# Patient Record
Sex: Male | Born: 1945 | Hispanic: No | Smoking: Current some day smoker
Health system: Southern US, Community
[De-identification: ages and names within clinical notes are randomized; demographics above are authoritative.]

## PROBLEM LIST (undated history)

## (undated) DIAGNOSIS — F32A Depression, unspecified: Secondary | ICD-10-CM

## (undated) DIAGNOSIS — N183 Chronic kidney disease, stage 3 unspecified: Secondary | ICD-10-CM

## (undated) DIAGNOSIS — K56609 Unspecified intestinal obstruction, unspecified as to partial versus complete obstruction: Secondary | ICD-10-CM

## (undated) DIAGNOSIS — I8290 Acute embolism and thrombosis of unspecified vein: Secondary | ICD-10-CM

## (undated) DIAGNOSIS — E0590 Thyrotoxicosis, unspecified without thyrotoxic crisis or storm: Secondary | ICD-10-CM

## (undated) DIAGNOSIS — E46 Unspecified protein-calorie malnutrition: Secondary | ICD-10-CM

## (undated) DIAGNOSIS — Z91199 Patient's noncompliance with other medical treatment and regimen due to unspecified reason: Secondary | ICD-10-CM

## (undated) DIAGNOSIS — M87051 Idiopathic aseptic necrosis of right femur: Secondary | ICD-10-CM

## (undated) DIAGNOSIS — R7881 Bacteremia: Secondary | ICD-10-CM

## (undated) DIAGNOSIS — E785 Hyperlipidemia, unspecified: Secondary | ICD-10-CM

## (undated) DIAGNOSIS — I4821 Permanent atrial fibrillation: Secondary | ICD-10-CM

## (undated) DIAGNOSIS — I4891 Unspecified atrial fibrillation: Secondary | ICD-10-CM

## (undated) DIAGNOSIS — F319 Bipolar disorder, unspecified: Secondary | ICD-10-CM

## (undated) DIAGNOSIS — Z9119 Patient's noncompliance with other medical treatment and regimen: Secondary | ICD-10-CM

## (undated) DIAGNOSIS — I10 Essential (primary) hypertension: Secondary | ICD-10-CM

## (undated) DIAGNOSIS — N529 Male erectile dysfunction, unspecified: Secondary | ICD-10-CM

## (undated) DIAGNOSIS — F419 Anxiety disorder, unspecified: Secondary | ICD-10-CM

## (undated) DIAGNOSIS — R6251 Failure to thrive (child): Secondary | ICD-10-CM

## (undated) DIAGNOSIS — D696 Thrombocytopenia, unspecified: Secondary | ICD-10-CM

## (undated) DIAGNOSIS — J449 Chronic obstructive pulmonary disease, unspecified: Secondary | ICD-10-CM

## (undated) DIAGNOSIS — I428 Other cardiomyopathies: Secondary | ICD-10-CM

## (undated) DIAGNOSIS — R911 Solitary pulmonary nodule: Secondary | ICD-10-CM

## (undated) DIAGNOSIS — Z77090 Contact with and (suspected) exposure to asbestos: Secondary | ICD-10-CM

## (undated) DIAGNOSIS — N433 Hydrocele, unspecified: Secondary | ICD-10-CM

## (undated) DIAGNOSIS — J189 Pneumonia, unspecified organism: Secondary | ICD-10-CM

## (undated) DIAGNOSIS — Z9981 Dependence on supplemental oxygen: Secondary | ICD-10-CM

## (undated) DIAGNOSIS — K219 Gastro-esophageal reflux disease without esophagitis: Secondary | ICD-10-CM

## (undated) DIAGNOSIS — R634 Abnormal weight loss: Secondary | ICD-10-CM

## (undated) DIAGNOSIS — I5022 Chronic systolic (congestive) heart failure: Secondary | ICD-10-CM

## (undated) DIAGNOSIS — J929 Pleural plaque without asbestos: Secondary | ICD-10-CM

## (undated) DIAGNOSIS — B9620 Unspecified Escherichia coli [E. coli] as the cause of diseases classified elsewhere: Secondary | ICD-10-CM

## (undated) DIAGNOSIS — I82409 Acute embolism and thrombosis of unspecified deep veins of unspecified lower extremity: Secondary | ICD-10-CM

## (undated) DIAGNOSIS — M87052 Idiopathic aseptic necrosis of left femur: Secondary | ICD-10-CM

## (undated) DIAGNOSIS — F1010 Alcohol abuse, uncomplicated: Secondary | ICD-10-CM

## (undated) DIAGNOSIS — M479 Spondylosis, unspecified: Secondary | ICD-10-CM

## (undated) DIAGNOSIS — I495 Sick sinus syndrome: Secondary | ICD-10-CM

## (undated) DIAGNOSIS — I213 ST elevation (STEMI) myocardial infarction of unspecified site: Secondary | ICD-10-CM

## (undated) DIAGNOSIS — F329 Major depressive disorder, single episode, unspecified: Secondary | ICD-10-CM

## (undated) DIAGNOSIS — M1990 Unspecified osteoarthritis, unspecified site: Secondary | ICD-10-CM

## (undated) DIAGNOSIS — K2970 Gastritis, unspecified, without bleeding: Secondary | ICD-10-CM

## (undated) DIAGNOSIS — I81 Portal vein thrombosis: Secondary | ICD-10-CM

## (undated) HISTORY — DX: Bacteremia: R78.81

## (undated) HISTORY — DX: Chronic obstructive pulmonary disease, unspecified: J44.9

## (undated) HISTORY — DX: Idiopathic aseptic necrosis of left femur: M87.052

## (undated) HISTORY — DX: Chronic systolic (congestive) heart failure: I50.22

## (undated) HISTORY — DX: Male erectile dysfunction, unspecified: N52.9

## (undated) HISTORY — DX: Acute embolism and thrombosis of unspecified deep veins of unspecified lower extremity: I82.409

## (undated) HISTORY — DX: Unspecified intestinal obstruction, unspecified as to partial versus complete obstruction: K56.609

## (undated) HISTORY — DX: Hydrocele, unspecified: N43.3

## (undated) HISTORY — DX: Solitary pulmonary nodule: R91.1

## (undated) HISTORY — DX: Alcohol abuse, uncomplicated: F10.10

## (undated) HISTORY — DX: Portal vein thrombosis: I81

## (undated) HISTORY — DX: Thyrotoxicosis, unspecified without thyrotoxic crisis or storm: E05.90

## (undated) HISTORY — DX: Unspecified Escherichia coli (E. coli) as the cause of diseases classified elsewhere: B96.20

## (undated) HISTORY — DX: Abnormal weight loss: R63.4

## (undated) HISTORY — DX: Major depressive disorder, single episode, unspecified: F32.9

## (undated) HISTORY — DX: Unspecified osteoarthritis, unspecified site: M19.90

## (undated) HISTORY — DX: Depression, unspecified: F32.A

## (undated) HISTORY — DX: Gastro-esophageal reflux disease without esophagitis: K21.9

## (undated) HISTORY — PX: JOINT REPLACEMENT: SHX530

## (undated) HISTORY — DX: Essential (primary) hypertension: I10

## (undated) HISTORY — DX: Pleural plaque without asbestos: J92.9

## (undated) HISTORY — DX: Sick sinus syndrome: I49.5

## (undated) HISTORY — DX: Spondylosis, unspecified: M47.9

## (undated) HISTORY — DX: Bipolar disorder, unspecified: F31.9

## (undated) HISTORY — DX: Idiopathic aseptic necrosis of right femur: M87.051

## (undated) HISTORY — DX: Gastritis, unspecified, without bleeding: K29.70

---

## 1997-12-05 ENCOUNTER — Encounter: Admission: RE | Admit: 1997-12-05 | Discharge: 1997-12-05 | Payer: Self-pay | Admitting: Internal Medicine

## 1997-12-12 ENCOUNTER — Encounter: Admission: RE | Admit: 1997-12-12 | Discharge: 1997-12-12 | Payer: Self-pay | Admitting: Internal Medicine

## 1998-11-07 ENCOUNTER — Inpatient Hospital Stay (HOSPITAL_COMMUNITY): Admission: AD | Admit: 1998-11-07 | Discharge: 1998-11-14 | Payer: Self-pay | Admitting: Internal Medicine

## 1998-11-07 ENCOUNTER — Encounter: Admission: RE | Admit: 1998-11-07 | Discharge: 1998-11-07 | Payer: Self-pay | Admitting: Internal Medicine

## 1998-11-07 ENCOUNTER — Ambulatory Visit (HOSPITAL_COMMUNITY): Admission: RE | Admit: 1998-11-07 | Discharge: 1998-11-07 | Payer: Self-pay | Admitting: Internal Medicine

## 1998-11-07 ENCOUNTER — Encounter: Payer: Self-pay | Admitting: Internal Medicine

## 1998-11-08 ENCOUNTER — Encounter: Payer: Self-pay | Admitting: Internal Medicine

## 1998-11-10 ENCOUNTER — Encounter: Payer: Self-pay | Admitting: Internal Medicine

## 1998-12-04 ENCOUNTER — Encounter: Admission: RE | Admit: 1998-12-04 | Discharge: 1998-12-04 | Payer: Self-pay | Admitting: Internal Medicine

## 1998-12-07 ENCOUNTER — Encounter: Admission: RE | Admit: 1998-12-07 | Discharge: 1998-12-07 | Payer: Self-pay | Admitting: Hematology and Oncology

## 1998-12-14 ENCOUNTER — Encounter: Admission: RE | Admit: 1998-12-14 | Discharge: 1998-12-14 | Payer: Self-pay | Admitting: Internal Medicine

## 1998-12-27 ENCOUNTER — Encounter: Admission: RE | Admit: 1998-12-27 | Discharge: 1998-12-27 | Payer: Self-pay | Admitting: Internal Medicine

## 1999-01-04 ENCOUNTER — Encounter: Admission: RE | Admit: 1999-01-04 | Discharge: 1999-01-04 | Payer: Self-pay | Admitting: Hematology and Oncology

## 1999-01-30 ENCOUNTER — Encounter: Admission: RE | Admit: 1999-01-30 | Discharge: 1999-01-30 | Payer: Self-pay | Admitting: Internal Medicine

## 1999-01-31 ENCOUNTER — Encounter: Admission: RE | Admit: 1999-01-31 | Discharge: 1999-01-31 | Payer: Self-pay | Admitting: Internal Medicine

## 1999-03-02 ENCOUNTER — Encounter: Admission: RE | Admit: 1999-03-02 | Discharge: 1999-03-02 | Payer: Self-pay | Admitting: Internal Medicine

## 1999-03-22 ENCOUNTER — Encounter: Admission: RE | Admit: 1999-03-22 | Discharge: 1999-03-22 | Payer: Self-pay | Admitting: Hematology and Oncology

## 1999-04-02 ENCOUNTER — Encounter: Admission: RE | Admit: 1999-04-02 | Discharge: 1999-04-02 | Payer: Self-pay | Admitting: Internal Medicine

## 1999-05-07 ENCOUNTER — Encounter: Admission: RE | Admit: 1999-05-07 | Discharge: 1999-05-07 | Payer: Self-pay | Admitting: Internal Medicine

## 1999-08-13 ENCOUNTER — Ambulatory Visit (HOSPITAL_COMMUNITY): Admission: RE | Admit: 1999-08-13 | Discharge: 1999-08-13 | Payer: Self-pay | Admitting: *Deleted

## 1999-08-13 ENCOUNTER — Encounter: Admission: RE | Admit: 1999-08-13 | Discharge: 1999-08-13 | Payer: Self-pay | Admitting: Internal Medicine

## 1999-10-10 ENCOUNTER — Encounter: Admission: RE | Admit: 1999-10-10 | Discharge: 1999-10-10 | Payer: Self-pay | Admitting: Hematology and Oncology

## 1999-11-05 ENCOUNTER — Encounter: Admission: RE | Admit: 1999-11-05 | Discharge: 1999-11-05 | Payer: Self-pay | Admitting: Hematology and Oncology

## 1999-11-14 ENCOUNTER — Encounter: Admission: RE | Admit: 1999-11-14 | Discharge: 1999-11-14 | Payer: Self-pay | Admitting: Internal Medicine

## 1999-11-22 ENCOUNTER — Encounter: Admission: RE | Admit: 1999-11-22 | Discharge: 1999-11-22 | Payer: Self-pay | Admitting: Hematology and Oncology

## 2000-02-27 ENCOUNTER — Encounter: Admission: RE | Admit: 2000-02-27 | Discharge: 2000-02-27 | Payer: Self-pay

## 2000-03-18 ENCOUNTER — Encounter: Admission: RE | Admit: 2000-03-18 | Discharge: 2000-03-18 | Payer: Self-pay | Admitting: Internal Medicine

## 2000-03-18 ENCOUNTER — Ambulatory Visit (HOSPITAL_COMMUNITY): Admission: RE | Admit: 2000-03-18 | Discharge: 2000-03-18 | Payer: Self-pay | Admitting: Internal Medicine

## 2000-06-12 ENCOUNTER — Ambulatory Visit (HOSPITAL_COMMUNITY): Admission: RE | Admit: 2000-06-12 | Discharge: 2000-06-12 | Payer: Self-pay | Admitting: Internal Medicine

## 2000-06-12 ENCOUNTER — Encounter: Admission: RE | Admit: 2000-06-12 | Discharge: 2000-06-12 | Payer: Self-pay | Admitting: Internal Medicine

## 2000-08-20 ENCOUNTER — Encounter: Payer: Self-pay | Admitting: Internal Medicine

## 2000-08-20 ENCOUNTER — Inpatient Hospital Stay (HOSPITAL_COMMUNITY): Admission: EM | Admit: 2000-08-20 | Discharge: 2000-08-29 | Payer: Self-pay | Admitting: Emergency Medicine

## 2000-08-21 ENCOUNTER — Encounter: Payer: Self-pay | Admitting: Internal Medicine

## 2000-08-22 ENCOUNTER — Encounter: Payer: Self-pay | Admitting: Cardiovascular Disease

## 2000-09-04 ENCOUNTER — Encounter: Admission: RE | Admit: 2000-09-04 | Discharge: 2000-09-04 | Payer: Self-pay

## 2000-09-18 ENCOUNTER — Encounter: Admission: RE | Admit: 2000-09-18 | Discharge: 2000-09-18 | Payer: Self-pay | Admitting: Internal Medicine

## 2000-10-29 ENCOUNTER — Encounter: Admission: RE | Admit: 2000-10-29 | Discharge: 2000-10-29 | Payer: Self-pay | Admitting: Internal Medicine

## 2000-10-31 ENCOUNTER — Encounter: Admission: RE | Admit: 2000-10-31 | Discharge: 2000-10-31 | Payer: Self-pay | Admitting: Thoracic Surgery

## 2000-10-31 ENCOUNTER — Encounter: Payer: Self-pay | Admitting: Thoracic Surgery

## 2000-11-10 ENCOUNTER — Encounter: Admission: RE | Admit: 2000-11-10 | Discharge: 2000-11-10 | Payer: Self-pay | Admitting: Internal Medicine

## 2000-11-11 ENCOUNTER — Encounter: Admission: RE | Admit: 2000-11-11 | Discharge: 2000-11-11 | Payer: Self-pay | Admitting: Internal Medicine

## 2000-11-24 ENCOUNTER — Encounter: Admission: RE | Admit: 2000-11-24 | Discharge: 2000-11-24 | Payer: Self-pay | Admitting: Internal Medicine

## 2000-12-15 ENCOUNTER — Encounter: Admission: RE | Admit: 2000-12-15 | Discharge: 2000-12-15 | Payer: Self-pay | Admitting: *Deleted

## 2000-12-22 ENCOUNTER — Encounter: Admission: RE | Admit: 2000-12-22 | Discharge: 2000-12-22 | Payer: Self-pay | Admitting: Internal Medicine

## 2001-01-22 ENCOUNTER — Encounter: Admission: RE | Admit: 2001-01-22 | Discharge: 2001-01-22 | Payer: Self-pay | Admitting: Internal Medicine

## 2001-01-22 ENCOUNTER — Emergency Department (HOSPITAL_COMMUNITY): Admission: EM | Admit: 2001-01-22 | Discharge: 2001-01-22 | Payer: Self-pay | Admitting: *Deleted

## 2001-01-23 ENCOUNTER — Encounter: Admission: RE | Admit: 2001-01-23 | Discharge: 2001-01-23 | Payer: Self-pay | Admitting: Internal Medicine

## 2001-02-16 ENCOUNTER — Encounter: Admission: RE | Admit: 2001-02-16 | Discharge: 2001-02-16 | Payer: Self-pay | Admitting: Internal Medicine

## 2001-03-19 ENCOUNTER — Encounter: Admission: RE | Admit: 2001-03-19 | Discharge: 2001-03-19 | Payer: Self-pay

## 2001-04-13 ENCOUNTER — Encounter: Admission: RE | Admit: 2001-04-13 | Discharge: 2001-04-13 | Payer: Self-pay | Admitting: Internal Medicine

## 2001-04-20 ENCOUNTER — Encounter: Admission: RE | Admit: 2001-04-20 | Discharge: 2001-04-20 | Payer: Self-pay | Admitting: Internal Medicine

## 2001-05-20 HISTORY — PX: CARDIAC CATHETERIZATION: SHX172

## 2001-07-20 ENCOUNTER — Encounter: Admission: RE | Admit: 2001-07-20 | Discharge: 2001-07-20 | Payer: Self-pay | Admitting: Internal Medicine

## 2001-08-21 ENCOUNTER — Encounter: Admission: RE | Admit: 2001-08-21 | Discharge: 2001-08-21 | Payer: Self-pay | Admitting: Internal Medicine

## 2001-08-24 ENCOUNTER — Encounter: Admission: RE | Admit: 2001-08-24 | Discharge: 2001-08-24 | Payer: Self-pay | Admitting: Internal Medicine

## 2001-08-26 ENCOUNTER — Encounter: Admission: RE | Admit: 2001-08-26 | Discharge: 2001-08-26 | Payer: Self-pay | Admitting: Internal Medicine

## 2001-09-10 ENCOUNTER — Encounter: Admission: RE | Admit: 2001-09-10 | Discharge: 2001-09-10 | Payer: Self-pay | Admitting: Internal Medicine

## 2001-09-23 ENCOUNTER — Encounter: Admission: RE | Admit: 2001-09-23 | Discharge: 2001-09-23 | Payer: Self-pay

## 2001-10-01 ENCOUNTER — Encounter: Admission: RE | Admit: 2001-10-01 | Discharge: 2001-10-01 | Payer: Self-pay | Admitting: Internal Medicine

## 2001-10-08 ENCOUNTER — Encounter: Admission: RE | Admit: 2001-10-08 | Discharge: 2001-10-08 | Payer: Self-pay | Admitting: Internal Medicine

## 2001-11-02 ENCOUNTER — Encounter: Admission: RE | Admit: 2001-11-02 | Discharge: 2001-11-02 | Payer: Self-pay | Admitting: Internal Medicine

## 2001-11-21 ENCOUNTER — Emergency Department (HOSPITAL_COMMUNITY): Admission: EM | Admit: 2001-11-21 | Discharge: 2001-11-21 | Payer: Self-pay | Admitting: Emergency Medicine

## 2001-11-21 ENCOUNTER — Encounter: Payer: Self-pay | Admitting: Emergency Medicine

## 2001-11-23 ENCOUNTER — Encounter: Admission: RE | Admit: 2001-11-23 | Discharge: 2001-11-23 | Payer: Self-pay | Admitting: Internal Medicine

## 2001-12-07 ENCOUNTER — Encounter: Admission: RE | Admit: 2001-12-07 | Discharge: 2001-12-07 | Payer: Self-pay | Admitting: Internal Medicine

## 2001-12-10 ENCOUNTER — Inpatient Hospital Stay (HOSPITAL_COMMUNITY): Admission: EM | Admit: 2001-12-10 | Discharge: 2001-12-14 | Payer: Self-pay | Admitting: Emergency Medicine

## 2001-12-10 ENCOUNTER — Encounter: Payer: Self-pay | Admitting: *Deleted

## 2001-12-10 ENCOUNTER — Encounter: Payer: Self-pay | Admitting: Internal Medicine

## 2001-12-11 ENCOUNTER — Encounter (INDEPENDENT_AMBULATORY_CARE_PROVIDER_SITE_OTHER): Payer: Self-pay | Admitting: Cardiovascular Disease

## 2001-12-12 ENCOUNTER — Encounter (INDEPENDENT_AMBULATORY_CARE_PROVIDER_SITE_OTHER): Payer: Self-pay | Admitting: Cardiovascular Disease

## 2001-12-21 ENCOUNTER — Encounter: Admission: RE | Admit: 2001-12-21 | Discharge: 2001-12-21 | Payer: Self-pay | Admitting: Internal Medicine

## 2002-01-04 ENCOUNTER — Encounter: Admission: RE | Admit: 2002-01-04 | Discharge: 2002-01-04 | Payer: Self-pay | Admitting: Internal Medicine

## 2002-01-08 ENCOUNTER — Encounter: Admission: RE | Admit: 2002-01-08 | Discharge: 2002-01-08 | Payer: Self-pay | Admitting: Internal Medicine

## 2002-01-15 ENCOUNTER — Encounter: Admission: RE | Admit: 2002-01-15 | Discharge: 2002-01-15 | Payer: Self-pay | Admitting: Internal Medicine

## 2002-01-28 ENCOUNTER — Encounter: Admission: RE | Admit: 2002-01-28 | Discharge: 2002-01-28 | Payer: Self-pay | Admitting: Internal Medicine

## 2002-02-08 ENCOUNTER — Encounter: Admission: RE | Admit: 2002-02-08 | Discharge: 2002-02-08 | Payer: Self-pay | Admitting: Internal Medicine

## 2002-02-11 ENCOUNTER — Encounter: Admission: RE | Admit: 2002-02-11 | Discharge: 2002-02-11 | Payer: Self-pay | Admitting: Internal Medicine

## 2002-02-15 ENCOUNTER — Encounter: Admission: RE | Admit: 2002-02-15 | Discharge: 2002-02-15 | Payer: Self-pay | Admitting: Internal Medicine

## 2002-02-28 ENCOUNTER — Emergency Department (HOSPITAL_COMMUNITY): Admission: EM | Admit: 2002-02-28 | Discharge: 2002-02-28 | Payer: Self-pay | Admitting: *Deleted

## 2002-03-01 ENCOUNTER — Encounter: Admission: RE | Admit: 2002-03-01 | Discharge: 2002-03-01 | Payer: Self-pay | Admitting: Internal Medicine

## 2002-03-09 ENCOUNTER — Ambulatory Visit (HOSPITAL_COMMUNITY): Admission: RE | Admit: 2002-03-09 | Discharge: 2002-03-09 | Payer: Self-pay | Admitting: Internal Medicine

## 2002-03-09 ENCOUNTER — Inpatient Hospital Stay (HOSPITAL_COMMUNITY): Admission: AD | Admit: 2002-03-09 | Discharge: 2002-03-11 | Payer: Self-pay | Admitting: Internal Medicine

## 2002-03-09 ENCOUNTER — Encounter: Payer: Self-pay | Admitting: Internal Medicine

## 2002-03-09 ENCOUNTER — Encounter: Admission: RE | Admit: 2002-03-09 | Discharge: 2002-03-09 | Payer: Self-pay | Admitting: Internal Medicine

## 2002-03-18 ENCOUNTER — Encounter: Admission: RE | Admit: 2002-03-18 | Discharge: 2002-03-18 | Payer: Self-pay | Admitting: Internal Medicine

## 2002-04-26 ENCOUNTER — Encounter: Admission: RE | Admit: 2002-04-26 | Discharge: 2002-04-26 | Payer: Self-pay | Admitting: Internal Medicine

## 2002-04-29 ENCOUNTER — Encounter: Payer: Self-pay | Admitting: Internal Medicine

## 2002-04-29 ENCOUNTER — Ambulatory Visit (HOSPITAL_COMMUNITY): Admission: RE | Admit: 2002-04-29 | Discharge: 2002-04-29 | Payer: Self-pay | Admitting: Internal Medicine

## 2002-05-03 ENCOUNTER — Encounter: Admission: RE | Admit: 2002-05-03 | Discharge: 2002-05-03 | Payer: Self-pay | Admitting: Internal Medicine

## 2002-05-24 ENCOUNTER — Encounter: Admission: RE | Admit: 2002-05-24 | Discharge: 2002-08-22 | Payer: Self-pay | Admitting: Infectious Diseases

## 2002-07-12 ENCOUNTER — Encounter: Admission: RE | Admit: 2002-07-12 | Discharge: 2002-07-12 | Payer: Self-pay | Admitting: Internal Medicine

## 2002-07-26 ENCOUNTER — Encounter: Admission: RE | Admit: 2002-07-26 | Discharge: 2002-07-26 | Payer: Self-pay | Admitting: Internal Medicine

## 2002-08-19 ENCOUNTER — Encounter: Admission: RE | Admit: 2002-08-19 | Discharge: 2002-08-19 | Payer: Self-pay | Admitting: Internal Medicine

## 2002-08-25 ENCOUNTER — Encounter: Payer: Self-pay | Admitting: Specialist

## 2002-08-25 ENCOUNTER — Ambulatory Visit (HOSPITAL_COMMUNITY): Admission: RE | Admit: 2002-08-25 | Discharge: 2002-08-25 | Payer: Self-pay | Admitting: Specialist

## 2002-09-06 ENCOUNTER — Encounter: Payer: Self-pay | Admitting: Orthopedic Surgery

## 2002-09-08 ENCOUNTER — Encounter: Admission: RE | Admit: 2002-09-08 | Discharge: 2002-09-08 | Payer: Self-pay | Admitting: Internal Medicine

## 2002-09-09 ENCOUNTER — Inpatient Hospital Stay (HOSPITAL_COMMUNITY): Admission: RE | Admit: 2002-09-09 | Discharge: 2002-09-17 | Payer: Self-pay | Admitting: Specialist

## 2002-09-09 HISTORY — PX: HEMIARTHROPLASTY HIP: SUR652

## 2002-09-10 ENCOUNTER — Encounter: Payer: Self-pay | Admitting: Specialist

## 2002-09-12 ENCOUNTER — Encounter: Payer: Self-pay | Admitting: Specialist

## 2002-09-14 ENCOUNTER — Encounter: Payer: Self-pay | Admitting: Specialist

## 2002-09-17 ENCOUNTER — Inpatient Hospital Stay (HOSPITAL_COMMUNITY): Admission: EM | Admit: 2002-09-17 | Discharge: 2002-09-21 | Payer: Self-pay | Admitting: Psychiatry

## 2002-09-30 ENCOUNTER — Encounter: Admission: RE | Admit: 2002-09-30 | Discharge: 2002-09-30 | Payer: Self-pay | Admitting: Internal Medicine

## 2002-10-18 ENCOUNTER — Encounter: Admission: RE | Admit: 2002-10-18 | Discharge: 2002-10-18 | Payer: Self-pay | Admitting: Internal Medicine

## 2002-10-18 ENCOUNTER — Ambulatory Visit (HOSPITAL_COMMUNITY): Admission: RE | Admit: 2002-10-18 | Discharge: 2002-10-18 | Payer: Self-pay | Admitting: Internal Medicine

## 2002-10-18 ENCOUNTER — Encounter: Payer: Self-pay | Admitting: Internal Medicine

## 2002-10-26 ENCOUNTER — Encounter: Admission: RE | Admit: 2002-10-26 | Discharge: 2002-10-26 | Payer: Self-pay | Admitting: Internal Medicine

## 2002-11-29 ENCOUNTER — Encounter: Admission: RE | Admit: 2002-11-29 | Discharge: 2002-11-29 | Payer: Self-pay | Admitting: Internal Medicine

## 2002-12-13 ENCOUNTER — Encounter: Admission: RE | Admit: 2002-12-13 | Discharge: 2002-12-13 | Payer: Self-pay | Admitting: Internal Medicine

## 2003-01-13 ENCOUNTER — Encounter: Admission: RE | Admit: 2003-01-13 | Discharge: 2003-01-13 | Payer: Self-pay | Admitting: Internal Medicine

## 2003-01-31 ENCOUNTER — Encounter: Admission: RE | Admit: 2003-01-31 | Discharge: 2003-01-31 | Payer: Self-pay | Admitting: Internal Medicine

## 2003-02-17 ENCOUNTER — Encounter: Admission: RE | Admit: 2003-02-17 | Discharge: 2003-02-17 | Payer: Self-pay | Admitting: Internal Medicine

## 2003-02-18 HISTORY — PX: INSERT / REPLACE / REMOVE PACEMAKER: SUR710

## 2003-02-22 ENCOUNTER — Encounter: Admission: RE | Admit: 2003-02-22 | Discharge: 2003-02-22 | Payer: Self-pay | Admitting: Internal Medicine

## 2003-02-22 ENCOUNTER — Inpatient Hospital Stay (HOSPITAL_COMMUNITY): Admission: AD | Admit: 2003-02-22 | Discharge: 2003-02-26 | Payer: Self-pay | Admitting: Infectious Diseases

## 2003-02-22 ENCOUNTER — Ambulatory Visit (HOSPITAL_COMMUNITY): Admission: RE | Admit: 2003-02-22 | Discharge: 2003-02-22 | Payer: Self-pay | Admitting: Internal Medicine

## 2003-02-23 ENCOUNTER — Encounter: Payer: Self-pay | Admitting: Cardiology

## 2003-02-25 ENCOUNTER — Encounter: Payer: Self-pay | Admitting: Cardiovascular Disease

## 2003-02-26 ENCOUNTER — Encounter: Payer: Self-pay | Admitting: Infectious Diseases

## 2003-02-28 ENCOUNTER — Encounter: Admission: RE | Admit: 2003-02-28 | Discharge: 2003-02-28 | Payer: Self-pay | Admitting: Internal Medicine

## 2003-03-14 ENCOUNTER — Encounter: Admission: RE | Admit: 2003-03-14 | Discharge: 2003-03-14 | Payer: Self-pay | Admitting: Internal Medicine

## 2003-03-17 ENCOUNTER — Encounter: Admission: RE | Admit: 2003-03-17 | Discharge: 2003-03-17 | Payer: Self-pay | Admitting: Internal Medicine

## 2003-03-31 ENCOUNTER — Encounter: Admission: RE | Admit: 2003-03-31 | Discharge: 2003-03-31 | Payer: Self-pay | Admitting: Internal Medicine

## 2003-05-02 ENCOUNTER — Encounter: Admission: RE | Admit: 2003-05-02 | Discharge: 2003-05-02 | Payer: Self-pay | Admitting: Internal Medicine

## 2003-06-02 ENCOUNTER — Encounter: Admission: RE | Admit: 2003-06-02 | Discharge: 2003-06-02 | Payer: Self-pay | Admitting: Internal Medicine

## 2003-06-24 ENCOUNTER — Encounter: Admission: RE | Admit: 2003-06-24 | Discharge: 2003-06-24 | Payer: Self-pay | Admitting: Internal Medicine

## 2003-06-27 ENCOUNTER — Encounter: Admission: RE | Admit: 2003-06-27 | Discharge: 2003-06-27 | Payer: Self-pay | Admitting: Internal Medicine

## 2003-07-25 ENCOUNTER — Encounter: Admission: RE | Admit: 2003-07-25 | Discharge: 2003-07-25 | Payer: Self-pay | Admitting: Internal Medicine

## 2003-08-19 ENCOUNTER — Inpatient Hospital Stay (HOSPITAL_COMMUNITY): Admission: EM | Admit: 2003-08-19 | Discharge: 2003-08-20 | Payer: Self-pay | Admitting: Emergency Medicine

## 2003-08-22 ENCOUNTER — Encounter: Admission: RE | Admit: 2003-08-22 | Discharge: 2003-08-22 | Payer: Self-pay | Admitting: Internal Medicine

## 2003-08-24 ENCOUNTER — Ambulatory Visit (HOSPITAL_COMMUNITY): Admission: RE | Admit: 2003-08-24 | Discharge: 2003-08-24 | Payer: Self-pay | Admitting: Internal Medicine

## 2003-09-21 ENCOUNTER — Encounter: Admission: RE | Admit: 2003-09-21 | Discharge: 2003-09-21 | Payer: Self-pay | Admitting: Internal Medicine

## 2003-09-26 ENCOUNTER — Encounter: Admission: RE | Admit: 2003-09-26 | Discharge: 2003-09-26 | Payer: Self-pay | Admitting: Internal Medicine

## 2003-09-27 ENCOUNTER — Emergency Department (HOSPITAL_COMMUNITY): Admission: EM | Admit: 2003-09-27 | Discharge: 2003-09-27 | Payer: Self-pay | Admitting: Family Medicine

## 2003-09-29 ENCOUNTER — Ambulatory Visit (HOSPITAL_COMMUNITY): Admission: RE | Admit: 2003-09-29 | Discharge: 2003-09-29 | Payer: Self-pay | Admitting: Internal Medicine

## 2003-10-10 ENCOUNTER — Inpatient Hospital Stay (HOSPITAL_COMMUNITY): Admission: RE | Admit: 2003-10-10 | Discharge: 2003-10-13 | Payer: Self-pay | Admitting: Internal Medicine

## 2003-10-10 ENCOUNTER — Encounter: Admission: RE | Admit: 2003-10-10 | Discharge: 2003-10-10 | Payer: Self-pay | Admitting: Internal Medicine

## 2003-10-19 ENCOUNTER — Encounter: Admission: RE | Admit: 2003-10-19 | Discharge: 2003-10-19 | Payer: Self-pay | Admitting: Internal Medicine

## 2003-10-24 ENCOUNTER — Encounter: Admission: RE | Admit: 2003-10-24 | Discharge: 2003-10-24 | Payer: Self-pay | Admitting: Internal Medicine

## 2003-10-28 ENCOUNTER — Encounter: Admission: RE | Admit: 2003-10-28 | Discharge: 2003-10-28 | Payer: Self-pay | Admitting: Internal Medicine

## 2003-11-14 ENCOUNTER — Encounter: Admission: RE | Admit: 2003-11-14 | Discharge: 2003-11-14 | Payer: Self-pay | Admitting: Internal Medicine

## 2003-12-12 ENCOUNTER — Encounter: Admission: RE | Admit: 2003-12-12 | Discharge: 2003-12-12 | Payer: Self-pay | Admitting: Internal Medicine

## 2003-12-21 ENCOUNTER — Encounter: Admission: RE | Admit: 2003-12-21 | Discharge: 2003-12-21 | Payer: Self-pay | Admitting: Internal Medicine

## 2004-02-06 ENCOUNTER — Ambulatory Visit: Payer: Self-pay | Admitting: Internal Medicine

## 2004-02-17 ENCOUNTER — Ambulatory Visit: Payer: Self-pay | Admitting: Internal Medicine

## 2004-03-01 ENCOUNTER — Ambulatory Visit (HOSPITAL_COMMUNITY): Admission: RE | Admit: 2004-03-01 | Discharge: 2004-03-01 | Payer: Self-pay | Admitting: Internal Medicine

## 2004-03-02 ENCOUNTER — Ambulatory Visit: Payer: Self-pay | Admitting: Internal Medicine

## 2004-03-06 ENCOUNTER — Ambulatory Visit: Payer: Self-pay | Admitting: Internal Medicine

## 2004-03-07 ENCOUNTER — Ambulatory Visit (HOSPITAL_COMMUNITY): Admission: RE | Admit: 2004-03-07 | Discharge: 2004-03-07 | Payer: Self-pay | Admitting: Internal Medicine

## 2004-03-26 ENCOUNTER — Ambulatory Visit: Payer: Self-pay | Admitting: Internal Medicine

## 2004-03-29 ENCOUNTER — Ambulatory Visit: Payer: Self-pay | Admitting: Internal Medicine

## 2004-04-02 ENCOUNTER — Ambulatory Visit: Payer: Self-pay | Admitting: Internal Medicine

## 2004-04-09 ENCOUNTER — Ambulatory Visit: Payer: Self-pay | Admitting: Internal Medicine

## 2004-04-10 ENCOUNTER — Ambulatory Visit: Payer: Self-pay | Admitting: Internal Medicine

## 2004-04-25 ENCOUNTER — Ambulatory Visit: Payer: Self-pay | Admitting: Internal Medicine

## 2004-04-25 ENCOUNTER — Ambulatory Visit (HOSPITAL_COMMUNITY): Admission: RE | Admit: 2004-04-25 | Discharge: 2004-04-25 | Payer: Self-pay | Admitting: Internal Medicine

## 2004-06-01 ENCOUNTER — Ambulatory Visit: Payer: Self-pay | Admitting: Internal Medicine

## 2004-07-18 ENCOUNTER — Ambulatory Visit: Payer: Self-pay | Admitting: Internal Medicine

## 2004-09-21 ENCOUNTER — Ambulatory Visit: Payer: Self-pay | Admitting: Internal Medicine

## 2004-09-21 ENCOUNTER — Ambulatory Visit (HOSPITAL_COMMUNITY): Admission: RE | Admit: 2004-09-21 | Discharge: 2004-09-21 | Payer: Self-pay | Admitting: Internal Medicine

## 2004-09-28 ENCOUNTER — Ambulatory Visit: Payer: Self-pay | Admitting: Internal Medicine

## 2004-10-11 ENCOUNTER — Ambulatory Visit: Payer: Self-pay | Admitting: Internal Medicine

## 2004-11-09 IMAGING — CR DG ABDOMEN ACUTE W/ 1V CHEST
3 series · 3 of 3 positions shown · non-contrast
Comparison: 02/26/03.

CLINICAL DATA: 57-year-old male with shortness of breath and chills.  
 ACUTE ABDOMINAL SERIES WITH CHEST RADIOGRAPH

[view not recorded (1 of 3)]
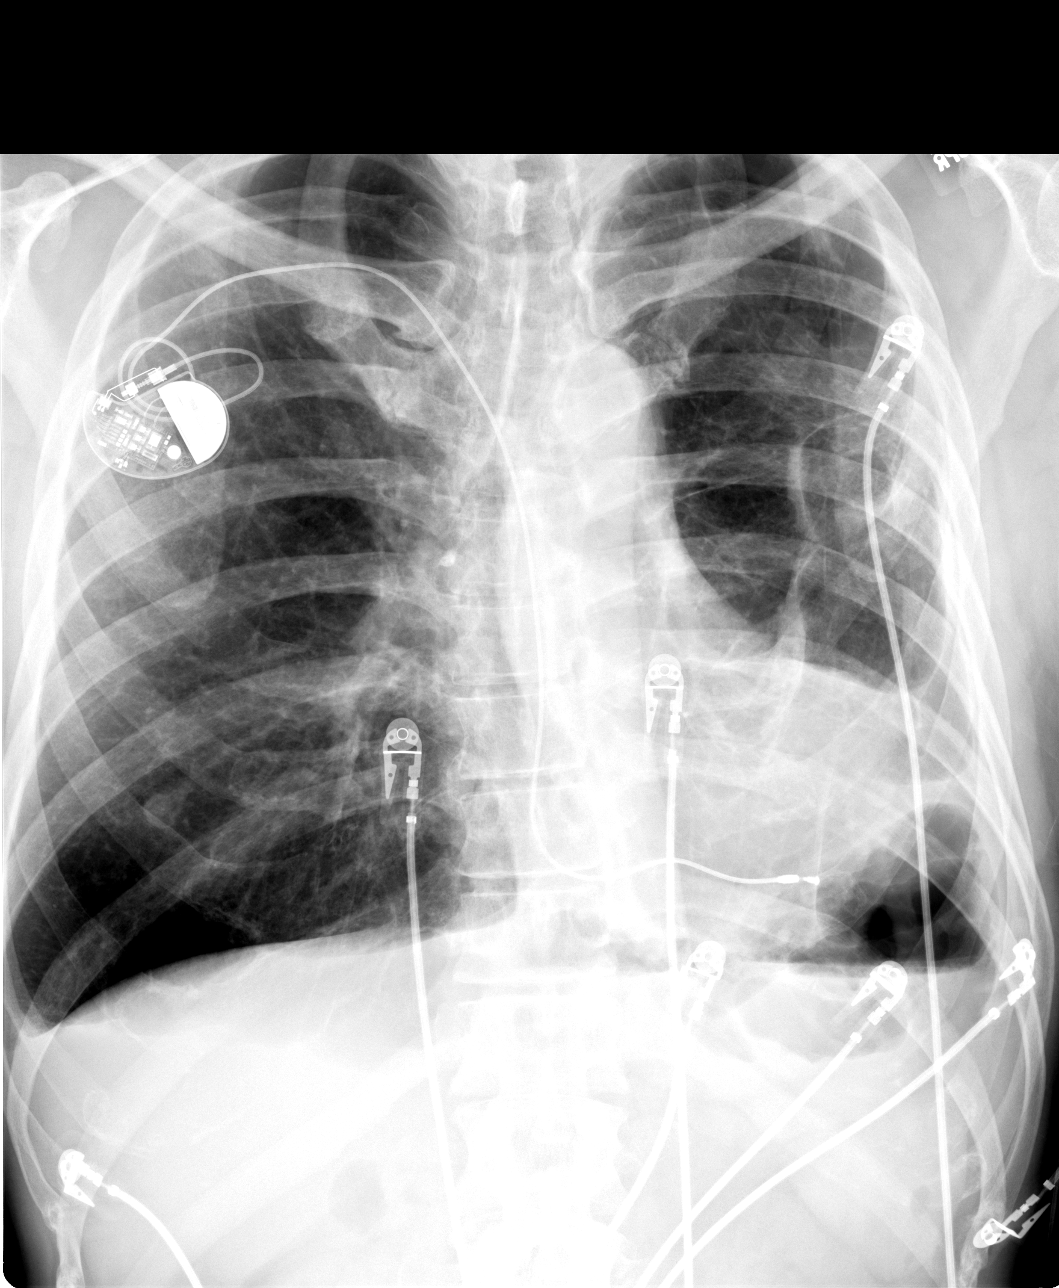

[view not recorded (2 of 3)]
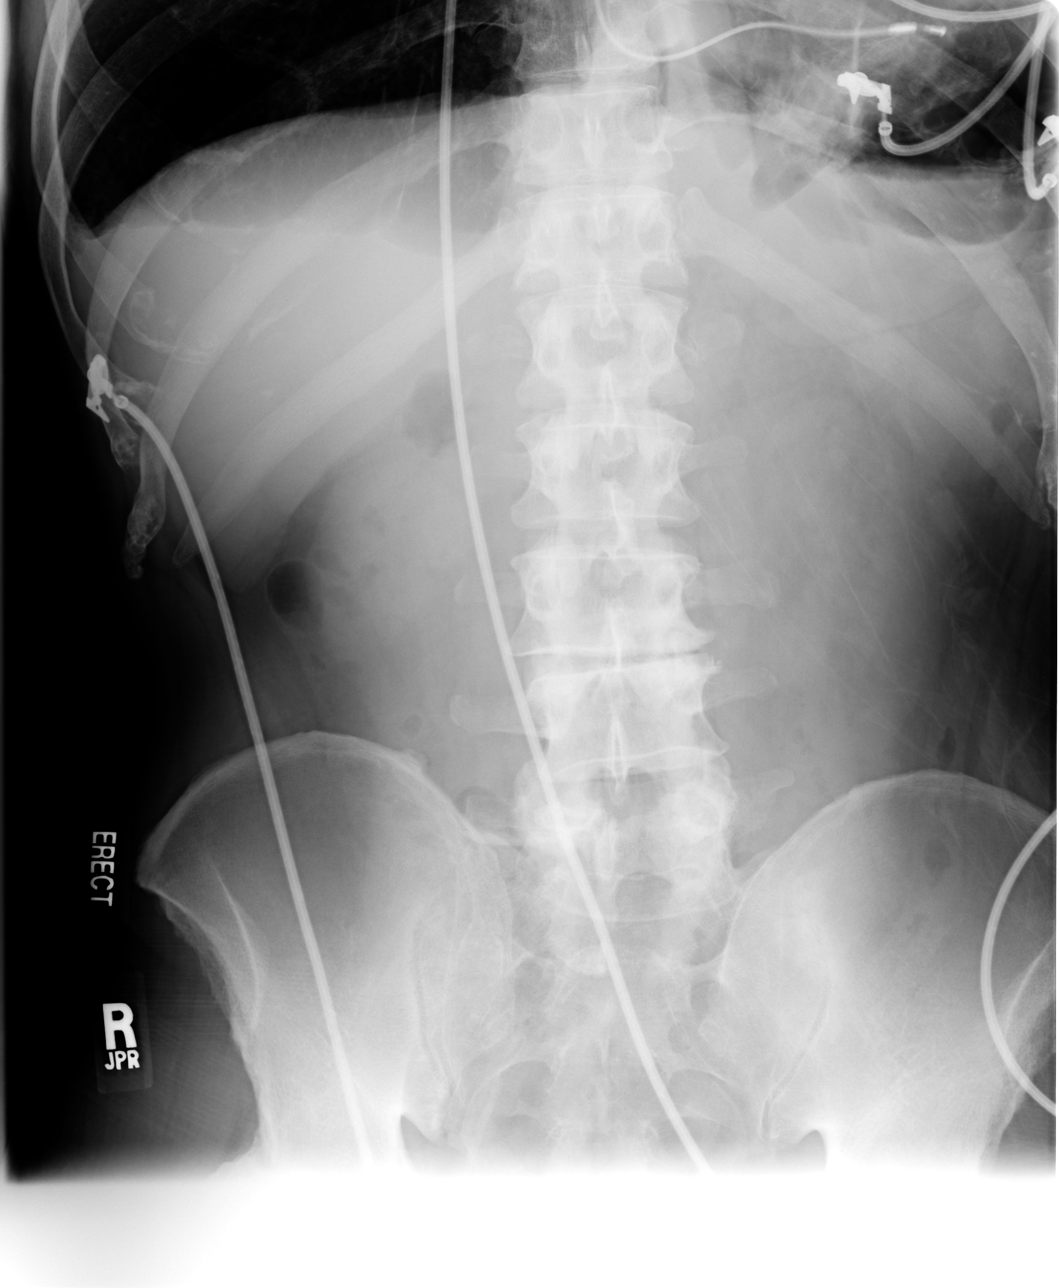

[view not recorded (3 of 3)]
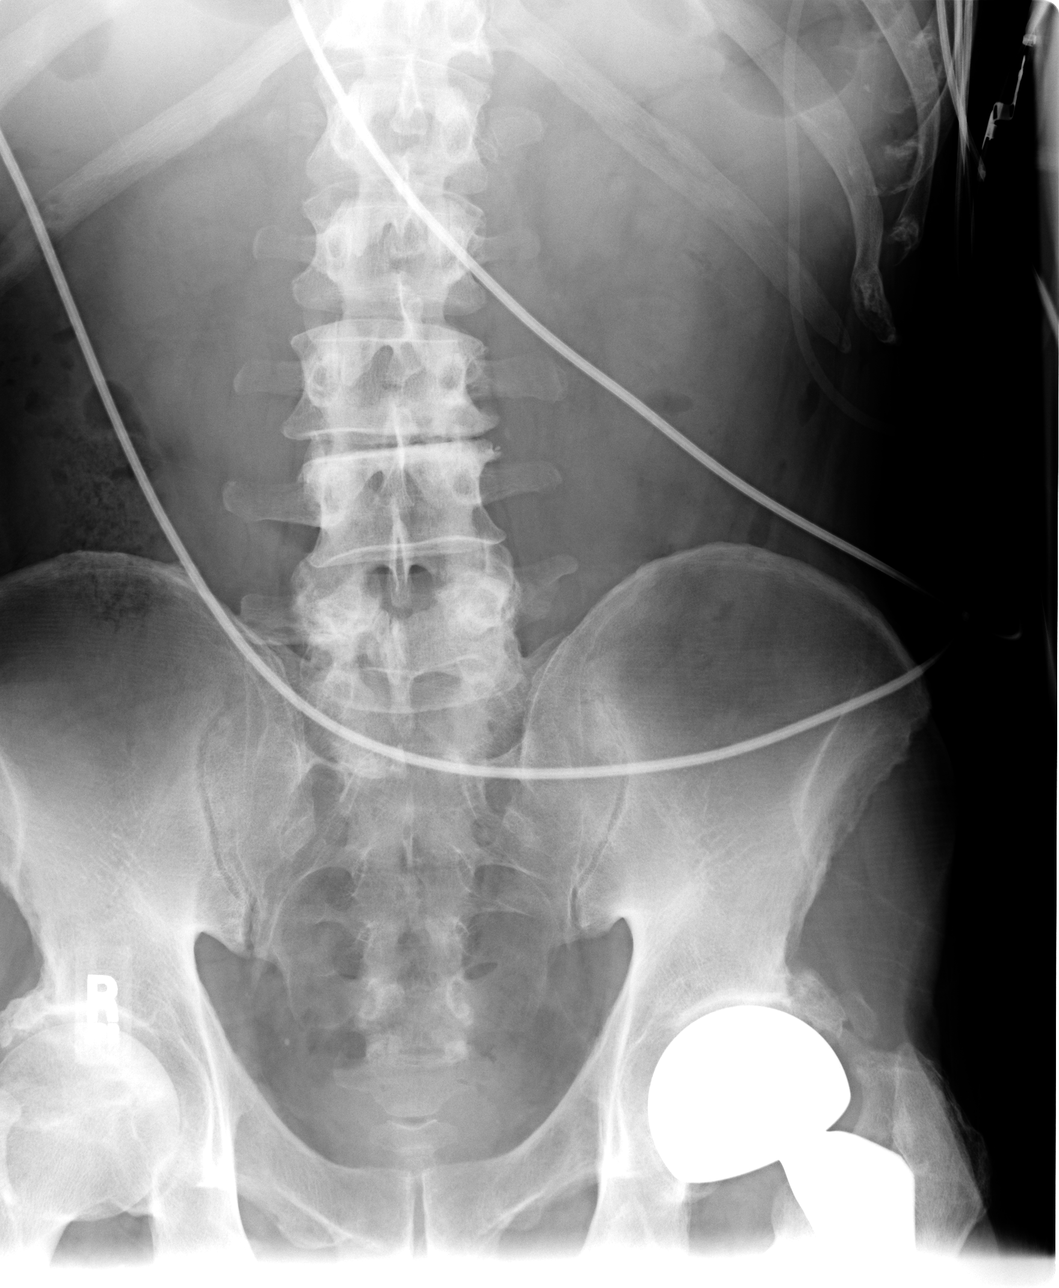

[3 of 3 positions shown; findings below may reference images not displayed]

FINDINGS: Chronic pleuroparenchymal changes are evident in the left mid and lower chest with pleural thickening and an air fluid level in the left lung base.  This could represent a chronic loculated hydropneumothorax.  There is background emphysema.  The right lung is hyperinflated.  There is a stable nodule in the right lower lobe.  No acute infiltrate or airspace disease.  Right subclavian single lead pacemaker.  
 IMPRESSION
 Stable chronic changes in the left lung and right lower lobe nodule.  No superimposed acute disease. 
 Background COPD. 
 Abdomen:  There is a relative paucity of bowel gas.  Stool and air are evident in the colon on the right.  No evidence of bowel obstruction.  Left hip prosthesis with surrounding heterotopic bone.  Right hip osteoarthritis.  Degenerative changes are present in the spine. 
 IMPRESSION
 No evidence of bowel obstruction. 
 Status post left total hip arthroplasty.  
 Degenerative changes of the spine and right hip.

## 2004-11-10 IMAGING — CT CT PELVIS W/O CM
1 series · 15 of 32 positions shown, 19 images · IV contrast (GASTRO)
Comparison: none

CLINICAL DATA: 57-year-old male with abdominal pain.  History of portal vein and superior mesenteric venous thrombosis.  
CT SCAN OF THE ABDOMEN AND PELVIS WITHOUT IV CONTRAST, ORAL CONTRAST WAS ADMINISTERED
TECHNIQUE: 5 mm collimation was utilized to scan the abdomen and pelvis with oral contrast only.  No IV contrast administered because of creatinine of 2.3.  
CT ABDOMEN
Chronic pleuroparenchymal scarring is evident in the lung bases, left greater than right.  There is some nodularity to the subpleural scarring on the left side with adjacent pleural calcification.  Pacemaker lead is evident in the right side of the heart anteriorly.  The heart is mildly enlarged.  No pericardial fluid.  No significant pleural fluid.  
Within the limits of a noncontrast study, the liver does demonstrate a focal 10 mm hypodensity, image 14, in the right hepatic dome.  This is too small to further characterize on a noncontrast study.  No intrahepatic biliary dilatation.  The gallbladder is grossly normal.  Tortuous vessels are noted along the portal vein region consistent with cavernous transformation from chronic portal vein thrombosis.  The pancreas contour is normal.  The spleen is slightly small.  The kidneys are normal in contour.  No renal calculi or urinary tract obstruction.  No hydronephrosis.  Adrenal glands are unremarkable.  No evidence of bowel obstruction, ascites, perforation, or lymphadenopathy.  No free air.  
IMPRESSION
No acute inflammation or abnormality in the abdomen within the limits of the noncontrast study.  
Chronic lower lobe parenchymal/pleural scarring. 
Right hepatic dome 10 mm nonspecific hypodensity. 
Cavernous transformation of the portal vein related to chronic thrombosis.  
CT PELVIS WITH ORAL CONTRAST ONLY
In the right lower quadrant, the appendix is actually opacified with contrast and has a normal lumen and caliber.  No appendicitis.  Terminal ileum is also normal.  No evidence of bowel obstruction.  No lymphadenopathy.  Left hip prothesis.  No ascites.  L5 bilateral pars interarticularis defects are noted.  
Normal appendix.  No acute inflammation. 
No evidence of abscess.  
L5 pars interarticularis defects.

[Series 2: routine abdomen · axial · 0.79mm/px · z∈[-522,-102]mm · 15 of 93 slices shown, 19 images]
[im 6/93  soft-tissue]
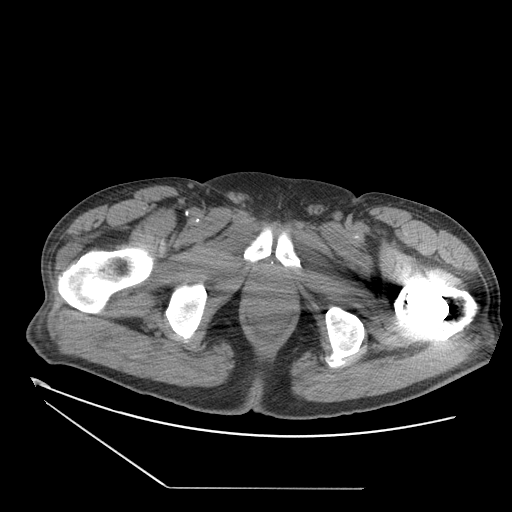
[im 6/93  bone]
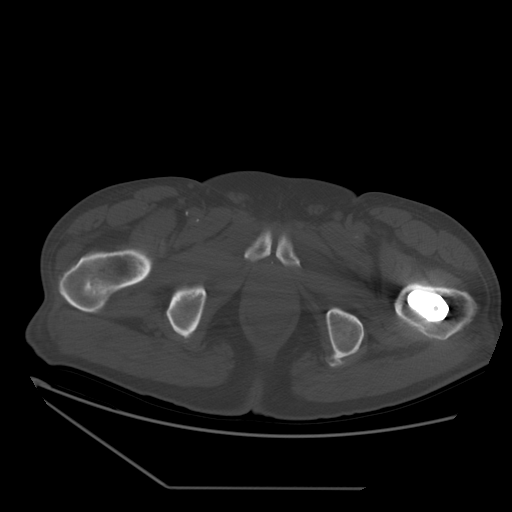
[im 12/93  soft-tissue]
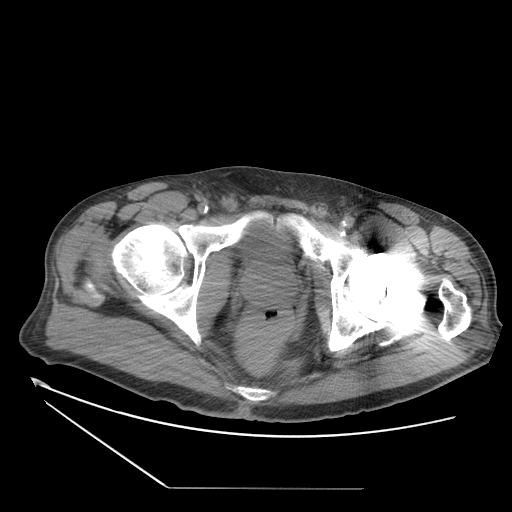
[im 18/93  soft-tissue]
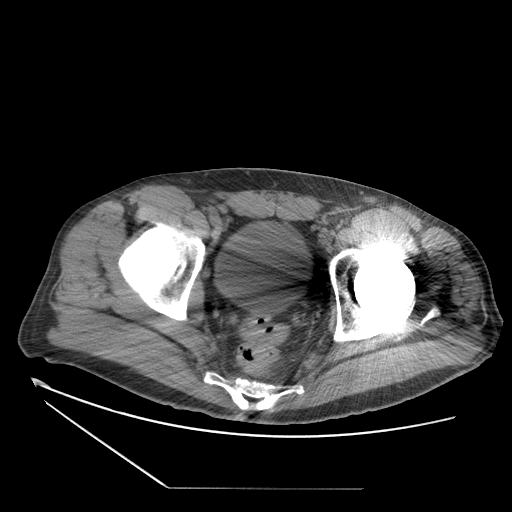
[im 27/93  soft-tissue]
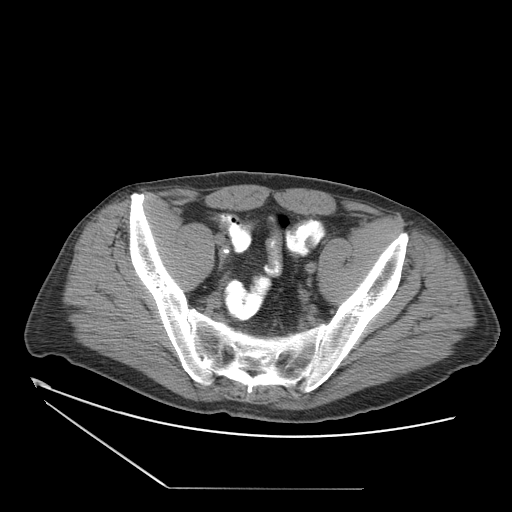
[im 33/93  soft-tissue]
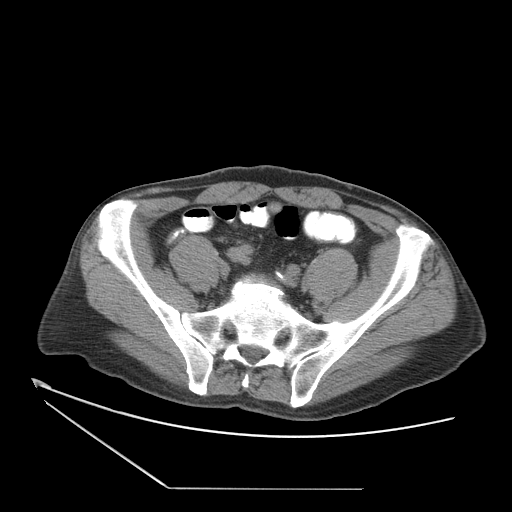
[im 39/93  soft-tissue]
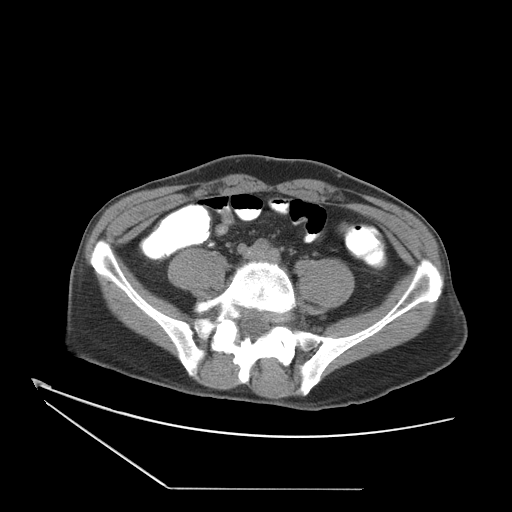
[im 48/93  soft-tissue]
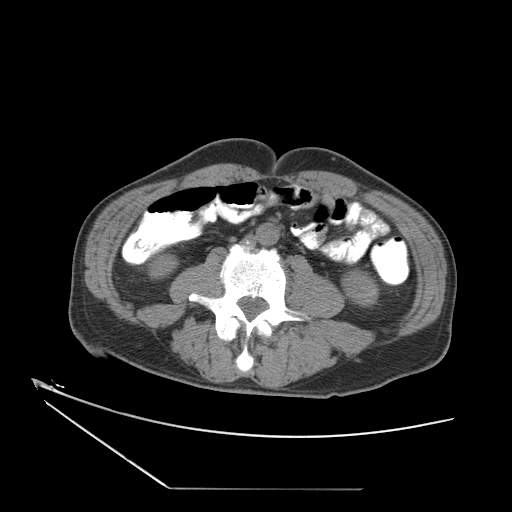
[im 54/93  soft-tissue]
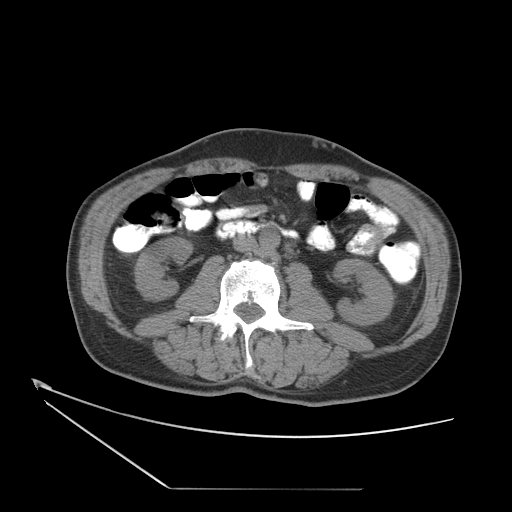
[im 60/93  soft-tissue]
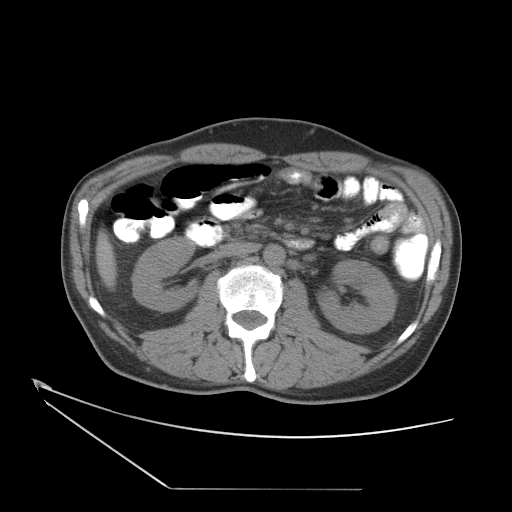
[im 60/93  bone]
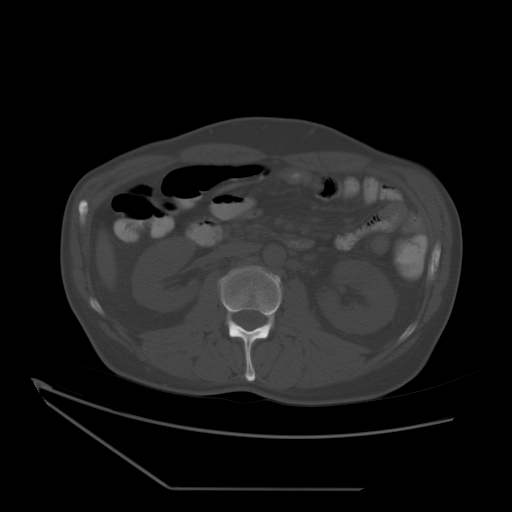
[im 66/93  soft-tissue]
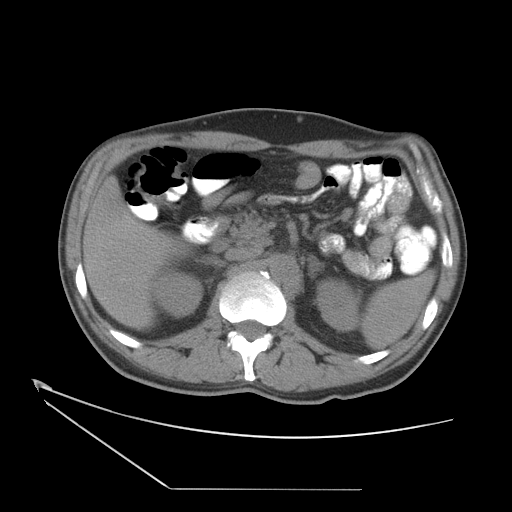
[im 75/93  soft-tissue]
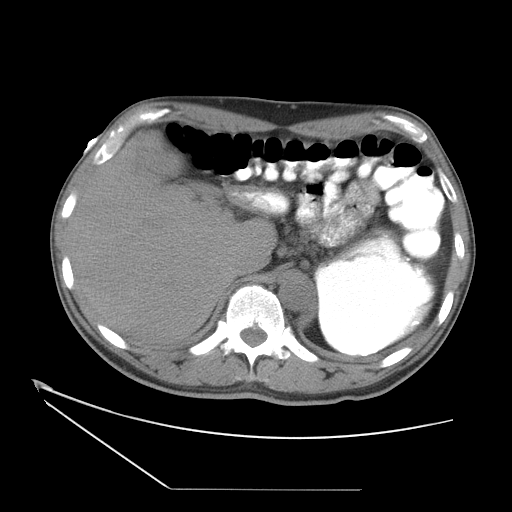
[im 81/93  soft-tissue]
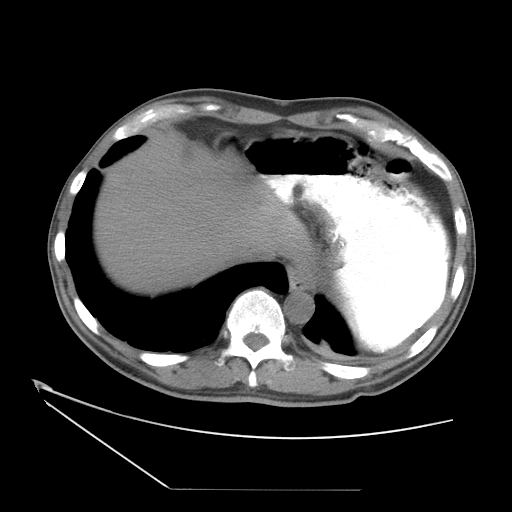
[im 81/93  lung]
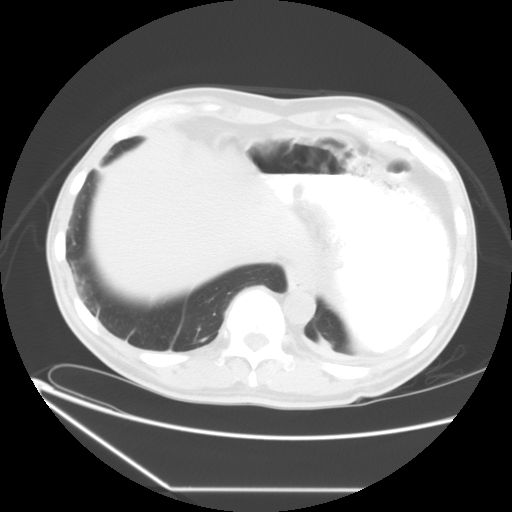
[im 84/93  lung]
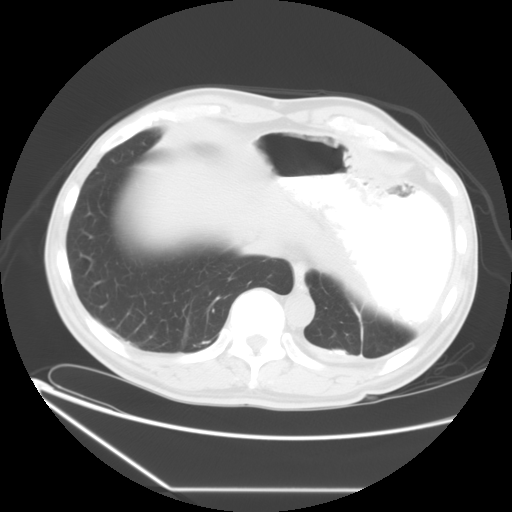
[im 87/93  soft-tissue]
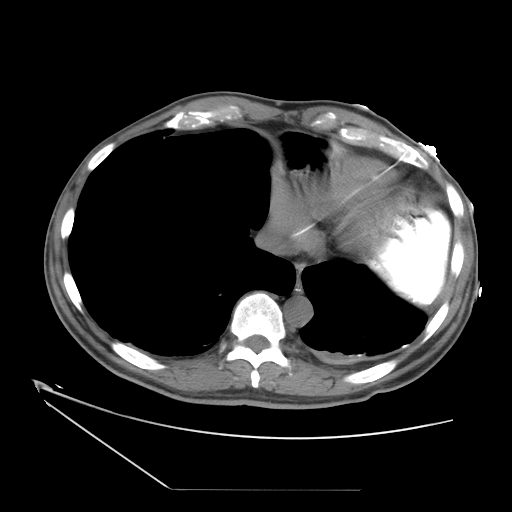
[im 87/93  lung]
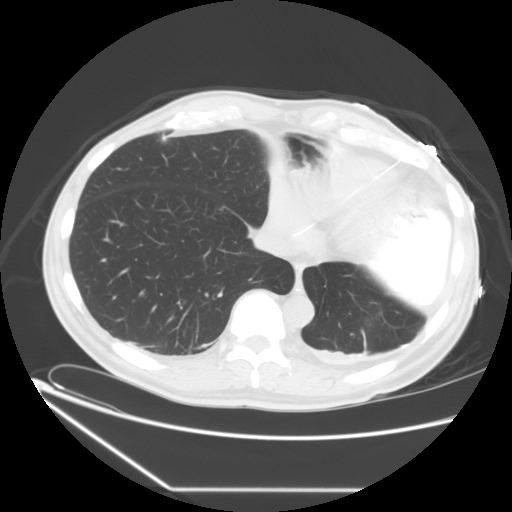
[im 90/93  lung]
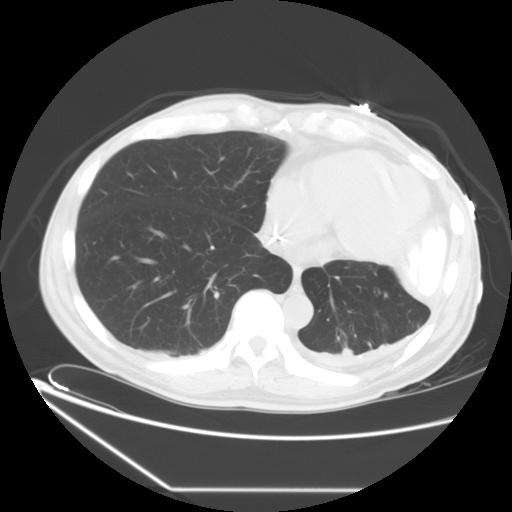

[15 of 32 positions shown; findings below may reference images not displayed]

## 2004-11-15 IMAGING — CT CT CHEST W/ CM
1 of 2 series · 14 of 30 positions shown, 18 images · IV contrast (100 ML OMNI)
Comparison: none

CLINICAL DATA: Weight loss.  Smoking history.  Decreased energy.  Chest pain.  Short of breath. 
 CT SCAN OF THE CHEST WITH CONTRAST 
 Spiral scanning was performed during intravenous administration of 100 cc Omnipaque 300.
 Comparison is made to previous studies of 01/22/01 and 08/20/00.
 On the right, there is mild pleural plaquing with some small calcifications.  There is mild pulmonary scarring at the right base.  These findings have not changed.  
 On the left, there is more extensive pleural disease with more widespread pleural plaques with areas of calcifications.  Scarring in the left upper lobe appears the same, as was seen previously.  There is some scarring in the left midlung and at the left base.  Some of this may be rounded atelectasis.  It does not appear changed in any significant fashion.  There is no sign of a developing mass.  There is no mediastinal or hilar adenopathy.  The axillary areas appear negative.  Scans in the upper abdomen show no worrisome lesion.  It is difficult to evaluate the liver because of the early phase of contrast enhancement.  I don?t see any evidence of pulmonary emboli, although this was not a pulmonary emboli protocol study.

[Series 2: routine chest · axial · 0.70mm/px · z∈[-316,-16]mm · 14 of 71 slices shown, 18 images]
[im 6/71  mediastinal]
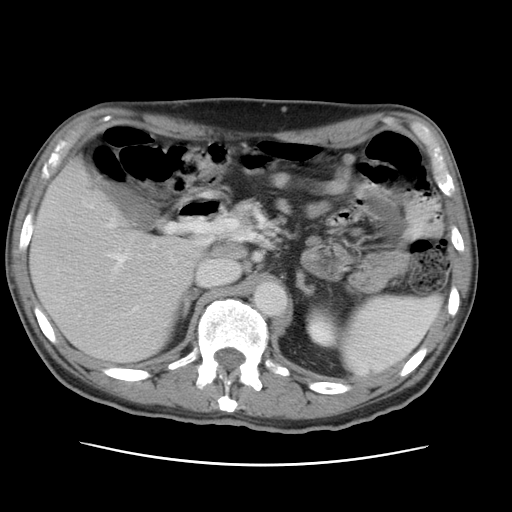
[im 6/71  lung]
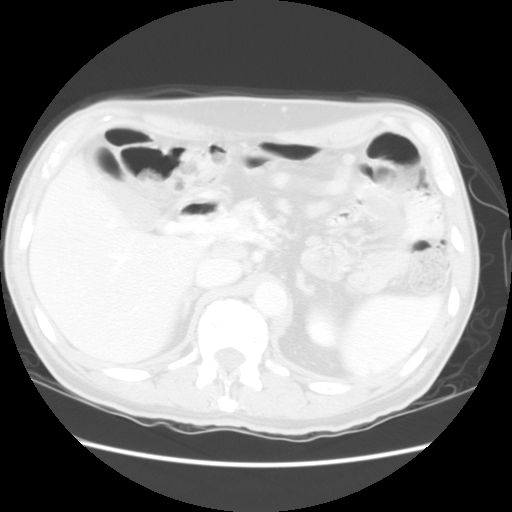
[im 11/71  lung]
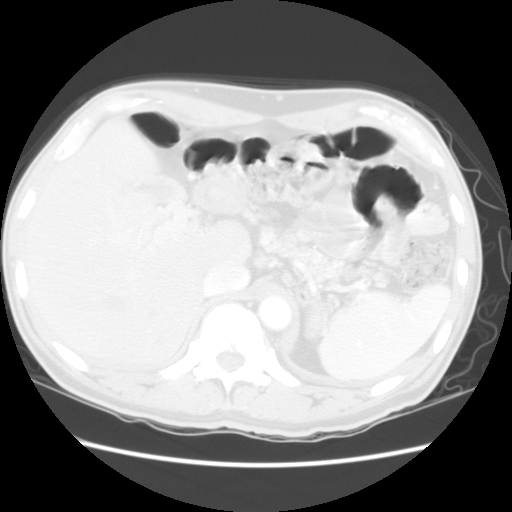
[im 16/71  lung]
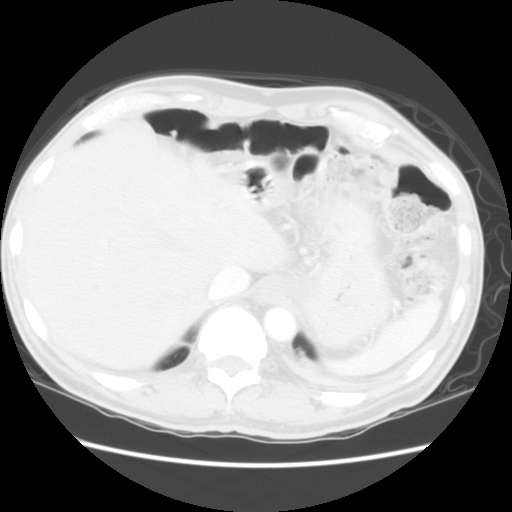
[im 21/71  lung]
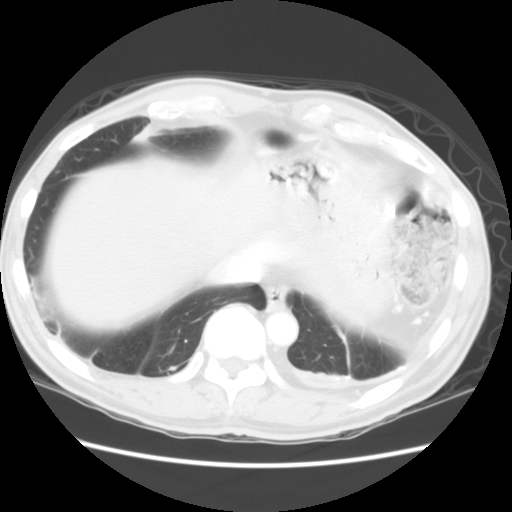
[im 26/71  mediastinal]
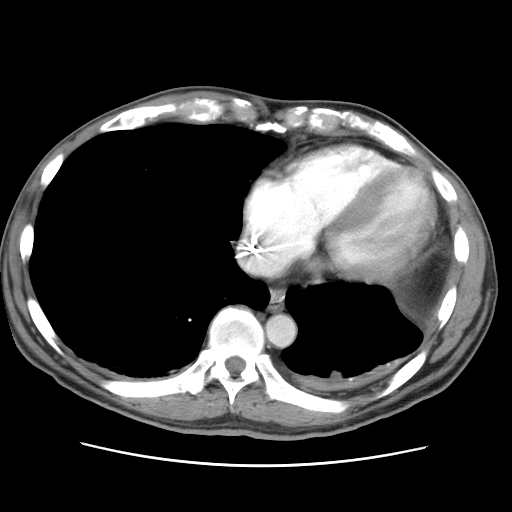
[im 26/71  lung]
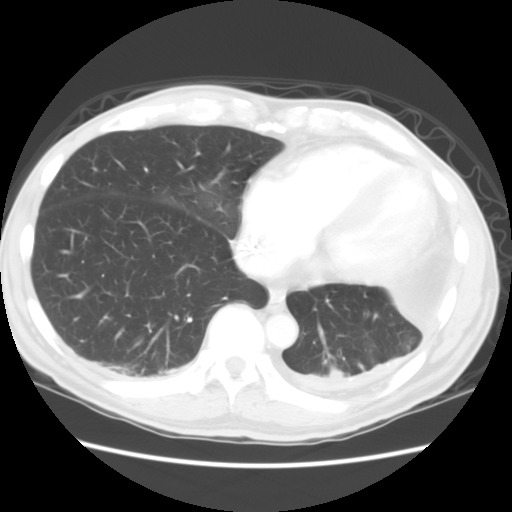
[im 31/71  lung]
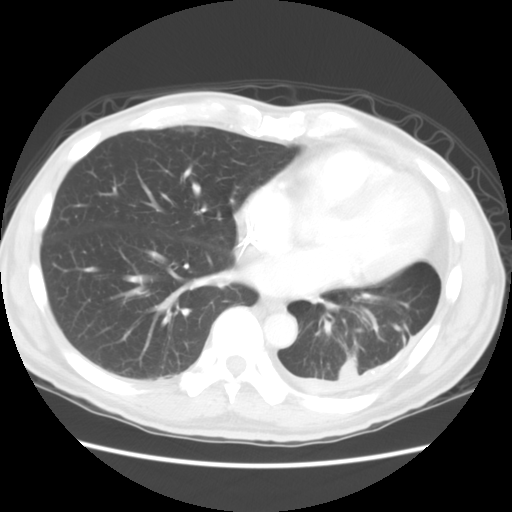
[im 35/71  lung]
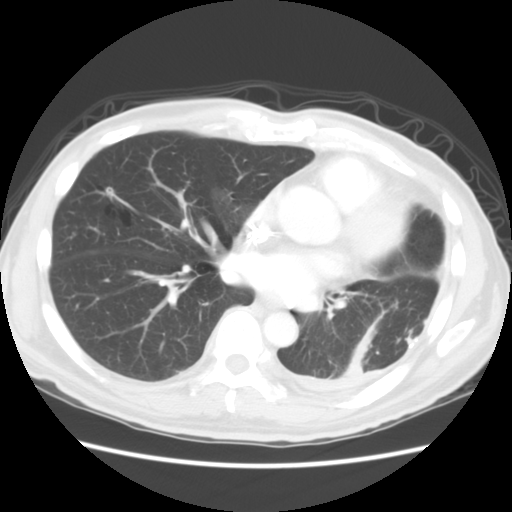
[im 36/71  lung]
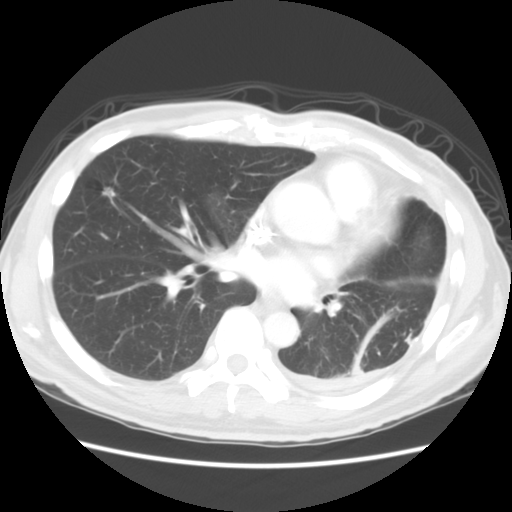
[im 41/71  mediastinal]
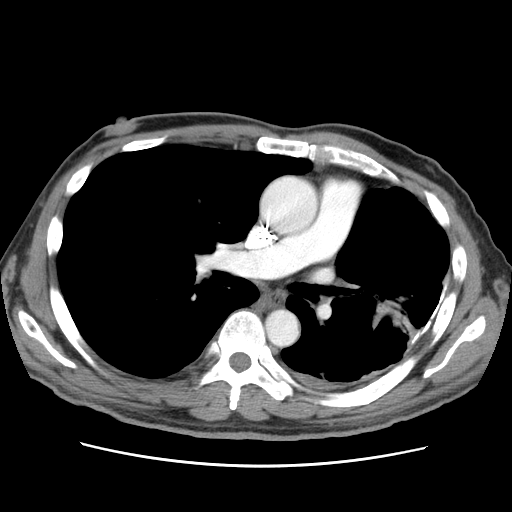
[im 41/71  lung]
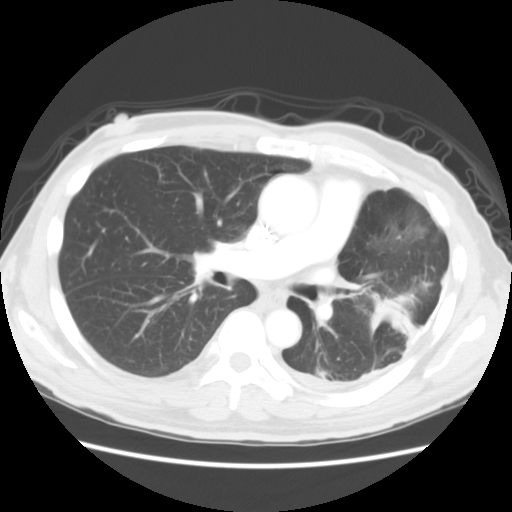
[im 46/71  lung]
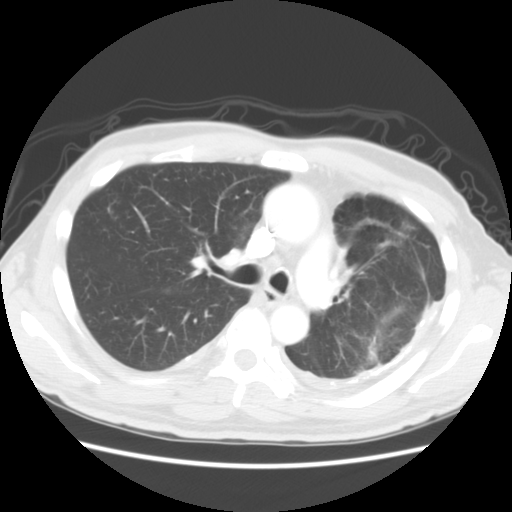
[im 51/71  lung]
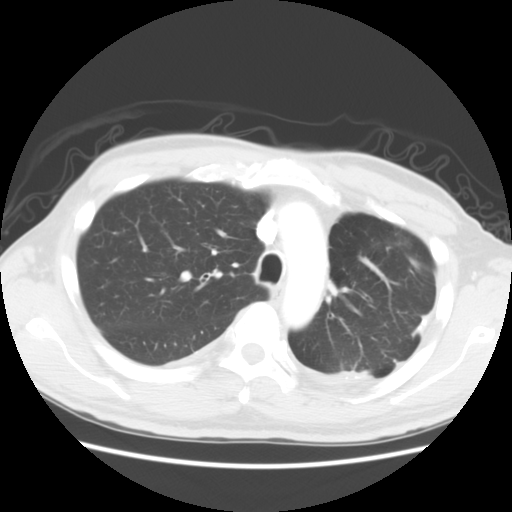
[im 56/71  lung]
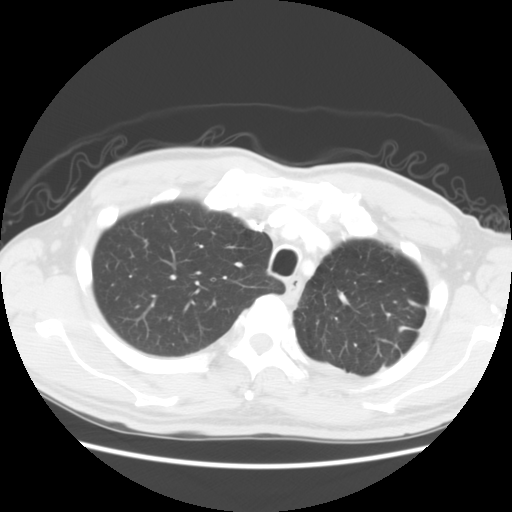
[im 61/71  mediastinal]
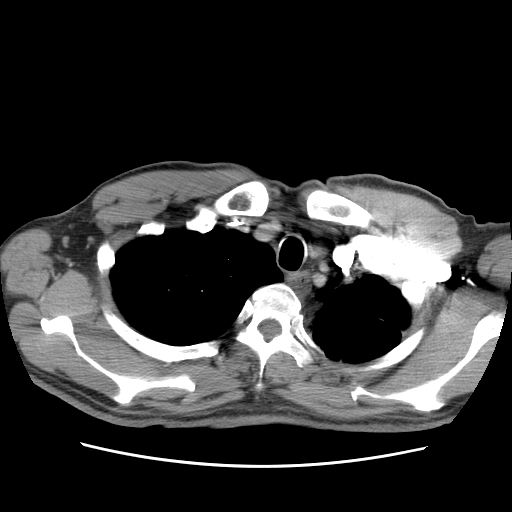
[im 61/71  lung]
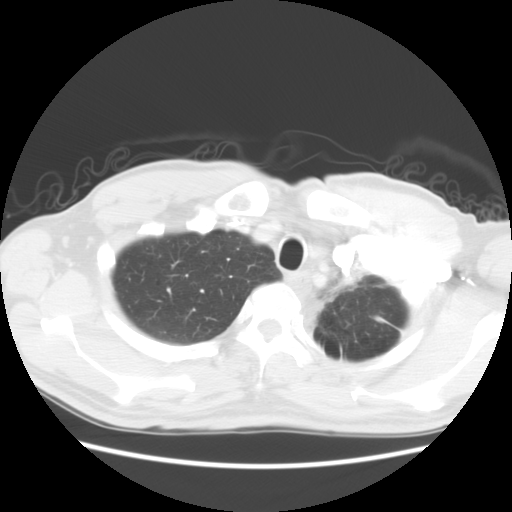
[im 66/71  lung]
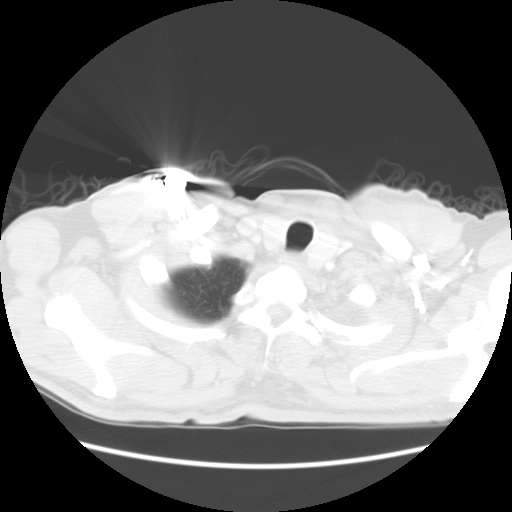

[14 of 30 positions shown; findings below may reference images not displayed]

IMPRESSION: Bilateral pleural plaques with calcification, much more extensive in the left hemithorax than the right.  Findings are likely to represent previous asbestos exposure.  The pattern of pleural and parenchymal scarring has not changed since 4884.  There is no evidence of any neoplastic disease or any finding that would lead me to believe that the patient is actively infected.  See above for a full discussion.

## 2004-12-19 ENCOUNTER — Ambulatory Visit: Payer: Self-pay | Admitting: Internal Medicine

## 2005-01-01 IMAGING — CT CT HEAD W/O CM
1 of 2 series · 13 of 30 positions shown, 17 images · non-contrast
Comparison: none

CLINICAL DATA: Right sided headache.  Weakness.  
 CT OF THE HEAD WITHOUT IV CONTRAST
 There are no midline shifts or mass effects and the ventricles are normal in size and contour.  There is no CT scan evidence for hemorrhage and there are no extraaxial fluid collections.  The bone window settings are normal.  
 IMPRESSION
 Normal study.

[Series 2: brain · axial · 0.47mm/px · z∈[+118,+238]mm · 13 of 28 slices shown, 17 images]
[im 2/28  brain]
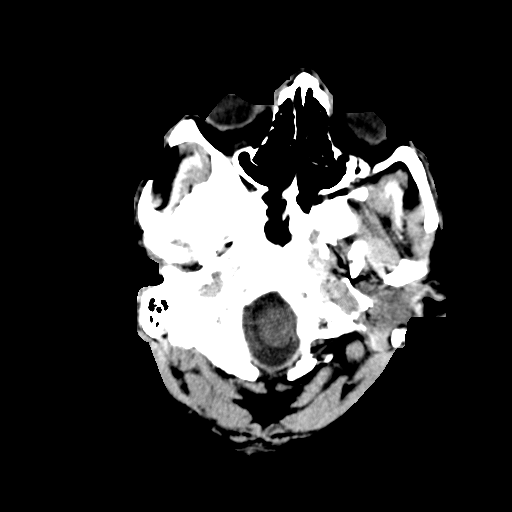
[im 2/28  bone]
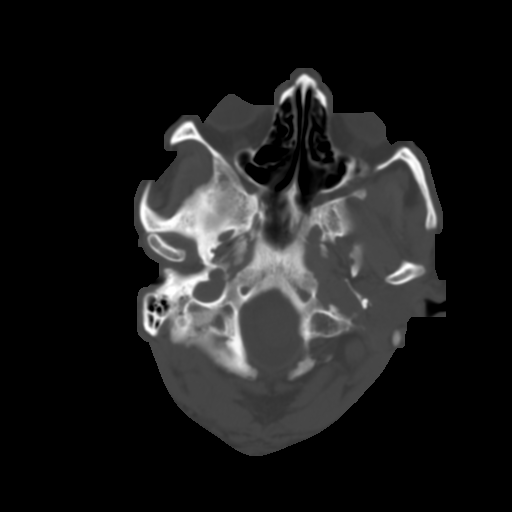
[im 4/28  brain]
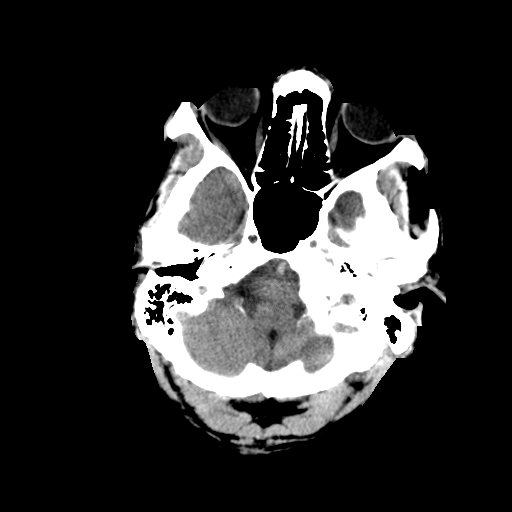
[im 6/28  brain]
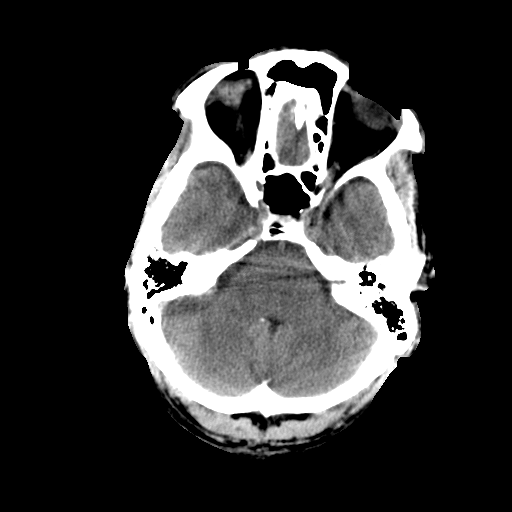
[im 8/28  brain]
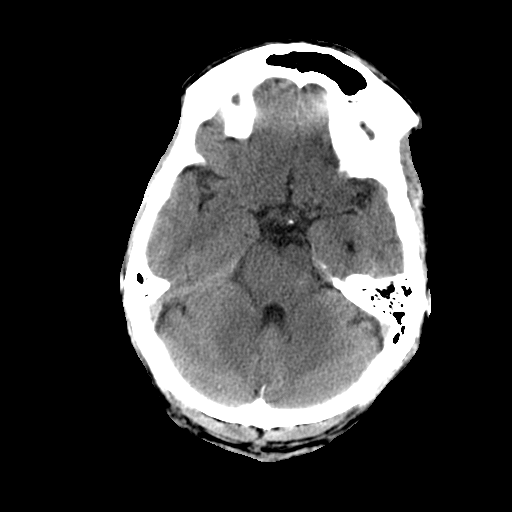
[im 10/28  brain]
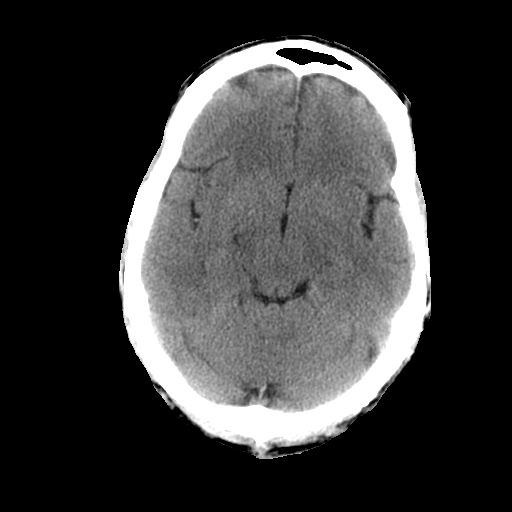
[im 10/28  bone]
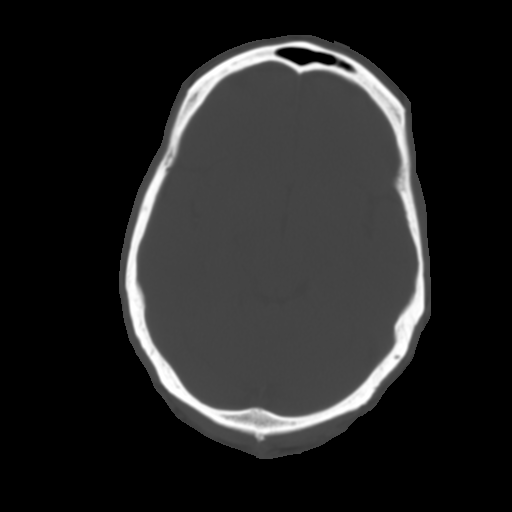
[im 12/28  brain]
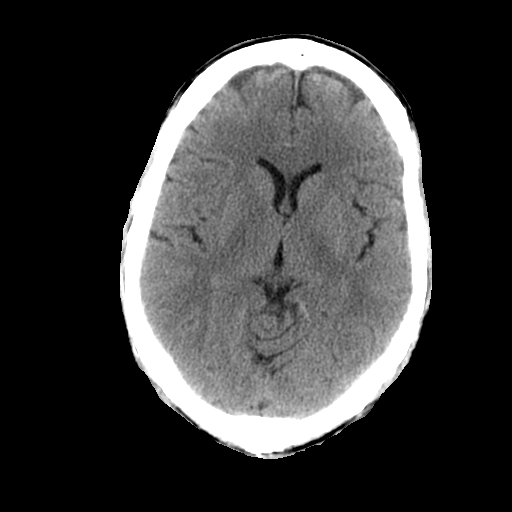
[im 14/28  brain]
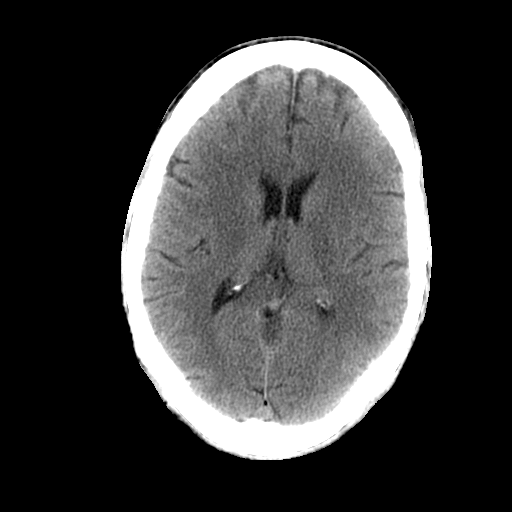
[im 16/28  brain]
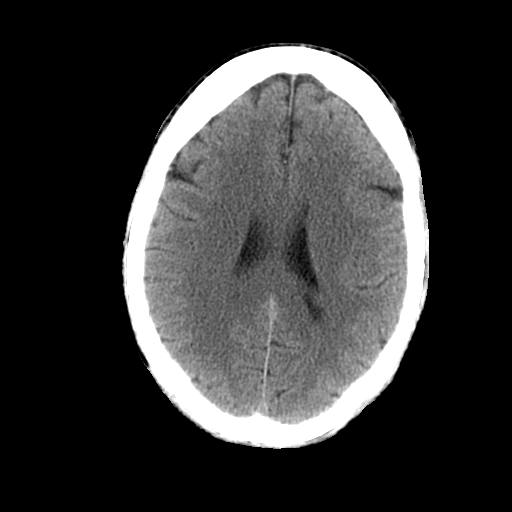
[im 18/28  brain]
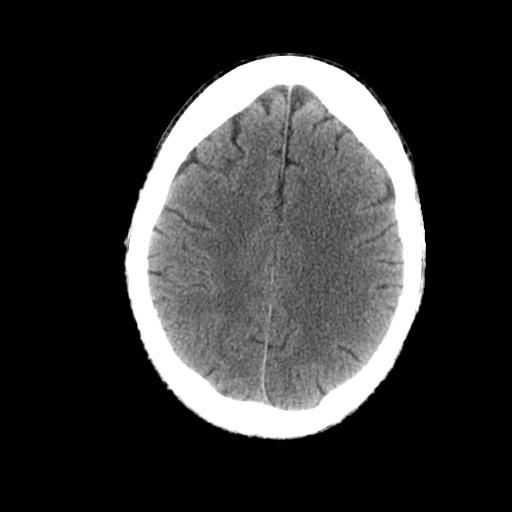
[im 18/28  bone]
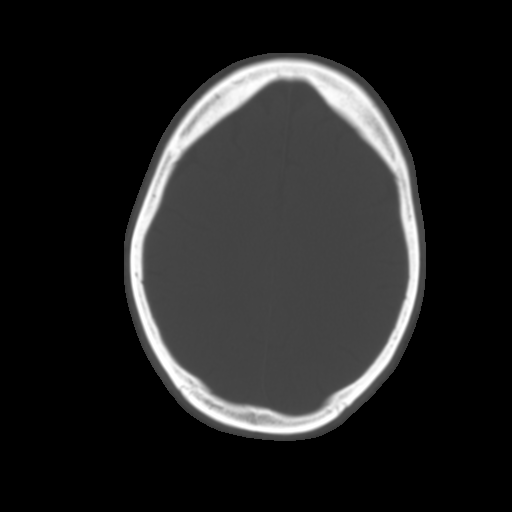
[im 20/28  brain]
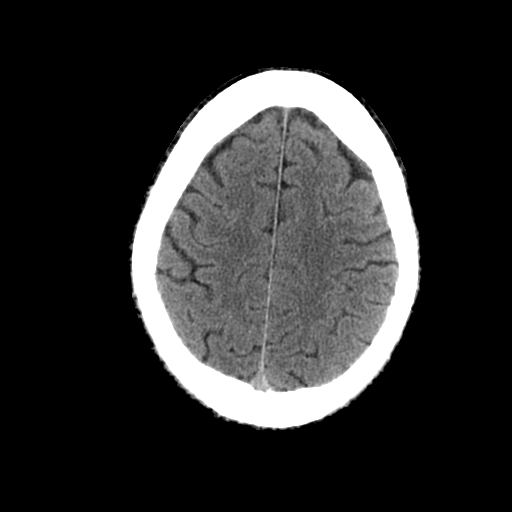
[im 22/28  brain]
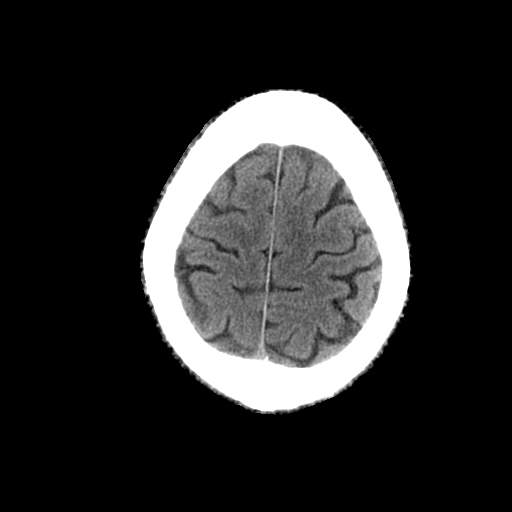
[im 24/28  brain]
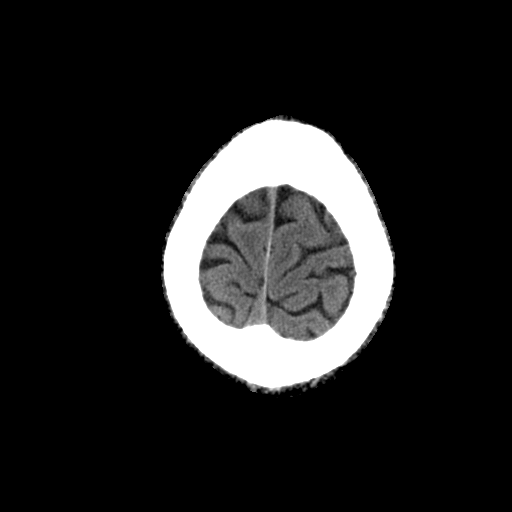
[im 26/28  brain]
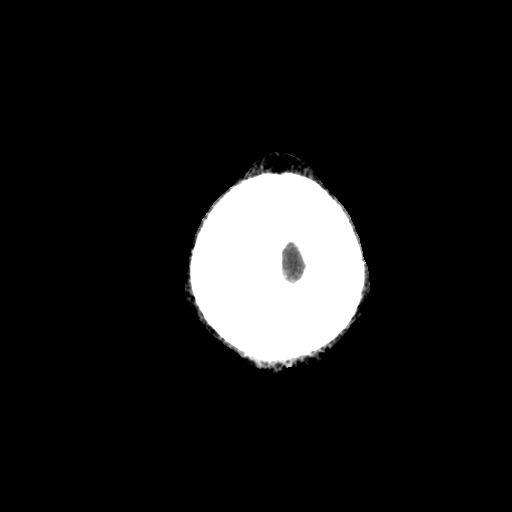
[im 26/28  bone]
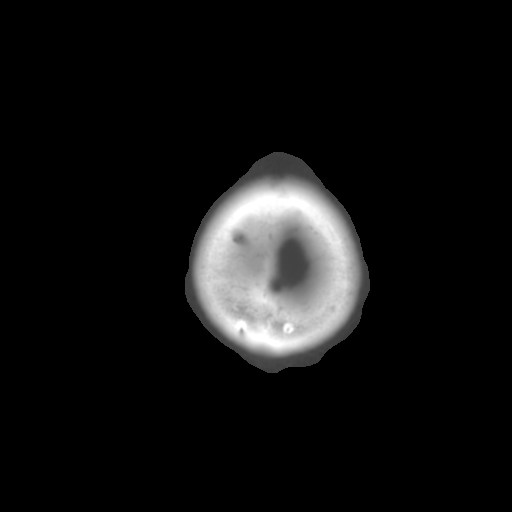

[13 of 30 positions shown; findings below may reference images not displayed]

## 2005-01-02 IMAGING — CT CT HEAD W/ CM
1 series · 16 of 26 positions shown, 20 images · IV contrast (omnipaque)
Comparison: none

CLINICAL DATA: persistent right-sided headache and left upper extremity weakness; followup to a scan of 10/10/03 at 9717 hours that was made without contrast.
 CT SCAN OF THE HEAD WITH CONTRAST
 After the intravenous injection of 100mL of Omnipaque 300 a series of scans of the entire head are made and show no evidence of intracranial mass, hemorrhage or midline shift.  No evidence of aneurysm or arterial venous malformation is seen.  
 Bone windows show the base of the skull, paranasal sinuses and the bony calvaria to be intact.
 IMPRESSION
 No acute disease.  No significant interval change.

[Series 2: brain · axial · 0.47mm/px · z∈[+166,+282]mm · 16 of 26 slices shown, 20 images]
[im 2/26  brain]
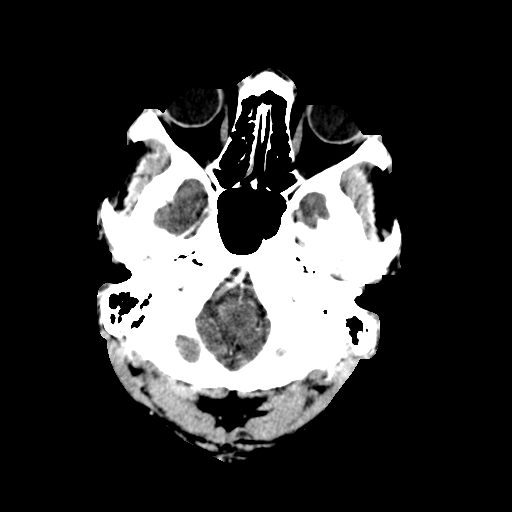
[im 2/26  bone]
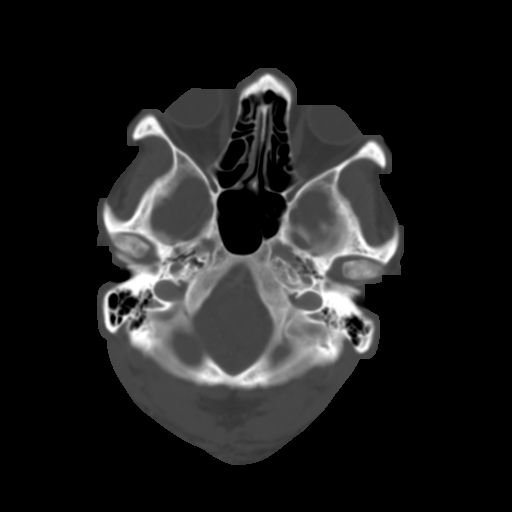
[im 4/26  brain]
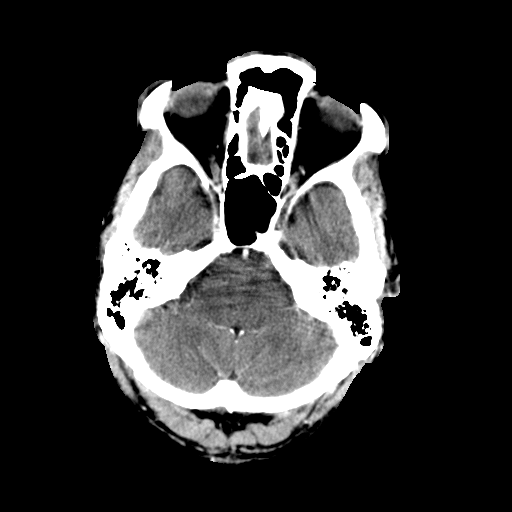
[im 5/26  brain]
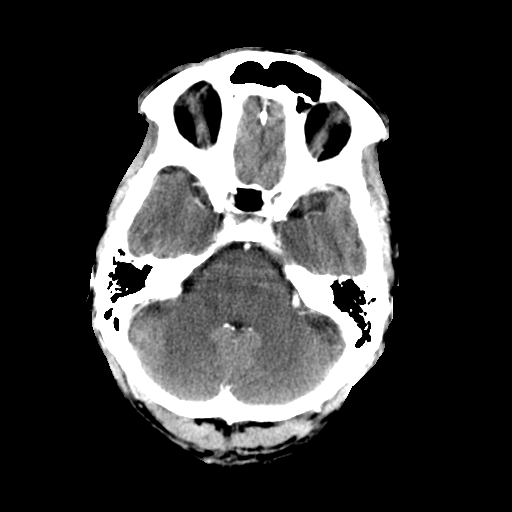
[im 7/26  brain]
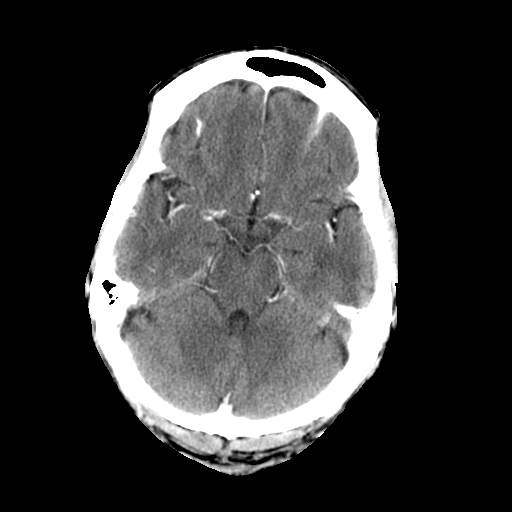
[im 8/26  brain]
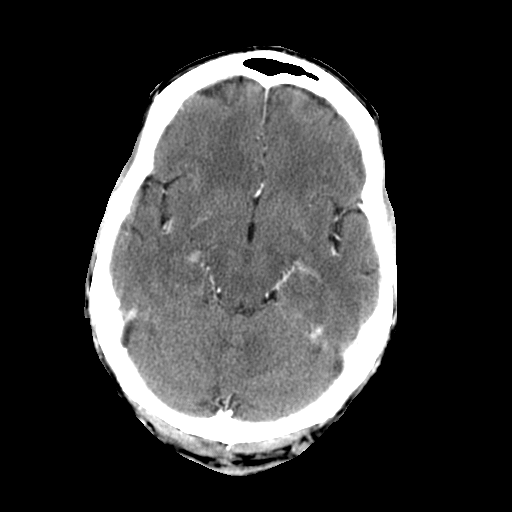
[im 8/26  bone]
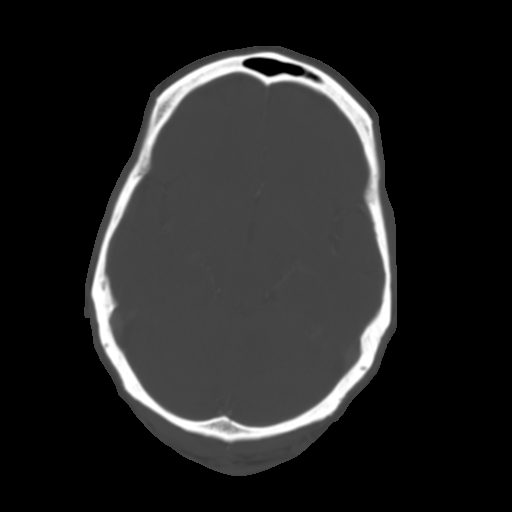
[im 10/26  brain]
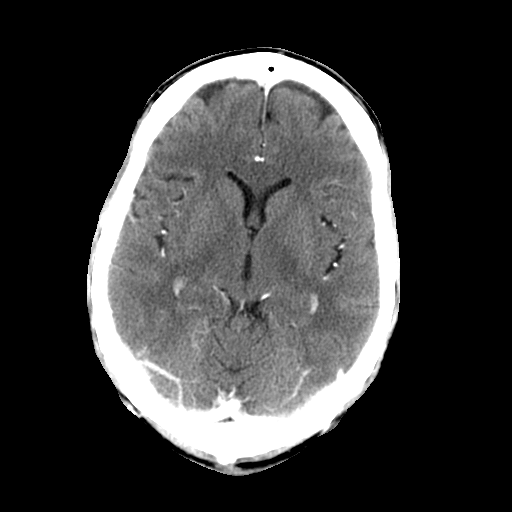
[im 11/26  brain]
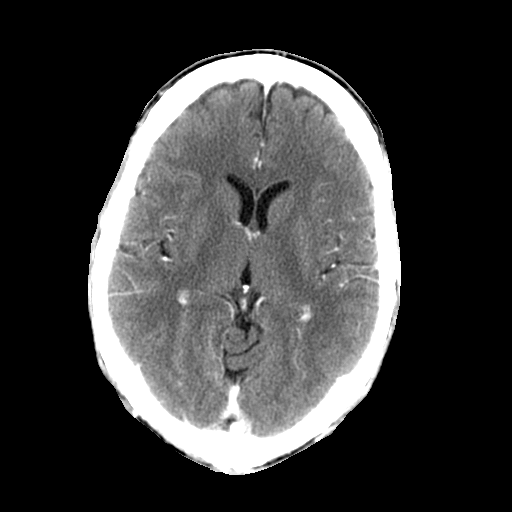
[im 13/26  brain]
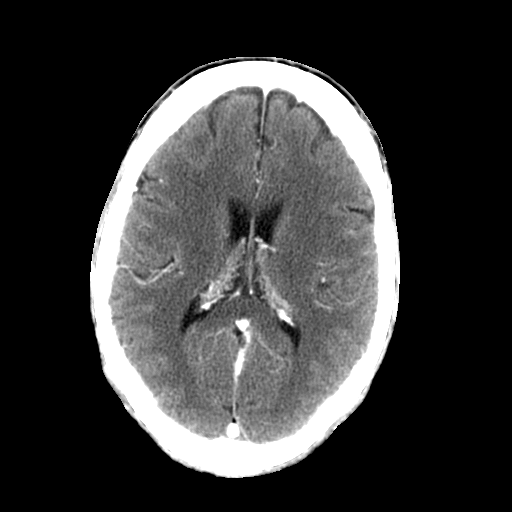
[im 14/26  brain]
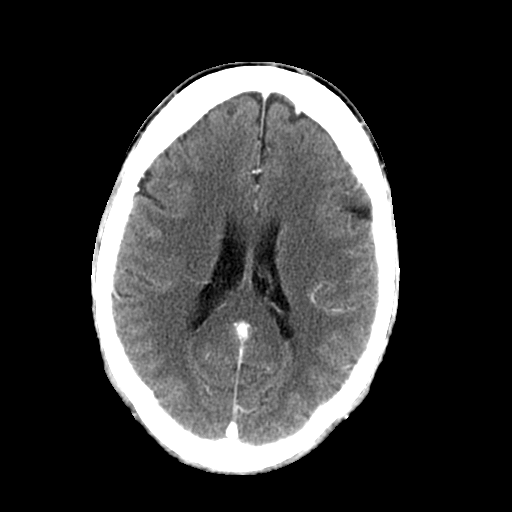
[im 14/26  bone]
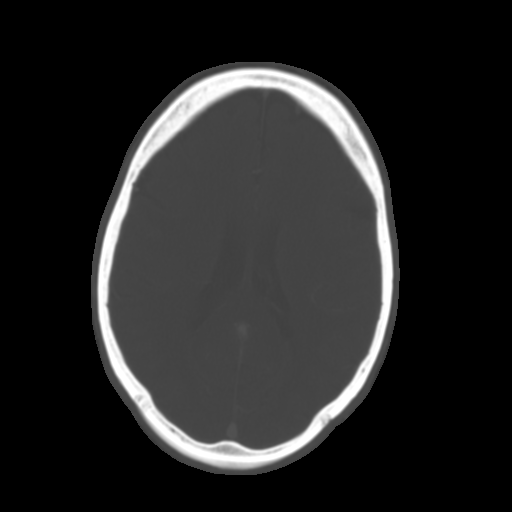
[im 16/26  brain]
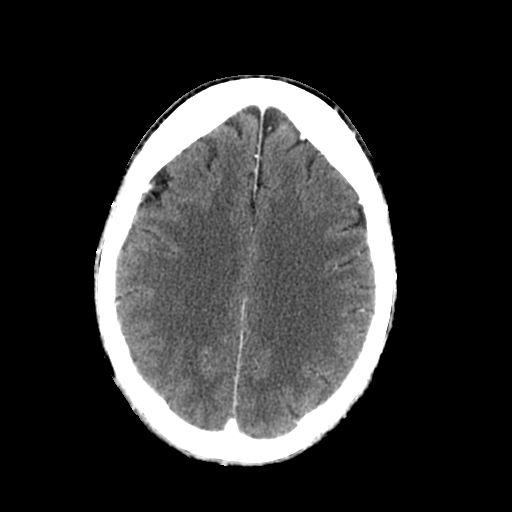
[im 17/26  brain]
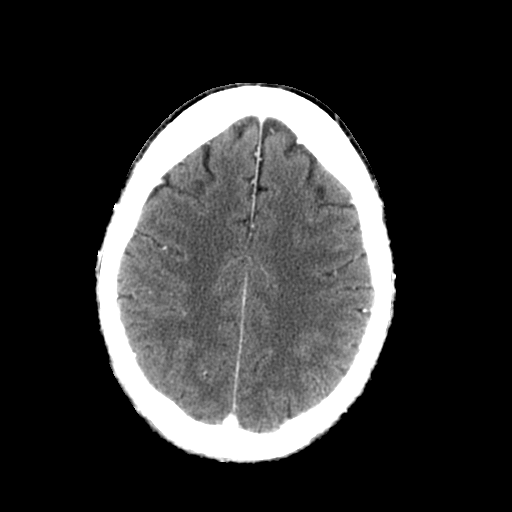
[im 19/26  brain]
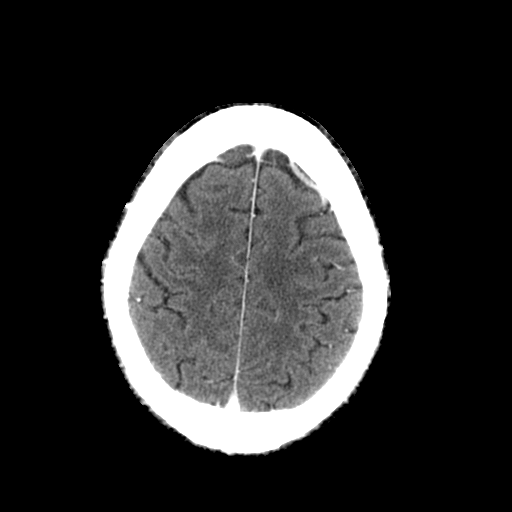
[im 20/26  brain]
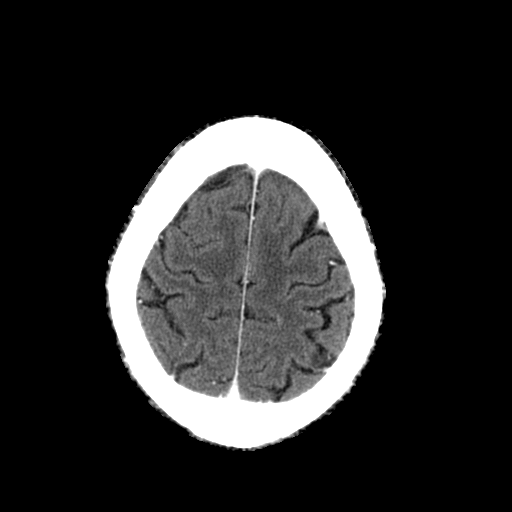
[im 20/26  bone]
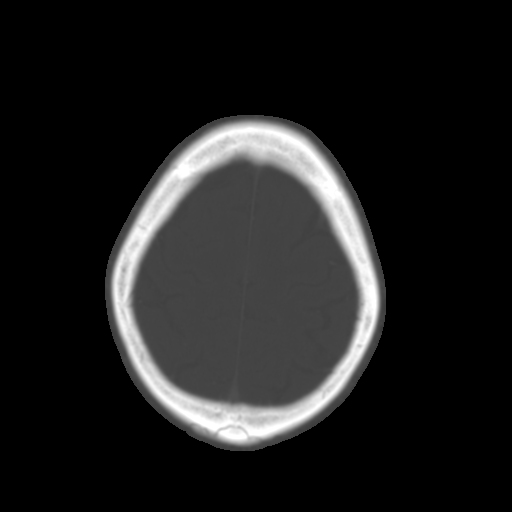
[im 22/26  brain]
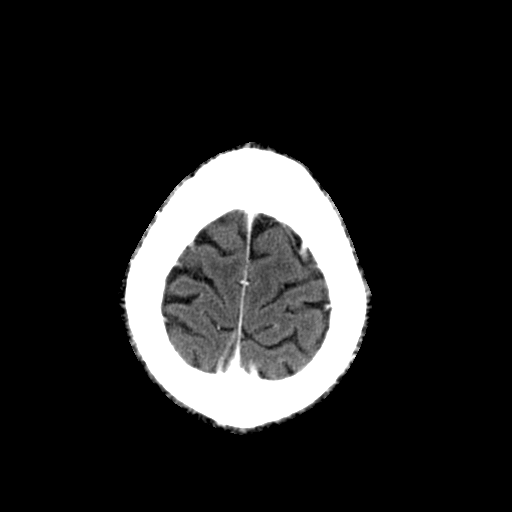
[im 23/26  brain]
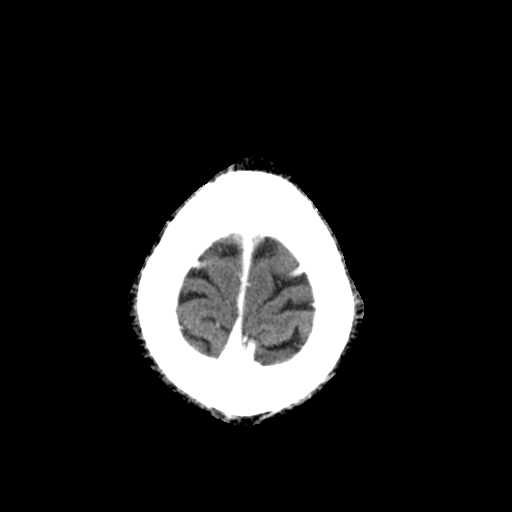
[im 25/26  brain]
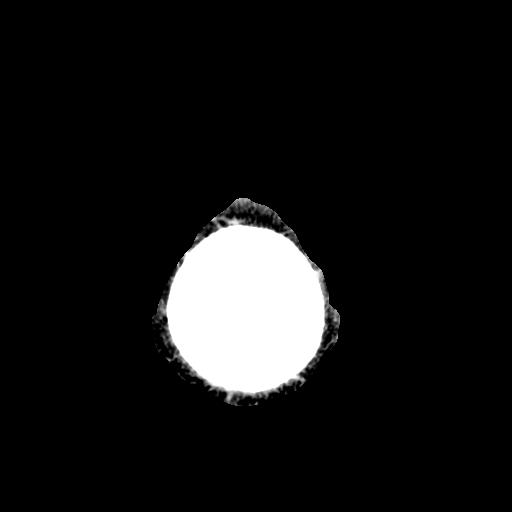

[16 of 26 positions shown; findings below may reference images not displayed]

## 2005-01-02 IMAGING — CR DG CHEST 2V
2 series · 2 of 2 positions shown · non-contrast
Comparison: 02/26/03.

CLINICAL DATA: Severe weakness/headaches.
 TWO VIEW CHEST

[view not recorded (1 of 2)]
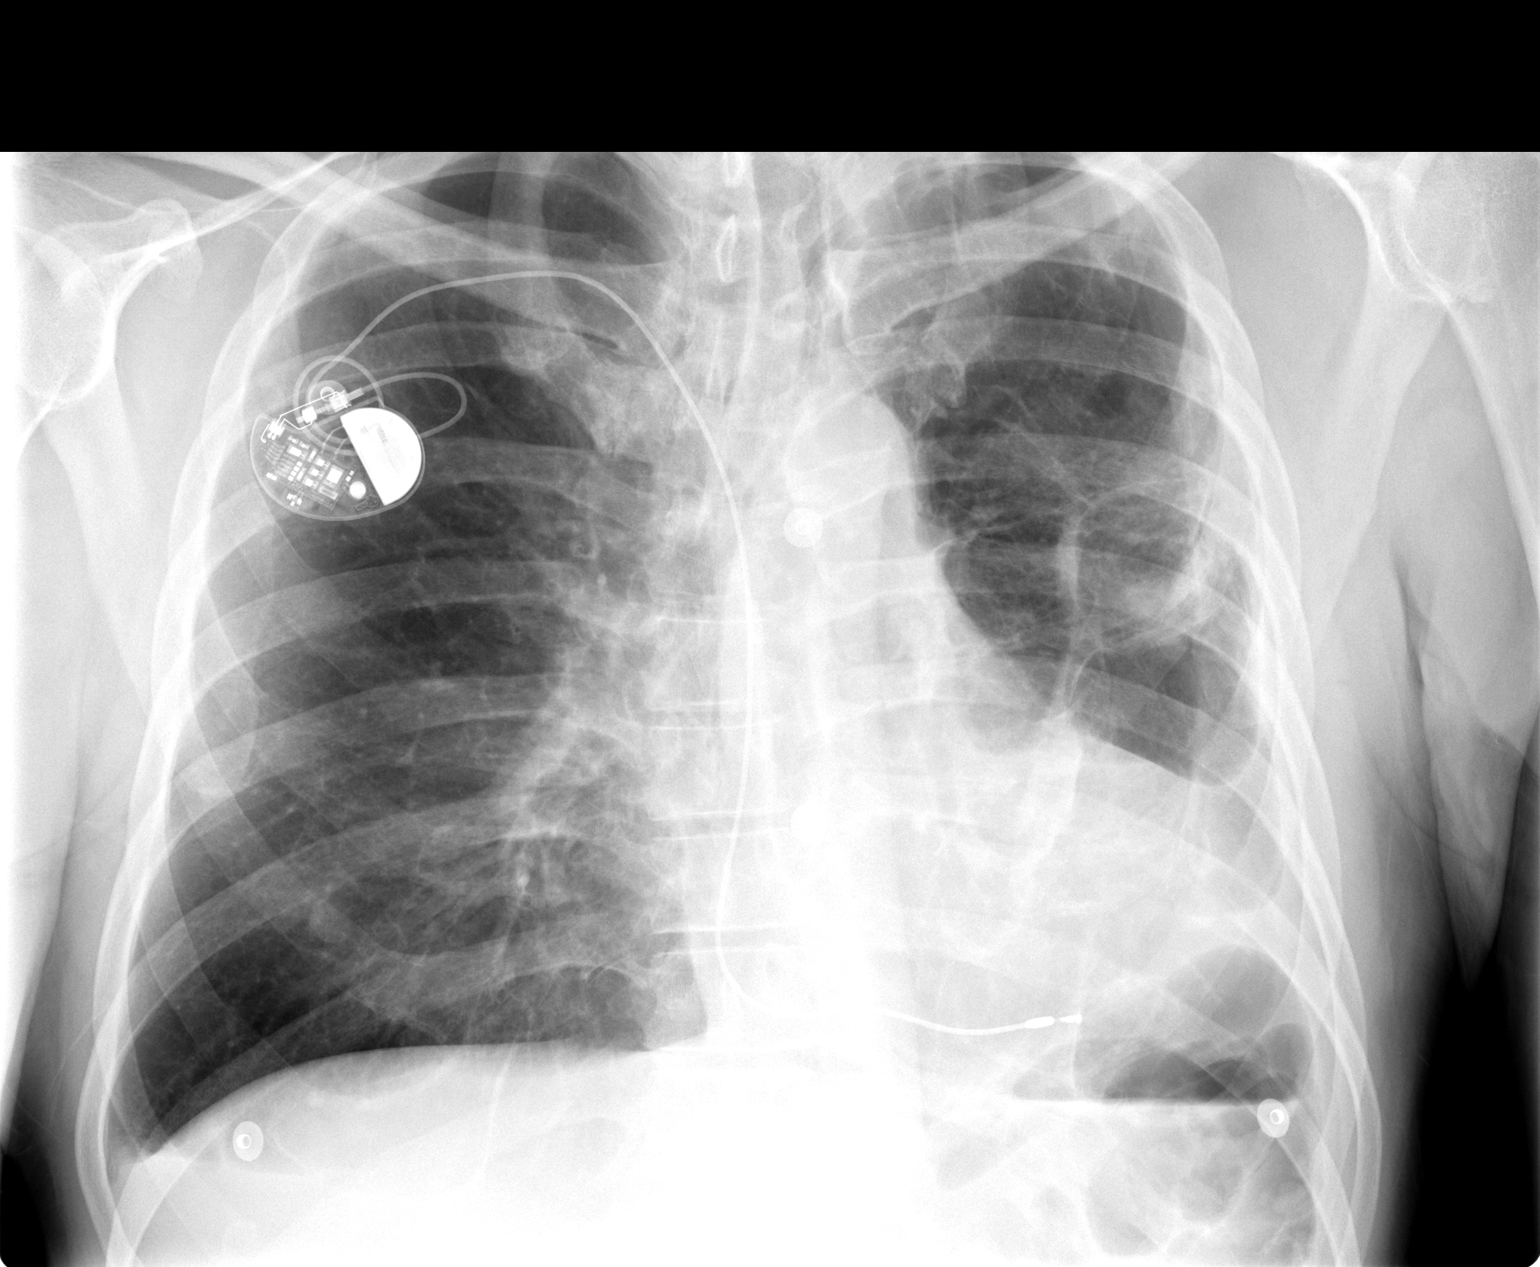

[view not recorded (2 of 2)]
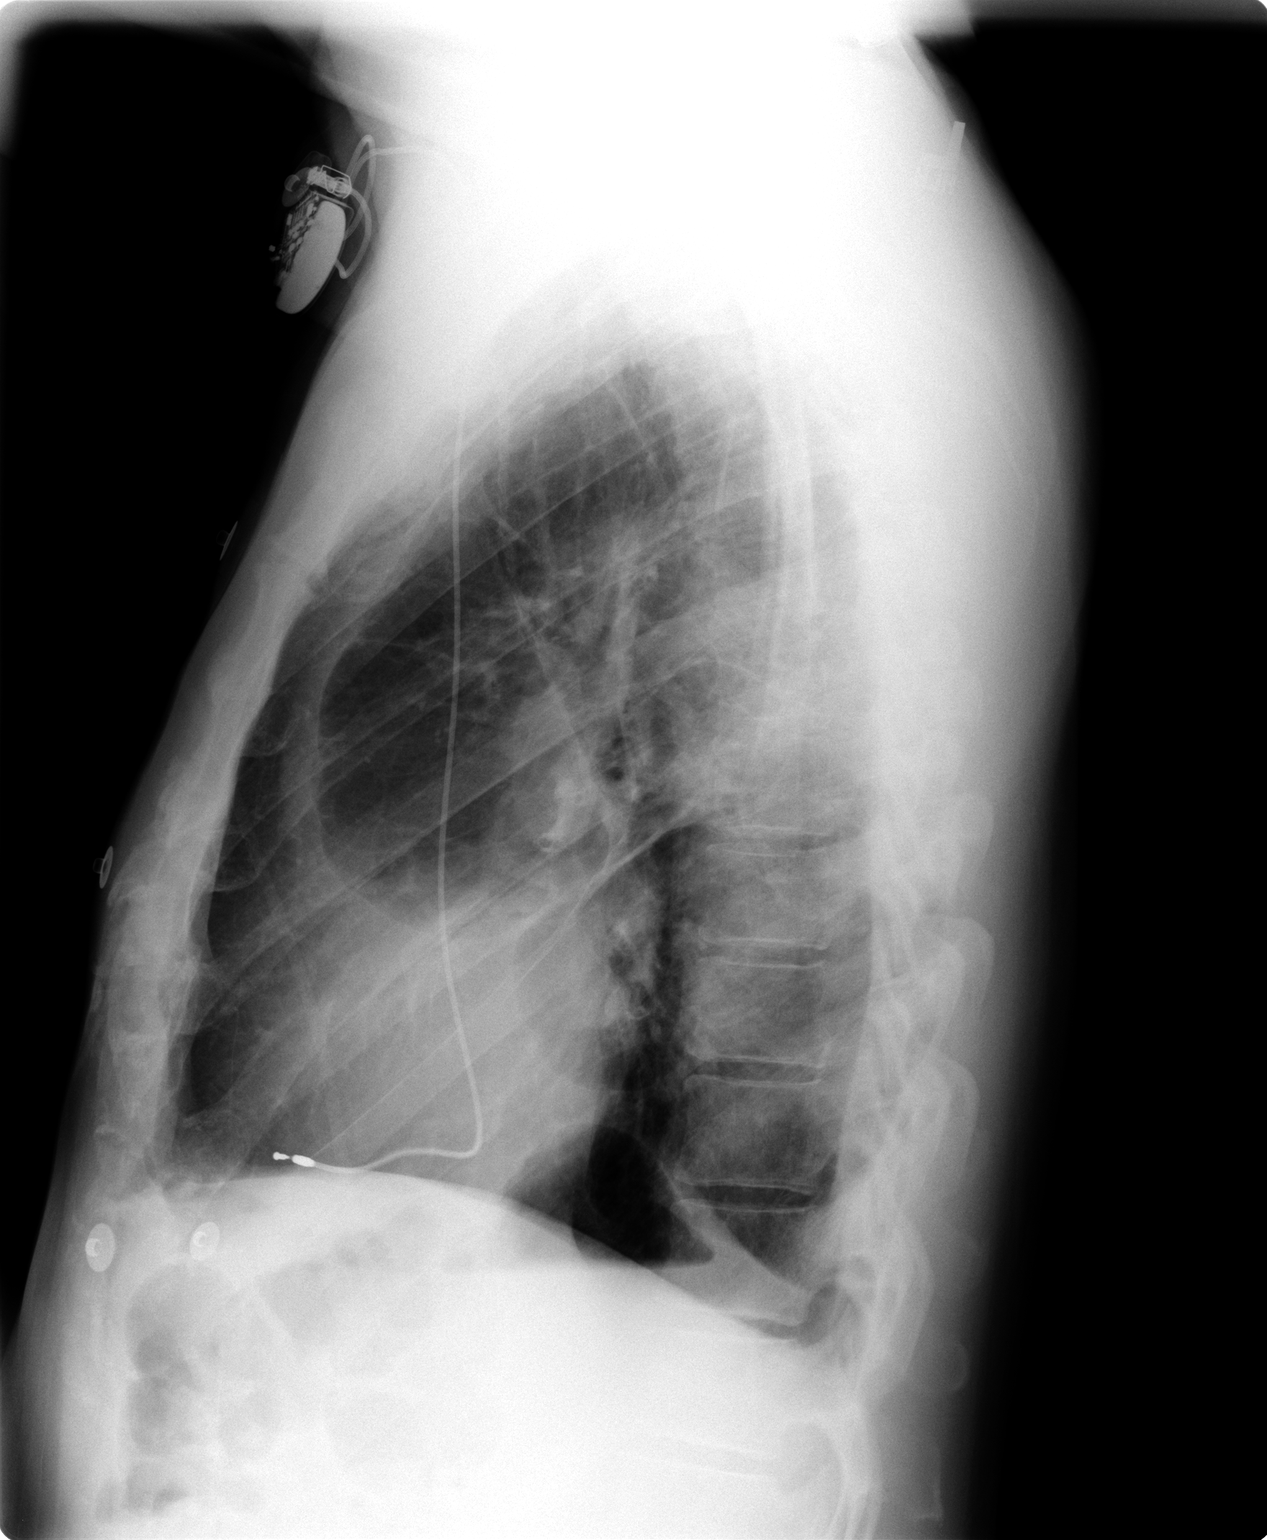

[2 of 2 positions shown; findings below may reference images not displayed]

Extensive left pleural/parenchymal scar-like changes which have not progressed.  Right lung clear although the nipple shadow simulates a nodule.  Single-lead RV pacer, as before.
 IMPRESSION 
 Extensive chronic left pleural and parenchymal scarring-no change.
 Right nipple shadow simulates nodule. 
 No active disease.

## 2005-01-03 IMAGING — CT CT ANGIO NECK
1 of 11 series · 2 of 14 positions shown · IV contrast ([ID] OMNI 350)
Comparison: none

CLINICAL DATA: Severe weakness.  Right-sided headache.  Evaluate for possible aneurysm.
TECHNIQUE: 0.625 mm thick slices were obtained through the neck and head during the bolus infusion of 140 cc Omnipaque 350.  
CT ANGIOGRAPHY OF THE NECK
Initial noncontrast images showed no significant abnormality.  Post infusion images viewed both in the axial plane as well as with coronal, sagittal and oblique reformats as well as 3 dimensional reformations.  There is no evidence for carotid stenosis.  The bifurcation is free of disease.  Cervical ICA shows no fibromuscular dysplasia.  No abnormal areas of enhancement are seen.  Both vertebrals are patent with the right being dominant.  Incidental note is made of mild cervical spondylosis.  There are no apparent critical areas of spinal stenosis however. 
IMPRESSION
No evidence for extracranial vascular disease involving the carotid or vertebral circulation.  
CT ANGIOGRAPHY OF THE HEAD
Initial noncontrast images were obtained and showed no acute intracranial abnormality.  Post infusion images were examined with sagittal, axial and coronal reformatted data along with 3 dimensional reconstruction.  No intracranial aneurysms are seen.  There is no evidence for vascular malformation.  No abnormal areas of enhancement are demonstrated.  No obvious cause is seen for the patient?s right-sided symptomatology. 
Negative CT angiography of the head.

[Series 104: circle of willis · axial · 0.43mm/px · z∈[+156,+269]mm · 2 of 1223 slices shown]
[im 408/1223  soft-tissue]
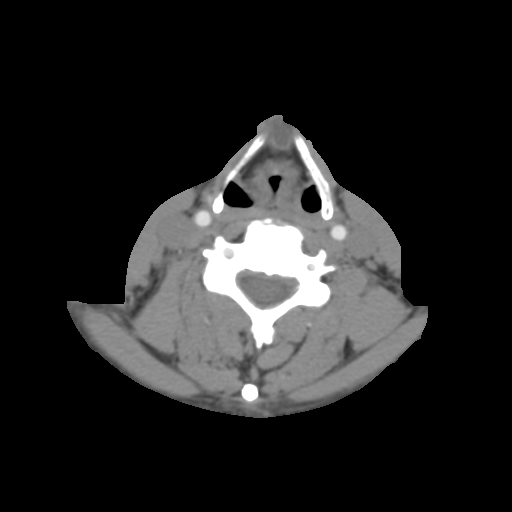
[im 815/1223  bone]
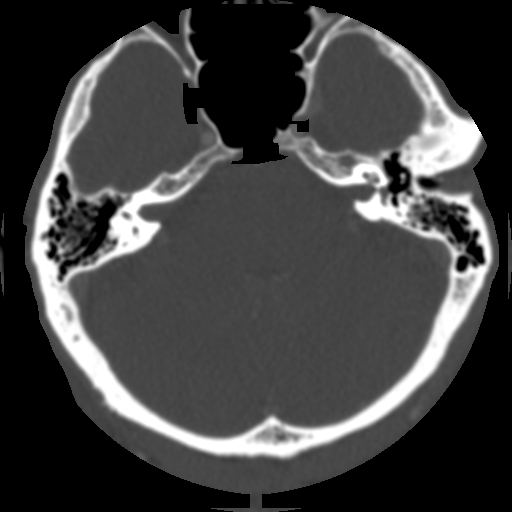

[2 of 14 positions shown; findings below may reference images not displayed]

## 2005-02-04 ENCOUNTER — Ambulatory Visit: Payer: Self-pay | Admitting: Internal Medicine

## 2005-02-20 ENCOUNTER — Ambulatory Visit: Payer: Self-pay | Admitting: Orthopedic Surgery

## 2005-03-21 ENCOUNTER — Ambulatory Visit: Payer: Self-pay | Admitting: Internal Medicine

## 2005-03-22 ENCOUNTER — Ambulatory Visit: Payer: Self-pay | Admitting: Internal Medicine

## 2005-04-29 ENCOUNTER — Ambulatory Visit (HOSPITAL_COMMUNITY): Admission: RE | Admit: 2005-04-29 | Discharge: 2005-04-29 | Payer: Self-pay | Admitting: Cardiovascular Disease

## 2005-05-23 ENCOUNTER — Ambulatory Visit: Payer: Self-pay | Admitting: Internal Medicine

## 2005-05-24 IMAGING — CR DG HIP COMPLETE 2+V*R*
3 series · 3 of 3 positions shown · non-contrast
Comparison: none

CLINICAL DATA: Right hip pain.  
 RIGHT HIP COMPLETE 03/01/04
 There are mild to moderate degenerative arthritic changes seen associated with the right hip.  There is no evidence for an occult fracture and there are no destructive changes.  There is a left total hip prosthesis present. 
 IMPRESSION
 Mild to moderate degenerative arthritic changes associated with the right hip.

[view not recorded (1 of 3)]
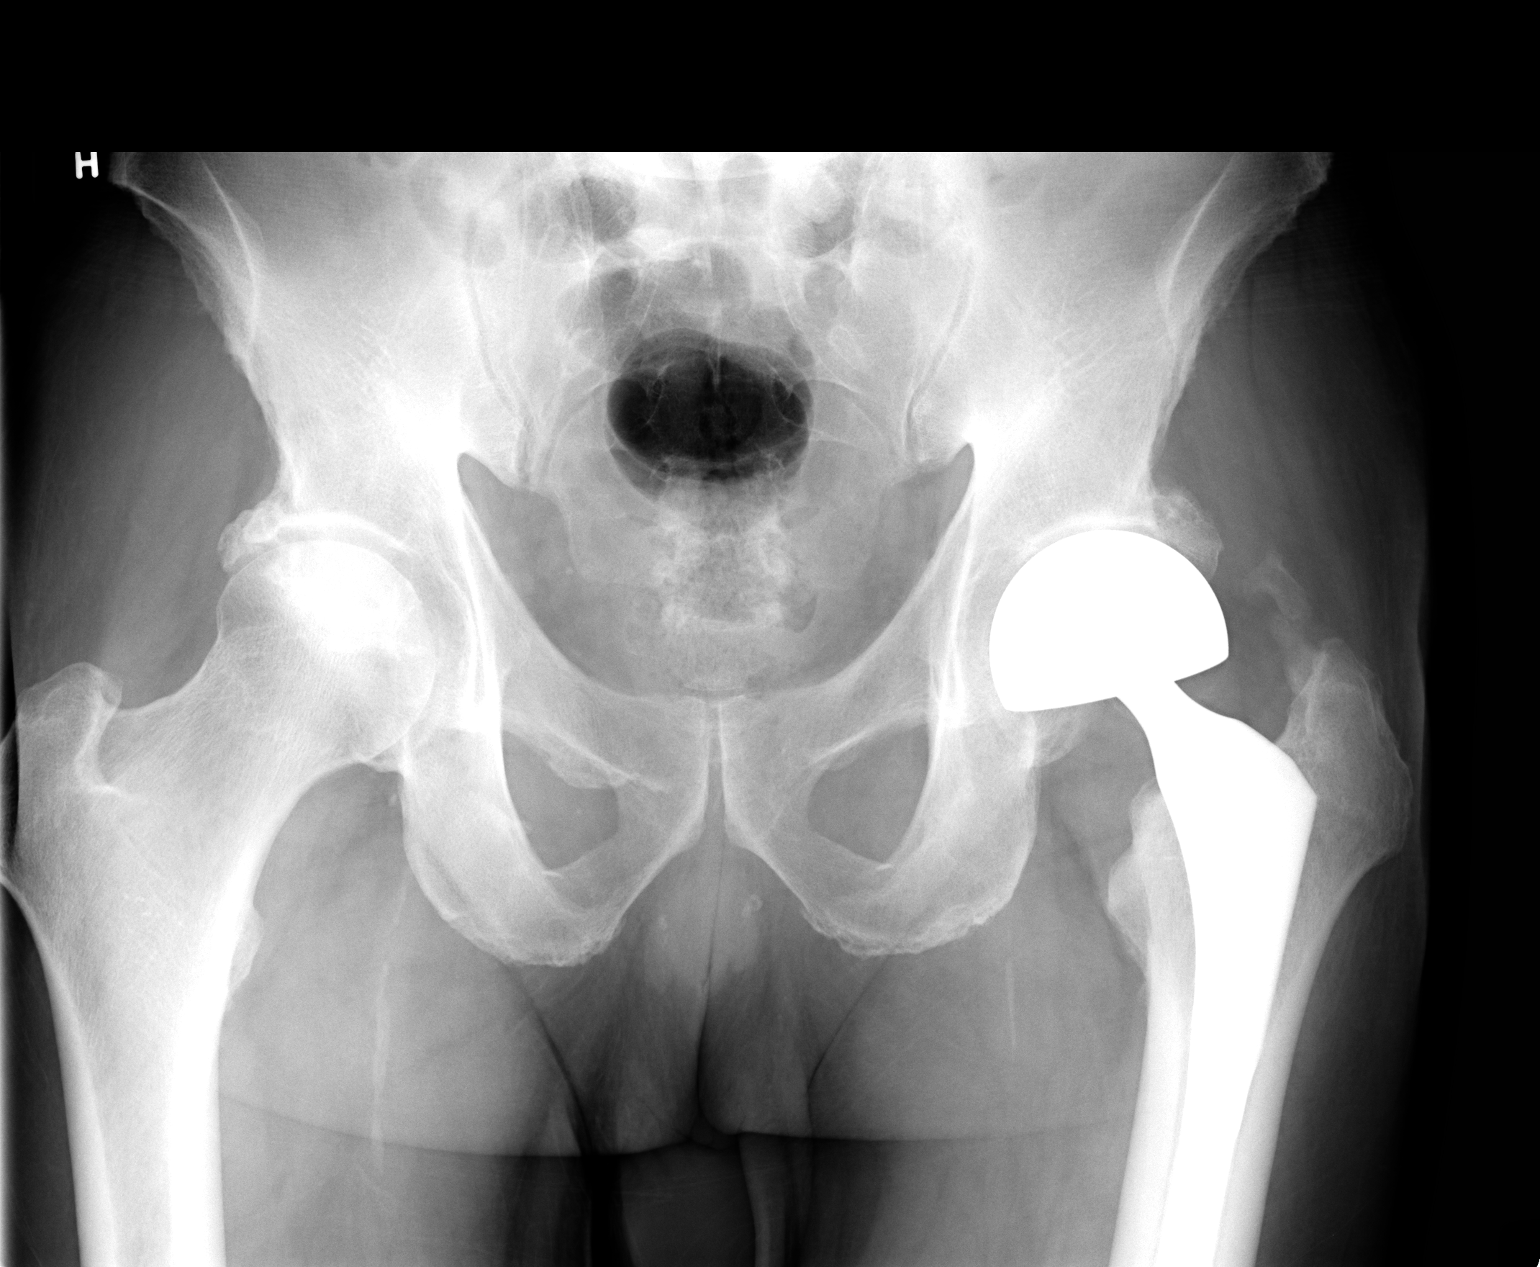

[view not recorded (2 of 3)]
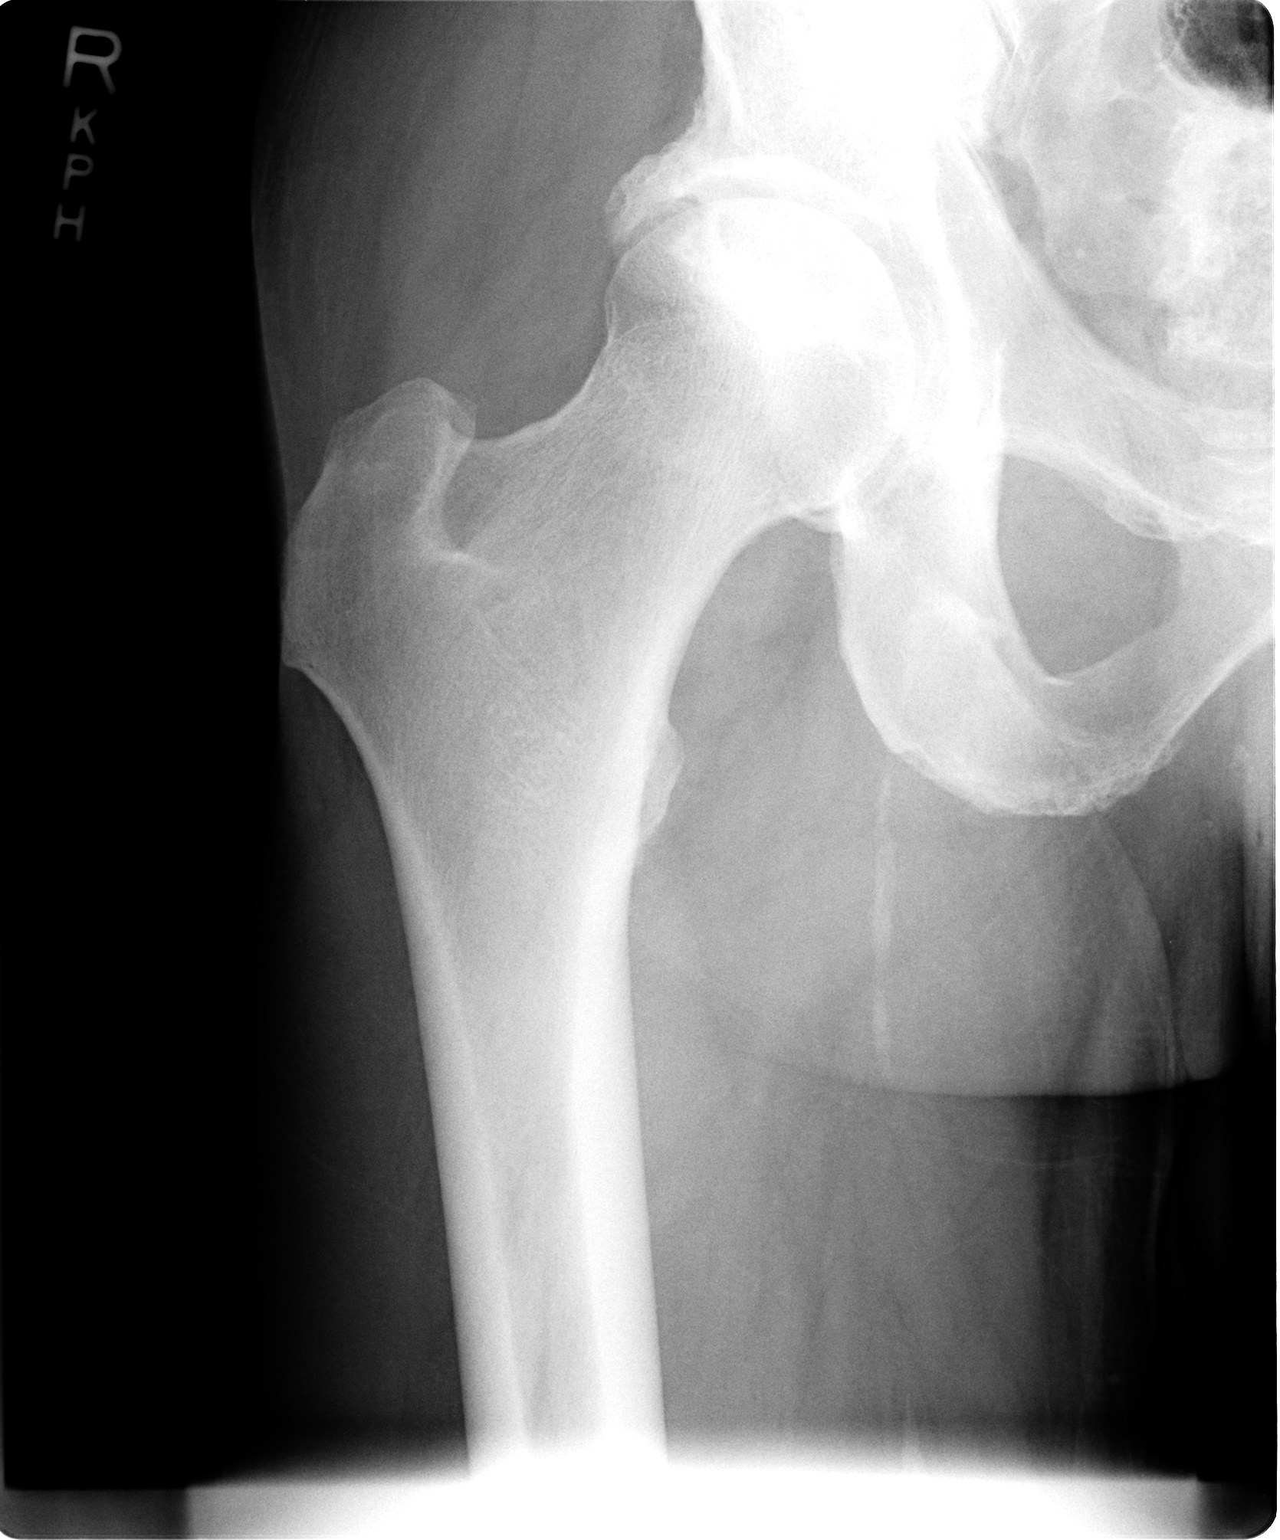

[view not recorded (3 of 3)]
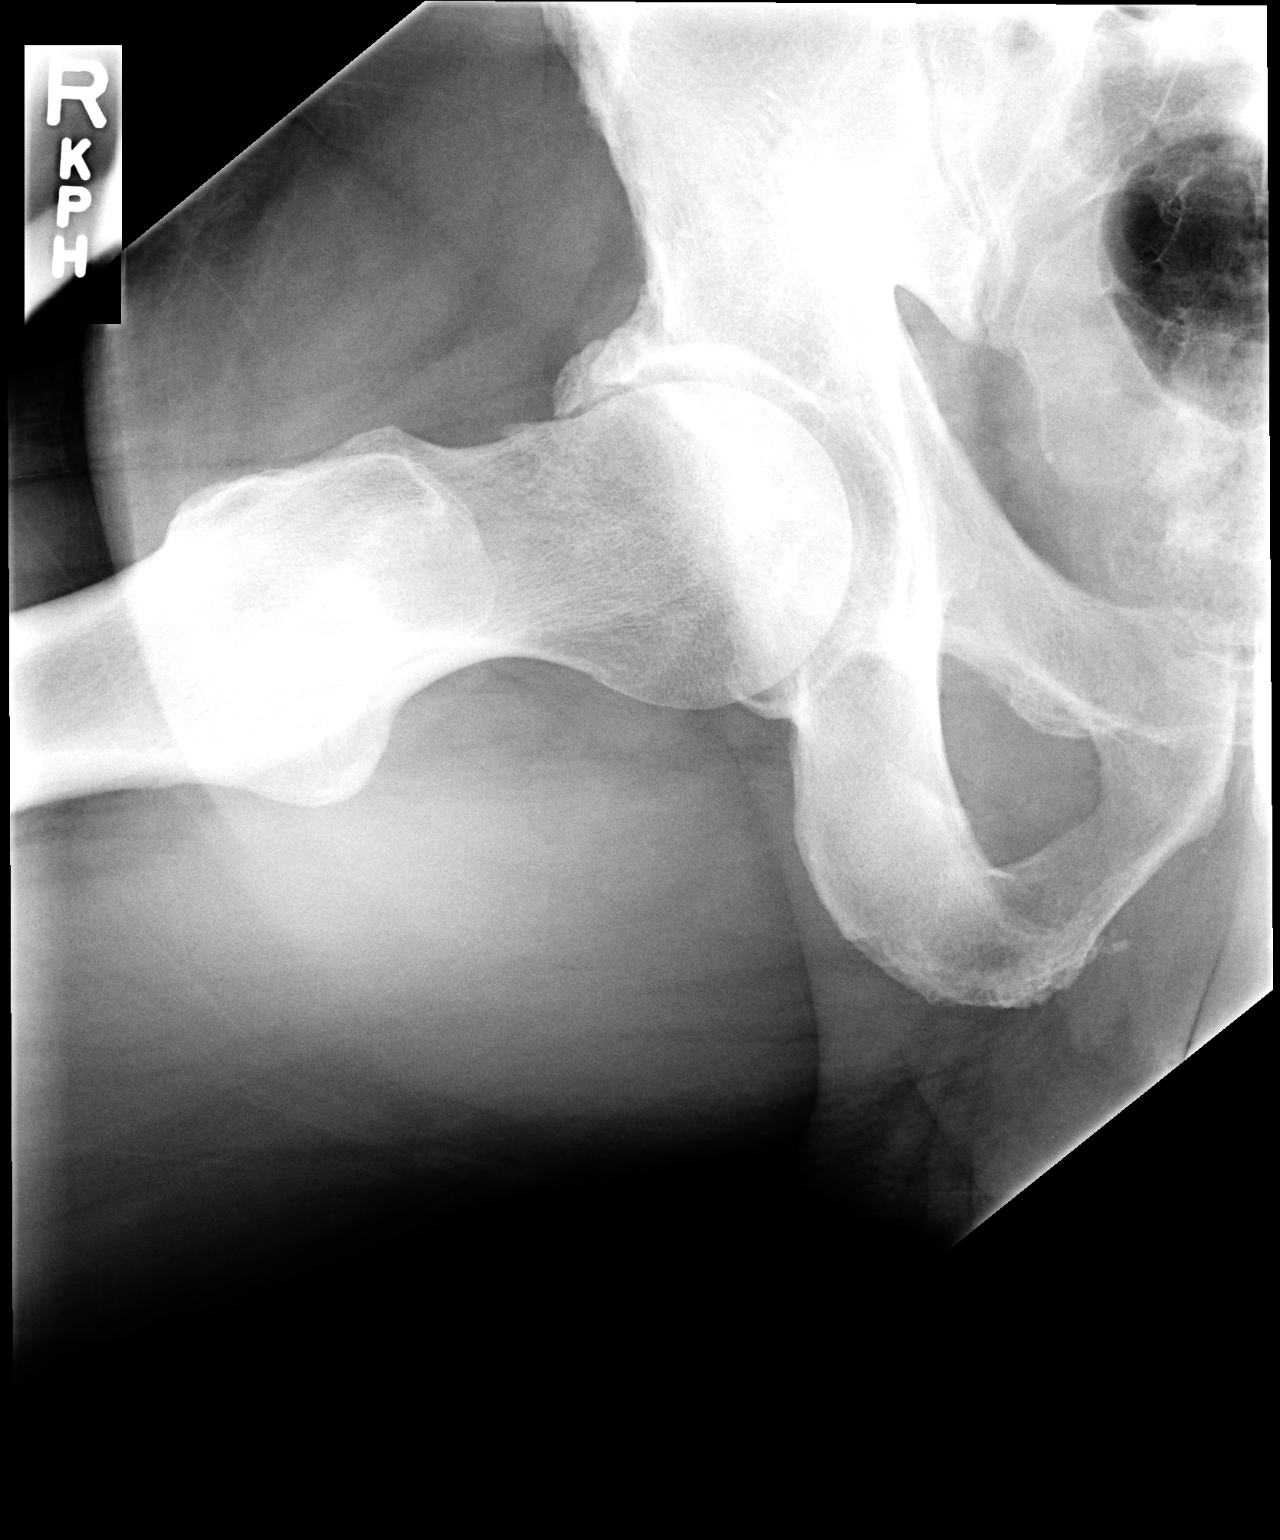

[3 of 3 positions shown; findings below may reference images not displayed]

## 2005-05-30 ENCOUNTER — Ambulatory Visit: Payer: Self-pay | Admitting: Internal Medicine

## 2005-05-30 IMAGING — CR DG LUMBAR SPINE 2-3V
2 series · 2 of 2 positions shown · non-contrast
Comparison: none

CLINICAL DATA: Right hip pain with a clinical concern for the possibility of avascular necrosis.  Previous left total hip replacement.
 3-PHASE NUCLEAR MEDICINE BONE SCAN ? 03/07/04 
 Following the intravenous administration of 25 millicuries of technetium BBm-PJG, flow images, initial static images, and delayed static images of the pelvis, hips, and lower lumbar spine were obtained.  These demonstrate a photopenic area in the lateral aspect of the right femoral head superiorly.  This corresponds to a area of patchy increased density on subsequent right hip radiographs.  Also noted is increased tracer uptake in the lower lumbar spine, corresponding to degenerative changes on subsequent lumbar spine radiographs.  A left hip replacement is noted.

[view not recorded (1 of 2)]
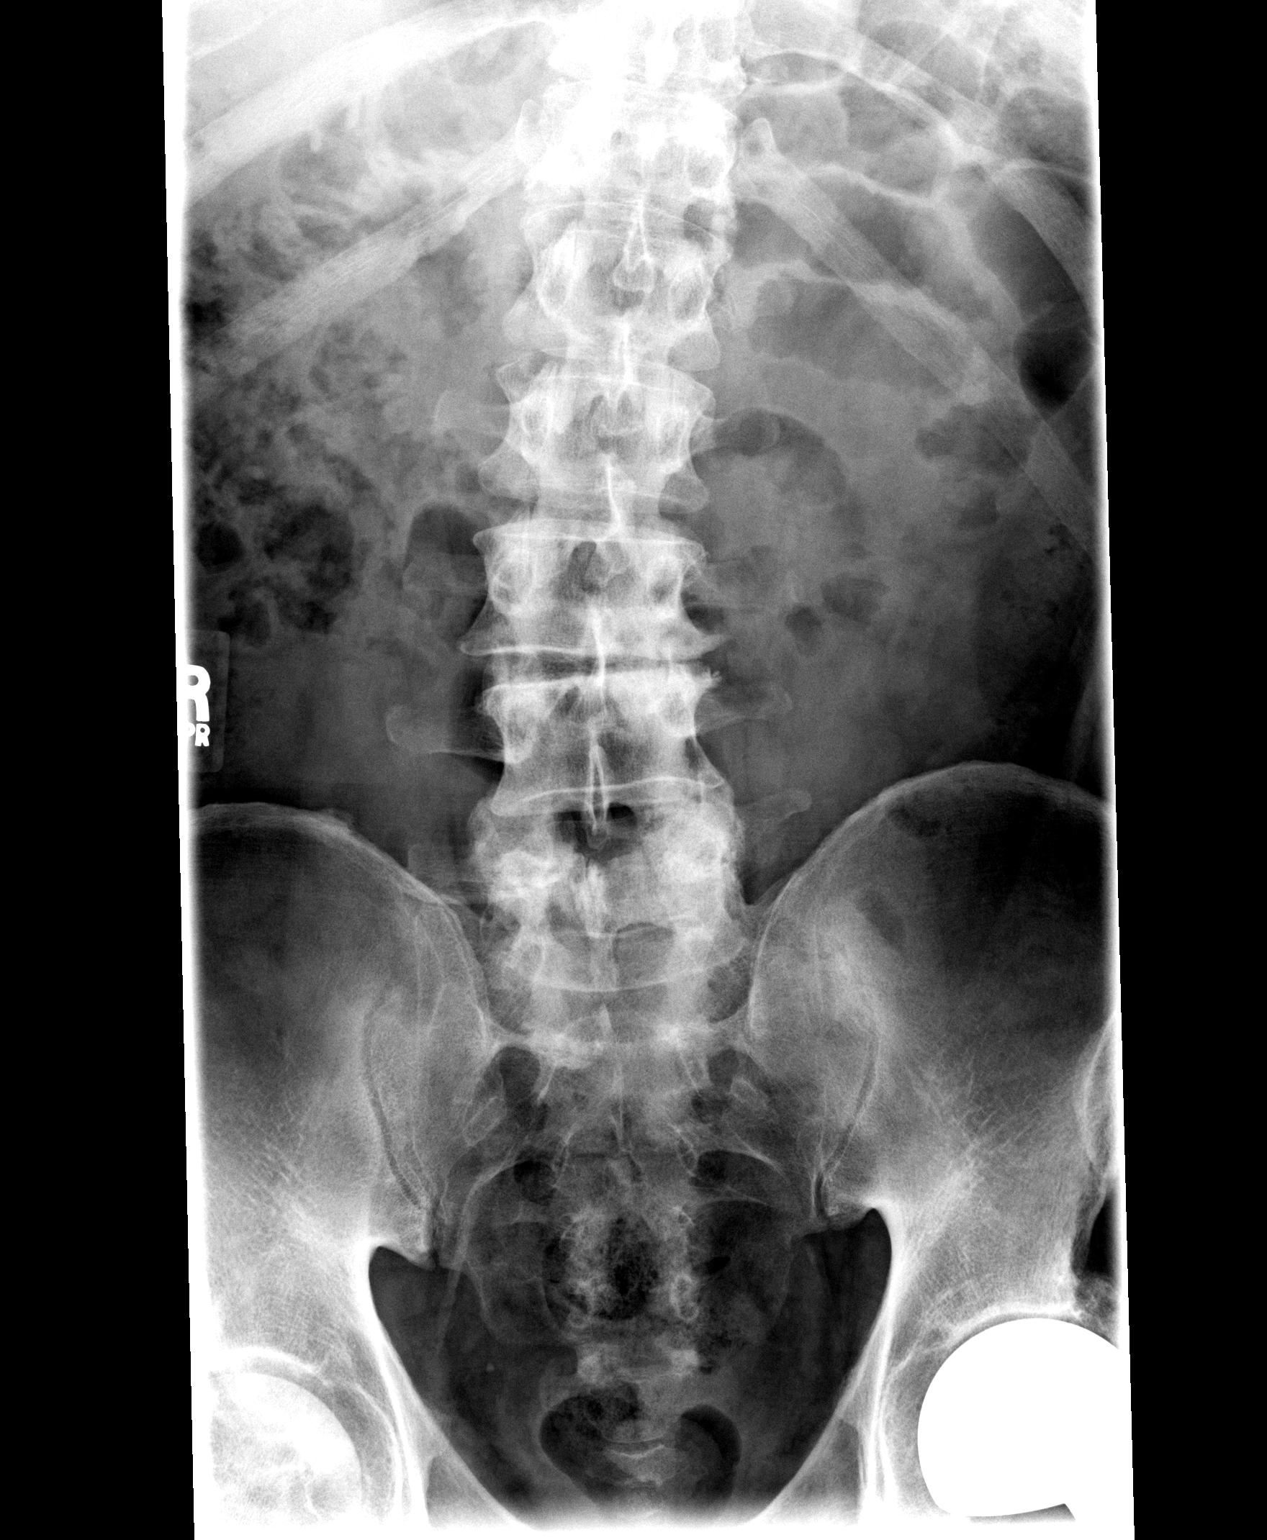

[view not recorded (2 of 2)]
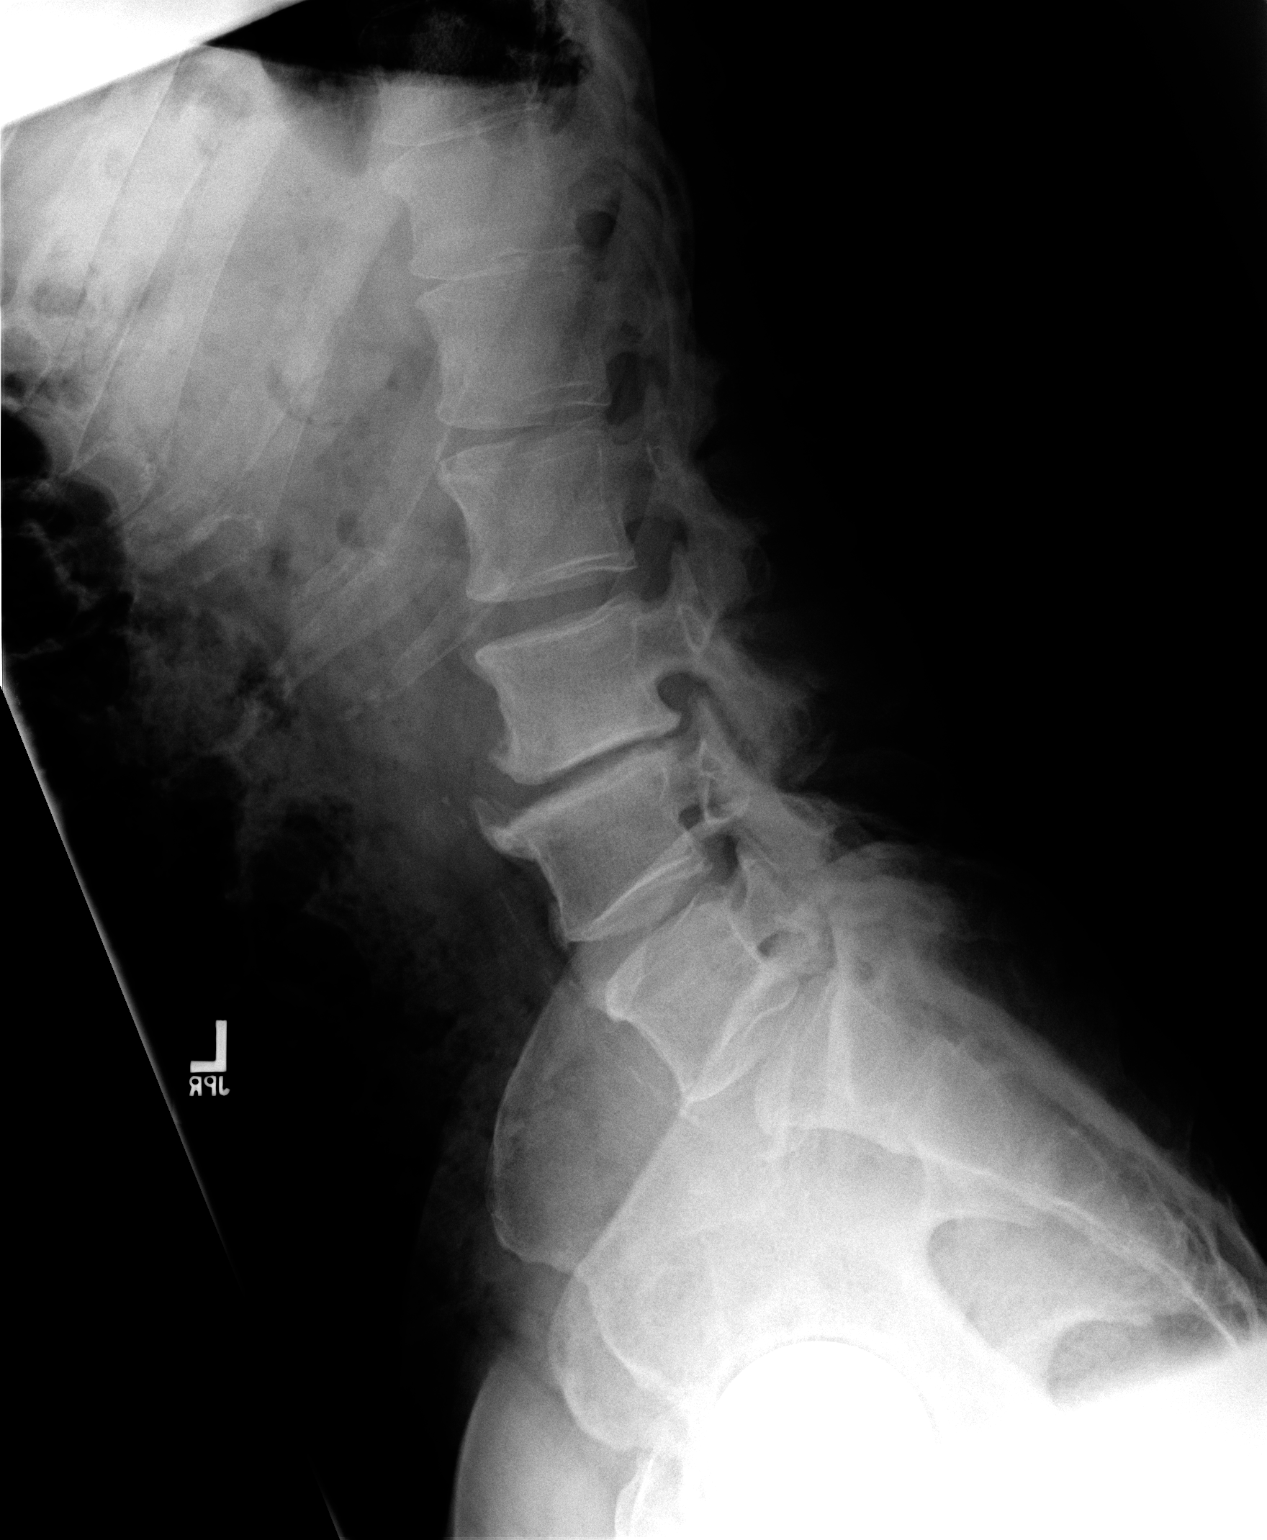

[2 of 2 positions shown; findings below may reference images not displayed]

IMPRESSION: 1.  Right hip avascular necrosis.
 2.  Left hip replacement.
 3.  Lower lumbar spine degenerative changes.
 COMPLETE RIGHT HIP ? 03/07/04
 An AP view of the pelvis and two additional views of the right hip demonstrate patchy sclerosis and lucency in the superior aspect of the femoral head, corresponding to a photopenic area on the radionuclide bone scan.  Also noted is a left total hip prosthesis.
IMPRESSION: Right femoral head avascular necrosis.
 TWO VIEW LUMBAR SPINE ? 03/07/04
 AP and lateral views of the lumbar spine demonstrate five non-rib-bearing lumbar vertebrae and mild to moderate dextroconvex thoracolumbar scoliosis.  Marked disk space narrowing and moderate spur formation on the left at the L3-4 level.  Moderate to marked anterior spur formation at that level.  There are also facet degenerative changes at that level.  These changes correspond to the increased tracer activity on the radionuclide bone scan.  Lower lumbar spine facet degenerative changes are also noted with associated mild anterolisthesis at the L5-S1 level and mild retrolisthesis at the L3-4 level.
IMPRESSION: Scoliosis and degenerative changes, as described above.

## 2005-05-30 IMAGING — NM NM BONE 3 PHASE
2 series · 12 of 12 positions shown · non-contrast
Comparison: none

CLINICAL DATA: Right hip pain with a clinical concern for the possibility of avascular necrosis.  Previous left total hip replacement.
 3-PHASE NUCLEAR MEDICINE BONE SCAN ? 03/07/04 
 Following the intravenous administration of 25 millicuries of technetium BBm-PJG, flow images, initial static images, and delayed static images of the pelvis, hips, and lower lumbar spine were obtained.  These demonstrate a photopenic area in the lateral aspect of the right femoral head superiorly.  This corresponds to a area of patchy increased density on subsequent right hip radiographs.  Also noted is increased tracer uptake in the lower lumbar spine, corresponding to degenerative changes on subsequent lumbar spine radiographs.  A left hip replacement is noted.

[Series 1: bf bone flow · 9.92mm/px · 6 of 36 frames shown (1 of 2)]
[frame 4/36]
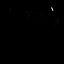
[frame 10/36]
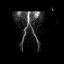
[frame 16/36]
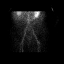
[frame 22/36]
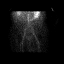
[frame 28/36]
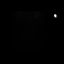
[frame 34/36]
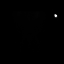

[Series 1: bf bone flow · 9.87mm/px · 6 of 36 frames shown (2 of 2)]
[frame 4/36]
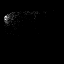
[frame 10/36]
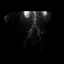
[frame 16/36]
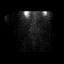
[frame 22/36]
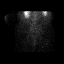
[frame 28/36]
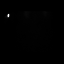
[frame 34/36]
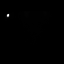

[12 of 12 positions shown; findings below may reference images not displayed]

IMPRESSION: 1.  Right hip avascular necrosis.
 2.  Left hip replacement.
 3.  Lower lumbar spine degenerative changes.
 COMPLETE RIGHT HIP ? 03/07/04
 An AP view of the pelvis and two additional views of the right hip demonstrate patchy sclerosis and lucency in the superior aspect of the femoral head, corresponding to a photopenic area on the radionuclide bone scan.  Also noted is a left total hip prosthesis.
IMPRESSION: Right femoral head avascular necrosis.
 TWO VIEW LUMBAR SPINE ? 03/07/04
 AP and lateral views of the lumbar spine demonstrate five non-rib-bearing lumbar vertebrae and mild to moderate dextroconvex thoracolumbar scoliosis.  Marked disk space narrowing and moderate spur formation on the left at the L3-4 level.  Moderate to marked anterior spur formation at that level.  There are also facet degenerative changes at that level.  These changes correspond to the increased tracer activity on the radionuclide bone scan.  Lower lumbar spine facet degenerative changes are also noted with associated mild anterolisthesis at the L5-S1 level and mild retrolisthesis at the L3-4 level.
IMPRESSION: Scoliosis and degenerative changes, as described above.

## 2005-06-04 ENCOUNTER — Ambulatory Visit: Payer: Self-pay | Admitting: Hospitalist

## 2005-06-05 ENCOUNTER — Inpatient Hospital Stay (HOSPITAL_COMMUNITY): Admission: AD | Admit: 2005-06-05 | Discharge: 2005-06-07 | Payer: Self-pay | Admitting: Cardiovascular Disease

## 2005-06-06 ENCOUNTER — Encounter (INDEPENDENT_AMBULATORY_CARE_PROVIDER_SITE_OTHER): Payer: Self-pay | Admitting: Cardiovascular Disease

## 2005-06-10 ENCOUNTER — Encounter (INDEPENDENT_AMBULATORY_CARE_PROVIDER_SITE_OTHER): Payer: Self-pay | Admitting: Internal Medicine

## 2005-07-18 IMAGING — CR DG HIP (WITH OR WITHOUT PELVIS) 2-3V*L*
4 series · 4 of 4 positions shown · non-contrast
Comparison: none

CLINICAL DATA: Recent fall.  Left hip pain.  
 LEFT HIP WITH PELVIS - 3 VIEW:
 A left hip prosthesis is seen in expected position.  There is no evidence of fracture or dislocation.  There is no evidence of pelvic fracture or diastasis.  No focal bone lesions are seen.  Mild to moderate right hip osteoarthritis is seen.

[view not recorded (1 of 4)]
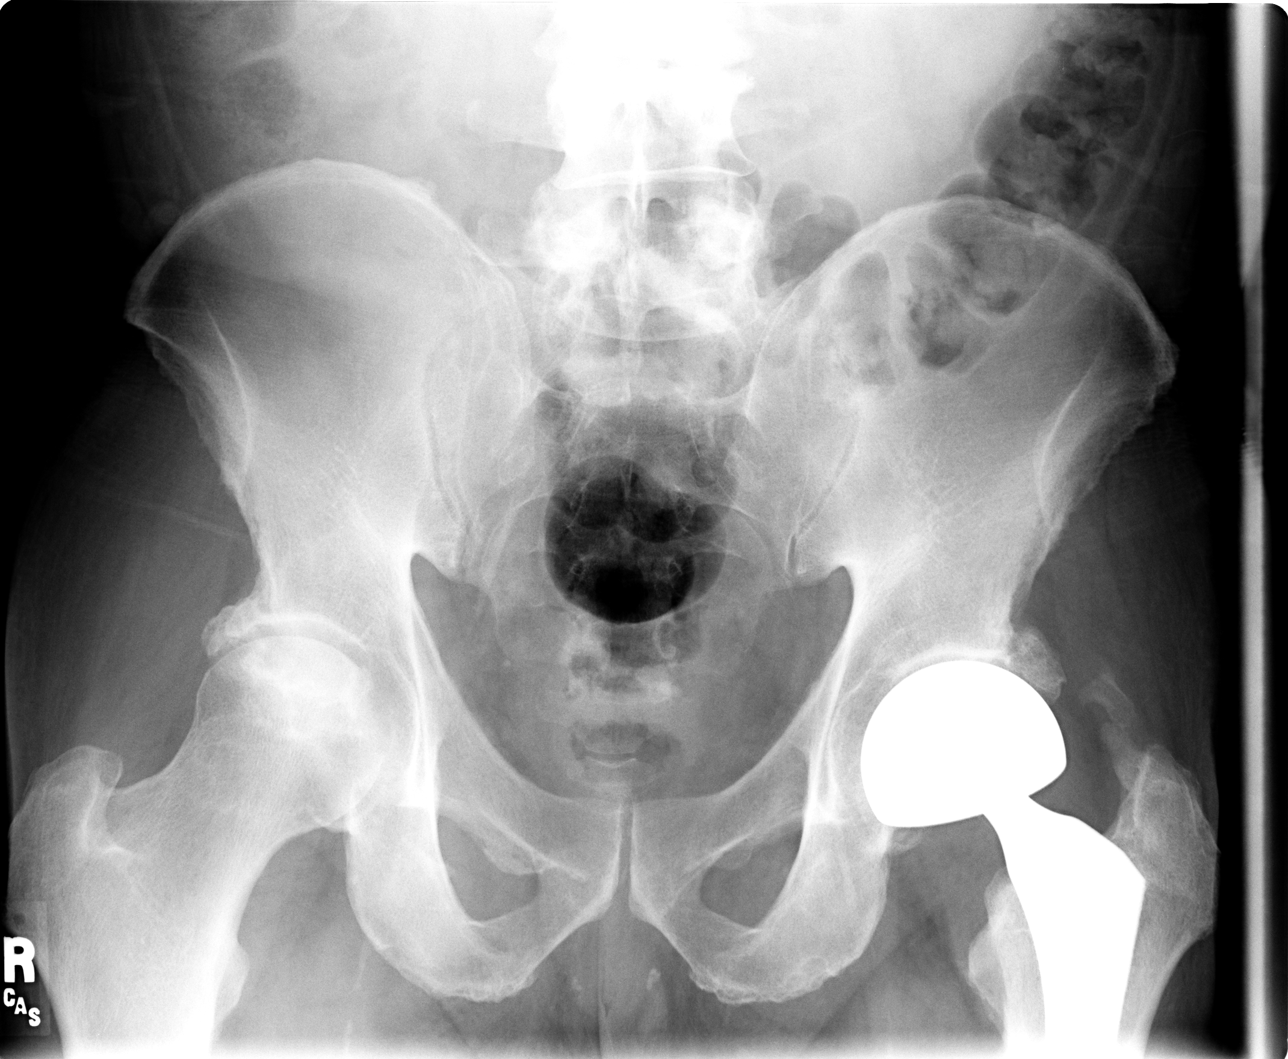

[view not recorded (2 of 4)]
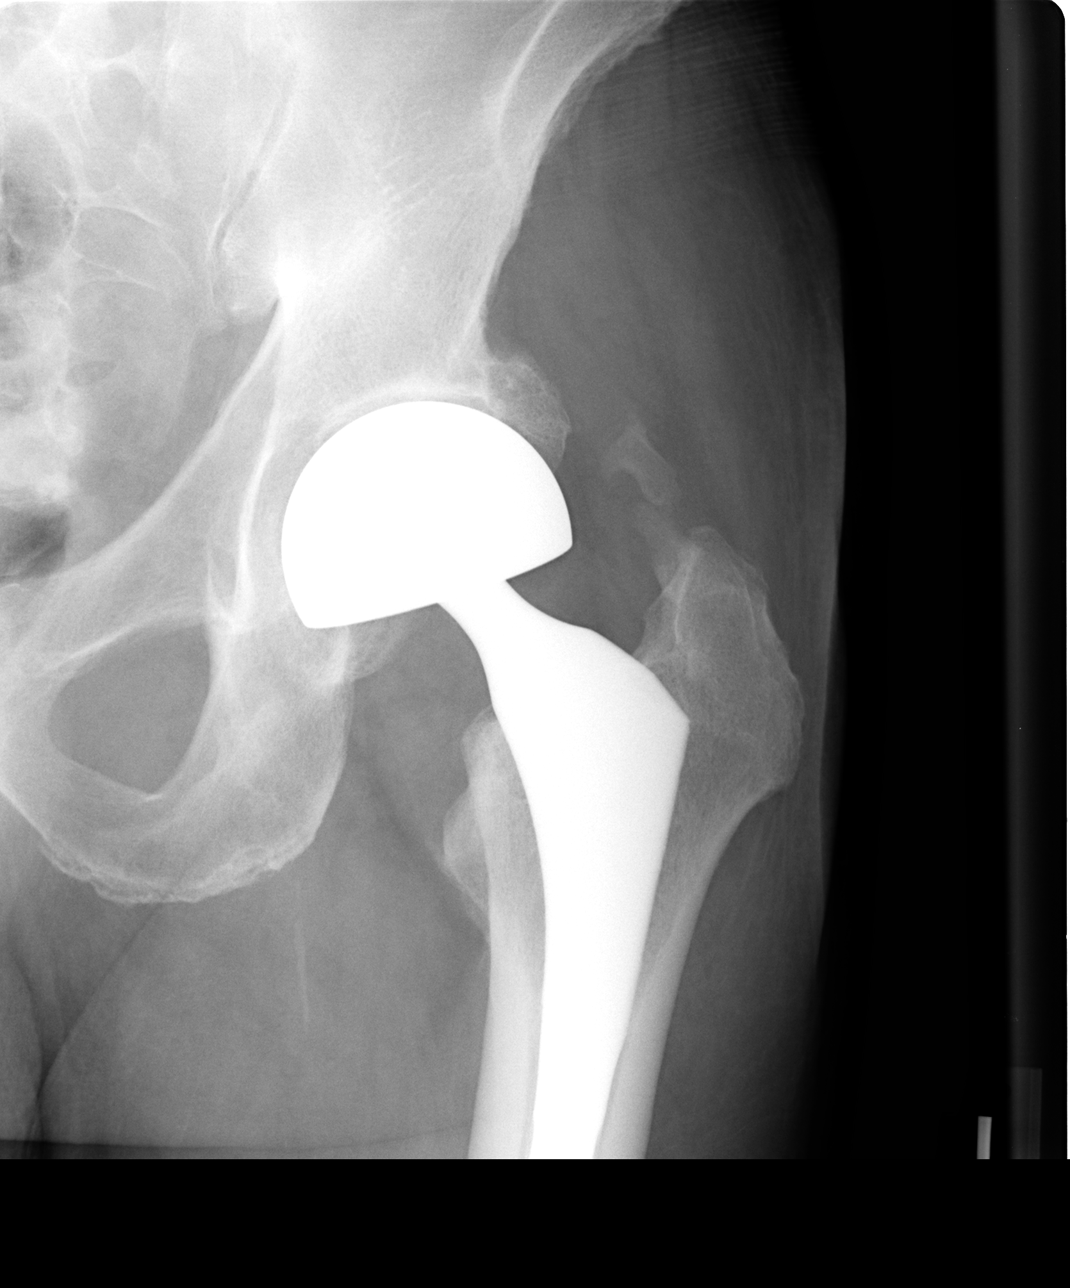

[view not recorded (3 of 4)]
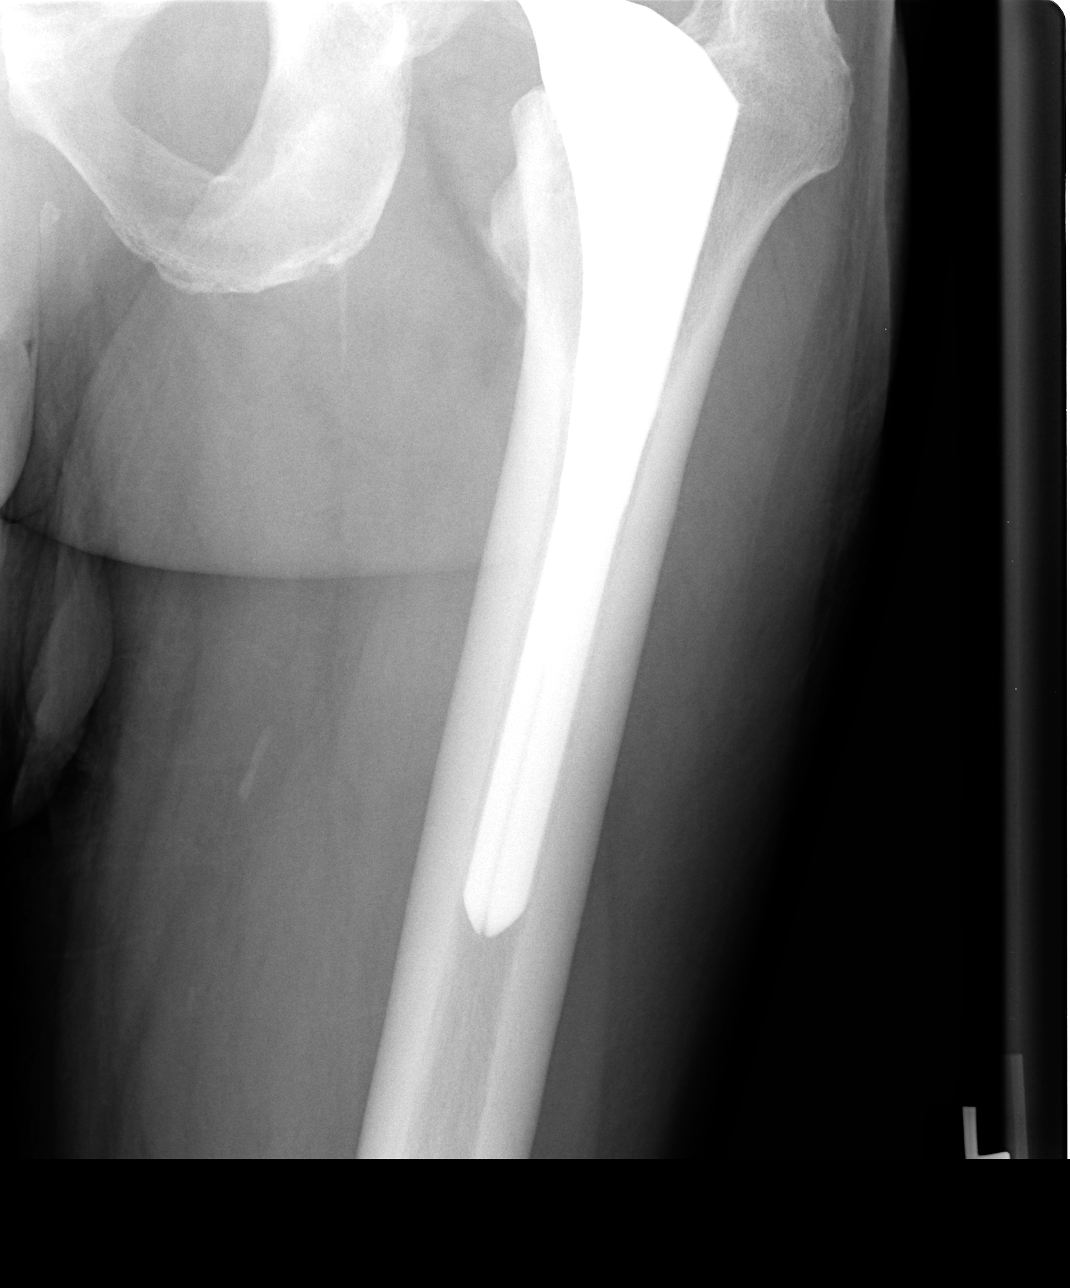

[view not recorded (4 of 4)]
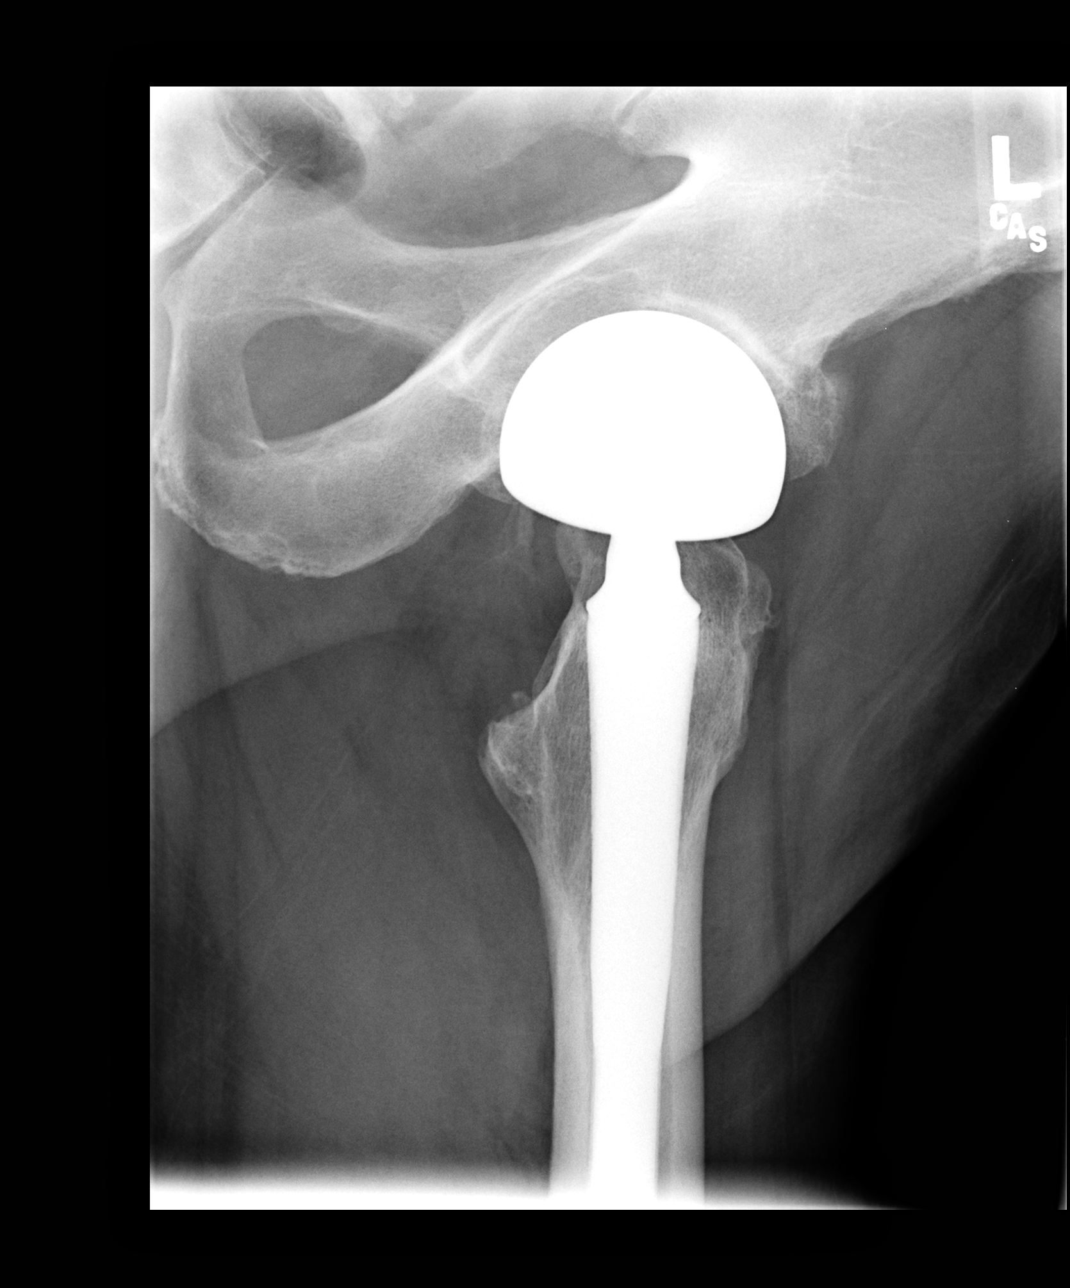

[4 of 4 positions shown; findings below may reference images not displayed]

IMPRESSION: 1.  Left hip prosthesis in appropriate position.  No evidence of fracture or dislocation.
 2.  Mild to moderate right hip osteoarthritis.

## 2005-08-06 ENCOUNTER — Ambulatory Visit: Payer: Self-pay | Admitting: Hospitalist

## 2005-08-14 ENCOUNTER — Ambulatory Visit: Payer: Self-pay | Admitting: Internal Medicine

## 2005-08-21 ENCOUNTER — Emergency Department (HOSPITAL_COMMUNITY): Admission: EM | Admit: 2005-08-21 | Discharge: 2005-08-21 | Payer: Self-pay | Admitting: *Deleted

## 2005-09-26 ENCOUNTER — Ambulatory Visit: Payer: Self-pay | Admitting: Internal Medicine

## 2005-10-04 ENCOUNTER — Ambulatory Visit: Payer: Self-pay | Admitting: Internal Medicine

## 2005-10-18 ENCOUNTER — Ambulatory Visit: Payer: Self-pay | Admitting: Internal Medicine

## 2005-10-21 ENCOUNTER — Ambulatory Visit: Payer: Self-pay | Admitting: Internal Medicine

## 2005-10-23 ENCOUNTER — Ambulatory Visit: Payer: Self-pay | Admitting: Internal Medicine

## 2005-10-23 ENCOUNTER — Inpatient Hospital Stay (HOSPITAL_COMMUNITY): Admission: AD | Admit: 2005-10-23 | Discharge: 2005-10-26 | Payer: Self-pay | Admitting: Internal Medicine

## 2005-10-28 ENCOUNTER — Ambulatory Visit: Payer: Self-pay | Admitting: Internal Medicine

## 2005-11-01 ENCOUNTER — Ambulatory Visit: Payer: Self-pay | Admitting: Internal Medicine

## 2005-11-06 ENCOUNTER — Ambulatory Visit: Payer: Self-pay | Admitting: Internal Medicine

## 2005-11-06 ENCOUNTER — Observation Stay (HOSPITAL_COMMUNITY): Admission: AD | Admit: 2005-11-06 | Discharge: 2005-11-07 | Payer: Self-pay | Admitting: Internal Medicine

## 2005-11-06 ENCOUNTER — Ambulatory Visit (HOSPITAL_COMMUNITY): Admission: RE | Admit: 2005-11-06 | Discharge: 2005-11-06 | Payer: Self-pay | Admitting: Internal Medicine

## 2005-11-12 ENCOUNTER — Ambulatory Visit: Payer: Self-pay | Admitting: Internal Medicine

## 2005-11-18 ENCOUNTER — Ambulatory Visit: Payer: Self-pay | Admitting: Internal Medicine

## 2005-12-14 IMAGING — CR DG CHEST 2V
2 series · 2 of 2 positions shown · non-contrast
Comparison: None.

CLINICAL DATA: Dyspnea and cough.
 CHEST - 2 VIEW ? 09/21/04:

[w chest pa]
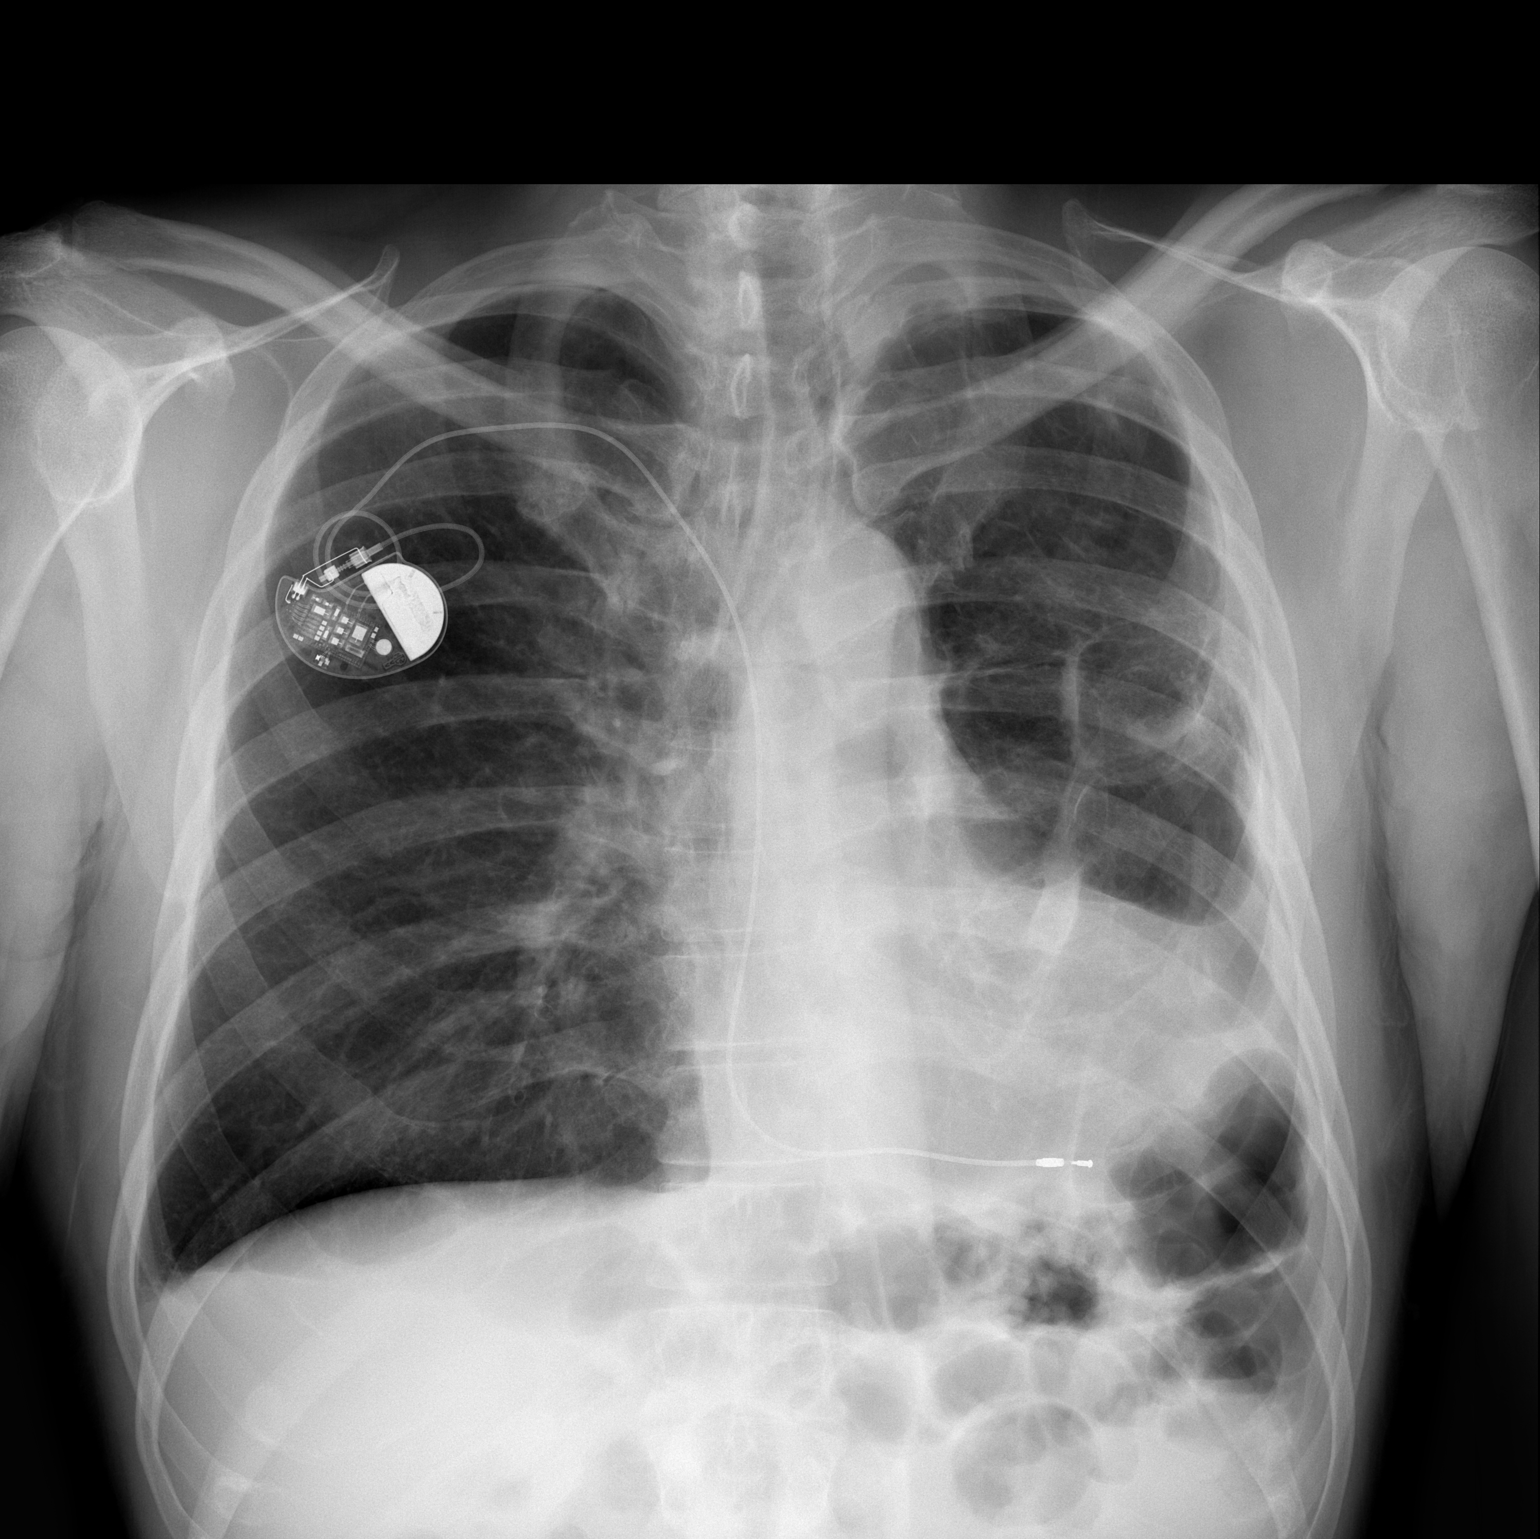

[w chest lat]
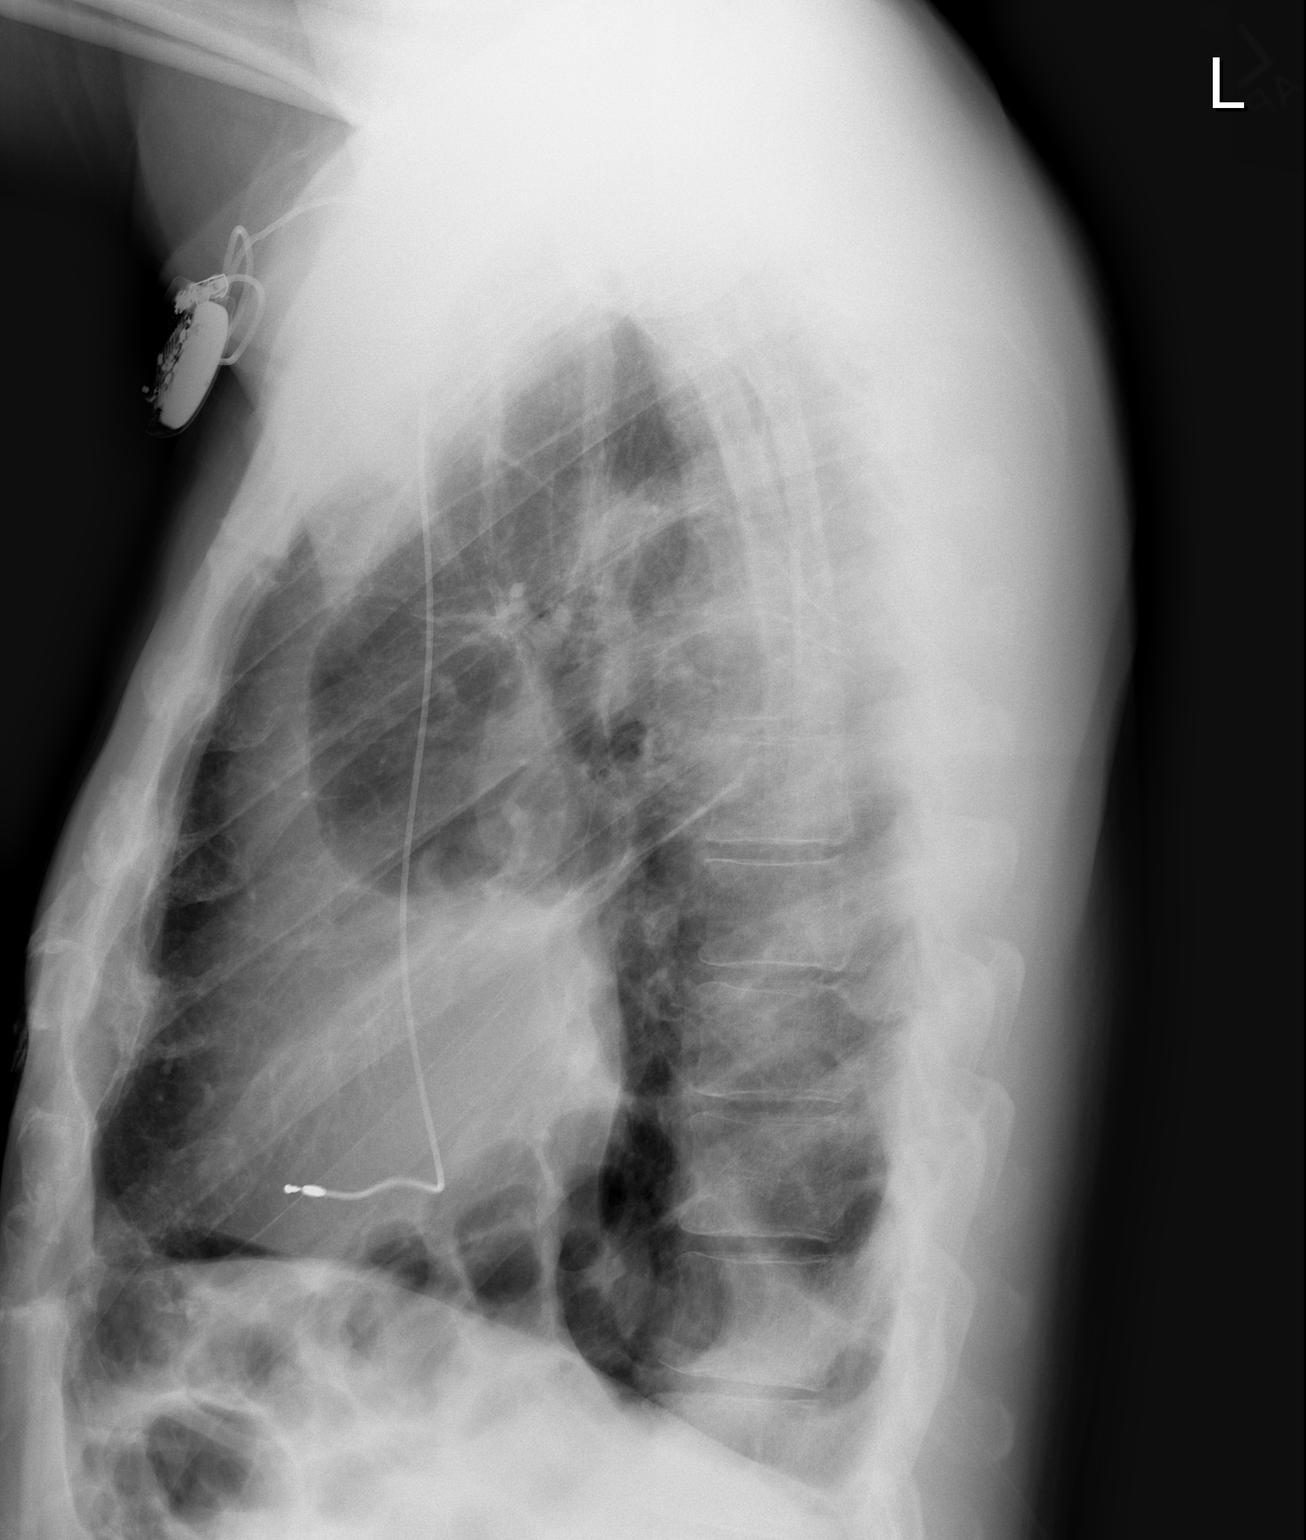

[2 of 2 positions shown; findings below may reference images not displayed]

10/11/03.
 Pleuroparenchymal scarring in the left base and left apex is stable.  The right lung is hyperinflated but clear.  A right permanent pacemaker remains in place.  The bony structures of the imaged thorax are intact.
IMPRESSION: Stable chronic changes in the left lung.  No acute or new process identified.

## 2006-01-24 ENCOUNTER — Ambulatory Visit: Payer: Self-pay | Admitting: Hospitalist

## 2006-02-24 ENCOUNTER — Ambulatory Visit: Payer: Self-pay | Admitting: Internal Medicine

## 2006-03-01 DIAGNOSIS — I81 Portal vein thrombosis: Secondary | ICD-10-CM

## 2006-03-01 DIAGNOSIS — F528 Other sexual dysfunction not due to a substance or known physiological condition: Secondary | ICD-10-CM

## 2006-03-01 DIAGNOSIS — M169 Osteoarthritis of hip, unspecified: Secondary | ICD-10-CM

## 2006-03-01 DIAGNOSIS — K2970 Gastritis, unspecified, without bleeding: Secondary | ICD-10-CM | POA: Insufficient documentation

## 2006-03-01 DIAGNOSIS — Z87891 Personal history of nicotine dependence: Secondary | ICD-10-CM

## 2006-03-01 DIAGNOSIS — M479 Spondylosis, unspecified: Secondary | ICD-10-CM | POA: Insufficient documentation

## 2006-03-01 DIAGNOSIS — Z950 Presence of cardiac pacemaker: Secondary | ICD-10-CM

## 2006-03-01 DIAGNOSIS — I10 Essential (primary) hypertension: Secondary | ICD-10-CM

## 2006-03-01 DIAGNOSIS — K2990 Gastroduodenitis, unspecified, without bleeding: Secondary | ICD-10-CM

## 2006-03-01 DIAGNOSIS — M879 Osteonecrosis, unspecified: Secondary | ICD-10-CM

## 2006-03-10 ENCOUNTER — Ambulatory Visit: Payer: Self-pay | Admitting: Internal Medicine

## 2006-03-11 ENCOUNTER — Ambulatory Visit: Payer: Self-pay | Admitting: Internal Medicine

## 2006-06-02 ENCOUNTER — Ambulatory Visit (HOSPITAL_COMMUNITY): Admission: RE | Admit: 2006-06-02 | Discharge: 2006-06-02 | Payer: Self-pay | Admitting: Internal Medicine

## 2006-06-02 ENCOUNTER — Ambulatory Visit: Payer: Self-pay | Admitting: Internal Medicine

## 2006-06-02 ENCOUNTER — Encounter: Payer: Self-pay | Admitting: Vascular Surgery

## 2006-06-02 ENCOUNTER — Encounter (INDEPENDENT_AMBULATORY_CARE_PROVIDER_SITE_OTHER): Payer: Self-pay | Admitting: Internal Medicine

## 2006-06-02 DIAGNOSIS — M549 Dorsalgia, unspecified: Secondary | ICD-10-CM | POA: Insufficient documentation

## 2006-06-02 DIAGNOSIS — F329 Major depressive disorder, single episode, unspecified: Secondary | ICD-10-CM

## 2006-06-03 LAB — CONVERTED CEMR LAB
Albumin: 3.8 g/dL (ref 3.5–5.2)
BUN: 15 mg/dL (ref 6–23)
CO2: 29 meq/L (ref 19–32)
Calcium: 9.5 mg/dL (ref 8.4–10.5)
Chloride: 108 meq/L (ref 96–112)
Digitoxin Lvl: 0.7 ng/mL — ABNORMAL LOW (ref 0.8–2.0)
Eosinophils Relative: 2 % (ref 0–5)
Glucose, Bld: 83 mg/dL (ref 70–99)
HCT: 48.5 % (ref 39.0–52.0)
Hemoglobin: 16.1 g/dL (ref 13.0–17.0)
Lymphocytes Relative: 32 % (ref 12–46)
Lymphs Abs: 2.4 10*3/uL (ref 0.7–3.3)
Monocytes Absolute: 0.7 10*3/uL (ref 0.2–0.7)
Monocytes Relative: 9 % (ref 3–11)
Potassium: 4.5 meq/L (ref 3.5–5.3)
Prothrombin Time: 18.6 s — ABNORMAL HIGH (ref 11.6–15.2)
RBC: 5.24 M/uL (ref 4.22–5.81)
RDW: 18.2 % — ABNORMAL HIGH (ref 11.5–14.0)
Total Protein: 6.7 g/dL (ref 6.0–8.3)
Valproic Acid Lvl: <10 ug/mL — ABNORMAL LOW (ref 50.0–100.0)

## 2006-06-12 ENCOUNTER — Telehealth (INDEPENDENT_AMBULATORY_CARE_PROVIDER_SITE_OTHER): Payer: Self-pay | Admitting: *Deleted

## 2006-06-17 ENCOUNTER — Telehealth (INDEPENDENT_AMBULATORY_CARE_PROVIDER_SITE_OTHER): Payer: Self-pay | Admitting: Pharmacy Technician

## 2006-06-30 ENCOUNTER — Telehealth: Payer: Self-pay | Admitting: *Deleted

## 2006-07-28 ENCOUNTER — Telehealth (INDEPENDENT_AMBULATORY_CARE_PROVIDER_SITE_OTHER): Payer: Self-pay | Admitting: Hospitalist

## 2006-07-30 ENCOUNTER — Ambulatory Visit: Payer: Self-pay | Admitting: Hospitalist

## 2006-07-31 LAB — CONVERTED CEMR LAB
Barbiturate Quant, Ur: NEGATIVE
Benzodiazepines.: NEGATIVE
Methadone: NEGATIVE
Propoxyphene: NEGATIVE

## 2006-08-28 ENCOUNTER — Ambulatory Visit (HOSPITAL_COMMUNITY): Admission: RE | Admit: 2006-08-28 | Discharge: 2006-08-28 | Payer: Self-pay | Admitting: Internal Medicine

## 2006-08-28 ENCOUNTER — Ambulatory Visit: Payer: Self-pay | Admitting: Internal Medicine

## 2006-08-28 ENCOUNTER — Encounter: Admission: RE | Admit: 2006-08-28 | Discharge: 2006-08-28 | Payer: Self-pay | Admitting: Internal Medicine

## 2006-08-28 ENCOUNTER — Encounter (INDEPENDENT_AMBULATORY_CARE_PROVIDER_SITE_OTHER): Payer: Self-pay | Admitting: Internal Medicine

## 2006-08-28 DIAGNOSIS — IMO0001 Reserved for inherently not codable concepts without codable children: Secondary | ICD-10-CM

## 2006-08-28 DIAGNOSIS — N508 Other specified disorders of male genital organs: Secondary | ICD-10-CM

## 2006-08-28 DIAGNOSIS — J441 Chronic obstructive pulmonary disease with (acute) exacerbation: Secondary | ICD-10-CM

## 2006-08-29 ENCOUNTER — Telehealth: Payer: Self-pay | Admitting: *Deleted

## 2006-09-03 ENCOUNTER — Ambulatory Visit (HOSPITAL_COMMUNITY): Admission: RE | Admit: 2006-09-03 | Discharge: 2006-09-03 | Payer: Self-pay | Admitting: Internal Medicine

## 2006-09-03 ENCOUNTER — Encounter (INDEPENDENT_AMBULATORY_CARE_PROVIDER_SITE_OTHER): Payer: Self-pay | Admitting: Internal Medicine

## 2006-09-03 LAB — CONVERTED CEMR LAB
ALT: 8 units/L (ref 0–53)
Albumin: 4.6 g/dL (ref 3.5–5.2)
Alkaline Phosphatase: 70 units/L (ref 39–117)
Barbiturate Quant, Ur: NEGATIVE
Basophils Relative: 0 % (ref 0–1)
CO2: 24 meq/L (ref 19–32)
Cocaine Metabolites: NEGATIVE
Creatinine,U: 262.1 mg/dL
Free T4: 1.24 ng/dL (ref 0.89–1.80)
Glucose, Bld: 73 mg/dL (ref 70–99)
Hemoglobin, Urine: NEGATIVE
LDH: 164 units/L (ref 94–250)
Lymphs Abs: 3.2 10*3/uL (ref 0.7–3.3)
Methadone: NEGATIVE
Monocytes Relative: 6 % (ref 3–11)
Neutro Abs: 3.6 10*3/uL (ref 1.7–7.7)
Neutrophils Relative %: 48 % (ref 43–77)
Nitrite: NEGATIVE
Opiates: POSITIVE — AB
Platelets: 148 10*3/uL — ABNORMAL LOW (ref 150–400)
Potassium: 4.4 meq/L (ref 3.5–5.3)
Propoxyphene: NEGATIVE
RBC: 5.19 M/uL (ref 4.22–5.81)
Sed Rate: 1 mm/hr (ref 0–16)
Sodium: 144 meq/L (ref 135–145)
Specific Gravity, Urine: 1.029 (ref 1.005–1.03)
Total Bilirubin: 1.1 mg/dL (ref 0.3–1.2)
Total Protein: 7.2 g/dL (ref 6.0–8.3)
Urine Glucose: NEGATIVE mg/dL
WBC: 7.4 10*3/uL (ref 4.0–10.5)
pH: 6.5 (ref 5.0–8.0)

## 2006-09-04 ENCOUNTER — Ambulatory Visit: Payer: Self-pay | Admitting: Internal Medicine

## 2006-09-04 ENCOUNTER — Ambulatory Visit (HOSPITAL_COMMUNITY): Admission: RE | Admit: 2006-09-04 | Discharge: 2006-09-04 | Payer: Self-pay | Admitting: Internal Medicine

## 2006-09-04 DIAGNOSIS — N433 Hydrocele, unspecified: Secondary | ICD-10-CM | POA: Insufficient documentation

## 2006-09-04 DIAGNOSIS — K219 Gastro-esophageal reflux disease without esophagitis: Secondary | ICD-10-CM

## 2006-09-05 DIAGNOSIS — M1990 Unspecified osteoarthritis, unspecified site: Secondary | ICD-10-CM | POA: Insufficient documentation

## 2006-09-17 ENCOUNTER — Telehealth: Payer: Self-pay | Admitting: *Deleted

## 2006-09-17 ENCOUNTER — Telehealth (INDEPENDENT_AMBULATORY_CARE_PROVIDER_SITE_OTHER): Payer: Self-pay | Admitting: Internal Medicine

## 2006-09-19 ENCOUNTER — Ambulatory Visit (HOSPITAL_COMMUNITY): Admission: RE | Admit: 2006-09-19 | Discharge: 2006-09-19 | Payer: Self-pay | Admitting: Internal Medicine

## 2006-09-19 ENCOUNTER — Encounter (INDEPENDENT_AMBULATORY_CARE_PROVIDER_SITE_OTHER): Payer: Self-pay | Admitting: Internal Medicine

## 2006-09-19 ENCOUNTER — Ambulatory Visit: Payer: Self-pay | Admitting: Internal Medicine

## 2006-10-15 ENCOUNTER — Telehealth: Payer: Self-pay | Admitting: *Deleted

## 2006-10-17 ENCOUNTER — Ambulatory Visit (HOSPITAL_COMMUNITY): Admission: RE | Admit: 2006-10-17 | Discharge: 2006-10-17 | Payer: Self-pay | Admitting: Internal Medicine

## 2006-10-20 ENCOUNTER — Encounter: Payer: Self-pay | Admitting: Internal Medicine

## 2006-10-20 ENCOUNTER — Telehealth (INDEPENDENT_AMBULATORY_CARE_PROVIDER_SITE_OTHER): Payer: Self-pay | Admitting: *Deleted

## 2006-10-24 ENCOUNTER — Ambulatory Visit: Payer: Self-pay | Admitting: Internal Medicine

## 2006-11-03 ENCOUNTER — Telehealth (INDEPENDENT_AMBULATORY_CARE_PROVIDER_SITE_OTHER): Payer: Self-pay | Admitting: *Deleted

## 2006-11-04 ENCOUNTER — Encounter (INDEPENDENT_AMBULATORY_CARE_PROVIDER_SITE_OTHER): Payer: Self-pay | Admitting: Internal Medicine

## 2006-11-04 ENCOUNTER — Telehealth: Payer: Self-pay | Admitting: *Deleted

## 2006-11-04 LAB — CONVERTED CEMR LAB
Barbiturate Quant, Ur: NEGATIVE
Creatinine,U: 260.5 mg/dL
Marijuana Metabolite: POSITIVE — AB
Opiates: POSITIVE — AB
Phencyclidine (PCP): NEGATIVE
Propoxyphene: NEGATIVE

## 2006-11-06 ENCOUNTER — Encounter: Payer: Self-pay | Admitting: Internal Medicine

## 2006-11-06 ENCOUNTER — Ambulatory Visit (HOSPITAL_COMMUNITY): Admission: RE | Admit: 2006-11-06 | Discharge: 2006-11-06 | Payer: Self-pay | Admitting: Internal Medicine

## 2006-11-06 ENCOUNTER — Ambulatory Visit: Payer: Self-pay | Admitting: Internal Medicine

## 2006-11-06 ENCOUNTER — Telehealth (INDEPENDENT_AMBULATORY_CARE_PROVIDER_SITE_OTHER): Payer: Self-pay | Admitting: *Deleted

## 2006-11-13 ENCOUNTER — Encounter (INDEPENDENT_AMBULATORY_CARE_PROVIDER_SITE_OTHER): Payer: Self-pay | Admitting: Internal Medicine

## 2006-11-13 ENCOUNTER — Encounter: Payer: Self-pay | Admitting: Internal Medicine

## 2006-11-13 ENCOUNTER — Ambulatory Visit (HOSPITAL_COMMUNITY): Admission: RE | Admit: 2006-11-13 | Discharge: 2006-11-13 | Payer: Self-pay | Admitting: Internal Medicine

## 2006-11-18 ENCOUNTER — Telehealth: Payer: Self-pay | Admitting: *Deleted

## 2006-12-05 ENCOUNTER — Encounter (INDEPENDENT_AMBULATORY_CARE_PROVIDER_SITE_OTHER): Payer: Self-pay | Admitting: Internal Medicine

## 2006-12-05 ENCOUNTER — Ambulatory Visit: Payer: Self-pay | Admitting: Hospitalist

## 2006-12-19 ENCOUNTER — Encounter (INDEPENDENT_AMBULATORY_CARE_PROVIDER_SITE_OTHER): Payer: Self-pay | Admitting: Hospitalist

## 2006-12-25 ENCOUNTER — Encounter (INDEPENDENT_AMBULATORY_CARE_PROVIDER_SITE_OTHER): Payer: Self-pay | Admitting: Internal Medicine

## 2006-12-25 ENCOUNTER — Ambulatory Visit: Payer: Self-pay | Admitting: Critical Care Medicine

## 2006-12-30 ENCOUNTER — Telehealth: Payer: Self-pay | Admitting: *Deleted

## 2007-01-07 ENCOUNTER — Ambulatory Visit: Payer: Self-pay | Admitting: Internal Medicine

## 2007-01-07 ENCOUNTER — Ambulatory Visit (HOSPITAL_COMMUNITY): Admission: RE | Admit: 2007-01-07 | Discharge: 2007-01-07 | Payer: Self-pay | Admitting: Internal Medicine

## 2007-01-07 ENCOUNTER — Encounter (INDEPENDENT_AMBULATORY_CARE_PROVIDER_SITE_OTHER): Payer: Self-pay | Admitting: Internal Medicine

## 2007-01-07 LAB — CONVERTED CEMR LAB
BUN: 18 mg/dL (ref 6–23)
Calcium: 9.4 mg/dL (ref 8.4–10.5)
Chloride: 110 meq/L (ref 96–112)
Creatinine, Ser: 1.05 mg/dL (ref 0.40–1.50)

## 2007-01-30 IMAGING — US US RENAL
1 series · 14 of 23 positions shown · non-contrast
Comparison: None.

CLINICAL DATA: Renal failure.  Hypertension.
 RENAL/URINARY TRACT ULTRASOUND:
TECHNIQUE: Complete ultrasound examination of the urinary tract was performed including evaluation of the kidneys, renal collecting systems, and urinary bladder.

[Series 1: unknown · 0.28mm/px · 14 of 23 slices shown]
[im 1/23]
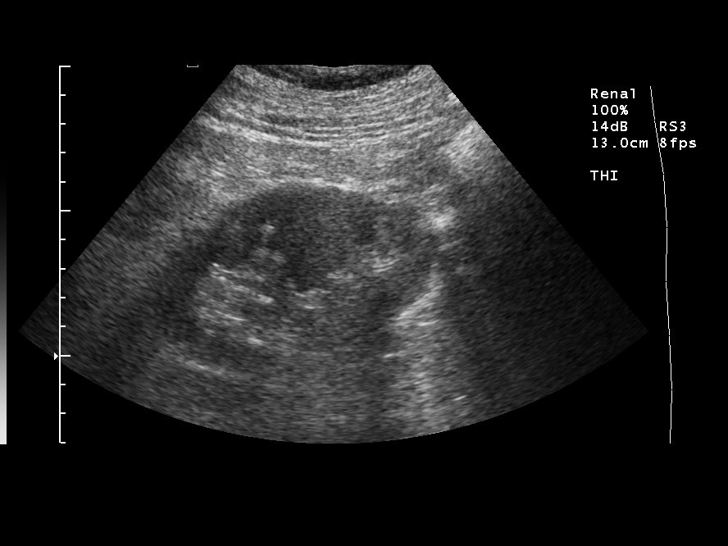
[im 3/23]
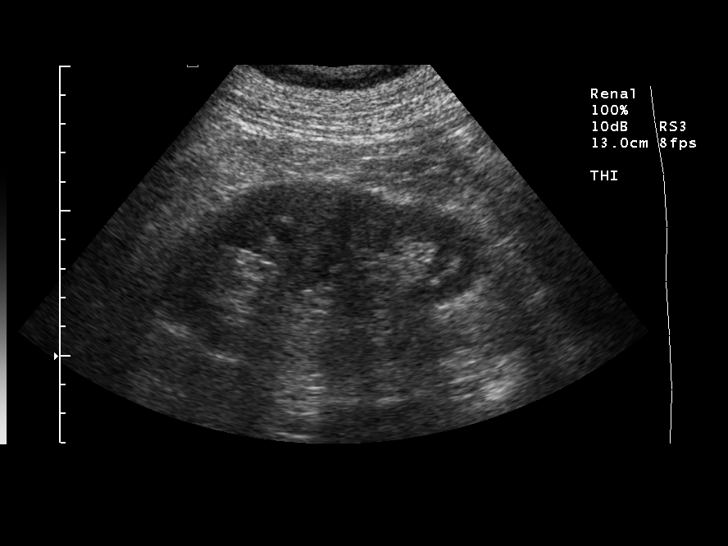
[im 5/23]
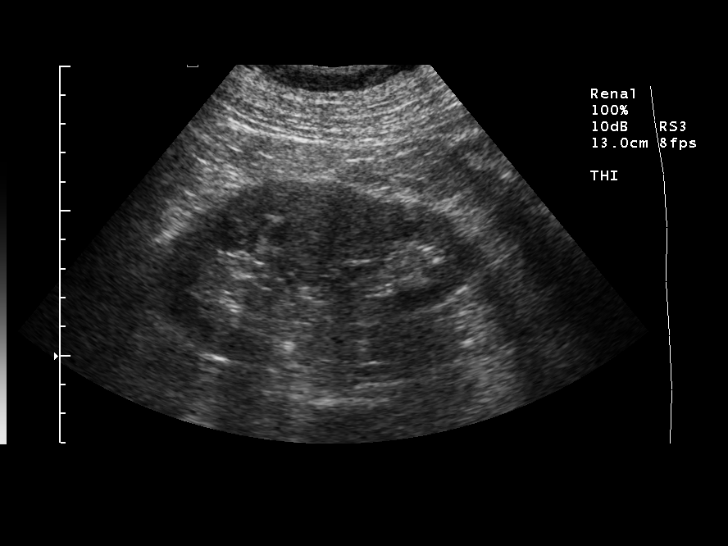
[im 6/23]
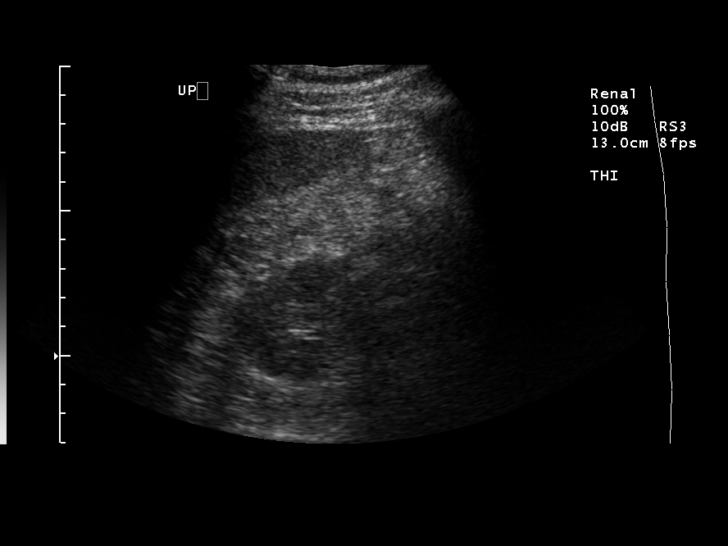
[im 8/23]
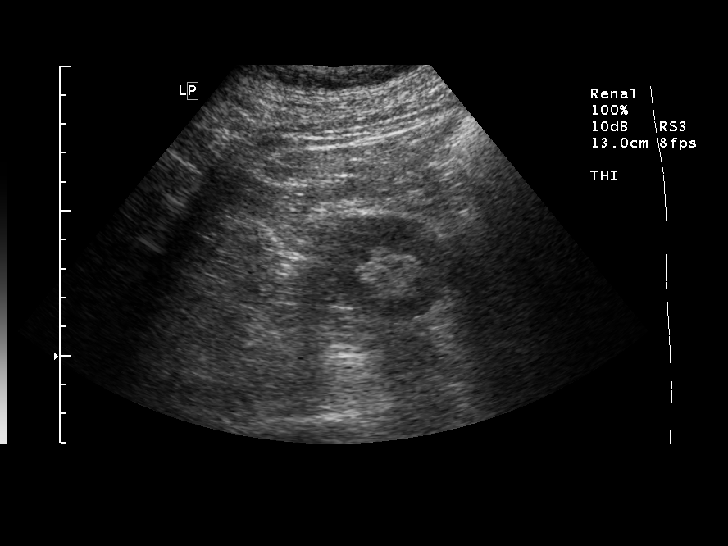
[im 10/23]
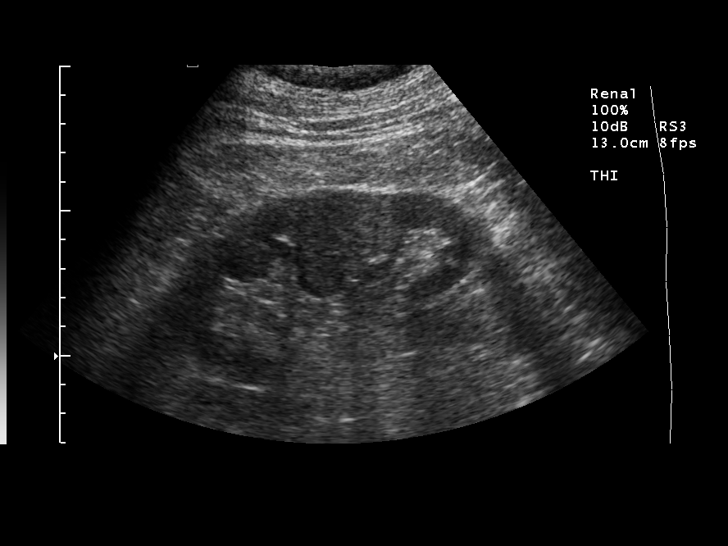
[im 11/23]
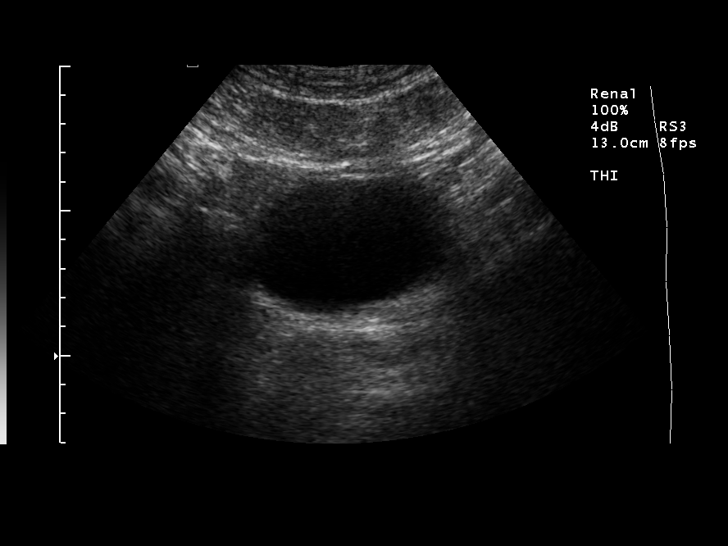
[im 13/23]
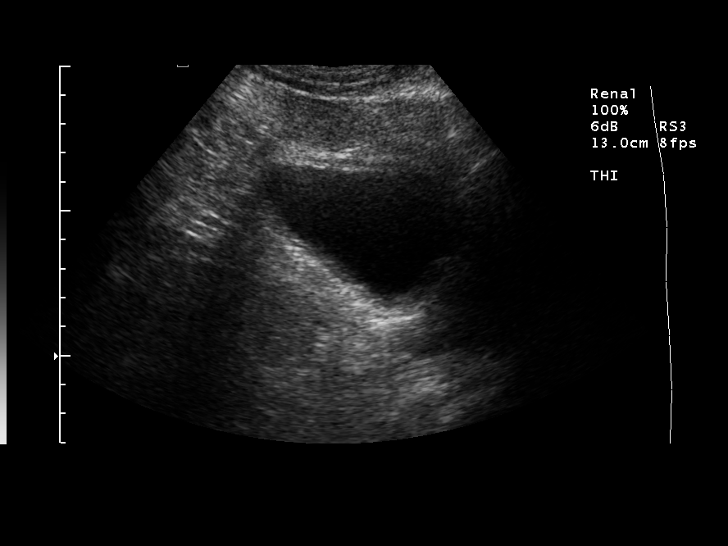
[im 14/23]
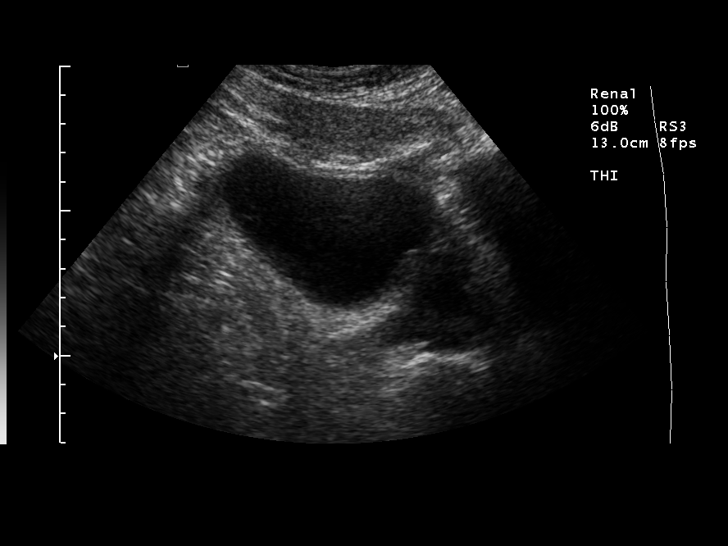
[im 16/23]
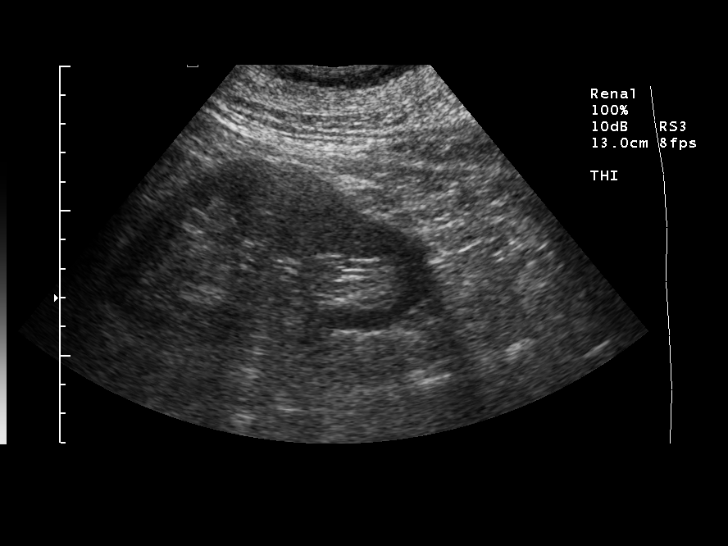
[im 18/23]
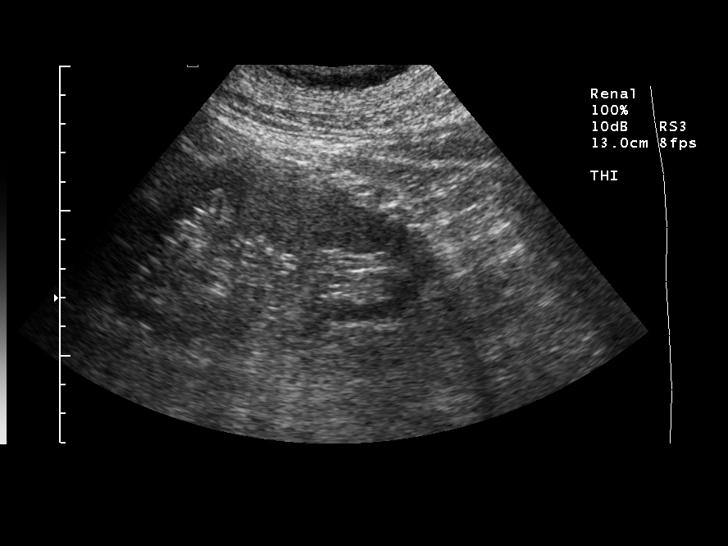
[im 19/23]
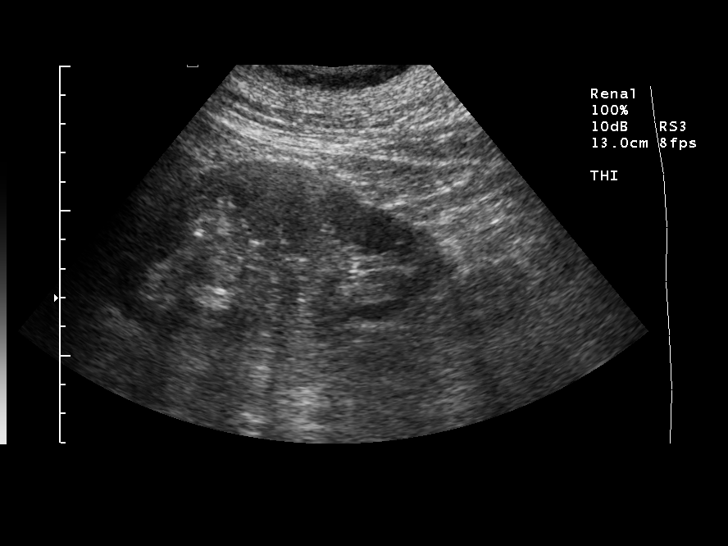
[im 21/23]
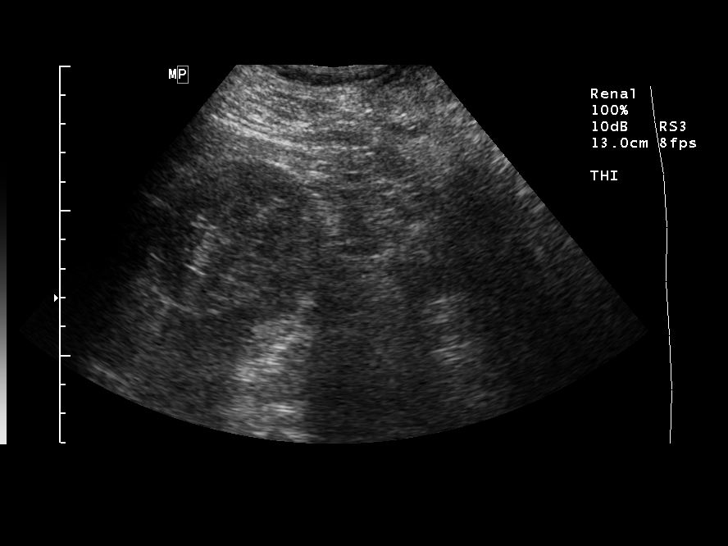
[im 23/23]
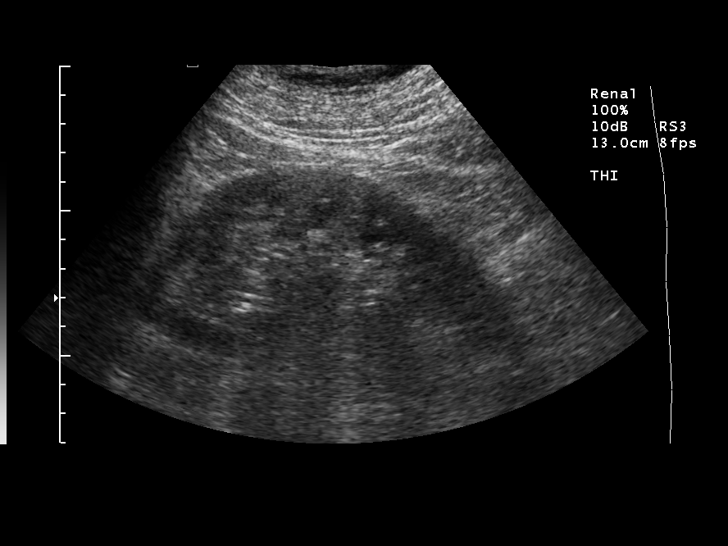

[14 of 23 positions shown; findings below may reference images not displayed]

FINDINGS: Right and left kidneys measure 11.3 cm and 11.7 cm in length, respectively.  No hydronephrosis.  I believe the renal parenchymal echogenicity is slightly increased.  Unremarkable appearance of urinary bladder.
IMPRESSION: No hydronephrosis.  Normal sized kidneys.  Suggestion of a slight increase in renal parenchymal echogenicity, which would be compatible nonspecific renal medical disease.

## 2007-02-05 ENCOUNTER — Encounter (INDEPENDENT_AMBULATORY_CARE_PROVIDER_SITE_OTHER): Payer: Self-pay | Admitting: Internal Medicine

## 2007-02-06 ENCOUNTER — Telehealth (INDEPENDENT_AMBULATORY_CARE_PROVIDER_SITE_OTHER): Payer: Self-pay | Admitting: *Deleted

## 2007-02-17 ENCOUNTER — Encounter (INDEPENDENT_AMBULATORY_CARE_PROVIDER_SITE_OTHER): Payer: Self-pay | Admitting: Internal Medicine

## 2007-02-26 ENCOUNTER — Telehealth (INDEPENDENT_AMBULATORY_CARE_PROVIDER_SITE_OTHER): Payer: Self-pay | Admitting: Internal Medicine

## 2007-03-02 ENCOUNTER — Telehealth (INDEPENDENT_AMBULATORY_CARE_PROVIDER_SITE_OTHER): Payer: Self-pay | Admitting: Internal Medicine

## 2007-03-05 ENCOUNTER — Ambulatory Visit: Payer: Self-pay | Admitting: Internal Medicine

## 2007-03-05 ENCOUNTER — Observation Stay (HOSPITAL_COMMUNITY): Admission: EM | Admit: 2007-03-05 | Discharge: 2007-03-06 | Payer: Self-pay | Admitting: Emergency Medicine

## 2007-03-23 ENCOUNTER — Ambulatory Visit: Payer: Self-pay | Admitting: Hospitalist

## 2007-03-23 ENCOUNTER — Encounter (INDEPENDENT_AMBULATORY_CARE_PROVIDER_SITE_OTHER): Payer: Self-pay | Admitting: *Deleted

## 2007-03-23 DIAGNOSIS — M25552 Pain in left hip: Secondary | ICD-10-CM

## 2007-03-23 DIAGNOSIS — R609 Edema, unspecified: Secondary | ICD-10-CM | POA: Insufficient documentation

## 2007-03-23 DIAGNOSIS — M25551 Pain in right hip: Secondary | ICD-10-CM

## 2007-03-25 ENCOUNTER — Encounter (INDEPENDENT_AMBULATORY_CARE_PROVIDER_SITE_OTHER): Payer: Self-pay | Admitting: Internal Medicine

## 2007-04-22 ENCOUNTER — Encounter (INDEPENDENT_AMBULATORY_CARE_PROVIDER_SITE_OTHER): Payer: Self-pay | Admitting: Internal Medicine

## 2007-04-22 ENCOUNTER — Ambulatory Visit: Payer: Self-pay | Admitting: Internal Medicine

## 2007-04-22 ENCOUNTER — Ambulatory Visit (HOSPITAL_COMMUNITY): Admission: RE | Admit: 2007-04-22 | Discharge: 2007-04-22 | Payer: Self-pay | Admitting: Internal Medicine

## 2007-04-22 DIAGNOSIS — I480 Paroxysmal atrial fibrillation: Secondary | ICD-10-CM

## 2007-04-23 ENCOUNTER — Ambulatory Visit: Payer: Self-pay | Admitting: *Deleted

## 2007-04-23 ENCOUNTER — Inpatient Hospital Stay (HOSPITAL_COMMUNITY): Admission: EM | Admit: 2007-04-23 | Discharge: 2007-04-29 | Payer: Self-pay | Admitting: Emergency Medicine

## 2007-04-23 ENCOUNTER — Encounter (INDEPENDENT_AMBULATORY_CARE_PROVIDER_SITE_OTHER): Payer: Self-pay | Admitting: Internal Medicine

## 2007-04-23 ENCOUNTER — Ambulatory Visit: Payer: Self-pay | Admitting: Internal Medicine

## 2007-04-23 LAB — CONVERTED CEMR LAB
ALT: 20 units/L (ref 0–53)
AST: 20 units/L (ref 0–37)
BUN: 37 mg/dL — ABNORMAL HIGH (ref 6–23)
Calcium: 9.3 mg/dL (ref 8.4–10.5)
Creatinine, Ser: 1.41 mg/dL (ref 0.40–1.50)
Digitoxin Lvl: 0.2 ng/mL — ABNORMAL LOW (ref 0.8–2.0)
Glucose, Bld: 91 mg/dL (ref 70–99)
HCT: 47.8 % (ref 39.0–52.0)
MCV: 94 fL (ref 78.0–100.0)
Platelets: 169 10*3/uL (ref 150–400)
RDW: 15.2 % (ref 11.5–15.5)
Sodium: 143 meq/L (ref 135–145)
Sodium: 143 meq/L (ref 135–145)
Total Bilirubin: 1 mg/dL (ref 0.3–1.2)
Total CK: 61 units/L (ref 7–232)

## 2007-05-08 ENCOUNTER — Encounter (INDEPENDENT_AMBULATORY_CARE_PROVIDER_SITE_OTHER): Payer: Self-pay | Admitting: Infectious Diseases

## 2007-05-08 ENCOUNTER — Ambulatory Visit: Payer: Self-pay | Admitting: Internal Medicine

## 2007-05-08 LAB — CONVERTED CEMR LAB
CO2: 25 meq/L (ref 19–32)
Chloride: 110 meq/L (ref 96–112)
Glucose, Bld: 59 mg/dL — ABNORMAL LOW (ref 70–99)
Potassium: 5.2 meq/L (ref 3.5–5.3)
Sodium: 145 meq/L (ref 135–145)

## 2007-05-25 ENCOUNTER — Encounter (INDEPENDENT_AMBULATORY_CARE_PROVIDER_SITE_OTHER): Payer: Self-pay | Admitting: Internal Medicine

## 2007-06-01 ENCOUNTER — Inpatient Hospital Stay (HOSPITAL_COMMUNITY): Admission: EM | Admit: 2007-06-01 | Discharge: 2007-06-03 | Payer: Self-pay | Admitting: Emergency Medicine

## 2007-06-03 ENCOUNTER — Encounter: Payer: Self-pay | Admitting: Licensed Clinical Social Worker

## 2007-06-03 ENCOUNTER — Telehealth (INDEPENDENT_AMBULATORY_CARE_PROVIDER_SITE_OTHER): Payer: Self-pay | Admitting: Internal Medicine

## 2007-06-04 ENCOUNTER — Encounter (INDEPENDENT_AMBULATORY_CARE_PROVIDER_SITE_OTHER): Payer: Self-pay | Admitting: Internal Medicine

## 2007-06-19 ENCOUNTER — Inpatient Hospital Stay (HOSPITAL_COMMUNITY): Admission: EM | Admit: 2007-06-19 | Discharge: 2007-06-19 | Payer: Self-pay | Admitting: Emergency Medicine

## 2007-06-22 ENCOUNTER — Ambulatory Visit: Payer: Self-pay | Admitting: Internal Medicine

## 2007-07-06 ENCOUNTER — Encounter (INDEPENDENT_AMBULATORY_CARE_PROVIDER_SITE_OTHER): Payer: Self-pay | Admitting: Internal Medicine

## 2007-07-06 ENCOUNTER — Telehealth (INDEPENDENT_AMBULATORY_CARE_PROVIDER_SITE_OTHER): Payer: Self-pay | Admitting: Internal Medicine

## 2007-07-14 ENCOUNTER — Encounter (INDEPENDENT_AMBULATORY_CARE_PROVIDER_SITE_OTHER): Payer: Self-pay | Admitting: Internal Medicine

## 2007-07-24 ENCOUNTER — Ambulatory Visit: Payer: Self-pay | Admitting: Infectious Diseases

## 2007-07-24 ENCOUNTER — Encounter (INDEPENDENT_AMBULATORY_CARE_PROVIDER_SITE_OTHER): Payer: Self-pay | Admitting: Internal Medicine

## 2007-07-25 LAB — CONVERTED CEMR LAB
BUN: 28 mg/dL — ABNORMAL HIGH (ref 6–23)
Chloride: 109 meq/L (ref 96–112)
Creatinine, Ser: 1.48 mg/dL (ref 0.40–1.50)
Eosinophils Absolute: 0.3 10*3/uL (ref 0.0–0.7)
Eosinophils Relative: 2 % (ref 0–5)
HCT: 44.4 % (ref 39.0–52.0)
Lymphs Abs: 4.3 10*3/uL — ABNORMAL HIGH (ref 0.7–4.0)
MCHC: 34.5 g/dL (ref 30.0–36.0)
MCV: 97.8 fL (ref 78.0–100.0)
Platelets: 210 10*3/uL (ref 150–400)
Prothrombin Time: 35.1 s — ABNORMAL HIGH (ref 11.6–15.2)
RDW: 15.5 % (ref 11.5–15.5)
WBC: 12.6 10*3/uL — ABNORMAL HIGH (ref 4.0–10.5)

## 2007-08-25 ENCOUNTER — Telehealth: Payer: Self-pay | Admitting: Infectious Disease

## 2007-08-25 IMAGING — CR DG LUMBAR SPINE COMPLETE 4+V
4 series · 4 of 4 positions shown · non-contrast
Comparison: none

CLINICAL DATA: Chronic low back pain.  
LUMBAR SPINE - 4 VIEW:

[view not recorded (1 of 4)]
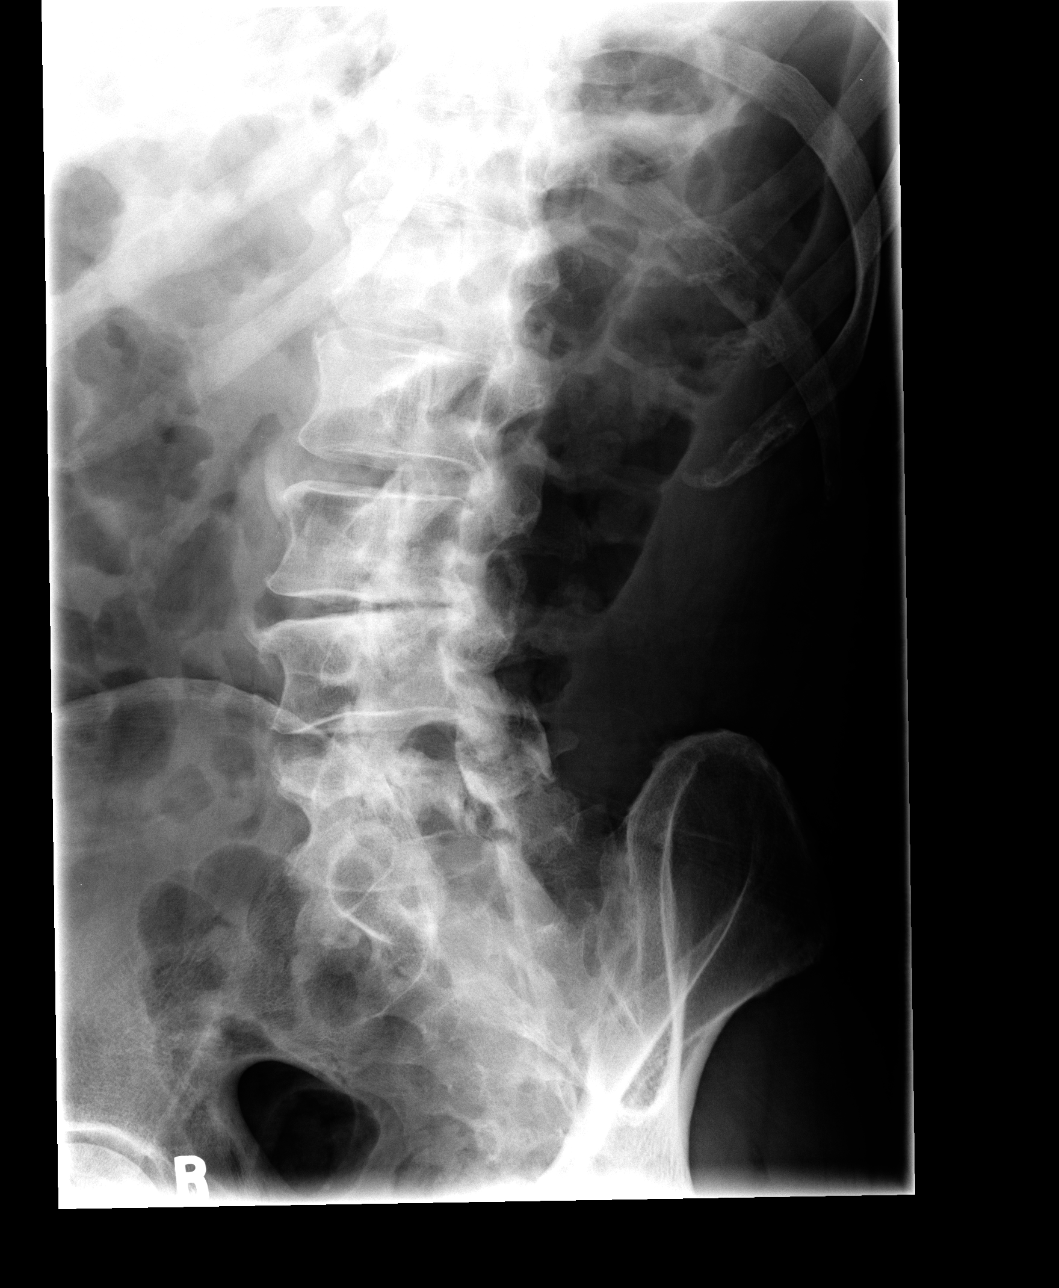

[view not recorded (2 of 4)]
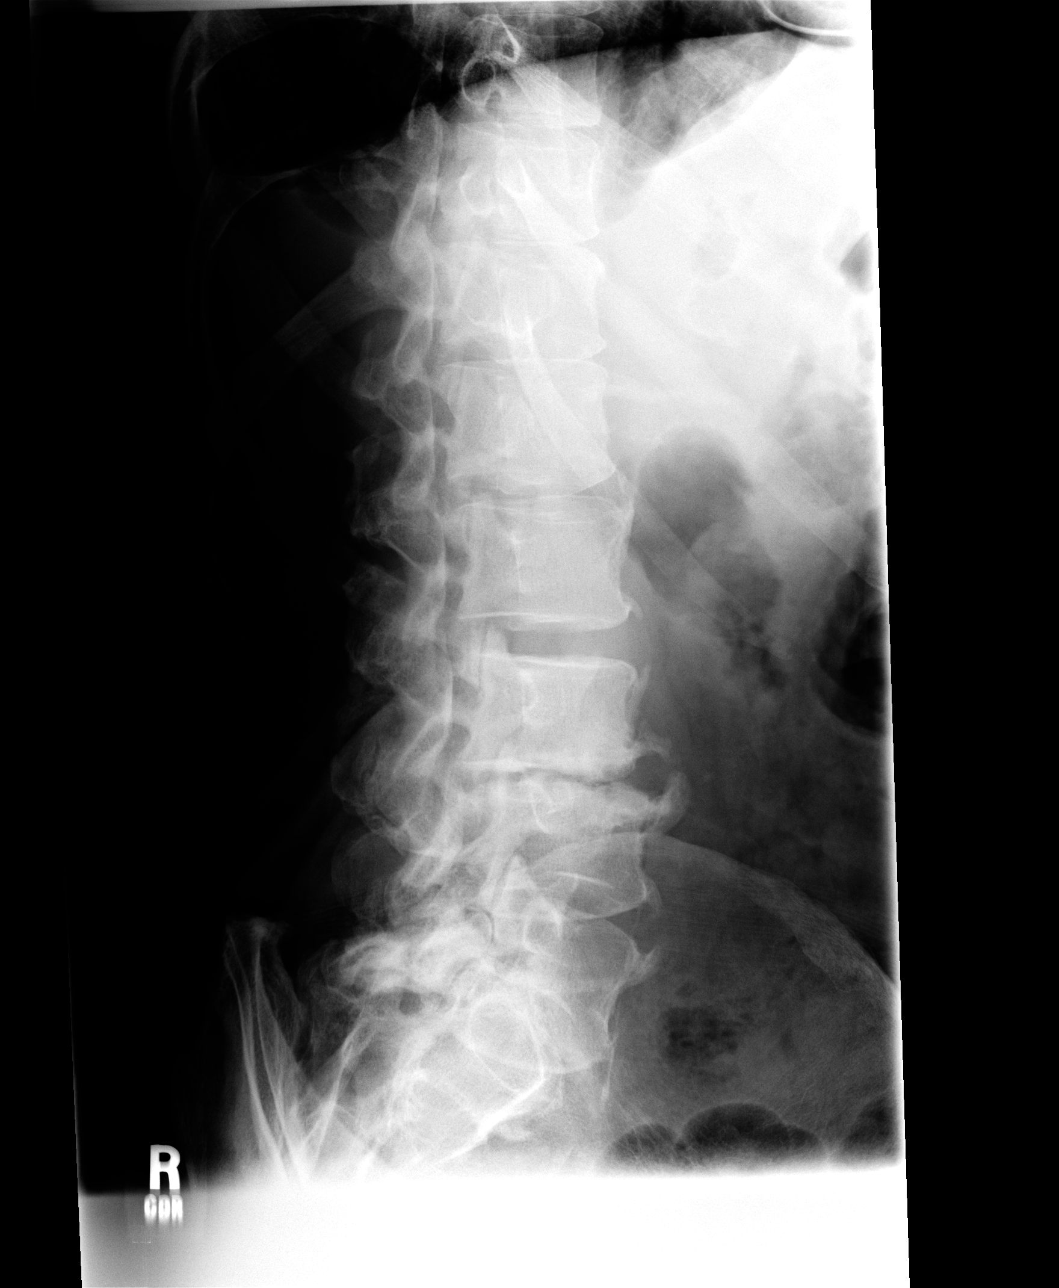

[view not recorded (3 of 4)]
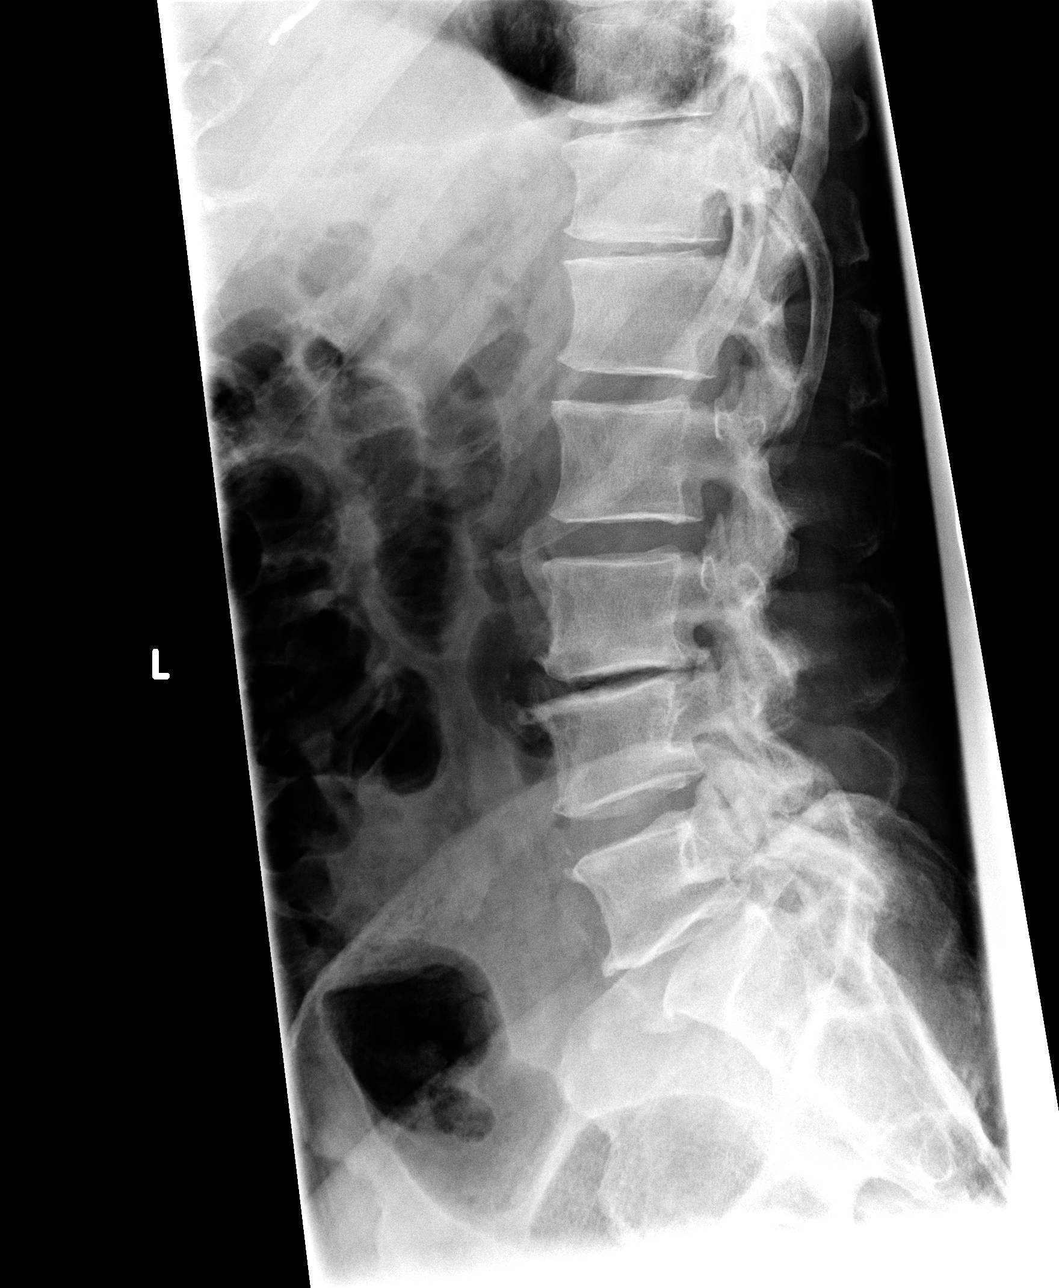

[view not recorded (4 of 4)]
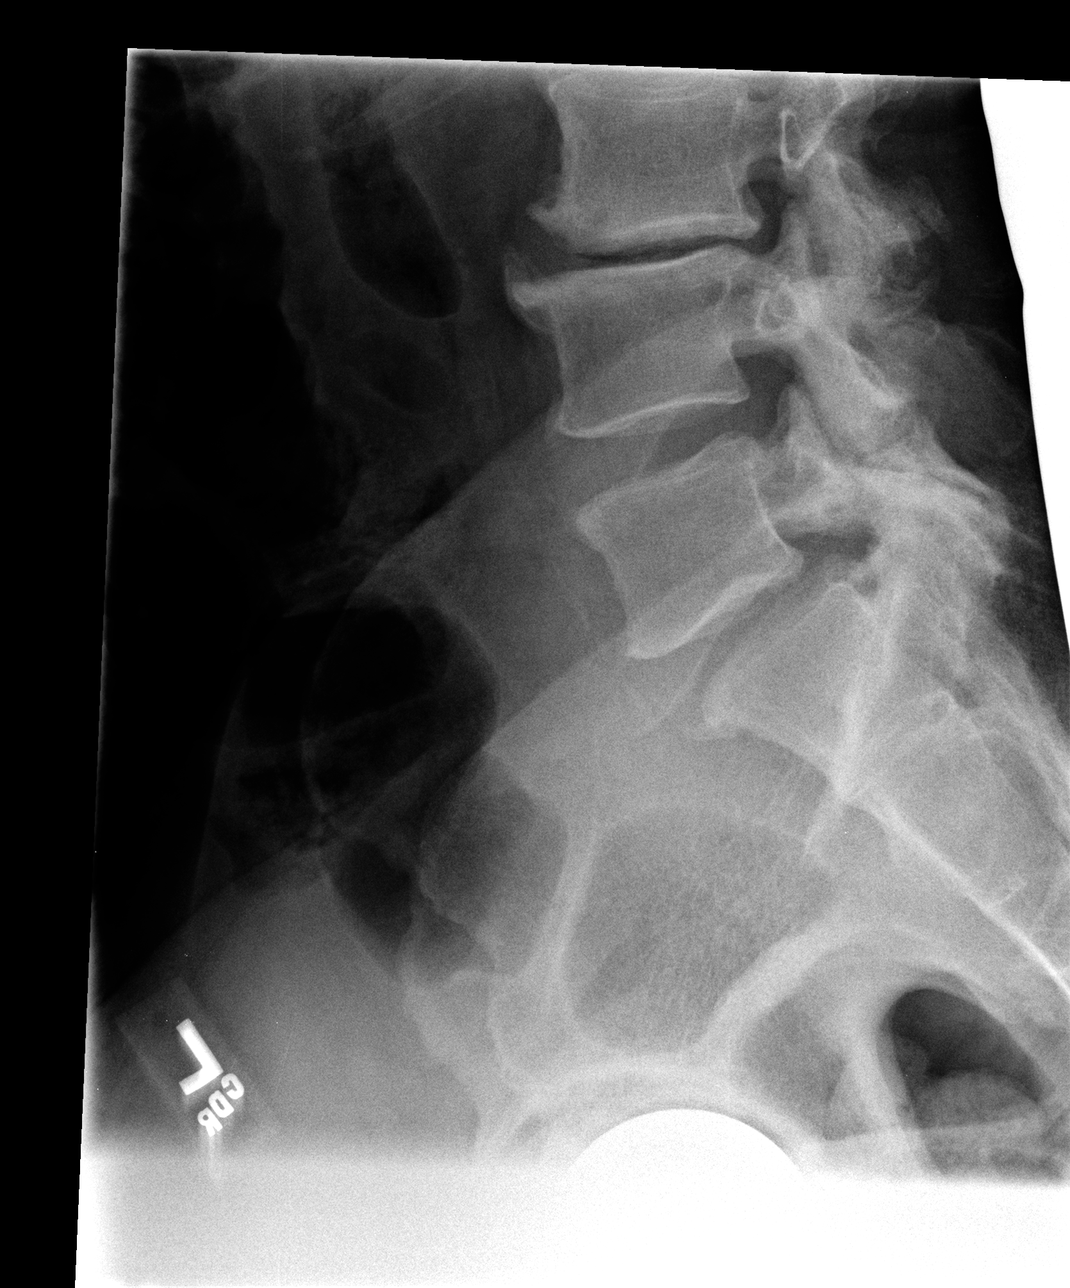

[4 of 4 positions shown; findings below may reference images not displayed]

FINDINGS: There is very mild rightward curvature.  Question slight retrolisthesis of L3 on L4 despite patient rotation.  There is definite loss of disc space height, vacuum disc phenomenon and endplate sclerosis with osteophytosis at this level.  Endplate degenerative changes are seen throughout the lumbar spine.  Disc space height is minimally decreased at L5-S1.
IMPRESSION: Spondylosis, worst at L3-4, as above.

## 2007-09-07 ENCOUNTER — Encounter (INDEPENDENT_AMBULATORY_CARE_PROVIDER_SITE_OTHER): Payer: Self-pay | Admitting: Internal Medicine

## 2007-09-14 ENCOUNTER — Encounter (INDEPENDENT_AMBULATORY_CARE_PROVIDER_SITE_OTHER): Payer: Self-pay | Admitting: Internal Medicine

## 2007-09-29 ENCOUNTER — Emergency Department (HOSPITAL_COMMUNITY): Admission: EM | Admit: 2007-09-29 | Discharge: 2007-09-30 | Payer: Self-pay | Admitting: Emergency Medicine

## 2007-09-29 ENCOUNTER — Telehealth (INDEPENDENT_AMBULATORY_CARE_PROVIDER_SITE_OTHER): Payer: Self-pay | Admitting: Internal Medicine

## 2007-09-30 ENCOUNTER — Encounter (INDEPENDENT_AMBULATORY_CARE_PROVIDER_SITE_OTHER): Payer: Self-pay | Admitting: Internal Medicine

## 2007-10-01 ENCOUNTER — Telehealth (INDEPENDENT_AMBULATORY_CARE_PROVIDER_SITE_OTHER): Payer: Self-pay | Admitting: *Deleted

## 2007-10-02 ENCOUNTER — Ambulatory Visit: Payer: Self-pay | Admitting: Infectious Disease

## 2007-10-07 ENCOUNTER — Encounter (INDEPENDENT_AMBULATORY_CARE_PROVIDER_SITE_OTHER): Payer: Self-pay | Admitting: Internal Medicine

## 2007-10-29 ENCOUNTER — Telehealth (INDEPENDENT_AMBULATORY_CARE_PROVIDER_SITE_OTHER): Payer: Self-pay | Admitting: Internal Medicine

## 2007-11-06 ENCOUNTER — Encounter (INDEPENDENT_AMBULATORY_CARE_PROVIDER_SITE_OTHER): Payer: Self-pay | Admitting: Internal Medicine

## 2007-11-06 ENCOUNTER — Ambulatory Visit: Payer: Self-pay | Admitting: Internal Medicine

## 2007-11-09 LAB — CONVERTED CEMR LAB
ALT: 15 units/L (ref 0–53)
AST: 21 units/L (ref 0–37)
Alkaline Phosphatase: 43 units/L (ref 39–117)
Basophils Absolute: 0 10*3/uL (ref 0.0–0.1)
Basophils Relative: 0 % (ref 0–1)
INR: 2.4 — ABNORMAL HIGH (ref 0.0–1.5)
LDL Cholesterol: 61 mg/dL (ref 0–99)
MCHC: 33.9 g/dL (ref 30.0–36.0)
Monocytes Relative: 8 % (ref 3–12)
Neutro Abs: 6.7 10*3/uL (ref 1.7–7.7)
Neutrophils Relative %: 62 % (ref 43–77)
Prothrombin Time: 26.5 s — ABNORMAL HIGH (ref 11.6–15.2)
RBC: 4.42 M/uL (ref 4.22–5.81)
RDW: 13.6 % (ref 11.5–15.5)
Sodium: 142 meq/L (ref 135–145)
Total Bilirubin: 0.9 mg/dL (ref 0.3–1.2)
Total Protein: 6.4 g/dL (ref 6.0–8.3)
VLDL: 18 mg/dL (ref 0–40)

## 2007-11-20 IMAGING — CR DG CHEST 2V
2 series · 2 of 2 positions shown · non-contrast
Comparison: 10/23/2005

CLINICAL DATA: Weight loss. Cough. Pleurisy

CHEST - 2 VIEW

[w chest pa]
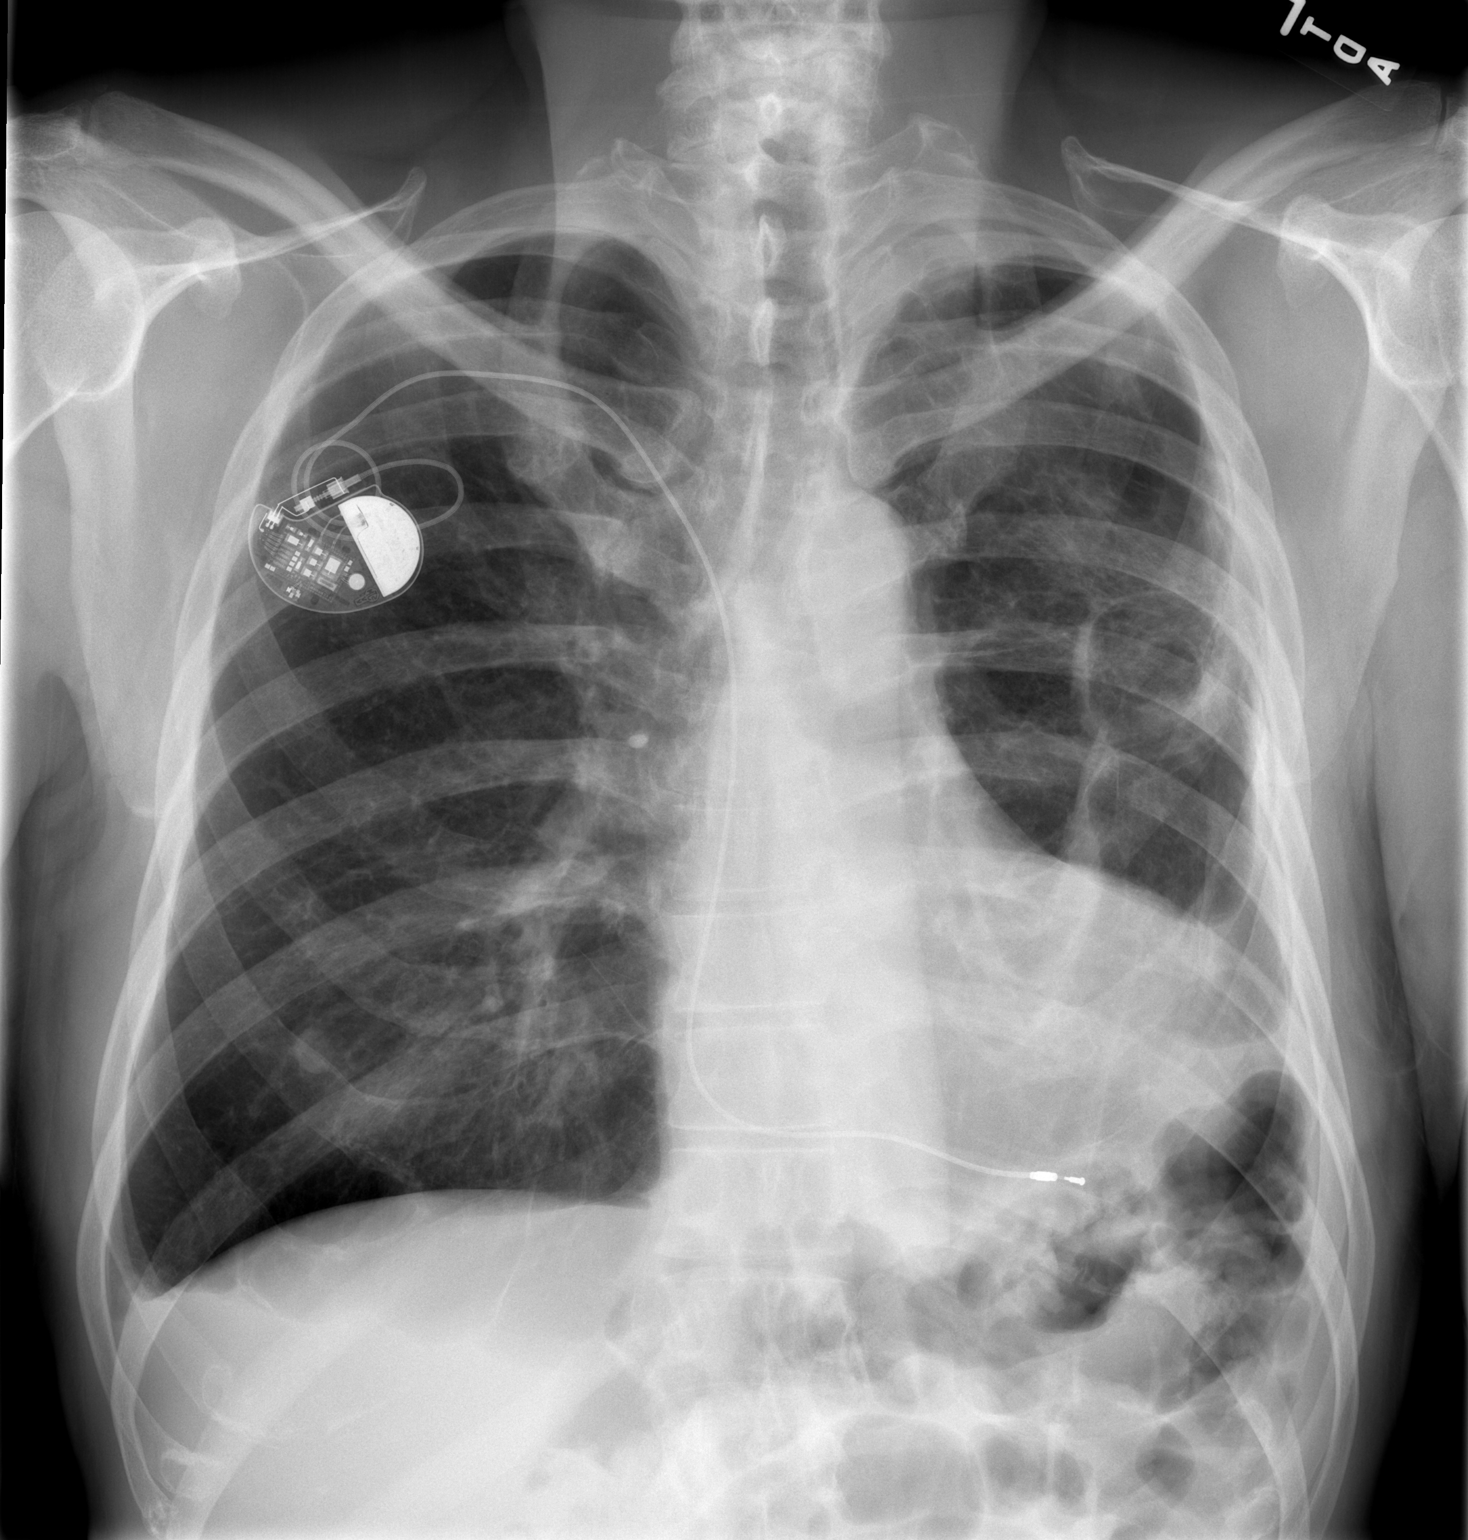

[w chest lat]
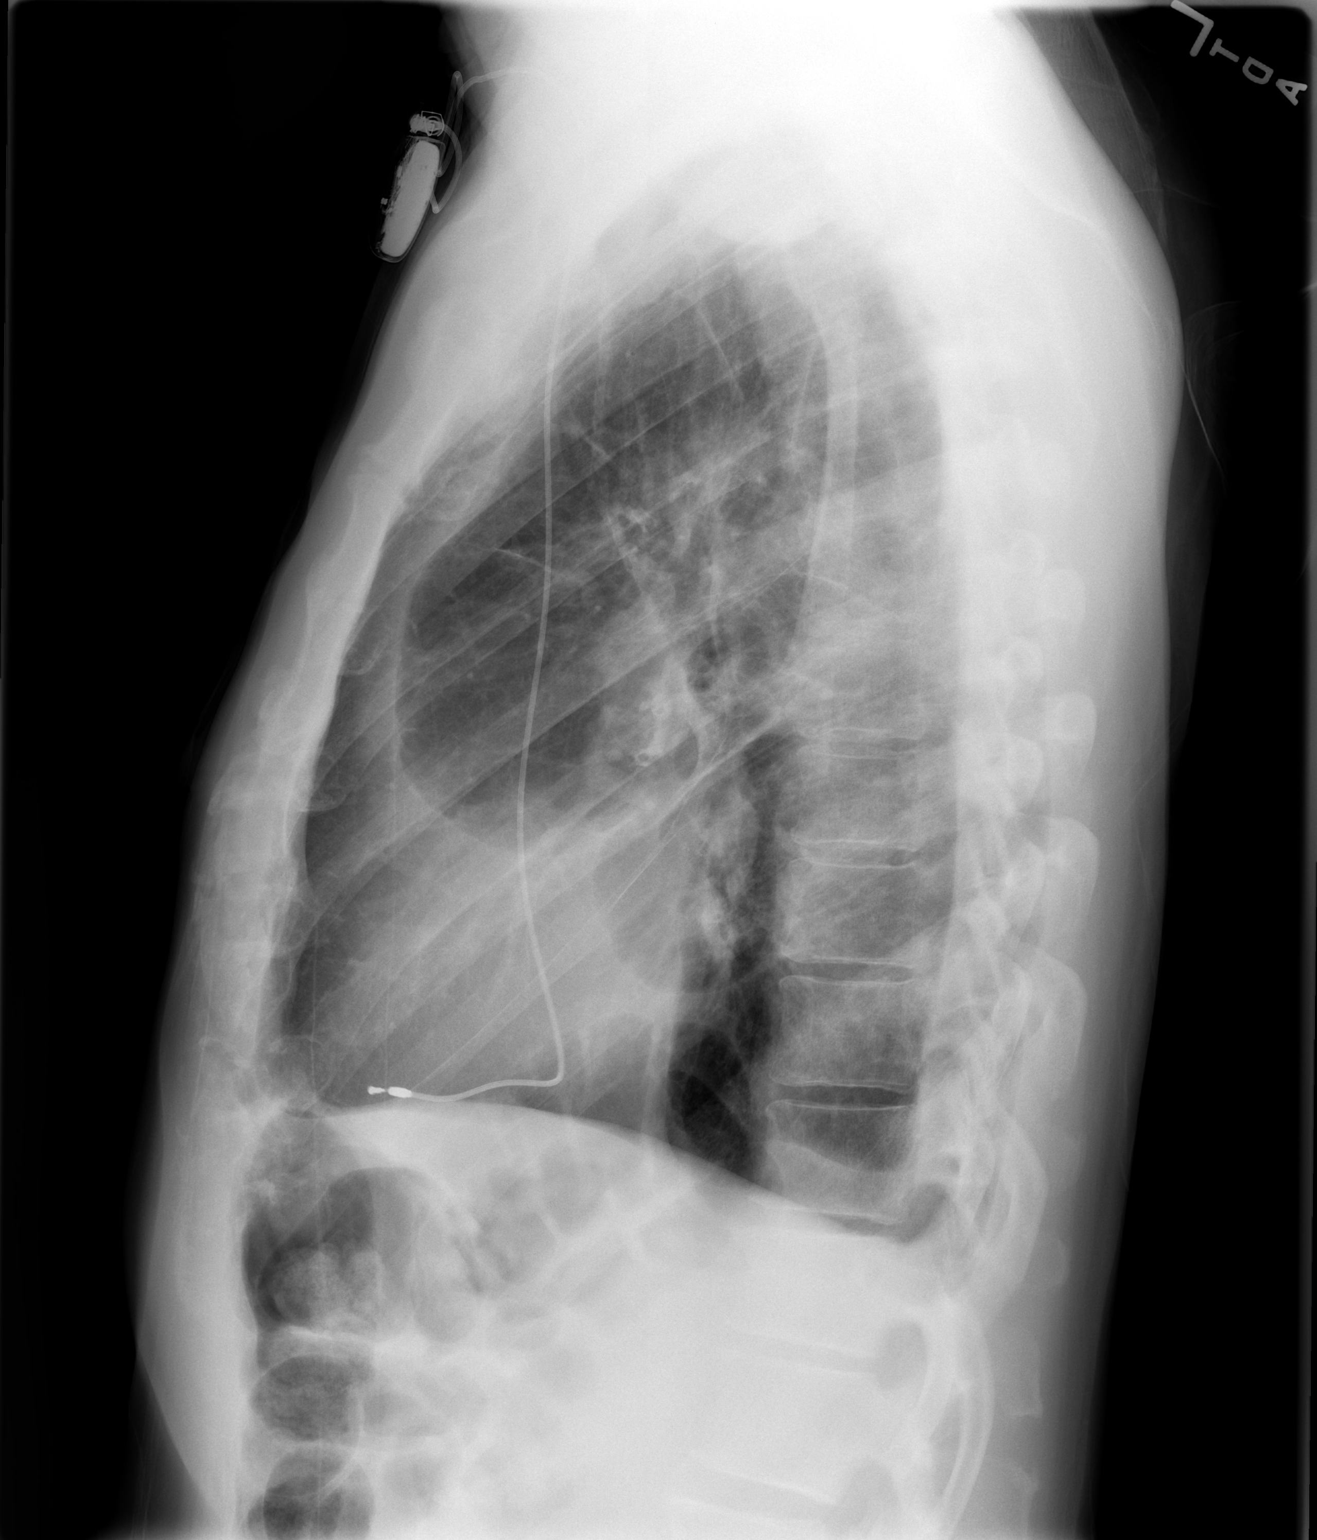

[2 of 2 positions shown; findings below may reference images not displayed]

FINDINGS: Right chest wall pacer is noted with lead in the right ventricle.

Cardiac enlargement.

Scarring and volume loss at the left base is similar to prior exam.

Severe COPD/emphysema with extensive scarring involving the left lung is similar
to prior exam.

Nodule in the right lower lobe likely reflects nipple artifact

IMPRESSION

1. Stable compared to prior exam.

2. Severe COPD/emphysema.

3. Scarring and volume loss involve left lung

## 2007-11-25 ENCOUNTER — Telehealth (INDEPENDENT_AMBULATORY_CARE_PROVIDER_SITE_OTHER): Payer: Self-pay | Admitting: Internal Medicine

## 2007-11-26 IMAGING — US US SCROTUM
1 series · 14 of 25 positions shown · non-contrast
Comparison: 01/23/2001

CLINICAL DATA: Testicular pain.

SCROTAL ULTRASOUND WITH DOPPLER ANALYSIS
TECHNIQUE: Complete ultrasound examination of the testicles, epididymis, and
other scrotal structures was performed. Doppler waveform analysis with color and
duplex imaging was also performed.

[Series 1: unknown · 0.13mm/px · 14 of 40 slices shown]
[im 1/40]
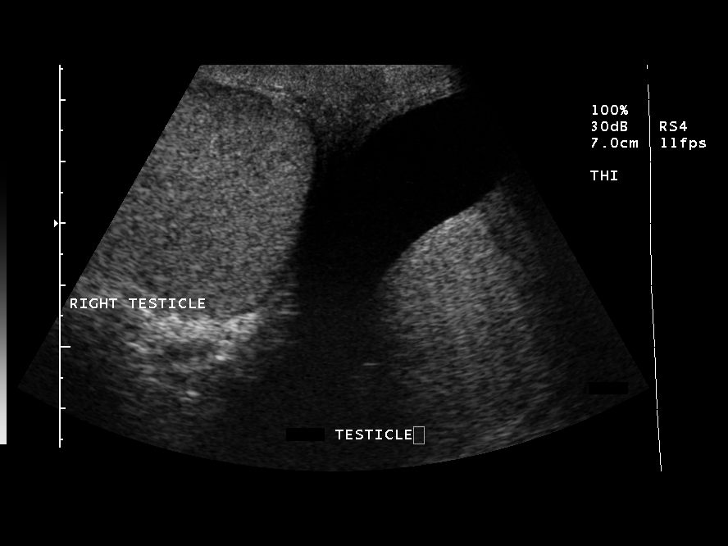
[im 4/40]
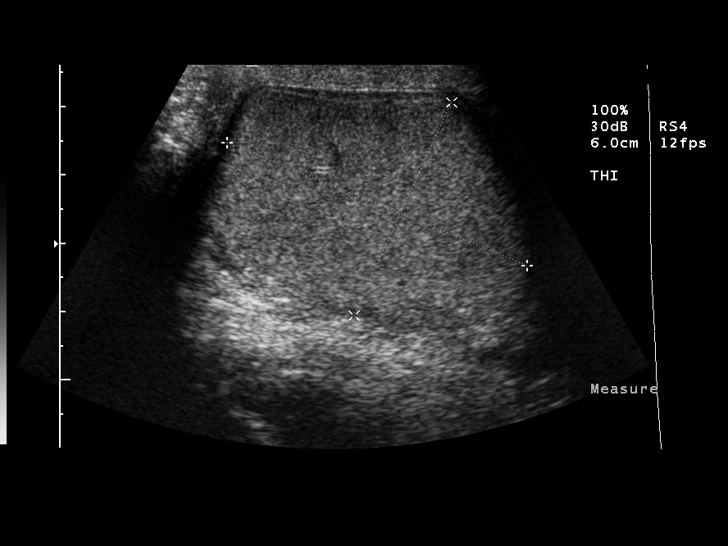
[im 7/40]
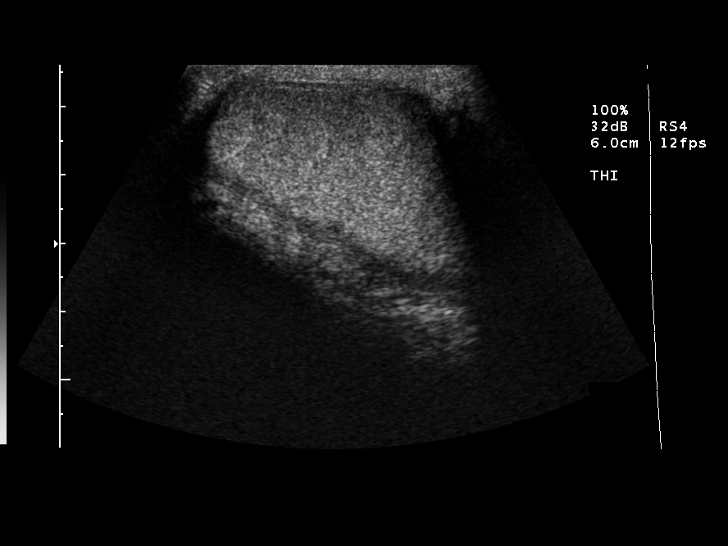
[im 10/40]
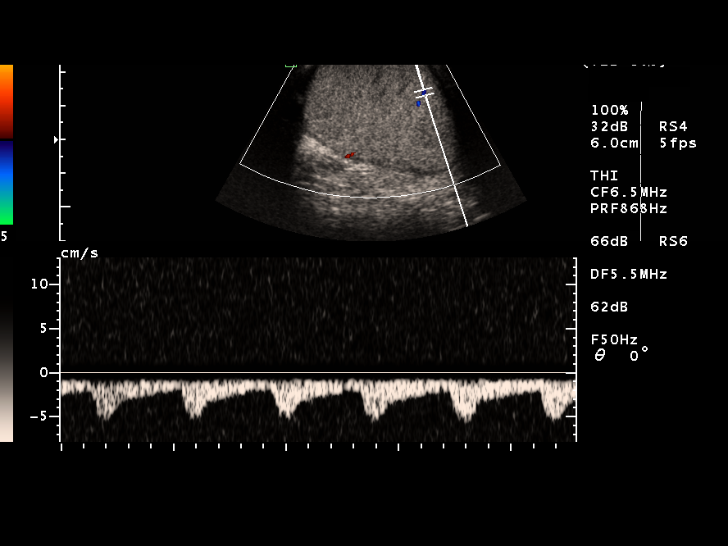
[im 14/40]
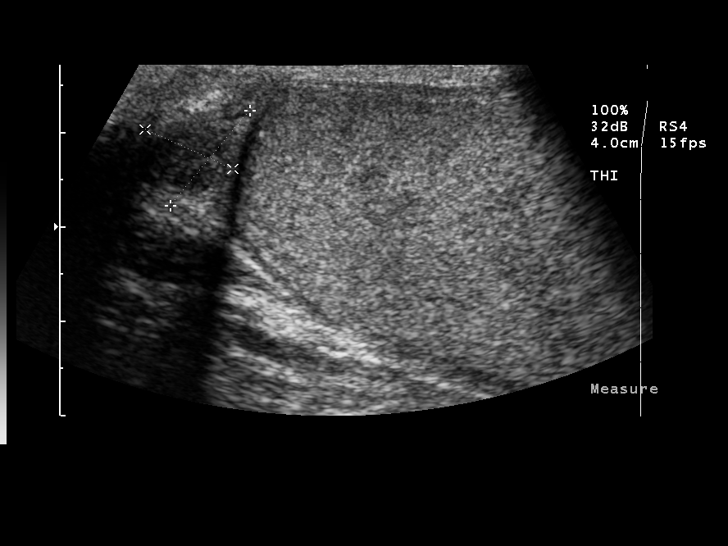
[im 15/40]
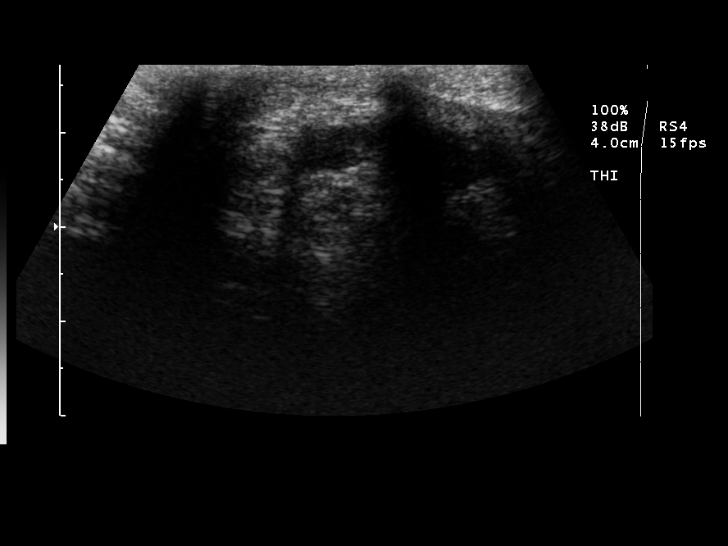
[im 18/40]
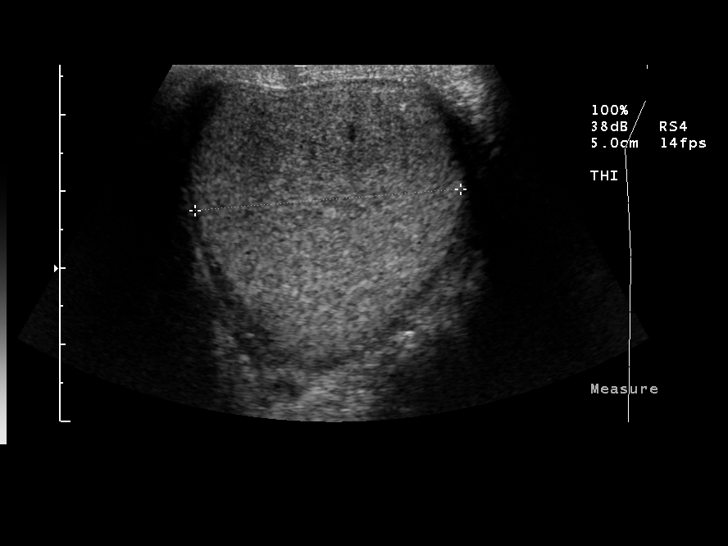
[im 22/40]
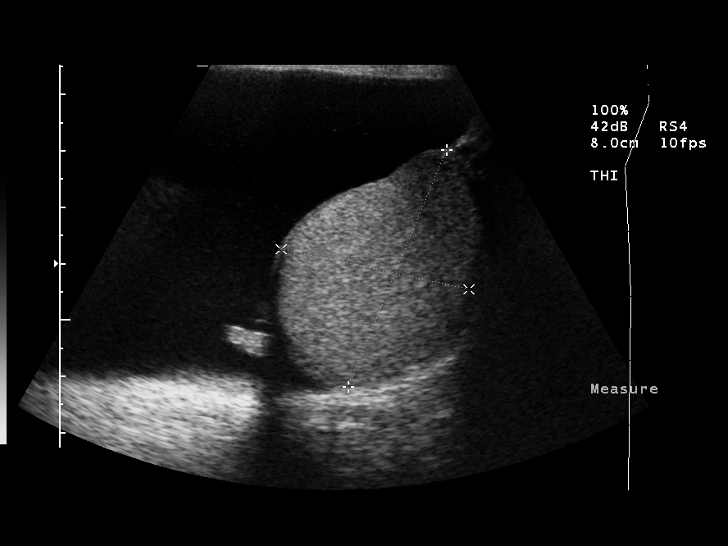
[im 25/40]
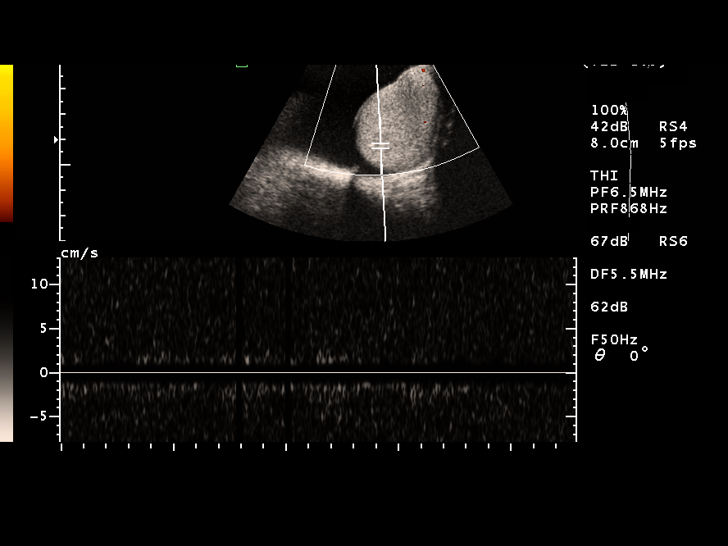
[im 27/40]
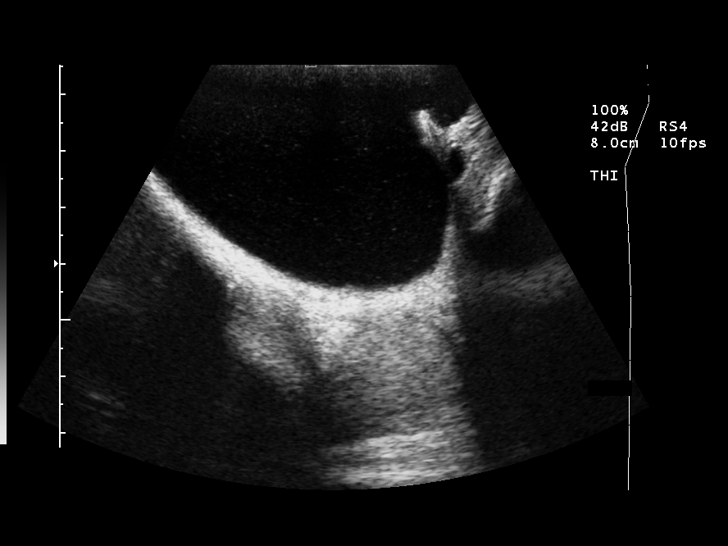
[im 30/40]
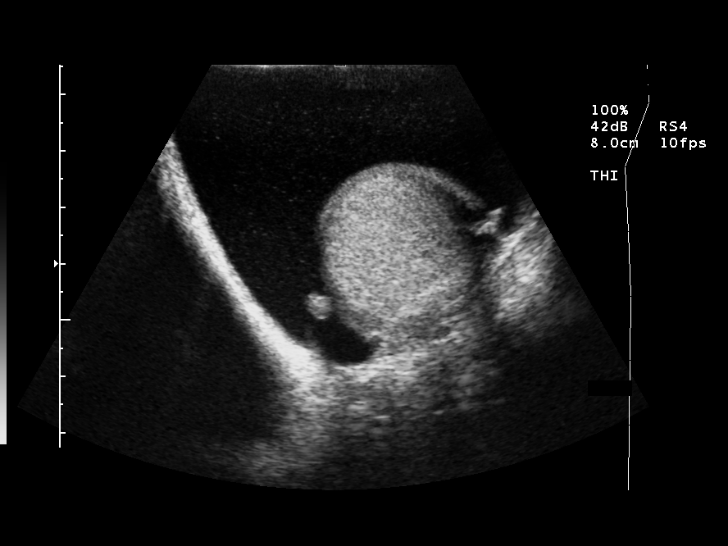
[im 33/40]
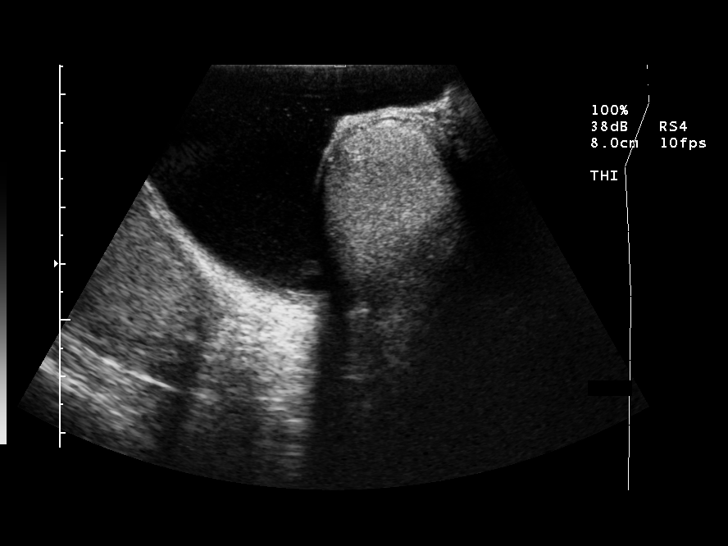
[im 36/40]
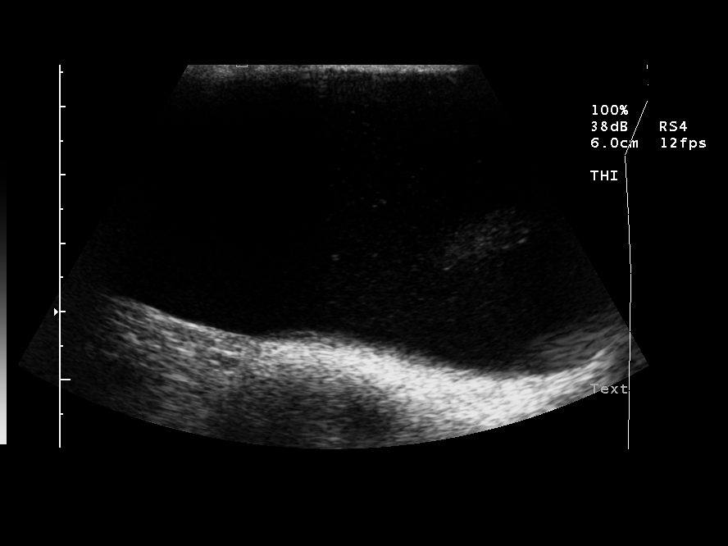
[im 40/40]
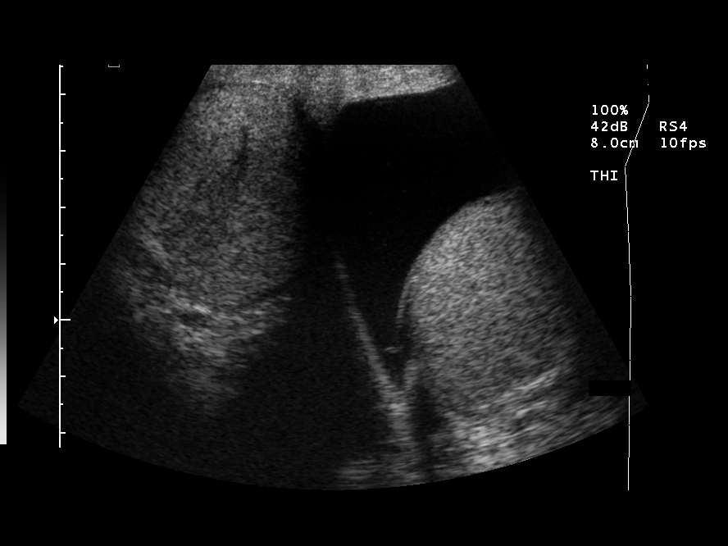

[14 of 25 positions shown; findings below may reference images not displayed]

FINDINGS: The right testicle measures 4.7 x 3.4 x 3.5 cm in the left testicle
measures 4.6 x 3.4 x 3.3 cm. No findings of testicular torsion or testicular
mass. No testicular fracture noted. We demonstrate normal color Doppler flow
which appear symmetric. Arterial and venous waveforms appear unremarkable in
both testicles.

Right and left epididymis appear normal. There is a prominent left hydrocele,
increased in size compared to the prior exam from 2772, and with faint internal
echoes. The no varicocele identified.

IMPRESSION

1. Prominent left hydrocele, increased in size from the prior exam from 2772. No
findings of orchitis, epididymitis, testicular acute injury, or torsion.

## 2007-11-27 IMAGING — RF DG FLUORO GUIDE NDL PLC/BX
1 series · 1 of 1 positions shown · non-contrast
Comparison: none

CLINICAL DATA: Pain with walking. Right hip pain.
RIGHT HIP ? 2 VIEW:

[Series 1: run · 1 of 1 slices shown]
[im 1/1]
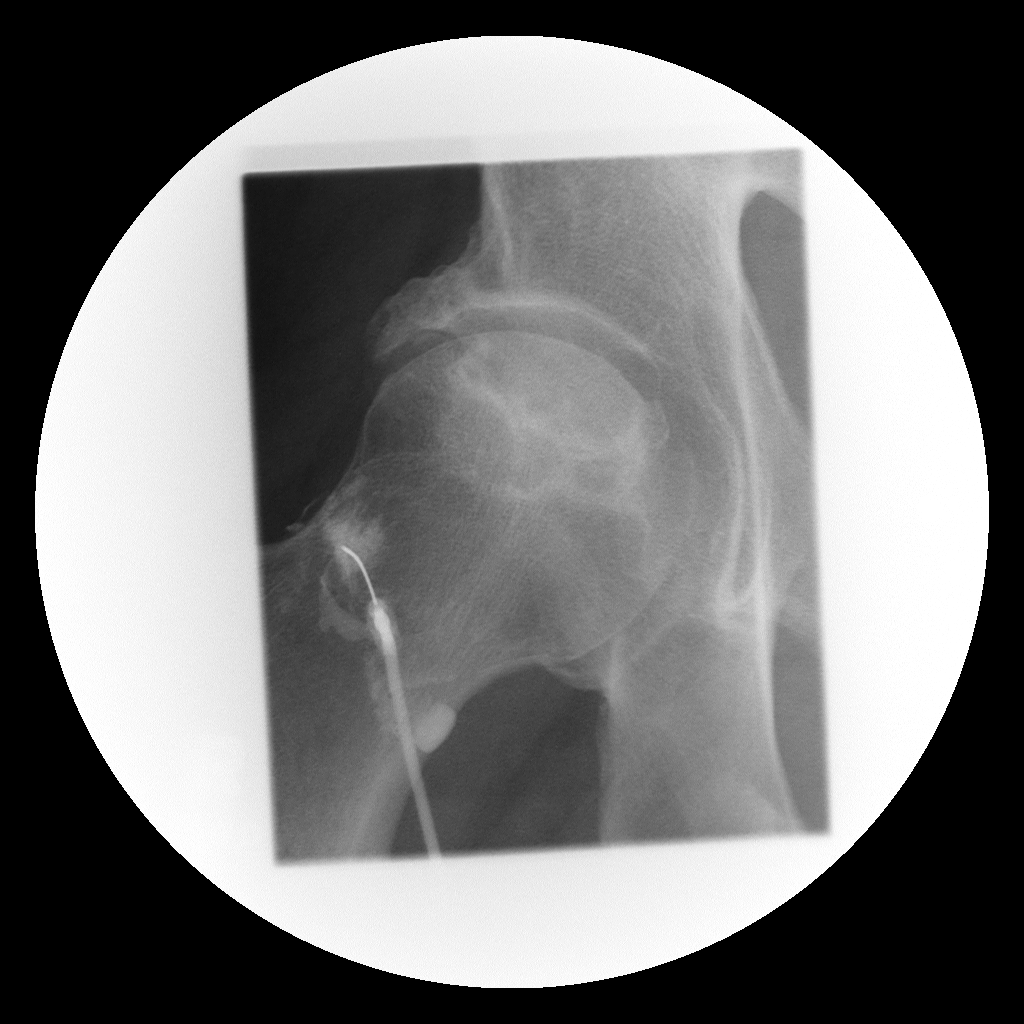

[1 of 1 positions shown; findings below may reference images not displayed]

FINDINGS: Previous left total hip replacement in good position. Changes of AVN in the right femoral head. Superimposed osteoarthritic change with marked spurring noted. Bony pelvis intact. Calcification noted in the upper scrotum.
IMPRESSION: Findings consistent with right hip DJD and AVN.
FLUOROSCOPIC-GUIDED NEEDLE PLACEMENT FOR STERILE INJECTION OF THE RIGHT HIP:
Following informed consent, sterile preparation of the groin and adequate local anesthesia, a 22-gauge spinal needle was placed over the right femoral neck. Contrast injection confirmed intraarticular spread. I injected 80 mg of Depo-Medrol along with 5 cc of a combination of 1% Lidocaine and 0.5% Sensorcaine.
Post procedure, the patient was comfortable.
IMPRESSION: Successful steroid injection right hip.

## 2007-11-27 IMAGING — CR DG HIP COMPLETE 2+V*R*
3 series · 3 of 3 positions shown · non-contrast
Comparison: none

CLINICAL DATA: Pain with walking. Right hip pain.
RIGHT HIP ? 2 VIEW:

[t pelvis a.p.]
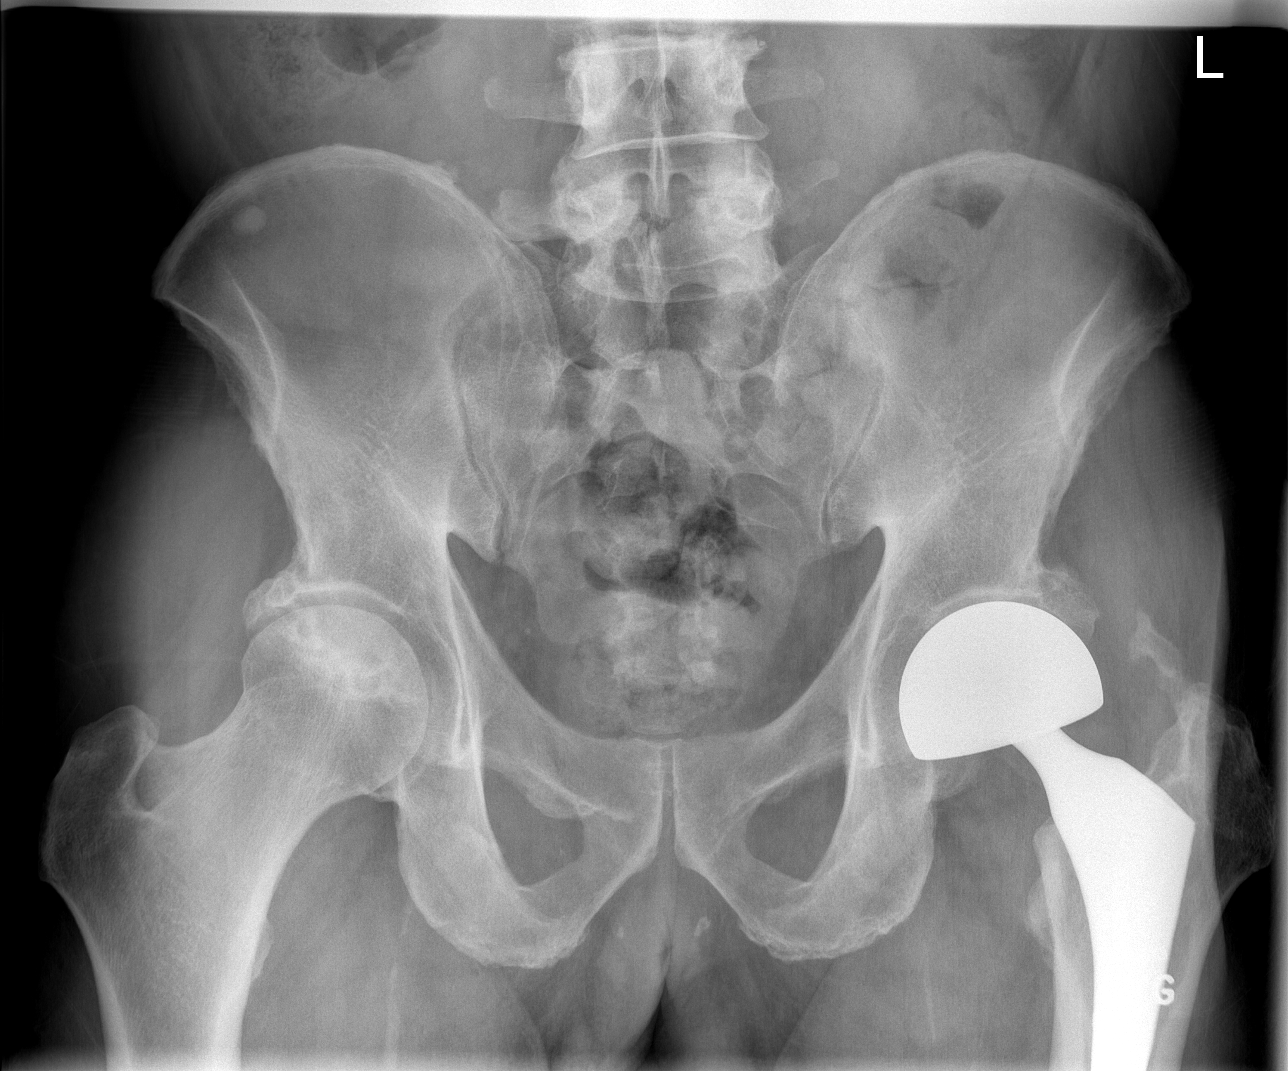

[t hip ap right]
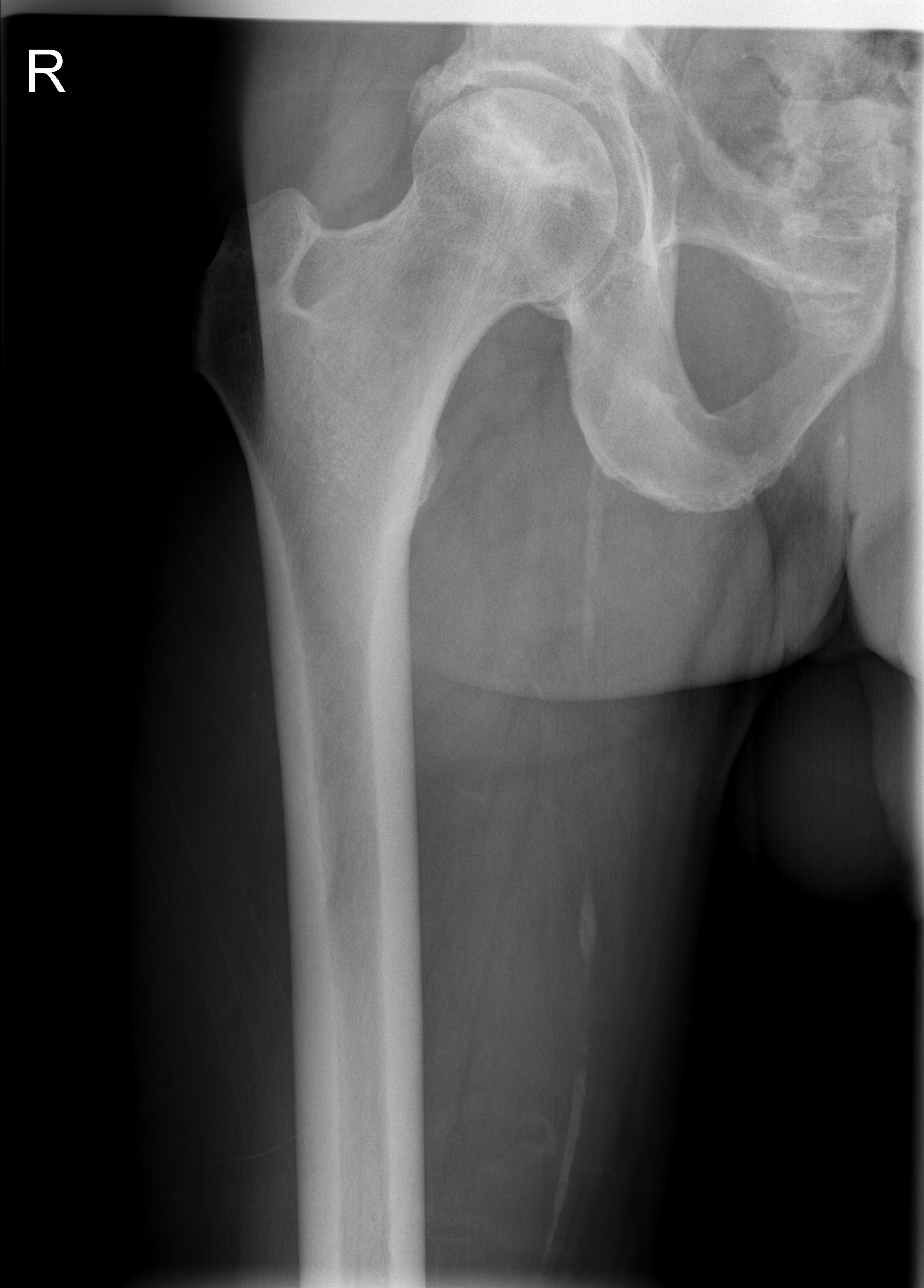

[t hip frog leg right]
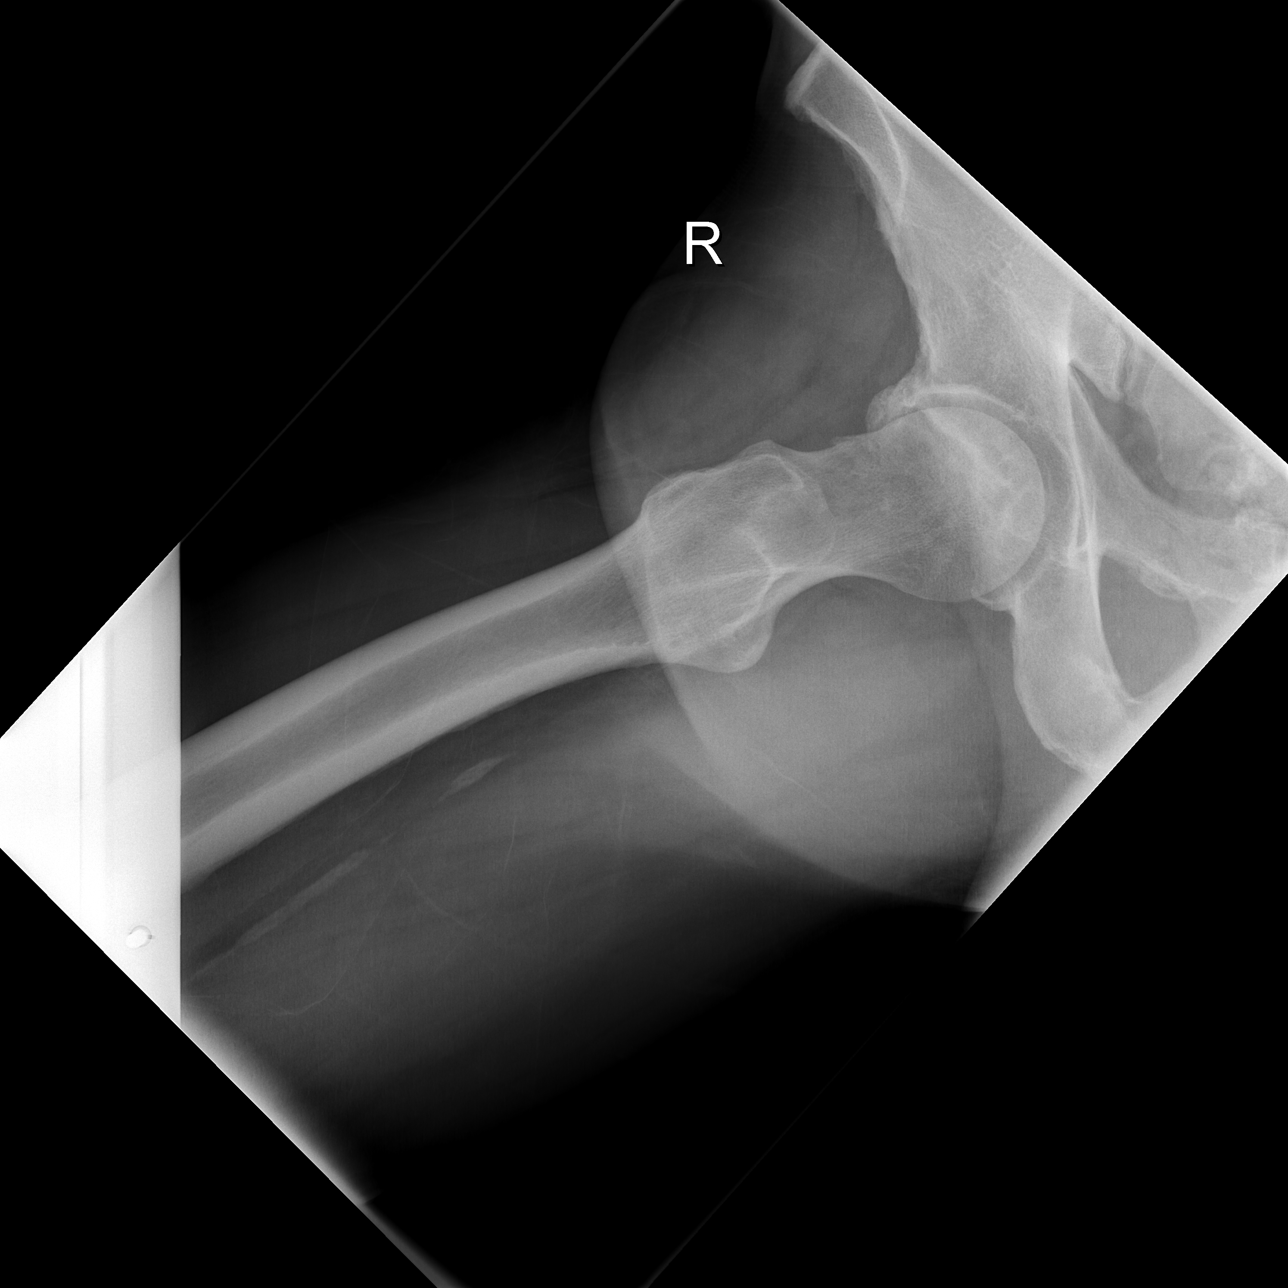

[3 of 3 positions shown; findings below may reference images not displayed]

FINDINGS: Previous left total hip replacement in good position. Changes of AVN in the right femoral head. Superimposed osteoarthritic change with marked spurring noted. Bony pelvis intact. Calcification noted in the upper scrotum.
IMPRESSION: Findings consistent with right hip DJD and AVN.
FLUOROSCOPIC-GUIDED NEEDLE PLACEMENT FOR STERILE INJECTION OF THE RIGHT HIP:
Following informed consent, sterile preparation of the groin and adequate local anesthesia, a 22-gauge spinal needle was placed over the right femoral neck. Contrast injection confirmed intraarticular spread. I injected 80 mg of Depo-Medrol along with 5 cc of a combination of 1% Lidocaine and 0.5% Sensorcaine.
Post procedure, the patient was comfortable.
IMPRESSION: Successful steroid injection right hip.

## 2007-11-30 ENCOUNTER — Encounter (INDEPENDENT_AMBULATORY_CARE_PROVIDER_SITE_OTHER): Payer: Self-pay | Admitting: *Deleted

## 2007-12-03 ENCOUNTER — Telehealth (INDEPENDENT_AMBULATORY_CARE_PROVIDER_SITE_OTHER): Payer: Self-pay | Admitting: Internal Medicine

## 2007-12-25 ENCOUNTER — Telehealth (INDEPENDENT_AMBULATORY_CARE_PROVIDER_SITE_OTHER): Payer: Self-pay | Admitting: Internal Medicine

## 2007-12-31 ENCOUNTER — Encounter (INDEPENDENT_AMBULATORY_CARE_PROVIDER_SITE_OTHER): Payer: Self-pay | Admitting: *Deleted

## 2008-01-09 IMAGING — CT CT CHEST W/ CM
1 of 3 series · 13 of 32 positions shown, 18 images · IV contrast (APPLIED)
Comparison: Chest CT dated 08/24/2003

CLINICAL DATA: Unexplained weight loss with anorexia. Bilateral inguinal adenopathy. Thrombocytopenia. 
CHEST CT WITH CONTRAST:
TECHNIQUE: Multidetector CT imaging of the chest was performed following the standard protocol during bolus administration of intravenous contrast.
Contrast:  100 cc Omnipaque 300
TECHNIQUE: Multidetector CT imaging of the abdomen was performed following the standard protocol during bolus administration of intravenous contrast.
TECHNIQUE: Multidetector CT imaging of the pelvis was performed following the standard protocol during bolus administration of intravenous contrast.

[Series 5: abd/pel 5.0 b31f · axial · 0.87mm/px · z∈[-586,-170]mm · 13 of 97 slices shown, 18 images]
[im 7/97  soft-tissue]
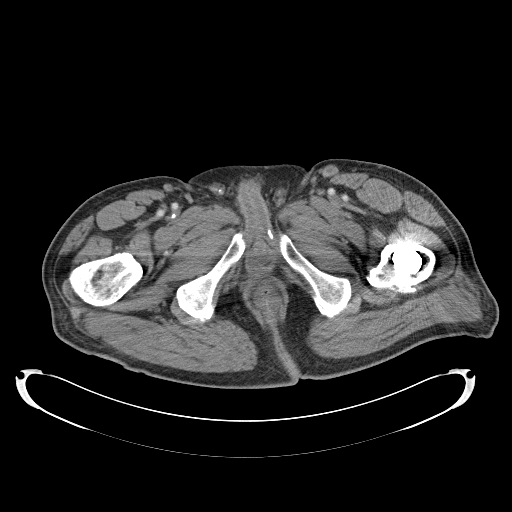
[im 7/97  bone]
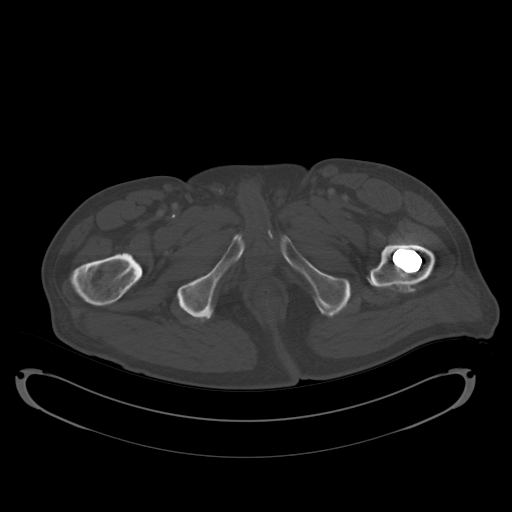
[im 13/97  soft-tissue]
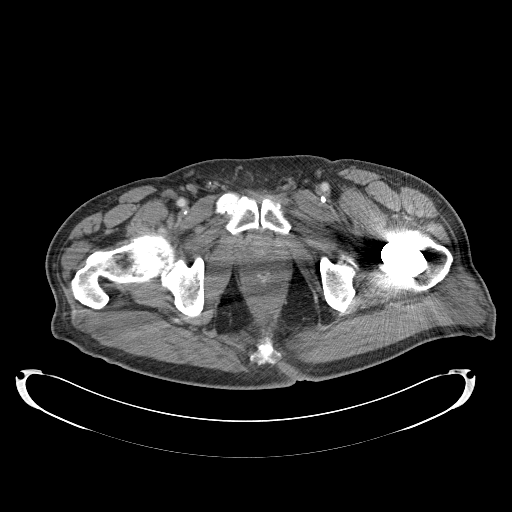
[im 20/97  soft-tissue]
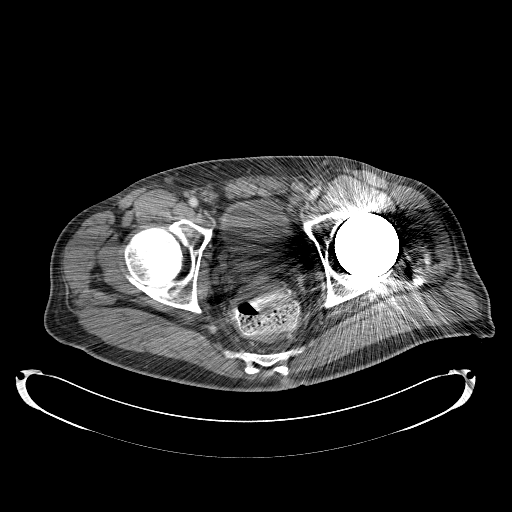
[im 33/97  soft-tissue]
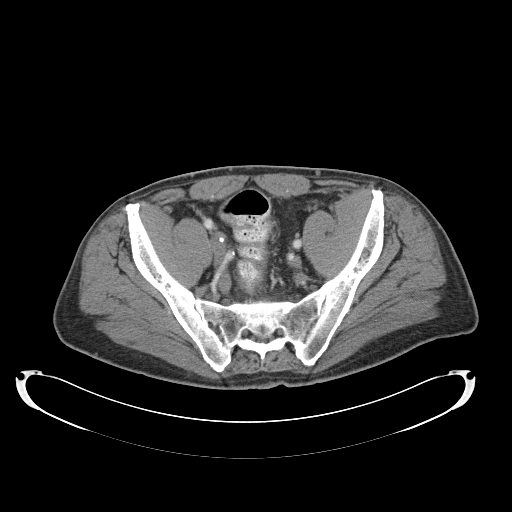
[im 39/97  soft-tissue]
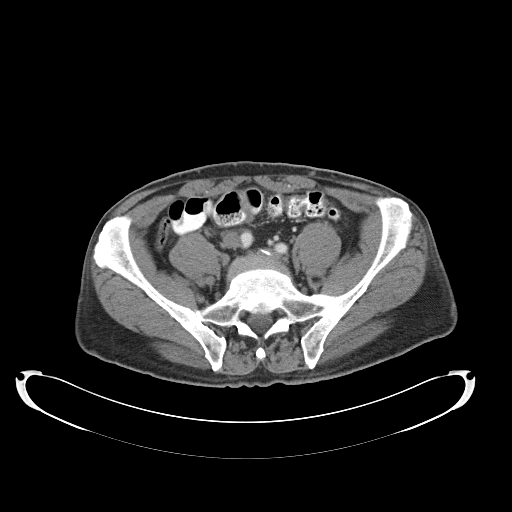
[im 45/97  soft-tissue]
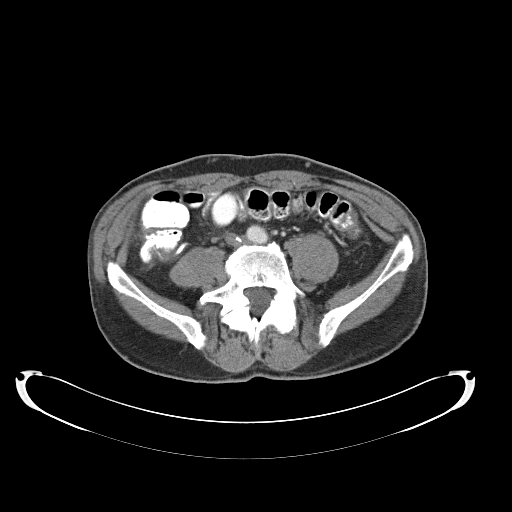
[im 52/97  soft-tissue]
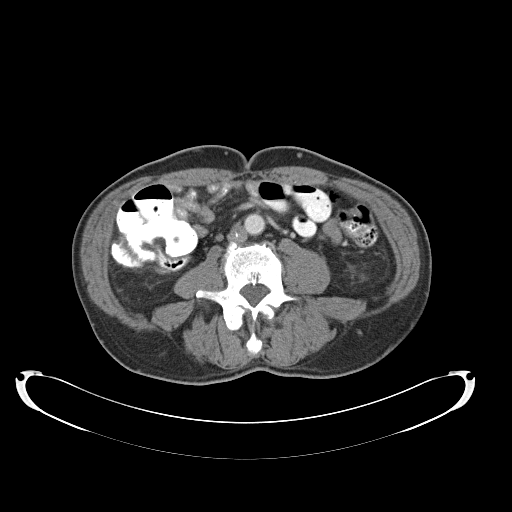
[im 58/97  soft-tissue]
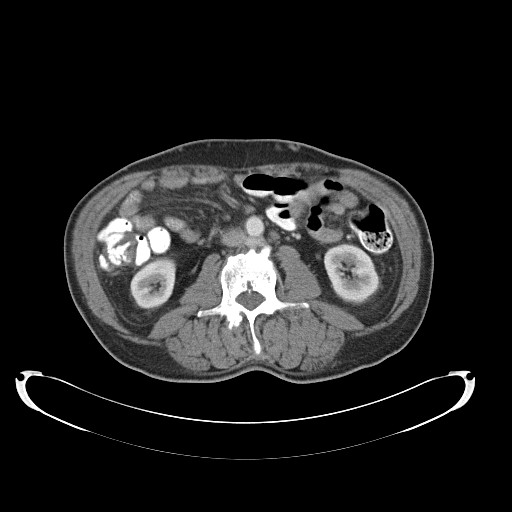
[im 65/97  soft-tissue]
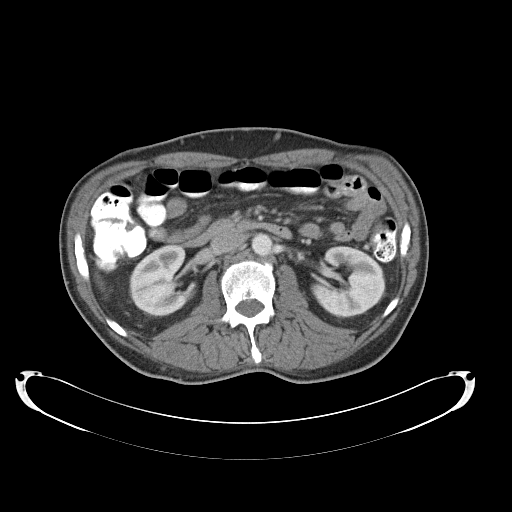
[im 65/97  bone]
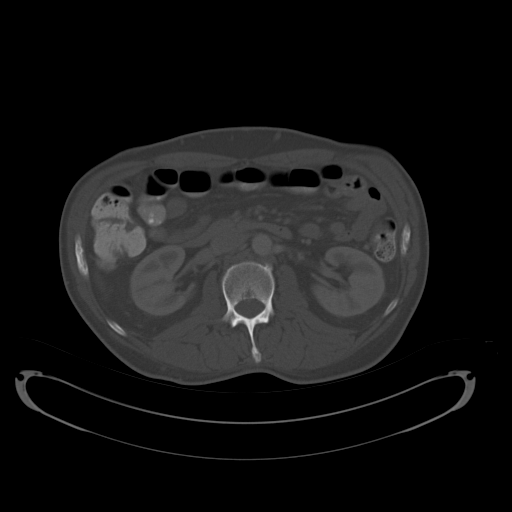
[im 71/97  lung]
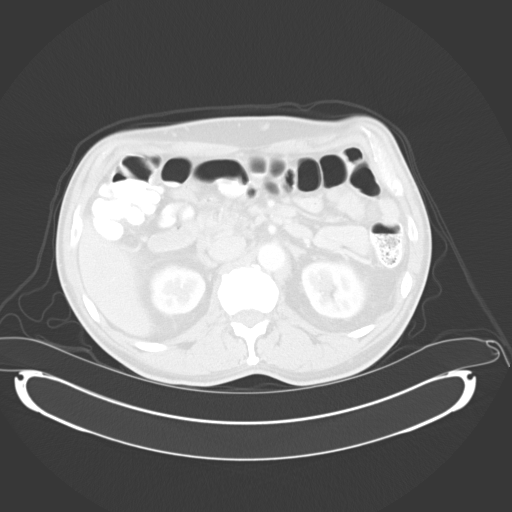
[im 77/97  soft-tissue]
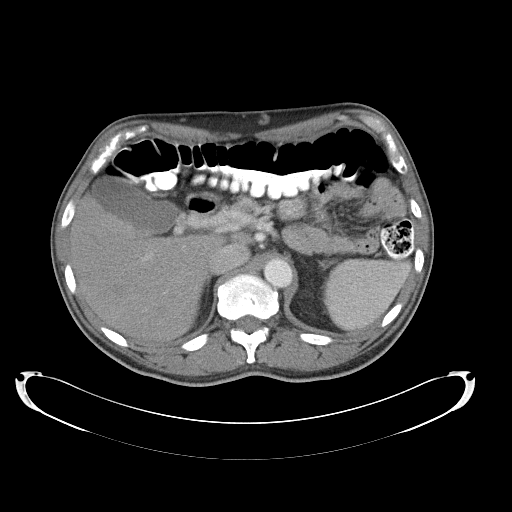
[im 77/97  lung]
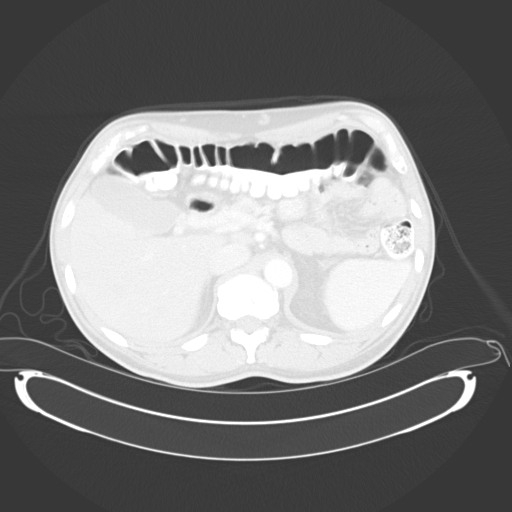
[im 84/97  soft-tissue]
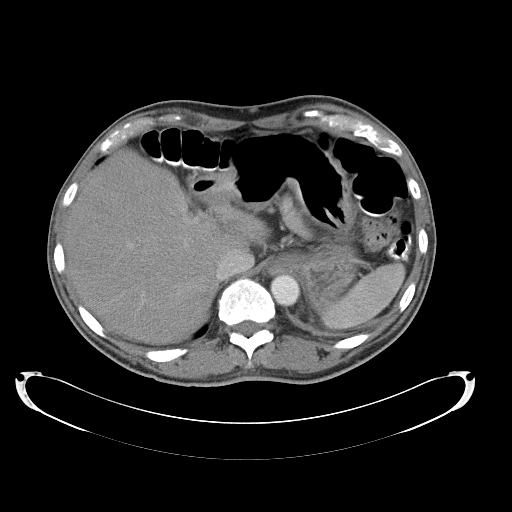
[im 84/97  lung]
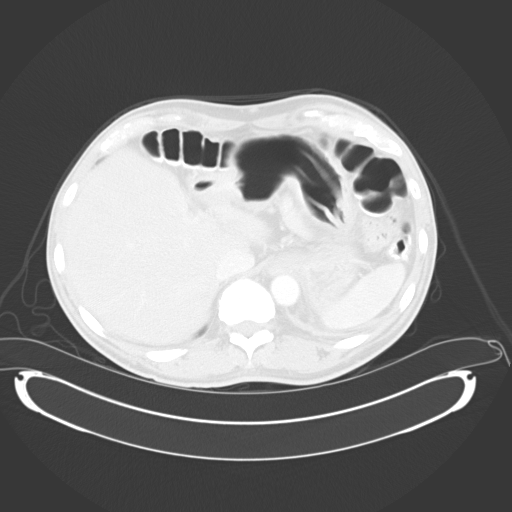
[im 90/97  soft-tissue]
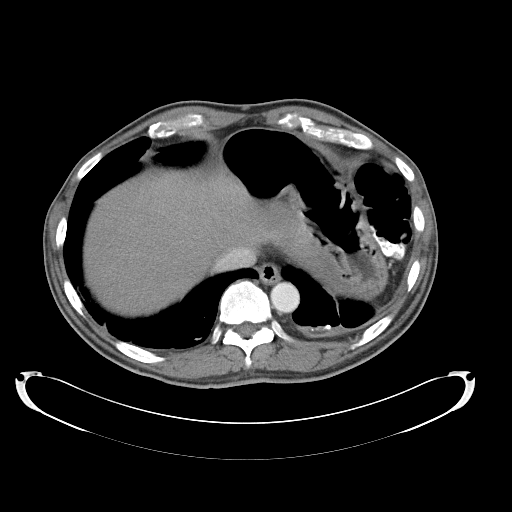
[im 90/97  lung]
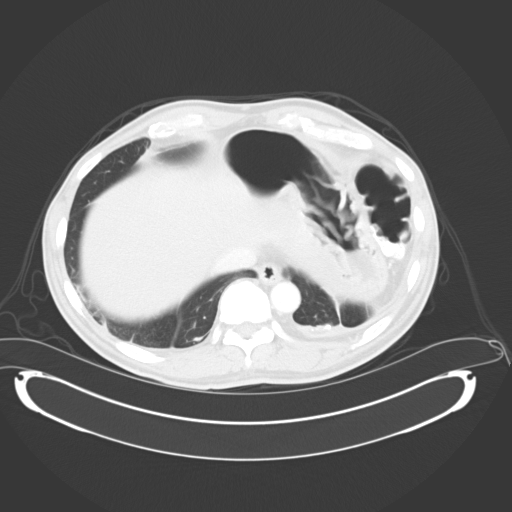

[13 of 32 positions shown; findings below may reference images not displayed]

FINDINGS: There is extensive scarring in the left lung with some pleural calcifications peripherally adjacent to the left lower lobe.  No hilar or mediastinal adenopathy. There is some scarring at the right lung base as well as in the right middle lobe, stable. Heart and mediastinal structures are shifted to the left due to volume loss in the left hemithorax. Transvenous pacer is in place. No significant bony abnormality. 
No significant adenopathy in the axillae.
IMPRESSION: No acute disease in the chest. No change since the prior study of 08/24/2003. Stable scarring in both lungs. 
ABDOMEN CT WITH CONTRAST:
FINDINGS: There is no significant abnormality of the liver, spleen, pancreas, adrenal glands, or kidneys. No dilated bowel or adenopathy. No free air or free fluid. No significant bony abnormality. There is fairly severe degenerative disk disease at L3-4 and bilateral pars defects at L5-S1 with a grade I spondylolisthesis at that level.
IMPRESSION: No acute abnormalities.
PELVIS CT WITH CONTRAST:
FINDINGS: There are some calcifications within the distal inferior vena cava and both iliac veins, probably calcified chronic thrombi. Does the patient have a history of deep venous thrombosis? The calcifications do go into the left internal iliac vein. Calcifications also extend into the veins of the proximal thighs. No other significant abnormalities.
IMPRESSION: No acute abnormality of the pelvis. Probable chronic thrombi in the iliac veins and distal inferior vena cava, now calcified.

## 2008-01-18 ENCOUNTER — Telehealth (INDEPENDENT_AMBULATORY_CARE_PROVIDER_SITE_OTHER): Payer: Self-pay | Admitting: Internal Medicine

## 2008-01-20 ENCOUNTER — Telehealth (INDEPENDENT_AMBULATORY_CARE_PROVIDER_SITE_OTHER): Payer: Self-pay | Admitting: Internal Medicine

## 2008-01-20 ENCOUNTER — Ambulatory Visit: Payer: Self-pay | Admitting: Internal Medicine

## 2008-01-29 IMAGING — CR DG ABDOMEN 2V
2 series · 2 of 2 positions shown · non-contrast
Comparison: none

CLINICAL DATA: Constipation

[w abdomen upright]
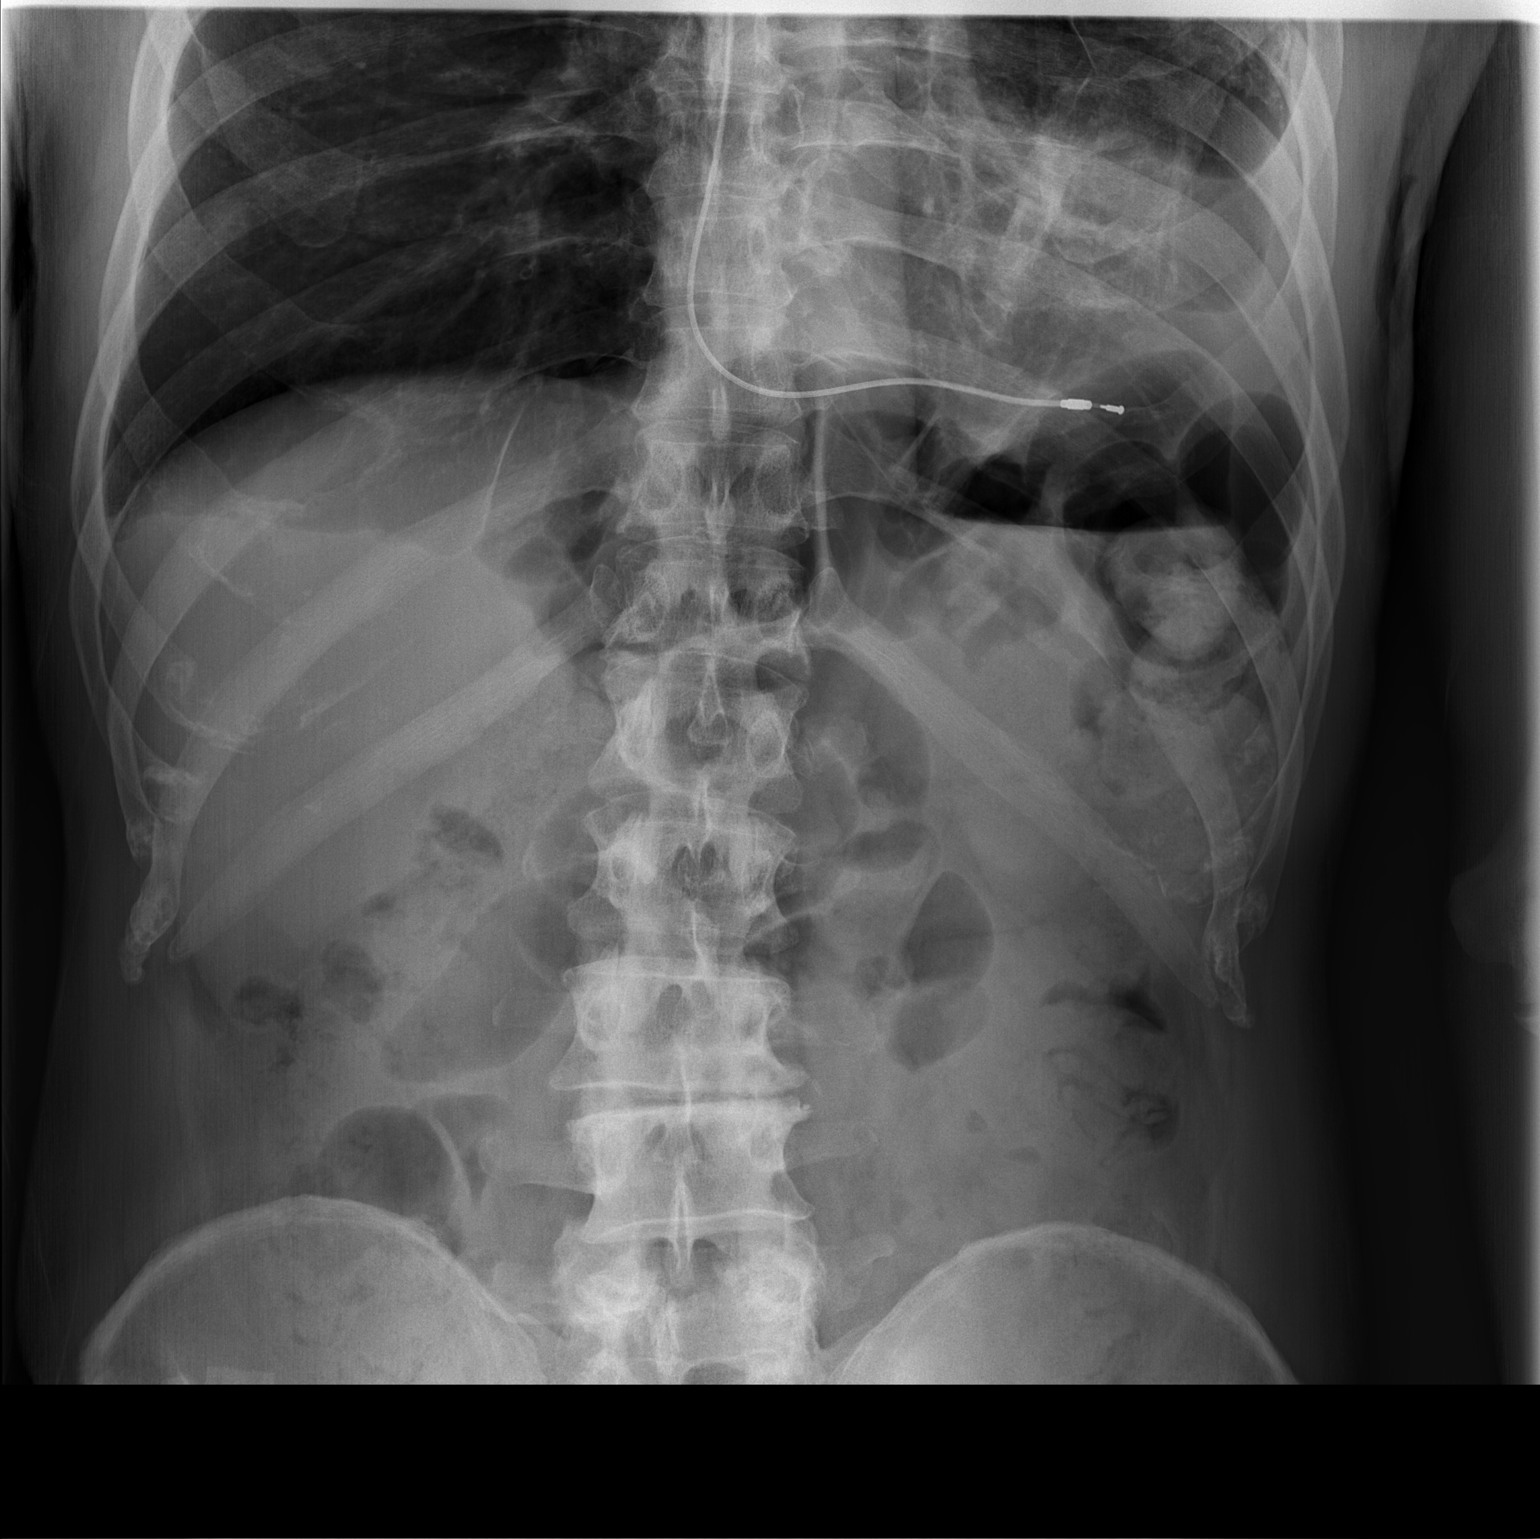

[t abdomen supine]
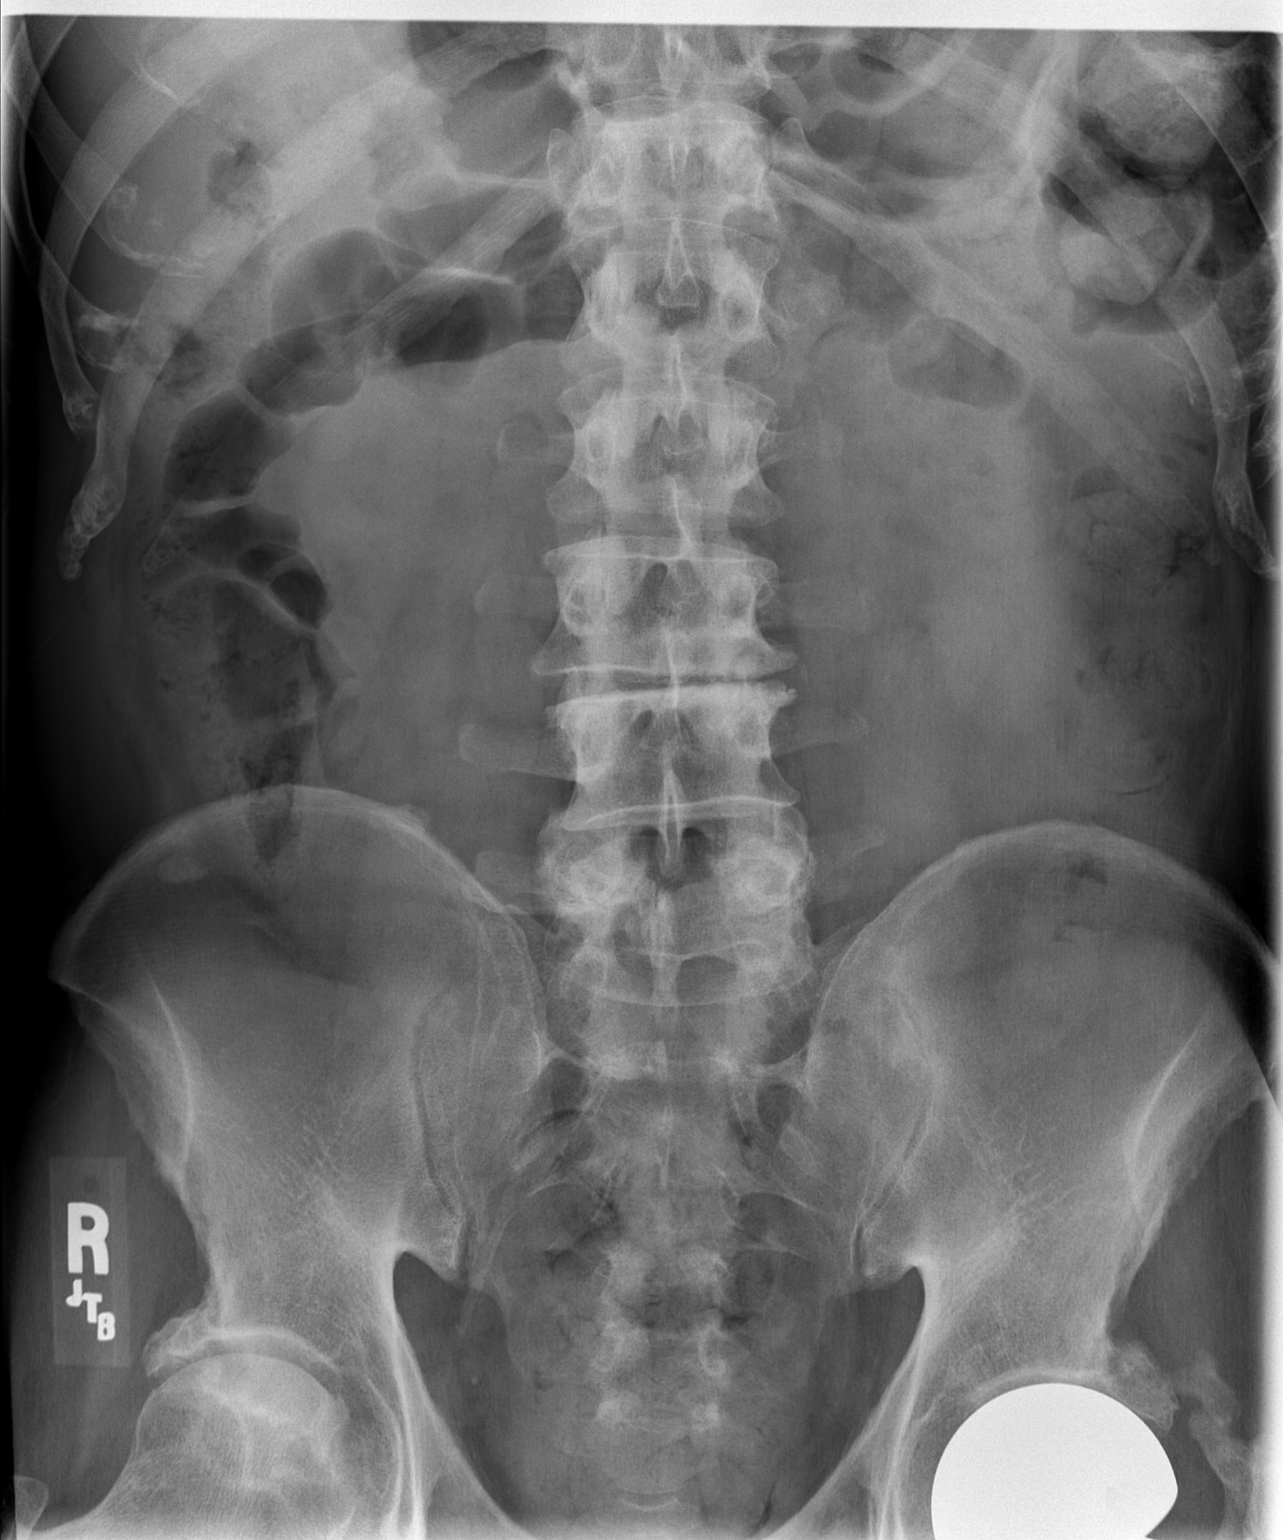

[2 of 2 positions shown; findings below may reference images not displayed]

Abdomen 2 view:

Comparison 10/17/2006. Pacing lead is noted to the right ventricular apex. No
free air. No abnormal abdominal calcifications. Small bowel is nondilated. Gas
and fecal material throughout the colon which is nondilated. Degenerative
changes in the lower lumbar spine and right hip. Left femoral head prosthesis
partially seen. There is a small left pleural effusion or pleural thickening.
IMPRESSION: 1. Nonobstructed bowel gas pattern. No free air.

## 2008-02-01 ENCOUNTER — Telehealth: Payer: Self-pay | Admitting: *Deleted

## 2008-02-01 ENCOUNTER — Encounter (INDEPENDENT_AMBULATORY_CARE_PROVIDER_SITE_OTHER): Payer: Self-pay | Admitting: Internal Medicine

## 2008-02-01 ENCOUNTER — Encounter: Payer: Self-pay | Admitting: Internal Medicine

## 2008-03-02 ENCOUNTER — Ambulatory Visit (HOSPITAL_COMMUNITY): Admission: RE | Admit: 2008-03-02 | Discharge: 2008-03-02 | Payer: Self-pay | Admitting: Infectious Disease

## 2008-03-02 ENCOUNTER — Ambulatory Visit: Payer: Self-pay | Admitting: Infectious Disease

## 2008-03-02 ENCOUNTER — Encounter (INDEPENDENT_AMBULATORY_CARE_PROVIDER_SITE_OTHER): Payer: Self-pay | Admitting: Internal Medicine

## 2008-03-02 LAB — CONVERTED CEMR LAB
Basophils Absolute: 0 10*3/uL (ref 0.0–0.1)
CO2: 23 meq/L (ref 19–32)
Chloride: 105 meq/L (ref 96–112)
Creatinine, Ser: 1.57 mg/dL — ABNORMAL HIGH (ref 0.40–1.50)
Hemoglobin: 17.6 g/dL — ABNORMAL HIGH (ref 13.0–17.0)
Lymphocytes Relative: 27 % (ref 12–46)
Monocytes Absolute: 0.8 10*3/uL (ref 0.1–1.0)
Monocytes Relative: 10 % (ref 3–12)
Neutro Abs: 4.8 10*3/uL (ref 1.7–7.7)
Potassium: 3.9 meq/L (ref 3.5–5.3)
RBC: 5.57 M/uL (ref 4.22–5.81)
RDW: 13.8 % (ref 11.5–15.5)

## 2008-03-08 ENCOUNTER — Inpatient Hospital Stay (HOSPITAL_COMMUNITY): Admission: RE | Admit: 2008-03-08 | Discharge: 2008-03-18 | Payer: Self-pay | Admitting: Orthopedic Surgery

## 2008-03-08 HISTORY — PX: TOTAL HIP ARTHROPLASTY: SHX124

## 2008-03-10 ENCOUNTER — Encounter (INDEPENDENT_AMBULATORY_CARE_PROVIDER_SITE_OTHER): Payer: Self-pay | Admitting: Cardiovascular Disease

## 2008-03-20 ENCOUNTER — Inpatient Hospital Stay (HOSPITAL_COMMUNITY): Admission: EM | Admit: 2008-03-20 | Discharge: 2008-03-30 | Payer: Self-pay | Admitting: Emergency Medicine

## 2008-03-25 ENCOUNTER — Telehealth (INDEPENDENT_AMBULATORY_CARE_PROVIDER_SITE_OTHER): Payer: Self-pay | Admitting: Internal Medicine

## 2008-04-01 ENCOUNTER — Encounter (INDEPENDENT_AMBULATORY_CARE_PROVIDER_SITE_OTHER): Payer: Self-pay | Admitting: Internal Medicine

## 2008-04-01 ENCOUNTER — Telehealth (INDEPENDENT_AMBULATORY_CARE_PROVIDER_SITE_OTHER): Payer: Self-pay | Admitting: Internal Medicine

## 2008-04-07 ENCOUNTER — Encounter (INDEPENDENT_AMBULATORY_CARE_PROVIDER_SITE_OTHER): Payer: Self-pay | Admitting: Internal Medicine

## 2008-04-26 ENCOUNTER — Telehealth (INDEPENDENT_AMBULATORY_CARE_PROVIDER_SITE_OTHER): Payer: Self-pay | Admitting: Internal Medicine

## 2008-04-26 ENCOUNTER — Encounter (INDEPENDENT_AMBULATORY_CARE_PROVIDER_SITE_OTHER): Payer: Self-pay | Admitting: Internal Medicine

## 2008-05-26 IMAGING — CT CT ABDOMEN W/ CM
2 of 6 series · 16 of 46 positions shown, 18 images · IV contrast (omnipaque)
Comparison: Abdominal and pelvic CT 10/17/06.

CLINICAL DATA: Low abdominal pain.  Unresponsive.  
ABDOMEN CT WITH CONTRAST:
TECHNIQUE: Multidetector CT imaging of the abdomen was performed following the standard protocol during bolus administration of intravenous contrast.
Contrast:  100 cc Omnipaque 300.  Oral contrast was given.
TECHNIQUE: Multidetector CT imaging of the pelvis was performed following the standard protocol during bolus administration of intravenous contrast.

[Series 2: abd/pelv with 5.0 b31f st · axial · 0.70mm/px · z∈[-576,-186]mm · 13 of 90 slices shown, 15 images]
[im 6/90  soft-tissue]
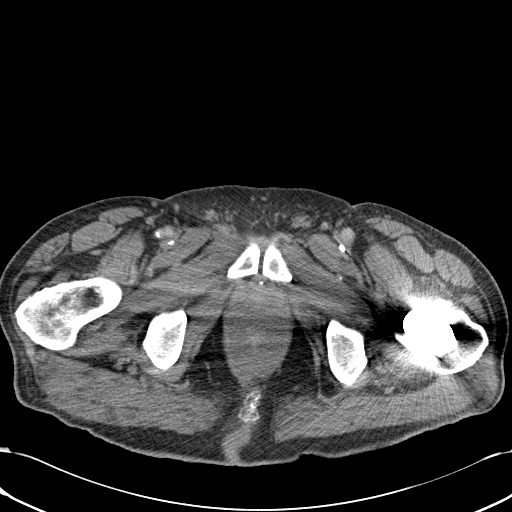
[im 6/90  bone]
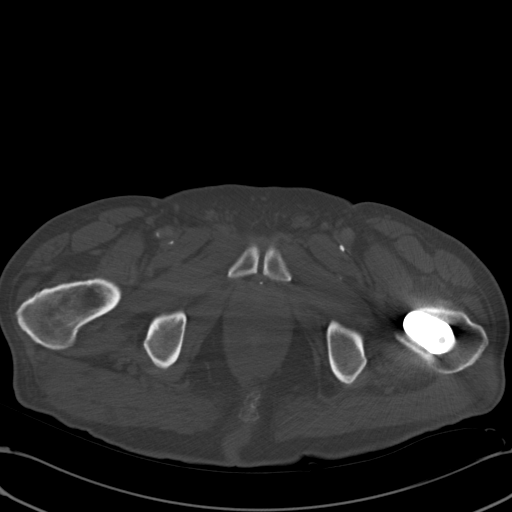
[im 11/90  soft-tissue]
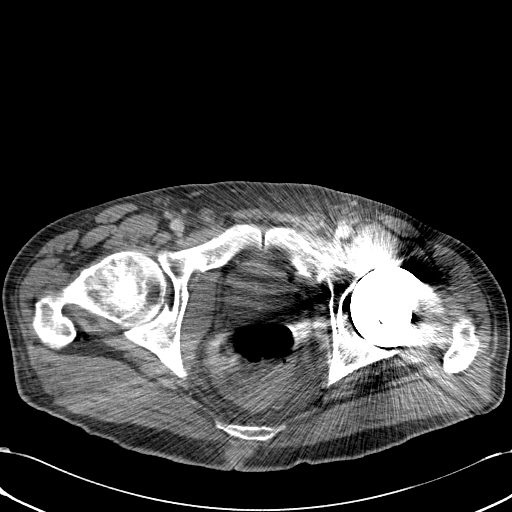
[im 21/90  soft-tissue]
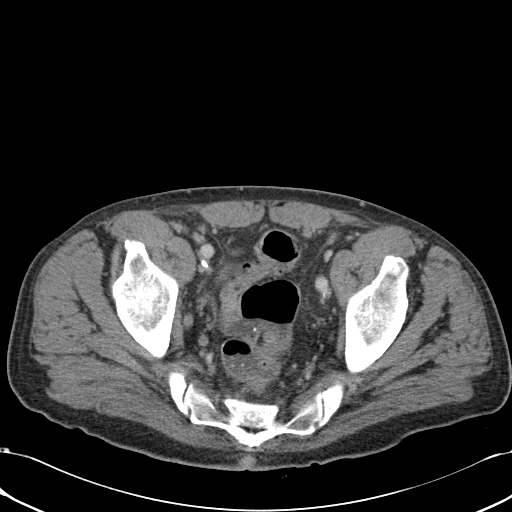
[im 27/90  soft-tissue]
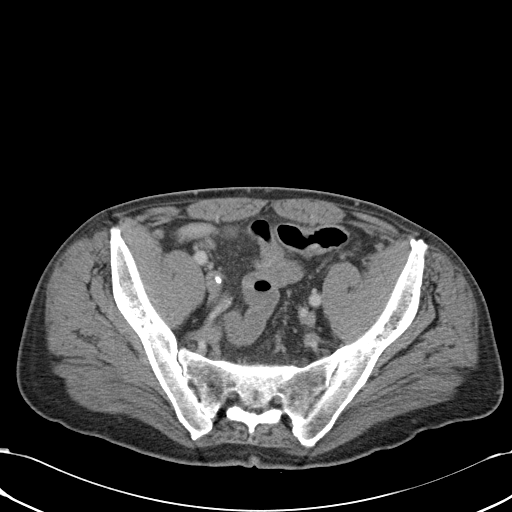
[im 32/90  soft-tissue]
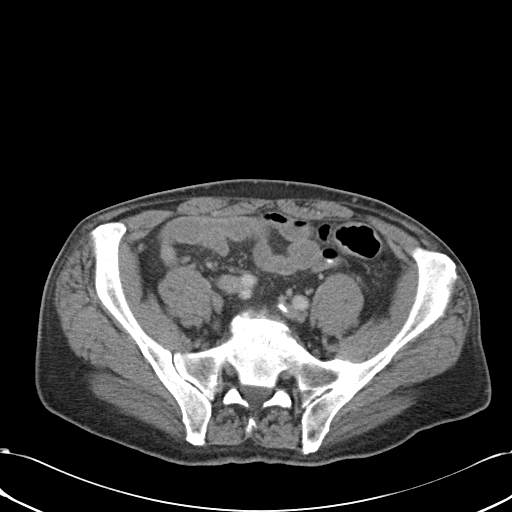
[im 37/90  soft-tissue]
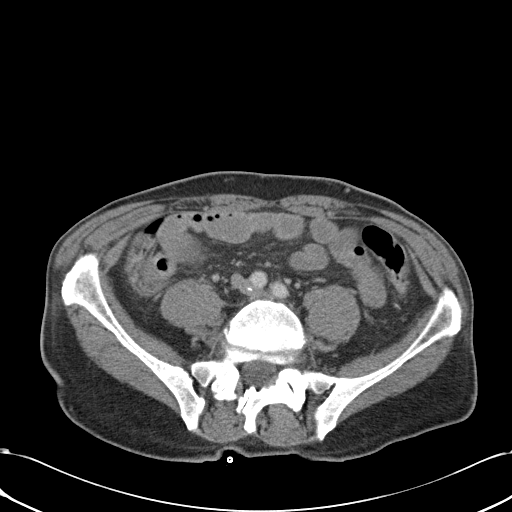
[im 48/90  soft-tissue]
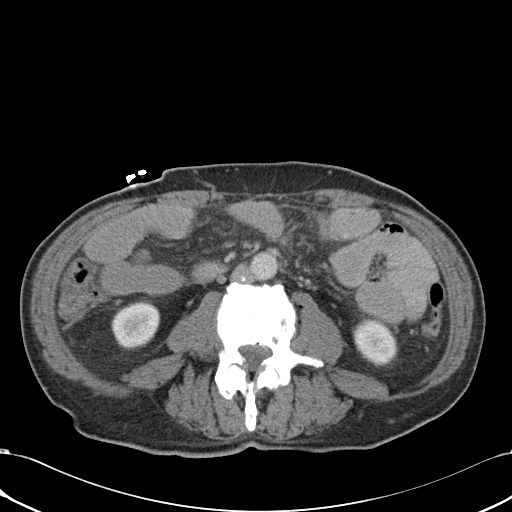
[im 53/90  soft-tissue]
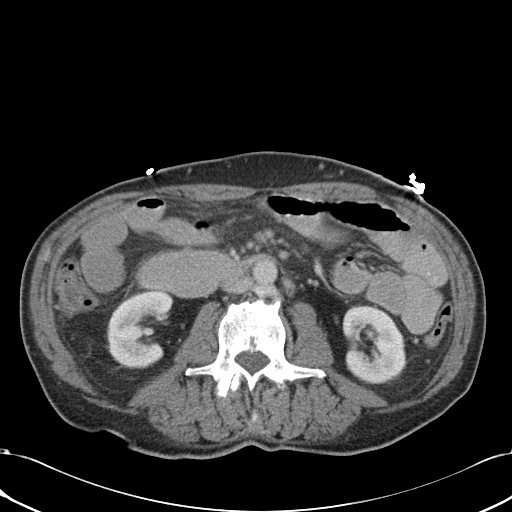
[im 58/90  soft-tissue]
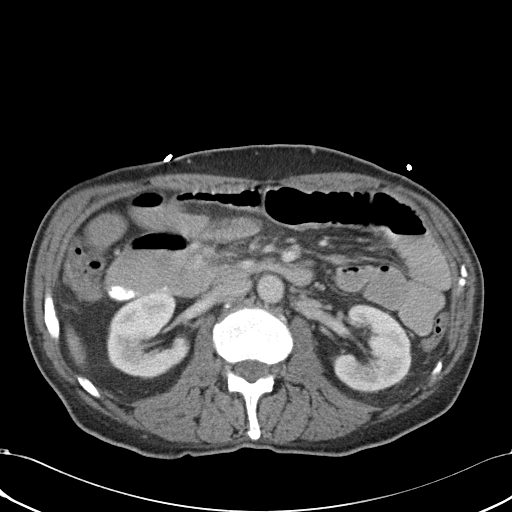
[im 58/90  bone]
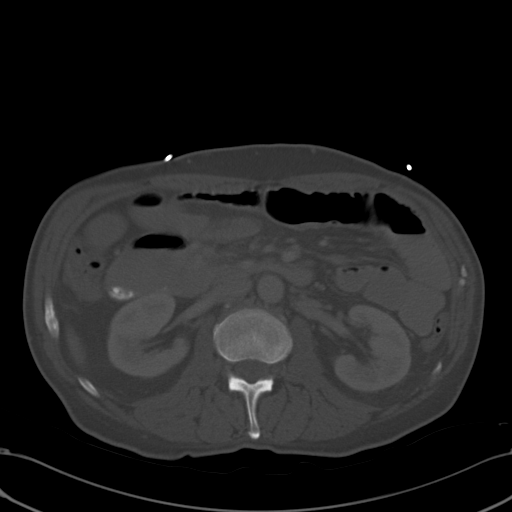
[im 63/90  soft-tissue]
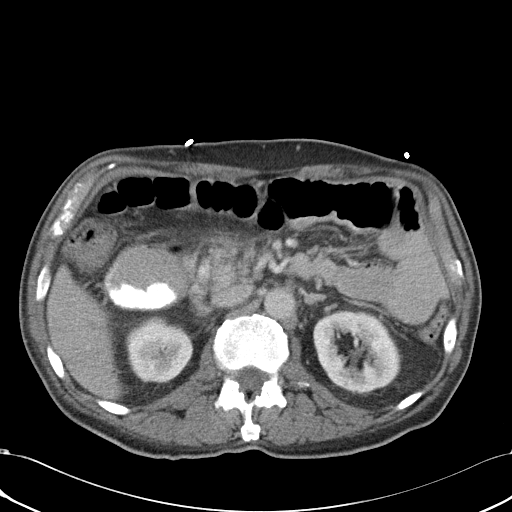
[im 69/90  soft-tissue]
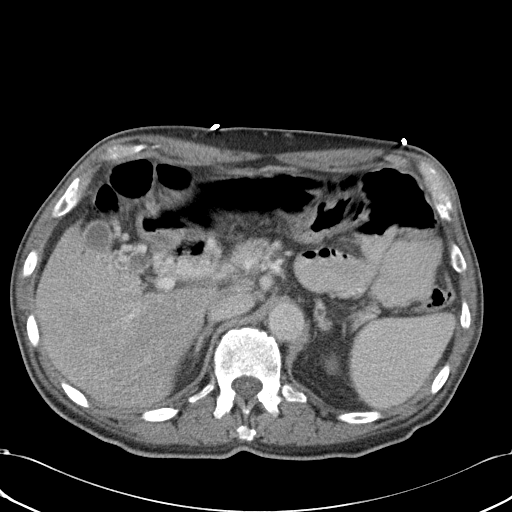
[im 79/90  soft-tissue]
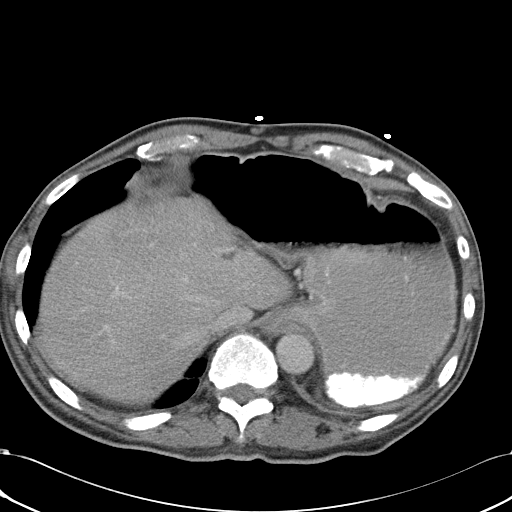
[im 84/90  soft-tissue]
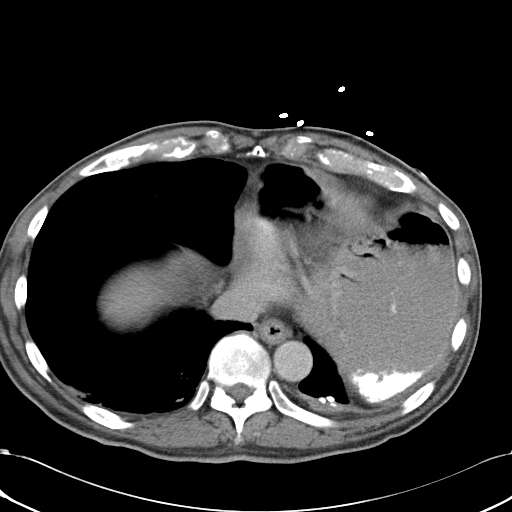

[Series 4: abd/pelv with 2.0 spo cor st · coronal · 0.87mm/px · 3 of 107 slices shown]
[im 36/107  soft-tissue]
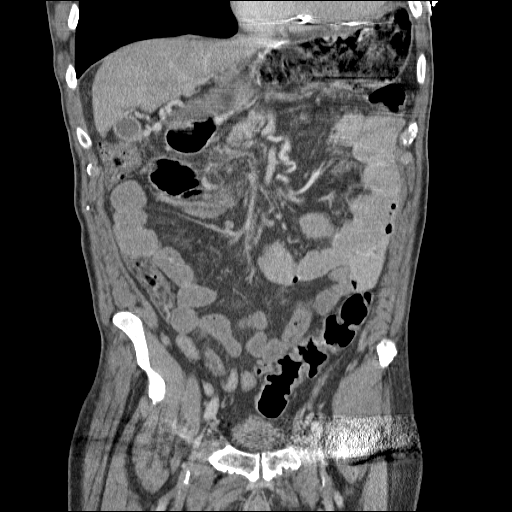
[im 48/107  soft-tissue]
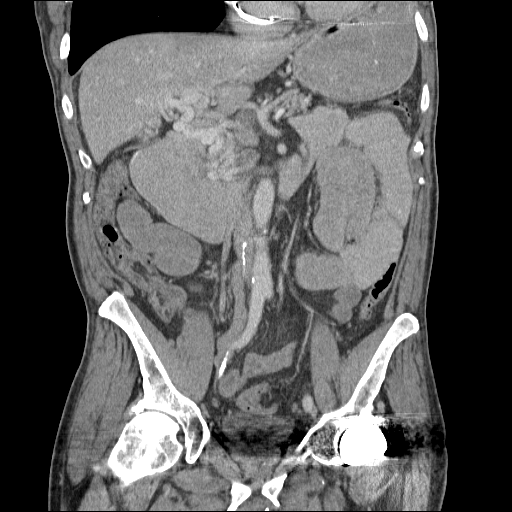
[im 59/107  soft-tissue]
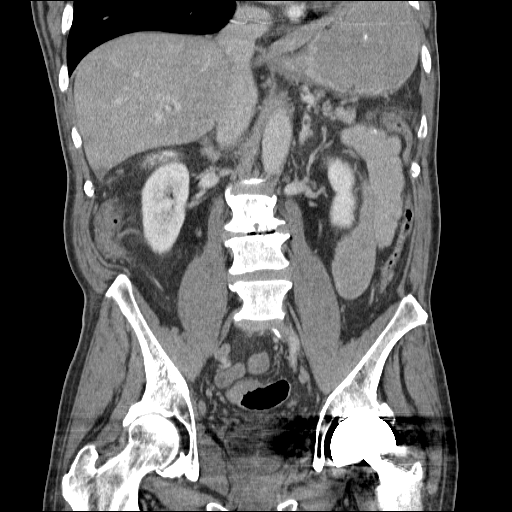

[16 of 46 positions shown; findings below may reference images not displayed]

FINDINGS: There is elevation of the left hemidiaphragm with pleural thickening at the left costophrenic angle.  The left lung base is incompletely imaged.  Pleural calcifications on the left appear stable.  
The patient has apparent chronic portal vein thrombosis with cavernous transformation.  Multiple collateral vessels are present within the porta hepatis.  The left lobe of the liver appears markedly atrophied.  Some of the central intrahepatic bile ducts appear mildly dilated, but unchanged.  A low density lesion in the right hepatic lobe measuring 5 mm in diameter on image 13 is unchanged.  There are no new liver lesions.  The gallbladder appears normal.  There is no significant splenomegaly.  The splenic vein is patent.  The pancreas appears normal. 
The kidneys and adrenal glands appear unremarkable.  There is no adenopathy or bowel dilatation.  Advanced degenerative disc disease at L3-4 asymmetric to the left appears stable.  Cystic end plate degenerative changes and vacuum phenomenon remain.   There is no progressive end plate destruction.
IMPRESSION: 1.  No acute abdominal findings are demonstrated.
2.  Chronic portal vein thrombosis with cavernous transformation and chronic atrophy at the left hepatic lobe.  As correlated with the noncontrast study done 08/19/03, this appears to reflect a chronic finding. 
3.  Stable chronic degenerative disc disease at L3-4. 
PELVIS CT WITH CONTRAST:
FINDINGS: No pelvic mass, fluid collection, or inflammatory process is demonstrated.  The patient is status-post left hip hemiarthroplasty.  There is femoral head osteonecrosis on the right without significant subchondral collapse.  There is a right-sided pars defect at L-5 with bilateral L5-S1 facet disease.  No acute osseous findings are seen.
IMPRESSION: No acute pelvic findings.  Right femoral head osteonecrosis.

## 2008-05-26 IMAGING — CR DG CHEST 1V PORT
2 series · 2 of 2 positions shown · non-contrast
Comparison: Chest film 08/28/06.

CLINICAL DATA: Abdominal pain.  History AMS.
 PORTABLE CHEST - 1 VIEW:

[view not recorded (1 of 2)]
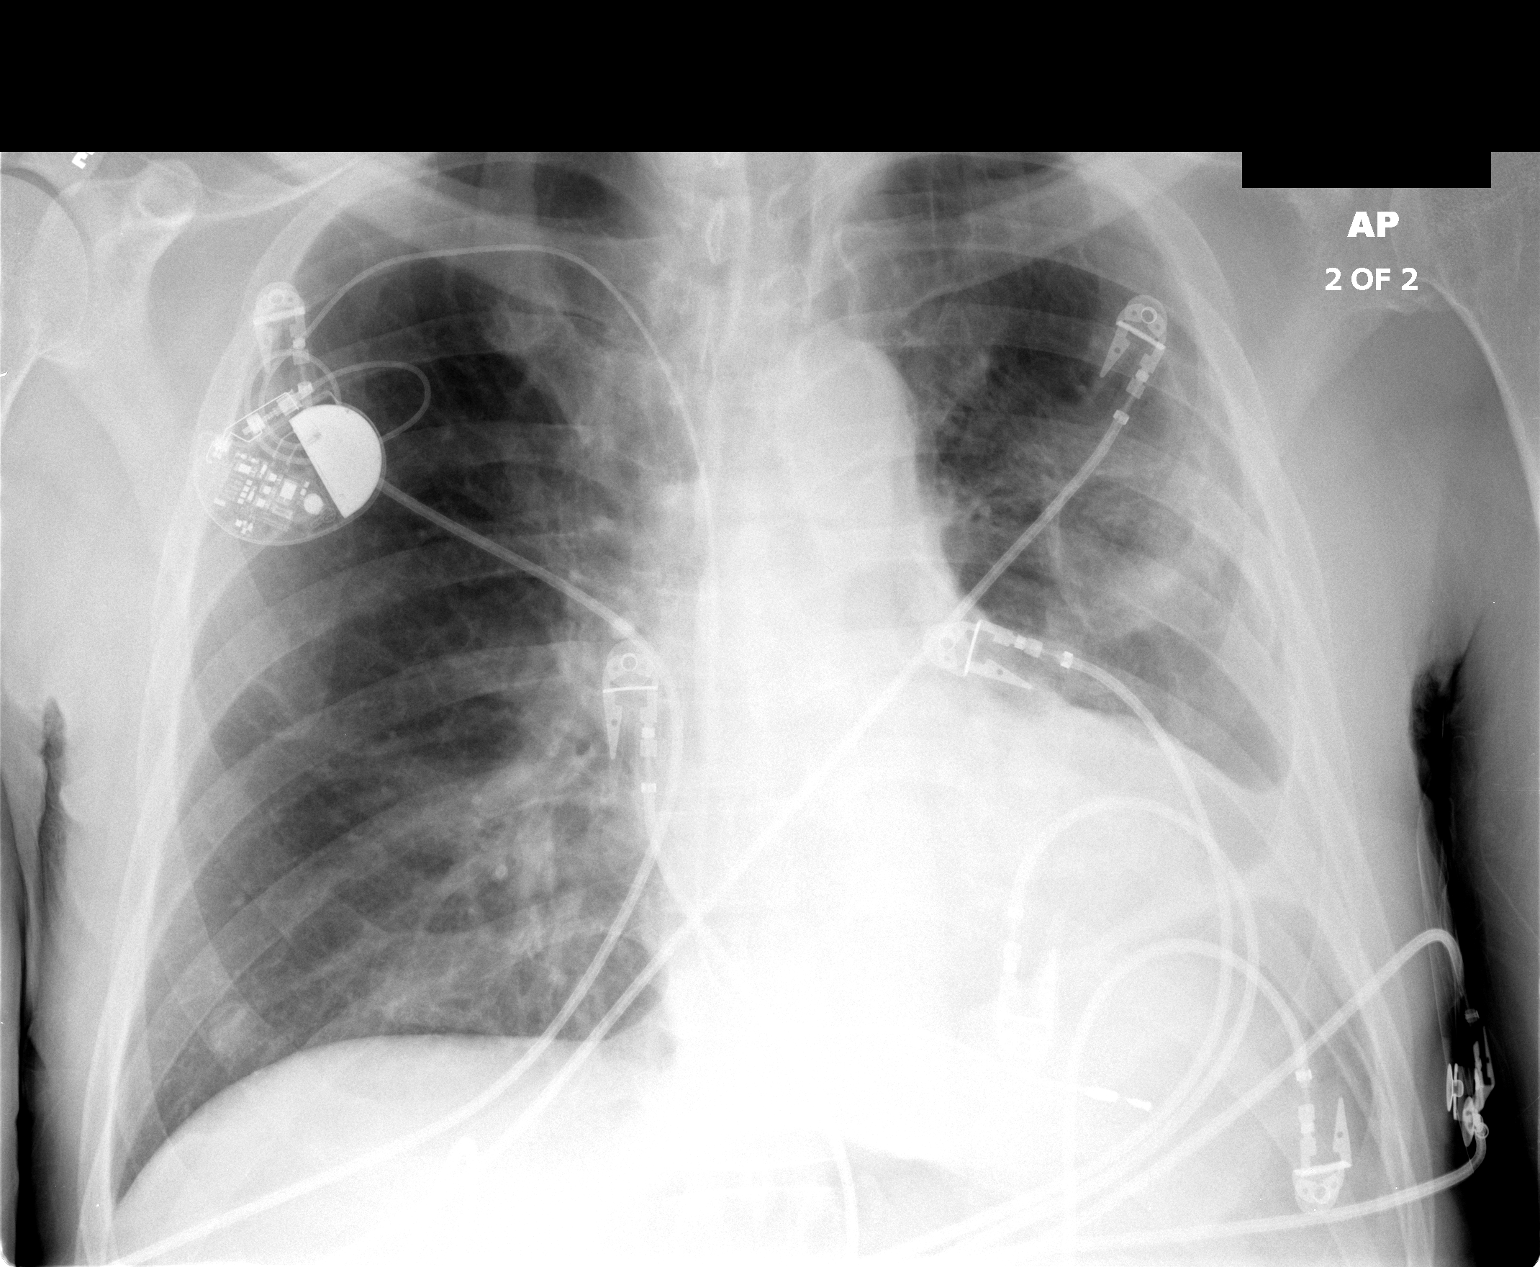

[view not recorded (2 of 2)]
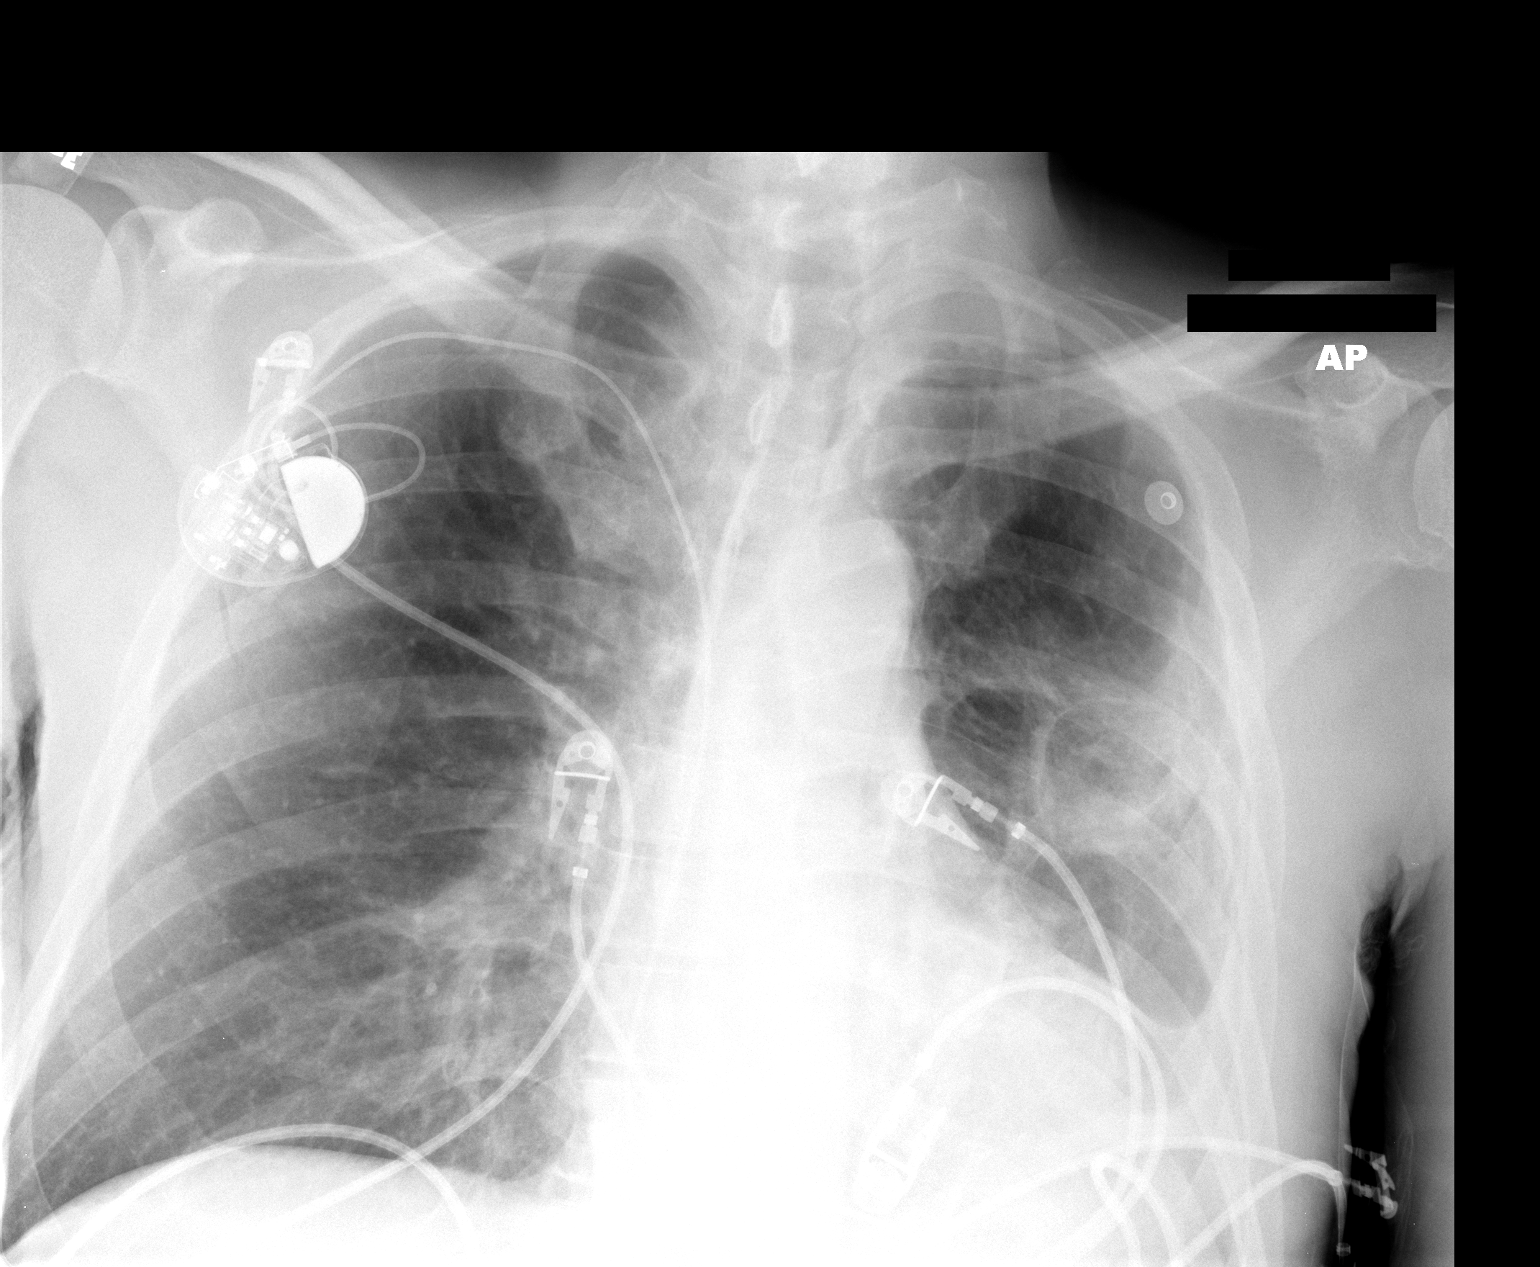

[2 of 2 positions shown; findings below may reference images not displayed]

FINDINGS: Pacer remains in satisfactory position.  Cavitary process within the left midlung with left lower lobe scarring and atelectasis re-demonstrated.  There is no significant change.  The right lung remains clear.  Mild cardiac enlargement.
IMPRESSION: Chronic changes left lung.  Negative for edema or acute infiltrate.

## 2008-05-28 IMAGING — US US ABDOMEN COMPLETE
1 series · 14 of 25 positions shown · non-contrast
Comparison: CT 03/04/2007

CLINICAL DATA: Abdominal pain, elevated bilirubin.

ABDOMEN ULTRASOUND
TECHNIQUE: Complete abdominal ultrasound examination was performed including
evaluation of the liver, gallbladder, bile ducts, pancreas, kidneys, spleen,
IVC, and abdominal aorta.

[Series 1: unknown · 0.30mm/px · 14 of 55 slices shown]
[im 1/55]
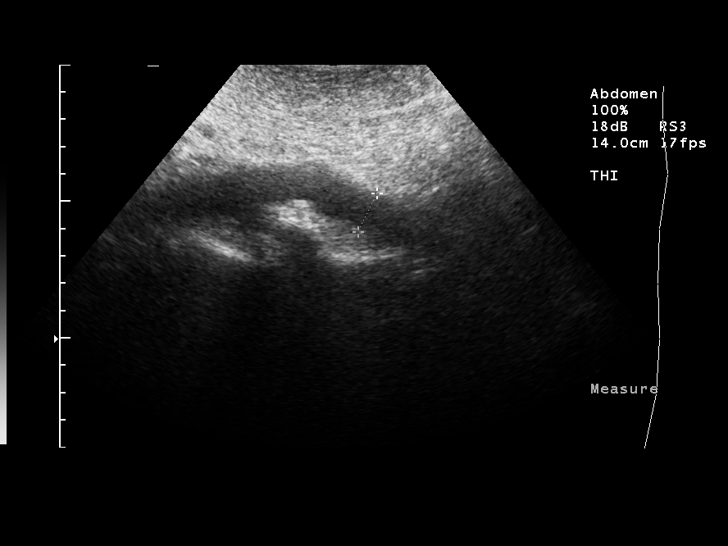
[im 5/55]
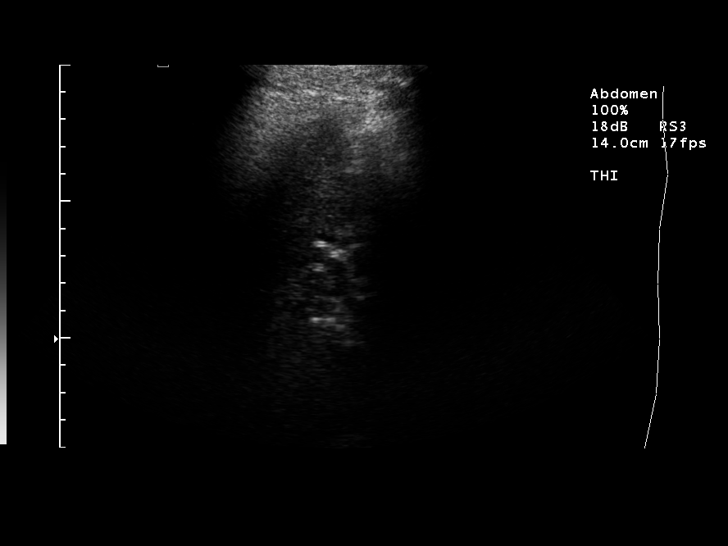
[im 10/55]
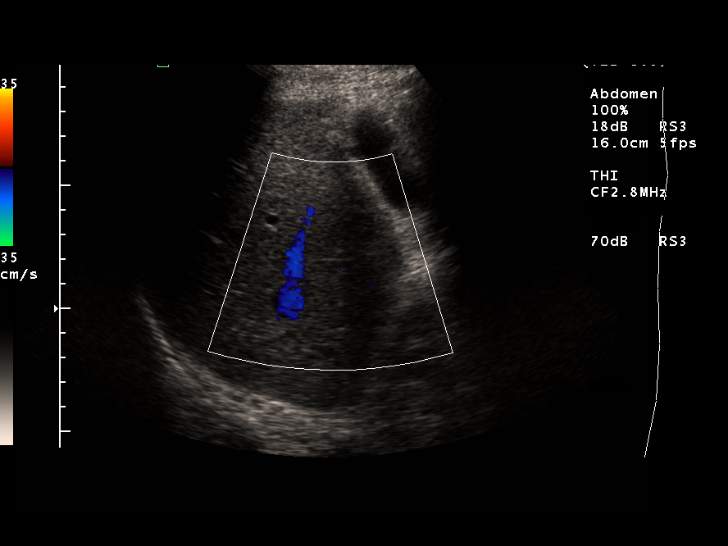
[im 14/55]
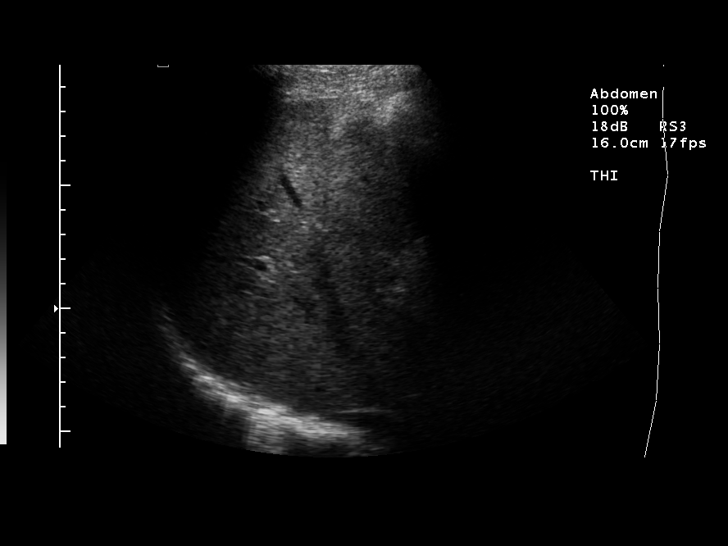
[im 19/55]
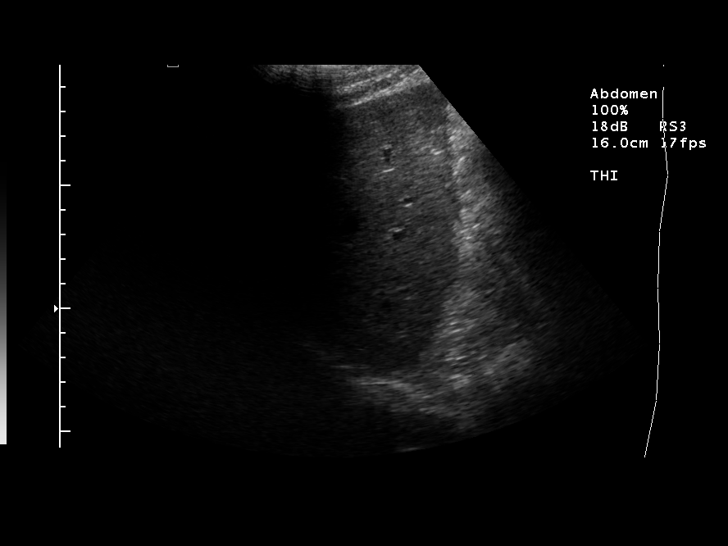
[im 21/55]
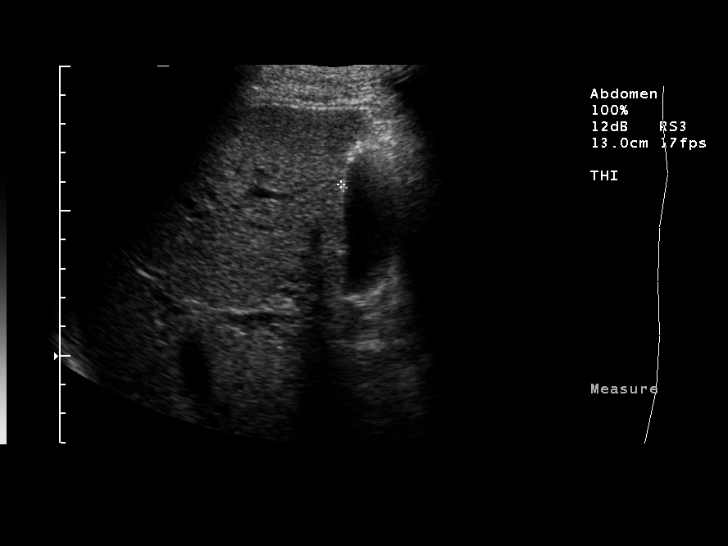
[im 25/55]
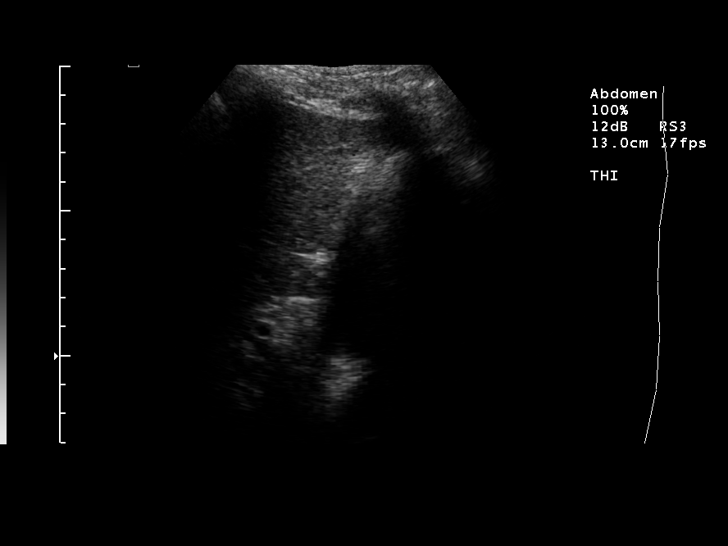
[im 30/55]
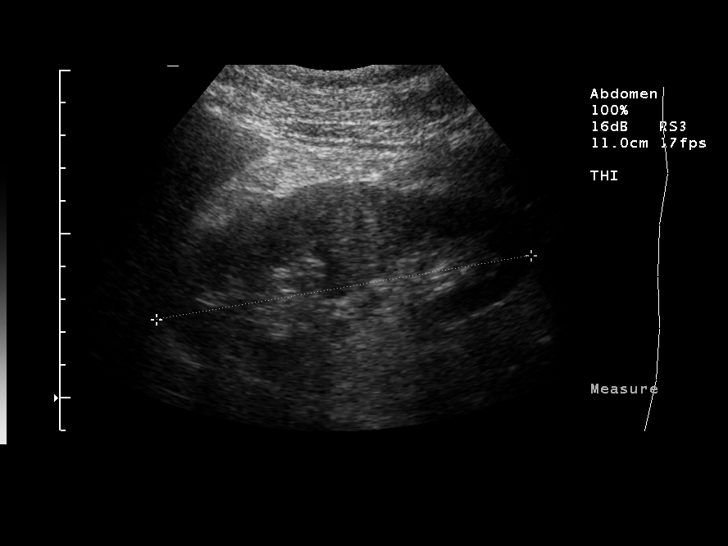
[im 34/55]
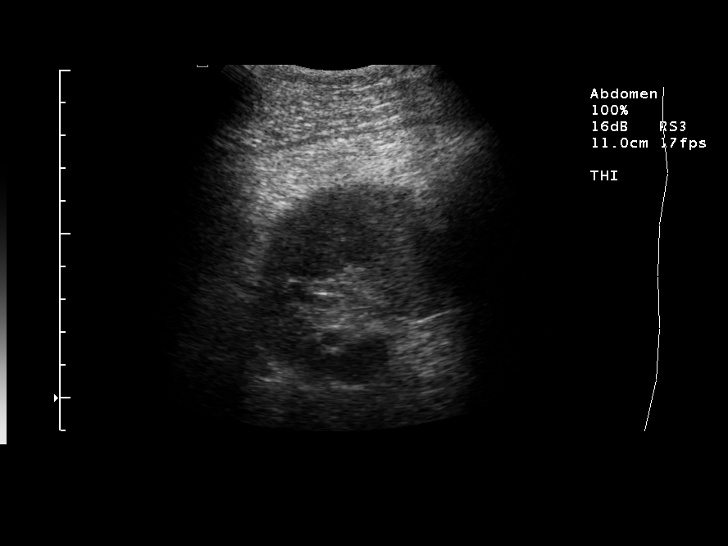
[im 37/55]
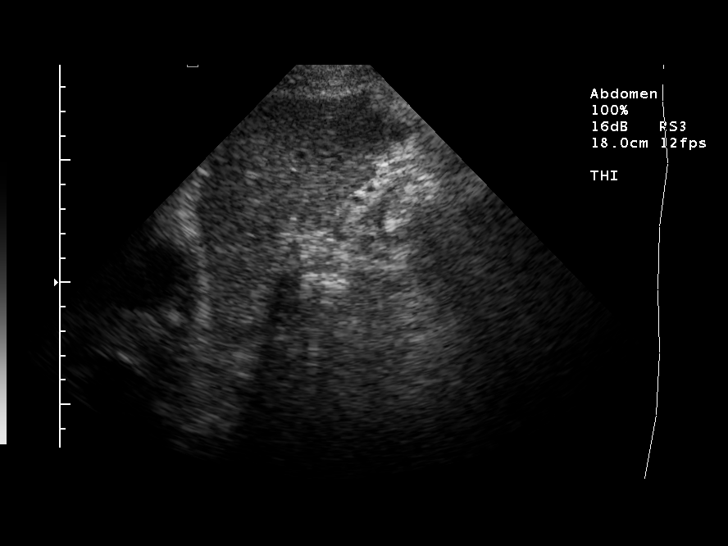
[im 41/55]
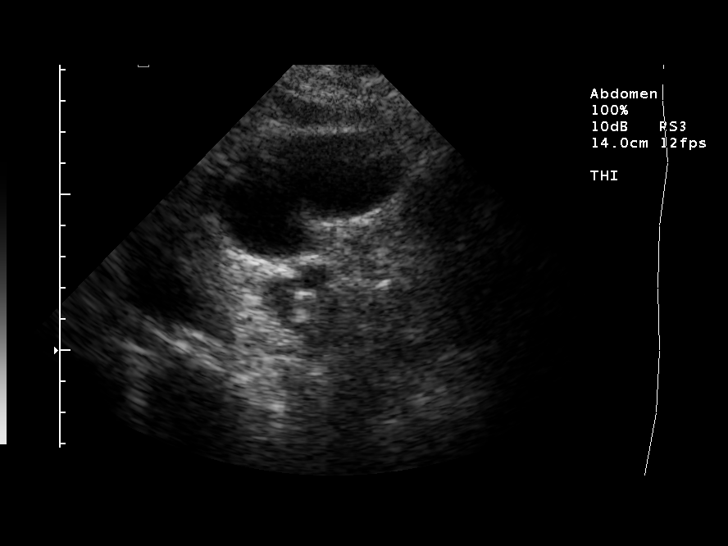
[im 46/55]
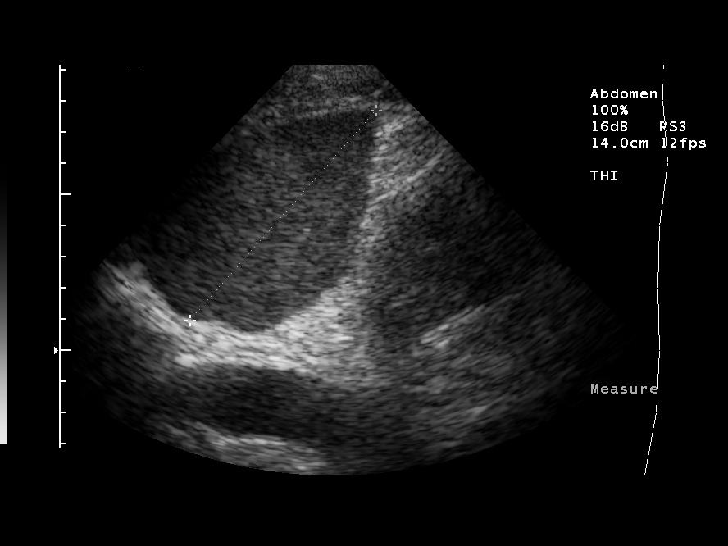
[im 50/55]
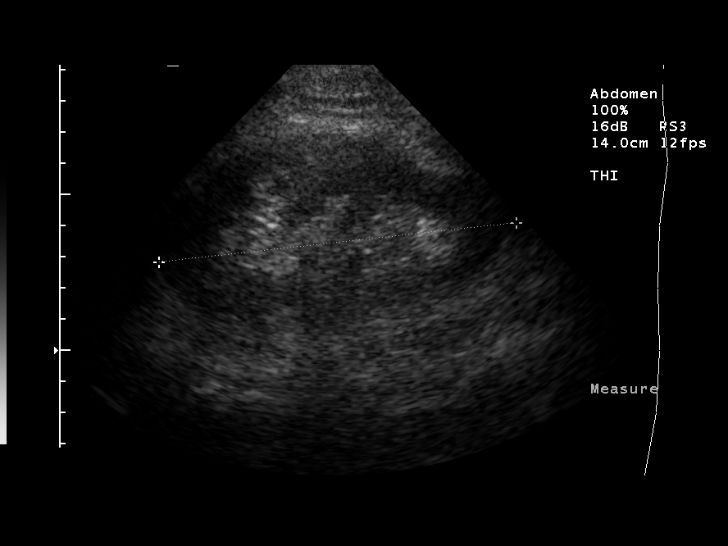
[im 55/55]
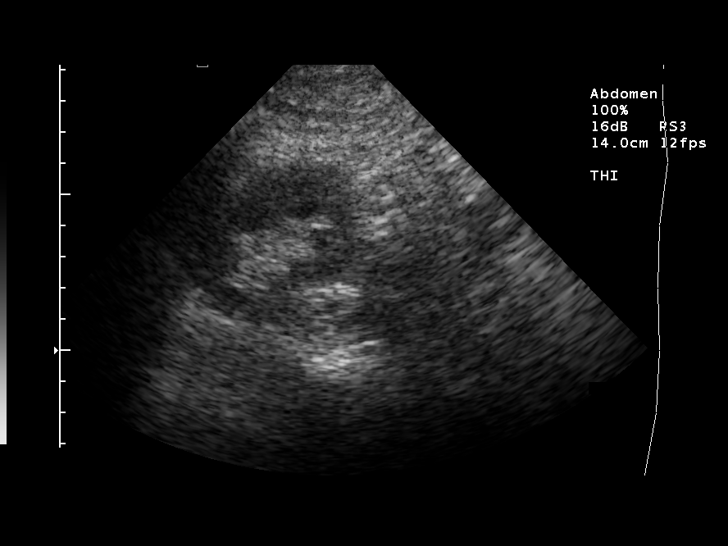

[14 of 25 positions shown; findings below may reference images not displayed]

FINDINGS: No focal lesions in the liver. No biliary ductal dilatation. Common
bile duct is normal at 5 mm. Gallbladder unremarkable. No stones.

IVC and pancreas are not visualized due to overlying bowel gas. Visualized
spleen, kidneys unremarkable. There is tortuosity of the abdominal aorta,
measuring maximally 2.3 cm.

IMPRESSION

No evidence of gallstones or acute cholecystitis. No biliary ductal dilatation.

## 2008-05-30 ENCOUNTER — Telehealth: Payer: Self-pay | Admitting: *Deleted

## 2008-05-30 ENCOUNTER — Encounter (INDEPENDENT_AMBULATORY_CARE_PROVIDER_SITE_OTHER): Payer: Self-pay | Admitting: Internal Medicine

## 2008-06-01 ENCOUNTER — Inpatient Hospital Stay (HOSPITAL_COMMUNITY): Admission: EM | Admit: 2008-06-01 | Discharge: 2008-06-03 | Payer: Self-pay | Admitting: Emergency Medicine

## 2008-06-01 ENCOUNTER — Ambulatory Visit: Payer: Self-pay | Admitting: Internal Medicine

## 2008-06-05 ENCOUNTER — Encounter (INDEPENDENT_AMBULATORY_CARE_PROVIDER_SITE_OTHER): Payer: Self-pay | Admitting: Internal Medicine

## 2008-06-06 ENCOUNTER — Encounter: Payer: Self-pay | Admitting: *Deleted

## 2008-06-15 ENCOUNTER — Inpatient Hospital Stay (HOSPITAL_COMMUNITY): Admission: AD | Admit: 2008-06-15 | Discharge: 2008-06-21 | Payer: Self-pay | Admitting: Cardiovascular Disease

## 2008-06-15 ENCOUNTER — Ambulatory Visit: Payer: Self-pay | Admitting: Internal Medicine

## 2008-06-17 ENCOUNTER — Encounter: Payer: Self-pay | Admitting: Internal Medicine

## 2008-06-17 HISTORY — PX: INSERT / REPLACE / REMOVE PACEMAKER: SUR710

## 2008-06-23 ENCOUNTER — Encounter (INDEPENDENT_AMBULATORY_CARE_PROVIDER_SITE_OTHER): Payer: Self-pay | Admitting: *Deleted

## 2008-06-28 ENCOUNTER — Ambulatory Visit: Payer: Self-pay | Admitting: Internal Medicine

## 2008-06-30 ENCOUNTER — Telehealth: Payer: Self-pay | Admitting: *Deleted

## 2008-07-01 ENCOUNTER — Ambulatory Visit: Payer: Self-pay | Admitting: Internal Medicine

## 2008-07-04 ENCOUNTER — Telehealth (INDEPENDENT_AMBULATORY_CARE_PROVIDER_SITE_OTHER): Payer: Self-pay | Admitting: Internal Medicine

## 2008-07-07 ENCOUNTER — Ambulatory Visit: Payer: Self-pay | Admitting: Internal Medicine

## 2008-07-07 ENCOUNTER — Ambulatory Visit: Payer: Self-pay

## 2008-07-11 ENCOUNTER — Ambulatory Visit: Payer: Self-pay

## 2008-07-11 LAB — CONVERTED CEMR LAB: INR: 1.8 — ABNORMAL HIGH (ref 0.8–1.0)

## 2008-07-15 ENCOUNTER — Ambulatory Visit: Payer: Self-pay | Admitting: Internal Medicine

## 2008-07-15 IMAGING — CR DG CHEST 1V PORT
1 series · 1 of 1 positions shown · non-contrast
Comparison: 03/04/07.

CLINICAL DATA: 61 year old with atrial fibrillation, shortness of breath, and cough.
 PORTABLE CHEST - 1 VIEW - 04/23/07:

[AP]
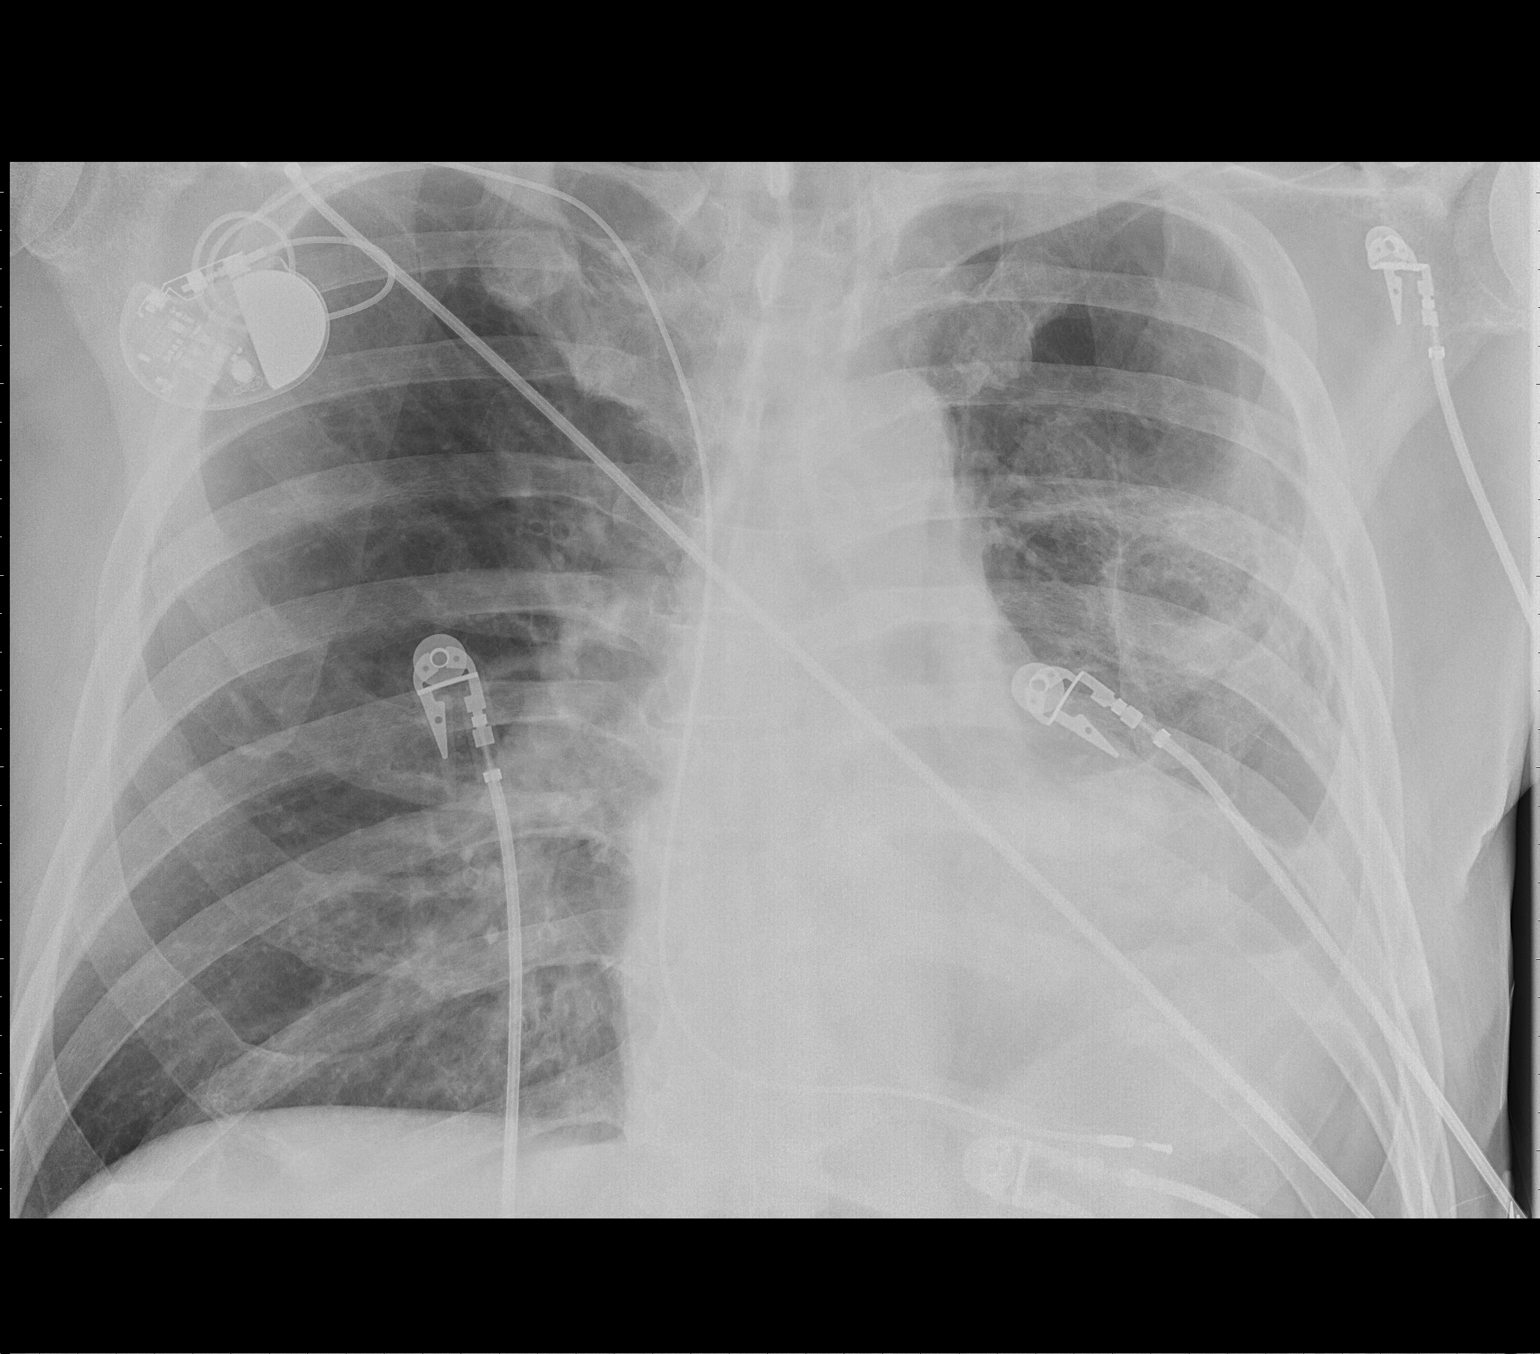

[1 of 1 positions shown; findings below may reference images not displayed]

FINDINGS: There are chronic left lung changes with scarring and chronic pleural thickening/pleural effusion.  The pacer wire is stable. The heart remains mildly enlarged. The right lung is grossly clear.
IMPRESSION: 1.  Chronic changes in the left lung with scarring and chronic pleural thickening.
 2.  No acute pulmonary findings.

## 2008-07-17 IMAGING — CR DG CHEST 2V
2 series · 2 of 2 positions shown · non-contrast
Comparison: 03/04/2007 and 04/23/2007

CLINICAL DATA: History of atrial fibrillation. 
 CHEST ? 2 VIEW:

[w chest pa]
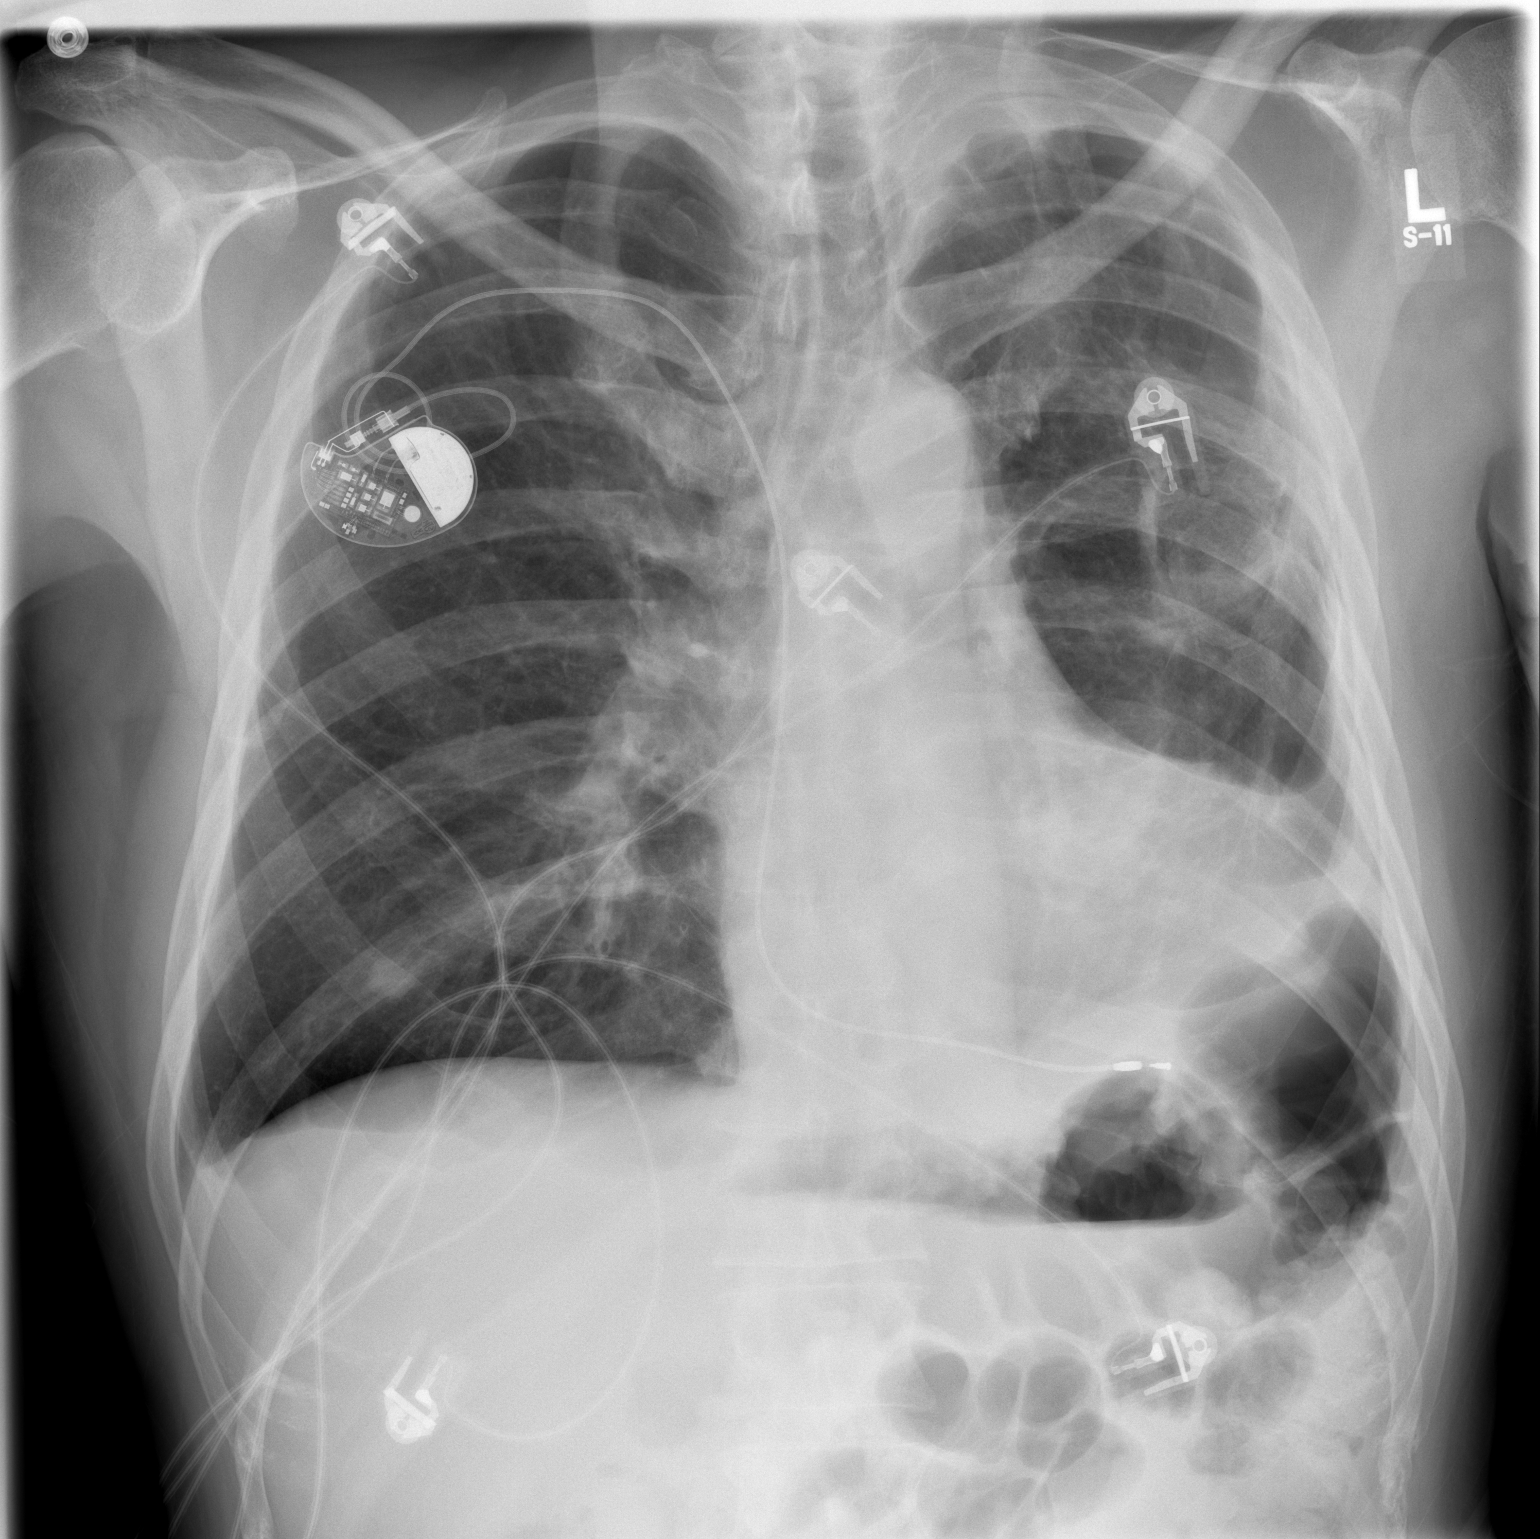

[w chest lat]
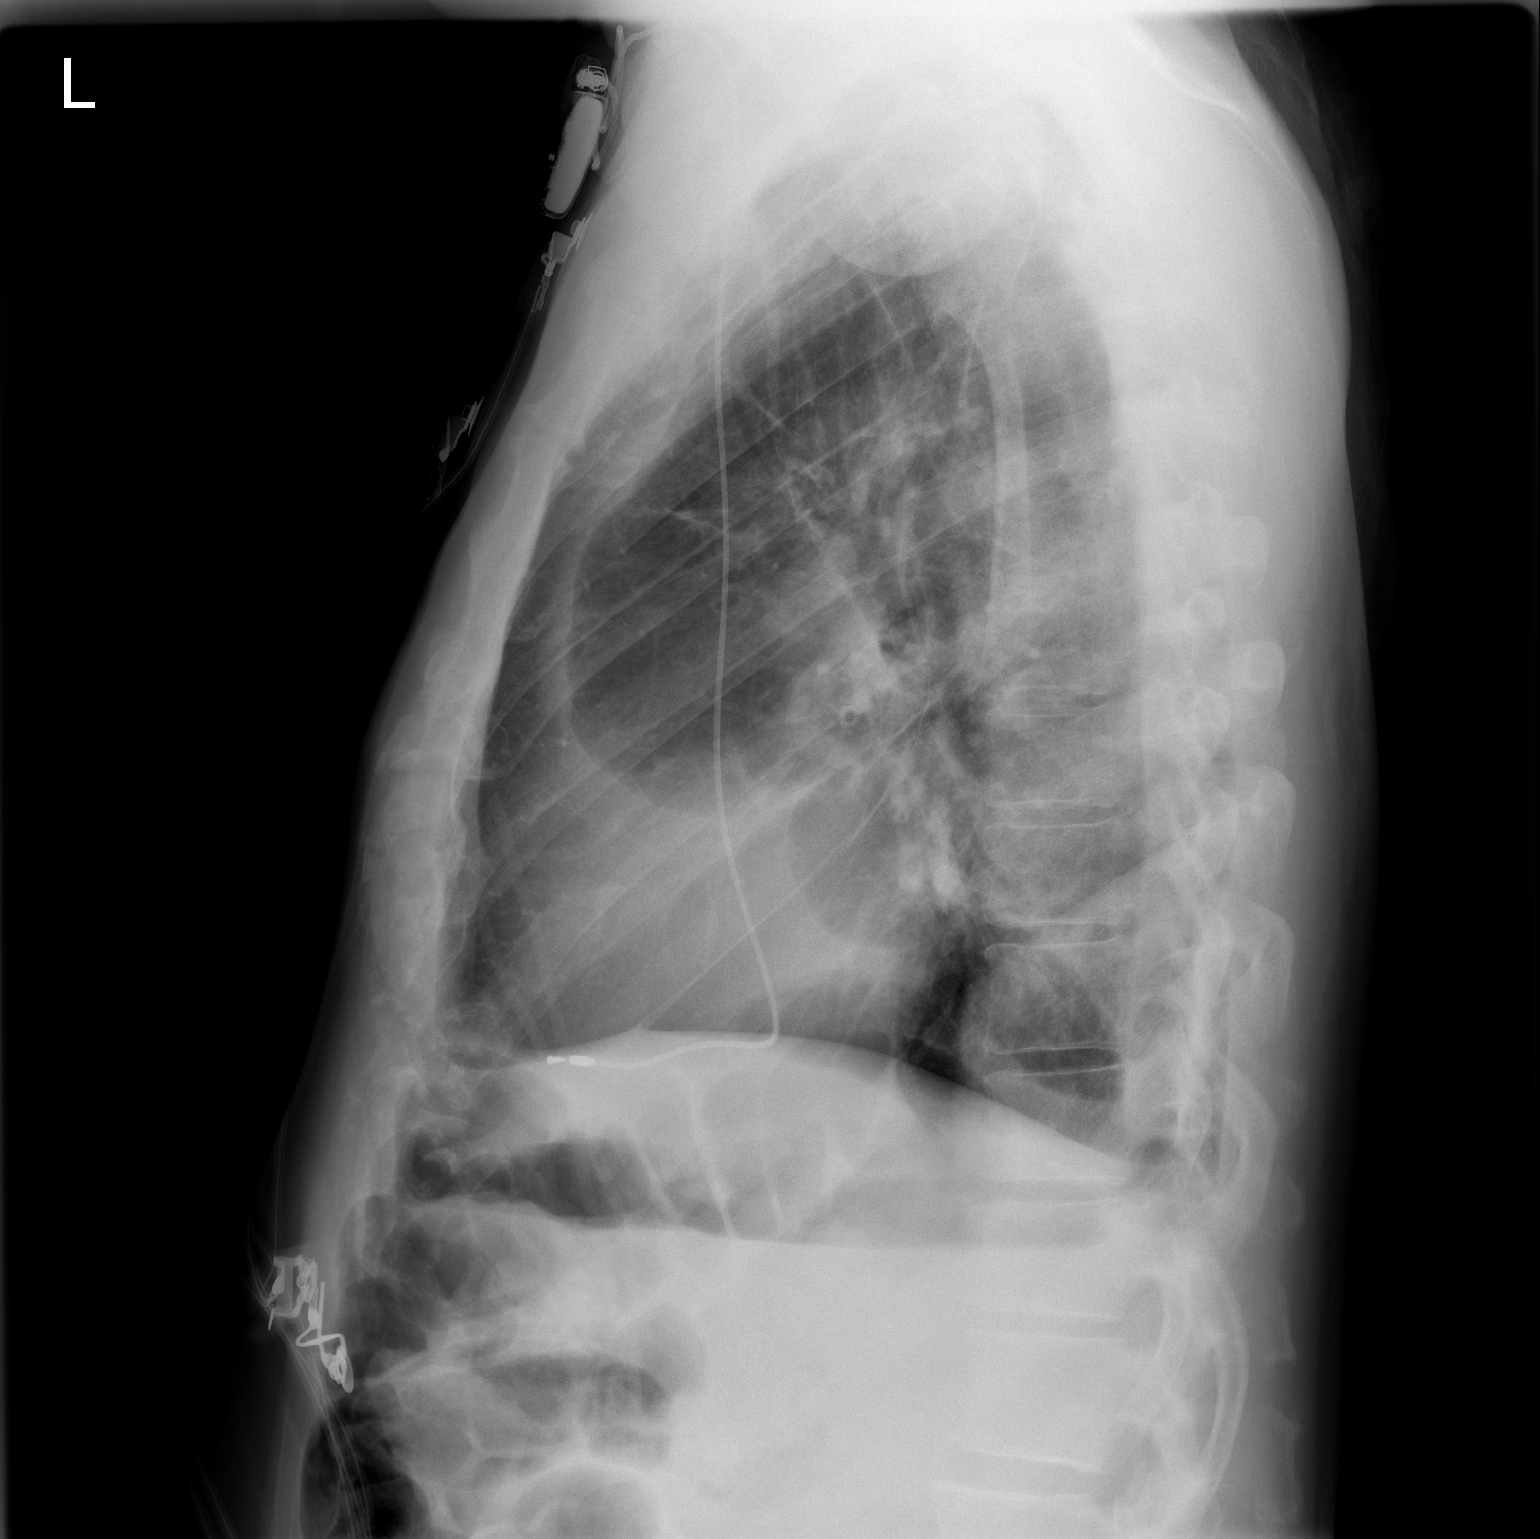

[2 of 2 positions shown; findings below may reference images not displayed]

FINDINGS: Pacer is in stable and satisfactory position.  Coarse interstitial densities bilaterally.  There is volume loss left lung base. Nodular density right lower lung stable.
IMPRESSION: No acute changes.

## 2008-07-21 ENCOUNTER — Encounter: Payer: Self-pay | Admitting: Internal Medicine

## 2008-07-26 ENCOUNTER — Encounter: Payer: Self-pay | Admitting: *Deleted

## 2008-07-26 ENCOUNTER — Inpatient Hospital Stay (HOSPITAL_COMMUNITY): Admission: EM | Admit: 2008-07-26 | Discharge: 2008-07-31 | Payer: Self-pay | Admitting: Emergency Medicine

## 2008-07-26 ENCOUNTER — Ambulatory Visit: Payer: Self-pay | Admitting: Internal Medicine

## 2008-07-27 ENCOUNTER — Encounter: Payer: Self-pay | Admitting: *Deleted

## 2008-07-27 ENCOUNTER — Ambulatory Visit: Payer: Self-pay | Admitting: Internal Medicine

## 2008-07-27 LAB — CONVERTED CEMR LAB: Cholesterol: 102 mg/dL

## 2008-07-29 ENCOUNTER — Encounter (INDEPENDENT_AMBULATORY_CARE_PROVIDER_SITE_OTHER): Payer: Self-pay | Admitting: Internal Medicine

## 2008-07-30 ENCOUNTER — Encounter: Payer: Self-pay | Admitting: Internal Medicine

## 2008-08-08 ENCOUNTER — Ambulatory Visit: Payer: Self-pay | Admitting: Internal Medicine

## 2008-08-08 ENCOUNTER — Encounter (INDEPENDENT_AMBULATORY_CARE_PROVIDER_SITE_OTHER): Payer: Self-pay | Admitting: Internal Medicine

## 2008-08-08 LAB — CONVERTED CEMR LAB: INR: 1.9

## 2008-08-09 LAB — CONVERTED CEMR LAB
ALT: <8 units/L (ref 0–53)
AST: 13 units/L (ref 0–37)
Albumin: 3.9 g/dL (ref 3.5–5.2)
CO2: 23 meq/L (ref 19–32)
Calcium: 9.8 mg/dL (ref 8.4–10.5)
Chloride: 111 meq/L (ref 96–112)
Eosinophils Absolute: 0.4 10*3/uL (ref 0.0–0.7)
Lymphocytes Relative: 32 % (ref 12–46)
Lymphs Abs: 2.5 10*3/uL (ref 0.7–4.0)
MCV: 90.2 fL (ref 78.0–100.0)
Magnesium: 2 mg/dL (ref 1.5–2.5)
Monocytes Relative: 7 % (ref 3–12)
Neutro Abs: 4.3 10*3/uL (ref 1.7–7.7)
Neutrophils Relative %: 56 % (ref 43–77)
Potassium: 4.6 meq/L (ref 3.5–5.3)
RBC: 4.6 M/uL (ref 4.22–5.81)
Sodium: 143 meq/L (ref 135–145)
Total Protein: 6.6 g/dL (ref 6.0–8.3)
WBC: 7.8 10*3/uL (ref 4.0–10.5)

## 2008-08-15 ENCOUNTER — Encounter (INDEPENDENT_AMBULATORY_CARE_PROVIDER_SITE_OTHER): Payer: Self-pay | Admitting: *Deleted

## 2008-08-15 ENCOUNTER — Ambulatory Visit: Payer: Self-pay | Admitting: Internal Medicine

## 2008-08-15 ENCOUNTER — Inpatient Hospital Stay (HOSPITAL_COMMUNITY): Admission: EM | Admit: 2008-08-15 | Discharge: 2008-08-25 | Payer: Self-pay | Admitting: Emergency Medicine

## 2008-08-17 ENCOUNTER — Encounter (INDEPENDENT_AMBULATORY_CARE_PROVIDER_SITE_OTHER): Payer: Self-pay | Admitting: Internal Medicine

## 2008-08-23 IMAGING — CR DG CHEST 1V PORT
1 series · 1 of 1 positions shown · non-contrast
Comparison: 04/25/07.

CLINICAL DATA: Weakness, nausea, chest pain, and shortness of breath.
 CHEST PORTABLE - 1 VIEW ? 06/01/07:

[view not recorded]
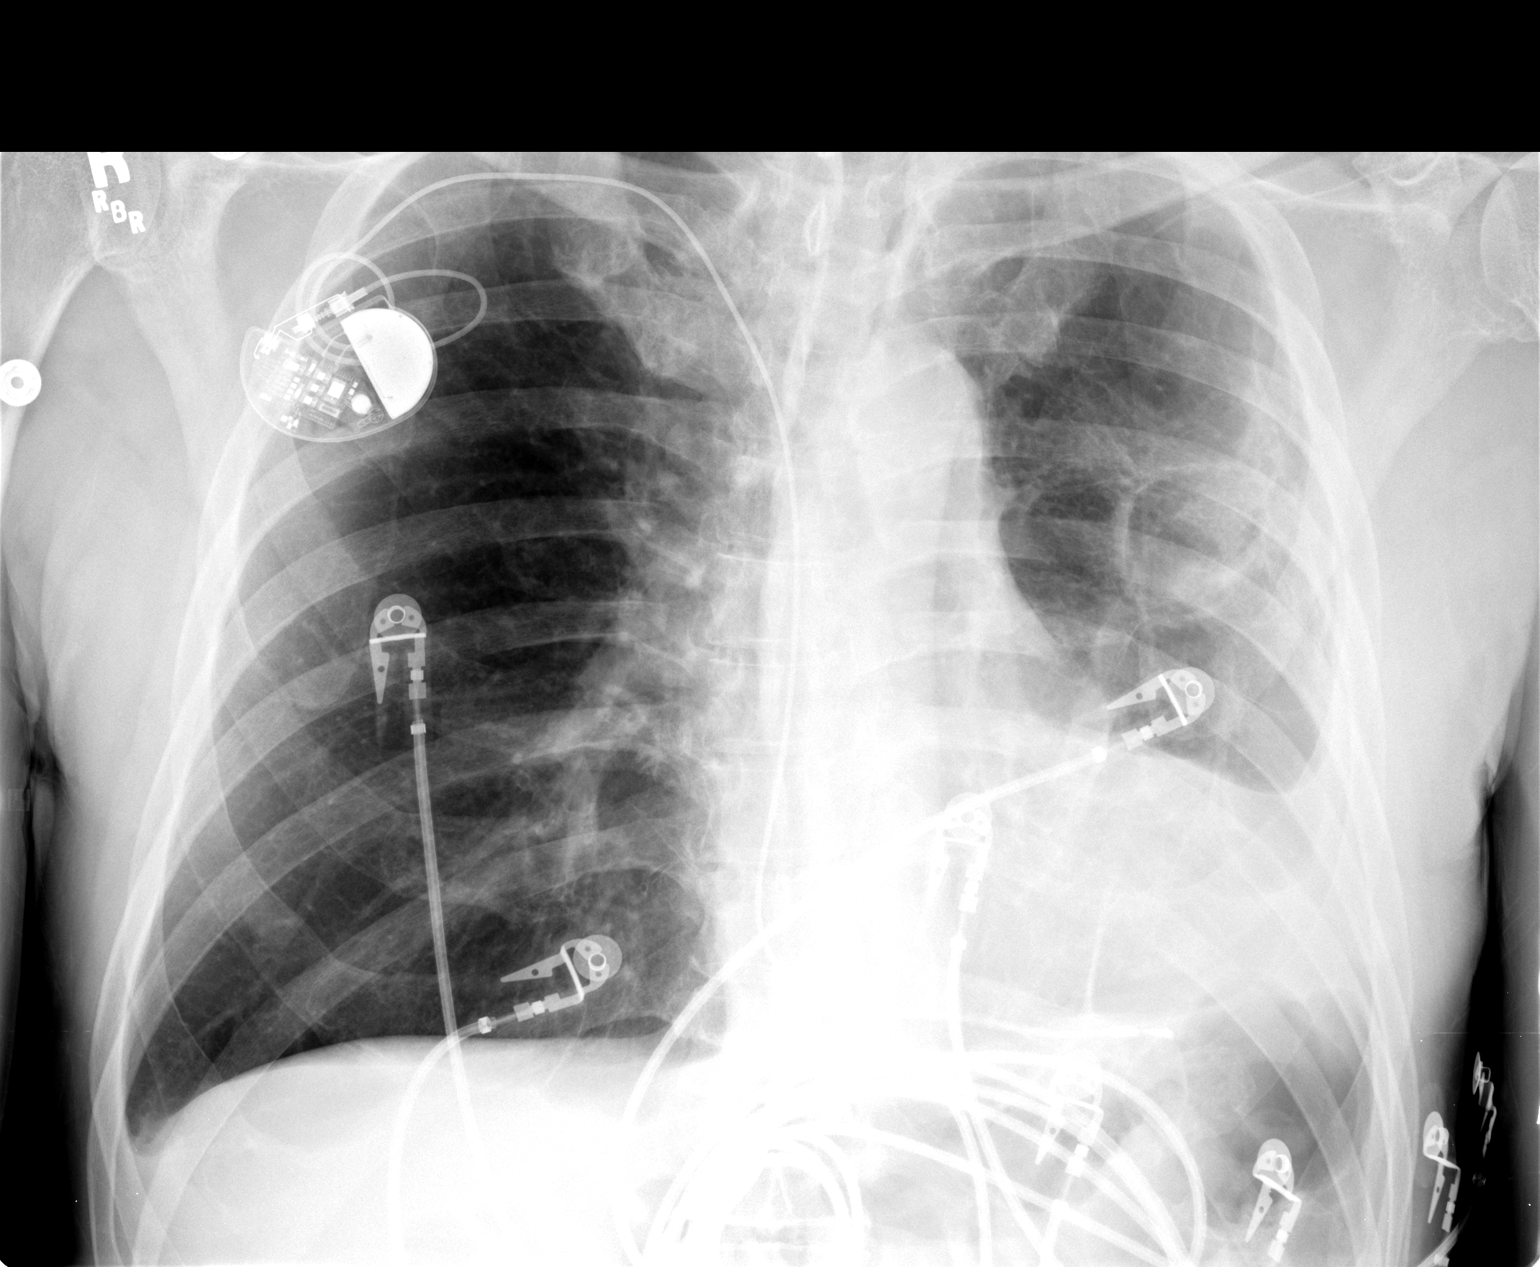

[1 of 1 positions shown; findings below may reference images not displayed]

FINDINGS: The heart size is normal.
 There are small bilateral pleural effusions which are new when compared with the previous exam.
 There is volume loss and extensive scarring involving the left lung similar to the previous study.
 Calcified left-sided pleural plaques are once again noted.
IMPRESSION: 1.  New small bilateral pleural effusions.
 2.  Stable left lung scarring, volume loss, and calcified pleural plaques consistent with prior asbestos exposure.

## 2008-08-24 ENCOUNTER — Telehealth (INDEPENDENT_AMBULATORY_CARE_PROVIDER_SITE_OTHER): Payer: Self-pay | Admitting: Pharmacy Technician

## 2008-08-24 ENCOUNTER — Encounter (INDEPENDENT_AMBULATORY_CARE_PROVIDER_SITE_OTHER): Payer: Self-pay | Admitting: Internal Medicine

## 2008-08-25 ENCOUNTER — Encounter: Payer: Self-pay | Admitting: Internal Medicine

## 2008-09-01 ENCOUNTER — Telehealth: Payer: Self-pay | Admitting: Internal Medicine

## 2008-09-01 ENCOUNTER — Ambulatory Visit: Payer: Self-pay | Admitting: Internal Medicine

## 2008-09-01 ENCOUNTER — Encounter: Payer: Self-pay | Admitting: Internal Medicine

## 2008-09-01 DIAGNOSIS — R5383 Other fatigue: Secondary | ICD-10-CM

## 2008-09-01 DIAGNOSIS — R5381 Other malaise: Secondary | ICD-10-CM | POA: Insufficient documentation

## 2008-09-01 LAB — CONVERTED CEMR LAB
BUN: 6 mg/dL (ref 6–23)
Chloride: 110 meq/L (ref 96–112)
Creatinine, Ser: 1.38 mg/dL (ref 0.40–1.50)
Eosinophils Absolute: 0.8 10*3/uL — ABNORMAL HIGH (ref 0.0–0.7)
Eosinophils Relative: 10 % — ABNORMAL HIGH (ref 0–5)
Glucose, Bld: 87 mg/dL (ref 70–99)
HCT: 33.6 % — ABNORMAL LOW (ref 39.0–52.0)
Hemoglobin: 10.4 g/dL — ABNORMAL LOW (ref 13.0–17.0)
Lymphocytes Relative: 31 % (ref 12–46)
Lymphs Abs: 2.3 10*3/uL (ref 0.7–4.0)
MCV: 89.4 fL (ref 78.0–100.0)
Monocytes Absolute: 0.6 10*3/uL (ref 0.1–1.0)
Nitrite: NEGATIVE
Platelets: 371 10*3/uL (ref 150–400)
Potassium: 4 meq/L (ref 3.5–5.3)
Protein, U semiquant: 100
RDW: 16.7 % — ABNORMAL HIGH (ref 11.5–15.5)
Specific Gravity, Urine: 1.025

## 2008-09-02 ENCOUNTER — Telehealth (INDEPENDENT_AMBULATORY_CARE_PROVIDER_SITE_OTHER): Payer: Self-pay | Admitting: Internal Medicine

## 2008-09-05 ENCOUNTER — Encounter (INDEPENDENT_AMBULATORY_CARE_PROVIDER_SITE_OTHER): Payer: Self-pay | Admitting: Internal Medicine

## 2008-09-06 ENCOUNTER — Encounter (INDEPENDENT_AMBULATORY_CARE_PROVIDER_SITE_OTHER): Payer: Self-pay | Admitting: Internal Medicine

## 2008-09-09 ENCOUNTER — Encounter: Payer: Self-pay | Admitting: Internal Medicine

## 2008-09-09 ENCOUNTER — Ambulatory Visit: Payer: Self-pay | Admitting: Internal Medicine

## 2008-09-13 ENCOUNTER — Encounter (INDEPENDENT_AMBULATORY_CARE_PROVIDER_SITE_OTHER): Payer: Self-pay | Admitting: Internal Medicine

## 2008-09-13 ENCOUNTER — Ambulatory Visit: Payer: Self-pay | Admitting: Internal Medicine

## 2008-09-13 DIAGNOSIS — Z87448 Personal history of other diseases of urinary system: Secondary | ICD-10-CM

## 2008-09-16 ENCOUNTER — Encounter: Payer: Self-pay | Admitting: Licensed Clinical Social Worker

## 2008-09-19 ENCOUNTER — Encounter (INDEPENDENT_AMBULATORY_CARE_PROVIDER_SITE_OTHER): Payer: Self-pay | Admitting: Internal Medicine

## 2008-09-20 ENCOUNTER — Telehealth: Payer: Self-pay | Admitting: *Deleted

## 2008-09-23 ENCOUNTER — Emergency Department (HOSPITAL_COMMUNITY): Admission: EM | Admit: 2008-09-23 | Discharge: 2008-09-24 | Payer: Self-pay | Admitting: Emergency Medicine

## 2008-09-23 ENCOUNTER — Telehealth (INDEPENDENT_AMBULATORY_CARE_PROVIDER_SITE_OTHER): Payer: Self-pay | Admitting: Pharmacy Technician

## 2008-09-23 ENCOUNTER — Telehealth: Payer: Self-pay | Admitting: Internal Medicine

## 2008-09-25 ENCOUNTER — Encounter: Payer: Self-pay | Admitting: Internal Medicine

## 2008-09-25 ENCOUNTER — Inpatient Hospital Stay (HOSPITAL_COMMUNITY): Admission: EM | Admit: 2008-09-25 | Discharge: 2008-09-27 | Payer: Self-pay | Admitting: Emergency Medicine

## 2008-09-25 ENCOUNTER — Ambulatory Visit: Payer: Self-pay | Admitting: Internal Medicine

## 2008-09-27 ENCOUNTER — Encounter: Payer: Self-pay | Admitting: Licensed Clinical Social Worker

## 2008-09-27 ENCOUNTER — Encounter: Payer: Self-pay | Admitting: Internal Medicine

## 2008-10-13 ENCOUNTER — Encounter (INDEPENDENT_AMBULATORY_CARE_PROVIDER_SITE_OTHER): Payer: Self-pay | Admitting: Internal Medicine

## 2008-10-13 ENCOUNTER — Ambulatory Visit: Payer: Self-pay | Admitting: Internal Medicine

## 2008-10-13 LAB — CONVERTED CEMR LAB
BUN: 7 mg/dL (ref 6–23)
Basophils Absolute: 0 10*3/uL (ref 0.0–0.1)
Basophils Relative: 0 % (ref 0–1)
CO2: 20 meq/L (ref 19–32)
Calcium: 8.8 mg/dL (ref 8.4–10.5)
Chloride: 112 meq/L (ref 96–112)
Creatinine, Ser: 1.47 mg/dL (ref 0.40–1.50)
Eosinophils Absolute: 0.2 10*3/uL (ref 0.0–0.7)
Eosinophils Relative: 4 % (ref 0–5)
Glucose, Bld: 86 mg/dL (ref 70–99)
HCT: 38.4 % — ABNORMAL LOW (ref 39.0–52.0)
Lymphs Abs: 1.8 10*3/uL (ref 0.7–4.0)
MCHC: 32.3 g/dL (ref 30.0–36.0)
MCV: 88.1 fL (ref 78.0–100.0)
Neutrophils Relative %: 55 % (ref 43–77)
Platelets: 249 10*3/uL (ref 150–400)
RDW: 17.1 % — ABNORMAL HIGH (ref 11.5–15.5)

## 2008-10-18 ENCOUNTER — Encounter: Payer: Self-pay | Admitting: *Deleted

## 2008-10-24 ENCOUNTER — Ambulatory Visit: Payer: Self-pay | Admitting: Internal Medicine

## 2008-11-09 ENCOUNTER — Encounter (INDEPENDENT_AMBULATORY_CARE_PROVIDER_SITE_OTHER): Payer: Self-pay | Admitting: Internal Medicine

## 2008-11-16 ENCOUNTER — Encounter: Payer: Self-pay | Admitting: Internal Medicine

## 2008-11-17 ENCOUNTER — Encounter (INDEPENDENT_AMBULATORY_CARE_PROVIDER_SITE_OTHER): Payer: Self-pay | Admitting: Internal Medicine

## 2008-11-17 ENCOUNTER — Inpatient Hospital Stay (HOSPITAL_COMMUNITY): Admission: EM | Admit: 2008-11-17 | Discharge: 2008-11-17 | Payer: Self-pay | Admitting: Emergency Medicine

## 2008-11-17 ENCOUNTER — Ambulatory Visit: Payer: Self-pay | Admitting: Internal Medicine

## 2008-11-22 ENCOUNTER — Encounter (INDEPENDENT_AMBULATORY_CARE_PROVIDER_SITE_OTHER): Payer: Self-pay | Admitting: Internal Medicine

## 2008-11-22 ENCOUNTER — Encounter: Payer: Self-pay | Admitting: Internal Medicine

## 2008-11-22 ENCOUNTER — Inpatient Hospital Stay (HOSPITAL_COMMUNITY): Admission: AD | Admit: 2008-11-22 | Discharge: 2008-11-28 | Payer: Self-pay | Admitting: Internal Medicine

## 2008-11-22 ENCOUNTER — Ambulatory Visit: Payer: Self-pay | Admitting: Internal Medicine

## 2008-11-22 LAB — CONVERTED CEMR LAB
BUN: 30 mg/dL — ABNORMAL HIGH (ref 6–23)
CO2: 23 meq/L (ref 19–32)
Calcium: 9.1 mg/dL (ref 8.4–10.5)
Glucose, Bld: 95 mg/dL (ref 70–99)
HCT: 44.3 % (ref 39.0–52.0)
Platelets: 169 10*3/uL (ref 150–400)
RDW: 18.3 % — ABNORMAL HIGH (ref 11.5–15.5)
Sodium: 143 meq/L (ref 135–145)

## 2008-11-23 ENCOUNTER — Encounter (INDEPENDENT_AMBULATORY_CARE_PROVIDER_SITE_OTHER): Payer: Self-pay | Admitting: Internal Medicine

## 2008-11-23 ENCOUNTER — Encounter: Payer: Self-pay | Admitting: *Deleted

## 2008-11-24 ENCOUNTER — Telehealth: Payer: Self-pay | Admitting: *Deleted

## 2008-11-28 ENCOUNTER — Encounter (INDEPENDENT_AMBULATORY_CARE_PROVIDER_SITE_OTHER): Payer: Self-pay | Admitting: Internal Medicine

## 2008-12-07 ENCOUNTER — Encounter (INDEPENDENT_AMBULATORY_CARE_PROVIDER_SITE_OTHER): Payer: Self-pay | Admitting: Internal Medicine

## 2008-12-07 ENCOUNTER — Encounter: Payer: Self-pay | Admitting: Pharmacist

## 2008-12-07 ENCOUNTER — Ambulatory Visit: Payer: Self-pay | Admitting: Infectious Diseases

## 2008-12-07 DIAGNOSIS — J984 Other disorders of lung: Secondary | ICD-10-CM

## 2008-12-07 LAB — CONVERTED CEMR LAB
CO2: 30 meq/L (ref 19–32)
Calcium: 9.1 mg/dL (ref 8.4–10.5)
Creatinine, Ser: 1.41 mg/dL (ref 0.40–1.50)
Glucose, Bld: 106 mg/dL — ABNORMAL HIGH (ref 70–99)
Hemoglobin: 13.2 g/dL (ref 13.0–17.0)
INR: 7.7
INR: 4.9 — ABNORMAL HIGH (ref 0.0–1.5)
INR: 7.7
Platelets: 206 10*3/uL (ref 150–400)
RDW: 19 % — ABNORMAL HIGH (ref 11.5–15.5)

## 2008-12-08 ENCOUNTER — Encounter: Payer: Self-pay | Admitting: *Deleted

## 2008-12-12 ENCOUNTER — Ambulatory Visit: Payer: Self-pay | Admitting: Internal Medicine

## 2008-12-12 ENCOUNTER — Inpatient Hospital Stay (HOSPITAL_COMMUNITY): Admission: EM | Admit: 2008-12-12 | Discharge: 2008-12-19 | Payer: Self-pay | Admitting: Emergency Medicine

## 2008-12-12 ENCOUNTER — Encounter (INDEPENDENT_AMBULATORY_CARE_PROVIDER_SITE_OTHER): Payer: Self-pay | Admitting: Internal Medicine

## 2008-12-13 ENCOUNTER — Encounter: Payer: Self-pay | Admitting: Internal Medicine

## 2008-12-19 ENCOUNTER — Encounter: Payer: Self-pay | Admitting: Internal Medicine

## 2008-12-20 ENCOUNTER — Telehealth: Payer: Self-pay | Admitting: Internal Medicine

## 2008-12-21 ENCOUNTER — Telehealth (INDEPENDENT_AMBULATORY_CARE_PROVIDER_SITE_OTHER): Payer: Self-pay | Admitting: Internal Medicine

## 2008-12-21 ENCOUNTER — Encounter: Payer: Self-pay | Admitting: Internal Medicine

## 2008-12-21 ENCOUNTER — Encounter: Payer: Self-pay | Admitting: Emergency Medicine

## 2008-12-21 ENCOUNTER — Telehealth: Payer: Self-pay | Admitting: *Deleted

## 2008-12-21 IMAGING — CR DG HIP COMPLETE 2+V*R*
3 series · 3 of 3 positions shown · non-contrast
Comparison: 09/04/2006

CLINICAL DATA: Fall, hip pain

RIGHT HIP - COMPLETE 2+ VIEW

[t pelvis a.p.]
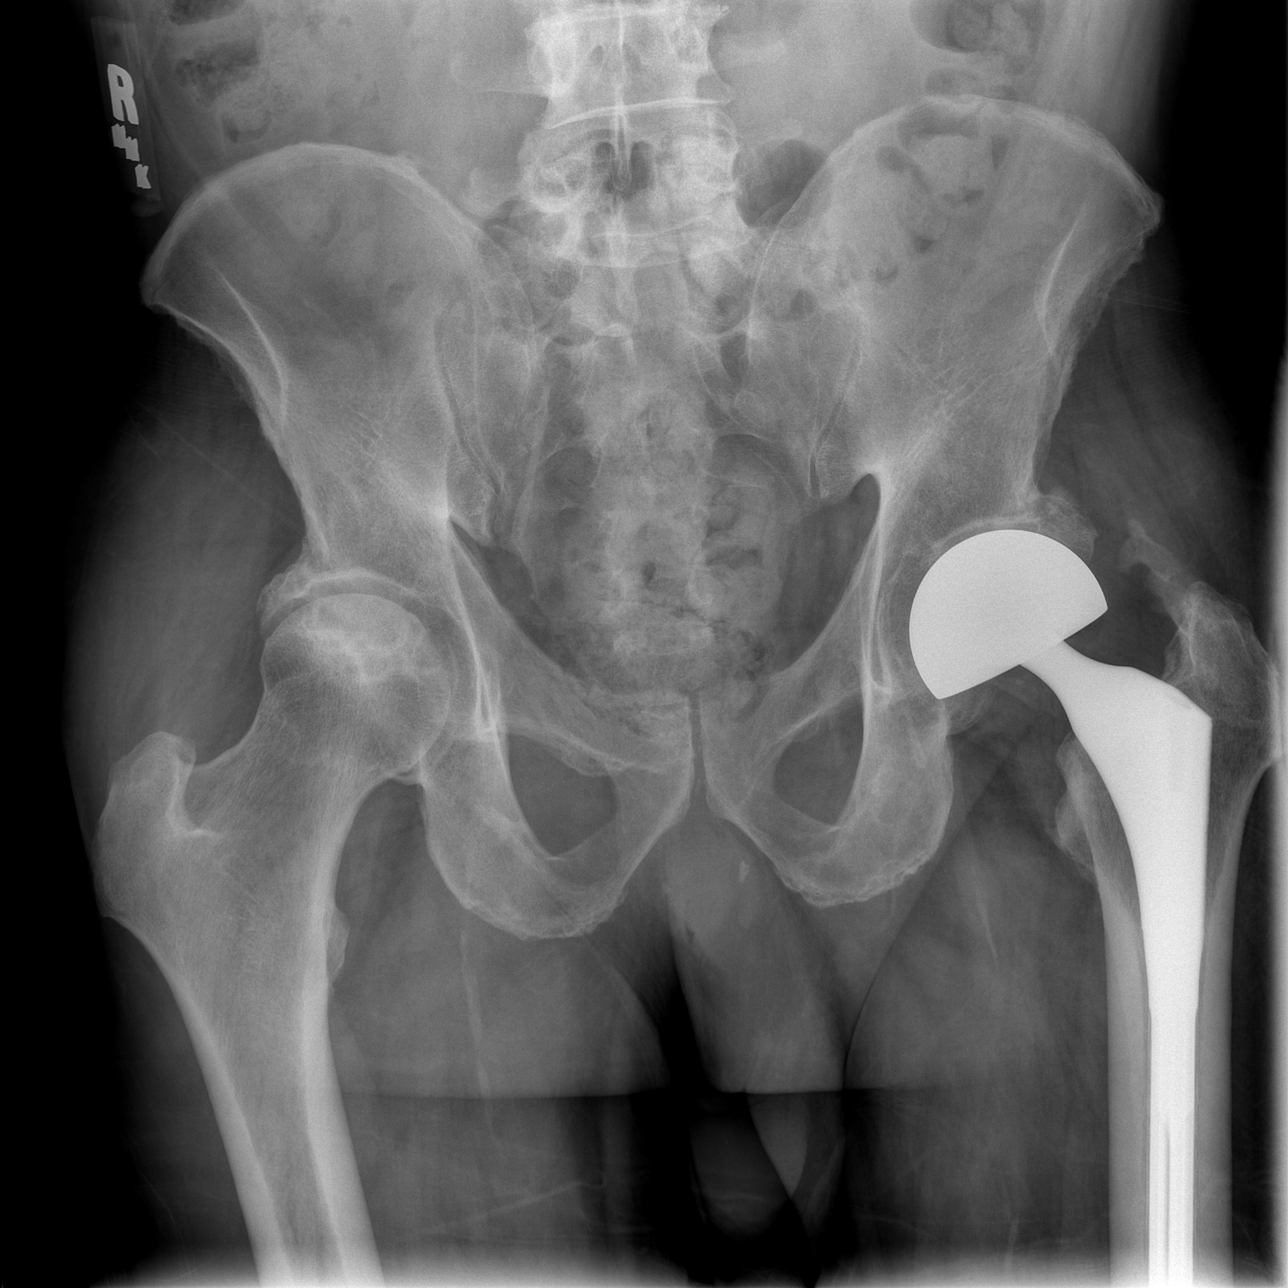

[t hip ap right]
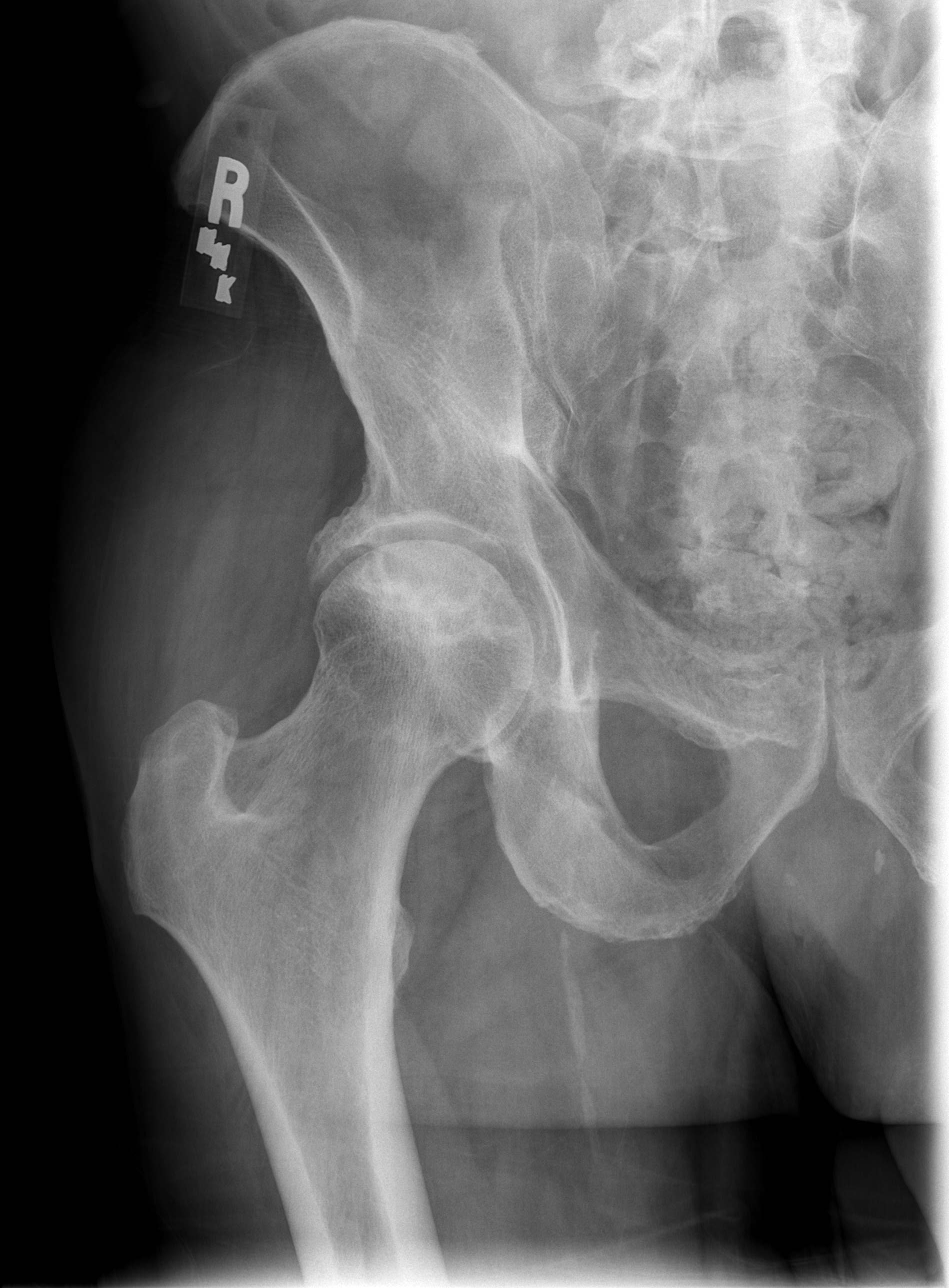

[t hip frog leg right]
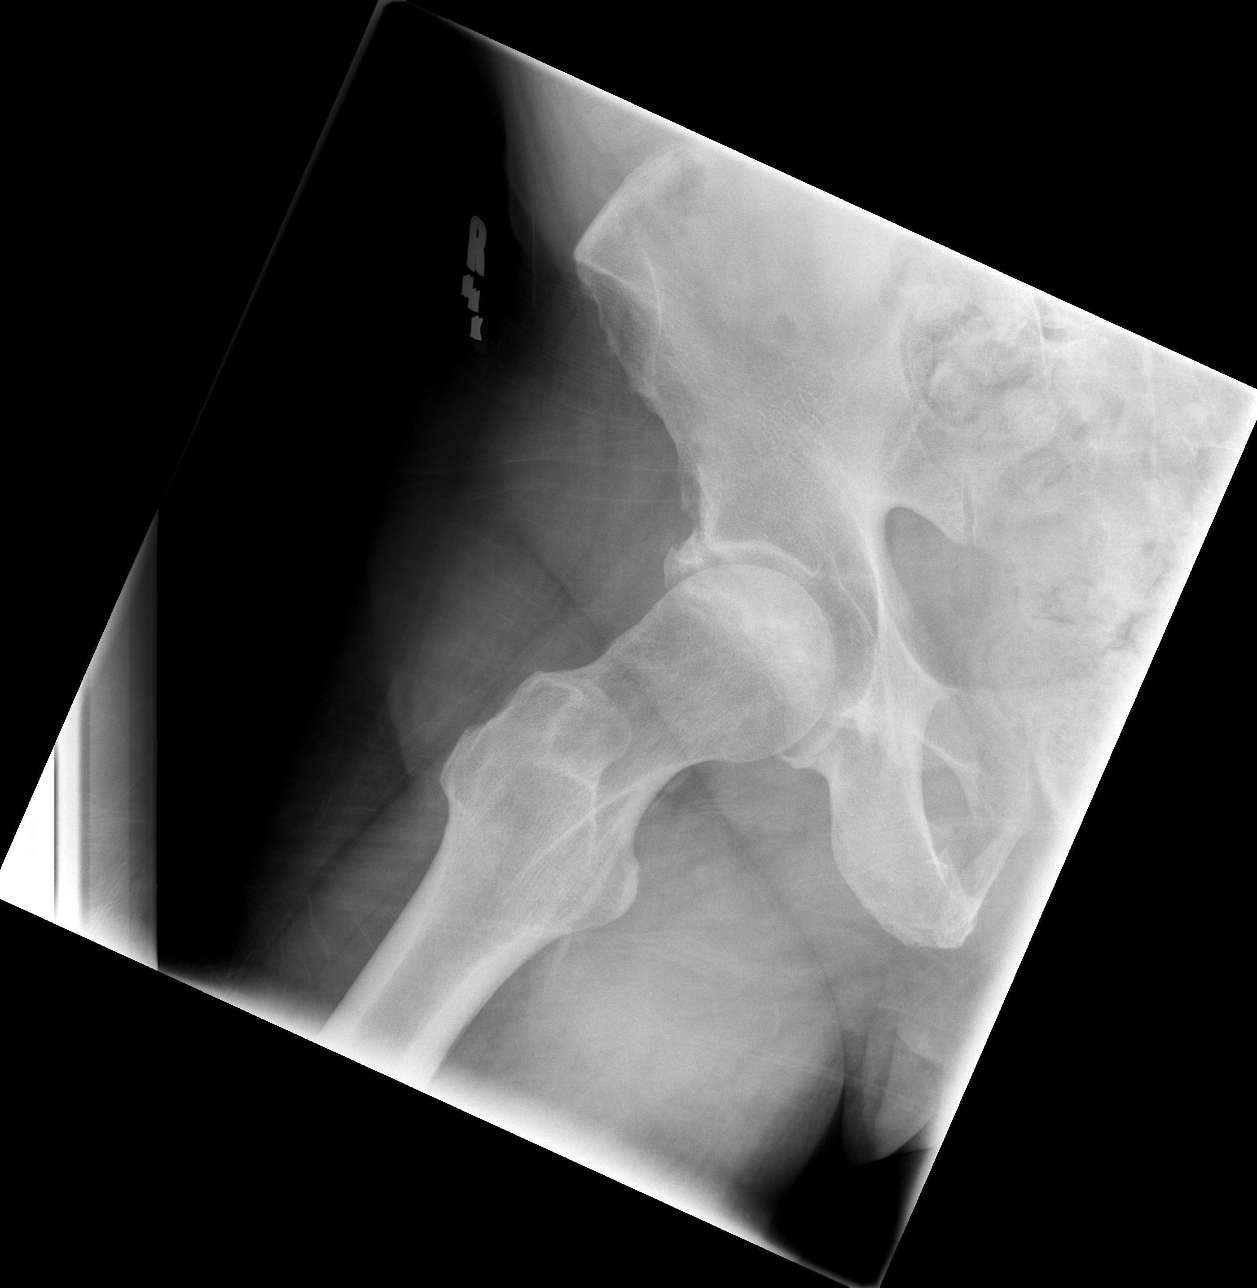

[3 of 3 positions shown; findings below may reference images not displayed]

FINDINGS: The patient is status post left hip replacement.
Advanced degenerative changes within the right hip.  This is
similar prior study.  No acute bony abnormality.  No fracture,
subluxation, or dislocation.
IMPRESSION: Advanced degenerative changes in the right hip.  No acute findings.

Prior left hip replacement.

## 2008-12-22 ENCOUNTER — Encounter: Payer: Self-pay | Admitting: Internal Medicine

## 2008-12-22 ENCOUNTER — Ambulatory Visit: Payer: Self-pay | Admitting: Infectious Disease

## 2008-12-22 ENCOUNTER — Inpatient Hospital Stay (HOSPITAL_COMMUNITY): Admission: EM | Admit: 2008-12-22 | Discharge: 2008-12-30 | Payer: Self-pay | Admitting: Infectious Disease

## 2008-12-22 ENCOUNTER — Ambulatory Visit: Payer: Self-pay | Admitting: Internal Medicine

## 2008-12-22 LAB — CONVERTED CEMR LAB: TSH: 3.621 microintl units/mL

## 2008-12-29 ENCOUNTER — Encounter (INDEPENDENT_AMBULATORY_CARE_PROVIDER_SITE_OTHER): Payer: Self-pay | Admitting: Internal Medicine

## 2008-12-30 ENCOUNTER — Encounter: Payer: Self-pay | Admitting: Internal Medicine

## 2009-01-03 ENCOUNTER — Ambulatory Visit: Payer: Self-pay | Admitting: Internal Medicine

## 2009-01-04 ENCOUNTER — Encounter (INDEPENDENT_AMBULATORY_CARE_PROVIDER_SITE_OTHER): Payer: Self-pay | Admitting: Internal Medicine

## 2009-01-09 ENCOUNTER — Ambulatory Visit: Payer: Self-pay | Admitting: Internal Medicine

## 2009-01-09 DIAGNOSIS — Z8719 Personal history of other diseases of the digestive system: Secondary | ICD-10-CM

## 2009-01-11 ENCOUNTER — Encounter (INDEPENDENT_AMBULATORY_CARE_PROVIDER_SITE_OTHER): Payer: Self-pay | Admitting: Internal Medicine

## 2009-01-11 ENCOUNTER — Ambulatory Visit: Payer: Self-pay | Admitting: Internal Medicine

## 2009-01-12 ENCOUNTER — Encounter (INDEPENDENT_AMBULATORY_CARE_PROVIDER_SITE_OTHER): Payer: Self-pay | Admitting: Internal Medicine

## 2009-01-24 ENCOUNTER — Telehealth (INDEPENDENT_AMBULATORY_CARE_PROVIDER_SITE_OTHER): Payer: Self-pay | Admitting: Internal Medicine

## 2009-01-25 ENCOUNTER — Encounter: Payer: Self-pay | Admitting: Infectious Diseases

## 2009-01-27 ENCOUNTER — Telehealth: Payer: Self-pay | Admitting: Internal Medicine

## 2009-02-06 ENCOUNTER — Encounter (INDEPENDENT_AMBULATORY_CARE_PROVIDER_SITE_OTHER): Payer: Self-pay | Admitting: Internal Medicine

## 2009-02-07 ENCOUNTER — Encounter (INDEPENDENT_AMBULATORY_CARE_PROVIDER_SITE_OTHER): Payer: Self-pay | Admitting: Internal Medicine

## 2009-02-13 ENCOUNTER — Encounter: Admission: RE | Admit: 2009-02-13 | Discharge: 2009-02-13 | Payer: Self-pay | Admitting: Orthopedic Surgery

## 2009-02-24 ENCOUNTER — Telehealth (INDEPENDENT_AMBULATORY_CARE_PROVIDER_SITE_OTHER): Payer: Self-pay | Admitting: Internal Medicine

## 2009-02-24 ENCOUNTER — Encounter (INDEPENDENT_AMBULATORY_CARE_PROVIDER_SITE_OTHER): Payer: Self-pay | Admitting: Internal Medicine

## 2009-03-06 ENCOUNTER — Encounter (INDEPENDENT_AMBULATORY_CARE_PROVIDER_SITE_OTHER): Payer: Self-pay | Admitting: Internal Medicine

## 2009-03-06 ENCOUNTER — Ambulatory Visit: Payer: Self-pay | Admitting: Internal Medicine

## 2009-03-07 ENCOUNTER — Encounter (INDEPENDENT_AMBULATORY_CARE_PROVIDER_SITE_OTHER): Payer: Self-pay | Admitting: Internal Medicine

## 2009-03-07 LAB — CONVERTED CEMR LAB
Basophils Relative: 0 % (ref 0–1)
CO2: 25 meq/L (ref 19–32)
Creatinine, Ser: 1.64 mg/dL — ABNORMAL HIGH (ref 0.40–1.50)
Eosinophils Absolute: 0.4 10*3/uL (ref 0.0–0.7)
Eosinophils Relative: 5 % (ref 0–5)
Glucose, Bld: 85 mg/dL (ref 70–99)
HCT: 41.9 % (ref 39.0–52.0)
Hemoglobin, Urine: NEGATIVE
Hemoglobin: 13.4 g/dL (ref 13.0–17.0)
Leukocytes, UA: NEGATIVE
MCHC: 32 g/dL (ref 30.0–36.0)
MCV: 93.7 fL (ref >=78.0)
Monocytes Absolute: 0.7 10*3/uL (ref 0.1–1.0)
Monocytes Relative: 10 % (ref 3–12)
Nitrite: NEGATIVE
RBC / HPF: NONE SEEN (ref <3)
RBC: 4.47 M/uL (ref 4.22–5.81)
Sodium: 147 meq/L — ABNORMAL HIGH (ref 135–145)
TSH: 0.097 microintl units/mL — ABNORMAL LOW (ref 0.350–4.5)
Total Bilirubin: 0.8 mg/dL (ref 0.3–1.2)
Total Protein: 6.5 g/dL (ref 6.0–8.3)
pH: 5.5 (ref 5.0–8.0)

## 2009-03-08 ENCOUNTER — Telehealth (INDEPENDENT_AMBULATORY_CARE_PROVIDER_SITE_OTHER): Payer: Self-pay | Admitting: Internal Medicine

## 2009-03-13 ENCOUNTER — Ambulatory Visit: Payer: Self-pay | Admitting: Internal Medicine

## 2009-03-13 ENCOUNTER — Encounter (INDEPENDENT_AMBULATORY_CARE_PROVIDER_SITE_OTHER): Payer: Self-pay | Admitting: Internal Medicine

## 2009-03-13 DIAGNOSIS — Z8639 Personal history of other endocrine, nutritional and metabolic disease: Secondary | ICD-10-CM

## 2009-03-13 LAB — CONVERTED CEMR LAB
CO2: 17 meq/L — ABNORMAL LOW (ref 19–32)
Calcium: 9.3 mg/dL (ref 8.4–10.5)
Chloride: 114 meq/L — ABNORMAL HIGH (ref 96–112)
Glucose, Bld: 84 mg/dL (ref 70–99)
Potassium: 4.2 meq/L (ref 3.5–5.3)
RBC / HPF: NONE SEEN (ref <3)
Sodium: 144 meq/L (ref 135–145)
Specific Gravity, Urine: 1.025 (ref 1.005–1.0)
Urine Glucose: NEGATIVE mg/dL
pH: 8.5 — ABNORMAL HIGH (ref 5.0–8.0)

## 2009-03-14 ENCOUNTER — Encounter (INDEPENDENT_AMBULATORY_CARE_PROVIDER_SITE_OTHER): Payer: Self-pay | Admitting: Internal Medicine

## 2009-03-15 ENCOUNTER — Encounter (INDEPENDENT_AMBULATORY_CARE_PROVIDER_SITE_OTHER): Payer: Self-pay | Admitting: Internal Medicine

## 2009-03-27 ENCOUNTER — Telehealth (INDEPENDENT_AMBULATORY_CARE_PROVIDER_SITE_OTHER): Payer: Self-pay | Admitting: Pharmacist

## 2009-03-28 ENCOUNTER — Telehealth: Payer: Self-pay | Admitting: Internal Medicine

## 2009-03-28 ENCOUNTER — Encounter: Payer: Self-pay | Admitting: Internal Medicine

## 2009-04-04 ENCOUNTER — Encounter (INDEPENDENT_AMBULATORY_CARE_PROVIDER_SITE_OTHER): Payer: Self-pay | Admitting: Internal Medicine

## 2009-04-10 ENCOUNTER — Ambulatory Visit: Payer: Self-pay | Admitting: Internal Medicine

## 2009-04-10 DIAGNOSIS — I498 Other specified cardiac arrhythmias: Secondary | ICD-10-CM

## 2009-04-24 ENCOUNTER — Telehealth (INDEPENDENT_AMBULATORY_CARE_PROVIDER_SITE_OTHER): Payer: Self-pay | Admitting: *Deleted

## 2009-04-26 ENCOUNTER — Encounter: Payer: Self-pay | Admitting: Internal Medicine

## 2009-05-01 ENCOUNTER — Telehealth: Payer: Self-pay | Admitting: Internal Medicine

## 2009-05-17 ENCOUNTER — Telehealth (INDEPENDENT_AMBULATORY_CARE_PROVIDER_SITE_OTHER): Payer: Self-pay | Admitting: *Deleted

## 2009-05-17 ENCOUNTER — Encounter (INDEPENDENT_AMBULATORY_CARE_PROVIDER_SITE_OTHER): Payer: Self-pay | Admitting: Internal Medicine

## 2009-05-24 ENCOUNTER — Telehealth (INDEPENDENT_AMBULATORY_CARE_PROVIDER_SITE_OTHER): Payer: Self-pay | Admitting: Internal Medicine

## 2009-05-26 ENCOUNTER — Encounter: Payer: Self-pay | Admitting: Internal Medicine

## 2009-05-27 IMAGING — CR DG CHEST 2V
2 series · 2 of 2 positions shown · non-contrast
Comparison: Chest x-ray of 06/01/2007, and CT chest of 10/17/2006

CLINICAL DATA: Preop for right hip replacement, anterior left chest
pain

CHEST - 2 VIEW

[w chest pa]
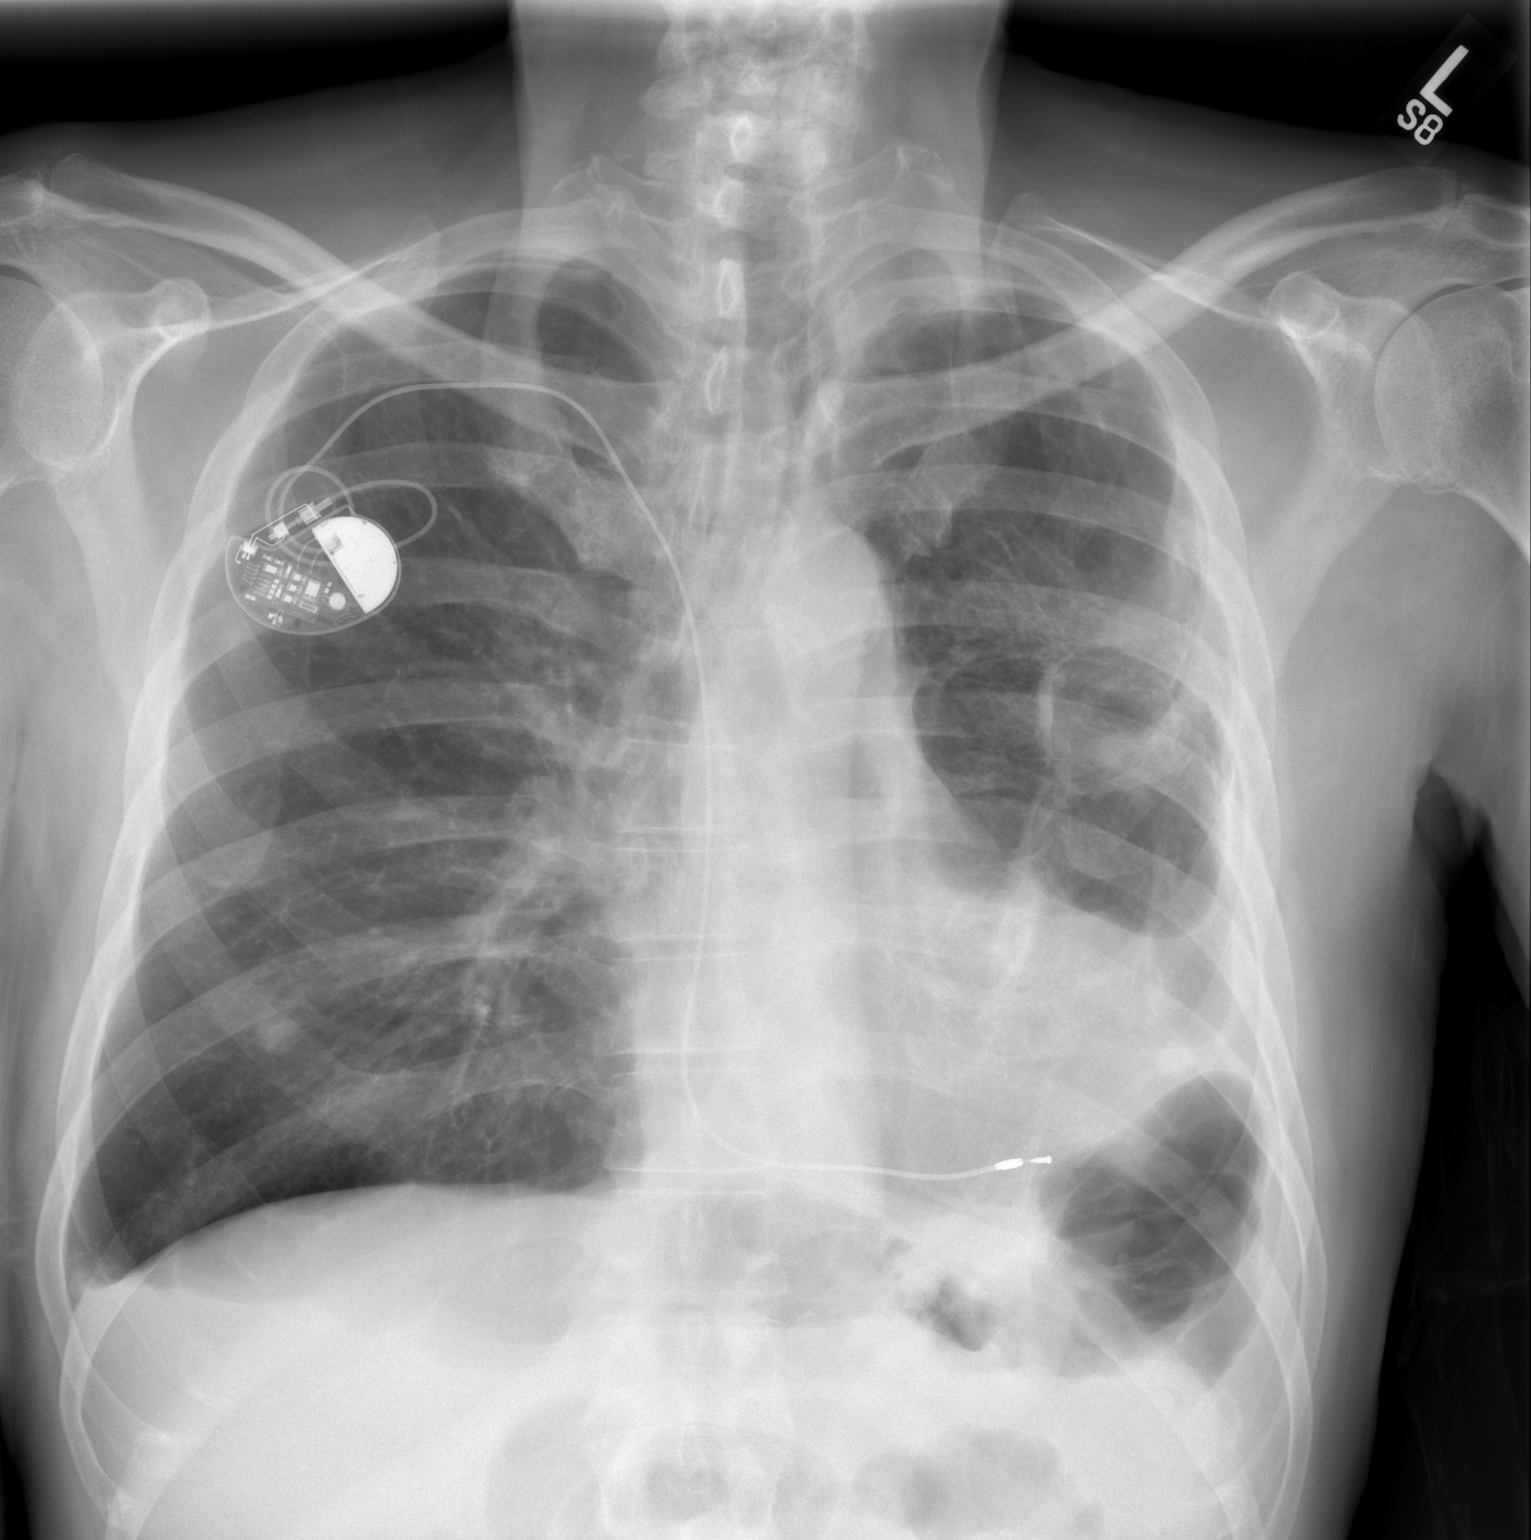

[w chest lat]
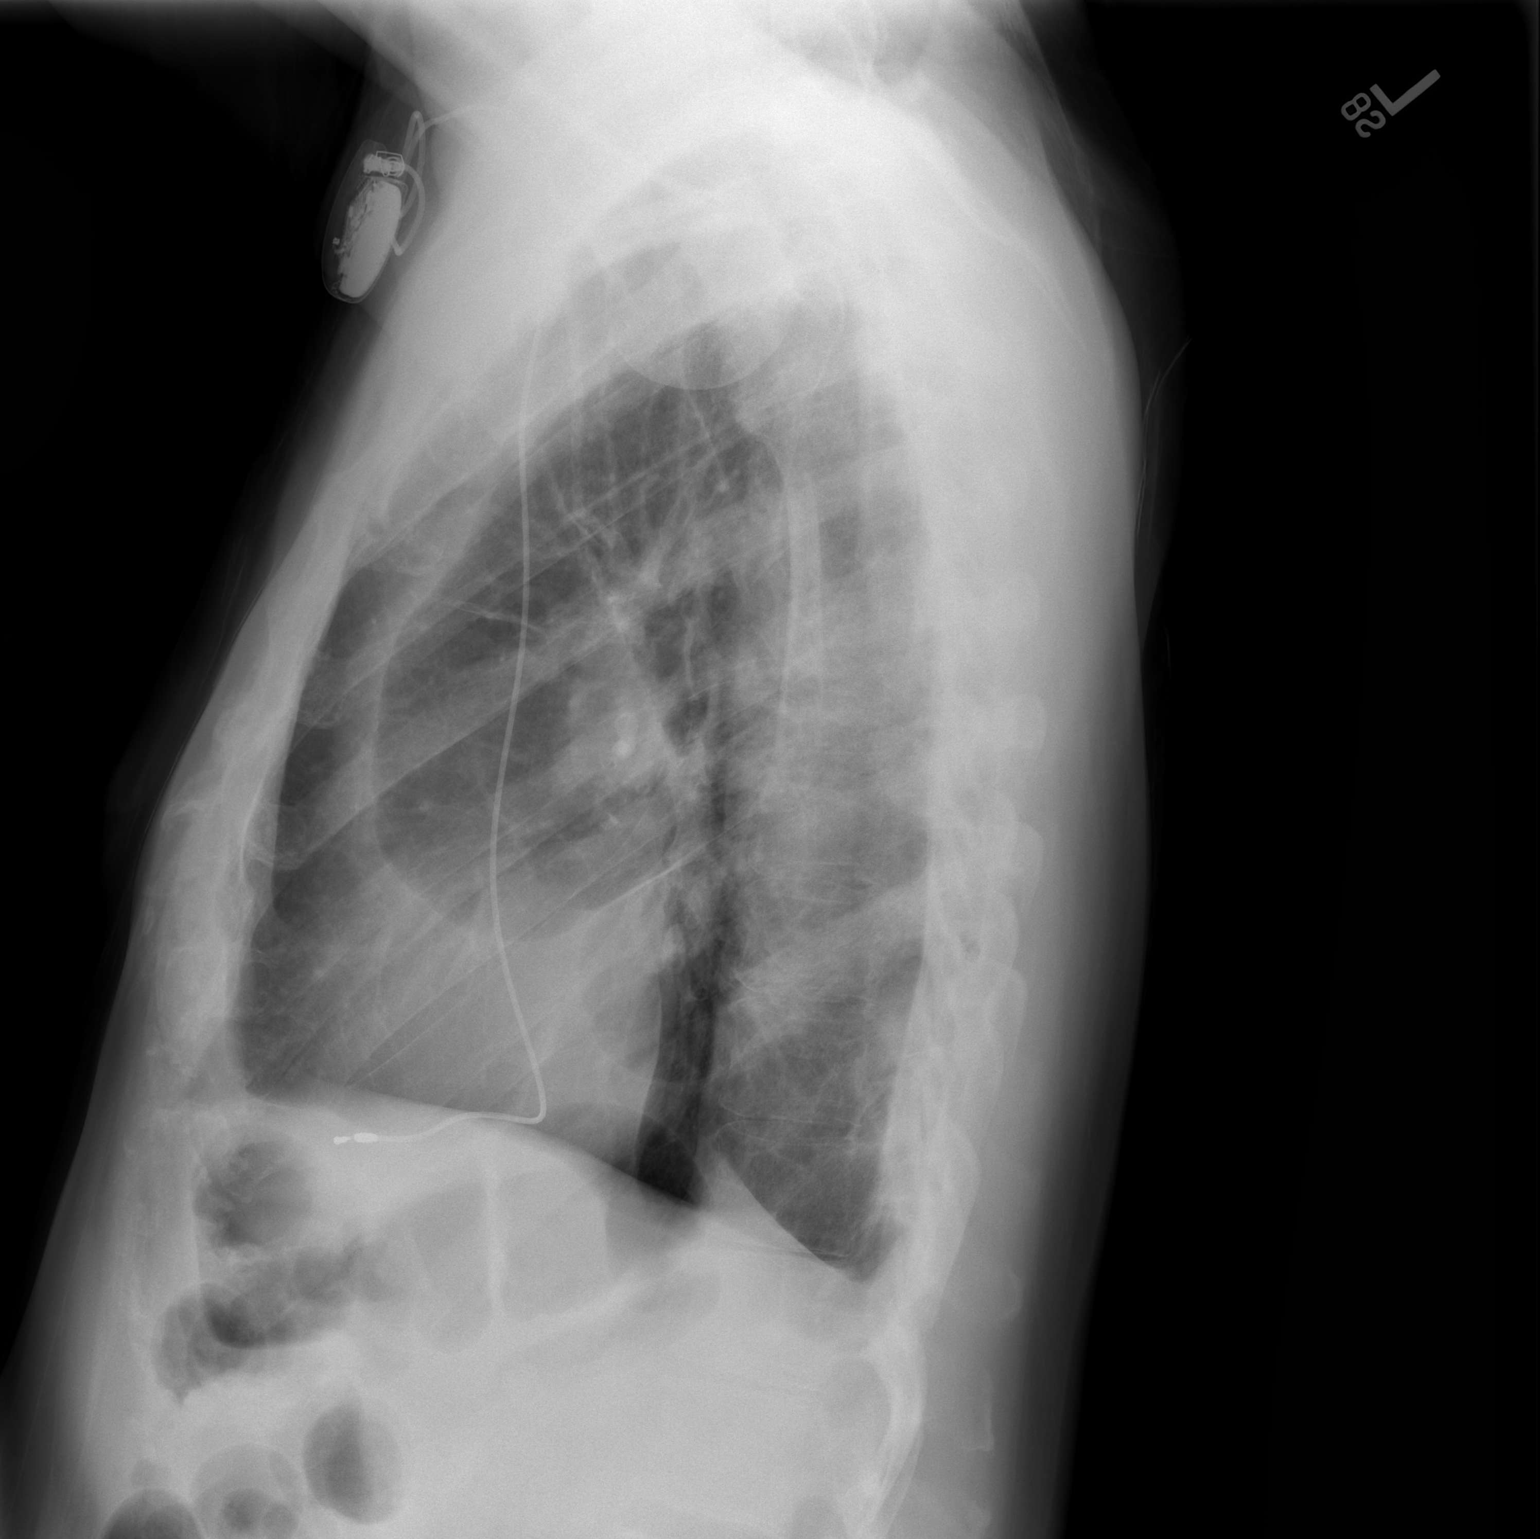

[2 of 2 positions shown; findings below may reference images not displayed]

FINDINGS: Pleural and parenchymal scarring on the left is stable.
The right lung is clear.  Small nodule is noted at the right lung
base which this consistent with nipple shadow when compared to
chest x-ray of  04/25/2007. Permanent pacemaker remains.  Heart
size is stable.
IMPRESSION: Stable chronic change.  Permanent pacemaker remains.  No active
process is seen.

## 2009-05-31 IMAGING — CR DG PORTABLE PELVIS
1 series · 1 of 1 positions shown · non-contrast
Comparison: 09/29/2007.

CLINICAL DATA: Status post right hip replacement.

PORTABLE PELVIS

[view not recorded]
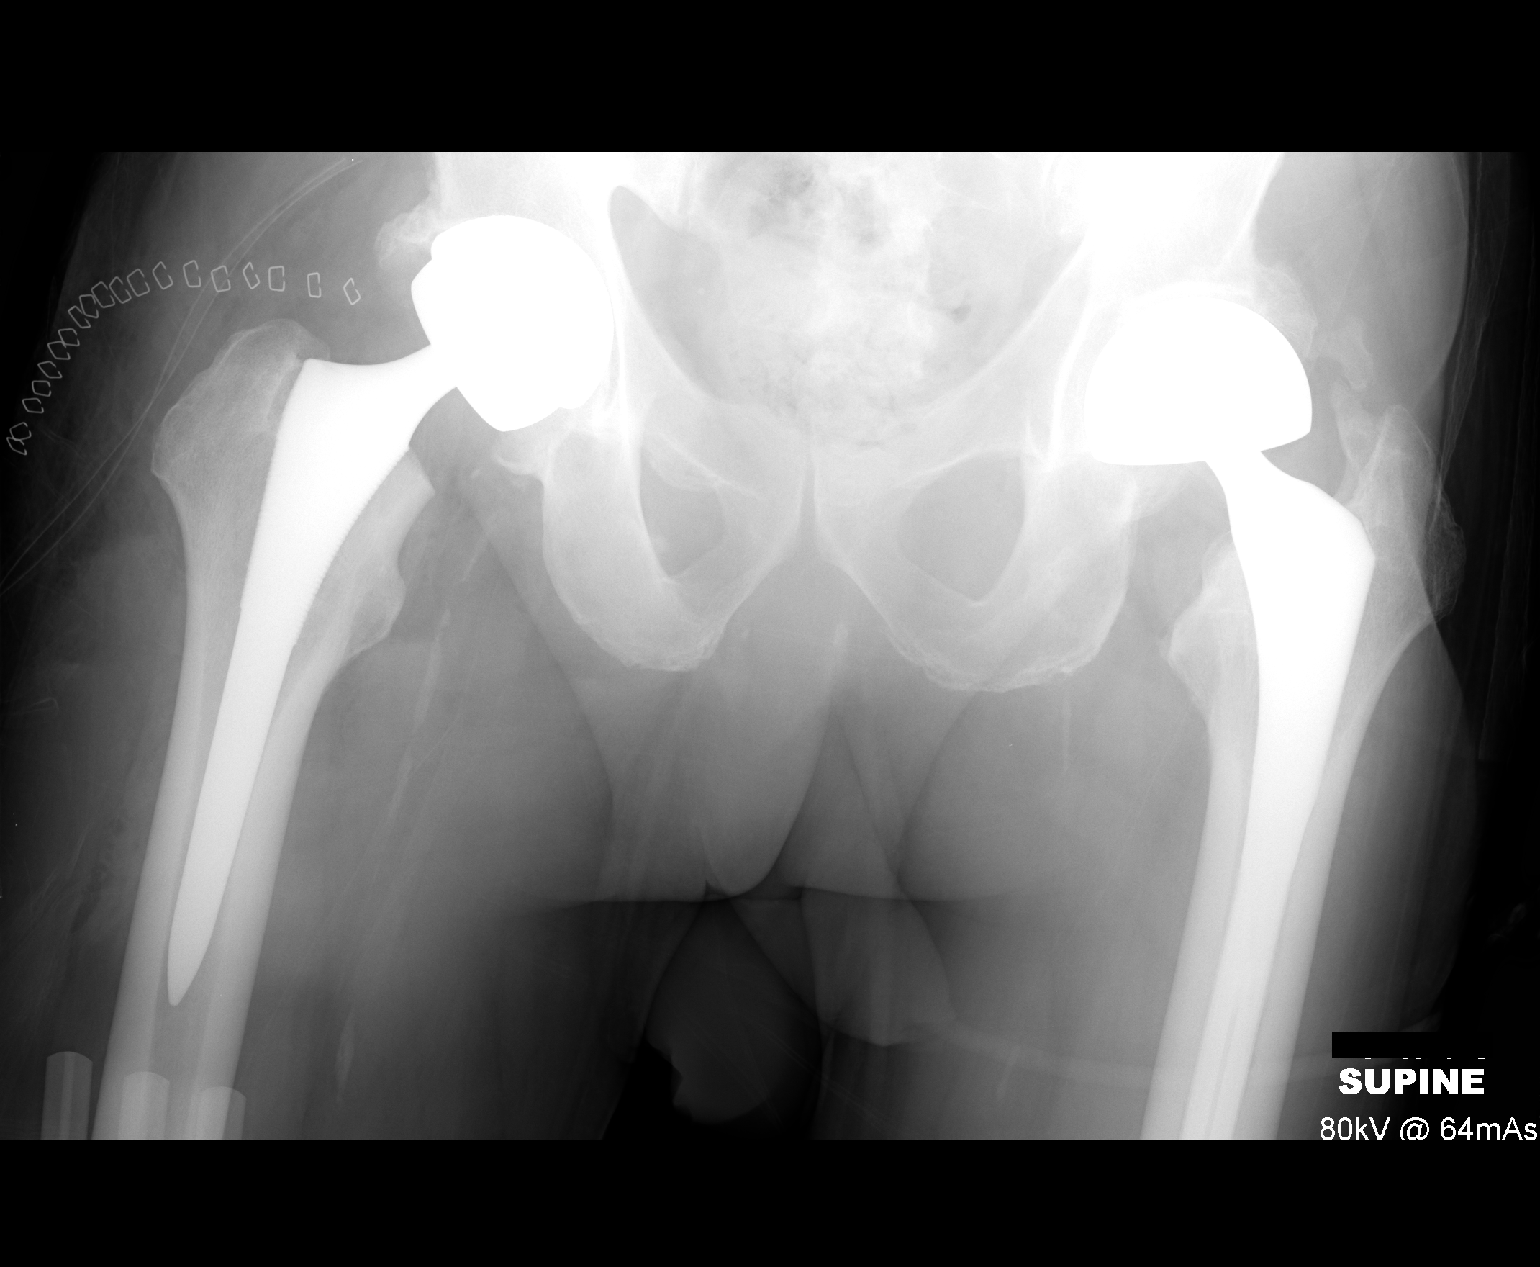

[1 of 1 positions shown; findings below may reference images not displayed]

FINDINGS: Status post recent total right hip replacement which
appears in satisfactory position on this single projection.  Remote
left total hip replacement with surrounding bony overgrowth.
Vascular calcifications.  Drain is in place.
IMPRESSION: Status post recent right total hip replacement appearing in
satisfactory position.

## 2009-05-31 IMAGING — CR DG HIP 1V PORT*R*
1 series · 1 of 1 positions shown · non-contrast
Comparison: 09/29/2007.

CLINICAL DATA: Right hip replacement.

PORTABLE RIGHT HIP - 1 VIEW

[view not recorded]
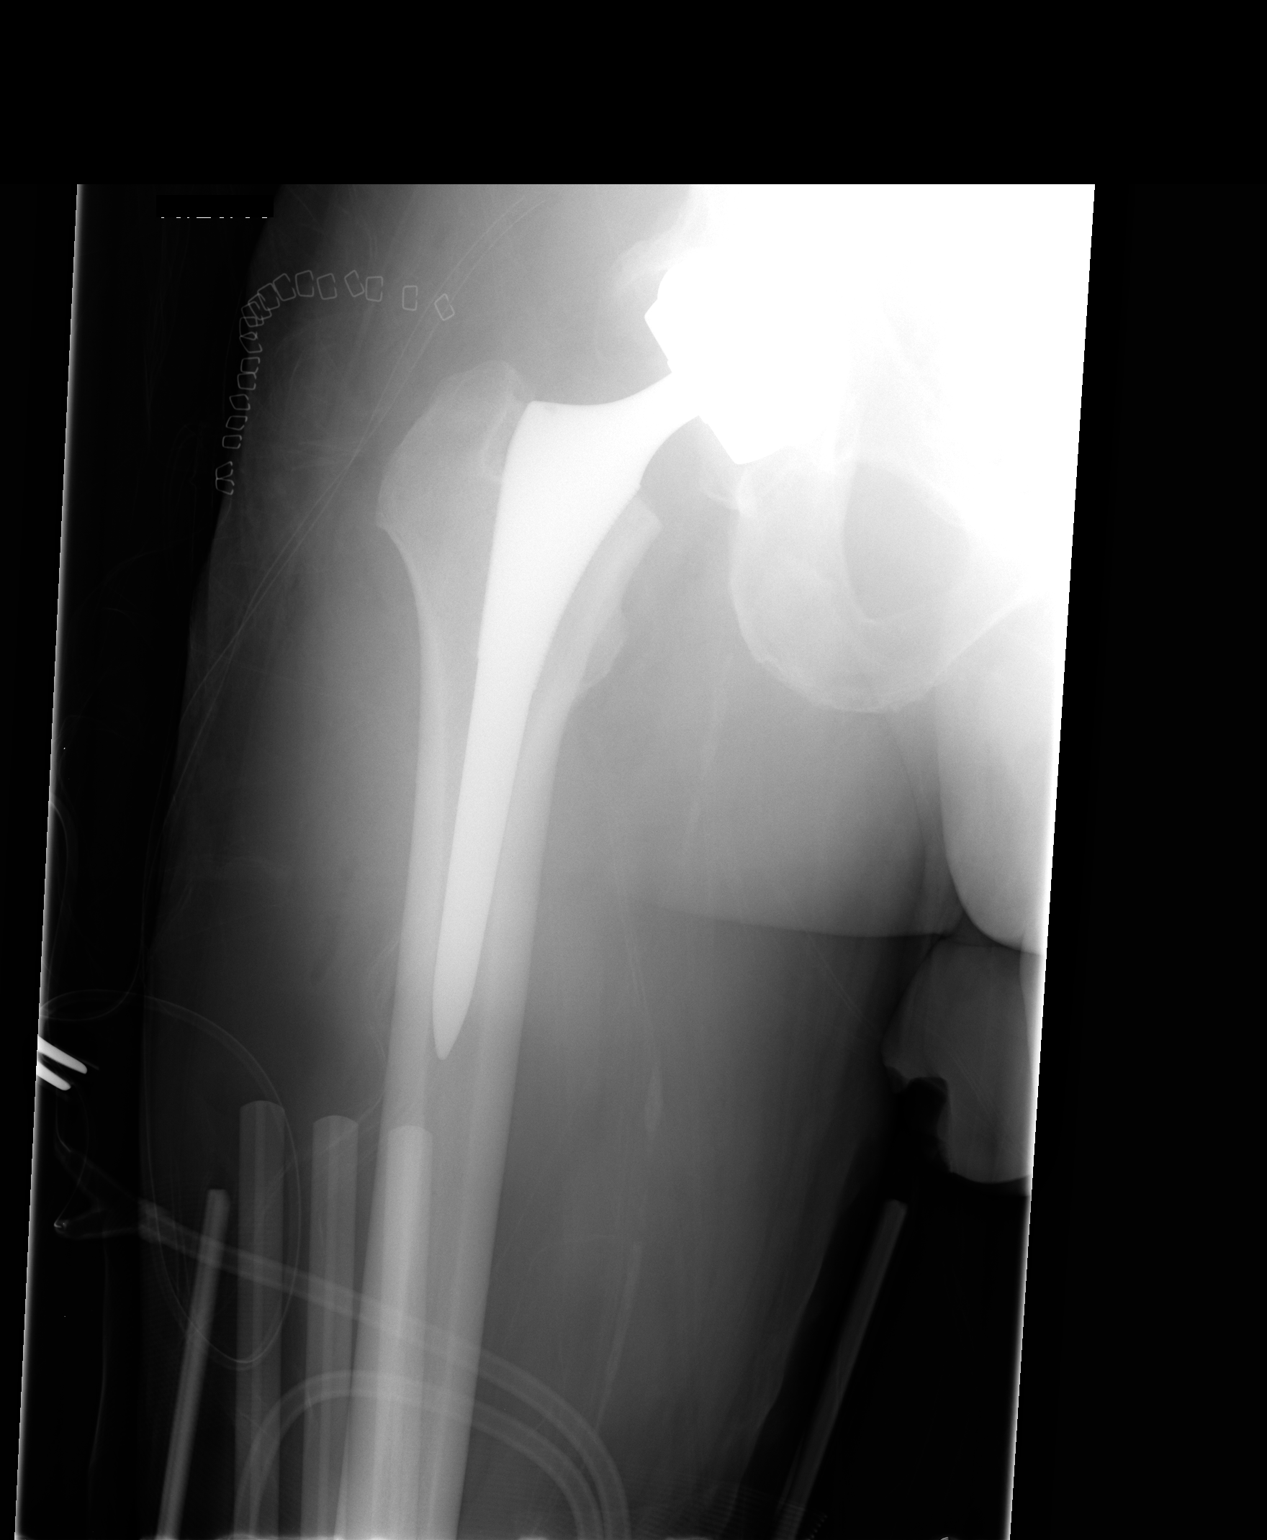

[1 of 1 positions shown; findings below may reference images not displayed]

FINDINGS: Status post total right hip replacement which appears in
satisfactory position on this single projection.  Surgical drain is
in place.  Vascular calcifications.
IMPRESSION: Satisfactory position of total right hip replacement.

## 2009-06-06 IMAGING — CR DG ABD PORTABLE 1V
1 series · 1 of 1 positions shown · non-contrast
Comparison: 11/06/2006

CLINICAL DATA: Head

ABDOMEN - 1 VIEW

[view not recorded]
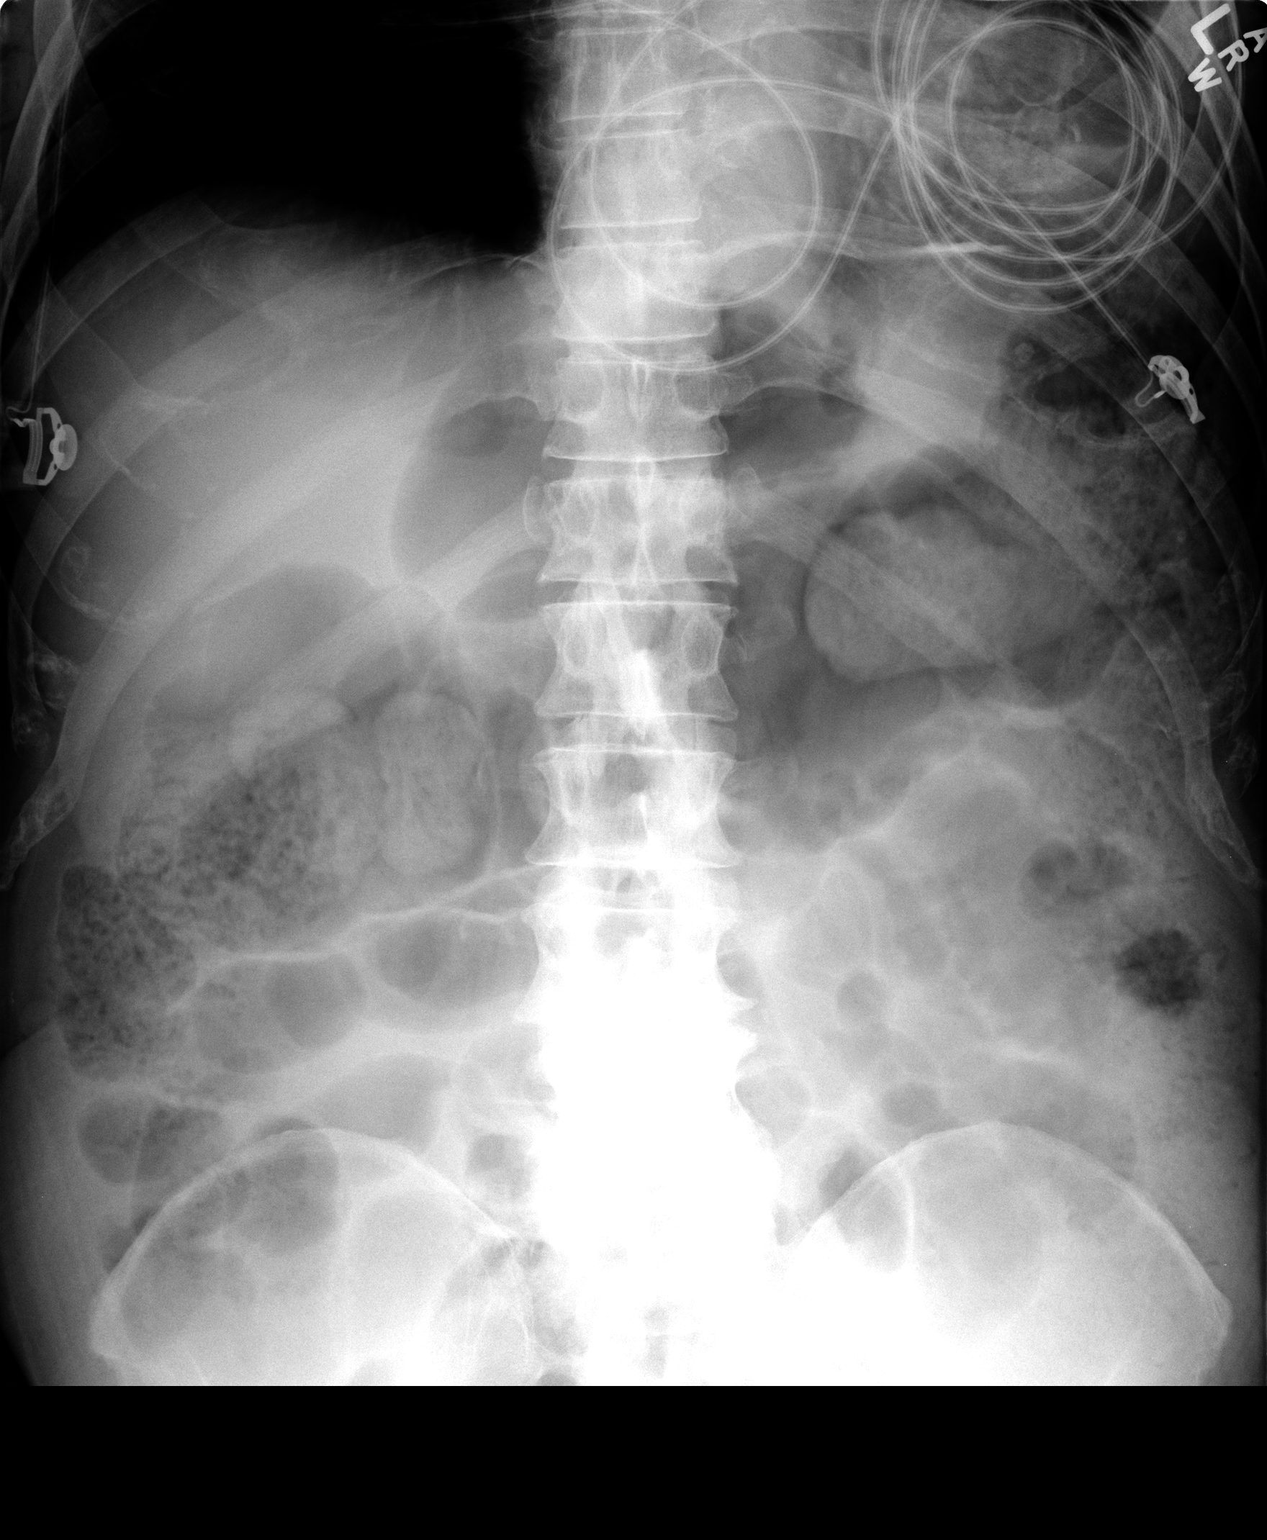

[1 of 1 positions shown; findings below may reference images not displayed]

FINDINGS: There is nonspecific dilatation of large and small bowel
loops.  No evidence for bowel obstruction.  There is a large amount
of fecal material in the transverse colon.  The bowel gas pattern
is most consistent with a nonobstructive /adynamic ileus.  There is
no pneumatosis, portal venous gas, or other acute/specific
abnormality.
IMPRESSION: 1.  Abdominal gas pattern most consistent with an adynamic ileus.
2.  Large amount of stool in the transverse colon.

## 2009-06-09 IMAGING — CR DG ABDOMEN 1V
1 series · 1 of 1 positions shown · non-contrast
Comparison: One-view abdomen 03/14/2008.

CLINICAL DATA: Small bowel obstruction.

ABDOMEN - 1 VIEW

[view not recorded]
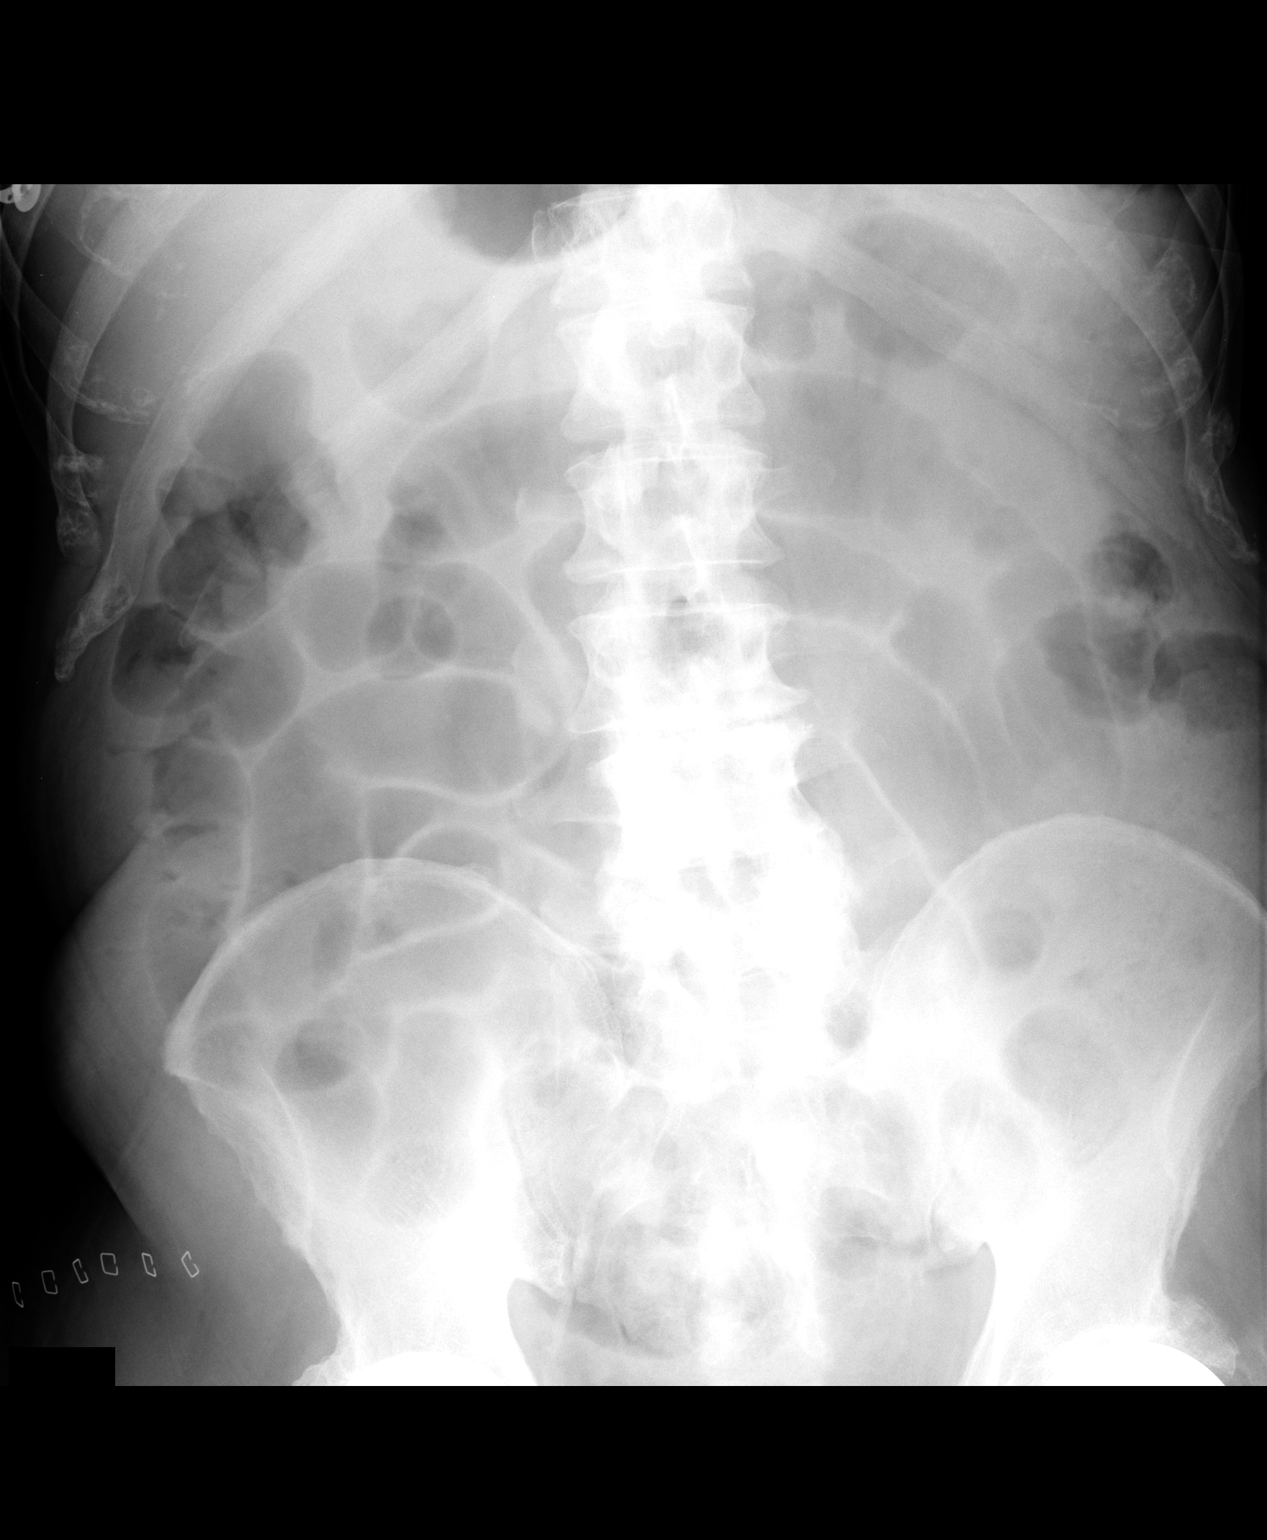

[1 of 1 positions shown; findings below may reference images not displayed]

FINDINGS: Air is noted throughout the small and large bowel.  There
is no significant bowel distension.  Fecal material noted
throughout the colon on the prior examination has decreased in
volume.  There is no supine evidence of free intraperitoneal air.
Skin staples are noted lateral to the right pelvis.  Bilateral hip
hemiarthroplasties are partially imaged.
IMPRESSION: Improved bowel distension.  No evidence of mechanical obstruction.

## 2009-06-10 IMAGING — CR DG ABD PORTABLE 1V
1 series · 1 of 1 positions shown · non-contrast
Comparison: 03/17/2008

CLINICAL DATA: Abdominal pain.  Degenerative joint disease  Right
hip.

ABDOMEN - 1 VIEW

[view not recorded]
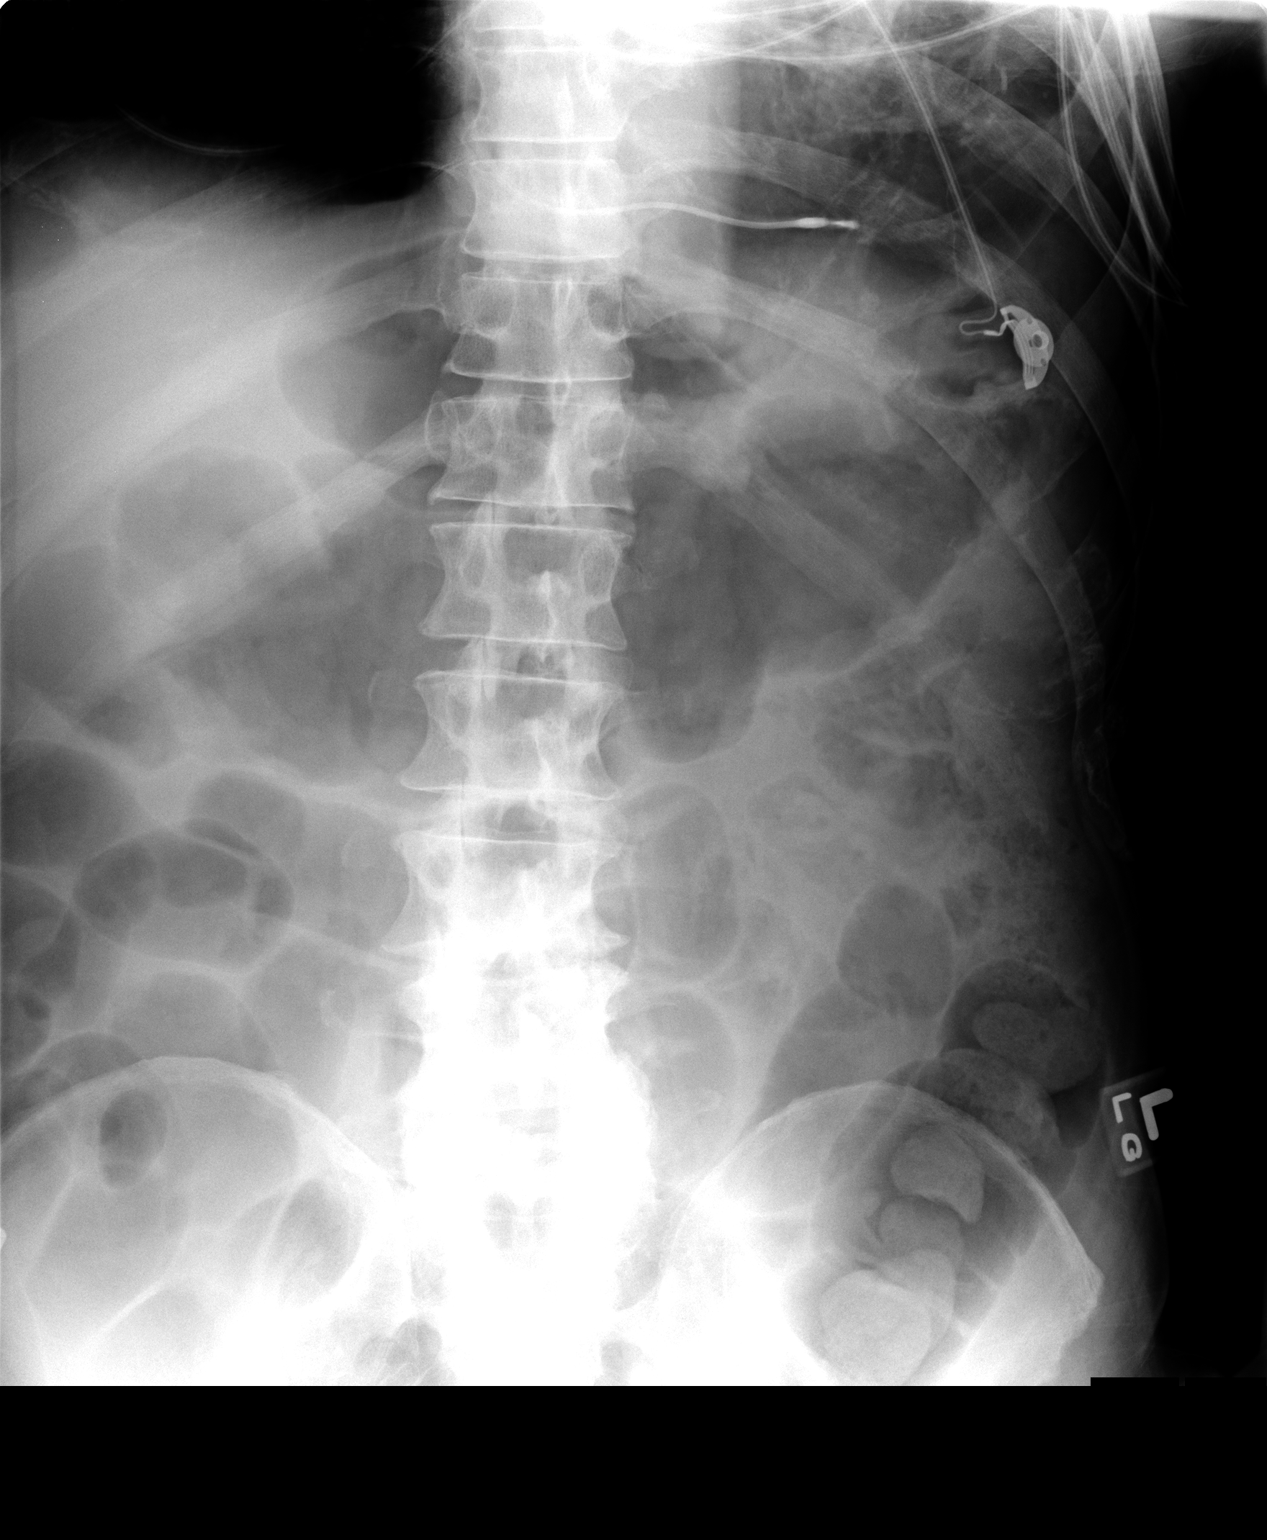

[1 of 1 positions shown; findings below may reference images not displayed]

FINDINGS: Slightly increased gaseous distention of both large and
small bowel is consistent with postoperative ileus.  Right
ventricular pacemaker lead partially visualized.  Lumbar
spondylosis. Distal progression of colonic gas.
IMPRESSION: Gaseous distention of bowel most consistent with postoperative
ileus.

## 2009-06-12 IMAGING — CR DG ABDOMEN 2V
1 series · 1 of 1 positions shown · non-contrast
Comparison: 03/18/2008

CLINICAL DATA: Diarrhea.  Abdominal pain.

ABDOMEN - 2 VIEW

[w abdomen decub]
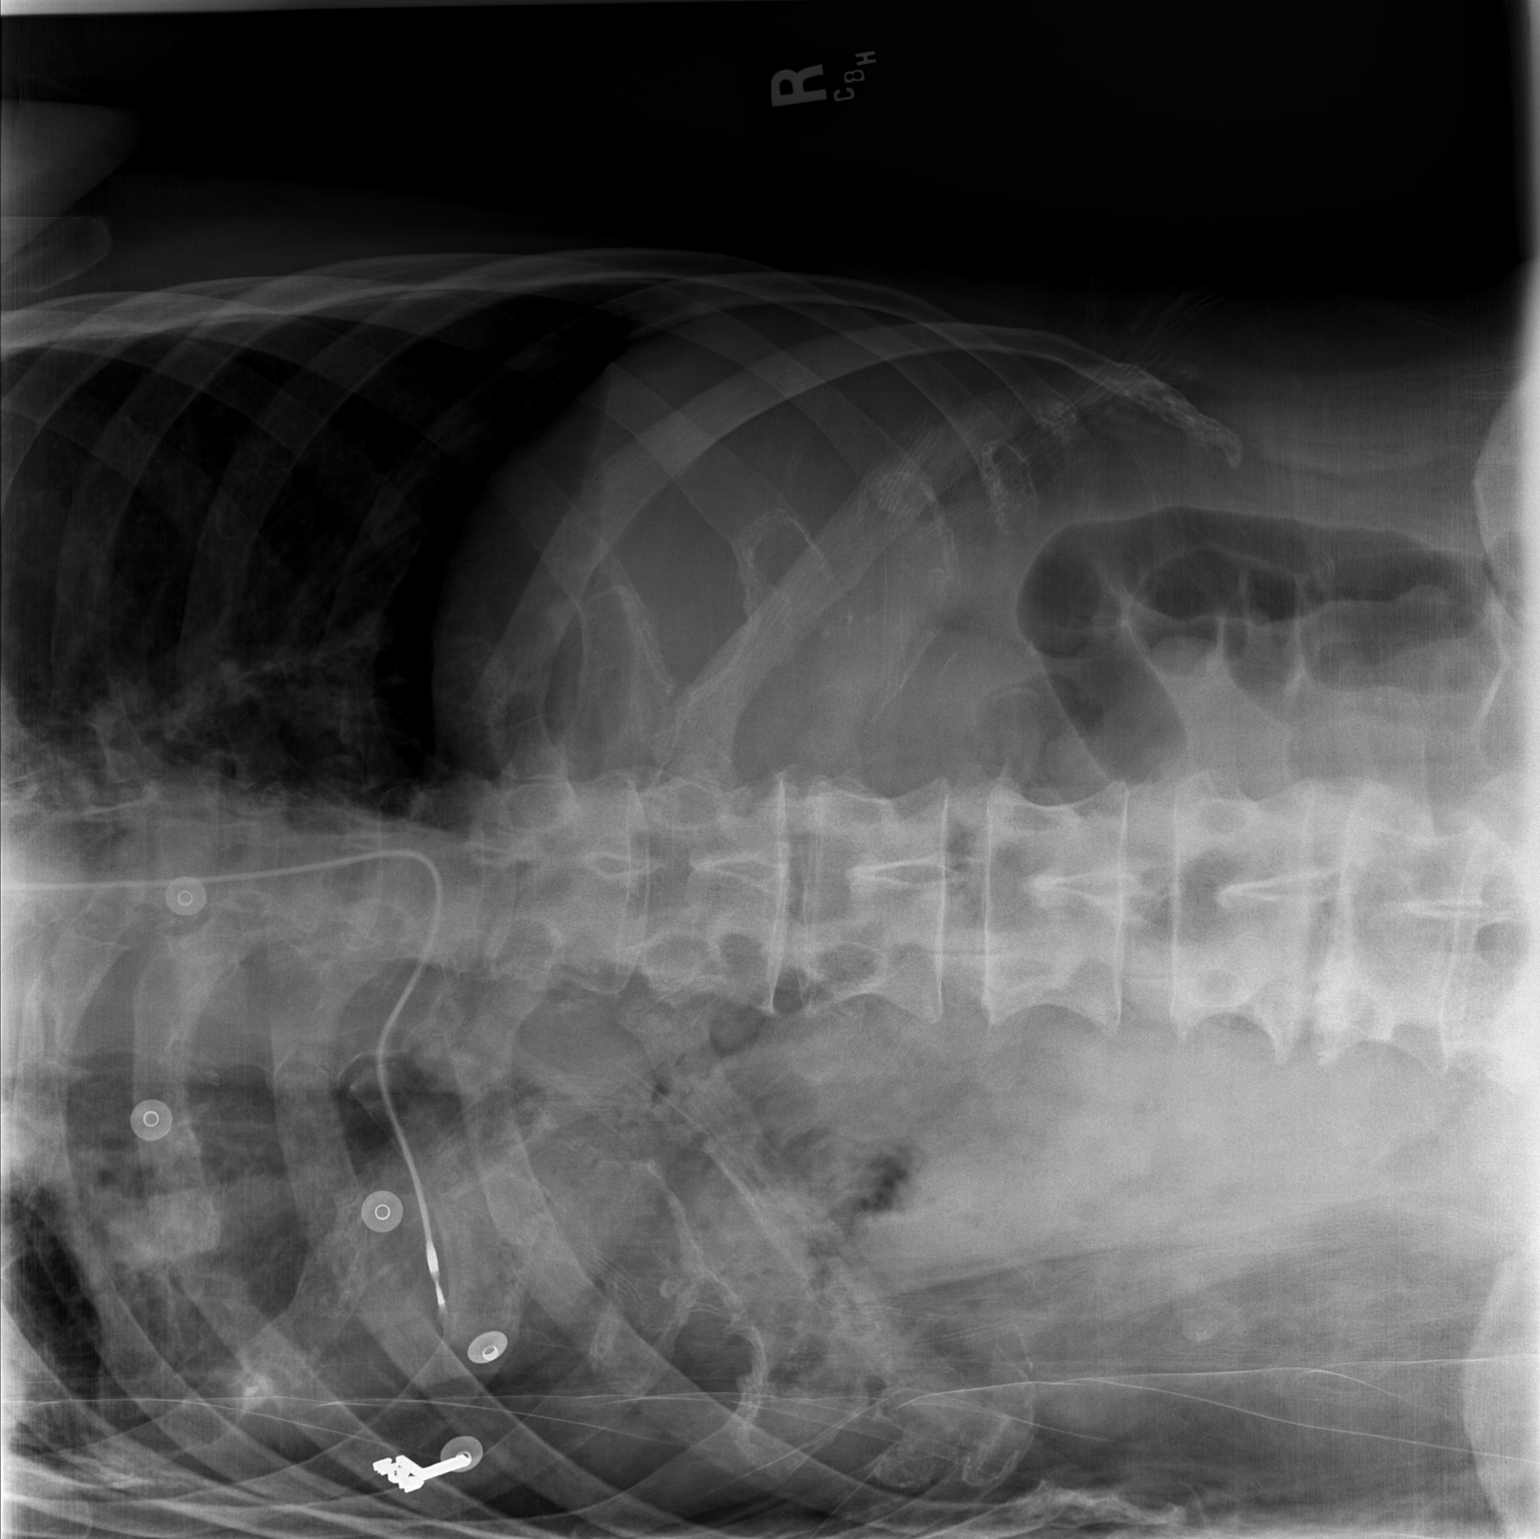

[1 of 1 positions shown; findings below may reference images not displayed]

FINDINGS: Supine and right side up decubitus views.  Supine view
demonstrates a decrease in gaseous distention of small bowel, now
mild.  No evidence of pneumatosis or free intraperitoneal air.
Right side up decubitus view demonstrates no free intraperitoneal
air.  No significant air fluid levels.

Relative paucity of colonic gas.  No abnormal abdominal
calcifications.
IMPRESSION: 1.  Improvement in small bowel distention likely related to
adynamic ileus.

## 2009-06-19 IMAGING — CR DG CHEST 1V PORT
1 series · 1 of 1 positions shown · non-contrast
Comparison: 03/20/2008

CLINICAL DATA: Atrial fibrillation

PORTABLE CHEST - 1 VIEW

[view not recorded]
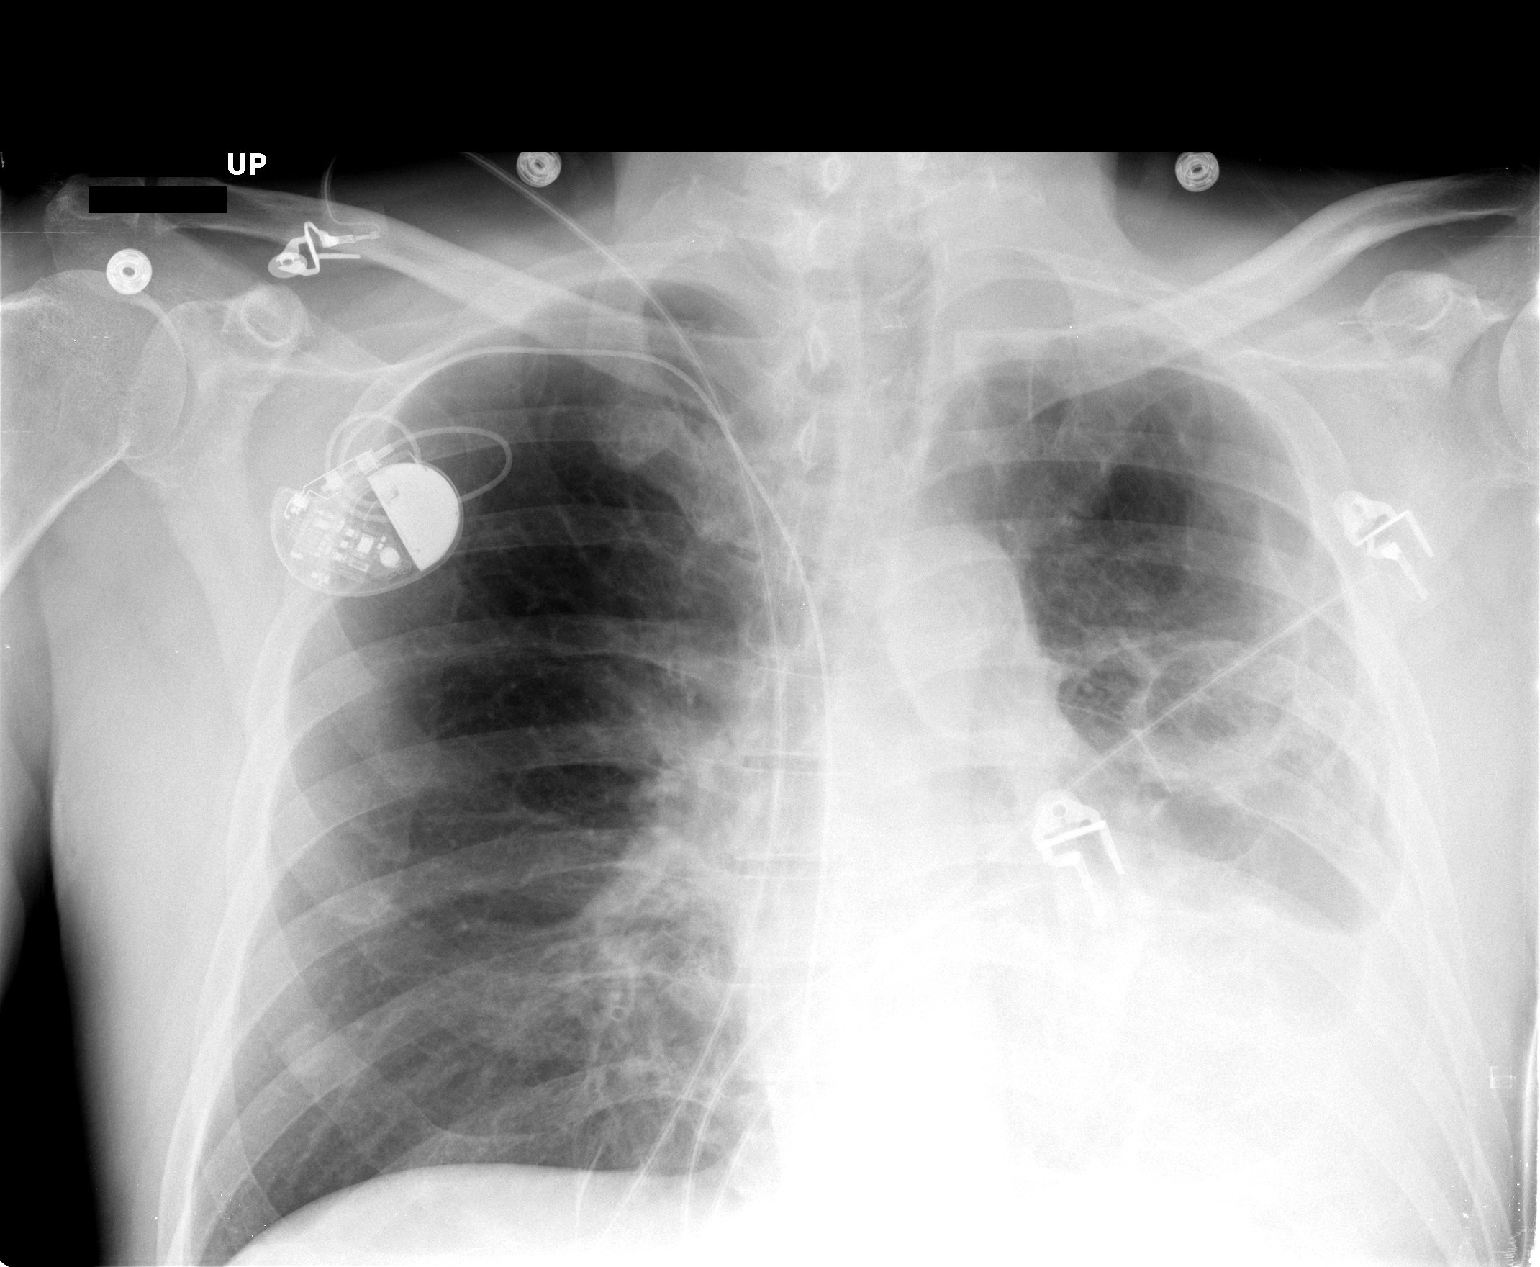

[1 of 1 positions shown; findings below may reference images not displayed]

FINDINGS: Stable appearance of pacemaker.  Chronic disease of the
left lower lung is stable.  There may be a component of left
pleural fluid.  Atelectasis of right lower lung stable.  No edema.
Stable heart size.
IMPRESSION: Chronic changes at left lung base, probable component of left
pleural fluid and stable atelectasis at right lung base.  No overt
edema identified.

## 2009-06-23 ENCOUNTER — Telehealth (INDEPENDENT_AMBULATORY_CARE_PROVIDER_SITE_OTHER): Payer: Self-pay | Admitting: Internal Medicine

## 2009-06-26 ENCOUNTER — Encounter (INDEPENDENT_AMBULATORY_CARE_PROVIDER_SITE_OTHER): Payer: Self-pay | Admitting: Internal Medicine

## 2009-07-05 ENCOUNTER — Telehealth (INDEPENDENT_AMBULATORY_CARE_PROVIDER_SITE_OTHER): Payer: Self-pay | Admitting: Internal Medicine

## 2009-07-06 ENCOUNTER — Encounter (INDEPENDENT_AMBULATORY_CARE_PROVIDER_SITE_OTHER): Payer: Self-pay | Admitting: Internal Medicine

## 2009-07-06 ENCOUNTER — Ambulatory Visit: Payer: Self-pay | Admitting: Internal Medicine

## 2009-07-11 ENCOUNTER — Telehealth: Payer: Self-pay | Admitting: *Deleted

## 2009-07-19 LAB — CONVERTED CEMR LAB
Amphetamine Screen, Ur: NEGATIVE
Benzodiazepines.: NEGATIVE
CO2: 26 meq/L (ref 19–32)
Calcium: 9.1 mg/dL (ref 8.4–10.5)
Chloride: 110 meq/L (ref 96–112)
Cholesterol: 122 mg/dL (ref 0–200)
Cocaine Metabolites: NEGATIVE
Creatinine, Ser: 1.27 mg/dL (ref 0.40–1.50)
Glucose, Bld: 91 mg/dL (ref 70–99)
Marijuana Metabolite: POSITIVE — AB
Methadone: NEGATIVE
Total Bilirubin: 0.9 mg/dL (ref 0.3–1.2)
Total CHOL/HDL Ratio: 2.1
Total Protein: 6.6 g/dL (ref 6.0–8.3)
Triglycerides: 36 mg/dL (ref <150)
VLDL: 7 mg/dL (ref 0–40)

## 2009-07-21 ENCOUNTER — Telehealth (INDEPENDENT_AMBULATORY_CARE_PROVIDER_SITE_OTHER): Payer: Self-pay | Admitting: Internal Medicine

## 2009-07-24 ENCOUNTER — Encounter: Payer: Self-pay | Admitting: Internal Medicine

## 2009-07-24 ENCOUNTER — Telehealth: Payer: Self-pay | Admitting: Internal Medicine

## 2009-07-24 ENCOUNTER — Encounter (INDEPENDENT_AMBULATORY_CARE_PROVIDER_SITE_OTHER): Payer: Self-pay | Admitting: Internal Medicine

## 2009-08-04 ENCOUNTER — Telehealth: Payer: Self-pay | Admitting: Internal Medicine

## 2009-08-04 ENCOUNTER — Telehealth (INDEPENDENT_AMBULATORY_CARE_PROVIDER_SITE_OTHER): Payer: Self-pay | Admitting: Internal Medicine

## 2009-08-16 ENCOUNTER — Ambulatory Visit (HOSPITAL_COMMUNITY): Admission: RE | Admit: 2009-08-16 | Discharge: 2009-08-16 | Payer: Self-pay | Admitting: Internal Medicine

## 2009-08-16 ENCOUNTER — Ambulatory Visit: Payer: Self-pay | Admitting: Internal Medicine

## 2009-08-16 ENCOUNTER — Telehealth (INDEPENDENT_AMBULATORY_CARE_PROVIDER_SITE_OTHER): Payer: Self-pay | Admitting: *Deleted

## 2009-08-16 ENCOUNTER — Encounter: Payer: Self-pay | Admitting: Pharmacist

## 2009-08-16 DIAGNOSIS — R634 Abnormal weight loss: Secondary | ICD-10-CM | POA: Insufficient documentation

## 2009-08-16 LAB — CONVERTED CEMR LAB
Basophils Absolute: 0 10*3/uL (ref 0.0–0.1)
Basophils Relative: 0 % (ref 0–1)
Free T4: 1.6 ng/dL (ref 0.80–1.80)
Hemoglobin: 15.3 g/dL (ref 13.0–17.0)
INR: 1
INR: 1
INR: 1.06 (ref <1.50)
Lymphocytes Relative: 37 % (ref 12–46)
MCHC: 32.7 g/dL (ref 30.0–36.0)
Monocytes Absolute: 0.5 10*3/uL (ref 0.1–1.0)
Neutro Abs: 4.4 10*3/uL (ref 1.7–7.7)
Neutrophils Relative %: 54 % (ref 43–77)
Platelets: 170 10*3/uL (ref 150–400)
Prothrombin Time: 13.7 s (ref 11.6–15.2)
RDW: 15.4 % (ref 11.5–15.5)
TSH: 1.38 microintl units/mL (ref 0.350–4.5)

## 2009-08-18 ENCOUNTER — Encounter: Payer: Self-pay | Admitting: Internal Medicine

## 2009-08-23 IMAGING — CR DG CHEST 2V
1 series · 1 of 1 positions shown · non-contrast
Comparison: 03/27/2008

CLINICAL DATA: Vomiting

CHEST - 2 VIEW

[w chest lat]
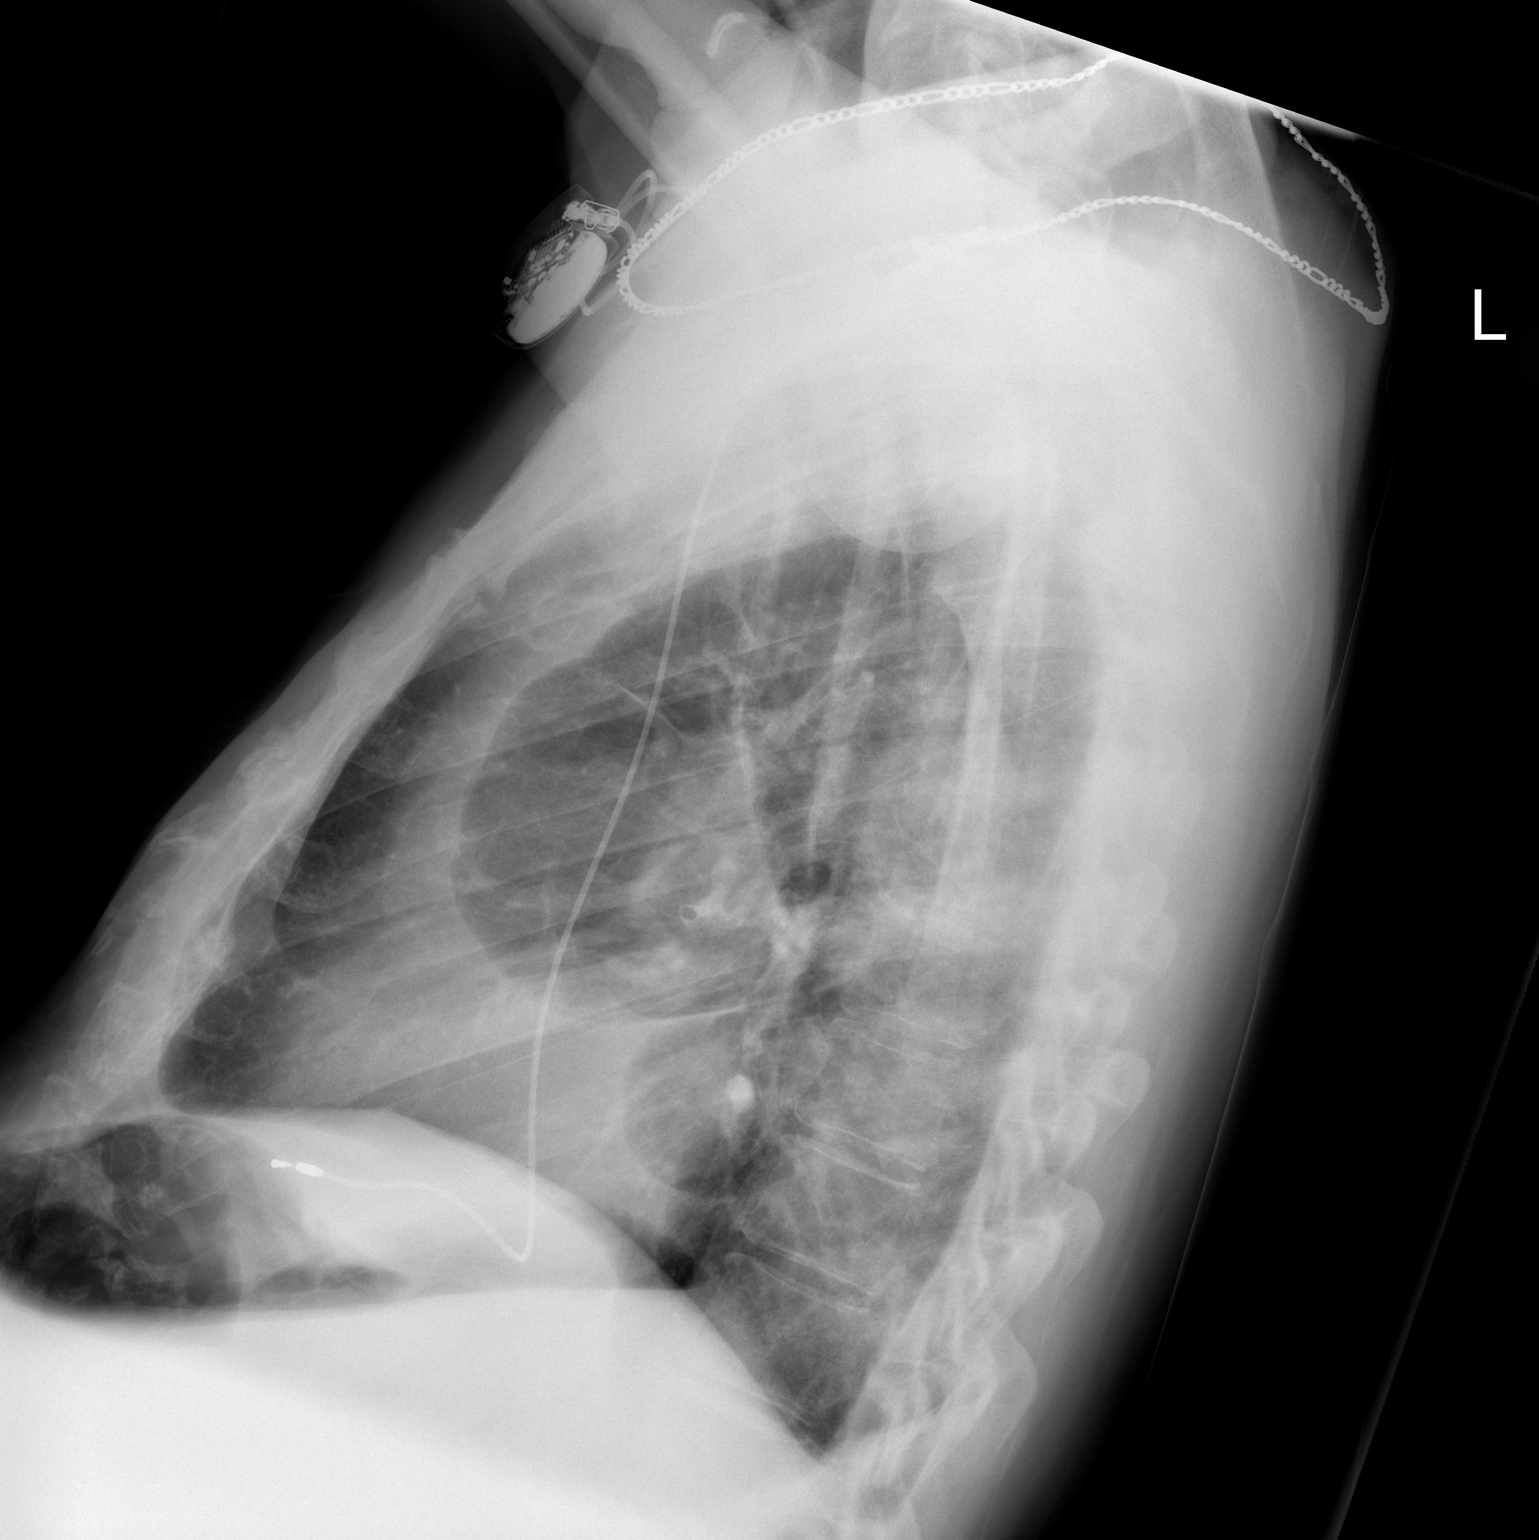

[1 of 1 positions shown; findings below may reference images not displayed]

This study is being redictated, as the initial dictation was lost
due to computer failure.
FINDINGS: Single lead pacer via a right subclavian approach appears
stable.  Cardiomediastinal silhouette is stable.  There is stable
chronic blunting of the left costophrenic angle.  The right lung is
clear.
IMPRESSION: Stable appearance of the chest.  Persistent opacity in the left
costophrenic angle may be due to scarring, atelectasis, and/or
pleural fluid.

## 2009-08-23 IMAGING — CT CT ABDOMEN W/O CM
2 of 4 series · 17 of 46 positions shown, 19 images · non-contrast
Comparison: 03/04/2007

CT ABDOMEN

CLINICAL DATA: Right-sided abdominal pelvic pain with nausea and
vomiting, fever, and chills.

CT ABDOMEN AND PELVIS WITHOUT CONTRAST
TECHNIQUE: Multidetector CT imaging of the abdomen and pelvis was
performed following the standard protocol without intravenous
contrast. Intravenous contrast was not utilized secondary patient's
elevated creatinine.

[Series 2: abd/pelv w/o 5.0 b31f st · axial · non-contrast · 0.74mm/px · z∈[-562,-92]mm · 14 of 104 slices shown, 16 images]
[im 5/104  soft-tissue]
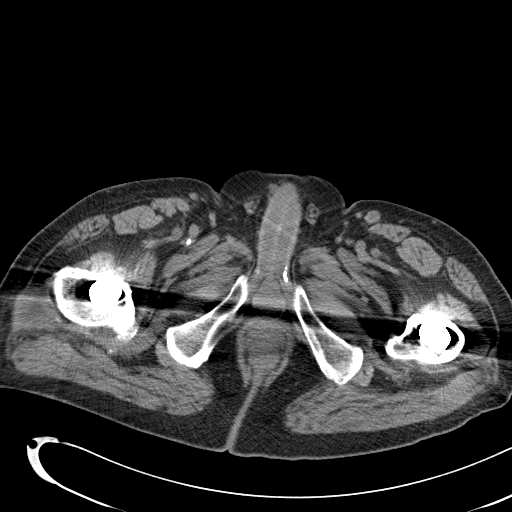
[im 5/104  bone]
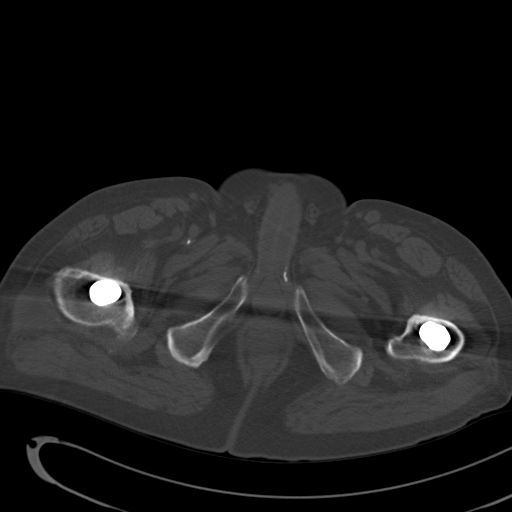
[im 13/104  soft-tissue]
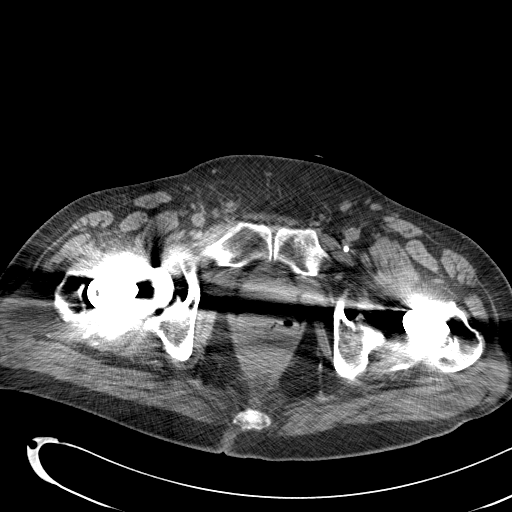
[im 22/104  soft-tissue]
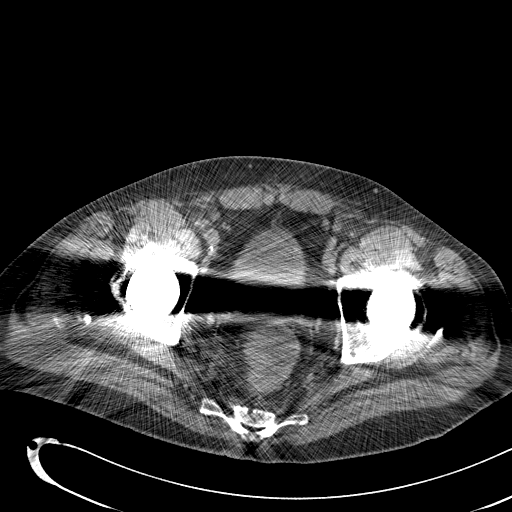
[im 26/104  soft-tissue]
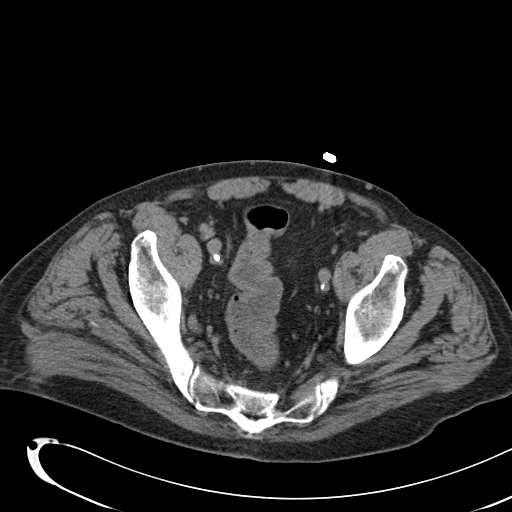
[im 35/104  soft-tissue]
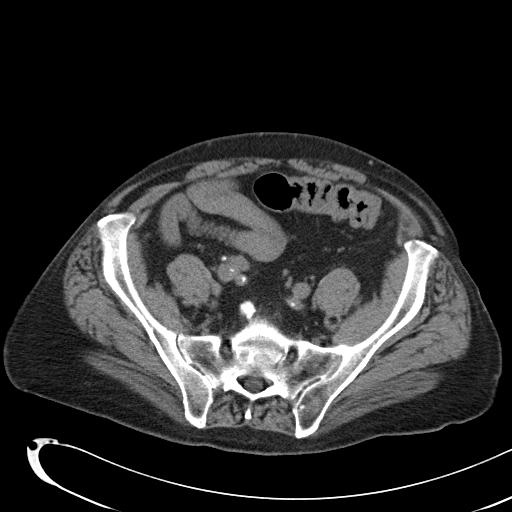
[im 43/104  soft-tissue]
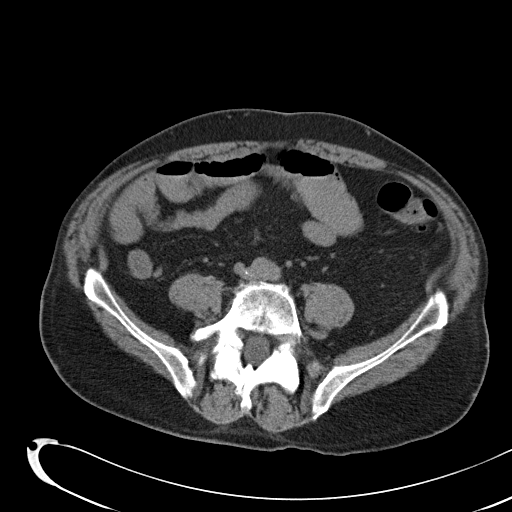
[im 48/104  soft-tissue]
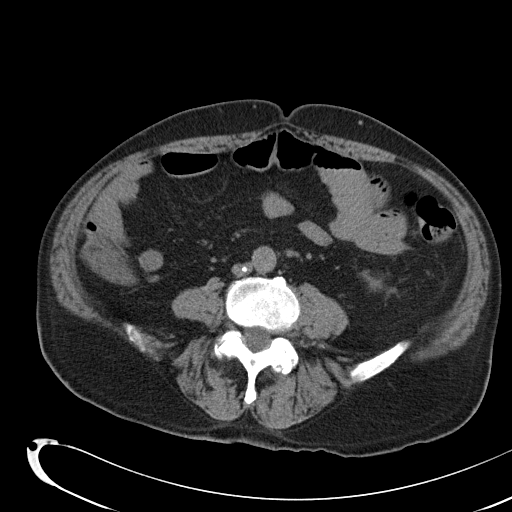
[im 56/104  soft-tissue]
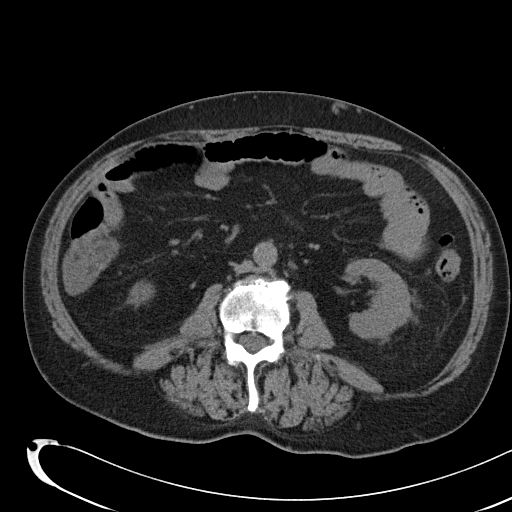
[im 61/104  soft-tissue]
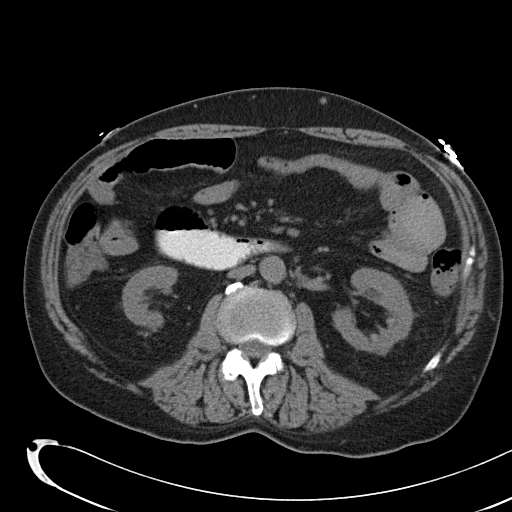
[im 61/104  bone]
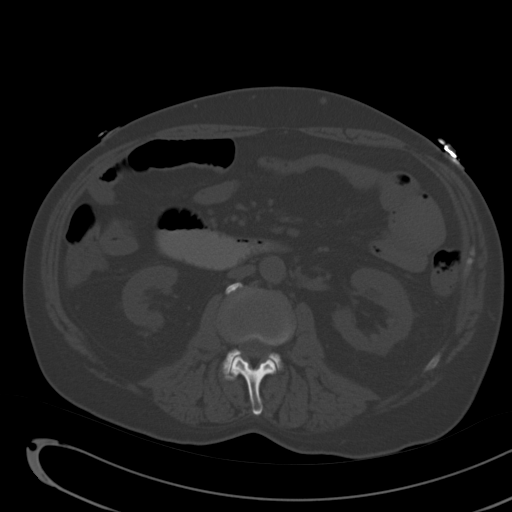
[im 69/104  soft-tissue]
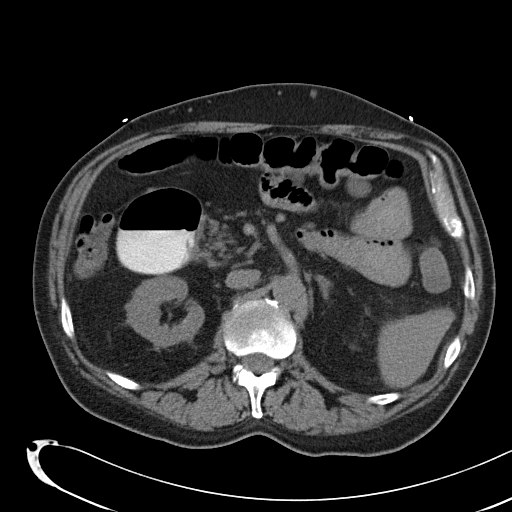
[im 78/104  soft-tissue]
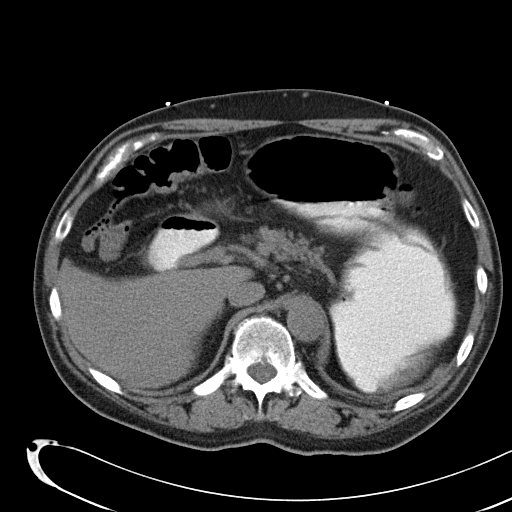
[im 82/104  soft-tissue]
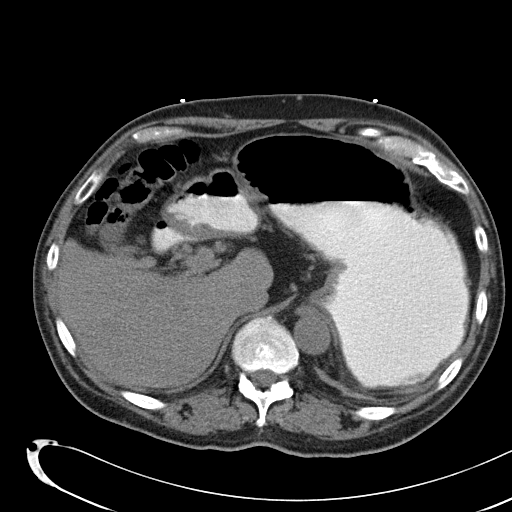
[im 91/104  soft-tissue]
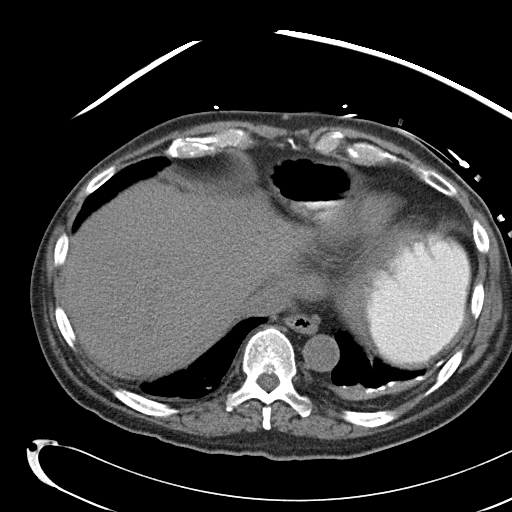
[im 99/104  soft-tissue]
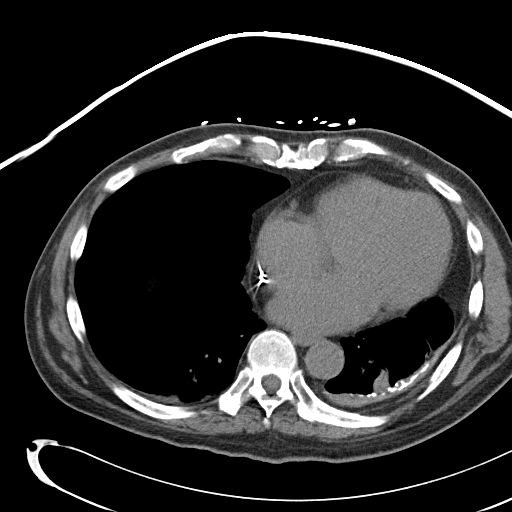

[Series 5: abd/pelv w/o 2.0 spo thins · coronal · non-contrast · 1.00mm/px · 3 of 125 slices shown]
[im 42/125  soft-tissue]
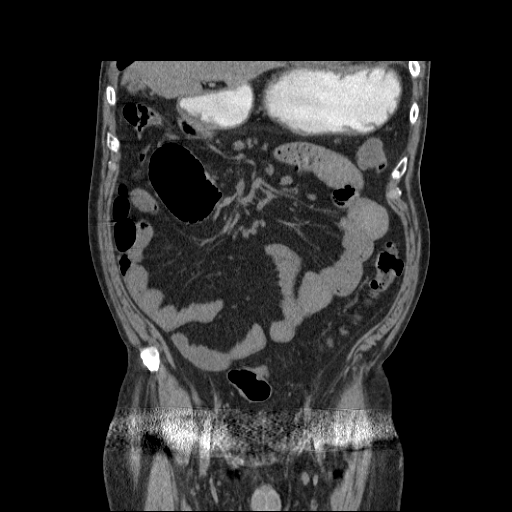
[im 56/125  soft-tissue]
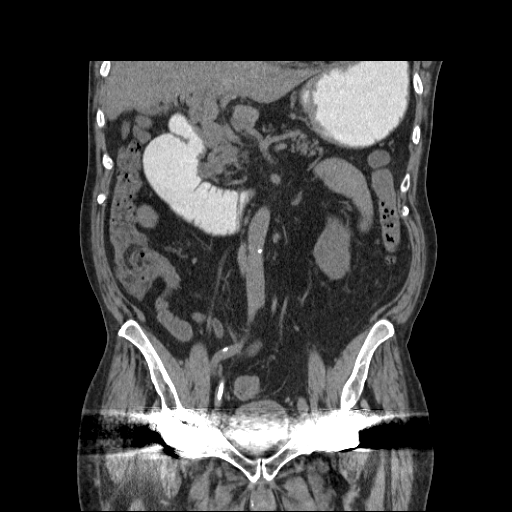
[im 69/125  soft-tissue]
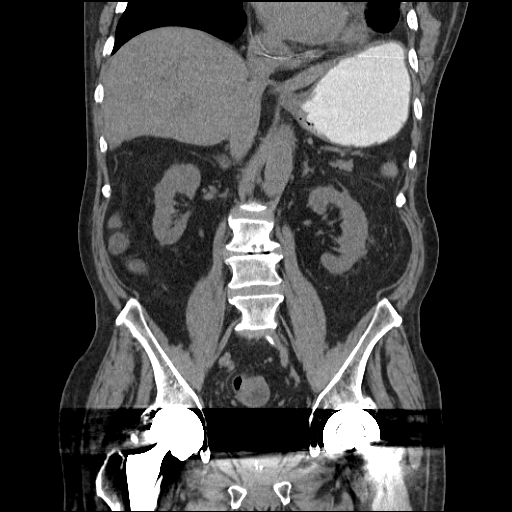

[17 of 46 positions shown; findings below may reference images not displayed]

FINDINGS: Scarring/atelectasis at the left lung base is again
identified with pleural thickening and calcifications. A pacemaker
wire is again noted.

The liver is unchanged with evidence of cavernous transformation of
the portal vein. Left hepatic atrophy is again noted.
The spleen, pancreas, adrenal glands and gallbladder are
unremarkable except for a tiny left adrenal myelolipoma. The
kidneys bilaterally are stable.
No free fluid, enlarged lymph nodes, biliary dilation or abdominal
aortic aneurysm identified.
The visualized bowel is unremarkable.
No acute or suspicious bony abnormalities are identified.
IMPRESSION: No evidence of acute abnormality within the abdomen.

Chronic changes of cavernous transformation the portal vein and
left hepatic atrophy.

CT PELVIS
FINDINGS: The visualized bowel and bladder are within normal
limits.
The appendix is unchanged and within normal limits.
There is no evidence of free fluid or enlarged lymph nodes.
Bilateral hip replacements obscures detail within the lower pelvis.
Moderate degenerative disc disease and spondylosis at L3-L4 and
bilateral L5 pars defects are unchanged.
IMPRESSION: No evidence of acute abnormality within the pelvis.

## 2009-08-24 ENCOUNTER — Telehealth: Payer: Self-pay | Admitting: *Deleted

## 2009-08-25 ENCOUNTER — Encounter: Payer: Self-pay | Admitting: Internal Medicine

## 2009-08-28 ENCOUNTER — Encounter (HOSPITAL_COMMUNITY): Admission: RE | Admit: 2009-08-28 | Discharge: 2009-10-19 | Payer: Self-pay | Admitting: Internal Medicine

## 2009-08-31 ENCOUNTER — Ambulatory Visit: Payer: Self-pay | Admitting: Internal Medicine

## 2009-09-04 ENCOUNTER — Encounter: Payer: Self-pay | Admitting: Internal Medicine

## 2009-09-04 ENCOUNTER — Ambulatory Visit: Payer: Self-pay | Admitting: Internal Medicine

## 2009-09-04 LAB — CONVERTED CEMR LAB: INR: 1.1

## 2009-09-07 ENCOUNTER — Telehealth: Payer: Self-pay | Admitting: Internal Medicine

## 2009-09-07 ENCOUNTER — Encounter: Payer: Self-pay | Admitting: Internal Medicine

## 2009-09-21 ENCOUNTER — Telehealth: Payer: Self-pay | Admitting: *Deleted

## 2009-09-22 ENCOUNTER — Encounter: Payer: Self-pay | Admitting: Internal Medicine

## 2009-09-25 ENCOUNTER — Ambulatory Visit: Payer: Self-pay | Admitting: Internal Medicine

## 2009-09-26 ENCOUNTER — Encounter: Payer: Self-pay | Admitting: Internal Medicine

## 2009-10-18 IMAGING — CR DG CHEST 2V
1 series · 1 of 1 positions shown · non-contrast
Comparison: 06/18/2008

CLINICAL DATA: Urinary tract infection.  Chest pain.

CHEST - 2 VIEW

[view not recorded]
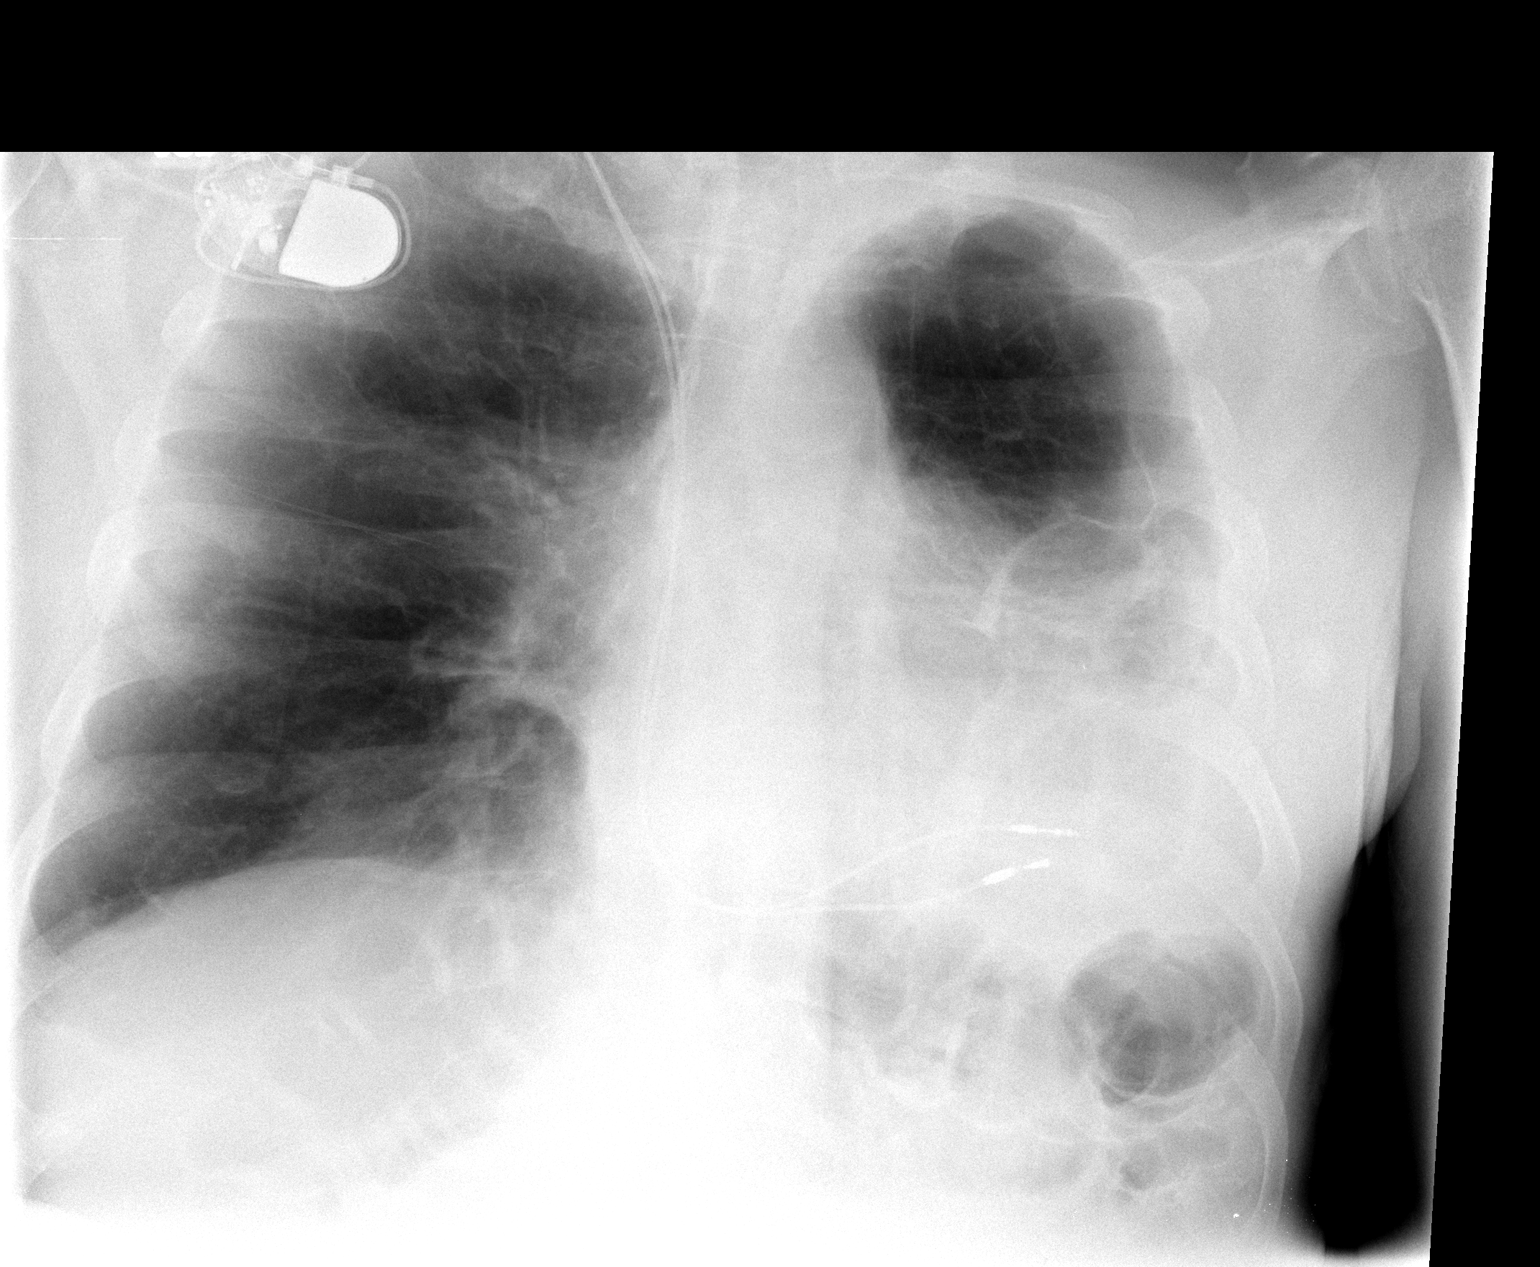

[1 of 1 positions shown; findings below may reference images not displayed]

FINDINGS: Pacer with leads at the right ventricle.  The frontal
view is suboptimal due to low lung volumes and likely AP technique.
Apical lordotic positioning.

Midline trachea. Moderate cardiomegaly. No pneumothorax.
Interstitial thickening is felt to be similar, given differences in
technique.  Opacity throughout the left hemithorax is likely due to
airspace disease and small left pleural effusion.
IMPRESSION: 1.  Decreased sensitivity and specificity exam due to technique
related factors, as described above.
2.  Cardiomegaly and low lung volumes without congestive failure.
3. Similar pleural parenchymal opacity in the left inferior
hemithorax.  Favor this being related to scarring and a small
amount of pleural fluid/thickening.  No convincing evidence of
acute superimposed process.

## 2009-10-18 IMAGING — CT CT ABDOMEN W/O CM
2 of 4 series · 17 of 46 positions shown, 19 images · IV contrast (water/omni  &)
Comparison: CT abdomen pelvis of 05/31/2008

CT ABDOMEN

CLINICAL DATA: Abdominal pain, nausea, vomiting, diarrhea

CT ABDOMEN AND PELVIS WITHOUT CONTRAST
TECHNIQUE: Multidetector CT imaging of the abdomen and pelvis was
performed following the standard protocol without intravenous
contrast.

[Series 2: routine abdomen · axial · 0.85mm/px · z∈[-507,-42]mm · 14 of 101 slices shown, 16 images]
[im 4/101  soft-tissue]
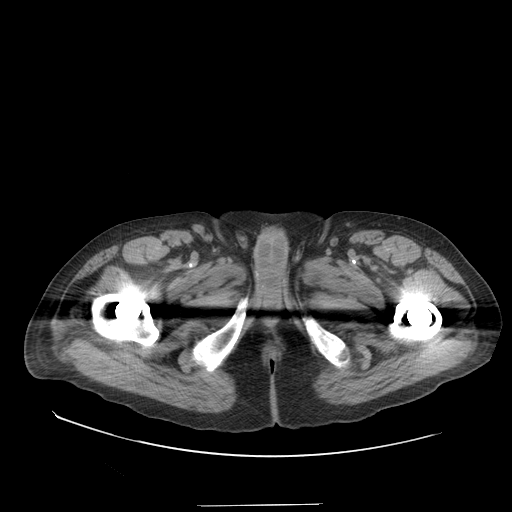
[im 4/101  bone]
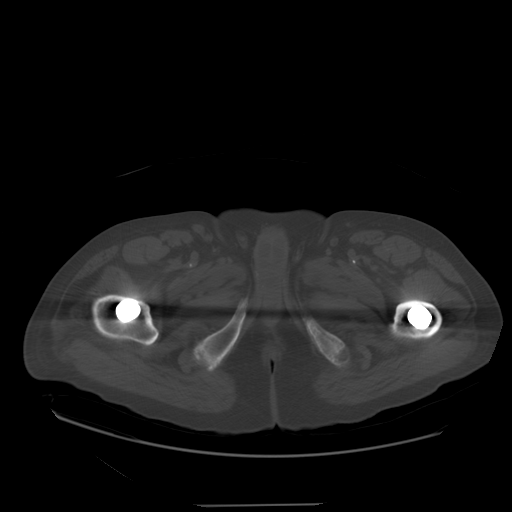
[im 12/101  soft-tissue]
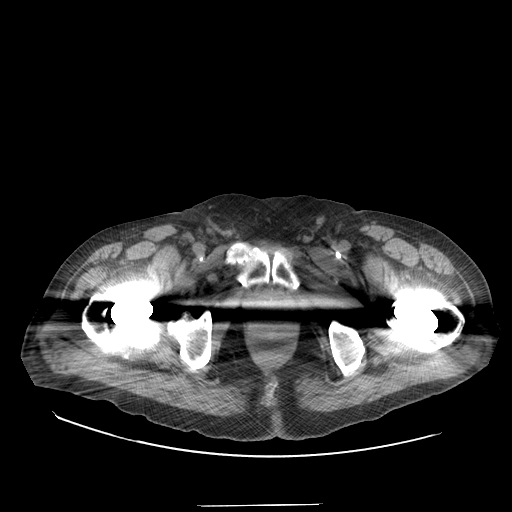
[im 20/101  soft-tissue]
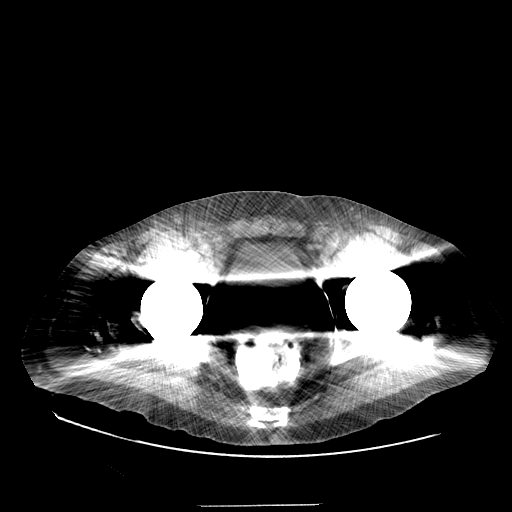
[im 27/101  soft-tissue]
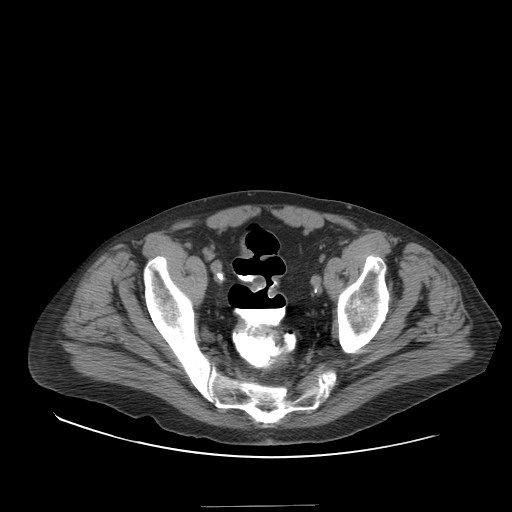
[im 35/101  soft-tissue]
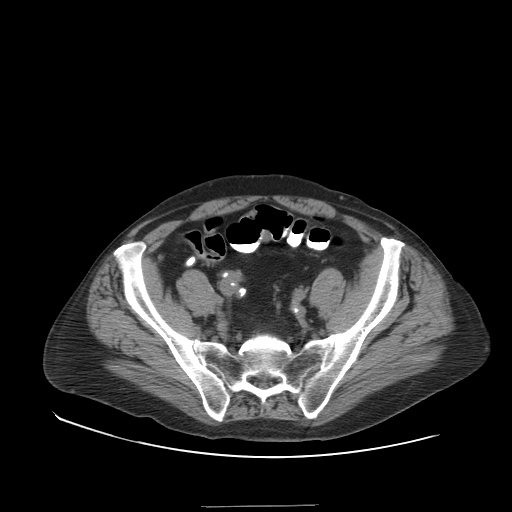
[im 39/101  soft-tissue]
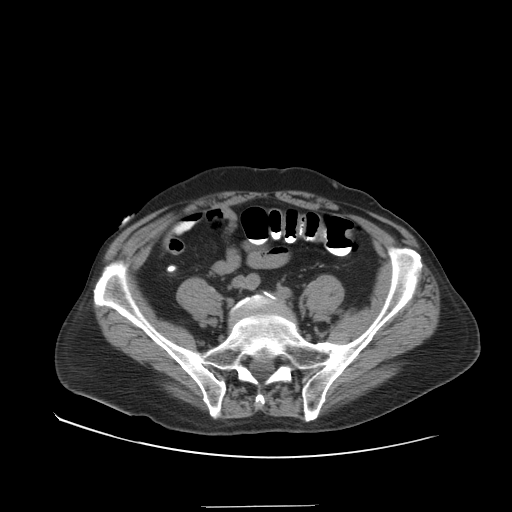
[im 47/101  soft-tissue]
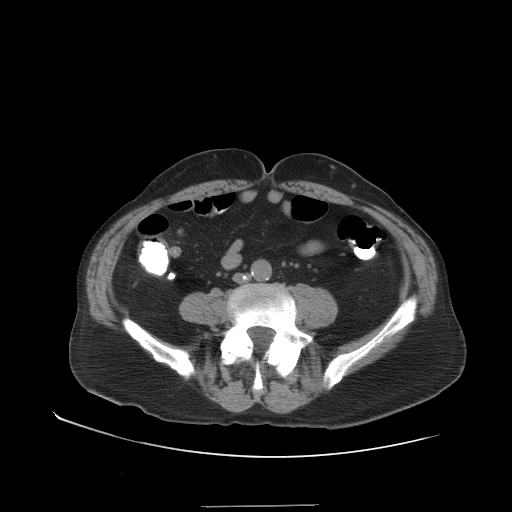
[im 54/101  soft-tissue]
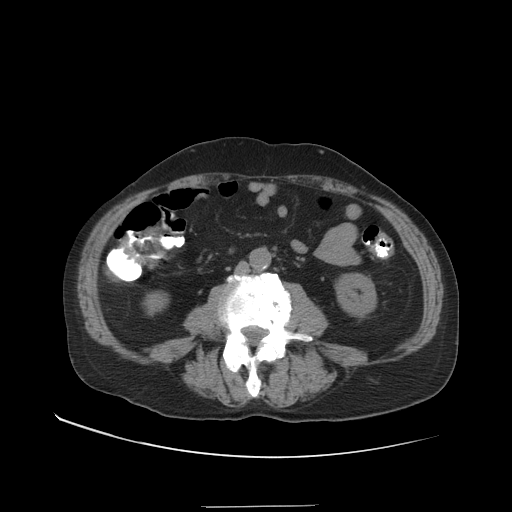
[im 62/101  soft-tissue]
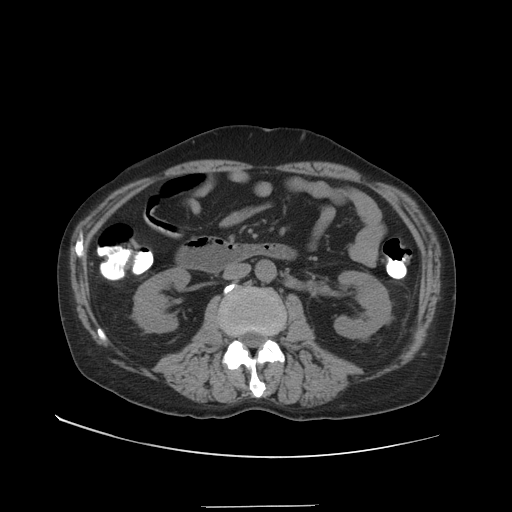
[im 62/101  bone]
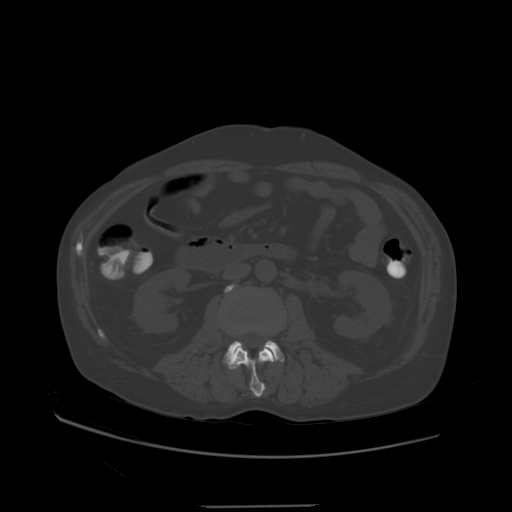
[im 66/101  soft-tissue]
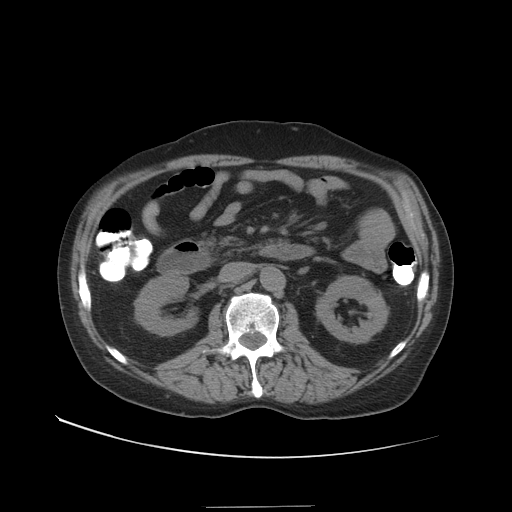
[im 74/101  soft-tissue]
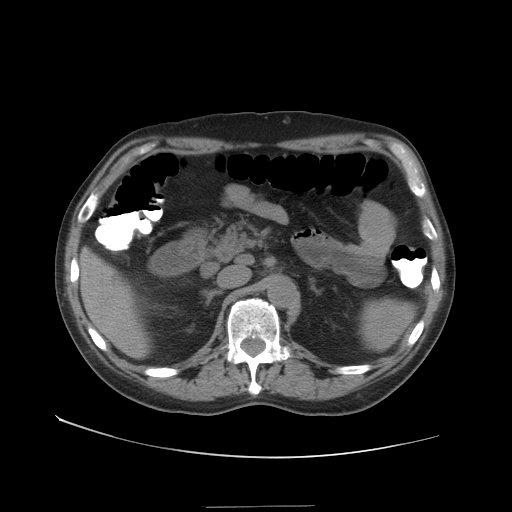
[im 81/101  soft-tissue]
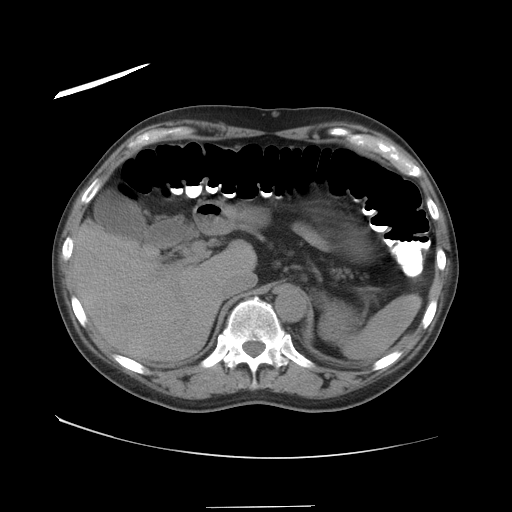
[im 89/101  soft-tissue]
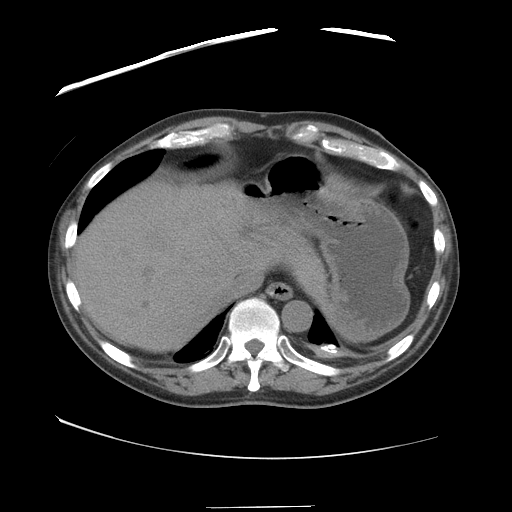
[im 97/101  soft-tissue]
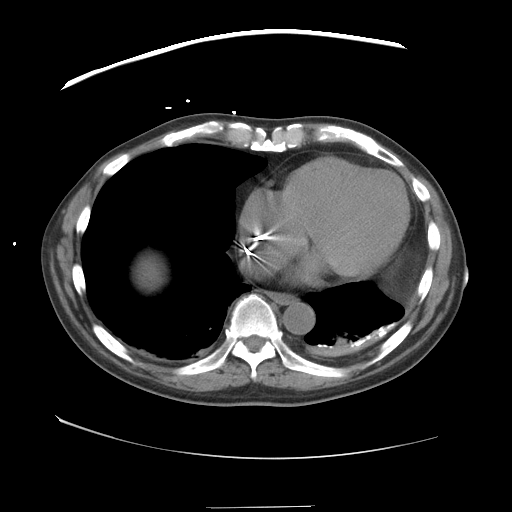

[Series 401: cor · coronal · 0.90mm/px · 3 of 83 slices shown]
[im 28/83  soft-tissue]
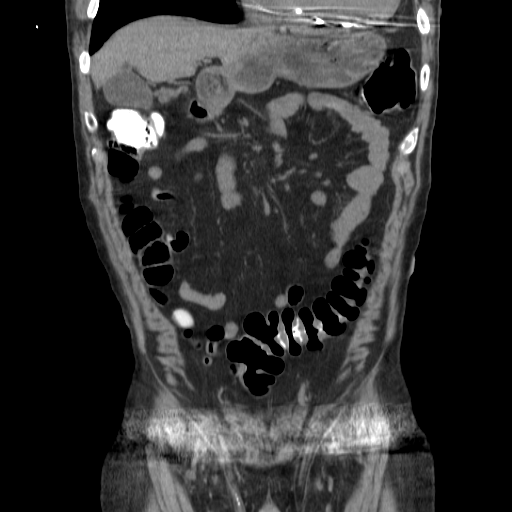
[im 37/83  soft-tissue]
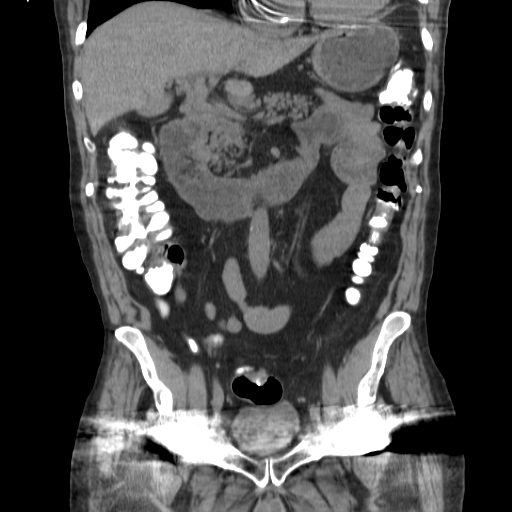
[im 46/83  soft-tissue]
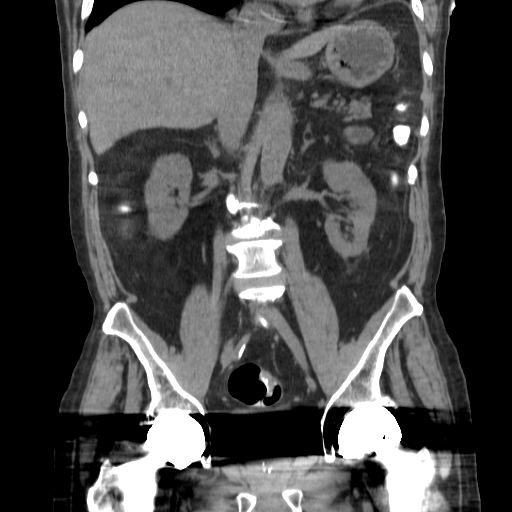

[17 of 46 positions shown; findings below may reference images not displayed]

FINDINGS: Scarring at the lung bases with a somewhat nodular
component in the  left lower lobe is stable.  Cardiomegaly is
stable.  The liver is unchanged in the unenhanced state with
several small low attenuation structures near the dome most likely
benign.  No calcified gallstones are seen although some higher
attenuation debris does layer within the gallbladder.  The pancreas
is normal in size and the pancreatic duct is not dilated.  The
adrenal glands and spleen are stable.  No renal calculi are seen
and there is no evidence of hydronephrosis.  The abdominal aorta is
normal in caliber.
IMPRESSION: No significant interval change on this unenhanced study.  No renal
calculi.  No bowel obstruction.

CT PELVIS
FINDINGS: The appendix fills with contrast in the right lower
quadrant and right pelvis and there is no CT evidence of
appendicitis.  No abnormality of the colon is seen.  Multiple
linear artifacts obscure detail within the pelvis as result of
bilateral hip replacements.  No pelvic mass, fluid, or adenopathy
is seen.  The terminal ileum appears normal.  Degenerative disc
disease is noted at L3-4.  There are pars defects at L5 without
malalignment.
IMPRESSION: 1.  The appendix and terminal ileum appear normal.
2.  Linear artifacts created by total hip replacements obscure
anatomic detail.  No definite mass, fluid, or adenopathy is seen.

## 2009-10-19 IMAGING — CR DG CHEST 1V PORT
1 series · 1 of 1 positions shown · non-contrast
Comparison: 07/26/2008.

CLINICAL DATA: Central line placement.

PORTABLE CHEST - 1 VIEW

[view not recorded]
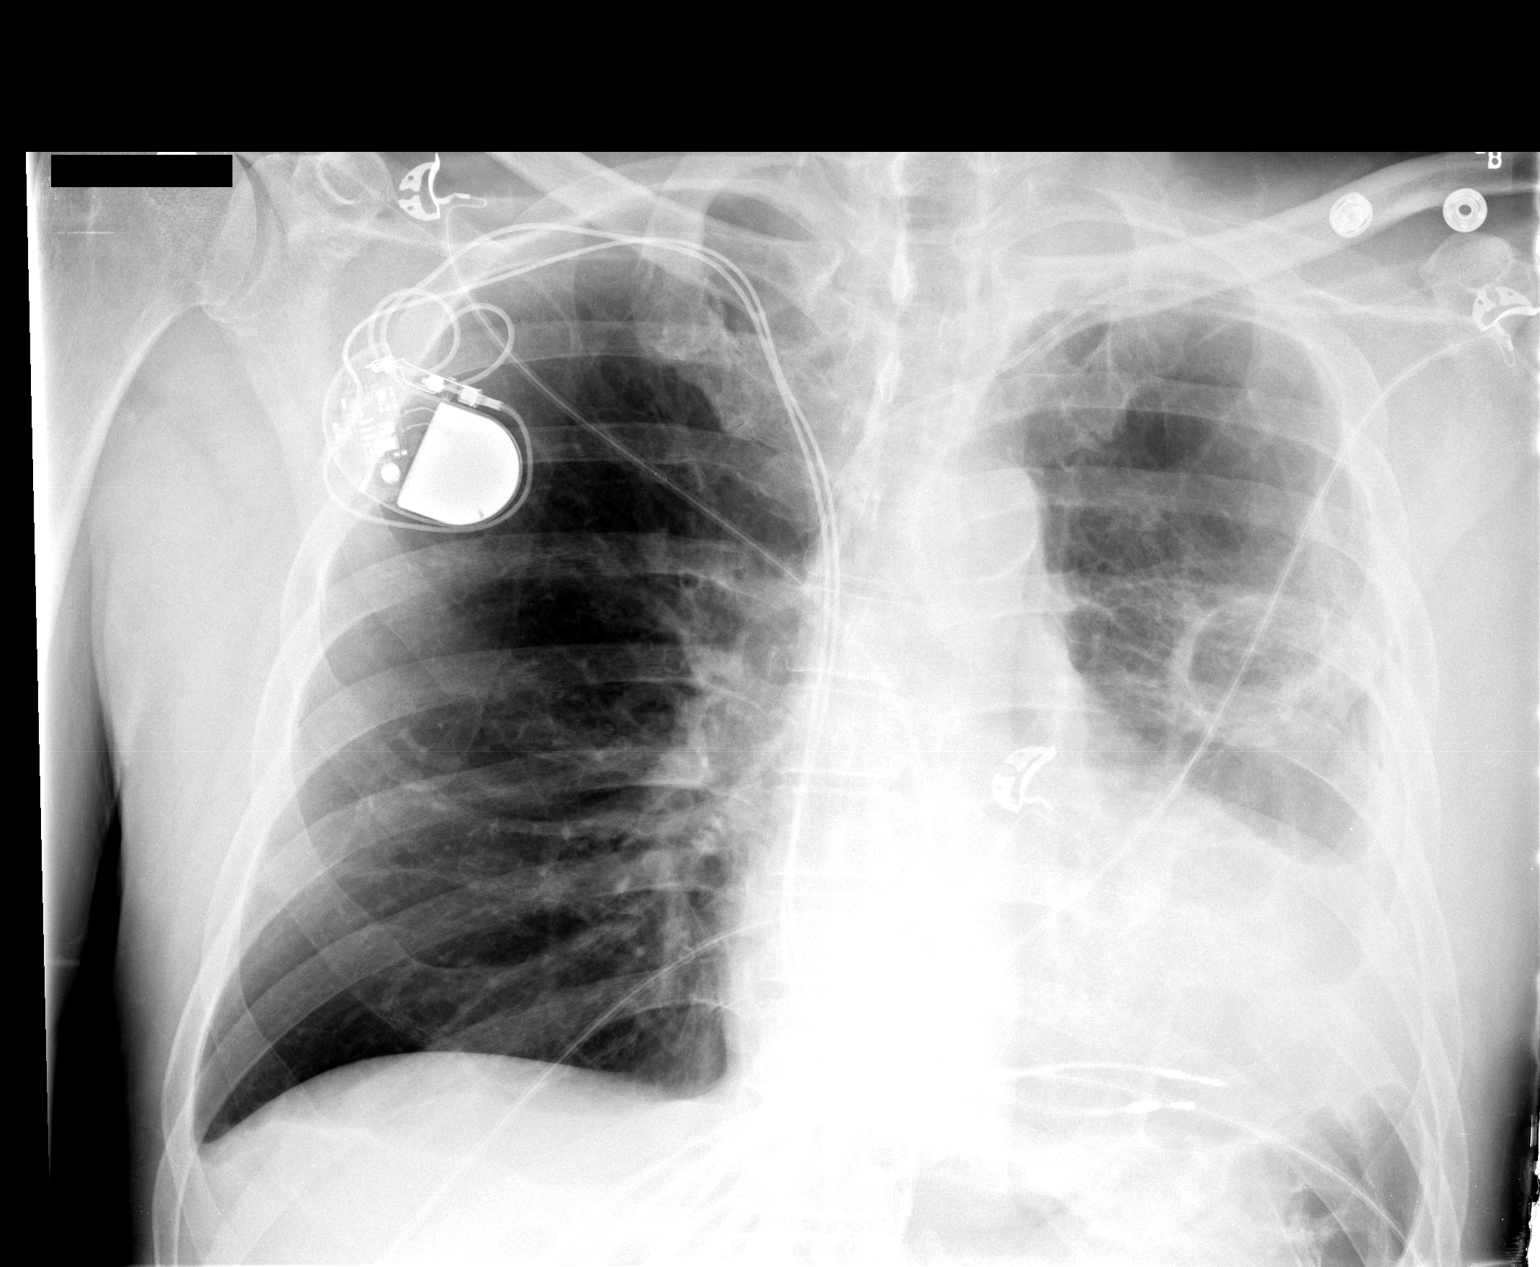

[1 of 1 positions shown; findings below may reference images not displayed]

FINDINGS: Left subclavian central venous catheter tip overlies the
pacemaker leads and appears to be within the SVC.  There is no
pneumothorax.

Chronic scarring in the left lung base is noted.  There is also
some left pleural effusion and left lower lobe airspace disease
which is unchanged from yesterday.  There is COPD with pulmonary
hyperinflation.
IMPRESSION: Satisfactory central line placement.

Left lower lobe airspace disease and left effusion are unchanged.

## 2009-10-23 ENCOUNTER — Encounter: Payer: Self-pay | Admitting: Pharmacist

## 2009-10-23 ENCOUNTER — Ambulatory Visit: Payer: Self-pay | Admitting: Internal Medicine

## 2009-10-23 ENCOUNTER — Telehealth (INDEPENDENT_AMBULATORY_CARE_PROVIDER_SITE_OTHER): Payer: Self-pay | Admitting: *Deleted

## 2009-10-23 LAB — CONVERTED CEMR LAB

## 2009-11-01 ENCOUNTER — Ambulatory Visit: Payer: Self-pay | Admitting: Internal Medicine

## 2009-11-03 ENCOUNTER — Telehealth: Payer: Self-pay | Admitting: Internal Medicine

## 2009-11-03 DIAGNOSIS — F1010 Alcohol abuse, uncomplicated: Secondary | ICD-10-CM | POA: Insufficient documentation

## 2009-11-03 DIAGNOSIS — J189 Pneumonia, unspecified organism: Secondary | ICD-10-CM

## 2009-11-06 ENCOUNTER — Encounter: Payer: Self-pay | Admitting: Licensed Clinical Social Worker

## 2009-11-08 IMAGING — US US RENAL PORT
1 series · 14 of 23 positions shown · non-contrast
Comparison: 07/26/2008 CT

CLINICAL DATA: Elevated BUN and creatinine.  Patient with
hypertension.

RENAL/URINARY TRACT ULTRASOUND COMPLETE

[Series 1: us renal port · 0.30mm/px · 14 of 23 slices shown]
[im 1/23]
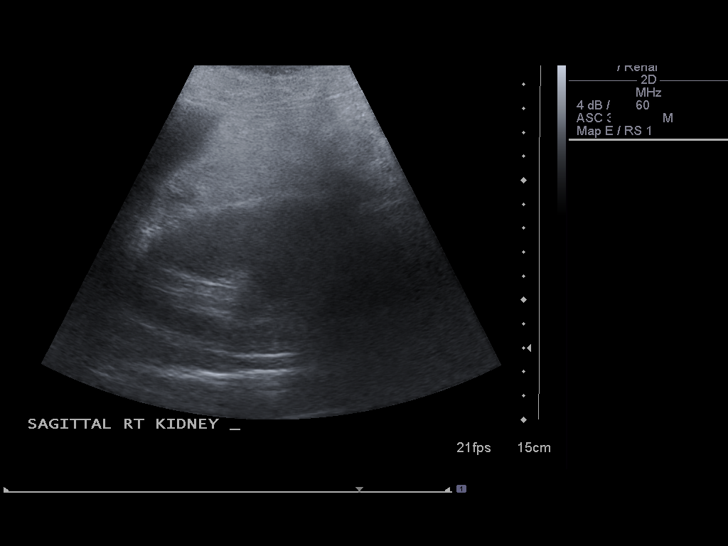
[im 3/23]
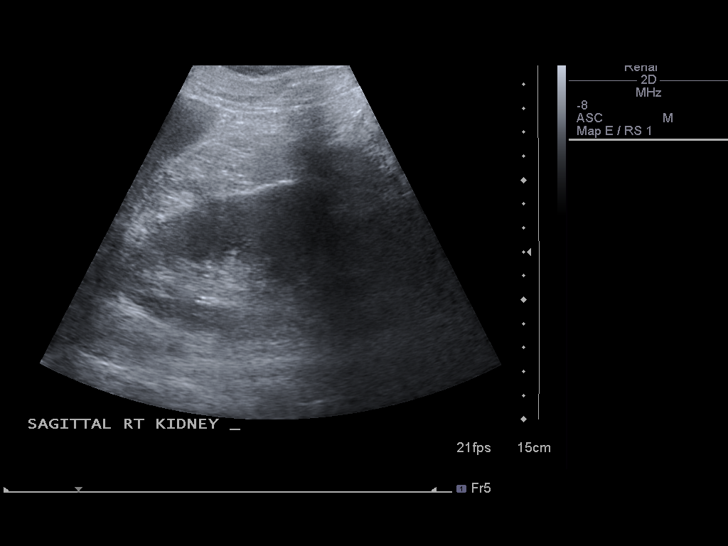
[im 5/23]
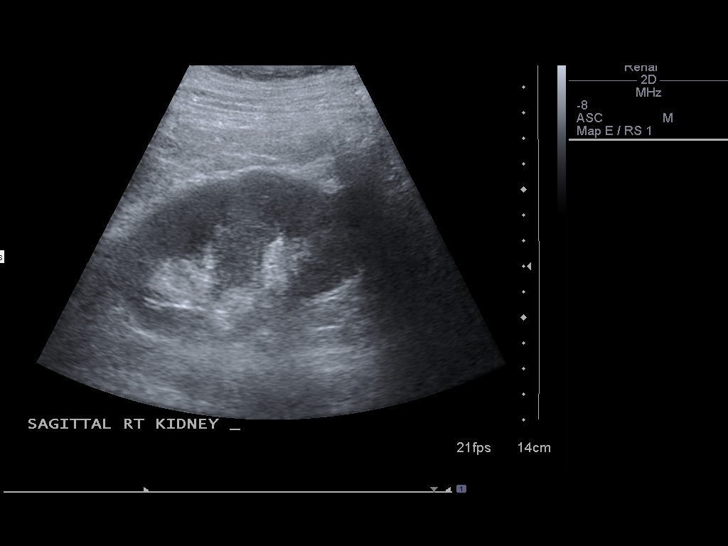
[im 6/23]
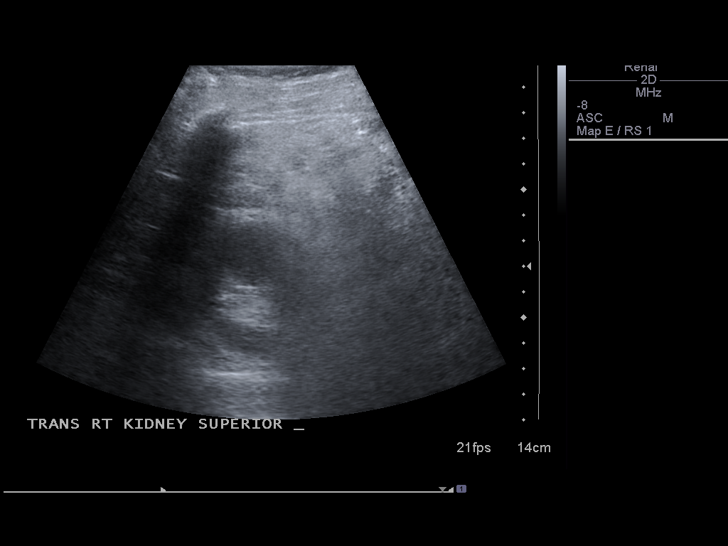
[im 8/23]
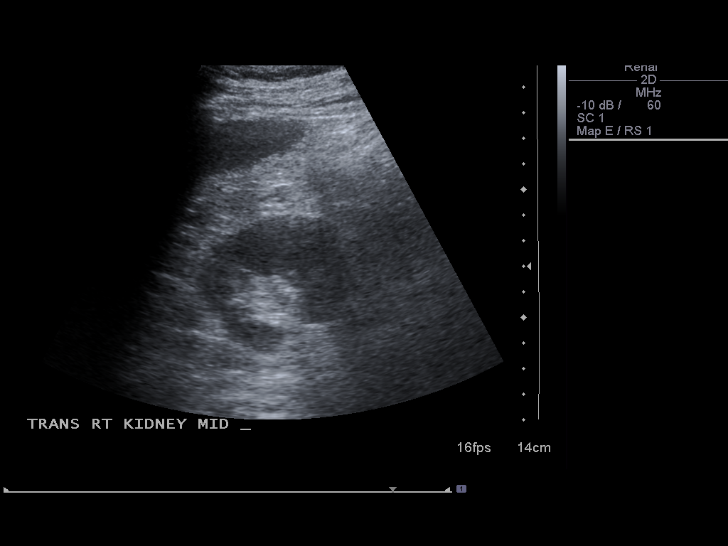
[im 10/23]
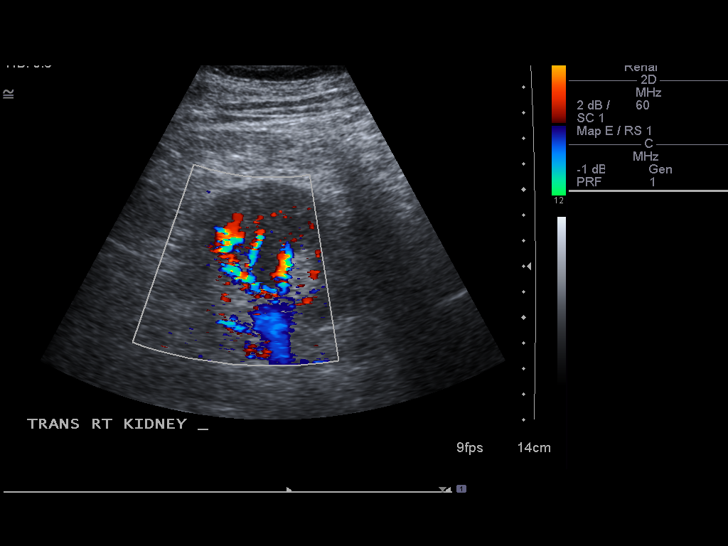
[im 11/23]
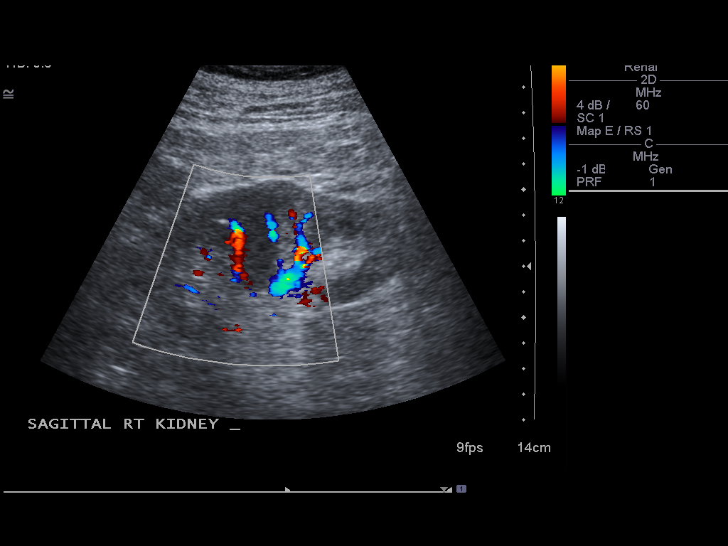
[im 13/23]
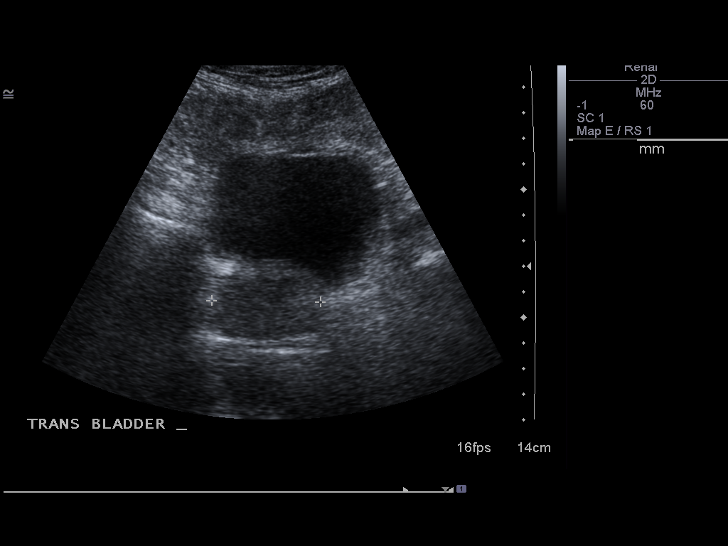
[im 14/23]
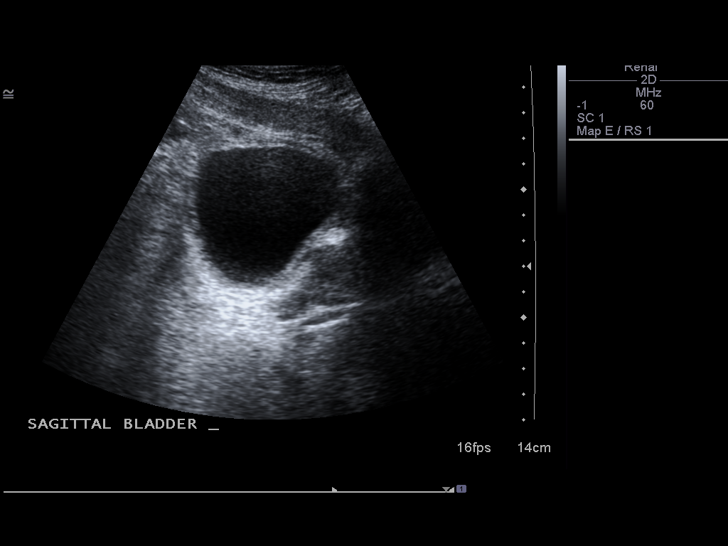
[im 16/23]
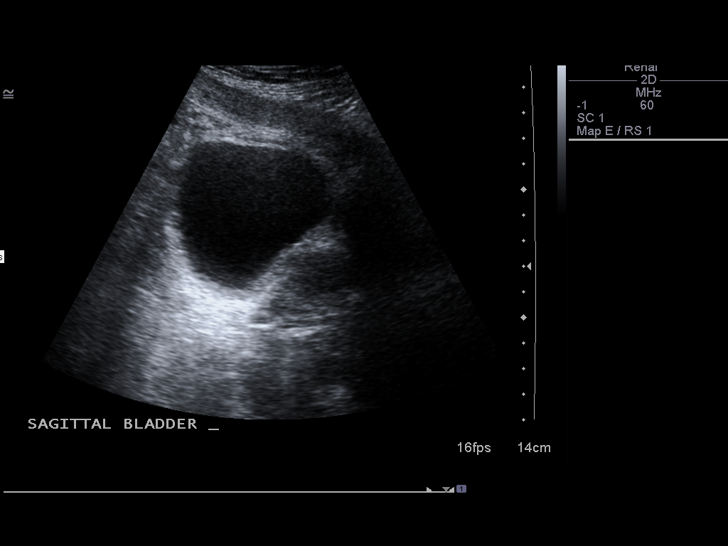
[im 18/23]
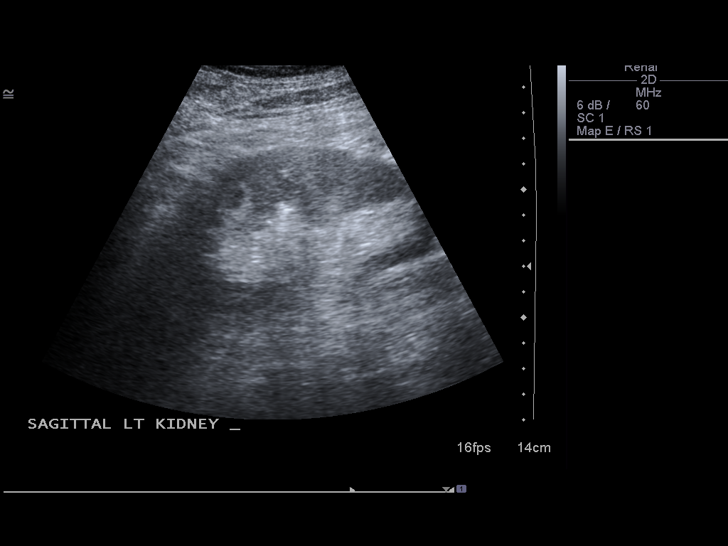
[im 19/23]
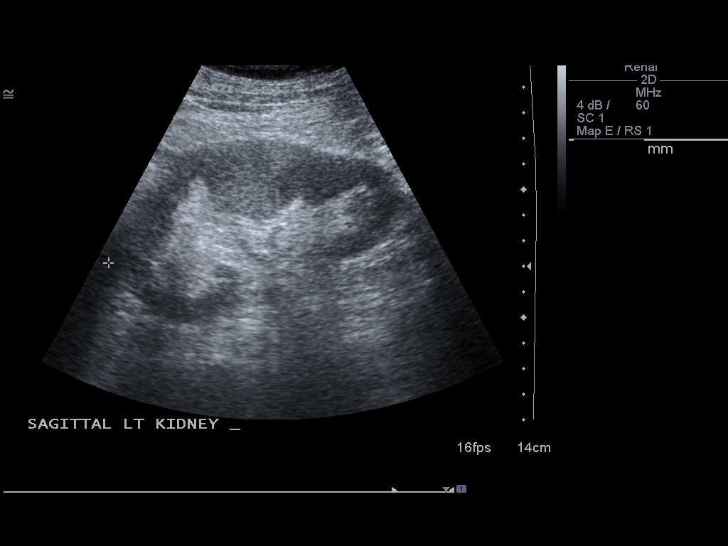
[im 21/23]
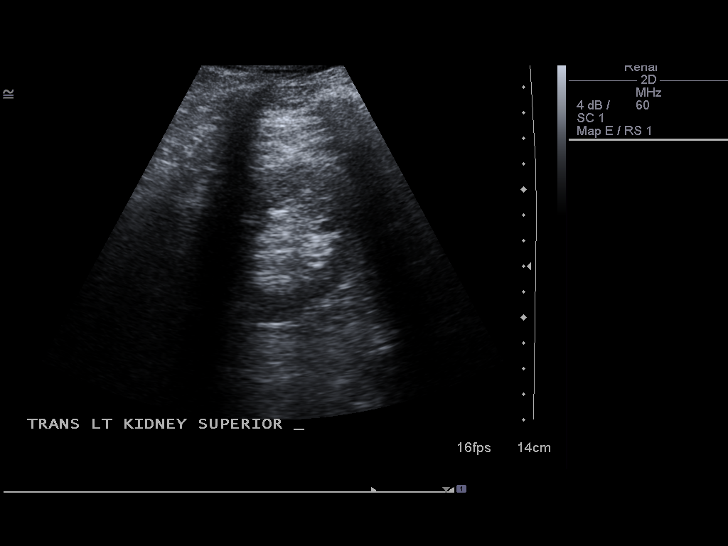
[im 23/23]
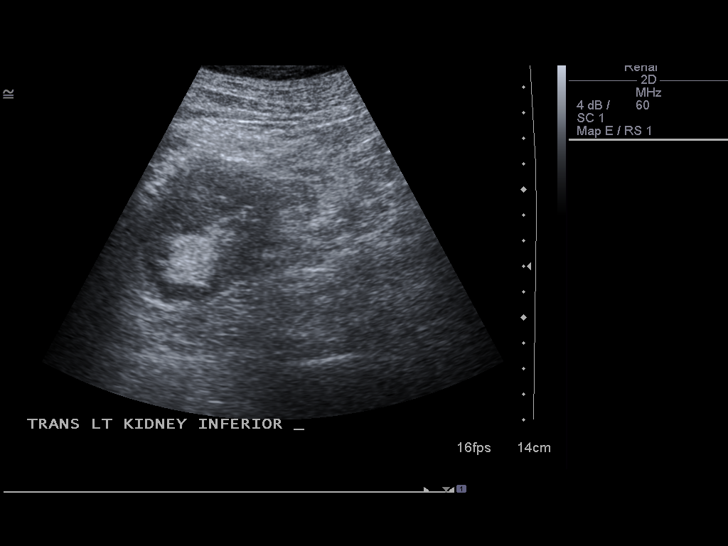

[14 of 23 positions shown; findings below may reference images not displayed]

FINDINGS: The kidneys bilaterally are normal in echogenicity and size.
The right kidney measures 11.1 cm and the left kidney measures 12
cm in greatest longitudinal dimension.
There is no evidence of hydronephrosis, solid renal masses, or
definite renal calculi.

The bladder is normal for degree of filling.
The prostate is upper limits of normal in size.
IMPRESSION: Unremarkable kidneys.  No evidence of hydronephrosis.

Upper limits normal sized prostate.

## 2009-11-09 IMAGING — CR DG CHEST 2V
1 series · 1 of 1 positions shown · non-contrast
Comparison: 08/15/2008, CT chest 10/17/2006 and chest x-ray
08/28/2006

CLINICAL DATA: Cough, shortness of breath and atrial fibrillation.

CHEST - 2 VIEW

[w chest lat]
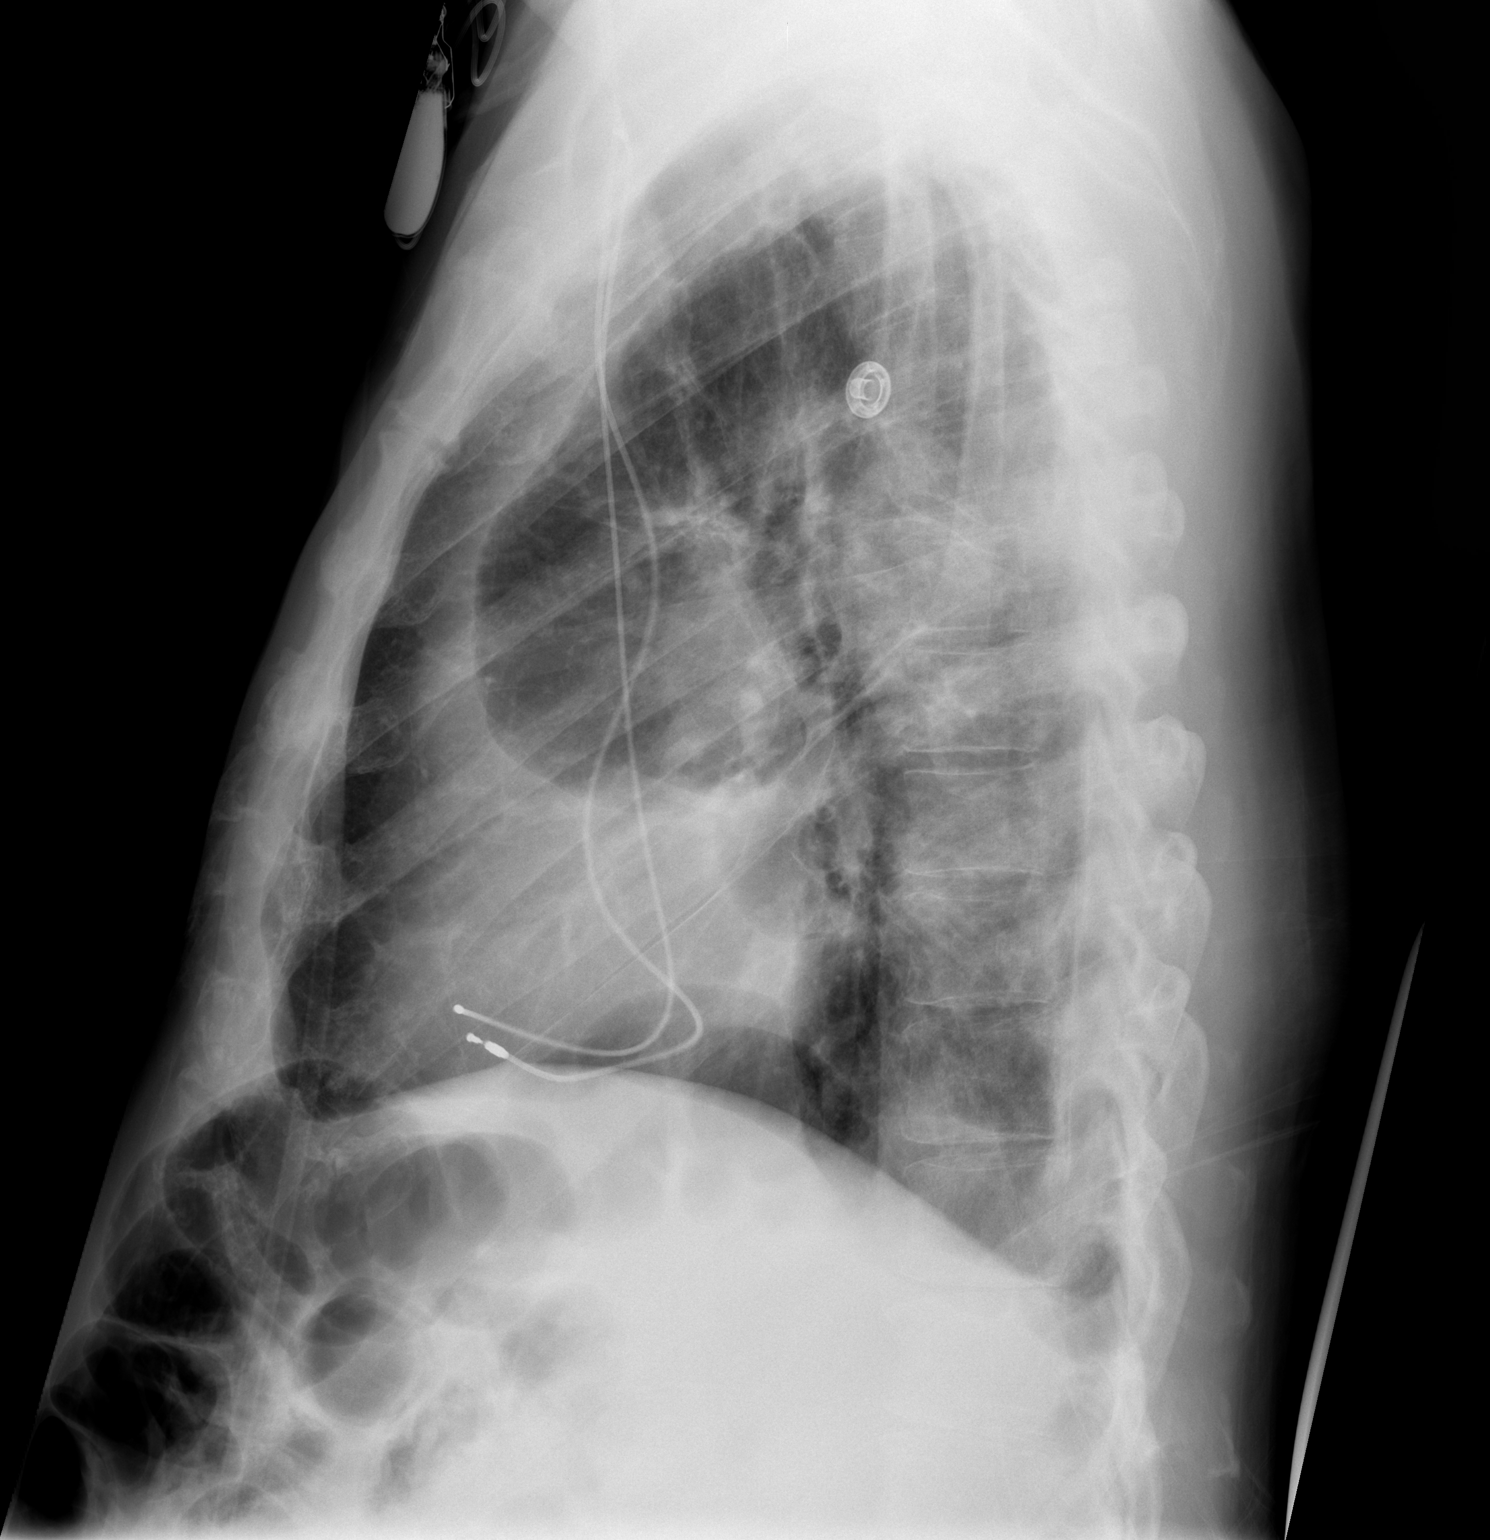

[1 of 1 positions shown; findings below may reference images not displayed]

FINDINGS: Support apparatus stable.  Trachea is midline.  Heart
size stable.  Thoracic aorta is calcified. When compared with CT
chest 10/17/2006 and chest x-ray 08/28/2006, there may be increased
air space opacification in the left mid lung zone, superimposed on
prominent pleural parenchymal scarring and volume loss. Left apical
pleural thickening.  Right lung is clear.
IMPRESSION: Question left mid lung zone airspace disease superimposed on
pleural parenchymal scarring.

## 2009-11-09 IMAGING — CR DG CHEST 1V PORT
1 series · 1 of 1 positions shown · non-contrast
Comparison: 08/17/2008

CLINICAL DATA: PICC placement.

PORTABLE CHEST - 1 VIEW

[view not recorded]
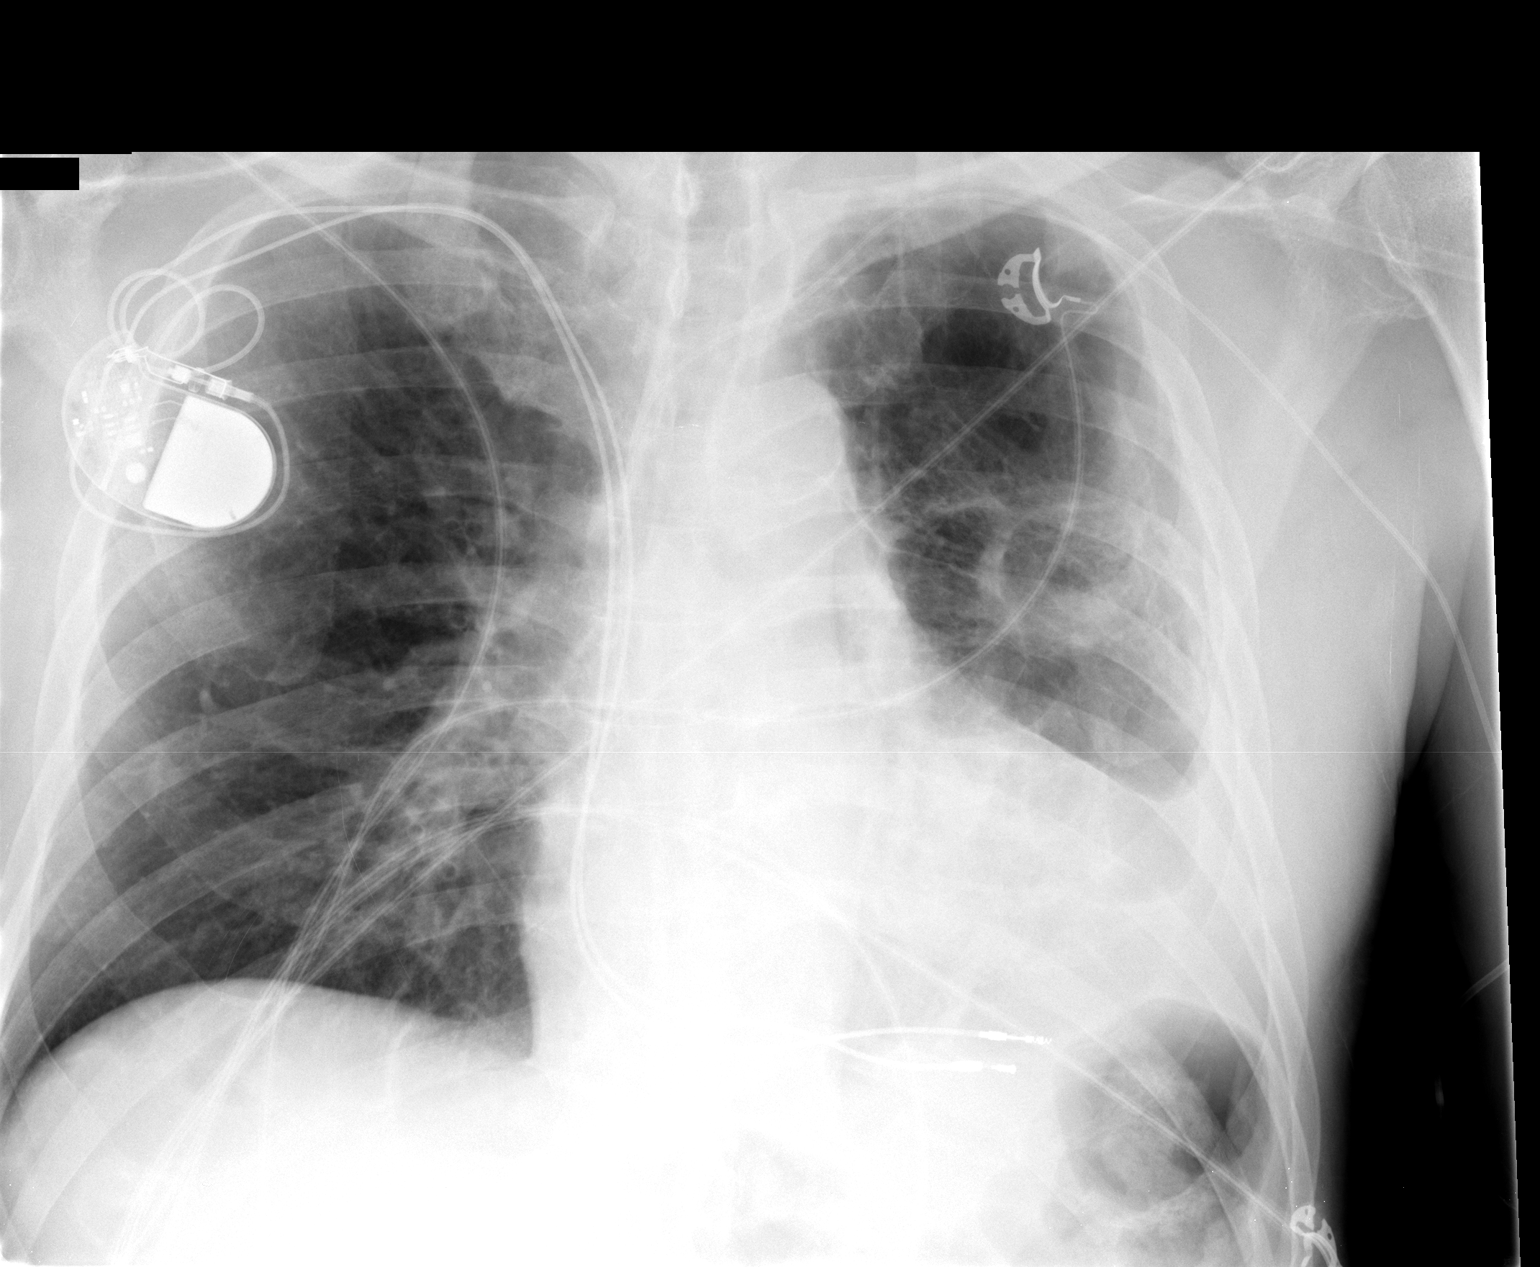

[1 of 1 positions shown; findings below may reference images not displayed]

FINDINGS: Left PICC tip projects over the SVC.  Heart is enlarged,
stable.  Pleural parenchymal scarring is again seen in the left
hemithorax.  There may be superimposed airspace disease in the left
midlung zone.  Right lung clear.
IMPRESSION: Chronic pleural parenchymal scarring in the left hemithorax.
Cannot exclude superimposed airspace disease in the left midlung
zone.

## 2009-11-14 ENCOUNTER — Telehealth: Payer: Self-pay | Admitting: *Deleted

## 2009-11-14 IMAGING — CR DG CHEST 2V
1 series · 1 of 1 positions shown · non-contrast
Comparison: 08/20/2008

CLINICAL DATA: Atrial fibrillation

CHEST - 2 VIEW

[view not recorded]
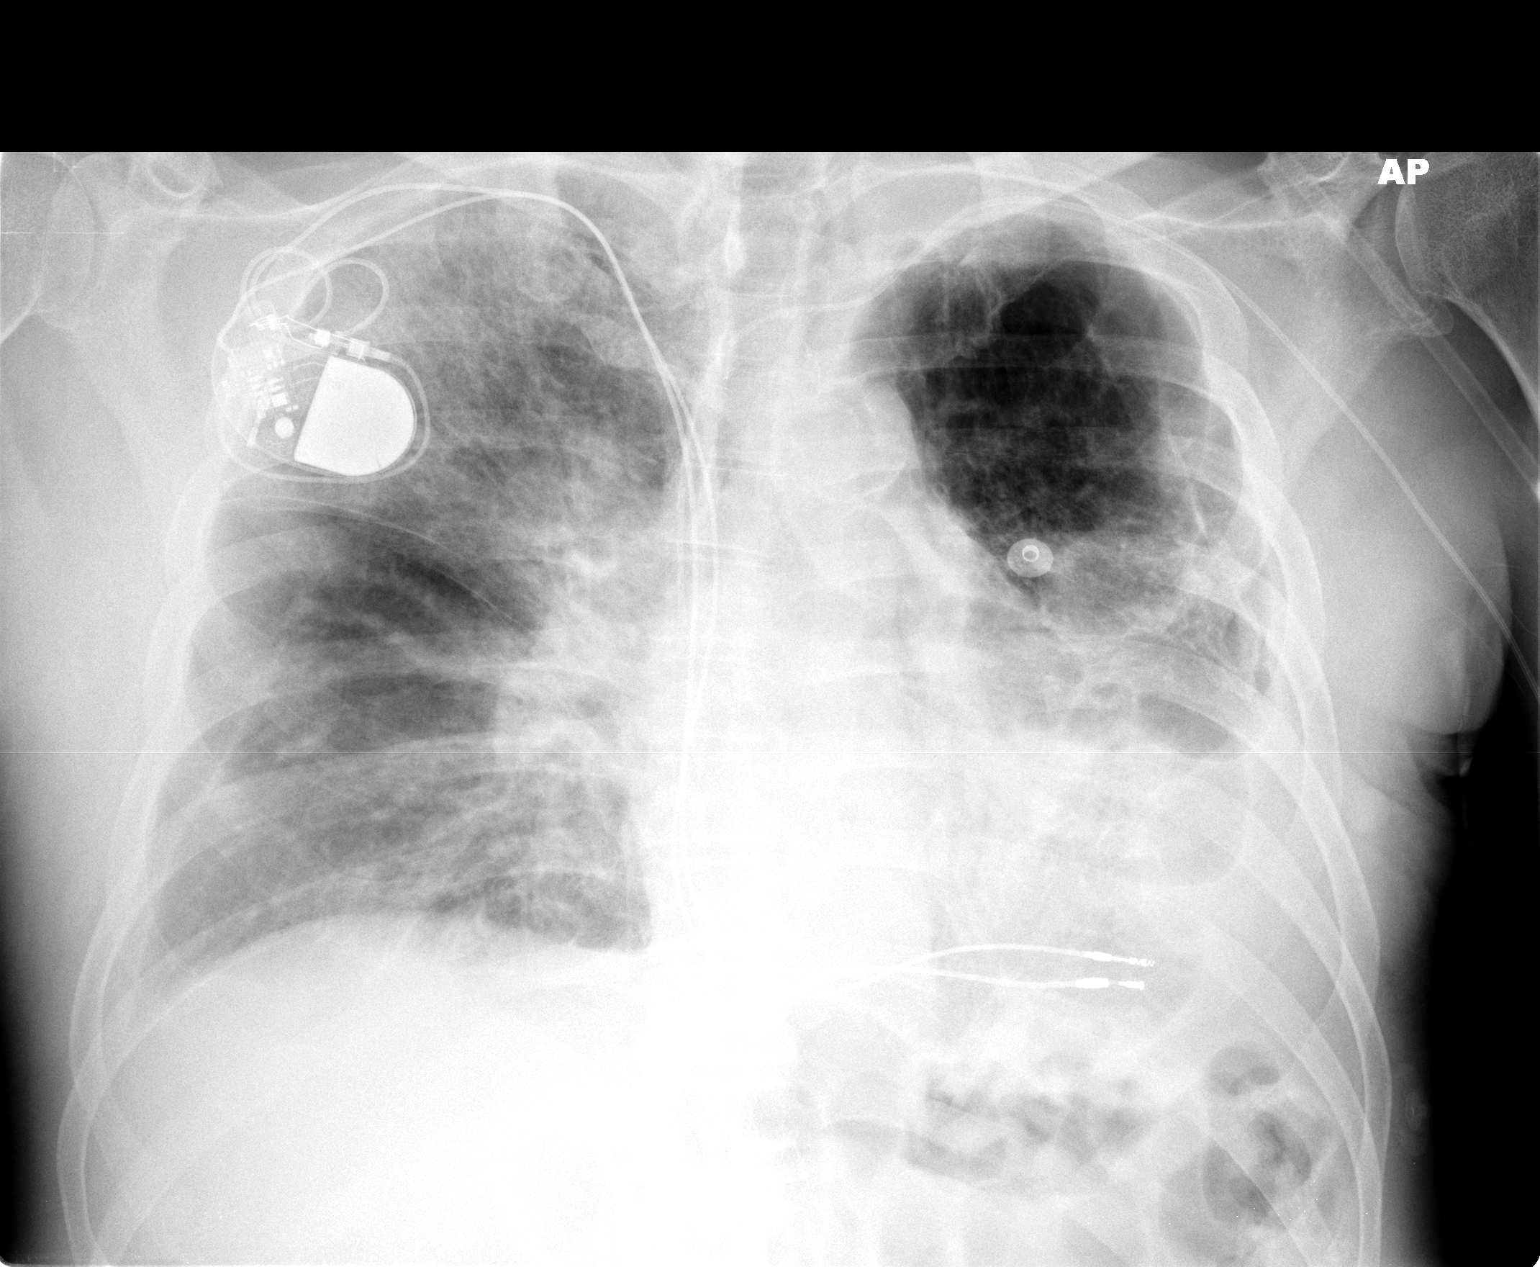

[1 of 1 positions shown; findings below may reference images not displayed]

FINDINGS: There is a right chest wall pacer device with leads in
the right ventricle.

The left arm PICC line tip is in the projection of the cavoatrial
junction.

Cardiac enlargement is stable.

Pulmonary edema and pleural effusions are again noted.  When
compared with the prior exam there has been increase in
interstitial edema.

Airspace disease within the right upper lobe and left base are
stable.
IMPRESSION: 1.  Suspect worsening CHF.

## 2009-11-15 ENCOUNTER — Telehealth: Payer: Self-pay | Admitting: Internal Medicine

## 2009-11-18 DIAGNOSIS — N189 Chronic kidney disease, unspecified: Secondary | ICD-10-CM

## 2009-11-18 DIAGNOSIS — N179 Acute kidney failure, unspecified: Secondary | ICD-10-CM | POA: Insufficient documentation

## 2009-11-18 LAB — CONVERTED CEMR LAB
CO2: 23 meq/L (ref 19–32)
Glucose, Bld: 97 mg/dL (ref 70–99)
Potassium: 5 meq/L (ref 3.5–5.3)
Sodium: 141 meq/L (ref 135–145)

## 2009-11-21 ENCOUNTER — Telehealth: Payer: Self-pay | Admitting: *Deleted

## 2009-11-21 ENCOUNTER — Encounter: Payer: Self-pay | Admitting: Internal Medicine

## 2009-11-27 ENCOUNTER — Telehealth: Payer: Self-pay | Admitting: Internal Medicine

## 2009-11-27 ENCOUNTER — Telehealth: Payer: Self-pay | Admitting: *Deleted

## 2009-11-27 ENCOUNTER — Ambulatory Visit: Payer: Self-pay | Admitting: Internal Medicine

## 2009-11-27 LAB — CONVERTED CEMR LAB: INR: 1.5

## 2009-11-28 ENCOUNTER — Encounter: Payer: Self-pay | Admitting: Internal Medicine

## 2009-11-28 LAB — CONVERTED CEMR LAB
CO2: 23 meq/L (ref 19–32)
Chloride: 106 meq/L (ref 96–112)
Potassium: 4.3 meq/L (ref 3.5–5.3)
Sodium: 141 meq/L (ref 135–145)

## 2009-12-04 ENCOUNTER — Ambulatory Visit: Payer: Self-pay | Admitting: Internal Medicine

## 2009-12-04 ENCOUNTER — Ambulatory Visit (HOSPITAL_COMMUNITY): Admission: RE | Admit: 2009-12-04 | Discharge: 2009-12-04 | Payer: Self-pay | Admitting: Internal Medicine

## 2009-12-04 LAB — CONVERTED CEMR LAB
Albumin, U: DETECTED %
Alpha 1, Urine: DETECTED % — AB
Anti Nuclear Antibody(ANA): NEGATIVE
Beta Globulin: 5.7 % (ref 4.7–7.2)
Complement C4, Body Fluid: 25 mg/dL (ref 16–47)
Gamma Globulin, Urine: DETECTED % — AB
Protein, ur: NEGATIVE mg/dL
Sodium, Ur: 123 meq/L
Total Protein, Serum Electrophoresis: 6.9 g/dL (ref 6.0–8.3)
Urine Glucose: NEGATIVE mg/dL
pH: 5.5 (ref 5.0–8.0)

## 2009-12-12 ENCOUNTER — Ambulatory Visit: Payer: Self-pay | Admitting: Internal Medicine

## 2009-12-12 LAB — CONVERTED CEMR LAB
CO2: 22 meq/L (ref 19–32)
Calcium: 9.5 mg/dL (ref 8.4–10.5)
Chloride: 110 meq/L (ref 96–112)
Glucose, Bld: 87 mg/dL (ref 70–99)
Potassium: 4.7 meq/L (ref 3.5–5.3)
Sodium: 143 meq/L (ref 135–145)

## 2009-12-16 IMAGING — CT CT PELVIS W/ CM
2 of 5 series · 13 of 32 positions shown, 18 images · IV contrast (agent unspecified)
Comparison: 07/26/2008

CT ABDOMEN

CLINICAL DATA: Abdominal pain and nausea.

CT ABDOMEN AND PELVIS WITH CONTRAST
TECHNIQUE: Multidetector CT imaging of the abdomen and pelvis was
performed using the standard protocol following bolus
administration of intravenous contrast.
Contrast: 100 ml of Imnipaque-SDD

[Series 2: routine abdomen · axial · 0.81mm/px · z∈[-487,-122]mm · 7 of 99 slices shown, 12 images]
[im 13/99  soft-tissue]
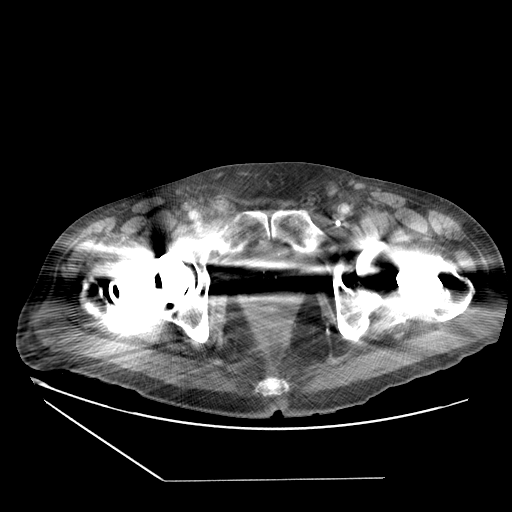
[im 13/99  bone]
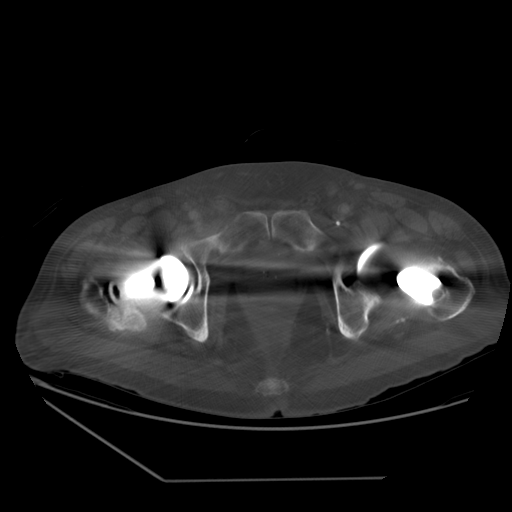
[im 25/99  soft-tissue]
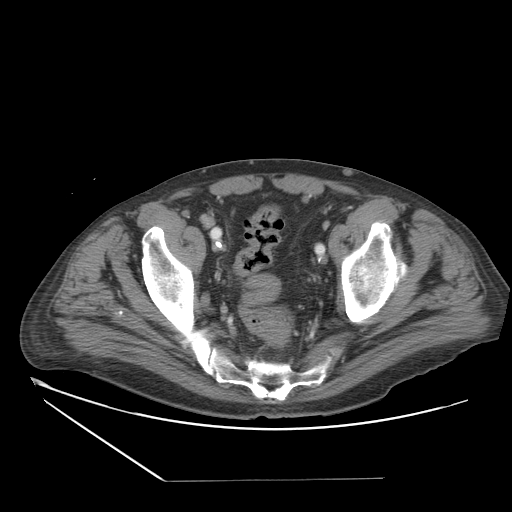
[im 37/99  soft-tissue]
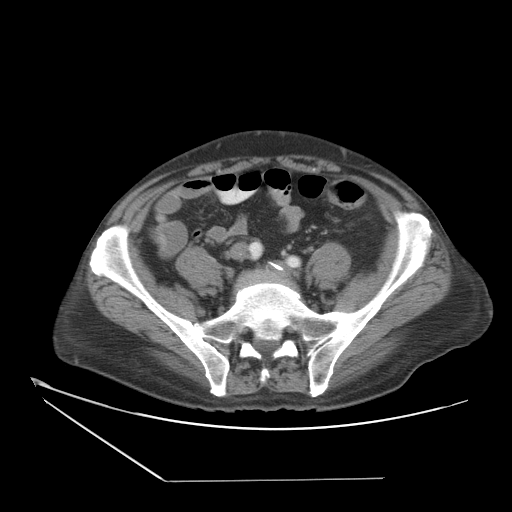
[im 50/99  soft-tissue]
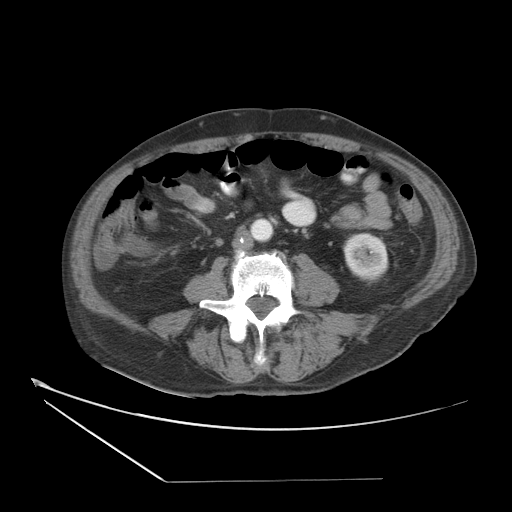
[im 50/99  lung]
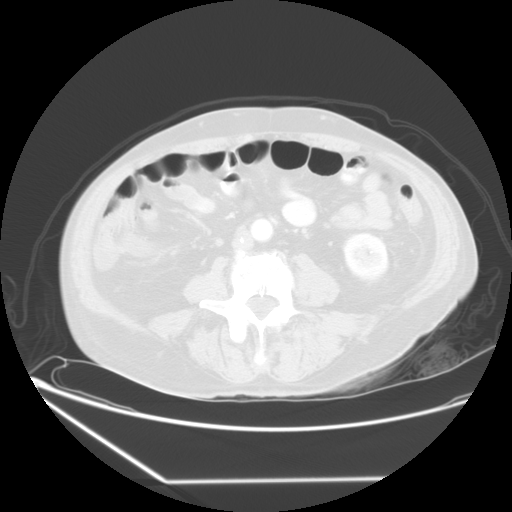
[im 62/99  soft-tissue]
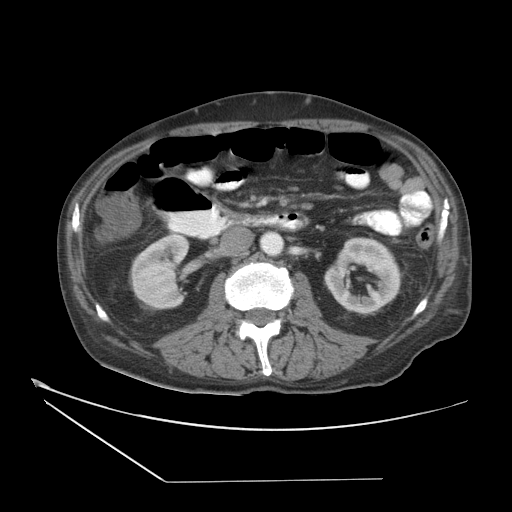
[im 62/99  lung]
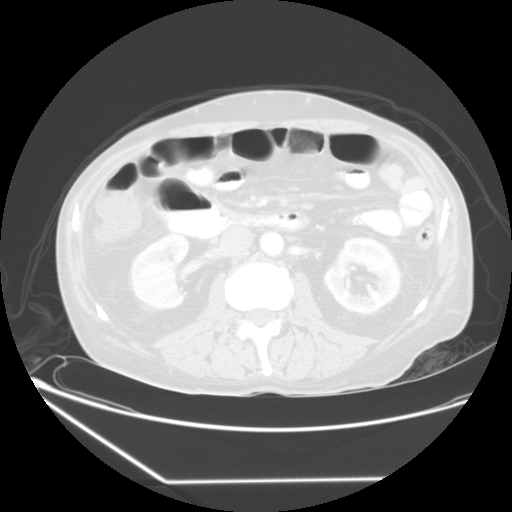
[im 74/99  soft-tissue]
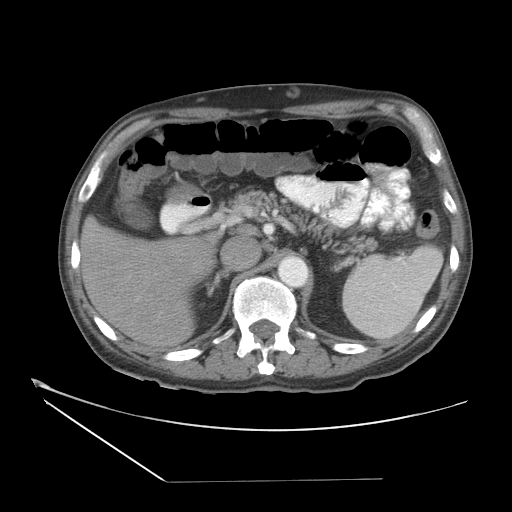
[im 74/99  lung]
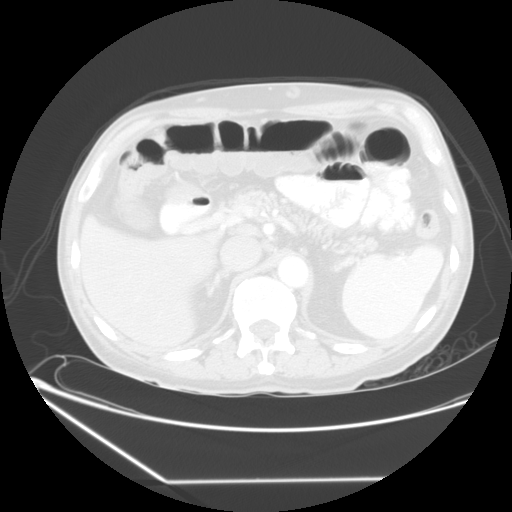
[im 86/99  soft-tissue]
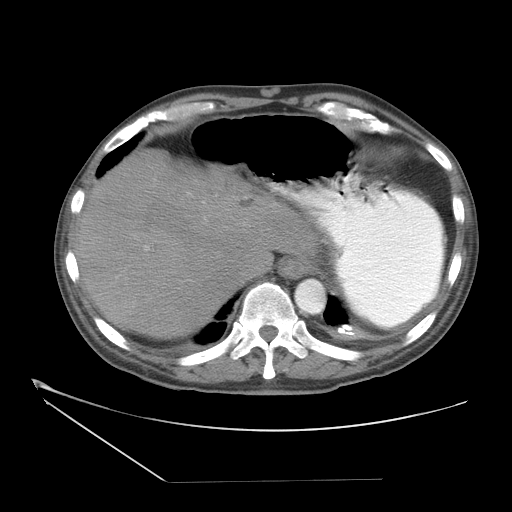
[im 86/99  lung]
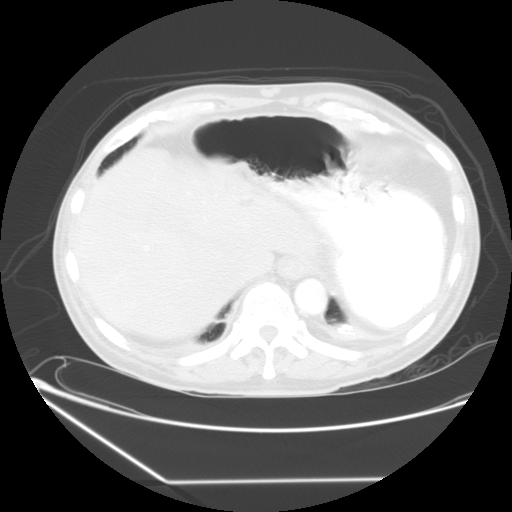

[Series 401: sag · sagittal · 0.90mm/px · 6 of 116 slices shown]
[im 13/116  soft-tissue]
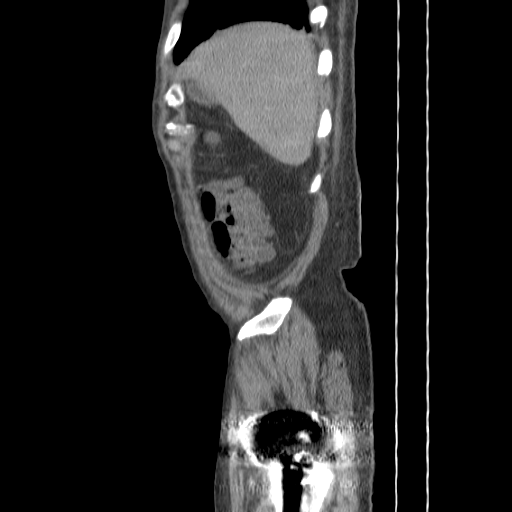
[im 26/116  soft-tissue]
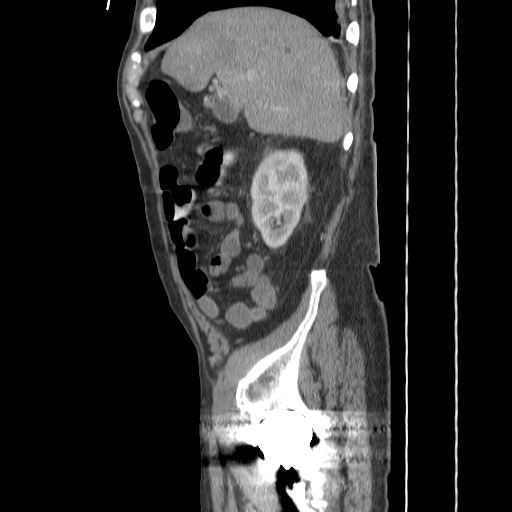
[im 39/116  soft-tissue]
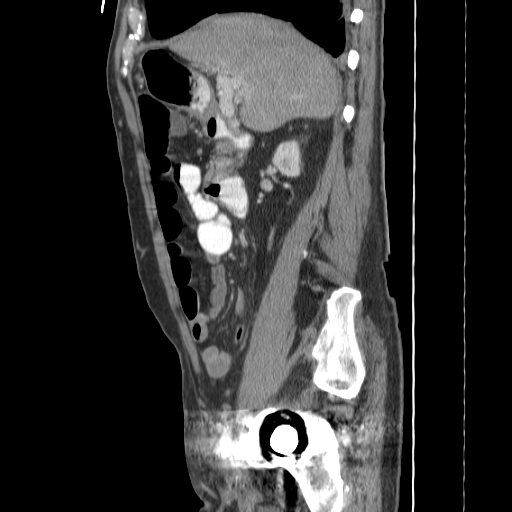
[im 52/116  soft-tissue]
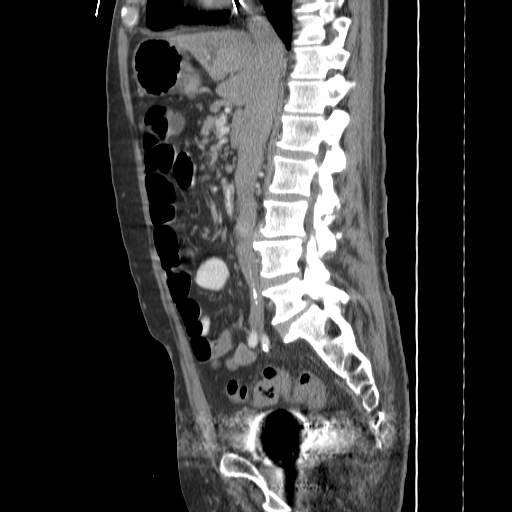
[im 64/116  soft-tissue]
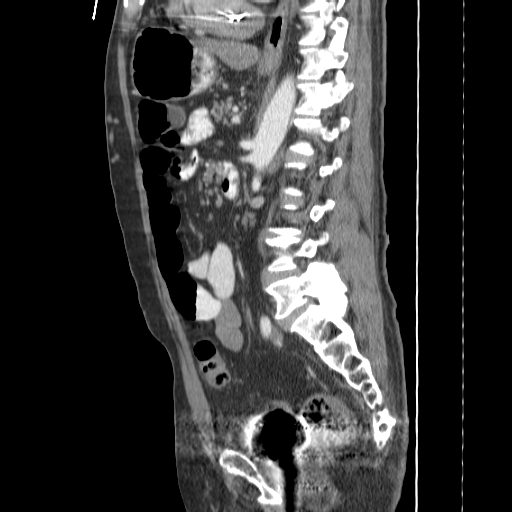
[im 77/116  soft-tissue]
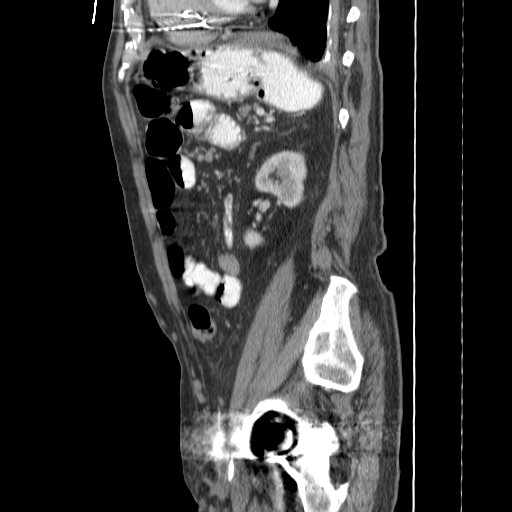

[13 of 32 positions shown; findings below may reference images not displayed]

FINDINGS: The lung bases demonstrate persistent pleural
calcifications and thickening.  There is a persistent area of
rounded atelectasis at the left lung base.  The heart is mildly
enlarged but stable.

Stable low attenuation liver lesions.  Probable geographic fatty
infiltration of the liver.  There is cavernous transformation of
the portal vein.  The splenic vein is patent.  The spleen is normal
in size.  The pancreas is unremarkable.  Mild fatty changes are
stable.  Stable periportal and celiac axis lymph nodes.  The
gallbladder is unremarkable.  The adrenal glands and kidneys
demonstrate no significant abnormalities.

The stomach, duodenum, small bowel and colon demonstrate no
significant findings.  The aorta is normal in caliber.  The bony
structures are stable.
IMPRESSION: 1.  Calcified areas of pleural thickening at the left lung base and
persistent area of rounded atelectasis.  There are also bibasilar
scarring changes.
2.  Geographic fatty infiltration of the liver.
3.  Stable small low attenuation liver lesions which are likely
cysts.
4.  Chronic cavernous transformation of the portal vein.
5.  Stable periportal and celiac axis lymph nodes.
6.  No acute abdominal findings or mass lesions.

CT PELVIS
FINDINGS: The rectum, sigmoid colon visualized small bowel loops
are grossly normal.  The appendix is visualized and is normal.
Stable calcifications are noted in the lower IVC and in the iliac
veins.  This may be a calcified thrombus.

There is significant artifact in the lower pelvis because of
bilateral hip prosthesis.  Scattered sigmoid diverticulosis.  No
pelvic mass or adenopathy.  No free pelvic fluid collections.  The
bony pelvis is grossly intact.
IMPRESSION: 1.  Chronic linear calcifications in the IVC and iliac veins may be
calcified thrombus.
2.  Mild diverticulosis of the sigmoid colon.
3.  Normal CT appearance of appendix.
4.  Significant artifact from the lower pelvis due to a hip
prosthesis.

## 2009-12-18 IMAGING — CR DG ABDOMEN ACUTE W/ 1V CHEST
3 series · 3 of 3 positions shown · non-contrast
Comparison: CT abdomen pelvis of 09/23/2008 and chest x-ray of
08/22/2008

CLINICAL DATA: Abdominal pain, chills, diarrhea

ACUTE ABDOMEN SERIES (ABDOMEN 2 VIEW & CHEST 1 VIEW)

[w chest pa]
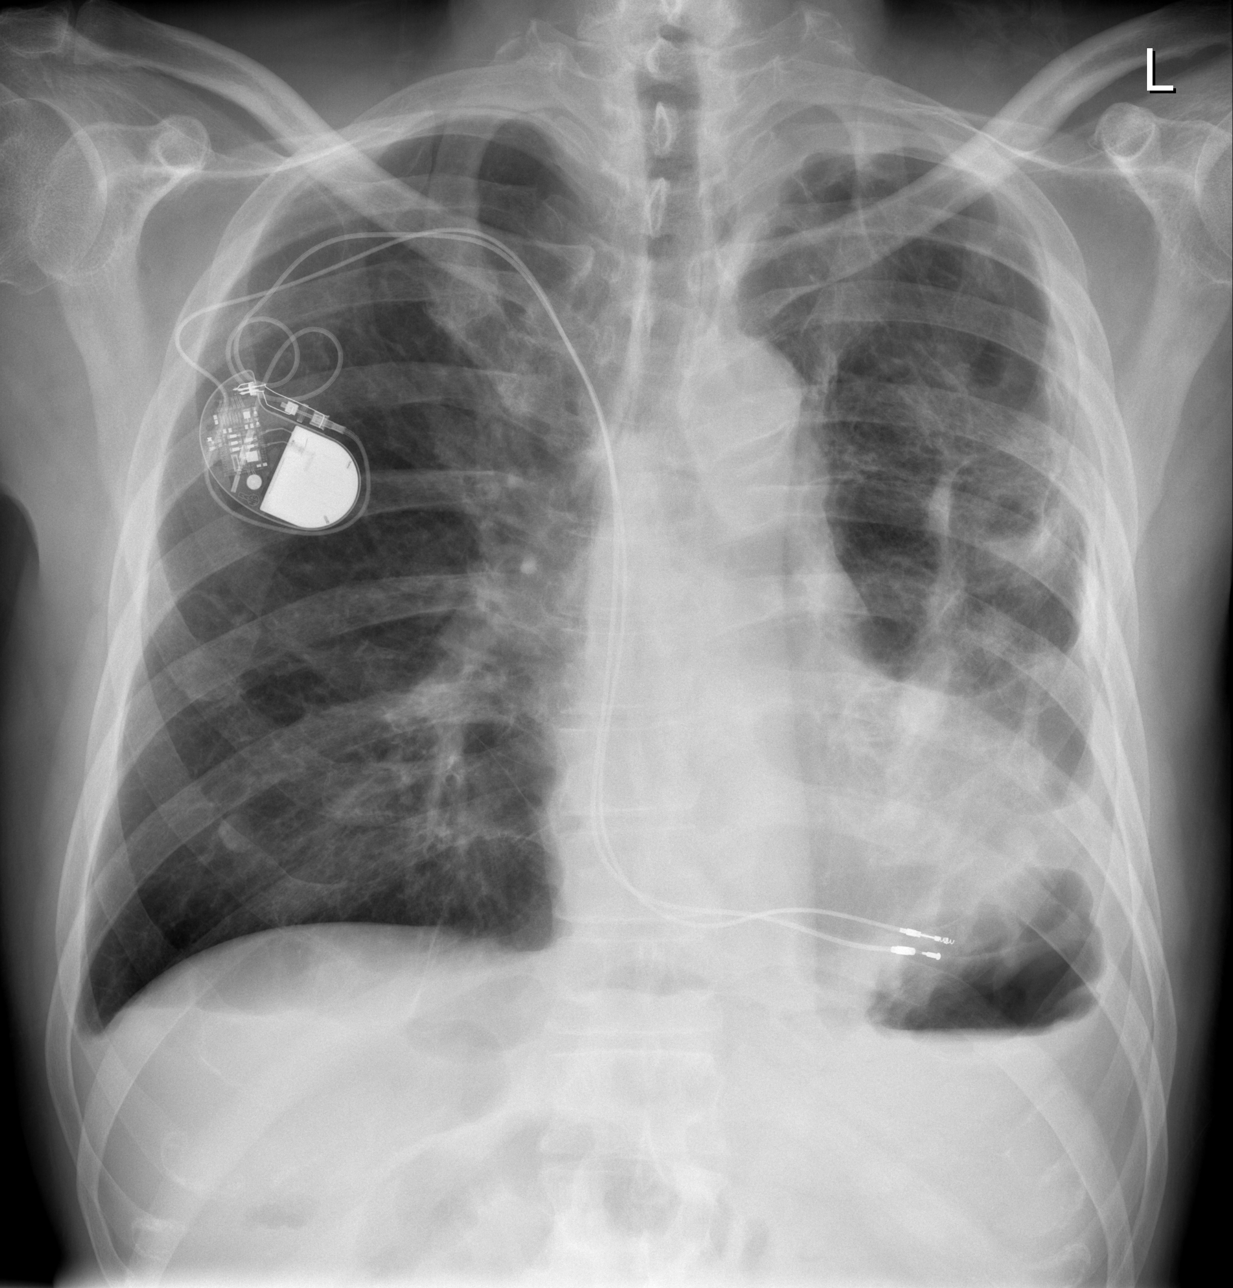

[w abdomen upright]
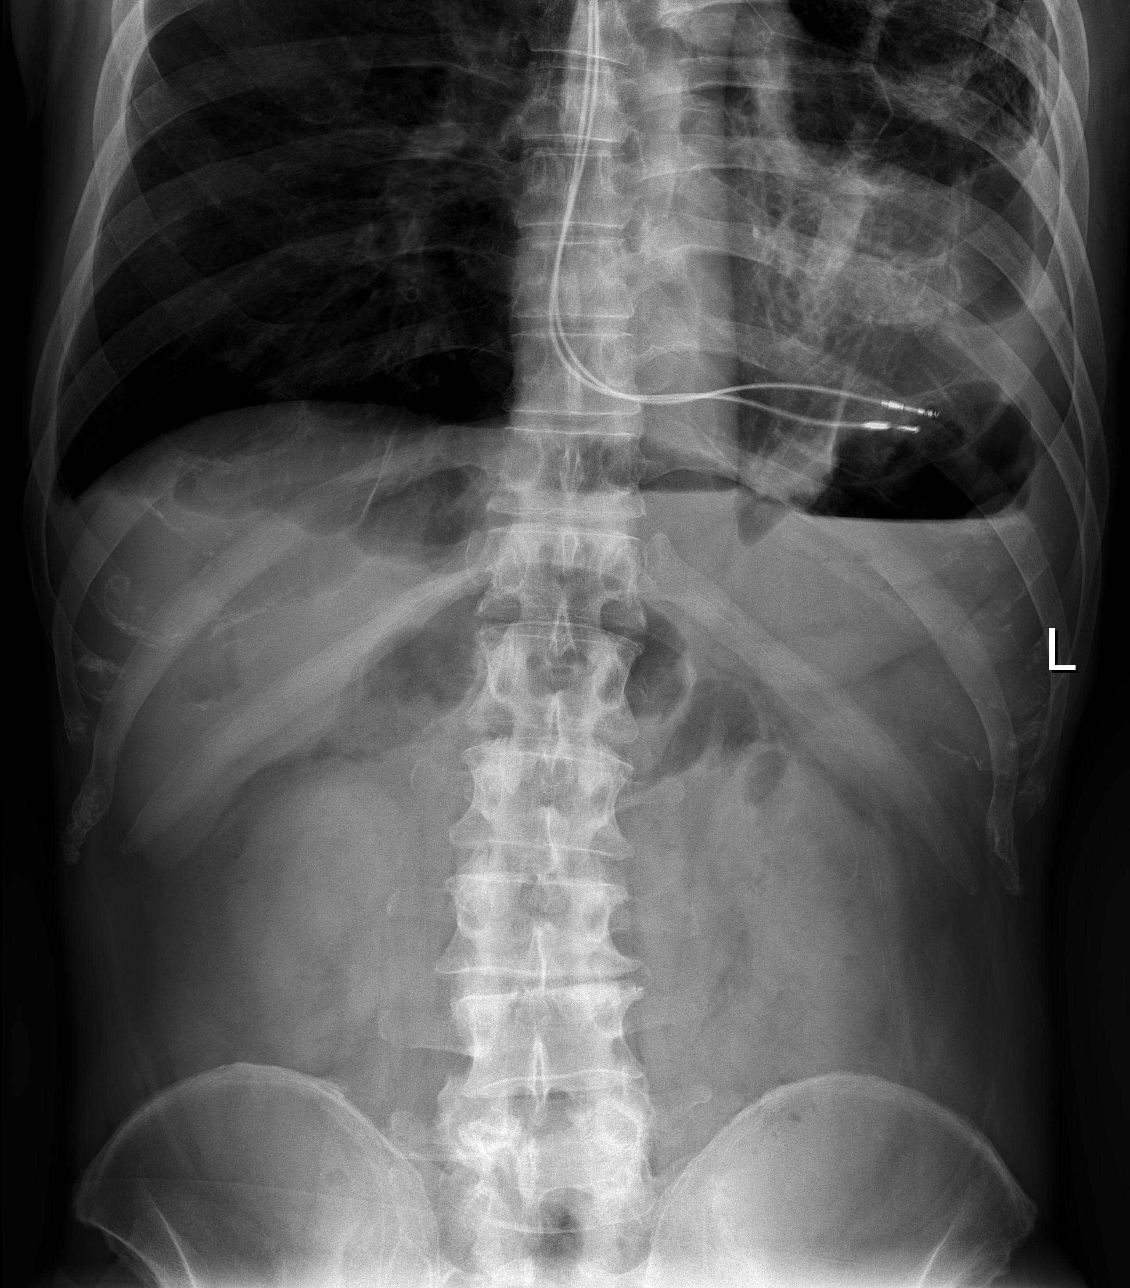

[t abdomen supine]
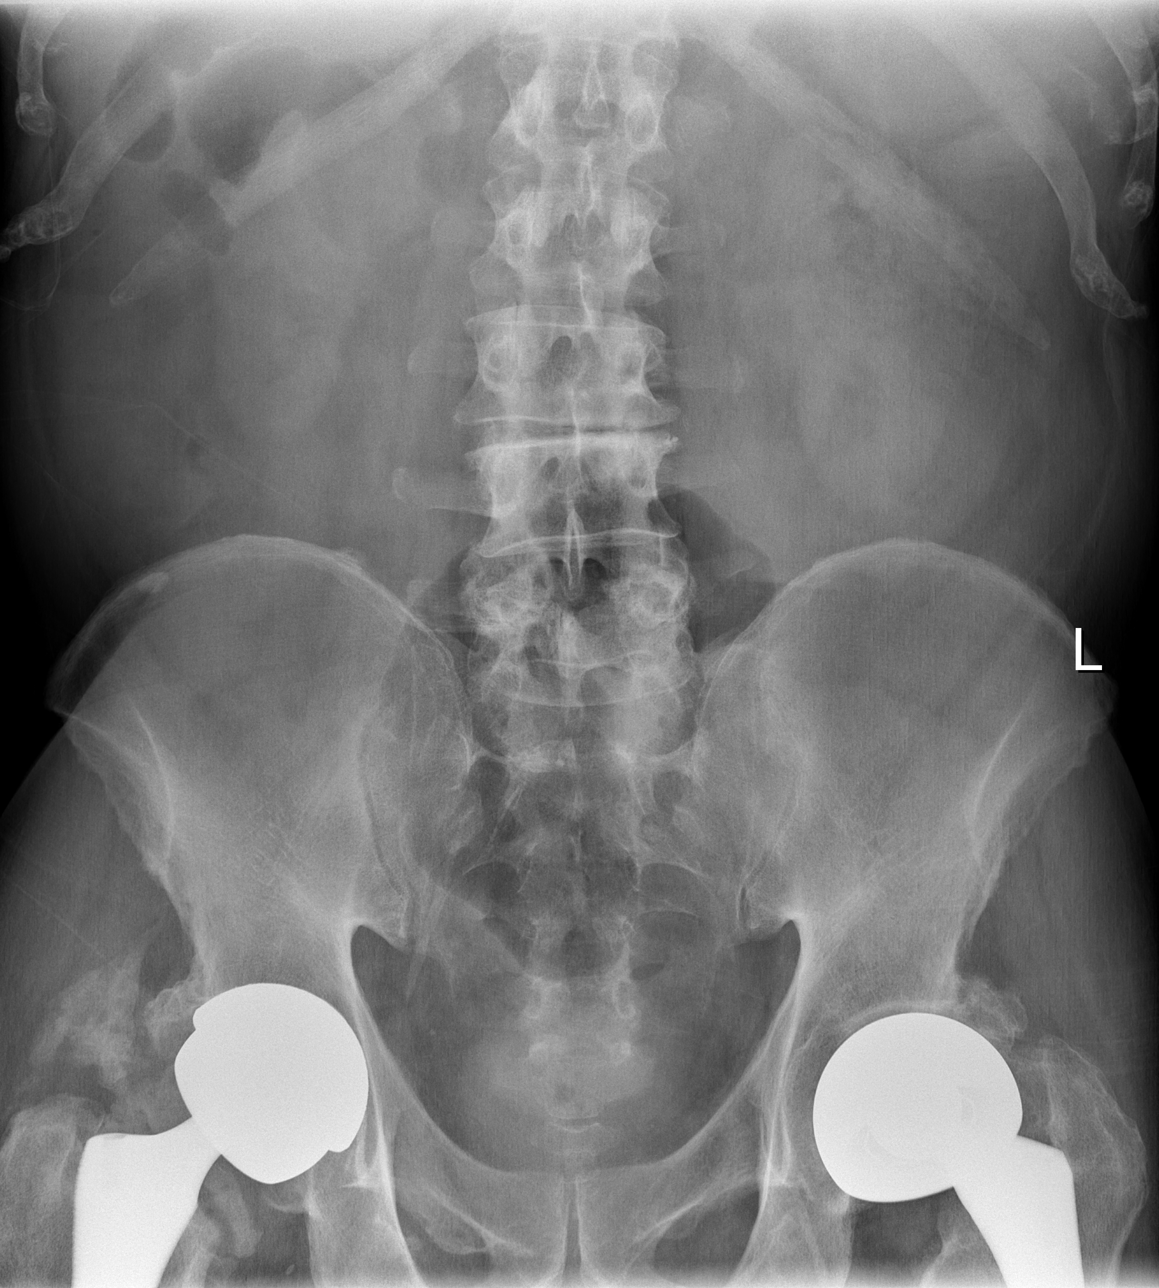

[3 of 3 positions shown; findings below may reference images not displayed]

FINDINGS: The lungs appear better aerated.  Pleural and parenchymal
scarring on the left is stable.  The right lung is clear.  Heart
size is stable.  A permanent pacemaker remains.

Supine and erect views of the abdomen show no bowel obstruction and
no free air.  There are degenerative changes in the lower lumbar
spine.  Bilateral hip replacements are noted.
IMPRESSION: 1.  Improved aeration.  Stable pleural and parenchymal scarring on
the left.
2.  No bowel obstruction or free air.

## 2009-12-19 ENCOUNTER — Telehealth: Payer: Self-pay | Admitting: Internal Medicine

## 2009-12-21 ENCOUNTER — Telehealth: Payer: Self-pay | Admitting: *Deleted

## 2009-12-21 ENCOUNTER — Encounter: Payer: Self-pay | Admitting: Internal Medicine

## 2010-01-03 ENCOUNTER — Ambulatory Visit: Payer: Self-pay | Admitting: Internal Medicine

## 2010-01-04 ENCOUNTER — Telehealth: Payer: Self-pay

## 2010-01-04 ENCOUNTER — Encounter: Payer: Self-pay | Admitting: Internal Medicine

## 2010-01-04 LAB — CONVERTED CEMR LAB
AST: 16 units/L (ref 0–37)
Albumin: 4.6 g/dL (ref 3.5–5.2)
BUN: 28 mg/dL — ABNORMAL HIGH (ref 6–23)
Basophils Absolute: 0 10*3/uL (ref 0.0–0.1)
CO2: 19 meq/L (ref 19–32)
Calcium: 9.7 mg/dL (ref 8.4–10.5)
Chloride: 111 meq/L (ref 96–112)
Eosinophils Relative: 4 % (ref 0–5)
Free T4: 1.71 ng/dL (ref 0.80–1.80)
Glucose, Bld: 80 mg/dL (ref 70–99)
HCT: 49.9 % (ref 39.0–52.0)
Hemoglobin: 16.4 g/dL (ref 13.0–17.0)
LDH: 213 units/L (ref 94–250)
Lymphocytes Relative: 42 % (ref 12–46)
Lymphs Abs: 3.3 10*3/uL (ref 0.7–4.0)
Monocytes Absolute: 0.7 10*3/uL (ref 0.1–1.0)
Monocytes Relative: 9 % (ref 3–12)
Neutro Abs: 3.5 10*3/uL (ref 1.7–7.7)
Potassium: 5.4 meq/L — ABNORMAL HIGH (ref 3.5–5.3)
RBC: 5.21 M/uL (ref 4.22–5.81)
WBC: 7.9 10*3/uL (ref 4.0–10.5)

## 2010-01-18 ENCOUNTER — Telehealth: Payer: Self-pay | Admitting: Internal Medicine

## 2010-01-19 ENCOUNTER — Ambulatory Visit: Payer: Self-pay | Admitting: Internal Medicine

## 2010-01-19 ENCOUNTER — Telehealth: Payer: Self-pay | Admitting: Internal Medicine

## 2010-01-19 DIAGNOSIS — F339 Major depressive disorder, recurrent, unspecified: Secondary | ICD-10-CM

## 2010-01-19 DIAGNOSIS — R51 Headache: Secondary | ICD-10-CM

## 2010-01-19 DIAGNOSIS — R519 Headache, unspecified: Secondary | ICD-10-CM | POA: Insufficient documentation

## 2010-01-19 LAB — CONVERTED CEMR LAB: TSH: 1.474 microintl units/mL (ref 0.350–4.5)

## 2010-02-02 ENCOUNTER — Encounter: Payer: Self-pay | Admitting: Internal Medicine

## 2010-02-05 ENCOUNTER — Ambulatory Visit: Payer: Self-pay | Admitting: Cardiology

## 2010-02-05 ENCOUNTER — Encounter: Payer: Self-pay | Admitting: Internal Medicine

## 2010-02-05 ENCOUNTER — Ambulatory Visit: Payer: Self-pay | Admitting: Internal Medicine

## 2010-02-05 ENCOUNTER — Inpatient Hospital Stay (HOSPITAL_COMMUNITY): Admission: EM | Admit: 2010-02-05 | Discharge: 2010-02-06 | Payer: Self-pay | Admitting: Emergency Medicine

## 2010-02-06 ENCOUNTER — Encounter: Payer: Self-pay | Admitting: Internal Medicine

## 2010-02-06 LAB — CONVERTED CEMR LAB
BUN: 22 mg/dL
Cholesterol: 98 mg/dL
Potassium: 4.1 meq/L
Sodium: 138 meq/L
Triglyceride fasting, serum: 49 mg/dL

## 2010-02-09 IMAGING — CR DG CHEST 1V PORT
1 series · 1 of 1 positions shown · non-contrast
Comparison: 08/22/2008

CLINICAL DATA: Wheezing

PORTABLE CHEST - 1 VIEW

[AP]
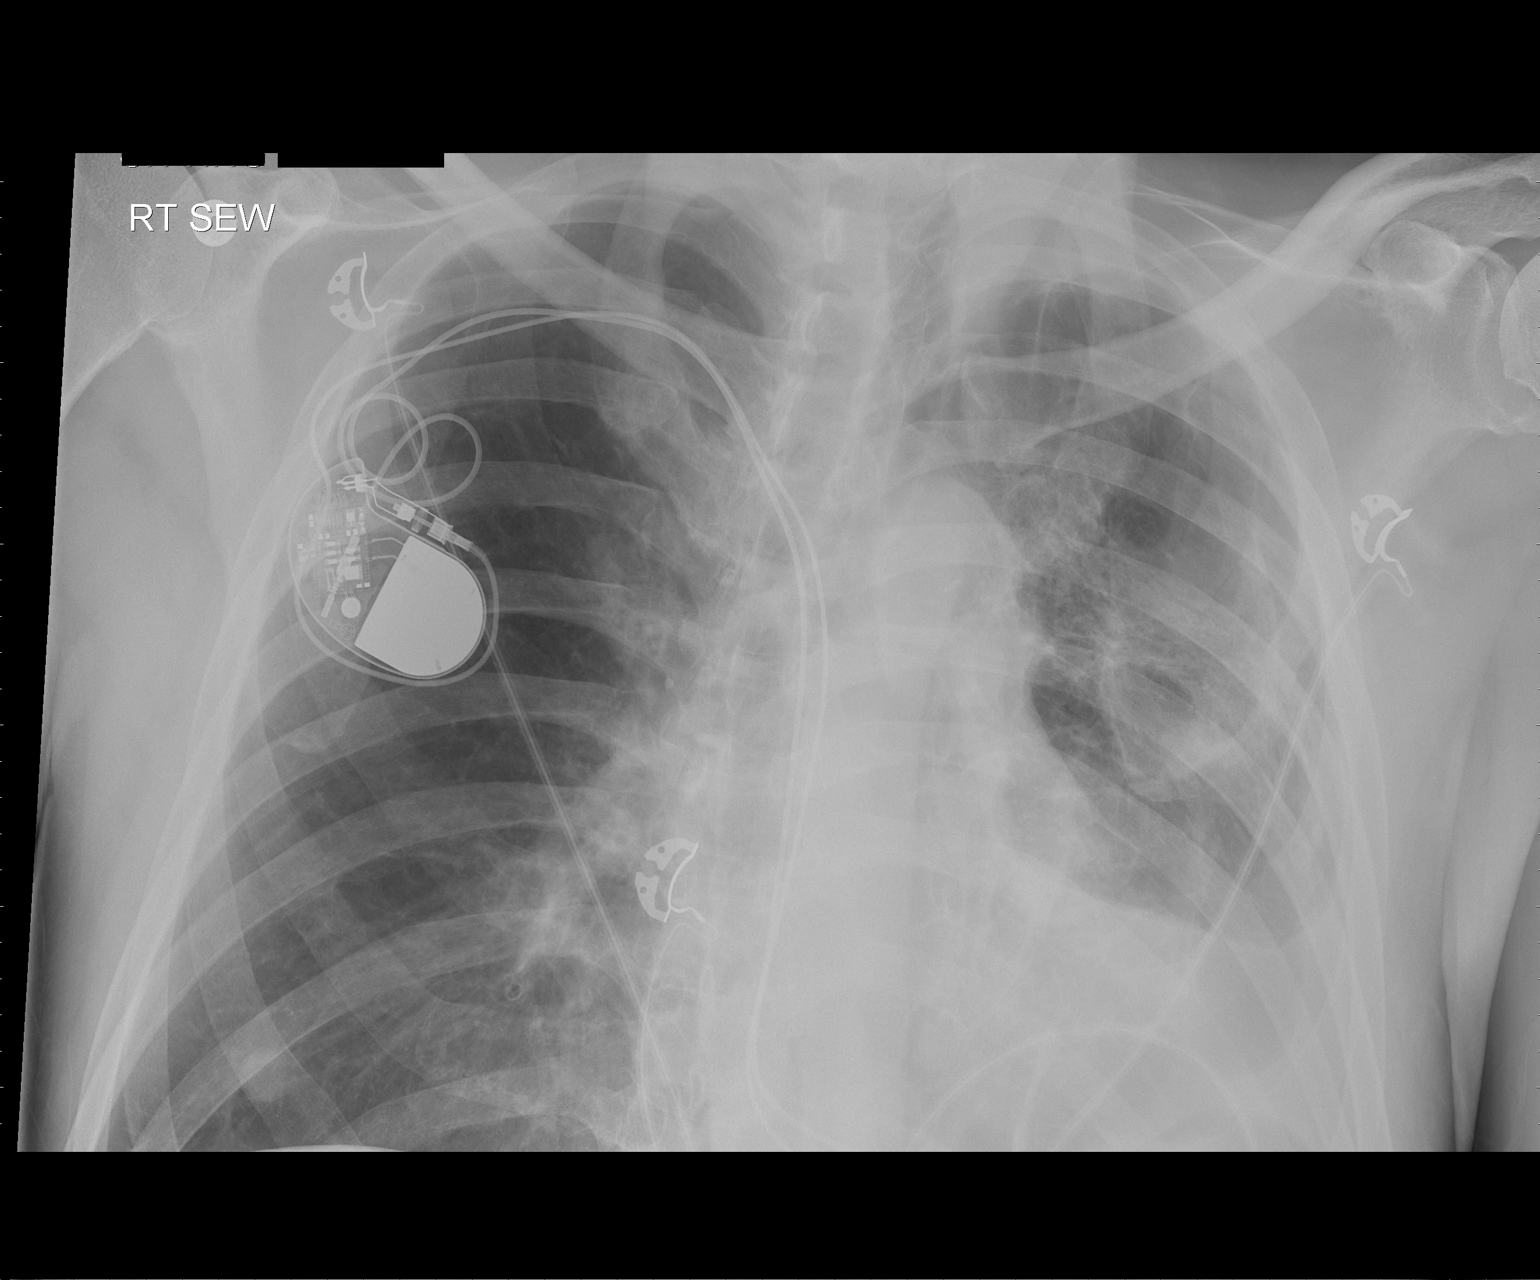

[1 of 1 positions shown; findings below may reference images not displayed]

FINDINGS: Persistent left pleural effusion.  Associated atelectasis
or infiltrate in the left mid and lower lung largely stable.  There
is been improvement in the right lung interstitial edema or
infiltrate.  Heart size difficult to assess due to adjacent
opacities.  Right subclavian pacemaker stable.  14 mm nodule
projects in the right lung base.
IMPRESSION: 1.  Improvement in right lung interstitial edema or infiltrates.
2.  Persistent left effusion and left mid and lower lung
atelectasis or infiltrate.
3.  14 mm nodule the right lung base.  This is more conspicuous
than on prior studies.  Consider CT to exclude neoplasm.

## 2010-02-14 IMAGING — CR DG CHEST 2V
1 series · 1 of 1 positions shown · non-contrast
Comparison: 11/17/2008

CLINICAL DATA: Pneumonia.  Atrial fibrillation.

CHEST - 2 VIEW

[view not recorded]
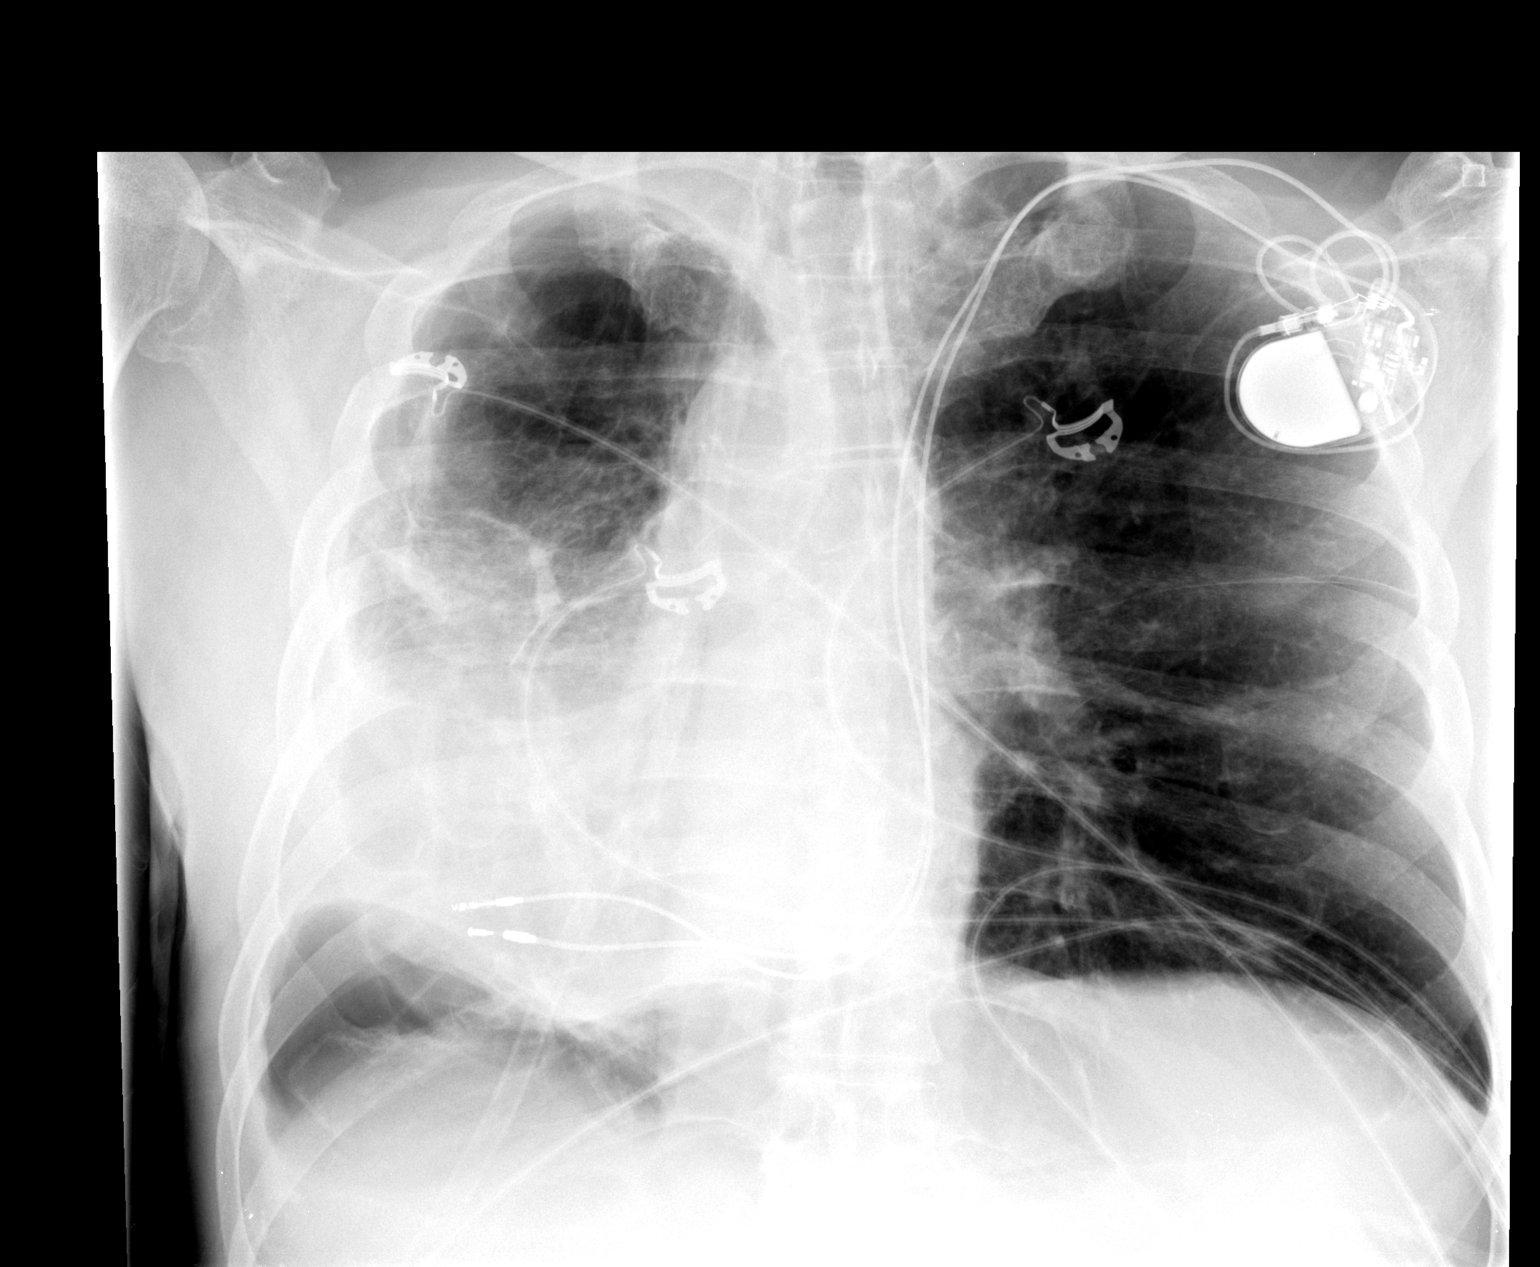

[1 of 1 positions shown; findings below may reference images not displayed]

FINDINGS: Left pleural effusion is again noted.  Left lower lung
zone infiltrate.  Pacer leads unchanged.
IMPRESSION: Persistent left pleural effusion and left mid to lower lung zone
pneumonia.

## 2010-02-15 ENCOUNTER — Ambulatory Visit (HOSPITAL_COMMUNITY): Admission: RE | Admit: 2010-02-15 | Discharge: 2010-02-15 | Payer: Self-pay | Admitting: Internal Medicine

## 2010-02-15 ENCOUNTER — Encounter: Payer: Self-pay | Admitting: Internal Medicine

## 2010-02-15 ENCOUNTER — Ambulatory Visit: Payer: Self-pay | Admitting: Internal Medicine

## 2010-02-15 ENCOUNTER — Telehealth: Payer: Self-pay | Admitting: Internal Medicine

## 2010-03-06 IMAGING — CR DG CHEST 1V PORT
1 series · 1 of 1 positions shown · non-contrast
Comparison: 11/22/2008 and earlier.

CLINICAL DATA: 62-year-old male with shortness of breath and
weakness.

PORTABLE CHEST - 1 VIEW

[view not recorded]
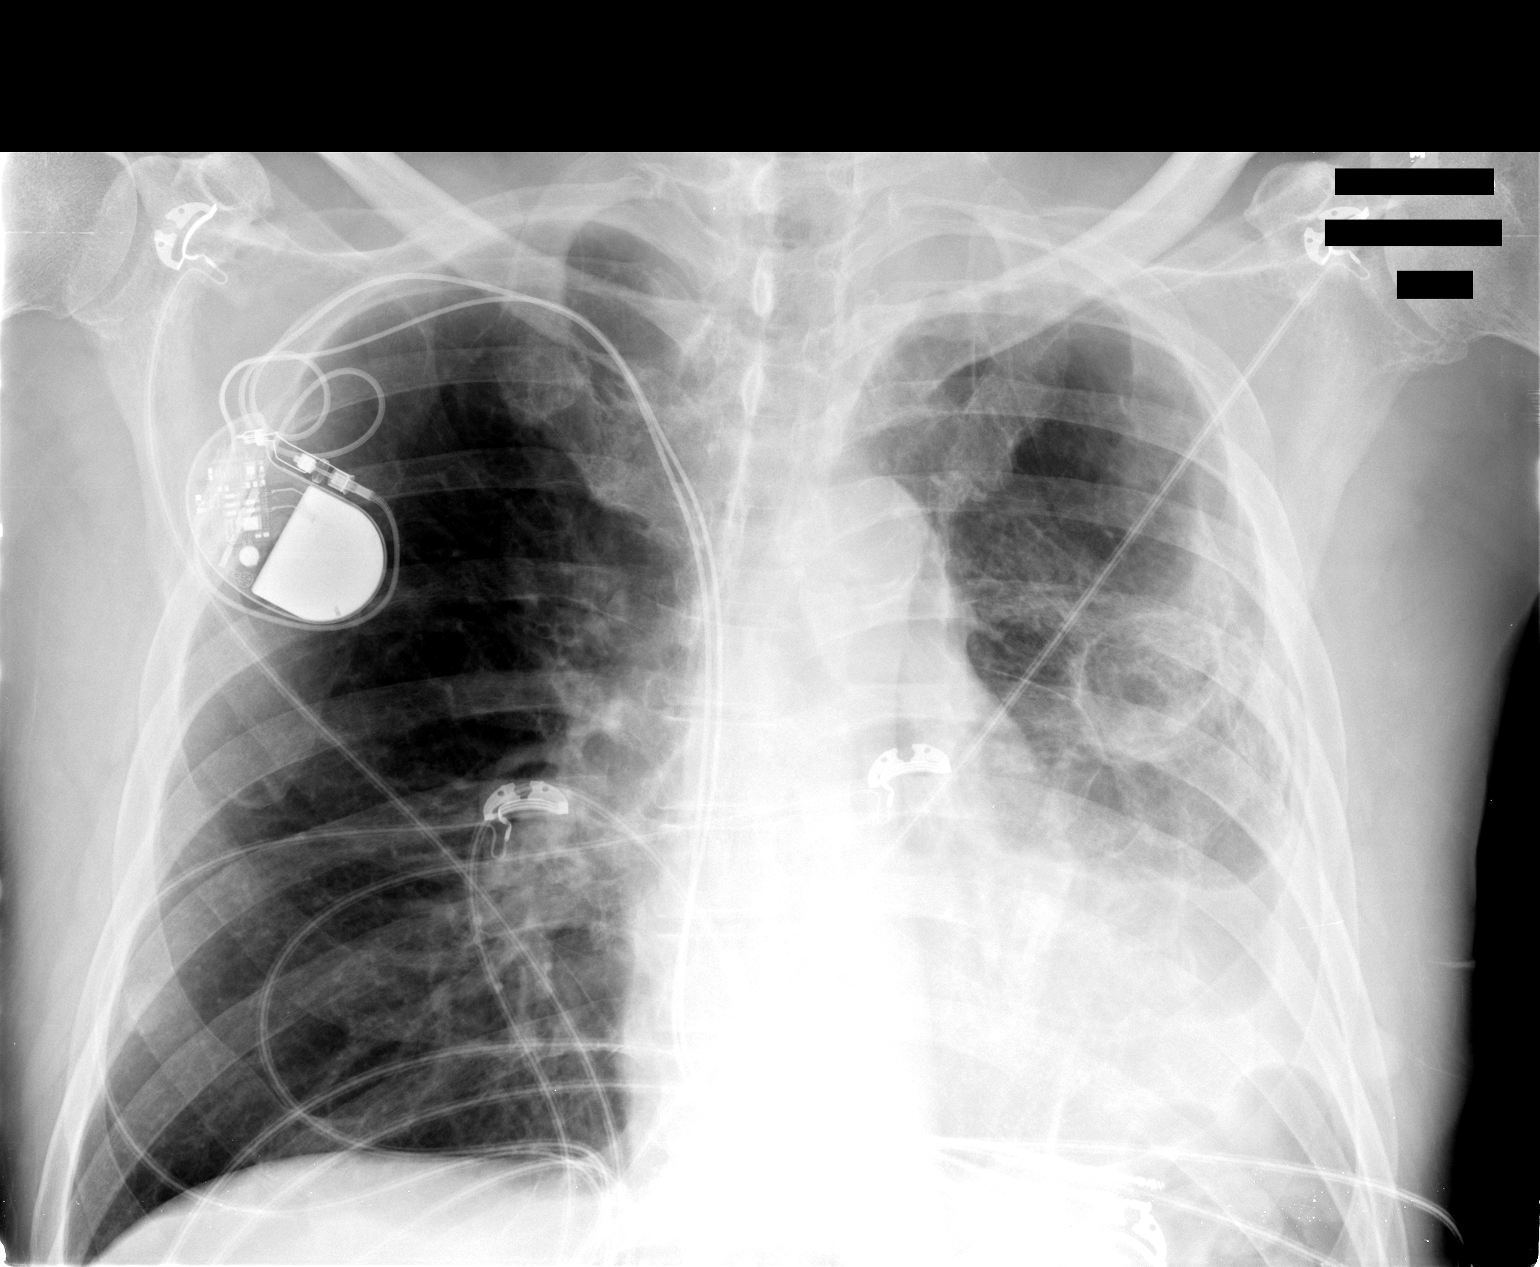

[1 of 1 positions shown; findings below may reference images not displayed]

FINDINGS: Portable upright AP view at 9590 hours.  Chronic
fibrothorax evident at the left base on the abdomen CT dated
09/23/2008, and chest CT from 10/17/2006. Chronic thick-walled
lesion in the left midlung simulating a cavitating process, also
seen on that CT.  Stable right chest cardiac pacemaker. Stable
cardiomegaly and mediastinal contours.  No acute airspace disease.
IMPRESSION: No acute findings in the chest.  Chronic fibrothorax and
parenchymal scarring on the left.  Cardiomegaly.

## 2010-03-08 IMAGING — CT CT ANGIO CHEST
2 of 6 series · 18 of 36 positions shown · IV contrast (APPLIED)
Comparison: Chest x-ray of 12/12/2008

CLINICAL DATA: Mid chest pain, short of breath

CT ANGIOGRAPHY CHEST WITH CONTRAST
TECHNIQUE: Multidetector CT imaging of the chest was performed
using the standard protocol during bolus administration of
intravenous contrast. Multiplanar CT image reconstructions
including MIPs were obtained to evaluate the vascular anatomy.
Contrast: 100 ml [id number]

[Series 8: pulm embolism 1.0 b25f thins · axial · 0.74mm/px · z∈[-302,-50]mm · 17 of 282 slices shown]
[im 15/282  lung]
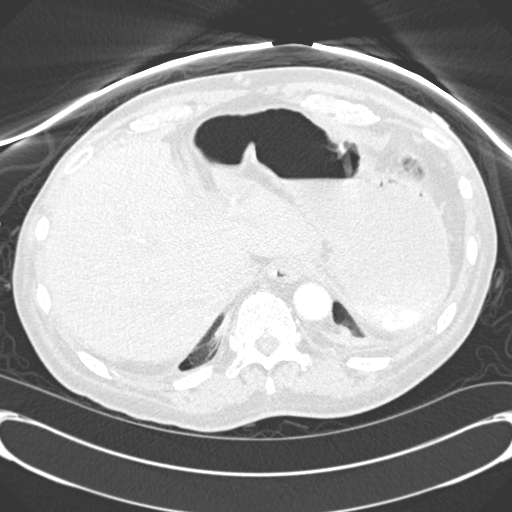
[im 29/282  mediastinal]
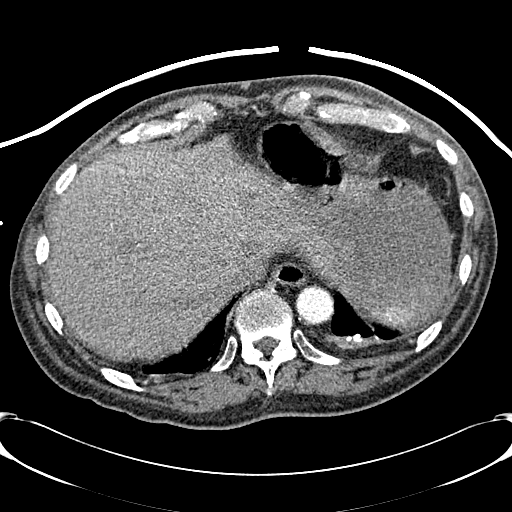
[im 43/282  lung]
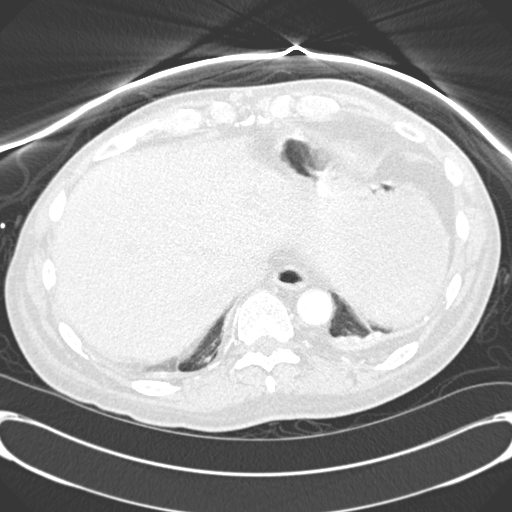
[im 57/282  mediastinal]
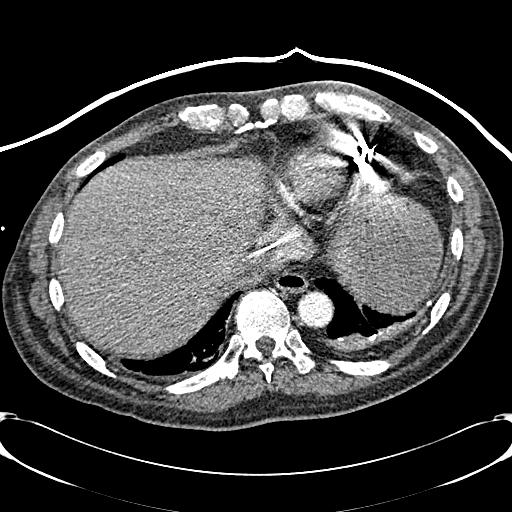
[im 85/282  lung]
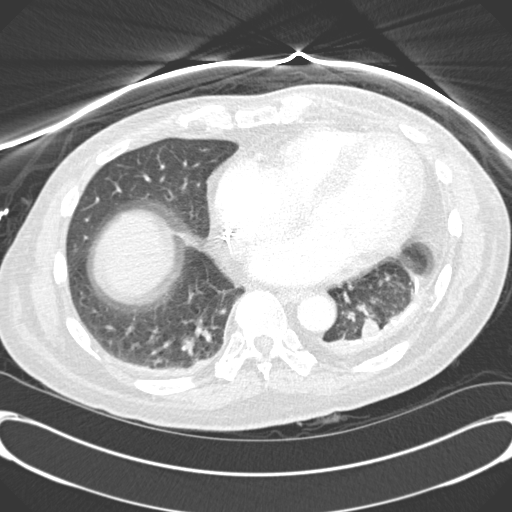
[im 99/282  mediastinal]
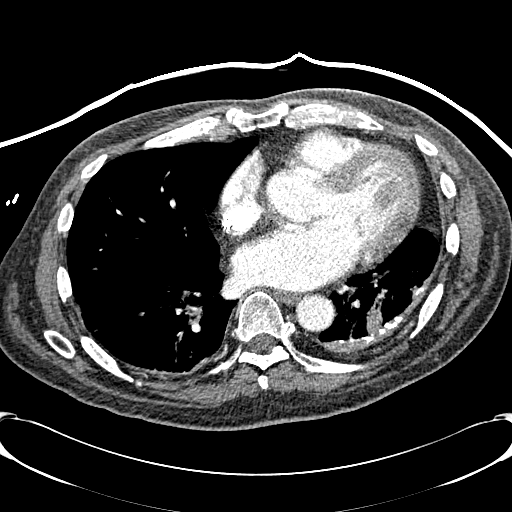
[im 113/282  lung]
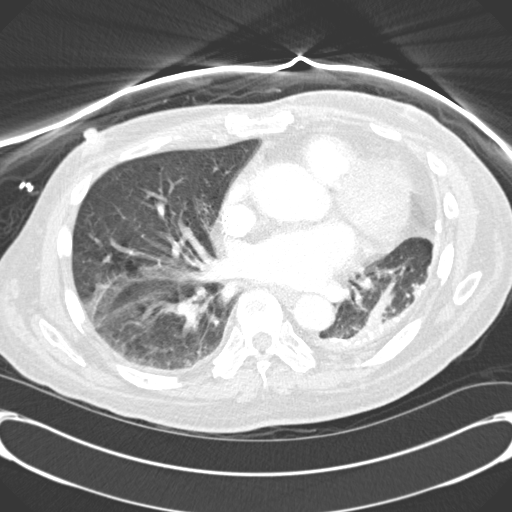
[im 127/282  mediastinal]
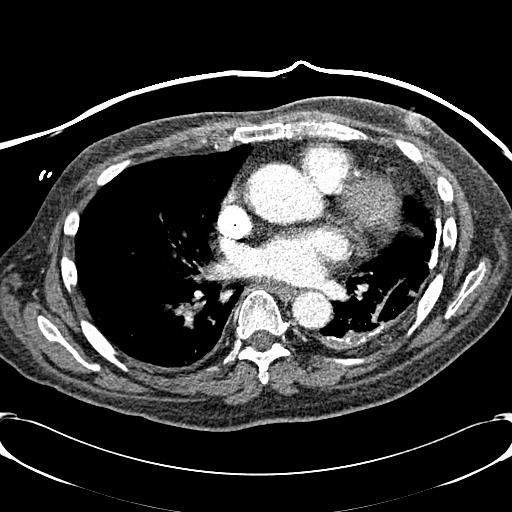
[im 141/282  lung]
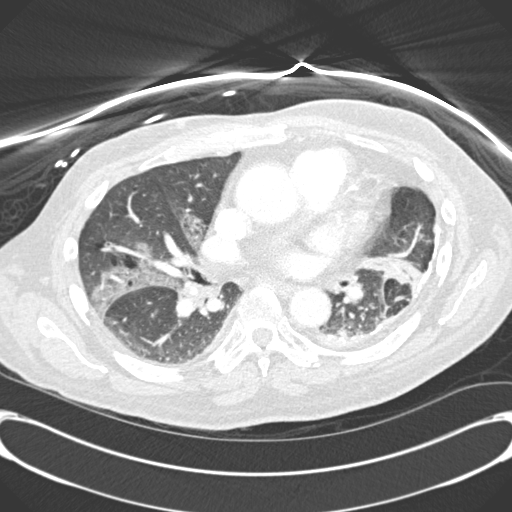
[im 155/282  mediastinal]
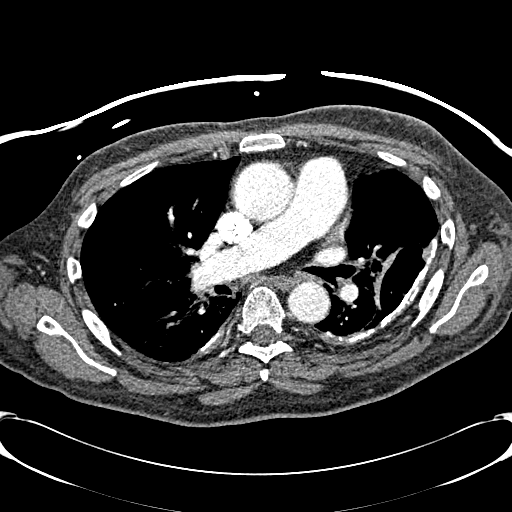
[im 169/282  lung]
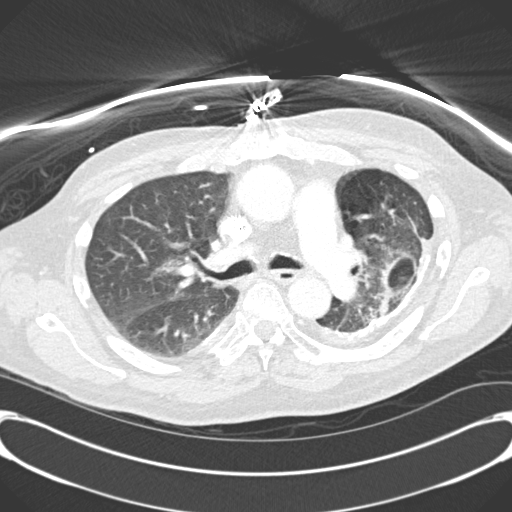
[im 183/282  mediastinal]
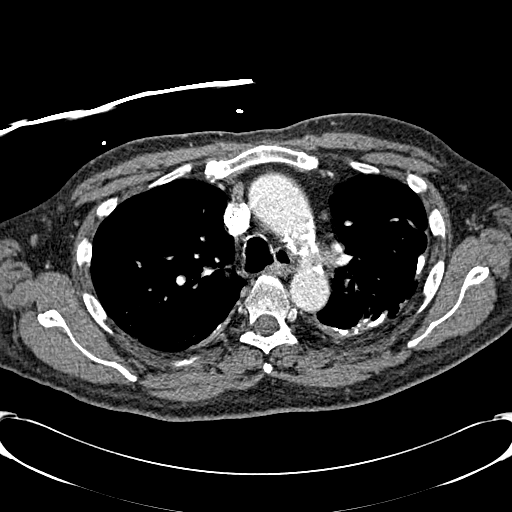
[im 197/282  lung]
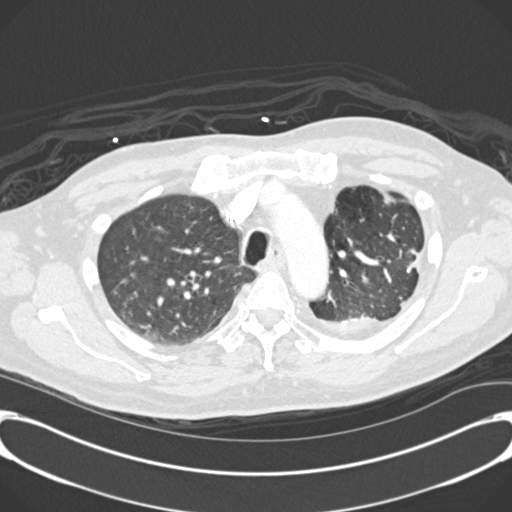
[im 225/282  mediastinal]
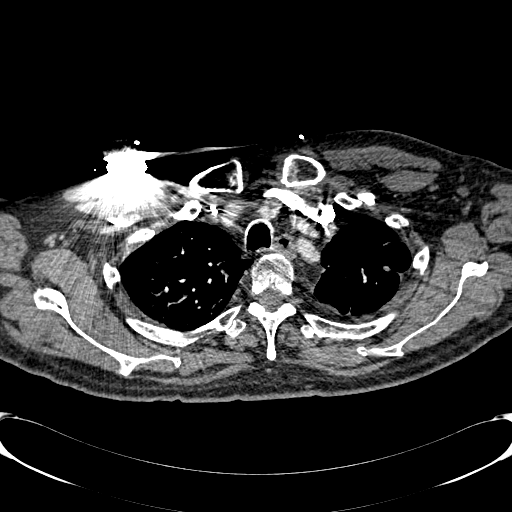
[im 239/282  lung]
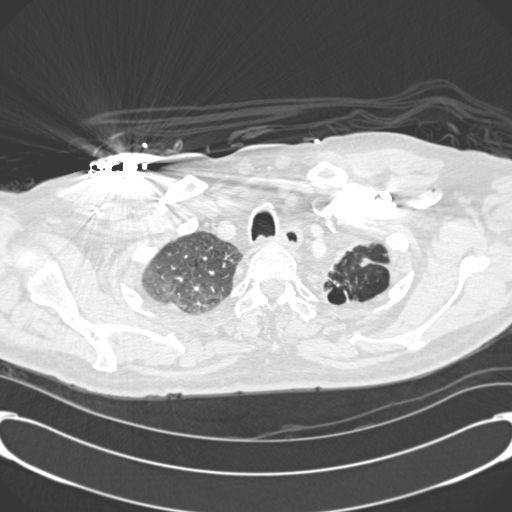
[im 253/282  mediastinal]
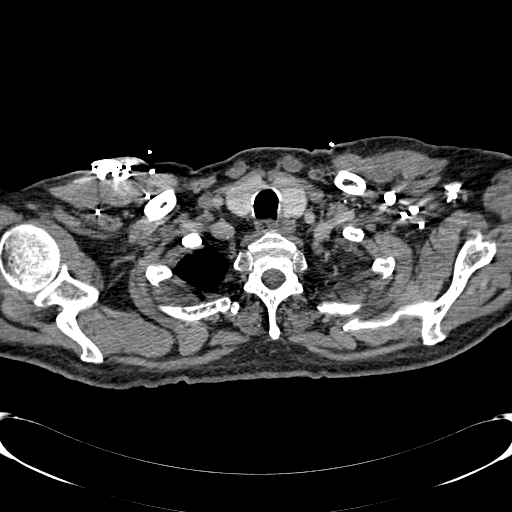
[im 267/282  lung]
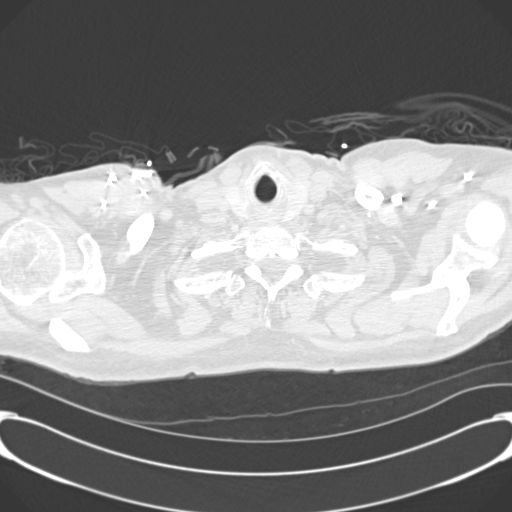

[Series 602: coronal pe · coronal · 0.74mm/px · 1 of 98 slices shown]
[im 49/98  mediastinal]
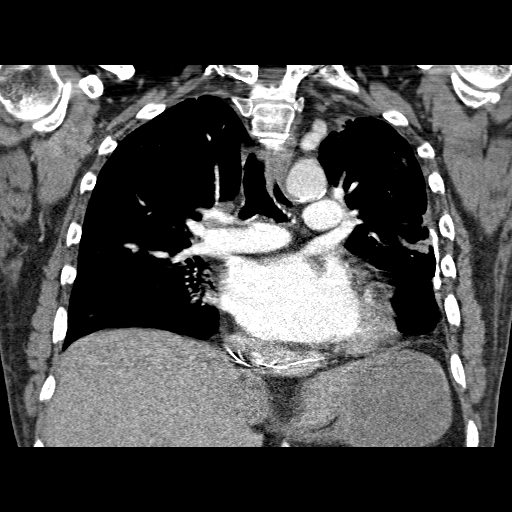

[18 of 36 positions shown; findings below may reference images not displayed]

FINDINGS: The pulmonary arteries opacify with no evidence of
pulmonary embolism.  The thoracic aorta opacifies with no acute
abnormality.  The ascending thoracic aorta is slightly fusiformly
prominent measuring up to 43 mm in diameter.  Some coronary artery
calcification is noted and there is cardiomegaly present.  Pleural
thickening is noted left greater than right with some pleural
calcification. Linear opacities in both lower lobes left greater
than right are most consistent with scarring, with some volume loss
at the left lung base.  There is however airspace disease within
the posterior inferior right upper lobe, and pneumonia is a
consideration.  No definite effusion is seen and there is no
evidence of mediastinal or hilar adenopathy.  A pleural based soft
tissue mass posteriorly at the left lung base probably represents
parenchymal scarring, but follow-up is recommended to exclude
neoplasm.  The thyroid gland is slightly prominent diffusely.

Review of the MIP images confirms the above findings.
IMPRESSION: 1.  No evidence of pulmonary embolism.
2.  Airspace disease in the posterior right upper lobe may
represent pneumonia.
3.  No acute abnormality of the thoracic aorta.
4.  Bilateral pleural calcifications and probable pleuroparenchymal
scarring left greater than right.  However, with a nodular opacity
at the left lung base which most likely represent scarring, follow-
up CT chest scan in 4-6 months is recommended to assess stability.
5.  Slightly prominent thyroid gland.
6.  Cardiomegaly.

## 2010-03-09 IMAGING — CR DG CHEST 1V PORT
1 series · 1 of 1 positions shown · non-contrast
Comparison: C t a chest 12/14/2008.  Chest x-ray 12/12/2008.

CLINICAL DATA: COPD.  PICC line placement.

PORTABLE CHEST - 1 VIEW

[view not recorded]
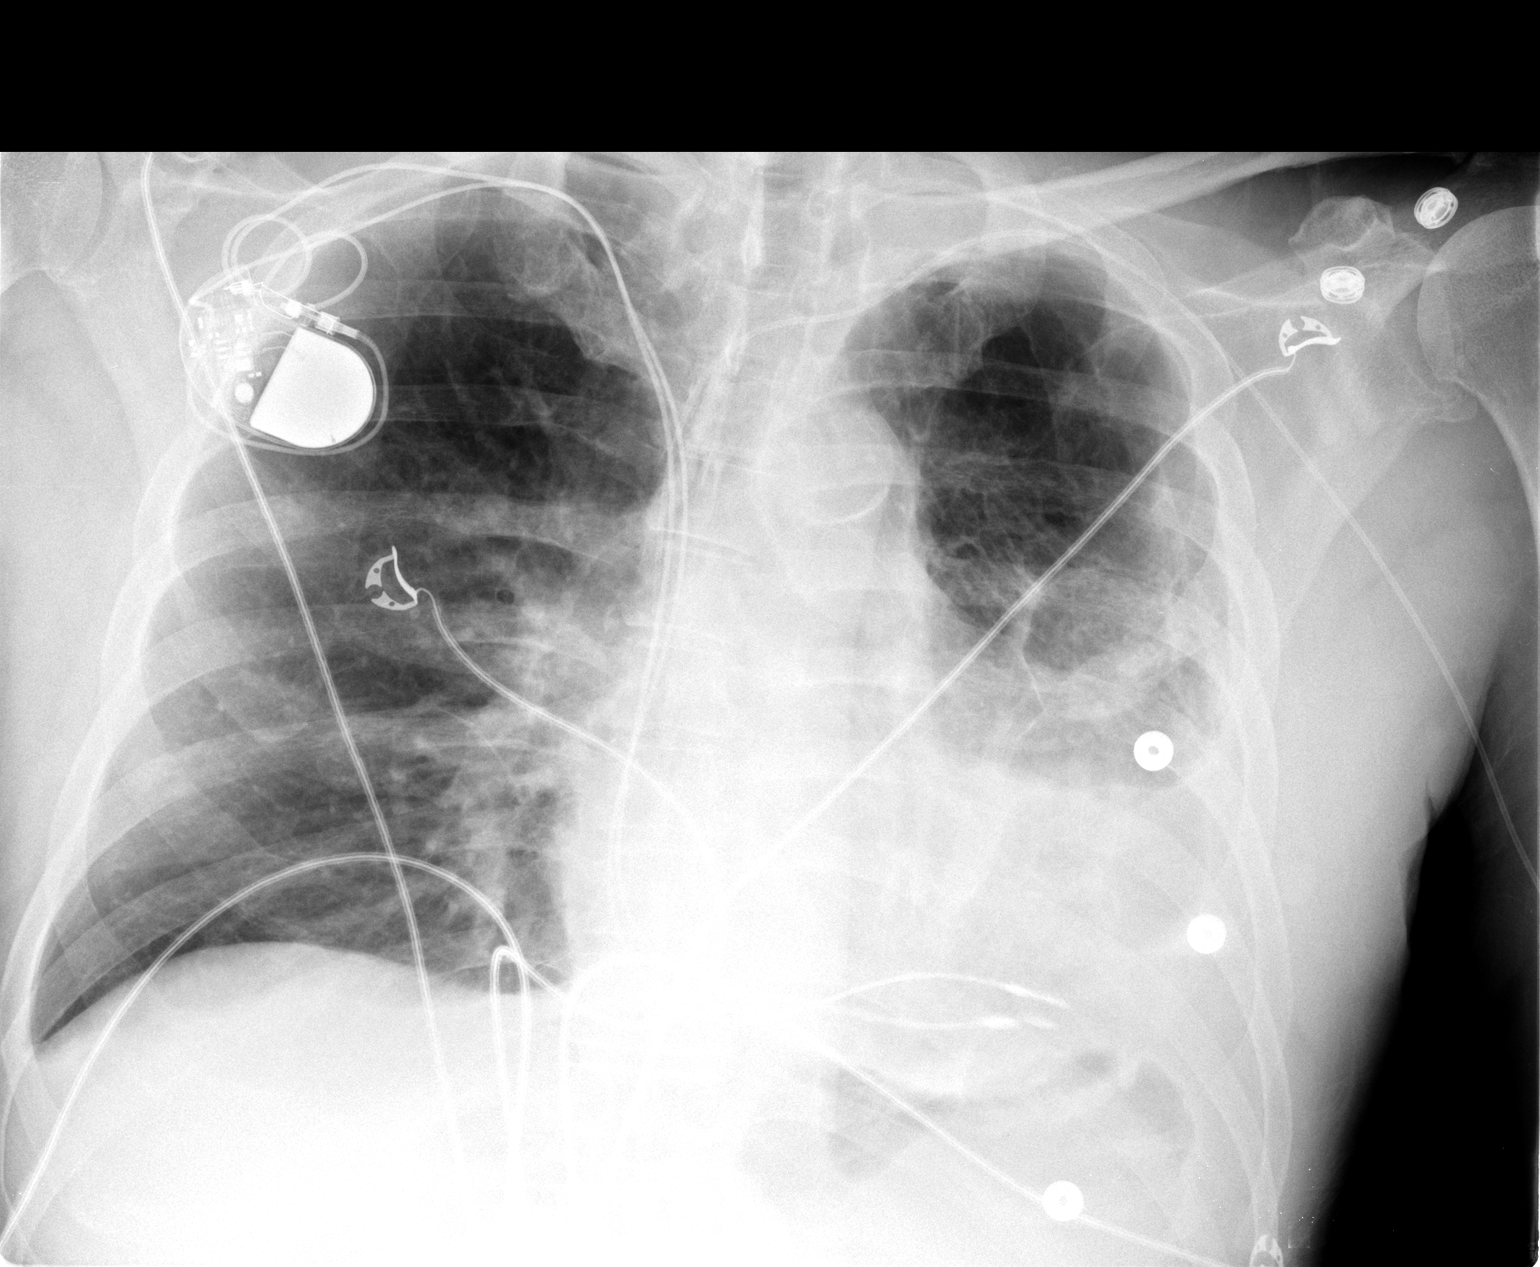

[1 of 1 positions shown; findings below may reference images not displayed]

FINDINGS: A left PICC line has been placed.  The tip is in the
distal SVC, above the cavoatrial junction.  Heart is enlarged.
Pleural calcifications within the left hemithorax are again noted.
Left pleural thickening is unchanged.  Left lower lobe airspace
disease is stable.  The right lung remains clear.
IMPRESSION: 1.  Interval placement of left-sided PICC line with the tip in the
distal SVC, above the cavoatrial junction.
2.  Stable pleural calcifications of the left hemithorax.
3.  Stable cardiomegaly.
4.  Persistent left lower lobe airspace disease.

## 2010-03-15 IMAGING — CR DG CHEST 2V
2 series · 2 of 2 positions shown · non-contrast
Comparison: CT chest 12/14/2008 and plain film chest 12/15/2008.

CLINICAL DATA: Chest pain, weakness and shortness of breath.

CHEST - 2 VIEW

[w chest lat]
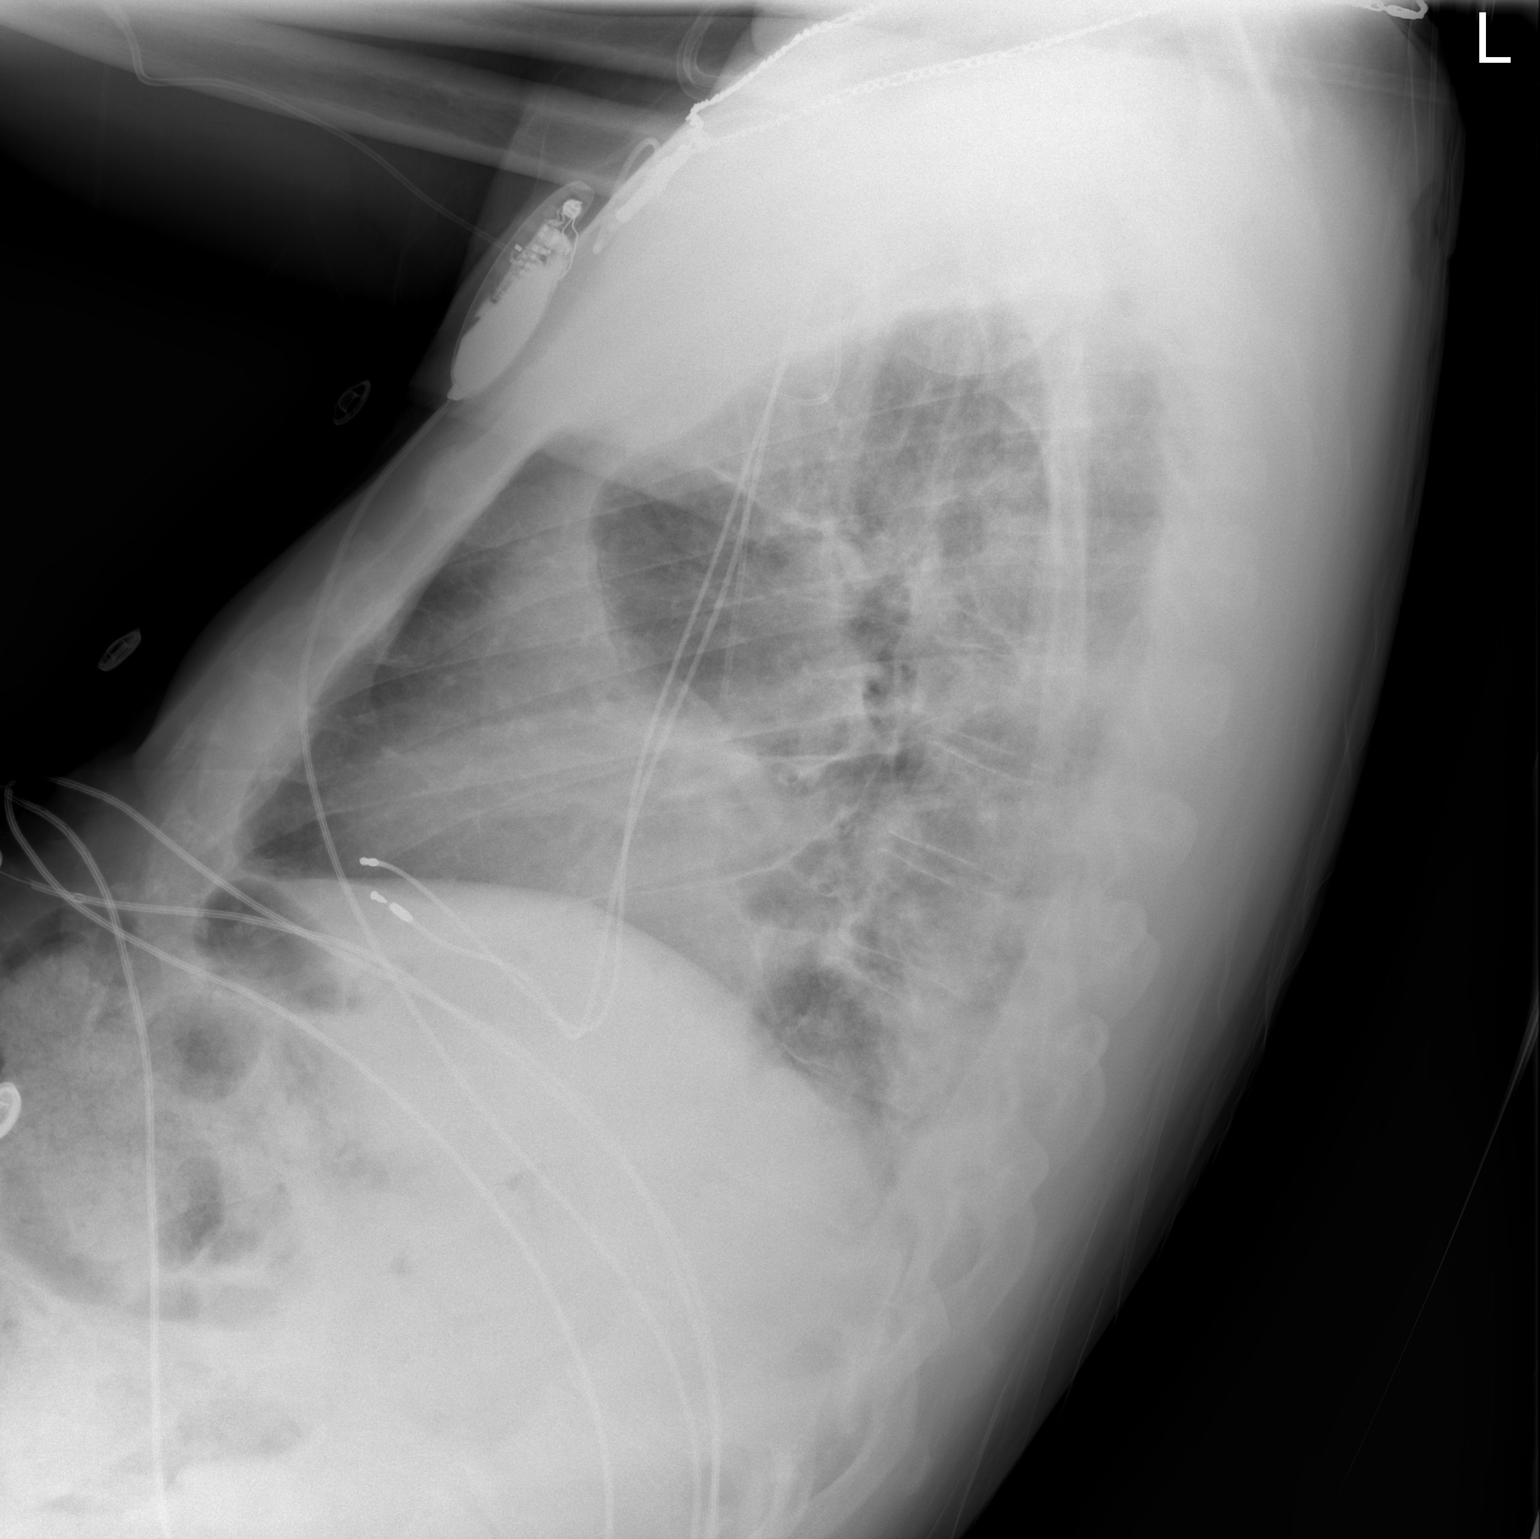

[view not recorded]
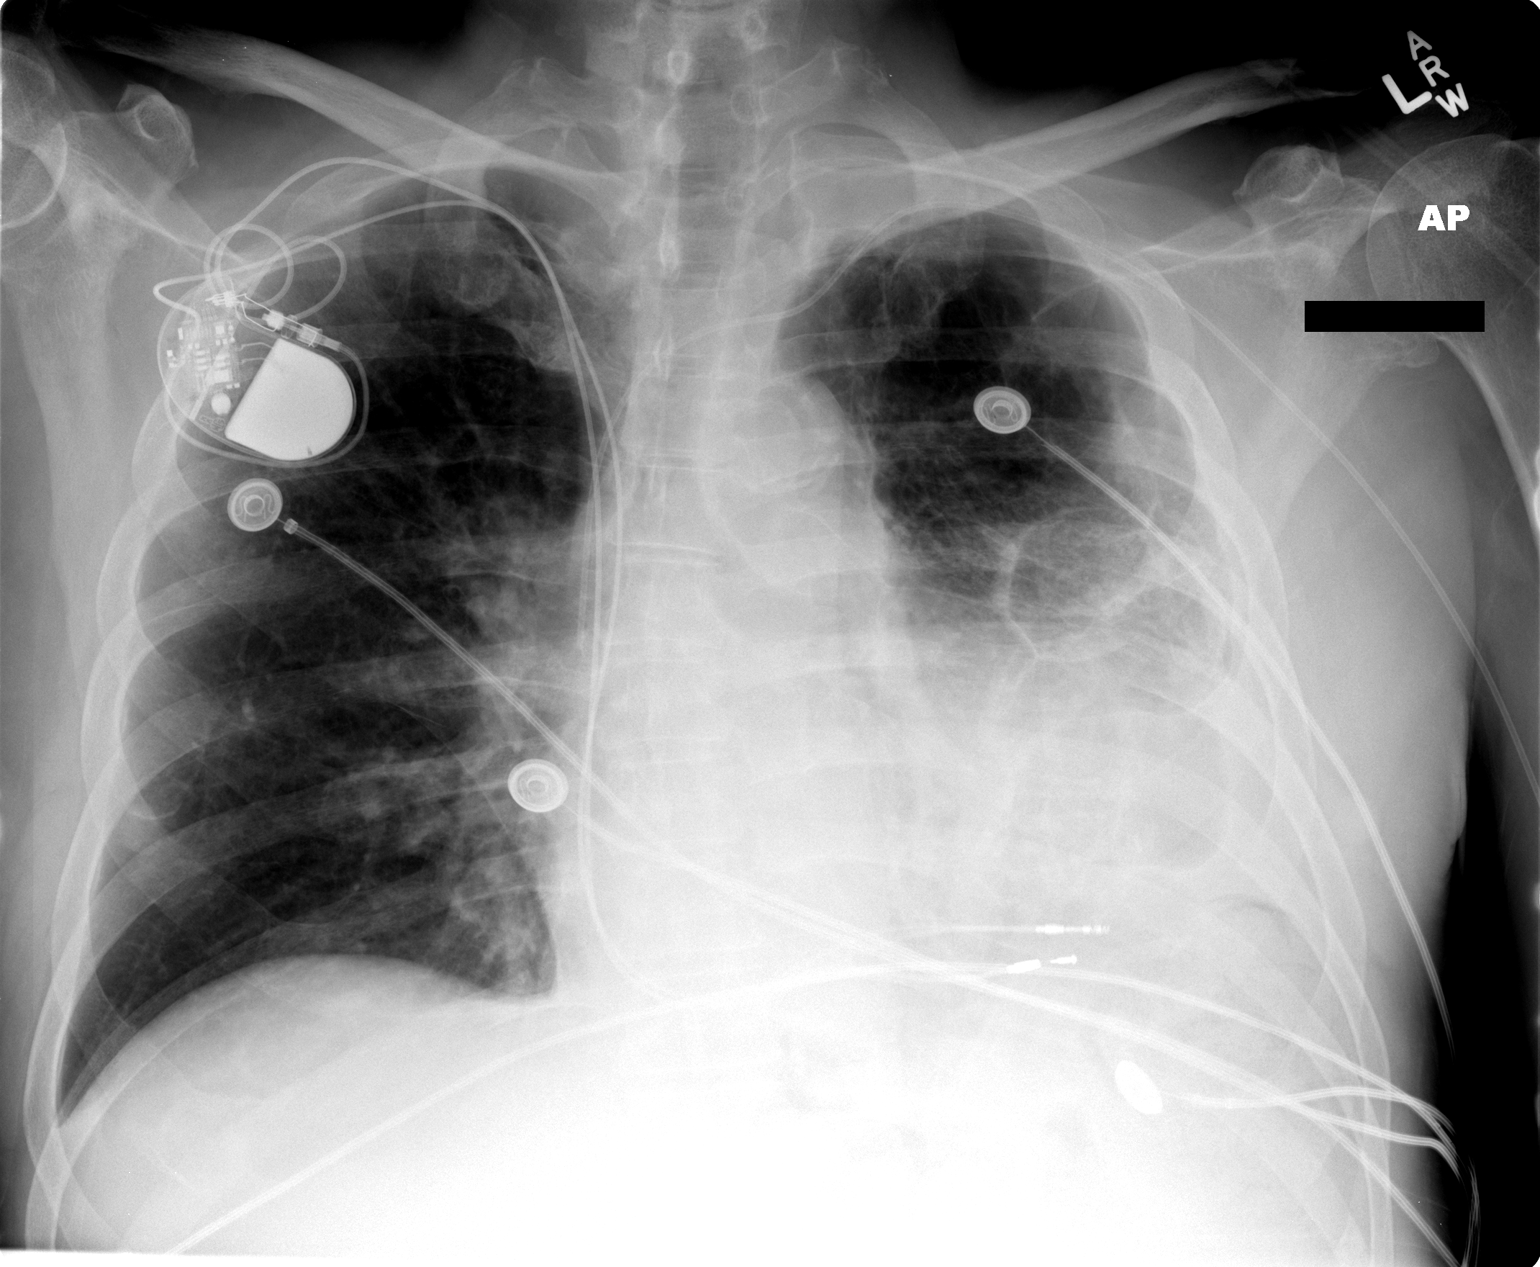

[2 of 2 positions shown; findings below may reference images not displayed]

FINDINGS: As seen on prior examinations, extensive pleural
calcifications are present in the lower left hemithorax.  The
appearance is unchanged.  Right lung is clear.  Heart size is
mildly enlarged.
IMPRESSION: No acute finding in patient with extensive pleural calcifications
on the left.

## 2010-03-16 IMAGING — CT CT PELVIS W/ CM
2 of 5 series · 10 of 32 positions shown, 15 images · IV contrast (100 ML OMNI 300)
Comparison: Abdominal radiograph performed earlier today at [DATE]
p.m.

CT ABDOMEN

CLINICAL DATA: No bowel movement for 13 days; suspicion for bowel
obstruction on radiograph.

CT ABDOMEN AND PELVIS WITH CONTRAST
TECHNIQUE: Multidetector CT imaging of the abdomen and pelvis was
performed using the standard protocol following bolus
administration of intravenous contrast.
Contrast: 100 mL of Omnipaque 300 IV contrast

[Series 2: routine abdomen · axial · 0.78mm/px · z∈[-525,-125]mm · 7 of 108 slices shown, 12 images]
[im 14/108  soft-tissue]
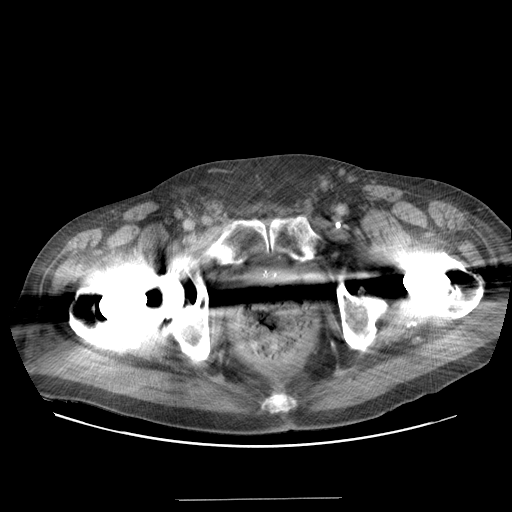
[im 14/108  bone]
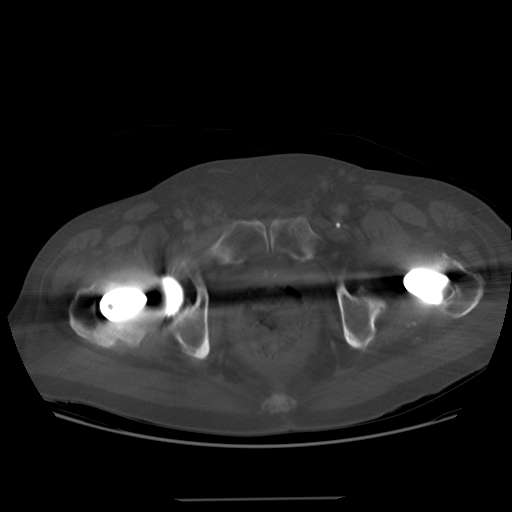
[im 27/108  soft-tissue]
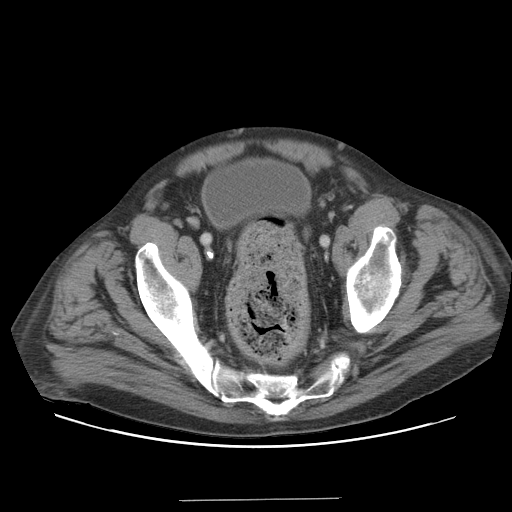
[im 41/108  soft-tissue]
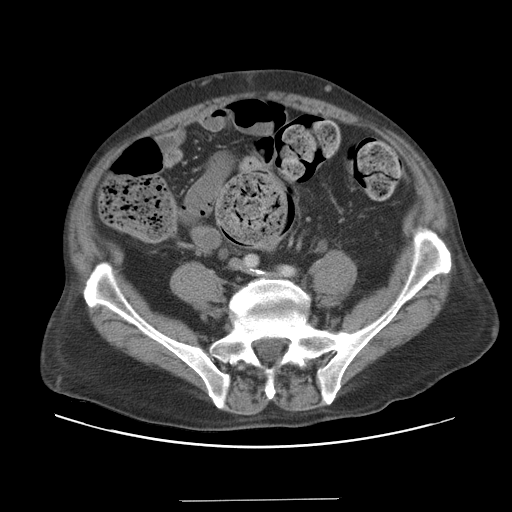
[im 54/108  soft-tissue]
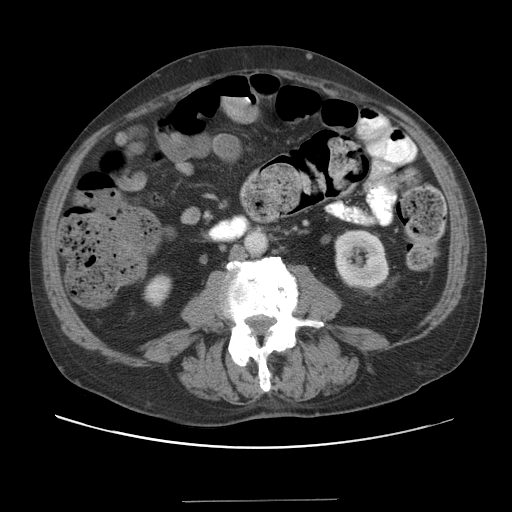
[im 54/108  lung]
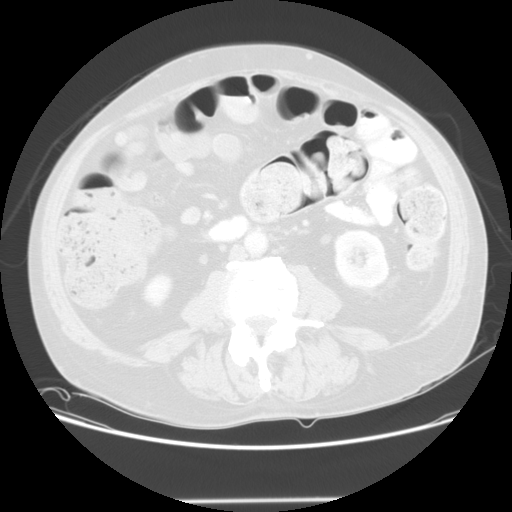
[im 67/108  soft-tissue]
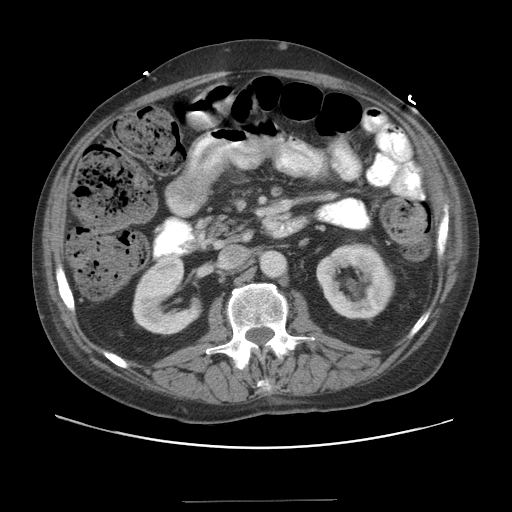
[im 67/108  lung]
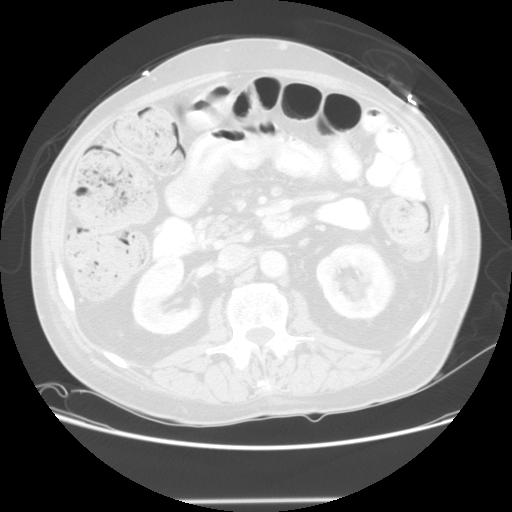
[im 81/108  soft-tissue]
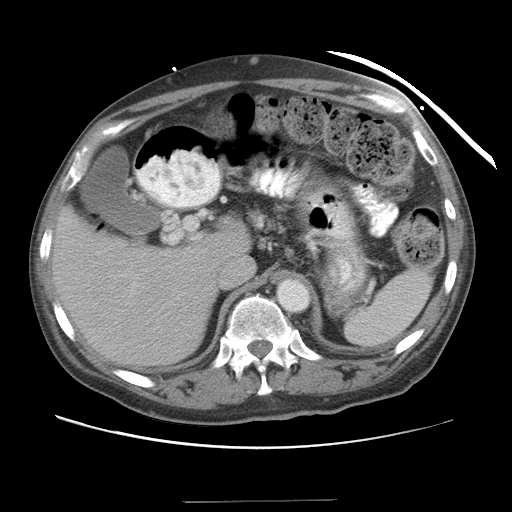
[im 81/108  lung]
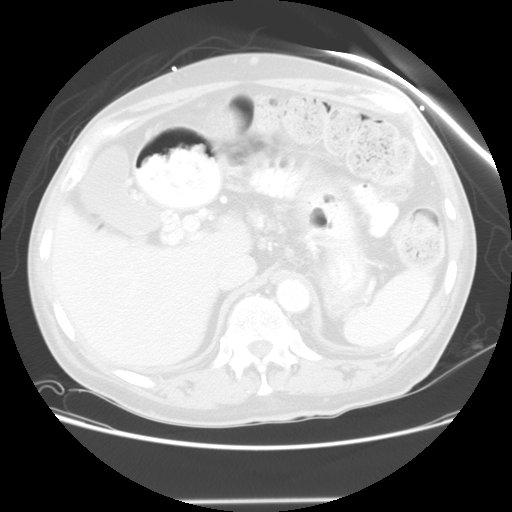
[im 94/108  soft-tissue]
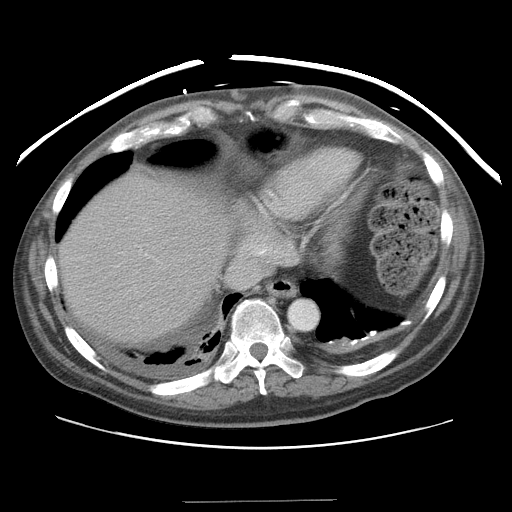
[im 94/108  lung]
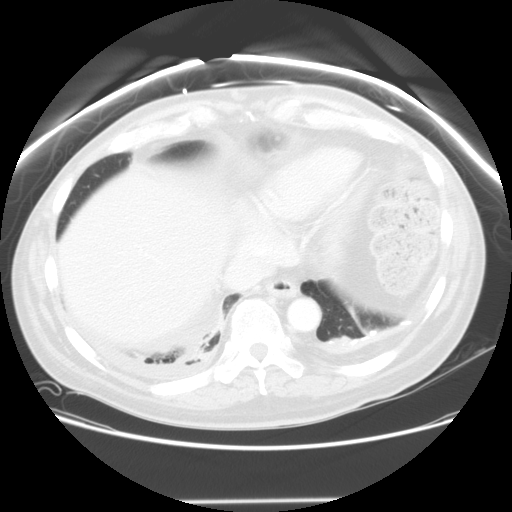

[Series 400: reformatted · sagittal · 1.07mm/px · 3 of 115 slices shown]
[im 13/115  soft-tissue]
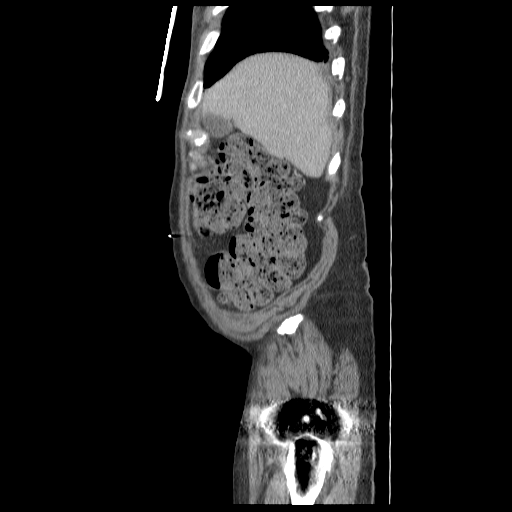
[im 26/115  soft-tissue]
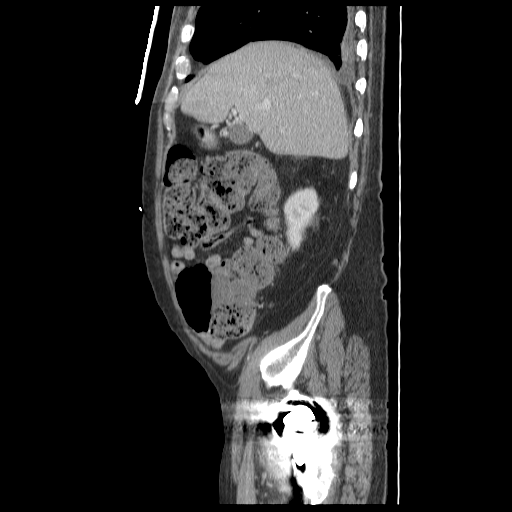
[im 39/115  soft-tissue]
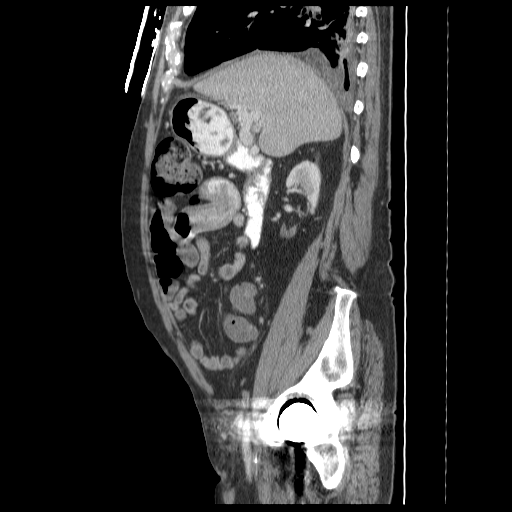

[10 of 32 positions shown; findings below may reference images not displayed]

FINDINGS: A small somewhat loculated right-sided pleural effusion
is noted, with associated right lower lobe atelectasis.  Minimal
emphysematous change is noted within the right lung.  Rounded
atelectasis is noted at the left lung base, with partial left lower
lobe collapse, and diffuse pleural calcification along the
visualized left lung base.  This raises concern for prior asbestos
exposure; clinical correlation is suggested.

Two pacemaker leads are noted ending along the right ventricle. The
visualized portions the mediastinum are grossly unremarkable.

There is prior thrombosis of the portal vein, with cavernous
transformation; extensive diffuse collaterals are seen within the
hepatic hilum.  A small 6 mm hypodensity in segment 7 of the liver
likely reflects a small hepatic cyst. No additional masses are
identified.  The spleen is unremarkable in appearance.  The
gallbladder is within normal limits.

The abdomen is filled with diffusely dilated small and large bowel
loops.  The stomach is filled with contrast and solid material, and
is moderately distended.  Oral contrast extends through the small
bowel, to the level of an apparently thickened segment of proximal
to mid ileum, measuring approximately 16 cm in length. The proximal
loops are distended up to 4.0 cm in diameter.  This diffusely
thickened loop of small bowel may represent the single loop of
thickened small bowel dilated to 6.2 cm on the prior radiograph.
It now measures approximately 3.1 cm in diameter; interval relative
decompression may reflect the flow of contrast through the bowel.

This thickening may also be artifactual in nature, given a small
amount of air noted along the normal-thickness wall anteriorly, in
the midportion of this segment; mixing artifact occasionally has
this appearance.  Alternatively, this could reflect an infectious
or inflammatory process, or malignancy such as lymphoma.

Distal to the thickened loop of proximal ileum, there are
additional mildly dilated loops of small bowel, measuring up to
cm in diameter.  These are filled with fluid and air.  The
transition point is difficult to characterize; there is subsequent
decompression of the mid to distal ileum, although a prominent
segment of distal and terminal ileum is fecalized and mildly
distended.

The colon is completely filled with stool; the cecum is dilated up
to 7.1 cm in diameter, with stool, while the transverse colon
measures up to 6.3 cm, filled with stool, and the sigmoid colon
measures up to 6.9 cm, also filled with stool.  The rectum is
dilated to 8.4 cm in diameter.

There is marked thickening of the anorectal wall, relatively
diffuse in nature and symmetric in appearance.  Given the
concentric appearance of this abnormality, this most likely
reflects a chronic proctitis, secondary to extended impaction of
stool.  Alternatively, this could also represent a mass involving
the anorectal wall.

No free fluid is noted within the abdomen.  The pancreas is grossly
unremarkable in appearance.  The adrenal glands and both kidneys
are within normal limits.  On the right side, note is made of three
ureters combining relatively proximally, on the left side, two
ureters combine proximally.
IMPRESSION: 1.  Marked dilatation of small and large bowel loops; the colon is
completely filled with stool, and there is partial fecalization of
the distal ileum. Question of a thickened small bowel loop in the
proximal to mid ileum, although this could reflect artifact; if
real, this could reflect infection, an inflammatory process or
malignancy such as lymphoma.
2.  Marked thickening of the anorectal wall, relatively diffuse and
symmetric in nature; this most likely reflects a chronic proctitis,
secondary to extended impaction of stool.  Alternatively, this
could also represent a mass involving the anorectal wall.
3. Prior thrombosis of the portal vein, with cavernous
transformation; likely small hepatic cyst.
4.  Small somewhat loculated right-sided pleural effusion, with
right lower lobe atelectasis.  Minimal emphysematous change within
the right lung.
5.  Diffuse pleural calcification along the left lung base, with
associated rounded atelectasis and partial left lower lobe
collapse.  Suggest clinical correlation for prior asbestos
exposure.

CT PELVIS
FINDINGS: No free fluid is noted within the pelvis.  Evaluation of
the pelvis is suboptimal due to metal artifact from bilateral hip
hemiarthroplasties.  As described above, there is dilatation of the
rectum to 8.4 cm in maximal diameter.  Along the distal sigmoid
colon, there is mild surrounding soft tissue stranding, suggestive
of mild inflammation.  No significant diverticulosis is seen.  The
bladder is mildly distended and unremarkable in appearance,
although displaced anteriorly by the distended sigmoid colon.

Note is made of occlusion of the IVC inferior to the level of the
renal veins, with associated calcification in the lumen of the IVC,
and marked diminutive appearance to the common iliac, internal
iliac and external iliac veins.  This reflects diffuse thrombosis
of the venous system inferior to the level of the renal veins.

Scattered mildly prominent mesenteric and periaortic nodes are
seen.  Scattered small inguinal nodes are noted bilaterally,
without evidence of significant inguinal lymphadenopathy.

No acute osseous abnormalities are seen.  As described above, the
patient is status post bilateral hip hemiarthroplasty.  No acute
fractures are seen.  Degenerative change is noted along the lumbar
spine.  There is incomplete fusion of the transverse processes of
L1.  Facet joint hypertrophy is seen.  Disc disease is most severe
at L3-L4, with vacuum phenomenon and near complete loss of the disc
space.
IMPRESSION: 1.  Mild soft tissue stranding surrounding the distal sigmoid
colon, suggestion of mild inflammation in combination with the
patient's fecal impaction.
2.  Complete occlusion of the IVC inferior to the level of the
renal veins, reflecting diffuse chronic thrombosis of the inferior
venous system.
3.  Degenerative disc disease along the lumbar spine, particularly
at L3-L4.

## 2010-03-16 IMAGING — CR DG ABDOMEN 2V
1 series · 1 of 1 positions shown · non-contrast
Comparison: Chest and abdominal radiographs performed 09/25/2008.

CLINICAL DATA: Chronic abdominal pain; assess for bowel
obstruction.

ABDOMEN - 2 VIEW
[id number]

[view not recorded]
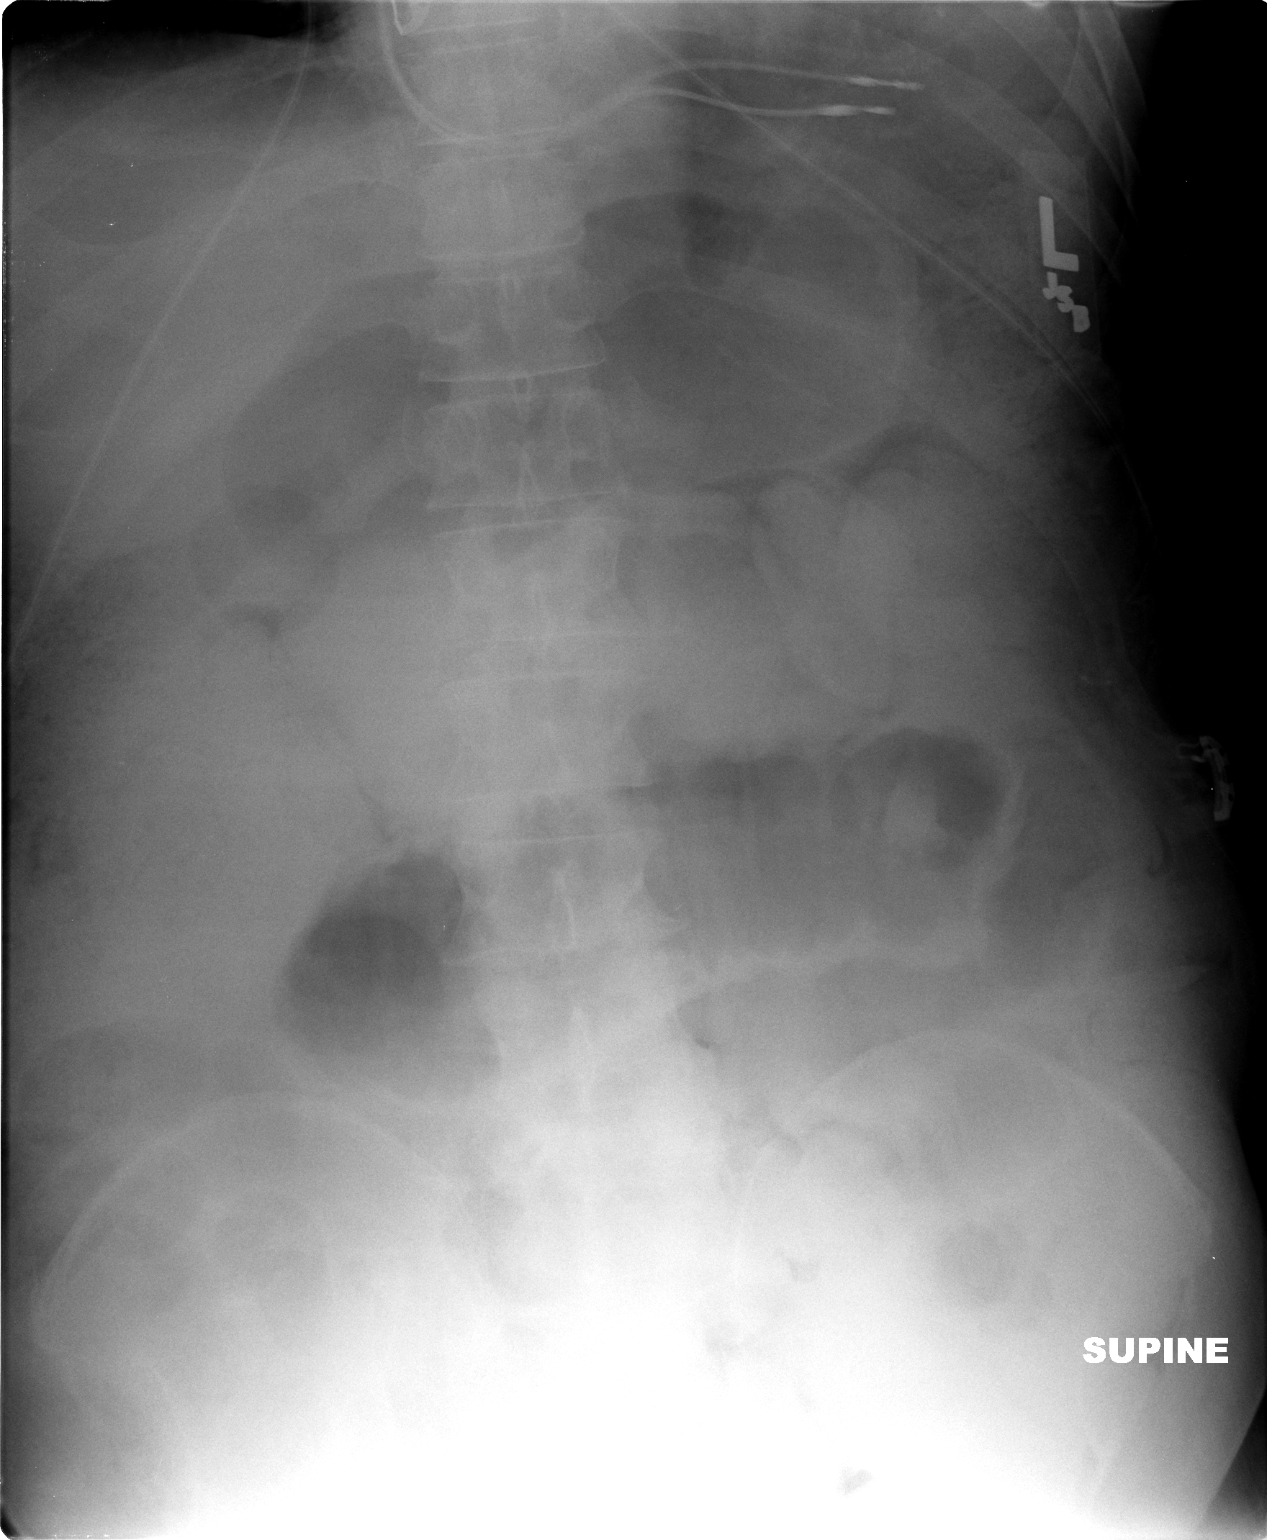

[1 of 1 positions shown; findings below may reference images not displayed]

FINDINGS: There is marked distension of a small bowel loop in the
midabdomen to 6.3 cm in maximal diameter, with significant wall
thickening; additional air and fluid filled loops of small bowel
are appreciated, with large air-fluid levels noted within the bowel
on the decubitus view.  No free intra-abdominal air is seen.  A
large amount of stool is also seen within the colon; the colon is
difficult to assess given a relative lack of air within the colon.

The lung bases are only imaged on the decubitus view; the right
lung base appears essentially clear, but the left lung is difficult
to assess given decubitus positioning.  Pacemaker leads are seen
ending overlying the right ventricle.  No acute osseous
abnormalities are identified.  Degenerative change is characterized
at L5-S1.
IMPRESSION: 1.  Marked distension of small bowel loops, with associated wall
thickening; prominent air-fluid levels identified within the bowel.
Findings are compatible with high-grade small bowel obstruction. No
intra-abdominal free air seen.
2.  Large amount of stool noted within the colon; the colon is
difficult to fully assess on this study.

Findings were discussed with Dr. Elekberli at [DATE] on 12/22/2008.

## 2010-03-17 IMAGING — CR DG CHEST 1V PORT
1 series · 1 of 1 positions shown · non-contrast
Comparison: Chest x-ray 12/21/2008 and chest CT 12/14/2008

CLINICAL DATA: Evaluate PICC place

PORTABLE CHEST - 1 VIEW

[view not recorded]
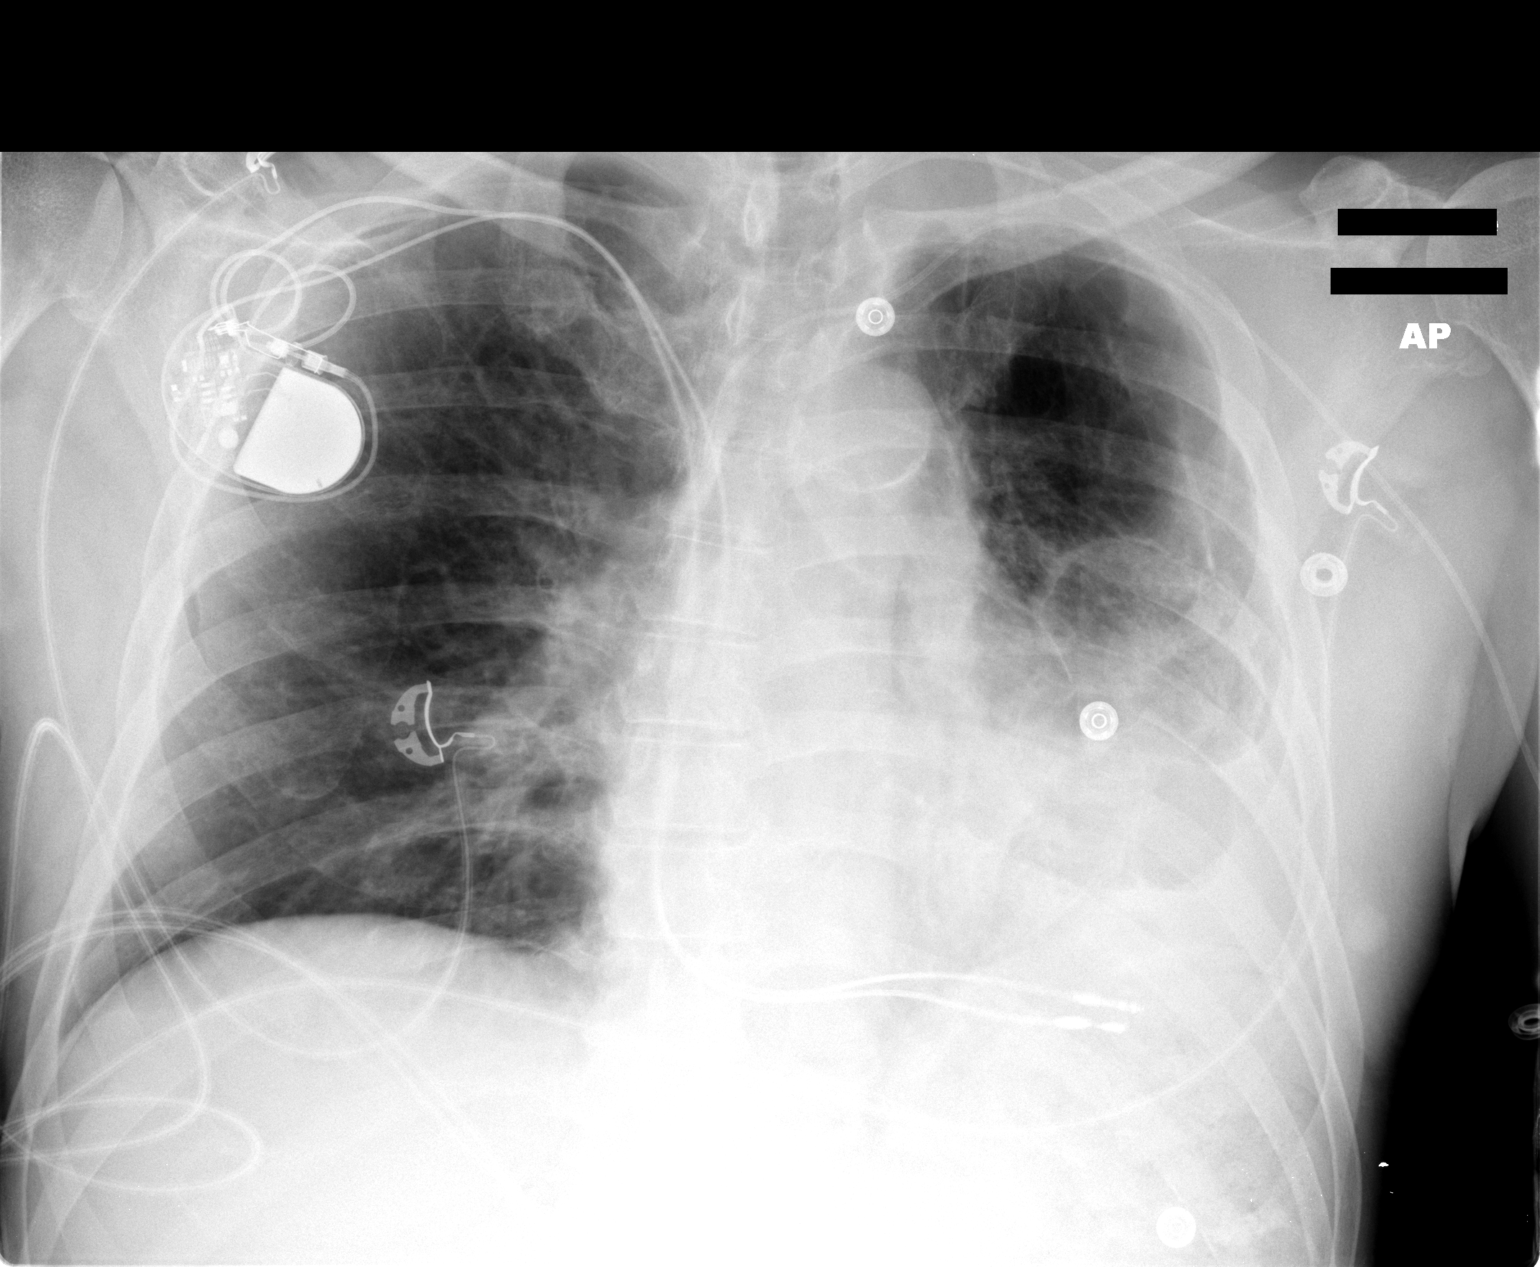

[1 of 1 positions shown; findings below may reference images not displayed]

FINDINGS: A left upper extremity PICC is present.  The patient is
rotated to the left and the catheter projects over the thoracic
spine, slightly limiting evaluation of the distal tip.  The distal
tip is thought to be in the proximal superior vena cava. Catheter
position is slightly more proximal on today's examination, as
compared to 12/21/2008.

Right-sided dual lead pacer is stable. Left basilar opacities,
likely reflecting airspace disease are unchanged.  There is
blunting of the left costophrenic angle may reflect a small left
pleural effusion.  There is chronic pleural thickening or fluid
over the left lung apex. Left-sided pleural calcifications
identified. The right lung remains clear.
IMPRESSION: 1.  Left upper extremity PICC terminates in the proximal superior
vena cava.
2.  No significant change in aeration of the left lung compared to
12/21/2008. Left basilar airspace disease and probable small left
pleural effusion persist.

## 2010-03-18 IMAGING — CR DG ABDOMEN 1V
1 series · 1 of 1 positions shown · non-contrast
Comparison: 12/23/2008.

CLINICAL DATA: Constipation.

ABDOMEN - 1 VIEW

[view not recorded]
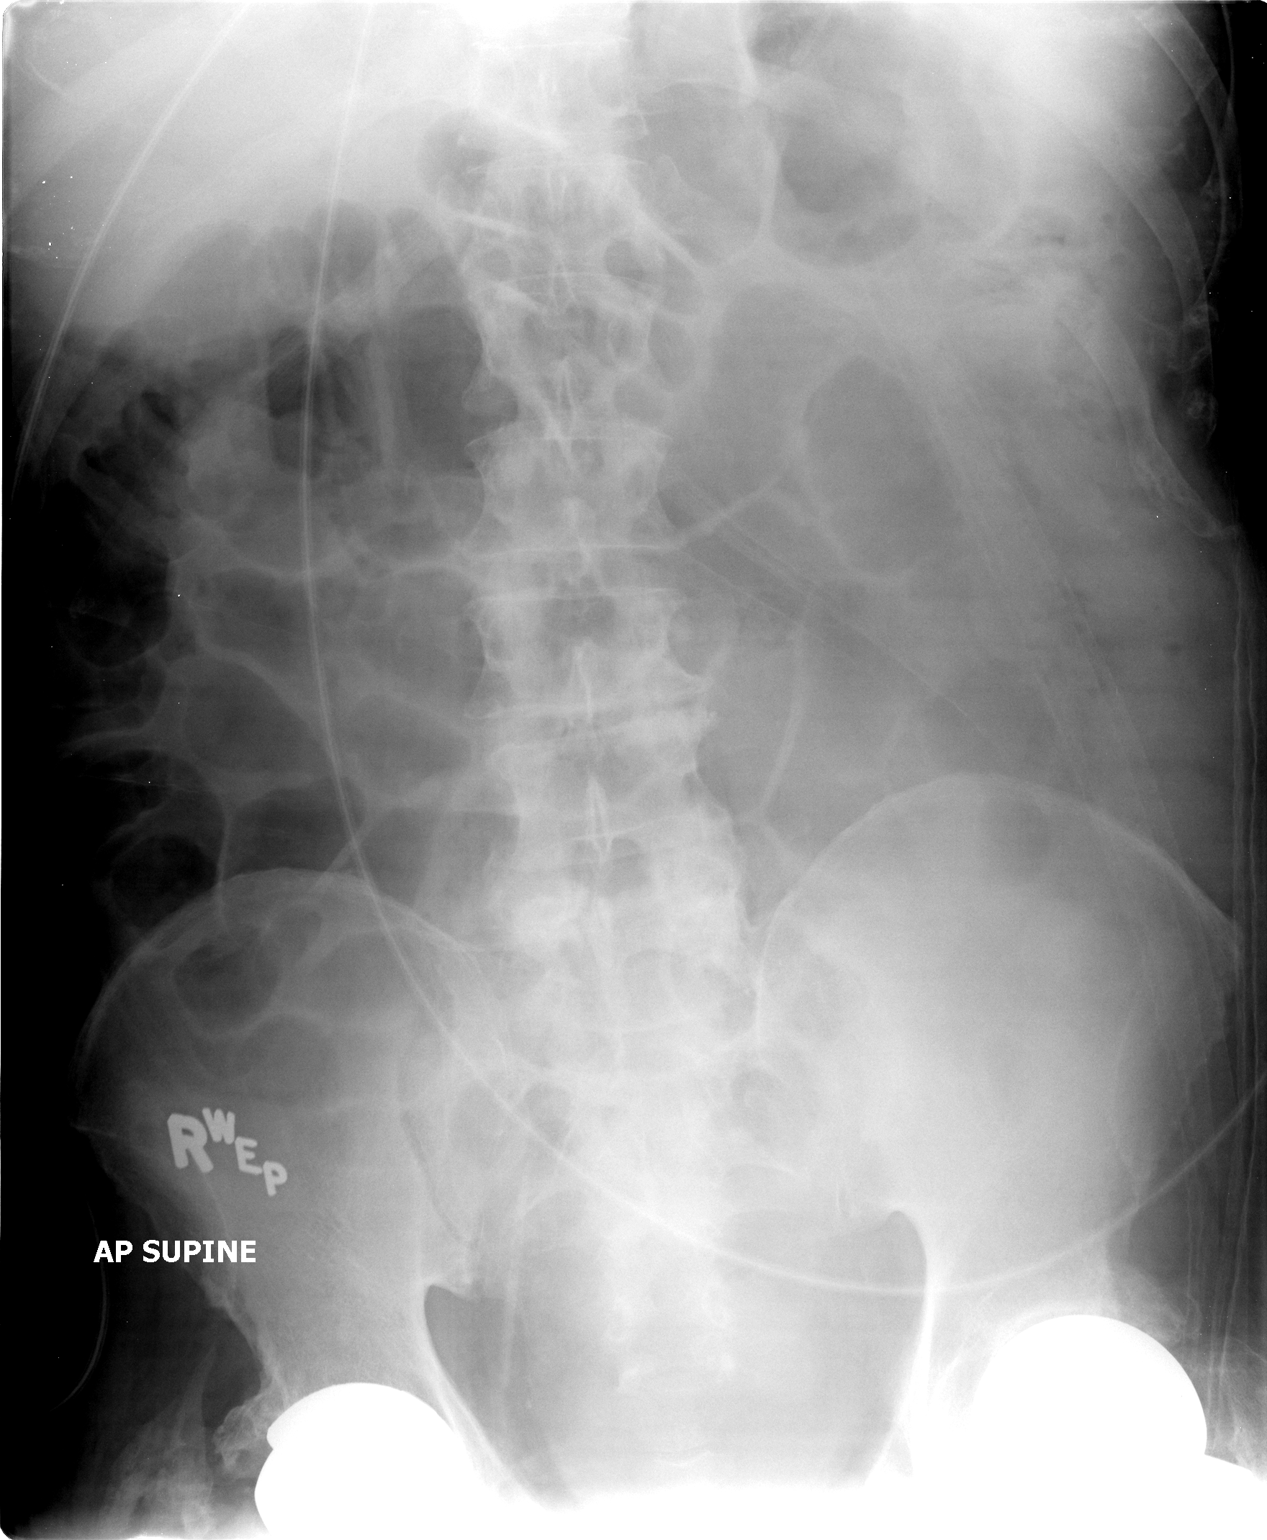

[1 of 1 positions shown; findings below may reference images not displayed]

FINDINGS: Supine abdomen shows diffuse gaseous small bowel
distention, slightly progressed in the interval.  Less stool is
visible in the right colon on today's study.  Degenerative changes
are seen in the lower spine .  The patient has had previous
bilateral hip replacement.
IMPRESSION: Slight increase and gas filled small bowel loops suggests evolving
ileus.  The colonic stool volume seen on yesterday's film appears
decreased in the interval.

## 2010-03-20 ENCOUNTER — Telehealth: Payer: Self-pay | Admitting: *Deleted

## 2010-03-21 ENCOUNTER — Encounter: Payer: Self-pay | Admitting: Internal Medicine

## 2010-03-30 ENCOUNTER — Telehealth: Payer: Self-pay | Admitting: Internal Medicine

## 2010-04-09 ENCOUNTER — Ambulatory Visit: Payer: Self-pay | Admitting: Internal Medicine

## 2010-04-17 ENCOUNTER — Telehealth: Payer: Self-pay | Admitting: *Deleted

## 2010-04-20 ENCOUNTER — Encounter: Payer: Self-pay | Admitting: Internal Medicine

## 2010-05-02 ENCOUNTER — Ambulatory Visit: Payer: Self-pay | Admitting: Internal Medicine

## 2010-05-07 ENCOUNTER — Ambulatory Visit: Payer: Self-pay | Admitting: Internal Medicine

## 2010-05-08 IMAGING — CT CT L SPINE W/O CM
2 of 11 series · 4 of 20 positions shown, 5 images · non-contrast
Comparison: CT 12/22/2008

CLINICAL DATA: Low back pain, bilateral leg and foot numbness

CT LUMBAR SPINE WITHOUT CONTRAST
TECHNIQUE: Multidetector CT imaging of the lumbar spine was
performed without intravenous contrast administration. Multiplanar
CT image reconstructions were also generated.

[Series 2: l spine · axial · 0.27mm/px · z∈[-204,-121]mm · 2 of 101 slices shown, 3 images]
[im 34/101  soft-tissue]
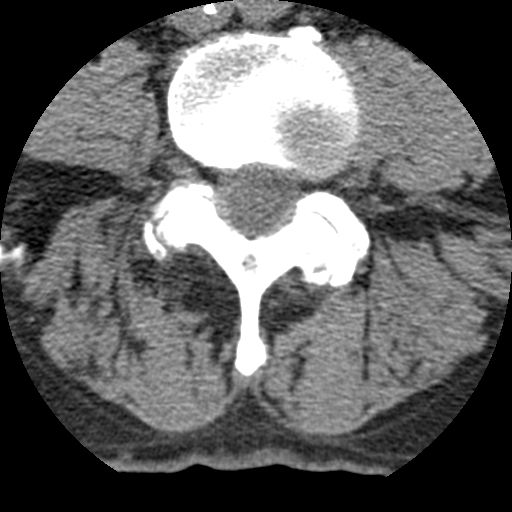
[im 34/101  bone]
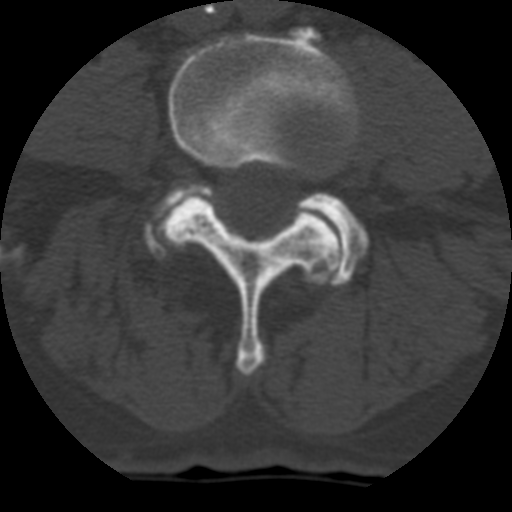
[im 67/101  bone]
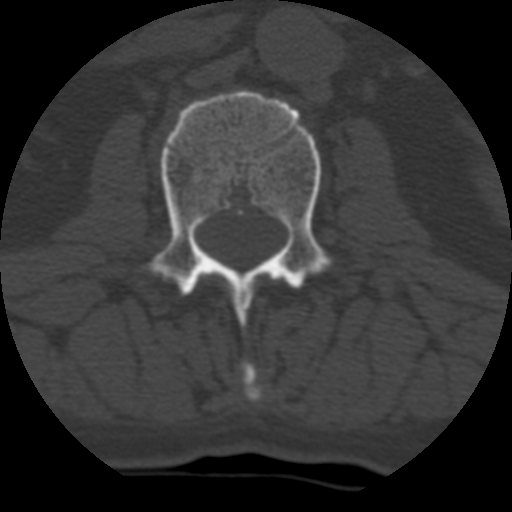

[Series 3: bone windows · axial · 0.27mm/px · z∈[-204,-121]mm · 2 of 101 slices shown]
[im 34/101  bone]
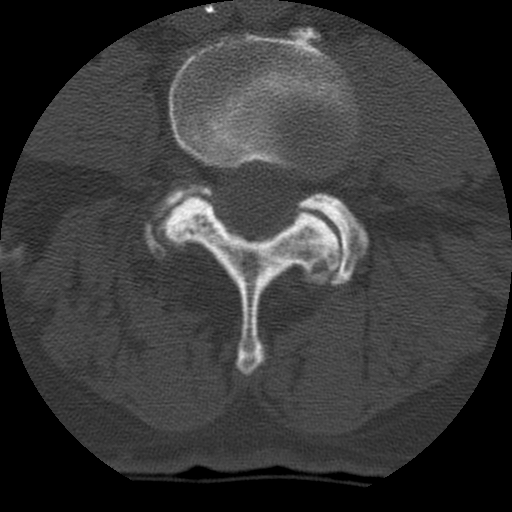
[im 67/101  bone]
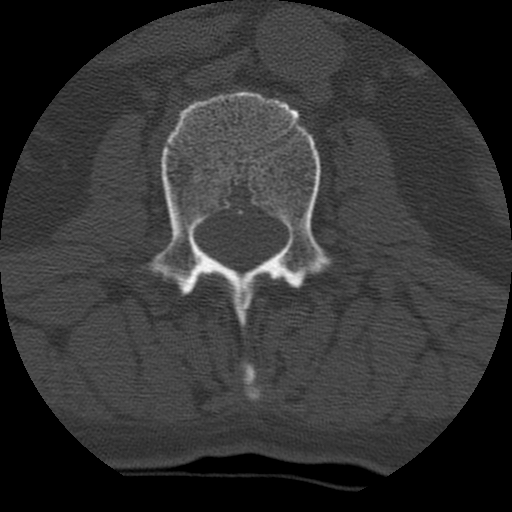

[4 of 20 positions shown; findings below may reference images not displayed]

FINDINGS: There are five non-rib bearing lumbar type vertebral
bodies, labeled L1-L5.  Careful correlation with numbering schemes
of   any outside imaging is recommended before   any intervention
is contemplated.

T12-L1:  Mild narrowing of the interspace with a small Schmorl's
node in the inferior endplate of T12.  No significant bulge or
protrusion.  Central canal and neural foramina widely patent.

L1-2:  Early asymmetric facet degenerative changes, right greater
than left.  Minimal circumferential disc bulge without foraminal or
central canal encroachment.

L2-3:  Mild asymmetric facet degenerative hypertrophy, right
greater than left.  Minimal circumferential disc bulge without
foraminal or central canal encroachment.

L3-4:  Moderate   asymmetric loss of disc height, left worse than
right, with vacuum phenomenon.  Small Schmorl's nodes are noted in
the adjacent endplates to the left of midline.  There is mild left
and moderate right facet degenerative hypertrophy.  There is 4-5 mm
retrolisthesis of L3 on L4.  There is a moderate circumferential
disc bulge contributing to mild central canal stenosis and moderate
subarticular and lateral recess stenosis bilaterally.  There is
asymmetric foraminal stenosis, left worse than right.

L4-5:  Moderate bilateral facet degenerative hypertrophy.  Mild
circumferential disc bulge resulting in minimal encroachment on the
neural foramina.  No spinal stenosis.

L5-S1:  There are bilateral pars defects of L5.  There is moderate
asymmetric facet degenerative hypertrophy, left worse than right
allowing 3-4 mm anterolisthesis of L5 on S1.  This contributes to
bilateral foraminal stenosis.  No significant central canal
stenosis.
IMPRESSION: 1.  Asymmetric facet disease L1-2, L2-3, right worse than left.
2.  Mild multifactorial spinal stenosis L3-4 with bilateral
foraminal, subarticular, and lateral recess stenosis as well as 5
mm retrolisthesis of L3-4.
3.  Bilateral facet disease L4-5 with only minimal foraminal
encroachment.
4.  Bilateral pars defects of L5 with superimposed facet
degenerative changes L5-S1 allowing grade 1 anterolisthesis
resulting in bilateral foraminal stenosis.

## 2010-05-10 ENCOUNTER — Telehealth: Payer: Self-pay | Admitting: Internal Medicine

## 2010-05-21 ENCOUNTER — Telehealth: Payer: Self-pay | Admitting: *Deleted

## 2010-05-22 ENCOUNTER — Telehealth: Payer: Self-pay | Admitting: Internal Medicine

## 2010-05-22 ENCOUNTER — Encounter: Payer: Self-pay | Admitting: Internal Medicine

## 2010-05-28 ENCOUNTER — Ambulatory Visit: Admit: 2010-05-28 | Payer: Self-pay

## 2010-05-30 LAB — CONVERTED CEMR LAB: INR: 4.1

## 2010-06-01 ENCOUNTER — Encounter: Payer: Self-pay | Admitting: Internal Medicine

## 2010-06-01 ENCOUNTER — Inpatient Hospital Stay (HOSPITAL_COMMUNITY)
Admission: AD | Admit: 2010-06-01 | Discharge: 2010-06-03 | Payer: Self-pay | Source: Home / Self Care | Attending: Internal Medicine | Admitting: Internal Medicine

## 2010-06-01 DIAGNOSIS — I428 Other cardiomyopathies: Secondary | ICD-10-CM | POA: Insufficient documentation

## 2010-06-01 LAB — CONVERTED CEMR LAB
ALT: <8 units/L (ref 0–53)
Alkaline Phosphatase: 61 units/L (ref 39–117)
Basophils Absolute: 0 10*3/uL (ref 0.0–0.1)
Basophils Relative: 0 % (ref 0–1)
CO2: 22 meq/L (ref 19–32)
Eosinophils Absolute: 0.4 10*3/uL (ref 0.0–0.7)
MCHC: 32.1 g/dL (ref 30.0–36.0)
MCV: 96.7 fL (ref 78.0–100.0)
Neutrophils Relative %: 55 % (ref 43–77)
Platelets: 201 10*3/uL (ref 150–400)
RDW: 15.2 % (ref 11.5–15.5)
Sed Rate: 4 mm/hr (ref 0–16)
Sodium: 146 meq/L — ABNORMAL HIGH (ref 135–145)
TSH: 2.131 microintl units/mL (ref 0.350–4.50)
Total Bilirubin: 0.4 mg/dL (ref 0.3–1.2)
Total Protein: 6.6 g/dL (ref 6.0–8.3)
WBC: 8 10*3/uL (ref 4.0–10.5)

## 2010-06-03 ENCOUNTER — Encounter: Payer: Self-pay | Admitting: Internal Medicine

## 2010-06-04 LAB — DIFFERENTIAL
Basophils Absolute: 0 10*3/uL (ref 0.0–0.1)
Basophils Relative: 0 % (ref 0–1)
Eosinophils Absolute: 0.4 10*3/uL (ref 0.0–0.7)
Eosinophils Relative: 5 % (ref 0–5)
Lymphocytes Relative: 34 % (ref 12–46)
Lymphs Abs: 2.5 10*3/uL (ref 0.7–4.0)
Monocytes Absolute: 0.5 10*3/uL (ref 0.1–1.0)
Monocytes Relative: 7 % (ref 3–12)
Neutro Abs: 3.9 10*3/uL (ref 1.7–7.7)
Neutrophils Relative %: 53 % (ref 43–77)

## 2010-06-04 LAB — CBC
HCT: 41.2 % (ref 39.0–52.0)
HCT: 37.6 % — ABNORMAL LOW (ref 39.0–52.0)
Hemoglobin: 13.6 g/dL (ref 13.0–17.0)
Hemoglobin: 12.6 g/dL — ABNORMAL LOW (ref 13.0–17.0)
MCH: 32 pg (ref 26.0–34.0)
MCH: 32.1 pg (ref 26.0–34.0)
MCHC: 33 g/dL (ref 30.0–36.0)
MCHC: 33.5 g/dL (ref 30.0–36.0)
MCV: 96.9 fL (ref 78.0–100.0)
MCV: 95.9 fL (ref 78.0–100.0)
Platelets: 152 10*3/uL (ref 150–400)
Platelets: 146 10*3/uL — ABNORMAL LOW (ref 150–400)
RBC: 4.25 MIL/uL (ref 4.22–5.81)
RBC: 3.92 MIL/uL — ABNORMAL LOW (ref 4.22–5.81)
RDW: 14.8 % (ref 11.5–15.5)
RDW: 14.9 % (ref 11.5–15.5)
WBC: 7.3 10*3/uL (ref 4.0–10.5)
WBC: 6.1 10*3/uL (ref 4.0–10.5)

## 2010-06-04 LAB — COMPREHENSIVE METABOLIC PANEL
ALT: <8 U/L (ref 0–53)
AST: 17 U/L (ref 0–37)
Albumin: 3.7 g/dL (ref 3.5–5.2)
Alkaline Phosphatase: 57 U/L (ref 39–117)
BUN: 23 mg/dL (ref 6–23)
CO2: 23 mEq/L (ref 19–32)
Calcium: 9.1 mg/dL (ref 8.4–10.5)
Chloride: 108 mEq/L (ref 96–112)
Creatinine, Ser: 2.04 mg/dL — ABNORMAL HIGH (ref 0.4–1.5)
GFR calc Af Amer: 40 mL/min — ABNORMAL LOW (ref >60)
GFR calc non Af Amer: 33 mL/min — ABNORMAL LOW (ref >60)
Glucose, Bld: 105 mg/dL — ABNORMAL HIGH (ref 70–99)
Potassium: 4.6 mEq/L (ref 3.5–5.1)
Sodium: 137 mEq/L (ref 135–145)
Total Bilirubin: 0.9 mg/dL (ref 0.3–1.2)
Total Protein: 6.1 g/dL (ref 6.0–8.3)

## 2010-06-04 LAB — BASIC METABOLIC PANEL
BUN: 24 mg/dL — ABNORMAL HIGH (ref 6–23)
BUN: 17 mg/dL (ref 6–23)
CO2: 23 mEq/L (ref 19–32)
CO2: 26 mEq/L (ref 19–32)
Calcium: 8.8 mg/dL (ref 8.4–10.5)
Calcium: 8.9 mg/dL (ref 8.4–10.5)
Chloride: 114 mEq/L — ABNORMAL HIGH (ref 96–112)
Chloride: 112 mEq/L (ref 96–112)
Creatinine, Ser: 2 mg/dL — ABNORMAL HIGH (ref 0.4–1.5)
Creatinine, Ser: 1.49 mg/dL (ref 0.4–1.5)
GFR calc Af Amer: 41 mL/min — ABNORMAL LOW (ref >60)
GFR calc Af Amer: 57 mL/min — ABNORMAL LOW (ref >60)
GFR calc non Af Amer: 34 mL/min — ABNORMAL LOW (ref >60)
GFR calc non Af Amer: 47 mL/min — ABNORMAL LOW (ref >60)
Glucose, Bld: 99 mg/dL (ref 70–99)
Glucose, Bld: 83 mg/dL (ref 70–99)
Potassium: 4.9 mEq/L (ref 3.5–5.1)
Potassium: 4.8 mEq/L (ref 3.5–5.1)
Sodium: 142 mEq/L (ref 135–145)
Sodium: 142 mEq/L (ref 135–145)

## 2010-06-04 LAB — PROTIME-INR
INR: 3.25 — ABNORMAL HIGH (ref 0.00–1.49)
INR: 3.63 — ABNORMAL HIGH (ref 0.00–1.49)
INR: 1.77 — ABNORMAL HIGH (ref 0.00–1.49)
Prothrombin Time: 33.2 seconds — ABNORMAL HIGH (ref 11.6–15.2)
Prothrombin Time: 36.1 seconds — ABNORMAL HIGH (ref 11.6–15.2)
Prothrombin Time: 20.8 seconds — ABNORMAL HIGH (ref 11.6–15.2)

## 2010-06-04 LAB — URINALYSIS, ROUTINE W REFLEX MICROSCOPIC
Bilirubin Urine: NEGATIVE
Hgb urine dipstick: NEGATIVE
Ketones, ur: NEGATIVE mg/dL
Nitrite: NEGATIVE
Protein, ur: NEGATIVE mg/dL
Specific Gravity, Urine: 1.023 (ref 1.005–1.030)
Urine Glucose, Fasting: NEGATIVE mg/dL
Urobilinogen, UA: 0.2 mg/dL (ref 0.0–1.0)
pH: 5.5 (ref 5.0–8.0)

## 2010-06-04 LAB — TECHNOLOGIST SMEAR REVIEW

## 2010-06-04 LAB — HEMOGLOBIN A1C
Hgb A1c MFr Bld: 5.6 % (ref <5.7)
Mean Plasma Glucose: 114 mg/dL (ref <117)

## 2010-06-05 DIAGNOSIS — Z7901 Long term (current) use of anticoagulants: Secondary | ICD-10-CM

## 2010-06-05 DIAGNOSIS — I4891 Unspecified atrial fibrillation: Secondary | ICD-10-CM

## 2010-06-05 DIAGNOSIS — I743 Embolism and thrombosis of arteries of the lower extremities: Secondary | ICD-10-CM | POA: Insufficient documentation

## 2010-06-05 DIAGNOSIS — I81 Portal vein thrombosis: Secondary | ICD-10-CM

## 2010-06-07 ENCOUNTER — Telehealth: Payer: Self-pay | Admitting: Internal Medicine

## 2010-06-10 ENCOUNTER — Encounter: Payer: Self-pay | Admitting: Internal Medicine

## 2010-06-13 ENCOUNTER — Ambulatory Visit: Admission: RE | Admit: 2010-06-13 | Discharge: 2010-06-13 | Payer: Self-pay | Source: Home / Self Care

## 2010-06-13 LAB — CONVERTED CEMR LAB
CO2: 26 meq/L (ref 19–32)
Calcium: 9.3 mg/dL (ref 8.4–10.5)
Chloride: 108 meq/L (ref 96–112)
Sodium: 142 meq/L (ref 135–145)

## 2010-06-15 NOTE — Consult Note (Signed)
NAME:  [name], [name] NO.:  [id number]  MEDICAL RECORD NO.:  [id number]           PATIENT TYPE:  LOCATION:                                 FACILITY:  PHYSICIAN:  Jordan Hawks. Elnoria Howard, MD    DATE OF BIRTH:  [birth year]  DATE OF CONSULTATION:  06/02/2010 DATE OF DISCHARGE:                                CONSULTATION   REFERRING PROVIDER:  GI consultation for [facility].  REASON FOR CONSULTATION:  Abnormal CT scan and some left-sided abdominal pain.  This is an unassigned triad hospitalist patient.  HISTORY OF PRESENT ILLNESS:  This is a 65 year old gentleman with a past medical history of atrial fibrillation and atrial flutter with rapid ventricular response, COPD, hypertension, diabetes, gastroesophageal reflux disease, avascular necrosis bilaterally with a hip replacement, history of recurrent DVT, peripheral vascular disease, polysubstance abuse, bipolar disease, depression, and C. diff colitis, was admitted to the hospital with renal insufficiency.  The patient was evaluated in his primary care provider's office and routine blood work was performed. The patient was noted to have an elevation in his creatinine up in the 2 range.  As a result of these findings, she was requested to be admitted to the hospital.  Upon admission, the patient did report having some left-sided discomfort and a CT scan was performed and revealed that there was a symmetric thickening in his rectum and subsequent GI consultation was requested.  The patient denies having issues with diarrhea.  No hematochezia, but he does complain having problems with constipation.  He does use Maalox on a routine basis to help him have effective bowel movements.  There is also some reported history of weight loss, but overall the patient does feel well.  Again, he is on chronic Coumadin therapy and as a result he has not noticed any kind of hematochezia issues.  Past medical history and past surgical  history is as stated above.  FAMILY HISTORY:  Noncontributory.  SOCIAL HISTORY:  Negative for alcohol, tobacco, illicit drug use.  Review of systems is as stated above in history present illness. Otherwise negative.  MEDICATIONS: 1. Aspirin. 2. Diltiazem. 3. Prozac. 4. Megace. 5. Nicotine patch. 6. Protonix. 7. MiraLax. 8. Coumadin protocol. 9. Albuterol. 10.Oxycodone.  Allergy to PENICILLIN.  PHYSICAL EXAMINATION:  VITAL SIGNS:  Blood pressure is 116/72, heart rate is 51, respirations 18, temperature is 98.4. GENERAL:  The patient is in no acute distress, alert and oriented. HEENT:  Normocephalic, atraumatic.  Extraocular muscles intact. NECK:  Supple.  No lymphadenopathy. LUNGS:  Clear to auscultation bilaterally. CARDIOVASCULAR:  Irregularly irregular. ABDOMEN:  Flat, soft.  Minimal left-sided tenderness.  No rebound or rigidity.  Positive bowel sounds. EXTREMITIES:  No clubbing, cyanosis, or edema. RECTAL:  Negative for any palpable masses.  Is heme-negative and there is soft, formed brown stool in the rectal vault.  LABORATORY VALUES:  White blood cell count is 6.1, hemoglobin 12.6, MCV is 95.9, platelets 146.  PT is 36.1, INR 3.63.  Sodium is 142, potassium 4.9, chloride 114, CO2 of 23, glucose 99, BUN 24, creatinine 2.0.  IMPRESSION: 1. Abnormal CT scan. 2.  Weight loss. 3. Vague left-sided abdominal pain.  Currently, the patient appears to be stable.  Rectal exam was unrevealing.  I believe that this symmetric thickening may be as a result of his constipation issues with hypertrophy of the muscle in that area.  On comparison of prior CT scan, there does not appear to be any significant change in the rectal area.  Additionally, the prior notes of some dilation and in the terminal ileum has now currently resolved, but given the current findings, it is not unreasonable for the patient to undergo an endoscopic examination.  It does not appear that he had  a colonoscopy in the past.  Plan at this time is to set up for colonoscopy once the patient's INR is corrected and further recommendations will be made pending the findings.     Jordan Hawks Elnoria Howard, MD     PDH/MEDQ  D:  06/03/2010  T:  06/04/2010  Job:  [id number]  Electronically Signed by Jeani Hawking MD on 06/13/2010 07:17:51 AM

## 2010-06-18 ENCOUNTER — Ambulatory Visit: Admit: 2010-06-18 | Payer: Self-pay

## 2010-06-19 NOTE — Assessment & Plan Note (Signed)
Summary: COU/CH  Anticoagulant Therapy Managed by: Barbera Setters. Janie Morning  PharmD CACP PCP: Margarito Liner MD [facility] Attending: Lowella Bandy MD Indication 1: Atrial fibrillation Indication 2: Encounter for therapeutic drug monitoring  V58.83 Usual Lab: [facility] Start date: 08/04/2008  Patient Assessment Reviewed by: Chancy Milroy PharmD  November 27, 2009 Medication review: verified warfarin dosage & schedule,verified previous prescription medications, verified doses & any changes, verified new medications, reviewed OTC medications, reviewed OTC health products-vitamins supplements etc Complications: none Dietary changes: none   Health status changes: none   Lifestyle changes: none   Recent/future hospitalizations: none   Recent/future procedures: none   Recent/future dental: none Patient Assessment Part 2:  Have you MISSED ANY DOSES or CHANGED TABLETS?  YES. States missed a couple of days worth of warfarin.  Have you had any BRUISING or BLEEDING ( nose or gum bleeds,blood in urine or stool)?  No reported bruising or bleeding in nose, gums, urine, stool.  Have you STARTED or STOPPED any MEDICATIONS, including OTC meds,herbals or supplements?  No other medications or herbal supplements were started or stopped.  Have you CHANGED your DIET, especially green vegetables,or ALCOHOL intake?  No changes in diet or alcohol intake.  Have you had any ILLNESSES or HOSPITALIZATIONS?  No reported illnesses or hospitalizations  Have you had any signs of CLOTTING?(chest discomfort,dizziness,shortness of breath,arms tingling,slurred speech,swelling or redness in leg)    No chest discomfort, dizziness, shortness of breath, tingling in arm, slurred speech, swelling, or redness in leg.     Treatment  Target INR: 2.0-3.0 INR: 1.5  Date: 11/27/2009 Regimen In:  60.0mg /week INR reflects regimen in: 1.5  New  Tablet strength: : 5mg  Regimen Out:     Sunday: 2 Tablet     Monday: 1 & 1/2 Tablet     Tuesday:  2 Tablet     Wednesday: 2 Tablet     Thursday: 1 & 1/2 Tablet      Friday: 2 Tablet     Saturday: 2 Tablet Total Weekly: 65.0mg /week mg  Next INR Due: 12/18/2009 Adjusted by: Barbera Setters. [place] Lodge III PharmD CACP   Return to anticoagulation clinic:  12/18/2009 Time of next visit: 1415    Allergies: 1)  ! Pcn

## 2010-06-19 NOTE — Assessment & Plan Note (Signed)
Summary: Social Work/11/03/09   Social Work Evaluation Date  11/03/2009 Patient name [name]  Primary MD   : Margarito Liner MD Social Worker's name : Dorothe Pea MSW- LCSW  Home [phone]  Work phone: [phone]  Cell phone: .  [name]     Alternate phone: . [name]       Individual making referral: Physician  Primary Reason for Referral:      Smoking Cessation Counseling Comments Patient wanting to quit smoking/contemplation stage.  He started up about a year ago after the stress of living alone again.  He quit for two years prior to that.  He used the patch when he was in the hospital. He is smoking more than 10 cigarettes per day.  He has smoked since he was a teenager.   He smokes indoors.  He still drives a cab part time.  He admits to being lonely since his girlfriend is no longer in picture.  Action taken by Social Work: Counseled patient. Encouraged him to prepare his home for quitting by cleaning and making smokefree, smoking outside.  The patient is motivated to quit in September at his next birthday. Encouraged connection to Quitline and to use patches once again as this will significantly increase his success. Resources given for [employer] at [place] and also [employer] due to social isolation.   We will meet again at that time to prepare him for quit date.  Quit Smoking handout given to him to read as homework.  SW followup in September.

## 2010-06-19 NOTE — Assessment & Plan Note (Signed)
Summary: [facility]  Anticoagulant Therapy Managed by: Barbera Setters. Janie Morning  PharmD CACP PCP: Jason Coop MD [facility] AttendingCoralee Pesa MD, Levada Schilling Indication 1: Atrial fibrillation Indication 2: Encounter for therapeutic drug monitoring  V58.83 Usual Lab: [facility] Start date: 08/04/2008  Patient Assessment Reviewed by: Chancy Milroy PharmD  October 23, 2009 Medication review: verified warfarin dosage & schedule,verified previous prescription medications, verified doses & any changes, verified new medications, reviewed OTC medications, reviewed OTC health products-vitamins supplements etc Complications: none Dietary changes: none   Health status changes: none   Lifestyle changes: none   Recent/future hospitalizations: none   Recent/future procedures: none   Recent/future dental: none Patient Assessment Part 2:  Have you MISSED ANY DOSES or CHANGED TABLETS?  No missed Warfarin doses or changed tablets.  Have you had any BRUISING or BLEEDING ( nose or gum bleeds,blood in urine or stool)?  No reported bruising or bleeding in nose, gums, urine, stool.  Have you STARTED or STOPPED any MEDICATIONS, including OTC meds,herbals or supplements?  No other medications or herbal supplements were started or stopped.  Have you CHANGED your DIET, especially green vegetables,or ALCOHOL intake?  No changes in diet or alcohol intake.  Have you had any ILLNESSES or HOSPITALIZATIONS?  No reported illnesses or hospitalizations  Have you had any signs of CLOTTING?(chest discomfort,dizziness,shortness of breath,arms tingling,slurred speech,swelling or redness in leg)    No chest discomfort, dizziness, shortness of breath, tingling in arm, slurred speech, swelling, or redness in leg.     Treatment  Target INR: 2.0-3.0 INR: 3.4  Date: 10/23/2009 Regimen In:  62.5mg /week INR reflects regimen in: 3.4  New  Tablet strength: : 5mg  Regimen Out:     Sunday: 1 & 1/2 Tablet     Monday: 2 Tablet  Tuesday: 1 & 1/2 Tablet     Wednesday: 2 Tablet     Thursday: 1 & 1/2 Tablet      Friday: 2 Tablet     Saturday: 1 & 1/2 Tablet Total Weekly: 60.0mg /week mg  Next INR Due: 11/27/2009 Adjusted by: Barbera Setters. Alexandria Lodge III PharmD CACP   Return to anticoagulation clinic:  11/27/2009 Time of next visit: 1415    Allergies: 1)  ! Pcn

## 2010-06-19 NOTE — Letter (Signed)
Summary: Generic Letter  [facility]  [address]   [place], [place] [address]   Phone: [phone]  Fax: [phone]    11/28/2009  RANON COVEN [address] [address] [place], [place]  [address]  Dear Mr. Sanger,  I am writing in response to your question regarding the indication for Megace (megestrol)prescribed in our clinic from April, 2008 to March, 2010.  Review of your record indicates that the Megace was prescribed to treat weight loss and loss of appetite, not as treatment for an established diagnosis of cancer.  Sincerely,   Margarito Liner MD

## 2010-06-19 NOTE — Consult Note (Signed)
Summary: [place]   [place]   Imported By: Roderic Ovens 03/14/2010 12:31:24  _____________________________________________________________________  External Attachment:    Type:   Image     Comment:   External Document

## 2010-06-19 NOTE — Progress Notes (Signed)
Summary: refill/gg  Phone Note Refill Request  on December 19, 2009 3:15 PM  Refills Requested: Medication #1:  MULTAQ 400 MG TABS Take 1 tablet by mouth bid  a day   Last Refilled: 11/04/2009  Method Requested: Electronic Initial call taken by: Merrie Roof RN,  December 19, 2009 3:16 PM  Follow-up for Phone Call        Refilled electronically.  Follow-up by: Margarito Liner MD,  December 21, 2009 10:11 AM    New/Updated Medications: MULTAQ 400 MG TABS (DRONEDARONE HCL) Take 1 tablet by mouth two times a day  Prescriptions: MULTAQ 400 MG TABS (DRONEDARONE HCL) Take 1 tablet by mouth two times a day  #60 x 5   Entered and Authorized by:   Margarito Liner MD   Signed by:   Margarito Liner MD on 12/21/2009   Method used:   [facility]        [facility] [address] [phone]* (retail)       [address]       [place], [place]  [address]       Ph: [phone] or [phone]       Fax: [phone]   RxID:   [id number]

## 2010-06-19 NOTE — Assessment & Plan Note (Signed)
Summary: per dr joines/pcp-joines/hla  Anticoagulant Therapy Managed by: Barbera Setters. Janie Morning  PharmD CACP PCP: Margarito Liner MD [facility] Attending: Rogelia Boga MD, Lanora Manis Indication 1: Atrial fibrillation Indication 2: Encounter for therapeutic drug monitoring  V58.83 Usual Lab: [facility] Start date: 08/04/2008  Patient Assessment Reviewed by: Chancy Milroy PharmD  April 09, 2010 Medication review: verified warfarin dosage & schedule,verified previous prescription medications, verified doses & any changes, verified new medications, reviewed OTC medications, reviewed OTC health products-vitamins supplements etc Complications: none Dietary changes: none   Health status changes: none   Lifestyle changes: none   Recent/future hospitalizations: none   Recent/future procedures: none   Recent/future dental: none Patient Assessment Part 2:  Have you MISSED ANY DOSES or CHANGED TABLETS?  No missed Warfarin doses or changed tablets.  Have you had any BRUISING or BLEEDING ( nose or gum bleeds,blood in urine or stool)?  No reported bruising or bleeding in nose, gums, urine, stool.  Have you STARTED or STOPPED any MEDICATIONS, including OTC meds,herbals or supplements?  No other medications or herbal supplements were started or stopped.  Have you CHANGED your DIET, especially green vegetables,or ALCOHOL intake?  No changes in diet or alcohol intake.  Have you had any ILLNESSES or HOSPITALIZATIONS?  No reported illnesses or hospitalizations  Have you had any signs of CLOTTING?(chest discomfort,dizziness,shortness of breath,arms tingling,slurred speech,swelling or redness in leg)    No chest discomfort, dizziness, shortness of breath, tingling in arm, slurred speech, swelling, or redness in leg.     Treatment  Target INR: 2.0-3.0 INR: 2.0  Date: 04/09/2010 Regimen In:  65.0mg /week INR reflects regimen in: 2.0  New  Tablet strength: : 5mg  Regimen Out:     Sunday: 2 Tablet     Monday: 2  Tablet     Tuesday: 2 Tablet     Wednesday: 2 Tablet     Thursday: 2 Tablet      Friday: 2 Tablet     Saturday: 2 Tablet Total Weekly: 70.0mg /week mg  Next INR Due: 05/07/2010 Adjusted by: Barbera Setters. Alexandria Lodge III PharmD CACP   Return to anticoagulation clinic:  05/07/2010 Time of next visit: 1415    Allergies: 1)  ! Pcn

## 2010-06-19 NOTE — Progress Notes (Signed)
Summary: med refill/gp  Phone Note Refill Request Message from:  Fax from Pharmacy on August 04, 2009 9:28 AM  Refills Requested: Medication #1:  PREVACID 30 MG CPDR take one daily   Last Refilled: 04/28/2008  Method Requested: Electronic Initial call taken by: Chinita Pester RN,  August 04, 2009 9:28 AM  Follow-up for Phone Call       Follow-up by: Jason Coop MD,  August 04, 2009 10:47 AM    Prescriptions: PREVACID 30 MG CPDR (LANSOPRAZOLE) take one daily  #31 Capsule x 5   Entered and Authorized by:   Jason Coop MD   Signed by:   Jason Coop MD on 08/04/2009   Method used:   [facility]ronically to        [facility] [address] [phone]* (retail)       [address]       [place], [place]  [address]       Ph: [phone] or [phone]       Fax: [phone]   RxID:   [id number]

## 2010-06-19 NOTE — Assessment & Plan Note (Signed)
Summary: [facility]  Anticoagulant Therapy Managed by: Barbera Setters. [name]  PharmD CACP PCP: Jason Coop MD [facility] Attending: Lowella Bandy MD Indication 1: Atrial fibrillation Indication 2: Encounter for therapeutic drug monitoring  V58.83 Usual Lab: [facility] Start date: 08/04/2008  Patient Assessment Reviewed by: Chancy Milroy PharmD  September 04, 2009 Medication review: verified warfarin dosage & schedule,verified previous prescription medications, verified doses & any changes, verified new medications, reviewed OTC medications, reviewed OTC health products-vitamins supplements etc Complications: none Dietary changes: none   Health status changes: none   Lifestyle changes: none   Recent/future hospitalizations: none   Recent/future procedures: none   Recent/future dental: none Patient Assessment Part 2:  Have you MISSED ANY DOSES or CHANGED TABLETS?  No missed Warfarin doses or changed tablets.  Have you had any BRUISING or BLEEDING ( nose or gum bleeds,blood in urine or stool)?  No reported bruising or bleeding in nose, gums, urine, stool.  Have you STARTED or STOPPED any MEDICATIONS, including OTC meds,herbals or supplements?  No other medications or herbal supplements were started or stopped.  Have you CHANGED your DIET, especially green vegetables,or ALCOHOL intake?  No changes in diet or alcohol intake.  Have you had any ILLNESSES or HOSPITALIZATIONS?  No reported illnesses or hospitalizations  Have you had any signs of CLOTTING?(chest discomfort,dizziness,shortness of breath,arms tingling,slurred speech,swelling or redness in leg)    No chest discomfort, dizziness, shortness of breath, tingling in arm, slurred speech, swelling, or redness in leg.     Treatment  Target INR: 2.0-3.0 INR: 1.1  Date: 09/04/2009 Regimen In:  47.5mg /week INR reflects regimen in: 1.1  New  Tablet strength: : 5mg  Regimen Out:     Sunday: 1 & 1/2 Tablet     Monday: 2 Tablet     Tuesday: 1 &  1/2 Tablet     Wednesday: 1 & 1/2 Tablet     Thursday: 2 Tablet      Friday: 1 & 1/2 Tablet     Saturday: 1 & 1/2 Tablet Total Weekly: 57.5mg /week mg  Next INR Due: 09/18/2009 Adjusted by: Barbera Setters. Alexandria Lodge III PharmD CACP   Return to anticoagulation clinic:  09/18/2009 Time of next visit: 1545    Allergies: 1)  ! Pcn

## 2010-06-19 NOTE — Medication Information (Signed)
Summary: PERCOCET  PERCOCET   Imported By: Margie Billet 04/24/2010 09:47:27  _____________________________________________________________________  External Attachment:    Type:   Image     Comment:   External Document

## 2010-06-19 NOTE — Miscellaneous (Signed)
[facility]: [facility]   Imported By: Margie Billet 09/15/2009 14:36:10  _____________________________________________________________________  External Attachment:    Type:   Image     Comment:   External Document

## 2010-06-19 NOTE — Assessment & Plan Note (Signed)
Summary: not feeling well per mamie/pcp-joines/hla   Vital Signs:  Patient profile:   65 year old male Height:      72.5 inches (184.15 cm) O2 Sat:      100 % on 2 L/min Temp:     96.8 degrees F (36 degrees C) oral Pulse rate:   72 / minute  Vitals Entered By: Angelina Ok RN (February 05, 2010 1:50 PM)  O2 Flow:  2 L/min  Primary Care Provider:  Margarito Liner MD   History of Present Illness: Patient was not seen by me. As soon as he got to the clinic and was evaluated by the nurse he was  transferred to the ED and was admitted to the [facility]   Allergies: 1)  ! Pcn   Complete Medication List: 1)  Baby Aspirin 81 Mg Chew (Aspirin) .... Once daily 2)  Percocet 10-650 Mg Tabs (Oxycodone-acetaminophen) .... Take one tab by mouth once every  6  hours as needed for pain 3)  Warfarin Sodium 5 Mg Tabs (Warfarin sodium) .... Take as directed. 4)  Multaq 400 Mg Tabs (Dronedarone hcl) .... Take 1 tablet by mouth two times a day 5)  Cardizem Cd 240 Mg Xr24h-cap (Diltiazem hcl coated beads) .... Take 1 capsule by mouth two times a day 6)  Miralax Powd (Polyethylene glycol 3350) .... Once a day 7)  Atenolol 25 Mg Tabs (Atenolol) .... Take 1 pill by mouth daily. 8)  Omeprazole 40 Mg Cpdr (Omeprazole) .... Take 1 tablet by mouth once a day 9)  Sumatriptan Succinate 50 Mg Tabs (Sumatriptan succinate) .... Take one tablet by mouth once you feel your headache is approaching; may take the second tablet 2 hours later. 10)  Citalopram Hydrobromide 40 Mg Tabs (Citalopram hydrobromide) .... Take 1 tablet by mouth once a day

## 2010-06-19 NOTE — Progress Notes (Signed)
Summary: Refill/gh  Phone Note Refill Request Message from:  Patient on April 17, 2010 4:05 PM  Refills Requested: Medication #1:  PERCOCET 10-650 MG  TABS take one tab by mouth once every  6  hours as needed for pain   Last Refilled: 03/22/2010  Method Requested: Pick up at Office Initial call taken by: Valentina Gu,  April 17, 2010 4:05 PM  Follow-up for Phone Call        Rx printed and signed - nurse to complete. Follow-up by: Margarito Liner MD,  April 20, 2010 3:31 PM  Additional Follow-up for Phone Call Additional follow up Details #1::        Rx given to patient on 12/2 Additional Follow-up by: Merrie Roof RN,  April 24, 2010 2:47 PM    Prescriptions: PERCOCET 10-650 MG  TABS (OXYCODONE-ACETAMINOPHEN) take one tab by mouth once every  6  hours as needed for pain  #120 x 0   Entered and Authorized by:   Margarito Liner MD   Signed by:   Margarito Liner MD on 04/20/2010   Method used:   Print then Give to Patient   RxID:   [id number]

## 2010-06-19 NOTE — Progress Notes (Signed)
Summary: omeprazole rx//kg  Phone Note Refill Request Message from:  Patient on November 14, 2009 4:40 PM  Refills Requested: Medication #1:  OMEPRAZOLE 40 MG CPDR Take 1 tablet by mouth once a day.  Method Requested: Electronic Initial call taken by: Angelina Ok RN,  November 14, 2009 4:40 PM  Follow-up for Phone Call        [facility] [address].  Pt already has a rx at pharmacy.  States medicine was never picked up, so they put it back.  Attempted to contact pt and make him aware that if he would like med to be filled, he would need to contact the pharmacy and let them know to fill it.  Pt had originally called to get PA on nexium, but chart reflects pt should be on the omeprazole (prilosec) not the nexium.  Pt has 2 different pharmacys listed on chart, and he was instructed to let the office know if he no longer uses the [facility] [address].  Message left for pt on recorder.Cynda Familia [facility])  November 15, 2009 11:12 AM

## 2010-06-19 NOTE — Progress Notes (Signed)
Summary: REfill/gh  Phone Note Refill Request Message from:  Patient on January 19, 2010 12:11 PM  Refills Requested: Medication #1:  PERCOCET 10-650 MG  TABS take one tab by mouth once every  6  hours as needed for pain  Method Requested: Pick up at Office Initial call taken by: Angelina Ok RN,  January 19, 2010 12:11 PM  Follow-up for Phone Call        Was given at MD's appt today Follow-up by: Blanch Media MD,  January 19, 2010 1:01 PM

## 2010-06-19 NOTE — Progress Notes (Signed)
Summary: Overnight Home Oximetry Order  Phone Note Outgoing Call   Call placed by: [facility] NT II,  January 04, 2010 12:12 PM Call placed to: [facility] Request: Send information Details for Reason: NEED ORDER FOR  Summary of Call: DR. Pryor Montes SPOKE WITH [facility], I NEED YOUR OFFICIE NOTES COMPLETE AND A ORDER FOR OVERNIGHT PULSE OXSEMITRY AND THEN I WILL FAX THE ORDER AND OFFICIE NOTES TO [id number]. Initial call taken by: Filomena Jungling NT II,  January 04, 2010 12:16 PM  Follow-up for Phone Call        I ordered overnight pulse oximetry under the order type "Pulmonary Other"; please send a copy of the order and recent note to [facility] in order to arrange overnight home pulse oximetry on room air. Follow-up by: Margarito Liner MD,  January 05, 2010 6:08 PM

## 2010-06-19 NOTE — Progress Notes (Signed)
Summary: refill/ hla  Phone Note Refill Request Message from:  Patient on November 27, 2009 10:53 AM  Refills Requested: Medication #1:  OMEPRAZOLE 40 MG CPDR Take 1 tablet by mouth once a day.   Dosage confirmed as above?Dosage Confirmed last visit 6/15  Initial call taken by: Marin Roberts RN,  November 27, 2009 10:53 AM  Follow-up for Phone Call        Refilled electronically.  Follow-up by: Margarito Liner MD,  November 27, 2009 12:41 PM    Prescriptions: OMEPRAZOLE 40 MG CPDR (OMEPRAZOLE) Take 1 tablet by mouth once a day  #30 x 11   Entered and Authorized by:   Margarito Liner MD   Signed by:   Margarito Liner MD on 11/27/2009   Method used:   [facility]        [facility] [address] [phone]* (retail)       [address]       [place], [place]  [address]       Ph: [phone] or [phone]       Fax: [phone]   RxID:   [id number]

## 2010-06-19 NOTE — Progress Notes (Signed)
Summary: refill/ hla  Phone Note Refill Request Message from:  Patient on October 23, 2009 9:20 AM  Refills Requested: Medication #1:  PERCOCET 10-650 MG  TABS take one tab by mouth once every  6  hours as needed for pain   Dosage confirmed as above?Dosage Confirmed   Last Refilled: 5/6 11/01/2009 next appt  Initial call taken by: Marin Roberts RN,  October 23, 2009 9:20 AM  Follow-up for Phone Call        Rx picked up by pt. Follow-up by: Chinita Pester RN,  October 23, 2009 2:06 PM    Prescriptions: PERCOCET 10-650 MG  TABS (OXYCODONE-ACETAMINOPHEN) take one tab by mouth once every  6  hours as needed for pain  #120 x 0   Entered and Authorized by:   Zoila Shutter MD   Signed by:   Zoila Shutter MD on 10/23/2009   Method used:   Print then Give to Patient   RxID:   [id number]

## 2010-06-19 NOTE — Progress Notes (Signed)
Summary: F/U Hyperthyroidism  Phone Note Outgoing Call   Call placed by: Margarito Liner MD,  September 07, 2009 3:37 PM Call placed to: Patient Summary of Call: I called patient to discuss the results of his thyroid studies.  See A/P below. Initial call taken by: Margarito Liner MD,  September 07, 2009 3:38 PM      Impression & Recommendations:  Problem # 1:  HYPERTHYROIDISM (ICD-242.90) Recent labs 3/30 show TSH and free T4 now within normal range.  Thyroid scan 08/28/2009 was normal with no focal areas of abnormal increased or decreased activity seen; the uptake of I 131 sodium iodide at 24 hours was 5.7%, with normal range of 15 - 35%.  These findings suggest that patient's previously noted low TSH and elevated free T4 were due to thyroiditis.  The situation is complicated by the fact that patient was previously on amiodarone for atrial fibrillation, but the amiodarone was stopped in August of 2010 because it was not effective and he was switched then to dronedarone (Multaq); his TSH on 12/22/2008 was 3.621around the time that amiodarone was stopped, so he did not appear to be frankly hyperthyroid at that time.  I discussed the results with patient; he has an appointment with endocrinologist Dr. Talmage Coin on May 5, and I advised him to keep that appointment.   His updated medication list for this problem includes:    Atenolol 25 Mg Tabs (Atenolol) .[name]... Take 1 pill by mouth daily.  Labs Reviewed: TSH: 1.380 (08/16/2009)    Free T4: 1.60 ng/dL  (16/02/9603)

## 2010-06-19 NOTE — Progress Notes (Signed)
Summary: refill/gg  Phone Note Refill Request  on November 03, 2009 3:19 PM  Refills Requested: Medication #1:  ATENOLOL 25 MG TABS take 1 pill by mouth daily.   Last Refilled: 10/04/2009  Method Requested: Electronic Initial call taken by: Merrie Roof RN,  November 03, 2009 3:19 PM  Follow-up for Phone Call        Refilled electronically.  Follow-up by: Margarito Liner MD,  November 03, 2009 4:05 PM    Prescriptions: ATENOLOL 25 MG TABS (ATENOLOL) take 1 pill by mouth daily.  #30 x 6   Entered and Authorized by:   Margarito Liner MD   Signed by:   Margarito Liner MD on 11/03/2009   Method used:   Electronically to        [facility] [address] [phone]* (retail)       [address]       [place], [place]  [address]       Ph: [phone] or [phone]       Fax: [phone]   RxID:   [id number]

## 2010-06-19 NOTE — Medication Information (Signed)
Summary: [employer]   Imported By: Louretta Parma 12/28/2009 15:50:23  _____________________________________________________________________  External Attachment:    Type:   Image     Comment:   External Document

## 2010-06-19 NOTE — Progress Notes (Signed)
Summary: refill/ hla  Phone Note Refill Request Message from:  Patient on January 18, 2010 9:58 AM  Refills Requested: Medication #1:  PERCOCET 10-650 MG  TABS take one tab by mouth once every  6  hours as needed for pain   Dosage confirmed as above?Dosage Confirmed   Last Refilled: 8/4 Initial call taken by: Marin Roberts RN,  January 18, 2010 9:58 AM  Follow-up for Phone Call        Refill written by Dr. Denton Meek on 9/02. Follow-up by: Margarito Liner MD,  January 29, 2010 4:16 PM

## 2010-06-19 NOTE — Assessment & Plan Note (Signed)
Summary: pc2/lg   Primary Provider:  Jason Coop MD  CC:  pacer check.  History of Present Illness: The patient presents today for routine electrophysiology followup. He reports doing very well since last being seen in our clinic.  He reports having brief (<5 minutes) SSCP last week while washing his car.  He denies any other episodes of chest discomfort.  The patient denies symptoms of palpitations, shortness of breath, orthopnea, PND, lower extremity edema, dizziness, presyncope, syncope, or neurologic sequela. The patient is tolerating medications without difficulties and is otherwise without complaint today.   Current Medications (verified): 1)  Baby Aspirin 81 Mg Chew (Aspirin) .... Once Daily 2)  Percocet 10-650 Mg  Tabs (Oxycodone-Acetaminophen) .... Take One Tab By Mouth Once Every  6  Hours As Needed For Pain 3)  Warfarin Sodium 5 Mg  Tabs (Warfarin Sodium) .... Take As Directed. 4)  Multaq 400 Mg Tabs (Dronedarone Hcl) .... Take 1 Tablet By Mouth Bid  A Day 5)  Cardizem Cd 240 Mg Xr24h-Cap (Diltiazem Hcl Coated Beads) .... Take 1 Capsule By Mouth Two Times A Day 6)  Miralax  Powd (Polyethylene Glycol 3350) .... Once A Day 7)  Atenolol 25 Mg Tabs (Atenolol) .... Take 1 Pill By Mouth Daily. 8)  Omeprazole 40 Mg Cpdr (Omeprazole) .... Take 1 Tablet By Mouth Once A Day  Allergies (verified): 1)  ! Pcn  Past History:  Past Medical History: Reviewed history from 09/25/2008 and no changes required. ATRIAL FIBRILLATION     - s/p hospitalization for RVR: 6/07; 12/08; 2/09; 12/09, 3/10(cardioversion)    - s/p digitalis toxicity 1/09    - ablation aborted in 05/2008 b/c of bilateral femoral occlusion    - TEE followed by DC cardioversion Mar 2010:   ---  Left ventricular function at least moderately depressed.   ---  Aortic valve thickness was mildly increased. There was moderate  aortic valvular regurgitation.   ---  There was mild fixed atheroma of the descending aorta.  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   E coli bacteremia 07/2008- urinary infection and PNA Rx Cipro BILATERAL PLEURAL PLAQUES CLUSTER HEADACHES COPD    - s/p evaluation by Dr. Delford Field ([facility]) 12/2006: PFT's recommended but pt never f/u    - normal spirometry in 2005 MAJOR DEPRESSION    - s/p voluntary admission at [facility] 09/2002 ERECTILE DYSFUNCTION POLYSUBSTANCE ABUSE    - EtOH - no hx of DTs, no hx of inpt tx    - THC    - tobacco GASTRITIS, RECURRENT    - 2/2 EtOH    - hospitalized 08/2003, 02/2007 SICK SINUS SYNDROME    - s/p perm pacemaker VVI 02/2003 (Dr. 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 Johney Frame) and confirmed by ultrasound DEGENERATIVE DISC DISEASE    - C3-C7    - L3-L4, bilateral pars defect    - L5-S1 spondylolisthesis BILATERAL HIP AVASCULAR NECROSIS    -  etiology unknown - noted as far back as 2002    - s/p bilateral hip replacement (see PSH) HYPERTENSION OSTEOARTHRITIS   Left Hydrocele  Past Surgical History: Reviewed history from 09/09/2008 and no changes required. Single Chamber Permanent pacemaker-Oct 04 Left hip bipolar hemiarthroplasty-22Apr04 for AVN of Left femoral head- Dr.Phillips Montez Morita  had interosseous ganglion cyst in hip R total hip arthroplasty 02/2008 (Dr. Renae Fickle) for AVN of R femoral head Percutaneous transluminal coronary angiography-Oct 03-Nml Coronaries, Dr. Algie Coffer,     neg Persantine Myoview in Dec 06 s/p R  wrist cyst removal  Social History: Reviewed history from 08/15/2008 and no changes required. SUBSTANCE:     Current Smoker 1/2 ppd x approx 30 years    Alcohol use-yes...last drink 06/2008    Denies cocaine, crack    Marijuana use..helps with appetite and pain, 3-4x per week "as often as I can get it", last before admission 07/26/2008     SOCIAL:    Retired. 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 Very limited Asbestos exposure     10 children that he had with the same woman - she died from cancer in 2002-06-30    Recently lost his lease in [place]. Now renting a room in a house. (07/2008)     Lives alone. Is engaged.      Poor social support - no one he can ask for help with his meds, etc. (07/26/2008)  Review of Systems       All systems are reviewed and negative except as listed in the HPI.   Vital Signs:  Patient profile:   65 year old male Height:      72.5 inches Weight:      182 pounds BMI:     24.43 Pulse rate:   51 / minute Pulse rhythm:   regular BP sitting:   118 / 70  (left arm) Cuff size:   regular  Vitals Entered By: Judithe Modest CMA (August 31, 2009 4:04 PM)  Physical Exam  General:  alert, no distress Head:  normocephalic and atraumatic Eyes:  PERRLA/EOM intact; conjunctiva and lids normal. Mouth:  pharynx pink and moist.   Neck:  supple, no masses, and no thyromegaly.   Chest Wall:  pacemaker site is well healed Lungs:  right basilar rhonchi; otherwise clear Heart:  normal rate, regular rhythm, no murmur, and no gallop.   Abdomen:  soft, non-tender, normal bowel sounds, no hepatomegaly, and no splenomegaly.   Msk:  There is minimal pain on b/l pelvic compression. Similarly there is minimal tenderness on the low back.  Pulses:  pulses normal in all 4 extremities Extremities:  no edema Neurologic:  alert & oriented X3.     PPM  Specifications Following MD:  Hillis Range, MD     PPM Vendor:  [employer]     PPM Model Number:  [id number]     PPM Serial Number:  [id number] PPM DOI:  06/17/2008     PPM Implanting MD:  Hillis Range, MD  Lead 1    Location: RV     DOI: 06/17/2008     Model #: [id number]     Serial #: [id number]     Status: active  Magnet Response Rate:  BOL 98.6 ERI  86.3  Indications:  A-flutter/A-fib   PPM Follow Up Remote Check?  No Battery Voltage:  2.80 V  Battery Est. Longevity:  10 years     Pacer Dependent:  No     Right Ventricle  Amplitude: 5.5 mV, Impedance: 429 ohms, Threshold: 1.0 V at 0.5 msec  Episodes Coumadin:  Yes Ventricular Pacing:  10%  Parameters Mode:  VVI     Lower Rate Limit:  50     Next Cardiology Appt Due:  02/17/2010 Tech Comments:  No parameter changes.  Device function normal.  ROV 6 months clinic. Altha Harm, LPN  August 31, 2009 4:28 PM  MD Comments:  heart rate histograms reveal good rate control.  no significant afib or flutter.  Impression & Recommendations:  Problem # 1:  ATRIAL FIBRILLATION (ICD-427.31) doing very well on Multaq.  We will check LFTs upon return. No changes at this time continue coumadin  Problem # 2:  BRADYCARDIA (ICD-427.89) normal pacemaker function no changes  Problem # 3:  ENCOUNTER FOR LONG-TERM USE OF ANTICOAGULANTS (ICD-V58.61) will need lifelong anticoagulation with coumadin given hypercoagulability/ multiple venous occlusions, and afib  Problem # 4:  TOBACCO ABUSE (ICD-305.1) cessation advised  Patient Instructions: 1)  Your physician recommends that you schedule a follow-up appointment in: 6 months with Dr Johney Frame

## 2010-06-19 NOTE — Progress Notes (Signed)
Summary: med refill/gp  Phone Note Refill Request Message from:  Fax from Pharmacy on July 05, 2009 11:31 AM  Refills Requested: Medication #1:  ATENOLOL 25 MG TABS take 1 pill by mouth daily..   Last Refilled: 05/17/2009  Method Requested: Electronic Initial call taken by: Chinita Pester RN,  July 05, 2009 11:31 AM  Follow-up for Phone Call       Follow-up by: Jason Coop MD,  July 06, 2009 8:40 AM    Prescriptions: ATENOLOL 25 MG TABS (ATENOLOL) take 1 pill by mouth daily.  #30 x 4   Entered and Authorized by:   Jason Coop MD   Signed by:   Jason Coop MD on 07/06/2009   Method used:   Electronically to        [facility] [address] (retail)       [address] [place], [place]  [address]       Ph: [phone]       Fax: [phone]   RxID:   [id number]

## 2010-06-19 NOTE — Progress Notes (Signed)
Summary: Refill/gh  Phone Note Refill Request Message from:  Patient on March 20, 2010 2:28 PM  Refills Requested: Medication #1:  PERCOCET 10-650 MG  TABS take one tab by mouth once every  6  hours as needed for pain   Last Refilled: 02/15/2010  Method Requested: Pick up at Office Initial call taken by: Angelina Ok RN,  March 20, 2010 2:28 PM  Follow-up for Phone Call        I talked with pt and he was not aware he had a scheduled appointment today.  He will reschedule and keep that one. Follow-up by: Merrie Roof RN,  March 21, 2010 5:05 PM  Additional Follow-up for Phone Call Additional follow up Details #1::        Rx printed and signed - nurse to complete. Additional Follow-up by: Margarito Liner MD,  March 22, 2010 4:05 PM    Prescriptions: PERCOCET 10-650 MG  TABS (OXYCODONE-ACETAMINOPHEN) take one tab by mouth once every  6  hours as needed for pain  #120 x 0   Entered and Authorized by:   Margarito Liner MD   Signed by:   Margarito Liner MD on 03/22/2010   Method used:   Print then Give to Patient   RxID:   [id number]
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
## 2010-06-19 NOTE — Progress Notes (Signed)
Summary: Behavior  Phone Note Call from Patient Call back at Walk in to my office   Summary of Call: Administrative note Re: Behavior It appears pretty obvious to me that [name] did not understand the sensitivity of the discussion myself and Dr. Meredith Pel had with him about his behavior.  He came into the clinic today complaining (significantly) about the parking, then came to my office to complain (significantly) about being 30 minutes late (which is not the case based on his registration (which I observed).  He was rude and obnoxious.  He has now, in my view, overstepped his warning and the guidelines set for his continued use of this clinic. Initial call taken by: Raynaldo Opitz,  August 16, 2009 12:45 PM

## 2010-06-19 NOTE — Assessment & Plan Note (Signed)
Summary: COU/CH  Anticoagulant Therapy Managed by: Barbera Setters. Janie Morning  PharmD CACP PCP: Jason Coop MD [facility] AttendingCoralee Pesa MD, Levada Schilling Indication 1: Atrial fibrillation Indication 2: Encounter for therapeutic drug monitoring  V58.83 Usual Lab: [facility] Start date: 08/04/2008  Patient Assessment Reviewed by: Chancy Milroy PharmD  Sep 25, 2009 Medication review: verified warfarin dosage & schedule,verified previous prescription medications, verified doses & any changes, verified new medications, reviewed OTC medications, reviewed OTC health products-vitamins supplements etc Complications: none Dietary changes: none   Health status changes: none   Lifestyle changes: none   Recent/future hospitalizations: none   Recent/future procedures: none   Recent/future dental: none Patient Assessment Part 2:  Have you MISSED ANY DOSES or CHANGED TABLETS?  No missed Warfarin doses or changed tablets.  Have you had any BRUISING or BLEEDING ( nose or gum bleeds,blood in urine or stool)?  No reported bruising or bleeding in nose, gums, urine, stool.  Have you STARTED or STOPPED any MEDICATIONS, including OTC meds,herbals or supplements?  No other medications or herbal supplements were started or stopped.  Have you CHANGED your DIET, especially green vegetables,or ALCOHOL intake?  No changes in diet or alcohol intake.  Have you had any ILLNESSES or HOSPITALIZATIONS?  No reported illnesses or hospitalizations  Have you had any signs of CLOTTING?(chest discomfort,dizziness,shortness of breath,arms tingling,slurred speech,swelling or redness in leg)    No chest discomfort, dizziness, shortness of breath, tingling in arm, slurred speech, swelling, or redness in leg.     Treatment  Target INR: 2.0-3.0 INR: 2.0  Date: 09/25/2009 Regimen In:  57.5mg /week INR reflects regimen in: 2.0  New  Tablet strength: : 5mg  Regimen Out:     Sunday: 2 Tablet     Monday: 1 & 1/2 Tablet     Tuesday: 2  Tablet     Wednesday: 1 & 1/2 Tablet     Thursday: 2 Tablet      Friday: 1 & 1/2 Tablet     Saturday: 2 Tablet Total Weekly: 62.5mg /week mg  Next INR Due: 10/23/2009 Adjusted by: Barbera Setters. Alexandria Lodge III PharmD CACP   Return to anticoagulation clinic:  10/23/2009 Time of next visit: 1600    Allergies: 1)  ! Pcn

## 2010-06-19 NOTE — Progress Notes (Signed)
Summary: refill/ hla  Phone Note Refill Request Message from:  Patient on May 24, 2009 3:52 PM  Refills Requested: Medication #1:  PERCOCET 10-650 MG  TABS take one tab by mouth once every  6  hours as needed for pain   Last Refilled: 12/8 Initial call taken by: Marin Roberts RN,  May 24, 2009 3:52 PM  Follow-up for Phone Call       Follow-up by: Jason Coop MD,  May 24, 2009 7:09 PM    Prescriptions: PERCOCET 10-650 MG  TABS (OXYCODONE-ACETAMINOPHEN) take one tab by mouth once every  6  hours as needed for pain  #120 x 0   Entered and Authorized by:   Jason Coop MD   Signed by:   Jason Coop MD on 05/26/2009   Method used:   Handwritten   RxID:   [id number]   Appended Document: refill/ hla Dr Glennon Hamilton was dated 2010.  Rx was destroyed in front of [facility].  New Rx printed in my name and given to pt.   Prescriptions: PERCOCET 10-650 MG  TABS (OXYCODONE-ACETAMINOPHEN) take one tab by mouth once every  6  hours as needed for pain  #120 x 0   Entered and Authorized by:   Blanch Media MD   Signed by:   Blanch Media MD on 05/26/2009   Method used:   Print then Give to Patient   RxID:   [id number]

## 2010-06-19 NOTE — Initial Assessments (Signed)
INTERNAL MEDICINE ADMISSION HISTORY AND PHYSICAL  Attending: Dr. Doneen Poisson  First contact: AI Ms. Aileen Fass [phone] Second contact: Dr. Eben Burow [phone] [facility], after-hours: [phone], [phone])  PCP: Dr. Meredith Pel  NF:[id number], palpitation and chest pain  HPI: 65 year old man with past medical history of AFib with RVR with failed cardioversion, sick sinus syndrome status post pacemaker placement, COPD with coagulopathy with history of DVT in February 2010 presents with a chief complaint of  general weakness for two weeks.  The history was provided by the patient.  Pt had a similar attack one year ago. The onset of weakness is gradual and is associated with decreased appetite, palpitation and occasional dizziness which is worse on walking.  Pt denies any sick contacts or any upper respiratory infection recently.  He  claims that he also experiences  chest pain , 2 to 3 times a day for the last three days.  The pain which lasts 30 minutes is located below his left nipple. It is dull and pressure like and can be  relieved by Percocet.  The chest pain is associated wth shortness of breath, sweating and nausea. Pt stated that  he has been compliant with his own meds most of the time recently. Pt denies abdominal pain, vomit, chills and fever, cough, headache, numbness or vertigo recently. No recent trauma history. At ED, Pt  got  1250 cc NS bolus, was given Morphine 2g IV and on Cardizem at 10mg  IV loading dose, then 5mg  /hr drip.  [name]  ALLERGIES: ! PCN for rash  PAST MEDICAL HISTORY: ATRIAL FIBRILLATION     - s/p hospitalization for RVR: 6/07; 12/08; 2/09; 12/09, 3/10(cardioversion)    - s/p digitalis toxicity 1/09    - ablation aborted in 05/2008 b/c of bilateral femoral occlusion    - TEE followed by DC cardioversion Mar 2010:   ---  Left ventricular function at least moderately depressed.   ---  Aortic valve thickness was mildly increased. There was moderate  aortic valvular regurgitation.  ---  There was mild fixed atheroma of the descending aorta.  - Cath 2003: normal coronaries, EF 35%  - 2D Echo 02/2008: LVEF 55%, mild aortic regurg; TEE (05/2008) shows Left ventricular function at least moderately depressed.  CLOSTRIDIUM DIFFICILE COLITIS relapse Jun 05, 2008 treated with by mouth Vancomycin for 10 days C diff Apr 27, 2008 treated with Metronidazole for 10-14 days.   E coli bacteremia 07/2008- urinary infection and PNA Rx Cipro BILATERAL PLEURAL PLAQUES CLUSTER HEADACHES COPD    - s/p evaluation by Dr. Delford Field ([facility]) 12/2006: PFT's recommended but pt never f/u    - normal spirometry in 2005 MAJOR DEPRESSION    - s/p voluntary admission at [facility] 09/2002 ERECTILE DYSFUNCTION POLYSUBSTANCE ABUSE    - EtOH - no hx of DTs, no hx of inpt tx    - THC    - tobacco GASTRITIS, RECURRENT    - 2/2 EtOH    - hospitalized 08/2003, 02/2007 SICK SINUS SYNDROME    - s/p perm pacemaker VVI 02/2003 (Dr. 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 Johney Frame) and confirmed by ultrasound DEGENERATIVE DISC DISEASE    -  C3-C7    - L3-L4, bilateral pars defect    - L5-S1 spondylolisthesis BILATERAL HIP AVASCULAR NECROSIS    - etiology unknown - noted as far back as 2002    - s/p bilateral hip replacement (see PSH) HYPERTENSION OSTEOARTHRITIS   Left Hydrocele CKD: baseline Cr 1.8-2.   MEDICATIONS: BABY ASPIRIN 81 MG CHEW (ASPIRIN) once daily (stopped himhelf for 8-9 months for brusing) PERCOCET 10-650 MG  TABS (OXYCODONE-ACETAMINOPHEN) take one tab by mouth once every  6  hours as needed for pain WARFARIN SODIUM 5 MG  TABS (WARFARIN SODIUM) Take as directed. MULTAQ  400 MG TABS (DRONEDARONE HCL) Take 1 tablet by mouth two times a day CARDIZEM CD 240 MG XR24H-CAP (DILTIAZEM HCL COATED BEADS) Take 1 capsule by mouth two times a day MIRALAX  POWD (POLYETHYLENE GLYCOL 3350) once a day ATENOLOL 25 MG TABS (ATENOLOL) take 1 pill by mouth daily. OMEPRAZOLE 40 MG CPDR (OMEPRAZOLE) Take 1 tablet by mouth once a day SUMATRIPTAN SUCCINATE 50 MG TABS (SUMATRIPTAN SUCCINATE) Take one tablet by mouth once you feel your headache is approaching; may take the second tablet 2 hours later. CITALOPRAM HYDROBROMIDE 40 MG TABS (CITALOPRAM HYDROBROMIDE) Take 1 tablet by mouth once a day   SOCIAL HISTORY: SUBSTANCE:     Current Smoker 3/4 ppd x 30+ years, current less than one pack a day    Alcohol use-former heavy use - none since last year  Denies cocaine, crack  Admits marijuana use and Last abuse 1 month ago   Retired. 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 Very limited Asbestos exposure     10 children that he had with the same woman - she died from cancer in 06-16-02    Recently lost his lease in [place]. Now renting a room in a house. (07/2008)     Lives with one of his sons.     Poor social support - no one he can ask for help with his meds, etc. (07/26/2008)   FAMILY HISTORY Family History of Alcoholism/Addiction-Father now deceased Family History of Asthma-Mother deceased at age 54 from heart attack Mom had CAD Father passed of alcohol related problems in his 36s No colon cancer No lung cancer No prostate cancer   ROS: See HPI  VITALS: T: 97.5  P: 61>148 BP:105/75  R: 22 O2SAT: 99% ON:2L  PHYSICAL EXAM: GEN: NAD HEENT: PERRL, EOMI, no icterus, no pallor NECK: supple, no JVD, no cervical LN LUNGS: clear to auscultation bilaterally, no wheezing/crackles.  CVS: irregular irregular, normal S1 and S2,  no murmur/rubs/gallops ABD: Soft,  non-tender/distension, normal bowel sounds. EXTREMITIES: No edema or cyanosis NEURO: Alert &Oriented x3, no focal motor or sensory deficits.DTR symmetric    LABS: CBC+Diff WBC                                      7.5               4.0-10.5         K/uL  RBC                                      5.75  4.22-5.81        MIL/uL  Hemoglobin (HGB)                         18.2       h      13.0-17.0        g/dL  Hematocrit (HCT)                         52.6       h      39.0-52.0        %  MCV                                      91.5              78.0-100.0       fL  MCH -                                    31.7              26.0-34.0        pg  MCHC                                     34.6              30.0-36.0        g/dL  RDW                                      13.9              11.5-15.5        %  Platelet Count (PLT)                     181               150-400          K/uL  Neutrophils, %                           48                43-77            %  Lymphocytes, %                           36                12-46            %  Monocytes, %                             13         h      3-12             %  Eosinophils, %  3                 0-5              %  Basophils, %                             0                 0-1              %  Neutrophils, Absolute                    3.6               1.7-7.7          K/uL  Lymphocytes, Absolute                    2.7               0.7-4.0          K/uL  Monocytes, Absolute                      1.0               0.1-1.0          K/uL  Eosinophils, Absolute                    0.2               0.0-0.7          K/uL  Basophils, Absolute                      0.0               0.0-0.1          K/uL  WBC Morphology                           SEE NOTE.    ATYPICAL LYMPHOCYTES  RBC Morphology                           BURR CELLS  Smear Review                             SEE NOTE.    PLATELET CLUMPS NOTED ON  SMEAR, COUNT APPEARS ADEQUATE  Chem 8 I-Stat  TCO2                                     21                0-100            mmol/L  Ionized Calcium                          1.08       l      1.12-1.32        mmol/L  Hemoglobin (HGB)                         19.0  h      13.0-17.0        g/dL  Hematocrit (HCT)                         56.0       h      39.0-52.0        %  Sodium (NA)                              138               135-145          mEq/L  Potassium (K)                            4.6               3.5-5.1          mEq/L  Chloride                                 110               96-112           mEq/L  Glucose                                  125        h      70-99            mg/dL  BUN                                      32         h      6-23             mg/dL  Creatinine                               1.7        h      0.4-1.5          mg/dL   CKMB, POC                                <1.0       l      1.0-8.0          ng/mL  Troponin I, POC                          <0.05             0.00-0.09        ng/mL  Myoglobin, POC                           99.4              12-200           ng/mL   CMET  Sodium (NA)  138               135-145          mEq/L  Potassium (K)                            3.9               3.5-5.1          mEq/L  Chloride                                 106               96-112           mEq/L  CO2                                      20                19-32            mEq/L  Glucose                                  122        h      70-99            mg/dL  BUN                                      23                6-23             mg/dL  Creatinine                               1.85       h      0.4-1.5          mg/dL  GFR, Est Non African American            37         l      >60              mL/min  GFR, Est African American                44         l      >60              mL/min    Oversized comment, see footnote  1   Bilirubin, Total                         0.9               0.3-1.2          mg/dL  Alkaline Phosphatase                     63                39-117  U/L  SGOT (AST)                               22                0-37             U/L  SGPT (ALT)                               8                 0-53             U/L  Total  Protein                           8.3               6.0-8.3          g/dL  Albumin-Blood                            3.8               3.5-5.2          g/dL  Calcium                                  9.6               8.4-10.5         mg/dL  Lipase                                   27                11-59            U/L   PORTABLE CHEST - 1 VIEW    Comparison: 08/16/2009    Findings: Heart size within normal limits for AP projection.   Permanent cardiac pacer unchanged.  Right lung clear with probable   nipple shadow.  Chronic left pleural and parenchymal scarring   without definite active disease.    IMPRESSION:   Chronic changes as above.  No active disease in one-view.    Read By:  Bernerd Limbo,  M.D.   Released By:  Bernerd Limbo,  M.D.    ASSESSMENT AND PLAN:  (1) Weakness: Most likely related to dehydration and uncontrolled Aflutter with RVR which is supported by palpitation and dizziness. His heart rate was very irregular and 148 bpm at admission.  We will admit to SDU, continue to keep him on Warfarin, will check PT/PTT/INR. We also  put him on Caridazem IV gttt @ 10mg  /hr and titrate to keep HR 80 to 120 and SPB >90. Will continue Multaq 400mg  by mouth two times a day and Atenolol 25mg  by mouth daily. Pt hasn't been eating or drinking very well and will gentle rehydration. Now on NS 75 cc/hr, keep stricty I and Os and check orthostatic vitals.  (2 )Chest pain: Concerning for ACS. But GI cause such as GERd or gastritis may also contribute to this. We ordered EKG and cardiac enzyme to rule out  MI. Will order 2D echo, TSH, Free T4, FLP. Morphine and  Nitroglycerine for pain relief as well as BB.Continue omeprazole.  (3) Aflutter with RVR: See problem #1. INR 2.3 in 01/19/2010.  (4) Chronic renal insufficiency: Now Cr at his baseline and will monitor Cr and will gently rehydrate him.  Will follow up his renal function.  (5)DEPRESSION/ANXIETY: Contine his home meds Celexa  (6) HTN: Now patient has soft BP. Will closely monitor BP with rehydration. Will keep SBP >90, if still soft, may decrease cardiazem/atenolol.    (7) PSA: Will check UDS. Provided smoking cessation and nicootine patch.   (8)VTE PROPH: on Coumadin

## 2010-06-19 NOTE — Assessment & Plan Note (Signed)
Summary: ACUTE-HA'S OKAY TO ADD PER DORIS/MAMIE/CFB(JOINES)   Vital Signs:  Patient profile:   65 year old male Height:      72.5 inches (184.15 cm) Weight:      171.5 pounds (77.95 kg) BMI:     23.02 Temp:     97.5 degrees F (36.39 degrees C) oral Pulse rate:   50 / minute BP sitting:   104 / 70  (left arm) Cuff size:   regular  Vitals Entered By: Theotis Barrio NT II (January 19, 2010 11:41 AM) CC: HEADACHE # 10 -LASTING FOR A COUPLE DAYS  /  REFILL ON PAIN MED (DUE 5TH ) , Depression Is Patient Diabetic? No Pain Assessment Patient in pain? yes     Location: HEADACHE Intensity:  10 Onset of pain  FOR A COUPLE DAYS  Have you ever been in a relationship where you felt threatened, hurt or afraid?No   Does patient need assistance? Functional Status Self care Ambulation Normal   Primary Care Andoni Busch:  Margarito Liner MD  CC:  HEADACHE # 10 -LASTING FOR A COUPLE DAYS  /  REFILL ON PAIN MED (DUE 5TH )  and Depression.  History of Present Illness: HA for 3 days;unilateral, occipital, throbbing Hx of similar headaches in the past. Never saw  a neurologist in the past. pain is 6-7/10 in intensity; worse with bright light and noise; better with rest. Lasts for 3-4 hours at a time. REleived with percocet. Used Imitrex in the past --wich was successfull in aborting his headaches. He denies any dizziness, visual or speech deficits, no weakness.. No fever, chills or any other Sx.  Depression History:      The patient presents with symptoms of depression which have been present for less than two weeks.  The patient is having a depressed mood most of the day.  Positive alarm features for depression include significant weight loss, insomnia, fatigue (loss of energy), and feelings of worthlessness (guilt).  The patient denies symptoms of a manic disorder including persistently & abnormally elevated mood, abnormally & persistently irritable mood, less need for sleep, talkative or feels need to  keep talking, distractibility, flight of ideas, increase in goal-directed activity, psychomotor agitation, inflated self-esteem or grandiosity, excessive buying sprees, excessive sexual indiscretions, and excessive foolish business investments.        Psychosocial stress factors include a recent marital separation.  The patient denies that he feels like life is not worth living, denies that he wishes that he were dead, and denies that he has thought about ending his life.         Preventive Screening-Counseling & Management  Alcohol-Tobacco     Alcohol drinks/day: 4+     Smoking Status: current     Smoking Cessation Counseling: yes     Packs/Day: 0.75     Year Started: 1959     Passive Smoke Exposure: yes  Caffeine-Diet-Exercise     Does Patient Exercise: yes     Type of exercise: WALKING     Times/week: AT TIMES  Current Problems (verified): 1)  Need Prophylactic Vaccination&inoculation Flu  (ICD-V04.81) 2)  Renal Insufficiency  (ICD-588.9) 3)  Weight Loss  (ICD-783.21) 4)  Bradycardia  (ICD-427.89) 5)  Hyperthyroidism  (ICD-242.90) 6)  Intestinal Obstruction, Hx of  (ICD-V12.79) 7)  Lung Nodule  (ICD-518.89) 8)  Hx of Pneumonia  (ICD-486) 9)  Hypokalemia  (ICD-276.8) 10)  Atrial Fibrillation  (ICD-427.31) 11)  Diarrhea  (ICD-787.91) 12)  Personal History Other Disorder Urinary  System  (ICD-V13.09) 13)  Other Primary Cardiomyopathies  (ICD-425.4) 14)  Weakness  (ICD-780.79) 15)  Encounter For Long-term Use of Anticoagulants  (ICD-V58.61) 16)  Leukocytosis Unspecified  (ICD-288.60) 17)  Health Screening  (ICD-V70.0) 18)  Atrial Fibrillation With Rapid Ventricular Response  (ICD-427.31) 19)  Edema  (ICD-782.3) 20)  Other Chronic Pain  (ICD-338.29) 21)  Intermittent Claudication  (ICD-443.9) 22)  G E R D  (ICD-530.81) 23)  Osteoarthritis  (ICD-715.90) 24)  Hydrocele Nos  (ICD-603.9) 25)  Thrombocytopenia  (ICD-287.5) 26)  Emphysema  (ICD-492.8) 27)  Myalgia   (ICD-729.1) 28)  Scrotal Mass  (ICD-608.89) 29)  Disorder, Bipolar Nos  (ICD-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 Once Daily 2)  Percocet 10-650 Mg  Tabs (Oxycodone-Acetaminophen) .... Take One Tab By Mouth Once Every  6  Hours As Needed For Pain 3)  Warfarin Sodium 5 Mg  Tabs (Warfarin Sodium) .... Take As Directed. 4)  Multaq 400 Mg Tabs (Dronedarone Hcl) .... Take 1 Tablet By Mouth Two Times A Day 5)  Cardizem Cd 240 Mg Xr24h-Cap (Diltiazem Hcl Coated Beads) .... Take 1 Capsule By Mouth Two Times A Day 6)  Miralax  Powd (Polyethylene Glycol 3350) .... Once A Day 7)  Atenolol 25 Mg Tabs (Atenolol) .... Take 1 Pill By Mouth Daily. 8)  Omeprazole 40 Mg Cpdr (Omeprazole) .... Take 1 Tablet By Mouth Once A Day  Allergies (verified): 1)  ! Pcn  Past History:  Past Medical History: Last updated: 09/25/2008 ATRIAL FIBRILLATION     - s/p hospitalization for RVR: 6/07; 12/08; 2/09; 12/09, 3/10(cardioversion)    - s/p digitalis toxicity 1/09    - ablation aborted in 05/2008 b/c of bilateral femoral occlusion    - TEE followed by DC cardioversion Mar 2010:   ---  Left ventricular function at least moderately depressed.   ---  Aortic valve thickness was mildly increased. There was moderate  aortic valvular regurgitation.   ---  There was mild fixed atheroma of the descending aorta.  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   E coli bacteremia 07/2008- urinary infection and PNA Rx Cipro BILATERAL PLEURAL PLAQUES CLUSTER HEADACHES COPD    - s/p evaluation by Dr. Delford Field ([facility]) 12/2006: PFT's recommended but pt never f/u    - normal spirometry in 2005 MAJOR DEPRESSION    - s/p voluntary admission at [facility] 09/2002 ERECTILE DYSFUNCTION POLYSUBSTANCE ABUSE    - EtOH - no hx of DTs, no hx of inpt tx    - THC    - tobacco GASTRITIS, RECURRENT    - 2/2 EtOH    - hospitalized 08/2003, 02/2007 SICK SINUS SYNDROME    - s/p perm pacemaker VVI 02/2003 (Dr. 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 Johney Frame) and confirmed by ultrasound DEGENERATIVE DISC DISEASE    - C3-C7    - L3-L4, bilateral pars defect    - L5-S1 spondylolisthesis BILATERAL HIP AVASCULAR NECROSIS    - etiology unknown - noted as far back as 2002    - s/p bilateral hip replacement (see PSH) HYPERTENSION OSTEOARTHRITIS   Left Hydrocele  Past Surgical History: Last updated: 09/09/2008 Single Chamber Permanent pacemaker-Oct 04 Left hip bipolar hemiarthroplasty-22Apr04 for AVN of Left femoral head- Dr.Phillips Montez Morita  had interosseous ganglion cyst in hip R total hip arthroplasty 02/2008 (Dr. Renae Fickle) for AVN of R femoral head Percutaneous transluminal coronary angiography-Oct 03-Nml Coronaries, Dr. Algie Coffer,     neg Persantine Myoview in Dec 06 s/p R wrist cyst removal  Family History: Last updated: 11/01/2009 Family History of Alcoholism/Addiction-Father now deceased Family History of Asthma-Mother deceased at age 4 Mom had  CAD Father passed of alcohol related problems in his 65s No colon cancer No lung cancer No prostate cancer  Social History: Last updated: 11/01/2009 SUBSTANCE:     Current Smoker 3/4 ppd x 30+ years    Alcohol use-former heavy use - none since last year  Denies cocaine, crack  Denies current marijuana use.  SOCIAL:    Retired. 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 Very limited Asbestos exposure     10 children that he had with the same woman - she died from cancer in Jun 14, 2002    Recently lost his lease in [place]. Now renting a room in a house. (07/2008)     Lives with one of his sons.     Poor social support - no one he can ask for help with his meds, etc. (07/26/2008)  Risk Factors: Alcohol Use: 4+ (01/19/2010) Exercise: yes (01/19/2010)  Risk Factors: Smoking Status: current (01/19/2010) Packs/Day: 0.75 (01/19/2010) Passive Smoke Exposure: yes (01/19/2010)  Review of Systems  The patient denies fever, weight gain, vision loss, decreased hearing, hoarseness, chest pain, syncope, dyspnea on exertion, peripheral edema, and abdominal pain.   General:  Denies chills, fatigue, fever, loss of appetite, malaise, sleep disorder, sweats, weakness, and weight loss. Eyes:  Denies blurring, discharge, double vision, eye irritation, eye pain, halos, itching, light sensitivity, red eye, vision loss-1 eye, and vision loss-both eyes.  Physical Exam  General:  alert, no acute distress Head:  Normocephalic and atraumatic without obvious abnormalities. No apparent alopecia or balding. Eyes:  No corneal or conjunctival inflammation noted. EOMI. Perrla. Funduscopic exam benign, without hemorrhages, exudates or papilledema. Vision grossly normal. Nose:  External nasal examination shows no deformity or inflammation. Nasal mucosa are pink and moist without lesions or  exudates. Mouth:  pharynx pink and moist.   Neck:  supple, no masses, and no thyromegaly.   Lungs:  decreased breath sounds left base; few bibasilar ralesnormal respiratory effort and normal breath sounds.   Heart:  normal rate, regular rhythm, no murmur, no gallop, and no rub.   Abdomen:  soft; mild epigastric tenderness; no hepatosplenomegaly Msk:  There is minimal pain on b/l pelvic compression. Similarly there is minimal tenderness on the low back. Walks with  a cane sp bilatearl hip replacement. Pulses:  R radial normal.   Extremities:  no edema Neurologic:  No cranial nerve deficits noted. Station and gait are normal. Plantar reflexes are down-going bilaterally. DTRs are symmetrical throughout. Sensory, motor and coordinative functions appear intact. Skin:  Intact without suspicious lesions or rashes Cervical Nodes:  No lymphadenopathy noted Psych:  Oriented X3, memory intact for recent and remote, normally interactive, not suicidal, not homicidal, and depressed affect.     Impression & Recommendations:  Problem # 1:  HEADACHE (ICD-784.0) Assessment New  Consistent with a Dx of common migraine. Will restart Sumatriptan. Instructed to avoid Percocent use due to high reisk of sedation, addiction and constipation. Instructed to call 911 or go to ED if develops intractable HA, dizziness, or there is a weakness. Patient verbalized understanding. His updated medication list for this problem includes:    Baby Aspirin 81 Mg Chew (Aspirin) ..... Once daily    Percocet 10-650 Mg Tabs (Oxycodone-acetaminophen) .[name]... Take one tab by mouth once every  6  hours as needed for pain    Atenolol 25 Mg Tabs (Atenolol) .[name]... Take 1 pill by mouth daily.    Sumatriptan Succinate 50 Mg Tabs (Sumatriptan succinate) .[name]... Take one tablet by mouth once you feel your headache is approaching; may take the second tablet 2 hours later.  Headache diary reviewed.  Problem # 2:  DEPRESSION, SITUATIONAL, ACUTE  (ICD-300.4) Assessment: Deteriorated Will restart Celexa. Instructed to call 911 or got to ED if develops SI/HI or mania. Will follow up in 2 weeks. will check TSH to evaluate for an organic etiology.  Problem # 3:  ATRIAL FIBRILLATION (ICD-427.31) Assessment: Comment Only Patient denies any CP, dizziness, SOB; bleeding gums or excessive ecchymosis. His updated medication list for this problem includes:    Baby Aspirin 81 Mg Chew (Aspirin) ..... Once daily    Warfarin Sodium 5 Mg Tabs (Warfarin sodium) .[name]... Take as directed.    Multaq 400 Mg Tabs (Dronedarone hcl) .[name]... Take 1 tablet by mouth two times a day    Cardizem Cd 240 Mg Xr24h-cap (Diltiazem hcl coated beads) .[name]... Take 1 capsule by mouth two times a day    Atenolol 25 Mg Tabs (Atenolol) .[name]... Take 1 pill by mouth daily.  Orders: T-Protime (in-house) [phone]) T-TSH [phone])  Reviewed the following: PT: 13.7 (08/16/2009)   INR: 2.3 (01/19/2010) Coumadin Dose (weekly): 65.0mg /week (11/27/2009) Prior Coumadin Dose (weekly): 65.0mg /week (11/27/2009) Next Protime: 12/18/2009 (dated on 11/27/2009)  Complete Medication List: 1)  Baby Aspirin 81 Mg Chew (Aspirin) .... Once daily 2)  Percocet 10-650 Mg Tabs (Oxycodone-acetaminophen) .... Take one tab by mouth once every  6  hours as needed for pain 3)  Warfarin Sodium 5 Mg Tabs (Warfarin sodium) .... Take as directed. 4)  Multaq 400 Mg Tabs (Dronedarone hcl) .... Take 1 tablet by mouth two times a day 5)  Cardizem Cd 240 Mg Xr24h-cap (Diltiazem hcl coated beads) .... Take 1 capsule by mouth two times a day 6)  Miralax Powd (Polyethylene glycol 3350) .... Once a day 7)  Atenolol 25 Mg Tabs (Atenolol) .... Take 1 pill by mouth daily. 8)  Omeprazole 40 Mg Cpdr (Omeprazole) .... Take 1 tablet by mouth once a day 9)  Sumatriptan Succinate 50 Mg Tabs (Sumatriptan succinate) .... Take one tablet by mouth once you feel your headache is approaching; may take the second tablet 2 hours  later. 10)  Citalopram Hydrobromide 40 Mg Tabs (Citalopram hydrobromide) .... Take 1 tablet  by mouth once a day  Other Orders: Influenza Vaccine MCR [id number]) Admin 1st Vaccine (78295) Flu Vaccine 49yrs + (62130)  Patient Instructions: 1)  Please make an appointment with Dr, Alexandria Lodge [facility] ASAP. 2)  Please, pick up your prescription for IMITREX (for  headache) and Celexa (for depression)  at [facility]. 3)  Please, do not drive and/or operate machinery when taking percocet, feeling dizzy or sleepy. 4)  Go to ER or call 911 if your Headaches worsen. Prescriptions: PERCOCET 10-650 MG  TABS (OXYCODONE-ACETAMINOPHEN) take one tab by mouth once every  6  hours as needed for pain  #120 x 0   Entered and Authorized by:   Deatra Robinson MD   Signed by:   Deatra Robinson MD on 01/19/2010   Method used:   Print then Give to Patient   RxID:   [id number] CITALOPRAM HYDROBROMIDE 40 MG TABS (CITALOPRAM HYDROBROMIDE) Take 1 tablet by mouth once a day  #30 x 11   Entered and Authorized by:   Deatra Robinson MD   Signed by:   Deatra Robinson MD on 01/19/2010   Method used:   Electronically to        [facility] [phone]* (retail)       [address]       [place], [place]  [address]       Ph: [phone] or [phone]       Fax: [phone]   RxID:   [id number]   Handout requested. SUMATRIPTAN SUCCINATE 50 MG TABS (SUMATRIPTAN SUCCINATE) Take one tablet by mouth once you feel your headache is approaching; may take the second tablet 2 hours later.  #7 x 11   Entered and Authorized by:   Deatra Robinson MD   Signed by:   Deatra Robinson MD on 01/19/2010   Method used:   [facility]        [facility] [address] [phone]* (retail)       [address]       [place], [place]  [address]       Ph: [phone] or [phone]       Fax: [phone]   RxID:   [id number]   Handout requested.  Process Orders Check  Orders Results:     [facility]: Check successful Tests Sent for requisitioning (January 23, 2010 6:20 PM):     01/19/2010: [facility] -- T-TSH [phone] (signed)     Prevention & Chronic Care Immunizations   Influenza vaccine: Fluvax 3+  (01/19/2010)    Tetanus booster: Not documented    Pneumococcal vaccine: Not documented    H. zoster vaccine: Not documented  Colorectal Screening   Hemoccult: Not documented   Hemoccult action/deferral: Ordered  (01/03/2010)    Colonoscopy: Not documented   Colonoscopy action/deferral: GI referral  (01/03/2010)  Other Screening   PSA: 0.36  (08/16/2009)   PSA action/deferral: Discussed-PSA requested  (08/16/2009)   Smoking status: current  (01/19/2010)   Smoking cessation counseling: yes  (01/19/2010)  Lipids   Total Cholesterol: 122  (07/06/2009)   LDL: 57  (07/06/2009)   LDL Direct: Not documented   HDL: 58  (07/06/2009)   Triglycerides: 36  (07/06/2009)  Hypertension   Last Blood Pressure: 104 / 70  (01/19/2010)   Serum creatinine: 1.96  (01/04/2010)   Serum potassium 5.4  (01/04/2010)  Self-Management Support :  Personal Goals (by the next clinic visit) :      Personal blood pressure goal: 140/90  (01/19/2010)   Patient will work on the following items until the next clinic visit to reach self-care goals:     Medications and monitoring: take my medicines every day  (01/19/2010)     Eating: drink diet soda or water instead of juice or soda, eat more vegetables, use fresh or frozen vegetables, eat foods that are low in salt, eat baked foods instead of fried foods, eat fruit for snacks and desserts, limit or avoid alcohol  (01/19/2010)     Other: see a smoking cessation counselor  (11/01/2009)    Hypertension self-management support: Resources for patients handout  (01/19/2010)      Resource handout printed.    Immunizations Administered:  Influenza Vaccine # 1:    Vaccine Type:  Fluvax MCR    Site: right deltoid    Mfr: GlaxoSmithKline    Dose: 0.5 ml    Route: IM    Given by: Angelina Ok RN    Exp. Date: 11/17/2010    Lot #: [id number]    VIS given: 12/12/09 version given January 19, 2010.  Flu Vaccine Consent Questions:    Do you have a history of severe allergic reactions to this vaccine? no    Any prior history of allergic reactions to egg and/or gelatin? no    Do you have a sensitivity to the preservative Thimersol? no    Do you have a past history of Guillan-Barre Syndrome? no    Do you currently have an acute febrile illness? no    Have you ever had a severe reaction to latex? no    Vaccine information given and explained to patient? yes Flu Vaccine Consent Questions     Do you have a history of severe allergic reactions to this vaccine? no    Any prior history of allergic reactions to egg and/or gelatin? no    Do you have a sensitivity to the preservative Thimersol? no    Do you have a past history of Guillan-Barre Syndrome? no    Do you currently have an acute febrile illness? no    Have you ever had a severe reaction to latex? no    Vaccine information given and explained to patient? yes    Are you currently pregnant? no    Lot Number:[id number]   Exp Date:11/17/2010   Manufacturer: [employer]    Site Given  Left Deltoid IM    Do you have a sensitivity to the preservative Thimersol? no    Do you have a past history of Guillan-Barre Syndrome? no    Do you currently have an acute febrile illness? no    Have you ever had a severe reaction to latex? no    Vaccine information given and explained to patient? yes .opcflu  Laboratory Results   Blood Tests   Date/Time Received: January 19, 2010 12:40 PM  Date/Time Reported: Alric Quan  January 19, 2010 12:40 PM    INR: 2.3   (Normal Range: 0.88-1.12   Therap INR: 2.0-3.5)

## 2010-06-19 NOTE — Cardiovascular Report (Signed)
Summary: Office Visit   Office Visit   Imported By: Roderic Ovens 09/05/2009 15:45:43  _____________________________________________________________________  External Attachment:    Type:   Image     Comment:   External Document

## 2010-06-19 NOTE — Progress Notes (Signed)
Summary: refill/gg  Phone Note Refill Request  on Sep 21, 2009 4:58 PM  Refills Requested: Medication #1:  PERCOCET 10-650 MG  TABS take one tab by mouth once every  6  hours as needed for pain   Last Refilled: 08/24/2009 call when ready @ [phone]   Method Requested: Pick up at Office Initial call taken by: Merrie Roof RN,  Sep 21, 2009 4:58 PM  Follow-up for Phone Call        Refill printed and signed - nurse to complete. Follow-up by: Margarito Liner MD,  Sep 22, 2009 3:10 PM  Additional Follow-up for Phone Call Additional follow up Details #1::        Rx ready for pick up- pt. called and message left. Additional Follow-up by: Chinita Pester RN,  Sep 22, 2009 3:29 PM    Prescriptions: PERCOCET 10-650 MG  TABS (OXYCODONE-ACETAMINOPHEN) take one tab by mouth once every  6  hours as needed for pain  #120 x 0   Entered and Authorized by:   Margarito Liner MD   Signed by:   Margarito Liner MD on 09/22/2009   Method used:   Print then Give to Patient   RxID:   [id number]

## 2010-06-19 NOTE — Assessment & Plan Note (Signed)
Summary: EST-CK/FU/MEDS/CFB   Vital Signs:  Patient profile:   65 year old male Height:      72.5 inches Weight:      181.8 pounds BMI:     24.41 Temp:     97.3 degrees F oral Pulse rate:   49 / minute BP sitting:   100 / 64  (right arm)  Vitals Entered By: Filomena Jungling NT II (November 01, 2009 9:12 AM) CC: check-up Is Patient Diabetic? No Pain Assessment Patient in pain? no      Nutritional Status BMI of 19 -24 = normal  Have you ever been in a relationship where you felt threatened, hurt or afraid?No   Does patient need assistance? Functional Status Self care Ambulation Normal, Impaired:Risk for fall Comments walks with cane   Primary Care Provider:  Margarito Liner MD  CC:  check-up.  History of Present Illness: Patient returns for followup of his osteoarthritis with chronic pain, atrial fibrillation, hyperthyroidism, and other chronic medical problems. Today he has no acute complaints. He reports chronic hip and back pain, and says it is hard to straighten up in the a.m. when he first gets up. The Percocet is controlling his pain; he has some constipation but takes Colace. He reports that he is compliant with his medications. He follows up in the anti-coagulation clinic for management of his warfarin. He has had no recent palpitations.  Preventive Screening-Counseling & Management  Alcohol-Tobacco     Smoking Status: current     Smoking Cessation Counseling: yes     Packs/Day: 0.75     Year Started: 1959     Passive Smoke Exposure: yes  Caffeine-Diet-Exercise     Does Patient Exercise: yes     Type of exercise: WALKING     Times/week: AT TIMES  Current Medications (verified): 1)  Baby Aspirin 81 Mg Chew (Aspirin) .... Once Daily 2)  Percocet 10-650 Mg  Tabs (Oxycodone-Acetaminophen) .... Take One Tab By Mouth Once Every  6  Hours As Needed For Pain 3)  Warfarin Sodium 5 Mg  Tabs (Warfarin Sodium) .... Take As Directed. 4)  Multaq 400 Mg Tabs (Dronedarone Hcl) ....  Take 1 Tablet By Mouth Bid  A Day 5)  Cardizem Cd 240 Mg Xr24h-Cap (Diltiazem Hcl Coated Beads) .... Take 1 Capsule By Mouth Two Times A Day 6)  Miralax  Powd (Polyethylene Glycol 3350) .... Once A Day 7)  Atenolol 25 Mg Tabs (Atenolol) .... Take 1 Pill By Mouth Daily. 8)  Omeprazole 40 Mg Cpdr (Omeprazole) .... Take 1 Tablet By Mouth Once A Day  Allergies (verified): 1)  ! Pcn  Family History: Family History of Alcoholism/Addiction-Father now deceased Family History of Asthma-Mother deceased at age 43 Mom had CAD Father passed of alcohol related problems in his 61s No colon cancer No lung cancer No prostate cancer  Social History: SUBSTANCE:     Current Smoker 3/4 ppd x 30+ years    Alcohol use-former heavy use - none since last year  Denies cocaine, crack  Denies current marijuana use.  SOCIAL:    Retired. 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 Very limited Asbestos exposure     10 children that he had with the same woman - she died from cancer in 06-01-2002    Recently lost his lease in [place]. Now renting a room in a house. (07/2008)  Lives with one of his sons.     Poor social support - no one he can ask for help with his meds, etc. (07/26/2008)Packs/Day:  0.75  Review of Systems General:  Denies chills, fever, loss of appetite, sweats, and weight loss. CV:  Denies chest pain or discomfort, difficulty breathing at night, difficulty breathing while lying down, fainting, and palpitations; reports occasional brief episodes of exertional dyspnea, perhaps once a month, relieved by rest. Resp:  Denies chest discomfort, cough, and shortness of breath. GI:  Denies abdominal pain, bloody stools, dark tarry stools, nausea, and vomiting. GU:  Denies dysuria, urinary frequency, and urinary hesitancy. MS:  occasional leg cramps.  Physical Exam  General:  alert, no  distress Lungs:  normal respiratory effort, normal breath sounds, no crackles, and no wheezes.   Heart:  normal rate, regular rhythm, no murmur, no gallop, and no rub.   Abdomen:  soft, non-tender, normal bowel sounds, no hepatomegaly, and no splenomegaly.   Extremities:  no edema   Impression & Recommendations:  Problem # 1:  ATRIAL FIBRILLATION (ICD-427.31) Patient is doing well on current regimen, and is followed in cardiology by Dr. Johney Frame. He is status post pacemaker placement. He will continue current medications and followup with his cardiologist as scheduled.  He will also followup in the anticoagulation clinic here.  His updated medication list for this problem includes:    Baby Aspirin 81 Mg Chew (Aspirin) ..... Once daily    Warfarin Sodium 5 Mg Tabs (Warfarin sodium) .[name]... Take as directed.    Multaq 400 Mg Tabs (Dronedarone hcl) .[name]... Take 1 tablet by mouth bid  a day    Cardizem Cd 240 Mg Xr24h-cap (Diltiazem hcl coated beads) .[name]... Take 1 capsule by mouth two times a day    Atenolol 25 Mg Tabs (Atenolol) .[name]... Take 1 pill by mouth daily.  Problem # 2:  OSTEOARTHRITIS (ICD-715.90) Patient has chronic back pain and hip pain which is controlled on his current dose of Percocet. Plan is to continue as before.  His updated medication list for this problem includes:    Baby Aspirin 81 Mg Chew (Aspirin) ..... Once daily    Percocet 10-650 Mg Tabs (Oxycodone-acetaminophen) .[name]... Take one tab by mouth once every  6  hours as needed for pain  Problem # 3:  G E R D (ICD-530.81) Patient's symptoms are well controlled on current regimen.  His updated medication list for this problem includes:    Omeprazole 40 Mg Cpdr (Omeprazole) .[name]... Take 1 tablet by mouth once a day  Labs Reviewed: Hgb: 15.3 (08/16/2009)   Hct: 46.8 (08/16/2009)  Problem # 4:  HYPERTHYROIDISM (ICD-242.90) Since his last visit here, patient saw Dr. Sharl Ma for endocrinology evaluation. Dr. Sharl Ma concurred that  patient likely had an episode of transient thyroiditis that has now resolved. Amiodarone may have been a contributing factor, but he has been off of that medication since last summer. Will recheck a TSH and free T4 today.  His updated medication list for this problem includes:    Atenolol 25 Mg Tabs (Atenolol) .[name]... Take 1 pill by mouth daily.  Labs Reviewed: TSH: 1.380 (08/16/2009)     Orders: T-TSH ([id number]) T-T4, Free ([id number])  Problem # 5:  TOBACCO ABUSE (ICD-305.1) I discussed smoking cessation with patient, and he is willing to see our smoking cessation counselor. I advised him to schedule an appointment with her.  Problem # 6:  WEIGHT LOSS (ICD-783.21) Patient's weight is stable over the last 4  months. He reports that his appetite is good. His chest x-ray showed no evidence of neoplasm. He reports that he has had a colonoscopy in the past, and the plan is to try to obtain a copy of that report. Will recheck weight upon return.  Problem # 7:  HYPERTENSION (ICD-401.9) Patient's blood pressure is well controlled on current regimen.  Plan is to continue current antihypertensive medications.   His updated medication list for this problem includes:    Cardizem Cd 240 Mg Xr24h-cap (Diltiazem hcl coated beads) .[name]... Take 1 capsule by mouth two times a day    Atenolol 25 Mg Tabs (Atenolol) .[name]... Take 1 pill by mouth daily.  Orders: T-Basic Metabolic Panel [phone])  BP today: 100/64 Prior BP: 118/70 (08/31/2009)  Prior 10 Yr Risk Heart Disease: 18 % (09/13/2008)  Labs Reviewed: K+: 3.7 (07/06/2009) Creat: : 1.27 (07/06/2009)   Chol: 122 (07/06/2009)   HDL: 58 (07/06/2009)   LDL: 57 (07/06/2009)   TG: 36 (07/06/2009)  Complete Medication List: 1)  Baby Aspirin 81 Mg Chew (Aspirin) .... Once daily 2)  Percocet 10-650 Mg Tabs (Oxycodone-acetaminophen) .... Take one tab by mouth once every  6  hours as needed for pain 3)  Warfarin Sodium 5 Mg Tabs (Warfarin sodium) ....  Take as directed. 4)  Multaq 400 Mg Tabs (Dronedarone hcl) .... Take 1 tablet by mouth bid  a day 5)  Cardizem Cd 240 Mg Xr24h-cap (Diltiazem hcl coated beads) .... Take 1 capsule by mouth two times a day 6)  Miralax Powd (Polyethylene glycol 3350) .... Once a day 7)  Atenolol 25 Mg Tabs (Atenolol) .... Take 1 pill by mouth daily. 8)  Omeprazole 40 Mg Cpdr (Omeprazole) .... Take 1 tablet by mouth once a day  Other Orders: T-Hemoccult Card-Multiple (take home) ([id number])  Patient Instructions: 1)  Please schedule a follow-up appointment in 3 months. 2)  Please schedule an appointment with our smoking cessation counselor. 3)  Please complete and return the hemoccult cards as instructed.  Prevention & Chronic Care Immunizations   Influenza vaccine: Fluvax 3+  (07/06/2009)    Tetanus booster: Not documented    Pneumococcal vaccine: Not documented    H. zoster vaccine: Not documented    Immunization comments: Thinks he had a tetanus booster and Pneumovax  Colorectal Screening   Hemoccult: Not documented   Hemoccult action/deferral: Ordered  (11/01/2009)    Colonoscopy: Not documented  Other Screening   PSA: 0.36  (08/16/2009)   PSA action/deferral: Discussed-PSA requested  (08/16/2009)  Reports requested:   Last colonoscopy report requested.  Smoking status: current  (11/01/2009)   Smoking cessation counseling: yes  (11/01/2009)    Screening comments: Reports that he has had a colonoscopy  Lipids   Total Cholesterol: 122  (07/06/2009)   LDL: 57  (07/06/2009)   LDL Direct: Not documented   HDL: 58  (07/06/2009)   Triglycerides: 36  (07/06/2009)  Hypertension   Last Blood Pressure: 100 / 64  (11/01/2009)   Serum creatinine: 1.27  (07/06/2009)   Serum potassium 3.7  (07/06/2009)    Hypertension flowsheet reviewed?: Yes   Progress toward BP goal: At goal  Self-Management Support :    Patient will work on the following items until the next clinic visit to reach  self-care goals:     Medications and monitoring: take my medicines every day  (11/01/2009)     Eating: drink diet soda or water instead of juice or soda, eat more vegetables, use fresh or  frozen vegetables, eat foods that are low in salt, eat baked foods instead of fried foods, eat fruit for snacks and desserts  (11/01/2009)     Other: see a smoking cessation counselor  (11/01/2009)    Hypertension self-management support: Education handout, Resources for patients handout, Written self-care plan  (11/01/2009)   Hypertension self-care plan printed.   Hypertension education handout printed      Resource handout printed.   Nursing Instructions:  Provide Hemoccult cards with instructions (see order) Request report of last colonoscopy    Process Orders Check Orders Results:     [facility]: Check successful Tests Sent for requisitioning (November 03, 2009 9:04 PM):     11/01/2009: [facility] -- T-Basic Metabolic Panel [phone] (signed)     11/01/2009: [facility] -- T-TSH [phone] (signed)     11/01/2009: [facility] -- [place], [place] [[id number]] (signed)

## 2010-06-19 NOTE — Miscellaneous (Signed)
Summary: Orders Update  Clinical Lists Changes  Orders: Added new Test order of T-Basic Metabolic Panel [phone]) - Signed     Process Orders Check Orders Results:     [facility]: Check successful Tests Sent for requisitioning (February 02, 2010 6:09 PM):     02/05/2010: [facility] -- T-Basic Metabolic Panel [phone] (signed)

## 2010-06-19 NOTE — Medication Information (Signed)
Summary: PERCOCET  PERCOCET   Imported By: Margie Billet 10/23/2009 14:47:48  _____________________________________________________________________  External Attachment:    Type:   Image     Comment:   External Document

## 2010-06-19 NOTE — Progress Notes (Signed)
Summary: phone/gg  Phone Note Call from Patient   Summary of Call: I talked with pt about his oxygen use and he reports that he uses O2 every day and when needed at night.  He uses 2 liters most of the time.  I have the paperwork filled out for recertification and can run it up to your office for signing. Initial call taken by: Merrie Roof RN,  December 21, 2009 8:57 AM  Follow-up for Phone Call        Pt coming in 8/17 for O2 sat testing. If pt is tested at exercise he needs all three test done. If he does not qualify we need a discharge order. Asher Muir at [phone]  ex [phone] Follow-up by: Merrie Roof RN,  December 27, 2009 2:25 PM

## 2010-06-19 NOTE — Consult Note (Signed)
Summary: [facility] - Dr. Durene Fruits   Imported By: Margie Billet 09/29/2009 11:15:33  _____________________________________________________________________  External Attachment:    Type:   Image     Comment:   External Document
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
## 2010-06-19 NOTE — Medication Information (Signed)
Summary: Tax adviser   Imported By: Florinda Marker 06/28/2009 15:15:25  _____________________________________________________________________  External Attachment:    Type:   Image     Comment:   External Document

## 2010-06-19 NOTE — Letter (Signed)
Summary: CERTIFICATE OF MEDICAL NECESSITY  CERTIFICATE OF MEDICAL NECESSITY   Imported By: Margie Billet 10/17/2009 14:21:55  _____________________________________________________________________  External Attachment:    Type:   Image     Comment:   External Document

## 2010-06-19 NOTE — Progress Notes (Signed)
Summary: med change/gp  Phone Note From Pharmacy   Summary of Call: [facility]; pt.'s insurance will  no longer pay for Prevacid(lansoprazole).   Nexium and Omeprazole are on the preferred list. Please change.  Thanks Initial call taken by: Chinita Pester RN,  August 04, 2009 4:10 PM  Follow-up for Phone Call        I received another call from patient requesting to change the lansoprazole to something similar. Will change it to omeprazole and [facility].  Follow-up by: Blondell Reveal MD,  August 04, 2009 10:05 PM    New/Updated Medications: OMEPRAZOLE 40 MG CPDR (OMEPRAZOLE) Take 1 tablet by mouth once a day Prescriptions: OMEPRAZOLE 40 MG CPDR (OMEPRAZOLE) Take 1 tablet by mouth once a day  #30 x 3   Entered by:   Blondell Reveal MD   Authorized by:   Jason Coop MD   Signed by:   Blondell Reveal MD on 08/04/2009   Method used:   [facility]ronically to        [facility] [address] [phone][facility] (retail)       [address]       [place], [place]  [address]       Ph: [phone] or [phone]       Fax: [phone]   RxID:   [id number]
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
## 2010-06-19 NOTE — Assessment & Plan Note (Signed)
Summary: per DR JOINESf/u, pain meds, specific pain issues/pcp- Layann Bluett...   Vital Signs:  Patient profile:   65 year old male Height:      72.5 inches Weight:      177.0 pounds BMI:     23.76 Temp:     97.9 degrees F oral Pulse rate:   57 / minute BP sitting:   121 / 71  (right arm)  Vitals Entered By: Filomena Jungling NT II (August 16, 2009 12:33 PM) CC: DISCUSSION Is Patient Diabetic? No Pain Assessment Patient in pain? yes     Location: backs,hips and legs Intensity: 8 Type: aching Onset of pain  Chronic Nutritional Status BMI of 19 -24 = normal  Does patient need assistance? Functional Status Self care Ambulation Normal Comments cane   Primary Care Provider:  Jason Coop MD  CC:  DISCUSSION.  History of Present Illness: The patient presents for his first visit to my clinic; his care was transferred to me as PCP from his previous resident physician Dr. Aleene Davidson.  Today patient reports chronic pain as his main complaint; this symptom appears stable and reasonably well controlled on his current opioid regimen.  He is on chronic warfarin, but says that he has not followed up recently in the anti-coagulation clinic.  He reports that his current regimen is warfarin 5 mg on Sunday, Tuesday, Wednesday, Friday, and Saturday, and 7.5 mg on Monday and Thursday.  He reports that he is compliant with his medications and has not missed recent doses.  He also reports weight loss over the past several months and some loss of appetite.  He is an active smoker.  Preventive Screening-Counseling & Management  Alcohol-Tobacco     Smoking Status: current     Smoking Cessation Counseling: yes  Medications Prior to Update: 1)  Baby Aspirin 81 Mg Chew (Aspirin) .... About 3 X A Week 2)  Percocet 10-650 Mg  Tabs (Oxycodone-Acetaminophen) .... Take One Tab By Mouth Once Every  6  Hours As Needed For Pain 3)  Warfarin Sodium 5 Mg  Tabs (Warfarin Sodium) .... Take1 Tablet By Mouth Daily On  Hold 4)  Multaq 400 Mg Tabs (Dronedarone Hcl) .... Take 1 Tablet By Mouth Bid  A Day 5)  Cardizem Cd 240 Mg Xr24h-Cap (Diltiazem Hcl Coated Beads) .... Take 1 Capsule By Mouth Two Times A Day 6)  Miralax  Powd (Polyethylene Glycol 3350) .... Once A Day 7)  Atenolol 25 Mg Tabs (Atenolol) .... Take 1 Pill By Mouth Daily. 8)  Omeprazole 40 Mg Cpdr (Omeprazole) .... Take 1 Tablet By Mouth Once A Day  Allergies (verified): 1)  ! Pcn  Review of Systems General:  Denies chills and fever. GI:  Denies abdominal pain, bloody stools, dark tarry stools, nausea, and vomiting. GU:  Complains of erectile dysfunction.  Physical Exam  General:  alert, no distress Neck:  supple, no masses, and no thyromegaly.   Lungs:  right basilar rhonchi; otherwise clear Heart:  normal rate, regular rhythm, no murmur, and no gallop.   Abdomen:  soft, non-tender, normal bowel sounds, no hepatomegaly, and no splenomegaly.   Extremities:  no edema   Impression & Recommendations:  Problem # 1:  HYPERTHYROIDISM (ICD-242.90) Patient was referred to endocrinologist (Dr. Sharl Ma) in October of 2010, and at that time a thyroid scan was also scheduled.  Patient reports that he did not have the thyroid scan done and that he has not seen Dr. Sharl Ma.  The plan is to recheck a  TSH and free T4 today, and refer again for endocrinology assessment.  The options for treatment include anti-thyroid medication versus radioactive iodine ablation, and a thyroid scan may be helpful since he does not have (to my exam) a diffusely enlarged thyroid.  Will continue beta-blocker at current dose.  His updated medication list for this problem includes:    Atenolol 25 Mg Tabs (Atenolol) .[name]... Take 1 pill by mouth daily.  Labs Reviewed: TSH: 0.060 (07/06/2009)     Orders: T-TSH ([id number]) T-T4, Free ([id number]) T-CBC w/Diff ([id number])  Problem # 2:  WEIGHT LOSS (FIE-332.95) The etiology is unclear, possibly related to number one above.   Since he is a smoker, will check a chest x-ray today.  Orders: CXR- 2view (CXR)  Problem # 3:  ATRIAL FIBRILLATION (ICD-427.31) Patient is doing well on current regimen; he is followed by cardiologist Dr. Johney Frame.  His updated medication list for this problem includes:    Baby Aspirin 81 Mg Chew (Aspirin) .[name]... About 3 x a week    Warfarin Sodium 5 Mg Tabs (Warfarin sodium) .[name]... Take as directed.    Multaq 400 Mg Tabs (Dronedarone hcl) .[name]... Take 1 tablet by mouth bid  a day    Cardizem Cd 240 Mg Xr24h-cap (Diltiazem hcl coated beads) .[name]... Take 1 capsule by mouth two times a day    Atenolol 25 Mg Tabs (Atenolol) .[name]... Take 1 pill by mouth daily.  Reviewed the following: PT: 16.9 (07/06/2009)   INR: 1.0 (08/16/2009) Coumadin Dose (weekly): 40.0mg /week (03/13/2009) Prior Coumadin Dose (weekly): 40.0mg /week (03/13/2009) Next Protime: 04/10/2009 (dated on 03/13/2009)  Problem # 4:  OTHER CHRONIC PAIN (ICD-338.29) Patient's pain is reasonably well controlled on current regimen; will continue oxycodone/acetaminophen at current dose.  Problem # 5:  TOBACCO ABUSE (ICD-305.1) I advised smoking cessation, and offered referral to our smoking cessation counselor.  Patient will consider this.  Problem # 6:  ERECTILE DYSFUNCTION (ICD-302.72) Patient has been on Viagra in the past, but says he has difficulty affording it.  I'm willing to re-prescribe this, and I advised that he may be able to obtain assistance through the [place] at the [facility] pharmacy.    Problem # 7:  ENCOUNTER FOR LONG-TERM USE OF ANTICOAGULANTS (ICD-V58.61) An INR was checked today, and patient's warfarin dose was adjusted by Dr. Alexandria Lodge.  He has follow-up scheduled with Dr. Alexandria Lodge.  I emphasized the importance of regular follow-up for management of his warfarin.  Orders: T-Protime (in-house) [id number]) T-Protime, Auto [phone])  Complete Medication List: 1)  Baby Aspirin 81 Mg Chew (Aspirin) .... About 3 x a week 2)   Percocet 10-650 Mg Tabs (Oxycodone-acetaminophen) .... Take one tab by mouth once every  6  hours as needed for pain 3)  Warfarin Sodium 5 Mg Tabs (Warfarin sodium) .... Take as directed. 4)  Multaq 400 Mg Tabs (Dronedarone hcl) .... Take 1 tablet by mouth bid  a day 5)  Cardizem Cd 240 Mg Xr24h-cap (Diltiazem hcl coated beads) .... Take 1 capsule by mouth two times a day 6)  Miralax Powd (Polyethylene glycol 3350) .... Once a day 7)  Atenolol 25 Mg Tabs (Atenolol) .... Take 1 pill by mouth daily. 8)  Omeprazole 40 Mg Cpdr (Omeprazole) .... Take 1 tablet by mouth once a day  Other Orders: T-PSA Total ([id number]) T-Hemoccult Card-Multiple (take home) ([id number])  Patient Instructions: 1)  Please schedule a follow-up appointment in 1 month. 2)  Please make appointment with Dr. Alexandria Lodge in the anticoagulation clinic, first available. 3)  Please complete and return the hemoccult cards as instructed. 4)  We have requested an endocrinology appointment for management of hyperthyroidism.  Prevention & Chronic Care Immunizations   Influenza vaccine: Fluvax 3+  (07/06/2009)    Tetanus booster: Not documented    Pneumococcal vaccine: Not documented    H. zoster vaccine: Not documented  Colorectal Screening   Hemoccult: Not documented   Hemoccult action/deferral: Ordered  (08/16/2009)    Colonoscopy: Not documented  Other Screening   PSA: 0.34  (08/28/2006)   PSA ordered.   PSA action/deferral: Discussed-PSA requested  (08/16/2009)  Reports requested:   Last colonoscopy report requested.  Smoking status: current  (08/16/2009)   Smoking cessation counseling: yes  (08/16/2009)    Screening comments: Reports past colonoscopy  Lipids   Total Cholesterol: 122  (07/06/2009)   LDL: 57  (07/06/2009)   LDL Direct: Not documented   HDL: 58  (07/06/2009)   Triglycerides: 36  (07/06/2009)  Hypertension   Last Blood Pressure: 121 / 71  (08/16/2009)   Serum creatinine: 1.27  (07/06/2009)    Serum potassium 3.7  (07/06/2009)    Hypertension flowsheet reviewed?: Yes   Progress toward BP goal: At goal  Self-Management Support :    Patient will work on the following items until the next clinic visit to reach self-care goals:     Medications and monitoring: take my medicines every day  (08/16/2009)     Eating: drink diet soda or water instead of juice or soda, eat more vegetables, use fresh or frozen vegetables, eat foods that are low in salt, eat baked foods instead of fried foods, eat fruit for snacks and desserts  (08/16/2009)    Hypertension self-management support: Education handout, Resources for patients handout  (08/16/2009)   Hypertension education handout printed      Resource handout printed.   Nursing Instructions: Provide Hemoccult cards with instructions (see order) Request report of last colonoscopy Provide Hemoccult cards with instructions (see order)    Process Orders Check Orders Results:     [facility]: Order checked:     [id number] -- T-PSA Total -- ABN required due to diagnosis (CPT: [id number]) Tests Sent for requisitioning (August 16, 2009 7:02 PM):     08/16/2009: [facility] -- T-TSH [phone] (signed)     08/16/2009: [facility] -- [place], [place] [[id number]] (signed)     08/16/2009: [facility] -- T-CBC w/Diff [[id number]] (signed)     08/16/2009: [facility] -- T-PSA Total [[id number]] (signed)     08/16/2009: [facility] -- T-Protime, Scarlette Calico [[id number]] (signed)    Laboratory Results   Blood Tests   Date/Time Received: August 16, 2009 2:31 PM  Date/Time Reported: Burke Keels  August 16, 2009 2:32 PM    INR: 1.0   (Normal Range: 0.88-1.12   Therap INR: 2.0-3.5) Comments: Protime/INR sent to Main Lab for verification Burke Keels  August 16, 2009 2:33 PM

## 2010-06-19 NOTE — Medication Information (Signed)
Summary: [employer]   [employer]   Imported By: Florinda Marker 07/26/2009 16:20:50  _____________________________________________________________________  External Attachment:    Type:   Image     Comment:   External Document
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
## 2010-06-19 NOTE — Progress Notes (Signed)
Summary: refill/ hla  Phone Note Refill Request Message from:  Fax from Pharmacy on March 30, 2010 11:02 AM  Refills Requested: Medication #1:  WARFARIN SODIUM 5 MG  TABS Take as directed.   Dosage confirmed as above?Dosage Confirmed   Last Refilled: 9/1 last visit 9/29,last labs 8/18  Initial call taken by: Marin Roberts RN,  March 30, 2010 11:02 AM  Follow-up for Phone Call        Refilled electronically. Please schedule an appointment with Dr. Alexandria Lodge within 2 weeks and notify patient. Follow-up by: Margarito Liner MD,  March 30, 2010 4:35 PM    Prescriptions: WARFARIN SODIUM 5 MG  TABS (WARFARIN SODIUM) Take as directed.  #60 x 0   Entered and Authorized by:   Margarito Liner MD   Signed by:   Margarito Liner MD on 03/30/2010   Method used:   [facility]        [facility]  [address] [phone]* (retail)       [address]       [place], [place]  [address]       Ph: [phone] or [phone]       Fax: [phone]   RxID:   [id number]

## 2010-06-19 NOTE — Progress Notes (Signed)
Summary: refill/ hla  Phone Note Refill Request Message from:  Patient on November 21, 2009 11:45 AM  Refills Requested: Medication #1:  PERCOCET 10-650 MG  TABS take one tab by mouth once every  6  hours as needed for pain   Dosage confirmed as above?Dosage Confirmed   Last Refilled: 6/6 last visit and labs 6/15  Initial call taken by: Marin Roberts RN,  November 21, 2009 11:45 AM  Follow-up for Phone Call        Rx printed and signed - nurse to complete. Follow-up by: Margarito Liner MD,  November 21, 2009 3:02 PM  Additional Follow-up for Phone Call Additional follow up Details #1::        Pt informed Rx is ready Additional Follow-up by: Merrie Roof RN,  November 21, 2009 3:15 PM    Prescriptions: PERCOCET 10-650 MG  TABS (OXYCODONE-ACETAMINOPHEN) take one tab by mouth once every  6  hours as needed for pain  #120 x 0   Entered and Authorized by:   Margarito Liner MD   Signed by:   Margarito Liner MD on 11/21/2009   Method used:   Print then Give to Patient   RxID:   [id number]

## 2010-06-19 NOTE — Assessment & Plan Note (Signed)
Summary: COU/CH  Anticoagulant Therapy Managed by: Barbera Setters. Janie Morning  PharmD CACP PCP: Jason Coop MD [facility] AttendingCoralee Pesa MD, Levada Schilling Indication 1: Atrial fibrillation Indication 2: Encounter for therapeutic drug monitoring  V58.83 Usual Lab: [facility] Start date: 08/04/2008  Patient Assessment Reviewed by: Chancy Milroy PharmD  October 23, 2009 Medication review: verified warfarin dosage & schedule,verified previous prescription medications, verified doses & any changes, verified new medications, reviewed OTC medications, reviewed OTC health products-vitamins supplements etc Complications: none Dietary changes: none   Health status changes: none   Lifestyle changes: none   Recent/future hospitalizations: none   Recent/future procedures: none   Recent/future dental: none Patient Assessment Part 2:  Have you MISSED ANY DOSES or CHANGED TABLETS?  No missed Warfarin doses or changed tablets.  Have you had any BRUISING or BLEEDING ( nose or gum bleeds,blood in urine or stool)?  No reported bruising or bleeding in nose, gums, urine, stool.  Have you STARTED or STOPPED any MEDICATIONS, including OTC meds,herbals or supplements?  No other medications or herbal supplements were started or stopped.  Have you CHANGED your DIET, especially green vegetables,or ALCOHOL intake?  No changes in diet or alcohol intake.  Have you had any ILLNESSES or HOSPITALIZATIONS?  No reported illnesses or hospitalizations  Have you had any signs of CLOTTING?(chest discomfort,dizziness,shortness of breath,arms tingling,slurred speech,swelling or redness in leg)    No chest discomfort, dizziness, shortness of breath, tingling in arm, slurred speech, swelling, or redness in leg.     Treatment  Target INR: 2.0-3.0 INR: 3.4  Date: 10/23/2009 Regimen In:  62.5mg /week INR reflects regimen in: 3.4  New  Tablet strength: : 5mg  Next INR Due: 11/27/2009 Adjusted by: Barbera Setters. Alexandria Lodge III PharmD CACP     Return to anticoagulation clinic:  11/27/2009 Time of next visit: 1400    Allergies: 1)  ! Pcn  Appended Document: COU/CH Dosage Out: Su-7.5mg M-7.5mg T-10mg W-10mg Th-7.5mg F-10mg Sa-7.5mg   = 60mg /wk. INR "IN" reflected a weekly dose of 62.5mg /wk.

## 2010-06-19 NOTE — Progress Notes (Signed)
Summary: Refill/gh  Phone Note Refill Request Message from:  Fax from Pharmacy on February 15, 2010 3:52 PM  Refills Requested: Medication #1:  WARFARIN SODIUM 5 MG  TABS Take as directed.  Method Requested: Electronic Initial call taken by: Angelina Ok RN,  February 15, 2010 3:52 PM  Follow-up for Phone Call        Refilled electronically.  Follow-up by: Margarito Liner MD,  February 16, 2010 3:30 PM    Prescriptions: WARFARIN SODIUM 5 MG  TABS (WARFARIN SODIUM) Take as directed.  #60 x 1   Entered and Authorized by:   Margarito Liner MD   Signed by:   Margarito Liner MD on 02/16/2010   Method used:   [facility]        [facility]  [address] [phone]* (retail)       [address]       [place], [place]  [address]       Ph: [phone] or [phone]       Fax: [phone]   RxID:   [id number]

## 2010-06-19 NOTE — Medication Information (Signed)
Summary: PERCOCET  PERCOCET   Imported By: Margie Billet 08/25/2009 11:11:51  _____________________________________________________________________  External Attachment:    Type:   Image     Comment:   External Document

## 2010-06-19 NOTE — Progress Notes (Signed)
Summary: refill/gg  Phone Note Refill Request  on July 21, 2009 3:37 PM  Refills Requested: Medication #1:  PERCOCET 10-650 MG  TABS take one tab by mouth once every  6  hours as needed for pain   Last Refilled: 06/26/2009 Call when ready at [phone]   Method Requested: Pick up at Office Initial call taken by: Merrie Roof RN,  July 21, 2009 3:37 PM  Follow-up for Phone Call        Rx denied. Please look at the addendum to his urine drug screen done recently. He needs to be seen in the office to discuss if he needs and can get any narcotics in the future.  Follow-up by: Jason Coop MD,  July 24, 2009 7:43 AM

## 2010-06-19 NOTE — Progress Notes (Signed)
Summary: prior authorization-Omeprazole/gp  Phone Note Outgoing Call   Summary of Call: [facility] has exceeded the allowed quantity for Omeprazole 40mg , which is  #90/365 days. [employer] called [phone]) and Omeprazole 40mg  daily has been approved x 1 yr - from 11/27/2009 to 11/28/2010. [facility] Initial call taken by: Chinita Pester RN,  November 27, 2009 10:29 AM

## 2010-06-19 NOTE — Progress Notes (Signed)
Summary: letter request//kg  Phone Note Call from Patient Call back at Home Phone [phone]   Caller: Patient Summary of Call: Call from patient stating he is applying for some sort of insurance.  He states he was denied coverage because when they looked back in his records it showed that he had taken Megace in the past.  The insurance agent stated that this medicine was usually given to "cancer patients" and needed documentation as to why he had taken this medicine and documentation that he does not have/or had cancer.  Pt wishes to get a return call as soon as letter is completed.  Will forward request to PCP. Initial call taken by: Cynda Familia Duncan Dull),  November 15, 2009 12:03 PM  Follow-up for Phone Call        I have reviewed chart and written a letter to [name] stating the indication for Megace previously prescribed in our clinic.  Any decision regarding his suitability for insurance must be made by the insurance company based upon their review of his medical history. Follow-up by: Margarito Liner MD,  November 28, 2009 12:17 PM

## 2010-06-19 NOTE — Medication Information (Signed)
Summary: [employer]   Imported By: Florinda Marker 07/26/2009 16:20:14  _____________________________________________________________________  External Attachment:    Type:   Image     Comment:   External Document

## 2010-06-19 NOTE — Miscellaneous (Signed)
Summary: [facility]   Imported By: Margie Billet 09/04/2009 15:53:58  _____________________________________________________________________  External Attachment:    Type:   Image     Comment:   External Document

## 2010-06-19 NOTE — Assessment & Plan Note (Signed)
Summary: Coumadin Clinic  Anticoagulant Therapy Managed by: Barbera Setters. Janie Morning  PharmD CACP PCP: Jason Coop MD [facility] Attending: Margarito Liner MD Indication 1: Atrial fibrillation Indication 2: Encounter for therapeutic drug monitoring  V58.83 Usual Lab: [facility] Start date: 08/04/2008  Patient Assessment Reviewed by: Chancy Milroy PharmD  August 16, 2009 Medication review: verified warfarin dosage & schedule,verified previous prescription medications, verified doses & any changes, verified new medications, reviewed OTC medications, reviewed OTC health products-vitamins supplements etc Complications: none Dietary changes: none   Health status changes: none   Lifestyle changes: none   Recent/future hospitalizations: none   Recent/future procedures: none   Recent/future dental: none Patient Assessment Part 2:  Have you MISSED ANY DOSES or CHANGED TABLETS?  No missed Warfarin doses or changed tablets.  Have you had any BRUISING or BLEEDING ( nose or gum bleeds,blood in urine or stool)?  No reported bruising or bleeding in nose, gums, urine, stool.  Have you STARTED or STOPPED any MEDICATIONS, including OTC meds,herbals or supplements?  No other medications or herbal supplements were started or stopped.  Have you CHANGED your DIET, especially green vegetables,or ALCOHOL intake?  No changes in diet or alcohol intake.  Have you had any ILLNESSES or HOSPITALIZATIONS?  No reported illnesses or hospitalizations  Have you had any signs of CLOTTING?(chest discomfort,dizziness,shortness of breath,arms tingling,slurred speech,swelling or redness in leg)    No chest discomfort, dizziness, shortness of breath, tingling in arm, slurred speech, swelling, or redness in leg.     Treatment  Target INR: 2.0-3.0 INR: 1.0  Date: 08/16/2009 Regimen In:  40.0mg /week INR reflects regimen in: 1.0  New  Tablet strength: : 5mg  Regimen Out:     Sunday: 1 & 1/2 Tablet     Monday: 1 Tablet  Tuesday: 1 & 1/2 Tablet     Wednesday: 1 & 1/2 Tablet     Thursday: 1 Tablet      Friday: 1 & 1/2 Tablet     Saturday: 1 & 1/2 Tablet Total Weekly: 47.5mg /week mg  Next INR Due: 09/04/2009 Adjusted by: Barbera Setters. Alexandria Lodge III PharmD CACP   Return to anticoagulation clinic:  09/04/2009 Time of next visit: 0945    Allergies: 1)  ! Pcn

## 2010-06-19 NOTE — Assessment & Plan Note (Signed)
Summary: HFU-PER DR GARG(JOINES)/CFB   Vital Signs:  Patient profile:   65 year old male Height:      72.5 inches Weight:      177.9 pounds BMI:     23.88 O2 Sat:      100 % on Room air Temp:     97.6 degrees F oral BP sitting:   119 / 78  (right arm)  Vitals Entered By: Filomena Jungling NT II (February 15, 2010 2:58 PM)  O2 Flow:  Room air CC: HFU Is Patient Diabetic? No Pain Assessment Patient in pain? no      Nutritional Status BMI of 19 -24 = normal  Have you ever been in a relationship where you felt threatened, hurt or afraid?No   Does patient need assistance? Functional Status Self care Ambulation Normal   Primary Care Provider:  Margarito Liner MD  CC:  HFU.  History of Present Illness: 65 yr old gentleman with PMH as mentioned in the EMR comes to the office for a follow up appointment.   1. Patient was admitted to the hospital for A.fib with RVR. Patient is here to follow up.. Patient denies any sob, cp, palpitations since discharge from the hospital and currently.  2. There was some question regarding weight loss during the previous hospital admission. Patient reports that he did have lack of appetite for the last few months and now its better since started on Megace. Patient states that his weight and appetite has been fluctuating in the last 3-4 yrs. He states that he had his colonoscopy was about 6 yrs ago and was normal.   He currently denies any other complaints.   Preventive Screening-Counseling & Management  Alcohol-Tobacco     Alcohol drinks/day: 4+     Smoking Status: current     Smoking Cessation Counseling: yes     Packs/Day: 0.75     Year Started: 1959     Passive Smoke Exposure: yes  Caffeine-Diet-Exercise     Does Patient Exercise: yes     Type of exercise: WALKING     Times/week: AT TIMES  Problems Prior to Update: 1)  Headache  (ICD-784.0) 2)  Depression, Situational, Acute  (ICD-300.4) 3)  Depression, Situational, Acute   (ICD-300.4) 4)  Need Prophylactic Vaccination&inoculation Flu  (ICD-V04.81) 5)  Renal Insufficiency  (ICD-588.9) 6)  Weight Loss  (ICD-783.21) 7)  Bradycardia  (ICD-427.89) 8)  Hyperthyroidism  (ICD-242.90) 9)  Intestinal Obstruction, Hx of  (ICD-V12.79) 10)  Lung Nodule  (ICD-518.89) 11)  Hx of Pneumonia  (ICD-486) 12)  Hypokalemia  (ICD-276.8) 13)  Atrial Fibrillation  (ICD-427.31) 14)  Diarrhea  (ICD-787.91) 15)  Personal History Other Disorder Urinary System  (ICD-V13.09) 16)  Other Primary Cardiomyopathies  (ICD-425.4) 17)  Weakness  (ICD-780.79) 18)  Encounter For Long-term Use of Anticoagulants  (ICD-V58.61) 19)  Leukocytosis Unspecified  (ICD-288.60) 20)  Health Screening  (ICD-V70.0) 21)  Atrial Fibrillation With Rapid Ventricular Response  (ICD-427.31) 22)  Edema  (ICD-782.3) 23)  Other Chronic Pain  (ICD-338.29) 24)  Intermittent Claudication  (ICD-443.9) 25)  G E R D  (ICD-530.81) 26)  Osteoarthritis  (ICD-715.90) 27)  Hydrocele Nos  (ICD-603.9) 28)  Thrombocytopenia  (ICD-287.5) 29)  Emphysema  (ICD-492.8) 30)  Myalgia  (ICD-729.1) 31)  Scrotal Mass  (ICD-608.89) 32)  Disorder, 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 Once Daily 2)  Percocet 10-650 Mg  Tabs (Oxycodone-Acetaminophen) .... Take One Tab By Mouth Once Every  6  Hours As Needed For Pain 3)  Warfarin Sodium 5 Mg  Tabs (Warfarin Sodium) .... Take As Directed. 4)  Multaq 400 Mg Tabs (Dronedarone Hcl) .... Take 1 Tablet By Mouth Two Times A  Day 5)  Cardizem Cd 240 Mg Xr24h-Cap (Diltiazem Hcl Coated Beads) .... Take 1 Capsule By Mouth Two Times A Day 6)  Miralax  Powd (Polyethylene Glycol 3350) .... Once A Day 7)  Atenolol 25 Mg Tabs (Atenolol) .... Take 1 Pill By Mouth Daily. 8)  Omeprazole 40 Mg Cpdr (Omeprazole) .... Take 1 Tablet By Mouth Once A Day 9)  Sumatriptan Succinate 50 Mg Tabs (Sumatriptan Succinate) .... Take One Tablet By Mouth Once You Feel Your Headache Is Approaching; May Take The Second Tablet 2 Hours Later. 10)  Fluoxetine Hcl 20 Mg Caps (Fluoxetine Hcl) .... Take 1 Tablet By Mouth Once A Day 11)  Megestrol Acetate 400 Mg/88ml Susp (Megestrol Acetate) .... Take 1 Teaspoon By By Mouth Once Daily  Current Medications (verified): 1)  Baby Aspirin 81 Mg Chew (Aspirin) .... Once Daily 2)  Percocet 10-650 Mg  Tabs (Oxycodone-Acetaminophen) .... Take One Tab By Mouth Once Every  6  Hours As Needed For Pain 3)  Warfarin Sodium 5 Mg  Tabs (Warfarin Sodium) .... Take As Directed. 4)  Multaq 400 Mg Tabs (Dronedarone Hcl) .... Take 1 Tablet By Mouth Two Times A Day 5)  Cardizem Cd 240 Mg Xr24h-Cap (Diltiazem Hcl Coated Beads) .... Take 1 Capsule By Mouth Two Times A Day 6)  Miralax  Powd (Polyethylene Glycol 3350) .... Once A Day 7)  Atenolol 25 Mg Tabs (Atenolol) .... Take 1 Pill By Mouth Daily. 8)  Omeprazole 40 Mg Cpdr (Omeprazole) .... Take 1 Tablet By Mouth Once A Day 9)  Sumatriptan Succinate 50 Mg Tabs (Sumatriptan Succinate) .... Take One Tablet By Mouth Once You Feel Your Headache Is Approaching; May Take The Second Tablet 2 Hours Later. 10)  Fluoxetine Hcl 20 Mg Caps (Fluoxetine Hcl) .... Take 1 Tablet By Mouth Once A Day 11)  Megestrol Acetate 400 Mg/3ml Susp (Megestrol Acetate) .... Take 1 Teaspoon By By Mouth Once Daily  Allergies (verified): 1)  ! Pcn  Review of Systems      See HPI  Physical Exam  General:  alert, well-developed, and well-nourished.   Neck:  supple and full ROM.   Lungs:  normal  respiratory effort, no intercostal retractions, no accessory muscle use, and normal breath sounds.   Heart:  normal rate, regular rhythm, no murmur, and no gallop.   Abdomen:  soft and non-tender.   Pulses:  R radial normal.   Extremities:  no edema Neurologic:  alert & oriented X3, cranial nerves II-XII intact, strength normal in all extremities, and sensation intact to light touch.     Impression & Recommendations:  Problem # 1:  ATRIAL FIBRILLATION (ICD-427.31) Currently V-paced and rates in 50's to 60's range. Continue current management.  His updated medication list for this problem includes:    Baby Aspirin 81 Mg Chew (Aspirin) ..... Once daily  Warfarin Sodium 5 Mg Tabs (Warfarin sodium) .[name]... Take as directed.    Multaq 400 Mg Tabs (Dronedarone hcl) .[name]... Take 1 tablet by mouth two times a day    Cardizem Cd 240 Mg Xr24h-cap (Diltiazem hcl coated beads) .[name]... Take 1 capsule by mouth two times a day    Atenolol 25 Mg Tabs (Atenolol) .[name]... Take 1 pill by mouth daily.  Problem # 2:  HYPERTENSION (ICD-401.9) Currently well controlled. Continue current management.  His updated medication list for this problem includes:    Cardizem Cd 240 Mg Xr24h-cap (Diltiazem hcl coated beads) .[name]... Take 1 capsule by mouth two times a day    Atenolol 25 Mg Tabs (Atenolol) .[name]... Take 1 pill by mouth daily.  BP today: 119/78 Prior BP: 104/70 (01/19/2010)  Prior 10 Yr Risk Heart Disease: 18 % (09/13/2008)  Labs Reviewed: K+: 5.4 (01/04/2010) Creat: : 1.96 (01/04/2010)   Chol: 122 (07/06/2009)   HDL: 58 (07/06/2009)   LDL: 57 (07/06/2009)   TG: 36 (07/06/2009)  Complete Medication List: 1)  Baby Aspirin 81 Mg Chew (Aspirin) .... Once daily 2)  Percocet 10-650 Mg Tabs (Oxycodone-acetaminophen) .... Take one tab by mouth once every  6  hours as needed for pain 3)  Warfarin Sodium 5 Mg Tabs (Warfarin sodium) .... Take as directed. 4)  Multaq 400 Mg Tabs (Dronedarone hcl) .... Take 1 tablet by  mouth two times a day 5)  Cardizem Cd 240 Mg Xr24h-cap (Diltiazem hcl coated beads) .... Take 1 capsule by mouth two times a day 6)  Miralax Powd (Polyethylene glycol 3350) .... Once a day 7)  Atenolol 25 Mg Tabs (Atenolol) .... Take 1 pill by mouth daily. 8)  Omeprazole 40 Mg Cpdr (Omeprazole) .... Take 1 tablet by mouth once a day 9)  Sumatriptan Succinate 50 Mg Tabs (Sumatriptan succinate) .... Take one tablet by mouth once you feel your headache is approaching; may take the second tablet 2 hours later. 10)  Fluoxetine Hcl 20 Mg Caps (Fluoxetine hcl) .... Take 1 tablet by mouth once a day 11)  Megestrol Acetate 400 Mg/32ml Susp (Megestrol acetate) .... Take 1 teaspoon by by mouth once daily  Patient Instructions: 1)  Please schedule a follow-up appointment in 2 months with Dr.Joines. Prescriptions: PERCOCET 10-650 MG  TABS (OXYCODONE-ACETAMINOPHEN) take one tab by mouth once every  6  hours as needed for pain  #120 x 0   Entered and Authorized by:   Blondell Reveal MD   Signed by:   Blondell Reveal MD on 02/15/2010   Method used:   Print then Give to Patient   RxID:   [id number]    Prevention & Chronic Care Immunizations   Influenza vaccine: Fluvax 3+  (01/19/2010)    Tetanus booster: Not documented    Pneumococcal vaccine: Not documented    H. zoster vaccine: Not documented  Colorectal Screening   Hemoccult: Not documented   Hemoccult action/deferral: Ordered  (01/03/2010)    Colonoscopy: Not documented   Colonoscopy action/deferral: GI referral  (01/03/2010)  Other Screening   PSA: 0.36  (08/16/2009)   PSA action/deferral: Discussed-PSA requested  (08/16/2009)   Smoking status: current  (02/15/2010)   Smoking cessation counseling: yes  (02/15/2010)  Lipids   Total Cholesterol: 122  (07/06/2009)   LDL: 57  (07/06/2009)   LDL Direct: Not documented   HDL: 58  (07/06/2009)   Triglycerides: 36  (07/06/2009)  Hypertension   Last Blood Pressure: 119 / 78   (02/15/2010)   Serum  creatinine: 1.96  (01/04/2010)   Serum potassium 5.4  (01/04/2010)  Self-Management Support :   Personal Goals (by the next clinic visit) :      Personal blood pressure goal: 140/90  (01/19/2010)   Patient will work on the following items until the next clinic visit to reach self-care goals:     Medications and monitoring: take my medicines every day  (02/15/2010)     Eating: drink diet soda or water instead of juice or soda, eat more vegetables, use fresh or frozen vegetables, eat foods that are low in salt, eat baked foods instead of fried foods, eat fruit for snacks and desserts, limit or avoid alcohol  (02/15/2010)     Other: see a smoking cessation counselor  (11/01/2009)    Hypertension self-management support: Education handout  (02/15/2010)   Hypertension education handout printed

## 2010-06-19 NOTE — Progress Notes (Signed)
Summary: refill/gg  Phone Note Refill Request  on June 23, 2009 4:32 PM  Refills Requested: Medication #1:  PERCOCET 10-650 MG  TABS take one tab by mouth once every  6  hours as needed for pain   Last Refilled: 05/26/2009 call when ready [phone]   Method Requested: Pick up at Office Initial call taken by: Merrie Roof RN,  June 23, 2009 4:33 PM  Follow-up for Phone Call       Follow-up by: Jason Coop MD,  June 26, 2009 3:16 PM    Prescriptions: PERCOCET 10-650 MG  TABS (OXYCODONE-ACETAMINOPHEN) take one tab by mouth once every  6  hours as needed for pain  #120 x 0   Entered and Authorized by:   Jason Coop MD   Signed by:   Jason Coop MD on 06/26/2009   Method used:   Print then Give to Patient   RxID:   [id number]   Appended Document: refill/gg Pt informed Rx is ready

## 2010-06-19 NOTE — Assessment & Plan Note (Signed)
Summary: CMN for O2/gg   Vital Signs:  Patient profile:   65 year old male Height:      72.5 inches Weight:      176.1 pounds BMI:     23.64 O2 Sat:      100 % on Room air Temp:     98.3 degrees F oral Pulse rate:   50 / minute BP sitting:   94 / 64  (right arm)  Vitals Entered By: Filomena Jungling NT II (January 03, 2010 4:36 PM)  O2 Flow:  Room air  Serial Vital Signs/Assessments:                                PEF    PreRx  PostRx Time      O2 Sat  O2 Type     L/min  L/min  L/min   By 5:00 PM   97  %   Room air                          Margarito Liner MD  Comments: 5:00 PM after wallking  By: Margarito Liner MD   CC: FEELS REALLY TIRED Is Patient Diabetic? No Pain Assessment Patient in pain? no      Nutritional Status BMI of 19 -24 = normal  Have you ever been in a relationship where you felt threatened, hurt or afraid?No   Does patient need assistance? Functional Status Self care Ambulation Normal   Primary Care Provider:  Margarito Liner MD  CC:  FEELS REALLY TIRED.  History of Present Illness: Patient returns for evaluation of his need for home oxygen that was previously prescribed following an episode of pneumonia with hypoxia.  He reports that he is still using the oxygen at home. Today he has no respiratory complaints. His main complaint is ongoing weight loss and loss of appetite.  Preventive Screening-Counseling & Management  Alcohol-Tobacco     Alcohol drinks/day: 4+     Smoking Status: current     Smoking Cessation Counseling: yes     Packs/Day: 0.75     Year Started: 1959     Passive Smoke Exposure: yes  Caffeine-Diet-Exercise     Does Patient Exercise: yes     Type of exercise: WALKING     Times/week: AT TIMES  Allergies: 1)  ! Pcn  Review of Systems General:  Denies chills, fever, and sweats. CV:  occasional shortness of breath; a little swelling of legs, not much per patient. Resp:  Complains of cough; white phlegm. GI:  Denies abdominal  pain, bloody stools, dark tarry stools, nausea, and vomiting. GU:  Denies dysuria. MS:  Complains of muscle aches.  Physical Exam  General:  alert, no acute distress Lungs:  decreased breath sounds left base; few bibasilar rales Heart:  normal rate, regular rhythm, no murmur, no gallop, and no rub.   Abdomen:  soft; mild epigastric tenderness; no hepatosplenomegaly Extremities:  no edema   Impression & Recommendations:  Problem # 1:  Hx of PNEUMONIA (ICD-486) Patient had good oxygen saturations on room air today in clinic both at rest and after walking around the clinic hallway. The plan is to arrange overnight oxygen saturation monitoring at home on room air to see if he desaturates. If not, then continued oxygen therapy is not currently indicated.  Problem # 2:  WEIGHT LOSS (ICD-783.21) Patient has ongoing weight loss and  the cause is unclear. The plan is to check labs as below and to obtain a report of his prior colonoscopy. This may need to be repeated if not done within a recent time frame. I will obtain a chest x-ray given his smoking history.  Orders: CXR- 2view (CXR) T-Comprehensive Metabolic Panel [phone]) T-CBC w/Diff [phone]) T-TSH [phone]) T-T4, Free [phone]) T-Sed Rate (Automated) [phone]) T-Lactate Dehydrogenase (LDH) [phone]) T-CRP (C-Reactive Protein) ([id number]) T-HIV Antibody  (Reflex) ([id number])  Complete Medication List: 1)  Baby Aspirin 81 Mg Chew (Aspirin) .... Once daily 2)  Percocet 10-650 Mg Tabs (Oxycodone-acetaminophen) .... Take one tab by mouth once every  6  hours as needed for pain 3)  Warfarin Sodium 5 Mg Tabs (Warfarin sodium) .... Take as directed. 4)  Multaq 400 Mg Tabs (Dronedarone hcl) .... Take 1 tablet by mouth two times a day 5)  Cardizem Cd 240 Mg Xr24h-cap (Diltiazem hcl coated beads) .... Take 1 capsule by mouth two times a day 6)  Miralax Powd (Polyethylene glycol 3350) .... Once a day 7)  Atenolol 25 Mg  Tabs (Atenolol) .... Take 1 pill by mouth daily. 8)  Omeprazole 40 Mg Cpdr (Omeprazole) .... Take 1 tablet by mouth once a day  Other Orders: T-Hemoccult Card-Multiple (take home) ([id number])  Patient Instructions: 1)  Please schedule a follow-up appointment in 1 month. Process Orders Check Orders Results:     [facility]: Check successful Tests Sent for requisitioning (January 05, 2010 6:04 PM):     01/03/2010: [facility] -- T-Comprehensive Metabolic Panel [[id number]] (signed)     01/03/2010: [facility] -- T-CBC w/Diff [[id number]] (signed)     01/03/2010: [facility] -- T-TSH [phone] (signed)     01/03/2010: [facility] -- [place], [place] [[id number]] (signed)     01/03/2010: [facility] -- T-Sed Rate (Automated) [phone] (signed)     01/03/2010: [facility] -- T-Lactate Dehydrogenase (LDH) [phone] (signed)     01/03/2010: [facility] -- T-CRP (C-Reactive Protein) [[id number]] (signed)     01/03/2010: [facility] -- T-HIV Antibody  (Reflex) [[id number]] (signed)     Prevention & Chronic Care Immunizations   Influenza vaccine: Fluvax 3+  (07/06/2009)    Tetanus booster: Not documented    Pneumococcal vaccine: Not documented    H. zoster vaccine: Not documented  Colorectal Screening   Hemoccult: Not documented   Hemoccult action/deferral: Ordered  (01/03/2010)    Colonoscopy: Not documented   Colonoscopy action/deferral: GI referral  (01/03/2010)  Other Screening   PSA: 0.36  (08/16/2009)   PSA action/deferral: Discussed-PSA requested  (08/16/2009)   Smoking status: current  (01/03/2010)   Smoking cessation counseling: yes  (01/03/2010)  Lipids   Total Cholesterol: 122  (07/06/2009)   LDL: 57  (07/06/2009)   LDL Direct: Not documented   HDL: 58  (07/06/2009)   Triglycerides: 36  (07/06/2009)  Hypertension    Last Blood Pressure: 94 / 64  (01/03/2010)   Serum creatinine: 1.75  (12/12/2009)   Serum potassium 4.7  (12/12/2009) CMP ordered     Hypertension flowsheet reviewed?: Yes   Progress toward BP goal: At goal  Self-Management Support :    Hypertension self-management support: Education handout, Resources for patients handout, Written self-care plan  (11/01/2009)   Nursing Instructions: GI referral for screening colonoscopy (see order) Provide Hemoccult cards with instructions (see order)

## 2010-06-19 NOTE — Miscellaneous (Signed)
Summary: [facility]   Imported By: Margie Billet 10/02/2009 15:31:38  _____________________________________________________________________  External Attachment:    Type:   Image     Comment:   External Document

## 2010-06-19 NOTE — Medication Information (Signed)
Summary: PERCOCET  PERCOCET   Imported By: Margie Billet 11/23/2009 11:09:42  _____________________________________________________________________  External Attachment:    Type:   Image     Comment:   External Document

## 2010-06-19 NOTE — Medication Information (Signed)
Summary: [employer]   Imported By: Florinda Marker 05/29/2009 14:42:13  _____________________________________________________________________  External Attachment:    Type:   Image     Comment:   External Document

## 2010-06-19 NOTE — Progress Notes (Signed)
Summary: med refill/gp  Phone Note Refill Request Message from:  Patient on December 21, 2009 10:23 AM  Refills Requested: Medication #1:  PERCOCET 10-650 MG  TABS take one tab by mouth once every  6  hours as needed for pain   Last Refilled: 11/21/2009 Last appt. June 15 w/labs.   Method Requested: Pick up at Office Initial call taken by: Chinita Pester RN,  December 21, 2009 10:23 AM  Follow-up for Phone Call        Refill signed-nurse to complete. Follow-up by: Margarito Liner MD,  December 21, 2009 7:20 PM  Additional Follow-up for Phone Call Additional follow up Details #1::        Pt informed Rx is ready for pick-up Additional Follow-up by: Merrie Roof RN,  December 22, 2009 10:52 AM    Prescriptions: PERCOCET 10-650 MG  TABS (OXYCODONE-ACETAMINOPHEN) take one tab by mouth once every  6  hours as needed for pain  #120 x 0   Entered and Authorized by:   Margarito Liner MD   Signed by:   Margarito Liner MD on 12/21/2009   Method used:   Print then Give to Patient   RxID:   [id number]

## 2010-06-19 NOTE — Miscellaneous (Signed)
Summary: Medication Contract  Medication Contract   Imported By: Florinda Marker 07/07/2009 14:39:09  _____________________________________________________________________  External Attachment:    Type:   Image     Comment:   External Document

## 2010-06-19 NOTE — Medication Information (Signed)
Summary: PERCOCET  PERCOCET   Imported By: Margie Billet 04/02/2010 11:21:29  _____________________________________________________________________  External Attachment:    Type:   Image     Comment:   External Document

## 2010-06-19 NOTE — Progress Notes (Signed)
Summary: refill/gg  Phone Note Refill Request  on August 24, 2009 2:08 PM  Refills Requested: Medication #1:  PERCOCET 10-650 MG  TABS take one tab by mouth once every  6  hours as needed for pain   Last Refilled: 07/24/2009  Method Requested: Pick up at Office Initial call taken by: Merrie Roof RN,  August 24, 2009 2:08 PM  Follow-up for Phone Call        Prescription printed and signed - nurse to complete. Follow-up by: Margarito Liner MD,  August 24, 2009 4:16 PM  Additional Follow-up for Phone Call Additional follow up Details #1::        Pt informed Rx ready Additional Follow-up by: Merrie Roof RN,  August 24, 2009 4:21 PM    Prescriptions: PERCOCET 10-650 MG  TABS (OXYCODONE-ACETAMINOPHEN) take one tab by mouth once every  6  hours as needed for pain  #120 x 0   Entered and Authorized by:   Margarito Liner MD   Signed by:   Margarito Liner MD on 08/24/2009   Method used:   Print then Give to Patient   RxID:   [id number]

## 2010-06-20 ENCOUNTER — Other Ambulatory Visit: Payer: Self-pay | Admitting: Internal Medicine

## 2010-06-20 ENCOUNTER — Encounter (INDEPENDENT_AMBULATORY_CARE_PROVIDER_SITE_OTHER): Payer: Medicare Other | Admitting: Internal Medicine

## 2010-06-20 ENCOUNTER — Encounter: Payer: Self-pay | Admitting: Internal Medicine

## 2010-06-20 ENCOUNTER — Ambulatory Visit: Admit: 2010-06-20 | Payer: Self-pay | Admitting: Internal Medicine

## 2010-06-20 DIAGNOSIS — I495 Sick sinus syndrome: Secondary | ICD-10-CM

## 2010-06-20 DIAGNOSIS — I4892 Unspecified atrial flutter: Secondary | ICD-10-CM

## 2010-06-20 DIAGNOSIS — I5042 Chronic combined systolic (congestive) and diastolic (congestive) heart failure: Secondary | ICD-10-CM | POA: Insufficient documentation

## 2010-06-20 DIAGNOSIS — I4891 Unspecified atrial fibrillation: Secondary | ICD-10-CM

## 2010-06-20 DIAGNOSIS — I5022 Chronic systolic (congestive) heart failure: Secondary | ICD-10-CM

## 2010-06-20 LAB — HEPATIC FUNCTION PANEL
Albumin: 3.8 g/dL (ref 3.5–5.2)
Total Protein: 6.4 g/dL (ref 6.0–8.3)

## 2010-06-20 LAB — BASIC METABOLIC PANEL
BUN: 17 mg/dL (ref 6–23)
Calcium: 9.3 mg/dL (ref 8.4–10.5)
GFR: 61.56 mL/min (ref >60.00)
Glucose, Bld: 78 mg/dL (ref 70–99)

## 2010-06-21 ENCOUNTER — Other Ambulatory Visit: Payer: Self-pay | Admitting: *Deleted

## 2010-06-21 NOTE — Assessment & Plan Note (Signed)
Summary: CHECKUP/SB.   Vital Signs:  Patient profile:   65 year old male Height:      72.5 inches Weight:      166.1 pounds BMI:     22.30 Temp:     97.2 degrees F oral Pulse rate:   52 / minute BP sitting:   99 / 63  (right arm)  Vitals Entered By: Filomena Jungling NT II (May 30, 2010 11:23 AM) CC: NEED  A RX FOR CANE, Depression Pain Assessment Patient in pain? yes     Location: hips Intensity: 7 Type: aching Onset of pain  Chronic Nutritional Status BMI of 19 -24 = normal  Have you ever been in a relationship where you felt threatened, hurt or afraid?No   Does patient need assistance? Functional Status Self care Ambulation Normal   Primary Care Provider:  Margarito Liner MD  CC:  NEED  A RX FOR CANE and Depression.  History of Present Illness: Patient returns for follow-up of his osteoarthritis with chronic pain, weight loss, renal insufficiency, and other chronic medical problems.  He reports some increase in his chronic bilateral hip pain, and he requested and adjustable 4-point cane.  He did not bring his medications to clinic, but confirmed his med list; he reports that he is compliant with his medications.  His pain is controlled on his current Percocet regimen.    Depression History:      The patient denies a depressed mood most of the day and a diminished interest in his usual daily activities.         -  Date:  02/06/2010    BUN: 22    Creatinine: 1.32    Sodium: 138    Potassium: 4.1    Chloride: 105    CO2 Total: 25    Calcium: 8.8    Cholesterol: 98    LDL: 55    HDL: 33    Triglycerides: 49  Current Medications (verified): 1)  Percocet 10-650 Mg  Tabs (Oxycodone-Acetaminophen) .... Take One Tab By Mouth Once Every  6  Hours As Needed For Pain 2)  Warfarin Sodium 5 Mg  Tabs (Warfarin Sodium) .... Take As Directed. 3)  Multaq 400 Mg Tabs (Dronedarone Hcl) .... Take 1 Tablet By Mouth Two Times A Day 4)  Cardizem Cd 240 Mg Xr24h-Cap (Diltiazem Hcl  Coated Beads) .... Take 1 Capsule By Mouth Two Times A Day 5)  Miralax  Powd (Polyethylene Glycol 3350) .... Once A Day 6)  Atenolol 25 Mg Tabs (Atenolol) .... Take 1 Pill By Mouth Daily. 7)  Omeprazole 40 Mg Cpdr (Omeprazole) .... Take 1 Tablet By Mouth Once A Day 8)  Sumatriptan Succinate 50 Mg Tabs (Sumatriptan Succinate) .... Take One Tablet By Mouth Once You Feel Your Headache Is Approaching; May Take The Second Tablet 2 Hours Later. 9)  Fluoxetine Hcl 20 Mg Caps (Fluoxetine Hcl) .... Take 1 Tablet By Mouth Once A Day 10)  Megestrol Acetate 400 Mg/40ml Susp (Megestrol Acetate) .... Take 1 Teaspoon By By Mouth Once Daily  Allergies (verified): 1)  ! Pcn  Family History: Reviewed history from 11/01/2009 and no changes required. Family History of Alcoholism/Addiction-Father now deceased Family History of Asthma-Mother deceased at age 29 Mom had CAD Father passed of alcohol related problems in his 50s No colon cancer No lung cancer No prostate cancer  Social History: Reviewed history from 11/01/2009 and no changes required. SUBSTANCE:     Current Smoker 3/4 ppd x 30+ years  Alcohol use-former heavy use - none since last year  Denies cocaine, crack  Denies current marijuana use.  SOCIAL:    Retired. 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 Very limited Asbestos exposure     10 children that he had with the same woman - she died from cancer in 30-Jun-2002    Recently lost his lease in [place]. Now renting a room in a house. (07/2008)     Lives with one of his sons.     Poor social support - no one he can ask for help with his meds, etc. (07/26/2008)  Review of Systems General:  Complains of weight loss; denies chills, fever, and sweats; appetite not good chronically. ENT:  Denies difficulty swallowing and sore throat. CV:  Complains of difficulty breathing at night  and difficulty breathing while lying down; denies chest pain or discomfort, fainting, and palpitations; chronic bilaterall leg edema. Resp:  Complains of cough; denies chest discomfort, coughing up blood, and wheezing; cough is productive of clear sputum; reports stable mild DOE. GI:  Denies abdominal pain, bloody stools, dark tarry stools, diarrhea, nausea, and vomiting. GU:  Denies dysuria and urinary hesitancy. MS:  Complains of muscle aches. Psych:  Denies suicidal thoughts/plans; reports mild depression.  Physical Exam  General:  alert, no distress Lungs:  normal respiratory effort, normal breath sounds, no crackles, and no wheezes.   Heart:  normal rate and regular rhythm.   Abdomen:  soft, non-tender, normal bowel sounds, no hepatomegaly, and no splenomegaly.   Extremities:  no edema   Impression & Recommendations:  Problem # 1:  OSTEOARTHRITIS (ICD-715.90) Patient has chronic low back and bilateral hip pain which is controlled on current dose of Percocet.  Will continue at current dose.  I gave a handwritten prescription for an adjustable 4-point cane.  The following medications were removed from the medication list:    Baby Aspirin 81 Mg Chew (Aspirin) ..... Once daily His updated medication list for this problem includes:    Percocet 10-650 Mg Tabs (Oxycodone-acetaminophen) .[name]... Take one tab by mouth once every  6  hours as needed for pain  Problem # 2:  WEIGHT LOSS (ICD-783.21) The etiology of this is not clear.  Patient reports having had a prior colonoscopy; will try to obtain report.  He may need repeat abdominal imaging and/or colonoscopy.  I discussed concerns with patient.  WIll check labs as below including HIV test (patient consented).  Problem # 3:  ATRIAL FIBRILLATION (ICD-427.31) Appears to be maintaining sinus rhythm on Multaq.  INR is 4.1; I advised that he hold warfarin for 1 dose, then reduce Friday dose as below, then follow-up with Dr. Alexandria Lodge on  Monday.  The following medications were removed from the medication list:    Baby Aspirin 81 Mg Chew (Aspirin) ..... Once daily His updated medication list for this problem includes:    Warfarin Sodium 5 Mg Tabs (Warfarin sodium) .[name]... Take as directed.    Multaq 400 Mg Tabs (Dronedarone hcl) .[name]... Take 1 tablet by mouth two times a day    Cardizem Cd 240 Mg Xr24h-cap (Diltiazem hcl coated beads) .[name]... Take 1 capsule by mouth two times a day    Atenolol 25 Mg Tabs (Atenolol) .[name]... Take 1 pill by mouth daily.  Reviewed the following: PT: 13.7 (08/16/2009)   INR: 4.1 (05/30/2010) Coumadin Dose (weekly): 67.5mg /week (  05/07/2010) Prior Coumadin Dose (weekly): 67.5mg /week (05/07/2010) Next Protime: 05/28/2010 (dated on 05/07/2010)  Problem # 4:  EMPHYSEMA (ICD-492.8) Patient was previously on Advair and ipratropium; it is unclear why these were stopped.  He may benefit from resumption of inhaled bronchodilators and inhaled steroid.  Plan is to review chart with regard to prior treatment and also see if PFTs have been done.  I also previously ordered overnight oximetry, but do not have a report of that study; will request copy of report.  Problem # 5:  TOBACCO ABUSE (ICD-305.1) I discussed and advised stopping smoking.  Problem # 6:  RENAL INSUFFICIENCY (ICD-588.9) Patient's creatinine improved during his last hospitalization.  Will check a metabolic panel.  Problem # 7:  Hx of HYPERTHYROIDISM (ICD-242.90) WIll check TSH and free T4.  His updated medication list for this problem includes:    Atenolol 25 Mg Tabs (Atenolol) .[name]... Take 1 pill by mouth daily.  Labs Reviewed: TSH: 1.474 (01/19/2010)     Complete Medication List: 1)  Percocet 10-650 Mg Tabs (Oxycodone-acetaminophen) .... Take one tab by mouth once every  6  hours as needed for pain 2)  Warfarin Sodium 5 Mg Tabs (Warfarin sodium) .... Take as directed. 3)  Multaq 400 Mg Tabs (Dronedarone hcl) .... Take 1 tablet by mouth two  times a day 4)  Cardizem Cd 240 Mg Xr24h-cap (Diltiazem hcl coated beads) .... Take 1 capsule by mouth two times a day 5)  Miralax Powd (Polyethylene glycol 3350) .... Once a day 6)  Atenolol 25 Mg Tabs (Atenolol) .... Take 1 pill by mouth daily. 7)  Omeprazole 40 Mg Cpdr (Omeprazole) .... Take 1 tablet by mouth once a day 8)  Sumatriptan Succinate 50 Mg Tabs (Sumatriptan succinate) .... Take one tablet by mouth once you feel your headache is approaching; may take the second tablet 2 hours later. 9)  Fluoxetine Hcl 20 Mg Caps (Fluoxetine hcl) .... Take 1 tablet by mouth once a day 10)  Megestrol Acetate 400 Mg/15ml Susp (Megestrol acetate) .... Take 1 teaspoon by by mouth once daily  Other Orders: T-Protime (in-house) (16109UE) T-CMP with Estimated GFR ([address]) T-CBC w/Diff (14782-95621) T-HIV Antibody  (Reflex) [phone]) T-TSH [phone]) T-Sed Rate (Automated) [phone]) T-T4, Free ([id number]) T-Hemoccult Card-Multiple (take home) (59563)  Patient Instructions: 1)  Please schedule a follow-up appointment in 6 weeks. 2)  Please schedule a follow-up appointment with Dr.Groce for next Monday. 3)  Hold Coumadin (warfarin) today, then resume as before but decrease Friday dose to 1 and 1/2 tablets.   Orders Added: 1)  T-Protime (in-house) [87564PP] 2)  T-CMP with Estimated GFR [[address]] 3)  T-CBC w/Diff [29518-84166] 4)  T-HIV Antibody  (Reflex) [[id number]] 5)  T-TSH [32355-73220] 6)  T-Sed Rate (Automated) [25427-06237] 7)  T-T4, Free [62831-51761] 8)  T-Hemoccult Card-Multiple (take home) [82270] 9)  Est. Patient Level IV [60737]   Process Orders Check Orders Results:     [facility]: Check successful Tests Sent for requisitioning (June 01, 2010 12:17 PM):     05/30/2010: [facility] -- T-CMP with Estimated GFR [[id number]] (signed)     05/30/2010: [facility] -- T-CBC w/Diff [[id number]] (signed)      05/30/2010: [facility] -- T-HIV Antibody  (Reflex) [[id number]] (signed)     05/30/2010: [facility] -- T-TSH [[id number]] (signed)     05/30/2010: [facility] -- T-Sed Rate (Automated) [[id number]] (signed)     05/30/2010: [facility] -- [place], [place] [[id number]] (signed)  Prevention & Chronic Care Immunizations   Influenza vaccine: Fluvax 3+  (01/19/2010)    Tetanus booster: Not documented    Pneumococcal vaccine: Not documented    H. zoster vaccine: Not documented    Immunization comments: Thinks he has had a pneumovax.  Colorectal Screening   Hemoccult: Not documented   Hemoccult action/deferral: Ordered  (05/30/2010)    Colonoscopy: Not documented   Colonoscopy action/deferral: GI referral  (01/03/2010)  Other Screening   PSA: 0.36  (08/16/2009)   PSA action/deferral: Discussed-PSA requested  (08/16/2009)  Reports requested:   Last colonoscopy report requested.  Smoking status: current  (02/15/2010)   Smoking cessation counseling: yes  (02/15/2010)  Lipids   Total Cholesterol: 98  (02/06/2010)   LDL: 55  (02/06/2010)   LDL Direct: Not documented   HDL: 33  (02/06/2010)   Triglycerides: 36  (07/06/2009)  Hypertension   Last Blood Pressure: 99 / 63  (05/30/2010)   Serum creatinine: 1.32  (02/06/2010)   Serum potassium 4.1  (02/06/2010)    Hypertension flowsheet reviewed?: Yes   Progress toward BP goal: At goal  Self-Management Support :   Personal Goals (by the next clinic visit) :      Personal blood pressure goal: 140/90  (01/19/2010)   Hypertension self-management support: Education handout  (02/15/2010)   Nursing Instructions: Provide Hemoccult cards with instructions (see order) Request report of last colonoscopy   Laboratory Results   Blood Tests   Date/Time Received: May 30, 2010 12:32 PM Date/Time Reported: Alric Quan  May 30, 2010 12:32 PM    INR: 4.1    (Normal Range: 0.88-1.12   Therap INR: 2.0-3.5)

## 2010-06-21 NOTE — Assessment & Plan Note (Signed)
Summary: COU/CH  Anticoagulant Therapy Managed by: Barbera Setters. Janie Morning  PharmD CACP PCP: Margarito Liner MD [facility] Attending: Margarito Liner MD Indication 1: Atrial fibrillation Indication 2: Encounter for therapeutic drug monitoring  V58.83 Usual Lab: [facility] Start date: 08/04/2008  Patient Assessment Reviewed by: Chancy Milroy PharmD  May 07, 2010 Medication review: verified warfarin dosage & schedule,verified previous prescription medications, verified doses & any changes, verified new medications, reviewed OTC medications, reviewed OTC health products-vitamins supplements etc Complications: none Dietary changes: none   Health status changes: none   Lifestyle changes: none   Recent/future hospitalizations: none   Recent/future procedures: none   Recent/future dental: none Patient Assessment Part 2:  Have you MISSED ANY DOSES or CHANGED TABLETS?  No missed Warfarin doses or changed tablets.  Have you had any BRUISING or BLEEDING ( nose or gum bleeds,blood in urine or stool)?  No reported bruising or bleeding in nose, gums, urine, stool.  Have you STARTED or STOPPED any MEDICATIONS, including OTC meds,herbals or supplements?  No other medications or herbal supplements were started or stopped.  Have you CHANGED your DIET, especially green vegetables,or ALCOHOL intake?  No changes in diet or alcohol intake.  Have you had any ILLNESSES or HOSPITALIZATIONS?  No reported illnesses or hospitalizations  Have you had any signs of CLOTTING?(chest discomfort,dizziness,shortness of breath,arms tingling,slurred speech,swelling or redness in leg)    No chest discomfort, dizziness, shortness of breath, tingling in arm, slurred speech, swelling, or redness in leg.     Treatment  Target INR: 2.0-3.0 INR: 3.7  Date: 05/07/2010 Regimen In:  70.0mg /week INR reflects regimen in: 3.7  New  Tablet strength: : 5mg  Regimen Out:     Sunday: 2 Tablet     Monday: 2 Tablet     Tuesday: 2 Tablet  Wednesday: 1 & 1/2 Tablet     Thursday: 2 Tablet      Friday: 2 Tablet     Saturday: 2 Tablet Total Weekly: 67.5mg /week mg  Next INR Due: 05/28/2010 Adjusted by: Barbera Setters. Alexandria Lodge III PharmD CACP   Return to anticoagulation clinic:  05/28/2010 Time of next visit: 1515  Hold:  1 Days     Allergies: 1)  ! Pcn

## 2010-06-21 NOTE — Progress Notes (Signed)
Summary: Refill/gh  Phone Note Refill Request Message from:  Patient on May 21, 2010 1:59 PM  Refills Requested: Medication #1:  PERCOCET 10-650 MG  TABS take one tab by mouth once every  6  hours as needed for pain Call pt when ready   Method Requested: Pick up at Office Initial call taken by: Angelina Ok RN,  May 21, 2010 1:59 PM  Follow-up for Phone Call        spoke w/ pt relayed message that dr Meredith Pel will write 1/3, pt is agreeable Follow-up by: Marin Roberts RN,  May 21, 2010 4:15 PM  Additional Follow-up for Phone Call Additional follow up Details #1::        Rx printed and signed - nurse to complete.  Please remind patient of his appointment with me next week. Additional Follow-up by: Margarito Liner MD,  May 22, 2010 2:12 PM    Additional Follow-up for Phone Call Additional follow up Details #2::    pt informed and reminded Follow-up by: Marin Roberts RN,  May 22, 2010 2:32 PM  Prescriptions: PERCOCET 10-650 MG  TABS (OXYCODONE-ACETAMINOPHEN) take one tab by mouth once every  6  hours as needed for pain  #120 x 0   Entered and Authorized by:   Margarito Liner MD   Signed by:   Margarito Liner MD on 05/22/2010   Method used:   Print then Give to Patient   RxID:   [id number]

## 2010-06-21 NOTE — Discharge Summary (Signed)
Summary: Hospital Discharge Update    Hospital Discharge Update:  Date of Admission: 06/01/2010 Date of Discharge: 06/03/2010  Brief Summary:  Patient was asdmitted by Dr Meredith Pel for acute renal failure and workup of weight loss. His CT pelvis was s/o rectal wall thickening concerning for inflammation vs malignancy. Patient was seen by GI inpatient but since his INR was therapeutic, the colonoscopy was pending when patient got angry and wanted to leave the hospital with request to do colonoscopy as outpatient. His renal function improved to baseline and no cause could be identified, multaq was discontinued at discharge. Patient was bradycardic and hypotensive on his home dose BP meds and felt dizzy, the concern for aggressive BP control led [place] to d/c his atenolol at discharge.  Lab or other results pending at discharge:  none  Labs needed at follow-up: Basic metabolic panel  Other follow-up issues:  1.Patient needs out patient colonoscopy for evaluation of rectal thickening. 2.referral to Dr Johney Frame for afib management. 3.Bmet for following up on his renal function. 4.assess percocet need as patient asked for percocet only 2nd day of hospitalization and asked the nurse to just leave it on the table, there is a  concern for diversion. Patient refused UDS test while inpatient. 5. patient needs a PFT test done as the last one was done 7 years ago.  Medication list changes:  Removed medication of MULTAQ 400 MG TABS (DRONEDARONE HCL) Take 1 tablet by mouth two times a day Removed medication of ATENOLOL 25 MG TABS (ATENOLOL) take 1 pill by mouth daily. Removed medication of MEGESTROL ACETATE 400 MG/10ML SUSP (MEGESTROL ACETATE) take 1 teaspoon by by mouth once daily  Discharge medications:  PERCOCET 10-650 MG  TABS (OXYCODONE-ACETAMINOPHEN) take one tab by mouth once every  6  hours as needed for pain WARFARIN SODIUM 5 MG  TABS (WARFARIN SODIUM) Take as directed. CARDIZEM CD 240 MG XR24H-CAP  (DILTIAZEM HCL COATED BEADS) Take 1 capsule by mouth two times a day MIRALAX  POWD (POLYETHYLENE GLYCOL 3350) once a day OMEPRAZOLE 40 MG CPDR (OMEPRAZOLE) Take 1 tablet by mouth once a day SUMATRIPTAN SUCCINATE 50 MG TABS (SUMATRIPTAN SUCCINATE) Take one tablet by mouth once you feel your headache is approaching; may take the second tablet 2 hours later. FLUOXETINE HCL 20 MG CAPS (FLUOXETINE HCL) Take 1 tablet by mouth once a day  Other patient instructions:  Please call clinic [phone] next week to confirm your appointment for hospital follow up.  Call [facility] GI office at  [phone] for a hospital follow up and schedule colonoscopy. Address: [address] [place], [place], [place] [address]  Your multaq, atenolol and megestrol was discontinued.  Note: Hospital Discharge Medications & Other Instructions handout was printed, one copy for patient and a second copy to be placed in hospital chart.

## 2010-06-21 NOTE — Miscellaneous (Signed)
Summary: hospital admission  INTERNAL MEDICINE ADMISSION HISTORY AND PHYSICAL  Patient Name: [name] MRN: [id number] PCP: Dr Blanch Media R1 Dr Tonny Branch [phone] R2 Dr Eben Burow    [phone]  After 5 pm and on weekends: [phone]  PCP: Dr Margarito Liner  CC: Abnormal lab  HPI: Patient is a 65 year old man with a PMH of atrial fibrillation, COPD, Sick sinus syndrome, HTN, depression, and polysubstance abuse who was seen by Dr. Meredith Pel on 05/30/10 for a follow up of his chronic medical conditions and had a Bmet done.  The Bmet showed a creatinine of 2.01, K of 5.6, Na of 146, Cl of 113, BUN of 26.  He has also had a 30 lb weight loss in the last two years.  He has noticed that he has been more tired lately with decreased activity tolerance.  He denies progressive decrease in his urination, leg swelling, itching, chest pain, nausea, or vomiting.  He has also not had any blood in his stool, change in his bowel habits, abdominal pain, or back pain.    ALLERGIES: ! PCN   PAST MEDICAL HISTORY: ATRIAL FIBRILLATION     - s/p hospitalization for RVR: 6/07; 12/08; 2/09; 12/09, 3/10(cardioversion)    - s/p digitalis toxicity 1/09    - ablation aborted in 05/2008 b/c of bilateral femoral occlusion    - TEE followed by DC cardioversion Mar 2010:   ---  Left ventricular function at least moderately depressed.   ---  Aortic valve thickness was mildly increased. There was moderate  aortic valvular regurgitation.   ---  There was mild fixed atheroma of the descending aorta.  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   E coli bacteremia 07/2008- urinary infection and PNA Rx Cipro BILATERAL PLEURAL PLAQUES CLUSTER HEADACHES COPD    - s/p evaluation by Dr. Delford Field ([facility]) 12/2006:  PFT's recommended but pt never f/u    - normal spirometry in 2005 MAJOR DEPRESSION    - s/p voluntary admission at [facility] 09/2002 ERECTILE DYSFUNCTION POLYSUBSTANCE ABUSE    - EtOH - no hx of DTs, no hx of inpt tx    - THC    - tobacco GASTRITIS, RECURRENT    - 2/2 EtOH    - hospitalized 08/2003, 02/2007 SICK SINUS SYNDROME    - s/p perm pacemaker VVI 02/2003 (Dr. 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 Johney Frame) and confirmed by ultrasound DEGENERATIVE DISC DISEASE    - C3-C7    - L3-L4, bilateral pars defect    - L5-S1 spondylolisthesis BILATERAL HIP AVASCULAR NECROSIS    - etiology unknown - noted as far back as 2002    - s/p bilateral hip replacement (see PSH) HYPERTENSION OSTEOARTHRITIS   Left Hydrocele   MEDICATIONS: PERCOCET 10-650 MG  TABS (OXYCODONE-ACETAMINOPHEN) take one tab by mouth once every  6  hours as needed for pain WARFARIN SODIUM 5 MG  TABS (WARFARIN SODIUM)  Take as directed. MULTAQ 400 MG TABS (DRONEDARONE HCL) Take 1 tablet by mouth two times a day CARDIZEM CD 240 MG XR24H-CAP (DILTIAZEM HCL COATED BEADS) Take 1 capsule by mouth two times a day MIRALAX  POWD (POLYETHYLENE GLYCOL 3350) once a day ATENOLOL 25 MG TABS (ATENOLOL) take 1 pill by mouth daily. OMEPRAZOLE 40 MG CPDR (OMEPRAZOLE) Take 1 tablet by mouth once a day SUMATRIPTAN SUCCINATE 50 MG TABS (SUMATRIPTAN SUCCINATE) Take one tablet by mouth once you feel your headache is approaching; may take the second tablet 2 hours later. FLUOXETINE HCL 20 MG CAPS (FLUOXETINE HCL) Take 1 tablet by mouth once a day MEGESTROL ACETATE 400 MG/10ML SUSP (MEGESTROL ACETATE) take 1 teaspoon by by mouth once  daily   SOCIAL HISTORY: SUBSTANCE:     Current Smoker 3/4 ppd x 30+ years    Alcohol use-former heavy use - none since last year  Denies cocaine, crack  Denies current marijuana use.  SOCIAL:    Retired. 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 Very limited Asbestos exposure     10 children that he had with the same woman - she died from cancer in 2002-06-22    Recently lost his lease in [place]. Now renting a room in a house. (07/2008)     Lives with one of his sons.     Poor social support - no one he can ask for help with his meds, etc. (07/26/2008)   FAMILY HISTORY Family History of Alcoholism/Addiction-Father now deceased Family History of Asthma-Mother deceased at age 46 Mom had CAD Father passed of alcohol related problems in his 44s No colon cancer No lung cancer No prostate cancer   ROS: Negative except for as noted in the HPI  VITALS: NONE at the time of admission.   PHYSICAL EXAM: Gen: AOx3, in no acute distress, lying comfortably in the bed. Eyes: PERRL, EOMI ENT:MMM, No erythema noted in posterior pharynx Neck: Supple, No adenopathy, JVD, or stiffness Chest: CTAB with  good respiratory effort CVS: Bradycardic, irregular rhythm NO M/R/G, S1 S2 normal Abdo: soft,ND, BS+x4, Non tender and No hepatosplenomegaly, no bruits EXT: No edema noted, no CVA tenderness Neuro: Non focal, gait is normal, A&O x4 Skin: no rashes noted.   LABS:  Sodium                 [H]  146 mEq/L                   135-145     Result repeated and verified.   Potassium            [H]  5.6 mEq/L                   3.5-5.3     No visible hemolysis.   Chloride             [H]  113 mEq/L                   96-112   CO2                       22 mEq/L                    19-32   Glucose              [  H]  102 mg/dL                   16-10   BUN                  [H]  26 mg/dL                     9-60   Creatinine           [H]  2.01 mg/dL                  0.40-1.50     Result repeated and verified.   Bilirubin, Total          0.4 mg/dL                   4.5-4.0   Alkaline Phosphatase      61 U/L                      39-117   AST/SGOT                  11 U/L                      0-37   ALT/SGPT                  <8 U/L                      0-53   Total Protein             6.6 g/dL                    9.8-1.1   Albumin                   4.5 g/dL                    9.1-4.7   Calcium                   9.8 mg/dL                   8.2-95.6 ! Est GFR, African American                        [L]  40 mL/min                   >60 ! Est GFR, NonAfrican American                        [L]  33 mL/min                   >60    WBC                       8.0 K/uL                    4.0-10.5   RBC                       4.58 MIL/uL                 4.22-5.81   Hemoglobin  14.2 g/dL                   98.1-19.1   Hematocrit                44.3 %                      39.0-52.0   MCV                       96.7 fL                     78.0-100.0 ! MCH                       31.0 pg                     26.0-34.0   MCHC                      32.1 g/dL                   47.8-29.5   RDW                       15.2 %                      11.5-15.5   Platelet Count            201 K/uL                    150-400   Granulocyte %             55 %                        43-77   Absolute Gran             4.4 K/uL                    1.7-7.7   Lymph %                   32 %                        12-46   Absolute Lymph            2.6 K/uL                    0.7-4.0   Mono %                    8 %                         3-12   Absolute Mono             0.7 K/uL                    0.1-1.0   Eos %                     5 %                         0-5   Absolute Eos  0.4 K/uL                    0.0-0.7   Baso %                    0 %                         0-1   Absolute Baso              0.0 K/uL                    0.0-0.1    Sed Rate (ESR)            4 mm/hr                     0-16    TSH                       2.131 uIU/mL                0.350-4.50   Free T4                   1.18 ng/dL                  0.80-1.80    HIV Antibody              NON REACTIVE                NON REACTI  ASSESSMENT AND PLAN: 1. Acute renal failure: Patient is going to be admitted as per Dr Meredith Pel for work up of acute renal failure. We will obtain urine Cr and Na and do a renal ultrasound to figure out prerenal Vs post renal cause. BUN creatinine ratio is 10.  Patient is on Multaq and since one of the more common reactions with it is elevated Cr, we will discontinue it while he is in patient.  We will give him some IV hydration with NS at 100 ml/hr.  2. Afib: Cont on coumadin per pharmacy and cardizem for rate control.  Will need follow up with Dr. Johney Frame his primary cardiologist at discharge.  3. Weight loss:  The weight loss has been 30 lbs over the last 2 years.  He has a history of a thickening of his terminal ileum seen on CT scan of his abdomen in August of 2010 but no follow up of that I can see.  We will repeat the CT scan of his abdomen and pelvis with oral contrast.  Because of his ARF we will avoid IV contrast at this time.  If the study is inadequate we could consider a repeat of the CT scan when his ARF has resolved.   3. COPD: PFT's recommended but never followed up with Dr Delford Field. Cont albuterol as needed.  4. HTN: Continue home medications, cardizem. Hold atenolol for now he was bradycardic the other day in clinic.  5. DEPRESSION/ANXIETY:Continue home medication fluoxetine.  6. VTE PROPH: lovenox   ATTENDING: I performed and/or observed a history and physical examination of the patient.  I discussed the case with the residents as noted and reviewed the residents' notes.  I agree with the findings and plan--please refer to the attending physician note for more  details.  Signature________________________________  Printed Name_____________________________

## 2010-06-21 NOTE — Assessment & Plan Note (Signed)
Summary: HFU-/CFB(JOINES)   Vital Signs:  Patient profile:   65 year old male Height:      72.5 inches (184.15 cm) Weight:      165.7 pounds (75.32 kg) BMI:     22.24 O2 Sat:      97 % on Room air Temp:     97.1 degrees F (36.17 degrees C) oral Pulse rate:   74 / minute BP sitting:   124 / 87  (left arm)  Vitals Entered By: Stanton Kidney Ditzler RN (June 13, 2010 10:45 AM)  O2 Flow:  Room air Is Patient Diabetic? No Pain Assessment Patient in pain? yes     Location: hips and legs Intensity: 6 Type: aching Onset of pain  long time Nutritional Status BMI of < 19 = underweight Nutritional Status Detail appetite down  Have you ever been in a relationship where you felt threatened, hurt or afraid?denies   Does patient need assistance? Functional Status Self care Ambulation Impaired:Risk for fall Comments Using a cane. HFU   Primary Care Provider:  Margarito Liner MD   History of Present Illness: 65 year old man with a PMH of atrial fibrillation, COPD, Sick sinus syndrome, HTN, depression, and polysubstance abuse for hospital follow up  Was discharge on 06/03/10. Was there for ARF and evaluation of wt loss. No  cause for ARF found and reoslved prior to discharge but rectal wall thickining was seen on CT scan which will require colonoscopy  no new complaints today. ask if we can give him something for anxiety and sleeping difficulty. he thinks a lot about his relationships and family issues thorugh out the day and more at night which prevents him from sleeping. Has been on prozac before but he stopped taking it for last few monht without deifinite reasons. no SI/HI     Depression History:      The patient denies a depressed mood most of the day and a diminished interest in his usual daily activities.         Preventive Screening-Counseling & Management  Alcohol-Tobacco     Alcohol drinks/day: 4+     Smoking Status: current     Smoking Cessation Counseling: yes  Packs/Day: 0.5     Year Started: 1959     Passive Smoke Exposure: yes  Caffeine-Diet-Exercise     Does Patient Exercise: yes     Type of exercise: WALKING     Times/week: AT TIMES  Current Medications (verified): 1)  Percocet 10-650 Mg  Tabs (Oxycodone-Acetaminophen) .... Take One Tab By Mouth Once Every  6  Hours As Needed For Pain 2)  Warfarin Sodium 5 Mg  Tabs (Warfarin Sodium) .... Take As Directed. 3)  Cardizem Cd 240 Mg Xr24h-Cap (Diltiazem Hcl Coated Beads) .... Take 1 Capsule By Mouth Two Times A Day 4)  Miralax  Powd (Polyethylene Glycol 3350) .... Once A Day 5)  Omeprazole 40 Mg Cpdr (Omeprazole) .... Take 1 Tablet By Mouth Once A Day 6)  Sumatriptan Succinate 50 Mg Tabs (Sumatriptan Succinate) .... Take One Tablet By Mouth Once You Feel Your Headache Is Approaching; May Take The Second Tablet 2 Hours Later. 7)  Fluoxetine Hcl 20 Mg Caps (Fluoxetine Hcl) .... Take 1 Tablet By Mouth Once A Day  Allergies: 1)  ! Pcn  Social History: Packs/Day:  0.5  Review of Systems  The patient denies anorexia, fever, weight loss, weight gain, vision loss, decreased hearing, hoarseness, chest pain, syncope, dyspnea on exertion, peripheral edema, prolonged cough, headaches,  hemoptysis, abdominal pain, melena, hematochezia, severe indigestion/heartburn, hematuria, incontinence, genital sores, muscle weakness, suspicious skin lesions, transient blindness, difficulty walking, depression, unusual weight change, abnormal bleeding, enlarged lymph nodes, angioedema, breast masses, and testicular masses.    Physical Exam  General:  Gen: VS reveiwed, Alert, well developed, nodistress ENT: mucous membranes pink & moist. No abnormal finds in ear and nose. CVC:S1 S2 , no murmurs, no abnormal heart sounds. Lungs: Clear to auscultation B/L. No wheezes, crackles or other abnormal sounds Abdomen: soft, non distended, no tender. Normal Bowel sounds EXT: no pitting edema, no engorged veins, Pulsations  normal  Neuro:alert, oriented *3, cranial nerved 2-12 intact, strenght normal in all  extremities, senstations normal to light touch.      Impression & Recommendations:  Problem # 1:  Hx of DEPRESSION, SITUATIONAL, ACUTE (ICD-300.4) patient is still feeling sad, anxious, poor appetitie, sleeping difficulty. he has been off prozac for few monht he wishes to restart it for now  plan -restart porzac at previous dose - follow up in 4 weeks, may increase the dose - continue close monitoring  Problem # 2:  RENAL INSUFFICIENCY (ICD-588.9)  resolved prior to discharge will recheck BMET today  Orders: T-Basic Metabolic Panel [phone])  Problem # 3:  ATRIAL FIBRILLATION WITH RAPID VENTRICULAR RESPONSE (ICD-427.31) has appointment with Dr. Johney Frame on 06/20/10  His updated medication list for this problem includes:    Warfarin Sodium 5 Mg Tabs (Warfarin sodium) .[name]... Take as directed.    Cardizem Cd 240 Mg Xr24h-cap (Diltiazem hcl coated beads) .[name]... Take 1 capsule by mouth two times a day  Problem # 4:  WEIGHT LOSS (ICD-783.21)  rectal thickening seen on the CT scan done in the hospital will refer to GI for colonoscpy for further evaluation and biopsy  Orders: Gastroenterology Referral (GI)  Complete Medication List: 1)  Percocet 10-650 Mg Tabs (Oxycodone-acetaminophen) .... Take one tab by mouth once every  6  hours as needed for pain 2)  Warfarin Sodium 5 Mg Tabs (Warfarin sodium) .... Take as directed. 3)  Cardizem Cd 240 Mg Xr24h-cap (Diltiazem hcl coated beads) .... Take 1 capsule by mouth two times a day 4)  Miralax Powd (Polyethylene glycol 3350) .... Once a day 5)  Omeprazole 40 Mg Cpdr (Omeprazole) .... Take 1 tablet by mouth once a day 6)  Sumatriptan Succinate 50 Mg Tabs (Sumatriptan succinate) .... Take one tablet by mouth once you feel your headache is approaching; may take the second tablet 2 hours later. 7)  Fluoxetine Hcl 20 Mg Caps (Fluoxetine hcl) .... Take 1  tablet by mouth once a day  Patient Instructions: 1)  Please schedule a follow-up appointment in 1 month. Prescriptions: FLUOXETINE HCL 20 MG CAPS (FLUOXETINE HCL) Take 1 tablet by mouth once a day  #30 x 1   Entered and Authorized by:   Bethel Born MD   Signed by:   Bethel Born MD on 06/13/2010   Method used:   [facility] to        [facility] [address] [phone]* (retail)       [address]       [place], [place]  [address]       Ph: [phone] or [phone]       Fax: [phone]   RxID:   [id number]    Orders Added: 1)  Gastroenterology Referral [GI] 2)  T-Basic Metabolic Panel [id number] 3)  Est. Patient Level IV [[address]]  Process Orders Check Orders Results:     [facility]: Check successful Tests Sent for requisitioning (June 13, 2010 2:02 PM):     06/13/2010: [facility] -- T-Basic Metabolic Panel [phone] (signed)     Prevention & Chronic Care Immunizations   Influenza vaccine: Fluvax 3+  (01/19/2010)    Tetanus booster: Not documented    Pneumococcal vaccine: Not documented    H. zoster vaccine: Not documented  Colorectal Screening   Hemoccult: Not documented   Hemoccult action/deferral: Ordered  (05/30/2010)    Colonoscopy: Not documented   Colonoscopy action/deferral: GI referral  (01/03/2010)  Other Screening   PSA: 0.36  (08/16/2009)   PSA action/deferral: Discussed-PSA requested  (08/16/2009)   Smoking status: current  (06/13/2010)   Smoking cessation counseling: yes  (06/13/2010)  Lipids   Total Cholesterol: 98  (02/06/2010)   LDL: 55  (02/06/2010)   LDL Direct: Not documented   HDL: 33  (02/06/2010)   Triglycerides: 36  (07/06/2009)  Hypertension   Last Blood Pressure: 124 / 87  (06/13/2010)   Serum creatinine: 2.01  (05/30/2010)   Serum potassium 5.6  (05/30/2010)  Self-Management Support :   Personal Goals (by the next clinic visit) :       Personal blood pressure goal: 140/90  (01/19/2010)   Patient will work on the following items until the next clinic visit to reach self-care goals:     Medications and monitoring: take my medicines every day, bring all of my medications to every visit  (06/13/2010)     Eating: eat more vegetables, use fresh or frozen vegetables, eat fruit for snacks and desserts  (06/13/2010)     Activity: take a 30 minute walk every day  (06/13/2010)     Other: see a smoking cessation counselor  (11/01/2009)    Hypertension self-management support: Written self-care plan, Education handout, Resources for patients handout  (06/13/2010)   Hypertension self-care plan printed.   Hypertension education handout printed      Resource handout printed.

## 2010-06-21 NOTE — Progress Notes (Signed)
Summary: phone/gg  Phone Note Call from Patient   Caller: Patient Summary of Call: Pt stopped by to get his Rx and request a Rx for a "cane".  The one he has is not the correct fit.   Will you write for this?   Initial call taken by: Merrie Roof RN,  May 22, 2010 4:03 PM  Follow-up for Phone Call        Pt will be seen tomorrow in clinic. can get Rx at that time. Follow-up by: Merrie Roof RN,  May 29, 2010 9:31 AM

## 2010-06-21 NOTE — Medication Information (Signed)
Summary: PERCOCET  PERCOCET   Imported By: Margie Billet 05/25/2010 14:38:41  _____________________________________________________________________  External Attachment:    Type:   Image     Comment:   External Document

## 2010-06-21 NOTE — Progress Notes (Signed)
Summary: med refill/gp  Phone Note Refill Request Message from:  Fax from Pharmacy on May 10, 2010 10:10 AM  Refills Requested: Medication #1:  WARFARIN SODIUM 5 MG  TABS Take as directed.   Last Refilled: 03/30/2010 Last Coumadin appt. 12/19.   Method Requested: Electronic Initial call taken by: Chinita Pester RN,  May 10, 2010 10:10 AM  Follow-up for Phone Call        Refilled electronically.  Follow-up by: Margarito Liner MD,  May 10, 2010 10:54 AM    Prescriptions: WARFARIN SODIUM 5 MG  TABS (WARFARIN SODIUM) Take as directed.  #60 x 0   Entered and Authorized by:   Margarito Liner MD   Signed by:   Margarito Liner MD on 05/10/2010   Method used:   [facility]        [facility] [address] [phone]* (retail)       [address]       [place], [place]  [address]       Ph: [phone] or [phone]       Fax: [phone]   RxID:   [id number]

## 2010-06-21 NOTE — Progress Notes (Signed)
Summary: pt calling re being off med/what to do at this point?  Phone Note Call from Patient   Caller: Patient  [phone] Summary of Call: pt was taken off multaq while in hospital due to kidneyy problems, been off since friday, was told to call out office as to what to do at this point Initial call taken by: Glynda Jaeger,  June 07, 2010 10:28 AM  Follow-up for Phone Call        spoke with pt will see DrAllred on 06/20/10 in follow up for a post hospital apt  Pt aware [name], RN, BSN  June 07, 2010 11:54 AM

## 2010-06-22 ENCOUNTER — Telehealth: Payer: Self-pay | Admitting: Internal Medicine

## 2010-06-22 MED ORDER — OXYCODONE-ACETAMINOPHEN 10-650 MG PO TABS: 1.0000 | ORAL_TABLET | Freq: Four times a day (QID) | ORAL | Status: DC | PRN

## 2010-06-22 NOTE — Telephone Encounter (Signed)
Pt was notified and will pick up today

## 2010-06-22 NOTE — Telephone Encounter (Signed)
Refill approved - nurse to complete. 

## 2010-06-22 NOTE — Medication Information (Signed)
Summary: PERCOCET  PERCOCET   Imported By: Margie Billet 09/25/2009 10:33:04  _____________________________________________________________________  External Attachment:    Type:   Image     Comment:   External Document

## 2010-06-27 NOTE — Assessment & Plan Note (Signed)
Summary: EPH/PT TAKEN OFF MALTAQ/LG/NR   Vital Signs:  Patient profile:   65 year old male Height:      72.5 inches Weight:      165 pounds BMI:     22.15 Pulse rate:   71 / minute Resp:     16 per minute BP sitting:   120 / 71  (right arm)  Vitals Entered By: Marrion Coy, CNA (June 20, 2010 10:25 AM)  Primary Provider:  Margarito Liner MD   History of Present Illness: The patient presents today for routine electrophysiology followup.  He has been recently felt to have rectal cancer.  His workup for this is ongoing.  He was also recently hospitalized for renal failure.  His Multaq was stopped during that hospitalization.  Fortunately, he has not returned to afib or atrial flutter.  The patient denies symptoms of palpitations, chest pain, shortness of breath, orthopnea, PND, lower extremity edema, dizziness, presyncope, syncope, or neurologic sequela. The patient is tolerating medications without difficulties and is otherwise without complaint today.   Current Medications (verified): 1)  Percocet 10-650 Mg  Tabs (Oxycodone-Acetaminophen) .... Take One Tab By Mouth Once Every  6  Hours As Needed For Pain 2)  Warfarin Sodium 5 Mg  Tabs (Warfarin Sodium) .... Take As Directed. 3)  Cardizem Cd 240 Mg Xr24h-Cap (Diltiazem Hcl Coated Beads) .... Take 1 Capsule By Mouth Two Times A Day 4)  Miralax  Powd (Polyethylene Glycol 3350) .... Once A Day 5)  Omeprazole 40 Mg Cpdr (Omeprazole) .... Take 1 Tablet By Mouth Once A Day 6)  Sumatriptan Succinate 50 Mg Tabs (Sumatriptan Succinate) .... Take One Tablet By Mouth Once You Feel Your Headache Is Approaching; May Take The Second Tablet 2 Hours Later. 7)  Fluoxetine Hcl 20 Mg Caps (Fluoxetine Hcl) .... Take 1 Tablet By Mouth Once A Day  Allergies (verified): 1)  ! Pcn  Past History:  Past Medical History: Reviewed history from 09/25/2008 and no changes required. ATRIAL FIBRILLATION     - s/p hospitalization for RVR: 6/07; 12/08; 2/09;  12/09, 3/10(cardioversion)    - s/p digitalis toxicity 1/09    - ablation aborted in 05/2008 b/c of bilateral femoral occlusion    - TEE followed by DC cardioversion Mar 2010:   ---  Left ventricular function at least moderately depressed.   ---  Aortic valve thickness was mildly increased. There was moderate  aortic valvular regurgitation.   ---  There was mild fixed atheroma of the descending aorta.  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   E coli bacteremia 07/2008- urinary infection and PNA Rx Cipro BILATERAL PLEURAL PLAQUES CLUSTER HEADACHES COPD    - s/p evaluation by Dr. Delford Field ([facility]) 12/2006: PFT's recommended but pt never f/u    - normal spirometry in 2005 MAJOR DEPRESSION    - s/p voluntary admission at [facility] 09/2002 ERECTILE DYSFUNCTION POLYSUBSTANCE ABUSE    - EtOH - no hx of DTs, no hx of inpt tx    - THC    - tobacco GASTRITIS, RECURRENT    - 2/2 EtOH    - hospitalized 08/2003, 02/2007 SICK SINUS SYNDROME    - s/p perm pacemaker VVI 02/2003 (Dr. 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 Johney Frame) and confirmed by ultrasound DEGENERATIVE DISC DISEASE    - C3-C7    - L3-L4, bilateral pars defect    - L5-S1 spondylolisthesis BILATERAL HIP AVASCULAR NECROSIS    - etiology unknown - noted as far back as 2002    - s/p bilateral hip replacement (see PSH) HYPERTENSION OSTEOARTHRITIS   Left Hydrocele  Past Surgical History: Reviewed history from  09/09/2008 and no changes required. Single Chamber Permanent pacemaker-Oct 04 Left hip bipolar hemiarthroplasty-22Apr04 for AVN of Left femoral head- Dr.Phillips Montez Morita  had interosseous ganglion cyst in hip R total hip arthroplasty 02/2008 (Dr. Renae Fickle) for AVN of R femoral head Percutaneous transluminal coronary angiography-Oct 03-Nml Coronaries, Dr. Algie Coffer,     neg Persantine Myoview in Dec 06 s/p R wrist cyst removal  Social History: Reviewed history from 11/01/2009 and no changes required. SUBSTANCE:     Current Smoker 3/4 ppd x 30+ years    Alcohol use-former heavy use - none since last year  Denies cocaine, crack  Denies current marijuana use.  SOCIAL:    Retired. 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 Very limited Asbestos exposure     10 children that he had with the same woman - she died from cancer in 2002-06-16    Recently lost his lease in [place] [place]. Now renting a room in a house. (07/2008)     Lives with one of his sons.     Poor social support - no one he can ask for help with his meds, etc. (07/26/2008)  Review of Systems       All systems are reviewed and negative except as listed in the HPI.   Physical Exam  General:  NAD Head:  Normocephalic and atraumatic without obvious abnormalities.  Eyes:  PERRLA/EOM intact; conjunctiva and lids normal. Mouth:  Teeth, gums and palate normal. Oral mucosa normal. Neck:  supple Chest Wall:  R sided pacemaker pocket is well healed Lungs:  Clear bilaterally to auscultation and percussion. Heart:  RRR, no m/r/g Abdomen:  Bowel sounds positive; abdomen soft and non-tender without masses, organomegaly, or hernias noted. No hepatosplenomegaly. Msk:  walks with a cande Extremities:  no edema Neurologic:  alert & oriented X3, cranial nerves II-XII intact, strength normal in all extremities, and sensation intact to light  touch.     Impression & Recommendations:  Problem # 1:  ATRIAL FIBRILLATION (ICD-427.31) The patient has both afib and atrial flutter.  These have been very difficult to control in the past.  Amiodarone did not control his arrhythmia, however he has maintained sinus for some time with multaq.  Unfortunately, his multaq was recently discontinued due to renal failure.  What we know is that multaq will artificially elevated creatinine without affecting renal function.  I would therefore like to get him back on multaq if able given his robust improvements in the past with significant reduction in hospitalizations on this medicine. We will check LFTs and creatinine today.  IF stable, we will restart multaq 400mg  two times a day and have him repeat  a BMET in 4 weeks. HE will need lifelong coumadin.  Problem # 2:  BRADYCARDIA (ICD-427.89) normal pacemaker function as above  EKG today shows sinus rhythm at 72 bpm PR 146, QRS 86, Qtc 448  Problem # 3:  TOBACCO ABUSE (ICD-305.1) cessation advised  Problem # 4:  HYPERTENSION (ICD-401.9) stable  Problem # 5:  CHRONIC SYSTOLIC HEART FAILURE (ICD-428.22) EF only mildly depressed by echo 9/11 (45%) and improved with rate control NYHA Class II chronically  Other Orders: TLB-BMP (Basic Metabolic Panel-BMET) (80048-METABOL) TLB-Hepatic/Liver Function Pnl (80076-HEPATIC)  Patient Instructions: 1)  Your physician recommends that you schedule a follow-up appointment in: 6 months with Pacer clinic 2)  Your physician recommends that you continue on your current medications as directed. Please refer to the Current Medication list given to you today.   Orders Added: 1)  TLB-BMP (Basic Metabolic Panel-BMET) [80048-METABOL] 2)  TLB-Hepatic/Liver Function Pnl [80076-HEPATIC]      PPM Specifications Following MD:  Hillis Range, MD     PPM Vendor:  [employer]     PPM Model Number:  [id number]     PPM Serial Number:  [id number] PPM DOI:  06/17/2008     PPM  Implanting MD:  Hillis Range, MD  Lead 1    Location: RV     DOI: 06/17/2008     Model #: [id number]     Serial #: [id number]     Status: active  Magnet Response Rate:  BOL 98.6 ERI  86.3  Indications:  A-flutter/A-fib   PPM Follow Up Remote Check?  No Battery Voltage:  2.80 V     Battery Est. Longevity:  10 years     Pacer Dependent:  No     Right Ventricle  Amplitude: 5.5 mV, Impedance: 400 ohms, Threshold: 1.0 V at 0.5 msec  Episodes Coumadin:  Yes Ventricular Pacing:  18%  Parameters Mode:  VVI     Lower Rate Limit:  50     Next Cardiology Appt Due:  12/19/2010 Tech Comments:  No parameter changes.  A-fib with controlled ventricular response, +coumadin.  ROV 6 months clinic. Altha Harm, LPN  June 20, 2010 11:09 AM

## 2010-06-27 NOTE — Progress Notes (Signed)
Summary: hold coumadin 5 days prior -surgery   Phone Note From Other Clinic   CallerEfraim Kaufmann office [phone] fax [phone] Request: Talk with Nurse Summary of Call: pt need to hold his coumdain  5 day prior - due to surgery on 2/15.  Initial call taken by: Lorne Skeens,  June 22, 2010 8:53 AM  Follow-up for Phone Call        [name] with [name] pt is having a colonoscopy and endoscopy with anesth at [facility] on 07/04/10.  Abnormal CT-- per DrAllred hold for 3 days and restart the same day  Melissa aware and will discuss with Dr Bosie Clos and let me know if there is a problem [name], RN, BSN  June 22, 2010 10:25 AM

## 2010-07-04 ENCOUNTER — Ambulatory Visit (HOSPITAL_COMMUNITY)
Admission: RE | Admit: 2010-07-04 | Discharge: 2010-07-04 | Disposition: A | Discharge status: Home / Self Care | Payer: Medicare Other | Source: Ambulatory Visit | Attending: Gastroenterology | Admitting: Gastroenterology

## 2010-07-04 ENCOUNTER — Emergency Department (HOSPITAL_COMMUNITY)
Admission: EM | Admit: 2010-07-04 | Discharge: 2010-07-04 | Disposition: A | Discharge status: Home / Self Care | Payer: Medicare Other | Source: Home / Self Care | Attending: Emergency Medicine | Admitting: Emergency Medicine

## 2010-07-04 DIAGNOSIS — Z538 Procedure and treatment not carried out for other reasons: Secondary | ICD-10-CM | POA: Insufficient documentation

## 2010-07-04 DIAGNOSIS — Z7901 Long term (current) use of anticoagulants: Secondary | ICD-10-CM | POA: Insufficient documentation

## 2010-07-04 DIAGNOSIS — Z01812 Encounter for preprocedural laboratory examination: Secondary | ICD-10-CM | POA: Insufficient documentation

## 2010-07-04 DIAGNOSIS — R634 Abnormal weight loss: Secondary | ICD-10-CM | POA: Insufficient documentation

## 2010-07-04 DIAGNOSIS — I10 Essential (primary) hypertension: Secondary | ICD-10-CM | POA: Insufficient documentation

## 2010-07-04 DIAGNOSIS — K6289 Other specified diseases of anus and rectum: Secondary | ICD-10-CM | POA: Insufficient documentation

## 2010-07-04 DIAGNOSIS — Z9981 Dependence on supplemental oxygen: Secondary | ICD-10-CM | POA: Insufficient documentation

## 2010-07-04 DIAGNOSIS — J4489 Other specified chronic obstructive pulmonary disease: Secondary | ICD-10-CM | POA: Insufficient documentation

## 2010-07-04 DIAGNOSIS — I509 Heart failure, unspecified: Secondary | ICD-10-CM | POA: Insufficient documentation

## 2010-07-04 DIAGNOSIS — Z950 Presence of cardiac pacemaker: Secondary | ICD-10-CM | POA: Insufficient documentation

## 2010-07-04 DIAGNOSIS — R002 Palpitations: Secondary | ICD-10-CM | POA: Insufficient documentation

## 2010-07-04 DIAGNOSIS — I4891 Unspecified atrial fibrillation: Secondary | ICD-10-CM | POA: Insufficient documentation

## 2010-07-04 DIAGNOSIS — J449 Chronic obstructive pulmonary disease, unspecified: Secondary | ICD-10-CM | POA: Insufficient documentation

## 2010-07-04 LAB — DIFFERENTIAL
Basophils Relative: 0 % (ref 0–1)
Lymphs Abs: 1.5 10*3/uL (ref 0.7–4.0)
Monocytes Relative: 9 % (ref 3–12)
Neutro Abs: 2.4 10*3/uL (ref 1.7–7.7)
Neutrophils Relative %: 54 % (ref 43–77)

## 2010-07-04 LAB — CBC
Hemoglobin: 14.9 g/dL (ref 13.0–17.0)
MCH: 32.3 pg (ref 26.0–34.0)
RBC: 4.61 MIL/uL (ref 4.22–5.81)
WBC: 4.5 10*3/uL (ref 4.0–10.5)

## 2010-07-04 LAB — COMPREHENSIVE METABOLIC PANEL
ALT: <8 U/L (ref 0–53)
AST: 16 U/L (ref 0–37)
Albumin: 3.5 g/dL (ref 3.5–5.2)
Alkaline Phosphatase: 57 U/L (ref 39–117)
Calcium: 9.1 mg/dL (ref 8.4–10.5)
GFR calc Af Amer: 47 mL/min — ABNORMAL LOW (ref >60)
Glucose, Bld: 96 mg/dL (ref 70–99)
Potassium: 4.4 mEq/L (ref 3.5–5.1)
Sodium: 144 mEq/L (ref 135–145)
Total Protein: 5.9 g/dL — ABNORMAL LOW (ref 6.0–8.3)

## 2010-07-04 LAB — PROTIME-INR
INR: 1.5 — ABNORMAL HIGH (ref 0.00–1.49)
INR: 1.65 — ABNORMAL HIGH (ref 0.00–1.49)
Prothrombin Time: 19.7 seconds — ABNORMAL HIGH (ref 11.6–15.2)

## 2010-07-04 LAB — TROPONIN I: Troponin I: 0.02 ng/mL (ref 0.00–0.06)

## 2010-07-04 NOTE — Discharge Summary (Signed)
NAMEKESHAWN, FIORITO NO.:  [id number]  MEDICAL RECORD NO.:  [id number]          PATIENT TYPE:  INP  LOCATION:  6705                         FACILITY:  [facility]  PHYSICIAN:  Tilford Pillar, MD     DATE OF BIRTH:  09-19-1945  DATE OF ADMISSION:  06/01/2010 DATE OF DISCHARGE:  06/03/2010                              DISCHARGE SUMMARY   DISCHARGE DIAGNOSES: 1. Acute renal failure. 2. Rectal thickening. 3. Weight loss. 4. Chronic obstructive pulmonary disease. 5. Atrial fibrillation. 6. Hypertension. 7. Depression. 8. Lumbar osteoarthritis.  DISCHARGE MEDICATIONS: 1. Percocet 10/650 to take 1 tablet by mouth every 6 hours as needed     for pain. 2. Coumadin 5 mg tablets to take as directed. 3. Cardizem 240 mg XR 24-hour caplet to take 1 capsule by mouth twice     daily. 4. MiraLax to take once daily. 5. Omeprazole 40 mg to take 1 tablet by mouth daily. 6. Sumatriptan 50 mg to take 1 tablet by mouth when you feel your     headache approaching.  May take a second tablet 2 hours later. 7. Fluoxetine 20 mg caplets to take 1 caplet by mouth daily.  DISPOSITION/FOLLOWUP:  Mr. Veras was discharged from [facility] in stable condition.  He was discharged home and will be followed up by Dr. Scot Dock on June 13, 2010 at 10:30 in the morning and with Dr. Johney Frame, his cardiologist on June 20, 2010 at 11:45 a.m. At his followup with Dr. Scot Dock, he should have a BMET done to follow his renal function.  He should also be referred for colonoscopy and PFTs.  He should stop taking his Multaq.  During his stay, he had been off his Percocet which he is chronically on for 24 hours before he finally asked for it.  When he was given it by Nursing, Nursing stated he asked to have it placed on the table next day from where he could take it when he wanted.  Nursing also reported that he had a bag full of pills that he would not let them go through.  There was  some concern for diversion of medications, so his need for Percocet 10/650 should also be evaluated.  He refused a UDS during his stay to ensure he was actually taking his medicines as prescribed.  Consultations were GI.  Procedures performed were a chest x-ray which showed: 1. COPD, emphysema, concomitant scarring involving the left lower lobe     and lingula with a mild pleural parenchymal scarring at the left     apex and the deep right lower lobe.  No acute cardiopulmonary     disease.     Stable cardiomegaly without evidence of pulmonary edema.     Stable extensive calcified pleural plaques involving the left     hemithorax consistent with asbestos-related pleural disease.  1. CT abdomen/pelvis impression showed probable rectal fecal impaction, marked circumferential thickening of the wall of the distal rectum just above the missed above the anal verge; question proctitis versus rectal malignancy.  Please correlate with physical exam.  Also possible chronic distal appendicitis,  dilatation of the distal appendix up to 9 mL without periumbilical inflammation which is new since August 2010 and no acute or significant abnormalities otherwise, specifically no evidence of thickening of the small bowel as was given in the clinical history. He also had a renal ultrasound which showed age-related renal cortical atrophy but no acute abnormalities.  ADMISSION HISTORY OF PRESENT ILLNESS:  The patient is a 65 year old man with a past medical history of atrial fibrillation, COPD, sick sinus syndrome, hypertension, depression, polysubstance abuse who was seen by Dr. Meredith Pel on May 30, 2010 for followup of his chronic medical conditions and a BMET done.  BMET showed a creatinine of 2.01, K of 5.6, sodium of 146, chloride of 113, and a BUN of 26.  He had also had a 30- pound weight loss in the last 2 years.  He has noticed that he has been more tired lately with decreased activity tolerance.   He denies any progressive decrease in his urination, leg swelling, itching, chest pain, nausea, or vomiting.  He has also not had any blood in his stool, changes in his bowel habits, abdominal pain, or back pain.  ADMISSION PHYSICAL EXAMINATION:  GENERAL:  Alert and oriented x3, in no acute distress.  Lying comfortably in the bed. EYES:  Pupils were equal, round, reactive to light.  Extraocular movements intact. ENT:  His mucous membranes were moist.  No erythema was noted in the posterior pharynx. NECK:  Supple with no adenopathy, JVD, or stiffness. CHEST:  Clear to auscultation bilaterally with good respiratory effort. CARDIOVASCULAR:  Bradycardic, irregular rhythm.  No murmurs, rubs, or gallops.  S1, S2 were normal. ABDOMEN:  Soft, nontender.  Bowel sounds positive.  No hepatosplenomegaly or bruits. EXTREMITIES:  Showed no edema or CVA tenderness or cyanosis. NEUROLOGICAL:  Nonfocal.  Gait was normal.  Alert and oriented x3. SKIN:  Had no rashes.  ADMISSION LABORATORIES:  Sodium was 146, potassium was 5.6, chloride was 113, bicarb was 22, glucose was 102, BUN was 26, creatinine was 2.01, bilirubin was 0.4, alk phos 61, AST 11, ALT less than 8, total protein 6.6, albumin was 4.5, calcium was 9.8, white count was 8.0, ANC was 4.4, hemoglobin was 14.2, hematocrit was 44.3, MCV was 96.7, platelets were 211, sed rate was 4 mm per hour, TSH was 2.131, free T4 was 1.18, and HIV antibody was nonreactive.  HOSPITAL COURSE BY PROBLEM: 1. Acute renal failure.  He was asked by Dr. Meredith Pel to come to the     hospital for admission to evaluate his kidney function.  He had     been seen in the clinic and a BMET was done which showed his     creatinine of 2.01 which is above his baseline of 1.6-1.8.  During     his hospitalization, he underwent a CT of the abdomen and pelvis as     well as a renal ultrasound which showed that the kidneys were     normal.  He is on Multaq which can cause an  increase in creatinine,     so this was held during his stay.  His creatinine fell to 1.49 on     discharge.  He was told to follow up with Dr. Johney Frame since we     stopped his Multaq which he was on for AFib. 2. Rectal thickening.  He had a finding of thickened ileum on a CT     scan from August 2010, which had not been followed, but upon  his     self-report of a 30-pound weight loss over the last year and a half     and a decreased appetite, a CT of the abdomen and pelvis done     showed a resolution of the ileal thickening, but the thickened     rectum that was commented on to be suspicious for rectal malignancy     versus proctitis.  He has not had a screening colonoscopy.  GI was     consulted during this stay and was going to scope him during his     stay, but then he was on Coumadin, so they wanted to scope him     again.  He became agitated, did not want to wait to get his     colonoscopy done before discharge.  He was discharged and told to     follow up with the GI to schedule his colonoscopy as an outpatient. 3. COPD.  He has a past medical history of COPD with no PFTs     documented before 2008.  He was told that he should have followup     PFTs and has not done that yet.  He was supposed to have them while     hospitalized, but demanded to leave before they could be done, so     he will need to schedule them as an outpatient. 4. AFib.  His Multaq was stopped because of his acute renal failure.     He was maintained on Coumadin and Cardizem throughout his stay.     His atenolol was held on discharge because of his symptomatic     bradycardia and dizziness that he had while in the hospital.  He     will follow up with Dr. Johney Frame, his cardiologist, for this and     should consider restarting his atenolol if he is tachycardic or he     is able to tolerate it.  He should probably not be restarted on     Multaq because of the bump in his creatinine and a decrease in his     renal  function. 5. Hypertension.  His home medications were started while in the     hospital.  During his hospitalization, he became hypotensive and     symptomatic so the atenolol was stopped and his BP normalized.  He     was discharged on his Cardizem only. 6. Lumbar osteoarthritis.  He was maintained on his home dose of     Percocet during his admission.  He did not ask for it, however, 24     hours after his admission, even though he takes a q.6 at home for     his pain.  When he did ask for it, Nursing reported he wanted to     leave it at the bedside table for him to take whenever he feels     like he needs it.  Nursing also reported he had a bag of     medications and he would not let them look through at his bedside     throughout admission.  His behavior is suspicious for diversion, so     we asked him if he would be okay during the stay in the hospital     and he refused he should have the need for his long-term high-dose     Percocet reevaluated at his followup visits.  DISCHARGE VITAL SIGNS:  Temperature is 98.9, pulse was 50, blood  pressure was 104/63, respirations were 18, saturating 97% on room air.  There were no discharge laboratories done.    ______________________________ Leodis Sias, MD   ______________________________ Tilford Pillar, MD    CP/MEDQ  D:  06/10/2010  T:  06/11/2010  Job:  [id number]  cc:   Ileana Roup, M.D.  Electronically Signed by Leodis Sias MD on 06/11/2010 07:37:01 AM Electronically Signed by Blanch Media M.D. on 07/04/2010 09:32:37 AM

## 2010-07-05 ENCOUNTER — Telehealth (INDEPENDENT_AMBULATORY_CARE_PROVIDER_SITE_OTHER): Payer: Self-pay | Admitting: *Deleted

## 2010-07-05 LAB — GLUCOSE, CAPILLARY: Glucose-Capillary: 81 mg/dL (ref 70–99)

## 2010-07-05 NOTE — Cardiovascular Report (Signed)
Summary: Office Visit   Office Visit   Imported By: Roderic Ovens 06/29/2010 11:52:05  _____________________________________________________________________  External Attachment:    Type:   Image     Comment:   External Document

## 2010-07-11 NOTE — Progress Notes (Signed)
  Phone Note Call from Patient Call back at [facility] Phone [phone]   Caller: Patient Reason for Call: Talk to Nurse Initial call taken by: Roe Coombs,  July 05, 2010 2:10 PM  Follow-up for Phone Call        pt states he is taking the [facility] and will let us know when they resch his procedure  [name], RN, BSN  July 05, 2010 3:12 PM

## 2010-07-16 ENCOUNTER — Telehealth: Payer: Self-pay | Admitting: Internal Medicine

## 2010-07-18 ENCOUNTER — Encounter: Payer: Self-pay | Admitting: Internal Medicine

## 2010-07-18 ENCOUNTER — Ambulatory Visit (INDEPENDENT_AMBULATORY_CARE_PROVIDER_SITE_OTHER): Payer: Medicare Other | Admitting: Internal Medicine

## 2010-07-18 DIAGNOSIS — F17200 Nicotine dependence, unspecified, uncomplicated: Secondary | ICD-10-CM

## 2010-07-18 DIAGNOSIS — R634 Abnormal weight loss: Secondary | ICD-10-CM

## 2010-07-18 DIAGNOSIS — K219 Gastro-esophageal reflux disease without esophagitis: Secondary | ICD-10-CM

## 2010-07-18 DIAGNOSIS — I4891 Unspecified atrial fibrillation: Secondary | ICD-10-CM

## 2010-07-18 DIAGNOSIS — G8929 Other chronic pain: Secondary | ICD-10-CM

## 2010-07-18 DIAGNOSIS — I10 Essential (primary) hypertension: Secondary | ICD-10-CM

## 2010-07-18 DIAGNOSIS — N259 Disorder resulting from impaired renal tubular function, unspecified: Secondary | ICD-10-CM

## 2010-07-18 DIAGNOSIS — Z7901 Long term (current) use of anticoagulants: Secondary | ICD-10-CM

## 2010-07-18 DIAGNOSIS — F329 Major depressive disorder, single episode, unspecified: Secondary | ICD-10-CM

## 2010-07-18 LAB — COMPREHENSIVE METABOLIC PANEL
AST: 17 U/L (ref 0–37)
Alkaline Phosphatase: 74 U/L (ref 39–117)
BUN: 21 mg/dL (ref 6–23)
Glucose, Bld: 80 mg/dL (ref 70–99)
Sodium: 143 mEq/L (ref 135–145)
Total Bilirubin: 0.6 mg/dL (ref 0.3–1.2)
Total Protein: 7.3 g/dL (ref 6.0–8.3)

## 2010-07-18 MED ORDER — OXYCODONE-ACETAMINOPHEN 10-650 MG PO TABS: 1.0000 | ORAL_TABLET | Freq: Four times a day (QID) | ORAL | Status: DC | PRN

## 2010-07-18 NOTE — Assessment & Plan Note (Signed)
I discussed the importance of smoking cessation with patient, and offered referral to our smoking cessation counselor but he declined.

## 2010-07-18 NOTE — Assessment & Plan Note (Signed)
Patient's INR today was subtherapeutic, but he was off of his warfarin previously in anticipation of his colonoscopy and says that he started back a few days ago. I  advised him to follow up with Dr. Alexandria Lodge in the anticoagulation clinic within one week.

## 2010-07-18 NOTE — Assessment & Plan Note (Signed)
Patient has chronic bilateral hip pain and low back pain. He reports that his pain is adequately controlled on his current dose of Percocet. I gave a prescription for refill today.

## 2010-07-18 NOTE — Patient Instructions (Signed)
Please schedule an appointment with Dr. Alexandria Lodge in the anticoagulation clinic within 2 weeks. Please follow up with Dr. Bosie Clos as planned for colonoscopy. Please follow up with Dr. Johney Frame as planned.

## 2010-07-18 NOTE — Assessment & Plan Note (Signed)
Patient is now back on Multaq prescribed by Dr. Johney Frame.   By exam, he appears to be in normal sinus rhythm today (his rhythm is regular).  His tachycardia when he presented for colonoscopy earlier this month was apparently provoked by not taking his medications that morning. He will follow up with Dr. Johney Frame as scheduled.

## 2010-07-18 NOTE — Progress Notes (Signed)
  Subjective:    Patient ID: [name], male    DOB: 09-Jun-1945, 65 y.o.   MRN: [id number]  HPI Patient returns for follow up of his chronic atrial fibrillation, weight loss, renal insufficiency, chronic pain, and other medical problems.  His chief complaint today is bilateral hip pain and low back pain which are chronic, and loss of appetite.  He reports that his pain is reasonably well-controlled on Percocet.  He was scheduled for a colonoscopy to be done by Dr. Bosie Clos on February 15, but says that he did not take his medications that morning and that when he presented for the procedure that his heart rate was extremely fast and the procedure was canceled. He reports that Dr. Bosie Clos plans to reschedule the colonoscopy.  He did not bring his medications to clinic today, but reviewed his medication list from memory.   Review of Systems  Constitutional: Positive for appetite change (Loss of appetite.). Negative for fever, chills and diaphoresis.  Respiratory: Positive for shortness of breath (Chronic with exertion, occasionally at rest.).   Cardiovascular: Negative for chest pain, palpitations and leg swelling.  Gastrointestinal: Negative for nausea, vomiting, abdominal pain, blood in stool, anal bleeding and rectal pain.  Musculoskeletal: Positive for back pain and arthralgias (Bilateral hip pain.).  Neurological: Negative for headaches.  Psychiatric/Behavioral: Negative for suicidal ideas.       Objective:   Physical Exam  Constitutional: No distress.  Cardiovascular: Normal rate, regular rhythm and normal heart sounds.   Pulmonary/Chest: Effort normal and breath sounds normal. No respiratory distress. He has no wheezes. He has no rales.  Abdominal: Soft. Bowel sounds are normal. He exhibits no distension and no mass. There is no tenderness. There is no rebound and no guarding.          Assessment & Plan:

## 2010-07-18 NOTE — Assessment & Plan Note (Signed)
Patient's weight has been stable since his last visit. He is undergoing a lower GI workup by Dr. Bosie Clos because of thickening of the rectum seen on a CT scan that was concerning for malignancy. I advised him to follow up with Dr. Bosie Clos as scheduled.  For now I advised that we hold off on further Megace pending the results of that workup.

## 2010-07-18 NOTE — Assessment & Plan Note (Signed)
Patient reports that he is doing well on his current dose of Prozac. Will continue at current dose.

## 2010-07-18 NOTE — Assessment & Plan Note (Signed)
Assessment: Hypertension control:  controlled  Progress toward goals:  at goal Barriers to meeting goals:  no barriers identified  Plan: Hypertension treatment:  continue current medications   BP Readings from Last 3 Encounters:  07/18/10 131/78  06/20/10 120/71  06/13/10 124/87   Lab Results  Component Value Date   NA 144 07/04/2010   K 4.4 07/04/2010   CL 110 07/04/2010   CO2 27 07/04/2010   BUN 17 07/04/2010   CREATININE 1.76* 07/04/2010

## 2010-07-26 NOTE — Progress Notes (Signed)
Summary: hold coumadin 3 day prior  Phone Note From Other Clinic   Caller: [facility] office [phone]- fax [phone]. Request: Talk with Nurse Summary of Call: can pt hold couamdin 3 day prior to procedure.  Initial call taken by: Lorne Skeens,  July 16, 2010 2:49 PM  Follow-up for Phone Call        Spoke with Melisa at [facility]. She would like to know if pt. hold coumodin for 3 days prior to procedure. Pt. needs to have a Flexi Sigmoidoscopy and Biopsy. Procedure will be scheduled once they know the coumodin can be held.   According to Melisa,prior procedures colonocopy and endoscopy was canceled due to pt. had not taken his regular medications the morning of the procedure, and his pulse was too high. Follow-up by: Ollen Gross, RN, BSN,  July 16, 2010 3:23 PM  Additional Follow-up for Phone Call Additional follow up Details #1::        We gave the okay to hold for 3 days prior the last time.  Still same recomendations? [name], RN, BSN  July 16, 2010 6:10 PM per DrAllred okay to hold 3 days prior and restart the day of procedure.  pt aware [name], RN, BSN  July 18, 2010 12:13 PM

## 2010-07-27 ENCOUNTER — Telehealth: Payer: Self-pay | Admitting: *Deleted

## 2010-07-27 NOTE — Telephone Encounter (Signed)
Call for Prior Authorization on Omeprazole 40 mg.  Denied pt will need to have Omeprazole 20 mg take 2 Capsules daily.

## 2010-08-02 ENCOUNTER — Telehealth: Payer: Self-pay | Admitting: *Deleted

## 2010-08-02 LAB — LIPID PANEL
Cholesterol: 98 mg/dL (ref 0–200)
LDL Cholesterol: 55 mg/dL (ref 0–99)
VLDL: 10 mg/dL (ref 0–40)

## 2010-08-02 LAB — RAPID URINE DRUG SCREEN, HOSP PERFORMED
Amphetamines: NOT DETECTED
Opiates: POSITIVE — AB
Tetrahydrocannabinol: POSITIVE — AB

## 2010-08-02 LAB — CBC
HCT: 52.6 % — ABNORMAL HIGH (ref 39.0–52.0)
MCH: 31.7 pg (ref 26.0–34.0)
MCHC: 35.2 g/dL (ref 30.0–36.0)
MCV: 91.5 fL (ref 78.0–100.0)
Platelets: 138 10*3/uL — ABNORMAL LOW (ref 150–400)
RBC: 5.75 MIL/uL (ref 4.22–5.81)
RDW: 14 % (ref 11.5–15.5)
WBC: 5.5 10*3/uL (ref 4.0–10.5)
WBC: 7.5 10*3/uL (ref 4.0–10.5)

## 2010-08-02 LAB — URINE CULTURE: Culture  Setup Time: 201109201000

## 2010-08-02 LAB — DIFFERENTIAL
Eosinophils Relative: 3 % (ref 0–5)
Lymphs Abs: 2.7 10*3/uL (ref 0.7–4.0)
Monocytes Relative: 13 % — ABNORMAL HIGH (ref 3–12)
Neutro Abs: 3.6 10*3/uL (ref 1.7–7.7)

## 2010-08-02 LAB — CK TOTAL AND CKMB (NOT AT ARMC)
Relative Index: INVALID (ref 0.0–2.5)
Total CK: 70 U/L (ref 7–232)

## 2010-08-02 LAB — URINALYSIS, ROUTINE W REFLEX MICROSCOPIC
Hgb urine dipstick: NEGATIVE
Specific Gravity, Urine: 1.024 (ref 1.005–1.030)
Urobilinogen, UA: 1 mg/dL (ref 0.0–1.0)

## 2010-08-02 LAB — POCT CARDIAC MARKERS
CKMB, poc: <1 ng/mL — ABNORMAL LOW (ref 1.0–8.0)
Myoglobin, poc: 99.4 ng/mL (ref 12–200)
Troponin i, poc: <0.05 ng/mL (ref 0.00–0.09)

## 2010-08-02 LAB — POCT I-STAT, CHEM 8
BUN: 32 mg/dL — ABNORMAL HIGH (ref 6–23)
Creatinine, Ser: 1.7 mg/dL — ABNORMAL HIGH (ref 0.4–1.5)
Potassium: 4.6 mEq/L (ref 3.5–5.1)
Sodium: 138 mEq/L (ref 135–145)
TCO2: 21 mmol/L (ref 0–100)

## 2010-08-02 LAB — CARDIAC PANEL(CRET KIN+CKTOT+MB+TROPI)
CK, MB: 1.1 ng/mL (ref 0.3–4.0)
CK, MB: 1.7 ng/mL (ref 0.3–4.0)
Relative Index: INVALID (ref 0.0–2.5)
Relative Index: INVALID (ref 0.0–2.5)
Total CK: 50 U/L (ref 7–232)
Troponin I: 0.01 ng/mL (ref 0.00–0.06)
Troponin I: 0.03 ng/mL (ref 0.00–0.06)

## 2010-08-02 LAB — URINE MICROSCOPIC-ADD ON

## 2010-08-02 LAB — CREATININE, URINE, RANDOM: Creatinine, Urine: 258.1 mg/dL

## 2010-08-02 LAB — PROTIME-INR: INR: 1.47 (ref 0.00–1.49)

## 2010-08-02 LAB — COMPREHENSIVE METABOLIC PANEL
Albumin: 3.8 g/dL (ref 3.5–5.2)
Alkaline Phosphatase: 63 U/L (ref 39–117)
BUN: 23 mg/dL (ref 6–23)
Potassium: 3.9 mEq/L (ref 3.5–5.1)
Total Protein: 8.3 g/dL (ref 6.0–8.3)

## 2010-08-02 LAB — BASIC METABOLIC PANEL
Chloride: 105 mEq/L (ref 96–112)
GFR calc Af Amer: >60 mL/min (ref >60)
Potassium: 4.1 mEq/L (ref 3.5–5.1)
Sodium: 138 mEq/L (ref 135–145)

## 2010-08-02 NOTE — Telephone Encounter (Signed)
Received a call from [facility] with [facility]. Pt referred to GI in Feb.  Pt was scheduled for procedure, but directions were followed incorrectly  by stopping all meds ( not just coumadin) so pt ended up in AF and anesthesia would not sedate pt for procedure. They rescheduled procedure for March and pt cancelled procedure without a reason.  He has not called back to reschedule. Pt is being dismissed from there practice for not complying per Dr Bosie Clos. THis is to inform Dr Meredith Pel  Letter sent to Pt.

## 2010-08-08 ENCOUNTER — Encounter: Payer: Self-pay | Admitting: Internal Medicine

## 2010-08-08 ENCOUNTER — Encounter: Payer: Medicare Other | Admitting: Ophthalmology

## 2010-08-08 ENCOUNTER — Telehealth: Payer: Self-pay | Admitting: Internal Medicine

## 2010-08-08 ENCOUNTER — Ambulatory Visit (INDEPENDENT_AMBULATORY_CARE_PROVIDER_SITE_OTHER): Payer: Medicare Other | Admitting: Internal Medicine

## 2010-08-08 ENCOUNTER — Ambulatory Visit: Payer: Medicare Other | Admitting: Internal Medicine

## 2010-08-08 DIAGNOSIS — R634 Abnormal weight loss: Secondary | ICD-10-CM

## 2010-08-08 DIAGNOSIS — F32A Depression, unspecified: Secondary | ICD-10-CM

## 2010-08-08 DIAGNOSIS — J449 Chronic obstructive pulmonary disease, unspecified: Secondary | ICD-10-CM

## 2010-08-08 DIAGNOSIS — J4489 Other specified chronic obstructive pulmonary disease: Secondary | ICD-10-CM

## 2010-08-08 DIAGNOSIS — N259 Disorder resulting from impaired renal tubular function, unspecified: Secondary | ICD-10-CM

## 2010-08-08 DIAGNOSIS — F3289 Other specified depressive episodes: Secondary | ICD-10-CM

## 2010-08-08 DIAGNOSIS — I4891 Unspecified atrial fibrillation: Secondary | ICD-10-CM

## 2010-08-08 DIAGNOSIS — E0590 Thyrotoxicosis, unspecified without thyrotoxic crisis or storm: Secondary | ICD-10-CM

## 2010-08-08 DIAGNOSIS — I10 Essential (primary) hypertension: Secondary | ICD-10-CM

## 2010-08-08 DIAGNOSIS — F329 Major depressive disorder, single episode, unspecified: Secondary | ICD-10-CM

## 2010-08-08 LAB — BASIC METABOLIC PANEL
BUN: 33 mg/dL — ABNORMAL HIGH (ref 6–23)
Calcium: 10 mg/dL (ref 8.4–10.5)
Creat: 1.71 mg/dL — ABNORMAL HIGH (ref 0.40–1.50)

## 2010-08-08 MED ORDER — MIRTAZAPINE 15 MG PO TABS: 15.0000 mg | ORAL_TABLET | Freq: Every day | ORAL | Status: DC

## 2010-08-08 NOTE — Telephone Encounter (Signed)
Dr. [name], please advise. Patient has records in [facility] also.

## 2010-08-08 NOTE — Progress Notes (Signed)
  Subjective:    Patient ID: [name], male    DOB: [birth year], 65 y.o.   MRN: [id number]  HPI  65 yr old man with  Past Medical History  Diagnosis Date  . Atrial fibrillation with rapid ventricular response   . Hypertension   . Renal insufficiency     Renal U/S 12/04/2009 showed no pathological findings. Labs 12/04/2009 include normal ESR, C3, C4; neg ANA; SPEP showed nonspecific increase in the alpha-2 region with no M-spike; UPEP showed no monoclonal free light chains; urine IFE showed polyclonal increase in feree Kappa and/or free Lambda light chains.  . Chronic systolic heart failure   . Osteoarthritis   . Spondylosis   . Chronic pain   . Depression   . GERD (gastroesophageal reflux disease)   . Portal vein thrombosis   . Avascular necrosis     right  . 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 Gastritis   . Alcohol abuse   . Tobacco abuse   . Erectile dysfunction   . Bipolar disorder   . Hydrocele, unspecified   . Intermittent claudication   . Edema   . Lung nodule   . 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  Thyroid scan 08/28/2009 was normal with no focal areas of abnormal increased or decreased activity seen; the uptake of I 131 sodium iodide at 24 hours was 5.7%.  TSH and free T4 normalized by 08/16/2009.  [name] Pneumonia   . Intestinal obstruction   . Chronic systolic heart failure   . Other primary cardiomyopathies     2D echo on 02/06/2010 showed normal LV size with mild global hypokinesis, estimated EF 45%; mild biatrial enlargement; mild pulmonary hypertension (PA systolic pressure 42 mmHg assuming RA pressure 10 mmHg); normal RV size with mild systolic dysfunction.  [name] COPD (chronic obstructive pulmonary disease)    comes to the clinic complainining of depression for the last 2-3 months. Weight loss of 30 lbs in the last year. Associated with anorexia for the last year. Patient has a bowel movement every  3 days. Denies abdominal pain, distention, fever, hematochezia, melena, dysuria.   Patient reports that after he had hip replacement he has had body aches from waist down. Percocet helps body aches.   Review of Systems  [all other systems reviewed and are negative       Objective:   Physical Exam  [vitalsreviewed. Constitutional: He is oriented to person, place, and time. No distress.       thin  HENT:  Mouth/Throat: Oropharynx is clear and moist.  Eyes: Conjunctivae and EOM are normal. Pupils are equal, round, and reactive to light.  Neck: Normal range of motion. Neck supple.  Cardiovascular: Normal rate, regular rhythm and normal heart sounds.   Pulmonary/Chest: Effort normal and breath sounds normal. No respiratory distress. He has no wheezes.  Abdominal: Soft. Bowel sounds are normal. He exhibits no distension and no mass. There is no rebound and no guarding.       Mild tenderness to palpation diffusely  Musculoskeletal: He exhibits no edema.  Neurological: He is alert and oriented to person, place, and time. No cranial nerve deficit.  Skin: No rash noted. He is not diaphoretic. No erythema.  Psychiatric: He has a normal mood and affect.          Assessment & Plan:

## 2010-08-08 NOTE — Patient Instructions (Signed)
Make a follow up appointment in 2 weeks. Remember to stop Prozac and start Remeron as directed.

## 2010-08-09 NOTE — Assessment & Plan Note (Addendum)
Stable, on coumadin. Per Dr. Johney Frame continue multaq. Check renal function today.

## 2010-08-09 NOTE — Assessment & Plan Note (Signed)
Concerning for occult malignancy. Patient needs colonoscopy. Referral done to GI. Patient has seen Dr. Bosie Clos but he was kicked out of practice for missing appointment. If no local Gastroenterology practice willing to see patient may have to seen to [facility]. Will follow up.

## 2010-08-09 NOTE — Assessment & Plan Note (Signed)
Stable. Will order PFTs for definitive diagnosis.

## 2010-08-09 NOTE — Assessment & Plan Note (Signed)
At goal. Continue current regimen. 

## 2010-08-09 NOTE — Assessment & Plan Note (Signed)
Changed prozac to remeron today in hopes that medication will also help increase appetite. Will follow up in two weeks and consider increasing dose.

## 2010-08-09 NOTE — Assessment & Plan Note (Signed)
TSH wnl 2 months ago.

## 2010-08-09 NOTE — Assessment & Plan Note (Signed)
Baseline creatinine 1.6-1.8. Check bmet today.

## 2010-08-10 NOTE — Telephone Encounter (Signed)
[name]

## 2010-08-10 NOTE — Telephone Encounter (Signed)
That sounds strange. Was he cleared by a cardiologist?  Let's get his colonoscopy report. I will be happy to do it. If I understand the circumstances. May be he will need Propafol, so we really ought to see his record to avoid another arrhythmia. Or some other complications.

## 2010-08-10 NOTE — Telephone Encounter (Signed)
Left a message for [name] at outpatient to call me back.

## 2010-08-13 ENCOUNTER — Other Ambulatory Visit (INDEPENDENT_AMBULATORY_CARE_PROVIDER_SITE_OTHER): Payer: Medicare Other | Admitting: *Deleted

## 2010-08-13 DIAGNOSIS — K219 Gastro-esophageal reflux disease without esophagitis: Secondary | ICD-10-CM

## 2010-08-13 MED ORDER — OMEPRAZOLE 20 MG PO CPDR: 40.0000 mg | DELAYED_RELEASE_CAPSULE | Freq: Every day | ORAL | Status: DC

## 2010-08-13 NOTE — Telephone Encounter (Signed)
I changed med list as requested and sent new prescription.

## 2010-08-13 NOTE — Telephone Encounter (Signed)
Call for Prior Authorization on Omeprazole 40 mg tablets.  No Prior Authorization will be needed if pt is changed to Omeprazole 20 mg 2 daily.

## 2010-08-13 NOTE — Telephone Encounter (Signed)
Spoke with [facility] @ Dr Regino Schultze office regarding med clearance for colonoscopy.  Message left for Asher Muir [phone] [facility] office) inquiring about medical clearance and office notes.  She's currently out of the office and will contact me tomorrow.  Will contact Dr Regino Schultze office once I hear from cardiology and speak to pcp here in the office.

## 2010-08-15 ENCOUNTER — Encounter: Payer: Self-pay | Admitting: Internal Medicine

## 2010-08-15 ENCOUNTER — Ambulatory Visit (INDEPENDENT_AMBULATORY_CARE_PROVIDER_SITE_OTHER): Payer: Medicare Other | Admitting: Internal Medicine

## 2010-08-15 VITALS — BP 131/73 | HR 50 | Temp 97.8°F | Ht 72.5 in | Wt 154.3 lb

## 2010-08-15 DIAGNOSIS — Z125 Encounter for screening for malignant neoplasm of prostate: Secondary | ICD-10-CM

## 2010-08-15 DIAGNOSIS — R634 Abnormal weight loss: Secondary | ICD-10-CM

## 2010-08-15 DIAGNOSIS — Z7901 Long term (current) use of anticoagulants: Secondary | ICD-10-CM

## 2010-08-15 DIAGNOSIS — I4891 Unspecified atrial fibrillation: Secondary | ICD-10-CM

## 2010-08-15 DIAGNOSIS — J449 Chronic obstructive pulmonary disease, unspecified: Secondary | ICD-10-CM

## 2010-08-15 DIAGNOSIS — N259 Disorder resulting from impaired renal tubular function, unspecified: Secondary | ICD-10-CM

## 2010-08-15 DIAGNOSIS — M549 Dorsalgia, unspecified: Secondary | ICD-10-CM

## 2010-08-15 DIAGNOSIS — E0590 Thyrotoxicosis, unspecified without thyrotoxic crisis or storm: Secondary | ICD-10-CM

## 2010-08-15 LAB — SEDIMENTATION RATE: Sed Rate: 1 mm/hr (ref 0–16)

## 2010-08-15 MED ORDER — ALBUTEROL SULFATE HFA 108 (90 BASE) MCG/ACT IN AERS: 2.0000 | INHALATION_SPRAY | Freq: Four times a day (QID) | RESPIRATORY_TRACT | Status: DC | PRN

## 2010-08-15 NOTE — Progress Notes (Signed)
  Subjective:    Patient ID: [name], male    DOB: 03/31/1946, 65 y.o.   MRN: [id number]  HPI Patient returns for follow-up of his weight loss and other medical problems.  Today he complains of fatigability and loss of appetite.     Review of Systems  Constitutional: Positive for appetite change (Loss of appetite) and unexpected weight change.  Respiratory: Positive for shortness of breath. Negative for wheezing.   Cardiovascular: Negative for chest pain and leg swelling.  Gastrointestinal: Negative for nausea, vomiting and abdominal pain.  Musculoskeletal: Positive for back pain and arthralgias (Bilateral hip pain).       Objective:   Physical Exam  Constitutional: No distress.  Cardiovascular: Normal rate and regular rhythm.  Exam reveals no gallop and no friction rub.   No murmur heard. Pulmonary/Chest: Effort normal and breath sounds normal. He has no wheezes. He has no rales.  Abdominal: Soft. Bowel sounds are normal. He exhibits no distension and no mass. There is no rebound and no guarding.  Musculoskeletal: He exhibits no edema.        Assessment & Plan:

## 2010-08-15 NOTE — Assessment & Plan Note (Signed)
Patient's heart rate is currently controlled on Multaq.

## 2010-08-15 NOTE — Assessment & Plan Note (Signed)
Given weight loss, plan is to check a TSH and free T4.

## 2010-08-15 NOTE — Patient Instructions (Signed)
Start albuterol inhaler 2 puffs every six hours as needed for wheezing or shortness of breath.

## 2010-08-15 NOTE — Telephone Encounter (Signed)
Follow-up call made to Dr Eliot Ford office.  Message left on recorder for [facility] regarding pt's medical clearance for colonoscopy.

## 2010-08-15 NOTE — Assessment & Plan Note (Signed)
Patient's anorexia and ongoing weight loss is concerning for malignancy.  A colonoscopy was scheduled to evaluate rectal thickening concerning for malignancy seen on abdominal/pelvic CT scan, but had to be canceled because of rapid atrial fibrillation when the patient missed his medication that day.  We have had trouble rescheduling him with GI.  Apparently Dr. [name] of [facility] has agreed to see the patient for colonoscopy if he is cleared by cardiology, and K. Criss Alvine has contacted patient's cardiologuist Dr. Johney Frame of [facility] to arrange cardiology clearance.  I will also check a sedimentation rate and CRP.  If the GI workup is unrevealing, then I will consider a total body bone scan.  I advised patient to use nutritional supplements such as Ensure In hopes of stabilizing his weight.

## 2010-08-15 NOTE — Assessment & Plan Note (Signed)
Patient's last INR was sub-therapeutic; he reports today that he has missed some recent doses.  I advised him to schedule follow-up in the anticoagulation clinic with Dr. Alexandria Lodge.  His warfarin will need to be held for colonoscopy.

## 2010-08-15 NOTE — Assessment & Plan Note (Addendum)
Patient reports some dyspnea; the plan is to start albuterol inhaler, and schedule full pulmonary function studies.

## 2010-08-15 NOTE — Assessment & Plan Note (Signed)
Plan is to continue current dose of Percocet.

## 2010-08-16 NOTE — Progress Notes (Signed)
Addended by: Kingsley Spittle on: 08/16/2010 10:31 AM   Modules accepted: Orders

## 2010-08-21 ENCOUNTER — Other Ambulatory Visit: Payer: Self-pay | Admitting: Internal Medicine

## 2010-08-21 ENCOUNTER — Other Ambulatory Visit: Payer: Self-pay | Admitting: *Deleted

## 2010-08-21 ENCOUNTER — Ambulatory Visit (HOSPITAL_COMMUNITY)
Admission: RE | Admit: 2010-08-21 | Discharge: 2010-08-21 | Disposition: A | Discharge status: Home / Self Care | Payer: Medicare Other | Source: Ambulatory Visit | Attending: Internal Medicine | Admitting: Internal Medicine

## 2010-08-21 ENCOUNTER — Encounter (HOSPITAL_COMMUNITY): Payer: Medicare Other

## 2010-08-21 DIAGNOSIS — J4489 Other specified chronic obstructive pulmonary disease: Secondary | ICD-10-CM | POA: Insufficient documentation

## 2010-08-21 DIAGNOSIS — J449 Chronic obstructive pulmonary disease, unspecified: Secondary | ICD-10-CM | POA: Insufficient documentation

## 2010-08-21 MED ORDER — OXYCODONE-ACETAMINOPHEN 10-650 MG PO TABS: 1.0000 | ORAL_TABLET | Freq: Four times a day (QID) | ORAL | Status: DC | PRN

## 2010-08-21 NOTE — Telephone Encounter (Signed)
Refilled by Dr. Rogelia Boga.

## 2010-08-21 NOTE — Telephone Encounter (Signed)
Last refill 2/29 Pt would like to pick up today 3/3 at 1300

## 2010-08-22 ENCOUNTER — Telehealth: Payer: Self-pay | Admitting: Internal Medicine

## 2010-08-22 NOTE — Telephone Encounter (Signed)
[name] is scheduling with [facility].  See notes.

## 2010-08-23 ENCOUNTER — Telehealth: Payer: Self-pay | Admitting: *Deleted

## 2010-08-23 NOTE — Telephone Encounter (Signed)
Review of chart shows that atenolol was stopped in January due to symptomatic bradycardia; agree with plan.

## 2010-08-23 NOTE — Telephone Encounter (Signed)
rec'd refill request from [facility] for at[facility], reviewed chart could not find in med list, spoke w/ pharm, written 10/2009. Spoke w/ dr Meredith Pel, removed from pt med list 06/01/2010 during hosp visit. Advised by dr Meredith Pel to inform pt to discontinue use unless advised by cardiologist to continue. Spoke w/ pt, he has not been advised to continue atenolol specifically but has continued to take it, he was advised to stop taking and verbally acknowledges that he will stop.

## 2010-08-25 LAB — DIFFERENTIAL
Lymphocytes Relative: 21 % (ref 12–46)
Lymphs Abs: 2.5 10*3/uL (ref 0.7–4.0)
Monocytes Relative: 11 % (ref 3–12)
Neutrophils Relative %: 66 % (ref 43–77)

## 2010-08-25 LAB — URINE CULTURE: Colony Count: NO GROWTH

## 2010-08-25 LAB — PROTIME-INR
INR: 2.6 — ABNORMAL HIGH (ref 0.00–1.49)
Prothrombin Time: 27.8 seconds — ABNORMAL HIGH (ref 11.6–15.2)

## 2010-08-25 LAB — URINALYSIS, ROUTINE W REFLEX MICROSCOPIC
Bilirubin Urine: NEGATIVE
Glucose, UA: NEGATIVE mg/dL
Hgb urine dipstick: NEGATIVE
Ketones, ur: NEGATIVE mg/dL
Nitrite: NEGATIVE
Specific Gravity, Urine: 1.013 (ref 1.005–1.030)
pH: 7.5 (ref 5.0–8.0)

## 2010-08-25 LAB — POCT CARDIAC MARKERS
CKMB, poc: <1 ng/mL — ABNORMAL LOW (ref 1.0–8.0)
Myoglobin, poc: 85.6 ng/mL (ref 12–200)
Troponin i, poc: <0.05 ng/mL (ref 0.00–0.09)

## 2010-08-25 LAB — BASIC METABOLIC PANEL
BUN: 17 mg/dL (ref 6–23)
CO2: 25 mEq/L (ref 19–32)
Chloride: 107 mEq/L (ref 96–112)
GFR calc non Af Amer: 58 mL/min — ABNORMAL LOW (ref >60)
Glucose, Bld: 81 mg/dL (ref 70–99)
Potassium: 4.7 mEq/L (ref 3.5–5.1)
Sodium: 138 mEq/L (ref 135–145)

## 2010-08-25 LAB — CBC
Platelets: 189 10*3/uL (ref 150–400)
RBC: 4.34 MIL/uL (ref 4.22–5.81)
WBC: 11.6 10*3/uL — ABNORMAL HIGH (ref 4.0–10.5)

## 2010-08-26 LAB — DIFFERENTIAL
Basophils Absolute: 0 10*3/uL (ref 0.0–0.1)
Basophils Absolute: 0 10*3/uL (ref 0.0–0.1)
Basophils Absolute: 0 10*3/uL (ref 0.0–0.1)
Basophils Absolute: 0 10*3/uL (ref 0.0–0.1)
Basophils Absolute: 0 10*3/uL (ref 0.0–0.1)
Basophils Relative: 0 % (ref 0–1)
Basophils Relative: 1 % (ref 0–1)
Basophils Relative: 0 % (ref 0–1)
Eosinophils Absolute: 0.2 10*3/uL (ref 0.0–0.7)
Eosinophils Absolute: 0.2 10*3/uL (ref 0.0–0.7)
Eosinophils Relative: 1 % (ref 0–5)
Eosinophils Relative: 2 % (ref 0–5)
Eosinophils Relative: 3 % (ref 0–5)
Lymphocytes Relative: 14 % (ref 12–46)
Lymphocytes Relative: 31 % (ref 12–46)
Lymphs Abs: 2.9 10*3/uL (ref 0.7–4.0)
Neutro Abs: 5 10*3/uL (ref 1.7–7.7)
Neutrophils Relative %: 49 % (ref 43–77)
Neutrophils Relative %: 57 % (ref 43–77)
Neutrophils Relative %: 77 % (ref 43–77)
Neutrophils Relative %: 78 % — ABNORMAL HIGH (ref 43–77)
Neutrophils Relative %: 61 % (ref 43–77)

## 2010-08-26 LAB — CBC
HCT: 35.8 % — ABNORMAL LOW (ref 39.0–52.0)
HCT: 36.3 % — ABNORMAL LOW (ref 39.0–52.0)
HCT: 39.8 % (ref 39.0–52.0)
HCT: 35.8 % — ABNORMAL LOW (ref 39.0–52.0)
HCT: 48.3 % (ref 39.0–52.0)
Hemoglobin: 11.2 g/dL — ABNORMAL LOW (ref 13.0–17.0)
Hemoglobin: 11.2 g/dL — ABNORMAL LOW (ref 13.0–17.0)
Hemoglobin: 12 g/dL — ABNORMAL LOW (ref 13.0–17.0)
Hemoglobin: 12.1 g/dL — ABNORMAL LOW (ref 13.0–17.0)
Hemoglobin: 12.5 g/dL — ABNORMAL LOW (ref 13.0–17.0)
Hemoglobin: 13 g/dL (ref 13.0–17.0)
Hemoglobin: 16.2 g/dL (ref 13.0–17.0)
MCHC: 33.5 g/dL (ref 30.0–36.0)
MCHC: 33.6 g/dL (ref 30.0–36.0)
MCHC: 34.1 g/dL (ref 30.0–36.0)
MCHC: 34.1 g/dL (ref 30.0–36.0)
MCHC: 34.1 g/dL (ref 30.0–36.0)
MCHC: 34.3 g/dL (ref 30.0–36.0)
MCHC: 33.5 g/dL (ref 30.0–36.0)
MCHC: 35.4 g/dL (ref 30.0–36.0)
MCV: 87.3 fL (ref 78.0–100.0)
MCV: 87.8 fL (ref 78.0–100.0)
MCV: 88.3 fL (ref 78.0–100.0)
Platelets: 173 10*3/uL (ref 150–400)
Platelets: 175 10*3/uL (ref 150–400)
Platelets: 188 10*3/uL (ref 150–400)
Platelets: 196 10*3/uL (ref 150–400)
Platelets: 197 10*3/uL (ref 150–400)
Platelets: 204 10*3/uL (ref 150–400)
Platelets: 217 10*3/uL (ref 150–400)
Platelets: 217 10*3/uL (ref 150–400)
Platelets: 223 10*3/uL (ref 150–400)
Platelets: 167 10*3/uL (ref 150–400)
Platelets: 202 10*3/uL (ref 150–400)
RBC: 4.1 MIL/uL — ABNORMAL LOW (ref 4.22–5.81)
RBC: 4.12 MIL/uL — ABNORMAL LOW (ref 4.22–5.81)
RBC: 4.15 MIL/uL — ABNORMAL LOW (ref 4.22–5.81)
RBC: 4.2 MIL/uL — ABNORMAL LOW (ref 4.22–5.81)
RDW: 16.9 % — ABNORMAL HIGH (ref 11.5–15.5)
RDW: 17.3 % — ABNORMAL HIGH (ref 11.5–15.5)
RDW: 17.3 % — ABNORMAL HIGH (ref 11.5–15.5)
RDW: 17.3 % — ABNORMAL HIGH (ref 11.5–15.5)
RDW: 17.5 % — ABNORMAL HIGH (ref 11.5–15.5)
RDW: 17.5 % — ABNORMAL HIGH (ref 11.5–15.5)
RDW: 19.1 % — ABNORMAL HIGH (ref 11.5–15.5)
RDW: 19.2 % — ABNORMAL HIGH (ref 11.5–15.5)
RDW: 18.9 % — ABNORMAL HIGH (ref 11.5–15.5)
RDW: 18.9 % — ABNORMAL HIGH (ref 11.5–15.5)
WBC: 11.8 10*3/uL — ABNORMAL HIGH (ref 4.0–10.5)
WBC: 7.6 10*3/uL (ref 4.0–10.5)
WBC: 12.4 10*3/uL — ABNORMAL HIGH (ref 4.0–10.5)
WBC: 13.6 10*3/uL — ABNORMAL HIGH (ref 4.0–10.5)
WBC: 14.8 10*3/uL — ABNORMAL HIGH (ref 4.0–10.5)

## 2010-08-26 LAB — CROSSMATCH
ABO/RH(D): AB POS
Antibody Screen: NEGATIVE

## 2010-08-26 LAB — BASIC METABOLIC PANEL
BUN: 14 mg/dL (ref 6–23)
BUN: 17 mg/dL (ref 6–23)
BUN: 18 mg/dL (ref 6–23)
BUN: 19 mg/dL (ref 6–23)
BUN: 2 mg/dL — ABNORMAL LOW (ref 6–23)
BUN: 2 mg/dL — ABNORMAL LOW (ref 6–23)
BUN: 23 mg/dL (ref 6–23)
BUN: 3 mg/dL — ABNORMAL LOW (ref 6–23)
BUN: 4 mg/dL — ABNORMAL LOW (ref 6–23)
BUN: 8 mg/dL (ref 6–23)
BUN: 36 mg/dL — ABNORMAL HIGH (ref 6–23)
CO2: 21 mEq/L (ref 19–32)
CO2: 23 mEq/L (ref 19–32)
CO2: 24 mEq/L (ref 19–32)
CO2: 24 mEq/L (ref 19–32)
CO2: 25 mEq/L (ref 19–32)
CO2: 25 mEq/L (ref 19–32)
CO2: 21 mEq/L (ref 19–32)
CO2: 23 mEq/L (ref 19–32)
Calcium: 8.1 mg/dL — ABNORMAL LOW (ref 8.4–10.5)
Calcium: 8.3 mg/dL — ABNORMAL LOW (ref 8.4–10.5)
Calcium: 8.3 mg/dL — ABNORMAL LOW (ref 8.4–10.5)
Calcium: 8.3 mg/dL — ABNORMAL LOW (ref 8.4–10.5)
Calcium: 8.3 mg/dL — ABNORMAL LOW (ref 8.4–10.5)
Calcium: 8.4 mg/dL (ref 8.4–10.5)
Calcium: 8.5 mg/dL (ref 8.4–10.5)
Calcium: 8.5 mg/dL (ref 8.4–10.5)
Calcium: 9.3 mg/dL (ref 8.4–10.5)
Calcium: 8.5 mg/dL (ref 8.4–10.5)
Calcium: 8.6 mg/dL (ref 8.4–10.5)
Calcium: 8.7 mg/dL (ref 8.4–10.5)
Calcium: 8.7 mg/dL (ref 8.4–10.5)
Calcium: 8.7 mg/dL (ref 8.4–10.5)
Chloride: 107 mEq/L (ref 96–112)
Chloride: 107 mEq/L (ref 96–112)
Chloride: 111 mEq/L (ref 96–112)
Creatinine, Ser: 0.94 mg/dL (ref 0.4–1.5)
Creatinine, Ser: 0.99 mg/dL (ref 0.4–1.5)
Creatinine, Ser: 0.99 mg/dL (ref 0.4–1.5)
Creatinine, Ser: 1.05 mg/dL (ref 0.4–1.5)
Creatinine, Ser: 1.09 mg/dL (ref 0.4–1.5)
Creatinine, Ser: 1.1 mg/dL (ref 0.4–1.5)
Creatinine, Ser: 1.11 mg/dL (ref 0.4–1.5)
Creatinine, Ser: 1.14 mg/dL (ref 0.4–1.5)
Creatinine, Ser: 1.14 mg/dL (ref 0.4–1.5)
Creatinine, Ser: 1.24 mg/dL (ref 0.4–1.5)
Creatinine, Ser: 1.57 mg/dL — ABNORMAL HIGH (ref 0.4–1.5)
Creatinine, Ser: 2.19 mg/dL — ABNORMAL HIGH (ref 0.4–1.5)
GFR calc Af Amer: >60 mL/min (ref >60)
GFR calc Af Amer: >60 mL/min (ref >60)
GFR calc Af Amer: 54 mL/min — ABNORMAL LOW (ref >60)
GFR calc Af Amer: >60 mL/min (ref >60)
GFR calc Af Amer: >60 mL/min (ref >60)
GFR calc non Af Amer: 59 mL/min — ABNORMAL LOW (ref >60)
GFR calc non Af Amer: >60 mL/min (ref >60)
GFR calc non Af Amer: >60 mL/min (ref >60)
GFR calc non Af Amer: >60 mL/min (ref >60)
GFR calc non Af Amer: >60 mL/min (ref >60)
GFR calc non Af Amer: >60 mL/min (ref >60)
GFR calc non Af Amer: 34 mL/min — ABNORMAL LOW (ref >60)
GFR calc non Af Amer: 45 mL/min — ABNORMAL LOW (ref >60)
GFR calc non Af Amer: 57 mL/min — ABNORMAL LOW (ref >60)
GFR calc non Af Amer: >60 mL/min (ref >60)
Glucose, Bld: 101 mg/dL — ABNORMAL HIGH (ref 70–99)
Glucose, Bld: 105 mg/dL — ABNORMAL HIGH (ref 70–99)
Glucose, Bld: 108 mg/dL — ABNORMAL HIGH (ref 70–99)
Glucose, Bld: 72 mg/dL (ref 70–99)
Glucose, Bld: 87 mg/dL (ref 70–99)
Glucose, Bld: 88 mg/dL (ref 70–99)
Glucose, Bld: 89 mg/dL (ref 70–99)
Glucose, Bld: 93 mg/dL (ref 70–99)
Glucose, Bld: 97 mg/dL (ref 70–99)
Glucose, Bld: 126 mg/dL — ABNORMAL HIGH (ref 70–99)
Glucose, Bld: 223 mg/dL — ABNORMAL HIGH (ref 70–99)
Potassium: 4.2 mEq/L (ref 3.5–5.1)
Potassium: 4.6 mEq/L (ref 3.5–5.1)
Potassium: 4.7 mEq/L (ref 3.5–5.1)
Sodium: 144 mEq/L (ref 135–145)
Sodium: 139 mEq/L (ref 135–145)
Sodium: 141 mEq/L (ref 135–145)
Sodium: 142 mEq/L (ref 135–145)
Sodium: 143 mEq/L (ref 135–145)

## 2010-08-26 LAB — RETICULOCYTES
RBC.: 4.61 MIL/uL (ref 4.22–5.81)
Retic Count, Absolute: 50.7 10*3/uL (ref 19.0–186.0)

## 2010-08-26 LAB — MAGNESIUM
Magnesium: 1.7 mg/dL (ref 1.5–2.5)
Magnesium: 1.9 mg/dL (ref 1.5–2.5)
Magnesium: 2.3 mg/dL (ref 1.5–2.5)

## 2010-08-26 LAB — COMPREHENSIVE METABOLIC PANEL
Alkaline Phosphatase: 51 U/L (ref 39–117)
BUN: 30 mg/dL — ABNORMAL HIGH (ref 6–23)
Chloride: 111 mEq/L (ref 96–112)
GFR calc non Af Amer: 31 mL/min — ABNORMAL LOW (ref >60)
Glucose, Bld: 84 mg/dL (ref 70–99)
Potassium: 4.9 mEq/L (ref 3.5–5.1)
Total Bilirubin: 1.3 mg/dL — ABNORMAL HIGH (ref 0.3–1.2)

## 2010-08-26 LAB — HIV ANTIBODY (ROUTINE TESTING W REFLEX): HIV: NONREACTIVE

## 2010-08-26 LAB — HEPARIN LEVEL (UNFRACTIONATED)
Heparin Unfractionated: 0.32 IU/mL (ref 0.30–0.70)
Heparin Unfractionated: 0.36 IU/mL (ref 0.30–0.70)
Heparin Unfractionated: 0.37 IU/mL (ref 0.30–0.70)
Heparin Unfractionated: 0.37 IU/mL (ref 0.30–0.70)
Heparin Unfractionated: 0.41 IU/mL (ref 0.30–0.70)
Heparin Unfractionated: 0.52 IU/mL (ref 0.30–0.70)
Heparin Unfractionated: 0.57 IU/mL (ref 0.30–0.70)

## 2010-08-26 LAB — DRUGS OF ABUSE SCREEN W/O ALC, ROUTINE URINE
Barbiturate Quant, Ur: NEGATIVE
Benzodiazepines.: NEGATIVE
Cocaine Metabolites: NEGATIVE
Methadone: NEGATIVE
Opiate Screen, Urine: NEGATIVE
Phencyclidine (PCP): NEGATIVE

## 2010-08-26 LAB — CARDIAC PANEL(CRET KIN+CKTOT+MB+TROPI)
Relative Index: INVALID (ref 0.0–2.5)
Relative Index: INVALID (ref 0.0–2.5)
Relative Index: INVALID (ref 0.0–2.5)
Total CK: 31 U/L (ref 7–232)
Total CK: 13 U/L (ref 7–232)
Total CK: 27 U/L (ref 7–232)

## 2010-08-26 LAB — PROTIME-INR
INR: 1.7 — ABNORMAL HIGH (ref 0.00–1.49)
INR: 2.4 — ABNORMAL HIGH (ref 0.00–1.49)
INR: 2.8 — ABNORMAL HIGH (ref 0.00–1.49)
INR: 1.1 (ref 0.00–1.49)
INR: 1.3 (ref 0.00–1.49)
INR: 2.1 — ABNORMAL HIGH (ref 0.00–1.49)
INR: 2.5 — ABNORMAL HIGH (ref 0.00–1.49)
Prothrombin Time: 19.9 seconds — ABNORMAL HIGH (ref 11.6–15.2)
Prothrombin Time: 20.5 seconds — ABNORMAL HIGH (ref 11.6–15.2)
Prothrombin Time: 23.4 seconds — ABNORMAL HIGH (ref 11.6–15.2)
Prothrombin Time: 25.2 seconds — ABNORMAL HIGH (ref 11.6–15.2)
Prothrombin Time: 26.4 seconds — ABNORMAL HIGH (ref 11.6–15.2)
Prothrombin Time: 29.6 seconds — ABNORMAL HIGH (ref 11.6–15.2)
Prothrombin Time: 33 seconds — ABNORMAL HIGH (ref 11.6–15.2)
Prothrombin Time: 14.1 seconds (ref 11.6–15.2)
Prothrombin Time: 14.5 seconds (ref 11.6–15.2)
Prothrombin Time: 16.4 seconds — ABNORMAL HIGH (ref 11.6–15.2)
Prothrombin Time: 28.9 seconds — ABNORMAL HIGH (ref 11.6–15.2)

## 2010-08-26 LAB — IRON AND TIBC
Iron: 34 ug/dL — ABNORMAL LOW (ref 42–135)
UIBC: 203 ug/dL

## 2010-08-26 LAB — PHOSPHORUS
Phosphorus: 2.9 mg/dL (ref 2.3–4.6)
Phosphorus: 3.8 mg/dL (ref 2.3–4.6)

## 2010-08-26 LAB — URINALYSIS, ROUTINE W REFLEX MICROSCOPIC
Ketones, ur: NEGATIVE mg/dL
Nitrite: NEGATIVE
Protein, ur: 30 mg/dL — AB
pH: 6.5 (ref 5.0–8.0)

## 2010-08-26 LAB — HEPATIC FUNCTION PANEL
AST: 25 U/L (ref 0–37)
Albumin: 2.7 g/dL — ABNORMAL LOW (ref 3.5–5.2)
Alkaline Phosphatase: 52 U/L (ref 39–117)
Total Bilirubin: 0.8 mg/dL (ref 0.3–1.2)
Total Protein: 5.9 g/dL — ABNORMAL LOW (ref 6.0–8.3)

## 2010-08-26 LAB — VANCOMYCIN, TROUGH: Vancomycin Tr: 16 ug/mL (ref 10.0–20.0)

## 2010-08-26 LAB — RAPID URINE DRUG SCREEN, HOSP PERFORMED
Benzodiazepines: NOT DETECTED
Cocaine: NOT DETECTED
Opiates: POSITIVE — AB
Tetrahydrocannabinol: POSITIVE — AB

## 2010-08-26 LAB — CULTURE, BLOOD (ROUTINE X 2): Culture: NO GROWTH

## 2010-08-26 LAB — TSH: TSH: 3.621 u[IU]/mL (ref 0.350–4.500)

## 2010-08-26 LAB — CK TOTAL AND CKMB (NOT AT ARMC): Total CK: 36 U/L (ref 7–232)

## 2010-08-26 LAB — TROPONIN I: Troponin I: 0.04 ng/mL (ref 0.00–0.06)

## 2010-08-26 LAB — URINE MICROSCOPIC-ADD ON

## 2010-08-26 LAB — VITAMIN B12: Vitamin B-12: 324 pg/mL (ref 211–911)

## 2010-08-27 LAB — CBC
HCT: 40.5 % (ref 39.0–52.0)
HCT: 42.5 % (ref 39.0–52.0)
HCT: 56.3 % — ABNORMAL HIGH (ref 39.0–52.0)
Hemoglobin: 12.7 g/dL — ABNORMAL LOW (ref 13.0–17.0)
Hemoglobin: 13.7 g/dL (ref 13.0–17.0)
Hemoglobin: 14.1 g/dL (ref 13.0–17.0)
Hemoglobin: 18.5 g/dL — ABNORMAL HIGH (ref 13.0–17.0)
MCHC: 32.9 g/dL (ref 30.0–36.0)
MCHC: 33.6 g/dL (ref 30.0–36.0)
MCHC: 33.7 g/dL (ref 30.0–36.0)
MCV: 89.7 fL (ref 78.0–100.0)
MCV: 89.8 fL (ref 78.0–100.0)
MCV: 89.9 fL (ref 78.0–100.0)
MCV: 91.4 fL (ref 78.0–100.0)
Platelets: 117 10*3/uL — ABNORMAL LOW (ref 150–400)
RBC: 4.21 MIL/uL — ABNORMAL LOW (ref 4.22–5.81)
RBC: 4.51 MIL/uL (ref 4.22–5.81)
RBC: 4.74 MIL/uL (ref 4.22–5.81)
RBC: 4.95 MIL/uL (ref 4.22–5.81)
RDW: 17.6 % — ABNORMAL HIGH (ref 11.5–15.5)
RDW: 18.6 % — ABNORMAL HIGH (ref 11.5–15.5)
WBC: 16.2 10*3/uL — ABNORMAL HIGH (ref 4.0–10.5)
WBC: 18.8 10*3/uL — ABNORMAL HIGH (ref 4.0–10.5)
WBC: 9.2 10*3/uL (ref 4.0–10.5)

## 2010-08-27 LAB — BASIC METABOLIC PANEL
BUN: 45 mg/dL — ABNORMAL HIGH (ref 6–23)
CO2: 18 mEq/L — ABNORMAL LOW (ref 19–32)
CO2: 18 mEq/L — ABNORMAL LOW (ref 19–32)
CO2: 19 mEq/L (ref 19–32)
CO2: 19 mEq/L (ref 19–32)
CO2: 24 mEq/L (ref 19–32)
Calcium: 8.5 mg/dL (ref 8.4–10.5)
Calcium: 9.8 mg/dL (ref 8.4–10.5)
Chloride: 109 mEq/L (ref 96–112)
Chloride: 109 mEq/L (ref 96–112)
Chloride: 110 mEq/L (ref 96–112)
Chloride: 113 mEq/L — ABNORMAL HIGH (ref 96–112)
Chloride: 115 mEq/L — ABNORMAL HIGH (ref 96–112)
Creatinine, Ser: 1.48 mg/dL (ref 0.4–1.5)
GFR calc Af Amer: 58 mL/min — ABNORMAL LOW (ref >60)
GFR calc Af Amer: 60 mL/min — ABNORMAL LOW (ref >60)
GFR calc Af Amer: >60 mL/min (ref >60)
GFR calc Af Amer: >60 mL/min (ref >60)
GFR calc non Af Amer: 47 mL/min — ABNORMAL LOW (ref >60)
Glucose, Bld: 121 mg/dL — ABNORMAL HIGH (ref 70–99)
Glucose, Bld: 127 mg/dL — ABNORMAL HIGH (ref 70–99)
Glucose, Bld: 94 mg/dL (ref 70–99)
Glucose, Bld: 96 mg/dL (ref 70–99)
Potassium: 4.4 mEq/L (ref 3.5–5.1)
Potassium: 4.6 mEq/L (ref 3.5–5.1)
Potassium: 4.7 mEq/L (ref 3.5–5.1)
Potassium: 4.8 mEq/L (ref 3.5–5.1)
Potassium: 5.1 mEq/L (ref 3.5–5.1)
Sodium: 137 mEq/L (ref 135–145)
Sodium: 138 mEq/L (ref 135–145)
Sodium: 138 mEq/L (ref 135–145)
Sodium: 139 mEq/L (ref 135–145)
Sodium: 139 mEq/L (ref 135–145)
Sodium: 140 mEq/L (ref 135–145)
Sodium: 140 mEq/L (ref 135–145)

## 2010-08-27 LAB — POCT I-STAT, CHEM 8
BUN: 76 mg/dL — ABNORMAL HIGH (ref 6–23)
Creatinine, Ser: 3 mg/dL — ABNORMAL HIGH (ref 0.4–1.5)
Glucose, Bld: 97 mg/dL (ref 70–99)
Hemoglobin: 21.1 g/dL — ABNORMAL HIGH (ref 13.0–17.0)
Potassium: 4.7 mEq/L (ref 3.5–5.1)

## 2010-08-27 LAB — CARDIAC PANEL(CRET KIN+CKTOT+MB+TROPI)
CK, MB: 1.2 ng/mL (ref 0.3–4.0)
CK, MB: 1.5 ng/mL (ref 0.3–4.0)
Relative Index: 1.3 (ref 0.0–2.5)
Relative Index: INVALID (ref 0.0–2.5)
Relative Index: INVALID (ref 0.0–2.5)
Total CK: 39 U/L (ref 7–232)
Total CK: 44 U/L (ref 7–232)
Troponin I: 0.02 ng/mL (ref 0.00–0.06)
Troponin I: 0.03 ng/mL (ref 0.00–0.06)

## 2010-08-27 LAB — URINALYSIS, ROUTINE W REFLEX MICROSCOPIC
Leukocytes, UA: NEGATIVE
Nitrite: NEGATIVE
Protein, ur: NEGATIVE mg/dL
Specific Gravity, Urine: 1.021 (ref 1.005–1.030)
Urobilinogen, UA: 0.2 mg/dL (ref 0.0–1.0)

## 2010-08-27 LAB — POCT CARDIAC MARKERS
CKMB, poc: <1 ng/mL — ABNORMAL LOW (ref 1.0–8.0)
Myoglobin, poc: 220 ng/mL (ref 12–200)
Troponin i, poc: <0.05 ng/mL (ref 0.00–0.09)

## 2010-08-27 LAB — RENAL FUNCTION PANEL
Albumin: 3.2 g/dL — ABNORMAL LOW (ref 3.5–5.2)
BUN: 28 mg/dL — ABNORMAL HIGH (ref 6–23)
BUN: 55 mg/dL — ABNORMAL HIGH (ref 6–23)
CO2: 19 mEq/L (ref 19–32)
Chloride: 114 mEq/L — ABNORMAL HIGH (ref 96–112)
Creatinine, Ser: 2.07 mg/dL — ABNORMAL HIGH (ref 0.4–1.5)
GFR calc Af Amer: 39 mL/min — ABNORMAL LOW (ref >60)
GFR calc non Af Amer: 32 mL/min — ABNORMAL LOW (ref >60)
Glucose, Bld: 107 mg/dL — ABNORMAL HIGH (ref 70–99)
Phosphorus: 4.1 mg/dL (ref 2.3–4.6)
Potassium: 4 mEq/L (ref 3.5–5.1)
Potassium: 4.3 mEq/L (ref 3.5–5.1)
Sodium: 139 mEq/L (ref 135–145)

## 2010-08-27 LAB — DIFFERENTIAL
Basophils Absolute: 0.1 10*3/uL (ref 0.0–0.1)
Basophils Relative: 1 % (ref 0–1)
Eosinophils Relative: 1 % (ref 0–5)
Lymphocytes Relative: 32 % (ref 12–46)
Monocytes Absolute: 0.8 10*3/uL (ref 0.1–1.0)

## 2010-08-27 LAB — PROTIME-INR
INR: 2.3 — ABNORMAL HIGH (ref 0.00–1.49)
INR: 3.1 — ABNORMAL HIGH (ref 0.00–1.49)
Prothrombin Time: 32.5 seconds — ABNORMAL HIGH (ref 11.6–15.2)

## 2010-08-27 LAB — COMPREHENSIVE METABOLIC PANEL
Albumin: 3.3 g/dL — ABNORMAL LOW (ref 3.5–5.2)
Alkaline Phosphatase: 43 U/L (ref 39–117)
BUN: 65 mg/dL — ABNORMAL HIGH (ref 6–23)
CO2: 21 mEq/L (ref 19–32)
Chloride: 113 mEq/L — ABNORMAL HIGH (ref 96–112)
GFR calc non Af Amer: 25 mL/min — ABNORMAL LOW (ref >60)
Glucose, Bld: 111 mg/dL — ABNORMAL HIGH (ref 70–99)
Potassium: 4 mEq/L (ref 3.5–5.1)
Total Bilirubin: 1.3 mg/dL — ABNORMAL HIGH (ref 0.3–1.2)

## 2010-08-27 LAB — LEGIONELLA ANTIGEN, URINE

## 2010-08-27 LAB — APTT: aPTT: 30 seconds (ref 24–37)

## 2010-08-27 LAB — MAGNESIUM: Magnesium: 2.3 mg/dL (ref 1.5–2.5)

## 2010-08-27 LAB — URINE MICROSCOPIC-ADD ON

## 2010-08-27 LAB — CREATININE, URINE, RANDOM: Creatinine, Urine: 134 mg/dL

## 2010-08-28 LAB — COMPREHENSIVE METABOLIC PANEL
ALT: 8 U/L (ref 0–53)
AST: 17 U/L (ref 0–37)
Albumin: 3.2 g/dL — ABNORMAL LOW (ref 3.5–5.2)
Albumin: 3.3 g/dL — ABNORMAL LOW (ref 3.5–5.2)
Alkaline Phosphatase: 86 U/L (ref 39–117)
BUN: 4 mg/dL — ABNORMAL LOW (ref 6–23)
Calcium: 9.6 mg/dL (ref 8.4–10.5)
Chloride: 111 mEq/L (ref 96–112)
Creatinine, Ser: 1.3 mg/dL (ref 0.4–1.5)
GFR calc Af Amer: >60 mL/min (ref >60)
GFR calc Af Amer: >60 mL/min (ref >60)
Potassium: 3.1 mEq/L — ABNORMAL LOW (ref 3.5–5.1)
Sodium: 143 mEq/L (ref 135–145)
Sodium: 145 mEq/L (ref 135–145)
Total Protein: 7.1 g/dL (ref 6.0–8.3)

## 2010-08-28 LAB — BASIC METABOLIC PANEL
BUN: 5 mg/dL — ABNORMAL LOW (ref 6–23)
BUN: 5 mg/dL — ABNORMAL LOW (ref 6–23)
CO2: 21 mEq/L (ref 19–32)
Calcium: 8.3 mg/dL — ABNORMAL LOW (ref 8.4–10.5)
Chloride: 116 mEq/L — ABNORMAL HIGH (ref 96–112)
Creatinine, Ser: 1.22 mg/dL (ref 0.4–1.5)
GFR calc Af Amer: >60 mL/min (ref >60)
GFR calc non Af Amer: 58 mL/min — ABNORMAL LOW (ref >60)
Potassium: 3.5 mEq/L (ref 3.5–5.1)
Potassium: 3.7 mEq/L (ref 3.5–5.1)
Sodium: 142 mEq/L (ref 135–145)

## 2010-08-28 LAB — CULTURE, BLOOD (ROUTINE X 2)
Culture: NO GROWTH
Culture: NO GROWTH

## 2010-08-28 LAB — LACTIC ACID, PLASMA: Lactic Acid, Venous: 1.7 mmol/L (ref 0.5–2.2)

## 2010-08-28 LAB — DIFFERENTIAL
Basophils Relative: 0 % (ref 0–1)
Eosinophils Relative: 3 % (ref 0–5)
Lymphocytes Relative: 26 % (ref 12–46)
Lymphs Abs: 1.6 10*3/uL (ref 0.7–4.0)
Monocytes Absolute: 0.6 10*3/uL (ref 0.1–1.0)
Monocytes Absolute: 0.7 10*3/uL (ref 0.1–1.0)
Monocytes Relative: 10 % (ref 3–12)
Neutro Abs: 4.8 10*3/uL (ref 1.7–7.7)

## 2010-08-28 LAB — URINALYSIS, MICROSCOPIC ONLY
Glucose, UA: NEGATIVE mg/dL
Hgb urine dipstick: NEGATIVE
Specific Gravity, Urine: 1.026 (ref 1.005–1.030)
pH: 6 (ref 5.0–8.0)

## 2010-08-28 LAB — CLOSTRIDIUM DIFFICILE EIA

## 2010-08-28 LAB — MAGNESIUM: Magnesium: 2.2 mg/dL (ref 1.5–2.5)

## 2010-08-28 LAB — CBC
HCT: 30.2 % — ABNORMAL LOW (ref 39.0–52.0)
MCHC: 33.6 g/dL (ref 30.0–36.0)
MCV: 88.9 fL (ref 78.0–100.0)
MCV: 89.3 fL (ref 78.0–100.0)
Platelets: 175 10*3/uL (ref 150–400)
Platelets: ADEQUATE 10*3/uL (ref 150–400)
RBC: 3.4 MIL/uL — ABNORMAL LOW (ref 4.22–5.81)
RDW: 17.9 % — ABNORMAL HIGH (ref 11.5–15.5)
WBC: 6.5 10*3/uL (ref 4.0–10.5)
WBC: 6.7 10*3/uL (ref 4.0–10.5)

## 2010-08-28 LAB — PROTIME-INR
INR: 1.1 (ref 0.00–1.49)
INR: 1.1 (ref 0.00–1.49)
Prothrombin Time: 15.3 seconds — ABNORMAL HIGH (ref 11.6–15.2)

## 2010-08-28 LAB — GIARDIA/CRYPTOSPORIDIUM SCREEN(EIA)

## 2010-08-28 LAB — RAPID URINE DRUG SCREEN, HOSP PERFORMED
Amphetamines: NOT DETECTED
Tetrahydrocannabinol: POSITIVE — AB

## 2010-08-28 LAB — STOOL CULTURE

## 2010-08-29 LAB — BASIC METABOLIC PANEL
BUN: 10 mg/dL (ref 6–23)
BUN: 5 mg/dL — ABNORMAL LOW (ref 6–23)
BUN: 6 mg/dL (ref 6–23)
BUN: 8 mg/dL (ref 6–23)
CO2: 22 mEq/L (ref 19–32)
CO2: 23 mEq/L (ref 19–32)
CO2: 23 mEq/L (ref 19–32)
CO2: 23 mEq/L (ref 19–32)
CO2: 24 mEq/L (ref 19–32)
CO2: 26 mEq/L (ref 19–32)
Calcium: 7.5 mg/dL — ABNORMAL LOW (ref 8.4–10.5)
Calcium: 7.7 mg/dL — ABNORMAL LOW (ref 8.4–10.5)
Calcium: 8.2 mg/dL — ABNORMAL LOW (ref 8.4–10.5)
Chloride: 106 mEq/L (ref 96–112)
Chloride: 106 mEq/L (ref 96–112)
Chloride: 114 mEq/L — ABNORMAL HIGH (ref 96–112)
Chloride: 115 mEq/L — ABNORMAL HIGH (ref 96–112)
Creatinine, Ser: 0.87 mg/dL (ref 0.4–1.5)
Creatinine, Ser: 1 mg/dL (ref 0.4–1.5)
Creatinine, Ser: 1.03 mg/dL (ref 0.4–1.5)
Creatinine, Ser: 1.07 mg/dL (ref 0.4–1.5)
GFR calc Af Amer: >60 mL/min (ref >60)
GFR calc Af Amer: >60 mL/min (ref >60)
GFR calc Af Amer: >60 mL/min (ref >60)
GFR calc non Af Amer: >60 mL/min (ref >60)
Glucose, Bld: 105 mg/dL — ABNORMAL HIGH (ref 70–99)
Glucose, Bld: 109 mg/dL — ABNORMAL HIGH (ref 70–99)
Glucose, Bld: 113 mg/dL — ABNORMAL HIGH (ref 70–99)
Glucose, Bld: 90 mg/dL (ref 70–99)
Glucose, Bld: 92 mg/dL (ref 70–99)
Potassium: 3.3 mEq/L — ABNORMAL LOW (ref 3.5–5.1)
Potassium: 3.4 mEq/L — ABNORMAL LOW (ref 3.5–5.1)
Potassium: 3.6 mEq/L (ref 3.5–5.1)
Potassium: 4 mEq/L (ref 3.5–5.1)
Sodium: 136 mEq/L (ref 135–145)
Sodium: 140 mEq/L (ref 135–145)

## 2010-08-29 LAB — DIFFERENTIAL
Basophils Absolute: 0 10*3/uL (ref 0.0–0.1)
Basophils Relative: 0 % (ref 0–1)
Eosinophils Relative: 4 % (ref 0–5)
Lymphocytes Relative: 12 % (ref 12–46)
Lymphs Abs: 1.6 10*3/uL (ref 0.7–4.0)
Monocytes Absolute: 1.4 10*3/uL — ABNORMAL HIGH (ref 0.1–1.0)
Neutrophils Relative %: 74 % (ref 43–77)

## 2010-08-29 LAB — URINE CULTURE
Colony Count: NO GROWTH
Special Requests: NEGATIVE

## 2010-08-29 LAB — URINALYSIS, ROUTINE W REFLEX MICROSCOPIC
Glucose, UA: NEGATIVE mg/dL
Ketones, ur: NEGATIVE mg/dL
Protein, ur: NEGATIVE mg/dL
Urobilinogen, UA: 0.2 mg/dL (ref 0.0–1.0)

## 2010-08-29 LAB — CBC
HCT: 28.3 % — ABNORMAL LOW (ref 39.0–52.0)
HCT: 29.2 % — ABNORMAL LOW (ref 39.0–52.0)
HCT: 30.3 % — ABNORMAL LOW (ref 39.0–52.0)
HCT: 30.4 % — ABNORMAL LOW (ref 39.0–52.0)
HCT: 30.5 % — ABNORMAL LOW (ref 39.0–52.0)
Hemoglobin: 10.3 g/dL — ABNORMAL LOW (ref 13.0–17.0)
Hemoglobin: 10.5 g/dL — ABNORMAL LOW (ref 13.0–17.0)
Hemoglobin: 9.8 g/dL — ABNORMAL LOW (ref 13.0–17.0)
MCHC: 33.6 g/dL (ref 30.0–36.0)
MCHC: 33.7 g/dL (ref 30.0–36.0)
MCHC: 34.5 g/dL (ref 30.0–36.0)
MCHC: 34.5 g/dL (ref 30.0–36.0)
MCHC: 34.6 g/dL (ref 30.0–36.0)
MCHC: 34.7 g/dL (ref 30.0–36.0)
MCHC: 34.7 g/dL (ref 30.0–36.0)
MCV: 89.2 fL (ref 78.0–100.0)
MCV: 89.7 fL (ref 78.0–100.0)
MCV: 89.9 fL (ref 78.0–100.0)
MCV: 90.2 fL (ref 78.0–100.0)
Platelets: 227 10*3/uL (ref 150–400)
Platelets: 259 10*3/uL (ref 150–400)
Platelets: 299 10*3/uL (ref 150–400)
Platelets: 313 10*3/uL (ref 150–400)
Platelets: 317 10*3/uL (ref 150–400)
RBC: 3.18 MIL/uL — ABNORMAL LOW (ref 4.22–5.81)
RBC: 3.3 MIL/uL — ABNORMAL LOW (ref 4.22–5.81)
RBC: 3.37 MIL/uL — ABNORMAL LOW (ref 4.22–5.81)
RDW: 16.1 % — ABNORMAL HIGH (ref 11.5–15.5)
RDW: 16.3 % — ABNORMAL HIGH (ref 11.5–15.5)
RDW: 16.5 % — ABNORMAL HIGH (ref 11.5–15.5)
RDW: 16.6 % — ABNORMAL HIGH (ref 11.5–15.5)
RDW: 16.7 % — ABNORMAL HIGH (ref 11.5–15.5)
RDW: 16.8 % — ABNORMAL HIGH (ref 11.5–15.5)
WBC: 11 10*3/uL — ABNORMAL HIGH (ref 4.0–10.5)
WBC: 12.8 10*3/uL — ABNORMAL HIGH (ref 4.0–10.5)
WBC: 13.8 10*3/uL — ABNORMAL HIGH (ref 4.0–10.5)

## 2010-08-29 LAB — MAGNESIUM
Magnesium: 1.6 mg/dL (ref 1.5–2.5)
Magnesium: 1.7 mg/dL (ref 1.5–2.5)
Magnesium: 1.9 mg/dL (ref 1.5–2.5)

## 2010-08-29 LAB — CLOSTRIDIUM DIFFICILE EIA: C difficile Toxins A+B, EIA: NEGATIVE

## 2010-08-29 LAB — CULTURE, BLOOD (ROUTINE X 2)
Culture: NO GROWTH
Culture: NO GROWTH

## 2010-08-29 LAB — PROTIME-INR
INR: 2.6 — ABNORMAL HIGH (ref 0.00–1.49)
INR: 2.7 — ABNORMAL HIGH (ref 0.00–1.49)
INR: 3.1 — ABNORMAL HIGH (ref 0.00–1.49)
Prothrombin Time: 30.9 seconds — ABNORMAL HIGH (ref 11.6–15.2)
Prothrombin Time: 31.8 seconds — ABNORMAL HIGH (ref 11.6–15.2)
Prothrombin Time: 34.1 seconds — ABNORMAL HIGH (ref 11.6–15.2)

## 2010-08-29 LAB — FECAL LACTOFERRIN, QUANT

## 2010-08-29 LAB — URINE MICROSCOPIC-ADD ON

## 2010-08-30 LAB — TSH
TSH: 3.222 u[IU]/mL (ref 0.350–4.500)
TSH: 2.392 u[IU]/mL (ref 0.350–4.500)

## 2010-08-30 LAB — LIPID PANEL
HDL: 17 mg/dL — ABNORMAL LOW (ref >39)
LDL Cholesterol: 72 mg/dL (ref 0–99)
Total CHOL/HDL Ratio: 6 RATIO
VLDL: 13 mg/dL (ref 0–40)

## 2010-08-30 LAB — URINE CULTURE: Colony Count: >=100000

## 2010-08-30 LAB — COMPREHENSIVE METABOLIC PANEL
ALT: 14 U/L (ref 0–53)
ALT: 15 U/L (ref 0–53)
AST: 21 U/L (ref 0–37)
CO2: 23 mEq/L (ref 19–32)
CO2: 19 mEq/L (ref 19–32)
Calcium: 8.9 mg/dL (ref 8.4–10.5)
Calcium: 9.4 mg/dL (ref 8.4–10.5)
Chloride: 105 mEq/L (ref 96–112)
Creatinine, Ser: 3.61 mg/dL — ABNORMAL HIGH (ref 0.4–1.5)
GFR calc Af Amer: 30 mL/min — ABNORMAL LOW (ref >60)
GFR calc non Af Amer: 17 mL/min — ABNORMAL LOW (ref >60)
GFR calc non Af Amer: 25 mL/min — ABNORMAL LOW (ref >60)
Glucose, Bld: 110 mg/dL — ABNORMAL HIGH (ref 70–99)
Glucose, Bld: 113 mg/dL — ABNORMAL HIGH (ref 70–99)
Sodium: 137 mEq/L (ref 135–145)
Sodium: 138 mEq/L (ref 135–145)
Total Bilirubin: 1.1 mg/dL (ref 0.3–1.2)
Total Bilirubin: 1.3 mg/dL — ABNORMAL HIGH (ref 0.3–1.2)

## 2010-08-30 LAB — CBC
HCT: 35.1 % — ABNORMAL LOW (ref 39.0–52.0)
HCT: 32.7 % — ABNORMAL LOW (ref 39.0–52.0)
HCT: 33.3 % — ABNORMAL LOW (ref 39.0–52.0)
Hemoglobin: 11.1 g/dL — ABNORMAL LOW (ref 13.0–17.0)
Hemoglobin: 11.3 g/dL — ABNORMAL LOW (ref 13.0–17.0)
Hemoglobin: 12.3 g/dL — ABNORMAL LOW (ref 13.0–17.0)
Hemoglobin: 16.3 g/dL (ref 13.0–17.0)
MCHC: 34.5 g/dL (ref 30.0–36.0)
MCHC: 33.9 g/dL (ref 30.0–36.0)
MCHC: 34.7 g/dL (ref 30.0–36.0)
MCV: 91.7 fL (ref 78.0–100.0)
MCV: 91.9 fL (ref 78.0–100.0)
Platelets: 147 10*3/uL — ABNORMAL LOW (ref 150–400)
Platelets: 133 10*3/uL — ABNORMAL LOW (ref 150–400)
RBC: 3.83 MIL/uL — ABNORMAL LOW (ref 4.22–5.81)
RBC: 3.58 MIL/uL — ABNORMAL LOW (ref 4.22–5.81)
RBC: 3.8 MIL/uL — ABNORMAL LOW (ref 4.22–5.81)
RBC: 5.12 MIL/uL (ref 4.22–5.81)
RDW: 16.4 % — ABNORMAL HIGH (ref 11.5–15.5)
RDW: 15.1 % (ref 11.5–15.5)
RDW: 15.9 % — ABNORMAL HIGH (ref 11.5–15.5)
WBC: 12.4 10*3/uL — ABNORMAL HIGH (ref 4.0–10.5)
WBC: 9.8 10*3/uL (ref 4.0–10.5)
WBC: 11.3 10*3/uL — ABNORMAL HIGH (ref 4.0–10.5)
WBC: 7 10*3/uL (ref 4.0–10.5)

## 2010-08-30 LAB — CULTURE, BLOOD (ROUTINE X 2)

## 2010-08-30 LAB — BASIC METABOLIC PANEL
BUN: 37 mg/dL — ABNORMAL HIGH (ref 6–23)
BUN: 55 mg/dL — ABNORMAL HIGH (ref 6–23)
BUN: 11 mg/dL (ref 6–23)
BUN: 26 mg/dL — ABNORMAL HIGH (ref 6–23)
CO2: 23 mEq/L (ref 19–32)
CO2: 20 mEq/L (ref 19–32)
Calcium: 8.1 mg/dL — ABNORMAL LOW (ref 8.4–10.5)
Calcium: 8.4 mg/dL (ref 8.4–10.5)
Calcium: 8.5 mg/dL (ref 8.4–10.5)
Calcium: 8.6 mg/dL (ref 8.4–10.5)
Chloride: 104 mEq/L (ref 96–112)
Creatinine, Ser: 1.82 mg/dL — ABNORMAL HIGH (ref 0.4–1.5)
Creatinine, Ser: 2.82 mg/dL — ABNORMAL HIGH (ref 0.4–1.5)
GFR calc Af Amer: >60 mL/min (ref >60)
GFR calc non Af Amer: 38 mL/min — ABNORMAL LOW (ref >60)
GFR calc non Af Amer: 37 mL/min — ABNORMAL LOW (ref >60)
GFR calc non Af Amer: 51 mL/min — ABNORMAL LOW (ref >60)
GFR calc non Af Amer: 58 mL/min — ABNORMAL LOW (ref >60)
Glucose, Bld: 85 mg/dL (ref 70–99)
Glucose, Bld: 98 mg/dL (ref 70–99)
Potassium: 4 mEq/L (ref 3.5–5.1)
Potassium: 3.6 mEq/L (ref 3.5–5.1)
Potassium: 4.2 mEq/L (ref 3.5–5.1)
Sodium: 140 mEq/L (ref 135–145)

## 2010-08-30 LAB — APTT
aPTT: 32 seconds (ref 24–37)
aPTT: 33 seconds (ref 24–37)
aPTT: 36 seconds (ref 24–37)
aPTT: 41 seconds — ABNORMAL HIGH (ref 24–37)
aPTT: 44 seconds — ABNORMAL HIGH (ref 24–37)

## 2010-08-30 LAB — BRAIN NATRIURETIC PEPTIDE: Pro B Natriuretic peptide (BNP): 148 pg/mL — ABNORMAL HIGH (ref 0.0–100.0)

## 2010-08-30 LAB — PROTIME-INR
INR: 5 — ABNORMAL HIGH (ref 0.00–1.49)
INR: 1.4 (ref 0.00–1.49)
INR: 1.7 — ABNORMAL HIGH (ref 0.00–1.49)
INR: 2.1 — ABNORMAL HIGH (ref 0.00–1.49)
INR: 2.1 — ABNORMAL HIGH (ref 0.00–1.49)
INR: 2.7 — ABNORMAL HIGH (ref 0.00–1.49)
Prothrombin Time: 51.4 seconds — ABNORMAL HIGH (ref 11.6–15.2)
Prothrombin Time: 18.1 seconds — ABNORMAL HIGH (ref 11.6–15.2)
Prothrombin Time: 20.4 seconds — ABNORMAL HIGH (ref 11.6–15.2)
Prothrombin Time: 24.6 seconds — ABNORMAL HIGH (ref 11.6–15.2)

## 2010-08-30 LAB — DIGOXIN LEVEL: Digoxin Level: 1.2 ng/mL (ref 0.8–2.0)

## 2010-08-30 LAB — DIFFERENTIAL
Basophils Absolute: 0 10*3/uL (ref 0.0–0.1)
Basophils Absolute: 0 10*3/uL (ref 0.0–0.1)
Basophils Relative: 0 % (ref 0–1)
Eosinophils Absolute: 0 10*3/uL (ref 0.0–0.7)
Eosinophils Absolute: 0.1 10*3/uL (ref 0.0–0.7)
Eosinophils Relative: 1 % (ref 0–5)
Lymphocytes Relative: 4 % — ABNORMAL LOW (ref 12–46)
Lymphs Abs: 2.1 10*3/uL (ref 0.7–4.0)
Neutrophils Relative %: 87 % — ABNORMAL HIGH (ref 43–77)
Neutrophils Relative %: 70 % (ref 43–77)

## 2010-08-30 LAB — RAPID URINE DRUG SCREEN, HOSP PERFORMED
Amphetamines: NOT DETECTED
Barbiturates: NOT DETECTED

## 2010-08-30 LAB — CARDIAC PANEL(CRET KIN+CKTOT+MB+TROPI)
CK, MB: 0.8 ng/mL (ref 0.3–4.0)
CK, MB: 0.8 ng/mL (ref 0.3–4.0)
CK, MB: 0.9 ng/mL (ref 0.3–4.0)
Relative Index: INVALID (ref 0.0–2.5)
Relative Index: INVALID (ref 0.0–2.5)
Relative Index: INVALID (ref 0.0–2.5)
Total CK: 73 U/L (ref 7–232)
Total CK: 81 U/L (ref 7–232)
Troponin I: <0.01 ng/mL (ref 0.00–0.06)
Troponin I: <0.01 ng/mL (ref 0.00–0.06)

## 2010-08-30 LAB — URINE MICROSCOPIC-ADD ON

## 2010-08-30 LAB — CHLAMYDIA PROBE AMPLIFICATION, URINE: Chlamydia, Swab/Urine, PCR: NEGATIVE

## 2010-08-30 LAB — POCT CARDIAC MARKERS
CKMB, poc: <1 ng/mL — ABNORMAL LOW (ref 1.0–8.0)
CKMB, poc: <1 ng/mL — ABNORMAL LOW (ref 1.0–8.0)
Myoglobin, poc: >500 ng/mL (ref 12–200)
Myoglobin, poc: 168 ng/mL (ref 12–200)
Troponin i, poc: <0.05 ng/mL (ref 0.00–0.09)

## 2010-08-30 LAB — URINALYSIS, ROUTINE W REFLEX MICROSCOPIC
Glucose, UA: NEGATIVE mg/dL
Ketones, ur: NEGATIVE mg/dL
Ketones, ur: 15 mg/dL — AB
Protein, ur: 30 mg/dL — AB
Urobilinogen, UA: 1 mg/dL (ref 0.0–1.0)
pH: 5.5 (ref 5.0–8.0)

## 2010-08-30 LAB — LEGIONELLA ANTIGEN, URINE

## 2010-08-30 LAB — LIPASE, BLOOD
Lipase: 16 U/L (ref 11–59)
Lipase: 47 U/L (ref 11–59)

## 2010-08-30 LAB — EXPECTORATED SPUTUM ASSESSMENT W GRAM STAIN, RFLX TO RESP C

## 2010-08-30 LAB — CULTURE, RESPIRATORY W GRAM STAIN

## 2010-08-30 LAB — CK TOTAL AND CKMB (NOT AT ARMC): Total CK: 68 U/L (ref 7–232)

## 2010-08-30 LAB — TROPONIN I: Troponin I: 0.02 ng/mL (ref 0.00–0.06)

## 2010-08-30 LAB — ETHANOL: Alcohol, Ethyl (B): <5 mg/dL (ref 0–10)

## 2010-09-02 ENCOUNTER — Emergency Department (HOSPITAL_COMMUNITY)
Admission: EM | Admit: 2010-09-02 | Discharge: 2010-09-02 | Disposition: A | Discharge status: Home / Self Care | Payer: Medicare Other | Attending: Emergency Medicine | Admitting: Emergency Medicine

## 2010-09-02 ENCOUNTER — Emergency Department (HOSPITAL_COMMUNITY): Payer: Medicare Other

## 2010-09-02 DIAGNOSIS — J438 Other emphysema: Secondary | ICD-10-CM | POA: Insufficient documentation

## 2010-09-02 DIAGNOSIS — I10 Essential (primary) hypertension: Secondary | ICD-10-CM | POA: Insufficient documentation

## 2010-09-02 DIAGNOSIS — K219 Gastro-esophageal reflux disease without esophagitis: Secondary | ICD-10-CM | POA: Insufficient documentation

## 2010-09-02 DIAGNOSIS — Z7901 Long term (current) use of anticoagulants: Secondary | ICD-10-CM | POA: Insufficient documentation

## 2010-09-02 DIAGNOSIS — R51 Headache: Secondary | ICD-10-CM | POA: Insufficient documentation

## 2010-09-02 DIAGNOSIS — Z950 Presence of cardiac pacemaker: Secondary | ICD-10-CM | POA: Insufficient documentation

## 2010-09-02 DIAGNOSIS — S060X0A Concussion without loss of consciousness, initial encounter: Secondary | ICD-10-CM | POA: Insufficient documentation

## 2010-09-02 DIAGNOSIS — Y92009 Unspecified place in unspecified non-institutional (private) residence as the place of occurrence of the external cause: Secondary | ICD-10-CM | POA: Insufficient documentation

## 2010-09-02 DIAGNOSIS — I4891 Unspecified atrial fibrillation: Secondary | ICD-10-CM | POA: Insufficient documentation

## 2010-09-02 DIAGNOSIS — S61409A Unspecified open wound of unspecified hand, initial encounter: Secondary | ICD-10-CM | POA: Insufficient documentation

## 2010-09-02 DIAGNOSIS — I509 Heart failure, unspecified: Secondary | ICD-10-CM | POA: Insufficient documentation

## 2010-09-02 DIAGNOSIS — Z79899 Other long term (current) drug therapy: Secondary | ICD-10-CM | POA: Insufficient documentation

## 2010-09-02 DIAGNOSIS — J3489 Other specified disorders of nose and nasal sinuses: Secondary | ICD-10-CM | POA: Insufficient documentation

## 2010-09-02 DIAGNOSIS — IMO0002 Reserved for concepts with insufficient information to code with codable children: Secondary | ICD-10-CM | POA: Insufficient documentation

## 2010-09-03 LAB — HEPARIN LEVEL (UNFRACTIONATED)
Heparin Unfractionated: 0.26 IU/mL — ABNORMAL LOW (ref 0.30–0.70)
Heparin Unfractionated: 0.32 IU/mL (ref 0.30–0.70)
Heparin Unfractionated: <0.1 IU/mL — ABNORMAL LOW (ref 0.30–0.70)

## 2010-09-03 LAB — CBC
HCT: 40.7 % (ref 39.0–52.0)
HCT: 40.8 % (ref 39.0–52.0)
HCT: 43 % (ref 39.0–52.0)
HCT: 56 % — ABNORMAL HIGH (ref 39.0–52.0)
Hemoglobin: 13.7 g/dL (ref 13.0–17.0)
Hemoglobin: 15.6 g/dL (ref 13.0–17.0)
Hemoglobin: 15.6 g/dL (ref 13.0–17.0)
Hemoglobin: 18.1 g/dL — ABNORMAL HIGH (ref 13.0–17.0)
MCHC: 32.4 g/dL (ref 30.0–36.0)
MCHC: 32.8 g/dL (ref 30.0–36.0)
MCHC: 33.3 g/dL (ref 30.0–36.0)
MCHC: 33.7 g/dL (ref 30.0–36.0)
MCV: 92.5 fL (ref 78.0–100.0)
MCV: 94 fL (ref 78.0–100.0)
Platelets: 156 10*3/uL (ref 150–400)
Platelets: 175 10*3/uL (ref 150–400)
Platelets: 188 10*3/uL (ref 150–400)
Platelets: 192 10*3/uL (ref 150–400)
Platelets: 193 10*3/uL (ref 150–400)
RBC: 4.01 MIL/uL — ABNORMAL LOW (ref 4.22–5.81)
RBC: 5.13 MIL/uL (ref 4.22–5.81)
RBC: 5.95 MIL/uL — ABNORMAL HIGH (ref 4.22–5.81)
RDW: 14.4 % (ref 11.5–15.5)
RDW: 14.8 % (ref 11.5–15.5)
RDW: 14.9 % (ref 11.5–15.5)
RDW: 15.2 % (ref 11.5–15.5)
WBC: 11.7 10*3/uL — ABNORMAL HIGH (ref 4.0–10.5)
WBC: 8.3 10*3/uL (ref 4.0–10.5)
WBC: 9.9 10*3/uL (ref 4.0–10.5)

## 2010-09-03 LAB — STOOL CULTURE

## 2010-09-03 LAB — CULTURE, BLOOD (ROUTINE X 2): Culture: NO GROWTH

## 2010-09-03 LAB — PROTIME-INR
INR: 1.4 (ref 0.00–1.49)
INR: 1.6 — ABNORMAL HIGH (ref 0.00–1.49)
INR: 2.8 — ABNORMAL HIGH (ref 0.00–1.49)
Prothrombin Time: 15.5 seconds — ABNORMAL HIGH (ref 11.6–15.2)
Prothrombin Time: 17.3 seconds — ABNORMAL HIGH (ref 11.6–15.2)
Prothrombin Time: 19.6 seconds — ABNORMAL HIGH (ref 11.6–15.2)
Prothrombin Time: 21.4 seconds — ABNORMAL HIGH (ref 11.6–15.2)
Prothrombin Time: 32 seconds — ABNORMAL HIGH (ref 11.6–15.2)

## 2010-09-03 LAB — PREPARE FRESH FROZEN PLASMA

## 2010-09-03 LAB — BASIC METABOLIC PANEL
BUN: 13 mg/dL (ref 6–23)
BUN: 13 mg/dL (ref 6–23)
CO2: 24 mEq/L (ref 19–32)
Calcium: 9.1 mg/dL (ref 8.4–10.5)
Calcium: 9.3 mg/dL (ref 8.4–10.5)
Chloride: 108 mEq/L (ref 96–112)
Creatinine, Ser: 1.04 mg/dL (ref 0.4–1.5)
GFR calc Af Amer: >60 mL/min (ref >60)
GFR calc non Af Amer: 48 mL/min — ABNORMAL LOW (ref >60)
Glucose, Bld: 95 mg/dL (ref 70–99)
Potassium: 4.6 mEq/L (ref 3.5–5.1)
Potassium: 4.8 mEq/L (ref 3.5–5.1)
Sodium: 136 mEq/L (ref 135–145)

## 2010-09-03 LAB — RAPID URINE DRUG SCREEN, HOSP PERFORMED
Benzodiazepines: NOT DETECTED
Cocaine: NOT DETECTED
Opiates: POSITIVE — AB
Tetrahydrocannabinol: POSITIVE — AB

## 2010-09-03 LAB — COMPREHENSIVE METABOLIC PANEL
ALT: 16 U/L (ref 0–53)
ALT: 20 U/L (ref 0–53)
Alkaline Phosphatase: 119 U/L — ABNORMAL HIGH (ref 39–117)
BUN: 15 mg/dL (ref 6–23)
CO2: 23 mEq/L (ref 19–32)
Calcium: 9 mg/dL (ref 8.4–10.5)
Creatinine, Ser: 1.28 mg/dL (ref 0.4–1.5)
GFR calc Af Amer: >60 mL/min (ref >60)
GFR calc non Af Amer: 42 mL/min — ABNORMAL LOW (ref >60)
Glucose, Bld: 103 mg/dL — ABNORMAL HIGH (ref 70–99)
Glucose, Bld: 95 mg/dL (ref 70–99)
Potassium: 4 mEq/L (ref 3.5–5.1)
Sodium: 142 mEq/L (ref 135–145)
Sodium: 144 mEq/L (ref 135–145)
Total Bilirubin: 1.3 mg/dL — ABNORMAL HIGH (ref 0.3–1.2)
Total Protein: 6.4 g/dL (ref 6.0–8.3)

## 2010-09-03 LAB — URINE MICROSCOPIC-ADD ON

## 2010-09-03 LAB — PHOSPHORUS: Phosphorus: 4 mg/dL (ref 2.3–4.6)

## 2010-09-03 LAB — URINALYSIS, ROUTINE W REFLEX MICROSCOPIC
Glucose, UA: NEGATIVE mg/dL
Ketones, ur: 40 mg/dL — AB
Leukocytes, UA: NEGATIVE
Nitrite: NEGATIVE
Protein, ur: 100 mg/dL — AB
Urobilinogen, UA: 0.2 mg/dL (ref 0.0–1.0)

## 2010-09-03 LAB — CK TOTAL AND CKMB (NOT AT ARMC)
CK, MB: 2 ng/mL (ref 0.3–4.0)
Relative Index: INVALID (ref 0.0–2.5)
Total CK: 74 U/L (ref 7–232)

## 2010-09-03 LAB — DIFFERENTIAL
Basophils Absolute: 0 10*3/uL (ref 0.0–0.1)
Basophils Relative: 0 % (ref 0–1)
Eosinophils Absolute: 0 10*3/uL (ref 0.0–0.7)
Neutro Abs: 8.7 10*3/uL — ABNORMAL HIGH (ref 1.7–7.7)
Neutrophils Relative %: 74 % (ref 43–77)

## 2010-09-03 LAB — CLOSTRIDIUM DIFFICILE EIA: C difficile Toxins A+B, EIA: NEGATIVE

## 2010-09-03 LAB — CARDIAC PANEL(CRET KIN+CKTOT+MB+TROPI)
CK, MB: 1.9 ng/mL (ref 0.3–4.0)
CK, MB: 1.9 ng/mL (ref 0.3–4.0)
Relative Index: INVALID (ref 0.0–2.5)
Relative Index: INVALID (ref 0.0–2.5)
Total CK: 74 U/L (ref 7–232)
Troponin I: 0.02 ng/mL (ref 0.00–0.06)
Troponin I: 0.05 ng/mL (ref 0.00–0.06)
Troponin I: <0.01 ng/mL (ref 0.00–0.06)
Troponin I: <0.01 ng/mL (ref 0.00–0.06)

## 2010-09-03 LAB — ETHANOL: Alcohol, Ethyl (B): 7 mg/dL (ref 0–10)

## 2010-09-03 LAB — TROPONIN I: Troponin I: 0.02 ng/mL (ref 0.00–0.06)

## 2010-09-03 LAB — LIPASE, BLOOD: Lipase: 42 U/L (ref 11–59)

## 2010-09-04 ENCOUNTER — Ambulatory Visit (INDEPENDENT_AMBULATORY_CARE_PROVIDER_SITE_OTHER): Payer: Medicare Other | Admitting: Internal Medicine

## 2010-09-04 ENCOUNTER — Encounter: Payer: Self-pay | Admitting: Internal Medicine

## 2010-09-04 VITALS — BP 116/80 | HR 93 | Temp 98.7°F | Ht 74.0 in | Wt 161.1 lb

## 2010-09-04 DIAGNOSIS — H9209 Otalgia, unspecified ear: Secondary | ICD-10-CM

## 2010-09-04 DIAGNOSIS — Z7901 Long term (current) use of anticoagulants: Secondary | ICD-10-CM

## 2010-09-04 DIAGNOSIS — I10 Essential (primary) hypertension: Secondary | ICD-10-CM

## 2010-09-04 DIAGNOSIS — I4891 Unspecified atrial fibrillation: Secondary | ICD-10-CM

## 2010-09-04 LAB — HEPARIN LEVEL (UNFRACTIONATED): Heparin Unfractionated: <0.1 IU/mL — ABNORMAL LOW (ref 0.30–0.70)

## 2010-09-04 LAB — CBC
Hemoglobin: 12.3 g/dL — ABNORMAL LOW (ref 13.0–17.0)
MCHC: 33.4 g/dL (ref 30.0–36.0)
RBC: 3.92 MIL/uL — ABNORMAL LOW (ref 4.22–5.81)
RDW: 14.6 % (ref 11.5–15.5)
WBC: 10.8 10*3/uL — ABNORMAL HIGH (ref 4.0–10.5)
WBC: 9.5 10*3/uL (ref 4.0–10.5)

## 2010-09-04 LAB — BASIC METABOLIC PANEL
Calcium: 9.1 mg/dL (ref 8.4–10.5)
GFR calc Af Amer: >60 mL/min (ref >60)
GFR calc non Af Amer: >60 mL/min (ref >60)
Glucose, Bld: 131 mg/dL — ABNORMAL HIGH (ref 70–99)
Sodium: 140 mEq/L (ref 135–145)

## 2010-09-04 LAB — PROTIME-INR
INR: 1.6 — ABNORMAL HIGH (ref 0.00–1.49)
INR: 1.6 — ABNORMAL HIGH (ref 0.00–1.49)
Prothrombin Time: 19.9 seconds — ABNORMAL HIGH (ref 11.6–15.2)

## 2010-09-04 LAB — POCT INR: INR: 3.1

## 2010-09-04 MED ORDER — HYDROCODONE-ACETAMINOPHEN 5-500 MG PO TABS: 2.0000 | ORAL_TABLET | Freq: Four times a day (QID) | ORAL | Status: DC | PRN

## 2010-09-04 NOTE — Assessment & Plan Note (Signed)
Stable, continue to monitor  ?
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
## 2010-09-04 NOTE — Assessment & Plan Note (Signed)
Rate controlled, on Coumadin, check his INR in 3 weeks, I checked it today, is 3.1, that is acceptable I will not make any adjustments and refer the patient back to Dr. Acquanetta Belling.

## 2010-09-10 ENCOUNTER — Other Ambulatory Visit (INDEPENDENT_AMBULATORY_CARE_PROVIDER_SITE_OTHER): Payer: Medicare Other | Admitting: *Deleted

## 2010-09-10 DIAGNOSIS — I81 Portal vein thrombosis: Secondary | ICD-10-CM

## 2010-09-10 DIAGNOSIS — Z7901 Long term (current) use of anticoagulants: Secondary | ICD-10-CM

## 2010-09-10 DIAGNOSIS — I4891 Unspecified atrial fibrillation: Secondary | ICD-10-CM

## 2010-09-10 MED ORDER — WARFARIN SODIUM 5 MG PO TABS: ORAL_TABLET | ORAL | Status: DC

## 2010-09-10 NOTE — Telephone Encounter (Signed)
I refilled the warfarin.  I will forward the request for the Multaq refill to patient's cardiologist Dr. Johney Frame.

## 2010-09-11 ENCOUNTER — Telehealth: Payer: Self-pay | Admitting: Internal Medicine

## 2010-09-11 NOTE — Telephone Encounter (Signed)
Pt needs refill on maltaq 400mg  bid not sure what pharm because pt didn't call this over

## 2010-09-12 MED ORDER — DRONEDARONE HCL 400 MG PO TABS: 400.0000 mg | ORAL_TABLET | Freq: Two times a day (BID) | ORAL | Status: DC

## 2010-09-18 ENCOUNTER — Other Ambulatory Visit: Payer: Self-pay | Admitting: *Deleted

## 2010-09-18 DIAGNOSIS — H9209 Otalgia, unspecified ear: Secondary | ICD-10-CM

## 2010-09-19 ENCOUNTER — Encounter: Payer: Self-pay | Admitting: Internal Medicine

## 2010-09-20 MED ORDER — OXYCODONE-ACETAMINOPHEN 10-650 MG PO TABS: 1.0000 | ORAL_TABLET | Freq: Four times a day (QID) | ORAL | Status: DC | PRN

## 2010-09-21 ENCOUNTER — Encounter: Payer: Self-pay | Admitting: *Deleted

## 2010-09-21 ENCOUNTER — Ambulatory Visit (INDEPENDENT_AMBULATORY_CARE_PROVIDER_SITE_OTHER): Payer: Medicare Other | Admitting: Internal Medicine

## 2010-09-21 ENCOUNTER — Other Ambulatory Visit: Payer: Self-pay | Admitting: Internal Medicine

## 2010-09-21 ENCOUNTER — Encounter: Payer: Self-pay | Admitting: Internal Medicine

## 2010-09-21 DIAGNOSIS — I495 Sick sinus syndrome: Secondary | ICD-10-CM

## 2010-09-21 DIAGNOSIS — F17200 Nicotine dependence, unspecified, uncomplicated: Secondary | ICD-10-CM

## 2010-09-21 DIAGNOSIS — I4891 Unspecified atrial fibrillation: Secondary | ICD-10-CM

## 2010-09-21 DIAGNOSIS — I10 Essential (primary) hypertension: Secondary | ICD-10-CM

## 2010-09-21 DIAGNOSIS — I81 Portal vein thrombosis: Secondary | ICD-10-CM

## 2010-09-21 LAB — HEPATIC FUNCTION PANEL
AST: 15 U/L (ref 0–37)
Total Bilirubin: 1 mg/dL (ref 0.3–1.2)

## 2010-09-21 LAB — BASIC METABOLIC PANEL
CO2: 27 mEq/L (ref 19–32)
Calcium: 8.8 mg/dL (ref 8.4–10.5)
Glucose, Bld: 99 mg/dL (ref 70–99)
Potassium: 4 mEq/L (ref 3.5–5.1)
Sodium: 145 mEq/L (ref 135–145)

## 2010-09-21 NOTE — Telephone Encounter (Signed)
Pharmacy was called and informed that pt is no longer on the Prozac.

## 2010-09-21 NOTE — Assessment & Plan Note (Signed)
Continue longterm anticoagulation with coumadin Continue multaq We will check LFTs and BMET today

## 2010-09-21 NOTE — Assessment & Plan Note (Signed)
Cessation advised He is not ready to quit 

## 2010-09-21 NOTE — Patient Instructions (Signed)
Your physician wants you to follow-up in: 6 months. You will receive a reminder letter in the mail two months in advance. If you don't receive a letter, please call our office to schedule the follow-up appointment.  Your physician recommends that you return for lab work today. B-met and Liver profile.

## 2010-09-21 NOTE — Progress Notes (Signed)
The patient presents today for routine electrophysiology followup.  Since last being seen in our clinic, the patient reports doing very well.  He denies any recent symptoms of afib.  He remains very active.  He is awaiting colonoscopy for further workup of possible rectal Ca.  Today, he denies symptoms of palpitations, chest pain, shortness of breath, orthopnea, PND, lower extremity edema, dizziness, presyncope, syncope, or neurologic sequela.  The patient feels that he is tolerating medications without difficulties and is otherwise without complaint today.   Past Medical History  Diagnosis Date  . Atrial fibrillation with rapid ventricular response   . Hypertension   . Renal insufficiency     Renal U/S 12/04/2009 showed no pathological findings. Labs 12/04/2009 include normal ESR, C3, C4; neg ANA; SPEP showed nonspecific increase in the alpha-2 region with no M-spike; UPEP showed no monoclonal free light chains; urine IFE showed polyclonal increase in feree Kappa and/or free Lambda light chains.  . Chronic systolic heart failure   . Osteoarthritis   . Spondylosis   . Chronic pain   . Depression   . GERD (gastroesophageal reflux disease)   . Portal vein thrombosis   . Avascular necrosis     right hip s/p replacement  . 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 Gastritis   . Alcohol abuse   . Tobacco abuse   . Erectile dysfunction   . Bipolar disorder   . Hydrocele, unspecified   . Intermittent claudication   . Edema   . Lung nodule   . 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  Thyroid scan 08/28/2009 was normal with no focal areas of abnormal increased or decreased activity seen; the uptake of I 131 sodium iodide at 24 hours was 5.7%.  TSH and free T4 normalized by 08/16/2009.  [name] Pneumonia   . Intestinal obstruction   . Chronic systolic heart failure   . Other primary cardiomyopathies     2D echo on 02/06/2010 showed normal  LV size with mild global hypokinesis, estimated EF 45%; mild biatrial enlargement; mild pulmonary hypertension (PA systolic pressure 42 mmHg assuming RA pressure 10 mmHg); normal RV size with mild systolic dysfunction.  [name] COPD (chronic obstructive pulmonary disease)   . Atrial fibrillation     paroxysmal, failed medical therapy with amiodarone but has done very well with multaq  . Clostridium difficile colitis   . E coli bacteremia   . Cluster headaches   . DVT (deep venous thrombosis)     he has hypercoagulability with multiple occluded venous vessels   Past Surgical History  Procedure Date  . Left hip bipolar hemiarthroplasty 09/09/2002    By Dr. Kristeen Miss  . Right total hip arthroplasty 03/08/2008    By Dr. Charlesetta Shanks  . Pacemaker insertion October 2004    By Dr. Charolette Child, gen change by [facility]    Current Outpatient Prescriptions  Medication Sig Dispense Refill  . albuterol (PROVENTIL HFA) 108 (90 BASE) MCG/ACT inhaler Inhale 2 puffs into the lungs every 6 (six) hours as needed for wheezing or shortness of breath.  1 Inhaler  6  . diltiazem (CARDIZEM CD) 240 MG 24 hr capsule Take 240 mg by mouth 2 (two) times daily.        [name] dronedarone (MULTAQ) 400 MG tablet Take 1 tablet (400 mg total) by mouth 2 (two) times daily with a meal.  60 tablet  6  . FLUoxetine (PROZAC)  20 MG capsule Take 20 mg by mouth daily.        [name] HYDROcodone-acetaminophen (VICODIN) 5-500 MG per tablet Take 2 tablets by mouth every 6 (six) hours as needed for pain.  20 tablet  0  . mirtazapine (REMERON) 15 MG tablet Take 1 tablet (15 mg total) by mouth at bedtime.  30 tablet  2  . omeprazole (PRILOSEC) 20 MG capsule Take 2 capsules (40 mg total) by mouth daily.  60 capsule  6  . oxyCODONE-acetaminophen (PERCOCET) 10-650 MG per tablet Take 1 tablet by mouth every 6 (six) hours as needed for pain.  120 tablet  0  . warfarin (COUMADIN) 5 MG tablet Take as directed.  60 tablet  1  . DISCONTD: polyethylene glycol  (GLYCOLAX) powder Take 17 g by mouth daily.        [name] DISCONTD: SUMAtriptan (IMITREX) 50 MG tablet Take 50 mg by mouth. Once you feel your headache is approaching; may take the second tablet 2 hours later         Allergies  Allergen Reactions  . Penicillins     REACTION: breakout    History   Social History  . Marital Status: Widowed    Spouse Name: N/A    Number of Children: 10  . Years of Education: N/A   Occupational History  . RETIRED    Social History Main Topics  . Smoking status: Current Everyday Smoker -- 0.5 packs/day for 40 years    Types: Cigarettes  . Smokeless tobacco: Not on file  . Alcohol Use: No     former heavy use-none since last year  . Drug Use: No  . Sexually Active: Not on file   Other Topics Concern  . Not on file   Social History Narrative   Works as a Media planner part time    Family History  Problem Relation Age of Onset  . Hypertension Mother   . Cancer Brother     Unsure of type.  . Asthma Mother   . Coronary artery disease Mother   . Colon cancer Neg Hx   . Lung cancer Neg Hx   . Prostate cancer Neg Hx     Physical Exam: Filed Vitals:   09/21/10 1229  BP: 134/87  Pulse: 78  Height: 6\' 3"  (1.905 m)  Weight: 163 lb (73.936 kg)    GEN- The patient is well appearing, alert and oriented x 3 today.   Head- normocephalic, atraumatic Eyes-  Sclera clear, conjunctiva pink Ears- hearing intact Oropharynx- clear Neck- supple, no JVP Lymph- no cervical lymphadenopathy Lungs- Clear to ausculation bilaterally, normal work of breathing Chest- R sided pacemaker pocket is well healed Heart- Regular rate and rhythm, no murmurs, rubs or gallops, PMI not laterally displaced GI- soft, NT, ND, + BS Extremities- no clubbing, cyanosis, or edema MS- no significant deformity or atrophy Skin- no rash or lesion Psych- euthymic mood, full affect Neuro- strength and sensation are intact  Pacemaker interrogation- reviewed in detail today,  See  PACEART report  Assessment and Plan:

## 2010-09-21 NOTE — Assessment & Plan Note (Signed)
He has multiple venous occlusions likely due to hypercoagulability. Continue coumadin longterm.

## 2010-09-21 NOTE — Telephone Encounter (Signed)
Review of chart show that patient was changed from Prozac to Remeron in March.  Please find out whether he Is he back on Prozac, and if he is still taking the Remeron?

## 2010-09-21 NOTE — Assessment & Plan Note (Signed)
Normal pacemaker function See Pace Art report No changes today  

## 2010-09-21 NOTE — Telephone Encounter (Signed)
Call to pt said that he is on Remeron.   He said that he stopped the Prozac.   Said that he forgot to mention this to Dr. Johney Frame today.  Said that he told him he was still on the Prozac but is not.

## 2010-09-21 NOTE — Assessment & Plan Note (Signed)
Stable No change required today  

## 2010-09-26 ENCOUNTER — Telehealth: Payer: Self-pay | Admitting: *Deleted

## 2010-09-26 NOTE — Telephone Encounter (Signed)
I have reviewed extensive medical records in [facility] and in [facility] Reason for colon: thickened rectum On CT scan 06/2010, also thickened ileum on CT in 2010. We saw him During the hospital admission 06/2010, then he was scheduled with [facility], Dr Bosie Clos for colon.and now he is back to be scheduled with [place] again. He has a hx of use of narcotics so he will need Propofol.Since he was already evaluated once for colo within last 8 weeks,, he may be a direct schedule with Propofol at [facility]. Hold Coumadin as per DR Allred. ----- Message ----- From: Daphine Deutscher, RN Sent: 09/26/2010 9:46 AM To: Hart Carwin, Judie Petit

## 2010-09-26 NOTE — Telephone Encounter (Signed)
Received a call from [facility] with outpatient clinic. Patient finally got in to see his cardiologist. Patient called her and told her that he has been cleared for colonoscopy. Patient is bringing her the note to the clinic today and she will fax a copy of it to [place]. Does patient need office visit prior to colonoscopy?

## 2010-09-26 NOTE — Telephone Encounter (Signed)
Received a call from

## 2010-09-27 ENCOUNTER — Telehealth: Payer: Self-pay | Admitting: *Deleted

## 2010-09-27 ENCOUNTER — Encounter: Payer: Self-pay | Admitting: *Deleted

## 2010-09-27 NOTE — Telephone Encounter (Signed)
Addended by: Jesse Fall on: 09/27/2010 01:10 PM   Modules accepted: Orders

## 2010-09-27 NOTE — Telephone Encounter (Signed)
Reviewed and agree. Stop Coumadin prior to colon as per cardiology recomm. ----- Message ----- From: Daphine Deutscher, RN Sent: 09/27/2010 1:10 PM To: Hart Carwin, MD

## 2010-09-27 NOTE — Telephone Encounter (Signed)
Spoke with patient and gave him the dates and times for pre visit and procedure. Letter mailed to patient for pre visit.

## 2010-09-27 NOTE — Telephone Encounter (Signed)
Unable to reach patient as his phone number. Called and left a message for Joyce Gross at outpatient clinic to call me .

## 2010-09-27 NOTE — Telephone Encounter (Signed)
Scheduled patient for direct propofol colonoscopy on 11/06/10 at 9:00 AM with 8:00 AM arrival. Pre visit scheduled on 10/29/10 at 9:00 AM. Letter sent to Dr. Johney Frame re: Coumadin management.

## 2010-09-27 NOTE — Telephone Encounter (Signed)
Spoke with patient and gave him appointment date and times for pre visit and colonoscopy. Letter mailed to patient about pre visit.

## 2010-10-02 NOTE — Consult Note (Signed)
[name], [name] NO.:  [id number]   MEDICAL RECORD NO.:  [id number]          PATIENT TYPE:  INP   LOCATION:  2607                         FACILITY:  [facility]   PHYSICIAN:  [name]. Bensimhon, MDDATE OF BIRTH:  1946-01-25   DATE OF CONSULTATION:  12/26/2008  DATE OF DISCHARGE:                                 CONSULTATION   PRIMARY CARDIOLOGIST:  [name], [name].   REASON FOR CONSULTATION:  Atrial fibrillation with rapid ventricular  response.   HISTORY OF PRESENT ILLNESS:  [name] is a very complicated 65-year-  old male with multiple medical problems including COPD with ongoing  tobacco use, diabetes, atrial fibrillation and flutter complicated by  tachybrady syndrome.  He is status post a pacemaker placement.  He has  been followed by [name].  He has had a previous attempt at atrial  flutter ablation, but this was aborted due to a lack of venous access.   I saw him back in March when he was admitted with pneumonia complicated  by atrial fibrillation with rapid ventricular response, and he was  treated with amiodarone with good response.  Since that time, he has had  several admissions for various illnesses.  Most recently, he was  admitted from December 12, 2008, to December 19, 2008, with a probable  hospital-acquired pneumonia.  This was once again complicated by atrial  fibrillation by rapid ventricular response.  Although it was difficult,  they finally got control of his heart rate by increasing his Diltiazem  to 480 mg a day and continuing his amiodarone.  He was seen in the  [facility] on December 21, 2008.  At that time, he  once again had a mild rapid ventricular response with a heart rate of  108 in the setting of not increasing his Cardizem to 480 mg a day as  instructed.   He was readmitted on August 5th with abdominal pain and felt to have an  ileus.  He was NPO for several days.  During this time, his ventricular  response once again increased.  He was treated briefly with IV Cardizem  as well as IV Diltiazem with heart rates in the 110 to 120 [name].  His  IV drips were stopped and he was started on p.o.'s.  Today, he was back  up with a heart rate to 140 to 150.  He did not respond to several  boluses of Cardizem.  He did get 1 bolus of IV amiodarone this evening  and now is heart rate is in the 110 [name].   He denies any palpitations, no dyspnea, and no chest pain.  He is having  bowel movements and now able to eat with his resolving ileus.   He denies any orthopnea, no PND, no lower extremity edema.  He did have  abdominal pain and nausea and vomiting, which has now resolved.  The  remainder of the review of systems is negative except for HPI and  Problem List.   PROBLEM LIST:  1. 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  a.     Status post failed atrial flutter ablation due to lack of       venous access.      b.     Maintained on amiodarone and Cardizem at home.  2. COPD with ongoing tobacco use.  3. 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  4. Diabetes.  5. Hypertension.  6. Recent hospital-acquired pneumonia.  7. Gastroesophageal reflux disease.  8. Avascular necrosis of both hips  9. Normal ejection fraction by echo.  10.History of a bipolar disorder.  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  12.History of polysubstance abuse.  13.Peripheral vascular disease.   CURRENT MEDICATIONS:  Include Diltiazem 480 mg a day, which was just  reinitiated, Advair, heparin, vancomycin, meropenem, Protonix,  potassium, Coumadin, albuterol, Atrovent.   ALLERGIES:  PENICILLIN.   SOCIAL HISTORY:  He lives alone, he is separated, he has been separated  for a long time.  He continues to smoke.  He denies alcohol or drugs.   FAMILY HISTORY:  Noncontributory.   PHYSICAL EXAMINATION:  GENERAL:  He is lying flat  in bed in no acute  distress.  VITAL SIGNS:  Blood pressure is 130/90, heart rate is currently 115, he  is afebrile.  HEENT:  Normal except for poor dentition.  NECK:  Supple.  JVP is about 8 cm of water.  Carotids are 2+ bilaterally  without bruits.  There is no lymphadenopathy or thyromegaly.  CARDIAC:  PMI is not palpable.  He is irregularly irregular with distant  heart sounds, no obvious murmurs.  LUNGS:  Clear with decreased air  movement throughout, no wheezing.  ABDOMEN:  Obese but soft, nontender, and nondistended.  He has good  bowel sounds.  EXTREMITIES:  Warm with no cyanosis, clubbing, or edema, no rash.  NEURO:  Alert and oriented x3.  Cranial nerves II-XII are grossly  intact.  He moves all 4 extremities without difficulty.  Affect is  pleasant.   BMET:  Sodium is 141, potassium 3.2, glucose of 93, BUN of 3, creatinine  of 0.94.  CBC:  White count 9.0, hemoglobin 12.4, platelets of 197, INR  is 1.8.  Telemetry monitoring shows atrial fibrillation with a rapid  ventricular response, the rate initially about 140 now in the 110 to 120  [name] after IV amiodarone.   ASSESSMENT:  1. Chronic atrial fibrillation with rapid ventricular response not      responding well to intravenous Diltiazem.  2. Ileus, resolved.  3. Hospital-acquired pneumonia.  4. Chronic obstructive pulmonary disease with ongoing tobacco use.  5. Hypokalemia.   PLANS/DISCUSSION:  I think the best option here is to place [name]  back on IV amiodarone for improved rate control as he is not responding  to Cardizem.  Hopefully, we will be able to wean this back as he  reestablishes good oral intake.  We may have to increase his oral  amiodarone for awhile if necessary.  We will continue to follow with  you.  We appreciate the consult.  Agree with continuing heparin and  bridging to Coumadin.      [name]. Bensimhon, [name]  Electronically Signed     DRB/MEDQ  D:  12/26/2008  T:  12/26/2008  Job:   [id number]

## 2010-10-02 NOTE — Discharge Summary (Signed)
[name], WOLD NO.:  [id number]   MEDICAL RECORD NO.:  [id number]          PATIENT TYPE:  INP   LOCATION:  2607                         FACILITY:  [facility]   PHYSICIAN:  [name], M.D.    DATE OF BIRTH:  [birth year]   DATE OF ADMISSION:  12/22/2008  DATE OF DISCHARGE:                               DISCHARGE SUMMARY   Date of Admission: 12/12/08  Date of Discharge: 12/19/08   DISCHARGE DIAGNOSES:  1. Chronic obstructive pulmonary disease exacerbation, stable under      control at time of discharge.  2. Hospital-acquired pneumonia.  The patient receiving IV antibiotics      at time of discharge.  3. Atrial fibrillation/atrial flutter with rapid ventricular response      back under control on medical therapy at time of discharge.  4. Acute renal failure, improved at time of discharge.  5. Tobacco abuse.   DISCHARGE MEDICATIONS:  1. Prevacid 30 mg by mouth daily.  2. Aspirin 81 mg by mouth daily.  3. Percocet 10-650 mg by mouth once every 6-8 hours as needed for      pain.  4. Advair Diskus 250/50 inhale 1 puff 2 times a day.  5. Warfarin sodium 5 mg by mouth daily.  6. MS Contin 15 mg 2 tablets by mouth twice daily.  7. Amiodarone 200 mg by mouth daily.  8. Promethazine 12.5 mg by mouth 3 times a day as needed for nausea      and vomiting.  9. Cardizem LA 480 mg by mouth daily.  10.Ipratropium bromide 0.02% solution, inhale 500 mcg nebulized every      6 hours.  11.NicoDerm CQ 14 mg per 24 hours, apply one patch to skin before bed,      change patch daily.  12.Albuterol sulfate 0.083% nebulized inhaled 2.5 mg nebulizer every 6      hours as needed for shortness of breath.  13.Vancomycin 1000 mg IV every 12 hours until December 23, 2008.  14.Merrem 1 g IV every 12 hours until December 23, 2008.   DISPOSITION AND FOLLOWUP:  The patient was discharged to home in stable  condition with appropriate follow up scheduled with Dr. Theotis Barrio at [facility] on December 27, 2008, at 9:30 a.m.  At that  appointment, Dr. Theotis Barrio can check a BMET to evaluate his renal function,  inquire about his breathing symptoms and medication adherence, and  inquire about recurrence of palpitations and evaluate his heart rate.  No other specific labs necessarily need to be checked.   PROCEDURES PERFORMED:  1. Chest x-ray from December 12, 2008, showed no acute findings in the      chest, chronic fibrothorax and parenchymal scarring on the left as      well as cardiomegaly.  2. CT angio of the chest on December 14, 2008, showed no evidence of      pulmonary embolism; showed air space disease in the posterior right      upper lobe that may represent pneumonia; showed bilateral pleural      calcifications as  well as a nodular opacity at the left lung base,      which most likely representing scarring; showed slightly prominent      thyroid gland and showed cardiomegaly.  3. Chest x-ray from December 15, 2008, showed placement of his left-sided      PICC line with the tip in the distal superior vena cava, but no      other changes.   CONSULTATIONS:  None.   BRIEF ADMITTING HISTORY AND PHYSICAL:  The patient is a 65 year old male  with past medical history including atrial fibrillation with RVR, sick  sinus syndrome, coagulopathy, and bilateral hip avascular necrosis who  was brought to the ED by EMS after he started to have shortness of  breath.  For the last 1 or 2 days, he has been having shortness of  breath.  He also had one episode of brief chest pain at rest.  No  nausea, vomiting, or diarrhea.  The patient denies any fevers, chills,  or diaphoresis.  The patient has some cough with occasional clear  sputum.  He has taken all his medications including his inhalers.  The  patient states, he has also taken his cardiac medications including  Cardizem and amiodarone.  No recent sick contacts or travel history.  The patient did not take his Coumadin yesterday.  He  called the clinic  and he was told to dial 911, which he did.   PAST MEDICAL HISTORY:  1. Atrial fibrillation with RVR.  2. Clostridium difficile colitis.  3. Escherichia coli bacteremia.  4. Bilateral pleural plaques.  5. Cluster headaches.  6. Chronic obstructive pulmonary disease.  7. Major depression.  8. Erectile dysfunction.  9. Polysubstance abuse.  10.Gastritis.  11.Sick sinus syndrome.  12.Coagulopathy.  13.Degenerative disk disease.  14.Bilateral hip avascular necrosis.  15.Hypertension.  16.Osteoarthritis.   PHYSICAL EXAMINATION:  On day of admission:  VITAL SIGNS:  Temperature 98.2, pulse 109, blood pressure 118/91, and O2  sats 99% on room air.  GENERAL:  The patient is in no acute distress.  Talking in full  sentences without oxygen to maintain the oxygen saturation.  CHEST:  Bilateral scattered rhonchi, occasional rest with expiratory  wheeze.  CARDIOVASCULAR:  Regular rate and rhythm.  No murmurs, rubs, or gallops.  ABDOMEN:  Soft and nontender.  Bowel sounds normal.  No organomegaly.   LABORATORIES:  On day of admission:  White count 8.2, hemoglobin 16.2, hematocrit 48.3, and platelets 202.  Protime 14.1, INR 1.1.  Sodium 143, potassium 4.9, chloride 111, CO2 of  22, glucose 84, BUN 30, creatinine 2.17, total bili 1.3, alk phos 51,  AST 20, ALT 22, total protein 7.2, albumin 3.4, and calcium 9.4.   HOSPITAL COURSE:  1. COPD exacerbation.  The patient had bilateral rhonchi in his      initial exam and was having some difficulty breathing.  He was      started back on all of his COPD medications including and nebulized      Atrovent, nebulized albuterol p.r.n., and his Advair.  Also, his      metoprolol was stopped.  His initial shortness of breath also      included a workup for a possible pulmonary embolus in the context      of the patient being off of his Coumadin and having a baseline      coagulopathy.  CT angio of the chest showed no evidence of       pulmonary embolus, however, it  did show a possible hospital-      acquired pneumonia.  Regarding his COPD, his lungs cleared by      hospital day 3 on his COPD medications and a prednisone taper.  2. Hospital-acquired pneumonia.  The patient was found to have      posterior right upper lobe air space disease on CT angio done to      rule out pulmonary embolus.  Due to the fact that he had been      hospitalized several times in recent weeks, he was treated for      hospital-acquired pneumonia.  He was started on vancomycin IV and      imipenem IV.  By the time of discharge, the patient had a PICC line      placed, so that he could receive continued IV antibiotics as an      outpatient.  At this time, his imipenem was changed to meropenem      due to decreased frequency of dosing.  He remained afebrile and he      had a mild white count in the context of being on steroids.  3. AFib/Aflutter with RVR.  By hospital day #2, the patient became      very tachycardiac into the 140s.  This was attributed to stopping      his metoprolol, which we did as a supplemental treatment for his      COPD.  In order to achieve better control of his heart rate, we      sequentially increased his dose of long-acting diltiazem and      continued his dose of amiodarone.  By the day of discharge, the      patient's diltiazem was at a daily rate of 480 mg, which is maximum      dose.  His heart rate was controlled.  The patient was treated with      Coumadin on a daily basis in order to get his INR back into a      therapeutic range.  4. Acute renal failure.  The patient came in with an elevated      creatinine of 2.17.  He was rehydrated with IV fluids over the      course of his stay and his creatinine responded appropriately      dropping back into the normal range by his time of discharge.  5. Tobacco abuse.  The patient is a continued smoker.  He was treated      with nicotine patches while inpatient and  discharged with nicotine      patches.   DISCHARGE DAY VITAL SIGNS:  Temperature 98.6, systolic blood pressure  142, diastolic 82, pulse rate 84, respirations 20, and sating 98% on  room air.   DISCHARGE LABORATORIES:  White blood cells 13.9, hemoglobin 12.4,  hematocrit 36.3, and platelets 175.  Sodium 137, potassium 4.6, chloride  107, bicarb 29, BUN 23, creatinine 1.05, and glucose 108.  T. bili 1.3,  alk phos 51, AST 20, ALT 22, albumin 3.4, and calcium 8.3.      Brooks Sailors, MD  Electronically Signed      [name], M.D.  Electronically Signed    KS/MEDQ  D:  12/23/2008  T:  12/24/2008  Job:  [id number]   cc:   Brooks Sailors, MD

## 2010-10-02 NOTE — Discharge Summary (Signed)
NAME:  [name], [name] NO.:  [id number]   MEDICAL RECORD NO.:  [id number]          PATIENT TYPE:  INP   LOCATION:  1428                         FACILITY:  [facility]   PHYSICIAN:  Deidre Ala, M.D.    DATE OF BIRTH:  09-09-1945   DATE OF ADMISSION:  03/08/2008  DATE OF DISCHARGE:  03/18/2008                               DISCHARGE SUMMARY   ATTENDING PHYSICIAN:  1. Charlesetta Shanks, M.D.   FINAL DIAGNOSES:  1. Avascular necrosis, right hip with chronic pain.  2. Resolving ileus.  3. Postoperative blood loss anemia.  4. Chronic obstructive pulmonary disease.  5. Hypertension.  6. History of atrial fibrillation.  7. Ongoing tobacco use.   PROCEDURES:  March 10, 2008:  Total hip arthroplasty, right hip with  DePuy ASR metal-on-metal.   HISTORY:  This is a 65 year old African American male followed by Dr.  Renae Fickle for chronic hip pain.  Workup was done and he was noted to have  significant AVN, grade I-II of the right hip.  The patient had failed  medical management and was now ready for a total hip arthroplasty.   HOSPITAL COURSE:  The patient was admitted to [facility] on March 10, 2008 and underwent a total hip arthroplasty.  The patient tolerated  the procedure well.  No intraoperative complication occurred.  Postoperatively, the patient did well initially after few postoperative  days, but after that he developed an ileus.  This was most likely  secondary to pain medicines.  He also had some diarrhea which was  paradoxical in nature from the ileus, although the patient was on Cipro  for 3 days for a urinary tract infection that he had prior to admission.  This was discontinued at day 3.  He continued to work with Physical  Therapy and was a little slow secondary to the ileus to move.  The hip  continued to bother him, but he was given pain management for this.  He  did cut back on pain medicines and then his ileus began to resolve.  Dr.  Algie Coffer came in to  see the patient because he did develop an atrial  fibrillation which did resolve while he was in the hospital.  He  continued to be followed by Dr. Algie Coffer throughout his stay.  Then, Dr.  Elnoria Howard was consulted from GI because of the ileus.  He saw the patient the  day we did KUB x-rays on the patient.  The patient's symptoms did  resolve by the time of discharge.  On March 18, 2008, the patient was  ready to be discharged to [facility].  He basically lives  alone and has no help at home, so we will need a place to manage him  until he can walk without difficulty.  He will continue with rehab at  that point.   DISCHARGE MEDICATIONS:  At the time of discharge, his medications were:  1. Megace 200 mg b.i.d. for poor appetite.  2. Protonix 20 mg daily.  3. He was on Advair 1 puff t.i.d.  4. He was on  Cardizem 108 mg b.i.d. for hypertension.  5. He was put on a nicotine patch at 20 mg daily for his smoking      history.  6. He was on Carvedilol 12.5 mg b.i.d. with meals.  7. He was on Coumadin adjusted for INR to be in the realm of 2.5.  He      will continue on Coumadin as an outpatient.  8. He was on digoxin 0.25 mg a day.  9. He is still on Percocet 5 mg 1-2 p.o. q. 4-6 hours for pain.  He      was given 50 of these at the time of discharge.  10.He was given Skelaxin 750 mg 1 p.o. q.i.d. p.r.n. for muscle      spasms.  If he needs a rolling walker, he was given a prescription      for a rolling walker.   The patient subsequently was transferred to [facility] for  continued rehabilitation.  He will follow up with Dr. Renae Fickle in 10 days.  He is discharged at this time in satisfactory, stable condition.      Phineas Semen, Wynonia Hazard, M.D.  Electronically Signed    CL/MEDQ  D:  03/18/2008  T:  03/18/2008  Job:  [id number]

## 2010-10-02 NOTE — H&P (Signed)
NAME:  [name], [name]                ACCOUNT NO.:  [id number]   MEDICAL RECORD NO.:  [id number]          PATIENT TYPE:  INP   LOCATION:  2014                         FACILITY:  [facility]   PHYSICIAN:  [name], M.D.  DATE OF BIRTH:  09-05-1945   DATE OF ADMISSION:  06/15/2008  DATE OF DISCHARGE:                              HISTORY & PHYSICAL   CHIEF COMPLAINT:  Palpitation.   HISTORY OF PRESENT ILLNESS:  This is a 65 year old black male who has a  1-day history of palpitation.  He also came for a check on his [employer]  pacemaker [phone], which showed need to replace the battery.  The  patient's monitor showed a heart rate of 140 with atrial fibrillation.   PAST MEDICAL HISTORY:  1. Negative for diabetes.  2. Positive for smoking.  3. History of chronic obstructive lung disease.  4. Positive for atrial flutter/fibrillation.  5. Gastroesophageal reflux disease.  6. Depression.   PAST SURGICAL HISTORY:  Pacemaker replacement in 2006 by Dr. Charolette Child, superior mesenteric vein thrombosis treatment, right pulmonary  node surgery, and left hip surgery in 2009.   CURRENT MEDICATIONS:  1. Metoprolol 100 mg 3 times daily.  2. Lasix 40 mg Monday, Wednesday, Friday.  3. Potassium 10 mEq daily.  4. Diltiazem 180 mg twice daily.  5. Lanoxin 0.125 mg daily.  6. Folic acid 1 mg daily.  7. Albuterol metered-dose inhaler 2 puffs 4 times daily as needed.  8. Ambien 5 mg at bedtime.  9. Coumadin 7.5 mg daily.  10.MS Contin 60 mg twice daily.  11.Advair 250/50 one puff twice daily.  12.Oxycodone/APAP 10/650 mg twice daily.   ALLERGIES:  PENICILLIN causing rash.   FAMILY HISTORY:  Mother died of asthma and myocardial infarction in her  42s.  Father died in his 35s.   SOCIAL HISTORY:  The patient is separated for 35 years and lives alone.   REVIEW OF SYSTEMS:  The patient denies recent weight gain, weight loss.  He wears reading glasses.  He has dentures.  He has history of chronic  obstructive lung disease.  He has history of chest pain, recurrent  diarrhea.  History of arthritis.  No history of stroke, seizures, or  psychiatric admissions.   PHYSICAL EXAMINATION:  VITAL SIGNS:  Pulse 140, respirations 18, and  blood pressure 90/50.  GENERAL:  The patient is a well-built, averagely-nourished black male in  no significant distress.  HEENT:  The patient is normocephalic and atraumatic with brown eyes,  wears glasses.  Ears, nose, and throat grossly unremarkable.  NECK:  No JVD.  No carotid bruit.  LUNGS:  Clear bilaterally.  HEART:  Normal S1 and S2 with grade 2/6 systolic murmur.  ABDOMEN:  Soft and nontender.  EXTREMITIES:  No edema, cyanosis, or clubbing.  SKIN:  Warm and dry.  CNS:  Cranial nerves grossly intact.  The patient moves all 4  extremities.   LABORATORY DATA:  Pending.   ELECTROCARDIOGRAM:  Atrial fibrillation with rapid ventricular response.   IMPRESSION:  1. Atrial flutter/fibrillation with rapid ventricular response.  2. Chronic  obstructive lung disease.  3. Hypertension.  4. Chronic tobacco use disorder.  5. Status post single-chamber pacemaker with indication for      replacement of battery due to low voltage.   PLAN:  To admit the patient to the hospital.  Start IV Cardizem and  consult Dr. Lynnea Ferrier for pacemaker battery replacement.      [name], M.D.  Electronically Signed     ASK/MEDQ  D:  06/15/2008  T:  06/16/2008  Job:  [id number]

## 2010-10-02 NOTE — Op Note (Signed)
NAME:  [name], [name] NO.:  [id number]   MEDICAL RECORD NO.:  [id number]          PATIENT TYPE:  INP   LOCATION:  0004                         FACILITY:  [facility]   PHYSICIAN:  Deidre Ala, M.D.    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   PROCEDURE:  Right total hip arthroplasty using metal-on-metal DePuy  components, ASR cup with DePuy Summit stem.   SURGEON:  1. Charlesetta Shanks, M.D.   ASSISTANT:  Phineas Semen, P.A.   ANESTHESIA:  General with endotracheal.   CULTURES:  None.   DRAINS:  Two medium Hemovac to self suction.   ESTIMATED BLOOD LOSS:  300 mL.   REPLACED:  Without.   PATHOLOGIC FINDINGS AND HISTORY:  [name] was presented to [place] with  hip pain.  He was sent by Dr. Orpah Cobb.  He has some cardiac issues  also, [name].  He sees Dr. Valetta Close.  He has had a left bipolar hip in the past.  He has some pain there with  trochanteric bursitis and some groin pain, but this was apparently for  AVN on the right side.  He had a x-ray showing a wedge of avascular  necrosis and he had not collapsed, but he was having significant pain,  early OA, and at surgery the gross pathologic specimen which was not  sent for pathology did show evidence of AVN.  In any case, we replaced  him with DePuy components, and we used the ASR hip and used the 56  Porocoat, HA-coated ASR cup, _DePuySummit femoral stem, 12-14 taper  DuoFix and a size 6 standard 150-mm stem.  We used the DePuy ASR Uni  Femoral Implant size 49, a taper sleeve adapter 12-14 +5.  We  had no  evidence of any dislocation on external rotation in extension, no push-  pull instability and hip flexion with internal rotation going up to  about 85 degrees before dislocation.  Leg lengths appeared grossly  equal.   DESCRIPTION OF PROCEDURE:  With adequate anesthesia obtained using  endotracheal technique, 2 gm of Ancef given IV prophylaxis, the patient  was placed in the left lateral decubitus position with the right side  up.  After standard prepping and draping, an [name] type posterior  hip incision was made.  The incision was deepened sharply with a knife  and hemostasis obtained using the Bovie electrocoagulator.  Dissection  was carried down through the gluteus fascia and iliotibial band and a  retractor, Charnley type placed.  We then placed retractors around the  acetabulum, releasing the short external rotators.  I then cut and  tagged the piriformis and then released the capsule with tagging  stitches with a flap based medially.  The head was then dislocated and  using the template the neck was cut one fingerbreadth up from the lesser  trochanter.  We then exposed the acetabulum and did a lobectomy.  Successive reaming was carried up to a 55.  We trialed a 56, it did not  bottom out.  We then placed the 56 in, impacted it with the appropriate  geometry with excellent tight fit.  We then exposed the proximal femur  and successively reamed up to a 5, broached to a 5, but we could take a  6 because we were countersunk, broached to a 6 with the appropriate  retroversion and then trialed the components with a +2, then a +5,  feeling a +5 was better.  Trial femoral components were then removed.  We then implanted the femoral implant.  The 6 with the +5 head on size  49, impacted them both, irrigated and then articulated the hip through a  full range of motion.  Irrigation was carried out.  Hemovac drains were  placed deep after closing the piriformis and posterior  capsule with the  Ethibond tagging sutures back to the posterior trochanter.  The Hemovac  drain was placed and brought out distally.  The wound was then closed  with #1 Vicryl interrupted figure-of-eights on the iliotibial band and  gluteus fascia, 0 and 2-0 Vicryl on the subcu and skin staples.  Hemovac  was hooked up to self suction.  A bulky sterile compressive dressing was  applied with knee immobilizer.  Gentamicin per protocol was injected in  the wound through the Hemovac and the Hemovac was clamped and will be  charged at 2 hours to let the gentamicin circulate in the wound.  The  patient then having tolerated the procedure well was awakened and taken  to the recovery room in satisfactory condition to be admitted for  routine postoperative care and analgesia.      Deidre Ala, M.D.  Electronically Signed     VEP/MEDQ  D:  03/08/2008  T:  03/08/2008  Job:  [id number]   cc:   [name], M.D.  Fax: [phone]   Valetta Close, M.D.  Fax: [phone]

## 2010-10-02 NOTE — Discharge Summary (Signed)
NAME:  MAUI, BRITTEN NO.:  [id number]   MEDICAL RECORD NO.:  [id number]          PATIENT TYPE:  INP   LOCATION:  3709                         FACILITY:  [facility]   PHYSICIAN:  Waldemar Dickens, MD     DATE OF BIRTH:  [birth year]   DATE OF ADMISSION:  08/15/2008  DATE OF DISCHARGE:                               DISCHARGE SUMMARY   DISCHARGE DIAGNOSIS:  1. Escherichia coli bacteremia.  2. Atrial fibrillation with rapid ventricular rate.  3. Abdominal pain.   DISCHARGE MEDICATIONS:  1. Ciprofloxacin 500 mg b.i.d. for 3 days.  2. Coumadin 5 mg on Monday, Wednesday, and Friday and 2.5 mg every      other day.  3. Metoprolol 100 mg 1 pill b.i.d.  4. MS Contin 30 mg XR 1 pill b.i.d.  5. Percocet 10/650 one p.o. q.8 hours p.r.n. pain.  6. Cialis 10 mg 1 tablet 1 hour, and no more than 12 hours, before      planned sexual activity.  7. Digoxin 0.125 mg 1 pill once daily.  8. Prevacid 30 mg 1 pill once daily.  9. ProAir 2 puffs as needed.  10.Aspirin 81 mg 1 pill a day.  11.Lasix 40 mg 1 tablet Monday, Wednesday, and Friday.  12.Carbamazepine 25 mg 1 tablet q.6 p.r.n. nausea.   DISPOSITION AND FOLLOWUP:  CBC lab drawn on August 29, 2008 at 2 o'clock  p.m.  He will then go to have INR and Coumadin check also on August 29, 2008 at 2:45, then a hospital follow up with Dr. Comer Locket on September 01, 2008 at 3 o'clock p.m.  Here a CBC should be available to ensure whether  continued resolution of leukocytosis.  Additionally, the patient will be  assessed for completion of outpatient antibiotic treatment and to make  sure there is no evidence of recurrent bacteremia such as fever or other  systemic signs particularly pneumonia or dysuria.  Although there was no  evidence of diarrhea at discharge, the patient had personal biochemistry  of C. diff colitis, which should be ruled out with review of systems.  The patient's INR shots to be evaluated, given Cipro therapy, it may be  elevated despite the adjustment as per pharmacy recommendation at  discharge, and finally continue to monitor the patient's heart rate and  obtain EKG as necessary to ensure he has maintained regular rate and  rhythm and not returned to AFib.  Finally, Cardiology follow up with Dr.  Johney Frame in 1-2 days.   PROCEDURES PERFORMED:  No procedures were performed.   CONSULTATIONS:  Cardiology service was consulted.   BRIEF ADMITTING HISTORY AND PHYSICAL:  Mr. Elkhatib is a 65 year old  gentleman with history significant for CHF, chronic AFib on Coumadin and  recent hospital admission of March 9 through the 13th for AFib with  rapid ventricular rate and underwent a TEE followed by DC cardioversion.  He said he was doing well at discharge at this time for about a week,  then about 1 week later the patient began developing fever subjectively  chills, sweats, productive cough and whitish  phlegm, this was associated  with some anorexia and has noted sick contacts.  At this time, he also  had increased dyspnea on exertion and only able to walk 10-20 feet or he  normally could walk about 4 miles without feeling any signs of dyspnea.  He also noted a couple episodes, exactly 2, of chest pain on the day of  admission lasting 5-10 minutes not associated with dyspnea or  diaphoresis, resolved spontaneously and of note, he has a smoking  history of 1-2 packs per day.  Also at this time, he had pain with  urination and had a UTI diagnosis several months ago.   HOSPITAL COURSE:  UTI and pneumonia secondary to E. Coli bacteremia.  The patient was found to have E. Coli bacteremia although was afebrile  during this hospital course and was not complicated by signs of sepsis.  His initial antibiotic therapy was based on lab determined sensitivity  and where a ceftazidime was continued for 8 days and was replaced with  ciprofloxacin in anticipation of discharge for the past 3 days.  A 14-  day course is indicated,  remaining 3 days of antibiotic therapy would be  continued as an outpatient.   AFib with RVR:  The patient was given amiodarone 200 mg, which was  increased to 400 mg as well as diltiazem CD 20 mg was added to  cardiovert the patient to regular rate.  This treatment was modified as  per discharge instructions and Cardiology recommendation.  His last  telemetry reported AFib on August 23, 2008 with a rate of 86, otherwise  his pulse has been stable around 60 beats per minute.   Leukocytosis:  The patient's white blood cell count continued to  increase despite antibiotic treatment initially and also despite  resolution of symptoms.  At discharge, the patient's white blood cell  count was stable at 13.8, previously was 13.4 but down from a high of  15.7.   Abdominal Pain:  The patient has a history of chronic gastritis thought  to be related to alcohol use, but also has a history of C. diff colitis  and with nonradiating midepigastric pain and that was associated with  some tenderness and did improve over the course of his stay, that was  not associated with any nausea, vomiting, or diarrhea while here or  change in appetite.  C. diff toxins A and B, and stool lactoferrin.  Labs were taken and were negative and given the improvement over time,  no further evaluation was indicated, but should be followed up on  outpatient follow up.   DISCHARGE LABORATORY DATA AND VITALS:  VITALS:  Temperature was 97.9,  pulse was 59, respirations were 16, blood pressure was 135/68, O2  saturation was 95%.  In's and out's were 1240 in and 1200 out.   LABORATORY DATA:  PT/INR was 27.4/2.4, white blood cell was 13.8,  hemoglobin was 9.8, hematocrit 28.3, platelets were 360.  Sodium was  136, potassium 3.4, chloride 106, bicarb 25, BUN was 4, creatinine was  0.87 and glucose was 96.      Mariea Stable, MD  Electronically Signed      Waldemar Dickens, MD  Electronically Signed    MA/MEDQ  D:   08/24/2008  T:  08/25/2008  Job:  [id number]   cc:   Blondell Reveal, MD  Hillis Range, MD

## 2010-10-02 NOTE — Discharge Summary (Signed)
NAME:  [name], [name]                ACCOUNT NO.:  [id number]   MEDICAL RECORD NO.:  [id number]          PATIENT TYPE:  INP   LOCATION:  3730                         FACILITY:  [facility]   PHYSICIAN:  C. Ulyess Mort, M.D.DATE OF BIRTH:  1946-01-09   DATE OF ADMISSION:  12/22/2008  DATE OF DISCHARGE:  12/30/2008                               DISCHARGE SUMMARY   DISCHARGE DIAGNOSES:  1. Small bowel obstruction versus ileus, likely secondary to opioid      pain medication overuse.  2. Atrial fibrillation and flutter, complicated by tachy-brady      syndrome.  3. Chronic obstructive pulmonary disease with an ongoing tobacco use.  4. Diabetes.  5. Hypertension.  6. Recent hospital-acquired pneumonia.  7. Gastroesophageal reflux disease.  8. Avascular necrosis of both hips.  9. History of bipolar disorder.  10.History of recurrent deep vein thrombosis.  11.History of polysubstance abuse.  12.Peripheral vascular disease.   DISCHARGE MEDICATIONS:  1. Multaq 400 mg 1 tablet by mouth b.i.d.  2. Percocet 10/650 mg 1 tablet p.o. q.8 h. as needed for pain,      dispensed #120.  The patient has been instructed on a very cautious      use of any opioid medication.  3. Colace 100 mg 1 tablet by mouth twice daily.  4. Senokot 8.6 mg 1 tablet twice daily.  5. Cardizem XL 480 mg p.o. daily.  6. Coumadin 5 mg p.o. daily, dose per pharmacy.  7. Prevacid 30 mg 1 p.o. daily.  8. Aspirin 81 mg 1 p.o. daily.  9. Advair Diskus 250-50 one puff 2 times daily.  10.NicoDerm CQ 14 mg 1 patch to skin transdermally q.24 h.  The      patient has been instructed not to smoke concomitantly.  11.Atrovent 2 puffs b.i.d.  12.Ventolin 2 puffs q.i.d. p.r.n.   DISPOSITION:  Improved/stable.   The patient is to follow up with:  1. Dr. Michaell Cowing , PharmD [facility] on January 02, 2009, at      2:15 p.m.  2. The patient is to follow up with Dr. Aleene Davidson at the [facility]patient      [facility] on January 09, 2009, at 2  p.m.  3. The patient is to follow up with Dr. Johney Frame, cardiologist.  His      office is to contact the patient.   FOLLOWUP INSTRUCTIONS:  Please check his heart rate and blood pressure.  The patient tends to run between 100 to 130 beats per minute.  Please  reinforce the cautious use of opioids given recent episode of small  bowel obstruction/ileus, and please note that the patient exhibits drug-  seeking behavior.   PROCEDURE PERFORMED DURING THE HOSPITAL COURSE:  1. Chest x-ray 2-view on December 21, 2008.  Impression:  No acute      finding in the patient with extensive pleural calcifications on the      left.  2. X-ray of abdomen 2 views.  Impression:  Marked distention of small      bowel loops with associated wall thickening, prominent air-fluid  levels identified within the bowel.  Findings are compatible with      high-grade small bowel obstruction.  No intraabdominal free air      seen.  Large amount of stool noted within the colon.  The colon is      difficult to fully assess on this study.  3. CT scan of abdomen on December 22, 2008.  Impression:  Marked      dilatation of small and large bowel loops.  The colon is completely      filled with stool.  There is a partial fecalization of the distal      ileum.  Question of a thickened small bowel loop in the proximal-to-      mid ileum.  Marked thickening of anorectal wall, relatively diffuse      and symmetric in nature, most likely reflect chronic proctitis,      secondary to extended impaction of stool.  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  Small somewhat loculated right-sided pleural effusion with      right lower lobe atelectasis.  Minimal emphysematous changes within      the right lung.  Diffuse pleural calcifications along the left lung      base, with associated rounded atelectasis and partial left lower      lobe collapse.  Just clinical correlation for  prior asbestosis      exposure.  4. Repeated abdomen x-ray 1-view on December 24, 2008.  Impression:      Slight increase in gas-filled small bowel loops, suggest evolving      ileus.  The colonic stool volume seen on yester's film appears      decreased in the interval.   CONSULTATIONS:  1. Hillis Range, MD, Cardiology.  2. Juanetta Gosling, MD, with Surgery.   BRIEF ADMITTING HISTORY AND PHYSICAL:  Mr. Brodersen is a 65 year old male  with an extensive past medical history who presents as a transfer from  [facility] ED with the complaints of hurting allover.  He was  recently discharged home from [facility] on December 19, 2008, following admission for COPD exacerbation and potential hospital-  acquired pneumonia.  This is his fourth admission for this month.  The  patient has been discharged home with a PICC line for IV vancomycin and  meropenem, for empiric treatment for hospital-acquired pneumonia.  After  receiving his first home dose of vancomycin, the patient states that he  began to feel bad with body aches allover.  He also noted that he had  increased generalized weakness and he also reports lack of bowel  movements for 13 days.  He denied any fever, chills, chest pain,  shortness of breath, dysuria, hematuria, hematochezia, nausea, vomiting,  or poor appetite.  The patient did note that he had an increase in his  chronic low back pain with subsequent increase in use of Percocet and MS  Contin.   ALLERGIES:  PENICILLIN.   PAST MEDICAL HISTORY:  1. Atrial flutter with AFib with RVR, with pacemaker placement in 2004      and revision in January 2010.  2. 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  3. 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  4. History of C. diff colitis in  January 2010, for which he has been      treated with vancomycin  p.o. for 10 days.  5. History of E. coli bacteremia in March 2010, treated with Cipro.  6. Bilateral pleural plaques, etiology unknown.  7. Cluster headaches.  8. COPD.  The patient status post evaluation by Dr. Delford Field at [facility] in August 2008.  At that time, PFTs were recommended, but      the patient never followed up.  9. Major depression, status post voluntary admission at [facility] in May 2004.  10.Erectile dysfunction.  11.History of polysubstance abuse.  12.Gastritis.  13.Degenerative disk disease at C3, C7, L3, L4, and L5-S1      spondylolisthesis.  14.Bilateral hip avascular necrosis.  15.Hypertension.  16.Left hydrocele.   MEDICATIONS:  1. Prevacid 30 mg 1 p.o. daily.  2. Aspirin 81 mg p.o. daily.  3. Percocet 10/650 mg 1 p.o. q.6 h.  4. MS Contin 15 mg 1 p.o. b.i.d.  5. Warfarin 5 mg 1 p.o. daily.  6. Amiodarone 200 mg 1 tablet p.o. daily.  7. Cardizem LA 450 mg 1 p.o. daily.  8. Promethazine 12.5 mg 1 p.o. t.i.d. p.r.n.  9. Atrovent 2 puffs b.i.d.  10.Ventolin 2 puffs q.i.d. p.r.n.  11.NicoDerm CQ 14 mg for 24 hours transdermal system.  12.Vancomycin 1 g IV q.12 h.  13.Meropenem 1 g IV q.12 h.   At the time of admission, the patient's vital signs were temperature of  99.2, pulse of 108, blood pressure 146/87, respiratory rate of 20,  saturation of 99% on room air.   PHYSICAL EXAMINATION:  GENERAL:  The patient is alert, well developed,  irritable.  HEAD:  Without any visible abnormality.  EYES:  Pupils, EOMs are intact bilaterally.  PERRLA.  No injection or  icterus appreciated bilaterally.  MOUTH:  Oropharynx is pink and moist.  No erythema.  No exudate.  NECK:  Supple.  Full range of motion.  No thyromegaly.  No JVDs.  No  carotid bruits.  LUNGS:  Clear to auscultation bilaterally.  HEART:  The patient was slightly tachycardic with a regular rhythm.  No  murmurs, gallops, or rubs.  ABDOMEN:  Significantly distended with a  shiny skin, diffusely tender to  palpation, soft, with bowel sounds positive.  MUSCULOSKELETAL:  No joint swelling.  No joint warmth.  No redness over  joints.  EXTREMITIES:  No clubbing or cyanosis.  There was a trace edema in lower  extremities bilaterally.  Pedal pulses of 2+/4 bilaterally.  NEUROLOGIC:  The patient is alert and oriented x3, and grossly intact.  Strength was normal in all extremities where sensation intact, but  diminished in the right leg relative to the left leg, with DTRs 2+/4  bilaterally, with negative Babinski bilaterally.  PSYCHIATRIC:  Memory intact for recent and remote.  The patient was in  irritable mood.  RECTAL:  No palpable masses.  No lesions.  It was positive for stool  impaction.  Prostate was normal.   LABORATORY DATA:  Sodium of 138; potassium of 4.7; chloride 107; bicarb  25; BUN of 17; creatinine of 1.26, at the time of last admission on  December 18, 2008, creatinine was 1.05; glucose of 81; calcium of 8.6.  WBCs of 11.6, at the time of previous discharge on December 18, 2008, WBCs  of 13.9;  hemoglobin of 12.8; hematocrit 38.4; MCV 88.3; platelet count  of 189.  INR of  2.6.  CK-MB less than 1.  Troponin less than 0.05.  myoglobin of 85.6.  Urinalysis, negative.   HOSPITAL COURSE BY PROBLEM:  1. Small bowel obstruction versus ileus -- Given the fact that the      patient is on the chronic pain medications including significant      amounts of MS Contin and Percocet, for his low back pain.  It is      most likely that his symptoms are opioid induced.  The patient      complained for the lack of bowel movement for 13 days prior to      admission.  He did have some small flatus, and he did not complain      of any nausea or vomiting or abdominal pain.  He has been      tolerating a regular diet without any nausea or vomitting.  He      states that he has had a colonoscopy a few years ago, which was      normal.  CT scan of the abdomen has been ordered  that demonstrated      a small bowel obstruction with the fecal impaction.  A Surgery      consult was requested at that time, and it was decided to manage      him medically.  The patient refused the NG tube placement at that      time; however, he did not exhibit any nausea or vomiting.  The      patient also has been placed on the soap suds enemas and the      patient has been having large bowel movements on a daily basis.      His abdominal distention subsided and the patient tolerated p.o.      diet without any difficulty.  2. AFib/atrial flutter.  Due to the patient's initial n.p.o. status,      his Cardizem and amiodarone p.o. has been discontinued, and the      patient has been changed to amiodarone and Cardizem IV drips.      However, the patient failed both therapies.  At that time, Dr.      Johney Frame with Cardiology has been consulted and a trial of digoxin      therapy has been attempted.  However, the patient failed digoxin      therapy as well.  The patient's heart rate remained in the range      between 110 and 123 at that time of discharge.  Per Dr. Johney Frame, the      patient was safe to be discharged and followed on an outpatient      basis.  3. Chronic low back pain.  We discussed with the patient extensively      pain management and physical therapy for his symptoms.  The patient      continued to refuse physical therapy or an evaluation by an      Orthopedic specialist.  We have decreased his Percocet intake      frequency.  We discontinued him off MS Contin.  The patient's pain      has been controlled with this regimen.The patient is cautioned on      having his opioid medications for pain control.  4. COPD.  The patient has been stable throughout his hospital stay.  5. 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  The patient has  been      monitored by the Pharmaceutical Department, and has been managed       first with the heparin and later with Coumadin therapy.  6. Hypertension.  The patient has been restarted on his Cardizem LA      480 mg p.o. daily and the patient remained normotensive.   DISCHARGE LABORATORIES:  Sodium of 138, potassium of 4.0, chloride 106,  bicarb 24, glucose 105, BUN 8, creatinine 1.1.  CBC:  WBCs of 6.5,  hemoglobin of 11.2, hematocrit 33.0, MCV of 87.3, platelet counts of  217.  INR of 2.8.  Drug urine screen was negative.   Vital signs at the time of discharge was temperature of 99.2, pulse rate  of 123, respiratory rate of 18, systolic blood pressure of 127,  diastolic blood pressure of 100, saturating 98% on room air.      Deatra Robinson, MD  Electronically Signed      C. Ulyess Mort, M.D.  Electronically Signed    NK/MEDQ  D:  12/31/2008  T:  01/01/2009  Job:  [id number]   cc:   Juanetta Gosling, MD  Bevelyn Buckles. Bensimhon, MD  Hillis Range, MD

## 2010-10-02 NOTE — Consult Note (Signed)
NAME:  [name], [name] NO.:  [id number]   MEDICAL RECORD NO.:  [id number]         PATIENT TYPE:  LINP   LOCATION:                               FACILITY:  [facility]   PHYSICIAN:  Jordan Hawks. Elnoria Howard, MD    DATE OF BIRTH:  17-May-1946   DATE OF CONSULTATION:  03/15/2008  DATE OF DISCHARGE:                                 CONSULTATION   REASON FOR CONSULTATION:  Ileus.   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  The patient presented  on March 08, 2008, and underwent a replacement of his right hip  secondary to his avascular necrosis.  The patient underwent the  procedure without any difficulty and during the postoperative phase  during the first week, the patient denies having any bowel movements.  Subsequently, he was operated on October 20 and he reports having his  first bowel movement on October 24, and at that time, he was started on  laxatives.  Because of the laxative use, he reported having diarrhea  which was very difficult for him as he is not able to move properly.  Subsequently, the patient was then decreased on some of his laxatives,  and he feels that the diarrhea has improved.  Interestingly, the patient  does report having history of diarrhea even before his surgery, but he  is unable to provide any clear history regards to this issue.  Despite  decreasing his laxatives, the patient reports having some diarrhea and a  KUB was performed for complaints of abdominal pain which was new.  The  KUB revealed that he had an ileus, but no evidence of any obstruction.  There is a significant amount of stool in the transverse colon.  Subsequently, GI consultation was  requested for further evaluation and  treatment.   PAST MEDICAL HISTORY AND PAST SURGICAL HISTORY:  As stated above.   FAMILY HISTORY:  Noncontributory.   SOCIAL HISTORY:  Negative for alcohol, tobacco, illicit drug use.   ALLERGIES:  PENICILLIN.   REVIEW OF SYSTEMS:  As stated above in history present illness,  otherwise, negative.   MEDICATIONS:  1. Bisacodyl 10 mg PR daily.  2. Coreg 12.5 mg p.o. b.i.d.  3. Lanoxin 0.25 mg p.o. daily.  4. Cardizem 180 mg p.o. b.i.d.  5. Colace 100 mg p.o. b.i.d.  6. Lovenox.  7. Iron sulfate 325 mg p.o. t.i.d.  8. Advair one puff inhaler b.i.d.  9. MS Contin 60 mg p.o. q.12 h.  10.Protonix 40 mg p.o. daily.  11.Coumadin 1 mg p.o. daily.  12.Methocarbamol 500 mg p.o. q.6 h.  13.Zofran 4 mg IV q.6 h.   PHYSICAL EXAMINATION:  VITAL SIGNS:  Blood pressure is 119/99, heart  rate is 88, respirations 20, temperature is 98.2.  GENERAL:  The patient is in no acute distress, alert and oriented.  HEENT:  Normocephalic, atraumatic.  Extraocular muscles intact.  NECK:  Supple.  No lymphadenopathy.  LUNGS:  Clear to auscultation bilaterally.  CARDIOVASCULAR:  Regular rate and rhythm.  ABDOMEN:  Flat, soft, nontender, nondistended.  Soft.  Midabdomen:  No  rebound, rigidity.  Positive bowel sounds.  EXTREMITIES:  No clubbing, cyanosis or edema.   LABORATORY VALUES:  White blood cell count on March 11, 2008 is 12.8,  hemoglobin 11.0, PT is 26.3, INR 2.3.  Sodium is 140, potassium 3.9,  chloride is 109, CO2 25, glucose 123, BUN 13, creatinine 1.1.   IMPRESSION:  1. Ileus secondary to immobility and pain medications.  2. History of diarrhea.   It is apparent that the patient's ileus is secondary to the pain  medications and his immobility, though he is trying to wean down on pain  medications at this time.  The KUB does reveal an ileus, and there is a  significant amount of stool in the transverse colon.  The patient has a  type of  diarrheal-type of history, but I am unable to get a clear  understanding of how this had previously affected the patient.  Irregardless, he may be having some overflow diarrheal issues at this  time, given the amount of stool that is in his transverse colon.  The  KUB does reveal a significant amount in the transverse colon.  He has  been decreased on his laxatives, and this may benefit.  Ultimately, the  treatment that will benefit the patient is the reduction of his pain  medications and mobility.   PLAN:  1. Continue to wean on narcotic medications as much as possible.  2. Ambulate when possible.  3. Repeat his electrolytes to ensure that there is no abnormalities      that need to be corrected.  4. Daily KUB.  5. No NG tube at this time.      Jordan Hawks Elnoria Howard, MD  Electronically Signed     PDH/MEDQ  D:  03/15/2008  T:  03/15/2008  Job:  [id number]   cc:   Deidre Ala, M.D.  Fax: [phone]

## 2010-10-02 NOTE — Consult Note (Signed)
[name], NOU NO.:  [id number]   MEDICAL RECORD NO.:  [id number]          PATIENT TYPE:  INP   LOCATION:  2928                         FACILITY:  [facility]   PHYSICIAN:  Hillis Range, MD       DATE OF BIRTH:  [birth year]   DATE OF CONSULTATION:  DATE OF DISCHARGE:  06/21/2008                                 CONSULTATION   REQUESTING PHYSICIAN:  Danne Harbor, MD   REASON FOR CONSULTATION:  Rapid ventricular rate.   HISTORY OF PRESENT ILLNESS:  [name] is a 65 year old gentleman with  a history of persistent atrial fibrillation, atrial flutter with rapid  ventricular rates, multiple venothromboembolism, COPD, and medical  noncompliance.  He was admitted on July 26, 2008 with abdominal pain and  diarrhea.  The patient was recently hospitalized in January of 2010 with  atrial flutter with rapid ventricular rates.  At that time, his  pacemaker was found to be at elective replacement interval and therefore  replaced with a St. Jude Medical Zephrex L SR single-chamber pacemaker  on June 17, 2008.  An attempt was made to ablate his atrial flutter.  However, this was unsuccessful as the patient was found to have  occlusions of the right and left common femoral veins.  This was  subsequently confirmed by Doppler in my office on July 07, 2008.  At  that same time, he presented with right upper extremity pain and  swelling and was also documented to have a new right upper extremity  venous occlusion.  He was restarted on Coumadin and Lovenox at that  time.  He has had difficulties for many months with atrial fibrillation  and atrial flutter with rapid ventricular rates.  His heart rates have  been difficult to control both in the hospital and at home.  Part of his  difficulties with rate control at home had likely been due to  noncompliance with medical therapy.  He left the hospital during his  recent hospitalization against medical advice.  Prior  to discharge, I  took the liberty of starting the patient on amiodarone with a plan to  bring him back in 4 weeks for cardioversion.  Unfortunately, he has been  intermittently compliant with both Coumadin and his amiodarone.  He now  presents with symptoms of cramping abdominal pain, nausea, vomiting, and  diarrhea.  He has had significant decrease in oral intake.  He has also  had mild worsening of shortness of breath.  He denies orthopnea, PND,  lower extremity edema, presyncope, syncope, chest pain, fevers, chills,  or other concerns.   PAST MEDICAL HISTORY:  1. Persistent atrial fibrillation.  2. Typical-appearing atrial flutter with rapid ventricular rates.  3. Status post pacemaker implantation for tachycardia-bradycardia      syndrome.  4. 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  A prior CT scan has also      documented mesenteric venous occlusion.  5. Diabetes mellitus.  6. Chronic obstructive pulmonary disease.  7. Gastroesophageal reflux disease.  8. Preserved ejection  fraction.  9. Avascular necrosis of the hip, status post surgery in 2009.  10.Prior Clostridium difficile colitis.  11.Marijuana use.  12.Medical noncompliance.   ALLERGIES:  PENICILLIN.   CURRENT MEDICATIONS:  1. Amiodarone drip.  2. Zofran p.r.n.  3. Dilaudid p.r.n.  4. Metoprolol 100 mg t.i.d. recently discontinued.  5. Protonix 40 mg daily.  6. Albuterol p.r.n.  7. Digoxin 0.125 mg daily.  8. Advair inhaled b.i.d.  9. Morphine sulfate 60 mg every 12 hours.  10.Coumadin 7.5 mg at bedtime  11.IV ciprofloxacin 400 mg daily.  12.Lovenox 90 mg subcu b.i.d.  13.Diltiazem drip.   SOCIAL HISTORY:  The patient lives in [place].  He smokes one pack  per day and has done so for a long time.  He has a history of recent  marijuana use and denies alcohol consumption.   FAMILY HISTORY:  Hypertension and diabetes.   REVIEW OF SYSTEMS:   All systems are reviewed today and negative except  as outlined in the HPI above.   PHYSICAL EXAMINATION:  TELEMETRY:  Atrial flutter with two-to-one  conduction and an average ventricular rate of 130 beats per minute.  VITAL SIGNS:  Blood pressure 95/67, heart rate 130, respirations 18,  sats 98% on room air, afebrile.  GENERAL:  The patient is a thin gentleman in no acute distress.  He is  alert and oriented x3.  HEENT:  Normocephalic, atraumatic.  Sclerae clear.  Conjunctivae pink.  Oropharynx clear.  NECK:  Supple.  No JVD, thyromegaly, or bruits.  LUNGS:  Clear to auscultation bilaterally.  HEART:  Irregularly irregular, tachycardic rhythm, 2/6 systolic ejection  murmur along the left sternal border.  GI:  Soft, nontender, nondistended.  Positive bowel sounds.  No rebound  or guarding.  EXTREMITIES:  No clubbing, cyanosis, or edema.  The patient has mild  swelling of the right upper extremity, which is significantly improved  from my exam on July 07, 2008.  He does have erythema and warmth  over the site of his IV.  SKIN:  As above.  The patient's pacemaker site appears be healing  nicely.  There is no evidence of infection.  MUSCULOSKELETAL:  No deformity or atrophy.  PSYCH:  Euthymic mood.  Full affect.   EKG from today reveals atrial flutter with two-to-one conduction with an  average ventricular rate of 140 beats per minute with nonspecific ST/T-  wave changes and left axis deviation.   Transesophageal echocardiogram, June 17, 2008, reveals a moderately  depressed ejection fraction with moderate aortic insufficiency and  trivial mitral regurgitation.  Cardiac markers, negative.  INR 1.7.  Thyroid profile normal.  Digoxin less than 0.2.   IMPRESSION:  [name] is a 65 year old gentleman with longstanding  atrial arrhythmias including both atrial fibrillation and atrial  flutter.  Due to his chronic common femoral venous occlusions, he is not  a candidate for  catheter ablation of his atrial fibrillation or his  atrial flutter and is therefore left to medical therapies.  These have  been very difficult in the past primarily due to patient's  noncompliance, though in fact, his heart rates are difficult to control  despite IV medications during hospitalization.  He is currently being  treated with an IV amiodarone drip.  I think that the most reasonable  strategy at this time would be to continue the patient on his amiodarone  drip for a 36-hour load.  As the patient has had months of rapid  ventricular rates, I do not think  that we need to be overly aggressive  with rate control presently, though I do think it would be reasonable to  restart the patient on metoprolol 100 mg twice daily as his blood  pressure allows.  After amiodarone load has been completed, I think that  a transesophageal echocardiogram and cardioversion may be beneficial.  I  have discussed the risks and benefits of both transesophageal  echocardiogram and cardioversion with the patient who is willing to  proceed.  As he has recently had a right upper extremity documented, I  think that we should remove the IV from his right and I would favor  administration of amiodarone over PICC line.   Thank you for the opportunity of participating in the care of Mr.  Pemble.  Please feel free to contact me if I can be of further  assistance.      Hillis Range, MD  Electronically Signed     JA/MEDQ  D:  07/27/2008  T:  07/28/2008  Job:  [id number]
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
## 2010-10-02 NOTE — Consult Note (Signed)
NAME:  [name], [name]                ACCOUNT NO.:  [id number]   MEDICAL RECORD NO.:  [id number]          PATIENT TYPE:  INP   LOCATION:                               FACILITY:  [facility]   PHYSICIAN:  Noralyn Pick. Eden Emms, MD, FACCDATE OF BIRTH:  [birth year]   DATE OF CONSULTATION:  DATE OF DISCHARGE:  07/31/2008                                 CONSULTATION   A 65 year old patient with atrial fibrillation, previous pacemaker.  The  patient was admitted with C. diff colitis and rapid atrial fibrillation  and TEE cardioversion was done to restore sinus rhythm.   The patient was sedated with a 100 mcg of fentanyl and 7 mg of Versed.  He received 40 mg of propofol for cardioversion.   Using digital technique, an Omniplane probe was advanced into the distal  esophagus without incident.  The patient had normal LV function with  mild LVH.  EF was 55%.  There was mild MR with mild biatrial  enlargement.  Pacemaker leads and a PICC line were seen in the right  atrium.  Aortic valve was trileaflet with mild aortic insufficiency.  Aortic root was mildly dilated.  Imaging of the left atrial appendage  showed it to be mildly dilated with no spontaneous contrast and no  thrombus.  Imaging of the aorta showed no debris.  There was no ASD or  VSD.   The patient was shocked with a single 200-joule biphasic shock.  He  converted to normal sinus rhythm.   IMPRESSION:  Successful transesophageal echocardiography-guided  cardioversion.  INR at the time of cardioversion was 2.1, and the  patient also received an extra 5 mg of Coumadin 2 hours prior to the  procedure and a shot of Lovenox 1 mg/kg 2 hours before the procedure.   He will continue to follow up with Dr. Johney Frame in regards to his sick  sinus syndrome, pacemaker therapy, and paroxysmal atrial fibrillation.   He tolerated the procedure well.      Noralyn Pick. Eden Emms, MD, [facility]  Electronically Signed     PCN/MEDQ  D:  07/29/2008  T:  07/29/2008  Job:   [id number]

## 2010-10-02 NOTE — Op Note (Signed)
NAME:  [name], [name] NO.:  [id number]   MEDICAL RECORD NO.:  [id number]          PATIENT TYPE:  INP   LOCATION:  2014                         FACILITY:  [facility]   PHYSICIAN:  Hillis Range, MD       DATE OF BIRTH:  October 26, 1945   DATE OF PROCEDURE:  06/17/2008  DATE OF DISCHARGE:                               OPERATIVE REPORT   SURGEON:  Hillis Range, MD   PREPROCEDURE DIAGNOSES:  1. Pacemaker at elective replacement indicator.  2. Right ventricular lead with elevated pacing threshold.  3. Persistent atrial fibrillation and atrial flutter with rapid      ventricular rates.   POSTPROCEDURE DIAGNOSES:  1. Pacemaker at elective replacement indicator.  2. Right ventricular lead with elevated pacing threshold.  3. Persistent atrial fibrillation and atrial flutter with rapid      ventricular rates.   PROCEDURES:  1. Right upper extremity venogram.  2. Pacemaker removal.  3. Successful implantation of a single-chamber pacemaker.  4. Cardioversion.   INTRODUCTION:  [name] is a 65 year old gentleman with a history of  persistent atrial fibrillation and atypical atrial flutter status post  prior single-chamber pacemaker implantation by Dr. Aleen Campi in 2004 for  tachycardia-bradycardia syndrome.  He has done very well with his single-  chamber pacemaker since that time.  He is now admitted for pulse  generator replacement.  He was noted to have an elevated threshold from  his right ventricular pacing lead.  He also presents, therefore, for a  new right ventricular lead placement.  He has had atrial flutter and  atrial fibrillation with rapid ventricular rates.  Transthoracic  echocardiogram today reveals no left atrial thrombus.  We also,  therefore, planned to cardiovert the patient.   DESCRIPTION OF THE PROCEDURE:  Informed written consent was obtained and  the patient was brought to the Electrophysiology Lab in a fasting state.  He was adequately sedated with  intravenous Versed and fentanyl as  outlined in the nursing report.  The patient's right and left chest was  prepped and draped in the usual sterile fashion by the EP lab staff.  A  venogram of the right upper extremity revealed a patent left axillary  and left subclavian veins.  There was a mild stricture at the junction  of the right subclavian and superior vena cava.  The skin overlying the  right existing pacemaker pocket was infiltrated with lidocaine for local  analgesia.  A 5-cm incision was then made over the pacemaker pocket.  Electrocautery was used to assure hemostasis.  The existing pacemaker  was freed and removed from the pocket.  The pocket was then revised  carefully and hemostasis was assured.  The existing right ventricular  lead was confirmed to be a [employer]er[employer] model 1336T lead.  The R-  wave measured 7.6 millivolts with an impedance of 536 ohms and a  threshold of 205 volts at 0.5 milliseconds.  The lead was capped and  returned to the pocket.  Using a percutaneous Seldinger technique with  fluoroscopic visualization, the right axillary vein was accessed.  Through this vein, a St. Jude Medical Tendril ST model [id number] (serial  [id number]).  A right ventricular lead was advanced into the right  ventricular apex position and actively secured to the myocardium.  The  initial lead measurements revealed an R-wave of 7.1 mV with an impedance  of 1025 ohms and a threshold of 0.6 volts at 0.5 milliseconds.  A large  injury current was observed.  The lead was then actively secured to the  pectoralis fascia using 2 silk suture over the suture sleeves.  The lead  was then connected to a St. Jude Medical Zephyr XL SR model [id number] (serial  [id number]) single chamber pacemaker.  The pacemaker was returned to the  existing pacemaker pocket after it had been revised.  Copious gentamicin  solution was then used to irrigate the pocket.  Care was again taken to  assure hemostasis.  The  pocket was then closed in 2 layers with 2.0  Vicryl suture for the subcutaneous and subcuticular layers.  Steri-  Strips and a sterile bandage were then applied.  The patient was noted  to be in atrial flutter with rapid ventricular rates.  He was,  therefore, successfully cardioverted to sinus rhythm with a single  synchronized 200-joule biphasic shock with cardioversion electrodes in  the anterior-posterior thoracic configuration.  He remained in sinus  rhythm thereafter.  There were no early apparent complications.   CONCLUSIONS:  1. Successful implantation of a St. Jude Medical Zephyr XL SR single-      chamber pacemaker as described above.  2. The existing St. Jude model 1336T-58 pacing lead was noted to have      an elevated pacing threshold and was, therefore, replaced with a      St. Jude Medical Tendril ST model [id number] lead.  3. Successful cardioversion to sinus rhythm.  4. No early apparent complications.      Hillis Range, MD  Electronically Signed     JA/MEDQ  D:  06/17/2008  T:  06/18/2008  Job:  [id number]   cc:   [name], M.D.

## 2010-10-02 NOTE — Discharge Summary (Signed)
[name], Travis NO.:  [id number]   MEDICAL RECORD NO.:  [id number]          PATIENT TYPE:  INP   LOCATION:  2607                         FACILITY:  [facility]   PHYSICIAN:  [name], M.D.    DATE OF BIRTH:  1945-07-03   DATE OF ADMISSION:  05/31/2008  DATE OF DISCHARGE:  06/03/2008                               DISCHARGE SUMMARY   DISCHARGE DIAGNOSES:  1. Clostridium difficile colitis relapse.  2. Chronic atrial fibrillation on Coumadin.  3. 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  4. Chronic obstructive pulmonary disease.  5. 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  6. Congestive heart failure.  7. Marijuana abuse.   DISCHARGE MEDICATIONS:  1. Vancomycin 125 mg 1 tablet 4 times a day for 7 more days.  2. Cardizem 180 mg twice daily.  3. Lopressor 100 mg 3 times daily.  4. Digoxin 0.125 mg 1 tablet daily.  5. Lasix 40 mg 1 tablet on Monday, Wednesday, and Friday.  6. Phenergan 25 mg 1 tablet every 6 hours as needed for nausea.  7. Percocet 10/650 mg 1 tablet every 6-8 hours as needed for pain.  8. MS Contin 60 mg 1 tablet 2 times a day as needed for pain.  9. ProAir HFA 90 mcg or spray 1-2 puffs every 4-6 hours as needed for      shortness of breath.  10.Advair Diskus 250/50 one puff 2 times a day.  11.Aspirin 81 mg daily.  12.Prevacid 30 mg 1 tablet daily.  13.Warfarin 7.5 mg for 2 days to be reviewed by Dr. Algie Coffer on Monday,      January 18.  14.Viagra 50 mg 1 tablet 30 minutes before sex.  15.Lovenox injections once daily as advised.  16.Home health RN and social worker have been set up for the patient      to help with his medications.   DISPOSITION AND FOLLOWUP:  1. The patient will follow with Outpatient Clinic for his primary care      needs.  An appointment has been set up with Dr. Aleene Davidson on  June 22, 2008, at 2:00 p.m.  At the followup visit, the patient      will be reviewed for resolution of his symptoms of diarrhea.      Repeat BMET is advised at that time.  The primary care physician is      also requested to make sure the patient is adequately being      followed up by his cardiologist in terms of his heart problems,      mainly atrial fibrillation and Coumadin management.  2. The patient will see his cardiologist, Dr. Algie Coffer, phone [phone].  An appointment has been set up for Monday, June 06, 2008,      at 12:00 p.m.  At the followup visit, the patient will be reviewed      for his cardiac problems, and his Coumadin dose  will be adjusted in      terms of his INR.   STUDIES/PROCEDURES:  CT of abdomen and pelvis without contrast, May 31, 2008, impression; no evidence of acute abnormality within the  abdomen.  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  No evidence of acute abnormality within the  pelvis.   CONSULTS:  Telephone consultation was held with the patient's primary  cardiologist in terms of adjusting his medications for his chronic  atrial fibrillation and from Dr. Roseanne Kaufman advice, his Lopressor was  increased to 100 mg 3 times a day.   HISTORY OF PRESENT ILLNESS:  Mr. Eble is a 65 year old man with  multiple medical issues as mentioned above, who presented with chief  complaints of nausea, vomiting, and diarrhea on June 01, 2008.  The  patient mentioned of having had nausea, vomiting, and watery diarrhea  for several days.  The patient stated subjective fever with chills and  rigors.  The patient also complained of diffuse abdominal pain for  several days as well as increasing distention.  His vomiting was  characterized as clear emesis, mostly watery without any blood or coffee-  ground materials.  The patient did have a history of C. diff colitis on  most recent hospital admission in  November 2009.   PHYSICAL EXAMINATION:  VITAL SIGNS:  Blood pressure 144/98, pulse rate  111 and irregular, respiratory rate 18, temperature 98.9, and oxygen  saturation 98% on room air.  GENERAL:  NAD.  HEENT:  Normocephalic and atraumatic.  EOMI, PERRL, no icterus, no  pallor.  Mucous membranes are moist.  NECK:  Supple without lymphadenopathy.  CHEST:  Clear to auscultation bilaterally.  CARDIOVASCULAR:  Irregular rate, tachycardia.  No murmur, rubs, or  gallops.  ABDOMEN:  Soft but diffuse tenderness, distended without mass  organomegaly.  Normal bowel sounds.  No guarding or rebound tenderness  noted.  EXTREMITIES:  No cyanosis, clubbing, or edema.  NEURO:  Alert and oriented without any focal deficits.  SKIN:  Normal color.  No rash.  No petechia.  PSYCHIATRIC:  Alert and oriented.  Normally interactive.   ADMISSION LABORATORY DATA:  WBC 11.7, hemoglobin 18.1, and platelet 235.  Sodium 144, potassium 4.0, chloride 105, carbon dioxide 23, glucose 103,  BUN 15, creatinine 1.66, calcium 10.9, protein 8.6, albumin 4.6, AST 24,  ALT 20, alkaline phosphatase 119, and bilirubin 1.3.   HOSPITAL COURSE:  1. C. diff colitis.  After detailed workup on the cause of his      abdominal pain, nausea, vomiting and diarrhea, we think the      patient's symptoms are most likely secondary to recurrence of his      C. diff colitis.  The patient was basically treated symptomatically      in the beginning with IV fluids and antiemetics and pain medicines,      and was also started on p.o. vancomycin which he is to continue      taking to complete a 10-day course.  The patient's symptoms      gradually subsided upon initiation of the above treatment.  2. Chronic atrial fibrillation.  The patient's regular medicines were      continued as well as some adjustments were made, mainly his      Lopressor was increased to 100, 3 times a day.  He was placed on      Cardizem and digoxin.  We will also  touch base with  his      cardiologist, Dr. Algie Coffer as mentioned above.  The patient's      Coumadin was continued and he is sent home with Lovenox bridge.  3. COPD.  The patient's regular inhaler medications were continued.  4. History of right hip avascular necrosis/chronic pain syndrome.  The      patient was placed on p.r.n. pain medicines and upon discharge, his      regular medicines were continued.   DISCHARGE DAY LABORATORIES:  WBC 8.3, hemoglobin 13.7, and platelet 175.  INR 1.6.  Sodium 142, potassium 3.6, chloride 115, bicarbonate 18,  glucose 95, BUN 13, creatinine 1.14, and calcium 9.1.   DISCHARGE DAY VITALS:  Temperature 98.6, heart rate ranging between 99  and 105, respirations 20, blood pressure 110/80, and oxygen saturation  97% on room air.       Travis Council, MD  Electronically Signed      [name], M.D.  Electronically Signed    AS/MEDQ  D:  06/05/2008  T:  06/06/2008  Job:  [id number]

## 2010-10-02 NOTE — Consult Note (Signed)
NAMEISLAM, [name] NO.:  [id number]   MEDICAL RECORD NO.:  [id number]          PATIENT TYPE:  INP   LOCATION:  2607                         FACILITY:  [facility]   PHYSICIAN:  [name], MDDATE OF BIRTH:  [birth year]   DATE OF CONSULTATION:  08/16/2008  DATE OF DISCHARGE:                                 CONSULTATION   PRIMARY CARDIOLOGIST:  Dr. Johney [name].   REASON FOR CONSULT:  Recurrent atrial fibrillation/flutter with RVR.   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  He is status post pacemaker  placement.  He is admitted in January of this year with atrial flutter  and rapid ventricular response.  His pacemaker was found to be near end  of life and was replaced.  He also underwent attempted atrial flutter  ablation.  However, this was unsuccessful as he had occluded right and  left, femoral veins and adequate access could not be obtained.  These  occlusions were confirmed by ultrasound.   He was readmitted on July 27, 2008 with acute gastroenteritis and acute  renal insufficiency and at that time, he had atrial flutter a heart rate  of 140.  He was started on IV amiodarone and on July 29, 2008 underwent  successful electrical cardioversion of atrial flutter by Dr. Eden [name] with  successful restoration of sinus rhythm.   Over the past 3 days he has had a productive cough with chills and low  grade fevers and dyspnea.  He was admitted with possible pneumonia.  Chest x-ray showed chronic scarring of the left lower lobe and lingula  with no clear infiltrate.  Unfortunately, on admission he is once again  in apparent atrial flutter versus fibrillation with RVR with heart rates  from 100-110.  He is started on IV Cardizem and his heart rates are now  down the 70s or 80s.  His INR is 5.2.   REVIEW OF SYSTEMS:  He has had  some anorexia and poor p.o. intake.  He  denies any chest pain.  No palpitations.  No heart failure symptoms.  No  focal neurologic symptoms.  No bleeding.  The remainder review of  systems is negative except for HPI and problem list.   PROBLEM LIST:  1. 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      Status post electrical cardioversion on July 29, 2008 in the      setting of recent amiodarone load.  2. Chronic obstructive pulmonary disease with ongoing tobacco use.  3. 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  4. Diabetes.  5. Hypertension.  6. Gastroesophageal reflux disease.  7. Avascular necrosis of both hips.  8. Normal ejection fraction on echo.   CURRENT MEDICATIONS:  1. Diltiazem drip at 7.5 mg per hour.  2. Amiodarone 200 a day.  3. Aspirin 81 a day.  4. Coumadin.  5. Zithromax.  6. [name].  7.  Digoxin 0.125 a day.  8. Lopressor 100 b.i.d. which is decreased from a previous of 100      t.i.d.  9. Vancomycin.  10.Albuterol p.r.n.  11.Zofran p.r.n.   ALLERGIES:  PENICILLIN which causes a rash.   SOCIAL HISTORY:  He lives alone.  He is separated for a long time.  He  continues to smoke.  Denies alcohol or drugs.   FAMILY HISTORY:  Noncontributory.   PHYSICAL EXAM:  CONSTITUTIONAL:  He is in no acute distress.  He is  sitting up eating.  VITAL SIGNS:  Blood pressure is 91/77, heart rate 77, satting 98% on  room air.  HEENT:  Normal except for poor dentition.  NECK:  Supple.  No obvious JVD.  Carotids are 2+ bilaterally without  bruits.  There is no lymphadenopathy or thyromegaly.  CARDIAC:  PMI is nonpalpable.  He is irregularly irregular with distant  heart sounds.  No obvious murmurs.  LUNGS:  Clear with decreased air movement throughout.  No wheezing.  ABDOMEN:  Soft, nontender, nondistended.  No hepatosplenomegaly, no  bruits.  No masses.  Good bowel sounds.   EXTREMITIES:  Warm with no cyanosis, clubbing or edema and no rash.  NEURO:  He is alert and oriented x3.  Cranial nerves II-XII are intact.  Moves all four extremities without difficulty.   EKG has a poor baseline looks like to atrial flutter with rapid  ventricular response about 101 beats per minute.  White count is 12.4,  hemoglobin is 11.7, platelets 144,000.  Sodium 136, potassium 4.0, BUN  is 55 with a creatinine of 2.82.  This is down from 3.61, baseline looks  like about 1.2-1.3.  INR is 5.2.  Thyroid panel is normal.  Digoxin is  1.2, troponin is less than 0.01.  UA urinalysis shows moderate  leukocytosis.   ASSESSMENT:  1. Recurrent atrial fibrillation and flutter with rapid ventricular      response now rate controlled on IV Diltiazem.  Previous recent      direct current cardioversion on July 29, 2008 after amiodarone      loading.  2. Possible pneumonia versus bronchitis.  3. Urinary tract infection.  4. Chronic obstructive pulmonary disease.  5. Acute renal failure which is improving.   PLAN/DISCUSSION:  This is a difficult case.  At this point I would  discontinue the Cardizem drip and start him on IV amiodarone both for  rate control and possible cardioversion.  Would continue his other  medications, blood pressure permitting.  Dr. Johney [name] will see him in the  morning for further disposition.  We appreciate the consultation.  Please do not hesitate to call with questions.      Travis Buckles. Bensimhon, MD  Electronically Signed     DRB/MEDQ  D:  08/16/2008  T:  08/16/2008  Job:  [id number]

## 2010-10-02 NOTE — H&P (Signed)
NAME:  [name], [name] NO.:  [id number]   MEDICAL RECORD NO.:  [id number]          PATIENT TYPE:  OBV   LOCATION:  2007                         FACILITY:  [facility]   PHYSICIAN:  [name], M.D.  DATE OF BIRTH:  01-Dec-1945   DATE OF ADMISSION:  06/18/2007  DATE OF DISCHARGE:                              HISTORY & PHYSICAL   CHIEF COMPLAINT:  Palpitation.   HISTORY OF PRESENT ILLNESS:  This 65 year old black male had a 1-day  history of palpitations.  The patient apparently ran out of his beta  blocker medication.  He denies any chest pain or shortness of breath.  He has a history of cardiomyopathy, no history of fever.  He has a  chronic cough and has chronic nausea and vomiting.  The patient admits  to taking mild cardiac stimulant like caffeine intake.   PAST MEDICAL HISTORY:  1. Negative for diabetes type 2.  2. Positive for smoking.  3. Positive for chronic obstructive lung disease.  4. Positive for atrial flutter-fibrillation.  5. Positive for gastroesophageal reflux disease.  6. Positive for depression.   PAST SURGICAL HISTORY:  1. Pacemaker placement.  2. Superior mesenteric vein thrombosis treatment.  3. Right pulmonary nodule surgery.   MEDICATIONS:  1. Metoprolol 100 mg one twice daily.  2. Lasix 40 mg one on Monday/Wednesday/Friday.  3. Kay Ciel 10 mEq one on Monday/Wednesday/Friday.  4. Diltiazem 90 mg one twice daily.  5. Megace 200 mg twice daily.  6. Thiamine 100 mg one daily.  7. Folic acid 1 mg daily.  8. Lanoxin 0.125 mg one daily.  9. Albuterol metered-dose inhaler 2 puffs four times daily as needed.  10.Ambien 5 mg one at bedtime.  11.Coumadin 5 mg one daily.  12.Advair 250/50 one puff twice daily.  13.MS Contin 60 mg twice daily.  14.Dulcolax 15 mg as needed for constipation.   ALLERGIES:  Include PENICILLIN causing rash.   FAMILY HISTORY:  Mom died of asthma and myocardial infarction in her  34s.  Father died in his 55s;  he had a history of alcohol use.   SOCIAL HISTORY:  The patient is separated for 35 years and lives alone.   REVIEW OF SYSTEMS:  The patient denies recent weight gain or weight  loss.  He wears reading glasses.  He has a positive history of chronic  obstructive lung disease.  He has history of chest pain.  No history of  GI bleed.  Positive history of arthritis.  No history of stroke,  seizures or psychiatric admissions.   PHYSICAL EXAMINATION:  VITAL SIGNS:  Temperature 97, pulse 140,  respiration 18, blood pressure 110/80.  GENERAL:  The patient is a well-built, averagely nourished black male in  no significant distress.  HEENT: The patient is normocephalic, atraumatic.  He has brown eyes and  wears glasses.  Ears, nose and throat grossly unremarkable.  NECK:  No JVD, no carotid bruit.  LUNGS:  Clear bilaterally.  HEART:  Normal S1-S2 with grade 2/6 systolic murmur.  ABDOMEN:  Soft and nontender.  EXTREMITIES:  No edema, cyanosis or  clubbing.  Skin:  Warm and dry.  NEUROLOGICAL:  Cranial nerves grossly intact.  The patient moves all 4  extremities   LABORATORY DATA:  Pending.   EKG:  Atrial flutter with rapid ventricular response.   IMPRESSION:  1. Atrial flutter with rapid ventricular response.  2. Chronic obstructive lung disease.  3. Dilated cardiomyopathy.  4. Protein-calorie malnutrition.   RECOMMENDATIONS:  This patient will be admitted to a telemetry bed.  I  have started IV Cardizem drip.  He will get IV Lopressor as needed.  His  home medications were continued and he will have routine labs.      [name], M.D.  Electronically Signed     ASK/MEDQ  D:  06/18/2007  T:  06/19/2007  Job:  [id number]

## 2010-10-02 NOTE — Consult Note (Signed)
NAME:  [name], [name] NO.:  [id number]   MEDICAL RECORD NO.:  [id number]          PATIENT TYPE:  INP   LOCATION:  2011                         FACILITY:  [facility]   PHYSICIAN:  Jordan Hawks. Elnoria Howard, MD    DATE OF BIRTH:  [birth year]   DATE OF CONSULTATION:  03/22/2008  DATE OF DISCHARGE:                                 CONSULTATION   REASON FOR CONSULTATION:  Abdominal pain and diarrhea.   HISTORY OF PRESENT ILLNESS:  This is a 65 year old gentleman with past  medical history of COPD, atrial flutter/atrial fibrillation,  gastroesophageal reflux disease, depression, status post pacemaker  placement, superior mesenteric vein thrombosis, right pulmonary nodule  and status post right hip replacement who is readmitted for atrial  fibrillation with RVR.  The patient was complaining of feeling weak and  having some palpitations.  His heart rate is noted to be elevated in the  160s, and subsequently he was transferred back to the hospital for  further evaluation and treatment.  Since his readmission, the patient  did complains of having diarrhea and having abdominal pain.  Previously,  he was being evaluated for adynamic ileus which appeared to have  resolved.  He did have some moderate issues with diarrhea; however, it  is just felt to be secondary to the medications that he was provided for  constipation.  Unfortunately at this time, he did have diarrhea and  there was also complaints of abdominal pain.  He did have some mild  abdominal pain previously with ileus, but again has felt to be secondary  to her postoperative ileus.  At this time, he does have bilateral lower  abdominal pain with no rebound or rigidity.  He has noted several watery  bowel movements, but that appears to have abated for the time being and  his pain is also subsided.  A fecal occult blood was obtained and he was  noted to be heme positive.   PAST MEDICAL HISTORY AND PAST SURGICAL HISTORY:  As stated  above.   FAMILY HISTORY:  Noncontributory.   SOCIAL HISTORY:  Negative for alcohol, tobacco, or illicit drug use.   ALLERGIES:  PENICILLIN.   REVIEW OF SYSTEMS:  As stated above in the history of present illness,  otherwise negative.   MEDICATIONS:  1. Tylenol 650 mg one p.o. q.12 h. p.r.n.  2. Aspirin 325 mg p.o. daily.  3. Diltiazem 300 mg p.o. daily.  4. Metoprolol 50 mg p.o. b.i.d.  5. Percocet one tab p.o. b.i.d.  6. Coumadin 5 mg one p.o. daily.  7. Maalox 15-30 mL p.o. q.2 h. p.r.n.  8. Zofran 4 mg IV q.6 h. p.r.n.  9. Ambien 5 mg one p.o. nightly p.r.n.   PHYSICAL EXAMINATION:  VITAL SIGNS:  Blood pressure 107/75, heart rate  is 73, respirations 20, and temperature is 99.6.  GENERAL:  The patient is no acute distress, alert and oriented.  HEENT:  Normocephalic and atraumatic.  Extraocular muscles are intact.  NECK:  Supple. No lymphadenopathy.  LUNGS:  Clear to auscultation bilaterally.  CARDIOVASCULAR:  Regular rate and rhythm.  ABDOMEN:  Flat, soft, nontender.  No bilateral lower quadrants. No  rebound or rigidity.  Positive bowel sounds.  EXTREMITIES:  No clubbing, cyanosis, or edema.   LABORATORY VALUES:  White blood cell count is 12.5, hemoglobin is 11.9,  platelets are 557.  PT is 28.5, INR 1.7.  Sodium 141, potassium 4.7,  chloride 107, CO2 27, glucose 87, BUN 14, creatinine 1.1.  Fecal occult  blood is positive.   IMPRESSION:  1. Abdominal pain.  2. Atrial fibrillation and rapid ventricular response, which is under      control at this time and he is in sinus rhythm.  3. Status post right hip replacement.  Given the patient's history of      the diarrhea and his recent hospitalization as well as abdominal      pain, there is a concern for C. diff.  He was noted to have mild      elevation in his white blood cell count, initially it was 13.8 but      it did increase to 16.2.  At this time, checking for C. diff will      be imperative as the patient is  negative for C. diff and if his      symptoms continue to persist, I will perform flexible      sigmoidoscopy.      Jordan Hawks Elnoria Howard, MD  Electronically Signed     PDH/MEDQ  D:  03/22/2008  T:  03/23/2008  Job:  [id number]   cc:   Deidre Ala, M.D.
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
## 2010-10-02 NOTE — Discharge Summary (Signed)
[name], [name]                ACCOUNT NO.:  [id number]   MEDICAL RECORD NO.:  [id number]          PATIENT TYPE:  INP   LOCATION:  4704                         FACILITY:  [facility]   PHYSICIAN:  [name], M.D.    DATE OF BIRTH:  Jan 09, 1946   DATE OF ADMISSION:  07/26/2008  DATE OF DISCHARGE:  07/30/2008                               DISCHARGE SUMMARY   DISCHARGE DIAGNOSES:  1. Atrial fibrillation with rapid ventricular response.  2. Viral gastroenteritis.  3. Acute renal failure.  4. Urinary tract infection.  5. Acute recent deep vein thrombosis.  6. Chronic obstructive pulmonary disease/asthma.   CHRONIC MEDICAL PROBLEMS:  Cluster headaches; major depression; erectile  dysfunction; polysubstance abuse; gastritis, recurrent; sick sinus  syndrome; coagulopathy; degenerative disk disease, C3-C7, L3-L4, L5-S1;  bilateral hip avascular necrosis; hypertension; and osteoarthritis.   DISCHARGE MEDICATIONS:  1. Amiodarone 200 mg by mouth twice daily for 2 weeks.  2. Amiodarone 200 mg by mouth once daily after completing 2 week      course taken b.i.d.  3. Lopressor 100 mg by mouth twice daily.  4. Xopenex HFA 2 puffs inhaled every 4-6 hours as needed for breathing      difficulty.  5. Advair 250/50 one puff twice daily.  6. Aspirin 81 mg by mouth daily.  7. MS Contin 60 mg by mouth every 12 hours.  8. Coumadin 5 mg until appointment with INR Clinic.  9. Percocet 5/325 by mouth every 6 hours as needed for pain.  10.Prevacid 30 mg by mouth daily.  11.Promethazine 25 mg by mouth every 6 hours as needed for      constipation.  12.Lasix 40 mg by mouth Monday, Wednesday, and Friday.  13.Viagra 50 mg by mouth 30 minutes prior to intercourse.   PROCEDURES PERFORMED:  1. Transesophageal echocardiograph.  2. DC cardioversion.   CONSULTATIONS:  Dr. Hillis Range from [facility].   ADMITTING HISTORY OF PRESENT ILLNESS:  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  Chronic atrial  venous thrombosis prevented catheterization of his atrial flutter.  The  patient states that 1 week prior to admission, he ran out of all his  medications up to Viagra 3 weeks prior to admission.  The patient stated  that he was not feeling well and developed nausea, vomiting, diarrhea,  rhinorrhea, anorexia, fever.  He also had chest pain and dyspnea at that  time, though he did not have any chest pain at presentation.  He noted  occasional palpitations, he did not feel any at presentation.  He stated  he felt dehydrated and notes decreased urination and trouble initiating  his urinary stream, but denies dysuria, hematuria, and denies any  epistaxis, hematemesis, hematochezia, or melena.  He denies edema and  denies cough.   ADMITTING  LABS:  Sodium 138, potassium 3.6, chloride 105, CO2 of 19,  glucose 113, BUN 33, creatinine 2.64.  Predicted GFR for African  American 30.  Bilirubin total 1.3, alkaline phosphatase 71, AST 21, ALT  15, total protein 7.5, albumin in blood 3.8, calcium 9.4.  Red blood  cells 5.12, hemoglobin 16.3,  hematocrit 7, MCV 91.9, RDW 14.8, MCHC  34.7, platelet count is 214.  Neutrophils 100%, lymphocyte 19%,  monocytes 10%, eosinophil 1%, basophil 0.  Absolute neutrophil count  7.9, lipase 47.  Urine suggest highly concentrated urine with evidence  of hyaline cast, bacteria, and white blood cells.  Stat cardiac panels  were negative.  CT of abdomen and pelvis, no significant interval change  from previous study.  No renal calculi.  No bowel obstruction.  Appendix  from terminal ileum appeared to be normal.  Linear artifacts  __________.  No definitive mass or adenopathy seen.  EKG:  Atrial flutter, rate 140,  no ST-T changes visible in the setting of a flutter.  INR 1.7.   HOSPITAL COURSE:  1. Atrial fibrillation with rapid ventricular response.  The patient      developed RVR, most likely secondary to medication noncompliance      and the condition was possibly exacerbated from dehydration.  He      was admitted and a diltiazem drip was started.  Restarted his home      medications of metoprolol and digoxin, and decided to hold      amiodarone.  Subsequently, he developed hypotension and we took him      off metoprolol and changed the diltiazem drip to p.o. diltiazem.      Per recommendations of Dr. Johney Frame, we started him on amiodarone.      Dr. Johney Frame also recommended transesophageal echocardiography to      determine the presence of thrombi in heart chambers.  TEE was      negative and Dr. Johney Frame proceeded with DC cardioversion to revert      the patient back to sinus rhythm.  The patient had DC cardioversion      performed on Friday, March 12.  The patient has been in sinus      rhythm since then with some intermittent paroxysmal atrial      fibrillation with a rate less than 100.  2. Nausea, vomiting, diarrhea, most likely etiology was viral      gastroenteritis.  We also considered Clostridium difficile in the      differential diagnoses.  Based on his previous history of C.      difficile, we decided to avoid use of broad-spectrum antibiotics.      Nausea, vomiting, and diarrhea subsequently subsided.  3. Acute renal failure, most likely prerenal secondary to dehydration      from nausea, vomiting, and diarrhea.  Fractional excretion of      sodium was checked, determined to be 0.5 indicating etiology most      likely to be prerenal.  He was rehydrated and renal function      returned back to baseline.  4. UTI.  The patient did not have typical symptoms of UTI as he was      having streaking  urination and this might have been due to oliguria      from acute renal failure.  He also had difficulty initiating his      urinary stream.  He received 1 dose of Cipro in the ED, which we  decided to discontinue because it might cause recurrence of      Clostridium difficile.  We began treatment with Ceftin.  Decided      not to treat with Bactrim because it interacts with Coumadin.  He      received a 3-day course of Ceftin.  Urine culture indicated, most      likely etiology was E. coli.  His symptoms of UTI resolved on      Thursday, March 11.  5. Recent acute DVT.  Most likely cause of DVT is noncompliance with      Coumadin medication.  We started him on Coumadin with an INR goal      between 2-3.  We also started bridging therapy with Lovenox per      Pharmacy recommendation.  6. COPD, asthma.  The patient indicated that he had not taken      medication for few days.  We noted diffuse wheezes and limited air      movement on exam.  The patient was treated with Advair and      albuterol q.4 h. and symptoms improved significantly throughout his      hospital stay.   DISCHARGE VITALS:  Temperature 97.9, blood pressure 104/84, pulse 68,  respiration 16, O2 sat 98% on room air.   LABS:  Red blood cell count 3.53, white blood cell count 7, hemoglobin  11.1, hematocrit 32.7, platelets count 133.  PT 30.5 seconds, INR 2.7,  PTT 36.  Sodium 141, potassium 4.4, chloride 116, bicarb 20, BUN 11,  creatinine 1.25, glucose 85, calcium 8.5.      Travis Harbor, MD  Electronically Signed      [name], M.D.  Electronically Signed    RV/MEDQ  D:  07/30/2008  T:  07/31/2008  Job:  [id number]   cc:   Hillis Range, MD  Valetta Close, M.D.

## 2010-10-02 NOTE — Discharge Summary (Signed)
NAME:  KLYE, BESECKER NO.:  [id number]   MEDICAL RECORD NO.:  [id number]          PATIENT TYPE:  INP   LOCATION:  2013                         FACILITY:  [facility]   PHYSICIAN:  Manning Charity, MD     DATE OF BIRTH:  Jun 27, 1945   DATE OF ADMISSION:  04/23/2007  DATE OF DISCHARGE:  04/29/2007                               DISCHARGE SUMMARY   DISCHARGE DIAGNOSES:  1. Atrial fibrillation with rapid ventricular response.  2. Hypertension.  3. Recurrent deep venous thrombosis.  4. Renal insufficiency.  5. Chronic obstructive pulmonary disease.  6. Gastroesophageal reflux disease.  7. Leukocytosis.  8. Tobacco abuse.  9. Anxiety.   DISCHARGE MEDICATIONS:  1. Digoxin 0.25 mg, one tab daily.  2. Diltiazem 360 mg, one tab daily.  3. Advair one puff twice daily (250/50 mg).  4. Metoprolol 100 mg, one tab twice daily.  5. MS Contin 60 mg, one tab twice daily.  6. Megace one teaspoonful four times daily.  7. Thiamine 100 grams, one tab daily.  8. Folic acid 1 mg, one tab daily.  9. Coumadin 5 mg, one tab daily.  10.Dulcolax 15 mg, one tab twice daily p.r.n..   NOTATION:  The patient was advised to stop taking the following  medication:  Coreg.   CONDITION ON DISCHARGE:  The patient is discharged in stable condition.  The patient will be following up in the [facility].  He has been instructed to make his own appointment within one  week of discharge.  He will be following up with Dr. [name].  He has been instructed to make an appointment within one week of  discharge.   PROCEDURES PERFORMED:  1. A 2-D echocardiogram performed on April 23, 2007, which showed      overall left ventricular systolic function mildly decreased with      ejection fraction 50%.  Mild diffuse left ventricular hypokinesis.      Left ventricular wall thickness markedly increased.  There is mild      aortic valvular regurgitation.  There is mild aortic  root      dilatation, mildly reduced mitral valve leaflet excursion, left      atrium mildly dilated, right ventricle mildly dilated.  Right      ventricular systolic function moderately reduced.  Right atrium      moderately dilated.  2. Chest x-ray on April 25, 2007, showed pacer in stable and      satisfactory position.  Coarse interstitial densities bilaterally.      Volume loss in the left lung base.  Nodular density in the right      lower lung is stable from previous exam.  No acute changes.  3. Myoview on April 28, 2007, study limited by patient participation      in the exam.  The patient was unable to remain still during the      pharmacological stress portion of the exam.  Reversible ischemia      could not be assessed, due to patient motion.  There seemed to be  no focal defect noted, indicating no evidence of infarction.  Left      ventricular dilatation seen on stress and at rest, prominence of      the right ventricle suggests probable pulmonary hypertension.      There is a global hypokinesis.  Left ventricular ejection fraction      estimated at 29%.   CONSULTATIONS:  Dr. Algie Coffer, Mr. [facility] cardiologist, saw him in the  hospital.   HISTORY OF PRESENT ILLNESS:  Mr. Banas is a 65 year old  African/American man.  He is status post pacemaker for complete heart  block in October 2004.  He has a history of atrial flutter with rapid  ventricular response, hypertension, non compliance with prescribed  medications and substance abuse.  He presented to the [facility] on the day prior to admission with  the complaint of orthopnea and paroxysmal nocturnal dyspnea for the past  week.  He was found to be in atrial fibrillation with rapid ventricular  response, with a heart rate of 160 in the clinic, and he was advised  admission and further workup, but he refused for personal reasons.   The patient presented again to the [facility] on the day of admission with the same complaints;  however, now endorses chest pain, which is central, dull, aching and  intermittent.  The duration is unknown.  It is associated with dyspnea  and palpitations.  The patient is very drowsy and reluctant to answer  questions during exam.   PHYSICAL EXAMINATION:  VITAL SIGNS:  Temperature 97.9 degrees, blood  pressure 132/89, pulse 185, respirations 19, saturation 98% on 2 liters  nasal cannula.  GENERAL:  The patient does not seem to be in acute distress but is  drowsy.  HEENT:  Pupils equal, round, reactive.  Extraocular motion intact.  Oropharynx clear.  NECK:  Supple.  LUNGS:  Air entry is decreased bilaterally with expiratory wheezes.  CARDIOVASCULAR:  Irregularly irregular, tachycardic, with a soft  systolic ejection murmur.  ABDOMEN:  Soft, nontender, non-distended, with positive bowel sounds.  EXTREMITIES:  There is no extremity edema.  NEUROLOGIC:  The patient is non-cooperative with the neurologic exam.   LABORATORY DATA:  Sodium 142, potassium 4.4, chloride 108, bicarb 26,  BUN 26, creatinine 1.3 from a baseline of 1.23 in October, glucose 128.  White blood cell count 12.4, hemoglobin 14.7, hematocrit 43, platelets  159, MCV 93.9, calcium 9.2.  PT 12.6, PTT 23, INR 0.9.  Digoxin level  0.2 which is low.  Point of care markers initially negative.   HOSPITAL COURSE:  #1 - ATRIAL FIBRILLATION WITH RVR:  The patient was  admitted to the intensive care unit.  Cardiac enzymes were cycled and  were negative x3.  TSH was checked and was 1.06 which is normal for this  laboratory.  Urine drug screen positive for opiates and THC, negative  for cocaine.  The patient was initially loaded with diltiazem and  digoxin per cardiology's recommendations.  He was given p.r.n. Lopressor  for heart rate greater than 100.  A 2-D echocardiogram was performed  which showed a normal ejection fraction on April 23, 2007.  The  patient was placed on heparin.  A Myoview performed in the hospital was  a poor study, due to the patient's non-cooperation with the study  itself.  The patient was transferred to a telemetry floor after his  blood pressure stabilized and his heart rate  decreased to the 100's to  120's, with only occasional elevations.  A beta blocker was added to his  regimen, as well as amiodarone.  At the time of discharge the patient  was asymptomatic and demanding to go home; however, was still having  sporadic episodes of tachycardia on telemetry.  He had no shortness of  breath, somnolence or chest pain.  He was reassessed by Dr. Algie Coffer.  He  remained in-hospital until his INR on Coumadin was therapeutic and will  be following for his atrial fibrillation and coumadin therapy with Dr.  Algie Coffer.  He was instructed to make an appointment within one week of  his discharge.   #2 - HYPOTENSION:  Blood pressures on the day of admission dropped below  those initially reported in this discharge summary.  Hypotension  secondary to atrial fibrillation with RVR; however, with fluid  resusitation and stabilization of the heart rate, the blood pressure  returned to normal with 24 hrs of admission.  His lisinopril was held in  hosptial and not resumed on discharge.   #3 - RECURRENT DEEP VENOUS THROMBOSIS AND ATRIAL FIBRILLATION:  The  patient has not been on Coumadin at home secondary to non-compliance  with medications, as well as non-compliance with followup; however,  after discussing  this with Dr. Algie Coffer, the patient agreed to start  Coumadin therapy and remained in the hospital until his INR was  therapeutic.  He was discharged on 5 mg of Coumadin a day, and will be  following with Dr. Algie Coffer for anticoagulation.   #4 - RENAL INSUFFICIENCY:  Creatinine slightly elevated at admission,  back to baseline by time of discharge, most likely secondary to  hypotension with the atrial  fibrillation with RVR.   #5 - CHRONIC OBSTRUCTIVE PULMONARY DISEASE:  This is a stable issue for  this patient.  He reported shortness of air only initially with his  extreme tachycardia.  He was provided with nebulizer treatments in the  hospital and discharged on Advair.   #6 - GASTROESOPHAGEAL REFLUX DISEASE:  The patient was on Protonix in  the hospital and had no complaints of reflux.   #7 - LEUKOCYTOSIS:  Resolved by the time of discharge, without  antibiotic therapy.  No source was identified for the leukocytosis, most  likely secondary to stress of this event.   #8 - TOBACCO ABUSE:  Cessation was recommended.  The patient was  maintained on a nicotine patch in the hospital.  He did not express a  desire to quit after hospitalization.   #9 - ANXIETY:  The patient expressed inability to sleep in the hospital.  Dr. Algie Coffer agreed to write for Xanax for this patient in an outpatient  setting.   DISCHARGE VITAL SIGNS:  VITAL SIGNS:  On the day of discharge  temperature 97.2 degrees, blood pressure 132/83, pulse 58 with some  episodes of tachycardia to the 150's, which were brief, respirations 20.  The patient saturating 97% on 2 liters nasal cannula.   DISCHARGE LABORATORY DATA:  Sodium 141, potassium 4.4, chloride 111,  bicarb 21, BUN 18, creatinine 1.05, glucose 107.  White blood cell count  8.7, hemoglobin 14.1, hematocrit 41.2, platelets 184.  PT 23.9, INR 2.1.      Elby Showers, MD  Electronically Signed      Manning Charity, MD  Electronically Signed    CW/MEDQ  D:  05/27/2007  T:  05/27/2007  Job:  [id number]   cc:   [name], M.D.

## 2010-10-02 NOTE — H&P (Signed)
NAME:  [name], [name] NO.:  [id number]   MEDICAL RECORD NO.:  [id number]          PATIENT TYPE:  INP   LOCATION:  2011                         FACILITY:  [facility]   PHYSICIAN:  [name], M.D.  DATE OF BIRTH:  March 23, 1946   DATE OF ADMISSION:  03/20/2008  DATE OF DISCHARGE:                              HISTORY & PHYSICAL   HOSPITAL LOCATION:  Emergency room #19.   CHIEF COMPLAINT:  Palpitations.   HISTORY OF PRESENT ILLNESS:  This 65 year old black male has a 1-day  history of palpitations.  The patient claims he was sent to the nursing  home after his last admission and no medications were given for 3 days.   PAST MEDICAL HISTORY:  Negative for diabetes mellitus type 2.  Positive  for smoking, chronic obstructive lung disease, atrial flutter-  fibrillation, gastroesophageal reflux disease, and depression.   PAST SURGICAL HISTORY:  Pacemaker placement, superior mesenteric vein  thrombosis treatment, right pulmonary nodule surgery, and recently left  hip surgery.   MEDICATIONS:  1. Metoprolol 100 mg twice daily.  2. Lasix 40 mg Monday, Wednesday, Friday.  3. Potassium 10 mEq Monday, Wednesday, Friday.  4. Diltiazem 240 mg daily.  5. Folic acid 1 mg daily.  6. Lanoxin 0.125 mg daily.  7. Albuterol metered-dose inhaler 2 puffs 4 times daily as needed.  8. Ambien 5 mg at bedtime.  9. Coumadin 5 mg daily.  10.MS Contin 60 mg twice daily.  11.Advair 250/50 one puff twice daily.   ALLERGIES:  PENICILLIN causing rash.   FAMILY HISTORY:  Mother died of asthma and myocardial infarction in her  9s.  Father died in his 65s.  He had history of alcohol use.   SOCIAL HISTORY:  The patient is separated for 35+ years and lives alone.   REVIEW OF SYSTEMS:  The patient denies recent weight gain or weight  loss.  He wears reading glasses.  He has positive history of chronic  obstructive lung disease, also has history of chest pain, questionable  history of GI  bleed, history of arthritis.  No history of stroke,  seizures, or psychiatric admissions.   PHYSICAL EXAMINATION:  VITAL SIGNS:  Pulse 150, after medications down  to 80; respiration 18, blood pressure 90/50 and post improvement in  heart rate and fluid bolus, blood pressure up 130/80.  GENERAL:  The patient is a well-built, adequately nourished black male  in mild distress.  HEENT:  The patient is normocephalic, atraumatic.  Has brown eyes.  Wears glasses.  Ears, nose, throat grossly unremarkable.  NECK:  No JVD, no carotid bruits.  LUNGS:  Clear bilaterally.  HEART:  Normal S1-S2 with grade 2/6 systolic murmur.  ABDOMEN:  Soft and mild generalized tenderness all over the abdomen.  EXTREMITIES:  No edema, cyanosis, or clubbing.  SKIN:  Warm and dry.  NEUROLOGICAL:  The patient moves all 4 extremities.  Cranial nerves  grossly intact.   LABORATORY DATA:  Hemoccult positive.  Lanoxin level was falsely high at  5.7 because it was drawn within 20 minutes of IV injection.  B-  natriuretic peptide was elevated at 318.  BMET was normal.  CBC was  essentially unremarkable except for WBC count of 13,800, hemoglobin  12.5, hematocrit 37.5, platelet count 559,000.  INR was 2.3 on March 18, 2008.   DIAGNOSES:  1. Atrial flutter with rapid ventricular response.  2. Abdominal pain.  3. Chronic obstructive lung disease.  4. Avascular necrosis and right hip chronic pain.  5. Resolving ileus.  6. Postoperative blood loss anemia.  7. Hypertension.  8. Chronic tobacco use disorder.   Plan is to admit the patient to hospital, continue IV Cardizem drip, and  give clear liquids as tolerated.  Advance diet gradually.  Recheck  Lanoxin level in the morning.      [name], M.D.  Electronically Signed     ASK/MEDQ  D:  03/20/2008  T:  03/21/2008  Job:  [id number]

## 2010-10-02 NOTE — Op Note (Signed)
NAME:  [name], [name] NO.:  [id number]   MEDICAL RECORD NO.:  [id number]          PATIENT TYPE:  INP   LOCATION:  2014                         FACILITY:  [facility]   PHYSICIAN:  Hillis Range, MD       DATE OF BIRTH:  May 16, 1946   DATE OF PROCEDURE:  06/17/2008  DATE OF DISCHARGE:                               OPERATIVE REPORT   EP PROCEDURE NOTE.   SURGEON:  Hillis Range, MD   PREPROCEDURE DIAGNOSIS:  Typical-appearing atrial flutter by EKG.   POSTPROCEDURE DIAGNOSES:  1. Typical-appearing atrial flutter by EKG.  2. 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   PROCEDURE:  Aborted EP study.   INTRODUCTION:  Mr. Barrick is a 65 year old gentleman with a history of  persistent atrial fibrillation and typical-appearing atrial flutter by  EKG with rapidly conducting atrial flutter, who presents for EP study  and radiofrequency ablation.  He has a longstanding history of  persistent atrial fibrillation as well as typical-appearing flutter by  EKG.  He previously had an abdominal CT obtained, which revealed diffuse  calcification along the iliac and femoral veins bilaterally.  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  He presents today for EP study and radiofrequency ablation  for atrial flutter.   DESCRIPTION OF THE PROCEDURE:  Informed written consent was obtained and  the patient was brought to the electrophysiology lab in a fasting state.  He was adequately sedated with intravenous Versed and fentanyl as  outlined in the nursing report.  The patient's right and left groins  were prepped and draped in the usual sterile fashion by the EP lab  staff.  Multiple attempts were made to achieve access to the right  common femoral and left common femoral veins.  Though the veins could be  cannulated with clear venous flow, a wire could not be passed  superiorly  through either vein.  I could not obtain enough venous access to perform  venogram.  After extensive attempts to establish access to the lower  venous system, the procedure was aborted.  I presume that he likely has  significant stenosis or occlusion with chronic thromboses of the right  and left common femoral venous anatomy.  There were no early apparent  complications.   CONCLUSIONS:  1. Presumed occlusion/stenosis of the right and left common femoral      venous anatomy with inability to attain      access for EP study.  2. Aborted EP study.  3. DC cardioversion is planned and will be reported separately.      Hillis Range, MD  Electronically Signed     JA/MEDQ  D:  06/17/2008  T:  06/18/2008  Job:  [id number]   cc:   [name], M.D.

## 2010-10-02 NOTE — Discharge Summary (Signed)
NAME:  [name], [name]                ACCOUNT NO.:  [id number]   MEDICAL RECORD NO.:  [id number]          PATIENT TYPE:  INP   LOCATION:  6526                         FACILITY:  [facility]   PHYSICIAN:  [name], M.D.  DATE OF BIRTH:  Jun 18, 1945   DATE OF ADMISSION:  06/01/2007  DATE OF DISCHARGE:  06/03/2007                               DISCHARGE SUMMARY   FINAL DIAGNOSES:  1. Digitalis toxicity.  2. Dehydration.  3. Chronic congestive heart failure.  4. Dilated cardiomyopathy.  5. Chronic obstructive lung disease.  6. Malnutrition.  7. Anxiety.  8. Chronic Coumadin use.   DISCHARGE MEDICATIONS:  1. Lasix 40 mg one on Monday, Wednesday and Friday as needed for leg      swelling.  2. Potassium 10 mEq one on Monday, Wednesday, Friday with fluid pill.  3. Metoprolol 50 mg one twice daily as half of a previous dose.  4. Diltiazem 90 mg one twice daily and to break it half of previous      dose.  5. Megace 200 mg twice daily and to break it half of previous dose.  6. Thiamine 100 mg one daily.  7. Folic acid 1 mg daily.  8. Lanoxin 0.125 mg one daily and to break it half of previous dose.  9. Albuterol metered-dose inhaler 2 puffs four times daily as needed.  10.Ambien 5 mg one at bedtime as needed for sleeping.  11.Coumadin 5 mg one in the evening.  12.Advair 250/50 one puff twice daily.  13.MS Contin 60 mg one twice daily.  14.Dulcolax 15 mg as needed for constipation.   DISCHARGE DIET:  Low-sodium heart-healthy diet.   DISCHARGE ACTIVITY:  The patient to increase activity slowly.   SPECIAL INSTRUCTIONS:  1. The patient to stop any activity that causes chest pain, shortness      of breath, dizziness, sweating or excessive weakness.  2. The patient to get a basic metabolic panel and PT/INR in 1 week.  3. Follow up by Dr. Orpah Cobb in 1 week.  The patient to call [phone] for appointment.   HISTORY:  This 65 year old black male presented with chest pain,  shortness of breath, weakness and mild vision trouble without any speech  problem or loss of consciousness.  He had some chronic cough with some  nausea, no fever, no diarrhea.   PAST MEDICAL HISTORY:  1. Positive for smoking.  2. COPD.  3. Atrial flutter.  4. Depression.  5. He is status post pacemaker placement.   PHYSICAL EXAMINATION:  VITAL SIGNS:  Temperature 97.2, pulse 80,  respirations 16, blood pressure of 102/82.  He is approximately 6 feet  tall and weighs approximately 80 kg.  GENERAL:  The patient is normocephalic, atraumatic, in no significant  distress.  He is well built and well nourished.  HEENT:  He has brown eyes, wears glasses.  Mucous membranes are somewhat  dry.  NECK:  No JVD, no carotid bruit.  LUNGS:  Rare basilar crackles.  HEART:  Normal S1 and S2 with grade 2/6 systolic murmur.  ABDOMEN:  Soft and nontender.  EXTREMITIES:  No edema, cyanosis or clubbing.  SKIN:  Warm and dry.  NEUROLOGICALLY:  Cranial nerves grossly intact.  The patient moves all  four extremities.   LABORATORY DATA:  Normal hemoglobin and hematocrit, WBC count, platelet  count.  Normal electrolytes with BUN slightly high at 34, creatinine  slightly high at 1.8.   EKG was paced rhythm with a left bundle-branch block and intermittent  sinus rhythm with some evidence of junctional rhythm.   HOSPITAL COURSE:  The patient was admitted to the telemetry unit.  Myocardial infarction was ruled out.  His BNP was low.  His condition  improved with gentle hydration and holding Lanoxin.  The Lanoxin level  was 2.2.  Subsequent Lanoxin level will be checked on an outpatient  basis.  His dose will be reduced by 50%.  He ambulated well after 48  hours, and he was discharged home in satisfactory condition with follow  up by me in 1 week.      [name], M.D.  Electronically Signed     ASK/MEDQ  D:  06/03/2007  T:  06/03/2007  Job:  [id number]

## 2010-10-02 NOTE — Assessment & Plan Note (Signed)
[facility]                             PULMONARY OFFICE NOTE   [name], [name]                       MRN:          [id number]  DATE:12/25/2006                            DOB:          Apr 16, 1946    CHIEF COMPLAINT:  Evaluate dyspnea.   HISTORY OF PRESENT ILLNESS:  A 65 year old African American male,  history of chronic shortness of breath for 1 year, getting worse over  time and he is short of breath with both rest and exertion.  He is not  able to go up a flight of steps without having to stop and rest.  He is  coughing up white to brown mucus, still smokes a pack every other day  but has smoked more than this for over 40 years.  He had pneumonia 20  years ago, denies any chest pain.  His weight is down and he has lost  his appetite.  Recent CT of the chest and abdomen in May of this year  was unrevealing.  He has no dysphagia or heart burn.  He is referred for  further evaluation.  He has no nasal congestion, sneezing, itching, ear  ache, anxiety.   PAST MEDICAL HISTORY:  MEDICAL:  1. History of hypertension.  2. History of complete heart block with pacemaker placement in 2004.  3. History of deep venous thrombosis in the right leg in 1990, was on      Coumadin for a period of time, now off.  4. History of chronic headaches.  5. No history of renal disease or sleep disorders.   OPERATIVE:  1. Left hip replacement.  2. Pacemaker placement.  3. Need for right hip replacement but not yet performed.   MEDICATION ALLERGIES:  NONE.   CURRENT MEDICATIONS:  1. Prevacid 30 mg daily.  2. Coreg daily.  3. Digoxin 125 mcg daily.  4. Aspirin 81 mg daily.  5. Morphine 60 mg b.i.d.  6. Stool softener b.i.d.  7. OxyContin p.r.n.   SOCIAL HISTORY:  Smoking history as noted above, he is disabled.  Lives  at home with himself, is widowed.   FAMILY HISTORY:  Heart disease in the mother.   REVIEW OF SYSTEMS:  Otherwise noncontributory.   PHYSICAL EXAMINATION:  This is an elderly male in no distress.  Temperature 98, blood pressure 134/80, pulse 75, saturation 100% room  air.  CHEST:  Showed distant breath sounds with prolonged expiratory phase.  No wheeze or rhonchi were noted.  CARDIAC:  Showed a regular rate and rhythm without S3, normal S1-S2.  ABDOMEN:  Soft, scaphoid, nontender.  EXTREMITIES:  Showed no edema, clubbing, or venous disease.  SKIN:  Clear.  NEUROLOGIC:  Intact.  HEENT:  Showed no jugular venous distention, no lymphadenopathy,  oropharynx clear.  NECK:  Supple.   Spirometry had been obtained from 2005, showed normal spirometry.  Chest  CT scan from May 2008 showed scarring in the left lung, emphysematous  change.   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   PLAN:  1. Begin Advair 250/50 one spray b.i.d.  2. Pursue smoking cessation.  3. Recheck pulmonary functions.  4. Follow up in one month.     Charlcie Cradle Delford Field, MD, [facility]  Electronically Signed    PEW/MedQ  DD: 12/25/2006  DT: 12/25/2006  Job #: [id number]   cc:   Valetta Close, M.D.
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
## 2010-10-02 NOTE — Discharge Summary (Signed)
NAME:  [name], [name] NO.:  [id number]   MEDICAL RECORD NO.:  [id number]          PATIENT TYPE:  OBV   LOCATION:  1827                         FACILITY:  [facility]   PHYSICIAN:  Madaline Guthrie, M.D.    DATE OF BIRTH:  09-Jun-1945   DATE OF ADMISSION:  03/04/2007  DATE OF DISCHARGE:  03/06/2007                               DISCHARGE SUMMARY   DISCHARGE DIAGNOSES:  1. Abdominal pain, secondary to gastritis.  2. Chronic alcohol abuse.  3. Hypertension.  4. History of portal vein thrombosis, now on Lovenox.  5. Sick sinus syndrome, status post pacemaker placement in October      2004.  6. Chronic obstructive pulmonary disease.  7. Bipolar disorder.  8. Status post total hip replacement.   DISCHARGE MEDICATIONS:  1. Protonix 80 mg p.o. daily for 2 weeks.  2. Nicotine patch 40 mg daily for 2 weeks and then 7 mg daily for next      2 weeks.  3. Megace 1 teaspoon orally 4 times daily.  4. MS Contin 60 mg twice daily.  5. Percocet 10/650 mg as needed for pain every 6 hours.  6. Senokot.  7. Advair 250/50 twice daily.  8. Viagra as needed.  9. K-Dur 40 mEq p.o. daily.  10.Thiamine 100 mg p.o. daily.  11.Folic acid 1 mg p.o. daily.  12.Coreg 6.25 mg p.o. twice daily.  13.Phenergan 12.5 mg orally as needed every 6 hours.  14.Magnesium oxide 40 mg p.o. 3 times daily for 2 days.  15.Ciprofloxacin 500 mg p.o. twice daily for 6 days.   DISPOSITION AND FOLLOWUP:  The patient will call the internal medicine  clinic at [facility] for an appointment with Dr. Aleene Davidson within next 2  weeks.  He will need to be followed up in regards to his abdominal pain  related to gastritis and his chronic alcohol abuse.   ADMITTING HISTORY AND PHYSICAL:  This is a 65 year old man with multiple  medical problems notably, alcohol abuse with history of withdraw, sick  sinus syndrome status post pacemaker placement 2004, history of atrial  flutter in addition to a history of right lower  extremity DVT and portal  vein thrombosis, hypertension, and COPD.  On his way to pick up his  grandson with his fiancee this morning, the patient experienced a sudden  onset of abdominal pain without radiation.  The pain was diffuse,  constant, and gradually improved during the day.  He also developed  nausea and vomiting x10 with yellow content with no blood or coffee-  ground material.  He tried Pepto-Bismol for his nausea and vomiting, but  it failed to potentially resolve.  He also had yellow diarrhea x3 during  the day.  He endorses recent palpitations and diaphoresis, but no chest  pain or syncope.  He has had no shortness of breath.  The patient did  not note any sick contacts or recent restaurant visits.  He does admit  to drinking a mini bottle of vodka the day of admission.   ADMITTING PHYSICAL EXAMINATION:  VITAL SIGNS:  Temperature 97.9, blood  pressure  189/116, pulse 78, respirations 24, and saturating 98% on 2 L.  GENERAL:  This is a middle-aged [place] American man who is resting  comfortably.  EYES:  Pupils equally round and reactive.  Anicteric with pink  conjunctivae.  Extraocular eye movements, intact.  ENT:  Oropharynx is erythematous, but no edema or exudate.  PULMONARY:  Respirations, good air movement without rhonchi or crackles.  No wheezing.  CARDIOVASCULAR:  Regular rate and rhythm.  No murmurs, rubs, or gallops.  GI:  Abdomen is soft with positive bowel sounds.  There is no  distension, but there is significant tenderness that is diffuse with  guarding in the epigastrium.  No rebound.  No masses palpated.  EXTREMITIES: +2 peripheral pulses, the radials, posterior tibials.  No  edema, clubbing, or palmar erythema.  NEUROLOGIC:  Alert and oriented x3.  Cranial nerves II through XII are  grossly intact.  Strength is +5 in all extremities.   LABORATORY STUDIES:  Sodium 143, potassium 3.4, chloride 104, bicarb 32,  BUN 12, and creatinine 1.1.  Hemoglobin 9.3,  white count 9.6.  Bilirubin  1.6 with that being indirect, alkaline phosphatase 75, AST and ALT 27  and 14 respectively, protein of 8.4, and albumin 12.5.  Calcium 9.5,  magnesium 1.7, and lipase 25.  UA indicating leukocyte esterase is  positive.  Positive nitrite.  Small hemoglobin 3 to 6 per high-power  field.  Negative red cells.   HOSPITAL COURSE BY PROBLEM:  1. Abdominal pain.  The differential for the patient's abdominal pain      originally included gastritis, peptic ulcer disease, hepatitis, and      cholecystitis.  Negative lipase, rule out essentially the      possibility of pancreatitis.  The possibility of diverticulitis and      appendicitis was also low on the differential given the patient's      lack of fever, leukocytosis, and fact that his pain has improved      significantly since it started.  A right upper quadrant abdominal      ultrasound showed no evidence for gallstones or acute      cholecystitis.  There is no biliary ductal dilation.  Abdominal CT      indicated no acute abdominal findings, but indication for chronic      portal vein thrombosis with cavernous transformation.  Over the      course of 24 hours, the patient's abdominal pain completely      subsided.  He was feeling well by the day of discharge.  Given his      history of alcohol abuse, we would like to schedule him for an EGD.      He has had a colonoscopy within the last 2 years.  If this is      consistent with either an episode of gastritis or peptic ulcer      disease, again he needs an upper endoscopy in the future if his      abdominal pain returns, for exploration of those 2 possibilities.  2. Diarrhea.  The patient's episode of diarrhea resolved by day of      discharge.  His diarrhea actually is self-limiting and likely      related to recent infection.  I think he will need to be followed      up on his symptoms at his next outpatient visit with Dr. Aleene Davidson.  3. Chronic alcohol abuse.   The patient was maintained on thiamine and  folate and CIWA protocol during his hospitalization.  We counseled      the patient on the need to quit drinking and the health      ramifications of continued alcohol abuse in his life.  It appeared      the patient was not ready to completely give up the habit; however,      we will continue to work with the patient in regards to cessation      of alcohol abuse in the future.  4. Hypertension.  The patient's systolic blood pressures were      maintained 100 to 140 by the day of discharge.  He was discharged      on his home antihypertensives, which include Coreg 6.25 mg twice      daily.  5. Chronic portal vein thrombosis.  The patient has been in the past      treated with Coumadin for his thrombosis.  We maintained the      patient on Lovenox during his hospitalization.  6. COPD.  The patient has been maintained on Advair at home.  He was      saturating very well during his hospitalization as well as the      patient's COPD is stable at this point.  Again, he will need      outpatient followup for this chronic problem.   DISCHARGE LABS:  Sodium 140, potassium 3.1, chloride 105, bicarb 29, BUN  10, creatinine 1.23, and glucose 80.  Hemoglobin 15.4, white count 9.2,  and platelets 156.      Lollie Sails, MD  Electronically Signed      Madaline Guthrie, M.D.  Electronically Signed    CB/MEDQ  D:  04/19/2007  T:  04/20/2007  Job:  [id number]   cc:   Valetta Close, M.D.

## 2010-10-02 NOTE — Discharge Summary (Signed)
NAME:  [name], SCHMADER NO.:  [id number]   MEDICAL RECORD NO.:  [id number]          PATIENT TYPE:  INP   LOCATION:  6736                         FACILITY:  [facility]   PHYSICIAN:  Alvester Morin, M.D.  DATE OF BIRTH:  May 13, 1946   DATE OF ADMISSION:  09/25/2008  DATE OF DISCHARGE:  09/27/2008                               DISCHARGE SUMMARY   DISCHARGE DIAGNOSES:  1. Diarrhea of unclear etiology, possibly Clostridium difficile      infection.  2. Atrial fibrillation.  3. Hypokalemia.  4. Anemia.  5. Tobacco abuse.  6. Alcohol abuse.  7. Gastroesophageal reflux disease.  8. Hypertension.  9. Chronic pain syndrome.  10.Chronic obstructive pulmonary disease.   DISCHARGE MEDICATIONS WITH ACCURATE DOSES:  1. Prevacid 30 mg once a day.  2. Aspirin 81 mg once a day.  3. Percocet 10/650 mg 1 tablet every 6-8 hours as needed for pain.  4. Advair 250/50 one puff twice a day.  5. Warfarin 5 mg once a day until further instructed.  6. ProAir HFA 1-2 puffs every 4-6 hours as needed for shortness of      breath.  7. Cialis 10 mg 1 tablet by mouth at least 1 hour prior to planned      sexual activity and not more than 12 hours before planned sexual      activity.  8. Lopressor 50 mg 1 tablet twice a day.  9. MS Contin 15 mg 2 tablets twice a day.  10.Amiodarone 200 mg 1 tablet once a day.  11.Flagyl 500 mg 1 tablet 3 times a day for the next 10 days.  12.Lovenox injection 90 mg subcutaneously once a day for next 3 days.   DISPOSITION AND FOLLOWUP:  The patient will follow with Dr. Chancy Milroy at  the [facility] on Monday, Oct 03, 2008 at 2:30 p.m.  The patient to  follow up with Dr. Polly Cobia on Oct 05, 2008 at 1:30 p.m.  The patient is  to repeat hemoglobin and INR checked on next outpatient clinic visit.   PROCEDURES PERFORMED:  Acute abdominal series on Sep 25, 2008 that showed  improved aeration, stable pleura, and parenchymal scarring on the left.  No bowel  obstruction or free air.   CONSULTATIONS:  None.   BRIEF ADMITTING HISTORY AND PHYSICAL:  The patient is a 65 year old  African American male with complicated past medical history.  The  patient had treatment of C. diff colitis in early December 2009 with  Flagyl for 10 days.  The patient was again emperically treated for C.  diff infection in January with oral vancomycin therapy for 14 days.  The  patient also had recent hospitalization with the E. coli infection in  urinary tract as well as pneumonia for which he was treated with  ciprofloxacin in February 2010.  The patient on this admission  complaints of diarrhea for the last one week.  He reports 10-15 loose,  foul smelling, nonbloody bowel movements per day.  The patient also  reports nausea and vomiting with abdominal pain, concomitant with the  reported  diarrhea.  The patient came to emergency department on Sep 23, 2008 and was sent home with instructions to take Imodium and Mylanta.  The patient reports no relief with this therapy.  The patient reports  not taking his oral medications due to nausea and vomiting for last 1  week.  The patient denies fevers but endorses chills.  The patient does  not report any sick contact, recent travel, or well water use.  The  patient's last report antibiotic use was in February 2010 with  ciprofloxacin.   PHYSICAL EXAMINATION:  VITAL SIGNS:  On admission, temperature 97.2,  blood pressure 177/82, pulse of 83, and O2 saturation of 99% on room  air.  GENERAL:  The patient is ill-appearing but in no acute distress.  EYES:  EOMI, PERRLA, and pale conjunctivae.  ENT:  Tympanic membrane clear.  Nares clear.  Oral exam shows dry oral  mucosa.  No exudates.  NECK:  Supple.  No thyromegaly.  No JVD.  No lymphopathy.  Full range of  motion.  No meningeal signs.  RESPIRATORY:  Clear to auscultation with good bilateral air entry.  No  wheezing, no rales, and no rhonchi.  CARDIOVASCULAR:  Regular  rate but irregular rhythm.  S1 and S2 normal.  No murmur, no rub, no gallop, no bruit.  GI:  Soft abdomen with generalized mild tenderness in all quadrants.  No  hepatosplenomegaly.  No masses.  Active bowel sounds in all quadrants.  LYMPHATIC:  No axillary or inguinal lymphadenopathy appreciated.  MUSCULOSKELETAL:  The patient has normal gait.  Muscle tone is normal.  No joint swelling.  No joint erythema or effusion noted.  SKIN:  The patient's skin is clear without signs of any rashes, lesions,  or ulcerations.  NEUROLOGIC:  Alert and oriented x3.  Cranial nerves II-XII intact.  No  motor or sensory focal deficits.  Cerebellar signs negative.  Gait  normal.  PSYCH:  The patient is appropriate.   LABORATORY DATA:  On admission, white count 7.3, hemoglobin of 15.1,  hematocrit of 39.2, and MCV of 89.2.  Sodium of 143, potassium of 3.1,  chloride of 110, bicarb of 26, glucose of 93, BUN of 4, and creatinine  1.4.  Bilirubin of 1.1, alkaline phosphatase 86, AST 15, ALT 8, total  protein 7.1, albumin of 3.2, lipase of 24, PT of 15.0, and INR of 1.1.  Abdominal x-ray, results as discussed in procedures.  The patient also  had a recent CT abdomen and pelvis on Sep 23, 2008 at the time of  emergency room visit which showed one calcified area of pleural  thickening at the left lung base and persistent area of rounded  atelectasis.  Geographic fatty infiltration of the liver.  Stable small  attenuation of liver lesions which are likely cysts.  Chronic cavernous  transformation of portal vein.  Stable peritoneal and celiac axis lymph  nodes.  No acute abdominal findings or mass lesions.   HOSPITAL COURSE:  1. Diarrhea.  The patient was admitted for diarrhea with suspicion of      C. diff re-infection.  The patient was clinically dehydrated and      provided normal saline infusions.  The patient was admitted to the      tele bed and stool was sent for occult blood, parasites and ova      exam,  culture, Gram-stain, and C. diff toxin immunoassay.  The      patient was placed on contact isolation and  empirically treated      with vancomycin.  The patient's urine and blood were sent for      culture.  The patient remained stable overnight and responded to      oral vancomycin therapy with quick resolution of diarrhea.  The      patient had only two bowel movements in subsequent 2 days.  The      patient's medical records and lab data from previous admissions for      C. diff infections were reviewed.  The patient had only one      documented C. diff positive immunoassay in past 3 admissions.  The      patient was therefore treated with metronidazole 500 mg 3 times a      day for next 14 days at the time of discharge.  The patient would      follow up at the outpatient clinic for resolution of his diarrhea.      The patient at the time of discharge had no signs of dehydration      was tolerating his food without any nausea or vomiting.  2. Atrial fibrillation.  The patient was rate controlled at the time      of admission.  The patient had EKG that showed sinus bradycardia      but no signs of acute myocardial infection.  The patient was      continued on Lopressor and amiodarone therapy.  The patient was      subtherapeutic for his INR.  The patient was started on warfarin      therapy and bridged with Lovenox per pharmacy.  Dr. Johney Frame, who is      his cardiologist also visited the patient during this      hospitalization and recommended decreasing the Lopressor dose from      100 mg to 50 mg twice a day.  The patient at the time of discharge      was instructed to take only 50 mg twice a day.  The patient was      instructed to continue taking Lovenox shot for next 3 days.  The      patient will also continue to take warfarin 5 mg once a day.  The      patient was instructed that concomitant use of metronidazole may      increase his INR.  Therefore, he should be watchful for signs  of      bleeding and report to the emergency department or outpatient      clinic if he experiences any bleeding in vomit or in the stool.      The patient also was provided funding to help obtain Flagyl as well      as Lopressor at the time of discharge from the outpatient clinic.      The patient reports being out of amiodarone and does not have fund      to obtain it until first of the next month.  Dr. Johney Frame is aware of      the patient's inability to purchase amiodarone at this time and      will follow up on outpatient basis.  3. Hypokalemia.  The patient had hypokalemia on admission likely      secondary to excessive vomiting and diarrhea.  The patient was      provided potassium and magnesium supplementation.  The patient's      hypokalemia had resolved at the time of discharge.  4. Tobacco abuse.  The patient was given smoking cessation consult and      nicotine patch during this admission.  The patient would continue      to follow in the outpatient clinic.  5. Alcohol abuse.  The patient denies recent alcohol use but has      history of alcohol abuse.  The patient reports his last alcohol      intake was 3 months ago.  The patient was continued to monitor for      withdrawal signs which he did not demonstrate during this      hospitalization.  6. GERD.  The patient was continued on Protonix therapy on the      admission.  The patient did not have any signs of GERD.  The      patient was instructed to continue taking Prevacid as prescribed by      outpatient clinic at the time of discharge.  7. Hypertension.  The patient was continued on Lopressor therapy      during this hospitalization.  The patient's blood pressure was      adequately controlled during this hospitalization with systolic      blood pressure between 135-120 on most occasions.  The patient was      instructed to take Lopressor 50 mg twice a day at the time of      discharge as per Dr. Jenel Lucks  recommendation.  8. Chronic pain.  The patient had generalized myalgia for which the      patient was given morphine intravenously during this      hospitalization.  At the time of discharge, the patient was      instructed to continue taking his MS Contin and Percocet as      prescribed from outpatient clinic.   DISCHARGE VITAL SIGNS:  Temperature 97.9, blood pressure 122/74,  respiratory rate 20, oxygen saturation 98% on room air with a pulse of  62.      Clerance Lav, MD PhD  Electronically Signed      Alvester Morin, M.D.  Electronically Signed    RS/MEDQ  D:  09/27/2008  T:  09/28/2008  Job:  [id number]   cc:   Hillis Range, MD  Valetta Close, M.D.

## 2010-10-02 NOTE — Consult Note (Signed)
NAME:  [name], [name] NO.:  [id number]   MEDICAL RECORD NO.:  [id number]          PATIENT TYPE:  INP   LOCATION:                               FACILITY:  [facility]   PHYSICIAN:  Hillis Range, MD       DATE OF BIRTH:  April 15, 1946   DATE OF CONSULTATION:  DATE OF DISCHARGE:                                 CONSULTATION   REASON FOR CONSULTATION:  Atrial flutter and pacemaker at elective  replacement interval.   HISTORY OF PRESENT ILLNESS:  [name] is a 65 year old gentleman with  a history of persistent atrial fibrillation and rapidly-conducting  atrial flutter, status post implantation of a [employer] pacemaker  by Dr. Charolette Child on February 25, 2003, for tachycardia-bradycardia  syndrome.  He was admitted for rate control of atrial flutter and  possible pulse generator replacement.  The patient has a long-standing  history of noncompliance with medicine and clinical followup.  He  recently was admitted with Clostridium difficile colitis from June 01, 2008, through June 05, 2008.  During his hospital stay, he was  noted to be in atrial fibrillation with rapid ventricular rates.  Subsequent interrogation of his pacemaker revealed ERI battery status  with a voltage of 2.33 volts.  The right ventricular lead was a [employer]  model [id number] passive fixation lead (serial [id number]).  The unipolar  pacing threshold was 2.75 volts at 0.5 milliseconds with impedance of  379 ohms.  The bipolar threshold was 3.5 volts at 0.5 milliseconds with  an R wave of 7 millivolts and an impedance of 545 ohms.  The patient was  therefore readmitted for pulse generator replacement.  Upon arrival, he  was noted to have rapidly-conducting typical-appearing atrial flutter  with ventricular rates in the 150s.  He reports symptoms of fatigue and  shortness of breath.  He denies chest pain, palpitations, presyncope, or  syncope.  He has long-standing persistent atrial fibrillation  and  appears to have tolerated this quite well.  He is chronically  anticoagulated with Coumadin because recent INRs have been  subtherapeutic.  He denies any recent cerebrovascular symptoms.   PAST MEDICAL HISTORY:  1. Permanent atrial fibrillation and typical-appearing atrial flutter      with rapid ventricular rates.  2. Pacemaker at ERI (as above).  3. Gastroesophageal reflux disease.  4. Diabetes.  5. Chronic obstructive pulmonary disease, long-standing history of      tobacco use which is ongoing.  6. History of superior mesenteric venous thrombosis.  7. Chronic left lower lobe effusion.  8. Preserved ejection fraction.  9. Avascular necrosis of the hip, status post surgery in 2009.  10.Recent hospitalization for Clostridium difficile colitis.  11.Marijuana use.   ALLERGIES:  PENICILLIN.   CURRENT MEDICATIONS:  1. Metoprolol XL 100 mg daily.  2. Digoxin 0.125 mg daily.  3. Diltiazem 180 mg b.i.d.  4. Lasix 40 mg daily.  5. Aspirin 81 mg daily.  6. Protonix 40 mg daily.  7. Tylox p.r.n.  8. Ambien 5 mg at bedtime p.r.n.  9. Diltiazem drip.  SOCIAL HISTORY:  The patient lives in [place].  He continues to smoke  1 pack per day and has done so for a long time.  He has a history of  marijuana use and denies recent alcohol consumption.   FAMILY HISTORY:  Notable for hypertension and diabetes.   REVIEW OF SYSTEMS:  The patient is noncooperative with a review of  systems exam today.   Telemetry reveals rapidly-conducting atrial flutter with 2:1 conduction  at 150 beats per minute.   PHYSICAL EXAMINATION:  VITAL SIGNS:  Blood pressure 106/50, heart rate  150, respirations 16, sats 99% on 2 L, and afebrile.  GENERAL:  The patient is hostile and difficult to assess the patient due  to resistance with a physical exam.  HEENT:  Normocephalic and atraumatic.  Sclerae clear.  Conjunctivae  pink.  Oropharynx clear.  NECK:  Supple.  No thyromegaly, lymphadenopathy, JVD, or  bruits.  LUNGS:  Decreased breath sounds at the left base, otherwise clear.  A  prolonged expiratory phase is noted.  HEART:  Tachycardic.  Irregular rhythm.  No murmurs, rubs, or gallops.  GI:  Soft, nontender, and nondistended.  Positive bowel sounds.  EXTREMITIES:  No clubbing, cyanosis, or edema.  NEUROLOGIC:  Cranial nerves II-XII are intact.  Strength and sensation  are intact.  SKIN:  No ecchymosis or lacerations.  MUSCULOSKELETAL:  No deformity or atrophy.  PSYCH:  Depressed mood and hostile affect.   LABORATORY STUDIES:  Creatinine 1.48 and INR 1.4.  White blood cell  count 9, hematocrit 40, and platelets 193.  TSH 0.927.   EKG reveals typical-appearing atrial flutter with a 2:1 ventricular  conduction and a ventricular rate of 150 beats per minute.  LVH is noted  otherwise unremarkable.   Chest x-ray reveals a right-sided single-chamber pacemaker with a right  ventricular lead over the right ventricle.  There is a chronic left  pleural effusion.  There is no acute airspace disease.   Transthoracic echocardiogram dated March 10, 2008, reveals a left  ventricular end-diastolic dimension 43, left atrial size 48, IVS 14,  posterior wall 11, ejection fraction 55% with no regional wall motion  abnormalities.  There is no significant valvular disease.   IMPRESSION:  [name] is a 65 year old gentleman with permanent atrial  fibrillation and rapidly-conducting atrial flutter, who presents today  with a pacemaker at elective replacement indicator battery status.  He  previously had this implanted by Dr. Aleen Campi in 2004 for tachycardia-  bradycardia syndrome.  He has done quite well with a single-chamber  pacemaker.  I think that as he has had significant difficulties with  rate control of his atrial flutter that we should proceed with atrial  flutter ablation.  The patient is aware that he will continue to have  atrial fibrillation and will likely require rate controlling  medications  as well as lifelong anticoagulation with Coumadin for his atrial  fibrillation.  The patient's pacemaker is at elective replacement  indicator and therefore should be replaced.  He appears to have a high  threshold from the right ventricular lead.  I think that if his right  subclavian vein is patent then we should consider replacing this lead.  However, if the vein is not patent, then we may simply just replace the  generator   PLAN:  Therapeutic strategies for ablation of atrial flutter as well as  pulse generator replacement with lead revision were discussed in detail  with the patient today.  The risks include but  are not limited to  bleeding, infection, pneumothorax, pericardial effusion with tamponade,  perforation, renal failure, vascular damage, lead dislodgement, damage  to the normal conduction system, stroke, and death.  The patient  understands these risks and wishes to proceed.  We will therefore plan  for a transesophageal echocardiogram to rule out thrombus.  If he has a  thrombus, we would then proceed to atrial flutter ablation later today  as well as pulse generator replacement with lead revision if possible.       Hillis Range, MD  Electronically Signed     JA/MEDQ  D:  06/17/2008  T:  06/18/2008  Job:  [id number]   cc:   [name], M.D.

## 2010-10-02 NOTE — Consult Note (Signed)
NAME:  [name], [name] NO.:  [id number]   MEDICAL RECORD NO.:  [id number]          PATIENT TYPE:  INP   LOCATION:  2607                         FACILITY:  [facility]   PHYSICIAN:  Juanetta Gosling, MDDATE OF BIRTH:  [birth year]   DATE OF CONSULTATION:  DATE OF DISCHARGE:                                 CONSULTATION   CONSULTING PHYSICIAN:  Acey Lav, MD   CHIEF COMPLAINT:  Inability to have a bowel movement or flatus for the  last 13 days.   HISTORY OF PRESENT ILLNESS:  This is a 65 year old male with a very  extensive past medical history who has several recent admissions to the  hospital, last being last couple weeks where he was diagnosed with COPD  exacerbation and a possible HAP.  He was discharged not too long ago  from that.  He is on chronic pain medications including significant  amounts of MS Contin and Percocets and is not the very most mobile of  the patient's either.  He complains that the for last 13 days, he has  had inability to pass a bowel movement, he has had a very small amount  of flatus during these 13 days, however, he complains of no nausea or no  vomiting.  He has been tolerating a diet and eating normal meal  throughout this period according to him.  He has never had anything like  this before.  He states he has had a colonoscopy in the last several  years.  They cannot remember exactly where that was done or who did it.  He also denies any fevers associated with this.  His other main  complaints are just what he describes as pains all over.   PAST MEDICAL HISTORY:  1. AFib with multiple hospitalizations for rapid ventricular response.  2. History of C. diff colitis in 2009 and 2010.  3. History of E. Coli, urosepsis.  4. Cluster headaches.  5. COPD.  6. Major depression.  7. Erectile dysfunction.  8. Sick sinus syndrome.  9. 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  10.Degenerative disk disease.  11.Hypertension.   PAST SURGICAL HISTORY:  1. Pacemaker.  2. Bilateral total hip arthroplasties for AVN.  He has had no prior      abdominal surgery.   MEDICATIONS:  1. Prevacid 30 daily.  2. Aspirin 81 daily.  3. Percocet.  4. Advair.  5. Coumadin.  6. MS Contin.  7. Amiodarone.  8. Compazine.  9. Cardizem.  10.Atrovent.  11.Albuterol.  12.Vancomycin.   ALLERGIES:  PENICILLIN.   SOCIAL HISTORY:  Positive for smoking, positive for marijuana usage and  no alcohol.   REVIEW OF SYSTEMS:  Also significant for shortness of breath with  exertion as well as urinary hesitancy.   PHYSICAL EXAMINATION:  VITAL SIGNS:  94, 86, 129/81, 13 and 99.  He has  had 375 mL urine output since has been on 2600.  GENERAL:  He is alert  and is angry male, currently.  NECK:  Without  lymphadenopathy.  HEART:  Appears to be regular right now.  LUNGS:  Decreased at the bases, has occasional respiratory wheeze.  ABDOMEN:  Mildly distended, tympanitic.  His bowel sounds are present.  He is mildly tender in lower quadrants without guarding or peritoneal  signs.  GU:  Shows no inguinal hernias.  EXTREMITIES:  No edema.  RECTAL:  He refused my rectal examination as he said this was performed  earlier.   LABORATORY EVALUATION:  White blood cell count 11.6, hematocrit 38.4,  and platelets 189.  Urinalysis is negative.  His BMET yesterday is  sodium 138, potassium 4.7, BUN is 17, creatinine 1.26, glucose of 81.  His INR is 2.6, magnesium 2.3.  Fecal occult blood test negative.  Liver  function test showing albumin of 2.7, total bilirubin is 0.8, alkaline  phosphatase 52, and his transaminases are 25 and 26   RADIOLOGY:  Acute abdominal series showed dilated small bowel with air-  fluid levels and stool in the colon.  They had diffusely dilated small  and large bowel with no free fluid present.   ASSESSMENT:  Ileus versus obstruction.   PLAN:   Clinically and by CT scan with no prior abdominal surgery, no  hernias at all and what appears to be a normal colonoscopy the last  couple of years, he may just have a very significant ileus secondary to  mobility and narcotics.  The plan would be to treat him for an ileus  currently and I will attempt to give him a couple of soapsuds enemas and  see if along with the contrast this helps to move anything through.  We  will repeat his KUB in the morning.  He has refused an NG tube and I  think it would be reasonable given the dilation of his small bowel and  his distention on examination and that certainly may be helpful, but he  has refused that currently.  We will continue to follow along and to be  sure that this is not an actual obstruction that will need operative  treatment.  I would also let his INR decrease and begin him on IV  heparin in case if there is any need for any procedure.     Juanetta Gosling, MD  Electronically Signed    MCW/MEDQ  D:  12/22/2008  T:  12/23/2008  Job:  [id number]   cc:   Acey Lav, MD

## 2010-10-02 NOTE — Discharge Summary (Signed)
NAME:  [name], [name] NO.:  [id number]   MEDICAL RECORD NO.:  [id number]          PATIENT TYPE:  INP   LOCATION:  2006                         FACILITY:  [facility]   PHYSICIAN:  Madaline Guthrie, M.D.    DATE OF BIRTH:  Aug 10, 1945   DATE OF ADMISSION:  11/22/2008  DATE OF DISCHARGE:  11/28/2008                               DISCHARGE SUMMARY   DISCHARGE DIAGNOSES:  1. Atrial flutter with rapid ventricular rate.  2. chronic obstructive pulmonary disease, acute exacerbation.  3. Acute renal failure.  4. Hypertension.  5. Chest pain.  6. Tobacco abuse.  7. Pulmonary nodule.   DISCHARGE MEDICATIONS:  1. Prevacid 30 mg p.o. daily.  2. Baby aspirin 81 mg p.o. daily.  3. Percocet 10/650 one tablet p.o. q.6-8 h. p.r.n. for pain.  4. Advair Diskus 250/50 mcg per dose 1 puff b.i.d.  5. Coumadin 5 mg on Monday, Wednesday, Friday and 7.5 mg Tuesday,      Thursday and Saturday as well as Sunday p.o.  6. ProAir 90 mcg 1-2 puffs q.4 h. p.r.n. for shortness of breath.  7. Cialis 10 mg 1 hour and no more than 12 hours before planned sexual      activity.  8. Metoprolol 25 mg p.o. b.i.d.  9. MS Contin 15 mg 2 tablets b.i.d.  10.Amiodarone 200 mg 1 tablet daily.  11.Promethazine 12.5 mg t.i.d. p.r.n. for nausea and vomiting.  12.Prednisone 10 mg 3 tablets daily for first 2 days, 2 tablets daily      for next 2 days, 1 tablet daily for next 2 days, and then stop.  13.Cardizem 360 mg XR, 24-hour tablet, 1 tablet by mouth daily.  14.Spiriva HandiHaler 18 mcg 1 puff daily.  15.NicoDerm 14 mg patch 1 patch daily.   Medications changed from prior to admission at the time of discharge  include the following:  1. Metoprolol dose is decreased from 50 b.i.d. to 25 b.i.d.  2. Prednisone has been started.  3. There was some concern for hypotension from Cardizem in the past.      At this time, the patient tolerated Cardizem well and he is started      on new medication Cardizem 360 mg  long acting once daily.  4. Spiriva HandiHaler.  5. NicoDerm.   DISPOSITION AND FOLLOWUP:  [name] will follow up with Dr. Aleene Davidson  in [facility] on July 21 at 1:30 p.m.   FOLLOWUP ISSUES:  1. Atrial flutter with rapid ventricular rate.  Please check on the      heart rate and also note that the patient was recently started on      long-acting Cardizem 360 mg once daily.  If heart rate is still not      controlled and we need to increase the dose of his medications,      options include we can increase the dose of Cardizem for 480 mg      once daily tablet or increase the dose of metoprolol or increase      the dose of amiodarone to 300 mg  daily.  But, please monitor his      blood pressure while we increase the dose of his other medication.      Please check CBC and BMET to see if the patient has any      leukocytosis as well as to check his renal function and potassium.  2. Please assess this patient's breathing as he was admitted for COPD      acute exacerbation and followup on the steroid use and persistent      symptoms.  3. Please follow up for any persistent chest pain.  Please also look      at the discharge summary from July 1 done by Dr. Aris Lot,      which has some other followup issues pending.   CONSULTATION DONE DURING THIS HOSPITALIZATION:  None.   PROCEDURE DONE DURING THIS HOSPITALIZATION:  Chest x-ray done on November 22, 2008, is positive for persistent left pleural effusion and left mid to  lower lung zone pneumonia, which on further review by the primary team  was stable persistent finding for [name] for long time.   BRIEF HISTORY OF PRESENT ILLNESS:  [name] is a 65 year old man with  past medical history of AFib with RVR with failed cardioversion, sick  sinus syndrome, status post pacemaker placement, COPD with coagulopathy  with history of DVT in February 2010, was discharged from the hospital 5  days prior to admission for  diarrhea, nausea, vomiting, and acute renal  failure, who is admitted from clinic today after he is getting shortness  of breath with chest pain for 3 days prior to admission, but he elected  to wait for his appointment.  Walking into the clinic, he became very  short of breath and had some general weakness and chest pain.  He  describes his pain as like heartburn in the center of the chest, which  radiated to the epigastrium and then back into the central sternum.  The  pain is sharp and then dull, pain lasted about 30 minutes associated  with sweating.  Denies any nausea or vomiting, had a chronic dry cough.  The patient is an active smoker right now.  Denies any fever, but has  some sweats.  No sick contacts, and the patient did miss his medication  on the day of admission.   1. Atrial flutter with rapid ventricular rate.  [name] is well      known to the admitting team and he was noted to have hypertension      from Cardizem, so at admission his heart rate was at 140, and he      was started on amiodarone loading dose and later transitioned to      the maintenance dose and later switched to p.o. amiodarone.  But      note that his heart rate was not controlled.  The patient was      admitted few days back with similar symptom, but treating with      amiodarone likewise controlled his heart rate.  Later, we wanted to      try how his blood pressure does with Cardizem and we started him      initially on long-acting Cardizem.  But later, the primary team      decided to change the plan and started on short-acting Cardizem and      gradually increased the dose.  We initially started 30 mg p.o. q.6  h., and we finally need to titrate the dose to 90 mg q.6 h., which      is equivalent to 360 mg per 24 hours, which finally controlled his      heart rate.  The patient did tolerate his blood pressure well and      had no episode of hypertension.  2. Chronic obstructive pulmonary  disease, acute exacerbation, this was      treated with IV steroids, which was later transitioned to      prednisone nebulization plus moxifloxacin.  The patient did      complete his antibiotics course in the hospital and he is      discharged home with some tapering dose of steroids.  3. Chest pain.  We cycled cardiac enzymes and repeated EKG, and there      was no any acute changes and there was no increase of      cardiomarkers, and the patient's chest pain resolved after his      breathing symptoms improved.  4. Acute renal failure.  This resolved with some IV hydration.  5. Hypertension.  This was controlled with his home medications and      his blood pressure remained pretty well.  6. Tobacco abuse.  Cessation consult was a done, and the patient was      started on nicotine patch and discharged on the same.   DISCHARGE VITAL SIGNS:  Temperature 98.9, pulse 71, respiration 20,  blood pressure 132/81, and oxygen saturation 98 on 3 L.   CONDITION ON DISCHARGE:  The patient denies any chest pain.  Denies any  palpitation.  Denies any shortness of breath and is ambulating without  any symptoms.  His oxygen saturation prior to ambulation was 99% and on  ambulation, he sated 95-97% on room air.      Jason Coop, MD  Electronically Signed      Madaline Guthrie, M.D.  Electronically Signed    YP/MEDQ  D:  12/02/2008  T:  12/02/2008  Job:  [id number]

## 2010-10-02 NOTE — Discharge Summary (Signed)
NAME:  [name], [name] NO.:  [id number]   MEDICAL RECORD NO.:  [id number]          PATIENT TYPE:  INP   LOCATION:  6533                         FACILITY:  [facility]   PHYSICIAN:  Aris Lot, MD   DATE OF BIRTH:  1946/04/05   DATE OF ADMISSION:  11/16/2008  DATE OF DISCHARGE:  11/17/2008                               DISCHARGE SUMMARY   DISCHARGE DIAGNOSES:  1. Nausea, vomiting, and diarrhea.  2. Acute renal failure.  3. Atrial fibrillation.  4. Polycythemia.  5. Pulmonary nodule.  6. COPD   DISCHARGE MEDICATIONS:  1. Warfarin 5 mg tablet.  The patient will take 1 tablet by mouth      every Monday, Wednesday, and Friday.  The patient will take one and      one-half tablets by mouth every Tuesday, Thursday, Saturday, and      Sunday.  2. Promethazine 12.5 mg 1 pill by mouth 3 times a day as needed for      nausea and vomiting.  3. Prednisone 10 mg tablet 4 tablets by mouth daily for total of 4      days.  4. Prevacid 30 mg take 1 tablet by mouth daily.  5. Aspirin 81 mg take 1 tablet daily by mouth.  6. Advair 250/50 mcg dose inhaler 1 puff 2 times a day.  7. Albuterol inhaler take 1-2 puffs once every 4 hours as needed for      shortness of breath.  8. Cialis 10 mg tablets take 1 tablet by mouth at least 1 hour before      sexual intercourse.  9. Metoprolol 50 mg tablet take 1 tablet by mouth 2 times a day.  10.MS Contin 15 mg extended release 12 hour tablets take 2 tablets by      mouth twice daily.  11.Amiodarone 200 mg tablets take 1 tablet by mouth once a day.   DISPOSITION AND FOLLOWUP:  The patient was discharged home in stable  condition. The patient is to follow up on Tuesday, November 22, 2008, for  renal function panel to assess his creatinine and a CBC, to assess his  hemoglobin concentration.  The creatinine will be checked due to a  diagnosis of acute renal failure.  The hemoglobin will be checked due to  a diagnosis of polycythemia.  The  patient will also follow up in  Coumadin Clinic with Dr. Alexandria Lodge on November 28, 2008, at 11:30 a.m. to assess  his warfarin regimen and INR.  The patient will also followup with Dr.  Aleene Davidson on December 07, 2008, at 1:30 p.m. to assess the patient after his  hospital discharge as well as set up an outpatient colonoscopy for his  recurrent GI symptoms as well as to set up an outpatient contrast CT for  finding of a right lung nodule.   PROCEDURE PERFORMED:  None.   CONSULTATIONS:  None.   BRIEF ADMITTING HISTORY AND PHYSICAL:  Mr. Reede is a 65 year old male  with a past medical history of atrial fibrillation with rapid  ventricular rate, status post multiple failed cardioversions, as well  as  a history of C. 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  The patient also  stated that he developed nausea and vomiting with poor appetite, poor  oral intake of food and liquids.  The patient also stated that he had  some increased dyspnea on exertion compared to baseline over the 1-2  days prior to admission.  The patient's presenting vitals were a  temperature 97.1 degrees Fahrenheit, pulse rate ranging from 100 to 140,  blood pressure 102/80, respirations 18, and oxygen saturation 97% on  room air.  The patient's presenting labs were white blood count 9.3, red  blood cell 6.28, hemoglobin 18.5, hematocrit 56.3, MCV 89.7, MCHC 32.9,  RDW 17.6, platelet  count 136, neutrophil percent 58, lymphocyte percent  32, monocyte percent 9, eosinophil percent 1, basophil percent 1,  absolute neutrophil count 5.4, absolute lymphocyte count 3.0, absolute  monocyte count 0.8, absolute eosinophil count 0.1, absolute basophil  count 0.1, and PT/INR was 2.4.  The patient's ionized calcium was 1.11.  The patient's sodium was 138, potassium 4.7, chloride 113, glucose 97,  BUN 76, creatinine 3.0, CK-MB was less than 1.0, troponin I was less  than 0.05, and myoglobin is 220.   HOSPITAL COURSE:  1. Nausea, vomiting, and diarrhea.  Upon admission to the hospital,      the patient no longer had nausea, vomiting, or diarrhea.  However,      he was found to be very dehydrated and was rehydrated with IV      normal saline.  The patient has had a recurrent course of nausea,      vomiting, and diarrhea over the last several months possibly      thought to be due to C. diff colitis versus viral gastroenteritis      versus inflammatory bowel disease.  The patient will be following      up the [facility] for a possible outpatient      colonoscopy to further evaluate this patient's GI symptoms.  2. Acute renal failure.  Upon admission to the hospital, the patient's      creatinine was found to be 3.0.  This was presumed to be prerenal      acute renal failure due to dehydration from vomiting and diarrhea      as well as decreased oral intake of fluids over the several days      leading up to the patient's admission to the hospital.  The patient      was rehydrated with IV normal saline and his creatinine trended      down from 3.0 to 2.58 to 2.07 to a final level of 1.81 prior to      discharge, as the patient's creatinine is responding appropriately      to IV hydration and the patient was no longer experiencing nausea,      vomiting, or diarrhea and could maintain adequate oral hydration.  It was felt appropriate to discharge the patient with  instructions      to follow up in clinic to have his creatinine rechecked 5 days      after discharge.  3. Atrial fibrillation.  Upon admission to the hospital, the patient      was found to have atrial fibrillation with rapid ventricular rate.      He was rehydrated with normal saline.  The patient was also placed      on metoprolol as well as IV amiodarone.  The patient remained rate      and rhythm controlled during his hospitalization and was      transitioned back to his home dose of amiodarone 200 mg PO daily      prior to discharge.  4. Polycythemia.  Upon admission to the hospital, the patient was      found to have hemoglobin of 18.5 and hematocrit of 56.3.  This was      presumed to be due to dehydration.  At discharge, the patient's      hematocrit had decreased to 44.4 and hemoglobin had reduced to      15.1, this will be rechecked at the [facility] on      November 22, 2008.  5. Pulmonary nodule.  During this hospitalization, the patient      received a chest x-ray upon which a 14-mm nodule was noted in the      right lung base.  This nodule was thought to be more prominent than      on previous studies.  The patient will have this problem addressed      at his [facility] appointment on December 07, 2008. A      chest CT was not performed during this hospitalization because a      contrast CT study could worsen his improving kidney function.  6. COPD: The patient was found to have wheezing during the last day of      hospitalization. He was given one day of prednisone therapy. He was      discharged with a prescription for 40MG  prednisone daily for 4      days.   DISCHARGE LABS AND VITALS:  The patient's vital signs at discharge were  temperature 98.1, pulse 102, respirations 18, systolic blood pressure  123, diastolic blood pressure 71, and oxygen saturation 95% on room air.  The patient's labs at discharge were creatinine kinase total 39,  CK-MB  1.1, and troponin I 0.02.  Sodium 137,  potassium 4.4, chloride 113, CO2 19, glucose 96, BUN 45, creatinine  1.81, calcium 8.4, and magnesium 2.3.  White blood cell count 9.2, RBC  4.95, hemoglobin 15.1, hematocrit 44.4, MCV 89.7, MCHC 34.1, RDW 17.1,  and platelet count 117.  The patient's PT/INR at the time of discharge  was 2.6.      Aris Lot, MD  Electronically Signed     WW/MEDQ  D:  11/17/2008  T:  11/18/2008  Job:  [id number]   cc:   Jason Coop, MD

## 2010-10-05 NOTE — Consult Note (Signed)
NAME:  [name], [name]                ACCOUNT NO.:  [id number]   MEDICAL RECORD NO.:  [id number]          PATIENT TYPE:  INP   LOCATION:  2919                         FACILITY:  [facility]   PHYSICIAN:  [name], M.D.  DATE OF BIRTH:  06-Jan-1946   DATE OF CONSULTATION:  10/23/2005  DATE OF DISCHARGE:                                   CONSULTATION   IMPRESSION:  1.  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  2.  History of hypertension.  3.  History of seizure disorder.  4.  History of deep venous thrombosis.  5.  Tachy-brady syndrome with pacemaker placement.  6.  Depression.   RECOMMENDATIONS:  1.  This patient needs beta blocker, Lanoxin or Cardizem for heart rate      control.  2.  If above medications do not reduce the heart rate, will consider      amiodarone and discontinue Cardizem.  3.  He may undergo EP evaluation for ablation.  4.  Check lithium level to adjust the dose.  5.  Recent 2D echocardiogram in January 2007 showed ejection fraction of 60      to 70% with a moderate mitral regurgitation and aortic insufficiency and      mild dilatation of left atrium.   HISTORY:  This 65 year old male complained of palpitations and shortness of  breath and chest pain for one to two days.  The patient had similar  complaints in the past.   PAST MEDICAL HISTORY:  Negative for diabetes, positive for hypertension,  positive for smoking, negative for current alcohol use.  He has old history  of alcohol abuse.  No history of elevated cholesterol level, no history of  myocardial infarction, and no family history of premature coronary artery  disease.   PAST SURGICAL HISTORY:  Right wrist cyst removal.  Right lung chest mass  removal.  Right hip surgery for avascular necrosis.  Permanent pacemaker  placement in October of 2004.   CURRENT MEDICATIONS:  1.  Aspirin 81 mg daily.  2.  Atenolol 100 mg 1 daily.  3.  Lisinopril 40 mg 1 daily.  4.  Lanoxin 0.125 mg 1 daily.  5.  Relafen  500 mg 1 or 2 daily.  6.  Diltiazem 180 mg daily.  7.  Lithium 1000 mg at bedtime.  8.  Prevacid 20 mg daily.  9.  Coumadin 5 mg daily.   ALLERGIES:  PENICILLIN giving rash.   PERSONAL HISTORY:  The patient is separated for 30 years.   FAMILY HISTORY:  Mother died of asthma in her 37s.  Mother also had  myocardial infarction.  Father died of old age at 21 and also had a history  of alcohol use.  The patient had five brothers, four died, one living, had  six sisters, two died, four living.   REVIEW OF SYSTEMS:  The patient denies recent weight gain, weight loss, no  history of eye surgery, hearing loss, hemoptysis, asthma, pneumonia, nausea,  vomiting, diarrhea, bleeding from the bowel, hepatitis, blood transfusions,  kidney stones, strokes, seizures or psychiatric admission.  He admits  to  vision changes wearing glasses, full upper and lower dentures, chronic  obstructive lung disease, heart palpitations, chest pain, edema,  claudication and constipation.   PHYSICAL EXAMINATION:  VITAL SIGNS:  Pulse 150, respiration 21, blood  pressure 117/89 after fluid bolus.  Height 6 feet 3 inches.  Weight  approximately 200 pounds.  GENERAL:  The patient alert and oriented x3, well-developed, averagely  nourished black male.  HEENT:  The patient is normocephalic and atraumatic with brown eyes.  Wears  glasses.  Edentulous currently.  Mucous membrane pink and moist.  NECK:  No jugular venous distention, no carotid bruits.  Has full range of  motion of the neck.  LUNGS:  Clear bilaterally.  HEART:  Normal S1, S2 but tachycardic.  ABDOMEN:  Soft, nontender.  EXTREMITIES:  Trace to 1+ edema.  No cyanosis, positive clubbing.  CNS:  Cranial nerves grossly intact.  The patient has bilateral equal grips.   LABORATORY DATA:  Pending.   EKG:  Atrial flutter with 2:1 AV block.   Thank you for consultation.      [name], M.D.  Electronically Signed     ASK/MEDQ  D:  10/23/2005  T:   10/24/2005  Job:  [id number]

## 2010-10-05 NOTE — Discharge Summary (Signed)
[name], [name] NO.:  [id number]   MEDICAL RECORD NO.:  [id number]          PATIENT TYPE:  INP   LOCATION:  2919                         FACILITY:  [facility]   PHYSICIAN:  Duncan Dull, M.D.     DATE OF BIRTH:  07-Aug-1945   DATE OF ADMISSION:  10/23/2005  DATE OF DISCHARGE:  10/26/2005                                 DISCHARGE SUMMARY   DISCHARGE DIAGNOSES:  1.  Atrial flutter with rapid ventricular response.  2.  Chronic headaches.  3.  History of hypertension.  4.  Depression.  5.  History of deep vein thrombosis.  6.  Tachy-brady syndrome, status post pacemaker placement.  7.  Tobacco abuse.  8.  Status post total hip replacement, left hip, for degenerative joint      disease.   DISCHARGE MEDICATIONS:  1.  Digoxin 0.25 mg p.o. daily.  2.  Cardizem ER 240 mg p.o. daily.  3.  Depakote 250 mg p.o. daily.  4.  Metoprolol 100 mg p.o. b.i.d.  5.  Relafen 500 mg p.o. b.i.d. p.r.n.  6.  Coumadin 6 mg p.o. q.h.s.   CONDITION ON DISCHARGE:  IMPROVED.   DISPOSITION:  At the time of discharge, it was not clear if he would benefit from  electrophysiology studies due to his recurrent previous hospitalizations for  the admitting diagnosis.  Due to the patient's insistence on leaving, he was  discharged with that decision deferred to his primary care physician, Dr.  Coralie Carpen.  He was to follow up with Dr. Gwyneth Revels on November 01, 2005.   BRIEF HISTORY AND PHYSICAL/HISTORY OF PRESENT ILLNESS:  [name] is a 65-  year-old African-American male with a history of tachy-brady syndrome status  post pacer with a history of atrial fibrillation/flutter on Coumadin,  followed by Dr. Algie Coffer, who presented to the clinic for a regularly  scheduled appointment with his primary care physician, Dr. Sherlon Handing.  He  was found to have a heart rate in the 150s, and an electrocardiogram was  subsequently obtained, which showed atrial flutter, and was directly  admitted from  the outpatient clinic.  The patient reported Lside substernal  chest pain, stabbing, lasting 10-15 seconds, nonradiating but accompanied by  shortness of breath.  He denied diaphoresis or palpitations.  He admitted to  having substernal chest pain 2-3 times month that lasted 10-15 minutes.  It  occurred approximately every 2 days for 7-8 hours.  He denies any history of  MI, known coronary artery disease, type 2 diabetes, hyperlipidemia, or  family history of coronary artery disease.  He reports recurrent dizziness,  palpitations, orthopnea, PND, edema, shortness of breath and dyspnea on  exertion.   PHYSICAL EXAMINATION:  VITAL SIGNS:  Blood pressure 112/82, heart rate 153,  respirations 16, oxygen saturation 99% on room air.  Weight 205.  GENERAL:  Alert and oriented, in no acute distress.  HEENT:  Extraocular movements intact.  Anicteric.  Oropharynx clear,  edentulous.  NECK:  Supple.  No lymphadenopathy.  PULMONARY:  Small crackles in the left base; otherwise clear.  CARDIOVASCULAR:  Tachycardic in  the 150s.  No murmurs appreciated.  ABDOMEN:  Obese, nontender.  Active bowel sounds.  EXTREMITIES:  Trace bilateral lower extremity edema.  SKIN:  Papular rash on the anterior chest with discrete bumps that are  blanchable.  MUSCULOSKELETAL:  Moves all extremities well.  NEUROLOGIC:  Grossly intact.  PSYCHIATRIC:  Appropriate.   ADMITTING LABORATORY DATA:  White blood cell count of 16.8, hemoglobin 16.3,  hematocrit 48.6, platelets 160, 77% neutrophils, 60% lymphs.  PT 23.7, INR  2.1, PTT 33.  Sodium 136, potassium 4.9, chloride 102, CO2 of 30, glucose  79, BUN 34, creatinine 1.4.  Total bilirubin 1.0, alkaline phosphatase 36,  AST 18, ALT 31, total protein 5.8, albumin 3.0, calcium 8.8.  Free T4 of  1.38, TSH 1.7, CK 73, MB 6.3, troponin 0.13.   HOSPITAL COURSE:  1.  Atrial flutter with rapid ventricular response.  [name] was itreated      with cardizem drip and transitioned  to oral medications with good      control.  Due to recent previous hospitalizations for the same      complaint, the question was raised whether he would benefit from      electrophysiology studies and ablation.  The patient was insistent on      leaving, however, and this decision was therefore deferred to outpatient      evaluation.  Anticoagulation was continued with Coumadin.  The cause of      his arrthymia was unclear but considered to be secondary to use of      lithium for refractory depression.   1.  Hypertension.  The patient's hypertension was managed during this      hospital stay with Diltiazem, lisinopril and metoprolol.  Lisinopril was      discontinued during the hospital stay to allow for increase in      metoprolol for management of his rapid ventricular response, and was      discontinued from his home regimen.  2.  Headaches.  [name] had previously been managed with lithium and      prednisone for chronic cluster headaches.  Neurology recommended      discontinuing both and using depakoate.  Depakote was, therefore,      started on discharge, and its continuation will be determined by the      patient's primary care physician.  3.  Depression.  [name] had been managed for his depression with      lithium.  Due to its possible connection to his arrhythmia, it was      discontinued.  The patient had elected to leave prior to use obtaining a      psychiatry consult.  He is, therefore, to follow up with mental health      in order to determine an optimal mental health medical treatment.   DISCHARGE LABORATORIES:  White blood cells 11.4, hemoglobin 16.5, hematocrit  48.7, platelets 157, PT 23.5, INR 2.1.  He is to continue his Coumadin as  managed by Dr. Algie Coffer with cardiology.      Sharin Mons, M.D.      Duncan Dull, M.D.  Electronically Signed    WC/MEDQ  D:  11/01/2005  T:  11/01/2005  Job:  [id number]   cc:   Coralie Carpen, M.D. Fax:  [phone]   [name], M.D.  Fax: [phone]   Genene Churn. Love, M.D.  Fax: [phone]

## 2010-10-05 NOTE — Consult Note (Signed)
NAME:  [name], [name]                          ACCOUNT NO.:  [id number]   MEDICAL RECORD NO.:  [id number]                   PATIENT TYPE:  INP   LOCATION:  3734                                 FACILITY:  [facility]   PHYSICIAN:  Gustavus Messing. Orlin Hilding, M.D.          DATE OF BIRTH:  08/26/1945   DATE OF CONSULTATION:  10/12/2003  DATE OF DISCHARGE:  10/13/2003                                   CONSULTATION   NEUROLOGICAL CONSULTATION   REASON FOR CONSULTATION:  Headache.   HISTORY OF PRESENT ILLNESS:  [name] is a 65 year old, right-handed black  male with a complicated medical history who is on chronic Coumadin for  portal and superior mesenteric vein thromboses who was admitted with  persisting right-sided headache.  The patient reports about a two to three  week history of right hemicranial pressure-like headache initially lasting  only about 30 minutes several times a day but becoming continuous over the  last week.  It has, on occasion, been associated with nausea and vomiting,  blurred vision and some droopiness of the right face and perceived right  sided weakness.  He has no prior history of migraine or cluster headaches.  CT scan on admission without contrast and yesterday with contrast were  unrevealing. He is generally not responsive to narcotics except the headache  is now a 4 on a 0 to 10 scale whereas it was an 8 on admission.  Carotid  Doppler was done and was negative.  His INR is therapeutic at 2.3.   REVIEW OF SYMPTOMS:  As described above.  He also has a chronic sense of  imbalance.   PAST MEDICAL HISTORY:  Significant for portal and superior vein thromboses  on chronic Coumadin, history of alcohol abuse although this is not current,  history of some arrhythmias including a tachybrady syndrome and syncope, now  with permanent pacemaker.  He has avascular necrosis of bilateral hips  status post left total hip replacement, history of gastritis, hypertension,  polyneuropathy, osteoarthritis.   CURRENT MEDICATIONS:  Lanoxin, Effexor, Protonix, Coumadin, Nicotine patch,  Ambien, morphine and prednisone.   ALLERGIES:  PENICILLIN.   SOCIAL HISTORY:  He is widowed.  Previously was a Media planner.  He has nine  children.  He is on disability.  He does smoke marijuana and tobacco.   FAMILY HISTORY:  Positive for migraine in the brother.   PHYSICAL EXAMINATION:  VITAL SIGNS:  Temperature 99.2, pulse 75,  respirations 18, blood pressure 154/92, 100% saturation on room air.  HEENT:  Head normocephalic, atraumatic.  NECK:  Supple without bruits.  HEART:  Regular rate and rhythm.  NEUROLOGICAL:  Examination of mental status patient is awake, alert, fully  oriented with normal imaging/cognition.  Cranial nerves:  Pupils are equal  and reactive, visual fields are full to confrontation.  His disc margins are  sharp.  Extraocular movements intact without nystagmus or ophthalmoparesis.  He has an extremely  subtle asymmetry of his palpebral fissures with very  subtle ptosis on the right.  It does not cover the iris or pupil completely.  Facial sensation is normal.  Facial motor activity is intact.  Hearing is  intact. Palate is symmetrical, tongue is midline.  On motor examination he  has normal station and gait, normal bulk, tone and strength throughout with  5/5 strength in all four extremities.  No drift or satelliting.  Normal fine  movements, no fasciculations, atrophy or tremors.  I cannot really  appreciate any weakness on examination although he was reported to have had  some when he came in.  Reflexes are 2+ and symmetrical in the upper and  lower extremities with the exception of absent ankle jerks bilaterally,  downgoing toes to plantar stimulation.  No clear focal asymmetry.  Coordination- finger to nose, rapid alternating movements, heel to shin, and  tandem gait are all normal.  Sensory examination he reports subjectively  slight decrease  without anesthesia to multiple modalities on the right.   CLINICAL DATA:  CT scan of brain with and without contrast negative.   IMPRESSION:  Right sided headache with subtle neurological findings.  Differential diagnoses include cluster headache and sympathetic migraine  although many features are atypical for that on his examination.  I still  need to rule out aneurysm.   RECOMMENDATIONS:  1. Would get a CT scan angiogram of the head and neck to rule out aneurysm     since we cannot get an MRI.  If suspicious may need a catheter angiogram.  2. Trial of Depakote with intravenous load of 1 gram and then 250 mg p.o.     t.i.d.  Would consider Triptan DHE but he does have some elevated blood     pressures.  3. Consider oxygen at adequate dose 7 liters for 10 minutes.  4. Consider beta blocker __________, Indocin, intravenous Toradol.                                               Catherine A. Orlin Hilding, M.D.    CAW/MEDQ  D:  10/12/2003  T:  10/13/2003  Job:  [id number]

## 2010-10-05 NOTE — Discharge Summary (Signed)
[name], [name]                ACCOUNT NO.:  [id number]   MEDICAL RECORD NO.:  [id number]          PATIENT TYPE:  INP   LOCATION:  2007                         FACILITY:  [facility]   PHYSICIAN:  [name], M.D.  DATE OF BIRTH:  1946/03/30   DATE OF ADMISSION:  06/18/2007  DATE OF DISCHARGE:  06/19/2007                               DISCHARGE SUMMARY   FINAL DIAGNOSES:  1. Atrial fibrillation with rapid ventricular response.  2. Chronic obstructive pulmonary disease.  3. Dilated cardiomyopathy.   DISCHARGE MEDICATIONS:  1. Metoprolol 100 mg one twice daily.  2. Lasix 40 mg on Monday, Wednesday and Friday.  3. KCL 10 mEq one on Monday, Wednesday and Friday.  4. Diltiazem 90 mg two twice daily.  5. Megace 200 mg twice daily.  6. Thiamine 100 mg one daily.  7. Folic acid 1 mg daily.  8. Lanoxin 0.125 mg one daily.  9. Albuterol metered dose inhaler two puffs four times daily as      needed.  10.Ambien 5 mg at bed time.  11.Coumadin 5 mg daily.  12.Advair 250/50 one puff twice daily.  13.MS-Contin 60 mg one twice daily.  14.Dulcolax 15 mg as needed for constipation.   CONDITION ON DISCHARGE:  Stable.   FOLLOWUP:  By Dr. Orpah Cobb in four days.   DISCHARGE ACTIVITY:  The patient will increase activity slowly.   DISCHARGE DIET:  Low sodium heart healthy diet.   SPECIAL INSTRUCTIONS:  The patient is to stop any activity that causes  chest pain, shortness of breath, dizziness, sweating or excessive  weakness.   LABORATORY DATA:  Near normal Hemoglobin and hematocrit. WBC count  borderline at 11,200.  Platelet count normal. INR 2.9 to 2.5.  Electrolytes near normal. Glucose 125. Beta natruretic peptide was  slightly high at 147. EKG atrial fibrillation with rapid ventricular  response. Subsequent EKG showed a normal sinus rhythm with anterolateral  ischemia.   HOSPITAL COURSE:  The patient was admitted to the telemetry unit. He was  given IV Cardizem and his oral  Cardizem dose was increased. His heart  rate was controlled and it was found out the patient had monetary  problem and was not taking his  medications regularly. The patient was  advised to take his medications regularly and he was discharged home in  satisfactory condition with follow up by me in four days.      [name], M.D.  Electronically Signed     ASK/MEDQ  D:  07/08/2007  T:  07/09/2007  Job:  [id number]

## 2010-10-05 NOTE — Op Note (Signed)
NAME:  [name], [name] NO.:  [id number]   MEDICAL RECORD NO.:  [id number]                   PATIENT TYPE:  OUT   LOCATION:  MRI                                  FACILITY:  [facility]   PHYSICIAN:  Ronnell Guadalajara, M.D.                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   OPERATION PERFORMED:  Right bipolar hip replacement.   SURGEON:  Ronnell Guadalajara, M.D.   ASSISTANT:  Georges Lynch. Darrelyn Hillock, M.D.   ANESTHESIA:  General.   DESCRIPTION OF PROCEDURE:  After general anesthesia, he was positioned in  the left lateral decubitus and the right hip was prepped and draped  routinely.  An Austin-Moore approach was utilized.  The external rotators  were taken from the femur, separated from the capsule, tagged.  Checked the  sciatic nerve. The capsule was split in line with its incision and the head  was dislocated.  The neck was amputated, the head was removed and it  measures between a 54 and a 56.  The acetabulum was inspected.  There was a  lot of granulation tissue, right angled granulation in the fossa ovalis.  This was bovied and removed.  The rest of the ligament was removed.  The  remainder of the acetabulum looked good and it was felt that a bipolar would  be satisfactory based on his history of alcoholism in the past and already  some noncompliance and in his preop evaluation and preop planning it was  felt that this would be the safest way to handle it.  The femur was then  prepared with rasps and reaming to accept a size 10 prosthesis.  The distal  tip reaming was carried to 14.5 mm.  The  prosthesis was inserted, a trial  reduction was carried out.  0, +5 and +10 neck lengths and trying different  balls, the 56 felt be just an excellent fit, good stability. We went with a  +5 neck length and the bipolar was inserted.  The wound was closed  routinely.  There was minimal blood loss, no drains.  He went to recovery in  good condition.                                               Ronnell Guadalajara, M.D.    PC/MEDQ  D:  09/09/2002  T:  09/10/2002  Job:  [id number]

## 2010-10-05 NOTE — Discharge Summary (Signed)
NAME:  [name], [name]                          ACCOUNT NO.:  [id number]   MEDICAL RECORD NO.:  [id number]                   PATIENT TYPE:  INP   LOCATION:  5008                                 FACILITY:  [facility]   PHYSICIAN:  Ronnell Guadalajara, M.D.                DATE OF BIRTH:  08/26/1945   DATE OF ADMISSION:  DATE OF DISCHARGE:                                 DISCHARGE SUMMARY   ADDITIONAL DIAGNOSES:  1. 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  2. Coronary artery disease.  3. Hypertension.  4. Gastroesophageal reflux disease.   DISCHARGE DIAGNOSES:  1. Aseptic necrosis, both hips, worse on the left than the right, status     post left hip hemiarthroplasty.  2. Coronary artery disease.  3. Hypertension.  4. Gastroesophageal reflux disease.  5. Postoperative hemorrhagic anemia.  6. Depression.  7. Suicidal ideation.   PROCEDURES:  The patient was taken to the operating room on September 09, 2002,  and underwent a left hip bipolar hemiarthroplasty. The surgeon was Dr.  Debria Garret, assistant Dr. Ranee Gosselin. The surgery was done under  general anesthesia.   CONSULTS:  1. Pharmacy with Shaune Leeks for Coumadin management. History of portal vein     thrombosis.  2. Medicine with Dr. Reche Dixon.  3. Psych with Dr. Jeanie Sewer.  4. Physical therapy, occupational therapy, social work and case management.  5. Physical medicine and rehabilitation consult.   HISTORY OF PRESENT ILLNESS:  The patient is a 65 year old black male who has  been having  progressive problems concerning his left hip. He has been  having  increasing pain to his hip now for several years which had been  markedly severe to the point where he now must use a cane for ambulation. He  has a rather problematic medical history in the past. He is seen for various  medical problems in the. He tells me that he is currently being seen by Dr.  Lavera Guise at  [facility]. Also he has been  seen by Dr. Algie Coffer and  will get medical clearance from either Dr. Lavera Guise or Dr. Algie Coffer. Hopefully he  will get medical clearance OK and we can get the patient off his Coumadin  prior to  surgery and go ahead with the surgery. The risks and benefits of  the surgery were discussed with the  patient. The patient wishes to proceed  with bipolar hip hemiarthroplasty.   LABORATORY DATA:  CBC on admission showed a hemoglobin of 16.3, hematocrit  47.7. White blood cell count 11.1, red blood cell count 5.05. Serial H&Hs  were followed throughout the hospital stay. Hemoglobin and hematocrit did  decline to 11.0 and 33.0 respectively on September 17, 2002, however, were  stable at the time of discharge. White blood cell count at the time of  discharge was 8.4. Differential on admission showed neutrophils  slightly  high at 78. Followup differential on September 11, 2002, showed neutrophils  still slightly high at 78,  lymphocytes low at 98 and monocytes high at 13.  Coagulation studies on admission showed a PT of 34.2, an INR of 4.5 and a  PTT of 80. Prior to surgery after being given multiple doses of vitamin K by  pharmacist Shaune Leeks, PT and INR and PTT were 15.8, 1.3 and 35 respectively.  Prior to  discharge the patient's PT was 18.6 and INR was 1.7 on September 17, 2002. Routine chemistry on admission showed glucose high at 127. Serial  chemistries were followed throughout his hospital stay. The potassium  declined to 3.3 on September 13, 2002, however, it was back up to 3.4 on September 16, 2002. CO2 became high on September 15, 2002, at 43, however, it declined  back to normal range on September 16, 2002. The patient had a high glucose  reading on September 16, 2002, of 127. A urinalysis on admission showed color  orange, appearance cloudy, trace hemoglobin, moderate amount of bilirubin,  15 mg/dL of ketones, 30 mg/dL of protein, a small amount of leukocyte  esterase and few bacteria. Followup urinalysis on September 11, 2002, showed  color amber, appearance cloudy, small amount of hemoglobin, small amount of  bilirubin, 4 mg/dL of urobilinogen, leukocyte esterase was negative and  bacteria returned to regular. The patient's blood type was AB positive with  antibody screen negative.   A preoperative EKG on September 06, 2002, revealed normal sinus rhythm with  occasional premature ventricular complexes, voltage criteria and left  ventricular hypertrophy. Nonspecific T-wave abnormality. Radiographs on  August 22, 2002, of the chest revealed chronic changes of scarring and blebs  on the left, right middle lobe nodules not visible onto the chest. 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 Portable chest x-ray  on September 10, 2002, revealed stable pleural and parenchymal disease on the  left lung with no active disease. Another repeat chest on September 12, 2002,  revealed stable chest x-ray.   HOSPITAL COURSE:  The patient was admitted to [facility] and taken  to the operating room. He underwent the above surgical procedure without  complications. The patient tolerated the procedure well and was allowed to  return to the recovery room and the orthopedic floor to continue  postoperative care.   The patient was seen  by orthopedics on postoperative day #1, September 10, 2002, with a T-max of 101. The patient was to work with physical therapy and  occupational therapy and dressing changes on the following day.   On September 11, 2002, postoperative day #2, the patient was doing well. His  hip pain was improving. His T-max was 100.9. He continued with physical  therapy and occupational therapy. His incision was clean, dry and intact.  The bedside incentive spirometer was encouraged.   On September 12, 2002, the patient was seen  by orthopedics. The patient was  up in a chair and doing well. His Foley catheter was discontinued at this  time.  On September 13, 2002, postoperative day #4 the  patient was  resting  comfortably and encouraged to work with physical therapy as he refused the  previous day. His T-max was 101.4. His incision was clean, dry and intact.   On September 14, 2002, postoperative day #5, he was seen  by orthopedics. His T-  max was 102.3. The patient was  doing well with physical therapy and was  walking 180 feet with moderate supervision.  A rehabilitation consult was  obtained.   On September 15, 2002, the patient was  resting comfortably. Still spiking  fevers. His T-max at this time was 100.6. Thus he was held for his  temperature to decrease for 24 hours.   On September 16, 2002, postoperative day #7, the patient was complaining of  calluses on bilateral feet. It was explained to the patient that it would be  taken care of in the office by Dr. Montez Morita. The patient had spoken with Dr.  Montez Morita about this. The patient worked well with physical therapy. His T-max  was 103 to 100.8.   On September 17, 2002, postoperative day #8, the patient was  resting  comfortably. He was still complaining of calluses on the bottom of his feet.  His T-max was 107. He was afebrile at the time of being seen. The patient is  being discharged to the [facility] per Dr. Jeanie Sewer.   Medicine followed the patient throughout his hospital stay monitoring his  fevers and placing him on Cipro for empiric treatment of possible UTI on  September 15, 2002. It was later revealed that the patient was  depressed and  was having  some suicidal ideations and Dr. Jeanie Sewer was consulted on September 16, 2002, when he determined that the patient would benefit from a stay at  the [facility].   Medicine throughout the hospital stay monitored the patient's hypertension  to a stable point along with his COPD, CHF. At the time of discharge  medicine had determined that the patient was  stable for transfer.   DISPOSITION:  The patient is discharged to [facility].   DISCHARGE MEDICATIONS:  1. Lisinopril 20 mg 1 p.o. every day.  2. Lanoxin 0.125 mg 1 p.o. every day.  3. Metoprolol 12.5 mg 1 p.o. b.i.d.  4. Ventolin inhaler 2.5 mg inhaled q.i.d.  5. Nicotine patch 21 mg x 24 hours, transdermal every day.  6. Atrovent 0.5 mg inhaled q.i.d.  7. Colace 100 mg 1 p.o. b.i.d.  8. Ambien 5 mg 1 p.o. q.h.s. p.r.n.  9. Cipro 250 mg 1 p.o. b.i.d.  10.      Prevacid 30 mg 1 p.o. every day.  11.      Anusol 1 application p.r. t.i.d.  12.      Coumadin per  pharmacy protocol.  13.      Percocet 1 to  2 tablets q.4-6h. p.r.n. pain.  14.      Restoril 30 mg p.o. q.h.s. p.r.n.  15.      Tylenol 325 to 650 mg p.o. q.4h. p.r.n. temperature greater than     101.5.  16.      Enema of choice 1 unit p.r. p.r.n.  17.      Laxative  of choice 1 unit p.o. p.r.n.   DISCHARGE INSTRUCTIONS:  Diet as tolerated. Activity, total hip precautions. The patient is weight bearing as tolerated. Genevieve Norlander will be for home care  when discharged from [facility]. Wound care, the patient is to  have daily dressing changes to his wound. If he is still in the hospital 2  weeks from the day of surgery, staples are to be removed at 2 weeks.   FOLLOW UP:  The patient is to follow up with Dr. Montez Morita 2 weeks from the day  of surgery or as soon as discharged from [facility].   CONDITION ON DISCHARGE:  Stable.      Clarene Reamer, P.A.-C.                   Ronnell Guadalajara, M.D.    SW/MEDQ  D:  09/17/2002  T:  09/17/2002  Job:  [id number]

## 2010-10-05 NOTE — Discharge Summary (Signed)
NAME:  [name], [name]                          ACCOUNT NO.:  [id number]   MEDICAL RECORD NO.:  [id number]                   PATIENT TYPE:  INP   LOCATION:  3734                                 FACILITY:  [facility]   PHYSICIAN:  Fransisco Hertz, M.D.               DATE OF BIRTH:  08/26/1945   DATE OF ADMISSION:  10/10/2003  DATE OF DISCHARGE:  10/13/2003                                 DISCHARGE SUMMARY   DISCHARGE DIAGNOSES:  1. 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  2. 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  3. History of alcohol abuse, currently abstinent.  4. History of gastritis.  5. Hypertension.  6. History of bilateral avascular necrosis of hips, total hip replacement on     left in 4/04.  7. History of supraventricular tachycardia.  8. 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  9. Tobacco abuse.  10.      Osteoarthritis.  11.      Erectile dysfunction.  12.      Pleural plaques on chest CT.  13.      Polyneuropathy.  14.      Depression.   DISCHARGE MEDICATIONS:  1. Digoxin 0.25 mg once daily.  2. Effexor 75 mg p.o. once daily.  3. Coumadin Monday, Tuesday, Thursday, Friday, and Saturday 10 mg p.o.,     Wednesday 7.5 mg p.o.  4. Prednisone 20 mg x2 days, then 10 mg x2 days, then stop.  5. Depakote 250 mg p.o. t.i.d.  6. Imitrex nasal spray 5 mg spray to each nostril for headache, may repeat     in 2 hours if headache recurs. Do not use more than eight sprays in 24     hours.  7. Omeprazole 20 mg p.o. once daily.  8. Lisinopril 20 mg p.o. once daily.  9. Oxycodone 5 mg p.o. q.6h. p.r.n.   FOLLOW UP:  The patient will be seen in the [facility] by  Dr. __________ on June 1 at 9:30 a.m. for further evaluation of headache.   CONSULTATIONS:  Dr. Dennie Fetters of neurology on Oct 12, 2003.   PROCEDURES:  1. Oct 10, 2003, CT of the head without contrast. Impression:  Normal  study.  2. Oct 11, 2003, chest x-ray. Impression:  Extensive chronic left pleural     and parenchymal scarring, no change, right nipple shadow simulates     nodule, no active disease.  3. Oct 11, 2003, CT head with contrast:  No acute disease, no significant     interval change.  4. Oct 12, 2003, CT angiogram of head and neck. Impression:  No evidence for     extracranial vascular disease involving the carotid or vertebral     circulation. Negative angiography of head.  5. Oct 11, 2003, bilateral carotid ultrasound. Impression:  Bilateral known  ICA stenosis, vertebral artery flow antegrade.   HISTORY OF PRESENT ILLNESS:  The patient is a 65 year old African-American  male with past medical history as noted above including portal vein  thrombosis, hypertension, COPD who is on chronic Coumadin therapy who  presented to the acute care clinic with complaints of severe right parietal  headache with pressure quality over the past 2 weeks. The patient reports  that over the weekend, it has increased in severity. He was given a  prescription for Tylenol No. 3 and Xanax and seen in the emergency  department on 09/27/03 with similar complaints. The patient reports the  Tylenol did not help, the Xanax puts him to sleep but when he awakens, the  headache is usually present. The patient reports he has become irritable  because of the pain, also noted some laziness of his right eye and right  upper extremity grip. Also, states that his wife noted that his right leg  was dragging a little bit. Last INR was therapeutic; however, patient was  off Coumadin from Sep 30, 2003 to Oct 05, 2003 in preparation for a dental  procedure. However, the patient reports symptoms started prior to holding  his Coumadin.   PHYSICAL EXAMINATION:  VITAL SIGNS:  Temperature 97.5, blood pressure  121/88, pulse 75, respirations 22, 97% on room air.  GENERAL:  In no apparent distress, pleasant, and conversant.  HEENT:   Eyes:  Pupils are equal, round, and reactive to light, extraocular  movements intact. Left lid droop. No lesions on head. No tenderness to  palpation of right side of head. ENT:  Oropharynx clear.  NECK:  Supple, no lymphadenopathy, no carotid bruits.  LUNGS:  Clear to auscultation bilaterally.  HEART:  Regular rate and rhythm with gallop.  ABDOMEN:  Soft, nontender, nondistended, positive bowel sounds.  EXTREMITIES:  No cyanosis, clubbing, or edema, palpable pulses, symmetric  calf.  SKIN:  Warm and dry.  NEUROLOGIC:  Cranial nerves III-XII intact. DTRs 2+ throughout. Strength 4/5  for right grip and 4/5 for finger extension, appears to have poor effort,  all other strength 5/5 throughout. Cerebellar intact. Negative Romberg test.  No pronator drift. Alert and oriented x3.  PSYCH:  Appropriate.   ADMISSION LABORATORY DATA:  WBC 7.7, hemoglobin 13.6, platelets 161. Sodium  142, potassium 3.4, chloride 107, bicarb 28, glucose 115, BUN 13, creatinine  1.0. Total bilirubin 0.8, alkaline phosphatase 50, AST 23, ALT 17. Total  protein 6.8, albumin 3.8, calcium 9.4. INR 2.3.   HOSPITAL COURSE:  Problem 1. Right-sided headache with subtle neurologic  findings. With patient presentation, he needed to be ruled out for acute  stroke. The patient was therapeutic on his Coumadin at admission. The  patient underwent CT of the head with and without contrast which showed no  masses and no significant changes or acute disease. The patient's  neurological findings were somewhat puzzling as he had right hand grip  decreased strength; however, no pronator drift. We were not able to obtain  an MRI/MRA as patient had pacemaker in place. ESR was checked which was  found to be within normal limits. The patient was started on prednisone for  the treatment of possible cluster headaches. A neuro consult was obtained.  CT angiogram of the head and neck were obtained to rule out aneurysm. These were negative.  The patient was placed on Depakote with IV load then 250 mg  p.o. t.i.d. The patient was placed on O2 at 7 L for 10 minutes with  onset of  headache. The patient's pain was managed with IV morphine. The patient also  underwent bilateral carotid ultrasounds which showed no stenosis. As life  threatening illness has been ruled out in this patient, continuation of his  care will be done as an outpatient. The patient will be discharged on  prednisone taper, Depakote p.o. with Imitrex nasal spray and oral pain  control. The patient will follow up in the [facility]  within the week to further assess his headache. Likely diagnosis is cluster  headaches with some atypical findings versus migraines.   Problem 2. History of portal and superior mesenteric vein thrombosis. The  patient was continued on his Coumadin regimen and remained with a  therapeutic INR between 2 and 3.   Problem 3. History of gastritis. The patient was placed on Protonix and his  hemoglobin was stable throughout his hospitalization.   Problem 4. Depression. The patient was continued on his home dose of  Effexor.   Problem 5. Hypertension. The patient's blood pressure medications were held  on admission as it was possible that he had a stroke. The patient will be  continued on his lisinopril as an outpatient.   Problem 6. Tobacco use. The patient was counseled on the ill effects of  smoking. He has liberal plans at this time to quit. The patient was placed  on a nicotine patch while in house.   DISCHARGE LABORATORY DATA:  WBC 10.6, hemoglobin 12.5, platelets 141. Sodium  144, potassium 3.9, chloride 109, bicarb 29, glucose 86, BUN 9, creatinine  1.0. Calcium 9.0. ESR 5. INR 2.6.      Delbert Harness, MD                    Fransisco Hertz, M.D.    Cleotis Lema  D:  10/13/2003  T:  10/15/2003  Job:  [id number]

## 2010-10-05 NOTE — Discharge Summary (Signed)
NAME:  [name], [name]                          ACCOUNT NO.:  [id number]   MEDICAL RECORD NO.:  [id number]                   PATIENT TYPE:  INP   LOCATION:  3727                                 FACILITY:  [facility]   PHYSICIAN:  Zetta Bills, MD                       DATE OF BIRTH:  08/26/1945   DATE OF ADMISSION:  08/18/2003  DATE OF DISCHARGE:                                 DISCHARGE SUMMARY   DISCHARGE DIAGNOSES:  1. Nausea and vomiting secondary to gastritis.  2. Acute renal insufficiency secondary to volume depletion (corrected upon     discharge).  3. 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  4. Hypertension.  5. 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  6. History of avascular necrosis.  7. Depression.  8. Gastritis.  9. Mild chronic obstructive pulmonary disease.  10.      History of alcohol abuse/tobacco abuse/drug abuse.   DISCHARGE MEDICATIONS:  1. Digoxin 0.125 mg p.o. daily.  2. Effexor 75 mg p.o. daily.  3. Prilosec one tablet p.o. daily.  4. Coumadin 10 mg q. Monday, Wednesday, Friday, Saturday and Sunday and 7.5     mg q. Tuesday and Thursday.  5. The patient is advised to restart his hydrochlorothiazide in accordance     to his primary care physician.   DISPOSITION:  1. The patient was discharged to his own home.  2. He is to follow-up with Dr. Lonia Blood at the [facility]     outpatient clinic on August 22, 2003.  The pertinent reason for this follow-     up visit is a regular outpatient clinic visit and this will be combined     with ascertaining that he has had resolution of his gastritis symptoms.     Dr. Lavera Guise will also advise this patient on whether or not to restart his     hydrochlorothiazide based on his blood pressures.  3. The patient also on follow-up appointment needs re-establishment of     clinic visits with the Coumadin clinic at [facility].   CONSULTATIONS:  No major consultations were  sought during this admission.   PROCEDURES:  1. August 19, 2003:  Chest x-ray was done that showed no acute changes.  2. August 19, 2003:  CT scan of the abdomen was also done that showed     cavernous transformation of the portal vein with chronic right sided     hepatic density.  No acute changes in the abdomen were noted.  He had a     normal appendix.  His left hip replacement was noted on CT scan of the     abdomen.   REASON FOR ADMISSION:  Weakness for the past one week and general malaise.   HISTORY OF PRESENT ILLNESS:  This is a 65 year old African-American male  with a past medical history  significant for tachybrady syndrome (status post  pacemaker), portal and superior mesenteric vein thrombosis (on chronic  Coumadin therapy), hypertension, congestive cardiac failure (ejection  fraction of 35%) and gastritis.  He came to the emergency room with a one  week history of general malaise and weakness and a one day history of having  had vomited four episodes of bilious vomit.  Together with this he notes  anorexia.  He had some chest pain on and off, but this was predominantly  pleuritic in nature.   ALLERGIES:  1. PENICILLIN and gets a skin rash in reaction to this.   PAST MEDICAL HISTORY:  In addition to the problems listed above this is  significant for:  1. History of avascular necrosis of the hip that was seen in April 2004.  2. History of mild chronic obstructive pulmonary disease.  3. History of supraventricular tachycardia in April 2002.  4. History of atrial flutter in July 2003.  5. History of right sided pulmonary nodule that was seen on prior     radiological evaluation.  6. History of cardiac catheterization in October 2003 that showed general     hypokinesia and mild mitral regurgitation.   SOCIAL HISTORY:  He is current smoker and smokes one to one and a half packs  per day of cigarettes for the past 18 to 20 years.  He used to drink alcohol  and quit four years  ago.  He is a current marijuana user. He is married and  does not have any health insurance.  For home and social support, he lives  with his wife on and off and has eight children.   FAMILY HISTORY:  Significant for heart disease in the mother and alcoholism  in the father.  He has one daughter who has HIV infection.   REVIEW OF SYMPTOMS:  CARDIOVASCULAR:  He notes chest pain that is on and  off.  PULMONARY:  He has some cough.  GI:  He has nausea and vomiting.  ENDOCRINE:  He has cold intolerance.  HEENT:  He has blurry vision.  PSYCHIATRIC:  He has depression.   PHYSICAL EXAMINATION ON ADMISSION:  VITAL SIGNS:  Pulse is 76, blood  pressure 135/92, temperature 98.0, respirations 18, oxygen saturation 99% on  room air.  GENERAL ASSESSMENT:  He is awake, alert and oriented.  HEENT:  Examination of his eyes:  Pupils equal, round and reactive to light  and accommodation.  LUNGS:  Examination of his respiratory system reveals that both lung fields  are clear to auscultation.  CARDIOVASCULAR:  His pulse is regular rate and rhythm and he has a grade 3/6  systolic ejection murmur.  ABDOMEN:  Soft with some epigastric tenderness and right upper quadrant  pain, but mostly diffuse tenderness.  Bowel sounds are hyperactive.  EXTREMITIES:  There is no cyanosis, clubbing or edema.  NEUROLOGIC:  Noncontributory.  PSYCHIATRIC:  Noncontributory.   LABORATORY DATA:  Sodium 136, potassium 4.4, chloride 98, bicarbonate 23,  BUN 44, creatinine 2.3 and glucose 140.  Hemoglobin 19.4, white cell count  12.1 with absolute neutrophils of 10.4, platelet count 180,000.  Bilirubin  1.3, alkaline phosphatase 86, SGOT 28, SGPT 19, protein 8.5, albumin 4.7,  calcium 10.7.  Prothrombin time was 22.1 seconds with an INR of 2.6.  Digoxin level was 0.7. Lipase level was 51.   HOSPITAL COURSE:  1. NAUSEA/VOMITING.  This was most likely due to gastritis from a viral    etiology. An abdominal CT scan was  done and  did not reveal any organic     etiology and the patient responded extremely well on simple IV fluid     rehydration and antiemetic treatment with Phenergan.  The patient also     did not reveal any feature of pancreatitis based on the laboratory tests     and the CT also did not show any gallbladder etiology.  Cardiac enzymes     were screened as the epigastric gain was also a differential diagnosis     for an acute myocardial infarction and these were negative on three     incidences.  For his gastritis, augmentation of treatment was also done     with Protonix and the patient was discharged on Prilosec which he was     taking as an outpatient.  2. ELEVATED CREATININE LEVELS.  His baseline creatinine for the past year     was noted to be 1.7 and it was 2.3 upon admission.  This was most likely     secondary to volume depletion as it was noted on follow-up creatinine     levels after rehydration treatment that were noted to have normalized     with the level being 1.2 upon discharge.  This will be followed further     as an outpatient to verify that the patient has had full resolution of     his renal function and has not had any exacerbation of his renal     insufficiency that he had during this admission.  3. CHRONIC ANTICOAGULATION TREATMENT. Upon admission it was noted that this     patient was therapeutic in his INR.  His Coumadin regime was continued as     it was as an outpatient and prior to discharge his INR was noted to be at     3.1.  This will be followed up at the [facility] Coumadin clinic and also     will be followed up by his primary care physician.  4. DEPRESSION.  The patient will be continued on Effexor as he was already     taking this prior to admission.  No dosage changes will be made at this     time.  5. HYPERTENSION.  During admission it was noted that this patient had a     blood pressure of 135/92 with repeat measures showing systolic blood     pressures  consistently in the range of 90.  For now, we will hold his     hydrochlorothiazide treatment and this will be restarted by his primary     care physician upon outpatient clinic visit with the aim of restarting     his hydrochlorothiazide.                                                Zetta Bills, MD    JP/MEDQ  D:  08/20/2003  T:  08/20/2003  Job:  [id number]   cc:   Lonia Blood, M.D.  [address] [place], [place] [address]  Fax: [phone]

## 2010-10-05 NOTE — Consult Note (Signed)
NAME:  [name], [name] NO.:  [id number]   MEDICAL RECORD NO.:  [id number]          PATIENT TYPE:  INP   LOCATION:  2919                         FACILITY:  [facility]   PHYSICIAN:  Genene Churn. Love, M.D.    DATE OF BIRTH:  30-May-1945   DATE OF CONSULTATION:  10/25/2005  DATE OF DISCHARGE:  10/26/2005                                   CONSULTATION   This 65 year old right-handed, black, widowed male is seen in the hospital  in the setting of atrial fibrillation/flutter with 2:1 block for evaluation  of headaches.   HISTORY OF PRESENT ILLNESS:  [name] has a long and complicated past  history with a documented portal vein thrombosis and superior mesenteric  vein thrombosis.  He had DVT in his right leg in 1999 and has been on  chronic Coumadin therapy which was restarted October 21, 2005.  He has had a  known history of bipolar disease, treated with lithium therapy, and  avascular necrosis of the left hip status post left hip replacement.  He has  osteoarthritis of the right hip, chronic obstructive pulmonary disease,  history of pulmonary nodule with surgery, tachycardia-bradycardia syndrome  requiring pacemaker placement, and a know history of alcohol use in the  remote past.  He was admitted on October 23, 2005, for evaluation of  tachyarrhythmias with atrial flutter.  I am asked to see him for evaluation  of headaches.  His history on the headaches is very poor.  He states he  began having headaches in about 1994.  He states his headaches have always  been on the left side, but documents in 2005 indicate they were also on just  the right side.  Headaches on the left side are associated with left eye  ptosis, tearing, and nasal stuffiness and last anywhere from 45 minutes to 2  hours.  They have been treated as cluster headaches with Depacon,  prednisone, nasal oxygen, but as best as I can determine, he has never been  on Indocin.  He has been on lithium because of a known  history of bipolar  disease.  He states he has responded to prednisone in the past, though he is  currently not on it.  He does have a history of avascular necrosis of the  left hip requiring surgery.  He indicates that his headaches occur 3 to 4  times per day.  His last headache was the night before last.  They are  relieved with the use of nasal oxygen.  They tend to occur more at night  than during the day and can awaken him.  It is not clear whether or not  alcohol has any effect on the headaches.  In May 2005, he was evaluated with  a CT scan with and without contrast enhancement which was normal, CT  angiogram of the head and neck which was negative, and estimated sed rate  which was normal, and he was treated with Depakote and O2 at 7 liters for 10  minutes.  Doppler studies of the carotids were normal at that time as  well.   PHYSICAL EXAMINATION:  VITAL SIGNS: Examination today reveals a well-  developed male with blood pressure in the right and left arm of 100/60 and  110/60, heart rate 159.  NEUROLOGIC: Alert and oriented x3, followed 3-step commands.  Cranial nerve  examination revealed visual fields to be full, right ptosis, disks flat.  Extraocular movements full, corneals present.  No seventh nerve palsy.  Tongue midline, uvula midline.  Gags present.  Sternocleidomastoid and  trapezius testing normal.  On motor examination, good strength in upper and  lower extremities, decreased vibration sense in lower extremities.  Two-  point discrimination, graphesthesia equal, outstretched hand and arm tremor.  Deep tendon reflexes 1 to 2+, absent ankle jerks, and downgoing plantar  responses.   MEDICATIONS AT THE TIME OF THIS HOSPITALIZATION:  1.  Lithium 600 mg nightly.  2.  __________  500 mg 2 per day.  3.  Lisinopril 40 mg per day.  4.  Cardizem 180 mg per day.  5.  Atenolol 50 mg per day.  6.  Toprol XL 100 mg per day.  7.  Digoxin 125 mcg daily.  8.  Aspirin 81 mg per  day.  9.  Coumadin.   IMPRESSION:  1.  Cluster headaches (Code 346.20).  2.  History of deep vein thrombosis involving the superior mesenteric and      abdominal veins.  3.  History of tachycardia-bradycardia syndrome.  4.  Pacemaker.  5.  Right lung mass with surgery.  6.  Status post left hip surgery.  7.  Status post surgery for avascular necrosis of the femoral head status      post surgery.   PLAN:  Recommend nasal oxygen for his headaches and to use Depakote 250 mg  ER.  Doubt he is a candidate for Indocin in view of the need for Coumadin.           ______________________________  Genene Churn. Sandria Manly, M.D.     JML/MEDQ  D:  10/25/2005  T:  10/26/2005  Job:  [id number]   cc:   Coralie Carpen, M.D.  Fax: [phone]

## 2010-10-05 NOTE — H&P (Signed)
NAME:  [name], [name]                          ACCOUNT NO.:  [id number]   MEDICAL RECORD NO.:  [id number]                   PATIENT TYPE:  IPS   LOCATION:  0500                                 FACILITY:  [facility]   PHYSICIAN:  Jeanice Lim, M.D.              DATE OF BIRTH:  08/26/1945   DATE OF ADMISSION:  09/17/2002  DATE OF DISCHARGE:                         PSYCHIATRIC ADMISSION ASSESSMENT   IDENTIFYING INFORMATION:  This is a voluntary admission.  He was transferred  from the 5000 unit at [facility] central.   REASON FOR ADMISSION AND SYMPTOMS:  The patient was thought to be suffering  from depression.  He has multiple stressors, medical and social, resulting  in depressed mood, appetite, energy, concentration, anhedonia, and  previously suicidal ideation.  Dr. Rico Junker saw the patient in consult  on April 29.  At that time the patient disclosed that his former wife, the  mother of his 56 children, had died in 17-May-2002 of cancer.  Apparently he had lived with this woman for 20 years, finally married her in  1990, lived with her for several years before moving out.  He established a  new significant relationship in 1999; however this woman left him in June of  2003.  At that time he became aware of how seriously ill his former wife was  and went back to helping her.  He states that he has had homicidal ideation  towards the former girlfriend and her new boyfriend.  Currently that is not  active.  He also acknowledges having lost weight, from 235 pounds to 155  pounds.  The patient was admitted on September 09, 2002, because of pain in his  left hip.  He underwent surgery later that day.  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   PAST PSYCHIATRIC HISTORY:  Apparently the patient has had no formal  treatment, either through therapy or through psychotropics.  However, he  does acknowledge  having had suicidal ideation in February of this year.   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   ALCOHOL AND DRUG ABUSE:  The patient has a positive history of alcoholic  gastritis; however he quit drinking in 2000.  Apparently, now he  acknowledges 3 drinks a day.  Tobacco use is 60/packs/year history.  Illegal  drugs:  He does use marijuana.   PAST MEDICAL HISTORY:  He has hypertension, coronary artery disease,  gastroesophageal reflux disease, chronic obstructive pulmonary disease.  He  is followed on an outpatient basis by Dr. Doylene Bode and Dr. Ardelle Anton.   MEDICATIONS:  Vioxx 5 mg daily, Prevacid 30 mg 1 daily, Digoxin 0.125  mg  p.o. daily, Lisinopril 25 mg 1 daily, Metoprolol 25 1/2 b.i.d.   ALLERGIES:  He is allergic to PENICILLIN.   FAMILY PSYCHIATRIC HISTORY:  His father died of alcoholic complications, and  medical things we already know about.   MENTAL STATUS EXAM:  His appearance is consistent with his postop status.  He uses a walker and a cane to get around.  He does appear to have lost  weight.  Otherwise he is neatly groomed and attentive to his appearance.  His speech is normal rate, rhythm and tone.  He had no psychotic content.  His mood was within normal limits.  He is currently denying suicidal or  homicidal ideation.  He requests to go home to be with his grandchildren.  His thought processes were clear, specifically memory and concentration were  intact.  Judgment and insight were intact, and he appears to be of at least  average intelligence.   PHYSICAL EXAMINATION:  Complete physical is forwarded from 5000.  He does  have sutures intact in his left hip.  They are to be removed if he is still  an inpatient on May 7.   ADMISSION DIAGNOSES:   AXIS I:  Major depressive disorder, recurrent,  severe.   AXIS II:  Deferred.   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   AXIS IV:  Severe.  Problems with primary support group, problems related to  social environment, occupational problems, and medical problems.   AXIS V:  Global assessment of function is currently 40.   PLAN:  To admit to provide a safe environment.  The patient is still at risk  to harm himself.  He will be begun on an antidepressant, probably Zoloft.  He will be exposed to unit and individual psychotherapy, and postop  discharge planning will be arranged with his daughter, Lamar Laundry.   ESTIMATED LENGTH OF STAY:  No more than 4 or 5 days.       Vic Ripper, P.A.-C.               Jeanice Lim, M.D.    MD/MEDQ  D:  09/18/2002  T:  09/19/2002  Job:  [id number]

## 2010-10-05 NOTE — Discharge Summary (Signed)
NAME:  [name], [name]                          ACCOUNT NO.:  [id number]   MEDICAL RECORD NO.:  [id number]                   PATIENT TYPE:  INP   LOCATION:  2925                                 FACILITY:  [facility]   PHYSICIAN:  Lonia Blood, MD                      DATE OF BIRTH:  08/26/1945   DATE OF ADMISSION:  12/10/2001  DATE OF DISCHARGE:  12/14/2001                                 DISCHARGE SUMMARY   DISCHARGE DIAGNOSES:  1. Atrial flutter with 2:1 transmission spontaneously converted to sinus     rhythm.  2. 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  3. History of supraventricular tachycardia converted to sinus rhythm in     April of 2002.  4. Single pulmonary nodule on the left lower lobe.  5. 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  6. Mild chronic obstructive pulmonary disease.  7. 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  8. Acute renal failure.   DISCHARGE MEDICATIONS:  1. Digoxin 0.25 mg p.o. q.d.  2. Metoprolol 100 mg p.o. q.d.  3. Protonix 40 mg p.o. q.d.  4. Coumadin 7.5 mg p.o. q.d. to follow up dosage and the proper adjustment     of doses in the Coumadin clinic with Dr. Michaell Cowing.   CONDITION ON DISCHARGE:  The patient was discharged in excellent condition.  He was instructed to follow up in the outpatient clinic on December 21, 2001 at  1445 p.m. for hospital follow up and then with his continuative physician in  the outpatient clinic, then he was instructed to call Dr. Roseanne Kaufman office  at [phone] to get an appointment for a follow up visit within a month, and  he was instructed to follow up in the Coumadin clinic as he already had an  appointment scheduled for the next week.   CONSULTANTS:  Dr. Algie Coffer, Cardiology.   PROCEDURES DURING ADMISSION:  The patient underwent transesophageal  echocardiography during this admission.  On the December 12, 2001 he showed  normal right  ventricle, normal left ventricle, normal right atrium, normal  left atrium appendage, normal left pulmonary veins, normal mitral valve,  normal tricuspid valve, normal pulmonic valve, normal aortic valve, no  vegetation, no pericardial effusion, normal descending aorta.  No flow over  the _______ septum, spontaneously converting to sinus rhythm during the  transesophageal echocardiography.  The patient underwent a 2-D  echocardiogram on December 11, 2001, transthoracic, which showed overall left  ventricular ________ function mildly decreased between 40 to 50%, mild  hypokinesia of the entire septal wall, left ventricular wall thickness  mildly increased, aortic valve regurg, left atrial size upper limits of  normal.  Left ventricular _____ function mildly reduced.  EKG on admission  showed atrial flutter with ventricular rate of 121.   ADMITTING HISTORY AND PHYSICAL:  This is  a 65 year old African American with  past medical history significant for supraventricular tachycardia, DVT and  ________thrombosis on chronic Coumadin with abnormal ejection fraction and  asymmetric septal hypertrophy who presented to the emergency department  because he was short of breath for the past 4 or 5 days.  He states that  whenever ___________ and he gets really dyspneic and diaphoretic.  He also  feels significant chest tightness mainly over the left sternal border  without radiation.  This has been progressively worse.  He reports  compliance with Atenolol and he mentions that his INR was sub-therapeutic on  one of his visits to the clinic.  He denies any lower extremity edema, no  erythema, no injury.   HOME MEDICATIONS:  Includes Atenolol, Coumadin and Vioxx.   ALLERGIES:  PENICILLIN.   SOCIAL HISTORY:  He lives alone.  He quit smoking 3 years ago but he started  smoking 2 months ago, 1 pack a day.  He also smokes pot from time to time.  He quit alcohol 2 years ago.  He is a patient of the outpatient  clinic and  he used to work as a Science writer for [employer].   FAMILY HISTORY:  His mother had diabetes, asthma and died from a heart  attack.  Father died of alcohol abuse related problems.  He has 8 children.  One of his daughters has DVT in her past medical history.   REVIEW OF SYSTEMS:  Positive for paroxysmal nocturnal dyspnea.  He needs to  sleep on 2 pillows.  No thyroid problems, no abdominal pain.  He has not  eaten for 5 to 6 days.  No nausea, no vomiting, no _________, no diarrhea,  no constipation.   PHYSICAL EXAMINATION:  VITAL SIGNS:  Height 145, BP 103/76.  99% on room air. Respiratory rate 18,  T 97.4.   GENERAL:  The patient is lying in bed without any complaints, alert and  oriented.   HEENT:  Pupils equal, round, reactive to light and accommodation.  Extraocular movements are intact. Dry mouth.  Poor dentition.   RESPIRATORY:  Coarse crackles bilaterally.   HEART:  Tachycardic.  Could not appreciate any murmur.   ABDOMEN:  Soft, non-tender, non-distended.  Bowel sounds are present.  No  hepatosplenomegaly.   EXTREMITIES:  Without any edema.   NEUROLOGIC:  Alert and oriented. No focal neurologic deficits.   LABS ON ADMISSION:  Hemoglobin 16, white blood cells 10.2, lymphocytes 149,  troponin 0.02, CK-MB 1.6, CK 11.  Sodium 139, potassium 3.2, chloride 103,  CO2 28, BUN 39, creatinine 2.1, glucose 106.  PT 25.5, INR 2.7, PTT 33.   EKG shows clear cut atrial flutter, transmitting 2:1.   HOSPITAL COURSE:  1. Atrial flutter.  This was a new onset of atrial flutter in a patient     known to have sinus rhythm before.  He was given in the emergency     department 20 mg IV of Cardizem and he became severely hypotensive with     blood pressure dropping in the 40's.  He was bolused with 500 cc of     normal saline and he was given also calcium chloride.  His blood pressure     came back to 100/60 but he remained in atrial flutter.  We consulted Dr.    Algie Coffer  who started him on digoxin IV load and then digoxin by mouth.  He     gave him metoprolol IV and then metoprolol by  mouth and also Cardizem 10     mg IV and then Cardizem by mouth.  He responded well with the fluids.     His blood pressure held.  The decision was made to try to convert him to     sinus rhythm electrically.  A transesophageal echocardiogram was done to     rule out the presence of thrombi in the left atrium appendage.  During     the procedure he spontaneously converted to sinus rhythm and maintained     the sinus rhythm for 2 days even when moving around.  He was discharged     on just metoprolol and digoxin.  His heart rate was below 50.  Dr.     Algie Coffer recommended also starting the Cardizem if the heart rate goes     above 60.   1. History of deep vein thrombosis.  We will continued his anticoagulation     with Coumadin and he is supposed to follow up in the Coumadin clinic for     check of the INR.  ___________  response to be excellent to IV normal     saline.   1. Hypokalemia corrected with p.o. potassium chloride.   1. Tobacco abuse.  I advised him again to quit smoking as he used to do that     in the past, and then he stopped and started again.  He thinks he might     consider stopping this time again.   1. Left thorax mass.  Nodule per chest x-ray stable.  Follow up by Dr.     Edwyna Shell, thoracic surgery.   LABORATORIES ON DISCHARGE:  Sodium 144, potassium 2.8, chloride 110, CO2 29,  BUN 11, creatinine 1.1, glucose 104, potassium 8.8, PT 21, INR 2, white  blood count 6.3, hemoglobin 12.7, hematocrit 37.4 and __________ 99.                                                 Lonia Blood, MD    SL/MEDQ  D:  12/15/2001  T:  12/19/2001  Job:  [id number]   cc:   OUTPATIENT CLINIC   [name], M.D.

## 2010-10-05 NOTE — Discharge Summary (Signed)
NAME:  [name], [name]                ACCOUNT NO.:  [id number]   MEDICAL RECORD NO.:  [id number]          PATIENT TYPE:  INP   LOCATION:  6736                         FACILITY:  [facility]   PHYSICIAN:  [name], M.D.  DATE OF BIRTH:  [birth year]   DATE OF ADMISSION:  11/06/2005  DATE OF DISCHARGE:  06/07/2005                                 DISCHARGE SUMMARY   PRINCIPAL DIAGNOSES:  1.  Atrial fibrillation.  2.  Chronic atrial obstruction.  3.  Hypertension.  4.  Tobacco use disorder.  5.  Cardiac pacemaker in situ.  6.  Depressive disorder.   DISCHARGE MEDICATIONS:  1.  Remeron 30 mg at bedtime.  2.  Wellbutrin XL 150 mg one daily.  3.  Aspirin 81 mg daily.  4.  Coumadin 10 mg one daily.  5.  Lovenox 80 mg subcutaneously twice daily for three days.  6.  Atenolol 100 mg one daily.  7.  Lisinopril 20 mg one daily.  8.  Prevacid 20 mg one daily.  9.  Hydrocodone 5/500 mg three daily as needed.  10. Diltiazem 180 mg daily.  11. Lanoxin 0.125 mg daily.  12. Nicotine patch as directed as 21 mg, 14 mg, and 7 mg weekly basis.   FOLLOWUP:  By Dr. Orpah Cobb in two weeks and Dr. Hortencia Pilar in 2-4 weeks at  mental health services.   SPECIAL INSTRUCTIONS:  1.  The patient is to have prothrombin time on Mondays times two weeks, and      then once a month.  2.  Discharge diet:  Low-fat, low-salt diet.  3.  Discharge activity:  The patient is to increase activity slowly.   HISTORY:  This 65 year old black male presented with palpitations,  dizziness, along with EKG showing atrial flutter with rapid ventricular  response.  The patient has a past medical history positive for hypertension,  smoking, atrial flutter/fibrillation, deep venous thrombosis, tachy-brady  syndrome, depression, gastritis, and osteoarthritis.   PHYSICAL EXAMINATION:  Pulse 150, respirations 15, blood pressure 130/98,  height 6 feet 3 inches, weight 198 pounds.  GENERAL:  The patient was alert, oriented x3.  HEENT:  The patient is normocephalic, atraumatic with brown eyes.  Wears  glass.  Edentulous currently.  Mucous membranes pink and moist.  NECK:  No JVD, no carotid bruits on range of motion of the neck.  LUNGS:  Clear bilaterally.  HEART:  S1, S2 with tachycardia.  ABDOMEN:  Soft, nontender.  EXTREMITIES:  1+ edema, no cyanosis.  Positive clubbing.  CENTRAL NERVOUS SYSTEM:  Cranial nerves grossly intact.  The patient has  bilateral equal grip.   LABORATORY DATA:  Normal hemoglobin, hematocrit, WBC count, platelet count.  Normal electrolytes, BUN, creatinine.  Normal liver enzymes.  Albumin  borderline 3.3, bilirubin borderline at 1.3.  Cardiac enzymes normal x2.  Lipid profile unremarkable except for HDL cholesterol of 37.  Thyroid  stimulating hormone was 1.195.  EKG showed electronic ventricular pacemaker  after admission EKG of atrial flutter with 2:1 block.  Echocardiogram showed  overall normal left ventricular systolic function with a mild aortic  valve  regurgitation, mild mitral valve regurgitation, and mild left atrial  dilatation.   HOSPITAL COURSE:  The patient was admitted to telemetry unit.  He was  started on IV Cardizem 15 mg bolus and 10 mg drip.  His home medications  were also started.  He received three doses of Lanoxin.  On the second day  of hospitalization he was switched to oral medications.  His Coumadin 10 mg  dose was restarted.  Coverage was provided with the Lovenox 80 mg  subcutaneously twice daily.  His echocardiogram was within normal left  ventricular systolic function and his laboratory data was stable, hence he  was discharged home in satisfactory condition with followup by me in two  weeks.      [name], M.D.  Electronically Signed     ASK/MEDQ  D:  11/06/2005  T:  11/06/2005  Job:  [id number]

## 2010-10-05 NOTE — Discharge Summary (Signed)
[place]  Patient:    [name], [name]                       MRN: [id number] Adm. Date:  [id number] Disc. Date: [id number] Attending:  Madaline Guthrie Dictator:   Duncan Dull, M.D. CC:         Karl Pock, M.D.  [name], M.D.  Norton Blizzard, M.D.   Discharge Summary  DATE OF BIRTH:  August 26, 1945  DISCHARGE DIAGNOSES: 1. Supraventricular tachycardia with conversion to normal sinus rhythm. 2. 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 3. Avascular necrosis of hips bilaterally, stable. 4. Single pulmonary nodule, right middle lobe. 5. History of alcoholic gastritis. 6. History of tobacco abuse. 7. History of alcohol abuse. 8. Chronic obstructive pulmonary disease, mild. 9. Thrombocytopenia, presumed secondary to heparin therapy.  DISCHARGE MEDICATIONS: 1. Atenolol 12.5 mg p.o. b.i.d. 2. Coumadin 10 mg Monday, Wednesday, Friday and Sunday, 15 mg on the other    days until he sees Dr. Lucas Mallow in the Coumadin clinic. 3. Vioxx 12.5 mg one to two p.o. q.d. p.r.n. pain. 4. Albuterol metered dose inhaler 2 puff q.6h. p.r.n. wheezing. 5. Atrovent metered dose inhaler 2 puffs q.i.d. 6. Pepcid 20 mg p.o. b.i.d.  DISCHARGE INSTRUCTIONS:  The patient is to follow up with his primary care physician, Dr. Karl Pock on Thursday afternoon, April 19, at 1:45. Additionally, he has an appointment in the Coumadin clinic for that day as well to check his PT and INR and adjust his Coumadin anticoagulation as necessary to maintain INR between 2 and 3 for his coagulopathy.  Thirdly, he will have a repeat CT of the chest in 2-3 months per Dr. Edwyna Shell of [facility] to follow up on the right middle lobe nodule.  PROCEDURES: 1. April 8.  A 2-D echo of the heart showing the following information:    a. Overall left ventricular systolic function was normal with ejection       fraction between 55 and 65%, no regional  wall motion abnormalities and       moderate asymmetric septal hypertrophy.    b. Mildly increased ______ thickness, trivial aortic valvular       regurgitation, moderate mitral annular calcification, mildly dilated       left atrium. 2. Lower extremity venous Dopplers on August 21, 2000 with no evidence of DVT,    superficial thrombosis or Bakers cyst bilaterally. 3. August 22, 2000.  Adenosine cardiolite showing no evidence of    pharmacologically induced myocardial ischemia.  Mild global hypokinesis and    resting left ventricular ejection fraction of 46%. 4. Pulmonary function tests dated August 22, 2000.  The interpretation by Dr.    Maple Hudson showing mild restriction on spirometry due to air trapping and    obstructive airways disease, confirmed by increased residual volume.  Mild    obstructive airway disease was not responsive to bronchodilator therapy.    There was normal diffusion capacity. 5. August 20, 2000.  ______  CT of the chest showing no evidence for pulmonary    emboli.  Positive pleural parenchymal scarring changes in the left    hemithorax which were stable.  Additionally, there was an 8-9 mm spiculated    nodule within the right middle lobe, worrisome for small primary carcinoma    of the lungs versus nodular scarring. 6. CT of the head without contrast done on  April 3 was normal.  CONSULTANTS: 1. Dr. Edwyna Shell, [facility], August 22, 2000.  Re: Evaluation of right middle lobe    nodule found by CT scan of chest. 2. Dr. Algie Coffer, cardiology.  BRIEF ADMISSION HISTORY AND PHYSICAL:  The patient is a 65 year old African-American male who was evaluated in the emergency room with a history of shortness of breath and lightheadedness, weakness, dizziness, fatigue and constant chest pain since Saturday prior to admission.  The pain began while the patient was in bed, but he went to work anyway on Monday and has had pleuritic chest pain radiating from right to left anteriorly since then.   He reports a positive paroxysmal nocturnal dyspnea, but this has been chronic. He has also had a mild nonproductive cough without hemoptysis.  He does report wheezing.  It is notable that he stopped his Coumadin therapy three weeks ago secondary to financial problems.  He developed a fever and chills, but has also had left lower extremity weakness for the past 3-4 months.  His medications prior to admission include Coumadin 10 mg q.d., which he has been off of for three weeks and Vioxx 12.5 mg p.o. q.d. for hip pain.  ALLERGIES:  PENICILLIN.  SOCIAL HISTORY:  Positive tobacco and alcohol history, but quit tobacco two years ago after a 60-pack-year history and quit alcohol two years ago as well.  FAMILY HISTORY:  Positive for diabetes, asthma and alcoholism.  REVIEW OF SYSTEMS:  Positive for fatigue, headaches, palpitations, paroxysmal nocturnal dyspnea, wheezing, dyspnea on exertion, shortness of breath, cough. Positive for left hip pain, left lower extremity weakness, no numbness, no bright red blood per rectum, no melena, no anxiety.  Positive mild depressive symptoms without loss of sleep or suicidal ideation.  Vital signs on admission to the ED:  Temperature is 98.5, blood pressure was 132/101, pulse was 154, respiratory rate 20.  Patient was saturating 100% on 2 liters.  PHYSICAL EXAMINATION:  Notable for poor dentition with several fractured teeth, right middle and lower lobe wheezing with mildly decreased breath sounds in the bases.  CARDIOVASCULAR:  Cardiovascular exam was tachycardic with regular rhythm, S1, S2, no murmurs, rubs or gallops.  EXTREMITIES:  Positive for trace edema with pulses 2+ bilaterally.  NEUROLOGIC:  Grossly nonfocal.  ADMITTING LABORATORY DATA:  White blood count of 8.4, hemoglobin of 16.8,  platelets of 209, sodium 141, potassium 4.2, chloride 106, bicarb 29, BUN 14, creatinine 1.1, glucose 101, calcium 9.7, total protein 7.3.  ______ 4.0,  AST 15, ALT 13, alk phos 60, total bili 1.2.  First set of CK enzymes was 94, 0.8 for MB fraction and 0.03 for troponin I.  Protime was 13.2, INR 1.0, PTT was 27.  Chest x-ray showed left lower lobe atelectasis versus pneumonia.  EKG showed supraventricular tachycardia, which subsequently converted to aflutter and in normal sinus rhythm.  HOSPITAL COURSE: #1 - Supraventricular tachycardia.  The patient was started on IV Cardizem drip upon evaluation in the ER.  Additionally, he was given sublingual nitrogen and IV heparin was started.  His rhythm converted to aflutter and internal sinus rhythm and the Cardizem drip was eventually stopped.  He was ruled out for acute coronary syndrome with negative cardiac enzymes and repeat EKGs.  Once he was stabilized, he was sent for a chest CT, which ruled out pulmonary embolism as a source of his supraventricular tachycardia and chest pain.  His chest pain resolved with normalization of his rate and rhythm and the use of nitroglycerin sublingual.  Adenosine cardiolite ruled out pharmacologic induced ischemia as well as resting ischemia.  He was admitted to telemetry bed and was put initially on Toprol XL 12.5 mg p.o. b.i.d. However, patient remained very bradycardic on this dose and his dose was reduced to 12.5 mg p.o. q.d. with normalization of his rate.  At the time of discharge, due to his initial situation, he was switched to Atenolol 12.5 mg p.o. b.i.d. as Toprol XL was not carried by the pharmacy where he would be getting his medications.  He is instructed by Dr. Algie Coffer to follow up with him in his office in 2-3 weeks for evaluation of his cardiac status.  #2 - Right middle lobe nodule.  The patients chest pain was evaluated with a spiral CT to rule out any events of pulmonary embolus.  The right middle lobe nodule was an incidental finding and given his tobacco history, this is worrisome for a primary lung cancer.  Dr. Edwyna Shell of [facility] was  consulted and evaluated the patient with the decision to reevaluate the nodule with a followup CT scan in 2-3 months as it was too small for a ______ scan and would be difficult to assess with a needle biopsy.  #3 - Chronic obstructive pulmonary disease.  Given the patients history of wheezing as well as his incidental finding of a right middle lobe nodule, he underwent pulmonary function tests which confirmed mild obstructive airway disease with no significant response to bronchodilators.  He received albuterol and Atrovent nebulizer treatments during admission, which greatly alleviated his symptoms of wheezing.  At the time of discharge, he was given prescriptions for albuterol and Atrovent metered dose inhalers and instructed on how to use them.  This was not a problem as he had used them in the past.  #4 - Coagulopathy with history of deep vein thrombosis and portal vein thrombosis.  The patients Coumadin therapy had been discontinued three weeks prior to admission.  Upon admission, he was started on IV heparin and switched to subcutaneous Lovenox as well as oral Coumadin.  CT of the chest and Dopplers of the lower extremities revealed no new thrombus or embolisms.  It took several days for his Coumadin dose to be therapeutic and he was continued on subcutaneous Lovenox until his INR reached 2.2 on the day of discharge.  He was instructed to take 10 mg alternating with 15 mg every other day until he would be followed up in the Coumadin clinic and the internal medicine clinic on April 19.  Again, given his difficulty with paying for his medications, he was given prescriptions and made eligible for medications through the [facility] pharmacy by social work prior to discharge.  #5 - Left lower extremity weakness with history of avascular necrosis.  The patient gave a history of 3-4 months of increased pain and weakness in the left lower extremity.  The left hip was reviewed with  radiographic films and there was no evidence of progression of his aseptic necrosis compared to his films on the previous year.  His Vioxx was continued at 12.5 to 25 mg p.o. q.d.  At the time of discharge, he was unable to fill the medication through the hospital due to the cost of it, so he was given a prescription for 800 mg of Ibuprofen t.i.d. x one week until he would see Dr. Valentina Lucks in the internal medicine clinic for hospital followup.  At that point, he should be switched to Vioxx again as he has a history of  alcoholic gastritis and peptic ulcer disease and should not be on Ibuprofen for a prolonged period of time.  The patients pertinent labs at the time of discharge on April 12 were a PT of 20.3 with an INR of 2.2.  Hemoglobin and hematocrit on April 11 were 15 and 44 with platelets of 158.  #6 - Thrombocytopenia.  The patient was started on IV heparin upon admission. His initial platelet count was 209, which after initiation of heparin therapy, his platelets dropped to 146 and reached a nadir of 139.  IV heparin and therapy was discontinued and subcutaneous Lovenox was used with normalization of platelets by day #4 of hospitalization.  Platelets were 158 on April 11. PERTINENT DISCHARGE LABORATORY DATA:  Hemoglobin and hematocrit 15 and 44 on April 11, platelets of 158, PT was 20.3 and INR of 2.2 on April 12.  The patients heart rate, after adjustment of his beta blocker was in the 70s and 80s on the day of discharge. DD:  08/31/00 TD:  08/31/00 Job: [id number]

## 2010-10-05 NOTE — H&P (Signed)
NAME:  [name], [name]                          ACCOUNT NO.:  [id number]   MEDICAL RECORD NO.:  [id number]                   PATIENT TYPE:  INP   LOCATION:  NA                                   FACILITY:  [facility]   PHYSICIAN:  Ronnell Guadalajara, M.D.                DATE OF BIRTH:  08/26/1945   DATE OF ADMISSION:  09/09/2002  DATE OF DISCHARGE:                                HISTORY & PHYSICAL   CHIEF COMPLAINT:  Pain in left hip.   HISTORY OF PRESENT ILLNESS:  This 65 year old black male who has been having  progressive problems concerning his left hip.  He has had increasing pain  into this hip now for several years but most recently has been markedly  severe to the point where he now must use a cane for ambulation.  He has had  a rather problematic medical history in the past.  He is being seen  for  various medical problems in the past.  He tells me that he is currently  being seen by Dr. Lavera Guise of [facility].  Also, according to Dr.  Celene Skeen notes he has been seen by Dr. Orpah Cobb and will get a medical  clearance from either Dr. Lonia Blood or Dr. Orpah Cobb.  Hopefully if the  medical clearance is okay and we can get the patient off of his Coumadin  prior to surgery then we can go ahead with his surgery.   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  This was  confirmed by x-rays as well as MRI of the left hip.  He actually has  bilateral femoral head osteonecrosis  but the collapse is noted most  significantly in the left hip.  He has a large cystic appearing lesion in  the left femoral neck which is compatible with an interosseous ganglion,  also, collapse of the left femoral head.   Once we have medical clearance we will proceed with the above surgery.  The  patient will probably have inpatient rehabilitation after this surgical  procedure.   PAST MEDICAL HISTORY:  The patient denies any major surgeries.  He is being  seen in the clinic for  hypertension, coronary artery vessel disease,  intermittent angina, heart disease, reflux disease.   CURRENT MEDICATIONS:  1. Coumadin 10 to 15 mg every other day.  2. Vioxx 25 mg two daily.  3. Prevacid 30 mg one daily.  4. Digoxin 0.125 mg one daily.  5. Lisinopril 25 mg one daily.  6. Metoprolol 25 mg one-half tablet b.i.d.  7. Nitrostat 0.4 mg PRN.  He last used this about three weeks ago.   ALLERGIES:  PENICILLIN.   SOCIAL HISTORY:  The patient is widowed as of January.  He is disabled taxi  cab driver/dispatcher.  He smokes about one-half pack of cigarettes per day.  He has at least three drinks per  day to calm him down.  He has ten  children and a friend will take care of him after his surgery.   REVIEW OF SYMPTOMS:  CNS:  No seizure disorder, paralysis, numbness or  double vision.  RESPIRATORY:  Patient has some shortness of breath with  ambulation say from the house to the mailbox, has continuous  hyperventilation episodes as well.  No hemoptysis.  CARDIOVASCULAR:  The  patient had an episode of angina relieved with his Nitrostat about three  weeks ago.  No recent chest pain.  No orthopnea.  GASTROINTESTINAL:  No  nausea and vomiting, melena or bloody stool.  The patient does have reflux  and does well with his medications.  GENITOURINARY:  No discharges or  hematuria.  MUSCULOSKELETAL:  Primarily in history of present illness with  his hips.   PHYSICAL EXAMINATION:  GENERAL:  He is an alert and cooperative, fully  oriented 65 year old black male whose walks with a cane for ambulation.  VITAL SIGNS:  Blood pressure 148/78, pulse 60 and regular.  Respirations 12.  HEENT:  Normocephalic.  PERRLA.  EOM intact.  Oropharynx is clear.  CHEST:  Clear to auscultation.  No rhonchi or rales.  HEART:  Regular rate and rhythm.  No murmurs are heard.  ABDOMEN:  Soft, nontender.  Liver and spleen not felt.  GENITOURINARY/RECTAL:  Not done, not pertinent to present illness.   EXTREMITIES:  Has pain with range of motion of both hips, most significant  on the left.  NEUROVASCULAR:  Intact to the left lower extremity.   ADMISSION DIAGNOSES:  1. Aseptic necrosis both hips, worse on the left than on the right.  2. Coronary artery disease.  3. Hypertension.  4. Gastroesophageal reflux disease.   PLAN:  The patient will be admitted for bipolar hemiarthroplasty to the left  hip versus total hip replacement arthroplasty.  Inspection of the acetabulum  will be done at the time of surgery and this will determine whether we will  go for full total hip or a bipolar hemiarthroplasty.  We will certainly ask  Dr. Algie Coffer and Dr. Lonia Blood to follow along with [place] during this patient's  hospitalization.  We will rely on these physicians to give medical clearance  prior to surgery.   Again, as mentioned above, the patient will in all probability require [facility] inpatient rehabilitation depending on his level of activity after his  surgery.     Dooley L. Cherlynn June.                 Ronnell Guadalajara, M.D.    DLU/MEDQ  D:  08/31/2002  T:  08/31/2002  Job:  [id number]   cc:   [name], M.D.  [address]  [place] [address]   Lonia Blood, M.D.  [address] [place], [place] [address]  Fax: [phone]

## 2010-10-05 NOTE — H&P (Signed)
NAME:  [name], [name]                ACCOUNT NO.:  [id number]   MEDICAL RECORD NO.:  [id number]          PATIENT TYPE:  INP   LOCATION:  2018                         FACILITY:  [facility]   PHYSICIAN:  [name], M.D.  DATE OF BIRTH:  1946-05-04   DATE OF ADMISSION:  06/05/2005  DATE OF DISCHARGE:                                HISTORY & PHYSICAL   CHIEF COMPLAINT:  Heart palpitations.   HISTORY OF PRESENT ILLNESS:  This 65 year old black male has a 2-day history  of palpitations, dizziness, leg swelling and tightness without significant  chest pain.   PAST MEDICAL HISTORY:  1.  Negative for diabetes.  2.  Positive for hypertension.  3.  Positive for smoking.  Negative for alcohol use.  Negative for drug use.  4.  No history of elevated cholesterol level, myocardial infarction, obesity      or premature coronary artery disease in family.  5.  Positive history of atrial flutter/fibrillation.  6.  Positive history of deep vein thrombosis.  7.  History of alcohol abuse.  8.  Gastritis.  9.  Tachycardia-bradycardiac syndrome with a pacemaker placement.  10. Osteoarthritis.  11. Erectile dysfunction.  12. Depression.   PAST SURGICAL HISTORY:  1.  Right wrist cyst removal.  2.  Right lung chest mass surgery.  3.  Right hip surgery for avascular necrosis.  4.  Permanent pacemaker placement in October of 2004.   MEDICATIONS:  1.  Remeron 30 mg at bedtime.  2.  Wellbutrin XL 150 mg one daily.  3.  Aspirin 81 mg one daily.  4.  Atenolol 100 mg one daily.  5.  Lisinopril 20 mg one daily.  6.  Famotidine 20 mg one daily.  7.  Diclofenac 75 mg one twice daily.   ALLERGIES:  PENICILLIN gives a rash.   PERSONAL HISTORY:  The patient is separated for 30 years.   FAMILY HISTORY:  Mother died of asthma in her 69s.  Also had a myocardial  infarction.  Father died of old age at 17 years and had a history of alcohol  use.  The patient had 5 brothers - 4 died, 1 living.  Had 6 sisters -  2  died, 4 living.   REVIEW OF SYSTEMS:  The patient denies recent weight gain, weight loss, any  eye surgery, hearing loss, hemoptysis, asthma, pneumonia, nausea, vomiting,  diarrhea, bleeding from the bowel, hepatitis, blood transfusion, kidney  stones, stroke, seizures, or psychiatric admissions.  Admits to vision  changes, wearing glasses, full upper and lower dentures, chronic obstructive  lung disease, heart palpitations, chest pain, edema, claudication and  constipation.   PHYSICAL EXAMINATION:  VITAL SIGNS:  Pulse 150, respirations 15, blood  pressure 130/98, height 6 feet 3 inches, weight 198 pounds.  GENERAL:  The patient was alert and oriented x3.  HEENT:  The patient is normocephalic and atraumatic with brown eyes, wearing  glasses.  Edentulous currently.  Mucous membranes are pink and moist.  NECK:  No JVD, no carotid bruits.  Full range of motion of the neck.  LUNGS:  Clear bilaterally.  HEART:  Normal S1 and S2.  Tachycardia.  ABDOMEN:  Soft, nontender.  EXTREMITIES:  There was 1+ edema.  No cyanosis.  Positive clubbing.  CNS:  Cranial nerves grossly intact.  The patient has bilaterally equal  grips.   LABORATORY DATA:  Pending.   EKG:  Atrial flutter with a rapid ventricular response.   ASSESSMENT:  1.  Atrial fibrillation/flutter.  2.  Chest pain.  3.  Bilateral leg edema.  4.  Weakness and palpitations and dizziness.   PLAN:  Continue home medications.  Start IV Cardizem bolus with 10 mg  followed by 10 mg per hour drip.  Additional cardiac evaluation as needed.      [name], M.D.  Electronically Signed     ASK/MEDQ  D:  06/05/2005  T:  06/05/2005  Job:  [id number]

## 2010-10-05 NOTE — Discharge Summary (Signed)
NAMEALVARO, [name]                ACCOUNT NO.:  [id number]   MEDICAL RECORD NO.:  [id number]          PATIENT TYPE:  INP   LOCATION:  2011                         FACILITY:  [facility]   PHYSICIAN:  [name], M.D.  DATE OF BIRTH:  02-06-1946   DATE OF ADMISSION:  03/20/2008  DATE OF DISCHARGE:  03/30/2008                               DISCHARGE SUMMARY   FINAL DIAGNOSES:  1. Atrial fibrillation.  2. Pneumonia, nonspecific organism.  3. Paralytic ileus.  4. Hypertension.  5. Leukocytosis.  6. Clostridium difficile colitis.  7. Depressive disorder.  8. Tobacco use disorder.  9. Long-term use of anticoagulants.   DISCHARGE MEDICATIONS:  1. Coumadin 5 mg daily in the evening.  2. Prevacid 30 mg daily.  3. Megace 40 mg per mL 5 mL twice daily.  4. Lanoxin 0.125 mg one daily.  5. Percocet 5/325 mg one twice daily.  6. Diltiazem extended release 300 mg one daily.  7. Metronidazole 500 mg three times daily x3 days.  8. Coreg 12.5 mg one twice daily.  9. Lasix 40 mg one daily on Monday, Wednesday, and Friday.  10.ProAir 108 mcg per actuation 2 puffs four times daily inhalations.  11.Promethazine 25 mg one four times daily as needed for nausea and      vomiting.   DISCHARGE DIET:  Low-sodium heart-healthy diet.   DISCHARGE ACTIVITY:  The patient to increase activity slowly.   SPECIAL INSTRUCTION:  The patient to stop any activity that causes chest  pain, shortness of breath, dizziness, sweating, or excessive weakness.   Follow up by Dr. Orpah Cobb in 2 weeks and home health to check PT and  OT plus nurse visit.   HISTORY:  This 65 year old black male with a recent history of fracture  of the hip presented with palpitations.  The patient claims after  admission to the local nursing home.  No medications were given for 3  days.   PHYSICAL EXAMINATION:  VITAL SIGNS:  Pulse 150, after medications came  down to 80 beats per minute; respiration 18; blood pressure 90/50, and  subsequent blood pressure 130/80.  GENERAL:  The patient is well-built, adequately nourished black male in  mild distress.  HEENT:  The patient is normocephalic and atraumatic.  Brown eyes, wears  glasses.  Ear, nose, and throat grossly unremarkable.  NECK:  No JVD.  No carotid bruit.  LUNGS:  Clear bilaterally.  HEART:  Normal S1-S2 with a great 2/6 systolic murmur.  ABDOMEN:  Soft with mild generalized tenderness all over the abdomen.  EXTREMITIES:  No edema, cyanosis, or clubbing.  SKIN:  Warm and dry.  NEUROLOGICALLY:  The patient moves all four extremities.  Cranial nerves  grossly intact.   LABORATORY DATA:  The patient's CBC was unremarkable except for WBC  count of 13,800.  INR was 2.3 on March 18, 2008.  Lanoxin level was  falsely high at 5.7.  Subsequent Lanoxin level was normal.  Hemoccult  positive.  B natriuretic peptide elevated at 380.  Electrolytes normal.  C. difficile toxin test positive.   HOSPITAL COURSE:  The  patient was admitted to telemetry unit.  He was  started on IV Cardizem drip and IV metoprolol 5 mg x1 was given along  with IV Protonix.  His condition improved within 24 hours as IV Cardizem  was discontinued.  His Coumadin was resumed.  He was given Percocet for  pain control.  His overall condition appeared stable; however, he  developed profuse diarrhea.  GI consult was obtained.  C.  Difficile  test was checked x 3 and Flagyl 500 mg three times daily was started.  He required IV Cardizem and increasing dose of metoprolol for his atrial  fibrillation rate control.  His condition gradually improved with Flagyl  and hydration.  The patient was scheduled to get transferred to area  nursing home.  However, he declined going back to the nursing home.  Hence, he was sent home on March 30, 2008, with home PT, OT, and  nurse visit.  He will be folowed by me in 2 weeks.      [name], M.D.  Electronically Signed     ASK/MEDQ  D:  04/27/2008   T:  04/28/2008  Job:  [id number]

## 2010-10-05 NOTE — Cardiovascular Report (Signed)
NAME:  BERTHEL, BAGNALL                          ACCOUNT NO.:  [id number]   MEDICAL RECORD NO.:  [id number]                   PATIENT TYPE:  INP   LOCATION:  3715                                 FACILITY:  [facility]   PHYSICIAN:  Aram Candela. Aleen Campi, M.D.              DATE OF BIRTH:  08/26/1945   DATE OF PROCEDURE:  02/25/2003  DATE OF DISCHARGE:                              CARDIAC CATHETERIZATION   REFERRING PHYSICIAN:  Fransisco Hertz, M.D. and [name], M.D.   PROCEDURE:  Insertion of single chamber permanent transvenous pacemaker VVI  mode.   INDICATIONS FOR PROCEDURES:  Tachybrady syndrome.   PROCEDURE:  After signing an informed consent, the patient was brought to  the Cardiac Catheterization Laboratory where his right anterior chest and  base of neck were prepped and draped in a sterile fashion and a right  transverse subclavicular plane was anesthetized locally with 1% lidocaine.  An incision was made in this anesthetized plane with the incision being  deepened into the fascial layer overlying the pectoralis muscle.  A pocket  was then formed in this fascial layer for later insertion of the pulse  generator.  An 8-French Cook introducer sheath was then inserted  percutaneous into the right subclavian vein with the Seldinger wire being  easily passed into the superior vena cava.  We then selected a pacesetter  pacing electrode bipolar model #1336T serial [id number].  After proper  preparation the ventricular lead was inserted through the 8-French sheath  and advanced to the superior vena cava.  The sheath was then removed in the  usual fashion.  The ventricular lead was then advanced into the right atrium  and right ventricle where the tip was positioned in the apex of the right  ventricle.  Very good pacing parameters were obtained with a minimum voltage  threshold of 0.6 volts utilizing 1.0 milliamps of current.  The R-wave  sensitivity measured 6.9 millivolts and the  resistance was measured at 662  ohms.  After obtaining these pacing parameters the lead was secured properly  at its insertion site.  We then selected a [employer] pulse generator  identity SR model [id number] serial [id number].  After properly analyzing the pulse  generator it was attached to the pacing electrode in the usual fashion.  Pulse generator was then placed within the previously formed pocket and the  wound was closed in layers using 2-0 Dexon.  Final skin closure was closed  with a cutaneous layer of Steri-Strips.  The patient tolerated the procedure  well and no complications were noted.  At the end of the procedure a sterile  bulky dressing was applied to the wound and he was returned to his room in  satisfactory condition.    MEDICATIONS GIVEN:  None.   Pacemaker is noted to be functioning normally in the VVI mode.  John R. Aleen Campi, M.D.    JRT/MEDQ  D:  02/25/2003  T:  02/25/2003  Job:  [id number]   cc:   [name], M.D.  [address]  [place] [address]   Cath Lab

## 2010-10-05 NOTE — Discharge Summary (Signed)
NAME:  [name], [name]                ACCOUNT NO.:  [id number]   MEDICAL RECORD NO.:  [id number]          PATIENT TYPE:  OUT   LOCATION:  EKG                          FACILITY:  [facility]   PHYSICIAN:  Ronda Fairly, M.D.    DATE OF BIRTH:  05-06-1946   DATE OF ADMISSION:  11/06/2005  DATE OF DISCHARGE:                                 DISCHARGE SUMMARY   DISCHARGE DIAGNOSES:  1.  Hyperkalemia, secondary to continued use of NSAIDs(Non steroidal      Antiinflammatory Drugs)  2.  Renal insufficiency.  3.  History of atrial flutter with rapid ventricular response.  4.  History of deep venous thrombosis , portal vein thrombosis.  5.  Chronic cluster headaches.   DISCHARGE MEDICATIONS:  1.  Prevacid 30 mg p.o. once daily.  2.  Aspirin 81 mg p.o. once daily.  3.  Depakote 250 mg p.o. once daily.  4.  Digoxin 0.25 mg p.o. daily.  5.  Cardizem PR 240 mg once daily.  6.  Metoprolol 100 mg p.o. daily.  7.  Coumadin 5 mg on Monday, Wednesday, and Friday and 7.5 mg on Tuesday,      Thursday, Saturday, and Sunday.  8.  Relafen 500 mg 1-2 tablets b.i.d. p.r.n.   CONDITION ON DISCHARGE:  The patient was stable at the time of discharge.  He will be seen by Dr. Silvestre Mesi at the outpatient clinic on November 12, 2005 at 2  p.m.  He also has an appointment at the Coumadin clinic on June 23,2007.  At  that visit his BMET needs to be checked to see if he still has elevated  potassium.  His BUN and creatinine also need to be checked as it was  slightly high during this admission.  He also need to be evaluated to see if  he is no longer taking the ACE inhibitors which he was supposed to be off.   PROCEDURES:  None.   CONSULTATIONS:  None.   ADMITTING HISTORY AND PHYSICAL EXAMINATION:  Mr. Biela is a 65 year old  gentleman who's primary physician Dr. Coralie Carpen.  He was recently  admitted with atrial flutter with RVR on October 21, 2005 - June 9,2007.  He was  treated medically.  Since discharge his  creatinine and potassium have been  increasing although his ACE inhibitors were discontinued in the last clinic  visit.  He had come for a hospital followup yesterday and was found to have  a potassium of 6.1 and a creatinine of 1.6.  His last creatinine was 1.2 on  October 25, 2005.  It was unclear if the patient was taking ACE inhibitors at  home.  The patient was asymptomatic and EKG did not show peaked T-waves.   ALLERGIES:  Penicillin.   PAST MEDICAL HISTORY:  Significant for:  1.  Atrial flutter with RVR.  2.  Tachy-brady syndrome, status post pacemaker.  3.  History of cluster headache, on Depakote.  4.  Depression/bipolar disorder, previously on Lithium.  5.  History of DVT of the right leg in 1999.  6.  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  7.  History of AVN of left hip, status post replacement.  8.  History of COPD.  9.  History of benign pulmonary nodule.  10. History of hypertension.   MEDICATIONS:  1.  Prevacid 30 mg daily.  2.  Aspirin 81 mg daily.  3.  Depakote 250 mg daily.  4.  Digoxin 250 mcg daily.  5.  Cardizem PR 240 mg daily.  6.  Relafen 500 mg 1-2 tablets t.i.d. p.r.n.  7.  Metoprolol 100 mg b.i.d..  8.  Coumadin 5 mg  on Monday, Wednesday, and Friday and 7.5 mg on Tuesday,      Thursday, Saturday, and Sunday.   SOCIAL HISTORY:  Patient is a former smoker.  Widowed with 9 kids.   FAMILY HISTORY:  Mother died of asthma.  Father died of old age.   PHYSICAL EXAMINATION:  VITAL SIGNS:  Temperature is 98, blood pressure is  11/70, pulse is 79, respiration rate is 29, pulse ox is 100% on room air,  weight is 201.7.  GENERAL:  Normal.  EYES:  Pupils equal and reactive.  Extraocular movements intact.  ENT:  Normal.  NECK:  Supple.  No JVD.  RESPIRATORY:  Clear to auscultation bilaterally.  CVS:  Regular rate and rhythm.  S1, S2 present.  GI:  Bowel sounds present.  Nontender, nondistended.  EXTREMITIES:  No  edema.  NEURO:  Cranial nerves II-XII are intact.  Motor and strength intact.  PSYCHIATRIC:  Appropriate.   ADMISSION LABS:  Sodium of 139, potassium of 6.1, chloride of 106, bicarb of  26, BUN of 24, creatinine of 1.6, and a glucose of 98.  EKG:  Base rhythm 75  per minute, no peaked T-waves.  INR was 1.8.   IMPRESSION:  1.  Hyperkalemia.  His initial potassium was 6.1.  He did not have any      evidence of metabolic acidosis, hypoglycemia, or hypo-osmolality,      although he is on a beta blocker, it is selected beta blocker and should      not elevate the potassium.  His Digoxin was 1.2.  This was most likely      do to the use of NSAIDSs or ACE inhibitors which he was not supposed to      be on.  On a repeat examination of his basic metabolic panel his sodium      was 140, potassium was 4.8, chloride was 11, CO2 was 24, glucose was      14 0, BUN was 20, creatinine was 1.3, and calcium was 8.8, indicating      that the potassium and creatinine had gone back to normal.  The patient      is being discharged today.  He will come in the afternoon to the clinic      to see me, where his medication, his pill boxes will be checked to make      sure that he is not on the ACE inhibitors.  He did not have any tall T-      waves on his EKG.  2.  Renal insufficiency.  His creatinine on admission was 1.6, but on repeat      evaluation it was found to be 1.3.  it is not known if the patient was      dehydrated or if there was a lab error, but he was given IV fluids 150  mL per hour and the follow up creatinine in the morning was 1.3 with a      BUN of 20.  His orthostatics were checked at the time of admission which      were within normal limits.  His Lithium level during this admission was      found to be less than 0.25, indicating that he was not taking the      Lithium and was less likely to be responsible for the renal      insufficiency. 3.  History of atrial flutter with RVR.  He has  currently a normal sinus      rhythm with rates in the 70s.  We will continue his prior medications of      Cardizem and Metoprolol.  4.  History of DVT and portal vein thrombosis.  His INR was 1.8.  he was      recently changed to Coumadin 5 mg  on Monday, Wednesday, and Friday and      7.5 mg on Tuesday, Thursday, Saturday, and Sunday.  According to the      pharmacist these dosages will need to be continued  until he will be      followed up at the Coumadin clinic on next Monday.  5.  Chronic cluster headaches.  He will continue his Depakote and he has      been headache free during this admission.  6.  Questionable urinary tract infection.  The patient did complain of      burning urination, dysuria but no fever.  A urine culture has been sent      and will need to be checked during his next visit to make sure that he      does not have a urinary tract infection.  If he does he needs to be      treated with antibiotics, but because he is on Coumadin,if he is put on      quinolones or any other antibiotics that interact  with Coumadin, his      INR needs to be checked more frequently.   DISCHARGE LABS:  At the time of discharge his potassium was 4.8 and his  creatinine was 1.3.      Ronda Fairly, M.D.  Electronically Signed     YB/MEDQ  D:  11/07/2005  T:  11/07/2005  Job:  [id number]   cc:   Coralie Carpen, M.D.  Fax: [phone]

## 2010-10-05 NOTE — Discharge Summary (Signed)
NAME:  [name], [name]                          ACCOUNT NO.:  [id number]   MEDICAL RECORD NO.:  [id number]                   PATIENT TYPE:  IPS   LOCATION:  0500                                 FACILITY:  [facility]   PHYSICIAN:  Geoffery Lyons, M.D.                   DATE OF BIRTH:  08/26/1945   DATE OF ADMISSION:  09/17/2002  DATE OF DISCHARGE:  09/21/2002                                 DISCHARGE SUMMARY   CHIEF COMPLAINT AND PRESENT ILLNESS:  This was the first admission to [facility] for this patient who was thought to be suffering from  depression.  Multiple stressors, medical and social disorder and depressed  mood, appetite, energy, concentration and anhedonia and previous suicidal  ideation.  Apparently, he disclosed that his former wife had died in  05/08/02 of cancer.  He lived with this woman for 20 years.  He  established a new significant relationship in 1999 but this woman left him  in June of 2003.  Had homicidal ideation toward the former girlfriend and  her new boyfriend but denies any active ideas upon this evaluation.  He was  admitted to the medical unit due to pain in his left hip, underwent surgery  due to aseptic necrosis requiring hemiarthroplasty.   PAST PSYCHIATRIC HISTORY:  No previous formal treatment.   ALCOHOL/DRUG HISTORY:  Positive history of alcoholic gastritis.  He quit  drinking in 2000.  Acknowledged occasional use of marijuana.   PAST MEDICAL HISTORY:  Hypertension, coronary artery disease,  gastroesophageal reflux, COPD.   MEDICATIONS:  1. Vioxx _____ daily.  2. Prevacid 30 mg daily.  3. Digoxin 0.125 mg daily.  4. Lisinopril 25 mg daily.  5. Metoprolol 25 mg one-half twice a day.   PHYSICAL EXAMINATION:  Performed and failed to show any acute findings.   MENTAL STATUS EXAMINATION:  Alert, cooperative male.  Presents consistent  with his postoperative status.  Using a walker and a cane.  Has lost some  weight.  Neatly  groomed.  Attentive.  Speech is normal in rate, rhythm and  tone.  No psychotic content.  Mood is within normal limits.  Denied any  suicidal or homicidal ideation.  Requested to go home to see his  grandchildren.  Denies any active pathology.   ADMISSION DIAGNOSES:   AXIS I:  Major depression.   AXIS II:  No diagnosis.   AXIS III:  1. Hypertension.  2. Coronary artery disease.  3. Gastroesophageal reflux.  4. Chronic obstructive pulmonary disease.  5. 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  6. Posthemorrhagic anemia.   AXIS IV:  Moderate.   AXIS V:  Global Assessment of Functioning upon admission 30-35; Global  Assessment of Functioning in the last year 60-65.   LABORATORY DATA:  Upon transfer from the medical unit were within normal  limits.   HOSPITAL COURSE:  He was admitted and started intensive individual and group  psychotherapy.  He was maintained on the same medication that he was  discharged on from the medical unit:  Cipro 250 mg every 12 hours, Prevacid  30 mg daily, Zestril 20 mg daily, Lenoxin 0.125 mg daily, Lopressor 12.5 mg  twice a day, Ventolin inhaler, Atrovent, Colace and Ambien. He was started  on Zoloft 25 mg per day but he had a difficult reaction to it, so we tried  Effexor 37.5 mg per day.  On Sep 20, 2002, he was in full contact with  reality.  He was positive about the situation at home.  The family was  supportive.  He was looking forward to living and being able to resume his  life.  He felt that he had dealt with the losses.  His mood had improved.  His affect was brighter.  He did tolerate the Effexor.  There was no  homicidal ideation, no suicidal ideation.  He was wanting to go on with his  life, ready to go.  So on Sep 21, 2002, we discharged to outpatient follow-  up.   DISCHARGE DIAGNOSES:   AXIS I:  Major depression, single episode.   AXIS II:  No diagnosis.   AXIS III:  1. Hypertension.  2. Coronary artery disease.   3. Gastroesophageal reflux.  4. Chronic obstructive pulmonary disease.  5. Aseptic necrosis.   AXIS IV:  Moderate.   AXIS V:  Global Assessment of Functioning upon discharge 50.   DISCHARGE MEDICATIONS:  Effexor XR 37.5 mg daily.   FOLLOW UP:  [facility].                                               Geoffery Lyons, M.D.    IL/MEDQ  D:  10/20/2002  T:  10/21/2002  Job:  [id number]

## 2010-10-05 NOTE — H&P (Signed)
NAME:  [name], [name] NO.:  [id number]   MEDICAL RECORD NO.:  [id number]          PATIENT TYPE:  INP   LOCATION:  1428                         FACILITY:  [facility]   PHYSICIAN:  Deidre Ala, M.D.    DATE OF BIRTH:  07-Feb-1946   DATE OF ADMISSION:  03/08/2008  DATE OF DISCHARGE:  03/18/2008                              HISTORY & PHYSICAL   CHIEF COMPLAINT:  Right hip pain, chronic.   HISTORY:  This is a 64 year old African American male who is followed by  Dr. Renae Fickle who comes in.  We continue to follow him for right hip pain.  Workup was completed.  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  He continued to have chronic pain and was wanting  to undergo surgery for his replacement or alleviation of his pain.  This  is subsequently being scheduled.   PAST MEDICAL HISTORY/REVIEW OF SYSTEMS:  Patient denies having history  of smoking.  He has GERD.  He had pacemaker implant in 2004 for  bradycardia.  He has history of CHF and dilated cardiomyopathy.   PRESENT MEDICATIONS:  He takes:  1. Prevacid 30 mg a day.  2. Aspirin 81 mg a day.  3. Megace 40 mg p.o. b.i.d.  4. Digoxin 0.125 mg a day.  5. MS Contin 60 mg XR 2 times a day.  6. Percocet 10 mg every 6 hours p.r.n.  7. Advair Diskus 250/50 mcg twice a day.  8. He takes diltiazem 90 mg 2 tablets twice a day.  9. Coreg 6.25 mg twice a day.  10.Coumadin 5 mg as directed.  11.Lasix 40 mg on Monday, Wednesday, and Friday.  12.ProAir 1 to 2 puffs every 6 hours p.r.n.  13.Promethazine 25 mg every 6 hours p.r.n.  14.Viagra 50 mg on occasion.   HE HAS NO KNOWN DRUG ALLERGIES.   OBJECTIVE:  On exam, this is a well-developed, well-nourished, 62-year-  old [place] American male, alert, oriented, and cooperative.  Patient is  currently afebrile.  VITAL SIGNS:  Stable.  HEENT:  [place], AT, EOMI, PERRL.  Oropharynx clear.  NECK:  Supple without JVD, lymphadenopathy, or thyromegaly.  No carotid  bruits are noted.  Trachea is midline.  CHEST:  Symmetrical inspiration.  Clear to auscultation.  No wheezing,  rhonchi, or rales noted.  CVS:  Regular rate and rhythm.  Currently, no murmur, rub, or gallop  noted.  ABDOMEN:  Soft.  Bowel sounds present.  No palpable  masses.  NO HSM.  No hernias.  GU:  Deferred.  RECTAL:  Deferred.  EXTREMITIES:  Without clubbing, cyanosis, or edema.  Tender to palpation  to the right hip.  There are no skin color changes.  Peripheral pulses  intact.  NEUR:  CN II-XII grossly intact without focal deficits.   IMPRESSION:  1. 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  2. History of cardiomyopathy.  3. Hypertension.  4. History of tobacco use.  5. History of atrial fibrillation.   PLAN:  Patient is going to be  admitted to [facility] and  undergo total right hip arthroplasty.      Phineas Semen, Wynonia Hazard, M.D.  Electronically Signed    CL/MEDQ  D:  04/05/2008  T:  04/05/2008  Job:  [id number]

## 2010-10-05 NOTE — Discharge Summary (Signed)
NAME:  [name], [name] NO.:  [id number]   MEDICAL RECORD NO.:  [id number]                   PATIENT TYPE:  INP   LOCATION:  3715                                 FACILITY:  [facility]   PHYSICIAN:  Fransisco Hertz, M.D.               DATE OF BIRTH:  08/26/1945   DATE OF ADMISSION:  02/22/2003  DATE OF DISCHARGE:  02/26/2003                                 DISCHARGE SUMMARY   PRIMARY CARE PHYSICIAN:  Lonia Blood, M.D.   CONSULTING PHYSICIAN:  [name], M.D., Aram Candela. Tysinger, M.D.   DISCHARGE DIAGNOSES:  1. Tachy-brady syndrome with secondary syncopal episode.  2. Chronic anticoagulation due to history of portal and superior mesenteric     vein thrombosis.  3. Hypertension.  4. History of avascular necrosis of the hip.  5. Depression.  6. History of alcohol abuse.  7. Gastritis.  8. Mild chronic obstructive pulmonary disease.  9. History of SVT in April of 2002.  10.      Atrial flutter, July 2003.  11.      Necrosis of the hip.   DISCHARGE MEDICATIONS:  1. Lanoxin 0.125 mg p.o. daily.  2. Lisinopril 20 mg p.o. daily.  3. Coumadin 10 mg p.o. daily four days a week.  4. Coumadin 7.5 mg p.o. daily three days a week.  5. Effexor 75 mg p.o. daily.  6. Vioxx 12.5 mg p.o. daily.  7. Famotidine 40 mg p.o. daily.  8. HCTZ 12.5 mg p.o. daily.  9. Lorazepam 0.5 mg p.o. b.i.d. p.r.n. anxiety.  10.      Percocet 5/325 one tab p.o. q.6 p.r.n. pain.  11.      Antivert 12.5 mg p.o. b.i.d. p.r.n. vertigo.   CONDITION ON DISCHARGE:  Improved.   FOLLOW UP:  Mr. Skoog is supposed to follow up with:  1. Call Dr. Aleen Campi at [phone] to make an appointment in one week.  2. He has an appointment with Dr. Lonia Blood on October 25, Monday, at 1:45     p.m.  3. The patient has already scheduled a follow-up with Dr. Erline Levine in the     Coumadin clinic on October 11 at 2 p.m.   WOUND CARE:  Mr. Virani was instructed by Dr. Aleen Campi and his staff on  wound  care for the pacemaker placement.  He is supposed to keep his wound  dry for one week.  The patient also has a prior appointment.   PROCEDURES:  Mr. Sibley had a 2-D heart echo on October 7 and had on October  9 placement of a ventricular pacemaker by Dr. Aleen Campi with cardiology.   CONSULTATIONS:  1. As mentioned, Dr. Orpah Cobb with cardiology.  2. Dr. Charolette Child with cardiology.   CHIEF COMPLAINT:  Syncope.   HISTORY OF PRESENT ILLNESS:  This is a 65 year old with a history of portal  and mesenteric vein  thrombosis, avascular necrosis of the hip, history of  alcohol abuse, and recent history of SVT in April 2002, atrial flutter in  July 2003, with hospitalizations, who presents with a syncopal episode two  days prior to date of admission.  Mr. Hosking reports getting up from a  seated position when he became weak and light-headed.  He then says  everything went black.  His wife who was watching this episode, reports that  he started shaking and that the episode lasted for 2-3 seconds.  They both  denied that he fell down at this time.  He reports that this dizziness has  been going on for 3-4 months on a daily basis.  He describes it as a loss of  balance and that his world is spinning.  The reason why he had reported to  the hospital was because he thought he almost blacked out.  Mr. Kabat also  reports feeling that his heart has beat faster for 5-6 months now.  It  happens for 4-5 minutes at a time about every other day.   ALLERGIES:  Mr. Fulco reports a PENICILLIN allergy.   PAST MEDICAL HISTORY:  1. Portal superior mesenteric vein thrombosis.  2. History of alcohol abuse.  3. Gastritis.  4. Hypertension.  5. Bilateral AVN.  6. Mild COPD.  7. History of right pulmonary nodule.  8. History of left heart catheterization with ejection fraction of 35%.  9. Recent hospitalization in May 2004 for major depressive disorder.  10.      History of SVT.  11.      History of  atrial flutter.   PHYSICAL EXAMINATION:  VITAL SIGNS:  Pulse 89, blood pressure 123/78,  temperature 97.1, respiratory rate 18.  Orthostatics include lying down, a  blood pressure of 120/77 with pulse of 55; sitting up, blood pressure  123/74, pulse 55; standing up, blood pressure 116/78, pulse 62.  GENERAL:  This is an African-American male in no apparent distress.  HEENT:  Extraocular movements intact.  Pupils equal, round, and reactive to  light and accommodation.  ENT exam:  Mucous membranes moist.  Dentition  poor.  NECK:  Supple.  No lymphadenopathy.  RESPIRATORY:  Bilateral clear to auscultation.  CARDIOVASCULAR:  Bradycardic.  Regular rhythm.  No murmurs present.  GASTROINTESTINAL:  Abdomen soft, nontender.  Bowel sounds present.  EXTREMITIES:  Rate on posterior tibial pulses 2+.  No edema present.  NEUROLOGIC:  Cranial nerves II-XII intact.  Strength 5/5, symmetric.   LABORATORY DATA:  Labs upon admission are as follows:  BMET:  Sodium 140,  potassium 4.5, chloride 104, bicarb 30, BUN 25, creatinine 1.1, glucose 119.  CBC:  White blood cell count 6.6, hemoglobin 14.2, hematocrit 42.5,  platelets 143.  Cardiac enzymes:  Set #1:  CK 248, MB 1.7, troponins less  than 0.01.  PT 18.2, INR 1.8, PTT 33.   EKG showed a rate of 49, sinus bradycardia, axis normal, PR interval 0.146,  QRS 0.09.  No Q's present.  Questionable ST changes in V2, V3.   HOSPITAL COURSE:  1. SYNCOPE:  Mr. Salomon was admitted to the cardiology unit and placed on     telemetry.  A cardiac panel was ordered and plans were made to consult     his cardiologist, Dr. Algie Coffer.  After further evaluation of his syncopal     episode, it was decided that it was not exactly syncope because the     patient did not pass out and did  not fall on the ground.  The episode     lasted 3-4 seconds and was more compatible with a gray-out.  With his    strong history of SVT and atrial flutter with hospitalizations for these      conditions, it seemed most likely that this episode was the result of a     tachy-brady syndrome.  Dr. Algie Coffer was consulted and he agreed with this.     An MI was ruled out with the EKG and negative cardiac enzymes.  Mr.     Lovingood's digoxin level was also checked which was low at less than 0.2.     Upon questioning Mr. Zuercher he reports to running out of his digoxin and     not taking it for the past couple days.  Dr. Algie Coffer scheduled pacemaker     placement with Dr. Charolette Child which was done on October 9.  Mr. Kriesel     did well after the procedure and additionally was doing well on day of     discharge.  Upon discharge he was told to resume his Lanoxin, lisinopril,     and his HCTZ.  He was also given a few days' supply of Percocet for the     pain associated with pacemaker placement.   1. CHRONIC ANTICOAGULATION:  The patient's PT and PTT were already mentioned     before.  He was on a home dose of Coumadin 10 and 7.5 alternating.  The     patient was kept on these doses during his hospital stay except when it     was removed for his procedure.  He was discharged on those doses and     would follow up with Dr. Erline Levine at his prior scheduled appointment on     October 11 at 2 p.m.   1. DEPRESSION:  During his hospital stay Mr. Allsup was kept on his Effexor.   1. HYPERTENSION:  Mr. Neis was placed on his lisinopril during this     hospitalization.  We held his HCTZ during workup for syncope.   1. GERD:  The patient was kept on Protonix during hospitalization and     discharged on his home dose of famotidine.    LABORATORY DATA:  Discharge labs are as follows:  CBC:  White blood cell  count 6.5, hemoglobin 14.3, hematocrit 41.9, platelets 128.  BMET as  follows:  Sodium 142, potassium 3.8, chloride 109, bicarb 27, BUN 11,  creatinine 1, glucose 98, calcium 8.9.          Riley Kill, M.D.    WS/MEDQ  D:  02/27/2003  T:   02/27/2003  Job:  [id number]   cc:   Outpatient Clinic   [name], M.D.  [address]  [place] [address]   Lonia Blood, M.D.  [address] [place], [place] [address]  Fax: [phone]

## 2010-10-05 NOTE — Discharge Summary (Signed)
NAME:  [name], [name]                          ACCOUNT NO.:  [id number]   MEDICAL RECORD NO.:  [id number]                   PATIENT TYPE:  INP   LOCATION:  3735                                 FACILITY:  [facility]   PHYSICIAN:  Lorne Skeens, M.D.                   DATE OF BIRTH:  08/26/1945   DATE OF ADMISSION:  03/09/2002  DATE OF DISCHARGE:  03/11/2002                                 DISCHARGE SUMMARY   DISCHARGE DIAGNOSES:  1. Noncardiac chest pain.  2. Left ventricular hypokinesis.  3. Chronic obstructive pulmonary disease.  4. Gastritis.  5. History of portal vein thrombosis, deep vein thrombosis, and superior     mesenteric vein thrombosis on chronic anticoagulation.  6. History of alcohol abuse.  7. History of tobacco and  marijuana use.   DISCHARGE MEDICATIONS:  1. Coumadin 10 mg four days a week and 7.5 mg three days a week.  2. Vioxx 25 mg one p.o. q.d.  3. Prevacid 30 mg one p.o. q.d.  4. Digoxin 0.125 mg one p.o. q.d.  5. Lisinopril 20 mg one p.o. q.d.  6. Metoprolol 50 mg and 1/2 p.o. b.i.d.  7. Nitrostat 0.4 mg one sublingually every five minutes p.r.n., maximum     three.  8. Lovenox 130 mg subcutaneous injection q.d. for seven days.   DISCHARGE INSTRUCTIONS:  He is to use his Vioxx for arthritis pain.  Explained what cardiac pain normally feels like and if he does have chest  pain with shortness of breath he is to take nitroglycerin.  For any  additional pains, he can take Tylenol every four to six hours as needed.  His diet should be a low fat, low cholesterol diet.  Wound care to the right  groin where cath site was.  He is to keep it clean and dry.   FOLLOW UP:  In the outpatient clinic on Thursday 10/30 at 3:45 with Dr.  Lavera Guise.  At this time, he needs to get a PT, INR drawn.   HOSPITAL COURSE:  The patient is a 65 year old African-American male who  presented to the outpatient clinic with a history of hypertension, atrial  flutter and COPD that was having  substernal chest pain occurring at  increased frequency over the last month.  He had been seen in the emergency  department on 02/28/02 for chest pain and left AMA.  He was then seen in the  outpatient clinic the following day  where he also refused to be admitted  secondary to financial issues.  He does see Dr. Algie Coffer as an outpatient who  recommended cardiac catheterization  as an outpatient in the past, but  patient had refused this.  At this time, he presents with chest pain which  is substernal in nature that began day prior to admission and lasted 10 to  15 minutes.  Was brought on by exercise and relieved  spontaneously with  rest.  He has had about two to three episodes per week.  He denied any  radiation of the pain.  He was also having some shortness of breath  on  exertion over the past month and reports that he limits his amount of  exertion due to this chest pain and shortness of breath.  He denies any  cough, any fevers or chills and denies any current wheezing.   His vital signs on admission showed a temperature of 98.2, pulse 67,  respirations 20, blood pressure  127/70.  O2 saturation 98% on room air.  Pertinent findings on physical exam include trace edema in the lower  extremities.  Heart was regular rate and rhythm without murmurs.  Lungs were  clear to auscultation bilaterally without wheezing, rales or rhonchi.  The  patient  had no reproducible chest pain to palpation and no increased chest  pain with deep breathing.   #1  -  CHEST PAIN.  Rule out MI.  Initially, it was thought at presentation  that this was unstable angina  as the patient had increased frequency of his  chest pain.  He was admitted to a telemetry bed and his tilt telemetry  strips and initial EKG both showed T-wave inversion in the inferior leads.  He initially was sinus bradycardic in the 40s.  He was then placed on  oxygen, a heparin drip, beta blocker and nitroglycerin p.r.n.  A repeat EKG   showed also T-wave inversion in the inferior leads. His cardiologist, Dr.  Algie Coffer was consulted on 10/22. This after three sets of cardiac enzymes  came back negative.  Dr. Algie Coffer planned to do a cath on 10/23. Due to  patient's INR being 1.8, he was given one dose of vitamin K 10 mg p.o. x1 in  order to perform the cath on the following day.  His initial chest x-ray  showed left lower lobe scarring, left upper lobe blebs and hyperexpansion on  the right consistent with emphysema. A TSH was drawn to rule out any thyroid  abnormalities resulting in chest pain.  His TSH was normal at 0.792.  A  fasting lipid panel was done to rule out an additional cardiac risk factor.  His total cholesterol was 91.  HDL 40, LDL 43, triglycerides 40.   His cath report from 10/23 showed normal coronary arteries with general  hypokinesia of the left ventricular wall with mild mitral valve  regurgitation and an ejection fraction of 35%.  It was concluded that his  chest pain was noncardiac.  The patient is discharged home on an ACE  inhibitor, beta blocker and Nitrostat p.r.n.   #2 -  HISTORY OF PORTAL VEIN THROMBOSIS, SUPERIOR MESENTERIC VEIN THROMBOSIS  AND DEEP VENOUS THROMBOSIS ON CHRONIC COUMADIN ANTICOAGULATION.  At  presentation, PT was 19.5, INR 1.8.  As said prior, he was given one dose of  vitamin K for his cardiac catheterization.  INR dropped to 1.1 after cath.  On day of discharge, the patient  was placed back on his Coumadin and was  started on Lovenox.  He received a Lovenox teaching kit and will use Lovenox  at home for seven days until therapeutic on his Coumadin.  At his followup  appointment on 10/30, he is to get a PT, INR drawn to make sure he is  therapeutic.   #3 - CHRONIC OBSTRUCTIVE PULMONARY DISEASE.  On chest x-ray he had  noticeable blebs and hyperexpansion.  He required no breathing  treatments during his hospital stay.  His lungs were clear to auscultation.  He had  good  inspiratory effort and no increased work of breathing.   #4 -  HISTORY OF SUPRAVENTRICULAR TACHYCARDIA/ATRIAL FLUTTER DURING HIS LAST  HOSPITALIZATION IN 11/2001.  The patient  was chemically cardioverted back  into normal sinus rhythm. He remained in normal sinus rhythm during his  hospital stay.  His previous echo from 8/03 showed an ejection fraction of  40 to 50% with mild hypokinesis of the  septal wall.  The current cardiac  catheterization  that was done was pretty similar showing an ejection  fraction slightly less at 35% and he does have general hypokinesis of the  left ventricular wall.   #5 -  SINUS BRADYCARDIA.  During admission heart rate went down into the 40s  and remained in the 40s during the first two hospital days.  It was noted  that patient came in on atenolol 25 mg q.d.  He was placed on metoprolol and  reduced in dose to 25 mg b.i.d.  The patient tolerated this dose well and  heart rate improved to the 60s.   #6 -  GASTRITIS.  The patient was placed on gastrointestinal prophylaxis  with Protonix and at discharge he is back on his Prevacid and encouraged to  use.  I am unsure at this point is his previous episodes of chest pain have  been related to gastritis or gastroesophageal reflux disease.  The patient  is encouraged to use his Prevacid at home.   #7 - LEFT VENTRICULAR WALL HYPOKINESIS AS SEEN ON CARDIAC CATHETERIZATION  AND PREVIOUS ECHOCARDIOGRAM.  The patient came in on digoxin 0.125 mg q.d.  He will remain on this dose at discharge.  A dig level should be checked at  followup appointment. There are no pending results at this time.   DISPOSITION:  Suggested followup items include compliance of medications as  financial issues are a main part of his noncompliance with medications.                                               Lorne Skeens, M.D.    Erick Alley  D:  03/12/2002  T:  03/14/2002  Job:  [id number]   cc:   Lonia Blood, M.D.  [address] [place], [place] [address]  Fax: [phone]   [name], M.D.  [address]  [place] [address]

## 2010-10-05 NOTE — Cardiovascular Report (Signed)
   NAME:  [name], [name]                          ACCOUNT NO.:  [id number]   MEDICAL RECORD NO.:  [id number]                   PATIENT TYPE:  INP   LOCATION:  3735                                 FACILITY:  [facility]   PHYSICIAN:  [name], M.D.               DATE OF BIRTH:  08/26/1945   DATE OF PROCEDURE:  03/11/2002  DATE OF DISCHARGE:                              CARDIAC CATHETERIZATION   PROCEDURE PERFORMED:  Left heart catheterization, selective coronary  angiography, left ventricular function study.   INDICATION:  This 65 year old black male had recurrent chest pain along with  cardiac risk factors of smoking for 35 years and history of drug abuse.   APPROACH:  Right femoral artery using 6 French diagnostic catheters.   COMPLICATIONS:  None.   A Perclose suture was applied at the end of the procedure successfully.   HEMODYNAMIC DATA:  The left ventricular pressure was 170/4/16 and aortic  pressure was 172/75/112.   CORONARY ANATOMY:  Left main coronary artery:  The left main coronary artery  was unremarkable.   Left anterior descending coronary artery:  The left anterior descending  coronary artery was apparently unremarkable with a gradual distal narrowing.   Diagonal vessels were also unremarkable.   Left circumflex coronary artery:  The left circumflex coronary artery was  essentially unremarkable.  It obtuse marginal branch was also unremarkable.   Right coronary artery: The right coronary artery was dominant and with  normal posterior descending coronary artery.   LEFT VENTRICULOGRAM:  The left ventriculogram showed a generalized  hypokinesia with ejection fraction of 35%.  There was also mild mitral  regurgitation.   IMPRESSION:  1. Normal coronaries.  2. Moderate left ventricular systolic dysfunction.  3.     Mild mitral regurgitation.  4. Noncardiac chest pain.   RECOMMENDATIONS:  This patient will have noncardiac chest pain evaluation  and medical  therapy.                                                     [name], M.D.    ASK/MEDQ  D:  03/11/2002  T:  03/11/2002  Job:  [id number]

## 2010-10-09 ENCOUNTER — Encounter: Payer: Self-pay | Admitting: Internal Medicine

## 2010-10-16 ENCOUNTER — Other Ambulatory Visit: Payer: Self-pay | Admitting: *Deleted

## 2010-10-17 MED ORDER — OXYCODONE-ACETAMINOPHEN 10-650 MG PO TABS: 1.0000 | ORAL_TABLET | Freq: Four times a day (QID) | ORAL | Status: DC | PRN

## 2010-10-17 NOTE — Telephone Encounter (Signed)
Prescription printed and signed-nurse to complete.

## 2010-10-17 NOTE — Telephone Encounter (Signed)
Pt.notified

## 2010-10-28 ENCOUNTER — Other Ambulatory Visit (INDEPENDENT_AMBULATORY_CARE_PROVIDER_SITE_OTHER): Payer: Medicare Other | Admitting: Internal Medicine

## 2010-10-28 DIAGNOSIS — I4891 Unspecified atrial fibrillation: Secondary | ICD-10-CM

## 2010-10-29 ENCOUNTER — Ambulatory Visit (AMBULATORY_SURGERY_CENTER): Payer: Medicare Other | Admitting: *Deleted

## 2010-10-29 VITALS — Ht 75.0 in | Wt 161.4 lb

## 2010-10-29 DIAGNOSIS — R933 Abnormal findings on diagnostic imaging of other parts of digestive tract: Secondary | ICD-10-CM

## 2010-10-29 MED ORDER — PEG-KCL-NACL-NASULF-NA ASC-C 100 G PO SOLR: ORAL | Status: DC

## 2010-10-30 ENCOUNTER — Telehealth: Payer: Self-pay | Admitting: Internal Medicine

## 2010-10-30 NOTE — Telephone Encounter (Signed)
Called pt and no answer.Left message we will try calling later. [name]

## 2010-10-30 NOTE — Telephone Encounter (Signed)
I spoke with the patient and informed him to come to our office and we would have a sample of the Moviprep to use. Pt understood and will be here in the morning. He will call back if he has further questions.[name]

## 2010-11-06 ENCOUNTER — Ambulatory Visit (AMBULATORY_SURGERY_CENTER): Payer: Medicare Other | Admitting: Internal Medicine

## 2010-11-06 ENCOUNTER — Encounter: Payer: Self-pay | Admitting: Internal Medicine

## 2010-11-06 VITALS — BP 114/72 | HR 63 | Temp 97.8°F | Resp 11 | Ht 75.0 in | Wt 162.0 lb

## 2010-11-06 DIAGNOSIS — R933 Abnormal findings on diagnostic imaging of other parts of digestive tract: Secondary | ICD-10-CM

## 2010-11-06 DIAGNOSIS — D689 Coagulation defect, unspecified: Secondary | ICD-10-CM

## 2010-11-06 MED ORDER — SODIUM CHLORIDE 0.9 % IV SOLN: 500.0000 mL | INTRAVENOUS | Status: DC

## 2010-11-06 NOTE — Progress Notes (Signed)
Per pt his last dose of coumadin was 10/31/10.  Dr. [name] was made aware of this. MAW  Brennan Bailey, CRNA administered propofol to the pt.  MAW

## 2010-11-06 NOTE — Patient Instructions (Signed)
Please review discharge instructions  RESUME COUMADIN TODAY!!

## 2010-11-07 ENCOUNTER — Telehealth: Payer: Self-pay

## 2010-11-07 NOTE — Telephone Encounter (Signed)

## 2010-11-08 IMAGING — CR DG CHEST 2V
2 series · 2 of 2 positions shown · non-contrast
Comparison: 12/23/2008

CLINICAL DATA: Loss of appetite.  Weight loss.  Short of breath.
Cough.

CHEST - 2 VIEW

[w chest pa]
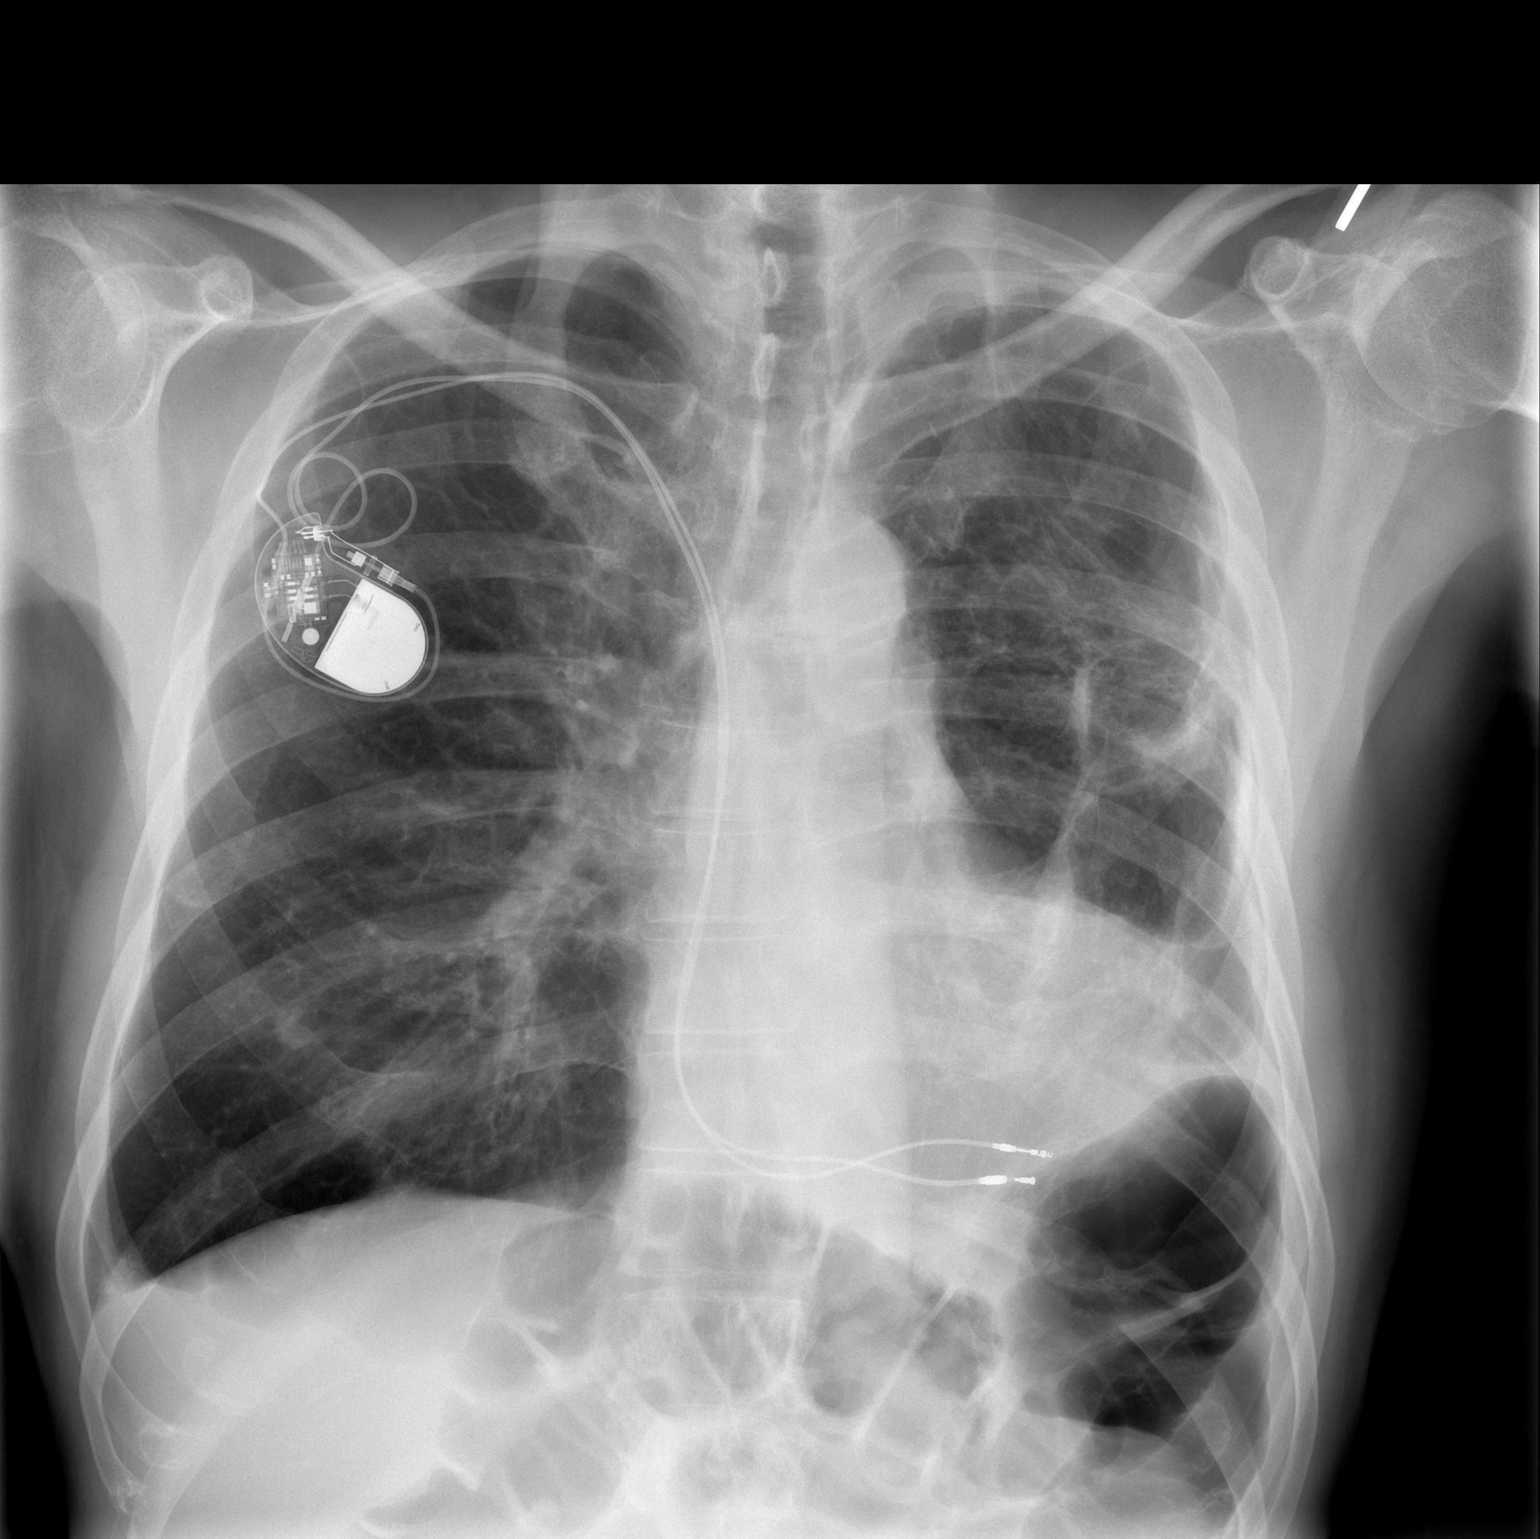

[w chest lat]
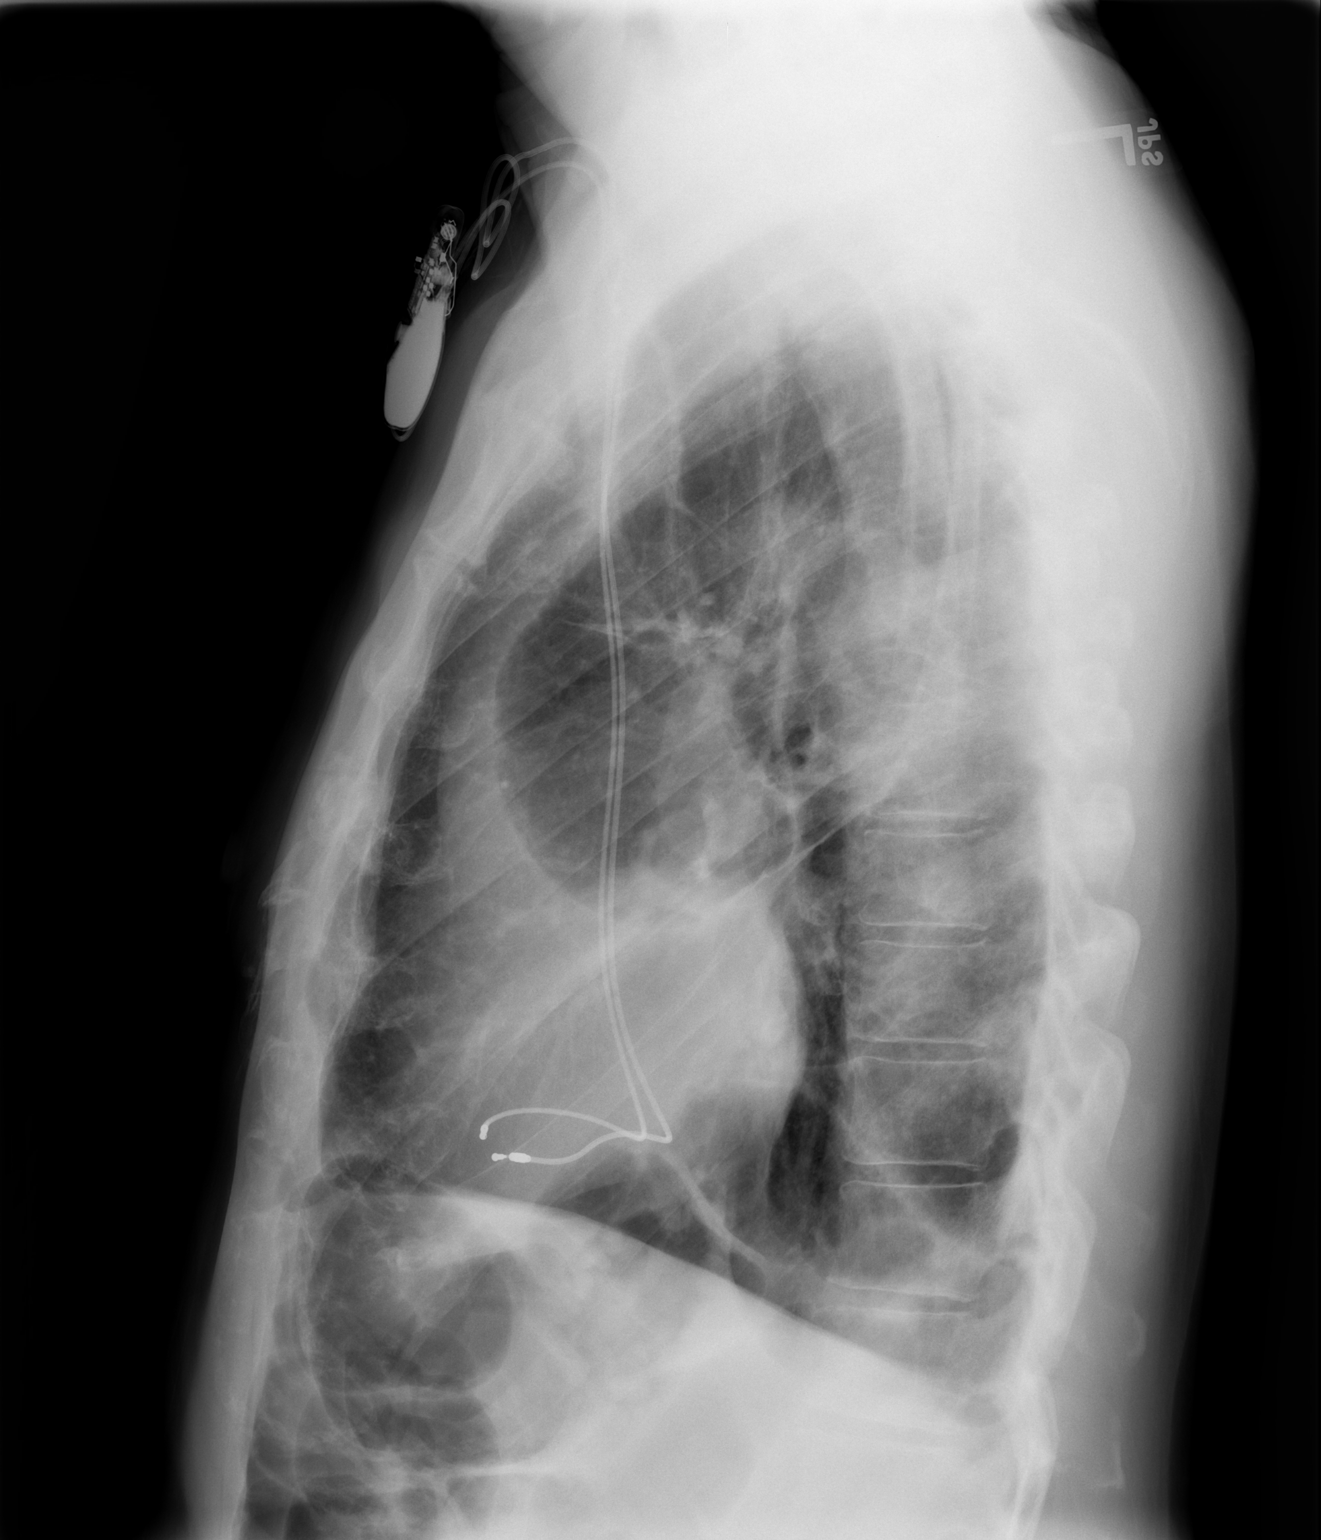

[2 of 2 positions shown; findings below may reference images not displayed]

FINDINGS: Dual lead pacemaker remains in place with both leads in
the expected region of the right ventricle.  The heart is not
enlarged.  Mediastinal shadows are unremarkable.  There is chronic
lung disease with pulmonary hyperinflation and scarring.  Pleural
and parenchymal scarring is worse on the left than the right.
Infiltrate in the left lower lungs seen last year has resolved.
Bilateral nipple shadows are evident.  No active infiltrate or
effusion is identifiable.  No significant bony finding.
IMPRESSION: Pacemaker appears unchanged.

Severe chronic lung disease with hyperinflation and extensive
pleural parenchymal scarring, more on the left than the right.  No
active process is evident.

## 2010-11-12 ENCOUNTER — Other Ambulatory Visit: Payer: Self-pay | Admitting: *Deleted

## 2010-11-12 DIAGNOSIS — M549 Dorsalgia, unspecified: Secondary | ICD-10-CM

## 2010-11-12 DIAGNOSIS — M1990 Unspecified osteoarthritis, unspecified site: Secondary | ICD-10-CM

## 2010-11-12 DIAGNOSIS — G8929 Other chronic pain: Secondary | ICD-10-CM

## 2010-11-13 MED ORDER — OXYCODONE-ACETAMINOPHEN 10-650 MG PO TABS: 1.0000 | ORAL_TABLET | Freq: Four times a day (QID) | ORAL | Status: DC | PRN

## 2010-11-13 NOTE — Telephone Encounter (Signed)
Refill printed and signed - nurse to complete. 

## 2010-11-16 ENCOUNTER — Telehealth: Payer: Self-pay | Admitting: Internal Medicine

## 2010-11-16 MED ORDER — TRAMADOL HCL 50 MG PO TABS: 50.0000 mg | ORAL_TABLET | Freq: Four times a day (QID) | ORAL | Status: DC | PRN

## 2010-11-16 NOTE — Telephone Encounter (Signed)
Patient called on the emergency pager complaining about hip pain. Patient said that he usually takes oxycodone and was supposed to pick up his prescription this morning. He was complaining that he was never told that clinic is currently off today. I advised patient to take tramadol which I can prescribe over the phone which would get him through the next 3 days until he can pick up his prescription on Monday morning the patient agreed and I will call in tramadol to [facility] at [facility].

## 2010-11-19 ENCOUNTER — Encounter: Payer: Self-pay | Admitting: Internal Medicine

## 2010-11-19 ENCOUNTER — Ambulatory Visit (INDEPENDENT_AMBULATORY_CARE_PROVIDER_SITE_OTHER): Payer: Medicare Other | Admitting: Internal Medicine

## 2010-11-19 DIAGNOSIS — F329 Major depressive disorder, single episode, unspecified: Secondary | ICD-10-CM

## 2010-11-19 DIAGNOSIS — R5381 Other malaise: Secondary | ICD-10-CM

## 2010-11-19 DIAGNOSIS — I10 Essential (primary) hypertension: Secondary | ICD-10-CM

## 2010-11-19 DIAGNOSIS — I428 Other cardiomyopathies: Secondary | ICD-10-CM

## 2010-11-19 DIAGNOSIS — G4700 Insomnia, unspecified: Secondary | ICD-10-CM

## 2010-11-19 DIAGNOSIS — E0590 Thyrotoxicosis, unspecified without thyrotoxic crisis or storm: Secondary | ICD-10-CM

## 2010-11-19 DIAGNOSIS — F32A Depression, unspecified: Secondary | ICD-10-CM

## 2010-11-19 DIAGNOSIS — I4891 Unspecified atrial fibrillation: Secondary | ICD-10-CM

## 2010-11-19 LAB — CBC WITH DIFFERENTIAL/PLATELET
Basophils Absolute: 0 10*3/uL (ref 0.0–0.1)
Basophils Relative: 0 % (ref 0–1)
HCT: 40.9 % (ref 39.0–52.0)
Lymphocytes Relative: 37 % (ref 12–46)
Neutro Abs: 3.7 10*3/uL (ref 1.7–7.7)
Neutrophils Relative %: 51 % (ref 43–77)
Platelets: 147 10*3/uL — ABNORMAL LOW (ref 150–400)
RDW: 14.3 % (ref 11.5–15.5)
WBC: 7.2 10*3/uL (ref 4.0–10.5)

## 2010-11-19 LAB — COMPLETE METABOLIC PANEL WITH GFR
AST: 15 U/L (ref 0–37)
Albumin: 3.8 g/dL (ref 3.5–5.2)
Alkaline Phosphatase: 78 U/L (ref 39–117)
BUN: 24 mg/dL — ABNORMAL HIGH (ref 6–23)
Potassium: 4.7 mEq/L (ref 3.5–5.3)
Total Bilirubin: 0.7 mg/dL (ref 0.3–1.2)

## 2010-11-19 LAB — PROTIME-INR
INR: 1.06 (ref <1.50)
Prothrombin Time: 14 seconds (ref 11.6–15.2)

## 2010-11-19 LAB — TSH: TSH: 1.579 u[IU]/mL (ref 0.350–4.500)

## 2010-11-19 LAB — T4, FREE: Free T4: 1.09 ng/dL (ref 0.80–1.80)

## 2010-11-19 MED ORDER — RAMELTEON 8 MG PO TABS: 8.0000 mg | ORAL_TABLET | Freq: Every day | ORAL | Status: DC

## 2010-11-19 NOTE — Assessment & Plan Note (Signed)
Well controlled  Will continue current meds 

## 2010-11-19 NOTE — Patient Instructions (Signed)
Please take Ramelteon 8mg  one tablet by mouth 30 minutes before bedtime. Will get labs today and I will call you with abnormal lab results Follow up with Dr. Anselm Jungling in 2 weeks

## 2010-11-19 NOTE — Assessment & Plan Note (Addendum)
After a long discussion with patient and Dr. Meredith Pel, we have decided to start patient on Ramelteon 8mg  qhs as patient has a problem with staying asleep.  There are many contraindications to Multaq(which he is on for his heart disease) and SSRIs and insomnia medications; therefore, the options for sleep aid is very limited.    -Will check CBC with diff, CMP, TSH, free T4, 12 lead EKG, PT/INR. -Will follow up with me in 2 weeks.  Addendum: EKG did not show any acute abnormalities, no prolonged QT interval

## 2010-11-19 NOTE — Assessment & Plan Note (Addendum)
Rate controlled.  Will continue Warfarin, Cardizem and Multaq. Will check PT/INR today

## 2010-11-19 NOTE — Progress Notes (Signed)
HPI:  65 yo man with a very complex medical problems presents for insomnia and anxiety x 2 weeks duration.  He states that he goes to sleep at 9:30PM and wakes up ten minutes later, and continues to wake up intermittently throughout the night.  The maximum he can sleep is about 3-4 hours per night.  He reports feeling fatigue, no energy, decrease in concentration, anxious, and decreased in appetite.  He denies feeling worthlessness, hopeless, sad, guilt, or loss of interest in activities that he normally enjoy.  Also denies any SI/HI/hallucinations.  He has tremors and repeatedly states that it is his "nerve" that is bothering him and would like some Valium.   Patient recently had a colonoscopy by Dr. [name]: negative for malignancy.  ROS: as per HPI  PE: General: alert, thin appearing, and cooperative to examination.   Lungs: normal respiratory effort, no accessory muscle use, normal breath sounds, no crackles, and no wheezes. Heart: irregularly irregular, no murmur, no gallop, and no rub.  Abdomen: soft, non-tender, normal bowel sounds, no distention, no guarding, no rebound tenderness.  Msk: no joint swelling, no joint warmth, and no redness over joints.  Pulses: 2+ DP/PT pulses bilaterally Extremities: No cyanosis, clubbing, edema Neurologic: alert & oriented X3, cranial nerves II-XII intact, strength normal in all extremities, sensation intact to light touch, and gait normal.  Psych: Oriented X3, memory intact for recent and remote, normally interactive, good eye contact, slightly anxious appearing, and not depressed appearing.

## 2010-11-19 NOTE — Assessment & Plan Note (Signed)
Patient states that Remeron works better than Prozac; therefore, will continue Remeron 15mg  qd.

## 2010-11-19 NOTE — Assessment & Plan Note (Signed)
Last TSH and free T4 levels were normal.   Will recheck TSH and free T4 today

## 2010-11-20 ENCOUNTER — Other Ambulatory Visit: Payer: Self-pay | Admitting: *Deleted

## 2010-11-20 NOTE — Telephone Encounter (Signed)
Received form from Medicare; Dr. Anselm Jungling will complete form which needs to be faxed. Pt is also awared.

## 2010-11-20 NOTE — Telephone Encounter (Signed)
Pt called and stated his insurance will not cover Ramelteon; can u order something else?  Thanks

## 2010-11-20 NOTE — Telephone Encounter (Signed)
Please ask insurance to fax authorization form for this medication because we have limited option in prescribing SSRI/sleep aid because of drug interactions with Multaq.  I discussed with Dr. Meredith Pel in detailed on 11/19/10.

## 2010-11-26 ENCOUNTER — Ambulatory Visit (INDEPENDENT_AMBULATORY_CARE_PROVIDER_SITE_OTHER): Payer: Medicare Other | Admitting: Pharmacist

## 2010-11-26 ENCOUNTER — Ambulatory Visit: Payer: Medicare Other

## 2010-11-26 DIAGNOSIS — I81 Portal vein thrombosis: Secondary | ICD-10-CM

## 2010-11-26 DIAGNOSIS — I4891 Unspecified atrial fibrillation: Secondary | ICD-10-CM

## 2010-11-26 DIAGNOSIS — Z7901 Long term (current) use of anticoagulants: Secondary | ICD-10-CM

## 2010-11-26 NOTE — Patient Instructions (Signed)
Patient instructed to take medications as defined in the Anti-coagulation Track section of this encounter.  Patient instructed to take today's dose.  Patient verbalized understanding of these instructions.    

## 2010-11-26 NOTE — Progress Notes (Signed)
Anti-Coagulation Progress Note  [name]. is a 65 y.o. male who is currently on an anti-coagulation regimen.    RECENT RESULTS: Recent results are below, the most recent result is correlated with a dose of ZERO mg. per week: Lab Results  Component Value Date   INR 0.9 11/26/2010   INR 1.06 11/19/2010   INR 3.1 09/04/2010    ANTI-COAG DOSE:   Latest dosing instructions   Total Sun Mon Tue Wed Thu Fri Sat   67.5 10 mg 10 mg 7.5 mg 10 mg 10 mg 10 mg 10 mg    (5 mg2) (5 mg2) (5 mg1.5) (5 mg2) (5 mg2) (5 mg2) (5 mg2)         ANTICOAG SUMMARY: Anticoagulation Episode Summary              Current INR goal  Next INR check 12/10/2010   INR from last check 0.9 (11/26/2010)     Weekly max dose (mg)  Target end date    Indications Long-term (current) use of anticoagulants, Atrila fibrillation (Resolved), Thrombosis, portal vein   INR check location  Preferred lab    Send INR reminders to ANTICOAG IMP   Comments        Provider Role Specialty Phone number   [name], MD  Internal Medicine [phone]        ANTICOAG TODAY: Anticoagulation Summary as of 11/26/2010              INR goal      Selected INR 0.9 (11/26/2010) Next INR check 12/10/2010   Weekly max dose (mg)  Target end date    Indications Long-term (current) use of anticoagulants, Atrila fibrillation (Resolved), Thrombosis, portal vein    Anticoagulation Episode Summary              INR check location  Preferred lab    Send INR reminders to ANTICOAG IMP   Comments        Provider Role Specialty Phone number   [name], MD  Internal Medicine [phone]        PATIENT INSTRUCTIONS: Patient Instructions  Patient instructed to take medications as defined in the Anti-coagulation Track section of this encounter.  Patient instructed to take today's dose.  Patient verbalized understanding of these instructions.        FOLLOW-UP Return in 2 weeks (on 12/10/2010) for Follow up  INR.  [name], III Pharm.D., CACP

## 2010-12-10 ENCOUNTER — Ambulatory Visit (INDEPENDENT_AMBULATORY_CARE_PROVIDER_SITE_OTHER): Payer: Medicare Other | Admitting: Pharmacist

## 2010-12-10 DIAGNOSIS — Z7901 Long term (current) use of anticoagulants: Secondary | ICD-10-CM

## 2010-12-10 DIAGNOSIS — I81 Portal vein thrombosis: Secondary | ICD-10-CM

## 2010-12-10 DIAGNOSIS — I4891 Unspecified atrial fibrillation: Secondary | ICD-10-CM

## 2010-12-10 LAB — POCT INR: INR: 1.1

## 2010-12-10 NOTE — Patient Instructions (Signed)
Patient instructed to take medications as defined in the Anti-coagulation Track section of this encounter.  Patient instructed to take today's dose.  Patient verbalized understanding of these instructions.    

## 2010-12-10 NOTE — Progress Notes (Signed)
Anti-Coagulation Progress Note  Ledon Weihe. is a 65 y.o. male who is currently on an anti-coagulation regimen.    RECENT RESULTS: Recent results are below, the most recent result is correlated with a dose of 67.5 mg. per week: Lab Results  Component Value Date   INR 1.1 12/10/2010   INR 0.9 11/26/2010   INR 1.06 11/19/2010    ANTI-COAG DOSE:   Latest dosing instructions   Total Sun Mon Tue Wed Thu Fri Sat   70 10 mg 10 mg 10 mg 10 mg 10 mg 10 mg 10 mg    (5 mg2) (5 mg2) (5 mg2) (5 mg2) (5 mg2) (5 mg2) (5 mg2)         ANTICOAG SUMMARY: Anticoagulation Episode Summary              Current INR goal  Next INR check 12/31/2010   INR from last check 1.1 (12/10/2010)     Weekly max dose (mg)  Target end date    Indications Long-term (current) use of anticoagulants, Atrila fibrillation (Resolved), Thrombosis, portal vein   INR check location  Preferred lab    Send INR reminders to ANTICOAG IMP   Comments        Provider Role Specialty Phone number   [name], MD  Internal Medicine [phone]        ANTICOAG TODAY: Anticoagulation Summary as of 12/10/2010              INR goal      Selected INR 1.1 (12/10/2010) Next INR check 12/31/2010   Weekly max dose (mg)  Target end date    Indications Long-term (current) use of anticoagulants, Atrila fibrillation (Resolved), Thrombosis, portal vein    Anticoagulation Episode Summary              INR check location  Preferred lab    Send INR reminders to ANTICOAG IMP   Comments        Provider Role Specialty Phone number   [name], MD  Internal Medicine [phone]        PATIENT INSTRUCTIONS: Patient Instructions  Patient instructed to take medications as defined in the Anti-coagulation Track section of this encounter.  Patient instructed to take today's dose.  Patient verbalized understanding of these instructions.        FOLLOW-UP Return in 3 weeks (on 12/31/2010) for Follow up INR.  [name], III Pharm.D., CACP

## 2010-12-17 ENCOUNTER — Other Ambulatory Visit: Payer: Self-pay | Admitting: *Deleted

## 2010-12-17 DIAGNOSIS — M549 Dorsalgia, unspecified: Secondary | ICD-10-CM

## 2010-12-17 DIAGNOSIS — M1990 Unspecified osteoarthritis, unspecified site: Secondary | ICD-10-CM

## 2010-12-17 DIAGNOSIS — G8929 Other chronic pain: Secondary | ICD-10-CM

## 2010-12-17 MED ORDER — OXYCODONE-ACETAMINOPHEN 10-650 MG PO TABS: 1.0000 | ORAL_TABLET | Freq: Four times a day (QID) | ORAL | Status: DC | PRN

## 2010-12-17 NOTE — Telephone Encounter (Signed)
Pt informed Rx is ready 

## 2010-12-17 NOTE — Telephone Encounter (Signed)
last refill 6/25  ( Pt states refill due Friday 8/3 )  Call pt when ready.

## 2010-12-17 NOTE — Telephone Encounter (Signed)
Refill printed and signed - nurse to complete. 

## 2010-12-20 ENCOUNTER — Other Ambulatory Visit: Payer: Self-pay | Admitting: Internal Medicine

## 2010-12-31 ENCOUNTER — Ambulatory Visit (INDEPENDENT_AMBULATORY_CARE_PROVIDER_SITE_OTHER): Payer: Medicare Other | Admitting: Pharmacist

## 2010-12-31 DIAGNOSIS — I4891 Unspecified atrial fibrillation: Secondary | ICD-10-CM

## 2010-12-31 DIAGNOSIS — I81 Portal vein thrombosis: Secondary | ICD-10-CM

## 2010-12-31 DIAGNOSIS — Z7901 Long term (current) use of anticoagulants: Secondary | ICD-10-CM

## 2010-12-31 MED ORDER — WARFARIN SODIUM 5 MG PO TABS: ORAL_TABLET | ORAL | Status: DC

## 2010-12-31 NOTE — Progress Notes (Signed)
Anti-Coagulation Progress Note  Travis Kotch. is a 65 y.o. male who is currently on an anti-coagulation regimen.    RECENT RESULTS: Recent results are below, the most recent result is correlated with a dose of 70 mg. per week: Lab Results  Component Value Date   INR 1.70 12/31/2010   INR 1.1 12/10/2010   INR 0.9 11/26/2010    ANTI-COAG DOSE:   Latest dosing instructions   Total Sun Mon Tue Wed Thu Fri Sat   80 12.5 mg 12.5 mg 10 mg 12.5 mg 10 mg 12.5 mg 10 mg    (5 mg2.5) (5 mg2.5) (5 mg2) (5 mg2.5) (5 mg2) (5 mg2.5) (5 mg2)         ANTICOAG SUMMARY: Anticoagulation Episode Summary              Current INR goal  Next INR check 01/14/2011   INR from last check 1.70 (12/31/2010)     Weekly max dose (mg)  Target end date    Indications Long-term (current) use of anticoagulants, Atrila fibrillation (Resolved), Thrombosis, portal vein   INR check location  Preferred lab    Send INR reminders to ANTICOAG IMP   Comments        Provider Role Specialty Phone number   [name], MD  Internal Medicine [phone]        ANTICOAG TODAY: Anticoagulation Summary as of 12/31/2010              INR goal      Selected INR 1.70 (12/31/2010) Next INR check 01/14/2011   Weekly max dose (mg)  Target end date    Indications Long-term (current) use of anticoagulants, Atrila fibrillation (Resolved), Thrombosis, portal vein    Anticoagulation Episode Summary              INR check location  Preferred lab    Send INR reminders to ANTICOAG IMP   Comments        Provider Role Specialty Phone number   [name], MD  Internal Medicine [phone]        PATIENT INSTRUCTIONS: Patient Instructions  Patient instructed to take medications as defined in the Anti-coagulation Track section of this encounter.  Patient instructed to take today's dose.  Patient verbalized understanding of these instructions.        FOLLOW-UP Return in 2 weeks (on 01/14/2011) for Follow  up INR.  [name], III Pharm.D., CACP

## 2010-12-31 NOTE — Progress Notes (Signed)
Agree with the plan.

## 2010-12-31 NOTE — Patient Instructions (Signed)
Patient instructed to take medications as defined in the Anti-coagulation Track section of this encounter.  Patient instructed to take today's dose.  Patient verbalized understanding of these instructions.    

## 2011-01-02 ENCOUNTER — Ambulatory Visit: Payer: Medicare Other | Admitting: Internal Medicine

## 2011-01-14 ENCOUNTER — Ambulatory Visit (INDEPENDENT_AMBULATORY_CARE_PROVIDER_SITE_OTHER): Payer: Medicare Other | Admitting: Pharmacist

## 2011-01-14 ENCOUNTER — Other Ambulatory Visit: Payer: Self-pay | Admitting: *Deleted

## 2011-01-14 DIAGNOSIS — M549 Dorsalgia, unspecified: Secondary | ICD-10-CM

## 2011-01-14 DIAGNOSIS — G8929 Other chronic pain: Secondary | ICD-10-CM

## 2011-01-14 DIAGNOSIS — I4891 Unspecified atrial fibrillation: Secondary | ICD-10-CM

## 2011-01-14 DIAGNOSIS — M1990 Unspecified osteoarthritis, unspecified site: Secondary | ICD-10-CM

## 2011-01-14 DIAGNOSIS — Z7901 Long term (current) use of anticoagulants: Secondary | ICD-10-CM

## 2011-01-14 DIAGNOSIS — I81 Portal vein thrombosis: Secondary | ICD-10-CM

## 2011-01-14 LAB — POCT INR: INR: 1.6

## 2011-01-14 NOTE — Patient Instructions (Signed)
Patient instructed to take medications as defined in the Anti-coagulation Track section of this encounter.  Patient instructed to take today's dose.  Patient verbalized understanding of these instructions.    

## 2011-01-14 NOTE — Progress Notes (Signed)
Anti-Coagulation Progress Note  [name]. is a 65 y.o. male who is currently on an anti-coagulation regimen.    RECENT RESULTS: Recent results are below, the most recent result is correlated with a dose of 80 mg. per week: Lab Results  Component Value Date   INR 1.60 01/14/2011   INR 1.70 12/31/2010   INR 1.1 12/10/2010    ANTI-COAG DOSE:   Latest dosing instructions   Total Sun Mon Tue Wed Thu Fri Sat   87.5 12.5 mg 12.5 mg 12.5 mg 12.5 mg 12.5 mg 12.5 mg 12.5 mg    (5 mg2.5) (5 mg2.5) (5 mg2.5) (5 mg2.5) (5 mg2.5) (5 mg2.5) (5 mg2.5)         ANTICOAG SUMMARY: Anticoagulation Episode Summary              Current INR goal  Next INR check 01/28/2011   INR from last check 1.60 (01/14/2011)     Weekly max dose (mg)  Target end date    Indications Long-term (current) use of anticoagulants, Atrila fibrillation (Resolved), Thrombosis, portal vein   INR check location  Preferred lab    Send INR reminders to ANTICOAG IMP   Comments        Provider Role Specialty Phone number   [name], MD  Internal Medicine [phone]        ANTICOAG TODAY: Anticoagulation Summary as of 01/14/2011              INR goal      Selected INR 1.60 (01/14/2011) Next INR check 01/28/2011   Weekly max dose (mg)  Target end date    Indications Long-term (current) use of anticoagulants, Atrila fibrillation (Resolved), Thrombosis, portal vein    Anticoagulation Episode Summary              INR check location  Preferred lab    Send INR reminders to ANTICOAG IMP   Comments        Provider Role Specialty Phone number   [name], MD  Internal Medicine [phone]        PATIENT INSTRUCTIONS: Patient Instructions  Patient instructed to take medications as defined in the Anti-coagulation Track section of this encounter.  Patient instructed to take today's dose.  Patient verbalized understanding of these instructions.        FOLLOW-UP Return in 2 weeks (on  01/28/2011) for Follow up INR.  [name], III Pharm.D., CACP

## 2011-01-16 NOTE — Telephone Encounter (Signed)
Pt called this afternoon 01/16/2011 said that he is out of the medication.  Would like to pick up the prescription on Thursday afternoon.  Call at [phone] when ready.

## 2011-01-17 MED ORDER — OXYCODONE-ACETAMINOPHEN 10-650 MG PO TABS: 1.0000 | ORAL_TABLET | Freq: Four times a day (QID) | ORAL | Status: DC | PRN

## 2011-01-17 NOTE — Telephone Encounter (Signed)
Refill printed and signed - nurse to complete. 

## 2011-01-28 ENCOUNTER — Ambulatory Visit: Payer: Medicare Other

## 2011-02-07 LAB — DIFFERENTIAL
Eosinophils Relative: 2
Lymphocytes Relative: 27
Lymphs Abs: 2.4
Monocytes Absolute: 0.9

## 2011-02-07 LAB — BASIC METABOLIC PANEL
Calcium: 8.9
Creatinine, Ser: 1.37
GFR calc Af Amer: >60
GFR calc non Af Amer: 52 — ABNORMAL LOW
Glucose, Bld: 109 — ABNORMAL HIGH
Sodium: 135

## 2011-02-07 LAB — HEPATIC FUNCTION PANEL
ALT: 18
Alkaline Phosphatase: 61
Indirect Bilirubin: 1.1 — ABNORMAL HIGH
Total Protein: 7.2

## 2011-02-07 LAB — COMPREHENSIVE METABOLIC PANEL
AST: 20
Albumin: 3.8
Calcium: 9.7
Creatinine, Ser: 1.75 — ABNORMAL HIGH
GFR calc Af Amer: 48 — ABNORMAL LOW

## 2011-02-07 LAB — CBC
HCT: 48.8
MCV: 92.3
RBC: 5.29
WBC: 9.2

## 2011-02-07 LAB — I-STAT 8, (EC8 V) (CONVERTED LAB)
Acid-base deficit: 4 — ABNORMAL HIGH
Glucose, Bld: 107 — ABNORMAL HIGH
HCT: 52
Hemoglobin: 17.7 — ABNORMAL HIGH
Potassium: 4.6
Sodium: 138
TCO2: 21

## 2011-02-07 LAB — CARDIAC PANEL(CRET KIN+CKTOT+MB+TROPI)
CK, MB: 1.1
Total CK: 46
Troponin I: 0.04

## 2011-02-07 LAB — PROTIME-INR
INR: 2 — ABNORMAL HIGH
INR: 2.3 — ABNORMAL HIGH
Prothrombin Time: 22.9 — ABNORMAL HIGH
Prothrombin Time: 27.3 — ABNORMAL HIGH

## 2011-02-07 LAB — POCT CARDIAC MARKERS
CKMB, poc: 1.7
Troponin i, poc: <0.05

## 2011-02-07 LAB — TROPONIN I: Troponin I: 0.03

## 2011-02-07 LAB — APTT: aPTT: 28

## 2011-02-08 LAB — I-STAT 8, (EC8 V) (CONVERTED LAB)
Acid-base deficit: 3 — ABNORMAL HIGH
BUN: 18
Chloride: 110
HCT: 41
Hemoglobin: 13.9
Operator id: 198171
Potassium: 3.8
Sodium: 142

## 2011-02-08 LAB — POCT I-STAT CREATININE
Creatinine, Ser: 1.2
Operator id: 198171

## 2011-02-08 LAB — APTT: aPTT: 30

## 2011-02-08 LAB — CBC
Hemoglobin: 12.7 — ABNORMAL LOW
MCHC: 33.5
RBC: 4.01 — ABNORMAL LOW
WBC: 11.2 — ABNORMAL HIGH

## 2011-02-08 LAB — DIFFERENTIAL
Basophils Relative: 0
Lymphs Abs: 2.6
Monocytes Absolute: 1
Monocytes Relative: 9
Neutro Abs: 7.4

## 2011-02-08 LAB — PROTIME-INR
INR: 2.5 — ABNORMAL HIGH
INR: 2.9 — ABNORMAL HIGH
Prothrombin Time: 28.2 — ABNORMAL HIGH

## 2011-02-08 LAB — B-NATRIURETIC PEPTIDE (CONVERTED LAB): Pro B Natriuretic peptide (BNP): 147 — ABNORMAL HIGH

## 2011-02-12 ENCOUNTER — Other Ambulatory Visit: Payer: Self-pay | Admitting: *Deleted

## 2011-02-12 DIAGNOSIS — G8929 Other chronic pain: Secondary | ICD-10-CM

## 2011-02-12 DIAGNOSIS — M1990 Unspecified osteoarthritis, unspecified site: Secondary | ICD-10-CM

## 2011-02-12 DIAGNOSIS — M549 Dorsalgia, unspecified: Secondary | ICD-10-CM

## 2011-02-14 MED ORDER — OXYCODONE-ACETAMINOPHEN 10-650 MG PO TABS: 1.0000 | ORAL_TABLET | Freq: Four times a day (QID) | ORAL | Status: DC | PRN

## 2011-02-14 NOTE — Telephone Encounter (Signed)
Pt informed script ready 

## 2011-02-14 NOTE — Telephone Encounter (Signed)
Refill printed and signed - nurse to complete. 

## 2011-02-19 LAB — URINALYSIS, ROUTINE W REFLEX MICROSCOPIC
Bilirubin Urine: NEGATIVE
Ketones, ur: NEGATIVE
Nitrite: NEGATIVE
Nitrite: NEGATIVE
Protein, ur: 30 — AB
Urobilinogen, UA: 1
pH: 5.5
pH: 6

## 2011-02-19 LAB — CBC
HCT: 33.1 — ABNORMAL LOW
HCT: 34.3 — ABNORMAL LOW
HCT: 48.5
Hemoglobin: 11 — ABNORMAL LOW
Hemoglobin: 11.7 — ABNORMAL LOW
MCHC: 33.3
MCHC: 34.2
MCV: 94.2
MCV: 96.3
MCV: 96.5
Platelets: 127 — ABNORMAL LOW
Platelets: 152
Platelets: 304
RBC: 3.27 — ABNORMAL LOW
RBC: 3.44 — ABNORMAL LOW
RBC: 3.56 — ABNORMAL LOW
RDW: 14.1
RDW: 14.7
RDW: 14.8
WBC: 13.2 — ABNORMAL HIGH
WBC: 14.7 — ABNORMAL HIGH

## 2011-02-19 LAB — COMPREHENSIVE METABOLIC PANEL
Albumin: 4.4
BUN: 22
Creatinine, Ser: 1.55 — ABNORMAL HIGH
Potassium: 4.1
Total Protein: 7.6

## 2011-02-19 LAB — DIGOXIN LEVEL: Digoxin Level: 0.9

## 2011-02-19 LAB — BASIC METABOLIC PANEL
BUN: 11
BUN: 12
BUN: 13
CO2: 25
CO2: 27
Chloride: 107
Chloride: 107
Creatinine, Ser: 0.8
Creatinine, Ser: 1.17
GFR calc Af Amer: >60
GFR calc Af Amer: >60
GFR calc non Af Amer: 58 — ABNORMAL LOW
GFR calc non Af Amer: >60
Glucose, Bld: 101 — ABNORMAL HIGH
Glucose, Bld: 123 — ABNORMAL HIGH
Potassium: 3.9
Potassium: 4.9
Sodium: 140
Sodium: 141

## 2011-02-19 LAB — URINALYSIS, DIPSTICK ONLY
Glucose, UA: NEGATIVE
Specific Gravity, Urine: 1.015
Urobilinogen, UA: 0.2

## 2011-02-19 LAB — URINE MICROSCOPIC-ADD ON

## 2011-02-19 LAB — URINE CULTURE: Special Requests: NEGATIVE

## 2011-02-19 LAB — APTT: aPTT: 22 — ABNORMAL LOW

## 2011-02-19 LAB — DIFFERENTIAL
Lymphocytes Relative: 30
Lymphs Abs: 2.1
Monocytes Absolute: 0.5
Monocytes Relative: 8
Neutro Abs: 4

## 2011-02-19 LAB — PROTIME-INR
INR: 0.9
INR: 1.3
INR: 1.6 — ABNORMAL HIGH
INR: 2 — ABNORMAL HIGH
INR: 2.4 — ABNORMAL HIGH
INR: 2.7 — ABNORMAL HIGH
Prothrombin Time: 23.4 — ABNORMAL HIGH
Prothrombin Time: 30.3 — ABNORMAL HIGH

## 2011-02-19 LAB — MAGNESIUM: Magnesium: 2

## 2011-02-19 LAB — ABO/RH: ABO/RH(D): AB POS

## 2011-02-19 LAB — TYPE AND SCREEN

## 2011-02-20 LAB — GLUCOSE, CAPILLARY
Glucose-Capillary: 109 — ABNORMAL HIGH
Glucose-Capillary: 110 — ABNORMAL HIGH
Glucose-Capillary: 121 — ABNORMAL HIGH

## 2011-02-20 LAB — BASIC METABOLIC PANEL
BUN: 10
BUN: 10
BUN: 14
BUN: 16
CO2: 24
CO2: 24
CO2: 26
CO2: 27
Calcium: 8.1 — ABNORMAL LOW
Calcium: 8.3 — ABNORMAL LOW
Calcium: 8.6
Chloride: 106
Chloride: 107
Creatinine, Ser: 1.02
Creatinine, Ser: 1.12
Creatinine, Ser: 1.15
Creatinine, Ser: 1.15
GFR calc Af Amer: >60
GFR calc Af Amer: >60
GFR calc Af Amer: >60
GFR calc Af Amer: >60
GFR calc non Af Amer: >60
GFR calc non Af Amer: >60
GFR calc non Af Amer: >60
GFR calc non Af Amer: >60
Glucose, Bld: 101 — ABNORMAL HIGH
Glucose, Bld: 103 — ABNORMAL HIGH
Potassium: 3.5
Potassium: 4.1
Sodium: 137
Sodium: 139

## 2011-02-20 LAB — PROTIME-INR
INR: 1.9 — ABNORMAL HIGH
INR: 2 — ABNORMAL HIGH
INR: 2.2 — ABNORMAL HIGH
INR: 2.3 — ABNORMAL HIGH
INR: 2.6 — ABNORMAL HIGH
Prothrombin Time: 21 — ABNORMAL HIGH
Prothrombin Time: 23.6 — ABNORMAL HIGH
Prothrombin Time: 25.8 — ABNORMAL HIGH
Prothrombin Time: 27 — ABNORMAL HIGH
Prothrombin Time: 29.1 — ABNORMAL HIGH
Prothrombin Time: 29.7 — ABNORMAL HIGH

## 2011-02-20 LAB — CBC
HCT: 30.3 — ABNORMAL LOW
HCT: 35.2 — ABNORMAL LOW
Hemoglobin: 11.9 — ABNORMAL LOW
Hemoglobin: 12.2 — ABNORMAL LOW
Hemoglobin: 12.5 — ABNORMAL LOW
MCHC: 33.4
MCHC: 33.7
MCV: 93.7
MCV: 94.4
Platelets: 410 — ABNORMAL HIGH
Platelets: 557 — ABNORMAL HIGH
Platelets: 560 — ABNORMAL HIGH
RBC: 3.9 — ABNORMAL LOW
RBC: 3.99 — ABNORMAL LOW
RDW: 14.5
RDW: 14.9
WBC: 9.6

## 2011-02-20 LAB — LIPID PANEL
LDL Cholesterol: 64
Triglycerides: 59

## 2011-02-20 LAB — APTT: aPTT: 36

## 2011-02-20 LAB — FECAL LACTOFERRIN, QUANT: Fecal Lactoferrin: POSITIVE

## 2011-02-20 LAB — CLOSTRIDIUM DIFFICILE EIA

## 2011-02-20 LAB — DIGOXIN LEVEL: Digoxin Level: 1.7

## 2011-02-20 LAB — OCCULT BLOOD X 1 CARD TO LAB, STOOL: Fecal Occult Bld: POSITIVE

## 2011-02-20 LAB — B-NATRIURETIC PEPTIDE (CONVERTED LAB): Pro B Natriuretic peptide (BNP): 318 — ABNORMAL HIGH

## 2011-02-25 ENCOUNTER — Ambulatory Visit (INDEPENDENT_AMBULATORY_CARE_PROVIDER_SITE_OTHER): Payer: Medicare Other | Admitting: Pharmacist

## 2011-02-25 DIAGNOSIS — Z7901 Long term (current) use of anticoagulants: Secondary | ICD-10-CM

## 2011-02-25 DIAGNOSIS — I81 Portal vein thrombosis: Secondary | ICD-10-CM

## 2011-02-25 DIAGNOSIS — I4891 Unspecified atrial fibrillation: Secondary | ICD-10-CM

## 2011-02-25 LAB — URINALYSIS, ROUTINE W REFLEX MICROSCOPIC
Bilirubin Urine: NEGATIVE
Glucose, UA: NEGATIVE
Hgb urine dipstick: NEGATIVE
Ketones, ur: NEGATIVE
Nitrite: NEGATIVE
Protein, ur: NEGATIVE
Specific Gravity, Urine: 1.025
Urobilinogen, UA: 1
pH: 6

## 2011-02-25 LAB — CBC
HCT: 41.2
HCT: 41.3
HCT: 41.5
HCT: 41.7
HCT: 43
HCT: 43.1
Hemoglobin: 14
Hemoglobin: 14.7
MCHC: 33.5
MCHC: 33.8
MCHC: 33.8
MCHC: 34.1
MCHC: 34.1
MCHC: 34.3
MCV: 93.9
MCV: 94.5
MCV: 94.8
MCV: 95.7
MCV: 95.9
Platelets: 142 — ABNORMAL LOW
Platelets: 153
Platelets: 154
Platelets: 159
Platelets: 166
Platelets: 167
Platelets: 184
RBC: 4.25
RBC: 4.35
RBC: 4.57
RBC: 4.58
RDW: 14.3
RDW: 14.7
RDW: 14.8
RDW: 14.9
WBC: 10.4
WBC: 11.4 — ABNORMAL HIGH
WBC: 11.9 — ABNORMAL HIGH
WBC: 12.4 — ABNORMAL HIGH

## 2011-02-25 LAB — DIFFERENTIAL
Basophils Absolute: 0
Basophils Absolute: 0
Basophils Relative: 0
Basophils Relative: 0
Basophils Relative: 1
Eosinophils Absolute: 0.1 — ABNORMAL LOW
Eosinophils Absolute: 0.2
Eosinophils Relative: 1
Eosinophils Relative: 2
Lymphocytes Relative: 21
Lymphs Abs: 2.6
Lymphs Abs: 2.8
Monocytes Absolute: 0.9
Monocytes Absolute: 1
Monocytes Relative: 8
Monocytes Relative: 9
Neutro Abs: 5.4
Neutro Abs: 8.7 — ABNORMAL HIGH
Neutrophils Relative %: 57
Neutrophils Relative %: 64
Neutrophils Relative %: 70

## 2011-02-25 LAB — HEPARIN LEVEL (UNFRACTIONATED)
Heparin Unfractionated: 0.43
Heparin Unfractionated: 0.44
Heparin Unfractionated: 0.65
Heparin Unfractionated: 0.75 — ABNORMAL HIGH
Heparin Unfractionated: 0.93 — ABNORMAL HIGH

## 2011-02-25 LAB — BASIC METABOLIC PANEL
BUN: 15
BUN: 16
BUN: 16
BUN: 18
CO2: 21
CO2: 24
CO2: 25
CO2: 26
CO2: 27
CO2: 27
CO2: 28
CO2: 28
Calcium: 8.7
Calcium: 9.1
Calcium: 9.1
Calcium: 9.2
Chloride: 107
Chloride: 107
Chloride: 109
Chloride: 111
Creatinine, Ser: 1.07
Creatinine, Ser: 1.09
Creatinine, Ser: 1.11
Creatinine, Ser: 1.3
GFR calc Af Amer: >60
GFR calc Af Amer: >60
GFR calc Af Amer: >60
GFR calc Af Amer: >60
GFR calc non Af Amer: 56 — ABNORMAL LOW
GFR calc non Af Amer: >60
Glucose, Bld: 107 — ABNORMAL HIGH
Glucose, Bld: 90
Glucose, Bld: 91
Glucose, Bld: 96
Potassium: 4.3
Potassium: 4.4
Potassium: 4.7
Potassium: 4.7
Sodium: 138
Sodium: 140
Sodium: 141

## 2011-02-25 LAB — APTT: aPTT: 23 — ABNORMAL LOW

## 2011-02-25 LAB — PROTIME-INR
INR: 0.9
Prothrombin Time: 12.6
Prothrombin Time: 12.9
Prothrombin Time: 16.1 — ABNORMAL HIGH
Prothrombin Time: 17.3 — ABNORMAL HIGH

## 2011-02-25 LAB — RAPID URINE DRUG SCREEN, HOSP PERFORMED
Amphetamines: NOT DETECTED
Benzodiazepines: NOT DETECTED
Cocaine: NOT DETECTED

## 2011-02-25 LAB — CK TOTAL AND CKMB (NOT AT ARMC)
CK, MB: 1.3
CK, MB: 2.1
Relative Index: INVALID
Relative Index: INVALID
Total CK: 37
Total CK: 58

## 2011-02-25 LAB — CULTURE, BLOOD (ROUTINE X 2)

## 2011-02-25 LAB — CARDIAC PANEL(CRET KIN+CKTOT+MB+TROPI)
CK, MB: 1.5
Relative Index: INVALID
Relative Index: INVALID
Total CK: 42
Troponin I: 0.04
Troponin I: 0.05

## 2011-02-25 LAB — HEPATIC FUNCTION PANEL
Bilirubin, Direct: 0.2
Indirect Bilirubin: 0.9
Total Protein: 6

## 2011-02-25 LAB — URINE MICROSCOPIC-ADD ON

## 2011-02-25 LAB — BASIC METABOLIC PANEL WITH GFR
BUN: 26 — ABNORMAL HIGH
Chloride: 108
Glucose, Bld: 126 — ABNORMAL HIGH
Potassium: 4.4
Sodium: 142

## 2011-02-25 LAB — SODIUM, URINE, RANDOM: Sodium, Ur: 141

## 2011-02-25 LAB — TSH: TSH: 1.063

## 2011-02-25 LAB — DIGOXIN LEVEL: Digoxin Level: 0.5 — ABNORMAL LOW

## 2011-02-25 LAB — TROPONIN I: Troponin I: 0.05

## 2011-02-25 NOTE — Progress Notes (Signed)
Anti-Coagulation Progress Note  Travis Shaul. is a 65 y.o. male who is currently on an anti-coagulation regimen.    RECENT RESULTS: Recent results are below, the most recent result is correlated with a dose of 97.5 mg. per week: Lab Results  Component Value Date   INR 2.00 02/25/2011   INR 1.60 01/14/2011   INR 1.70 12/31/2010    ANTI-COAG DOSE:   Latest dosing instructions   Total Sun Mon Tue Wed Thu Fri Sat   92.5 12.5 mg 15 mg 12.5 mg 12.5 mg 15 mg 12.5 mg 12.5 mg    (5 mg2.5) (5 mg3) (5 mg2.5) (5 mg2.5) (5 mg3) (5 mg2.5) (5 mg2.5)         ANTICOAG SUMMARY: Anticoagulation Episode Summary              Current INR goal  Next INR check 03/18/2011   INR from last check 2.00 (02/25/2011)     Weekly max dose (mg)  Target end date    Indications Long-term (current) use of anticoagulants, Atrila fibrillation (Resolved), Thrombosis, portal vein   INR check location  Preferred lab    Send INR reminders to ANTICOAG IMP   Comments        Provider Role Specialty Phone number   [name], MD  Internal Medicine [phone]        ANTICOAG TODAY: Anticoagulation Summary as of 02/25/2011              INR goal      Selected INR 2.00 (02/25/2011) Next INR check 03/18/2011   Weekly max dose (mg)  Target end date    Indications Long-term (current) use of anticoagulants, Atrila fibrillation (Resolved), Thrombosis, portal vein    Anticoagulation Episode Summary              INR check location  Preferred lab    Send INR reminders to ANTICOAG IMP   Comments        Provider Role Specialty Phone number   [name], MD  Internal Medicine [phone]        PATIENT INSTRUCTIONS: Patient Instructions  Patient instructed to take medications as defined in the Anti-coagulation Track section of this encounter.  Patient instructed to take today's dose.  Patient verbalized understanding of these instructions.        FOLLOW-UP Return in 3 weeks (on  03/18/2011) for Follow up INR.  [name], III Pharm.D., CACP

## 2011-02-25 NOTE — Patient Instructions (Signed)
Patient instructed to take medications as defined in the Anti-coagulation Track section of this encounter.  Patient instructed to take today's dose.  Patient verbalized understanding of these instructions.    

## 2011-02-26 IMAGING — US US RENAL
1 series · 14 of 25 positions shown · non-contrast
Comparison: None.

CLINICAL DATA: Renal insufficiency

RENAL/URINARY TRACT ULTRASOUND COMPLETE

[Series 1: us renal · 0.24mm/px · 14 of 44 slices shown]
[im 1/44]
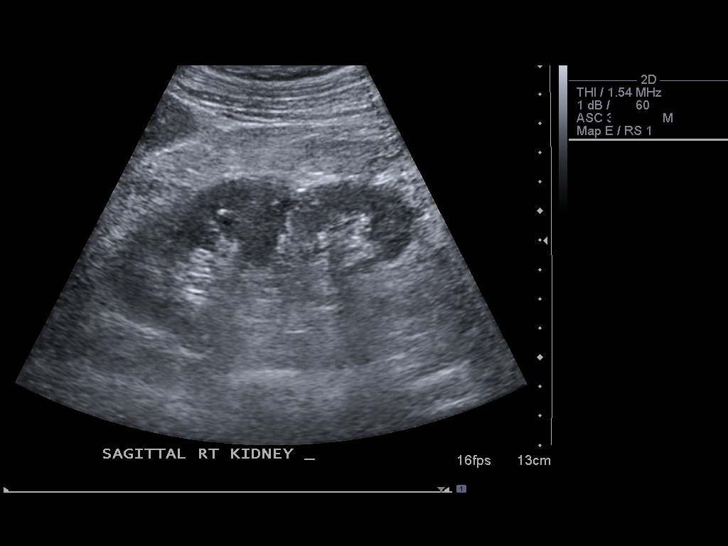
[im 4/44]
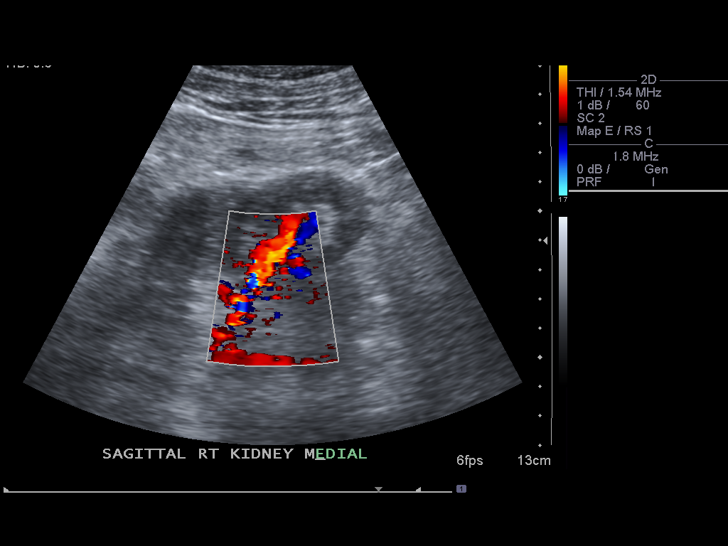
[im 8/44]
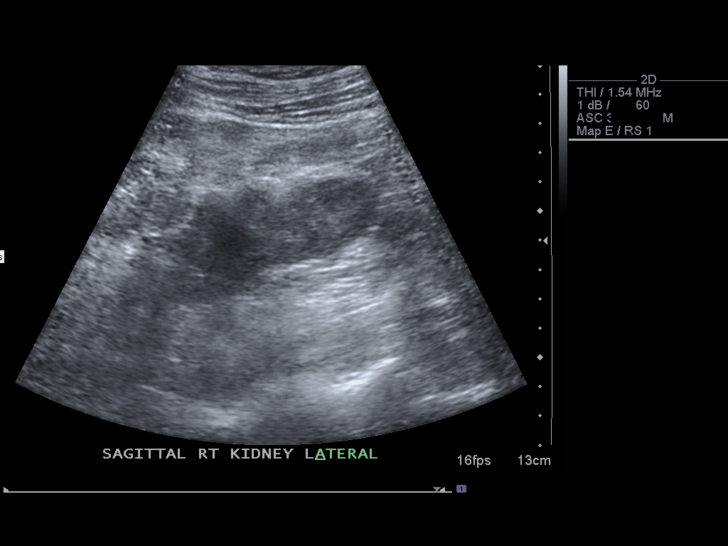
[im 11/44]
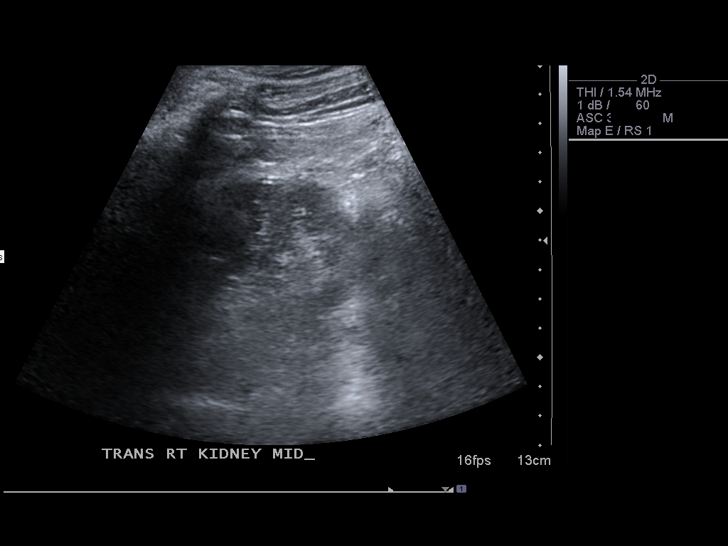
[im 15/44]
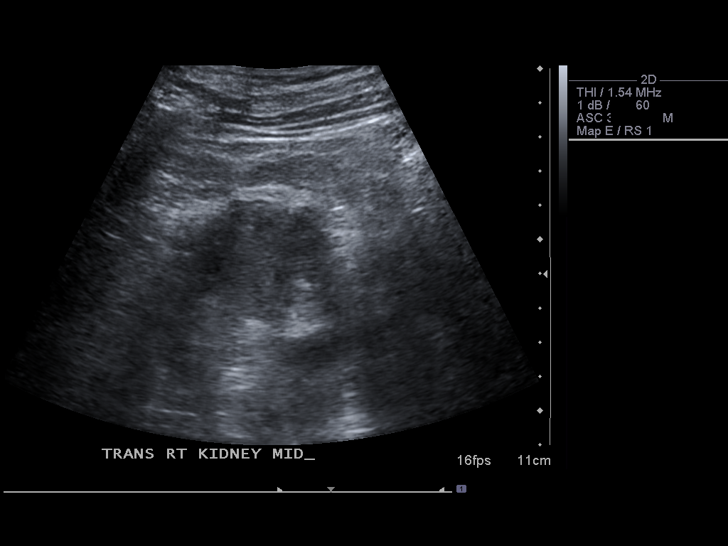
[im 17/44]
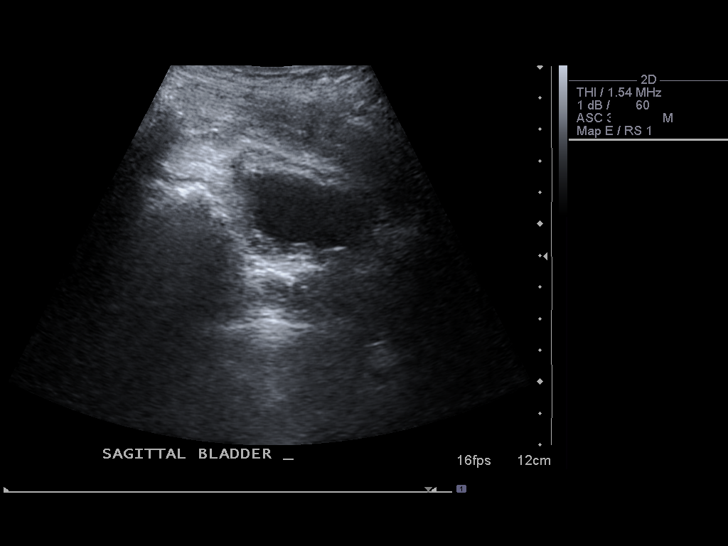
[im 20/44]
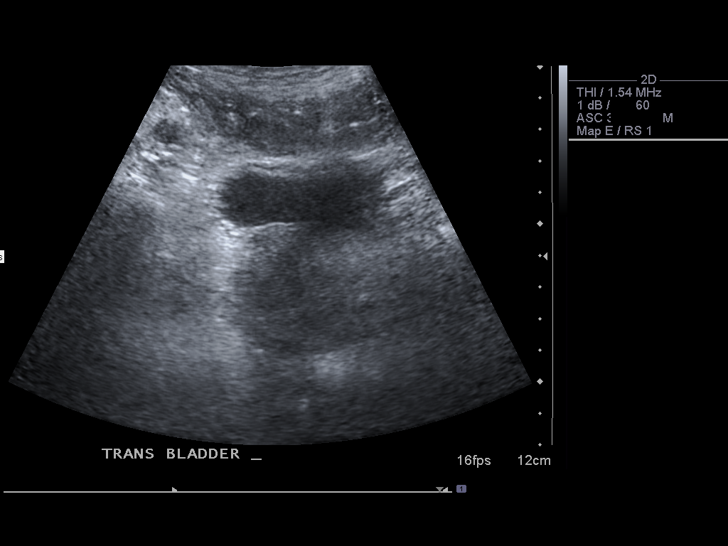
[im 24/44]
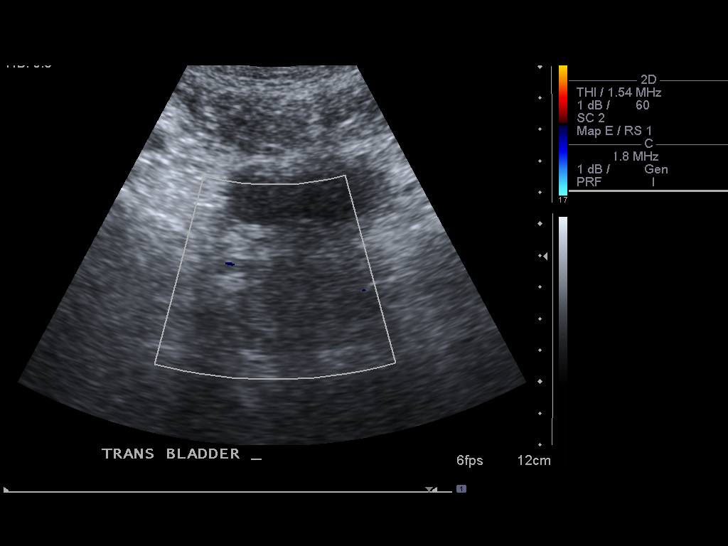
[im 27/44]
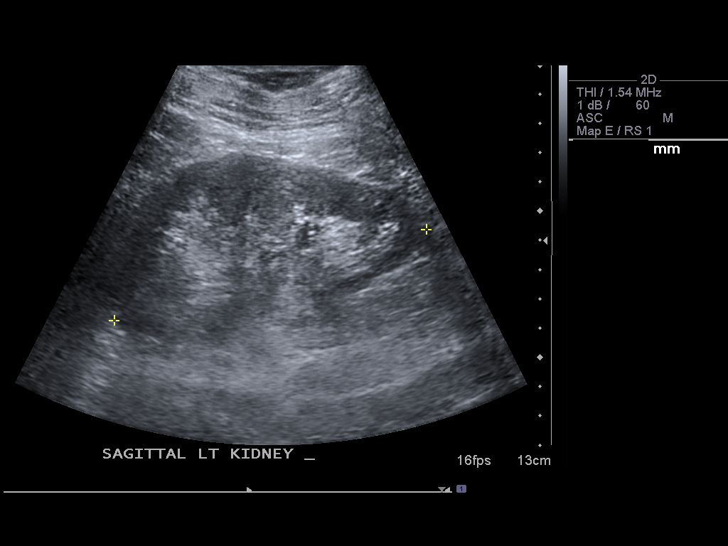
[im 29/44]
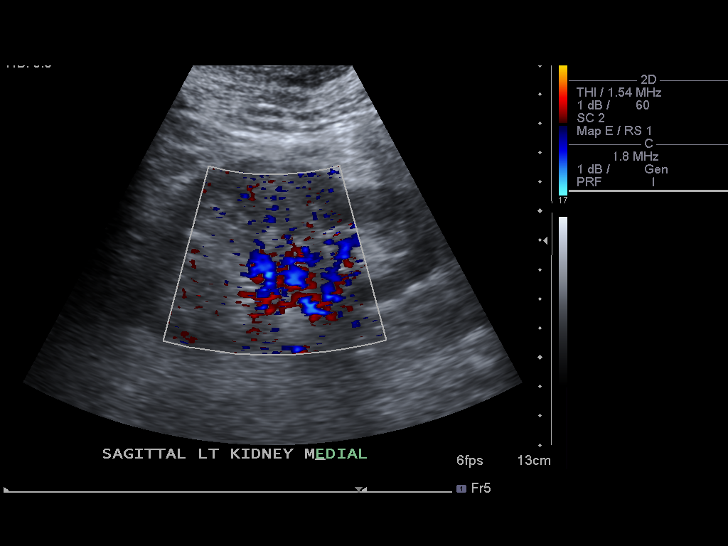
[im 33/44]
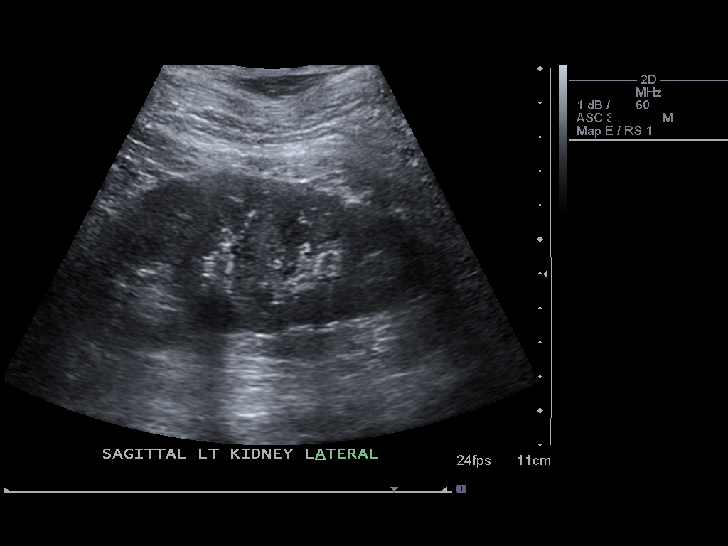
[im 36/44]
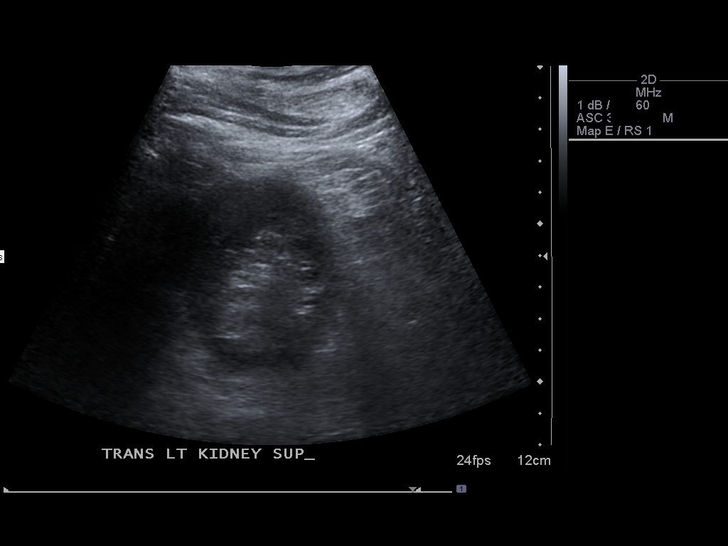
[im 40/44]
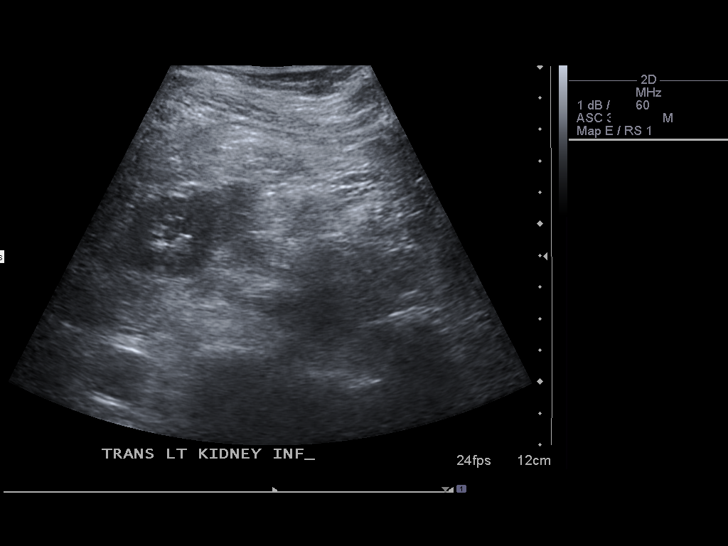
[im 44/44]
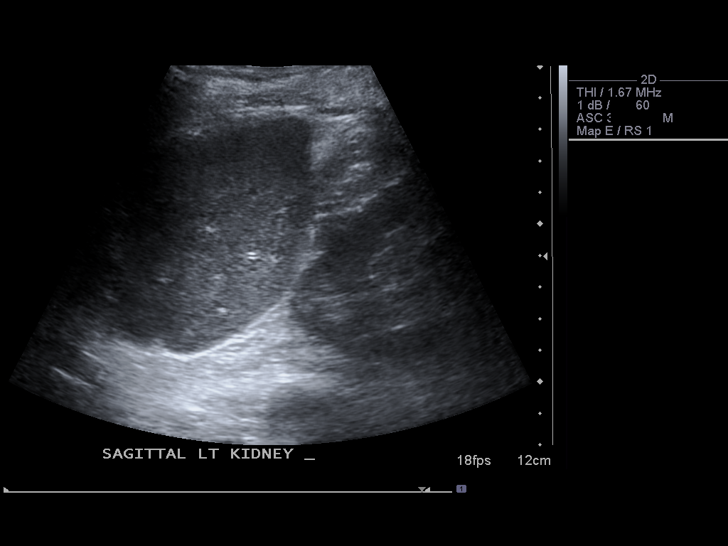

[14 of 25 positions shown; findings below may reference images not displayed]

FINDINGS: Right Kidney:  11.0 cm in length.  No hydronephrosis or focal
parenchymal lesions.  No obvious stones.

Left Kidney:  11.1 cm in length.  No hydronephrosis or other
pathology.

Bladder:  Unremarkable.  No significant prostatic enlargement.
IMPRESSION: No pathological findings.

## 2011-02-27 LAB — MAGNESIUM
Magnesium: 1.7
Magnesium: 1.7

## 2011-02-27 LAB — URINE MICROSCOPIC-ADD ON

## 2011-02-27 LAB — HEPATIC FUNCTION PANEL
AST: 16
Bilirubin, Direct: 0.2
Indirect Bilirubin: 1.6 — ABNORMAL HIGH
Total Bilirubin: 1
Total Protein: 8.4 — ABNORMAL HIGH

## 2011-02-27 LAB — DIFFERENTIAL
Basophils Absolute: 0
Basophils Absolute: 0
Basophils Relative: 0
Eosinophils Absolute: 0
Eosinophils Absolute: 0
Eosinophils Relative: 0
Lymphocytes Relative: 10 — ABNORMAL LOW
Monocytes Absolute: 0.8 — ABNORMAL HIGH
Monocytes Relative: 9
Neutro Abs: 5.1
Neutrophils Relative %: 55

## 2011-02-27 LAB — BASIC METABOLIC PANEL
CO2: 29
Calcium: 8.3 — ABNORMAL LOW
Calcium: 9.5
Chloride: 102
Chloride: 105
Creatinine, Ser: 1.1
Creatinine, Ser: 1.23
GFR calc Af Amer: >60
GFR calc non Af Amer: >60
Glucose, Bld: 80

## 2011-02-27 LAB — URINALYSIS, ROUTINE W REFLEX MICROSCOPIC
Glucose, UA: NEGATIVE
Specific Gravity, Urine: 1.031 — ABNORMAL HIGH
Urobilinogen, UA: 1

## 2011-02-27 LAB — CBC
HCT: 51.3
HCT: 57.8 — ABNORMAL HIGH
Hemoglobin: 19.3 — ABNORMAL HIGH
MCHC: 33.1
MCV: 96.4
MCV: 96.7
Platelets: 192
Platelets: 192
RDW: 15.3 — ABNORMAL HIGH
WBC: 11.4 — ABNORMAL HIGH
WBC: 9.6

## 2011-02-27 LAB — RAPID URINE DRUG SCREEN, HOSP PERFORMED
Benzodiazepines: NOT DETECTED
Tetrahydrocannabinol: NOT DETECTED

## 2011-02-27 LAB — COMPREHENSIVE METABOLIC PANEL
ALT: 15
AST: 24
Albumin: 3.7
Alkaline Phosphatase: 63
Chloride: 100
GFR calc Af Amer: >60
Potassium: 3.3 — ABNORMAL LOW
Sodium: 142
Total Bilirubin: 2 — ABNORMAL HIGH
Total Protein: 7

## 2011-02-27 LAB — I-STAT 8, (EC8 V) (CONVERTED LAB)
Acid-Base Excess: 8 — ABNORMAL HIGH
Bicarbonate: 31.7 — ABNORMAL HIGH
HCT: 62 — ABNORMAL HIGH
Operator id: 270111
pCO2, Ven: 39.5 — ABNORMAL LOW

## 2011-02-27 LAB — LIPASE, BLOOD: Lipase: 25

## 2011-02-27 LAB — ETHANOL: Alcohol, Ethyl (B): <5

## 2011-02-27 LAB — PROTIME-INR: Prothrombin Time: 12.9

## 2011-02-27 LAB — POCT I-STAT CREATININE: Creatinine, Ser: 1.1

## 2011-03-18 ENCOUNTER — Ambulatory Visit: Payer: Medicare Other

## 2011-03-18 ENCOUNTER — Other Ambulatory Visit: Payer: Self-pay | Admitting: *Deleted

## 2011-03-18 DIAGNOSIS — G8929 Other chronic pain: Secondary | ICD-10-CM

## 2011-03-18 DIAGNOSIS — M549 Dorsalgia, unspecified: Secondary | ICD-10-CM

## 2011-03-18 DIAGNOSIS — M1990 Unspecified osteoarthritis, unspecified site: Secondary | ICD-10-CM

## 2011-03-18 NOTE — Telephone Encounter (Signed)
Last refill 9/25 Call pt when ready

## 2011-03-20 ENCOUNTER — Ambulatory Visit (INDEPENDENT_AMBULATORY_CARE_PROVIDER_SITE_OTHER): Payer: Medicare Other | Admitting: *Deleted

## 2011-03-20 ENCOUNTER — Other Ambulatory Visit: Payer: Self-pay | Admitting: Internal Medicine

## 2011-03-20 ENCOUNTER — Encounter: Payer: Self-pay | Admitting: Internal Medicine

## 2011-03-20 DIAGNOSIS — I495 Sick sinus syndrome: Secondary | ICD-10-CM

## 2011-03-20 LAB — PACEMAKER DEVICE OBSERVATION
BATTERY VOLTAGE: 2.8 V
BRDY-0002RV: 50 {beats}/min
BRDY-0004RV: 120 {beats}/min
DEVICE MODEL PM: 2045834

## 2011-03-20 MED ORDER — OXYCODONE-ACETAMINOPHEN 10-650 MG PO TABS: 1.0000 | ORAL_TABLET | Freq: Four times a day (QID) | ORAL | Status: DC | PRN

## 2011-03-20 NOTE — Telephone Encounter (Signed)
Refill printed and signed - nurse to complete.  Please remind patient of his appointment with me on November 26.

## 2011-03-20 NOTE — Telephone Encounter (Signed)
Pt informed Rx is ready,  Given new appointment date.

## 2011-03-20 NOTE — Progress Notes (Signed)
PPM check 

## 2011-04-10 ENCOUNTER — Other Ambulatory Visit: Payer: Self-pay | Admitting: *Deleted

## 2011-04-10 MED ORDER — MIRTAZAPINE 15 MG PO TABS: 15.0000 mg | ORAL_TABLET | Freq: Every day | ORAL | Status: DC

## 2011-04-15 ENCOUNTER — Ambulatory Visit: Payer: Medicare Other | Admitting: Internal Medicine

## 2011-04-17 ENCOUNTER — Other Ambulatory Visit: Payer: Self-pay | Admitting: *Deleted

## 2011-04-17 ENCOUNTER — Ambulatory Visit: Payer: Medicare Other | Admitting: Internal Medicine

## 2011-04-17 DIAGNOSIS — G8929 Other chronic pain: Secondary | ICD-10-CM

## 2011-04-17 DIAGNOSIS — M549 Dorsalgia, unspecified: Secondary | ICD-10-CM

## 2011-04-17 DIAGNOSIS — M1990 Unspecified osteoarthritis, unspecified site: Secondary | ICD-10-CM

## 2011-04-19 MED ORDER — OXYCODONE-ACETAMINOPHEN 10-650 MG PO TABS: 1.0000 | ORAL_TABLET | Freq: Four times a day (QID) | ORAL | Status: DC | PRN

## 2011-04-30 IMAGING — CR DG CHEST 1V PORT
1 series · 1 of 1 positions shown · non-contrast
Comparison: 08/16/2009

CLINICAL DATA: Chest pain

PORTABLE CHEST - 1 VIEW

[view not recorded]
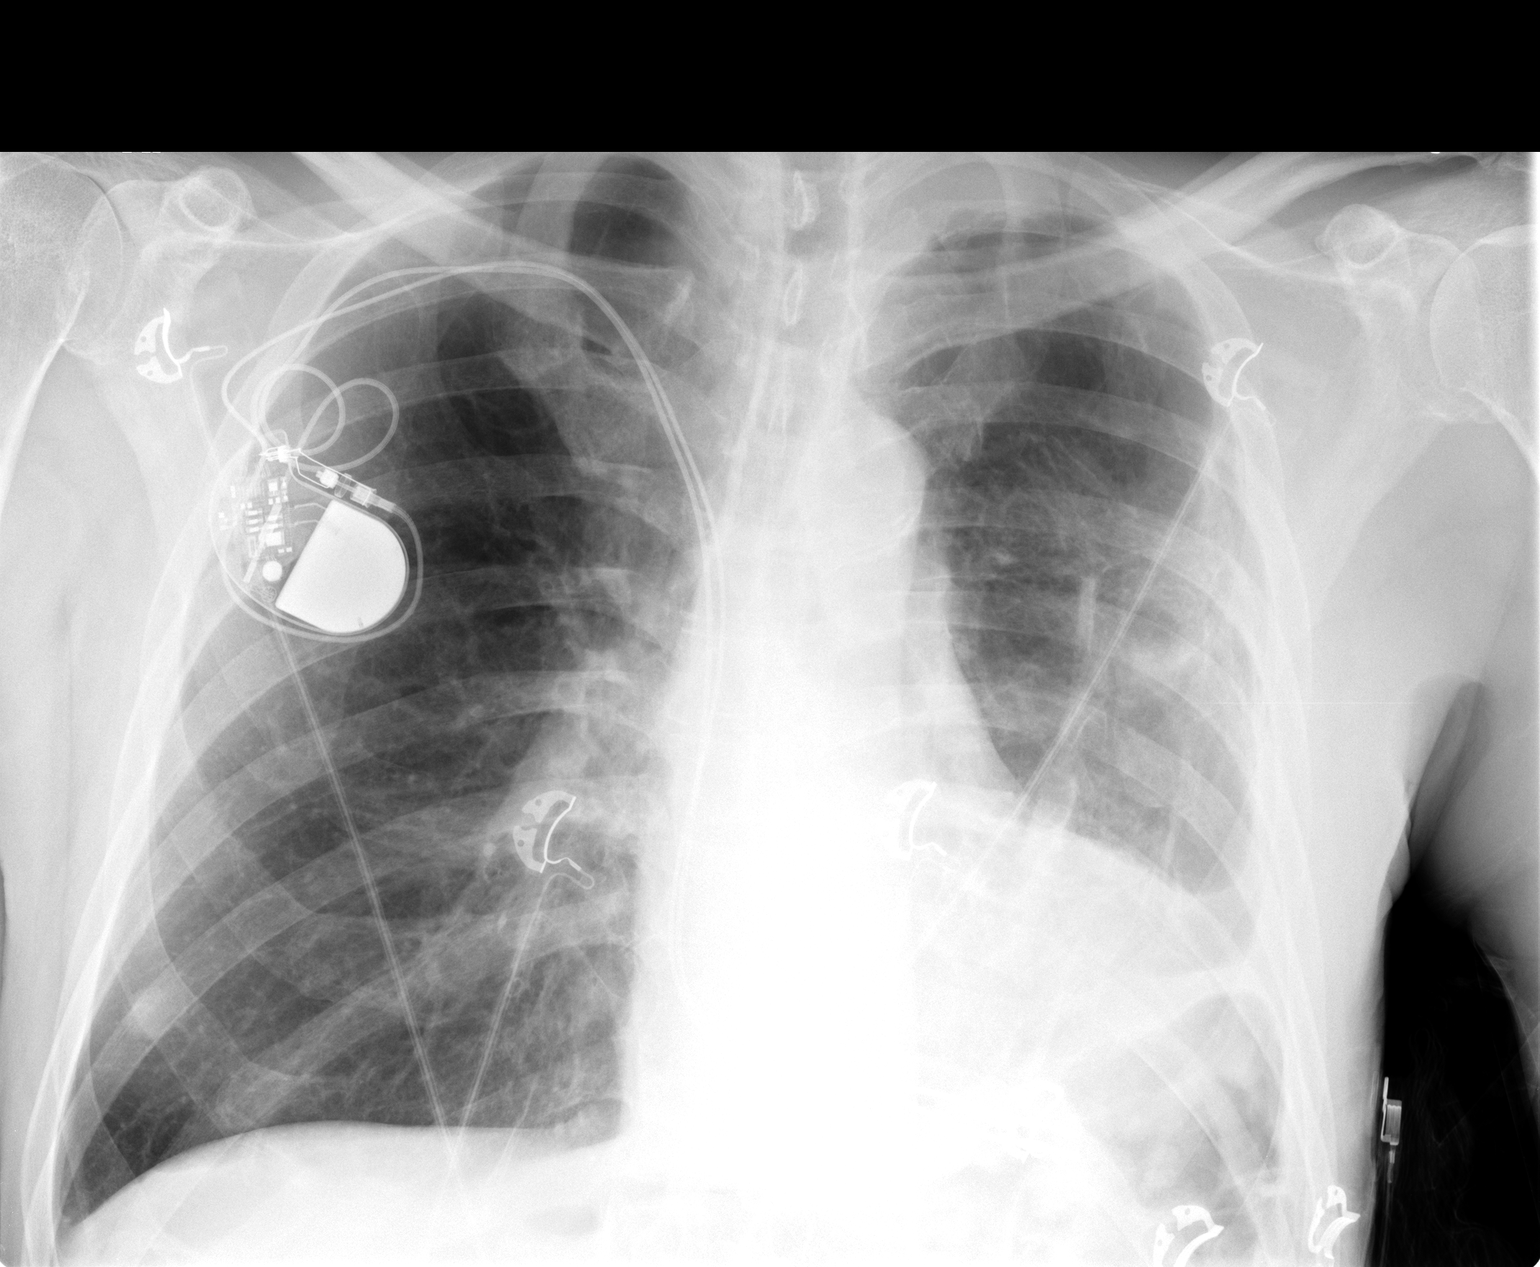

[1 of 1 positions shown; findings below may reference images not displayed]

FINDINGS: Heart size within normal limits for AP projection.
Permanent cardiac pacer unchanged.  Right lung clear with probable
nipple shadow.  Chronic left pleural and parenchymal scarring
without definite active disease.
IMPRESSION: Chronic changes as above.  No active disease in one-view.

## 2011-05-05 ENCOUNTER — Other Ambulatory Visit: Payer: Self-pay | Admitting: Internal Medicine

## 2011-05-22 ENCOUNTER — Other Ambulatory Visit: Payer: Self-pay | Admitting: *Deleted

## 2011-05-22 DIAGNOSIS — M1990 Unspecified osteoarthritis, unspecified site: Secondary | ICD-10-CM

## 2011-05-22 DIAGNOSIS — M549 Dorsalgia, unspecified: Secondary | ICD-10-CM

## 2011-05-22 DIAGNOSIS — G8929 Other chronic pain: Secondary | ICD-10-CM

## 2011-05-22 MED ORDER — OXYCODONE-ACETAMINOPHEN 10-650 MG PO TABS: 1.0000 | ORAL_TABLET | Freq: Four times a day (QID) | ORAL | Status: DC | PRN

## 2011-05-22 NOTE — Telephone Encounter (Signed)
Refill approved - nurse to complete. 

## 2011-05-22 NOTE — Telephone Encounter (Signed)
Pt will send fiancee to pic up, i have spoken to him concerning his appt and stressed the importance of keeping the appt, he is agreeable

## 2011-06-05 ENCOUNTER — Telehealth: Payer: Self-pay | Admitting: *Deleted

## 2011-06-05 ENCOUNTER — Ambulatory Visit: Payer: Medicare Other | Admitting: Internal Medicine

## 2011-06-05 NOTE — Telephone Encounter (Signed)
As per conversation with dr Meredith Pel last week, i have spoken to [name] re: appt, it was to be scheduled on an attending day of dr Meredith Pel, appt is scheduled fri 1/18 at 1015 per chilonb., it is with dr Loistine Chance and [name] is agreeable

## 2011-06-07 ENCOUNTER — Ambulatory Visit: Payer: Medicare Other | Admitting: Internal Medicine

## 2011-06-11 ENCOUNTER — Encounter: Payer: Self-pay | Admitting: Internal Medicine

## 2011-06-11 ENCOUNTER — Ambulatory Visit (INDEPENDENT_AMBULATORY_CARE_PROVIDER_SITE_OTHER): Payer: Medicare Other | Admitting: Internal Medicine

## 2011-06-11 VITALS — BP 134/89 | HR 87 | Temp 96.9°F | Ht 74.0 in | Wt 160.1 lb

## 2011-06-11 DIAGNOSIS — M549 Dorsalgia, unspecified: Secondary | ICD-10-CM

## 2011-06-11 DIAGNOSIS — I4891 Unspecified atrial fibrillation: Secondary | ICD-10-CM

## 2011-06-11 DIAGNOSIS — I10 Essential (primary) hypertension: Secondary | ICD-10-CM

## 2011-06-11 DIAGNOSIS — I5022 Chronic systolic (congestive) heart failure: Secondary | ICD-10-CM

## 2011-06-11 DIAGNOSIS — R05 Cough: Secondary | ICD-10-CM

## 2011-06-11 DIAGNOSIS — G8929 Other chronic pain: Secondary | ICD-10-CM

## 2011-06-11 DIAGNOSIS — J449 Chronic obstructive pulmonary disease, unspecified: Secondary | ICD-10-CM

## 2011-06-11 DIAGNOSIS — Z7901 Long term (current) use of anticoagulants: Secondary | ICD-10-CM

## 2011-06-11 DIAGNOSIS — F17200 Nicotine dependence, unspecified, uncomplicated: Secondary | ICD-10-CM

## 2011-06-11 DIAGNOSIS — R634 Abnormal weight loss: Secondary | ICD-10-CM

## 2011-06-11 DIAGNOSIS — I82409 Acute embolism and thrombosis of unspecified deep veins of unspecified lower extremity: Secondary | ICD-10-CM

## 2011-06-11 DIAGNOSIS — Z23 Encounter for immunization: Secondary | ICD-10-CM

## 2011-06-11 DIAGNOSIS — F319 Bipolar disorder, unspecified: Secondary | ICD-10-CM

## 2011-06-11 LAB — POCT INR: INR: 1

## 2011-06-11 MED ORDER — TIOTROPIUM BROMIDE MONOHYDRATE 18 MCG IN CAPS: 18.0000 ug | ORAL_CAPSULE | Freq: Every day | RESPIRATORY_TRACT | Status: DC

## 2011-06-11 MED ORDER — WARFARIN SODIUM 5 MG PO TABS: ORAL_TABLET | ORAL | Status: DC

## 2011-06-11 NOTE — Patient Instructions (Addendum)
Return to clinic in 4-6 weeks.  We will call you with an appointment to see Dr. Alexandria Lodge and with instructions for how to restart warfarin.   Total  Sun  12.5 mg (2 1/2 tablets) Mon  15 mg  (3 tablets) Tue  12.5 mg  (2 1/2 tablets) Wed 12.5 mg  (2 1/2 tablets) Thu   15 mg     (3 tablets) Fri     12.5 mg  (2 1/2 tablets) Sat    12.5 mg  (2 1/2 tablets)

## 2011-06-13 ENCOUNTER — Encounter: Payer: Self-pay | Admitting: Internal Medicine

## 2011-06-13 DIAGNOSIS — R059 Cough, unspecified: Secondary | ICD-10-CM | POA: Insufficient documentation

## 2011-06-13 DIAGNOSIS — R05 Cough: Secondary | ICD-10-CM | POA: Insufficient documentation

## 2011-06-13 NOTE — Assessment & Plan Note (Addendum)
Decreased appetite for over a year, down 4 lbs since July, but in general stable for the past 9 months.  Will continue to monitor.

## 2011-06-13 NOTE — Assessment & Plan Note (Signed)
Followed by Dr. Johney Frame cardiology

## 2011-06-13 NOTE — Assessment & Plan Note (Signed)
HR is either irregular or has frequent missed beats today.  Followed by cardiology, per records has been doing well on Multaq

## 2011-06-13 NOTE — Assessment & Plan Note (Signed)
Patient says his mood is doing well on Remeron.  Denies self-harm thoughts.  Last went to [facility] 3-4 yrs ago.  Stable for now.  Should any changes arise he should be referred back to [facility] or elsewhere to see psychiatry.

## 2011-06-13 NOTE — Assessment & Plan Note (Signed)
Smokes 1/2 ppd and doesn't want to quit.

## 2011-06-13 NOTE — Assessment & Plan Note (Signed)
5 months of cough productive of dark/brown phlegm.  Also has had night sweats and appetite loss over this time.  Weight relatively stable.  Will get CXRay.

## 2011-06-13 NOTE — Assessment & Plan Note (Signed)
BP ok today, no change to regimen.

## 2011-06-13 NOTE — Assessment & Plan Note (Signed)
Stopped taking Warfarin 1 week ago.  Has not seem Dr. Alexandria Lodge in a while.  INR 1.0 today.  Restarted most recent regimen and [facility] messaged Dr. Alexandria Lodge to have him advise on scheduling follow-up in anticoag clinic

## 2011-06-13 NOTE — Assessment & Plan Note (Signed)
Pain over buttocks down thigh bilaterally. Has had bilateral THA.  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  Has numbness/tingling/cold in feet, none of these in his hands.  On exam hamstrings/buttocks seem very tight on leg raise bilaterally.  Physical therapy may benefit him, so referral ordered. Pending physical therapy evaluation and efficacy, consider pain clinic referral for possible injection at follow-up visit with [place] in 4-6 weeks.

## 2011-06-13 NOTE — Assessment & Plan Note (Addendum)
Uses albuterol 3-4x per week.  Will add Spiriva to regimen to see if this helps him use PRN albuterol less.  Pt wishes to defer pneumovax until next visit

## 2011-06-13 NOTE — Progress Notes (Addendum)
Subjective:   Patient ID: Travis ISHAQ Sr. male   DOB: [birth year] 66 y.o.   MRN: [id number]  HPI: Travis F Jahn Franchini. is a 66 y.o. with afib, HTN, chronic pain, COPD, CHF, OA presents for follow-up.  He reports continued chronic pain over his buttocks and thighs bilaterally.  He does report having injections in back a few yrs ago that helped.  He reports bilateral THA.  Numbness and tingling present in his feet bilat but not his hands.  Pain 8-9/10.  He can only walk about 100 feet before having to stop.    He also had had decreased appetite for a year and a cough productive of brown sputum for 5 months.  He has also had night sweats often over this time.      Past Medical History  Diagnosis Date  . Atrial fibrillation with rapid ventricular response   . Hypertension   . Renal insufficiency     Renal U/S 12/04/2009 showed no pathological findings. Labs 12/04/2009 include normal ESR, C3, C4; neg ANA; SPEP showed nonspecific increase in the alpha-2 region with no M-spike; UPEP showed no monoclonal free light chains; urine IFE showed polyclonal increase in feree Kappa and/or free Lambda light chains.  . Chronic systolic heart failure   . Osteoarthritis   . Spondylosis   . Chronic pain   . Depression   . GERD (gastroesophageal reflux disease)   . Portal vein thrombosis   . Avascular necrosis     right hip s/p replacement  . 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 Gastritis   . Alcohol abuse   . Tobacco abuse   . Erectile dysfunction   . Bipolar disorder   . Hydrocele, unspecified   . Intermittent claudication   . Edema   . Lung nodule   . 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  Thyroid scan 08/28/2009 was normal with no focal areas of abnormal increased or decreased activity seen; the uptake of I 131 sodium iodide at 24 hours was 5.7%.  TSH and free T4 normalized by 08/16/2009.  [name] Pneumonia   . Intestinal  obstruction   . Chronic systolic heart failure   . Other primary cardiomyopathies     2D echo on 02/06/2010 showed normal LV size with mild global hypokinesis, estimated EF 45%; mild biatrial enlargement; mild pulmonary hypertension (PA systolic pressure 42 mmHg assuming RA pressure 10 mmHg); normal RV size with mild systolic dysfunction.  [name] COPD (chronic obstructive pulmonary disease)   . Atrial fibrillation     paroxysmal, failed medical therapy with amiodarone but has done very well with multaq  . Clostridium difficile colitis   . E coli bacteremia   . Cluster headaches   . DVT (deep venous thrombosis)     he has hypercoagulability with multiple occluded venous vessels   Current Outpatient Prescriptions  Medication Sig Dispense Refill  . albuterol (PROVENTIL HFA) 108 (90 BASE) MCG/ACT inhaler Inhale 2 puffs into the lungs every 6 (six) hours as needed for wheezing or shortness of breath.  1 Inhaler  6  . diltiazem (CARDIZEM CD) 240 MG 24 hr capsule Take 1 capsule (240 mg total) by mouth 2 (two) times daily.  60 capsule  6  . dronedarone (MULTAQ) 400 MG tablet Take 1 tablet (400 mg total) by mouth 2 (two) times daily with a meal.  60 tablet  6  . mirtazapine (REMERON) 15 MG tablet  Take 1 tablet (15 mg total) by mouth at bedtime.  30 tablet  3  . omeprazole (PRILOSEC) 20 MG capsule Take 2 capsules (40 mg total) by mouth daily.  60 capsule  6  . oxyCODONE-acetaminophen (PERCOCET) 10-650 MG per tablet Take 1 tablet by mouth every 6 (six) hours as needed for Pain.  120 tablet  0  . oxyCODONE-acetaminophen (PERCOCET) 10-650 MG per tablet Take 1 tablet by mouth every 6 (six) hours as needed for pain.  120 tablet  0  . ramelteon (ROZEREM) 8 MG tablet Take 1 tablet (8 mg total) by mouth at bedtime.  30 tablet  3  . tiotropium (SPIRIVA HANDIHALER) 18 MCG inhalation capsule Place 1 capsule (18 mcg total) into inhaler and inhale daily.  30 capsule  2  . warfarin (COUMADIN) 5 MG tablet Take as directed  by your physician  80 tablet  1   Current Facility-Administered Medications  Medication Dose Route Frequency Provider Last Rate Last Dose  . 0.9 %  sodium chloride infusion  500 mL Intravenous Continuous Hart Carwin, MD       Family History  Problem Relation Age of Onset  . Hypertension Mother   . Cancer Brother     Unsure of type.  . Asthma Mother   . Coronary artery disease Mother   . Colon cancer Neg Hx   . Lung cancer Neg Hx   . Prostate cancer Neg Hx    History   Social History  . Marital Status: Widowed    Spouse Name: N/A    Number of Children: 10  . Years of Education: N/A   Occupational History  . RETIRED    Social History Main Topics  . Smoking status: Current Everyday Smoker -- 0.5 packs/day for 40 years    Types: Cigarettes  . Smokeless tobacco: Never Used  . Alcohol Use: No     former heavy use-none since last year  . Drug Use: 1 per week    Special: Marijuana  . Sexually Active: None   Other Topics Concern  . None   Social History Narrative   Works as a Media planner part time   Review of Systems: Constitutional: Denies fever, chills, diaphoresis  Respiratory: Denies wheezing.   Cardiovascular: Denies leg swelling.  Gastrointestinal: Denies nausea, vomiting, abdominal pain, diarrhea, constipation, blood in stool and abdominal distention.  Genitourinary: Denies dysuria, urgency, frequency, hematuria, flank pain and difficulty urinating.  Skin: Denies pallor, rash and wound.  Neurological: Denies dizziness, seizures, syncope, light-headedness   Objective:  Physical Exam: Filed Vitals:   06/11/11 1506  BP: 134/89  Pulse: 87  Temp: 96.9 F (36.1 C)  TempSrc: Oral  Height: 6\' 2"  (1.88 m)  Weight: 160 lb 1.6 oz (72.621 kg)   Constitutional: Vital signs reviewed.  Patient is thin man who appears older than his stated age in no acute distress and cooperative with exam. Alert and oriented x3.  Head: Normocephalic and atraumatic Mouth: no  erythema or exudates, MMM Eyes: PERRL, EOMI, conjunctivae normal, No scleral icterus.  Neck: No JVD   Cardiovascular: normal rate, regular rhythm with skipped beats vs. Irregular rhythm., S1 normal, S2 normal, no MRG, pulses symmetric and intact bilaterally  Pulmonary/Chest: Diffusely rhonchorous on exam today.  Musculoskeletal: Tender to palpation diffusely over buttocks/lower back and down back of thighs.  No focal tenderness over spine.  Straight leg raise encounters a lot of resistance at only ~30 degrees of flexion and causes him a lot of  thigh pain bilaterally.  Neurological: A&O x3, Strength is normal and symmetric bilaterally, cranial nerve II-XII are grossly intact, no focal motor deficit, sensory intact to light touch bilaterally.  Skin: Warm, dry and intact. No rash, cyanosis, or clubbing.     Assessment & Plan:

## 2011-06-17 NOTE — Progress Notes (Signed)
agree

## 2011-06-19 ENCOUNTER — Other Ambulatory Visit: Payer: Self-pay | Admitting: *Deleted

## 2011-06-19 DIAGNOSIS — G8929 Other chronic pain: Secondary | ICD-10-CM

## 2011-06-19 DIAGNOSIS — M549 Dorsalgia, unspecified: Secondary | ICD-10-CM

## 2011-06-19 DIAGNOSIS — M1990 Unspecified osteoarthritis, unspecified site: Secondary | ICD-10-CM

## 2011-06-21 MED ORDER — OXYCODONE-ACETAMINOPHEN 10-650 MG PO TABS: 1.0000 | ORAL_TABLET | Freq: Four times a day (QID) | ORAL | Status: DC | PRN

## 2011-06-21 NOTE — Telephone Encounter (Signed)
Refill approved - nurse to complete. 

## 2011-06-21 NOTE — Telephone Encounter (Signed)
Pt informed

## 2011-06-26 ENCOUNTER — Ambulatory Visit: Payer: Medicare Other | Attending: Internal Medicine | Admitting: Physical Therapy

## 2011-06-26 DIAGNOSIS — R262 Difficulty in walking, not elsewhere classified: Secondary | ICD-10-CM | POA: Insufficient documentation

## 2011-06-26 DIAGNOSIS — M545 Low back pain, unspecified: Secondary | ICD-10-CM | POA: Insufficient documentation

## 2011-06-26 DIAGNOSIS — IMO0001 Reserved for inherently not codable concepts without codable children: Secondary | ICD-10-CM | POA: Insufficient documentation

## 2011-07-02 ENCOUNTER — Encounter: Payer: Medicare Other | Admitting: Physical Therapy

## 2011-07-04 ENCOUNTER — Ambulatory Visit: Payer: Medicare Other | Admitting: Physical Therapy

## 2011-07-09 ENCOUNTER — Ambulatory Visit (INDEPENDENT_AMBULATORY_CARE_PROVIDER_SITE_OTHER): Payer: Medicare Other | Admitting: Internal Medicine

## 2011-07-09 ENCOUNTER — Ambulatory Visit: Payer: Medicare Other | Admitting: Physical Therapy

## 2011-07-09 ENCOUNTER — Encounter: Payer: Self-pay | Admitting: Internal Medicine

## 2011-07-09 DIAGNOSIS — F329 Major depressive disorder, single episode, unspecified: Secondary | ICD-10-CM

## 2011-07-09 DIAGNOSIS — Z96649 Presence of unspecified artificial hip joint: Secondary | ICD-10-CM

## 2011-07-09 DIAGNOSIS — G8929 Other chronic pain: Secondary | ICD-10-CM

## 2011-07-09 NOTE — Patient Instructions (Signed)
Continue physical therapy We will try to schedule an appointment with your orthopedic doctor for your hip pain You will have to meet with Dr. Meredith Pel to discuss your pain medication refill and dosages.

## 2011-07-09 NOTE — Assessment & Plan Note (Signed)
Patient looks clincally depressed. He is denying SI or HI. He was diagnosed with major depression in 2004 and had inpatient psych admission at that time. He was discharged on zoloft at that time. He was being followed by psych 3-4 years ago but has been lost to follow up. He is currently not on any antidepressant except remeron for sleep. He does not remember what happened with other antidepressants that were previously started but he said that he has been on multiple antidepressants at different times. He is not willing to go to psychiatrist at this time because he wants his pain to be taken care of first.  He has multiple complaints like chronic pain, poor appetite, fatigue, difficulty sleeping and anhedonia and I think he needs to be on chronic antidepressant regimen and something more than Remeron. He wants to discuss this with his PCP at next visit.

## 2011-07-09 NOTE — Progress Notes (Signed)
Patient ID: Keymon Mcelroy., male   DOB: Mar 24, 1946, 66 y.o.   MRN: [id number]  Mr.SELIM DURDEN Sr. is a 66 y.o. with afib, HTN, chronic pain, COPD, CHF, OA presents for follow-up. He reports continued chronic pain over his buttocks and thighs bilaterally. He does report having injections in back a few yrs ago that helped. He reports bilateral THA. He takes about 5 percocets (10/650) a day. He is going to PT (2 sessions so far). He has not followed up with his orthopedic surgeon. He also complaints of wt loss and loss of appetite for past 6-7 years (he has lost 10 lbs in last 1 year documented)     General Appearance:     Filed Vitals:   07/09/11 1520  BP: 141/87  Pulse: 92  Temp: 97.7 F (36.5 C)  TempSrc: Oral  Height: 6\' 2"  (1.88 m)  Weight: 154 lb 14.4 oz (70.262 kg)     Alert, cooperative, no distress, appears stated age  Head:    Normocephalic, without obvious abnormality, atraumatic  Eyes:    PERRL, conjunctiva/corneas clear, EOM's intact, fundi    benign, both eyes       Neck:   Supple, symmetrical, trachea midline, no adenopathy;       thyroid:  No enlargement/tenderness/nodules; no carotid   bruit or JVD  Lungs:     Clear to auscultation bilaterally, respirations unlabored  Chest wall:    No tenderness or deformity  Heart:    Regular rate and rhythm, S1 and S2 normal, no murmur, rub   or gallop  Abdomen:     Soft, non-tender, bowel sounds active all four quadrants,    no masses, no organomegaly  Extremities:   Extremities normal, atraumatic, no cyanosis or edema  Pulses:   2+ and symmetric all extremities  Skin:   Skin color, texture, turgor normal, no rashes or lesions  Neurologic:  nonfocal grossly   ROS  Constitutional: Denies fever, chills, diaphoresis, has reduced appetite  and fatigue.  Respiratory: Denies SOB, DOE,chest tightness,  and wheezing.   Cardiovascular: Denies chest pain, palpitations and leg swelling.  Gastrointestinal: Denies nausea, vomiting,  abdominal pain, diarrhea, constipation, blood in stool and abdominal distention.  Skin: Denies pallor, rash and wound.  Neurological: Denies dizziness, light-headedness, numbness and headaches.

## 2011-07-09 NOTE — Assessment & Plan Note (Signed)
Patient request increasing his Percocet dose but I do not think he needs any more pain medicine to what he is already on. I also do not know what the pathology is that is causing him having so much pain after his hip replacement. I would refer him back to his orthopedic surgeon. He did not have any imaging studies after his hip replacement but I would defer choice of the test to his orthopedic surgeon. He has recently started physical therapy which I agree will benefit him in the long run. He was also on long-acting OxyContin at one time he didn't see in his med list. He does not remember  when and why he was taken off it. If he continues to be on chronic narcotic pain medication would be a good idea to restart it. He will need a pain contract as well

## 2011-07-11 ENCOUNTER — Ambulatory Visit: Payer: Medicare Other | Admitting: Physical Therapy

## 2011-07-15 ENCOUNTER — Ambulatory Visit: Payer: Medicare Other | Admitting: Physical Therapy

## 2011-07-16 ENCOUNTER — Other Ambulatory Visit: Payer: Self-pay | Admitting: *Deleted

## 2011-07-16 DIAGNOSIS — M549 Dorsalgia, unspecified: Secondary | ICD-10-CM

## 2011-07-16 DIAGNOSIS — G8929 Other chronic pain: Secondary | ICD-10-CM

## 2011-07-16 DIAGNOSIS — M1990 Unspecified osteoarthritis, unspecified site: Secondary | ICD-10-CM

## 2011-07-17 ENCOUNTER — Ambulatory Visit: Payer: Medicare Other | Admitting: Physical Therapy

## 2011-07-17 MED ORDER — OXYCODONE-ACETAMINOPHEN 10-650 MG PO TABS: 1.0000 | ORAL_TABLET | Freq: Four times a day (QID) | ORAL | Status: DC | PRN

## 2011-07-17 NOTE — Telephone Encounter (Signed)
Refill printed and signed - nurse to complete. 

## 2011-07-18 NOTE — Telephone Encounter (Signed)
Pt informed to pick up script fri3/1

## 2011-07-22 ENCOUNTER — Ambulatory Visit: Payer: Medicare Other | Admitting: Physical Therapy

## 2011-07-22 ENCOUNTER — Encounter: Payer: Medicare Other | Admitting: Physical Therapy

## 2011-07-24 ENCOUNTER — Encounter: Payer: Medicare Other | Admitting: Physical Therapy

## 2011-07-29 ENCOUNTER — Other Ambulatory Visit: Payer: Self-pay | Admitting: *Deleted

## 2011-07-29 MED ORDER — DRONEDARONE HCL 400 MG PO TABS: 400.0000 mg | ORAL_TABLET | Freq: Two times a day (BID) | ORAL | Status: DC

## 2011-08-14 ENCOUNTER — Ambulatory Visit (HOSPITAL_COMMUNITY)
Admission: RE | Admit: 2011-08-14 | Discharge: 2011-08-14 | Disposition: A | Discharge status: Home / Self Care | Payer: Medicare Other | Source: Ambulatory Visit | Attending: Internal Medicine | Admitting: Internal Medicine

## 2011-08-14 ENCOUNTER — Ambulatory Visit (INDEPENDENT_AMBULATORY_CARE_PROVIDER_SITE_OTHER): Payer: Medicare Other | Admitting: Internal Medicine

## 2011-08-14 ENCOUNTER — Encounter: Payer: Self-pay | Admitting: Internal Medicine

## 2011-08-14 VITALS — BP 122/75 | HR 70 | Temp 97.1°F | Ht 74.0 in | Wt 165.0 lb

## 2011-08-14 DIAGNOSIS — M25551 Pain in right hip: Secondary | ICD-10-CM

## 2011-08-14 DIAGNOSIS — Z79899 Other long term (current) drug therapy: Secondary | ICD-10-CM

## 2011-08-14 DIAGNOSIS — Z136 Encounter for screening for cardiovascular disorders: Secondary | ICD-10-CM

## 2011-08-14 DIAGNOSIS — Z23 Encounter for immunization: Secondary | ICD-10-CM

## 2011-08-14 DIAGNOSIS — N259 Disorder resulting from impaired renal tubular function, unspecified: Secondary | ICD-10-CM

## 2011-08-14 DIAGNOSIS — M1990 Unspecified osteoarthritis, unspecified site: Secondary | ICD-10-CM

## 2011-08-14 DIAGNOSIS — Z96649 Presence of unspecified artificial hip joint: Secondary | ICD-10-CM | POA: Insufficient documentation

## 2011-08-14 DIAGNOSIS — R634 Abnormal weight loss: Secondary | ICD-10-CM | POA: Insufficient documentation

## 2011-08-14 DIAGNOSIS — M25559 Pain in unspecified hip: Secondary | ICD-10-CM | POA: Insufficient documentation

## 2011-08-14 DIAGNOSIS — J449 Chronic obstructive pulmonary disease, unspecified: Secondary | ICD-10-CM

## 2011-08-14 DIAGNOSIS — G8929 Other chronic pain: Secondary | ICD-10-CM

## 2011-08-14 DIAGNOSIS — J4489 Other specified chronic obstructive pulmonary disease: Secondary | ICD-10-CM | POA: Insufficient documentation

## 2011-08-14 DIAGNOSIS — R05 Cough: Secondary | ICD-10-CM | POA: Insufficient documentation

## 2011-08-14 DIAGNOSIS — Z7901 Long term (current) use of anticoagulants: Secondary | ICD-10-CM

## 2011-08-14 DIAGNOSIS — R059 Cough, unspecified: Secondary | ICD-10-CM | POA: Insufficient documentation

## 2011-08-14 DIAGNOSIS — I10 Essential (primary) hypertension: Secondary | ICD-10-CM

## 2011-08-14 DIAGNOSIS — Z950 Presence of cardiac pacemaker: Secondary | ICD-10-CM | POA: Insufficient documentation

## 2011-08-14 DIAGNOSIS — R091 Pleurisy: Secondary | ICD-10-CM | POA: Insufficient documentation

## 2011-08-14 DIAGNOSIS — M549 Dorsalgia, unspecified: Secondary | ICD-10-CM

## 2011-08-14 LAB — CBC WITH DIFFERENTIAL/PLATELET
Eosinophils Relative: 3 % (ref 0–5)
HCT: 42.8 % (ref 39.0–52.0)
Hemoglobin: 13.7 g/dL (ref 13.0–17.0)
Lymphocytes Relative: 37 % (ref 12–46)
Lymphs Abs: 2.7 10*3/uL (ref 0.7–4.0)
MCV: 95.1 fL (ref 78.0–100.0)
Monocytes Absolute: 0.6 10*3/uL (ref 0.1–1.0)
Neutro Abs: 3.8 10*3/uL (ref 1.7–7.7)
RBC: 4.5 MIL/uL (ref 4.22–5.81)
WBC: 7.3 10*3/uL (ref 4.0–10.5)

## 2011-08-14 LAB — COMPLETE METABOLIC PANEL WITH GFR
ALT: 8 U/L (ref 0–53)
AST: 17 U/L (ref 0–37)
Albumin: 4 g/dL (ref 3.5–5.2)
Alkaline Phosphatase: 75 U/L (ref 39–117)
BUN: 24 mg/dL — ABNORMAL HIGH (ref 6–23)
CO2: 24 mEq/L (ref 19–32)
Calcium: 9.1 mg/dL (ref 8.4–10.5)
Chloride: 114 mEq/L — ABNORMAL HIGH (ref 96–112)
Creat: 1.38 mg/dL — ABNORMAL HIGH (ref 0.50–1.35)
GFR, Est African American: 61 mL/min
GFR, Est Non African American: 53 mL/min — ABNORMAL LOW
Glucose, Bld: 59 mg/dL — ABNORMAL LOW (ref 70–99)
Potassium: 4.1 mEq/L (ref 3.5–5.3)
Sodium: 145 mEq/L (ref 135–145)
Total Bilirubin: 0.5 mg/dL (ref 0.3–1.2)
Total Protein: 6.4 g/dL (ref 6.0–8.3)

## 2011-08-14 LAB — TSH: TSH: 1.07 u[IU]/mL (ref 0.350–4.500)

## 2011-08-14 LAB — POCT INR: INR: 0.9

## 2011-08-14 MED ORDER — OXYCODONE-ACETAMINOPHEN 10-650 MG PO TABS: 1.0000 | ORAL_TABLET | Freq: Four times a day (QID) | ORAL | Status: DC | PRN

## 2011-08-14 MED ORDER — TIOTROPIUM BROMIDE MONOHYDRATE 18 MCG IN CAPS: 18.0000 ug | ORAL_CAPSULE | Freq: Every day | RESPIRATORY_TRACT | Status: DC

## 2011-08-14 NOTE — Assessment & Plan Note (Signed)
Assessment: Patient is S/P right total hip arthroplasty by Dr. Renae Fickle on 03/08/2008 for avascular necrosis with pain and early osteoarthritis.  He reports gradual worsening of his chronic right hip pain over the past year.  The pain is reasonably well-controlled on current dose of oxycodone/acetaminophen.  Plan: Will obtain x-rays of the right hip today. I also advised patient to follow up with his orthopedic surgeon Dr. Renae Fickle; we will try to assist with arranging an appointment there.

## 2011-08-14 NOTE — Assessment & Plan Note (Signed)
Lab Results  Component Value Date   NA 145 11/19/2010   K 4.7 11/19/2010   CL 110 11/19/2010   CO2 27 11/19/2010   BUN 24* 11/19/2010   CREATININE 1.74* 11/19/2010    BP Readings from Last 3 Encounters:  08/14/11 122/75  07/09/11 141/87  06/11/11 134/89    Assessment: Hypertension control:  controlled  Progress toward goals:  at goal Barriers to meeting goals:  no barriers identified  Plan: Hypertension treatment:  continue current medications

## 2011-08-14 NOTE — Patient Instructions (Signed)
Please make an appointment with Dr. Alexandria Lodge in the anticoagulation clinic and follow up regularly with him. Please see your orthopedic surgeon regarding your right hip pain. Please have your Spiriva refilled and resume using that medication.

## 2011-08-14 NOTE — Assessment & Plan Note (Signed)
Wt Readings from Last 10 Encounters:  08/14/11 165 lb (74.844 kg)  07/09/11 154 lb 14.4 oz (70.262 kg)  06/11/11 160 lb 1.6 oz (72.621 kg)  11/19/10 164 lb 4.8 oz (74.526 kg)  11/06/10 162 lb (73.483 kg)  10/29/10 161 lb 6.4 oz (73.211 kg)  09/21/10 163 lb (73.936 kg)  09/04/10 161 lb 1.6 oz (73.074 kg)  08/15/10 154 lb 4.8 oz (69.99 kg)  08/08/10 151 lb 11.2 oz (68.811 kg)     Assessment: Prior workup for underlying cause of weight loss was unrevealing.  His appetite has improved to some extent, and his weight has improved from last visit.  Plan: Follow weight; I encouraged him to eat regular meals.  Given history of hyperthyroidism that was felt to be related to medication (and subsequently resolved), will check a TSH and free T4 today.

## 2011-08-14 NOTE — Assessment & Plan Note (Addendum)
Assessment: Patient reports chronic exertional dyspnea that is reasonably stable.  His lung exam is clear today.  He is using only an albuterol inhaler as needed; he has not recently been using Spiriva.   Plan: I advised patient to resume Spiriva, which I refilled today.  If he has persisting symptoms, then will consider adding Advair.  I strongly advised the patient to stop smoking; he reports that he has cut down, but he is not interested in assistance with smoking cessation using medications, since he wants to try quitting on his own.

## 2011-08-14 NOTE — Progress Notes (Signed)
  Subjective:    Patient ID: [name]., male    DOB: 1946-02-16, 66 y.o.   MRN: [id number]  HPI Patient returns for followup of his chronic right hip pain, hypertension, COPD, and other chronic medical problems.  His main complaint today is right hip pain which although chronic has gradually worsened over the past year.  He did not bring his medications to clinic today, but he did confirm his medication list.  He reports that he has missed several doses of his warfarin recently; he is currently not using Spiriva although this was on his medication list.  His appetite is somewhat better than before, although he feels that it remains decreased from baseline.    Review of Systems  Constitutional: Negative for fever, chills and diaphoresis.  Respiratory: Positive for shortness of breath (Exertional dyspnea) and wheezing (Occasional).   Cardiovascular: Negative for chest pain and leg swelling.  Gastrointestinal: Negative for nausea, vomiting, abdominal pain and blood in stool.  Genitourinary: Negative for dysuria, frequency and difficulty urinating.       Objective:   Physical Exam  Constitutional: No distress.  Cardiovascular: Normal rate, regular rhythm and normal heart sounds.  Exam reveals no gallop and no friction rub.   No murmur heard. Pulmonary/Chest: Effort normal and breath sounds normal. He has no wheezes. He has no rales.  Abdominal: Soft. Bowel sounds are normal. He exhibits no distension. There is no hepatosplenomegaly. There is no tenderness. There is no rebound and no guarding.  Musculoskeletal: He exhibits no edema.       Assessment & Plan:

## 2011-08-14 NOTE — Assessment & Plan Note (Signed)
  Lab Results  Component Value Date   CREATININE 1.74* 11/19/2010   CREATININE 1.3 09/21/2010   CREATININE 1.71* 08/08/2010   CREATININE 1.58* 07/18/2010   CREATININE 1.76* 07/04/2010   CREATININE 1.5 06/20/2010     Assessment: Patient has stable chronic renal insufficiency.  Plan: Will check labs today including a comprehensive metabolic panel and CBC with differential.

## 2011-08-25 IMAGING — US US RENAL
1 series · 14 of 25 positions shown · non-contrast
Comparison: 12/04/2009

CLINICAL DATA: Renal insufficiency

RENAL/URINARY TRACT ULTRASOUND COMPLETE

[Series 1: us renal · 0.30mm/px · 14 of 28 slices shown]
[im 1/28]
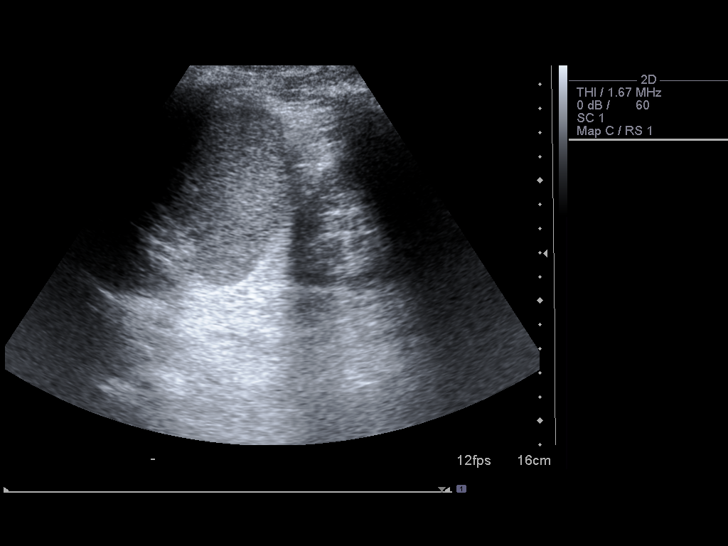
[im 3/28]
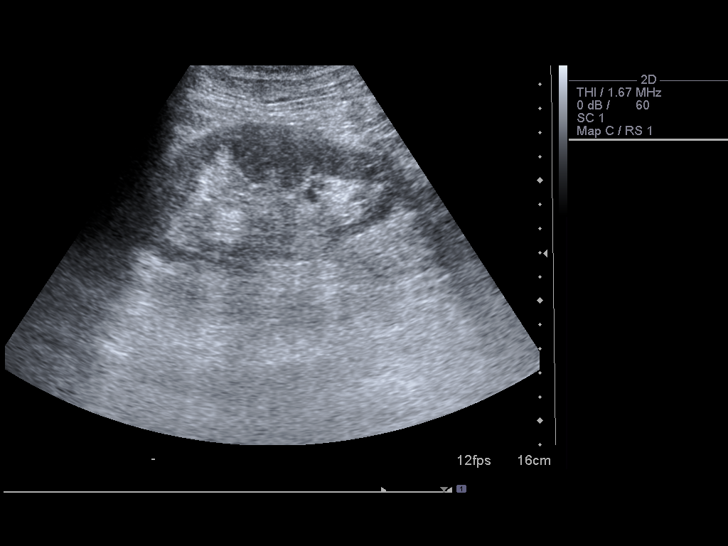
[im 5/28]
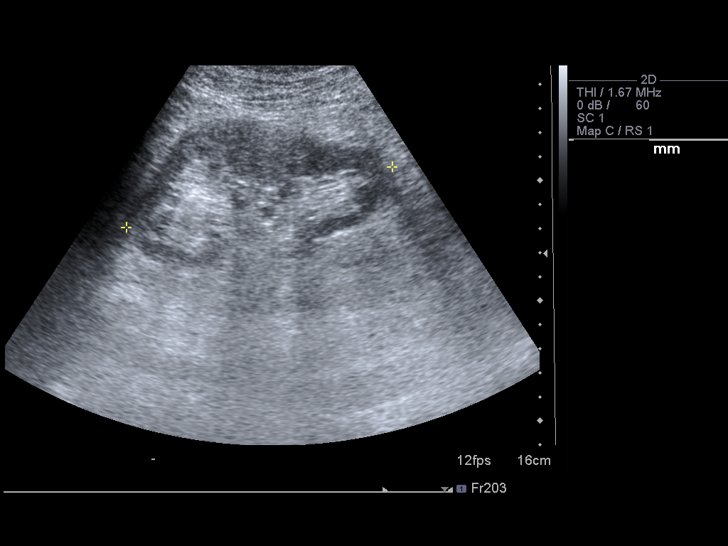
[im 7/28]
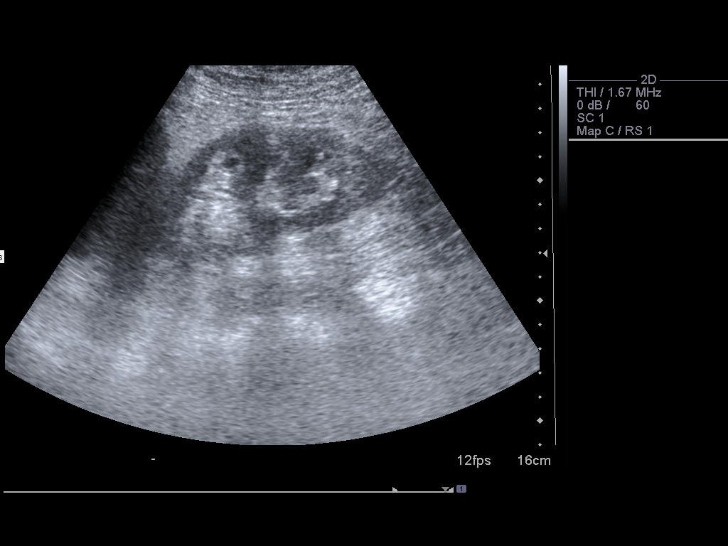
[im 10/28]
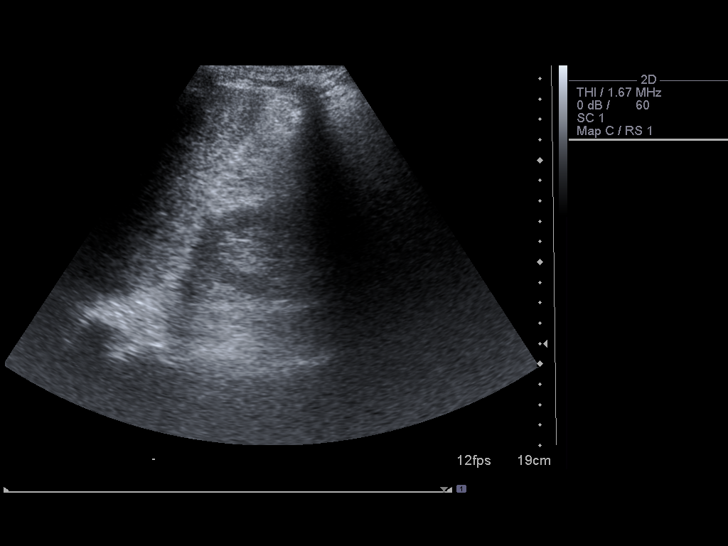
[im 11/28]
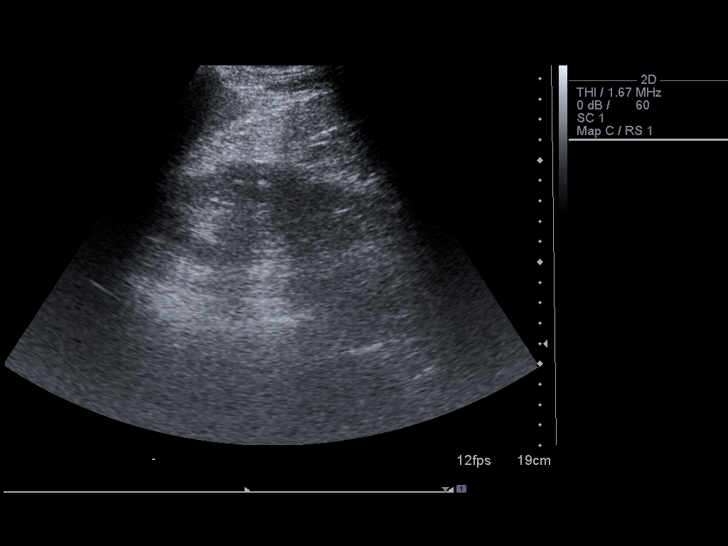
[im 13/28]
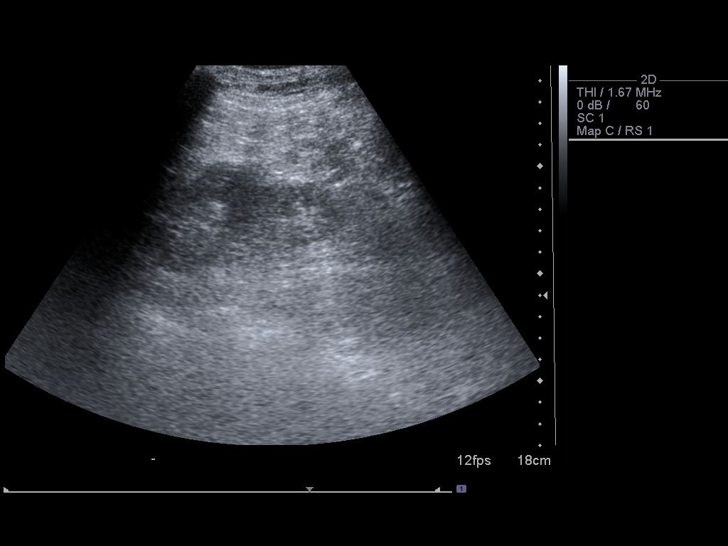
[im 15/28]
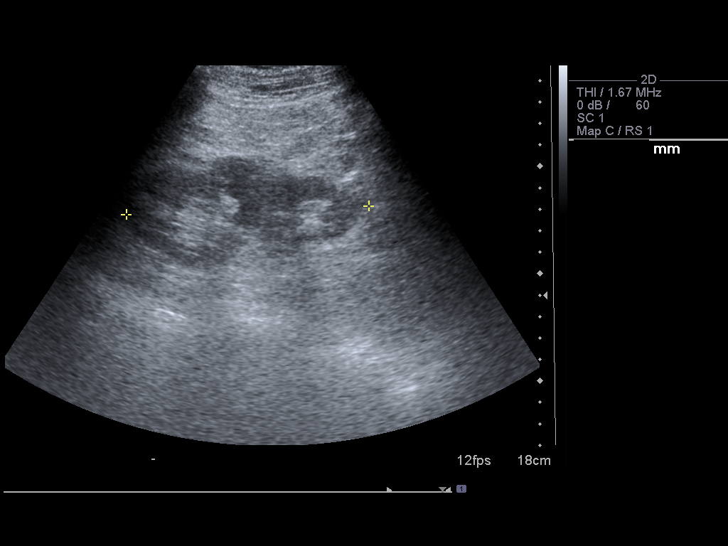
[im 17/28]
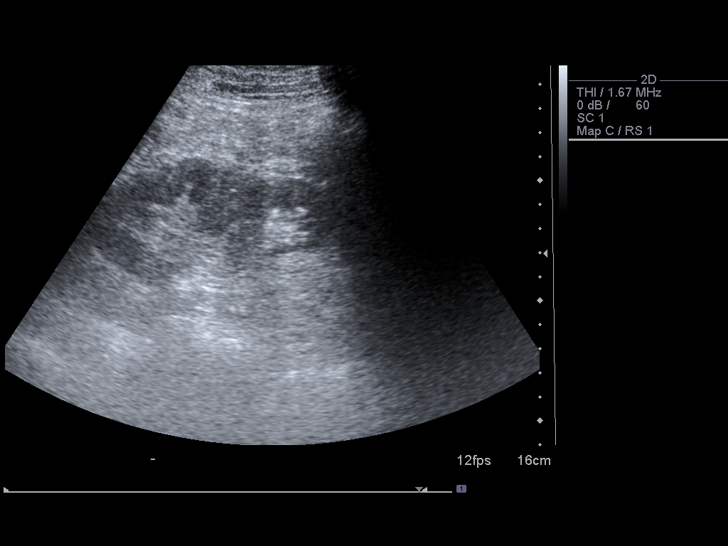
[im 19/28]
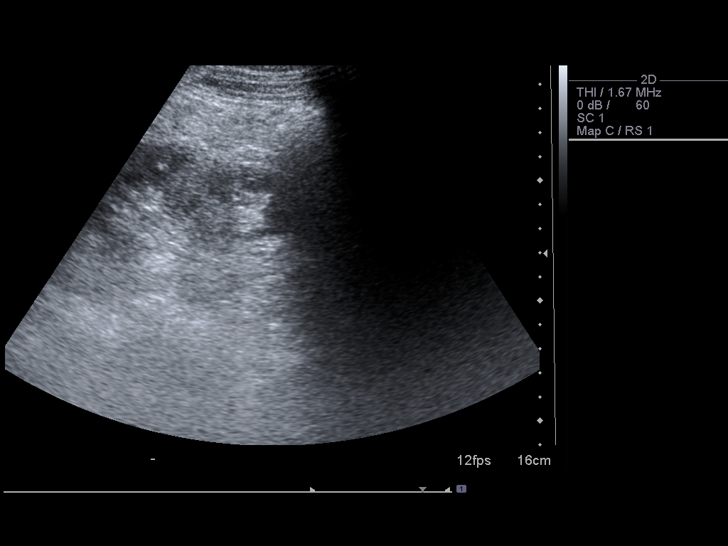
[im 21/28]
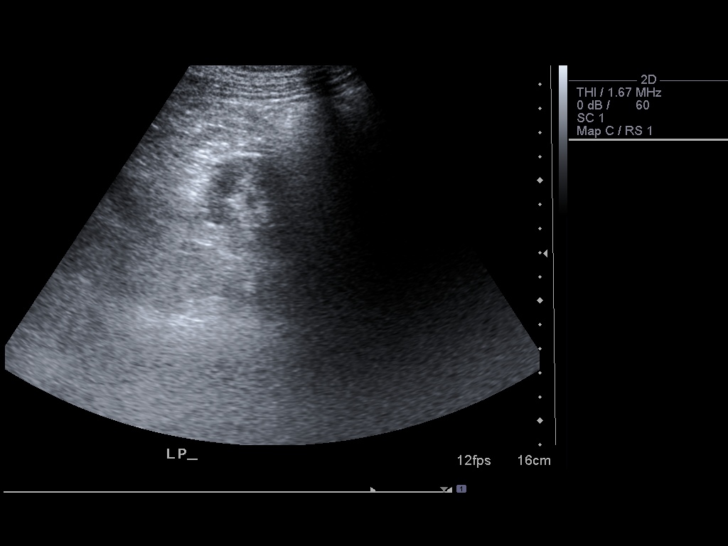
[im 23/28]
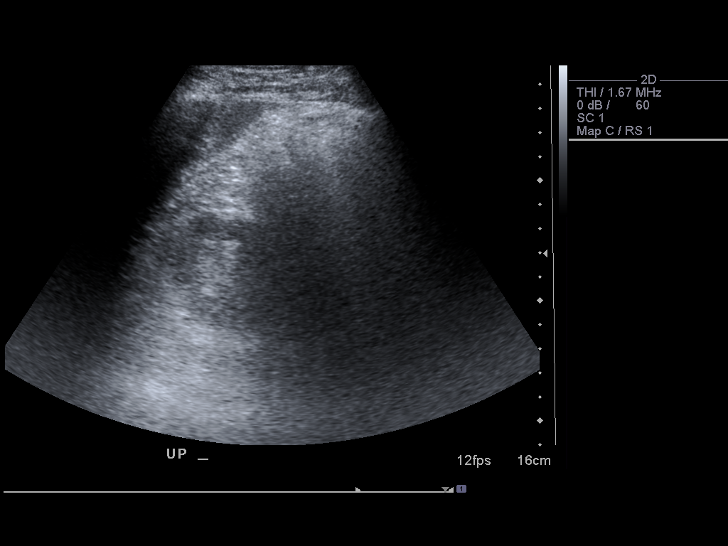
[im 25/28]
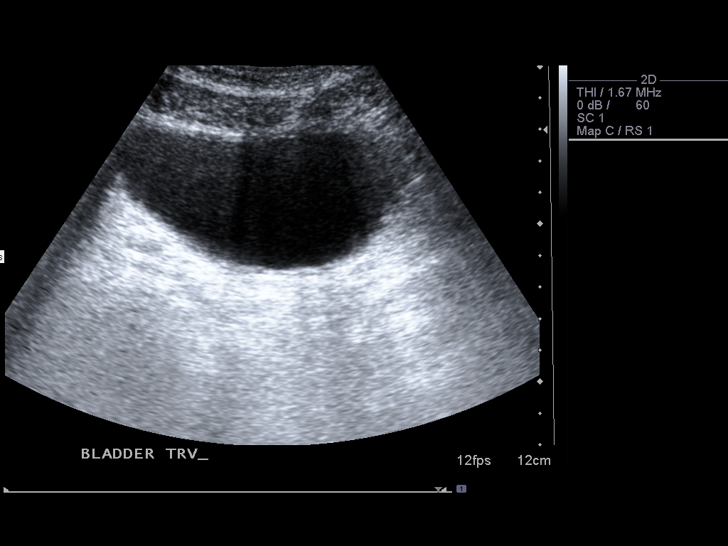
[im 28/28]
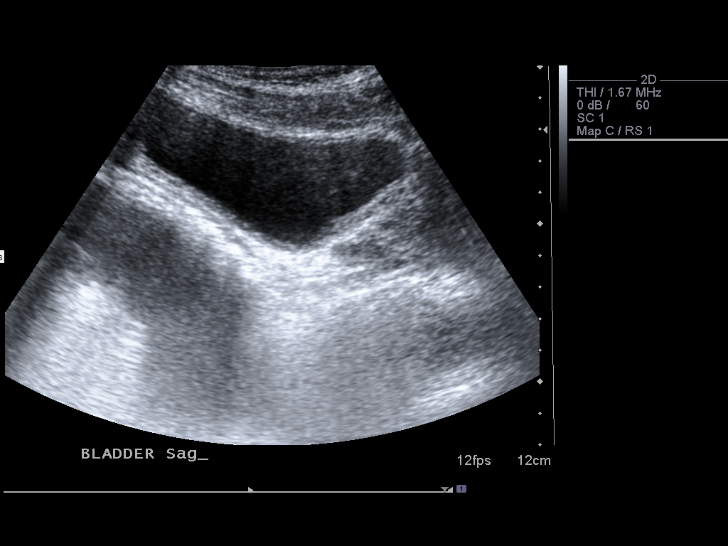

[14 of 25 positions shown; findings below may reference images not displayed]

FINDINGS: Right Kidney:  11.3 cm length.  Kidney measured 11.0 cm length on
previous exam.  Age-related cortical thinning.  No mass,
hydronephrosis shadowing calcification.  No perinephric fluid
collection.

Left Kidney:  11.4 cm length.  Kidney measured 11.1 cm length on
previous exam.  Age-related cortical thinning.  No mass,
hydronephrosis shadowing calcification.  No perinephric fluid
collection.

Bladder:  Unremarkable
IMPRESSION: Age-related renal cortical atrophy.
No acute abnormalities.

## 2011-08-25 IMAGING — CT CT ABD-PELV W/O CM
2 of 4 series · 15 of 46 positions shown, 17 images · non-contrast
Comparison: CT angio chest 12/14/2000.  CT abdomen and pelvis
12/22/2008.

CT CHEST

CLINICAL DATA: Unexplained weight loss.  Abnormal thickened ileal
small bowel loop previously.  Insufficiency precluded IV contrast
administration.

CT CHEST, ABDOMEN AND PELVIS WITHOUT CONTRAST 06/02/2010:
TECHNIQUE: Multidetector CT imaging of the chest, abdomen and
pelvis was performed following the standard protocol without IV
contrast.

[Series 2: c/a/p 5.0 b31f · axial · 0.71mm/px · z∈[-744,-89]mm · 12 of 145 slices shown, 14 images]
[im 7/145  soft-tissue]
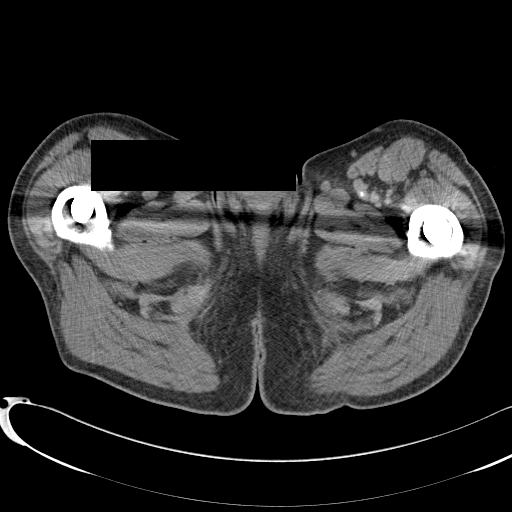
[im 7/145  bone]
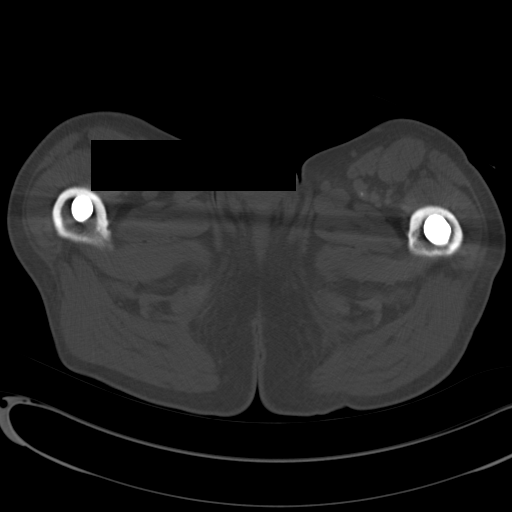
[im 20/145  soft-tissue]
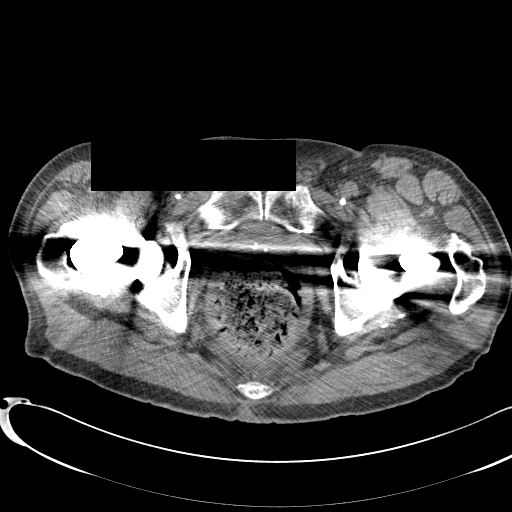
[im 40/145  soft-tissue]
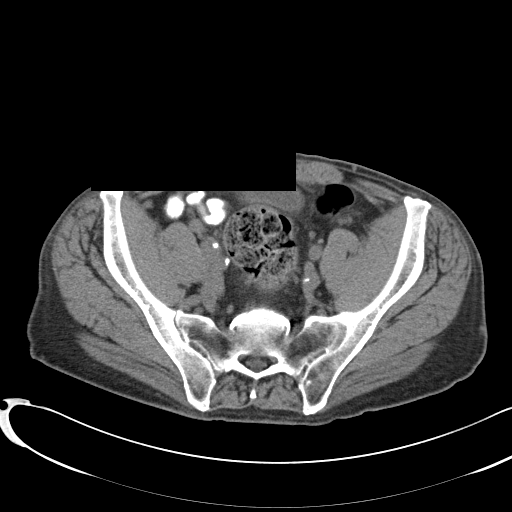
[im 46/145  soft-tissue]
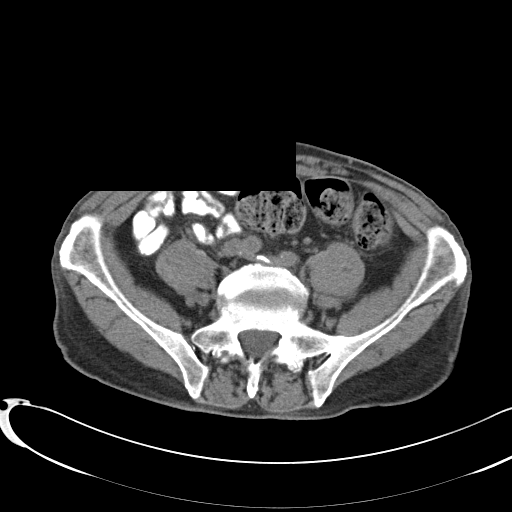
[im 59/145  soft-tissue]
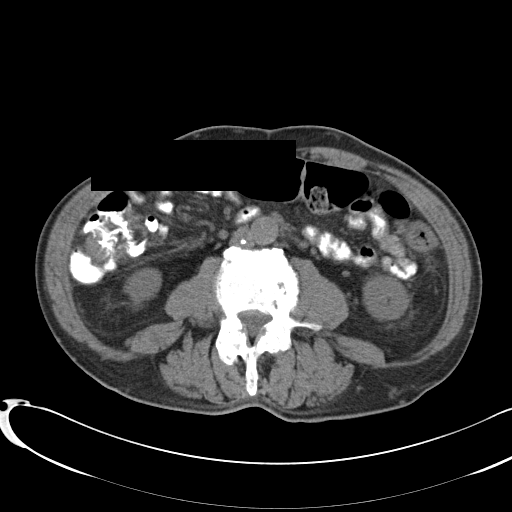
[im 73/145  soft-tissue]
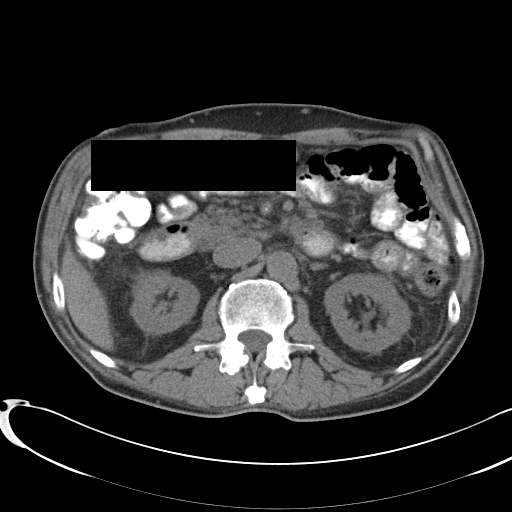
[im 79/145  soft-tissue]
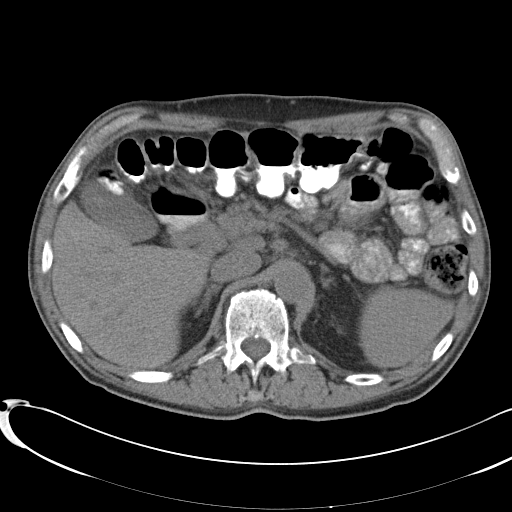
[im 92/145  soft-tissue]
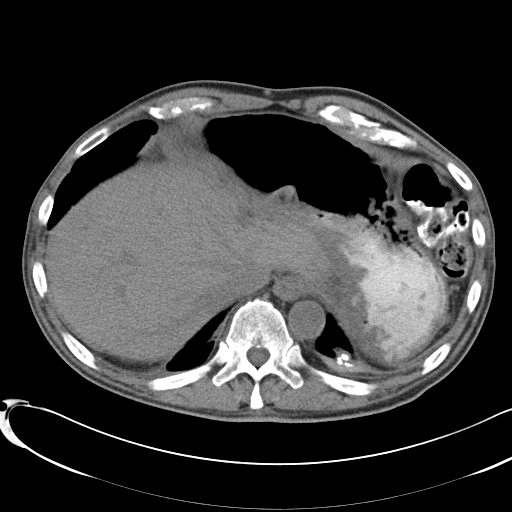
[im 105/145  soft-tissue]
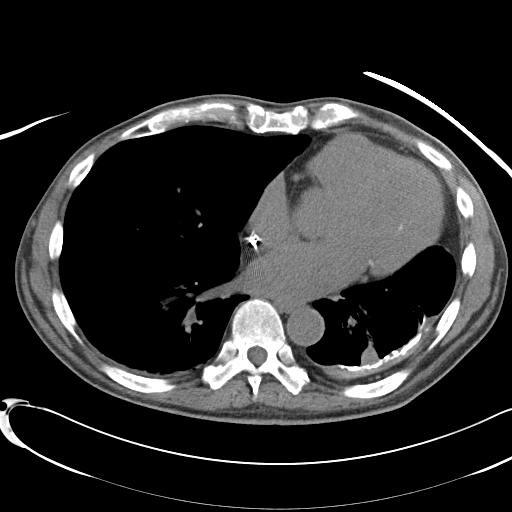
[im 105/145  bone]
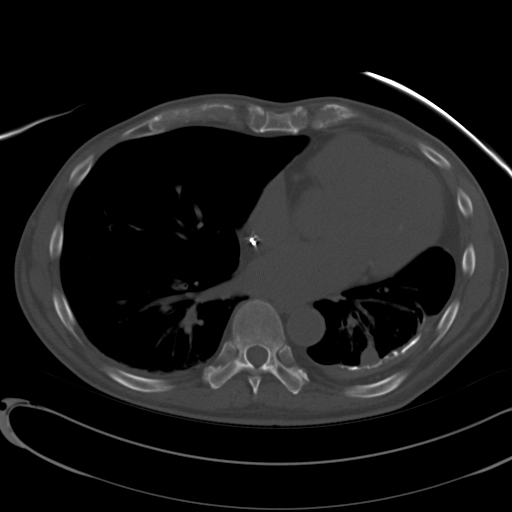
[im 112/145  soft-tissue]
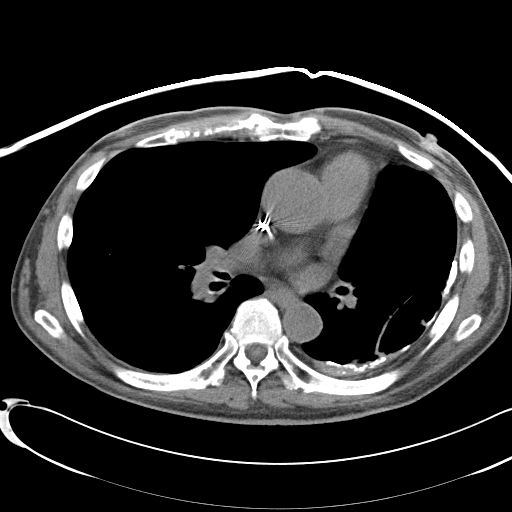
[im 125/145  soft-tissue]
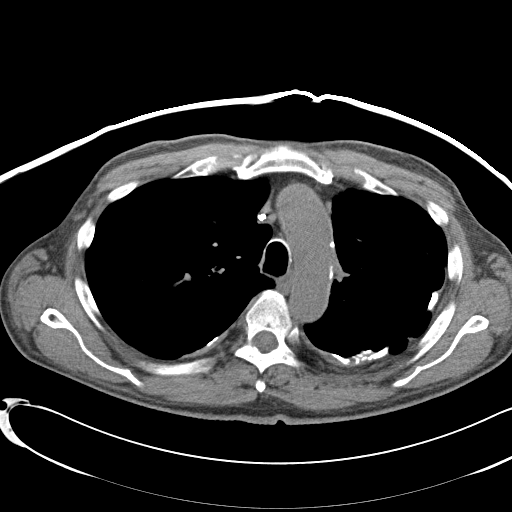
[im 138/145  soft-tissue]
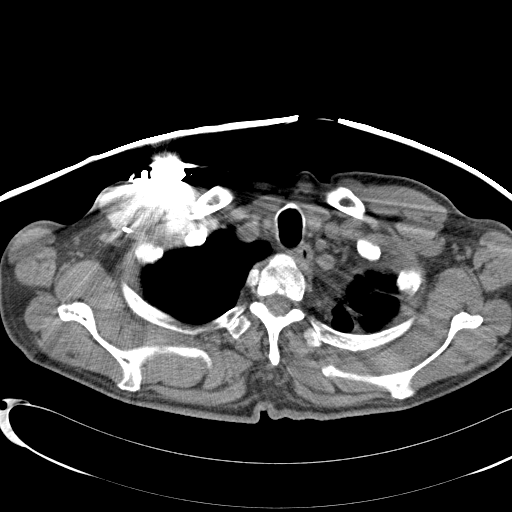

[Series 602: coronal cap · coronal · 1.41mm/px · 3 of 76 slices shown]
[im 26/76  soft-tissue]
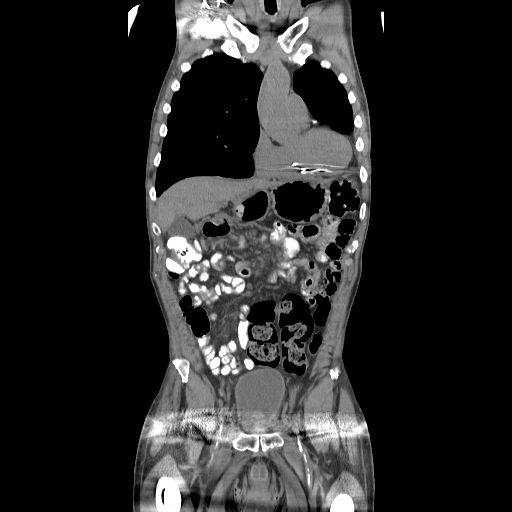
[im 34/76  soft-tissue]
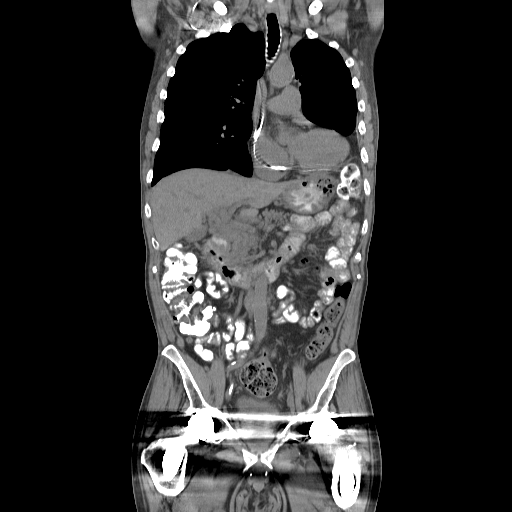
[im 42/76  soft-tissue]
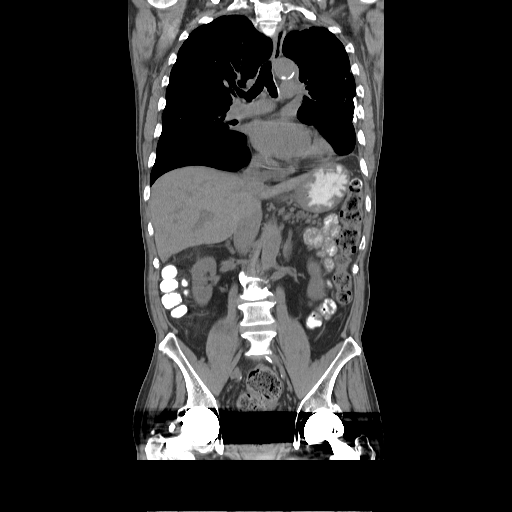

[15 of 46 positions shown; findings below may reference images not displayed]

FINDINGS: Emphysematous changes throughout both lungs.  Extensive
pleural thickening involving the left hemithorax with associated
pleural calcifications, unchanged.  Parenchymal scarring in the
left apex, left lower lobe, and lingula; some of this scarring at
the left base has a nodular appearance, unchanged.  Focal
bronchiectasis involving the right middle lobe, unchanged.  Mild
scarring deep in the right lower lobe, unchanged.  No new pulmonary
parenchymal nodules or masses.  No confluent airspace
consolidation.  No pleural effusions.

Right subclavian pacemaker with the lead tips at the RV apex,
unchanged.  Stable mild ectasia of the ascending thoracic aorta,
maximum diameter 4.3 cm at the level of the left main pulmonary
artery (image 32).  Stable cardiomegaly with left ventricular
predominance.  Moderate  LAD and left circumflex coronary artery
calcification.  No pericardial effusion.  Moderate atherosclerosis
involving the thoracic aorta.

Scattered normal-sized mediastinal, hilar, and axillary lymph
nodes; no significant lymphadenopathy.  Visualized thyroid gland
normal.  Bone window images demonstrate minimal thoracic
spondylosis.
IMPRESSION: 1.  COPD/emphysema.  Conglomerate scarring involving the left lower
lobe and lingula, with mild pleuroparenchymal scarring at the left
apex and in the deep right lower lobe.  No acute cardiopulmonary
disease.
2.  Stable cardiomegaly without evidence of pulmonary edema.
3.  Stable extensive calcified pleural plaques involving the left
hemithorax, consistent with asbestos related pleural disease.

CT ABDOMEN AND PELVIS
FINDINGS: Normal unenhanced appearance of the liver, spleen,
pancreas, adrenal glands, and kidneys.  Gallbladder unremarkable by
CT.  No biliary ductal dilation.  Stomach mildly distended with
oral contrast and gas, but otherwise unremarkable by CT.  Normal
appearing small bowel throughout the abdomen and pelvis;
specifically, no abnormally thickened small bowel loop as suggested
by history.  Large amount of stool within the rectum and sigmoid
colon, with marked circumferential thickening of the distal rectum;
remainder of the colon unremarkable.  Proximal appendix normal in
appearance, filling normally with oral contrast, though there is
dilation of the distal appendix up to approximately 9 mm without
periappendiceal inflammation; this is a new finding since the prior
CT.  Moderate to severe aorto-iliofemoral and visceral artery
atherosclerosis without aneurysm.  No significant lymphadenopathy.
No ascites.

Metallic beam hardening streak artifact from the bilateral hip
prostheses makes evaluation of the lower pelvis suboptimal.
Visualized urinary bladder unremarkable.  I cannot comment on the
prostate gland or seminal vesicles.  Bone window images demonstrate
degenerative disc disease and spondylosis at L3-4 with diffuse
facet degenerative changes throughout the lumbar spine.
IMPRESSION: 1.  Probable rectal fecal impaction.  Marked circumferential
thickening of the wall of the distal rectum just above the anal
verge, query proctitis versus rectal malignancy.  Please correlate
with physical examination.
2.  Possible chronic distal appendicitis.  Dilation of the distal
appendix up to 9 mm without periappendiceal inflammation, new since
December 2008.  No acute or significant abnormalities otherwise.  Specifically,
no evidence of thickening of the small bowel as was given in the
clinical history.

## 2011-08-26 ENCOUNTER — Ambulatory Visit (INDEPENDENT_AMBULATORY_CARE_PROVIDER_SITE_OTHER): Payer: Medicare Other | Admitting: Pharmacist

## 2011-08-26 DIAGNOSIS — I81 Portal vein thrombosis: Secondary | ICD-10-CM

## 2011-08-26 DIAGNOSIS — Z7901 Long term (current) use of anticoagulants: Secondary | ICD-10-CM

## 2011-08-26 LAB — POCT INR: INR: 3

## 2011-08-26 NOTE — Progress Notes (Signed)
Chart and INR reviewed.  Agree with Dr. Groce's assessment and plan. 

## 2011-08-26 NOTE — Patient Instructions (Signed)
Patient instructed to take medications as defined in the Anti-coagulation Track section of this encounter.  Patient instructed to OMIT today's dose.  Patient verbalized understanding of these instructions.    

## 2011-08-26 NOTE — Progress Notes (Signed)
Anti-Coagulation Progress Note  [name]. is a 66 y.o. male who is currently on an anti-coagulation regimen.    RECENT RESULTS: Recent results are below, the most recent result is correlated with a dose of 92.5 mg. per week: Lab Results  Component Value Date   INR 3.0 08/26/2011   INR 0.9 08/14/2011   INR 1.0 06/11/2011    ANTI-COAG DOSE:   Latest dosing instructions   Total Sun Mon Tue Wed Thu Fri Sat   90 12.5 mg 12.5 mg 12.5 mg 15 mg 12.5 mg 12.5 mg 12.5 mg    (5 mg2.5) (5 mg2.5) (5 mg2.5) (5 mg3) (5 mg2.5) (5 mg2.5) (5 mg2.5)         ANTICOAG SUMMARY: Anticoagulation Episode Summary              Current INR goal  Next INR check 09/16/2011   INR from last check 3.0 (08/26/2011)     Weekly max dose (mg)  Target end date    Indications Long-term (current) use of anticoagulants, Atrila fibrillation (Resolved), Thrombosis, portal vein   INR check location  Preferred lab    Send INR reminders to ANTICOAG IMP   Comments        Provider Role Specialty Phone number   [name], MD  Internal Medicine [phone]        ANTICOAG TODAY: Anticoagulation Summary as of 08/26/2011              INR goal      Selected INR 3.0 (08/26/2011) Next INR check 09/16/2011   Weekly max dose (mg)  Target end date    Indications Long-term (current) use of anticoagulants, Atrila fibrillation (Resolved), Thrombosis, portal vein    Anticoagulation Episode Summary              INR check location  Preferred lab    Send INR reminders to ANTICOAG IMP   Comments        Provider Role Specialty Phone number   [name], MD  Internal Medicine [phone]        PATIENT INSTRUCTIONS: Patient Instructions  Patient instructed to take medications as defined in the Anti-coagulation Track section of this encounter.  Patient instructed to OMIT today's dose.  Patient verbalized understanding of these instructions.        FOLLOW-UP Return in 3 weeks (on 09/16/2011) for  Follow up INR.  [name], III Pharm.D., CACP

## 2011-09-16 ENCOUNTER — Ambulatory Visit (INDEPENDENT_AMBULATORY_CARE_PROVIDER_SITE_OTHER): Payer: Medicare Other | Admitting: Pharmacist

## 2011-09-16 ENCOUNTER — Encounter: Payer: Self-pay | Admitting: Internal Medicine

## 2011-09-16 ENCOUNTER — Ambulatory Visit (INDEPENDENT_AMBULATORY_CARE_PROVIDER_SITE_OTHER): Payer: Medicare Other | Admitting: Internal Medicine

## 2011-09-16 ENCOUNTER — Other Ambulatory Visit: Payer: Self-pay | Admitting: *Deleted

## 2011-09-16 ENCOUNTER — Other Ambulatory Visit: Payer: Self-pay | Admitting: Internal Medicine

## 2011-09-16 VITALS — BP 115/68 | HR 75 | Ht 75.6 in | Wt 159.0 lb

## 2011-09-16 DIAGNOSIS — I81 Portal vein thrombosis: Secondary | ICD-10-CM

## 2011-09-16 DIAGNOSIS — I10 Essential (primary) hypertension: Secondary | ICD-10-CM

## 2011-09-16 DIAGNOSIS — I4891 Unspecified atrial fibrillation: Secondary | ICD-10-CM

## 2011-09-16 DIAGNOSIS — Z7901 Long term (current) use of anticoagulants: Secondary | ICD-10-CM

## 2011-09-16 DIAGNOSIS — G8929 Other chronic pain: Secondary | ICD-10-CM

## 2011-09-16 DIAGNOSIS — M549 Dorsalgia, unspecified: Secondary | ICD-10-CM

## 2011-09-16 DIAGNOSIS — F17200 Nicotine dependence, unspecified, uncomplicated: Secondary | ICD-10-CM

## 2011-09-16 DIAGNOSIS — I495 Sick sinus syndrome: Secondary | ICD-10-CM

## 2011-09-16 DIAGNOSIS — M1990 Unspecified osteoarthritis, unspecified site: Secondary | ICD-10-CM

## 2011-09-16 LAB — PACEMAKER DEVICE OBSERVATION
BATTERY VOLTAGE: 2.8 V
RV LEAD IMPEDENCE PM: 400 Ohm
RV LEAD THRESHOLD: 0.75 V

## 2011-09-16 MED ORDER — OXYCODONE-ACETAMINOPHEN 10-650 MG PO TABS: 1.0000 | ORAL_TABLET | Freq: Four times a day (QID) | ORAL | Status: DC | PRN

## 2011-09-16 NOTE — Assessment & Plan Note (Signed)
Doing very well with multaq No changes today Continue coumadin long term  LFTS 3/13 were reviewed and normal

## 2011-09-16 NOTE — Progress Notes (Signed)
Anti-Coagulation Progress Note  [name]. is a 66 y.o. male who is currently on an anti-coagulation regimen.    RECENT RESULTS: Recent results are below, the most recent result is correlated with a dose of 90 mg. per week: Lab Results  Component Value Date   INR 5.30 09/16/2011   INR 3.0 08/26/2011   INR 0.9 08/14/2011    ANTI-COAG DOSE:   Latest dosing instructions   Total Sun Mon Tue Wed Thu Fri Sat   85 12.5 mg 10 mg 12.5 mg 15 mg 10 mg 12.5 mg 12.5 mg    (5 mg2.5) (5 mg2) (5 mg2.5) (5 mg3) (5 mg2) (5 mg2.5) (5 mg2.5)         ANTICOAG SUMMARY: Anticoagulation Episode Summary              Current INR goal  Next INR check 09/30/2011   INR from last check 5.30 (09/16/2011)     Weekly max dose (mg)  Target end date    Indications Long-term (current) use of anticoagulants, Atrila fibrillation (Resolved), Thrombosis, portal vein   INR check location  Preferred lab    Send INR reminders to ANTICOAG IMP   Comments        Provider Role Specialty Phone number   [name], MD  Internal Medicine [phone]        ANTICOAG TODAY: Anticoagulation Summary as of 09/16/2011              INR goal      Selected INR 5.30 (09/16/2011) Next INR check 09/30/2011   Weekly max dose (mg)  Target end date    Indications Long-term (current) use of anticoagulants, Atrila fibrillation (Resolved), Thrombosis, portal vein    Anticoagulation Episode Summary              INR check location  Preferred lab    Send INR reminders to ANTICOAG IMP   Comments        Provider Role Specialty Phone number   [name], MD  Internal Medicine [phone]        PATIENT INSTRUCTIONS: Patient Instructions  Patient instructed to take medications as defined in the Anti-coagulation Track section of this encounter.  Patient instructed to OMIT/HOLD today's dose.  Patient verbalized understanding of these instructions.        FOLLOW-UP Return in 2 weeks (on 09/30/2011) for  Follow up INR.  [name], III Pharm.D., CACP

## 2011-09-16 NOTE — Assessment & Plan Note (Signed)
Normal pacemaker function See Pace Art report No changes today  

## 2011-09-16 NOTE — Patient Instructions (Signed)
Patient instructed to take medications as defined in the Anti-coagulation Track section of this encounter.  Patient instructed to OMIT/HOLD today's dose.  Patient verbalized understanding of these instructions.    

## 2011-09-16 NOTE — Assessment & Plan Note (Signed)
Stable No change required today  

## 2011-09-16 NOTE — Patient Instructions (Signed)
Your physician wants you to follow-up in: 1 year with Dr Allred.  You will receive a reminder letter in the mail two months in advance. If you don't receive a letter, please call our office to schedule the follow-up appointment.  

## 2011-09-16 NOTE — Assessment & Plan Note (Signed)
Cessation advised He is not ready to quit 

## 2011-09-16 NOTE — Telephone Encounter (Signed)
Pt is here at the clinic now and wants to know if he can pick up rx.

## 2011-09-16 NOTE — Progress Notes (Signed)
PCP: [name], MD, MD  [name]. is a 66 y.o. male who presents today for routine electrophysiology followup.  Since last being seen in our clinic, the patient reports doing very well.  He feels that he has been maintaining sinus rhythm.  Today, he denies symptoms of palpitations, exertional chest pain, shortness of breath (above baseline),  lower extremity edema, dizziness, presyncope, or syncope.  The patient is otherwise without complaint today.   Past Medical History  Diagnosis Date  . Atrial fibrillation with rapid ventricular response   . Hypertension   . Renal insufficiency     Renal U/S 12/04/2009 showed no pathological findings. Labs 12/04/2009 include normal ESR, C3, C4; neg ANA; SPEP showed nonspecific increase in the alpha-2 region with no M-spike; UPEP showed no monoclonal free light chains; urine IFE showed polyclonal increase in feree Kappa and/or free Lambda light chains.  . Chronic systolic heart failure   . Osteoarthritis   . Spondylosis   . Chronic pain   . Depression   . GERD (gastroesophageal reflux disease)   . Portal vein thrombosis   . Avascular necrosis     right hip s/p replacement  . 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 Gastritis   . Alcohol abuse   . Tobacco abuse   . Erectile dysfunction   . Bipolar disorder   . Hydrocele, unspecified   . Intermittent claudication   . Edema   . Lung nodule     Chest CT scan on 12/14/2008 showed a nodular opacity at the left lung base felt to most likely represent scarring.  Follow-up chest CT scan on 06/02/2010 showed parenchymal scarring in the left apex, left  lower lobe, and lingula; some of this scarring at the left base had a nodular appearance, unchanged.  . 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  Thyroid scan 08/28/2009 was normal with no focal areas of abnormal increased or decreased activity seen; the uptake of I 131 sodium iodide  at 24 hours was 5.7%.  TSH and free T4 normalized by 08/16/2009.  [name] Pneumonia   . Intestinal obstruction   . Chronic systolic heart failure   . Other primary cardiomyopathies     2D echo on 02/06/2010 showed normal LV size with mild global hypokinesis, estimated EF 45%; mild biatrial enlargement; mild pulmonary hypertension (PA systolic pressure 42 mmHg assuming RA pressure 10 mmHg); normal RV size with mild systolic dysfunction.  [name] COPD (chronic obstructive pulmonary disease)   . Atrial fibrillation     Paroxysmal, failed medical therapy with amiodarone but has done very well with multaq  . Clostridium difficile colitis   . E coli bacteremia   . Cluster headaches   . DVT (deep venous thrombosis)     He has hypercoagulability with multiple occluded venous vessels  . Pleural plaque     Chest CT on 06/02/2010 showed stable extensive calcified pleural plaques involving the left hemithorax, consistent with asbestos related pleural disease.  . Weight loss     Normal colonoscopy by Dr. [name] on 11/06/2010.   Past Surgical History  Procedure Date  . Left hip bipolar hemiarthroplasty 09/09/2002    By Dr. Kristeen Miss  . Right total hip arthroplasty 03/08/2008    By Dr. Charlesetta Shanks  . Pacemaker insertion October 2004    By Dr. Charolette Child, gen change by [facility]    Current Outpatient Prescriptions  Medication Sig Dispense Refill  .  albuterol (PROVENTIL HFA) 108 (90 BASE) MCG/ACT inhaler Inhale 2 puffs into the lungs every 6 (six) hours as needed for wheezing or shortness of breath.  1 Inhaler  6  . diltiazem (CARDIZEM CD) 240 MG 24 hr capsule Take 1 capsule (240 mg total) by mouth 2 (two) times daily.  60 capsule  6  . dronedarone (MULTAQ) 400 MG tablet Take 1 tablet (400 mg total) by mouth 2 (two) times daily with a meal.  60 tablet  6  . mirtazapine (REMERON) 15 MG tablet Take 1 tablet (15 mg total) by mouth at bedtime.  30 tablet  3  . omeprazole (PRILOSEC) 20 MG capsule Take 2 capsules (40  mg total) by mouth daily.  60 capsule  6  . oxyCODONE-acetaminophen (PERCOCET) 10-650 MG per tablet Take 1 tablet by mouth every 6 (six) hours as needed for pain.  120 tablet  0  . tiotropium (SPIRIVA HANDIHALER) 18 MCG inhalation capsule Place 1 capsule (18 mcg total) into inhaler and inhale daily.  30 capsule  6  . warfarin (COUMADIN) 5 MG tablet Take as directed by your physician  80 tablet  1  . DISCONTD: oxyCODONE-acetaminophen (PERCOCET) 10-650 MG per tablet Take 1 tablet by mouth every 6 (six) hours as needed for pain.  120 tablet  0    Physical Exam: Filed Vitals:   09/16/11 1150  BP: 115/68  Pulse: 75  Height: 6' 3.6" (1.92 m)  Weight: 159 lb (72.122 kg)    GEN- The patient is well appearing, alert and oriented x 3 today.   Head- normocephalic, atraumatic Eyes-  Sclera clear, conjunctiva pink Ears- hearing intact Oropharynx- clear Lungs- Clear to ausculation bilaterally, normal work of breathing Chest- pacemaker pocket is well healed Heart- Regular rate and rhythm, no murmurs, rubs or gallops, PMI not laterally displaced GI- soft, NT, ND, + BS Extremities- no clubbing, cyanosis, or edema  Pacemaker interrogation- reviewed in detail today,  See PACEART report  Assessment and Plan:

## 2011-09-30 ENCOUNTER — Ambulatory Visit (INDEPENDENT_AMBULATORY_CARE_PROVIDER_SITE_OTHER): Payer: Medicare Other | Admitting: Pharmacist

## 2011-09-30 ENCOUNTER — Encounter: Payer: Self-pay | Admitting: *Deleted

## 2011-09-30 ENCOUNTER — Encounter: Payer: Medicare Other | Admitting: Internal Medicine

## 2011-09-30 DIAGNOSIS — Z7901 Long term (current) use of anticoagulants: Secondary | ICD-10-CM

## 2011-09-30 DIAGNOSIS — I81 Portal vein thrombosis: Secondary | ICD-10-CM

## 2011-09-30 NOTE — Progress Notes (Unsigned)
Pt walked into clinic with c/o decreased appetite, nausea and no energy. Onset over 2 weeks.  After sitting left leg has no feeling, gets better with ambulation. Onset 2 weeks ago. Appointment given for this PM  Pt here with appointment with Dr Alexandria Lodge.

## 2011-09-30 NOTE — Progress Notes (Signed)
Agree with appt Thanks 

## 2011-09-30 NOTE — Progress Notes (Signed)
Anti-Coagulation Progress Note  Arthor Gorter. is a 66 y.o. male who is currently on an anti-coagulation regimen.    RECENT RESULTS: Recent results are below, the most recent result is correlated with a dose of 85 mg. per week: Lab Results  Component Value Date   INR 2.90 09/30/2011   INR 5.30 09/16/2011   INR 3.0 08/26/2011    ANTI-COAG DOSE:   Latest dosing instructions   Total Sun Mon Tue Wed Thu Fri Sat   80 12.5 mg 10 mg 12.5 mg 10 mg 12.5 mg 10 mg 12.5 mg    (5 mg2.5) (5 mg2) (5 mg2.5) (5 mg2) (5 mg2.5) (5 mg2) (5 mg2.5)         ANTICOAG SUMMARY: Anticoagulation Episode Summary              Current INR goal  Next INR check 10/21/2011   INR from last check 2.90 (09/30/2011)     Weekly max dose (mg)  Target end date    Indications Long-term (current) use of anticoagulants, Atrila fibrillation (Resolved), Thrombosis, portal vein   INR check location  Preferred lab    Send INR reminders to ANTICOAG IMP   Comments        Provider Role Specialty Phone number   [name], MD  Internal Medicine [phone]        ANTICOAG TODAY: Anticoagulation Summary as of 09/30/2011              INR goal      Selected INR 2.90 (09/30/2011) Next INR check 10/21/2011   Weekly max dose (mg)  Target end date    Indications Long-term (current) use of anticoagulants, Atrila fibrillation (Resolved), Thrombosis, portal vein    Anticoagulation Episode Summary              INR check location  Preferred lab    Send INR reminders to ANTICOAG IMP   Comments        Provider Role Specialty Phone number   [name], MD  Internal Medicine [phone]        PATIENT INSTRUCTIONS: Patient Instructions  Patient instructed to take medications as defined in the Anti-coagulation Track section of this encounter.  Patient instructed to take today's dose.  Patient verbalized understanding of these instructions.        FOLLOW-UP Return in 3 weeks (on 10/21/2011) for Follow  up INR.  [name], III Pharm.D., CACP

## 2011-09-30 NOTE — Patient Instructions (Signed)
Patient instructed to take medications as defined in the Anti-coagulation Track section of this encounter.  Patient instructed to take today's dose.  Patient verbalized understanding of these instructions.    

## 2011-10-01 ENCOUNTER — Ambulatory Visit (INDEPENDENT_AMBULATORY_CARE_PROVIDER_SITE_OTHER): Payer: Medicare Other | Admitting: Internal Medicine

## 2011-10-01 ENCOUNTER — Encounter: Payer: Self-pay | Admitting: Internal Medicine

## 2011-10-01 VITALS — BP 123/78 | HR 90 | Temp 97.0°F | Ht 74.0 in | Wt 155.5 lb

## 2011-10-01 DIAGNOSIS — F329 Major depressive disorder, single episode, unspecified: Secondary | ICD-10-CM

## 2011-10-01 DIAGNOSIS — F3289 Other specified depressive episodes: Secondary | ICD-10-CM

## 2011-10-01 MED ORDER — CITALOPRAM HYDROBROMIDE 20 MG PO TABS: 20.0000 mg | ORAL_TABLET | Freq: Every day | ORAL | Status: DC

## 2011-10-01 MED ORDER — MULTIVITAMINS PO TABS: 1.0000 | ORAL_TABLET | Freq: Every day | ORAL | Status: AC

## 2011-10-01 NOTE — Progress Notes (Signed)
Patient ID: [name]., male   DOB: [birth year], 66 y.o.   MRN: [id number] HPI:    1.  Patient is here "because of his poor appetite" and lack of energy. Patient reports being "a bit depressed" but denies SI/HI or mania. Reports good sleep and remains active throughout the day. Denies any day-time somnolence, snoring. Reports history of depression in the past with a successful treatment with Celexa and Prozac.  Review of Systems: Negative except per history of present illness  Physical Exam:  Nursing notes and vitals reviewed General:  alert, well-developed, and cooperative to examination.   Lungs:  normal respiratory effort, no accessory muscle use, normal breath sounds, no crackles, and no wheezes. Heart:  normal rate, regular rhythm, no murmurs, no gallop, and no rub.   Abdomen:  soft, non-tender, normal bowel sounds, no distention, no guarding, no rebound tenderness, no hepatomegaly, and no splenomegaly.   Extremities:  No cyanosis, clubbing, edema Neurologic:  alert & oriented X3, nonfocal exam Psych: flat affect.  Meds: Current Outpatient Prescriptions on File Prior to Visit  Medication Sig Dispense Refill  . albuterol (PROVENTIL HFA) 108 (90 BASE) MCG/ACT inhaler Inhale 2 puffs into the lungs every 6 (six) hours as needed for wheezing or shortness of breath.  1 Inhaler  6  . citalopram (CELEXA) 20 MG tablet Take 1 tablet (20 mg total) by mouth daily.  30 tablet  2  . diltiazem (CARDIZEM CD) 240 MG 24 hr capsule TAKE ONE CAPSULE BY MOUTH TWICE A DAY  60 capsule  6  . dronedarone (MULTAQ) 400 MG tablet Take 1 tablet (400 mg total) by mouth 2 (two) times daily with a meal.  60 tablet  6  . mirtazapine (REMERON) 15 MG tablet Take 1 tablet (15 mg total) by mouth at bedtime.  30 tablet  3  . omeprazole (PRILOSEC) 20 MG capsule Take 2 capsules (40 mg total) by mouth daily.  60 capsule  6  . oxyCODONE-acetaminophen (PERCOCET) 10-650 MG per tablet Take 1 tablet by mouth every 6 (six) hours  as needed for pain.  120 tablet  0  . tiotropium (SPIRIVA HANDIHALER) 18 MCG inhalation capsule Place 1 capsule (18 mcg total) into inhaler and inhale daily.  30 capsule  6  . warfarin (COUMADIN) 5 MG tablet Take as directed by your physician  80 tablet  1    Allergies: Penicillins Past Medical History  Diagnosis Date  . Atrial fibrillation with rapid ventricular response   . Hypertension   . Renal insufficiency     Renal U/S 12/04/2009 showed no pathological findings. Labs 12/04/2009 include normal ESR, C3, C4; neg ANA; SPEP showed nonspecific increase in the alpha-2 region with no M-spike; UPEP showed no monoclonal free light chains; urine IFE showed polyclonal increase in feree Kappa and/or free Lambda light chains.  . Chronic systolic heart failure   . Osteoarthritis   . Spondylosis   . Chronic pain   . Depression   . GERD (gastroesophageal reflux disease)   . Portal vein thrombosis   . Avascular necrosis     right hip s/p replacement  . 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 Gastritis   . Alcohol abuse   . Tobacco abuse   . Erectile dysfunction   . Bipolar disorder   . Hydrocele, unspecified   . Intermittent claudication   . Edema   . Lung nodule     Chest CT scan on 12/14/2008 showed a  nodular opacity at the left lung base felt to most likely represent scarring.  Follow-up chest CT scan on 06/02/2010 showed parenchymal scarring in the left apex, left  lower lobe, and lingula; some of this scarring at the left base had a nodular appearance, unchanged.  . 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  Thyroid scan 08/28/2009 was normal with no focal areas of abnormal increased or decreased activity seen; the uptake of I 131 sodium iodide at 24 hours was 5.7%.  TSH and free T4 normalized by 08/16/2009.  [name] Pneumonia   . Intestinal obstruction   . Chronic systolic heart failure   . Other primary cardiomyopathies     2D  echo on 02/06/2010 showed normal LV size with mild global hypokinesis, estimated EF 45%; mild biatrial enlargement; mild pulmonary hypertension (PA systolic pressure 42 mmHg assuming RA pressure 10 mmHg); normal RV size with mild systolic dysfunction.  [name] COPD (chronic obstructive pulmonary disease)   . Atrial fibrillation     Paroxysmal, failed medical therapy with amiodarone but has done very well with multaq  . Clostridium difficile colitis   . E coli bacteremia   . Cluster headaches   . DVT (deep venous thrombosis)     He has hypercoagulability with multiple occluded venous vessels  . Pleural plaque     Chest CT on 06/02/2010 showed stable extensive calcified pleural plaques involving the left hemithorax, consistent with asbestos related pleural disease.  . Weight loss     Normal colonoscopy by Dr. [name] on 11/06/2010.   Past Surgical History  Procedure Date  . Left hip bipolar hemiarthroplasty 09/09/2002    By Dr. Kristeen Miss  . Right total hip arthroplasty 03/08/2008    By Dr. Charlesetta Shanks  . Pacemaker insertion October 2004    By Dr. Charolette Child, gen change by [facility]   Family History  Problem Relation Age of Onset  . Hypertension Mother   . Cancer Brother     Unsure of type.  . Asthma Mother   . Coronary artery disease Mother   . Colon cancer Neg Hx   . Lung cancer Neg Hx   . Prostate cancer Neg Hx    History   Social History  . Marital Status: Widowed    Spouse Name: N/A    Number of Children: 10  . Years of Education: N/A   Occupational History  . RETIRED    Social History Main Topics  . Smoking status: Current Everyday Smoker -- 1.0 packs/day for 40 years    Types: Cigarettes  . Smokeless tobacco: Never Used   Comment: encouraged to quit today, not ready to quit  . Alcohol Use: No     former heavy use-none since last year  . Drug Use: 1 per week    Special: Marijuana  . Sexually Active: Not on file   Other Topics Concern  . Not on file   Social  History Narrative   Works as a Media planner part time  A/P: 1. Anorexia with generalized fatigue; likley of a multifactorial etiology such as depression in a setting of COPD and ongoing smoking. -smoking cessation addressed! -start Celexa -start MVI -continue with ensure supplementation -f/u in 4 weeks with Dr. Meredith Pel.

## 2011-10-01 NOTE — Patient Instructions (Signed)
Please, fill in a prescription for Celexa and multivitamins. Try to stop smoking! Please, follow up in 4 weeks or sooner.

## 2011-10-02 ENCOUNTER — Telehealth: Payer: Self-pay | Admitting: *Deleted

## 2011-10-02 NOTE — Telephone Encounter (Signed)
Pt called and pharmacy informed pt not to take Celexa and Multaq together. Talked with Dr Rogelia Boga - not to take Celexa and to inform Dr Denton Meek. [facility] uses [facility]. Stanton Kidney Zyrell Carmean RN 10/02/11 3PM

## 2011-10-07 ENCOUNTER — Telehealth: Payer: Self-pay | Admitting: *Deleted

## 2011-10-07 MED ORDER — BUPROPION HCL 75 MG PO TABS: 75.0000 mg | ORAL_TABLET | Freq: Two times a day (BID) | ORAL | Status: DC

## 2011-10-07 NOTE — Telephone Encounter (Signed)
Because other SSRIs have risk of QTc prolongation in combination with Multaq, plan is to try bupropion 75 mg BID for depression.  I discussed this by phone with patient, who is in agreement.  I advised him to start with a once daily 75 mg dose for 3 days, and then to increase to 75 mg BID if he has no problems.  I advised him to let us know right away if he does have problems on the bupropion.  He has F/U scheduled on 6/3.

## 2011-10-07 NOTE — Telephone Encounter (Signed)
Pt called stating he was informed not to take Celexa and Multaq together.  He was waiting for a new Rx to be sent in for his depression. Pt last seen 5/14 be Dr Denton Meek and started on Celexa Please advise.

## 2011-10-15 ENCOUNTER — Other Ambulatory Visit: Payer: Self-pay | Admitting: *Deleted

## 2011-10-15 DIAGNOSIS — M549 Dorsalgia, unspecified: Secondary | ICD-10-CM

## 2011-10-15 DIAGNOSIS — M1990 Unspecified osteoarthritis, unspecified site: Secondary | ICD-10-CM

## 2011-10-15 DIAGNOSIS — G8929 Other chronic pain: Secondary | ICD-10-CM

## 2011-10-15 NOTE — Telephone Encounter (Signed)
Last refill 4/29 Call when ready. # [id number]

## 2011-10-17 MED ORDER — OXYCODONE-ACETAMINOPHEN 10-650 MG PO TABS: 1.0000 | ORAL_TABLET | Freq: Four times a day (QID) | ORAL | Status: DC | PRN

## 2011-10-17 NOTE — Telephone Encounter (Signed)
Refill printed and signed - nurse to complete. 

## 2011-10-17 NOTE — Telephone Encounter (Signed)
Pt aware written Rx is ready. 

## 2011-10-18 ENCOUNTER — Telehealth: Payer: Self-pay | Admitting: Pharmacist

## 2011-10-18 NOTE — Telephone Encounter (Signed)
Left message to come to appointment at 1000h on Monday October 21, 2011.

## 2011-10-21 ENCOUNTER — Ambulatory Visit (INDEPENDENT_AMBULATORY_CARE_PROVIDER_SITE_OTHER): Payer: Medicare Other | Admitting: Pharmacist

## 2011-10-21 DIAGNOSIS — I82409 Acute embolism and thrombosis of unspecified deep veins of unspecified lower extremity: Secondary | ICD-10-CM

## 2011-10-21 DIAGNOSIS — I81 Portal vein thrombosis: Secondary | ICD-10-CM

## 2011-10-21 DIAGNOSIS — Z7901 Long term (current) use of anticoagulants: Secondary | ICD-10-CM

## 2011-10-21 MED ORDER — WARFARIN SODIUM 5 MG PO TABS: ORAL_TABLET | ORAL | Status: DC

## 2011-10-21 NOTE — Patient Instructions (Signed)
Patient instructed to take medications as defined in the Anti-coagulation Track section of this encounter.  Patient instructed to take today's dose.  Patient verbalized understanding of these instructions.    

## 2011-10-21 NOTE — Progress Notes (Signed)
Anti-Coagulation Progress Note  Travis Longest. is a 66 y.o. male who is currently on an anti-coagulation regimen.    RECENT RESULTS: Recent results are below, the most recent result is correlated with a dose of 80 mg. per week: Lab Results  Component Value Date   INR 1.30 10/21/2011   INR 2.90 09/30/2011   INR 5.30 09/16/2011    ANTI-COAG DOSE:   Latest dosing instructions   Total Sun Mon Tue Wed Thu Fri Sat   87.5 12.5 mg 12.5 mg 12.5 mg 12.5 mg 12.5 mg 12.5 mg 12.5 mg    (5 mg2.5) (5 mg2.5) (5 mg2.5) (5 mg2.5) (5 mg2.5) (5 mg2.5) (5 mg2.5)         ANTICOAG SUMMARY: Anticoagulation Episode Summary              Current INR goal  Next INR check 11/04/2011   INR from last check 1.30 (10/21/2011)     Weekly max dose (mg)  Target end date    Indications Long-term (current) use of anticoagulants, Atrila fibrillation (Resolved), Thrombosis, portal vein   INR check location  Preferred lab    Send INR reminders to ANTICOAG IMP   Comments        Provider Role Specialty Phone number   [name], MD  Internal Medicine [phone]        ANTICOAG TODAY: Anticoagulation Summary as of 10/21/2011              INR goal      Selected INR 1.30 (10/21/2011) Next INR check 11/04/2011   Weekly max dose (mg)  Target end date    Indications Long-term (current) use of anticoagulants, Atrila fibrillation (Resolved), Thrombosis, portal vein    Anticoagulation Episode Summary              INR check location  Preferred lab    Send INR reminders to ANTICOAG IMP   Comments        Provider Role Specialty Phone number   [name], MD  Internal Medicine [phone]        PATIENT INSTRUCTIONS: Patient Instructions  Patient instructed to take medications as defined in the Anti-coagulation Track section of this encounter.  Patient instructed to take today's dose.  Patient verbalized understanding of these instructions.        FOLLOW-UP Return in 2 weeks (on  11/04/2011) for Follow up INR at 1030h.  [name], III Pharm.D., CACP

## 2011-11-04 ENCOUNTER — Ambulatory Visit: Payer: Medicare Other

## 2011-11-05 ENCOUNTER — Other Ambulatory Visit: Payer: Self-pay | Admitting: *Deleted

## 2011-11-05 DIAGNOSIS — I82409 Acute embolism and thrombosis of unspecified deep veins of unspecified lower extremity: Secondary | ICD-10-CM

## 2011-11-05 DIAGNOSIS — J449 Chronic obstructive pulmonary disease, unspecified: Secondary | ICD-10-CM

## 2011-11-05 MED ORDER — DILTIAZEM HCL ER COATED BEADS 240 MG PO CP24: 240.0000 mg | ORAL_CAPSULE | Freq: Two times a day (BID) | ORAL | Status: DC

## 2011-11-05 MED ORDER — OMEPRAZOLE 20 MG PO CPDR: 40.0000 mg | DELAYED_RELEASE_CAPSULE | Freq: Every day | ORAL | Status: DC

## 2011-11-05 MED ORDER — TIOTROPIUM BROMIDE MONOHYDRATE 18 MCG IN CAPS: 18.0000 ug | ORAL_CAPSULE | Freq: Every day | RESPIRATORY_TRACT | Status: DC

## 2011-11-05 MED ORDER — DRONEDARONE HCL 400 MG PO TABS: 400.0000 mg | ORAL_TABLET | Freq: Two times a day (BID) | ORAL | Status: DC

## 2011-11-05 NOTE — Telephone Encounter (Signed)
It is OK to D/C the order.

## 2011-11-05 NOTE — Telephone Encounter (Signed)
The warfarin should not be refilled with a 90 day supply because of the need for monitoring; a prescription was sent on June 3 for the warfarin, with one additional refill, so this should be up-to-date.  I provided refills for a 90 day supply of the other 3 requested medications.

## 2011-11-05 NOTE — Telephone Encounter (Signed)
Pt called and states he is not using the home oxygen and ask that you d/c the order.   He gets it from [facility].   Last used 6 months ago.  He is charged every month for this.  Pt # [id number]

## 2011-11-05 NOTE — Telephone Encounter (Signed)
[facility] days supply.

## 2011-11-06 NOTE — Telephone Encounter (Signed)
Called [facility] and talked with Natally.  VO given to d/c home O2. They will pick up equipment

## 2011-11-06 NOTE — Telephone Encounter (Signed)
Talked with pharmacy and informed them why Dr Meredith Pel reason for not renewing Coumadin for 90 days - due to monitoring Coumadin levels.

## 2011-11-13 ENCOUNTER — Other Ambulatory Visit: Payer: Self-pay | Admitting: *Deleted

## 2011-11-13 DIAGNOSIS — G8929 Other chronic pain: Secondary | ICD-10-CM

## 2011-11-13 DIAGNOSIS — M549 Dorsalgia, unspecified: Secondary | ICD-10-CM

## 2011-11-13 DIAGNOSIS — M1990 Unspecified osteoarthritis, unspecified site: Secondary | ICD-10-CM

## 2011-11-13 NOTE — Telephone Encounter (Signed)
To be up written Rx.

## 2011-11-15 ENCOUNTER — Other Ambulatory Visit: Payer: Self-pay | Admitting: Internal Medicine

## 2011-11-15 DIAGNOSIS — G8929 Other chronic pain: Secondary | ICD-10-CM

## 2011-11-15 DIAGNOSIS — M1990 Unspecified osteoarthritis, unspecified site: Secondary | ICD-10-CM

## 2011-11-15 DIAGNOSIS — M549 Dorsalgia, unspecified: Secondary | ICD-10-CM

## 2011-11-15 MED ORDER — OXYCODONE-ACETAMINOPHEN 10-650 MG PO TABS: 1.0000 | ORAL_TABLET | Freq: Four times a day (QID) | ORAL | Status: DC | PRN

## 2011-11-15 NOTE — Progress Notes (Signed)
No red flags in chart. Appears to be chronic med. So printed off scripts for next three months.

## 2011-11-18 ENCOUNTER — Other Ambulatory Visit: Payer: Self-pay | Admitting: Internal Medicine

## 2011-11-20 ENCOUNTER — Encounter: Payer: Self-pay | Admitting: Internal Medicine

## 2011-11-20 ENCOUNTER — Ambulatory Visit (INDEPENDENT_AMBULATORY_CARE_PROVIDER_SITE_OTHER): Payer: Medicare Other | Admitting: Internal Medicine

## 2011-11-20 VITALS — BP 121/69 | HR 66 | Temp 98.3°F | Ht 74.0 in | Wt 161.1 lb

## 2011-11-20 DIAGNOSIS — J449 Chronic obstructive pulmonary disease, unspecified: Secondary | ICD-10-CM

## 2011-11-20 DIAGNOSIS — M25551 Pain in right hip: Secondary | ICD-10-CM

## 2011-11-20 DIAGNOSIS — M25559 Pain in unspecified hip: Secondary | ICD-10-CM

## 2011-11-20 DIAGNOSIS — I10 Essential (primary) hypertension: Secondary | ICD-10-CM

## 2011-11-20 NOTE — Progress Notes (Signed)
  Subjective:    Patient ID: [name]., male    DOB: 13-Jun-1945, 66 y.o.   MRN: [id number]  HPI Patient returns for followup of his right hip pain, COPD, hypertension, and other medical problems.  His main complaint today is worsening right hip pain aggravated by ambulation and weightbearing; this is significantly limiting his ability to get around.  The pain does not radiate into his thigh or leg.  He apparently canceled a prior orthopedic surgery appointment in April; he reports that there was a problem with a prior bill which he could not pay.  He has stable exertional dyspnea, and reports that he is using his inhalers.  He missed a few doses of warfarin over the past week.  He did not bring his medications to clinic; he confirmed the list from memory.  His appetite is still not good.   Review of Systems  Constitutional: Negative for fever and chills.  Respiratory: Positive for shortness of breath (Chronic stable exertional dyspnea).   Cardiovascular: Negative for leg swelling.  Musculoskeletal: Positive for arthralgias (Right hip pain). Negative for back pain.       Objective:   Physical Exam  Constitutional: No distress.  Cardiovascular: Normal rate, regular rhythm and normal heart sounds.  Exam reveals no gallop and no friction rub.   No murmur heard. Pulmonary/Chest: Effort normal and breath sounds normal. No respiratory distress. He has no wheezes. He has no rales.  Abdominal: Soft. Bowel sounds are normal. He exhibits no distension. There is no tenderness. There is no rebound and no guarding.  Musculoskeletal: He exhibits no edema.          Assessment & Plan:

## 2011-11-20 NOTE — Assessment & Plan Note (Signed)
Lab Results  Component Value Date   NA 145 08/14/2011   K 4.1 08/14/2011   CL 114* 08/14/2011   CO2 24 08/14/2011   BUN 24* 08/14/2011   CREATININE 1.38* 08/14/2011    BP Readings from Last 3 Encounters:  11/20/11 121/69  10/01/11 123/78  09/16/11 115/68    Assessment: Hypertension control:  controlled  Progress toward goals:  at goal Barriers to meeting goals:  no barriers identified  Plan: Hypertension treatment:  continue current medications

## 2011-11-20 NOTE — Patient Instructions (Addendum)
Please follow up with orthopedic surgery for right hip pain. Please make an appointment to see Dr. Alexandria Lodge in the anticoagulation clinic next week.

## 2011-11-20 NOTE — Assessment & Plan Note (Signed)
Assessment: Patient has chronic exertional dyspnea, and he is doing better since he resumed his Spiriva following his last visit.  His lung exam is clear today.    Plan: Continue current medications.

## 2011-11-20 NOTE — Assessment & Plan Note (Signed)
Assessment: Patient has worsening right hip pain.  Right hip x-rays in March showed right total hip arthroplasty, without evidence of hardware complication; associated dystrophic calcifications; no fracture or dislocation.  Plan: As before, I advised that patient see an orthopedic surgeon.  We will make referral and hopefully patient can work out a satisfactory payment arrangement.  If we are unable to arrange orthopedic surgery evaluation locally, then our only option will be [facility].  Will continue current dose of oxycodone/acetaminophen for pain.
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
## 2011-12-12 ENCOUNTER — Other Ambulatory Visit: Payer: Self-pay | Admitting: *Deleted

## 2011-12-12 DIAGNOSIS — M1990 Unspecified osteoarthritis, unspecified site: Secondary | ICD-10-CM

## 2011-12-12 DIAGNOSIS — G8929 Other chronic pain: Secondary | ICD-10-CM

## 2011-12-12 DIAGNOSIS — M549 Dorsalgia, unspecified: Secondary | ICD-10-CM

## 2011-12-12 NOTE — Telephone Encounter (Signed)
Per Dr. Donnelly Stager note, she completed prescriptions to cover 3 refills in June.

## 2011-12-13 NOTE — Telephone Encounter (Signed)
Talked with Dr Meredith Pel - there are 2 Rx for Precocet - pt is to come by clinic 12/13/11 for Rx and there will be 1 Percocet Rx left in med folder. Dr Meredith Pel aware.Stanton Kidney Ashari Llewellyn RN 12/13/11 3:20PM

## 2011-12-25 ENCOUNTER — Other Ambulatory Visit: Payer: Self-pay | Admitting: Internal Medicine

## 2011-12-25 NOTE — Telephone Encounter (Signed)
Please schedule a follow-up appointment in the anticoagulation clinic within the next 2 weeks and notify patient.

## 2011-12-25 NOTE — Telephone Encounter (Signed)
Message sent to front desk to schedule pt an appt w/Jay Groce.

## 2012-01-06 ENCOUNTER — Ambulatory Visit: Payer: Medicare Other

## 2012-01-10 ENCOUNTER — Other Ambulatory Visit: Payer: Self-pay | Admitting: *Deleted

## 2012-01-10 DIAGNOSIS — M549 Dorsalgia, unspecified: Secondary | ICD-10-CM

## 2012-01-10 DIAGNOSIS — M1990 Unspecified osteoarthritis, unspecified site: Secondary | ICD-10-CM

## 2012-01-10 DIAGNOSIS — G8929 Other chronic pain: Secondary | ICD-10-CM

## 2012-01-13 NOTE — Telephone Encounter (Addendum)
This refill was already written by Dr. Rogelia Boga in June and should be on file in the clinic.

## 2012-01-14 NOTE — Telephone Encounter (Signed)
Done

## 2012-02-07 ENCOUNTER — Other Ambulatory Visit: Payer: Self-pay | Admitting: *Deleted

## 2012-02-07 DIAGNOSIS — M1990 Unspecified osteoarthritis, unspecified site: Secondary | ICD-10-CM

## 2012-02-07 DIAGNOSIS — G8929 Other chronic pain: Secondary | ICD-10-CM

## 2012-02-07 DIAGNOSIS — M549 Dorsalgia, unspecified: Secondary | ICD-10-CM

## 2012-02-10 MED ORDER — OXYCODONE-ACETAMINOPHEN 10-650 MG PO TABS: 1.0000 | ORAL_TABLET | Freq: Four times a day (QID) | ORAL | Status: DC | PRN

## 2012-03-02 ENCOUNTER — Ambulatory Visit: Payer: Medicare Other | Admitting: Internal Medicine

## 2012-03-06 ENCOUNTER — Other Ambulatory Visit: Payer: Self-pay | Admitting: *Deleted

## 2012-03-06 DIAGNOSIS — G8929 Other chronic pain: Secondary | ICD-10-CM

## 2012-03-06 DIAGNOSIS — M1990 Unspecified osteoarthritis, unspecified site: Secondary | ICD-10-CM

## 2012-03-06 DIAGNOSIS — M549 Dorsalgia, unspecified: Secondary | ICD-10-CM

## 2012-03-10 ENCOUNTER — Other Ambulatory Visit: Payer: Self-pay | Admitting: Internal Medicine

## 2012-03-10 MED ORDER — OXYCODONE-ACETAMINOPHEN 10-650 MG PO TABS: 1.0000 | ORAL_TABLET | Freq: Four times a day (QID) | ORAL | Status: DC | PRN

## 2012-03-10 MED ORDER — OXYCODONE-ACETAMINOPHEN 10-325 MG PO TABS: 1.0000 | ORAL_TABLET | Freq: Four times a day (QID) | ORAL | Status: DC | PRN

## 2012-03-10 NOTE — Progress Notes (Signed)
I changed Rx to lower dose APAP as FDA limiting dose Jan 2014

## 2012-03-17 ENCOUNTER — Encounter (HOSPITAL_COMMUNITY): Payer: Self-pay | Admitting: *Deleted

## 2012-03-17 ENCOUNTER — Inpatient Hospital Stay (HOSPITAL_COMMUNITY)
Admission: EM | Admit: 2012-03-17 | Discharge: 2012-03-19 | DRG: 190 | Disposition: A | Discharge status: Home / Self Care | Payer: Medicare Other | Attending: Infectious Diseases | Admitting: Infectious Diseases

## 2012-03-17 ENCOUNTER — Emergency Department (HOSPITAL_COMMUNITY): Payer: Medicare Other

## 2012-03-17 DIAGNOSIS — E860 Dehydration: Secondary | ICD-10-CM

## 2012-03-17 DIAGNOSIS — Z23 Encounter for immunization: Secondary | ICD-10-CM

## 2012-03-17 DIAGNOSIS — Z96649 Presence of unspecified artificial hip joint: Secondary | ICD-10-CM

## 2012-03-17 DIAGNOSIS — R079 Chest pain, unspecified: Secondary | ICD-10-CM | POA: Diagnosis present

## 2012-03-17 DIAGNOSIS — J441 Chronic obstructive pulmonary disease with (acute) exacerbation: Principal | ICD-10-CM | POA: Diagnosis present

## 2012-03-17 DIAGNOSIS — R634 Abnormal weight loss: Secondary | ICD-10-CM | POA: Diagnosis present

## 2012-03-17 DIAGNOSIS — I739 Peripheral vascular disease, unspecified: Secondary | ICD-10-CM | POA: Diagnosis present

## 2012-03-17 DIAGNOSIS — I10 Essential (primary) hypertension: Secondary | ICD-10-CM | POA: Diagnosis present

## 2012-03-17 DIAGNOSIS — I129 Hypertensive chronic kidney disease with stage 1 through stage 4 chronic kidney disease, or unspecified chronic kidney disease: Secondary | ICD-10-CM | POA: Diagnosis present

## 2012-03-17 DIAGNOSIS — I5022 Chronic systolic (congestive) heart failure: Secondary | ICD-10-CM | POA: Diagnosis present

## 2012-03-17 DIAGNOSIS — R636 Underweight: Secondary | ICD-10-CM

## 2012-03-17 DIAGNOSIS — E43 Unspecified severe protein-calorie malnutrition: Secondary | ICD-10-CM | POA: Diagnosis present

## 2012-03-17 DIAGNOSIS — N179 Acute kidney failure, unspecified: Secondary | ICD-10-CM | POA: Diagnosis present

## 2012-03-17 DIAGNOSIS — K219 Gastro-esophageal reflux disease without esophagitis: Secondary | ICD-10-CM | POA: Diagnosis present

## 2012-03-17 DIAGNOSIS — Z950 Presence of cardiac pacemaker: Secondary | ICD-10-CM

## 2012-03-17 DIAGNOSIS — I5042 Chronic combined systolic (congestive) and diastolic (congestive) heart failure: Secondary | ICD-10-CM | POA: Diagnosis present

## 2012-03-17 DIAGNOSIS — F319 Bipolar disorder, unspecified: Secondary | ICD-10-CM | POA: Diagnosis present

## 2012-03-17 DIAGNOSIS — F1010 Alcohol abuse, uncomplicated: Secondary | ICD-10-CM | POA: Diagnosis present

## 2012-03-17 DIAGNOSIS — N189 Chronic kidney disease, unspecified: Secondary | ICD-10-CM | POA: Diagnosis present

## 2012-03-17 DIAGNOSIS — I509 Heart failure, unspecified: Secondary | ICD-10-CM | POA: Diagnosis present

## 2012-03-17 DIAGNOSIS — F17200 Nicotine dependence, unspecified, uncomplicated: Secondary | ICD-10-CM | POA: Diagnosis present

## 2012-03-17 DIAGNOSIS — E875 Hyperkalemia: Secondary | ICD-10-CM

## 2012-03-17 LAB — COMPREHENSIVE METABOLIC PANEL
ALT: 11 U/L (ref 0–53)
AST: 17 U/L (ref 0–37)
Albumin: 4.2 g/dL (ref 3.5–5.2)
Alkaline Phosphatase: 97 U/L (ref 39–117)
CO2: 23 mEq/L (ref 19–32)
Chloride: 101 mEq/L (ref 96–112)
GFR calc non Af Amer: 30 mL/min — ABNORMAL LOW (ref >90)
Potassium: 5.5 mEq/L — ABNORMAL HIGH (ref 3.5–5.1)
Sodium: 139 mEq/L (ref 135–145)
Total Bilirubin: 0.5 mg/dL (ref 0.3–1.2)

## 2012-03-17 LAB — CBC WITH DIFFERENTIAL/PLATELET
Basophils Absolute: 0 10*3/uL (ref 0.0–0.1)
Basophils Relative: 0 % (ref 0–1)
HCT: 52.4 % — ABNORMAL HIGH (ref 39.0–52.0)
Lymphocytes Relative: 20 % (ref 12–46)
MCHC: 34.9 g/dL (ref 30.0–36.0)
Monocytes Absolute: 0.8 10*3/uL (ref 0.1–1.0)
Neutro Abs: 8 10*3/uL — ABNORMAL HIGH (ref 1.7–7.7)
Neutrophils Relative %: 72 % (ref 43–77)
RDW: 13.8 % (ref 11.5–15.5)
WBC: 11.1 10*3/uL — ABNORMAL HIGH (ref 4.0–10.5)

## 2012-03-17 LAB — POCT I-STAT TROPONIN I: Troponin i, poc: 0.02 ng/mL (ref 0.00–0.08)

## 2012-03-17 MED ORDER — ONDANSETRON HCL 4 MG/2ML IJ SOLN: 4.0000 mg | Freq: Once | INTRAMUSCULAR | Status: DC

## 2012-03-17 MED ORDER — ONDANSETRON HCL 4 MG/2ML IJ SOLN
INTRAMUSCULAR | Status: AC
  Administered 2012-03-17: 4 mg via INTRAVENOUS
  Filled 2012-03-17: qty 2

## 2012-03-17 MED ORDER — DILTIAZEM HCL ER COATED BEADS 240 MG PO CP24
240.0000 mg | ORAL_CAPSULE | Freq: Every morning | ORAL | Status: DC
  Administered 2012-03-18 – 2012-03-19 (×2): 240 mg via ORAL
  Filled 2012-03-17 (×2): qty 1

## 2012-03-17 MED ORDER — OXYCODONE HCL 5 MG PO TABS
5.0000 mg | ORAL_TABLET | ORAL | Status: DC | PRN
  Administered 2012-03-18 – 2012-03-19 (×3): 5 mg via ORAL
  Filled 2012-03-17 (×3): qty 1

## 2012-03-17 MED ORDER — BUPROPION HCL 75 MG PO TABS
75.0000 mg | ORAL_TABLET | Freq: Every morning | ORAL | Status: DC
  Administered 2012-03-18 – 2012-03-19 (×2): 75 mg via ORAL
  Filled 2012-03-17 (×2): qty 1

## 2012-03-17 MED ORDER — IPRATROPIUM BROMIDE 0.02 % IN SOLN: 0.5000 mg | RESPIRATORY_TRACT | Status: DC | PRN

## 2012-03-17 MED ORDER — DRONEDARONE HCL 400 MG PO TABS
400.0000 mg | ORAL_TABLET | Freq: Every morning | ORAL | Status: DC
  Administered 2012-03-18 – 2012-03-19 (×2): 400 mg via ORAL
  Filled 2012-03-17 (×2): qty 1

## 2012-03-17 MED ORDER — ONDANSETRON HCL 4 MG PO TABS: 4.0000 mg | ORAL_TABLET | Freq: Four times a day (QID) | ORAL | Status: DC | PRN

## 2012-03-17 MED ORDER — INFLUENZA VIRUS VACC SPLIT PF IM SUSP
0.5000 mL | INTRAMUSCULAR | Status: AC
  Administered 2012-03-18: 0.5 mL via INTRAMUSCULAR
  Filled 2012-03-17: qty 0.5

## 2012-03-17 MED ORDER — NITROGLYCERIN 0.4 MG SL SUBL: 0.4000 mg | SUBLINGUAL_TABLET | SUBLINGUAL | Status: DC | PRN

## 2012-03-17 MED ORDER — POLYETHYLENE GLYCOL 3350 17 G PO PACK
17.0000 g | PACK | Freq: Every day | ORAL | Status: DC | PRN
  Administered 2012-03-18: 17 g via ORAL
  Filled 2012-03-17: qty 1

## 2012-03-17 MED ORDER — SODIUM CHLORIDE 0.9 % IJ SOLN
3.0000 mL | Freq: Two times a day (BID) | INTRAMUSCULAR | Status: DC
  Administered 2012-03-18 – 2012-03-19 (×3): 3 mL via INTRAVENOUS

## 2012-03-17 MED ORDER — ONDANSETRON HCL 4 MG/2ML IJ SOLN
4.0000 mg | Freq: Four times a day (QID) | INTRAMUSCULAR | Status: DC | PRN
  Administered 2012-03-18: 4 mg via INTRAVENOUS
  Filled 2012-03-17: qty 2

## 2012-03-17 MED ORDER — PREDNISONE 50 MG PO TABS
60.0000 mg | ORAL_TABLET | Freq: Every day | ORAL | Status: DC
  Administered 2012-03-18 – 2012-03-19 (×2): 60 mg via ORAL
  Filled 2012-03-17 (×3): qty 1

## 2012-03-17 MED ORDER — IPRATROPIUM BROMIDE 0.02 % IN SOLN
0.5000 mg | Freq: Four times a day (QID) | RESPIRATORY_TRACT | Status: DC
  Administered 2012-03-18 – 2012-03-19 (×6): 0.5 mg via RESPIRATORY_TRACT
  Filled 2012-03-17 (×6): qty 2.5

## 2012-03-17 MED ORDER — MOXIFLOXACIN HCL IN NACL 400 MG/250ML IV SOLN
400.0000 mg | INTRAVENOUS | Status: DC
  Administered 2012-03-18: 400 mg via INTRAVENOUS
  Filled 2012-03-17 (×2): qty 250

## 2012-03-17 MED ORDER — ALBUTEROL SULFATE (5 MG/ML) 0.5% IN NEBU: 2.5000 mg | INHALATION_SOLUTION | RESPIRATORY_TRACT | Status: DC | PRN

## 2012-03-17 MED ORDER — ALBUTEROL SULFATE (5 MG/ML) 0.5% IN NEBU
2.5000 mg | INHALATION_SOLUTION | Freq: Four times a day (QID) | RESPIRATORY_TRACT | Status: DC
  Administered 2012-03-18 – 2012-03-19 (×6): 2.5 mg via RESPIRATORY_TRACT
  Filled 2012-03-17 (×6): qty 0.5

## 2012-03-17 MED ORDER — HEPARIN SODIUM (PORCINE) 5000 UNIT/ML IJ SOLN
5000.0000 [IU] | Freq: Three times a day (TID) | INTRAMUSCULAR | Status: DC
  Administered 2012-03-18: 5000 [IU] via SUBCUTANEOUS
  Filled 2012-03-17 (×4): qty 1

## 2012-03-17 MED ORDER — SODIUM POLYSTYRENE SULFONATE 15 GM/60ML PO SUSP
30.0000 g | Freq: Once | ORAL | Status: AC
  Administered 2012-03-17: 30 g via ORAL
  Filled 2012-03-17: qty 120

## 2012-03-17 MED ORDER — ONDANSETRON HCL 4 MG/2ML IJ SOLN
4.0000 mg | Freq: Once | INTRAMUSCULAR | Status: AC
  Administered 2012-03-17: 4 mg via INTRAVENOUS

## 2012-03-17 MED ORDER — PANTOPRAZOLE SODIUM 40 MG PO TBEC
40.0000 mg | DELAYED_RELEASE_TABLET | Freq: Every day | ORAL | Status: DC
  Administered 2012-03-18 – 2012-03-19 (×2): 40 mg via ORAL
  Filled 2012-03-17 (×2): qty 1

## 2012-03-17 MED ORDER — SODIUM CHLORIDE 0.9 % IV SOLN
INTRAVENOUS | Status: AC
  Administered 2012-03-18: 150 mL/h via INTRAVENOUS
  Administered 2012-03-18: 01:00:00 via INTRAVENOUS

## 2012-03-17 NOTE — ED Provider Notes (Signed)
Medical screening examination/treatment/procedure(s) were performed by non-physician practitioner and as supervising physician I was immediately available for consultation/collaboration.    Charmon Thorson L Pearlena Ow, MD 03/17/12 2312 
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
## 2012-03-17 NOTE — ED Notes (Signed)
Advised admitting MD of the patient's latest BP.

## 2012-03-17 NOTE — ED Notes (Signed)
Got off work Sunday morning and got weak, no appetite.  Pt feels like body shut down.  Pt had some chest pain and does have pacer.  Pt is diaphoretic.  No nausea.  Pt reports sob.

## 2012-03-17 NOTE — ED Notes (Signed)
MD at bedside. 

## 2012-03-17 NOTE — ED Notes (Signed)
Pt reports lack of appetite x  1 wk stating "I've lost at least 7 lbs in the last month." Reports weakness, no energy, dizziness, and "head spinning." Mild blurred vision.

## 2012-03-17 NOTE — ED Provider Notes (Signed)
History     CSN: [id number]  Arrival date & time 03/17/12  1636   First MD Initiated Contact with Patient 03/17/12 1719      Chief Complaint  Patient presents with  . Shortness of Breath  . Chest Pain    (Consider location/radiation/quality/duration/timing/severity/associated sxs/prior treatment) HPI  66 year old male with multiple comorbidities including age to a fibrillation, with pacemaker, renal insufficiency, and CHF presents complaint of chest pain and shortness of breath.  Pt reports for the past 1 week he has gradual onset of shortness of breath worsening with exertion.  Has occasional L sided chest pressure that is non radiating and achy in sensation.  Today the chest pressure worsening, lasting for a few minutes but causing him to be diaphroretic.  Currently denies chest pressure.  He also endorse cough productive with white sputum, having decreased in appetite, and having progressive weakness.  He does not use O2 at home.  Has hx of afib and was supposed to be on coumadin but refused to take it and has not taking it for a long time.  Denies fever, chills, hemoptysis, leg swelling.  Is a smoker.  Does have a cardiologist.    Past Medical History  Diagnosis Date  . Atrial fibrillation with rapid ventricular response   . Hypertension   . Renal insufficiency     Renal U/S 12/04/2009 showed no pathological findings. Labs 12/04/2009 include normal ESR, C3, C4; neg ANA; SPEP showed nonspecific increase in the alpha-2 region with no M-spike; UPEP showed no monoclonal free light chains; urine IFE showed polyclonal increase in feree Kappa and/or free Lambda light chains.  . Chronic systolic heart failure   . Osteoarthritis   . Spondylosis   . Chronic pain   . Depression   . GERD (gastroesophageal reflux disease)   . Portal vein thrombosis   . Avascular necrosis     right hip s/p replacement  . 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 Gastritis   . Alcohol  abuse   . Tobacco abuse   . Erectile dysfunction   . Bipolar disorder   . Hydrocele, unspecified   . Intermittent claudication   . Edema   . Lung nodule     Chest CT scan on 12/14/2008 showed a nodular opacity at the left lung base felt to most likely represent scarring.  Follow-up chest CT scan on 06/02/2010 showed parenchymal scarring in the left apex, left  lower lobe, and lingula; some of this scarring at the left base had a nodular appearance, unchanged.  . 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  Thyroid scan 08/28/2009 was normal with no focal areas of abnormal increased or decreased activity seen; the uptake of I 131 sodium iodide at 24 hours was 5.7%.  TSH and free T4 normalized by 08/16/2009.  [name] Pneumonia   . Intestinal obstruction   . Chronic systolic heart failure   . Other primary cardiomyopathies     2D echo on 02/06/2010 showed normal LV size with mild global hypokinesis, estimated EF 45%; mild biatrial enlargement; mild pulmonary hypertension (PA systolic pressure 42 mmHg assuming RA pressure 10 mmHg); normal RV size with mild systolic dysfunction.  [name] COPD (chronic obstructive pulmonary disease)   . Atrial fibrillation     Paroxysmal, failed medical therapy with amiodarone but has done very well with multaq  . Clostridium difficile colitis   . E coli bacteremia   . Cluster headaches   .  DVT (deep venous thrombosis)     He has hypercoagulability with multiple occluded venous vessels  . Pleural plaque     Chest CT on 06/02/2010 showed stable extensive calcified pleural plaques involving the left hemithorax, consistent with asbestos related pleural disease.  . Weight loss     Normal colonoscopy by Dr. [name] on 11/06/2010.    Past Surgical History  Procedure Date  . Left hip bipolar hemiarthroplasty 09/09/2002    By Dr. Kristeen Miss  . Right total hip arthroplasty 03/08/2008    By Dr. Charlesetta Shanks  . Pacemaker insertion October  2004    By Dr. Charolette Child, gen change by Fawn Kirk    Family History  Problem Relation Age of Onset  . Hypertension Mother   . Cancer Brother     Unsure of type.  . Asthma Mother   . Coronary artery disease Mother   . Colon cancer Neg Hx   . Lung cancer Neg Hx   . Prostate cancer Neg Hx     History  Substance Use Topics  . Smoking status: Current Every Day Smoker -- 1.5 packs/day for 40 years    Types: Cigarettes  . Smokeless tobacco: Never Used   Comment: encouraged to quit today, not ready to quit  . Alcohol Use: No      Review of Systems  All other systems reviewed and are negative.    Allergies  Penicillins  Home Medications   Current Outpatient Rx  Name Route Sig Dispense Refill  . ALBUTEROL SULFATE HFA 108 (90 BASE) MCG/ACT IN AERS Inhalation Inhale 2 puffs into the lungs every 6 (six) hours as needed for wheezing or shortness of breath. 1 Inhaler 6  . BUPROPION HCL 75 MG PO TABS  TAKE 1 TABLET (75 MG TOTAL) BY MOUTH 2 (TWO) TIMES DAILY. 60 tablet 3  . DILTIAZEM HCL ER COATED BEADS 240 MG PO CP24 Oral Take 1 capsule (240 mg total) by mouth 2 (two) times daily. 180 capsule 1  . DRONEDARONE HCL 400 MG PO TABS Oral Take 1 tablet (400 mg total) by mouth 2 (two) times daily with a meal. 180 tablet 3  . MIRTAZAPINE 15 MG PO TABS Oral Take 1 tablet (15 mg total) by mouth at bedtime. 30 tablet 3  . MULTIVITAMINS PO TABS Oral Take 1 tablet by mouth daily. 120 tablet 2  . OMEPRAZOLE 20 MG PO CPDR Oral Take 2 capsules (40 mg total) by mouth daily. 180 capsule 1  . OXYCODONE-ACETAMINOPHEN 10-325 MG PO TABS Oral Take 1 tablet by mouth every 6 (six) hours as needed for pain. 120 tablet 0  . TIOTROPIUM BROMIDE MONOHYDRATE 18 MCG IN CAPS Inhalation Place 1 capsule (18 mcg total) into inhaler and inhale daily. 90 capsule 1  . WARFARIN SODIUM 5 MG PO TABS  TAKE AS DIRECTED BY YOUR PHYSICIAN 80 tablet 0    BP 133/98  Pulse 96  Temp 97.6 F (36.4 C) (Oral)  Resp 18  SpO2  99%  Physical Exam  Nursing note and vitals reviewed. Constitutional: He is oriented to person, place, and time. He appears well-developed and well-nourished. No distress.       Awake, alert, nontoxic appearance  HENT:  Head: Atraumatic.       Mouth is dry  Eyes: Conjunctivae normal are normal. Right eye exhibits no discharge. Left eye exhibits no discharge.  Neck: Normal range of motion. Neck supple.  Cardiovascular: Normal rate and regular rhythm.   Pulmonary/Chest: Effort normal. No  respiratory distress. He has no wheezes. He has no rales. He exhibits no tenderness.  Abdominal: Soft. There is no tenderness. There is no rebound.  Musculoskeletal: He exhibits no edema and no tenderness.       ROM appears intact, no obvious focal weakness  Neurological: He is alert and oriented to person, place, and time.  Skin: Skin is warm and dry. No rash noted.  Psychiatric: He has a normal mood and affect.    ED Course  Procedures (including critical care time)   Date: 03/17/2012  Rate: 105  Rhythm: sinus tachycardia with premature supraventricular complexes  QRS Axis: normal  Intervals: normal  ST/T Wave abnormalities: nonspecific T wave changes  Conduction Disutrbances:none  Narrative Interpretation:   Old EKG Reviewed: unchanged    Labs Reviewed  CBC WITH DIFFERENTIAL - Abnormal; Notable for the following:    WBC 11.1 (*)     Hemoglobin 18.3 (*)     HCT 52.4 (*)     Neutro Abs 8.0 (*)     All other components within normal limits  POCT I-STAT TROPONIN I  COMPREHENSIVE METABOLIC PANEL  PRO B NATRIURETIC PEPTIDE   Dg Chest 2 View  03/17/2012  *RADIOLOGY REPORT*  Clinical Data: Shortness of breath  CHEST - 2 VIEW  Comparison: 08/14/2011  Findings: Right subclavian dual lead pacemaker stable in position. Chronic elevation of right left diaphragmatic leaflet and blunting of the   left costophrenic angle.  Coarse linear parenchymal opacities in the mid and lower left lung are  stable.  Right lung clear, hyperinflated. Heart size within normal limits for technique.  Regional bones unremarkable.  IMPRESSION:  1.  Stable left pleural and parenchymal changes.  No acute or superimposed abnormality.   Original Report Authenticated By: Osa Craver, M.D.    Results for orders placed during the hospital encounter of 03/17/12  CBC WITH DIFFERENTIAL      Component Value Range   WBC 11.1 (*) 4.0 - 10.5 K/uL   RBC 5.73  4.22 - 5.81 MIL/uL   Hemoglobin 18.3 (*) 13.0 - 17.0 g/dL   HCT 16.1 (*) 09.6 - 04.5 %   MCV 91.4  78.0 - 100.0 fL   MCH 31.9  26.0 - 34.0 pg   MCHC 34.9  30.0 - 36.0 g/dL   RDW 40.9  81.1 - 91.4 %   Platelets 209  150 - 400 K/uL   Neutrophils Relative 72  43 - 77 %   Neutro Abs 8.0 (*) 1.7 - 7.7 K/uL   Lymphocytes Relative 20  12 - 46 %   Lymphs Abs 2.2  0.7 - 4.0 K/uL   Monocytes Relative 7  3 - 12 %   Monocytes Absolute 0.8  0.1 - 1.0 K/uL   Eosinophils Relative 1  0 - 5 %   Eosinophils Absolute 0.1  0.0 - 0.7 K/uL   Basophils Relative 0  0 - 1 %   Basophils Absolute 0.0  0.0 - 0.1 K/uL  COMPREHENSIVE METABOLIC PANEL      Component Value Range   Sodium 139  135 - 145 mEq/L   Potassium 5.5 (*) 3.5 - 5.1 mEq/L   Chloride 101  96 - 112 mEq/L   CO2 23  19 - 32 mEq/L   Glucose, Bld 126 (*) 70 - 99 mg/dL   BUN 41 (*) 6 - 23 mg/dL   Creatinine, Ser 7.82 (*) 0.50 - 1.35 mg/dL   Calcium 95.6  8.4 - 21.3 mg/dL  Total Protein 8.4 (*) 6.0 - 8.3 g/dL   Albumin 4.2  3.5 - 5.2 g/dL   AST 17  0 - 37 U/L   ALT 11  0 - 53 U/L   Alkaline Phosphatase 97  39 - 117 U/L   Total Bilirubin 0.5  0.3 - 1.2 mg/dL   GFR calc non Af Amer 30 (*) >90 mL/min   GFR calc Af Amer 35 (*) >90 mL/min  PRO B NATRIURETIC PEPTIDE      Component Value Range   Pro B Natriuretic peptide (BNP) 2427.0 (*) 0 - 125 pg/mL  POCT I-STAT TROPONIN I      Component Value Range   Troponin i, poc 0.02  0.00 - 0.08 ng/mL   Comment 3            Dg Chest 2 View  03/17/2012   *RADIOLOGY REPORT*  Clinical Data: Shortness of breath  CHEST - 2 VIEW  Comparison: 08/14/2011  Findings: Right subclavian dual lead pacemaker stable in position. Chronic elevation of right left diaphragmatic leaflet and blunting of the   left costophrenic angle.  Coarse linear parenchymal opacities in the mid and lower left lung are stable.  Right lung clear, hyperinflated. Heart size within normal limits for technique.  Regional bones unremarkable.  IMPRESSION:  1.  Stable left pleural and parenchymal changes.  No acute or superimposed abnormality.   Original Report Authenticated By: Thora Lance III, M.D.     1. CHF 2. Hyperkalemia 3. Renal insufficiency 4. dehydration   MDM  Pt with cp, sob progressive x 1 week.  Currently CP free.  Pt has hx of cardiac disease and also hx of CHF.  No evidence of pedal edema or rales on exam.  Pt does appears dehydrated.  Will give small amount of IVF.  Work up [employer].  Discussed with my attending.    7:54 PM Patient has a potassium level of 5.5 but no significant EKG changes. His renal function is elevated with a BUN of 41 a creatinine of 2.12 respectively. He also has a GFR of 35 which supports evidence of dehydration. Patient has a pro BNP of 2427 which is markedly elevated from his baseline. He has a mild leukocytosis of 11.1.  his chest x-ray shows no significant finding.due to increased renal enzyme, I will witheld from giving patient any diuretic.  Kayexalate 30g given  I will consult outpatient clinic for admission.   8:46 PM I have consulted with Internal Medicine Dr. Tonny Branch, who agrees to see pt in ED and will admit.  Will place pt on tele bed.  Pt agrees with plan.    BP 133/93  Pulse 79  Temp 98.5 F (36.9 C) (Oral)  Resp 16  SpO2 97%  I have reviewed nursing notes and vital signs. I personally reviewed the imaging tests through PACS system  I reviewed available ER/hospitalization records thought the EMR    Fayrene Helper,  [place] 03/17/12 2047

## 2012-03-17 NOTE — ED Notes (Signed)
Patient transported to X-ray 

## 2012-03-17 NOTE — ED Notes (Signed)
PT states coughing up white phelgm

## 2012-03-18 ENCOUNTER — Inpatient Hospital Stay (HOSPITAL_COMMUNITY): Payer: Medicare Other

## 2012-03-18 DIAGNOSIS — I509 Heart failure, unspecified: Secondary | ICD-10-CM

## 2012-03-18 LAB — D-DIMER, QUANTITATIVE: D-Dimer, Quant: 2.47 ug/mL-FEU — ABNORMAL HIGH (ref 0.00–0.48)

## 2012-03-18 LAB — URINALYSIS, ROUTINE W REFLEX MICROSCOPIC
Glucose, UA: NEGATIVE mg/dL
pH: 5.5 (ref 5.0–8.0)

## 2012-03-18 LAB — URINE MICROSCOPIC-ADD ON

## 2012-03-18 LAB — CREATININE, URINE, RANDOM: Creatinine, Urine: 242.02 mg/dL

## 2012-03-18 LAB — BASIC METABOLIC PANEL
CO2: 19 mEq/L (ref 19–32)
Chloride: 102 mEq/L (ref 96–112)
Sodium: 135 mEq/L (ref 135–145)

## 2012-03-18 LAB — T4, FREE: Free T4: 1.7 ng/dL (ref 0.80–1.80)

## 2012-03-18 LAB — MAGNESIUM: Magnesium: 2.4 mg/dL (ref 1.5–2.5)

## 2012-03-18 LAB — SODIUM, URINE, RANDOM: Sodium, Ur: 98 mEq/L

## 2012-03-18 MED ORDER — WARFARIN SODIUM 7.5 MG PO TABS
7.5000 mg | ORAL_TABLET | Freq: Once | ORAL | Status: AC
  Administered 2012-03-18: 7.5 mg via ORAL
  Filled 2012-03-18: qty 1

## 2012-03-18 MED ORDER — TECHNETIUM TO 99M ALBUMIN AGGREGATED
6.0000 | Freq: Once | INTRAVENOUS | Status: AC | PRN
  Administered 2012-03-18: 6 via INTRAVENOUS

## 2012-03-18 MED ORDER — WARFARIN - PHARMACIST DOSING INPATIENT: Freq: Every day | Status: DC

## 2012-03-18 MED ORDER — LEVOFLOXACIN 750 MG PO TABS: 750.0000 mg | ORAL_TABLET | ORAL | Status: DC

## 2012-03-18 MED ORDER — ENOXAPARIN SODIUM 80 MG/0.8ML ~~LOC~~ SOLN
65.0000 mg | Freq: Two times a day (BID) | SUBCUTANEOUS | Status: DC
  Administered 2012-03-18 – 2012-03-19 (×2): 65 mg via SUBCUTANEOUS
  Filled 2012-03-18 (×4): qty 0.8

## 2012-03-18 MED ORDER — LEVOFLOXACIN 500 MG PO TABS
500.0000 mg | ORAL_TABLET | Freq: Every day | ORAL | Status: DC
  Filled 2012-03-18: qty 1

## 2012-03-18 MED ORDER — TECHNETIUM TC 99M DIETHYLENETRIAME-PENTAACETIC ACID: 40.0000 | Freq: Once | INTRAVENOUS | Status: DC | PRN

## 2012-03-18 MED ORDER — ENSURE COMPLETE PO LIQD
237.0000 mL | Freq: Two times a day (BID) | ORAL | Status: DC
  Administered 2012-03-18 – 2012-03-19 (×3): 237 mL via ORAL

## 2012-03-18 MED ORDER — LEVOFLOXACIN 750 MG PO TABS
750.0000 mg | ORAL_TABLET | Freq: Once | ORAL | Status: AC
  Administered 2012-03-18: 750 mg via ORAL
  Filled 2012-03-18 (×2): qty 1

## 2012-03-18 NOTE — Progress Notes (Signed)
Subjective:    Interval Events:  Patient is without complain this morning and does not report recurrence of chest pain.  He has no abdominal pain or dyspnea.  He has been having a cough.   Incidentally, he reports significant weight loss over the past year.  He is a current smoker and has been for 40 years.    Objective:    Vital Signs:   Temp:  [97.6 F (36.4 C)-98.8 F (37.1 C)] 97.6 F (36.4 C) (10/30 1324) Pulse Rate:  [75-116] 87  (10/30 1324) Resp:  [16-20] 20  (10/30 1324) BP: (118-160)/(82-102) 118/82 mmHg (10/30 1324) SpO2:  [91 %-99 %] 91 % (10/30 1424) Weight:  [139 lb 3.2 oz (63.141 kg)-145 lb 3.2 oz (65.862 kg)] 145 lb 3.2 oz (65.862 kg) (10/30 0613) Last BM Date: 03/17/12   Weights: Weight 1 year ago:  72.6kg  Filed Weights   03/17/12 2251 03/18/12 0613  Weight: 139 lb 3.2 oz (63.141 kg) 145 lb 3.2 oz (65.862 kg)   1 yr weight loss:  6.7 kg (9.2%)   Intake/Output:   Intake/Output Summary (Last 24 hours) at 03/18/12 1444 Last data filed at 03/18/12 1300  Gross per 24 hour  Intake   2083 ml  Output    400 ml  Net   1683 ml      Physical Exam: GENERAL:  alert and oriented; resting comfortably in bed and in no distress EYES:  pupils equal, round, and reactive to light LUNGS:  clear to auscultation bilaterally, normal work of breathing HEART:  normal rate; regular rhythm; normal S1 and S2, no S3 or S4 appreciated; no murmurs, rubs, or clicks ABDOMEN:  soft, non-tender, normal bowel sounds, no masses palpated SKIN:  normal turgor    Labs: Basic Metabolic Panel: Lab 03/18/12 0632 03/18/12 03/17/12 1700  NA 135 -- 139  K 5.2* -- 5.5*  CL 102 -- 101  CO2 19 -- 23  GLUCOSE 123* -- 126*  BUN 42* -- 41*  CREATININE 2.17* -- 2.12*  CALCIUM 8.8 -- 10.4  MG -- 2.4 --  PHOS -- -- --   Liver Function Tests: Lab 03/17/12 1700  AST 17  ALT 11  ALKPHOS 97  BILITOT 0.5  PROT 8.4*  ALBUMIN 4.2   CBC: Lab 03/17/12 1700  WBC 11.1*  NEUTROABS 8.0*   HGB 18.3*  HCT 52.4*  MCV 91.4  PLT 209   Cardiac Enzymes: Lab 03/18/12 1100 03/18/12 0632 03/18/12  CKTOTAL -- -- --  CKMB -- -- --  CKMBINDEX -- -- --  TROPONINI <0.30 <0.30 <0.30   BNP (last 3 results): Basename 03/17/12 1645  PROBNP 2427.0*   Coagulation: D-Dimer:  2.47*  Urinalysis Component Value Date/Time   COLORURINE YELLOW 03/18/2012 0900   APPEARANCEUR HAZY* 03/18/2012 0900   LABSPEC 1.027 03/18/2012 0900   PHURINE 5.5 03/18/2012 0900   GLUCOSEU NEGATIVE 03/18/2012 0900   HGBUR NEGATIVE 03/18/2012 0900   HGBUR trace-intact 09/01/2008 1533   BILIRUBINUR SMALL* 03/18/2012 0900   KETONESUR NEGATIVE 03/18/2012 0900   PROTEINUR NEGATIVE 03/18/2012 0900   UROBILINOGEN 1.0 03/18/2012 0900   NITRITE NEGATIVE 03/18/2012 0900   LEUKOCYTESUR SMALL* 03/18/2012 0900   Urine Chemistry: Creatinine:  242.02 Sodium:  98 Calculated FENa:  0.65%   Other results: EKG Results:  03/18/2012 Rate:  86 PR:  146 QRS:  78 QTc:  452 EKG: normal EKG, normal sinus rhythm, unchanged from previous tracings.  Imaging: Dg Chest 2 View 03/17/2012  FINDINGS: Right subclavian  dual lead pacemaker stable in position. Chronic elevation of right left diaphragmatic leaflet and blunting of the   left costophrenic angle.  Coarse linear parenchymal opacities in the mid and lower left lung are stable.  Right lung clear, hyperinflated. Heart size within normal limits for technique.  Regional bones unremarkable.  IMPRESSION:  1.  Stable left pleural and parenchymal changes.  No acute or superimposed abnormality.  Nm Pulmonary Perf And Vent 03/18/2012  FINDINGS: The ventilation scan demonstrates heterogeneous opacification of the lungs with central clumping of the radiopharmaceutical.  Findings most likely due to emphysematous changes.  No segmental defects.  The perfusion lung scan demonstrates heterogeneous activity with diffuse abnormality.  No discrete segmental or subsegmental perfusion  defects.  A small volume left hemithorax is again demonstrated. IMPRESSION: Low probability ventilation perfusion lung scan for pulmonary embolism. Diffuse abnormality likely due to emphysema.     Medications:    Infusions:    . sodium chloride 150 mL/hr (03/18/12 1100)     Scheduled Medications:    . albuterol  2.5 mg Nebulization Q6H   And  . ipratropium  0.5 mg Nebulization Q6H  . buPROPion  75 mg Oral q morning - 10a  . diltiazem  240 mg Oral q morning - 10a  . dronedarone  400 mg Oral q morning - 10a  . enoxaparin (LOVENOX) injection  65 mg Subcutaneous Q12H  . feeding supplement  237 mL Oral BID BM  . influenza  inactive virus vaccine  0.5 mL Intramuscular Tomorrow-1000  . moxifloxacin  400 mg Intravenous Q24H  . ondansetron (ZOFRAN) IV  4 mg Intravenous Once  . pantoprazole  40 mg Oral Daily  . predniSONE  60 mg Oral Q breakfast  . sodium chloride  3 mL Intravenous Q12H  . sodium polystyrene  30 g Oral Once  . warfarin  7.5 mg Oral ONCE-1800  . Warfarin - Pharmacist Dosing Inpatient   Does not apply q1800  . DISCONTD: heparin  5,000 Units Subcutaneous Q8H  . DISCONTD: ondansetron (ZOFRAN) IV  4 mg Intravenous Once     PRN Medications: albuterol, ipratropium, nitroGLYCERIN, ondansetron (ZOFRAN) IV, ondansetron, oxyCODONE, polyethylene glycol, technetium albumin aggregated, technetium TC 34M diethylenetriame-pentaacetic acid    Assessment/ Plan:    1.   Chest pain:  ACS is unlikely given the normal EKG and negative troponins.  He has had a cough for the past couple of weeks that may represent a COPD exacerbation, but may have resulted in musculoskeletal injury of the thoracic wall; of note, palpation of the chest wall does not reproduce the pain, but coughing does exacerbate the pain.  PE is certainly a possibility.  According to the revised Geneva score, his pretest probability is ~28%.  The low probability V/Q (LR=0.25) gives a post-test probability of 9%, which  does not sufficiently rule him out.  He needs long-term anti-coagulation for other reasons so we will not work VTE further.  He does not report a pleuritic pain, but he does have a history of asbestos exposure and left-sided pleural thickening and parenchymal scarring.  This chest pain may be pleurisy, or it could be lung cancer; his history of asbestos exposure and tobacco abuse for 40 years makes him high risk.  In fact, he qualifies for annual low-dose CT screening for lung cancer at this point.  Pneumonia is unlikely with a negative CXR and GERD is possible but less likely given the history.  We will get a chest CT to rule out lung  cancer. - non contrast chest CT  2.   Acute COPD exacerbation:  The history of increased dyspnea and productive cough meets 2 of the 3 cardinal signs of COPD exacerbations with complicating factor of age > 82.  I do not see PFTs on him, so unless his FEV1 is < 50%, he is not at risk for Pseudomonas.  We are treating with scheduled albuterol and ipratropium, moxifloxacin->levofloxacin (day 2 today), and a prednisone taper (day 1 of 9).  At home, he takes tiotropium infrequently. - levofloxacin 750 mg PO every 48 hours for renal dosing (2/5) - prednisone 60mg  (1/3) - scheduled albuterol and ipratropium and as needed  3.   Acute on chronic renal failure:  Baseline creatinine is ~1.3.  It was up to 2.12 at admission.  FENa, in the absence of diuretics, was 0.65%, suggesting pre-renal insult.  This is most likely hypovolemia from dehydration.  Acute heart failure can also present with a prerenal AKI, but he reports decreased PO intake over the past several days.  We hydrated with normal saline at 126mL/hr for 12 hours and he is now eating and drinking adequately.  We will continue to monitor his creatinine.  4.   Chronic systolic heart failure:  Echocardiography from 2011 showed an EF of 45% with global hypokinesis.  Although his proBNP was elevated to 2427 at admission, he  appears euvolemic on exam.  This elevation in BNP is probably from renal failure rather than heart failure. - strict intake and output - daily weights  5.   Paroxysmal atrial fibrillation:  Long history of this but currently in sinus rhythm.  He is followed by cardiology for this and was previously anti-coagulated and rhythm controlled with diltiazem and dronedarone.  He has continued his antiarrhythmics but has failed to continue taking warfarin.  We will continue his antiarrhythmics here and restart anti-coagluation therapy with enoxaparin bridging to warfarin with pharmacy's assistance.  - diltiazem 240mg  daily - dronedarone 400mg  daily - enoxaparin 65mg  BID - warfarin 7.5mg  daily  6.   Hyperkalemia:  Potassium was 5.5 on admission.  Kayexalate was administered and his potassium decreased to 5.2 the morning following admission.  This is likely secondary to acute renal insufficiency.  We will hold off on further treatment and monitor creatinine and potassium with a BMET tomorrow morning.  EKG has no associated changes. - BMET tomorrow  7.   Weakness and weight loss:  He has lost 6.7kg (9.2%) in the past 12 months.  He reports a poor appetite during this period.  For cancer screening, he is up to date with a normal colonoscopy in 2012.  He received diagnostic chest CTs in 2011 and 2012, both showing a stable pulmonary nodule.  He does meet requirments for annual low dose chest CTs for lung cancer screening.  We will repeat a diagnostic chest CT today.  Consider SPEP and UPEP.  8.   Severe malnutrition and underweight:  Body mass index is 18.15 kg/(m^2).  Feeding supplements provided.  9.   Depression:  Reportedly takes bupropion 75mg  daily.  Last mention of depression in a clinic note was July 2012 and mirtazipine was prescribed.  We will try to reconcile this tomorrow. - bupropion 75mg  daily  10. Prophylaxis: - enoxaparin for VTE prevention - pantoprazole 40mg  daily while anti-coagulated and  taking prednisone  11. Disposition:  May be stable for d/c tomorrow; will need very close follow up with Dr. Alexandria Lodge and IM clinic.   Length of Stay: 1  days   Signed by:  Dorthula Rue. Earlene Plater, MD PGY-I, Internal Medicine Pager [phone]  03/18/2012, 5:34 PM

## 2012-03-18 NOTE — Progress Notes (Addendum)
INITIAL ADULT NUTRITION ASSESSMENT Date: 03/18/2012   Time: 9:32 AM Reason for Assessment: Consult, MST (Malnutrition Screening Tool)   INTERVENTION: 1. Ensure Complete po BID, each supplement provides 350 kcal and 13 grams of protein. 2. Multivitamin 3. Pt states he has no appetite, may benefit from an appetite stimulating medication 4. RD will continue to follow     DOCUMENTATION CODES Per approved criteria  -Severe malnutrition in the context of chronic illness -Underweight    ASSESSMENT: Male 66 y.o.  Dx: Chest pain  Hx:  Past Medical History  Diagnosis Date  . Hypertension   . Renal insufficiency     Renal U/S 12/04/2009 showed no pathological findings. Labs 12/04/2009 include normal ESR, C3, C4; neg ANA; SPEP showed nonspecific increase in the alpha-2 region with no M-spike; UPEP showed no monoclonal free light chains; urine IFE showed polyclonal increase in feree Kappa and/or free Lambda light chains.  . Chronic systolic heart failure   . Depression   . GERD (gastroesophageal reflux disease)   . Portal vein thrombosis   . Avascular necrosis     right hip s/p replacement  . Gastritis   . Alcohol abuse   . Erectile dysfunction   . Bipolar disorder   . Hydrocele, unspecified   . Intermittent claudication   . Edema   . Lung nodule     Chest CT scan on 12/14/2008 showed a nodular opacity at the left lung base felt to most likely represent scarring.  Follow-up chest CT scan on 06/02/2010 showed parenchymal scarring in the left apex, left  lower lobe, and lingula; some of this scarring at the left base had a nodular appearance, unchanged.  . 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  Thyroid scan 08/28/2009 was normal with no focal areas of abnormal increased or decreased activity seen; the uptake of I 131 sodium iodide at 24 hours was 5.7%.  TSH and free T4 normalized by 08/16/2009.  [name] Pneumonia   . Intestinal obstruction   .  Chronic systolic heart failure   . Other primary cardiomyopathies     2D echo on 02/06/2010 showed normal LV size with mild global hypokinesis, estimated EF 45%; mild biatrial enlargement; mild pulmonary hypertension (PA systolic pressure 42 mmHg assuming RA pressure 10 mmHg); normal RV size with mild systolic dysfunction.  [name] COPD (chronic obstructive pulmonary disease)   . Clostridium difficile colitis   . E coli bacteremia   . Cluster headaches   . DVT (deep venous thrombosis)     He has hypercoagulability with multiple occluded venous vessels  . Pleural plaque     Chest CT on 06/02/2010 showed stable extensive calcified pleural plaques involving the left hemithorax, consistent with asbestos related pleural disease.  . Weight loss     Normal colonoscopy by Dr. [name] on 11/06/2010.  . Atrial fibrillation with rapid ventricular response   . 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 Atrial fibrillation     Paroxysmal, failed medical therapy with amiodarone but has done very well with multaq  . Anginal pain     "every once in awhile now" (03/17/2012)  . Pacemaker   . Shortness of breath     "all the time" (03/17/2012)  . Osteoarthritis   . Spondylosis   . Chronic lower back pain     "cause of my hip" (03/17/2012)    Past Surgical History  Procedure Date  . Left hip bipolar hemiarthroplasty  09/09/2002    By Dr. Kristeen Miss  . Insert / replace / remove pacemaker 02/2003    By Dr. Charolette Child, gen change by Fawn Kirk  . Total hip arthroplasty 03/08/2008    right; By Dr. Charlesetta Shanks    Related Meds:     . albuterol  2.5 mg Nebulization Q6H   And  . ipratropium  0.5 mg Nebulization Q6H  . buPROPion  75 mg Oral q morning - 10a  . diltiazem  240 mg Oral q morning - 10a  . dronedarone  400 mg Oral q morning - 10a  . heparin  5,000 Units Subcutaneous Q8H  . influenza  inactive virus vaccine  0.5 mL Intramuscular Tomorrow-1000  . moxifloxacin  400 mg Intravenous  Q24H  . ondansetron (ZOFRAN) IV  4 mg Intravenous Once  . pantoprazole  40 mg Oral Daily  . predniSONE  60 mg Oral Q breakfast  . sodium chloride  3 mL Intravenous Q12H  . sodium polystyrene  30 g Oral Once  . DISCONTD: ondansetron (ZOFRAN) IV  4 mg Intravenous Once     Ht: 6\' 3"  (190.5 cm)  Wt: 145 lb 3.2 oz (65.862 kg)  Ideal Wt: 89 kg  % Ideal Wt: 73%  Usual Wt:  Wt Readings from Last 10 Encounters:  03/18/12 145 lb 3.2 oz (65.862 kg)  11/20/11 161 lb 1.6 oz (73.074 kg)  10/01/11 155 lb 8 oz (70.534 kg)  09/16/11 159 lb (72.122 kg)  08/14/11 165 lb (74.844 kg)  07/09/11 154 lb 14.4 oz (70.262 kg)  06/11/11 160 lb 1.6 oz (72.621 kg)  11/19/10 164 lb 4.8 oz (74.526 kg)  11/06/10 162 lb (73.483 kg)  10/29/10 161 lb 6.4 oz (73.211 kg)    % Usual Wt: 90%  Body mass index is 18.15 kg/(m^2). Pt is underweight per current BMI   Food/Nutrition Related Hx: Pt reports unintentional weight loss and poor appetite PTA per MST (Malnutrition Screening Tool)   Labs:  CMP     Component Value Date/Time   NA 135 03/18/2012 0632   K 5.2* 03/18/2012 0632   CL 102 03/18/2012 0632   CO2 19 03/18/2012 0632   GLUCOSE 123* 03/18/2012 0632   BUN 42* 03/18/2012 0632   CREATININE 2.17* 03/18/2012 0632   CREATININE 1.38* 08/14/2011 1233   CALCIUM 8.8 03/18/2012 0632   PROT 8.4* 03/17/2012 1700   ALBUMIN 4.2 03/17/2012 1700   AST 17 03/17/2012 1700   ALT 11 03/17/2012 1700   ALKPHOS 97 03/17/2012 1700   BILITOT 0.5 03/17/2012 1700   GFRNONAA 30* 03/18/2012 0632   GFRAA 34* 03/18/2012 0632      Intake/Output Summary (Last 24 hours) at 03/18/12 0936 Last data filed at 03/18/12 0900  Gross per 24 hour  Intake   1843 ml  Output    200 ml  Net   1643 ml     Diet Order: Cardiac  Supplements/Tube Feeding: none   IVF:    sodium chloride Last Rate: 150 mL/hr at 03/18/12 0056    Estimated Nutritional Needs:   Kcal: 1750-2000 Protein: 65-75 gm  Fluid: 1.8-2 L   Weight hx  indicates 16 lb (10% body weight) weight loss in the past 4 months, severe weight loss.  Pt states that his UBW was 201 lbs in February 2012, 56 lbs weight loss in over 1 year. States that since that time he has not had an appetite and typically does not eat anything all day except Ensure once  daily and some tea or coffee. Meeting <75% estimated nutrition needs for >1 month. Thinks he was depressed in the past, but denies depression a this time.   Nutrition Focused Physical Exam: Subcutaneous Fat:  Orbital Region: mild Upper Arm Region: mild Thoracic and Lumbar Region: severe  Muscle:  Temple Region: none Clavicle Bone Region: severe Clavicle and Acromion Bone Region: severe Scapular Bone Region: severe Dorsal Hand: N/A Patellar Region: mild Anterior Thigh Region: mild Posterior Calf Region: mild  Edema: none  Pt meets criteria for severe malnutrition in the context of chronic illness 2/2 weight loss, <75% estimated needs met for >1 month, and muscle/fat wasting as described above.   NUTRITION DIAGNOSIS: -Malnutrition (NI-5.2).  Status: Ongoing  RELATED TO: lack of appetite  AS EVIDENCE BY: weight loss, meeting <75% needs for >1 month, and fat/muscle wasting   MONITORING/EVALUATION(Goals): Goal: PO intake to meet >90% estimated nutrition needs  Monitor: PO intake, weight, labs, I/O's  EDUCATION NEEDS: -No education needs identified at this time    Clarene Duke RD, LDN Pager [phone] After Hours pager [phone]  03/18/2012, 9:32 AM

## 2012-03-18 NOTE — H&P (Signed)
Internal Medicine Teaching Service Attending Note Date: 03/18/2012  Patient name: Travis FINKEL Sr.  Medical record number: [id number]  Date of birth: 1946/04/10   I have seen and evaluated Travis Capuchin Sr. and discussed their care with the Residency Team.   66 yo M with afib with RVR, CHF, COPD (previous home O2) presents with 1 week of CP. Pain started centrally and radiated to the left. He complained of worsening SOB/DOE (even to go get his mail), cough with white sputum production as well.  This PM mostly he is concerned about his decreased appetite and wt loss.     [name] albuterol  2.5 mg Nebulization Q6H   And  . ipratropium  0.5 mg Nebulization Q6H  . buPROPion  75 mg Oral q morning - 10a  . diltiazem  240 mg Oral q morning - 10a  . dronedarone  400 mg Oral q morning - 10a  . feeding supplement  237 mL Oral BID BM  . heparin  5,000 Units Subcutaneous Q8H  . influenza  inactive virus vaccine  0.5 mL Intramuscular Tomorrow-1000  . moxifloxacin  400 mg Intravenous Q24H  . ondansetron (ZOFRAN) IV  4 mg Intravenous Once  . pantoprazole  40 mg Oral Daily  . predniSONE  60 mg Oral Q breakfast  . sodium chloride  3 mL Intravenous Q12H  . sodium polystyrene  30 g Oral Once  . DISCONTD: ondansetron (ZOFRAN) IV  4 mg Intravenous Once       . albuterol  2.5 mg Nebulization Q6H   And  . ipratropium  0.5 mg Nebulization Q6H  . buPROPion  75 mg Oral q morning - 10a  . diltiazem  240 mg Oral q morning - 10a  . dronedarone  400 mg Oral q morning - 10a  . heparin  5,000 Units Subcutaneous Q8H  . influenza  inactive virus vaccine  0.5 mL Intramuscular Tomorrow-1000  . moxifloxacin  400 mg Intravenous Q24H  . ondansetron (ZOFRAN) IV  4 mg Intravenous Once  . pantoprazole  40 mg Oral Daily  . predniSONE  60 mg Oral Q breakfast  . sodium chloride  3 mL Intravenous Q12H  . sodium polystyrene  30 g Oral Once  . DISCONTD: ondansetron (ZOFRAN) IV  4 mg Intravenous Once    Physical  Exam: Blood pressure 142/92, pulse 86, temperature 98 F (36.7 C), temperature source Oral, resp. rate 18, height 6\' 3"  (1.905 m), weight 65.862 kg (145 lb 3.2 oz), SpO2 94.00%. General appearance: alert, cooperative and no distress Eyes: negative findings: pupils equal, round, reactive to light and accomodation Neck: no adenopathy and supple, symmetrical, trachea midline Lungs: rhonchi bilaterally and mild Heart: regular rate and rhythm Abdomen: normal findings: bowel sounds normal and soft, non-tender Extremities: edema none  Lab results: Results for orders placed during the hospital encounter of 03/17/12 (from the past 24 hour(s))  PRO B NATRIURETIC PEPTIDE     Status: Abnormal   Collection Time   03/17/12  4:45 PM      Component Value Range   Pro B Natriuretic peptide (BNP) 2427.0 (*) 0 - 125 pg/mL  CBC WITH DIFFERENTIAL     Status: Abnormal   Collection Time   03/17/12  5:00 PM      Component Value Range   WBC 11.1 (*) 4.0 - 10.5 K/uL   RBC 5.73  4.22 - 5.81 MIL/uL   Hemoglobin 18.3 (*) 13.0 - 17.0 g/dL   HCT 40.9 (*) 81.1 -  52.0 %   MCV 91.4  78.0 - 100.0 fL   MCH 31.9  26.0 - 34.0 pg   MCHC 34.9  30.0 - 36.0 g/dL   RDW 16.1  09.6 - 04.5 %   Platelets 209  150 - 400 K/uL   Neutrophils Relative 72  43 - 77 %   Neutro Abs 8.0 (*) 1.7 - 7.7 K/uL   Lymphocytes Relative 20  12 - 46 %   Lymphs Abs 2.2  0.7 - 4.0 K/uL   Monocytes Relative 7  3 - 12 %   Monocytes Absolute 0.8  0.1 - 1.0 K/uL   Eosinophils Relative 1  0 - 5 %   Eosinophils Absolute 0.1  0.0 - 0.7 K/uL   Basophils Relative 0  0 - 1 %   Basophils Absolute 0.0  0.0 - 0.1 K/uL  COMPREHENSIVE METABOLIC PANEL     Status: Abnormal   Collection Time   03/17/12  5:00 PM      Component Value Range   Sodium 139  135 - 145 mEq/L   Potassium 5.5 (*) 3.5 - 5.1 mEq/L   Chloride 101  96 - 112 mEq/L   CO2 23  19 - 32 mEq/L   Glucose, Bld 126 (*) 70 - 99 mg/dL   BUN 41 (*) 6 - 23 mg/dL   Creatinine, Ser 4.09 (*) 0.50 -  1.35 mg/dL   Calcium 81.1  8.4 - 91.4 mg/dL   Total Protein 8.4 (*) 6.0 - 8.3 g/dL   Albumin 4.2  3.5 - 5.2 g/dL   AST 17  0 - 37 U/L   ALT 11  0 - 53 U/L   Alkaline Phosphatase 97  39 - 117 U/L   Total Bilirubin 0.5  0.3 - 1.2 mg/dL   GFR calc non Af Amer 30 (*) >90 mL/min   GFR calc Af Amer 35 (*) >90 mL/min  POCT I-STAT TROPONIN I     Status: Normal   Collection Time   03/17/12  5:02 PM      Component Value Range   Troponin i, poc 0.02  0.00 - 0.08 ng/mL   Comment 3           MAGNESIUM     Status: Normal   Collection Time   03/18/12 12:00 AM      Component Value Range   Magnesium 2.4  1.5 - 2.5 mg/dL  TROPONIN I     Status: Normal   Collection Time   03/18/12 12:00 AM      Component Value Range   Troponin I <0.30  <0.30 ng/mL  D-DIMER, QUANTITATIVE     Status: Abnormal   Collection Time   03/18/12 12:00 AM      Component Value Range   D-Dimer, Quant 2.47 (*) 0.00 - 0.48 ug/mL-FEU  TROPONIN I     Status: Normal   Collection Time   03/18/12  6:32 AM      Component Value Range   Troponin I <0.30  <0.30 ng/mL  BASIC METABOLIC PANEL     Status: Abnormal   Collection Time   03/18/12  6:32 AM      Component Value Range   Sodium 135  135 - 145 mEq/L   Potassium 5.2 (*) 3.5 - 5.1 mEq/L   Chloride 102  96 - 112 mEq/L   CO2 19  19 - 32 mEq/L   Glucose, Bld 123 (*) 70 - 99 mg/dL   BUN 42 (*) 6 -  23 mg/dL   Creatinine, Ser 3.08 (*) 0.50 - 1.35 mg/dL   Calcium 8.8  8.4 - 65.7 mg/dL   GFR calc non Af Amer 30 (*) >90 mL/min   GFR calc Af Amer 34 (*) >90 mL/min  URINALYSIS, ROUTINE W REFLEX MICROSCOPIC     Status: Abnormal   Collection Time   03/18/12  9:00 AM      Component Value Range   Color, Urine YELLOW  YELLOW   APPearance HAZY (*) CLEAR   Specific Gravity, Urine 1.027  1.005 - 1.030   pH 5.5  5.0 - 8.0   Glucose, UA NEGATIVE  NEGATIVE mg/dL   Hgb urine dipstick NEGATIVE  NEGATIVE   Bilirubin Urine SMALL (*) NEGATIVE   Ketones, ur NEGATIVE  NEGATIVE mg/dL    Protein, ur NEGATIVE  NEGATIVE mg/dL   Urobilinogen, UA 1.0  0.0 - 1.0 mg/dL   Nitrite NEGATIVE  NEGATIVE   Leukocytes, UA SMALL (*) NEGATIVE  URINE MICROSCOPIC-ADD ON     Status: Normal   Collection Time   03/18/12  9:00 AM      Component Value Range   Squamous Epithelial / LPF RARE  RARE   WBC, UA 7-10  <3 WBC/hpf   RBC / HPF 0-2  <3 RBC/hpf   Bacteria, UA RARE  RARE    Imaging results:  Dg Chest 2 View  03/17/2012  *RADIOLOGY REPORT*  Clinical Data: Shortness of breath  CHEST - 2 VIEW  Comparison: 08/14/2011  Findings: Right subclavian dual lead pacemaker stable in position. Chronic elevation of right left diaphragmatic leaflet and blunting of the   left costophrenic angle.  Coarse linear parenchymal opacities in the mid and lower left lung are stable.  Right lung clear, hyperinflated. Heart size within normal limits for technique.  Regional bones unremarkable.  IMPRESSION:  1.  Stable left pleural and parenchymal changes.  No acute or superimposed abnormality.   Original Report Authenticated By: Osa Craver, M.D.     Assessment and Plan: I agree with the formulated Assessment and Plan with the following changes:  Chest Pain COPD CRI HyperKalemia  Will continue anbx (change to levaquin or ceftriaxone/azithro) Steroid taper v/q (-) but D-dimer+ Hydrate as tolerated.  Cardiac enzymes are negative so far... TTE  Comment- need to restart his anti-coagulation due to his hx of portal vein thrombosis. Consider change his avelox to levaquin (for formulary concerns, not efficacy concerns)  Ginnie Smart, MD 10/30/201310:10 AM

## 2012-03-18 NOTE — H&P (Signed)
Pt seen, and discussed with Dr Collier Bullock. Agree with her a/p

## 2012-03-18 NOTE — Evaluation (Signed)
Physical Therapy Evaluation Patient Details Name: Travis JOSS Sr. 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  Will benefit from PT to address vestibular and balance issues.  Recommend Outpt. PT for vestibular rehab.  Also recommend patient to get a tub transfer bench.    PT Assessment  Patient needs continued PT services    Follow Up Recommendations  Outpatient PT;Supervision/Assistance - 24 hour    Does the patient have the potential to tolerate intense rehabilitation      Barriers to Discharge        Equipment Recommendations  Tub/shower bench    Recommendations for Other Services     Frequency Min 3X/week    Precautions / Restrictions Precautions Precautions: Fall Restrictions Weight Bearing Restrictions: No   Pertinent Vitals/Pain VSS, No pain      Mobility  Bed Mobility Bed Mobility: Supine to Sit Supine to Sit: 7: Independent Transfers Transfers: Sit to Stand;Stand to Sit Sit to Stand: 4: Min guard;With upper extremity assist;From bed Stand to Sit: 4: Min guard;With upper extremity assist;With armrests;To chair/3-in-1 Details for Transfer Assistance: Assessed for vestibular hypofunction secondary to patient dizzy with head turns. Head thrust to right appeared positive suggesting a right vestibular hypofunction.  Initiated x1 exercises with patient reporting some dizziness with exercise.  Educated patient to perform 2x, 3-5 x/day.   Ambulation/Gait Ambulation/Gait Assistance: 4: Min guard Ambulation Distance (Feet): 150 Feet Assistive device: None Ambulation/Gait Assistance Details: Pt. ambulates carefully and is safe overall with gait.  Practiced head turns which made patient dizzy.   Gait Pattern: Step-through pattern;Decreased stride length;Trunk flexed Gait  velocity: decreased Stairs: No Wheelchair Mobility Wheelchair Mobility: No              PT Diagnosis: Generalized weakness  PT Problem List: Decreased activity tolerance;Decreased balance;Decreased mobility;Decreased safety awareness;Decreased knowledge of use of DME PT Treatment Interventions: DME instruction;Gait training;Stair training;Functional mobility training;Therapeutic activities;Therapeutic exercise;Balance training;Patient/family education   PT Goals Acute Rehab PT Goals PT Goal Formulation: With patient Time For Goal Achievement: 03/25/12 Potential to Achieve Goals: Good Pt will go Sit to Stand: Independently PT Goal: Sit to Stand - Progress: Goal set today Pt will Ambulate: 51 - 150 feet;with modified independence;with least restrictive assistive device PT Goal: Ambulate - Progress: Goal set today Pt will Go Up / Down Stairs: 1-2 stairs;with supervision;with least restrictive assistive device PT Goal: Up/Down Stairs - Progress: Goal set today Additional Goals Additional Goal #1: Pt to perform x1 exercises in sitting with dizziness 0/10.   PT Goal: Additional Goal #1 - Progress: Goal set today  Visit Information  Last PT Received On: 03/18/12 Assistance Needed: +1    Subjective Data  Subjective: "I am doing as well as at home.  I get dizzy when I am walking and turning my head." Patient Stated Goal: To go home with friend   Prior Functioning  Home Living Lives With: Friend(s) Available Help at Discharge: Friend(s);Available 24 hours/day Type of Home: House Home Access: Stairs to enter [employer] of Steps: 2 Entrance Stairs-Rails: Right Home Layout: One level Bathroom Shower/Tub: Engineer, manufacturing systems: Standard Home Adaptive Equipment: Quad cane Prior Function Level of Independence: Independent with assistive device(s) (used quad cane outdoors and "walls" in house) Able to Take Stairs?: Yes Driving: Yes Vocation:  Retired Musician: No difficulties  Cognition  Overall Cognitive Status: Appears within functional limits for tasks assessed/performed Arousal/Alertness: Awake/alert Orientation Level: Appears intact for tasks assessed Behavior During Session: [facility] for tasks performed    Extremity/Trunk Assessment Right Lower Extremity Assessment RLE ROM/Strength/Tone: [facility] for tasks assessed Left Lower Extremity Assessment LLE ROM/Strength/Tone: [facility] for tasks assessed Trunk Assessment Trunk Assessment: Normal   Balance Static Standing Balance Static Standing - Balance Support: No upper extremity supported;During functional activity Static Standing - Level of Assistance: 5: Stand by assistance Static Standing - Comment/# of Minutes: 2 minutes   End of Session PT - End of Session Equipment Utilized During Treatment: Gait belt Activity Tolerance: Patient tolerated treatment well Patient left: in chair;with family/visitor present;with call bell/phone within reach Nurse Communication: Mobility status       [name],Travis Schubring 03/18/2012, 1:18 PM  [employer] [employer] Acute Rehabilitation [phone] (pager)

## 2012-03-18 NOTE — Progress Notes (Addendum)
ANTICOAGULATION CONSULT NOTE - Initial Consult  Pharmacy Consult:  Lovenox / Coumadin Indication:  History of atrial fibrillation and portal vein thrombosis  Allergies  Allergen Reactions  . Penicillins Rash    Patient Measurements: Height: 6\' 3"  (190.5 cm) Weight: 145 lb 3.2 oz (65.862 kg) IBW/kg (Calculated) : 84.5    Vital Signs: Temp: 98 F (36.7 C) (10/30 0613) Temp src: Oral (10/30 0613) BP: 148/88 mmHg (10/30 1052) Pulse Rate: 86  (10/30 0613)  Labs:  Basename 03/18/12 1100 03/18/12 1610 03/18/12 03/17/12 1700  HGB -- -- -- 18.3*  HCT -- -- -- 52.4*  PLT -- -- -- 209  APTT -- -- -- --  LABPROT -- -- -- --  INR -- -- -- --  HEPARINUNFRC -- -- -- --  CREATININE -- 2.17* -- 2.12*  CKTOTAL -- -- -- --  CKMB -- -- -- --  TROPONINI <0.30 <0.30 <0.30 --    Estimated Creatinine Clearance: 30.4 ml/min (by C-G formula based on Cr of 2.17).   Medical History: Past Medical History  Diagnosis Date  . Hypertension   . Renal insufficiency     Renal U/S 12/04/2009 showed no pathological findings. Labs 12/04/2009 include normal ESR, C3, C4; neg ANA; SPEP showed nonspecific increase in the alpha-2 region with no M-spike; UPEP showed no monoclonal free light chains; urine IFE showed polyclonal increase in feree Kappa and/or free Lambda light chains.  . Chronic systolic heart failure   . Depression   . GERD (gastroesophageal reflux disease)   . Portal vein thrombosis   . Avascular necrosis     right hip s/p replacement  . Gastritis   . Alcohol abuse   . Erectile dysfunction   . Bipolar disorder   . Hydrocele, unspecified   . Intermittent claudication   . Edema   . Lung nodule     Chest CT scan on 12/14/2008 showed a nodular opacity at the left lung base felt to most likely represent scarring.  Follow-up chest CT scan on 06/02/2010 showed parenchymal scarring in the left apex, left  lower lobe, and lingula; some of this scarring at the left base had a nodular appearance,  unchanged.  . 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  Thyroid scan 08/28/2009 was normal with no focal areas of abnormal increased or decreased activity seen; the uptake of I 131 sodium iodide at 24 hours was 5.7%.  TSH and free T4 normalized by 08/16/2009.  [name] Pneumonia   . Intestinal obstruction   . Chronic systolic heart failure   . Other primary cardiomyopathies     2D echo on 02/06/2010 showed normal LV size with mild global hypokinesis, estimated EF 45%; mild biatrial enlargement; mild pulmonary hypertension (PA systolic pressure 42 mmHg assuming RA pressure 10 mmHg); normal RV size with mild systolic dysfunction.  [name] COPD (chronic obstructive pulmonary disease)   . Clostridium difficile colitis   . E coli bacteremia   . Cluster headaches   . DVT (deep venous thrombosis)     He has hypercoagulability with multiple occluded venous vessels  . Pleural plaque     Chest CT on 06/02/2010 showed stable extensive calcified pleural plaques involving the left hemithorax, consistent with asbestos related pleural disease.  . Weight loss     Normal colonoscopy by Dr. [name] on 11/06/2010.  . Atrial fibrillation with rapid ventricular response   . 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 Atrial fibrillation  Paroxysmal, failed medical therapy with amiodarone but has done very well with multaq  . Anginal pain     "every once in awhile now" (03/17/2012)  . Pacemaker   . Shortness of breath     "all the time" (03/17/2012)  . Osteoarthritis   . Spondylosis   . Chronic lower back pain     "cause of my hip" (03/17/2012)       Assessment: 66 YOM with chest pain and SOB to start full-dose Lovenox and Coumadin for history of atrial fibrillation and portal vein thrombosis.  Low probability for PE per VQ scan.  Patient was previously on Coumadin but has stopped since August.  He has CKD with borderline CrCL for Lovenox dosing.  INR  not available but expect to be at baseline as patient has not been taking Coumadin.  Noted patient received heparin 5000 units SQ x 1 this AM.    Goal of Therapy:  Anti-Xa level 0.6-1.2 units/ml 4hrs after LMWH dose given INR 2 - 3 Monitor platelets by anticoagulation protocol: Yes    Plan:  - D/C SQ heparin - Coumadin 7.5mg  PO today - Lovenox 65mg  SQ Q12H - CBC Q72H while on Lovenox - Daily PT / INR - Monitor renal function and adjust Lovenox frequency as appropriate - Coumadin education completed, particularly stressed the importance of medication compliance     Nela Bascom D. Laney Potash, PharmD, BCPS Pager:  [phone] 03/18/2012, 1:48 PM

## 2012-03-18 NOTE — Progress Notes (Signed)
Utilization Review Completed.  

## 2012-03-19 ENCOUNTER — Telehealth: Payer: Self-pay | Admitting: *Deleted

## 2012-03-19 LAB — BASIC METABOLIC PANEL
BUN: 35 mg/dL — ABNORMAL HIGH (ref 6–23)
Calcium: 9.3 mg/dL (ref 8.4–10.5)
Creatinine, Ser: 1.75 mg/dL — ABNORMAL HIGH (ref 0.50–1.35)
GFR calc Af Amer: 44 mL/min — ABNORMAL LOW (ref >90)

## 2012-03-19 LAB — PROTIME-INR
INR: 1.06 (ref 0.00–1.49)
Prothrombin Time: 13.7 seconds (ref 11.6–15.2)

## 2012-03-19 MED ORDER — ENOXAPARIN SODIUM 100 MG/ML ~~LOC~~ SOLN: 100.0000 mg | Freq: Every morning | SUBCUTANEOUS | Status: DC

## 2012-03-19 MED ORDER — WARFARIN SODIUM 7.5 MG PO TABS
7.5000 mg | ORAL_TABLET | Freq: Once | ORAL | Status: AC
  Administered 2012-03-19: 7.5 mg via ORAL
  Filled 2012-03-19 (×2): qty 1

## 2012-03-19 MED ORDER — LEVOFLOXACIN 750 MG PO TABS: 750.0000 mg | ORAL_TABLET | ORAL | Status: AC

## 2012-03-19 MED ORDER — ENOXAPARIN SODIUM 100 MG/ML ~~LOC~~ SOLN
1.5000 mg/kg | SUBCUTANEOUS | Status: DC
  Filled 2012-03-19: qty 1

## 2012-03-19 MED ORDER — ENOXAPARIN (LOVENOX) PATIENT EDUCATION KIT
PACK | Freq: Once | Status: AC
  Administered 2012-03-19: 08:00:00
  Filled 2012-03-19 (×2): qty 1

## 2012-03-19 MED ORDER — WARFARIN SODIUM 5 MG PO TABS: 7.5000 mg | ORAL_TABLET | Freq: Every morning | ORAL | Status: DC

## 2012-03-19 MED ORDER — WARFARIN - PHYSICIAN DOSING INPATIENT: Freq: Every day | Status: DC

## 2012-03-19 MED ORDER — TIOTROPIUM BROMIDE MONOHYDRATE 18 MCG IN CAPS: 18.0000 ug | ORAL_CAPSULE | Freq: Every day | RESPIRATORY_TRACT | Status: DC

## 2012-03-19 MED ORDER — PREDNISONE 20 MG PO TABS: ORAL_TABLET | ORAL | Status: AC

## 2012-03-19 MED ORDER — ENOXAPARIN SODIUM 30 MG/0.3ML ~~LOC~~ SOLN
30.0000 mg | Freq: Once | SUBCUTANEOUS | Status: AC
  Administered 2012-03-19: 30 mg via SUBCUTANEOUS
  Filled 2012-03-19: qty 0.3

## 2012-03-19 MED ORDER — TIOTROPIUM BROMIDE MONOHYDRATE 18 MCG IN CAPS
18.0000 ug | ORAL_CAPSULE | Freq: Every day | RESPIRATORY_TRACT | Status: DC
  Administered 2012-03-19: 18 ug via RESPIRATORY_TRACT
  Filled 2012-03-19: qty 5

## 2012-03-19 MED ORDER — WARFARIN SODIUM 5 MG PO TABS
7.5000 mg | ORAL_TABLET | Freq: Every day | ORAL | Status: DC
  Filled 2012-03-19: qty 1.5

## 2012-03-19 NOTE — Progress Notes (Addendum)
Physical Therapy Treatment Patient Details Name: Travis BLECH Sr. 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  Pt demonstrates improved conditions with less dizziness after head movements and with vestibular exercises.  He remains with balance problems and shortness of breath with walking.  Recommend Outpt. PT for balance, cardiovascular endurance, and vestibular rehab.     Follow Up Recommendations   Outpt PT for vestibular rehab (pt needs prescription for this)     Does the patient have the potential to tolerate intense rehabilitation     Barriers to Discharge None      Equipment Recommendations    Tub/shower bench   Recommendations for Other Services    Frequency   3x/week  Plan Discharge plan remains appropriate    Precautions / Restrictions Precautions Precautions: Fall Precaution Comments: Fall risk due to poor balance and slowed gait speed Restrictions Weight Bearing Restrictions: No   Pertinent Vitals/Pain No apparent distress, no pain    Mobility  Bed Mobility Bed Mobility: Supine to Sit Supine to Sit: 7: Independent Transfers Transfers: Sit to Stand;Stand to Sit Sit to Stand: 7: Independent Stand to Sit: 7: Independent Ambulation/Gait Ambulation/Gait Assistance: 4: Min guard Ambulation Distance (Feet): 170 Feet Assistive device: None Ambulation/Gait Assistance Details: Pt states he got short of breath, noting this is typical when he walks. He denies dizziness with walking, turning, and looking around.  Gait Pattern: Step-through pattern;Decreased stride length;Trunk flexed Gait velocity: decreased Stairs: No Wheelchair Mobility Wheelchair Mobility: No    Exercises Other Exercises Other Exercises: Vestibular, head shake, exercises given to correct  Right inner ear hypofunction; pt instructed to hold large letter A at arms length in front of him, maintain eye contact, and shake head right and left quickly for x 2, 3 x/day; in standing in front of a chair or couch.  Other Exercies:  Gave patient a handout outlining instructions for a walking program, starting at 3 minutes, 3xday and progressing 1 minute each day with further progression outlined on handout.  Progressed this program slowly given patient's SOB with activity and explained how to use RPE scale to monitor progression.     PT Diagnosis: Generalized weakness;Abnormality of gait  PT Problem List: Decreased activity tolerance;Decreased balance;Decreased mobility PT Goals Acute Rehab PT Goals PT Goal: Sit to Stand - Progress: Met PT Goal: Ambulate - Progress: Progressing toward goal  Visit Information  Last PT Received On: 03/19/12 Assistance Needed:  (No assistance needed, guarding was used but not required)    Subjective Data  Subjective: "I feel better today and did the head shake exercises 2x last night and 1x this morning.  I am not feeling dizzy today and I'm not sure why I was like that yesterday." Patient Stated Goal: To go home   Cognition  Overall Cognitive Status: Appears within functional limits for tasks assessed/performed Arousal/Alertness: Awake/alert Orientation Level: Appears intact for tasks assessed Behavior During Session: [facility] for tasks performed    Balance  Standardized Balance Assessment Standardized Balance Assessment: Dynamic Gait Index Dynamic Gait Index Level Surface: Mild Impairment Change in Gait Speed: Moderate Impairment Gait with Horizontal Head Turns: Moderate Impairment Gait with Vertical Head Turns: Mild Impairment Gait and Pivot Turn: Normal Step Over Obstacle: Mild Impairment Step Around Obstacles: Mild Impairment Steps: Mild Impairment Total Score: 15 ; Scored 15/24 on  DGI suggesting patient at risk for falls with dynamic  balance activities.  Patient reports use of quad cane on unlevel surfaces.  Recommended pt continue to use quad cane as needed.    End of Session PT - End of Session Equipment Utilized During Treatment: Gait belt Activity Tolerance: Patient tolerated treatment well Patient left: in bed;with call bell/phone within reach Nurse Communication: Mobility status        Sharion Balloon 03/19/2012, 11:24 AM Sharion Balloon, SPT Acute Rehab Services [phone]   [facility] Acute Rehabilitation [phone] (pager)

## 2012-03-19 NOTE — Progress Notes (Signed)
PT NOTE Pt changed his mind about using his quad cane while walking at home; he would feel more comfortable with a rolling walker.  Mr. Eifler walked well during his PT session.  However, given his DGI score of 15/24, PT agrees with pt's request for rolling walker which will help to decrease his risk of falls and increase his activity tolerance.  Sharion Balloon, SPT Acute Rehab Services [phone]

## 2012-03-19 NOTE — Progress Notes (Signed)
Internal Medicine Teaching Service Attending Note Date: 03/19/2012  Patient name: Travis DAKIN Sr.  Medical record number: [id number]  Date of birth: 1946-04-10   I have seen and evaluated Travis Capuchin Sr. and discussed their care with the Residency Team.   Without complaints    . buPROPion  75 mg Oral q morning - 10a  . diltiazem  240 mg Oral q morning - 10a  . dronedarone  400 mg Oral q morning - 10a  . enoxaparin (LOVENOX) injection  1.5 mg/kg Subcutaneous Q24H  . enoxaparin (LOVENOX) injection  30 mg Subcutaneous Once  . enoxaparin   Does not apply Once  . feeding supplement  237 mL Oral BID BM  . influenza  inactive virus vaccine  0.5 mL Intramuscular Tomorrow-1000  . levofloxacin  750 mg Oral Once  . levofloxacin  750 mg Oral Q48H  . pantoprazole  40 mg Oral Daily  . predniSONE  60 mg Oral Q breakfast  . sodium chloride  3 mL Intravenous Q12H  . tiotropium  18 mcg Inhalation Daily  . warfarin  7.5 mg Oral ONCE-1800  . warfarin  7.5 mg Oral Once  . Warfarin - Physician Dosing Inpatient   Does not apply q1800  . DISCONTD: albuterol  2.5 mg Nebulization Q6H  . DISCONTD: enoxaparin (LOVENOX) injection  65 mg Subcutaneous Q12H  . DISCONTD: heparin  5,000 Units Subcutaneous Q8H  . DISCONTD: ipratropium  0.5 mg Nebulization Q6H  . DISCONTD: levofloxacin  500 mg Oral q1800  . DISCONTD: moxifloxacin  400 mg Intravenous Q24H  . DISCONTD: warfarin  7.5 mg Oral q1800  . DISCONTD: Warfarin - Pharmacist Dosing Inpatient   Does not apply q1800     Physical Exam: Blood pressure 104/67, pulse 81, temperature 98.6 F (37 C), temperature source Oral, resp. rate 16, height 6\' 3"  (1.905 m), weight 65.998 kg (145 lb 8 oz), SpO2 91.00%. General appearance: alert, cooperative and no distress Resp: rhonchi bilaterally Cardio: regular rate and rhythm GI: normal findings: bowel sounds normal and soft, non-tender  Lab results: Results for orders placed during the hospital encounter of  03/17/12 (from the past 24 hour(s))  PROTIME-INR     Status: Normal   Collection Time   03/19/12  5:40 AM      Component Value Range   Prothrombin Time 13.7  11.6 - 15.2 seconds   INR 1.06  0.00 - 1.49  BASIC METABOLIC PANEL     Status: Abnormal   Collection Time   03/19/12  5:40 AM      Component Value Range   Sodium 140  135 - 145 mEq/L   Potassium 4.7  3.5 - 5.1 mEq/L   Chloride 105  96 - 112 mEq/L   CO2 25  19 - 32 mEq/L   Glucose, Bld 107 (*) 70 - 99 mg/dL   BUN 35 (*) 6 - 23 mg/dL   Creatinine, Ser 4.09 (*) 0.50 - 1.35 mg/dL   Calcium 9.3  8.4 - 81.1 mg/dL   GFR calc non Af Amer 38 (*) >90 mL/min   GFR calc Af Amer 44 (*) >90 mL/min    Imaging results:  Dg Chest 2 View  03/17/2012  *RADIOLOGY REPORT*  Clinical Data: Shortness of breath  CHEST - 2 VIEW  Comparison: 08/14/2011  Findings: Right subclavian dual lead pacemaker stable in position. Chronic elevation of right left diaphragmatic leaflet and blunting of the   left costophrenic angle.  Coarse linear parenchymal opacities in the  mid and lower left lung are stable.  Right lung clear, hyperinflated. Heart size within normal limits for technique.  Regional bones unremarkable.  IMPRESSION:  1.  Stable left pleural and parenchymal changes.  No acute or superimposed abnormality.   Original Report Authenticated By: Osa Craver, M.D.    Ct Chest Wo Contrast  03/18/2012  *RADIOLOGY REPORT*  Clinical Data: Cough and shortness of breath.  History of asbestos exposure.  CT CHEST WITHOUT CONTRAST  Technique:  Multidetector CT imaging of the chest was performed following the standard protocol without IV contrast.  Comparison: CT chest 12/14/2008 and 06/02/2010.  Findings: Volume loss is again seen in the left chest with extensive pleural calcification. Mild degree of calcified pleural plaque formation is identified on the right.  The appearance is unchanged.  There is no pleural or pericardial effusion.  Heart size is normal.  No  axillary, hilar mediastinal lymphadenopathy is identified.  Calcific aortic and coronary atherosclerotic vascular disease is seen.  Pacing device is noted.  Lungs demonstrate unchanged scarring on the left.  A nodular opacity in the right middle lobe measuring 0.5 cm is identified on image 36 and stable in appearance.  Emphysematous change is noted. There is some bronchiectatic change in the bases.  The lungs are otherwise unremarkable.  Incidentally imaged upper abdomen shows distention of the stomach.  No focal bony abnormality is identified.  IMPRESSION:  1.  No acute finding in the chest. 2.  Volume loss and pulmonary scar in the left chest. 3.  Bilateral calcified pleural plaques compatible with asbestos exposure. 4.  Gaseous distention of the stomach. 5.  Calcific coronary and aortic atherosclerotic vascular disease.   Original Report Authenticated By: Bernadene Bell. D'ALESSIO, M.D.    Nm Pulmonary Perf And Vent  03/18/2012  *RADIOLOGY REPORT*  Clinical Data:  Shortness of breath and elevated D-dimer.  NUCLEAR MEDICINE VENTILATION - PERFUSION LUNG SCAN  Technique:  Wash-in, equilibrium, and wash-out phase ventilation images were obtained using aerosolized technetium DTPA.  Perfusion images were obtained in multiple projections after intravenous injection of Tc-3m MAA.  Radiopharmaceuticals:  40 mCi DTPA and 6.0 mCi Tc-45m MAA.  Comparison:  Chest x-ray 03/17/2012.  Findings: The ventilation scan demonstrates heterogeneous opacification of the lungs with central clumping of the radiopharmaceutical.  Findings most likely due to emphysematous changes.  No segmental defects.  The perfusion lung scan demonstrates heterogeneous activity with diffuse abnormality.  No discrete segmental or subsegmental perfusion defects.  A small volume left hemithorax is again demonstrated.  IMPRESSION: Low probability ventilation perfusion lung scan for pulmonary embolism. Diffuse abnormality likely due to emphysema.   Original  Report Authenticated By: P. Loralie Champagne, M.D.     Assessment and Plan: I agree with the formulated Assessment and Plan with the following changes:   Chest pain  COPD exacerbation ARF on CRF Expect his CP was due musculoskeletal from his COPD exacerbation.  CT chest does not show evidence of malignancy (asbestos is noted however).  Plan is for pt to go home and complete anbx and streoid therapy there.

## 2012-03-19 NOTE — Progress Notes (Signed)
[name],PT Acute Rehabilitation [phone] (pager)  

## 2012-03-19 NOTE — Care Management Note (Signed)
    Page 1 of 1   03/19/2012     1:17:28 PM   CARE MANAGEMENT NOTE 03/19/2012  Patient:  [name], [name]   Account Number:  [id number]  Date Initiated:  03/19/2012  Documentation initiated by:  Tera Mater  Subjective/Objective Assessment:   66yo male admitted with  Dehydration.  Pt. lives at home with significant other.     Action/Plan:   Discharge planning   Anticipated DC Date:  03/19/2012   Anticipated DC Plan:  HOME/SELF CARE      DC Planning Services  CM consult  Medication Assistance      Choice offered to / List presented to:             Status of service:  Completed, signed off Medicare Important Message given?   (If response is "NO", the following Medicare IM given date fields will be blank) Date Medicare IM given:   Date Additional Medicare IM given:    Discharge Disposition:  HOME/SELF CARE  Per UR Regulation:  Reviewed for med. necessity/level of care/duration of stay  If discussed at Long Length of Stay Meetings, dates discussed:    Comments:  03/19/12 1315 Spoke with physician regarding medication assistance for pt.  Unable to assist with medications, due to pt. has insurance coverage.  Tera Mater, RN, BSN NCM [phone]

## 2012-03-19 NOTE — Clinical Documentation Improvement (Signed)
CHEST PAIN DOCUMENTATION CLARIFICATION QUERY  THIS DOCUMENT IS NOT A PERMANENT PART OF THE MEDICAL RECORD          03/19/12  Dr. Earlene Plater and/or Associates,  In an effort to better capture your patient's severity of illness, reflect appropriate length of stay and utilization of resources, a review of the patient medical record has revealed the following information:    - Patient presented with complaints of Chest Pain and Shortness of Breath.   - List of differential diagnosis include -  ACS, PE, pneumonia, COPD exacerbation, CHF exacerbation,          pneumothorax, MSK pain, chostocondritis, GERD   - Troponins negative, No acute EKG changes, VQ scan low probability for pulmonary embolus   - Treated for COPD Exacerbation with Duo Nebs, Prednisone, and Moxifloxacin    Based on your clinical judgment, please document the Known, Suspected, Possible or Probable cause of the patient's Chest Pain in the progress notes and discharge summary:   - Chest Pain 2/2 Known, Suspected, Possible, Probable .........................   - Chest Pain, unable to determine cause.   In responding to this query please exercise your independent judgment.    The fact that a query is asked, does not imply that any particular answer is desired or expected.   Reviewed: additional documentation in the medical record  Thank You,  Jerral Ralph  RN BSN CCDS Certified Clinical Documentation Specialist: Cell   [phone]  Health Information Management [employer]  TO RESPOND TO THE THIS QUERY, FOLLOW THE INSTRUCTIONS BELOW:  1. If needed, update documentation for the patient's encounter via the notes activity.  2. Access this query again and click edit on the In [employer].  3. After updating, or not, click F2 to complete all highlighted (required) fields concerning your review. Select "additional documentation in the medical record" OR "no additional documentation provided".  4. Click  Sign note button.  5. The deficiency will fall out of your In Basket *Please let us know if you are not able to complete this workflow by phone or e-mail (listed below).

## 2012-03-19 NOTE — Progress Notes (Signed)
ANTICOAGULATION CONSULT NOTE - follow up Pharmacy Consult:  Lovenox / Coumadin Indication:  History of atrial fibrillation and portal vein thrombosis  Allergies  Allergen Reactions  . Penicillins Rash    Patient Measurements: Height: 6\' 3"  (190.5 cm) Weight: 145 lb 8 oz (65.998 kg) (scale c) IBW/kg (Calculated) : 84.5    Vital Signs: Temp: 98.6 F (37 C) (10/31 0456) Temp src: Oral (10/31 0456) BP: 119/80 mmHg (10/31 0456) Pulse Rate: 81  (10/31 0456)  Labs:  Basename 03/19/12 0540 03/18/12 1100 03/18/12 0632 03/18/12 03/17/12 1700  HGB -- -- -- -- 18.3*  HCT -- -- -- -- 52.4*  PLT -- -- -- -- 209  APTT -- -- -- -- --  LABPROT 13.7 -- -- -- --  INR 1.06 -- -- -- --  HEPARINUNFRC -- -- -- -- --  CREATININE 1.75* -- 2.17* -- 2.12*  CKTOTAL -- -- -- -- --  CKMB -- -- -- -- --  TROPONINI -- <0.30 <0.30 <0.30 --    Estimated Creatinine Clearance: 37.7 ml/min (by C-G formula based on Cr of 1.75).   Assessment: 46 YOM with chest pain and SOB started on  full-dose Lovenox 10/30  and Coumadin  for history of atrial fibrillation and portal vein thrombosis.  Low probability for PE per VQ scan.  Patient was previously on Coumadin but has stopped since August.  He has CKD with borderline CrCL for Lovenox dosing.  Creatinine 1.75 with creat cl > ~ 38 ml/min. MD requested to change to once daily LMWH dosing on 10/31.  INR = 1.06 after first dose of coumadin 7.5 mg as expected.  He is on amiodarone which could increase his INR.   No bleeding reported.  He rec'd LMWH 65 mg this am already. He has used LMWH at home before and is comfortable with giving himself the shots. Plan is to DC home with 4 days of LMWH.  Goal of Therapy:  Anti-Xa level 0.6-1.2 units/ml 4hrs after LMWH dose given INR 2 - 3 Monitor platelets by anticoagulation protocol: Yes    Plan:  1. LMWH 30 mg sq now, then LMWH 100 mg sq q24 starting tomorrow morning 03/20/12 2. Coumadin 7.5 mg po daily - using 5 mg tablets  for versatilaty 3. Daily INR 4. Coumadin education completed 10/30. Herby Abraham, Pharm.D. [phone] 03/19/2012 9:17 AM

## 2012-03-19 NOTE — Progress Notes (Addendum)
Subjective:    Interval Events:  Breathing much improved.  Productive cough has turned into a wet, non-productive cough.  Denies recurrence of chest pain.  Appetite still lacking.  Patient thinks that if his medicines are all dosed in the mornings when he takes his dronederone and diltiazem, he will be able to remember to take them.  He has used enoxaparin before and is comfortable giving these to himself.    Objective:    Vital Signs:   Temp:  [97.6 F (36.4 C)-98.6 F (37 C)] 98.6 F (37 C) (10/31 0456) Pulse Rate:  [78-116] 81  (10/31 0456) Resp:  [16-20] 16  (10/31 0456) BP: (118-148)/(71-88) 119/80 mmHg (10/31 0456) SpO2:  [91 %-99 %] 91 % (10/31 0735) Weight:  [145 lb 8 oz (65.998 kg)] 145 lb 8 oz (65.998 kg) (10/31 0456) Last BM Date: 03/18/12   Weights: 24-hour Weight change: 6 lb 4.8 oz (2.858 kg)  Filed Weights   03/17/12 2251 03/18/12 0613 03/19/12 0456  Weight: 139 lb 3.2 oz (63.141 kg) 145 lb 3.2 oz (65.862 kg) 145 lb 8 oz (65.998 kg)   Net since admission:  +2.8kg   Intake/Output:   Intake/Output Summary (Last 24 hours) at 03/19/12 0759 Last data filed at 03/19/12 0700  Gross per 24 hour  Intake   1737 ml  Output   1485 ml  Net    252 ml     Net since admission:  +1.7L   Physical Exam: GENERAL:  alert and oriented; resting comfortably in bed and in no distress LUNGS:  No wheezing or rales, rhonchorus breath sounds or some pleural rubs auscultated; normal work of breathing HEART:  normal rate; regular rhythm; normal S1 and S2, no S3 or S4 appreciated; no murmurs, rubs, or clicks ABDOMEN:  soft, non-tender, normal bowel sounds, no masses palpated EXTREMITIES:  no edema SKIN:  normal turgor    Labs: Basic Metabolic Panel:  Lab 03/19/12 2952 03/18/12 0632 03/18/12 03/17/12 1700  NA 140 135 -- 139  K 4.7 5.2* -- 5.5*  CL 105 102 -- 101  CO2 25 19 -- 23  GLUCOSE 107* 123* -- 126*  BUN 35* 42* -- 41*  CREATININE 1.75* 2.17* -- 2.12*  CALCIUM  9.3 8.8 -- 10.4  MG -- -- 2.4 --  PHOS -- -- -- --   Cardiac Enzymes:  Lab 03/18/12 1100 03/18/12 0632 03/18/12  CKTOTAL -- -- --  CKMB -- -- --  CKMBINDEX -- -- --  TROPONINI <0.30 <0.30 <0.30   Coagulation Studies:  Basename 03/19/12 0540  LABPROT 13.7  INR 1.06    Other results: Imaging: Ct Chest Wo Contrast 03/18/2012  FINDINGS: Volume loss is again seen in the left chest with extensive pleural calcification. Mild degree of calcified pleural plaque formation is identified on the right.  The appearance is unchanged.  There is no pleural or pericardial effusion.  Heart size is normal.  No axillary, hilar mediastinal lymphadenopathy is identified.  Calcific aortic and coronary atherosclerotic vascular disease is seen.  Pacing device is noted.  Lungs demonstrate unchanged scarring on the left.  A nodular opacity in the right middle lobe measuring 0.5 cm is identified on image 36 and stable in appearance.  Emphysematous change is noted. There is some bronchiectatic change in the bases.  The lungs are otherwise unremarkable.  Incidentally imaged upper abdomen shows distention of the stomach.  No focal bony abnormality is identified.   IMPRESSION:  1.  No acute finding in  the chest. 2.  Volume loss and pulmonary scar in the left chest. 3.  Bilateral calcified pleural plaques compatible with asbestos exposure. 4.  Gaseous distention of the stomach. 5.  Calcific coronary and aortic atherosclerotic vascular disease.    Medications:    Infusions:    . sodium chloride 150 mL/hr (03/18/12 1100)     Scheduled Medications:    . buPROPion  75 mg Oral q morning - 10a  . diltiazem  240 mg Oral q morning - 10a  . dronedarone  400 mg Oral q morning - 10a  . enoxaparin (LOVENOX) injection  65 mg Subcutaneous Q12H  . enoxaparin   Does not apply Once  . feeding supplement  237 mL Oral BID BM  . influenza  inactive virus vaccine  0.5 mL Intramuscular Tomorrow-1000  . levofloxacin  750 mg  Oral Once  . levofloxacin  750 mg Oral Q48H  . pantoprazole  40 mg Oral Daily  . predniSONE  60 mg Oral Q breakfast  . sodium chloride  3 mL Intravenous Q12H  . tiotropium  18 mcg Inhalation Daily  . warfarin  7.5 mg Oral ONCE-1800  . Warfarin - Pharmacist Dosing Inpatient   Does not apply q1800  . DISCONTD: albuterol  2.5 mg Nebulization Q6H  . DISCONTD: heparin  5,000 Units Subcutaneous Q8H  . DISCONTD: ipratropium  0.5 mg Nebulization Q6H  . DISCONTD: levofloxacin  500 mg Oral q1800  . DISCONTD: moxifloxacin  400 mg Intravenous Q24H     PRN Medications: albuterol, nitroGLYCERIN, ondansetron (ZOFRAN) IV, ondansetron, oxyCODONE, polyethylene glycol, technetium albumin aggregated, DISCONTD: ipratropium, DISCONTD: technetium TC 68M diethylenetriame-pentaacetic acid    Assessment/ Plan:     1.   Chest pain:  ACS is unlikely given the normal EKG and negative troponins.  He has had a cough for the past couple of weeks that may represent a COPD exacerbation, but may have resulted in musculoskeletal injury of the thoracic wall; of note, palpation of the chest wall does not reproduce the pain, but coughing does exacerbate the pain.  PE is certainly a possibility.  According to the revised Geneva score, his pretest probability is ~28%.  The low probability V/Q (LR=0.25) gives a post-test probability of 9%, which does not sufficiently rule him out.  He needs long-term anti-coagulation for other reasons so we will not work VTE further.  He does not report a pleuritic pain, but he does have a history of asbestos exposure and left-sided pleural thickening and parenchymal scarring.  This chest pain may be pleurisy, or it could be lung cancer; his history of asbestos exposure and tobacco abuse for 40 years makes him high risk.  In fact, he qualifies for annual low-dose CT screening for lung cancer at this point.  Diagnostic chest CT here is stable from previous examinations.  Pneumonia is unlikely with a  negative CXR and GERD is possible but less likely given the history.  2.   Acute COPD exacerbation:  The history of increased dyspnea and productive cough meets 2 of the 3 cardinal signs of COPD exacerbations with complicating factor of age > 47.  I do not see PFTs on him, so unless his FEV1 is < 50%, he is not at risk for Pseudomonas.  We treated with scheduled albuterol and ipratropium, moxifloxacin->levofloxacin (day 3 today), and a prednisone taper (day 2 of 9).  At home, he takes tiotropium infrequently.  We have now stopped scheduled breathing treatments, transitioned ipratropium to tiotropium, and have him  on oral antibiotics and steroids; he is stable for discharge. - levofloxacin 750 mg PO every 48 hours for renal dosing (3/5); next dose Friday - prednisone 60mg  (2/3) - tiotropium - as needed albuterol  3.   Acute on chronic renal failure:  Baseline creatinine is ~1.3.  It was up to 2.12 at admission.  FENa, in the absence of diuretics, was 0.65%, suggesting pre-renal insult.  This is most likely hypovolemia from dehydration.  Acute heart failure can also present with a prerenal AKI, but he reports decreased PO intake over the past several days.  We hydrated with normal saline at 168mL/hr for 12 hours and he is now eating and drinking adequately.  Creatinine has responded and decreased to 1.75.  4.   Chronic systolic heart failure:  Echocardiography from 2011 showed an EF of 45% with global hypokinesis.  Although his proBNP was elevated to 2427 at admission, he appears euvolemic on exam.  This elevation in BNP is probably from renal failure rather than heart failure. - strict intake and output - daily weights  5.   Paroxysmal atrial fibrillation:  Long history of this but currently in sinus rhythm.  He is followed by cardiology for this and was previously anti-coagulated and rhythm controlled with diltiazem and dronedarone.  He has continued his antiarrhythmics but has failed to continue  taking warfarin.  We will continue his antiarrhythmics here and restart anti-coagluation therapy with enoxaparin bridging to warfarin with pharmacy's assistance.  INR is 1.06 today.  - diltiazem 240mg  daily - dronedarone 400mg  daily - enoxaparin 65mg  BID - warfarin 7.5mg  daily  6.   Hyperkalemia:  Potassium was 5.5 on admission.  Kayexalate was administered and his potassium decreased to 5.2 the morning following admission.  This is likely secondary to acute renal insufficiency. Potassium has decreased to 4.7 today.  7.   Weakness and weight loss:  He has lost 6.7kg (9.2%) in the past 12 months.  He reports a poor appetite during this period.  For cancer screening, he is up to date with a normal colonoscopy in 2012.  He received diagnostic chest CTs in 2011 and 2012, both showing a stable pulmonary nodule.  He does meet requirments for annual low dose chest CTs for lung cancer screening.  Repeat diagnostic chest CT here demonstrates stable findings.  SPEP and UPEP are pending; protein gap is 4.2.  8.   Severe malnutrition and underweight:  Body mass index is 18.15 kg/(m^2).  Feeding supplements provided.  An appetite stimulant might be useful, but we will defer to PCP as he has been addressing this problem for some time.  He says that ensure is expensive and he is inconsistently able to afford this.  RD states that all of the store brands are equivalent to brand name ensure.  9.   Depression:  Reportedly takes bupropion 75mg  daily.  Last mention of depression in a clinic note was July 2012 and mirtazipine was prescribed.  He confirms that he does take Wellbutrin daily for depression.  He thinks he remembers that mirtazapine had some unwanted side effects. - bupropion 75mg  daily  10. Prophylaxis: - enoxaparin for VTE prevention - pantoprazole 40mg  daily while anti-coagulated and taking prednisone  11. Disposition:  D/C today    Length of Stay: 2 days   Signed by:  Dorthula Rue. Earlene Plater,  MD PGY-I, Internal Medicine Pager [phone]  03/19/2012, 7:59 AM     Addendum:    Chest pain probably from musculoskeletal injury sustained from prolonged  cough.   Signed by:  Dorthula Rue. Earlene Plater, MD PGY-I, Internal Medicine Pager [phone]  03/19/2012, 1:02 PM

## 2012-03-19 NOTE — Telephone Encounter (Signed)
Pt is admission on 4th floor - needs help with getting Ensure. Suggest to talk with Lynnae January - social worker. Pt will call and leave message. Stanton Kidney Dauntae Derusha RN 03/19/12 3:30PM

## 2012-03-19 NOTE — Discharge Summary (Signed)
Patient Name:  Travis ASATO Sr. MRN: [id number]  PCP: [name], MD DOB:  1945/06/12       Date of Admission:  03/17/2012  Date of Discharge:  03/19/2012      Attending Physician: Ginnie Smart, MD         DISCHARGE DIAGNOSES: 1.   Chest pain:  likely MSK from cough 2.   Acute COPD exacerbation:   home with levofloxacin, prednisone, tiotropium 3.   Acute on chronic renal failure:  resolving, prerenal 4.   Chronic systolic heart failure:  stable 5.   Paroxysmal atrial fibrillation:  stable, restarted on warfarin, bridged with enoxaparin 6.   Hyperkalemia:  resolved 7.   Underweight & malnutrition:  chest CT unchanged, SPEP/UPEP ordered 8.   Depression:  stable   DISPOSITION AND FOLLOW-UP: Travis DORNER Sr. is to follow-up with the listed providers as detailed below, at which time, the following should be addressed:  1. Follow-up visits: 1. UNDERWEIGHT:  Consider appetite stimulant.  Follow up SPEP and UPEP. 2. ANTI-COAGULATION:  Was previously placed on long-term anticoagulation by cardiology because of paroxysmal atrial fibrillation.  Also has a history of portal vein thrombosis.  He was non-compliant previously because he could not remember to take warfarin.  He is able to remember to take antiarrhythmics, which he takes in the morning.  We have instructed him to take all medicines at the same time in the morning to improve compliance. 1.  enoxaparin 100mg  daily, last dose on Monday 11/4 2. PO warfarin 7.5mg  daily per pharmacy, INR 1.06 on morning of discharge  2. Labs and images needed:  NONE  3. Pending labs and tests needing follow-up: 1. SPEP 2. UPEP   Follow-up Information    Follow up with Gar Ponto, PHARMD. On 03/23/2012. (COUMADIN - Your appointment is at 3:00 on Monday, November 4.)    Contact information:   [address] [place] [address] [phone]      Follow up with Lollie Sails, MD. On 03/23/2012.  (INTERNAL MEDICINE - Your appointment is at 3:30 on Monday, November 4.)    Contact information:   [address] [place] [address] [phone]         Discharge Orders    Future Appointments: Provider: Department: Dept Phone: Center:   03/23/2012 3:00 PM [facility] [phone] [facility]   03/23/2012 3:30 PM Lollie Sails, MD [facility] [phone] [facility]   04/22/2012 11:15 AM [name], MD [facility] [phone] [facility]     Future Orders Please Complete By Expires   Diet general      Increase activity slowly      Call MD for:  temperature >100.4      Call MD for:  difficulty breathing, headache or visual disturbances      Discharge instructions      Comments:   You can use any store brand feeding supplement, not just Ensure, they are all equivalent.       DISCHARGE MEDICATIONS:   Medication List     As of 03/19/2012  2:43 PM    TAKE these medications         buPROPion 75 MG tablet   Commonly known as: WELLBUTRIN   Take 75 mg by mouth every morning.      diltiazem 240 MG 24 hr capsule   Commonly known as: CARDIZEM  CD   Take 240 mg by mouth every morning.      dronedarone 400 MG tablet   Commonly known as: MULTAQ   Take 400 mg by mouth every morning.      enoxaparin 100 MG/ML injection   Commonly known as: LOVENOX   Inject 1 mL (100 mg total) into the skin every morning.   Start taking on: 03/20/2012      levofloxacin 750 MG tablet   Commonly known as: LEVAQUIN   Take 1 tablet (750 mg total) by mouth every other day. Take on 11/1, 11/3, and 11/5.   Start taking on: 03/20/2012      multivitamin per tablet   Take 1 tablet by mouth daily.      omeprazole 20 MG capsule   Commonly known as: PRILOSEC   Take 2 capsules (40 mg total) by mouth daily.      oxyCODONE-acetaminophen 10-325 MG per tablet   Commonly known as: PERCOCET   Take 1 tablet by mouth every 6 (six) hours as  needed. For pain      predniSONE 20 MG tablet   Commonly known as: DELTASONE   Take by mouth with breakfast. Take 3 pills (60mg ) on 11/1, then 2 pills (40mg ) for 3 days, then 1 pill (20mg ) for 3 days.  Finish on 11/7.   Start taking on: 03/20/2012      tiotropium 18 MCG inhalation capsule   Commonly known as: SPIRIVA   Place 1 capsule (18 mcg total) into inhaler and inhale daily.   Start taking on: 03/20/2012      warfarin 5 MG tablet   Commonly known as: COUMADIN   Take 1.5 tablets (7.5 mg total) by mouth every morning.   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 Chronic elevation of right left diaphragmatic leaflet and blunting of the   left costophrenic angle.  Coarse linear parenchymal opacities in the mid and lower left lung are stable.  Right lung clear, hyperinflated. Heart size within normal limits for technique.  Regional bones unremarkable.  IMPRESSION:  1.  Stable left pleural and parenchymal changes.  No acute or superimposed abnormality.   Original Report Authenticated By: Thora Lance III, M.D.    Nm Pulmonary Perf And Vent 03/18/2012  FINDINGS: The ventilation scan demonstrates heterogeneous opacification of the lungs with central clumping of the radiopharmaceutical.  Findings most likely due to emphysematous changes.  No segmental defects.  The perfusion lung scan demonstrates heterogeneous activity with diffuse abnormality.  No discrete segmental or subsegmental perfusion defects.  A small volume left hemithorax is again demonstrated.   IMPRESSION: Low probability ventilation perfusion lung scan for pulmonary embolism. Diffuse abnormality likely due to emphysema.  Ct Chest Wo Contrast 03/18/2012  FINDINGS: Volume loss is again seen in the  left chest with extensive pleural calcification. Mild degree of calcified pleural plaque formation is identified on the right.  The appearance is unchanged.  There is no pleural or pericardial effusion.  Heart size is normal.  No axillary, hilar mediastinal lymphadenopathy is identified.  Calcific aortic and coronary atherosclerotic vascular disease is seen.  Pacing device is noted.  Lungs demonstrate unchanged scarring on the left.  A nodular opacity in the right middle  lobe measuring 0.5 cm is identified on image 36 and stable in appearance.  Emphysematous change is noted. There is some bronchiectatic change in the bases.  The lungs are otherwise unremarkable.  Incidentally imaged upper abdomen shows distention of the stomach.  No focal bony abnormality is identified.   IMPRESSION:  1.  No acute finding in the chest. 2.  Volume loss and pulmonary scar in the left chest. 3.  Bilateral calcified pleural plaques compatible with asbestos exposure. 4.  Gaseous distention of the stomach. 5.  Calcific coronary and aortic atherosclerotic vascular disease.   ADMISSION DATA: H&P: Mr. Jernigan is a 66 yo gentleman with a history of Afib with RVR s/p pacer, CHF (last EF was 45%, mild biatrial enlargement, pulm htn), CKD, COPD, who presents with shortness of breath, chest pain, and weakness of 1 week duration. He states that the chest pain is "achy," lasts for 4-5 minutes at a time, comes on at random times, mostly at rest. Location is the center of his chest radiating to the left side. Associated symptoms include sweating, dizziness, shortness of breath. Pain is 8/10, worse with movement, unchanged with deep inspiration. He endorses having fever, chills, increased cough with white sputum production. He has nausea and vomited twice in the ED. Has not eating a full meal in 3-4 days. States he has lost about 25-30 pounds over the past 6 months, chronic decreased appetite. Occasional LE edema, not worse than usual. Sleep with  2 pillows at night, no change recently. He does have baseline shortness of breath with exertion, but feels this is worse than usual. He cannot walk to get the mail without being short of breath and has to stop.  Over the last week, he has also had aches "all over" with global weakness. He states that he feels unsteady on his feet, worse in the last week.  He smokes 1 pack per 2-3 days, used to be on home O2. Lives with roommate, retired, occasionally drives a taxi part time.  Physical Exam: Vitals: Blood pressure 154/98, pulse 76, temperature 98.6 F (37 C), temperature source Oral, resp. rate 20, height 6\' 3"  (1.905 m), weight 139 lb 3.2 oz (63.141 kg), SpO2 97.00%.  General: Thin, sitting in bed, alert and oriented x 3  HEENT: PERRL, EOMI, no lymphadenopathy, dry mucous membranes  Cardiovascular: Regular rate and rhythm, no murmurs  Respiratory: Decrease BS throughout, some course breath sounds, worse with expiration L>R, no rales, no wheezes  Abdomen: Slightly firm, nondistended, nontender, +bowel sounds  Extremities: Warm and well-perfused, 2+ pedal pulses, no edema. No calf tenderness.  Skin: Warm, dry, no rashes  Neuro: flat affect, strength 5/5 upper extremity, 4/5 lower.  Labs: Basic Metabolic Panel:  Basename  03/17/12 1700   NA  139   K  5.5*   CL  101   CO2  23   GLUCOSE  126*   BUN  41*   CREATININE  2.12*   CALCIUM  10.4   MG  --   PHOS  --    Liver Function Tests:  Basename  03/17/12 1700   AST  17   ALT  11   ALKPHOS  97   BILITOT  0.5   PROT  8.4*   ALBUMIN  4.2    CBC:  Basename  03/17/12 1700   WBC  11.1*   NEUTROABS  8.0*   HGB  18.3*   HCT  52.4*   MCV  91.4   PLT  209  BNP:  Basename  03/17/12 1645   PROBNP  2427.0*    Urine Drug Screen:    Component  Value  Date/Time    LABOPIA  POSITIVE*  02/06/2010 0248    LABOPIA  NEGATIVE  12/27/2008 1943    COCAINSCRNUR  NONE DETECTED  02/06/2010 0248    COCAINSCRNUR  NEG  07/06/2009 2225    LABBENZ   NONE DETECTED  02/06/2010 0248    LABBENZ  NEG  07/06/2009 2225    AMPHETMU  NONE DETECTED  02/06/2010 0248    AMPHETMU  NEG  07/06/2009 2225    THCU  POSITIVE*  02/06/2010 0248    LABBARB  Value: NONE DETECTED DRUG SCREEN FOR MEDICAL PURPOSES ONLY. IF CONFIRMATION IS NEEDED FOR ANY PURPOSE, NOTIFY LAB WITHIN 5 DAYS. LOWEST DETECTABLE LIMITS FOR URINE DRUG SCREEN Drug Class Cutoff (ng/mL) Amphetamine 1000 Barbiturate 200 Benzodiazepine 200 Tricyclics 300 Opiates 300 Cocaine 300 THC 50  02/06/2010     HOSPITAL COURSE: 1.   Chest pain, probably MSK:  ACS is unlikely given the normal EKG and negative troponins. He has had a cough for the past couple of weeks that likely represents a COPD exacerbation, but may have resulted in musculoskeletal injury of the thoracic wall; of note, palpation of the chest wall does not reproduce the pain, but coughing does exacerbate the pain. PE is certainly a possibility. According to the revised Geneva score, his pretest probability is ~28%. The low probability V/Q (LR=0.25) gives a post-test probability of 9%, which does not sufficiently rule him out. He needs long-term anti-coagulation for other reasons so we will not work VTE up further. He does not report a pleuritic pain, but he does have a history of asbestos exposure and left-sided pleural thickening and parenchymal scarring. This chest pain may be pleurisy, or it could be lung cancer; his history of asbestos exposure and tobacco abuse for 40 years makes him high risk. In fact, he qualifies for annual low-dose CT screening for lung cancer at this point. Diagnostic chest CT here is stable from previous examinations. Pneumonia is unlikely with a negative CXR and GERD is possible but less likely given the history.   2.   Acute COPD exacerbation:  The history of increased dyspnea and productive cough meets 2 of the 3 cardinal signs of COPD exacerbations with complicating factor of age > 42. I did not see PFTs on him, so unless  his FEV1 is < 50%, he is not at risk for Pseudomonas. We treated with scheduled albuterol and ipratropium, moxifloxacin->levofloxacin (day 3 today), and a prednisone taper (day 2 of 9). At home, he takes tiotropium infrequently. We have now stopped scheduled breathing treatments, transitioned ipratropium to tiotropium, and have him on oral antibiotics and steroids.  3.   Acute on chronic renal failure:  Baseline creatinine is ~1.3. It was up to 2.12 at admission. FENa, in the absence of diuretics, was 0.65%, suggesting pre-renal insult. This is most likely hypovolemia from dehydration. Acute heart failure can also present with a prerenal AKI, but he reports decreased PO intake over the past several days. We hydrated with normal saline at 191mL/hr for 12 hours and he is now eating and drinking adequately. Creatinine has responded and decreased to 1.75.  4.   Chronic systolic heart failure:  Echocardiography from 2011 showed an EF of 45% with global hypokinesis. Although his proBNP was elevated to 2427 at admission, he appears euvolemic on exam. This elevation in BNP is probably from renal  failure rather than heart failure.   5.    Paroxysmal atrial fibrillation:  Long history of this but currently in sinus rhythm. He is followed by cardiology for this and was previously anti-coagulated and rhythm controlled with diltiazem and dronedarone. He has continued his antiarrhythmics but has failed to continue taking warfarin. We will continue his antiarrhythmics here and restart anti-coagluation therapy with enoxaparin bridging to warfarin with pharmacy's assistance. INR is 1.06 at discharge.   6.   Hyperkalemia:  Potassium was 5.5 on admission. Kayexalate was administered and his potassium decreased to 5.2 the morning following admission. This is likely secondary to acute renal insufficiency. Potassium has decreased further to 4.7 at discharge.   7.   Weakness and weight loss:  He has lost 6.7kg (9.2%) in the past  12 months. He reports a poor appetite during this period. For cancer screening, he is up to date with a normal colonoscopy in 2012. He received diagnostic chest CTs in 2011 and 2012, both showing a stable pulmonary nodule. He does meet requirments for annual low dose chest CTs for lung cancer screening. Repeat diagnostic chest CT here demonstrates stable findings. SPEP and UPEP are pending; protein gap is 4.2.   8.   Severe malnutrition and underweight:  Body mass index is 18.15 kg/(m^2). Feeding supplements provided. An appetite stimulant might be useful, but we will defer to PCP as he has been addressing this problem for some time. He says that ensure is expensive and he is inconsistently able to afford this. RD states that all of the store brands are equivalent to brand name ensure.   9.   Depression:  Reportedly takes bupropion 75mg  daily. Last mention of depression in a clinic note was July 2012 and mirtazipine was prescribed. He confirms that he does take Wellbutrin daily for depression. He thinks he remembers that mirtazapine had some unwanted side effects.     DISCHARGE DATA: Vital Signs: BP 102/88  Pulse 86  Temp 98.4 F (36.9 C) (Oral)  Resp 20  Ht 6\' 3"  (1.905 m)  Wt 145 lb 8 oz (65.998 kg)  BMI 18.19 kg/m2  SpO2 100%  Labs: Results for orders placed during the hospital encounter of 03/17/12 (from the past 24 hour(s))  PROTIME-INR     Status: Normal   Collection Time   03/19/12  5:40 AM      Component Value Range   Prothrombin Time 13.7  11.6 - 15.2 seconds   INR 1.06  0.00 - 1.49  BASIC METABOLIC PANEL     Status: Abnormal   Collection Time   03/19/12  5:40 AM      Component Value Range   Sodium 140  135 - 145 mEq/L   Potassium 4.7  3.5 - 5.1 mEq/L   Chloride 105  96 - 112 mEq/L   CO2 25  19 - 32 mEq/L   Glucose, Bld 107 (*) 70 - 99 mg/dL   BUN 35 (*) 6 - 23 mg/dL   Creatinine, Ser 8.11 (*) 0.50 - 1.35 mg/dL   Calcium 9.3  8.4 - 91.4 mg/dL   GFR calc non Af Amer 38  (*) >90 mL/min   GFR calc Af Amer 44 (*) >90 mL/min     Time spent on discharge: 58 minutes  Services Ordered on Discharge: 1. PT - outpatient 2. OT - no 3. RN - no 4. Other - no   Signed by:  Dorthula Rue. Earlene Plater, MD PGY-I, Internal Medicine  03/19/2012, 2:43 PM

## 2012-03-23 ENCOUNTER — Ambulatory Visit (INDEPENDENT_AMBULATORY_CARE_PROVIDER_SITE_OTHER): Payer: Medicare Other | Admitting: Internal Medicine

## 2012-03-23 ENCOUNTER — Ambulatory Visit (INDEPENDENT_AMBULATORY_CARE_PROVIDER_SITE_OTHER): Payer: Medicare Other | Admitting: Pharmacist

## 2012-03-23 ENCOUNTER — Encounter: Payer: Self-pay | Admitting: Internal Medicine

## 2012-03-23 ENCOUNTER — Telehealth: Payer: Self-pay | Admitting: Licensed Clinical Social Worker

## 2012-03-23 VITALS — BP 108/61 | HR 69 | Temp 98.5°F | Ht 75.0 in | Wt 160.1 lb

## 2012-03-23 DIAGNOSIS — Z7901 Long term (current) use of anticoagulants: Secondary | ICD-10-CM

## 2012-03-23 DIAGNOSIS — F32A Depression, unspecified: Secondary | ICD-10-CM

## 2012-03-23 DIAGNOSIS — F329 Major depressive disorder, single episode, unspecified: Secondary | ICD-10-CM

## 2012-03-23 DIAGNOSIS — R636 Underweight: Secondary | ICD-10-CM

## 2012-03-23 DIAGNOSIS — J441 Chronic obstructive pulmonary disease with (acute) exacerbation: Secondary | ICD-10-CM

## 2012-03-23 DIAGNOSIS — I81 Portal vein thrombosis: Secondary | ICD-10-CM

## 2012-03-23 DIAGNOSIS — I4891 Unspecified atrial fibrillation: Secondary | ICD-10-CM

## 2012-03-23 LAB — UIFE/LIGHT CHAINS/TP QN, 24-HR UR
Albumin, U: DETECTED
Alpha 1, Urine: DETECTED — AB
Beta, Urine: DETECTED — AB
Free Kappa Lt Chains,Ur: 4.69 mg/dL — ABNORMAL HIGH (ref 0.14–2.42)
Gamma Globulin, Urine: DETECTED — AB

## 2012-03-23 LAB — PROTEIN ELECTROPHORESIS, SERUM
Beta Globulin: 6 % (ref 4.7–7.2)
Gamma Globulin: 13 % (ref 11.1–18.8)
M-Spike, %: NOT DETECTED g/dL
Total Protein ELP: 6.1 g/dL (ref 6.0–8.3)

## 2012-03-23 MED ORDER — FLUOXETINE HCL 20 MG PO CAPS: 20.0000 mg | ORAL_CAPSULE | Freq: Every day | ORAL | Status: DC

## 2012-03-23 NOTE — Patient Instructions (Signed)
Patient instructed to take medications as defined in the Anti-coagulation Track section of this encounter.  Patient instructed to take today's dose.  Patient verbalized understanding of these instructions.    

## 2012-03-23 NOTE — Assessment & Plan Note (Addendum)
He is followed by Hillis Range for this who has the patient on dronederone and diltiazem.  In his last note in April, Dr. Johney Frame specifically mentions long-term anticoagulation.  The patient remained in sinus rhythm throughout his recent hospital stay, but seems to be in atrial fibrillation at his visit today, underlining the importance of long-term anticoagulation.  The patient met with Dr. Alexandria Lodge today and was sub-therapeutic (INR 1.0), so he has been instructed to take 12.5mg  for the next week and follow up with Dr. Alexandria Lodge next Monday.  In the meantime, the patient is exploring factor Xa inhibitors and their costs. - continue dronedarone 400mg  daily - continue diltiazem 240mg  daily - take warfarin 12.5mg  daily and follow up with Dr. Alexandria Lodge in 1 week

## 2012-03-23 NOTE — Patient Instructions (Addendum)
Stop taking Wellbutrin.  Begin taking fluoxetine (Prozac) 20mg  each morning.  Make an appointment with [facility].  Follow up with Dr. Augusto Garbe on December 4th.

## 2012-03-23 NOTE — Assessment & Plan Note (Signed)
Body mass index is 18.19 at the last hospital admission.  Today's BMI of 20 is falsely elevated by heavy clothing.  A number of malignancies were effectively ruled out during this recent hospitalization, including multiple myeloma and lung cancer.  He has previously had a normal colonoscopy in 2012 and a PSA in 2011 (about when the weight loss began) was normal.  Depression and medication side effects are potential etiologies.  His depression is inadequately controlled currently and bupropion has a know side effect of weight loss.  For this reason, we will switch him from bupropion back to fluoxetine, titrate fluoxetine up to treat depressive symptoms, and refer him to [facility] for counseling and support.  In the meantime, he has been using Ensure supplementation, but he has typically being taking the Ensure shake before meals, which tends to suppress appetite at the meal itself.  During the hospitalization, I instructed him to instead eat his usual meals as he normally would, and once the meal is over, then drink his Ensure.  He complained that Ensure was expensive, but I told him to look for nearby store brands supplements that were equivalent to Ensure.  He says they are much more affordable. - continue using feeding supplement shakes AFTER meals - treat depression with fluoxetine rather than bupropion

## 2012-03-23 NOTE — Progress Notes (Signed)
  Subjective:    Patient ID: Travis Benes., male    DOB: 25-Aug-1945, 66 y.o.   MRN: [id number]  HPI:  This is a 66 year old man with COPD, paroxysmal atrial fibrillation, depression and unintentional weight loss; presenting for hospital follow up.  He was admitted on 10/29 for chest pain and was  discharged on 10/31 diagnosed with an acute COPD exacerbation.    COPD Since discharge, he has been feeling better each day:  his cough and breathing, though not yet back to baseline, are improving and he produces a clear phlegm with cough now.  He is on schedule with the levofloxacin and prednisone prescribed at discharge:  tomorrow is the last day of levofloxacin and he has 3 more days of prednisone 20mg  before finishing that taper.  He has restarted using tiotropium daily whereas previously he was using this sporadically.  Weight Loss & Depression Since the end of 2010, he has gradually lost weight totaling about 55 pounds or 25%.  His last PSA was 0.36 in November 2011, he had a normal colonoscopy in 2012, a chest CT during this hospitalization demonstrated stable changes, and a SPEP and UPEP from this hospitalization did not suggest multiple myeloma.  He does have depression and has battled this for some time, switching between fluoxetine, citalopram, mirtazapine and bupropion.  He reports a depressed appetite and a mood that is intermittently depressed; some days he feels in good spirits, other days he feels down and has no energy.   Review of Systems  Respiratory: Positive for cough (improving) and shortness of breath (improving).   Cardiovascular: Negative for chest pain.  Psychiatric/Behavioral: Positive for dysphoric mood. Negative for suicidal ideas.  All other systems reviewed and are negative.       Objective:   Physical Exam GENERAL: cachetic; no acute distress LUNGS: bronchial breath sounds throughout, normal work of breathing HEART: normal rate but an irregularly irregular rhythm;  normal S1 and S2 without S3 or S4; no murmurs, rubs, or clicks ABDOMEN: soft, non-tender, normal bowel sounds  Filed Vitals:   03/23/12 1530  BP: 108/61  Pulse: 69  Temp: 98.5 F (36.9 C)    SPEP:  Ref. Range 03/19/2012 05:40  Total Protein ELP Latest Range: 6.0-8.3 g/dL 6.1  Albumin ELP Latest Range: 55.8-66.1 % 54.6 (L)  Alpha-1-Globulin Latest Range: 2.9-4.9 % 4.9  Alpha-2-Globulin Latest Range: 7.1-11.8 % 16.7 (H)  Beta Globulin Latest Range: 4.7-7.2 % 6.0  Beta 2 Latest Range: 3.2-6.5 % 4.8  Gamma Globulin Latest Range: 11.1-18.8 % 13.0  M-SPIKE, % No range found NOT DETECTED   UPEP:  Ref. Range 03/18/2012 23:42  Total Protein, Urine-UPE24 No range found 5.6  ALBUMIN, U Latest Range: DETECTED  DETECTED  Alpha 1, Urine Latest Range: NONE DETECTED  DETECTED (A)  Alpha 2, Urine Latest Range: NONE DETECTED  DETECTED (A)  Beta, Urine Latest Range: NONE DETECTED  DETECTED (A)  Gamma Globulin, Urine Latest Range: NONE DETECTED  DETECTED (A)  Free Kappa Lt Chains,Ur Latest Range: 0.14-2.42 mg/dL 4.09 (H)  Free Lambda Lt Chains,Ur Latest Range: 0.02-0.67 mg/dL 8.11  Free Kappa/Lambda Ratio Latest Range: 2.04-10.37 ratio 10.91 (H)       Assessment and Plan:

## 2012-03-23 NOTE — Assessment & Plan Note (Signed)
It seems depression is only partially controlled with bupropion 75mg  daily.  This depression may be the cause of his weight loss and poor appetite.  Since weight loss is a documented side effect of bupropion, I think it best to use a different medication to try to control the depression.  He has tolerated fluoxetine in the past; this was switched to mirtazapine in hopes of stimulating his appetite, but at the time, he was only on 20mg  of fluoxetine and perhaps his derepression was simply not completely treated.  We will switch him back to fluoxetine, beginning at 20mg  daily with plans to titrate up as needed.  We have also referred him to [facility] for counseling and support to augment this pharmacotherapy. - stop bupropion - start fluoxetine 20mg  daily - follow up with [facility] - titrate fluoxetine up at next visit if depressive symptoms persist

## 2012-03-23 NOTE — Progress Notes (Signed)
Anti-Coagulation Progress Note  [name]. is a 66 y.o. male who is currently on an anti-coagulation regimen.    RECENT RESULTS: Recent results are below, the most recent result is correlated with a dose of 87.5 mg. per week: Lab Results  Component Value Date   INR 1.0 03/23/2012   INR 1.06 03/19/2012   INR 1.30 10/21/2011    ANTI-COAG DOSE:   Latest dosing instructions   Total Sun Mon Tue Wed Thu Fri Sat   87.5 12.5 mg 12.5 mg 12.5 mg 12.5 mg 12.5 mg 12.5 mg 12.5 mg    (5 mg2.5) (5 mg2.5) (5 mg2.5) (5 mg2.5) (5 mg2.5) (5 mg2.5) (5 mg2.5)         ANTICOAG SUMMARY: Anticoagulation Episode Summary              Current INR goal  Next INR check 03/30/2012   INR from last check 1.0 (03/23/2012)     Weekly max dose (mg)  Target end date    Indications Long-term (current) use of anticoagulants [V58.61], Atrila fibrillation (Resolved) [427.31], Thrombosis, 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]   INR check location  Preferred lab    Send INR reminders to ANTICOAG [facility]   Comments        Provider Role Specialty Phone number   [name], MD  Internal Medicine [phone]        ANTICOAG TODAY: Anticoagulation Summary as of 03/23/2012              INR goal      Selected INR 1.0 (03/23/2012) Next INR check 03/30/2012   Weekly max dose (mg)  Target end date    Indications Long-term (current) use of anticoagulants [V58.61], Atrila fibrillation (Resolved) [427.31], Thrombosis, 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  Patient instructed to take today's dose.  Patient verbalized understanding of these instructions.         FOLLOW-UP Return in 7 days (on 03/30/2012) for Follow up INR at 4:15PM.  [name], III Pharm.D., CACP

## 2012-03-23 NOTE — Telephone Encounter (Signed)
CSW placed called to pt.  CSW left message requesting return call. CSW provided contact hours and phone number. 

## 2012-03-23 NOTE — Assessment & Plan Note (Signed)
Improving.  He will finish the levofloxacin tomorrow and the prednisone taper on the 7th.  I have instructed him to take tiotropium daily.  I do not see PFTs for him, but these would be helpful to better understand what stage COPD he has.  Daily long-acting bronchodilator is standard treatment for stage II COPD, but it might be good to provide him with a short acting bronchodilator such as albuterol for as needed use. - finish levofloxacin and prednisone courses - continue daily tiotropium - consider PFTs and albuterol prescription

## 2012-03-24 NOTE — Progress Notes (Signed)
I discussed the plan with Dr. Earlene Plater and I agree with it. Thanks [employer]

## 2012-03-30 ENCOUNTER — Ambulatory Visit: Payer: Medicare Other

## 2012-04-06 ENCOUNTER — Ambulatory Visit (INDEPENDENT_AMBULATORY_CARE_PROVIDER_SITE_OTHER): Payer: Medicare Other | Admitting: Pharmacist

## 2012-04-06 ENCOUNTER — Other Ambulatory Visit: Payer: Self-pay | Admitting: *Deleted

## 2012-04-06 DIAGNOSIS — I81 Portal vein thrombosis: Secondary | ICD-10-CM

## 2012-04-06 DIAGNOSIS — Z7901 Long term (current) use of anticoagulants: Secondary | ICD-10-CM

## 2012-04-06 NOTE — Telephone Encounter (Signed)
Review.

## 2012-04-06 NOTE — Progress Notes (Signed)
Anti-Coagulation Progress Note  [name]. is a 66 y.o. male who is currently on an anti-coagulation regimen.    RECENT RESULTS: Recent results are below, the most recent result is correlated with a dose of 87.5 mg. per week: Lab Results  Component Value Date   INR 7.0 04/06/2012   INR 1.0 03/23/2012   INR 1.06 03/19/2012    ANTI-COAG DOSE:   Latest dosing instructions   Total Sun Mon Tue Wed Thu Fri Sat   70 10 mg 10 mg 10 mg 10 mg 10 mg 10 mg 10 mg    (5 mg2) (5 mg2) (5 mg2) (5 mg2) (5 mg2) (5 mg2) (5 mg2)         ANTICOAG SUMMARY: Anticoagulation Episode Summary              Current INR goal  Next INR check 04/13/2012   INR from last check 7.0 (04/06/2012)     Weekly max dose (mg)  Target end date    Indications Long-term (current) use of anticoagulants [V58.61], Atrila fibrillation (Resolved) [427.31], Thrombosis, 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]   INR check location  Preferred lab    Send INR reminders to ANTICOAG [facility]   Comments        Provider Role Specialty Phone number   [name], MD  Internal Medicine [phone]        ANTICOAG TODAY: Anticoagulation Summary as of 04/06/2012              INR goal      Selected INR 7.0 (04/06/2012) Next INR check 04/13/2012   Weekly max dose (mg)  Target end date    Indications Long-term (current) use of anticoagulants [V58.61], Atrila fibrillation (Resolved) [427.31], Thrombosis, 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  Patient instructed to OMIT/HOLD today's dose.  Patient verbalized understanding of these instructions.         FOLLOW-UP Return in 7 days (on 04/13/2012) for Follow up INR at 4PM.  [name], III Pharm.D., CACP

## 2012-04-06 NOTE — Patient Instructions (Signed)
Patient instructed to take medications as defined in the Anti-coagulation Track section of this encounter.  Patient instructed to OMIT/HOLD today's dose.  Patient verbalized understanding of these instructions.    

## 2012-04-13 ENCOUNTER — Ambulatory Visit: Payer: Medicare Other

## 2012-04-20 ENCOUNTER — Ambulatory Visit (INDEPENDENT_AMBULATORY_CARE_PROVIDER_SITE_OTHER): Payer: Medicare Other | Admitting: Pharmacist

## 2012-04-20 DIAGNOSIS — I81 Portal vein thrombosis: Secondary | ICD-10-CM

## 2012-04-20 DIAGNOSIS — Z7901 Long term (current) use of anticoagulants: Secondary | ICD-10-CM

## 2012-04-20 LAB — POCT INR: INR: 3.3

## 2012-04-20 NOTE — Progress Notes (Signed)
Anti-Coagulation Progress Note  [name]. is a 66 y.o. male who is currently on an anti-coagulation regimen.    RECENT RESULTS: Recent results are below, the most recent result is correlated with a dose of 70 mg. per week: Lab Results  Component Value Date   INR 3.30 04/20/2012   INR 7.0 04/06/2012   INR 1.0 03/23/2012    ANTI-COAG DOSE:   Latest dosing instructions   Total Sun Mon Tue Wed Thu Fri Sat   67.5 10 mg 7.5 mg 10 mg 10 mg 10 mg 10 mg 10 mg    (5 mg2) (5 mg1.5) (5 mg2) (5 mg2) (5 mg2) (5 mg2) (5 mg2)         ANTICOAG SUMMARY: Anticoagulation Episode Summary              Current INR goal  Next INR check 05/04/2012   INR from last check 3.30 (04/20/2012)     Weekly max dose (mg)  Target end date    Indications Long-term (current) use of anticoagulants [V58.61], Atrila fibrillation (Resolved) [427.31], Thrombosis, 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]   INR check location  Preferred lab    Send INR reminders to ANT[facility]AG [facility]   Comments        Provider Role Specialty Phone number   [name], MD  Internal Medicine [phone]        ANTICOAG TODAY: Anticoagulation Summary as of 04/20/2012              INR goal      Selected INR 3.30 (04/20/2012) Next INR check 05/04/2012   Weekly max dose (mg)  Target end date    Indications Long-term (current) use of anticoagulants [V58.61], Atrila fibrillation (Resolved) [427.31], Thrombosis, 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  Patient instructed to take today's dose.  Patient verbalized understanding of these instructions.         FOLLOW-UP Return in 2 weeks (on 05/04/2012) for Follow up INR at 4:45PM.  [name], III Pharm.D., CACP

## 2012-04-20 NOTE — Patient Instructions (Signed)
Patient instructed to take medications as defined in the Anti-coagulation Track section of this encounter.  Patient instructed to take today's dose.  Patient verbalized understanding of these instructions.    

## 2012-04-21 NOTE — Progress Notes (Signed)
Agree with Dr. Groce's plan. 

## 2012-04-22 ENCOUNTER — Ambulatory Visit (INDEPENDENT_AMBULATORY_CARE_PROVIDER_SITE_OTHER): Payer: Medicare Other | Admitting: Internal Medicine

## 2012-04-22 ENCOUNTER — Encounter: Payer: Self-pay | Admitting: Internal Medicine

## 2012-04-22 VITALS — BP 120/80 | HR 81 | Temp 97.6°F | Ht 75.0 in | Wt 150.3 lb

## 2012-04-22 DIAGNOSIS — F17200 Nicotine dependence, unspecified, uncomplicated: Secondary | ICD-10-CM

## 2012-04-22 DIAGNOSIS — I4891 Unspecified atrial fibrillation: Secondary | ICD-10-CM

## 2012-04-22 DIAGNOSIS — F329 Major depressive disorder, single episode, unspecified: Secondary | ICD-10-CM

## 2012-04-22 DIAGNOSIS — I10 Essential (primary) hypertension: Secondary | ICD-10-CM

## 2012-04-22 DIAGNOSIS — J441 Chronic obstructive pulmonary disease with (acute) exacerbation: Secondary | ICD-10-CM

## 2012-04-22 DIAGNOSIS — R634 Abnormal weight loss: Secondary | ICD-10-CM

## 2012-04-22 MED ORDER — ALBUTEROL SULFATE HFA 108 (90 BASE) MCG/ACT IN AERS: 2.0000 | INHALATION_SPRAY | Freq: Four times a day (QID) | RESPIRATORY_TRACT | Status: DC | PRN

## 2012-04-22 NOTE — Progress Notes (Signed)
  Subjective:    Patient ID: Travis Benes., male    DOB: 13-Jan-1946, 66 y.o.   MRN: [id number]  HPI Patient returns for followup of his weight loss, hypertension, COPD, atrial fibrillation, and other chronic medical problems.  He reports no improvement in his appetite since his last visit here.  He reports ongoing problems with hip pain; he has not followed up with his orthopedic surgeon recently; the pain is reasonably well-controlled on his current regimen of oxycodone/acetaminophen.  He reports some increase in shortness of breath at night; he is currently out of his albuterol inhaler, but reports that he has been using Spiriva as prescribed.  He reports some improvement in his depression on fluoxetine; he has not yet made an appointment at [facility].  He did not bring his medications to clinic today, but confirmed his list from memory.   Review of Systems  Constitutional: Negative for fever, chills and diaphoresis.  Respiratory: Negative for cough and shortness of breath.   Cardiovascular: Negative for chest pain and leg swelling.  Gastrointestinal: Negative for nausea, vomiting, abdominal pain and blood in stool.       Objective:   Physical Exam  Constitutional: No distress.  Cardiovascular: Normal rate and regular rhythm.  Exam reveals no gallop and no friction rub.   No murmur heard. Pulmonary/Chest: Effort normal and breath sounds normal. He has no wheezes. He has no rales.  Abdominal: Soft. Bowel sounds are normal. There is no tenderness.  Musculoskeletal: He exhibits no edema.    I reviewed the past medical, family, and social history.      Assessment & Plan:

## 2012-04-22 NOTE — Assessment & Plan Note (Signed)
Pulse Readings from Last 3 Encounters:  04/22/12 81  03/23/12 69  03/19/12 86    Assessment: Patient clinically appears to be in normal sinus rhythm with a normal rate on his current dose of dronedarone (Multaq) 400 mg daily and diltiazem CD (Cardizem CD) 240 mg daily.  Plan: Continue current medications; patient will followup with his cardiologist routinely as scheduled.  He is currently on warfarin for stroke prophylaxis, and will followup as recommended in the anticoagulation clinic

## 2012-04-22 NOTE — Assessment & Plan Note (Addendum)
  Assessment:  Progress toward smoking cessation:   smoking less  Barriers to progress toward smoking cessation: lack of motivation to quit  Comments: patient acknowledges that he needs to stop smoking, and reports that he has cut back.  Plan:  Instruction/counseling given:  I counseled patient on the dangers of tobacco use, advised patient to stop smoking and reviewed strategies to maximize success.  Educational resources provided:  smoking cessation handout (tips, strategies, fact sheets)  Other: patient reports that he has cut back, and will try stopping completely; he will consider medication to assist with smoking cessation and we will discuss at the next visit.

## 2012-04-22 NOTE — Assessment & Plan Note (Addendum)
Wt Readings from Last 3 Encounters:  04/22/12 150 lb 4.8 oz (68.176 kg)  03/23/12 160 lb 1.6 oz (72.621 kg)  03/19/12 145 lb 8 oz (65.998 kg)    Assessment: Patient's weight is down from his last visit, but near his overall chronic baseline.  Workup has shown no evident underlying problem to explain his weight loss.  He has had some trouble affording nutritional supplements, and asked if help is available affording them.  Plan: Continue nutritional supplements; refer to social work to see if there any resources available to assist with the cost of these; follow weights; check labs to include a comprehensive metabolic panel, CBC with differential, TSH, and free T4.

## 2012-04-22 NOTE — Assessment & Plan Note (Signed)
Assessment: Patient has had some increase in dyspnea, especially at night since he has been out of his albuterol inhaler.  He reports that he has been using Spiriva as prescribed.  Plan: Refill albuterol; I advised patient to let me know if his symptoms do not improve.

## 2012-04-22 NOTE — Patient Instructions (Addendum)
General Instructions: Start albuterol inhaler 2 puffs every 6 hours as needed for wheezing or shortness of breath. Please talk with the clinic social worker to see if help is available for nutritional supplements.   Treatment Goals:  Goals (1 Years of Data) as of 04/22/2012          As of Today 03/23/12 03/19/12 03/19/12 03/19/12     Blood Pressure    . Blood Pressure < 140/90  120/80 108/61 102/88 104/67 119/80      Progress Toward Treatment Goals:  Treatment Goal 04/22/2012  Blood pressure at goal  Stop smoking smoking less    Self Care Goals & Plans:  Self Care Goal 04/22/2012  Manage my medications take my medicines as prescribed; bring my medications to every visit  Monitor my health keep track of my weight       Care Management & Community Referrals:  Referral 04/22/2012  Referrals made for care management support none needed; social worker

## 2012-04-22 NOTE — Assessment & Plan Note (Addendum)
BP Readings from Last 3 Encounters:  04/22/12 120/80  03/23/12 108/61  03/19/12 102/88    Lab Results  Component Value Date   NA 140 03/19/2012   K 4.7 03/19/2012   CREATININE 1.75* 03/19/2012    Assessment:  Blood pressure control: controlled  Progress toward BP goal:  at goal  Comments: currently on Cardizem CD 240 mg daily  Plan:  Medications:  continue current medications  Educational resources provided: brochure  Self management tools provided: home blood pressure logbook  Other plans: check metabolic panel as above.

## 2012-04-23 ENCOUNTER — Telehealth: Payer: Self-pay | Admitting: Licensed Clinical Social Worker

## 2012-04-23 LAB — CBC WITH DIFFERENTIAL/PLATELET
Basophils Relative: 0 % (ref 0–1)
Eosinophils Absolute: 0.2 10*3/uL (ref 0.0–0.7)
MCH: 31.3 pg (ref 26.0–34.0)
MCHC: 34.1 g/dL (ref 30.0–36.0)
Neutrophils Relative %: 61 % (ref 43–77)
Platelets: 184 10*3/uL (ref 150–400)
RDW: 15 % (ref 11.5–15.5)

## 2012-04-23 LAB — COMPLETE METABOLIC PANEL WITH GFR
ALT: 16 U/L (ref 0–53)
Albumin: 4.2 g/dL (ref 3.5–5.2)
CO2: 25 mEq/L (ref 19–32)
Calcium: 9.4 mg/dL (ref 8.4–10.5)
Chloride: 107 mEq/L (ref 96–112)
Creat: 1.47 mg/dL — ABNORMAL HIGH (ref 0.50–1.35)
GFR, Est African American: 56 mL/min — ABNORMAL LOW
Total Protein: 6.4 g/dL (ref 6.0–8.3)

## 2012-04-23 LAB — T4, FREE: Free T4: 1.48 ng/dL (ref 0.80–1.80)

## 2012-04-23 NOTE — Telephone Encounter (Signed)
[name] placed call to CSW regarding prescription assistance for new medication he has been prescribed.  CSW returned call and left message informing [name] this is the last week to enroll in a Medicare Part D program.  CSW left phone number to [employer].  CSW encouraged [name] to call CSW during CSW business hours to discuss additional program, ie Extra Help program.

## 2012-04-24 ENCOUNTER — Encounter: Payer: Self-pay | Admitting: Licensed Clinical Social Worker

## 2012-04-24 NOTE — Telephone Encounter (Signed)
Mr. Chittum returned call to CSW.  Pt has prescription insurance but voiced concern that Ensure is an over the counter medication and has not utilized prescription benefits to obtain.  Pt has not receives prescription to obtain Ensure and has yet receive a denial from his insurance company. CSW informed Mr. Chester, [employer] has program to apply for assistance with obtaining Ensure.  However, will need PCP signature.  Discussed this application with Mr. Weatherall, pt in agreement for CSW to mail application to pt for completion and signature.  CSW will discuss with PCP, as pt will need prescription.  Pt will need denial letter from insurance company that they will not cover Ensure.  CSW encouraged Mr. Murin to explore generic option of Ensure in the meantime.  CSW will wait for return of pt signed and completed application.  CSW will discuss with PCP.

## 2012-04-27 ENCOUNTER — Other Ambulatory Visit: Payer: Self-pay | Admitting: Internal Medicine

## 2012-04-27 MED ORDER — ENSURE PLUS PO LIQD: 237.0000 mL | Freq: Two times a day (BID) | ORAL | Status: DC

## 2012-05-01 ENCOUNTER — Telehealth: Payer: Self-pay | Admitting: Licensed Clinical Social Worker

## 2012-05-01 NOTE — Telephone Encounter (Signed)
Call placed to [name].  Pt received CSW information and Prescription Assistance Forms.  CSW informed [name] forms can be left for CSW on Monday, during pt's coumadin clinic.  CSW has Rx for Ensure, pt notified CSW will leave in envelope at front desk.  CSW in need of denial letter from insurance co/pharmacy to complete Prescription Assistance form.

## 2012-05-04 ENCOUNTER — Ambulatory Visit (INDEPENDENT_AMBULATORY_CARE_PROVIDER_SITE_OTHER): Payer: Medicare Other | Admitting: Pharmacist

## 2012-05-04 ENCOUNTER — Other Ambulatory Visit: Payer: Self-pay | Admitting: *Deleted

## 2012-05-04 DIAGNOSIS — Z7901 Long term (current) use of anticoagulants: Secondary | ICD-10-CM

## 2012-05-04 DIAGNOSIS — I81 Portal vein thrombosis: Secondary | ICD-10-CM

## 2012-05-04 NOTE — Telephone Encounter (Signed)
There is a signed prescription on file awaiting pickup done by Dr. Rogelia Boga in October.

## 2012-05-04 NOTE — Patient Instructions (Signed)
Patient instructed to take medications as defined in the Anti-coagulation Track section of this encounter.  Patient instructed to take today's dose.  Patient verbalized understanding of these instructions.    

## 2012-05-04 NOTE — Progress Notes (Signed)
Anti-Coagulation Progress Note  [name]. is a 66 y.o. male who is currently on an anti-coagulation regimen.    RECENT RESULTS: Recent results are below, the most recent result is correlated with a dose of 67.5 mg. per week: Lab Results  Component Value Date   INR 3.90 05/04/2012   INR 3.30 04/20/2012   INR 7.0 04/06/2012    ANTI-COAG DOSE:   Latest dosing instructions   Total Sun Mon Tue Wed Thu Fri Sat   62.5 10 mg 7.5 mg 10 mg 7.5 mg 10 mg 7.5 mg 10 mg    (5 mg2) (5 mg1.5) (5 mg2) (5 mg1.5) (5 mg2) (5 mg1.5) (5 mg2)         ANTICOAG SUMMARY: Anticoagulation Episode Summary              Current INR goal  Next INR check 05/25/2012   INR from last check 3.90 (05/04/2012)     Weekly max dose (mg)  Target end date    Indications Long-term (current) use of anticoagulants [V58.61], Atrila fibrillation (Resolved) [427.31], Thrombosis, 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]   INR check location  Preferred lab    Send INR reminders to ANTICOAG IMP   Comments        Provider Role Specialty Phone number   [name], MD  Internal Medicine [phone]        ANTICOAG TODAY: Anticoagulation Summary as of 05/04/2012              INR goal      Selected INR 3.90 (05/04/2012) Next INR check 05/25/2012   Weekly max dose (mg)  Target end date    Indications Long-term (current) use of anticoagulants [V58.61], Atrila fibrillation (Resolved) [427.31], Thrombosis, 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  Patient instructed to take today's dose.  Patient verbalized understanding of these instructions.         FOLLOW-UP Return in 3 weeks (on 05/25/2012) for Follow up INR at 3:45PM.  [name], III Pharm.D., CACP

## 2012-05-07 ENCOUNTER — Encounter: Payer: Self-pay | Admitting: Licensed Clinical Social Worker

## 2012-05-07 NOTE — Progress Notes (Signed)
Patient ID: Travis Convey., male   DOB: Dec 18, 1945, 66 y.o.   MRN: [id number] CSW has been working with [name] to obtain assistance for Ensure cost.  Referral called in to [facility] Patient Assistance Program [phone] for benefit verification.

## 2012-05-22 NOTE — Progress Notes (Signed)
Patient ID: [name]., male   DOB: 05/10/1946, 67 y.o.   MRN: [id number] Received call today from [employer] program.  [employer] no longer accepting applications for Patient Assistance Program and Path[employer] reimbursement program only.  Will look into SNAP program as [employer] states the may offer assistance.

## 2012-05-25 ENCOUNTER — Ambulatory Visit: Payer: Medicare Other

## 2012-05-28 ENCOUNTER — Other Ambulatory Visit: Payer: Self-pay | Admitting: Internal Medicine

## 2012-06-02 ENCOUNTER — Other Ambulatory Visit: Payer: Self-pay | Admitting: *Deleted

## 2012-06-04 ENCOUNTER — Telehealth: Payer: Self-pay | Admitting: Internal Medicine

## 2012-06-04 ENCOUNTER — Other Ambulatory Visit: Payer: Self-pay | Admitting: Internal Medicine

## 2012-06-04 MED ORDER — OXYCODONE-ACETAMINOPHEN 10-325 MG PO TABS: 1.0000 | ORAL_TABLET | Freq: Four times a day (QID) | ORAL | Status: DC | PRN

## 2012-06-04 NOTE — Telephone Encounter (Signed)
[name] presented to clinic requesting a refill on his chronic pain medication, Oxycodone/tylenol 10/325, #120 per month.  He is a patient of Dr. Meredith Pel.  I discussed the case with Dr. Meredith Pel who is attending on the inpatient service at this time and he agreed that refill is appropriate.  I will refill this Rx at this time.

## 2012-06-10 NOTE — Progress Notes (Signed)
Note reviewed.  Agree with plan by Dr. Groce for anti-coagulation.  I did not personally see the patient.   Signed  Niyah Mamaril  

## 2012-06-13 ENCOUNTER — Other Ambulatory Visit: Payer: Self-pay | Admitting: Internal Medicine

## 2012-06-29 ENCOUNTER — Other Ambulatory Visit: Payer: Self-pay | Admitting: Internal Medicine

## 2012-06-29 NOTE — Telephone Encounter (Signed)
Can we get patient an appointment this week for INR check and to discuss changing his antidepressant?

## 2012-07-01 MED ORDER — BUPROPION HCL 75 MG PO TABS: 75.0000 mg | ORAL_TABLET | Freq: Every morning | ORAL | Status: DC

## 2012-07-01 NOTE — Telephone Encounter (Signed)
I received a refill request for fluoxetine (Prozac), but do to the potential drug interaction with his Multaq I am concerned about continuing fluoxetine.  I called patient and discussed this with him today.  He reports that his depression was doing as well on Wellbutrin as it has done since the change to fluoxetine, and he is comfortable stopping fluoxetine and resuming Wellbutrin at previous dose of 75 mg daily.  He stated that he does not need a refill on the Wellbutrin because he still has a bottle remaining that is in date from his previous prescription.    He has an appointment Friday with Dr. Loistine Chance, and should have an INR check at that time.

## 2012-07-03 ENCOUNTER — Ambulatory Visit: Payer: Medicare Other | Admitting: Internal Medicine

## 2012-07-06 ENCOUNTER — Ambulatory Visit (INDEPENDENT_AMBULATORY_CARE_PROVIDER_SITE_OTHER): Payer: Medicare Other | Admitting: Internal Medicine

## 2012-07-06 ENCOUNTER — Ambulatory Visit (HOSPITAL_COMMUNITY)
Admission: RE | Admit: 2012-07-06 | Discharge: 2012-07-06 | Disposition: A | Discharge status: Home / Self Care | Payer: Medicare Other | Source: Ambulatory Visit | Attending: Internal Medicine | Admitting: Internal Medicine

## 2012-07-06 ENCOUNTER — Ambulatory Visit: Payer: Medicare Other | Admitting: Internal Medicine

## 2012-07-06 ENCOUNTER — Other Ambulatory Visit: Payer: Self-pay | Admitting: *Deleted

## 2012-07-06 ENCOUNTER — Ambulatory Visit: Payer: Medicare Other | Admitting: Pharmacist

## 2012-07-06 ENCOUNTER — Encounter: Payer: Self-pay | Admitting: Internal Medicine

## 2012-07-06 VITALS — BP 119/70 | HR 68 | Temp 97.7°F | Ht 75.0 in | Wt 158.2 lb

## 2012-07-06 DIAGNOSIS — F329 Major depressive disorder, single episode, unspecified: Secondary | ICD-10-CM

## 2012-07-06 DIAGNOSIS — R079 Chest pain, unspecified: Secondary | ICD-10-CM | POA: Insufficient documentation

## 2012-07-06 DIAGNOSIS — I4891 Unspecified atrial fibrillation: Secondary | ICD-10-CM

## 2012-07-06 DIAGNOSIS — Z7901 Long term (current) use of anticoagulants: Secondary | ICD-10-CM

## 2012-07-06 DIAGNOSIS — M25551 Pain in right hip: Secondary | ICD-10-CM

## 2012-07-06 DIAGNOSIS — I81 Portal vein thrombosis: Secondary | ICD-10-CM

## 2012-07-06 DIAGNOSIS — M25559 Pain in unspecified hip: Secondary | ICD-10-CM

## 2012-07-06 MED ORDER — OXYCODONE-ACETAMINOPHEN 10-325 MG PO TABS: 1.0000 | ORAL_TABLET | Freq: Four times a day (QID) | ORAL | Status: DC | PRN

## 2012-07-06 NOTE — Patient Instructions (Signed)
Patient instructed to take medications as defined in the Anti-coagulation Track section of this encounter.  Patient instructed to take today's dose.  Patient verbalized understanding of these instructions.    

## 2012-07-06 NOTE — Telephone Encounter (Signed)
Refill printed and signed - nurse to complete. 

## 2012-07-06 NOTE — Patient Instructions (Addendum)
General Instructions: Please schedule a follow up appointment in 1-2 months . Please bring your medication bottles with your next appointment. Please take your medicines as prescribed. I will call you with your lab results if anything will be abnormal. Please stop taking prozac!    Treatment Goals:  Goals (1 Years of Data) as of 07/06/12         As of Today 04/22/12 03/23/12 03/19/12 03/19/12     Blood Pressure    . Blood Pressure < 140/90  119/70 120/80 108/61 102/88 104/67      Progress Toward Treatment Goals:  Treatment Goal 07/06/2012  Blood pressure at goal  Stop smoking smoking the same amount    Self Care Goals & Plans:  Self Care Goal 07/06/2012  Manage my medications take my medicines as prescribed; bring my medications to every visit; refill my medications on time; follow the sick day instructions if I am sick  Monitor my health keep track of my weight  Eat healthy foods eat more vegetables; eat fruit for snacks and desserts; eat smaller portions  Be physically active take a walk every day; find an activity I enjoy       Care Management & Community Referrals:  Referral 07/06/2012  Referrals made for care management support none needed  Referrals made to community resources -

## 2012-07-06 NOTE — Progress Notes (Signed)
Subjective:   Patient ID: Travis LIPS Sr. male   DOB: Jan 16, 1946 67 y.o.   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  Chest pain : He reports that  he had an episode of chest pain about 10 days .The pain was located on the left side of the chest,  described it as dull achy constant discomfort , lasting for 24 hours with no radiation , associated with shortness of breath . Denied any nausea ,vomiting palpitations or diaphoresis associated with it.  He again had recurrent episode about 5 days ago, that lasted for a few hours and was in the same location. He does report having chronic cough where he brings whitish phlegm .Of note he is a smoker and continues to smoke every day . He did notice some increased urinary frequency ,co- incident over the same period of time. Denied any abdominal pain, dysuria or urgency and any urinary symptoms with today's visit.  Chronic pain syndrome/ right hip pain : He is requesting refills on his percocets.  Atrial fibrillation on chronic coumadin: He also wants to get his INR checked and needs refill on Coumadin.  Depression: Stable. He is on Wellbutrin and Prozac for his depression.  Per Dr. Meredith Pel, his Prozac needs to be discontinued given the possible QTc prolongation with its drug interactions.     Past Medical History  Diagnosis Date  . Hypertension   . Renal insufficiency     Renal U/S 12/04/2009 showed no pathological findings. Labs 12/04/2009 include normal ESR, C3, C4; neg ANA; SPEP showed nonspecific increase in the alpha-2 region with no M-spike; UPEP showed no monoclonal free light chains; urine IFE showed polyclonal increase in feree Kappa and/or free Lambda light chains.  . Chronic systolic heart failure   . Depression    . GERD (gastroesophageal reflux disease)   . Portal vein thrombosis   . Avascular necrosis     right hip s/p replacement  . Gastritis   . Alcohol abuse   . Erectile dysfunction   . Bipolar disorder   . Hydrocele, unspecified   . Intermittent claudication   . Edema   . Lung nodule     Chest CT scan on 12/14/2008 showed a nodular opacity at the left lung base felt to most likely represent scarring.  Follow-up chest CT scan on 06/02/2010 showed parenchymal scarring in the left apex, left  lower lobe, and lingula; some of this scarring at the left base had a nodular appearance, unchanged.  . 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  Thyroid scan 08/28/2009 was normal with no focal areas of abnormal increased or decreased activity seen; the uptake of I 131 sodium iodide at 24 hours was 5.7%.  TSH and free T4 normalized by 08/16/2009.  [name] Pneumonia   . Intestinal obstruction   . Chronic systolic heart failure   . Other primary cardiomyopathies     2D echo on 02/06/2010 showed normal LV size with mild global hypokinesis, estimated EF 45%; mild biatrial enlargement; mild pulmonary hypertension (PA systolic pressure 42 mmHg assuming RA pressure 10 mmHg); normal RV size with mild systolic dysfunction.  [name] COPD (chronic obstructive pulmonary disease)   . Clostridium difficile colitis   . E coli bacteremia   . Cluster  headaches   . DVT (deep venous thrombosis)     He has hypercoagulability with multiple occluded venous vessels  . Pleural plaque     Chest CT on 06/02/2010 showed stable extensive calcified pleural plaques involving the left hemithorax, consistent with asbestos related pleural disease.  . Weight loss     Normal colonoscopy by Dr. [name] on 11/06/2010.  . Atrial fibrillation with rapid ventricular response   . 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 Atrial fibrillation     Paroxysmal, failed medical therapy with  amiodarone but has done very well with multaq  . Anginal pain     "every once in awhile now" (03/17/2012)  . Pacemaker   . Shortness of breath     "all the time" (03/17/2012)  . Osteoarthritis   . Spondylosis   . Chronic lower back pain     "cause of my hip" (03/17/2012)   Family History  Problem Relation Age of Onset  . Hypertension Mother   . Cancer Brother     Unsure of type.  . Asthma Mother   . Coronary artery disease Mother   . Colon cancer Neg Hx   . Lung cancer Neg Hx   . Prostate cancer Neg Hx    History   Social History  . Marital Status: Widowed    Spouse Name: N/A    Number of Children: 10  . Years of Education: N/A   Occupational History  . RETIRED    Social History Main Topics  . Smoking status: Current Every Day Smoker -- 0.50 packs/day for 40 years    Types: Cigarettes  . Smokeless tobacco: Never Used     Comment: encouraged to quit today, not ready to quit  . Alcohol Use: No     Comment: 03/17/2012 "don't drink alcohol now; used to; never had problems w/it"  . Drug Use: 1.00 per week    Special: Marijuana     Comment: 03/17/2012 "last marijuana was last week"  . Sexually Active: Yes   Other Topics Concern  . Not on file   Social History Narrative   Works as a Media planner part time   Review of Systems: General: Denies fever, chills, diaphoresis, appetite change and fatigue. HEENT: Denies photophobia, eye pain, redness, hearing loss, ear pain, congestion, sore throat, rhinorrhea, sneezing, mouth sores, trouble swallowing, neck pain, neck stiffness and tinnitus. Respiratory: Denies + SOB, DOE, + cough, chest tightness, and wheezing. Cardiovascular: Denies to + chest pain, palpitations and leg swelling. Gastrointestinal: Denies nausea, vomiting, abdominal pain, diarrhea, constipation, blood in stool and abdominal distention. Genitourinary: Denies dysuria, urgency, frequency, hematuria, flank pain and difficulty urinating. Musculoskeletal: Denies  myalgias, back pain, joint swelling, arthralgias and gait problem.  Skin: Denies pallor, rash and wound. Neurological: Denies dizziness, seizures, syncope, weakness, light-headedness, numbness and headaches. Hematological: Denies adenopathy, easy bruising, personal or family bleeding history. Psychiatric/Behavioral: Denies suicidal ideation, mood changes, confusion, nervousness, sleep disturbance and agitation.    Current Outpatient Medications: Current Outpatient Prescriptions  Medication Sig Dispense Refill  . albuterol (PROAIR HFA) 108 (90 BASE) MCG/ACT inhaler Inhale 2 puffs into the lungs every 6 (six) hours as needed for wheezing or shortness of breath.  1 Inhaler  6  . buPROPion (WELLBUTRIN) 75 MG tablet Take 1 tablet (75 mg total) by mouth every morning.      . diltiazem (CARDIZEM CD) 240 MG 24 hr capsule Take 240 mg by mouth every morning.      [name]  dronedarone (MULTAQ) 400 MG tablet Take 400 mg by mouth every morning.      . Ensure Plus (ENSURE PLUS) LIQD Take 237 mLs by mouth 2 (two) times daily between meals.  60 Can  5  . FLUoxetine (PROZAC) 20 MG capsule Take 1 capsule (20 mg total) by mouth daily.  30 capsule  2  . multivitamin (THERAGRAN) per tablet Take 1 tablet by mouth daily.  120 tablet  2  . omeprazole (PRILOSEC) 20 MG capsule TAKE 2 CAPSULES (40 MG TOTAL) BY MOUTH DAILY.  60 capsule  6  . oxyCODONE-acetaminophen (PERCOCET) 10-325 MG per tablet Take 1 tablet by mouth every 6 (six) hours as needed. For pain  120 tablet  0  . tiotropium (SPIRIVA) 18 MCG inhalation capsule Place 1 capsule (18 mcg total) into inhaler and inhale daily.  30 capsule  0  . warfarin (COUMADIN) 5 MG tablet Take as directed.  45 tablet  0   No current facility-administered medications for this visit.    Allergies: Allergies  Allergen Reactions  . Penicillins Rash      Objective:   Physical Exam: Filed Vitals:   07/06/12 1556  BP: 119/70  Pulse: 68  Temp: 97.7 F (36.5 C)    General:  Vital signs reviewed and noted. Well-developed, well-nourished, in no acute distress; alert, appropriate and cooperative throughout examination. Head: Normocephalic, atraumatic Lungs: Normal respiratory effort. Clear to auscultation BL without crackles or wheezes. Heart: RRR. S1 and S2 normal without gallop, murmur, or rubs. Abdomen:BS normoactive. Soft, Nondistended, non-tender.  No masses or organomegaly. Extremities: No pretibial edema.     Assessment & Plan:

## 2012-07-06 NOTE — Progress Notes (Signed)
Anti-Coagulation Progress Note  [name]. is a 67 y.o. male who is currently on an anti-coagulation regimen.    RECENT RESULTS: Recent results are below, the most recent result is correlated with a dose of 62.5 mg. per week: Lab Results  Component Value Date   INR 1.1 07/06/2012   INR 3.90 05/04/2012   INR 3.30 04/20/2012    ANTI-COAG DOSE: Anticoagulation Dose Instructions as of 07/06/2012     [name] Tue Wed Thu Fri Sat   New Dose 10 mg 7.5 mg 10 mg 7.5 mg 10 mg 7.5 mg 10 mg       ANTICOAG SUMMARY: Anticoagulation Episode Summary   Current INR goal   Next INR check 07/27/2012  INR from last check 1.1 (07/06/2012)  Weekly max dose   Target end date   INR check location   Preferred lab   Send INR reminders to ANTICOAG IMP   Indications  Long-term (current) use of anticoagulants [V58.61] Atrila fibrillation (Resolved) [427.31] Thrombosis 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]        Comments       Anticoagulation Care Providers   Provider Role Specialty Phone number   [name], MD  Internal Medicine [phone]      ANTICOAG TODAY: Anticoagulation Summary as of 07/06/2012   INR goal   Selected INR 1.1 (07/06/2012)  Next INR check 07/27/2012  Target end date    Indications  Long-term (current) use of anticoagulants [V58.61] Atrila fibrillation (Resolved) [427.31] Thrombosis 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]      Anticoagulation Episode Summary   INR check location    Preferred lab    Send INR reminders to ANTICOAG IMP   Comments     Anticoagulation Care Providers   Provider Role Specialty Phone number   [name], MD  Internal Medicine [phone]      PATIENT INSTRUCTIONS: Patient Instructions  Patient instructed to take medications as defined in the Anti-coagulation Track section of this encounter.  Patient instructed to take today's dose.  Patient verbalized understanding of these instructions.       FOLLOW-UP Return in 3 weeks (on 07/27/2012) for Follow  up INR at 4PM.  [name], III Pharm.D., CACP

## 2012-07-07 DIAGNOSIS — R079 Chest pain, unspecified: Secondary | ICD-10-CM | POA: Insufficient documentation

## 2012-07-07 NOTE — Assessment & Plan Note (Addendum)
Reports having intermittent episodes of chest pain at rest for last 10 days associated with shortness of breath. Differentials include angina versus respiratory issues related to his COPD vs GERD. EKG was obtained in the clinic that showed some new T wave inversions in lead V5 as compared to his old EKG ( 11/13). Rest of the EKG was comparable to old EKG with T wave inversions /flattening in leads 1 ,aVL, V4 and V6. Given the new change stat troponin was checked which was negative. I think his chest pain could be related to his continued tobacco use and COPD. His Echo from 2011 was also reviewed.  -Patient was scheduled an appointment with Adolph Pollack /Dr. Allred to for followup on his chest pain and possible pacemaker eveluation.  -He was counseled on tobacco cessation.

## 2012-07-07 NOTE — Assessment & Plan Note (Signed)
Stable. Prescription for Percocet was refilled.

## 2012-07-07 NOTE — Assessment & Plan Note (Signed)
Stable. Denies any suicidal ideations /thoughts. Discontinue her Prozac given QTC prolongation with Wellbutrin and Multaq.   Patient had QTc of 469 msec on his EKG from today.

## 2012-07-07 NOTE — Addendum Note (Signed)
Addended by: Elyse Jarvis on: 07/07/2012 04:11 PM   Modules accepted: Orders, Medications

## 2012-07-07 NOTE — Progress Notes (Signed)
Pt aware of appt with Dr Johney Frame PA 07/08/12 9:45AM - [facility]. Stanton Kidney Traven Davids RN 07/06/12 5PM

## 2012-07-07 NOTE — Assessment & Plan Note (Signed)
His INR was 1.1, Coumadin dose adjustments were made by Dr. Alexandria Lodge.

## 2012-07-08 ENCOUNTER — Other Ambulatory Visit: Payer: Self-pay | Admitting: Internal Medicine

## 2012-07-08 ENCOUNTER — Encounter: Payer: Self-pay | Admitting: Physician Assistant

## 2012-07-08 ENCOUNTER — Ambulatory Visit (INDEPENDENT_AMBULATORY_CARE_PROVIDER_SITE_OTHER): Payer: Medicare Other | Admitting: Physician Assistant

## 2012-07-08 ENCOUNTER — Ambulatory Visit (INDEPENDENT_AMBULATORY_CARE_PROVIDER_SITE_OTHER): Payer: Medicare Other | Admitting: *Deleted

## 2012-07-08 VITALS — BP 80/40 | Ht 75.0 in | Wt 161.6 lb

## 2012-07-08 DIAGNOSIS — I495 Sick sinus syndrome: Secondary | ICD-10-CM

## 2012-07-08 DIAGNOSIS — F17200 Nicotine dependence, unspecified, uncomplicated: Secondary | ICD-10-CM

## 2012-07-08 DIAGNOSIS — I4891 Unspecified atrial fibrillation: Secondary | ICD-10-CM

## 2012-07-08 DIAGNOSIS — I10 Essential (primary) hypertension: Secondary | ICD-10-CM

## 2012-07-08 DIAGNOSIS — R079 Chest pain, unspecified: Secondary | ICD-10-CM

## 2012-07-08 DIAGNOSIS — I5022 Chronic systolic (congestive) heart failure: Secondary | ICD-10-CM

## 2012-07-08 LAB — PACEMAKER DEVICE OBSERVATION
BRDY-0004RV: 120 {beats}/min
DEVICE MODEL PM: 2045834
RV LEAD AMPLITUDE: 6.2 mv
RV LEAD IMPEDENCE PM: 379 Ohm
VENTRICULAR PACING PM: 3.1

## 2012-07-08 NOTE — Assessment & Plan Note (Signed)
Patient's blood pressure is on the low side today. He says it's usually normal. On 07/06/12 it was 119/70. He says these only dizzy occasionally if he gets up too quickly. I asked him to call his this worsens and we can decrease his Cardizem. He has had a 40 pound weight loss over the past year which is probably contributing to the low blood pressure today.

## 2012-07-08 NOTE — Progress Notes (Signed)
HPI: This is a 67 year old male patient of Dr. All read who has a history of tachybradycardia syndrome status post pacemaker, atrial fibrillation on chronic Coumadin, nonischemic cardiomyopathy ejection fraction 45% on echo in 2011. Cardiac catheterization in 2003 showed normal coronary arteries with LV dysfunction.  Patient saw his primary care provider and complained of an episode of chest pain approximately 2 weeks ago. EKG on 07/06/12 showed more prominent T-wave inversion in leads V3 through V5. Stat troponin was negative. It was felt his chest pain could be related to his continued tobacco use and COPD versus GERD. An appointment was made for him to be seen here today. He is overdue for his pacemaker check.  Patient describes the chest pain as a sharp shooting pain in his left chest that caused him to hyperventilate and become very anxious. He also had some associated tightness with it. It would come and go all day on that Saturday and then eased off completely. He had a lot of coughing and phlegm associated with it. It would ease up he laid down but return with any movement. The following Tuesday he had some more pain that was similar but not as severe. He has had no further chest pain since. He is not active. He continues to smoke a half a pack of cigarettes daily. He denies associated palpitations.  Allergies:  -- Penicillins -- Rash  Current Outpatient Prescriptions on File Prior to Visit: albuterol (PROAIR HFA) 108 (90 BASE) MCG/ACT inhaler, Inhale 2 puffs into the lungs every 6 (six) hours as needed for wheezing or shortness of breath., Disp: 1 Inhaler, Rfl: 6 buPROPion (WELLBUTRIN) 75 MG tablet, Take 1 tablet (75 mg total) by mouth every morning., Disp: , Rfl:  diltiazem (CARDIZEM CD) 240 MG 24 hr capsule, Take 240 mg by mouth every morning., Disp: , Rfl:  dronedarone (MULTAQ) 400 MG tablet, Take 400 mg by mouth every morning., Disp: , Rfl:  Ensure Plus (ENSURE PLUS) LIQD, Take 237 mLs by  mouth 2 (two) times daily between meals., Disp: 60 Can, Rfl: 5 multivitamin (THERAGRAN) per tablet, Take 1 tablet by mouth daily., Disp: 120 tablet, Rfl: 2 omeprazole (PRILOSEC) 20 MG capsule, TAKE 2 CAPSULES (40 MG TOTAL) BY MOUTH DAILY., Disp: 60 capsule, Rfl: 6 oxyCODONE-acetaminophen (PERCOCET) 10-325 MG per tablet, Take 1 tablet by mouth every 6 (six) hours as needed for pain., Disp: 120 tablet, Rfl: 0 tiotropium (SPIRIVA) 18 MCG inhalation capsule, Place 1 capsule (18 mcg total) into inhaler and inhale daily., Disp: 30 capsule, Rfl: 0 warfarin (COUMADIN) 5 MG tablet, Take as directed., Disp: 45 tablet, Rfl: 0  No current facility-administered medications on file prior to visit.   Past Medical History:   Hypertension                                                 Renal insufficiency                                            Comment:Renal U/S 12/04/2009 showed no pathological               findings. Labs 12/04/2009 include normal ESR,               C3, C4; neg  ANA; SPEP showed nonspecific               increase in the alpha-2 region with no M-spike;              UPEP showed no monoclonal free light chains;               urine IFE showed polyclonal increase in feree               Kappa and/or free Lambda light chains.   Chronic systolic heart failure                               Depression                                                   GERD (gastroesophageal reflux disease)                       Portal vein thrombosis                                       Avascular necrosis                                             Comment:right hip s/p replacement   Gastritis                                                    Alcohol abuse                                                Erectile dysfunction                                         Bipolar disorder                                             Hydrocele, unspecified                                       Intermittent claudication                                     Edema  Lung nodule                                                    Comment:Chest CT scan on 12/14/2008 showed a nodular               opacity at the left lung base felt to most               likely represent scarring.  Follow-up chest CT               scan on 06/02/2010 showed parenchymal scarring               in the left apex, left  lower lobe, and               lingula; some of this scarring at the left base              had a nodular appearance, unchanged.   Hyperthyroidism                                                Comment:Likely due to thyroiditis with possible               amiodarone association.  Thyroid scan 08/28/2009              was normal with no focal areas of abnormal               increased or decreased activity seen; the               uptake of I 131 sodium iodide at 24 hours was               5.7%.  TSH and free T4 normalized by 08/16/2009.   Pneumonia                                                    Intestinal obstruction                                       Chronic systolic heart failure                               Other primary cardiomyopathies                                 Comment:2D echo on 02/06/2010 showed normal LV size with              mild global hypokinesis, estimated EF 45%; mild              biatrial enlargement; mild pulmonary               hypertension (PA systolic pressure 42 mmHg               assuming RA pressure 10  mmHg); normal RV size               with mild systolic dysfunction.   COPD (chronic obstructive pulmonary disease)                 Clostridium difficile colitis                                E coli bacteremia                                            Cluster headaches                                            DVT (deep venous thrombosis)                                   Comment:He has hypercoagulability with multiple                occluded venous vessels   Pleural plaque                                                 Comment:Chest CT on 06/02/2010 showed stable extensive               calcified pleural plaques involving the left               hemithorax, consistent with asbestos related               pleural disease.   Weight loss                                                    Comment:Normal colonoscopy by Dr. [name] on 11/06/2010.   Atrial fibrillation with rapid ventricular res*              Tachycardia-bradycardia syndrome                               Comment:s/p pacemaker Oct. 04   Atrial fibrillation                                            Comment:Paroxysmal, failed medical therapy with               amiodarone but has done very well with multaq   Anginal pain                                                   Comment:"every once in awhile now" (03/17/2012)  Pacemaker                                                    Shortness of breath                                            Comment:"all the time" (03/17/2012)   Osteoarthritis                                               Spondylosis                                                  Chronic lower back pain                                        Comment:"cause of my hip" (03/17/2012)  Past Surgical History:   Left hip bipolar hemiarthroplasty                09/09/2002     Comment:By Dr. Kristeen Miss   INSERT / REPLACE / REMOVE PACEMAKER              02/2003        Comment:By Dr. Charolette Child, gen change by Fawn Kirk   TOTAL HIP ARTHROPLASTY                           03/08/2008     Comment:right; By Dr. Charlesetta Shanks  Review of patient's family history indicates:   Hypertension                   Mother                   Cancer                         Brother                    Comment: Unsure of type.   Asthma                         Mother                   Coronary artery disease        Mother                   Colon cancer                   Neg  Hx                   Lung cancer                    Neg Hx  Prostate cancer                Neg Hx                   Social History   Marital Status: Widowed             Spouse Name:                      Years of Education:                 Number of children: 10          Occupational History Occupation          Psychiatric nurse              RETIRED                                   Social History Main Topics   Smoking Status: Current Every Day Smoker        Packs/Day: 0.50  Years: 40        Types: Cigarettes   Smokeless Status: Never Used                       Comment: encouraged to quit today, not ready to quit   Alcohol Use: No                Comment: 03/17/2012 "don't drink alcohol now; used               to; never had problems w/it"   Drug Use: Yes           Frequency: 1.00 per week      Special: Marijuana      Comment: 03/17/2012 "last marijuana was last week"   Sexual Activity: Yes                Other Topics            Concern   None on file  Social History Narrative   Works as a Media planner part time    ZOX:[id number] a 40 pound weight loss over the past year due to who left of appetite. He is being followed closely by primary care for this and is taking Ensure. Otherwise see history of present illness   PHYSICAL EXAM: Thin, in no acute distress. Neck: No JVD, HJR, Bruit, or thyroid enlargement  Lungs: Decreased breath sounds throughout,No tachypnea, clear without wheezing, rales, or rhonchi  Cardiovascular: RRR, PMI not displaced,2/6 systolic murmur at the apex, no gallops, bruit, thrill, or heave.  Abdomen: BS normal. Soft without organomegaly, masses, lesions or tenderness.  Extremities: without cyanosis, clubbing or edema. Good distal pulses bilateral  SKin: Warm, no lesions or rashes   Musculoskeletal: No deformities  Neuro: no focal signs  BP 80/40  Ht 6\' 3"  (1.905 m)  Wt 161 lb 9.6 oz (73.301 kg)  BMI 20.2  kg/m2    JYN:WGNFAO sinus rhythm with LVH nonspecific T wave inversion EKG today looks similar to prior tracings he did have T wave eversion that was more prominent on EKG performed on 07/06/12.  2Decho 2011: Study Conclusions   - Left ventricle: The cavity size was normal. Wall thickness was    increased in a pattern of mild LVH. Systolic  function was mildly    reduced. The estimated ejection fraction was 45%. Mild global    hypokinesis. Indeterminant diastolic function.  - Aortic valve: There was no stenosis. Mild regurgitation.  - Aorta: Borderline dilated aortic root, mildly dilated ascending    aorta.  - Mitral valve: Trivial regurgitation.  - Left atrium: The atrium was mildly dilated.  - Right ventricle: The cavity size was normal. Pacer wire or    catheter noted in right ventricle. Systolic function was mildly    reduced.  - Right atrium: The atrium was mildly dilated.  - Tricuspid valve: Peak RV-RA gradient:92mm Hg (S).  - Pulmonary arteries: PA peak pressure: 42mm Hg (S).  - Systemic veins: The IVC was not visualized.  Impressions:   - Normal LV size with mild global hypokinesis, estimated EF 45%.    Mild biatrial enlargement. Mild pulmonary hypertension (PA    systolic pressure 42 mmHg assuming RA pressure 10 mmHg). Normal RV    size with mild systolic dysfunction.  Cardiac cath 2003:  LEFT VENTRICULOGRAM:  The left ventriculogram showed a generalized  hypokinesia with ejection fraction of 35%.  There was also mild mitral  regurgitation.    IMPRESSION:  1. Normal coronaries.   2. Moderate left ventricular systolic dysfunction.  3.     Mild mitral regurgitation.  4. Noncardiac chest pain.

## 2012-07-08 NOTE — Assessment & Plan Note (Signed)
Patient had an episode of chest pain approximately 2 weeks ago that is not typical for angina. He has a history of normal coronary arteries on catheter in 2003. He does have multiple cardiac risk factors including ongoing tobacco abuse. He had an abnormal EKG on 07/06/12 showing more prominent T-wave inversion in V3 through V5. EKG today is similar to prior EKGs. Troponin on the 2/17 was negative. We will order a Lexiscan to rule out ischemia. Follow up with Dr. Johney Frame.

## 2012-07-08 NOTE — Assessment & Plan Note (Signed)
No evidence of heart failure on exam today. 

## 2012-07-08 NOTE — Patient Instructions (Addendum)
Your physician recommends that you schedule a follow-up appointment in: April  WITH DR [facility]  Your physician recommends that you continue on your current medications as directed. Please refer to the Current Medication list given to you today.  Your physician has requested that you have a lexiscan myoview. For further information please visit [email or web address] Please follow instruction sheet, as given.

## 2012-07-08 NOTE — Assessment & Plan Note (Signed)
Patient has a pacemaker. He has missed his last 2 appointments for pacer check. Pacer was checked today and is functioning normally. No adjustments were made. Follow-up with Dr. Johney Frame in April.

## 2012-07-08 NOTE — Assessment & Plan Note (Signed)
In normal sinus rhythm 

## 2012-07-08 NOTE — Progress Notes (Signed)
Pacer check in clinic  

## 2012-07-08 NOTE — Assessment & Plan Note (Signed)
Smoking cessation recommended 

## 2012-07-13 ENCOUNTER — Ambulatory Visit (HOSPITAL_COMMUNITY): Payer: Medicare Other | Attending: Internal Medicine | Admitting: Radiology

## 2012-07-13 VITALS — BP 124/74 | Ht 75.0 in | Wt 154.0 lb

## 2012-07-13 DIAGNOSIS — R0789 Other chest pain: Secondary | ICD-10-CM | POA: Insufficient documentation

## 2012-07-13 DIAGNOSIS — R9431 Abnormal electrocardiogram [ECG] [EKG]: Secondary | ICD-10-CM

## 2012-07-13 DIAGNOSIS — I10 Essential (primary) hypertension: Secondary | ICD-10-CM | POA: Insufficient documentation

## 2012-07-13 DIAGNOSIS — Z950 Presence of cardiac pacemaker: Secondary | ICD-10-CM | POA: Insufficient documentation

## 2012-07-13 DIAGNOSIS — I4891 Unspecified atrial fibrillation: Secondary | ICD-10-CM | POA: Insufficient documentation

## 2012-07-13 DIAGNOSIS — I428 Other cardiomyopathies: Secondary | ICD-10-CM | POA: Insufficient documentation

## 2012-07-13 DIAGNOSIS — J4489 Other specified chronic obstructive pulmonary disease: Secondary | ICD-10-CM | POA: Insufficient documentation

## 2012-07-13 DIAGNOSIS — R42 Dizziness and giddiness: Secondary | ICD-10-CM | POA: Insufficient documentation

## 2012-07-13 DIAGNOSIS — R0602 Shortness of breath: Secondary | ICD-10-CM | POA: Insufficient documentation

## 2012-07-13 DIAGNOSIS — J449 Chronic obstructive pulmonary disease, unspecified: Secondary | ICD-10-CM | POA: Insufficient documentation

## 2012-07-13 DIAGNOSIS — R079 Chest pain, unspecified: Secondary | ICD-10-CM

## 2012-07-13 DIAGNOSIS — F17200 Nicotine dependence, unspecified, uncomplicated: Secondary | ICD-10-CM | POA: Insufficient documentation

## 2012-07-13 DIAGNOSIS — I509 Heart failure, unspecified: Secondary | ICD-10-CM | POA: Insufficient documentation

## 2012-07-13 DIAGNOSIS — R51 Headache: Secondary | ICD-10-CM | POA: Insufficient documentation

## 2012-07-13 MED ORDER — REGADENOSON 0.4 MG/5ML IV SOLN
0.4000 mg | Freq: Once | INTRAVENOUS | Status: AC
  Administered 2012-07-13: 0.4 mg via INTRAVENOUS

## 2012-07-13 MED ORDER — TECHNETIUM TC 99M SESTAMIBI GENERIC - CARDIOLITE
30.0000 | Freq: Once | INTRAVENOUS | Status: AC | PRN
  Administered 2012-07-13: 30 via INTRAVENOUS

## 2012-07-13 MED ORDER — TECHNETIUM TC 99M SESTAMIBI GENERIC - CARDIOLITE
10.0000 | Freq: Once | INTRAVENOUS | Status: AC | PRN
  Administered 2012-07-13: 10 via INTRAVENOUS

## 2012-07-13 NOTE — Progress Notes (Signed)
[facility] SITE 3 NUCLEAR MED [address] [place], [place] [address] [phone]    Cardiology Nuclear Med Study  [name]. is a 67 y.o. male     MRN : [id number]     DOB: 10-08-1945  Procedure Date: 07/13/2012  Nuclear Med Background Indication for Stress Test:  Evaluation for Ischemia, Abnormal EKG, and Office Visit on 07-06-12 with negative Troponin  History:  COPD and AFIB, CHF,Cardiomyopathy, 2003 Heart Cath: NL with LVD EF:35%, 10/04 Pacemaker: SSS 04/29/07 MPS: EF:29% technically limited the patient couldn't lie still ([facility]) attempted 4xs Cardiac Risk Factors: Claudication, Family History - CAD, History of Smoking, Hypertension and Smoker  Symptoms:  Chest Pain, Chest Tightness, Dizziness and SOB   Nuclear Pre-Procedure Caffeine/Decaff Intake:  None x 12 hrs NPO After: 1:00am   Lungs:  clear O2 Sat: 98% on room air. IV 0.9% NS with Angio Cath:  22g  IV Site: R Antecubital x 1, tolerated well IV Started by:  Irean Hong, RN  Chest Size (in):  40 Cup Size: n/a  Height: 6\' 3"  (1.905 m)  Weight:  154 lb (69.854 kg)  BMI:  Body mass index is 19.25 kg/(m^2). Tech Comments:  n/a    Nuclear Med Study 1 or 2 day study: 1 day  Stress Test Type:  Lexiscan  Reading MD: Charlton Haws, MD  Order Authorizing Provider:  Hillis Range, MD, and Jacolyn Reedy, [facility]  Resting Radionuclide: Technetium 52m Sestamibi  Resting Radionuclide Dose: 11.0 mCi   Stress Radionuclide:  Technetium 1m Sestamibi  Stress Radionuclide Dose: 33.0 mCi           Stress Protocol Rest HR: 50 Stress HR: 76  Rest BP: 124/74 Stress BP: 153/88  Exercise Time (min): n/a METS: n/a   Predicted Max HR: 152 bpm % Max HR: 50 bpm Rate Pressure Product: 21308   Dose of Adenosine (mg):  n/a Dose of Lexiscan: 0.4 mg  Dose of Atropine (mg): n/a Dose of Dobutamine: n/a mcg/kg/min (at max HR)  Stress Test Technologist: Milana Na, EMT-P  Nuclear Technologist:  Domenic Polite, CNMT      Rest Procedure:  Myocardial perfusion imaging was performed at rest 45 minutes following the intravenous administration of Technetium 16m Sestamibi. Rest ECG: V pacing  Stress Procedure:  The patient received IV Lexiscan 0.4 mg over 15-seconds.  Technetium 46m Sestamibi injected at 30-seconds. This patient had sob and a headache with Lexiscan. Quantitative spect images were obtained after a 45 minute delay. Stress ECG: No significant change from baseline ECG  QPS Raw Data Images:  Patient motion noted. Stress Images:  Normal homogeneous uptake in all areas of the myocardium. Rest Images:  Normal homogeneous uptake in all areas of the myocardium. Subtraction (SDS):  No evidence of ischemia. Transient Ischemic Dilatation (Normal <1.22):  1.12 Lung/Heart Ratio (Normal <0.45):  0.34  Quantitative Gated Spect Images QGS EDV:  NA QGS ESV:  NA  Impression Exercise Capacity:  Lexiscan with no exercise. BP Response:  Normal blood pressure response. Clinical Symptoms:  Headache ECG Impression:  No significant ST segment change suggestive of ischemia. Comparison with Prior Nuclear Study: No images to compare  Overall Impression:  Normal stress nuclear study.  LV Ejection Fraction: NA  LV Wall Motion:  NA  Charlton Haws

## 2012-07-15 ENCOUNTER — Telehealth: Payer: Self-pay | Admitting: *Deleted

## 2012-07-15 NOTE — Telephone Encounter (Signed)
Opened in error

## 2012-07-15 NOTE — Telephone Encounter (Signed)
PC to patient. Informed him of results - normal stress myoview.  Patient verified understanding and had no questions regarding results.

## 2012-07-27 ENCOUNTER — Ambulatory Visit: Payer: Medicare Other

## 2012-07-28 ENCOUNTER — Other Ambulatory Visit: Payer: Self-pay | Admitting: *Deleted

## 2012-07-29 ENCOUNTER — Encounter: Payer: Self-pay | Admitting: Internal Medicine

## 2012-07-30 MED ORDER — OXYCODONE-ACETAMINOPHEN 10-325 MG PO TABS: 1.0000 | ORAL_TABLET | Freq: Four times a day (QID) | ORAL | Status: DC | PRN

## 2012-07-30 NOTE — Telephone Encounter (Signed)
Refill printed and signed - nurse to complete.    Note:I had to reprint the prescription because the first time it printed on plain paper; that copy was destroyed.

## 2012-08-03 ENCOUNTER — Ambulatory Visit: Payer: Medicare Other

## 2012-08-03 ENCOUNTER — Ambulatory Visit (INDEPENDENT_AMBULATORY_CARE_PROVIDER_SITE_OTHER): Payer: Medicare Other | Admitting: Pharmacist

## 2012-08-03 DIAGNOSIS — I81 Portal vein thrombosis: Secondary | ICD-10-CM

## 2012-08-03 DIAGNOSIS — Z7901 Long term (current) use of anticoagulants: Secondary | ICD-10-CM

## 2012-08-03 LAB — POCT INR: INR: 1.7

## 2012-08-03 MED ORDER — WARFARIN SODIUM 5 MG PO TABS: ORAL_TABLET | ORAL | Status: DC

## 2012-08-03 NOTE — Progress Notes (Signed)
Anti-Coagulation Progress Note  [name]. is a 67 y.o. male who is currently on an anti-coagulation regimen.    RECENT RESULTS: Recent results are below, the most recent result is correlated with a dose of 62.5 mg. per week: Lab Results  Component Value Date   INR 1.70 08/03/2012   INR 1.1 07/06/2012   INR 3.90 05/04/2012    ANTI-COAG DOSE: Anticoagulation Dose Instructions as of 08/03/2012     [name] Tue Wed Thu Fri Sat   New Dose 10 mg 10 mg 10 mg 7.5 mg 10 mg 7.5 mg 10 mg       ANTICOAG SUMMARY: Anticoagulation Episode Summary   Current INR goal   Next INR check 08/17/2012  INR from last check 1.70 (08/03/2012)  Weekly max dose   Target end date   INR check location   Preferred lab   Send INR reminders to ANTICOAG IMP   Indications  Long-term (current) use of anticoagulants [V58.61] Atrila fibrillation (Resolved) [427.31] Thrombosis 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]        Comments       Anticoagulation Care Providers   Provider Role Specialty Phone number   [name], MD  Internal Medicine [phone]      ANTICOAG TODAY: Anticoagulation Summary as of 08/03/2012   INR goal   Selected INR 1.70 (08/03/2012)  Next INR check 08/17/2012  Target end date    Indications  Long-term (current) use of anticoagulants [V58.61] Atrila fibrillation (Resolved) [427.31] Thrombosis 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]      Anticoagulation Episode Summary   INR check location    Preferred lab    Send INR reminders to ANTICOAG IMP   Comments     Anticoagulation Care Providers   Provider Role Specialty Phone number   [name], MD  Internal Medicine [phone]      PATIENT INSTRUCTIONS: Patient Instructions  Patient instructed to take medications as defined in the Anti-coagulation Track section of this encounter.  Patient instructed to take today's dose.  Patient verbalized understanding of these instructions.       FOLLOW-UP Return in 2 weeks (on 08/17/2012) for  Follow up INR at 2:30PM.  [name], III Pharm.D., CACP

## 2012-08-03 NOTE — Patient Instructions (Signed)
Patient instructed to take medications as defined in the Anti-coagulation Track section of this encounter.  Patient instructed to take today's dose.  Patient verbalized understanding of these instructions.    

## 2012-08-17 ENCOUNTER — Ambulatory Visit: Payer: Medicare Other

## 2012-08-28 ENCOUNTER — Other Ambulatory Visit: Payer: Self-pay | Admitting: *Deleted

## 2012-08-28 MED ORDER — OXYCODONE-ACETAMINOPHEN 10-325 MG PO TABS: 1.0000 | ORAL_TABLET | Freq: Four times a day (QID) | ORAL | Status: DC | PRN

## 2012-08-28 NOTE — Telephone Encounter (Signed)
Pls remind pt not to wait until Friday PM to ask for refill. Printed off.

## 2012-08-28 NOTE — Telephone Encounter (Signed)
Pt called

## 2012-08-28 NOTE — Telephone Encounter (Signed)
Pt will like to pick up rx today. Thanks

## 2012-09-14 ENCOUNTER — Encounter: Payer: Medicare Other | Admitting: Internal Medicine

## 2012-09-14 ENCOUNTER — Other Ambulatory Visit: Payer: Self-pay | Admitting: Internal Medicine

## 2012-09-15 ENCOUNTER — Ambulatory Visit (INDEPENDENT_AMBULATORY_CARE_PROVIDER_SITE_OTHER): Payer: Self-pay | Admitting: Cardiology

## 2012-09-15 ENCOUNTER — Encounter: Payer: Self-pay | Admitting: Cardiology

## 2012-09-15 VITALS — BP 148/84 | HR 49 | Ht 75.0 in | Wt 151.0 lb

## 2012-09-15 DIAGNOSIS — I5022 Chronic systolic (congestive) heart failure: Secondary | ICD-10-CM

## 2012-09-15 DIAGNOSIS — I10 Essential (primary) hypertension: Secondary | ICD-10-CM

## 2012-09-15 DIAGNOSIS — I428 Other cardiomyopathies: Secondary | ICD-10-CM

## 2012-09-15 DIAGNOSIS — Z950 Presence of cardiac pacemaker: Secondary | ICD-10-CM

## 2012-09-15 DIAGNOSIS — I4891 Unspecified atrial fibrillation: Secondary | ICD-10-CM

## 2012-09-15 LAB — PACEMAKER DEVICE OBSERVATION
BRDY-0002RV: 50 {beats}/min
BRDY-0004RV: 120 {beats}/min
RV LEAD THRESHOLD: 1 V
VENTRICULAR PACING PM: 1.3

## 2012-09-15 NOTE — Progress Notes (Signed)
ELECTROPHYSIOLOGY OFFICE NOTE  Patient ID: Travis Capuchin Sr. MRN: [id number], DOB/AGE: 69/26/1947   Date of Visit: 09/15/2012  Primary Physician: [name], MD Primary Cardiologist: Johney Frame, MD Reason for Visit: EP/device follow-up  History of Present Illness  Travis HAQUE Sr. is a pleasant 67 year old man with a NICM, chronic systolic HF, PAF, tachy-brady syndrome s/p PPM implant and HTN who presents today for routine electrophysiology followup. He was last seen by [name], PA-C in Feb 2014 as a work-in appointment for CP. His CP was felt to be atypical but given his multiple risk factors he underwent a Lexiscan Myoview stress test which was negative for ischemia. Since last being seen in our clinic, he reports he is doing well. Today, he denies chest pain or shortness of breath. He denies palpitations, dizziness, near syncope or syncope. He denies LE swelling, orthopnea, PND or recent weight gain. [name] states he has not taken his medications this morning. He normally does not make AM appts because he sleeps late so when he "ran [place] the house" this AM he did not take his meds. Otherwise he reports he is compliant and tolerating medications without difficulty.  Past Medical History Past Medical History  Diagnosis Date  . Hypertension   . Renal insufficiency     Renal U/S 12/04/2009 showed no pathological findings. Labs 12/04/2009 include normal ESR, C3, C4; neg ANA; SPEP showed nonspecific increase in the alpha-2 region with no M-spike; UPEP showed no monoclonal free light chains; urine IFE showed polyclonal increase in feree Kappa and/or free Lambda light chains.  . Chronic systolic heart failure   . Depression   . GERD (gastroesophageal reflux disease)   . Portal vein thrombosis   . Avascular necrosis     right hip s/p replacement  . Gastritis   . Alcohol abuse   . Erectile dysfunction   . Bipolar disorder   . Hydrocele, unspecified   . Intermittent claudication    . Edema   . Lung nodule     Chest CT scan on 12/14/2008 showed a nodular opacity at the left lung base felt to most likely represent scarring.  Follow-up chest CT scan on 06/02/2010 showed parenchymal scarring in the left apex, left  lower lobe, and lingula; some of this scarring at the left base had a nodular appearance, unchanged.  . 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  Thyroid scan 08/28/2009 was normal with no focal areas of abnormal increased or decreased activity seen; the uptake of I 131 sodium iodide at 24 hours was 5.7%.  TSH and free T4 normalized by 08/16/2009.  [name] Pneumonia   . Intestinal obstruction   . Chronic systolic heart failure   . Other primary cardiomyopathies     2D echo on 02/06/2010 showed normal LV size with mild global hypokinesis, estimated EF 45%; mild biatrial enlargement; mild pulmonary hypertension (PA systolic pressure 42 mmHg assuming RA pressure 10 mmHg); normal RV size with mild systolic dysfunction.  [name] COPD (chronic obstructive pulmonary disease)   . Clostridium difficile colitis   . E coli bacteremia   . Cluster headaches   . DVT (deep venous thrombosis)     He has hypercoagulability with multiple occluded venous vessels  . Pleural plaque     Chest CT on 06/02/2010 showed stable extensive calcified pleural plaques involving the left hemithorax, consistent with asbestos related pleural disease.  . Weight loss     Normal colonoscopy  by Dr. [name] on 11/06/2010.  . Atrial fibrillation with rapid ventricular response   . 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 Atrial fibrillation     Paroxysmal, failed medical therapy with amiodarone but has done very well with multaq  . Anginal pain     "every once in awhile now" (03/17/2012)  . Pacemaker   . Shortness of breath     "all the time" (03/17/2012)  . Osteoarthritis   . Spondylosis   . Chronic lower back pain     "cause of my hip"  (03/17/2012)    Past Surgical History Past Surgical History  Procedure Laterality Date  . Left hip bipolar hemiarthroplasty  09/09/2002    By Dr. Kristeen Miss  . Insert / replace / remove pacemaker  02/2003    By Dr. Charolette Child, gen change by Fawn Kirk  . Total hip arthroplasty  03/08/2008    right; By Dr. Charlesetta Shanks    Allergies/Intolerances Allergies  Allergen Reactions  . Penicillins Rash   Current Home Medications Current Outpatient Prescriptions  Medication Sig Dispense Refill  . albuterol (PROAIR HFA) 108 (90 BASE) MCG/ACT inhaler Inhale 2 puffs into the lungs every 6 (six) hours as needed for wheezing or shortness of breath.  1 Inhaler  6  . buPROPion (WELLBUTRIN) 75 MG tablet Take 1 tablet (75 mg total) by mouth every morning.      . diltiazem (CARDIZEM CD) 240 MG 24 hr capsule Take 240 mg by mouth every morning.      . dronedarone (MULTAQ) 400 MG tablet Take 400 mg by mouth every morning.      . Ensure Plus (ENSURE PLUS) LIQD Take 237 mLs by mouth 2 (two) times daily between meals.  60 Can  5  . multivitamin (THERAGRAN) per tablet Take 1 tablet by mouth daily.  120 tablet  2  . omeprazole (PRILOSEC) 20 MG capsule TAKE 2 CAPSULES (40 MG TOTAL) BY MOUTH DAILY.  60 capsule  6  . oxyCODONE-acetaminophen (PERCOCET) 10-325 MG per tablet Take 1 tablet by mouth every 6 (six) hours as needed for pain.  120 tablet  0  . tiotropium (SPIRIVA) 18 MCG inhalation capsule Place 1 capsule (18 mcg total) into inhaler and inhale daily.  30 capsule  0  . warfarin (COUMADIN) 5 MG tablet Take as directed.  60 tablet  1   No current facility-administered medications for this visit.   Social History Social History  . Marital Status: Widowed   Social History Main Topics  . Smoking status: Current Every Day Smoker -- 0.50 packs/day for 40 years    Types: Cigarettes  . Smokeless tobacco: Never Used     Comment: encouraged to quit today, not ready to quit  . Alcohol Use: No     Comment:  03/17/2012 "don't drink alcohol now; used to; never had problems w/it"  . Drug Use: 1.00 per week    Special: Marijuana     Comment: 03/17/2012 "last marijuana was last week"   Social History Narrative   Works as a Media planner part time    Review of Systems General: No chills, fever, night sweats or weight changes Cardiovascular: No chest pain, dyspnea on exertion, edema, orthopnea, palpitations, paroxysmal nocturnal dyspnea Dermatological: No rash, lesions or masses Respiratory: No cough, dyspnea Urologic: No hematuria, dysuria Abdominal: No nausea, vomiting, diarrhea, bright red blood per rectum, melena, or hematemesis Neurologic: No visual changes, weakness, changes in mental status All other systems  reviewed and are otherwise negative except as noted above.  Physical Exam Blood pressure 148/84, pulse 49, height 6\' 3"  (1.905 m), weight 151 lb (68.493 kg), SpO2 99.00%.  General: Well developed, well appearing 67 year old male in no acute distress. HEENT: Normocephalic, atraumatic. EOMs intact. Sclera nonicteric. Oropharynx clear.  Neck: Supple without bruits. No JVD. Lungs: Respirations regular and unlabored, CTA bilaterally. No wheezes, rales or rhonchi. Heart: RRR. S1, S2 present. No murmurs, rub, S3 or S4. Abdomen: Soft, non-distended.  Extremities: No clubbing, cyanosis or edema. PT/Radials 2+ and equal bilaterally. Psych: Normal affect. Neuro: Alert and oriented X 3. Moves all extremities spontaneously.   Diagnostics Device interrogation today - normal VVI PPM function with good battery status and stable lead measurements/parameters; histograms appropriate; no programming changes made; see PaceArt report  Assessment and Plan 1. Tachy-brady syndrome s/p PPM implant Normal device function No programming changes made Return for device clinic visit in 6 months Return for follow-up with Dr. Johney Frame in one year 2. Atrial fibrillation Stable; no changes made today Continue  Multaq as previously directed by Dr. Johney Frame Continue diltiazem for rate control and warfarin for embolic prophylaxis 3. NICM with chronic systolic HF Stable; euvolemic by exam Continue medical therapy 4. HTN Mr. Schoeneck reports he has not taken any meds this AM  Continue current regimen  Signed, Mansour Balboa, PA-C 09/15/2012, 11:02 AM

## 2012-09-15 NOTE — Patient Instructions (Addendum)
Your physician recommends that you schedule a follow-up appointment in: 6 months with Device Clinic  Your physician wants you to follow-up in: 1 year with Dr Johney Frame Bonita Quin will receive a reminder letter in the mail two months in advance. If you don't receive a letter, please call our office to schedule the follow-up appointment.

## 2012-09-24 ENCOUNTER — Other Ambulatory Visit: Payer: Self-pay | Admitting: *Deleted

## 2012-09-24 MED ORDER — OXYCODONE-ACETAMINOPHEN 10-325 MG PO TABS: 1.0000 | ORAL_TABLET | Freq: Four times a day (QID) | ORAL | Status: DC | PRN

## 2012-09-28 ENCOUNTER — Telehealth: Payer: Self-pay | Admitting: Internal Medicine

## 2012-09-28 NOTE — Telephone Encounter (Signed)
Pt having fatigue while on multaq, has questions re continuing or not, pls call [phone]

## 2012-09-28 NOTE — Telephone Encounter (Signed)
Spoke with patient who c/o fatigue and he believes this is r/t Multaq.  Patient states he is taking Multaq 400 mg BID.  I reviewed patient's chart and instructed him that he is supposed to take Multaq only one time per day.  Patient verbalized understanding and was told to call us back if fatigue continues once he takes prescribed dose for a few days.

## 2012-10-12 ENCOUNTER — Observation Stay (HOSPITAL_COMMUNITY)
Admission: EM | Admit: 2012-10-12 | Discharge: 2012-10-14 | Disposition: A | Discharge status: Home / Self Care | Payer: Medicare Other | Attending: Internal Medicine | Admitting: Internal Medicine

## 2012-10-12 ENCOUNTER — Emergency Department (HOSPITAL_COMMUNITY): Payer: Medicare Other

## 2012-10-12 ENCOUNTER — Encounter (HOSPITAL_COMMUNITY): Payer: Self-pay | Admitting: *Deleted

## 2012-10-12 DIAGNOSIS — R634 Abnormal weight loss: Secondary | ICD-10-CM | POA: Insufficient documentation

## 2012-10-12 DIAGNOSIS — F339 Major depressive disorder, recurrent, unspecified: Secondary | ICD-10-CM | POA: Diagnosis present

## 2012-10-12 DIAGNOSIS — N179 Acute kidney failure, unspecified: Secondary | ICD-10-CM | POA: Insufficient documentation

## 2012-10-12 DIAGNOSIS — I10 Essential (primary) hypertension: Secondary | ICD-10-CM | POA: Diagnosis present

## 2012-10-12 DIAGNOSIS — I480 Paroxysmal atrial fibrillation: Secondary | ICD-10-CM | POA: Diagnosis present

## 2012-10-12 DIAGNOSIS — M25552 Pain in left hip: Secondary | ICD-10-CM | POA: Diagnosis present

## 2012-10-12 DIAGNOSIS — Z87891 Personal history of nicotine dependence: Secondary | ICD-10-CM | POA: Diagnosis present

## 2012-10-12 DIAGNOSIS — J4489 Other specified chronic obstructive pulmonary disease: Secondary | ICD-10-CM | POA: Insufficient documentation

## 2012-10-12 DIAGNOSIS — F17200 Nicotine dependence, unspecified, uncomplicated: Secondary | ICD-10-CM | POA: Insufficient documentation

## 2012-10-12 DIAGNOSIS — Z79899 Other long term (current) drug therapy: Secondary | ICD-10-CM | POA: Insufficient documentation

## 2012-10-12 DIAGNOSIS — Z950 Presence of cardiac pacemaker: Secondary | ICD-10-CM | POA: Insufficient documentation

## 2012-10-12 DIAGNOSIS — R55 Syncope and collapse: Principal | ICD-10-CM | POA: Insufficient documentation

## 2012-10-12 DIAGNOSIS — N189 Chronic kidney disease, unspecified: Secondary | ICD-10-CM | POA: Insufficient documentation

## 2012-10-12 DIAGNOSIS — J441 Chronic obstructive pulmonary disease with (acute) exacerbation: Secondary | ICD-10-CM | POA: Diagnosis present

## 2012-10-12 DIAGNOSIS — Z8639 Personal history of other endocrine, nutritional and metabolic disease: Secondary | ICD-10-CM | POA: Diagnosis present

## 2012-10-12 DIAGNOSIS — D696 Thrombocytopenia, unspecified: Secondary | ICD-10-CM | POA: Insufficient documentation

## 2012-10-12 DIAGNOSIS — S20229A Contusion of unspecified back wall of thorax, initial encounter: Secondary | ICD-10-CM | POA: Insufficient documentation

## 2012-10-12 DIAGNOSIS — K219 Gastro-esophageal reflux disease without esophagitis: Secondary | ICD-10-CM | POA: Diagnosis present

## 2012-10-12 DIAGNOSIS — R269 Unspecified abnormalities of gait and mobility: Secondary | ICD-10-CM | POA: Insufficient documentation

## 2012-10-12 DIAGNOSIS — E875 Hyperkalemia: Secondary | ICD-10-CM | POA: Insufficient documentation

## 2012-10-12 DIAGNOSIS — Y93E1 Activity, personal bathing and showering: Secondary | ICD-10-CM | POA: Insufficient documentation

## 2012-10-12 DIAGNOSIS — W19XXXA Unspecified fall, initial encounter: Secondary | ICD-10-CM | POA: Insufficient documentation

## 2012-10-12 DIAGNOSIS — I5042 Chronic combined systolic (congestive) and diastolic (congestive) heart failure: Secondary | ICD-10-CM | POA: Diagnosis present

## 2012-10-12 DIAGNOSIS — J449 Chronic obstructive pulmonary disease, unspecified: Secondary | ICD-10-CM | POA: Insufficient documentation

## 2012-10-12 DIAGNOSIS — Z7901 Long term (current) use of anticoagulants: Secondary | ICD-10-CM | POA: Insufficient documentation

## 2012-10-12 DIAGNOSIS — I4891 Unspecified atrial fibrillation: Secondary | ICD-10-CM | POA: Insufficient documentation

## 2012-10-12 DIAGNOSIS — R42 Dizziness and giddiness: Secondary | ICD-10-CM | POA: Insufficient documentation

## 2012-10-12 DIAGNOSIS — Z681 Body mass index (BMI) 19 or less, adult: Secondary | ICD-10-CM | POA: Insufficient documentation

## 2012-10-12 DIAGNOSIS — Y92009 Unspecified place in unspecified non-institutional (private) residence as the place of occurrence of the external cause: Secondary | ICD-10-CM | POA: Insufficient documentation

## 2012-10-12 DIAGNOSIS — I129 Hypertensive chronic kidney disease with stage 1 through stage 4 chronic kidney disease, or unspecified chronic kidney disease: Secondary | ICD-10-CM | POA: Insufficient documentation

## 2012-10-12 LAB — BASIC METABOLIC PANEL
CO2: 24 mEq/L (ref 19–32)
Calcium: 9.5 mg/dL (ref 8.4–10.5)
Creatinine, Ser: 2.5 mg/dL — ABNORMAL HIGH (ref 0.50–1.35)
Glucose, Bld: 89 mg/dL (ref 70–99)

## 2012-10-12 LAB — CBC WITH DIFFERENTIAL/PLATELET
Basophils Absolute: 0 10*3/uL (ref 0.0–0.1)
Eosinophils Relative: 2 % (ref 0–5)
HCT: 45.2 % (ref 39.0–52.0)
Lymphocytes Relative: 26 % (ref 12–46)
Lymphs Abs: 2.2 10*3/uL (ref 0.7–4.0)
MCH: 32.3 pg (ref 26.0–34.0)
MCV: 90.8 fL (ref 78.0–100.0)
Monocytes Absolute: 0.6 10*3/uL (ref 0.1–1.0)
RDW: 14.3 % (ref 11.5–15.5)
WBC: 8.5 10*3/uL (ref 4.0–10.5)

## 2012-10-12 LAB — HEPATIC FUNCTION PANEL
ALT: 9 U/L (ref 0–53)
Albumin: 3.7 g/dL (ref 3.5–5.2)
Alkaline Phosphatase: 66 U/L (ref 39–117)
Total Bilirubin: 0.4 mg/dL (ref 0.3–1.2)
Total Protein: 7.1 g/dL (ref 6.0–8.3)

## 2012-10-12 LAB — RAPID URINE DRUG SCREEN, HOSP PERFORMED
Barbiturates: NOT DETECTED
Benzodiazepines: NOT DETECTED
Cocaine: NOT DETECTED
Tetrahydrocannabinol: POSITIVE — AB

## 2012-10-12 LAB — TROPONIN I: Troponin I: <0.3 ng/mL (ref <0.30)

## 2012-10-12 LAB — URINALYSIS, ROUTINE W REFLEX MICROSCOPIC
Glucose, UA: NEGATIVE mg/dL
Hgb urine dipstick: NEGATIVE
Protein, ur: NEGATIVE mg/dL

## 2012-10-12 LAB — URINE MICROSCOPIC-ADD ON

## 2012-10-12 LAB — SODIUM, URINE, RANDOM: Sodium, Ur: 89 mEq/L

## 2012-10-12 MED ORDER — ACETAMINOPHEN 325 MG PO TABS: 650.0000 mg | ORAL_TABLET | Freq: Four times a day (QID) | ORAL | Status: DC | PRN

## 2012-10-12 MED ORDER — SODIUM CHLORIDE 0.9 % IJ SOLN
3.0000 mL | Freq: Two times a day (BID) | INTRAMUSCULAR | Status: DC
  Administered 2012-10-13 – 2012-10-14 (×3): 3 mL via INTRAVENOUS

## 2012-10-12 MED ORDER — ALBUTEROL SULFATE HFA 108 (90 BASE) MCG/ACT IN AERS: 2.0000 | INHALATION_SPRAY | Freq: Four times a day (QID) | RESPIRATORY_TRACT | Status: DC | PRN

## 2012-10-12 MED ORDER — INSULIN ASPART 100 UNIT/ML ~~LOC~~ SOLN: 10.0000 [IU] | Freq: Once | SUBCUTANEOUS | Status: DC

## 2012-10-12 MED ORDER — BUPROPION HCL 75 MG PO TABS
75.0000 mg | ORAL_TABLET | Freq: Every morning | ORAL | Status: DC
  Administered 2012-10-13 – 2012-10-14 (×2): 75 mg via ORAL
  Filled 2012-10-12 (×2): qty 1

## 2012-10-12 MED ORDER — DILTIAZEM HCL ER COATED BEADS 240 MG PO CP24
240.0000 mg | ORAL_CAPSULE | Freq: Every morning | ORAL | Status: DC
  Administered 2012-10-13 – 2012-10-14 (×2): 240 mg via ORAL
  Filled 2012-10-12 (×2): qty 1

## 2012-10-12 MED ORDER — OXYCODONE-ACETAMINOPHEN 10-325 MG PO TABS: 1.0000 | ORAL_TABLET | Freq: Four times a day (QID) | ORAL | Status: DC | PRN

## 2012-10-12 MED ORDER — MORPHINE SULFATE 4 MG/ML IJ SOLN
4.0000 mg | Freq: Once | INTRAMUSCULAR | Status: AC
  Administered 2012-10-12: 4 mg via INTRAVENOUS
  Filled 2012-10-12: qty 1

## 2012-10-12 MED ORDER — TIOTROPIUM BROMIDE MONOHYDRATE 18 MCG IN CAPS
18.0000 ug | ORAL_CAPSULE | Freq: Every day | RESPIRATORY_TRACT | Status: DC
  Administered 2012-10-13 – 2012-10-14 (×2): 18 ug via RESPIRATORY_TRACT
  Filled 2012-10-12 (×2): qty 5

## 2012-10-12 MED ORDER — HEPARIN SODIUM (PORCINE) 5000 UNIT/ML IJ SOLN: 5000.0000 [IU] | Freq: Three times a day (TID) | INTRAMUSCULAR | Status: DC

## 2012-10-12 MED ORDER — SODIUM CHLORIDE 0.9 % IV SOLN: 250.0000 mL | INTRAVENOUS | Status: DC | PRN

## 2012-10-12 MED ORDER — DRONEDARONE HCL 400 MG PO TABS
400.0000 mg | ORAL_TABLET | Freq: Every morning | ORAL | Status: DC
  Administered 2012-10-13 – 2012-10-14 (×2): 400 mg via ORAL
  Filled 2012-10-12 (×2): qty 1

## 2012-10-12 MED ORDER — PANTOPRAZOLE SODIUM 40 MG PO TBEC
40.0000 mg | DELAYED_RELEASE_TABLET | Freq: Every day | ORAL | Status: DC
  Administered 2012-10-12 – 2012-10-14 (×3): 40 mg via ORAL
  Filled 2012-10-12 (×3): qty 1

## 2012-10-12 MED ORDER — ONDANSETRON HCL 4 MG/2ML IJ SOLN
4.0000 mg | Freq: Three times a day (TID) | INTRAMUSCULAR | Status: DC | PRN
  Administered 2012-10-12: 4 mg via INTRAVENOUS
  Filled 2012-10-12: qty 2

## 2012-10-12 MED ORDER — ENSURE PLUS PO LIQD
237.0000 mL | Freq: Two times a day (BID) | ORAL | Status: DC
  Administered 2012-10-12 – 2012-10-13 (×2): 237 mL via ORAL
  Filled 2012-10-12 (×7): qty 237

## 2012-10-12 MED ORDER — OXYCODONE-ACETAMINOPHEN 5-325 MG PO TABS
1.0000 | ORAL_TABLET | Freq: Four times a day (QID) | ORAL | Status: DC | PRN
  Administered 2012-10-13 – 2012-10-14 (×3): 1 via ORAL
  Filled 2012-10-12 (×3): qty 1

## 2012-10-12 MED ORDER — ONDANSETRON HCL 4 MG/2ML IJ SOLN
4.0000 mg | Freq: Once | INTRAMUSCULAR | Status: AC
  Administered 2012-10-12: 4 mg via INTRAVENOUS
  Filled 2012-10-12: qty 2

## 2012-10-12 MED ORDER — OXYCODONE HCL 5 MG PO TABS: 5.0000 mg | ORAL_TABLET | Freq: Four times a day (QID) | ORAL | Status: DC | PRN

## 2012-10-12 MED ORDER — DEXTROSE 50 % IV SOLN: 1.0000 | INTRAVENOUS | Status: DC

## 2012-10-12 MED ORDER — ACETAMINOPHEN 650 MG RE SUPP: 650.0000 mg | Freq: Four times a day (QID) | RECTAL | Status: DC | PRN

## 2012-10-12 MED ORDER — INSULIN REGULAR BOLUS VIA INFUSION: 10.0000 [IU] | INTRAVENOUS | Status: DC

## 2012-10-12 MED ORDER — SODIUM POLYSTYRENE SULFONATE 15 GM/60ML PO SUSP
30.0000 g | Freq: Once | ORAL | Status: AC
  Administered 2012-10-12: 30 g via ORAL
  Filled 2012-10-12 (×2): qty 120

## 2012-10-12 MED ORDER — SODIUM CHLORIDE 0.9 % IJ SOLN: 3.0000 mL | Freq: Two times a day (BID) | INTRAMUSCULAR | Status: DC

## 2012-10-12 MED ORDER — SODIUM CHLORIDE 0.9 % IJ SOLN: 3.0000 mL | INTRAMUSCULAR | Status: DC | PRN

## 2012-10-12 NOTE — H&P (Signed)
Hospital Admission Note Date: 10/12/2012  Patient name: Travis MOTTERN Sr. Medical record number: [id number] Date of birth: [birth year] Age: 67 y.o. Gender: male PCP: [name], MD Cardiologist: Dr. Tillie Rung   Medical Service: Internal Medicine Attending physician: Dr. Rogelia Boga   1st Contact: Dr. Shirlee Latch Pager: [phone] 2nd Contact: Dr. Dierdre Searles Pager:[phone] After 5 pm or weekends: 1st Contact: Pager: [phone] 2nd Contact: Pager: [phone]  Chief Complaint: Lightheadedness, Fall   History of Present Illness: 67 y.o male s/p pacemaker, atrial fibrillation on Coumadin, history of tachycardia-bradycardia syndrome presents for 1 week of positional lightheadedness associated with fall the day prior to admission.  At baseline he walks with cane.  He was in the shower Sunday night 9:30-10 PM when he fell in the bathroom hit the back of his right head, right side of back, and left hip.  Hip pain was 7.5-8/10 after morphine 4 mg x 2 given in the ED. Hip pain was worse with movement.  He denies loss of consciousness, vertigo, hearing loss, tinnitis.  He has been having palpitations, piercing pain in left chest radiating to left back.  Of note chest pain is chronic and intermittent for years; most recently experienced chest pain Friday rates 9/10, Saturday rate 8/10, Monday rates 7/10.  Todays episode of chest pain was around 3:15 PM; he takes Oxycodone which helps normally, chest pain is worse with movement but not with breathing.  He denies shortness of breath.   Meds: Current Facility-Administered Medications  Medication Dose Route Frequency Provider Last Rate Last Dose  . 0.9 %  sodium chloride infusion  250 mL Intravenous PRN Annett Gula, MD      . albuterol (PROVENTIL HFA;VENTOLIN HFA) 108 (90 BASE) MCG/ACT inhaler 2 puff  2 puff Inhalation Q6H PRN Annett Gula, MD      . Melene Muller ON 10/13/2012] buPROPion [facility]) tablet 75 mg  75 mg Oral q morning - 10a Annett Gula, MD      . Melene Muller ON  10/13/2012] diltiazem (CARDIZEM CD) 24 hr capsule 240 mg  240 mg Oral q morning - 10a Annett Gula, MD      . Melene Muller ON 10/13/2012] dronedarone (MULTAQ) tablet 400 mg  400 mg Oral q morning - 10a Annett Gula, MD      . Ensure Plus (ENSURE PLUS) liquid 237 mL  237 mL Oral BID BM Annett Gula, MD   237 mL at 10/12/12 1613  . oxyCODONE-acetaminophen (PERCOCET/ROXICET) 5-325 MG per tablet 1 tablet  1 tablet Oral Q6H PRN Dione Booze, MD      . pantoprazole (PROTONIX) EC tablet 40 mg  40 mg Oral Daily Annett Gula, MD   40 mg at 10/12/12 1613  . sodium chloride 0.9 % injection 3 mL  3 mL Intravenous Q12H Annett Gula, MD      . sodium chloride 0.9 % injection 3 mL  3 mL Intravenous Q12H Annett Gula, MD      . sodium chloride 0.9 % injection 3 mL  3 mL Intravenous PRN Annett Gula, MD      . Melene Muller ON 10/13/2012] tiotropium (SPIRIVA) inhalation capsule 18 mcg  18 mcg Inhalation Daily Annett Gula, MD       Multivitamin  Allergies: Allergies as of 10/12/2012 - Review Complete 10/12/2012  Allergen Reaction Noted  . Penicillins Rash    Past Medical History  Diagnosis Date  . Hypertension   . Renal insufficiency     Renal U/S  12/04/2009 showed no pathological findings. Labs 12/04/2009 include normal ESR, C3, C4; neg ANA; SPEP showed nonspecific increase in the alpha-2 region with no M-spike; UPEP showed no monoclonal free light chains; urine IFE showed polyclonal increase in feree Kappa and/or free Lambda light chains.  . Chronic systolic heart failure   . Depression   . GERD (gastroesophageal reflux disease)   . Portal vein thrombosis   . Avascular necrosis     right hip s/p replacement  . Gastritis   . Alcohol abuse   . Erectile dysfunction   . Bipolar disorder   . Hydrocele, unspecified   . Intermittent claudication   . Edema   . Lung nodule     Chest CT scan on 12/14/2008 showed a nodular opacity at the left lung base felt to most likely represent scarring.  Follow-up chest  CT scan on 06/02/2010 showed parenchymal scarring in the left apex, left  lower lobe, and lingula; some of this scarring at the left base had a nodular appearance, unchanged.  . 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  Thyroid scan 08/28/2009 was normal with no focal areas of abnormal increased or decreased activity seen; the uptake of I 131 sodium iodide at 24 hours was 5.7%.  TSH and free T4 normalized by 08/16/2009.  [name] Pneumonia   . Intestinal obstruction   . Chronic systolic heart failure   . Other primary cardiomyopathies     2D echo on 02/06/2010 showed normal LV size with mild global hypokinesis, estimated EF 45%; mild biatrial enlargement; mild pulmonary hypertension (PA systolic pressure 42 mmHg assuming RA pressure 10 mmHg); normal RV size with mild systolic dysfunction.  [name] COPD (chronic obstructive pulmonary disease)   . Clostridium difficile colitis   . E coli bacteremia   . Cluster headaches   . DVT (deep venous thrombosis)     He has hypercoagulability with multiple occluded venous vessels  . Pleural plaque     Chest CT on 06/02/2010 showed stable extensive calcified pleural plaques involving the left hemithorax, consistent with asbestos related pleural disease.  . Weight loss     Normal colonoscopy by Dr. [name] on 11/06/2010.  . Atrial fibrillation with rapid ventricular response   . 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 Atrial fibrillation     Paroxysmal, failed medical therapy with amiodarone but has done very well with multaq  . Anginal pain     "every once in awhile now" (03/17/2012)  . Pacemaker   . Shortness of breath     "all the time" (03/17/2012)  . Osteoarthritis   . Spondylosis   . Chronic lower back pain     "cause of my hip" (03/17/2012)   Past Surgical History  Procedure Laterality Date  . Left hip bipolar hemiarthroplasty  09/09/2002    By Dr. Kristeen Miss  . Insert / replace / remove  pacemaker  02/2003    By Dr. Charolette Child, gen change by Fawn Kirk  . Total hip arthroplasty  03/08/2008    right; By Dr. Charlesetta Shanks   Family History  Problem Relation Age of Onset  . Hypertension Mother   . Cancer Brother     Unsure of type.  . Asthma Mother   . Coronary artery disease Mother   . Colon cancer Neg Hx   . Lung cancer Neg Hx   . Prostate cancer Neg Hx    History   Social History  .  Marital Status: Widowed    Spouse Name: N/A    Number of Children: 10  . Years of Education: N/A   Occupational History  . RETIRED    Social History Main Topics  . Smoking status: Current Every Day Smoker -- 0.50 packs/day for 50 years    Types: Cigarettes  . Smokeless tobacco: Never Used     Comment: encouraged to quit today, not ready to quit  . Alcohol Use: No     Comment: 03/17/2012 "don't drink alcohol now; used to; never had problems w/it"  . Drug Use: 1.00 per week    Special: Marijuana     Comment: 03/17/2012 "last marijuana was last week"  . Sexually Active: Yes   Other Topics Concern  . Not on file   Social History Narrative   Works as a Media planner part time   Smoker 1 pack last 1.5 days tobacco, smokes marijuana    Denies EtOH x 4 years (on 10/12/12)   12 kids    From [place]   [place] Veteran    12 grade education              Review of Systems: General: +subjective fever and chills Friday and Saturday prior to admission, +decreased energy, +decreased appetite x >2 years, denies sick contacts, +weight loss (per patient 210-->150 lbs in EPIC patient was at most 205 lbs in 10/2007 now wt 150s-160s since 05/2010)  HEENT: denies sore throat, dysphagia, hearing loss, tinnitis Cardiac: +palpitations, +piercing pain left chest to left back (chest pain chronic and intermittent for years; most recently experienced Friday 9/10, Saturday 8/10, Monday 7/10.  Episode of chest pain today was around 3:15 PM; he takes Oxycodone which helps normally, pain is worse with  movement but not with breathing Pulm: denies sob, +chronic cough with white/browin phlegm x 6 months at least  Abd/GU: +nauseated Saturday prior to admission and spiting phlegm; denies vomiting, denies diarrhea; denies blood in urine/stool Ext: +chronic leg swelling  Neuro: +lightheadedness x 1 week, +positional lightheadedness, +baseline walks with cane, +fall Sunday night 9:30-10 PM in the bathroom hit his occipital right head, right side of back, left hip.  Hip pain was 7.5-8/10 after morphine; hip pain worse with movement, Denies loss of consciousness, denies vertigo Psych: +depression (triggers life stressors)   Physical Exam: Blood pressure 128/76, pulse 82, temperature 98.3 F (36.8 C), temperature source Oral, resp. rate 20, weight 151 lb 0.2 oz (68.5 kg), SpO2 98.00%. Vitals reviewed on exam: HR 59/18/128/83 (90)  General: resting in bed, NAD, alert and oriented x 3  HEENT: [place]/at, wet oral mucosa, no teeth Cardiac: RRR, no rubs, murmurs or gallops Pulm: clear to auscultation bilaterally, no wheezes, rales, or rhonchi Abd: soft, mild epigastric ttp, nondistended, hypoactive BS present Ext: warm and well perfused, no pedal edema Neuro: alert and oriented X3, grossly neurologically intact, moving all 4 extremities   Lab results: Basic Metabolic Panel:  Recent Labs  16/10/96 1215 10/12/12 1552  NA 139  --   K 5.7*  --   CL 105  --   CO2 24  --   GLUCOSE 89  --   BUN 44*  --   CREATININE 2.50*  --   CALCIUM 9.5  --   MG  --  2.1   Liver Function Tests:  Recent Labs  10/12/12 1552  AST 19  ALT 9  ALKPHOS 66  BILITOT 0.4  PROT 7.1  ALBUMIN 3.7   CBC:  Recent Labs  10/12/12 1215  WBC 8.5  NEUTROABS 5.6  HGB 16.1  HCT 45.2  MCV 90.8  PLT 128*   Cardiac Enzymes:  Recent Labs  10/12/12 1215 10/12/12 1817  TROPONINI <0.30 <0.30   Coagulation:  Recent Labs  10/12/12 1215  LABPROT 20.3*  INR 1.81*   Urine Drug Screen: Drugs of Abuse      Component Value Date/Time   LABOPIA POSITIVE* 02/06/2010 0248   LABOPIA NEGATIVE 12/27/2008 1943   COCAINSCRNUR NONE DETECTED 02/06/2010 0248   COCAINSCRNUR NEG 07/06/2009 2225   LABBENZ NONE DETECTED 02/06/2010 0248   LABBENZ NEG 07/06/2009 2225   AMPHETMU NONE DETECTED 02/06/2010 0248   AMPHETMU NEG 07/06/2009 2225   THCU POSITIVE* 02/06/2010 0248   LABBARB  Value: NONE DETECTED        DRUG SCREEN FOR MEDICAL PURPOSES ONLY.  IF CONFIRMATION IS NEEDED FOR ANY PURPOSE, NOTIFY LAB WITHIN 5 DAYS.        LOWEST DETECTABLE LIMITS FOR URINE DRUG SCREEN Drug Class       Cutoff (ng/mL) Amphetamine      1000 Barbiturate      200 Benzodiazepine   200 Tricyclics       300 Opiates          300 Cocaine          300 THC              50 02/06/2010 0248    Alcohol Level:  Recent Labs  10/12/12 1552  ETH <11   Urinalysis: No results found for this basename: COLORURINE, APPERANCEUR, LABSPEC, PHURINE, GLUCOSEU, HGBUR, BILIRUBINUR, KETONESUR, PROTEINUR, UROBILINOGEN, NITRITE, LEUKOCYTESUR,  in the last 72 hours Misc. Labs: Trop x 2 UDS UA  Urine lytes   Imaging results:  Ct Abdomen Pelvis Wo Contrast  10/12/2012   *RADIOLOGY REPORT*  Clinical Data: The patient became dizzy and fell in bathroom.  No head or neck pain.  Hit right side of chest and abdomen.  Recent weight loss.  Elevated creatinine.  CT ABDOMEN AND PELVIS WITHOUT CONTRAST  Technique:  Multidetector CT imaging of the abdomen and pelvis was performed following the standard protocol without intravenous contrast.  Comparison: CT of the chest, abdomen, and pelvis 01/14/2012and earlier  Findings: Pleural based calcifications are noted at the left lung base, associated pleural thickening and rounded atelectasis.  A small cavitary lesion is identified at the right lung base, 8 mm in diameter and stable in appearance since previous exams to 2010.  The patient has a transvenous pacemaker.  No focal abnormality identified within the liver, spleen, pancreas, or  adrenal glands. No focal renal lesion or hydronephrosis.  The gallbladder is present.  The stomach and small bowel loops are normal in appearance. Colonic loops show moderate stool burden, especially within the rectosigmoid colon where there is fecal impaction.  The appendix is present and measures upper limits normal in thickness.  No periappendiceal stranding or fluid identified.  No evidence for aortic aneurysm. No retroperitoneal or mesenteric adenopathy.  There is grade 1 retrolisthesis of L3 on L4, likely degenerative. There is associated significant disc height loss at the same level. There is grade 1 anterolisthesis of L5 on S1, also likely degenerative and related to bilateral pars defects at L5.  No evidence for acute fracture.  The patient has had previous bilateral hip arthroplasty.  IMPRESSION:  1.  Pleural calcifications and rounded atelectasis at the left lung base. 2.  Small cavitary lesion at the right lower lobe, stable since  2010 and consistent with benign process. 3.  Pacemaker. 4.  No evidence for acute injury. 5.  Significant stool burden with rectosigmoid fecal impaction. 6.  Spondylolisthesis as described with bilateral pars defects at L5, chronic.   Original Report Authenticated By: Norva Pavlov, M.D.   Dg Pelvis 1-2 Views  10/12/2012   *RADIOLOGY REPORT*  Clinical Data: Fall.  Pain along the lateral aspects of the left hip.  PELVIS - 1-2 VIEW  Comparison: CT of the abdomen pelvis 10/12/2012, 06/02/2010  Findings: The patient has had bilateral hip arthroplasty.  There is significant heterotopic bone bilaterally.  No acute fracture or subluxation identified.  There is significant stool within the rectosigmoid colon.  IMPRESSION:  1.  Postoperative changes. 2. No evidence for acute  abnormality.   Original Report Authenticated By: Norva Pavlov, M.D.   Dg Femur Left  10/12/2012   *RADIOLOGY REPORT*  Clinical Data: Pain along the lateral aspect of the left hip. Fall.  LEFT FEMUR - 2  VIEW  Comparison: 04/25/2004  Findings: Patient has had left hip arthroplasty.  There is heterotopic bone lateral to the hip.  No evidence for acute fracture or subluxation.  IMPRESSION: No evidence for acute  abnormality.   Original Report Authenticated By: Norva Pavlov, M.D.   Ct Head Wo Contrast  10/12/2012   *RADIOLOGY REPORT*  Clinical Data:  Dizziness, fall  CT HEAD WITHOUT CONTRAST CT CERVICAL SPINE WITHOUT CONTRAST  Technique:  Multidetector CT imaging of the head and cervical spine was performed following the standard protocol without intravenous contrast.  Multiplanar CT image reconstructions of the cervical spine were also generated.  Comparison:  09/02/2010  CT HEAD  Findings: No evidence of parenchymal hemorrhage or extra-axial fluid collection. No mass lesion, mass effect, or midline shift.  No CT evidence of acute infarction.  Mild cortical atrophy.  No ventriculomegaly.  The visualized paranasal sinuses are essentially clear. The mastoid air cells are unopacified.  No evidence of calvarial fracture.  IMPRESSION: No evidence of acute intracranial abnormality.  CT CERVICAL SPINE  Findings: Normal cervical lordosis.  No evidence of fracture or dislocation.  Vertebral body heights are maintained.  The dens appears intact.  No prevertebral soft tissue swelling.  Moderate multilevel degenerative changes.  Visualized thyroid is mildly heterogeneous.  Visualized lungs are notable for stable left apical pleural parenchymal scarring.  IMPRESSION: No evidence of traumatic injury to the cervical spine.  Moderate multilevel degenerative changes.   Original Report Authenticated By: Charline Bills, M.D.   Ct Chest Wo Contrast  10/12/2012   *RADIOLOGY REPORT*  Clinical Data:  Trauma.  CT CHEST, ABDOMEN AND PELVIS WITHOUT CONTRAST  Technique:  Multidetector CT imaging of the chest, abdomen and pelvis was performed following the standard protocol without IV contrast.  Comparison:  03/18/2012  CT CHEST   Findings:  The chest wall is unremarkable.  No supraclavicular or axillary mass or adenopathy.  The bony thorax is intact.  No definite rib, vertebral body or sternal fracture.  The heart is normal in size.  No pericardial effusion.  No mediastinal hematoma.  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  The esophagus is grossly normal.  Pacer wires are in place.  Small scattered mediastinal and hilar lymph nodes.  There are surgical changes involving the left lung with loss of volume and dense apical scarring.  Extensive pleural calcifications asymmetrically on the left side.  These are stable.  The right lung is clear of  acute process.  Small pulmonary nodules are stable. No pleural effusion.  Stable left basilar scarring.  IMPRESSION:  1.  No acute bony findings. 2.  Chronic lung changes.  No acute pulmonary findings. 3.  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  CT ABDOMEN AND PELVIS  Findings:  The solid abdominal organs are grossly normal without IV contrast.  No obvious acute injury.  The gallbladder is normal.  The stomach, duodenum, small bowel and colon grossly normal without oral contrast.  The appendix is normal.  No mesenteric or retroperitoneal mass, adenopathy or hematoma.  The aorta is normal in caliber.  Moderate atherosclerotic calcifications.  Significant artifact through the lower pelvis due to bilateral hip prosthesis.  Fairly marked fecal impaction is noted in the rectum. The bladder appears normal.  No pelvic mass or large hematoma.  No inguinal mass or adenopathy.  The pubic symphysis and SI joints are intact.  No definite pelvic fractures.  The lumbar vertebral bodies are intact.  There are bilateral pars defects at L5 with a grade 1 spondylolisthesis and advanced degenerative disc disease at L3-4.  IMPRESSION:  1.  No acute abdominal/pelvic findings without IV or oral contrast. 2.  Intact bony structures. 3.  Fecal impaction  in the rectum.   Original Report Authenticated By: Rudie Meyer, M.D.   Ct Cervical Spine Wo Contrast  10/12/2012   *RADIOLOGY REPORT*  Clinical Data:  Dizziness, fall  CT HEAD WITHOUT CONTRAST CT CERVICAL SPINE WITHOUT CONTRAST  Technique:  Multidetector CT imaging of the head and cervical spine was performed following the standard protocol without intravenous contrast.  Multiplanar CT image reconstructions of the cervical spine were also generated.  Comparison:  09/02/2010  CT HEAD  Findings: No evidence of parenchymal hemorrhage or extra-axial fluid collection. No mass lesion, mass effect, or midline shift.  No CT evidence of acute infarction.  Mild cortical atrophy.  No ventriculomegaly.  The visualized paranasal sinuses are essentially clear. The mastoid air cells are unopacified.  No evidence of calvarial fracture.  IMPRESSION: No evidence of acute intracranial abnormality.  CT CERVICAL SPINE  Findings: Normal cervical lordosis.  No evidence of fracture or dislocation.  Vertebral body heights are maintained.  The dens appears intact.  No prevertebral soft tissue swelling.  Moderate multilevel degenerative changes.  Visualized thyroid is mildly heterogeneous.  Visualized lungs are notable for stable left apical pleural parenchymal scarring.  IMPRESSION: No evidence of traumatic injury to the cervical spine.  Moderate multilevel degenerative changes.   Original Report Authenticated By: Charline Bills, M.D.    Other results: EKG: rate 54, sinus bradycardia, axis normal, normal intervals, no LVH, no acute ST changes, T wave inversion in V4 (from prior EKG 06/2012)   Assessment & Plan by Problem: 67 y.o male s/p pacemaker, atrial fibrillation on Coumadin, history of tachycardia-bradycardia syndrome presents for 1 week of positional lightheadedness associated with fall the day prior to admission.   1. Presyncope  -Etiology could be arrhythmia as patient has a history of tachycardia-bradycardia syndrome  s/p pacemaker, neurocardiogenic (i.e vasovagal) as he has had decreased oral intake due to decreased appetite, orthostatic hypotension due to medications such as Diltiazem), MI, neurologic (i.e seizure, TIA/CVA) though CT head negative, medications (he is on Wellbutrin 75 mg qpm-->dizziness, arrhythmias, Dilatiazem 240 mg qd-->bradycardia, severe hypotension, bradycardia, orthostatic hypotension, dizziness; Multaq-->bradycardia.  Patient denies alcohol and he was not hypoglycemic on admission -Will check UDS, orthostatics (lying 114/73 HR 62, sitting 114/75 HR 63, Standing 128/88 HR 83)  -  will admit to telemetry and monitor for arrhythmias   -Consider tilt table test in the future -CE negative x 3 (Already negative x 1)  -Consider f/u evaluation with cardiologist Dr. Tillie Rung.    2. Acute renal failure  -Baseline creatinine 1.3-1.7. On admission 2.50.  Etiology could be secondary to decreased oral intake/dehydration, patient also uses NSAIDS sporadically. -will trend BMET and check urine lytes   3. Status post fall  -CT head negative, Xray left femur negative, Xray pelvis negative, CT abdomen/pelvis stable changes, CT chest chronic changes -Given 4 mg morphine x 2. Resumed home Percocet  -will get PT/OT for recommendations   4. History of Atrial fibrillation On Coumadin  -INR 1.81  -Coumadin per pharmacy  -monitor via tele   5. Chronic Thrombocytopenia  -Platelets 128 on admission.  Etiology could be secondary to previous alcohol abuse though patient denies recent use    6. Weight loss  -per patient 210-->150 lbs in EPIC patient was at most 205 lbs in 10/2007 now wt 150s-160s since 05/2010). Negative HIV in 05/2010.  Patient has had decreased appetite x 2 years.  -Normal colonoscopy 10/2010   7. F/E/N -Hyperkalemia 5.7 on admission Given Kayexalate 30 g x 1. Will trend BMET -Reg Diet   8. DVT px  -SCDS -Coumadin per pharmacy    Dispo: Disposition is deferred at this time, awaiting  improvement of current medical problems. Anticipated discharge in approximately 1-2 day(s).   The patient does have a current PCP [name], MD), therefore will be requiring [facility] follow-up after discharge.   The patient does not have transportation limitations that hinder transportation to clinic appointments.  SignedAnnett Gula [phone] 10/12/2012, 6:58 PM

## 2012-10-12 NOTE — ED Provider Notes (Signed)
History     CSN: [id number]  Arrival date & time 10/12/12  1002   First MD Initiated Contact with Patient 10/12/12 1020      Chief Complaint  Patient presents with  . Fall    (Consider location/radiation/quality/duration/timing/severity/associated sxs/prior treatment) Patient is a 67 y.o. male presenting with fall. The history is provided by the patient.  Fall  He was taking a shower last night and he felt lightheaded and passed out. When he fell, he hit the back of his head. He also hit his left hip. He is complaining of pain in the back of his head, left hip, left thigh and also across the lower back. Pain is moderate to severe and he rates it at 8/10. He has a history of bilateral hip replacement surgery and he is on warfarin for a history of DVT. He denies chest pain, heaviness, tightness, pressure. He denies dyspnea. He is complaining of mild nausea but no vomiting. He denies diaphoresis. He estimates loss of consciousness at about 2 minutes.  Past Medical History  Diagnosis Date  . Hypertension   . Renal insufficiency     Renal U/S 12/04/2009 showed no pathological findings. Labs 12/04/2009 include normal ESR, C3, C4; neg ANA; SPEP showed nonspecific increase in the alpha-2 region with no M-spike; UPEP showed no monoclonal free light chains; urine IFE showed polyclonal increase in feree Kappa and/or free Lambda light chains.  . Chronic systolic heart failure   . Depression   . GERD (gastroesophageal reflux disease)   . Portal vein thrombosis   . Avascular necrosis     right hip s/p replacement  . Gastritis   . Alcohol abuse   . Erectile dysfunction   . Bipolar disorder   . Hydrocele, unspecified   . Intermittent claudication   . Edema   . Lung nodule     Chest CT scan on 12/14/2008 showed a nodular opacity at the left lung base felt to most likely represent scarring.  Follow-up chest CT scan on 06/02/2010 showed parenchymal scarring in the left apex, left  lower lobe, and  lingula; some of this scarring at the left base had a nodular appearance, unchanged.  . 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  Thyroid scan 08/28/2009 was normal with no focal areas of abnormal increased or decreased activity seen; the uptake of I 131 sodium iodide at 24 hours was 5.7%.  TSH and free T4 normalized by 08/16/2009.  [name] Pneumonia   . Intestinal obstruction   . Chronic systolic heart failure   . Other primary cardiomyopathies     2D echo on 02/06/2010 showed normal LV size with mild global hypokinesis, estimated EF 45%; mild biatrial enlargement; mild pulmonary hypertension (PA systolic pressure 42 mmHg assuming RA pressure 10 mmHg); normal RV size with mild systolic dysfunction.  [name] COPD (chronic obstructive pulmonary disease)   . Clostridium difficile colitis   . E coli bacteremia   . Cluster headaches   . DVT (deep venous thrombosis)     He has hypercoagulability with multiple occluded venous vessels  . Pleural plaque     Chest CT on 06/02/2010 showed stable extensive calcified pleural plaques involving the left hemithorax, consistent with asbestos related pleural disease.  . Weight loss     Normal colonoscopy by Dr. [name] on 11/06/2010.  . Atrial fibrillation with rapid ventricular response   . 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 Atrial fibrillation  Paroxysmal, failed medical therapy with amiodarone but has done very well with multaq  . Anginal pain     "every once in awhile now" (03/17/2012)  . Pacemaker   . Shortness of breath     "all the time" (03/17/2012)  . Osteoarthritis   . Spondylosis   . Chronic lower back pain     "cause of my hip" (03/17/2012)    Past Surgical History  Procedure Laterality Date  . Left hip bipolar hemiarthroplasty  09/09/2002    By Dr. Kristeen Miss  . Insert / replace / remove pacemaker  02/2003    By Dr. Charolette Child, gen change by Fawn Kirk  . Total hip  arthroplasty  03/08/2008    right; By Dr. Charlesetta Shanks    Family History  Problem Relation Age of Onset  . Hypertension Mother   . Cancer Brother     Unsure of type.  . Asthma Mother   . Coronary artery disease Mother   . Colon cancer Neg Hx   . Lung cancer Neg Hx   . Prostate cancer Neg Hx     History  Substance Use Topics  . Smoking status: Current Every Day Smoker -- 0.50 packs/day for 40 years    Types: Cigarettes  . Smokeless tobacco: Never Used     Comment: encouraged to quit today, not ready to quit  . Alcohol Use: No     Comment: 03/17/2012 "don't drink alcohol now; used to; never had problems w/it"      Review of Systems  All other systems reviewed and are negative.    Allergies  Penicillins  Home Medications   Current Outpatient Rx  Name  Route  Sig  Dispense  Refill  . albuterol (PROVENTIL HFA;VENTOLIN HFA) 108 (90 BASE) MCG/ACT inhaler   Inhalation   Inhale 2 puffs into the lungs every 6 (six) hours as needed for wheezing or shortness of breath.         [name] buPROPion (WELLBUTRIN) 75 MG tablet   Oral   Take 1 tablet (75 mg total) by mouth every morning.         . diltiazem (CARDIZEM CD) 240 MG 24 hr capsule   Oral   Take 240 mg by mouth every morning.         . dronedarone (MULTAQ) 400 MG tablet   Oral   Take 400 mg by mouth every morning.         . Ensure Plus (ENSURE PLUS) LIQD   Oral   Take 237 mLs by mouth 2 (two) times daily between meals.   60 Can   5   . omeprazole (PRILOSEC) 20 MG capsule   Oral   Take 20 mg by mouth 2 (two) times daily.         [name] oxyCODONE-acetaminophen (PERCOCET) 10-325 MG per tablet   Oral   Take 1 tablet by mouth every 6 (six) hours as needed for pain.   120 tablet   0   . tiotropium (SPIRIVA) 18 MCG inhalation capsule   Inhalation   Place 1 capsule (18 mcg total) into inhaler and inhale daily.   30 capsule   0   . warfarin (COUMADIN) 10 MG tablet   Oral   Take 10 mg by mouth See admin  instructions. Sun, Mon, Weds, Fri, and Sat only         . warfarin (COUMADIN) 5 MG tablet   Oral   Take 7.5 mg by mouth 2 (two)  times a week. Tues and Thurs only           BP 118/86  Pulse 62  Temp(Src) 98.6 F (37 C) (Oral)  Resp 14  SpO2 95%  Physical Exam  Nursing note and vitals reviewed.  67 year old male, resting comfortably and in no acute distress. Vital signs are normal. Oxygen saturation is 95%, which is normal. Head is normocephalic. There is tenderness palpation over the occiput without any definite cephalhematoma. Pupils are 2 mm and nonreactive, EOMI. Oropharynx is clear. Fundi are poorly seen due to constricted pupils. Neck is mildly tender posteriorly without adenopathy or JVD. Back is moderately tender throughout the lumbar and thoracic regions. There is no CVA tenderness. Lungs are clear without rales, wheezes, or rhonchi. Chest is nontender. Heart has regular rate and rhythm without murmur. Abdomen is soft, flat, with mild tenderness across the pelvic brim and over lower abdomen. There is no rebound or guarding. There are no masses or hepatosplenomegaly and peristalsis is normoactive. Extremities have no cyanosis or edema, full range of motion is present. There is tenderness to palpation over both hips-left more than right. There is also tenderness palpation rather diffusely throughout the left thigh without any deformity seen and no significant swelling. Skin is warm and dry without rash. Neurologic: Mental status is normal, cranial nerves are intact, there are no motor or sensory deficits.  ED Course  Procedures (including critical care time)  Results for orders placed during the hospital encounter of 10/12/12  CBC WITH DIFFERENTIAL      Result Value Range   WBC 8.5  4.0 - 10.5 K/uL   RBC 4.98  4.22 - 5.81 MIL/uL   Hemoglobin 16.1  13.0 - 17.0 g/dL   HCT 16.1  09.6 - 04.5 %   MCV 90.8  78.0 - 100.0 fL   MCH 32.3  26.0 - 34.0 pg   MCHC 35.6  30.0 -  36.0 g/dL   RDW 40.9  81.1 - 91.4 %   Platelets 128 (*) 150 - 400 K/uL   Neutrophils Relative % 65  43 - 77 %   Neutro Abs 5.6  1.7 - 7.7 K/uL   Lymphocytes Relative 26  12 - 46 %   Lymphs Abs 2.2  0.7 - 4.0 K/uL   Monocytes Relative 7  3 - 12 %   Monocytes Absolute 0.6  0.1 - 1.0 K/uL   Eosinophils Relative 2  0 - 5 %   Eosinophils Absolute 0.2  0.0 - 0.7 K/uL   Basophils Relative 0  0 - 1 %   Basophils Absolute 0.0  0.0 - 0.1 K/uL  BASIC METABOLIC PANEL      Result Value Range   Sodium 139  135 - 145 mEq/L   Potassium 5.7 (*) 3.5 - 5.1 mEq/L   Chloride 105  96 - 112 mEq/L   CO2 24  19 - 32 mEq/L   Glucose, Bld 89  70 - 99 mg/dL   BUN 44 (*) 6 - 23 mg/dL   Creatinine, Ser 7.82 (*) 0.50 - 1.35 mg/dL   Calcium 9.5  8.4 - 95.6 mg/dL   GFR calc non Af Amer 25 (*) >90 mL/min   GFR calc Af Amer 29 (*) >90 mL/min  TROPONIN I      Result Value Range   Troponin I <0.30  <0.30 ng/mL  PROTIME-INR      Result Value Range   Prothrombin Time 20.3 (*) 11.6 - 15.2 seconds  INR 1.81 (*) 0.00 - 1.49   Ct Abdomen Pelvis Wo Contrast  10/12/2012   *RADIOLOGY REPORT*  Clinical Data: The patient became dizzy and fell in bathroom.  No head or neck pain.  Hit right side of chest and abdomen.  Recent weight loss.  Elevated creatinine.  CT ABDOMEN AND PELVIS WITHOUT CONTRAST  Technique:  Multidetector CT imaging of the abdomen and pelvis was performed following the standard protocol without intravenous contrast.  Comparison: CT of the chest, abdomen, and pelvis 01/14/2012and earlier  Findings: Pleural based calcifications are noted at the left lung base, associated pleural thickening and rounded atelectasis.  A small cavitary lesion is identified at the right lung base, 8 mm in diameter and stable in appearance since previous exams to 2010.  The patient has a transvenous pacemaker.  No focal abnormality identified within the liver, spleen, pancreas, or adrenal glands. No focal renal lesion or  hydronephrosis.  The gallbladder is present.  The stomach and small bowel loops are normal in appearance. Colonic loops show moderate stool burden, especially within the rectosigmoid colon where there is fecal impaction.  The appendix is present and measures upper limits normal in thickness.  No periappendiceal stranding or fluid identified.  No evidence for aortic aneurysm. No retroperitoneal or mesenteric adenopathy.  There is grade 1 retrolisthesis of L3 on L4, likely degenerative. There is associated significant disc height loss at the same level. There is grade 1 anterolisthesis of L5 on S1, also likely degenerative and related to bilateral pars defects at L5.  No evidence for acute fracture.  The patient has had previous bilateral hip arthroplasty.  IMPRESSION:  1.  Pleural calcifications and rounded atelectasis at the left lung base. 2.  Small cavitary lesion at the right lower lobe, stable since 2010 and consistent with benign process. 3.  Pacemaker. 4.  No evidence for acute injury. 5.  Significant stool burden with rectosigmoid fecal impaction. 6.  Spondylolisthesis as described with bilateral pars defects at L5, chronic.   Original Report Authenticated By: Norva Pavlov, M.D.   Dg Pelvis 1-2 Views  10/12/2012   *RADIOLOGY REPORT*  Clinical Data: Fall.  Pain along the lateral aspects of the left hip.  PELVIS - 1-2 VIEW  Comparison: CT of the abdomen pelvis 10/12/2012, 06/02/2010  Findings: The patient has had bilateral hip arthroplasty.  There is significant heterotopic bone bilaterally.  No acute fracture or subluxation identified.  There is significant stool within the rectosigmoid colon.  IMPRESSION:  1.  Postoperative changes. 2. No evidence for acute  abnormality.   Original Report Authenticated By: Norva Pavlov, M.D.   Dg Femur Left  10/12/2012   *RADIOLOGY REPORT*  Clinical Data: Pain along the lateral aspect of the left hip. Fall.  LEFT FEMUR - 2 VIEW  Comparison: 04/25/2004  Findings:  Patient has had left hip arthroplasty.  There is heterotopic bone lateral to the hip.  No evidence for acute fracture or subluxation.  IMPRESSION: No evidence for acute  abnormality.   Original Report Authenticated By: Norva Pavlov, M.D.   Ct Head Wo Contrast  10/12/2012   *RADIOLOGY REPORT*  Clinical Data:  Dizziness, fall  CT HEAD WITHOUT CONTRAST CT CERVICAL SPINE WITHOUT CONTRAST  Technique:  Multidetector CT imaging of the head and cervical spine was performed following the standard protocol without intravenous contrast.  Multiplanar CT image reconstructions of the cervical spine were also generated.  Comparison:  09/02/2010  CT HEAD  Findings: No evidence of parenchymal hemorrhage or extra-axial  fluid collection. No mass lesion, mass effect, or midline shift.  No CT evidence of acute infarction.  Mild cortical atrophy.  No ventriculomegaly.  The visualized paranasal sinuses are essentially clear. The mastoid air cells are unopacified.  No evidence of calvarial fracture.  IMPRESSION: No evidence of acute intracranial abnormality.  CT CERVICAL SPINE  Findings: Normal cervical lordosis.  No evidence of fracture or dislocation.  Vertebral body heights are maintained.  The dens appears intact.  No prevertebral soft tissue swelling.  Moderate multilevel degenerative changes.  Visualized thyroid is mildly heterogeneous.  Visualized lungs are notable for stable left apical pleural parenchymal scarring.  IMPRESSION: No evidence of traumatic injury to the cervical spine.  Moderate multilevel degenerative changes.   Original Report Authenticated By: Charline Bills, M.D.   Ct Chest Wo Contrast  10/12/2012   *RADIOLOGY REPORT*  Clinical Data:  Trauma.  CT CHEST, ABDOMEN AND PELVIS WITHOUT CONTRAST  Technique:  Multidetector CT imaging of the chest, abdomen and pelvis was performed following the standard protocol without IV contrast.  Comparison:  03/18/2012  CT CHEST  Findings:  The chest wall is unremarkable.   No supraclavicular or axillary mass or adenopathy.  The bony thorax is intact.  No definite rib, vertebral body or sternal fracture.  The heart is normal in size.  No pericardial effusion.  No mediastinal hematoma.  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  The esophagus is grossly normal.  Pacer wires are in place.  Small scattered mediastinal and hilar lymph nodes.  There are surgical changes involving the left lung with loss of volume and dense apical scarring.  Extensive pleural calcifications asymmetrically on the left side.  These are stable.  The right lung is clear of acute process.  Small pulmonary nodules are stable. No pleural effusion.  Stable left basilar scarring.  IMPRESSION:  1.  No acute bony findings. 2.  Chronic lung changes.  No acute pulmonary findings. 3.  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  CT ABDOMEN AND PELVIS  Findings:  The solid abdominal organs are grossly normal without IV contrast.  No obvious acute injury.  The gallbladder is normal.  The stomach, duodenum, small bowel and colon grossly normal without oral contrast.  The appendix is normal.  No mesenteric or retroperitoneal mass, adenopathy or hematoma.  The aorta is normal in caliber.  Moderate atherosclerotic calcifications.  Significant artifact through the lower pelvis due to bilateral hip prosthesis.  Fairly marked fecal impaction is noted in the rectum. The bladder appears normal.  No pelvic mass or large hematoma.  No inguinal mass or adenopathy.  The pubic symphysis and SI joints are intact.  No definite pelvic fractures.  The lumbar vertebral bodies are intact.  There are bilateral pars defects at L5 with a grade 1 spondylolisthesis and advanced degenerative disc disease at L3-4.  IMPRESSION:  1.  No acute abdominal/pelvic findings without IV or oral contrast. 2.  Intact bony structures. 3.  Fecal impaction in the rectum.   Original Report  Authenticated By: Rudie Meyer, M.D.   Ct Cervical Spine Wo Contrast  10/12/2012   *RADIOLOGY REPORT*  Clinical Data:  Dizziness, fall  CT HEAD WITHOUT CONTRAST CT CERVICAL SPINE WITHOUT CONTRAST  Technique:  Multidetector CT imaging of the head and cervical spine was performed following the standard protocol without intravenous contrast.  Multiplanar CT image reconstructions of the cervical spine were also generated.  Comparison:  09/02/2010  CT  HEAD  Findings: No evidence of parenchymal hemorrhage or extra-axial fluid collection. No mass lesion, mass effect, or midline shift.  No CT evidence of acute infarction.  Mild cortical atrophy.  No ventriculomegaly.  The visualized paranasal sinuses are essentially clear. The mastoid air cells are unopacified.  No evidence of calvarial fracture.  IMPRESSION: No evidence of acute intracranial abnormality.  CT CERVICAL SPINE  Findings: Normal cervical lordosis.  No evidence of fracture or dislocation.  Vertebral body heights are maintained.  The dens appears intact.  No prevertebral soft tissue swelling.  Moderate multilevel degenerative changes.  Visualized thyroid is mildly heterogeneous.  Visualized lungs are notable for stable left apical pleural parenchymal scarring.  IMPRESSION: No evidence of traumatic injury to the cervical spine.  Moderate multilevel degenerative changes.   Original Report Authenticated By: Charline Bills, M.D.      Date: 10/12/2012  Rate: 54  Rhythm: sinus bradycardia  QRS Axis: normal  Intervals: normal  ST/T Wave abnormalities: nonspecific T wave changes  Conduction Disutrbances:none  Narrative Interpretation: Sinus bradycardia, nonspecific T wave changes. When compared with ECG of 07/08/2012, no significant changes are seen.  Old EKG Reviewed: unchanged    1. Syncope   2. Acute on chronic renal failure   3. Hyperkalemia   4. Contusion, back, unspecified laterality, initial encounter       MDM  Syncopal episode with  injury to head, neck, back, pelvis, left hip. Patient is anticoagulated on warfarin, so need to be concerned about possible intracranial bleed and internal injury. CT scans have been ordered of chest, neck, abdomen, pelvis, head. Old records are reviewed and he does have history of renal insufficiency which may preclude CT with contrast.  Creatinine is come back 2.5 which is higher than it had been previously. CT is done without contrast and show no evidence of bony injury and no gross internal injury. Potassium is come back elevated at 5.7. He does not have ECG evidence of hyperkalemia so he is given a dose of oral Kayexalate L. No indication for glucose and insulin, calcium, or sodium bicarbonate. INR has come back subtherapeutic. I have contacted Dr. Dierdre Searles of internal medicine teaching service who has agreed to admit the patient.    Dione Booze, MD 10/12/12 [phone]

## 2012-10-12 NOTE — ED Notes (Signed)
Patient able to move all extremities, +PMS in extremities

## 2012-10-12 NOTE — ED Notes (Addendum)
Patient refuses kayexalate until he can be placed in a room upstairs. He has refused to take from this nurse and the admitting dr. He verbalizes understanding that his potassium being high can put him at risk for heart dysrhythmias

## 2012-10-12 NOTE — ED Notes (Signed)
Pt states slipped in bathtub and fell hitting right posterior head, no loc.  Right rib cage hit and has pain in left hip, previous replacements.  Pt is on coumadin

## 2012-10-12 NOTE — ED Notes (Signed)
Pt transported to radiology.

## 2012-10-12 NOTE — ED Notes (Signed)
Pt unable to stand due to pain.

## 2012-10-12 NOTE — ED Notes (Signed)
Pt request to take kayexalate when he arrives in room upstairs. States "I don't want no diarrhea down here in the emergency room." Pt request respected.

## 2012-10-13 DIAGNOSIS — R42 Dizziness and giddiness: Secondary | ICD-10-CM

## 2012-10-13 DIAGNOSIS — R55 Syncope and collapse: Principal | ICD-10-CM

## 2012-10-13 LAB — CBC WITH DIFFERENTIAL/PLATELET
Basophils Absolute: 0 10*3/uL (ref 0.0–0.1)
Lymphocytes Relative: 36 % (ref 12–46)
Lymphs Abs: 2.7 10*3/uL (ref 0.7–4.0)
Neutro Abs: 3.8 10*3/uL (ref 1.7–7.7)
Neutrophils Relative %: 51 % (ref 43–77)
Platelets: 121 10*3/uL — ABNORMAL LOW (ref 150–400)
RBC: 4.86 MIL/uL (ref 4.22–5.81)
RDW: 14.4 % (ref 11.5–15.5)
WBC: 7.5 10*3/uL (ref 4.0–10.5)

## 2012-10-13 LAB — BASIC METABOLIC PANEL
CO2: 22 mEq/L (ref 19–32)
Calcium: 9.1 mg/dL (ref 8.4–10.5)
Chloride: 103 mEq/L (ref 96–112)
Potassium: 4.5 mEq/L (ref 3.5–5.1)
Sodium: 138 mEq/L (ref 135–145)

## 2012-10-13 LAB — PROTIME-INR
INR: 1.69 — ABNORMAL HIGH (ref 0.00–1.49)
Prothrombin Time: 19.3 seconds — ABNORMAL HIGH (ref 11.6–15.2)

## 2012-10-13 MED ORDER — NICOTINE 21 MG/24HR TD PT24
21.0000 mg | MEDICATED_PATCH | Freq: Every day | TRANSDERMAL | Status: DC
  Administered 2012-10-13 – 2012-10-14 (×2): 21 mg via TRANSDERMAL
  Filled 2012-10-13 (×2): qty 1

## 2012-10-13 MED ORDER — WARFARIN SODIUM 10 MG PO TABS
10.0000 mg | ORAL_TABLET | Freq: Once | ORAL | Status: AC
  Administered 2012-10-13: 10 mg via ORAL
  Filled 2012-10-13: qty 1

## 2012-10-13 MED ORDER — ADULT MULTIVITAMIN W/MINERALS CH
1.0000 | ORAL_TABLET | Freq: Every day | ORAL | Status: DC
  Administered 2012-10-13 – 2012-10-14 (×2): 1 via ORAL
  Filled 2012-10-13 (×2): qty 1

## 2012-10-13 MED ORDER — WARFARIN - PHARMACIST DOSING INPATIENT: Freq: Every day | Status: DC

## 2012-10-13 NOTE — Progress Notes (Signed)
Utilization review completed. Ashaki Frosch, RN, BSN. 

## 2012-10-13 NOTE — Progress Notes (Signed)
ANTICOAGULATION CONSULT NOTE - Initial Consult  Pharmacy Consult for Coumadin Indication: atrial fibrillation  Patient Measurements: Height: 6\' 3"  (190.5 cm) Weight: 150 lb 2.1 oz (68.1 kg) IBW/kg (Calculated) : 84.5   Vital Signs: Temp: 98.5 F (36.9 C) (05/27 1408) BP: 110/69 mmHg (05/27 1408) Pulse Rate: 61 (05/27 1408)    Recent Labs  10/12/12 1215 10/12/12 1817 10/12/12 2305 10/13/12 0532 10/13/12 1418  HGB 16.1  --   --  15.4  --   HCT 45.2  --   --  43.9  --   PLT 128*  --   --  121*  --   LABPROT 20.3*  --   --   --  19.3*  INR 1.81*  --   --   --  1.69*  CREATININE 2.50*  --   --  2.12*  --   TROPONINI <0.30 <0.30 <0.30  --   --     Estimated Creatinine Clearance: 32.1 ml/min (by C-G formula based on Cr of 2.12).   Medical History: Past Medical History  Diagnosis Date  . Hypertension   . Renal insufficiency     Renal U/S 12/04/2009 showed no pathological findings. Labs 12/04/2009 include normal ESR, C3, C4; neg ANA; SPEP showed nonspecific increase in the alpha-2 region with no M-spike; UPEP showed no monoclonal free light chains; urine IFE showed polyclonal increase in feree Kappa and/or free Lambda light chains.  . Chronic systolic heart failure   . Depression   . GERD (gastroesophageal reflux disease)   . Portal vein thrombosis   . Avascular necrosis     right hip s/p replacement  . Gastritis   . Alcohol abuse   . Erectile dysfunction   . Bipolar disorder   . Hydrocele, unspecified   . Intermittent claudication   . Edema   . Lung nodule     Chest CT scan on 12/14/2008 showed a nodular opacity at the left lung base felt to most likely represent scarring.  Follow-up chest CT scan on 06/02/2010 showed parenchymal scarring in the left apex, left  lower lobe, and lingula; some of this scarring at the left base had a nodular appearance, unchanged.  . 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  Thyroid scan  08/28/2009 was normal with no focal areas of abnormal increased or decreased activity seen; the uptake of I 131 sodium iodide at 24 hours was 5.7%.  TSH and free T4 normalized by 08/16/2009.  [name] Pneumonia   . Intestinal obstruction   . Chronic systolic heart failure   . Other primary cardiomyopathies     2D echo on 02/06/2010 showed normal LV size with mild global hypokinesis, estimated EF 45%; mild biatrial enlargement; mild pulmonary hypertension (PA systolic pressure 42 mmHg assuming RA pressure 10 mmHg); normal RV size with mild systolic dysfunction.  [name] COPD (chronic obstructive pulmonary disease)   . Clostridium difficile colitis   . E coli bacteremia   . Cluster headaches   . DVT (deep venous thrombosis)     He has hypercoagulability with multiple occluded venous vessels  . Pleural plaque     Chest CT on 06/02/2010 showed stable extensive calcified pleural plaques involving the left hemithorax, consistent with asbestos related pleural disease.  . Weight loss     Normal colonoscopy by Dr. [name] on 11/06/2010.  . Atrial fibrillation with rapid ventricular response   . 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 Atrial  fibrillation     Paroxysmal, failed medical therapy with amiodarone but has done very well with multaq  . Anginal pain     "every once in awhile now" (03/17/2012)  . Pacemaker   . Shortness of breath     "all the time" (03/17/2012)  . Osteoarthritis   . Spondylosis   . Chronic lower back pain     "cause of my hip" (03/17/2012)    Assessment: 66yo M being continued on chronic coumadin for atrial fibrillation. Patient reports home dose as coumadin 10mg  daily except 7.5mg  Tues/Thursday - however he admits missing doses frequently (states he probably misses at least 3 doses in a given 2 week period) and has not followed-up outpatient for some time.  INR sub-therapeutic today at 1.69 (last dose Sunday 5/25).    Goal of Therapy:  INR 2-3 Monitor  platelets by anticoagulation protocol: Yes   Plan:  1) Coumadin 10 mg x 1 today 2) F/u INR in AM 3) Continue to monitor signs/symptoms of bleeding   Benjaman Pott, PharmD, BCPS 10/13/2012   3:48 PM

## 2012-10-13 NOTE — Evaluation (Signed)
Occupational Therapy Evaluation Patient Details Name: Travis WOOLSEY Sr. MRN: [id number] DOB: 1945/10/05 Today's Date: 10/13/2012 Time: 4098-1191 OT Time Calculation (min): 15 min  OT Assessment / Plan / Recommendation Clinical Impression   This 67 y.o. Male admitted after syncopal episode resulting in fall in bathtub.  Pt presents to OT with pain in hips from fall that limits his independence with functional mobility and BADLs.  He will benefit from continued OT to maximize safety with this in order for him to return home modified independently    OT Assessment  Patient needs continued OT Services    Follow Up Recommendations  No OT follow up;Supervision - Intermittent    Barriers to Discharge None    Equipment Recommendations  3 in 1 bedside comode;Tub/shower seat  (DME needs to be confirmed next visit)   Recommendations for Other Services    Frequency  Min 2X/week    Precautions / Restrictions Precautions Precautions: Fall Restrictions Weight Bearing Restrictions: No       ADL  Eating/Feeding: Independent Where Assessed - Eating/Feeding: Edge of bed Grooming: Wash/dry hands;Wash/dry face;Teeth care;Denture care;Supervision/safety Where Assessed - Grooming: Supported standing Upper Body Bathing: Set up Where Assessed - Upper Body Bathing: Unsupported sitting Lower Body Bathing: Supervision/safety Where Assessed - Lower Body Bathing: Supported sit to stand Upper Body Dressing: Set up Where Assessed - Upper Body Dressing: Unsupported sitting Lower Body Dressing: Supervision/safety Where Assessed - Lower Body Dressing: Supported sit to Pharmacist, hospital: Supervision/safety Statistician Method: Sit to Barista: Comfort height toilet Toileting - Architect and Hygiene: Supervision/safety Where Assessed - Engineer, mining and Hygiene: Standing Equipment Used: Rolling walker Transfers/Ambulation Related to ADLs:  supervision ADL Comments: Pt limited by hip pain due to fall in tub at home.  Pt. reports numbness both feet that he reports has been present for over a year. Denies any other falls    OT Diagnosis: Generalized weakness;Acute pain  OT Problem List: Decreased strength;Decreased activity tolerance;Pain OT Treatment Interventions: Self-care/ADL training;DME and/or AE instruction;Patient/family education;Balance training   OT Goals Acute Rehab OT Goals OT Goal Formulation: With patient Time For Goal Achievement: 10/20/12 Potential to Achieve Goals: Good ADL Goals Pt Will Transfer to Toilet: with modified independence;Ambulation;with DME ADL Goal: Toilet Transfer - Progress: Goal set today Pt Will Perform Tub/Shower Transfer: Tub transfer;with supervision;Ambulation;with DME ADL Goal: Tub/Shower Transfer - Progress: Goal set today  Visit Information  Last OT Received On: 10/13/12 Assistance Needed: +1    Subjective Data  Subjective: "My hips hurt since I fell" Patient Stated Goal: to go home   Prior Functioning     Home Living Lives With: Family Available Help at Discharge: Family;Available PRN/intermittently Type of Home: House Home Access: Stairs to enter [employer] of Steps: 3 Entrance Stairs-Rails: Right Home Layout: One level Bathroom Shower/Tub: Forensic scientist: Standard Home Adaptive Equipment: Quad cane Prior Function Level of Independence: Independent Able to Take Stairs?: Yes Driving: Yes 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 Dominant Hand: Right         Vision/Perception Vision - History Baseline Vision: No visual deficits Patient Visual Report: No change from baseline   Cognition  Cognition Arousal/Alertness: Awake/alert Behavior During Therapy: WFL for tasks assessed/performed Overall Cognitive Status: Within Functional Limits for tasks assessed    Extremity/Trunk Assessment Right Upper  Extremity Assessment RUE ROM/Strength/Tone: WFL for tasks assessed RUE Sensation: History of peripheral neuropathy RUE Coordination: WFL - gross/fine motor Left Upper Extremity Assessment LUE ROM/Strength/Tone:  WFL for tasks assessed LUE Sensation: History of peripheral neuropathy LUE Coordination: WFL - gross/fine motor Right Lower Extremity Assessment RLE ROM/Strength/Tone: WFL for tasks assessed RLE Sensation: History of peripheral neuropathy Left Lower Extremity Assessment LLE ROM/Strength/Tone: WFL for tasks assessed LLE Sensation: History of peripheral neuropathy Trunk Assessment Trunk Assessment: Normal     Mobility Bed Mobility Bed Mobility: Supine to Sit;Sitting - Scoot to Edge of Bed Supine to Sit: 5: Supervision Sitting - Scoot to Edge of Bed: 5: Supervision Details for Bed Mobility Assistance: no assist required Transfers Transfers: Sit to Stand;Stand to Sit Sit to Stand: 5: Supervision;From bed Stand to Sit: 5: Supervision;To bed Details for Transfer Assistance: no assist required, increased pain with weightbearing in the hip     Exercise     Balance Balance Balance Assessed: Yes Dynamic Sitting Balance Dynamic Sitting - Balance Support: Feet supported;During functional activity Dynamic Sitting - Level of Assistance: 7: Independent Dynamic Sitting - Comments: able to don shoes and socks without any noted loss of balance in sitting Static Standing Balance Static Standing - Balance Support: No upper extremity supported Static Standing - Level of Assistance: 5: Stand by assistance Static Standing - Comment/# of Minutes: 1 minute   End of Session OT - End of Session Equipment Utilized During Treatment: Gait belt Activity Tolerance: Patient tolerated treatment well Patient left: in bed;with call bell/phone within reach;with bed alarm set Nurse Communication: Mobility status  GO Functional Limitation: Self care Self Care Current Status (E4540): At least 1  percent but Travis than 20 percent impaired, limited or restricted Self Care Goal Status (J8119): 0 percent impaired, limited or restricted   [name] M 10/13/2012, 12:45 PM

## 2012-10-13 NOTE — Progress Notes (Signed)
INITIAL NUTRITION ASSESSMENT  DOCUMENTATION CODES Per approved criteria  -Severe malnutrition in the context of chronic illness   INTERVENTION:  Continue Ensure Complete twice daily (350 kcals, 13 gm protein per 8 fl oz bottle)  Multivitamin with minerals daily  RD to follow for nutrition care plan  NUTRITION DIAGNOSIS: Increased nutrient needs related to malnutrition as evidenced by estimated nutrition needs  Goal: Oral intake with meals & supplements to meet >/= 90% of estimated nutrition needs  Monitor:  PO & supplemental intake, weight, labs, I/O's  Reason for Assessment: Malnutrition Screening Tool Report  67 y.o. male  Admitting Dx: Syncope  ASSESSMENT: Patient presented with 1 week of lightheadedness associated with fall (hiting the back of his right head, right side of back, and left hip); has been having palpitations, piercing pain in left chest radiating to left back.   Patient reports he is not eating; reports usual intake is 1 meal per day, however, there are days he doesn't consume anything; states he's lost 60-70 lbs in the past year; drinks Ensure supplements at home ---> orders in place; patient with visible muscle & fat loss to upper body (clavicles, shoulders, biceps/triceps).  Patient meets criteria for severe malnutrition in the context of chronic illness given < 75% intake of estimated energy requirement for > 1 month, 40% weight loss x 1 year and severe muscle & subcutaneous fat loss.  Height: Ht Readings from Last 1 Encounters:  10/13/12 6\' 3"  (1.905 m)    Weight: Wt Readings from Last 1 Encounters:  10/13/12 150 lb 2.1 oz (68.1 kg)    Ideal Body Weight: 196 lb  % Ideal Body Weight: 76%  Wt Readings from Last 10 Encounters:  10/13/12 150 lb 2.1 oz (68.1 kg)  09/15/12 151 lb (68.493 kg)  07/13/12 154 lb (69.854 kg)  07/08/12 161 lb 9.6 oz (73.301 kg)  07/06/12 158 lb 3.2 oz (71.759 kg)  04/22/12 150 lb 4.8 oz (68.176 kg)  03/23/12 160 lb  1.6 oz (72.621 kg)  03/19/12 145 lb 8 oz (65.998 kg)  11/20/11 161 lb 1.6 oz (73.074 kg)  10/01/11 155 lb 8 oz (70.534 kg)    Usual Body Weight: 210 lb  % Usual Body Weight: 71%  BMI:  Body mass index is 18.77 kg/(m^2).  Estimated Nutritional Needs: Kcal: 2000-2200 Protein: 100-110 gm Fluid: 2.0-2.2 L  Skin: Intact  Diet Order: General  EDUCATION NEEDS: -No education needs identified at this time   Intake/Output Summary (Last 24 hours) at 10/13/12 1127 Last data filed at 10/13/12 1610  Gross per 24 hour  Intake      0 ml  Output    350 ml  Net   -350 ml    Last BM: 5/26  Labs:   Recent Labs Lab 10/12/12 1215 10/12/12 1552 10/13/12 0532  NA 139  --  138  K 5.7*  --  4.5  CL 105  --  103  CO2 24  --  22  BUN 44*  --  43*  CREATININE 2.50*  --  2.12*  CALCIUM 9.5  --  9.1  MG  --  2.1  --   GLUCOSE 89  --  88    Scheduled Meds: . buPROPion  75 mg Oral q morning - 10a  . diltiazem  240 mg Oral q morning - 10a  . dronedarone  400 mg Oral q morning - 10a  . Ensure Plus  237 mL Oral BID BM  . nicotine  21  mg Transdermal Daily  . pantoprazole  40 mg Oral Daily  . sodium chloride  3 mL Intravenous Q12H  . sodium chloride  3 mL Intravenous Q12H  . tiotropium  18 mcg Inhalation Daily    Continuous Infusions:   Past Medical History  Diagnosis Date  . Hypertension   . Renal insufficiency     Renal U/S 12/04/2009 showed no pathological findings. Labs 12/04/2009 include normal ESR, C3, C4; neg ANA; SPEP showed nonspecific increase in the alpha-2 region with no M-spike; UPEP showed no monoclonal free light chains; urine IFE showed polyclonal increase in feree Kappa and/or free Lambda light chains.  . Chronic systolic heart failure   . Depression   . GERD (gastroesophageal reflux disease)   . Portal vein thrombosis   . Avascular necrosis     right hip s/p replacement  . Gastritis   . Alcohol abuse   . Erectile dysfunction   . Bipolar disorder   .  Hydrocele, unspecified   . Intermittent claudication   . Edema   . Lung nodule     Chest CT scan on 12/14/2008 showed a nodular opacity at the left lung base felt to most likely represent scarring.  Follow-up chest CT scan on 06/02/2010 showed parenchymal scarring in the left apex, left  lower lobe, and lingula; some of this scarring at the left base had a nodular appearance, unchanged.  . 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  Thyroid scan 08/28/2009 was normal with no focal areas of abnormal increased or decreased activity seen; the uptake of I 131 sodium iodide at 24 hours was 5.7%.  TSH and free T4 normalized by 08/16/2009.  [name] Pneumonia   . Intestinal obstruction   . Chronic systolic heart failure   . Other primary cardiomyopathies     2D echo on 02/06/2010 showed normal LV size with mild global hypokinesis, estimated EF 45%; mild biatrial enlargement; mild pulmonary hypertension (PA systolic pressure 42 mmHg assuming RA pressure 10 mmHg); normal RV size with mild systolic dysfunction.  [name] COPD (chronic obstructive pulmonary disease)   . Clostridium difficile colitis   . E coli bacteremia   . Cluster headaches   . DVT (deep venous thrombosis)     He has hypercoagulability with multiple occluded venous vessels  . Pleural plaque     Chest CT on 06/02/2010 showed stable extensive calcified pleural plaques involving the left hemithorax, consistent with asbestos related pleural disease.  . Weight loss     Normal colonoscopy by Dr. [name] on 11/06/2010.  . Atrial fibrillation with rapid ventricular response   . 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 Atrial fibrillation     Paroxysmal, failed medical therapy with amiodarone but has done very well with multaq  . Anginal pain     "every once in awhile now" (03/17/2012)  . Pacemaker   . Shortness of breath     "all the time" (03/17/2012)  . Osteoarthritis   . Spondylosis   .  Chronic lower back pain     "cause of my hip" (03/17/2012)    Past Surgical History  Procedure Laterality Date  . Left hip bipolar hemiarthroplasty  09/09/2002    By Dr. Kristeen Miss  . Insert / replace / remove pacemaker  02/2003    By Dr. Charolette Child, gen change by Fawn Kirk  . Total hip arthroplasty  03/08/2008    right; By Dr. Charlesetta Shanks  Arthur Holms, RD, LDN Pager #: [phone] After-Hours Pager #: [phone]

## 2012-10-13 NOTE — H&P (Signed)
Internal Medicine Teaching Service Attending Note Date: 10/13/2012  Patient name: Travis KLINGBERG Sr.  Medical record number: [id number]  Date of birth: 03-30-1946   I have seen and evaluated Travis Capuchin Sr. and discussed their care with the Residency Team. [name] was admitted for a fall. This is a new problem. He has had dizziness in the past and has learned to stand slowly but has never had sxs this bad that it resulted in a fall. He had finished a shower Sunday 25th and was turning around when he fell, hitting L hip and R post head and back. He had no CP, SOB, palp, weakness. BC of pain, he started using a cane after the fall since walking increased the pain. His pain continues and is present on exam today.   Physical Exam: Blood pressure 119/72, pulse 69, temperature 98.5 F (36.9 C), temperature source Oral, resp. rate 18, height 6\' 3"  (1.905 m), weight 150 lb 2.1 oz (68.1 kg), SpO2 98.00%. General appearance: alert, cooperative, appears stated age and no distress Head: Normocephalic, without obvious abnormality, atraumatic Eyes: negative findings: lids and lashes normal, conjunctivae and sclerae normal and EOMI Ears: nl external pinna Lungs: clear to auscultation bilaterally and nl resp effort Heart: regular rate and rhythm, S1, S2 normal, no murmur, click, rub or gallop, no click and no rub Abdomen: soft, non-tender; bowel sounds normal; no masses,  no organomegaly Extremities: extremities normal, atraumatic, no cyanosis or edema Pulses:  L brachial 2+ R brachial 2+                      L dorsalis pedis 2+ R dorsalis pedis 2+   Skin: Skin colour and texture nl. 3 callous L foot - painful on L heel and then one on lateral MCP, third on sole. On R callous on lateral MCP Neurologic: Motor: grade 5 R hip flexion / extension grade 4 L hip extension / flexion grade 5 L ankle doris / plantar flexion grade 4 R dorsi / plantar flexion Decreased sensation B to mid shins to touch  Lab  results: Results for orders placed during the hospital encounter of 10/12/12 (from the past 24 hour(s))  ETHANOL     Status: None   Collection Time    10/12/12  3:52 PM      Result Value Range   Alcohol, Ethyl (B) <11  0 - 11 mg/dL  HEPATIC FUNCTION PANEL     Status: None   Collection Time    10/12/12  3:52 PM      Result Value Range   Total Protein 7.1  6.0 - 8.3 g/dL   Albumin 3.7  3.5 - 5.2 g/dL   AST 19  0 - 37 U/L   ALT 9  0 - 53 U/L   Alkaline Phosphatase 66  39 - 117 U/L   Total Bilirubin 0.4  0.3 - 1.2 mg/dL   Bilirubin, Direct <4.0  0.0 - 0.3 mg/dL   Indirect Bilirubin NOT CALCULATED  0.3 - 0.9 mg/dL  MAGNESIUM     Status: None   Collection Time    10/12/12  3:52 PM      Result Value Range   Magnesium 2.1  1.5 - 2.5 mg/dL  URINE RAPID DRUG SCREEN (HOSP PERFORMED)     Status: Abnormal   Collection Time    10/12/12  5:24 PM      Result Value Range   Opiates POSITIVE (*) NONE DETECTED  Cocaine NONE DETECTED  NONE DETECTED   Benzodiazepines NONE DETECTED  NONE DETECTED   Amphetamines NONE DETECTED  NONE DETECTED   Tetrahydrocannabinol POSITIVE (*) NONE DETECTED   Barbiturates NONE DETECTED  NONE DETECTED  URINALYSIS, ROUTINE W REFLEX MICROSCOPIC     Status: Abnormal   Collection Time    10/12/12  5:24 PM      Result Value Range   Color, Urine YELLOW  YELLOW   APPearance CLEAR  CLEAR   Specific Gravity, Urine 1.026  1.005 - 1.030   pH 5.5  5.0 - 8.0   Glucose, UA NEGATIVE  NEGATIVE mg/dL   Hgb urine dipstick NEGATIVE  NEGATIVE   Bilirubin Urine SMALL (*) NEGATIVE   Ketones, ur NEGATIVE  NEGATIVE mg/dL   Protein, ur NEGATIVE  NEGATIVE mg/dL   Urobilinogen, UA 1.0  0.0 - 1.0 mg/dL   Nitrite NEGATIVE  NEGATIVE   Leukocytes, UA SMALL (*) NEGATIVE  SODIUM, URINE, RANDOM     Status: None   Collection Time    10/12/12  5:24 PM      Result Value Range   Sodium, Ur 89    URINE MICROSCOPIC-ADD ON     Status: Abnormal   Collection Time    10/12/12  5:24 PM       Result Value Range   Squamous Epithelial / LPF MANY (*) RARE   WBC, UA 3-6  <3 WBC/hpf  TROPONIN I     Status: None   Collection Time    10/12/12  6:17 PM      Result Value Range   Troponin I <0.30  <0.30 ng/mL  TROPONIN I     Status: None   Collection Time    10/12/12 11:05 PM      Result Value Range   Troponin I <0.30  <0.30 ng/mL  CBC WITH DIFFERENTIAL     Status: Abnormal   Collection Time    10/13/12  5:32 AM      Result Value Range   WBC 7.5  4.0 - 10.5 K/uL   RBC 4.86  4.22 - 5.81 MIL/uL   Hemoglobin 15.4  13.0 - 17.0 g/dL   HCT 16.1  09.6 - 04.5 %   MCV 90.3  78.0 - 100.0 fL   MCH 31.7  26.0 - 34.0 pg   MCHC 35.1  30.0 - 36.0 g/dL   RDW 40.9  81.1 - 91.4 %   Platelets 121 (*) 150 - 400 K/uL   Neutrophils Relative % 51  43 - 77 %   Neutro Abs 3.8  1.7 - 7.7 K/uL   Lymphocytes Relative 36  12 - 46 %   Lymphs Abs 2.7  0.7 - 4.0 K/uL   Monocytes Relative 9  3 - 12 %   Monocytes Absolute 0.7  0.1 - 1.0 K/uL   Eosinophils Relative 4  0 - 5 %   Eosinophils Absolute 0.3  0.0 - 0.7 K/uL   Basophils Relative 0  0 - 1 %   Basophils Absolute 0.0  0.0 - 0.1 K/uL  BASIC METABOLIC PANEL     Status: Abnormal   Collection Time    10/13/12  5:32 AM      Result Value Range   Sodium 138  135 - 145 mEq/L   Potassium 4.5  3.5 - 5.1 mEq/L   Chloride 103  96 - 112 mEq/L   CO2 22  19 - 32 mEq/L   Glucose, Bld 88  70 - 99  mg/dL   BUN 43 (*) 6 - 23 mg/dL   Creatinine, Ser 1.61 (*) 0.50 - 1.35 mg/dL   Calcium 9.1  8.4 - 09.6 mg/dL   GFR calc non Af Amer 30 (*) >90 mL/min   GFR calc Af Amer 35 (*) >90 mL/min    Imaging results:  Ct Abdomen Pelvis Wo Contrast  10/12/2012   *RADIOLOGY REPORT*  Clinical Data: The patient became dizzy and fell in bathroom.  No head or neck pain.  Hit right side of chest and abdomen.  Recent weight loss.  Elevated creatinine.  CT ABDOMEN AND PELVIS WITHOUT CONTRAST  Technique:  Multidetector CT imaging of the abdomen and pelvis was performed following the  standard protocol without intravenous contrast.  Comparison: CT of the chest, abdomen, and pelvis 01/14/2012and earlier  Findings: Pleural based calcifications are noted at the left lung base, associated pleural thickening and rounded atelectasis.  A small cavitary lesion is identified at the right lung base, 8 mm in diameter and stable in appearance since previous exams to 2010.  The patient has a transvenous pacemaker.  No focal abnormality identified within the liver, spleen, pancreas, or adrenal glands. No focal renal lesion or hydronephrosis.  The gallbladder is present.  The stomach and small bowel loops are normal in appearance. Colonic loops show moderate stool burden, especially within the rectosigmoid colon where there is fecal impaction.  The appendix is present and measures upper limits normal in thickness.  No periappendiceal stranding or fluid identified.  No evidence for aortic aneurysm. No retroperitoneal or mesenteric adenopathy.  There is grade 1 retrolisthesis of L3 on L4, likely degenerative. There is associated significant disc height loss at the same level. There is grade 1 anterolisthesis of L5 on S1, also likely degenerative and related to bilateral pars defects at L5.  No evidence for acute fracture.  The patient has had previous bilateral hip arthroplasty.  IMPRESSION:  1.  Pleural calcifications and rounded atelectasis at the left lung base. 2.  Small cavitary lesion at the right lower lobe, stable since 2010 and consistent with benign process. 3.  Pacemaker. 4.  No evidence for acute injury. 5.  Significant stool burden with rectosigmoid fecal impaction. 6.  Spondylolisthesis as described with bilateral pars defects at L5, chronic.   Original Report Authenticated By: Norva Pavlov, M.D.   Dg Pelvis 1-2 Views  10/12/2012   *RADIOLOGY REPORT*  Clinical Data: Fall.  Pain along the lateral aspects of the left hip.  PELVIS - 1-2 VIEW  Comparison: CT of the abdomen pelvis 10/12/2012,  06/02/2010  Findings: The patient has had bilateral hip arthroplasty.  There is significant heterotopic bone bilaterally.  No acute fracture or subluxation identified.  There is significant stool within the rectosigmoid colon.  IMPRESSION:  1.  Postoperative changes. 2. No evidence for acute  abnormality.   Original Report Authenticated By: Norva Pavlov, M.D.   Dg Femur Left  10/12/2012   *RADIOLOGY REPORT*  Clinical Data: Pain along the lateral aspect of the left hip. Fall.  LEFT FEMUR - 2 VIEW  Comparison: 04/25/2004  Findings: Patient has had left hip arthroplasty.  There is heterotopic bone lateral to the hip.  No evidence for acute fracture or subluxation.  IMPRESSION: No evidence for acute  abnormality.   Original Report Authenticated By: Norva Pavlov, M.D.   Ct Head Wo Contrast  10/12/2012   *RADIOLOGY REPORT*  Clinical Data:  Dizziness, fall  CT HEAD WITHOUT CONTRAST CT CERVICAL SPINE WITHOUT CONTRAST  Technique:  Multidetector CT imaging of the head and cervical spine was performed following the standard protocol without intravenous contrast.  Multiplanar CT image reconstructions of the cervical spine were also generated.  Comparison:  09/02/2010  CT HEAD  Findings: No evidence of parenchymal hemorrhage or extra-axial fluid collection. No mass lesion, mass effect, or midline shift.  No CT evidence of acute infarction.  Mild cortical atrophy.  No ventriculomegaly.  The visualized paranasal sinuses are essentially clear. The mastoid air cells are unopacified.  No evidence of calvarial fracture.  IMPRESSION: No evidence of acute intracranial abnormality.  CT CERVICAL SPINE  Findings: Normal cervical lordosis.  No evidence of fracture or dislocation.  Vertebral body heights are maintained.  The dens appears intact.  No prevertebral soft tissue swelling.  Moderate multilevel degenerative changes.  Visualized thyroid is mildly heterogeneous.  Visualized lungs are notable for stable left apical pleural  parenchymal scarring.  IMPRESSION: No evidence of traumatic injury to the cervical spine.  Moderate multilevel degenerative changes.   Original Report Authenticated By: Charline Bills, M.D.   Ct Chest Wo Contrast  10/12/2012   *RADIOLOGY REPORT*  Clinical Data:  Trauma.  CT CHEST, ABDOMEN AND PELVIS WITHOUT CONTRAST  Technique:  Multidetector CT imaging of the chest, abdomen and pelvis was performed following the standard protocol without IV contrast.  Comparison:  03/18/2012  CT CHEST  Findings:  The chest wall is unremarkable.  No supraclavicular or axillary mass or adenopathy.  The bony thorax is intact.  No definite rib, vertebral body or sternal fracture.  The heart is normal in size.  No pericardial effusion.  No mediastinal hematoma.  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  The esophagus is grossly normal.  Pacer wires are in place.  Small scattered mediastinal and hilar lymph nodes.  There are surgical changes involving the left lung with loss of volume and dense apical scarring.  Extensive pleural calcifications asymmetrically on the left side.  These are stable.  The right lung is clear of acute process.  Small pulmonary nodules are stable. No pleural effusion.  Stable left basilar scarring.  IMPRESSION:  1.  No acute bony findings. 2.  Chronic lung changes.  No acute pulmonary findings. 3.  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  CT ABDOMEN AND PELVIS  Findings:  The solid abdominal organs are grossly normal without IV contrast.  No obvious acute injury.  The gallbladder is normal.  The stomach, duodenum, small bowel and colon grossly normal without oral contrast.  The appendix is normal.  No mesenteric or retroperitoneal mass, adenopathy or hematoma.  The aorta is normal in caliber.  Moderate atherosclerotic calcifications.  Significant artifact through the lower pelvis due to bilateral hip prosthesis.  Fairly marked fecal  impaction is noted in the rectum. The bladder appears normal.  No pelvic mass or large hematoma.  No inguinal mass or adenopathy.  The pubic symphysis and SI joints are intact.  No definite pelvic fractures.  The lumbar vertebral bodies are intact.  There are bilateral pars defects at L5 with a grade 1 spondylolisthesis and advanced degenerative disc disease at L3-4.  IMPRESSION:  1.  No acute abdominal/pelvic findings without IV or oral contrast. 2.  Intact bony structures. 3.  Fecal impaction in the rectum.   Original Report Authenticated By: Rudie Meyer, M.D.   Ct Cervical Spine Wo Contrast  10/12/2012   *RADIOLOGY REPORT*  Clinical Data:  Dizziness, fall  CT HEAD WITHOUT CONTRAST CT CERVICAL SPINE WITHOUT CONTRAST  Technique:  Multidetector CT imaging of the head and cervical spine was performed following the standard protocol without intravenous contrast.  Multiplanar CT image reconstructions of the cervical spine were also generated.  Comparison:  09/02/2010  CT HEAD  Findings: No evidence of parenchymal hemorrhage or extra-axial fluid collection. No mass lesion, mass effect, or midline shift.  No CT evidence of acute infarction.  Mild cortical atrophy.  No ventriculomegaly.  The visualized paranasal sinuses are essentially clear. The mastoid air cells are unopacified.  No evidence of calvarial fracture.  IMPRESSION: No evidence of acute intracranial abnormality.  CT CERVICAL SPINE  Findings: Normal cervical lordosis.  No evidence of fracture or dislocation.  Vertebral body heights are maintained.  The dens appears intact.  No prevertebral soft tissue swelling.  Moderate multilevel degenerative changes.  Visualized thyroid is mildly heterogeneous.  Visualized lungs are notable for stable left apical pleural parenchymal scarring.  IMPRESSION: No evidence of traumatic injury to the cervical spine.  Moderate multilevel degenerative changes.   Original Report Authenticated By: Charline Bills, M.D.     Assessment and Plan: I agree with the formulated Assessment and Plan with the following changes:   1. New, acute fall - Pt has no h/o falls. No fracture on plain films. Etiology not clear at this time. AMI has been R/O. Pt monitored on tele to capture arrythmia if present. Ct head negative for CNS etiology. It may have been mechanical. Therefore, PT / OT consult.   2. Acute renal failure - this is a new problem. Pt states has had decreased appetite for 2 yrs. Has been in bed one day but states was eating. Pt states 1 IBU if runs out of percocet but hasn't run out of it recently. No contrast, no hypotension, was not orthostatic. No rhabdo. So etiology is not clear but resolving without specific intervention. Creatinine has been trending down with just PO intake. Cont to follow.    3. R leg weakness - this was pronounced on exam. Pt states has always been like this. PT consult.  4. B leg numbness - TSH nl in dec 2013. No DM. B 12 low normal in 2010 so will repeat.   5. Callous - the heel is troubling to him. Has seen podiatry as outpt. Will rec some padding to prevent further irritation.   Likely D/C in AM  Burns Spain, MD 5/27/20141:55 PM

## 2012-10-13 NOTE — Progress Notes (Addendum)
Subjective: Patient is not feeling lightheaded today.  He wants to decrease the amount of blood work being obtained.  He is having left hip pain after fall relieved with his home pain medications.  He c/o of decreased appetite still. Patient stated he did well walking in the hallway with PT and may need a walker  Objective: Vital signs in last 24 hours: Filed Vitals:   10/13/12 0500 10/13/12 0925 10/13/12 1125 10/13/12 1408  BP: 125/83 119/72  110/69  Pulse: 69   61  Temp: 98.5 F (36.9 C)   98.5 F (36.9 C)  TempSrc:      Resp: 18   20  Height: 6\' 3"  (1.905 m)     Weight: 150 lb 2.1 oz (68.1 kg)     SpO2: 98%  98% 99%   Weight change:   Intake/Output Summary (Last 24 hours) at 10/13/12 1423 Last data filed at 10/13/12 1610  Gross per 24 hour  Intake      0 ml  Output    350 ml  Net   -350 ml   Vitals reviewed. General: resting in bed, NAD, alert and oriented x 3 HEENT: /at, no scleral icterus Cardiac: RRR, no rubs, murmurs or gallops Pulm: clear to auscultation bilaterally, no wheezes, rales, or rhonchi Abd: soft, nontender, nondistended, BS present Ext: warm and well perfused, no pedal edema Neuro: alert and oriented X3, grossly neurologically intact moving al 4 extremities   Lab Results: Basic Metabolic Panel:  Recent Labs Lab 10/12/12 1215 10/12/12 1552 10/13/12 0532  NA 139  --  138  K 5.7*  --  4.5  CL 105  --  103  CO2 24  --  22  GLUCOSE 89  --  88  BUN 44*  --  43*  CREATININE 2.50*  --  2.12*  CALCIUM 9.5  --  9.1  MG  --  2.1  --    Liver Function Tests:  Recent Labs Lab 10/12/12 1552  AST 19  ALT 9  ALKPHOS 66  BILITOT 0.4  PROT 7.1  ALBUMIN 3.7   CBC:  Recent Labs Lab 10/12/12 1215 10/13/12 0532  WBC 8.5 7.5  NEUTROABS 5.6 3.8  HGB 16.1 15.4  HCT 45.2 43.9  MCV 90.8 90.3  PLT 128* 121*   Cardiac Enzymes:  Recent Labs Lab 10/12/12 1215 10/12/12 1817 10/12/12 2305  TROPONINI <0.30 <0.30 <0.30   Coagulation:  Recent  Labs Lab 10/12/12 1215  LABPROT 20.3*  INR 1.81*   Urine Drug Screen: Drugs of Abuse     Component Value Date/Time   LABOPIA POSITIVE* 10/12/2012 1724   LABOPIA NEGATIVE 12/27/2008 1943   COCAINSCRNUR NONE DETECTED 10/12/2012 1724   COCAINSCRNUR NEG 07/06/2009 2225   LABBENZ NONE DETECTED 10/12/2012 1724   LABBENZ NEG 07/06/2009 2225   AMPHETMU NONE DETECTED 10/12/2012 1724   AMPHETMU NEG 07/06/2009 2225   THCU POSITIVE* 10/12/2012 1724   LABBARB NONE DETECTED 10/12/2012 1724    Alcohol Level:  Recent Labs Lab 10/12/12 1552  ETH <11   Urinalysis:  Recent Labs Lab 10/12/12 1724  COLORURINE YELLOW  LABSPEC 1.026  PHURINE 5.5  GLUCOSEU NEGATIVE  HGBUR NEGATIVE  BILIRUBINUR SMALL*  KETONESUR NEGATIVE  PROTEINUR NEGATIVE  UROBILINOGEN 1.0  NITRITE NEGATIVE  LEUKOCYTESUR SMALL*   Misc. Labs: Folate  B12   Micro Results: No results found for this or any previous visit (from the past 240 hour(s)). Studies/Results: Ct Abdomen Pelvis Wo Contrast  10/12/2012   *RADIOLOGY  REPORT*  Clinical Data: The patient became dizzy and fell in bathroom.  No head or neck pain.  Hit right side of chest and abdomen.  Recent weight loss.  Elevated creatinine.  CT ABDOMEN AND PELVIS WITHOUT CONTRAST  Technique:  Multidetector CT imaging of the abdomen and pelvis was performed following the standard protocol without intravenous contrast.  Comparison: CT of the chest, abdomen, and pelvis 01/14/2012and earlier  Findings: Pleural based calcifications are noted at the left lung base, associated pleural thickening and rounded atelectasis.  A small cavitary lesion is identified at the right lung base, 8 mm in diameter and stable in appearance since previous exams to 2010.  The patient has a transvenous pacemaker.  No focal abnormality identified within the liver, spleen, pancreas, or adrenal glands. No focal renal lesion or hydronephrosis.  The gallbladder is present.  The stomach and small bowel loops are  normal in appearance. Colonic loops show moderate stool burden, especially within the rectosigmoid colon where there is fecal impaction.  The appendix is present and measures upper limits normal in thickness.  No periappendiceal stranding or fluid identified.  No evidence for aortic aneurysm. No retroperitoneal or mesenteric adenopathy.  There is grade 1 retrolisthesis of L3 on L4, likely degenerative. There is associated significant disc height loss at the same level. There is grade 1 anterolisthesis of L5 on S1, also likely degenerative and related to bilateral pars defects at L5.  No evidence for acute fracture.  The patient has had previous bilateral hip arthroplasty.  IMPRESSION:  1.  Pleural calcifications and rounded atelectasis at the left lung base. 2.  Small cavitary lesion at the right lower lobe, stable since 2010 and consistent with benign process. 3.  Pacemaker. 4.  No evidence for acute injury. 5.  Significant stool burden with rectosigmoid fecal impaction. 6.  Spondylolisthesis as described with bilateral pars defects at L5, chronic.   Original Report Authenticated By: Norva Pavlov, M.D.   Dg Pelvis 1-2 Views  10/12/2012   *RADIOLOGY REPORT*  Clinical Data: Fall.  Pain along the lateral aspects of the left hip.  PELVIS - 1-2 VIEW  Comparison: CT of the abdomen pelvis 10/12/2012, 06/02/2010  Findings: The patient has had bilateral hip arthroplasty.  There is significant heterotopic bone bilaterally.  No acute fracture or subluxation identified.  There is significant stool within the rectosigmoid colon.  IMPRESSION:  1.  Postoperative changes. 2. No evidence for acute  abnormality.   Original Report Authenticated By: Norva Pavlov, M.D.   Dg Femur Left  10/12/2012   *RADIOLOGY REPORT*  Clinical Data: Pain along the lateral aspect of the left hip. Fall.  LEFT FEMUR - 2 VIEW  Comparison: 04/25/2004  Findings: Patient has had left hip arthroplasty.  There is heterotopic bone lateral to the hip.   No evidence for acute fracture or subluxation.  IMPRESSION: No evidence for acute  abnormality.   Original Report Authenticated By: Norva Pavlov, M.D.   Ct Head Wo Contrast  10/12/2012   *RADIOLOGY REPORT*  Clinical Data:  Dizziness, fall  CT HEAD WITHOUT CONTRAST CT CERVICAL SPINE WITHOUT CONTRAST  Technique:  Multidetector CT imaging of the head and cervical spine was performed following the standard protocol without intravenous contrast.  Multiplanar CT image reconstructions of the cervical spine were also generated.  Comparison:  09/02/2010  CT HEAD  Findings: No evidence of parenchymal hemorrhage or extra-axial fluid collection. No mass lesion, mass effect, or midline shift.  No CT evidence of acute infarction.  Mild cortical atrophy.  No ventriculomegaly.  The visualized paranasal sinuses are essentially clear. The mastoid air cells are unopacified.  No evidence of calvarial fracture.  IMPRESSION: No evidence of acute intracranial abnormality.  CT CERVICAL SPINE  Findings: Normal cervical lordosis.  No evidence of fracture or dislocation.  Vertebral body heights are maintained.  The dens appears intact.  No prevertebral soft tissue swelling.  Moderate multilevel degenerative changes.  Visualized thyroid is mildly heterogeneous.  Visualized lungs are notable for stable left apical pleural parenchymal scarring.  IMPRESSION: No evidence of traumatic injury to the cervical spine.  Moderate multilevel degenerative changes.   Original Report Authenticated By: Charline Bills, M.D.   Ct Chest Wo Contrast  10/12/2012   *RADIOLOGY REPORT*  Clinical Data:  Trauma.  CT CHEST, ABDOMEN AND PELVIS WITHOUT CONTRAST  Technique:  Multidetector CT imaging of the chest, abdomen and pelvis was performed following the standard protocol without IV contrast.  Comparison:  03/18/2012  CT CHEST  Findings:  The chest wall is unremarkable.  No supraclavicular or axillary mass or adenopathy.  The bony thorax is intact.  No  definite rib, vertebral body or sternal fracture.  The heart is normal in size.  No pericardial effusion.  No mediastinal hematoma.  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  The esophagus is grossly normal.  Pacer wires are in place.  Small scattered mediastinal and hilar lymph nodes.  There are surgical changes involving the left lung with loss of volume and dense apical scarring.  Extensive pleural calcifications asymmetrically on the left side.  These are stable.  The right lung is clear of acute process.  Small pulmonary nodules are stable. No pleural effusion.  Stable left basilar scarring.  IMPRESSION:  1.  No acute bony findings. 2.  Chronic lung changes.  No acute pulmonary findings. 3.  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  CT ABDOMEN AND PELVIS  Findings:  The solid abdominal organs are grossly normal without IV contrast.  No obvious acute injury.  The gallbladder is normal.  The stomach, duodenum, small bowel and colon grossly normal without oral contrast.  The appendix is normal.  No mesenteric or retroperitoneal mass, adenopathy or hematoma.  The aorta is normal in caliber.  Moderate atherosclerotic calcifications.  Significant artifact through the lower pelvis due to bilateral hip prosthesis.  Fairly marked fecal impaction is noted in the rectum. The bladder appears normal.  No pelvic mass or large hematoma.  No inguinal mass or adenopathy.  The pubic symphysis and SI joints are intact.  No definite pelvic fractures.  The lumbar vertebral bodies are intact.  There are bilateral pars defects at L5 with a grade 1 spondylolisthesis and advanced degenerative disc disease at L3-4.  IMPRESSION:  1.  No acute abdominal/pelvic findings without IV or oral contrast. 2.  Intact bony structures. 3.  Fecal impaction in the rectum.   Original Report Authenticated By: Rudie Meyer, M.D.   Ct Cervical Spine Wo Contrast  10/12/2012    *RADIOLOGY REPORT*  Clinical Data:  Dizziness, fall  CT HEAD WITHOUT CONTRAST CT CERVICAL SPINE WITHOUT CONTRAST  Technique:  Multidetector CT imaging of the head and cervical spine was performed following the standard protocol without intravenous contrast.  Multiplanar CT image reconstructions of the cervical spine were also generated.  Comparison:  09/02/2010  CT HEAD  Findings: No evidence of parenchymal hemorrhage or extra-axial fluid collection. No mass lesion, mass effect, or  midline shift.  No CT evidence of acute infarction.  Mild cortical atrophy.  No ventriculomegaly.  The visualized paranasal sinuses are essentially clear. The mastoid air cells are unopacified.  No evidence of calvarial fracture.  IMPRESSION: No evidence of acute intracranial abnormality.  CT CERVICAL SPINE  Findings: Normal cervical lordosis.  No evidence of fracture or dislocation.  Vertebral body heights are maintained.  The dens appears intact.  No prevertebral soft tissue swelling.  Moderate multilevel degenerative changes.  Visualized thyroid is mildly heterogeneous.  Visualized lungs are notable for stable left apical pleural parenchymal scarring.  IMPRESSION: No evidence of traumatic injury to the cervical spine.  Moderate multilevel degenerative changes.   Original Report Authenticated By: Charline Bills, M.D.   Medications:  Scheduled Meds: . buPROPion  75 mg Oral q morning - 10a  . diltiazem  240 mg Oral q morning - 10a  . dronedarone  400 mg Oral q morning - 10a  . Ensure Plus  237 mL Oral BID BM  . multivitamin with minerals  1 tablet Oral Daily  . nicotine  21 mg Transdermal Daily  . pantoprazole  40 mg Oral Daily  . sodium chloride  3 mL Intravenous Q12H  . sodium chloride  3 mL Intravenous Q12H  . tiotropium  18 mcg Inhalation Daily   Continuous Infusions:  PRN Meds:.sodium chloride, albuterol, ondansetron (ZOFRAN) IV, oxyCODONE-acetaminophen, sodium chloride Assessment/Plan: 67 y.o male s/p pacemaker,  atrial fibrillation on Coumadin, history of tachycardia-bradycardia syndrome presents for 1 week of positional lightheadedness associated with fall the day prior to admission.   1. Presyncope  -Etiology could be arrhythmia as patient has a history of tachycardia-bradycardia syndrome s/p pacemaker.  His pacemaker was just interogatted in 08/2012 and was normal.  Etiology may be neurocardiogenic (i.e vasovagal) as he has had decreased oral intake due to decreased appetite prior to admission.  Another etiology could be orthostatic hypotension due to medications such as Diltiazem), will r/o MI.  He is on medications (Wellbutrin 75 mg qpm-->dizziness, arrhythmias, Dilatiazem 240 mg qd-->bradycardia, severe hypotension, bradycardia, orthostatic hypotension, dizziness; Multaq-->bradycardia.  -negative orthostatics on admission will repeat today  -monitor via telemetry for arrhythmias  -Consider tilt table test in the future  -CE negative x 3   -Consider f/u evaluation with cardiologist Dr. Tillie Rung outpatine t  2. Acute renal failure  -Baseline creatinine 1.3-1.7. On admission 2.50-->2.12.  Etiology could be secondary to decreased oral intake/dehydration prior to admission, patient also uses NSAIDS sporadically.  -will trend BMET   3. Status post fall  -CT head negative, Xray left femur negative, Xray pelvis negative, CT abdomen/pelvis stable changes, CT chest chronic changes  -prn Percocet  -PT recommends walker/OT recommends 3:1 commode, tub seat   4. History of Atrial fibrillation On Coumadin  -INR 1.81 on admission, will f/u INR -Coumadin per pharmacy  -monitor via tele   5. Chronic Thrombocytopenia  -Platelets 128 on admission-->121. Etiology could be secondary to previous alcohol abuse though patient denies recent use   6. Remote Weight loss  -per patient weight fluctuated 210-->150 lbs.  In EPIC patient was at most 205 lbs in 10/2007 now wt 150s-160s since 05/2010). Negative HIV in 05/2010.  Patient has had decreased appetite x 2 years which may be contributing to weight loss.  Normal colonoscopy 10/2010   7. F/E/N  -Hyperkalemia likely secondary to acute renal failure. Resolved.  -Reg Diet   8. DVT px  -SCDS  -Coumadin per pharmacy      Dispo: Disposition  is deferred at this time, awaiting improvement of current medical problems.  Anticipated discharge in approximately 1 day(s).   The patient does have a current PCP [name], MD), therefore will be requiring OPC follow-up after discharge.   The patient does not have transportation limitations that hinder transportation to clinic appointments.  .Services Needed at time of discharge: Y = Yes, Blank = No PT: none  OT: None   RN:   Equipment: Shower bench, walker, 3:1 commode   Other:     LOS: 1 day   Annett Gula [phone] 10/13/2012, 2:23 PM

## 2012-10-13 NOTE — Evaluation (Signed)
Physical Therapy Evaluation Patient Details Name: Travis VANDEVELDE Sr. MRN: [id number] DOB: Sep 27, 1945 Today's Date: 10/13/2012 Time: 4098-1191 PT Time Calculation (min): 17 min  PT Assessment / Plan / Recommendation Clinical Impression  Pt is a 67 y.o. male s/p fall and syncopal episode. Patient currently presents with deficits in functional mobility secondary to pain and decreased activity tolerance. Anticipate patient will progress well as pain becomes controlled. Will continue to see to address stair negotiation in preparation for discharge home. Rec d/c home with intermittant supervision and assist as needed.    PT Assessment  Patient needs continued PT services    Follow Up Recommendations  No PT follow up;Supervision - Intermittent    Does the patient have the potential to tolerate intense rehabilitation      Barriers to Discharge None      Equipment Recommendations  Rolling walker with 5" wheels    Recommendations for Other Services     Frequency Min 3X/week    Precautions / Restrictions Precautions Precautions: Fall Restrictions Weight Bearing Restrictions: No   Pertinent Vitals/Pain 7/10 back and hip pain      Mobility  Bed Mobility Bed Mobility: Supine to Sit;Sitting - Scoot to Edge of Bed Supine to Sit: 5: Supervision Sitting - Scoot to Edge of Bed: 5: Supervision Details for Bed Mobility Assistance: no assist required Transfers Transfers: Sit to Stand;Stand to Sit Sit to Stand: 5: Supervision;From bed Stand to Sit: 5: Supervision;To bed Details for Transfer Assistance: no assist required, increased pain with weightbearing in the hip Ambulation/Gait Ambulation/Gait Assistance: 5: Supervision Ambulation Distance (Feet): 160 Feet Assistive device: Rolling walker (8 ft with quad cane, rw pref for pain) Ambulation/Gait Assistance Details: patient steady with gait, no assist needed using rw, pt unsteady with use of quad cane secondary to increased pain Gait  Pattern: Step-through pattern;Antalgic;Trunk flexed Gait velocity: modestly decreased General Gait Details: steady with ambulation using rw Stairs: No    Exercises     PT Diagnosis: Difficulty walking;Acute pain  PT Problem List: Decreased activity tolerance;Decreased balance;Pain PT Treatment Interventions: DME instruction;Gait training;Stair training;Functional mobility training;Therapeutic activities;Therapeutic exercise;Balance training;Patient/family education   PT Goals Acute Rehab PT Goals PT Goal Formulation: With patient Time For Goal Achievement: 10/27/12 Potential to Achieve Goals: Good Pt will Ambulate: >150 feet;Independently PT Goal: Ambulate - Progress: Goal set today Pt will Go Up / Down Stairs: 3-5 stairs;with supervision PT Goal: Up/Down Stairs - Progress: Goal set today  Visit Information  Last PT Received On: 10/13/12 Assistance Needed: +1    Subjective Data  Subjective: I can't let my bare feet touch this floor Patient Stated Goal: to go home   Prior Functioning  Home Living Lives With: Family Available Help at Discharge: Family;Available PRN/intermittently Type of Home: House Home Access: Stairs to enter [employer] of Steps: 3 Entrance Stairs-Rails: Right Home Layout: One level Bathroom Shower/Tub: Forensic scientist: Standard Home Adaptive Equipment: Quad cane Prior Function Level of Independence: Independent Able to Take Stairs?: Yes Driving: Yes 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 Dominant Hand: Right    Cognition  Cognition Arousal/Alertness: Awake/alert Behavior During Therapy: WFL for tasks assessed/performed    Extremity/Trunk Assessment Right Upper Extremity Assessment RUE ROM/Strength/Tone: WFL for tasks assessed RUE Sensation: History of peripheral neuropathy Left Upper Extremity Assessment LUE ROM/Strength/Tone: WFL for tasks assessed LUE Sensation: History of peripheral  neuropathy Right Lower Extremity Assessment RLE ROM/Strength/Tone: WFL for tasks assessed RLE Sensation: History of peripheral neuropathy Left Lower Extremity Assessment LLE ROM/Strength/Tone:  WFL for tasks assessed LLE Sensation: History of peripheral neuropathy Trunk Assessment Trunk Assessment: Normal   Balance Balance Balance Assessed: Yes Dynamic Sitting Balance Dynamic Sitting - Balance Support: Feet supported;During functional activity Dynamic Sitting - Level of Assistance: 7: Independent Dynamic Sitting - Comments: able to don shoes and socks without any noted loss of balance in sitting Static Standing Balance Static Standing - Balance Support: No upper extremity supported Static Standing - Level of Assistance: 5: Stand by assistance Static Standing - Comment/# of Minutes: 1 minute  End of Session PT - End of Session Equipment Utilized During Treatment: Gait belt Activity Tolerance: Patient limited by pain Patient left: in bed;with call bell/phone within reach;with bed alarm set (seated EOB) Nurse Communication: Mobility status  GP Functional Assessment Tool Used: clinical judgement Functional Limitation: Mobility: Walking and moving around Mobility: Walking and Moving Around Current Status (Z6109): At least 20 percent but less than 40 percent impaired, limited or restricted Mobility: Walking and Moving Around Goal Status [phone]): At least 1 percent but less than 20 percent impaired, limited or restricted   Fabio Asa 10/13/2012, 12:20 PM Charlotte Crumb, PT DPT  [phone]

## 2012-10-14 LAB — BASIC METABOLIC PANEL
BUN: 37 mg/dL — ABNORMAL HIGH (ref 6–23)
CO2: 27 mEq/L (ref 19–32)
Chloride: 106 mEq/L (ref 96–112)
Creatinine, Ser: 1.99 mg/dL — ABNORMAL HIGH (ref 0.50–1.35)
Potassium: 5 mEq/L (ref 3.5–5.1)
Sodium: 141 mEq/L (ref 135–145)

## 2012-10-14 LAB — CBC
HCT: 41.9 % (ref 39.0–52.0)
MCHC: 33.9 g/dL (ref 30.0–36.0)
MCV: 90.7 fL (ref 78.0–100.0)
RBC: 4.62 MIL/uL (ref 4.22–5.81)
RDW: 14.2 % (ref 11.5–15.5)

## 2012-10-14 LAB — VITAMIN B12: Vitamin B-12: 781 pg/mL (ref 211–911)

## 2012-10-14 LAB — PROTIME-INR: Prothrombin Time: 19.7 seconds — ABNORMAL HIGH (ref 11.6–15.2)

## 2012-10-14 MED ORDER — WARFARIN SODIUM 6 MG PO TABS
12.0000 mg | ORAL_TABLET | Freq: Once | ORAL | Status: DC
  Filled 2012-10-14: qty 2

## 2012-10-14 NOTE — Progress Notes (Signed)
D/c orders received; pts IV removed, pt meds and instructions reviewed and given to pt; pt d/c to home, [facility] equipment delivered to pt prior to d/c

## 2012-10-14 NOTE — Progress Notes (Signed)
ANTICOAGULATION CONSULT NOTE - Follow Up Consult  Pharmacy Consult for Coumadin Indication: atrial fibrillation  Patient Measurements: Height: 6\' 3"  (190.5 cm) Weight: 149 lb 4 oz (67.7 kg) IBW/kg (Calculated) : 84.5   Vital Signs: Temp: 98.1 F (36.7 C) (05/28 1245) Temp src: Oral (05/28 1245) BP: 125/70 mmHg (05/28 1245) Pulse Rate: 71 (05/28 1245)   Recent Labs  10/12/12 1215 10/12/12 1817 10/12/12 2305 10/13/12 0532 10/13/12 1418 10/14/12 0517  HGB 16.1  --   --  15.4  --  14.2  HCT 45.2  --   --  43.9  --  41.9  PLT 128*  --   --  121*  --  126*  LABPROT 20.3*  --   --   --  19.3* 19.7*  INR 1.81*  --   --   --  1.69* 1.73*  CREATININE 2.50*  --   --  2.12*  --  1.99*  TROPONINI <0.30 <0.30 <0.30  --   --   --     Estimated Creatinine Clearance: 34 ml/min (by C-G formula based on Cr of 1.99).   Assessment: 67yo M being continued on chronic coumadin for atrial fibrillation. Patient reports home dose as coumadin 10mg  daily except 7.5mg  Tues/Thursday - however he admits missing doses frequently (states he probably misses at least 3 doses in a given 2 week period) and has not followed-up outpatient for some time. Given history of non-adherence, expect patient may possibly require lower maintenence doses during hospital stay. INR remains sub-therapeutic today at 1.73, but patient did miss dose on Monday. Hgb 14.2, Plts 126. No bleeding noted in chart or by RN.   Goal of Therapy:  INR 2-3 Monitor platelets by anticoagulation protocol: Yes   Plan:  1) Coumadin 12 mg x1 tonight 2) Daily INR 3) Continue to monitor signs/symptoms of bleeding   Benjaman Pott, PharmD, BCPS 10/14/2012   1:42 PM

## 2012-10-14 NOTE — Progress Notes (Signed)
Occupational Therapy Treatment Patient Details Name: Travis KIRN Sr. MRN: [id number] DOB: 11/08/1945 Today's Date: 10/14/2012 Time: 1214-1229 OT Time Calculation (min): 15 min  OT Assessment / Plan / Recommendation Comments on Treatment Session Pt has met/partly met goals. Anticipates d/c home today.    Follow Up Recommendations  No OT follow up;Supervision - Intermittent    Barriers to Discharge       Equipment Recommendations  3 in 1 bedside comode;Tub/shower bench    Recommendations for Other Services    Frequency Min 2X/week   Plan Discharge plan remains appropriate;All goals met and education completed, patient discharged from OT services    Precautions / Restrictions Precautions Precautions: Fall Restrictions Weight Bearing Restrictions: No   Pertinent Vitals/Pain See vitals    ADL  Lower Body Dressing: Performed;Modified independent Where Assessed - Lower Body Dressing: Unsupported sit to stand Toilet Transfer: Simulated;Supervision/safety Toilet Transfer Method:  (ambulating) Acupuncturist:  (bed) Equipment Used: Rolling walker Transfers/Ambulation Related to ADLs: ambulating with RW with supervision ADL Comments: Educated pt on use of 3n1 over toilet at home in order to elevate toilet and increased independence/safety with toilet transfers. Also educated pt on use of tub bench. Pt states that he does not think a shower chair would fit in his tub but is agreeable to use of tub bench.      OT Diagnosis:    OT Problem List:   OT Treatment Interventions:     OT Goals ADL Goals Pt Will Transfer to Toilet: with modified independence;Ambulation;with DME ADL Goal: Toilet Transfer - Progress: Partly met Pt Will Perform Tub/Shower Transfer: Tub transfer;with supervision;Ambulation;with DME ADL Goal: Tub/Shower Transfer - Progress: Partly met  Visit Information  Last OT Received On: 10/14/12 Assistance Needed: +1    Subjective Data      Prior  Functioning       Cognition  Cognition Arousal/Alertness: Awake/alert Behavior During Therapy: WFL for tasks assessed/performed Overall Cognitive Status: Within Functional Limits for tasks assessed    Mobility  Bed Mobility Bed Mobility: Supine to Sit;Sitting - Scoot to Edge of Bed;Sit to Supine Supine to Sit: 7: Independent Sitting - Scoot to Edge of Bed: 7: Independent Sit to Supine: 7: Independent Details for Bed Mobility Assistance: no assist required Transfers Transfers: Sit to Stand;Stand to Sit Sit to Stand: 7: Independent;From bed Stand to Sit: 7: Independent;To bed    Exercises      Balance Balance Balance Assessed: Yes Dynamic Sitting Balance Dynamic Sitting - Balance Support: Feet supported;During functional activity Dynamic Sitting - Level of Assistance: 7: Independent Static Standing Balance Static Standing - Balance Support: No upper extremity supported Static Standing - Level of Assistance: 5: Stand by assistance   End of Session OT - End of Session Equipment Utilized During Treatment: Gait belt Activity Tolerance: Patient tolerated treatment well Patient left: in bed;with call bell/phone within reach Nurse Communication: Mobility status  GO Functional Assessment Tool Used: clinical judgement Functional Limitation: Self care Self Care Current Status (Z3086): At least 1 percent but less than 20 percent impaired, limited or restricted Self Care Goal Status (V7846): 0 percent impaired, limited or restricted Self Care Discharge Status [phone]): At least 1 percent but less than 20 percent impaired, limited or restricted  10/14/2012 Cipriano Mile OTR/L Pager [phone] Office [phone]  Cipriano Mile 10/14/2012, 2:02 PM

## 2012-10-14 NOTE — Progress Notes (Signed)
Physical Therapy Treatment Patient Details Name: Travis AZBILL Sr. MRN: [id number] DOB: 03/09/1946 Today's Date: 10/14/2012 Time: 1214-1229 PT Time Calculation (min): 15 min  PT Assessment / Plan / Recommendation Comments on Treatment Session  Pt making continuous progress towards PT goals, patient able to perform stair negotiation safely. Will continue to see as indicated.     Follow Up Recommendations  No PT follow up;Supervision - Intermittent           Equipment Recommendations  Rolling walker with 5" wheels       Frequency Min 3X/week   Plan Discharge plan remains appropriate    Precautions / Restrictions Precautions Precautions: Fall Restrictions Weight Bearing Restrictions: No   Pertinent Vitals/Pain 3/10 pain today    Mobility  Bed Mobility Bed Mobility: Supine to Sit;Sitting - Scoot to Edge of Bed Supine to Sit: 7: Independent Sitting - Scoot to Edge of Bed: 7: Independent Details for Bed Mobility Assistance: no assist required Transfers Transfers: Sit to Stand;Stand to Sit Sit to Stand: 7: Independent Stand to Sit: 7: Independent Ambulation/Gait Ambulation/Gait Assistance: 5: Supervision Ambulation Distance (Feet): 200 Feet Assistive device: Rolling walker Gait Pattern: Step-through pattern;Antalgic;Trunk flexed Gait velocity: modestly decreased General Gait Details: steady with ambulation using rw Stairs: Yes Stairs Assistance: 4: Min guard Stair Management Technique: Two rails;Step to pattern;Forwards Number of Stairs: 6      PT Goals Acute Rehab PT Goals PT Goal Formulation: With patient Time For Goal Achievement: 10/27/12 Potential to Achieve Goals: Good Pt will Ambulate: >150 feet;Independently PT Goal: Ambulate - Progress: Progressing toward goal Pt will Go Up / Down Stairs: 3-5 stairs;with supervision PT Goal: Up/Down Stairs - Progress: Progressing toward goal  Visit Information  Last PT Received On: 10/14/12 Assistance Needed: +1     Subjective Data  Subjective: Im going home Patient Stated Goal: to go home   Cognition  Cognition Arousal/Alertness: Awake/alert Behavior During Therapy: WFL for tasks assessed/performed    Balance  Balance Balance Assessed: Yes Dynamic Sitting Balance Dynamic Sitting - Balance Support: Feet supported;During functional activity Dynamic Sitting - Level of Assistance: 7: Independent Static Standing Balance Static Standing - Balance Support: No upper extremity supported Static Standing - Level of Assistance: 5: Stand by assistance  End of Session PT - End of Session Equipment Utilized During Treatment: Gait belt Activity Tolerance: Patient limited by pain Patient left: in bed;with call bell/phone within reach Nurse Communication: Mobility status   GP     Fabio Asa 10/14/2012, 1:07 PM Charlotte Crumb, PT DPT  [phone]

## 2012-10-14 NOTE — Progress Notes (Signed)
Internal Medicine Teaching Service Attending Note Date: 10/14/2012  Patient name: Travis KEHL Sr.  Medical record number: [id number]  Date of birth: 26-Sep-1945    This patient has been seen and discussed with the house staff. Please see their note for complete details. I concur with their findings with the following additions/corrections: Mr Frasier was feeling well this AM. Slept well. Got lightheaded with standing yesterday but not today. Repeat orthostatics with 3 min btw readings. Mr Papillion knows to stand slowly and wait before moving. F/U B12 level. Home today.   BUTCHER,ELIZABETH 10/14/2012, 10:51 AM

## 2012-10-14 NOTE — Discharge Summary (Signed)
Internal Medicine Teaching [facility] Discharge Note  Name: Travis GRAHAM Sr. MRN: [id number] DOB: 1945-08-12 67 y.o.  Date of Admission: 10/12/2012 10:10 AM Date of Discharge: 10/14/2012 Attending Physician: Burns Spain, MD  Discharge Diagnosis: 1. Presyncope and abnormal gait 2. Acute on chronic renal failure  3. Status post fall  4. History of Atrial fibrillation On Coumadin  5. Chronic Thrombocytopenia  6. Remote Weight loss  7. Hyperkalemia 8. Tobacco abuse   Discharge Medications:   Medication List    TAKE these medications       albuterol 108 (90 BASE) MCG/ACT inhaler  Commonly known as:  PROVENTIL HFA;VENTOLIN HFA  Inhale 2 puffs into the lungs every 6 (six) hours as needed for wheezing or shortness of breath.     buPROPion 75 MG tablet  Commonly known as:  WELLBUTRIN  Take 1 tablet (75 mg total) by mouth every morning.     diltiazem 240 MG 24 hr capsule  Commonly known as:  CARDIZEM CD  Take 240 mg by mouth every morning.     dronedarone 400 MG tablet  Commonly known as:  MULTAQ  Take 400 mg by mouth every morning.     Ensure Plus Liqd  Take 237 mLs by mouth 2 (two) times daily between meals.     omeprazole 20 MG capsule  Commonly known as:  PRILOSEC  Take 20 mg by mouth 2 (two) times daily.     oxyCODONE-acetaminophen 10-325 MG per tablet  Commonly known as:  PERCOCET  Take 1 tablet by mouth every 6 (six) hours as needed for pain.     tiotropium 18 MCG inhalation capsule  Commonly known as:  SPIRIVA  Place 1 capsule (18 mcg total) into inhaler and inhale daily.     warfarin 5 MG tablet  Commonly known as:  COUMADIN  Take 7.5 mg by mouth 2 (two) times a week. Tues and Thurs only     warfarin 10 MG tablet  Commonly known as:  COUMADIN  Take 10 mg by mouth See admin instructions. Sun, Mon, Weds, Fri, and Sat only        Disposition and follow-up:   Travis F Godbee Sr. was discharged from [facility] in stable  condition.  At the hospital follow up visit please address  1) BMET, CBC 2) Medication compliance  3) Assess for falls. Makes referrals if needed for abnormal gait rehab 4) Consider tilt table test in the future  Follow-up Appointments:  Discharge Orders   Future Appointments Provider Department Dept Phone   10/19/2012 3:30 PM [facility] [place] [phone]   10/21/2012 3:30 PM Dede Query, MD [place] [facility] [phone]   10/30/2012 11:45 AM Hillis Range, MD [facility] Main Office [place]) [phone]   03/18/2013 2:00 PM [facility] Main Office [place]) [phone]   Future Orders Complete By Expires     Discharge instructions  As directed     Comments:      1. Please follow up with Dr. Tillie Rung [phone] Cardiology 10/30/12 11:45 am  2. Please follow up with Internal Medicine Clinic [phone] 10/21/12 3:30 PM with Dr. Dierdre Searles 3. Take all medications as prescribed  4. Avoid marijuana 5. 10/19/12 with Dr. Chancy Milroy 3:45 PM [phone]    Increase activity slowly  As directed        Consultations: PT, OT  Procedures Performed:  Ct Abdomen Pelvis Wo Contrast  10/12/2012   *  RADIOLOGY REPORT*  Clinical Data: The patient became dizzy and fell in bathroom.  No head or neck pain.  Hit right side of chest and abdomen.  Recent weight loss.  Elevated creatinine.  CT ABDOMEN AND PELVIS WITHOUT CONTRAST  Technique:  Multidetector CT imaging of the abdomen and pelvis was performed following the standard protocol without intravenous contrast.  Comparison: CT of the chest, abdomen, and pelvis 01/14/2012and earlier  Findings: Pleural based calcifications are noted at the left lung base, associated pleural thickening and rounded atelectasis.  A small cavitary lesion is identified at the right lung base, 8 mm in diameter and stable in appearance since previous exams to 2010.  The patient has a transvenous pacemaker.  No focal  abnormality identified within the liver, spleen, pancreas, or adrenal glands. No focal renal lesion or hydronephrosis.  The gallbladder is present.  The stomach and small bowel loops are normal in appearance. Colonic loops show moderate stool burden, especially within the rectosigmoid colon where there is fecal impaction.  The appendix is present and measures upper limits normal in thickness.  No periappendiceal stranding or fluid identified.  No evidence for aortic aneurysm. No retroperitoneal or mesenteric adenopathy.  There is grade 1 retrolisthesis of L3 on L4, likely degenerative. There is associated significant disc height loss at the same level. There is grade 1 anterolisthesis of L5 on S1, also likely degenerative and related to bilateral pars defects at L5.  No evidence for acute fracture.  The patient has had previous bilateral hip arthroplasty.  IMPRESSION:  1.  Pleural calcifications and rounded atelectasis at the left lung base. 2.  Small cavitary lesion at the right lower lobe, stable since 2010 and consistent with benign process. 3.  Pacemaker. 4.  No evidence for acute injury. 5.  Significant stool burden with rectosigmoid fecal impaction. 6.  Spondylolisthesis as described with bilateral pars defects at L5, chronic.   Original Report Authenticated By: Norva Pavlov, M.D.   Dg Pelvis 1-2 Views  10/12/2012   *RADIOLOGY REPORT*  Clinical Data: Fall.  Pain along the lateral aspects of the left hip.  PELVIS - 1-2 VIEW  Comparison: CT of the abdomen pelvis 10/12/2012, 06/02/2010  Findings: The patient has had bilateral hip arthroplasty.  There is significant heterotopic bone bilaterally.  No acute fracture or subluxation identified.  There is significant stool within the rectosigmoid colon.  IMPRESSION:  1.  Postoperative changes. 2. No evidence for acute  abnormality.   Original Report Authenticated By: Norva Pavlov, M.D.   Dg Femur Left  10/12/2012   *RADIOLOGY REPORT*  Clinical Data: Pain  along the lateral aspect of the left hip. Fall.  LEFT FEMUR - 2 VIEW  Comparison: 04/25/2004  Findings: Patient has had left hip arthroplasty.  There is heterotopic bone lateral to the hip.  No evidence for acute fracture or subluxation.  IMPRESSION: No evidence for acute  abnormality.   Original Report Authenticated By: Norva Pavlov, M.D.   Ct Head Wo Contrast  10/12/2012   *RADIOLOGY REPORT*  Clinical Data:  Dizziness, fall  CT HEAD WITHOUT CONTRAST CT CERVICAL SPINE WITHOUT CONTRAST  Technique:  Multidetector CT imaging of the head and cervical spine was performed following the standard protocol without intravenous contrast.  Multiplanar CT image reconstructions of the cervical spine were also generated.  Comparison:  09/02/2010  CT HEAD  Findings: No evidence of parenchymal hemorrhage or extra-axial fluid collection. No mass lesion, mass effect, or midline shift.  No CT evidence of acute infarction.  Mild cortical atrophy.  No ventriculomegaly.  The visualized paranasal sinuses are essentially clear. The mastoid air cells are unopacified.  No evidence of calvarial fracture.  IMPRESSION: No evidence of acute intracranial abnormality.  CT CERVICAL SPINE  Findings: Normal cervical lordosis.  No evidence of fracture or dislocation.  Vertebral body heights are maintained.  The dens appears intact.  No prevertebral soft tissue swelling.  Moderate multilevel degenerative changes.  Visualized thyroid is mildly heterogeneous.  Visualized lungs are notable for stable left apical pleural parenchymal scarring.  IMPRESSION: No evidence of traumatic injury to the cervical spine.  Moderate multilevel degenerative changes.   Original Report Authenticated By: Charline Bills, M.D.   Ct Chest Wo Contrast  10/12/2012   *RADIOLOGY REPORT*  Clinical Data:  Trauma.  CT CHEST, ABDOMEN AND PELVIS WITHOUT CONTRAST  Technique:  Multidetector CT imaging of the chest, abdomen and pelvis was performed following the standard  protocol without IV contrast.  Comparison:  03/18/2012  CT CHEST  Findings:  The chest wall is unremarkable.  No supraclavicular or axillary mass or adenopathy.  The bony thorax is intact.  No definite rib, vertebral body or sternal fracture.  The heart is normal in size.  No pericardial effusion.  No mediastinal hematoma.  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  The esophagus is grossly normal.  Pacer wires are in place.  Small scattered mediastinal and hilar lymph nodes.  There are surgical changes involving the left lung with loss of volume and dense apical scarring.  Extensive pleural calcifications asymmetrically on the left side.  These are stable.  The right lung is clear of acute process.  Small pulmonary nodules are stable. No pleural effusion.  Stable left basilar scarring.  IMPRESSION:  1.  No acute bony findings. 2.  Chronic lung changes.  No acute pulmonary findings. 3.  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  CT ABDOMEN AND PELVIS  Findings:  The solid abdominal organs are grossly normal without IV contrast.  No obvious acute injury.  The gallbladder is normal.  The stomach, duodenum, small bowel and colon grossly normal without oral contrast.  The appendix is normal.  No mesenteric or retroperitoneal mass, adenopathy or hematoma.  The aorta is normal in caliber.  Moderate atherosclerotic calcifications.  Significant artifact through the lower pelvis due to bilateral hip prosthesis.  Fairly marked fecal impaction is noted in the rectum. The bladder appears normal.  No pelvic mass or large hematoma.  No inguinal mass or adenopathy.  The pubic symphysis and SI joints are intact.  No definite pelvic fractures.  The lumbar vertebral bodies are intact.  There are bilateral pars defects at L5 with a grade 1 spondylolisthesis and advanced degenerative disc disease at L3-4.  IMPRESSION:  1.  No acute abdominal/pelvic findings without IV  or oral contrast. 2.  Intact bony structures. 3.  Fecal impaction in the rectum.   Original Report Authenticated By: Rudie Meyer, M.D.   Ct Cervical Spine Wo Contrast  10/12/2012   *RADIOLOGY REPORT*  Clinical Data:  Dizziness, fall  CT HEAD WITHOUT CONTRAST CT CERVICAL SPINE WITHOUT CONTRAST  Technique:  Multidetector CT imaging of the head and cervical spine was performed following the standard protocol without intravenous contrast.  Multiplanar CT image reconstructions of the cervical spine were also generated.  Comparison:  09/02/2010  CT HEAD  Findings: No evidence of parenchymal hemorrhage or extra-axial fluid collection. No mass lesion, mass effect, or  midline shift.  No CT evidence of acute infarction.  Mild cortical atrophy.  No ventriculomegaly.  The visualized paranasal sinuses are essentially clear. The mastoid air cells are unopacified.  No evidence of calvarial fracture.  IMPRESSION: No evidence of acute intracranial abnormality.  CT CERVICAL SPINE  Findings: Normal cervical lordosis.  No evidence of fracture or dislocation.  Vertebral body heights are maintained.  The dens appears intact.  No prevertebral soft tissue swelling.  Moderate multilevel degenerative changes.  Visualized thyroid is mildly heterogeneous.  Visualized lungs are notable for stable left apical pleural parenchymal scarring.  IMPRESSION: No evidence of traumatic injury to the cervical spine.  Moderate multilevel degenerative changes.   Original Report Authenticated By: Charline Bills, M.D.    2D Echo: none  Cardiac Cath: none   Admission HPI:  Chief Complaint: Lightheadedness, Fall  History of Present Illness:  67 y.o male s/p pacemaker, atrial fibrillation on Coumadin, history of tachycardia-bradycardia syndrome presents for 1 week of positional lightheadedness associated with fall the day prior to admission. At baseline he walks with cane. He was in the shower Sunday night 9:30-10 PM when he fell in the bathroom  hit the back of his right head, right side of back, and left hip. Hip pain was 7.5-8/10 after morphine 4 mg x 2 given in the ED. Hip pain was worse with movement. He denies loss of consciousness, vertigo, hearing loss, tinnitis. He has been having palpitations, piercing pain in left chest radiating to left back. Of note chest pain is chronic and intermittent for years; most recently experienced chest pain Friday rates 9/10, Saturday rate 8/10, Monday rates 7/10. Todays episode of chest pain was around 3:15 PM; he takes Oxycodone which helps normally, chest pain is worse with movement but not with breathing. He denies shortness of breath.   Review of Systems:  General: +subjective fever and chills Friday and Saturday prior to admission, +decreased energy, +decreased appetite x >2 years, denies sick contacts, +weight loss (per patient 210-->150 lbs in EPIC patient was at most 205 lbs in 10/2007 now wt 150s-160s since 05/2010)  HEENT: denies sore throat, dysphagia, hearing loss, tinnitis  Cardiac: +palpitations, +piercing pain left chest to left back (chest pain chronic and intermittent for years; most recently experienced Friday 9/10, Saturday 8/10, Monday 7/10. Episode of chest pain today was around 3:15 PM; he takes Oxycodone which helps normally, pain is worse with movement but not with breathing  Pulm: denies sob, +chronic cough with white/browin phlegm x 6 months at least  Abd/GU: +nauseated Saturday prior to admission and spiting phlegm; denies vomiting, denies diarrhea; denies blood in urine/stool  Ext: +chronic leg swelling  Neuro: +lightheadedness x 1 week, +positional lightheadedness, +baseline walks with cane, +fall Sunday night 9:30-10 PM in the bathroom hit his occipital right head, right side of back, left hip. Hip pain was 7.5-8/10 after morphine; hip pain worse with movement, Denies loss of consciousness, denies vertigo  Psych: +depression (triggers life stressors)   Physical Exam:  Blood  pressure 128/76, pulse 82, temperature 98.3 F (36.8 C), temperature source Oral, resp. rate 20, weight 151 lb 0.2 oz (68.5 kg), SpO2 98.00%.  Vitals reviewed on exam: HR 59/18/128/83 (90)  General: resting in bed, NAD, alert and oriented x 3  HEENT: [place]/at, wet oral mucosa, no teeth  Cardiac: RRR, no rubs, murmurs or gallops  Pulm: clear to auscultation bilaterally, no wheezes, rales, or rhonchi  Abd: soft, mild epigastric ttp, nondistended, hypoactive BS present  Ext: warm and  well perfused, no pedal edema  Neuro: alert and oriented X3, grossly neurologically intact, moving all 4 extremities   Hospital Course by problem list: 1. Presyncope and abnormal gait 2. Acute on chronic renal failure  3. Status post fall  4. History of Atrial fibrillation On Coumadin  5. Chronic Thrombocytopenia  6. Remote Weight loss  7. Hyperkalemia 8. Tobacco abuse      67 y.o male status post pacemaker, atrial fibrillation on Coumadin (intermittently compliant), history of tachycardia-bradycardia syndrome presents for 1 week of positional lightheadedness associated with fall the day prior to admission.   1. Presyncope and abnormal gait Etiology may be multifactorial.  Initially etiology thought to be due to arrhythmia as patient has a history of tachycardia-bradycardia syndrome status post pacemaker. Cardiac enzymes negative x 3.  His pacemaker was just interogatted in 08/2012 and was normal. He was initially bradycardic on admission.  Telemetry has been negative this admission.  Etiology may be neurocardiogenic (i.e vasovagal) as he has had decreased oral intake due to decreased appetite prior to admission.  He was found to be orthostatic by heart rate criteria this admission (second set of orthostatics with elevation in HR 59 lying to 78 standing.  Medications such as Diltiazem may cause orthostatic hypotension. He is also on medications (Wellbutrin 75 mg which may cause dizziness, arrhythmias and Dilatiazem  240 mg daily which can lead to bradycardia, severe hypotension,  dizziness.  He was also taking Multaq which can lead to bradycardia.  He was also positive for marijuana on admission which can cause his symptoms of positional lightheadedness.  Combination of marijuana and Multaq can increase the levels of Multaq leading to dizziness, vasodilation and symptoms that this patient experienced.  He also is older which may make him more prone to orthostatic hypotension.  He is aware of safety in standing up and what he should do and we sent him home with home health equipment.  Consider tilt table test in the future.  He will follow up with Internal Medicine 10/21/12 at 3:30 PM and his cardiologist Dr. Tillie Rung outpatient.   2. Acute on chronic renal failure  Baseline creatinine 1.5-1.7. On admission 2.50-->2.12>>1.99. Etiology could be secondary to decreased oral intake/dehydration prior to admission, patient also uses NSAIDS sporadically. Follow up BMET outpatient.    3. Status post fall  CT head negative, Xray left femur negative, Xray pelvis negative, CT abdomen/pelvis stable changes, CT chest chronic changes.  We resumed his Percocet as needed and he was evaluated by PT recommends walker/OT recommends 3:1 commode, tub seat.  He had those supplies prior to discharge.    4. History of Chronic Atrial fibrillation On Coumadin  Patient in intermittently compliant with Coumadin outpatient.  His INR on admission was 1.81>>1.73 by discharge.  We ordered Coumadin per pharmacy and he will follow up with Dr. Alexandria Lodge 10/19/12 3:45 pm.   5. Chronic Thrombocytopenia  Platelets 128 on admission>>121>>126. Etiology could be secondary to previous alcohol abuse though patient denies recent use.    6. Remote Weight loss  Per patient weight fluctuated 210-->150 lbs over 2 years though in EPIC patient was at most 205 lbs in 10/2007 now weight 150s-160s since 05/2010). Negative HIV in 05/2010. Patient has had decreased appetite x 2  years which may be contributing to weight loss. Normal colonoscopy 10/2010.  Further workup should be pursued outpatient if needed.   7. Hyperkalemia Likely due to acute on chronic renal failure. On admission 5.7 decreased to 5.0 by day of  discharge.  He was given Kayexylate 30 grams this admission.   8. Tobacco abuse  He was counseled and given a Nicotine 21 patch this admission.   He was on sequential compression devices and Coumadin per pharmacy for DVT prophylaxis this admission.     Discharge Vitals:  BP 125/70  Pulse 71  Temp(Src) 98.1 F (36.7 C) (Oral)  Resp 18  Ht 6\' 3"  (1.905 m)  Wt 149 lb 4 oz (67.7 kg)  BMI 18.66 kg/m2  SpO2 100%  Discharge physical exam:  General: resting in bed, NAD, alert and oriented x 3  HEENT: [place]/at, no scleral icterus  Cardiac: RRR, no rubs, murmurs or gallops  Pulm: clear to auscultation bilaterally, no wheezes, rales, or rhonchi  Abd: soft, nontender, nondistended, BS present  Ext: warm and well perfused, no pedal edema  Neuro: alert and oriented X3, grossly neurologically intact moving al 4 extremities  Skin: clavi to b/l feet L>R  Discharge Labs:  Results for ZINEDINE, ELLNER (MRN [id number]) as of 10/15/2012 13:55  Ref. Range 10/12/2012 15:52 10/12/2012 18:17 10/12/2012 23:05 10/13/2012 05:32 10/14/2012 05:17  Sodium Latest Range: 135-145 mEq/L    138 141  Potassium Latest Range: 3.5-5.1 mEq/L    4.5 5.0  Chloride Latest Range: 96-112 mEq/L    103 106  CO2 Latest Range: 19-32 mEq/L    22 27  BUN Latest Range: 6-23 mg/dL    43 (H) 37 (H)  Creatinine Latest Range: 0.50-1.35 mg/dL    4.09 (H) 8.11 (H)  Calcium Latest Range: 8.4-10.5 mg/dL    9.1 9.2  GFR calc non Af Amer Latest Range: >90 mL/min    30 (L) 33 (L)  GFR calc Af Amer Latest Range: >90 mL/min    35 (L) 38 (L)  Glucose Latest Range: 70-99 mg/dL    88 82  Magnesium Latest Range: 1.5-2.5 mg/dL 2.1      Alkaline Phosphatase Latest Range: 39-117 U/L 66      Albumin Latest Range:  3.5-5.2 g/dL 3.7      AST Latest Range: 0-37 U/L 19      ALT Latest Range: 0-53 U/L 9      Total Protein Latest Range: 6.0-8.3 g/dL 7.1      Bilirubin, Direct Latest Range: 0.0-0.3 mg/dL <9.1      Indirect Bilirubin Latest Range: 0.3-0.9 mg/dL NOT CALCULATED      Total Bilirubin Latest Range: 0.3-1.2 mg/dL 0.4      Troponin I Latest Range: <0.30 ng/mL  <0.30 <0.30    Folate No range found     11.1  Vitamin B-12 Latest Range: 211-911 pg/mL     781   Results for KIAN, OTTAVIANO SR. (MRN [id number]) as of 10/15/2012 13:55  Ref. Range 10/12/2012 12:15  Sodium Latest Range: 135-145 mEq/L 139  Potassium Latest Range: 3.5-5.1 mEq/L 5.7 (H)  Chloride Latest Range: 96-112 mEq/L 105  CO2 Latest Range: 19-32 mEq/L 24  BUN Latest Range: 6-23 mg/dL 44 (H)  Creatinine Latest Range: 0.50-1.35 mg/dL 3.08 (H)  Calcium Latest Range: 8.4-10.5 mg/dL 9.5  GFR calc non Af Amer Latest Range: >90 mL/min 25 (L)  GFR calc Af Amer Latest Range: >90 mL/min 29 (L)  Glucose Latest Range: 70-99 mg/dL 89  Troponin I Latest Range: <0.30 ng/mL <0.30  Results for AMER, ALCINDOR (MRN [id number]) as of 10/15/2012 13:55  Ref. Range 10/12/2012 15:52  Magnesium Latest Range: 1.5-2.5 mg/dL 2.1  Alkaline Phosphatase Latest Range: 39-117 U/L 66  Albumin Latest Range: 3.5-5.2 g/dL 3.7  AST Latest Range: 0-37 U/L 19  ALT Latest Range: 0-53 U/L 9  Total Protein Latest Range: 6.0-8.3 g/dL 7.1  Bilirubin, Direct Latest Range: 0.0-0.3 mg/dL <1.6  Indirect Bilirubin Latest Range: 0.3-0.9 mg/dL NOT CALCULATED  Total Bilirubin Latest Range: 0.3-1.2 mg/dL 0.4   Results for NICKOLAS, CHALFIN (MRN [id number]) as of 10/15/2012 13:55  Ref. Range 10/12/2012 12:15 10/12/2012 18:17 10/12/2012 23:05  Troponin I Latest Range: <0.30 ng/mL <0.30 <0.30 <0.30    Results for ASSAD, HARBESON (MRN [id number]) as of 10/15/2012 13:55  Ref. Range 10/14/2012 05:17  Folate No range found 11.1    Results for HUNTER, BACHAR (MRN [id number]) as  of 10/15/2012 13:55  Ref. Range 10/14/2012 05:17  Vitamin B-12 Latest Range: 211-911 pg/mL 781   Results for YARDLEY, LEKAS SR. (MRN [id number]) as of 10/15/2012 13:55  Ref. Range 10/12/2012 12:15 10/13/2012 05:32 10/14/2012 05:17  WBC Latest Range: 4.0-10.5 K/uL 8.5 7.5 8.1  RBC Latest Range: 4.22-5.81 MIL/uL 4.98 4.86 4.62  Hemoglobin Latest Range: 13.0-17.0 g/dL 95.2 84.1 32.4  HCT Latest Range: 39.0-52.0 % 45.2 43.9 41.9  MCV Latest Range: 78.0-100.0 fL 90.8 90.3 90.7  MCH Latest Range: 26.0-34.0 pg 32.3 31.7 30.7  MCHC Latest Range: 30.0-36.0 g/dL 40.1 02.7 25.3  RDW Latest Range: 11.5-15.5 % 14.3 14.4 14.2  Platelets Latest Range: 150-400 K/uL 128 (L) 121 (L) 126 (L)  Neutrophils Relative % Latest Range: 43-77 % 65 51   Lymphocytes Relative Latest Range: 12-46 % 26 36   Monocytes Relative Latest Range: 3-12 % 7 9   Eosinophils Relative Latest Range: 0-5 % 2 4   Basophils Relative Latest Range: 0-1 % 0 0   NEUT# Latest Range: 1.7-7.7 K/uL 5.6 3.8   Lymphocytes Absolute Latest Range: 0.7-4.0 K/uL 2.2 2.7   Monocytes Absolute Latest Range: 0.1-1.0 K/uL 0.6 0.7   Eosinophils Absolute Latest Range: 0.0-0.7 K/uL 0.2 0.3   Basophils Absolute Latest Range: 0.0-0.1 K/uL 0.0 0.0    Results for SYLAS, TWOMBLY (MRN [id number]) as of 10/15/2012 13:55  Ref. Range 10/12/2012 12:15 10/13/2012 14:18 10/14/2012 05:17  Prothrombin Time Latest Range: 11.6-15.2 seconds 20.3 (H) 19.3 (H) 19.7 (H)  INR Latest Range: 0.00-1.49  1.81 (H) 1.69 (H) 1.73 (H)   Results for DELOSS, AMICO (MRN [id number]) as of 10/15/2012 13:55  Ref. Range 10/12/2012 17:24  Color, Urine Latest Range: YELLOW  YELLOW  APPearance Latest Range: CLEAR  CLEAR  Specific Gravity, Urine Latest Range: 1.005-1.030  1.026  pH Latest Range: 5.0-8.0  5.5  Glucose Latest Range: NEGATIVE mg/dL NEGATIVE  Bilirubin Urine Latest Range: NEGATIVE  SMALL (A)  Ketones, ur Latest Range: NEGATIVE mg/dL NEGATIVE  Protein Latest Range: NEGATIVE  mg/dL NEGATIVE  Urobilinogen, UA Latest Range: 0.0-1.0 mg/dL 1.0  Nitrite Latest Range: NEGATIVE  NEGATIVE  Leukocytes, UA Latest Range: NEGATIVE  SMALL (A)  Hgb urine dipstick Latest Range: NEGATIVE  NEGATIVE  WBC, UA Latest Range: <3 WBC/hpf 3-6  Squamous Epithelial / LPF Latest Range: RARE  MANY (A)  Sodium, Ur No range found 89   Results for BLAYNE, FRANKIE SR. (MRN [id number]) as of 10/15/2012 13:55  Ref. Range 10/12/2012 15:52 10/12/2012 17:24  Alcohol, Ethyl (B) Latest Range: 0-11 mg/dL <84   Amphetamines Latest Range: NONE DETECTED   NONE DETECTED  Barbiturates Latest Range: NONE DETECTED   NONE DETECTED  Benzodiazepines Latest Range: NONE DETECTED   NONE DETECTED  Opiates  Latest Range: NONE DETECTED   POSITIVE (A)  COCAINE Latest Range: NONE DETECTED   NONE DETECTED  Tetrahydrocannabinol Latest Range: NONE DETECTED   POSITIVE (A)    No results found for this or any previous visit (from the past 24 hour(s)).  Signed: Annett Gula 10/16/2012, 12:43 PM   Time Spent on Discharge: >30 minutes  Services Ordered on Discharge: none Equipment Ordered on Discharge: rolling walker, shower bench, 3:1 commode

## 2012-10-14 NOTE — Progress Notes (Addendum)
Subjective: Patient denies complaints.  States he was lightheaded with orthostatics yesterday.  Otherwise enjoyed breakfast and ready to go home.   Objective: Vital signs in last 24 hours: Filed Vitals:   10/13/12 2100 10/14/12 0500 10/14/12 0503 10/14/12 0506  BP: 103/67 103/70 119/77 121/83  Pulse: 68 64 74 86  Temp: 98.2 F (36.8 C) 98.4 F (36.9 C)    TempSrc:      Resp: 16 16    Height:      Weight:    149 lb 4 oz (67.7 kg)  SpO2: 98% 97%     Weight change: -1 lb 12.2 oz (-0.8 kg)  Intake/Output Summary (Last 24 hours) at 10/14/12 0839 Last data filed at 10/14/12 0500  Gross per 24 hour  Intake    603 ml  Output    750 ml  Net   -147 ml   Vitals reviewed. General: resting in bed, NAD, alert and oriented x 3 HEENT: Sharon/at, no scleral icterus Cardiac: RRR, no rubs, murmurs or gallops Pulm: clear to auscultation bilaterally, no wheezes, rales, or rhonchi Abd: soft, nontender, nondistended, BS present Ext: warm and well perfused, no pedal edema Neuro: alert and oriented X3, grossly neurologically intact moving al 4 extremities  Skin: clavi to b/l feet L>R  Lab Results: Basic Metabolic Panel:  Recent Labs Lab 10/12/12 1552 10/13/12 0532 10/14/12 0517  NA  --  138 141  K  --  4.5 5.0  CL  --  103 106  CO2  --  22 27  GLUCOSE  --  88 82  BUN  --  43* 37*  CREATININE  --  2.12* 1.99*  CALCIUM  --  9.1 9.2  MG 2.1  --   --    Liver Function Tests:  Recent Labs Lab 10/12/12 1552  AST 19  ALT 9  ALKPHOS 66  BILITOT 0.4  PROT 7.1  ALBUMIN 3.7   CBC:  Recent Labs Lab 10/12/12 1215 10/13/12 0532 10/14/12 0517  WBC 8.5 7.5 8.1  NEUTROABS 5.6 3.8  --   HGB 16.1 15.4 14.2  HCT 45.2 43.9 41.9  MCV 90.8 90.3 90.7  PLT 128* 121* 126*   Cardiac Enzymes:  Recent Labs Lab 10/12/12 1215 10/12/12 1817 10/12/12 2305  TROPONINI <0.30 <0.30 <0.30   Coagulation:  Recent Labs Lab 10/12/12 1215 10/13/12 1418 10/14/12 0517  LABPROT 20.3* 19.3*  19.7*  INR 1.81* 1.69* 1.73*   Urine Drug Screen: Drugs of Abuse     Component Value Date/Time   LABOPIA POSITIVE* 10/12/2012 1724   LABOPIA NEGATIVE 12/27/2008 1943   COCAINSCRNUR NONE DETECTED 10/12/2012 1724   COCAINSCRNUR NEG 07/06/2009 2225   LABBENZ NONE DETECTED 10/12/2012 1724   LABBENZ NEG 07/06/2009 2225   AMPHETMU NONE DETECTED 10/12/2012 1724   AMPHETMU NEG 07/06/2009 2225   THCU POSITIVE* 10/12/2012 1724   LABBARB NONE DETECTED 10/12/2012 1724    Alcohol Level:  Recent Labs Lab 10/12/12 1552  ETH <11   Urinalysis:  Recent Labs Lab 10/12/12 1724  COLORURINE YELLOW  LABSPEC 1.026  PHURINE 5.5  GLUCOSEU NEGATIVE  HGBUR NEGATIVE  BILIRUBINUR SMALL*  KETONESUR NEGATIVE  PROTEINUR NEGATIVE  UROBILINOGEN 1.0  NITRITE NEGATIVE  LEUKOCYTESUR SMALL*   Misc. Labs: Folate  B12   Micro Results: No results found for this or any previous visit (from the past 240 hour(s)). Studies/Results: Ct Abdomen Pelvis Wo Contrast  10/12/2012   *RADIOLOGY REPORT*  Clinical Data: The patient became dizzy and fell in  bathroom.  No head or neck pain.  Hit right side of chest and abdomen.  Recent weight loss.  Elevated creatinine.  CT ABDOMEN AND PELVIS WITHOUT CONTRAST  Technique:  Multidetector CT imaging of the abdomen and pelvis was performed following the standard protocol without intravenous contrast.  Comparison: CT of the chest, abdomen, and pelvis 01/14/2012and earlier  Findings: Pleural based calcifications are noted at the left lung base, associated pleural thickening and rounded atelectasis.  A small cavitary lesion is identified at the right lung base, 8 mm in diameter and stable in appearance since previous exams to 2010.  The patient has a transvenous pacemaker.  No focal abnormality identified within the liver, spleen, pancreas, or adrenal glands. No focal renal lesion or hydronephrosis.  The gallbladder is present.  The stomach and small bowel loops are normal in appearance.  Colonic loops show moderate stool burden, especially within the rectosigmoid colon where there is fecal impaction.  The appendix is present and measures upper limits normal in thickness.  No periappendiceal stranding or fluid identified.  No evidence for aortic aneurysm. No retroperitoneal or mesenteric adenopathy.  There is grade 1 retrolisthesis of L3 on L4, likely degenerative. There is associated significant disc height loss at the same level. There is grade 1 anterolisthesis of L5 on S1, also likely degenerative and related to bilateral pars defects at L5.  No evidence for acute fracture.  The patient has had previous bilateral hip arthroplasty.  IMPRESSION:  1.  Pleural calcifications and rounded atelectasis at the left lung base. 2.  Small cavitary lesion at the right lower lobe, stable since 2010 and consistent with benign process. 3.  Pacemaker. 4.  No evidence for acute injury. 5.  Significant stool burden with rectosigmoid fecal impaction. 6.  Spondylolisthesis as described with bilateral pars defects at L5, chronic.   Original Report Authenticated By: Norva Pavlov, M.D.   Dg Pelvis 1-2 Views  10/12/2012   *RADIOLOGY REPORT*  Clinical Data: Fall.  Pain along the lateral aspects of the left hip.  PELVIS - 1-2 VIEW  Comparison: CT of the abdomen pelvis 10/12/2012, 06/02/2010  Findings: The patient has had bilateral hip arthroplasty.  There is significant heterotopic bone bilaterally.  No acute fracture or subluxation identified.  There is significant stool within the rectosigmoid colon.  IMPRESSION:  1.  Postoperative changes. 2. No evidence for acute  abnormality.   Original Report Authenticated By: Norva Pavlov, M.D.   Dg Femur Left  10/12/2012   *RADIOLOGY REPORT*  Clinical Data: Pain along the lateral aspect of the left hip. Fall.  LEFT FEMUR - 2 VIEW  Comparison: 04/25/2004  Findings: Patient has had left hip arthroplasty.  There is heterotopic bone lateral to the hip.  No evidence for  acute fracture or subluxation.  IMPRESSION: No evidence for acute  abnormality.   Original Report Authenticated By: Norva Pavlov, M.D.   Ct Head Wo Contrast  10/12/2012   *RADIOLOGY REPORT*  Clinical Data:  Dizziness, fall  CT HEAD WITHOUT CONTRAST CT CERVICAL SPINE WITHOUT CONTRAST  Technique:  Multidetector CT imaging of the head and cervical spine was performed following the standard protocol without intravenous contrast.  Multiplanar CT image reconstructions of the cervical spine were also generated.  Comparison:  09/02/2010  CT HEAD  Findings: No evidence of parenchymal hemorrhage or extra-axial fluid collection. No mass lesion, mass effect, or midline shift.  No CT evidence of acute infarction.  Mild cortical atrophy.  No ventriculomegaly.  The visualized paranasal sinuses  are essentially clear. The mastoid air cells are unopacified.  No evidence of calvarial fracture.  IMPRESSION: No evidence of acute intracranial abnormality.  CT CERVICAL SPINE  Findings: Normal cervical lordosis.  No evidence of fracture or dislocation.  Vertebral body heights are maintained.  The dens appears intact.  No prevertebral soft tissue swelling.  Moderate multilevel degenerative changes.  Visualized thyroid is mildly heterogeneous.  Visualized lungs are notable for stable left apical pleural parenchymal scarring.  IMPRESSION: No evidence of traumatic injury to the cervical spine.  Moderate multilevel degenerative changes.   Original Report Authenticated By: Charline Bills, M.D.   Ct Chest Wo Contrast  10/12/2012   *RADIOLOGY REPORT*  Clinical Data:  Trauma.  CT CHEST, ABDOMEN AND PELVIS WITHOUT CONTRAST  Technique:  Multidetector CT imaging of the chest, abdomen and pelvis was performed following the standard protocol without IV contrast.  Comparison:  03/18/2012  CT CHEST  Findings:  The chest wall is unremarkable.  No supraclavicular or axillary mass or adenopathy.  The bony thorax is intact.  No definite rib,  vertebral body or sternal fracture.  The heart is normal in size.  No pericardial effusion.  No mediastinal hematoma.  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  The esophagus is grossly normal.  Pacer wires are in place.  Small scattered mediastinal and hilar lymph nodes.  There are surgical changes involving the left lung with loss of volume and dense apical scarring.  Extensive pleural calcifications asymmetrically on the left side.  These are stable.  The right lung is clear of acute process.  Small pulmonary nodules are stable. No pleural effusion.  Stable left basilar scarring.  IMPRESSION:  1.  No acute bony findings. 2.  Chronic lung changes.  No acute pulmonary findings. 3.  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  CT ABDOMEN AND PELVIS  Findings:  The solid abdominal organs are grossly normal without IV contrast.  No obvious acute injury.  The gallbladder is normal.  The stomach, duodenum, small bowel and colon grossly normal without oral contrast.  The appendix is normal.  No mesenteric or retroperitoneal mass, adenopathy or hematoma.  The aorta is normal in caliber.  Moderate atherosclerotic calcifications.  Significant artifact through the lower pelvis due to bilateral hip prosthesis.  Fairly marked fecal impaction is noted in the rectum. The bladder appears normal.  No pelvic mass or large hematoma.  No inguinal mass or adenopathy.  The pubic symphysis and SI joints are intact.  No definite pelvic fractures.  The lumbar vertebral bodies are intact.  There are bilateral pars defects at L5 with a grade 1 spondylolisthesis and advanced degenerative disc disease at L3-4.  IMPRESSION:  1.  No acute abdominal/pelvic findings without IV or oral contrast. 2.  Intact bony structures. 3.  Fecal impaction in the rectum.   Original Report Authenticated By: Rudie Meyer, M.D.   Ct Cervical Spine Wo Contrast  10/12/2012   *RADIOLOGY REPORT*   Clinical Data:  Dizziness, fall  CT HEAD WITHOUT CONTRAST CT CERVICAL SPINE WITHOUT CONTRAST  Technique:  Multidetector CT imaging of the head and cervical spine was performed following the standard protocol without intravenous contrast.  Multiplanar CT image reconstructions of the cervical spine were also generated.  Comparison:  09/02/2010  CT HEAD  Findings: No evidence of parenchymal hemorrhage or extra-axial fluid collection. No mass lesion, mass effect, or midline shift.  No CT evidence of acute infarction.  Mild  cortical atrophy.  No ventriculomegaly.  The visualized paranasal sinuses are essentially clear. The mastoid air cells are unopacified.  No evidence of calvarial fracture.  IMPRESSION: No evidence of acute intracranial abnormality.  CT CERVICAL SPINE  Findings: Normal cervical lordosis.  No evidence of fracture or dislocation.  Vertebral body heights are maintained.  The dens appears intact.  No prevertebral soft tissue swelling.  Moderate multilevel degenerative changes.  Visualized thyroid is mildly heterogeneous.  Visualized lungs are notable for stable left apical pleural parenchymal scarring.  IMPRESSION: No evidence of traumatic injury to the cervical spine.  Moderate multilevel degenerative changes.   Original Report Authenticated By: Charline Bills, M.D.   Medications:  Scheduled Meds: . buPROPion  75 mg Oral q morning - 10a  . diltiazem  240 mg Oral q morning - 10a  . dronedarone  400 mg Oral q morning - 10a  . Ensure Plus  237 mL Oral BID BM  . multivitamin with minerals  1 tablet Oral Daily  . nicotine  21 mg Transdermal Daily  . pantoprazole  40 mg Oral Daily  . sodium chloride  3 mL Intravenous Q12H  . sodium chloride  3 mL Intravenous Q12H  . tiotropium  18 mcg Inhalation Daily  . Warfarin - Pharmacist Dosing Inpatient   Does not apply q1800   Continuous Infusions:  PRN Meds:.sodium chloride, albuterol, ondansetron (ZOFRAN) IV, oxyCODONE-acetaminophen, sodium  chloride Assessment/Plan: 67 y.o male s/p pacemaker, atrial fibrillation on Coumadin, history of tachycardia-bradycardia syndrome presents for 1 week of positional lightheadedness associated with fall the day prior to admission.   1. Presyncope  -Etiology could be arrhythmia as patient has a history of tachycardia-bradycardia syndrome s/p pacemaker.  His pacemaker was just interogatted in 08/2012 and was normal.  Telemetry has been negative.  Etiology may be neurocardiogenic (i.e vasovagal) as he has had decreased oral intake due to decreased appetite prior to admission.  Another etiology could be orthostatic hypotension due to medications such as Diltiazem). He is on medications (Wellbutrin 75 mg qpm-->dizziness, arrhythmias, Dilatiazem 240 mg qd-->bradycardia, severe hypotension, bradycardia, orthostatic hypotension, dizziness; Multaq-->bradycardia.  -negative orthostatics on admission and repeat showed elevation in HR 59 lying to 78 standing -Consider tilt table test in the future  -CE negative x 3   -Consider f/u evaluation with cardiologist Dr. Tillie Rung outpatinet  2. Acute on chronic renal failure  -Baseline creatinine 1.5-1.7. On admission 2.50-->2.12>>1.99.  Etiology could be secondary to decreased oral intake/dehydration prior to admission, patient also uses NSAIDS sporadically.  -will trend BMET   3. Status post fall  -CT head negative, Xray left femur negative, Xray pelvis negative, CT abdomen/pelvis stable changes, CT chest chronic changes  -prn Percocet  -PT recommends walker/OT recommends 3:1 commode, tub seat   4. History of Atrial fibrillation On Coumadin  -INR 1.81 on admission now 1.73.   -Coumadin per pharmacy  -monitor via tele   5. Chronic Thrombocytopenia  -Platelets 128 on admission-->121>>126. Etiology could be secondary to previous alcohol abuse though patient denies recent use   6. Remote Weight loss  -per patient weight fluctuated 210-->150 lbs.  In EPIC patient  was at most 205 lbs in 10/2007 now wt 150s-160s since 05/2010). Negative HIV in 05/2010. Patient has had decreased appetite x 2 years which may be contributing to weight loss.  Normal colonoscopy 10/2010   7. F/E/N   -Reg Diet   8. DVT px  -SCDS  -Coumadin per pharmacy      Dispo: likely d/c today  with outpatient f/u.    The patient does have a current PCP [name], MD), therefore will be requiring OPC follow-up after discharge.   The patient does not have transportation limitations that hinder transportation to clinic appointments.  .Services Needed at time of discharge: Y = Yes, Blank = No PT: none  OT: None   RN:   Equipment: Shower bench, walker, 3:1 commode   Other:     LOS: 2 days   Annett Gula [phone] 10/14/2012, 8:39 AM

## 2012-10-19 ENCOUNTER — Ambulatory Visit: Payer: Medicare Other

## 2012-10-21 ENCOUNTER — Encounter: Payer: Self-pay | Admitting: Internal Medicine

## 2012-10-21 ENCOUNTER — Ambulatory Visit: Payer: Medicare Other

## 2012-10-21 ENCOUNTER — Ambulatory Visit (INDEPENDENT_AMBULATORY_CARE_PROVIDER_SITE_OTHER): Payer: Medicare Other | Admitting: Internal Medicine

## 2012-10-21 VITALS — BP 136/88 | HR 78 | Temp 97.5°F | Wt 161.2 lb

## 2012-10-21 DIAGNOSIS — Z7901 Long term (current) use of anticoagulants: Secondary | ICD-10-CM

## 2012-10-21 DIAGNOSIS — L84 Corns and callosities: Secondary | ICD-10-CM

## 2012-10-21 DIAGNOSIS — R42 Dizziness and giddiness: Secondary | ICD-10-CM

## 2012-10-21 DIAGNOSIS — N179 Acute kidney failure, unspecified: Secondary | ICD-10-CM

## 2012-10-21 LAB — BASIC METABOLIC PANEL WITH GFR
BUN: 25 mg/dL — ABNORMAL HIGH (ref 6–23)
CO2: 23 mEq/L (ref 19–32)
Chloride: 110 mEq/L (ref 96–112)
Creat: 2 mg/dL — ABNORMAL HIGH (ref 0.50–1.35)

## 2012-10-21 NOTE — Progress Notes (Signed)
POCT INR results called to the voicemail of Dr Juleen Starr  [phone] at 1621p on 10-21-2012.  Patient's phone number given to Dr Alexandria Lodge. INR of 2.6  Alric Quan, PBT

## 2012-10-21 NOTE — Patient Instructions (Addendum)
1. Will check your BMP and INR today 2. Will send referral to your podiatry for your severe feet callous-        -your appt is at 230 pm at [facility] on 10/27/12. 3. Will hold on your PT/OT referral until your Callous are removed 4. Follow up with Dr. Meredith Pel in 1-2  months

## 2012-10-21 NOTE — Progress Notes (Signed)
Subjective:   Patient ID: Travis HAYCRAFT Sr. male   DOB: 10/26/1945 67 y.o.   MRN: [id number]  HPI: [name] is a 67 y.o. man with PMH of status post pacemaker, atrial fibrillation/DVT on Coumadin, history for tachycardia-bradycardia syndrome, CAD, chronic systolic heart failure, hypertension, depression, who presents to the clinic for hospital follow up.   Patient was recently admitted for evaluation of lightheadedness and fall on 10/12/12.  His presyncope was thought to be related to orthostasis in the setting of marijuana abuse and possible drug interaction between marijuana and his home medications.  He was noted to have multiple moderate to severe calluses on both feet, which could contribute to his unsteady gait. Patient was discharged home on 10/14/12. He is also scheduled to see Dr. Tillie Rung on 10/30/12.  Patient also reports that his narcotic bottle was missing.  Of note he lives at home with his family who states that they did not see it.  He understands the pain contact of not refill the narcotics prior to the refill date if it is" missing".    Past Medical History  Diagnosis Date  . Hypertension   . Renal insufficiency     Renal U/S 12/04/2009 showed no pathological findings. Labs 12/04/2009 include normal ESR, C3, C4; neg ANA; SPEP showed nonspecific increase in the alpha-2 region with no M-spike; UPEP showed no monoclonal free light chains; urine IFE showed polyclonal increase in feree Kappa and/or free Lambda light chains.  . Chronic systolic heart failure   . Depression   . GERD (gastroesophageal reflux disease)   . Portal vein thrombosis   . Avascular necrosis     right hip s/p replacement  . Gastritis   . Alcohol abuse   . Erectile dysfunction   . Bipolar disorder   . Hydrocele, unspecified   . Intermittent claudication   . Edema   . Lung nodule     Chest CT scan on 12/14/2008 showed a nodular opacity at the left lung base felt to most likely represent scarring.   Follow-up chest CT scan on 06/02/2010 showed parenchymal scarring in the left apex, left  lower lobe, and lingula; some of this scarring at the left base had a nodular appearance, unchanged.  . 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  Thyroid scan 08/28/2009 was normal with no focal areas of abnormal increased or decreased activity seen; the uptake of I 131 sodium iodide at 24 hours was 5.7%.  TSH and free T4 normalized by 08/16/2009.  [name] Pneumonia   . Intestinal obstruction   . Chronic systolic heart failure   . Other primary cardiomyopathies     2D echo on 02/06/2010 showed normal LV size with mild global hypokinesis, estimated EF 45%; mild biatrial enlargement; mild pulmonary hypertension (PA systolic pressure 42 mmHg assuming RA pressure 10 mmHg); normal RV size with mild systolic dysfunction.  [name] COPD (chronic obstructive pulmonary disease)   . Clostridium difficile colitis   . E coli bacteremia   . Cluster headaches   . DVT (deep venous thrombosis)     He has hypercoagulability with multiple occluded venous vessels  . Pleural plaque     Chest CT on 06/02/2010 showed stable extensive calcified pleural plaques involving the left hemithorax, consistent with asbestos related pleural disease.  . Weight loss     Normal colonoscopy by Dr. [name] on 11/06/2010.  . Atrial fibrillation with rapid ventricular response   . Tachycardia-bradycardia syndrome  s/p pacemaker Oct. 04  . Atrial fibrillation     Paroxysmal, failed medical therapy with amiodarone but has done very well with multaq  . Anginal pain     "every once in awhile now" (03/17/2012)  . Pacemaker   . Shortness of breath     "all the time" (03/17/2012)  . Osteoarthritis   . Spondylosis   . Chronic lower back pain     "cause of my hip" (03/17/2012)   Current Outpatient Prescriptions  Medication Sig Dispense Refill  . albuterol (PROVENTIL HFA;VENTOLIN HFA) 108 (90 BASE) MCG/ACT  inhaler Inhale 2 puffs into the lungs every 6 (six) hours as needed for wheezing or shortness of breath.      [name] buPROPion (WELLBUTRIN) 75 MG tablet Take 1 tablet (75 mg total) by mouth every morning.      . diltiazem (CARDIZEM CD) 240 MG 24 hr capsule Take 240 mg by mouth every morning.      . dronedarone (MULTAQ) 400 MG tablet Take 400 mg by mouth every morning.      . Ensure Plus (ENSURE PLUS) LIQD Take 237 mLs by mouth 2 (two) times daily between meals.  60 Can  5  . omeprazole (PRILOSEC) 20 MG capsule Take 20 mg by mouth 2 (two) times daily.      [name] oxyCODONE-acetaminophen (PERCOCET) 10-325 MG per tablet Take 1 tablet by mouth every 6 (six) hours as needed for pain.  120 tablet  0  . tiotropium (SPIRIVA) 18 MCG inhalation capsule Place 1 capsule (18 mcg total) into inhaler and inhale daily.  30 capsule  0  . warfarin (COUMADIN) 10 MG tablet Take 10 mg by mouth See admin instructions. Sun, Mon, Weds, Fri, and Sat only      . warfarin (COUMADIN) 5 MG tablet Take 7.5 mg by mouth 2 (two) times a week. Tues and Thurs only       No current facility-administered medications for this visit.   Family History  Problem Relation Age of Onset  . Hypertension Mother   . Cancer Brother     Unsure of type.  . Asthma Mother   . Coronary artery disease Mother   . Colon cancer Neg Hx   . Lung cancer Neg Hx   . Prostate cancer Neg Hx    History   Social History  . Marital Status: Widowed    Spouse Name: N/A    Number of Children: 10  . Years of Education: N/A   Occupational History  . RETIRED    Social History Main Topics  . Smoking status: Current Every Day Smoker -- 0.50 packs/day for 50 years    Types: Cigarettes  . Smokeless tobacco: Never Used     Comment: encouraged to quit today, not ready to quit  . Alcohol Use: No     Comment: 03/17/2012 "don't drink alcohol now; used to; never had problems w/it"  . Drug Use: 1.00 per week    Special: Marijuana     Comment: 03/17/2012 "last  marijuana was last week"  . Sexually Active: Yes   Other Topics Concern  . None   Social History Narrative   Works as a Media planner part time   Smoker 1 pack last 1.5 days tobacco, smokes marijuana    Denies EtOH x 4 years (on 10/12/12)   12 kids    From [place]   [place] Veteran    12 grade education  Review of Systems: Review of Systems:  Constitutional:  Denies fever, chills, diaphoresis, appetite change and fatigue.   HEENT:  Denies congestion, sore throat, rhinorrhea, sneezing, mouth sores, trouble swallowing, neck pain   Respiratory:  Denies SOB, DOE, cough, and wheezing.   Cardiovascular:  Denies palpitations and leg swelling.   Gastrointestinal:  Denies nausea, vomiting, abdominal pain, diarrhea, constipation, blood in stool and abdominal distention.   Genitourinary:  Denies dysuria, urgency, frequency, hematuria, flank pain and difficulty urinating.   Musculoskeletal:  Denies myalgias, back pain, joint swelling, arthralgias and gait problem.   Skin:  Denies pallor, rash and wound.   Neurological:  Denies dizziness, seizures, syncope, weakness, light-headedness, numbness and headaches.    .    Objective:  Physical Exam: Filed Vitals:   10/21/12 1528  BP: 136/88  Pulse: 78  Temp: 97.5 F (36.4 C)  TempSrc: Oral  Weight: 161 lb 3.2 oz (73.12 kg)   General: alert, well-developed, and cooperative to examination.  Head: normocephalic and atraumatic.  Eyes: vision grossly intact, pupils equal, pupils round, pupils reactive to light, no injection and anicteric.  Mouth: pharynx pink and moist, no erythema, and no exudates.  Neck: supple, full ROM, no thyromegaly, no JVD, and no carotid bruits.  Lungs: normal respiratory effort, no accessory muscle use, normal breath sounds, no crackles, and no wheezes. Heart: normal rate, regular rhythm, no murmur, no gallop, and no rub.  Abdomen: soft, non-tender, normal bowel sounds, no distention, no guarding, no  rebound tenderness, no hepatomegaly, and no splenomegaly.  Msk: no joint swelling, no joint warmth, and no redness over joints.  Pulses: 2+ DP/PT pulses bilaterally Extremities: No cyanosis, clubbing, edema.  Multiple moderate to severe calluses on B/L feet Neurologic: alert & oriented X3, cranial nerves II-XII intact, strength normal in all extremities, sensation intact to light touch, and gait normal.  Skin: turgor normal and no rashes.  Psych: Oriented X3, memory intact for recent and remote, normally interactive, good eye contact, not anxious appearing, and not depressed appearing.     Assessment & Plan:

## 2012-10-21 NOTE — Assessment & Plan Note (Addendum)
Patient was recently admitted for evaluation of lightheadedness and fall on 10/12/12.  His presyncope was thought to be related to orthostasis in the setting of marijuana abuse and possible drug interaction between marijuana and his home medications.  He was noted to have multiple moderate to severe calluses on both feet, which could contribute to his unsteady gait. Patient was discharged home on 10/14/12. He is also scheduled to see Dr. Tillie Rung on 10/30/12.  A tilt tablet test was recommended by the discharge resident. At the present, I do not think that patient needs this test done.  He has an appt with Dr. Tillie Rung on 10/30/12. I will defer it to Dr. Tillie Rung. ( email sent).

## 2012-10-21 NOTE — Assessment & Plan Note (Signed)
Multiple moderate to severe calluses noted on bilateral feet, which could contribute to his unsteady gait.  - Will send referral to podiatrist -Patient will need PT and OT once his calluses are removed

## 2012-10-21 NOTE — Assessment & Plan Note (Signed)
History of noncompliance with Dr. Alexandria Lodge appointments.   - INR today--2.6

## 2012-10-21 NOTE — Assessment & Plan Note (Addendum)
Baseline creatinine 1.5-1.7. On admission in May 2014, Cr 2.50-->2.12>>1.99. Etiology could be secondary to decreased oral intake/dehydration prior to admission, patient also uses NSAIDS sporadically.   - BMP today  Addendum - BMP reviewed and at baseline CKD.

## 2012-10-22 ENCOUNTER — Other Ambulatory Visit: Payer: Self-pay | Admitting: Internal Medicine

## 2012-10-22 NOTE — Telephone Encounter (Signed)
Pt would like you to call him @ [phone], it's about his office visit yesterday.

## 2012-10-23 NOTE — Telephone Encounter (Signed)
I returned call to this number but got no answer.

## 2012-10-28 ENCOUNTER — Other Ambulatory Visit: Payer: Self-pay | Admitting: *Deleted

## 2012-10-28 NOTE — Progress Notes (Signed)
TEACHING ATTENDING ADDENDUM: I discussed this case with Dr. Li at the time of the patient visit. I agree with the HPI, exam findings and have read the documentation provided by the resident,  and I concur with the plan of care. Please see the resident note for details of management.      

## 2012-10-29 MED ORDER — OXYCODONE-ACETAMINOPHEN 10-325 MG PO TABS: 1.0000 | ORAL_TABLET | Freq: Four times a day (QID) | ORAL | Status: DC | PRN

## 2012-10-29 NOTE — Telephone Encounter (Signed)
Refill printed and signed - nurse to complete. 

## 2012-10-29 NOTE — Telephone Encounter (Signed)
Pt will be out tonight please fill for dr Meredith Pel

## 2012-10-30 ENCOUNTER — Ambulatory Visit (INDEPENDENT_AMBULATORY_CARE_PROVIDER_SITE_OTHER): Payer: Medicare Other | Admitting: Internal Medicine

## 2012-10-30 ENCOUNTER — Encounter: Payer: Self-pay | Admitting: Internal Medicine

## 2012-10-30 VITALS — BP 114/62 | HR 115 | Ht 75.0 in | Wt 160.8 lb

## 2012-10-30 DIAGNOSIS — I4891 Unspecified atrial fibrillation: Secondary | ICD-10-CM

## 2012-10-30 DIAGNOSIS — I5022 Chronic systolic (congestive) heart failure: Secondary | ICD-10-CM

## 2012-10-30 DIAGNOSIS — I495 Sick sinus syndrome: Secondary | ICD-10-CM

## 2012-10-30 LAB — PACEMAKER DEVICE OBSERVATION
BATTERY VOLTAGE: 2.8 V
RV LEAD AMPLITUDE: 6.6 mv
RV LEAD THRESHOLD: 1 V
VENTRICULAR PACING PM: 1.3

## 2012-10-30 MED ORDER — DRONEDARONE HCL 400 MG PO TABS: 400.0000 mg | ORAL_TABLET | Freq: Two times a day (BID) | ORAL | Status: DC

## 2012-10-30 NOTE — Progress Notes (Signed)
PCP:  [name], MD  The patient presents today for routine electrophysiology followup.  I have not seen Nevaeh in quite some time.  His records have been reviewed at length today.  He reports that several weeks ago, he decreased his multaq to once daily.  Since that time, he has developed tachypalpitations with worsening SOB and fatigue.  He presents today in rapid atrial flutter.  Today, he denies symptoms of chest pain,  orthopnea, PND, lower extremity edema, dizziness, presyncope, syncope, or neurologic sequela.  The patient feels that he is tolerating medications without difficulties and is otherwise without complaint today.   Past Medical History  Diagnosis Date  . Hypertension   . Renal insufficiency     Renal U/S 12/04/2009 showed no pathological findings. Labs 12/04/2009 include normal ESR, C3, C4; neg ANA; SPEP showed nonspecific increase in the alpha-2 region with no M-spike; UPEP showed no monoclonal free light chains; urine IFE showed polyclonal increase in feree Kappa and/or free Lambda light chains.  . Chronic systolic heart failure   . Depression   . GERD (gastroesophageal reflux disease)   . Portal vein thrombosis   . Avascular necrosis     right hip s/p replacement  . Gastritis   . Alcohol abuse   . Erectile dysfunction   . Bipolar disorder   . Hydrocele, unspecified   . Intermittent claudication   . Edema   . Lung nodule     Chest CT scan on 12/14/2008 showed a nodular opacity at the left lung base felt to most likely represent scarring.  Follow-up chest CT scan on 06/02/2010 showed parenchymal scarring in the left apex, left  lower lobe, and lingula; some of this scarring at the left base had a nodular appearance, unchanged.  . 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  Thyroid scan 08/28/2009 was normal with no focal areas of abnormal increased or decreased activity seen; the uptake of I 131 sodium iodide at 24 hours  was 5.7%.  TSH and free T4 normalized by 08/16/2009.  [name] Pneumonia   . Intestinal obstruction   . Chronic systolic heart failure   . Other primary cardiomyopathies     2D echo on 02/06/2010 showed normal LV size with mild global hypokinesis, estimated EF 45%; mild biatrial enlargement; mild pulmonary hypertension (PA systolic pressure 42 mmHg assuming RA pressure 10 mmHg); normal RV size with mild systolic dysfunction.  [name] COPD (chronic obstructive pulmonary disease)   . Clostridium difficile colitis   . E coli bacteremia   . Cluster headaches   . DVT (deep venous thrombosis)     He has hypercoagulability with multiple occluded venous vessels  . Pleural plaque     Chest CT on 06/02/2010 showed stable extensive calcified pleural plaques involving the left hemithorax, consistent with asbestos related pleural disease.  . Weight loss     Normal colonoscopy by Dr. [name] on 11/06/2010.  . Atrial fibrillation with rapid ventricular response   . 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 Atrial fibrillation     Paroxysmal, failed medical therapy with amiodarone but has done very well with multaq  . Anginal pain     "every once in awhile now" (03/17/2012)  . Pacemaker   . Shortness of breath     "all the time" (03/17/2012)  . Osteoarthritis   . Spondylosis   . Chronic lower back pain     "cause of my  hip" (03/17/2012)   Past Surgical History  Procedure Laterality Date  . Left hip bipolar hemiarthroplasty  09/09/2002    By Dr. Kristeen Miss  . Insert / replace / remove pacemaker  02/2003    By Dr. Charolette Child, gen change by Fawn Kirk  . Total hip arthroplasty  03/08/2008    right; By Dr. Charlesetta Shanks    Current Outpatient Prescriptions  Medication Sig Dispense Refill  . albuterol (PROVENTIL HFA;VENTOLIN HFA) 108 (90 BASE) MCG/ACT inhaler Inhale 2 puffs into the lungs every 6 (six) hours as needed for wheezing or shortness of breath.      [name] buPROPion (WELLBUTRIN) 75 MG  tablet Take 1 tablet (75 mg total) by mouth every morning.      . diltiazem (CARDIZEM CD) 240 MG 24 hr capsule Take 1 capsule (240 mg total) by mouth daily.  60 capsule  3  . dronedarone (MULTAQ) 400 MG tablet Take 1 tablet (400 mg total) by mouth 2 (two) times daily with a meal.  60 tablet    . Ensure Plus (ENSURE PLUS) LIQD Take 237 mLs by mouth 2 (two) times daily between meals.  60 Can  5  . omeprazole (PRILOSEC) 20 MG capsule Take 20 mg by mouth 2 (two) times daily.      [name] oxyCODONE-acetaminophen (PERCOCET) 10-325 MG per tablet Take 1 tablet by mouth every 6 (six) hours as needed for pain.  120 tablet  0  . tiotropium (SPIRIVA) 18 MCG inhalation capsule Place 1 capsule (18 mcg total) into inhaler and inhale daily.  30 capsule  0  . warfarin (COUMADIN) 10 MG tablet Take 10 mg by mouth See admin instructions. Sun, Mon, Weds, Fri, and Sat only      . warfarin (COUMADIN) 5 MG tablet Take 7.5 mg by mouth 2 (two) times a week. Tues and Thurs only       No current facility-administered medications for this visit.    Allergies  Allergen Reactions  . Penicillins Rash    History   Social History  . Marital Status: Widowed    Spouse Name: N/A    Number of Children: 10  . Years of Education: N/A   Occupational History  . RETIRED    Social History Main Topics  . Smoking status: Current Every Day Smoker -- 0.50 packs/day for 50 years    Types: Cigarettes  . Smokeless tobacco: Never Used     Comment: encouraged to quit today, not ready to quit  . Alcohol Use: No     Comment: 03/17/2012 "don't drink alcohol now; used to; never had problems w/it"  . Drug Use: 1.00 per week    Special: Marijuana     Comment: 03/17/2012 "last marijuana was last week"  . Sexually Active: Yes   Other Topics Concern  . Not on file   Social History Narrative   Works as a Media planner part time   Smoker 1 pack last 1.5 days tobacco, smokes marijuana    Denies EtOH x 4 years (on 10/12/12)   12 kids    From  [place]   [place] Veteran    12 grade education              Family History  Problem Relation Age of Onset  . Hypertension Mother   . Cancer Brother     Unsure of type.  . Asthma Mother   . Coronary artery disease Mother   . Colon cancer Neg Hx   .  Lung cancer Neg Hx   . Prostate cancer Neg Hx     ROS-  All systems are reviewed and are negative except as outlined in the HPI above  Physical Exam: Filed Vitals:   10/30/12 1200  BP: 114/62  Pulse: 115  Height: 6\' 3"  (1.905 m)  Weight: 160 lb 12.8 oz (72.938 kg)    GEN- The patient is well appearing, alert and oriented x 3 today.   Head- normocephalic, atraumatic Eyes-  Sclera clear, conjunctiva pink Ears- hearing intact Oropharynx- clear Neck- supple  Lungs- Clear to ausculation bilaterally, normal work of breathing Heart- tachy irregular rhythm GI- soft, NT, ND, + BS Extremities- no clubbing, cyanosis, or edema MS- no significant deformity or atrophy Skin- no rash or lesion Psych- euthymic mood, full affect Neuro- strength and sensation are intact  Pacemaker interrogation today reveals rapidly conducting atrial flutter (HR 140s) Otherwise normal pacemaker function  Assessment and Plan: 1. Atrial flutter Recently increase in atrial flutter due being discharged on this medicine daily rather than twice daily when he recently left the hospital. Today, I have increased multaq back to 400mg  BID.  We have tried many different medicine regimens over time which have been ineffective (including amiodarone).  For whatever reason, twice daily multaq has been very effective for Suren for several years. He has not taken his cardizem yet today.  Compliance is advised. Coumadin compliance is also recommended. He will return on Monday for an ekg.  If back in sinus then I will see him in 5-6 weeks.  If he is still in atrial flutter then he will require cardioversion early next week (either Monday or Tuesday).  Given recent  subtherapeutic INR, a TEE would be reasonable prior to cardioversion (though he was in sinus when his INR was subtherapeutic).  2. Bradycardia Normal pacemaker function See Pace Art report No changes today  3. Chronic systolic dysfunction Worsening symptoms likely due to rapidly conducting atrial flutter Compliance with diltiazem is encouraged Repeat echo once rhythm is stable  Return to see me in 5-6 weeks

## 2012-10-30 NOTE — Patient Instructions (Addendum)
Your physician recommends that you schedule a follow-up appointment on Monday, June 16 with the nurse for an EKG.  If pt is still in flutter, will need to be set up for a TEE/Cardioversion.  Your physician recommends that you schedule a follow-up appointment in: 5 weeks with Dr. Johney Frame.  Increase Multaq to twice daily.

## 2012-11-02 ENCOUNTER — Ambulatory Visit (INDEPENDENT_AMBULATORY_CARE_PROVIDER_SITE_OTHER): Payer: Medicare Other | Admitting: *Deleted

## 2012-11-02 VITALS — BP 118/68 | HR 62 | Ht 75.0 in | Wt 157.2 lb

## 2012-11-02 DIAGNOSIS — I4891 Unspecified atrial fibrillation: Secondary | ICD-10-CM

## 2012-11-02 DIAGNOSIS — I4892 Unspecified atrial flutter: Secondary | ICD-10-CM

## 2012-11-02 NOTE — Progress Notes (Signed)
Patient here for a follow up EKG. On Friday 6/13 the patient was reported to be in a-flutter with RVR. His multaq was increased to 400 mg BID. He has been compliant with this, but did not take his dose this morning. EKG reviewed by Dr. Clifton James today and noted to be in normal sinus rhythm. No changes made today. The patient has been instructed to call back should he become SOB/ notice rapid heart rates. He verbalizes understanding.

## 2012-11-03 ENCOUNTER — Encounter: Payer: Self-pay | Admitting: Internal Medicine

## 2012-11-05 IMAGING — CR DG HIP COMPLETE 2+V*R*
3 series · 3 of 3 positions shown · non-contrast
Comparison: 03/08/2008

CLINICAL DATA: Right hip pain, status post right total hip
arthroplasty in 1227

RIGHT HIP - COMPLETE 2+ VIEW

[t pelvis a.p.]
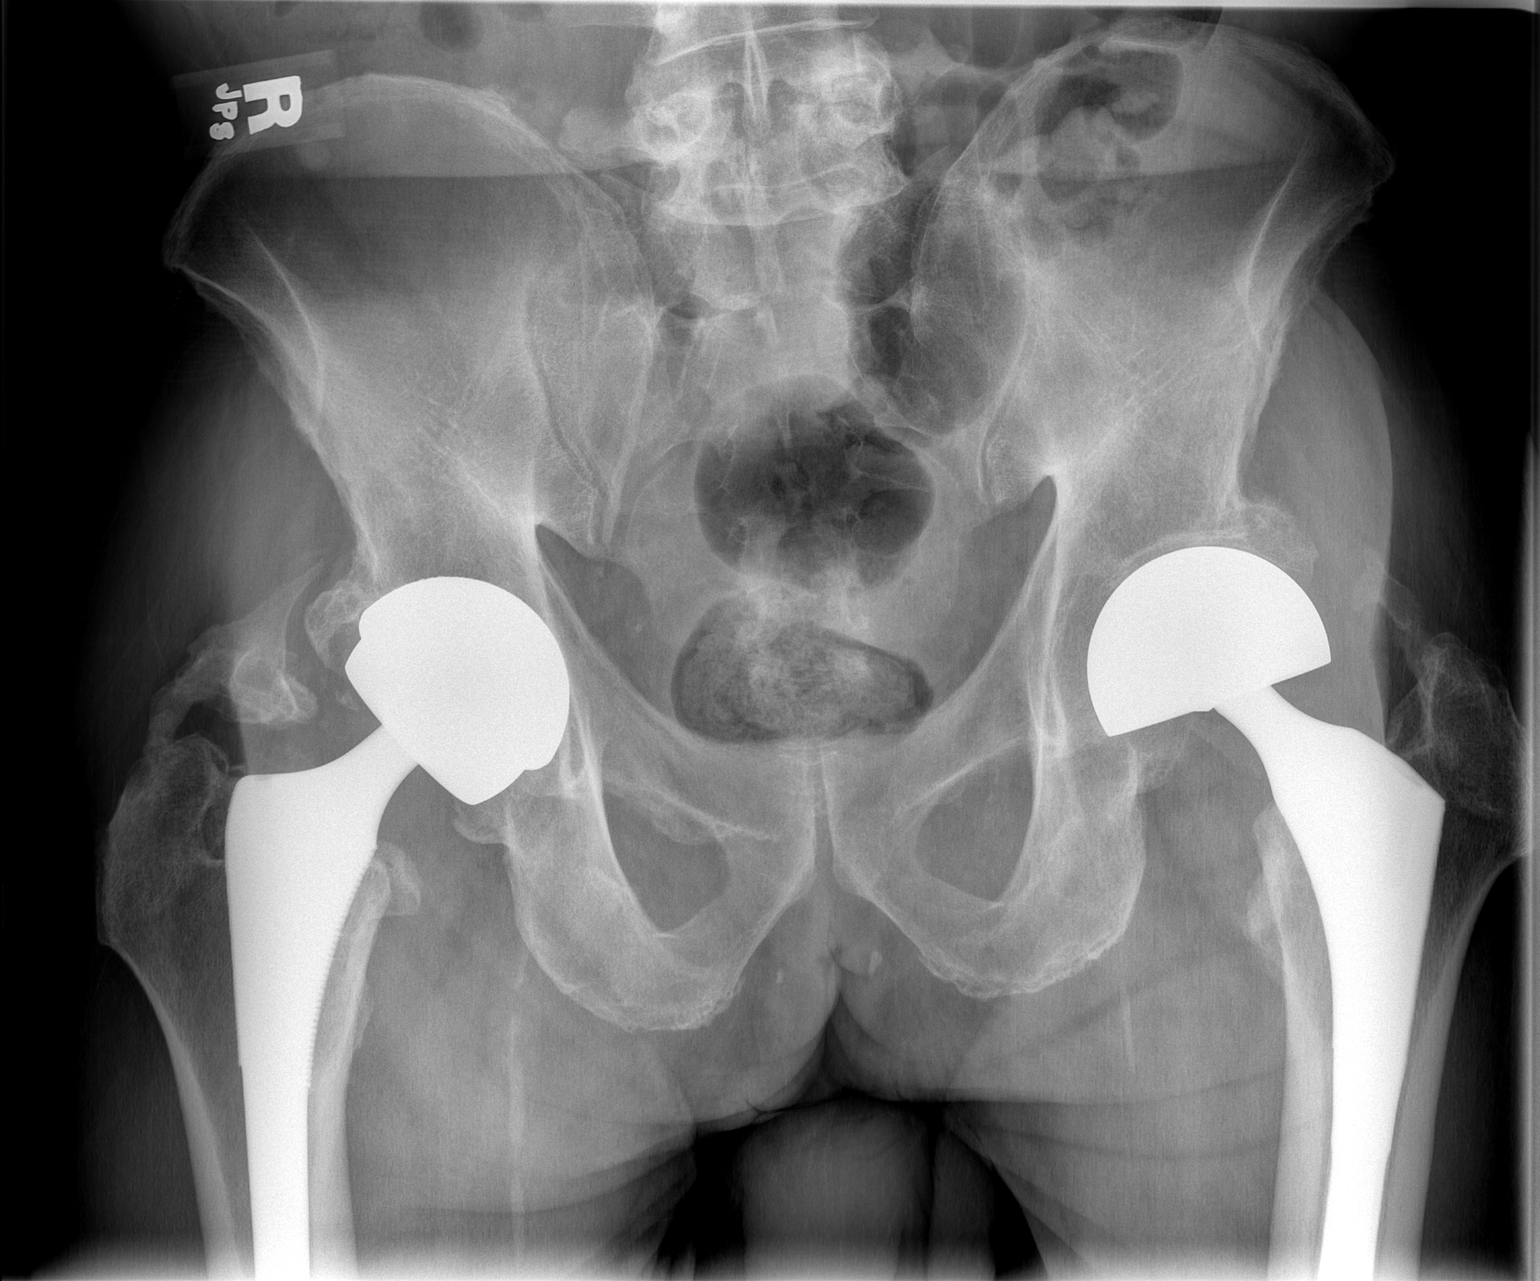

[t hip ap right]
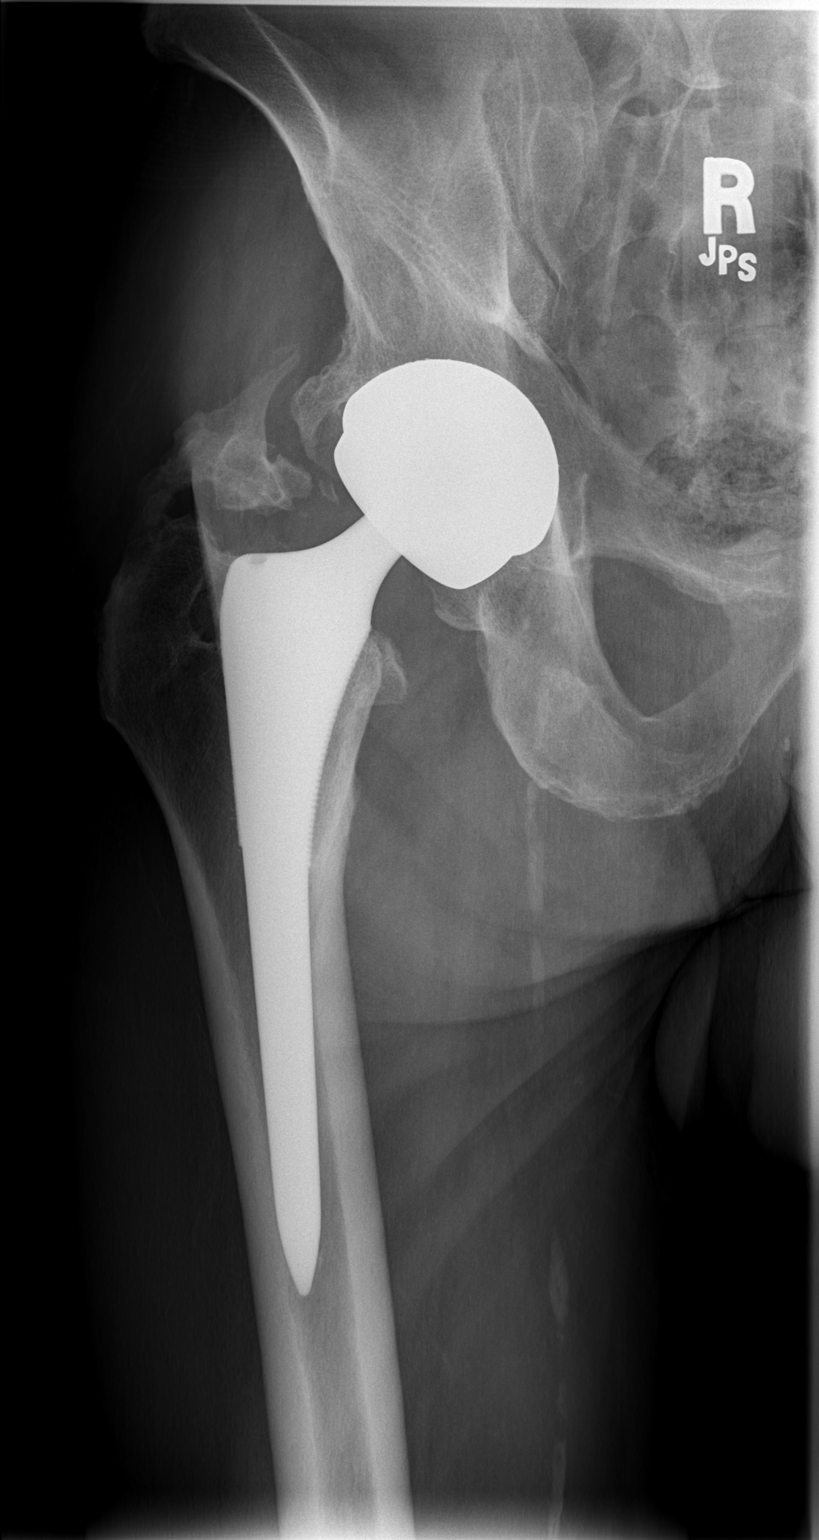

[t hip frog leg right]
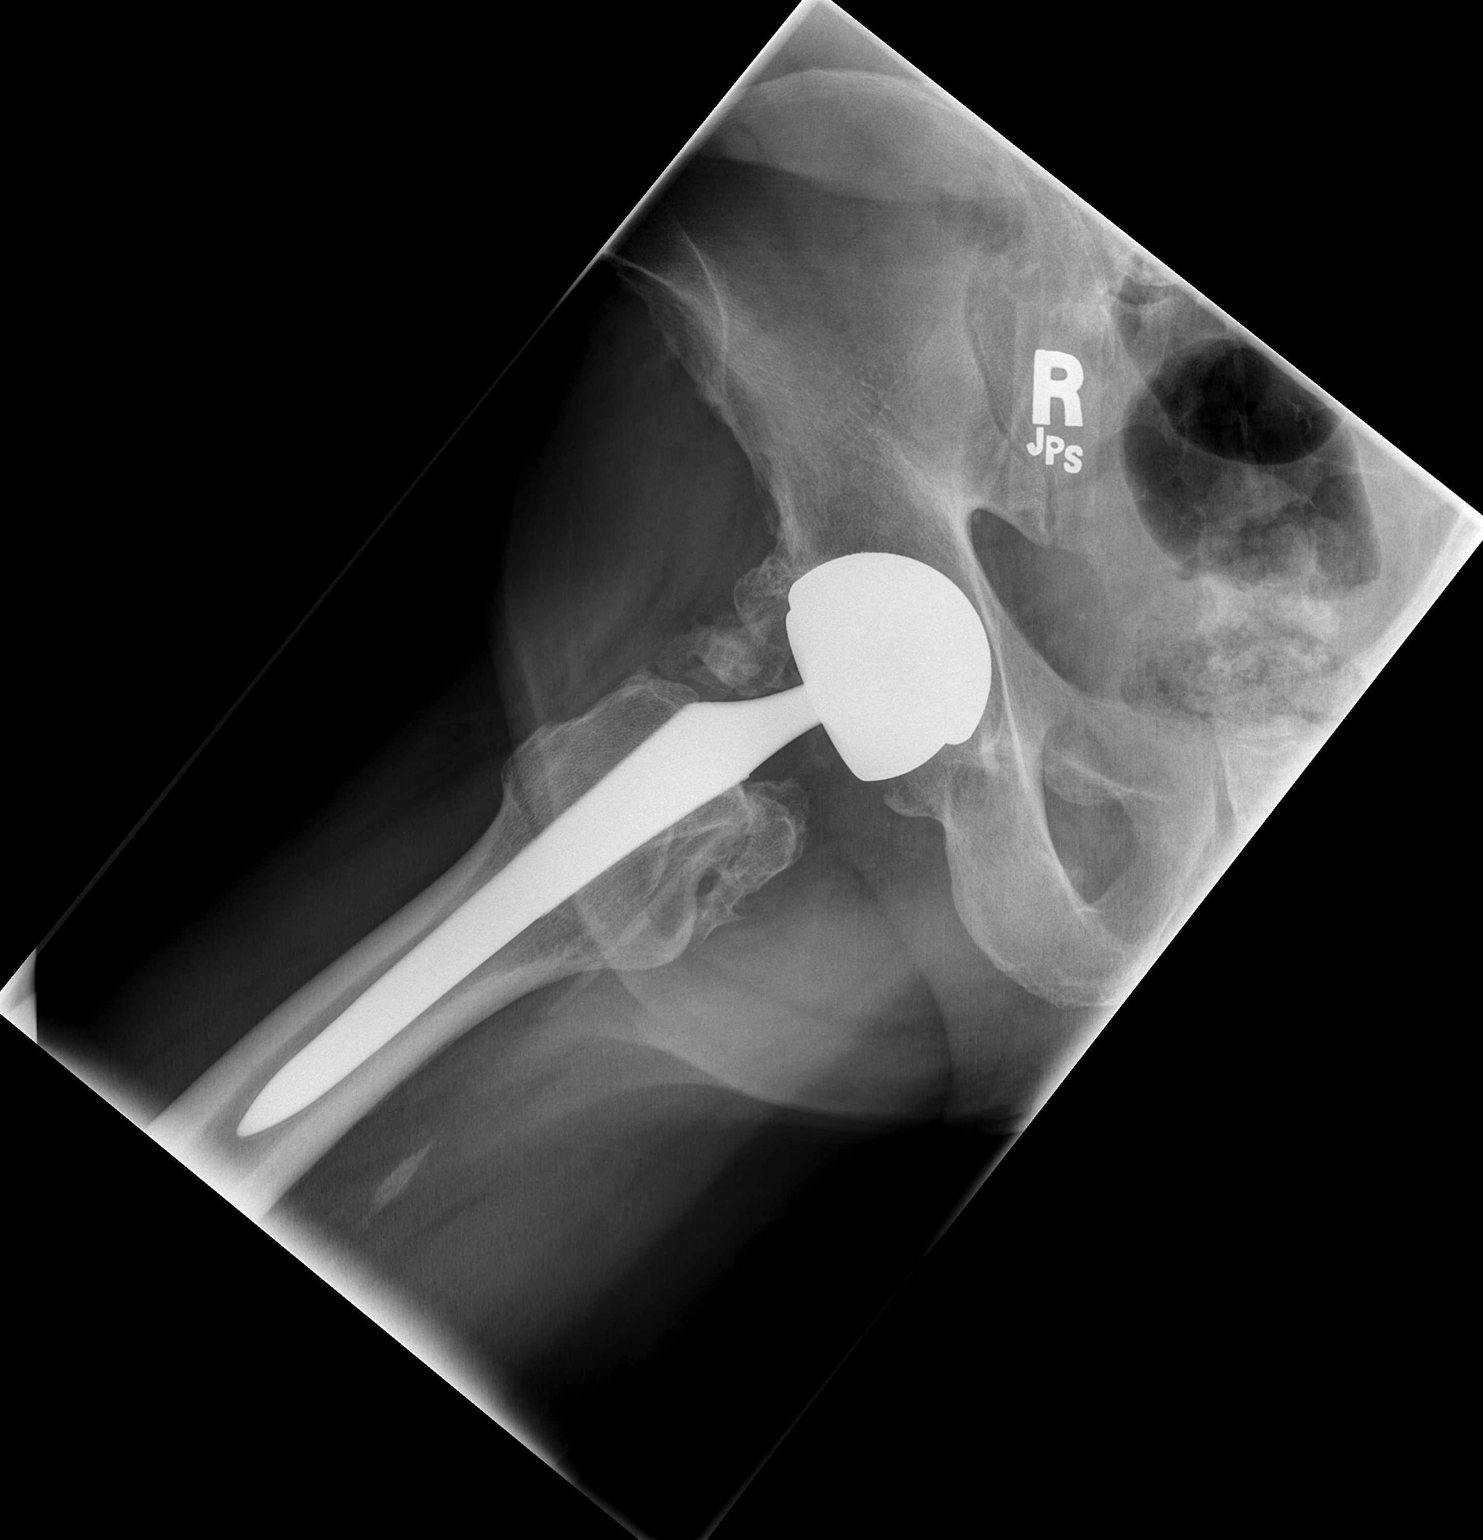

[3 of 3 positions shown; findings below may reference images not displayed]

FINDINGS: No fracture or dislocation is seen.

Right total hip arthroplasty without evidence of hardware
complication.

Interval development of associated dystrophic calcification.

Left total hip arthroplasty.

Degenerative changes of the lower lumbar spine.

Vascular calcifications.
IMPRESSION: No fracture or dislocation is seen.

Right total hip arthroplasty, without evidence of hardware
complication.  Associated dystrophic calcifications.

Left total hip arthroplasty.

## 2012-11-22 ENCOUNTER — Other Ambulatory Visit: Payer: Self-pay | Admitting: Internal Medicine

## 2012-11-23 ENCOUNTER — Ambulatory Visit (INDEPENDENT_AMBULATORY_CARE_PROVIDER_SITE_OTHER): Payer: Medicare Other | Admitting: Pharmacist

## 2012-11-23 DIAGNOSIS — I81 Portal vein thrombosis: Secondary | ICD-10-CM

## 2012-11-23 DIAGNOSIS — Z7901 Long term (current) use of anticoagulants: Secondary | ICD-10-CM

## 2012-11-23 LAB — POCT INR: INR: 1

## 2012-11-23 MED ORDER — WARFARIN SODIUM 5 MG PO TABS: ORAL_TABLET | ORAL | Status: DC

## 2012-11-23 NOTE — Patient Instructions (Signed)
Patient instructed to take medications as defined in the Anti-coagulation Track section of this encounter.  Patient instructed to take today's dose.  Patient verbalized understanding of these instructions.    

## 2012-11-23 NOTE — Progress Notes (Signed)
Anti-Coagulation Progress Note  [name]. is a 67 y.o. male who is currently on an anti-coagulation regimen.    RECENT RESULTS: Recent results are below, the most recent result is correlated with a dose of 65 mg. per week WHEN TAKING WARFARIN (with last documented INR when ON warfarin = 2.6): For past 1 week, he has been OFF warfarin (ran out). Encouraged him to keep his appointments and to notify clinic when his supply of warfarin is running low so as to avoid interruption in his warfarin therapy. Lab Results  Component Value Date   INR 1.0 11/23/2012   INR 2.6 10/21/2012   INR 1.73* 10/14/2012    ANTI-COAG DOSE: Anticoagulation Dose Instructions as of 11/23/2012     [name] Tue Wed Thu Fri Sat   New Dose 10 mg 10 mg 10 mg 7.5 mg 10 mg 7.5 mg 10 mg       ANTICOAG SUMMARY: Anticoagulation Episode Summary   Current INR goal   Next INR check 12/14/2012  INR from last check 1.0 (11/23/2012)  Weekly max dose   Target end date   INR check location   Preferred lab   Send INR reminders to ANTICOAG IMP   Indications  Long-term (current) use of anticoagulants [V58.61] Atrila fibrillation (Resolved) [427.31] Thrombosis 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]        Comments       Anticoagulation Care Providers   Provider Role Specialty Phone number   [name], MD  Internal Medicine [phone]      ANTICOAG TODAY: Anticoagulation Summary as of 11/23/2012   INR goal   Selected INR 1.0 (11/23/2012)  Next INR check 12/14/2012  Target end date    Indications  Long-term (current) use of anticoagulants [V58.61] Atrila fibrillation (Resolved) [427.31] Thrombosis 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]      Anticoagulation Episode Summary   INR check location    Preferred lab    Send INR reminders to ANTICOAG IMP   Comments     Anticoagulation Care Providers   Provider Role Specialty Phone number   [name], MD  Internal Medicine [phone]      PATIENT INSTRUCTIONS: Patient Instructions   Patient instructed to take medications as defined in the Anti-coagulation Track section of this encounter.  Patient instructed to take today's dose.  Patient verbalized understanding of these instructions.       FOLLOW-UP Return in 3 weeks (on 12/14/2012) for Follow up INR at 3:15PM.  [name], III Pharm.D., CACP

## 2012-11-25 ENCOUNTER — Ambulatory Visit (INDEPENDENT_AMBULATORY_CARE_PROVIDER_SITE_OTHER): Payer: Medicare Other | Admitting: Internal Medicine

## 2012-11-25 ENCOUNTER — Encounter: Payer: Self-pay | Admitting: Internal Medicine

## 2012-11-25 VITALS — BP 121/75 | HR 60 | Temp 97.8°F | Ht 75.0 in | Wt 165.4 lb

## 2012-11-25 DIAGNOSIS — J449 Chronic obstructive pulmonary disease, unspecified: Secondary | ICD-10-CM

## 2012-11-25 DIAGNOSIS — I4891 Unspecified atrial fibrillation: Secondary | ICD-10-CM

## 2012-11-25 DIAGNOSIS — I10 Essential (primary) hypertension: Secondary | ICD-10-CM

## 2012-11-25 DIAGNOSIS — M25552 Pain in left hip: Secondary | ICD-10-CM

## 2012-11-25 DIAGNOSIS — M25559 Pain in unspecified hip: Secondary | ICD-10-CM

## 2012-11-25 DIAGNOSIS — M25551 Pain in right hip: Secondary | ICD-10-CM

## 2012-11-25 DIAGNOSIS — F329 Major depressive disorder, single episode, unspecified: Secondary | ICD-10-CM

## 2012-11-25 MED ORDER — BUPROPION HCL 75 MG PO TABS: 75.0000 mg | ORAL_TABLET | Freq: Two times a day (BID) | ORAL | Status: DC

## 2012-11-25 MED ORDER — OXYCODONE-ACETAMINOPHEN 10-325 MG PO TABS: 1.0000 | ORAL_TABLET | Freq: Four times a day (QID) | ORAL | Status: DC | PRN

## 2012-11-25 NOTE — Assessment & Plan Note (Signed)
Assessment: When the patient saw his cardiologist Dr. Johney Frame in June, he was having palpitations and was in rapid atrial flutter.  At that time he was on Multaq 400 mg daily, and Dr. Johney Frame increased this to 400 mg twice a day.  A follow-up EKG done by Dr. Jenel Lucks office in June showed normal sinus rhythm.  Patient reports improvement in his symptoms on the higher dose of Multaq.  Plan: Continue current medications; follow up with Dr. Johney Frame as scheduled.

## 2012-11-25 NOTE — Progress Notes (Signed)
  Subjective:    Patient ID: Travis Benes., male    DOB: 10/20/1945, 67 y.o.   MRN: [id number]  HPI Patient returns for follow-up of his hypertension, chronic bilateral hip pain, and other chronic medical problems.  He reports worsening of his hip pain over the past month; the oxycodone/acetaminophen has been providing reasonable relief of the pain.  He has occasional episodes of palpitations but these improved after his cardiologist increased his dose of Multaq last month.   Review of Systems  Constitutional: Negative for fever, chills and diaphoresis.  Respiratory: Negative for shortness of breath.   Cardiovascular: Negative for chest pain and leg swelling.  Gastrointestinal: Negative for nausea, vomiting and abdominal pain.  Genitourinary: Negative for dysuria.  Musculoskeletal: Negative for myalgias.       Objective:   Physical Exam  Constitutional: No distress.  Cardiovascular: Normal rate and regular rhythm.  Exam reveals no gallop and no friction rub.   No murmur heard. Pulmonary/Chest: Effort normal and breath sounds normal. No respiratory distress. He has no wheezes. He has no rales.  Abdominal: Soft. Bowel sounds are normal. He exhibits no distension. There is no tenderness. There is no rebound and no guarding.  Musculoskeletal: He exhibits no edema.       Assessment & Plan:

## 2012-11-25 NOTE — Assessment & Plan Note (Signed)
Assessment: Patient reports that he is doing well on bupropion 75 mg twice a day.  Plan: Continue bupropion 75 mg twice a day

## 2012-11-25 NOTE — Assessment & Plan Note (Signed)
Lab Results  Component Value Date   CREATININE 2.00* 10/21/2012   CREATININE 1.99* 10/14/2012   CREATININE 2.12* 10/13/2012   CREATININE 2.50* 10/12/2012   CREATININE 1.47* 04/22/2012     Assessment: Patient has chronic kidney disease with reasonably stable renal function.  Plan:  Check a metabolic panel today; I discussed elective referral to nephrology, and patient agreed to this.

## 2012-11-25 NOTE — Assessment & Plan Note (Signed)
BP Readings from Last 3 Encounters:  11/25/12 121/75  11/02/12 118/68  10/30/12 114/62    Lab Results  Component Value Date   NA 142 10/21/2012   K 4.9 10/21/2012   CREATININE 2.00* 10/21/2012    Assessment: Blood pressure control: controlled Progress toward BP goal:  at goal Comments: Blood pressure is well controlled on diltiazem CD 240 mg daily  Plan: Medications:  continue current medications

## 2012-11-25 NOTE — Assessment & Plan Note (Signed)
Assessment: Patient is status post bilateral hip arthroplasty by Dr. Renae Fickle and has chronic bilateral hip pain.  He reports that this has gradually worsened.  Plan: Continue current symptomatic treatment with oxycodone/acetaminophen; obtain bilateral hip x-rays; I advised patient to schedule a follow-up appointment with Dr. Renae Fickle.

## 2012-11-25 NOTE — Assessment & Plan Note (Signed)
Assessment: Patient Is doing well on Spiriva and albuterol when needed  Plan: Continue current medications.

## 2012-11-25 NOTE — Patient Instructions (Signed)
Continue current medications. 

## 2012-11-26 LAB — COMPLETE METABOLIC PANEL WITH GFR
ALT: 9 U/L (ref 0–53)
Alkaline Phosphatase: 60 U/L (ref 39–117)
Calcium: 9.4 mg/dL (ref 8.4–10.5)
GFR, Est African American: 46 mL/min — ABNORMAL LOW
Glucose, Bld: 50 mg/dL — ABNORMAL LOW (ref 70–99)
Potassium: 5 mEq/L (ref 3.5–5.3)
Sodium: 143 mEq/L (ref 135–145)

## 2012-11-30 ENCOUNTER — Ambulatory Visit (INDEPENDENT_AMBULATORY_CARE_PROVIDER_SITE_OTHER): Payer: Medicare Other | Admitting: Internal Medicine

## 2012-11-30 ENCOUNTER — Encounter: Payer: Self-pay | Admitting: Internal Medicine

## 2012-11-30 VITALS — BP 100/64 | HR 73 | Ht 75.0 in | Wt 158.8 lb

## 2012-11-30 DIAGNOSIS — R5381 Other malaise: Secondary | ICD-10-CM

## 2012-11-30 DIAGNOSIS — R0609 Other forms of dyspnea: Secondary | ICD-10-CM

## 2012-11-30 DIAGNOSIS — R0600 Dyspnea, unspecified: Secondary | ICD-10-CM

## 2012-11-30 DIAGNOSIS — R0602 Shortness of breath: Secondary | ICD-10-CM

## 2012-11-30 DIAGNOSIS — I495 Sick sinus syndrome: Secondary | ICD-10-CM

## 2012-11-30 DIAGNOSIS — I4891 Unspecified atrial fibrillation: Secondary | ICD-10-CM

## 2012-11-30 DIAGNOSIS — R5383 Other fatigue: Secondary | ICD-10-CM

## 2012-11-30 DIAGNOSIS — I5022 Chronic systolic (congestive) heart failure: Secondary | ICD-10-CM

## 2012-11-30 LAB — CBC WITH DIFFERENTIAL/PLATELET
Basophils Absolute: 0 K/uL (ref 0.0–0.1)
Basophils Relative: 0.3 % (ref 0.0–3.0)
Eosinophils Absolute: 0.4 K/uL (ref 0.0–0.7)
Eosinophils Relative: 3.2 % (ref 0.0–5.0)
HCT: 48.2 % (ref 39.0–52.0)
Hemoglobin: 16.1 g/dL (ref 13.0–17.0)
Lymphocytes Relative: 35.3 % (ref 12.0–46.0)
Lymphs Abs: 4 K/uL (ref 0.7–4.0)
MCHC: 33.5 g/dL (ref 30.0–36.0)
MCV: 95.1 fl (ref 78.0–100.0)
Monocytes Absolute: 0.9 K/uL (ref 0.1–1.0)
Monocytes Relative: 7.6 % (ref 3.0–12.0)
Neutro Abs: 6.1 K/uL (ref 1.4–7.7)
Neutrophils Relative %: 53.6 % (ref 43.0–77.0)
Platelets: 177 K/uL (ref 150.0–400.0)
RBC: 5.06 Mil/uL (ref 4.22–5.81)
RDW: 14.9 % — ABNORMAL HIGH (ref 11.5–14.6)
WBC: 11.4 K/uL — ABNORMAL HIGH (ref 4.5–10.5)

## 2012-11-30 LAB — PACEMAKER DEVICE OBSERVATION: DEVICE MODEL PM: 2045834

## 2012-11-30 LAB — T4, FREE: Free T4: 1.25 ng/dL (ref 0.60–1.60)

## 2012-11-30 LAB — TSH: TSH: 4.21 u[IU]/mL (ref 0.35–5.50)

## 2012-11-30 NOTE — Patient Instructions (Addendum)
Your physician wants you to follow-up in: 3 MONTHS WITH DR Jacquiline Doe will receive a reminder letter in the mail two months in advance. If you don't receive a letter, please call our office to schedule the follow-up appointment.   Your physician has requested that you have an echocardiogram. Echocardiography is a painless test that uses sound waves to create images of your heart. It provides your doctor with information about the size and shape of your heart and how well your heart's chambers and valves are working. This procedure takes approximately one hour. There are no restrictions for this procedure.   Your physician recommends that you HAVE LAB WORK TODAY

## 2012-11-30 NOTE — Progress Notes (Signed)
PCP: [name], MD  Travis Capuchin Sr. is a 67 y.o. male who presents today for routine electrophysiology followup.  Since last being seen in our clinic, the patient reports that he has had increased fatigue and shortness of breath.  He ran out of his Coumadin last week and his last INR was subtherapeutic at 1.  He has been taking his Multaq and Cardizem as directed.    Today, he denies symptoms of palpitations, chest pain,  lower extremity edema, dizziness, presyncope, or syncope.  The patient is otherwise without complaint today.   Past Medical History  Diagnosis Date  . Hypertension   . Renal insufficiency     Renal U/S 12/04/2009 showed no pathological findings. Labs 12/04/2009 include normal ESR, C3, C4; neg ANA; SPEP showed nonspecific increase in the alpha-2 region with no M-spike; UPEP showed no monoclonal free light chains; urine IFE showed polyclonal increase in feree Kappa and/or free Lambda light chains.  . Chronic systolic heart failure   . Depression   . GERD (gastroesophageal reflux disease)   . Portal vein thrombosis   . Avascular necrosis     right hip s/p replacement  . Gastritis   . Alcohol abuse   . Erectile dysfunction   . Bipolar disorder   . Hydrocele, unspecified   . Intermittent claudication   . Edema   . Lung nodule     Chest CT scan on 12/14/2008 showed a nodular opacity at the left lung base felt to most likely represent scarring.  Follow-up chest CT scan on 06/02/2010 showed parenchymal scarring in the left apex, left  lower lobe, and lingula; some of this scarring at the left base had a nodular appearance, unchanged.  . 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  Thyroid scan 08/28/2009 was normal with no focal areas of abnormal increased or decreased activity seen; the uptake of I 131 sodium iodide at 24 hours was 5.7%.  TSH and free T4 normalized by 08/16/2009.  [name] Pneumonia   . Intestinal obstruction   .  Chronic systolic heart failure   . Other primary cardiomyopathies     2D echo on 02/06/2010 showed normal LV size with mild global hypokinesis, estimated EF 45%; mild biatrial enlargement; mild pulmonary hypertension (PA systolic pressure 42 mmHg assuming RA pressure 10 mmHg); normal RV size with mild systolic dysfunction.  [name] COPD (chronic obstructive pulmonary disease)   . Clostridium difficile colitis   . E coli bacteremia   . Cluster headaches   . DVT (deep venous thrombosis)     He has hypercoagulability with multiple occluded venous vessels  . Pleural plaque     Chest CT on 06/02/2010 showed stable extensive calcified pleural plaques involving the left hemithorax, consistent with asbestos related pleural disease.  . Weight loss     Normal colonoscopy by Dr. [name] on 11/06/2010.  . Atrial fibrillation with rapid ventricular response   . 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 Atrial fibrillation     Paroxysmal, failed medical therapy with amiodarone but has done very well with multaq  . Anginal pain     "every once in awhile now" (03/17/2012)  . Pacemaker   . Shortness of breath     "all the time" (03/17/2012)  . Osteoarthritis   . Spondylosis   . Chronic lower back pain     "cause of my hip" (03/17/2012)   Past Surgical History  Procedure Laterality  Date  . Left hip bipolar hemiarthroplasty  09/09/2002    By Dr. Kristeen Miss  . Insert / replace / remove pacemaker  02/2003    By Dr. Charolette Child, gen change by Fawn Kirk  . Total hip arthroplasty  03/08/2008    right; By Dr. Charlesetta Shanks    Current Outpatient Prescriptions  Medication Sig Dispense Refill  . albuterol (PROVENTIL HFA;VENTOLIN HFA) 108 (90 BASE) MCG/ACT inhaler Inhale 2 puffs into the lungs every 6 (six) hours as needed for wheezing or shortness of breath.      [name] buPROPion (WELLBUTRIN) 75 MG tablet Take 1 tablet (75 mg total) by mouth 2 (two) times daily.  60 tablet  5  . diltiazem (CARDIZEM  CD) 240 MG 24 hr capsule Take 1 capsule (240 mg total) by mouth daily.  60 capsule  3  . dronedarone (MULTAQ) 400 MG tablet Take 1 tablet (400 mg total) by mouth 2 (two) times daily with a meal.  60 tablet    . Ensure Plus (ENSURE PLUS) LIQD Take 237 mLs by mouth 2 (two) times daily between meals.  60 Can  5  . omeprazole (PRILOSEC) 20 MG capsule Take 20 mg by mouth 2 (two) times daily.      [name] oxyCODONE-acetaminophen (PERCOCET) 10-325 MG per tablet Take 1 tablet by mouth every 6 (six) hours as needed for pain.  120 tablet  0  . tiotropium (SPIRIVA) 18 MCG inhalation capsule Place 1 capsule (18 mcg total) into inhaler and inhale daily.  30 capsule  0  . warfarin (COUMADIN) 5 MG tablet Take as directed by anticoagulation clinic provider.       No current facility-administered medications for this visit.    Physical Exam: Filed Vitals:   11/30/12 1435  BP: 100/64  Pulse: 73  Height: 6\' 3"  (1.905 m)  Weight: 158 lb 12.8 oz (72.031 kg)    GEN- The patient is well appearing, alert and oriented x 3 today.   Head- normocephalic, atraumatic Eyes-  Sclera clear, conjunctiva pink Ears- hearing intact Oropharynx- clear Lungs- Clear to ausculation bilaterally, normal work of breathing Chest- pacemaker pocket is well healed Heart- Regular rate and rhythm, no murmurs, rubs or gallops, PMI not laterally displaced GI- soft, NT, ND, + BS Extremities- no clubbing, cyanosis, or edema  Pacemaker interrogation- reviewed in detail today,  See PACEART report ekg today reveals sinus with frequent PACs, no ischemic changes  Assessment and Plan:  1. afib Now back in sinus rhythm  Continue current strategy  2. Fatigue/SOB Check TFTs and CBC Echo  3. Tachy/brady Normal pacemaker function See Arita Miss Art report No changes today  Return in 3 months

## 2012-12-07 ENCOUNTER — Ambulatory Visit (HOSPITAL_COMMUNITY): Payer: Medicare Other | Attending: Internal Medicine | Admitting: Radiology

## 2012-12-07 ENCOUNTER — Encounter: Payer: Self-pay | Admitting: Internal Medicine

## 2012-12-07 DIAGNOSIS — I4891 Unspecified atrial fibrillation: Secondary | ICD-10-CM

## 2012-12-07 DIAGNOSIS — J4489 Other specified chronic obstructive pulmonary disease: Secondary | ICD-10-CM | POA: Insufficient documentation

## 2012-12-07 DIAGNOSIS — J449 Chronic obstructive pulmonary disease, unspecified: Secondary | ICD-10-CM | POA: Insufficient documentation

## 2012-12-07 DIAGNOSIS — R0989 Other specified symptoms and signs involving the circulatory and respiratory systems: Secondary | ICD-10-CM | POA: Insufficient documentation

## 2012-12-07 DIAGNOSIS — R0609 Other forms of dyspnea: Secondary | ICD-10-CM | POA: Insufficient documentation

## 2012-12-07 DIAGNOSIS — F17200 Nicotine dependence, unspecified, uncomplicated: Secondary | ICD-10-CM | POA: Insufficient documentation

## 2012-12-07 DIAGNOSIS — R5383 Other fatigue: Secondary | ICD-10-CM | POA: Insufficient documentation

## 2012-12-07 DIAGNOSIS — R5381 Other malaise: Secondary | ICD-10-CM | POA: Insufficient documentation

## 2012-12-07 DIAGNOSIS — I129 Hypertensive chronic kidney disease with stage 1 through stage 4 chronic kidney disease, or unspecified chronic kidney disease: Secondary | ICD-10-CM | POA: Insufficient documentation

## 2012-12-07 DIAGNOSIS — I5022 Chronic systolic (congestive) heart failure: Secondary | ICD-10-CM | POA: Insufficient documentation

## 2012-12-07 DIAGNOSIS — N189 Chronic kidney disease, unspecified: Secondary | ICD-10-CM | POA: Insufficient documentation

## 2012-12-07 DIAGNOSIS — R0600 Dyspnea, unspecified: Secondary | ICD-10-CM

## 2012-12-07 NOTE — Progress Notes (Signed)
Echocardiogram performed.  

## 2012-12-14 ENCOUNTER — Ambulatory Visit: Payer: Medicare Other

## 2012-12-16 ENCOUNTER — Other Ambulatory Visit: Payer: Self-pay | Admitting: Internal Medicine

## 2012-12-18 ENCOUNTER — Other Ambulatory Visit: Payer: Self-pay | Admitting: *Deleted

## 2012-12-18 NOTE — Telephone Encounter (Signed)
I refilled this medication on 11/25/12 with 5 additional refills.  Please confirm receipt by pharmacy.

## 2012-12-21 ENCOUNTER — Telehealth: Payer: Self-pay | Admitting: Internal Medicine

## 2012-12-21 ENCOUNTER — Other Ambulatory Visit: Payer: Self-pay | Admitting: *Deleted

## 2012-12-21 NOTE — Telephone Encounter (Signed)
Last filled 7/9 at visit, wants to pick up fri 8/9

## 2012-12-21 NOTE — Telephone Encounter (Signed)
Follow Up    Pt following up on a call he received earlier.

## 2012-12-21 NOTE — Telephone Encounter (Signed)
Pt had put in old script #, they did have the script from July on hold, they will call him

## 2012-12-24 MED ORDER — OXYCODONE-ACETAMINOPHEN 10-325 MG PO TABS: 1.0000 | ORAL_TABLET | Freq: Four times a day (QID) | ORAL | Status: DC | PRN

## 2012-12-24 NOTE — Telephone Encounter (Signed)
Refill printed and signed - nurse to complete. 

## 2012-12-28 ENCOUNTER — Ambulatory Visit (INDEPENDENT_AMBULATORY_CARE_PROVIDER_SITE_OTHER): Payer: Medicare Other | Admitting: Internal Medicine

## 2012-12-28 ENCOUNTER — Encounter: Payer: Self-pay | Admitting: Internal Medicine

## 2012-12-28 VITALS — BP 124/68 | HR 66 | Ht 75.0 in | Wt 163.0 lb

## 2012-12-28 DIAGNOSIS — F17200 Nicotine dependence, unspecified, uncomplicated: Secondary | ICD-10-CM

## 2012-12-28 DIAGNOSIS — I10 Essential (primary) hypertension: Secondary | ICD-10-CM

## 2012-12-28 DIAGNOSIS — I4891 Unspecified atrial fibrillation: Secondary | ICD-10-CM

## 2012-12-28 DIAGNOSIS — I5022 Chronic systolic (congestive) heart failure: Secondary | ICD-10-CM

## 2012-12-28 DIAGNOSIS — I495 Sick sinus syndrome: Secondary | ICD-10-CM

## 2012-12-28 LAB — BASIC METABOLIC PANEL
CO2: 22 mEq/L (ref 19–32)
Calcium: 8.6 mg/dL (ref 8.4–10.5)
Chloride: 112 mEq/L (ref 96–112)
Sodium: 144 mEq/L (ref 135–145)

## 2012-12-28 MED ORDER — LISINOPRIL 5 MG PO TABS: 5.0000 mg | ORAL_TABLET | Freq: Every day | ORAL | Status: DC

## 2012-12-28 MED ORDER — METOPROLOL SUCCINATE ER 50 MG PO TB24: 50.0000 mg | ORAL_TABLET | Freq: Every day | ORAL | Status: DC

## 2012-12-28 NOTE — Patient Instructions (Signed)
Your physician recommends that you schedule a follow-up appointment in: 6 weeks with Dr Johney Frame  Your physician recommends that you return for lab work today: [facility]  Your physician has recommended you make the following change in your medication:  1) Stop Diltiazem 2) Start lisinopril 5mg  daily 3) Start Toprol 50mg  daily

## 2012-12-28 NOTE — Progress Notes (Signed)
PCP:  [name], MD  The patient presents today for cardiology followup.  Since last being seen in our clinic, the patient reports doing very well.  Today, he denies symptoms of palpitations, chest pain, shortness of breath, orthopnea, PND, lower extremity edema, dizziness, presyncope, syncope, or neurologic sequela.  The patient feels that he is tolerating medications without difficulties and is otherwise without complaint today.   Past Medical History  Diagnosis Date  . Hypertension   . Renal insufficiency     Renal U/S 12/04/2009 showed no pathological findings. Labs 12/04/2009 include normal ESR, C3, C4; neg ANA; SPEP showed nonspecific increase in the alpha-2 region with no M-spike; UPEP showed no monoclonal free light chains; urine IFE showed polyclonal increase in feree Kappa and/or free Lambda light chains.  . Chronic systolic heart failure   . Depression   . GERD (gastroesophageal reflux disease)   . Portal vein thrombosis   . Avascular necrosis     right hip s/p replacement  . Gastritis   . Alcohol abuse   . Erectile dysfunction   . Bipolar disorder   . Hydrocele, unspecified   . Intermittent claudication   . Edema   . Lung nodule     Chest CT scan on 12/14/2008 showed a nodular opacity at the left lung base felt to most likely represent scarring.  Follow-up chest CT scan on 06/02/2010 showed parenchymal scarring in the left apex, left  lower lobe, and lingula; some of this scarring at the left base had a nodular appearance, unchanged.  . 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  Thyroid scan 08/28/2009 was normal with no focal areas of abnormal increased or decreased activity seen; the uptake of I 131 sodium iodide at 24 hours was 5.7%.  TSH and free T4 normalized by 08/16/2009.  [name] Pneumonia   . Intestinal obstruction   . Chronic systolic heart failure   . Other primary cardiomyopathies     EF 25% by echo 7/14  . COPD  (chronic obstructive pulmonary disease)   . Clostridium difficile colitis   . E coli bacteremia   . Cluster headaches   . DVT (deep venous thrombosis)     He has hypercoagulability with multiple occluded venous vessels  . Pleural plaque     Chest CT on 06/02/2010 showed stable extensive calcified pleural plaques involving the left hemithorax, consistent with asbestos related pleural disease.  . Weight loss     Normal colonoscopy by Dr. [name] on 11/06/2010.  . 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 Atrial fibrillation     Paroxysmal, failed medical therapy with amiodarone but has done very well with multaq  . Anginal pain     "every once in awhile now" (03/17/2012)  . Pacemaker   . Shortness of breath     "all the time" (03/17/2012)  . Osteoarthritis   . Spondylosis   . Chronic lower back pain     "cause of my hip" (03/17/2012)   Past Surgical History  Procedure Laterality Date  . Left hip bipolar hemiarthroplasty  09/09/2002    By Dr. Kristeen Miss  . Insert / replace / remove pacemaker  02/2003    By Dr. Charolette Child, gen change by Fawn Kirk  . Total hip arthroplasty  03/08/2008    right; By Dr. Charlesetta Shanks    Current Outpatient Prescriptions  Medication Sig Dispense Refill  . albuterol (PROVENTIL HFA;VENTOLIN HFA) 108 (  90 BASE) MCG/ACT inhaler Inhale 2 puffs into the lungs every 6 (six) hours as needed for wheezing or shortness of breath.      [name] buPROPion (WELLBUTRIN) 75 MG tablet Take 1 tablet (75 mg total) by mouth 2 (two) times daily.  60 tablet  5  . diltiazem (CARDIZEM CD) 240 MG 24 hr capsule Take 1 capsule (240 mg total) by mouth daily.  60 capsule  3  . Ensure Plus (ENSURE PLUS) LIQD Take 237 mLs by mouth 2 (two) times daily between meals.  60 Can  5  . MULTAQ 400 MG tablet TAKE 1 TABLET BY MOUTH TWICE A DAY WITH A MEAL  180 tablet  1  . omeprazole (PRILOSEC) 20 MG capsule Take 20 mg by mouth 2 (two) times daily.      [name] oxyCODONE-acetaminophen  (PERCOCET) 10-325 MG per tablet Take 1 tablet by mouth every 6 (six) hours as needed for pain.  120 tablet  0  . tiotropium (SPIRIVA) 18 MCG inhalation capsule Place 1 capsule (18 mcg total) into inhaler and inhale daily.  30 capsule  0  . warfarin (COUMADIN) 5 MG tablet Take as directed by anticoagulation clinic provider.       No current facility-administered medications for this visit.    Allergies  Allergen Reactions  . Penicillins Rash    History   Social History  . Marital Status: Widowed    Spouse Name: N/A    Number of Children: 10  . Years of Education: N/A   Occupational History  . RETIRED    Social History Main Topics  . Smoking status: Current Every Day Smoker -- 0.50 packs/day for 50 years    Types: Cigarettes  . Smokeless tobacco: Never Used     Comment: encouraged to quit today, he states "I have been trying to quit for 3 years"  . Alcohol Use: No     Comment: 03/17/2012 "don't drink alcohol now; used to; never had problems w/it"  . Drug Use: 1.00 per week    Special: Marijuana     Comment: 03/17/2012 "last marijuana was last week"  . Sexually Active: Yes   Other Topics Concern  . Not on file   Social History Narrative   Works as a Media planner part time   Smoker 1 pack last 1.5 days tobacco, smokes marijuana    Denies EtOH x 4 years (on 10/12/12)   12 kids    From [place]   [place] Veteran    12 grade education              Family History  Problem Relation Age of Onset  . Hypertension Mother   . Cancer Brother     Unsure of type.  . Asthma Mother   . Coronary artery disease Mother   . Colon cancer Neg Hx   . Lung cancer Neg Hx   . Prostate cancer Neg Hx     ROS-  All systems are reviewed and are negative except as outlined in the HPI above  Physical Exam: Filed Vitals:   12/28/12 1218  BP: 124/68  Pulse: 66  Height: 6\' 3"  (1.905 m)  Weight: 163 lb (73.936 kg)    GEN- The patient is well appearing, alert and oriented x 3 today.    Head- normocephalic, atraumatic Eyes-  Sclera clear, conjunctiva pink Ears- hearing intact Oropharynx- clear Neck- supple, no JVP Lymph- no cervical lymphadenopathy Lungs- Clear to ausculation bilaterally, normal work of breathing Heart- Regular  rate and rhythm, no murmurs, rubs or gallops, PMI not laterally displaced GI- soft, NT, ND, + BS Extremities- no clubbing, cyanosis, or edema Neuro- strength and sensation are intact  ekg 12/07/12- sinus rhythm nonspecific ST/T changes Echo 7/14 reviewed  Assessment and Plan:  1. Afib/ atrial flutter- presently in sinus with multaq.  I have tried multiple AADs with Mr Streight.  Presently, given difficulty with compliance and labile renal function, I would not recommend tikosyn or sotalol.  He has failed amiodarone previously.  He has done much better than expected with multaq and therefore I think that we really have to continue multaq and follow closely for now.  I did discuss this medicine with the patient and he understands increased risks of multaq with CHF.  Hopefully, with aggressive medical therapy his EF will improve.  2. Nonischemic CM- NYHA Class II presently Recent echo is reviewed.  I think that decline in EF is due to afib with RVR.  Compliance with Multaq is advised. Will add lisinopril and switch diltiazem to toprol and follow closely bmet today Titrate medicines as able and then repeat EF once optimized  3. Renal failure- recently admitted with acute on chronic renal failure due to dehydration.  Will repeat bmet today  4. Bradycardia Stable s/p PPM  5. HTN Stable Follow with above medicine changes  6. Tobacco Cessation is advised again today.  He is trying to quit

## 2012-12-30 ENCOUNTER — Encounter: Payer: Self-pay | Admitting: Internal Medicine

## 2012-12-30 ENCOUNTER — Ambulatory Visit: Payer: Medicare Other | Admitting: Internal Medicine

## 2013-01-05 ENCOUNTER — Encounter: Payer: Self-pay | Admitting: Internal Medicine

## 2013-01-05 ENCOUNTER — Ambulatory Visit (INDEPENDENT_AMBULATORY_CARE_PROVIDER_SITE_OTHER): Payer: Medicare Other | Admitting: Internal Medicine

## 2013-01-05 VITALS — BP 126/81 | HR 50 | Temp 97.8°F | Ht 75.5 in | Wt 161.9 lb

## 2013-01-05 DIAGNOSIS — I10 Essential (primary) hypertension: Secondary | ICD-10-CM

## 2013-01-05 DIAGNOSIS — I4891 Unspecified atrial fibrillation: Secondary | ICD-10-CM

## 2013-01-05 DIAGNOSIS — I495 Sick sinus syndrome: Secondary | ICD-10-CM

## 2013-01-05 DIAGNOSIS — Z23 Encounter for immunization: Secondary | ICD-10-CM

## 2013-01-05 DIAGNOSIS — Z299 Encounter for prophylactic measures, unspecified: Secondary | ICD-10-CM

## 2013-01-05 NOTE — Assessment & Plan Note (Addendum)
BP Readings from Last 3 Encounters:  01/05/13 126/81  12/28/12 124/68  11/30/12 100/64    Lab Results  Component Value Date   NA 144 12/28/2012   K 3.9 12/28/2012   CREATININE 1.8* 12/28/2012    Assessment: Blood pressure control: controlled Progress toward BP goal:  at goal Comments: none  Plan: Medications:  continue current medications Educational resources provided: brochure;handout Self management tools provided:   Other plans: recently added lisinopril 5 mg daily. Creatinine and potassium were stable last week

## 2013-01-05 NOTE — Assessment & Plan Note (Signed)
Patient is bradycardia, is currently asymptomatic. However, he reports some symptoms during morning hours when he just wakes up. No chest pain. No dizziness. Reviewed his chart with Dr. Josem Kaufmann and indeed, confirmed that his pacemaker is set at 50. It is unclear whether it has fired recently. It is possible that his bradycardia is related to the Toprol, which was recently started.  Plan  - I will send a message to Dr. Johney Frame requesting for his opinion - particularly regarding the dose of Metoprolol dose and whether his PM would need to be set at a higher rate. - Given patient mild symptoms, he can follow up as outpatient.

## 2013-01-05 NOTE — Patient Instructions (Signed)
General Instructions: Please continue with your medications  I will send a message to Dr Johney Frame to ask him whether he needs to see soon  Please follow up with Dr Alexandria Lodge for your coumadin  Please also follow up her in one month    Treatment Goals:  Goals (1 Years of Data) as of 01/05/13         As of Today 12/28/12 11/30/12 11/25/12 11/02/12     Blood Pressure    . Blood Pressure < 140/90  126/81 124/68 100/64 121/75 118/68      Progress Toward Treatment Goals:  Treatment Goal 01/05/2013  Blood pressure at goal  Stop smoking -  Prevent falls at goal    Self Care Goals & Plans:  Self Care Goal 01/05/2013  Manage my medications take my medicines as prescribed; bring my medications to every visit; refill my medications on time; follow the sick day instructions if I am sick  Monitor my health -  Eat healthy foods eat more vegetables; eat fruit for snacks and desserts; eat smaller portions  Be physically active take a walk every day; find an activity I enjoy       Care Management & Community Referrals:  Referral 11/25/2012  Referrals made for care management support -  Referrals made to community resources none

## 2013-01-05 NOTE — Progress Notes (Signed)
Patient ID: Travis Heyde., male   DOB: 1945/06/12, 67 y.o.   MRN: [id number]   Subjective:   HPI: [name] is a 67 y.o. gentleman with past medical history of hypertension, atrial fibrillation, on chronic Coumadin therapy, bradycardia-tachycardia syndrome status post pacemaker placement, set at 50 beats per minute, COPD, osteoarthritis, among other health problems, presents to the clinic for routine followup visit.  He reports, however, that recently he has not that he has episodes of "hyperventilation", which lasts for about 5 minutes during the morning hours. The symptoms have been present for one week. He also reports symptoms of palpitations during those episodes, but without chest pain, wheezing, orthopnea, or PND. Patient has chronic dizziness whenever he stands up, but he denies this having increased recently with his current symptoms.  On 12/28/2012 he was evaluated by Dr. Johney Frame of cardiology switched his Cardizem to Toprol 50 mg, and he also added lisinopril 5 mg daily. The patient reports compliance with his new medications. The patient is also taking Multaq 400 mg daily.   Past Medical History  Diagnosis Date  . Hypertension   . Renal insufficiency     Renal U/S 12/04/2009 showed no pathological findings. Labs 12/04/2009 include normal ESR, C3, C4; neg ANA; SPEP showed nonspecific increase in the alpha-2 region with no M-spike; UPEP showed no monoclonal free light chains; urine IFE showed polyclonal increase in feree Kappa and/or free Lambda light chains.  . Chronic systolic heart failure   . Depression   . GERD (gastroesophageal reflux disease)   . Portal vein thrombosis   . Avascular necrosis     right hip s/p replacement  . Gastritis   . Alcohol abuse   . Erectile dysfunction   . Bipolar disorder   . Hydrocele, unspecified   . Intermittent claudication   . Edema   . Lung nodule     Chest CT scan on 12/14/2008 showed a nodular opacity at the left lung base  felt to most likely represent scarring.  Follow-up chest CT scan on 06/02/2010 showed parenchymal scarring in the left apex, left  lower lobe, and lingula; some of this scarring at the left base had a nodular appearance, unchanged.  . 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  Thyroid scan 08/28/2009 was normal with no focal areas of abnormal increased or decreased activity seen; the uptake of I 131 sodium iodide at 24 hours was 5.7%.  TSH and free T4 normalized by 08/16/2009.  [name] Pneumonia   . Intestinal obstruction   . Chronic systolic heart failure   . Other primary cardiomyopathies     EF 25% by echo 7/14  . COPD (chronic obstructive pulmonary disease)   . Clostridium difficile colitis   . E coli bacteremia   . Cluster headaches   . DVT (deep venous thrombosis)     He has hypercoagulability with multiple occluded venous vessels  . Pleural plaque     Chest CT on 06/02/2010 showed stable extensive calcified pleural plaques involving the left hemithorax, consistent with asbestos related pleural disease.  . Weight loss     Normal colonoscopy by Dr. [name] on 11/06/2010.  . 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 Atrial fibrillation     Paroxysmal, failed medical therapy with amiodarone but has done very well with multaq  . Anginal pain     "every once in awhile now" (03/17/2012)  . Pacemaker   .  Shortness of breath     "all the time" (03/17/2012)  . Osteoarthritis   . Spondylosis   . Chronic lower back pain     "cause of my hip" (03/17/2012)   Current Outpatient Prescriptions  Medication Sig Dispense Refill  . albuterol (PROVENTIL HFA;VENTOLIN HFA) 108 (90 BASE) MCG/ACT inhaler Inhale 2 puffs into the lungs every 6 (six) hours as needed for wheezing or shortness of breath.      [name] buPROPion (WELLBUTRIN) 75 MG tablet Take 1 tablet (75 mg total) by mouth 2 (two) times daily.  60 tablet  5  . Ensure Plus (ENSURE PLUS) LIQD  Take 237 mLs by mouth 2 (two) times daily between meals.  60 Can  5  . lisinopril (PRINIVIL,ZESTRIL) 5 MG tablet Take 1 tablet (5 mg total) by mouth daily.  90 tablet  3  . metoprolol succinate (TOPROL-XL) 50 MG 24 hr tablet Take 1 tablet (50 mg total) by mouth daily. Take with or immediately following a meal.  90 tablet  3  . MULTAQ 400 MG tablet TAKE 1 TABLET BY MOUTH TWICE A DAY WITH A MEAL  180 tablet  1  . omeprazole (PRILOSEC) 20 MG capsule Take 20 mg by mouth 2 (two) times daily.      [name] oxyCODONE-acetaminophen (PERCOCET) 10-325 MG per tablet Take 1 tablet by mouth every 6 (six) hours as needed for pain.  120 tablet  0  . tiotropium (SPIRIVA) 18 MCG inhalation capsule Place 1 capsule (18 mcg total) into inhaler and inhale daily.  30 capsule  0  . warfarin (COUMADIN) 5 MG tablet Take as directed by anticoagulation clinic provider.       No current facility-administered medications for this visit.   Family History  Problem Relation Age of Onset  . Hypertension Mother   . Cancer Brother     Unsure of type.  . Asthma Mother   . Coronary artery disease Mother   . Colon cancer Neg Hx   . Lung cancer Neg Hx   . Prostate cancer Neg Hx    History   Social History  . Marital Status: Widowed    Spouse Name: N/A    Number of Children: 10  . Years of Education: N/A   Occupational History  . RETIRED    Social History Main Topics  . Smoking status: Current Every Day Smoker -- 0.50 packs/day for 50 years    Types: Cigarettes  . Smokeless tobacco: Never Used     Comment: encouraged to quit today, he states "I have been trying to quit for 3 years"  . Alcohol Use: No     Comment: 03/17/2012 "don't drink alcohol now; used to; never had problems w/it"  . Drug Use: 1.00 per week    Special: Marijuana     Comment: 03/17/2012 "last marijuana was last week"  . Sexual Activity: Yes   Other Topics Concern  . None   Social History Narrative   Works as a Media planner part time   Smoker 1  pack last 1.5 days tobacco, smokes marijuana    Denies EtOH x 4 years (on 10/12/12)   12 kids    From [place]   [place] Veteran    12 grade education             Review of Systems: Constitutional: Denies fever, chills, diaphoresis, appetite change and fatigue.    Gastrointestinal: No abdominal pain, nausea, vomiting, bloody stools Genitourinary: No dysuria, frequency, hematuria, or flank  pain.  Musculoskeletal: No myalgias, back pain, joint swelling, arthralgias    Objective:  Physical Exam: Filed Vitals:   01/05/13 1548  BP: 126/81  Pulse: 50  Temp: 97.8 F (36.6 C)  TempSrc: Oral  Height: 6' 3.5" (1.918 m)  Weight: 161 lb 14.4 oz (73.437 kg)  SpO2: 98%   pulse rate noted to be low at 50 General: Well oriented, Well nourished. No acute distress. He uses a cane for walking  Lungs: CTA bilaterally. Heart: RRR; no extra sounds or murmurs  Abdomen: Non-distended, normal BS, soft, nontender; no hepatosplenomegaly  Extremities: No pedal edema. No joint swelling or tenderness. Neurologic: Alert and oriented x3. No obvious neurologic deficits.  Assessment & Plan:  I have discussed my assessment and plan  with Dr. Josem Kaufmann  as detailed under problem based charting.

## 2013-01-07 NOTE — Progress Notes (Signed)
Case discussed with Dr. Zada Girt at time of visit.  We reviewed the resident's history and exam and pertinent patient test results.  I agree with the assessment, diagnosis, and plan of care documented in the resident's note.  Hesitant to lower the dose of metoprolol given the tachy-brady and the concern for tachycardia.  Agree with getting Dr. Jenel Lucks opinion on change in metoprolol dose, increase in basal pacemaker rate, or continued observation.

## 2013-01-11 ENCOUNTER — Ambulatory Visit: Payer: Medicare Other

## 2013-01-12 ENCOUNTER — Encounter: Payer: Self-pay | Admitting: Internal Medicine

## 2013-01-19 ENCOUNTER — Other Ambulatory Visit: Payer: Self-pay | Admitting: *Deleted

## 2013-01-19 NOTE — Telephone Encounter (Signed)
Sept, oct and nov can be done and kept in triage til the due dates

## 2013-01-20 MED ORDER — OXYCODONE-ACETAMINOPHEN 10-325 MG PO TABS: 1.0000 | ORAL_TABLET | Freq: Four times a day (QID) | ORAL | Status: DC | PRN

## 2013-01-20 NOTE — Telephone Encounter (Signed)
Refill printed and signed - nurse to complete. 

## 2013-01-29 ENCOUNTER — Other Ambulatory Visit: Payer: Self-pay | Admitting: Nephrology

## 2013-02-03 ENCOUNTER — Ambulatory Visit
Admission: RE | Admit: 2013-02-03 | Discharge: 2013-02-03 | Disposition: A | Discharge status: Home / Self Care | Payer: Medicare Other | Source: Ambulatory Visit | Attending: Nephrology | Admitting: Nephrology

## 2013-02-05 ENCOUNTER — Ambulatory Visit (INDEPENDENT_AMBULATORY_CARE_PROVIDER_SITE_OTHER): Payer: Medicare Other | Admitting: Internal Medicine

## 2013-02-05 ENCOUNTER — Encounter: Payer: Self-pay | Admitting: Internal Medicine

## 2013-02-05 VITALS — BP 122/78 | HR 57 | Temp 97.2°F | Ht 75.5 in | Wt 153.7 lb

## 2013-02-05 DIAGNOSIS — N189 Chronic kidney disease, unspecified: Secondary | ICD-10-CM

## 2013-02-05 DIAGNOSIS — J449 Chronic obstructive pulmonary disease, unspecified: Secondary | ICD-10-CM

## 2013-02-05 DIAGNOSIS — Z7901 Long term (current) use of anticoagulants: Secondary | ICD-10-CM

## 2013-02-05 DIAGNOSIS — I4891 Unspecified atrial fibrillation: Secondary | ICD-10-CM

## 2013-02-05 DIAGNOSIS — I5022 Chronic systolic (congestive) heart failure: Secondary | ICD-10-CM

## 2013-02-05 LAB — PROTIME-INR: Prothrombin Time: 13.1 seconds (ref 11.6–15.2)

## 2013-02-05 NOTE — Assessment & Plan Note (Signed)
Patient is doing well. His body weight has been stable. He does not have chest pain, shortness of breath, leg edema. Wil continue current regimen.

## 2013-02-05 NOTE — Patient Instructions (Addendum)
1. You have done great job in taking all your medications. I appreciate it very much. Please continue doing that. 2. Please continue to take your coumadin as Dr. Alexandria Lodge told you.  3. If you have worsening of your symptoms or new symptoms arise, please call the clinic [phone]), or go to the ER immediately if symptoms are severe.

## 2013-02-05 NOTE — Assessment & Plan Note (Signed)
Patient's chronic kidney disease is stable. His baseline creatinine is 1.7-2.5. His recent creatinine was 1.8 which is at baseline on 12/28/12. Continue to followup.

## 2013-02-05 NOTE — Assessment & Plan Note (Signed)
His heart rate is well controlled. Today heart rate is 57. In previous visit, he had bradycardia. Today his heart rate is okay. He has an appointment with his cardiologist, Dr. Johney Frame on 9/22. Patient has not been taking his Coumadin consistently. His INR today is 1.01. He understands the risk of not taking Coumadin and knows how he should take it.  I had lengthy talk with patient about the importance of taking Coumadin consistently. He agreed to do his best to take his Coumadin as instructed by Dr. Alexandria Lodge.

## 2013-02-05 NOTE — Progress Notes (Signed)
Patient ID: [name]., male   DOB: [birth year], 67 y.o.   MRN: [id number] Subjective:   Patient ID: [name]. male   DOB: 1946-04-07 67 y.o.   MRN: [id number]  CC:   Follow up visit. HPI:  [name] is a 67 y.o. man with past medical history as outlined below, who presents for a followup visit today  1. A fib: Patient is currently taking metoprolol 50 mg daily and a Coumadin. His heart rate is a 57 today. His previous INR was 1.0 on 11/23/12. He has not been taking his Coumadin consistently. He was supposed to take 10 mg Coumadin for 5 days each week and 7.5 mg Coumadin for 2 days each week per Dr. Saralyn Pilar instruction. He has been taking these medications intermittently. Today his INR is 1.01. He does not have chest pain, shortness of breath, palpitation, leg edema.  2. HTN: His blood pressure is well controlled. Today blood pressure is 122/78. He isn't taking lisinopril 5 mg daily and metoprolol 50 mg daily which is also for A fib rate control.  3. COPD: Patient is to use an albuterol inhaler and spiriva inhalers. He does not have shortness of breath, chest pain, wheezing.  4. CHF: Patient has a nonischemic cardiomyopathy. His 2-D echo on 12/07/12 showed EF 25-30% with a grade 1 diastolic dysfunction. He had a pacemaker placed. He has been followed up by Dr. Johney Frame. He has an appointment with Dr.Allred on 9/22. His body weight decreased by 8 pounds since 01/05/13.   ROS:  Denies fever, chills, fatigue, headaches,  cough, chest pain, SOB,  abdominal pain, diarrhea, constipation, dysuria, urgency, frequency, hematuria.     Past Medical History  Diagnosis Date  . Hypertension   . Renal insufficiency     Renal U/S 12/04/2009 showed no pathological findings. Labs 12/04/2009 include normal ESR, C3, C4; neg ANA; SPEP showed nonspecific increase in the alpha-2 region with no M-spike; UPEP showed no monoclonal free light chains; urine IFE showed polyclonal increase in feree Kappa  and/or free Lambda light chains.  . Chronic systolic heart failure   . Depression   . GERD (gastroesophageal reflux disease)   . Portal vein thrombosis   . Avascular necrosis     right hip s/p replacement  . Gastritis   . Alcohol abuse   . Erectile dysfunction   . Bipolar disorder   . Hydrocele, unspecified   . Intermittent claudication   . Edema   . Lung nodule     Chest CT scan on 12/14/2008 showed a nodular opacity at the left lung base felt to most likely represent scarring.  Follow-up chest CT scan on 06/02/2010 showed parenchymal scarring in the left apex, left  lower lobe, and lingula; some of this scarring at the left base had a nodular appearance, unchanged.  . 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  Thyroid scan 08/28/2009 was normal with no focal areas of abnormal increased or decreased activity seen; the uptake of I 131 sodium iodide at 24 hours was 5.7%.  TSH and free T4 normalized by 08/16/2009.  [name] Pneumonia   . Intestinal obstruction   . Chronic systolic heart failure   . Other primary cardiomyopathies     EF 25% by echo 7/14  . COPD (chronic obstructive pulmonary disease)   . Clostridium difficile colitis   . E coli bacteremia   . Cluster headaches   . DVT (deep venous  thrombosis)     He has hypercoagulability with multiple occluded venous vessels  . Pleural plaque     Chest CT on 06/02/2010 showed stable extensive calcified pleural plaques involving the left hemithorax, consistent with asbestos related pleural disease.  . Weight loss     Normal colonoscopy by Dr. [name] on 11/06/2010.  . 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 Atrial fibrillation     Paroxysmal, failed medical therapy with amiodarone but has done very well with multaq  . Anginal pain     "every once in awhile now" (03/17/2012)  . Pacemaker   . Shortness of breath     "all the time" (03/17/2012)  . Osteoarthritis   .  Spondylosis   . Chronic lower back pain     "cause of my hip" (03/17/2012)   Current Outpatient Prescriptions  Medication Sig Dispense Refill  . albuterol (PROVENTIL HFA;VENTOLIN HFA) 108 (90 BASE) MCG/ACT inhaler Inhale 2 puffs into the lungs every 6 (six) hours as needed for wheezing or shortness of breath.      [name] buPROPion (WELLBUTRIN) 75 MG tablet Take 1 tablet (75 mg total) by mouth 2 (two) times daily.  60 tablet  5  . lisinopril (PRINIVIL,ZESTRIL) 5 MG tablet Take 1 tablet (5 mg total) by mouth daily.  90 tablet  3  . metoprolol succinate (TOPROL-XL) 50 MG 24 hr tablet Take 1 tablet (50 mg total) by mouth daily. Take with or immediately following a meal.  90 tablet  3  . MULTAQ 400 MG tablet TAKE 1 TABLET BY MOUTH TWICE A DAY WITH A MEAL  180 tablet  1  . omeprazole (PRILOSEC) 20 MG capsule Take 20 mg by mouth 2 (two) times daily.      [name] oxyCODONE-acetaminophen (PERCOCET) 10-325 MG per tablet Take 1 tablet by mouth every 6 (six) hours as needed for pain.  120 tablet  0  . tiotropium (SPIRIVA) 18 MCG inhalation capsule Place 1 capsule (18 mcg total) into inhaler and inhale daily.  30 capsule  0  . warfarin (COUMADIN) 5 MG tablet Take as directed by anticoagulation clinic provider.      . Ensure Plus (ENSURE PLUS) LIQD Take 237 mLs by mouth 2 (two) times daily between meals.  60 Can  5   No current facility-administered medications for this visit.   Family History  Problem Relation Age of Onset  . Hypertension Mother   . Cancer Brother     Unsure of type.  . Asthma Mother   . Coronary artery disease Mother   . Colon cancer Neg Hx   . Lung cancer Neg Hx   . Prostate cancer Neg Hx    History   Social History  . Marital Status: Widowed    Spouse Name: N/A    Number of Children: 10  . Years of Education: N/A   Occupational History  . RETIRED    Social History Main Topics  . Smoking status: Current Every Day Smoker -- 0.50 packs/day for 50 years    Types: Cigarettes  .  Smokeless tobacco: Never Used     Comment: encouraged to quit today, he states "I have been trying to quit for 3 years"  . Alcohol Use: No     Comment: 03/17/2012 "don't drink alcohol now; used to; never had problems w/it"  . Drug Use: 1.00 per week    Special: Marijuana     Comment: 03/17/2012 "last marijuana was last week"  .  Sexual Activity: Yes   Other Topics Concern  . None   Social History Narrative   Works as a Media planner part time   Smoker 1 pack last 1.5 days tobacco, smokes marijuana    Denies EtOH x 4 years (on 10/12/12)   12 kids    From [place]   [place] Veteran    12 grade education              Review of Systems: Full 14-point review of systems otherwise negative. See HPI.   Objective:  Physical Exam: Filed Vitals:   02/05/13 1508  BP: 122/78  Pulse: 57  Temp: 97.2 F (36.2 C)  TempSrc: Oral  Height: 6' 3.5" (1.918 m)  Weight: 153 lb 11.2 oz (69.718 kg)  SpO2: 100%   Constitutional: Vital signs reviewed.  Patient is a well-developed and well-nourished, in no acute distress and cooperative with exam.   HEENT:  Head: Normocephalic and atraumatic Mouth: no erythema or exudates, MMM Eyes: PERRL, EOMI, conjunctivae normal, No scleral icterus.  Neck: Supple, Trachea midline normal ROM, No JVD  Cardiovascular: RRR, S1 normal, S2 normal, no MRG, pulses symmetric and intact bilaterally Pulmonary/Chest: CTAB, no wheezes, rales, or rhonchi Abdominal: Soft. Non-tender, non-distended, bowel sounds are normal, no masses, organomegaly, or guarding present.  Musculoskeletal: No joint deformities, erythema, or stiffness, ROM full and non-tender Extremities: No leg edema Hematology: no cervical, inginal, or axillary adenopathy.  Neurological: A&O x3, Strength is normal and symmetric bilaterally, cranial nerve II-XII are grossly intact, no focal motor deficit, sensory intact to light touch bilaterally.  Skin: Warm, dry and intact. No rash, cyanosis, or clubbing.   Psychiatric: Normal mood and affect. No suicidal or homicidal ideation.  Assessment & Plan:

## 2013-02-05 NOTE — Assessment & Plan Note (Signed)
It is stable. He does not have shortness of breath, cough, wheezing. His lung auscultation is clear bilaterally. Will continue current regimen.

## 2013-02-08 ENCOUNTER — Encounter: Payer: Medicare Other | Admitting: Internal Medicine

## 2013-02-11 ENCOUNTER — Encounter: Payer: Medicare Other | Admitting: Internal Medicine

## 2013-02-12 NOTE — Progress Notes (Signed)
Case discussed with Dr. Niu soon after the resident saw the patient.  We reviewed the resident's history and exam and pertinent patient test results.  I agree with the assessment, diagnosis, and plan of care documented in the resident's note. 

## 2013-02-15 ENCOUNTER — Ambulatory Visit: Payer: Medicare Other

## 2013-02-15 ENCOUNTER — Other Ambulatory Visit: Payer: Self-pay | Admitting: *Deleted

## 2013-02-15 MED ORDER — WARFARIN SODIUM 5 MG PO TABS: ORAL_TABLET | ORAL | Status: DC

## 2013-02-15 NOTE — Telephone Encounter (Signed)
Called and left pt a message to call clinic asap, he then called back and wants to pick up pain script Friday, will check and see if dr groce can see pt then

## 2013-02-15 NOTE — Telephone Encounter (Signed)
I refilled 2 week supply of warfarin; patient should resume previous dosing schedule as per Dr. Alexandria Lodge and he should see Dr. Alexandria Lodge in the anticoagulation clinic within 1 week - it is important for him to come in regularly for monitoring and dose adjustment.

## 2013-02-19 ENCOUNTER — Encounter: Payer: Self-pay | Admitting: Internal Medicine

## 2013-02-19 ENCOUNTER — Other Ambulatory Visit: Payer: Self-pay | Admitting: *Deleted

## 2013-02-19 MED ORDER — OXYCODONE-ACETAMINOPHEN 10-325 MG PO TABS: 1.0000 | ORAL_TABLET | Freq: Four times a day (QID) | ORAL | Status: DC | PRN

## 2013-02-19 NOTE — Telephone Encounter (Signed)
Given 120 tablets of Oxycodone-APAP, RN Myriam Jacobson discussed this with Dr. Meredith Pel PCP who says its appropriate.

## 2013-02-22 ENCOUNTER — Ambulatory Visit (INDEPENDENT_AMBULATORY_CARE_PROVIDER_SITE_OTHER): Payer: Medicare Other | Admitting: Pharmacist

## 2013-02-22 DIAGNOSIS — I81 Portal vein thrombosis: Secondary | ICD-10-CM

## 2013-02-22 DIAGNOSIS — Z7901 Long term (current) use of anticoagulants: Secondary | ICD-10-CM

## 2013-02-22 MED ORDER — WARFARIN SODIUM 5 MG PO TABS: ORAL_TABLET | ORAL | Status: DC

## 2013-02-22 NOTE — Progress Notes (Signed)
Anti-Coagulation Progress Note  [name]. is a 67 y.o. male who is currently on an anti-coagulation regimen.    RECENT RESULTS: Recent results are below, the most recent result is correlated with a dose of 65 mg. per week: Lab Results  Component Value Date   INR 1.50 02/22/2013   INR 1.01 02/05/2013   INR 1.0 11/23/2012    ANTI-COAG DOSE: Anticoagulation Dose Instructions as of 02/22/2013     [name] Tue Wed Thu Fri Sat   New Dose 10 mg 10 mg 10 mg 7.5 mg 10 mg 7.5 mg 10 mg       ANTICOAG SUMMARY: Anticoagulation Episode Summary   Current INR goal   Next INR check 03/08/2013  INR from last check 1.50 (02/22/2013)  Weekly max dose   Target end date   INR check location   Preferred lab   Send INR reminders to ANTICOAG IMP   Indications  Long-term (current) use of anticoagulants [V58.61] Atrila fibrillation (Resolved) [427.31] Thrombosis 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]        Comments       Anticoagulation Care Providers   Provider Role Specialty Phone number   [name], MD  Internal Medicine [phone]      ANTICOAG TODAY: Anticoagulation Summary as of 02/22/2013   INR goal   Selected INR 1.50 (02/22/2013)  Next INR check 03/08/2013  Target end date    Indications  Long-term (current) use of anticoagulants [V58.61] Atrila fibrillation (Resolved) [427.31] Thrombosis 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]      Anticoagulation Episode Summary   INR check location    Preferred lab    Send INR reminders to ANTICOAG [facility]   Comments     Anticoagulation Care Providers   Provider Role Specialty Phone number   [name], MD  Internal Medicine [phone]      PATIENT INSTRUCTIONS: Patient Instructions  Patient instructed to take medications as defined in the Anti-coagulation Track section of this encounter.  Patient instructed to take today's dose.  Patient verbalized understanding of these instructions.       FOLLOW-UP Return in 2 weeks (on 03/08/2013) for Follow  up INR at 2:30PM.  [name], III Pharm.D., CACP

## 2013-02-22 NOTE — Patient Instructions (Signed)
Patient instructed to take medications as defined in the Anti-coagulation Track section of this encounter.  Patient instructed to take today's dose.  Patient verbalized understanding of these instructions.    

## 2013-02-23 NOTE — Progress Notes (Signed)
Indication: Atrial fibrillation. Duration: Lifelong. INR: Below target. Agree with Dr. Groce's assessment and plan. 

## 2013-03-08 ENCOUNTER — Encounter: Payer: Self-pay | Admitting: Internal Medicine

## 2013-03-08 ENCOUNTER — Ambulatory Visit: Payer: Medicare Other | Admitting: Internal Medicine

## 2013-03-08 ENCOUNTER — Ambulatory Visit: Payer: Medicare Other

## 2013-03-08 ENCOUNTER — Ambulatory Visit (INDEPENDENT_AMBULATORY_CARE_PROVIDER_SITE_OTHER): Payer: Medicare Other | Admitting: Pharmacist

## 2013-03-08 DIAGNOSIS — Z7901 Long term (current) use of anticoagulants: Secondary | ICD-10-CM

## 2013-03-08 DIAGNOSIS — I81 Portal vein thrombosis: Secondary | ICD-10-CM

## 2013-03-08 NOTE — Progress Notes (Signed)
Patient instructed to take medications as defined in the Anti-coagulation Track section of this encounter.  Patient instructed to take today's dose.  Patient verbalized understanding of these instructions.    

## 2013-03-08 NOTE — Progress Notes (Signed)
Anti-Coagulation Progress Note  [name]. is a 67 y.o. male who is currently on an anti-coagulation regimen.    RECENT RESULTS: Recent results are below, the most recent result is correlated with a dose of 65 mg. per week: Lab Results  Component Value Date   INR 4.40 03/08/2013   INR 1.50 02/22/2013   INR 1.01 02/05/2013    ANTI-COAG DOSE: Anticoagulation Dose Instructions as of 03/08/2013     [name] Tue Wed Thu Fri Sat   New Dose 7.5 mg 10 mg 7.5 mg 7.5 mg 10 mg 7.5 mg 7.5 mg       ANTICOAG SUMMARY: Anticoagulation Episode Summary   Current INR goal   Next INR check 03/15/2013  INR from last check 4.40 (03/08/2013)  Weekly max dose   Target end date   INR check location   Preferred lab   Send INR reminders to ANTICOAG [facility]   Indications  Long-term (current) use of anticoagulants [V58.61] Atrila fibrillation (Resolved) [427.31] Thrombosis 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]        Comments       Anticoagulation Care Providers   Provider Role Specialty Phone number   [name], MD  Internal Medicine [phone]      ANTICOAG TODAY: Anticoagulation Summary as of 03/08/2013   INR goal   Selected INR 4.40 (03/08/2013)  Next INR check 03/15/2013  Target end date    Indications  Long-term (current) use of anticoagulants [V58.61] Atrila fibrillation (Resolved) [427.31] Thrombosis 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]      Anticoagulation Episode Summary   INR check location    Preferred lab    Send INR reminders to ANTICOAG IMP   Comments     Anticoagulation Care Providers   Provider Role Specialty Phone number   [name], MD  Internal Medicine [phone]      PATIENT INSTRUCTIONS: Patient Instructions  Patient instructed to take medications as defined in the Anti-coagulation Track section of this encounter.  Patient instructed to take today's dose.  Patient verbalized understanding of these instructions.       FOLLOW-UP Return in 7 days (on 03/15/2013)  for Follow up INR at 3:30PM.  [name], III Pharm.D., CACP  Anti-Coagulation Progress Note  [name] Sr. is a 67 y.o. male who is currently on an anti-coagulation regimen.    RECENT RESULTS: Recent results are below, the most recent result is correlated with a dose of 45 mg. per week: Lab Results  Component Value Date   INR 4.40 03/08/2013   INR 1.50 02/22/2013   INR 1.01 02/05/2013    ANTI-COAG DOSE: Anticoagulation Dose Instructions as of 03/08/2013     [name] Tue Wed Thu Fri Sat   New Dose 7.5 mg 10 mg 7.5 mg 7.5 mg 10 mg 7.5 mg 7.5 mg       ANTICOAG SUMMARY: Anticoagulation Episode Summary   Current INR goal   Next INR check 03/15/2013  INR from last check 4.40 (03/08/2013)  Weekly max dose   Target end date   INR check location   Preferred lab   Send INR reminders to ANTICOAG [facility]   Indications  Long-term (current) use of anticoagulants [V58.61] Atrila fibrillation (Resolved) [427.31] Thrombosis 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]        Comments       Anticoagulation Care Providers   Provider Role Specialty Phone number   [name], MD  Internal Medicine [phone]      ANTICOAG TODAY: Anticoagulation  Summary as of 03/08/2013   INR goal   Selected INR 4.40 (03/08/2013)  Next INR check 03/15/2013  Target end date    Indications  Long-term (current) use of anticoagulants [V58.61] Atrila fibrillation (Resolved) [427.31] Thrombosis 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]      Anticoagulation Episode Summary   INR check location    Preferred lab    Send INR reminders to ANTICOAG IMP   Comments     Anticoagulation Care Providers   Provider Role Specialty Phone number   [name], MD  Internal Medicine [phone]      PATIENT INSTRUCTIONS: Patient Instructions  Patient instructed to take medications as defined in the Anti-coagulation Track section of this encounter.  Patient instructed to take today's dose.  Patient verbalized understanding of these  instructions.       FOLLOW-UP Return in 7 days (on 03/15/2013) for Follow up INR at 3:30PM.  [name], III Pharm.D., CACP

## 2013-03-08 NOTE — Patient Instructions (Signed)
Patient instructed to take medications as defined in the Anti-coagulation Track section of this encounter.  Patient instructed to take today's dose.  Patient verbalized understanding of these instructions.    

## 2013-03-09 NOTE — Progress Notes (Signed)
Indication: Atrial fibrillation Duration: Lifelong INR: Above target.  Dr. Saralyn Pilar assessment and plan were reviewed and I agree with his documentation.

## 2013-03-15 ENCOUNTER — Ambulatory Visit (INDEPENDENT_AMBULATORY_CARE_PROVIDER_SITE_OTHER): Payer: Medicare Other | Admitting: Pharmacist

## 2013-03-15 DIAGNOSIS — I81 Portal vein thrombosis: Secondary | ICD-10-CM

## 2013-03-15 DIAGNOSIS — Z7901 Long term (current) use of anticoagulants: Secondary | ICD-10-CM

## 2013-03-15 MED ORDER — WARFARIN SODIUM 5 MG PO TABS: 7.5000 mg | ORAL_TABLET | Freq: Every day | ORAL | Status: DC

## 2013-03-15 NOTE — Patient Instructions (Signed)
Patient instructed to take medications as defined in the Anti-coagulation Track section of this encounter.  Patient instructed to take today's dose.  Patient verbalized understanding of these instructions.    

## 2013-03-15 NOTE — Progress Notes (Signed)
Indication: Atrial fibrillation.  Duration: Lifelong.  INR: Above target.  Agree with Dr. Groce's assessment and plan. 

## 2013-03-15 NOTE — Progress Notes (Signed)
Anti-Coagulation Progress Note  [name]. is a 67 y.o. male who is currently on an anti-coagulation regimen.    RECENT RESULTS: Recent results are below, the most recent result is correlated with a dose of 57.5 mg. per week: Lab Results  Component Value Date   INR 3.50 03/15/2013   INR 4.40 03/08/2013   INR 1.50 02/22/2013    ANTI-COAG DOSE: Anticoagulation Dose Instructions as of 03/15/2013     [name] Tue Wed Thu Fri Sat   New Dose 7.5 mg 7.5 mg 7.5 mg 7.5 mg 7.5 mg 7.5 mg 7.5 mg       ANTICOAG SUMMARY: Anticoagulation Episode Summary   Current INR goal   Next INR check 04/05/2013  INR from last check 3.50 (03/15/2013)  Weekly max dose   Target end date   INR check location   Preferred lab   Send INR reminders to ANTICOAG [facility]   Indications  Long-term (current) use of anticoagulants [V58.61] Atrila fibrillation (Resolved) [427.31] Thrombosis 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]        Comments       Anticoagulation Care Providers   Provider Role Specialty Phone number   [name], MD  Internal Medicine [phone]      ANTICOAG TODAY: Anticoagulation Summary as of 03/15/2013   INR goal   Selected INR 3.50 (03/15/2013)  Next INR check 04/05/2013  Target end date    Indications  Long-term (current) use of anticoagulants [V58.61] Atrila fibrillation (Resolved) [427.31] Thrombosis 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]      Anticoagulation Episode Summary   INR check location    Preferred lab    Send INR reminders to ANTICOAG IMP   Comments     Anticoagulation Care Providers   Provider Role Specialty Phone number   [name], MD  Internal Medicine [phone]      PATIENT INSTRUCTIONS: Patient Instructions  Patient instructed to take medications as defined in the Anti-coagulation Track section of this encounter.  Patient instructed to take today's dose.  Patient verbalized understanding of these instructions.       FOLLOW-UP Return in 3 weeks (on  04/05/2013) for Follow up INR at 3:30PM.  [name], III Pharm.D., CACP

## 2013-03-18 ENCOUNTER — Other Ambulatory Visit: Payer: Self-pay | Admitting: Internal Medicine

## 2013-03-18 MED ORDER — OXYCODONE-ACETAMINOPHEN 10-325 MG PO TABS: 1.0000 | ORAL_TABLET | Freq: Four times a day (QID) | ORAL | Status: DC | PRN

## 2013-03-18 NOTE — Telephone Encounter (Signed)
Last refilled 10/3 # 120

## 2013-03-18 NOTE — Telephone Encounter (Addendum)
I printed and signed 3 prescriptions for refill of oxycodone-acetaminophen 10-325 mg one every 6 hours as needed for pain, #120 as follows:  A prescription to be provided to patient and filled no earlier than 03/20/2013. A prescription to be filled no earlier than 04/19/2013, to be held on file in the clinic and provided to patient in one month if still appropriate. A prescription to be filled no earlier than 05/19/2013, to be held on file in the clinic and provided to patient in two months if still appropriate.  Prescriptions were given to nurse.

## 2013-03-18 NOTE — Telephone Encounter (Signed)
Pt informed Rx are ready 

## 2013-03-25 ENCOUNTER — Other Ambulatory Visit: Payer: Self-pay

## 2013-03-30 ENCOUNTER — Ambulatory Visit: Payer: Medicare Other | Admitting: Internal Medicine

## 2013-03-31 ENCOUNTER — Other Ambulatory Visit: Payer: Self-pay | Admitting: *Deleted

## 2013-03-31 ENCOUNTER — Ambulatory Visit: Payer: Medicare Other | Admitting: Internal Medicine

## 2013-04-01 ENCOUNTER — Other Ambulatory Visit: Payer: Self-pay | Admitting: Internal Medicine

## 2013-04-01 ENCOUNTER — Encounter: Payer: Self-pay | Admitting: Internal Medicine

## 2013-04-01 MED ORDER — TIOTROPIUM BROMIDE MONOHYDRATE 18 MCG IN CAPS: 18.0000 ug | ORAL_CAPSULE | Freq: Every day | RESPIRATORY_TRACT | Status: DC

## 2013-04-01 NOTE — Telephone Encounter (Signed)
Has Jan appt

## 2013-04-02 NOTE — Telephone Encounter (Signed)
A 7 month supply was ordered 03/18/13.

## 2013-04-05 ENCOUNTER — Ambulatory Visit: Payer: Medicare Other

## 2013-04-05 ENCOUNTER — Ambulatory Visit (INDEPENDENT_AMBULATORY_CARE_PROVIDER_SITE_OTHER): Payer: Medicare Other | Admitting: Pharmacist

## 2013-04-05 DIAGNOSIS — Z7901 Long term (current) use of anticoagulants: Secondary | ICD-10-CM

## 2013-04-05 DIAGNOSIS — I81 Portal vein thrombosis: Secondary | ICD-10-CM

## 2013-04-05 LAB — POCT INR: INR: 6.4

## 2013-04-05 NOTE — Patient Instructions (Signed)
Patient instructed to take medications as defined in the Anti-coagulation Track section of this encounter.  Patient instructed to OMIT today's dose AND TOMORROW's DOSE.  Patient verbalized understanding of these instructions.

## 2013-04-05 NOTE — Progress Notes (Signed)
Anti-Coagulation Progress Note  [name]. is a 67 y.o. male who is currently on an anti-coagulation regimen.    RECENT RESULTS: Recent results are below, the most recent result is correlated with a dose of 52.5 mg. per week: Lab Results  Component Value Date   INR 6.4 04/05/2013   INR 3.50 03/15/2013   INR 4.40 03/08/2013    ANTI-COAG DOSE: Anticoagulation Dose Instructions as of 04/05/2013     [name] Tue Wed Thu Fri Sat   New Dose 7.5 mg 7.5 mg 5 mg 7.5 mg 5 mg 7.5 mg 5 mg       ANTICOAG SUMMARY: Anticoagulation Episode Summary   Current INR goal   Next INR check 04/12/2013  INR from last check 6.4 (04/05/2013)  Weekly max dose   Target end date   INR check location   Preferred lab   Send INR reminders to ANTICOAG [facility]   Indications  Long-term (current) use of anticoagulants [V58.61] Atrila fibrillation (Resolved) [427.31] Thrombosis 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]        Comments       Anticoagulation Care Providers   Provider Role Specialty Phone number   [name], MD  Internal Medicine [phone]      ANTICOAG TODAY: Anticoagulation Summary as of 04/05/2013   INR goal   Selected INR 6.4 (04/05/2013)  Next INR check 04/12/2013  Target end date    Indications  Long-term (current) use of anticoagulants [V58.61] Atrila fibrillation (Resolved) [427.31] Thrombosis 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]      Anticoagulation Episode Summary   INR check location    Preferred lab    Send INR reminders to ANTICOAG IMP   Comments     Anticoagulation Care Providers   Provider Role Specialty Phone number   [name], MD  Internal Medicine [phone]      PATIENT INSTRUCTIONS: Patient Instructions  Patient instructed to take medications as defined in the Anti-coagulation Track section of this encounter.  Patient instructed to OMIT today's dose AND TOMORROW's DOSE.  Patient verbalized understanding of these instructions.       FOLLOW-UP Return in 7 days  (on 04/12/2013) for Follow up INR at 4:30PM.  [name], III Pharm.D., CACP

## 2013-04-12 ENCOUNTER — Ambulatory Visit (INDEPENDENT_AMBULATORY_CARE_PROVIDER_SITE_OTHER): Payer: Medicare Other | Admitting: Pharmacist

## 2013-04-12 DIAGNOSIS — Z7901 Long term (current) use of anticoagulants: Secondary | ICD-10-CM

## 2013-04-12 DIAGNOSIS — I81 Portal vein thrombosis: Secondary | ICD-10-CM

## 2013-04-12 LAB — POCT INR: INR: 2.1

## 2013-04-12 NOTE — Progress Notes (Signed)
Anti-Coagulation Progress Note  [name]. is a 67 y.o. male who is currently on an anti-coagulation regimen.    RECENT RESULTS: Recent results are below, the most recent result is correlated with a dose of 45 mg. per week: Lab Results  Component Value Date   INR 2.1 04/12/2013   INR 6.4 04/05/2013   INR 3.50 03/15/2013    ANTI-COAG DOSE: Anticoagulation Dose Instructions as of 04/12/2013     [name] Tue Wed Thu Fri Sat   New Dose 7.5 mg 7.5 mg 7.5 mg 7.5 mg 7.5 mg 7.5 mg 7.5 mg       ANTICOAG SUMMARY: Anticoagulation Episode Summary   Current INR goal   Next INR check 04/26/2013  INR from last check 2.1 (04/12/2013)  Weekly max dose   Target end date   INR check location   Preferred lab   Send INR reminders to ANTICOAG IMP   Indications  Long-term (current) use of anticoagulants [V58.61] Atrila fibrillation (Resolved) [427.31] Thrombosis 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]        Comments       Anticoagulation Care Providers   Provider Role Specialty Phone number   [name], MD  Internal Medicine [phone]      ANTICOAG TODAY: Anticoagulation Summary as of 04/12/2013   INR goal   Selected INR 2.1 (04/12/2013)  Next INR check 04/26/2013  Target end date    Indications  Long-term (current) use of anticoagulants [V58.61] Atrila fibrillation (Resolved) [427.31] Thrombosis 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]      Anticoagulation Episode Summary   INR check location    Preferred lab    Send INR reminders to ANTICOAG IMP   Comments     Anticoagulation Care Providers   Provider Role Specialty Phone number   [name], MD  Internal Medicine [phone]      PATIENT INSTRUCTIONS: Patient Instructions  Patient instructed to take medications as defined in the Anti-coagulation Track section of this encounter.  Patient instructed to take today's dose.  Patient verbalized understanding of these instructions.       FOLLOW-UP Return in 2 weeks (on 04/26/2013) for  Follow up INR at 4PM.  [name], III Pharm.D., CACP

## 2013-04-12 NOTE — Patient Instructions (Signed)
Patient instructed to take medications as defined in the Anti-coagulation Track section of this encounter.  Patient instructed to take today's dose.  Patient verbalized understanding of these instructions.    

## 2013-04-26 ENCOUNTER — Ambulatory Visit: Payer: Medicare Other

## 2013-04-28 ENCOUNTER — Encounter: Payer: Self-pay | Admitting: *Deleted

## 2013-05-10 ENCOUNTER — Telehealth: Payer: Self-pay | Admitting: *Deleted

## 2013-05-10 NOTE — Telephone Encounter (Signed)
Pt called since 05/08/13 - was out shopping - felt dizzy,nausea, fast heart rate and SOB. Suggest to go to ER - no appt left in clinic this week. Stanton Kidney Vani Gunner RN 05/10/13 12:12PM

## 2013-05-16 ENCOUNTER — Other Ambulatory Visit: Payer: Self-pay | Admitting: Internal Medicine

## 2013-05-17 ENCOUNTER — Encounter (HOSPITAL_COMMUNITY): Payer: Self-pay | Admitting: Emergency Medicine

## 2013-05-17 ENCOUNTER — Observation Stay (HOSPITAL_COMMUNITY)
Admission: EM | Admit: 2013-05-17 | Discharge: 2013-05-19 | Disposition: A | Discharge status: Home / Self Care | Payer: Medicare Other | Attending: Internal Medicine | Admitting: Internal Medicine

## 2013-05-17 ENCOUNTER — Ambulatory Visit (INDEPENDENT_AMBULATORY_CARE_PROVIDER_SITE_OTHER): Payer: Medicare Other | Admitting: Internal Medicine

## 2013-05-17 ENCOUNTER — Inpatient Hospital Stay (HOSPITAL_COMMUNITY): Payer: Medicare Other

## 2013-05-17 ENCOUNTER — Other Ambulatory Visit: Payer: Self-pay

## 2013-05-17 ENCOUNTER — Emergency Department (HOSPITAL_COMMUNITY): Payer: Medicare Other

## 2013-05-17 ENCOUNTER — Encounter: Payer: Self-pay | Admitting: Internal Medicine

## 2013-05-17 VITALS — BP 148/98 | HR 71 | Temp 96.3°F | Ht 75.0 in | Wt 144.3 lb

## 2013-05-17 DIAGNOSIS — Z87891 Personal history of nicotine dependence: Secondary | ICD-10-CM | POA: Diagnosis present

## 2013-05-17 DIAGNOSIS — I10 Essential (primary) hypertension: Secondary | ICD-10-CM

## 2013-05-17 DIAGNOSIS — R911 Solitary pulmonary nodule: Secondary | ICD-10-CM | POA: Insufficient documentation

## 2013-05-17 DIAGNOSIS — I4891 Unspecified atrial fibrillation: Secondary | ICD-10-CM

## 2013-05-17 DIAGNOSIS — R0602 Shortness of breath: Secondary | ICD-10-CM

## 2013-05-17 DIAGNOSIS — Z91199 Patient's noncompliance with other medical treatment and regimen due to unspecified reason: Secondary | ICD-10-CM | POA: Insufficient documentation

## 2013-05-17 DIAGNOSIS — I129 Hypertensive chronic kidney disease with stage 1 through stage 4 chronic kidney disease, or unspecified chronic kidney disease: Secondary | ICD-10-CM | POA: Insufficient documentation

## 2013-05-17 DIAGNOSIS — F17200 Nicotine dependence, unspecified, uncomplicated: Secondary | ICD-10-CM | POA: Insufficient documentation

## 2013-05-17 DIAGNOSIS — I5022 Chronic systolic (congestive) heart failure: Secondary | ICD-10-CM

## 2013-05-17 DIAGNOSIS — R9431 Abnormal electrocardiogram [ECG] [EKG]: Secondary | ICD-10-CM

## 2013-05-17 DIAGNOSIS — I428 Other cardiomyopathies: Secondary | ICD-10-CM | POA: Insufficient documentation

## 2013-05-17 DIAGNOSIS — R001 Bradycardia, unspecified: Secondary | ICD-10-CM

## 2013-05-17 DIAGNOSIS — Z7901 Long term (current) use of anticoagulants: Secondary | ICD-10-CM

## 2013-05-17 DIAGNOSIS — N189 Chronic kidney disease, unspecified: Secondary | ICD-10-CM

## 2013-05-17 DIAGNOSIS — I5042 Chronic combined systolic (congestive) and diastolic (congestive) heart failure: Secondary | ICD-10-CM | POA: Diagnosis present

## 2013-05-17 DIAGNOSIS — I509 Heart failure, unspecified: Secondary | ICD-10-CM | POA: Insufficient documentation

## 2013-05-17 DIAGNOSIS — Z950 Presence of cardiac pacemaker: Secondary | ICD-10-CM | POA: Insufficient documentation

## 2013-05-17 DIAGNOSIS — Z681 Body mass index (BMI) 19 or less, adult: Secondary | ICD-10-CM | POA: Insufficient documentation

## 2013-05-17 DIAGNOSIS — D649 Anemia, unspecified: Secondary | ICD-10-CM | POA: Insufficient documentation

## 2013-05-17 DIAGNOSIS — I480 Paroxysmal atrial fibrillation: Secondary | ICD-10-CM | POA: Diagnosis present

## 2013-05-17 DIAGNOSIS — N183 Chronic kidney disease, stage 3 unspecified: Secondary | ICD-10-CM | POA: Insufficient documentation

## 2013-05-17 DIAGNOSIS — F319 Bipolar disorder, unspecified: Secondary | ICD-10-CM | POA: Insufficient documentation

## 2013-05-17 DIAGNOSIS — G5790 Unspecified mononeuropathy of unspecified lower limb: Secondary | ICD-10-CM | POA: Insufficient documentation

## 2013-05-17 DIAGNOSIS — M1990 Unspecified osteoarthritis, unspecified site: Secondary | ICD-10-CM | POA: Insufficient documentation

## 2013-05-17 DIAGNOSIS — I200 Unstable angina: Secondary | ICD-10-CM | POA: Insufficient documentation

## 2013-05-17 DIAGNOSIS — R634 Abnormal weight loss: Secondary | ICD-10-CM | POA: Insufficient documentation

## 2013-05-17 DIAGNOSIS — R42 Dizziness and giddiness: Secondary | ICD-10-CM | POA: Diagnosis present

## 2013-05-17 DIAGNOSIS — Z96649 Presence of unspecified artificial hip joint: Secondary | ICD-10-CM | POA: Insufficient documentation

## 2013-05-17 DIAGNOSIS — Z86718 Personal history of other venous thrombosis and embolism: Secondary | ICD-10-CM | POA: Insufficient documentation

## 2013-05-17 DIAGNOSIS — F1210 Cannabis abuse, uncomplicated: Secondary | ICD-10-CM | POA: Insufficient documentation

## 2013-05-17 DIAGNOSIS — Z9119 Patient's noncompliance with other medical treatment and regimen: Secondary | ICD-10-CM | POA: Insufficient documentation

## 2013-05-17 DIAGNOSIS — E43 Unspecified severe protein-calorie malnutrition: Secondary | ICD-10-CM

## 2013-05-17 DIAGNOSIS — R079 Chest pain, unspecified: Secondary | ICD-10-CM

## 2013-05-17 DIAGNOSIS — I495 Sick sinus syndrome: Secondary | ICD-10-CM

## 2013-05-17 DIAGNOSIS — E41 Nutritional marasmus: Secondary | ICD-10-CM | POA: Insufficient documentation

## 2013-05-17 DIAGNOSIS — J441 Chronic obstructive pulmonary disease with (acute) exacerbation: Principal | ICD-10-CM

## 2013-05-17 HISTORY — DX: Acute embolism and thrombosis of unspecified vein: I82.90

## 2013-05-17 HISTORY — DX: Thrombocytopenia, unspecified: D69.6

## 2013-05-17 HISTORY — DX: Patient's noncompliance with other medical treatment and regimen due to unspecified reason: Z91.199

## 2013-05-17 HISTORY — DX: Patient's noncompliance with other medical treatment and regimen: Z91.19

## 2013-05-17 LAB — BASIC METABOLIC PANEL
BUN: 18 mg/dL (ref 6–23)
CO2: 25 mEq/L (ref 19–32)
Chloride: 107 mEq/L (ref 96–112)
Creatinine, Ser: 1.74 mg/dL — ABNORMAL HIGH (ref 0.50–1.35)
Glucose, Bld: 71 mg/dL (ref 70–99)

## 2013-05-17 LAB — CBC
HCT: 46.5 % (ref 39.0–52.0)
Hemoglobin: 15.3 g/dL (ref 13.0–17.0)
MCH: 29.7 pg (ref 26.0–34.0)
MCV: 90.1 fL (ref 78.0–100.0)
RBC: 5.16 MIL/uL (ref 4.22–5.81)
WBC: 7.5 10*3/uL (ref 4.0–10.5)

## 2013-05-17 LAB — PROTIME-INR: INR: 1.07 (ref 0.00–1.49)

## 2013-05-17 LAB — POCT I-STAT TROPONIN I: Troponin i, poc: 0.02 ng/mL (ref 0.00–0.08)

## 2013-05-17 LAB — PRO B NATRIURETIC PEPTIDE: Pro B Natriuretic peptide (BNP): 6363 pg/mL — ABNORMAL HIGH (ref 0–125)

## 2013-05-17 LAB — TROPONIN I: Troponin I: <0.3 ng/mL (ref <0.30)

## 2013-05-17 MED ORDER — IPRATROPIUM BROMIDE 0.02 % IN SOLN
0.5000 mg | RESPIRATORY_TRACT | Status: DC
  Administered 2013-05-17: 0.5 mg via RESPIRATORY_TRACT
  Filled 2013-05-17: qty 2.5

## 2013-05-17 MED ORDER — HEPARIN SODIUM (PORCINE) 5000 UNIT/ML IJ SOLN
5000.0000 [IU] | Freq: Three times a day (TID) | INTRAMUSCULAR | Status: DC
  Filled 2013-05-17 (×2): qty 1

## 2013-05-17 MED ORDER — ALBUTEROL SULFATE (2.5 MG/3ML) 0.083% IN NEBU
2.5000 mg | INHALATION_SOLUTION | Freq: Three times a day (TID) | RESPIRATORY_TRACT | Status: DC
  Administered 2013-05-18: 07:00:00 2.5 mg via RESPIRATORY_TRACT
  Filled 2013-05-17 (×4): qty 3

## 2013-05-17 MED ORDER — WARFARIN - PHARMACIST DOSING INPATIENT: Freq: Every day | Status: DC

## 2013-05-17 MED ORDER — OXYCODONE-ACETAMINOPHEN 5-325 MG PO TABS
1.0000 | ORAL_TABLET | Freq: Four times a day (QID) | ORAL | Status: DC | PRN
  Administered 2013-05-18: 1 via ORAL
  Filled 2013-05-17: qty 1

## 2013-05-17 MED ORDER — ACETAMINOPHEN 325 MG PO TABS: 650.0000 mg | ORAL_TABLET | Freq: Once | ORAL | Status: DC

## 2013-05-17 MED ORDER — PREDNISONE 50 MG PO TABS
60.0000 mg | ORAL_TABLET | Freq: Every day | ORAL | Status: DC
  Administered 2013-05-17 – 2013-05-19 (×3): 60 mg via ORAL
  Filled 2013-05-17 (×4): qty 1

## 2013-05-17 MED ORDER — FENTANYL CITRATE 0.05 MG/ML IJ SOLN
50.0000 ug | Freq: Once | INTRAMUSCULAR | Status: AC
  Administered 2013-05-17: 50 ug via INTRAVENOUS
  Filled 2013-05-17: qty 2

## 2013-05-17 MED ORDER — TIOTROPIUM BROMIDE MONOHYDRATE 18 MCG IN CAPS: 18.0000 ug | ORAL_CAPSULE | Freq: Every day | RESPIRATORY_TRACT | Status: DC

## 2013-05-17 MED ORDER — OXYCODONE HCL 5 MG PO TABS: 5.0000 mg | ORAL_TABLET | Freq: Four times a day (QID) | ORAL | Status: DC | PRN

## 2013-05-17 MED ORDER — ASPIRIN EC 325 MG PO TBEC
325.0000 mg | DELAYED_RELEASE_TABLET | Freq: Once | ORAL | Status: AC
  Administered 2013-05-17: 325 mg via ORAL
  Filled 2013-05-17: qty 1

## 2013-05-17 MED ORDER — TECHNETIUM TC 99M DIETHYLENETRIAME-PENTAACETIC ACID: 40.0000 | Freq: Once | INTRAVENOUS | Status: DC | PRN

## 2013-05-17 MED ORDER — HEPARIN BOLUS VIA INFUSION
3500.0000 [IU] | Freq: Once | INTRAVENOUS | Status: AC
  Administered 2013-05-17: 21:00:00 3500 [IU] via INTRAVENOUS
  Filled 2013-05-17: qty 3500

## 2013-05-17 MED ORDER — METOPROLOL SUCCINATE ER 50 MG PO TB24
50.0000 mg | ORAL_TABLET | Freq: Every day | ORAL | Status: DC
  Administered 2013-05-18: 50 mg via ORAL
  Filled 2013-05-17: qty 1

## 2013-05-17 MED ORDER — ALBUTEROL SULFATE (5 MG/ML) 0.5% IN NEBU
5.0000 mg | INHALATION_SOLUTION | Freq: Once | RESPIRATORY_TRACT | Status: DC
  Filled 2013-05-17: qty 6

## 2013-05-17 MED ORDER — AZITHROMYCIN 250 MG PO TABS
250.0000 mg | ORAL_TABLET | Freq: Every day | ORAL | Status: DC
Start: 2013-05-18 — End: 2013-05-19
  Administered 2013-05-18 – 2013-05-19 (×2): 250 mg via ORAL
  Filled 2013-05-17 (×2): qty 1

## 2013-05-17 MED ORDER — METHYLPREDNISOLONE SODIUM SUCC 125 MG IJ SOLR
80.0000 mg | Freq: Three times a day (TID) | INTRAMUSCULAR | Status: AC
  Administered 2013-05-17 – 2013-05-18 (×2): 80 mg via INTRAVENOUS
  Filled 2013-05-17 (×2): qty 1.28

## 2013-05-17 MED ORDER — SODIUM CHLORIDE 0.9 % IJ SOLN
3.0000 mL | Freq: Two times a day (BID) | INTRAMUSCULAR | Status: DC
  Administered 2013-05-17 – 2013-05-18 (×2): 3 mL via INTRAVENOUS

## 2013-05-17 MED ORDER — DRONEDARONE HCL 400 MG PO TABS
400.0000 mg | ORAL_TABLET | Freq: Two times a day (BID) | ORAL | Status: DC
  Administered 2013-05-17 – 2013-05-19 (×4): 400 mg via ORAL
  Filled 2013-05-17 (×6): qty 1

## 2013-05-17 MED ORDER — AZITHROMYCIN 500 MG PO TABS
500.0000 mg | ORAL_TABLET | Freq: Every day | ORAL | Status: AC
  Administered 2013-05-17: 500 mg via ORAL
  Filled 2013-05-17: qty 1

## 2013-05-17 MED ORDER — HEPARIN (PORCINE) IN NACL 100-0.45 UNIT/ML-% IJ SOLN
950.0000 [IU]/h | INTRAMUSCULAR | Status: DC
  Administered 2013-05-17: 950 [IU]/h via INTRAVENOUS
  Filled 2013-05-17: qty 250

## 2013-05-17 MED ORDER — ALBUTEROL SULFATE (2.5 MG/3ML) 0.083% IN NEBU
2.5000 mg | INHALATION_SOLUTION | RESPIRATORY_TRACT | Status: DC
  Administered 2013-05-17: 2.5 mg via RESPIRATORY_TRACT
  Filled 2013-05-17: qty 3

## 2013-05-17 MED ORDER — BUPROPION HCL 75 MG PO TABS
75.0000 mg | ORAL_TABLET | Freq: Two times a day (BID) | ORAL | Status: DC
  Administered 2013-05-17 – 2013-05-19 (×4): 75 mg via ORAL
  Filled 2013-05-17 (×5): qty 1

## 2013-05-17 MED ORDER — IPRATROPIUM BROMIDE 0.02 % IN SOLN
0.5000 mg | Freq: Three times a day (TID) | RESPIRATORY_TRACT | Status: DC
  Administered 2013-05-18: 07:00:00 0.5 mg via RESPIRATORY_TRACT
  Filled 2013-05-17: qty 2.5

## 2013-05-17 MED ORDER — TECHNETIUM TO 99M ALBUMIN AGGREGATED
6.0000 | Freq: Once | INTRAVENOUS | Status: AC | PRN
  Administered 2013-05-17: 6 via INTRAVENOUS

## 2013-05-17 MED ORDER — PANTOPRAZOLE SODIUM 40 MG PO TBEC
40.0000 mg | DELAYED_RELEASE_TABLET | Freq: Every day | ORAL | Status: DC
  Administered 2013-05-18 – 2013-05-19 (×2): 40 mg via ORAL
  Filled 2013-05-17 (×2): qty 1

## 2013-05-17 MED ORDER — SODIUM CHLORIDE 0.9 % IJ SOLN
3.0000 mL | Freq: Two times a day (BID) | INTRAMUSCULAR | Status: DC
  Administered 2013-05-19: 10:00:00 3 mL via INTRAVENOUS

## 2013-05-17 MED ORDER — LISINOPRIL 5 MG PO TABS
5.0000 mg | ORAL_TABLET | Freq: Every day | ORAL | Status: DC
  Administered 2013-05-18 – 2013-05-19 (×2): 5 mg via ORAL
  Filled 2013-05-17 (×2): qty 1

## 2013-05-17 MED ORDER — OXYCODONE-ACETAMINOPHEN 10-325 MG PO TABS: 1.0000 | ORAL_TABLET | Freq: Four times a day (QID) | ORAL | Status: DC | PRN

## 2013-05-17 MED ORDER — ACETAMINOPHEN 325 MG PO TABS: 650.0000 mg | ORAL_TABLET | Freq: Four times a day (QID) | ORAL | Status: DC | PRN

## 2013-05-17 MED ORDER — WARFARIN SODIUM 10 MG PO TABS: 10.0000 mg | ORAL_TABLET | Freq: Once | ORAL | Status: DC

## 2013-05-17 MED ORDER — ACETAMINOPHEN 650 MG RE SUPP: 650.0000 mg | Freq: Four times a day (QID) | RECTAL | Status: DC | PRN

## 2013-05-17 MED ORDER — GUAIFENESIN ER 600 MG PO TB12
600.0000 mg | ORAL_TABLET | Freq: Two times a day (BID) | ORAL | Status: DC
  Administered 2013-05-17 – 2013-05-19 (×4): 600 mg via ORAL
  Filled 2013-05-17 (×5): qty 1

## 2013-05-17 NOTE — Assessment & Plan Note (Signed)
subther INR ~1 today

## 2013-05-17 NOTE — ED Provider Notes (Signed)
CSN: [id number]     Arrival date & time 05/17/13  1155 History   First MD Initiated Contact with Patient 05/17/13 1359     Chief Complaint  Patient presents with  . Shortness of Breath  . Chest Pain  . Cough   (Consider location/radiation/quality/duration/timing/severity/associated sxs/prior Treatment) HPI Pt with history of numerous medical problems as below presents to the ED from [facility] office where he was seen for 9 days of cough, SOB and moderate aching chest pains. He has not had fever, but reports cough is productive of white sputum and associated with wheezing. He has remote history of DVT and is non-compliant with coumadin. Sent from [facility] office to rule out PE. Continues to smoke.   Past Medical History  Diagnosis Date  . Hypertension   . Renal insufficiency     Renal U/S 12/04/2009 showed no pathological findings. Labs 12/04/2009 include normal ESR, C3, C4; neg ANA; SPEP showed nonspecific increase in the alpha-2 region with no M-spike; UPEP showed no monoclonal free light chains; urine IFE showed polyclonal increase in feree Kappa and/or free Lambda light chains.  . Chronic systolic heart failure   . Depression   . GERD (gastroesophageal reflux disease)   . Portal vein thrombosis   . Avascular necrosis     right hip s/p replacement  . Gastritis   . Alcohol abuse   . Erectile dysfunction   . Bipolar disorder   . Hydrocele, unspecified   . Intermittent claudication   . Edema   . Lung nodule     Chest CT scan on 12/14/2008 showed a nodular opacity at the left lung base felt to most likely represent scarring.  Follow-up chest CT scan on 06/02/2010 showed parenchymal scarring in the left apex, left  lower lobe, and lingula; some of this scarring at the left base had a nodular appearance, unchanged.  . 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  Thyroid scan 08/28/2009 was normal with no focal areas of abnormal increased or decreased activity  seen; the uptake of I 131 sodium iodide at 24 hours was 5.7%.  TSH and free T4 normalized by 08/16/2009.  [name] Pneumonia   . Intestinal obstruction   . Chronic systolic heart failure   . Other primary cardiomyopathies     EF 25% by echo 7/14  . COPD (chronic obstructive pulmonary disease)   . Clostridium difficile colitis   . E coli bacteremia   . Cluster headaches   . DVT (deep venous thrombosis)     He has hypercoagulability with multiple occluded venous vessels  . Pleural plaque     Chest CT on 06/02/2010 showed stable extensive calcified pleural plaques involving the left hemithorax, consistent with asbestos related pleural disease.  . Weight loss     Normal colonoscopy by Dr. [name] on 11/06/2010.  . 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 Atrial fibrillation     Paroxysmal, failed medical therapy with amiodarone but has done very well with multaq  . Anginal pain     "every once in awhile now" (03/17/2012)  . Pacemaker   . Shortness of breath     "all the time" (03/17/2012)  . Osteoarthritis   . Spondylosis   . Chronic lower back pain     "cause of my hip" (03/17/2012)   Past Surgical History  Procedure Laterality Date  . Left hip bipolar hemiarthroplasty  09/09/2002    By Dr. Kristeen Miss  .  Insert / replace / remove pacemaker  02/2003    By Dr. Charolette Child, gen change by Fawn Kirk  . Total hip arthroplasty  03/08/2008    right; By Dr. Charlesetta Shanks   Family History  Problem Relation Age of Onset  . Hypertension Mother   . Cancer Brother     Unsure of type.  . Asthma Mother   . Coronary artery disease Mother   . Colon cancer Neg Hx   . Lung cancer Neg Hx   . Prostate cancer Neg Hx    History  Substance Use Topics  . Smoking status: Current Every Day Smoker -- 0.50 packs/day for 50 years    Types: Cigarettes  . Smokeless tobacco: Never Used     Comment: encouraged to quit today, he states "I have been trying to quit for 3 years"  . Alcohol Use:  No     Comment: 03/17/2012 "don't drink alcohol now; used to; never had problems w/it"    Review of Systems All other systems reviewed and are negative except as noted in HPI.   Allergies  Penicillins  Home Medications   Current Outpatient Rx  Name  Route  Sig  Dispense  Refill  . albuterol (PROVENTIL HFA;VENTOLIN HFA) 108 (90 BASE) MCG/ACT inhaler   Inhalation   Inhale 2 puffs into the lungs every 6 (six) hours as needed for wheezing or shortness of breath.         [name] buPROPion (WELLBUTRIN) 75 MG tablet   Oral   Take 1 tablet (75 mg total) by mouth 2 (two) times daily.   60 tablet   5   . lisinopril (PRINIVIL,ZESTRIL) 5 MG tablet   Oral   Take 1 tablet (5 mg total) by mouth daily.   90 tablet   3   . metoprolol succinate (TOPROL-XL) 50 MG 24 hr tablet   Oral   Take 1 tablet (50 mg total) by mouth daily. Take with or immediately following a meal.   90 tablet   3   . MULTAQ 400 MG tablet      TAKE 1 TABLET BY MOUTH TWICE A DAY WITH A MEAL   180 tablet   1   . omeprazole (PRILOSEC) 20 MG capsule      TAKE 2 CAPSULES (40 MG TOTAL) BY MOUTH DAILY.   60 capsule   6   . oxyCODONE-acetaminophen (PERCOCET) 10-325 MG per tablet   Oral   Take 1 tablet by mouth every 6 (six) hours as needed for pain.   120 tablet   0     To be filled no earlier than 05/19/2013.   . tiotropium (SPIRIVA) 18 MCG inhalation capsule   Inhalation   Place 1 capsule (18 mcg total) into inhaler and inhale daily.   30 capsule   11   . warfarin (COUMADIN) 5 MG tablet   Oral   Take 7.5-10 mg by mouth daily. Take two tablets by mouth daily EXCEPT on Wednesdays and Fridays--take only 1&1/2 tablets.          BP 157/85  Pulse 60  Temp(Src) 97.6 F (36.4 C) (Oral)  Resp 24  Ht 6\' 3"  (1.905 m)  Wt 144 lb (65.318 kg)  BMI 18.00 kg/m2  SpO2 100% Physical Exam  Nursing note and vitals reviewed. Constitutional: He is oriented to person, place, and time. He appears well-developed and  well-nourished.  HENT:  Head: Normocephalic and atraumatic.  Eyes: EOM are normal. Pupils are equal, round, and  reactive to light.  Neck: Normal range of motion. Neck supple.  Cardiovascular: Normal rate, normal heart sounds and intact distal pulses.   Pulmonary/Chest: Effort normal. No respiratory distress. He has wheezes (faint end expiratory).  Abdominal: Bowel sounds are normal. He exhibits no distension. There is no tenderness.  Musculoskeletal: Normal range of motion. He exhibits no edema and no tenderness.  Neurological: He is alert and oriented to person, place, and time. He has normal strength. No cranial nerve deficit or sensory deficit.  Skin: Skin is warm and dry. No rash noted.  Psychiatric: He has a normal mood and affect.    ED Course  Procedures (including critical care time) Labs Review Labs Reviewed  CBC - Abnormal; Notable for the following:    Platelets 133 (*)    All other components within normal limits  BASIC METABOLIC PANEL - Abnormal; Notable for the following:    Creatinine, Ser 1.74 (*)    GFR calc non Af Amer 38 (*)    GFR calc Af Amer 44 (*)    All other components within normal limits  PRO B NATRIURETIC PEPTIDE - Abnormal; Notable for the following:    Pro B Natriuretic peptide (BNP) 6363.0 (*)    All other components within normal limits  PROTIME-INR  POCT I-STAT TROPONIN I   Imaging Review Dg Chest 2 View  05/17/2013   CLINICAL DATA:  Dizziness and shortness of breath.  EXAM: CHEST  2 VIEW  COMPARISON:  03/17/2012  FINDINGS: Dual lead pacemaker remains in place. The right lung again shows chronic hyperinflation. Nipple shadow present on the right. The left lung shows chronic pleural and parenchymal scarring, not significantly changed. No evidence of active pneumonia, heart failure, effusion or collapse.  IMPRESSION: Chronic lung disease with hyperinflation on the right and extensive pleural parenchymal scarring on the left. No change compared  previous studies.   Electronically Signed   By: Paulina Fusi M.D.   On: 05/17/2013 13:03    EKG Interpretation    Date/Time:  Monday May 17 2013 11:58:59 EST Ventricular Rate:  67 PR Interval:  146 QRS Duration: 78 QT Interval:  410 QTC Calculation: 433 R Axis:   6 Text Interpretation:  Sinus rhythm with Premature atrial complexes ST \\T \ T wave abnormality, consider inferior ischemia ST \\T \ T wave abnormality, consider anterolateral ischemia Abnormal ECG Since last tracing deep T wave inversions new Confirmed by SHELDON  MD, CHARLES ([id number]) on 05/17/2013 2:48:46 PM            MDM   1. SOB (shortness of breath)   2. Abnormal EKG     Pt with renal insufficiency, cannot get CTA, will send for V/Q. Also has new T wave inversions, will admit for further evaluation.    Charles B. Bernette Mayers, MD 05/17/13 [phone]

## 2013-05-17 NOTE — Progress Notes (Signed)
Patient ambulated in hallway with RN. Patient remained 100% on RA throughout ambulation. RN will continue to monitor. Louretta Parma, RN

## 2013-05-17 NOTE — Consult Note (Signed)
Cardiology Consultation Note  Patient ID: Travis Mogel., MRN: [id number], DOB/AGE: [id number] 69 y.o. Admit date: 05/17/2013   Date of Consult: 05/17/2013 Primary Physician: [name], MD Primary Cardiologist: Dr. Johney Frame  Chief Complaint: SOB, cough Reason for Consult: SOB, abnormal EKG  HPI: [name] is a 67 y/o M with history of NICM EF 25-30%, PAF/flutter with tachybrady syndrome s/p pacemaker (no ICD - on Multaq under close care of Dr. 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 He also reports 1 month of productive cough of white sputum, progressive dyspnea and lightheadedness when standing up and with exertion. A week ago he had an episode of SOB, dizziness, "hyperventilating" and chest pressure that lasted about 30 minutes and resolved while sitting in his car. He reports a dull aching chest pain once every 2 weeks occurring with moving around lasting about 10 minutes - however, this does not happen every time he moves around. He endorses orthopnea in the mornings but not necessarily at night. Denies LEE, chest pain within the last week, fever, chills, sick contacts. He used to weigh 220 two yrs ago and is now 141. Denies any bowel bleeding. He says he's been out of Coumadin for 2 days but was otherwise taking home meds. INR was 1.07 and due to concern for PE he was admitted to [facility] for further evaluation. VQ scan low prob for PE. Labs significant for pBNP 6300, initial troponin neg, Cr stable at 1.74, CBC ok except chronic mild thrombocytopenia. CXR with chronic lung disease, unchanged. Denies current complaint while resting in bed. Orthostatics negative on admission (125/78 lying -> 147/106 standing, pulse 64->69), not hypoxic.  Past Medical History  Diagnosis Date  . Hypertension   . CKD  (chronic kidney disease)     Renal U/S 12/04/2009 showed no pathological findings. Labs 12/04/2009 include normal ESR, C3, C4; neg ANA; SPEP showed nonspecific increase in the alpha-2 region with no M-spike; UPEP showed no monoclonal free light chains; urine IFE showed polyclonal increase in feree Kappa and/or free Lambda light chains. Baseline Cr reported 1.7-2.5.  [name] Chronic systolic heart failure     a. NICM - EF 35% with normal cors 2003. b. Last echo 11/2012 - EF 25-30%.  . Depression   . GERD (gastroesophageal reflux disease)   . Portal vein thrombosis   . Avascular necrosis     right hip s/p replacement  . Gastritis   . Alcohol abuse   . Erectile dysfunction   . Bipolar disorder   . Hydrocele, unspecified   . Intermittent claudication   . Lung nodule     Chest CT scan on 12/14/2008 showed a nodular opacity at the left lung base felt to most likely represent scarring.  Follow-up chest CT scan on 06/02/2010 showed parenchymal scarring in the left apex, left  lower lobe, and lingula; some of this scarring at the left base had a nodular appearance, unchanged. Chest 09/2012 - stable.  . 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  Thyroid scan 08/28/2009 was normal with no focal areas of abnormal increased or decreased activity seen; the uptake of I 131 sodium iodide at 24 hours was 5.7%.  TSH and free T4 normalized by 08/16/2009.  [name] Pneumonia   . Intestinal obstruction   . COPD (chronic obstructive pulmonary disease)   .  Clostridium difficile colitis   . E coli bacteremia   . Cluster headaches   . DVT (deep venous thrombosis)     He has hypercoagulability with multiple prior DVTs  . Pleural plaque     H/o asbestos exposure. Chest CT on 06/02/2010 showed stable extensive calcified pleural plaques involving the left hemithorax, consistent with asbestos related pleural disease.  . Weight loss     Normal colonoscopy by Dr. [name] on 11/06/2010.  .  Tachycardia-bradycardia syndrome     a. 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 b. St. Jude gen change 2010.  [name] PAF (paroxysmal atrial fibrillation)     Paroxysmal, failed medical therapy with amiodarone but has done very well with multaq  . Osteoarthritis   . Spondylosis   . Chronic low back pain     "cause of my hip" (03/17/2012)  . 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  . Noncompliance   . Atrial flutter   . Thrombocytopenia       Most Recent Cardiac Studies: 2D Echo 12/07/12 - Left ventricle: The cavity size was normal. Wall thickness was increased in a pattern of moderate LVH. Systolic function was severely reduced. The estimated ejection fraction was in the range of 25% to 30%. Diffuse hypokinesis. Doppler parameters are consistent with abnormal left ventricular relaxation (grade 1 diastolic dysfunction). - Aortic valve: Mild regurgitation. - Left atrium: The atrium was mildly dilated.  Nuc 06/2012 Impression  Exercise Capacity: Lexiscan with no exercise.  BP Response: Normal blood pressure response.  Clinical Symptoms: Headache  ECG Impression: No significant ST segment change suggestive of ischemia.  Comparison with Prior Nuclear Study: No images to compare  Overall Impression: Normal stress nuclear study.  LV Ejection Fraction: NA LV Wall Motion: NA   Cardiac Cath 10/22003 PROCEDURE PERFORMED: Left heart catheterization, selective coronary  angiography, left ventricular function study.  INDICATION: This 67 year old black male had recurrent chest pain along with  cardiac risk factors of smoking for 35 years and history of drug abuse.  APPROACH: Right femoral artery using 6 French diagnostic catheters.  COMPLICATIONS: None.  A Perclose suture was applied at the end of the procedure successfully.  HEMODYNAMIC DATA: The left ventricular pressure was 170/4/16 and  aortic  pressure was 172/75/112.  CORONARY ANATOMY: Left main coronary artery: The left main coronary artery  was unremarkable.  Left anterior descending coronary artery: The left anterior descending  coronary artery was apparently unremarkable with a gradual distal narrowing.  Diagonal vessels were also unremarkable.  Left circumflex coronary artery: The left circumflex coronary artery was  essentially unremarkable. It obtuse marginal branch was also unremarkable.  Right coronary artery: The right coronary artery was dominant and with  normal posterior descending coronary artery.  LEFT VENTRICULOGRAM: The left ventriculogram showed a generalized  hypokinesia with ejection fraction of 35%. There was also mild mitral  regurgitation.  IMPRESSION:  1. Normal coronaries.  2. Moderate left ventricular systolic dysfunction.  3.  Mild mitral regurgitation.  4. Noncardiac chest pain.  RECOMMENDATIONS: This patient will have noncardiac chest pain evaluation  and medical therapy.    Surgical History:  Past Surgical History  Procedure Laterality Date  . Left hip bipolar hemiarthroplasty  09/09/2002    By Dr. Kristeen Miss  . Insert / replace / remove pacemaker  02/2003    By Dr. Charolette Child, gen change by Fawn Kirk  . Total hip arthroplasty  03/08/2008  right; By Dr. Charlesetta Shanks     Home Meds: Prior to Admission medications   Medication Sig Start Date End Date Taking? Authorizing Provider  albuterol (PROVENTIL HFA;VENTOLIN HFA) 108 (90 BASE) MCG/ACT inhaler Inhale 2 puffs into the lungs every 6 (six) hours as needed for wheezing or shortness of breath. 04/22/12  Yes [name], MD  buPROPion (WELLBUTRIN) 75 MG tablet Take 1 tablet (75 mg total) by mouth 2 (two) times daily. 11/25/12  Yes [name], MD  lisinopril (PRINIVIL,ZESTRIL) 5 MG tablet Take 1 tablet (5 mg total) by mouth daily. 12/28/12  Yes Hillis Range, MD  metoprolol succinate (TOPROL-XL) 50 MG 24 hr tablet Take 1  tablet (50 mg total) by mouth daily. Take with or immediately following a meal. 12/28/12  Yes Hillis Range, MD  MULTAQ 400 MG tablet TAKE 1 TABLET BY MOUTH TWICE A DAY WITH A MEAL 12/16/12  Yes Hillis Range, MD  omeprazole (PRILOSEC) 20 MG capsule TAKE 2 CAPSULES (40 MG TOTAL) BY MOUTH DAILY. 03/18/13  Yes [name], MD  oxyCODONE-acetaminophen (PERCOCET) 10-325 MG per tablet Take 1 tablet by mouth every 6 (six) hours as needed for pain. 03/18/13  Yes [name], MD  tiotropium (SPIRIVA) 18 MCG inhalation capsule Place 1 capsule (18 mcg total) into inhaler and inhale daily. 03/31/13  Yes Burns Spain, MD  warfarin (COUMADIN) 5 MG tablet Take 7.5-10 mg by mouth daily. Take two tablets by mouth daily EXCEPT on Wednesdays and Fridays--take only 1&1/2 tablets. 03/15/13 03/15/14 Yes Rocco Serene, MD    Inpatient Medications:  . albuterol  2.5 mg Nebulization Q4H  . albuterol  5 mg Nebulization Once  . aspirin EC  325 mg Oral Once  . azithromycin  500 mg Oral Daily   Followed by  . [START ON 05/18/2013] azithromycin  250 mg Oral Daily  . buPROPion  75 mg Oral BID  . dronedarone  400 mg Oral BID WC  . guaiFENesin  600 mg Oral BID  . heparin  5,000 Units Subcutaneous Q8H  . ipratropium  0.5 mg Nebulization Q4H  . [START ON 05/18/2013] lisinopril  5 mg Oral Daily  . methylPREDNISolone (SOLU-MEDROL) injection  80 mg Intravenous Q8H  . [START ON 05/18/2013] metoprolol succinate  50 mg Oral Daily  . [START ON 05/18/2013] pantoprazole  40 mg Oral Daily  . predniSONE  60 mg Oral Q breakfast  . sodium chloride  3 mL Intravenous Q12H  . sodium chloride  3 mL Intravenous Q12H  . warfarin  10 mg Oral Once  . [START ON 05/18/2013] Warfarin - Pharmacist Dosing Inpatient   Does not apply q1800      Allergies:  Allergies  Allergen Reactions  . Digoxin And Related     Dig toxicity 2009  . Penicillins Rash    History   Social History  . Marital Status: Widowed    Spouse  Name: N/A    Number of Children: 10  . Years of Education: N/A   Occupational History  . RETIRED    Social History Main Topics  . Smoking status: Current Every Day Smoker -- 0.50 packs/day for 50 years    Types: Cigarettes  . Smokeless tobacco: Never Used  . Alcohol Use: No     Comment: prior etoh abuse  . Drug Use: 1.00 per week    Special: Marijuana     Comment: prior marijuana  . Sexual Activity: Yes   Other Topics Concern  .  Not on file   Social History Narrative   Works as a Media planner part time   Smoker 1 pack last 1.5 days tobacco, smokes marijuana    Denies EtOH x 4 years (on 10/12/12)   12 kids    From [place]   [place] Veteran    12 grade education               Family History  Problem Relation Age of Onset  . Hypertension Mother   . Cancer Brother     Unsure of type.  . Asthma Mother   . Coronary artery disease Mother   . Colon cancer Neg Hx   . Lung cancer Neg Hx   . Prostate cancer Neg Hx      Review of Systems: All other systems reviewed and are otherwise negative except as noted above.  Labs: POC troponin neg  Lab Results  Component Value Date   WBC 7.5 05/17/2013   HGB 15.3 05/17/2013   HCT 46.5 05/17/2013   MCV 90.1 05/17/2013   PLT 133* 05/17/2013    Recent Labs Lab 05/17/13 1225  NA 143  K 4.0  CL 107  CO2 25  BUN 18  CREATININE 1.74*  CALCIUM 9.2  GLUCOSE 71   Lab Results  Component Value Date   CHOL 113 08/14/2011   HDL 45 08/14/2011   LDLCALC 59 08/14/2011   TRIG 43 08/14/2011    Radiology/Studies:  Dg Chest 2 View 05/17/2013   CLINICAL DATA:  Dizziness and shortness of breath.  EXAM: CHEST  2 VIEW  COMPARISON:  03/17/2012  FINDINGS: Dual lead pacemaker remains in place. The right lung again shows chronic hyperinflation. Nipple shadow present on the right. The left lung shows chronic pleural and parenchymal scarring, not significantly changed. No evidence of active pneumonia, heart failure, effusion or collapse.   IMPRESSION: Chronic lung disease with hyperinflation on the right and extensive pleural parenchymal scarring on the left. No change compared previous studies.   Electronically Signed   By: Paulina Fusi M.D.   On: 05/17/2013 13:03   Nm Pulmonary Perf And Vent 05/17/2013   CLINICAL DATA:  History of DVT and COPD, now with shortness of breath for 2 weeks, evaluate for pulmonary embolism  EXAM: NUCLEAR MEDICINE VENTILATION - PERFUSION LUNG SCAN  TECHNIQUE: Ventilation images were obtained in multiple projections using inhaled aerosol technetium 99 M DTPA. Perfusion images were obtained in multiple projections after intravenous injection of Tc-67m MAA.  COMPARISON:  Chest radiograph - 05/17/2013; 08/14/2011; chest CT -03/18/2012; VQ scan - 03/18/2012  RADIOPHARMACEUTICALS:  40 mCi Tc-61m DTPA aerosol and 6 mCi Tc-49m MAA  FINDINGS: Review of chest radiograph performed earlier same day demonstrates grossly unchanged borderline enlarged cardiac silhouette and mediastinal contours. A pacer power pack overlies the peripheral aspect of the right upper/mid lung. The lungs remain hyperexpanded. There is grossly unchanged architectural distortion and volume loss primarily involving the left mid lung. Grossly unchanged elevation of the left hemidiaphragm and left apical asymmetric pleural parenchymal thickening. Sequela of prior talc pleurodesis involving the left lung is not well demonstrated for was seen on remote chest CT. No definite pleural effusion or pneumothorax.  Ventilation: There is clumping of inhaled radiotracer about the bilateral pulmonary hila, right greater than left, similar to prior V/Q scan. There is grossly unchanged hypoventilation of the left mid and lower lungs with mottled undulation of the left upper lung. There is preferential ventilation of the right mid and lower lung. There  is an expected photopenic defect at the location of the right anterior chest wall pacemaker power pack. Ingested radiotracer  is seen within the hypopharynx distal esophagus and stomach.  Perfusion: There is matched hypoperfusion involving in the left mid and lower lung with matched mottled perfusion involving the remainder of the left lung. There is an expected photopenia defect at the location of the patient's known right anterior chest wall power pack. Otherwise, there is relative homogeneous perfusion of the right lung parenchyma.  IMPRESSION: 1. Pulmonary embolism absent (low probability for pulmonary embolism) - grossly unchanged from prior V/Q scan performed 02/2012. 2. Persistent volume loss involving the left mid and lower lung with associated matched decreased ventilation and perfusion.   Electronically Signed   By: Simonne Come M.D.   On: 05/17/2013 16:45   EKG: NSR 67bpm inferior TWI and anterolateral TWI V3-V6 Inferior TW change is new in II, avF, but old in III. Anterolateral changes are old.  Physical Exam: Blood pressure 147/106, pulse 69, temperature 98.3 F (36.8 C), temperature source Oral, resp. rate 18, height 6\' 3"  (1.905 m), weight 141 lb 12.1 oz (64.3 kg), SpO2 99.00%. General: Well developed, well nourished, in no acute distress. Head: Normocephalic, atraumatic, sclera non-icteric, no xanthomas, nares are without discharge.  Neck:  JVD not elevated. Negative carotid bruits. Lungs: Diffuse coarse rhonchorous breath sounds, reduced on R side. No wheezing or rales. Breathing is unlabored. Heart: RRR with S1 S2. No murmurs, rubs, or gallops appreciated. Abdomen: Soft, non-tender, non-distended with normoactive bowel sounds. No hepatomegaly. No rebound/guarding. No obvious abdominal masses. Msk:  Strength and tone appear normal for age. Extremities: No clubbing or cyanosis. No edema.  Distal pedal pulses are 2+ and equal bilaterally. Neuro: Alert and oriented X 3. No facial asymmetry. No focal deficit. Moves all extremities spontaneously. Psych:  Responds to questions appropriately with a normal affect.     Assessment and Plan:   1. SOB, suspect due to underlying pulmonary issue (h/o asbestos exposure, pleural calcifications) 2. Chronic systolic CHF/NICM EF 25-30% (normal cors 2003, normal nuc 06/2012) 3. Noncompliance with Coumadin 4. H/o recurrent DVT and arterial thrombosis 5. CKD stage III, stable 6. PAF/flutter, in NSR on Multaq 7. Ongoing weight loss  Clinical picture more concerning for pulmonary picture. We are concerned about his ongoing weight loss and DOE. pBNP is up but he does not appear significantly volume overloaded. Will hold off on diuresis for now pending further evaluation. Update echo. Would use heparin bridge to Coumadin while inpatient given history of significant thrombotic events in the past. Await troponins. May benefit from pulmonary eval this admission. Will defer further malignancy workup to IM who know patient well. Continue current cardiac meds. Check O2 sat with ambulation to determine if he is desaturating.  Signed, Ronie Spies PA-C 05/17/2013, 7:18 PM   Attending note:  Patient seen and examined. Reviewed extensive records and discussed the case with Ms. Jetta Lout. Patient is now admitted from the internal medicine clinic describing a history of progressive cough, intermittent chest discomfort, no fevers or chills, but some productive sputum. Symptoms have occurred over the last week to week and a half. He was admitted with concern to rule out pulmonary embolus given prior history of both arterial and venous thromboembolic disease, currently subtherapeutic on Coumadin. VQ scan was low probability, baseline chest x-ray is significantly abnormal with history of pleural plaques and prior asbestos exposure, also chronic lung disease. From a cardiac perspective he has a nonischemic cardiomyopathy with LVEF  25-30%, being managed conservatively on medical therapy by Dr. Johney Frame. He has a pacemaker in place with history of PAF and sick sinus syndrome, currently on Multaq  under the direction of Dr. Johney Frame.   Initial troponin I level is negative, proBNP in the 6300 range. He does not appear to be markedly volume overloaded on exam, in fact if anything his weight has been dropping. He reports a 60 pound weight loss over the last year, also worsening fatigue and poor appetite. ECG shows sinus rhythm with PACs and LVH, probable repolarization abnormalities with more prominent ST-T wave changes in the inferolateral leads compared to prior tracings. At this point favor repolarization changes rather than ACS.  Reportedly no clear history of GI malignancy by previous colonoscopy in 2012, although he did have an abnormal abdominal/pelvic CT scan at that time showing rectal wall thickening. Followup chest and abdominal CT in May of this year described no acute findings. May warrant further investigation by primary team.  At this point would recommend cycling cardiac markers, we will initiate heparin mainly given his history of recurring thromboembolic disease since he is subtherapeutic on Coumadin. Plan to interrogate pacer in a.m., also repeat echocardiogram.  [name], M.D., F.A.C.C.

## 2013-05-17 NOTE — Progress Notes (Signed)
Case discussed with Dr. Schooler at time of visit.  We reviewed the resident's history and exam and pertinent patient test results.  I agree with the assessment, diagnosis, and plan of care documented in the resident's note. 

## 2013-05-17 NOTE — Progress Notes (Signed)
Patient has arrived to the unit, patient is stable, alert and oriented x3.  Patient's vital signs are stable, will continue to monitor patient. Lorretta Harp RN

## 2013-05-17 NOTE — Progress Notes (Signed)
ANTICOAGULATION CONSULT NOTE - Initial Consult  Pharmacy Consult for Heparin and Coumadin Indication: chest pain/ACS and pulmonary embolus (possibly)  Allergies  Allergen Reactions  . Digoxin And Related     Dig toxicity 2009  . Penicillins Rash    Patient Measurements: Height: 6\' 3"  (190.5 cm) Weight: 141 lb 12.1 oz (64.3 kg) IBW/kg (Calculated) : 84.5 Heparin Dosing Weight:   Vital Signs: Temp: 98.3 F (36.8 C) (12/29 1736) Temp src: Oral (12/29 1736) BP: 147/106 mmHg (12/29 1755) Pulse Rate: 69 (12/29 1755)  Labs:  Recent Labs  05/17/13 1225  HGB 15.3  HCT 46.5  PLT 133*  LABPROT 13.7  INR 1.07  CREATININE 1.74*    Estimated Creatinine Clearance: 36.4 ml/min (by C-G formula based on Cr of 1.74).   Medical History: Past Medical History  Diagnosis Date  . Hypertension   . CKD (chronic kidney disease)     Renal U/S 12/04/2009 showed no pathological findings. Labs 12/04/2009 include normal ESR, C3, C4; neg ANA; SPEP showed nonspecific increase in the alpha-2 region with no M-spike; UPEP showed no monoclonal free light chains; urine IFE showed polyclonal increase in feree Kappa and/or free Lambda light chains. Baseline Cr reported 1.7-2.5.  [name] Chronic systolic heart failure     a. NICM - EF 35% with normal cors 2003. b. Last echo 11/2012 - EF 25-30%.  . Depression   . GERD (gastroesophageal reflux disease)   . Portal vein thrombosis   . Avascular necrosis     right hip s/p replacement  . Gastritis   . Alcohol abuse   . Erectile dysfunction   . Bipolar disorder   . Hydrocele, unspecified   . Intermittent claudication   . Lung nodule     Chest CT scan on 12/14/2008 showed a nodular opacity at the left lung base felt to most likely represent scarring.  Follow-up chest CT scan on 06/02/2010 showed parenchymal scarring in the left apex, left  lower lobe, and lingula; some of this scarring at the left base had a nodular appearance, unchanged. Chest 09/2012 - stable.   . 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  Thyroid scan 08/28/2009 was normal with no focal areas of abnormal increased or decreased activity seen; the uptake of I 131 sodium iodide at 24 hours was 5.7%.  TSH and free T4 normalized by 08/16/2009.  [name] Pneumonia   . Intestinal obstruction   . COPD (chronic obstructive pulmonary disease)   . Clostridium difficile colitis   . E coli bacteremia   . Cluster headaches   . DVT (deep venous thrombosis)     He has hypercoagulability with multiple prior DVTs  . Pleural plaque     H/o asbestos exposure. Chest CT on 06/02/2010 showed stable extensive calcified pleural plaques involving the left hemithorax, consistent with asbestos related pleural disease.  . Weight loss     Normal colonoscopy by Dr. [name] on 11/06/2010.  . Tachycardia-bradycardia syndrome     a. 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 b. St. Jude gen change 2010.  [name] PAF (paroxysmal atrial fibrillation)     Paroxysmal, failed medical therapy with amiodarone but has done very well with multaq  . Osteoarthritis   . Spondylosis   . Chronic low back pain     "cause of my hip" (03/17/2012)  . 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  . Noncompliance   .  Atrial flutter   . Thrombocytopenia     Medications:  Scheduled:  . albuterol  2.5 mg Nebulization Q4H  . albuterol  5 mg Nebulization Once  . [START ON 05/18/2013] azithromycin  250 mg Oral Daily  . buPROPion  75 mg Oral BID  . dronedarone  400 mg Oral BID WC  . guaiFENesin  600 mg Oral BID  . heparin  3,500 Units Intravenous Once  . ipratropium  0.5 mg Nebulization Q4H  . [START ON 05/18/2013] lisinopril  5 mg Oral Daily  . methylPREDNISolone (SOLU-MEDROL) injection  80 mg Intravenous Q8H  . [START ON 05/18/2013] metoprolol succinate  50 mg Oral Daily  . [START ON 05/18/2013]  pantoprazole  40 mg Oral Daily  . predniSONE  60 mg Oral Q breakfast  . sodium chloride  3 mL Intravenous Q12H  . sodium chloride  3 mL Intravenous Q12H  . warfarin  10 mg Oral Once  . [START ON 05/18/2013] Warfarin - Pharmacist Dosing Inpatient   Does not apply q1800    Assessment: 67 yr old male was seen at his PCP office today with SOB, cough, chest pressure. He was referred to the ED for suspicion of PE. INR was low at 1.07 due to pt running out of Coumadin and missing doses for the last couple of days. He is to resume his coumadin per pharmacy and start on heparin to cover him until his INR is therapeutic.  Home dose of coumadin is 10 mg daily except 7.5 mg on Wednesdays and Fridays. Goal of Therapy:  INR 2-3 Heparin level 0.3-0.7 units/ml Monitor platelets by anticoagulation protocol: Yes   Plan:  Heparin bolus 3500 units and heparin drip at 950 units/hr. Daily AM heparin level and CBC Coumadin 10 mg today. INR daily with AM labs.  Eugene Garnet 05/17/2013,8:34 PM

## 2013-05-17 NOTE — ED Notes (Signed)
Patient has not returned from NM V/Q scan. Will transfer patient from ED to floor when patient arrives to [facility].

## 2013-05-17 NOTE — Assessment & Plan Note (Signed)
Will need PE r/o given palpitations and subtherapeutic INR

## 2013-05-17 NOTE — H&P (Signed)
Date: 05/17/2013               Patient Name:  Travis RISON Sr. MRN: [id number]  DOB: 30-Jul-1945 Age / Sex: 67 y.o., male   PCP: Travis Ly, MD         Medical Service: Internal Medicine Teaching Service         Attending Physician: Dr. Burns Spain, MD    First Contact: Dr. Johna [name] Pager: [phone]  Second Contact: Dr. Virgina [name] Pager: [phone]       After Hours (After 5p/  First Contact Pager: [phone]  weekends / holidays): Second Contact Pager: [phone]   Chief Complaint: shortness of breath, cough, and lightheadedness   History of Present Illness:   [name] is a 67 y.o. man with past medical history of for atrial fibrillation on metoprolol, dronedarone and noncompliance with coumadin therapy with prior compliance issues (last INR 2.1 on 04/12/2013), hypertension, CKD, COPD, tachy-brady syndrome with pacemaker, diastolic CHF with nonischemic cardiomyopathy EF 25-30% who presents with shortness of breath and productive cough of thick phlegm for the past month with cough worse in the past week. Pt reports 1 month ago he began having shortness of breath that is worse with activity.   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 He reports no similar symptoms in the past. Since then he has continued to have lightheadedness from sitting to standing with no symptoms of vertigo. He has also had intermittent left sided CP that can occur with activity and rest. He reports having chronic fatigue, worse in the past week such that he has been unable to work and lying in bed. He reports unintentional 50 lb weight loss in last year.   He reports last COPD exacerbation was about 1 year ago and no recent antibiotics or corticosteroids. He reports having to use his albuterol rescue inhaler 2-3x daily in the past month, whereas previously he was not using it  hardly at all. He reports wheezing. He reports using marijuana last week and smoking 3-4 cigarettes a day.   He reports having DVT and arterial thrombosi in the past and is on chronic AC with coumadin (also for PAF) which he was not been taking since running out of 2 days ago. Today he was seen in the [name] and found to have subtherapeutic INR of 1.1with symptoms concerning for PE. He denies lower extremity swelling. Pt received VQ scan in ED that was negative for pulmonary embolus.     Meds: Current Facility-Administered Medications  Medication Dose Route Frequency Provider Last Rate Last Dose  . acetaminophen (TYLENOL) tablet 650 mg  650 mg Oral Q6H PRN Travis Palmer, MD       Or  . acetaminophen (TYLENOL) suppository 650 mg  650 mg Rectal Q6H PRN Travis Palmer, MD      . albuterol (PROVENTIL) (2.5 MG/3ML) 0.083% nebulizer solution 2.5 mg  2.5 mg Nebulization Q4H Travis Palmer, MD      . albuterol (PROVENTIL) (5 MG/ML) 0.5% nebulizer solution 5 mg  5 mg Nebulization Once Travis B. Bernette Mayers, MD      . Melene Muller ON 05/18/2013] azithromycin [name]) tablet 250 mg  250 mg Oral Daily Travis Palmer, MD      . buPROPion [name]) tablet 75 mg  75 mg Oral BID Travis Palmer, MD      . dronedarone (  MULTAQ) tablet 400 mg  400 mg Oral BID WC Travis Palmer, MD      . guaiFENesin (MUCINEX) 12 hr tablet 600 mg  600 mg Oral BID Travis Palmer, MD      . heparin injection 5,000 Units  5,000 Units Subcutaneous Q8H Travis Palmer, MD      . ipratropium (ATROVENT) nebulizer solution 0.5 mg  0.5 mg Nebulization Q4H Travis Palmer, MD      . Melene Muller ON 05/18/2013] lisinopril (PRINIVIL,ZESTRIL) tablet 5 mg  5 mg Oral Daily Travis Palmer, MD      . methylPREDNISolone sodium succinate (SOLU-MEDROL) 125 mg/2 mL injection 80 mg  80 mg Intravenous Q8H Travis Palmer, MD      . Melene Muller ON 05/18/2013] metoprolol succinate (TOPROL-XL) 24 hr tablet 50 mg  50 mg Oral Daily Travis Palmer, MD      .  oxyCODONE-acetaminophen (PERCOCET/ROXICET) 5-325 MG per tablet 1 tablet  1 tablet Oral Q6H PRN [name], [name]      . [START ON 05/18/2013] pantoprazole (PROTONIX) EC tablet 40 mg  40 mg Oral Daily Travis Palmer, MD      . predniSONE (DELTASONE) tablet 60 mg  60 mg Oral Q breakfast Travis Palmer, MD   60 mg at 05/17/13 1945  . sodium chloride 0.9 % injection 3 mL  3 mL Intravenous Q12H Travis Palmer, MD      . sodium chloride 0.9 % injection 3 mL  3 mL Intravenous Q12H Travis Palmer, MD      . warfarin (COUMADIN) tablet 10 mg  10 mg Oral Once Travis Spain, MD      . Melene Muller ON 05/18/2013] Warfarin - Pharmacist Dosing Inpatient   Does not apply [id number] Travis Spain, MD        Allergies: Allergies as of 05/17/2013 - Review Complete 05/17/2013  Allergen Reaction Noted  . Digoxin and related  05/17/2013  . Penicillins Rash    Past Medical History  Diagnosis Date  . Hypertension   . CKD (chronic kidney disease)     Renal U/S 12/04/2009 showed no pathological findings. Labs 12/04/2009 include normal ESR, C3, C4; neg ANA; SPEP showed nonspecific increase in the alpha-2 region with no M-spike; UPEP showed no monoclonal free light chains; urine IFE showed polyclonal increase in feree Kappa and/or free Lambda light chains. Baseline Cr reported 1.7-2.5.  [name] Chronic systolic heart failure     a. NICM - EF 35% with normal cors 2003. b. Last echo 11/2012 - EF 25-30%.  . Depression   . GERD (gastroesophageal reflux disease)   . Portal vein thrombosis   . Avascular necrosis     right hip s/p replacement  . Gastritis   . Alcohol abuse   . Erectile dysfunction   . Bipolar disorder   . Hydrocele, unspecified   . Intermittent claudication   . Lung nodule     Chest CT scan on 12/14/2008 showed a nodular opacity at the left lung base felt to most likely represent scarring.  Follow-up chest CT scan on 06/02/2010 showed parenchymal scarring in the left apex, left  lower lobe, and lingula;  some of this scarring at the left base had a nodular appearance, unchanged. Chest 09/2012 - stable.  . 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  Thyroid scan 08/28/2009 was normal with no focal areas of abnormal increased or decreased activity seen; the uptake of I 131 sodium iodide at 24 hours was 5.7%.  TSH and free T4 normalized by 08/16/2009.  [name] Pneumonia   . Intestinal obstruction   . COPD (chronic obstructive pulmonary disease)   . Clostridium difficile colitis   . E coli bacteremia   . Cluster headaches   . DVT (deep venous thrombosis)     He has hypercoagulability with multiple prior DVTs  . Pleural plaque     H/o asbestos exposure. Chest CT on 06/02/2010 showed stable extensive calcified pleural plaques involving the left hemithorax, consistent with asbestos related pleural disease.  . Weight loss     Normal colonoscopy by Dr. [name] on 11/06/2010.  . Tachycardia-bradycardia syndrome     a. 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 b. [employer] gen change 2010.  [name] PAF (paroxysmal atrial fibrillation)     Paroxysmal, failed medical therapy with amiodarone but has done very well with multaq  . Osteoarthritis   . Spondylosis   . Chronic low back pain     "cause of my hip" (03/17/2012)  . 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  . Noncompliance   . Atrial flutter   . Thrombocytopenia    Past Surgical History  Procedure Laterality Date  . Left hip bipolar hemiarthroplasty  09/09/2002    By Dr. Kristeen Miss  . Insert / replace / remove pacemaker  02/2003    By Dr. Charolette Child, gen change by Fawn Kirk  . Total hip arthroplasty  03/08/2008    right; By Dr. Charlesetta Shanks   Family History  Problem Relation Age of Onset  . Hypertension Mother   . Cancer Brother     Unsure of type.  . Asthma Mother   . Coronary artery disease Mother   . Colon  cancer Neg Hx   . Lung cancer Neg Hx   . Prostate cancer Neg Hx    History   Social History  . Marital Status: Widowed    Spouse Name: N/A    Number of Children: 10  . Years of Education: N/A   Occupational History  . RETIRED    Social History Main Topics  . Smoking status: Current Every Day Smoker -- 0.50 packs/day for 50 years    Types: Cigarettes  . Smokeless tobacco: Never Used  . Alcohol Use: No     Comment: prior etoh abuse  . Drug Use: 1.00 per week    Special: Marijuana     Comment: prior marijuana  . Sexual Activity: Yes   Other Topics Concern  . Not on file   Social History Narrative   Works as a Media planner part time   Smoker 1 pack last 1.5 days tobacco, smokes marijuana    Denies EtOH x 4 years (on 10/12/12)   12 kids    From [place]   [place] Veteran    12 grade education              Review of Systems: Review of Systems  Constitutional: Positive for weight loss and malaise/fatigue. Negative for fever, chills and diaphoresis.  Eyes: Negative for blurred vision.  Respiratory: Positive for cough, sputum production, shortness of breath and wheezing. Negative for hemoptysis.   Cardiovascular: Positive for chest pain and palpitations. Negative for leg swelling.  Gastrointestinal: Negative for nausea, vomiting, abdominal pain, diarrhea, constipation and blood in stool.  Genitourinary: Negative for dysuria, urgency, frequency and hematuria.  Musculoskeletal: Negative for falls and myalgias.  Skin: Negative for  rash.  Neurological: Positive for dizziness, sensory change (chronic in b/l feet), loss of consciousness, weakness and headaches.     Physical Exam: Blood pressure 147/106, pulse 69, temperature 98.3 F (36.8 C), temperature source Oral, resp. rate 18, height 6\' 3"  (1.905 m), weight 64.3 kg (141 lb 12.1 oz), SpO2 99.00%. Physical Exam  Constitutional: He is oriented to person, place, and time. No distress.  Thin appearing  HENT:  Head:  Normocephalic and atraumatic.  Right Ear: External ear normal.  Left Ear: External ear normal.  Nose: Nose normal.  Mouth/Throat: Oropharynx is clear and moist. No oropharyngeal exudate.  Eyes: Conjunctivae and EOM are normal. Pupils are equal, round, and reactive to light. Right eye exhibits no discharge. Left eye exhibits no discharge.  Neck: Normal range of motion. Neck supple.  Cardiovascular: Normal rate, regular rhythm and normal heart sounds.   Pulmonary/Chest: No respiratory distress. He has wheezes. He exhibits no tenderness.  Diffuse ronchi with expiratory wheezing worse left>right upper lung fields  Abdominal: Soft. Bowel sounds are normal. He exhibits no distension. There is no tenderness. There is no rebound and no guarding.  Musculoskeletal: Normal range of motion.  Neurological: He is alert and oriented to person, place, and time. No cranial nerve deficit.  Pt refused to ambulate Decreased sensation to light touch of b/l feet  Skin: Skin is warm and dry. No rash noted. He is not diaphoretic. No erythema. No pallor.  Darkened skin of b/l feet   Psychiatric: He has a normal mood and affect. His behavior is normal. Judgment and thought content normal.     Lab results: Basic Metabolic Panel:  Recent Labs  47/82/95 1225  NA 143  K 4.0  CL 107  CO2 25  GLUCOSE 71  BUN 18  CREATININE 1.74*  CALCIUM 9.2   CBC:  Recent Labs  05/17/13 1225  WBC 7.5  HGB 15.3  HCT 46.5  MCV 90.1  PLT 133*   Cardiac Enzymes:  Recent Labs  05/17/13 1945  TROPONINI <0.30   BNP:  Recent Labs  05/17/13 1225  PROBNP 6363.0*   Coagulation:  Recent Labs  05/17/13 1225  LABPROT 13.7  INR 1.07   Urine Drug Screen: Drugs of Abuse     Component Value Date/Time   LABOPIA POSITIVE* 10/12/2012 1724   LABOPIA NEGATIVE 12/27/2008 1943   COCAINSCRNUR NONE DETECTED 10/12/2012 1724   COCAINSCRNUR NEG 07/06/2009 2225   LABBENZ NONE DETECTED 10/12/2012 1724   LABBENZ NEG  07/06/2009 2225   AMPHETMU NONE DETECTED 10/12/2012 1724   AMPHETMU NEG 07/06/2009 2225   THCU POSITIVE* 10/12/2012 1724   LABBARB NONE DETECTED 10/12/2012 1724    Alcohol Level: No results found for this basename: ETH,  in the last 72 hours Urinalysis: No results found for this basename: COLORURINE, APPERANCEUR, LABSPEC, PHURINE, GLUCOSEU, HGBUR, BILIRUBINUR, KETONESUR, PROTEINUR, UROBILINOGEN, NITRITE, LEUKOCYTESUR,  in the last 72 hours  Imaging results:  Dg Chest 2 View  05/17/2013   CLINICAL DATA:  Dizziness and shortness of breath.  EXAM: CHEST  2 VIEW  COMPARISON:  03/17/2012  FINDINGS: Dual lead pacemaker remains in place. The right lung again shows chronic hyperinflation. Nipple shadow present on the right. The left lung shows chronic pleural and parenchymal scarring, not significantly changed. No evidence of active pneumonia, heart failure, effusion or collapse.  IMPRESSION: Chronic lung disease with hyperinflation on the right and extensive pleural parenchymal scarring on the left. No change compared previous studies.   Electronically Signed  By: Paulina Fusi M.D.   On: 05/17/2013 13:03   Nm Pulmonary Perf And Vent  05/17/2013   CLINICAL DATA:  History of DVT and COPD, now with shortness of breath for 2 weeks, evaluate for pulmonary embolism  EXAM: NUCLEAR MEDICINE VENTILATION - PERFUSION LUNG SCAN  TECHNIQUE: Ventilation images were obtained in multiple projections using inhaled aerosol technetium 99 M DTPA. Perfusion images were obtained in multiple projections after intravenous injection of Tc-29m MAA.  COMPARISON:  Chest radiograph - 05/17/2013; 08/14/2011; chest CT -03/18/2012; VQ scan - 03/18/2012  RADIOPHARMACEUTICALS:  40 mCi Tc-59m DTPA aerosol and 6 mCi Tc-36m MAA  FINDINGS: Review of chest radiograph performed earlier same day demonstrates grossly unchanged borderline enlarged cardiac silhouette and mediastinal contours. A pacer power pack overlies the peripheral aspect of the  right upper/mid lung. The lungs remain hyperexpanded. There is grossly unchanged architectural distortion and volume loss primarily involving the left mid lung. Grossly unchanged elevation of the left hemidiaphragm and left apical asymmetric pleural parenchymal thickening. Sequela of prior talc pleurodesis involving the left lung is not well demonstrated for was seen on remote chest CT. No definite pleural effusion or pneumothorax.  Ventilation: There is clumping of inhaled radiotracer about the bilateral pulmonary hila, right greater than left, similar to prior V/Q scan. There is grossly unchanged hypoventilation of the left mid and lower lungs with mottled undulation of the left upper lung. There is preferential ventilation of the right mid and lower lung. There is an expected photopenic defect at the location of the right anterior chest wall pacemaker power pack. Ingested radiotracer is seen within the hypopharynx distal esophagus and stomach.  Perfusion: There is matched hypoperfusion involving in the left mid and lower lung with matched mottled perfusion involving the remainder of the left lung. There is an expected photopenia defect at the location of the patient's known right anterior chest wall power pack. Otherwise, there is relative homogeneous perfusion of the right lung parenchyma.  IMPRESSION: 1. Pulmonary embolism absent (low probability for pulmonary embolism) - grossly unchanged from prior V/Q scan performed 02/2012. 2. Persistent volume loss involving the left mid and lower lung with associated matched decreased ventilation and perfusion.   Electronically Signed   By: Simonne Come M.D.   On: 05/17/2013 16:45    Other results:  EKG: Ventricular Rate: 67  PR Interval: 146  QRS Duration: 78  QT Interval: 410  QTC Calculation: 433  R Axis: 6  Text Interpretation: Sinus rhythm with Premature atrial complexes ST \\T \ T wave abnormality, consider inferior ischemia ST \\T \ T wave abnormality,  consider anterolateral ischemia Abnormal ECG Since last tracing deep T wave inversions new    Assessment & Plan by Problem: Principal Problem:   COPD exacerbation Active Problems:   TOBACCO ABUSE   HYPERTENSION   PAF (paroxysmal atrial fibrillation) on chronic coumadin   COPD (chronic obstructive pulmonary disease)   WEIGHT LOSS   Long-term (current) use of anticoagulants   Chronic combined systolic and diastolic congestive heart failure   Lightheadedness   SOB (shortness of breath)   Non-ischemic cardiomyopathy   Unstable angina  Assessment: 67 y.o. man with past medical history of for atrial fibrillation on metoprolol, dronedarone and noncompliance with coumadin therapy with prior compliance issues (last INR 2.1 on 04/12/2013), hypertension, CKD, COPD, tachy-brady syndrome with pacemaker, combined CHF with nonischemic cardiomyopathy EF 25-30% who presents with dyspnea and change in sputum production and consistency for the past month with cough worse in the past week.  Plan:  COPD exacerbation Pt with last  PFTs on 08/2010 with FEV1/FVC of 71% with FEV1 of 78% indicating Stage 2 moderate disease. Pt who presented with wheezing, dyspnea, productive cough with increased sputum production and change in consistency without fever, leukocytosis, or CXR findings concerning for infectious etiology. Pt with normal oxygen saturation on room air.   -IV Solumedrol with transition to oral 60 mg prednisone x 5 days -Breathing treamtent Q4hr  -PO azithromycin 500 mg x 1 day, then 250 mg daily x  days  -Mucinex 600 mg BID  -Pulse oximetry monitoring and -Oxygen therapy as needed to keep SpO2> 92% -Consider outpatient PFT  Unstable Angina - Pt with intermittent exertional CP  also occuring at rest with associated dyspnea and 12-lead EKG changes with new t-wave inversions and negative initial troponin. Pt with normal nuclear stress test on 07/08/12. TIMI score of 2 with 8% risk at 14 days of all-cause  mortality, new or recurrent MI, or severe recurrent ischemia requiring urgent revascularization. -Cycle troponins -Repeat 12-lead EKG in AM -Obtain 2D-Echo -Aspirin 325 mg    -Start IV heparin in setting of possible ACS, thromboembolism, and sub therapeutic INR -Appreciate cardiology consult --> favor repolarization changes on 12-lead EKG rather than ACS  Lightheadedness - Pt with history of tachy-brady syndrome with pacemaker. Pt with episodes of possible arrythmia vs bradycardia (HR 58 in ED) in setting of pre-syncope/syncope. -Orthostatic vitals negative  -Interrogate pacemaker  -Appreciate cardiology consult   -PT/OT consult   Weight loss - Pt reports unintentional 50 lb weight loss in last year with normal colonoscopy yin 10/2010, with repeat in 10 years. 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 Also with small stable pulmonary nodules on CT chest on 09/2012. Unclear etiology with ongoing work-up being performed as outpatient. -Consider repeat CT chest   Paroxysmal Atrial Fibrillation - Pt on rate control with metoprolol, rhythm control with dronedarone, and AC with history of noncompliance with coumadin with subtherapeutic  INR (1.1) on admission -Continue metoprolol succinate 50 mg daily -Continue dronedarone 400 mg BID -Continue coumadin per pharmacy   Combined Systolic and Diastolic CHF - Pt with EF of 25-30% on 2D-echo with moderate LVH, and grade 1 diastolic dysfunction on 11/2012 with pro-BNP of 6363 on admission without signs of volume overload on exam or CXR .  -Obtain 2D-echo -Appreciate cardiology recs  Hypertension - on admission pt with BP of 135/86  -Continue lisinopril 5 mg daily  -Continue metoprolol succinate 50 mg daily   CKD Stage 3 - Pt with baseline Cr 2.0 who presented with Cr of 1.74 on admission. -Avoid nephrotoxins -Continue to monitor renal function  Code: Full  Diet: Regular  DVT  PPx: IV Heparin   Dispo: Disposition is deferred at this time, awaiting improvement of current medical problems. Anticipated discharge in approximately 2 day(s).   The patient does have a current PCP Travis Ly, MD) and does need an [name] hospital follow-up appointment after discharge.  The patient does not have transportation limitations that hinder transportation to clinic appointments.  Signed: Otis Brace, MD 05/17/2013, 7:49 PM

## 2013-05-17 NOTE — Progress Notes (Signed)
Subjective:    Patient ID: [name]., male    DOB: [birth year], 67 y.o.   MRN: [id number]  HPI TravisElisah F Moosa [name]. is a 67 y.o. man with past medical history significant for atrial fibrillation on metoprolol, dronedarone (Multaq) antidysrhythmic and Coumadin therapy with prior compliance issues (last INR 2.1 on 04/12/2013).  He also also has hypertension, COPD, tachy-brady syndrome and diastolic CHF with nonischemic cardiomyopathy EF 25-30% s/p permanent pacemaker placement.  He states that while shopping 9 days ago he felt dizzy, fast heart rate, hyperventilation with shortness of breath.  This has not resolved. He has had complaints of lightheadedness and shortness of breath in the past and is followed by Dr. Johney Frame of Cardiology. Reports compliance with all meds including he Coumadin and Multaq. States that he hasn't had much of an appetite and has felt sick on his stomach for over a week. Reports watery stool for past 3 days. Reports smoking 2-3 cigarettes per day for the past month.  On further query after INR returned subtherapeutic pt admitted that he has not been compliant with Coumadin therapy.  Given complaints of palpitations, shortness of breath, h/o of thrombosis of major vessel (portal vein), and subtherapeutic INR will recommend ED evaluation to rule out Pulmonary Embolism.      Review of Systems  Constitutional: Positive for fatigue. Negative for fever and chills.  HENT: Negative.   Eyes: Negative.   Respiratory: Positive for shortness of breath. Negative for cough, chest tightness and wheezing.   Cardiovascular: Positive for palpitations. Negative for chest pain and leg swelling.  Gastrointestinal: Positive for nausea, vomiting and diarrhea. Negative for constipation and blood in stool.  Endocrine: Negative.   Genitourinary: Negative.   Skin: Negative.   Allergic/Immunologic: Negative.   Neurological: Positive for dizziness, weakness and light-headedness. Negative  for seizures and headaches.  Hematological: Negative.   Psychiatric/Behavioral: Negative.        Objective:   Physical Exam  Constitutional: He is oriented to person, place, and time. He appears well-developed and well-nourished.  Thin AA male  HENT:  Head: Normocephalic and atraumatic.  Eyes: Conjunctivae and EOM are normal. Pupils are equal, round, and reactive to light.  Neck: Neck supple. No thyromegaly present.  Cardiovascular: Normal rate.  An irregularly irregular rhythm present.  No murmur heard. Pulmonary/Chest: Effort normal and breath sounds normal.  Abdominal: Soft. Bowel sounds are normal.  scaffoid  Neurological: He is alert and oriented to person, place, and time.  Skin: Skin is warm and dry.  Psychiatric: He has a normal mood and affect.          Assessment & Plan:  See separate problem-list charting:  #1 Lightheadedness: no report of room spinning, was to previously be worked up with tilt table in the past but sx resolved. No h/o prior panic attacks. -check EKG especially given Multaq side effect of QTc prologation--> defer to ED -consider referral back to [facility] for tilt table if PE negative  #2 Atrial fibrillation: rate controlled at 70 bpm today on metoprolol 50 mg qd -check INR if therapeutic, low likelihood PE ---> INR 1.1, on further query pt reports that he hasnt had Coumadin in at least 2 days.  #3 hypertension: above goal today 154/100: on metoprolol, lisinopril  #4 diarrhea: this could be side effect of Multaq as well or possible gastroenteritis  #5 subtherapeutic INR: if PE would suggest Xarelto 15 mg bid x 20 days then on day 22 20 mg qd; if  no PE would strongly consider aspirin therapy which has 30% relative risk reduction as pt is highly non-compliant and rarely therapeutic on Coumadin

## 2013-05-17 NOTE — ED Notes (Signed)
Pt sent [facility] from [facility].  Pt with 8-9 day history of CP, SOB, and cough.  Pt states cough is productive with thick white sputum.

## 2013-05-18 ENCOUNTER — Other Ambulatory Visit: Payer: Self-pay

## 2013-05-18 DIAGNOSIS — E43 Unspecified severe protein-calorie malnutrition: Secondary | ICD-10-CM

## 2013-05-18 DIAGNOSIS — I369 Nonrheumatic tricuspid valve disorder, unspecified: Secondary | ICD-10-CM

## 2013-05-18 DIAGNOSIS — J441 Chronic obstructive pulmonary disease with (acute) exacerbation: Principal | ICD-10-CM

## 2013-05-18 DIAGNOSIS — I498 Other specified cardiac arrhythmias: Secondary | ICD-10-CM

## 2013-05-18 LAB — BASIC METABOLIC PANEL
BUN: 23 mg/dL (ref 6–23)
Calcium: 8.8 mg/dL (ref 8.4–10.5)
GFR calc Af Amer: 49 mL/min — ABNORMAL LOW (ref >90)
GFR calc non Af Amer: 43 mL/min — ABNORMAL LOW (ref >90)
Glucose, Bld: 231 mg/dL — ABNORMAL HIGH (ref 70–99)
Potassium: 4.2 mEq/L (ref 3.7–5.3)

## 2013-05-18 LAB — TROPONIN I: Troponin I: <0.3 ng/mL

## 2013-05-18 LAB — CBC
Hemoglobin: 14 g/dL (ref 13.0–17.0)
MCH: 30.8 pg (ref 26.0–34.0)
MCHC: 33.7 g/dL (ref 30.0–36.0)
Platelets: 128 10*3/uL — ABNORMAL LOW (ref 150–400)
RDW: 14.7 % (ref 11.5–15.5)

## 2013-05-18 LAB — GLUCOSE, CAPILLARY: Glucose-Capillary: 138 mg/dL — ABNORMAL HIGH (ref 70–99)

## 2013-05-18 LAB — PROTIME-INR
INR: 1.07 (ref 0.00–1.49)
Prothrombin Time: 13.7 s (ref 11.6–15.2)

## 2013-05-18 LAB — HEPARIN LEVEL (UNFRACTIONATED): Heparin Unfractionated: 0.35 IU/mL (ref 0.30–0.70)

## 2013-05-18 MED ORDER — ALBUTEROL SULFATE (2.5 MG/3ML) 0.083% IN NEBU: 2.5000 mg | INHALATION_SOLUTION | Freq: Three times a day (TID) | RESPIRATORY_TRACT | Status: DC

## 2013-05-18 MED ORDER — BOOST / RESOURCE BREEZE PO LIQD
1.0000 | Freq: Two times a day (BID) | ORAL | Status: DC
  Administered 2013-05-19: 10:00:00 1 via ORAL

## 2013-05-18 MED ORDER — IPRATROPIUM-ALBUTEROL 0.5-2.5 (3) MG/3ML IN SOLN: 3.0000 mL | Freq: Three times a day (TID) | RESPIRATORY_TRACT | Status: DC

## 2013-05-18 MED ORDER — HYDROCORTISONE 0.5 % EX CREA
TOPICAL_CREAM | Freq: Two times a day (BID) | CUTANEOUS | Status: DC | PRN
  Filled 2013-05-18: qty 28.35

## 2013-05-18 MED ORDER — COUMADIN BOOK
Freq: Once | Status: AC
  Administered 2013-05-18: 1
  Filled 2013-05-18: qty 1

## 2013-05-18 MED ORDER — DRONABINOL 2.5 MG PO CAPS
2.5000 mg | ORAL_CAPSULE | Freq: Two times a day (BID) | ORAL | Status: DC
  Administered 2013-05-18 – 2013-05-19 (×2): 2.5 mg via ORAL
  Filled 2013-05-18 (×2): qty 1

## 2013-05-18 MED ORDER — WARFARIN SODIUM 2.5 MG PO TABS
12.5000 mg | ORAL_TABLET | Freq: Once | ORAL | Status: AC
  Administered 2013-05-18: 17:00:00 12.5 mg via ORAL
  Filled 2013-05-18: qty 1

## 2013-05-18 MED ORDER — METOPROLOL SUCCINATE ER 25 MG PO TB24
25.0000 mg | ORAL_TABLET | Freq: Every day | ORAL | Status: DC
  Administered 2013-05-19: 10:00:00 25 mg via ORAL
  Filled 2013-05-18: qty 1

## 2013-05-18 MED ORDER — OXYCODONE-ACETAMINOPHEN 5-325 MG PO TABS
1.0000 | ORAL_TABLET | Freq: Four times a day (QID) | ORAL | Status: DC | PRN
  Administered 2013-05-18: 2 via ORAL
  Administered 2013-05-18: 1 via ORAL
  Administered 2013-05-19 (×2): 2 via ORAL
  Filled 2013-05-18 (×3): qty 2
  Filled 2013-05-18: qty 1

## 2013-05-18 MED ORDER — IPRATROPIUM-ALBUTEROL 0.5-2.5 (3) MG/3ML IN SOLN
3.0000 mL | Freq: Three times a day (TID) | RESPIRATORY_TRACT | Status: DC
  Administered 2013-05-18 – 2013-05-19 (×4): 3 mL via RESPIRATORY_TRACT
  Filled 2013-05-18 (×4): qty 3

## 2013-05-18 MED ORDER — ENSURE COMPLETE PO LIQD
237.0000 mL | Freq: Two times a day (BID) | ORAL | Status: DC
  Administered 2013-05-18: 14:00:00 237 mL via ORAL

## 2013-05-18 NOTE — Care Management Note (Addendum)
    Page 1 of 2   05/19/2013     12:58:40 PM   CARE MANAGEMENT NOTE 05/19/2013  Patient:  [name], [name]   Account Number:  [id number]  Date Initiated:  05/18/2013  Documentation initiated by:  HUTCHINSON,CRYSTAL  Subjective/Objective Assessment:   Admitted under Observation: shortness of breath.     Action/Plan:   CM will monitor for disposition needs   Anticipated DC Date:  05/21/2013   Anticipated DC Plan:        DC Planning Services  CM consult      PAC Choice  DURABLE MEDICAL EQUIPMENT   Choice offered to / List presented to:  C-1 Patient   DME arranged  OXYGEN  SHOWER STOOL      DME agency  [facility]     HH arranged  HH-1 RN  HH-2 PT      [facility] agency  [facility]   Status of service:  Completed, signed off Medicare Important Message given?   (If response is "NO", the following Medicare IM given date fields will be blank) Date Medicare IM given:   Date Additional Medicare IM given:    Discharge Disposition:    Per UR Regulation:  Reviewed for med. necessity/level of care/duration of stay  If discussed at Long Length of Stay Meetings, dates discussed:    Comments:  05/19/2013 PT Recs:  Home health PT;Supervision for mobility/OOB DME in the home: cane, walker, 3n1 Patient will d/c home on O2: 2 L/m via [place] SATURATION QUALIFICATIONS: (This note is used to comply with regulatory documentation for home oxygen) Patient Saturations on Room Air at Rest = 99% Patient Saturations on Room Air while Ambulating = 83% Patient Saturations on 2 Liters of oxygen while Ambulating = 100% DME:  O2, shower chair ordered:  [facility]:  contact [facility]:  PT, RN (COPD Disease MGMT, medication mgmt)  (MD notified of face to face needed) ADD:  05/19/2013 No other needs identified. Crystal Hutchinson RN, BSN, MSHL, CCM 05/19/2013    Please briefly explain why patient needs home oxygen: Patient's Saturations dropped to 83% while walking on [employer], BSN, [place], CCM 05/19/2013   PT Recs:  Home health PT; Supervision for mobility/OOB Shaletta Hinostroza:  pending Donato Schultz RN, BSN, MSHL, CCM 05/18/2013

## 2013-05-18 NOTE — Progress Notes (Signed)
Patient's Heparin Drip has been discontinued per PA, patient is stable and shows no signs of distress; will continue to monitor patient. Lorretta Harp RN

## 2013-05-18 NOTE — Evaluation (Signed)
Physical Therapy Evaluation Patient Details Name: Travis BARTEL Sr. MRN: [id number] DOB: 1945-06-12 Today's Date: 05/18/2013 Time: 7829-5621 PT Time Calculation (min): 15 min  PT Assessment / Plan / Recommendation History of Present Illness  Mr.Travis F Lebeau Sr. is a 67 y.o. man with past medical history of for atrial fibrillation on metoprolol, dronedarone and noncompliance with coumadin therapy with prior compliance issues (last INR 2.1 on 04/12/2013), hypertension, CKD, COPD, tachy-brady syndrome with pacemaker, diastolic CHF with nonischemic cardiomyopathy EF 25-30% who presents with shortness of breath and productive cough of thick phlegm for the past month with cough worse in the past week. Pt reports 1 month ago he began having shortness of breath that is worse with activity.   Clinical Impression  Presents today with overall supervision for mobility and gait with small based quad cane.  Note some instability with gait and also that his SaO2 on RA dropped to 86% during ambulation.  He was able to increase sats to 94% with pursed lip breathing with HR at 45-50bpm following activity.  Pt states he felt somewhat dizzy following gait, however improved quickly when sitting at EOB.  Feel pt will benefit from continued acute services in order to address decreased activity tolerance, balance and gait training.  Recommend HHPT for follow up at D/C for increased safety and mobility.     PT Assessment  Patient needs continued PT services    Follow Up Recommendations  Home health PT;Supervision for mobility/OOB    Does the patient have the potential to tolerate intense rehabilitation      Barriers to Discharge        Equipment Recommendations  None recommended by PT    Recommendations for Other Services     Frequency Min 3X/week    Precautions / Restrictions Precautions Precautions: Fall Restrictions Weight Bearing Restrictions: No   Pertinent Vitals/Pain 7/10 pain in hips, RN made  aware.       Mobility  Bed Mobility Bed Mobility: Supine to Sit Supine to Sit: 6: Modified independent (Device/Increase time);With rails Details for Bed Mobility Assistance: Pt requires increased time and use of rails to complete task.  Transfers Transfers: Sit to Stand;Stand to Sit Sit to Stand: 5: Supervision;From bed Stand to Sit: 5: Supervision;To bed Details for Transfer Assistance: Min cues for safety as pt was somewhat impulsive to stand before therapist ready.  Ambulation/Gait Ambulation/Gait Assistance: 5: Supervision Ambulation Distance (Feet): 200 Feet Assistive device: Small based quad cane Ambulation/Gait Assistance Details: Pt tolerated ambulation in hallway with use of person small based quad cane at supervision level.  Min cues for upright posture, safety when turning and also maintaining contact of quad cane with floor (all four points).  Gait Pattern: Step-through pattern;Decreased stride length;Trunk flexed Stairs: No Wheelchair Mobility Wheelchair Mobility: No    Exercises     PT Diagnosis: Difficulty walking;Abnormality of gait;Generalized weakness;Acute pain  PT Problem List: Decreased strength;Decreased activity tolerance;Decreased balance;Decreased mobility;Pain;Cardiopulmonary status limiting activity PT Treatment Interventions: DME instruction;Gait training;Stair training;Functional mobility training;Therapeutic activities;Therapeutic exercise;Balance training;Patient/family education     PT Goals(Current goals can be found in the care plan section) Acute Rehab PT Goals Patient Stated Goal: to feel better PT Goal Formulation: With patient Time For Goal Achievement: 05/25/13 Potential to Achieve Goals: Good  Visit Information  Last PT Received On: 05/18/13 Assistance Needed: +1 History of Present Illness: Mr.Travis F Matranga Sr. is a 67 y.o. man with past medical history of for atrial fibrillation on metoprolol, dronedarone and noncompliance with  coumadin therapy with prior compliance issues (last INR 2.1 on 04/12/2013), hypertension, CKD, COPD, tachy-brady syndrome with pacemaker, diastolic CHF with nonischemic cardiomyopathy EF 25-30% who presents with shortness of breath and productive cough of thick phlegm for the past month with cough worse in the past week. Pt reports 1 month ago he began having shortness of breath that is worse with activity.        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 - 2 wheels Prior Function Level of Independence: Independent with assistive device(s) Communication Communication: No difficulties    Cognition  Cognition Arousal/Alertness: Awake/alert Behavior During Therapy: WFL for tasks assessed/performed Overall Cognitive Status: Within Functional Limits for tasks assessed    Extremity/Trunk Assessment Lower Extremity Assessment Lower Extremity Assessment: Generalized weakness (has increased pain in B hips from previous THAs) Cervical / Trunk Assessment Cervical / Trunk Assessment: Normal   Balance Balance Balance Assessed: Yes Static Standing Balance Static Standing - Balance Support: No upper extremity supported Static Standing - Level of Assistance: 5: Stand by assistance  End of Session PT - End of Session Activity Tolerance: Patient limited by fatigue;Patient tolerated treatment well Patient left: in bed;with call bell/phone within reach Nurse Communication: Mobility status (SaO2 on RA during amb)  GP Functional Assessment Tool Used: Clinical judgement Functional Limitation: Mobility: Walking and moving around Mobility: Walking and Moving Around  Current Status (Z6109): At least 1 percent but less than 20 percent impaired, limited or restricted (supervision level) Mobility: Walking and Moving Around Goal Status [phone]): 0 percent impaired, limited or restricted   Vista Deck 05/18/2013, 10:59 AM

## 2013-05-18 NOTE — Progress Notes (Signed)
UR completed 

## 2013-05-18 NOTE — Progress Notes (Signed)
Echocardiogram 2D Echocardiogram has been performed.  Dorothey Baseman 05/18/2013, 3:47 PM

## 2013-05-18 NOTE — Progress Notes (Signed)
Patient is very verbally aggressive and noncompliant, patient states "I can have all the drink and salt I want, ya'll don't want to hear the wrath of me again", patient is upset because he does not want adhere to heart healthy diet or fluid restrictions; will continue to monitor patient. Lorretta Harp RN

## 2013-05-18 NOTE — Progress Notes (Addendum)
INITIAL NUTRITION ASSESSMENT  DOCUMENTATION CODES Per approved criteria  -Severe malnutrition in the context of chronic illness -Underweight   INTERVENTION: Add Resource Breeze po BID, each supplement provides 250 kcal and 9 grams of protein Encouraged high kcal/high protein intake. Pt refusing diet education at this time - states he will not follow a low sodium restriction. If pt shows interest in education, please re-consult. RD to continue to follow nutrition care plan.  **RD contacted by MD for recommendations for lower potassium-containing supplement, will add Resource Breeze instead of Ensure Complete.  NUTRITION DIAGNOSIS: Increased nutrient needs related to COPD as evidenced by estimated needs.   Goal: Intake to meet >90% of estimated nutrition needs.  Monitor:  weight trends, lab trends, I/O's, PO intake, supplement tolerance  Reason for Assessment: Malnutrition Screening Tool  67 y.o. male  Admitting Dx: COPD exacerbation  ASSESSMENT: PMHx significant for afib, HTN, CKD, COPD, pacemaker, CHF. Admitted with SOB and productive cough x 1 month, worsening within the past week. Work-up reveals COPD exacerbation.  Pt with 14% wt loss x 4 months - this is significant for time frame. Currently eating 100% of Regular meals. Was not eating this well at home, states that he eats bites of meals and has been doing this for 1-2 years. Has been on Megace but said it was discontinued 2/2 hx of blood clots. He states that Dr. Rogelia Boga just came by to see him and is planning to prescribe him something else for his appetite. Pt states that he used to drink Ensure regularly, but the price has increased significantly through his healthcare provider that was ordering it for him. Agreeable to receiving while here.   Pt is thin with severe muscle mass wasting in clavicles. Pt is eating, RD did not complete physical assessment.  Pt refuses to follow a low sodium diet order.   Per MD note, pt  with history of asbestos exposure and extensive left sided pleural calcifications without pleural effusion. Also with small stable pulmonary nodules on CT chest on 09/2012  Pt meets criteria for severe MALNUTRITION in the context of chronic illness as evidenced by 14% wt loss x 4 months, intake of <75% x at least 1 month and severe muscle mass loss.  Height: Ht Readings from Last 1 Encounters:  05/17/13 6\' 3"  (1.905 m)    Weight: Wt Readings from Last 1 Encounters:  05/18/13 138 lb 14.4 oz (63.005 kg)    Ideal Body Weight: 196 lb/89.1 kg  % Ideal Body Weight: 71%  Wt Readings from Last 10 Encounters:  05/18/13 138 lb 14.4 oz (63.005 kg)  05/17/13 144 lb 4.8 oz (65.454 kg)  02/05/13 153 lb 11.2 oz (69.718 kg)  01/05/13 161 lb 14.4 oz (73.437 kg)  12/28/12 163 lb (73.936 kg)  11/30/12 158 lb 12.8 oz (72.031 kg)  11/25/12 165 lb 6.4 oz (75.025 kg)  11/02/12 157 lb 4 oz (71.328 kg)  10/30/12 160 lb 12.8 oz (72.938 kg)  10/21/12 161 lb 3.2 oz (73.12 kg)    Usual Body Weight: 160 lb  % Usual Body Weight: 86%  BMI:  Body mass index is 17.36 kg/(m^2). Underweight  Estimated Nutritional Needs: Kcal: 1800 - 2000 Protein: 95 - 110 g Fluid: 1.8 - 2 liters  Skin: intact  Diet Order: General  EDUCATION NEEDS: -No education needs identified at this time   Intake/Output Summary (Last 24 hours) at 05/18/13 1133 Last data filed at 05/18/13 1042  Gross per 24 hour  Intake 1206.51 ml  Output    250 ml  Net 956.51 ml    Last BM: 12/29  Labs:   Recent Labs Lab 05/17/13 1225 05/18/13 0910  NA 143 141  K 4.0 4.2  CL 107 104  CO2 25 17*  BUN 18 23  CREATININE 1.74* 1.59*  CALCIUM 9.2 8.8  GLUCOSE 71 231*    CBG (last 3)   Recent Labs  05/18/13 0622  GLUCAP 138*    Scheduled Meds: . albuterol  2.5 mg Nebulization TID  . albuterol  5 mg Nebulization Once  . azithromycin  250 mg Oral Daily  . buPROPion  75 mg Oral BID  . coumadin book   Does not apply Once   . dronedarone  400 mg Oral BID WC  . guaiFENesin  600 mg Oral BID  . ipratropium  0.5 mg Nebulization TID  . lisinopril  5 mg Oral Daily  . [START ON 05/19/2013] metoprolol succinate  25 mg Oral Daily  . pantoprazole  40 mg Oral Daily  . predniSONE  60 mg Oral Q breakfast  . sodium chloride  3 mL Intravenous Q12H  . sodium chloride  3 mL Intravenous Q12H  . warfarin  12.5 mg Oral ONCE-1800  . Warfarin - Pharmacist Dosing Inpatient   Does not apply q1800    Continuous Infusions: . heparin 950 Units/hr (05/17/13 2123)    Past Medical History  Diagnosis Date  . Hypertension   . CKD (chronic kidney disease)     Renal U/S 12/04/2009 showed no pathological findings. Labs 12/04/2009 include normal ESR, C3, C4; neg ANA; SPEP showed nonspecific increase in the alpha-2 region with no M-spike; UPEP showed no monoclonal free light chains; urine IFE showed polyclonal increase in feree Kappa and/or free Lambda light chains. Baseline Cr reported 1.7-2.5.  [name] Chronic systolic heart failure     a. NICM - EF 35% with normal cors 2003. b. Last echo 11/2012 - EF 25-30%.  . Depression   . GERD (gastroesophageal reflux disease)   . Portal vein thrombosis   . Avascular necrosis     right hip s/p replacement  . Gastritis   . Alcohol abuse   . Erectile dysfunction   . Bipolar disorder   . Hydrocele, unspecified   . Intermittent claudication   . Lung nodule     Chest CT scan on 12/14/2008 showed a nodular opacity at the left lung base felt to most likely represent scarring.  Follow-up chest CT scan on 06/02/2010 showed parenchymal scarring in the left apex, left  lower lobe, and lingula; some of this scarring at the left base had a nodular appearance, unchanged. Chest 09/2012 - stable.  . 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  Thyroid scan 08/28/2009 was normal with no focal areas of abnormal increased or decreased activity seen; the uptake of I 131 sodium iodide  at 24 hours was 5.7%.  TSH and free T4 normalized by 08/16/2009.  [name] Pneumonia   . Intestinal obstruction   . COPD (chronic obstructive pulmonary disease)   . Clostridium difficile colitis   . E coli bacteremia   . Cluster headaches   . DVT (deep venous thrombosis)     He has hypercoagulability with multiple prior DVTs  . Pleural plaque     H/o asbestos exposure. Chest CT on 06/02/2010 showed stable extensive calcified pleural plaques involving the left hemithorax, consistent with asbestos related pleural disease.  . Weight loss  Normal colonoscopy by Dr. [name] on 11/06/2010.  . Tachycardia-bradycardia syndrome     a. 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 b. [employer] Jude gen change 2010.  [name] PAF (paroxysmal atrial fibrillation)     Paroxysmal, failed medical therapy with amiodarone but has done very well with multaq  . Osteoarthritis   . Spondylosis   . Chronic low back pain     "cause of my hip" (03/17/2012)  . 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  . Noncompliance   . Atrial flutter   . Thrombocytopenia     Past Surgical History  Procedure Laterality Date  . Left hip bipolar hemiarthroplasty  09/09/2002    By Dr. Kristeen Miss  . Insert / replace / remove pacemaker  02/2003    By Dr. Charolette Child, gen change by Fawn Kirk  . Total hip arthroplasty  03/08/2008    right; By Dr. Charlesetta Shanks    Jarold Motto MS, RD, LDN Pager: [phone] After-hours pager: [phone]

## 2013-05-18 NOTE — H&P (Signed)
  Date: 05/18/2013  Patient name: Travis STANNARD Sr.  Medical record number: [id number]  Date of birth: 10-22-1945   I have seen and evaluated Travis Capuchin Sr. and discussed their care with the Residency Team.   Assessment and Plan: I have seen and evaluated the patient as outlined above. I agree with the formulated Assessment and Plan as detailed in the residents' admission note, with the following changes:   1. Acute resp failure 2/2 COPD exac - [name] has responded well to tx with ABX, steroids, nebs, and O2. While his exam still reveals sig rhonchi and wheezing, he has decent air flow and feels symptomatically better. Will cont current tx and re-examine in AM.  2. Lightheadedness, fatigue, unsteady gait - this has been present about 2 weeks. Could be 2/2 COPD exac as no other etiology has been found. Cont symptomatic tx and PT/OT consult.   3. Moderate protein calorie malnutrition - Pt's BMI is now 18. His weight cont to decrease. He states he has no appetite but the prednisone always increases his appetite but he understands why I cannot Rx this long term. His loss of appetite is distressing to him. We have not found an etiology for the anorexia and weight loss. I abhor appetite stimulants but will make exception in this case since it is so distressing. Megace is out since h/o DVT. He states that THC used to stimulate his appetite but no longer does. I will try Marinol 2.5 BID but stressed if he does not gain weight, we will not cont it. Will need to stress that he should not take THC in any other form 2/2 risk OD. Will give for 4 weeks only and recheck weight at that time.   4. Rash - he has on R lower Abd quadrant and L post thoracic area for 2 months. Itchy. I had examined an area (mostly ecchymosis, now gone) in Nov on his forearm when he saw Dr Alexandria Lodge and we provided OTC HCT 0.5% cream. We can try this for these two areas. May need skin bx as the etiology is not clear. No scaling so  unlikely tinea.    Burns Spain, MD 12/30/201412:54 PM

## 2013-05-18 NOTE — Progress Notes (Signed)
Subjective:  Pt seen and examined in AM. No acute events overnight. Pt reports he feels much better today with improved cough. He denies fever, chills, dyspnea, wheezing, chest pain, and palpitations. His appetite significantly increased with prednisone. He was able to ambulate with PT without difficulty or lightheadedness.       Objective: Vital signs in last 24 hours: Filed Vitals:   05/18/13 0900 05/18/13 1049 05/18/13 1300 05/18/13 1542  BP: 142/66  122/70   Pulse: 64  66   Temp: 98.1 F (36.7 C)  98 F (36.7 C)   TempSrc: Oral  Oral   Resp: 18  18   Height:      Weight:      SpO2: 98% 86% 100% 100%   Weight change:   Intake/Output Summary (Last 24 hours) at 05/18/13 1612 Last data filed at 05/18/13 1452  Gross per 24 hour  Intake 1486.09 ml  Output    400 ml  Net 1086.09 ml    Physical Exam  Constitutional: He is oriented to person, place, and time. No distress.  Thin appearing  HENT:  Head: Normocephalic and atraumatic.  Right Ear: External ear normal.  Left Ear: External ear normal.  Nose: Nose normal.  Mouth/Throat: Oropharynx is clear and moist. No oropharyngeal exudate.  Eyes: Conjunctivae and EOM are normal. Pupils are equal, round, and reactive to light. Right eye exhibits no discharge. Left eye exhibits no discharge.  Neck: Normal range of motion. Neck supple.  Cardiovascular: Normal rate, regular rhythm and normal heart sounds.  Pulmonary/Chest: No respiratory distress. He exhibits no tenderness.  Diffuse ronchi with scattered expiratory wheezing Abdominal: Soft. Bowel sounds are normal. He exhibits no distension. There is no tenderness. There is no rebound and no guarding.  Musculoskeletal: Normal range of motion.  Neurological: He is alert and oriented to person, place, and time. No cranial nerve deficit.  Decreased sensation to light touch of b/l feet  Skin: Skin is warm and dry. No rash noted. He is not diaphoretic. No erythema. No pallor.    Darkened skin of b/l feet  Psychiatric: He has a normal mood and affect. His behavior is normal. Judgment and thought content normal.    Lab Results: Basic Metabolic Panel:  Recent Labs Lab 05/17/13 1225 05/18/13 0910  NA 143 141  K 4.0 4.2  CL 107 104  CO2 25 17*  GLUCOSE 71 231*  BUN 18 23  CREATININE 1.74* 1.59*  CALCIUM 9.2 8.8   CBC:  Recent Labs Lab 05/17/13 1225 05/18/13 0910  WBC 7.5 4.5  HGB 15.3 14.0  HCT 46.5 41.5  MCV 90.1 91.2  PLT 133* 128*   Cardiac Enzymes:  Recent Labs Lab 05/17/13 1945 05/18/13 0030  TROPONINI <0.30 <0.30   BNP:  Recent Labs Lab 05/17/13 1225  PROBNP 6363.0*   D-Dimer: No results found for this basename: DDIMER,  in the last 168 hours CBG:  Recent Labs Lab 05/18/13 0622  GLUCAP 138*   Coagulation:  Recent Labs Lab 05/17/13 1225 05/18/13 0910  LABPROT 13.7 13.7  INR 1.07 1.07   Studies/Results: Dg Chest 2 View  05/17/2013   CLINICAL DATA:  Dizziness and shortness of breath.  EXAM: CHEST  2 VIEW  COMPARISON:  03/17/2012  FINDINGS: Dual lead pacemaker remains in place. The right lung again shows chronic hyperinflation. Nipple shadow present on the right. The left lung shows chronic pleural and parenchymal scarring, not significantly changed. No evidence of active pneumonia, heart failure, effusion or  collapse.  IMPRESSION: Chronic lung disease with hyperinflation on the right and extensive pleural parenchymal scarring on the left. No change compared previous studies.   Electronically Signed   By: Paulina Fusi M.D.   On: 05/17/2013 13:03   Nm Pulmonary Perf And Vent  05/17/2013   CLINICAL DATA:  History of DVT and COPD, now with shortness of breath for 2 weeks, evaluate for pulmonary embolism  EXAM: NUCLEAR MEDICINE VENTILATION - PERFUSION LUNG SCAN  TECHNIQUE: Ventilation images were obtained in multiple projections using inhaled aerosol technetium 99 M DTPA. Perfusion images were obtained in multiple  projections after intravenous injection of Tc-8m MAA.  COMPARISON:  Chest radiograph - 05/17/2013; 08/14/2011; chest CT -03/18/2012; VQ scan - 03/18/2012  RADIOPHARMACEUTICALS:  40 mCi Tc-48m DTPA aerosol and 6 mCi Tc-29m MAA  FINDINGS: Review of chest radiograph performed earlier same day demonstrates grossly unchanged borderline enlarged cardiac silhouette and mediastinal contours. A pacer power pack overlies the peripheral aspect of the right upper/mid lung. The lungs remain hyperexpanded. There is grossly unchanged architectural distortion and volume loss primarily involving the left mid lung. Grossly unchanged elevation of the left hemidiaphragm and left apical asymmetric pleural parenchymal thickening. Sequela of prior talc pleurodesis involving the left lung is not well demonstrated for was seen on remote chest CT. No definite pleural effusion or pneumothorax.  Ventilation: There is clumping of inhaled radiotracer about the bilateral pulmonary hila, right greater than left, similar to prior V/Q scan. There is grossly unchanged hypoventilation of the left mid and lower lungs with mottled undulation of the left upper lung. There is preferential ventilation of the right mid and lower lung. There is an expected photopenic defect at the location of the right anterior chest wall pacemaker power pack. Ingested radiotracer is seen within the hypopharynx distal esophagus and stomach.  Perfusion: There is matched hypoperfusion involving in the left mid and lower lung with matched mottled perfusion involving the remainder of the left lung. There is an expected photopenia defect at the location of the patient's known right anterior chest wall power pack. Otherwise, there is relative homogeneous perfusion of the right lung parenchyma.  IMPRESSION: 1. Pulmonary embolism absent (low probability for pulmonary embolism) - grossly unchanged from prior V/Q scan performed 02/2012. 2. Persistent volume loss involving the left  mid and lower lung with associated matched decreased ventilation and perfusion.   Electronically Signed   By: Simonne Come M.D.   On: 05/17/2013 16:45   Medications: I have reviewed the patient's current medications. Scheduled Meds: . albuterol  5 mg Nebulization Once  . azithromycin  250 mg Oral Daily  . buPROPion  75 mg Oral BID  . dronabinol  2.5 mg Oral BID AC  . dronedarone  400 mg Oral BID WC  . feeding supplement (RESOURCE BREEZE)  1 Container Oral BID BM  . guaiFENesin  600 mg Oral BID  . ipratropium-albuterol  3 mL Nebulization TID  . lisinopril  5 mg Oral Daily  . [START ON 05/19/2013] metoprolol succinate  25 mg Oral Daily  . pantoprazole  40 mg Oral Daily  . predniSONE  60 mg Oral Q breakfast  . sodium chloride  3 mL Intravenous Q12H  . sodium chloride  3 mL Intravenous Q12H  . warfarin  12.5 mg Oral ONCE-1800  . Warfarin - Pharmacist Dosing Inpatient   Does not apply q1800   Continuous Infusions:  PRN Meds:.acetaminophen, acetaminophen, hydrocortisone cream, oxyCODONE-acetaminophen Assessment/Plan: Principal Problem:   COPD exacerbation Active Problems:  TOBACCO ABUSE   HYPERTENSION   PAF (paroxysmal atrial fibrillation) on chronic coumadin   COPD (chronic obstructive pulmonary disease)   WEIGHT LOSS   Long-term (current) use of anticoagulants   Chronic combined systolic and diastolic congestive heart failure   Lightheadedness   SOB (shortness of breath)   Non-ischemic cardiomyopathy   Unstable angina   Severe malnutrition  Assessment: 67 y.o. man with past medical history of for atrial fibrillation on metoprolol, dronedarone and noncompliance with coumadin therapy with prior compliance issues (last INR 2.1 on 04/12/2013), hypertension, CKD, COPD, tachy-brady syndrome with pacemaker, combined CHF with nonischemic cardiomyopathy EF 25-30% who presented on 12/29 with dyspnea, cough, change in sputum production and consistency for the past month and found to have  COPD exacerbation.   Plan:   COPD exacerbation - improved clinically, still with ronchi and mild wheezing with normal oxygen saturation on room air  -PO 60 mg prednisone Day 2/5  -Breathing treamtent Q4hr  -PO azithromycin 250 mg Day 2/5  -Mucinex 600 mg BID  -Pulse oximetry monitoring and oxygen therapy as needed to keep SpO2> 92%  -Encourage tobacco cessation -Consider outpatient PFT   Unstable Angina - chest pain free -Cycle troponins --> negative -Repeat 12-lead EKG --> T-wave changes improved  -2D-Echo pending   -D/C IV heparin  -Appreciate cardiology consult --> favor repolarization changes on 12-lead EKG rather than ACS   QT prolongation - QTc 487  -Avoid QT prolonging medications (including zofran)  Lightheadedness - improving. Pt with history of tachy-brady syndrome with pacemaker. Pt with episodes of possible arrythmia vs bradycardia (HR 58 in ED) in setting of pre-syncope/syncope.  -Orthostatic vitals negative  -Interrogate pacemaker --> Normal pacemaker function,programmed VVI 50 and turned hysteresis on -Appreciate cardiology consult --> decrease metoprolol from 50 to 25mg  daily -PT/OT consult ---> home health PT   Weight loss - Pt reports unintentional 50 lb weight loss in last year with normal colonoscopy yin 10/2010, with repeat in 10 years. 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 Also with small stable pulmonary nodules on CT chest on 09/2012. Unclear etiology with ongoing work-up being performed as outpatient.  -Nutrition consult  -Feeding supplements -Marinol 2.5 BID for 4 weeks, pt counseled to no take THC concomitantly   Paroxysmal Atrial Fibrillation - Pt on rate control with metoprolol, rhythm control with dronedarone, and AC with history of noncompliance with coumadin with subtherapeutic INR 1.07  -Metoprolol succinate 50 to 25mg  daily  -Continue dronedarone 400 mg BID  -Continue coumadin  per pharmacy  -Continue to monitor INR  Combined Systolic and Diastolic CHF - Pt with EF of 25-30% on 2D-echo with moderate LVH, and grade 1 diastolic dysfunction on 11/2012 with pro-BNP of 6363 on admission without signs of volume overload on exam or CXR .  -Obtain 2D-echo  -Appreciate cardiology recs   Hypertension - currently normotensive  -Continue lisinopril 5 mg daily  -Decrease metoprolol succinate 50 mg to 25 mg daily   CKD Stage 3 - Pt with baseline Cr 2.0 who presented with Cr of 1.74 on admission.  -Avoid nephrotoxins  -Continue to monitor renal function   Rash - pruritic on right lower Abd quadrant and L post thoracic area for 2 months of unknown etiology  -Apply hydrocortisone cream BID  PRN -Monitor outpatient for possible skin biopsy if does not resolve  Code: Full  Diet: Regular  DVT PPx:    Dispo: Disposition is deferred at this time, awaiting  improvement of current medical problems.  Anticipated discharge in approximately 1 day(s).   The patient does have a current PCP [name], MD) and does need an [facility] hospital follow-up appointment after discharge.  The patient does not have transportation limitations that hinder transportation to clinic appointments.  .Services Needed at time of discharge: Y = Yes, Blank = No PT:   OT:   RN:   Equipment:   Other:     LOS: 1 day   Otis Brace, MD 05/18/2013, 4:12 PM

## 2013-05-18 NOTE — Progress Notes (Addendum)
SUBJECTIVE: The patient is doing well today.  He feels like his breathing is improved this morning.   [name] albuterol  2.5 mg Nebulization TID  . albuterol  5 mg Nebulization Once  . azithromycin  250 mg Oral Daily  . buPROPion  75 mg Oral BID  . dronedarone  400 mg Oral BID WC  . guaiFENesin  600 mg Oral BID  . ipratropium  0.5 mg Nebulization TID  . lisinopril  5 mg Oral Daily  . metoprolol succinate  50 mg Oral Daily  . pantoprazole  40 mg Oral Daily  . predniSONE  60 mg Oral Q breakfast  . sodium chloride  3 mL Intravenous Q12H  . sodium chloride  3 mL Intravenous Q12H  . warfarin  10 mg Oral Once  . Warfarin - Pharmacist Dosing Inpatient   Does not apply q1800   . heparin 950 Units/hr (05/17/13 2123)    OBJECTIVE: Physical Exam: Filed Vitals:   05/17/13 1755 05/17/13 1956 05/17/13 2217 05/18/13 0526  BP: 147/106  126/77 133/74  Pulse: 69  60 58  Temp:   98.5 F (36.9 C) 97.7 F (36.5 C)  TempSrc:   Oral Oral  Resp:   18 18  Height:      Weight:    138 lb 14.4 oz (63.005 kg)  SpO2:  99% 98% 100%    Intake/Output Summary (Last 24 hours) at 05/18/13 0655 Last data filed at 05/18/13 0606  Gross per 24 hour  Intake 802.81 ml  Output    250 ml  Net 552.81 ml    Telemetry reveals sinus rhythm with intermittent ventricular fusion pacing  GEN- The patient is well appearing, alert and oriented x 3 today.   Head- normocephalic, atraumatic Eyes-  Sclera clear, conjunctiva pink Ears- hearing intact Oropharynx- clear Neck- supple, no JVP Lymph- no cervical lymphadenopathy Lungs- Clear to ausculation bilaterally, normal work of breathing Heart- Regular rate and rhythm, no murmurs, rubs or gallops, PMI not laterally displaced GI- soft, NT, ND, + BS Extremities- no clubbing, cyanosis, or edema Skin- no rash or lesion Psych- euthymic mood, full affect Neuro- strength and sensation are intact  LABS: Basic Metabolic Panel:  Recent Labs  16/10/96 1225  NA 143  K  4.0  CL 107  CO2 25  GLUCOSE 71  BUN 18  CREATININE 1.74*  CALCIUM 9.2   CBC:  Recent Labs  05/17/13 1225  WBC 7.5  HGB 15.3  HCT 46.5  MCV 90.1  PLT 133*   Cardiac Enzymes:  Recent Labs  05/17/13 1945 05/18/13 0030  TROPONINI <0.30 <0.30    RADIOLOGY: Dg Chest 2 View 05/17/2013   CLINICAL DATA:  Dizziness and shortness of breath.  EXAM: CHEST  2 VIEW  COMPARISON:  03/17/2012  FINDINGS: Dual lead pacemaker remains in place. The right lung again shows chronic hyperinflation. Nipple shadow present on the right. The left lung shows chronic pleural and parenchymal scarring, not significantly changed. No evidence of active pneumonia, heart failure, effusion or collapse.  IMPRESSION: Chronic lung disease with hyperinflation on the right and extensive pleural parenchymal scarring on the left. No change compared previous studies.   Electronically Signed   By: Paulina Fusi M.D.   On: 05/17/2013 13:03   Nm Pulmonary Perf And Vent 05/17/2013   CLINICAL DATA:  History of DVT and COPD, now with shortness of breath for 2 weeks, evaluate for pulmonary embolism  EXAM: NUCLEAR MEDICINE VENTILATION - PERFUSION LUNG SCAN  TECHNIQUE: Ventilation  images were obtained in multiple projections using inhaled aerosol technetium 99 M DTPA. Perfusion images were obtained in multiple projections after intravenous injection of Tc-4m MAA.  COMPARISON:  Chest radiograph - 05/17/2013; 08/14/2011; chest CT -03/18/2012; VQ scan - 03/18/2012  RADIOPHARMACEUTICALS:  40 mCi Tc-21m DTPA aerosol and 6 mCi Tc-59m MAA  FINDINGS: Review of chest radiograph performed earlier same day demonstrates grossly unchanged borderline enlarged cardiac silhouette and mediastinal contours. A pacer power pack overlies the peripheral aspect of the right upper/mid lung. The lungs remain hyperexpanded. There is grossly unchanged architectural distortion and volume loss primarily involving the left mid lung. Grossly unchanged elevation of the  left hemidiaphragm and left apical asymmetric pleural parenchymal thickening. Sequela of prior talc pleurodesis involving the left lung is not well demonstrated for was seen on remote chest CT. No definite pleural effusion or pneumothorax.  Ventilation: There is clumping of inhaled radiotracer about the bilateral pulmonary hila, right greater than left, similar to prior V/Q scan. There is grossly unchanged hypoventilation of the left mid and lower lungs with mottled undulation of the left upper lung. There is preferential ventilation of the right mid and lower lung. There is an expected photopenic defect at the location of the right anterior chest wall pacemaker power pack. Ingested radiotracer is seen within the hypopharynx distal esophagus and stomach.  Perfusion: There is matched hypoperfusion involving in the left mid and lower lung with matched mottled perfusion involving the remainder of the left lung. There is an expected photopenia defect at the location of the patient's known right anterior chest wall power pack. Otherwise, there is relative homogeneous perfusion of the right lung parenchyma.  IMPRESSION: 1. Pulmonary embolism absent (low probability for pulmonary embolism) - grossly unchanged from prior V/Q scan performed 02/2012. 2. Persistent volume loss involving the left mid and lower lung with associated matched decreased ventilation and perfusion.   Electronically Signed   By: Simonne Come M.D.   On: 05/17/2013 16:45    ASSESSMENT AND PLAN:  Principal Problem:   COPD exacerbation Active Problems:   TOBACCO ABUSE   HYPERTENSION   PAF (paroxysmal atrial fibrillation) on chronic coumadin   COPD (chronic obstructive pulmonary disease)   WEIGHT LOSS   Long-term (current) use of anticoagulants   Chronic combined systolic and diastolic congestive heart failure   Lightheadedness   SOB (shortness of breath)   Non-ischemic cardiomyopathy   Unstable angina  1. afib Maintaining sinus with  multaq.  He has tried multiple AADs in the past and this has been the only effective one.  We have elected to continue multaq despite reduced EF. Resume coumadin, continue IV heparin  2. Chronic systolic dysfunction Appears compensated.  I do not believe that he is fluid overloaded presently. Keep Is and Os about even  3. Sinus bradycardia Normal pacemaker function Programmed VVI 50.  I have turned hysteresis on today Decrease toprol to 25mg  daily.  4. Tobacco Cessation advised  No further inpatient CV eval planned Continue current management Will follow from afar Call with questions

## 2013-05-18 NOTE — Progress Notes (Addendum)
ANTICOAGULATION CONSULT NOTE  Pharmacy Consult for Heparin and Coumadin Indication: atrial fibrillation  Allergies  Allergen Reactions  . Digoxin And Related     Dig toxicity 2009  . Penicillins Rash    Patient Measurements: Height: 6\' 3"  (190.5 cm) Weight: 138 lb 14.4 oz (63.005 kg) (Scale C) IBW/kg (Calculated) : 84.5 Heparin Dosing Weight:   Vital Signs: Temp: 98.1 F (36.7 C) (12/30 0900) Temp src: Oral (12/30 0900) BP: 142/66 mmHg (12/30 0900) Pulse Rate: 64 (12/30 0900)  Labs:  Recent Labs  05/17/13 1225 05/17/13 1945 05/18/13 0030 05/18/13 0910  HGB 15.3  --   --  14.0  HCT 46.5  --   --  41.5  PLT 133*  --   --  128*  LABPROT 13.7  --   --  13.7  INR 1.07  --   --  1.07  HEPARINUNFRC  --   --   --  0.35  CREATININE 1.74*  --   --  1.59*  TROPONINI  --  <0.30 <0.30  --     Estimated Creatinine Clearance: 39.1 ml/min (by C-G formula based on Cr of 1.59).  Medications:  Scheduled:  . albuterol  2.5 mg Nebulization TID  . albuterol  5 mg Nebulization Once  . azithromycin  250 mg Oral Daily  . buPROPion  75 mg Oral BID  . dronedarone  400 mg Oral BID WC  . guaiFENesin  600 mg Oral BID  . ipratropium  0.5 mg Nebulization TID  . lisinopril  5 mg Oral Daily  . [START ON 05/19/2013] metoprolol succinate  25 mg Oral Daily  . pantoprazole  40 mg Oral Daily  . predniSONE  60 mg Oral Q breakfast  . sodium chloride  3 mL Intravenous Q12H  . sodium chloride  3 mL Intravenous Q12H  . warfarin  10 mg Oral Once  . Warfarin - Pharmacist Dosing Inpatient   Does not apply q1800    Assessment: 67 yr old male was seen at his PCP office with SOB, cough, chest pressure. He was referred to the ED for suspicion of PE. Admit INR was low due to pt running out of warfarin and missing doses. Home dose of coumadin is 10 mg daily except 7.5 mg on Wednesdays and Fridays. PVQ scan was negative for PE. To continue heparin until INR therapeutic. INR 1.07 this morning. Appears  warfarin was ordered, but never appeared on the [facility] for RN to give- warfarin NOT administered last evening. Heparin level 0.35unit/mL.  Goal of Therapy:  INR 2-3 Heparin level 0.3-0.7 units/ml Monitor platelets by anticoagulation protocol: Yes   Plan:  1. Warfarin 12.5mg  po x1 tonight 2. Continue heparin drip at 950 units/hr 3. Confirmatory heparin level at 1800  4. Daily AM heparin level, CBC and PT/INR 5. Follow for s/s bleeding  Nayel Purdy D. Joell Buerger, PharmD, BCPS Clinical Pharmacist Pager: [phone] 05/18/2013 11:12 AM  ADDENDUM Spoke with IM team- heparin gtt has been stopped as patient does not have active clot. Likely to be sent home tomorrow. He is a patient of Dr. Chancy Milroy at anticoagulation clinic. Patient had missed a few doses of warfarin PTA and INR is normalized at 1.07 now. Will need increased dose and quick follow up.  Plan: 1. Recommend warfarin 12.5mg  (2.5 tablets as patient has 5mg  tablets at home) daily with an INR check 1 week after discharge 2. Will continue to follow while patient is hospitalized  Caliann Leckrone D. Naomii Kreger, PharmD, BCPS Clinical Pharmacist Pager:  [phone] 05/18/2013 1:24 PM

## 2013-05-18 NOTE — Progress Notes (Signed)
Patient refused AM lab draw.  Phlebotomy to come redraw at 0800.  RN will continue to monitor. Louretta Parma, RN

## 2013-05-19 DIAGNOSIS — Z7901 Long term (current) use of anticoagulants: Secondary | ICD-10-CM

## 2013-05-19 LAB — BASIC METABOLIC PANEL
BUN: 28 mg/dL — ABNORMAL HIGH (ref 6–23)
Chloride: 110 mEq/L (ref 96–112)
Creatinine, Ser: 1.67 mg/dL — ABNORMAL HIGH (ref 0.50–1.35)
GFR calc Af Amer: 47 mL/min — ABNORMAL LOW (ref >90)
Potassium: 5.1 mEq/L (ref 3.7–5.3)
Sodium: 146 mEq/L (ref 137–147)

## 2013-05-19 LAB — CBC
MCV: 92.1 fL (ref 78.0–100.0)
Platelets: 126 10*3/uL — ABNORMAL LOW (ref 150–400)
RBC: 4.16 MIL/uL — ABNORMAL LOW (ref 4.22–5.81)
RDW: 15 % (ref 11.5–15.5)
WBC: 11 10*3/uL — ABNORMAL HIGH (ref 4.0–10.5)

## 2013-05-19 LAB — PROTIME-INR
INR: 0.88 (ref 0.00–1.49)
Prothrombin Time: 11.8 s (ref 11.6–15.2)

## 2013-05-19 LAB — GLUCOSE, CAPILLARY: Glucose-Capillary: 97 mg/dL (ref 70–99)

## 2013-05-19 MED ORDER — AZITHROMYCIN 250 MG PO TABS: ORAL_TABLET | ORAL | Status: DC

## 2013-05-19 MED ORDER — METOPROLOL SUCCINATE ER 25 MG PO TB24: 25.0000 mg | ORAL_TABLET | Freq: Every day | ORAL | Status: DC

## 2013-05-19 MED ORDER — WARFARIN SODIUM 5 MG PO TABS: 12.5000 mg | ORAL_TABLET | Freq: Every day | ORAL | Status: DC

## 2013-05-19 MED ORDER — TIOTROPIUM BROMIDE MONOHYDRATE 18 MCG IN CAPS: 18.0000 ug | ORAL_CAPSULE | Freq: Every day | RESPIRATORY_TRACT | Status: DC

## 2013-05-19 MED ORDER — ALUM & MAG HYDROXIDE-SIMETH 200-200-20 MG/5ML PO SUSP: 15.0000 mL | Freq: Four times a day (QID) | ORAL | Status: DC | PRN

## 2013-05-19 MED ORDER — WARFARIN SODIUM 7.5 MG PO TABS
15.0000 mg | ORAL_TABLET | Freq: Once | ORAL | Status: DC
  Filled 2013-05-19: qty 2

## 2013-05-19 MED ORDER — PREDNISONE 20 MG PO TABS: 60.0000 mg | ORAL_TABLET | Freq: Every day | ORAL | Status: DC

## 2013-05-19 MED ORDER — ALUM & MAG HYDROXIDE-SIMETH 200-200-20 MG/5ML PO SUSP
15.0000 mL | Freq: Once | ORAL | Status: AC
  Administered 2013-05-19: 07:00:00 15 mL via ORAL
  Filled 2013-05-19: qty 30

## 2013-05-19 MED ORDER — DRONABINOL 2.5 MG PO CAPS: 2.5000 mg | ORAL_CAPSULE | Freq: Two times a day (BID) | ORAL | Status: DC

## 2013-05-19 NOTE — Discharge Summary (Signed)
Name: Travis LAUDER Sr. MRN: [id number] DOB: 10/10/1945 67 y.o. PCP: [name], MD  Date of Admission: 05/17/2013  1:56 PM Date of Discharge: 05/19/2013 Attending Physician: Blanch Media, MD  Discharge Diagnosis: 1. Principal Problem:   COPD exacerbation Active Problems:   TOBACCO ABUSE   HYPERTENSION   PAF (paroxysmal atrial fibrillation) on chronic coumadin   COPD (chronic obstructive pulmonary disease)   WEIGHT LOSS   Long-term (current) use of anticoagulants   Chronic combined systolic and diastolic congestive heart failure   Lightheadedness   SOB (shortness of breath)   Non-ischemic cardiomyopathy   Unstable angina   Severe malnutrition  Discharge Medications:   Medication List         albuterol 108 (90 BASE) MCG/ACT inhaler  Commonly known as:  PROVENTIL HFA;VENTOLIN HFA  Inhale 2 puffs into the lungs every 6 (six) hours as needed for wheezing or shortness of breath.     azithromycin 250 MG tablet  Commonly known as:  ZITHROMAX  Take 1 tab  Start taking on:  05/20/2013     buPROPion 75 MG tablet  Commonly known as:  WELLBUTRIN  Take 1 tablet (75 mg total) by mouth 2 (two) times daily.     dronabinol 2.5 MG capsule  Commonly known as:  MARINOL  Take 1 capsule (2.5 mg total) by mouth 2 (two) times daily before lunch and supper.     lisinopril 5 MG tablet  Commonly known as:  PRINIVIL,ZESTRIL  Take 1 tablet (5 mg total) by mouth daily.     metoprolol succinate 25 MG 24 hr tablet  Commonly known as:  TOPROL XL  Take 1 tablet (25 mg total) by mouth daily.     MULTAQ 400 MG tablet  Generic drug:  dronedarone  TAKE 1 TABLET BY MOUTH TWICE A DAY WITH A MEAL     omeprazole 20 MG capsule  Commonly known as:  PRILOSEC  TAKE 2 CAPSULES (40 MG TOTAL) BY MOUTH DAILY.     oxyCODONE-acetaminophen 10-325 MG per tablet  Commonly known as:  PERCOCET  Take 1 tablet by mouth every 6 (six) hours as needed for pain.     predniSONE 20 MG tablet    Commonly known as:  DELTASONE  Take 3 tablets (60 mg total) by mouth daily with breakfast.  Start taking on:  05/20/2013     tiotropium 18 MCG inhalation capsule  Commonly known as:  SPIRIVA  Place 1 capsule (18 mcg total) into inhaler and inhale daily.     warfarin 5 MG tablet  Commonly known as:  COUMADIN  Take 2.5 tablets (12.5 mg total) by mouth daily.  Start taking on:  05/20/2013        Disposition and follow-up:   [name] was discharged from [facility] in Good condition.  At the hospital follow up visit please address:  1. Resolution of COPD exacerbation, consider repeat PFTs (last in 2012)     Oxygen requirement      Resolution of symptomatic bradycardia (lightheadedness with ambulation) & HR sp BB adjustment       Weight gain s/p marinol, make sure not taking recreational marijuana concomitantly       Etiology of chronic neuropathy in b/l feet and if medical therapy warranted        Etiology of rash and if dermatology referral for skin biopsy warranted   2.  Labs / imaging needed at time of follow-up: INR on 05/24/13,  CBC (resolution of normocytic anemia)  3.  Pending labs/ test needing follow-up: none  Follow-up Appointments:     Follow-up Information   Follow up with [name], MD On 05/26/2013. (@1015am )    Specialty:  Internal Medicine   Contact information:   [address] [place] [address] [phone]       Follow up with Gar Ponto, [facility] On 05/24/2013. (@9am )    Specialty:  Pharmacist   Contact information:   [address] [place] [address] [phone]       Discharge Instructions: Discharge Orders   Future Appointments Provider Department Dept Phone   05/24/2013 9:00 AM [facility] [phone]   05/26/2013 10:15 AM [name], MD [name] [phone]   Future Orders Complete By Expires   Increase activity  slowly  As directed       Consultations:  cardiology  Procedures Performed:  Dg Chest 2 View  05/17/2013   CLINICAL DATA:  Dizziness and shortness of breath.  EXAM: CHEST  2 VIEW  COMPARISON:  03/17/2012  FINDINGS: Dual lead pacemaker remains in place. The right lung again shows chronic hyperinflation. Nipple shadow present on the right. The left lung shows chronic pleural and parenchymal scarring, not significantly changed. No evidence of active pneumonia, heart failure, effusion or collapse.  IMPRESSION: Chronic lung disease with hyperinflation on the right and extensive pleural parenchymal scarring on the left. No change compared previous studies.   Electronically Signed   By: Paulina Fusi M.D.   On: 05/17/2013 13:03   Nm Pulmonary Perf And Vent  05/17/2013   CLINICAL DATA:  History of DVT and COPD, now with shortness of breath for 2 weeks, evaluate for pulmonary embolism  EXAM: NUCLEAR MEDICINE VENTILATION - PERFUSION LUNG SCAN  TECHNIQUE: Ventilation images were obtained in multiple projections using inhaled aerosol technetium 99 M DTPA. Perfusion images were obtained in multiple projections after intravenous injection of Tc-54m MAA.  COMPARISON:  Chest radiograph - 05/17/2013; 08/14/2011; chest CT -03/18/2012; VQ scan - 03/18/2012  RADIOPHARMACEUTICALS:  40 mCi Tc-28m DTPA aerosol and 6 mCi Tc-42m MAA  FINDINGS: Review of chest radiograph performed earlier same day demonstrates grossly unchanged borderline enlarged cardiac silhouette and mediastinal contours. A pacer power pack overlies the peripheral aspect of the right upper/mid lung. The lungs remain hyperexpanded. There is grossly unchanged architectural distortion and volume loss primarily involving the left mid lung. Grossly unchanged elevation of the left hemidiaphragm and left apical asymmetric pleural parenchymal thickening. Sequela of prior talc pleurodesis involving the left lung is not well demonstrated for was seen on remote chest CT.  No definite pleural effusion or pneumothorax.  Ventilation: There is clumping of inhaled radiotracer about the bilateral pulmonary hila, right greater than left, similar to prior V/Q scan. There is grossly unchanged hypoventilation of the left mid and lower lungs with mottled undulation of the left upper lung. There is preferential ventilation of the right mid and lower lung. There is an expected photopenic defect at the location of the right anterior chest wall pacemaker power pack. Ingested radiotracer is seen within the hypopharynx distal esophagus and stomach.  Perfusion: There is matched hypoperfusion involving in the left mid and lower lung with matched mottled perfusion involving the remainder of the left lung. There is an expected photopenia defect at the location of the patient's known right anterior chest wall power pack. Otherwise, there is relative homogeneous perfusion of the  right lung parenchyma.  IMPRESSION: 1. Pulmonary embolism absent (low probability for pulmonary embolism) - grossly unchanged from prior V/Q scan performed 02/2012. 2. Persistent volume loss involving the left mid and lower lung with associated matched decreased ventilation and perfusion.   Electronically Signed   By: Simonne Come M.D.   On: 05/17/2013 16:45    2D Echo: 05/18/13  Study Conclusions  - Procedure narrative: Transthoracic echocardiography. Excellent image acquisition! - Left ventricle: The cavity size was normal. There was mild concentric hypertrophy. Systolic function was normal. The estimated ejection fraction was in the range of 55% to 60%. Wall motion was normal; there were no regional wall motion abnormalities. LV diastolic function cannot be assessed due to a-fib. The E/e' ratio, however, is >10, suggesting elevated LV filling pressure. - Aortic valve: Trileaflet. End-diastolic prolapse of the valve leaflets. Mild central regurgitation. - Aorta: Moderately dilated ascending aorta, measuring up  to 4.6 cm in the visualized portion. - Mitral valve: Calcified annulus. Mildly thickened leaflets . Mild regurgitation. - Left atrium: Severly dilated (49 ml/m2). - Right atrium: The atrium was mildly dilated. Pacer wire or catheter noted in right atrium. - Atrial septum: No defect or patent foramen ovale was identified. - Tricuspid valve: Late diastolic leaflet prolapse is noted. Moderate regurgitation. Peak gradient: 42mm Hg (D). - Systemic veins: The IVC was not visualized. - Pericardium, extracardiac: There was no pericardial effusion.  Cardiac Cath: none  Admission HPI:   [name] is a 67 y.o. man with past medical history of for atrial fibrillation on metoprolol, dronedarone and noncompliance with coumadin therapy with prior compliance issues (last INR 2.1 on 04/12/2013), hypertension, CKD, COPD, tachy-brady syndrome with pacemaker, diastolic CHF with nonischemic cardiomyopathy EF 25-30% who presents with shortness of breath and productive cough of thick phlegm for the past month with cough worse in the past week. Pt reports 1 month ago he began having shortness of breath that is worse with activity.   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 He reports no similar symptoms in the past. Since then he has continued to have lightheadedness from sitting to standing with no symptoms of vertigo. He has also had intermittent left sided CP that can occur with activity and rest. He reports having chronic fatigue, worse in the past week such that he has been unable to work and lying in bed. He reports unintentional 50 lb weight loss in last year.   He reports last COPD exacerbation was about 1 year ago and no recent antibiotics or corticosteroids. He reports having to use his albuterol rescue inhaler 2-3x daily in the past month, whereas previously he was not using  it hardly at all. He reports wheezing. He reports using marijuana last week and smoking 3-4 cigarettes a day.   He reports having DVT and arterial thrombosis in the past and is on chronic AC with coumadin (also for PAF) which he was not been taking since running out of 2 days ago. Today he was seen in the [facility] and found to have subtherapeutic INR of 1.1 with symptoms concerning for PE. He denies lower extremity swelling. Pt received VQ scan in ED that was negative for pulmonary embolus.    Hospital Course by problem list: Principal Problem:   COPD exacerbation Active Problems:   TOBACCO ABUSE   HYPERTENSION   PAF (paroxysmal atrial fibrillation) on  chronic coumadin   COPD (chronic obstructive pulmonary disease)   WEIGHT LOSS   Long-term (current) use of anticoagulants   Chronic combined systolic and diastolic congestive heart failure   Lightheadedness   SOB (shortness of breath)   Non-ischemic cardiomyopathy   Unstable angina   Severe malnutrition   Moderate COPD exacerbation - Pt presented with wheezing, dyspnea, productive cough with increased sputum production and change in consistency without fever, leukocytosis, or CXR findings concerning for infectious etiology. CTA chest on 12/29 revealed low probability for PE.  Pt with normal oxygen saturation on room air on admission with hypoxia with ambulation (83%) on day of discharge requiring home oxygen. Pt mproved clinically during hospitalization with corticosteroids, breathing treatment, and antibiotics. He still continued to have ronchi and mild wheezing most likely due to chronic and uncontrolled COPD at time of discharge. Pt was instructed to finish PO 60 mg prednisone course (2 additional days) for total 5 day course and PO azithromycin course (250 mg x 2 days) for total 5 day course. Pt was also instructed to continue daily maintenance therapy with tiotropium which was refilled as he had ran out of it and albuterol rescue inhaler  therapy on a as needed basis. Pt was also encouraged to abstain from tobacco use. Pt to receive repeat PFT as outpatient to assess pulmonary function.      Lightheadedness in setting of tachy-brady syndrome  - Pt with significant lighteadedness with ambulation for 2 weeks and recent presyncope/syncope 1 week prior to admission. Pt with history of tachy-brady syndrome with pacemaker. Pt with mild bradycardia (HR 58) on admission with HR 57-71 during hospitalizaiton. Orthostatic vitals were negative. His pacemaker was interrogated by cardiology which revealed normal pacemaker function, however settings were adjusted (programmed VVI 50 and turned hysteresis on). Pt's beta blocker was also adjusted by cardiology, from metoprolol succinatel 50 mg to 25mg  daily which was to be continued on discharge. Pt's lightheadedness significantly improved during hospitalization and he was able to ambulate with PT without difficulty. Pt was provided with home oxygen therapy due to oxygen desaturation on room air with ambulation.     Unstable Angina - Pt remained chest pain free during hospitalization with negative CE's and improvement of T-wave changes on initial 12-lead EKG. 2D-Echo on 12/30 revealed normal systolic function. Pt initially received IV heparin for 1 day due to concern for ACS. Per cardiology consult, no further inpatient work-up was necessary and pt to follow with his cardiologist, Dr. Johney Frame.   Weight loss - Pt reported unintentional 50 lb weight loss in last year with normal colonoscopy in 10/2010, with repeat in 10 years. 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 Also with small stable pulmonary nodules on CT chest on 09/2012. Unclear etiology with ongoing work-up being performed as outpatient. Patient with BMI of 17.36 indicating underweight and meeting severe malnutrition criteria (14% weight loss in 4 months). Pt  received nutritional feeding supplements during hospitalization without weight gain. Pt reported increased appetite with prednisone. In order to increase appetite, pt instructed to take marinol 2.5 mg BID trial for 4 weeks and counseled against any other form of concomitant THC use. Pt's PCP agreed with 4-week appetite stimulant therapy. Pt also instructed to continue high calorie and protein diet at home.     Paroxysmal Atrial Fibrillation - Pt on rate control with metoprolol, rhythm control with dronedarone, and AC with history of noncompliance with coumadin and subtherapeutic INR  1.07 on admission and on discharge (0.88). Pt was reported to miss last few days of coumadin therapy. Pt remained in normal sinus rhythm during hospitalization. Pt received CTA chest on 12/29 and found to be negative for PE. Per cardiology, pt's metoprolol succinate was adjusted from 50 to 25 mg daily. Pt to continue dronedarone 400 mg BID. Per pharmacy pt to take 12.5mg  daily of coumadin until INR check on 05/24/13 at coumadin clinic.    Combined Systolic and Diastolic CHF - Pt with EF of 25-30% on 2D-echo with moderate LVH, and grade 1 diastolic dysfunction on 11/2012 with pro-BNP of 6363 on admission without signs of volume overload on exam or CXR. Repeat 2D-echo with improved systolic function EF 55-60%, no wall motion,  and inability to assess diastolic function. Pt remained euvolemic during hospitalization without weight gain or need for diuretic therapy.     Hypertension - Pt with well controlled blood pressure during hospitalization, range 114/73-151/83. Pt was continued on home lisinopril 5 mg daily and metoprolol succinate was adjusted per cardiology from 50 mg to 25 mg daily due to symptomatic bradycardia.    Normocytic Anemia - Pt with normocytic anemia on day of discharge with no active bleeding or hemodynamic instability. Pt to receive repeat CBC at hospital follow-up visit.   Leukocytosis - Pt with leukocytosis on  day of discharge most likely due to corticosteroid use. Infectious etiology less likely considering pt afebrile and on antibiotic therapy.   QT prolongation - Pt with QTc of 487 on 12-lead EKG. Etiology unknown. QT prolonging medications were avoided during hospitalization.   CKD Stage 3 - Pt with baseline Cr 2.0 who presented with Cr of 1.74 on admission, improved to 1.67 on discharge. Pt's renal function was monitored during hospitalization and nephrotoxins were held.    Rash - Pt with pruritic right lower abdominal quadrant and  thoracic area for 2 months of unknown etiology. Pt was instructed to apply hydrocortisone cream twice a day as needed to area as previously prescribed. Pt to have outpatient follow-up with possible dermatology referral for skin biopsy if warranted.   Chronic pain - Pt on pain contract with PCP. He received home narcotic therapy during hospitalization and provided with monthly pain contract prescription (signed by PCP) to be filled on 05/19/13.    Chronic Neuropathy- Pt with chronic neuropathy in b/l feet of unknown etiology possibly affecting gait stability. Pt to have outpatient work-up and possible initiation of therapy.    Discharge Vitals:   BP 151/83  Pulse 63  Temp(Src) 97.8 F (36.6 C) (Oral)  Resp 18  Ht 6\' 3"  (1.905 m)  Wt 63.005 kg (138 lb 14.4 oz)  BMI 17.36 kg/m2  SpO2 99%  Discharge Labs:  Results for orders placed during the hospital encounter of 05/17/13 (from the past 24 hour(s))  PROTIME-INR     Status: None   Collection Time    05/19/13  5:30 AM      Result Value Range   Prothrombin Time 11.8  11.6 - 15.2 seconds   INR 0.88  0.00 - 1.49  CBC     Status: Abnormal   Collection Time    05/19/13  5:30 AM      Result Value Range   WBC 11.0 (*) 4.0 - 10.5 K/uL   RBC 4.16 (*) 4.22 - 5.81 MIL/uL   Hemoglobin 12.9 (*) 13.0 - 17.0 g/dL   HCT 16.1 (*) 09.6 - 04.5 %   MCV 92.1  78.0 - 100.0  fL   MCH 31.0  26.0 - 34.0 pg   MCHC 33.7  30.0 -  36.0 g/dL   RDW 16.1  09.6 - 04.5 %   Platelets 126 (*) 150 - 400 K/uL  BASIC METABOLIC PANEL     Status: Abnormal   Collection Time    05/19/13  5:30 AM      Result Value Range   Sodium 146  137 - 147 mEq/L   Potassium 5.1  3.7 - 5.3 mEq/L   Chloride 110  96 - 112 mEq/L   CO2 25  19 - 32 mEq/L   Glucose, Bld 85  70 - 99 mg/dL   BUN 28 (*) 6 - 23 mg/dL   Creatinine, Ser 4.09 (*) 0.50 - 1.35 mg/dL   Calcium 8.8  8.4 - 81.1 mg/dL   GFR calc non Af Amer 40 (*) >90 mL/min   GFR calc Af Amer 47 (*) >90 mL/min  GLUCOSE, CAPILLARY     Status: None   Collection Time    05/19/13  6:36 AM      Result Value Range   Glucose-Capillary 97  70 - 99 mg/dL    Signed: Otis Brace, MD 05/19/2013, 4:36 PM   Time Spent on Discharge: 60 minutes Services Ordered on Discharge: Home Health PT & RN Equipment Ordered on Discharge:  Home oxygen

## 2013-05-19 NOTE — Progress Notes (Signed)
ANTICOAGULATION CONSULT NOTE  Pharmacy Consult for warfarin Indication: atrial fibrillation  Allergies  Allergen Reactions  . Digoxin And Related     Dig toxicity 2009  . Penicillins Rash    Patient Measurements: Height: 6\' 3"  (190.5 cm) Weight: 138 lb 14.4 oz (63.005 kg) (Scale C) IBW/kg (Calculated) : 84.5  Vital Signs: Temp: 98.7 F (37.1 C) (12/31 0609) Temp src: Oral (12/31 0609) BP: 142/82 mmHg (12/31 0609) Pulse Rate: 61 (12/31 0609)  Labs:  Recent Labs  05/17/13 1225 05/17/13 1945 05/18/13 0030 05/18/13 0910 05/19/13 0530  HGB 15.3  --   --  14.0 12.9*  HCT 46.5  --   --  41.5 38.3*  PLT 133*  --   --  128* 126*  LABPROT 13.7  --   --  13.7 11.8  INR 1.07  --   --  1.07 0.88  HEPARINUNFRC  --   --   --  0.35  --   CREATININE 1.74*  --   --  1.59* 1.67*  TROPONINI  --  <0.30 <0.30  --   --     Estimated Creatinine Clearance: 37.2 ml/min (by C-G formula based on Cr of 1.67).  Medications:  Scheduled:  . albuterol  5 mg Nebulization Once  . azithromycin  250 mg Oral Daily  . buPROPion  75 mg Oral BID  . dronabinol  2.5 mg Oral BID AC  . dronedarone  400 mg Oral BID WC  . feeding supplement (RESOURCE BREEZE)  1 Container Oral BID BM  . guaiFENesin  600 mg Oral BID  . ipratropium-albuterol  3 mL Nebulization TID  . lisinopril  5 mg Oral Daily  . metoprolol succinate  25 mg Oral Daily  . pantoprazole  40 mg Oral Daily  . predniSONE  60 mg Oral Q breakfast  . sodium chloride  3 mL Intravenous Q12H  . sodium chloride  3 mL Intravenous Q12H  . Warfarin - Pharmacist Dosing Inpatient   Does not apply q1800    Assessment: 67 yr old male was seen at his PCP office with SOB, cough, chest pressure. He was referred to the ED for suspicion of PE. Admit INR was low due to pt running out of warfarin and missing doses. Home dose of coumadin is 10 mg daily except 7.5 mg on Wednesdays and Fridays. PVQ scan was negative for PE. Heparin was stopped yesterday  afternoon by IM teaching service. INR 0.88 this morning. Can attribute to patient missing doses PTA, and then a dose being ordered but never given in the hospital 12/29 (safety zone report was completed).   Goal of Therapy:  INR 2-3 Monitor platelets by anticoagulation protocol: Yes   Plan:  1. Warfarin 15mg  po x1 tonight 2. Daily PT/INR 3. Follow for s/s bleeding 4. Discussed with Dr. Virgina Organ yesterday- if patient is discharged today, recommend warfarin 12.5mg  daily with an INR check in 1 week  Khadim Lundberg D. Gioia Ranes, PharmD, BCPS Clinical Pharmacist Pager: [phone] 05/19/2013 7:36 AM

## 2013-05-19 NOTE — Progress Notes (Addendum)
OT Cancellation Note (late entry)  Patient Details Name: Travis GILLESPIE Sr. MRN: [id number] DOB: 07-28-1945   Cancelled Treatment:    Reason Eval/Treat Not Completed: Patient declined, no reason specified. Pt reports wanting to take a nap. Pt educated on the reason for OT and need to complete evaluation for d/c pending. Pt reports agreeable to OT but not at this date / time.  Harolyn Rutherford Pager: [phone]  05/19/2013, 7:31 AM

## 2013-05-19 NOTE — Progress Notes (Signed)
SUBJECTIVE: The patient is doing well today.  He feels like his breathing is improved this morning. He does have some heart burn.  Echo yesterday demonstrated normalization of EF, no RWMA, moderate TR, LA 49  INR still subtherapeutic  CURRENT MEDICATIONS: . albuterol  5 mg Nebulization Once  . alum & mag hydroxide-simeth  15 mL Oral Once  . azithromycin  250 mg Oral Daily  . buPROPion  75 mg Oral BID  . dronabinol  2.5 mg Oral BID AC  . dronedarone  400 mg Oral BID WC  . feeding supplement (RESOURCE BREEZE)  1 Container Oral BID BM  . guaiFENesin  600 mg Oral BID  . ipratropium-albuterol  3 mL Nebulization TID  . lisinopril  5 mg Oral Daily  . metoprolol succinate  25 mg Oral Daily  . pantoprazole  40 mg Oral Daily  . predniSONE  60 mg Oral Q breakfast  . sodium chloride  3 mL Intravenous Q12H  . sodium chloride  3 mL Intravenous Q12H  . Warfarin - Pharmacist Dosing Inpatient   Does not apply q1800      OBJECTIVE: Physical Exam: Filed Vitals:   05/18/13 1542 05/18/13 1737 05/18/13 2017 05/18/13 2021  BP:  129/76  128/66  Pulse:  66  57  Temp:  97.8 F (36.6 C)  98.7 F (37.1 C)  TempSrc:  Oral  Oral  Resp:  20  18  Height:      Weight:      SpO2: 100% 99% 96% 100%    Intake/Output Summary (Last 24 hours) at 05/19/13 0643 Last data filed at 05/19/13 1610  Gross per 24 hour  Intake 1846.28 ml  Output    570 ml  Net 1276.28 ml    Telemetry reveals sinus rhythm with intermittent ventricular fusion pacing, short run of atrial tach   GEN- The patient is well appearing, alert and oriented x 3 today.   Head- normocephalic, atraumatic Eyes-  Sclera clear, conjunctiva pink Ears- hearing intact Oropharynx- clear Neck- supple, no JVP Lymph- no cervical lymphadenopathy Lungs- Coarse BS throughout, no rales Heart- Regular rate and rhythm, no murmurs, rubs or gallops, PMI not laterally displaced GI- soft, NT, ND, + BS Extremities- no clubbing, cyanosis, or  edema  LABS: Basic Metabolic Panel:  Recent Labs  96/04/54 0910 05/19/13 0530  NA 141 146  K 4.2 5.1  CL 104 110  CO2 17* 25  GLUCOSE 231* 85  BUN 23 28*  CREATININE 1.59* 1.67*  CALCIUM 8.8 8.8   CBC:  Recent Labs  05/18/13 0910 05/19/13 0530  WBC 4.5 11.0*  HGB 14.0 12.9*  HCT 41.5 38.3*  MCV 91.2 92.1  PLT 128* 126*   Cardiac Enzymes:  Recent Labs  05/17/13 1945 05/18/13 0030  TROPONINI <0.30 <0.30    RADIOLOGY: Dg Chest 2 View 05/17/2013   CLINICAL DATA:  Dizziness and shortness of breath.  EXAM: CHEST  2 VIEW  COMPARISON:  03/17/2012  FINDINGS: Dual lead pacemaker remains in place. The right lung again shows chronic hyperinflation. Nipple shadow present on the right. The left lung shows chronic pleural and parenchymal scarring, not significantly changed. No evidence of active pneumonia, heart failure, effusion or collapse.  IMPRESSION: Chronic lung disease with hyperinflation on the right and extensive pleural parenchymal scarring on the left. No change compared previous studies.   Electronically Signed   By: Paulina Fusi M.D.   On: 05/17/2013 13:03   Nm Pulmonary Perf And Vent 05/17/2013  CLINICAL DATA:  History of DVT and COPD, now with shortness of breath for 2 weeks, evaluate for pulmonary embolism  EXAM: NUCLEAR MEDICINE VENTILATION - PERFUSION LUNG SCAN  TECHNIQUE: Ventilation images were obtained in multiple projections using inhaled aerosol technetium 99 M DTPA. Perfusion images were obtained in multiple projections after intravenous injection of Tc-43m MAA.  COMPARISON:  Chest radiograph - 05/17/2013; 08/14/2011; chest CT -03/18/2012; VQ scan - 03/18/2012  RADIOPHARMACEUTICALS:  40 mCi Tc-76m DTPA aerosol and 6 mCi Tc-17m MAA  FINDINGS: Review of chest radiograph performed earlier same day demonstrates grossly unchanged borderline enlarged cardiac silhouette and mediastinal contours. A pacer power pack overlies the peripheral aspect of the right upper/mid  lung. The lungs remain hyperexpanded. There is grossly unchanged architectural distortion and volume loss primarily involving the left mid lung. Grossly unchanged elevation of the left hemidiaphragm and left apical asymmetric pleural parenchymal thickening. Sequela of prior talc pleurodesis involving the left lung is not well demonstrated for was seen on remote chest CT. No definite pleural effusion or pneumothorax.  Ventilation: There is clumping of inhaled radiotracer about the bilateral pulmonary hila, right greater than left, similar to prior V/Q scan. There is grossly unchanged hypoventilation of the left mid and lower lungs with mottled undulation of the left upper lung. There is preferential ventilation of the right mid and lower lung. There is an expected photopenic defect at the location of the right anterior chest wall pacemaker power pack. Ingested radiotracer is seen within the hypopharynx distal esophagus and stomach.  Perfusion: There is matched hypoperfusion involving in the left mid and lower lung with matched mottled perfusion involving the remainder of the left lung. There is an expected photopenia defect at the location of the patient's known right anterior chest wall power pack. Otherwise, there is relative homogeneous perfusion of the right lung parenchyma.  IMPRESSION: 1. Pulmonary embolism absent (low probability for pulmonary embolism) - grossly unchanged from prior V/Q scan performed 02/2012. 2. Persistent volume loss involving the left mid and lower lung with associated matched decreased ventilation and perfusion.   Electronically Signed   By: Simonne Come M.D.   On: 05/17/2013 16:45    ASSESSMENT AND PLAN:  Principal Problem:   COPD exacerbation Active Problems:   TOBACCO ABUSE   HYPERTENSION   PAF (paroxysmal atrial fibrillation) on chronic coumadin   COPD (chronic obstructive pulmonary disease)   WEIGHT LOSS   Long-term (current) use of anticoagulants   Chronic combined  systolic and diastolic congestive heart failure   Lightheadedness   SOB (shortness of breath)   Non-ischemic cardiomyopathy   Unstable angina   Severe malnutrition  1. afib Maintaining sinus with multaq.  He has tried multiple AADs in the past and this has been the only effective one.   Continue coumadin, continue IV heparin  2. Chronic systolic dysfunction Appears compensated.  I do not believe that he is fluid overloaded presently. Keep Is and Os about even  3. Sinus bradycardia Normal pacemaker function Continue toprol to 25mg  daily.  4. Tobacco Cessation advised  No further inpatient CV eval planned Continue current management Will see as needed  Call with questions

## 2013-05-19 NOTE — Progress Notes (Signed)
SATURATION QUALIFICATIONS: (This note is used to comply with regulatory documentation for home oxygen)  Patient Saturations on Room Air at Rest = 99%  Patient Saturations on Room Air while Ambulating = 83%  Patient Saturations on 2 Liters of oxygen while Ambulating = 100%  Please briefly explain why patient needs home oxygen: Patient's Saturations dropped to 83% while walking on RA

## 2013-05-19 NOTE — Evaluation (Addendum)
Occupational Therapy Evaluation Patient Details Name: Travis BOLYARD Sr. MRN: [id number] DOB: 15-Mar-1946 Today's Date: 05/19/2013 Time: 9563-8756 third attempt to see patient. Education provided OT Time Calculation (min): 10 min  OT Assessment / Plan / Recommendation History of present illness 67 y.o. man with past medical history of for atrial fibrillation on metoprolol, dronedarone and noncompliance with coumadin therapy with prior compliance issues (last INR 2.1 on 04/12/2013), hypertension, CKD, COPD, tachy-brady syndrome with pacemaker, diastolic CHF with nonischemic cardiomyopathy EF 25-30% who presents with shortness of breath and productive cough of thick phlegm for the past month with cough worse in the past week. Pt reports 1 month ago he began having shortness of breath that is worse with activity.    Clinical Impression   Patient evaluated by Occupational Therapy with no further acute OT needs identified. All education has been completed and the patient has no further questions. See below for any follow-up Occupational Therapy or equipment needs. OT to sign off. Thank you for referral.      OT Assessment  Patient does not need any further OT services    Follow Up Recommendations  No OT follow up (pt states "i can manage")    Barriers to Discharge      Equipment Recommendations  None recommended by OT    Recommendations for Other Services    Frequency       Precautions / Restrictions Precautions Precautions: Fall Precaution Comments: oxygen level monitoring Restrictions Weight Bearing Restrictions: No   Pertinent Vitals/Pain 83% on RA with ambulation 95% at rest    ADL  Eating/Feeding: Independent Where Assessed - Eating/Feeding: Bed level Grooming: Wash/dry hands;Modified independent Where Assessed - Grooming: Unsupported standing Lower Body Dressing: Modified independent Where Assessed - Lower Body Dressing: Unsupported sitting Toilet Transfer: Modified  independent Toilet Transfer Method: Sit to stand Toilet Transfer Equipment: Regular height toilet Equipment Used: Gait belt;Cane Transfers/Ambulation Related to ADLs: Pt ambulated ~100 ft with quad cane with oxygen decr to 83% on RA. pt with pursed lip breathing able to rebound in < 45 seconds. Pt DOE and educated on self awareness to SOB ADL Comments: Pt educated on energy conservation and risk for falls with decr oxygen saturation. Pt plans to have brother come (A) with household management. Pt educated on avoiding hot steam showers due to risk of falls with decr oxygen saturations.     OT Diagnosis:    OT Problem List:   OT Treatment Interventions:     OT Goals(Current goals can be found in the care plan section) Acute Rehab OT Goals Patient Stated Goal:  to go home and to church tonight for new years eve OT Goal Formulation: With patient  Visit Information  Last OT Received On: 05/19/13 Assistance Needed: +1 Reason Eval/Treat Not Completed: Patient declined, no reason specified History of Present Illness: 67 y.o. man with past medical history of for atrial fibrillation on metoprolol, dronedarone and noncompliance with coumadin therapy with prior compliance issues (last INR 2.1 on 04/12/2013), hypertension, CKD, COPD, tachy-brady syndrome with pacemaker, diastolic CHF with nonischemic cardiomyopathy EF 25-30% who presents with shortness of breath and productive cough of thick phlegm for the past month with cough worse in the past week. Pt reports 1 month ago he began having shortness of breath that is worse with activity.        Prior Functioning     Home Living Family/patient expects to be discharged to:: Private residence Living Arrangements: Other relatives Available Help at Discharge: Family;Available 24  hours/day Type of Home: House Home Access: Stairs to enter [employer] of Steps: 3 Entrance Stairs-Rails: None Home Layout: One level Home Equipment: Teacher, English as a foreign language - 2 wheels Prior Function Level of Independence: Independent with assistive device(s) Comments: taxi driver Communication Communication: No difficulties Dominant Hand: Right         Vision/Perception Vision - History Baseline Vision: Wears glasses all the time Patient Visual Report: No change from baseline Vision - Assessment Vision Assessment: Vision not tested   [employer] Arousal/Alertness: Awake/alert Behavior During Therapy: WFL for tasks assessed/performed Overall Cognitive Status: Within Functional Limits for tasks assessed    Extremity/Trunk Assessment Upper Extremity Assessment Upper Extremity Assessment: Overall WFL for tasks assessed Lower Extremity Assessment Lower Extremity Assessment: Defer to PT evaluation Cervical / Trunk Assessment Cervical / Trunk Assessment: Normal     Mobility Bed Mobility Bed Mobility: Supine to Sit;Sitting - Scoot to Edge of Bed Supine to Sit: 6: Modified independent (Device/Increase time);With rails Sitting - Scoot to Edge of Bed: 6: Modified independent (Device/Increase time);With rail Details for Bed Mobility Assistance: no physcial (A) required. Able to push up into sititng using elbow Transfers Transfers: Stand to Sit;Sit to Stand Sit to Stand: With upper extremity assist;From bed;6: Modified independent (Device/Increase time) Stand to Sit: With upper extremity assist;To bed;6: Modified independent (Device/Increase time)     Exercise     Balance     End of Session OT - End of Session Activity Tolerance: Patient tolerated treatment well Patient left: with call bell/phone within reach;in bed Nurse Communication: Mobility status;Precautions  GO Functional Assessment Tool Used: clinical judgement Functional Limitation: Self care Self Care Current Status (W0981): 0 percent impaired, limited or restricted Self Care Goal Status (X9147): 0 percent impaired, limited or restricted Self Care Discharge  Status (W2956): 0 percent impaired, limited or restricted   Boone Master B 05/19/2013, 10:37 AM Pager: [phone]

## 2013-05-19 NOTE — Progress Notes (Signed)
  Date: 05/19/2013  Patient name: Travis COOKS Sr.  Medical record number: [id number]  Date of birth: 1945/06/17   This patient has been seen and the plan of care was discussed with the house staff. Please see their note for complete details. I concur with their findings with the following additions/corrections: [name] remains feeling better but not yet at baseline. Improved air flow on R but with cont wheezes. Decreased to 83% sat on RA with ambulation. OK for D/C if O2 can be set up.   I spoke to Dr Meredith Pel regarding the [facility] and he was OK with 4 week trial but emphasized no THC in any other form. I had informed [name] about this requirement.    Burns Spain, MD 05/19/2013, 11:45 AM

## 2013-05-19 NOTE — Progress Notes (Addendum)
Subjective:  Pt seen and examined in AM. No acute events overnight. Pt reports he feels good. Still with cough. He denies fever, chills, dyspnea, wheezing, chest pain, and palpitations. He has good appetite with prednisone. He was able to ambulate with OT without lightheadedness, but reported to be hypoxic.      Objective: Vital signs in last 24 hours: Filed Vitals:   05/19/13 0609 05/19/13 0803 05/19/13 0956 05/19/13 1428  BP: 142/82  114/73 151/83  Pulse: 61  63 63  Temp: 98.7 F (37.1 C)  98.1 F (36.7 C) 97.8 F (36.6 C)  TempSrc: Oral  Oral Oral  Resp: 20  18 18   Height:      Weight:      SpO2: 100% 100% 98% 99%   Weight change:   Intake/Output Summary (Last 24 hours) at 05/19/13 1527 Last data filed at 05/19/13 1430  Gross per 24 hour  Intake   1326 ml  Output    645 ml  Net    681 ml    Physical Exam  Constitutional: He is oriented to person, place, and time. No distress.  Thin appearing  HENT:  Head: Normocephalic and atraumatic.  Right Ear: External ear normal.  Left Ear: External ear normal.  Nose: Nose normal.  Mouth/Throat: Oropharynx is clear and moist. No oropharyngeal exudate.  Eyes: Conjunctivae and EOM are normal. Pupils are equal, round, and reactive to light. Right eye exhibits no discharge. Left eye exhibits no discharge.  Neck: Normal range of motion. Neck supple.  Cardiovascular: Normal rate, regular rhythm and normal heart sounds.  Pulmonary/Chest: No respiratory distress. He exhibits no tenderness.  Diffuse ronchi with scattered expiratory wheezing, L>R Abdominal: Soft. Bowel sounds are normal. He exhibits no distension. There is no tenderness. There is no rebound and no guarding.  Musculoskeletal: Normal range of motion.  Neurological: He is alert and oriented to person, place, and time. No cranial nerve deficit.  Decreased sensation to light touch of b/l feet  Skin: Skin is warm and dry. No rash noted. He is not diaphoretic. No erythema.  No pallor.  Darkened skin of b/l feet  Psychiatric: He has a normal mood and affect. His behavior is normal. Judgment and thought content normal.    Lab Results: Basic Metabolic Panel:  Recent Labs Lab 05/18/13 0910 05/19/13 0530  NA 141 146  K 4.2 5.1  CL 104 110  CO2 17* 25  GLUCOSE 231* 85  BUN 23 28*  CREATININE 1.59* 1.67*  CALCIUM 8.8 8.8   CBC:  Recent Labs Lab 05/18/13 0910 05/19/13 0530  WBC 4.5 11.0*  HGB 14.0 12.9*  HCT 41.5 38.3*  MCV 91.2 92.1  PLT 128* 126*   Cardiac Enzymes:  Recent Labs Lab 05/17/13 1945 05/18/13 0030  TROPONINI <0.30 <0.30   BNP:  Recent Labs Lab 05/17/13 1225  PROBNP 6363.0*   D-Dimer: No results found for this basename: DDIMER,  in the last 168 hours CBG:  Recent Labs Lab 05/18/13 0622 05/19/13 0636  GLUCAP 138* 97   Coagulation:  Recent Labs Lab 05/17/13 1225 05/18/13 0910 05/19/13 0530  LABPROT 13.7 13.7 11.8  INR 1.07 1.07 0.88   Studies/Results: Nm Pulmonary Perf And Vent  05/17/2013   CLINICAL DATA:  History of DVT and COPD, now with shortness of breath for 2 weeks, evaluate for pulmonary embolism  EXAM: NUCLEAR MEDICINE VENTILATION - PERFUSION LUNG SCAN  TECHNIQUE: Ventilation images were obtained in multiple projections using inhaled aerosol technetium 99 M  DTPA. Perfusion images were obtained in multiple projections after intravenous injection of Tc-73m MAA.  COMPARISON:  Chest radiograph - 05/17/2013; 08/14/2011; chest CT -03/18/2012; VQ scan - 03/18/2012  RADIOPHARMACEUTICALS:  40 mCi Tc-26m DTPA aerosol and 6 mCi Tc-87m MAA  FINDINGS: Review of chest radiograph performed earlier same day demonstrates grossly unchanged borderline enlarged cardiac silhouette and mediastinal contours. A pacer power pack overlies the peripheral aspect of the right upper/mid lung. The lungs remain hyperexpanded. There is grossly unchanged architectural distortion and volume loss primarily involving the left mid lung.  Grossly unchanged elevation of the left hemidiaphragm and left apical asymmetric pleural parenchymal thickening. Sequela of prior talc pleurodesis involving the left lung is not well demonstrated for was seen on remote chest CT. No definite pleural effusion or pneumothorax.  Ventilation: There is clumping of inhaled radiotracer about the bilateral pulmonary hila, right greater than left, similar to prior V/Q scan. There is grossly unchanged hypoventilation of the left mid and lower lungs with mottled undulation of the left upper lung. There is preferential ventilation of the right mid and lower lung. There is an expected photopenic defect at the location of the right anterior chest wall pacemaker power pack. Ingested radiotracer is seen within the hypopharynx distal esophagus and stomach.  Perfusion: There is matched hypoperfusion involving in the left mid and lower lung with matched mottled perfusion involving the remainder of the left lung. There is an expected photopenia defect at the location of the patient's known right anterior chest wall power pack. Otherwise, there is relative homogeneous perfusion of the right lung parenchyma.  IMPRESSION: 1. Pulmonary embolism absent (low probability for pulmonary embolism) - grossly unchanged from prior V/Q scan performed 02/2012. 2. Persistent volume loss involving the left mid and lower lung with associated matched decreased ventilation and perfusion.   Electronically Signed   By: Simonne Come M.D.   On: 05/17/2013 16:45   Medications: I have reviewed the patient's current medications. Scheduled Meds: . albuterol  5 mg Nebulization Once  . azithromycin  250 mg Oral Daily  . buPROPion  75 mg Oral BID  . dronabinol  2.5 mg Oral BID AC  . dronedarone  400 mg Oral BID WC  . feeding supplement (RESOURCE BREEZE)  1 Container Oral BID BM  . guaiFENesin  600 mg Oral BID  . ipratropium-albuterol  3 mL Nebulization TID  . lisinopril  5 mg Oral Daily  . metoprolol  succinate  25 mg Oral Daily  . pantoprazole  40 mg Oral Daily  . predniSONE  60 mg Oral Q breakfast  . sodium chloride  3 mL Intravenous Q12H  . sodium chloride  3 mL Intravenous Q12H  . warfarin  15 mg Oral ONCE-1800  . Warfarin - Pharmacist Dosing Inpatient   Does not apply q1800   Continuous Infusions:  PRN Meds:.acetaminophen, acetaminophen, hydrocortisone cream, oxyCODONE-acetaminophen Assessment/Plan: Principal Problem:   COPD exacerbation Active Problems:   TOBACCO ABUSE   HYPERTENSION   PAF (paroxysmal atrial fibrillation) on chronic coumadin   COPD (chronic obstructive pulmonary disease)   WEIGHT LOSS   Long-term (current) use of anticoagulants   Chronic combined systolic and diastolic congestive heart failure   Lightheadedness   SOB (shortness of breath)   Non-ischemic cardiomyopathy   Unstable angina   Severe malnutrition  Assessment: 67 y.o. man with past medical history of for atrial fibrillation on metoprolol, dronedarone and noncompliance with coumadin therapy with prior compliance issues (last INR 2.1 on 04/12/2013), hypertension, CKD, COPD, tachy-brady  syndrome with pacemaker, combined CHF with nonischemic cardiomyopathy EF 25-30% who presented on 12/29 with dyspnea, cough, change in sputum production and consistency for the past month and found to have COPD exacerbation.   Plan:   COPD exacerbation - improved clinically, still with ronchi and mild wheezing. Today with oxygen desaturation with ambulation -PO 60 mg prednisone Day 3/5  -Breathing treamtent Q4hr  -PO azithromycin 250 mg Day 3/5  -Mucinex 600 mg BID  -Pulse oximetry monitoring and oxygen therapy as needed to keep SpO2> 92%  -Home oxygen therapy -Encourage tobacco cessation -Consider outpatient PFT   Unstable Angina - chest pain free with negative CE and improvement of T-wave changes on 12-lead EKG  -2D-Echo unremarkable    -Appreciate cardiology consult --> no further inpatient work-up, pt to  follow in clinic with Dr. Johney Frame   QT prolongation - QTc 487  -Avoid QT prolonging medications (including zofran)  Lightheadedness - improving. Pt with history of tachy-brady syndrome with pacemaker. Pt with episodes of possible arrythmia vs bradycardia (HR 58 in ED) in setting of pre-syncope/syncope.  -Orthostatic vitals negative  -Interrogate pacemaker --> Normal pacemaker function, programmed VVI 50 and turned hysteresis on -Appreciate cardiology consult --> decrease metoprolol from 50 to 25mg  daily -PT/OT consult ---> home health PT & RN -Home oxygen therapy   Weight loss - Pt reports unintentional 50 lb weight loss in last year with normal colonoscopy yin 10/2010, with repeat in 10 years. 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 Also with small stable pulmonary nodules on CT chest on 09/2012. Unclear etiology with ongoing work-up being performed as outpatient.  -Nutrition consult - feeding supplements -Marinol 2.5 BID for 4 weeks, pt counseled to not take THC concomitantly   Paroxysmal Atrial Fibrillation - Pt on rate control with metoprolol, rhythm control with dronedarone, and AC with history of noncompliance with coumadin with subtherapeutic INR 1.07  -Metoprolol succinate 50 to 25mg  daily  -Continue dronedarone 400 mg BID  -Continue coumadin per pharmacy  - to discharge on 12.5mg  daily until INR check on Monday -Continue to monitor INR  Combined Systolic and Diastolic CHF - Pt with EF of 25-30% on 2D-echo with moderate LVH, and grade 1 diastolic dysfunction on 11/2012 with pro-BNP of 6363 on admission without signs of volume overload on exam or CXR .  -Obtain 2D-echo --> normal EF 55-60%, no wall motion, diastolic function could not be assessed  -Appreciate cardiology recs   Hypertension - currently normotensive  -Continue lisinopril 5 mg daily  -Decrease metoprolol succinate 50 mg to 25 mg daily   CKD Stage 3 -  Pt with baseline Cr 2.0 who presented with Cr of 1.74 on admission.  -Avoid nephrotoxins  -Monitor weight (144 to 138) & I & O (net -2.2L  total) -Continue to monitor renal function   Rash - pruritic on right lower abd quadrant and right post thoracic area for 2 months of unknown etiology  -Apply hydrocortisone cream BID  PRN -Monitor outpatient for possible skin biopsy if does not resolve  Diet: Regular  Code: Full   Dispo: Disposition is deferred at this time, awaiting improvement of current medical problems.  Anticipated discharge in approximately 1 day(s).   The patient does have a current PCP [name], MD) and does need an [facility] hospital follow-up appointment after discharge.  The patient does not have transportation limitations that hinder transportation to clinic appointments.  .Services Needed at time of discharge: Y =  Yes, Blank = No PT:   OT:   RN:   Equipment:   Other:     LOS: 2 days   Otis Brace, MD 05/19/2013, 3:27 PM

## 2013-05-19 NOTE — Progress Notes (Signed)
Patient's IV and telemetry has been discontinued, patient verbalizes understanding of discharge instructions and is being discharged home. Lorretta Harp RN

## 2013-05-22 ENCOUNTER — Emergency Department (HOSPITAL_COMMUNITY): Payer: Medicare HMO

## 2013-05-22 ENCOUNTER — Inpatient Hospital Stay (HOSPITAL_COMMUNITY)
Admission: EM | Admit: 2013-05-22 | Discharge: 2013-05-30 | DRG: 308 | Disposition: A | Discharge status: Home Health | Payer: Medicare HMO | Attending: Internal Medicine | Admitting: Internal Medicine

## 2013-05-22 ENCOUNTER — Telehealth: Payer: Self-pay | Admitting: Physician Assistant

## 2013-05-22 ENCOUNTER — Encounter (HOSPITAL_COMMUNITY): Payer: Self-pay | Admitting: Emergency Medicine

## 2013-05-22 DIAGNOSIS — Z950 Presence of cardiac pacemaker: Secondary | ICD-10-CM | POA: Diagnosis present

## 2013-05-22 DIAGNOSIS — K219 Gastro-esophageal reflux disease without esophagitis: Secondary | ICD-10-CM | POA: Diagnosis present

## 2013-05-22 DIAGNOSIS — N189 Chronic kidney disease, unspecified: Secondary | ICD-10-CM | POA: Diagnosis present

## 2013-05-22 DIAGNOSIS — J209 Acute bronchitis, unspecified: Secondary | ICD-10-CM | POA: Diagnosis present

## 2013-05-22 DIAGNOSIS — R791 Abnormal coagulation profile: Secondary | ICD-10-CM | POA: Diagnosis present

## 2013-05-22 DIAGNOSIS — Z79899 Other long term (current) drug therapy: Secondary | ICD-10-CM

## 2013-05-22 DIAGNOSIS — I428 Other cardiomyopathies: Secondary | ICD-10-CM

## 2013-05-22 DIAGNOSIS — Z91199 Patient's noncompliance with other medical treatment and regimen due to unspecified reason: Secondary | ICD-10-CM

## 2013-05-22 DIAGNOSIS — I129 Hypertensive chronic kidney disease with stage 1 through stage 4 chronic kidney disease, or unspecified chronic kidney disease: Secondary | ICD-10-CM | POA: Diagnosis present

## 2013-05-22 DIAGNOSIS — IMO0002 Reserved for concepts with insufficient information to code with codable children: Secondary | ICD-10-CM

## 2013-05-22 DIAGNOSIS — Z7901 Long term (current) use of anticoagulants: Secondary | ICD-10-CM

## 2013-05-22 DIAGNOSIS — I482 Chronic atrial fibrillation, unspecified: Secondary | ICD-10-CM | POA: Diagnosis present

## 2013-05-22 DIAGNOSIS — E41 Nutritional marasmus: Secondary | ICD-10-CM | POA: Diagnosis present

## 2013-05-22 DIAGNOSIS — I5042 Chronic combined systolic (congestive) and diastolic (congestive) heart failure: Secondary | ICD-10-CM | POA: Diagnosis present

## 2013-05-22 DIAGNOSIS — I81 Portal vein thrombosis: Secondary | ICD-10-CM

## 2013-05-22 DIAGNOSIS — I10 Essential (primary) hypertension: Secondary | ICD-10-CM | POA: Diagnosis present

## 2013-05-22 DIAGNOSIS — N179 Acute kidney failure, unspecified: Secondary | ICD-10-CM | POA: Diagnosis present

## 2013-05-22 DIAGNOSIS — J441 Chronic obstructive pulmonary disease with (acute) exacerbation: Secondary | ICD-10-CM | POA: Diagnosis present

## 2013-05-22 DIAGNOSIS — N183 Chronic kidney disease, stage 3 unspecified: Secondary | ICD-10-CM | POA: Diagnosis present

## 2013-05-22 DIAGNOSIS — J449 Chronic obstructive pulmonary disease, unspecified: Secondary | ICD-10-CM

## 2013-05-22 DIAGNOSIS — M1990 Unspecified osteoarthritis, unspecified site: Secondary | ICD-10-CM | POA: Diagnosis present

## 2013-05-22 DIAGNOSIS — I4892 Unspecified atrial flutter: Principal | ICD-10-CM | POA: Diagnosis present

## 2013-05-22 DIAGNOSIS — G4700 Insomnia, unspecified: Secondary | ICD-10-CM | POA: Diagnosis present

## 2013-05-22 DIAGNOSIS — Z96649 Presence of unspecified artificial hip joint: Secondary | ICD-10-CM

## 2013-05-22 DIAGNOSIS — Z8639 Personal history of other endocrine, nutritional and metabolic disease: Secondary | ICD-10-CM | POA: Diagnosis present

## 2013-05-22 DIAGNOSIS — E0590 Thyrotoxicosis, unspecified without thyrotoxic crisis or storm: Secondary | ICD-10-CM | POA: Diagnosis present

## 2013-05-22 DIAGNOSIS — I5032 Chronic diastolic (congestive) heart failure: Secondary | ICD-10-CM

## 2013-05-22 DIAGNOSIS — Z9119 Patient's noncompliance with other medical treatment and regimen: Secondary | ICD-10-CM

## 2013-05-22 DIAGNOSIS — G589 Mononeuropathy, unspecified: Secondary | ICD-10-CM | POA: Diagnosis present

## 2013-05-22 DIAGNOSIS — F319 Bipolar disorder, unspecified: Secondary | ICD-10-CM | POA: Diagnosis present

## 2013-05-22 DIAGNOSIS — Z9981 Dependence on supplemental oxygen: Secondary | ICD-10-CM

## 2013-05-22 DIAGNOSIS — I480 Paroxysmal atrial fibrillation: Secondary | ICD-10-CM | POA: Diagnosis present

## 2013-05-22 DIAGNOSIS — E8809 Other disorders of plasma-protein metabolism, not elsewhere classified: Secondary | ICD-10-CM | POA: Diagnosis present

## 2013-05-22 DIAGNOSIS — R21 Rash and other nonspecific skin eruption: Secondary | ICD-10-CM | POA: Diagnosis present

## 2013-05-22 DIAGNOSIS — D72819 Decreased white blood cell count, unspecified: Secondary | ICD-10-CM | POA: Diagnosis present

## 2013-05-22 DIAGNOSIS — Z87891 Personal history of nicotine dependence: Secondary | ICD-10-CM | POA: Diagnosis present

## 2013-05-22 DIAGNOSIS — E875 Hyperkalemia: Secondary | ICD-10-CM | POA: Diagnosis not present

## 2013-05-22 DIAGNOSIS — F17200 Nicotine dependence, unspecified, uncomplicated: Secondary | ICD-10-CM | POA: Diagnosis present

## 2013-05-22 DIAGNOSIS — D649 Anemia, unspecified: Secondary | ICD-10-CM | POA: Diagnosis not present

## 2013-05-22 DIAGNOSIS — Z86718 Personal history of other venous thrombosis and embolism: Secondary | ICD-10-CM

## 2013-05-22 DIAGNOSIS — I4891 Unspecified atrial fibrillation: Secondary | ICD-10-CM

## 2013-05-22 DIAGNOSIS — I495 Sick sinus syndrome: Secondary | ICD-10-CM

## 2013-05-22 LAB — BASIC METABOLIC PANEL
BUN: 25 mg/dL — ABNORMAL HIGH (ref 6–23)
CALCIUM: 8.1 mg/dL — AB (ref 8.4–10.5)
CO2: 21 mEq/L (ref 19–32)
CREATININE: 1.55 mg/dL — AB (ref 0.50–1.35)
Chloride: 110 mEq/L (ref 96–112)
GFR calc Af Amer: 51 mL/min — ABNORMAL LOW (ref >90)
GFR calc non Af Amer: 44 mL/min — ABNORMAL LOW (ref >90)
Glucose, Bld: 107 mg/dL — ABNORMAL HIGH (ref 70–99)
Potassium: 4.1 mEq/L (ref 3.7–5.3)
SODIUM: 144 meq/L (ref 137–147)

## 2013-05-22 LAB — POCT I-STAT TROPONIN I: Troponin i, poc: 0.02 ng/mL (ref 0.00–0.08)

## 2013-05-22 LAB — PRO B NATRIURETIC PEPTIDE: PRO B NATRI PEPTIDE: 9662 pg/mL — AB (ref 0–125)

## 2013-05-22 LAB — CBC
HCT: 42.9 % (ref 39.0–52.0)
Hemoglobin: 14.2 g/dL (ref 13.0–17.0)
MCH: 31.1 pg (ref 26.0–34.0)
MCHC: 33.1 g/dL (ref 30.0–36.0)
MCV: 94.1 fL (ref 78.0–100.0)
Platelets: 175 10*3/uL (ref 150–400)
RBC: 4.56 MIL/uL (ref 4.22–5.81)
RDW: 15.9 % — AB (ref 11.5–15.5)
WBC: 11.7 10*3/uL — AB (ref 4.0–10.5)

## 2013-05-22 LAB — PROTIME-INR
INR: 1.33 (ref 0.00–1.49)
Prothrombin Time: 16.2 seconds — ABNORMAL HIGH (ref 11.6–15.2)

## 2013-05-22 MED ORDER — PANTOPRAZOLE SODIUM 40 MG PO TBEC
40.0000 mg | DELAYED_RELEASE_TABLET | Freq: Every day | ORAL | Status: DC
  Administered 2013-05-23 – 2013-05-30 (×8): 40 mg via ORAL
  Filled 2013-05-22 (×6): qty 1

## 2013-05-22 MED ORDER — LISINOPRIL 5 MG PO TABS
5.0000 mg | ORAL_TABLET | Freq: Every day | ORAL | Status: DC
  Administered 2013-05-23 – 2013-05-30 (×8): 5 mg via ORAL
  Filled 2013-05-22 (×8): qty 1

## 2013-05-22 MED ORDER — DRONABINOL 2.5 MG PO CAPS
2.5000 mg | ORAL_CAPSULE | Freq: Two times a day (BID) | ORAL | Status: DC
  Administered 2013-05-23 – 2013-05-30 (×14): 2.5 mg via ORAL
  Filled 2013-05-22 (×15): qty 1

## 2013-05-22 MED ORDER — DILTIAZEM HCL 100 MG IV SOLR
5.0000 mg/h | INTRAVENOUS | Status: DC
  Administered 2013-05-22: 5 mg/h via INTRAVENOUS
  Administered 2013-05-23: 10 mg/h via INTRAVENOUS
  Administered 2013-05-23 – 2013-05-24 (×2): 15 mg/h via INTRAVENOUS
  Filled 2013-05-22 (×4): qty 100

## 2013-05-22 MED ORDER — PREDNISONE 20 MG PO TABS
40.0000 mg | ORAL_TABLET | Freq: Every day | ORAL | Status: DC
  Administered 2013-05-23: 40 mg via ORAL
  Filled 2013-05-22 (×2): qty 2

## 2013-05-22 MED ORDER — OXYCODONE-ACETAMINOPHEN 5-325 MG PO TABS
1.0000 | ORAL_TABLET | Freq: Four times a day (QID) | ORAL | Status: DC | PRN
  Administered 2013-05-23 – 2013-05-30 (×19): 1 via ORAL
  Filled 2013-05-22 (×19): qty 1

## 2013-05-22 MED ORDER — NICOTINE 7 MG/24HR TD PT24
7.0000 mg | MEDICATED_PATCH | Freq: Every day | TRANSDERMAL | Status: DC
  Administered 2013-05-23 – 2013-05-30 (×8): 7 mg via TRANSDERMAL
  Filled 2013-05-22 (×8): qty 1

## 2013-05-22 MED ORDER — PIPERACILLIN-TAZOBACTAM 3.375 G IVPB
3.3750 g | Freq: Three times a day (TID) | INTRAVENOUS | Status: DC
  Administered 2013-05-22 – 2013-05-23 (×3): 3.375 g via INTRAVENOUS
  Filled 2013-05-22 (×5): qty 50

## 2013-05-22 MED ORDER — VANCOMYCIN HCL IN DEXTROSE 1-5 GM/200ML-% IV SOLN
1000.0000 mg | INTRAVENOUS | Status: DC
  Administered 2013-05-22: 1000 mg via INTRAVENOUS
  Filled 2013-05-22 (×2): qty 200

## 2013-05-22 MED ORDER — METOPROLOL SUCCINATE ER 25 MG PO TB24
25.0000 mg | ORAL_TABLET | Freq: Every day | ORAL | Status: DC
  Filled 2013-05-22: qty 1

## 2013-05-22 MED ORDER — OXYCODONE-ACETAMINOPHEN 10-325 MG PO TABS: 1.0000 | ORAL_TABLET | Freq: Four times a day (QID) | ORAL | Status: DC | PRN

## 2013-05-22 MED ORDER — WARFARIN SODIUM 7.5 MG PO TABS
15.0000 mg | ORAL_TABLET | Freq: Once | ORAL | Status: AC
  Administered 2013-05-23: 15 mg via ORAL
  Filled 2013-05-22 (×2): qty 2

## 2013-05-22 MED ORDER — IPRATROPIUM BROMIDE HFA 17 MCG/ACT IN AERS
2.0000 | INHALATION_SPRAY | Freq: Four times a day (QID) | RESPIRATORY_TRACT | Status: DC
  Administered 2013-05-23: 2 via RESPIRATORY_TRACT
  Filled 2013-05-22: qty 12.9

## 2013-05-22 MED ORDER — HEPARIN SODIUM (PORCINE) 5000 UNIT/ML IJ SOLN
5000.0000 [IU] | Freq: Three times a day (TID) | INTRAMUSCULAR | Status: DC
  Administered 2013-05-23: 5000 [IU] via SUBCUTANEOUS
  Filled 2013-05-22 (×4): qty 1

## 2013-05-22 MED ORDER — DILTIAZEM LOAD VIA INFUSION
10.0000 mg | Freq: Once | INTRAVENOUS | Status: AC
Start: 2013-05-22 — End: 2013-05-22
  Administered 2013-05-22: 10 mg via INTRAVENOUS
  Filled 2013-05-22: qty 10

## 2013-05-22 MED ORDER — DRONEDARONE HCL 400 MG PO TABS
400.0000 mg | ORAL_TABLET | Freq: Two times a day (BID) | ORAL | Status: DC
  Administered 2013-05-22 – 2013-05-27 (×11): 400 mg via ORAL
  Filled 2013-05-22 (×14): qty 1

## 2013-05-22 MED ORDER — OXYCODONE HCL 5 MG PO TABS
5.0000 mg | ORAL_TABLET | Freq: Four times a day (QID) | ORAL | Status: DC | PRN
  Administered 2013-05-23 – 2013-05-30 (×20): 5 mg via ORAL
  Filled 2013-05-22 (×21): qty 1

## 2013-05-22 MED ORDER — SODIUM CHLORIDE 0.9 % IJ SOLN
3.0000 mL | Freq: Two times a day (BID) | INTRAMUSCULAR | Status: DC
  Administered 2013-05-23 – 2013-05-29 (×11): 3 mL via INTRAVENOUS

## 2013-05-22 MED ORDER — WARFARIN - PHARMACIST DOSING INPATIENT
Freq: Every day | Status: DC
  Administered 2013-05-27: 18:00:00

## 2013-05-22 MED ORDER — LEVALBUTEROL TARTRATE 45 MCG/ACT IN AERO
2.0000 | INHALATION_SPRAY | Freq: Four times a day (QID) | RESPIRATORY_TRACT | Status: DC
  Administered 2013-05-23: 2 via RESPIRATORY_TRACT
  Filled 2013-05-22: qty 15

## 2013-05-22 MED ORDER — BUPROPION HCL 75 MG PO TABS
75.0000 mg | ORAL_TABLET | Freq: Two times a day (BID) | ORAL | Status: DC
  Administered 2013-05-22 – 2013-05-30 (×16): 75 mg via ORAL
  Filled 2013-05-22 (×17): qty 1

## 2013-05-22 MED ORDER — OFF THE BEAT BOOK
Freq: Once | Status: AC
  Administered 2013-05-23
  Filled 2013-05-22: qty 1

## 2013-05-22 NOTE — ED Provider Notes (Signed)
CSN: [id number]     Arrival date & time 05/22/13  1856 History   First MD Initiated Contact with Patient 05/22/13 1913     Chief Complaint  Patient presents with  . Chest Pain  . Shortness of Breath   (Consider location/radiation/quality/duration/timing/severity/associated sxs/prior Treatment) Patient is a 68 y.o. male presenting with chest pain.  Chest Pain Pain location:  Substernal area Pain quality: sharp   Pain radiates to:  Does not radiate Pain radiates to the back: no   Pain severity:  Mild Onset quality:  Gradual Duration:  1 week Timing:  Constant Progression:  Waxing and waning Chronicity:  New Relieved by:  Nothing Associated symptoms: no abdominal pain, no back pain, no cough, no fever, no headache, no nausea, no shortness of breath and not vomiting     Past Medical History  Diagnosis Date  . Hypertension   . CKD (chronic kidney disease)     Renal U/S 12/04/2009 showed no pathological findings. Labs 12/04/2009 include normal ESR, C3, C4; neg ANA; SPEP showed nonspecific increase in the alpha-2 region with no M-spike; UPEP showed no monoclonal free light chains; urine IFE showed polyclonal increase in feree Kappa and/or free Lambda light chains. Baseline Cr reported 1.7-2.5.  [name] Chronic systolic heart failure     a. NICM - EF 35% with normal cors 2003. b. Last echo 11/2012 - EF 25-30%.  . Depression   . GERD (gastroesophageal reflux disease)   . Portal vein thrombosis   . Avascular necrosis     right hip s/p replacement  . Gastritis   . Alcohol abuse   . Erectile dysfunction   . Bipolar disorder   . Hydrocele, unspecified   . Intermittent claudication   . Lung nodule     Chest CT scan on 12/14/2008 showed a nodular opacity at the left lung base felt to most likely represent scarring.  Follow-up chest CT scan on 06/02/2010 showed parenchymal scarring in the left apex, left  lower lobe, and lingula; some of this scarring at the left base had a nodular appearance,  unchanged. Chest 09/2012 - stable.  . 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  Thyroid scan 08/28/2009 was normal with no focal areas of abnormal increased or decreased activity seen; the uptake of I 131 sodium iodide at 24 hours was 5.7%.  TSH and free T4 normalized by 08/16/2009.  [name] Pneumonia   . Intestinal obstruction   . COPD (chronic obstructive pulmonary disease)   . Clostridium difficile colitis   . E coli bacteremia   . Cluster headaches   . DVT (deep venous thrombosis)     He has hypercoagulability with multiple prior DVTs  . Pleural plaque     H/o asbestos exposure. Chest CT on 06/02/2010 showed stable extensive calcified pleural plaques involving the left hemithorax, consistent with asbestos related pleural disease.  . Weight loss     Normal colonoscopy by Dr. Olevia Perches on 11/06/2010.  . Tachycardia-bradycardia syndrome     a. 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 b. [employer] gen change 2010.  [name] PAF (paroxysmal atrial fibrillation)     Paroxysmal, failed medical therapy with amiodarone but has done very well with multaq  . Osteoarthritis   . Spondylosis   . Chronic low back pain     "cause of my hip" (03/17/2012)  . 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  . Noncompliance   . Atrial flutter   . Thrombocytopenia    Past Surgical History  Procedure Laterality Date  . Left hip bipolar hemiarthroplasty  09/09/2002    By Dr. Lafayette Dragon  . Insert / replace / remove pacemaker  02/2003    By Dr. Roe Rutherford, gen change by Greggory Brandy  . Total hip arthroplasty  03/08/2008    right; By Dr. Hiram Comber   Family History  Problem Relation Age of Onset  . Hypertension Mother   . Cancer Brother     Unsure of type.  . Asthma Mother   . Coronary artery disease Mother   . Colon cancer Neg Hx   . Lung cancer Neg Hx   . Prostate cancer Neg Hx     History  Substance Use Topics  . Smoking status: Current Every Day Smoker -- 0.50 packs/day for 50 years    Types: Cigarettes  . Smokeless tobacco: Never Used  . Alcohol Use: No     Comment: prior etoh abuse    Review of Systems  Constitutional: Negative for fever and chills.  HENT: Negative for sore throat.   Eyes: Negative for pain.  Respiratory: Negative for cough and shortness of breath.   Cardiovascular: Positive for chest pain.  Gastrointestinal: Negative for nausea, vomiting and abdominal pain.  Genitourinary: Negative for dysuria and flank pain.  Musculoskeletal: Negative for back pain and neck pain.  Skin: Negative for rash.  Neurological: Negative for seizures and headaches.    Allergies  Digoxin and related and Penicillins  Home Medications   No current outpatient prescriptions on file. BP 152/96  Pulse 109  Temp(Src) 97.7 F (36.5 C) (Oral)  Resp 18  Ht '6\' 3"'  (1.905 m)  Wt 156 lb 4.9 oz (70.9 kg)  BMI 19.54 kg/m2  SpO2 98% Physical Exam  Constitutional: He is oriented to person, place, and time. He appears well-developed and well-nourished. No distress.  HENT:  Head: Normocephalic and atraumatic.  Eyes: Pupils are equal, round, and reactive to light.  Neck: Normal range of motion.  Cardiovascular: Regular rhythm.  Tachycardia present.   Pulmonary/Chest: Effort normal and breath sounds normal.  Abdominal: Soft. He exhibits no distension. There is no tenderness.  Musculoskeletal: Normal range of motion.  Neurological: He is alert and oriented to person, place, and time.  Skin: Skin is warm. He is not diaphoretic.    ED Course  Procedures (including critical care time) Labs Review Labs Reviewed  CBC - Abnormal; Notable for the following:    WBC 11.7 (*)    RDW 15.9 (*)    All other components within normal limits  BASIC METABOLIC PANEL - Abnormal; Notable for the following:    Glucose, Bld 107 (*)    BUN 25 (*)    Creatinine, Ser 1.55 (*)     Calcium 8.1 (*)    GFR calc non Af Amer 44 (*)    GFR calc Af Amer 51 (*)    All other components within normal limits  PRO B NATRIURETIC PEPTIDE - Abnormal; Notable for the following:    Pro B Natriuretic peptide (BNP) 9662.0 (*)    All other components within normal limits  PROTIME-INR - Abnormal; Notable for the following:    Prothrombin Time 16.2 (*)    All other components within normal limits  CULTURE, EXPECTORATED SPUTUM-ASSESSMENT  PROTIME-INR  STREP PNEUMONIAE URINARY ANTIGEN  LEGIONELLA ANTIGEN, URINE  TROPONIN I  TROPONIN I  POCT I-STAT TROPONIN I  Imaging Review Dg Chest 2 View  05/22/2013   CLINICAL DATA:  Chest pain with shortness of breath. History of pacemaker and congestive heart failure.  EXAM: CHEST  2 VIEW  COMPARISON:  05/17/2013 radiographs.  CT 10/12/2012.  FINDINGS: The right subclavian pacemaker leads appear unchanged within the right ventricle. The heart size and mediastinal contours are stable. There is chronic volume loss in the left hemithorax with parenchymal scarring, pleural thickening and pleural calcification. Compared with the prior studies, there is increased density inferiorly suspicious for superimposed airspace disease. The right lung is clear.  IMPRESSION: Increased density in the lower left hemithorax compared with prior studies, suspicious for pneumonia superimposed on chronic pleural parenchymal scarring. Radiographic follow up recommended.   Electronically Signed   By: Camie Patience M.D.   On: 05/22/2013 20:46    EKG Interpretation    Date/Time:  Saturday May 22 2013 19:00:44 EST Ventricular Rate:  134 PR Interval:    QRS Duration: 86 QT Interval:  326 QTC Calculation: 486 R Axis:   -11 Text Interpretation:  Atrial flutter with variable A-V block Nonspecific ST and T wave abnormality Abnormal ECG Atrial flutter versus afib ST-t wave abnormality new changs since last tracing Abnormal ekg Confirmed by Carmin Muskrat  MD ([id number]) on 05/22/2013  7:14:05 PM            MDM   1. Atrial fibrillation    68 yo M with hx of HTN, CKD, CHF who was recently admitted to hospital, presents with chest pains, afib with RVR.   Patient HDS upon arrival. Will perform basic labs, try to rule out acute cause of worsening afib. No acute findings on labs. Possible pneumonia. Will admit to teaching service for continued workup. Given diltiazem, without great improvement in HR. Patient admitted in stable condition. Patient seen and evaluated by myself and my attending, Dr. Vanita Panda.      Freddi Che, MD 05/23/13 [phone]

## 2013-05-22 NOTE — H&P (Signed)
Date: 05/22/2013               Patient Name:  Travis WOLLARD Sr. MRN: [id number]  DOB: 04/22/1946 Age / Sex: 68 y.o., male   PCP: Travis Filler, MD         Medical Service: Internal Medicine Teaching Service         Attending Physician: Dr. Oval Linsey, MD    First Contact: Dr. Juluis Mire Pager: [phone]  Second Contact: Dr. Jerene Pitch Pager: [phone]       After Hours (After 5p/  First Contact Pager: [phone]  weekends / holidays): Second Contact Pager: [phone]   Chief Complaint: cough and SOB  History of Present Illness: [name] is a 68 year old male with a PMH of atrial fibrillation (on coumadin with sub-therapeutic INR and history of medicine non-compliance), HTN, CKD, COPD (on 2L home oxygen prn), tachy-brady syndrome (with pacemaker), non-ischemic cardiomyopathy and diastolic HF (EF 99-37% Dec 2014) who was recently admitted (12/29-12/31/2014) and treated for COPD exacerbation.  Today he was seen by visiting nurse and found to have a HR in 140s.  He was instructed to take an additional dose of Toprol 74m but his HR remained elevated (136) so he was instructed to go to the ED.    [name] he was asymptomatic during the episodes of tachycardia.  His main complaint is continued cough and dyspnea.  The cough has been present for over 1 month and is productive of white sputum.  He also feels more SOB than normal and has had to increase his home oxygen use and albuterol use.  He normally uses 2L home oxygen as needed but he has started using it almost every night and sometimes increases to 2.5 or 3L.  He is using his albuterol inhaler 3-4 times daily.   He also reports left-sided non-radiating, non-pleuritic CP occurring intermittently yesterday.  The pain lasted 10-15 minutes at a time and he did not try anything to relieve it.  He denies CP at present.  In the ED:  T98.11F, RR 18, SpO2 98%, HR 130, BP 148/90; Cardizem drip started  Meds: Current  Facility-Administered Medications  Medication Dose Route Frequency Provider Last Rate Last Dose  . diltiazem (CARDIZEM) 100 mg in dextrose 5 % 100 mL infusion  5-15 mg/hr Intravenous Continuous SFreddi Che MD 5 mL/hr at 05/22/13 1957 5 mg/hr at 05/22/13 1957   Current Outpatient Prescriptions  Medication Sig Dispense Refill  . albuterol (PROVENTIL HFA;VENTOLIN HFA) 108 (90 BASE) MCG/ACT inhaler Inhale 2 puffs into the lungs every 6 (six) hours as needed for wheezing or shortness of breath.      .Marland KitchenbuPROPion (WELLBUTRIN) 75 MG tablet Take 1 tablet (75 mg total) by mouth 2 (two) times daily.  60 tablet  5  . dronabinol (MARINOL) 2.5 MG capsule Take 1 capsule (2.5 mg total) by mouth 2 (two) times daily before lunch and supper.  60 capsule  0  . dronedarone (MULTAQ) 400 MG tablet Take 400 mg by mouth 2 (two) times daily with a meal.      . lisinopril (PRINIVIL,ZESTRIL) 5 MG tablet Take 1 tablet (5 mg total) by mouth daily.  90 tablet  3  . metoprolol succinate (TOPROL XL) 25 MG 24 hr tablet Take 1 tablet (25 mg total) by mouth daily.  90 tablet  3  . omeprazole (PRILOSEC) 20 MG capsule Take 40 mg by mouth daily.      .Marland KitchenoxyCODONE-acetaminophen (PERCOCET)  10-325 MG per tablet Take 1 tablet by mouth every 6 (six) hours as needed for pain.  120 tablet  0  . tiotropium (SPIRIVA) 18 MCG inhalation capsule Place 1 capsule (18 mcg total) into inhaler and inhale daily.  30 capsule  11  . warfarin (COUMADIN) 5 MG tablet Take 2.5 tablets (12.5 mg total) by mouth daily.  30 tablet  1    Allergies: Allergies as of 05/22/2013 - Review Complete 05/22/2013  Allergen Reaction Noted  . Digoxin and related  05/17/2013  . Penicillins Rash    Past Medical History  Diagnosis Date  . Hypertension   . CKD (chronic kidney disease)     Renal U/S 12/04/2009 showed no pathological findings. Labs 12/04/2009 include normal ESR, C3, C4; neg ANA; SPEP showed nonspecific increase in the alpha-2 region with no M-spike; UPEP  showed no monoclonal free light chains; urine IFE showed polyclonal increase in feree Kappa and/or free Lambda light chains. Baseline Cr reported 1.7-2.5.  [name] Chronic systolic heart failure     a. NICM - EF 35% with normal cors 2003. b. Last echo 11/2012 - EF 25-30%.  . Depression   . GERD (gastroesophageal reflux disease)   . Portal vein thrombosis   . Avascular necrosis     right hip s/p replacement  . Gastritis   . Alcohol abuse   . Erectile dysfunction   . Bipolar disorder   . Hydrocele, unspecified   . Intermittent claudication   . Lung nodule     Chest CT scan on 12/14/2008 showed a nodular opacity at the left lung base felt to most likely represent scarring.  Follow-up chest CT scan on 06/02/2010 showed parenchymal scarring in the left apex, left  lower lobe, and lingula; some of this scarring at the left base had a nodular appearance, unchanged. Chest 09/2012 - stable.  . 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  Thyroid scan 08/28/2009 was normal with no focal areas of abnormal increased or decreased activity seen; the uptake of I 131 sodium iodide at 24 hours was 5.7%.  TSH and free T4 normalized by 08/16/2009.  [name] Pneumonia   . Intestinal obstruction   . COPD (chronic obstructive pulmonary disease)   . Clostridium difficile colitis   . E coli bacteremia   . Cluster headaches   . DVT (deep venous thrombosis)     He has hypercoagulability with multiple prior DVTs  . Pleural plaque     H/o asbestos exposure. Chest CT on 06/02/2010 showed stable extensive calcified pleural plaques involving the left hemithorax, consistent with asbestos related pleural disease.  . Weight loss     Normal colonoscopy by Dr. Olevia Perches on 11/06/2010.  . Tachycardia-bradycardia syndrome     a. 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 b. St. Jude gen change 2010.  [name] PAF (paroxysmal atrial fibrillation)     Paroxysmal, failed medical therapy with amiodarone but has done very well with  multaq  . Osteoarthritis   . Spondylosis   . Chronic low back pain     "cause of my hip" (03/17/2012)  . 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  . Noncompliance   . Atrial flutter   . Thrombocytopenia    Past Surgical History  Procedure Laterality Date  . Left hip bipolar hemiarthroplasty  09/09/2002    By Dr. Lafayette Dragon  . Insert / replace /  remove pacemaker  02/2003    By Dr. Roe Rutherford, gen change by Greggory Brandy  . Total hip arthroplasty  03/08/2008    right; By Dr. Hiram Comber   Family History  Problem Relation Age of Onset  . Hypertension Mother   . Cancer Brother     Unsure of type.  . Asthma Mother   . Coronary artery disease Mother   . Colon cancer Neg Hx   . Lung cancer Neg Hx   . Prostate cancer Neg Hx    History   Social History  . Marital Status: Widowed    Spouse Name: N/A    Number of Children: 10  . Years of Education: N/A   Occupational History  . RETIRED    Social History Main Topics  . Smoking status: Current Every Day Smoker -- 0.50 packs/day for 50 years    Types: Cigarettes  . Smokeless tobacco: Never Used  . Alcohol Use: No     Comment: prior etoh abuse  . Drug Use: 1.00 per week    Special: Marijuana     Comment: prior marijuana  . Sexual Activity: Yes   Other Topics Concern  . Not on file   Social History Narrative   Works as a Architect part time   Smoker 1 pack last 1.5 days tobacco, smokes marijuana    Denies EtOH x 4 years (on 10/12/12)   12 kids    From [place]   [place] Veteran    12 grade education              Review of Systems: Constitutional: denies fevers/chills; + weight loss, + lightheadedness with standing too quickly Eyes: denies vision change Ears, nose, mouth, throat, and face: denies rhinorrhea, sore throat Respiratory: + cough and dysnea Cardiovascular: denies palpitations; +  intermittent CP Gastrointestinal:  Denies abdominal pain or diarrhea; appetite improving Genitourinary:  Denies dysuria  Physical Exam: Blood pressure 125/89, pulse 86, temperature 98.4 F (36.9 C), temperature source Oral, resp. rate 17, SpO2 99.00%. General: resting in bed in NAD watching television HEENT: PERRL, EOMI, no scleral icterus Cardiac: RRR, no rubs, murmurs or gallops, no JVD, 2+ DPs and radials Pulm:  Coarse BS particularly upper lung fields; decreased BS on LLL; no wheezing or crackles Abd: soft, nontender, nondistended, BS present Ext: warm and well perfused, trace B/L pedal edema Neuro: alert and oriented X3, cranial nerves II-XII grossly intact, 5/5 MMS upper and lower extremities  Lab results: Basic Metabolic Panel:  Recent Labs  05/22/13 1920  NA 144  K 4.1  CL 110  CO2 21  GLUCOSE 107*  BUN 25*  CREATININE 1.55*  CALCIUM 8.1*   CBC:  Recent Labs  05/22/13 1920  WBC 11.7*  HGB 14.2  HCT 42.9  MCV 94.1  PLT 175   BNP:  Recent Labs  05/22/13 1920  PROBNP 9662.0*   Coagulation:  Recent Labs  05/22/13 1920  LABPROT 16.2*  INR 1.33   Urine Drug Screen: Drugs of Abuse     Component Value Date/Time   LABOPIA POSITIVE* 10/12/2012 1724   LABOPIA NEGATIVE 12/27/2008 1943   COCAINSCRNUR NONE DETECTED 10/12/2012 1724   COCAINSCRNUR NEG 07/06/2009 2225   LABBENZ NONE DETECTED 10/12/2012 1724   LABBENZ NEG 07/06/2009 2225   AMPHETMU NONE DETECTED 10/12/2012 1724   AMPHETMU NEG 07/06/2009 2225   THCU POSITIVE* 10/12/2012 1724   LABBARB NONE DETECTED 10/12/2012 1724   Imaging results:  Dg Chest 2 View  05/22/2013  CLINICAL DATA:  Chest pain with shortness of breath. History of pacemaker and congestive heart failure.  EXAM: CHEST  2 VIEW  COMPARISON:  05/17/2013 radiographs.  CT 10/12/2012.  FINDINGS: The right subclavian pacemaker leads appear unchanged within the right ventricle. The heart size and mediastinal contours are stable. There is chronic  volume loss in the left hemithorax with parenchymal scarring, pleural thickening and pleural calcification. Compared with the prior studies, there is increased density inferiorly suspicious for superimposed airspace disease. The right lung is clear.  IMPRESSION: Increased density in the lower left hemithorax compared with prior studies, suspicious for pneumonia superimposed on chronic pleural parenchymal scarring. Radiographic follow up recommended.   Electronically Signed   By: Camie Patience M.D.   On: 05/22/2013 20:46    Assessment & Plan by Problem:  68 year old male with a PMH of atrial fibrillation (on coumadin with sub-therapeutic INR and history of medicine non-compliance), HTN, CKD, COPD, tachy-brady syndrome (with pacemaker), non-ischemic cardiomyopathy and diastolic HF (EF 03-00% Dec 2014) who presents with tachycardia and complaint of cough and SOB.  HCAP:  Cough, dyspnea and CXR concerning for LLL pneumonia.  He was recently discharged from hospital with po azithromycin (5 day total Abx course).  Cough, dyspnea and tachycardia are concerning for PE but he has no pleuritic CP and his CXR provides evidence for pneumonia as the cause of his symptoms.  However, Wells Score is 3.0 (moderate risk - 16.2% chance of PE) and his INR is sub-therapeutic.  Modified Wells Score is < 4 making PE unlikely.  As he is hemodynamically stable and the clinical picture is more consistent with PNA will continue coumadin per pharmacy and monitor persistent signs and symptoms after PNA treatment. - admit to IMTS on telemetry - oxygen supplementation and continuous pulse ox - start vancomycin and zosyn - urine strep and legionella, sputum culture  Chest pain:  He has no CP at present but reports experiencing intermittent, sharp, non-radiating left-sided CP at rest yesterday lasting 10-15 minutes at a time.  Had a normal stress test (Feb 2014).  POC troponin was negative. - CE x 3 - AM EKG  COPD exacerbation:  He  reports cough, increased sputum production and increased use of his prn oxygen and albuterol inhaler.  Now with CXR and physical exam findings concerning for pneumonia which is likely causing his exacerbation. - oxygen supplementation to keep SpO2 88-92% - ipratropium and xopenex inhalers - continue home medication, Spiriva - prednisone 19m daily  Atrial fibrillation and Tachy-brady syndrome:  Tachycardic to 140s per visiting nurse note.  He had little improvement with 233mToprol at home.  NSR on cardizem drip with rate in the 90s-low 100s in the ED.  He reports compliance with medications including coumadin (12.64m12maily), however INR sub-therapeutic since November 2014. - monitor on telemetry - Cardizem drip - coumadin per pharmacy - continue home medications:  Coumadin, dronedarone 400m34mD, Toprol XL 264mg92ZRly  Diastolic HF:  His most recent EF is 55-60% (Dec 2014) which is an improvement from previous EF 25-30% (July 2014).  His pro-BNP is elevated at 9662 and he endorses dyspnea.  However, he does not appear volume overloaded on exam (no crackles or JVD), has no edema on CXR and he has not gained weight since discharge.   - continue Toprol, lisinopril - daily weights  CKD3:  Stable.  Creatinine 1.55.  Baseline is 1.7-1.8 - monitor BMP  HTN:  Normotensive - continue home medications  VTE ppx:  Heparin and coumadin  Dispo: Disposition is deferred at this time, awaiting improvement of current medical problems. Anticipated discharge in approximately 2-3 day(s).   The patient does have a current PCP Travis Filler, MD) and does need an [facility] hospital follow-up appointment after discharge.  The patient does not know have transportation limitations that hinder transportation to clinic appointments.  Signed: Duwaine Maxin, DO 05/22/2013, 9:27 PM

## 2013-05-22 NOTE — ED Notes (Signed)
Pt reports having mid chest pain, was recently admitted to hospital and had pacemaker adjusted. Home health nurse sent pt here today due to HR >130 this afternoon. Pt reports sob, no acute resp distress noted at triage, ekg done.

## 2013-05-22 NOTE — Telephone Encounter (Signed)
[name] with [facility] called. Patient went back into AF, rates 140's, SBP 120's. Relatively asymptomatic at present but has significant history of CHF, COPD. Toprol XL was reduced from 50mg  to 25mg  last admission. I am not certain why (he was not wheezing that day but does have h/o COPD, BP was ok, has pacemaker so HR is not an issue). She states his lungs are clear today. I advised she tell him to take an extra Toprol 25mg  now and if HR not improved in 2-3 hrs to proceed to ER. He has no other AV nodal blocking agents at his disposal at home. I would be worried that rapid AF would precipitate CHF given his tenuous nature. [name] verbalized understanding of plan and will relay to patient. Geoff Dacanay PA-C

## 2013-05-22 NOTE — Progress Notes (Signed)
Anticoagulation and Antibiotics Consult Note  Pharmacy Consult for Vancomycin and zosyn Indication: pneumonia  Pharmacy Consult for warfarin  Indication: Afib   Allergies  Allergen Reactions  . Digoxin And Related     Dig toxicity 2009  . Penicillins Rash    Patient Measurements: Height: 6' 3.2" (191 cm) Weight: 138 lb 14.2 oz (63 kg) IBW/kg (Calculated) : 84.95   Vital Signs: Temp: 98.4 F (36.9 C) (01/03 1904) Temp src: Oral (01/03 1904) BP: 121/80 mmHg (01/03 2130) Pulse Rate: 91 (01/03 2130)  Labs:  Recent Labs  05/22/13 1920  HGB 14.2  HCT 42.9  PLT 175  LABPROT 16.2*  INR 1.33  CREATININE 1.55*    Estimated Creatinine Clearance: 40.1 ml/min (by C-G formula based on Cr of 1.55).   Medical History: Past Medical History  Diagnosis Date  . Hypertension   . CKD (chronic kidney disease)     Renal U/S 12/04/2009 showed no pathological findings. Labs 12/04/2009 include normal ESR, C3, C4; neg ANA; SPEP showed nonspecific increase in the alpha-2 region with no M-spike; UPEP showed no monoclonal free light chains; urine IFE showed polyclonal increase in feree Kappa and/or free Lambda light chains. Baseline Cr reported 1.7-2.5.  . Chronic systolic heart failure     a. NICM - EF 35% with normal cors 2003. b. Last echo 11/2012 - EF 25-30%.  . Depression   . GERD (gastroesophageal reflux disease)   . Portal vein thrombosis   . Avascular necrosis     right hip s/p replacement  . Gastritis   . Alcohol abuse   . Erectile dysfunction   . Bipolar disorder   . Hydrocele, unspecified   . Intermittent claudication   . Lung nodule     Chest CT scan on 12/14/2008 showed a nodular opacity at the left lung base felt to most likely represent scarring.  Follow-up chest CT scan on 06/02/2010 showed parenchymal scarring in the left apex, left  lower lobe, and lingula; some of this scarring at the left base had a nodular appearance, unchanged. Chest 09/2012 - stable.  .  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  Thyroid scan 08/28/2009 was normal with no focal areas of abnormal increased or decreased activity seen; the uptake of I 131 sodium iodide at 24 hours was 5.7%.  TSH and free T4 normalized by 08/16/2009.  . Pneumonia   . Intestinal obstruction   . COPD (chronic obstructive pulmonary disease)   . Clostridium difficile colitis   . E coli bacteremia   . Cluster headaches   . DVT (deep venous thrombosis)     He has hypercoagulability with multiple prior DVTs  . Pleural plaque     H/o asbestos exposure. Chest CT on 06/02/2010 showed stable extensive calcified pleural plaques involving the left hemithorax, consistent with asbestos related pleural disease.  . Weight loss     Normal colonoscopy by Dr. Brodie on 11/06/2010.  . Tachycardia-bradycardia syndrome     a. 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 b. St. Jude gen change 2010.  . PAF (paroxysmal atrial fibrillation)     Paroxysmal, failed medical therapy with amiodarone but has done very well with multaq  . Osteoarthritis   . Spondylosis   . Chronic low back pain     "cause of my hip" (03/17/2012)  . 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  . Noncompliance   .   Atrial flutter   . Thrombocytopenia     Medications:  Warfarin 12.51m daily pta  Assessment: 68year old male who was seen by an outpatient RN and found to have elevated HR (afib) and chest pain/shortness of breath. Patient is on chronic coumadin with subtherapeutic INR(noncompliance in past). Antibiotics ordered for possible pna. No fevers, wbc 11.7, will adjust abx based on renal function.  Goal of Therapy:  INR 2-3 Monitor platelets by anticoagulation protocol: Yes Vancomycin goal 15-20   Plan:  Warfarin 158mtonight-daily INR Zosyn 3.375g IV q8 - infuse over 4h Vancomycin 100025m24  hours  FraErin HearingarmD., BCPS Clinical Pharmacist Pager [phone] 10:18 PM

## 2013-05-23 DIAGNOSIS — I10 Essential (primary) hypertension: Secondary | ICD-10-CM

## 2013-05-23 DIAGNOSIS — I495 Sick sinus syndrome: Secondary | ICD-10-CM

## 2013-05-23 DIAGNOSIS — J4489 Other specified chronic obstructive pulmonary disease: Secondary | ICD-10-CM

## 2013-05-23 DIAGNOSIS — J449 Chronic obstructive pulmonary disease, unspecified: Secondary | ICD-10-CM

## 2013-05-23 LAB — TROPONIN I: Troponin I: <0.3 ng/mL (ref <0.30)

## 2013-05-23 LAB — STREP PNEUMONIAE URINARY ANTIGEN: Strep Pneumo Urinary Antigen: POSITIVE — AB

## 2013-05-23 LAB — PROTIME-INR
INR: 2.17 — AB (ref 0.00–1.49)
Prothrombin Time: 23.5 seconds — ABNORMAL HIGH (ref 11.6–15.2)

## 2013-05-23 MED ORDER — IPRATROPIUM BROMIDE HFA 17 MCG/ACT IN AERS
2.0000 | INHALATION_SPRAY | Freq: Four times a day (QID) | RESPIRATORY_TRACT | Status: DC | PRN
  Filled 2013-05-23: qty 12.9

## 2013-05-23 MED ORDER — LEVALBUTEROL TARTRATE 45 MCG/ACT IN AERO
2.0000 | INHALATION_SPRAY | Freq: Four times a day (QID) | RESPIRATORY_TRACT | Status: DC | PRN
  Administered 2013-05-25: 2 via RESPIRATORY_TRACT
  Filled 2013-05-23: qty 15

## 2013-05-23 MED ORDER — DOXYCYCLINE HYCLATE 100 MG PO TABS
100.0000 mg | ORAL_TABLET | Freq: Two times a day (BID) | ORAL | Status: DC
  Administered 2013-05-23 – 2013-05-27 (×10): 100 mg via ORAL
  Filled 2013-05-23 (×12): qty 1

## 2013-05-23 MED ORDER — METOPROLOL SUCCINATE ER 50 MG PO TB24
50.0000 mg | ORAL_TABLET | Freq: Every day | ORAL | Status: DC
  Administered 2013-05-23 – 2013-05-27 (×5): 50 mg via ORAL
  Filled 2013-05-23 (×6): qty 1

## 2013-05-23 NOTE — Progress Notes (Signed)
Per pt request, pt refuses to be bothered between 12 am -6 am.  Pt states he is willing to allow labs and VS after 6 am but not a moment before as he needs his rest to heal.  Pt educated on importance of being checked on and labs being drawn in the morning, pt stated he understood but still does not want to be bothered.  Call light in reach, RN will continue to monitor.    Nolon Nations, RN

## 2013-05-23 NOTE — ED Provider Notes (Signed)
This patient was seen in conjunction with the resident physician, Dr. Silvio Clayman. The documentation is accurate and reflects our evaluation of the patient, and the clinical course.  I have seen the relevant labs, x-ray, EKG.  Patient's labs are notable for elevated BNP.  This in addition the patient's x-ray findings require some further evaluation as an inpatient  On my exam: This patient was tachycardic, with a U. regular rhythm concern for atrial fibrillation with rapid ventricular response.  I have seen EKG, agree with the interpretation. Patient does have history of atrial fibrillation, but typically has appropriate rate control.  Patient had a chest pain as well, though on my evaluation the pain seemed minimal. Given the patient's atrial fibrillation with rapid ventricular response required initiation of a diltiazem drip.  He tolerated this well, though there was marginal improvement in his heart rate.  However, his pain improved, and on admission the patient was pain-free.  CRITICAL CARE Performed by: Carmin Muskrat Total critical care time: 30 Critical care time was exclusive of separately billable procedures and treating other patients. Critical care was necessary to treat or prevent imminent or life-threatening deterioration. Critical care was time spent personally by me on the following activities: development of treatment plan with patient and/or surrogate as well as nursing, discussions with consultants, evaluation of patient's response to treatment, examination of patient, obtaining history from patient or surrogate, ordering and performing treatments and interventions, ordering and review of laboratory studies, ordering and review of radiographic studies, pulse oximetry and re-evaluation of patient's condition.   Carmin Muskrat, MD 05/23/13 [phone]

## 2013-05-23 NOTE — Progress Notes (Signed)
Patient refuses to have anything done to him till after 7:30 am.

## 2013-05-23 NOTE — H&P (Signed)
Internal Medicine Attending Admission Note Date:  05/23/2013  Patient name: Travis PALKA Sr. Medical record number: [id number] Date of birth: [birth year] Age: 68 y.o. Gender: male  I saw and evaluated the patient. I reviewed the resident's note and I agree with the resident's findings and plan as documented in the resident's note with the following modifications:  [name] is a 68 year old man with a history of atrial fibrillation complicated by tachy-brady syndrome requiring pacemaker placement, chronic diastolic heart failure, hypertension, stage III chronic kidney disease, and chronic obstructive pulmonary disease who was recently discharged from the hospital after treatment for a COPD exacerbation. Apparently during that visit, some of his symptoms were felt to be secondary to excessive beta blockade with inadequate pacemaker backup. His pacemaker apparently was readjusted at that time and his metoprolol dose was decreased to 25 mg by mouth twice a day. He states he was asked to come to the hospital because the visiting nurse found his heart rate in the 130s and 140s. He otherwise felt close to baseline and would not have come unless he was instructed to do so. Upon arrival to the emergency department he was started on a diltiazem drip for this tachycardia. He was admitted to the internal medicine teaching service for further evaluation and care.  On my history this morning, he states his main issue is the tachycardia which he feels. When his heart is palpating he notes concomitant shortness of breath. When the palpitations cease his breathing improves. Physical examination reveals an irregularly irregular rhythm with an intermittently tachycardic heart rate. I do not appreciate any wheezes. Review of his chest x-ray reveals chronic left sided pleural thickening with calcification and parenchymal scarring that to my eye does not appear significantly different from previous chest x-rays. He has a mild  leukocytosis at 11.7 and no differential was done. Of note, he just finished his prednisone therapy for his COPD exacerbation yesterday. His pro BNP was 9662 which is up from just over 6000 previously.  It is my impression that Mr. Cruey symptoms are more consistent with his atrial flutter with variable rate that is tachycardic intermitently. The likely cause of this is the decrease in his rate control medication during the previous admission. The reasons for this at the time makes sense although adjustments were reportedly done to the pacemaker as well. I believe that an increase back to his baseline metoprolol dose of 50 mg by mouth twice a day with the new floor set on the pacemaker in terms of rate should allow [place] to control the tachyarrhythmias without causing symptomatic bradycardia. Hopefully, this will allow [place] to rapidly wean off the diltiazem drip. Although very difficult to interpret, I do not believe the chronic left-sided changes seen on chest x-ray are significantly different. This, in light of the history and physical examination would suggest that a hospital-acquired pneumonia is not likely occurring at this time. He may not have completely recovered from his COPD exacerbation and does have a cough consistent with a bronchitis. Therefore he may benefit from treatment for his bronchitis with doxycycline 100 mg by mouth twice a day for 5 days paying careful attention to the INR. I do not believe he requires vancomycin and Zosyn at this time and we will stop these medications and follow him closely clinically. If his symptoms resolve with control of his heart rate then we can be assured this was the cause of his dyspnea. If his symptoms do not resolve with resolution of his tachy-arrhythmia  we can revisit the issue of a new pneumonia. I anticipate Mr. Cozzens will require inpatient services for several days as we control his heart rate and work out the underlying reasons for his symptoms.

## 2013-05-23 NOTE — Progress Notes (Signed)
Anticoagulation and Antibiotics Consult Note  Pharmacy Consult for Vancomycin and zosyn Indication: pneumonia  Pharmacy Consult for warfarin  Indication: Afib   Allergies  Allergen Reactions  . Digoxin And Related     Dig toxicity 2009  . Penicillins Rash    Patient Measurements: Height: '6\' 3"'  (190.5 cm) Weight: 156 lb 4.9 oz (70.9 kg) IBW/kg (Calculated) : 84.5   Vital Signs: Temp: 98.3 F (36.8 C) (01/04 0405) Temp src: Oral (01/04 0405) BP: 110/89 mmHg (01/04 0407) Pulse Rate: 97 (01/04 0407)  Labs:  Recent Labs  05/22/13 1920 05/23/13 0800  HGB 14.2  --   HCT 42.9  --   PLT 175  --   LABPROT 16.2* 23.5*  INR 1.33 2.17*  CREATININE 1.55*  --   TROPONINI  --  <0.30    Estimated Creatinine Clearance: 45.1 ml/min (by C-G formula based on Cr of 1.55).   Medical History: Past Medical History  Diagnosis Date  . Hypertension   . CKD (chronic kidney disease)     Renal U/S 12/04/2009 showed no pathological findings. Labs 12/04/2009 include normal ESR, C3, C4; neg ANA; SPEP showed nonspecific increase in the alpha-2 region with no M-spike; UPEP showed no monoclonal free light chains; urine IFE showed polyclonal increase in feree Kappa and/or free Lambda light chains. Baseline Cr reported 1.7-2.5.  [name] Chronic systolic heart failure     a. NICM - EF 35% with normal cors 2003. b. Last echo 11/2012 - EF 25-30%.  . Depression   . GERD (gastroesophageal reflux disease)   . Portal vein thrombosis   . Avascular necrosis     right hip s/p replacement  . Gastritis   . Alcohol abuse   . Erectile dysfunction   . Bipolar disorder   . Hydrocele, unspecified   . Intermittent claudication   . Lung nodule     Chest CT scan on 12/14/2008 showed a nodular opacity at the left lung base felt to most likely represent scarring.  Follow-up chest CT scan on 06/02/2010 showed parenchymal scarring in the left apex, left  lower lobe, and lingula; some of this scarring at the left base had  a nodular appearance, unchanged. Chest 09/2012 - stable.  . 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  Thyroid scan 08/28/2009 was normal with no focal areas of abnormal increased or decreased activity seen; the uptake of I 131 sodium iodide at 24 hours was 5.7%.  TSH and free T4 normalized by 08/16/2009.  [name] Pneumonia   . Intestinal obstruction   . COPD (chronic obstructive pulmonary disease)   . Clostridium difficile colitis   . E coli bacteremia   . Cluster headaches   . DVT (deep venous thrombosis)     He has hypercoagulability with multiple prior DVTs  . Pleural plaque     H/o asbestos exposure. Chest CT on 06/02/2010 showed stable extensive calcified pleural plaques involving the left hemithorax, consistent with asbestos related pleural disease.  . Weight loss     Normal colonoscopy by Dr. Olevia Perches on 11/06/2010.  . Tachycardia-bradycardia syndrome     a. 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 b. [employer] gen change 2010.  [name] PAF (paroxysmal atrial fibrillation)     Paroxysmal, failed medical therapy with amiodarone but has done very well with multaq  . Osteoarthritis   . Spondylosis   . Chronic low back pain     "cause of my hip" (03/17/2012)  . 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  . Noncompliance   . Atrial flutter   . Thrombocytopenia     Medications:  Warfarin 12.47m daily pta  Assessment: 68year old male who was seen by an outpatient RN and found to have elevated HR (afib) and chest pain/shortness of breath. Patient is on chronic coumadin with subtherapeutic INR(noncompliance in past). Antibiotics ordered for possible pna.  Afib - remains on diltiazem gtt at 10, INR significantly up overnight 1.3>>2.1. No bleeding noted, question compliance. Will hold warfarin tonight and d/c sq heparin. Question in liver function involvement, pt  also on multaq with can cause elevated lft's. Will order cmet for am to assess lft's and renal function while on possible nephrotoxic agents like vancomycin.  Pneumonia: no fevers noted, no cbc this am, no culture data, also on steroids. Refusing some care. Will continue antibiotics at current dose. Again check renal function in am if trend is stable will only need scr weekly.  Goal of Therapy:  INR 2-3 Monitor platelets by anticoagulation protocol: Yes Vancomycin goal 15-20   Plan:  Hold Warfarin tonight-daily INR Zosyn 3.375g IV q8 - infuse over 4h Vancomycin [id number] hours  FrErin HearingharmD., BCPS Clinical Pharmacist Pager [phone]/08/2013 10:51 AM

## 2013-05-23 NOTE — Progress Notes (Signed)
Patient refused to have 0130 Troponin drawn.

## 2013-05-23 NOTE — Progress Notes (Addendum)
Pt heart rate once again began sustaining a rate of 140-145 bpm.  Cardizem drip was titrated up to 10 mg/hr.  Pt HR still maintained 130-135 bpm.  Cardizem drip was again titrated to 15 mg/hr.  This brought the pt's HR down to 90-105 bpm.  Pt continues to show A-flutter on telemetry.  Will continue to monitor.  MD was notified.

## 2013-05-23 NOTE — Progress Notes (Signed)
Spoke with MD, who okay'd the drip to be titrated up to 20 mg/hr according to the order.  Will continue to monitor.

## 2013-05-23 NOTE — Progress Notes (Addendum)
Subjective: [name] was seen and examined at bedside. He reports improvement since admission in regards to his heart rate but shortly after his HR increased to 120's again and he felt SOB and was placed back on [place] O2. He denies any chest pain, continues to have chronic cough, and says he came to the ED because the nurse told him too. He does endorse feeling tired when his HR gets very fast or very slow. He has been complaint with the medications.   Objective: Vital signs in last 24 hours: Filed Vitals:   05/22/13 2310 05/23/13 0405 05/23/13 0407 05/23/13 1427  BP: 152/96 117/74 110/89 107/79  Pulse: 109 79 97 121  Temp: 97.7 F (36.5 C) 98.3 F (36.8 C)  98.1 F (36.7 C)  TempSrc: Oral Oral  Oral  Resp: 18 20 19 16   Height: 6\' 3"  (1.905 m)     Weight: 156 lb 4.9 oz (70.9 kg)     SpO2: 98% 100%  95%   Weight change:   Intake/Output Summary (Last 24 hours) at 05/23/13 1655 Last data filed at 05/23/13 1300  Gross per 24 hour  Intake    270 ml  Output    725 ml  Net   -455 ml   Vitals reviewed. General: resting in bed, NAD, on [place] 2L O2 HEENT: PERRL, EOMI Cardiac: tachycardia, irregularly irregular Pulm: b/l rhonchi Abd: soft, nontender, nondistended, BS present Ext: warm and well perfused, no pedal edema Neuro: alert and oriented X3, cranial nerves II-XII grossly intact, strength equal in bilateral upper and lower extremities  Lab Results: Basic Metabolic Panel:  Recent Labs Lab 05/19/13 0530 05/22/13 1920  NA 146 144  K 5.1 4.1  CL 110 110  CO2 25 21  GLUCOSE 85 107*  BUN 28* 25*  CREATININE 1.67* 1.55*  CALCIUM 8.8 8.1*   CBC:  Recent Labs Lab 05/19/13 0530 05/22/13 1920  WBC 11.0* 11.7*  HGB 12.9* 14.2  HCT 38.3* 42.9  MCV 92.1 94.1  PLT 126* 175   Cardiac Enzymes:  Recent Labs Lab 05/17/13 1945 05/18/13 0030 05/23/13 0800  TROPONINI <0.30 <0.30 <0.30   BNP:  Recent Labs Lab 05/17/13 1225 05/22/13 1920  PROBNP 6363.0* 9662.0*    CBG:  Recent Labs Lab 05/18/13 0622 05/19/13 0636  GLUCAP 138* 97   Coagulation:  Recent Labs Lab 05/18/13 0910 05/19/13 0530 05/22/13 1920 05/23/13 0800  LABPROT 13.7 11.8 16.2* 23.5*  INR 1.07 0.88 1.33 2.17*   Urine Drug Screen: Drugs of Abuse     Component Value Date/Time   LABOPIA POSITIVE* 10/12/2012 1724   LABOPIA NEGATIVE 12/27/2008 1943   COCAINSCRNUR NONE DETECTED 10/12/2012 1724   COCAINSCRNUR NEG 07/06/2009 2225   LABBENZ NONE DETECTED 10/12/2012 1724   LABBENZ NEG 07/06/2009 2225   AMPHETMU NONE DETECTED 10/12/2012 1724   AMPHETMU NEG 07/06/2009 2225   THCU POSITIVE* 10/12/2012 1724   LABBARB NONE DETECTED 10/12/2012 1724    Studies/Results: Dg Chest 2 View  05/22/2013   CLINICAL DATA:  Chest pain with shortness of breath. History of pacemaker and congestive heart failure.  EXAM: CHEST  2 VIEW  COMPARISON:  05/17/2013 radiographs.  CT 10/12/2012.  FINDINGS: The right subclavian pacemaker leads appear unchanged within the right ventricle. The heart size and mediastinal contours are stable. There is chronic volume loss in the left hemithorax with parenchymal scarring, pleural thickening and pleural calcification. Compared with the prior studies, there is increased density inferiorly suspicious for superimposed airspace disease. The  right lung is clear.  IMPRESSION: Increased density in the lower left hemithorax compared with prior studies, suspicious for pneumonia superimposed on chronic pleural parenchymal scarring. Radiographic follow up recommended.   Electronically Signed   By: Camie Patience M.D.   On: 05/22/2013 20:46   Medications: I have reviewed the patient's current medications. Scheduled Meds: . buPROPion  75 mg Oral BID  . doxycycline  100 mg Oral Q12H  . dronabinol  2.5 mg Oral BID AC  . dronedarone  400 mg Oral BID WC  . lisinopril  5 mg Oral Daily  . metoprolol succinate  50 mg Oral Daily  . nicotine  7 mg Transdermal Daily  . pantoprazole  40 mg Oral  Daily  . predniSONE  40 mg Oral Q breakfast  . sodium chloride  3 mL Intravenous Q12H  . Warfarin - Pharmacist Dosing Inpatient   Does not apply q1800   Continuous Infusions: . diltiazem (CARDIZEM) infusion 10 mg/hr (05/23/13 0618)   PRN Meds:.ipratropium, levalbuterol, oxyCODONE, oxyCODONE-acetaminophen Assessment/Plan: Principal Problem:   COPD exacerbation Active Problems:   TOBACCO ABUSE   HYPERTENSION   PAF (paroxysmal atrial fibrillation) on chronic coumadin   COPD (chronic obstructive pulmonary disease)   Non-ischemic cardiomyopathy   BRADYCARDIA-TACHYCARDIA SYNDROME   G E R D   OSTEOARTHRITIS   HYPERTHYROIDISM   Chronic kidney disease   Chronic combined systolic and diastolic congestive heart failure   Atrial fibrillation [name] is a 68 year old male with PMH of Atrial fibrillation (on coumadin with sub-therapeutic INR), HTN, CKD 3, COPD, tachy-brady syndrome (with pacemaker), non-ischemic cardiomyopathy, and diastolic HF (EF 61-95% Dec 2014) admitted for Atrial flutter/fib RVR and ?HCAP   COPD exacerbation with likely bronchitis:  - start vancomycin and zosyn initially admitted for ?HCAP with increased density in left lower hemithorax on CXR, but not very different from prior admission.  He has chronic cough and occasional SOB but o2 saturation stable 95% on room air.  He was recently discharged from the hospital on 05/19/13 with total 5 day course of Azithromycin and prednisone.  HCAP seems unlikely giving chronicity of CXR and relatively unchanged respiratory symptoms on history on and physical exam. Urine Strep pneumoniae was positive this admission.  Thus, he has been transitioned off IV antibiotics and transitioned to PO antibiotics with Doxycycline for treatment of acute bronchitis that could be leading to exacerbation.   - will continue to monitor on tele given intermittent tachycardia and atrial flutter.  - urine legionella Ag, sputum culture not collected yet -  continue doxycycline 100mg  po bid x5 days total -  O2 PRN, keep o2 sats >92% - xopenex and atrovent inhalers q6 hours prn - continue Spiriva - will discontinue prednisone at this time since he has completed his course prior 5 day course.   Atrial flutter with intermittent tachycardia--HR 120's today on cardizem gtt and maintained despite increasing his BB back to prior dose, Toprol XL 50mg  daily. Possibly secondary to decrease in BB last admission due to bradycardia (he has a history of trachy-brady syndrome) vs. 2/2 COPD exacerbation.  His pacemaker was interrogated by cardiology during that admission and adjustments were made.  Hopefully with the increase in his BB, his HR will slow down and stabilize. -will continue dilt gtt for now, given HR remains in 120's with hopes to slowly transition off to PO only. -continue coumadin per pharmacy, INR therapeutic today 2.17    -trend INR -will check TSH and fT4 given hx of hyperthyroidism; last  TSH 4.2 11/2012  Chest pain: Resolved and none during this admission but did have some day prior to admission. CE x2 negative. Patient is refusing further lab collection at this time.   Diastolic HF: EF is 0000000 (Dec 2014).  Stable and does not appear volume overloaded on exam.  - continue Toprol (increased from 25-50mg ) and Lisinopril (home dose, 5mg  qd) - daily weights   CKD3: Stable. Creatinine 1.55. Baseline is 1.7-1.8  - monitor BMP   HTN: BP stable, on BB, ACEi, and cardizem gtt at this time.  - continue home medications  - will try to titrate off cardizem gtt   Diet: Regular DVT Ppx: Coumadin Dispo: Disposition is deferred at this time, awaiting improvement of current medical problems.  Anticipated discharge in approximately 2-3 day(s).   The patient does have a current PCP Axel Filler, MD) and does need an [facility] hospital follow-up appointment after discharge.  The patient does not have transportation limitations that hinder  transportation to clinic appointments.  Services Needed at time of discharge: Y = Yes, Blank = No PT:   OT:   RN:   Equipment:   Other:     LOS: 1 day   Jerene Pitch, MD 05/23/2013, 4:55 PM

## 2013-05-24 ENCOUNTER — Ambulatory Visit: Payer: Medicare Other

## 2013-05-24 LAB — COMPREHENSIVE METABOLIC PANEL
ALT: 22 U/L (ref 0–53)
AST: 18 U/L (ref 0–37)
Albumin: 3 g/dL — ABNORMAL LOW (ref 3.5–5.2)
Alkaline Phosphatase: 58 U/L (ref 39–117)
BUN: 25 mg/dL — ABNORMAL HIGH (ref 6–23)
CALCIUM: 8.7 mg/dL (ref 8.4–10.5)
CO2: 23 mEq/L (ref 19–32)
CREATININE: 1.48 mg/dL — AB (ref 0.50–1.35)
Chloride: 104 mEq/L (ref 96–112)
GFR, EST AFRICAN AMERICAN: 54 mL/min — AB (ref >90)
GFR, EST NON AFRICAN AMERICAN: 47 mL/min — AB (ref >90)
GLUCOSE: 84 mg/dL (ref 70–99)
Potassium: 5.5 mEq/L — ABNORMAL HIGH (ref 3.7–5.3)
SODIUM: 140 meq/L (ref 137–147)
Total Bilirubin: 0.2 mg/dL — ABNORMAL LOW (ref 0.3–1.2)
Total Protein: 6.3 g/dL (ref 6.0–8.3)

## 2013-05-24 LAB — CBC
HEMATOCRIT: 38.3 % — AB (ref 39.0–52.0)
HEMOGLOBIN: 12.8 g/dL — AB (ref 13.0–17.0)
MCH: 30.6 pg (ref 26.0–34.0)
MCHC: 33.4 g/dL (ref 30.0–36.0)
MCV: 91.6 fL (ref 78.0–100.0)
PLATELETS: 175 10*3/uL (ref 150–400)
RBC: 4.18 MIL/uL — AB (ref 4.22–5.81)
RDW: 15.5 % (ref 11.5–15.5)
WBC: 11.6 10*3/uL — ABNORMAL HIGH (ref 4.0–10.5)

## 2013-05-24 LAB — LEGIONELLA ANTIGEN, URINE: Legionella Antigen, Urine: NEGATIVE

## 2013-05-24 LAB — PROTIME-INR
INR: 2.62 — AB (ref 0.00–1.49)
Prothrombin Time: 27.1 seconds — ABNORMAL HIGH (ref 11.6–15.2)

## 2013-05-24 LAB — T4, FREE: Free T4: 1.55 ng/dL (ref 0.80–1.80)

## 2013-05-24 LAB — TSH: TSH: 1.999 u[IU]/mL (ref 0.350–4.500)

## 2013-05-24 MED ORDER — HYDROCORTISONE 0.5 % EX CREA
TOPICAL_CREAM | Freq: Two times a day (BID) | CUTANEOUS | Status: DC
  Administered 2013-05-24 – 2013-05-30 (×7): via TOPICAL
  Filled 2013-05-24: qty 28.35

## 2013-05-24 MED ORDER — ZOLPIDEM TARTRATE 5 MG PO TABS
5.0000 mg | ORAL_TABLET | Freq: Every evening | ORAL | Status: DC | PRN
  Administered 2013-05-24: 5 mg via ORAL
  Filled 2013-05-24: qty 1

## 2013-05-24 MED ORDER — SODIUM POLYSTYRENE SULFONATE 15 GM/60ML PO SUSP
15.0000 g | Freq: Once | ORAL | Status: AC
  Administered 2013-05-24: 15 g via ORAL
  Filled 2013-05-24: qty 60

## 2013-05-24 NOTE — Progress Notes (Signed)
Pt had large incontinent bm and was in the bathroom stating, "I am getting a shower right now no matter what."  Pt was educated on why we would rather he do a bath so we could leave him on telemetry but the patient refused a bath and is noncompliant with leaving telemetry on.  He remained pleasant during this conversation.  He started removing telemetry and got in the shower.  Pt allowed me to wrap up his IV before he started the water.  Will replace telemetry once pt is done showering.

## 2013-05-24 NOTE — Progress Notes (Signed)
Utilization Review Completed.Kanisha Duba T1/09/2013  

## 2013-05-24 NOTE — Progress Notes (Signed)
  Date: 05/24/2013  Patient name: Travis TUTTEROW Sr.  Medical record number: [id number]  Date of birth: February 18, 1946   This patient has been seen and the plan of care was discussed with the house staff. Please see their note for complete details. I concur with their findings with the following additions/corrections:  [name] somewhat improved this morning.  On increased dose of metoprolol.   Sid Falcon, MD 05/24/2013, 4:44 PM

## 2013-05-24 NOTE — Progress Notes (Addendum)
Subjective:  Pt seen and examined in AM. No acute events overnight. Pt reports no chest pain but at times fluttering. He reports improving cough and dyspnea. He was able to ambulate without lightheadedness. Pt denies fever, chills, headache, nausea, vomiting, change in BM or urination. He reports having difficulty sleeping last night.     Objective: Vital signs in last 24 hours: Filed Vitals:   05/23/13 1819 05/23/13 1956 05/24/13 0545 05/24/13 1300  BP: 120/90 116/85 127/75 136/88  Pulse: 115 119 80 75  Temp:  98.7 F (37.1 C) 98 F (36.7 C) 98.7 F (37.1 C)  TempSrc:  Oral Oral Oral  Resp:  17 18 20   Height:      Weight:      SpO2:  100% 100% 99%   Weight change:   Intake/Output Summary (Last 24 hours) at 05/24/13 1430 Last data filed at 05/24/13 1300  Gross per 24 hour  Intake    360 ml  Output   1350 ml  Net   -990 ml   Vitals reviewed. General: resting in bed, NAD, on [place] 2L O2 HEENT: PERRL, EOMI Cardiac: normal rate, irregularly irregular Pulm: b/l rhonchi Abd: soft, nontender, nondistended, BS present Ext: warm and well perfused, no pedal edema Neuro: alert and oriented X3, cranial nerves II-XII grossly intact, strength equal in bilateral upper and lower extremities  Lab Results: Basic Metabolic Panel:  Recent Labs Lab 05/22/13 1920 05/24/13 0555  NA 144 140  K 4.1 5.5*  CL 110 104  CO2 21 23  GLUCOSE 107* 84  BUN 25* 25*  CREATININE 1.55* 1.48*  CALCIUM 8.1* 8.7   CBC:  Recent Labs Lab 05/22/13 1920 05/24/13 0555  WBC 11.7* 11.6*  HGB 14.2 12.8*  HCT 42.9 38.3*  MCV 94.1 91.6  PLT 175 175   Cardiac Enzymes:  Recent Labs Lab 05/17/13 1945 05/18/13 0030 05/23/13 0800  TROPONINI <0.30 <0.30 <0.30   BNP:  Recent Labs Lab 05/22/13 1920  PROBNP 9662.0*   CBG:  Recent Labs Lab 05/18/13 0622 05/19/13 0636  GLUCAP 138* 97   Coagulation:  Recent Labs Lab 05/19/13 0530 05/22/13 1920 05/23/13 0800 05/24/13 0555  LABPROT  11.8 16.2* 23.5* 27.1*  INR 0.88 1.33 2.17* 2.62*   Urine Drug Screen: Drugs of Abuse     Component Value Date/Time   LABOPIA POSITIVE* 10/12/2012 1724   LABOPIA NEGATIVE 12/27/2008 1943   COCAINSCRNUR NONE DETECTED 10/12/2012 1724   COCAINSCRNUR NEG 07/06/2009 2225   LABBENZ NONE DETECTED 10/12/2012 1724   LABBENZ NEG 07/06/2009 2225   AMPHETMU NONE DETECTED 10/12/2012 1724   AMPHETMU NEG 07/06/2009 2225   THCU POSITIVE* 10/12/2012 1724   LABBARB NONE DETECTED 10/12/2012 1724    Studies/Results: Dg Chest 2 View  05/22/2013   CLINICAL DATA:  Chest pain with shortness of breath. History of pacemaker and congestive heart failure.  EXAM: CHEST  2 VIEW  COMPARISON:  05/17/2013 radiographs.  CT 10/12/2012.  FINDINGS: The right subclavian pacemaker leads appear unchanged within the right ventricle. The heart size and mediastinal contours are stable. There is chronic volume loss in the left hemithorax with parenchymal scarring, pleural thickening and pleural calcification. Compared with the prior studies, there is increased density inferiorly suspicious for superimposed airspace disease. The right lung is clear.  IMPRESSION: Increased density in the lower left hemithorax compared with prior studies, suspicious for pneumonia superimposed on chronic pleural parenchymal scarring. Radiographic follow up recommended.   Electronically Signed   By: Camie Patience  M.D.   On: 05/22/2013 20:46   Medications: I have reviewed the patient's current medications. Scheduled Meds: . buPROPion  75 mg Oral BID  . doxycycline  100 mg Oral Q12H  . dronabinol  2.5 mg Oral BID AC  . dronedarone  400 mg Oral BID WC  . lisinopril  5 mg Oral Daily  . metoprolol succinate  50 mg Oral Daily  . nicotine  7 mg Transdermal Daily  . pantoprazole  40 mg Oral Daily  . sodium chloride  3 mL Intravenous Q12H  . Warfarin - Pharmacist Dosing Inpatient   Does not apply q1800   Continuous Infusions:   PRN Meds:.ipratropium, levalbuterol,  oxyCODONE, oxyCODONE-acetaminophen Assessment/Plan: Principal Problem:   COPD exacerbation Active Problems:   TOBACCO ABUSE   HYPERTENSION   PAF (paroxysmal atrial fibrillation) on chronic coumadin   BRADYCARDIA-TACHYCARDIA SYNDROME   COPD (chronic obstructive pulmonary disease)   G E R D   OSTEOARTHRITIS   HYPERTHYROIDISM   Chronic kidney disease   Chronic combined systolic and diastolic congestive heart failure   Non-ischemic cardiomyopathy   Atrial fibrillation  Assessment: Mr. Ghan is a 68 year old male with PMH of Atrial fibrillation (on coumadin with sub-therapeutic INR), HTN, CKD 3, COPD, tachy-brady syndrome (with pacemaker), non-ischemic cardiomyopathy, and diastolic HF (EF 0000000 Dec 2014) admitted for atrial flutter/fib RVR/   Plan:   Atrial flutter in setting of tachy-brady syndrome--currently in atrial flutter, HR 75. Most likely due to decrease in metoprolol from 50 to 25 mg daily per cardiology recommendation and re-setting of pacemaker on last admission.  -Continue metoprolol succinate 50 mg daily  -Continue dronedarone 400 mg BID  -Continue coumadin per pharmacy and monitoring INR, therapeutic today 2.62  -Obtain TSH/T4 --> WNL  -Continue to monitor on telemetry  Hyperkalemia - Pt with potassium of 5.5 today -Administer kayexalate  -continue to monitor   COPD Mild Exacerbation - improving. Pt with chronic cough and dyspnea with exertion s/p 5 day azithromycin and prednisone course.  HCAP unlikely giving chronicity of CXR and relatively unchanged respiratory symptoms from previous admission. Pt with +urine strep pneumoniae.    - Urine legionella Ag--> negative - Awaiting sputum culture  - Continue doxycycline 100 mg po bid Day 2/5 - Oxygen therapy to keep SpO2 >92% - Breathing treatment Q6 hr - Continue home Spiriva daily    Leukocytosis - Most likely due to recent corticosteroid course. Pt also with +Strep Pneum urine antigen -Continue to monitor  CBC  Combined Systolic and Diastolic CHF - Pt with 2D-echo on 12/30 with improved systolic function EF 0000000, no wall motion, and inability to assess diastolic function. Pt without signs of volume overload on exam or CXR.  -Continue metoprolol succinate 50 mg daily  -Monitor daily weight and I & O's (2L out yesterday)  Chronic Kidney Disease Stage 3 - Pt with with Cr 1.48 below baseline Cr 2.0 -Avoid nephrotoxins -Continue to monitor renal function  Hypertension - currently normotensive  -Continue home lisinopril 5 mg daily  -Continue 50 mg metoprolol succinate    Severe Malnutrition - Pt with BMI of 17.36 indicating underweight and meeting severe malnutrition criteria (14% weight loss in 4 months). Pt reported unintentional 50 lb weight loss in last year with work-up to date negative  -Continue dronabinol 2.5 mg BID 4-week trial  -Nutrition supplements  Insomnia - Pt reports difficulty falling and staying asleep  -Ambien 5 mg PRN trial   Rash - Pt with pruritic right lower abdominal quadrant and  thoracic area for 2 months of unknown etiology. -Apply hydrocortisone 0.5% cream BID    Diet: Regular DVT Ppx: Coumadin Code: Full   Dispo: Disposition is deferred at this time, awaiting improvement of current medical problems.  Anticipated discharge in approximately 2-3 day(s).   The patient does have a current PCP Axel Filler, MD) and does need an [facility] hospital follow-up appointment after discharge.  The patient does not have transportation limitations that hinder transportation to clinic appointments.  Services Needed at time of discharge: Y = Yes, Blank = No PT:   OT:   RN:   Equipment:   Other:     LOS: 2 days   Juluis Mire, MD 05/24/2013, 2:30 PM

## 2013-05-24 NOTE — Progress Notes (Signed)
Anticoagulation Consult Note  Pharmacy Consult for warfarin  Indication: Afib   Allergies  Allergen Reactions  . Digoxin And Related     Dig toxicity 2009  . Penicillins Rash    Patient Measurements: Height: 6\' 3"  (190.5 cm) Weight: 156 lb 4.9 oz (70.9 kg) IBW/kg (Calculated) : 84.5   Vital Signs: Temp: 98 F (36.7 C) (01/05 0545) Temp src: Oral (01/05 0545) BP: 127/75 mmHg (01/05 0545) Pulse Rate: 80 (01/05 0545)  Labs:  Recent Labs  05/22/13 1920 05/23/13 0800 05/24/13 0555  HGB 14.2  --  12.8*  HCT 42.9  --  38.3*  PLT 175  --  175  LABPROT 16.2* 23.5* 27.1*  INR 1.33 2.17* 2.62*  CREATININE 1.55*  --  1.48*  TROPONINI  --  <0.30  --     Estimated Creatinine Clearance: 47.2 ml/min (by C-G formula based on Cr of 1.48).    Medications:  Warfarin 12.5mg  daily pta  Assessment: 68 year old male on coumadin for Afib. INR today has trended up significantly to 2.62. It seems coumadin was supposed to have been held yesterday but was still ordered. Antibiotic therapy was narrowed down from zosyn and vancomycin to po doxycycline. H/H 12.8/38.3. PLt wnl.   Goal of Therapy:  INR 2-3 Monitor platelets by anticoagulation protocol: Yes    Plan:  Hold Warfarin tonight F/u daily INR Monitor for s/s of bleeding   Albertina Parr, PharmD.  Clinical Pharmacist Pager [phone]

## 2013-05-25 DIAGNOSIS — I4891 Unspecified atrial fibrillation: Secondary | ICD-10-CM

## 2013-05-25 DIAGNOSIS — Z7901 Long term (current) use of anticoagulants: Secondary | ICD-10-CM

## 2013-05-25 DIAGNOSIS — I428 Other cardiomyopathies: Secondary | ICD-10-CM

## 2013-05-25 LAB — BASIC METABOLIC PANEL
BUN: 23 mg/dL (ref 6–23)
CHLORIDE: 96 meq/L (ref 96–112)
CO2: 23 mEq/L (ref 19–32)
Calcium: 8.5 mg/dL (ref 8.4–10.5)
Creatinine, Ser: 1.48 mg/dL — ABNORMAL HIGH (ref 0.50–1.35)
GFR calc Af Amer: 54 mL/min — ABNORMAL LOW (ref >90)
GFR calc non Af Amer: 47 mL/min — ABNORMAL LOW (ref >90)
GLUCOSE: 166 mg/dL — AB (ref 70–99)
Potassium: 4.2 mEq/L (ref 3.7–5.3)
Sodium: 135 mEq/L — ABNORMAL LOW (ref 137–147)

## 2013-05-25 LAB — PROTIME-INR
INR: 1.72 — ABNORMAL HIGH (ref 0.00–1.49)
Prothrombin Time: 19.7 seconds — ABNORMAL HIGH (ref 11.6–15.2)

## 2013-05-25 LAB — TROPONIN I: Troponin I: <0.3 ng/mL (ref <0.30)

## 2013-05-25 LAB — GLUCOSE, CAPILLARY: Glucose-Capillary: 96 mg/dL (ref 70–99)

## 2013-05-25 LAB — MAGNESIUM: Magnesium: 1.6 mg/dL (ref 1.5–2.5)

## 2013-05-25 MED ORDER — DILTIAZEM HCL ER COATED BEADS 360 MG PO CP24
360.0000 mg | ORAL_CAPSULE | Freq: Every day | ORAL | Status: DC
  Administered 2013-05-25 – 2013-05-30 (×6): 360 mg via ORAL
  Filled 2013-05-25 (×7): qty 1

## 2013-05-25 MED ORDER — ENSURE COMPLETE PO LIQD
237.0000 mL | Freq: Two times a day (BID) | ORAL | Status: DC
  Administered 2013-05-25 – 2013-05-29 (×5): 237 mL via ORAL

## 2013-05-25 MED ORDER — DILTIAZEM HCL 100 MG IV SOLR
5.0000 mg/h | INTRAVENOUS | Status: DC
  Administered 2013-05-25: 20 mg/h via INTRAVENOUS
  Administered 2013-05-25: 10 mg/h via INTRAVENOUS
  Administered 2013-05-25: 20 mg/h via INTRAVENOUS
  Administered 2013-05-25: 5 mg/h via INTRAVENOUS
  Filled 2013-05-25 (×2): qty 100

## 2013-05-25 MED ORDER — WARFARIN SODIUM 7.5 MG PO TABS
15.0000 mg | ORAL_TABLET | Freq: Once | ORAL | Status: AC
  Administered 2013-05-25: 15 mg via ORAL
  Filled 2013-05-25: qty 2

## 2013-05-25 MED ORDER — TIOTROPIUM BROMIDE MONOHYDRATE 18 MCG IN CAPS
18.0000 ug | ORAL_CAPSULE | Freq: Every day | RESPIRATORY_TRACT | Status: DC
  Administered 2013-05-26 – 2013-05-30 (×4): 18 ug via RESPIRATORY_TRACT
  Filled 2013-05-25: qty 5

## 2013-05-25 MED ORDER — DILTIAZEM LOAD VIA INFUSION
10.0000 mg | Freq: Once | INTRAVENOUS | Status: AC
  Administered 2013-05-25: 10 mg via INTRAVENOUS
  Filled 2013-05-25: qty 10

## 2013-05-25 NOTE — Progress Notes (Signed)
INITIAL NUTRITION ASSESSMENT  DOCUMENTATION CODES Per approved criteria  -Severe malnutrition in the context of chronic illness   INTERVENTION: Ensure Complete po BID, each supplement provides 350 kcal and 13 grams of protein HS snack daily RD to follow for nutrition care plan  NUTRITION DIAGNOSIS: Increased nutrient needs related to malnutrition, repletion as evidenced by estimated nutrition needs  Goal: Pt to meet >/= 90% of their estimated nutrition needs   Monitor:  PO & supplemental intake, weight, labs, I/O's  Reason for Assessment: Consult  68 y.o. male  Admitting Dx: COPD exacerbation  ASSESSMENT: Patient with PMH of atrial fibrillation, COPD, HTN, and stage III chronic kidney disease who was recently discharged from the hospital after treatment for a COPD exacerbation; visiting RN found patient's heart rate in 130s and 140s per RN recommendation -- admitted for further evaluation.  Patient know to Clinical Nutrition with previous admission; patient with hx of poor PO intake at home; has been on Megace in the past, however, this was discontinued due to blood clots; patient has drank Ensure supplements but they are expensive for him; + Marinol; RD to order HS snack & Ensure Complete BID.  Patient meets criteria for severe malnutrition in the context of chronic illness as evidenced by < 75% intake of estimated energy requirement for > 1 month and severe muscle loss (clavicles, deltoids, pectoralis).  Height: Ht Readings from Last 1 Encounters:  05/22/13 $RemoveB'6\' 3"'agiyfUBM$  (1.905 m)    Weight: Wt Readings from Last 1 Encounters:  05/25/13 155 lb 11.2 oz (70.625 kg)    Ideal Body Weight: 196 lb  % Ideal Body Weight: 79%  Wt Readings from Last 10 Encounters:  05/25/13 155 lb 11.2 oz (70.625 kg)  05/18/13 138 lb 14.4 oz (63.005 kg)  05/17/13 144 lb 4.8 oz (65.454 kg)  02/05/13 153 lb 11.2 oz (69.718 kg)  01/05/13 161 lb 14.4 oz (73.437 kg)  12/28/12 163 lb (73.936 kg)   11/30/12 158 lb 12.8 oz (72.031 kg)  11/25/12 165 lb 6.4 oz (75.025 kg)  11/02/12 157 lb 4 oz (71.328 kg)  10/30/12 160 lb 12.8 oz (72.938 kg)    Usual Body Weight: 160 lb  % Usual Body Weight: 96%  BMI:  Body mass index is 19.46 kg/(m^2).  Estimated Nutritional Needs: Kcal: 1800-2000 Protein: 95-105 gm Fluid: 1.8-2.0 L  Skin: Intact  Diet Order: General  EDUCATION NEEDS: -No education needs identified at this time   Intake/Output Summary (Last 24 hours) at 05/25/13 1218 Last data filed at 05/25/13 1204  Gross per 24 hour  Intake    700 ml  Output   1700 ml  Net  -1000 ml    Labs:   Recent Labs Lab 05/22/13 1920 05/24/13 0555 05/25/13 0825  NA 144 140 135*  K 4.1 5.5* 4.2  CL 110 104 96  CO2 $Re'21 23 23  'rpx$ BUN 25* 25* 23  CREATININE 1.55* 1.48* 1.48*  CALCIUM 8.1* 8.7 8.5  MG  --   --  1.6  GLUCOSE 107* 84 166*    Scheduled Meds: . buPROPion  75 mg Oral BID  . diltiazem  360 mg Oral Daily  . doxycycline  100 mg Oral Q12H  . dronabinol  2.5 mg Oral BID AC  . dronedarone  400 mg Oral BID WC  . hydrocortisone cream   Topical BID  . lisinopril  5 mg Oral Daily  . metoprolol succinate  50 mg Oral Daily  . nicotine  7 mg Transdermal Daily  .  pantoprazole  40 mg Oral Daily  . sodium chloride  3 mL Intravenous Q12H  . warfarin  15 mg Oral ONCE-1800  . Warfarin - Pharmacist Dosing Inpatient   Does not apply q1800    Continuous Infusions: . diltiazem (CARDIZEM) infusion 20 mg/hr (05/25/13 0741)    Past Medical History  Diagnosis Date  . Hypertension   . CKD (chronic kidney disease)     Renal U/S 12/04/2009 showed no pathological findings. Labs 12/04/2009 include normal ESR, C3, C4; neg ANA; SPEP showed nonspecific increase in the alpha-2 region with no M-spike; UPEP showed no monoclonal free light chains; urine IFE showed polyclonal increase in feree Kappa and/or free Lambda light chains. Baseline Cr reported 1.7-2.5.  [name] Chronic systolic heart failure      a. NICM - EF 35% with normal cors 2003. b. Last echo 11/2012 - EF 25-30%.  . Depression   . GERD (gastroesophageal reflux disease)   . Portal vein thrombosis   . Avascular necrosis     right hip s/p replacement  . Gastritis   . Alcohol abuse   . Erectile dysfunction   . Bipolar disorder   . Hydrocele, unspecified   . Intermittent claudication   . Lung nodule     Chest CT scan on 12/14/2008 showed a nodular opacity at the left lung base felt to most likely represent scarring.  Follow-up chest CT scan on 06/02/2010 showed parenchymal scarring in the left apex, left  lower lobe, and lingula; some of this scarring at the left base had a nodular appearance, unchanged. Chest 09/2012 - stable.  . 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  Thyroid scan 08/28/2009 was normal with no focal areas of abnormal increased or decreased activity seen; the uptake of I 131 sodium iodide at 24 hours was 5.7%.  TSH and free T4 normalized by 08/16/2009.  [name] Pneumonia   . Intestinal obstruction   . COPD (chronic obstructive pulmonary disease)   . Clostridium difficile colitis   . E coli bacteremia   . Cluster headaches   . DVT (deep venous thrombosis)     He has hypercoagulability with multiple prior DVTs  . Pleural plaque     H/o asbestos exposure. Chest CT on 06/02/2010 showed stable extensive calcified pleural plaques involving the left hemithorax, consistent with asbestos related pleural disease.  . Weight loss     Normal colonoscopy by Dr. Olevia Perches on 11/06/2010.  . Tachycardia-bradycardia syndrome     a. 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 b. [employer] gen change 2010.  [name] PAF (paroxysmal atrial fibrillation)     Paroxysmal, failed medical therapy with amiodarone but has done very well with multaq  . Osteoarthritis   . Spondylosis   . Chronic low back pain     "cause of my hip" (03/17/2012)  . 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  . Noncompliance   . Atrial flutter   . Thrombocytopenia     Past Surgical History  Procedure Laterality Date  . Left hip bipolar hemiarthroplasty  09/09/2002    By Dr. Lafayette Dragon  . Insert / replace / remove pacemaker  02/2003    By Dr. Roe Rutherford, gen change by Greggory Brandy  . Total hip arthroplasty  03/08/2008    right; By Dr. Hiram Comber    Arthur Holms, RD, LDN Pager #: [phone] After-Hours Pager #:  [phone]

## 2013-05-25 NOTE — Progress Notes (Signed)
Anticoagulation Consult Note  Pharmacy Consult for warfarin  Indication: Afib   Allergies  Allergen Reactions  . Digoxin And Related     Dig toxicity 2009  . Penicillins Rash    Patient Measurements: Height: 6\' 3"  (190.5 cm) Weight: 155 lb 11.2 oz (70.625 kg) IBW/kg (Calculated) : 84.5   Vital Signs: Temp: 98.7 F (37.1 C) (01/06 0300) Temp src: Oral (01/06 0300) BP: 135/82 mmHg (01/06 0300) Pulse Rate: 127 (01/06 0300)  Labs:  Recent Labs  05/22/13 1920 05/23/13 0800 05/24/13 0555 05/25/13 0140 05/25/13 0322 05/25/13 0825  HGB 14.2  --  12.8*  --   --   --   HCT 42.9  --  38.3*  --   --   --   PLT 175  --  175  --   --   --   LABPROT 16.2* 23.5* 27.1*  --  19.7*  --   INR 1.33 2.17* 2.62*  --  1.72*  --   CREATININE 1.55*  --  1.48*  --   --  1.48*  TROPONINI  --  <0.30  --  <0.30  --   --     Estimated Creatinine Clearance: 47 ml/min (by C-G formula based on Cr of 1.48).    Medications:  Warfarin 12.5mg  daily pta  Assessment: 68 year old male on coumadin for Afib. INR today has trended down to 1.72 (Would avoid holding coumadin in the future unless INR trends up significantly). All other labs above. No bleeding noted.     Goal of Therapy:  INR 2-3 Monitor platelets by anticoagulation protocol: Yes    Plan:  Warfarin 15 mg x 1 dose tonight  F/u daily INR Monitor for s/s of bleeding   Albertina Parr, PharmD.  Clinical Pharmacist Pager [phone]

## 2013-05-25 NOTE — Progress Notes (Signed)
  Date: 05/25/2013  Patient name: Travis MUZQUIZ Sr.  Medical record number: [id number]  Date of birth: [birth year]   This patient has been seen and the plan of care was discussed with the house staff. Dr. Harley Hallmark note is pending at this time, please see once complete for details of patient progress.    Briefly, [name] presented for Afib/flutter with RVR thought to be due to a decrease in his medications (metoprolol) after last hospital stay and change in pacemaker settings.  This medication was increased back to 50mg  BID, however, he converted back into RVR overnight and required placement back on a cardizem drip.  Cardiology was consulted, recommended starting oral diltiazem along with multaq and metoprolol chronic medications, which has been done.  Cardizem drip should be weaned to off as tolerated.  He is on coumadin for his Afib/flutter, however, has been subtherapeutic, pharmacy following.  He has also been noted by nutrition to have severe malnutrition and appropriate supplementation will be started.  Other issues included mild hyperkalemia (5.5) improved to 4.2 today after kayexalate (could have also been a spurious result), COPD with mild exacerbation which does continue to improve.  Other issues per Dr. Harley Hallmark upcoming note.   Sid Falcon, MD 05/25/2013, 4:05 PM

## 2013-05-25 NOTE — Progress Notes (Signed)
MD on call made aware of pt's BP and HR. MD is coming on the floor to see pt before giving any orders.   [name] M

## 2013-05-25 NOTE — Progress Notes (Addendum)
Subjective:  Pt seen and examined in AM. Last night he had sustained tachycardia (HR 105-130's) for 2 hours and cardizem drip was restarted. Stat troponin was negative and 12-lead EKG did not reveal ischemic changes. Pt reports shortness of breath and fatigue, worse with activity. He denies chest pain and fluttering. He reports improving cough. He is able to ambulate without lightheadedness. Pt denies fever, chills, headache, nausea, vomiting, change in BM or urination. He reports ambien did help somewhat for sleep.      Objective: Vital signs in last 24 hours: Filed Vitals:   05/24/13 2306 05/25/13 0300 05/25/13 1332 05/25/13 1638  BP: 154/115 135/82 112/78 113/71  Pulse: 134 127 67 60  Temp:  98.7 F (37.1 C) 98 F (36.7 C)   TempSrc:  Oral Oral   Resp:  19 19   Height:      Weight:  70.625 kg (155 lb 11.2 oz)    SpO2:  97% 97%    Weight change:   Intake/Output Summary (Last 24 hours) at 05/25/13 1659 Last data filed at 05/25/13 1300  Gross per 24 hour  Intake    880 ml  Output   1900 ml  Net  -1020 ml   Vitals reviewed. General: resting in bed, NAD, on [place] 2L O2 HEENT: PERRL, EOMI Cardiac: normal rate, irregularly irregular Pulm: b/l rhonchi and mild expiratory wheezing Abd: soft, nontender, nondistended, BS present Ext: warm and well perfused, no pedal edema Neuro: alert and oriented X3  Lab Results: Basic Metabolic Panel:  Recent Labs Lab 05/24/13 0555 05/25/13 0825  NA 140 135*  K 5.5* 4.2  CL 104 96  CO2 23 23  GLUCOSE 84 166*  BUN 25* 23  CREATININE 1.48* 1.48*  CALCIUM 8.7 8.5  MG  --  1.6   CBC:  Recent Labs Lab 05/22/13 1920 05/24/13 0555  WBC 11.7* 11.6*  HGB 14.2 12.8*  HCT 42.9 38.3*  MCV 94.1 91.6  PLT 175 175   Cardiac Enzymes:  Recent Labs Lab 05/23/13 0800 05/25/13 0140  TROPONINI <0.30 <0.30   BNP:  Recent Labs Lab 05/22/13 1920  PROBNP 9662.0*   CBG:  Recent Labs Lab 05/19/13 0636 05/25/13 1624  GLUCAP 97  96   Coagulation:  Recent Labs Lab 05/22/13 1920 05/23/13 0800 05/24/13 0555 05/25/13 0322  LABPROT 16.2* 23.5* 27.1* 19.7*  INR 1.33 2.17* 2.62* 1.72*   Urine Drug Screen: Drugs of Abuse     Component Value Date/Time   LABOPIA POSITIVE* 10/12/2012 1724   LABOPIA NEGATIVE 12/27/2008 1943   COCAINSCRNUR NONE DETECTED 10/12/2012 1724   COCAINSCRNUR NEG 07/06/2009 2225   LABBENZ NONE DETECTED 10/12/2012 1724   LABBENZ NEG 07/06/2009 2225   AMPHETMU NONE DETECTED 10/12/2012 1724   AMPHETMU NEG 07/06/2009 2225   THCU POSITIVE* 10/12/2012 1724   LABBARB NONE DETECTED 10/12/2012 1724    Studies/Results: No results found. Medications: I have reviewed the patient's current medications. Scheduled Meds: . buPROPion  75 mg Oral BID  . diltiazem  360 mg Oral Daily  . doxycycline  100 mg Oral Q12H  . dronabinol  2.5 mg Oral BID AC  . dronedarone  400 mg Oral BID WC  . feeding supplement (ENSURE COMPLETE)  237 mL Oral BID BM  . hydrocortisone cream   Topical BID  . lisinopril  5 mg Oral Daily  . metoprolol succinate  50 mg Oral Daily  . nicotine  7 mg Transdermal Daily  . pantoprazole  40 mg  Oral Daily  . sodium chloride  3 mL Intravenous Q12H  . warfarin  15 mg Oral ONCE-1800  . Warfarin - Pharmacist Dosing Inpatient   Does not apply q1800   Continuous Infusions: . diltiazem (CARDIZEM) infusion 10 mg/hr (05/25/13 1320)   PRN Meds:.ipratropium, levalbuterol, oxyCODONE, oxyCODONE-acetaminophen, zolpidem Assessment/Plan: Principal Problem:   COPD exacerbation Active Problems:   TOBACCO ABUSE   HYPERTENSION   PAF (paroxysmal atrial fibrillation) on chronic coumadin   BRADYCARDIA-TACHYCARDIA SYNDROME   COPD (chronic obstructive pulmonary disease)   G E R D   OSTEOARTHRITIS   HYPERTHYROIDISM   Chronic kidney disease   Chronic combined systolic and diastolic congestive heart failure   Non-ischemic cardiomyopathy   Atrial fibrillation  Assessment: [name] is a 68 year old male  with PMH of Atrial fibrillation (on coumadin with sub-therapeutic INR), HTN, CKD 3, COPD, tachy-brady syndrome (with pacemaker), non-ischemic cardiomyopathy, and diastolic HF (EF 52-84% Dec 2014) admitted for atrial flutter/fib RVR.   Plan:   Atrial flutter in setting of tachy-brady syndrome--currently in atrial flutter, HR 127. Most likely due to decrease in metoprolol from 50 to 25 mg daily and re-setting of pacemaker on last admission. TSH wnl. -Continue metoprolol succinate 50 mg daily  -Continue dronedarone 400 mg BID  -Appreciate cardiology recommendations --> wean drip, start PO diltiazem 360 mg daily  -Continue coumadin per pharmacy and monitoring INR, subtherapeutic today 1.71 --> cannot cardiovert per cardiology  -Continue to monitor on telemetry  COPD Mild Exacerbation - improving. Pt with chronic cough and dyspnea with exertion s/p 5 day azithromycin and prednisone course.  HCAP unlikely giving chronicity of CXR and relatively unchanged respiratory symptoms from previous admission. Pt with +urine strep pneumoniae and -urine legionella Ag. - Awaiting sputum culture  - Continue doxycycline 100 mg po bid Day 3/5 - Oxygen therapy to keep SpO2 >92% - Breathing treatment Q6 hr - Continue home Spiriva daily    Combined Systolic and Diastolic CHF - Pt with 2D-echo on 12/30 with improved systolic function EF 13-24%, no wall motion, and inability to assess diastolic function. Pt without signs of volume overload on exam or CXR.  -Continue metoprolol succinate 50 mg daily   -Monitor daily weight (156 to 155) and I & O's 1.3L out yesterday)  Chronic Kidney Disease Stage 3 - Pt with with Cr 1.48 below baseline Cr 2.0 -Avoid nephrotoxins -Continue to monitor renal function  Hypertension - currently normotensive  -Continue home lisinopril 5 mg daily  -Continue 50 mg metoprolol succinate   -Start PO diltiazem 360 mg daily   Severe Malnutrition - Pt with BMI of 17.36 indicating underweight  and meeting severe malnutrition criteria (14% weight loss in 4 months). Pt reported unintentional 50 lb weight loss in last year with work-up to date negative  -Continue dronabinol 2.5 mg BID 4-week trial  -Nutrition supplements  Leukocytosis - Most likely due to recent corticosteroid course. Pt also with +Strep Pneum urine antigen -Continue to monitor CBC  Insomnia - Pt reports difficulty falling and staying asleep  -Ambien 5 mg PRN trial   Rash - Pt with pruritic right lower abdominal quadrant and thoracic area for 2 months of unknown etiology. -Apply hydrocortisone 0.5% cream BID    Diet: Regular DVT Ppx: Coumadin Code: Full   Dispo: Disposition is deferred at this time, awaiting improvement of current medical problems.  Anticipated discharge in approximately 2-3 day(s).   The patient does have a current PCP Axel Filler, MD) and does need an [facility]  hospital follow-up appointment after discharge.  The patient does not have transportation limitations that hinder transportation to clinic appointments.  Services Needed at time of discharge: Y = Yes, Blank = No PT:   OT:   RN:   Equipment:   Other:     LOS: 3 days   Juluis Mire, MD 05/25/2013, 4:59 PM

## 2013-05-25 NOTE — Progress Notes (Signed)
S: Paged by RN, pt in sustained tachycardia.  Upon entering the room, patient was sitting by the bed using urinal. Denied Chest pain, shortness of breath, abdominal pain, or diaphoresis.  Per nursing patient has had sustained tachycardia for almost 2 hours. Telemetry central was called and confirmed that the patient has had sustained tachycardia for over 2 hours with HR >105, to 130s.   O: BP 158/116, HR 130, RR 18, Temp 98.25F, O2 Sat: 96% on RA.  Gen: Pt sitting in bed, in NAD, using urinal Heart: tachycardia Lung: Normal work of breathing, normal air movement Neuro: Alert and oriented x 3  A&P:  -Ordered 12-lead EKG STAT -Restart Cardizem drip with loading infusion followed by continuous infusion with titration and holding parameters.  -Troponin I

## 2013-05-25 NOTE — Progress Notes (Signed)
Gave the cardizem PO to patient and titrated the cardizem to 10 ml/hr per Dr. Rayann Heman. Also instructed to turn off cardizem in 1 hour. Wil continue to monitor closely. Glade Nurse, RN

## 2013-05-25 NOTE — Consult Note (Addendum)
ELECTROPHYSIOLOGY CONSULT NOTE    Patient ID: Travis Reason Sr. MRN: [id number], DOB/AGE: July 30, 1945 68 y.o.  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  He was admitted late December 2014 for COPD exacerbation.  He was also noted to be bradycardic and his Metoprolol dose was decreased at that time.   He was discharged to home on 12-31.  He did well for a couple of days and then had worsening shortness of breath and tachycardia and presented back to the hospital 05-22-2013. He was found to be in atrial fibrillation with RVR and was placed on a Diltiazem drip.  His Metoprolol dose was increased back to 66m twice daily. Despite increased dose of Metoprolol and Diltiazem drip, he has remained tachycardic.  He denies chest pain or shortness of breath this morning and states that he feels fatigued.  He has been ambulating without difficulty.    Echo 05-18-13 demonstrated EF 55-60%, no RWMA, LA 49.   EP has been asked to evaluate for treatment options.  ROS is negative except as outlined above.   Past Medical History  Diagnosis Date  . Hypertension   . CKD (chronic kidney disease)     Renal U/S 12/04/2009 showed no pathological findings. Labs 12/04/2009 include normal ESR, C3, C4; neg ANA; SPEP showed nonspecific increase in the alpha-2 region with no M-spike; UPEP showed no monoclonal free light chains; urine IFE showed polyclonal increase in feree Kappa and/or free Lambda light chains. Baseline Cr reported 1.7-2.5.  .Marland KitchenChronic systolic heart failure     a. NICM - EF 35% with normal cors 2003. b. Last echo 11/2012 - EF 25-30%.  . Depression   . GERD (gastroesophageal  reflux disease)   . Portal vein thrombosis   . Avascular necrosis     right hip s/p replacement  . Gastritis   . Alcohol abuse   . Erectile dysfunction   . Bipolar disorder   . Hydrocele, unspecified   . Intermittent claudication   . Lung nodule     Chest CT scan on 12/14/2008 showed a nodular opacity at the left lung base felt to most likely represent scarring.  Follow-up chest CT scan on 06/02/2010 showed parenchymal scarring in the left apex, left  lower lobe, and lingula; some of this scarring at the left base had a nodular appearance, unchanged. Chest 09/2012 - stable.  . 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  Thyroid scan 08/28/2009 was normal with no focal areas of abnormal increased or decreased activity seen; the uptake of I 131 sodium iodide at 24 hours was 5.7%.  TSH and free T4 normalized by 08/16/2009.  .Marland KitchenPneumonia   . Intestinal obstruction   . COPD (chronic obstructive pulmonary disease)   . Clostridium difficile colitis   . E coli bacteremia   . Cluster headaches   . DVT (deep venous thrombosis)     He has hypercoagulability with multiple prior DVTs  . Pleural plaque     H/o asbestos exposure. Chest CT on 06/02/2010 showed stable extensive calcified pleural plaques involving the left hemithorax, consistent with asbestos related pleural disease.  . Weight loss  Normal colonoscopy by Dr. Olevia Perches on 11/06/2010.  . Tachycardia-bradycardia syndrome     a. 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 b. [employer] gen change 2010.  [name] PAF (paroxysmal atrial fibrillation)     Paroxysmal, failed medical therapy with amiodarone but has done very well with multaq  . Osteoarthritis   . Spondylosis   . Chronic low back pain     "cause of my hip" (03/17/2012)  . 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  . Noncompliance   . Atrial flutter   .  Thrombocytopenia      Surgical History:  Past Surgical History  Procedure Laterality Date  . Left hip bipolar hemiarthroplasty  09/09/2002    By Dr. Lafayette Dragon  . Insert / replace / remove pacemaker  02/2003    By Dr. Roe Rutherford, gen change by Greggory Brandy  . Total hip arthroplasty  03/08/2008    right; By Dr. Hiram Comber     Prescriptions prior to admission  Medication Sig Dispense Refill  . albuterol (PROVENTIL HFA;VENTOLIN HFA) 108 (90 BASE) MCG/ACT inhaler Inhale 2 puffs into the lungs every 6 (six) hours as needed for wheezing or shortness of breath.      [name] buPROPion (WELLBUTRIN) 75 MG tablet Take 1 tablet (75 mg total) by mouth 2 (two) times daily.  60 tablet  5  . dronabinol (MARINOL) 2.5 MG capsule Take 1 capsule (2.5 mg total) by mouth 2 (two) times daily before lunch and supper.  60 capsule  0  . dronedarone (MULTAQ) 400 MG tablet Take 400 mg by mouth 2 (two) times daily with a meal.      . lisinopril (PRINIVIL,ZESTRIL) 5 MG tablet Take 1 tablet (5 mg total) by mouth daily.  90 tablet  3  . metoprolol succinate (TOPROL XL) 25 MG 24 hr tablet Take 1 tablet (25 mg total) by mouth daily.  90 tablet  3  . omeprazole (PRILOSEC) 20 MG capsule Take 40 mg by mouth daily.      [name] oxyCODONE-acetaminophen (PERCOCET) 10-325 MG per tablet Take 1 tablet by mouth every 6 (six) hours as needed for pain.  120 tablet  0  . tiotropium (SPIRIVA) 18 MCG inhalation capsule Place 1 capsule (18 mcg total) into inhaler and inhale daily.  30 capsule  11  . warfarin (COUMADIN) 5 MG tablet Take 2.5 tablets (12.5 mg total) by mouth daily.  30 tablet  1    Inpatient Medications:  . buPROPion  75 mg Oral BID  . doxycycline  100 mg Oral Q12H  . dronabinol  2.5 mg Oral BID AC  . dronedarone  400 mg Oral BID WC  . hydrocortisone cream   Topical BID  . lisinopril  5 mg Oral Daily  . metoprolol succinate  50 mg Oral Daily  . nicotine  7 mg Transdermal Daily  . pantoprazole  40 mg Oral Daily  . sodium chloride   3 mL Intravenous Q12H  . Warfarin - Pharmacist Dosing Inpatient   Does not apply q1800   . diltiazem (CARDIZEM) infusion 20 mg/hr (05/25/13 0741)     Allergies:  Allergies  Allergen Reactions  . Digoxin And Related     Dig toxicity 2009  . Penicillins Rash   BP 135/82  Pulse 127  Temp(Src) 98.7 F (37.1 C) (Oral)  Resp 19  Ht _0  (1.905 m)  Wt 155 lb 11.2 oz (70.625  kg)  BMI 19.46 kg/m2  SpO2 97%   History   Social History  . Marital Status: Widowed    Spouse Name: N/A    Number of Children: 10  . Years of Education: N/A   Occupational History  . RETIRED    Social History Main Topics  . Smoking status: Current Every Day Smoker -- 0.50 packs/day for 50 years    Types: Cigarettes  . Smokeless tobacco: Never Used  . Alcohol Use: No     Comment: prior etoh abuse  . Drug Use: 1.00 per week    Special: Marijuana     Comment: prior marijuana  . Sexual Activity: Yes   Other Topics Concern  . Not on file   Social History Narrative   Works as a Architect part time   Smoker 1 pack last 1.5 days tobacco, smokes marijuana    Denies EtOH x 4 years (on 10/12/12)   12 kids    From [place]   [place] Veteran    12 grade education               Family History  Problem Relation Age of Onset  . Hypertension Mother   . Cancer Brother     Unsure of type.  . Asthma Mother   . Coronary artery disease Mother   . Colon cancer Neg Hx   . Lung cancer Neg Hx   . Prostate cancer Neg Hx     Labs:   Lab Results  Component Value Date   WBC 11.6* 05/24/2013   HGB 12.8* 05/24/2013   HCT 38.3* 05/24/2013   MCV 91.6 05/24/2013   PLT 175 05/24/2013    Recent Labs Lab 05/24/13 0555  NA 140  K 5.5*  CL 104  CO2 23  BUN 25*  CREATININE 1.48*  CALCIUM 8.7  PROT 6.3  BILITOT 0.2*  ALKPHOS 58  ALT 22  AST 18  GLUCOSE 84   INR 1.72 today   Radiology/Studies: Dg Chest 2 View 05/22/2013   CLINICAL DATA:  Chest pain with shortness of breath. History of pacemaker and  congestive heart failure.  EXAM: CHEST  2 VIEW  COMPARISON:  05/17/2013 radiographs.  CT 10/12/2012.  FINDINGS: The right subclavian pacemaker leads appear unchanged within the right ventricle. The heart size and mediastinal contours are stable. There is chronic volume loss in the left hemithorax with parenchymal scarring, pleural thickening and pleural calcification. Compared with the prior studies, there is increased density inferiorly suspicious for superimposed airspace disease. The right lung is clear.  IMPRESSION: Increased density in the lower left hemithorax compared with prior studies, suspicious for pneumonia superimposed on chronic pleural parenchymal scarring. Radiographic follow up recommended.   Electronically Signed   By: Camie Patience M.D.   On: 05/22/2013 20:46   Dg Chest 2 View 05/17/2013   CLINICAL DATA:  Dizziness and shortness of breath.  EXAM: CHEST  2 VIEW  COMPARISON:  03/17/2012  FINDINGS: Dual lead pacemaker remains in place. The right lung again shows chronic hyperinflation. Nipple shadow present on the right. The left lung shows chronic pleural and parenchymal scarring, not significantly changed. No evidence of active pneumonia, heart failure, effusion or collapse.  IMPRESSION: Chronic lung disease with hyperinflation on the right and extensive pleural parenchymal scarring on the left. No change compared previous studies.   Electronically Signed   By: Nelson Chimes M.D.   On: 05/17/2013 13:03   Physical Exam: Filed Vitals:   05/24/13 1300  05/24/13 2208 05/24/13 2306 05/25/13 0300  BP: 136/88 158/116 154/115 135/82  Pulse: 75 130 134 127  Temp: 98.7 F (37.1 C) 98.9 F (37.2 C)  98.7 F (37.1 C)  TempSrc: Oral Oral  Oral  Resp: _0 Height:      Weight:    155 lb 11.2 oz (70.625 kg)  SpO2: 99% 96%  97%    GEN- The patient is well appearing, alert and oriented x 3 today.   Head- normocephalic, atraumatic Eyes-  Sclera clear, conjunctiva pink Ears- hearing  intact Oropharynx- clear Neck- supple  Lungs- Clear to ausculation bilaterally, normal work of breathing Heart- irregular rate and rhythm  GI- soft, NT, ND, + BS Extremities- no clubbing, cyanosis, or edema   EKG:2:1 atrial flutter, ventricular rate 137, QRS 84  TELEMETRY: atrial fib with ventricular rates up to 140's  DEVICE HISTORY: single chamber STJ pacemaker  A/P 1. Afib/ atrial flutter He reports symptoms of fatigue but is mostly tolerant of his atrial arrhythmias. Will try to convert back to oral cardizem.  Unfortunately given subtherapeutic INR we cannot cardiovert presently. I will keep him on his home regimen of multaq and metoprolol.  2. Chronic systolic dysfunction EF is improved Continue rate control  3. Prior DVT/PE Continue long term anticoagulation

## 2013-05-26 ENCOUNTER — Ambulatory Visit: Payer: Medicare Other | Admitting: Internal Medicine

## 2013-05-26 DIAGNOSIS — I5032 Chronic diastolic (congestive) heart failure: Secondary | ICD-10-CM

## 2013-05-26 LAB — BASIC METABOLIC PANEL
BUN: 25 mg/dL — ABNORMAL HIGH (ref 6–23)
CO2: 27 mEq/L (ref 19–32)
Calcium: 8.6 mg/dL (ref 8.4–10.5)
Chloride: 107 mEq/L (ref 96–112)
Creatinine, Ser: 1.71 mg/dL — ABNORMAL HIGH (ref 0.50–1.35)
GFR, EST AFRICAN AMERICAN: 45 mL/min — AB (ref >90)
GFR, EST NON AFRICAN AMERICAN: 39 mL/min — AB (ref >90)
Glucose, Bld: 114 mg/dL — ABNORMAL HIGH (ref 70–99)
POTASSIUM: 4.6 meq/L (ref 3.7–5.3)
SODIUM: 145 meq/L (ref 137–147)

## 2013-05-26 LAB — MAGNESIUM: MAGNESIUM: 1.8 mg/dL (ref 1.5–2.5)

## 2013-05-26 LAB — PROTIME-INR
INR: 1.59 — AB (ref 0.00–1.49)
PROTHROMBIN TIME: 18.5 s — AB (ref 11.6–15.2)

## 2013-05-26 LAB — HEPARIN LEVEL (UNFRACTIONATED): Heparin Unfractionated: 0.18 IU/mL — ABNORMAL LOW (ref 0.30–0.70)

## 2013-05-26 MED ORDER — SODIUM CHLORIDE 0.9 % IV SOLN
INTRAVENOUS | Status: DC
  Administered 2013-05-27: 20 mL/h via INTRAVENOUS

## 2013-05-26 MED ORDER — DIPHENHYDRAMINE HCL 25 MG PO CAPS: 25.0000 mg | ORAL_CAPSULE | Freq: Every evening | ORAL | Status: DC | PRN

## 2013-05-26 MED ORDER — DIPHENHYDRAMINE HCL 25 MG PO CAPS
25.0000 mg | ORAL_CAPSULE | Freq: Every evening | ORAL | Status: DC | PRN
  Administered 2013-05-26 – 2013-05-30 (×4): 25 mg via ORAL
  Filled 2013-05-26 (×4): qty 1

## 2013-05-26 MED ORDER — HEPARIN (PORCINE) IN NACL 100-0.45 UNIT/ML-% IJ SOLN
1250.0000 [IU]/h | INTRAMUSCULAR | Status: DC
  Administered 2013-05-26: 1000 [IU]/h via INTRAVENOUS
  Filled 2013-05-26 (×3): qty 250

## 2013-05-26 MED ORDER — IPRATROPIUM BROMIDE HFA 17 MCG/ACT IN AERS: 2.0000 | INHALATION_SPRAY | RESPIRATORY_TRACT | Status: DC | PRN

## 2013-05-26 MED ORDER — IPRATROPIUM BROMIDE 0.02 % IN SOLN
0.5000 mg | RESPIRATORY_TRACT | Status: DC | PRN
  Administered 2013-05-27: 0.5 mg via RESPIRATORY_TRACT

## 2013-05-26 MED ORDER — WARFARIN SODIUM 7.5 MG PO TABS
15.0000 mg | ORAL_TABLET | Freq: Once | ORAL | Status: AC
  Administered 2013-05-26: 15 mg via ORAL
  Filled 2013-05-26: qty 2

## 2013-05-26 NOTE — Progress Notes (Signed)
ANTICOAGULATION CONSULT NOTE - Initial Consult  Pharmacy Consult for heparin Indication: atrial fibrillation  Allergies  Allergen Reactions  . Digoxin And Related     Dig toxicity 2009  . Penicillins Rash    Patient Measurements: Height: '6\' 3"'  (190.5 cm) Weight: 150 lb 8 oz (68.266 kg) IBW/kg (Calculated) : 84.5  Vital Signs: Temp: 98.2 F (36.8 C) (01/07 0440) Temp src: Oral (01/07 0440) BP: 112/90 mmHg (01/07 0440) Pulse Rate: 102 (01/07 0440)  Labs:  Recent Labs  05/23/13 0800 05/24/13 0555 05/25/13 0140 05/25/13 0322 05/25/13 0825 05/26/13 0404  HGB  --  12.8*  --   --   --   --   HCT  --  38.3*  --   --   --   --   PLT  --  175  --   --   --   --   LABPROT 23.5* 27.1*  --  19.7*  --  18.5*  INR 2.17* 2.62*  --  1.72*  --  1.59*  CREATININE  --  1.48*  --   --  1.48* 1.71*  TROPONINI <0.30  --  <0.30  --   --   --     Estimated Creatinine Clearance: 39.4 ml/min (by C-G formula based on Cr of 1.71).   Medical History: Past Medical History  Diagnosis Date  . Hypertension   . CKD (chronic kidney disease)     Renal U/S 12/04/2009 showed no pathological findings. Labs 12/04/2009 include normal ESR, C3, C4; neg ANA; SPEP showed nonspecific increase in the alpha-2 region with no M-spike; UPEP showed no monoclonal free light chains; urine IFE showed polyclonal increase in feree Kappa and/or free Lambda light chains. Baseline Cr reported 1.7-2.5.  [name] Chronic systolic heart failure     a. NICM - EF 35% with normal cors 2003. b. Last echo 11/2012 - EF 25-30%.  . Depression   . GERD (gastroesophageal reflux disease)   . Portal vein thrombosis   . Avascular necrosis     right hip s/p replacement  . Gastritis   . Alcohol abuse   . Erectile dysfunction   . Bipolar disorder   . Hydrocele, unspecified   . Intermittent claudication   . Lung nodule     Chest CT scan on 12/14/2008 showed a nodular opacity at the left lung base felt to most likely represent scarring.   Follow-up chest CT scan on 06/02/2010 showed parenchymal scarring in the left apex, left  lower lobe, and lingula; some of this scarring at the left base had a nodular appearance, unchanged. Chest 09/2012 - stable.  . 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  Thyroid scan 08/28/2009 was normal with no focal areas of abnormal increased or decreased activity seen; the uptake of I 131 sodium iodide at 24 hours was 5.7%.  TSH and free T4 normalized by 08/16/2009.  [name] Pneumonia   . Intestinal obstruction   . COPD (chronic obstructive pulmonary disease)   . Clostridium difficile colitis   . E coli bacteremia   . Cluster headaches   . DVT (deep venous thrombosis)     He has hypercoagulability with multiple prior DVTs  . Pleural plaque     H/o asbestos exposure. Chest CT on 06/02/2010 showed stable extensive calcified pleural plaques involving the left hemithorax, consistent with asbestos related pleural disease.  . Weight loss     Normal colonoscopy by Dr. Olevia Perches on 11/06/2010.  Jackelyn Poling  syndrome     a. 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 b. [employer] gen change 2010.  [name] PAF (paroxysmal atrial fibrillation)     Paroxysmal, failed medical therapy with amiodarone but has done very well with multaq  . Osteoarthritis   . Spondylosis   . Chronic low back pain     "cause of my hip" (03/17/2012)  . 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  . Noncompliance   . Atrial flutter   . Thrombocytopenia     Medications:  Prescriptions prior to admission  Medication Sig Dispense Refill  . albuterol (PROVENTIL HFA;VENTOLIN HFA) 108 (90 BASE) MCG/ACT inhaler Inhale 2 puffs into the lungs every 6 (six) hours as needed for wheezing or shortness of breath.      [name] buPROPion (WELLBUTRIN) 75 MG tablet Take 1 tablet (75 mg total) by mouth 2 (two) times daily.   60 tablet  5  . dronabinol (MARINOL) 2.5 MG capsule Take 1 capsule (2.5 mg total) by mouth 2 (two) times daily before lunch and supper.  60 capsule  0  . dronedarone (MULTAQ) 400 MG tablet Take 400 mg by mouth 2 (two) times daily with a meal.      . lisinopril (PRINIVIL,ZESTRIL) 5 MG tablet Take 1 tablet (5 mg total) by mouth daily.  90 tablet  3  . metoprolol succinate (TOPROL XL) 25 MG 24 hr tablet Take 1 tablet (25 mg total) by mouth daily.  90 tablet  3  . omeprazole (PRILOSEC) 20 MG capsule Take 40 mg by mouth daily.      [name] oxyCODONE-acetaminophen (PERCOCET) 10-325 MG per tablet Take 1 tablet by mouth every 6 (six) hours as needed for pain.  120 tablet  0  . tiotropium (SPIRIVA) 18 MCG inhalation capsule Place 1 capsule (18 mcg total) into inhaler and inhale daily.  30 capsule  11  . warfarin (COUMADIN) 5 MG tablet Take 2.5 tablets (12.5 mg total) by mouth daily.  30 tablet  1   Scheduled:  . buPROPion  75 mg Oral BID  . diltiazem  360 mg Oral Daily  . doxycycline  100 mg Oral Q12H  . dronabinol  2.5 mg Oral BID AC  . dronedarone  400 mg Oral BID WC  . feeding supplement (ENSURE COMPLETE)  237 mL Oral BID BM  . hydrocortisone cream   Topical BID  . lisinopril  5 mg Oral Daily  . metoprolol succinate  50 mg Oral Daily  . nicotine  7 mg Transdermal Daily  . pantoprazole  40 mg Oral Daily  . sodium chloride  3 mL Intravenous Q12H  . tiotropium  18 mcg Inhalation Daily  . Warfarin - Pharmacist Dosing Inpatient   Does not apply q1800   Infusions:  . diltiazem (CARDIZEM) infusion 10 mg/hr (05/25/13 1320)    Assessment: 68yo male admitted 1/3 for CP and HCAP now with subtherapeutic INR for Afib/flutter, to bridge with heparin.  Goal of Therapy:  Heparin level 0.3-0.7 units/ml Monitor platelets by anticoagulation protocol: Yes   Plan:  Will begin heparin gtt at 1000 units/hr and monitor heparin levels and CBC.  Wynona Neat, PharmD, BCPS  05/26/2013,7:36 AM

## 2013-05-26 NOTE — Progress Notes (Signed)
  Date: 05/26/2013  Patient name: Travis FERG Sr.  Medical record number: [id number]  Date of birth: Jan 10, 1946   This patient has been seen and the plan of care was discussed with the house staff. Dr. Harley Hallmark note is pending at this time.  Please see her note for complete details of today's plan.  [name] main issue is Afib/flutter with RVR in the setting of tachy-brady syndrome.  Since admission, he has been uptitrated on his metoprolol, started on cardizem and continued on multaq with continued Afib with breakthrough RVR.  Cardiology team is following (Dr. Rayann Heman).  They have requested starting heparin drip as we wait for therapeutic INR.  Also plan for cardioversion/TEE on Friday if INR therapeutic.  He further has had a mild COPD exacerbation which we are treating.   Sid Falcon, MD 05/26/2013, 5:45 PM

## 2013-05-26 NOTE — Progress Notes (Signed)
[facility]  Patient Status: Active (receiving services up to time of hospitalization)  [facility] is providing the following services: RN and PT - seen x2 by [facility] nurse before readmission  If patient discharges after hours, please call [phone].   Pearletha Forge 05/26/2013, 7:26 PM

## 2013-05-26 NOTE — Progress Notes (Signed)
SUBJECTIVE: The patient is doing well today.  At this time, he denies chest pain, shortness of breath, or any new concerns.  Ventricular rates better controlled - off IV Diltiazem today.  INR still sub therapeutic.   CURRENT MEDICATIONS: . buPROPion  75 mg Oral BID  . diltiazem  360 mg Oral Daily  . doxycycline  100 mg Oral Q12H  . dronabinol  2.5 mg Oral BID AC  . dronedarone  400 mg Oral BID WC  . feeding supplement (ENSURE COMPLETE)  237 mL Oral BID BM  . hydrocortisone cream   Topical BID  . lisinopril  5 mg Oral Daily  . metoprolol succinate  50 mg Oral Daily  . nicotine  7 mg Transdermal Daily  . pantoprazole  40 mg Oral Daily  . sodium chloride  3 mL Intravenous Q12H  . tiotropium  18 mcg Inhalation Daily  . Warfarin - Pharmacist Dosing Inpatient   Does not apply q1800   . diltiazem (CARDIZEM) infusion 10 mg/hr (05/25/13 1320)    OBJECTIVE: Physical Exam: Filed Vitals:   05/25/13 1938 05/25/13 2057 05/26/13 0437 05/26/13 0440  BP: 107/67 115/67  112/90  Pulse: 69 62  102  Temp: 99.2 F (37.3 C) 99.2 F (37.3 C)  98.2 F (36.8 C)  TempSrc: Oral Oral  Oral  Resp: 20 18  18   Height:      Weight:   150 lb 8 oz (68.266 kg)   SpO2: 100% 100%  98%    Intake/Output Summary (Last 24 hours) at 05/26/13 1610 Last data filed at 05/26/13 0437  Gross per 24 hour  Intake    760 ml  Output   1825 ml  Net  -1065 ml    Telemetry reveals atrial fibrillation, ventricular rates 80-110  GEN- The patient is well appearing, alert and oriented x 3 today.   Head- normocephalic, atraumatic Eyes-  Sclera clear, conjunctiva pink Ears- hearing intact Oropharynx- clear Neck- supple, no JVP Lymph- no cervical lymphadenopathy Lungs- Clear to ausculation bilaterally, normal work of breathing Heart- irregular rate and rhythm, no murmurs, rubs or gallops, PMI not laterally displaced GI- soft, NT, ND, + BS Extremities- no clubbing, cyanosis, or edema   LABS: Basic Metabolic  Panel:  Recent Labs  05/25/13 0825 05/26/13 0404  NA 135* 145  K 4.2 4.6  CL 96 107  CO2 23 27  GLUCOSE 166* 114*  BUN 23 25*  CREATININE 1.48* 1.71*  CALCIUM 8.5 8.6  MG 1.6 1.8   Liver Function Tests:  Recent Labs  05/24/13 0555  AST 18  ALT 22  ALKPHOS 58  BILITOT 0.2*  PROT 6.3  ALBUMIN 3.0*   CBC:  Recent Labs  05/24/13 0555  WBC 11.6*  HGB 12.8*  HCT 38.3*  MCV 91.6  PLT 175   Cardiac Enzymes:  Recent Labs  05/23/13 0800 05/25/13 0140  TROPONINI <0.30 <0.30   Thyroid Function Tests:  Recent Labs  05/24/13 0555  TSH 1.999    RADIOLOGY: Dg Chest 2 View  05/22/2013   CLINICAL DATA:  Chest pain with shortness of breath. History of pacemaker and congestive heart failure.  EXAM: CHEST  2 VIEW  COMPARISON:  05/17/2013 radiographs.  CT 10/12/2012.  FINDINGS: The right subclavian pacemaker leads appear unchanged within the right ventricle. The heart size and mediastinal contours are stable. There is chronic volume loss in the left hemithorax with parenchymal scarring, pleural thickening and pleural calcification. Compared with the prior studies, there is increased  density inferiorly suspicious for superimposed airspace disease. The right lung is clear.  IMPRESSION: Increased density in the lower left hemithorax compared with prior studies, suspicious for pneumonia superimposed on chronic pleural parenchymal scarring. Radiographic follow up recommended.   Electronically Signed   By: Camie Patience M.D.   On: 05/22/2013 20:46   ASSESSMENT AND PLAN:  Principal Problem:   COPD exacerbation Active Problems:   TOBACCO ABUSE   HYPERTENSION   PAF (paroxysmal atrial fibrillation) on chronic coumadin   BRADYCARDIA-TACHYCARDIA SYNDROME   COPD (chronic obstructive pulmonary disease)   G E R D   OSTEOARTHRITIS   HYPERTHYROIDISM   Chronic kidney disease   Chronic combined systolic and diastolic congestive heart failure   Non-ischemic cardiomyopathy   Atrial  fibrillation  1. Afib/ atrial flutter  He reports symptoms of fatigue but is mostly tolerant of his atrial arrhythmias.  Rate controlled with oral diltiazem Will add heparin drip until INR is therapeutic Plan for TEE guided cardioversion on Friday Hopefully INR will be therapeutic by then Continue multaq  2. Chronic systolic dysfunction  EF is improved  Continue rate control   3. Prior DVT/PE  Continue long term anticoagulation  As above

## 2013-05-26 NOTE — Care Management Note (Unsigned)
    Page 1 of 1   05/26/2013     4:32:36 PM   CARE MANAGEMENT NOTE 05/26/2013  Patient:  [name], [name]   Account Number:  [id number]  Date Initiated:  05/26/2013  Documentation initiated by:  Saralynn Langhorst  Subjective/Objective Assessment:   PT ADM ON 1/3 WITH AFIB WITH RVR. PTA, PT INDEPENDENT, LIVES WITH FAMILY.     Action/Plan:   WILL FOLLOW FOR DISCHARGE NEEDS AS PT PROGRESSES.  PLANNING TEE/CV FOR FRIDAY 1/9.   Anticipated DC Date:  05/29/2013   Anticipated DC Plan:  [place]  CM consult      Choice offered to / List presented to:             Status of service:  In process, will continue to follow Medicare Important Message given?   (If response is "NO", the following Medicare IM given date fields will be blank) Date Medicare IM given:   Date Additional Medicare IM given:    Discharge Disposition:    Per UR Regulation:  Reviewed for med. necessity/level of care/duration of stay  If discussed at [place] of Stay Meetings, dates discussed:    Comments:

## 2013-05-26 NOTE — Progress Notes (Signed)
ANTICOAGULATION CONSULT NOTE - Follow up [name] for heparin bridge Indication: atrial fibrillation  Allergies  Allergen Reactions  . Digoxin And Related     Dig toxicity 2009  . Penicillins Rash    Patient Measurements: Height: 6\' 3"  (190.5 cm) Weight: 150 lb 8 oz (68.266 kg) IBW/kg (Calculated) : 84.5  Vital Signs: Temp: 97.9 F (36.6 C) (01/07 1602) Temp src: Oral (01/07 1602) BP: 128/72 mmHg (01/07 1602) Pulse Rate: 99 (01/07 1602)  Labs:  Recent Labs  05/24/13 0555 05/25/13 0140 05/25/13 0322 05/25/13 0825 05/26/13 0404 05/26/13 1705  HGB 12.8*  --   --   --   --   --   HCT 38.3*  --   --   --   --   --   PLT 175  --   --   --   --   --   LABPROT 27.1*  --  19.7*  --  18.5*  --   INR 2.62*  --  1.72*  --  1.59*  --   HEPARINUNFRC  --   --   --   --   --  0.18*  CREATININE 1.48*  --   --  1.48* 1.71*  --   TROPONINI  --  <0.30  --   --   --   --     Estimated Creatinine Clearance: 39.4 ml/min (by C-G formula based on Cr of 1.71).    Assessment: 68yo male admitted 1/3 for CP and HCAP on coumadin now with subtherapeutic INR  of 1.59 for Afib/flutter, on heparin bridge. No bleeding noted, CBC stable. Plan is to do TEE guided cardioversion on Friday.   Goal of Therapy:  Heparin level 0.3-0.7 units/ml Monitor platelets by anticoagulation protocol: Yes   Plan:  Increase heparin rate to 1250 units/hr Check HL in 6 hours Daily HL Daily CBC  Hughes Better, PharmD, BCPS Clinical Pharmacist 05/26/2013 6:18 PM

## 2013-05-26 NOTE — Progress Notes (Signed)
ANTICOAGULATION CONSULT NOTE - Follow up [name] for heparin/coumadin  Indication: atrial fibrillation  Allergies  Allergen Reactions  . Digoxin And Related     Dig toxicity 2009  . Penicillins Rash    Patient Measurements: Height: 6\' 3"  (190.5 cm) Weight: 150 lb 8 oz (68.266 kg) IBW/kg (Calculated) : 84.5  Vital Signs: Temp: 98.2 F (36.8 C) (01/07 0440) Temp src: Oral (01/07 0440) BP: 112/90 mmHg (01/07 0440) Pulse Rate: 102 (01/07 0440)  Labs:  Recent Labs  05/24/13 0555 05/25/13 0140 05/25/13 0322 05/25/13 0825 05/26/13 0404  HGB 12.8*  --   --   --   --   HCT 38.3*  --   --   --   --   PLT 175  --   --   --   --   LABPROT 27.1*  --  19.7*  --  18.5*  INR 2.62*  --  1.72*  --  1.59*  CREATININE 1.48*  --   --  1.48* 1.71*  TROPONINI  --  <0.30  --   --   --     Estimated Creatinine Clearance: 39.4 ml/min (by C-G formula based on Cr of 1.71).    Assessment: 67yo male admitted 1/3 for CP and HCAP on coumadin now with subtherapeutic INR  Of 1.59 for Afib/flutter. Bridging on heparin at 1000 units/hr. INR trended down today but has slowed down. Anticipate trend up tomorrow. No bleeding noted. Plan is to do TEE guided cardioversion on Friday.   Goal of Therapy:  Heparin level 0.3-0.7 units/ml Monitor platelets by anticoagulation protocol: Yes   Plan:  -Continue heparin drip at 1000 units/hr  -Coumadin 15 mg x 1 dose tonight -F/u HL at 1630 tonight -F/u daily INR and s/s of bleeding.   Albertina Parr, PharmD.  Clinical Pharmacist Pager [phone]

## 2013-05-26 NOTE — Progress Notes (Addendum)
Subjective:  Pt seen and examined in AM.  No acute events overnight. Pt reports sometimes feeling of flutter in his chest and fatigue. He reports a pink tinge to his cough, but not sure if blood. He still reports difficulty sleeping.     Objective: Vital signs in last 24 hours: Filed Vitals:   05/25/13 2057 05/26/13 0437 05/26/13 0440 05/26/13 1300  BP: 115/67  112/90 133/77  Pulse: 62  102 94  Temp: 99.2 F (37.3 C)  98.2 F (36.8 C) 98.5 F (36.9 C)  TempSrc: Oral  Oral Oral  Resp: 18  18 18   Height:      Weight:  68.266 kg (150 lb 8 oz)    SpO2: 100%  98% 98%   Weight change: -2.359 kg (-5 lb 3.2 oz)  Intake/Output Summary (Last 24 hours) at 05/26/13 1414 Last data filed at 05/26/13 1300  Gross per 24 hour  Intake    480 ml  Output   1525 ml  Net  -1045 ml   Vitals reviewed. General: resting in bed, NAD, on Fate 2L O2 HEENT: PERRL, EOMI Cardiac: normal rate, irregularly irregular Pulm:  rhonchi and diffuse expiratory wheezing Abd: soft, nontender, nondistended, BS present Ext: warm and well perfused, no pedal edema Neuro: alert and oriented X3  Lab Results: Basic Metabolic Panel:  Recent Labs Lab 05/25/13 0825 05/26/13 0404  NA 135* 145  K 4.2 4.6  CL 96 107  CO2 23 27  GLUCOSE 166* 114*  BUN 23 25*  CREATININE 1.48* 1.71*  CALCIUM 8.5 8.6  MG 1.6 1.8   CBC:  Recent Labs Lab 05/22/13 1920 05/24/13 0555  WBC 11.7* 11.6*  HGB 14.2 12.8*  HCT 42.9 38.3*  MCV 94.1 91.6  PLT 175 175   Cardiac Enzymes:  Recent Labs Lab 05/23/13 0800 05/25/13 0140  TROPONINI <0.30 <0.30   BNP:  Recent Labs Lab 05/22/13 1920  PROBNP 9662.0*   CBG:  Recent Labs Lab 05/25/13 1624  GLUCAP 96   Coagulation:  Recent Labs Lab 05/23/13 0800 05/24/13 0555 05/25/13 0322 05/26/13 0404  LABPROT 23.5* 27.1* 19.7* 18.5*  INR 2.17* 2.62* 1.72* 1.59*   Urine Drug Screen: Drugs of Abuse     Component Value Date/Time   LABOPIA POSITIVE* 10/12/2012  1724   LABOPIA NEGATIVE 12/27/2008 1943   COCAINSCRNUR NONE DETECTED 10/12/2012 1724   COCAINSCRNUR NEG 07/06/2009 2225   LABBENZ NONE DETECTED 10/12/2012 1724   LABBENZ NEG 07/06/2009 2225   AMPHETMU NONE DETECTED 10/12/2012 1724   AMPHETMU NEG 07/06/2009 2225   THCU POSITIVE* 10/12/2012 1724   LABBARB NONE DETECTED 10/12/2012 1724    Studies/Results: No results found. Medications: I have reviewed the patient's current medications. Scheduled Meds: . buPROPion  75 mg Oral BID  . diltiazem  360 mg Oral Daily  . doxycycline  100 mg Oral Q12H  . dronabinol  2.5 mg Oral BID AC  . dronedarone  400 mg Oral BID WC  . feeding supplement (ENSURE COMPLETE)  237 mL Oral BID BM  . hydrocortisone cream   Topical BID  . lisinopril  5 mg Oral Daily  . metoprolol succinate  50 mg Oral Daily  . nicotine  7 mg Transdermal Daily  . pantoprazole  40 mg Oral Daily  . sodium chloride  3 mL Intravenous Q12H  . tiotropium  18 mcg Inhalation Daily  . warfarin  15 mg Oral ONCE-1800  . Warfarin - Pharmacist Dosing Inpatient   Does not apply [phone]  Continuous Infusions: . [START ON 05/27/2013] sodium chloride    . diltiazem (CARDIZEM) infusion 10 mg/hr (05/25/13 1320)  . heparin 1,000 Units/hr (05/26/13 0834)   PRN Meds:.ipratropium, levalbuterol, oxyCODONE, oxyCODONE-acetaminophen, zolpidem Assessment/Plan: Principal Problem:   COPD exacerbation Active Problems:   TOBACCO ABUSE   HYPERTENSION   PAF (paroxysmal atrial fibrillation) on chronic coumadin   BRADYCARDIA-TACHYCARDIA SYNDROME   COPD (chronic obstructive pulmonary disease)   G E R D   OSTEOARTHRITIS   HYPERTHYROIDISM   Chronic kidney disease   Chronic combined systolic and diastolic congestive heart failure   Non-ischemic cardiomyopathy   Atrial fibrillation  Assessment: Mr. Lia is a 68 year old male with PMH of Atrial fibrillation (on coumadin with sub-therapeutic INR), HTN, CKD 3, COPD, tachy-brady syndrome (with pacemaker),  non-ischemic cardiomyopathy, and diastolic HF (EF 89-38% Dec 2014) admitted for atrial flutter/fib RVR.   Plan:   Atrial flutter in setting of tachy-brady syndrome--currently in atrial fibrillation, HR 99. Most likely due to decrease in metoprolol from 50 to 25 mg daily and re-setting of pacemaker on last admission. TSH wnl. -Continue metoprolol succinate 50 mg daily  -Continue dronedarone 400 mg BID  -Continue PO diltiazem 360 mg daily  -Appreciate cardiology recommendations --> TEE cardioversion tomm or Friday once thearputei cINR -Continue coumadin per pharmacy and monitoring INR, subtherapeutic today 1.591--> start IV heparin per cardiology until therapeutic INR -Continue to monitor on telemetry  Hemoptysis - Pt reports pink to dark tinged sputum x 1 day -Obtain CBC  -Continue to monitor in setting of IV heparin on coumadi, currently with sub- therapeutic INR    COPD Mild Exacerbation - still with wheezing. Pt with chronic cough and dyspnea with exertion s/p 5 day azithromycin and prednisone course.  HCAP unlikely giving chronicity of CXR and relatively unchanged respiratory symptoms from previous admission. Pt with +urine strep pneumoniae and -urine legionella Ag. - Awaiting sputum culture  - Continue doxycycline 100 mg po bid Day 4/5 - Oxygen therapy to keep SpO2 >92% - Breathing treatment Q6 hr to Q4hr - Continue home Spiriva daily    Combined Systolic and Diastolic CHF - Pt with 2D-echo on 12/30 with improved systolic function EF 10-17%, no wall motion, and inability to assess diastolic function. Pt without signs of volume overload on exam or CXR.  -Continue metoprolol succinate 50 mg daily   -Monitor daily weight (155 to 150) and I & O's (2.2L out yesterday)  Chronic Kidney Disease Stage 3 -  Uptrending. Pt with Cr 1.48 to 1.71, still below baseline Cr 2.0 -Avoid nephrotoxins -Continue to monitor renal function  Hypertension - currently normotensive  -Continue home lisinopril  5 mg daily  -Continue 50 mg metoprolol succinate   -Continue PO diltiazem 360 mg daily   Severe Malnutrition - Pt with BMI of 17.36 indicating underweight and meeting severe malnutrition criteria (14% weight loss in 4 months). Pt reported unintentional 50 lb weight loss in last year with work-up to date negative  -Continue dronabinol 2.5 mg BID 4-week trial  -Nutrition supplements  Leukocytosis - Most likely due to recent corticosteroid course. Pt also with +Strep Pneum urine antigen -Continue to monitor CBC  Insomnia - Pt reports difficulty falling and staying asleep  -D/c ambien 5 mg PRN trial due to nightmares -Start benadryl capsule  PRN   Rash - Pt with pruritic right lower abdominal quadrant and thoracic area for 2 months of unknown etiology. -Apply hydrocortisone 0.5% cream BID    Diet: Regular DVT Ppx: Coumadin Code: Full  Dispo: Disposition is deferred at this time, awaiting improvement of current medical problems.  Anticipated discharge in approximately 2-3 day(s).   The patient does have a current PCP Axel Filler, MD) and does need an [facility] hospital follow-up appointment after discharge.  The patient does not have transportation limitations that hinder transportation to clinic appointments.  Services Needed at time of discharge: Y = Yes, Blank = No PT:   OT:   RN:   Equipment:   Other:     LOS: 4 days   Juluis Mire, MD 05/26/2013, 2:14 PM

## 2013-05-27 ENCOUNTER — Encounter (HOSPITAL_COMMUNITY): Payer: Self-pay | Admitting: *Deleted

## 2013-05-27 ENCOUNTER — Encounter (HOSPITAL_COMMUNITY)
Admission: EM | Disposition: A | Discharge status: Home Health | Payer: Self-pay | Source: Home / Self Care | Attending: Internal Medicine

## 2013-05-27 ENCOUNTER — Inpatient Hospital Stay (HOSPITAL_COMMUNITY): Payer: Medicare HMO | Admitting: Anesthesiology

## 2013-05-27 ENCOUNTER — Inpatient Hospital Stay (HOSPITAL_COMMUNITY): Payer: Medicare HMO

## 2013-05-27 ENCOUNTER — Encounter (HOSPITAL_COMMUNITY): Payer: Medicare HMO | Admitting: Anesthesiology

## 2013-05-27 HISTORY — PX: TEE WITHOUT CARDIOVERSION: SHX5443

## 2013-05-27 HISTORY — PX: CARDIOVERSION: SHX1299

## 2013-05-27 LAB — BASIC METABOLIC PANEL
BUN: 21 mg/dL (ref 6–23)
CHLORIDE: 104 meq/L (ref 96–112)
CO2: 26 mEq/L (ref 19–32)
CREATININE: 1.62 mg/dL — AB (ref 0.50–1.35)
Calcium: 8.6 mg/dL (ref 8.4–10.5)
GFR calc Af Amer: 48 mL/min — ABNORMAL LOW (ref >90)
GFR calc non Af Amer: 42 mL/min — ABNORMAL LOW (ref >90)
Glucose, Bld: 95 mg/dL (ref 70–99)
Potassium: 5.3 mEq/L (ref 3.7–5.3)
Sodium: 141 mEq/L (ref 137–147)

## 2013-05-27 LAB — HEPARIN LEVEL (UNFRACTIONATED): HEPARIN UNFRACTIONATED: 0.43 [IU]/mL (ref 0.30–0.70)

## 2013-05-27 LAB — CBC
HCT: 39.7 % (ref 39.0–52.0)
HEMATOCRIT: 43.4 % (ref 39.0–52.0)
HEMOGLOBIN: 13.4 g/dL (ref 13.0–17.0)
Hemoglobin: 14.4 g/dL (ref 13.0–17.0)
MCH: 30.7 pg (ref 26.0–34.0)
MCH: 30.9 pg (ref 26.0–34.0)
MCHC: 33.8 g/dL (ref 30.0–36.0)
MCHC: 33.2 g/dL (ref 30.0–36.0)
MCV: 91.1 fL (ref 78.0–100.0)
MCV: 93.1 fL (ref 78.0–100.0)
Platelets: 177 10*3/uL (ref 150–400)
Platelets: 187 10*3/uL (ref 150–400)
RBC: 4.36 MIL/uL (ref 4.22–5.81)
RBC: 4.66 MIL/uL (ref 4.22–5.81)
RDW: 15.3 % (ref 11.5–15.5)
RDW: 15.3 % (ref 11.5–15.5)
WBC: 5.9 10*3/uL (ref 4.0–10.5)
WBC: 8.2 10*3/uL (ref 4.0–10.5)

## 2013-05-27 LAB — GLUCOSE, CAPILLARY: Glucose-Capillary: 84 mg/dL (ref 70–99)

## 2013-05-27 LAB — PROTIME-INR
INR: 2.67 — ABNORMAL HIGH (ref 0.00–1.49)
Prothrombin Time: 27.5 seconds — ABNORMAL HIGH (ref 11.6–15.2)

## 2013-05-27 SURGERY — ECHOCARDIOGRAM, TRANSESOPHAGEAL
Anesthesia: Moderate Sedation

## 2013-05-27 MED ORDER — FENTANYL CITRATE 0.05 MG/ML IJ SOLN
INTRAMUSCULAR | Status: DC | PRN
  Administered 2013-05-27: 25 ug via INTRAVENOUS

## 2013-05-27 MED ORDER — HYDROMORPHONE HCL PF 1 MG/ML IJ SOLN
0.5000 mg | Freq: Once | INTRAMUSCULAR | Status: AC
  Administered 2013-05-27: 0.5 mg via INTRAVENOUS
  Filled 2013-05-27: qty 1

## 2013-05-27 MED ORDER — IPRATROPIUM BROMIDE 0.02 % IN SOLN: 0.5000 mg | Freq: Four times a day (QID) | RESPIRATORY_TRACT | Status: DC | PRN

## 2013-05-27 MED ORDER — LIDOCAINE HCL (CARDIAC) 20 MG/ML IV SOLN
INTRAVENOUS | Status: DC | PRN
  Administered 2013-05-27: 20 mg via INTRAVENOUS

## 2013-05-27 MED ORDER — BUTAMBEN-TETRACAINE-BENZOCAINE 2-2-14 % EX AERO
INHALATION_SPRAY | CUTANEOUS | Status: DC | PRN
  Administered 2013-05-27: 2 via TOPICAL

## 2013-05-27 MED ORDER — MIDAZOLAM HCL 5 MG/ML IJ SOLN
INTRAMUSCULAR | Status: AC
  Filled 2013-05-27: qty 1

## 2013-05-27 MED ORDER — FENTANYL CITRATE 0.05 MG/ML IJ SOLN
INTRAMUSCULAR | Status: AC
  Filled 2013-05-27: qty 2

## 2013-05-27 MED ORDER — WARFARIN SODIUM 5 MG PO TABS
5.0000 mg | ORAL_TABLET | Freq: Once | ORAL | Status: AC
  Administered 2013-05-27: 5 mg via ORAL
  Filled 2013-05-27 (×2): qty 1

## 2013-05-27 MED ORDER — PROPOFOL 10 MG/ML IV BOLUS
INTRAVENOUS | Status: DC | PRN
  Administered 2013-05-27: 50 mg via INTRAVENOUS

## 2013-05-27 MED ORDER — WARFARIN SODIUM 6 MG PO TABS
6.0000 mg | ORAL_TABLET | Freq: Once | ORAL | Status: DC
  Filled 2013-05-27: qty 1

## 2013-05-27 MED ORDER — MIDAZOLAM HCL 10 MG/2ML IJ SOLN
INTRAMUSCULAR | Status: DC | PRN
  Administered 2013-05-27: 1 mg via INTRAVENOUS

## 2013-05-27 MED ORDER — PREDNISONE 10 MG PO TABS
60.0000 mg | ORAL_TABLET | Freq: Every day | ORAL | Status: AC
  Administered 2013-05-27 – 2013-05-29 (×3): 60 mg via ORAL
  Filled 2013-05-27 (×3): qty 1

## 2013-05-27 NOTE — Anesthesia Preprocedure Evaluation (Addendum)
Anesthesia Evaluation  Patient identified by MRN, date of birth, ID band Patient awake    Reviewed: Allergy & Precautions, H&P , NPO status , Patient's Chart, lab work & pertinent test results  History of Anesthesia Complications Negative for: history of anesthetic complications  Airway Mallampati: II TM Distance: >3 FB Neck ROM: Full    Dental  (+) Edentulous Upper and Edentulous Lower   Pulmonary COPDCurrent Smoker,  breath sounds clear to auscultation  Pulmonary exam normal       Cardiovascular hypertension, Pt. on medications and Pt. on home beta blockers + Peripheral Vascular Disease and + Orthopnea + dysrhythmias (INR 2.67) Atrial Fibrillation + pacemaker Rhythm:Irregular Rate:Normal  TEE today: normal LVF   Neuro/Psych Depression Bipolar Disorder    GI/Hepatic GERD-  Medicated and Controlled,  Endo/Other  Hyperthyroidism   Renal/GU Renal InsufficiencyRenal disease (creat 1.62, K+ 5.3)     Musculoskeletal   Abdominal   Peds  Hematology   Anesthesia Other Findings   Reproductive/Obstetrics                          Anesthesia Physical Anesthesia Plan  ASA: III  Anesthesia Plan: General   Post-op Pain Management:    Induction: Intravenous  Airway Management Planned: Mask and Natural Airway  Additional Equipment:   Intra-op Plan:   Post-operative Plan:   Informed Consent: I have reviewed the patients History and Physical, chart, labs and discussed the procedure including the risks, benefits and alternatives for the proposed anesthesia with the patient or authorized representative who has indicated his/her understanding and acceptance.     Plan Discussed with: CRNA and Surgeon  Anesthesia Plan Comments: (Plan routine monitors, GA for cardioversion)        Anesthesia Quick Evaluation

## 2013-05-27 NOTE — Progress Notes (Signed)
ANTICOAGULATION CONSULT NOTE - Follow Up Consult  Pharmacy Consult for Heparin/Warfarin  Indication: atrial fibrillation  Allergies  Allergen Reactions  . Digoxin And Related     Dig toxicity 2009  . Penicillins Rash   Patient Measurements: Height: 6\' 3"  (190.5 cm) Weight: 153 lb 11.2 oz (69.718 kg) IBW/kg (Calculated) : 84.5  Vital Signs: Temp: 98.7 F (37.1 C) (01/08 0338) Temp src: Oral (01/08 0338) BP: 120/78 mmHg (01/08 0338) Pulse Rate: 82 (01/08 0338)  Labs:  Recent Labs  05/24/13 0555 05/25/13 0140 05/25/13 0322 05/25/13 0825 05/26/13 0404 05/26/13 1705 05/27/13 0415  HGB 12.8*  --   --   --   --   --   --   HCT 38.3*  --   --   --   --   --   --   PLT 175  --   --   --   --   --   --   LABPROT 27.1*  --  19.7*  --  18.5*  --  27.5*  INR 2.62*  --  1.72*  --  1.59*  --  2.67*  HEPARINUNFRC  --   --   --   --   --  0.18* 0.43  CREATININE 1.48*  --   --  1.48* 1.71*  --   --   TROPONINI  --  <0.30  --   --   --   --   --     Estimated Creatinine Clearance: 40.2 ml/min (by C-G formula based on Cr of 1.71).   Medications:  Heparin 1250 units/hr  Assessment: 68 y/o M on heparin/warfarin for afib. Plan for TEE guided cardioversion on Friday. HL is 0.43, INR is now therapeutic at 2.67 (large jump from 1.59), other labs as above.   Goal of Therapy:  Heparin level 0.3-0.7 units/ml Monitor platelets by anticoagulation protocol: Yes   Plan:  -DC heparin as order was to bridge until therapeutic INR achieved (INR 2.67 this AM) -Warfarin per previous note  Narda Bonds 05/27/2013,5:36 AM

## 2013-05-27 NOTE — Progress Notes (Signed)
SUBJECTIVE: The patient is doing well today.  At this time, he denies chest pain, shortness of breath, or any new concerns.  Ventricular rates controlled - for TEE/DCCV today.   INR 2.67 today - bridged with IV Heparin yesterday.   CURRENT MEDICATIONS: . buPROPion  75 mg Oral BID  . diltiazem  360 mg Oral Daily  . doxycycline  100 mg Oral Q12H  . dronabinol  2.5 mg Oral BID AC  . dronedarone  400 mg Oral BID WC  . feeding supplement (ENSURE COMPLETE)  237 mL Oral BID BM  . hydrocortisone cream   Topical BID  . lisinopril  5 mg Oral Daily  . metoprolol succinate  50 mg Oral Daily  . nicotine  7 mg Transdermal Daily  . pantoprazole  40 mg Oral Daily  . sodium chloride  3 mL Intravenous Q12H  . tiotropium  18 mcg Inhalation Daily  . Warfarin - Pharmacist Dosing Inpatient   Does not apply q1800   . sodium chloride    . diltiazem (CARDIZEM) infusion 10 mg/hr (05/25/13 1320)    OBJECTIVE: Physical Exam: Filed Vitals:   05/26/13 1300 05/26/13 1602 05/26/13 2123 05/27/13 0338  BP: 133/77 128/72 122/78 120/78  Pulse: 94 99 70 82  Temp: 98.5 F (36.9 C) 97.9 F (36.6 C) 98.2 F (36.8 C) 98.7 F (37.1 C)  TempSrc: Oral Oral Oral Oral  Resp: 18 18 18 18   Height:      Weight:    153 lb 11.2 oz (69.718 kg)  SpO2: 98% 99% 98% 95%    Intake/Output Summary (Last 24 hours) at 05/27/13 0998 Last data filed at 05/27/13 0300  Gross per 24 hour  Intake    720 ml  Output   1625 ml  Net   -905 ml    Telemetry reveals atrial fibrillation, ventricular rates 70-100  GEN- The patient is well appearing, alert and oriented x 3 today.   Head- normocephalic, atraumatic Eyes-  Sclera clear, conjunctiva pink Ears- hearing intact Oropharynx- clear Neck- supple, no JVP Lymph- no cervical lymphadenopathy Lungs- Clear to ausculation bilaterally, normal work of breathing Heart- irregular rate and rhythm, no murmurs, rubs or gallops, PMI not laterally displaced GI- soft, NT, ND, +  BS Extremities- no clubbing, cyanosis, or edema   LABS: Basic Metabolic Panel:  Recent Labs  05/25/13 0825 05/26/13 0404  NA 135* 145  K 4.2 4.6  CL 96 107  CO2 23 27  GLUCOSE 166* 114*  BUN 23 25*  CREATININE 1.48* 1.71*  CALCIUM 8.5 8.6  MG 1.6 1.8   Cardiac Enzymes:  Recent Labs  05/25/13 0140  TROPONINI <0.30    RADIOLOGY: Dg Chest 2 View 05/22/2013   CLINICAL DATA:  Chest pain with shortness of breath. History of pacemaker and congestive heart failure.  EXAM: CHEST  2 VIEW  COMPARISON:  05/17/2013 radiographs.  CT 10/12/2012.  FINDINGS: The right subclavian pacemaker leads appear unchanged within the right ventricle. The heart size and mediastinal contours are stable. There is chronic volume loss in the left hemithorax with parenchymal scarring, pleural thickening and pleural calcification. Compared with the prior studies, there is increased density inferiorly suspicious for superimposed airspace disease. The right lung is clear.  IMPRESSION: Increased density in the lower left hemithorax compared with prior studies, suspicious for pneumonia superimposed on chronic pleural parenchymal scarring. Radiographic follow up recommended.   Electronically Signed   By: Camie Patience M.D.   On: 05/22/2013 20:46   ASSESSMENT  AND PLAN:  Principal Problem:   COPD exacerbation Active Problems:   TOBACCO ABUSE   HYPERTENSION   PAF (paroxysmal atrial fibrillation) on chronic coumadin   BRADYCARDIA-TACHYCARDIA SYNDROME   COPD (chronic obstructive pulmonary disease)   G E R D   OSTEOARTHRITIS   HYPERTHYROIDISM   Chronic kidney disease   Chronic combined systolic and diastolic congestive heart failure   Non-ischemic cardiomyopathy   Atrial fibrillation  1. Afib/ atrial flutter  He reports symptoms of fatigue but is mostly tolerant of his atrial arrhythmias.   Rate controlled with oral diltiazem. INR is now therapeutic.  RIsks, benefits, and alternatives to TEE guided cardioversion  were discussed at length with the patient who wishes to proceed. Plan for TEE guided cardioversion today Continue multaq  2. Chronic systolic dysfunction  EF is improved  Continue rate control   3. Prior DVT/PE  Continue long term anticoagulation  As above  

## 2013-05-27 NOTE — Interval H&P Note (Signed)
History and Physical Interval Note:  05/27/2013 1:15 PM  Travis Reason Sr.  has presented today for surgery, with the diagnosis of a-fib  The various methods of treatment have been discussed with the patient and family. After consideration of risks, benefits and other options for treatment, the patient has consented to  Procedure(s): TRANSESOPHAGEAL ECHOCARDIOGRAM (TEE) (N/A) CARDIOVERSION (N/A) as a surgical intervention .  The patient's history has been reviewed, patient examined, no change in status, stable for surgery.  I have reviewed the patient's chart and labs.  Questions were answered to the patient's satisfaction.     Dorr Perrot

## 2013-05-27 NOTE — Transfer of Care (Signed)
Immediate Anesthesia Transfer of Care Note  Patient: Travis CAZAREZ Sr.  Procedure(s) Performed: Procedure(s): TRANSESOPHAGEAL ECHOCARDIOGRAM (TEE) (N/A) CARDIOVERSION (N/A)  Patient Location: Endoscopy Unit  Anesthesia Type:MAC  Level of Consciousness: awake  Airway & Oxygen Therapy: Patient Spontanous Breathing and Patient connected to nasal cannula oxygen  Post-op Assessment: Report given to PACU RN and Post -op Vital signs reviewed and stable  Post vital signs: Reviewed and stable  Complications: No apparent anesthesia complications

## 2013-05-27 NOTE — Anesthesia Postprocedure Evaluation (Signed)
  Anesthesia Post-op Note  Patient: Travis ALTIER Sr.  Procedure(s) Performed: Procedure(s): TRANSESOPHAGEAL ECHOCARDIOGRAM (TEE) (N/A) CARDIOVERSION (N/A)  Patient Location: PACU  Anesthesia Type:General  Level of Consciousness: awake, alert , oriented and patient cooperative  Airway and Oxygen Therapy: Patient Spontanous Breathing and Patient connected to nasal cannula oxygen  Post-op Pain: none  Post-op Assessment: Post-op Vital signs reviewed, Patient's Cardiovascular Status Stable, Respiratory Function Stable, Patent Airway, No signs of Nausea or vomiting and Pain level controlled  Post-op Vital Signs: Reviewed and stable  Complications: No apparent anesthesia complications

## 2013-05-27 NOTE — CV Procedure (Signed)
Dx. AFIB MEDS: Multaq and Toprol Pacer St Jude - Right shoulder - single lead.      Electrical Cardioversion Procedure Note [name] [id number] Apr 12, 1946  Procedure: Electrical Cardioversion Indications:  Atrial Fibrillation  Time Out: Verified patient identification, verified procedure,medications/allergies/relevent history reviewed, required imaging and test results available.  Performed  Procedure Details  The patient was NPO after midnight. Anesthesia was administered at the beside  by Dr.Jackson with 50mg  of propofol.  Cardioversion was performed with synchronized biphasic defibrillation via AP pads with 120, 150, 200 joules.  3 attempt(s) were performed.  The patient converted to briefly to normal sinus rhythm however after 5-10 seconds following each attempt he reverted back to AFIB. The patient tolerated the procedure well. St. Jude device was interrogate immediately post procedure and working normally.   IMPRESSION:  Failed cardioversion   [name] 05/27/2013, 1:56 PM

## 2013-05-27 NOTE — Progress Notes (Signed)
Pt refused to have heparin level drawn at this time. Pt told Lab he would not have it drawn now.

## 2013-05-27 NOTE — Progress Notes (Signed)
  Date: 05/27/2013  Patient name: Travis MCCUISTON Sr.  Medical record number: [id number]  Date of birth: Aug 05, 1945   This patient has been seen and the plan of care was discussed with the house staff. Please see their note for complete details. I concur with their findings.  TEE with cardioversion by Cardiology today.   Sid Falcon, MD 05/27/2013, 4:04 PM

## 2013-05-27 NOTE — H&P (View-Only) (Signed)
SUBJECTIVE: The patient is doing well today.  At this time, he denies chest pain, shortness of breath, or any new concerns.  Ventricular rates controlled - for TEE/DCCV today.   INR 2.67 today - bridged with IV Heparin yesterday.   CURRENT MEDICATIONS: . buPROPion  75 mg Oral BID  . diltiazem  360 mg Oral Daily  . doxycycline  100 mg Oral Q12H  . dronabinol  2.5 mg Oral BID AC  . dronedarone  400 mg Oral BID WC  . feeding supplement (ENSURE COMPLETE)  237 mL Oral BID BM  . hydrocortisone cream   Topical BID  . lisinopril  5 mg Oral Daily  . metoprolol succinate  50 mg Oral Daily  . nicotine  7 mg Transdermal Daily  . pantoprazole  40 mg Oral Daily  . sodium chloride  3 mL Intravenous Q12H  . tiotropium  18 mcg Inhalation Daily  . Warfarin - Pharmacist Dosing Inpatient   Does not apply q1800   . sodium chloride    . diltiazem (CARDIZEM) infusion 10 mg/hr (05/25/13 1320)    OBJECTIVE: Physical Exam: Filed Vitals:   05/26/13 1300 05/26/13 1602 05/26/13 2123 05/27/13 0338  BP: 133/77 128/72 122/78 120/78  Pulse: 94 99 70 82  Temp: 98.5 F (36.9 C) 97.9 F (36.6 C) 98.2 F (36.8 C) 98.7 F (37.1 C)  TempSrc: Oral Oral Oral Oral  Resp: 18 18 18 18   Height:      Weight:    153 lb 11.2 oz (69.718 kg)  SpO2: 98% 99% 98% 95%    Intake/Output Summary (Last 24 hours) at 05/27/13 0998 Last data filed at 05/27/13 0300  Gross per 24 hour  Intake    720 ml  Output   1625 ml  Net   -905 ml    Telemetry reveals atrial fibrillation, ventricular rates 70-100  GEN- The patient is well appearing, alert and oriented x 3 today.   Head- normocephalic, atraumatic Eyes-  Sclera clear, conjunctiva pink Ears- hearing intact Oropharynx- clear Neck- supple, no JVP Lymph- no cervical lymphadenopathy Lungs- Clear to ausculation bilaterally, normal work of breathing Heart- irregular rate and rhythm, no murmurs, rubs or gallops, PMI not laterally displaced GI- soft, NT, ND, +  BS Extremities- no clubbing, cyanosis, or edema   LABS: Basic Metabolic Panel:  Recent Labs  05/25/13 0825 05/26/13 0404  NA 135* 145  K 4.2 4.6  CL 96 107  CO2 23 27  GLUCOSE 166* 114*  BUN 23 25*  CREATININE 1.48* 1.71*  CALCIUM 8.5 8.6  MG 1.6 1.8   Cardiac Enzymes:  Recent Labs  05/25/13 0140  TROPONINI <0.30    RADIOLOGY: Dg Chest 2 View 05/22/2013   CLINICAL DATA:  Chest pain with shortness of breath. History of pacemaker and congestive heart failure.  EXAM: CHEST  2 VIEW  COMPARISON:  05/17/2013 radiographs.  CT 10/12/2012.  FINDINGS: The right subclavian pacemaker leads appear unchanged within the right ventricle. The heart size and mediastinal contours are stable. There is chronic volume loss in the left hemithorax with parenchymal scarring, pleural thickening and pleural calcification. Compared with the prior studies, there is increased density inferiorly suspicious for superimposed airspace disease. The right lung is clear.  IMPRESSION: Increased density in the lower left hemithorax compared with prior studies, suspicious for pneumonia superimposed on chronic pleural parenchymal scarring. Radiographic follow up recommended.   Electronically Signed   By: Camie Patience M.D.   On: 05/22/2013 20:46   ASSESSMENT  AND PLAN:  Principal Problem:   COPD exacerbation Active Problems:   TOBACCO ABUSE   HYPERTENSION   PAF (paroxysmal atrial fibrillation) on chronic coumadin   BRADYCARDIA-TACHYCARDIA SYNDROME   COPD (chronic obstructive pulmonary disease)   G E R D   OSTEOARTHRITIS   HYPERTHYROIDISM   Chronic kidney disease   Chronic combined systolic and diastolic congestive heart failure   Non-ischemic cardiomyopathy   Atrial fibrillation  1. Afib/ atrial flutter  He reports symptoms of fatigue but is mostly tolerant of his atrial arrhythmias.   Rate controlled with oral diltiazem. INR is now therapeutic.  RIsks, benefits, and alternatives to TEE guided cardioversion  were discussed at length with the patient who wishes to proceed. Plan for TEE guided cardioversion today Continue multaq  2. Chronic systolic dysfunction  EF is improved  Continue rate control   3. Prior DVT/PE  Continue long term anticoagulation  As above

## 2013-05-27 NOTE — Progress Notes (Signed)
Subjective:  Pt seen and examined in AM.  No acute events overnight. Pt reports still with pink tinged sputum cough at times. No chest pain, dyspnea, or lightheadedness. He reports he slept well last night with benadryl.   Objective: Vital signs in last 24 hours: Filed Vitals:   05/26/13 1300 05/26/13 1602 05/26/13 2123 05/27/13 0338  BP: 133/77 128/72 122/78 120/78  Pulse: 94 99 70 82  Temp: 98.5 F (36.9 C) 97.9 F (36.6 C) 98.2 F (36.8 C) 98.7 F (37.1 C)  TempSrc: Oral Oral Oral Oral  Resp: 18 18 18 18   Height:      Weight:    69.718 kg (153 lb 11.2 oz)  SpO2: 98% 99% 98% 95%   Weight change: 1.452 kg (3 lb 3.2 oz)  Intake/Output Summary (Last 24 hours) at 05/27/13 1058 Last data filed at 05/27/13 0958  Gross per 24 hour  Intake    480 ml  Output   2125 ml  Net  -1645 ml   Vitals reviewed. General: resting in bed, NAD, on [place] 2L O2 HEENT: PERRL, EOMI Cardiac: normal rate, irregularly irregular Pulm:  Scattered rhonchi and diffuse expiratory wheezing Abd: soft, nontender, nondistended, BS present Ext: warm and well perfused, no pedal edema Neuro: alert and oriented X3  Lab Results: Basic Metabolic Panel:  Recent Labs Lab 05/25/13 0825 05/26/13 0404 05/27/13 0415  NA 135* 145 141  K 4.2 4.6 5.3  CL 96 107 104  CO2 23 27 26   GLUCOSE 166* 114* 95  BUN 23 25* 21  CREATININE 1.48* 1.71* 1.62*  CALCIUM 8.5 8.6 8.6  MG 1.6 1.8  --    CBC:  Recent Labs Lab 05/24/13 0555 05/27/13 0810  WBC 11.6* 5.9  HGB 12.8* 13.4  HCT 38.3* 39.7  MCV 91.6 91.1  PLT 175 177   Cardiac Enzymes:  Recent Labs Lab 05/23/13 0800 05/25/13 0140  TROPONINI <0.30 <0.30   BNP:  Recent Labs Lab 05/22/13 1920  PROBNP 9662.0*   CBG:  Recent Labs Lab 05/25/13 1624  GLUCAP 96   Coagulation:  Recent Labs Lab 05/24/13 0555 05/25/13 0322 05/26/13 0404 05/27/13 0415  LABPROT 27.1* 19.7* 18.5* 27.5*  INR 2.62* 1.72* 1.59* 2.67*   Urine Drug  Screen: Drugs of Abuse     Component Value Date/Time   LABOPIA POSITIVE* 10/12/2012 1724   LABOPIA NEGATIVE 12/27/2008 1943   COCAINSCRNUR NONE DETECTED 10/12/2012 1724   COCAINSCRNUR NEG 07/06/2009 2225   LABBENZ NONE DETECTED 10/12/2012 1724   LABBENZ NEG 07/06/2009 2225   AMPHETMU NONE DETECTED 10/12/2012 1724   AMPHETMU NEG 07/06/2009 2225   THCU POSITIVE* 10/12/2012 1724   LABBARB NONE DETECTED 10/12/2012 1724    Studies/Results: No results found. Medications: I have reviewed the patient's current medications. Scheduled Meds: . buPROPion  75 mg Oral BID  . diltiazem  360 mg Oral Daily  . doxycycline  100 mg Oral Q12H  . dronabinol  2.5 mg Oral BID AC  . dronedarone  400 mg Oral BID WC  . feeding supplement (ENSURE COMPLETE)  237 mL Oral BID BM  . hydrocortisone cream   Topical BID  .  HYDROmorphone (DILAUDID) injection  0.5 mg Intravenous Once  . lisinopril  5 mg Oral Daily  . metoprolol succinate  50 mg Oral Daily  . nicotine  7 mg Transdermal Daily  . pantoprazole  40 mg Oral Daily  . sodium chloride  3 mL Intravenous Q12H  . tiotropium  18 mcg  Inhalation Daily  . Warfarin - Pharmacist Dosing Inpatient   Does not apply q1800   Continuous Infusions: . sodium chloride 20 mL/hr (05/27/13 0824)  . diltiazem (CARDIZEM) infusion 10 mg/hr (05/25/13 1320)   PRN Meds:.diphenhydrAMINE, ipratropium, levalbuterol, oxyCODONE, oxyCODONE-acetaminophen Assessment/Plan: Principal Problem:   COPD exacerbation Active Problems:   TOBACCO ABUSE   HYPERTENSION   PAF (paroxysmal atrial fibrillation) on chronic coumadin   BRADYCARDIA-TACHYCARDIA SYNDROME   COPD (chronic obstructive pulmonary disease)   G E R D   OSTEOARTHRITIS   HYPERTHYROIDISM   Chronic kidney disease   Chronic combined systolic and diastolic congestive heart failure   Non-ischemic cardiomyopathy   Atrial fibrillation  Assessment: [name] is a 68 year old male with PMH of Atrial fibrillation (on coumadin with  sub-therapeutic INR), HTN, CKD 3, COPD, tachy-brady syndrome (with pacemaker), non-ischemic cardiomyopathy, and diastolic HF (EF 36-14% Dec 2014) admitted for atrial flutter/fib RVR.   Plan:   Atrial flutter in setting of tachy-brady syndrome--currently in atrial fibrillation, HR 99. Most likely due to decrease in metoprolol from 50 to 25 mg daily and re-setting of pacemaker on last admission. TSH wnl. -Continue metoprolol succinate 50 mg daily  -Continue dronedarone 400 mg BID  -Continue PO diltiazem 360 mg daily  -Appreciate cardiology recommendations --> TEE cardioversion today---> did not convert, pacemaker interrogated and working normally -Continue coumadin per pharmacy and monitoring INR, therapeutic today 2.67 -Continue to monitor on telemetry  Hemoptysis - Pt reports pink to dark tinged sputum x 2 day, possibly due to bronchitis vs AC therapy  (however subtherapeutic levels at time of onset and before heparin was initiated)  -Obtain CBC --> stable  -Continue to monitor in setting of coumadin, currently with therapeutic INR    COPD Mild Exacerbation - still with wheezing. Pt with chronic cough and dyspnea with exertion s/p 5 day azithromycin and prednisone course.  HCAP unlikely giving chronicity of CXR and relatively unchanged respiratory symptoms from previous admission. Pt with +urine strep pneumoniae and -urine legionella Ag. - Awaiting sputum culture  - Continue doxycycline 100 mg po BID Day 5/5 - Oxygen therapy to keep SpO2 >92% - Breathing treatment Q6 hr  - Continue home Spiriva daily    Combined Systolic and Diastolic CHF - Pt with 2D-echo on 12/30 with improved systolic function EF 43-15%, no wall motion, and inability to assess diastolic function. Pt without signs of volume overload on exam or CXR.  -Continue metoprolol succinate 50 mg daily   -Monitor daily weight (155 to 150) and I & O's (2.2L out yesterday)  Chronic Kidney Disease Stage 3 -  Uptrending. Pt with Cr  1.48 to 1.71, still below baseline Cr 2.0 -Avoid nephrotoxins -Continue to monitor renal function  Hypertension - currently normotensive  -Continue home lisinopril 5 mg daily  -Continue 50 mg metoprolol succinate   -Continue PO diltiazem 360 mg daily   Severe Malnutrition - Pt with BMI of 17.36 indicating underweight and meeting severe malnutrition criteria (14% weight loss in 4 months). Pt reported unintentional 50 lb weight loss in last year with work-up to date negative  -Continue dronabinol 2.5 mg BID 4-week trial  -Nutrition supplements  Leukocytosis - Most likely due to recent corticosteroid course. Pt also with +Strep Pneum urine antigen -Continue to monitor CBC  Insomnia - improved.  -Continue  benadryl capsule  PRN   Rash - Pt with pruritic right lower abdominal quadrant and thoracic area for 2 months of unknown etiology. -Apply hydrocortisone 0.5% cream BID  Tobacco Abuse -Continue bupropion 75 mg BID  Diet: Regular DVT Ppx: Coumadin Code: Full   Dispo: Disposition is deferred at this time, awaiting improvement of current medical problems.  Anticipated discharge in approximately 2-3 day(s).   The patient does have a current PCP Axel Filler, MD) and does need an [facility] hospital follow-up appointment after discharge.  The patient does not have transportation limitations that hinder transportation to clinic appointments.  Services Needed at time of discharge: Y = Yes, Blank = No PT:   OT:   RN:   Equipment:   Other:     LOS: 5 days   Juluis Mire, MD 05/27/2013, 10:58 AM

## 2013-05-27 NOTE — Progress Notes (Addendum)
ANTICOAGULATION CONSULT NOTE - Follow Up Consult  Pharmacy Consult for Warfarin  Indication: atrial fibrillation  Allergies  Allergen Reactions  . Digoxin And Related     Dig toxicity 2009  . Penicillins Rash   Patient Measurements: Height: 6\' 3"  (190.5 cm) Weight: 153 lb 11.2 oz (69.718 kg) IBW/kg (Calculated) : 84.5  Vital Signs: Temp: 98 F (36.7 C) (01/08 1210) Temp src: Oral (01/08 1210) BP: 129/105 mmHg (01/08 1210) Pulse Rate: 112 (01/08 1210)  Labs:  Recent Labs  05/25/13 0140 05/25/13 0322 05/25/13 0825 05/26/13 0404 05/26/13 1705 05/27/13 0415 05/27/13 0810  HGB  --   --   --   --   --   --  13.4  HCT  --   --   --   --   --   --  39.7  PLT  --   --   --   --   --   --  177  LABPROT  --  19.7*  --  18.5*  --  27.5*  --   INR  --  1.72*  --  1.59*  --  2.67*  --   HEPARINUNFRC  --   --   --   --  0.18* 0.43  --   CREATININE  --   --  1.48* 1.71*  --  1.62*  --   TROPONINI <0.30  --   --   --   --   --   --     Estimated Creatinine Clearance: 42.4 ml/min (by C-G formula based on Cr of 1.62).   Assessment: 68 y/o M on warfarin for afib. INR therapeutic at 2.67 today but big trend up. Will give smaller dose today but will not hold coumadin since it dropped INR significantly last time. Plan for TEE guided cardioversion today  Goal of Therapy:  INR 2-3  Monitor platelets by anticoagulation protocol: Yes   Plan:  Warfarin 5 mg x 1 dose today  F/u daily INR  Monitor for s/s of bleeding  Albertina Parr, PharmD.  Clinical Pharmacist Pager [phone]

## 2013-05-28 ENCOUNTER — Encounter (HOSPITAL_COMMUNITY): Payer: Self-pay | Admitting: Cardiology

## 2013-05-28 LAB — BASIC METABOLIC PANEL
BUN: 24 mg/dL — ABNORMAL HIGH (ref 6–23)
CALCIUM: 8.8 mg/dL (ref 8.4–10.5)
CO2: 24 mEq/L (ref 19–32)
CREATININE: 1.49 mg/dL — AB (ref 0.50–1.35)
Chloride: 105 mEq/L (ref 96–112)
GFR, EST AFRICAN AMERICAN: 53 mL/min — AB (ref >90)
GFR, EST NON AFRICAN AMERICAN: 46 mL/min — AB (ref >90)
Glucose, Bld: 138 mg/dL — ABNORMAL HIGH (ref 70–99)
Potassium: 5.3 mEq/L (ref 3.7–5.3)
Sodium: 140 mEq/L (ref 137–147)

## 2013-05-28 LAB — CBC
HCT: 42.1 % (ref 39.0–52.0)
Hemoglobin: 14.2 g/dL (ref 13.0–17.0)
MCH: 30.7 pg (ref 26.0–34.0)
MCHC: 33.7 g/dL (ref 30.0–36.0)
MCV: 91.1 fL (ref 78.0–100.0)
PLATELETS: 202 10*3/uL (ref 150–400)
RBC: 4.62 MIL/uL (ref 4.22–5.81)
RDW: 15.2 % (ref 11.5–15.5)
WBC: 3.6 10*3/uL — ABNORMAL LOW (ref 4.0–10.5)

## 2013-05-28 LAB — PROTIME-INR
INR: 3 — ABNORMAL HIGH (ref 0.00–1.49)
PROTHROMBIN TIME: 30.1 s — AB (ref 11.6–15.2)

## 2013-05-28 LAB — MAGNESIUM: MAGNESIUM: 1.8 mg/dL (ref 1.5–2.5)

## 2013-05-28 LAB — GLUCOSE, CAPILLARY
GLUCOSE-CAPILLARY: 157 mg/dL — AB (ref 70–99)
Glucose-Capillary: 117 mg/dL — ABNORMAL HIGH (ref 70–99)
Glucose-Capillary: 124 mg/dL — ABNORMAL HIGH (ref 70–99)
Glucose-Capillary: 158 mg/dL — ABNORMAL HIGH (ref 70–99)

## 2013-05-28 MED ORDER — WARFARIN SODIUM 5 MG PO TABS
5.0000 mg | ORAL_TABLET | Freq: Once | ORAL | Status: AC
  Administered 2013-05-28: 5 mg via ORAL
  Filled 2013-05-28: qty 1

## 2013-05-28 MED ORDER — METOPROLOL SUCCINATE ER 100 MG PO TB24
100.0000 mg | ORAL_TABLET | Freq: Every day | ORAL | Status: DC
  Administered 2013-05-28 – 2013-05-30 (×3): 100 mg via ORAL
  Filled 2013-05-28 (×3): qty 1

## 2013-05-28 NOTE — Progress Notes (Signed)
ANTICOAGULATION CONSULT NOTE - Follow Up Consult  Pharmacy Consult for Warfarin  Indication: atrial fibrillation  Allergies  Allergen Reactions  . Digoxin And Related     Dig toxicity 2009  . Penicillins Rash   Patient Measurements: Height: 6\' 3"  (190.5 cm) Weight: 153 lb (69.4 kg) IBW/kg (Calculated) : 84.5  Vital Signs: Temp: 97.8 F (36.6 C) (01/09 0402) Temp src: Oral (01/09 0402) BP: 136/94 mmHg (01/09 0402) Pulse Rate: 88 (01/09 0402)  Labs:  Recent Labs  05/26/13 0404 05/26/13 1705 05/27/13 0415  05/27/13 0810 05/27/13 1605 05/28/13 0518 05/28/13 0556  HGB  --   --   --   < > 13.4 14.4  --  14.2  HCT  --   --   --   --  39.7 43.4  --  42.1  PLT  --   --   --   --  177 187  --  202  LABPROT 18.5*  --  27.5*  --   --   --   --  30.1*  INR 1.59*  --  2.67*  --   --   --   --  3.00*  HEPARINUNFRC  --  0.18* 0.43  --   --   --   --   --   CREATININE 1.71*  --  1.62*  --   --   --  1.49*  --   < > = values in this interval not displayed.  Estimated Creatinine Clearance: 45.9 ml/min (by C-G formula based on Cr of 1.49).   Assessment: 68 y/o M on warfarin for afib. INR therapeutic at 3. Home dose 12.5 mg daily but admission INR low with hx of noncompliance.  INR up after 3 doses of 15 mg and 1 dose of 5 mg yesterday. Multaq 400 mg po BID stopped today which may lead to increased coumadin requirements. S/p failed DCCV yesterday. Doxycycline stopped today.   Goal of Therapy:  INR 2-3   Plan:  Repeat Warfarin 5 mg x 1 dose today  F/u daily INR  Monitor for s/s of bleeding Eudelia Bunch, Pharm.D. [phone] 05/28/2013 10:54 AM

## 2013-05-28 NOTE — Progress Notes (Addendum)
Subjective:  Pt seen and examined in AM.  No acute events overnight. Tolerated procedure well yesterday with improved anxiety. No chest pain. Reports still coughing up small amounts of blood. No fever, chills, nausea, vomiting, abdominal pain, change in BM or urination. Appetite is good and ambulating without lightheadedness.   Objective: Vital signs in last 24 hours: Filed Vitals:   05/27/13 1435 05/27/13 1440 05/27/13 1946 05/28/13 0402  BP: 134/100  117/84 136/94  Pulse: 107 110 115 88  Temp:   98.2 F (36.8 C) 97.8 F (36.6 C)  TempSrc:   Oral Oral  Resp: 20 19 18 18   Height:      Weight:    69.4 kg (153 lb)  SpO2: 100% 100% 99% 100%   Weight change: -0.318 kg (-11.2 oz)  Intake/Output Summary (Last 24 hours) at 05/28/13 1009 Last data filed at 05/28/13 0353  Gross per 24 hour  Intake    700 ml  Output   1075 ml  Net   -375 ml   Vitals reviewed. General: resting in bed, NAD, on [place] 2L O2 HEENT: PERRL, EOMI Cardiac: normal rate, irregularly irregular Pulm:  Scattered rhonchi and diffuse expiratory wheezing Abd: soft, nontender, nondistended, BS present Ext: warm and well perfused, no pedal edema Neuro: alert and oriented X3  Lab Results: Basic Metabolic Panel:  Recent Labs Lab 05/26/13 0404 05/27/13 0415 05/28/13 0518  NA 145 141 140  K 4.6 5.3 5.3  CL 107 104 105  CO2 27 26 24   GLUCOSE 114* 95 138*  BUN 25* 21 24*  CREATININE 1.71* 1.62* 1.49*  CALCIUM 8.6 8.6 8.8  MG 1.8  --  1.8   CBC:  Recent Labs Lab 05/27/13 1605 05/28/13 0556  WBC 8.2 3.6*  HGB 14.4 14.2  HCT 43.4 42.1  MCV 93.1 91.1  PLT 187 202   Cardiac Enzymes:  Recent Labs Lab 05/23/13 0800 05/25/13 0140  TROPONINI <0.30 <0.30   BNP:  Recent Labs Lab 05/22/13 1920  PROBNP 9662.0*   CBG:  Recent Labs Lab 05/25/13 1624 05/27/13 2112 05/28/13 0554  GLUCAP 96 84 117*   Coagulation:  Recent Labs Lab 05/25/13 0322 05/26/13 0404 05/27/13 0415 05/28/13 0556    LABPROT 19.7* 18.5* 27.5* 30.1*  INR 1.72* 1.59* 2.67* 3.00*   Urine Drug Screen: Drugs of Abuse     Component Value Date/Time   LABOPIA POSITIVE* 10/12/2012 1724   LABOPIA NEGATIVE 12/27/2008 1943   COCAINSCRNUR NONE DETECTED 10/12/2012 1724   COCAINSCRNUR NEG 07/06/2009 2225   LABBENZ NONE DETECTED 10/12/2012 1724   LABBENZ NEG 07/06/2009 2225   AMPHETMU NONE DETECTED 10/12/2012 1724   AMPHETMU NEG 07/06/2009 2225   THCU POSITIVE* 10/12/2012 1724   LABBARB NONE DETECTED 10/12/2012 1724    Studies/Results: Dg Chest 2 View  05/27/2013   CLINICAL DATA:  Shortness of breath  EXAM: CHEST  2 VIEW  COMPARISON:  DG CHEST 2 VIEW dated 05/22/2013; DG CHEST 2 VIEW dated 05/17/2013; CT ABD/PELV WO CM dated 10/12/2012  FINDINGS: There is mild right basilar pleural thickening. There is elevation of the left diaphragm. There is increased opacity in the left retrocardiac region concerning for pneumonia. There is chronic scarring involving the left mid lung. Stable cardiomediastinal silhouette. There is a dual lead cardiac pacer. The osseous structures are unremarkable.  IMPRESSION: Increased airspace opacity in the left retrocardiac region concerning for a left lower lobe pneumonia. Recommend followup radiography in 4-6 weeks, to document complete resolution following adequate medical  therapy. If there is not complete resolution, then recommend further evaluation with CT of the chest to exclude underlying pathology.   Electronically Signed   By: Kathreen Devoid   On: 05/27/2013 19:44   Medications: I have reviewed the patient's current medications. Scheduled Meds: . buPROPion  75 mg Oral BID  . diltiazem  360 mg Oral Daily  . dronabinol  2.5 mg Oral BID AC  . feeding supplement (ENSURE COMPLETE)  237 mL Oral BID BM  . hydrocortisone cream   Topical BID  . lisinopril  5 mg Oral Daily  . metoprolol succinate  100 mg Oral Daily  . nicotine  7 mg Transdermal Daily  . pantoprazole  40 mg Oral Daily  . predniSONE  60  mg Oral Daily  . sodium chloride  3 mL Intravenous Q12H  . tiotropium  18 mcg Inhalation Daily  . Warfarin - Pharmacist Dosing Inpatient   Does not apply q1800   Continuous Infusions: . sodium chloride 20 mL/hr (05/27/13 0824)   PRN Meds:.diphenhydrAMINE, ipratropium, levalbuterol, oxyCODONE, oxyCODONE-acetaminophen Assessment/Plan: Principal Problem:   COPD exacerbation Active Problems:   TOBACCO ABUSE   HYPERTENSION   PAF (paroxysmal atrial fibrillation) on chronic coumadin   BRADYCARDIA-TACHYCARDIA SYNDROME   COPD (chronic obstructive pulmonary disease)   G E R D   OSTEOARTHRITIS   HYPERTHYROIDISM   Chronic kidney disease   Chronic combined systolic and diastolic congestive heart failure   Non-ischemic cardiomyopathy   Atrial fibrillation  Assessment: Mr. Schumpert is a 68 year old male with PMH of Atrial fibrillation (on coumadin with sub-therapeutic INR), HTN, CKD 3, COPD, tachy-brady syndrome (with pacemaker), non-ischemic cardiomyopathy, and diastolic HF (EF 32-99% Dec 2014) admitted for atrial flutter/fib RVR.   Plan:   Atrial flutter in setting of tachy-brady syndrome with failed cardio conversion on 1/8--currently in atrial fibrillation with normal HR . Most likely triggered by decrease in metoprolol from 50 to 25 mg daily and re-setting of pacemaker on last admission.  -TEE on 1/8 with failed cardioversion,  pacemaker interrogated and working normally -Per cardiology recommendations ---> increase metoprolol succinate from 50 mg to 100 mg daily  -Continue PO diltiazem 360 mg daily  -Per cardiology recommendations --> disontinue dronedarone 400 mg BID  -Continue coumadin per pharmacy and monitoring INR, therapeutic today 3.00 -Continue to monitor on telemetry -To follow-up with Dr. Rayann Heman in 1-2 weeks  COPD Moderate Exacerbation - not improving Pt is s/p 5-day azithromycin, 5-day doxycycline, and 5 day prednisone course. Pt initially received IV van and zosyn for HCAP.   Pt with pos urine strep pneumoniae however no fever or leukocytosis but CXR findings concerning for LL PNA.  -Obtain Chest Xray 05/27/12 --> Increased airspace opacity in the left retrocardiac region, concerning for a left lower lobe pneumonia. Recommend followup radiography in 4-6 weeks, if no resolution consider CT  -Due to CXR changes and positive strep pneum, consider IV ceftriaxone - Awaiting sputum culture - Oxygen therapy to keep SpO2 >92% - Breathing treatment Q6 hr  - Continue home Spiriva daily   -Day 2/3 of prednisone 60 mg   Hemoptysis - Pt reports pink to dark tinged sputum x 3 day, possibly due to PNA vs bronchitis vs AC therapy (currently therapeutic levels)  -Obtain HIV Ab -Obtain CBC --> stable  -Continue to monitor in setting of coumadin, currently with therapeutic INR    Combined Systolic and Diastolic CHF - Pt with 2D-echo on 12/30 with improved systolic function EF 24-26%, no wall motion, and  inability to assess diastolic function. Pt without signs of volume overload on exam or CXR.  -Per cardiology recommendations, increase metoprolol succinate 50 mg to 100 mg daily   -Monitor daily weight (153 to 153) and I & O's (1.6L out yesterday)  Chronic Kidney Disease Stage 3 -  Cr 1.48  below baseline Cr 2.0 -Avoid nephrotoxins -Continue to monitor renal function  Hypertension - currently hypertensive  -Continue home lisinopril 5 mg daily  -Continue 50 mg metoprolol succinate   -Continue PO diltiazem 360 mg daily   Severe Malnutrition - Pt with BMI of 17.36 indicating underweight and meeting severe malnutrition criteria (14% weight loss in 4 months). Pt reported unintentional 50 lb weight loss in last year with work-up to date negative  -Continue dronabinol 2.5 mg BID 4-week trial (pt states he could not get this prescription filled) -Nutrition supplements  Insomnia - improved  -Continue  benadryl capsule  PRN   Rash - Pt with pruritic right lower abdominal quadrant and  thoracic area for 2 months of unknown etiology. -Apply hydrocortisone 0.5% cream BID    Tobacco Abuse -Continue bupropion 75 mg BID  Diet: Regular DVT Ppx: Coumadin Code: Full   Dispo: Disposition is deferred at this time, awaiting improvement of current medical problems.  Anticipated discharge in approximately 1-2 day(s).   The patient does have a current PCP Axel Filler, MD) and does need an [facility] hospital follow-up appointment after discharge.  The patient does not have transportation limitations that hinder transportation to clinic appointments.  Services Needed at time of discharge: Y = Yes, Blank = No PT:   OT:   RN:   Equipment:   Other:     LOS: 6 days   Juluis Mire, MD 05/28/2013, 10:09 AM

## 2013-05-28 NOTE — Progress Notes (Addendum)
SUBJECTIVE: The patient is doing well today.  At this time, he denies chest pain, shortness of breath, or any new concerns.  He states that he is doing better this morning.   Ventricular rates controlled - failed DCCV yesterday.   INR 3.00 today     CURRENT MEDICATIONS: . buPROPion  75 mg Oral BID  . diltiazem  360 mg Oral Daily  . doxycycline  100 mg Oral Q12H  . dronabinol  2.5 mg Oral BID AC  . dronedarone  400 mg Oral BID WC  . feeding supplement (ENSURE COMPLETE)  237 mL Oral BID BM  . hydrocortisone cream   Topical BID  . lisinopril  5 mg Oral Daily  . metoprolol succinate  50 mg Oral Daily  . nicotine  7 mg Transdermal Daily  . pantoprazole  40 mg Oral Daily  . predniSONE  60 mg Oral Daily  . sodium chloride  3 mL Intravenous Q12H  . tiotropium  18 mcg Inhalation Daily  . Warfarin - Pharmacist Dosing Inpatient   Does not apply q1800   . sodium chloride 20 mL/hr (05/27/13 0824)  . diltiazem (CARDIZEM) infusion 10 mg/hr (05/25/13 1320)    OBJECTIVE: Physical Exam: Filed Vitals:   05/27/13 1435 05/27/13 1440 05/27/13 1946 05/28/13 0402  BP: 134/100  117/84 136/94  Pulse: 107 110 115 88  Temp:   98.2 F (36.8 C) 97.8 F (36.6 C)  TempSrc:   Oral Oral  Resp: 20 19 18 18   Height:      Weight:    153 lb (69.4 kg)  SpO2: 100% 100% 99% 100%    Intake/Output Summary (Last 24 hours) at 05/28/13 0748 Last data filed at 05/28/13 0353  Gross per 24 hour  Intake    700 ml  Output   1575 ml  Net   -875 ml    Telemetry reveals atrial fibrillation, ventricular rates 70-100  GEN- The patient is well appearing, alert and oriented x 3 today.   Head- normocephalic, atraumatic Eyes-  Sclera clear, conjunctiva pink Ears- hearing intact Oropharynx- clear Neck- supple, no JVP Lymph- no cervical lymphadenopathy Lungs- Clear to ausculation bilaterally, normal work of breathing Heart- irregular rate and rhythm, no murmurs, rubs or gallops, PMI not laterally displaced GI-  soft, NT, ND, + BS Extremities- no clubbing, cyanosis, or edema   LABS: Basic Metabolic Panel:  Recent Labs  05/26/13 0404 05/27/13 0415 05/28/13 0518  NA 145 141 140  K 4.6 5.3 5.3  CL 107 104 105  CO2 27 26 24   GLUCOSE 114* 95 138*  BUN 25* 21 24*  CREATININE 1.71* 1.62* 1.49*  CALCIUM 8.6 8.6 8.8  MG 1.8  --  1.8    RADIOLOGY: Dg Chest 2 View 05/22/2013   CLINICAL DATA:  Chest pain with shortness of breath. History of pacemaker and congestive heart failure.  EXAM: CHEST  2 VIEW  COMPARISON:  05/17/2013 radiographs.  CT 10/12/2012.  FINDINGS: The right subclavian pacemaker leads appear unchanged within the right ventricle. The heart size and mediastinal contours are stable. There is chronic volume loss in the left hemithorax with parenchymal scarring, pleural thickening and pleural calcification. Compared with the prior studies, there is increased density inferiorly suspicious for superimposed airspace disease. The right lung is clear.  IMPRESSION: Increased density in the lower left hemithorax compared with prior studies, suspicious for pneumonia superimposed on chronic pleural parenchymal scarring. Radiographic follow up recommended.   Electronically Signed   By: Camie Patience  M.D.   On: 05/22/2013 20:46   ASSESSMENT AND PLAN:  Principal Problem:   COPD exacerbation Active Problems:   TOBACCO ABUSE   HYPERTENSION   PAF (paroxysmal atrial fibrillation) on chronic coumadin   BRADYCARDIA-TACHYCARDIA SYNDROME   COPD (chronic obstructive pulmonary disease)   G E R D   OSTEOARTHRITIS   HYPERTHYROIDISM   Chronic kidney disease   Chronic combined systolic and diastolic congestive heart failure   Non-ischemic cardiomyopathy   Atrial fibrillation  1. Afib/ atrial flutter  He reports symptoms of fatigue but is mostly tolerant of his atrial arrhythmias.   Rate controlled with oral diltiazem.  Increase toprol to 100mg  daily. INR is now therapeutic.  Stop multaq today. Will plan on  a rate control strategy going forward as he really does not have good antiarrhythmic options  2. Chronic systolic dysfunction  EF is improved  Continue rate control   3. Prior DVT/PE  Continue long term anticoagulation  As above  OK to discharge today from an EP standpoint. I will have him seen by my PA in the office in 1-2 weeks. Please call with questions

## 2013-05-28 NOTE — Progress Notes (Signed)
  Date: 05/28/2013  Patient name: Travis SANE Sr.  Medical record number: [id number]  Date of birth: [birth year]   This patient has been seen and the plan of care was discussed with the house staff. Please see their note for complete details. I concur with their findings with the following additions/corrections:  [name] is doing better today, respiratory symptoms improved.  Please note correction, his metoprolol has been increased to 100mg  BID for rate control.  From a Cardiology standpoint Dr. Rayann Heman reports that [name] is ready to be discharged.  From a respiratory standpoint, he will complete one more day of steroids for bronchitis.    Sid Falcon, MD 05/28/2013, 4:33 PM

## 2013-05-29 LAB — GLUCOSE, CAPILLARY
GLUCOSE-CAPILLARY: 107 mg/dL — AB (ref 70–99)
GLUCOSE-CAPILLARY: 96 mg/dL (ref 70–99)
GLUCOSE-CAPILLARY: 132 mg/dL — AB (ref 70–99)
Glucose-Capillary: 136 mg/dL — ABNORMAL HIGH (ref 70–99)

## 2013-05-29 LAB — PROTIME-INR
INR: 3.67 — ABNORMAL HIGH (ref 0.00–1.49)
PROTHROMBIN TIME: 35.1 s — AB (ref 11.6–15.2)

## 2013-05-29 LAB — HIV ANTIBODY (ROUTINE TESTING W REFLEX): HIV: NONREACTIVE

## 2013-05-29 NOTE — Progress Notes (Addendum)
Subjective:  Pt seen and examined in AM. No acute events overnight. Pt reports he feels great. His breathing, wheezing, and cough have improved and he has had no further episodes of hemoptysis. He denies chest pain or palpitationsHe denies fever, chills, nausea, vomiting, abdominal pain, or change in BM or urination. His appetite has improved. He is able to ambulate without lightheadedness. Pt states he wants to stay 1 more day until tomorrow due to change in his medications.    Objective: Vital signs in last 24 hours: Filed Vitals:   05/28/13 1431 05/28/13 1938 05/29/13 0359 05/29/13 0500  BP: 132/94 126/78 128/86   Pulse:  99 82   Temp:  97.5 F (36.4 C) 97.9 F (36.6 C)   TempSrc:  Oral Oral   Resp:  18 18   Height:      Weight:    70.2 kg (154 lb 12.2 oz)  SpO2:  97% 100%    Weight change: 0.8 kg (1 lb 12.2 oz)  Intake/Output Summary (Last 24 hours) at 05/29/13 0732 Last data filed at 05/29/13 0500  Gross per 24 hour  Intake    600 ml  Output   1800 ml  Net  -1200 ml   Vitals reviewed. General: resting in bed, NAD, on [place] 2L O2 HEENT: PERRL, EOMI Cardiac: normal rate, irregularly irregular Pulm:  Much improved, mild expiratory wheezing Abd: soft, nontender, nondistended, BS present Ext: warm and well perfused, no pedal edema Neuro: alert and oriented X3  Lab Results: Basic Metabolic Panel:  Recent Labs Lab 05/26/13 0404 05/27/13 0415 05/28/13 0518  NA 145 141 140  K 4.6 5.3 5.3  CL 107 104 105  CO2 27 26 24   GLUCOSE 114* 95 138*  BUN 25* 21 24*  CREATININE 1.71* 1.62* 1.49*  CALCIUM 8.6 8.6 8.8  MG 1.8  --  1.8   CBC:  Recent Labs Lab 05/27/13 1605 05/28/13 0556  WBC 8.2 3.6*  HGB 14.4 14.2  HCT 43.4 42.1  MCV 93.1 91.1  PLT 187 202   Cardiac Enzymes:  Recent Labs Lab 05/23/13 0800 05/25/13 0140  TROPONINI <0.30 <0.30   BNP:  Recent Labs Lab 05/22/13 1920  PROBNP 9662.0*   CBG:  Recent Labs Lab 05/25/13 1624  05/27/13 2112 05/28/13 0554 05/28/13 1121 05/28/13 1701 05/28/13 2117  GLUCAP 96 84 117* 124* 157* 158*   Coagulation:  Recent Labs Lab 05/26/13 0404 05/27/13 0415 05/28/13 0556 05/29/13 0259  LABPROT 18.5* 27.5* 30.1* 35.1*  INR 1.59* 2.67* 3.00* 3.67*   Urine Drug Screen: Drugs of Abuse     Component Value Date/Time   LABOPIA POSITIVE* 10/12/2012 1724   LABOPIA NEGATIVE 12/27/2008 1943   COCAINSCRNUR NONE DETECTED 10/12/2012 1724   COCAINSCRNUR NEG 07/06/2009 2225   LABBENZ NONE DETECTED 10/12/2012 1724   LABBENZ NEG 07/06/2009 2225   AMPHETMU NONE DETECTED 10/12/2012 1724   AMPHETMU NEG 07/06/2009 2225   THCU POSITIVE* 10/12/2012 1724   LABBARB NONE DETECTED 10/12/2012 1724    Studies/Results: Dg Chest 2 View  05/27/2013   CLINICAL DATA:  Shortness of breath  EXAM: CHEST  2 VIEW  COMPARISON:  DG CHEST 2 VIEW dated 05/22/2013; DG CHEST 2 VIEW dated 05/17/2013; CT ABD/PELV WO CM dated 10/12/2012  FINDINGS: There is mild right basilar pleural thickening. There is elevation of the left diaphragm. There is increased opacity in the left retrocardiac region concerning for pneumonia. There is chronic scarring involving the left mid lung. Stable cardiomediastinal silhouette.  There is a dual lead cardiac pacer. The osseous structures are unremarkable.  IMPRESSION: Increased airspace opacity in the left retrocardiac region concerning for a left lower lobe pneumonia. Recommend followup radiography in 4-6 weeks, to document complete resolution following adequate medical therapy. If there is not complete resolution, then recommend further evaluation with CT of the chest to exclude underlying pathology.   Electronically Signed   By: Kathreen Devoid   On: 05/27/2013 19:44   Medications: I have reviewed the patient's current medications. Scheduled Meds: . buPROPion  75 mg Oral BID  . diltiazem  360 mg Oral Daily  . dronabinol  2.5 mg Oral BID AC  . feeding supplement (ENSURE COMPLETE)  237 mL Oral BID BM   . hydrocortisone cream   Topical BID  . lisinopril  5 mg Oral Daily  . metoprolol succinate  100 mg Oral Daily  . nicotine  7 mg Transdermal Daily  . pantoprazole  40 mg Oral Daily  . predniSONE  60 mg Oral Daily  . sodium chloride  3 mL Intravenous Q12H  . tiotropium  18 mcg Inhalation Daily  . Warfarin - Pharmacist Dosing Inpatient   Does not apply q1800   Continuous Infusions: . sodium chloride 20 mL/hr (05/27/13 0824)   PRN Meds:.diphenhydrAMINE, ipratropium, levalbuterol, oxyCODONE, oxyCODONE-acetaminophen Assessment/Plan: Principal Problem:   COPD exacerbation Active Problems:   TOBACCO ABUSE   HYPERTENSION   PAF (paroxysmal atrial fibrillation) on chronic coumadin   BRADYCARDIA-TACHYCARDIA SYNDROME   COPD (chronic obstructive pulmonary disease)   G E R D   OSTEOARTHRITIS   HYPERTHYROIDISM   Chronic kidney disease   Chronic combined systolic and diastolic congestive heart failure   Non-ischemic cardiomyopathy   Atrial fibrillation  Assessment: [name] is a 68 year old male with PMH of Atrial fibrillation (on coumadin with sub-therapeutic INR), HTN, CKD 3, COPD, tachy-brady syndrome (with pacemaker), non-ischemic cardiomyopathy, and diastolic HF (EF 53-66% Dec 2014) admitted for atrial flutter/fib RVR.   Plan:   Atrial flutter in setting of tachy-brady syndrome with failed cardio conversion on 1/8--currently in atrial fibrillation with normal HR .  -Continue metoprolol succinate 100 mg daily  -Continue PO diltiazem 360 mg daily  -Cer cardiology recommendations disontinued dronedarone 400 mg BID  -Continue coumadin per pharmacy and monitoring INR, supra-therapeutic 3.67  -Continue to monitor on telemetry -To follow-up with Dr. Rayann Heman in 1-2 weeks  COPD Moderate Exacerbation - improved wheezing, 100% on RA -Chest Xray 05/27/12 --> Recommend followup radiography in 4-6 weeks - Awaiting sputum culture -Oxygen therapy to keep SpO2 >92% -Breathing treatment Q6 hr   -Continue home Spiriva daily   -Continue prednisone 60 mg Day 3/3  Hemoptysis - no further episodes.  Pt reports pink to dark tinged sputum x 3 day, possibly due to PNA vs bronchitis vs AC therapy (currently therapeutic levels). CBC stable.  -Obtain HIV Ab -Continue to monitor in setting of coumadin, currently with therapeutic INR    Combined Systolic and Diastolic CHF - Pt with 2D-echo on 12/30 with improved systolic function EF 44-03%, no wall motion, and inability to assess diastolic function. Pt without signs of volume overload on exam or CXR.  -Per cardiology recommendations, continue metoprolol succinate 100 mg daily   -Monitor daily weight (153 to 154) and I & O's (1.8L out yesterday)  Chronic Kidney Disease Stage 3 -  Last Cr 1.49 on 1/9, below baseline Cr 2.0 -Avoid nephrotoxins -Continue to monitor renal function  Hypertension - currently normotensive  -Continue home lisinopril  5 mg daily  -Continue 100 mg metoprolol succinate   -Continue PO diltiazem 360 mg daily   Severe Malnutrition - wt 153 to 154.  Pt with BMI of 17.36 indicating underweight and meeting severe malnutrition criteria (14% weight loss in 4 months). Pt reported unintentional 50 lb weight loss in last year with work-up to date negative  -Continue dronabinol 2.5 mg BID 4-week trial (pt states he could not get this prescription filled) -Nutrition supplements  Insomnia - improved  -Continue  benadryl capsule  PRN   Rash - Pt with pruritic right lower abdominal quadrant and thoracic area for 2 months of unknown etiology. -Apply hydrocortisone 0.5% cream BID    Tobacco Abuse -Continue bupropion 75 mg BID -Continue 7 mg nicotine patch   Diet: Regular DVT Ppx: Coumadin Code: Full   Dispo: Disposition is deferred at this time, awaiting improvement of current medical problems.  Anticipated discharge in approximately 1-2 day(s).   The patient does have a current PCP Axel Filler, MD) and does need an  [facility] hospital follow-up appointment after discharge.  The patient does not have transportation limitations that hinder transportation to clinic appointments.  Services Needed at time of discharge: Y = Yes, Blank = No PT:   OT:   RN:   Equipment:   Other:     LOS: 7 days   Travis Mire, MD 05/29/2013, 7:32 AM

## 2013-05-29 NOTE — Progress Notes (Signed)
ANTICOAGULATION CONSULT NOTE - Follow Up Consult  Pharmacy Consult for Warfarin  Indication: atrial fibrillation  Allergies  Allergen Reactions  . Digoxin And Related     Dig toxicity 2009  . Penicillins Rash   Patient Measurements: Height: 6\' 3"  (190.5 cm) Weight: 154 lb 12.2 oz (70.2 kg) IBW/kg (Calculated) : 84.5  Vital Signs: Temp: 97.9 F (36.6 C) (01/10 0359) Temp src: Oral (01/10 0359) BP: 128/86 mmHg (01/10 0359) Pulse Rate: 82 (01/10 0359)  Labs:  Recent Labs  05/26/13 1705 05/27/13 0415  05/27/13 0810 05/27/13 1605 05/28/13 0518 05/28/13 0556 05/29/13 0259  HGB  --   --   < > 13.4 14.4  --  14.2  --   HCT  --   --   --  39.7 43.4  --  42.1  --   PLT  --   --   --  177 187  --  202  --   LABPROT  --  27.5*  --   --   --   --  30.1* 35.1*  INR  --  2.67*  --   --   --   --  3.00* 3.67*  HEPARINUNFRC 0.18* 0.43  --   --   --   --   --   --   CREATININE  --  1.62*  --   --   --  1.49*  --   --   < > = values in this interval not displayed.  Estimated Creatinine Clearance: 46.5 ml/min (by C-G formula based on Cr of 1.49).   Assessment: 68 y/o M on warfarin for afib. INR supra-therapeutic at 3.67. Home dose 12.5 mg daily but admission INR low with hx of noncompliance.  INR up after 3 doses of 15 mg and 2 doses of 5 mg. Multaq 400 mg po BID stopped today which may lead to increased coumadin requirements.   Goal of Therapy:  INR 2-3   Plan:  1) Hold warfarin dose today 2) F/u daily INR  3) Monitor for s/s of bleeding  Albertina Parr, PharmD.  Clinical Pharmacist Pager [phone]

## 2013-05-30 LAB — GLUCOSE, CAPILLARY
GLUCOSE-CAPILLARY: 95 mg/dL (ref 70–99)
Glucose-Capillary: 99 mg/dL (ref 70–99)

## 2013-05-30 LAB — PROTIME-INR
INR: 2.91 — ABNORMAL HIGH (ref 0.00–1.49)
Prothrombin Time: 29.4 seconds — ABNORMAL HIGH (ref 11.6–15.2)

## 2013-05-30 MED ORDER — METOPROLOL SUCCINATE ER 100 MG PO TB24: 100.0000 mg | ORAL_TABLET | Freq: Every day | ORAL | Status: DC

## 2013-05-30 MED ORDER — DIPHENHYDRAMINE HCL 25 MG PO CAPS: 25.0000 mg | ORAL_CAPSULE | Freq: Every evening | ORAL | Status: DC | PRN

## 2013-05-30 MED ORDER — DILTIAZEM HCL ER COATED BEADS 360 MG PO CP24: 360.0000 mg | ORAL_CAPSULE | Freq: Every day | ORAL | Status: DC

## 2013-05-30 NOTE — Discharge Instructions (Signed)
-  Stop taking multaq -Take toprox-XL 100 mg daily  -Take diltiazem 360 mg daily  -Continue taking coumadin, you will need INR check this week   -You will need to follow-up with Dr. Elie Confer for INR check, Dr Rayann Heman, and Dr Marinda Elk [facility] you were satisified with our care!

## 2013-05-30 NOTE — Progress Notes (Signed)
HR 110-143 Atrial Fib,  B/P120/90 RR 18 02 sat 100 % R/A . Patient denies chest pain or discomfort. MD was informed. We will continue to monitor.

## 2013-05-30 NOTE — Progress Notes (Signed)
Subjective:  Pt seen and examined in AM. Pt with asymptomatic HR of 143 last night. Pt doing well and ready to go home. No complaints.   Objective: Vital signs in last 24 hours: Filed Vitals:   05/29/13 1300 05/29/13 2034 05/30/13 0102 05/30/13 0513  BP: 115/89 148/104 120/90 132/93  Pulse: 94 129 143 122  Temp: 97.1 F (36.2 C) 98.1 F (36.7 C)  97.8 F (36.6 C)  TempSrc: Axillary Oral  Oral  Resp: 16 18 18    Height:      Weight:    71.4 kg (157 lb 6.5 oz)  SpO2: 95% 100% 100% 100%   Weight change: 1.2 kg (2 lb 10.3 oz)  Intake/Output Summary (Last 24 hours) at 05/30/13 1038 Last data filed at 05/30/13 0513  Gross per 24 hour  Intake    600 ml  Output   1650 ml  Net  -1050 ml   Vitals reviewed. General: resting in bed, NAD, on [place] 2L O2 HEENT: PERRL, EOMI Cardiac: normal rate, irregularly irregular Pulm:  Much improved, mild expiratory wheezing Abd: soft, nontender, nondistended, BS present Ext: warm and well perfused, no pedal edema Neuro: alert and oriented X3  Lab Results: Basic Metabolic Panel:  Recent Labs Lab 05/26/13 0404 05/27/13 0415 05/28/13 0518  NA 145 141 140  K 4.6 5.3 5.3  CL 107 104 105  CO2 27 26 24   GLUCOSE 114* 95 138*  BUN 25* 21 24*  CREATININE 1.71* 1.62* 1.49*  CALCIUM 8.6 8.6 8.8  MG 1.8  --  1.8   CBC:  Recent Labs Lab 05/27/13 1605 05/28/13 0556  WBC 8.2 3.6*  HGB 14.4 14.2  HCT 43.4 42.1  MCV 93.1 91.1  PLT 187 202   Cardiac Enzymes:  Recent Labs Lab 05/25/13 0140  TROPONINI <0.30   BNP: No results found for this basename: PROBNP,  in the last 168 hours CBG:  Recent Labs Lab 05/28/13 2117 05/29/13 0620 05/29/13 1110 05/29/13 1619 05/29/13 2059 05/30/13 0604  GLUCAP 158* 107* 96 132* 136* 99   Coagulation:  Recent Labs Lab 05/27/13 0415 05/28/13 0556 05/29/13 0259 05/30/13 0742  LABPROT 27.5* 30.1* 35.1* 29.4*  INR 2.67* 3.00* 3.67* 2.91*   Urine Drug Screen: Drugs of Abuse       Component Value Date/Time   LABOPIA POSITIVE* 10/12/2012 1724   LABOPIA NEGATIVE 12/27/2008 1943   COCAINSCRNUR NONE DETECTED 10/12/2012 1724   COCAINSCRNUR NEG 07/06/2009 2225   LABBENZ NONE DETECTED 10/12/2012 1724   LABBENZ NEG 07/06/2009 2225   AMPHETMU NONE DETECTED 10/12/2012 1724   AMPHETMU NEG 07/06/2009 2225   THCU POSITIVE* 10/12/2012 1724   LABBARB NONE DETECTED 10/12/2012 1724    Studies/Results: No results found. Medications: I have reviewed the patient's current medications. Scheduled Meds: . buPROPion  75 mg Oral BID  . diltiazem  360 mg Oral Daily  . dronabinol  2.5 mg Oral BID AC  . feeding supplement (ENSURE COMPLETE)  237 mL Oral BID BM  . hydrocortisone cream   Topical BID  . lisinopril  5 mg Oral Daily  . metoprolol succinate  100 mg Oral Daily  . nicotine  7 mg Transdermal Daily  . pantoprazole  40 mg Oral Daily  . sodium chloride  3 mL Intravenous Q12H  . tiotropium  18 mcg Inhalation Daily  . Warfarin - Pharmacist Dosing Inpatient   Does not apply q1800   Continuous Infusions: . sodium chloride 20 mL/hr (05/27/13 0824)  PRN Meds:.diphenhydrAMINE, ipratropium, levalbuterol, oxyCODONE, oxyCODONE-acetaminophen Assessment/Plan: Principal Problem:   COPD exacerbation Active Problems:   TOBACCO ABUSE   HYPERTENSION   PAF (paroxysmal atrial fibrillation) on chronic coumadin   BRADYCARDIA-TACHYCARDIA SYNDROME   COPD (chronic obstructive pulmonary disease)   G E R D   OSTEOARTHRITIS   HYPERTHYROIDISM   Chronic kidney disease   Chronic combined systolic and diastolic congestive heart failure   Non-ischemic cardiomyopathy   Atrial fibrillation  Assessment: Mr. Abalos is a 68 year old male with PMH of Atrial fibrillation (on coumadin with sub-therapeutic INR), HTN, CKD 3, COPD, tachy-brady syndrome (with pacemaker), non-ischemic cardiomyopathy, and diastolic HF (EF 62-83% Dec 2014) admitted for atrial flutter/fib RVR.   Plan:   Atrial flutter in setting of  tachy-brady syndrome with failed cardio conversion on 1/8--currently in atrial fibrillation with normal HR .  -Continue metoprolol succinate 100 mg daily  -Continue PO diltiazem 360 mg daily  -Cer cardiology recommendations disontinued dronedarone 400 mg BID  -Continue coumadin per pharmacy and monitoring INR, therapeutic 2.91  -Continue to monitor on telemetry -To follow-up with Dr. Rayann Heman in 1-2 weeks  COPD Moderate Exacerbation - improved wheezing, 100% on RA -Chest Xray 05/27/12 --> Recommend followup radiography in 4-6 weeks - Awaiting sputum culture -Oxygen therapy to keep SpO2 >92% -Breathing treatment Q6 hr  -Continue home Spiriva daily   -Completed prednisone 60 mg 3 day course  Hemoptysis - no further episodes.  Pt reports pink to dark tinged sputum x 3 day, possibly due to PNA vs bronchitis vs AC therapy (currently therapeutic levels). CBC stable.  -Obtain HIV Ab --> NR -Continue to monitor in setting of coumadin, currently with therapeutic INR    Combined Systolic and Diastolic CHF - Pt with 2D-echo on 12/30 with improved systolic function EF 15-17%, no wall motion, and inability to assess diastolic function. Pt without signs of volume overload on exam or CXR.  -Per cardiology recommendations, continue metoprolol succinate 100 mg daily   -Monitor daily weight (154 to 157) and I & O's (1.65L out yesterday)  Chronic Kidney Disease Stage 3 -  Last Cr 1.49 on 1/9, below baseline Cr 2.0 -Avoid nephrotoxins -Continue to monitor renal function  Hypertension - currently normotensive  -Continue home lisinopril 5 mg daily  -Continue 100 mg metoprolol succinate   -Continue PO diltiazem 360 mg daily   Severe Malnutrition - wt 156 to 157.  Pt with BMI of 17.36 indicating underweight and meeting severe malnutrition criteria (14% weight loss in 4 months). Pt reported unintentional 50 lb weight loss in last year with work-up to date negative  -Continue dronabinol 2.5 mg BID 4-week trial  (pt states he could not get this prescription filled) -Nutrition supplements  Insomnia - improved  -D/C benadryl capsule  PRN due to tachycardia   Rash - Pt with pruritic right lower abdominal quadrant and thoracic area for 2 months of unknown etiology. -Apply hydrocortisone 0.5% cream BID    Tobacco Abuse -Continue bupropion 75 mg BID -Continue 7 mg nicotine patch   Diet: Regular DVT Ppx: Coumadin Code: Full   Dispo: Disposition is deferred at this time, awaiting improvement of current medical problems.  Anticipated discharge in approximately 1-2 day(s).   The patient does have a current PCP Axel Filler, MD) and does need an [facility] hospital follow-up appointment after discharge.  The patient does not have transportation limitations that hinder transportation to clinic appointments.  Services Needed at time of discharge: Y = Yes, Blank = No PT:  OT:   RN:   Equipment:   Other:     LOS: 8 days   Juluis Mire, MD 05/30/2013, 10:38 AM

## 2013-05-30 NOTE — Progress Notes (Signed)
Discharged to home accompanied by family member. Discharge instructions given by this nurse and scripts by MD. No questions noted. Pt belongings and cane with patient.  Margarito Liner

## 2013-05-31 ENCOUNTER — Telehealth: Payer: Self-pay | Admitting: Internal Medicine

## 2013-05-31 ENCOUNTER — Ambulatory Visit (INDEPENDENT_AMBULATORY_CARE_PROVIDER_SITE_OTHER): Payer: Medicare HMO | Admitting: Internal Medicine

## 2013-05-31 ENCOUNTER — Other Ambulatory Visit: Payer: Self-pay | Admitting: Internal Medicine

## 2013-05-31 ENCOUNTER — Encounter: Payer: Self-pay | Admitting: Internal Medicine

## 2013-05-31 VITALS — BP 132/80 | HR 116 | Ht 75.0 in | Wt 159.0 lb

## 2013-05-31 DIAGNOSIS — I10 Essential (primary) hypertension: Secondary | ICD-10-CM

## 2013-05-31 DIAGNOSIS — I81 Portal vein thrombosis: Secondary | ICD-10-CM

## 2013-05-31 DIAGNOSIS — I4891 Unspecified atrial fibrillation: Secondary | ICD-10-CM

## 2013-05-31 DIAGNOSIS — F17200 Nicotine dependence, unspecified, uncomplicated: Secondary | ICD-10-CM

## 2013-05-31 DIAGNOSIS — I428 Other cardiomyopathies: Secondary | ICD-10-CM

## 2013-05-31 DIAGNOSIS — Z9981 Dependence on supplemental oxygen: Secondary | ICD-10-CM

## 2013-05-31 MED ORDER — DILTIAZEM HCL 30 MG PO TABS: ORAL_TABLET | ORAL | Status: DC

## 2013-05-31 MED ORDER — NICOTINE 21 MG/24HR TD PT24: 21.0000 mg | MEDICATED_PATCH | TRANSDERMAL | Status: DC

## 2013-05-31 MED ORDER — METOPROLOL SUCCINATE ER 100 MG PO TB24
100.0000 mg | ORAL_TABLET | Freq: Two times a day (BID) | ORAL | Status: DC
Start: 2013-05-31 — End: 2013-06-12

## 2013-05-31 NOTE — Patient Instructions (Signed)
Your physician recommends that you schedule a follow-up appointment as scheduled  Your physician has recommended you make the following change in your medication:  1) Increase Metoprolol to 100mg  twice daily 2) take Diltiazem 30mg  as needed every 6 hours for heart racing

## 2013-05-31 NOTE — Progress Notes (Signed)
1. Wants portable O2 since trouble with portability of tanks. I sent referral to [facility]  2. Wants to quite smoking. Has used patches before and requests. Smokes 3/4 pack per day. No cig since D/C but cravings. He weighs > 4 kg so will Rx 21 mg. Gave one month supply and one refill but will need titrating down. Knows not to smoke cigs.

## 2013-05-31 NOTE — Discharge Summary (Signed)
Name: Travis KRAEMER Sr. MRN: [id number] DOB: 05-27-1945 68 y.o. PCP: [name]  Date of Admission: 05/22/2013 Date of Discharge: 05/30/2013 Attending Physician:   Discharge Diagnosis:  Principal Problem: Atrial flutter/fibrillation in setting of tachy-brady syndrome   Active Problems:  COPD Moderate Exacerbation   Hemoptysis Combined Systolic and Diastolic CHF  Chronic Kidney Disease Stage 3  Hypertension Severe Malnutrition Hyperkalemia Leukocytosis/Leukopenia   Normocytic Anemia Hypoalbuminemia  Insomnia Rash  Tobacco Abuse     Discharge Medications:   Medication List    STOP taking these medications       dronedarone 400 MG tablet  Commonly known as:  MULTAQ      TAKE these medications       albuterol 108 (90 BASE) MCG/ACT inhaler  Commonly known as:  PROVENTIL HFA;VENTOLIN HFA  Inhale 2 puffs into the lungs every 6 (six) hours as needed for wheezing or shortness of breath.     buPROPion 75 MG tablet  Commonly known as:  WELLBUTRIN  Take 1 tablet (75 mg total) by mouth 2 (two) times daily.     diltiazem 360 MG 24 hr capsule  Commonly known as:  CARDIZEM CD  Take 1 capsule (360 mg total) by mouth daily.     dronabinol 2.5 MG capsule  Commonly known as:  MARINOL  Take 1 capsule (2.5 mg total) by mouth 2 (two) times daily before lunch and supper.     lisinopril 5 MG tablet  Commonly known as:  PRINIVIL,ZESTRIL  Take 1 tablet (5 mg total) by mouth daily.     metoprolol succinate 100 MG 24 hr tablet  Commonly known as:  TOPROL-XL  Take 1 tablet (100 mg total) by mouth daily. Take with or immediately following a meal.     omeprazole 20 MG capsule  Commonly known as:  PRILOSEC  Take 40 mg by mouth daily.     oxyCODONE-acetaminophen 10-325 MG per tablet  Commonly known as:  PERCOCET  Take 1 tablet by mouth every 6 (six) hours as needed for pain.     tiotropium 18 MCG inhalation capsule  Commonly known as:  SPIRIVA  Place 1 capsule  (18 mcg total) into inhaler and inhale daily.     warfarin 5 MG tablet  Commonly known as:  COUMADIN  Take 2.5 tablets (12.5 mg total) by mouth daily.        Disposition and follow-up:   Travis F Annunziato Sr. was discharged from [facility] in Good condition.  At the hospital follow up visit please address:  1.  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  Labs / imaging needed at time of follow-up: INR, CBC (wbc), Chest Xray in 4-6 weeks   3.  Pending labs/ test needing follow-up: none  Follow-up Appointments:     Follow-up Information   Follow up with [name]. Call on 05/31/2013.   Specialty:  Internal Medicine   Contact  information:   [place] [address] [phone]       Follow up with Travis Grayer, [name]. Call on 05/31/2013.   Specialty:  Cardiology   Contact information:   [place] [address] [phone]       Follow up with [name], [facility]. Call on 05/31/2013.   Specialty:  Pharmacist   Contact information:   [place] [address] [phone]       Discharge Instructions: Discharge Orders   Future Appointments Provider Department Dept Phone   06/08/2013 11:30 AM Andrez Grime, PA-C [place] Office [phone]   Future Orders Complete By Expires   Diet - low sodium heart healthy  As directed    Increase activity slowly  As directed       Consultations:   Cardiology (Dr. Rayann [name])  Procedures Performed:  Dg Chest 2 View  05/27/2013   CLINICAL DATA:   Shortness of breath  EXAM: CHEST  2 VIEW  COMPARISON:  DG CHEST 2 VIEW dated 05/22/2013; DG CHEST 2 VIEW dated 05/17/2013; CT ABD/PELV WO CM dated 10/12/2012  FINDINGS: There is mild right basilar pleural thickening. There is elevation of the left diaphragm. There is increased opacity in the left retrocardiac region concerning for pneumonia. There is chronic scarring involving the left mid lung. Stable cardiomediastinal silhouette. There is a dual lead cardiac pacer. The osseous structures are unremarkable.  IMPRESSION: Increased airspace opacity in the left retrocardiac region concerning for a left lower lobe pneumonia. Recommend followup radiography in 4-6 weeks, to document complete resolution following adequate medical therapy. If there is not complete resolution, then recommend further evaluation with CT of the chest to exclude underlying pathology.   Electronically Signed   By: Kathreen Devoid   On: 05/27/2013 19:44   Dg Chest 2 View  05/22/2013   CLINICAL DATA:  Chest pain with shortness of breath. History of pacemaker and congestive heart failure.  EXAM: CHEST  2 VIEW  COMPARISON:  05/17/2013 radiographs.  CT 10/12/2012.  FINDINGS: The right subclavian pacemaker leads appear unchanged within the right ventricle. The heart size and mediastinal contours are stable. There is chronic volume loss in the left hemithorax with parenchymal scarring, pleural thickening and pleural calcification. Compared with the prior studies, there is increased density inferiorly suspicious for superimposed airspace disease. The right lung is clear.  IMPRESSION: Increased density in the lower left hemithorax compared with prior studies, suspicious for pneumonia superimposed on chronic pleural parenchymal scarring. Radiographic follow up recommended.   Electronically Signed   By: Camie Patience M.D.   On: 05/22/2013 20:46   Dg Chest 2 View  05/17/2013   CLINICAL DATA:  Dizziness and shortness of breath.  EXAM: CHEST  2 VIEW   COMPARISON:  03/17/2012  FINDINGS: Dual lead pacemaker remains in place. The right lung again shows chronic hyperinflation. Nipple shadow present on the right. The left lung shows chronic pleural and parenchymal scarring, not significantly changed. No evidence of active pneumonia, heart failure, effusion or collapse.  IMPRESSION: Chronic lung disease with hyperinflation on the right and extensive pleural parenchymal scarring on the left. No change compared previous studies.   Electronically Signed   By: Nelson Chimes M.D.   On: 05/17/2013 13:03   Nm Pulmonary Perf And Vent  05/17/2013   CLINICAL DATA:  History of DVT and COPD, now with shortness of breath for 2 weeks, evaluate for pulmonary embolism  EXAM: NUCLEAR MEDICINE VENTILATION - PERFUSION LUNG SCAN  TECHNIQUE: Ventilation images were obtained in multiple projections using inhaled aerosol technetium 99 M DTPA. Perfusion images were obtained in multiple projections after intravenous injection of Tc-20m MAA.  COMPARISON:  Chest radiograph - 05/17/2013; 08/14/2011; chest CT -03/18/2012; VQ scan - 03/18/2012  RADIOPHARMACEUTICALS:  40 mCi Tc-70m DTPA aerosol and 6 mCi Tc-9m MAA  FINDINGS: Review of chest radiograph performed earlier same day demonstrates grossly unchanged borderline enlarged cardiac silhouette and mediastinal contours. A pacer power pack overlies the peripheral aspect of the right upper/mid lung. The lungs remain hyperexpanded. There is grossly unchanged architectural distortion and volume loss primarily involving the left mid lung. Grossly unchanged elevation of the left hemidiaphragm and left apical asymmetric pleural parenchymal thickening. Sequela of prior talc pleurodesis involving the left lung is not well demonstrated for was seen on remote chest CT. No definite pleural effusion or pneumothorax.  Ventilation: There is clumping of inhaled radiotracer about the bilateral pulmonary hila, right greater than left, similar to prior V/Q scan.  There is grossly unchanged hypoventilation of the left mid and lower lungs with mottled undulation of the left upper lung. There is preferential ventilation of the right mid and lower lung. There is an expected photopenic defect at the location of the right anterior chest wall pacemaker power pack. Ingested radiotracer is seen within the hypopharynx distal esophagus and stomach.  Perfusion: There is matched hypoperfusion involving in the left mid and lower lung with matched mottled perfusion involving the remainder of the left lung. There is an expected photopenia defect at the location of the patient's known right anterior chest wall power pack. Otherwise, there is relative homogeneous perfusion of the right lung parenchyma.  IMPRESSION: 1. Pulmonary embolism absent (low probability for pulmonary embolism) - grossly unchanged from prior V/Q scan performed 02/2012. 2. Persistent volume loss involving the left mid and lower lung with associated matched decreased ventilation and perfusion.   Electronically Signed   By: Sandi Mariscal M.D.   On: 05/17/2013 16:45    2D Echo: TEE on 05/27/13  Left ventricle: The cavity size was normal. Wall thickness was normal. Systolic function was mildly reduced. The estimated ejection fraction was in the range of 45% to 50%. - Aortic valve: No evidence of vegetation. Mild regurgitation. - Aorta: The aorta was mildly dilated (4.04cm ascending root)and mildly calcified. - Mitral valve: No evidence of vegetation. Mild regurgitation. - Left atrium: No evidence of thrombus in the atrial cavity or appendage. No evidence of thrombus in the appendage. There was spontaneous echo contrast ("smoke"). - Right atrium: No evidence of thrombus in the atrial cavity or appendage. - Atrial septum: No defect or patent foramen ovale was identified. Echo contrast study showed no right-to-left atrial level shunt, following an increase in RA pressure induced by provocative maneuvers. -  Tricuspid valve: No evidence of vegetation. No evidence of vegetation. - Pulmonic valve: No evidence of vegetation. - Line: A venous pacing wire was visualized in the right atrial cavity and right ventricular cavity, with its tip at the RV apex. - Superior vena cava: The study excluded a thrombus.    Cardiac Cath: none  Admission HPI: Original Author Duwaine Maxin, DO  Mr. Krauskopf is a 68 year old male with a PMH of atrial fibrillation (on coumadin with sub-therapeutic INR and history of medicine non-compliance), HTN, CKD, COPD (on 2L home oxygen prn), tachy-brady syndrome (with pacemaker), non-ischemic cardiomyopathy and diastolic HF (EF 0000000 Dec 2014) who was recently  admitted (12/29-12/31/2014) and treated for COPD exacerbation. Today he was seen by visiting nurse and found to have a HR in 140s. He was instructed to take an additional dose of Toprol 25mg  but his HR remained elevated (136) so he was instructed to go to the ED.   Mr. Jalali says he was asymptomatic during the episodes of tachycardia. His main complaint is continued cough and dyspnea. The cough has been present for over 1 month and is productive of white sputum. He also feels more SOB than normal and has had to increase his home oxygen use and albuterol use. He normally uses 2L home oxygen as needed but he has started using it almost every night and sometimes increases to 2.5 or 3L. He is using his albuterol inhaler 3-4 times daily.   He also reports left-sided non-radiating, non-pleuritic CP occurring intermittently yesterday. The pain lasted 10-15 minutes at a time and he did not try anything to relieve it. He denies CP at present.  In the ED: T98.66F, RR 18, SpO2 98%, HR 130, BP 148/90; Cardizem drip started   Hospital Course by problem list:  Atrial flutter/fibrillation in setting of tachy-brady syndrome - Pt recently hospitalized with decrease in metoprolol succinate (50 to 25 mg daily) and re-setting of pacemaker in  setting of symptomatic bradycardia who presented with asymptomatic tachycardia and found to be in atrial fibrillation with rapid ventricular response. Pt received diltiazem (IV then PO 360 mg daily) with failed cardio conversion on 1/8. Pt's pacemaker was interrogated and was functioning normally. Pt was monitored on telemetry during hospitalization and  remained in atrial fibrillation throughout hospitalization with intermittent tachycardia and normal cardiac enzymes. Heart rate ranged form 60-143. TEE revealed EF of 45-50% with no evidence of vegetations. Pt initially received metoprolol succinate 50 mg and then 100 mg daily (after failed cardioversion). Per cardiology recommendations pt to discontinue use of dronedarone at home and continue diltiazem 360 mg daily and metoprolol succinate 100 mg daily as there were not any good anti-arrhythmic options. Pt was stable for discharge from cardiology and to follow-up in 1-2 weeks.  Pt with sub-therapeutic INR level of 1.33 on admission. Pt was continued on coumadin per pharmacy during hospitalization and received bridging with IV heparin prior to cardioconverison to achieve therapeutic INR level (2.91 on discharge). Pt to continue home coumadin and receive INR check in coming week. Thyroid function was within normal limits.    COPD Moderate Exacerbation - Pt with chronic cough and dyspnea with exertion who recently completed 5 day course of azithromycin and prednisone course. Initial chest x-ray revealed chronic left sided pleural thickening with calcification and parenchymal scarring similar to previous chest x-rays. Pt initially received IV vancomycin and zosyn for HCAP and then received doxycycline 100 mg by mouth twice a day for 5 days and prednisone 60 mg daily for 3 days for COPD exacerbation. Chest x-ray on 1/8 was concerning for left lower lobe pneumonia and follow-up imaging recommended in 4-6 weeks for resolution.  Pt tested positive for urinary strep  pneumoniae and found to be negative for Legionella, however no consolidation on imaging or fever and leukocytosis to suggest infectious etiology. Pt received oxygen therapy and breathing treatment as needed and daily maintenance inhaler therapy during hospitalization. Pt wth improved respiratory status during hospitalization with normal oxygen saturation with ambulation. Pt to continue maintenance COPD inhaler therapy at home.      Hemoptysis - Pt reported pink to dark tinged sputum for three days during hospitalization with  resolution with corticosteroid therapy most likely due to acute on chronic bronchitis. During occurrence pt with therapeutic INR levels and stable hemoglobin. Pt with negative HIV test.   Combined Systolic and Diastolic CHF - Pt with elevated pro-BNP of 9662 with no pulmonary vascular congestion on chest xray on admission or weight gain to suggest exacerbation. Pt remained euvolemic during hospitalization. Pt with 2D-echo on 12/30 with improved systolic function EF 71-69%, no wall motion, and inability to assess diastolic function. TEE on on 05/27/13 revealed EF of 45-50%.   Per cardiology recommendations, pt to take metoprolol succinate 100 mg daily.   Chronic Kidney Disease Stage 3 - Pt with Cr of 1.55 on admission and 1.49 on discharge, below baseline Cr 2.0 with stable levels throughout hospitalization. Nephrotoxins were avoided during hospitalization.  Hypertension - Blood pressure range between 107/67 to 158/116 during hospitalization. Pt was continued on home lisinopril, metoprolol succinate, and diltiazem daily.    Severe Malnutrition - Per nutrition consult, pt with severe malnutrition in the context of chronic illness as evidenced by < 75% intake of estimated energy requirement for > 1 month and severe muscle loss (clavicles, deltoids, pectoralis). Pt reported unintentional 50 lb weight loss in last year with work-up to date negative. Pt was continued on dronabinol 2.5 mg BID,  prescribed at last hospitalization for  4-week trial (pt states he could not get this prescription filled). Pt with weight gain of 1 lb (156 to 157lb) during hospitalization. Pt received d nutrition supplements as tolerated. Pt to continue using dronabinol at home as previously prescribed and abstain from recreational marijuana.    Hyperkalemia - Pt was potassium level of 5.5 during hospitalization and received kayexylate with normalization of levels.  Leukocytosis/Leukopenia - Pt presented with WBC of 11.7 most likely due to recent corticosteroid course with normalization during hospitalization and mild leukopenia (3.6) on last CBC obtained. Pt also with +Strep Pneum urine antigen however no consolidation on imaging or fever to suggest infectious etiology. Pt to have repeat CBC at outpatient follow-up to ensure resolution of leukopenia.    Normocytic Anemia - Pt with normocytic anemia during hospitalization that resolved.  Pt with hemoptysis during hospitalization that resolved during hospitalization with stable hemoglobin during occurrence.     Hypoalbuminemia - Pt with albumin level of 3.0 in setting of severe malnutrition most likely due to chronic protein calorie malnutrition. Pt received adequate nutrition during hospitalization.   Insomnia - Pt with improved sleeping with benadryl capsule, however not recommended to continue at discharge due to history of tachycardia. OTC melatonin suggested to aid in sleep.      Rash - Pt with pruritic right lower abdominal quadrant and thoracic area for 2 months of unknown etiology. Pt was continued on hydrocortisone 0.5% cream BID with no worsening of rash.    Tobacco Abuse - Pt was continued on bupropion 75 mg BID and 7 mg nicotine patch during hospitalization with no reported nicotine cravings.     Discharge Vitals:   BP 132/93  Pulse 122  Temp(Src) 97.8 F (36.6 C) (Oral)  Resp 18  Ht 6\' 3"  (1.905 m)  Wt 71.4 kg (157 lb 6.5 oz)  BMI 19.67 kg/m2   SpO2 100%  Discharge Labs:  Results for orders placed during the hospital encounter of 05/22/13 (from the past 24 hour(s))  GLUCOSE, CAPILLARY     Status: None   Collection Time    05/30/13  6:04 AM      Result Value Range   Glucose-Capillary  99  70 - 99 mg/dL  PROTIME-INR     Status: Abnormal   Collection Time    05/30/13  7:42 AM      Result Value Range   Prothrombin Time 29.4 (*) 11.6 - 15.2 seconds   INR 2.91 (*) 0.00 - 1.49  GLUCOSE, CAPILLARY     Status: None   Collection Time    05/30/13 11:34 AM      Result Value Range   Glucose-Capillary 95  70 - 99 mg/dL   Comment 1 Notify RN      Signed: Juluis Mire, [name] 05/31/2013, 5:29 AM   Time Spent on Discharge: 35 minutes Services Ordered on Discharge: none Equipment Ordered on Discharge: none

## 2013-05-31 NOTE — Progress Notes (Signed)
PCP:  Axel Filler, MD  The patient presents today for urgent electrophysiology followup.  Since leaving the hospital, the patient reports ongoing afib with RVR.  He reports mild chest tightness and SOB.  He reports ongoing cough.  Unfortunately he has started smoking again.  Today, he denies symptoms of  orthopnea, PND, lower extremity edema, dizziness, presyncope, syncope, or neurologic sequela.  The patient feels that he is tolerating medications without difficulties and is otherwise without complaint today.   Past Medical History  Diagnosis Date  . Hypertension   . CKD (chronic kidney disease)     Renal U/S 12/04/2009 showed no pathological findings. Labs 12/04/2009 include normal ESR, C3, C4; neg ANA; SPEP showed nonspecific increase in the alpha-2 region with no M-spike; UPEP showed no monoclonal free light chains; urine IFE showed polyclonal increase in feree Kappa and/or free Lambda light chains. Baseline Cr reported 1.7-2.5.  [name] Chronic systolic heart failure     a. NICM - EF 35% with normal cors 2003. b. Last echo 11/2012 - EF 25-30%.  . Depression   . GERD (gastroesophageal reflux disease)   . Portal vein thrombosis   . Avascular necrosis     right hip s/p replacement  . Gastritis   . Alcohol abuse   . Erectile dysfunction   . Bipolar disorder   . Hydrocele, unspecified   . Intermittent claudication   . Lung nodule     Chest CT scan on 12/14/2008 showed a nodular opacity at the left lung base felt to most likely represent scarring.  Follow-up chest CT scan on 06/02/2010 showed parenchymal scarring in the left apex, left  lower lobe, and lingula; some of this scarring at the left base had a nodular appearance, unchanged. Chest 09/2012 - stable.  . 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  Thyroid scan 08/28/2009 was normal with no focal areas of abnormal increased or decreased activity seen; the uptake of I 131 sodium iodide at 24 hours  was 5.7%.  TSH and free T4 normalized by 08/16/2009.  [name] Pneumonia   . Intestinal obstruction   . COPD (chronic obstructive pulmonary disease)   . Clostridium difficile colitis   . E coli bacteremia   . Cluster headaches   . DVT (deep venous thrombosis)     He has hypercoagulability with multiple prior DVTs  . Pleural plaque     H/o asbestos exposure. Chest CT on 06/02/2010 showed stable extensive calcified pleural plaques involving the left hemithorax, consistent with asbestos related pleural disease.  . Weight loss     Normal colonoscopy by Dr. Olevia Perches on 11/06/2010.  . Tachycardia-bradycardia syndrome     a. 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 b. St. Jude gen change 2010.  [name] PAF (paroxysmal atrial fibrillation)     Paroxysmal, failed medical therapy with amiodarone but has done very well with multaq  . Osteoarthritis   . Spondylosis   . Chronic low back pain     "cause of my hip" (03/17/2012)  . 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  . Noncompliance   . Atrial flutter   . Thrombocytopenia    Past Surgical History  Procedure Laterality Date  . Left hip bipolar hemiarthroplasty  09/09/2002    By Dr. Lafayette Dragon  . Insert / replace / remove pacemaker  02/2003    By Dr. Roe Rutherford, gen change  by Greggory Brandy  . Total hip arthroplasty  03/08/2008    right; By Dr. Hiram Comber  . Tee without cardioversion N/A 05/27/2013    Procedure: TRANSESOPHAGEAL ECHOCARDIOGRAM (TEE);  Surgeon: Candee Furbish, MD;  Location: [facility] ENDOSCOPY;  Service: Cardiovascular;  Laterality: N/A;  . Cardioversion N/A 05/27/2013    Procedure: CARDIOVERSION;  Surgeon: Candee Furbish, MD;  Location: [facility] ENDOSCOPY;  Service: Cardiovascular;  Laterality: N/A;    Current Outpatient Prescriptions  Medication Sig Dispense Refill  . albuterol (PROVENTIL HFA;VENTOLIN HFA) 108 (90 BASE) MCG/ACT inhaler Inhale 2 puffs into the lungs  every 6 (six) hours as needed for wheezing or shortness of breath.      [name] buPROPion (WELLBUTRIN) 75 MG tablet Take 1 tablet (75 mg total) by mouth 2 (two) times daily.  60 tablet  5  . diltiazem (CARDIZEM CD) 360 MG 24 hr capsule Take 1 capsule (360 mg total) by mouth daily.  30 capsule  1  . dronabinol (MARINOL) 2.5 MG capsule Take 1 capsule (2.5 mg total) by mouth 2 (two) times daily before lunch and supper.  60 capsule  0  . lisinopril (PRINIVIL,ZESTRIL) 5 MG tablet Take 1 tablet (5 mg total) by mouth daily.  90 tablet  3  . metoprolol succinate (TOPROL-XL) 100 MG 24 hr tablet Take 1 tablet (100 mg total) by mouth 2 (two) times daily. Pt took 150 mg this morning around 8:45am (05/31/12)  180 tablet  3  . omeprazole (PRILOSEC) 20 MG capsule Take 40 mg by mouth daily.      [name] oxyCODONE-acetaminophen (PERCOCET) 10-325 MG per tablet Take 1 tablet by mouth every 6 (six) hours as needed for pain.  120 tablet  0  . tiotropium (SPIRIVA) 18 MCG inhalation capsule Place 1 capsule (18 mcg total) into inhaler and inhale daily.  30 capsule  11  . warfarin (COUMADIN) 5 MG tablet Take 2.5 tablets (12.5 mg total) by mouth daily.  30 tablet  1  . diltiazem (CARDIZEM) 30 MG tablet Take as needed  1 by mouth every 6 hours for heart racing  45 tablet  1   No current facility-administered medications for this visit.    Allergies  Allergen Reactions  . Digoxin And Related     Dig toxicity 2009  . Penicillins Rash    History   Social History  . Marital Status: Widowed    Spouse Name: N/A    Number of Children: 10  . Years of Education: N/A   Occupational History  . RETIRED    Social History Main Topics  . Smoking status: Current Every Day Smoker -- 0.50 packs/day for 50 years    Types: Cigarettes  . Smokeless tobacco: Never Used  . Alcohol Use: No     Comment: prior etoh abuse  . Drug Use: 1.00 per week    Special: Marijuana     Comment: prior marijuana  . Sexual Activity: Yes   Other Topics  Concern  . Not on file   Social History Narrative   Works as a Architect part time   Smoker 1 pack last 1.5 days tobacco, smokes marijuana    Denies EtOH x 4 years (on 10/12/12)   12 kids    From [place]    [place] Veteran    12 grade education              Family History  Problem Relation Age of Onset  . Hypertension Mother   . Cancer Brother  Unsure of type.  . Asthma Mother   . Coronary artery disease Mother   . Colon cancer Neg Hx   . Lung cancer Neg Hx   . Prostate cancer Neg Hx     ROS-  All systems are reviewed and are negative except as outlined in the HPI above  Physical Exam: Filed Vitals:   05/31/13 1004  BP: 132/80  Pulse: 116  Height: '6\' 3"'  (1.905 m)  Weight: 159 lb (72.122 kg)    GEN- The patient is well appearing, alert and oriented x 3 today.   Head- normocephalic, atraumatic Eyes-  Sclera clear, conjunctiva pink Ears- hearing intact Oropharynx- clear Neck- supple, no JVP Lymph- no cervical lymphadenopathy Lungs- coarse rhonchi at the bases, normal work of breathing Heart- irregular rate and rhythm, no murmurs, rubs or gallops, PMI not laterally displaced GI- soft, NT, ND, + BS Extremities- no clubbing, cyanosis, or edema  ekg today reveals afib, V rate 103, bpm, nonspecific ST/T changes  Assessment and Plan:  1. afib with RVR Increase metoprolol to 15m BID Continue coumadin He is given cardizem 351mwhich he can use as needed  2. Tobacco Cessation is strongly advised He is not yet ready to quit  3. Prior DVT/PE Continue coumadin long term  Keep follow-up as scheduled

## 2013-05-31 NOTE — Telephone Encounter (Signed)
Pt called in to clarify his medication changes.   Reviewed today's changes. Pt verbalizes understanding.

## 2013-05-31 NOTE — Telephone Encounter (Signed)
New message  C/O chest pain . Pain level  8 .

## 2013-05-31 NOTE — Telephone Encounter (Signed)
Pt was discharged from hospital yesterday. Per pt he had unsuccessful cardioversion Thursday and it was decided he would be treated medically for atrial fibrillation.   Starting last evening and lasting through this morning he has chest pain, lightheadedness, he is "off balance" SOB and weak.  Symptoms are same as he has been experiencing prior to hospitalization.  HR 122 and BP 132/93 upon discharge yesterday. He took his evening medications last night. He has no means to check his BP and HR today. Spoke with Dr. Rayann Heman who will see patient today. Pt aware and will come in this am.

## 2013-05-31 NOTE — Telephone Encounter (Signed)
Had message that pt had ? About recent hospital stay and wanted to speak to me. Called. No answer. Left message.

## 2013-06-02 ENCOUNTER — Encounter (HOSPITAL_COMMUNITY): Payer: Self-pay | Admitting: Emergency Medicine

## 2013-06-02 ENCOUNTER — Emergency Department (HOSPITAL_COMMUNITY): Payer: Medicare HMO

## 2013-06-02 ENCOUNTER — Inpatient Hospital Stay (HOSPITAL_COMMUNITY): Payer: Medicare HMO

## 2013-06-02 ENCOUNTER — Inpatient Hospital Stay (HOSPITAL_COMMUNITY)
Admission: EM | Admit: 2013-06-02 | Discharge: 2013-06-12 | DRG: 309 | Disposition: A | Discharge status: Home / Self Care | Payer: Medicare HMO | Attending: Internal Medicine | Admitting: Internal Medicine

## 2013-06-02 DIAGNOSIS — I482 Chronic atrial fibrillation, unspecified: Secondary | ICD-10-CM | POA: Diagnosis present

## 2013-06-02 DIAGNOSIS — N189 Chronic kidney disease, unspecified: Secondary | ICD-10-CM

## 2013-06-02 DIAGNOSIS — J449 Chronic obstructive pulmonary disease, unspecified: Secondary | ICD-10-CM

## 2013-06-02 DIAGNOSIS — I509 Heart failure, unspecified: Secondary | ICD-10-CM | POA: Diagnosis present

## 2013-06-02 DIAGNOSIS — E875 Hyperkalemia: Secondary | ICD-10-CM

## 2013-06-02 DIAGNOSIS — E0590 Thyrotoxicosis, unspecified without thyrotoxic crisis or storm: Secondary | ICD-10-CM | POA: Diagnosis present

## 2013-06-02 DIAGNOSIS — I5032 Chronic diastolic (congestive) heart failure: Secondary | ICD-10-CM

## 2013-06-02 DIAGNOSIS — I4891 Unspecified atrial fibrillation: Principal | ICD-10-CM

## 2013-06-02 DIAGNOSIS — Z681 Body mass index (BMI) 19 or less, adult: Secondary | ICD-10-CM

## 2013-06-02 DIAGNOSIS — I10 Essential (primary) hypertension: Secondary | ICD-10-CM

## 2013-06-02 DIAGNOSIS — Z91199 Patient's noncompliance with other medical treatment and regimen due to unspecified reason: Secondary | ICD-10-CM

## 2013-06-02 DIAGNOSIS — Z96649 Presence of unspecified artificial hip joint: Secondary | ICD-10-CM

## 2013-06-02 DIAGNOSIS — G8929 Other chronic pain: Secondary | ICD-10-CM | POA: Diagnosis present

## 2013-06-02 DIAGNOSIS — Z77090 Contact with and (suspected) exposure to asbestos: Secondary | ICD-10-CM

## 2013-06-02 DIAGNOSIS — M1990 Unspecified osteoarthritis, unspecified site: Secondary | ICD-10-CM | POA: Diagnosis present

## 2013-06-02 DIAGNOSIS — Z87891 Personal history of nicotine dependence: Secondary | ICD-10-CM | POA: Diagnosis present

## 2013-06-02 DIAGNOSIS — J4489 Other specified chronic obstructive pulmonary disease: Secondary | ICD-10-CM | POA: Diagnosis present

## 2013-06-02 DIAGNOSIS — F17200 Nicotine dependence, unspecified, uncomplicated: Secondary | ICD-10-CM | POA: Diagnosis present

## 2013-06-02 DIAGNOSIS — N183 Chronic kidney disease, stage 3 unspecified: Secondary | ICD-10-CM | POA: Diagnosis present

## 2013-06-02 DIAGNOSIS — F319 Bipolar disorder, unspecified: Secondary | ICD-10-CM | POA: Diagnosis present

## 2013-06-02 DIAGNOSIS — N179 Acute kidney failure, unspecified: Secondary | ICD-10-CM | POA: Diagnosis present

## 2013-06-02 DIAGNOSIS — I5023 Acute on chronic systolic (congestive) heart failure: Secondary | ICD-10-CM

## 2013-06-02 DIAGNOSIS — Z86718 Personal history of other venous thrombosis and embolism: Secondary | ICD-10-CM

## 2013-06-02 DIAGNOSIS — F1010 Alcohol abuse, uncomplicated: Secondary | ICD-10-CM | POA: Diagnosis present

## 2013-06-02 DIAGNOSIS — N2589 Other disorders resulting from impaired renal tubular function: Secondary | ICD-10-CM

## 2013-06-02 DIAGNOSIS — Z7901 Long term (current) use of anticoagulants: Secondary | ICD-10-CM

## 2013-06-02 DIAGNOSIS — E43 Unspecified severe protein-calorie malnutrition: Secondary | ICD-10-CM

## 2013-06-02 DIAGNOSIS — I129 Hypertensive chronic kidney disease with stage 1 through stage 4 chronic kidney disease, or unspecified chronic kidney disease: Secondary | ICD-10-CM | POA: Diagnosis present

## 2013-06-02 DIAGNOSIS — I495 Sick sinus syndrome: Secondary | ICD-10-CM

## 2013-06-02 DIAGNOSIS — I5042 Chronic combined systolic (congestive) and diastolic (congestive) heart failure: Secondary | ICD-10-CM | POA: Diagnosis present

## 2013-06-02 DIAGNOSIS — Z950 Presence of cardiac pacemaker: Secondary | ICD-10-CM

## 2013-06-02 DIAGNOSIS — I428 Other cardiomyopathies: Secondary | ICD-10-CM

## 2013-06-02 DIAGNOSIS — Z9119 Patient's noncompliance with other medical treatment and regimen: Secondary | ICD-10-CM

## 2013-06-02 DIAGNOSIS — K219 Gastro-esophageal reflux disease without esophagitis: Secondary | ICD-10-CM | POA: Diagnosis present

## 2013-06-02 DIAGNOSIS — F1210 Cannabis abuse, uncomplicated: Secondary | ICD-10-CM | POA: Diagnosis present

## 2013-06-02 LAB — BASIC METABOLIC PANEL
BUN: 33 mg/dL — AB (ref 6–23)
CHLORIDE: 104 meq/L (ref 96–112)
CO2: 25 meq/L (ref 19–32)
CREATININE: 1.63 mg/dL — AB (ref 0.50–1.35)
Calcium: 8.8 mg/dL (ref 8.4–10.5)
GFR calc Af Amer: 48 mL/min — ABNORMAL LOW (ref >90)
GFR calc non Af Amer: 41 mL/min — ABNORMAL LOW (ref >90)
Glucose, Bld: 132 mg/dL — ABNORMAL HIGH (ref 70–99)
Potassium: 5.3 mEq/L (ref 3.7–5.3)
Sodium: 140 mEq/L (ref 137–147)

## 2013-06-02 LAB — CBC
HCT: 43.6 % (ref 39.0–52.0)
HEMOGLOBIN: 14.7 g/dL (ref 13.0–17.0)
MCH: 31.1 pg (ref 26.0–34.0)
MCHC: 33.7 g/dL (ref 30.0–36.0)
MCV: 92.4 fL (ref 78.0–100.0)
PLATELETS: 231 10*3/uL (ref 150–400)
RBC: 4.72 MIL/uL (ref 4.22–5.81)
RDW: 15.8 % — ABNORMAL HIGH (ref 11.5–15.5)
WBC: 13.1 10*3/uL — ABNORMAL HIGH (ref 4.0–10.5)

## 2013-06-02 LAB — URINALYSIS, ROUTINE W REFLEX MICROSCOPIC
Bilirubin Urine: NEGATIVE
GLUCOSE, UA: NEGATIVE mg/dL
Hgb urine dipstick: NEGATIVE
Ketones, ur: NEGATIVE mg/dL
LEUKOCYTES UA: NEGATIVE
NITRITE: NEGATIVE
Protein, ur: NEGATIVE mg/dL
SPECIFIC GRAVITY, URINE: 1.022 (ref 1.005–1.030)
Urobilinogen, UA: 1 mg/dL (ref 0.0–1.0)
pH: 6 (ref 5.0–8.0)

## 2013-06-02 LAB — POCT I-STAT TROPONIN I: TROPONIN I, POC: 0 ng/mL (ref 0.00–0.08)

## 2013-06-02 LAB — COMPREHENSIVE METABOLIC PANEL
ALK PHOS: 68 U/L (ref 39–117)
ALT: 35 U/L (ref 0–53)
AST: 26 U/L (ref 0–37)
Albumin: 3.5 g/dL (ref 3.5–5.2)
BUN: 34 mg/dL — ABNORMAL HIGH (ref 6–23)
CALCIUM: 9.1 mg/dL (ref 8.4–10.5)
CO2: 22 mEq/L (ref 19–32)
Chloride: 102 mEq/L (ref 96–112)
Creatinine, Ser: 1.71 mg/dL — ABNORMAL HIGH (ref 0.50–1.35)
GFR calc non Af Amer: 39 mL/min — ABNORMAL LOW (ref >90)
GFR, EST AFRICAN AMERICAN: 45 mL/min — AB (ref >90)
GLUCOSE: 116 mg/dL — AB (ref 70–99)
POTASSIUM: 5.4 meq/L — AB (ref 3.7–5.3)
SODIUM: 139 meq/L (ref 137–147)
Total Bilirubin: 0.3 mg/dL (ref 0.3–1.2)
Total Protein: 7.1 g/dL (ref 6.0–8.3)

## 2013-06-02 LAB — PROTIME-INR
INR: 3.02 — AB (ref 0.00–1.49)
Prothrombin Time: 30.2 seconds — ABNORMAL HIGH (ref 11.6–15.2)

## 2013-06-02 LAB — APTT: aPTT: 35 seconds (ref 24–37)

## 2013-06-02 LAB — PRO B NATRIURETIC PEPTIDE: PRO B NATRI PEPTIDE: 3125 pg/mL — AB (ref 0–125)

## 2013-06-02 MED ORDER — OXYCODONE-ACETAMINOPHEN 5-325 MG PO TABS
1.0000 | ORAL_TABLET | Freq: Four times a day (QID) | ORAL | Status: DC | PRN
  Administered 2013-06-03 – 2013-06-12 (×26): 1 via ORAL
  Filled 2013-06-02 (×27): qty 1

## 2013-06-02 MED ORDER — PANTOPRAZOLE SODIUM 40 MG PO TBEC
40.0000 mg | DELAYED_RELEASE_TABLET | Freq: Every day | ORAL | Status: DC
  Administered 2013-06-02 – 2013-06-12 (×11): 40 mg via ORAL
  Filled 2013-06-02 (×10): qty 1

## 2013-06-02 MED ORDER — DOFETILIDE 250 MCG PO CAPS
250.0000 ug | ORAL_CAPSULE | Freq: Two times a day (BID) | ORAL | Status: DC
  Administered 2013-06-02 – 2013-06-06 (×8): 250 ug via ORAL
  Filled 2013-06-02 (×9): qty 1

## 2013-06-02 MED ORDER — MAGNESIUM OXIDE 400 (241.3 MG) MG PO TABS
200.0000 mg | ORAL_TABLET | Freq: Every day | ORAL | Status: DC
  Administered 2013-06-02 – 2013-06-12 (×11): 200 mg via ORAL
  Filled 2013-06-02 (×11): qty 0.5

## 2013-06-02 MED ORDER — DILTIAZEM HCL ER COATED BEADS 360 MG PO CP24
360.0000 mg | ORAL_CAPSULE | Freq: Every day | ORAL | Status: DC
  Administered 2013-06-03 – 2013-06-10 (×8): 360 mg via ORAL
  Filled 2013-06-02 (×10): qty 1

## 2013-06-02 MED ORDER — METOPROLOL SUCCINATE ER 100 MG PO TB24
100.0000 mg | ORAL_TABLET | Freq: Two times a day (BID) | ORAL | Status: DC
  Administered 2013-06-02 – 2013-06-12 (×20): 100 mg via ORAL
  Filled 2013-06-02 (×22): qty 1

## 2013-06-02 MED ORDER — NITROGLYCERIN 0.4 MG SL SUBL: 0.4000 mg | SUBLINGUAL_TABLET | SUBLINGUAL | Status: DC | PRN

## 2013-06-02 MED ORDER — ZOLPIDEM TARTRATE 5 MG PO TABS: 5.0000 mg | ORAL_TABLET | Freq: Every evening | ORAL | Status: DC | PRN

## 2013-06-02 MED ORDER — SODIUM CHLORIDE 0.9 % IJ SOLN: 3.0000 mL | INTRAMUSCULAR | Status: DC | PRN

## 2013-06-02 MED ORDER — SODIUM CHLORIDE 0.9 % IV SOLN: 250.0000 mL | INTRAVENOUS | Status: DC | PRN

## 2013-06-02 MED ORDER — SODIUM CHLORIDE 0.9 % IJ SOLN
3.0000 mL | Freq: Two times a day (BID) | INTRAMUSCULAR | Status: DC
Start: 2013-06-02 — End: 2013-06-08
  Administered 2013-06-02 – 2013-06-07 (×5): 3 mL via INTRAVENOUS

## 2013-06-02 MED ORDER — ALBUTEROL SULFATE HFA 108 (90 BASE) MCG/ACT IN AERS: 2.0000 | INHALATION_SPRAY | Freq: Four times a day (QID) | RESPIRATORY_TRACT | Status: DC | PRN

## 2013-06-02 MED ORDER — MORPHINE SULFATE 2 MG/ML IJ SOLN
2.0000 mg | Freq: Once | INTRAMUSCULAR | Status: AC
  Administered 2013-06-02: 2 mg via INTRAVENOUS
  Filled 2013-06-02: qty 1

## 2013-06-02 MED ORDER — OXYCODONE-ACETAMINOPHEN 10-325 MG PO TABS: 1.0000 | ORAL_TABLET | Freq: Four times a day (QID) | ORAL | Status: DC | PRN

## 2013-06-02 MED ORDER — LISINOPRIL 5 MG PO TABS
5.0000 mg | ORAL_TABLET | Freq: Every day | ORAL | Status: DC
  Administered 2013-06-02: 5 mg via ORAL
  Filled 2013-06-02 (×2): qty 1

## 2013-06-02 MED ORDER — ALPRAZOLAM 0.25 MG PO TABS
0.2500 mg | ORAL_TABLET | Freq: Two times a day (BID) | ORAL | Status: DC | PRN
  Administered 2013-06-05 – 2013-06-07 (×2): 0.25 mg via ORAL
  Filled 2013-06-02 (×3): qty 1

## 2013-06-02 MED ORDER — ALBUTEROL SULFATE (2.5 MG/3ML) 0.083% IN NEBU: 2.5000 mg | INHALATION_SOLUTION | Freq: Four times a day (QID) | RESPIRATORY_TRACT | Status: DC | PRN

## 2013-06-02 MED ORDER — WARFARIN - PHARMACIST DOSING INPATIENT
Freq: Every day | Status: DC
  Administered 2013-06-02 – 2013-06-04 (×2)

## 2013-06-02 MED ORDER — WARFARIN SODIUM 7.5 MG PO TABS
7.5000 mg | ORAL_TABLET | Freq: Once | ORAL | Status: AC
Start: 2013-06-02 — End: 2013-06-02
  Administered 2013-06-02: 7.5 mg via ORAL
  Filled 2013-06-02: qty 1

## 2013-06-02 MED ORDER — DRONABINOL 2.5 MG PO CAPS
2.5000 mg | ORAL_CAPSULE | Freq: Two times a day (BID) | ORAL | Status: DC
  Administered 2013-06-02 – 2013-06-12 (×20): 2.5 mg via ORAL
  Filled 2013-06-02 (×21): qty 1

## 2013-06-02 MED ORDER — DOFETILIDE 250 MCG PO CAPS: 250.0000 ug | ORAL_CAPSULE | Freq: Two times a day (BID) | ORAL | Status: DC

## 2013-06-02 MED ORDER — OXYCODONE HCL 5 MG PO TABS
5.0000 mg | ORAL_TABLET | Freq: Four times a day (QID) | ORAL | Status: DC | PRN
  Administered 2013-06-02 – 2013-06-12 (×25): 5 mg via ORAL
  Filled 2013-06-02 (×25): qty 1

## 2013-06-02 MED ORDER — ONDANSETRON HCL 4 MG/2ML IJ SOLN: 4.0000 mg | Freq: Four times a day (QID) | INTRAMUSCULAR | Status: DC | PRN

## 2013-06-02 MED ORDER — TIOTROPIUM BROMIDE MONOHYDRATE 18 MCG IN CAPS
18.0000 ug | ORAL_CAPSULE | Freq: Every day | RESPIRATORY_TRACT | Status: DC
  Administered 2013-06-04 – 2013-06-12 (×9): 18 ug via RESPIRATORY_TRACT
  Filled 2013-06-02 (×2): qty 5

## 2013-06-02 MED ORDER — ACETAMINOPHEN 325 MG PO TABS: 650.0000 mg | ORAL_TABLET | ORAL | Status: DC | PRN

## 2013-06-02 MED ORDER — BUPROPION HCL 75 MG PO TABS
75.0000 mg | ORAL_TABLET | Freq: Two times a day (BID) | ORAL | Status: DC
  Administered 2013-06-02 – 2013-06-12 (×20): 75 mg via ORAL
  Filled 2013-06-02 (×21): qty 1

## 2013-06-02 MED ORDER — SODIUM CHLORIDE 0.9 % IJ SOLN
3.0000 mL | Freq: Two times a day (BID) | INTRAMUSCULAR | Status: DC
  Administered 2013-06-02 – 2013-06-08 (×7): 3 mL via INTRAVENOUS

## 2013-06-02 NOTE — ED Provider Notes (Signed)
CSN: [id number]     Arrival date & time 06/02/13  1224 History   First MD Initiated Contact with Patient 06/02/13 1248     Chief Complaint  Patient presents with  . Atrial Fibrillation  . Chest Pain   (Consider location/radiation/quality/duration/timing/severity/associated sxs/prior Treatment) HPI  This a 68 year old male with history of hypertension, chronic kidney disease, systolic heart failure, atrial fibrillation who presents with chest pain and dizziness. He also reports shortness of breath and nausea. He states he feels that "my atrial fibrillation is acting up again."  Patient reports onset of symptoms last night. He reports sharp waxing and waning chest pain that is nonradiating.  Currently he is without pain. He states that he is dizzy. He endorses room spinning dizziness. He denies any worsening of this with postural changes. Patient denies any recent fevers or cough.  Of note, patient noted to have a heart rate in the 40s and 50s. I reviewed his chart and he was just recently increased to metoprolol 100 mg.  Past Medical History  Diagnosis Date  . Hypertension   . CKD (chronic kidney disease)     Renal U/S 12/04/2009 showed no pathological findings. Labs 12/04/2009 include normal ESR, C3, C4; neg ANA; SPEP showed nonspecific increase in the alpha-2 region with no M-spike; UPEP showed no monoclonal free light chains; urine IFE showed polyclonal increase in feree Kappa and/or free Lambda light chains. Baseline Cr reported 1.7-2.5.  [name] Chronic systolic heart failure     a. NICM - EF 35% with normal cors 2003. b. Last echo 11/2012 - EF 25-30%.  . Depression   . GERD (gastroesophageal reflux disease)   . Portal vein thrombosis   . Avascular necrosis     right hip s/p replacement  . Gastritis   . Alcohol abuse   . Erectile dysfunction   . Bipolar disorder   . Hydrocele, unspecified   . Intermittent claudication   . Lung nodule     Chest CT scan on 12/14/2008 showed a nodular  opacity at the left lung base felt to most likely represent scarring.  Follow-up chest CT scan on 06/02/2010 showed parenchymal scarring in the left apex, left  lower lobe, and lingula; some of this scarring at the left base had a nodular appearance, unchanged. Chest 09/2012 - stable.  . 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  Thyroid scan 08/28/2009 was normal with no focal areas of abnormal increased or decreased activity seen; the uptake of I 131 sodium iodide at 24 hours was 5.7%.  TSH and free T4 normalized by 08/16/2009.  [name] Pneumonia   . Intestinal obstruction   . COPD (chronic obstructive pulmonary disease)   . Clostridium difficile colitis   . E coli bacteremia   . Cluster headaches   . DVT (deep venous thrombosis)     He has hypercoagulability with multiple prior DVTs  . Pleural plaque     H/o asbestos exposure. Chest CT on 06/02/2010 showed stable extensive calcified pleural plaques involving the left hemithorax, consistent with asbestos related pleural disease.  . Weight loss     Normal colonoscopy by Dr. Olevia Perches on 11/06/2010.  . Tachycardia-bradycardia syndrome     a. 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 b. St. Jude gen change 2010.  [name] PAF (paroxysmal atrial fibrillation)     Paroxysmal, failed medical therapy with amiodarone but has done very well with multaq  . Osteoarthritis   . Spondylosis   . Chronic low back  pain     "cause of my hip" (03/17/2012)  . 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  . Noncompliance   . Atrial flutter   . Thrombocytopenia    Past Surgical History  Procedure Laterality Date  . Left hip bipolar hemiarthroplasty  09/09/2002    By Dr. Lafayette Dragon  . Pacemaker insertion  02/2003    Dr. Roe Rutherford, [place] pulse generator identity SR model [id number] serial [id number]; gen change by Greggory Brandy  . Total hip  arthroplasty  03/08/2008    right; By Dr. Hiram Comber  . Tee without cardioversion N/A 05/27/2013    Procedure: TRANSESOPHAGEAL ECHOCARDIOGRAM (TEE);  Surgeon: Candee Furbish, MD;  Location: [facility] ENDOSCOPY;  Service: Cardiovascular;  Laterality: N/A;  . Cardioversion N/A 05/27/2013    Procedure: CARDIOVERSION;  Surgeon: Candee Furbish, MD;  Location: [facility] ENDOSCOPY;  Service: Cardiovascular;  Laterality: N/A;  . Cardiac catheterization  2003    Nl cors, EF 35%  . Pacemaker placement  06/17/2008    Lead and generator change w/ St. Jude Medical Tendril ST model [id number] (serial  [id number]).        Family History  Problem Relation Age of Onset  . Hypertension Mother   . Cancer Brother     Unsure of type.  . Asthma Mother   . Coronary artery disease Mother   . Colon cancer Neg Hx   . Lung cancer Neg Hx   . Prostate cancer Neg Hx    History  Substance Use Topics  . Smoking status: Current Every Day Smoker -- 0.50 packs/day for 50 years    Types: Cigarettes  . Smokeless tobacco: Never Used  . Alcohol Use: No     Comment: prior etoh abuse    Review of Systems  Constitutional: Negative.  Negative for fever.  Respiratory: Positive for chest tightness and shortness of breath.   Cardiovascular: Positive for chest pain.  Gastrointestinal: Positive for nausea. Negative for vomiting and abdominal pain.  Genitourinary: Negative.  Negative for dysuria.  Musculoskeletal: Negative for back pain.  Skin: Negative for rash.  Neurological: Positive for dizziness. Negative for headaches.  All other systems reviewed and are negative.    Allergies  Digoxin and related and Penicillins  Home Medications   No current outpatient prescriptions on file. BP 145/85  Pulse 87  Temp(Src) 98.3 F (36.8 C) (Oral)  Resp 20  Ht 6' 3.2" (1.91 m)  Wt 158 lb 11.7 oz (72 kg)  BMI 19.74 kg/m2  SpO2 100% Physical Exam  Nursing note and vitals reviewed. Constitutional: He is oriented to person, place, and time.   Thin, chronically ill-appearing, no acute distress  HENT:  Head: Normocephalic and atraumatic.  Mucous membranes dry  Eyes: Pupils are equal, round, and reactive to light.  Neck: Neck supple.  Cardiovascular: Normal heart sounds.   No murmur heard. Bradycardia  Pulmonary/Chest: Effort normal and breath sounds normal. No respiratory distress. He has no wheezes.  Abdominal: Soft. Bowel sounds are normal. There is no tenderness. There is no rebound.  Musculoskeletal: He exhibits no edema.  Lymphadenopathy:    He has no cervical adenopathy.  Neurological: He is alert and oriented to person, place, and time.  Skin: Skin is warm and dry.  Psychiatric: He has a normal mood and affect.    ED Course  Procedures (including critical care time) Labs Review Labs Reviewed  PRO B  NATRIURETIC PEPTIDE - Abnormal; Notable for the following:    Pro B Natriuretic peptide (BNP) 3125.0 (*)    All other components within normal limits  CBC - Abnormal; Notable for the following:    WBC 13.1 (*)    RDW 15.8 (*)    All other components within normal limits  COMPREHENSIVE METABOLIC PANEL - Abnormal; Notable for the following:    Potassium 5.4 (*)    Glucose, Bld 116 (*)    BUN 34 (*)    Creatinine, Ser 1.71 (*)    GFR calc non Af Amer 39 (*)    GFR calc Af Amer 45 (*)    All other components within normal limits  PROTIME-INR - Abnormal; Notable for the following:    Prothrombin Time 30.2 (*)    INR 3.02 (*)    All other components within normal limits  APTT  URINALYSIS, ROUTINE W REFLEX MICROSCOPIC  PROTIME-INR  MAGNESIUM  BASIC METABOLIC PANEL  BASIC METABOLIC PANEL  POCT I-STAT TROPONIN I   Imaging Review Dg Chest 2 View  06/02/2013   CLINICAL DATA:  Chest pain and shortness of breath  EXAM: CHEST  2 VIEW  COMPARISON:  May 27, 2013  FINDINGS: There is a nodular opacity at the left base which may represent a nipple shadow. There is scarring in calcification on the left consistent with  prior empyema. There is no frank edema or consolidation. There is underlying emphysematous change. Right lung is clear. Heart is upper normal in size. The pulmonary vascularity is stable and reflects underlying emphysema. No adenopathy. Pacemaker leads are attached to the right ventricle. No bone lesions.  IMPRESSION: Scarring with evidence of prior empyema on the left. Underlying emphysema. No edema or consolidation.  Suspect nipple shadow left base; advise repeat study with nipple markers to confirm that this nodular opacity indeed represents a nipple shadow as opposed to a nodular lesion within the lung parenchyma.   Electronically Signed   By: Lowella Grip M.D.   On: 06/02/2013 15:06   Ct Chest Wo Contrast  06/02/2013   CLINICAL DATA:  Chest pain, shortness of breath  EXAM: CT CHEST WITHOUT CONTRAST  TECHNIQUE: Multidetector CT imaging of the chest was performed following the standard protocol without IV contrast.  COMPARISON:  Prior chest x-ray obtained earlier today; prior CT scan of the chest 10/12/2012 and 06/02/2010  FINDINGS: Mediastinum: Unremarkable CT appearance of the thyroid gland. No suspicious mediastinal or hilar adenopathy. No soft tissue mediastinal mass. The thoracic esophagus is unremarkable.  Heart/Vascular: Right subclavian approach cardiac rhythm maintenance device. Two leads terminate in the right ventricular apex. Cardiomegaly with left heart enlargement. No pericardial effusion. Atherosclerotic calcifications noted in the coronary arteries. Stable mild aneurysmal dilatation of the ascending thoracic aorta with a maximal transverse diameter of 4.5 cm. No suggestion of acute intramural hematoma. Conventional 3 vessel arch anatomy appeared  Lungs/Pleura: Extensive bilateral left worse than right calcified pleural plaques. No active pleural effusion. Background of moderately severe predominantly centrilobular emphysema. Stable 3 mm pulmonary nodule in the periphery of the right upper  lobe inferiorly. A small centrally cavitary nodular opacity in the right middle lobe is also remains stable at 2 years consistent with a benign and likely postinfectious process. Chronic pleural parenchymal scarring throughout the left lung demonstrates no significant interval change over multiple prior studies dating back at least 2 years. Less prominent pleural parenchymal scarring noted in the right lower lobe.  Bones/Soft Tissues: No acute fracture or aggressive appearing  lytic or blastic osseous lesion.  Upper Abdomen: Limited in the absence of intravenous contrast. No acute abnormality.  IMPRESSION: 1. No acute cardiopulmonary process. 2. Stable appearance of the lungs with left worse than right calcified pleural plaques and extensive pleural parenchymal scarring superimposed on a background of COPD and emphysema. 3. Atherosclerosis including coronary artery disease. 4. Cardiomegaly with left heart enlargement. 5. 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   Electronically Signed   By: [name] M.D.   On: 06/02/2013 17:18    EKG Interpretation    Date/Time:  Wednesday June 02 2013 12:27:24 EST Ventricular Rate:  64 PR Interval:    QRS Duration: 98 QT Interval:  444 QTC Calculation: 458 R Axis:   77 Text Interpretation:  Atrial flutter with variable A-V block with occasional ventricular-paced complexes Nonspecific ST and T wave abnormality Abnormal ECG Slower heartrate than on prior EKG Confirmed by Pablo Mathurin  MD, Ovide Dusek ([id number]) on 06/02/2013 12:49:22 PM            MDM   1. Atrial fibrillation    Patient with multiple complaints.  Patient in Afib with a rate in the 50's.  Recently seen by Dr. Rayann Heman with similar complaints but at that time was in a rate of 110's and metoprolol increased. Work-up initiated and reassuring.  COnsulted cardiology regarding.  Patient to be admitted.   Merryl Hacker, MD 06/02/13 2011

## 2013-06-02 NOTE — Progress Notes (Addendum)
Pharmacy Consult for Dofetilide (Tikosyn) Iniation  Admit Complaint: 68 y.o. male admitted 06/02/2013 with permanent atrial fibrillation to be initiated on dofetilide.   Assessment:  Patient Exclusion Criteria: If any screening criteria checked as "Yes", then  patient  should NOT receive dofetilide until criteria item is corrected. If "Yes" please indicate correction plan.  YES  NO Patient  Exclusion Criteria Correction Plan  []  [x]  Baseline QTc interval is greater than or equal to 440 msec. IF above YES box checked dofetilide contraindicated unless patient has ICD; then may proceed if QTc 500-550 msec or with known ventricular conduction abnormalities may proceed with QTc 550-600 msec. QTc = 458   []  [x]  Magnesium level is less than 1.8 mEq/l : Last magnesium:  Lab Results  Component Value Date   MG 1.8 05/28/2013         []  [x]  Potassium level is less than 4 mEq/l : Last potassium:  Lab Results  Component Value Date   K 5.4* 06/02/2013         []  [x]  Patient is known or suspected to have a digoxin level greater than 2 ng/ml: Lab Results  Component Value Date   DIGOXIN <0.2* 09/23/2008      []  [x]  Creatinine clearance less than 20 ml/min (calculated using Cockcroft-Gault, actual body weight and serum creatinine): The CrCl is unknown because both a height and weight (above a minimum accepted value) are required for this calculation.    []  [x]  Patient has received drugs known to prolong the QT intervals within the last 48 hours(phenothiazines, tricyclics or tetracyclic antidepressants, erythromycin, H-1 antihistamines, cisapride, fluoroquinolones, azithromycin). Drugs not listed above may have an, as yet, undetected potential to prolong the QT interval, updated information on QT prolonging agents is available at this website:QT prolonging agents   []  [x]  Patient received a dose of hydrochlorothiazide (Oretic) alone or in any combination including triamterene (Dyazide, Maxzide) in the  last 48 hours.   []  [x]  Patient received a medication known to increase dofetilide plasma concentrations prior to initial dofetilide dose:    Trimethoprim (Primsol, Proloprim) in the last 36 hours   Verapamil (Calan, Verelan) in the last 36 hours or a sustained release dose in the last 72 hours   Megestrol (Megace) in the last 5 days    Cimetidine (Tagamet) in the last 6 hours   Ketoconazole (Nizoral) in the last 24 hours   Itraconazole (Sporanox) in the last 48 hours    Prochlorperazine (Compazine) in the last 36 hours    []  [x]  Patient is known to have a history of torsades de pointes; congenital or acquired long QT syndromes.   []  [x]  Patient has received a Class 1 antiarrhythmic with less than 2 half-lives since last dose. (Disopyramide, Quinidine, Procainamide, Lidocaine, Mexiletine, Flecainide, Propafenone)   []  [x]  Patient has received amiodarone therapy in the past 3 months or amiodarone level is greater than 0.3 ng/ml.    Patient has been appropriately anticoagulated with warfarin and INR is 3.  Ordering provider was confirmed at [email or web address] if they are not listed on the [facility] Prescribers list.  Goal of Therapy:  Follow renal function, electrolytes, potential drug interactions, and dose adjustment. Provide education and 1 week supply at discharge.  Plan:  1.  Initiate dofetilide based on renal function: Select One Calculated CrCl  Dose q12h  []  > 60 ml/min 500 mcg  [x]  40-60 ml/min 250 mcg  []  20-40 ml/min 125 mcg   2. Follow up QTc after  the first 5 doses, renal function, electrolytes (K & Mg) daily x 3 days, dose adjustment, success of initiation and facilitate 1 week discharge supply as clinically indicated.  3. Initiate Tikosyn education video (Call [phone] and ask for video # [phone]).  4. Place Enrollment Form on the chart for discharge supply of dofetilide.   Georgina Peer 4:56 PM 06/02/2013

## 2013-06-02 NOTE — H&P (Signed)
CARDIOLOGY HISTORY AND PHYSICAL   Patient ID: KALETH KOY Sr. MRN: [id number] DOB/AGE: Oct 10, 1945 68 y.o.  Admit date: 06/02/2013  Primary Physician   Axel Filler, MD Primary Cardiologist   [place]  Reason for Consultation   Atrial fib  [facility] F Ruhan Borak. is a 68 y.o. male with a history of atrial fibrillation. He was hospitalized 1/3-1/12 for problems including atrial fibrillation. He had a TEE/cardioversion but it failed, so at discharge, Multaq was discontinued. He was discharged on metoprolol 100 mg daily and Cardizem CD 360 mg daily.  Per Dr. Jackalyn Lombard note on 1/9: Will plan on a rate control strategy going forward as he really does not have good antiarrhythmic options   He was seen in the office by Dr. Rayann Heman after discharge on 1/12 complaining of symptomatic atrial fibrillation including mild chest tightness and shortness of breath. His metoprolol was increased to 100 mg twice a day and he had diltiazem 30 mg when necessary added to his medication regimen.  For the last 2 days he has felt terrible. He complains of weakness, dizziness and dyspnea on exertion. Today he got tired of these feelings and also felt like he was in some danger of falling, so he came back to the emergency room. In the emergency room, his heart rate and blood pressure are within normal limits but he is still symptomatic.   Past Medical History  Diagnosis Date  . Hypertension   . CKD (chronic kidney disease)     Renal U/S 12/04/2009 showed no pathological findings. Labs 12/04/2009 include normal ESR, C3, C4; neg ANA; SPEP showed nonspecific increase in the alpha-2 region with no M-spike; UPEP showed no monoclonal free light chains; urine IFE showed polyclonal increase in feree Kappa and/or free Lambda light chains. Baseline Cr reported 1.7-2.5.  [name] Chronic systolic heart failure     a. NICM - EF 35% with normal cors 2003. b. Last echo 11/2012 - EF 25-30%.  . Depression   . GERD (gastroesophageal  reflux disease)   . Portal vein thrombosis   . Avascular necrosis     right hip s/p replacement  . Gastritis   . Alcohol abuse   . Erectile dysfunction   . Bipolar disorder   . Hydrocele, unspecified   . Intermittent claudication   . Lung nodule     Chest CT scan on 12/14/2008 showed a nodular opacity at the left lung base felt to most likely represent scarring.  Follow-up chest CT scan on 06/02/2010 showed parenchymal scarring in the left apex, left  lower lobe, and lingula; some of this scarring at the left base had a nodular appearance, unchanged. Chest 09/2012 - stable.  . 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  Thyroid scan 08/28/2009 was normal with no focal areas of abnormal increased or decreased activity seen; the uptake of I 131 sodium iodide at 24 hours was 5.7%.  TSH and free T4 normalized by 08/16/2009.  [name] Pneumonia   . Intestinal obstruction   . COPD (chronic obstructive pulmonary disease)   . Clostridium difficile colitis   . E coli bacteremia   . Cluster headaches   . DVT (deep venous thrombosis)     He has hypercoagulability with multiple prior DVTs  . Pleural plaque     H/o asbestos exposure. Chest CT on 06/02/2010 showed stable extensive calcified pleural plaques involving the left hemithorax, consistent with asbestos related pleural disease.  . Weight loss  Normal colonoscopy by Dr. Olevia Perches on 11/06/2010.  . Tachycardia-bradycardia syndrome     a. 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 b. St. Jude gen change 2010.  [name] PAF (paroxysmal atrial fibrillation)     Paroxysmal, failed medical therapy with amiodarone but has done very well with multaq  . Osteoarthritis   . Spondylosis   . Chronic low back pain     "cause of my hip" (03/17/2012)  . 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  . Noncompliance   . Atrial flutter   .  Thrombocytopenia     Past Surgical History  Procedure Laterality Date  . Left hip bipolar hemiarthroplasty  09/09/2002    By Dr. Lafayette Dragon  . Pacemaker insertion  02/2003    Dr. Roe Rutherford, [place] pulse generator identity SR model [id number] serial [id number]; gen change by Greggory Brandy  . Total hip arthroplasty  03/08/2008    right; By Dr. Hiram Comber  . Tee without cardioversion N/A 05/27/2013    Procedure: TRANSESOPHAGEAL ECHOCARDIOGRAM (TEE);  Surgeon: Candee Furbish, MD;  Location[facility] ENDOSCOPY;  Service: Cardiovascular;  Laterality: N/A;  . Cardioversion N/A 05/27/2013    Procedure: CARDIOVERSION;  Surgeon: Candee Furbish, MD;  Location: [facility] ENDOSCOPY;  Service: Cardiovascular;  Laterality: N/A;  . Cardiac catheterization  2003    Nl cors, EF 35%  . Pacemaker placement  06/17/2008    Lead and generator change w/ St. Jude Medical Tendril ST model [id number] (serial  [id number]).         Allergies  Allergen Reactions  . Digoxin And Related     Dig toxicity 2009  . Penicillins Rash    I have reviewed the patient's current medications Prior to Admission medications   Medication Sig Start Date End Date Taking? Authorizing Provider  albuterol (PROVENTIL HFA;VENTOLIN HFA) 108 (90 BASE) MCG/ACT inhaler Inhale 2 puffs into the lungs every 6 (six) hours as needed for wheezing or shortness of breath. 04/22/12  Yes Axel Filler, MD  buPROPion (WELLBUTRIN) 75 MG tablet Take 1 tablet (75 mg total) by mouth 2 (two) times daily. 11/25/12  Yes Axel Filler, MD  diltiazem (CARDIZEM CD) 360 MG 24 hr capsule Take 1 capsule (360 mg total) by mouth daily. 05/30/13  Yes Juluis Mire, MD  diltiazem (CARDIZEM) 30 MG tablet Take 30 mg by mouth 4 (four) times daily as needed (for heart racing).   Yes Historical Provider, MD  dronabinol (MARINOL) 2.5 MG capsule Take 1 capsule (2.5 mg total) by mouth 2 (two) times daily before lunch and supper. 05/19/13  Yes Marjan Rabbani, MD  lisinopril (PRINIVIL,ZESTRIL) 5  MG tablet Take 1 tablet (5 mg total) by mouth daily. 12/28/12  Yes Thompson Grayer, MD  metoprolol succinate (TOPROL-XL) 100 MG 24 hr tablet Take 1 tablet (100 mg total) by mouth 2 (two) times daily. Pt took 150 mg this morning around 8:45am (05/31/12) 05/31/13  Yes Thompson Grayer, MD  omeprazole (PRILOSEC) 20 MG capsule Take 40 mg by mouth daily.   Yes Historical Provider, MD  oxyCODONE-acetaminophen (PERCOCET) 10-325 MG per tablet Take 1 tablet by mouth every 6 (six) hours as needed for pain. 03/18/13  Yes Axel Filler, MD  tiotropium (SPIRIVA) 18 MCG inhalation capsule Place 1 capsule (18 mcg total) into inhaler and inhale daily. 05/19/13  Yes Juluis Mire, MD  warfarin (COUMADIN) 5 MG tablet Take 2.5 tablets (12.5 mg total) by  mouth daily. 05/20/13 05/20/14 Yes Juluis Mire, MD     History   Social History  . Marital Status: Widowed    Spouse Name: N/A    Number of Children: 10  . Years of Education: N/A   Occupational History  . RETIRED    Social History Main Topics  . Smoking status: Current Every Day Smoker -- 0.50 packs/day for 50 years    Types: Cigarettes  . Smokeless tobacco: Never Used  . Alcohol Use: No     Comment: prior etoh abuse  . Drug Use: 1.00 per week    Special: Marijuana     Comment: prior marijuana  . Sexual Activity: Yes   Other Topics Concern  . Not on file   Social History Narrative   Works as a Architect part time   Smoker 1 pack last 1.5 days tobacco, smokes marijuana    Denies EtOH x 4 years (on 10/12/12)   12 kids    From [place]   [place] Veteran    12 grade education              Family Status  Relation Status Death Age  . Mother Deceased   . Father Deceased   . Brother Alive    Family History  Problem Relation Age of Onset  . Hypertension Mother   . Cancer Brother     Unsure of type.  . Asthma Mother   . Coronary artery disease Mother   . Colon cancer Neg Hx   . Lung cancer Neg Hx   . Prostate cancer Neg Hx      ROS:  Chronic MS pain, admits to using salt. Does not weigh daily. Full 14 point review of systems complete and found to be negative unless listed above.  Physical Exam: Blood pressure 124/74, pulse 73, temperature 97.4 F (36.3 C), temperature source Oral, resp. rate 13, SpO2 100.00%.  General: Well developed, well nourished, male in no acute distress Head: Eyes PERRLA, No xanthomas.   Normocephalic and atraumatic, oropharynx without edema or exudate. Dentition: poor Lungs: dense rales left base, few on right. Heart: Heart irregular rate and rhythm with S1, S2, soft systolic murmur. pulses are 2+ all 4 extrem.   Neck: No carotid bruits. No lymphadenopathy.  JVD minimal elevation. Abdomen: Bowel sounds present, abdomen soft and non-tender without masses or hernias noted. Msk:  No spine or cva tenderness. No weakness, no joint deformities or effusions. Extremities: No clubbing or cyanosis. No edema.  Neuro: Alert and oriented X 3. No focal deficits noted. Psych:  Good affect, responds appropriately Skin: No rashes or lesions noted.  Labs:   Lab Results  Component Value Date   WBC 13.1* 06/02/2013   HGB 14.7 06/02/2013   HCT 43.6 06/02/2013   MCV 92.4 06/02/2013   PLT 231 06/02/2013    Recent Labs  06/02/13 1232  INR 3.02*     Recent Labs Lab 06/02/13 1232  NA 139  K 5.4*  CL 102  CO2 22  BUN 34*  CREATININE 1.71*  CALCIUM 9.1  PROT 7.1  BILITOT 0.3  ALKPHOS 68  ALT 35  AST 26  GLUCOSE 116*  ALBUMIN 3.5   Magnesium  Date Value Range Status  05/28/2013 1.8  1.5 - 2.5 mg/dL Final    Recent Labs  06/02/13 1300  TROPIPOC 0.00   Pro B Natriuretic peptide (BNP)  Date/Time Value Range Status  06/02/2013 12:32 PM 3125.0* 0 - 125 pg/mL Final  05/22/2013  7:20 PM 9662.0* 0 - 125 pg/mL Final   TSH  Date/Time Value Range Status  05/24/2013  5:55 AM 1.999  0.350 - 4.500 uIU/mL Final     Performed at [facility]     T3, Free  Date/Time Value Range Status  03/13/2009   7:56 PM 3.0  (2.3-4.2) pg/mL Final    Transesophageal Echo: 05/27/2013 Study Conclusions - Left ventricle: The cavity size was normal. Wall thickness was normal. Systolic function was mildly reduced. The estimated ejection fraction was in the range of 45% to 50%. - Aortic valve: No evidence of vegetation. Mild regurgitation. - Aorta: The aorta was mildly dilated (4.04cm ascending root)and mildly calcified. - Mitral valve: No evidence of vegetation. Mild regurgitation. - Left atrium: No evidence of thrombus in the atrial cavity or appendage. No evidence of thrombus in the appendage. There was spontaneous echo contrast ("smoke"). - Right atrium: No evidence of thrombus in the atrial cavity or appendage. - Atrial septum: No defect or patent foramen ovale was identified. Echo contrast study showed no right-to-left atrial level shunt, following an increase in RA pressure induced by provocative maneuvers. - Tricuspid valve: No evidence of vegetation. No evidence of vegetation. - Pulmonic valve: No evidence of vegetation. - Line: A venous pacing wire was visualized in the right atrial cavity and right ventricular cavity, with its tip at the RV apex.  ECG:  06/02/2013 Atrial fib, occasional V pacing Vent. rate 64 BPM PR interval * ms QRS duration 98 ms QT/QTc 444/458 ms P-R-T axes * 77 42  Radiology:  Dg Chest 2 View 06/02/2013   CLINICAL DATA:  Chest pain and shortness of breath  EXAM: CHEST  2 VIEW  COMPARISON:  May 27, 2013  FINDINGS: There is a nodular opacity at the left base which may represent a nipple shadow. There is scarring in calcification on the left consistent with prior empyema. There is no frank edema or consolidation. There is underlying emphysematous change. Right lung is clear. Heart is upper normal in size. The pulmonary vascularity is stable and reflects underlying emphysema. No adenopathy. Pacemaker leads are attached to the right ventricle. No bone lesions.   IMPRESSION: Scarring with evidence of prior empyema on the left. Underlying emphysema. No edema or consolidation.  Suspect nipple shadow left base; advise repeat study with nipple markers to confirm that this nodular opacity indeed represents a nipple shadow as opposed to a nodular lesion within the lung parenchyma.   Electronically Signed   By: Lowella Grip M.D.   On: 06/02/2013 15:06   Thyroid Scan/24 hr uptake 08/28/2009 IMPRESSION: The uptake of I 131 sodium iodide at 24 hours was 5.7%. Normal range is 15 - 35%. Thyroid scan is normal.   ASSESSMENT AND PLAN:   The patient was seen today by Dr. Caryl Comes, the patient evaluated and the data reviewed.  Principal Problem:   Atrial fibrillation - rate OK but he is very symptomatic despite this. Reviewed with MD, start Tikosyn. K+  5.4 01/14, Mg 1.8 01/09. Admit for this.   Active Problems:   TOBACCO ABUSE - says will not smoke again.   HYPERTENSION - OK on current Rx   Long-term (current) use of anticoagulants - coumadin is  therapeutic   Non-ischemic cardiomyopathy - EF 45-50% by TEE on 01/08.   Abnl CXR - check CT for further eval.   Hyperkalemia -   Otherwise, continue home Rx, nutritionist to see.   SignedRosaria Ferries, PA-C 06/02/2013 3:57 PM  Beeper [phone]  Co-Sign MD Discussed with D JA and will begin [id number] Have told pt taht long term atrial fibrillation may be his unfortunate lot

## 2013-06-02 NOTE — Progress Notes (Signed)
ANTICOAGULATION CONSULT NOTE - Initial Consult  Pharmacy Consult for warfarin Indication: atrial fibrillation  Allergies  Allergen Reactions  . Digoxin And Related     Dig toxicity 2009  . Penicillins Rash    Patient Measurements:     Vital Signs: Temp: 97.4 F (36.3 C) (01/14 1230) Temp src: Oral (01/14 1230) BP: 140/79 mmHg (01/14 1600) Pulse Rate: 50 (01/14 1600)  Labs:  Recent Labs  06/02/13 1232  HGB 14.7  HCT 43.6  PLT 231  APTT 35  LABPROT 30.2*  INR 3.02*  CREATININE 1.71*    The CrCl is unknown because both a height and weight (above a minimum accepted value) are required for this calculation.   Medical History: Past Medical History  Diagnosis Date  . Hypertension   . CKD (chronic kidney disease)     Renal U/S 12/04/2009 showed no pathological findings. Labs 12/04/2009 include normal ESR, C3, C4; neg ANA; SPEP showed nonspecific increase in the alpha-2 region with no M-spike; UPEP showed no monoclonal free light chains; urine IFE showed polyclonal increase in feree Kappa and/or free Lambda light chains. Baseline Cr reported 1.7-2.5.  [name] Chronic systolic heart failure     a. NICM - EF 35% with normal cors 2003. b. Last echo 11/2012 - EF 25-30%.  . Depression   . GERD (gastroesophageal reflux disease)   . Portal vein thrombosis   . Avascular necrosis     right hip s/p replacement  . Gastritis   . Alcohol abuse   . Erectile dysfunction   . Bipolar disorder   . Hydrocele, unspecified   . Intermittent claudication   . Lung nodule     Chest CT scan on 12/14/2008 showed a nodular opacity at the left lung base felt to most likely represent scarring.  Follow-up chest CT scan on 06/02/2010 showed parenchymal scarring in the left apex, left  lower lobe, and lingula; some of this scarring at the left base had a nodular appearance, unchanged. Chest 09/2012 - stable.  . 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  Thyroid  scan 08/28/2009 was normal with no focal areas of abnormal increased or decreased activity seen; the uptake of I 131 sodium iodide at 24 hours was 5.7%.  TSH and free T4 normalized by 08/16/2009.  [name] Pneumonia   . Intestinal obstruction   . COPD (chronic obstructive pulmonary disease)   . Clostridium difficile colitis   . E coli bacteremia   . Cluster headaches   . DVT (deep venous thrombosis)     He has hypercoagulability with multiple prior DVTs  . Pleural plaque     H/o asbestos exposure. Chest CT on 06/02/2010 showed stable extensive calcified pleural plaques involving the left hemithorax, consistent with asbestos related pleural disease.  . Weight loss     Normal colonoscopy by Dr. Olevia Perches on 11/06/2010.  . Tachycardia-bradycardia syndrome     a. 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 b. St. Jude gen change 2010.  [name] PAF (paroxysmal atrial fibrillation)     Paroxysmal, failed medical therapy with amiodarone but has done very well with multaq  . Osteoarthritis   . Spondylosis   . Chronic low back pain     "cause of my hip" (03/17/2012)  . 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  . Noncompliance   . Atrial flutter   . Thrombocytopenia  Medications:  Warfarin 12.66m daily   Assessment: 68year old male with permanent afib recently taken off of [facility]. He is on chronic coumadin for his anticoagulation and INR appears to be very labile past on previous records with concerns for noncompliance. His last reported home dose was 12.574mdaily. INR is currently therapeutic at 3.0. Will continue warfarin tonight but give lower dose and assess INR in am. Of note he does also have history of previous DVTs.  Goal of Therapy:  INR 2-3 Monitor platelets by anticoagulation protocol: Yes   Plan:  Warfarin 7.60m38monight Daily INR  FraErin HearingharmD., BCPS Clinical Pharmacist Pager [phone]/2015 4:55 PM

## 2013-06-02 NOTE — ED Notes (Signed)
Pt recently discharged from hospital with atrial fibrillation. States chest pain and heart racing began again this am. Has pacemaker in place. Reports shortness of breath and some nausea.

## 2013-06-03 DIAGNOSIS — I509 Heart failure, unspecified: Secondary | ICD-10-CM

## 2013-06-03 DIAGNOSIS — I5032 Chronic diastolic (congestive) heart failure: Secondary | ICD-10-CM

## 2013-06-03 DIAGNOSIS — Z7901 Long term (current) use of anticoagulants: Secondary | ICD-10-CM

## 2013-06-03 DIAGNOSIS — I5023 Acute on chronic systolic (congestive) heart failure: Secondary | ICD-10-CM

## 2013-06-03 LAB — PROTIME-INR
INR: 3.54 — ABNORMAL HIGH (ref 0.00–1.49)
Prothrombin Time: 34.1 seconds — ABNORMAL HIGH (ref 11.6–15.2)

## 2013-06-03 LAB — MAGNESIUM: Magnesium: 2.1 mg/dL (ref 1.5–2.5)

## 2013-06-03 LAB — BASIC METABOLIC PANEL
BUN: 35 mg/dL — ABNORMAL HIGH (ref 6–23)
CALCIUM: 9.1 mg/dL (ref 8.4–10.5)
CO2: 23 mEq/L (ref 19–32)
Chloride: 105 mEq/L (ref 96–112)
Creatinine, Ser: 1.84 mg/dL — ABNORMAL HIGH (ref 0.50–1.35)
GFR calc non Af Amer: 36 mL/min — ABNORMAL LOW (ref >90)
GFR, EST AFRICAN AMERICAN: 41 mL/min — AB (ref >90)
GLUCOSE: 86 mg/dL (ref 70–99)
POTASSIUM: 6.2 meq/L — AB (ref 3.7–5.3)
SODIUM: 141 meq/L (ref 137–147)

## 2013-06-03 MED ORDER — DIPHENHYDRAMINE HCL 25 MG PO CAPS: 25.0000 mg | ORAL_CAPSULE | Freq: Every evening | ORAL | Status: DC | PRN

## 2013-06-03 MED ORDER — ENSURE COMPLETE PO LIQD
237.0000 mL | Freq: Two times a day (BID) | ORAL | Status: DC
  Administered 2013-06-04 – 2013-06-06 (×6): 237 mL via ORAL

## 2013-06-03 MED ORDER — COUMADIN BOOK
Freq: Once | Status: AC
  Administered 2013-06-03: 14:00:00
  Filled 2013-06-03: qty 1

## 2013-06-03 MED ORDER — NICOTINE 14 MG/24HR TD PT24
14.0000 mg | MEDICATED_PATCH | Freq: Every day | TRANSDERMAL | Status: DC
  Administered 2013-06-03 – 2013-06-12 (×10): 14 mg via TRANSDERMAL
  Filled 2013-06-03 (×10): qty 1

## 2013-06-03 MED ORDER — DIPHENHYDRAMINE HCL 25 MG PO CAPS
25.0000 mg | ORAL_CAPSULE | Freq: Every evening | ORAL | Status: DC | PRN
  Administered 2013-06-03 – 2013-06-05 (×2): 25 mg via ORAL
  Filled 2013-06-03 (×2): qty 1

## 2013-06-03 MED ORDER — SODIUM CHLORIDE 0.9 % IV SOLN
INTRAVENOUS | Status: DC
  Administered 2013-06-03: 100 mL/h via INTRAVENOUS

## 2013-06-03 NOTE — Care Management Note (Unsigned)
    Page 1 of 2   06/04/2013     3:27:13 PM   CARE MANAGEMENT NOTE 06/04/2013  Patient:  [name], [name]   Account Number:  [id number]  Date Initiated:  06/03/2013  Documentation initiated by:  Damonta Cossey  Subjective/Objective Assessment:   PT ADM ON 1/14 FOR TIKOSYN LOADING.  PTA, PT INDEPENDENT, LIVES AT HOME WITH FAMILY.     Action/Plan:   PT ACTIVE WITH [facility] PRIOR TO ADMISSION FOR HHRN AND HHPT. WILL NEED RESUMPTION OF CARE ORDERS PRIOR TO DC.   Anticipated DC Date:  06/07/2013   Anticipated DC Plan:  [place]  CM consult      [facility] Choice  Resumption Of Svcs/PTA Provider   Choice offered to / List presented to:             Status of service:  In process, will continue to follow Medicare Important Message given?   (If response is "NO", the following Medicare IM given date fields will be blank) Date Medicare IM given:   Date Additional Medicare IM given:    Discharge Disposition:    Per UR Regulation:  Reviewed for med. necessity/level of care/duration of stay  If discussed at [place] of Stay Meetings, dates discussed:    Comments:  06/04/13 Keyandre Pileggi,RN,BSN [id number] [facility], PT'S PHARMACY IS NOT ON [place]. PT ASKED ME TO CHECK [facility] AT [place]:  THEY STATE THEY ONLY HAVE #20 TABLETS IN STOCK AT THIS TIME, AND STATE THAT IT MAY TAKE WEEKS TO GET MORE IN.  ASKED PHAMACY TECH IF SHE COULD SEE IF MED COULD BE ORDERED ANY SOONER, AND SHE STATED THAT SHE COULD NOT.  CALLED [facility] [place]--THIS PHARMACY IS ON TIKOSYN REGISTRY AND HAS CURRENT DOSE IN STOCK.  06/03/13 Tyrus Wilms,RN,BSN [id number] CHECKED COVERAGE FOR TIKOSYN; COPAY ESTIMATED TO BE $6 AT [place].  PT WILL NEED ONE WEEK'S SUPPLY OF TIKOSYN FILLED FROM MAIN PHARMACY PRIOR TO DC HOME.  WILL FOLLOW.

## 2013-06-03 NOTE — Progress Notes (Signed)
EKG performed and downloaded into EPIC.  Aflutter 91, QRS 88 ms, QT/QTc 406/499 ms. Pt resting with call bell within reach.  Will continue to monitor. Payton Emerald

## 2013-06-03 NOTE — Plan of Care (Addendum)
Problem: Limited Adherence to Nutrition-Related Recommendations (NB-1.6) Goal: Nutrition education Formal process to instruct or train a patient/client in a skill or to impart knowledge to help patients/clients voluntarily manage or modify food choices and eating behavior to maintain or improve health. Outcome: Completed/Met Date Met:  06/03/13 Nutrition Education Note  Nutritional Management consulted for nutrition education regarding low sodium diet.     Upon dietetic intern visit, patient was resistant to low sodium diet education. Patient reported that he is "68 years old and has been using salt all his life and does not want to stop using it now."  Dietetic intern advised patient on cutting back sodium by not adding salt to foods and trying alternative seasonings such as those listed on the "sodium free seasonings" handout from the Nutrition Care Manual. Patient was also given a handout on "low sodium nutrition therapy" from the Nutrition Care Manual. Patient reported that he would consider trying some of the alternative sodium free seasonings, but requested that salt packets with meals while in the hospital. Patient agreed to taste foods prior to adding additional salt.   Expect poor compliance.  Noted patient changed to a regular diet 1/14 1959.   Nutritional Management available should patient have further questions and or concerns.  Claudell Kyle, Dietetic Intern Pager: [phone]  I agree with the Student-Dietitian note and made appropriate revisions.  Arthur Holms, RD, LDN Pager #: [phone] After-Hours Pager #: [phone]

## 2013-06-03 NOTE — Progress Notes (Addendum)
INITIAL NUTRITION ASSESSMENT  DOCUMENTATION CODES Per approved criteria  -Severe malnutrition in the context of chronic illness -Underweight   INTERVENTION: 1.  Ensure Complete PO BID, each supplement provides 350 kcal and 13 grams of protein 2.  HS snack daily  NUTRITION DIAGNOSIS: Increased nutrient needs related to malnutrition and chronic illness as evidenced by estimated nutrition needs.   Goal: Pt to meet >/= 90% of their estimated nutrition needs   Monitor:  PO & supplemental intake, weight, labs, I/O's  Reason for Assessment: Consult, Malnutrition Screening Tool Report  68 y.o. male  Admitting Dx: Atrial fibrillation  ASSESSMENT: Patient with PMH of atrial fibrillation, HTN, CKD,and chronic systolic heart failure was hospitalized 9/7-9/48 for complications with atrial fibrillation. Patient went to ER on 1/14 with weakness, dizziness, and dyspnea on exertion.   Patient stated that he would like to get 2 trays of food at all 3 meals as his appetite has returned. Patient reported that appetite has been poor at home over the past month due to "not feeling well." He reports that he weighed 160-165 pounds in September and had lost down to 138 pounds in December. Patient agreed to consume Ensure Complete and an HS snack daily.  Patient continues to meet criteria for severe malnutrition in the context of chronic illness as evidenced by 9% weight loss in < 6 months and severe muscle mass loss.  Nutrition Focused Physical Exam:  Subcutaneous Fat:  Orbital Region: mild to moderate depletion Upper Arm Region: severe depletion Thoracic and Lumbar Region: N/A  Muscle:  Temple Region: mild to moderate depeltion Clavicle Bone Region: severe depletion Clavicle and Acromion Bone Region: severe depletion Scapular Bone Region: severe depletion Dorsal Hand: N/A Patellar Region: mild to moderate depletion Anterior Thigh Region: mild to moderate depeltion Posterior Calf Region: mild  to moderate depletion  Edema: absent   Height: Ht Readings from Last 1 Encounters:  06/02/13 6' 3.2" (1.91 m)    Weight: Wt Readings from Last 1 Encounters:  06/03/13 146 lb 14.4 oz (66.633 kg)    Ideal Body Weight: 196 pounds  % Ideal Body Weight: 74%  Wt Readings from Last 10 Encounters:  06/03/13 146 lb 14.4 oz (66.633 kg)  05/31/13 159 lb (72.122 kg)  05/30/13 157 lb 6.5 oz (71.4 kg)  05/30/13 157 lb 6.5 oz (71.4 kg)  05/18/13 138 lb 14.4 oz (63.005 kg)  05/17/13 144 lb 4.8 oz (65.454 kg)  02/05/13 153 lb 11.2 oz (69.718 kg)  01/05/13 161 lb 14.4 oz (73.437 kg)  12/28/12 163 lb (73.936 kg)  11/30/12 158 lb 12.8 oz (72.031 kg)    Usual Body Weight: 160  % Usual Body Weight: 91%  BMI:  Body mass index is 18.27 kg/(m^2).  Estimated Nutritional Needs: Kcal: 1800-2000  Protein: 95-105 gm  Fluid: 1.8-2.0 L  Skin: intact  Diet Order: General  EDUCATION NEEDS: -Education needs addressed   Intake/Output Summary (Last 24 hours) at 06/03/13 0947 Last data filed at 06/03/13 0800  Gross per 24 hour  Intake    240 ml  Output   1725 ml  Net  -1485 ml    Last BM: 1/14  Labs:   Recent Labs Lab 05/28/13 0518 06/02/13 1232 06/02/13 1941 06/03/13 0535  NA 140 139 140 141  K 5.3 5.4* 5.3 6.2*  CL 105 102 104 105  CO2 '24 22 25 23  ' BUN 24* 34* 33* 35*  CREATININE 1.49* 1.71* 1.63* 1.84*  CALCIUM 8.8 9.1 8.8 9.1  MG 1.8  --   --  2.1  GLUCOSE 138* 116* 132* 86    CBG (last 3)  No results found for this basename: GLUCAP,  in the last 72 hours  Scheduled Meds: . buPROPion  75 mg Oral BID  . diltiazem  360 mg Oral Daily  . dofetilide  250 mcg Oral Q12H  . dronabinol  2.5 mg Oral BID AC  . magnesium oxide  200 mg Oral Daily  . metoprolol succinate  100 mg Oral BID  . pantoprazole  40 mg Oral Daily  . sodium chloride  3 mL Intravenous Q12H  . sodium chloride  3 mL Intravenous Q12H  . tiotropium  18 mcg Inhalation Daily  . Warfarin - Pharmacist  Dosing Inpatient   Does not apply q1800    Continuous Infusions: . sodium chloride      Past Medical History  Diagnosis Date  . Hypertension   . CKD (chronic kidney disease)     Renal U/S 12/04/2009 showed no pathological findings. Labs 12/04/2009 include normal ESR, C3, C4; neg ANA; SPEP showed nonspecific increase in the alpha-2 region with no M-spike; UPEP showed no monoclonal free light chains; urine IFE showed polyclonal increase in feree Kappa and/or free Lambda light chains. Baseline Cr reported 1.7-2.5.  [name] Chronic systolic heart failure     a. NICM - EF 35% with normal cors 2003. b. Last echo 11/2012 - EF 25-30%.  . Depression   . GERD (gastroesophageal reflux disease)   . Portal vein thrombosis   . Avascular necrosis     right hip s/p replacement  . Gastritis   . Alcohol abuse   . Erectile dysfunction   . Bipolar disorder   . Hydrocele, unspecified   . Intermittent claudication   . Lung nodule     Chest CT scan on 12/14/2008 showed a nodular opacity at the left lung base felt to most likely represent scarring.  Follow-up chest CT scan on 06/02/2010 showed parenchymal scarring in the left apex, left  lower lobe, and lingula; some of this scarring at the left base had a nodular appearance, unchanged. Chest 09/2012 - stable.  . 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  Thyroid scan 08/28/2009 was normal with no focal areas of abnormal increased or decreased activity seen; the uptake of I 131 sodium iodide at 24 hours was 5.7%.  TSH and free T4 normalized by 08/16/2009.  [name] Pneumonia   . Intestinal obstruction   . COPD (chronic obstructive pulmonary disease)   . Clostridium difficile colitis   . E coli bacteremia   . Cluster headaches   . DVT (deep venous thrombosis)     He has hypercoagulability with multiple prior DVTs  . Pleural plaque     H/o asbestos exposure. Chest CT on 06/02/2010 showed stable extensive calcified pleural plaques  involving the left hemithorax, consistent with asbestos related pleural disease.  . Weight loss     Normal colonoscopy by Dr. Olevia Perches on 11/06/2010.  . Tachycardia-bradycardia syndrome     a. 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 b. St. Jude gen change 2010.  [name] PAF (paroxysmal atrial fibrillation)     Paroxysmal, failed medical therapy with amiodarone but has done very well with multaq  . Osteoarthritis   . Spondylosis   . Chronic low back pain     "cause of my hip" (03/17/2012)  . 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  . Noncompliance   . Atrial flutter   . Thrombocytopenia     Past Surgical History  Procedure Laterality Date  . Left hip bipolar hemiarthroplasty  09/09/2002    By Dr. Lafayette Dragon  . Pacemaker insertion  02/2003    Dr. Roe Rutherford, [place] pulse generator identity SR model [id number] serial [id number]; gen change by Greggory Brandy  . Total hip arthroplasty  03/08/2008    right; By Dr. Hiram Comber  . Tee without cardioversion N/A 05/27/2013    Procedure: TRANSESOPHAGEAL ECHOCARDIOGRAM (TEE);  Surgeon: Candee Furbish, MD;  Location: [facility] ENDOSCOPY;  Service: Cardiovascular;  Laterality: N/A;  . Cardioversion N/A 05/27/2013    Procedure: CARDIOVERSION;  Surgeon: Candee Furbish, MD;  Location[facility] ENDOSCOPY;  Service: Cardiovascular;  Laterality: N/A;  . Cardiac catheterization  2003    Nl cors, EF 35%  . Pacemaker placement  06/17/2008    Lead and generator change w/ St. Jude Medical Tendril ST model [id number] (serial  [id number]).         Claudell Kyle, Dietetic Intern Pager: [phone]  I agree with the Student-Dietitian note and made appropriate revisions.  Arthur Holms, RD, LDN Pager #: [phone] After-Hours Pager #: [phone]

## 2013-06-03 NOTE — Progress Notes (Signed)
[place] for warfarin Indication: atrial fibrillation  Allergies  Allergen Reactions  . Digoxin And Related     Dig toxicity 2009  . Penicillins Rash    Patient Measurements: Height: 6' 3.2" (191 cm) Weight: 146 lb 14.4 oz (66.633 kg) IBW/kg (Calculated) : 84.95   Vital Signs: Temp: 98.5 F (36.9 C) (01/15 0507) Temp src: Oral (01/15 0507) BP: 118/81 mmHg (01/15 0507) Pulse Rate: 64 (01/15 0507)  Labs:  Recent Labs  06/02/13 1232 06/02/13 1941 06/03/13 0535  HGB 14.7  --   --   HCT 43.6  --   --   PLT 231  --   --   APTT 35  --   --   LABPROT 30.2*  --  34.1*  INR 3.02*  --  3.54*  CREATININE 1.71* 1.63* 1.84*    Estimated Creatinine Clearance: 35.7 ml/min (by C-G formula based on Cr of 1.84).  Assessment: 68 year old male with permanent afib recently taken off of Multaq presents to [facility] with continued symptomatic afib. He is on chronic warfarin and INR appears to be very labile past on previous records with concerns for noncompliance. His last reported home dose was 12.5mg  daily. Of note he does also have history of previous DVTs. INR on admission was 3.0. He received warfarin 7.5mg  po x1 last evening and INR this morning elevated at 3.54. CBC is stable. No bleeding noted.  Goal of Therapy:  INR 2-3 Monitor platelets by anticoagulation protocol: Yes   Plan:  1. Hold warfarin tonight 2. Daily PT/INR 3. Follow for s/s bleeding  Ovie Cornelio D. Romario Tith, PharmD, BCPS Clinical Pharmacist Pager: [phone] 06/03/2013 1:24 PM

## 2013-06-03 NOTE — Progress Notes (Signed)
   SUBJECTIVE: The patient is doing well today.  At this time, he denies chest pain, shortness of breath, or any new concerns.  [name] buPROPion  75 mg Oral BID  . diltiazem  360 mg Oral Daily  . dofetilide  250 mcg Oral Q12H  . dronabinol  2.5 mg Oral BID AC  . lisinopril  5 mg Oral Daily  . magnesium oxide  200 mg Oral Daily  . metoprolol succinate  100 mg Oral BID  . pantoprazole  40 mg Oral Daily  . sodium chloride  3 mL Intravenous Q12H  . sodium chloride  3 mL Intravenous Q12H  . tiotropium  18 mcg Inhalation Daily  . Warfarin - Pharmacist Dosing Inpatient   Does not apply q1800      OBJECTIVE: Physical Exam: Filed Vitals:   06/02/13 1730 06/02/13 1743 06/02/13 2200 06/03/13 0507  BP: 145/85  122/69 118/81  Pulse: 61 87 89 64  Temp: 98.3 F (36.8 C)  98.1 F (36.7 C) 98.5 F (36.9 C)  TempSrc: Oral  Oral Oral  Resp: 20  18 18   Height:      Weight:    146 lb 14.4 oz (66.633 kg)  SpO2: 100%  97% 99%    Intake/Output Summary (Last 24 hours) at 06/03/13 2426 Last data filed at 06/03/13 0800  Gross per 24 hour  Intake    240 ml  Output   1725 ml  Net  -1485 ml    Telemetry reveals rate controlled afib  GEN- The patient is well appearing, alert and oriented x 3 today.   Head- normocephalic, atraumatic Eyes-  Sclera clear, conjunctiva pink Ears- hearing intact Oropharynx- clear Neck- supple, no JVP Lymph- no cervical lymphadenopathy Lungs- Clear to ausculation bilaterally, normal work of breathing Heart- irregular rate and rhythm, no murmurs, rubs or gallops, PMI not laterally displaced GI- soft, NT, ND, + BS Extremities- no clubbing, cyanosis, or edema Skin- no rash or lesion Psych- euthymic mood, full affect Neuro- strength and sensation are intact  LABS: Basic Metabolic Panel:  Recent Labs  06/02/13 1941 06/03/13 0535  NA 140 141  K 5.3 6.2*  CL 104 105  CO2 25 23  GLUCOSE 132* 86  BUN 33* 35*  CREATININE 1.63* 1.84*  CALCIUM 8.8 9.1  MG  --   2.1   Liver Function Tests:  Recent Labs  06/02/13 1232  AST 26  ALT 35  ALKPHOS 68  BILITOT 0.3  PROT 7.1  ALBUMIN 3.5   No results found for this basename: LIPASE, AMYLASE,  in the last 72 hours CBC:  Recent Labs  06/02/13 1232  WBC 13.1*  HGB 14.7  HCT 43.6  MCV 92.4  PLT 231      ASSESSMENT AND PLAN:  Principal Problem:   Atrial fibrillation Active Problems:   TOBACCO ABUSE   HYPERTENSION   Long-term (current) use of anticoagulants   Non-ischemic cardiomyopathy   Hyperkalemia  1. afib I am not optimistic that tikosyn will prove to be effective.  Will follow QT closely with initiation.  Importance of compliance was discussed.  Coumadin per pharmacy  2. Acute on chronic renal failure Gentle hydration Hold lisinopril today  3. Chronic diastolic dysfunction Stable No change required today  4. HTN Stable No change required today     Thompson Grayer, MD 06/03/2013 9:03 AM

## 2013-06-04 DIAGNOSIS — E875 Hyperkalemia: Secondary | ICD-10-CM

## 2013-06-04 LAB — BASIC METABOLIC PANEL
BUN: 31 mg/dL — AB (ref 6–23)
CHLORIDE: 108 meq/L (ref 96–112)
CO2: 21 meq/L (ref 19–32)
CREATININE: 1.64 mg/dL — AB (ref 0.50–1.35)
Calcium: 8.9 mg/dL (ref 8.4–10.5)
GFR calc Af Amer: 48 mL/min — ABNORMAL LOW (ref >90)
GFR calc non Af Amer: 41 mL/min — ABNORMAL LOW (ref >90)
GLUCOSE: 84 mg/dL (ref 70–99)
Potassium: 5.9 mEq/L — ABNORMAL HIGH (ref 3.7–5.3)
Sodium: 139 mEq/L (ref 137–147)

## 2013-06-04 LAB — PROTIME-INR
INR: 3.04 — ABNORMAL HIGH (ref 0.00–1.49)
Prothrombin Time: 30.4 seconds — ABNORMAL HIGH (ref 11.6–15.2)

## 2013-06-04 LAB — MAGNESIUM: Magnesium: 1.8 mg/dL (ref 1.5–2.5)

## 2013-06-04 MED ORDER — MAGNESIUM SULFATE 40 MG/ML IJ SOLN
2.0000 g | Freq: Once | INTRAMUSCULAR | Status: AC
  Administered 2013-06-04: 2 g via INTRAVENOUS
  Filled 2013-06-04: qty 50

## 2013-06-04 MED ORDER — WARFARIN SODIUM 5 MG PO TABS
5.0000 mg | ORAL_TABLET | Freq: Once | ORAL | Status: AC
  Administered 2013-06-04: 5 mg via ORAL
  Filled 2013-06-04: qty 1

## 2013-06-04 NOTE — Progress Notes (Signed)
SUBJECTIVE: The patient is doing well today.  At this time, he denies chest pain, shortness of breath, or any new concerns.  Tikosyn loading started yesterday, reversion to SR last night -SR EKG pending this morning.   CURRENT MEDICATIONS: . buPROPion  75 mg Oral BID  . diltiazem  360 mg Oral Daily  . dofetilide  250 mcg Oral Q12H  . dronabinol  2.5 mg Oral BID AC  . feeding supplement (ENSURE COMPLETE)  237 mL Oral BID BM  . magnesium oxide  200 mg Oral Daily  . metoprolol succinate  100 mg Oral BID  . nicotine  14 mg Transdermal Daily  . pantoprazole  40 mg Oral Daily  . sodium chloride  3 mL Intravenous Q12H  . sodium chloride  3 mL Intravenous Q12H  . tiotropium  18 mcg Inhalation Daily  . Warfarin - Pharmacist Dosing Inpatient   Does not apply q1800   . sodium chloride 100 mL/hr (06/03/13 1220)    OBJECTIVE: Physical Exam: Filed Vitals:   06/03/13 0507 06/03/13 1500 06/03/13 2143 06/04/13 0524  BP: 118/81 115/86 119/83 132/90  Pulse: 64 90 58 70  Temp: 98.5 F (36.9 C) 98.1 F (36.7 C) 98.1 F (36.7 C) 98 F (36.7 C)  TempSrc: Oral Oral Oral Oral  Resp: 18 20 18 18   Height:      Weight: 146 lb 14.4 oz (66.633 kg)   146 lb 14.4 oz (66.633 kg)  SpO2: 99% 99% 99% 100%    Intake/Output Summary (Last 24 hours) at 06/04/13 0655 Last data filed at 06/04/13 9390  Gross per 24 hour  Intake    380 ml  Output   2875 ml  Net  -2495 ml    Telemetry reveals sinus rhythm  GEN- The patient is well appearing, alert and oriented x 3 today.   Head- normocephalic, atraumatic Eyes-  Sclera clear, conjunctiva pink Ears- hearing intact Oropharynx- clear Neck- supple, no JVP Lymph- no cervical lymphadenopathy Lungs- Clear to ausculation bilaterally, normal work of breathing Heart- Regular rate and rhythm, no murmurs, rubs or gallops, PMI not laterally displaced GI- soft, NT, ND, + BS Extremities- no clubbing, cyanosis, or edema Neuro- strength and sensation are  intact  LABS: Basic Metabolic Panel:  Recent Labs  06/03/13 0535 06/04/13 0417  NA 141 139  K 6.2* 5.9*  CL 105 108  CO2 23 21  GLUCOSE 86 84  BUN 35* 31*  CREATININE 1.84* 1.64*  CALCIUM 9.1 8.9  MG 2.1 1.8   Liver Function Tests:  Recent Labs  06/02/13 1232  AST 26  ALT 35  ALKPHOS 68  BILITOT 0.3  PROT 7.1  ALBUMIN 3.5   CBC:  Recent Labs  06/02/13 1232  WBC 13.1*  HGB 14.7  HCT 43.6  MCV 92.4  PLT 231   INR 3.04  RADIOLOGY: Dg Chest 2 View 06/02/2013   CLINICAL DATA:  Chest pain and shortness of breath  EXAM: CHEST  2 VIEW  COMPARISON:  May 27, 2013  FINDINGS: There is a nodular opacity at the left base which may represent a nipple shadow. There is scarring in calcification on the left consistent with prior empyema. There is no frank edema or consolidation. There is underlying emphysematous change. Right lung is clear. Heart is upper normal in size. The pulmonary vascularity is stable and reflects underlying emphysema. No adenopathy. Pacemaker leads are attached to the right ventricle. No bone lesions.  IMPRESSION: Scarring with evidence of prior empyema on  the left. Underlying emphysema. No edema or consolidation.  Suspect nipple shadow left base; advise repeat study with nipple markers to confirm that this nodular opacity indeed represents a nipple shadow as opposed to a nodular lesion within the lung parenchyma.   Electronically Signed   By: Lowella Grip M.D.   On: 06/02/2013 15:06   Dg Chest 2 View 05/27/2013   CLINICAL DATA:  Shortness of breath  EXAM: CHEST  2 VIEW  COMPARISON:  DG CHEST 2 VIEW dated 05/22/2013; DG CHEST 2 VIEW dated 05/17/2013; CT ABD/PELV WO CM dated 10/12/2012  FINDINGS: There is mild right basilar pleural thickening. There is elevation of the left diaphragm. There is increased opacity in the left retrocardiac region concerning for pneumonia. There is chronic scarring involving the left mid lung. Stable cardiomediastinal silhouette. There  is a dual lead cardiac pacer. The osseous structures are unremarkable.  IMPRESSION: Increased airspace opacity in the left retrocardiac region concerning for a left lower lobe pneumonia. Recommend followup radiography in 4-6 weeks, to document complete resolution following adequate medical therapy. If there is not complete resolution, then recommend further evaluation with CT of the chest to exclude underlying pathology.   Electronically Signed   By: Kathreen Devoid   On: 05/27/2013 19:44   ASSESSMENT AND PLAN:  Principal Problem:   Atrial fibrillation Active Problems:   TOBACCO ABUSE   HYPERTENSION   Long-term (current) use of anticoagulants   Non-ischemic cardiomyopathy   Hyperkalemia   1. afib  Maintaining sinus with tikosyn QTc is stable Coumadin per pharmacy   2. Acute on chronic renal failure  Gentle hydration  Hold again lisinopril today  Would not start at discharge.  Follow-up bmet in 1 week and consider starting then ] 3. Chronic diastolic dysfunction  Stable  No change required today   4. HTN  Stable  No change required today  Anticipate discharge tomorrow after am dose of tikosyn Needs bmet, ekg, mg in 1 week Follow-up with me in 4 weeks

## 2013-06-04 NOTE — Clinical Documentation Improvement (Signed)
Possible Clinical Conditions?       Severe Malnutrition        Severe Protein Calorie Malnutrition      Cannot clinically determine  Supporting Information:  BMI 18.27 this admission.  Signs & Symptoms:  Per Nutrition Consult on 06/04/13, "Patient continues to meet criteria for severe malnutrition in the context of chronic illness as evidenced by 9% weight loss in <6 months and severe muscle mass loss."   Treatment:  Per Nutrition Consult, the Registered Dietician recommends "Ensure Complete PO BID, each supplement contains 350 kcal and 13 grams of protein.  HS snack daily."   Thank You,  [name], RN, BSN, [place] Documentation Improvement Specialist HIM department--[place] Office [phone]

## 2013-06-04 NOTE — Discharge Instructions (Addendum)
Please remember to have Tikosyn Rx filled at [facility] on [address] in [place], as your regular pharmacy will not be able to fill this Rx.   Phone #  [phone]  Information on my medicine - Coumadin   (Warfarin)  This medication education was reviewed with me or my healthcare representative as part of my discharge preparation.  The pharmacist that spoke with me during my hospital stay was:  Ksean Vale, [place]  Why was Coumadin prescribed for you? Coumadin was prescribed for you because you have a blood clot or a medical condition that can cause an increased risk of forming blood clots. Blood clots can cause serious health problems by blocking the flow of blood to the heart, lung, or brain. Coumadin can prevent harmful blood clots from forming. As a reminder your indication for Coumadin is:   Stroke Prevention Because Of Atrial Fibrillation  What test will check on my response to Coumadin? While on Coumadin (warfarin) you will need to have an INR test regularly to ensure that your dose is keeping you in the desired range. The INR (international normalized ratio) number is calculated from the result of the laboratory test called prothrombin time (PT).  If an INR APPOINTMENT HAS NOT ALREADY BEEN MADE FOR YOU please schedule an appointment to have this lab work done by your health care provider within 7 days. Your INR goal is usually a number between:  2 to 3 or your provider may give you a more narrow range like 2-2.5.  Ask your health care provider during an office visit what your goal INR is.  What  do you need to  know  About  COUMADIN? Take Coumadin (warfarin) exactly as prescribed by your healthcare provider about the same time each day.  DO NOT stop taking without talking to the doctor who prescribed the medication.  Stopping without other blood clot prevention medication to take the place of Coumadin may increase your risk of developing a new clot or stroke.  Get refills before you run  out.  What do you do if you miss a dose? If you miss a dose, take it as soon as you remember on the same day then continue your regularly scheduled regimen the next day.  Do not take two doses of Coumadin at the same time.  Important Safety Information A possible side effect of Coumadin (Warfarin) is an increased risk of bleeding. You should call your healthcare provider right away if you experience any of the following:   Bleeding from an injury or your nose that does not stop.   Unusual colored urine (red or dark brown) or unusual colored stools (red or black).   Unusual bruising for unknown reasons.   A serious fall or if you hit your head (even if there is no bleeding).  Some foods or medicines interact with Coumadin (warfarin) and might alter your response to warfarin. To help avoid this:   Eat a balanced diet, maintaining a consistent amount of Vitamin K.   Notify your provider about major diet changes you plan to make.   Avoid alcohol or limit your intake to 1 drink for women and 2 drinks for men per day. (1 drink is 5 oz. wine, 12 oz. beer, or 1.5 oz. liquor.)  Make sure that ANY health care provider who prescribes medication for you knows that you are taking Coumadin (warfarin).  Also make sure the healthcare provider who is monitoring your Coumadin knows when you have started a new  medication including herbals and non-prescription products.  Coumadin (Warfarin)  Major Drug Interactions  Increased Warfarin Effect Decreased Warfarin Effect  Alcohol (large quantities) Antibiotics (esp. Septra/Bactrim, Flagyl, Cipro) Amiodarone (Cordarone) Aspirin (ASA) Cimetidine (Tagamet) Megestrol (Megace) NSAIDs (ibuprofen, naproxen, etc.) Piroxicam (Feldene) Propafenone (Rythmol SR) Propranolol (Inderal) Isoniazid (INH) Posaconazole (Noxafil) Barbiturates (Phenobarbital) Carbamazepine (Tegretol) Chlordiazepoxide (Librium) Cholestyramine (Questran) Griseofulvin Oral  Contraceptives Rifampin Sucralfate (Carafate) Vitamin K   Coumadin (Warfarin) Major Herbal Interactions  Increased Warfarin Effect Decreased Warfarin Effect  Garlic Ginseng Ginkgo biloba Coenzyme Q10 Green tea St. Johns wort    Coumadin (Warfarin) FOOD Interactions  Eat a consistent number of servings per week of foods HIGH in Vitamin K (1 serving =  cup)  Collards (cooked, or boiled & drained) Kale (cooked, or boiled & drained) Mustard greens (cooked, or boiled & drained) Parsley *serving size only =  cup Spinach (cooked, or boiled & drained) Swiss chard (cooked, or boiled & drained) Turnip greens (cooked, or boiled & drained)  Eat a consistent number of servings per week of foods MEDIUM-HIGH in Vitamin K (1 serving = 1 cup)  Asparagus (cooked, or boiled & drained) Broccoli (cooked, boiled & drained, or raw & chopped) Brussel sprouts (cooked, or boiled & drained) *serving size only =  cup Lettuce, raw (green leaf, endive, romaine) Spinach, raw Turnip greens, raw & chopped   These websites have more information on Coumadin (warfarin):  FailFactory.se; VeganReport.com.au;

## 2013-06-04 NOTE — Progress Notes (Signed)
EKG shows patient back ib Sinus [place], HR 55  Triton Heidrich R

## 2013-06-04 NOTE — Progress Notes (Signed)
Patient is currently active with [facility] for chronic disease management services.  Patient has been engaged by a [employer].  Our community based plan of care has focused on community resource support.  We assisted the patient by providing ensure at a reduced cost. He has indicated today that he is still not able to afford the cost of the supplements.  We will explore supplement alternatives and nutrition options in an effort to reduce his cost.  He has had three recent readmissions for atrial fibrillation.  He is engaged with [facility] for [facility] health.  [facility] will provide a Geologist, engineering for collaboration with home health.  We will assist with long term monitoring of his symptom management and his ability to executive his acute intervention plan at home in an effort to reduce his readmissions.  Patient will receive a post discharge transition of care call and will be evaluated for monthly home visits for assessments and disease process education.  Patient has verbally consented to plan.  Made Ellan Lambert RN Inpatient Case Manager aware that [facility] following. Of note, [facility] Care Management services does not replace or interfere with any services that are arranged by inpatient case management or social work.  For additional questions or referrals please contact Corliss Blacker BSN RN [facility] Liaison at [phone].

## 2013-06-04 NOTE — Clinical Documentation Improvement (Signed)
Possible Clinical Conditions?   _______CKD Stage I - GFR > OR = 90 _______CKD Stage II - GFR 60-80 _______CKD Stage III - GFR 30-59 _______CKD Stage IV - GFR 15-29 _______CKD Stage V - GFR < 15 _______Cannot Clinically determine   Supporting Information:  In the H&P from 06/02/13, the admitting MD documented Chronic Kidney Disease in the Past Medical History.     Labs: Creatinine 06/02/13  1.71 mg/dL Creatinine 11/25/12  1.74 mg/dL Creatinine 04/22/12 1.47 mg/dL Creatinine 11/19/10  1.74 mg/dL  GFR  06/02/13 39 mL/min GFR  11/25/12  46 mL/min GFR  04/22/12 56 mL/min GFR  11/19/10  48 mL/min   Thank You,  [name], RN, BSN, [place] Documentation Improvement Specialist HIM department--[place] Office [phone]

## 2013-06-04 NOTE — Progress Notes (Signed)
[facility] for warfarin Indication: atrial fibrillation  Allergies  Allergen Reactions  . Digoxin And Related     Dig toxicity 2009  . Penicillins Rash    Patient Measurements: Height: 6' 3.2" (191 cm) Weight: 146 lb 14.4 oz (66.633 kg) IBW/kg (Calculated) : 84.95   Vital Signs: Temp: 98 F (36.7 C) (01/16 0524) Temp src: Oral (01/16 0524) BP: 132/90 mmHg (01/16 0524) Pulse Rate: 70 (01/16 0524)  Labs:  Recent Labs  06/02/13 1232 06/02/13 1941 06/03/13 0535 06/04/13 0417  HGB 14.7  --   --   --   HCT 43.6  --   --   --   PLT 231  --   --   --   APTT 35  --   --   --   LABPROT 30.2*  --  34.1* 30.4*  INR 3.02*  --  3.54* 3.04*  CREATININE 1.71* 1.63* 1.84* 1.64*    Estimated Creatinine Clearance: 40 ml/min (by C-G formula based on Cr of 1.64).  Assessment: 68 year old male with permanent afib recently taken off of Multaq presents to [facility] with continued symptomatic afib. He is on chronic warfarin and INR appears to be very labile past on previous records with concerns for noncompliance. His last reported home dose was 12.5mg  daily.  Last Coumadin Clinic note with Ulice Dash 11/14 Warfarin 7.5mg  daily.   INR  3.0 no bleeding. Last CBC stable.    Goal of Therapy:  INR 2-3 Monitor platelets by anticoagulation protocol: Yes   Plan:  1. warfarin 5mg  tonight 2. Daily PT/INR 3. Follow for s/s bleeding  [name] Pharm.D. CPP, BCPS Clinical Pharmacist [phone] 06/04/2013 11:11 AM

## 2013-06-05 LAB — MAGNESIUM: Magnesium: 2.4 mg/dL (ref 1.5–2.5)

## 2013-06-05 LAB — BASIC METABOLIC PANEL
BUN: 36 mg/dL — AB (ref 6–23)
BUN: 35 mg/dL — AB (ref 6–23)
BUN: 35 mg/dL — AB (ref 6–23)
CALCIUM: 9.7 mg/dL (ref 8.4–10.5)
CALCIUM: 9 mg/dL (ref 8.4–10.5)
CALCIUM: 8.9 mg/dL (ref 8.4–10.5)
CHLORIDE: 109 meq/L (ref 96–112)
CO2: 20 meq/L (ref 19–32)
CO2: 23 meq/L (ref 19–32)
CO2: 20 meq/L (ref 19–32)
CREATININE: 1.76 mg/dL — AB (ref 0.50–1.35)
CREATININE: 1.78 mg/dL — AB (ref 0.50–1.35)
Chloride: 105 mEq/L (ref 96–112)
Chloride: 108 mEq/L (ref 96–112)
Creatinine, Ser: 1.81 mg/dL — ABNORMAL HIGH (ref 0.50–1.35)
GFR calc Af Amer: 44 mL/min — ABNORMAL LOW (ref >90)
GFR calc Af Amer: 43 mL/min — ABNORMAL LOW (ref >90)
GFR calc Af Amer: 42 mL/min — ABNORMAL LOW (ref >90)
GFR calc non Af Amer: 37 mL/min — ABNORMAL LOW (ref >90)
GFR, EST NON AFRICAN AMERICAN: 38 mL/min — AB (ref >90)
GFR, EST NON AFRICAN AMERICAN: 36 mL/min — AB (ref >90)
GLUCOSE: 62 mg/dL — AB (ref 70–99)
GLUCOSE: 81 mg/dL (ref 70–99)
Glucose, Bld: 108 mg/dL — ABNORMAL HIGH (ref 70–99)
Potassium: 5.9 mEq/L — ABNORMAL HIGH (ref 3.7–5.3)
Potassium: 6.6 mEq/L (ref 3.7–5.3)
Potassium: 6.1 mEq/L — ABNORMAL HIGH (ref 3.7–5.3)
Sodium: 142 mEq/L (ref 137–147)
Sodium: 140 mEq/L (ref 137–147)
Sodium: 141 mEq/L (ref 137–147)

## 2013-06-05 LAB — PROTIME-INR
INR: 2.33 — ABNORMAL HIGH (ref 0.00–1.49)
Prothrombin Time: 24.8 seconds — ABNORMAL HIGH (ref 11.6–15.2)

## 2013-06-05 MED ORDER — DOCUSATE SODIUM 100 MG PO CAPS
100.0000 mg | ORAL_CAPSULE | Freq: Two times a day (BID) | ORAL | Status: DC | PRN
  Administered 2013-06-05 – 2013-06-06 (×2): 100 mg via ORAL
  Filled 2013-06-05 (×3): qty 1

## 2013-06-05 MED ORDER — LORAZEPAM 0.5 MG PO TABS
0.5000 mg | ORAL_TABLET | Freq: Once | ORAL | Status: AC
  Administered 2013-06-05: 0.5 mg via ORAL
  Filled 2013-06-05: qty 1

## 2013-06-05 MED ORDER — WARFARIN SODIUM 5 MG PO TABS
5.0000 mg | ORAL_TABLET | Freq: Once | ORAL | Status: AC
  Administered 2013-06-05: 5 mg via ORAL
  Filled 2013-06-05: qty 1

## 2013-06-05 NOTE — Progress Notes (Signed)
[facility] for warfarin Indication: atrial fibrillation  Allergies  Allergen Reactions  . Digoxin And Related     Dig toxicity 2009  . Penicillins Rash    Patient Measurements: Height: 6' 3.2" (191 cm) Weight: 148 lb 9.4 oz (67.4 kg) (a) IBW/kg (Calculated) : 84.95   Vital Signs: Temp: 98.1 F (36.7 C) (01/17 0445) Temp src: Oral (01/17 0445) BP: 118/76 mmHg (01/17 0445) Pulse Rate: 49 (01/17 0445)  Labs:  Recent Labs  06/02/13 1232  06/03/13 0535 06/04/13 0417 06/05/13 0455  HGB 14.7  --   --   --   --   HCT 43.6  --   --   --   --   PLT 231  --   --   --   --   APTT 35  --   --   --   --   LABPROT 30.2*  --  34.1* 30.4* 24.8*  INR 3.02*  --  3.54* 3.04* 2.33*  CREATININE 1.71*  < > 1.84* 1.64* 1.76*  < > = values in this interval not displayed.  Estimated Creatinine Clearance: 37.8 ml/min (by C-G formula based on Cr of 1.76).  Assessment: 68 year old male with permanent afib recently taken off of Multaq presents to [facility] with continued symptomatic afib. He is on chronic warfarin and INR appears to be very labile past on previous records with concerns for noncompliance. His last reported home dose was 12.5mg  daily, however last Coumadin Clinic note with Ulice Dash 11/14 stated he was on warfarin 7.5mg  daily.   INR this morning 2.33. No bleeding noted, last CBC stable.    Goal of Therapy:  INR 2-3 Monitor platelets by anticoagulation protocol: Yes   Plan:  1. warfarin 5mg  tonight 2. Daily PT/INR 3. Follow for s/s bleeding  Letha Mirabal D. Tylie Golonka, PharmD, BCPS Clinical Pharmacist Pager: [phone] 06/05/2013 11:27 AM

## 2013-06-05 NOTE — Progress Notes (Signed)
CRITICAL VALUE ALERT  Critical value received:  Potassium 6.6  Date of notification:  Stephaine Breshears, Arville Lime  Time of notification:  7628  Critical value read back: YES   Nurse who received alert:  Shellia Hartl, Arville Lime   MD notified (1st page):  Dr. Percival Spanish   Time of first page:  1246   MD notified (2nd page):  Time of second page:  Responding MD:  Dr. Percival Spanish  Time MD responded:  [phone]

## 2013-06-05 NOTE — Progress Notes (Signed)
   SUBJECTIVE:  He did not feel well last night.  Woke with palpitations.  However, no significant arrhythmias on tele.    PHYSICAL EXAM Filed Vitals:   06/04/13 0524 06/04/13 1023 06/04/13 2046 06/05/13 0445  BP: 132/90  118/76 118/76  Pulse: 70  53 49  Temp: 98 F (36.7 C)  99 F (37.2 C) 98.1 F (36.7 C)  TempSrc: Oral  Oral Oral  Resp: 18  18 18   Height:      Weight: 146 lb 14.4 oz (66.633 kg)   148 lb 9.4 oz (67.4 kg)  SpO2: 100% 100% 98% 100%   General:  No distress Lungs:  Few basilar crackles Heart:  RRR Abdomen:  Positive bowel sounds, no rebound no guarding Extremities:  No edema   LABS:  Results for orders placed during the hospital encounter of 06/02/13 (from the past 24 hour(s))  MAGNESIUM     Status: None   Collection Time    06/05/13  4:55 AM      Result Value Range   Magnesium 2.4  1.5 - 2.5 mg/dL  BASIC METABOLIC PANEL     Status: Abnormal   Collection Time    06/05/13  4:55 AM      Result Value Range   Sodium 142  137 - 147 mEq/L   Potassium 5.9 (*) 3.7 - 5.3 mEq/L   Chloride 105  96 - 112 mEq/L   CO2 20  19 - 32 mEq/L   Glucose, Bld 62 (*) 70 - 99 mg/dL   BUN 36 (*) 6 - 23 mg/dL   Creatinine, Ser 1.76 (*) 0.50 - 1.35 mg/dL   Calcium 9.7  8.4 - 10.5 mg/dL   GFR calc non Af Amer 38 (*) >90 mL/min   GFR calc Af Amer 44 (*) >90 mL/min  PROTIME-INR     Status: Abnormal   Collection Time    06/05/13  4:55 AM      Result Value Range   Prothrombin Time 24.8 (*) 11.6 - 15.2 seconds   INR 2.33 (*) 0.00 - 1.49    Intake/Output Summary (Last 24 hours) at 06/05/13 0945 Last data filed at 06/04/13 1115  Gross per 24 hour  Intake    240 ml  Output    400 ml  Net   -160 ml    ASSESSMENT AND PLAN:  Atrial fibrillation:  Maintaining NSR on Tikosyn.  The QTc on non paced beats is OK.  OK to go home on current dose of Tikosyn.  However, the potassium is mildly elevated.    I want to repeat this this afternoon before discharge and see if he is  ambulating.  I suspect that he will likely not go home until tomorrow.   HYPERTENSION:  BP OK.    Long-term (current) use of anticoagulants:  Warfarin per pharmacy  Non-ischemic cardiomyopathy:    CKD (Stage III):  Creat up slightly.  Follow BMET this afternoon.     SEVERE MALNUTRITION:  Continue dietary supplements.    Jeneen Rinks [facility] 06/05/2013 9:45 AM

## 2013-06-05 NOTE — Progress Notes (Signed)
Notified Dr. Percival Spanish of patient's K 6.6 and that he did not feel well enough to walk. Dr. Percival Spanish wants another K ordered in 2 hours. Will continue to monitor closely. Glade Nurse, RN

## 2013-06-06 LAB — PROTIME-INR
INR: 2.18 — AB (ref 0.00–1.49)
Prothrombin Time: 23.6 seconds — ABNORMAL HIGH (ref 11.6–15.2)

## 2013-06-06 LAB — BASIC METABOLIC PANEL
BUN: 32 mg/dL — ABNORMAL HIGH (ref 6–23)
BUN: 34 mg/dL — ABNORMAL HIGH (ref 6–23)
CHLORIDE: 108 meq/L (ref 96–112)
CO2: 19 mEq/L (ref 19–32)
CO2: 18 mEq/L — ABNORMAL LOW (ref 19–32)
Calcium: 9 mg/dL (ref 8.4–10.5)
Calcium: 9.1 mg/dL (ref 8.4–10.5)
Chloride: 106 mEq/L (ref 96–112)
Creatinine, Ser: 1.63 mg/dL — ABNORMAL HIGH (ref 0.50–1.35)
Creatinine, Ser: 1.69 mg/dL — ABNORMAL HIGH (ref 0.50–1.35)
GFR calc Af Amer: 48 mL/min — ABNORMAL LOW (ref >90)
GFR calc non Af Amer: 41 mL/min — ABNORMAL LOW (ref >90)
GFR calc non Af Amer: 40 mL/min — ABNORMAL LOW (ref >90)
GFR, EST AFRICAN AMERICAN: 46 mL/min — AB (ref >90)
GLUCOSE: 85 mg/dL (ref 70–99)
Glucose, Bld: 126 mg/dL — ABNORMAL HIGH (ref 70–99)
POTASSIUM: 6.9 meq/L — AB (ref 3.7–5.3)
POTASSIUM: 6.6 meq/L — AB (ref 3.7–5.3)
SODIUM: 139 meq/L (ref 137–147)
Sodium: 139 mEq/L (ref 137–147)

## 2013-06-06 LAB — BLOOD GAS, ARTERIAL
Acid-base deficit: 4.7 mmol/L — ABNORMAL HIGH (ref 0.0–2.0)
BICARBONATE: 19.8 meq/L — AB (ref 20.0–24.0)
Drawn by: 10552
O2 Content: 3 L/min
O2 Saturation: 97.6 %
PH ART: 7.358 (ref 7.350–7.450)
PO2 ART: 94.6 mmHg (ref 80.0–100.0)
Patient temperature: 98.6
TCO2: 20.9 mmol/L (ref 0–100)
pCO2 arterial: 36.2 mmHg (ref 35.0–45.0)

## 2013-06-06 LAB — CBC
HEMATOCRIT: 42.9 % (ref 39.0–52.0)
HEMOGLOBIN: 14 g/dL (ref 13.0–17.0)
MCH: 30.3 pg (ref 26.0–34.0)
MCHC: 32.6 g/dL (ref 30.0–36.0)
MCV: 92.9 fL (ref 78.0–100.0)
Platelets: 180 10*3/uL (ref 150–400)
RBC: 4.62 MIL/uL (ref 4.22–5.81)
RDW: 15.9 % — AB (ref 11.5–15.5)
WBC: 9.4 10*3/uL (ref 4.0–10.5)

## 2013-06-06 MED ORDER — SODIUM POLYSTYRENE SULFONATE 15 GM/60ML PO SUSP
30.0000 g | Freq: Once | ORAL | Status: AC
  Administered 2013-06-06: 30 g via ORAL
  Filled 2013-06-06: qty 120

## 2013-06-06 MED ORDER — SODIUM BICARBONATE 8.4 % IV SOLN
INTRAVENOUS | Status: AC
  Administered 2013-06-06: 22:00:00 via INTRAVENOUS
  Filled 2013-06-06: qty 150

## 2013-06-06 MED ORDER — WARFARIN SODIUM 7.5 MG PO TABS
7.5000 mg | ORAL_TABLET | Freq: Once | ORAL | Status: AC
  Administered 2013-06-06: 7.5 mg via ORAL
  Filled 2013-06-06: qty 1

## 2013-06-06 NOTE — Progress Notes (Signed)
[facility] for warfarin Indication: atrial fibrillation  Allergies  Allergen Reactions  . Digoxin And Related     Dig toxicity 2009  . Penicillins Rash    Patient Measurements: Height: 6' 3.2" (191 cm) Weight: 149 lb 3.2 oz (67.677 kg) IBW/kg (Calculated) : 84.95   Vital Signs: Temp: 97.5 F (36.4 C) (01/18 0433) Temp src: Oral (01/18 0433) BP: 140/69 mmHg (01/18 0433) Pulse Rate: 50 (01/18 0433)  Labs:  Recent Labs  06/04/13 0417 06/05/13 0455 06/05/13 1135 06/05/13 1610 06/06/13 0428 06/06/13 0909  HGB  --   --   --   --  14.0  --   HCT  --   --   --   --  42.9  --   PLT  --   --   --   --  180  --   LABPROT 30.4* 24.8*  --   --  23.6*  --   INR 3.04* 2.33*  --   --  2.18*  --   CREATININE 1.64* 1.76* 1.78* 1.81*  --  1.63*    Estimated Creatinine Clearance: 41 ml/min (by C-G formula based on Cr of 1.63).  Assessment: 68 year old male with permanent afib recently taken off of Multaq presents to [facility] with continued symptomatic afib. He is on chronic warfarin and INR appears to be very labile past on previous records with concerns for noncompliance. His last reported home dose was 12.5mg  daily, however last Coumadin Clinic note with Paulla Dolly 11/14 stated he was on warfarin 7.5mg  daily.   INR this morning decreased to 2.18. No bleeding noted, CBC stable.    Goal of Therapy:  INR 2-3 Monitor platelets by anticoagulation protocol: Yes   Plan:  1. warfarin 7.5mg  tonight 2. Daily PT/INR 3. Follow for s/s bleeding  Clydene Burack D. Hensley Aziz, PharmD, BCPS Clinical Pharmacist Pager: [phone] 06/06/2013 11:36 AM

## 2013-06-06 NOTE — Progress Notes (Signed)
Dr. Percival Spanish asked pharmacy to look at patient's medications for anything that could increase potassium.  None of the patient's medications have hyperkalemia as a side effect, even as a rare or only case-reported effect.  Unsure if patient's diet is drastically different from home. Some foods that contain high amounts of potassium are sweet potatoes, yogurt, broccoli, bananas, pears, and melons.  Please let us know if we can be of additional assistance.  Nikolai Wilczak D. Camie Hauss, PharmD, BCPS Clinical Pharmacist 06/06/2013 4:22 PM

## 2013-06-06 NOTE — Progress Notes (Signed)
Telemetry ringing out SB in the 40's. Patient has pacemaker HR 50 on monitor. Patient not up to walking today again. EKG done QTc 499. K 6.9 this morning after no tourniquet and no fist. Notified Dr. Percival Spanish of the following and he ordered an ABG and a repeat BMP tonight. Will continue to monitor closely. Glade Nurse, RN

## 2013-06-06 NOTE — Progress Notes (Signed)
Patient ambulated in hall with walker before 12 lead EKG done. QTC on EKG after ambulation 499 ms.

## 2013-06-06 NOTE — Progress Notes (Addendum)
Given persistent hyperkalemia and bradycardia, will stop tikosyn.  Kayexalate, bicarb gtt ordered for hyperkalemia.  Repeat EKG.  Diet changed to low potassium.  Continue evaluation for Type IV RTA.

## 2013-06-06 NOTE — Progress Notes (Signed)
SUBJECTIVE:  He has had no weakness.  Walked some yesterday and was weak but better overnight.  No pain   PHYSICAL EXAM Filed Vitals:   06/05/13 0445 06/05/13 1458 06/05/13 2002 06/06/13 0433  BP: 118/76 135/64 139/63 140/69  Pulse: 49 50 50 50  Temp: 98.1 F (36.7 C) 98.3 F (36.8 C) 98 F (36.7 C) 97.5 F (36.4 C)  TempSrc: Oral Oral Oral Oral  Resp: 18 18 20 20   Height:      Weight: 148 lb 9.4 oz (67.4 kg)   149 lb 3.2 oz (67.677 kg)  SpO2: 100% 97% 100% 100%   General:  No distress Lungs:  Clear Heart:  RRR Abdomen:  Positive bowel sounds, no rebound no guarding Extremities:  No edema   LABS:  Results for orders placed during the hospital encounter of 06/02/13 (from the past 24 hour(s))  BASIC METABOLIC PANEL     Status: Abnormal   Collection Time    06/05/13 11:35 AM      Result Value Range   Sodium 140  137 - 147 mEq/L   Potassium 6.6 (*) 3.7 - 5.3 mEq/L   Chloride 109  96 - 112 mEq/L   CO2 23  19 - 32 mEq/L   Glucose, Bld 81  70 - 99 mg/dL   BUN 35 (*) 6 - 23 mg/dL   Creatinine, Ser 1.78 (*) 0.50 - 1.35 mg/dL   Calcium 9.0  8.4 - 10.5 mg/dL   GFR calc non Af Amer 37 (*) >90 mL/min   GFR calc Af Amer 43 (*) >90 mL/min  BASIC METABOLIC PANEL     Status: Abnormal   Collection Time    06/05/13  4:10 PM      Result Value Range   Sodium 141  137 - 147 mEq/L   Potassium 6.1 (*) 3.7 - 5.3 mEq/L   Chloride 108  96 - 112 mEq/L   CO2 20  19 - 32 mEq/L   Glucose, Bld 108 (*) 70 - 99 mg/dL   BUN 35 (*) 6 - 23 mg/dL   Creatinine, Ser 1.81 (*) 0.50 - 1.35 mg/dL   Calcium 8.9  8.4 - 10.5 mg/dL   GFR calc non Af Amer 36 (*) >90 mL/min   GFR calc Af Amer 42 (*) >90 mL/min  PROTIME-INR     Status: Abnormal   Collection Time    06/06/13  4:28 AM      Result Value Range   Prothrombin Time 23.6 (*) 11.6 - 15.2 seconds   INR 2.18 (*) 0.00 - 1.49  CBC     Status: Abnormal   Collection Time    06/06/13  4:28 AM      Result Value Range   WBC 9.4  4.0 - 10.5 K/uL     RBC 4.62  4.22 - 5.81 MIL/uL   Hemoglobin 14.0  13.0 - 17.0 g/dL   HCT 42.9  39.0 - 52.0 %   MCV 92.9  78.0 - 100.0 fL   MCH 30.3  26.0 - 34.0 pg   MCHC 32.6  30.0 - 36.0 g/dL   RDW 15.9 (*) 11.5 - 15.5 %   Platelets 180  150 - 400 K/uL    Intake/Output Summary (Last 24 hours) at 06/06/13 0803 Last data filed at 06/06/13 0500  Gross per 24 hour  Intake    960 ml  Output   2650 ml  Net  -1690 ml    ASSESSMENT AND PLAN:  Atrial fibrillation:  Maintaining NSR on Tikosyn.  The QTc on non paced beats is OK.  OK to go home on current dose of Tikosyn.    HYPERTENSION:  BP OK.    Long-term (current) use of anticoagulants:  Warfarin per pharmacy  Non-ischemic cardiomyopathy:   Euvolemic  CKD (Stage III):  Creat up slightly.  Follow BMET this afternoon.     SEVERE MALNUTRITION:  Continue dietary supplements.   HYPERKALEMIA:  This is confounding an problematic as the treatment of this on Tikosyn would be difficult.  He has some renal insufficiency but the hyperkalemia is out of proportion to this.  I will check an aldosterone level for hypo.  I wonder if this is pseudo given the wide variation in draws done only a few hours apart.  I will ask the lab to draw this AM but we will discuss specifically how to do this without a trauma.     Jeneen Rinks [facility] 06/06/2013 8:03 AM

## 2013-06-07 DIAGNOSIS — E41 Nutritional marasmus: Secondary | ICD-10-CM

## 2013-06-07 DIAGNOSIS — N189 Chronic kidney disease, unspecified: Secondary | ICD-10-CM

## 2013-06-07 DIAGNOSIS — I495 Sick sinus syndrome: Secondary | ICD-10-CM

## 2013-06-07 LAB — BASIC METABOLIC PANEL
BUN: 30 mg/dL — AB (ref 6–23)
CO2: 22 mEq/L (ref 19–32)
Calcium: 8.7 mg/dL (ref 8.4–10.5)
Chloride: 105 mEq/L (ref 96–112)
Creatinine, Ser: 1.58 mg/dL — ABNORMAL HIGH (ref 0.50–1.35)
GFR, EST AFRICAN AMERICAN: 50 mL/min — AB (ref >90)
GFR, EST NON AFRICAN AMERICAN: 43 mL/min — AB (ref >90)
Glucose, Bld: 99 mg/dL (ref 70–99)
POTASSIUM: 5.8 meq/L — AB (ref 3.7–5.3)
SODIUM: 138 meq/L (ref 137–147)

## 2013-06-07 LAB — PROTIME-INR
INR: 1.98 — AB (ref 0.00–1.49)
PROTHROMBIN TIME: 21.9 s — AB (ref 11.6–15.2)

## 2013-06-07 MED ORDER — DOFETILIDE 250 MCG PO CAPS
250.0000 ug | ORAL_CAPSULE | Freq: Two times a day (BID) | ORAL | Status: DC
  Administered 2013-06-07 – 2013-06-08 (×3): 250 ug via ORAL
  Filled 2013-06-07 (×6): qty 1

## 2013-06-07 MED ORDER — SODIUM POLYSTYRENE SULFONATE 15 GM/60ML PO SUSP
30.0000 g | Freq: Once | ORAL | Status: AC
Start: 2013-06-07 — End: 2013-06-07
  Administered 2013-06-07: 30 g via ORAL
  Filled 2013-06-07: qty 120

## 2013-06-07 MED ORDER — WARFARIN SODIUM 10 MG PO TABS
10.0000 mg | ORAL_TABLET | Freq: Once | ORAL | Status: AC
  Administered 2013-06-07: 10 mg via ORAL
  Filled 2013-06-07: qty 1

## 2013-06-07 MED ORDER — DOFETILIDE 250 MCG PO CAPS
250.0000 ug | ORAL_CAPSULE | Freq: Two times a day (BID) | ORAL | Status: DC
  Filled 2013-06-07: qty 1

## 2013-06-07 NOTE — Progress Notes (Signed)
[facility] for warfarin Indication: atrial fibrillation  Allergies  Allergen Reactions  . Digoxin And Related     Dig toxicity 2009  . Penicillins Rash   Labs:  Recent Labs  06/05/13 0455  06/05/13 1610 06/06/13 0428 06/06/13 0909 06/06/13 1913 06/07/13 0307  HGB  --   --   --  14.0  --   --   --   HCT  --   --   --  42.9  --   --   --   PLT  --   --   --  180  --   --   --   LABPROT 24.8*  --   --  23.6*  --   --  21.9*  INR 2.33*  --   --  2.18*  --   --  1.98*  CREATININE 1.76*  < > 1.81*  --  1.63* 1.69*  --   < > = values in this interval not displayed.  Estimated Creatinine Clearance: 39.9 ml/min (by C-G formula based on Cr of 1.69).  Assessment: 68 year old male with permanent afib recently taken off of Multaq presents to [facility] with continued symptomatic afib. He is on chronic warfarin and INR appears to be very labile past on previous records with concerns for noncompliance. His last reported home dose was 12.5mg  daily, however last Coumadin Clinic note with Paulla Dolly 11/14 stated he was on warfarin 7.5mg  daily.   INR this morning decreased to 1.98. No bleeding noted, CBC stable.    Goal of Therapy:  INR 2-3 Monitor platelets by anticoagulation protocol: Yes   Plan:  1. warfarin 10 mg tonight 2. Daily PT/INR 3. Follow for s/s bleeding  Thank you. Anette Guarneri, PharmD [phone]  06/07/2013 8:52 AM

## 2013-06-07 NOTE — Progress Notes (Signed)
SUBJECTIVE:   Doing well today,  No CP or SOB. He is frustrated by low potassium diet  PHYSICAL EXAM Filed Vitals:   06/06/13 2037 06/07/13 0459 06/07/13 0500 06/07/13 1016  BP: 115/68 127/77  130/66  Pulse: 50 48    Temp: 98.1 F (36.7 C) 97.9 F (36.6 C)    TempSrc: Oral Oral    Resp: 18 18    Height:      Weight:   150 lb 8 oz (68.266 kg)   SpO2: 100% 100%     General:  No distress, alert HEENT:  OP clear Neck- supple Lungs:  Clear Heart:  RRR Abdomen:  Positive bowel sounds, no rebound no guarding Extremities:  No edema  Tele- stable QTc (450 msec)  LABS:  Results for orders placed during the hospital encounter of 06/02/13 (from the past 24 hour(s))  BLOOD GAS, ARTERIAL     Status: Abnormal   Collection Time    06/06/13  5:11 PM      Result Value Range   O2 Content 3.0     Delivery systems NASAL CANNULA     pH, Arterial 7.358  7.350 - 7.450   pCO2 arterial 36.2  35.0 - 45.0 mmHg   pO2, Arterial 94.6  80.0 - 100.0 mmHg   Bicarbonate 19.8 (*) 20.0 - 24.0 mEq/L   TCO2 20.9  0 - 100 mmol/L   Acid-base deficit 4.7 (*) 0.0 - 2.0 mmol/L   O2 Saturation 97.6     Patient temperature 98.6     Collection site LEFT RADIAL     Drawn by [id number]     Sample type ARTERIAL DRAW     Allens test (pass/fail) PASS  PASS  BASIC METABOLIC PANEL     Status: Abnormal   Collection Time    06/06/13  7:13 PM      Result Value Range   Sodium 139  137 - 147 mEq/L   Potassium 6.6 (*) 3.7 - 5.3 mEq/L   Chloride 106  96 - 112 mEq/L   CO2 18 (*) 19 - 32 mEq/L   Glucose, Bld 126 (*) 70 - 99 mg/dL   BUN 34 (*) 6 - 23 mg/dL   Creatinine, Ser 1.69 (*) 0.50 - 1.35 mg/dL   Calcium 9.1  8.4 - 10.5 mg/dL   GFR calc non Af Amer 40 (*) >90 mL/min   GFR calc Af Amer 46 (*) >90 mL/min  PROTIME-INR     Status: Abnormal   Collection Time    06/07/13  3:07 AM      Result Value Range   Prothrombin Time 21.9 (*) 11.6 - 15.2 seconds   INR 1.98 (*) 0.00 - 1.49    Intake/Output Summary (Last 24  hours) at 06/07/13 1025 Last data filed at 06/07/13 0730  Gross per 24 hour  Intake    840 ml  Output   2250 ml  Net  -1410 ml    ASSESSMENT AND PLAN:  Atrial fibrillation:  Maintaining NSR on Tikosyn. QT is stable  HYPERTENSION:  stable    Long-term (current) use of anticoagulants:  Warfarin per pharmacy  Non-ischemic cardiomyopathy:   Euvolemic Ace inhibitor on hold due to elevated creatinine  CKD (Stage III):  Creat up slightly.  Follow BMET this morning  SEVERE MALNUTRITION:  Continue dietary supplements.   HYPERKALEMIA:   Awaiting potassium (BMET not ordered for earlier today) Keep on low potassium diet for now Consider nephrology consult if not improved S/p  kayexalate yesterday  Disposition:  Discharge once potassium is improved  [name] 06/07/2013 10:25 AM

## 2013-06-07 NOTE — Progress Notes (Signed)
Pt back in Afib/Aflutter. Notified Dr. Rayann Heman. Pt also not had tikosyn today since dosage was stopped last night by MD. Notified Dr. Rayann Heman of this and re-ordered medication. Will obtain EKG and give dosage for today.

## 2013-06-08 ENCOUNTER — Other Ambulatory Visit: Payer: Medicare HMO

## 2013-06-08 ENCOUNTER — Encounter: Payer: Medicare Other | Admitting: Cardiology

## 2013-06-08 ENCOUNTER — Ambulatory Visit: Payer: Medicare HMO | Admitting: Internal Medicine

## 2013-06-08 LAB — BASIC METABOLIC PANEL
BUN: 32 mg/dL — ABNORMAL HIGH (ref 6–23)
CO2: 21 meq/L (ref 19–32)
Calcium: 8.6 mg/dL (ref 8.4–10.5)
Chloride: 107 mEq/L (ref 96–112)
Creatinine, Ser: 1.67 mg/dL — ABNORMAL HIGH (ref 0.50–1.35)
GFR calc Af Amer: 47 mL/min — ABNORMAL LOW (ref >90)
GFR calc non Af Amer: 40 mL/min — ABNORMAL LOW (ref >90)
Glucose, Bld: 97 mg/dL (ref 70–99)
Potassium: 5.1 mEq/L (ref 3.7–5.3)
Sodium: 140 mEq/L (ref 137–147)

## 2013-06-08 LAB — PROTIME-INR
INR: 1.98 — ABNORMAL HIGH (ref 0.00–1.49)
PROTHROMBIN TIME: 21.9 s — AB (ref 11.6–15.2)

## 2013-06-08 MED ORDER — SODIUM CHLORIDE 0.9 % IJ SOLN
3.0000 mL | INTRAMUSCULAR | Status: DC | PRN
Start: 2013-06-08 — End: 2013-06-12

## 2013-06-08 MED ORDER — SODIUM CHLORIDE 0.9 % IJ SOLN
3.0000 mL | Freq: Two times a day (BID) | INTRAMUSCULAR | Status: DC
  Administered 2013-06-08 – 2013-06-11 (×5): 3 mL via INTRAVENOUS

## 2013-06-08 MED ORDER — WARFARIN SODIUM 10 MG PO TABS
10.0000 mg | ORAL_TABLET | Freq: Once | ORAL | Status: AC
  Administered 2013-06-08: 10 mg via ORAL
  Filled 2013-06-08: qty 1

## 2013-06-08 MED ORDER — ENOXAPARIN SODIUM 80 MG/0.8ML ~~LOC~~ SOLN
1.0000 mg/kg | Freq: Two times a day (BID) | SUBCUTANEOUS | Status: AC
  Administered 2013-06-08 (×2): 70 mg via SUBCUTANEOUS
  Filled 2013-06-08 (×2): qty 0.8

## 2013-06-08 MED ORDER — SODIUM CHLORIDE 0.9 % IV SOLN: 250.0000 mL | INTRAVENOUS | Status: DC

## 2013-06-08 NOTE — Progress Notes (Signed)
SUBJECTIVE: The patient is doing well today.  At this time, he denies chest pain, shortness of breath, or any new concerns.  He has returned to afib yesterday am while his tikosyn was being held for elevated K.  K is better today.  CURRENT MEDICATIONS: . buPROPion  75 mg Oral BID  . diltiazem  360 mg Oral Daily  . dofetilide  250 mcg Oral BID  . dronabinol  2.5 mg Oral BID AC  . feeding supplement (ENSURE COMPLETE)  237 mL Oral BID BM  . magnesium oxide  200 mg Oral Daily  . metoprolol succinate  100 mg Oral BID  . nicotine  14 mg Transdermal Daily  . pantoprazole  40 mg Oral Daily  . sodium chloride  3 mL Intravenous Q12H  . sodium chloride  3 mL Intravenous Q12H  . tiotropium  18 mcg Inhalation Daily  . Warfarin - Pharmacist Dosing Inpatient   Does not apply q1800   . sodium chloride 100 mL/hr (06/03/13 1220)    OBJECTIVE: Physical Exam: Filed Vitals:   06/07/13 1245 06/07/13 2059 06/07/13 2100 06/08/13 0416  BP: 126/68 125/66  111/81  Pulse: 59 65 88 104  Temp: 98 F (36.7 C) 98.4 F (36.9 C)  98.3 F (36.8 C)  TempSrc: Oral Oral  Oral  Resp: 18 17  18   Height:      Weight:    150 lb 5.7 oz (68.2 kg)  SpO2: 100% 100%  97%    Intake/Output Summary (Last 24 hours) at 06/08/13 0708 Last data filed at 06/08/13 0612  Gross per 24 hour  Intake    840 ml  Output   2200 ml  Net  -1360 ml    Telemetry reveals afib, ventricular rates 80-100  GEN- The patient is well appearing, alert and oriented x 3 today.   Head- normocephalic, atraumatic Eyes-  Sclera clear, conjunctiva pink Ears- hearing intact Oropharynx- clear Neck- supple  Lungs- Clear to ausculation bilaterally, normal work of breathing Heart- irregular rate and rhythm  GI- soft, NT, ND, + BS Extremities- no clubbing, cyanosis, or edema Skin- no rash or lesion Psych- euthymic mood, full affect Neuro- strength and sensation are intact  LABS: Basic Metabolic Panel:  Recent Labs  06/07/13 1130  06/08/13 0500  NA 138 140  K 5.8* 5.1  CL 105 107  CO2 22 21  GLUCOSE 99 97  BUN 30* 32*  CREATININE 1.58* 1.67*  CALCIUM 8.7 8.6   CBC:  Recent Labs  06/06/13 0428  WBC 9.4  HGB 14.0  HCT 42.9  MCV 92.9  PLT 180    RADIOLOGY: Dg Chest 2 View 06/02/2013   CLINICAL DATA:  Chest pain and shortness of breath  EXAM: CHEST  2 VIEW  COMPARISON:  May 27, 2013  FINDINGS: There is a nodular opacity at the left base which may represent a nipple shadow. There is scarring in calcification on the left consistent with prior empyema. There is no frank edema or consolidation. There is underlying emphysematous change. Right lung is clear. Heart is upper normal in size. The pulmonary vascularity is stable and reflects underlying emphysema. No adenopathy. Pacemaker leads are attached to the right ventricle. No bone lesions.  IMPRESSION: Scarring with evidence of prior empyema on the left. Underlying emphysema. No edema or consolidation.  Suspect nipple shadow left base; advise repeat study with nipple markers to confirm that this nodular opacity indeed represents a nipple shadow as opposed to a nodular lesion within  the lung parenchyma.   Electronically Signed   By: Lowella Grip M.D.   On: 06/02/2013 15:06   ASSESSMENT AND PLAN:  Principal Problem:   Atrial fibrillation Active Problems:   TOBACCO ABUSE   HYPERTENSION   Long-term (current) use of anticoagulants   Non-ischemic cardiomyopathy   Hyperkalemia  1. afib- went back into afib yesterday while tikosyn was being held for elevated K Tikosyn has been restarted INR is 1.98.  I will therefore bridge with lovenox today.  If he remains in afib tomorrow then he may require cardioversion. Continue coumadin (goal INR 2-3)  2. Hyperkalemia Improved Aldosterone level ordered and pending  3. Chronic renal failure Stable Would consider outpatient nephrology consult  4. HTN Stable No change required today  Hope to discharge once back  in sinus

## 2013-06-08 NOTE — Progress Notes (Signed)
[name] for warfarin Indication: atrial fibrillation  Allergies  Allergen Reactions  . Digoxin And Related     Dig toxicity 2009  . Penicillins Rash    Labs:  Recent Labs  06/06/13 0428  06/06/13 1913 06/07/13 0307 06/07/13 1130 06/08/13 0500  HGB 14.0  --   --   --   --   --   HCT 42.9  --   --   --   --   --   PLT 180  --   --   --   --   --   LABPROT 23.6*  --   --  21.9*  --  21.9*  INR 2.18*  --   --  1.98*  --  1.98*  CREATININE  --   < > 1.69*  --  1.58* 1.67*  < > = values in this interval not displayed.  Estimated Creatinine Clearance: 40.3 ml/min (by C-G formula based on Cr of 1.67).  Assessment: 68 year old male with permanent afib recently taken off of Multaq presents to [name] with continued symptomatic afib. He is on chronic warfarin and INR appears to be very labile past on previous records with concerns for noncompliance. His last reported home dose was 12.5mg  daily, however last [facility] note with Paulla Dolly 11/14 stated he was on warfarin 7.5mg  daily.   INR this morning decreased to 1.98. No bleeding noted, CBC stable. Lovenox x 2 doses added today   Goal of Therapy:  INR 2-3 Monitor platelets by anticoagulation protocol: Yes   Plan:  1. Repeat Warfarin 10 mg tonight 2. Daily PT/INR 3. Follow for s/s bleeding  Thank you. Anette Guarneri, PharmD [phone]  06/08/2013 9:00 AM

## 2013-06-09 ENCOUNTER — Encounter (HOSPITAL_COMMUNITY)
Admission: EM | Disposition: A | Discharge status: Home / Self Care | Payer: Self-pay | Source: Home / Self Care | Attending: Internal Medicine

## 2013-06-09 LAB — BASIC METABOLIC PANEL
BUN: 27 mg/dL — ABNORMAL HIGH (ref 6–23)
CO2: 22 mEq/L (ref 19–32)
CREATININE: 1.73 mg/dL — AB (ref 0.50–1.35)
Calcium: 9.4 mg/dL (ref 8.4–10.5)
Chloride: 105 mEq/L (ref 96–112)
GFR calc non Af Amer: 39 mL/min — ABNORMAL LOW (ref >90)
GFR, EST AFRICAN AMERICAN: 45 mL/min — AB (ref >90)
Glucose, Bld: 99 mg/dL (ref 70–99)
POTASSIUM: 7 meq/L — AB (ref 3.7–5.3)
SODIUM: 139 meq/L (ref 137–147)

## 2013-06-09 LAB — NA AND K (SODIUM & POTASSIUM), RAND UR
Potassium Urine: 52 mEq/L
Sodium, Ur: 144 mEq/L

## 2013-06-09 LAB — PROTIME-INR
INR: 2.69 — ABNORMAL HIGH (ref 0.00–1.49)
PROTHROMBIN TIME: 27.7 s — AB (ref 11.6–15.2)

## 2013-06-09 LAB — ALDOSTERONE: Aldosterone, Serum: <1 ng/dL

## 2013-06-09 IMAGING — CR DG CHEST 2V
2 series · 2 of 2 positions shown · non-contrast
Comparison: 08/14/2011

CLINICAL DATA: Shortness of breath

CHEST - 2 VIEW

[w chest pa]
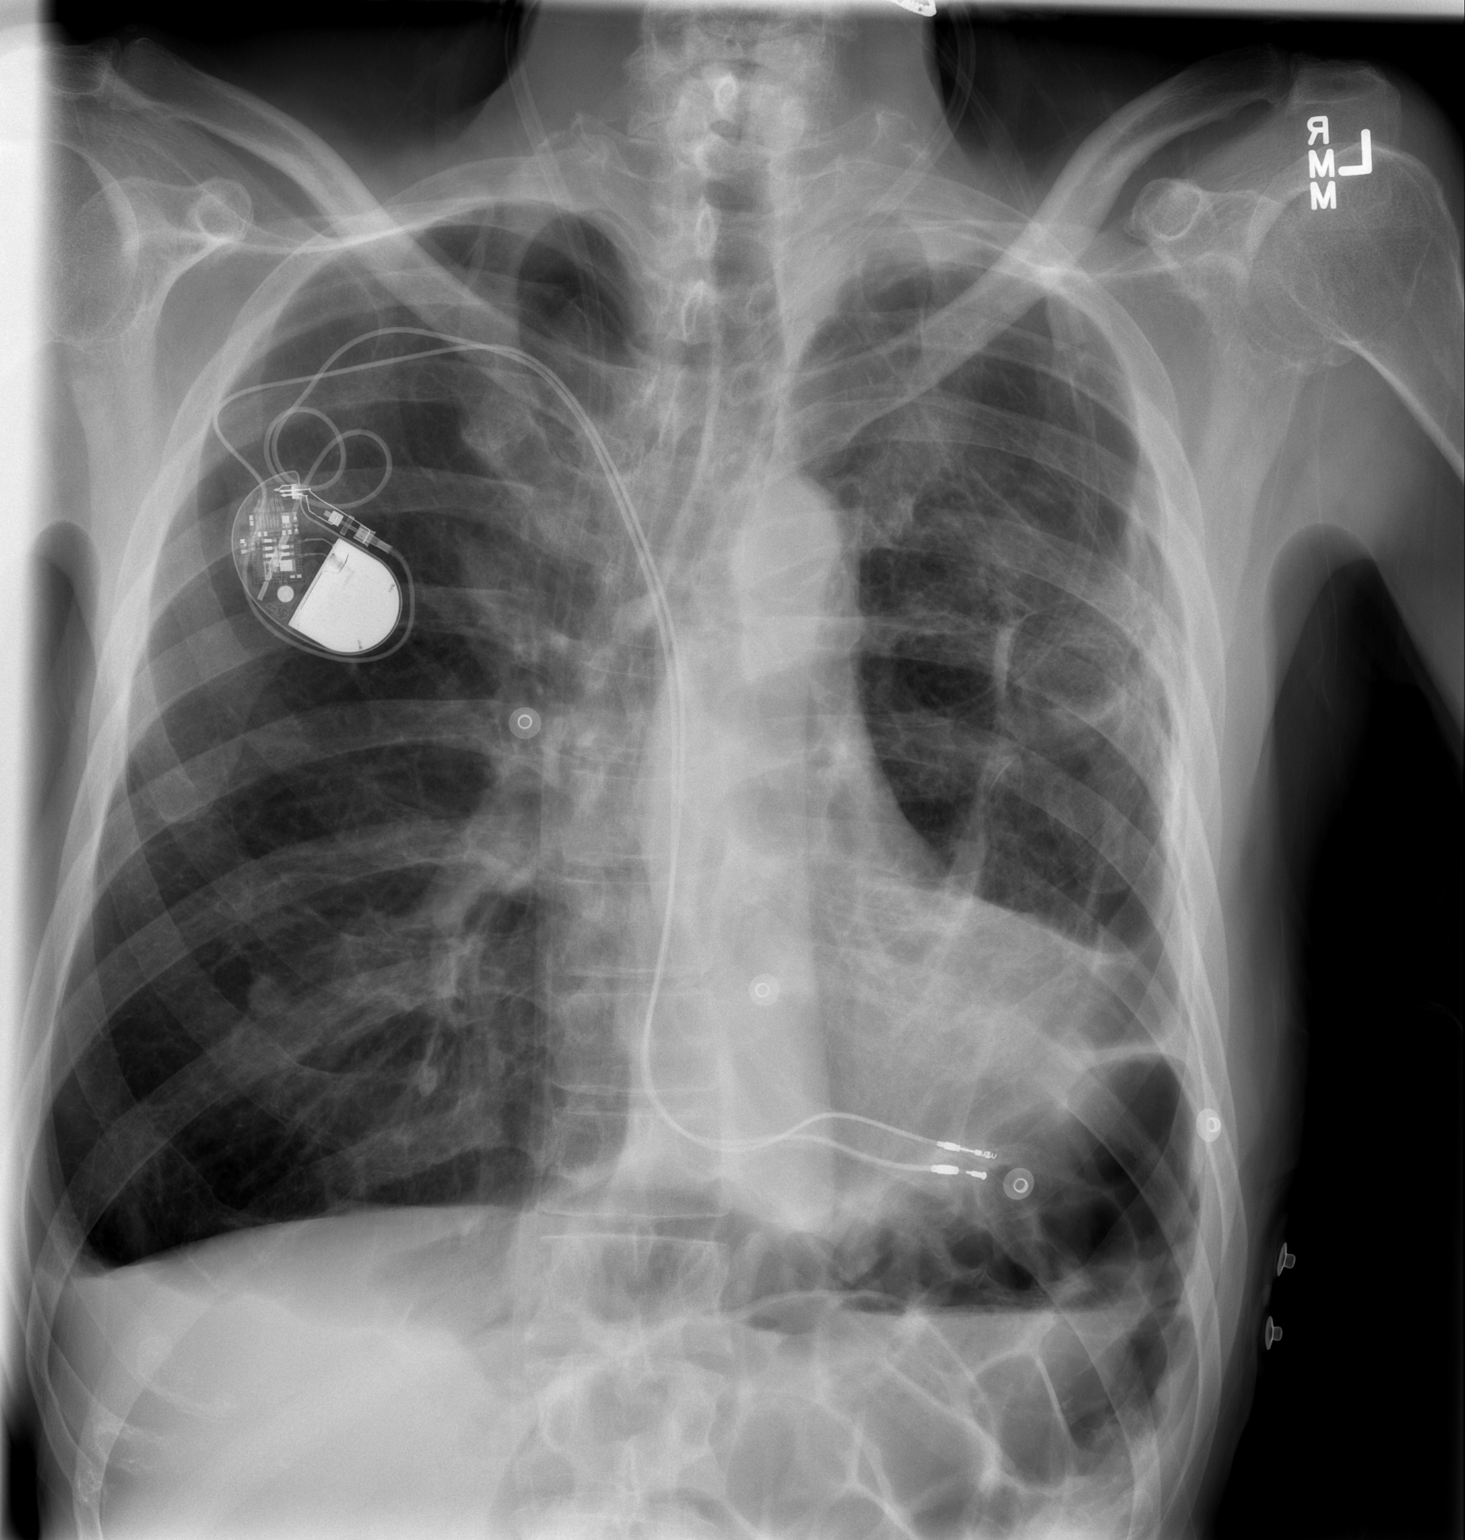

[w chest lat]
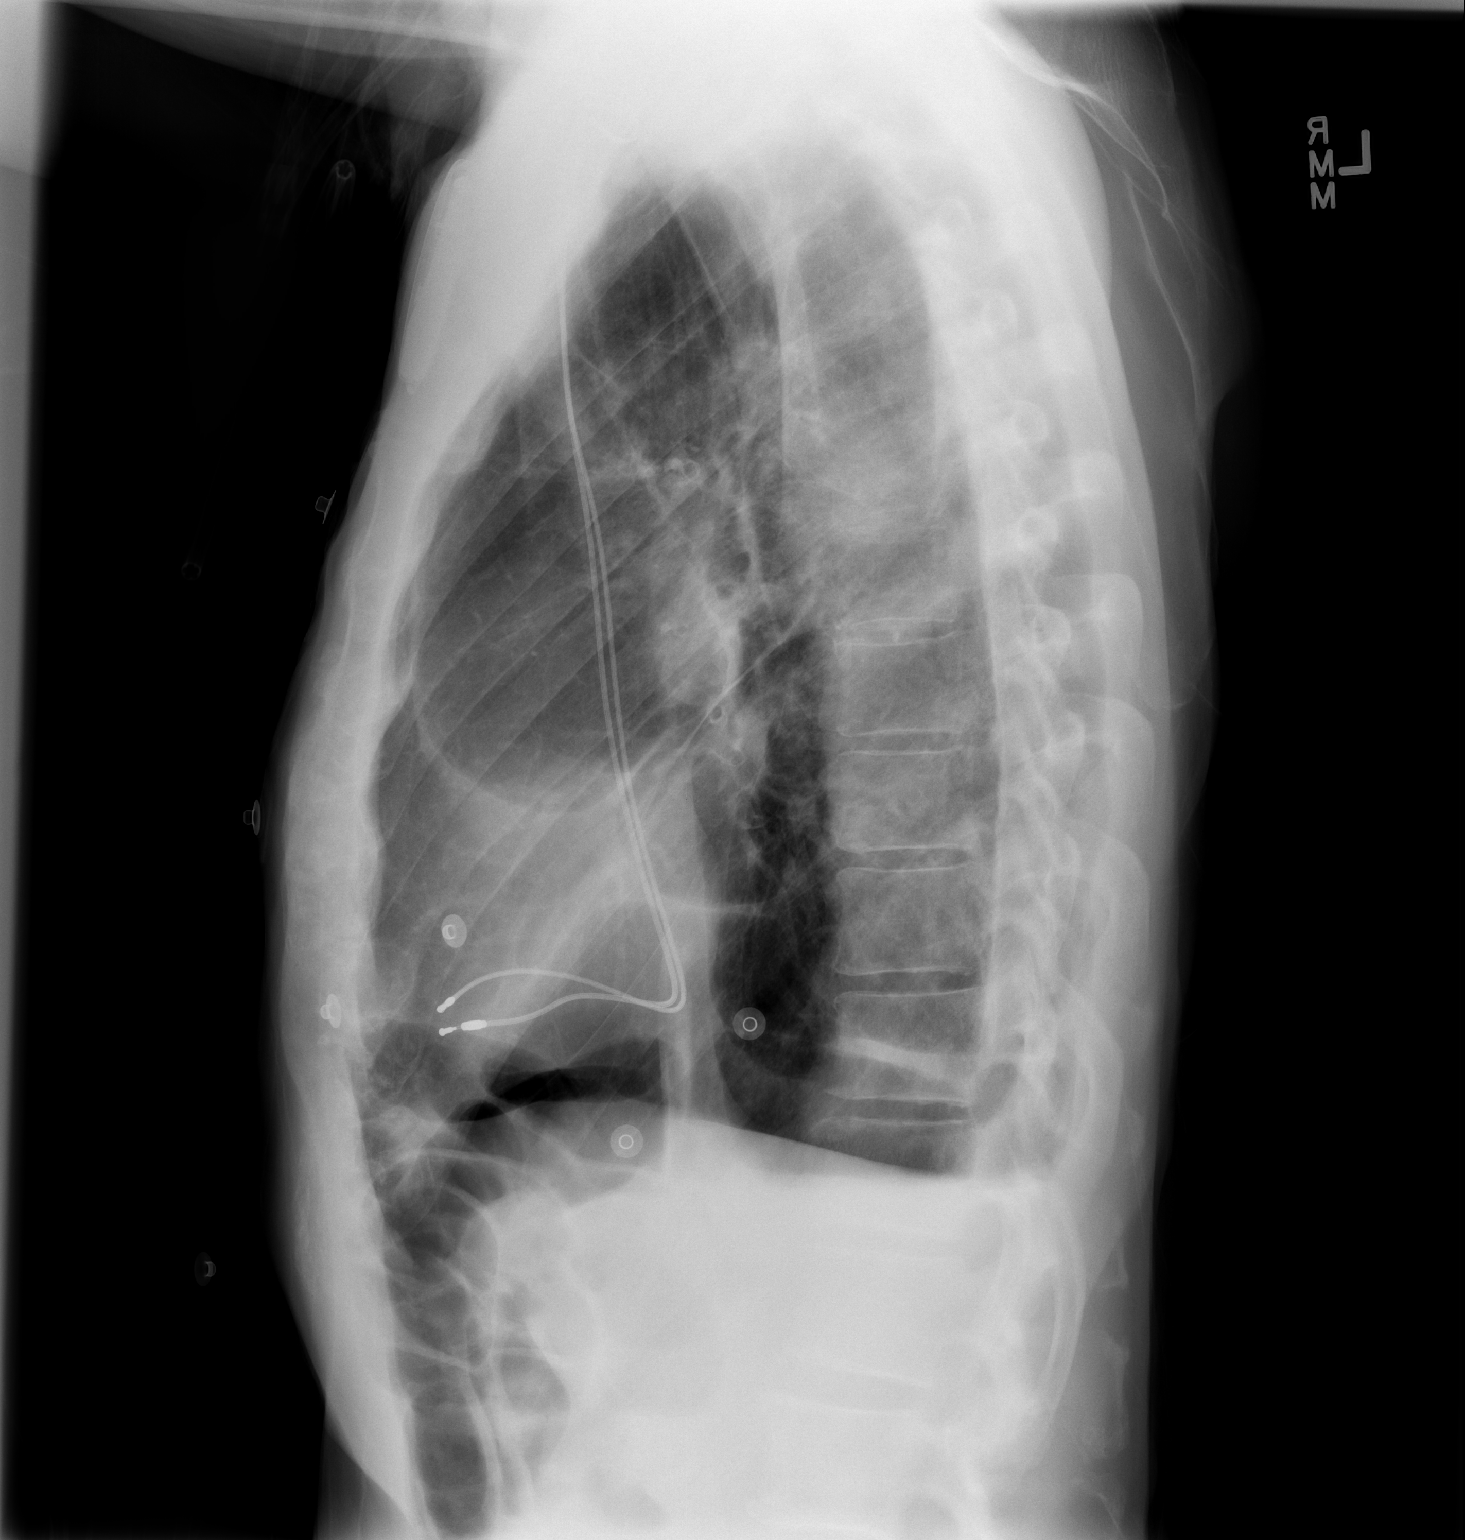

[2 of 2 positions shown; findings below may reference images not displayed]

FINDINGS: Right subclavian dual lead pacemaker stable in position.
Chronic elevation of right left diaphragmatic leaflet and blunting
of the   left costophrenic angle.  Coarse linear parenchymal
opacities in the mid and lower left lung are stable.  Right lung
clear, hyperinflated. Heart size within normal limits for
technique.  Regional bones unremarkable.
IMPRESSION: 1.  Stable left pleural and parenchymal changes.  No acute or
superimposed abnormality.

## 2013-06-09 SURGERY — CARDIOVERSION
Anesthesia: Choice

## 2013-06-09 MED ORDER — SODIUM POLYSTYRENE SULFONATE 15 GM/60ML PO SUSP
45.0000 g | Freq: Once | ORAL | Status: AC
  Administered 2013-06-09: 45 g via ORAL
  Filled 2013-06-09: qty 180

## 2013-06-09 MED ORDER — FUROSEMIDE 40 MG PO TABS: 40.0000 mg | ORAL_TABLET | Freq: Every day | ORAL | Status: DC

## 2013-06-09 MED ORDER — FUROSEMIDE 40 MG PO TABS
40.0000 mg | ORAL_TABLET | Freq: Every day | ORAL | Status: DC
  Administered 2013-06-09 – 2013-06-12 (×4): 40 mg via ORAL
  Filled 2013-06-09 (×5): qty 1

## 2013-06-09 MED ORDER — SODIUM BICARBONATE 650 MG PO TABS: 1300.0000 mg | ORAL_TABLET | Freq: Three times a day (TID) | ORAL | Status: DC

## 2013-06-09 MED ORDER — SODIUM BICARBONATE 650 MG PO TABS
1300.0000 mg | ORAL_TABLET | Freq: Two times a day (BID) | ORAL | Status: DC
  Administered 2013-06-09 – 2013-06-12 (×6): 1300 mg via ORAL
  Filled 2013-06-09 (×7): qty 2

## 2013-06-09 MED ORDER — WARFARIN SODIUM 2.5 MG PO TABS
2.5000 mg | ORAL_TABLET | Freq: Once | ORAL | Status: AC
  Administered 2013-06-09: 2.5 mg via ORAL
  Filled 2013-06-09: qty 1

## 2013-06-09 MED ORDER — BOOST / RESOURCE BREEZE PO LIQD
1.0000 | Freq: Two times a day (BID) | ORAL | Status: DC
  Administered 2013-06-11 – 2013-06-12 (×3): 1 via ORAL

## 2013-06-09 NOTE — Discharge Summary (Signed)
I assisted in the discharge planning of [name].

## 2013-06-09 NOTE — Progress Notes (Signed)
[place] for warfarin Indication: atrial fibrillation  Allergies  Allergen Reactions  . Digoxin And Related     Dig toxicity 2009  . Penicillins Rash    Labs:  Recent Labs  06/07/13 0307 06/07/13 1130 06/08/13 0500 06/09/13 0628  LABPROT 21.9*  --  21.9* 27.7*  INR 1.98*  --  1.98* 2.69*  CREATININE  --  1.58* 1.67* 1.73*    Estimated Creatinine Clearance: 38.2 ml/min (by C-G formula based on Cr of 1.73).  Assessment: 68 year old male with permanent afib recently taken off of Multaq presents to [facility] with continued symptomatic afib. He is on chronic warfarin and INR appears to be very labile past on previous records with concerns for noncompliance. His last reported home dose was 12.5mg  daily, however last Coumadin Clinic note with Paulla Dolly 11/14 stated he was on warfarin 7.5mg  daily.    INR this morning increased rapidly this AM to 2.69. No bleeding noted, CBC stable.   Goal of Therapy:  INR 2-3 Monitor platelets by anticoagulation protocol: Yes   Plan:  1. Coumadin 2.5 mg po x 1 dose tonight 2. Daily PT/INR 3. Follow for s/s bleeding  Thank you. Anette Guarneri, PharmD [phone]  06/09/2013 9:01 AM

## 2013-06-09 NOTE — Progress Notes (Signed)
SUBJECTIVE: The patient is doing well today.  At this time, he denies chest pain, shortness of breath, or any new concerns.  K is quite elevated again today.  He is otherwise without symptoms  CURRENT MEDICATIONS: . buPROPion  75 mg Oral BID  . diltiazem  360 mg Oral Daily  . dronabinol  2.5 mg Oral BID AC  . feeding supplement (ENSURE COMPLETE)  237 mL Oral BID BM  . magnesium oxide  200 mg Oral Daily  . metoprolol succinate  100 mg Oral BID  . nicotine  14 mg Transdermal Daily  . pantoprazole  40 mg Oral Daily  . sodium chloride  3 mL Intravenous Q12H  . sodium chloride  3 mL Intravenous Q12H  . tiotropium  18 mcg Inhalation Daily  . warfarin  2.5 mg Oral ONCE-1800  . Warfarin - Pharmacist Dosing Inpatient   Does not apply q1800   . sodium chloride 100 mL/hr (06/03/13 1220)  . sodium chloride      OBJECTIVE: Physical Exam: Filed Vitals:   06/08/13 1338 06/08/13 2152 06/09/13 0547 06/09/13 0754  BP: 127/84 122/80 119/88 132/83  Pulse: 99 72 91 84  Temp: 98.2 F (36.8 C) 97.8 F (36.6 C) 98.3 F (36.8 C)   TempSrc: Oral Oral Oral   Resp: 18 18 17 18   Height:      Weight:   147 lb 12.8 oz (67.042 kg)   SpO2:  100% 99%     Intake/Output Summary (Last 24 hours) at 06/09/13 0947 Last data filed at 06/09/13 0400  Gross per 24 hour  Intake    600 ml  Output   3475 ml  Net  -2875 ml    Telemetry reveals afib, ventricular rates 80-100  GEN- The patient is well appearing, alert and oriented x 3 today.   Head- normocephalic, atraumatic Eyes-  Sclera clear, conjunctiva pink Ears- hearing intact Oropharynx- clear Neck- supple  Lungs- Clear to ausculation bilaterally, normal work of breathing Heart- irregular rate and rhythm  GI- soft, NT, ND, + BS Extremities- no clubbing, cyanosis, or edema Skin- no rash or lesion Psych- euthymic mood, full affect Neuro- strength and sensation are intact  LABS: Basic Metabolic Panel:  Recent Labs  06/08/13 0500  06/09/13 0628  NA 140 139  K 5.1 7.0*  CL 107 105  CO2 21 22  GLUCOSE 97 99  BUN 32* 27*  CREATININE 1.67* 1.73*  CALCIUM 8.6 9.4   CBC: No results found for this basename: WBC, NEUTROABS, HGB, HCT, MCV, PLT,  in the last 72 hours  RADIOLOGY: Dg Chest 2 View 06/02/2013   CLINICAL DATA:  Chest pain and shortness of breath  EXAM: CHEST  2 VIEW  COMPARISON:  May 27, 2013  FINDINGS: There is a nodular opacity at the left base which may represent a nipple shadow. There is scarring in calcification on the left consistent with prior empyema. There is no frank edema or consolidation. There is underlying emphysematous change. Right lung is clear. Heart is upper normal in size. The pulmonary vascularity is stable and reflects underlying emphysema. No adenopathy. Pacemaker leads are attached to the right ventricle. No bone lesions.  IMPRESSION: Scarring with evidence of prior empyema on the left. Underlying emphysema. No edema or consolidation.  Suspect nipple shadow left base; advise repeat study with nipple markers to confirm that this nodular opacity indeed represents a nipple shadow as opposed to a nodular lesion within the lung parenchyma.   Electronically Signed  By: Lowella Grip M.D.   On: 06/02/2013 15:06   ASSESSMENT AND PLAN:  Principal Problem:   Atrial fibrillation Active Problems:   TOBACCO ABUSE   HYPERTENSION   Long-term (current) use of anticoagulants   Non-ischemic cardiomyopathy   Hyperkalemia  1. afib- went back into afib yesterday INR is now therapeutic--> stop lovenox Continue coumadin (goal INR 2-3) Hold tikosyn until K is better Will not cardiovert today  2. Hyperkalemia Unclear etiology I think that given renal failure and difficulty with unexplained K elevation that we should ask nephrology for input at this time.  3. Chronic renal failure Stable Nephrology consulted for persistently elevated K  4. HTN Stable No change required today

## 2013-06-09 NOTE — Progress Notes (Addendum)
Tikosyn on hold. DCCV cancelled. Patient may resume heart healthy diet. Requested Nephrology consult for hyperkalemia and renal dysfunction. Sent page via Amion to Dr. Jimmy Footman with confirmation received.    12-lead ECG today 8:00 AM - atrial fibrillation, no acute T wave changes; unchanged from previous ECGs Spoke with Dr. Jimmy Footman who agrees to see in consult today. In the meantime, will give kayexalate 45 grams now x 1 dose.

## 2013-06-09 NOTE — Progress Notes (Signed)
06/09/2013 0800 Nurisng note  RN noted on am labs that K was 7.0 this am. Murlean Hark [facility] paged and made aware. Verbal orders received to hold AM dose of Tikosyn. Orders enacted. Pt. Updated on plan of care. Critical Value was called earlier in shift by Landis Martins RN but no documentation of MD awareness of value. Will continue to monitor patient.  Ashia Dehner, Arville Lime

## 2013-06-09 NOTE — Consult Note (Signed)
[facility] CONSULT NOTE    Date: 06/09/2013                  Patient Name:  Travis VONBARGEN Sr.  MRN: [id number]  DOB: 1946/02/04  Age / Sex: 68 y.o., male         PCP: Axel Filler, MD                 Service Requesting Consult: Cardiology                 Reason for Consult: CKD & Hyperkalemia            History of Present Illness: Patient is a 68 y.o. male with a PMHx of CKD III, Paroxysmal Atrial Fibrillation, CHF, COPD, Tachy-Brady syndrome s/p pacemaker placement who was admitted to [facility] on 06/02/2013 for symptomatic Atrial fibrillation.    Patient has recently been admitted for symptomatic Afib earlier this month.  Cardioversion was unsuccessful during prior admission.  Patient was discharge on rate control regimen and anticoagulation with coumadin.  He was seen by Cardiologist Dr. Rayann Heman on 1/12 with complaints of chest tightness and SOB.  Metoprolol was increased to 100 mg BID at that time.  On 1/14, he presented with continued complaints of SOB as well as weakness and occasional chest tightness.  He was found to be in Atrial fibrillation (rate controlled).  Cardiology subsequently admitted and patient was started on Tikosyn.  Patient has intermittently been in Sinus rhythm while being on the Tikosyn.   Nephrology consulted today as patient has continued to have persistent hyperkalemia.   Medications: Outpatient medications: Prescriptions prior to admission  Medication Sig Dispense Refill  . albuterol (PROVENTIL HFA;VENTOLIN HFA) 108 (90 BASE) MCG/ACT inhaler Inhale 2 puffs into the lungs every 6 (six) hours as needed for wheezing or shortness of breath.      [name] buPROPion (WELLBUTRIN) 75 MG tablet Take 1 tablet (75 mg total) by mouth 2 (two) times daily.  60 tablet  5  . diltiazem (CARDIZEM CD) 360 MG 24 hr capsule Take 1 capsule (360 mg total) by mouth daily.  30 capsule  1  . diltiazem (CARDIZEM) 30 MG tablet Take 30 mg by mouth 4 (four) times daily as  needed (for heart racing).      [name] dronabinol (MARINOL) 2.5 MG capsule Take 1 capsule (2.5 mg total) by mouth 2 (two) times daily before lunch and supper.  60 capsule  0  . lisinopril (PRINIVIL,ZESTRIL) 5 MG tablet Take 1 tablet (5 mg total) by mouth daily.  90 tablet  3  . metoprolol succinate (TOPROL-XL) 100 MG 24 hr tablet Take 1 tablet (100 mg total) by mouth 2 (two) times daily. Pt took 150 mg this morning around 8:45am (05/31/12)  180 tablet  3  . omeprazole (PRILOSEC) 20 MG capsule Take 40 mg by mouth daily.      [name] oxyCODONE-acetaminophen (PERCOCET) 10-325 MG per tablet Take 1 tablet by mouth every 6 (six) hours as needed for pain.  120 tablet  0  . tiotropium (SPIRIVA) 18 MCG inhalation capsule Place 1 capsule (18 mcg total) into inhaler and inhale daily.  30 capsule  11  . warfarin (COUMADIN) 5 MG tablet Take 2.5 tablets (12.5 mg total) by mouth daily.  30 tablet  1    Current medications: Current Facility-Administered Medications  Medication Dose Route Frequency Provider Last Rate Last Dose  . 0.9 %  sodium chloride infusion  250 mL Intravenous PRN  [name], MD      . 0.9 %  sodium chloride infusion   Intravenous Continuous Thompson Grayer, MD 100 mL/hr at 06/03/13 1220 100 mL/hr at 06/03/13 1220  . 0.9 %  sodium chloride infusion  250 mL Intravenous Continuous Thompson Grayer, MD      . acetaminophen (TYLENOL) tablet 650 mg  650 mg Oral Q4H PRN Rhonda G Barrett, PA-C      . albuterol (PROVENTIL) (2.5 MG/3ML) 0.083% nebulizer solution 2.5 mg  2.5 mg Nebulization Q6H PRN [name], MD      . ALPRAZolam Duanne Moron) tablet 0.25 mg  0.25 mg Oral BID PRN Evelene Croon Barrett, PA-C   0.25 mg at 06/07/13 2244  . buPROPion [facility]) tablet 75 mg  75 mg Oral BID Evelene Croon Barrett, PA-C   75 mg at 06/09/13 1017  . diltiazem (CARDIZEM CD) 24 hr capsule 360 mg  360 mg Oral Daily Rhonda G Barrett, PA-C   360 mg at 06/09/13 1017  . diphenhydrAMINE (BENADRYL) capsule 25 mg  25 mg Oral QHS PRN,MR X 1  Kendra P Hiatt, RPH   25 mg at 06/05/13 0051  . docusate sodium (COLACE) capsule 100 mg  100 mg Oral BID PRN Ritta Slot, NP   100 mg at 06/06/13 1018  . dronabinol (MARINOL) capsule 2.5 mg  2.5 mg Oral BID AC Rhonda G Barrett, PA-C   2.5 mg at 06/09/13 1145  . feeding supplement (ENSURE COMPLETE) (ENSURE COMPLETE) liquid 237 mL  237 mL Oral BID BM Rogue Bussing, RD   237 mL at 06/06/13 1650  . magnesium oxide (MAG-OX) tablet 200 mg  200 mg Oral Daily Rhonda G Barrett, PA-C   200 mg at 06/09/13 1017  . metoprolol succinate (TOPROL-XL) 24 hr tablet 100 mg  100 mg Oral BID Rhonda G Barrett, PA-C   100 mg at 06/09/13 1017  . nicotine (NICODERM CQ - dosed in mg/24 hours) patch 14 mg  14 mg Transdermal Daily Rhonda G Barrett, PA-C   14 mg at 06/09/13 1000  . nitroGLYCERIN (NITROSTAT) SL tablet 0.4 mg  0.4 mg Sublingual Q5 Min x 3 PRN Rhonda G Barrett, PA-C      . ondansetron (ZOFRAN) injection 4 mg  4 mg Intravenous Q6H PRN Rhonda G Barrett, PA-C      . oxyCODONE-acetaminophen (PERCOCET/ROXICET) 5-325 MG per tablet 1 tablet  1 tablet Oral Q6H PRN Rocky Crafts Hammons, RPH   1 tablet at 06/09/13 0433   And  . oxyCODONE (Oxy IR/ROXICODONE) immediate release tablet 5 mg  5 mg Oral Q6H PRN Rocky Crafts Hammons, RPH   5 mg at 06/09/13 0434  . pantoprazole (PROTONIX) EC tablet 40 mg  40 mg Oral Daily Rhonda G Barrett, PA-C   40 mg at 06/09/13 1017  . sodium chloride 0.9 % injection 3 mL  3 mL Intravenous Q12H [name], MD   3 mL at 06/08/13 1103  . sodium chloride 0.9 % injection 3 mL  3 mL Intravenous PRN [name], MD      . sodium chloride 0.9 % injection 3 mL  3 mL Intravenous Q12H Thompson Grayer, MD   3 mL at 06/09/13 1023  . sodium chloride 0.9 % injection 3 mL  3 mL Intravenous PRN Thompson Grayer, MD      . tiotropium [facility]) inhalation capsule 18 mcg  18 mcg Inhalation Daily Rhonda G Barrett, PA-C   18 mcg at 06/09/13 1053  .  warfarin (COUMADIN) tablet 2.5 mg  2.5 mg Oral  ONCE-1800 [name], MD      . Warfarin - Pharmacist Dosing Inpatient   Does not apply [place], [facility]        Allergies: Allergies  Allergen Reactions  . Digoxin And Related     Dig toxicity 2009  . Penicillins Rash   Past Medical History: Past Medical History  Diagnosis Date  . Hypertension   . CKD (chronic kidney disease)     Renal U/S 12/04/2009 showed no pathological findings. Labs 12/04/2009 include normal ESR, C3, C4; neg ANA; SPEP showed nonspecific increase in the alpha-2 region with no M-spike; UPEP showed no monoclonal free light chains; urine IFE showed polyclonal increase in feree Kappa and/or free Lambda light chains. Baseline Cr reported 1.7-2.5.  [name] Chronic systolic heart failure     a. NICM - EF 35% with normal cors 2003. b. Last echo 11/2012 - EF 25-30%.  . Depression   . GERD (gastroesophageal reflux disease)   . Portal vein thrombosis   . Avascular necrosis     right hip s/p replacement  . Gastritis   . Alcohol abuse   . Erectile dysfunction   . Bipolar disorder   . Hydrocele, unspecified   . Intermittent claudication   . Lung nodule     Chest CT scan on 12/14/2008 showed a nodular opacity at the left lung base felt to most likely represent scarring.  Follow-up chest CT scan on 06/02/2010 showed parenchymal scarring in the left apex, left  lower lobe, and lingula; some of this scarring at the left base had a nodular appearance, unchanged. Chest 09/2012 - stable.  . 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  Thyroid scan 08/28/2009 was normal with no focal areas of abnormal increased or decreased activity seen; the uptake of I 131 sodium iodide at 24 hours was 5.7%.  TSH and free T4 normalized by 08/16/2009.  [name] Pneumonia   . Intestinal obstruction   . COPD (chronic obstructive pulmonary disease)   . Clostridium difficile colitis   . E coli bacteremia   . Cluster headaches   . DVT (deep venous thrombosis)      He has hypercoagulability with multiple prior DVTs  . Pleural plaque     H/o asbestos exposure. Chest CT on 06/02/2010 showed stable extensive calcified pleural plaques involving the left hemithorax, consistent with asbestos related pleural disease.  . Weight loss     Normal colonoscopy by Dr. Olevia Perches on 11/06/2010.  . Tachycardia-bradycardia syndrome     a. 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 b. St. Jude gen change 2010.  [name] PAF (paroxysmal atrial fibrillation)     Paroxysmal, failed medical therapy with amiodarone but has done very well with multaq  . Osteoarthritis   . Spondylosis   . Chronic low back pain     "cause of my hip" (03/17/2012)  . 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  . Noncompliance   . Atrial flutter   . Thrombocytopenia    Past Surgical History: Past Surgical History  Procedure Laterality Date  . Left hip bipolar hemiarthroplasty  09/09/2002    By Dr. Lafayette Dragon  . Pacemaker insertion  02/2003    Dr. Roe Rutherford, [place] pulse generator identity SR model [id number] serial [id number]; gen change by Greggory Brandy  . Total hip  arthroplasty  03/08/2008    right; By Dr. Hiram Comber  . Tee without cardioversion N/A 05/27/2013    Procedure: TRANSESOPHAGEAL ECHOCARDIOGRAM (TEE);  Surgeon: Candee Furbish, MD;  Location: [facility] ENDOSCOPY;  Service: Cardiovascular;  Laterality: N/A;  . Cardioversion N/A 05/27/2013    Procedure: CARDIOVERSION;  Surgeon: Candee Furbish, MD;  Location: [facility] END[facility]COPY;  Service: Cardiovascular;  Laterality: N/A;  . Cardiac catheterization  2003    Nl cors, EF 35%  . Pacemaker placement  06/17/2008    Lead and generator change w/ St. Jude Medical Tendril ST model [id number] (serial  [id number]).        Family History: Family History  Problem Relation Age of Onset  . Hypertension Mother   . Cancer Brother     Unsure of type.  . Asthma Mother   . Coronary  artery disease Mother   . Colon cancer Neg Hx   . Lung cancer Neg Hx   . Prostate cancer Neg Hx    Social History: History   Social History  . Marital Status: Widowed    Spouse Name: N/A    Number of Children: 10  . Years of Education: N/A   Occupational History  . RETIRED    Social History Main Topics  . Smoking status: Current Every Day Smoker -- 0.50 packs/day for 50 years    Types: Cigarettes  . Smokeless tobacco: Never Used  . Alcohol Use: No     Comment: prior etoh abuse  . Drug Use: 1.00 per week    Special: Marijuana     Comment: prior marijuana  . Sexual Activity: Yes   Other Topics Concern  . Not on file   Social History Narrative   Works as a Architect part time   Smoker 1 pack last 1.5 days tobacco, smokes marijuana    Denies EtOH x 4 years (on 10/12/12)   12 kids    From [place]   [place] Veteran    12 grade education             Review of Systems: Per HPI  Vital Signs: Blood pressure 132/83, pulse 84, temperature 98.3 F (36.8 C), temperature source Oral, resp. rate 18, height 6' 3.2" (1.91 m), weight 147 lb 12.8 oz (67.042 kg), SpO2 99.00%.  Weight trends: Filed Weights   06/07/13 0500 06/08/13 0416 06/09/13 0547  Weight: 150 lb 8 oz (68.266 kg) 150 lb 5.7 oz (68.2 kg) 147 lb 12.8 oz (67.042 kg)    Physical Exam: Exam: General: resting in bed, NAD. Patient disgruntled about prolonged hospital stay.  HEENT: NCAT. No scleral icterus.  Cardiovascular: Irregularly irregular. No murmurs appreciated. Gr3/6 m R [place] Pacer Respiratory: Scattered wheezing noted.  Abdomen: soft, nontender, nondistended, no organomegaly.Liver down 4 cm Extremities: No LE edema.skin discoloration , pulses 1+/4+ Skin: Warm, dry, intact. Neuro: No focal deficits.  Lab results: Basic Metabolic Panel:  Recent Labs Lab 06/03/13 0535 06/04/13 0417 06/05/13 0455  06/07/13 1130 06/08/13 0500 06/09/13 0628  NA 141 139 142  < > 138 140 139  K 6.2* 5.9* 5.9*  <  > 5.8* 5.1 7.0*  CL 105 108 105  < > 105 107 105  CO2 '23 21 20  ' < > '22 21 22  ' GLUCOSE 86 84 62*  < > 99 97 99  BUN 35* 31* 36*  < > 30* 32* 27*  CREATININE 1.84* 1.64* 1.76*  < > 1.58* 1.67* 1.73*  CALCIUM 9.1 8.9 9.7  < >  8.7 8.6 9.4  MG 2.1 1.8 2.4  --   --   --   --   < > = values in this interval not displayed.  CBC:  Recent Labs Lab 06/06/13 0428  WBC 9.4  HGB 14.0  HCT 42.9  MCV 92.9  PLT 180   Microbiology: Results for orders placed during the hospital encounter of 06/01/10  TECHNOLOGIST SMEAR REVIEW     Status: None   Collection Time    06/03/10  5:30 AM      Result Value Range Status   Path Review ATYPICAL LYMPHOCYTES ELLIPTOCYTES   Final    Coagulation Studies:  Recent Labs  06/07/13 0307 06/08/13 0500 06/09/13 0628  LABPROT 21.9* 21.9* 27.7*  INR 1.98* 1.98* 2.69*    Urinalysis: Urinalysis    Component Value Date/Time   COLORURINE YELLOW 06/02/2013 [place] 06/02/2013 1305   LABSPEC 1.022 06/02/2013 1305   PHURINE 6.0 06/02/2013 1305   GLUCOSEU NEGATIVE 06/02/2013 1305   HGBUR NEGATIVE 06/02/2013 1305   HGBUR trace-intact 09/01/2008 1533   BILIRUBINUR NEGATIVE 06/02/2013 1305   KETONESUR NEGATIVE 06/02/2013 1305   PROTEINUR NEGATIVE 06/02/2013 1305   UROBILINOGEN 1.0 06/02/2013 1305   NITRITE NEGATIVE 06/02/2013 1305   LEUKOCYTESUR NEGATIVE 06/02/2013 1305   Imaging: Dg Chest 2 View 06/02/2013   IMPRESSION: Scarring with evidence of prior empyema on the left. Underlying emphysema. No edema or consolidation.  Suspect nipple shadow left base; advise repeat study with nipple markers to confirm that this nodular opacity indeed represents a nipple shadow as opposed to a nodular lesion within the lung parenchyma.  Ct Chest Wo Contrast 06/02/2013   IMPRESSION: 1. No acute cardiopulmonary process. 2. Stable appearance of the lungs with left worse than right calcified pleural plaques and extensive pleural parenchymal scarring superimposed on a  background of COPD and emphysema. 3. Atherosclerosis including coronary artery disease. 4. Cardiomegaly with left heart enlargement. 5. 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   Assessment & Plan: Patient is a 68 y.o. male with a PMHx of CKD, Paroxysmal Atrial Fibrillation, CHF, COPD, Tachy-Brady syndrome s/p pacemaker placement who was admitted to [facility] on 06/02/2013 for symptomatic Atrial fibrillation.   Nephrology consulted due to persistent hyperkalemia.   1) Atrial Fibrillation 2) Tachy-Brady syndrome s/p pacemaker placement 3) COPD 4) CKD stage III - Baseline Creatinine 1.4 - 1.8 5) Combined Systolic and Diastolic CHF 6) Persistent Hyperkalemia Patient has CKD3 and evidence of Type 4 RTA.  ???etiology, toxin vs other vs just his CKD.  Needs base source and may need loop diuretic to facilitate K excretion . Avoid ACEI, ARB, aldos antag.  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  - Daily Renal function panel - NO ACEI/ARB or Potassium sparing Diuretics. - In regards to CKD, will obtain Iron studies and PTH.  Patient has had a recent renal US in 01/2013.   Atrial Fibrillation - Per primary team  [place] PGY-2

## 2013-06-09 NOTE — Progress Notes (Signed)
06/09/2013 8:43 AM Nursing note  Dr. Rayann Heman on floor and stated procedure for today would not be done to resume pt. Prior diet. Orders placed and patient updated on plan of care. Will continue to monitor patient.  Betzy Barbier, [place]ville Lime

## 2013-06-09 NOTE — Progress Notes (Signed)
NUTRITION FOLLOW UP  DOCUMENTATION CODES Per approved criteria  -Severe malnutrition in the context of chronic illness -Underweight   INTERVENTION: Resource Breeze po BID, each supplement provides 250 kcal and 9 grams of protein  Continue HS snack daily RD to follow for nutrition care plan  NUTRITION DIAGNOSIS: Increased nutrient needs related to malnutrition and chronic illness as evidenced by estimated nutrition needs, ongoing  Goal: Pt to meet >/= 90% of their estimated nutrition needs, met  Monitor:  PO & supplemental intake, weight, labs, I/O's  ASSESSMENT: Patient with PMH of atrial fibrillation, HTN, CKD,and chronic systolic heart failure was hospitalized 5/0-9/32 for complications with atrial fibrillation. Patient went to ER on 1/14 with weakness, dizziness, and dyspnea on exertion.   Patient frustrated with Low Potassium diet 1/19.  Placed back on Regular diet.  Appetite good.  PO intake 100% per flowsheet records.  A this time, he reports he doesn't really care for Ensure supplements.  RD offered [employer] which he was willing to try -- will order.  Height: Ht Readings from Last 1 Encounters:  06/02/13 6' 3.2" (1.91 m)    Weight: Wt Readings from Last 1 Encounters:  06/09/13 147 lb 12.8 oz (67.042 kg)    Estimated Nutritional Needs: Kcal: 1800-2000  Protein: 95-105 gm  Fluid: 1.8-2.0 L  Skin: Intact  Diet Order: General   Intake/Output Summary (Last 24 hours) at 06/09/13 1510 Last data filed at 06/09/13 1145  Gross per 24 hour  Intake    600 ml  Output   3025 ml  Net  -2425 ml    Labs:   Recent Labs Lab 06/03/13 0535 06/04/13 0417 06/05/13 0455  06/07/13 1130 06/08/13 0500 06/09/13 0628  NA 141 139 142  < > 138 140 139  K 6.2* 5.9* 5.9*  < > 5.8* 5.1 7.0*  CL 105 108 105  < > 105 107 105  CO2 $Re'23 21 20  'IBv$ < > $R'22 21 22  'kd$ BUN 35* 31* 36*  < > 30* 32* 27*  CREATININE 1.84* 1.64* 1.76*  < > 1.58* 1.67* 1.73*  CALCIUM 9.1 8.9 9.7  < > 8.7  8.6 9.4  MG 2.1 1.8 2.4  --   --   --   --   GLUCOSE 86 84 62*  < > 99 97 99  < > = values in this interval not displayed.   Scheduled Meds: . buPROPion  75 mg Oral BID  . diltiazem  360 mg Oral Daily  . dronabinol  2.5 mg Oral BID AC  . feeding supplement (ENSURE COMPLETE)  237 mL Oral BID BM  . magnesium oxide  200 mg Oral Daily  . metoprolol succinate  100 mg Oral BID  . nicotine  14 mg Transdermal Daily  . pantoprazole  40 mg Oral Daily  . sodium chloride  3 mL Intravenous Q12H  . sodium chloride  3 mL Intravenous Q12H  . tiotropium  18 mcg Inhalation Daily  . warfarin  2.5 mg Oral ONCE-1800  . Warfarin - Pharmacist Dosing Inpatient   Does not apply q1800    Continuous Infusions: . sodium chloride 100 mL/hr (06/03/13 1220)  . sodium chloride      Past Medical History  Diagnosis Date  . Hypertension   . CKD (chronic kidney disease)     Renal U/S 12/04/2009 showed no pathological findings. Labs 12/04/2009 include normal ESR, C3, C4; neg ANA; SPEP showed nonspecific increase in the alpha-2 region with no M-spike; UPEP showed  no monoclonal free light chains; urine IFE showed polyclonal increase in feree Kappa and/or free Lambda light chains. Baseline Cr reported 1.7-2.5.  [name] Chronic systolic heart failure     a. NICM - EF 35% with normal cors 2003. b. Last echo 11/2012 - EF 25-30%.  . Depression   . GERD (gastroesophageal reflux disease)   . Portal vein thrombosis   . Avascular necrosis     right hip s/p replacement  . Gastritis   . Alcohol abuse   . Erectile dysfunction   . Bipolar disorder   . Hydrocele, unspecified   . Intermittent claudication   . Lung nodule     Chest CT scan on 12/14/2008 showed a nodular opacity at the left lung base felt to most likely represent scarring.  Follow-up chest CT scan on 06/02/2010 showed parenchymal scarring in the left apex, left  lower lobe, and lingula; some of this scarring at the left base had a nodular appearance, unchanged. Chest  09/2012 - stable.  . 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  Thyroid scan 08/28/2009 was normal with no focal areas of abnormal increased or decreased activity seen; the uptake of I 131 sodium iodide at 24 hours was 5.7%.  TSH and free T4 normalized by 08/16/2009.  [name] Pneumonia   . Intestinal obstruction   . COPD (chronic obstructive pulmonary disease)   . Clostridium difficile colitis   . E coli bacteremia   . Cluster headaches   . DVT (deep venous thrombosis)     He has hypercoagulability with multiple prior DVTs  . Pleural plaque     H/o asbestos exposure. Chest CT on 06/02/2010 showed stable extensive calcified pleural plaques involving the left hemithorax, consistent with asbestos related pleural disease.  . Weight loss     Normal colonoscopy by Dr. Olevia Perches on 11/06/2010.  . Tachycardia-bradycardia syndrome     a. 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 b. St. Jude gen change 2010.  [name] PAF (paroxysmal atrial fibrillation)     Paroxysmal, failed medical therapy with amiodarone but has done very well with multaq  . Osteoarthritis   . Spondylosis   . Chronic low back pain     "cause of my hip" (03/17/2012)  . 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  . Noncompliance   . Atrial flutter   . Thrombocytopenia     Past Surgical History  Procedure Laterality Date  . Left hip bipolar hemiarthroplasty  09/09/2002    By Dr. Lafayette Dragon  . Pacemaker insertion  02/2003    Dr. Roe Rutherford, [place] pulse generator identity SR model [id number] serial [id number]; gen change by Greggory Brandy  . Total hip arthroplasty  03/08/2008    right; By Dr. Hiram Comber  . Tee without cardioversion N/A 05/27/2013    Procedure: TRANSESOPHAGEAL ECHOCARDIOGRAM (TEE);  Surgeon: Candee Furbish, MD;  Location: [facility] ENDOSCOPY;  Service: Cardiovascular;  Laterality: N/A;  . Cardioversion  N/A 05/27/2013    Procedure: CARDIOVERSION;  Surgeon: Candee Furbish, MD;  Location: [facility] ENDOSCOPY;  Service: Cardiovascular;  Laterality: N/A;  . Cardiac catheterization  2003    Nl cors, EF 35%  . Pacemaker placement  06/17/2008    Lead and generator change w/ St. Jude Medical Tendril ST model [id number] (serial  [id number]).         Arthur Holms, RD, LDN Pager #: [phone] After-Hours Pager #:  [phone]

## 2013-06-10 DIAGNOSIS — N189 Chronic kidney disease, unspecified: Secondary | ICD-10-CM

## 2013-06-10 DIAGNOSIS — I4891 Unspecified atrial fibrillation: Secondary | ICD-10-CM

## 2013-06-10 DIAGNOSIS — N2589 Other disorders resulting from impaired renal tubular function: Secondary | ICD-10-CM

## 2013-06-10 DIAGNOSIS — I10 Essential (primary) hypertension: Secondary | ICD-10-CM

## 2013-06-10 LAB — CBC
HEMATOCRIT: 47.6 % (ref 39.0–52.0)
Hemoglobin: 16.3 g/dL (ref 13.0–17.0)
MCH: 31 pg (ref 26.0–34.0)
MCHC: 34.2 g/dL (ref 30.0–36.0)
MCV: 90.5 fL (ref 78.0–100.0)
Platelets: 207 10*3/uL (ref 150–400)
RBC: 5.26 MIL/uL (ref 4.22–5.81)
RDW: 15.1 % (ref 11.5–15.5)
WBC: 9.2 10*3/uL (ref 4.0–10.5)

## 2013-06-10 LAB — BASIC METABOLIC PANEL
BUN: 29 mg/dL — AB (ref 6–23)
CHLORIDE: 104 meq/L (ref 96–112)
CO2: 21 mEq/L (ref 19–32)
Calcium: 8.9 mg/dL (ref 8.4–10.5)
Creatinine, Ser: 1.86 mg/dL — ABNORMAL HIGH (ref 0.50–1.35)
GFR calc non Af Amer: 35 mL/min — ABNORMAL LOW (ref >90)
GFR, EST AFRICAN AMERICAN: 41 mL/min — AB (ref >90)
GLUCOSE: 100 mg/dL — AB (ref 70–99)
POTASSIUM: 4.9 meq/L (ref 3.7–5.3)
SODIUM: 141 meq/L (ref 137–147)

## 2013-06-10 LAB — RENAL FUNCTION PANEL
Albumin: 4 g/dL (ref 3.5–5.2)
BUN: 26 mg/dL — ABNORMAL HIGH (ref 6–23)
CO2: 26 meq/L (ref 19–32)
Calcium: 9.5 mg/dL (ref 8.4–10.5)
Chloride: 104 mEq/L (ref 96–112)
Creatinine, Ser: 1.78 mg/dL — ABNORMAL HIGH (ref 0.50–1.35)
GFR calc non Af Amer: 37 mL/min — ABNORMAL LOW (ref >90)
GFR, EST AFRICAN AMERICAN: 43 mL/min — AB (ref >90)
GLUCOSE: 111 mg/dL — AB (ref 70–99)
Phosphorus: 4.5 mg/dL (ref 2.3–4.6)
Potassium: 6.4 mEq/L — ABNORMAL HIGH (ref 3.7–5.3)
SODIUM: 142 meq/L (ref 137–147)

## 2013-06-10 LAB — IRON AND TIBC
Iron: 133 ug/dL (ref 42–135)
Saturation Ratios: 42 % (ref 20–55)
TIBC: 319 ug/dL (ref 215–435)
UIBC: 186 ug/dL (ref 125–400)

## 2013-06-10 LAB — PROTIME-INR
INR: 2.32 — ABNORMAL HIGH (ref 0.00–1.49)
Prothrombin Time: 24.7 s — ABNORMAL HIGH (ref 11.6–15.2)

## 2013-06-10 LAB — CORTISOL-AM, BLOOD: Cortisol - AM: 10.2 ug/dL (ref 4.3–22.4)

## 2013-06-10 IMAGING — CT CT CHEST W/O CM
3 series · 17 of 29 positions shown, 19 images · non-contrast
Comparison: CT chest 12/14/2008 and 06/02/2010.

CLINICAL DATA: Cough and shortness of breath.  History of asbestos
exposure.

CT CHEST WITHOUT CONTRAST
TECHNIQUE: Multidetector CT imaging of the chest was performed
following the standard protocol without IV contrast.

[Series 2: routine chest · axial · 0.85mm/px · z∈[-332,-97]mm · 6 of 71 slices shown, 8 images]
[im 12/71  mediastinal]
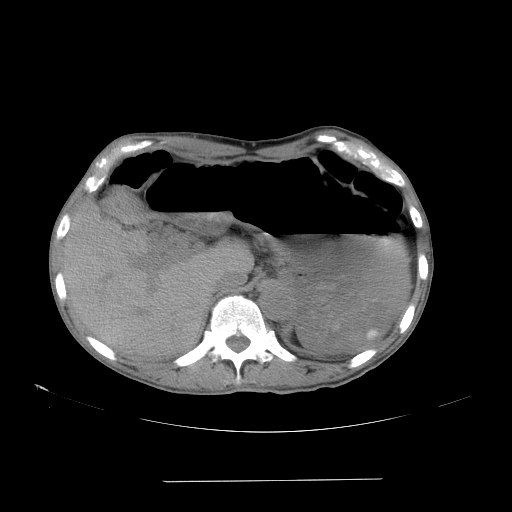
[im 12/71  lung]
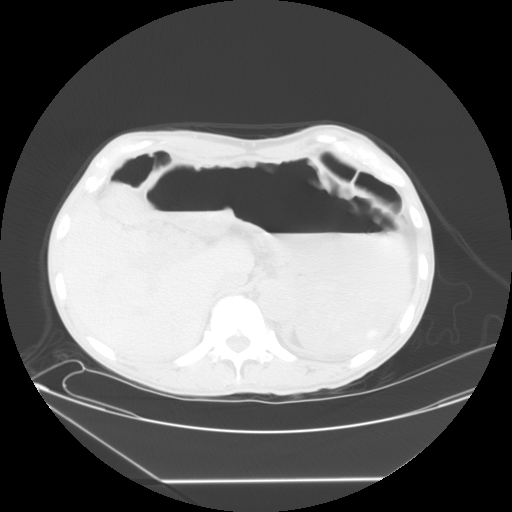
[im 24/71  lung]
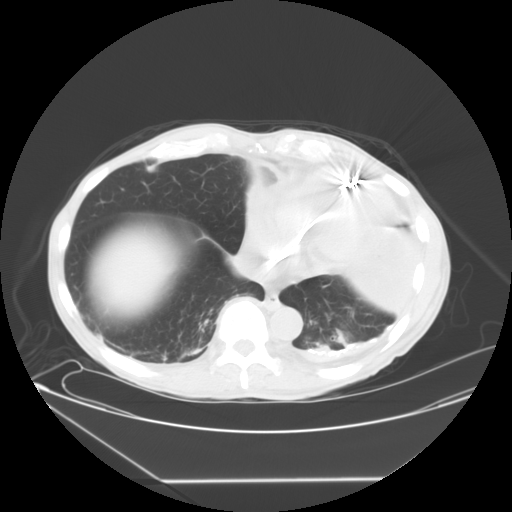
[im 36/71  lung]
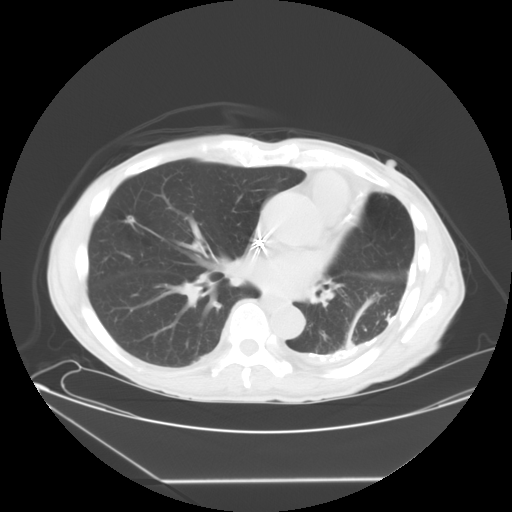
[im 39/71  lung]
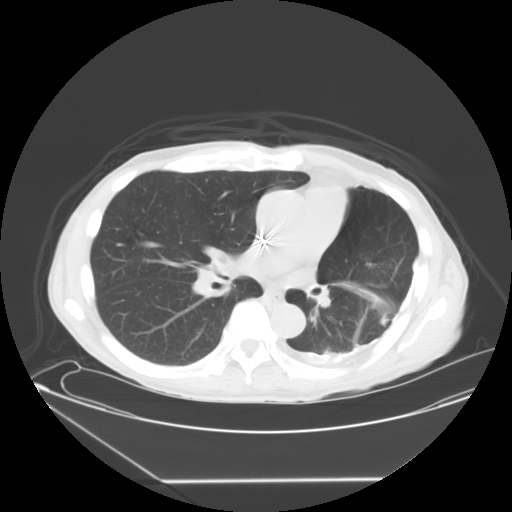
[im 47/71  mediastinal]
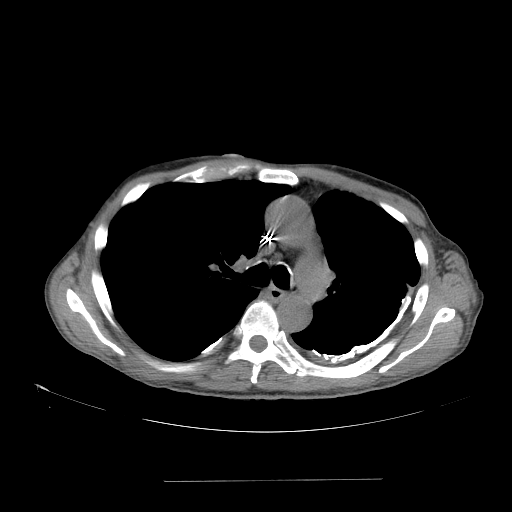
[im 47/71  lung]
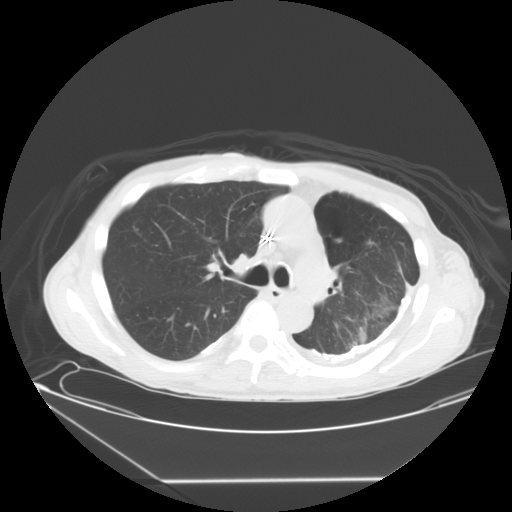
[im 59/71  lung]
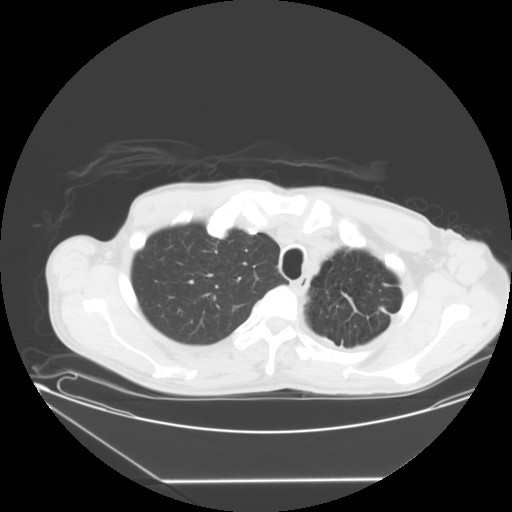

[Series 400: cor · coronal · 0.85mm/px · 3 of 72 slices shown]
[im 11/72  lung]
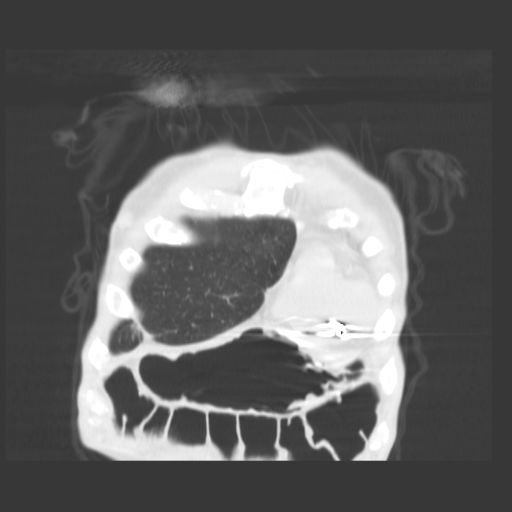
[im 21/72  lung]
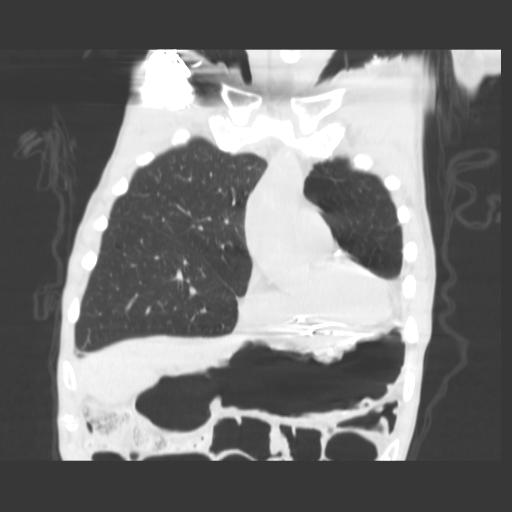
[im 31/72  lung]
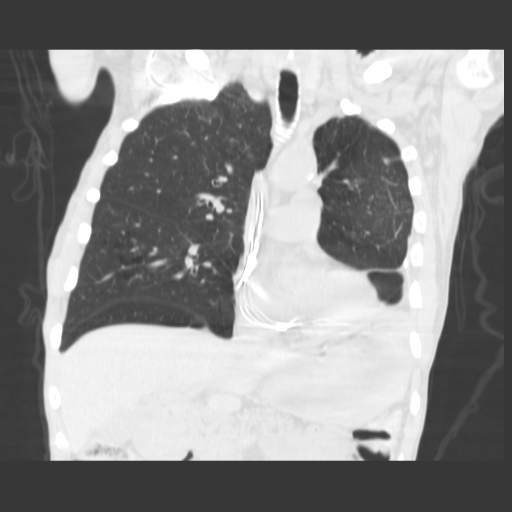

[Series 401: sag · sagittal · 0.85mm/px · 8 of 119 slices shown]
[im 10/119  lung]
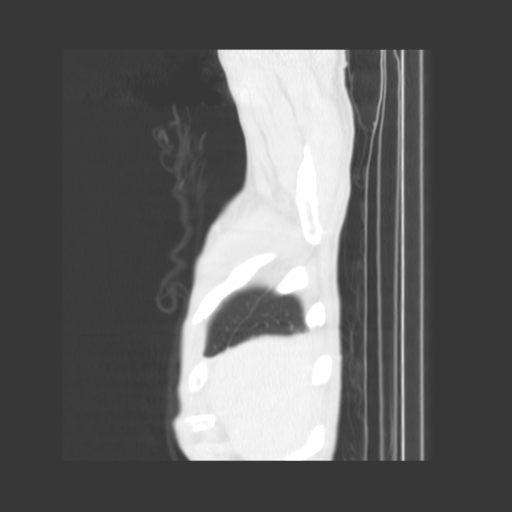
[im 30/119  lung]
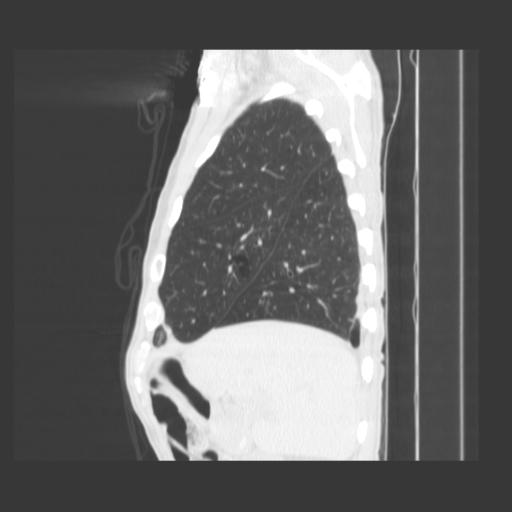
[im 40/119  lung]
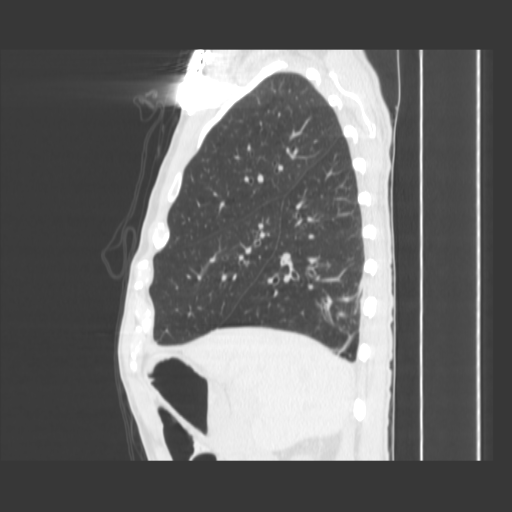
[im 50/119  lung]
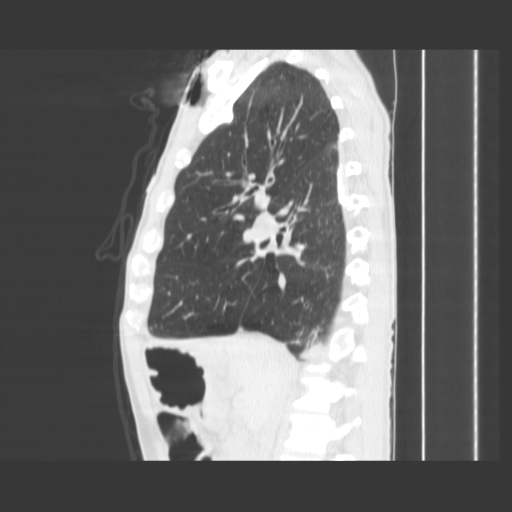
[im 69/119  lung]
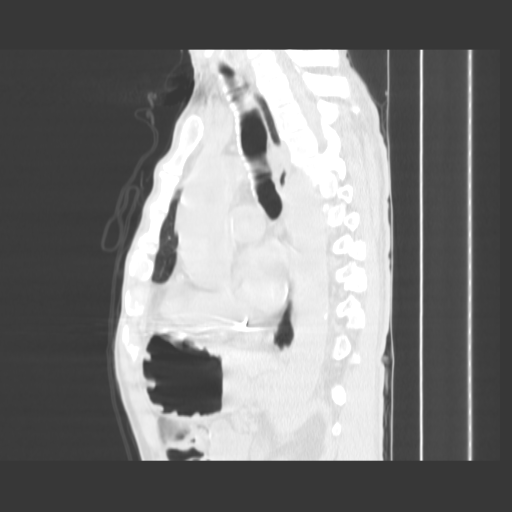
[im 79/119  lung]
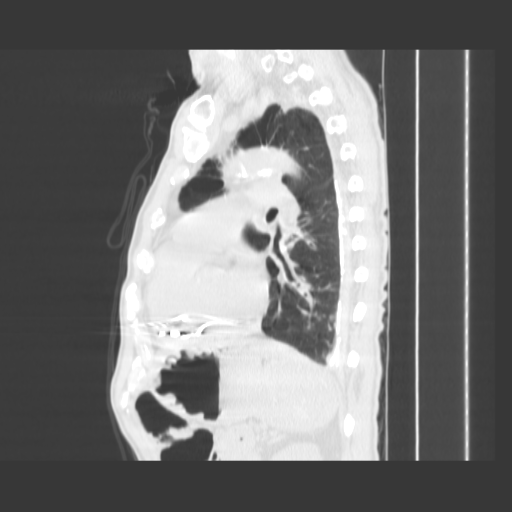
[im 89/119  lung]
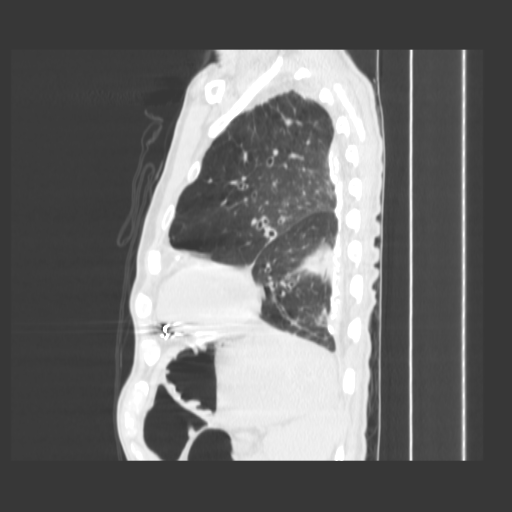
[im 109/119  lung]
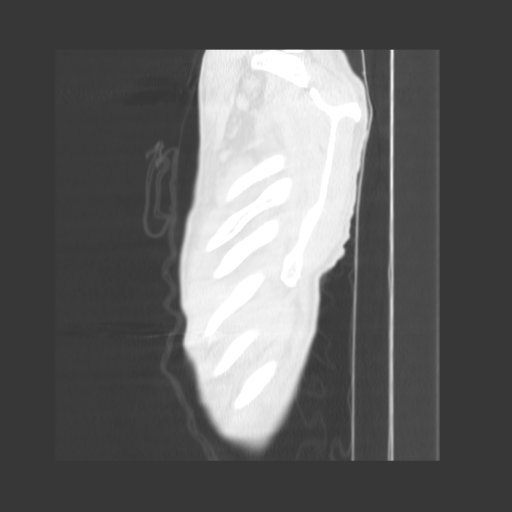

[17 of 29 positions shown; findings below may reference images not displayed]

FINDINGS: Volume loss is again seen in the left chest with
extensive pleural calcification. Mild degree of calcified pleural
plaque formation is identified on the right.  The appearance is
unchanged.  There is no pleural or pericardial effusion.  Heart
size is normal.  No axillary, hilar mediastinal lymphadenopathy is
identified.  Calcific aortic and coronary atherosclerotic vascular
disease is seen.  Pacing device is noted.

Lungs demonstrate unchanged scarring on the left.  A nodular
opacity in the right middle lobe measuring 0.5 cm is identified on
image 36 and stable in appearance.  Emphysematous change is noted.
There is some bronchiectatic change in the bases.  The lungs are
otherwise unremarkable.  Incidentally imaged upper abdomen shows
distention of the stomach.  No focal bony abnormality is
identified.
IMPRESSION: 1.  No acute finding in the chest.
2.  Volume loss and pulmonary scar in the left chest.
3.  Bilateral calcified pleural plaques compatible with asbestos
exposure.
4.  Gaseous distention of the stomach.
5.  Calcific coronary and aortic atherosclerotic vascular disease.

## 2013-06-10 MED ORDER — WARFARIN SODIUM 7.5 MG PO TABS
7.5000 mg | ORAL_TABLET | Freq: Once | ORAL | Status: AC
  Administered 2013-06-10: 7.5 mg via ORAL
  Filled 2013-06-10: qty 1

## 2013-06-10 MED ORDER — COSYNTROPIN 0.25 MG IJ SOLR
0.2500 mg | Freq: Once | INTRAMUSCULAR | Status: AC
  Administered 2013-06-10: 0.25 mg via INTRAVENOUS
  Filled 2013-06-10: qty 0.25

## 2013-06-10 MED ORDER — SODIUM POLYSTYRENE SULFONATE 15 GM/60ML PO SUSP
45.0000 g | Freq: Once | ORAL | Status: AC
  Administered 2013-06-10: 45 g via ORAL
  Filled 2013-06-10: qty 180

## 2013-06-10 MED ORDER — COSYNTROPIN 0.25 MG IJ SOLR
0.2500 mg | Freq: Once | INTRAMUSCULAR | Status: DC
  Filled 2013-06-10: qty 0.25

## 2013-06-10 MED ORDER — DILTIAZEM HCL ER COATED BEADS 240 MG PO CP24
240.0000 mg | ORAL_CAPSULE | Freq: Two times a day (BID) | ORAL | Status: DC
  Administered 2013-06-10 – 2013-06-12 (×4): 240 mg via ORAL
  Filled 2013-06-10 (×5): qty 1

## 2013-06-10 NOTE — Progress Notes (Signed)
Called by RN to see patient for dizziness. [name] states he has felt this way all week but it is worse today. He denies CP, SOB or palpitations. His family reports he has not been eating well and he states he is "just not hungry." On telemetry, he remains in AFib with V rates 120-140 bpm. He is on metoprolol succinate 100 mg twice daily and diltiazem which was increased this AM to 240 mg twice daily. Spoke with Dr. Rayann Heman. Okay to proceed with ACTH stimulation test. Will check CBC and BMET now.

## 2013-06-10 NOTE — Progress Notes (Addendum)
06/10/2013 [place] PAC on floor and made aware as well of pt. Continued elevated HR afib 120-140. Mostly in 130's. BP 120/85. O2 95% on RA.  Pt. Did express to RN that he has experiences dizziness throughout the week but was feeling worse today. This information shared with PA. PA to see patient and make recommendations. Will continue to monitor patient.  Markel Kurtenbach, Arville Lime

## 2013-06-10 NOTE — Progress Notes (Signed)
[facility] Progress Note   Subjective:   Pt upset he's still in the hospital, denies diarrhea    Objective:   BP 124/93  Pulse 91  Temp(Src) 97.8 F (36.6 C) (Oral)  Resp 18  Ht 6' 3.2" (1.91 m)  Wt 147 lb 7.8 oz (66.9 kg)  BMI 18.34 kg/m2  SpO2 100%  Intake/Output Summary (Last 24 hours) at 06/10/13 0805 Last data filed at 06/10/13 0537  Gross per 24 hour  Intake    600 ml  Output   2150 ml  Net  -1550 ml   Weight change: -5 oz (-0.142 kg)  Physical Exam: General: resting in bed, NAD.  HEENT: NCAT. No scleral icterus.  Cardiovascular: Irregularly irregular. 2/6 SEM, pacer palpable  Respiratory: Scattered wheezing noted.  Abdomen: soft, nontender, nondistended, no organomegaly.  Liver palpable  Extremities: No LE edema, 2/4 pulses B/L LE  Skin: Warm, dry, intact.  Neuro: No focal deficits.   Imaging: No results found.  Labs: BMET  Recent Labs Lab 06/05/13 1610 06/06/13 0909 06/06/13 1913 06/07/13 1130 06/08/13 0500 06/09/13 0628 06/10/13 0500  NA 141 139 139 138 140 139 142  K 6.1* 6.9* 6.6* 5.8* 5.1 7.0* 6.4*  CL 108 108 106 105 107 105 104  CO2 20 19 18* 22 21 22 26   GLUCOSE 108* 85 126* 99 97 99 111*  BUN 35* 32* 34* 30* 32* 27* 26*  CREATININE 1.81* 1.63* 1.69* 1.58* 1.67* 1.73* 1.78*  CALCIUM 8.9 9.0 9.1 8.7 8.6 9.4 9.5  PHOS  --   --   --   --   --   --  4.5   CBC  Recent Labs Lab 06/06/13 0428  WBC 9.4  HGB 14.0  HCT 42.9  MCV 92.9  PLT 180    Medications:    . buPROPion  75 mg Oral BID  . diltiazem  360 mg Oral Daily  . dronabinol  2.5 mg Oral BID AC  . feeding supplement (RESOURCE BREEZE)  1 Container Oral BID BM  . furosemide  40 mg Oral Daily  . magnesium oxide  200 mg Oral Daily  . metoprolol succinate  100 mg Oral BID  . nicotine  14 mg Transdermal Daily  . pantoprazole  40 mg Oral Daily  . sodium bicarbonate  1,300 mg Oral BID  . sodium chloride  3 mL Intravenous Q12H  . sodium chloride  3 mL  Intravenous Q12H  . tiotropium  18 mcg Inhalation Daily  . Warfarin - Pharmacist Dosing Inpatient   Does not apply q1800      Assessment/ Plan:   Patient is a 68 y.o. male with a PMHx of CKD, Paroxysmal Atrial Fibrillation, CHF, COPD, Tachy-Brady syndrome s/p pacemaker placement who was admitted to [facility] on 06/02/2013 for symptomatic Atrial fibrillation.  Nephrology consulted due to persistent hyperkalemia.   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  High K diet.  ? tikosyn  Plan:  1) Creatinine stable at baseline, tx with Lasix 40 mg and NaBicarb.  Will need Lasix daily long term  2) Avoid ACE/ARB/Aldosterone 3) UNa - 144 and UK+ - 52, Aldo < 1 4) Trend daily renal panel  5) F/U Iron studies and PTH and Cortisol   6) Possible  d/c later today, f/u in one week for labs and 3 weeks for re-evaluation.    Nolon Rod, DO PGY-2, [facility] 06/10/2013, 8:05 AM I have seen and examined this patient and agree with the plan of care seen, eval, examined, counseled and discussed. Will f/u outpatient .  Chana Lindstrom L 06/10/2013, 11:20 AM

## 2013-06-10 NOTE — Progress Notes (Addendum)
06/10/2013 1200 Nursing note Dr. Rayann Heman paged and made aware of pt. HR Afib continuing to be elevated while pt. Awake ranging 120-140. Pt. currently Asymptomatic. No new orders at this time. Will continue to monitor patient.  [name], Arville Lime

## 2013-06-10 NOTE — Progress Notes (Addendum)
06/10/2013 3:27 PM Nursing note Clarified with Pharmacist Helene Kelp to dilute Cortrosyn 0.25mg  in 1 CC of NS then further dilute to total of 3 cc NS and push IV over 2 minutes. Orders enacted. Phlebotomy contacted upon arrival of med on floor for administration per protocol.  [name], Arville Lime

## 2013-06-10 NOTE — Progress Notes (Signed)
SUBJECTIVE: The patient is doing well today.  At this time, he denies chest pain, shortness of breath, or any new concerns.   He is otherwise without symptoms  CURRENT MEDICATIONS: . buPROPion  75 mg Oral BID  . cosyntropin  0.25 mg Intravenous Once  . diltiazem  360 mg Oral Daily  . dronabinol  2.5 mg Oral BID AC  . feeding supplement (RESOURCE BREEZE)  1 Container Oral BID BM  . furosemide  40 mg Oral Daily  . magnesium oxide  200 mg Oral Daily  . metoprolol succinate  100 mg Oral BID  . nicotine  14 mg Transdermal Daily  . pantoprazole  40 mg Oral Daily  . sodium bicarbonate  1,300 mg Oral BID  . sodium chloride  3 mL Intravenous Q12H  . tiotropium  18 mcg Inhalation Daily  . warfarin  7.5 mg Oral ONCE-1800  . Warfarin - Pharmacist Dosing Inpatient   Does not apply q1800   . sodium chloride 100 mL/hr (06/03/13 1220)  . sodium chloride      OBJECTIVE: Physical Exam: Filed Vitals:   06/09/13 2100 06/10/13 0533 06/10/13 0940 06/10/13 1123  BP: 121/76 124/93 117/93   Pulse: 93 91 121   Temp: 98.4 F (36.9 C) 97.8 F (36.6 C)    TempSrc: Oral Oral    Resp: 19 18    Height:      Weight:  147 lb 7.8 oz (66.9 kg)    SpO2: 97% 100%  99%    Intake/Output Summary (Last 24 hours) at 06/10/13 1349 Last data filed at 06/10/13 1100  Gross per 24 hour  Intake    240 ml  Output   1750 ml  Net  -1510 ml    Telemetry reveals afib, ventricular rates 100 bpm  GEN- The patient is well appearing, alert and oriented x 3 today.   Head- normocephalic, atraumatic Eyes-  Sclera clear, conjunctiva pink Ears- hearing intact Oropharynx- clear Neck- supple  Lungs- Clear to ausculation bilaterally, normal work of breathing Heart- irregular rate and rhythm  GI- soft, NT, ND, + BS Extremities- no clubbing, cyanosis, or edema Skin- no rash or lesion Psych- euthymic mood, full affect Neuro- strength and sensation are intact  LABS: Basic Metabolic Panel:  Recent Labs   06/09/13 0628 06/10/13 0500  NA 139 142  K 7.0* 6.4*  CL 105 104  CO2 22 26  GLUCOSE 99 111*  BUN 27* 26*  CREATININE 1.73* 1.78*  CALCIUM 9.4 9.5  PHOS  --  4.5   CBC: No results found for this basename: WBC, NEUTROABS, HGB, HCT, MCV, PLT,  in the last 72 hours  RADIOLOGY: Dg Chest 2 View 06/02/2013   CLINICAL DATA:  Chest pain and shortness of breath  EXAM: CHEST  2 VIEW  COMPARISON:  May 27, 2013  FINDINGS: There is a nodular opacity at the left base which may represent a nipple shadow. There is scarring in calcification on the left consistent with prior empyema. There is no frank edema or consolidation. There is underlying emphysematous change. Right lung is clear. Heart is upper normal in size. The pulmonary vascularity is stable and reflects underlying emphysema. No adenopathy. Pacemaker leads are attached to the right ventricle. No bone lesions.  IMPRESSION: Scarring with evidence of prior empyema on the left. Underlying emphysema. No edema or consolidation.  Suspect nipple shadow left base; advise repeat study with nipple markers to confirm that this nodular opacity indeed represents a nipple shadow as  opposed to a nodular lesion within the lung parenchyma.   Electronically Signed   By: Lowella Grip M.D.   On: 06/02/2013 15:06   ASSESSMENT AND PLAN:  Principal Problem:   Atrial fibrillation Active Problems:   TOBACCO ABUSE   HYPERTENSION   Long-term (current) use of anticoagulants   Non-ischemic cardiomyopathy   Hyperkalemia  1. afib- will focus on rate control Stop tikosyn due to elevated K (this has been rarely reported to occur with tikosyn) Will plan rate cotnrol going forward I will increase diltiazem to 460mg  daily today  2. Hyperkalemia Appreciate nephrology input Cosyntropin stim today Per nephrology, lasix and bicarb Repeat bmet in 1 week by nephrology with close outpatient nephrology follow-up  3. Chronic renal failure Stable  4. HTN Stable No  change required today  Plan to discharge tomorrow Will need bmet on Monday and follow-up with nephrology in 3 weeks Follow-up with me 07/07/13 as scheduled

## 2013-06-10 NOTE — Progress Notes (Signed)
[place] for warfarin Indication: atrial fibrillation  Allergies  Allergen Reactions  . Digoxin And Related     Dig toxicity 2009  . Penicillins Rash    Labs:  Recent Labs  06/08/13 0500 06/09/13 0628 06/10/13 0500  LABPROT 21.9* 27.7* 24.7*  INR 1.98* 2.69* 2.32*  CREATININE 1.67* 1.73* 1.78*    Estimated Creatinine Clearance: 37.1 ml/min (by C-G formula based on Cr of 1.78).  Assessment: 68 year old male with permanent afib recently taken off of Multaq presents to [facility] with continued symptomatic afib. He is on chronic warfarin and INR appears to be very labile past on previous records with concerns for noncompliance. His last reported home dose was 12.5mg  daily, however last Coumadin Clinic note with Paulla Dolly 11/14 stated he was on warfarin 7.5mg  daily.    INR = 2.32 this AM, CBC ok  Goal of Therapy:  INR 2-3 Monitor platelets by anticoagulation protocol: Yes   Plan:  1. Coumadin 7.5 mg po x 1 dose tonight 2. Daily PT/INR 3. Follow for s/s bleeding  Thank you. Anette Guarneri, PharmD [phone]  06/10/2013 8:51 AM

## 2013-06-11 LAB — RENAL FUNCTION PANEL
Albumin: 3.1 g/dL — ABNORMAL LOW (ref 3.5–5.2)
BUN: 37 mg/dL — ABNORMAL HIGH (ref 6–23)
CO2: 24 meq/L (ref 19–32)
Calcium: 8.8 mg/dL (ref 8.4–10.5)
Chloride: 105 mEq/L (ref 96–112)
Creatinine, Ser: 1.91 mg/dL — ABNORMAL HIGH (ref 0.50–1.35)
GFR calc non Af Amer: 34 mL/min — ABNORMAL LOW (ref >90)
GFR, EST AFRICAN AMERICAN: 40 mL/min — AB (ref >90)
Glucose, Bld: 89 mg/dL (ref 70–99)
PHOSPHORUS: 5.4 mg/dL — AB (ref 2.3–4.6)
POTASSIUM: 4.7 meq/L (ref 3.7–5.3)
SODIUM: 143 meq/L (ref 137–147)

## 2013-06-11 LAB — ACTH STIMULATION, 3 TIME POINTS
CORTISOL 30 MIN: 24.9 ug/dL (ref >20.0)
Cortisol, 60 Min: 28.8 ug/dL (ref >20)
Cortisol, Base: 7.8 ug/dL

## 2013-06-11 LAB — PROTIME-INR
INR: 2.37 — ABNORMAL HIGH (ref 0.00–1.49)
Prothrombin Time: 25.1 seconds — ABNORMAL HIGH (ref 11.6–15.2)

## 2013-06-11 LAB — CBC
HEMATOCRIT: 44.2 % (ref 39.0–52.0)
Hemoglobin: 15.2 g/dL (ref 13.0–17.0)
MCH: 31.2 pg (ref 26.0–34.0)
MCHC: 34.4 g/dL (ref 30.0–36.0)
MCV: 90.8 fL (ref 78.0–100.0)
Platelets: 175 10*3/uL (ref 150–400)
RBC: 4.87 MIL/uL (ref 4.22–5.81)
RDW: 15.1 % (ref 11.5–15.5)
WBC: 10.3 10*3/uL (ref 4.0–10.5)

## 2013-06-11 LAB — PARATHYROID HORMONE, INTACT (NO CA): PTH: 245.8 pg/mL — ABNORMAL HIGH (ref 14.0–72.0)

## 2013-06-11 MED ORDER — WARFARIN SODIUM 7.5 MG PO TABS
7.5000 mg | ORAL_TABLET | Freq: Once | ORAL | Status: AC
  Administered 2013-06-11: 7.5 mg via ORAL
  Filled 2013-06-11: qty 1

## 2013-06-11 NOTE — Progress Notes (Signed)
[facility] Progress Note   Subjective:   Pt states he is unhappy to still be here, does not want to talk further    Objective:   BP 105/72  Pulse 93  Temp(Src) 97.7 F (36.5 C) (Oral)  Resp 20  Ht 6' 3.2" (1.91 m)  Wt 146 lb 9.7 oz (66.5 kg)  BMI 18.23 kg/m2  SpO2 100%  Intake/Output Summary (Last 24 hours) at 06/11/13 0807 Last data filed at 06/11/13 0432  Gross per 24 hour  Intake    540 ml  Output    350 ml  Net    190 ml   Weight change: -14.1 oz (-0.4 kg)  Physical Exam: General: resting in bed, NAD.  HEENT: NCAT. No scleral icterus.  Cardiovascular: Irregularly irregular. 2/6 SEM  Respiratory: Scattered wheezing noted.  Abdomen: soft, nontender, nondistended, no organomegaly.  Liver palpable  Extremities: No LE edema, 2/4 pulses B/[name] LE  Skin: Warm, dry, intact.  Neuro: No focal deficits.   Imaging: No results found.  Labs: BMET  Recent Labs Lab 06/06/13 1913 06/07/13 1130 06/08/13 0500 06/09/13 0628 06/10/13 0500 06/10/13 1614 06/11/13 0628  NA 139 138 140 139 142 141 143  K 6.6* 5.8* 5.1 7.0* 6.4* 4.9 4.7  CL 106 105 107 105 104 104 105  CO2 18* 22 21 22 26 21 24   GLUCOSE 126* 99 97 99 111* 100* 89  BUN 34* 30* 32* 27* 26* 29* 37*  CREATININE 1.69* 1.58* 1.67* 1.73* 1.78* 1.86* 1.91*  CALCIUM 9.1 8.7 8.6 9.4 9.5 8.9 8.8  PHOS  --   --   --   --  4.5  --  5.4*   CBC  Recent Labs Lab 06/06/13 0428 06/10/13 1614 06/11/13 0638  WBC 9.4 9.2 10.3  HGB 14.0 16.3 15.2  HCT 42.9 47.6 44.2  MCV 92.9 90.5 90.8  PLT 180 207 175    Medications:    . buPROPion  75 mg Oral BID  . diltiazem  240 mg Oral BID  . dronabinol  2.5 mg Oral BID AC  . feeding supplement (RESOURCE BREEZE)  1 Container Oral BID BM  . furosemide  40 mg Oral Daily  . magnesium oxide  200 mg Oral Daily  . metoprolol succinate  100 mg Oral BID  . nicotine  14 mg Transdermal Daily  . pantoprazole  40 mg Oral Daily  . sodium bicarbonate  1,300 mg Oral BID   . sodium chloride  3 mL Intravenous Q12H  . tiotropium  18 mcg Inhalation Daily  . Warfarin - Pharmacist Dosing Inpatient   Does not apply q1800      Assessment/ Plan:   Patient is a 68 y.o. male with a PMHx of CKD, Paroxysmal Atrial Fibrillation, CHF, COPD, Tachy-Brady syndrome s/p pacemaker placement who was admitted to [facility] on 06/02/2013 for symptomatic Atrial fibrillation.  Nephrology consulted due to persistent hyperkalemia.   1) Atrial Fibrillation - Per Primary  2) Tachy-Brady syndrome s/p pacemaker placement  3) COPD  4) CKD stage III - Baseline Creatinine 1.4 - 1.8, continue evaluation  CKD and RTA account for most of K issues.  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    Plan:  1) Creatinine stable  at baseline, tx with Lasix 40 mg and NaBicarb.  Will need Lasix daily long term.  2) Avoid ACE/ARB/Aldosterone 3) UNa - 144 and UK+ - 52, Aldo < 1.  ACTH stimulation proper response with normal AM serum cortisol, will need long term lasix/bicarb  4) Iron studies normal,  PTH pending  5) D/C later today, BMET on Monday, f/u 3 weeks for re-evaluation.  Renal will sign off, re consult as needed.    Travis Rod, DO PGY-2, [facility] 06/11/2013, 8:07 AM . I have seen and examined this patient and agree with the plan of care seen, eval, examined,counseled and discussed with resident. .  Render [name] 06/11/2013, 10:57 AM

## 2013-06-11 NOTE — Progress Notes (Signed)
Ambulated in hallway with patient using rolling walker and 1+ assist, gait steady , heart rate up to 140's by end of the walk, then assisted to the bathroom complaint of  no bm since Monday will continue to monitor [name]

## 2013-06-11 NOTE — Progress Notes (Signed)
S.kr       Patient Name: Travis ATKERSON Sr.      SUBJECTIVE:  Admitted with afib and attempts to use tikosyn were coincidental to marked elevation in K,  No specidc cause was identified and attributed to CKD and RTA although, new to me, tikosyn rarely associeated with this  Plan is rate control for afib  Feels better today and moving about  Past Medical History  Diagnosis Date  . Hypertension   . CKD (chronic kidney disease)     Renal U/S 12/04/2009 showed no pathological findings. Labs 12/04/2009 include normal ESR, C3, C4; neg ANA; SPEP showed nonspecific increase in the alpha-2 region with no M-spike; UPEP showed no monoclonal free light chains; urine IFE showed polyclonal increase in feree Kappa and/or free Lambda light chains. Baseline Cr reported 1.7-2.5.  [name] Chronic systolic heart failure     a. NICM - EF 35% with normal cors 2003. b. Last echo 11/2012 - EF 25-30%.  . Depression   . GERD (gastroesophageal reflux disease)   . Portal vein thrombosis   . Avascular necrosis     right hip s/p replacement  . Gastritis   . Alcohol abuse   . Erectile dysfunction   . Bipolar disorder   . Hydrocele, unspecified   . Intermittent claudication   . Lung nodule     Chest CT scan on 12/14/2008 showed a nodular opacity at the left lung base felt to most likely represent scarring.  Follow-up chest CT scan on 06/02/2010 showed parenchymal scarring in the left apex, left  lower lobe, and lingula; some of this scarring at the left base had a nodular appearance, unchanged. Chest 09/2012 - stable.  . 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  Thyroid scan 08/28/2009 was normal with no focal areas of abnormal increased or decreased activity seen; the uptake of I 131 sodium iodide at 24 hours was 5.7%.  TSH and free T4 normalized by 08/16/2009.  [name] Pneumonia   . Intestinal obstruction   . COPD (chronic obstructive pulmonary disease)   . Clostridium difficile  colitis   . E coli bacteremia   . Cluster headaches   . DVT (deep venous thrombosis)     He has hypercoagulability with multiple prior DVTs  . Pleural plaque     H/o asbestos exposure. Chest CT on 06/02/2010 showed stable extensive calcified pleural plaques involving the left hemithorax, consistent with asbestos related pleural disease.  . Weight loss     Normal colonoscopy by Dr. [name] on 11/06/2010.  . Tachycardia-bradycardia syndrome     a. 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 b. [employer] gen change 2010.  [name] PAF (paroxysmal atrial fibrillation)     Paroxysmal, failed medical therapy with amiodarone but has done very well with multaq  . Osteoarthritis   . Spondylosis   . Chronic low back pain     "cause of my hip" (03/17/2012)  . 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  . Noncompliance   . Atrial flutter   . Thrombocytopenia     Scheduled Meds:  Scheduled Meds: . buPROPion  75 mg Oral BID  . diltiazem  240 mg Oral BID  . dronabinol  2.5 mg Oral BID AC  . feeding supplement (RESOURCE BREEZE)  1 Container Oral BID BM  . furosemide  40 mg Oral Daily  . magnesium oxide  200 mg Oral Daily  . metoprolol succinate  100 mg Oral BID  . nicotine  14 mg Transdermal Daily  . pantoprazole  40 mg Oral Daily  . sodium bicarbonate  1,300 mg Oral BID  . sodium chloride  3 mL Intravenous Q12H  . tiotropium  18 mcg Inhalation Daily  . warfarin  7.5 mg Oral ONCE-1800  . Warfarin - Pharmacist Dosing Inpatient   Does not apply q1800   Continuous Infusions: . sodium chloride 100 mL/hr (06/03/13 1220)  . sodium chloride      PHYSICAL EXAM Filed Vitals:   06/11/13 0426 06/11/13 0500 06/11/13 1000 06/11/13 1401  BP: 105/72  110/70 104/70  Pulse:  93 98 88  Temp: 97.7 F (36.5 C)   97.8 F (36.6 C)  TempSrc: Oral   Oral  Resp: 20   19  Height:      Weight: 146 lb 9.7 oz (66.5 kg)      SpO2: 100%   95%   Well developed and nourished in no acute distress HENT normal Neck supple with JVP-flat Carotids brisk and full without bruits Clear Irregularly irregular rate and rhythm with rapid ventricular response, 2/6 early systolic murmur Abd-soft with active BS without hepatomegaly No Clubbing cyanosis edema Skin-warm and dry A & Oriented  Grossly normal sensory and motor function   TELEMETRY: Reviewed telemetry pt in SFib 90-120:    Intake/Output Summary (Last 24 hours) at 06/11/13 1438 Last data filed at 06/11/13 1245  Gross per 24 hour  Intake    600 ml  Output    400 ml  Net    200 ml    LABS: Basic Metabolic Panel:  Recent Labs Lab 06/05/13 0455  06/06/13 1913 06/07/13 1130 06/08/13 0500 06/09/13 0628 06/10/13 0500 06/10/13 1614 06/11/13 0628  NA 142  < > 139 138 140 139 142 141 143  K 5.9*  < > 6.6* 5.8* 5.1 7.0* 6.4* 4.9 4.7  CL 105  < > 106 105 107 105 104 104 105  CO2 20  < > 18* _0 GLUCOSE 62*  < > 126* 99 97 99 111* 100* 89  BUN 36*  < > 34* 30* 32* 27* 26* 29* 37*  CREATININE 1.76*  < > 1.69* 1.58* 1.67* 1.73* 1.78* 1.86* 1.91*  CALCIUM 9.7  < > 9.1 8.7 8.6 9.4 9.5 8.9 8.8  MG 2.4  --   --   --   --   --   --   --   --   PHOS  --   --   --   --   --   --  4.5  --  5.4*  < > = values in this interval not displayed. Cardiac Enzymes: No results found for this basename: CKTOTAL, CKMB, CKMBINDEX, TROPONINI,  in the last 72 hours CBC:  Recent Labs Lab 06/06/13 0428 06/10/13 1614 06/11/13 0638  WBC 9.4 9.2 10.3  HGB 14.0 16.3 15.2  HCT 42.9 47.6 44.2  MCV 92.9 90.5 90.8  PLT 180 207 175   PROTIME:  Recent Labs  06/09/13 0628 06/10/13 0500 06/11/13 0638  LABPROT 27.7* 24.7* 25.1*  INR 2.69* 2.32* 2.37*   Liver Function Tests:  Recent Labs  06/10/13 0500 06/11/13 0628  ALBUMIN 4.0 3.1*   No results found for this basename: LIPASE, AMYLASE,  in the last 72 hours BNP: BNP (last 3 results)  Recent  Labs  05/17/13 1225 05/22/13 1920 06/02/13  [place] 6363.0* 9662.0* 3125.0*     Recent Labs  06/10/13 0915  TIBC 319  IRON 133       ASSESSMENT AND PLAN:  Principal Problem:   Atrial fibrillation Active Problems:   TOBACCO ABUSE   HYPERTENSION   Long-term (current) use of anticoagulants   Non-ischemic cardiomyopathy   Hyperkalemia   Plan was discharge today with rate control  HR>100 ; dilt increased yesterday per Dr Greggory Brandy  Will watch overnight and discharge in am    Signed, Virl Axe MD  06/11/2013

## 2013-06-11 NOTE — Progress Notes (Signed)
ANTICOAGULATION CONSULT NOTE - Follow Up Consult  Pharmacy Consult for Coumadin Indication: atrial fibrillation & history of DVTs  Allergies  Allergen Reactions  . Digoxin And Related     Dig toxicity 2009  . Penicillins Rash    Patient Measurements: Height: 6' 3.2" (191 cm) Weight: 146 lb 9.7 oz (66.5 kg) IBW/kg (Calculated) : 84.95 Heparin Dosing Weight:   Vital Signs: Temp: 97.7 F (36.5 C) (01/23 0426) Temp src: Oral (01/23 0426) BP: 110/70 mmHg (01/23 1000) Pulse Rate: 98 (01/23 1000)  Labs:  Recent Labs  06/09/13 0628 06/10/13 0500 06/10/13 1614 06/11/13 0628 06/11/13 0638  HGB  --   --  16.3  --  15.2  HCT  --   --  47.6  --  44.2  PLT  --   --  207  --  175  LABPROT 27.7* 24.7*  --   --  25.1*  INR 2.69* 2.32*  --   --  2.37*  CREATININE 1.73* 1.78* 1.86* 1.91*  --     Estimated Creatinine Clearance: 34.3 ml/min (by C-G formula based on Cr of 1.91).   Medications:  Scheduled:  . buPROPion  75 mg Oral BID  . diltiazem  240 mg Oral BID  . dronabinol  2.5 mg Oral BID AC  . feeding supplement (RESOURCE BREEZE)  1 Container Oral BID BM  . furosemide  40 mg Oral Daily  . magnesium oxide  200 mg Oral Daily  . metoprolol succinate  100 mg Oral BID  . nicotine  14 mg Transdermal Daily  . pantoprazole  40 mg Oral Daily  . sodium bicarbonate  1,300 mg Oral BID  . sodium chloride  3 mL Intravenous Q12H  . tiotropium  18 mcg Inhalation Daily  . Warfarin - Pharmacist Dosing Inpatient   Does not apply q1800    Assessment: 68yo male with AFib and history VTE.  INR 2.37 this AM, Hg & pltc wnl.  No bleeding problems noted.  Goal of Therapy:  INR 2-3 Monitor platelets by anticoagulation protocol: Yes   Plan:  1-  Coumadin 7.5mg  today 2-  F/U in AM  Gracy Bruins, PharmD [facility]

## 2013-06-11 NOTE — Progress Notes (Signed)
[name] refused further lab sticks at this time as phlebotomist was unsuccessful.  Labs will be rescheduled for later in the morning.    [name]

## 2013-06-12 ENCOUNTER — Encounter (HOSPITAL_COMMUNITY): Payer: Self-pay | Admitting: Physician Assistant

## 2013-06-12 DIAGNOSIS — J449 Chronic obstructive pulmonary disease, unspecified: Secondary | ICD-10-CM

## 2013-06-12 LAB — RENAL FUNCTION PANEL
ALBUMIN: 3.4 g/dL — AB (ref 3.5–5.2)
BUN: 41 mg/dL — ABNORMAL HIGH (ref 6–23)
CALCIUM: 8.9 mg/dL (ref 8.4–10.5)
CO2: 27 mEq/L (ref 19–32)
CREATININE: 2.15 mg/dL — AB (ref 0.50–1.35)
Chloride: 104 mEq/L (ref 96–112)
GFR, EST AFRICAN AMERICAN: 34 mL/min — AB (ref >90)
GFR, EST NON AFRICAN AMERICAN: 30 mL/min — AB (ref >90)
Glucose, Bld: 97 mg/dL (ref 70–99)
PHOSPHORUS: 4.8 mg/dL — AB (ref 2.3–4.6)
Potassium: 4.8 mEq/L (ref 3.7–5.3)
Sodium: 145 mEq/L (ref 137–147)

## 2013-06-12 LAB — CBC
HCT: 44.5 % (ref 39.0–52.0)
Hemoglobin: 15.2 g/dL (ref 13.0–17.0)
MCH: 30.8 pg (ref 26.0–34.0)
MCHC: 34.2 g/dL (ref 30.0–36.0)
MCV: 90.3 fL (ref 78.0–100.0)
PLATELETS: 176 10*3/uL (ref 150–400)
RBC: 4.93 MIL/uL (ref 4.22–5.81)
RDW: 15.1 % (ref 11.5–15.5)
WBC: 8.9 10*3/uL (ref 4.0–10.5)

## 2013-06-12 LAB — PROTIME-INR
INR: 2.23 — AB (ref 0.00–1.49)
PROTHROMBIN TIME: 24 s — AB (ref 11.6–15.2)

## 2013-06-12 MED ORDER — FUROSEMIDE 40 MG PO TABS: 40.0000 mg | ORAL_TABLET | Freq: Every day | ORAL | Status: DC

## 2013-06-12 MED ORDER — WARFARIN SODIUM 7.5 MG PO TABS
7.5000 mg | ORAL_TABLET | Freq: Every day | ORAL | Status: DC
  Filled 2013-06-12: qty 1

## 2013-06-12 MED ORDER — METOPROLOL SUCCINATE ER 100 MG PO TB24: 100.0000 mg | ORAL_TABLET | Freq: Two times a day (BID) | ORAL | Status: DC

## 2013-06-12 MED ORDER — SODIUM BICARBONATE 650 MG PO TABS: 1300.0000 mg | ORAL_TABLET | Freq: Two times a day (BID) | ORAL | Status: DC

## 2013-06-12 MED ORDER — DILTIAZEM HCL ER COATED BEADS 240 MG PO CP24: 240.0000 mg | ORAL_CAPSULE | Freq: Two times a day (BID) | ORAL | Status: DC

## 2013-06-12 NOTE — Progress Notes (Signed)
[place] for warfarin Indication: atrial fibrillation  Allergies  Allergen Reactions  . Digoxin And Related     Dig toxicity 2009  . Penicillins Rash    Labs:  Recent Labs  06/10/13 0500  06/10/13 1614 06/11/13 0628 06/11/13 0638 06/12/13 0425  HGB  --   < > 16.3  --  15.2 15.2  HCT  --   --  47.6  --  44.2 44.5  PLT  --   --  207  --  175 176  LABPROT 24.7*  --   --   --  25.1* 24.0*  INR 2.32*  --   --   --  2.37* 2.23*  CREATININE 1.78*  --  1.86* 1.91*  --  2.15*  < > = values in this interval not displayed.  Estimated Creatinine Clearance: 30.7 ml/min (by C-G formula based on Cr of 2.15).  Assessment: 68 year old male with permanent afib recently taken off of Multaq presents to [facility] with continued symptomatic afib. He is on chronic warfarin and INR appears to be very labile past on previous records with concerns for noncompliance. His last reported home dose was 12.5mg  daily, however last Coumadin Clinic note with Paulla Dolly 11/14 stated he was on warfarin 7.5mg  daily.    INR = 2.23 this AM, CBC ok  Goal of Therapy:  INR 2-3 Monitor platelets by anticoagulation protocol: Yes   Plan:  1. Coumadin 7.5 mg po daily 2. Daily PT/INR 3. Follow for s/s bleeding  Thank you. Anette Guarneri, PharmD [phone]  06/12/2013 11:09 AM

## 2013-06-12 NOTE — Discharge Summary (Signed)
Personally seen and examined. Agree with above. 35 min spent on DC. Review of medical records, labs, meds, appts. Please see my prior note for details.

## 2013-06-12 NOTE — Discharge Summary (Signed)
Discharge Summary   Patient ID: [name]., MRN: [id number], DOB/AGE: [birth year] 68 y.o.  Admit date: 06/02/2013 Discharge date: 06/12/2013   Primary Care Physician:  Axel Filler   Primary Cardiologist:  Dr. Thompson Grayer  Primary Electrophysiologist:  Dr. Thompson Grayer   Reason for Admission:   Symptomatic Atrial Fibrillation   Primary Discharge Diagnoses:  Principal Problem:   Atrial fibrillation Active Problems:   TOBACCO ABUSE   HYPERTENSION   Long-term (current) use of anticoagulants   Chronic kidney disease   Chronic combined systolic and diastolic congestive heart failure   Non-ischemic cardiomyopathy   Hyperkalemia     Wt Readings from Last 3 Encounters:  06/12/13 147 lb 11.3 oz (67 kg)  06/12/13 147 lb 11.3 oz (67 kg)  05/31/13 159 lb (72.122 kg)    Secondary Discharge Diagnoses:   Past Medical History  Diagnosis Date  . Hypertension   . CKD (chronic kidney disease)     Renal U/S 12/04/2009 showed no pathological findings. Labs 12/04/2009 include normal ESR, C3, C4; neg ANA; SPEP showed nonspecific increase in the alpha-2 region with no M-spike; UPEP showed no monoclonal free light chains; urine IFE showed polyclonal increase in feree Kappa and/or free Lambda light chains. Baseline Cr reported 1.7-2.5.  [name] Chronic systolic heart failure     a. NICM - EF 35% with normal cors 2003. b. Last echo 11/2012 - EF 25-30%.  . Depression   . GERD (gastroesophageal reflux disease)   . Portal vein thrombosis   . Avascular necrosis     right hip s/p replacement  . Gastritis   . Alcohol abuse   . Erectile dysfunction   . Bipolar disorder   . Hydrocele, unspecified   . Intermittent claudication   . Lung nodule     Chest CT scan on 12/14/2008 showed a nodular opacity at the left lung base felt to most likely represent scarring.  Follow-up chest CT scan on 06/02/2010 showed parenchymal scarring in the left apex, left  lower lobe, and lingula; some of this  scarring at the left base had a nodular appearance, unchanged. Chest 09/2012 - stable.  . 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  Thyroid scan 08/28/2009 was normal with no focal areas of abnormal increased or decreased activity seen; the uptake of I 131 sodium iodide at 24 hours was 5.7%.  TSH and free T4 normalized by 08/16/2009.  [name] Pneumonia   . Intestinal obstruction   . COPD (chronic obstructive pulmonary disease)   . Clostridium difficile colitis   . E coli bacteremia   . Cluster headaches   . DVT (deep venous thrombosis)     He has hypercoagulability with multiple prior DVTs  . Pleural plaque     H/o asbestos exposure. Chest CT on 06/02/2010 showed stable extensive calcified pleural plaques involving the left hemithorax, consistent with asbestos related pleural disease.  . Weight loss     Normal colonoscopy by Dr. Olevia Perches on 11/06/2010.  . Tachycardia-bradycardia syndrome     a. 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 b. St. Jude gen change 2010.  [name] PAF (paroxysmal atrial fibrillation)     Paroxysmal, failed medical therapy with amiodarone but has done very well with multaq;  d/c 2/2 to recurrent AFib;  Tikosyn load c/b Hyperkalemia during admx 05/2013 => rate control strategy  . Osteoarthritis   . Spondylosis   . Chronic low back pain     "cause of my hip" (03/17/2012)  .  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  . Noncompliance   . Atrial flutter   . Thrombocytopenia   . Hyperkalemia     type 4 RTA      Allergies:    Allergies  Allergen Reactions  . Digoxin And Related     Dig toxicity 2009  . Penicillins Rash      Procedures Performed This Admission:   None   Hospital Course:  Travis AHART Sr. is a 68 y.o. male with a history of atrial fib. He was symptomatic despite good HR control. He had been on Multaq but it was  discontinued his previous admission after he failed TEE/DCCV.   Dr. Caryl Comes evaluated [name] and reviewed all data. He was admitted for Tikosyn loading.   Parameters were met for starting Tikosyn and he was monitored carefully. His QTc did not prolong. He spontaneously converted to SR on 01/16. He maintained SR during the rest of his stay, with some pacing.  Tikosyn was held for hyperkalemia that was treated with kayexalate.  He had return of AFib while Tikosyn was on hold.  After K+ was corrected, he was placed back on Tikosyn with plans for DCCV.  However, he again had hyperkalemia (K+ 7) and Tikosyn was stopped.  He was seen by nephrology.  Persistent hyperkalemia was felt to be due to Type 4 RTA.  He was treated with Lasix and NaHCO3.  ACEI, ARB and Aldosterone were to be avoided.  UNa - 144 and UK+ - 52, Aldo < 1. ACTH stimulation proper response with normal AM serum cortisol, will need long term lasix/bicarb.  He will have labs followed by nephrology and follow up as an OP in a few weeks.  Rate control strategy for AFib was planned.  He was seen by Dr. Candee Furbish this AM and felt to be ready for DC to home.     Discharge Vitals:   Blood pressure 117/63, pulse 73, temperature 98.4 F (36.9 C), temperature source Oral, resp. rate 18, height 6' 3.2" (1.91 m), weight 147 lb 11.3 oz (67 kg), SpO2 99.00%.   Labs:   Recent Labs  06/10/13 1614 06/11/13 0638 06/12/13 0425  WBC 9.2 10.3 8.9  HGB 16.3 15.2 15.2  HCT 47.6 44.2 44.5  MCV 90.5 90.8 90.3  PLT 207 175 176     Recent Labs  06/10/13 1614 06/11/13 0628 06/12/13 0425  NA 141 143 145  K 4.9 4.7 4.8  CL 104 105 104  CO2 _0 BUN 29* 37* 41*  CREATININE 1.86* 1.91* 2.15*  CALCIUM 8.9 8.8 8.9     Lab Results  Component Value Date   TSH 1.999 05/24/2013     Recent Labs  06/10/13 0500 06/11/13 0638 06/12/13 0425  INR 2.32* 2.37* 2.23*   Serum Aldosterone:  < 1   Diagnostic Procedures and Studies:  Dg  Chest 2 View  06/02/2013     IMPRESSION: Scarring with evidence of prior empyema on the left. Underlying emphysema. No edema or consolidation.  Suspect nipple shadow left base; advise repeat study with nipple markers to confirm that this nodular opacity indeed represents a nipple shadow as opposed to a nodular lesion within the lung parenchyma.   Electronically Signed   By: Lowella Grip M.D.   On: 06/02/2013 15:06    Ct Chest Wo Contrast  06/02/2013  IMPRESSION: 1. No acute cardiopulmonary process. 2. Stable appearance of the lungs with left worse than right calcified pleural plaques and extensive pleural parenchymal scarring superimposed on a background of COPD and emphysema. 3. Atherosclerosis including coronary artery disease. 4. Cardiomegaly with left heart enlargement. 5. 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   Electronically Signed   By: [name] M.D.   On: 06/02/2013 17:18     Disposition:   Pt is being discharged home today in good condition.  Follow-up Plans & Appointments      Follow-up Information   Follow up with Louis Meckel, MD. Adelfa Koh, february 16th @ 10:30 AM)    Specialty:  Nephrology   Contact information:   [address] [place] [address] [phone]       Follow up with [facility]. (lab draw on Tuesday January 27th  @ 9 AM)    Contact information:   [address] [place] [address] [phone]       Follow up with Thompson Grayer, MD On 07/07/2013.   Specialty:  Cardiology   Contact information:   [address] [place] [address] [phone]       Schedule an appointment as soon as possible for a visit with Ceasar Lund, Eustaquio Boyden, [facility] (to check coumadin in 1 week)    Specialty:  Pharmacist   Contact information:   [place] [address] [phone]       Discharge Medications    Medication List    STOP taking these medications       diltiazem 30 MG  tablet  Commonly known as:  CARDIZEM     lisinopril 5 MG tablet  Commonly known as:  PRINIVIL,ZESTRIL      TAKE these medications       albuterol 108 (90 BASE) MCG/ACT inhaler  Commonly known as:  PROVENTIL HFA;VENTOLIN HFA  Inhale 2 puffs into the lungs every 6 (six) hours as needed for wheezing or shortness of breath.     buPROPion 75 MG tablet  Commonly known as:  WELLBUTRIN  Take 1 tablet (75 mg total) by mouth 2 (two) times daily.     diltiazem 240 MG 24 hr capsule  Commonly known as:  CARDIZEM CD  Take 1 capsule (240 mg total) by mouth 2 (two) times daily.     dronabinol 2.5 MG capsule  Commonly known as:  MARINOL  Take 1 capsule (2.5 mg total) by mouth 2 (two) times daily before lunch and supper.     furosemide 40 MG tablet  Commonly known as:  LASIX  Take 1 tablet (40 mg total) by mouth daily.     metoprolol succinate 100 MG 24 hr tablet  Commonly known as:  TOPROL-XL  Take 1 tablet (100 mg total) by mouth 2 (two) times daily.     omeprazole 20 MG capsule  Commonly known as:  PRILOSEC  Take 40 mg by mouth daily.     oxyCODONE-acetaminophen 10-325 MG per tablet  Commonly known as:  PERCOCET  Take 1 tablet by mouth every 6 (six) hours as needed for pain.     sodium bicarbonate 650 MG tablet  Take 2 tablets (1,300 mg total) by mouth 2 (two) times daily.     tiotropium 18 MCG inhalation capsule  Commonly known as:  SPIRIVA  Place 1 capsule (18 mcg total) into inhaler and inhale daily.     warfarin 5 MG tablet  Commonly known as:  COUMADIN  Take 2.5 tablets (12.5 mg total) by mouth daily.         Outstanding Labs/Studies  1. BMET 06/15/2013 at [facility]  Duration of Discharge Encounter: Greater than 30 minutes including physician and PA time.  Signed, Richardson Dopp, PA-C   06/12/2013 11:13 AM

## 2013-06-12 NOTE — Progress Notes (Signed)
Patient Name: Travis DASHNER Sr.      SUBJECTIVE:  Admitted with afib and attempts to use tikosyn were coincidental to marked elevation in K,  No specific cause was identified and attributed to CKD and RTA Tikosyn rarely associeated with this  Plan is rate control for afib  Feels better today and moving about. Ready to go.   Past Medical History  Diagnosis Date  . Hypertension   . CKD (chronic kidney disease)     Renal U/S 12/04/2009 showed no pathological findings. Labs 12/04/2009 include normal ESR, C3, C4; neg ANA; SPEP showed nonspecific increase in the alpha-2 region with no M-spike; UPEP showed no monoclonal free light chains; urine IFE showed polyclonal increase in feree Kappa and/or free Lambda light chains. Baseline Cr reported 1.7-2.5.  [name] Chronic systolic heart failure     a. NICM - EF 35% with normal cors 2003. b. Last echo 11/2012 - EF 25-30%.  . Depression   . GERD (gastroesophageal reflux disease)   . Portal vein thrombosis   . Avascular necrosis     right hip s/p replacement  . Gastritis   . Alcohol abuse   . Erectile dysfunction   . Bipolar disorder   . Hydrocele, unspecified   . Intermittent claudication   . Lung nodule     Chest CT scan on 12/14/2008 showed a nodular opacity at the left lung base felt to most likely represent scarring.  Follow-up chest CT scan on 06/02/2010 showed parenchymal scarring in the left apex, left  lower lobe, and lingula; some of this scarring at the left base had a nodular appearance, unchanged. Chest 09/2012 - stable.  . 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  Thyroid scan 08/28/2009 was normal with no focal areas of abnormal increased or decreased activity seen; the uptake of I 131 sodium iodide at 24 hours was 5.7%.  TSH and free T4 normalized by 08/16/2009.  [name] Pneumonia   . Intestinal obstruction   . COPD (chronic obstructive pulmonary disease)   . Clostridium difficile colitis   . E coli  bacteremia   . Cluster headaches   . DVT (deep venous thrombosis)     He has hypercoagulability with multiple prior DVTs  . Pleural plaque     H/o asbestos exposure. Chest CT on 06/02/2010 showed stable extensive calcified pleural plaques involving the left hemithorax, consistent with asbestos related pleural disease.  . Weight loss     Normal colonoscopy by Dr. Olevia Perches on 11/06/2010.  . Tachycardia-bradycardia syndrome     a. 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 b. St. Jude gen change 2010.  [name] PAF (paroxysmal atrial fibrillation)     Paroxysmal, failed medical therapy with amiodarone but has done very well with multaq  . Osteoarthritis   . Spondylosis   . Chronic low back pain     "cause of my hip" (03/17/2012)  . 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  . Noncompliance   . Atrial flutter   . Thrombocytopenia     Scheduled Meds:  Scheduled Meds: . buPROPion  75 mg Oral BID  . diltiazem  240 mg Oral BID  . dronabinol  2.5 mg Oral BID AC  . feeding supplement (RESOURCE BREEZE)  1 Container Oral BID BM  . furosemide  40 mg Oral Daily  . magnesium oxide  200 mg Oral Daily  .  metoprolol succinate  100 mg Oral BID  . nicotine  14 mg Transdermal Daily  . pantoprazole  40 mg Oral Daily  . sodium bicarbonate  1,300 mg Oral BID  . sodium chloride  3 mL Intravenous Q12H  . tiotropium  18 mcg Inhalation Daily  . Warfarin - Pharmacist Dosing Inpatient   Does not apply q1800   Continuous Infusions: . sodium chloride 100 mL/hr (06/03/13 1220)  . sodium chloride      PHYSICAL EXAM Filed Vitals:   06/11/13 1401 06/11/13 2112 06/12/13 0643 06/12/13 0735  BP: 104/70 94/66 118/78   Pulse: 88 78 75   Temp: 97.8 F (36.6 C) 97.9 F (36.6 C) 98.4 F (36.9 C)   TempSrc: Oral Oral Oral   Resp: _0 Height:      Weight:   147 lb 11.3 oz (67 kg)   SpO2: 95% 97% 99% 99%   Well  developed and nourished in no acute distress HENT normal Neck supple with JVP-flat Carotids brisk and full without bruits Clear Irregularly irregular rate and rhythm with normal rate.  2/6 early systolic murmur Abd-soft with active BS without hepatomegaly No Clubbing cyanosis edema Skin-warm and dry A & Oriented  Grossly normal sensory and motor function   TELEMETRY: Reviewed telemetry pt in SFib 90-120: mostly in the 90's now. Better.     Intake/Output Summary (Last 24 hours) at 06/12/13 0828 Last data filed at 06/12/13 0646  Gross per 24 hour  Intake    300 ml  Output   1300 ml  Net  -1000 ml    LABS: Basic Metabolic Panel:  Recent Labs Lab 06/07/13 1130 06/08/13 0500 06/09/13 0628  06/10/13 0500 06/10/13 1614 06/11/13 0628 06/12/13 0425  NA 138 140 139  --  142 141 143 145  K 5.8* 5.1 7.0*  --  6.4* 4.9 4.7 4.8  CL 105 107 105  --  104 104 105 104  CO2 _1 --  _2 GLUCOSE 99 97 99  --  111* 100* 89 97  BUN 30* 32* 27*  --  26* 29* 37* 41*  CREATININE 1.58* 1.67* 1.73*  --  1.78* 1.86* 1.91* 2.15*  CALCIUM 8.7 8.6 9.4  --  9.5 8.9 8.8 8.9  PHOS  --   --   --   < > 4.5  --  5.4* 4.8*  < > = values in this interval not displayed.  CBC:  Recent Labs Lab 06/06/13 0428 06/10/13 1614 06/11/13 0638 06/12/13 0425  WBC 9.4 9.2 10.3 8.9  HGB 14.0 16.3 15.2 15.2  HCT 42.9 47.6 44.2 44.5  MCV 92.9 90.5 90.8 90.3  PLT 180 207 175 176   PROTIME:  Recent Labs  06/10/13 0500 06/11/13 0638 06/12/13 0425  LABPROT 24.7* 25.1* 24.0*  INR 2.32* 2.37* 2.23*   Liver Function Tests:  Recent Labs  06/11/13 0628 06/12/13 0425  ALBUMIN 3.1* 3.4*   BNP: BNP (last 3 results)  Recent Labs  05/17/13 1225 05/22/13 1920 06/02/13 1232  PROBNP 6363.0* 9662.0* 3125.0*     Recent Labs  06/10/13 0915  TIBC 319  IRON 133       ASSESSMENT AND PLAN:  Principal Problem:   Atrial fibrillation Active Problems:   TOBACCO ABUSE    HYPERTENSION   Long-term (current) use of anticoagulants   Non-ischemic cardiomyopathy   Hyperkalemia   - OK to DC  - To leave with current increased  dose of diltiazem. On Toprol as well. Rate controlling AFIB.   - Warfarin  - Decrease lasix to 39m PO QD (creat increased)  - Has follow up with Dr. ARayann Heman2/18/15. Will need bmet on Monday and follow-up with nephrology in 3 weeks     Signed, SMarlou Porch [place]  06/12/2013

## 2013-06-12 NOTE — Progress Notes (Signed)
Nursing Note  Discharge instructions given to patient, with medication list and prescriptions given to patient. All questions answered will discharge home as ordered. Eretria Manternach, Bettina Gavia RN

## 2013-06-15 ENCOUNTER — Other Ambulatory Visit: Payer: Self-pay | Admitting: Internal Medicine

## 2013-06-15 ENCOUNTER — Telehealth: Payer: Self-pay | Admitting: *Deleted

## 2013-06-15 NOTE — Telephone Encounter (Signed)
Last refill 12/31,  Pt wants to get on Friday  Call when ready.

## 2013-06-15 NOTE — Telephone Encounter (Signed)
Spoke to pt at request of dr Marinda Elk, [facility] had spoken w/ pt but i repeated instructions to him and he repeated back  2 coumadin = 10mg  starting this evening and continuing until after pt/ inr is done Friday morning. Also he said to tell dr Marinda Elk not to forget his pain medicine because the due date is Saturday, i assured him he would not forget and repeated coumadin instructions once again  Note: when i spoke w/ [facility] she had told me inr was 3.5, i repeated back to her and spoke one other time and it was 3.5 when myself and dr Marinda Elk spoke to her she stated 2.5. She assured [place] that it was truly 2.5

## 2013-06-15 NOTE — Telephone Encounter (Signed)
HHN, vanessa calls to say she saw pt for restart of care since disch from hospital, he is on 12.5 of coumadin daily at the moment and she checked pt and inr- pt= 29.6 inr= 3.5, she needs orders for when to check next and what dosage he is to continue at, i have paged dr groce and i am sending to dr Marinda Elk, dr groce and attending

## 2013-06-15 NOTE — Telephone Encounter (Signed)
According to UpToDate, INR for portal thrombosis should be 2 -- 3.  He is slightly over.  He is on 2.5mg  tabs and apparantly on 12.5 each day.  Advise HHN to cut to 10mg  (4 tabs) a day on alternate days (i.e., 4 tabs alternating with 5 tabs).  Call Dr. Elie Confer in the next few days for revision of this regime.  If he remains unavailable, recheck INR in one week and call him with that result.

## 2013-06-15 NOTE — Telephone Encounter (Signed)
I spoke with home health nurse along with triage nurse by phone.  Home health nurse clarified that the point-of-care INR today was 2.5; she also said patient did not take his warfarin yesterday evening.  Following review of his inpatient dosing prior to discharge home on 1/24, I instructed her to have him take 2 tablets daily (10 mg) daily and I also instructed her to check his point-of-care INR Friday morning and call it to clinic.  We willl try to contact Dr. Elie Confer tomorrow to see if he has further instructions.

## 2013-06-16 ENCOUNTER — Other Ambulatory Visit: Payer: Self-pay | Admitting: Nephrology

## 2013-06-16 DIAGNOSIS — R339 Retention of urine, unspecified: Secondary | ICD-10-CM

## 2013-06-17 ENCOUNTER — Ambulatory Visit
Admission: RE | Admit: 2013-06-17 | Discharge: 2013-06-17 | Disposition: A | Discharge status: Home / Self Care | Payer: Commercial Managed Care - HMO | Source: Ambulatory Visit | Attending: Nephrology | Admitting: Nephrology

## 2013-06-17 DIAGNOSIS — R339 Retention of urine, unspecified: Secondary | ICD-10-CM

## 2013-06-18 ENCOUNTER — Other Ambulatory Visit (INDEPENDENT_AMBULATORY_CARE_PROVIDER_SITE_OTHER): Payer: Medicare HMO

## 2013-06-18 ENCOUNTER — Encounter: Payer: Self-pay | Admitting: *Deleted

## 2013-06-18 ENCOUNTER — Telehealth: Payer: Self-pay | Admitting: *Deleted

## 2013-06-18 DIAGNOSIS — Z7901 Long term (current) use of anticoagulants: Secondary | ICD-10-CM

## 2013-06-18 LAB — POCT INR: INR: 5.4

## 2013-06-18 MED ORDER — OXYCODONE-ACETAMINOPHEN 10-325 MG PO TABS: 1.0000 | ORAL_TABLET | Freq: Four times a day (QID) | ORAL | Status: DC | PRN

## 2013-06-18 NOTE — Telephone Encounter (Signed)
Patient is apparently coming in to pick up a prescription this afternoon.  Please have the lab check an INR when he arrives, and have him wait while they call me with the result so I can give further instructions regarding his warfarin dosing.

## 2013-06-18 NOTE — Telephone Encounter (Signed)
Refill printed and signed - nurse to complete. Note:  I had to reorder since the initial prescription did not print, nor did 2 attempts at reprinting.

## 2013-06-18 NOTE — Telephone Encounter (Signed)
Call from [place] at [facility] - states pt's today's PT - 61.2 and INR - 5.1 She states when pt was he was d/c from the hospital, he took Coumadin 12.5mg  x 2 days. And has been on Coumadin 10mg  Tues, Wed. and Thurs. She has spoken to Dr Marinda Elk previously. Also pt refused to have levels re-checked via venipuncture per their protocol.

## 2013-06-18 NOTE — Telephone Encounter (Signed)
Pt informed Rx is ready 

## 2013-06-18 NOTE — Patient Instructions (Signed)
INR  Date Value Range Status  06/18/2013 5.4   Final     Instructions for warfarin (Coumadin) 5 mg tablet dosing:  1. Do not take warfarin (Coumadin) today. 2. Tomorrow (Saturday 1/31) take 1 and 1/2 tablets for a total dose of 7.5 mg. 3. On Sunday 2/1, take 2 tablets for a total dose of 10 mg. 4. See Dr. Elie Confer in the anticoagulation clinic on Monday 06/21/2013. 5. Come in immediately if you have any signs of bleeding.

## 2013-06-18 NOTE — Telephone Encounter (Signed)
Called Lorriane Shire RN, [facility], to let her know pt's Coumadin schedule and will see Dr Elie Confer on Monday per Dr Marinda Elk.

## 2013-06-18 NOTE — Progress Notes (Signed)
Patient ID: Travis WITTY Sr., male   DOB: Sep 17, 1945, 68 y.o.   MRN: [id number]  Home health nurse called clinic this morning with point-of-care INR done today with value of 5.1.  When patient came in to clinic to pick up prescription this afternoon, we repeated the INR and found it to be 5.4.  I discussed patient by phone with Dr. Elie Confer who manages patient's warfarin in the anticoagulation clinic.  Based upon that discussion, I gave patient written instructions for the following warfarin dosing until he sees Dr. Elie Confer on Monday 06/21/2013:  INR  Date Value Range Status  06/18/2013 5.4   Final     Instructions for warfarin (Coumadin) 5 mg tablet dosing:  1. Do not take warfarin (Coumadin) today. 2. Tomorrow (Saturday 1/31) take 1 and 1/2 tablets for a total dose of 7.5 mg. 3. On Sunday 2/1, take 2 tablets for a total dose of 10 mg. 4. See Dr. Elie Confer in the anticoagulation clinic on Monday 06/21/2013. 5. Come in immediately if you have any signs of bleeding.

## 2013-06-18 NOTE — Telephone Encounter (Signed)
Pt arrived and received Rx.   INR done and reported to Dr Marinda Elk. Dr Marinda Elk down to talk with pt.

## 2013-06-21 ENCOUNTER — Ambulatory Visit: Payer: Medicare HMO

## 2013-06-21 ENCOUNTER — Telehealth: Payer: Self-pay | Admitting: *Deleted

## 2013-06-21 NOTE — Telephone Encounter (Signed)
Pt called to talk with Dr Elie Confer because he can not keep his appointment today. Dr Elie Confer asked pt to come in tomorrow and go to lab for INR draw.  Lab will page Dr Elie Confer with results.  Pt asked about todays dose of coumadin and Dr Elie Confer said to take the same dose he took on Sunday.  Pt informed.

## 2013-06-22 ENCOUNTER — Telehealth: Payer: Self-pay | Admitting: Pharmacist

## 2013-06-22 ENCOUNTER — Other Ambulatory Visit (INDEPENDENT_AMBULATORY_CARE_PROVIDER_SITE_OTHER): Payer: Medicare HMO

## 2013-06-22 DIAGNOSIS — Z7901 Long term (current) use of anticoagulants: Secondary | ICD-10-CM

## 2013-06-22 DIAGNOSIS — I81 Portal vein thrombosis: Secondary | ICD-10-CM

## 2013-06-22 LAB — POCT INR: INR: 3.1

## 2013-06-22 NOTE — Telephone Encounter (Signed)
I was called on Friday 30-JAN-15 while at [facility] I was on campus for a planned/scheduled [place]. Called by Dr. Marinda Elk. Dr. Marinda Elk had received an OP POC INR = 5.1, the dosing that had lead up to this INR was shared with me by Dr. Marinda Elk. Based upon the clinical decisions that had been made in the dosing efforts by the inpatient Pharmacy Service (patient had recently been hospitalized and dosing instructions provided to the patient at time of discharge), a decision to HOLD/OMIT Friday's dose of warfarin was agreed upon by Dr. Marinda Elk and myself. Patient was provided written AVS to HOLD Friday's dose; take 1& 1/2 x 5mg  (7.5mg ) warfarin on Saturday; take 2x5mg  (10mg ) warfarin on Sunday and come to [facility] on Monday 2-FEB-15. Patient did NOT keep his appointment on Monday 2-FEB-15 but instead called the [facility] and spoke with the Triage Nurse Edd Fabian. He asked what dose of warfarin to take on Monday. Not being able to see patient and make a clinical decision based upon INR response, I had to empirically provide a dosing recommendation to patient which was conveyed to patient by Triage Nurse. He arrives today to [facility] whereupon INR by fingerstick reveals INR = 3.1 after 0mg /7.5mg /10mg /0mg  (17.5mg  over 4 days--weighted daily average = 4.35mg /day). He has been instructed to take his warfarin in the following manner:  Today, take 1x5mg ; Wednesday--take 1&1/2 x 5mg  = 7.5mg  warfarin; on Thursday--take 1x5mg  warfarin; on Friday--take 1x5mg  warfarin; on Saturday take 1&1/2x5mg  = 7.5mg  warfarin; on Sunday take 1&1/2x5mg  = 7.5mg  warfarin. On Monday 9-FEB-15 come to [facility] at 4:30PM for repeat INR by fingerstick. He acknowledges his understanding of these instructions--a printed copy of which he was provided. Usual instructions relating to bleeding:  Report to the ED if any bleeding were to occur.

## 2013-06-28 ENCOUNTER — Ambulatory Visit (INDEPENDENT_AMBULATORY_CARE_PROVIDER_SITE_OTHER): Payer: Commercial Managed Care - HMO | Admitting: Pharmacist

## 2013-06-28 DIAGNOSIS — I81 Portal vein thrombosis: Secondary | ICD-10-CM

## 2013-06-28 DIAGNOSIS — Z7901 Long term (current) use of anticoagulants: Secondary | ICD-10-CM

## 2013-06-28 DIAGNOSIS — I4891 Unspecified atrial fibrillation: Secondary | ICD-10-CM

## 2013-06-28 LAB — POCT INR: INR: 2.1

## 2013-06-28 MED ORDER — WARFARIN SODIUM 5 MG PO TABS: ORAL_TABLET | ORAL | Status: DC

## 2013-06-28 NOTE — Progress Notes (Signed)
Anti-Coagulation Progress Note  [name]. is a 68 y.o. male who is currently on an anti-coagulation regimen.    RECENT RESULTS: Recent results are below, the most recent result is correlated with a dose of 45 mg. per week: Lab Results  Component Value Date   INR 2.10 06/28/2013   INR 3.1 06/22/2013   INR 5.4 06/18/2013    ANTI-COAG DOSE: Anticoagulation Dose Instructions as of 06/28/2013     [name] Tue Wed Thu Fri Sat   New Dose 7.5 mg 7.5 mg 7.5 mg 5 mg 7.5 mg 5 mg 7.5 mg       ANTICOAG SUMMARY: Anticoagulation Episode Summary   Current INR goal   Next INR check 07/05/2013  INR from last check 2.10 (06/28/2013)  Weekly max dose   Target end date   INR check location   Preferred lab   Send INR reminders to ANTICOAG IMP   Indications  Long-term (current) use of anticoagulants [V58.61] Atrila fibrillation (Resolved) [427.31] Thrombosis 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]        Comments       Anticoagulation Care Providers   Provider Role Specialty Phone number   Axel Filler, MD  Internal Medicine [phone]      ANTICOAG TODAY: Anticoagulation Summary as of 06/28/2013   INR goal   Selected INR 2.10 (06/28/2013)  Next INR check 07/05/2013  Target end date    Indications  Long-term (current) use of anticoagulants [V58.61] Atrila fibrillation (Resolved) [427.31] Thrombosis 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]      Anticoagulation Episode Summary   INR check location    Preferred lab    Send INR reminders to ANTICOAG IMP   Comments     Anticoagulation Care Providers   Provider Role Specialty Phone number   Axel Filler, MD  Internal Medicine [phone]      PATIENT INSTRUCTIONS: Patient Instructions  Patient instructed to take medications as defined in the Anti-coagulation Track section of this encounter.  Patient instructed to take today's dose.  Patient verbalized understanding of these instructions.       FOLLOW-UP Return in 7 days (on 07/05/2013) for Follow up INR  at 4:30PM.  Jorene Guest, III Pharm.D., CACP

## 2013-06-28 NOTE — Patient Instructions (Signed)
Patient instructed to take medications as defined in the Anti-coagulation Track section of this encounter.  Patient instructed to take today's dose.  Patient verbalized understanding of these instructions.    

## 2013-07-02 ENCOUNTER — Encounter (HOSPITAL_COMMUNITY): Payer: Self-pay | Admitting: General Practice

## 2013-07-02 ENCOUNTER — Ambulatory Visit: Payer: Medicare HMO | Admitting: Internal Medicine

## 2013-07-02 ENCOUNTER — Ambulatory Visit (INDEPENDENT_AMBULATORY_CARE_PROVIDER_SITE_OTHER): Payer: Medicare HMO | Admitting: Internal Medicine

## 2013-07-02 ENCOUNTER — Observation Stay (HOSPITAL_COMMUNITY)
Admission: AD | Admit: 2013-07-02 | Discharge: 2013-07-04 | Disposition: A | Discharge status: Home / Self Care | Payer: Medicare HMO | Source: Ambulatory Visit | Attending: Internal Medicine | Admitting: Internal Medicine

## 2013-07-02 ENCOUNTER — Encounter: Payer: Self-pay | Admitting: Internal Medicine

## 2013-07-02 ENCOUNTER — Ambulatory Visit (HOSPITAL_COMMUNITY)
Admission: RE | Admit: 2013-07-02 | Discharge: 2013-07-02 | Disposition: A | Discharge status: Home / Self Care | Payer: Medicare HMO | Source: Ambulatory Visit | Attending: Internal Medicine | Admitting: Internal Medicine

## 2013-07-02 VITALS — BP 104/78 | HR 75 | Temp 97.3°F | Wt 158.1 lb

## 2013-07-02 DIAGNOSIS — R079 Chest pain, unspecified: Secondary | ICD-10-CM

## 2013-07-02 DIAGNOSIS — G8929 Other chronic pain: Secondary | ICD-10-CM | POA: Insufficient documentation

## 2013-07-02 DIAGNOSIS — M1990 Unspecified osteoarthritis, unspecified site: Secondary | ICD-10-CM | POA: Insufficient documentation

## 2013-07-02 DIAGNOSIS — Z79899 Other long term (current) drug therapy: Secondary | ICD-10-CM | POA: Insufficient documentation

## 2013-07-02 DIAGNOSIS — I129 Hypertensive chronic kidney disease with stage 1 through stage 4 chronic kidney disease, or unspecified chronic kidney disease: Secondary | ICD-10-CM | POA: Insufficient documentation

## 2013-07-02 DIAGNOSIS — N189 Chronic kidney disease, unspecified: Secondary | ICD-10-CM | POA: Diagnosis present

## 2013-07-02 DIAGNOSIS — I5042 Chronic combined systolic (congestive) and diastolic (congestive) heart failure: Secondary | ICD-10-CM | POA: Diagnosis present

## 2013-07-02 DIAGNOSIS — N179 Acute kidney failure, unspecified: Secondary | ICD-10-CM | POA: Diagnosis present

## 2013-07-02 DIAGNOSIS — Z862 Personal history of diseases of the blood and blood-forming organs and certain disorders involving the immune mechanism: Secondary | ICD-10-CM | POA: Insufficient documentation

## 2013-07-02 DIAGNOSIS — Z87448 Personal history of other diseases of urinary system: Secondary | ICD-10-CM | POA: Insufficient documentation

## 2013-07-02 DIAGNOSIS — M479 Spondylosis, unspecified: Secondary | ICD-10-CM | POA: Insufficient documentation

## 2013-07-02 DIAGNOSIS — Z8701 Personal history of pneumonia (recurrent): Secondary | ICD-10-CM | POA: Insufficient documentation

## 2013-07-02 DIAGNOSIS — Z9119 Patient's noncompliance with other medical treatment and regimen: Secondary | ICD-10-CM | POA: Insufficient documentation

## 2013-07-02 DIAGNOSIS — R0789 Other chest pain: Principal | ICD-10-CM | POA: Insufficient documentation

## 2013-07-02 DIAGNOSIS — F411 Generalized anxiety disorder: Secondary | ICD-10-CM | POA: Insufficient documentation

## 2013-07-02 DIAGNOSIS — Z8619 Personal history of other infectious and parasitic diseases: Secondary | ICD-10-CM | POA: Insufficient documentation

## 2013-07-02 DIAGNOSIS — E875 Hyperkalemia: Secondary | ICD-10-CM

## 2013-07-02 DIAGNOSIS — IMO0002 Reserved for concepts with insufficient information to code with codable children: Secondary | ICD-10-CM | POA: Insufficient documentation

## 2013-07-02 DIAGNOSIS — E0590 Thyrotoxicosis, unspecified without thyrotoxic crisis or storm: Secondary | ICD-10-CM | POA: Insufficient documentation

## 2013-07-02 DIAGNOSIS — Z950 Presence of cardiac pacemaker: Secondary | ICD-10-CM | POA: Insufficient documentation

## 2013-07-02 DIAGNOSIS — I5022 Chronic systolic (congestive) heart failure: Secondary | ICD-10-CM | POA: Insufficient documentation

## 2013-07-02 DIAGNOSIS — K219 Gastro-esophageal reflux disease without esophagitis: Secondary | ICD-10-CM | POA: Insufficient documentation

## 2013-07-02 DIAGNOSIS — Z9889 Other specified postprocedural states: Secondary | ICD-10-CM | POA: Insufficient documentation

## 2013-07-02 DIAGNOSIS — I482 Chronic atrial fibrillation, unspecified: Secondary | ICD-10-CM | POA: Diagnosis present

## 2013-07-02 DIAGNOSIS — Z86718 Personal history of other venous thrombosis and embolism: Secondary | ICD-10-CM | POA: Insufficient documentation

## 2013-07-02 DIAGNOSIS — J449 Chronic obstructive pulmonary disease, unspecified: Secondary | ICD-10-CM | POA: Insufficient documentation

## 2013-07-02 DIAGNOSIS — Z87891 Personal history of nicotine dependence: Secondary | ICD-10-CM | POA: Diagnosis present

## 2013-07-02 DIAGNOSIS — I749 Embolism and thrombosis of unspecified artery: Secondary | ICD-10-CM | POA: Insufficient documentation

## 2013-07-02 DIAGNOSIS — F319 Bipolar disorder, unspecified: Secondary | ICD-10-CM | POA: Insufficient documentation

## 2013-07-02 DIAGNOSIS — J441 Chronic obstructive pulmonary disease with (acute) exacerbation: Secondary | ICD-10-CM | POA: Diagnosis present

## 2013-07-02 DIAGNOSIS — I4891 Unspecified atrial fibrillation: Secondary | ICD-10-CM | POA: Insufficient documentation

## 2013-07-02 DIAGNOSIS — J4489 Other specified chronic obstructive pulmonary disease: Secondary | ICD-10-CM | POA: Insufficient documentation

## 2013-07-02 DIAGNOSIS — Z9981 Dependence on supplemental oxygen: Secondary | ICD-10-CM | POA: Insufficient documentation

## 2013-07-02 DIAGNOSIS — Z91199 Patient's noncompliance with other medical treatment and regimen due to unspecified reason: Secondary | ICD-10-CM | POA: Insufficient documentation

## 2013-07-02 DIAGNOSIS — Z7901 Long term (current) use of anticoagulants: Secondary | ICD-10-CM | POA: Insufficient documentation

## 2013-07-02 HISTORY — DX: Anxiety disorder, unspecified: F41.9

## 2013-07-02 HISTORY — DX: Dependence on supplemental oxygen: Z99.81

## 2013-07-02 LAB — TROPONIN I
Troponin I: <0.3 ng/mL (ref <0.30)
Troponin I: <0.3 ng/mL (ref <0.30)

## 2013-07-02 LAB — CBC
HEMATOCRIT: 40.6 % (ref 39.0–52.0)
Hemoglobin: 13.7 g/dL (ref 13.0–17.0)
MCH: 30.9 pg (ref 26.0–34.0)
MCHC: 33.7 g/dL (ref 30.0–36.0)
MCV: 91.6 fL (ref 78.0–100.0)
Platelets: 151 10*3/uL (ref 150–400)
RBC: 4.43 MIL/uL (ref 4.22–5.81)
RDW: 14.8 % (ref 11.5–15.5)
WBC: 8.7 10*3/uL (ref 4.0–10.5)

## 2013-07-02 LAB — BASIC METABOLIC PANEL WITH GFR
BUN: 22 mg/dL (ref 6–23)
CO2: 26 mEq/L (ref 19–32)
CREATININE: 2.13 mg/dL — AB (ref 0.50–1.35)
Calcium: 9.7 mg/dL (ref 8.4–10.5)
Chloride: 106 mEq/L (ref 96–112)
GFR, EST AFRICAN AMERICAN: 35 mL/min — AB
GFR, EST NON AFRICAN AMERICAN: 31 mL/min — AB
GLUCOSE: 105 mg/dL — AB (ref 70–99)
Potassium: 5.3 mEq/L (ref 3.5–5.3)
Sodium: 145 mEq/L (ref 135–145)

## 2013-07-02 MED ORDER — DILTIAZEM HCL ER COATED BEADS 240 MG PO CP24
240.0000 mg | ORAL_CAPSULE | Freq: Two times a day (BID) | ORAL | Status: DC
  Administered 2013-07-02 – 2013-07-04 (×4): 240 mg via ORAL
  Filled 2013-07-02 (×5): qty 1

## 2013-07-02 MED ORDER — SODIUM BICARBONATE 650 MG PO TABS
1300.0000 mg | ORAL_TABLET | Freq: Two times a day (BID) | ORAL | Status: DC
  Administered 2013-07-02 – 2013-07-04 (×4): 1300 mg via ORAL
  Filled 2013-07-02 (×5): qty 2

## 2013-07-02 MED ORDER — ASPIRIN 81 MG PO CHEW
81.0000 mg | CHEWABLE_TABLET | Freq: Every day | ORAL | Status: DC
  Administered 2013-07-04: 81 mg via ORAL
  Filled 2013-07-02 (×3): qty 1

## 2013-07-02 MED ORDER — METOPROLOL SUCCINATE ER 100 MG PO TB24
100.0000 mg | ORAL_TABLET | Freq: Two times a day (BID) | ORAL | Status: DC
  Administered 2013-07-02: 100 mg via ORAL
  Filled 2013-07-02 (×3): qty 1

## 2013-07-02 MED ORDER — OXYCODONE HCL 5 MG PO TABS
5.0000 mg | ORAL_TABLET | Freq: Four times a day (QID) | ORAL | Status: DC | PRN
  Administered 2013-07-03 – 2013-07-04 (×3): 5 mg via ORAL
  Filled 2013-07-02 (×3): qty 1

## 2013-07-02 MED ORDER — OXYCODONE-ACETAMINOPHEN 10-325 MG PO TABS: 1.0000 | ORAL_TABLET | Freq: Four times a day (QID) | ORAL | Status: DC | PRN

## 2013-07-02 MED ORDER — SODIUM CHLORIDE 0.9 % IJ SOLN
3.0000 mL | Freq: Two times a day (BID) | INTRAMUSCULAR | Status: DC
  Administered 2013-07-02 – 2013-07-04 (×4): 3 mL via INTRAVENOUS

## 2013-07-02 MED ORDER — TIOTROPIUM BROMIDE MONOHYDRATE 18 MCG IN CAPS
18.0000 ug | ORAL_CAPSULE | Freq: Every day | RESPIRATORY_TRACT | Status: DC
  Administered 2013-07-03 – 2013-07-04 (×2): 18 ug via RESPIRATORY_TRACT
  Filled 2013-07-02: qty 5

## 2013-07-02 MED ORDER — ALBUTEROL SULFATE HFA 108 (90 BASE) MCG/ACT IN AERS: 2.0000 | INHALATION_SPRAY | Freq: Four times a day (QID) | RESPIRATORY_TRACT | Status: DC | PRN

## 2013-07-02 MED ORDER — BUPROPION HCL 75 MG PO TABS
75.0000 mg | ORAL_TABLET | Freq: Two times a day (BID) | ORAL | Status: DC
  Administered 2013-07-02 – 2013-07-04 (×4): 75 mg via ORAL
  Filled 2013-07-02 (×5): qty 1

## 2013-07-02 MED ORDER — WARFARIN - PHARMACIST DOSING INPATIENT: Freq: Every day | Status: DC

## 2013-07-02 MED ORDER — ENOXAPARIN SODIUM 30 MG/0.3ML ~~LOC~~ SOLN
30.0000 mg | SUBCUTANEOUS | Status: DC
  Administered 2013-07-02: 30 mg via SUBCUTANEOUS
  Filled 2013-07-02: qty 0.3

## 2013-07-02 MED ORDER — OXYCODONE-ACETAMINOPHEN 5-325 MG PO TABS
1.0000 | ORAL_TABLET | Freq: Four times a day (QID) | ORAL | Status: DC | PRN
  Administered 2013-07-03 – 2013-07-04 (×4): 1 via ORAL
  Filled 2013-07-02 (×4): qty 1

## 2013-07-02 MED ORDER — PANTOPRAZOLE SODIUM 40 MG PO TBEC
40.0000 mg | DELAYED_RELEASE_TABLET | Freq: Every day | ORAL | Status: DC
  Administered 2013-07-03 – 2013-07-04 (×2): 40 mg via ORAL
  Filled 2013-07-02 (×2): qty 1

## 2013-07-02 MED ORDER — SENNA 8.6 MG PO TABS
1.0000 | ORAL_TABLET | Freq: Two times a day (BID) | ORAL | Status: DC
  Administered 2013-07-02 – 2013-07-04 (×4): 8.6 mg via ORAL
  Filled 2013-07-02 (×5): qty 1

## 2013-07-02 MED ORDER — WARFARIN SODIUM 5 MG PO TABS
5.0000 mg | ORAL_TABLET | Freq: Once | ORAL | Status: AC
  Filled 2013-07-02: qty 1

## 2013-07-02 NOTE — Assessment & Plan Note (Addendum)
Patient has atypical chest pain. EKG showed A fib with RVR. Costochondritis or musculoskeletal pain may have contributed at least partially to his chest pain, given the reproducible chest pain over the left upper chest wall. Pulmonary embolism is unlikely given recent therapeutic INR. The patient has significant co-morbidities, including A. fib with RVR, old age, hypertension, congestive heart failure and CKD, patient needs to be observed in hospital and rule out ACS per Dr. Stann Mainland. Currently patient is hemodynamically stable. His chest pain is mild.  -poc trop ordered, but the results have not come back yet -will call admission team for admission to tel bed.

## 2013-07-02 NOTE — Progress Notes (Signed)
ANTICOAGULATION CONSULT NOTE - Initial Consult  Pharmacy Consult for Coumadin Indication: history of atrial fibrillation   Allergies  Allergen Reactions  . Digoxin And Related     Dig toxicity 2009  . Penicillins Rash    Patient Measurements: Height: '6\' 3"'  (190.5 cm) (taken from 06/02/13 record) Weight: 158 lb 1.6 oz (71.714 kg) (from office visit 07/02/13, taken at 16:57) IBW/kg (Calculated) : 84.5   Vital Signs: Temp: 98.5 F (36.9 C) (02/13 2203) Temp src: Oral (02/13 2203) BP: 97/68 mmHg (02/13 2203) Pulse Rate: 56 (02/13 2203)  Labs:  Recent Labs  07/02/13 1613 07/02/13 1620 07/02/13 2140  HGB  --   --  13.7  HCT  --   --  40.6  PLT  --   --  151  CREATININE  --  2.13*  --   TROPONINI <0.30  --   --     Estimated Creatinine Clearance: 33.2 ml/min (by C-G formula based on Cr of 2.13).   Medical History: Past Medical History  Diagnosis Date  . Hypertension   . CKD (chronic kidney disease)     Renal U/S 12/04/2009 showed no pathological findings. Labs 12/04/2009 include normal ESR, C3, C4; neg ANA; SPEP showed nonspecific increase in the alpha-2 region with no M-spike; UPEP showed no monoclonal free light chains; urine IFE showed polyclonal increase in feree Kappa and/or free Lambda light chains. Baseline Cr reported 1.7-2.5.  [name] Chronic systolic heart failure     a. NICM - EF 35% with normal cors 2003. b. Last echo 11/2012 - EF 25-30%.  . Depression   . GERD (gastroesophageal reflux disease)   . Portal vein thrombosis   . Avascular necrosis     right hip s/p replacement  . Gastritis   . Alcohol abuse   . Erectile dysfunction   . Bipolar disorder   . Hydrocele, unspecified   . Intermittent claudication   . Lung nodule     Chest CT scan on 12/14/2008 showed a nodular opacity at the left lung base felt to most likely represent scarring.  Follow-up chest CT scan on 06/02/2010 showed parenchymal scarring in the left apex, left  lower lobe, and lingula; some of  this scarring at the left base had a nodular appearance, unchanged. Chest 09/2012 - stable.  . 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  Thyroid scan 08/28/2009 was normal with no focal areas of abnormal increased or decreased activity seen; the uptake of I 131 sodium iodide at 24 hours was 5.7%.  TSH and free T4 normalized by 08/16/2009.  . Intestinal obstruction   . COPD (chronic obstructive pulmonary disease)   . Clostridium difficile colitis   . E coli bacteremia   . DVT (deep venous thrombosis)     He has hypercoagulability with multiple prior DVTs  . Pleural plaque     H/o asbestos exposure. Chest CT on 06/02/2010 showed stable extensive calcified pleural plaques involving the left hemithorax, consistent with asbestos related pleural disease.  . Weight loss     Normal colonoscopy by Dr. Olevia Perches on 11/06/2010.  . Tachycardia-bradycardia syndrome     a. 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 b. St. Jude gen change 2010.  [name] PAF (paroxysmal atrial fibrillation)     Paroxysmal, failed medical therapy with amiodarone but has done very well with multaq;  d/c 2/2 to recurrent AFib;  Tikosyn load c/b Hyperkalemia during admx 05/2013 => rate control strategy  . 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  . Noncompliance   . Atrial flutter   . Thrombocytopenia   . Hyperkalemia     type 4 RTA  . Pacemaker   . Shortness of breath     "all the time now" (07/02/2013)  . On home oxygen therapy     "2L prn" (07/02/2013)  . Pneumonia   . Cluster headaches     "~ twice/wk" (07/02/2013)  . Osteoarthritis   . Spondylosis   . Arthritis     "hips and legs" (07/02/2013)  . Chronic back pain     "all over my back" (07/02/2013)  . Anxiety     Medications:  Prescriptions prior to admission  Medication Sig Dispense Refill  . albuterol (PROVENTIL HFA;VENTOLIN HFA)  108 (90 BASE) MCG/ACT inhaler Inhale 2 puffs into the lungs every 6 (six) hours as needed for wheezing or shortness of breath.      [name] buPROPion (WELLBUTRIN) 75 MG tablet Take 1 tablet (75 mg total) by mouth 2 (two) times daily.  60 tablet  5  . diltiazem (CARDIZEM CD) 240 MG 24 hr capsule Take 1 capsule (240 mg total) by mouth 2 (two) times daily.  60 capsule  5  . dronabinol (MARINOL) 2.5 MG capsule Take 1 capsule (2.5 mg total) by mouth 2 (two) times daily before lunch and supper.  60 capsule  0  . hydrocortisone ointment 0.5 % Apply 1 application topically 2 (two) times daily as needed for itching.      . metoprolol succinate (TOPROL-XL) 100 MG 24 hr tablet Take 1 tablet (100 mg total) by mouth 2 (two) times daily.  180 tablet  3  . omeprazole (PRILOSEC) 20 MG capsule Take 40 mg by mouth daily.      [name] oxyCODONE-acetaminophen (PERCOCET) 10-325 MG per tablet Take 1 tablet by mouth every 6 (six) hours as needed for pain.  120 tablet  0  . sodium bicarbonate 650 MG tablet Take 2 tablets (1,300 mg total) by mouth 2 (two) times daily.  120 tablet  5  . tiotropium (SPIRIVA) 18 MCG inhalation capsule Place 1 capsule (18 mcg total) into inhaler and inhale daily.  30 capsule  11  . warfarin (COUMADIN) 5 MG tablet Take 5-7.5 mg by mouth every evening. Takes 9m on 5 days, and 7.572mon two days.       Scheduled:  . aspirin  81 mg Oral Daily  . buPROPion  75 mg Oral BID  . diltiazem  240 mg Oral BID  . metoprolol succinate  100 mg Oral BID  . [START ON 07/03/2013] pantoprazole  40 mg Oral Daily  . senna  1 tablet Oral BID  . sodium bicarbonate  1,300 mg Oral BID  . sodium chloride  3 mL Intravenous Q12H  . [START ON 07/03/2013] tiotropium  18 mcg Inhalation Daily    Assessment: [age].o male admitted tonight for chest pain. EKG in clinic showed A. fib with RVR with heart rate of 142/min. The patient has a h/o atrial fibrillation and is on chronic coumadin.  His PTA coumadin dose per anticoagulation  clinic pharmacist's note on 06/28/13 is 7.5 mg daily except 75m19mWed and Friday. INR on 06/28/13 was 2.1, therapeutic.  Patient reports he last took his coumadin yesterday  07/01/13 per the medication reconciliation review.  Dose due today is 5 mg. MD notes patient denies hematuria and no other bleeding  reported.   Goal of Therapy:  INR 2-3 Monitor platelets by anticoagulation protocol: Yes   Plan:  Coumadin 5 mg po tonight x1 INR daily  Nicole Cella, RPh Clinical Pharmacist Pager: [phone]  07/02/2013,10:48 PM

## 2013-07-02 NOTE — H&P (Signed)
Date: 07/02/2013               Patient Name:  [name] MRN: [id number]  DOB: 12-15-1945 Age / Sex: 68 y.o., male   PCP: Axel Filler, MD         Medical Service: Internal Medicine Teaching Service         Attending Physician: Dr. Madilyn Fireman, MD    First Contact: Dr. Ronnald Ramp Pager: [phone]  Second Contact: Dr. Eulas Post Pager: [phone]       After Hours (After 5p/  First Contact Pager: [phone]  weekends / holidays): Second Contact Pager: [phone]   Chief Complaint: Chest pain  History of Present Illness: 70 y o Male with signif PMH- Afib with RVR, COPD, Hyperthyr, Depression, HTN, CHF, CKD, intermittent claudication, avascular necrosis and portal vein thrombosis. Presented with Chest pain of 2 days duration, left chest region, started while he was watching TV, Intermittent- each episode lasting about 63mns, described as pressure like pain, non radiating. Pain when present is worse with movement, but relieved by rest. Mother had heart problems in her 457s but doesn't know what kind. No assoc SOB, diaphoresis, no fever and no cough. Currently chest pain free.  Pt quit smoking 3 weeks ago, smoked 1 pack a day for the past 10-15 years. Doesn't take a daily aspirin. Pt has afib with RVR, is on Coumadin and claims compliance, Pt also has a hx of  Blood clots in his legs.  Meds: Current Facility-Administered Medications  Medication Dose Route Frequency Provider Last Rate Last Dose  . aspirin chewable tablet 81 mg  81 mg Oral Daily EOlga Millers MD      . buPROPion ([facility] tablet 75 mg  75 mg Oral BID EOlga Millers MD   75 mg at 07/02/13 2107  . diltiazem (CARDIZEM CD) 24 hr capsule 240 mg  240 mg Oral BID EOlga Millers MD   240 mg at 07/02/13 2107  . metoprolol succinate (TOPROL-XL) 24 hr tablet 100 mg  100 mg Oral BID EOlga Millers MD   100 mg at 07/02/13 2107  . oxyCODONE-acetaminophen (PERCOCET/ROXICET) 5-325 MG per tablet 1 tablet  1 tablet Oral Q6H  PRN SMadilyn Fireman MD       And  . oxyCODONE (Oxy IR/ROXICODONE) immediate release tablet 5 mg  5 mg Oral Q6H PRN SMadilyn Fireman MD      . [Derrill MemoON 07/03/2013] pantoprazole (PROTONIX) EC tablet 40 mg  40 mg Oral Daily EOlga Millers MD      . sJordan Hawks([facility] tablet 8.6 mg  1 tablet Oral BID EOlga Millers MD   8.6 mg at 07/02/13 2107  . sodium bicarbonate tablet 1,300 mg  1,300 mg Oral BID EOlga Millers MD   1,300 mg at 07/02/13 2107  . sodium chloride 0.9 % injection 3 mL  3 mL Intravenous Q12H EOlga Millers MD   3 mL at 07/02/13 2107  . [START ON 07/03/2013] tiotropium (SPIRIVA) inhalation capsule 18 mcg  18 mcg Inhalation Daily EOlga Millers MD      . [Derrill MemoON 07/03/2013] warfarin (COUMADIN) tablet 5 mg  5 mg Oral ONCE-1800 [facility]rm[facility]     . [START ON 07/03/2013] Warfarin - Pharmacist Dosing Inpatient   Does not [facility]       Allergies: Allergies as of 07/02/2013 - Review Complete 07/02/2013  Allergen Reaction Noted  . Digoxin and  related  05/17/2013  . Penicillins Rash    Past Medical History  Diagnosis Date  . Hypertension   . CKD (chronic kidney disease)     Renal U/S 12/04/2009 showed no pathological findings. Labs 12/04/2009 include normal ESR, C3, C4; neg ANA; SPEP showed nonspecific increase in the alpha-2 region with no M-spike; UPEP showed no monoclonal free light chains; urine IFE showed polyclonal increase in feree Kappa and/or free Lambda light chains. Baseline Cr reported 1.7-2.5.  [name] Chronic systolic heart failure     a. NICM - EF 35% with normal cors 2003. b. Last echo 11/2012 - EF 25-30%.  . Depression   . GERD (gastroesophageal reflux disease)   . Portal vein thrombosis   . Avascular necrosis     right hip s/p replacement  . Gastritis   . Alcohol abuse   . Erectile dysfunction   . Bipolar disorder   . Hydrocele, unspecified   . Intermittent claudication   . Lung nodule     Chest CT scan on  12/14/2008 showed a nodular opacity at the left lung base felt to most likely represent scarring.  Follow-up chest CT scan on 06/02/2010 showed parenchymal scarring in the left apex, left  lower lobe, and lingula; some of this scarring at the left base had a nodular appearance, unchanged. Chest 09/2012 - stable.  . 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  Thyroid scan 08/28/2009 was normal with no focal areas of abnormal increased or decreased activity seen; the uptake of I 131 sodium iodide at 24 hours was 5.7%.  TSH and free T4 normalized by 08/16/2009.  . Intestinal obstruction   . COPD (chronic obstructive pulmonary disease)   . Clostridium difficile colitis   . E coli bacteremia   . DVT (deep venous thrombosis)     He has hypercoagulability with multiple prior DVTs  . Pleural plaque     H/o asbestos exposure. Chest CT on 06/02/2010 showed stable extensive calcified pleural plaques involving the left hemithorax, consistent with asbestos related pleural disease.  . Weight loss     Normal colonoscopy by Dr. Olevia Perches on 11/06/2010.  . Tachycardia-bradycardia syndrome     a. 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 b. St. Jude gen change 2010.  [name] PAF (paroxysmal atrial fibrillation)     Paroxysmal, failed medical therapy with amiodarone but has done very well with multaq;  d/c 2/2 to recurrent AFib;  Tikosyn load c/b Hyperkalemia during admx 05/2013 => rate control strategy  . 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  . Noncompliance   . Atrial flutter   . Thrombocytopenia   . Hyperkalemia     type 4 RTA  . Pacemaker   . Shortness of breath     "all the time now" (07/02/2013)  . On home oxygen therapy     "2L prn" (07/02/2013)  . Pneumonia   . Cluster headaches     "~ twice/wk" (07/02/2013)  . Osteoarthritis   . Spondylosis   . Arthritis     "hips and  legs" (07/02/2013)  . Chronic back pain     "all over my back" (07/02/2013)  . Anxiety    Past Surgical History  Procedure Laterality Date  . Hemiarthroplasty hip Left 09/09/2002    bipolar; Dr. Lafayette Dragon  . Pacemaker insertion  02/2003    Dr. Roe Rutherford, [place]  pulse generator identity SR model [id number] serial [id number]; gen change by Greggory Brandy  . Total hip arthroplasty Right 03/08/2008     By Dr. Hiram Comber  . Tee without cardioversion N/A 05/27/2013    Procedure: TRANSESOPHAGEAL ECHOCARDIOGRAM (TEE);  Surgeon: Candee Furbish, MD;  Location: [facility] ENDOSCOPY;  Service: Cardiovascular;  Laterality: N/A;  . Cardioversion N/A 05/27/2013    Procedure: CARDIOVERSION;  Surgeon: Candee Furbish, MD;  Location: [facility] ENDOSCOPY;  Service: Cardiovascular;  Laterality: N/A;  . Cardiac catheterization  2003    Nl cors, EF 35%  . Pacemaker placement  06/17/2008    Lead and generator change w/ St. Jude Medical Tendril ST model [id number] (serial  [id number]).       . Insert / replace / remove pacemaker     Family History  Problem Relation Age of Onset  . Hypertension Mother   . Cancer Brother     Unsure of type.  . Asthma Mother   . Coronary artery disease Mother   . Colon cancer Neg Hx   . Lung cancer Neg Hx   . Prostate cancer Neg Hx    History   Social History  . Marital Status: Widowed    Spouse Name: N/A    Number of Children: 10  . Years of Education: N/A   Occupational History  . RETIRED    Social History Main Topics  . Smoking status: Former Smoker -- 1.50 packs/day for 54 years    Types: Cigarettes    Quit date: 06/08/2013  . Smokeless tobacco: Never Used  . Alcohol Use: Yes     Comment: 07/02/2013 "h/o etoh abuse; stopped drinking ~ 2012"  . Drug Use: 1.00 per week    Special: Marijuana     Comment: 07/02/2013 "stopped marijuana ~ 1 1/2 months ago"  . Sexual Activity: Not Currently   Other Topics Concern  . Not on file   Social History Narrative   Works as a Architect part  time   Smoker 1 pack last 1.5 days tobacco, smokes marijuana    Denies EtOH x 4 years (on 10/12/12)   12 kids    From [place]    [place] Veteran    12 grade education              Review of Systems: CONSTITUTIONAL- No Fever. SKIN- No Rash, colour changes, or itching. HEAD- No Headache, but has initial dizziness on standing up. Mouth/throat- No Sorethroat, dentures, bleeding gums. RESPIRATORY- No Cough, or SOB. CARDIAC- No Palpitations, DOE, PND. GI- No nausea, vomiting, diarrhoea, constipation,or abd pain.  Physical Exam: Blood pressure 97/68, pulse 56, temperature 98.5 F (36.9 C), temperature source Oral, resp. rate 18, height '6\' 3"'  (1.905 m), weight 158 lb 1.6 oz (71.714 kg), SpO2 100.00%. GENERAL- alert, co-operative, appears as stated age, not in any distress. HEENT- Atraumatic, normocephalic, PERRL, EOMI, oral mucosa appears moist,. CARDIAC- Tachycardic, with irreg irreg rthythm, no murmurs, rubs or gallops, chest pain not reproducible on palpation. RESP- Moving equal volumes of air, and clear to auscultation bilaterally. ABDOMEN- Soft, nontender, no palpable masses or organomegaly, bowel sounds present. NEURO- No obvious Cr N abnormality, strenght equal and present in all extremities. EXTREMITIES- pulses reduced- DP, symmetrically, no pedal edema. PSYCH- Normal mood and affect, appropriate thought content and speech.  Lab results: Basic Metabolic Panel:  Recent Labs  07/02/13 1620  NA 145  K 5.3  CL 106  CO2 26  GLUCOSE 105*  BUN 22  CREATININE 2.13*  CALCIUM 9.7  Liver Function Tests: No results found for this basename: AST, ALT, ALKPHOS, BILITOT, PROT, ALBUMIN,  in the last 72 hours No results found for this basename: LIPASE, AMYLASE,  in the last 72 hours No results found for this basename: AMMONIA,  in the last 72 hours CBC:  Recent Labs  07/02/13 2140  WBC 8.7  HGB 13.7  HCT 40.6  MCV 91.6  PLT 151   Cardiac Enzymes:  Recent Labs   07/02/13 1613 07/02/13 2140  TROPONINI <0.30 <0.30   BNP: No results found for this basename: PROBNP,  in the last 72 hours D-Dimer: No results found for this basename: DDIMER,  in the last 72 hours CBG: No results found for this basename: GLUCAP,  in the last 72 hours Hemoglobin A1C: No results found for this basename: HGBA1C,  in the last 72 hours Fasting Lipid Panel: No results found for this basename: CHOL, HDL, LDLCALC, TRIG, CHOLHDL, LDLDIRECT,  in the last 72 hours Thyroid Function Tests: No results found for this basename: TSH, T4TOTAL, FREET4, T3FREE, THYROIDAB,  in the last 72 hours Anemia Panel: No results found for this basename: VITAMINB12, FOLATE, FERRITIN, TIBC, IRON, RETICCTPCT,  in the last 72 hours Coagulation: No results found for this basename: LABPROT, INR,  in the last 72 hours Urine Drug Screen: Drugs of Abuse     Component Value Date/Time   LABOPIA POSITIVE* 10/12/2012 1724   LABOPIA NEGATIVE 12/27/2008 1943   COCAINSCRNUR NONE DETECTED 10/12/2012 1724   COCAINSCRNUR NEG 07/06/2009 2225   LABBENZ NONE DETECTED 10/12/2012 1724   LABBENZ NEG 07/06/2009 2225   AMPHETMU NONE DETECTED 10/12/2012 1724   AMPHETMU NEG 07/06/2009 2225   THCU POSITIVE* 10/12/2012 1724   LABBARB NONE DETECTED 10/12/2012 1724    Alcohol Level: No results found for this basename: ETH,  in the last 72 hours Urinalysis: No results found for this basename: COLORURINE, APPERANCEUR, LABSPEC, PHURINE, GLUCOSEU, HGBUR, BILIRUBINUR, KETONESUR, PROTEINUR, UROBILINOGEN, NITRITE, LEUKOCYTESUR,  in the last 72 hours Misc. Labs:   Imaging results:  No results found.  Other results: EKG: Rate- 142, irregular, no discenable P waves.  Assessment & Plan by Problem: Principal Problem:   Chest pain Active Problems:   TOBACCO ABUSE   COPD (chronic obstructive pulmonary disease)   Chronic kidney disease   Chronic combined systolic and diastolic congestive heart failure   Atrial  fibrillation  Chest pain- Patients chest pain though left sided is atypical of anginal pain-duration of > 32mns, as its not related to exertion, and is aggravated by movement- making pleuritic chest pain more likley.. ACS is of concern considering pts risk factors- Age, male sex, CKD, peripheral artery disease, HTN. Calculated TIMI score- 2, giving him an 8% risk at 14 days of mortality or MI. Low concern for PE as pt is on coumadin and is therapeutic at 2.1. Trops X1 done in the Clinic was normal, EKG not suggestive of ischemia. Last lipid profile- 08/14/2011- LDL- 59, HDL- 45, TG- 43.  - Admit to tele, Obs. - Aspirin 81 mg daily - Cycle trops - Cont beta blocker. - Repeat EKG in the am. - Cont Home PPI  Atrial Fibrilation with RVR- Home meds include- Metoprolol, Diltiazem and warfarin. Claims compliance with medication. Last INR done- 06/28/2013- 2.1.  - Continue home meds- diltiazem 2427mBID, as pt BP and pulse are on the low side will decrease home metop to Metoprolol 5035mID.  COPD- Currently stable on home albuterol and spiriva inhaler. Has significant smoking hx- ~  10 pack year.  - Continue home spiriva.  CKD 3- K- borderline high- 5.3, Bun and Cr appears stable. Avoid nephrotoxic agents, NSAIDs.. - Bmet in the Am.  CHF- systolic and diastolic. Last Echo- transesophageal- 05/27/2013, 45-50% . Currently asymptomatic and without feats of fluid overload.   DVT Ppx- Warfarin per pharmacy for [facility] fib.  Dispo: Disposition is deferred at this time, awaiting improvement of current medical problems. Anticipated discharge in approximately 1-2 day(s).   The patient does have a current PCP Axel Filler, MD) and does need an [facility] hospital follow-up appointment after discharge.  The patient does not know have transportation limitations that hinder transportation to clinic appointments.  Signed: Jenetta Downer, MD 07/02/2013, 11:30 PM

## 2013-07-02 NOTE — Progress Notes (Signed)
Patient ID: Travis SLEETH Sr., male   DOB: Dec 01, 1945, 68 y.o.   MRN: [id number] Subjective:   Patient ID: Travis HARTWIG Sr. male   DOB: Jul 27, 1945 68 y.o.   MRN: [id number]  CC:   Acute visit due to chest pain HPI:  [name] is a 68 y.o. man  with past medical history as outlined below, who present for an acute visit today.  The patient reports that his chest pain started suddenly 2 days ago. Chest is located at the left upper chest, intermittent. It happens every 3-4 hours. Each episode lasts for about 30-60 minutes. It is aggravated by both deep breath and exertion, such as walking. It is alleviated by rest. He does not have fever, chills, cough or SOB. The last episode was this morning. Currently patient has mild chest pain. He has general weakness.   Of note, patient has atrial fibrillation, currently on Coumadin. Last INR was 2.10 on 06/28/13. He does not have bleeding tendency. EKG in clinic showed A. fib with RVR with heart rate of 142/min. His blood pressure is 104/78. Oxygen saturation 99% on room air. Temperature 97.3. Patient does not have tenderness over his calf area. No recent travel or history.  ROS:  Denies fever, chills, headaches,  cough, chest pain, SOB,  abdominal pain,diarrhea, constipation, dysuria, urgency, frequency, hematuria, joint pain or leg swelling.  Past Medical History  Diagnosis Date  . Hypertension   . CKD (chronic kidney disease)     Renal U/S 12/04/2009 showed no pathological findings. Labs 12/04/2009 include normal ESR, C3, C4; neg ANA; SPEP showed nonspecific increase in the alpha-2 region with no M-spike; UPEP showed no monoclonal free light chains; urine IFE showed polyclonal increase in feree Kappa and/or free Lambda light chains. Baseline Cr reported 1.7-2.5.  [name] Chronic systolic heart failure     a. NICM - EF 35% with normal cors 2003. b. Last echo 11/2012 - EF 25-30%.  . Depression   . GERD (gastroesophageal reflux disease)   . Portal vein  thrombosis   . Avascular necrosis     right hip s/p replacement  . Gastritis   . Alcohol abuse   . Erectile dysfunction   . Bipolar disorder   . Hydrocele, unspecified   . Intermittent claudication   . Lung nodule     Chest CT scan on 12/14/2008 showed a nodular opacity at the left lung base felt to most likely represent scarring.  Follow-up chest CT scan on 06/02/2010 showed parenchymal scarring in the left apex, left  lower lobe, and lingula; some of this scarring at the left base had a nodular appearance, unchanged. Chest 09/2012 - stable.  . 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  Thyroid scan 08/28/2009 was normal with no focal areas of abnormal increased or decreased activity seen; the uptake of I 131 sodium iodide at 24 hours was 5.7%.  TSH and free T4 normalized by 08/16/2009.  [name] Pneumonia   . Intestinal obstruction   . COPD (chronic obstructive pulmonary disease)   . Clostridium difficile colitis   . E coli bacteremia   . Cluster headaches   . DVT (deep venous thrombosis)     He has hypercoagulability with multiple prior DVTs  . Pleural plaque     H/o asbestos exposure. Chest CT on 06/02/2010 showed stable extensive calcified pleural plaques involving the left hemithorax, consistent with asbestos related pleural disease.  . Weight loss  Normal colonoscopy by Dr. Olevia Perches on 11/06/2010.  . Tachycardia-bradycardia syndrome     a. 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 b. St. Jude gen change 2010.  [name] PAF (paroxysmal atrial fibrillation)     Paroxysmal, failed medical therapy with amiodarone but has done very well with multaq;  d/c 2/2 to recurrent AFib;  Tikosyn load c/b Hyperkalemia during admx 05/2013 => rate control strategy  . Osteoarthritis   . Spondylosis   . Chronic low back pain     "cause of my hip" (03/17/2012)  . Thrombosis     History of arterial and venous thrombosis including portal vein thrombosis, deep vein thrombosis, and superior  mesenteric artery thrombosis.  . Noncompliance   . Atrial flutter   . Thrombocytopenia   . Hyperkalemia     type 4 RTA   Current Outpatient Prescriptions  Medication Sig Dispense Refill  . albuterol (PROVENTIL HFA;VENTOLIN HFA) 108 (90 BASE) MCG/ACT inhaler Inhale 2 puffs into the lungs every 6 (six) hours as needed for wheezing or shortness of breath.      [name] buPROPion (WELLBUTRIN) 75 MG tablet Take 1 tablet (75 mg total) by mouth 2 (two) times daily.  60 tablet  5  . diltiazem (CARDIZEM CD) 240 MG 24 hr capsule Take 1 capsule (240 mg total) by mouth 2 (two) times daily.  60 capsule  5  . dronabinol (MARINOL) 2.5 MG capsule Take 1 capsule (2.5 mg total) by mouth 2 (two) times daily before lunch and supper.  60 capsule  0  . metoprolol succinate (TOPROL-XL) 100 MG 24 hr tablet Take 1 tablet (100 mg total) by mouth 2 (two) times daily.  180 tablet  3  . omeprazole (PRILOSEC) 20 MG capsule Take 40 mg by mouth daily.      [name] oxyCODONE-acetaminophen (PERCOCET) 10-325 MG per tablet Take 1 tablet by mouth every 6 (six) hours as needed for pain.  120 tablet  0  . sodium bicarbonate 650 MG tablet Take 2 tablets (1,300 mg total) by mouth 2 (two) times daily.  120 tablet  5  . tiotropium (SPIRIVA) 18 MCG inhalation capsule Place 1 capsule (18 mcg total) into inhaler and inhale daily.  30 capsule  11  . warfarin (COUMADIN) 5 MG tablet Take as directed.  60 tablet  0   No current facility-administered medications for this visit.   Family History  Problem Relation Age of Onset  . Hypertension Mother   . Cancer Brother     Unsure of type.  . Asthma Mother   . Coronary artery disease Mother   . Colon cancer Neg Hx   . Lung cancer Neg Hx   . Prostate cancer Neg Hx    History   Social History  . Marital Status: Widowed    Spouse Name: N/A    Number of Children: 10  . Years of Education: N/A   Occupational History  . RETIRED    Social History Main Topics  . Smoking status: Former Smoker --  0.50 packs/day for 50 years    Types: Cigarettes    Quit date: 06/08/2013  . Smokeless tobacco: Never Used  . Alcohol Use: No     Comment: prior etoh abuse  . Drug Use: 1.00 per week    Special: Marijuana     Comment: prior marijuana  . Sexual Activity: Yes   Other Topics Concern  . None   Social History Narrative   Works as a Architect part time   Smoker 1  pack last 1.5 days tobacco, smokes marijuana    Denies EtOH x 4 years (on 10/12/12)   12 kids    From [place]   [place] Veteran    12 grade education              Review of Systems: Full 14-point review of systems otherwise negative. See HPI.   Objective:  Physical Exam: Filed Vitals:   07/02/13 1657  BP: 104/78  Pulse: 75  Temp: 97.3 F (36.3 C)  TempSrc: Oral  Weight: 158 lb 1.6 oz (71.714 kg)  SpO2: 99%   Constitutional: Vital signs reviewed.  Patient is a well-developed and well-nourished, in no acute distress and cooperative with exam.   HEENT:  Head: Normocephalic and atraumatic Mouth: no erythema or exudates, MMM Eyes: PERRL, EOMI, conjunctivae normal, No scleral icterus.  Neck: Supple, Trachea midline normal ROM, No JVD  Cardiovascular: S1 normal, S2 normal, tachycardia, irregularly irregular rhythm. no MRG, pulses symmetric and intact bilaterally Pulmonary/Chest: CTAB, no wheezes, rales, or rhonchi. There is tenderness to pressure over the left upper chest wall. There is pacemaker under the skin over the right upper chest wall.  Abdominal: Soft. Non-tender, non-distended, bowel sounds are normal, no masses, organomegaly, or guarding present.  GU: no CVA tenderness Musculoskeletal: No joint deformities, erythema, or stiffness, ROM full and non-tender Extremities: No leg edema Hematology: no cervical, inginal, or axillary adenopathy.  Neurological: A&O x3, Strength is normal and symmetric bilaterally, cranial nerve II-XII are grossly intact, no focal motor deficit, sensory intact to light touch  bilaterally.  Skin: Warm, dry and intact. No rash, cyanosis, or clubbing.  Psychiatric: Normal mood and affect. No suicidal or homicidal ideation.  Assessment & Plan:

## 2013-07-03 ENCOUNTER — Other Ambulatory Visit: Payer: Self-pay

## 2013-07-03 DIAGNOSIS — K219 Gastro-esophageal reflux disease without esophagitis: Secondary | ICD-10-CM | POA: Diagnosis not present

## 2013-07-03 DIAGNOSIS — R079 Chest pain, unspecified: Secondary | ICD-10-CM

## 2013-07-03 DIAGNOSIS — N183 Chronic kidney disease, stage 3 unspecified: Secondary | ICD-10-CM

## 2013-07-03 DIAGNOSIS — I129 Hypertensive chronic kidney disease with stage 1 through stage 4 chronic kidney disease, or unspecified chronic kidney disease: Secondary | ICD-10-CM | POA: Diagnosis not present

## 2013-07-03 DIAGNOSIS — I5042 Chronic combined systolic (congestive) and diastolic (congestive) heart failure: Secondary | ICD-10-CM

## 2013-07-03 DIAGNOSIS — I4891 Unspecified atrial fibrillation: Secondary | ICD-10-CM

## 2013-07-03 DIAGNOSIS — I509 Heart failure, unspecified: Secondary | ICD-10-CM

## 2013-07-03 DIAGNOSIS — J449 Chronic obstructive pulmonary disease, unspecified: Secondary | ICD-10-CM

## 2013-07-03 DIAGNOSIS — R0789 Other chest pain: Secondary | ICD-10-CM | POA: Diagnosis not present

## 2013-07-03 DIAGNOSIS — N189 Chronic kidney disease, unspecified: Secondary | ICD-10-CM | POA: Diagnosis not present

## 2013-07-03 LAB — PROTIME-INR
INR: 1.22 (ref 0.00–1.49)
Prothrombin Time: 15.1 seconds (ref 11.6–15.2)

## 2013-07-03 LAB — BASIC METABOLIC PANEL
BUN: 22 mg/dL (ref 6–23)
CHLORIDE: 109 meq/L (ref 96–112)
CO2: 23 meq/L (ref 19–32)
CREATININE: 1.87 mg/dL — AB (ref 0.50–1.35)
Calcium: 8.8 mg/dL (ref 8.4–10.5)
GFR calc Af Amer: 41 mL/min — ABNORMAL LOW (ref >90)
GFR calc non Af Amer: 35 mL/min — ABNORMAL LOW (ref >90)
Glucose, Bld: 84 mg/dL (ref 70–99)
Potassium: 4.2 mEq/L (ref 3.7–5.3)
Sodium: 144 mEq/L (ref 137–147)

## 2013-07-03 LAB — TROPONIN I: Troponin I: <0.3 ng/mL (ref <0.30)

## 2013-07-03 MED ORDER — REGADENOSON 0.4 MG/5ML IV SOLN
0.4000 mg | Freq: Once | INTRAVENOUS | Status: AC
  Administered 2013-07-04: 0.4 mg via INTRAVENOUS
  Filled 2013-07-03: qty 5

## 2013-07-03 MED ORDER — METOPROLOL SUCCINATE ER 50 MG PO TB24
50.0000 mg | ORAL_TABLET | Freq: Two times a day (BID) | ORAL | Status: DC
  Administered 2013-07-03 – 2013-07-04 (×2): 50 mg via ORAL
  Filled 2013-07-03 (×3): qty 1

## 2013-07-03 MED ORDER — METOPROLOL SUCCINATE ER 50 MG PO TB24: 50.0000 mg | ORAL_TABLET | Freq: Two times a day (BID) | ORAL | Status: DC

## 2013-07-03 MED ORDER — ASPIRIN 81 MG PO CHEW: 81.0000 mg | CHEWABLE_TABLET | Freq: Every day | ORAL | Status: DC

## 2013-07-03 MED ORDER — METOPROLOL SUCCINATE ER 50 MG PO TB24
50.0000 mg | ORAL_TABLET | Freq: Two times a day (BID) | ORAL | Status: DC
  Administered 2013-07-03: 50 mg via ORAL
  Filled 2013-07-03 (×2): qty 1

## 2013-07-03 MED ORDER — WARFARIN SODIUM 10 MG PO TABS
10.0000 mg | ORAL_TABLET | Freq: Once | ORAL | Status: AC
  Administered 2013-07-03: 10 mg via ORAL
  Filled 2013-07-03: qty 1

## 2013-07-03 MED ORDER — METOPROLOL SUCCINATE ER 100 MG PO TB24
100.0000 mg | ORAL_TABLET | Freq: Two times a day (BID) | ORAL | Status: DC
  Filled 2013-07-03: qty 1

## 2013-07-03 MED ORDER — BOOST / RESOURCE BREEZE PO LIQD
1.0000 | Freq: Two times a day (BID) | ORAL | Status: DC
  Administered 2013-07-04 (×2): 1 via ORAL

## 2013-07-03 NOTE — Progress Notes (Signed)
UR Completed.  Vergie Living [id number] 07/03/2013

## 2013-07-03 NOTE — Progress Notes (Signed)
Patient c/o chest pain 8/10 at 14:10. One sublingual nitroglycerine given and was effective.  Will continue to monitor.

## 2013-07-03 NOTE — Progress Notes (Signed)
Subjective: Pt states that he has had one incident of left sided chest pain since admission that was very brief. He denies any chest pain, SOB, N/V this morning. He was in afib on admission and remains in afib but is rate controlled.   Objective: Vital signs in last 24 hours: Filed Vitals:   07/02/13 2203 07/03/13 0442 07/03/13 0912 07/03/13 0936  BP: 97/68 96/70  98/77  Pulse: 56 73  76  Temp: 98.5 F (36.9 C) 98.2 F (36.8 C)    TempSrc: Oral Oral    Resp: 18 18    Height: 6\' 3"  (1.905 m)     Weight: 158 lb 1.6 oz (71.714 kg) 158 lb 1.6 oz (71.714 kg)    SpO2: 100% 100% 98%    Weight change:   Intake/Output Summary (Last 24 hours) at 07/03/13 1020 Last data filed at 07/03/13 0443  Gross per 24 hour  Intake      3 ml  Output    550 ml  Net   -547 ml   Vitals reviewed. General: Resting in bed, NAD HEENT: PERRL, EOMI, no scleral icterus Cardiac: Irregularly irregular rate and rhythm, no rubs, murmurs or gallops Pulm: clear to auscultation bilaterally, no wheezes, rales, or rhonchi Abd: soft, nontender, nondistended, BS present Ext: warm and well perfused, no pedal edema Neuro: alert and oriented X3, cranial nerves II-XII grossly intact, strength and sensation to light touch equal in bilateral upper and lower extremities  Lab Results: Basic Metabolic Panel:  Recent Labs Lab 07/02/13 1620 07/03/13 0340  NA 145 144  K 5.3 4.2  CL 106 109  CO2 26 23  GLUCOSE 105* 84  BUN 22 22  CREATININE 2.13* 1.87*  CALCIUM 9.7 8.8   CBC:  Recent Labs Lab 07/02/13 2140  WBC 8.7  HGB 13.7  HCT 40.6  MCV 91.6  PLT 151   Cardiac Enzymes:  Recent Labs Lab 07/02/13 1613 07/02/13 2140 07/03/13 0340  TROPONINI <0.30 <0.30 <0.30   Coagulation:  Recent Labs Lab 06/28/13 1635 07/03/13 0340  LABPROT  --  15.1  INR 2.10 1.22   Urine Drug Screen: Drugs of Abuse     Component Value Date/Time   LABOPIA POSITIVE* 10/12/2012 1724   LABOPIA NEGATIVE 12/27/2008 1943     COCAINSCRNUR NONE DETECTED 10/12/2012 1724   COCAINSCRNUR NEG 07/06/2009 2225   LABBENZ NONE DETECTED 10/12/2012 1724   LABBENZ NEG 07/06/2009 2225   AMPHETMU NONE DETECTED 10/12/2012 1724   AMPHETMU NEG 07/06/2009 2225   THCU POSITIVE* 10/12/2012 1724   LABBARB NONE DETECTED 10/12/2012 1724    Misc. Labs:   Micro Results: No results found for this or any previous visit (from the past 240 hour(s)). Studies/Results: No results found.  Medications: I have reviewed the patient's current medications. Scheduled Meds: . aspirin  81 mg Oral Daily  . buPROPion  75 mg Oral BID  . diltiazem  240 mg Oral BID  . metoprolol succinate  50 mg Oral BID  . pantoprazole  40 mg Oral Daily  . senna  1 tablet Oral BID  . sodium bicarbonate  1,300 mg Oral BID  . sodium chloride  3 mL Intravenous Q12H  . tiotropium  18 mcg Inhalation Daily  . warfarin  5 mg Oral ONCE-1800  . Warfarin - Pharmacist Dosing Inpatient   Does not apply q1800   Continuous Infusions:  PRN Meds:.oxyCODONE, oxyCODONE-acetaminophen  Assessment/Plan: 68 yo M with PMH Afib with RVR, COPD, Hyperthyroidism, Depression, HTN, CHF, CKD,  intermittent claudication, avascular necrosis, and portal vein thrombosis presented with left sided chest pain present for 2 days.  Chest pain: Left sided pain that is >30 mins in duration and is aggravated by movement seems more making pleuritic vs MSK. TIMI score is 2, giving him an 8% risk at 14 days of mortality or MI. Trops X3 are negative, EKG w/ Afib. Last lipid profile from 08/14/2011 with LDL- 59, HDL- 45, TG- 43. Will continue to monitor today and will likely d/c home tomorrow as he is remaining in afib with intermittent heart rate in the lower 100s.  - Aspirin 81 mg daily   - Cont beta blocker.  - Cont Home PPI   Atrial Fibrilation with RVR: Pt with a h/o Afib w/ RVR. Home meds include Metoprolol, Diltiazem and warfarin. Endorses compliance with medications. Last INR was 2.1 on 06/28/2013. On  Diltiazem 240mg  BID, Toprol-XL 100mg  BID, and Coumadin at home. The BB was reduced on admission 2/2 mild bradycardia; restarting home dose tonight as he remains in afib with intermittently elevated HR. Will monitor today and likely d/c in the am.    - Continue home meds  COPD: Currently stable on home albuterol and spiriva inhaler. Has significant smoking hx- ~10 pack year.  - Continue home spiriva.   CKD 3: K- borderline high- 5.3 on admission, possibly lab error or hemolysis. Bun and Cr appears stable. K notrmal this am at 4.2  Recent Labs Lab 07/02/13 1620 07/03/13 0340  K 5.3 4.2    CHF: H/o systolic and diastolic failure. Last Echo- transesophageal- 05/27/2013, 45-50% . Currently asymptomatic and without evidence of fluid overload.   DVT PPx: Pt on Warfarin per pharmacy for Afib.      Dispo: Disposition is deferred at this time, awaiting improvement of current medical problems.  Anticipated discharge in approximately 1-2 day(s).   The patient does have a current PCP Axel Filler, MD) and does need an [facility] hospital follow-up appointment after discharge.  The patient does not have transportation limitations that hinder transportation to clinic appointments.  .Services Needed at time of discharge: Y = Yes, Blank = No PT:   OT:   RN:   Equipment:   Other:     LOS: 1 day   Otho Bellows, MD 07/03/2013, 10:20 AM

## 2013-07-03 NOTE — Progress Notes (Signed)
INITIAL NUTRITION ASSESSMENT  DOCUMENTATION CODES Per approved criteria  -Severe malnutrition in the context of chronic illness   INTERVENTION: Resource Breeze po BID, each supplement provides 250 kcal and 9 grams of protein. RD to continue to follow nutrition care plan.  NUTRITION DIAGNOSIS: Increased nutrient needs related to malnutrition and chronic illness as evidenced by estimated nutrition needs.   Goal: Pt to meet >/= 90% of their estimated nutrition needs   Monitor:  PO & supplemental intake, weight, labs, I/O's  Reason for Assessment: Malnutrition Screening Tool Report  68 y.o. male  Admitting Dx: Chest pain  ASSESSMENT: Patient with PMH of atrial fibrillation, HTN, CKD,and chronic systolic heart failure. Pt with several recent readmissions. Current admission related to chest pain x 2 days. Work-up ongoing.  Patient reported that appetite has been poor at home over the past month due to "not feeling well." He reports that he weighed 160-165 pounds in September and had lost down to 138 pounds in December. Pt is now back up to 158 lb. Patient agreeable to consuming Resource Breeze BID.  Patient continues to meet criteria for severe malnutrition in the context of chronic illness as evidenced by severe fat and severe muscle mass loss.  Nutrition Focused Physical Exam:  Subcutaneous Fat:  Orbital Region: mild to moderate depletion Upper Arm Region: severe depletion Thoracic and Lumbar Region: mild to moderate depletion  Muscle:  Temple Region: mild to moderate depeltion Clavicle Bone Region: severe depletion Clavicle and Acromion Bone Region: severe depletion Scapular Bone Region: severe depletion Dorsal Hand: N/A Patellar Region: mild to moderate depletion Anterior Thigh Region: mild to moderate depeltion Posterior Calf Region: mild to moderate depletion  Edema: absent   Height: Ht Readings from Last 1 Encounters:  07/02/13 $RemoveB'6\' 3"'mJPmsQsT$  (1.905 m)    Weight: Wt  Readings from Last 1 Encounters:  07/03/13 158 lb 1.6 oz (71.714 kg)    Ideal Body Weight: 196 pounds  % Ideal Body Weight: 81%  Wt Readings from Last 10 Encounters:  07/03/13 158 lb 1.6 oz (71.714 kg)  07/02/13 158 lb 1.6 oz (71.714 kg)  06/12/13 147 lb 11.3 oz (67 kg)  06/12/13 147 lb 11.3 oz (67 kg)  05/31/13 159 lb (72.122 kg)  05/30/13 157 lb 6.5 oz (71.4 kg)  05/30/13 157 lb 6.5 oz (71.4 kg)  05/18/13 138 lb 14.4 oz (63.005 kg)  05/17/13 144 lb 4.8 oz (65.454 kg)  02/05/13 153 lb 11.2 oz (69.718 kg)    Usual Body Weight: 160  % Usual Body Weight: 99%  BMI:  Body mass index is 19.76 kg/(m^2). Normal  Estimated Nutritional Needs: Kcal: 1800-2000  Protein: 95-105 gm  Fluid: 1.8-2.0 L  Skin: intact  Diet Order: General  EDUCATION NEEDS: -Education needs addressed   Intake/Output Summary (Last 24 hours) at 07/03/13 1700 Last data filed at 07/03/13 0443  Gross per 24 hour  Intake      3 ml  Output    550 ml  Net   -547 ml    Last BM: 2/13  Labs:   Recent Labs Lab 07/02/13 1620 07/03/13 0340  NA 145 144  K 5.3 4.2  CL 106 109  CO2 26 23  BUN 22 22  CREATININE 2.13* 1.87*  CALCIUM 9.7 8.8  GLUCOSE 105* 84    CBG (last 3)  No results found for this basename: GLUCAP,  in the last 72 hours  Scheduled Meds: . aspirin  81 mg Oral Daily  . buPROPion  75 mg  Oral BID  . diltiazem  240 mg Oral BID  . metoprolol succinate  50 mg Oral BID  . pantoprazole  40 mg Oral Daily  . [START ON 07/04/2013] regadenoson  0.4 mg Intravenous Once  . senna  1 tablet Oral BID  . sodium bicarbonate  1,300 mg Oral BID  . sodium chloride  3 mL Intravenous Q12H  . tiotropium  18 mcg Inhalation Daily  . warfarin  10 mg Oral ONCE-1800  . warfarin  5 mg Oral ONCE-1800  . Warfarin - Pharmacist Dosing Inpatient   Does not apply q1800    Continuous Infusions:    Past Medical History  Diagnosis Date  . Hypertension   . CKD (chronic kidney disease)     Renal U/S  12/04/2009 showed no pathological findings. Labs 12/04/2009 include normal ESR, C3, C4; neg ANA; SPEP showed nonspecific increase in the alpha-2 region with no M-spike; UPEP showed no monoclonal free light chains; urine IFE showed polyclonal increase in feree Kappa and/or free Lambda light chains. Baseline Cr reported 1.7-2.5.  [name] Chronic systolic heart failure     a. NICM - EF 35% with normal cors 2003. b. Last echo 11/2012 - EF 25-30%.  . Depression   . GERD (gastroesophageal reflux disease)   . Portal vein thrombosis   . Avascular necrosis     right hip s/p replacement  . Gastritis   . Alcohol abuse   . Erectile dysfunction   . Bipolar disorder   . Hydrocele, unspecified   . Intermittent claudication   . Lung nodule     Chest CT scan on 12/14/2008 showed a nodular opacity at the left lung base felt to most likely represent scarring.  Follow-up chest CT scan on 06/02/2010 showed parenchymal scarring in the left apex, left  lower lobe, and lingula; some of this scarring at the left base had a nodular appearance, unchanged. Chest 09/2012 - stable.  . 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  Thyroid scan 08/28/2009 was normal with no focal areas of abnormal increased or decreased activity seen; the uptake of I 131 sodium iodide at 24 hours was 5.7%.  TSH and free T4 normalized by 08/16/2009.  . Intestinal obstruction   . COPD (chronic obstructive pulmonary disease)   . Clostridium difficile colitis   . E coli bacteremia   . DVT (deep venous thrombosis)     He has hypercoagulability with multiple prior DVTs  . Pleural plaque     H/o asbestos exposure. Chest CT on 06/02/2010 showed stable extensive calcified pleural plaques involving the left hemithorax, consistent with asbestos related pleural disease.  . Weight loss     Normal colonoscopy by Dr. Olevia Perches on 11/06/2010.  . Tachycardia-bradycardia syndrome     a. 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 b. St. Jude gen  change 2010.  [name] PAF (paroxysmal atrial fibrillation)     Paroxysmal, failed medical therapy with amiodarone but has done very well with multaq;  d/c 2/2 to recurrent AFib;  Tikosyn load c/b Hyperkalemia during admx 05/2013 => rate control strategy  . 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  . Noncompliance   . Atrial flutter   . Thrombocytopenia   . Hyperkalemia     type 4 RTA  . Pacemaker   . Shortness of breath     "all the time now" (07/02/2013)  . On  home oxygen therapy     "2L prn" (07/02/2013)  . Pneumonia   . Cluster headaches     "~ twice/wk" (07/02/2013)  . Osteoarthritis   . Spondylosis   . Arthritis     "hips and legs" (07/02/2013)  . Chronic back pain     "all over my back" (07/02/2013)  . Anxiety     Past Surgical History  Procedure Laterality Date  . Hemiarthroplasty hip Left 09/09/2002    bipolar; Dr. [place] Dragon  . Pacemaker insertion  02/2003    Dr. Roe Rutherford, [place] pulse generator identity SR model [id number] serial [id number]; gen change by Greggory Brandy  . Total hip arthroplasty Right 03/08/2008     By Dr. Hiram Comber  . Tee without cardioversion N/A 05/27/2013    Procedure: TRANSESOPHAGEAL ECHOCARDIOGRAM (TEE);  Surgeon: Candee Furbish, MD;  Location: [facility] ENDOSCOPY;  Service: Cardiovascular;  Laterality: N/A;  . Cardioversion N/A 05/27/2013    Procedure: CARDIOVERSION;  Surgeon: Candee Furbish, MD;  Location: [facility] ENDOSCOPY;  Service: Cardiovascular;  Laterality: N/A;  . Cardiac catheterization  2003    Nl cors, EF 35%  . Pacemaker placement  06/17/2008    Lead and generator change w/ St. Jude Medical Tendril ST model [id number] (serial  [id number]).       . Insert / replace / remove pacemaker      Inda Coke MS, RD, LDN Inpatient Registered Dietitian Pager: [phone] After-hours pager: [phone]

## 2013-07-03 NOTE — H&P (Signed)
INTERNAL MEDICINE TEACHING ATTENDING NOTE  Day 1 of stay  Patient name: [name]  MRN: [id number] Date of birth: November 09, 1945   68 y.o. male admitted with with chest pain. Discharged from [place] on 06/12/13 - admission was for hyperkalemia in the setting of CKD & tikosyn use, and Afib with RVR. He also has a systolic heart dysfunction (LVEF in January 45-50%). This admission, he has had another bout of chest pain this morning - in the center of the chest, 8/10, for 15 minutes at 730am. He did not call anyone and it went away on its own. He was lying watching television at that time. Not associated with nausea, vomiting, or sweating. Not associated with exertion or food intake.   He  has a past medical history of Hypertension; CKD (chronic kidney disease); Chronic systolic heart failure; Depression; GERD (gastroesophageal reflux disease); Portal vein thrombosis; Avascular necrosis; Gastritis; Alcohol abuse; Erectile dysfunction; Bipolar disorder; Hydrocele, unspecified; Intermittent claudication; Lung nodule; Hyperthyroidism; Intestinal obstruction; COPD (chronic obstructive pulmonary disease); Clostridium difficile colitis; E coli bacteremia; DVT (deep venous thrombosis); Pleural plaque; Weight loss; Tachycardia-bradycardia syndrome; PAF (paroxysmal atrial fibrillation); Thrombosis; Noncompliance; Atrial flutter; Thrombocytopenia; Hyperkalemia; Pacemaker; Shortness of breath; On home oxygen therapy; Pneumonia; Cluster headaches; Osteoarthritis; Spondylosis; Arthritis; Chronic back pain; and Anxiety.  His home meds:  Cardizem 240 mg SR daily Dronabinol 2.5 BID Toprol XL 100mg  daily Omeprazole 20 daily Percocet 10-325 daily Sodibicarb 650 mg daily Albuterol Spiriva Warfarin 5 - 7.5 mg daily as directed.   His vitals:  Filed Vitals:   07/02/13 2203 07/03/13 0442 07/03/13 0912 07/03/13 0936  BP: 97/68 96/70  98/77  Pulse: 56 73  76  Temp: 98.5 F (36.9 C) 98.2 F (36.8 C)    TempSrc:  Oral Oral    Resp: 18 18    Height: 6\' 3"  (1.905 m)     Weight: 158 lb 1.6 oz (71.714 kg) 158 lb 1.6 oz (71.714 kg)    SpO2: 100% 100% 98%    On exam:  He is tachycardic, appears anxious but no distress.  Chest: Sore to touch Heart : RRR no murmurs at this time.  Lungs: clear No pedal edema, no JVD.  Labs  Recent Labs Lab 06/28/13 1635 07/03/13 0340  INR 2.10 1.22    Recent Labs Lab 07/02/13 1620 07/03/13 0340  NA 145 144  K 5.3 4.2  CL 106 109  CO2 26 23  GLUCOSE 105* 84  BUN 22 22  CREATININE 2.13* 1.87*  CALCIUM 9.7 8.8    Recent Labs Lab 07/02/13 2140  HGB 13.7  HCT 40.6  WBC 8.7  PLT 151     Recent Labs Lab 07/02/13 1613 07/02/13 2140 07/03/13 0340  TROPONINI <0.30 <0.30 <0.30   No results found for this basename: PROBNP,  in the last 168 hours  EKG: Serial EKGs reviewed which do not show any changes corressponding to acute MI.  Current meds . aspirin  81 mg Oral Daily  . buPROPion  75 mg Oral BID  . diltiazem  240 mg Oral BID  . metoprolol succinate  100 mg Oral BID  . pantoprazole  40 mg Oral Daily  . senna  1 tablet Oral BID  . sodium bicarbonate  1,300 mg Oral BID  . sodium chloride  3 mL Intravenous Q12H  . tiotropium  18 mcg Inhalation Daily  . warfarin  10 mg Oral ONCE-1800  . warfarin  5 mg Oral ONCE-1800  . Warfarin - Pharmacist Dosing  Inpatient   Does not apply q1800    Assessment and Plan:  Chest pain - The chest pain could be a component of pleuritis, GERD. AMI has been ruled out at this time. The patient clinically does not look volume overloaded at this time. He has been mostly rate controlled after admission, however, given his extensive previous history, we would opt for a cardiology consult before discharge. Ok to discharge after cardiology consult. Agree with holding lisinopril with SBP <100. We will reassess at the time of discharge about restarting.    I have seen and evaluated this patient and discussed it with my IM  resident team.  Please see the rest of the plan per resident note from today.   [place], [place] 07/03/2013, 11:16 AM.

## 2013-07-03 NOTE — Consult Note (Signed)
CONSULT NOTE  Date: 07/03/2013               Patient Name:  [name] MRN: [id number]  DOB: 1945-07-20 [name] / Sex: 68 y.o., male        PCP: Axel Filler Primary Cardiologist: Allred            Referring Physician: Ellwood Dense              Reason for Consult: Chest pain            History of Present Illness: Patient is a 68 y.o. male with a PMHx of HTN, CKD, chronic systlic CHF, COPD, PAF,  , who was admitted to [facility] on 07/02/2013 for evaluation of chest pain.  [name] is a 68 year old gentleman with a history of COPD, paroxysmal atrial fibrillation, chronic systolic congestive heart failure, depression, pacemaker. His admit he was admitted to the hospital with episodes of chest pain.  Had a stress Myoview study in February, 2014 which is read as a normal study. No evidence of ischemia. The study was not able to be gated.  She is ruled out for myocardial infarction.  EKG reveals atrial flutter with a controlled ventricular response. He has no ST or T wave changes.  He was not very engaged during my interview.  He barely opened his eyes and gave very short answers.  It was difficult to really tease out the specifics of the chest discomfort.  He did say the pain increased with deep breath.  Occurred while he was lying in bed.  He also complains of being hot and diaphoretic.   Medications: Outpatient medications: Prescriptions prior to admission  Medication Sig Dispense Refill  . albuterol (PROVENTIL HFA;VENTOLIN HFA) 108 (90 BASE) MCG/ACT inhaler Inhale 2 puffs into the lungs every 6 (six) hours as needed for wheezing or shortness of breath.      [name] buPROPion (WELLBUTRIN) 75 MG tablet Take 1 tablet (75 mg total) by mouth 2 (two) times daily.  60 tablet  5  . diltiazem (CARDIZEM CD) 240 MG 24 hr capsule Take 1 capsule (240 mg total) by mouth 2 (two) times daily.  60 capsule  5  . dronabinol (MARINOL) 2.5 MG capsule Take 1 capsule (2.5 mg total) by mouth 2 (two) times  daily before lunch and supper.  60 capsule  0  . hydrocortisone ointment 0.5 % Apply 1 application topically 2 (two) times daily as needed for itching.      [name] omeprazole (PRILOSEC) 20 MG capsule Take 40 mg by mouth daily.      [name] oxyCODONE-acetaminophen (PERCOCET) 10-325 MG per tablet Take 1 tablet by mouth every 6 (six) hours as needed for pain.  120 tablet  0  . sodium bicarbonate 650 MG tablet Take 2 tablets (1,300 mg total) by mouth 2 (two) times daily.  120 tablet  5  . tiotropium (SPIRIVA) 18 MCG inhalation capsule Place 1 capsule (18 mcg total) into inhaler and inhale daily.  30 capsule  11  . warfarin (COUMADIN) 5 MG tablet Take 5-7.5 mg by mouth every evening. Takes 58m on 5 days, and 7.569mon two days.      . [DISCONTINUED] metoprolol succinate (TOPROL-XL) 100 MG 24 hr tablet Take 1 tablet (100 mg total) by mouth 2 (two) times daily.  180 tablet  3    Current medications: Current Facility-Administered Medications  Medication Dose Route Frequency Provider Last Rate Last Dose  . aspirin chewable tablet  81 mg  81 mg Oral Daily Olga Millers, MD      . buPROPion [facility]) tablet 75 mg  75 mg Oral BID Olga Millers, MD   75 mg at 07/03/13 1040  . diltiazem (CARDIZEM CD) 24 hr capsule 240 mg  240 mg Oral BID Olga Millers, MD   240 mg at 07/03/13 1038  . metoprolol succinate (TOPROL-XL) 24 hr tablet 50 mg  50 mg Oral BID Otho Bellows, MD      . oxyCODONE-acetaminophen (PERCOCET/ROXICET) 5-325 MG per tablet 1 tablet  1 tablet Oral Q6H PRN Madilyn Fireman, MD   1 tablet at 07/03/13 1038   And  . oxyCODONE (Oxy IR/ROXICODONE) immediate release tablet 5 mg  5 mg Oral Q6H PRN Madilyn Fireman, MD      . pantoprazole (PROTONIX) EC tablet 40 mg  40 mg Oral Daily Olga Millers, MD   40 mg at 07/03/13 1038  . senna (SENOKOT) tablet 8.6 mg  1 tablet Oral BID Olga Millers, MD   8.6 mg at 07/03/13 1040  . sodium bicarbonate tablet 1,300 mg  1,300 mg Oral BID Olga Millers, MD   1,300 mg at 07/03/13 1039  . sodium chloride 0.9 % injection 3 mL  3 mL Intravenous Q12H Olga Millers, MD   3 mL at 07/03/13 1040  . tiotropium (SPIRIVA) inhalation capsule 18 mcg  18 mcg Inhalation Daily Olga Millers, MD   18 mcg at 07/03/13 0911  . warfarin (COUMADIN) tablet 10 mg  10 mg Oral ONCE-1800 Kimberly Ballard Hammons, RPH      . warfarin (COUMADIN) tablet 5 mg  5 mg Oral ONCE-1800 Arman Bogus, [facility]      . Warfarin - Pharmacist Dosing Inpatient   Does not apply q1800 Arman Bogus, [facility]         Allergies  Allergen Reactions  . Digoxin And Related     Dig toxicity 2009  . Penicillins Rash     Past Medical History  Diagnosis Date  . Hypertension   . CKD (chronic kidney disease)     Renal U/S 12/04/2009 showed no pathological findings. Labs 12/04/2009 include normal ESR, C3, C4; neg ANA; SPEP showed nonspecific increase in the alpha-2 region with no M-spike; UPEP showed no monoclonal free light chains; urine IFE showed polyclonal increase in feree Kappa and/or free Lambda light chains. Baseline Cr reported 1.7-2.5.  [name] Chronic systolic heart failure     a. NICM - EF 35% with normal cors 2003. b. Last echo 11/2012 - EF 25-30%.  . Depression   . GERD (gastroesophageal reflux disease)   . Portal vein thrombosis   . Avascular necrosis     right hip s/p replacement  . Gastritis   . Alcohol abuse   . Erectile dysfunction   . Bipolar disorder   . Hydrocele, unspecified   . Intermittent claudication   . Lung nodule     Chest CT scan on 12/14/2008 showed a nodular opacity at the left lung base felt to most likely represent scarring.  Follow-up chest CT scan on 06/02/2010 showed parenchymal scarring in the left apex, left  lower lobe, and lingula; some of this scarring at the left base had a nodular appearance, unchanged. Chest 09/2012 - stable.  . 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  Thyroid scan  08/28/2009 was normal with no focal areas of abnormal increased or decreased activity  seen; the uptake of I 131 sodium iodide at 24 hours was 5.7%.  TSH and free T4 normalized by 08/16/2009.  . Intestinal obstruction   . COPD (chronic obstructive pulmonary disease)   . Clostridium difficile colitis   . E coli bacteremia   . DVT (deep venous thrombosis)     He has hypercoagulability with multiple prior DVTs  . Pleural plaque     H/o asbestos exposure. Chest CT on 06/02/2010 showed stable extensive calcified pleural plaques involving the left hemithorax, consistent with asbestos related pleural disease.  . Weight loss     Normal colonoscopy by Dr. Olevia Perches on 11/06/2010.  . Tachycardia-bradycardia syndrome     a. 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 b. St. Jude gen change 2010.  [name] PAF (paroxysmal atrial fibrillation)     Paroxysmal, failed medical therapy with amiodarone but has done very well with multaq;  d/c 2/2 to recurrent AFib;  Tikosyn load c/b Hyperkalemia during admx 05/2013 => rate control strategy  . 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  . Noncompliance   . Atrial flutter   . Thrombocytopenia   . Hyperkalemia     type 4 RTA  . Pacemaker   . Shortness of breath     "all the time now" (07/02/2013)  . On home oxygen therapy     "2L prn" (07/02/2013)  . Pneumonia   . Cluster headaches     "~ twice/wk" (07/02/2013)  . Osteoarthritis   . Spondylosis   . Arthritis     "hips and legs" (07/02/2013)  . Chronic back pain     "all over my back" (07/02/2013)  . Anxiety     Past Surgical History  Procedure Laterality Date  . Hemiarthroplasty hip Left 09/09/2002    bipolar; Dr. Lafayette Dragon  . Pacemaker insertion  02/2003    Dr. Roe Rutherford, [place] pulse generator identity SR model [id number] serial [id number]; gen change by Greggory Brandy  . Total hip arthroplasty Right 03/08/2008     By  Dr. Hiram Comber  . Tee without cardioversion N/A 05/27/2013    Procedure: TRANSESOPHAGEAL ECHOCARDIOGRAM (TEE);  Surgeon: Candee Furbish, MD;  Location: [facility] ENDOSCOPY;  Service: Cardiovascular;  Laterality: N/A;  . Cardioversion N/A 05/27/2013    Procedure: CARDIOVERSION;  Surgeon: Candee Furbish, MD;  Location: [facility] ENDOSCOPY;  Service: Cardiovascular;  Laterality: N/A;  . Cardiac catheterization  2003    Nl cors, EF 35%  . Pacemaker placement  06/17/2008    Lead and generator change w/ St. Jude Medical Tendril ST model [id number] (serial  [id number]).       . Insert / replace / remove pacemaker      Family History  Problem Relation [name] of Onset  . Hypertension Mother   . Cancer Brother     Unsure of type.  . Asthma Mother   . Coronary artery disease Mother   . Colon cancer Neg Hx   . Lung cancer Neg Hx   . Prostate cancer Neg Hx     Social History:  reports that he quit smoking about 3 weeks ago. His smoking use included Cigarettes. He has a 81 pack-year smoking history. He has never used smokeless tobacco. He reports that he drinks alcohol. He reports that he uses illicit drugs (Marijuana) about once per week.   Review of Systems: Constitutional:  admits to fever,  diaphoresis  HEENT: denies photophobia, eye  pain, redness, hearing loss, ear pain, congestion, sore throat, rhinorrhea, sneezing, neck pain, neck stiffness and tinnitus.  Respiratory: denies SOB, DOE, cough, chest tightness, and wheezing.  Cardiovascular: admits to chest pain, , worse with deep breath  Gastrointestinal: denies nausea, vomiting, abdominal pain, diarrhea, constipation, blood in stool.  Genitourinary: denies dysuria, urgency, frequency, hematuria, flank pain and difficulty urinating.  Musculoskeletal: denies  myalgias, back pain, joint swelling, arthralgias and gait problem.   Skin: denies pallor, rash and wound.  Neurological: denies dizziness, seizures, syncope, weakness, light-headedness, numbness and headaches.     Hematological: denies adenopathy, easy bruising, personal or family bleeding history.  Psychiatric/ Behavioral: denies suicidal ideation, mood changes, confusion, nervousness, sleep disturbance and agitation.    Physical Exam: BP 92/70  Pulse 58  Temp(Src) 98.2 F (36.8 C) (Oral)  Resp 18  Ht '6\' 3"'  (1.905 m)  Wt 158 lb 1.6 oz (71.714 kg)  BMI 19.76 kg/m2  SpO2 100%  Wt Readings from Last 3 Encounters:  07/03/13 158 lb 1.6 oz (71.714 kg)  07/02/13 158 lb 1.6 oz (71.714 kg)  06/12/13 147 lb 11.3 oz (67 kg)    General: Vital signs reviewed and noted. Thin, chronically ill appearing  Head: Normocephalic, atraumatic, sclera anicteric,   Neck: Supple. Negative for carotid bruits. No JVD   Lungs:  Clear bilaterally, no  wheezes, rales, or rhonchi. Breathing is normal   Heart: RRR with S1 S2. No murmurs, rubs, or gallops   Abdomen:  Soft, non-tender, non-distended with normoactive bowel sounds. No hepatomegaly. No rebound/guarding. No obvious abdominal masses   MSK: Strength and the appear normal for [name].   Extremities: No clubbing or cyanosis. No edema.  Distal pedal pulses are 2+ and equal   Neurologic: Alert and oriented X 3. Moves all extremities spontaneously.  Psych: Was not engaged in our conversation.     Lab results: Basic Metabolic Panel:  Recent Labs Lab 07/02/13 1620 07/03/13 0340  NA 145 144  K 5.3 4.2  CL 106 109  CO2 26 23  GLUCOSE 105* 84  BUN 22 22  CREATININE 2.13* 1.87*  CALCIUM 9.7 8.8    Liver Function Tests: No results found for this basename: AST, ALT, ALKPHOS, BILITOT, PROT, ALBUMIN,  in the last 168 hours No results found for this basename: LIPASE, AMYLASE,  in the last 168 hours No results found for this basename: AMMONIA,  in the last 168 hours  CBC:  Recent Labs Lab 07/02/13 2140  WBC 8.7  HGB 13.7  HCT 40.6  MCV 91.6  PLT 151    Cardiac Enzymes:  Recent Labs Lab 07/02/13 1613 07/02/13 2140 07/03/13 0340  TROPONINI <0.30  <0.30 <0.30    BNP: No components found with this basename: POCBNP,   CBG: No results found for this basename: GLUCAP,  in the last 168 hours  Coagulation Studies:  Recent Labs  07/03/13 0340  LABPROT 15.1  INR 1.22     Other results:  EKG : Atrial flutter with a controlled ventricular response. He has no ST or T wave changes.   Imaging:  No results found.    Assessment & Plan: 1. Chest pain: The patient presents with some chest pain that started yesterday. He had another episode this morning. The pain is pleuritic. It is not associated with any EKG changes. His enzymes are negative.  He had a negative stress Myoview study last year.  My suspicion is that this is noncardiac although he does have numerous risk factors for  CAD.  He has multiple medical problems.Burnis Medin schedule him for a [employer].   2. Chronic systolic congestive heart failure: His ejection fraction is around 45%.  3. atrial flutter:  Stable. The patient was not able to start Tikosyn due to hyperkalemia   Thayer Headings, Brooke Bonito., MD, [facility] 07/03/2013, 3:03 PM Office - [phone] Pager [phone]

## 2013-07-03 NOTE — Progress Notes (Addendum)
ANTICOAGULATION CONSULT NOTE - Follow Up Consult  Pharmacy Consult for Coumadin Indication: history of multiple thrombus (DVT, portal vein, mesenteric artery) and PAF  Allergies  Allergen Reactions  . Digoxin And Related     Dig toxicity 2009  . Penicillins Rash    Patient Measurements: Height: 6\' 3"  (190.5 cm) (taken from 06/02/13 record) Weight: 158 lb 1.6 oz (71.714 kg) IBW/kg (Calculated) : 84.5  Vital Signs: Temp: 98.2 F (36.8 C) (02/14 0442) Temp src: Oral (02/14 0442) BP: 98/77 mmHg (02/14 0936) Pulse Rate: 76 (02/14 0936)  Labs:  Recent Labs  07/02/13 1613 07/02/13 1620 07/02/13 2140 07/03/13 0340  HGB  --   --  13.7  --   HCT  --   --  40.6  --   PLT  --   --  151  --   LABPROT  --   --   --  15.1  INR  --   --   --  1.22  CREATININE  --  2.13*  --  1.87*  TROPONINI <0.30  --  <0.30 <0.30    Estimated Creatinine Clearance: 37.8 ml/min (by C-G formula based on Cr of 1.87).   Medications:  Prescriptions prior to admission  Medication Sig Dispense Refill  . albuterol (PROVENTIL HFA;VENTOLIN HFA) 108 (90 BASE) MCG/ACT inhaler Inhale 2 puffs into the lungs every 6 (six) hours as needed for wheezing or shortness of breath.      [name] buPROPion (WELLBUTRIN) 75 MG tablet Take 1 tablet (75 mg total) by mouth 2 (two) times daily.  60 tablet  5  . diltiazem (CARDIZEM CD) 240 MG 24 hr capsule Take 1 capsule (240 mg total) by mouth 2 (two) times daily.  60 capsule  5  . dronabinol (MARINOL) 2.5 MG capsule Take 1 capsule (2.5 mg total) by mouth 2 (two) times daily before lunch and supper.  60 capsule  0  . hydrocortisone ointment 0.5 % Apply 1 application topically 2 (two) times daily as needed for itching.      . metoprolol succinate (TOPROL-XL) 100 MG 24 hr tablet Take 1 tablet (100 mg total) by mouth 2 (two) times daily.  180 tablet  3  . omeprazole (PRILOSEC) 20 MG capsule Take 40 mg by mouth daily.      [name] oxyCODONE-acetaminophen (PERCOCET) 10-325 MG per tablet Take 1  tablet by mouth every 6 (six) hours as needed for pain.  120 tablet  0  . sodium bicarbonate 650 MG tablet Take 2 tablets (1,300 mg total) by mouth 2 (two) times daily.  120 tablet  5  . tiotropium (SPIRIVA) 18 MCG inhalation capsule Place 1 capsule (18 mcg total) into inhaler and inhale daily.  30 capsule  11  . warfarin (COUMADIN) 5 MG tablet Take 5-7.5 mg by mouth every evening. Takes 5mg  on 5 days, and 7.5mg  on two days.        Assessment: 68 yo M on Coumadin PTA for a hx of multiple thrombus and PAF.  Pt presented to clinic with 2d history of CP and EKG in clinic showed A. fib with RVR with heart rate of 142/min.  Per clinic records, Coumadin dose is 7.5mg  daily except 5mg  on Wed/Fri. Per patient, dose is 5mg  daily except 7.5mg  two days per week. Perhaps there is some confusion about the Coumadin regimen and this is why his INR is lower than expected.  Also, patient refused Coumadin dose that was ordered for 2/13 PM.  Noted questionable hx of  noncompliance as well.  Goal of Therapy:  INR 2-3 Monitor platelets by anticoagulation protocol: Yes   Plan:  INR is sub-therapeutic.  Will increase Coumadin dose to 10mg  PO x 1 tonight. Continue daily INR.  MD, does patient need bridging with Heparin or Lovenox in setting of new chest pain?   [employer], Pharm.D., BCPS Clinical Pharmacist Pager [phone] 07/03/2013 10:22 AM

## 2013-07-04 ENCOUNTER — Observation Stay (HOSPITAL_COMMUNITY): Payer: Medicare HMO

## 2013-07-04 ENCOUNTER — Other Ambulatory Visit: Payer: Self-pay

## 2013-07-04 DIAGNOSIS — R079 Chest pain, unspecified: Secondary | ICD-10-CM

## 2013-07-04 LAB — TROPONIN I: Troponin I: <0.3 ng/mL (ref <0.30)

## 2013-07-04 LAB — PROTIME-INR
INR: 1.02 (ref 0.00–1.49)
PROTHROMBIN TIME: 13.2 s (ref 11.6–15.2)

## 2013-07-04 MED ORDER — TECHNETIUM TC 99M SESTAMIBI - CARDIOLITE
10.0000 | Freq: Once | INTRAVENOUS | Status: AC | PRN
  Administered 2013-07-04: 08:00:00 10 via INTRAVENOUS

## 2013-07-04 MED ORDER — WARFARIN SODIUM 10 MG PO TABS
10.0000 mg | ORAL_TABLET | Freq: Once | ORAL | Status: DC
  Filled 2013-07-04: qty 1

## 2013-07-04 MED ORDER — TECHNETIUM TC 99M SESTAMIBI - CARDIOLITE
30.0000 | Freq: Once | INTRAVENOUS | Status: AC | PRN
  Administered 2013-07-04: 10:00:00 30 via INTRAVENOUS

## 2013-07-04 MED ORDER — REGADENOSON 0.4 MG/5ML IV SOLN
INTRAVENOUS | Status: AC
  Administered 2013-07-04: 0.4 mg via INTRAVENOUS
  Filled 2013-07-04: qty 5

## 2013-07-04 NOTE — Discharge Instructions (Signed)
1. Please see follow up appointments below:  Thompson Grayer On 07/07/2013 11:30 AM [address] [place] [address] [phone]   Axel Filler  [facility] clinic will call you with appointment. [place] [address] [phone]    2. Please take all medications as prescribed.   3. If you have worsening of your symptoms or new symptoms arise, please call the clinic ([phone]), or go to the ER immediately if symptoms are severe.  You have done a great job in taking all your medications. I appreciate it very much. Please continue doing that.    Chest Pain (Nonspecific) It is often hard to give a specific diagnosis for the cause of chest pain. There is always a chance that your pain could be related to something serious, such as a heart attack or a blood clot in the lungs. You need to follow up with your caregiver for further evaluation. CAUSES   Heartburn.  Pneumonia or bronchitis.  Anxiety or stress.  Inflammation around your heart (pericarditis) or lung (pleuritis or pleurisy).  A blood clot in the lung.  A collapsed lung (pneumothorax). It can develop suddenly on its own (spontaneous pneumothorax) or from injury (trauma) to the chest.  Shingles infection (herpes zoster virus). The chest wall is composed of bones, muscles, and cartilage. Any of these can be the source of the pain.  The bones can be bruised by injury.  The muscles or cartilage can be strained by coughing or overwork.  The cartilage can be affected by inflammation and become sore (costochondritis). DIAGNOSIS  Lab tests or other studies, such as X-rays, electrocardiography, stress testing, or cardiac imaging, may be needed to find the cause of your pain.  TREATMENT   Treatment depends on what may be causing your chest pain. Treatment may include:  Acid blockers for heartburn.  Anti-inflammatory medicine.  Pain medicine for inflammatory conditions.  Antibiotics if an  infection is present.  You may be advised to change lifestyle habits. This includes stopping smoking and avoiding alcohol, caffeine, and chocolate.  You may be advised to keep your head raised (elevated) when sleeping. This reduces the chance of acid going backward from your stomach into your esophagus.  Most of the time, nonspecific chest pain will improve within 2 to 3 days with rest and mild pain medicine. HOME CARE INSTRUCTIONS   If antibiotics were prescribed, take your antibiotics as directed. Finish them even if you start to feel better.  For the next few days, avoid physical activities that bring on chest pain. Continue physical activities as directed.  Do not smoke.  Avoid drinking alcohol.  Only take over-the-counter or prescription medicine for pain, discomfort, or fever as directed by your caregiver.  Follow your caregiver's suggestions for further testing if your chest pain does not go away.  Keep any follow-up appointments you made. If you do not go to an appointment, you could develop lasting (chronic) problems with pain. If there is any problem keeping an appointment, you must call to reschedule. SEEK MEDICAL CARE IF:   You think you are having problems from the medicine you are taking. Read your medicine instructions carefully.  Your chest pain does not go away, even after treatment.  You develop a rash with blisters on your chest. SEEK IMMEDIATE MEDICAL CARE IF:   You have increased chest pain or pain that spreads to your arm, neck, jaw, back, or abdomen.  You develop shortness of breath, an increasing cough,  or you are coughing up blood.  You have severe back or abdominal pain, feel nauseous, or vomit.  You develop severe weakness, fainting, or chills.  You have a fever. THIS IS AN EMERGENCY. Do not wait to see if the pain will go away. Get medical help at once. Call your local emergency services (911 in [place] Do not drive yourself to the hospital. MAKE  SURE YOU:   Understand these instructions.  Will watch your condition.  Will get help right away if you are not doing well or get worse. Document Released: 02/13/2005 Document Revised: 07/29/2011 Document Reviewed: 12/10/2007 [facility] Patient Information 2014 [place].

## 2013-07-04 NOTE — Progress Notes (Signed)
Subjective: No complaints. Denies CP/SOB.  Objective: Vital signs in last 24 hours: Temp:  [98.3 F (36.8 C)-98.7 F (37.1 C)] 98.3 F (36.8 C) (02/15 0458) Pulse Rate:  [58-109] 109 (02/15 0951) Resp:  [16-18] 16 (02/15 0458) BP: (92-104)/(70-78) 96/74 mmHg (02/15 0951) SpO2:  [100 %] 100 % (02/15 0458) Last BM Date: 07/02/13  Intake/Output from previous day: 02/14 0701 - 02/15 0700 In: 3 [I.V.:3] Out: 500 [Urine:500] Intake/Output this shift:    Medications Current Facility-Administered Medications  Medication Dose Route Frequency Provider Last Rate Last Dose  . aspirin chewable tablet 81 mg  81 mg Oral Daily Olga Millers, MD      . buPROPion [facility]) tablet 75 mg  75 mg Oral BID Olga Millers, MD   75 mg at 07/03/13 2140  . diltiazem (CARDIZEM CD) 24 hr capsule 240 mg  240 mg Oral BID Olga Millers, MD   240 mg at 07/03/13 2140  . feeding supplement (RESOURCE BREEZE) (RESOURCE BREEZE) liquid 1 Container  1 Container Oral BID BM Erlene Quan, RD      . metoprolol succinate (TOPROL-XL) 24 hr tablet 50 mg  50 mg Oral BID Otho Bellows, MD   50 mg at 07/03/13 2140  . oxyCODONE-acetaminophen (PERCOCET/ROXICET) 5-325 MG per tablet 1 tablet  1 tablet Oral Q6H PRN Madilyn Fireman, MD   1 tablet at 07/04/13 0459   And  . oxyCODONE (Oxy IR/ROXICODONE) immediate release tablet 5 mg  5 mg Oral Q6H PRN Madilyn Fireman, MD   5 mg at 07/04/13 0459  . pantoprazole (PROTONIX) EC tablet 40 mg  40 mg Oral Daily Olga Millers, MD   40 mg at 07/03/13 1038  . senna (SENOKOT) tablet 8.6 mg  1 tablet Oral BID Olga Millers, MD   8.6 mg at 07/03/13 2140  . sodium bicarbonate tablet 1,300 mg  1,300 mg Oral BID Olga Millers, MD   1,300 mg at 07/03/13 2140  . sodium chloride 0.9 % injection 3 mL  3 mL Intravenous Q12H Olga Millers, MD   3 mL at 07/03/13 2140  . tiotropium (SPIRIVA) inhalation capsule 18 mcg  18 mcg Inhalation Daily Olga Millers, MD   18 mcg at 07/03/13 0911  . warfarin (COUMADIN) tablet 10 mg  10 mg Oral [address] [place], [place]      . Warfarin - Pharmacist Dosing Inpatient   Does not apply q1800 Arman Bogus, [facility]        PE: He seemed to be in better spirits today.  General appearance: alert, cooperative and no distress Lungs: clear to auscultation bilaterally Heart: regular rate and rhythm Extremities: no LEE Pulses: 2+ and symmetric Skin: warm and dry Neurologic: Grossly normal  Lab Results:   Recent Labs  07/02/13 2140  WBC 8.7  HGB 13.7  HCT 40.6  PLT 151   BMET  Recent Labs  07/02/13 1620 07/03/13 0340  NA 145 144  K 5.3 4.2  CL 106 109  CO2 26 23  GLUCOSE 105* 84  BUN 22 22  CREATININE 2.13* 1.87*  CALCIUM 9.7 8.8   PT/INR  Recent Labs  07/03/13 0340 07/04/13 0400  LABPROT 15.1 13.2  INR 1.22 1.02    Assessment/Plan    Principal Problem:   Chest pain Active Problems:   TOBACCO ABUSE   COPD (chronic obstructive pulmonary disease)   Chronic kidney disease   Chronic combined systolic and diastolic congestive  heart failure   Atrial fibrillation  Plan: No further CP. Lexiscan NST was completed today. Pt tolerated the procedure well. He had mild nausea and a slight HA during test. Symptoms resolved by completion. No significant ischemic changes noted. Atrial flutter rate controlled. Radiologist interpretation to follow.       LOS: 2 days    Brittainy M. Rosita Fire, PA-C 07/04/2013 10:00 AM  Attending Note:   The patient was seen and examined.  Agree with assessment and plan as noted above.  Changes made to the above note as needed.  1. CAD:   He has ruled out for MI.  Myoview results pending.   If this is negative, he can probably go home and follow up with Dr. Rayann Heman.  2. Chronic systolic CHf:  Stable  3. A-flutter   :  Follow up with Dr. Rayann Heman.   Thayer Headings, Brooke Bonito., MD, [facility] 07/04/2013, 11:33 AM

## 2013-07-04 NOTE — Progress Notes (Signed)
ANTICOAGULATION CONSULT NOTE - Follow Up Consult  Pharmacy Consult for Coumadin Indication: history of multiple thrombus (DVT, portal vein, mesenteric artery) and PAF  Allergies  Allergen Reactions  . Digoxin And Related     Dig toxicity 2009  . Penicillins Rash    Patient Measurements: Height: 6\' 3"  (190.5 cm) (taken from 06/02/13 record) Weight: 158 lb 1.6 oz (71.714 kg) IBW/kg (Calculated) : 84.5  Vital Signs: Temp: 98.3 F (36.8 C) (02/15 0458) Temp src: Oral (02/15 0458) BP: 103/72 mmHg (02/15 0458) Pulse Rate: 84 (02/15 0458)  Labs:  Recent Labs  07/02/13 1620 07/02/13 2140 07/03/13 0340 07/03/13 1545 07/03/13 1910 07/04/13 0400  HGB  --  13.7  --   --   --   --   HCT  --  40.6  --   --   --   --   PLT  --  151  --   --   --   --   LABPROT  --   --  15.1  --   --  13.2  INR  --   --  1.22  --   --  1.02  CREATININE 2.13*  --  1.87*  --   --   --   TROPONINI  --  <0.30 <0.30 <0.30 <0.30 <0.30    Estimated Creatinine Clearance: 37.8 ml/min (by C-G formula based on Cr of 1.87).   Medications:  Prescriptions prior to admission  Medication Sig Dispense Refill  . albuterol (PROVENTIL HFA;VENTOLIN HFA) 108 (90 BASE) MCG/ACT inhaler Inhale 2 puffs into the lungs every 6 (six) hours as needed for wheezing or shortness of breath.      [name] buPROPion (WELLBUTRIN) 75 MG tablet Take 1 tablet (75 mg total) by mouth 2 (two) times daily.  60 tablet  5  . diltiazem (CARDIZEM CD) 240 MG 24 hr capsule Take 1 capsule (240 mg total) by mouth 2 (two) times daily.  60 capsule  5  . dronabinol (MARINOL) 2.5 MG capsule Take 1 capsule (2.5 mg total) by mouth 2 (two) times daily before lunch and supper.  60 capsule  0  . hydrocortisone ointment 0.5 % Apply 1 application topically 2 (two) times daily as needed for itching.      [name] omeprazole (PRILOSEC) 20 MG capsule Take 40 mg by mouth daily.      [name] oxyCODONE-acetaminophen (PERCOCET) 10-325 MG per tablet Take 1 tablet by mouth every 6  (six) hours as needed for pain.  120 tablet  0  . sodium bicarbonate 650 MG tablet Take 2 tablets (1,300 mg total) by mouth 2 (two) times daily.  120 tablet  5  . tiotropium (SPIRIVA) 18 MCG inhalation capsule Place 1 capsule (18 mcg total) into inhaler and inhale daily.  30 capsule  11  . warfarin (COUMADIN) 5 MG tablet Take 5-7.5 mg by mouth every evening. Takes 5mg  on 5 days, and 7.5mg  on two days.      . [DISCONTINUED] metoprolol succinate (TOPROL-XL) 100 MG 24 hr tablet Take 1 tablet (100 mg total) by mouth 2 (two) times daily.  180 tablet  3    Assessment: 68 yo M on Coumadin PTA for a hx of multiple thrombus and PAF.  Pt presented to clinic with 2d history of CP and EKG in clinic showed A. fib with RVR with heart rate of 142/min.  Per clinic records, Coumadin dose is 7.5mg  daily except 5mg  on Wed/Fri. Per patient, dose is 5mg  daily except 7.5mg  two  days per week. Perhaps there is some confusion about the Coumadin regimen and this is why his INR is lower than expected.  Also, patient refused Coumadin dose that was ordered for 2/13 PM.  Noted questionable hx of noncompliance as well.  INR continues to fall, now essentially normalized.    Goal of Therapy:  INR 2-3 Monitor platelets by anticoagulation protocol: Yes   Plan:  Repeat Coumadin 10mg  PO x 1 tonight. Continue daily INR.  MD, does patient need bridging with Heparin or Lovenox given significant clot history?   [employer], Pharm.D., BCPS Clinical Pharmacist Pager [phone] 07/04/2013 8:38 AM

## 2013-07-04 NOTE — Progress Notes (Signed)
Subjective:  Patient seen at bedside this AM. Says he is feeling much better today, says his chest pain is very mild, dull, and left sided, still with mild SOB. Chest tender to palpation. Says he did not sleep well overnight.   Objective: Vital signs in last 24 hours: Filed Vitals:   07/03/13 1410 07/03/13 1500 07/03/13 2045 07/04/13 0458  BP: 95/74  98/78 103/72  Pulse:   99 84  Temp:  98.4 F (36.9 C) 98.7 F (37.1 C) 98.3 F (36.8 C)  TempSrc:   Oral Oral  Resp:   18 16  Height:      Weight:      SpO2:   100% 100%   Weight change:   Intake/Output Summary (Last 24 hours) at 07/04/13 0802 Last data filed at 07/03/13 2140  Gross per 24 hour  Intake      3 ml  Output    500 ml  Net   -497 ml   Vitals reviewed. General: Resting in bed, NAD HEENT: PERRL, EOMI, no scleral icterus Cardiac: Irregularly irregular rate and rhythm, no rubs, murmurs or gallops. Tenderness to palpation over left chest. Pulm: clear to auscultation bilaterally, no wheezes, rales, or rhonchi Abd: soft, nontender, nondistended, BS present Ext: warm and well perfused, no pedal edema Neuro: alert and oriented X3, cranial nerves II-XII grossly intact, strength and sensation to light touch equal in bilateral upper and lower extremities  Lab Results: Basic Metabolic Panel:  Recent Labs Lab 07/02/13 1620 07/03/13 0340  NA 145 144  K 5.3 4.2  CL 106 109  CO2 26 23  GLUCOSE 105* 84  BUN 22 22  CREATININE 2.13* 1.87*  CALCIUM 9.7 8.8   CBC:  Recent Labs Lab 07/02/13 2140  WBC 8.7  HGB 13.7  HCT 40.6  MCV 91.6  PLT 151   Cardiac Enzymes:  Recent Labs Lab 07/03/13 1545 07/03/13 1910 07/04/13 0400  TROPONINI <0.30 <0.30 <0.30   Coagulation:  Recent Labs Lab 06/28/13 1635 07/03/13 0340 07/04/13 0400  LABPROT  --  15.1 13.2  INR 2.10 1.22 1.02   Urine Drug Screen: Drugs of Abuse     Component Value Date/Time   LABOPIA POSITIVE* 10/12/2012 1724   LABOPIA NEGATIVE  12/27/2008 1943   COCAINSCRNUR NONE DETECTED 10/12/2012 1724   COCAINSCRNUR NEG 07/06/2009 2225   LABBENZ NONE DETECTED 10/12/2012 1724   LABBENZ NEG 07/06/2009 2225   AMPHETMU NONE DETECTED 10/12/2012 1724   AMPHETMU NEG 07/06/2009 2225   THCU POSITIVE* 10/12/2012 1724   LABBARB NONE DETECTED 10/12/2012 1724    Medications: I have reviewed the patient's current medications. Scheduled Meds: . aspirin  81 mg Oral Daily  . buPROPion  75 mg Oral BID  . diltiazem  240 mg Oral BID  . feeding supplement (RESOURCE BREEZE)  1 Container Oral BID BM  . metoprolol succinate  50 mg Oral BID  . pantoprazole  40 mg Oral Daily  . regadenoson  0.4 mg Intravenous Once  . senna  1 tablet Oral BID  . sodium bicarbonate  1,300 mg Oral BID  . sodium chloride  3 mL Intravenous Q12H  . tiotropium  18 mcg Inhalation Daily  . Warfarin - Pharmacist Dosing Inpatient   Does not apply q1800   Continuous Infusions:  PRN Meds:.oxyCODONE, oxyCODONE-acetaminophen  Assessment/Plan: 68 yo M with PMH Afib with RVR, COPD, Hyperthyroidism, Depression, HTN, CHF, CKD, intermittent claudication, avascular necrosis, and portal vein thrombosis presented with left sided chest pain present for  2 days.  Chest pain: Left-sided chest pain, seems pleuritic vs musculoskeletal in nature. TIMI score is 2, significant for an 8% risk at 14 days of mortality or MI. Trops X3 are negative, EKG w/ A-fib. Last lipid profile from 08/14/2011 wnl. Seen by cardiology, recommended nuclear stress test today, which showed no significant ischemic changes. Stable for discharge.  - ASA 81 - Continue beta blocker.  - Continue Protonix  Atrial Fibrilation with RVR: Pt with a h/o Afib w/ RVR. Home meds include Metoprolol, Diltiazem and Coumadin. Endorses compliance with medications. Last INR was 2.1 on 06/28/2013. On Diltiazem 240mg  BID, Toprol-XL 100mg  BID, and Coumadin at home. Decreased Toprol d/t soft BP + bradycardia on admission - Continue Toprol 50 mg bid  + Cardizem CD 240 mg qd - Coumadin per pharmacy  COPD: Currently stable on home albuterol and spiriva inhaler. Has significant smoking hx- ~10 pack year.  - Continue Spiriva.   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 Creatinine trend as follows:  Recent Labs Lab 07/02/13 1620 07/03/13 0340  CREATININE 2.13* 1.87*  - Continue to monitor BMP - Continue home HCO3 1300 mg bid  CHF: H/o systolic and diastolic failure. Last Echo- TEE- 05/27/2013, 45-50%.  - Currently asymptomatic and without evidence of fluid overload.   Depression: No issues at this time, mood stable - Continue Wellbutrin 75 mg bid.  DVT PPx: Pt on Coumadin per pharmacy for A-fib.   Dispo: Discharge today.  The patient does have a current PCP Axel Filler, MD) and does need an [facility] hospital follow-up appointment after discharge.  The patient does not have transportation limitations that hinder transportation to clinic appointments.  .Services Needed at time of discharge: Y = Yes, Blank = No PT:   OT:   RN:   Equipment:   Other:     LOS: 2 days   Corky Sox, MD 07/04/2013, 8:02 AM

## 2013-07-04 NOTE — Discharge Summary (Signed)
Name: [name] MRN: [id number] DOB: 1946-03-05 68 y.o. PCP: Axel Filler, MD  Date of Admission: 07/02/2013  6:25 PM Date of Discharge: 07/04/2013 Attending Physician: Madilyn Fireman, MD  Discharge Diagnosis: 1. Chest pain 2. A-fib w/ RVR 3. COPD 4. CKD stage III 5. Combined CHF  Discharge Medications:   Medication List         albuterol 108 (90 BASE) MCG/ACT inhaler  Commonly known as:  PROVENTIL HFA;VENTOLIN HFA  Inhale 2 puffs into the lungs every 6 (six) hours as needed for wheezing or shortness of breath.     aspirin 81 MG chewable tablet  Chew 1 tablet (81 mg total) by mouth daily.     buPROPion 75 MG tablet  Commonly known as:  WELLBUTRIN  Take 1 tablet (75 mg total) by mouth 2 (two) times daily.     diltiazem 240 MG 24 hr capsule  Commonly known as:  CARDIZEM CD  Take 1 capsule (240 mg total) by mouth 2 (two) times daily.     dronabinol 2.5 MG capsule  Commonly known as:  MARINOL  Take 1 capsule (2.5 mg total) by mouth 2 (two) times daily before lunch and supper.     hydrocortisone ointment 0.5 %  Apply 1 application topically 2 (two) times daily as needed for itching.     metoprolol succinate 50 MG 24 hr tablet  Commonly known as:  TOPROL-XL  Take 1 tablet (50 mg total) by mouth 2 (two) times daily. Take with or immediately following a meal.     omeprazole 20 MG capsule  Commonly known as:  PRILOSEC  Take 40 mg by mouth daily.     oxyCODONE-acetaminophen 10-325 MG per tablet  Commonly known as:  PERCOCET  Take 1 tablet by mouth every 6 (six) hours as needed for pain.     sodium bicarbonate 650 MG tablet  Take 2 tablets (1,300 mg total) by mouth 2 (two) times daily.     tiotropium 18 MCG inhalation capsule  Commonly known as:  SPIRIVA  Place 1 capsule (18 mcg total) into inhaler and inhale daily.     warfarin 5 MG tablet  Commonly known as:  COUMADIN  Take 5-7.5 mg by mouth every evening. Takes 5mg  on 5 days, and 7.5mg  on two days.        Disposition and follow-up:   [name] was discharged from [facility] in Good condition.  At the hospital follow up visit please address:  1.  Chest pain, SOB, palpitations?   2.  Labs / imaging needed at time of follow-up: BMP  3.  Pending labs/ test needing follow-up: none  Follow-up Appointments:     Follow-up Information   Follow up with Thompson Grayer, MD On 07/07/2013. (11:30 AM)    Specialty:  Cardiology   Contact information:   [place] [place] [address] [phone]       Follow up with Axel Filler, MD. [facility] clinic will call you with appointment.)    Specialty:  Internal Medicine   Contact information:   [place] [address] [phone]       Discharge Instructions: Discharge Orders   Future Appointments Provider Department Dept Phone   07/07/2013 11:30 AM Thompson Grayer, MD [facility] [phone]   Future Orders Complete By Expires   Call MD for:  difficulty breathing, headache or visual disturbances  As directed    Call  MD for:  difficulty breathing, headache or visual disturbances  As directed    Call MD for:  extreme fatigue  As directed    Call MD for:  persistant dizziness or light-headedness  As directed    Call MD for:  persistant dizziness or light-headedness  As directed    Call MD for:  severe uncontrolled pain  As directed    Call MD for:  severe uncontrolled pain  As directed    Call MD for:  temperature >100.4  As directed    Diet - low sodium heart healthy  As directed    Increase activity slowly  As directed       Consultations:  cardiology  Procedures Performed:  US Renal  06/17/2013   CLINICAL DATA:  Urinary retention. Chronic kidney disease. Hypertension.  EXAM: RENAL/URINARY TRACT ULTRASOUND COMPLETE  COMPARISON:  February 03, 2013.  FINDINGS: Right Kidney:  Length: 9.3 cm. Echogenicity within normal limits. Column of Bertin is noted.  No mass or hydronephrosis visualized.  Left Kidney:  Length: 10.8 cm. Echogenicity within normal limits. No mass or hydronephrosis visualized.  Bladder:  Appears normal for degree of bladder distention. The prevoid volume is 192 cc. The postvoid volume is 12.5 cc.  The prostate gland measures 4.1 x 4.3 x 4.5 cm.  IMPRESSION: No acute abnormality.  No significant change since prior exam.   Electronically Signed   By: Abelardo Diesel M.D.   On: 06/17/2013 15:45   Nm Myocar Multi W/spect W/wall Motion / Ef  07/04/2013   CLINICAL DATA:  Chest pain, history of smoking and COPD  EXAM: MYOCARDIAL IMAGING WITH SPECT (REST AND PHARMACOLOGIC-STRESS)  GATED LEFT VENTRICULAR WALL MOTION STUDY  LEFT VENTRICULAR EJECTION FRACTION  TECHNIQUE: Standard myocardial SPECT imaging was performed after resting intravenous injection of 10 mCi Tc-25m sestamibi. Subsequently, intravenous infusion of Lexiscan was performed under the supervision of the Cardiology staff. At peak effect of the drug, 30 mCi Tc-67m sestamibi was injected intravenously and standard myocardial SPECT imaging was performed. Quantitative gated imaging was also performed to evaluate left ventricular wall motion, and estimate left ventricular ejection fraction.  COMPARISON:  CT CHEST W/O CM dated 06/02/2013; DG CHEST 2 VIEW dated 06/02/2013  FINDINGS: Review of the rotational raw images demonstrates mild patient motion artifact, worse in the provided rest images in comparison to the stress. There is no significant GI or chest wall attenuation.  SPECT imaging demonstrates mild attenuation involving the inferior wall and apex of the left ventricle both of which improve on the provided stress imaging. There is no definitive scintigraphic evidence of prior infarction or pharmacologically induced ischemia.  Quantitative gated analysis demonstrates global mild hypokinesis via without geographic discrete wall motion abnormality.  The resting left ventricular ejection fraction  is [id number] with end-diastolic volume of 123XX123 ml and end-systolic volume of 65 ml.  IMPRESSION: 1. No definitive scintigraphic evidence of prior infarction or pharmacologically induced ischemia. 2. Global hypokinesia without discrete geographic wall motion abnormality. Ejection fraction - 44%.   Electronically Signed   By: Sandi Mariscal M.D.   On: 07/04/2013 12:54   Admission HPI:  76 y o Male with signif PMH- Afib with RVR, COPD, Hyperthyr, Depression, HTN, CHF, CKD, intermittent claudication, avascular necrosis and portal vein thrombosis. Presented with Chest pain of 2 days duration, left chest region, started while he was watching TV, Intermittent- each episode lasting about 6mins, described as pressure like pain, non radiating. Pain when present is worse with movement, but relieved by  rest. Mother had heart problems in her 2s, but doesn't know what kind. No assoc SOB, diaphoresis, no fever and no cough. Currently chest pain free. Pt quit smoking 3 weeks ago, smoked 1 pack a day for the past 10-15 years. Doesn't take a daily aspirin. Pt has afib with RVR, is on Coumadin and claims compliance, Pt also has a hx of Blood clots in his legs.  Hospital Course by problem list:   1. Chest pain- Patient's chest pain though left sided was atypical of anginal pain, duration of > 30 mins, as its not related to exertion, and is aggravated by movement. ACS is of concern considering pts risk factors- Age, male sex, CKD, peripheral artery disease, HTN. Calculated TIMI score- 2, giving him an 8% risk at 14 days of mortality or MI. Low concern for PE as pt is on coumadin and was therapeutic at 2.1. Trops negative x3, EKG not suggestive of ischemia. Last lipid profile- 08/14/2011- LDL- 59, HDL- 45, TG- 43. Continued ASA, beta blocker, and PPI. Seen by cardiology, felt that chest pain was atypical, also had a negative stress test last year. However, given his numerous risk factors for CAD, Myoview was performed on 07/04/13, showed no  definitive scintigraphic evidence of prior infarction or pharmacologically induced ischemia. Also exhibited global hypokinesia without discrete geographic wall motion abnormality and an EF of 44%. ACS successfully ruled out, patient to follow up w/ Dr. Rayann Heman.   2. H/o A-fib w/ RVR- Pt with a h/o Afib w/ RVR. On Diltiazem 240mg  BID, Toprol-XL 100mg  BID, and Coumadin at home. Endorses compliance with medications. The BB was reduced on admission 2/2 mild bradycardia. Last INR was 2.1 on 06/28/2013. Increased metoprolol back to home dose on 07/03/13 d/t intermittent tachycardia.  3. COPD- Stable on home albuterol and spiriva inhaler. Has significant smoking hx ~10 pack year. Continued home spiriva.   4. 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 Continued home HCO3 1300 mg bid.  5. 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 Asymptomatic without features of fluid overload.   Discharge Vitals:   BP 96/71  Pulse 91  Temp(Src) 97.8 F (36.6 C) (Oral)  Resp 20  Ht 6\' 3"  (1.905 m)  Wt 158 lb 1.6 oz (71.714 kg)  BMI 19.76 kg/m2  SpO2 99%  Discharge Labs:  Results for orders placed during the hospital encounter of 07/02/13 (from the past 24 hour(s))  TROPONIN I     Status: None   Collection Time    07/03/13  3:45 PM      Result Value Ref Range   Troponin I <0.30  <0.30 ng/mL  TROPONIN I     Status: None   Collection Time    07/03/13  7:10 PM      Result Value Ref Range   Troponin I <0.30  <0.30 ng/mL  TROPONIN I     Status: None   Collection Time    07/04/13  4:00 AM      Result Value Ref Range   Troponin I <0.30  <0.30 ng/mL  PROTIME-INR     Status: None   Collection Time    07/04/13  4:00 AM      Result Value Ref Range   Prothrombin Time 13.2  11.6 - 15.2 seconds   INR 1.02  0.00 - 1.49    Signed: Corky Sox, MD 07/04/2013, 3:22 PM  Time Spent on Discharge: 35 minutes Services Ordered  on Discharge: none Equipment Ordered on Discharge: none

## 2013-07-07 ENCOUNTER — Encounter: Payer: Medicare HMO | Admitting: Internal Medicine

## 2013-07-08 ENCOUNTER — Encounter: Payer: Self-pay | Admitting: Internal Medicine

## 2013-07-09 NOTE — Discharge Summary (Signed)
Agree with plan. Agree with the resident note and documentation. Discussed case with resident team prior to discharge.

## 2013-07-12 ENCOUNTER — Ambulatory Visit (INDEPENDENT_AMBULATORY_CARE_PROVIDER_SITE_OTHER): Payer: Medicare HMO | Admitting: Pharmacist

## 2013-07-12 DIAGNOSIS — Z7901 Long term (current) use of anticoagulants: Secondary | ICD-10-CM

## 2013-07-12 DIAGNOSIS — I81 Portal vein thrombosis: Secondary | ICD-10-CM

## 2013-07-12 LAB — POCT INR: INR: 1.6

## 2013-07-12 NOTE — Progress Notes (Signed)
Anti-Coagulation Progress Note  [name] is a 68 y.o. male who is currently on an anti-coagulation regimen.    RECENT RESULTS: Recent results are below, the most recent result is correlated with a dose of 47.5 mg. per week: Lab Results  Component Value Date   INR 1.60 07/12/2013   INR 1.02 07/04/2013   INR 1.22 07/03/2013    ANTI-COAG DOSE: Anticoagulation Dose Instructions as of 07/12/2013     [name] Tue Wed Thu Fri Sat   New Dose 7.5 mg 7.5 mg 7.5 mg 10 mg 7.5 mg 7.5 mg 7.5 mg       ANTICOAG SUMMARY: Anticoagulation Episode Summary   Current INR goal   Next INR check 07/26/2013  INR from last check 1.60 (07/12/2013)  Weekly max dose   Target end date   INR check location   Preferred lab   Send INR reminders to ANTICOAG IMP   Indications  Long-term (current) use of anticoagulants [V58.61] Atrila fibrillation (Resolved) [427.31] Thrombosis 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]        Comments       Anticoagulation Care Providers   Provider Role Specialty Phone number   Axel Filler, MD  Internal Medicine [phone]      ANTICOAG TODAY: Anticoagulation Summary as of 07/12/2013   INR goal   Selected INR 1.60 (07/12/2013)  Next INR check 07/26/2013  Target end date    Indications  Long-term (current) use of anticoagulants [V58.61] Atrila fibrillation (Resolved) [427.31] Thrombosis 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]      Anticoagulation Episode Summary   INR check location    Preferred lab    Send INR reminders to ANTICOAG IMP   Comments     Anticoagulation Care Providers   Provider Role Specialty Phone number   Axel Filler, MD  Internal Medicine [phone]      PATIENT INSTRUCTIONS: Patient Instructions  Patient instructed to take medications as defined in the Anti-coagulation Track section of this encounter.  Patient instructed to take today's dose.  Patient verbalized understanding of these instructions.       FOLLOW-UP Return in 2 weeks (on 07/26/2013) for Follow  up INR at 4:15PM.  Jorene Guest, III Pharm.D., CACP

## 2013-07-12 NOTE — Patient Instructions (Signed)
Patient instructed to take medications as defined in the Anti-coagulation Track section of this encounter.  Patient instructed to take today's dose.  Patient verbalized understanding of these instructions.    

## 2013-07-13 ENCOUNTER — Other Ambulatory Visit: Payer: Self-pay | Admitting: Internal Medicine

## 2013-07-14 ENCOUNTER — Other Ambulatory Visit: Payer: Self-pay | Admitting: *Deleted

## 2013-07-14 MED ORDER — DRONABINOL 2.5 MG PO CAPS: 2.5000 mg | ORAL_CAPSULE | Freq: Two times a day (BID) | ORAL | Status: DC

## 2013-07-14 MED ORDER — OXYCODONE-ACETAMINOPHEN 10-325 MG PO TABS: 1.0000 | ORAL_TABLET | Freq: Four times a day (QID) | ORAL | Status: DC | PRN

## 2013-07-14 NOTE — Telephone Encounter (Signed)
I am not clinic attending today so would you pls send the oxycodone refill to the attending to review since Dr Marinda Elk is not here? I wanted to address the Marinol refill though since I was the one who started it. He was 138 when it was started and is now 158. His BMI increased from 17 to 19. I had told him I would cont it IF he gained weight which he did. Therefore, I will refill one more month. He needs appt, preferably with Dr Marinda Elk, to assess and determine if we will refill it end of March. Also he needs to know that we expect him NOT to use THC in addition to the Marinol.

## 2013-07-15 ENCOUNTER — Other Ambulatory Visit: Payer: Self-pay | Admitting: Internal Medicine

## 2013-07-16 ENCOUNTER — Encounter: Payer: Self-pay | Admitting: Internal Medicine

## 2013-07-16 ENCOUNTER — Ambulatory Visit (INDEPENDENT_AMBULATORY_CARE_PROVIDER_SITE_OTHER): Payer: Medicare HMO | Admitting: Internal Medicine

## 2013-07-16 VITALS — BP 126/80 | HR 80 | Temp 98.2°F | Wt 166.2 lb

## 2013-07-16 DIAGNOSIS — R079 Chest pain, unspecified: Secondary | ICD-10-CM

## 2013-07-16 DIAGNOSIS — I509 Heart failure, unspecified: Secondary | ICD-10-CM

## 2013-07-16 DIAGNOSIS — J449 Chronic obstructive pulmonary disease, unspecified: Secondary | ICD-10-CM

## 2013-07-16 DIAGNOSIS — I5042 Chronic combined systolic (congestive) and diastolic (congestive) heart failure: Secondary | ICD-10-CM

## 2013-07-16 DIAGNOSIS — I10 Essential (primary) hypertension: Secondary | ICD-10-CM

## 2013-07-16 DIAGNOSIS — I4891 Unspecified atrial fibrillation: Secondary | ICD-10-CM

## 2013-07-16 DIAGNOSIS — I129 Hypertensive chronic kidney disease with stage 1 through stage 4 chronic kidney disease, or unspecified chronic kidney disease: Secondary | ICD-10-CM

## 2013-07-16 DIAGNOSIS — N189 Chronic kidney disease, unspecified: Secondary | ICD-10-CM

## 2013-07-16 LAB — BASIC METABOLIC PANEL WITH GFR
BUN: 17 mg/dL (ref 6–23)
CHLORIDE: 108 meq/L (ref 96–112)
CO2: 25 meq/L (ref 19–32)
CREATININE: 1.72 mg/dL — AB (ref 0.50–1.35)
Calcium: 9.3 mg/dL (ref 8.4–10.5)
GFR, EST NON AFRICAN AMERICAN: 40 mL/min — AB
GFR, Est African American: 46 mL/min — ABNORMAL LOW
GLUCOSE: 89 mg/dL (ref 70–99)
Potassium: 4.5 mEq/L (ref 3.5–5.3)
Sodium: 143 mEq/L (ref 135–145)

## 2013-07-16 NOTE — Patient Instructions (Signed)
1. You have done great job in taking all your medications. I appreciate it very much. Please continue doing that. 2. Please take all medications as prescribed.  3. If you have worsening of your symptoms or new symptoms arise, please call the clinic ([phone]), or go to the ER immediately if symptoms are severe.  Please bring in all your medication bottles with you in next visit.    

## 2013-07-16 NOTE — Assessment & Plan Note (Signed)
Stable. Currently is using albuterol and spiriva inhalers at home. On 2 L of oxygen at home. He does not have chest pain or cough. He has mild shortness of breath which is at his baseline. His lung auscultation is clear bilaterally.  -Will continue current regimen.

## 2013-07-16 NOTE — Assessment & Plan Note (Signed)
TEE on 05/27/2013, showed EF pf 45-50%. Currently he is taking Lasix 40 mg twice a week. His BW is up by 6 LBs (158 on 07/02/13-->166 LBs), which is most likely due to true weight gain rather than fluid retention, since he is taking Marinol, and no leg edema or JVD. His lung auscultation is clear bilaterally.  -will continue current regimen: lasix 40 mg twice a week.

## 2013-07-16 NOTE — Assessment & Plan Note (Signed)
Patient's chest pain has resolved. Today he does not have any chest pain. He has mild shortness of breath, which is at his baseline, most likely due to COPD. Myoview was performed on 07/04/13, showed no definitive scintigraphic evidence of prior infarction or pharmacologically induced ischemia. He had negative troponin x3 in recent admission.  -Patient is to follow up with cardiologist, Dr. Rayann Heman -Continue Cardizem 240 mg twice a day and the metoprolol 50 mg bid which are also for A fib.

## 2013-07-16 NOTE — Assessment & Plan Note (Signed)
Blood pressure is normal at 126/80 today. Will continue current regimen.

## 2013-07-16 NOTE — Assessment & Plan Note (Signed)
BL cre is 1.6 to 2.0. Cre was 1.87 and K was 4.2 on 07/03/13. He is on home HCO3 1300 mg bid.  -will continue current regimen -Will check BMP today

## 2013-07-16 NOTE — Assessment & Plan Note (Signed)
On Diltiazem 240mg  BID, Toprol-XL 50 mg BID, and Coumadin at home. INR 1.60 on 07/12/13. Dr. Elie Confer adjusted his Coumadin dosage on 07/12/13, and will see patient back in 2 weeks. HR is well controled at 80 today.  -will continue current regimen.

## 2013-07-16 NOTE — Progress Notes (Signed)
Patient ID: [name], male   DOB: 22-May-1945, 68 y.o.   MRN: [id number] Subjective:   Patient ID: [name] male   DOB: 07-07-1945 68 y.o.   MRN: [id number]  CC:    Hospital followup visit.            Acute visit due to HPI:  [name] is a 68 y.o. man with past medical history as outlined below, who presents for a hospital followup visit today.   1. Chest pain: The patient was recently hospitalized from 2/13 to 07/04/13 because of atypical chest pain. ACS was of concern considering pts risk factors- Age, male sex, CKD, peripheral artery disease, HTN. Seen by cardiology, felt that chest pain was atypical, also had a negative stress test last year. Myoview was performed on 07/04/13, showed no definitive scintigraphic evidence of prior infarction or pharmacologically induced ischemia. Also exhibited global hypokinesia without discrete geographic wall motion abnormality and an EF of 44%. Trops negative x3, EKG not suggestive of ischemia. Last lipid profile- 08/14/2011- LDL- 59. ACS successfully ruled out, patient to follow up w/ Dr. Rayann [name]. Continued beta blocker and PPI at discharge. Today, no any chest pain. Patient was supposed to take aspirin, but he is not taking it because he is on Coumadin and dose not want to mess up his coumadin dosing.   2. H/o A-fib w/ RVR- Pt with a h/o Afib w/ RVR. On Diltiazem 260m BID, Toprol-XL 50 mg BID, and Coumadin at home. Endorses compliance with medications. INR 1.60 on 07/12/13. Dr. GElie [name] his Coumadin dosage on 07/12/13, and will see patient back in 2 weeks. HR is 80 today.   3. COPD- Stable on home albuterol and spiriva inhaler. He's using 30 oxygen at home. He has mild shortness of breath, which is at his baseline. He does not have cough, shortness of breath, fever or chills.  4. 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 Continued home HCO3 1300 mg bid at discharge. BL  cre is 1.6 to 2.0. Cre was 1.87 and K was 4.2 on 07/03/13  5. 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 Asymptomatic without features of fluid overload on recent admission. Today, he feels fine.  His BW is up by 6 LBs (158 on 07/02/13-->166 LBs). He is taking Miranol since the beginning of 1/2-15 due to weight loss. No he does not have chest pain. His SOB is at his baseline. Currently she is taking Lasix 40 mg twice a week per patient.  ROS: Denies fever, chills, headaches, cough, chest pain, SOB, abdominal pain,diarrhea, constipation, dysuria, urgency, frequency, hematuria, joint pain or leg swelling.   Past Medical History  Diagnosis Date  . Hypertension   . CKD (chronic kidney disease)     Renal U/S 12/04/2009 showed no pathological findings. Labs 12/04/2009 include normal ESR, C3, C4; neg ANA; SPEP showed nonspecific increase in the alpha-2 region with no M-spike; UPEP showed no monoclonal free light chains; urine IFE showed polyclonal increase in feree Kappa and/or free Lambda light chains. Baseline Cr reported 1.7-2.5.  .Marland KitchenChronic systolic heart failure     a. NICM - EF 35% with normal cors 2003. b. Last echo 11/2012 - EF 25-30%.  . Depression   . GERD (gastroesophageal reflux disease)   . Portal vein thrombosis   . Avascular necrosis     right hip s/p replacement  . Gastritis   .  Alcohol abuse   . Erectile dysfunction   . Bipolar disorder   . Hydrocele, unspecified   . Intermittent claudication   . Lung nodule     Chest CT scan on 12/14/2008 showed a nodular opacity at the left lung base felt to most likely represent scarring.  Follow-up chest CT scan on 06/02/2010 showed parenchymal scarring in the left apex, left  lower lobe, and lingula; some of this scarring at the left base had a nodular appearance, unchanged. Chest 09/2012 - stable.  . 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  Thyroid scan 08/28/2009 was normal with no  focal areas of abnormal increased or decreased activity seen; the uptake of I 131 sodium iodide at 24 hours was 5.7%.  TSH and free T4 normalized by 08/16/2009.  . Intestinal obstruction   . COPD (chronic obstructive pulmonary disease)   . Clostridium difficile colitis   . E coli bacteremia   . DVT (deep venous thrombosis)     He has hypercoagulability with multiple prior DVTs  . Pleural plaque     H/o asbestos exposure. Chest CT on 06/02/2010 showed stable extensive calcified pleural plaques involving the left hemithorax, consistent with asbestos related pleural disease.  . Weight loss     Normal colonoscopy by Dr. Olevia Perches on 11/06/2010.  . Tachycardia-bradycardia syndrome     a. 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 b. St. Jude gen change 2010.  [name] PAF (paroxysmal atrial fibrillation)     Paroxysmal, failed medical therapy with amiodarone but has done very well with multaq;  d/c 2/2 to recurrent AFib;  Tikosyn load c/b Hyperkalemia during admx 05/2013 => rate control strategy  . 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  . Noncompliance   . Atrial flutter   . Thrombocytopenia   . Hyperkalemia     type 4 RTA  . Pacemaker   . Shortness of breath     "all the time now" (07/02/2013)  . On home oxygen therapy     "2L prn" (07/02/2013)  . Pneumonia   . Cluster headaches     "~ twice/wk" (07/02/2013)  . Osteoarthritis   . Spondylosis   . Arthritis     "hips and legs" (07/02/2013)  . Chronic back pain     "all over my back" (07/02/2013)  . Anxiety    Current Outpatient Prescriptions  Medication Sig Dispense Refill  . albuterol (PROVENTIL HFA;VENTOLIN HFA) 108 (90 BASE) MCG/ACT inhaler Inhale 2 puffs into the lungs every 6 (six) hours as needed for wheezing or shortness of breath.      [name] aspirin 81 MG chewable tablet Chew 1 tablet (81 mg total) by mouth daily.  30 tablet    . buPROPion  (WELLBUTRIN) 75 MG tablet Take 1 tablet (75 mg total) by mouth 2 (two) times daily.  60 tablet  5  . diltiazem (CARDIZEM CD) 240 MG 24 hr capsule Take 1 capsule (240 mg total) by mouth 2 (two) times daily.  60 capsule  5  . dronabinol (MARINOL) 2.5 MG capsule Take 1 capsule (2.5 mg total) by mouth 2 (two) times daily before lunch and supper.  60 capsule  0  . hydrocortisone ointment 0.5 % Apply 1 application topically 2 (two) times daily as needed for itching.      . metoprolol succinate (TOPROL-XL) 50 MG 24 hr tablet Take 1 tablet (50  mg total) by mouth 2 (two) times daily. Take with or immediately following a meal.  60 tablet  1  . omeprazole (PRILOSEC) 20 MG capsule Take 40 mg by mouth daily.      [name] omeprazole (PRILOSEC) 20 MG capsule TAKE 2 CAPSULES (40 MG TOTAL) BY MOUTH DAILY.  60 capsule  0  . oxyCODONE-acetaminophen (PERCOCET) 10-325 MG per tablet Take 1 tablet by mouth every 6 (six) hours as needed for pain.  120 tablet  0  . sodium bicarbonate 650 MG tablet Take 2 tablets (1,300 mg total) by mouth 2 (two) times daily.  120 tablet  5  . tiotropium (SPIRIVA) 18 MCG inhalation capsule Place 1 capsule (18 mcg total) into inhaler and inhale daily.  30 capsule  11  . warfarin (COUMADIN) 5 MG tablet TAKE AS DIRECTED.  60 tablet  0   No current facility-administered medications for this visit.   Family History  Problem Relation Age of Onset  . Hypertension Mother   . Cancer Brother     Unsure of type.  . Asthma Mother   . Coronary artery disease Mother   . Colon cancer Neg Hx   . Lung cancer Neg Hx   . Prostate cancer Neg Hx    History   Social History  . Marital Status: Widowed    Spouse Name: N/A    Number of Children: 10  . Years of Education: N/A   Occupational History  . RETIRED    Social History Main Topics  . Smoking status: Former Smoker -- 1.50 packs/day for 54 years    Types: Cigarettes    Quit date: 06/08/2013  . Smokeless tobacco: Never Used  . Alcohol Use: Yes      Comment: 07/02/2013 "h/o etoh abuse; stopped drinking ~ 2012"  . Drug Use: 1.00 per week    Special: Marijuana     Comment: 07/02/2013 "stopped marijuana ~ 1 1/2 months ago"  . Sexual Activity: Not Currently   Other Topics Concern  . None   Social History Narrative   Works as a Architect part time   Smoker 1 pack last 1.5 days tobacco, smokes marijuana    Denies EtOH x 4 years (on 10/12/12)   12 kids    From [place]   [place] Veteran    12 grade education              Review of Systems: Full 14-point review of systems otherwise negative. See HPI.   Objective:  Physical Exam: Filed Vitals:   07/16/13 1524  BP: 126/80  Pulse: 80  Temp: 98.2 F (36.8 C)  TempSrc: Oral  Weight: 166 lb 3.2 oz (75.388 kg)  SpO2: 98%    Constitutional: Vital signs reviewed.  Patient is a well-developed and well-nourished, in no acute distress and cooperative with exam.   HEENT:  Head: Normocephalic and atraumatic Mouth: no erythema or exudates, MMM Eyes: PERRL, EOMI, conjunctivae normal, No scleral icterus.  Neck: Supple, Trachea midline normal ROM, No JVD  Cardiovascular: S1 normal, S2 normal, irregularly irregular rhythm. no MRG, pulses symmetric and intact bilaterally Pulmonary/Chest: CTAB, no wheezes, rales, or rhonchi.  There is pacemaker under the skin over the right upper chest wall.  Abdominal: Soft. Non-tender, non-distended, bowel sounds are normal, no masses, organomegaly, or guarding present.   GU: no CVA tenderness Musculoskeletal: No joint deformities, erythema, or stiffness, ROM full and non-tender Extremities: No leg edema Hematology: no cervical, inginal, or axillary adenopathy.  Neurological: A&O  x3, Strength is normal and symmetric bilaterally, cranial nerve II-XII are grossly intact, no focal motor deficit, sensory intact to light touch bilaterally.  Skin: Warm, dry and intact. No rash, cyanosis, or clubbing.  Psychiatric: Normal mood and affect. No suicidal  or homicidal ideation.    Assessment & Plan:

## 2013-07-19 NOTE — Progress Notes (Signed)
Case discussed with Dr. Blaine Hamper at the time of the visit.  We reviewed the resident's history and exam and pertinent patient test results.  I agree with the assessment, diagnosis, and plan of care documented in the resident's note.

## 2013-07-20 NOTE — Progress Notes (Signed)
Case discussed with Dr. Niu soon after the resident saw the patient.  We reviewed the resident's history and exam and pertinent patient test results.  I agree with the assessment, diagnosis, and plan of care documented in the resident's note. 

## 2013-07-26 ENCOUNTER — Ambulatory Visit: Payer: Medicare HMO

## 2013-08-02 ENCOUNTER — Encounter: Payer: Self-pay | Admitting: Internal Medicine

## 2013-08-02 ENCOUNTER — Ambulatory Visit (INDEPENDENT_AMBULATORY_CARE_PROVIDER_SITE_OTHER): Payer: Medicare HMO | Admitting: Internal Medicine

## 2013-08-02 VITALS — BP 131/88 | HR 67 | Ht 75.0 in | Wt 168.0 lb

## 2013-08-02 DIAGNOSIS — I10 Essential (primary) hypertension: Secondary | ICD-10-CM

## 2013-08-02 DIAGNOSIS — I4891 Unspecified atrial fibrillation: Secondary | ICD-10-CM

## 2013-08-02 DIAGNOSIS — I428 Other cardiomyopathies: Secondary | ICD-10-CM

## 2013-08-02 DIAGNOSIS — I495 Sick sinus syndrome: Secondary | ICD-10-CM

## 2013-08-02 LAB — MDC_IDC_ENUM_SESS_TYPE_INCLINIC
Battery Impedance: 1000 Ohm — CL
Date Time Interrogation Session: 20150316123959
Lead Channel Impedance Value: 424 Ohm
Lead Channel Pacing Threshold Amplitude: 1 V
Lead Channel Pacing Threshold Pulse Width: 0.5 ms
Lead Channel Sensing Intrinsic Amplitude: 5.6 mV
Lead Channel Setting Pacing Amplitude: 2.5 V
MDC IDC MSMT BATTERY VOLTAGE: 2.79 V
MDC IDC PG SERIAL: 2045834
MDC IDC SET LEADCHNL RV PACING PULSEWIDTH: 0.5 ms
MDC IDC SET LEADCHNL RV SENSING SENSITIVITY: 2.5 mV
MDC IDC STAT BRADY RV PERCENT PACED: 19 %

## 2013-08-02 NOTE — Progress Notes (Signed)
PCP:  Axel Filler, MD  The patient presents today for cardiology followup.  Presently, the patient reports doing very well.  He has progressed to persistent if not permanent afib but is asymptomatic.  He has quite smoking. Today, he denies symptoms of palpitations, chest pain, shortness of breath, orthopnea, PND, lower extremity edema, dizziness, presyncope, syncope, or neurologic sequela.  The patient feels that he is tolerating medications without difficulties and is otherwise without complaint today.   Past Medical History  Diagnosis Date  . Hypertension   . CKD (chronic kidney disease)     Renal U/S 12/04/2009 showed no pathological findings. Labs 12/04/2009 include normal ESR, C3, C4; neg ANA; SPEP showed nonspecific increase in the alpha-2 region with no M-spike; UPEP showed no monoclonal free light chains; urine IFE showed polyclonal increase in feree Kappa and/or free Lambda light chains. Baseline Cr reported 1.7-2.5.  [name] Chronic systolic heart failure     a. NICM - EF 35% with normal cors 2003. b. Last echo 11/2012 - EF 25-30%.  . Depression   . GERD (gastroesophageal reflux disease)   . Portal vein thrombosis   . Avascular necrosis     right hip s/p replacement  . Gastritis   . Alcohol abuse   . Erectile dysfunction   . Bipolar disorder   . Hydrocele, unspecified   . Intermittent claudication   . Lung nodule     Chest CT scan on 12/14/2008 showed a nodular opacity at the left lung base felt to most likely represent scarring.  Follow-up chest CT scan on 06/02/2010 showed parenchymal scarring in the left apex, left  lower lobe, and lingula; some of this scarring at the left base had a nodular appearance, unchanged. Chest 09/2012 - stable.  . 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  Thyroid scan 08/28/2009 was normal with no focal areas of abnormal increased or decreased activity seen; the uptake of I 131 sodium iodide at 24 hours was  5.7%.  TSH and free T4 normalized by 08/16/2009.  . Intestinal obstruction   . COPD (chronic obstructive pulmonary disease)   . Clostridium difficile colitis   . E coli bacteremia   . DVT (deep venous thrombosis)     He has hypercoagulability with multiple prior DVTs  . Pleural plaque     H/o asbestos exposure. Chest CT on 06/02/2010 showed stable extensive calcified pleural plaques involving the left hemithorax, consistent with asbestos related pleural disease.  . Weight loss     Normal colonoscopy by Dr. Olevia Perches on 11/06/2010.  . Tachycardia-bradycardia syndrome     a. 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 b. St. Jude gen change 2010.  [name] PAF (paroxysmal atrial fibrillation)     Paroxysmal, failed medical therapy with amiodarone but has done very well with multaq;  d/c 2/2 to recurrent AFib;  Tikosyn load c/b Hyperkalemia during admx 05/2013 => rate control strategy  . 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  . Noncompliance   . Atrial flutter   . Thrombocytopenia   . Hyperkalemia     type 4 RTA  . Pacemaker   . Shortness of breath     "all the time now" (07/02/2013)  . On home oxygen therapy     "2L prn" (07/02/2013)  . Pneumonia   . Cluster headaches     "~ twice/wk" (07/02/2013)  . Osteoarthritis   .  Spondylosis   . Arthritis     "hips and legs" (07/02/2013)  . Chronic back pain     "all over my back" (07/02/2013)  . Anxiety    Past Surgical History  Procedure Laterality Date  . Hemiarthroplasty hip Left 09/09/2002    bipolar; Dr. [place] Dragon  . Pacemaker insertion  02/2003    Dr. Roe Rutherford, [place] pulse generator identity SR model [id number] serial [id number]; gen change by Greggory Brandy  . Total hip arthroplasty Right 03/08/2008     By Dr. Hiram Comber  . Tee without cardioversion N/A 05/27/2013    Procedure: TRANSESOPHAGEAL ECHOCARDIOGRAM (TEE);  Surgeon: Candee Furbish, MD;   Location: [facility] ENDOSCOPY;  Service: Cardiovascular;  Laterality: N/A;  . Cardioversion N/A 05/27/2013    Procedure: CARDIOVERSION;  Surgeon: Candee Furbish, MD;  Location: [facility] ENDOSCOPY;  Service: Cardiovascular;  Laterality: N/A;  . Cardiac catheterization  2003    Nl cors, EF 35%  . Pacemaker placement  06/17/2008    Lead and generator change w/ St. Jude Medical Tendril ST model [id number] (serial  [id number]).       . Insert / replace / remove pacemaker      Current Outpatient Prescriptions  Medication Sig Dispense Refill  . albuterol (PROVENTIL HFA;VENTOLIN HFA) 108 (90 BASE) MCG/ACT inhaler Inhale 2 puffs into the lungs every 6 (six) hours as needed for wheezing or shortness of breath.      [name] buPROPion (WELLBUTRIN) 75 MG tablet Take 1 tablet (75 mg total) by mouth 2 (two) times daily.  60 tablet  5  . diltiazem (CARDIZEM CD) 240 MG 24 hr capsule Take 1 capsule (240 mg total) by mouth 2 (two) times daily.  60 capsule  5  . diltiazem (CARDIZEM) 30 MG tablet Take 1 tablet by mouth as needed.      . dronabinol (MARINOL) 2.5 MG capsule Take 1 capsule (2.5 mg total) by mouth 2 (two) times daily before lunch and supper.  60 capsule  0  . furosemide (LASIX) 40 MG tablet Take 40 mg by mouth 2 (two) times a week. Per patient's report, he is taking lasix 40 mg weekly.      . hydrocortisone ointment 0.5 % Apply 1 application topically 2 (two) times daily as needed for itching.      . metoprolol succinate (TOPROL-XL) 50 MG 24 hr tablet Take 1 tablet (50 mg total) by mouth 2 (two) times daily. Take with or immediately following a meal.  60 tablet  1  . omeprazole (PRILOSEC) 20 MG capsule Take 40 mg by mouth daily.      [name] oxyCODONE-acetaminophen (PERCOCET) 10-325 MG per tablet Take 1 tablet by mouth every 6 (six) hours as needed for pain.  120 tablet  0  . sodium bicarbonate 650 MG tablet Take 2 tablets (1,300 mg total) by mouth 2 (two) times daily.  120 tablet  5  . tiotropium (SPIRIVA) 18 MCG inhalation capsule  Place 1 capsule (18 mcg total) into inhaler and inhale daily.  30 capsule  11  . warfarin (COUMADIN) 5 MG tablet TAKE AS DIRECTED.  60 tablet  0   No current facility-administered medications for this visit.    Allergies  Allergen Reactions  . Digoxin And Related     Dig toxicity 2009  . Penicillins Rash    History   Social History  . Marital Status: Widowed    Spouse Name: N/A    Number of Children: 10  . Years of Education:  N/A   Occupational History  . RETIRED    Social History Main Topics  . Smoking status: Former Smoker -- 1.50 packs/day for 54 years    Types: Cigarettes    Quit date: 06/08/2013  . Smokeless tobacco: Never Used  . Alcohol Use: Yes     Comment: 07/02/2013 "h/o etoh abuse; stopped drinking ~ 2012"  . Drug Use: 1.00 per week    Special: Marijuana     Comment: 07/02/2013 "stopped marijuana ~ 1 1/2 months ago"  . Sexual Activity: Not Currently   Other Topics Concern  . Not on file   Social History Narrative   Works as a Architect part time   Smoker 1 pack last 1.5 days tobacco, smokes marijuana    Denies EtOH x 4 years (on 10/12/12)   12 kids    From [place]   [place] Veteran    12 grade education              Family History  Problem Relation Age of Onset  . Hypertension Mother   . Cancer Brother     Unsure of type.  . Asthma Mother   . Coronary artery disease Mother   . Colon cancer Neg Hx   . Lung cancer Neg Hx   . Prostate cancer Neg Hx     ROS-  All systems are reviewed and are negative except as outlined in the HPI above  Physical Exam: Filed Vitals:   08/02/13 1109  BP: 131/88  Pulse: 67  Height: '6\' 3"'  (1.905 m)  Weight: 168 lb (76.204 kg)    GEN- The patient is well appearing, alert and oriented x 3 today.   Head- normocephalic, atraumatic Eyes-  Sclera clear, conjunctiva pink Ears- hearing intact Oropharynx- clear Neck- supple, no JVP Lymph- no cervical lymphadenopathy Lungs- Clear to ausculation bilaterally,  normal work of breathing Heart- irregular rate and rhythm, no murmurs, rubs or gallops, PMI not laterally displaced GI- soft, NT, ND, + BS Extremities- no clubbing, cyanosis, or edema Neuro- strength and sensation are intact  ekg today reveals afib Pacemaker interrogation is reviewed  Assessment and Plan:  1. Afib/ atrial flutter-   Rate controlled. He has failed medical therapy with multaq, amiodarone, and tikosyn.  We will rate control long term.  2. Nonischemic CM- NYHA Class II presently No changes today  3. Renal failure- Dr Donato Heinz note is reviewed  4. Bradycardia/tachycardia syndrome Stable s/p PPM  5. HTN Stable Follow with above medicine changes  6. Tobacco I am very pleased that he has quit x 1 month  Return to se me in 3 months

## 2013-08-02 NOTE — Patient Instructions (Addendum)
Your physician wants you to follow-up in: 3 months with Dr Allred You will receive a reminder letter in the mail two months in advance. If you don't receive a letter, please call our office to schedule the follow-up appointment.  

## 2013-08-08 ENCOUNTER — Other Ambulatory Visit: Payer: Self-pay | Admitting: Internal Medicine

## 2013-08-10 ENCOUNTER — Other Ambulatory Visit: Payer: Self-pay | Admitting: *Deleted

## 2013-08-10 NOTE — Telephone Encounter (Signed)
Last refilled 2/25 Pt will be in clinic on Friday 2/27

## 2013-08-12 MED ORDER — OXYCODONE-ACETAMINOPHEN 10-325 MG PO TABS: 1.0000 | ORAL_TABLET | Freq: Four times a day (QID) | ORAL | Status: DC | PRN

## 2013-08-12 NOTE — Telephone Encounter (Signed)
Refill printed and signed - nurse to complete. 

## 2013-08-12 NOTE — Telephone Encounter (Signed)
Pt informed Rx is ready 

## 2013-08-12 NOTE — Telephone Encounter (Signed)
His son will pic up

## 2013-08-13 ENCOUNTER — Other Ambulatory Visit: Payer: Self-pay | Admitting: Internal Medicine

## 2013-08-13 ENCOUNTER — Ambulatory Visit (INDEPENDENT_AMBULATORY_CARE_PROVIDER_SITE_OTHER): Payer: Medicare HMO | Admitting: Internal Medicine

## 2013-08-13 ENCOUNTER — Ambulatory Visit (HOSPITAL_COMMUNITY)
Admission: RE | Admit: 2013-08-13 | Discharge: 2013-08-13 | Disposition: A | Discharge status: Home / Self Care | Payer: Medicare HMO | Source: Ambulatory Visit | Attending: Internal Medicine | Admitting: Internal Medicine

## 2013-08-13 VITALS — BP 102/65 | HR 70 | Temp 96.9°F | Ht 75.0 in | Wt 174.5 lb

## 2013-08-13 DIAGNOSIS — M25551 Pain in right hip: Secondary | ICD-10-CM

## 2013-08-13 DIAGNOSIS — Z96649 Presence of unspecified artificial hip joint: Secondary | ICD-10-CM | POA: Insufficient documentation

## 2013-08-13 DIAGNOSIS — M25552 Pain in left hip: Principal | ICD-10-CM

## 2013-08-13 DIAGNOSIS — R21 Rash and other nonspecific skin eruption: Secondary | ICD-10-CM

## 2013-08-13 DIAGNOSIS — M25559 Pain in unspecified hip: Secondary | ICD-10-CM

## 2013-08-13 MED ORDER — HYDROXYZINE HCL 25 MG PO TABS: 25.0000 mg | ORAL_TABLET | Freq: Three times a day (TID) | ORAL | Status: DC | PRN

## 2013-08-13 MED ORDER — TRIAMCINOLONE ACETONIDE 0.5 % EX OINT: 1.0000 "application " | TOPICAL_OINTMENT | Freq: Two times a day (BID) | CUTANEOUS | Status: DC

## 2013-08-13 NOTE — Assessment & Plan Note (Signed)
Etiology unclear although pt relates onset to Nicotine patches. Will try triamcinolone and hydroxyzine. If unresolved will need further work-up including ESR, shave biopsy vs scraping and/or referral to Dermatology.

## 2013-08-13 NOTE — Patient Instructions (Signed)
General Instructions:  We have prescribed an ointment and pill for your rash and itching. Get your hip XRays today. Please bring your medicines with you each time you come.   Medicines may be  Eye drops  Herbal   Vitamins  Pills  Seeing these help [place] take care of you.   Treatment Goals:  Goals (1 Years of Data) as of 08/13/13         As of Today 08/02/13 07/16/13 07/04/13 07/04/13     Blood Pressure    . Blood Pressure < 140/90  102/65 131/88 126/80 96/71 118/87      Progress Toward Treatment Goals:  Treatment Goal 08/13/2013  Blood pressure at goal  Stop smoking -  Prevent falls at goal    Self Care Goals & Plans:  Self Care Goal 08/13/2013  Manage my medications take my medicines as prescribed; bring my medications to every visit; refill my medications on time  Monitor my health -  Eat healthy foods eat more vegetables; eat baked foods instead of fried foods; drink diet soda or water instead of juice or soda  Be physically active take a walk every day    No flowsheet data found.   Care Management & Community Referrals:  Referral 11/25/2012  Referrals made for care management support -  Referrals made to community resources none

## 2013-08-13 NOTE — Progress Notes (Signed)
   Subjective:    Patient ID: [name], male    DOB: 1945/08/25, 68 y.o.   MRN: [id number]  HPI  Presents to clinic with complaints of bilateral hip pain.  Hx is significant for s/p bilateral hip replacement on chronic oxycodone-APAP 10-325 q6h as needed. Also states that since using nicotine patch in January 2015 that he subsequently developed a rash on his arm.  Has been using hydrocortisone as previously prescribed but states that the area still feels itchy at times.  Review of Systems  Constitutional: Negative.   HENT: Negative.   Respiratory: Negative.   Cardiovascular: Negative.   Gastrointestinal: Negative.   Genitourinary: Negative.   Musculoskeletal: Positive for arthralgias.  Skin: Positive for rash.  Neurological: Negative.   Hematological: Does not bruise/bleed easily.       Objective:   Physical Exam  Constitutional: He is oriented to person, place, and time. He appears well-developed and well-nourished. No distress.  HENT:  Head: Normocephalic and atraumatic.  Eyes: Conjunctivae and EOM are normal. Pupils are equal, round, and reactive to light.  Cardiovascular: Normal rate, regular rhythm and normal heart sounds.   Pulmonary/Chest: Effort normal and breath sounds normal.  Abdominal: Soft. Bowel sounds are normal.  Musculoskeletal: Normal range of motion.  Neurological: He is alert and oriented to person, place, and time.  Skin: Skin is warm and dry. No erythema.     Psychiatric: He has a normal mood and affect.          Assessment & Plan:  See separate problem-list for detailed A/P:  1. Bilateral hip pain: s/p hip replacement, prior Hip Xrays ordered per PCP but pt did not have them done -PCP recommend return to Ortho Surgeon Dr. Eddie [name] but pt states that he owes "thousands of dollars" -get Xrays today and likely refer to [facility] on follow-up  2. Skin rash: pt states rash started after nicotine patches, appears more eczematous clinically -triamcinolone  ointment and hydroxyzine prn -cont Eucerin cream and Dove soap

## 2013-08-13 NOTE — Assessment & Plan Note (Signed)
Will get Hip XRays today which were order by his PCP July 2014.  Pt will likely need referral to [facility] for further f/u given that he is unable to remit payment to his prior Ortho Surgeon.

## 2013-08-16 ENCOUNTER — Other Ambulatory Visit: Payer: Self-pay | Admitting: Internal Medicine

## 2013-08-16 NOTE — Progress Notes (Signed)
Case discussed with Dr. Schooler soon after the resident saw the patient.  We reviewed the resident's history and exam and pertinent patient test results.  I agree with the assessment, diagnosis, and plan of care documented in the resident's note. 

## 2013-08-16 NOTE — Telephone Encounter (Signed)
Please make an appointment with Dr. Elie Confer in the anticoagulation clinic within 1 week.

## 2013-08-17 ENCOUNTER — Other Ambulatory Visit: Payer: Self-pay | Admitting: *Deleted

## 2013-08-18 MED ORDER — DRONABINOL 2.5 MG PO CAPS: 2.5000 mg | ORAL_CAPSULE | Freq: Two times a day (BID) | ORAL | Status: DC

## 2013-08-18 NOTE — Telephone Encounter (Signed)
Refill approved - nurse to call in. 

## 2013-08-19 NOTE — Telephone Encounter (Signed)
Called to pharm 

## 2013-08-25 ENCOUNTER — Encounter: Payer: Self-pay | Admitting: Internal Medicine

## 2013-09-02 ENCOUNTER — Telehealth: Payer: Self-pay | Admitting: Internal Medicine

## 2013-09-02 NOTE — Telephone Encounter (Signed)
Called and spoke with patient, he picked up his Metoprolol 50mg  bid on 3/21 per [facility] and the Metoprolol 25mg  qd on 08/17/13 per [facility](it was on auto refill)  The 25mg  should have been canceled by the pharmacy when the new Rx was sent in from the hospital but it never was.  The patient is insistent he only hs the 25mg  pills and is taking it once daily and as needed for afib.  He also tells he he stopped his Furosemide because "he pees every 5 min". I told him he can not stop his medications without talking to [place] for [place] first as they are all important for his condition.  I let him know I would discuss with Dr Rayann Heman tomorrow and call him back with recommendations if any

## 2013-09-02 NOTE — Telephone Encounter (Signed)
Follow up     [name] forgot to leave vitals earlier bp 118/78; HR 78 and irregular; respir 20/ o2 sat 100 percent

## 2013-09-02 NOTE — Telephone Encounter (Signed)
New message     Please call pt and go over medication dosage with him.  Juana at the [facility] thinks he is taking his medications wrong.  Please call Denton Brick back and let her know what pt is supposed to be taking.

## 2013-09-03 ENCOUNTER — Other Ambulatory Visit: Payer: Self-pay | Admitting: *Deleted

## 2013-09-03 ENCOUNTER — Telehealth: Payer: Self-pay | Admitting: *Deleted

## 2013-09-03 NOTE — Telephone Encounter (Signed)
Pt calls for ortho referral, cannot go to [facility] or [facility] due to past bills and inability to pay.

## 2013-09-03 NOTE — Telephone Encounter (Addendum)
Discussed with Dr Laverna Peace to continue medications as he is currently taking.  If his weight increase by 3 pounds in a day will need to take a fluid pill.  He is aware and agrees with plan

## 2013-09-03 NOTE — Telephone Encounter (Signed)
He would like to pick up 09/08/2013

## 2013-09-06 NOTE — Telephone Encounter (Signed)
I think the best approach is to schedule an appointment in outpatient clinic so the X-rays can be reviewed, his symptoms and need for referral can be evaluated, and options for referral can be considered.

## 2013-09-07 ENCOUNTER — Telehealth: Payer: Self-pay | Admitting: *Deleted

## 2013-09-07 ENCOUNTER — Ambulatory Visit (INDEPENDENT_AMBULATORY_CARE_PROVIDER_SITE_OTHER): Payer: Medicare HMO | Admitting: Internal Medicine

## 2013-09-07 ENCOUNTER — Encounter: Payer: Self-pay | Admitting: Internal Medicine

## 2013-09-07 VITALS — BP 120/90 | HR 95 | Temp 97.5°F | Ht 75.0 in | Wt 175.6 lb

## 2013-09-07 DIAGNOSIS — R21 Rash and other nonspecific skin eruption: Secondary | ICD-10-CM

## 2013-09-07 DIAGNOSIS — M25559 Pain in unspecified hip: Secondary | ICD-10-CM

## 2013-09-07 DIAGNOSIS — H9190 Unspecified hearing loss, unspecified ear: Secondary | ICD-10-CM

## 2013-09-07 DIAGNOSIS — H9193 Unspecified hearing loss, bilateral: Secondary | ICD-10-CM

## 2013-09-07 DIAGNOSIS — M25551 Pain in right hip: Secondary | ICD-10-CM

## 2013-09-07 DIAGNOSIS — M25552 Pain in left hip: Principal | ICD-10-CM

## 2013-09-07 MED ORDER — OXYCODONE-ACETAMINOPHEN 10-325 MG PO TABS: 1.0000 | ORAL_TABLET | Freq: Four times a day (QID) | ORAL | Status: DC | PRN

## 2013-09-07 NOTE — Telephone Encounter (Signed)
Last refill was 08/12/2013 at [facility].  Refill printed and signed - nurse to complete.  Please have patient indicate which pharmacy he will be using for these refills; it should be the same pharmacy consistently.

## 2013-09-07 NOTE — Telephone Encounter (Signed)
Dr Marinda Elk while pt was here for appt this pm dr Hayes Ludwig came to me and ask for pt's script, i called the pharm which is [facility] and the pharmacist states that pt is getting script filled every 27 days for past 3 months. 2 months before every 29 days. [facility] other [facility] will fill narcs 3 days early. Dr Hayes Ludwig is referring him to [facility], i am suggesting and she agreed possibly until his appt at [place] if you could increase his amt to between 135 to 150 as pt told dr Hayes Ludwig that the pain is so bad now that some days he takes 5. And add to the script pt must wait 30 days between refills that this will end any problems. i told pt that if you were to increase the amount that whatever amt you increased it to that he would not get it until 30 days every month until [facility] notified you of their evaluation.

## 2013-09-07 NOTE — Telephone Encounter (Signed)
[facility] uses [facility] [address]

## 2013-09-07 NOTE — Patient Instructions (Addendum)
-  Follow up with Orthopedic Surgery at [facility]. The social worker will contact you in regards to transportation options.  -Follow up with Dermatology for further evaluation and treatment of your rash.  -You have been referred to an audiologist to have your hearing tested. They should call you with the appointment.  -Follow up in one month for routine care.   Please bring your medicines with you each time you come.   Medicines may be  Eye drops  Herbal   Vitamins  Pills  Seeing these help [place] take care of you.

## 2013-09-08 ENCOUNTER — Telehealth: Payer: Self-pay | Admitting: Licensed Clinical Social Worker

## 2013-09-08 DIAGNOSIS — H9190 Unspecified hearing loss, unspecified ear: Secondary | ICD-10-CM | POA: Insufficient documentation

## 2013-09-08 NOTE — Assessment & Plan Note (Signed)
He has persistent bilateral hip pain that has been gradually worsening, he is now unable to walk more than one block without experiencing excruciating pain.  Xray of bilateral hip reveals no evidence of evidence of hardware failure or loosening. He has osseous fragments in the are or the bilateral trochanter but of unclear clinical significance.  He is unable to pay his balance with his Orthopedic Surgeon here in [place] so he will be referred to [facility].  -Referred to CSW for help with transportation to [facility].   Due to his increased pain, he now requires Percocet 10-325mg  q4 hours during the day (5 tablets per day) and has run out of his #120 per month tablets needing earlier refill. The patient stated that he had 4 tablets left on 4/21 that would last until 4/22.  Pt had prescription for Percocet 10mg -325mg  q6hr PRN pain #120 that was previously printed and kept in the medication contract folder and that was given to him on 4/21.   Appreciate Helen's assistance in sending a message to the patient's PCP, Dr. Marinda Elk in regards to this request for early refill and in regards to the pt's request to increase the monthly Rx of Percocet from #120. .   Addendum: The patient returned to clinic on 4/22 and requested authorization for his Pharmacy to have his prescription filled early as he was about to run out of his Percocet. No updated note from his PCP was available in Epic since Helen's message on 4/21. The pt was allowed to have this early refill as he clearly had severe pain during my exam. Pt will await answer from PCP to increase the monthly quantity of Percocet tablets.

## 2013-09-08 NOTE — Assessment & Plan Note (Signed)
Towards the end of this visit he also complained of intermittent decreased hearing for months now. He had no cerumen impaction on physical exam.  -Referred to audiometry test

## 2013-09-08 NOTE — Assessment & Plan Note (Signed)
His pruritic papular rash persists despite tx with Kenalog cream.  Referred to Dermatology for possible skin biopsy and further eval and tx

## 2013-09-08 NOTE — Progress Notes (Signed)
Subjective:    Patient ID: [name], male    DOB: 1946-01-22, 68 y.o.   MRN: [id number]  HPI [name] is a 68 yr old man with PMH significant for OA with bilateral hip replacement and HTN who presents for evaluation of worsening bilateral hip pain. He reports that for the past few months he is excruciating pain with walking even one block. He is able to ambulate with a cane for stabilization of his gait and denies recent falls but reports that he avoid walking secondary to the pain. The pain is localized in his bilateral posterior hips and does not radiate to his legs or knees. The pain improves with rest/sitting and worsens with standing and walking. He is on medication contract for Percocet 10-325mg  q6h PRN for pain #120 but states that he has been taken it every 4 hours during the day, requiring 5 tablets per day due to the increase in his pain intensity. Consequentially, he has been running out of Percocet a few days prior to his scheduled refill date.  He had his hip replacement surgeries years ago and was doing well until 2 years ago when his hips started hurting. He has been unable to return to his Orthopedic Surgeon as he owes "thousands" to that practice and cannot be seen there until his balance is paid in full.  He was recently seen at the [facility] and had Xrays of his bilateral hips ordered and we discussed the findings of these images during this visit. The impression for these images read: "post bilateral hip replacements without evidence of hardware failure or loosening." The report also mentions the the presence osseous fragments noted about the right and the the left great trochanters.  He also complains of a pruritic rash in his bilateral shoulders and flanks that has been present since his hospitalization in January. The Kenalog cream and the hydroxyzine prescribed to him during his last visit improved the itching but die not improve the rash itself.   Review of Systems    Constitutional: Negative for fever, chills, activity change, appetite change and unexpected weight change.  Respiratory: Negative for cough and shortness of breath.   Cardiovascular: Negative for chest pain, palpitations and leg swelling.  Gastrointestinal: Negative for abdominal pain.  Genitourinary: Negative for dysuria.  Musculoskeletal: Positive for arthralgias and gait problem.  Neurological: Negative for dizziness, weakness, light-headedness and headaches.  Psychiatric/Behavioral: Negative for agitation.       Objective:   Physical Exam  Nursing note and vitals reviewed. Constitutional: He is oriented to person, place, and time. He appears well-developed and well-nourished. No distress.  Sitting in wheelchair  Cardiovascular: Normal rate and regular rhythm.   Pulmonary/Chest: Effort normal and breath sounds normal. No respiratory distress. He has no wheezes. He has no rales.  Abdominal: Soft.  Musculoskeletal: He exhibits tenderness. He exhibits no edema.  TTP of posterior bilateral hips, localized. Pt unable to perform hip flexion 2/2 hip pain. Pt ambulated with assistance of cane, has slow pace, with stiff hips.   Neurological: He is alert and oriented to person, place, and time.  Skin: Skin is warm and dry. He is not diaphoretic.  Papular rash with darker skin discoloration over his shoulders, scapulae, and flanks with symmetric distribution, no vesicles, scattered excoriation marks, no bleeding or discharge, no surrounding edema or erythema. Not tender to palpation.   Psychiatric: He has a normal mood and affect. His behavior is normal.  Assessment & Plan:

## 2013-09-08 NOTE — Telephone Encounter (Signed)
[name] was referred to CSW for assistance with transportation to [facility] appointment.  [name] has [employer] which provides transportation through [employer].  CSW placed call to [name] left message with contact information to [place].  CSW placed called to pt.  CSW left message requesting return call. CSW provided contact hours and phone number.

## 2013-09-08 NOTE — Telephone Encounter (Signed)
[name] returned call to [name].  Pt states appt has not been scheduled yet.  CSW will send [name] information from [employer] [employer] regarding [place] transportation.  Pt aware CSW is available to assist as needed.

## 2013-09-09 MED ORDER — OXYCODONE-ACETAMINOPHEN 10-325 MG PO TABS: 1.0000 | ORAL_TABLET | ORAL | Status: DC | PRN

## 2013-09-09 NOTE — Progress Notes (Signed)
INTERNAL MEDICINE TEACHING ATTENDING ADDENDUM - Joanann Mies, MD: I reviewed and discussed at the time of visit with the resident Dr. Kennerly, the patient's medical history, physical examination, diagnosis and results of tests and treatment and I agree with the patient's care as documented. 

## 2013-09-09 NOTE — Telephone Encounter (Signed)
I reviewed Dr. Bernadene Bell note and spoke with Dr. Hayes Ludwig, and I agree with increasing the quantity to #140 per month and revising the instructions to one tablet every 4 hours PRN pain, not to exceed 5 doses in a 24 hour period ; since he had an early refill approved yesterday for #120, we need to find out from the pharmacy how to make the transition to the new dose and quantity.  We also need to update his medication contract and have him sign it.

## 2013-09-28 ENCOUNTER — Other Ambulatory Visit: Payer: Self-pay | Admitting: *Deleted

## 2013-09-28 NOTE — Telephone Encounter (Signed)
Patient's weight on 4/21 had returned to target level on Marinol; according to Dr. Lynnae January, she discussed stopping Marinol with patient at the time of his last visit, with a plan to see how his appetite and weight do with off of the medication.  Please advise patient to stop Marinol and to weigh himself weekly; he should let us know right away if his weight starts to decline.  Also, please schedule a follow-up appointment with me.

## 2013-09-29 ENCOUNTER — Other Ambulatory Visit: Payer: Self-pay | Admitting: *Deleted

## 2013-09-29 MED ORDER — OXYCODONE-ACETAMINOPHEN 10-325 MG PO TABS: 1.0000 | ORAL_TABLET | ORAL | Status: DC | PRN

## 2013-09-29 NOTE — Telephone Encounter (Signed)
It is OK to schedule in [facility] within 1 month with any provider, and to schedule in my clinic when available.

## 2013-09-29 NOTE — Telephone Encounter (Signed)
Pt's last script was for #120 and filled on 4/22, pt states he has appr 8 tablets left. Will need new script for 140. Made an appt with dr brown for 5/19 Explained marinol not being filled, need to weigh self weekly, same day of week, he is agreeable Also would like to know [facility]

## 2013-09-29 NOTE — Telephone Encounter (Signed)
Refill printed and signed - nurse to complete.  Please have RN follow up on the status of the referral.

## 2013-09-30 NOTE — Telephone Encounter (Signed)
Pt rtc from this am, informed script ready and next will be 10/31/2013

## 2013-10-05 ENCOUNTER — Ambulatory Visit (INDEPENDENT_AMBULATORY_CARE_PROVIDER_SITE_OTHER): Payer: Commercial Managed Care - HMO | Admitting: Internal Medicine

## 2013-10-05 ENCOUNTER — Encounter: Payer: Self-pay | Admitting: Internal Medicine

## 2013-10-05 VITALS — BP 125/82 | HR 86 | Temp 97.5°F | Ht 75.0 in | Wt 179.5 lb

## 2013-10-05 DIAGNOSIS — Z7901 Long term (current) use of anticoagulants: Secondary | ICD-10-CM

## 2013-10-05 DIAGNOSIS — M25551 Pain in right hip: Secondary | ICD-10-CM

## 2013-10-05 DIAGNOSIS — M25552 Pain in left hip: Principal | ICD-10-CM

## 2013-10-05 DIAGNOSIS — I10 Essential (primary) hypertension: Secondary | ICD-10-CM

## 2013-10-05 DIAGNOSIS — M25559 Pain in unspecified hip: Secondary | ICD-10-CM

## 2013-10-05 LAB — POCT INR: INR: 1.7

## 2013-10-05 MED ORDER — FUROSEMIDE 40 MG PO TABS: 40.0000 mg | ORAL_TABLET | ORAL | Status: DC

## 2013-10-05 NOTE — Assessment & Plan Note (Signed)
BP Readings from Last 3 Encounters:  10/05/13 125/82  09/07/13 120/90  08/13/13 102/65    Lab Results  Component Value Date   NA 143 07/16/2013   K 4.5 07/16/2013   CREATININE 1.72* 07/16/2013    Assessment: Blood pressure control: controlled Progress toward BP goal:  at goal Comments: BP well-controlled  Plan: Medications:  Continue diltiazem 240, lasix 40 mg (twice/week), metoprolol 50 BID Educational resources provided:   Self management tools provided:   Other plans: Reassess at next visit

## 2013-10-05 NOTE — Assessment & Plan Note (Addendum)
The patient's chronic hip pain has improved since increasing his quantity of percocet.  We signed a new medication contract today, and a copy was given to the patient. -Continue Percocet 10-325, #140 per month -Confirmed with nursing, we are still awaiting appointment from [place]

## 2013-10-05 NOTE — Patient Instructions (Addendum)
General Instructions: For your chronic pain, we have signed a new pain contract today, which will be on file in your record.  Your INR level was low today, at 1.7.  We are changing your Warfarin dosing: -on Mondays and Fridays, take 10 mg once per day -all other days, take 7.5 mg once per day  We are looking into the referral to [facility].  Please return for a follow-up visit in 2 months. -please schedule a visit with Dr. Elie Confer in 2 weeks to adjust your coumadin  Please bring your medicines with you each time you come to clinic.  Medicines may include prescription medications, over-the-counter medications, herbal remedies, eye drops, vitamins, or other pills.   Progress Toward Treatment Goals:  Treatment Goal 10/05/2013  Blood pressure at goal  Stop smoking -  Prevent falls unchanged    Self Care Goals & Plans:  Self Care Goal 09/07/2013  Manage my medications take my medicines as prescribed; bring my medications to every visit; refill my medications on time; follow the sick day instructions if I am sick  Monitor my health -  Eat healthy foods eat more vegetables; eat fruit for snacks and desserts; eat foods that are low in salt  Be physically active find an activity I enjoy    No flowsheet data found.   Care Management & Community Referrals:  Referral 10/05/2013  Referrals made for care management support none needed  Referrals made to community resources -

## 2013-10-05 NOTE — Progress Notes (Signed)
HPI The patient is a 68 y.o. male with a history of NICM, tobacco abuse, COPD, CKD, bilateral hip replacement, presenting for a follow-up visit for hip pain.  At the patient's last visit, he was noted to have worsening bilateral hip pain, with x-rays revealing no new pathology.  The patient's percocet prescription was increased to #140/month, and the patient was referred to [place] (pt notes he hasn't been contacted about an appointment).    The patient's BP is well-controlled.  The patient has a history of afib, on coumadin.  The patient notes he has not seen Dr. Elie Confer in several months, due to transportation issues.  The patient was noted to have a rash at his last visit around his shoulders and back.  The patient went to Dermatology [facility]), and was prescribed a cream (?hydrocortisone) for the area, which has improved the area.  He does not know what diagnosis he was given for this.  We will call for records from this visit.  ROS: General: no fevers, chills, changes in weight, changes in appetite Skin: see HPI HEENT: no blurry vision, hearing changes, sore throat Pulm: no dyspnea, coughing, wheezing CV: no chest pain, palpitations, shortness of breath Abd: no abdominal pain, nausea/vomiting, diarrhea/constipation GU: no dysuria, hematuria, polyuria Ext: see HPI Neuro: no weakness, numbness, or tingling  Filed Vitals:   10/05/13 1505  BP: 125/82  Pulse: 86  Temp: 97.5 F (36.4 C)    PEX General: alert, cooperative, and in no apparent distress HEENT: pupils equal round and reactive to light, vision grossly intact, oropharynx clear and non-erythematous  Neck: supple Lungs: clear to ascultation bilaterally, normal work of respiration, no wheezes, rales, ronchi Heart: irregular rate and rhythm, no murmurs, gallops, or rubs Abdomen: soft, non-tender, non-distended, normal bowel sounds Extremities: no cyanosis, clubbing, or edema Neurologic: alert & oriented  X3, cranial nerves II-XII intact, strength grossly intact, sensation intact to light touch  Current Outpatient Prescriptions on File Prior to Visit  Medication Sig Dispense Refill  . albuterol (PROVENTIL HFA;VENTOLIN HFA) 108 (90 BASE) MCG/ACT inhaler Inhale 2 puffs into the lungs every 6 (six) hours as needed for wheezing or shortness of breath.      [name] buPROPion (WELLBUTRIN) 75 MG tablet Take 1 tablet (75 mg total) by mouth 2 (two) times daily.  60 tablet  5  . diltiazem (CARDIZEM CD) 240 MG 24 hr capsule Take 1 capsule (240 mg total) by mouth 2 (two) times daily.  60 capsule  5  . diltiazem (CARDIZEM) 30 MG tablet Take 1 tablet by mouth as needed.      . dronabinol (MARINOL) 2.5 MG capsule Take 1 capsule (2.5 mg total) by mouth 2 (two) times daily before lunch and supper.  60 capsule  0  . furosemide (LASIX) 40 MG tablet Take 40 mg by mouth 2 (two) times a week. Per patient's report, he is taking lasix 40 mg weekly.      . hydrocortisone ointment 0.5 % Apply 1 application topically 2 (two) times daily as needed for itching.      . hydrOXYzine (ATARAX/VISTARIL) 25 MG tablet Take 1 tablet (25 mg total) by mouth 3 (three) times daily as needed.  30 tablet  0  . metoprolol succinate (TOPROL-XL) 50 MG 24 hr tablet Take 1 tablet (50 mg total) by mouth 2 (two) times daily. Take with or immediately following a meal.  60 tablet  1  . omeprazole (PRILOSEC) 20 MG capsule Take 2 capsules (40 mg total)  by mouth daily.  60 capsule  6  . oxyCODONE-acetaminophen (PERCOCET) 10-325 MG per tablet Take 1 tablet by mouth every 4 (four) hours as needed for pain. Do not take more than 5 doses in a 24 hour period.  140 tablet  0  . sodium bicarbonate 650 MG tablet Take 2 tablets (1,300 mg total) by mouth 2 (two) times daily.  120 tablet  5  . tiotropium (SPIRIVA) 18 MCG inhalation capsule Place 1 capsule (18 mcg total) into inhaler and inhale daily.  30 capsule  11  . triamcinolone ointment (KENALOG) 0.5 % Apply 1  application topically 2 (two) times daily.  30 g  0  . warfarin (COUMADIN) 5 MG tablet TAKE AS DIRECTED.  60 tablet  0   No current facility-administered medications on file prior to visit.    Assessment/Plan

## 2013-10-05 NOTE — Assessment & Plan Note (Addendum)
The patient has a history of A. Fib, on warfarin, though he has not seen Dr. Elie Confer in several months. INR today is 1.7 on warfarin 7.5 mg daily (total weekly dose 52.5 mg).  Based on the re-ly algorithm, we will increase total weekly dose by 10% today. -increase warfarin to 10 mg on Monday and Friday, and 7.5 mg every other day of the week -f/u with Dr. Elie Confer in 2 weeks

## 2013-10-07 NOTE — Progress Notes (Signed)
Case discussed with Dr. Brown soon after the resident saw the patient.  We reviewed the resident's history and exam and pertinent patient test results.  I agree with the assessment, diagnosis, and plan of care documented in the resident's note. 

## 2013-10-12 NOTE — Addendum Note (Signed)
Addended by: Marcelino Duster on: 10/12/2013 11:46 AM   Modules accepted: Orders

## 2013-10-18 ENCOUNTER — Ambulatory Visit: Payer: Commercial Managed Care - HMO

## 2013-10-22 ENCOUNTER — Telehealth: Payer: Self-pay | Admitting: Licensed Clinical Social Worker

## 2013-10-22 NOTE — Telephone Encounter (Signed)
Pt requesting letter to provide to [employer] to schedule his out of county transportation.  CSW placed call to [employer] to obtain information needed and fax number.  [employer] has no record of pt calling nor his physical address.  [employer] requesting [name] to contact his [employer] number to confirm benefit [phone] and need for Prior Auth.  CSW placed call to Mr. Daughtridge, pt states "his worker" called to schedule transportation.  Pt requesting to have "his worker" call CSW back.

## 2013-10-25 NOTE — Telephone Encounter (Signed)
CSW did not receive call back from pt or worker.  CSW placed call to [name] today and provided information from [place].  Pt to contact his Customer service and confirm letter is required.  Then schedule with [employer].  Phone numbers left on voicemail. [employer] : [phone] [employer]: [phone]

## 2013-10-26 NOTE — Telephone Encounter (Signed)
Mr. Delamater placed call to CSW to state letter is required if more than 25 miles.  Pt states he will have his Building surveyor, Lauren call CSW to provide fax number and information required.  CSW obtained pt's physical address.  Pt is approximately 35 miles to [facility].

## 2013-10-27 ENCOUNTER — Other Ambulatory Visit: Payer: Self-pay | Admitting: *Deleted

## 2013-10-28 ENCOUNTER — Encounter: Payer: Self-pay | Admitting: Licensed Clinical Social Worker

## 2013-10-28 MED ORDER — OXYCODONE-ACETAMINOPHEN 10-325 MG PO TABS: 1.0000 | ORAL_TABLET | ORAL | Status: DC | PRN

## 2013-10-28 NOTE — Telephone Encounter (Signed)
Refill printed and signed and provided to refill nurse. 

## 2013-10-28 NOTE — Telephone Encounter (Signed)
Rx ready - pt called, no answer, message left.

## 2013-10-31 ENCOUNTER — Other Ambulatory Visit: Payer: Self-pay | Admitting: Internal Medicine

## 2013-11-03 NOTE — Telephone Encounter (Signed)
CSW received call from [place] Manager/Social Worker to provide information on prior auth for [facility] covered medical transportation beyond the 25 mi limit.  CSW attempted to contact using numbers Lauren provided but was instructed pts case is managed by [employer] ph# [phone], fax# [phone].  Letter faxed to number above.

## 2013-11-10 ENCOUNTER — Ambulatory Visit: Payer: Medicare HMO | Attending: Internal Medicine | Admitting: Audiology

## 2013-11-12 ENCOUNTER — Encounter: Payer: Medicare HMO | Admitting: Internal Medicine

## 2013-11-15 ENCOUNTER — Encounter: Payer: Medicare HMO | Admitting: Internal Medicine

## 2013-11-16 ENCOUNTER — Encounter: Payer: Self-pay | Admitting: Internal Medicine

## 2013-11-23 ENCOUNTER — Telehealth: Payer: Self-pay | Admitting: *Deleted

## 2013-11-25 ENCOUNTER — Telehealth: Payer: Self-pay | Admitting: *Deleted

## 2013-11-25 ENCOUNTER — Telehealth: Payer: Self-pay | Admitting: Internal Medicine

## 2013-11-25 MED ORDER — OXYCODONE-ACETAMINOPHEN 10-325 MG PO TABS: 1.0000 | ORAL_TABLET | ORAL | Status: DC | PRN

## 2013-11-25 NOTE — Telephone Encounter (Signed)
I received a page on the on call pager from [name]'s phone number but no answer on call back. I left a voicemail to be called back if still needed.

## 2013-11-25 NOTE — Telephone Encounter (Signed)
Refill printed and signed and provided to refill nurse. 

## 2013-11-25 NOTE — Telephone Encounter (Signed)
Please find out when the last refill was dispensed, and also find out from patient when he will run out.

## 2013-11-25 NOTE — Telephone Encounter (Signed)
The pharmacist stated the pt wanted his last  Percocet rx refilled on the 11th so he was calling ahead this time; stated pt became upset also. Rx has do not filled until the 13th which is a 30 day supply per Dr Marinda Elk. Tried calling pt - no answer. Unable to find out if pt will be out of medication by the 11th.

## 2013-11-25 NOTE — Telephone Encounter (Signed)
Pt aware.

## 2013-11-25 NOTE — Telephone Encounter (Signed)
I called patient and spoke with him about his request for an early refill of his oxycodone/acetaminophen.  He reports that he has taken some additional doses in the past month and only has 2 tablets left, so he requested an early refill.  I emphasized to him that he must follow directions on the use of pain medication as outlined in the notes and in his revised pain contract.  I agreed to provide an early refill today, but I also informed him that this must last for 30 days; I advised him that if his pain is not adequately controlled, he should make an appointment and be seen in the clinic for reassessment of his pain regimen.  He missed his appointment at the orthopedic clinic at [facility], and says that he is trying to reschedule the appointment.  I called the pharmacy and gave verbal approval for the early refill.

## 2013-12-10 ENCOUNTER — Inpatient Hospital Stay (HOSPITAL_COMMUNITY)
Admission: EM | Admit: 2013-12-10 | Discharge: 2013-12-20 | DRG: 253 | Disposition: A | Discharge status: Skilled Nursing Facility | Payer: Medicare HMO | Attending: Vascular Surgery | Admitting: Vascular Surgery

## 2013-12-10 ENCOUNTER — Encounter (HOSPITAL_COMMUNITY): Payer: Self-pay | Admitting: Emergency Medicine

## 2013-12-10 ENCOUNTER — Emergency Department (HOSPITAL_COMMUNITY): Payer: Medicare HMO

## 2013-12-10 ENCOUNTER — Encounter (HOSPITAL_COMMUNITY)
Admission: EM | Disposition: A | Discharge status: Skilled Nursing Facility | Payer: Self-pay | Source: Home / Self Care | Attending: Vascular Surgery

## 2013-12-10 ENCOUNTER — Encounter (HOSPITAL_COMMUNITY): Payer: Medicare HMO | Admitting: Anesthesiology

## 2013-12-10 ENCOUNTER — Emergency Department (HOSPITAL_COMMUNITY): Payer: Medicare HMO | Admitting: Anesthesiology

## 2013-12-10 DIAGNOSIS — G589 Mononeuropathy, unspecified: Secondary | ICD-10-CM | POA: Diagnosis present

## 2013-12-10 DIAGNOSIS — I4821 Permanent atrial fibrillation: Secondary | ICD-10-CM

## 2013-12-10 DIAGNOSIS — F1210 Cannabis abuse, uncomplicated: Secondary | ICD-10-CM | POA: Diagnosis present

## 2013-12-10 DIAGNOSIS — N183 Chronic kidney disease, stage 3 unspecified: Secondary | ICD-10-CM

## 2013-12-10 DIAGNOSIS — Z91199 Patient's noncompliance with other medical treatment and regimen due to unspecified reason: Secondary | ICD-10-CM | POA: Diagnosis not present

## 2013-12-10 DIAGNOSIS — E0590 Thyrotoxicosis, unspecified without thyrotoxic crisis or storm: Secondary | ICD-10-CM | POA: Diagnosis present

## 2013-12-10 DIAGNOSIS — Z825 Family history of asthma and other chronic lower respiratory diseases: Secondary | ICD-10-CM

## 2013-12-10 DIAGNOSIS — Z7901 Long term (current) use of anticoagulants: Secondary | ICD-10-CM

## 2013-12-10 DIAGNOSIS — F1011 Alcohol abuse, in remission: Secondary | ICD-10-CM | POA: Diagnosis present

## 2013-12-10 DIAGNOSIS — Z888 Allergy status to other drugs, medicaments and biological substances status: Secondary | ICD-10-CM

## 2013-12-10 DIAGNOSIS — I428 Other cardiomyopathies: Secondary | ICD-10-CM | POA: Diagnosis present

## 2013-12-10 DIAGNOSIS — N182 Chronic kidney disease, stage 2 (mild): Secondary | ICD-10-CM | POA: Diagnosis present

## 2013-12-10 DIAGNOSIS — N179 Acute kidney failure, unspecified: Secondary | ICD-10-CM | POA: Diagnosis present

## 2013-12-10 DIAGNOSIS — I739 Peripheral vascular disease, unspecified: Secondary | ICD-10-CM | POA: Diagnosis present

## 2013-12-10 DIAGNOSIS — Z950 Presence of cardiac pacemaker: Secondary | ICD-10-CM

## 2013-12-10 DIAGNOSIS — I10 Essential (primary) hypertension: Secondary | ICD-10-CM

## 2013-12-10 DIAGNOSIS — I509 Heart failure, unspecified: Secondary | ICD-10-CM | POA: Diagnosis present

## 2013-12-10 DIAGNOSIS — Z86718 Personal history of other venous thrombosis and embolism: Secondary | ICD-10-CM | POA: Diagnosis not present

## 2013-12-10 DIAGNOSIS — Z96649 Presence of unspecified artificial hip joint: Secondary | ICD-10-CM | POA: Diagnosis not present

## 2013-12-10 DIAGNOSIS — R0602 Shortness of breath: Secondary | ICD-10-CM | POA: Diagnosis present

## 2013-12-10 DIAGNOSIS — Z87891 Personal history of nicotine dependence: Secondary | ICD-10-CM | POA: Diagnosis not present

## 2013-12-10 DIAGNOSIS — Z8249 Family history of ischemic heart disease and other diseases of the circulatory system: Secondary | ICD-10-CM

## 2013-12-10 DIAGNOSIS — I4891 Unspecified atrial fibrillation: Secondary | ICD-10-CM | POA: Diagnosis present

## 2013-12-10 DIAGNOSIS — M1990 Unspecified osteoarthritis, unspecified site: Secondary | ICD-10-CM | POA: Diagnosis present

## 2013-12-10 DIAGNOSIS — E875 Hyperkalemia: Secondary | ICD-10-CM | POA: Diagnosis present

## 2013-12-10 DIAGNOSIS — F319 Bipolar disorder, unspecified: Secondary | ICD-10-CM | POA: Diagnosis present

## 2013-12-10 DIAGNOSIS — I129 Hypertensive chronic kidney disease with stage 1 through stage 4 chronic kidney disease, or unspecified chronic kidney disease: Secondary | ICD-10-CM | POA: Diagnosis present

## 2013-12-10 DIAGNOSIS — Z9981 Dependence on supplemental oxygen: Secondary | ICD-10-CM | POA: Diagnosis not present

## 2013-12-10 DIAGNOSIS — I959 Hypotension, unspecified: Secondary | ICD-10-CM | POA: Diagnosis not present

## 2013-12-10 DIAGNOSIS — J4489 Other specified chronic obstructive pulmonary disease: Secondary | ICD-10-CM | POA: Diagnosis present

## 2013-12-10 DIAGNOSIS — Z880 Allergy status to penicillin: Secondary | ICD-10-CM | POA: Diagnosis not present

## 2013-12-10 DIAGNOSIS — L84 Corns and callosities: Secondary | ICD-10-CM | POA: Diagnosis present

## 2013-12-10 DIAGNOSIS — Z9119 Patient's noncompliance with other medical treatment and regimen: Secondary | ICD-10-CM

## 2013-12-10 DIAGNOSIS — K219 Gastro-esophageal reflux disease without esophagitis: Secondary | ICD-10-CM | POA: Diagnosis present

## 2013-12-10 DIAGNOSIS — I5042 Chronic combined systolic (congestive) and diastolic (congestive) heart failure: Secondary | ICD-10-CM | POA: Diagnosis present

## 2013-12-10 DIAGNOSIS — I999 Unspecified disorder of circulatory system: Secondary | ICD-10-CM

## 2013-12-10 DIAGNOSIS — M25551 Pain in right hip: Secondary | ICD-10-CM

## 2013-12-10 DIAGNOSIS — M25552 Pain in left hip: Secondary | ICD-10-CM

## 2013-12-10 DIAGNOSIS — D62 Acute posthemorrhagic anemia: Secondary | ICD-10-CM | POA: Diagnosis not present

## 2013-12-10 DIAGNOSIS — M79609 Pain in unspecified limb: Secondary | ICD-10-CM

## 2013-12-10 DIAGNOSIS — I743 Embolism and thrombosis of arteries of the lower extremities: Principal | ICD-10-CM | POA: Diagnosis present

## 2013-12-10 DIAGNOSIS — I482 Chronic atrial fibrillation, unspecified: Secondary | ICD-10-CM

## 2013-12-10 DIAGNOSIS — R209 Unspecified disturbances of skin sensation: Secondary | ICD-10-CM

## 2013-12-10 DIAGNOSIS — J449 Chronic obstructive pulmonary disease, unspecified: Secondary | ICD-10-CM | POA: Diagnosis present

## 2013-12-10 HISTORY — PX: EMBOLECTOMY: SHX44

## 2013-12-10 HISTORY — PX: INTRAOPERATIVE ARTERIOGRAM: SHX5157

## 2013-12-10 LAB — PRO B NATRIURETIC PEPTIDE: Pro B Natriuretic peptide (BNP): 6010 pg/mL — ABNORMAL HIGH (ref 0–125)

## 2013-12-10 LAB — COMPREHENSIVE METABOLIC PANEL
ALK PHOS: 89 U/L (ref 39–117)
ALT: 11 U/L (ref 0–53)
AST: 20 U/L (ref 0–37)
Albumin: 4.4 g/dL (ref 3.5–5.2)
Anion gap: 16 — ABNORMAL HIGH (ref 5–15)
BILIRUBIN TOTAL: 1.1 mg/dL (ref 0.3–1.2)
BUN: 23 mg/dL (ref 6–23)
CHLORIDE: 105 meq/L (ref 96–112)
CO2: 22 meq/L (ref 19–32)
Calcium: 9.8 mg/dL (ref 8.4–10.5)
Creatinine, Ser: 1.73 mg/dL — ABNORMAL HIGH (ref 0.50–1.35)
GFR calc Af Amer: 45 mL/min — ABNORMAL LOW (ref >90)
GFR, EST NON AFRICAN AMERICAN: 39 mL/min — AB (ref >90)
Glucose, Bld: 86 mg/dL (ref 70–99)
POTASSIUM: 7 meq/L — AB (ref 3.7–5.3)
SODIUM: 143 meq/L (ref 137–147)
Total Protein: 7.5 g/dL (ref 6.0–8.3)

## 2013-12-10 LAB — CBC
HEMATOCRIT: 48.8 % (ref 39.0–52.0)
Hemoglobin: 16 g/dL (ref 13.0–17.0)
MCH: 30.2 pg (ref 26.0–34.0)
MCHC: 32.8 g/dL (ref 30.0–36.0)
MCV: 92.2 fL (ref 78.0–100.0)
Platelets: 137 10*3/uL — ABNORMAL LOW (ref 150–400)
RBC: 5.29 MIL/uL (ref 4.22–5.81)
RDW: 14.9 % (ref 11.5–15.5)
WBC: 8.4 10*3/uL (ref 4.0–10.5)

## 2013-12-10 LAB — TROPONIN I: Troponin I: <0.3 ng/mL (ref <0.30)

## 2013-12-10 LAB — GLUCOSE, CAPILLARY: GLUCOSE-CAPILLARY: 74 mg/dL (ref 70–99)

## 2013-12-10 LAB — PROTIME-INR
INR: 0.98 (ref 0.00–1.49)
Prothrombin Time: 13 seconds (ref 11.6–15.2)

## 2013-12-10 LAB — POTASSIUM: Potassium: 4.6 mEq/L (ref 3.7–5.3)

## 2013-12-10 SURGERY — EMBOLECTOMY
Anesthesia: General | Site: Leg Upper | Laterality: Right

## 2013-12-10 MED ORDER — HYDROMORPHONE HCL PF 1 MG/ML IJ SOLN: 0.2500 mg | INTRAMUSCULAR | Status: DC | PRN

## 2013-12-10 MED ORDER — 0.9 % SODIUM CHLORIDE (POUR BTL) OPTIME
TOPICAL | Status: DC | PRN
  Administered 2013-12-10: 2000 mL

## 2013-12-10 MED ORDER — KCL IN DEXTROSE-NACL 20-5-0.45 MEQ/L-%-% IV SOLN
INTRAVENOUS | Status: DC
  Administered 2013-12-11: via INTRAVENOUS
  Administered 2013-12-15: 10 mL/h via INTRAVENOUS
  Filled 2013-12-10 (×20): qty 1000

## 2013-12-10 MED ORDER — GLYCOPYRROLATE 0.2 MG/ML IJ SOLN
INTRAMUSCULAR | Status: DC | PRN
  Administered 2013-12-10: 0.6 mg via INTRAVENOUS

## 2013-12-10 MED ORDER — NEOSTIGMINE METHYLSULFATE 10 MG/10ML IV SOLN
INTRAVENOUS | Status: DC | PRN
  Administered 2013-12-10: 5 mg via INTRAVENOUS

## 2013-12-10 MED ORDER — CALCIUM GLUCONATE 10 % IV SOLN
INTRAVENOUS | Status: DC | PRN
  Administered 2013-12-10: 1 g via INTRAVENOUS

## 2013-12-10 MED ORDER — LACTATED RINGERS IV SOLN
INTRAVENOUS | Status: DC | PRN
  Administered 2013-12-10: 21:00:00 via INTRAVENOUS

## 2013-12-10 MED ORDER — SODIUM CHLORIDE 0.9 % IV SOLN: 500.0000 mL | Freq: Once | INTRAVENOUS | Status: AC | PRN

## 2013-12-10 MED ORDER — PROPOFOL 10 MG/ML IV BOLUS
INTRAVENOUS | Status: AC
  Filled 2013-12-10: qty 20

## 2013-12-10 MED ORDER — SUCCINYLCHOLINE CHLORIDE 20 MG/ML IJ SOLN
INTRAMUSCULAR | Status: DC | PRN
  Administered 2013-12-10: 120 mg via INTRAVENOUS

## 2013-12-10 MED ORDER — OXYCODONE HCL 5 MG/5ML PO SOLN: 5.0000 mg | Freq: Once | ORAL | Status: AC | PRN

## 2013-12-10 MED ORDER — DEXAMETHASONE SODIUM PHOSPHATE 4 MG/ML IJ SOLN
INTRAMUSCULAR | Status: AC
  Filled 2013-12-10: qty 2

## 2013-12-10 MED ORDER — FENTANYL CITRATE 0.05 MG/ML IJ SOLN
INTRAMUSCULAR | Status: DC | PRN
  Administered 2013-12-10: 150 ug via INTRAVENOUS
  Administered 2013-12-10 (×2): 50 ug via INTRAVENOUS

## 2013-12-10 MED ORDER — DEXTROSE 50 % IV SOLN
25.0000 g | Freq: Once | INTRAVENOUS | Status: AC
  Administered 2013-12-10: 25 g via INTRAVENOUS
  Filled 2013-12-10: qty 50

## 2013-12-10 MED ORDER — MEPERIDINE HCL 25 MG/ML IJ SOLN: 6.2500 mg | INTRAMUSCULAR | Status: DC | PRN

## 2013-12-10 MED ORDER — VANCOMYCIN HCL 1000 MG IV SOLR
1000.0000 mg | INTRAVENOUS | Status: DC | PRN
  Administered 2013-12-10: 1000 mg via INTRAVENOUS

## 2013-12-10 MED ORDER — SODIUM CHLORIDE 0.9 % IV SOLN
1.0000 g | Freq: Once | INTRAVENOUS | Status: AC
  Administered 2013-12-10: 1 g via INTRAVENOUS
  Filled 2013-12-10 (×2): qty 10

## 2013-12-10 MED ORDER — DEXTROSE 50 % IV SOLN
INTRAVENOUS | Status: AC
  Filled 2013-12-10: qty 50

## 2013-12-10 MED ORDER — IOHEXOL 300 MG/ML  SOLN
INTRAMUSCULAR | Status: DC | PRN
  Administered 2013-12-10: 20 mL via INTRAVENOUS

## 2013-12-10 MED ORDER — DEXTROSE 50 % IV SOLN
INTRAVENOUS | Status: DC | PRN
  Administered 2013-12-10: 12.5 g via INTRAVENOUS

## 2013-12-10 MED ORDER — VANCOMYCIN HCL IN DEXTROSE 1-5 GM/200ML-% IV SOLN
INTRAVENOUS | Status: AC
  Filled 2013-12-10: qty 200

## 2013-12-10 MED ORDER — PROPOFOL 10 MG/ML IV BOLUS
INTRAVENOUS | Status: DC | PRN
  Administered 2013-12-10: 100 mg via INTRAVENOUS

## 2013-12-10 MED ORDER — DEXAMETHASONE SODIUM PHOSPHATE 4 MG/ML IJ SOLN
INTRAMUSCULAR | Status: DC | PRN
  Administered 2013-12-10: 6 mg via INTRAVENOUS

## 2013-12-10 MED ORDER — SODIUM CHLORIDE 0.9 % IV SOLN
10.0000 mg | INTRAVENOUS | Status: DC | PRN
  Administered 2013-12-10: 10 ug/min via INTRAVENOUS

## 2013-12-10 MED ORDER — IPRATROPIUM BROMIDE 0.02 % IN SOLN
0.5000 mg | Freq: Once | RESPIRATORY_TRACT | Status: AC
  Administered 2013-12-10: 0.5 mg via RESPIRATORY_TRACT
  Filled 2013-12-10: qty 2.5

## 2013-12-10 MED ORDER — SODIUM CHLORIDE 0.9 % IR SOLN
Status: DC | PRN
  Administered 2013-12-10: 22:00:00

## 2013-12-10 MED ORDER — VECURONIUM BROMIDE 10 MG IV SOLR
INTRAVENOUS | Status: DC | PRN
  Administered 2013-12-10: 6 mg via INTRAVENOUS

## 2013-12-10 MED ORDER — INSULIN ASPART 100 UNIT/ML ~~LOC~~ SOLN
6.0000 [IU] | Freq: Once | SUBCUTANEOUS | Status: AC
  Administered 2013-12-10: 6 [IU] via INTRAVENOUS
  Filled 2013-12-10: qty 1

## 2013-12-10 MED ORDER — VANCOMYCIN HCL 1000 MG IV SOLR: 1000.0000 mg | INTRAVENOUS | Status: DC | PRN

## 2013-12-10 MED ORDER — SODIUM CHLORIDE 0.9 % IV SOLN
INTRAVENOUS | Status: DC
  Administered 2013-12-10: 20:00:00 via INTRAVENOUS
  Administered 2013-12-10: 10 mL/h via INTRAVENOUS

## 2013-12-10 MED ORDER — HEPARIN SODIUM (PORCINE) 1000 UNIT/ML IJ SOLN
INTRAMUSCULAR | Status: DC | PRN
  Administered 2013-12-10: 7000 [IU] via INTRAVENOUS

## 2013-12-10 MED ORDER — ONDANSETRON HCL 4 MG/2ML IJ SOLN
INTRAMUSCULAR | Status: DC | PRN
  Administered 2013-12-10: 4 mg via INTRAVENOUS

## 2013-12-10 MED ORDER — OXYCODONE HCL 5 MG PO TABS: 5.0000 mg | ORAL_TABLET | Freq: Once | ORAL | Status: AC | PRN

## 2013-12-10 MED ORDER — DEXTROSE 5 % IV SOLN
INTRAVENOUS | Status: DC | PRN
Start: 2013-12-10 — End: 2013-12-10
  Administered 2013-12-10: 21:00:00 via INTRAVENOUS

## 2013-12-10 MED ORDER — MIDAZOLAM HCL 5 MG/5ML IJ SOLN
INTRAMUSCULAR | Status: DC | PRN
  Administered 2013-12-10: 2 mg via INTRAVENOUS

## 2013-12-10 MED ORDER — SODIUM BICARBONATE 8.4 % IV SOLN
50.0000 meq | Freq: Once | INTRAVENOUS | Status: AC
  Administered 2013-12-10: 50 meq via INTRAVENOUS
  Filled 2013-12-10: qty 50

## 2013-12-10 MED ORDER — MIDAZOLAM HCL 2 MG/2ML IJ SOLN
INTRAMUSCULAR | Status: AC
  Filled 2013-12-10: qty 2

## 2013-12-10 MED ORDER — LIDOCAINE HCL (CARDIAC) 20 MG/ML IV SOLN
INTRAVENOUS | Status: DC | PRN
  Administered 2013-12-10: 60 mg via INTRAVENOUS

## 2013-12-10 MED ORDER — FENTANYL CITRATE 0.05 MG/ML IJ SOLN
INTRAMUSCULAR | Status: AC
  Filled 2013-12-10: qty 5

## 2013-12-10 MED ORDER — ARTIFICIAL TEARS OP OINT
TOPICAL_OINTMENT | OPHTHALMIC | Status: DC | PRN
  Administered 2013-12-10: 1 via OPHTHALMIC

## 2013-12-10 MED ORDER — ALBUTEROL SULFATE (2.5 MG/3ML) 0.083% IN NEBU
5.0000 mg | INHALATION_SOLUTION | Freq: Once | RESPIRATORY_TRACT | Status: AC
Start: 2013-12-10 — End: 2013-12-10
  Administered 2013-12-10: 5 mg via RESPIRATORY_TRACT
  Filled 2013-12-10: qty 6

## 2013-12-10 MED ORDER — ONDANSETRON HCL 4 MG/2ML IJ SOLN: 4.0000 mg | Freq: Once | INTRAMUSCULAR | Status: AC | PRN

## 2013-12-10 SURGICAL SUPPLY — 67 items
BANDAGE ELASTIC 4 VELCRO ST LF (GAUZE/BANDAGES/DRESSINGS) ×10 IMPLANT
BANDAGE ESMARK 6X9 LF (GAUZE/BANDAGES/DRESSINGS) IMPLANT
BENZOIN TINCTURE PRP APPL 2/3 (GAUZE/BANDAGES/DRESSINGS) ×5 IMPLANT
BLADE 10 SAFETY STRL DISP (BLADE) ×5 IMPLANT
BNDG ESMARK 6X9 LF (GAUZE/BANDAGES/DRESSINGS)
BNDG GAUZE ELAST 4 BULKY (GAUZE/BANDAGES/DRESSINGS) ×10 IMPLANT
CANISTER SUCTION 2500CC (MISCELLANEOUS) ×5 IMPLANT
CANNULA VESSEL 3MM 2 BLNT TIP (CANNULA) ×5 IMPLANT
CATH FOLEY 2WAY 5CC 16FR (CATHETERS) ×2
CATH URTH STD 16FR FL 2W DRN (CATHETERS) ×3 IMPLANT
CLIP LIGATING EXTRA MED SLVR (CLIP) ×5 IMPLANT
CLIP LIGATING EXTRA SM BLUE (MISCELLANEOUS) ×5 IMPLANT
CLOSURE STERI-STRIP 1/2X4 (GAUZE/BANDAGES/DRESSINGS) ×1
CLOSURE WOUND 1/2 X4 (GAUZE/BANDAGES/DRESSINGS) ×1
CLSR STERI-STRIP ANTIMIC 1/2X4 (GAUZE/BANDAGES/DRESSINGS) ×4 IMPLANT
COVER SURGICAL LIGHT HANDLE (MISCELLANEOUS) ×5 IMPLANT
CUFF TOURNIQUET SINGLE 34IN LL (TOURNIQUET CUFF) IMPLANT
CUFF TOURNIQUET SINGLE 44IN (TOURNIQUET CUFF) IMPLANT
DRAIN SNY 10X20 3/4 PERF (WOUND CARE) IMPLANT
DRAPE WARM FLUID 44X44 (DRAPE) ×5 IMPLANT
DRAPE X-RAY CASS 24X20 (DRAPES) ×5 IMPLANT
DRSG COVADERM 4X10 (GAUZE/BANDAGES/DRESSINGS) IMPLANT
DRSG COVADERM 4X6 (GAUZE/BANDAGES/DRESSINGS) ×5 IMPLANT
DRSG COVADERM 4X8 (GAUZE/BANDAGES/DRESSINGS) IMPLANT
ELECT REM PT RETURN 9FT ADLT (ELECTROSURGICAL) ×5
ELECTRODE REM PT RTRN 9FT ADLT (ELECTROSURGICAL) ×3 IMPLANT
EVACUATOR SILICONE 100CC (DRAIN) IMPLANT
GLOVE BIO SURGEON STRL SZ 6.5 (GLOVE) ×4 IMPLANT
GLOVE BIO SURGEONS STRL SZ 6.5 (GLOVE) ×1
GLOVE BIOGEL PI IND STRL 6.5 (GLOVE) ×9 IMPLANT
GLOVE BIOGEL PI IND STRL 7.5 (GLOVE) ×6 IMPLANT
GLOVE BIOGEL PI INDICATOR 6.5 (GLOVE) ×6
GLOVE BIOGEL PI INDICATOR 7.5 (GLOVE) ×4
GLOVE SS BIOGEL STRL SZ 7.5 (GLOVE) ×3 IMPLANT
GLOVE SUPERSENSE BIOGEL SZ 7.5 (GLOVE) ×2
GLOVE SURG SS PI 7.5 STRL IVOR (GLOVE) ×10 IMPLANT
GOWN STRL REUS W/ TWL LRG LVL3 (GOWN DISPOSABLE) ×12 IMPLANT
GOWN STRL REUS W/TWL LRG LVL3 (GOWN DISPOSABLE) ×8
INSERT FOGARTY SM (MISCELLANEOUS) IMPLANT
KIT BASIN OR (CUSTOM PROCEDURE TRAY) ×5 IMPLANT
KIT ROOM TURNOVER OR (KITS) ×5 IMPLANT
NS IRRIG 1000ML POUR BTL (IV SOLUTION) ×10 IMPLANT
PACK PERIPHERAL VASCULAR (CUSTOM PROCEDURE TRAY) ×5 IMPLANT
PAD ARMBOARD 7.5X6 YLW CONV (MISCELLANEOUS) ×10 IMPLANT
PADDING CAST COTTON 6X4 STRL (CAST SUPPLIES) IMPLANT
PROBE PENCIL 8 MHZ STRL DISP (MISCELLANEOUS) ×5 IMPLANT
SET COLLECT BLD 21X3/4 12 (NEEDLE) ×5 IMPLANT
SPONGE GAUZE 4X4 12PLY (GAUZE/BANDAGES/DRESSINGS) ×5 IMPLANT
SPONGE GAUZE 4X4 12PLY STER LF (GAUZE/BANDAGES/DRESSINGS) ×10 IMPLANT
STAPLER VISISTAT 35W (STAPLE) IMPLANT
STOPCOCK 4 WAY LG BORE MALE ST (IV SETS) ×5 IMPLANT
STRIP CLOSURE SKIN 1/2X4 (GAUZE/BANDAGES/DRESSINGS) ×4 IMPLANT
SURGILUBE 2OZ TUBE FLIPTOP (MISCELLANEOUS) ×5 IMPLANT
SUT ETHILON 3 0 PS 1 (SUTURE) IMPLANT
SUT PROLENE 5 0 C 1 24 (SUTURE) ×5 IMPLANT
SUT PROLENE 6 0 CC (SUTURE) ×5 IMPLANT
SUT SILK 2 0 SH (SUTURE) ×5 IMPLANT
SUT VIC AB 2-0 CTX 36 (SUTURE) ×10 IMPLANT
SUT VIC AB 3-0 SH 27 (SUTURE) ×4
SUT VIC AB 3-0 SH 27X BRD (SUTURE) ×6 IMPLANT
TAPE CLOTH SURG 4X10 WHT LF (GAUZE/BANDAGES/DRESSINGS) ×10 IMPLANT
TOWEL OR 17X24 6PK STRL BLUE (TOWEL DISPOSABLE) ×10 IMPLANT
TOWEL OR 17X26 10 PK STRL BLUE (TOWEL DISPOSABLE) ×10 IMPLANT
TRAY FOLEY CATH 16FRSI W/METER (SET/KITS/TRAYS/PACK) ×5 IMPLANT
TUBING EXTENTION W/L.L. (IV SETS) ×5 IMPLANT
UNDERPAD 30X30 INCONTINENT (UNDERPADS AND DIAPERS) ×5 IMPLANT
WATER STERILE IRR 1000ML POUR (IV SOLUTION) ×5 IMPLANT

## 2013-12-10 NOTE — H&P (Signed)
Patient name: [name] MRN: [id number] DOB: [birth year] Sex: male   Referred by: Ashok Cordia  Travis for referral:  Chief Complaint  Patient presents with  . Shortness of Breath    HISTORY OF PRESENT ILLNESS: The patient presents emergency room this evening with major complaint of shortness of breath. During evaluation of this patient reported that he had pain and numbness in his right foot as well. On further questioning the patient to stop have ischemia and I was consult and for further evaluation. In discussing with the patient he reports a chronic mild numbness in both legs and reports no difficulty as far as walking dose. He reports that 3 or 4 days ago he noted that he had pain and numbness specifically in his right leg from his knee distally. He has no sensation from his knee distally and has not had any for several days. He has not been able to put any weight on his right foot for several days. He has no prior history of lower extremity arterial insufficiency. Does have a history of portal vein thrombosis. Also has a history of chronic atrial fibrillation and has been prescribed Coumadin. The patient admits he has not been taking his Coumadin anticoagulation. No prior history of embolic event. He is known to have a markedly diminished ejection fraction of 25-30% on echocardiogram July 2014. Does have a history of chronic mild renal insufficiency  Past Medical History  Diagnosis Date  . Hypertension   . Chronic systolic heart failure     a. NICM - EF 35% with normal cors 2003. b. Last echo 11/2012 - EF 25-30%.  . Depression   . GERD (gastroesophageal reflux disease)   . Portal vein thrombosis   . Avascular necrosis     right hip s/p replacement  . Gastritis   . Alcohol abuse   . Erectile dysfunction   . Bipolar disorder   . Hydrocele, unspecified   . Intermittent claudication   . Lung nodule     Chest CT scan on 12/14/2008 showed a nodular opacity at the left lung base  felt to most likely represent scarring.  Follow-up chest CT scan on 06/02/2010 showed parenchymal scarring in the left apex, left  lower lobe, and lingula; some of this scarring at the left base had a nodular appearance, unchanged. Chest 09/2012 - stable.  . 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  Thyroid scan 08/28/2009 was normal with no focal areas of abnormal increased or decreased activity seen; the uptake of I 131 sodium iodide at 24 hours was 5.7%.  TSH and free T4 normalized by 08/16/2009.  . Intestinal obstruction   . COPD (chronic obstructive pulmonary disease)   . Clostridium difficile colitis   . E coli bacteremia   . DVT (deep venous thrombosis)     He has hypercoagulability with multiple prior DVTs  . Pleural plaque     H/o asbestos exposure. Chest CT on 06/02/2010 showed stable extensive calcified pleural plaques involving the left hemithorax, consistent with asbestos related pleural disease.  . Weight loss     Normal colonoscopy by Dr. Olevia Perches on 11/06/2010.  . Tachycardia-bradycardia syndrome     a. 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 b. St. Jude gen change 2010.  [name] PAF (paroxysmal atrial fibrillation)     Paroxysmal, failed medical therapy with amiodarone but has done very well with multaq;  d/c 2/2 to recurrent AFib;  Tikosyn load c/b Hyperkalemia during  admx 05/2013 => rate control strategy  . 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  . Noncompliance   . Atrial flutter   . Thrombocytopenia   . Hyperkalemia     type 4 RTA  . Pacemaker   . Shortness of breath     "all the time now" (07/02/2013)  . On home oxygen therapy     "2L prn" (07/02/2013)  . Pneumonia   . Cluster headaches     "~ twice/wk" (07/02/2013)  . Osteoarthritis   . Spondylosis   . Arthritis     "hips and legs" (07/02/2013)  . Chronic back pain     "all over my  back" (07/02/2013)  . Anxiety   . CKD (chronic kidney disease)     Renal U/S 12/04/2009 showed no pathological findings. Labs 12/04/2009 include normal ESR, C3, C4; neg ANA; SPEP showed nonspecific increase in the alpha-2 region with no M-spike; UPEP showed no monoclonal free light chains; urine IFE showed polyclonal increase in feree Kappa and/or free Lambda light chains. Baseline Cr reported 1.7-2.5.    Past Surgical History  Procedure Laterality Date  . Hemiarthroplasty hip Left 09/09/2002    bipolar; Dr. [place] Dragon  . Pacemaker insertion  02/2003    Dr. Roe Rutherford, [place] pulse generator identity SR model [id number] serial [id number]; gen change by Greggory Brandy  . Total hip arthroplasty Right 03/08/2008     By Dr. Hiram Comber  . Tee without cardioversion N/A 05/27/2013    Procedure: TRANSESOPHAGEAL ECHOCARDIOGRAM (TEE);  Surgeon: Candee Furbish, MD;  Location: [facility] ENDOSCOPY;  Service: Cardiovascular;  Laterality: N/A;  . Cardioversion N/A 05/27/2013    Procedure: CARDIOVERSION;  Surgeon: Candee Furbish, MD;  Location: [facility] ENDOSCOPY;  Service: Cardiovascular;  Laterality: N/A;  . Cardiac catheterization  2003    Nl cors, EF 35%  . Pacemaker placement  06/17/2008    Lead and generator change w/ St. Jude Medical Tendril ST model [id number] (serial  [id number]).       . Insert / replace / remove pacemaker      History   Social History  . Marital Status: Widowed    Spouse Name: N/A    Number of Children: 10  . Years of Education: N/A   Occupational History  . RETIRED    Social History Main Topics  . Smoking status: Former Smoker -- 1.50 packs/day for 54 years    Types: Cigarettes    Quit date: 06/08/2013  . Smokeless tobacco: Never Used  . Alcohol Use: Yes     Comment: 07/02/2013 "h/o etoh abuse; stopped drinking ~ 2012"  . Drug Use: 1.00 per week    Special: Marijuana     Comment: 07/02/2013 "stopped marijuana ~ 1 1/2 months ago"  . Sexual Activity: Not Currently   Other Topics Concern  .  Not on file   Social History Narrative   Works as a Architect part time   Smoker 1 pack last 1.5 days tobacco, smokes marijuana    Denies EtOH x 4 years (on 10/12/12)   12 kids    From [place]   [place] Veteran    12 grade education              Family History  Problem Relation Age of Onset  . Hypertension Mother   . Cancer Brother     Unsure of type.  . Asthma  Mother   . Coronary artery disease Mother   . Colon cancer Neg Hx   . Lung cancer Neg Hx   . Prostate cancer Neg Hx     Allergies as of 12/10/2013 - Review Complete 12/10/2013  Allergen Reaction Noted  . Digoxin and related  05/17/2013  . Penicillins Rash     No current facility-administered medications on file prior to encounter.   Current Outpatient Prescriptions on File Prior to Encounter  Medication Sig Dispense Refill  . albuterol (PROVENTIL HFA;VENTOLIN HFA) 108 (90 BASE) MCG/ACT inhaler Inhale 2 puffs into the lungs every 6 (six) hours as needed for wheezing or shortness of breath.      [name] buPROPion (WELLBUTRIN) 75 MG tablet Take 1 tablet (75 mg total) by mouth 2 (two) times daily.  60 tablet  5  . metoprolol succinate (TOPROL-XL) 50 MG 24 hr tablet Take 1 tablet (50 mg total) by mouth 2 (two) times daily. Take with or immediately following a meal.  180 tablet  1  . omeprazole (PRILOSEC) 20 MG capsule Take 2 capsules (40 mg total) by mouth daily.  60 capsule  6  . oxyCODONE-acetaminophen (PERCOCET) 10-325 MG per tablet Take 1 tablet by mouth every 4 (four) hours as needed for pain. Do not take more than 5 doses in a 24 hour period.  140 tablet  0  . sodium bicarbonate 650 MG tablet Take 2 tablets (1,300 mg total) by mouth 2 (two) times daily.  120 tablet  5  . tiotropium (SPIRIVA) 18 MCG inhalation capsule Place 1 capsule (18 mcg total) into inhaler and inhale daily.  30 capsule  11     REVIEW OF SYSTEMS:   negative except for his extensive past history as outlined above PHYSICAL  EXAMINATION:  General: The patient is a well-nourished male, in no acute distress. Vital signs are BP 144/101  Pulse 35  Temp(Src) 97.9 F (36.6 C) (Oral)  Resp 24  Ht '6\' 3"'  (1.905 m)  Wt 172 lb (78.019 kg)  BMI 21.50 kg/m2  SpO2 98% Pulmonary: There is a good air exchange bilaterally  Abdomen: Soft and non-tender no masses noted Musculoskeletal: There are no major deformities.   Neurologic: No motor or sensory function in his right leg from his knee distally. Skin: There are no ulcer or rashes noted. Right foot is cool compared to the left with rubor present Psychiatric: The patient has normal affect. Cardiovascular: 2+ radial pulses bilaterally, palpable femoral pulses bilaterally. On left he has an easily palpable popliteal and dorsalis pedis pulse. On the right he has no popliteal or pedal pulses    Vascular Lab Studies:  Inaudible flow in his right foot  Impression and Plan:  Profound ischemia of right lower extremity. This has been present for 3-4 days. He has no motor sensory function and does have rubor changes over his foot. Not particularly tender in his calf currently. I explained the urgency of this to the patient and his fianc present. I need to proceed immediately to the operating room for revascularization. Explained he may have a treatment with femoral thrombectomy only. Also explained this may require popliteal and tibial thrombectomy and also may require bypass. Explained he may need I debated contrast and this would be limited as much as possible with his known renal insufficiency. Creatinine is currently 1.7. This is his baseline. His INR is normal. Also explained that he may likely require fasciotomies do to his prolonged ischemia. We will proceed immediately to the operating  room tonight    Anshu Wehner [facility] Office: [phone]

## 2013-12-10 NOTE — Progress Notes (Addendum)
*  PRELIMINARY RESULTS* Vascular Ultrasound Right lower extremity arterial duplex has been completed.  Preliminary findings: Biphasic flow noted in right common femoral artery. 50-99% stenosis of the right profunda femoral artery. No flow noted in right femoral artery and no collaterals visualized. Minimal flow noted in right proximal popliteal artery, but not distally in popliteal, posterior tibial, or anterior tibial arteries.  Based on the lack of collateral flow and the echotexture inside the vessels, this appears to be arterial thrombus.  -Incidental finding- chronic calcified thrombus in CFV and FV.   Dr. Ashok Cordia was notified.   Landry Mellow, RDMS, RVT  12/10/2013, 6:15 PM

## 2013-12-10 NOTE — ED Provider Notes (Addendum)
CSN: [id number]     Arrival date & time 12/10/13  1617 History   First MD Initiated Contact with Patient 12/10/13 1637     Chief Complaint  Patient presents with  . Shortness of Breath     (Consider location/radiation/quality/duration/timing/severity/associated sxs/prior Treatment) Patient is a 68 y.o. male presenting with shortness of breath. The history is provided by the patient.  Shortness of Breath Associated symptoms: no abdominal pain, no chest pain, no cough, no fever, no headaches, no neck pain, no rash, no sore throat and no vomiting   pt with hx afib, chf, copd, c/o sob for the past week. Increased non productive cough. No chest pain or discomfort. No pleuritic pain. No increased leg edema, no orthopnea or pnd. Uses home o2 2-3 liters [place]. Uses mdi treatments prn. Denies recent steroid use. Pt denies sore throat, runny nose, or other uri c/o. No fever or chills. States compliant w normal meds. Former smoker, no recent smoking. Pt also c/o right lower leg and foot pain and numbness. Constant x 2 days. ?hx dvt ?hx pvd/claudication.      Past Medical History  Diagnosis Date  . Hypertension   . Chronic systolic heart failure     a. NICM - EF 35% with normal cors 2003. b. Last echo 11/2012 - EF 25-30%.  . Depression   . GERD (gastroesophageal reflux disease)   . Portal vein thrombosis   . Avascular necrosis     right hip s/p replacement  . Gastritis   . Alcohol abuse   . Erectile dysfunction   . Bipolar disorder   . Hydrocele, unspecified   . Intermittent claudication   . Lung nodule     Chest CT scan on 12/14/2008 showed a nodular opacity at the left lung base felt to most likely represent scarring.  Follow-up chest CT scan on 06/02/2010 showed parenchymal scarring in the left apex, left  lower lobe, and lingula; some of this scarring at the left base had a nodular appearance, unchanged. Chest 09/2012 - stable.  . Hyperthyroidism     Likely due to thyroiditis with possible  amiodarone association.  Thyroid scan 08/28/2009 was normal with no focal areas of abnormal increased or decreased activity seen; the uptake of I 131 sodium iodide at 24 hours was 5.7%.  TSH and free T4 normalized by 08/16/2009.  . Intestinal obstruction   . COPD (chronic obstructive pulmonary disease)   . Clostridium difficile colitis   . E coli bacteremia   . DVT (deep venous thrombosis)     He has hypercoagulability with multiple prior DVTs  . Pleural plaque     H/o asbestos exposure. Chest CT on 06/02/2010 showed stable extensive calcified pleural plaques involving the left hemithorax, consistent with asbestos related pleural disease.  . Weight loss     Normal colonoscopy by Dr. Olevia Perches on 11/06/2010.  . Tachycardia-bradycardia syndrome     a. 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 b. St. Jude gen change 2010.  [name] PAF (paroxysmal atrial fibrillation)     Paroxysmal, failed medical therapy with amiodarone but has done very well with multaq;  d/c 2/2 to recurrent AFib;  Tikosyn load c/b Hyperkalemia during admx 05/2013 => rate control strategy  . 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  . Noncompliance   . Atrial flutter   . Thrombocytopenia   . Hyperkalemia     type 4 RTA  .  Pacemaker   . Shortness of breath     "all the time now" (07/02/2013)  . On home oxygen therapy     "2L prn" (07/02/2013)  . Pneumonia   . Cluster headaches     "~ twice/wk" (07/02/2013)  . Osteoarthritis   . Spondylosis   . Arthritis     "hips and legs" (07/02/2013)  . Chronic back pain     "all over my back" (07/02/2013)  . Anxiety   . CKD (chronic kidney disease)     Renal U/S 12/04/2009 showed no pathological findings. Labs 12/04/2009 include normal ESR, C3, C4; neg ANA; SPEP showed nonspecific increase in the alpha-2 region with no M-spike; UPEP showed no monoclonal free light chains; urine IFE showed polyclonal  increase in feree Kappa and/or free Lambda light chains. Baseline Cr reported 1.7-2.5.   Past Surgical History  Procedure Laterality Date  . Hemiarthroplasty hip Left 09/09/2002    bipolar; Dr. Lafayette Dragon  . Pacemaker insertion  02/2003    Dr. Roe Rutherford, [place] pulse generator identity SR model [id number] serial [id number]; gen change by Greggory Brandy  . Total hip arthroplasty Right 03/08/2008     By Dr. Hiram Comber  . Tee without cardioversion N/A 05/27/2013    Procedure: TRANSESOPHAGEAL ECHOCARDIOGRAM (TEE);  Surgeon: Candee Furbish, MD;  Location: [facility] ENDOSCOPY;  Service: Cardiovascular;  Laterality: N/A;  . Cardioversion N/A 05/27/2013    Procedure: CARDIOVERSION;  Surgeon: Candee Furbish, MD;  Location: [facility] ENDOSCOPY;  Service: Cardiovascular;  Laterality: N/A;  . Cardiac catheterization  2003    Nl cors, EF 35%  . Pacemaker placement  06/17/2008    Lead and generator change w/ St. Jude Medical Tendril ST model [id number] (serial  [id number]).       . Insert / replace / remove pacemaker     Family History  Problem Relation Age of Onset  . Hypertension Mother   . Cancer Brother     Unsure of type.  . Asthma Mother   . Coronary artery disease Mother   . Colon cancer Neg Hx   . Lung cancer Neg Hx   . Prostate cancer Neg Hx    History  Substance Use Topics  . Smoking status: Former Smoker -- 1.50 packs/day for 54 years    Types: Cigarettes    Quit date: 06/08/2013  . Smokeless tobacco: Never Used  . Alcohol Use: Yes     Comment: 07/02/2013 "h/o etoh abuse; stopped drinking ~ 2012"    Review of Systems  Constitutional: Negative for fever and chills.  HENT: Negative for sore throat.   Eyes: Negative for redness.  Respiratory: Positive for shortness of breath. Negative for cough.   Cardiovascular: Negative for chest pain.  Gastrointestinal: Negative for vomiting, abdominal pain and diarrhea.  Genitourinary: Negative for flank pain.  Musculoskeletal: Negative for back pain and neck pain.   Skin: Negative for rash.  Neurological: Positive for numbness. Negative for headaches.  Hematological: Does not bruise/bleed easily.  Psychiatric/Behavioral: Negative for confusion.      Allergies  Digoxin and related and Penicillins  Home Medications   Prior to Admission medications   Medication Sig Start Date End Date Taking? Authorizing Provider  albuterol (PROVENTIL HFA;VENTOLIN HFA) 108 (90 BASE) MCG/ACT inhaler Inhale 2 puffs into the lungs every 6 (six) hours as needed for wheezing or shortness of breath. 04/22/12  Yes Axel Filler, MD  buPROPion (WELLBUTRIN) 75 MG tablet Take 1 tablet (75 mg total) by mouth 2 (two)  times daily. 06/15/13  Yes Axel Filler, MD  diltiazem (DILACOR XR) 240 MG 24 hr capsule Take 240 mg by mouth daily.   Yes Historical Provider, MD  metoprolol succinate (TOPROL-XL) 50 MG 24 hr tablet Take 1 tablet (50 mg total) by mouth 2 (two) times daily. Take with or immediately following a meal.   Yes Axel Filler, MD  omeprazole (PRILOSEC) 20 MG capsule Take 2 capsules (40 mg total) by mouth daily.   Yes Axel Filler, MD  oxyCODONE-acetaminophen (PERCOCET) 10-325 MG per tablet Take 1 tablet by mouth every 4 (four) hours as needed for pain. Do not take more than 5 doses in a 24 hour period. 11/25/13  Yes Axel Filler, MD  sodium bicarbonate 650 MG tablet Take 2 tablets (1,300 mg total) by mouth 2 (two) times daily. 06/12/13  Yes Liliane Shi, PA-C  tiotropium (SPIRIVA) 18 MCG inhalation capsule Place 1 capsule (18 mcg total) into inhaler and inhale daily. 05/19/13  Yes Juluis Mire, MD  warfarin (COUMADIN) 5 MG tablet Take 7.5-10 mg by mouth daily. Take 64m on Monday and Friday, 7.577mall other days   Yes Historical Provider, MD   BP 144/101  Pulse 35  Temp(Src) 97.9 F (36.6 C) (Oral)  Resp 24  Ht '6\' 3"'  (1.905 m)  Wt 172 lb (78.019 kg)  BMI 21.50 kg/m2  SpO2 98% Physical Exam  Nursing note and vitals reviewed. Constitutional: He  is oriented to person, place, and time. He appears well-developed and well-nourished. No distress.  HENT:  Mouth/Throat: Oropharynx is clear and moist.  Eyes: Conjunctivae are normal.  Neck: Neck supple. No tracheal deviation present.  Cardiovascular: Normal rate, regular rhythm, normal heart sounds and intact distal pulses.   Pulmonary/Chest: Effort normal and breath sounds normal. No accessory muscle usage. No respiratory distress.  Abdominal: Soft. Bowel sounds are normal. He exhibits no distension. There is no tenderness.  Genitourinary:  No cva tenderness  Musculoskeletal: Normal range of motion. He exhibits no edema and no tenderness.  Right foot cool to touch as compared w left. Unable to palpate right pop/dp/pt pulses. Right fem pulse palp. On left palp fem and pop pulse, unable to palp dp/pt.    Neurological: He is alert and oriented to person, place, and time.  Markedly decreased sensation RLE below knee, marked weakness plantar and dorsiflex at right ankle and great toe   Skin: Skin is warm and dry. No rash noted. He is not diaphoretic.  Psychiatric: He has a normal mood and affect.    ED Course  Procedures (including critical care time)  Results for orders placed during the hospital encounter of 12/10/13  MRSA PCR SCREENING      Result Value Ref Range   MRSA by PCR NEGATIVE  NEGATIVE  CBC      Result Value Ref Range   WBC 8.4  4.0 - 10.5 K/uL   RBC 5.29  4.22 - 5.81 MIL/uL   Hemoglobin 16.0  13.0 - 17.0 g/dL   HCT 48.8  39.0 - 52.0 %   MCV 92.2  78.0 - 100.0 fL   MCH 30.2  26.0 - 34.0 pg   MCHC 32.8  30.0 - 36.0 g/dL   RDW 14.9  11.5 - 15.5 %   Platelets 137 (*) 150 - 400 K/uL  PROTIME-INR      Result Value Ref Range   Prothrombin Time 13.0  11.6 - 15.2 seconds   INR 0.98  0.00 - 1.49  COMPREHENSIVE METABOLIC PANEL      Result Value Ref Range   Sodium 143  137 - 147 mEq/L   Potassium 7.0 (*) 3.7 - 5.3 mEq/L   Chloride 105  96 - 112 mEq/L   CO2 22  19 - 32  mEq/L   Glucose, Bld 86  70 - 99 mg/dL   BUN 23  6 - 23 mg/dL   Creatinine, Ser 1.73 (*) 0.50 - 1.35 mg/dL   Calcium 9.8  8.4 - 10.5 mg/dL   Total Protein 7.5  6.0 - 8.3 g/dL   Albumin 4.4  3.5 - 5.2 g/dL   AST 20  0 - 37 U/L   ALT 11  0 - 53 U/L   Alkaline Phosphatase 89  39 - 117 U/L   Total Bilirubin 1.1  0.3 - 1.2 mg/dL   GFR calc non Af Amer 39 (*) >90 mL/min   GFR calc Af Amer 45 (*) >90 mL/min   Anion gap 16 (*) 5 - 15  TROPONIN I      Result Value Ref Range   Troponin I <0.30  <0.30 ng/mL  PRO B NATRIURETIC PEPTIDE      Result Value Ref Range   Pro B Natriuretic peptide (BNP) 6010.0 (*) 0 - 125 pg/mL  POTASSIUM      Result Value Ref Range   Potassium 4.6  3.7 - 5.3 mEq/L  GLUCOSE, CAPILLARY      Result Value Ref Range   Glucose-Capillary 74  70 - 99 mg/dL   Comment 1 Notify RN    CBC      Result Value Ref Range   WBC 9.9  4.0 - 10.5 K/uL   RBC 4.79  4.22 - 5.81 MIL/uL   Hemoglobin 14.0  13.0 - 17.0 g/dL   HCT 43.4  39.0 - 52.0 %   MCV 90.6  78.0 - 100.0 fL   MCH 29.2  26.0 - 34.0 pg   MCHC 32.3  30.0 - 36.0 g/dL   RDW 14.6  11.5 - 15.5 %   Platelets 140 (*) 150 - 400 K/uL  GLUCOSE, CAPILLARY      Result Value Ref Range   Glucose-Capillary 161 (*) 70 - 99 mg/dL  BASIC METABOLIC PANEL      Result Value Ref Range   Sodium 142  137 - 147 mEq/L   Potassium 4.7  3.7 - 5.3 mEq/L   Chloride 106  96 - 112 mEq/L   CO2 22  19 - 32 mEq/L   Glucose, Bld 151 (*) 70 - 99 mg/dL   BUN 22  6 - 23 mg/dL   Creatinine, Ser 1.61 (*) 0.50 - 1.35 mg/dL   Calcium 8.9  8.4 - 10.5 mg/dL   GFR calc non Af Amer 42 (*) >90 mL/min   GFR calc Af Amer 49 (*) >90 mL/min   Anion gap 14  5 - 15   Dg Chest 2 View  12/10/2013   CLINICAL DATA:  Chest pain.  Cough.  EXAM: CHEST  2 VIEW  COMPARISON:  Chest CT and chest x-ray 06/02/2013  FINDINGS: The heart is mildly enlarged but stable. The mediastinal and hilar contours are unchanged. Two right ventricular pacer wires are unchanged. Severe  chronic pleural and parenchymal scarring changes involving the left lung but no definite acute overlying pulmonary process.  IMPRESSION: Chronic lung changes without definite acute overlying pulmonary process.   Electronically Signed   By: Kalman Jewels M.D.   On: 12/10/2013  18:58       EKG Interpretation   Date/Time:  Friday December 10 2013 16:29:22 EDT Ventricular Rate:  88 PR Interval:    QRS Duration: 95 QT Interval:  381 QTC Calculation: 461 R Axis:   2 Text Interpretation:  Atrial fibrillation No significant change since last  tracing Confirmed by Ashok Cordia  MD, Lennette Bihari ([id number]) on 12/10/2013 4:42:34 PM      MDM   Iv ns. Labs.  Reviewed nursing notes and prior charts for additional history.   Unable to locate handheld bedside doppler.  No palp dp/pt pulses.  Will get rapid bedside study - vascular tech paged.   Also paged vascular surgeon.  Suspect progressive pvd, now w absent pulses and increased/rest pain RLE w paresthesias.   Pt also w non palp pulse LLE, although leg foot warm, no rest pain or paresthesias.  Vascular surgery paged re pulseless extremity.   Initial k returns high ?true vs hemolyzed sample.   hco3 iv. d50 iv, insulin iv, ca gluc iv. Repeat specimen sent to lab.  Vascular surgery taking pt emergently to OR re pulseless/cold leg.   CRITICAL CARE  RE vascular occlusion/pulseless extremity, hyperkalemia, renal insufficiency/AKI Performed by: Mirna Mires Total critical care time: 35 Critical care time was exclusive of separately billable procedures and treating other patients. Critical care was necessary to treat or prevent imminent or life-threatening deterioration. Critical care was time spent personally by me on the following activities: development of treatment plan with patient and/or surrogate as well as nursing, discussions with consultants, evaluation of patient's response to treatment, examination of patient, obtaining history from patient or  surrogate, ordering and performing treatments and interventions, ordering and review of laboratory studies, ordering and review of radiographic studies, pulse oximetry and re-evaluation of patient's condition.       Mirna Mires, MD 12/13/13 [phone]

## 2013-12-10 NOTE — Op Note (Signed)
OPERATIVE REPORT  DATE OF SURGERY: 12/10/2013  PATIENT: [name], 68 y.o. male MRN: [id number]  DOB: 12-24-1945  PRE-OPERATIVE DIAGNOSIS: Severe ischemia right lower extremity  POST-OPERATIVE DIAGNOSIS:  Same  PROCEDURE: #1 right femoral embolectomy, #2 intraoperative arteriogram, a #3 medial and lateral lower extremity fasciotomies in the calf  SURGEON:  Curt Jews, M.D.  PHYSICIAN ASSISTANT: Nurse  ANESTHESIA:  Gen.  EBL: 100 ml  Total I/O In: 1300 [I.V.:1300] Out: 450 [Urine:350; Blood:100]  BLOOD ADMINISTERED: None  DRAINS: None  SPECIMEN: Right femoral embolus  COUNTS CORRECT:  YES  PLAN OF CARE: PACU stable   PATIENT DISPOSITION:  PACU - hemodynamically stable  PROCEDURE DETAILS: Patient is a 68 year old gentleman who presented to the  emergency room initially complaining of shortness of breath. He then reported he was having pain and numbness in his right foot. He was found to have profound ischemia with no motor or sensory function below his knee. Patient reports is been present for about 3 or 4 days. He is unable to stand on his right foot. He did have palpable pulses on the left foot and nothing from below the femoral level on the right. No audible Doppler flow. He was taken immediately to the operating room for revascularization. Explained to the patient with his prolonged ischemia but fasciotomies would be likely and also very high risk of permanent neurologic deficit.  The patient was placed in supine position the operating room and the right groin and right leg were prepped and draped in usual sterile fashion. Scissors made over the common femoral pulse and the incision was continued through the subcutaneous tissue with electrocautery down to the femoral artery. The common superficial femoral and profundus femoris arteries were encircled with vessel loops and had minimal atherosclerotic change. Patient was given 7000 units of intravenous heparin. After  circulation time the common superficial femoral and profundus wrist arteries were occluded. The common femoral artery was opened transversely above the bifurcation of the femoral artery. Clot was present in the common femoral artery and this was removed. A 4 Fogarty catheter was placed placed proximally through the external iliac above the level of the bifurcation. It no further clot was removed from the proximal artery. This was reoccluded. Next the 4 from the catheter was passed down the deep femoral artery and clot was removed there was excellent backbleeding and additional negative passes were achieved in the deep femoral artery was reoccluded. Finally the 4 Fogarty catheter was passed through the common femoral artery and superficial femoral artery to the level of the foot. Great deal of clot was removed with multiple passes. After 2 negative passes there was very good backbleeding. Intraoperative arteriogram was obtained from the knee distally. This showed normal artery at the popliteal with two-vessel runoff. There was some spasm of the arteries. No filling defects deficits were noted. The common femoral artery was closed with a running 6-0 Prolene suture. The usual flushing maneuvers were undertaken prior to closing this. Next incisions were made in the medial and lateral calf. A lateral incision was made at the level of the anterior and posterior lateral compartments. The anterior and lateral compartments were opened with fasciotomies extending from the knee to the ankle through the incision. The vein was controlled with electrocautery. The muscle that looked dusky and was quite cold. Next medial approach was made again opening the fascia from the level of the the ankle. The groin incision was irrigated with saline and hemostasis was obtained with  electrocautery. The groin incision was closed with several layers of 2-0 Vicryl in the subcutaneous tissue. The skin was closed with 3-0 subcuticular Vicryl  stitch. Steri-Strips were applied. The medial and lateral fashion incisions were packed with saline soaked Kerlix and this was covered with ABDs and Kerlix. The patient transferred to the recovery room in stable condition   Curt Jews, M.D. 12/10/2013 10:49 PM

## 2013-12-10 NOTE — ED Notes (Addendum)
Pt with sob x 2 days and chest pain with inspiration.  12 lead afib (pt with hx of afib).  Also c/o L thigh pain and R lower leg numbness.  PT stated improvement of sob after 5 mg albuterol nebx.

## 2013-12-10 NOTE — Anesthesia Preprocedure Evaluation (Signed)
Anesthesia Evaluation  Patient identified by MRN, date of birth, ID band Patient awake    Reviewed: Allergy & Precautions, H&P , NPO status , Patient's Chart, lab work & pertinent test results  Airway Mallampati: I TM Distance: >3 FB Neck ROM: Full    Dental   Pulmonary COPDformer smoker,          Cardiovascular hypertension, Pt. on medications + Peripheral Vascular Disease + pacemaker     Neuro/Psych    GI/Hepatic GERD-  Controlled,  Endo/Other  Hyperthyroidism   Renal/GU CRFRenal disease     Musculoskeletal   Abdominal   Peds  Hematology   Anesthesia Other Findings   Reproductive/Obstetrics                           Anesthesia Physical Anesthesia Plan  ASA: III and emergent  Anesthesia Plan: General   Post-op Pain Management:    Induction: Intravenous  Airway Management Planned: Oral ETT  Additional Equipment:   Intra-op Plan:   Post-operative Plan: Extubation in OR  Informed Consent: I have reviewed the patients History and Physical, chart, labs and discussed the procedure including the risks, benefits and alternatives for the proposed anesthesia with the patient or authorized representative who has indicated his/her understanding and acceptance.     Plan Discussed with: CRNA and Surgeon  Anesthesia Plan Comments:         Anesthesia Quick Evaluation

## 2013-12-10 NOTE — Transfer of Care (Signed)
Immediate Anesthesia Transfer of Care Note  Patient: [name]  Procedure(s) Performed: Procedure(s): Right Femoral Embolectomy; Fasciotomy Right Lower Leg with Intraoperative arteriogram (Right) INTRA OPERATIVE ARTERIOGRAM (Right)  Patient Location: PACU  Anesthesia Type:General  Level of Consciousness: oriented, sedated, patient cooperative and responds to stimulation  Airway & Oxygen Therapy: Patient Spontanous Breathing and Patient connected to face mask oxygen  Post-op Assessment: Report given to PACU RN, Post -op Vital signs reviewed and stable, Patient moving all extremities and Patient moving all extremities X 4  Post vital signs: Reviewed and stable  Complications: No apparent anesthesia complications

## 2013-12-10 NOTE — ED Notes (Signed)
ermd in to assess fro rle pulses with ultrasound

## 2013-12-10 NOTE — OR Nursing (Signed)
Foley insertion attempted by Elenor Legato RN intraoperatively; resistance met; Dr. Donnetta Hutching at bedside and notified.  Dr. Donnetta Hutching inserted 16 Fr. Coude cathter without difficulty.  Clear yellow urine returned.

## 2013-12-10 NOTE — Anesthesia Procedure Notes (Signed)
Procedure Name: Intubation Date/Time: 12/10/2013 8:56 PM Performed by: Jacquiline Doe A Pre-anesthesia Checklist: Patient identified, Timeout performed, Emergency Drugs available, Suction available and Patient being monitored Patient Re-evaluated:Patient Re-evaluated prior to inductionOxygen Delivery Method: Circle system utilized Preoxygenation: Pre-oxygenation with 100% oxygen Intubation Type: IV induction, Rapid sequence and Cricoid Pressure applied Grade View: Grade I Tube type: Oral Tube size: 8.0 mm Number of attempts: 1 Airway Equipment and Method: Stylet Placement Confirmation: ETT inserted through vocal cords under direct vision,  breath sounds checked- equal and bilateral and positive ETCO2 Secured at: 23 cm Tube secured with: Tape Dental Injury: Teeth and Oropharynx as per pre-operative assessment

## 2013-12-11 DIAGNOSIS — I743 Embolism and thrombosis of arteries of the lower extremities: Principal | ICD-10-CM

## 2013-12-11 DIAGNOSIS — I5042 Chronic combined systolic (congestive) and diastolic (congestive) heart failure: Secondary | ICD-10-CM

## 2013-12-11 DIAGNOSIS — I4891 Unspecified atrial fibrillation: Secondary | ICD-10-CM

## 2013-12-11 DIAGNOSIS — I10 Essential (primary) hypertension: Secondary | ICD-10-CM

## 2013-12-11 DIAGNOSIS — I509 Heart failure, unspecified: Secondary | ICD-10-CM

## 2013-12-11 DIAGNOSIS — I428 Other cardiomyopathies: Secondary | ICD-10-CM

## 2013-12-11 LAB — CBC
HCT: 43.4 % (ref 39.0–52.0)
HEMOGLOBIN: 14 g/dL (ref 13.0–17.0)
MCH: 29.2 pg (ref 26.0–34.0)
MCHC: 32.3 g/dL (ref 30.0–36.0)
MCV: 90.6 fL (ref 78.0–100.0)
PLATELETS: 140 10*3/uL — AB (ref 150–400)
RBC: 4.79 MIL/uL (ref 4.22–5.81)
RDW: 14.6 % (ref 11.5–15.5)
WBC: 9.9 10*3/uL (ref 4.0–10.5)

## 2013-12-11 LAB — MRSA PCR SCREENING: MRSA by PCR: NEGATIVE

## 2013-12-11 LAB — BASIC METABOLIC PANEL
ANION GAP: 14 (ref 5–15)
BUN: 22 mg/dL (ref 6–23)
CHLORIDE: 106 meq/L (ref 96–112)
CO2: 22 meq/L (ref 19–32)
CREATININE: 1.61 mg/dL — AB (ref 0.50–1.35)
Calcium: 8.9 mg/dL (ref 8.4–10.5)
GFR calc Af Amer: 49 mL/min — ABNORMAL LOW (ref >90)
GFR calc non Af Amer: 42 mL/min — ABNORMAL LOW (ref >90)
Glucose, Bld: 151 mg/dL — ABNORMAL HIGH (ref 70–99)
POTASSIUM: 4.7 meq/L (ref 3.7–5.3)
Sodium: 142 mEq/L (ref 137–147)

## 2013-12-11 LAB — HEPARIN LEVEL (UNFRACTIONATED): Heparin Unfractionated: 0.44 IU/mL (ref 0.30–0.70)

## 2013-12-11 LAB — GLUCOSE, CAPILLARY: GLUCOSE-CAPILLARY: 161 mg/dL — AB (ref 70–99)

## 2013-12-11 MED ORDER — OXYCODONE-ACETAMINOPHEN 5-325 MG PO TABS
1.0000 | ORAL_TABLET | ORAL | Status: DC | PRN
  Administered 2013-12-11 – 2013-12-20 (×36): 1 via ORAL
  Filled 2013-12-11 (×36): qty 1

## 2013-12-11 MED ORDER — KCL IN DEXTROSE-NACL 20-5-0.45 MEQ/L-%-% IV SOLN
INTRAVENOUS | Status: AC
  Filled 2013-12-11: qty 1000

## 2013-12-11 MED ORDER — OXYCODONE HCL 5 MG PO TABS
5.0000 mg | ORAL_TABLET | ORAL | Status: DC | PRN
  Administered 2013-12-11 – 2013-12-20 (×36): 5 mg via ORAL
  Filled 2013-12-11 (×36): qty 1

## 2013-12-11 MED ORDER — GUAIFENESIN-DM 100-10 MG/5ML PO SYRP: 15.0000 mL | ORAL_SOLUTION | ORAL | Status: DC | PRN

## 2013-12-11 MED ORDER — DEXTROSE 50 % IV SOLN
1.0000 | Freq: Once | INTRAVENOUS | Status: AC
  Administered 2013-12-11: 50 mL via INTRAVENOUS

## 2013-12-11 MED ORDER — VANCOMYCIN HCL IN DEXTROSE 1-5 GM/200ML-% IV SOLN
1000.0000 mg | Freq: Once | INTRAVENOUS | Status: AC
  Administered 2013-12-11: 1000 mg via INTRAVENOUS
  Filled 2013-12-11: qty 200

## 2013-12-11 MED ORDER — HEPARIN (PORCINE) IN NACL 100-0.45 UNIT/ML-% IJ SOLN
900.0000 [IU]/h | INTRAMUSCULAR | Status: DC
  Administered 2013-12-11: 1000 [IU]/h via INTRAVENOUS
  Administered 2013-12-12 – 2013-12-13 (×2): 900 [IU]/h via INTRAVENOUS
  Filled 2013-12-11 (×4): qty 250

## 2013-12-11 MED ORDER — PANTOPRAZOLE SODIUM 40 MG PO TBEC
40.0000 mg | DELAYED_RELEASE_TABLET | Freq: Every day | ORAL | Status: DC
  Administered 2013-12-11 – 2013-12-20 (×9): 40 mg via ORAL
  Filled 2013-12-11 (×6): qty 1

## 2013-12-11 MED ORDER — ACETAMINOPHEN 325 MG PO TABS: 325.0000 mg | ORAL_TABLET | ORAL | Status: DC | PRN

## 2013-12-11 MED ORDER — METOPROLOL SUCCINATE ER 50 MG PO TB24
50.0000 mg | ORAL_TABLET | Freq: Two times a day (BID) | ORAL | Status: DC
  Administered 2013-12-11 – 2013-12-17 (×13): 50 mg via ORAL
  Filled 2013-12-11 (×16): qty 1

## 2013-12-11 MED ORDER — DOCUSATE SODIUM 100 MG PO CAPS
100.0000 mg | ORAL_CAPSULE | Freq: Every day | ORAL | Status: DC
  Administered 2013-12-11 – 2013-12-20 (×9): 100 mg via ORAL
  Filled 2013-12-11 (×10): qty 1

## 2013-12-11 MED ORDER — WARFARIN SODIUM 10 MG PO TABS
10.0000 mg | ORAL_TABLET | Freq: Once | ORAL | Status: DC
  Filled 2013-12-11: qty 1

## 2013-12-11 MED ORDER — LABETALOL HCL 5 MG/ML IV SOLN
10.0000 mg | INTRAVENOUS | Status: DC | PRN
  Filled 2013-12-11 (×2): qty 4

## 2013-12-11 MED ORDER — HYDRALAZINE HCL 20 MG/ML IJ SOLN
10.0000 mg | INTRAMUSCULAR | Status: DC | PRN
  Filled 2013-12-11: qty 1

## 2013-12-11 MED ORDER — ACETAMINOPHEN 650 MG RE SUPP: 325.0000 mg | RECTAL | Status: DC | PRN

## 2013-12-11 MED ORDER — DILTIAZEM HCL ER 240 MG PO CP24
240.0000 mg | ORAL_CAPSULE | Freq: Every day | ORAL | Status: DC
  Administered 2013-12-11 – 2013-12-20 (×10): 240 mg via ORAL
  Filled 2013-12-11 (×11): qty 1

## 2013-12-11 MED ORDER — WARFARIN SODIUM 7.5 MG PO TABS
7.5000 mg | ORAL_TABLET | ORAL | Status: DC
  Administered 2013-12-11 – 2013-12-18 (×6): 7.5 mg via ORAL
  Filled 2013-12-11 (×7): qty 1

## 2013-12-11 MED ORDER — TIOTROPIUM BROMIDE MONOHYDRATE 18 MCG IN CAPS
18.0000 ug | ORAL_CAPSULE | Freq: Every day | RESPIRATORY_TRACT | Status: DC
  Administered 2013-12-11 – 2013-12-20 (×9): 18 ug via RESPIRATORY_TRACT
  Filled 2013-12-11 (×2): qty 5

## 2013-12-11 MED ORDER — DEXTROSE 50 % IV SOLN
INTRAVENOUS | Status: AC
  Filled 2013-12-11: qty 50

## 2013-12-11 MED ORDER — METOPROLOL TARTRATE 1 MG/ML IV SOLN
2.0000 mg | INTRAVENOUS | Status: DC | PRN
  Administered 2013-12-11: 5 mg via INTRAVENOUS
  Filled 2013-12-11: qty 5

## 2013-12-11 MED ORDER — OXYCODONE-ACETAMINOPHEN 10-325 MG PO TABS: 1.0000 | ORAL_TABLET | ORAL | Status: DC | PRN

## 2013-12-11 MED ORDER — BUPROPION HCL 75 MG PO TABS
75.0000 mg | ORAL_TABLET | Freq: Two times a day (BID) | ORAL | Status: DC
  Administered 2013-12-11 – 2013-12-20 (×19): 75 mg via ORAL
  Filled 2013-12-11 (×22): qty 1

## 2013-12-11 MED ORDER — PHENOL 1.4 % MT LIQD: 1.0000 | OROMUCOSAL | Status: DC | PRN

## 2013-12-11 MED ORDER — HEPARIN (PORCINE) IN NACL 100-0.45 UNIT/ML-% IJ SOLN
1000.0000 [IU]/h | INTRAMUSCULAR | Status: DC
  Filled 2013-12-11: qty 250

## 2013-12-11 MED ORDER — SODIUM BICARBONATE 650 MG PO TABS
1300.0000 mg | ORAL_TABLET | Freq: Two times a day (BID) | ORAL | Status: DC
  Administered 2013-12-11 – 2013-12-20 (×18): 1300 mg via ORAL
  Filled 2013-12-11 (×21): qty 2

## 2013-12-11 MED ORDER — WARFARIN SODIUM 10 MG PO TABS
10.0000 mg | ORAL_TABLET | ORAL | Status: DC
  Administered 2013-12-13 – 2013-12-17 (×2): 10 mg via ORAL
  Filled 2013-12-11 (×3): qty 1

## 2013-12-11 MED ORDER — ALUM & MAG HYDROXIDE-SIMETH 200-200-20 MG/5ML PO SUSP: 15.0000 mL | ORAL | Status: DC | PRN

## 2013-12-11 MED ORDER — ONDANSETRON HCL 4 MG/2ML IJ SOLN
4.0000 mg | Freq: Four times a day (QID) | INTRAMUSCULAR | Status: DC | PRN
  Filled 2013-12-11 (×2): qty 2

## 2013-12-11 MED ORDER — WARFARIN - PHYSICIAN DOSING INPATIENT
Freq: Every day | Status: DC
  Administered 2013-12-13: 17:00:00

## 2013-12-11 MED ORDER — MORPHINE SULFATE 2 MG/ML IJ SOLN
2.0000 mg | INTRAMUSCULAR | Status: DC | PRN
  Administered 2013-12-13: 3 mg via INTRAVENOUS
  Filled 2013-12-11 (×2): qty 2

## 2013-12-11 MED ORDER — WARFARIN SODIUM 7.5 MG PO TABS: 7.5000 mg | ORAL_TABLET | Freq: Every day | ORAL | Status: DC

## 2013-12-11 MED ORDER — POTASSIUM CHLORIDE CRYS ER 20 MEQ PO TBCR: 20.0000 meq | EXTENDED_RELEASE_TABLET | Freq: Once | ORAL | Status: AC | PRN

## 2013-12-11 MED ORDER — ALBUTEROL SULFATE (2.5 MG/3ML) 0.083% IN NEBU: 2.5000 mg | INHALATION_SOLUTION | Freq: Four times a day (QID) | RESPIRATORY_TRACT | Status: DC | PRN

## 2013-12-11 NOTE — Progress Notes (Addendum)
Pt transferred from PACU with RN, on monitor. Pt awake, oriented and able to move all ext. Pt right leg draining bloody drainage that saturated gauze, gauze wrap dressing and bed pad. Pt SBP 88-100, HR 80-90 afib. RN unable to doppler DP. MD Early made aware of these changes. MD Early is comfortable with pt SBP 80-90. Will d/c heparin that was scheduled for 0400, collect am labs, reinforce dressing with gauze wrap and an ace bandage wrapped loosely. Will continue to monitor.

## 2013-12-11 NOTE — Progress Notes (Signed)
Pharmacy: re- Heparin  Patient is a 68 y.o M s/p right femoral embolectomy currently on heparin for afib while INR is sub-therapeutic. Heparin level now back at goal with 0.44 (goal 0.3-0.7).  No bleeding documented.    Plan: 1) continue heparin drip at 1000 units/hr 2) f/u with AM labs  Dia Sitter, PharmD, BCPS

## 2013-12-11 NOTE — Progress Notes (Signed)
ANTICOAGULATION CONSULT NOTE - Initial Consult  Pharmacy Consult for heparin Indication: s/p right femoral embolectomy and afib  Allergies  Allergen Reactions  . Digoxin And Related     Dig toxicity 2009  . Penicillins Rash    Patient Measurements: Height: 6' 3" (190.5 cm) Weight: 175 lb 0.7 oz (79.4 kg) IBW/kg (Calculated) : 84.5 Heparin Dosing Weight: 79.4 kg  Vital Signs: Temp: 98.2 F (36.8 C) (07/25 0838) Temp src: Oral (07/25 0838) BP: 125/104 mmHg (07/25 0600) Pulse Rate: 84 (07/25 0600)  Labs:  Recent Labs  12/10/13 1810 12/11/13 0243 12/11/13 0500  HGB 16.0  --  14.0  HCT 48.8  --  43.4  PLT 137*  --  140*  LABPROT 13.0  --   --   INR 0.98  --   --   CREATININE 1.73* 1.61*  --   TROPONINI <0.30  --   --     Estimated Creatinine Clearance: 48.6 ml/min (by C-G formula based on Cr of 1.61).   Medical History: Past Medical History  Diagnosis Date  . Hypertension   . Chronic systolic heart failure     a. NICM - EF 35% with normal cors 2003. b. Last echo 11/2012 - EF 25-30%.  . Depression   . GERD (gastroesophageal reflux disease)   . Portal vein thrombosis   . Avascular necrosis     right hip s/p replacement  . Gastritis   . Alcohol abuse   . Erectile dysfunction   . Bipolar disorder   . Hydrocele, unspecified   . Intermittent claudication   . Lung nodule     Chest CT scan on 12/14/2008 showed a nodular opacity at the left lung base felt to most likely represent scarring.  Follow-up chest CT scan on 06/02/2010 showed parenchymal scarring in the left apex, left  lower lobe, and lingula; some of this scarring at the left base had a nodular appearance, unchanged. Chest 09/2012 - stable.  . 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  Thyroid scan 08/28/2009 was normal with no focal areas of abnormal increased or decreased activity seen; the uptake of I 131 sodium iodide at 24 hours was 5.7%.  TSH and free T4  normalized by 08/16/2009.  . Intestinal obstruction   . COPD (chronic obstructive pulmonary disease)   . Clostridium difficile colitis   . E coli bacteremia   . DVT (deep venous thrombosis)     He has hypercoagulability with multiple prior DVTs  . Pleural plaque     H/o asbestos exposure. Chest CT on 06/02/2010 showed stable extensive calcified pleural plaques involving the left hemithorax, consistent with asbestos related pleural disease.  . Weight loss     Normal colonoscopy by Dr. Olevia Perches on 11/06/2010.  . Tachycardia-bradycardia syndrome     a. 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 b. St. Jude gen change 2010.  [name] PAF (paroxysmal atrial fibrillation)     Paroxysmal, failed medical therapy with amiodarone but has done very well with multaq;  d/c 2/2 to recurrent AFib;  Tikosyn load c/b Hyperkalemia during admx 05/2013 => rate control strategy  . 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  . Noncompliance   . Atrial flutter   . Thrombocytopenia   . Hyperkalemia     type 4 RTA  . Pacemaker   . Shortness of breath     "all the  time now" (07/02/2013)  . On home oxygen therapy     "2L prn" (07/02/2013)  . Pneumonia   . Cluster headaches     "~ twice/wk" (07/02/2013)  . Osteoarthritis   . Spondylosis   . Arthritis     "hips and legs" (07/02/2013)  . Chronic back pain     "all over my back" (07/02/2013)  . Anxiety   . CKD (chronic kidney disease)     Renal U/S 12/04/2009 showed no pathological findings. Labs 12/04/2009 include normal ESR, C3, C4; neg ANA; SPEP showed nonspecific increase in the alpha-2 region with no M-spike; UPEP showed no monoclonal free light chains; urine IFE showed polyclonal increase in feree Kappa and/or free Lambda light chains. Baseline Cr reported 1.7-2.5.    Medications:  Scheduled:  . buPROPion  75 mg Oral BID WC  . dextrose 5 % and 0.45 % NaCl with KCl 20 mEq/L       . diltiazem  240 mg Oral Daily  . docusate sodium  100 mg Oral Daily  . metoprolol succinate  50 mg Oral BID  . pantoprazole  40 mg Oral Daily  . sodium bicarbonate  1,300 mg Oral BID  . tiotropium  18 mcg Inhalation Daily  . vancomycin  1,000 mg Intravenous Once  . warfarin  7.5 mg Oral Once per day on Sun Tue Wed Thu Sat   And  . [START ON 12/13/2013] warfarin  10 mg Oral Once per day on Mon Fri  . Warfarin - Physician Dosing Inpatient   Does not apply q1800   Infusions:  . dextrose 5 % and 0.45 % NaCl with KCl 20 mEq/L 75 mL/hr at 12/11/13 0005  . heparin      Assessment: 68 yo male with afib and s/p right femoral embolectomy will be restarted on heparin.  It was stopped earlier due to some right leg bleeding.  MD now wants to restart.  hgb 14 and Plt 140 K. Patient is also on coumadin; dosed by MD. Goal of Therapy:  Heparin level 0.3-0.7 units/ml Monitor platelets by anticoagulation protocol: Yes   Plan:  1) Start heparin at 1000 units/hr 2) 8hr heparin level 3) Monitor for bleeding closely.  , Tsz-Yin 12/11/2013,9:08 AM

## 2013-12-11 NOTE — Progress Notes (Signed)
Subjective: Interval History: none.. comfortable this morning. Awake and alert.  Objective: Vital signs in last 24 hours: Temp:  [97.4 F (36.3 C)-98.2 F (36.8 C)] 98.2 F (36.8 C) (07/25 0838) Pulse Rate:  [35-94] 84 (07/25 0600) Resp:  [12-29] 29 (07/25 0400) BP: (86-152)/(53-104) 125/104 mmHg (07/25 0600) SpO2:  [98 %-100 %] 100 % (07/25 0400) Weight:  [172 lb (78.019 kg)-175 lb 0.7 oz (79.4 kg)] 175 lb 0.7 oz (79.4 kg) (07/25 0024)  Intake/Output from previous day: 07/24 0701 - 07/25 0700 In: 1750 [I.V.:1750] Out: 700 [Urine:600; Blood:100] Intake/Output this shift:    The groin wound intact with no hematoma. 2+ right popliteal pulse. Biphasic to triphasic anterior tibial and posterior tibial Doppler flow in the foot. Fasciotomy dressings intact with some oozing through the night but none since 3 AM. No motor or sensory function in right foot  Lab Results:  Recent Labs  12/10/13 1810 12/11/13 0500  WBC 8.4 9.9  HGB 16.0 14.0  HCT 48.8 43.4  PLT 137* 140*   BMET  Recent Labs  12/10/13 1810 12/10/13 2311 12/11/13 0243  NA 143  --  142  K 7.0* 4.6 4.7  CL 105  --  106  CO2 22  --  22  GLUCOSE 86  --  151*  BUN 23  --  22  CREATININE 1.73*  --  1.61*  CALCIUM 9.8  --  8.9    Studies/Results: Dg Chest 2 View  12/10/2013   CLINICAL DATA:  Chest pain.  Cough.  EXAM: CHEST  2 VIEW  COMPARISON:  Chest CT and chest x-ray 06/02/2013  FINDINGS: The heart is mildly enlarged but stable. The mediastinal and hilar contours are unchanged. Two right ventricular pacer wires are unchanged. Severe chronic pleural and parenchymal scarring changes involving the left lung but no definite acute overlying pulmonary process.  IMPRESSION: Chronic lung changes without definite acute overlying pulmonary process.   Electronically Signed   By: Kalman Jewels M.D.   On: 12/10/2013 18:58   Anti-infectives: Anti-infectives   Start     Dose/Rate Route Frequency Ordered Stop   12/11/13  0800  vancomycin (VANCOCIN) IVPB 1000 mg/200 mL premix     1,000 mg 200 mL/hr over 60 Minutes Intravenous  Once 12/11/13 0050        Assessment/Plan: s/p Procedure(s): Right Femoral Embolectomy; Fasciotomy Right Lower Leg with Intraoperative arteriogram (Right) INTRA OPERATIVE ARTERIOGRAM (Right) Stable this morning. Normal blood flow to her right foot. Unfortunately minimal motor or sensory function related to prolonged ischemia. Discuss this at length with the patient. Explained we would begin mobilization and anticoagulation. I have consult to Dr. Martinique from cardiology for evaluation for possible embolic source and for rate control. Fortunately creatinine is stable at 1.6 this morning. Otherwise stable overall   LOS: 1 day   [name] 12/11/2013, 8:54 AM

## 2013-12-11 NOTE — Anesthesia Postprocedure Evaluation (Signed)
Anesthesia Post Note  Patient: [name]  Procedure(s) Performed: Procedure(s) (LRB): Right Femoral Embolectomy; Fasciotomy Right Lower Leg with Intraoperative arteriogram (Right) INTRA OPERATIVE ARTERIOGRAM (Right)  Anesthesia type: general  Patient location: PACU  Post pain: Pain level controlled  Post assessment: Patient's Cardiovascular Status Stable  Last Vitals:  Filed Vitals:   12/11/13 0106  BP: 90/72  Pulse: 63  Temp:   Resp:     Post vital signs: Reviewed and stable  Level of consciousness: sedated  Complications: No apparent anesthesia complications Post op CBG was 78 will give 1 amp D50 to ofset 6 units of insulin given in ER to treat K of 7.

## 2013-12-11 NOTE — Progress Notes (Signed)
PT Cancellation Note  Patient Details Name: [name] MRN: [id number] DOB: Nov 27, 1945   Cancelled Treatment:    Reason Eval/Treat Not Completed: Patient declined, no reason specified Pt refusing OOB or PT eval today.  PT to re-attempt eval tomorrow.   Lorriane Shire 12/11/2013, 1:36 PM  Lorrin Goodell, PT  Office # [phone] Pager [phone]

## 2013-12-11 NOTE — Progress Notes (Addendum)
ANTICOAGULATION CONSULT NOTE - Initial Consult  Pharmacy Consult for Heparin/Warfarin Indication: s/p right femoral embolectomy  Allergies  Allergen Reactions  . Digoxin And Related     Dig toxicity 2009  . Penicillins Rash    Patient Measurements: Height: 6' 3" (190.5 cm) Weight: 172 lb (78.019 kg) IBW/kg (Calculated) : 84.5  Vital Signs: Temp: 97.4 F (36.3 C) (07/24 2245) Temp src: Oral (07/24 1625) BP: 124/76 mmHg (07/24 2345) Pulse Rate: 83 (07/24 2345)  Labs:  Recent Labs  12/10/13 1810  HGB 16.0  HCT 48.8  PLT 137*  LABPROT 13.0  INR 0.98  CREATININE 1.73*  TROPONINI <0.30    Estimated Creatinine Clearance: 44.5 ml/min (by C-G formula based on Cr of 1.73).   Medical History: Past Medical History  Diagnosis Date  . Hypertension   . Chronic systolic heart failure     a. NICM - EF 35% with normal cors 2003. b. Last echo 11/2012 - EF 25-30%.  . Depression   . GERD (gastroesophageal reflux disease)   . Portal vein thrombosis   . Avascular necrosis     right hip s/p replacement  . Gastritis   . Alcohol abuse   . Erectile dysfunction   . Bipolar disorder   . Hydrocele, unspecified   . Intermittent claudication   . Lung nodule     Chest CT scan on 12/14/2008 showed a nodular opacity at the left lung base felt to most likely represent scarring.  Follow-up chest CT scan on 06/02/2010 showed parenchymal scarring in the left apex, left  lower lobe, and lingula; some of this scarring at the left base had a nodular appearance, unchanged. Chest 09/2012 - stable.  . 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  Thyroid scan 08/28/2009 was normal with no focal areas of abnormal increased or decreased activity seen; the uptake of I 131 sodium iodide at 24 hours was 5.7%.  TSH and free T4 normalized by 08/16/2009.  . Intestinal obstruction   . COPD (chronic obstructive pulmonary disease)   . Clostridium difficile colitis   . E  coli bacteremia   . DVT (deep venous thrombosis)     He has hypercoagulability with multiple prior DVTs  . Pleural plaque     H/o asbestos exposure. Chest CT on 06/02/2010 showed stable extensive calcified pleural plaques involving the left hemithorax, consistent with asbestos related pleural disease.  . Weight loss     Normal colonoscopy by Dr. Olevia Perches on 11/06/2010.  . Tachycardia-bradycardia syndrome     a. 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 b. St. Jude gen change 2010.  [name] PAF (paroxysmal atrial fibrillation)     Paroxysmal, failed medical therapy with amiodarone but has done very well with multaq;  d/c 2/2 to recurrent AFib;  Tikosyn load c/b Hyperkalemia during admx 05/2013 => rate control strategy  . 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  . Noncompliance   . Atrial flutter   . Thrombocytopenia   . Hyperkalemia     type 4 RTA  . Pacemaker   . Shortness of breath     "all the time now" (07/02/2013)  . On home oxygen therapy     "2L prn" (07/02/2013)  . Pneumonia   . Cluster headaches     "~ twice/wk" (07/02/2013)  . Osteoarthritis   . Spondylosis   . Arthritis     "hips  and legs" (07/02/2013)  . Chronic back pain     "all over my back" (07/02/2013)  . Anxiety   . CKD (chronic kidney disease)     Renal U/S 12/04/2009 showed no pathological findings. Labs 12/04/2009 include normal ESR, C3, C4; neg ANA; SPEP showed nonspecific increase in the alpha-2 region with no M-spike; UPEP showed no monoclonal free light chains; urine IFE showed polyclonal increase in feree Kappa and/or free Lambda light chains. Baseline Cr reported 1.7-2.5.    Assessment: 68 y/o M s/p right femoral embolectomy to start heparin at 0400 per MD request. CBC good, was supposed to be on warfarin PTA but wasn't taking and INR is normal--MD restarting warfarin as well. Mild renal dysfunction. Other labs as above.    Goal of Therapy:  Heparin level 0.3-0.7 units/ml Monitor platelets by anticoagulation protocol: Yes   Plan:  -Start heparin drip at 1000 units/hr at 0400 -1200 HL -Daily CBC/HL -Monitor for bleeding from surgical sites  -Warfarin re-started per MD  -Daily PT/INR -Monitor for bleeding  Narda Bonds 12/10/2013,11:58 PM  ========================= Also asked to dose vancomycin for surgical prophylaxis post-op -Will given vancomycin 1000 mg IV x 1 at 0800 this AM JLedford, PharmD

## 2013-12-11 NOTE — Consult Note (Signed)
CARDIOLOGY CONSULT NOTE     Patient ID: [name] MRN: [id number] DOB/AGE: 70-Jun-1947 70 y.o.  Admit date: 12/10/2013 Referring Physician Curt Jews MD Primary Physician Axel Filler, MD Primary Cardiologist Thompson Grayer MD Reason for Consultation Afib, LE embolus  HPI: 68 yo BM seen at the request of Dr. Donnetta Hutching for evaluation of Afib and lower extremity embolus. Patient has a history of permanent Afib and nonischemic cardiomyopathy. He has a history of multiple DVTs and portal vein thrombosis. He has been completely noncompliant with anticoagulation therapy. He presents with a right femoral embolus with cold leg associated with neuropathy and loss of function. He is s/p embolectomy and fasciotomy. He denies any chest pain or SOB. No dizziness or syncope.  Past Medical History  Diagnosis Date  . Hypertension   . Chronic systolic heart failure     a. NICM - EF 35% with normal cors 2003. b. Last echo 11/2012 - EF 25-30%.  . Depression   . GERD (gastroesophageal reflux disease)   . Portal vein thrombosis   . Avascular necrosis     right hip s/p replacement  . Gastritis   . Alcohol abuse   . Erectile dysfunction   . Bipolar disorder   . Hydrocele, unspecified   . Intermittent claudication   . Lung nodule     Chest CT scan on 12/14/2008 showed a nodular opacity at the left lung base felt to most likely represent scarring.  Follow-up chest CT scan on 06/02/2010 showed parenchymal scarring in the left apex, left  lower lobe, and lingula; some of this scarring at the left base had a nodular appearance, unchanged. Chest 09/2012 - stable.  . 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  Thyroid scan 08/28/2009 was normal with no focal areas of abnormal increased or decreased activity seen; the uptake of I 131 sodium iodide at 24 hours was 5.7%.  TSH and free T4 normalized by 08/16/2009.  . Intestinal obstruction   . COPD (chronic obstructive  pulmonary disease)   . Clostridium difficile colitis   . E coli bacteremia   . DVT (deep venous thrombosis)     He has hypercoagulability with multiple prior DVTs  . Pleural plaque     H/o asbestos exposure. Chest CT on 06/02/2010 showed stable extensive calcified pleural plaques involving the left hemithorax, consistent with asbestos related pleural disease.  . Weight loss     Normal colonoscopy by Dr. Olevia Perches on 11/06/2010.  . Tachycardia-bradycardia syndrome     a. 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 b. St. Jude gen change 2010.  [name] PAF (paroxysmal atrial fibrillation)     Paroxysmal, failed medical therapy with amiodarone but has done very well with multaq;  d/c 2/2 to recurrent AFib;  Tikosyn load c/b Hyperkalemia during admx 05/2013 => rate control strategy  . 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  . Noncompliance   . Atrial flutter   . Thrombocytopenia   . Hyperkalemia     type 4 RTA  . Pacemaker   . Shortness of breath     "all the time now" (07/02/2013)  . On home oxygen therapy     "2L prn" (07/02/2013)  . Pneumonia   . Cluster headaches     "~ twice/wk" (07/02/2013)  . Osteoarthritis   . Spondylosis   . Arthritis     "hips and legs" (07/02/2013)  .  Chronic back pain     "all over my back" (07/02/2013)  . Anxiety   . CKD (chronic kidney disease)     Renal U/S 12/04/2009 showed no pathological findings. Labs 12/04/2009 include normal ESR, C3, C4; neg ANA; SPEP showed nonspecific increase in the alpha-2 region with no M-spike; UPEP showed no monoclonal free light chains; urine IFE showed polyclonal increase in feree Kappa and/or free Lambda light chains. Baseline Cr reported 1.7-2.5.    Family History  Problem Relation Age of Onset  . Hypertension Mother   . Cancer Brother     Unsure of type.  . Asthma Mother   . Coronary artery disease Mother   . Colon cancer Neg Hx     . Lung cancer Neg Hx   . Prostate cancer Neg Hx     History   Social History  . Marital Status: Widowed    Spouse Name: N/A    Number of Children: 10  . Years of Education: N/A   Occupational History  . RETIRED    Social History Main Topics  . Smoking status: Former Smoker -- 1.50 packs/day for 54 years    Types: Cigarettes    Quit date: 06/08/2013  . Smokeless tobacco: Never Used  . Alcohol Use: Yes     Comment: 07/02/2013 "h/o etoh abuse; stopped drinking ~ 2012"  . Drug Use: 1.00 per week    Special: Marijuana     Comment: 07/02/2013 "stopped marijuana ~ 1 1/2 months ago"  . Sexual Activity: Not Currently   Other Topics Concern  . Not on file   Social History Narrative   Works as a Architect part time   Smoker 1 pack last 1.5 days tobacco, smokes marijuana    Denies EtOH x 4 years (on 10/12/12)   12 kids    From [place]   [place] Veteran    12 grade education              Past Surgical History  Procedure Laterality Date  . Hemiarthroplasty hip Left 09/09/2002    bipolar; Dr. Lafayette Dragon  . Pacemaker insertion  02/2003    Dr. Roe Rutherford, [place] pulse generator identity SR model [id number] serial [id number]; gen change by Greggory Brandy  . Total hip arthroplasty Right 03/08/2008     By Dr. Hiram Comber  . Tee without cardioversion N/A 05/27/2013    Procedure: TRANSESOPHAGEAL ECHOCARDIOGRAM (TEE);  Surgeon: Candee Furbish, MD;  Location: [facility] ENDOSCOPY;  Service: Cardiovascular;  Laterality: N/A;  . Cardioversion N/A 05/27/2013    Procedure: CARDIOVERSION;  Surgeon: Candee Furbish, MD;  Location: [facility] ENDOSCOPY;  Service: Cardiovascular;  Laterality: N/A;  . Cardiac catheterization  2003    Nl cors, EF 35%  . Pacemaker placement  06/17/2008    Lead and generator change w/ St. Jude Medical Tendril ST model [id number] (serial  [id number]).       . Insert / replace / remove pacemaker       Prescriptions prior to admission  Medication Sig Dispense Refill  . albuterol (PROVENTIL  HFA;VENTOLIN HFA) 108 (90 BASE) MCG/ACT inhaler Inhale 2 puffs into the lungs every 6 (six) hours as needed for wheezing or shortness of breath.      [name] buPROPion (WELLBUTRIN) 75 MG tablet Take 1 tablet (75 mg total) by mouth 2 (two) times daily.  60 tablet  5  . diltiazem (DILACOR XR) 240 MG 24 hr capsule Take 240 mg by mouth daily.      [name]  metoprolol succinate (TOPROL-XL) 50 MG 24 hr tablet Take 1 tablet (50 mg total) by mouth 2 (two) times daily. Take with or immediately following a meal.  180 tablet  1  . omeprazole (PRILOSEC) 20 MG capsule Take 2 capsules (40 mg total) by mouth daily.  60 capsule  6  . oxyCODONE-acetaminophen (PERCOCET) 10-325 MG per tablet Take 1 tablet by mouth every 4 (four) hours as needed for pain. Do not take more than 5 doses in a 24 hour period.  140 tablet  0  . sodium bicarbonate 650 MG tablet Take 2 tablets (1,300 mg total) by mouth 2 (two) times daily.  120 tablet  5  . tiotropium (SPIRIVA) 18 MCG inhalation capsule Place 1 capsule (18 mcg total) into inhaler and inhale daily.  30 capsule  11  . warfarin (COUMADIN) 5 MG tablet Take 7.5-10 mg by mouth daily. Take 5m on Monday and Friday, 7.544mall other days          ROS: As noted in HPI. All other systems are reviewed and are negative unless otherwise mentioned.   Physical Exam: Blood pressure 125/104, pulse 84, temperature 97.6 F (36.4 C), temperature source Oral, resp. rate 29, height 6' 3" (1.905 m), weight 175 lb 0.7 oz (79.4 kg), SpO2 100.00%. Current Weight  12/11/13 175 lb 0.7 oz (79.4 kg)  12/11/13 175 lb 0.7 oz (79.4 kg)  10/05/13 179 lb 8 oz (81.421 kg)    Elderly BM in NAD HEENT. [place]/AT. PERRL, EOMI. Oropharynx with missing dentition.  Neck supple. Good carotid upstrokes. No JVD Lungs. Clear CV IRR without gallop. Gr 1/6 systolic Murmur at apex. Pacer noted in right upper chest. Abd. Soft NT. BS + Ext. Fasciotomy scar right leg absent pedal pulse but foot is warm. No sensation right  foot. Neuro alert and oriented x 3. CN II-XII intact.   Labs:   Lab Results  Component Value Date   WBC 9.9 12/11/2013   HGB 14.0 12/11/2013   HCT 43.4 12/11/2013   MCV 90.6 12/11/2013   PLT 140* 12/11/2013    Recent Labs Lab 12/10/13 1810  12/11/13 0243  NA 143  --  142  K 7.0*  < > 4.7  CL 105  --  106  CO2 22  --  22  BUN 23  --  22  CREATININE 1.73*  --  1.61*  CALCIUM 9.8  --  8.9  PROT 7.5  --   --   BILITOT 1.1  --   --   ALKPHOS 89  --   --   ALT 11  --   --   AST 20  --   --   GLUCOSE 86  --  151*  < > = values in this interval not displayed. Lab Results  Component Value Date   CKTOTAL 86 07/04/2010   CKTOTAL 50 02/06/2010   CKTOTAL 55 02/06/2010   CKMB 1.5 07/04/2010   CKMB 1.1 02/06/2010   CKMB 1.7 02/06/2010   TROPONINI <0.30 12/10/2013   TROPONINI <0.30 07/04/2013   TROPONINI <0.30 07/03/2013    Lab Results  Component Value Date   CHOL 113 08/14/2011   CHOL  Value: 98        ATP III CLASSIFICATION:  <200     mg/dL   Desirable  200-239  mg/dL   Borderline High  >=240    mg/dL   High        02/06/2010   CHOL 98 02/06/2010  Lab Results  Component Value Date   HDL 45 08/14/2011   HDL 33* 02/06/2010   HDL 33 02/06/2010   Lab Results  Component Value Date   LDLCALC 59 08/14/2011   LDLCALC  Value: 55        Total Cholesterol/HDL:CHD Risk Coronary Heart Disease Risk Table                     Men   Women  1/2 Average Risk   3.4   3.3  Average Risk       5.0   4.4  2 X Average Risk   9.6   7.1  3 X Average Risk  23.4   11.0        Use the calculated Patient Ratio above and the CHD Risk Table to determine the patient's CHD Risk.        ATP III CLASSIFICATION (LDL):  <100     mg/dL   Optimal  100-129  mg/dL   Near or Above                    Optimal  130-159  mg/dL   Borderline  160-189  mg/dL   High  >190     mg/dL   Very High 02/06/2010   LDLCALC 55 02/06/2010   Lab Results  Component Value Date   TRIG 43 08/14/2011   TRIG 49 02/06/2010   TRIG 36 07/06/2009   Lab Results   Component Value Date   CHOLHDL 2.5 08/14/2011   CHOLHDL 3.0 02/06/2010   CHOLHDL 2.1 Ratio 07/06/2009   No results found for this basename: LDLDIRECT    Lab Results  Component Value Date   PROBNP 6010.0* 12/10/2013   PROBNP 3125.0* 06/02/2013   PROBNP 9662.0* 05/22/2013   Lab Results  Component Value Date   TSH 1.999 05/24/2013   Lab Results  Component Value Date   HGBA1C  Value: 5.6 (NOTE)                                                                       According to the ADA Clinical Practice Recommendations for 2011, when HbA1c is used as a screening test:   >=6.5%   Diagnostic of Diabetes Mellitus           (if abnormal result  is confirmed)  5.7-6.4%   Increased risk of developing Diabetes Mellitus  References:Diagnosis and Classification of Diabetes Mellitus,Diabetes QPYP,9509,32(IZTIW 1):S62-S69 and Standards of Medical Care in         Diabetes - 2011,Diabetes PYKD,9833,82  (Suppl 1):S11-S61. 06/01/2010    Radiology: Dg Chest 2 View  12/10/2013   CLINICAL DATA:  Chest pain.  Cough.  EXAM: CHEST  2 VIEW  COMPARISON:  Chest CT and chest x-ray 06/02/2013  FINDINGS: The heart is mildly enlarged but stable. The mediastinal and hilar contours are unchanged. Two right ventricular pacer wires are unchanged. Severe chronic pleural and parenchymal scarring changes involving the left lung but no definite acute overlying pulmonary process.  IMPRESSION: Chronic lung changes without definite acute overlying pulmonary process.   Electronically Signed   By: Kalman Jewels M.D.   On: 12/10/2013 18:58    EKG: AFib with controlled rate.  Nonspecific TWA  ASSESSMENT AND PLAN:  1. Atrial fibrillation- rate controlled. Continue diltiazem and metoprolol. 2. Embolus to right leg. Probably due to AFib. On IV heparin. Transition to coumadin when OK with vascular surgery. Stressed importance of compliance but given his history I think this will be an ongoing problem. Will check Echo. 3. Nonischemic CM.  Reassess LV function. Appears to be compensated at this time. 4. History of recurrent DVT and portal vein thrombosis.   Signed: Jarvin Ogren [place], [place]  12/11/2013, 4:22 PM

## 2013-12-12 DIAGNOSIS — M25559 Pain in unspecified hip: Secondary | ICD-10-CM

## 2013-12-12 LAB — CBC
HCT: 27.3 % — ABNORMAL LOW (ref 39.0–52.0)
Hemoglobin: 8.9 g/dL — ABNORMAL LOW (ref 13.0–17.0)
MCH: 30.9 pg (ref 26.0–34.0)
MCHC: 32.6 g/dL (ref 30.0–36.0)
MCV: 94.8 fL (ref 78.0–100.0)
PLATELETS: 301 10*3/uL (ref 150–400)
RBC: 2.88 MIL/uL — ABNORMAL LOW (ref 4.22–5.81)
RDW: 13.4 % (ref 11.5–15.5)
WBC: 9.8 10*3/uL (ref 4.0–10.5)

## 2013-12-12 LAB — PROTIME-INR
INR: 1.36 (ref 0.00–1.49)
PROTHROMBIN TIME: 16.8 s — AB (ref 11.6–15.2)

## 2013-12-12 LAB — HEPARIN LEVEL (UNFRACTIONATED)
Heparin Unfractionated: 0.77 IU/mL — ABNORMAL HIGH (ref 0.30–0.70)
Heparin Unfractionated: 0.58 IU/mL (ref 0.30–0.70)

## 2013-12-12 LAB — POCT I-STAT 4, (NA,K, GLUC, HGB,HCT)
Glucose, Bld: 48 mg/dL — ABNORMAL LOW (ref 70–99)
HCT: 52 % (ref 39.0–52.0)
Hemoglobin: 17.7 g/dL — ABNORMAL HIGH (ref 13.0–17.0)
Potassium: 4.4 mEq/L (ref 3.7–5.3)
Sodium: 146 mEq/L (ref 137–147)

## 2013-12-12 NOTE — Progress Notes (Signed)
ANTICOAGULATION CONSULT NOTE - Follow Up Consult  Pharmacy Consult for heparin  Indication: s/p right femoral embolectomy and afib  Allergies  Allergen Reactions  . Digoxin And Related     Dig toxicity 2009  . Penicillins Rash    Patient Measurements: Height: 6\' 3"  (190.5 cm) Weight: 175 lb 0.7 oz (79.4 kg) IBW/kg (Calculated) : 84.5 Heparin Dosing Weight: 79.4 kg  Vital Signs: Temp: 97.5 F (36.4 C) (07/26 1251) Temp src: Oral (07/26 1251) BP: 120/80 mmHg (07/26 1251) Pulse Rate: 81 (07/26 1251)  Labs:  Recent Labs  12/10/13 1810 12/10/13 2100 12/11/13 0243 12/11/13 0500 12/11/13 1850 12/12/13 0230 12/12/13 0819 12/12/13 1650  HGB 16.0 17.7*  --  14.0  --  8.9*  --   --   HCT 48.8 52.0  --  43.4  --  27.3*  --   --   PLT 137*  --   --  140*  --  301  --   --   LABPROT 13.0  --   --   --   --   --  16.8*  --   INR 0.98  --   --   --   --   --  1.36  --   HEPARINUNFRC  --   --   --   --  0.44  --  0.77* 0.58  CREATININE 1.73*  --  1.61*  --   --   --   --   --   TROPONINI <0.30  --   --   --   --   --   --   --     Estimated Creatinine Clearance: 48.6 ml/min (by C-G formula based on Cr of 1.61).   Medications:  Scheduled:  . buPROPion  75 mg Oral BID WC  . diltiazem  240 mg Oral Daily  . docusate sodium  100 mg Oral Daily  . metoprolol succinate  50 mg Oral BID  . pantoprazole  40 mg Oral Daily  . sodium bicarbonate  1,300 mg Oral BID  . tiotropium  18 mcg Inhalation Daily  . warfarin  7.5 mg Oral Once per day on Sun Tue Wed Thu Sat   And  . [START ON 12/13/2013] warfarin  10 mg Oral Once per day on Mon Fri  . Warfarin - Physician Dosing Inpatient   Does not apply q1800   Infusions:  . dextrose 5 % and 0.45 % NaCl with KCl 20 mEq/L 75 mL/hr at 12/12/13 0400  . heparin 900 Units/hr (12/12/13 1024)    Assessment: 68 yo male s/p right femoral embolectomy and afib is currently on heparin.  Heparin level is therapeutic after rate adjustment.  Patient is  also on coumadin dosed by MD.  INR today is 1.36.  Hgb dropped to 8.9 and Plt stable at 301 K  Goal of Therapy:  Heparin level 0.3-0.7 units/ml Monitor platelets by anticoagulation protocol: Yes   Plan:  1) continue Heparin at 900 units/hr.  Check heparin level with AM labs.  2) Monitor CBC and signs of bleeding closely 3) Continue home coumadin dose by MD  Legrand Como, Pharm.D., BCPS, AAHIVP Clinical Pharmacist Phone: [phone] or [phone] 12/12/2013, 5:55 PM

## 2013-12-12 NOTE — Progress Notes (Signed)
Pt to [place] to 2W-01, VSS (afib rate controlled), called report.

## 2013-12-12 NOTE — Progress Notes (Signed)
Subjective: Interval History: none.. ports less discomfort.  Objective: Vital signs in last 24 hours: Temp:  [97.5 F (36.4 C)-98.9 F (37.2 C)] 97.6 F (36.4 C) (07/26 0831) Pulse Rate:  [89-110] 98 (07/26 0420) Resp:  [25-28] 25 (07/26 0420) BP: (94-112)/(65-85) 106/80 mmHg (07/26 0831) SpO2:  [98 %-100 %] 98 % (07/26 0420)  Intake/Output from previous day: 07/25 0701 - 07/26 0700 In: 664.7 [I.V.:664.7] Out: 875 [Urine:875] Intake/Output this shift: Total I/O In: -  Out: 250 [Urine:250]  Right groin dressing removed with a healing. 2+ right dorsalis pedis pulse. Fasciotomy dressings were removed. Muscles are viable. Tender with the removal of the dressing. Does have some increased motor function in his right foot today compared to yesterday.  Lab Results:  Recent Labs  12/11/13 0500 12/12/13 0230  WBC 9.9 9.8  HGB 14.0 8.9*  HCT 43.4 27.3*  PLT 140* 301   BMET  Recent Labs  12/10/13 1810 12/10/13 2311 12/11/13 0243  NA 143  --  142  K 7.0* 4.6 4.7  CL 105  --  106  CO2 22  --  22  GLUCOSE 86  --  151*  BUN 23  --  22  CREATININE 1.73*  --  1.61*  CALCIUM 9.8  --  8.9    Studies/Results: Dg Chest 2 View  12/10/2013   CLINICAL DATA:  Chest pain.  Cough.  EXAM: CHEST  2 VIEW  COMPARISON:  Chest CT and chest x-ray 06/02/2013  FINDINGS: The heart is mildly enlarged but stable. The mediastinal and hilar contours are unchanged. Two right ventricular pacer wires are unchanged. Severe chronic pleural and parenchymal scarring changes involving the left lung but no definite acute overlying pulmonary process.  IMPRESSION: Chronic lung changes without definite acute overlying pulmonary process.   Electronically Signed   By: Kalman Jewels M.D.   On: 12/10/2013 18:58   Anti-infectives: Anti-infectives   Start     Dose/Rate Route Frequency Ordered Stop   12/11/13 0800  vancomycin (VANCOCIN) IVPB 1000 mg/200 mL premix     1,000 mg 200 mL/hr over 60 Minutes Intravenous   Once 12/11/13 0050 12/11/13 1000      Assessment/Plan: s/p Procedure(s): Right Femoral Embolectomy; Fasciotomy Right Lower Leg with Intraoperative arteriogram (Right) INTRA OPERATIVE ARTERIOGRAM (Right) Stable overall. Appreciate cardiology evaluation. On full dose heparin currently.   LOS: 2 days   [name] 12/12/2013, 9:19 AM

## 2013-12-12 NOTE — Progress Notes (Signed)
Pharmacist Heart Failure Core Measure Documentation  Assessment: [name] has an EF documented as 45-50% on 05/27/13 by ECHO.  Rationale: Heart failure patients with left ventricular systolic dysfunction (LVSD) and an EF < 40% should be prescribed an angiotensin converting enzyme inhibitor (ACEI) or angiotensin receptor blocker (ARB) at discharge unless a contraindication is documented in the medical record.  This patient is not currently on an ACEI or ARB for HF.  This note is being placed in the record in order to provide documentation that a contraindication to the use of these agents is present for this encounter.  ACE Inhibitor or Angiotensin Receptor Blocker is contraindicated (specify all that apply)  []   ACEI allergy AND ARB allergy []   Angioedema []   Moderate or severe aortic stenosis []   Hyperkalemia [x]   Hypotension []   Renal artery stenosis []   Worsening renal function, preexisting renal disease or dysfunction   Edilia Ghuman, Tsz-Yin 12/12/2013 8:34 AM

## 2013-12-12 NOTE — Evaluation (Signed)
Physical Therapy Evaluation Patient Details Name: [name] MRN: [id number] DOB: August 18, 1945 Today's Date: 12/12/2013   History of Present Illness    68 yo presents with a right femoral embolus with cold leg associated with neuropathy and loss of function, now s/p embolectomy and fasciotomy of right leg.Patient has a history of permanent Afib and nonischemic cardiomyopathy. He has a history of multiple DVTs and portal vein thrombosis. He has been completely noncompliant with anticoagulation therapy.    Clinical Impression  Pt seen for brief mobility eval in process of transfer to floor.  Presents with moderate limitations to functional mobility related to pain and loss of function of right leg, and reports painful callous/corn on calcaneal surface of left heel preventing full weightbearing of nonoperative leg.  Will benefit from acute PT to address deficits and assist with d/c planning.  Pt lives with roommate and must be independent to d/c home.  Hopeful he will be able to go home with HHPT but may need to consider SNF depending on progress with recovery.      Follow Up Recommendations SNF    Equipment Recommendations  None recommended by PT    Recommendations for Other Services       Precautions / Restrictions Precautions Precautions: Fall Precaution Comments: leg pain in operative and nonoperative side Restrictions Weight Bearing Restrictions: No Other Position/Activity Restrictions: elevate leg when not in use      Mobility  Bed Mobility Overal bed mobility: Needs Assistance Bed Mobility: Supine to Sit     Supine to sit: Supervision     General bed mobility comments: moves well to EOB with some incr pain in leg once dependent  Transfers Overall transfer level: Needs assistance Equipment used: 1 person hand held assist Transfers: Sit to/from [employer] Sit to Stand: Mod assist   Squat pivot transfers: Mod assist     General transfer comment: Pt  lacks perception of foot on floor and reluctant to maximize PT for supprt for standing pivot to [facility] but with 90 degree position and instruction for hand placement able to squat pivot on operated leg to chair.  Pt anxious to try RW for ambulation but transfering out of SDU.  Ambulation/Gait             General Gait Details: Will need RW for gait due to RLE deficits pre/post surgery, but pt also has corn/callous on center of calcaneal surface and may benefit from off-loading with padding  Stairs            Wheelchair Mobility    Modified Rankin (Stroke Patients Only)       Balance Overall balance assessment: Needs assistance Sitting-balance support: Feet supported;No upper extremity supported Sitting balance-Leahy Scale: Normal     Standing balance support: Bilateral upper extremity supported;During functional activity Standing balance-Leahy Scale: Poor Standing balance comment: primarily due to pain with WB                             Pertinent Vitals/Pain Moderate pain operated leg and on non-op heel     Home Living Family/patient expects to be discharged to:: Private residence Living Arrangements: Non-relatives/Friends Available Help at Discharge: Other (Comment) (roommate but pt needs to be Independent) Type of Home: House Home Access: Stairs to enter Entrance Stairs-Rails: Can reach both Entrance Stairs-Number of Steps: 3 Home Layout: One level Home Equipment: Latina Craver - 2 wheels      Prior  Function Level of Independence: Independent with assistive device(s)               Hand Dominance   Dominant Hand: Right    Extremity/Trunk Assessment   Upper Extremity Assessment: Defer to OT evaluation;Overall WFL for tasks assessed           Lower Extremity Assessment: RLE deficits/detail RLE Deficits / Details: s/p embolectomy/fasciotomy       Communication   Communication: No difficulties  Cognition Arousal/Alertness:  Awake/alert Behavior During Therapy: WFL for tasks assessed/performed Overall Cognitive Status: Within Functional Limits for tasks assessed                      General Comments General comments (skin integrity, edema, etc.): As noted, painful callous/corn on center of calcaneal surface of LEFT foot    Exercises        Assessment/Plan    PT Assessment Patient needs continued PT services  PT Diagnosis Difficulty walking;Acute pain   PT Problem List Pain;Decreased knowledge of use of DME;Decreased mobility;Decreased balance;Decreased activity tolerance;Decreased range of motion;Decreased strength;Impaired sensation  PT Treatment Interventions DME instruction;Gait training;Stair training;Therapeutic activities;Functional mobility training;Balance training;Patient/family education   PT Goals (Current goals can be found in the Care Plan section) Acute Rehab PT Goals Patient Stated Goal: take care of myself, not need help PT Goal Formulation: With patient Time For Goal Achievement: 12/19/13 Potential to Achieve Goals: Good    Frequency Min 3X/week   Barriers to discharge Decreased caregiver support needs to be Independent to return home    Co-evaluation               End of Session   Activity Tolerance: Patient limited by pain Patient left: in chair;with nursing/sitter in room (transferring to floor) Nurse Communication: Mobility status;Weight bearing status         Time: 1205-1220 PT Time Calculation (min): 15 min   Charges:   PT Evaluation $Initial PT Evaluation Tier I: 1 Procedure PT Treatments $Therapeutic Activity: 8-22 mins   PT G Codes:          Herbie Drape 12/12/2013, 1:41 PM

## 2013-12-12 NOTE — Progress Notes (Signed)
Patient Name: [name] Date of Encounter: 12/12/2013     Active Problems:   PVD (peripheral vascular disease)    SUBJECTIVE  Patient denies any chest pain or dyspnea. Rhythm stable permanent atrial fib.  CURRENT MEDS . buPROPion  75 mg Oral BID WC  . diltiazem  240 mg Oral Daily  . docusate sodium  100 mg Oral Daily  . metoprolol succinate  50 mg Oral BID  . pantoprazole  40 mg Oral Daily  . sodium bicarbonate  1,300 mg Oral BID  . tiotropium  18 mcg Inhalation Daily  . warfarin  7.5 mg Oral Once per day on Sun Tue Wed Thu Sat   And  . [START ON 12/13/2013] warfarin  10 mg Oral Once per day on Mon Fri  . Warfarin - Physician Dosing Inpatient   Does not apply q1800    OBJECTIVE  Filed Vitals:   12/11/13 2006 12/11/13 2329 12/12/13 0420 12/12/13 0831  BP: 110/82 95/68 94/65  106/80  Pulse: 110 89 98   Temp: 97.9 F (36.6 C) 98.2 F (36.8 C) 97.5 F (36.4 C) 97.6 F (36.4 C)  TempSrc: Oral Oral Oral Oral  Resp: 28 26 25    Height:      Weight:      SpO2: 100% 99% 98%     Intake/Output Summary (Last 24 hours) at 12/12/13 1221 Last data filed at 12/12/13 1200  Gross per 24 hour  Intake 1845.5 ml  Output   1125 ml  Net  720.5 ml   Filed Weights   12/10/13 1625 12/11/13 0024  Weight: 172 lb (78.019 kg) 175 lb 0.7 oz (79.4 kg)    PHYSICAL EXAM  General: Pleasant, NAD. Neuro: Alert and oriented X 3. Moves all extremities spontaneously. Psych: Normal affect. HEENT:  Normal  Neck: Supple without bruits or JVD. Lungs:  Resp regular and unlabored, CTA. Heart: Irregularly irregular. no s3, s4, Grade 1/6 apical systolic murmur Abdomen: Soft, non-tender, non-distended, BS + x 4.  Extremities: No clubbing, cyanosis or edema. Feet warm.  Accessory Clinical Findings  CBC  Recent Labs  12/11/13 0500 12/12/13 0230  WBC 9.9 9.8  HGB 14.0 8.9*  HCT 43.4 27.3*  MCV 90.6 94.8  PLT 140* 539   Basic Metabolic Panel  Recent Labs  12/10/13 1810  12/10/13 2311 12/11/13 0243  NA 143  --  142  K 7.0* 4.6 4.7  CL 105  --  106  CO2 22  --  22  GLUCOSE 86  --  151*  BUN 23  --  22  CREATININE 1.73*  --  1.61*  CALCIUM 9.8  --  8.9   Liver Function Tests  Recent Labs  12/10/13 1810  AST 20  ALT 11  ALKPHOS 89  BILITOT 1.1  PROT 7.5  ALBUMIN 4.4   No results found for this basename: LIPASE, AMYLASE,  in the last 72 hours Cardiac Enzymes  Recent Labs  12/10/13 1810  TROPONINI <0.30   BNP No components found with this basename: POCBNP,  D-Dimer No results found for this basename: DDIMER,  in the last 72 hours Hemoglobin A1C No results found for this basename: HGBA1C,  in the last 72 hours Fasting Lipid Panel No results found for this basename: CHOL, HDL, LDLCALC, TRIG, CHOLHDL, LDLDIRECT,  in the last 72 hours Thyroid Function Tests No results found for this basename: TSH, T4TOTAL, FREET3, T3FREE, THYROIDAB,  in the last 72 hours  TELE  Atrial fib with controlled  VR  ECG    Radiology/Studies  Dg Chest 2 View  12/10/2013   CLINICAL DATA:  Chest pain.  Cough.  EXAM: CHEST  2 VIEW  COMPARISON:  Chest CT and chest x-ray 06/02/2013  FINDINGS: The heart is mildly enlarged but stable. The mediastinal and hilar contours are unchanged. Two right ventricular pacer wires are unchanged. Severe chronic pleural and parenchymal scarring changes involving the left lung but no definite acute overlying pulmonary process.  IMPRESSION: Chronic lung changes without definite acute overlying pulmonary process.   Electronically Signed   By: Kalman Jewels M.D.   On: 12/10/2013 18:58    ASSESSMENT AND PLAN  1. Atrial fibrillation- rate controlled. Continue diltiazem and metoprolol.  2. Embolus to right leg. Probably due to AFib. On IV heparin. Transitioning to coumadin . Stressed importance of compliance but given his history I think this will be an ongoing problem.He states he will start to take his warfarin in the mornings so he  can remember to take it.  3. Nonischemic CM. Reassess LV function. Appears to be compensated at this time.  Echo pending..  4. History of recurrent DVT and portal vein thrombosis.   Signed, Darlin Coco MD

## 2013-12-12 NOTE — Progress Notes (Signed)
ANTICOAGULATION CONSULT NOTE - Follow Up Consult  Pharmacy Consult for heparin  Indication: s/p right femoral embolectomy and afib  Allergies  Allergen Reactions  . Digoxin And Related     Dig toxicity 2009  . Penicillins Rash    Patient Measurements: Height: 6\' 3"  (190.5 cm) Weight: 175 lb 0.7 oz (79.4 kg) IBW/kg (Calculated) : 84.5 Heparin Dosing Weight: 79.4 kg  Vital Signs: Temp: 97.5 F (36.4 C) (07/26 0420) Temp src: Oral (07/26 0420) BP: 94/65 mmHg (07/26 0420) Pulse Rate: 98 (07/26 0420)  Labs:  Recent Labs  12/10/13 1810 12/11/13 0243 12/11/13 0500 12/11/13 1850 12/12/13 0230  HGB 16.0  --  14.0  --  8.9*  HCT 48.8  --  43.4  --  27.3*  PLT 137*  --  140*  --  301  LABPROT 13.0  --   --   --   --   INR 0.98  --   --   --   --   HEPARINUNFRC  --   --   --  0.44  --   CREATININE 1.73* 1.61*  --   --   --   TROPONINI <0.30  --   --   --   --     Estimated Creatinine Clearance: 48.6 ml/min (by C-G formula based on Cr of 1.61).   Medications:  Scheduled:  . buPROPion  75 mg Oral BID WC  . diltiazem  240 mg Oral Daily  . docusate sodium  100 mg Oral Daily  . metoprolol succinate  50 mg Oral BID  . pantoprazole  40 mg Oral Daily  . sodium bicarbonate  1,300 mg Oral BID  . tiotropium  18 mcg Inhalation Daily  . warfarin  7.5 mg Oral Once per day on Sun Tue Wed Thu Sat   And  . [START ON 12/13/2013] warfarin  10 mg Oral Once per day on Mon Fri  . Warfarin - Physician Dosing Inpatient   Does not apply q1800   Infusions:  . dextrose 5 % and 0.45 % NaCl with KCl 20 mEq/L 75 mL/hr at 12/12/13 0400  . heparin 1,000 Units/hr (12/11/13 0919)    Assessment: 68 yo male s/p right femoral embolectomy and afib is currently on supratherapeutic heparin.  Heparin level is 0.77.  Patient is also on coumadin dosed by MD.  INR today is 1.36.  Hgb dropped to 8.9 and Plt stable at 301 K  Goal of Therapy:  Heparin level 0.3-0.7 units/ml Monitor platelets by  anticoagulation protocol: Yes   Plan:  1) Reduce heparin to 900 units/hr. 8hr heparin level.  2) Monitor CBC and signs of bleeding closely 3) Continue home coumadin dose by MD  Chelcee Korpi, Tsz-Yin 12/12/2013,8:29 AM

## 2013-12-12 NOTE — Progress Notes (Signed)
Pt refusing to get oob even after multiple attempts to encourage said he would get up later

## 2013-12-13 DIAGNOSIS — N183 Chronic kidney disease, stage 3 unspecified: Secondary | ICD-10-CM

## 2013-12-13 DIAGNOSIS — I739 Peripheral vascular disease, unspecified: Secondary | ICD-10-CM

## 2013-12-13 DIAGNOSIS — I742 Embolism and thrombosis of arteries of the upper extremities: Secondary | ICD-10-CM

## 2013-12-13 DIAGNOSIS — Z7901 Long term (current) use of anticoagulants: Secondary | ICD-10-CM

## 2013-12-13 LAB — CBC
HEMATOCRIT: 31.8 % — AB (ref 39.0–52.0)
HEMOGLOBIN: 10.2 g/dL — AB (ref 13.0–17.0)
MCH: 29 pg (ref 26.0–34.0)
MCHC: 32.1 g/dL (ref 30.0–36.0)
MCV: 90.3 fL (ref 78.0–100.0)
Platelets: 150 10*3/uL (ref 150–400)
RBC: 3.52 MIL/uL — ABNORMAL LOW (ref 4.22–5.81)
RDW: 15 % (ref 11.5–15.5)
WBC: 11.8 10*3/uL — AB (ref 4.0–10.5)

## 2013-12-13 LAB — BASIC METABOLIC PANEL
Anion gap: 10 (ref 5–15)
BUN: 26 mg/dL — ABNORMAL HIGH (ref 6–23)
CHLORIDE: 108 meq/L (ref 96–112)
CO2: 25 meq/L (ref 19–32)
Calcium: 8.4 mg/dL (ref 8.4–10.5)
Creatinine, Ser: 1.83 mg/dL — ABNORMAL HIGH (ref 0.50–1.35)
GFR calc Af Amer: 42 mL/min — ABNORMAL LOW (ref >90)
GFR calc non Af Amer: 36 mL/min — ABNORMAL LOW (ref >90)
Glucose, Bld: 103 mg/dL — ABNORMAL HIGH (ref 70–99)
POTASSIUM: 4.9 meq/L (ref 3.7–5.3)
Sodium: 143 mEq/L (ref 137–147)

## 2013-12-13 LAB — PROTIME-INR
INR: 1.49 (ref 0.00–1.49)
Prothrombin Time: 18 seconds — ABNORMAL HIGH (ref 11.6–15.2)

## 2013-12-13 LAB — HEPARIN LEVEL (UNFRACTIONATED): Heparin Unfractionated: 0.42 IU/mL (ref 0.30–0.70)

## 2013-12-13 NOTE — Progress Notes (Addendum)
  [facility] Progress Note  12/13/2013 8:58 AM 3 Days Post-Op  Subjective:  Some numbness in right foot. Denies chest pain or shortness of breath.   Tmax 98.6 BP 102/58 02 97%   Filed Vitals:   12/13/13 0450  BP: 102/68  Pulse: 96  Temp: 98.6 F (37 C)  Resp: 20    Physical Exam: Incisions:  Right groin incision c/d/i. Active oozing at proximal end of lateral fasciotomy incision with clot. Minimal drainage at medial fasciotomy site. Muscle is viable in lower leg.  Extremities:  Palpable right DP pulse. Brisk right PT/DP doppler signals. Increased motor activity of right foot. Minimal sensation on right lower leg below knee. No sensation of right foot.   CBC    Component Value Date/Time   WBC 11.8* 12/13/2013 0547   RBC 3.52* 12/13/2013 0547   RBC 4.61 12/22/2008 0515   HGB 10.2* 12/13/2013 0547   HCT 31.8* 12/13/2013 0547   PLT 150 12/13/2013 0547   MCV 90.3 12/13/2013 0547   MCH 29.0 12/13/2013 0547   MCHC 32.1 12/13/2013 0547   RDW 15.0 12/13/2013 0547   LYMPHSABS 4.0 11/30/2012 1513   MONOABS 0.9 11/30/2012 1513   EOSABS 0.4 11/30/2012 1513   BASOSABS 0.0 11/30/2012 1513    BMET    Component Value Date/Time   NA 143 12/13/2013 0547   K 4.9 12/13/2013 0547   CL 108 12/13/2013 0547   CO2 25 12/13/2013 0547   GLUCOSE 103* 12/13/2013 0547   BUN 26* 12/13/2013 0547   CREATININE 1.83* 12/13/2013 0547   CREATININE 1.72* 07/16/2013 1558   CALCIUM 8.4 12/13/2013 0547   GFRNONAA 36* 12/13/2013 0547   GFRNONAA 40* 07/16/2013 1558   GFRAA 42* 12/13/2013 0547   GFRAA 46* 07/16/2013 1558    INR    Component Value Date/Time   INR 1.49 12/13/2013 0547   INR 1.7 10/05/2013 1541     Intake/Output Summary (Last 24 hours) at 12/13/13 0858 Last data filed at 12/13/13 0700  Gross per 24 hour  Intake 1395.83 ml  Output    450 ml  Net 945.83 ml     Assessment:  68 y.o. male is s/p:  Right Femoral Embolectomy; Fasciotomy Right Lower Leg with Intraoperative arteriogram  (Right)  INTRA OPERATIVE ARTERIOGRAM (Right)  3 Days Post-Op  Plan: -Continue bid dressings to lower fasciotomy incisions. Increase dressing changes if saturated. Will plan to bring to OR on Wednesday 12/15/13 for fasciotomy closures.  -Hgb trending upwards. Platelets 150 today. Down from 70 yesterday. Will monitor.  -Creatinine slightly elevated today at 1.83. His baseline is 1.7. Will monitor.  -Appreciate cardiology following.  -DVT prophylaxis: Heparin. Level is therapeutic. Continue home coumadin.    Virgina Jock, PA-C [facility] Office: [phone] Pager: [phone] 12/13/2013 8:58 AM    I have examined the patient, reviewed and agree with above. Continues to have 2+ dorsalis pedis pulse. Better motor function in his right foot today. Muscle appeared viable. Some continued venous oozing from the lateral fasciotomy site. Will plan on returning to the operating room on Wednesday for fasciotomy closure  EARLY, TODD, MD 12/13/2013 12:27 PM

## 2013-12-13 NOTE — Progress Notes (Signed)
ANTICOAGULATION CONSULT NOTE - Follow Up Consult  Pharmacy Consult for heparin  Indication: s/p right femoral embolectomy and afib  Allergies  Allergen Reactions  . Digoxin And Related     Dig toxicity 2009  . Penicillins Rash    Patient Measurements: Height: 6\' 3"  (190.5 cm) Weight: 175 lb 0.7 oz (79.4 kg) IBW/kg (Calculated) : 84.5 Heparin Dosing Weight: 79.4 kg  Vital Signs: Temp: 97.6 F (36.4 C) (07/27 1100) Temp src: Oral (07/27 1100) BP: 116/72 mmHg (07/27 1100) Pulse Rate: 75 (07/27 1100)  Labs:  Recent Labs  12/10/13 1810  12/11/13 0243 12/11/13 0500  12/12/13 0230 12/12/13 0819 12/12/13 1650 12/13/13 0547  HGB 16.0  < >  --  14.0  --  8.9*  --   --  10.2*  HCT 48.8  < >  --  43.4  --  27.3*  --   --  31.8*  PLT 137*  --   --  140*  --  301  --   --  150  LABPROT 13.0  --   --   --   --   --  16.8*  --  18.0*  INR 0.98  --   --   --   --   --  1.36  --  1.49  HEPARINUNFRC  --   --   --   --   < >  --  0.77* 0.58 0.42  CREATININE 1.73*  --  1.61*  --   --   --   --   --  1.83*  TROPONINI <0.30  --   --   --   --   --   --   --   --   < > = values in this interval not displayed.  Estimated Creatinine Clearance: 42.8 ml/min (by C-G formula based on Cr of 1.83).   Medications:  Scheduled:  . buPROPion  75 mg Oral BID WC  . diltiazem  240 mg Oral Daily  . docusate sodium  100 mg Oral Daily  . metoprolol succinate  50 mg Oral BID  . pantoprazole  40 mg Oral Daily  . sodium bicarbonate  1,300 mg Oral BID  . tiotropium  18 mcg Inhalation Daily  . warfarin  7.5 mg Oral Once per day on Sun Tue Wed Thu Sat   And  . warfarin  10 mg Oral Once per day on Mon Fri  . Warfarin - Physician Dosing Inpatient   Does not apply q1800   Infusions:  . dextrose 5 % and 0.45 % NaCl with KCl 20 mEq/L 75 mL/hr at 12/12/13 0400  . heparin 900 Units/hr (12/12/13 1024)    Assessment: 68 yo male POD#3 s/p right femoral embolectomy and afib. Heparin level 0.42 is  therapeutic on 900 units/hr IV heparin infusion. Patient is also on coumadin dosed by MD.  INR today is 1.49.  Hgb better today at 10.2 and Pltc 150K down from 301 yesterday but 137K on admit. MD monitoring.  MD reported patient denies chest pain or shortness of breath this AM.  No bleeding documented.  Goal of Therapy:  Heparin level 0.3-0.7 units/ml Monitor platelets by anticoagulation protocol: Yes   Plan:  1) Continue Heparin at 900 units/hr.  Check heparin level with AM labs.  2) Monitor CBC and signs of bleeding closely 3) MD dosing coumadin-continuing home coumadin dose.   Nicole Cella, RPh Clinical Pharmacist Pager: [phone] 12/13/2013, 12:29 PM

## 2013-12-13 NOTE — Progress Notes (Signed)
VASCULAR LAB PRELIMINARY  ARTERIAL  ABI completed:    RIGHT    LEFT    PRESSURE WAVEFORM  PRESSURE WAVEFORM  BRACHIAL 113 Triphasic BRACHIAL 115 Triphasic  DP 120 Triphasic DP 149 Triphasic  PT 144 Triphasic PT 145 Triphasic    RIGHT LEFT  ABI 1.25 1.30   ABIs and Doppler waveforms are within normal limits bilaterally at rest.  Belia Febo, RVS 12/13/2013, 10:07 AM

## 2013-12-13 NOTE — Care Management Note (Signed)
    Page 1 of 1   12/20/2013     3:31:08 PM CARE MANAGEMENT NOTE 12/20/2013  Patient:  [name], [name]   Account Number:  [id number]  Date Initiated:  12/13/2013  Documentation initiated by:  Chasitty Hehl  Subjective/Objective Assessment:   Pt adm s/p rt femoral embolectomy and rt lower extremity fasciotomy on 12/10/13.  PTA, pt resided at home with spouse.     Action/Plan:   PT recommending SNF at dc.  CSW consulted to facilitate dc to SNF when medically stable for dc.  Will follow progress.   Anticipated DC Date:  12/20/2013   Anticipated DC Plan:  SKILLED NURSING FACILITY  In-house referral  Clinical Social Worker      DC Planning Services  CM consult      Choice offered to / List presented to:             Status of service:  Completed, signed off Medicare Important Message given?  YES (If response is "NO", the following Medicare IM given date fields will be blank) Date Medicare IM given:  12/13/2013 Medicare IM given by:  Jayziah Bankhead Date Additional Medicare IM given:  12/20/2013 Additional Medicare IM given by:  Avis Mcmahill  Discharge Disposition:  [place]  Per UR Regulation:  Reviewed for med. necessity/level of care/duration of stay  If discussed at [place] of Stay Meetings, dates discussed:   12/16/2013    Comments:  12/20/13 Ellan Lambert, RN, BSN [phone] Pt discharged to SNF today, per CSW arrangements.  12/17/13 Ellan Lambert, RN, BSN [phone] CSW cont to follow to facilitate dc to SNF; dc likely Mon, 8/3.

## 2013-12-13 NOTE — Progress Notes (Signed)
Patient Name: [name] Date of Encounter: 12/13/2013     Active Problems:   PVD (peripheral vascular disease)    SUBJECTIVE  Patient denies any chest pain or dyspnea. Rhythm stable permanent atrial fib.  CURRENT MEDS . buPROPion  75 mg Oral BID WC  . diltiazem  240 mg Oral Daily  . docusate sodium  100 mg Oral Daily  . metoprolol succinate  50 mg Oral BID  . pantoprazole  40 mg Oral Daily  . sodium bicarbonate  1,300 mg Oral BID  . tiotropium  18 mcg Inhalation Daily  . warfarin  7.5 mg Oral Once per day on Sun Tue Wed Thu Sat   And  . warfarin  10 mg Oral Once per day on Mon Fri  . Warfarin - Physician Dosing Inpatient   Does not apply q1800    OBJECTIVE  Filed Vitals:   12/12/13 0831 12/12/13 1251 12/12/13 2034 12/13/13 0450  BP: 106/80 120/80 100/69 102/68  Pulse:  81 68 96  Temp: 97.6 F (36.4 C) 97.5 F (36.4 C) 97.8 F (36.6 C) 98.6 F (37 C)  TempSrc: Oral Oral Oral Oral  Resp:  20 21 20   Height:      Weight:      SpO2:  100% 97% 97%    Intake/Output Summary (Last 24 hours) at 12/13/13 0808 Last data filed at 12/13/13 0300  Gross per 24 hour  Intake 1755.83 ml  Output    500 ml  Net 1255.83 ml   Filed Weights   12/10/13 1625 12/11/13 0024  Weight: 172 lb (78.019 kg) 175 lb 0.7 oz (79.4 kg)    PHYSICAL EXAM  General: Pleasant, NAD. Neuro: Alert and oriented X 3. Moves all extremities spontaneously. Psych: Normal affect. HEENT:  Normal  Neck: Supple without bruits or JVD. Lungs:  Resp regular and unlabored, CTA. Heart: Irregularly irregular. no s3, s4, Grade 1/6 apical systolic murmur Abdomen: Soft, non-tender, non-distended, BS + x 4.  Extremities: No clubbing, cyanosis or edema. Feet warm.  Accessory Clinical Findings  CBC  Recent Labs  12/12/13 0230 12/13/13 0547  WBC 9.8 11.8*  HGB 8.9* 10.2*  HCT 27.3* 31.8*  MCV 94.8 90.3  PLT 301 237   Basic Metabolic Panel  Recent Labs  12/11/13 0243 12/13/13 0547  NA 142  143  K 4.7 4.9  CL 106 108  CO2 22 25  GLUCOSE 151* 103*  BUN 22 26*  CREATININE 1.61* 1.83*  CALCIUM 8.9 8.4   Liver Function Tests  Recent Labs  12/10/13 1810  AST 20  ALT 11  ALKPHOS 89  BILITOT 1.1  PROT 7.5  ALBUMIN 4.4   No results found for this basename: LIPASE, AMYLASE,  in the last 72 hours Cardiac Enzymes  Recent Labs  12/10/13 1810  TROPONINI <0.30   BNP No components found with this basename: POCBNP,  D-Dimer No results found for this basename: DDIMER,  in the last 72 hours Hemoglobin A1C No results found for this basename: HGBA1C,  in the last 72 hours Fasting Lipid Panel No results found for this basename: CHOL, HDL, LDLCALC, TRIG, CHOLHDL, LDLDIRECT,  in the last 72 hours Thyroid Function Tests No results found for this basename: TSH, T4TOTAL, FREET3, T3FREE, THYROIDAB,  in the last 72 hours  TELE  Atrial fib with controlled VR; higher rates with pain.  ECG    Radiology/Studies  Dg Chest 2 View  12/10/2013   CLINICAL DATA:  Chest pain.  Cough.  EXAM: CHEST  2 VIEW  COMPARISON:  Chest CT and chest x-ray 06/02/2013  FINDINGS: The heart is mildly enlarged but stable. The mediastinal and hilar contours are unchanged. Two right ventricular pacer wires are unchanged. Severe chronic pleural and parenchymal scarring changes involving the left lung but no definite acute overlying pulmonary process.  IMPRESSION: Chronic lung changes without definite acute overlying pulmonary process.   Electronically Signed   By: Kalman Jewels M.D.   On: 12/10/2013 18:58    ASSESSMENT AND PLAN  1. Atrial fibrillation- rate controlled. Continue diltiazem and metoprolol. Increased rate with dressing change. 2. Embolus to right leg. Probably due to AFib. On IV heparin. Transitioning to coumadin . Stressed importance of compliance.  Explained that he could have a stroke in the future if he is not on anticoagulation.  He understood the risks of not taking coumadin.  3.  Nonischemic CM. Reassess LV function. Appears to be compensated at this time.  Echo pending.  4. History of recurrent DVT and portal vein thrombosis. 5. CRI: Cr slightly increased.   Signed, Gicela Schwarting S. MD

## 2013-12-13 NOTE — Progress Notes (Signed)
OT Cancellation Note  Patient Details Name: [name] MRN: [id number] DOB: Feb 14, 1946   Cancelled Treatment:    Reason Eval/Treat Not Completed: Pain limiting ability to participate - pt adamantly refusing to attempt OOB activity with OT citing pain Rt. LE, and callous on Lt. LE.  Pt states he will not get OOB today and won't try to walk until his leg "is closed up".   Explained importance of mobility and activity on healing and consequences of immobility, however, pt continues to refuse.  Will reattempt tomorrow.  Darlina Rumpf [place], OTR/L [phone]  12/13/2013, 11:28 AM

## 2013-12-13 NOTE — Plan of Care (Signed)
Problem: Phase I Progression Outcomes Goal: Weaning O2 to maintain Sats > 90% Outcome: Not Met (add Reason) Patient on RA

## 2013-12-14 ENCOUNTER — Encounter (HOSPITAL_COMMUNITY): Payer: Self-pay | Admitting: Vascular Surgery

## 2013-12-14 LAB — CBC
HEMATOCRIT: 31.5 % — AB (ref 39.0–52.0)
Hemoglobin: 10.1 g/dL — ABNORMAL LOW (ref 13.0–17.0)
MCH: 30.1 pg (ref 26.0–34.0)
MCHC: 32.1 g/dL (ref 30.0–36.0)
MCV: 93.8 fL (ref 78.0–100.0)
Platelets: 168 10*3/uL (ref 150–400)
RBC: 3.36 MIL/uL — AB (ref 4.22–5.81)
RDW: 15.5 % (ref 11.5–15.5)
WBC: 11.9 10*3/uL — AB (ref 4.0–10.5)

## 2013-12-14 LAB — PROTIME-INR
INR: 1.84 — ABNORMAL HIGH (ref 0.00–1.49)
PROTHROMBIN TIME: 21.3 s — AB (ref 11.6–15.2)

## 2013-12-14 LAB — HEPARIN LEVEL (UNFRACTIONATED): HEPARIN UNFRACTIONATED: 0.31 [IU]/mL (ref 0.30–0.70)

## 2013-12-14 MED ORDER — VANCOMYCIN HCL IN DEXTROSE 750-5 MG/150ML-% IV SOLN
750.0000 mg | Freq: Once | INTRAVENOUS | Status: AC
  Administered 2013-12-15: 750 mg via INTRAVENOUS
  Filled 2013-12-14: qty 150

## 2013-12-14 MED ORDER — HEPARIN (PORCINE) IN NACL 100-0.45 UNIT/ML-% IJ SOLN
950.0000 [IU]/h | INTRAMUSCULAR | Status: DC
  Administered 2013-12-14: 950 [IU]/h via INTRAVENOUS
  Filled 2013-12-14 (×4): qty 250

## 2013-12-14 NOTE — Progress Notes (Signed)
Clinical Social Work Department BRIEF PSYCHOSOCIAL ASSESSMENT 12/14/2013  Patient:  [name], [name]     Account Number:  [id number]     Admit date:  12/10/2013  Clinical Social Worker:  Megan Salon  Date/Time:  12/14/2013 11:43 AM  Referred by:  Physician  Date Referred:  12/14/2013 Referred for  SNF Placement   Other Referral:   Interview type:  Patient Other interview type:    PSYCHOSOCIAL DATA Living Status:  FAMILY Admitted from facility:   Level of care:   Primary support name:  Sonya Long Primary support relationship to patient:  CHILD, ADULT Degree of support available:   Good    CURRENT CONCERNS Current Concerns  Post-Acute Placement   Other Concerns:    SOCIAL WORK ASSESSMENT / PLAN Clinical Social Worker received referral for SNF placement at d/c. Clinical Social Worker met with patient at bedside to offer support and discuss patient needs at discharge.  CSW introduced self and explained reason for visit. Patient had visitor by bedside. CSW explained SNF process to patient. Patient reported he is agreeable for SNF placement and has been to [facility] before and does not want to go there. CSW encouraged patient to think about additional SNF options pending availability of preferred facility. CSW will complete FL2 for MD's signature and will update patient and family when bed offers are received.    CSW remains available for support and to facilitate patient discharge needs once medically ready.   Assessment/plan status:  Psychosocial Support/Ongoing Assessment of Needs Other assessment/ plan:   Information/referral to community resources:   SNF information    PATIENT'S/FAMILY'S RESPONSE TO PLAN OF CARE: Patient states he is okay with going to SNF for about a week.        Jeanette Caprice, MSW, [place]

## 2013-12-14 NOTE — Plan of Care (Signed)
Problem: Phase II Progression Outcomes Goal: Progress activity as tolerated unless otherwise ordered Outcome: Not Progressing Patient has been refusing PT.  Patient encouraged to work with PT to at least get OOB to chair tomorrow.  Patient agreed with this plan.

## 2013-12-14 NOTE — Progress Notes (Signed)
Patient Name: [name] Date of Encounter: 12/14/2013  Principal Problem:   PVD (peripheral vascular disease) Active Problems:   Long-term (current) use of anticoagulants   Non-ischemic cardiomyopathy   Atrial fibrillation    Patient Profile: 68 yo male w/ perm afib, noncompliant w/ coum, CKD, was admitted 07/24 w/ SOB, ischemic RLE, embolectomy 07/24. Cards following for afib.  SUBJECTIVE: Pt right heel painful, wheezes at times at home, was using Spiriva (rare) or albuterol prn.  OBJECTIVE Filed Vitals:   12/13/13 1100 12/13/13 1345 12/13/13 2100 12/14/13 0410  BP: 116/72 133/75 105/66 116/85  Pulse: 75 59 70 108  Temp: 97.6 F (36.4 C) 97.7 F (36.5 C) 98.2 F (36.8 C) 98.2 F (36.8 C)  TempSrc: Oral Oral Oral Oral  Resp:  18  18  Height:      Weight:      SpO2: 98% 95% 98% 96%    Intake/Output Summary (Last 24 hours) at 12/14/13 0756 Last data filed at 12/14/13 0700  Gross per 24 hour  Intake    456 ml  Output   1000 ml  Net   -544 ml   Filed Weights   12/10/13 1625 12/11/13 0024  Weight: 172 lb (78.019 kg) 175 lb 0.7 oz (79.4 kg)    PHYSICAL EXAM General: Well developed, well nourished, male in no acute distress. Head: Normocephalic, atraumatic.  Neck: Supple without bruits, JVD 8 cm. Lungs:  Resp regular and unlabored, insp/exp wheeze, few rales Heart: irregular, S1, S2, no S3, S4, or murmur; no rub. Abdomen: Soft, non-tender, non-distended, BS + x 4.  Extremities: No clubbing, cyanosis, no edema. Wounds bandaged, not disturbed. Good cap refill R, delayed on left Neuro: Alert and oriented X 3. Moves all extremities spontaneously. Psych: Normal affect.  LABS: CBC:  Recent Labs  12/13/13 0547 12/14/13 0416  WBC 11.8* 11.9*  HGB 10.2* 10.1*  HCT 31.8* 31.5*  MCV 90.3 93.8  PLT 150 168   INR:  Recent Labs  12/14/13 0416  INR 9.70*   Basic Metabolic Panel:  Recent Labs  12/13/13 0547  NA 143  K 4.9  CL 108  CO2 25    GLUCOSE 103*  BUN 26*  CREATININE 1.83*  CALCIUM 8.4   BNP: Pro B Natriuretic peptide (BNP)  Date/Time Value Ref Range Status  12/10/2013  6:10 PM 6010.0* 0 - 125 pg/mL Final  06/02/2013 12:32 PM 3125.0* 0 - 125 pg/mL Final   TELE:        Current Medications:  . buPROPion  75 mg Oral BID WC  . diltiazem  240 mg Oral Daily  . docusate sodium  100 mg Oral Daily  . metoprolol succinate  50 mg Oral BID  . pantoprazole  40 mg Oral Daily  . sodium bicarbonate  1,300 mg Oral BID  . tiotropium  18 mcg Inhalation Daily  . [START ON 12/15/2013] vancomycin  750 mg Intravenous Once  . warfarin  7.5 mg Oral Once per day on Sun Tue Wed Thu Sat   And  . warfarin  10 mg Oral Once per day on Mon Fri  . Warfarin - Physician Dosing Inpatient   Does not apply q1800   . dextrose 5 % and 0.45 % NaCl with KCl 20 mEq/L 75 mL/hr at 12/12/13 0400  . heparin 900 Units/hr (12/13/13 1814)    ASSESSMENT AND PLAN: Principal Problem:   PVD (peripheral vascular disease) - per [facility]  Active Problems:   Long-term (current)  use of anticoagulants - INR coming up, pt gets it checked at [facility] OP clinic, will arrange once d/c date known.   Non-ischemic cardiomyopathy - need daily weights, will ck echo   Atrial fibrillation - rate up at times but not sure BP will not tolerate dose change, follow.    Heel pain - consider wound consult     CKD - BUN/Cr in normal range for him  Signed, Rosaria Ferries , PA-C 7:56 AM 12/14/2013  I have examined the patient and reviewed assessment and plan and discussed with patient.  Agree with above as stated.  Stressed importance of anticoagulation.  Meztli Llanas S.

## 2013-12-14 NOTE — Progress Notes (Signed)
PT Cancellation Note  Patient Details Name: [name] MRN: [id number] DOB: 01/07/1946   Cancelled Treatment:    Reason Eval/Treat Not Completed: Patient declined, no reason specified.  PT reports he has, "made up my mind.  I am not getting up today!"  Despite persistence and education on the poor side effects of being in the bed and not mobilizing, he continued to refuse.  PT will continue to check back for mobility as pt reports he will be more willing once his fasciotomy incision is closed.    Thanks,     Barbarann Ehlers. [place], [place], DPT [phone]   12/14/2013, 3:09 PM

## 2013-12-14 NOTE — Progress Notes (Signed)
OT Cancellation Note  Patient Details Name: [name] MRN: [id number] DOB: [birth year]   Cancelled Treatment:    Reason Eval/Treat Not Completed: Pain limiting ability to participate - Pt continues to state that he will not get up until he undergoes surgery for closer of fasciotomy.  Explained risks and need to be OOB and increase activity, but he continues to decline.  Will continue attempts.   Darlina Rumpf [place], OTR/L [phone]  12/14/2013, 2:24 PM

## 2013-12-14 NOTE — Progress Notes (Signed)
Clinical Social Work Department CLINICAL SOCIAL WORK PLACEMENT NOTE 12/14/2013  Patient:  [name], [name]  Account Number:  [id number] Admit date:  12/10/2013  Clinical Social Worker:  Megan Salon  Date/time:  12/14/2013 11:47 AM  Clinical Social Work is seeking post-discharge placement for this patient at the following level of care:   [place]   (*CSW will update this form in Epic as items are completed)   12/14/2013  Patient/family provided with [place] Department of Clinical Social Work's list of facilities offering this level of care within the geographic area requested by the patient (or if unable, by the patient's family).  12/14/2013  Patient/family informed of their freedom to choose among providers that offer the needed level of care, that participate in Medicare, Medicaid or managed care program needed by the patient, have an available bed and are willing to accept the patient.  12/14/2013  Patient/family informed of [facility]' ownership interest in [facility], as well as of the fact that they are under no obligation to receive care at this facility.  PASARR submitted to EDS on 12/14/2013 PASARR number received on   FL2 transmitted to all facilities in geographic area requested by pt/family on  12/14/2013 FL2 transmitted to all facilities within larger geographic area on   Patient informed that his/her managed care company has contracts with or will negotiate with  certain facilities, including the following:     Patient/family informed of bed offers received:   Patient chooses bed at  Physician recommends and patient chooses bed at    Patient to be transferred to  on   Patient to be transferred to facility by  Patient and family notified of transfer on  Name of family member notified:    The following physician request were entered in Epic:   Additional Comments:  Jeanette Caprice, MSW, [place]

## 2013-12-14 NOTE — Progress Notes (Signed)
ANTICOAGULATION CONSULT NOTE - Follow Up Consult  Pharmacy Consult for heparin  Indication: s/p right femoral embolectomy and afib  Allergies  Allergen Reactions  . Digoxin And Related     Dig toxicity 2009  . Penicillins Rash    Patient Measurements: Height: 6\' 3"  (190.5 cm) Weight: 175 lb 0.7 oz (79.4 kg) IBW/kg (Calculated) : 84.5 Heparin Dosing Weight: 79.4 kg  Vital Signs: Temp: 98.2 F (36.8 C) (07/28 0410) Temp src: Oral (07/28 0410) BP: 116/85 mmHg (07/28 0410) Pulse Rate: 108 (07/28 0410)  Labs:  Recent Labs  12/12/13 0230 12/12/13 0819 12/12/13 1650 12/13/13 0547 12/14/13 0416  HGB 8.9*  --   --  10.2* 10.1*  HCT 27.3*  --   --  31.8* 31.5*  PLT 301  --   --  150 168  LABPROT  --  16.8*  --  18.0* 21.3*  INR  --  1.36  --  1.49 1.84*  HEPARINUNFRC  --  0.77* 0.58 0.42 0.31  CREATININE  --   --   --  1.83*  --     Estimated Creatinine Clearance: 42.8 ml/min (by C-G formula based on Cr of 1.83).   Medications:  Heparin @ 900 units/hr   Assessment: 68 yo male POD#4 s/p right femoral embolectomy and afib. Heparin level this morning remains therapeutic at 0.31units/mL, although that is on the low end of goal. Patient is also on warfarin per MD- INR 1.84. Warfarin dosing is 7.5mg  daily except 10mg  on Mondays and Fridays. Hgb stable at 10.1, plts 168 which is around patient's baseline. CBC from 7/26 is drastically different from patient's other draws during this admission, suspect lab error. No bleeding noted.  Goal of Therapy:  Heparin level 0.3-0.7 units/ml Monitor platelets by anticoagulation protocol: Yes   Plan:  1. Increase heparin to 950 units/hr since HL this morning was close to being subtherapeutic. 2. Daily HL and CBC 3. Follow closely for s/s bleeding 4. Will peripherally follow INR/warfarin  Jendayi Berling D. Jacquiline Zurcher, PharmD, BCPS Clinical Pharmacist Pager: [phone] 12/14/2013 9:15 AM

## 2013-12-14 NOTE — Progress Notes (Addendum)
  [facility] Progress Note  12/14/2013 7:25 AM 4 Days Post-Op  Subjective:  Patient complaining about left heel callus. Has been refusing PT due to pain in left heel.   Tmax 98.2 BP sys 100s-130s 02 98%  Filed Vitals:   12/14/13 0410  BP: 116/85  Pulse: 108  Temp: 98.2 F (36.8 C)  Resp: 18    Physical Exam: Incisions:  Groin incision c/d/i. No hematoma.  Extremities: Brisk right PT and DP doppler signals. Right foot is warm. Right lower leg dressing without any drainage seen.   CBC    Component Value Date/Time   WBC 11.9* 12/14/2013 0416   RBC 3.36* 12/14/2013 0416   RBC 4.61 12/22/2008 0515   HGB 10.1* 12/14/2013 0416   HCT 31.5* 12/14/2013 0416   PLT 168 12/14/2013 0416   MCV 93.8 12/14/2013 0416   MCH 30.1 12/14/2013 0416   MCHC 32.1 12/14/2013 0416   RDW 15.5 12/14/2013 0416   LYMPHSABS 4.0 11/30/2012 1513   MONOABS 0.9 11/30/2012 1513   EOSABS 0.4 11/30/2012 1513   BASOSABS 0.0 11/30/2012 1513    BMET    Component Value Date/Time   NA 143 12/13/2013 0547   K 4.9 12/13/2013 0547   CL 108 12/13/2013 0547   CO2 25 12/13/2013 0547   GLUCOSE 103* 12/13/2013 0547   BUN 26* 12/13/2013 0547   CREATININE 1.83* 12/13/2013 0547   CREATININE 1.72* 07/16/2013 1558   CALCIUM 8.4 12/13/2013 0547   GFRNONAA 36* 12/13/2013 0547   GFRNONAA 40* 07/16/2013 1558   GFRAA 42* 12/13/2013 0547   GFRAA 46* 07/16/2013 1558    INR    Component Value Date/Time   INR 1.84* 12/14/2013 0416   INR 1.7 10/05/2013 1541     Intake/Output Summary (Last 24 hours) at 12/14/13 0725 Last data filed at 12/14/13 8588  Gross per 24 hour  Intake    240 ml  Output    700 ml  Net   -460 ml     Assessment:  68 y.o. male is s/p:  Right Femoral Embolectomy; Fasciotomy Right Lower Leg with Intraoperative arteriogram (Right)  INTRA OPERATIVE ARTERIOGRAM (Right)  4 Days Post-Op  Plan: -Continuing to have good doppler signals of right foot.   -Continue dressing changes to fasciotomy  sites.  -Hgb stable.  -Will take to OR tomorrow for fasciotomy closure. -Encourage PT/OT -DVT prophylaxis:  Heparin and coumadin. INR today 1.84. Platelets increased today to 168.    Virgina Jock, PA-C [facility] Office: [phone] Pager: [phone] 12/14/2013 7:25 AM     I have examined the patient, reviewed and agree with above. Palpable dorsalis pedis pulse. For fasciotomy closure tomorrow.  Saydi Kobel, MD 12/14/2013 8:37 AM

## 2013-12-15 ENCOUNTER — Encounter (HOSPITAL_COMMUNITY)
Admission: EM | Disposition: A | Discharge status: Skilled Nursing Facility | Payer: Self-pay | Source: Home / Self Care | Attending: Vascular Surgery

## 2013-12-15 ENCOUNTER — Inpatient Hospital Stay (HOSPITAL_COMMUNITY): Payer: Medicare HMO | Admitting: Anesthesiology

## 2013-12-15 ENCOUNTER — Encounter (HOSPITAL_COMMUNITY): Payer: Medicare HMO | Admitting: Anesthesiology

## 2013-12-15 DIAGNOSIS — I743 Embolism and thrombosis of arteries of the lower extremities: Secondary | ICD-10-CM

## 2013-12-15 HISTORY — PX: FASCIOTOMY CLOSURE: SHX5829

## 2013-12-15 LAB — BASIC METABOLIC PANEL
Anion gap: 14 (ref 5–15)
BUN: 20 mg/dL (ref 6–23)
CO2: 24 meq/L (ref 19–32)
Calcium: 8.8 mg/dL (ref 8.4–10.5)
Chloride: 106 mEq/L (ref 96–112)
Creatinine, Ser: 1.82 mg/dL — ABNORMAL HIGH (ref 0.50–1.35)
GFR calc Af Amer: 42 mL/min — ABNORMAL LOW (ref >90)
GFR, EST NON AFRICAN AMERICAN: 36 mL/min — AB (ref >90)
GLUCOSE: 85 mg/dL (ref 70–99)
POTASSIUM: 4.5 meq/L (ref 3.7–5.3)
Sodium: 144 mEq/L (ref 137–147)

## 2013-12-15 LAB — CBC
HCT: 28 % — ABNORMAL LOW (ref 39.0–52.0)
HEMOGLOBIN: 9.1 g/dL — AB (ref 13.0–17.0)
MCH: 29.5 pg (ref 26.0–34.0)
MCHC: 32.5 g/dL (ref 30.0–36.0)
MCV: 90.9 fL (ref 78.0–100.0)
Platelets: 184 10*3/uL (ref 150–400)
RBC: 3.08 MIL/uL — AB (ref 4.22–5.81)
RDW: 15.4 % (ref 11.5–15.5)
WBC: 10.5 10*3/uL (ref 4.0–10.5)

## 2013-12-15 LAB — PROTIME-INR
INR: 1.89 — ABNORMAL HIGH (ref 0.00–1.49)
Prothrombin Time: 21.7 seconds — ABNORMAL HIGH (ref 11.6–15.2)

## 2013-12-15 LAB — HEPARIN LEVEL (UNFRACTIONATED): HEPARIN UNFRACTIONATED: 0.41 [IU]/mL (ref 0.30–0.70)

## 2013-12-15 SURGERY — FASCIOTOMY CLOSURE
Anesthesia: General | Site: Leg Lower | Laterality: Right

## 2013-12-15 MED ORDER — PROPOFOL 10 MG/ML IV BOLUS
INTRAVENOUS | Status: DC | PRN
  Administered 2013-12-15: 100 mg via INTRAVENOUS

## 2013-12-15 MED ORDER — LACTATED RINGERS IV SOLN
INTRAVENOUS | Status: DC
  Administered 2013-12-15: 10:00:00 via INTRAVENOUS

## 2013-12-15 MED ORDER — PHENYLEPHRINE HCL 10 MG/ML IJ SOLN
10.0000 mg | INTRAVENOUS | Status: DC | PRN
  Administered 2013-12-15: 50 ug/min via INTRAVENOUS

## 2013-12-15 MED ORDER — OXYCODONE HCL 5 MG PO TABS
ORAL_TABLET | ORAL | Status: AC
  Administered 2013-12-15: 5 mg via ORAL
  Filled 2013-12-15: qty 1

## 2013-12-15 MED ORDER — LACTATED RINGERS IV SOLN
INTRAVENOUS | Status: DC | PRN
  Administered 2013-12-15: 10:00:00 via INTRAVENOUS

## 2013-12-15 MED ORDER — FENTANYL CITRATE 0.05 MG/ML IJ SOLN
INTRAMUSCULAR | Status: DC | PRN
  Administered 2013-12-15: 50 ug via INTRAVENOUS
  Administered 2013-12-15: 100 ug via INTRAVENOUS
  Administered 2013-12-15: 50 ug via INTRAVENOUS

## 2013-12-15 MED ORDER — OXYCODONE HCL 5 MG/5ML PO SOLN: 5.0000 mg | Freq: Once | ORAL | Status: DC | PRN

## 2013-12-15 MED ORDER — GLYCOPYRROLATE 0.2 MG/ML IJ SOLN
INTRAMUSCULAR | Status: DC | PRN
  Administered 2013-12-15: 0.4 mg via INTRAVENOUS

## 2013-12-15 MED ORDER — ONDANSETRON HCL 4 MG/2ML IJ SOLN
INTRAMUSCULAR | Status: DC | PRN
  Administered 2013-12-15: 4 mg via INTRAVENOUS

## 2013-12-15 MED ORDER — PHENYLEPHRINE HCL 10 MG/ML IJ SOLN
INTRAMUSCULAR | Status: DC | PRN
  Administered 2013-12-15: 120 ug via INTRAVENOUS
  Administered 2013-12-15 (×2): 80 ug via INTRAVENOUS

## 2013-12-15 MED ORDER — OXYCODONE-ACETAMINOPHEN 5-325 MG PO TABS
ORAL_TABLET | ORAL | Status: AC
  Administered 2013-12-15: 1 via ORAL
  Filled 2013-12-15: qty 1

## 2013-12-15 MED ORDER — LIDOCAINE HCL (CARDIAC) 20 MG/ML IV SOLN
INTRAVENOUS | Status: AC
  Filled 2013-12-15: qty 5

## 2013-12-15 MED ORDER — ROCURONIUM BROMIDE 100 MG/10ML IV SOLN
INTRAVENOUS | Status: DC | PRN
  Administered 2013-12-15: 30 mg via INTRAVENOUS

## 2013-12-15 MED ORDER — OXYCODONE HCL 5 MG PO TABS: 5.0000 mg | ORAL_TABLET | Freq: Once | ORAL | Status: DC | PRN

## 2013-12-15 MED ORDER — ROCURONIUM BROMIDE 50 MG/5ML IV SOLN
INTRAVENOUS | Status: AC
  Filled 2013-12-15: qty 1

## 2013-12-15 MED ORDER — GLYCOPYRROLATE 0.2 MG/ML IJ SOLN
INTRAMUSCULAR | Status: AC
  Filled 2013-12-15: qty 1

## 2013-12-15 MED ORDER — PROMETHAZINE HCL 25 MG/ML IJ SOLN: 6.2500 mg | INTRAMUSCULAR | Status: DC | PRN

## 2013-12-15 MED ORDER — PROPOFOL 10 MG/ML IV BOLUS
INTRAVENOUS | Status: AC
  Filled 2013-12-15: qty 20

## 2013-12-15 MED ORDER — LIDOCAINE HCL (CARDIAC) 20 MG/ML IV SOLN
INTRAVENOUS | Status: DC | PRN
  Administered 2013-12-15: 30 mg via INTRAVENOUS

## 2013-12-15 MED ORDER — MEPERIDINE HCL 25 MG/ML IJ SOLN: 6.2500 mg | INTRAMUSCULAR | Status: DC | PRN

## 2013-12-15 MED ORDER — HYDROMORPHONE HCL PF 1 MG/ML IJ SOLN
0.2500 mg | INTRAMUSCULAR | Status: DC | PRN
  Administered 2013-12-15 (×4): 0.5 mg via INTRAVENOUS

## 2013-12-15 MED ORDER — FENTANYL CITRATE 0.05 MG/ML IJ SOLN
INTRAMUSCULAR | Status: AC
  Filled 2013-12-15: qty 5

## 2013-12-15 MED ORDER — MIDAZOLAM HCL 2 MG/2ML IJ SOLN
INTRAMUSCULAR | Status: AC
  Filled 2013-12-15: qty 2

## 2013-12-15 MED ORDER — ONDANSETRON HCL 4 MG/2ML IJ SOLN
INTRAMUSCULAR | Status: AC
  Filled 2013-12-15: qty 2

## 2013-12-15 MED ORDER — 0.9 % SODIUM CHLORIDE (POUR BTL) OPTIME
TOPICAL | Status: DC | PRN
  Administered 2013-12-15: 1000 mL

## 2013-12-15 MED ORDER — PHENYLEPHRINE 40 MCG/ML (10ML) SYRINGE FOR IV PUSH (FOR BLOOD PRESSURE SUPPORT)
PREFILLED_SYRINGE | INTRAVENOUS | Status: AC
  Filled 2013-12-15: qty 10

## 2013-12-15 MED ORDER — WARFARIN - PHARMACIST DOSING INPATIENT
Freq: Every day | Status: DC
  Administered 2013-12-15: 18:00:00

## 2013-12-15 MED ORDER — SUCCINYLCHOLINE CHLORIDE 20 MG/ML IJ SOLN
INTRAMUSCULAR | Status: AC
  Filled 2013-12-15: qty 1

## 2013-12-15 MED ORDER — HYDROMORPHONE HCL PF 1 MG/ML IJ SOLN
INTRAMUSCULAR | Status: AC
  Administered 2013-12-15: 0.5 mg via INTRAVENOUS
  Filled 2013-12-15: qty 2

## 2013-12-15 MED ORDER — NEOSTIGMINE METHYLSULFATE 10 MG/10ML IV SOLN
INTRAVENOUS | Status: DC | PRN
  Administered 2013-12-15: 3 mg via INTRAVENOUS

## 2013-12-15 SURGICAL SUPPLY — 39 items
BANDAGE ELASTIC 4 VELCRO ST LF (GAUZE/BANDAGES/DRESSINGS) IMPLANT
BANDAGE ELASTIC 6 VELCRO ST LF (GAUZE/BANDAGES/DRESSINGS) ×3 IMPLANT
BANDAGE GAUZE ELAST BULKY 4 IN (GAUZE/BANDAGES/DRESSINGS) IMPLANT
BLADE 10 SAFETY STRL DISP (BLADE) ×3 IMPLANT
BNDG GAUZE ELAST 4 BULKY (GAUZE/BANDAGES/DRESSINGS) ×3 IMPLANT
CANISTER SUCTION 2500CC (MISCELLANEOUS) ×3 IMPLANT
CLIP LIGATING EXTRA MED SLVR (CLIP) ×3 IMPLANT
CLIP LIGATING EXTRA SM BLUE (MISCELLANEOUS) ×3 IMPLANT
COVER SURGICAL LIGHT HANDLE (MISCELLANEOUS) ×3 IMPLANT
DRAPE U-SHAPE 47X51 STRL (DRAPES) IMPLANT
DRAPE U-SHAPE 76X120 STRL (DRAPES) IMPLANT
ELECT REM PT RETURN 9FT ADLT (ELECTROSURGICAL) ×3
ELECTRODE REM PT RTRN 9FT ADLT (ELECTROSURGICAL) ×1 IMPLANT
GLOVE BIO SURGEON STRL SZ7.5 (GLOVE) ×3 IMPLANT
GLOVE BIOGEL PI IND STRL 6.5 (GLOVE) ×1 IMPLANT
GLOVE BIOGEL PI IND STRL 8 (GLOVE) ×2 IMPLANT
GLOVE BIOGEL PI INDICATOR 6.5 (GLOVE) ×2
GLOVE BIOGEL PI INDICATOR 8 (GLOVE) ×4
GLOVE SS BIOGEL STRL SZ 7.5 (GLOVE) ×1 IMPLANT
GLOVE SUPERSENSE BIOGEL SZ 7.5 (GLOVE) ×2
GOWN STRL REUS W/ TWL LRG LVL3 (GOWN DISPOSABLE) ×3 IMPLANT
GOWN STRL REUS W/TWL LRG LVL3 (GOWN DISPOSABLE) ×6
KIT BASIN OR (CUSTOM PROCEDURE TRAY) ×3 IMPLANT
KIT ROOM TURNOVER OR (KITS) ×3 IMPLANT
NS IRRIG 1000ML POUR BTL (IV SOLUTION) ×3 IMPLANT
PACK GENERAL/GYN (CUSTOM PROCEDURE TRAY) ×3 IMPLANT
PACK UNIVERSAL I (CUSTOM PROCEDURE TRAY) ×3 IMPLANT
PAD ARMBOARD 7.5X6 YLW CONV (MISCELLANEOUS) ×6 IMPLANT
SPONGE GAUZE 4X4 12PLY (GAUZE/BANDAGES/DRESSINGS) ×3 IMPLANT
SPONGE GAUZE 4X4 12PLY STER LF (GAUZE/BANDAGES/DRESSINGS) ×3 IMPLANT
STAPLER VISISTAT 35W (STAPLE) IMPLANT
SUT ETHILON 2 0 PSLX (SUTURE) ×9 IMPLANT
SUT ETHILON 3 0 PS 1 (SUTURE) IMPLANT
SUT VIC AB 2-0 CTX 36 (SUTURE) ×3 IMPLANT
SUT VIC AB 3-0 SH 27 (SUTURE) ×2
SUT VIC AB 3-0 SH 27X BRD (SUTURE) ×1 IMPLANT
TOWEL OR 17X24 6PK STRL BLUE (TOWEL DISPOSABLE) ×3 IMPLANT
TOWEL OR 17X26 10 PK STRL BLUE (TOWEL DISPOSABLE) ×3 IMPLANT
WATER STERILE IRR 1000ML POUR (IV SOLUTION) ×3 IMPLANT

## 2013-12-15 NOTE — H&P (View-Only) (Signed)
  [facility] Progress Note  12/14/2013 7:25 AM 4 Days Post-Op  Subjective:  Patient complaining about left heel callus. Has been refusing PT due to pain in left heel.   Tmax 98.2 BP sys 100s-130s 02 98%  Filed Vitals:   12/14/13 0410  BP: 116/85  Pulse: 108  Temp: 98.2 F (36.8 C)  Resp: 18    Physical Exam: Incisions:  Groin incision c/d/i. No hematoma.  Extremities: Brisk right PT and DP doppler signals. Right foot is warm. Right lower leg dressing without any drainage seen.   CBC    Component Value Date/Time   WBC 11.9* 12/14/2013 0416   RBC 3.36* 12/14/2013 0416   RBC 4.61 12/22/2008 0515   HGB 10.1* 12/14/2013 0416   HCT 31.5* 12/14/2013 0416   PLT 168 12/14/2013 0416   MCV 93.8 12/14/2013 0416   MCH 30.1 12/14/2013 0416   MCHC 32.1 12/14/2013 0416   RDW 15.5 12/14/2013 0416   LYMPHSABS 4.0 11/30/2012 1513   MONOABS 0.9 11/30/2012 1513   EOSABS 0.4 11/30/2012 1513   BASOSABS 0.0 11/30/2012 1513    BMET    Component Value Date/Time   NA 143 12/13/2013 0547   K 4.9 12/13/2013 0547   CL 108 12/13/2013 0547   CO2 25 12/13/2013 0547   GLUCOSE 103* 12/13/2013 0547   BUN 26* 12/13/2013 0547   CREATININE 1.83* 12/13/2013 0547   CREATININE 1.72* 07/16/2013 1558   CALCIUM 8.4 12/13/2013 0547   GFRNONAA 36* 12/13/2013 0547   GFRNONAA 40* 07/16/2013 1558   GFRAA 42* 12/13/2013 0547   GFRAA 46* 07/16/2013 1558    INR    Component Value Date/Time   INR 1.84* 12/14/2013 0416   INR 1.7 10/05/2013 1541     Intake/Output Summary (Last 24 hours) at 12/14/13 0725 Last data filed at 12/14/13 0093  Gross per 24 hour  Intake    240 ml  Output    700 ml  Net   -460 ml     Assessment:  68 y.o. male is s/p:  Right Femoral Embolectomy; Fasciotomy Right Lower Leg with Intraoperative arteriogram (Right)  INTRA OPERATIVE ARTERIOGRAM (Right)  4 Days Post-Op  Plan: -Continuing to have good doppler signals of right foot.   -Continue dressing changes to fasciotomy  sites.  -Hgb stable.  -Will take to OR tomorrow for fasciotomy closure. -Encourage PT/OT -DVT prophylaxis:  Heparin and coumadin. INR today 1.84. Platelets increased today to 168.    Virgina Jock, PA-C [facility] Office: [phone] Pager: [phone] 12/14/2013 7:25 AM     I have examined the patient, reviewed and agree with above. Palpable dorsalis pedis pulse. For fasciotomy closure tomorrow.  EARLY, TODD, MD 12/14/2013 8:37 AM

## 2013-12-15 NOTE — Interval H&P Note (Signed)
History and Physical Interval Note:  12/15/2013 10:03 AM  [name] Reason  has presented today for surgery, with the diagnosis of Other disorder of muscle, ligament, and fascia   The various methods of treatment have been discussed with the patient and family. After consideration of risks, benefits and other options for treatment, the patient has consented to  Procedure(s): LEG FASCIOTOMY CLOSURE (Right) as a surgical intervention .  The patient's history has been reviewed, patient examined, no change in status, stable for surgery.  I have reviewed the patient's chart and labs.  Questions were answered to the patient's satisfaction.     [name]

## 2013-12-15 NOTE — Progress Notes (Addendum)
ANTICOAGULATION CONSULT NOTE - Follow Up Consult  Pharmacy Consult for heparin  Indication: s/p right femoral embolectomy and afib  Allergies  Allergen Reactions  . Digoxin And Related     Dig toxicity 2009  . Penicillins Rash    Patient Measurements: Height: 6\' 3"  (190.5 cm) Weight: 175 lb 0.7 oz (79.4 kg) IBW/kg (Calculated) : 84.5 Heparin Dosing Weight: 79.4 kg  Vital Signs: Temp: 98.7 F (37.1 C) (07/29 0555) Temp src: Oral (07/29 0555) BP: 113/88 mmHg (07/29 0555) Pulse Rate: 91 (07/29 0555)  Labs:  Recent Labs  12/13/13 0547 12/14/13 0416 12/15/13 0405 12/15/13 0700  HGB 10.2* 10.1* 9.1*  --   HCT 31.8* 31.5* 28.0*  --   PLT 150 168 184  --   LABPROT 18.0* 21.3*  --  21.7*  INR 1.49 1.84*  --  1.89*  HEPARINUNFRC 0.42 0.31  --  0.41  CREATININE 1.83*  --  1.82*  --     Estimated Creatinine Clearance: 43 ml/min (by C-G formula based on Cr of 1.82).   Medications:  Heparin @ 950 units/hr   Assessment: 68 yo male s/p right femoral embolectomy and also with afib. Heparin level this morning remains therapeutic at 0.41units/mL after a slight increase yesterday to heparin drip rate. Patient is also on warfarin per MD- INR 1.89. Warfarin dosing is 7.5mg  daily except 10mg  on Mondays and Fridays. Hgb dropped a bit to 9.1, plts 184 which is around patient's baseline. No bleeding noted.  Goal of Therapy:  Heparin level 0.3-0.7 units/ml Monitor platelets by anticoagulation protocol: Yes   Plan:  1. Continue heparin at 950 units/hr  2. Daily HL and CBC 3. Follow closely for s/s bleeding 4. Will peripherally follow INR/warfarin   Lauren D. Bajbus, PharmD, BCPS Clinical Pharmacist Pager: [phone] 12/15/2013 8:59 AM   2:52 PM Daily INR in place, trending up on home dose. now post faciotomy closure.  7.5 mg dose tonight.  Will continue and check INR daily.  [place]

## 2013-12-15 NOTE — Op Note (Signed)
    OPERATIVE REPORT  DATE OF SURGERY: 12/15/2013  PATIENT: [name], 68 y.o. male MRN: [id number]  DOB: [birth year]  PRE-OPERATIVE DIAGNOSIS: Open fasciotomies right calf  POST-OPERATIVE DIAGNOSIS:  Same  PROCEDURE: Irrigation debridement and closure of medial and lateral fasciotomies  SURGEON:  Curt Jews, M.D.  PHYSICIAN ASSISTANT: Nurse  ANESTHESIA:  Gen.  EBL: Minimal ml  Total I/O In: 9.5 [I.V.:9.5] Out: 200 [Urine:200]  BLOOD ADMINISTERED: None  DRAINS: None  SPECIMEN: None  COUNTS CORRECT:  YES  PLAN OF CARE: PACU   PATIENT DISPOSITION:  PACU - hemodynamically stable  PROCEDURE DETAILS: Patient was taken to the operating placed supine position where the area of the right medial lateral calf were prepped and draped in usual sterile fashion. The medial and lateral calf fasciotomy sites were irrigated with saline. There was no evidence of nonviable muscle and no evidence of. After irrigating the wound the medial calf incision was closed with 2-0 nylon mattress sutures. Attention was then turned to the right lateral. There was some tension on the closure of this. Again 2-0 nylon mattress sutures were used to approximate the edges. Approximately 3-4 mm of open skin was left to not put too much tension on the closure. This was packed with saline soaked 4 x 4 in the Kerlix and Ace wrap were applied over this. The patient was transferred to the recovery room in stable condition   Curt Jews, M.D. 12/15/2013 11:46 AM

## 2013-12-15 NOTE — Progress Notes (Signed)
Spoke with dr Glennon Mac regarding pts hr ranging from 110-145 ordeers obtained and will carry out will give am helt [id number]  AND dilacor po

## 2013-12-15 NOTE — Anesthesia Preprocedure Evaluation (Addendum)
Anesthesia Evaluation  Patient identified by MRN, date of birth, ID band Patient awake    Reviewed: Allergy & Precautions, H&P , NPO status , Patient's Chart, lab work & pertinent test results, reviewed documented beta blocker date and time   History of Anesthesia Complications (+) history of anesthetic complications  Airway Mallampati: I TM Distance: >3 FB Neck ROM: Full    Dental  (+) Edentulous Upper, Edentulous Lower   Pulmonary COPD COPD inhaler, former smoker (quit 1/15),  breath sounds clear to auscultation  Pulmonary exam normal       Cardiovascular hypertension, Pt. on medications and Pt. 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, for tachy-brady) Rhythm:Regular Rate:Normal  7/14 ECHO: EF 25-30% 1/15 TEE: EF 45-50%, valves OK   Neuro/Psych negative neurological ROS     GI/Hepatic Neg liver ROS, GERD-  Medicated and Controlled,  Endo/Other  negative endocrine ROS  Renal/GU Renal InsufficiencyRenal disease     Musculoskeletal   Abdominal   Peds  Hematology  (+) Blood dyscrasia (coumadin, INR 1.84, Hb 9.1), anemia ,   Anesthesia Other Findings   Reproductive/Obstetrics                        Anesthesia Physical Anesthesia Plan  ASA: III  Anesthesia Plan: General   Post-op Pain Management:    Induction: Intravenous  Airway Management Planned: LMA  Additional Equipment:   Intra-op Plan:   Post-operative Plan:   Informed Consent: I have reviewed the patients History and Physical, chart, labs and discussed the procedure including the risks, benefits and alternatives for the proposed anesthesia with the patient or authorized representative who has indicated his/her understanding and acceptance.   Dental advisory given  Plan Discussed with: CRNA and Surgeon  Anesthesia Plan Comments: (Plan routine monitors, GA-  LMA OK)        Anesthesia Quick Evaluation

## 2013-12-15 NOTE — Progress Notes (Signed)
Metoprolol & diltiazem given as ordered p.o.

## 2013-12-15 NOTE — Anesthesia Postprocedure Evaluation (Signed)
  Anesthesia Post-op Note  Patient: [name]  Procedure(s) Performed: Procedure(s): LEG FASCIOTOMY CLOSURE (Right)  Patient Location: PACU  Anesthesia Type:General  Level of Consciousness: awake, alert , oriented and patient cooperative  Airway and Oxygen Therapy: Patient Spontanous Breathing and Patient connected to nasal cannula oxygen  Post-op Pain: none  Post-op Assessment: Post-op Vital signs reviewed, Patient's Cardiovascular Status Stable, Respiratory Function Stable, Patent Airway, No signs of Nausea or vomiting and Pain level controlled  Post-op Vital Signs: Reviewed and stable  Last Vitals:  Filed Vitals:   12/15/13 1330  BP: 126/86  Pulse: 118  Temp:   Resp: 24    Complications: No apparent anesthesia complications

## 2013-12-15 NOTE — Progress Notes (Signed)
       Patient Profile: 68 yo male w/ perm afib, noncompliant w/ coum, CKD, was admitted 07/24 w/ SOB, ischemic RLE, embolectomy 07/24. Cards following for afib  Subjective: Pt in OR  Objective: Vital signs in last 24 hours: Temp:  [97.3 F (36.3 C)-98.9 F (37.2 C)] 98.7 F (37.1 C) (07/29 0555) Pulse Rate:  [78-109] 91 (07/29 0555) Resp:  [18] 18 (07/29 0555) BP: (113-135)/(77-88) 113/88 mmHg (07/29 0555) SpO2:  [96 %-99 %] 98 % (07/29 0841) Weight change:  Last BM Date: 12/08/13 Intake/Output from previous day: -254  07/28 0701 - 07/29 0700 In: 1305.9 [P.O.:1080; I.V.:225.9] Out: 1250 [Urine:1250] Intake/Output this shift: Total I/O In: 9.5 [I.V.:9.5] Out: 200 [Urine:200]  PE: Pt in OR unable to examine  Lab Results:  Recent Labs  12/14/13 0416 12/15/13 0405  WBC 11.9* 10.5  HGB 10.1* 9.1*  HCT 31.5* 28.0*  PLT 168 184   BMET  Recent Labs  12/13/13 0547 12/15/13 0405  NA 143 144  K 4.9 4.5  CL 108 106  CO2 25 24  GLUCOSE 103* 85  BUN 26* 20  CREATININE 1.83* 1.82*  CALCIUM 8.4 8.8   No results found for this basename: TROPONINI, CK, MB,  in the last 72 hours       Studies/Results: No results found.  Medications: I have reviewed the patient's current medications. Scheduled Meds: . buPROPion  75 mg Oral BID WC  . diltiazem  240 mg Oral Daily  . docusate sodium  100 mg Oral Daily  . metoprolol succinate  50 mg Oral BID  . pantoprazole  40 mg Oral Daily  . sodium bicarbonate  1,300 mg Oral BID  . tiotropium  18 mcg Inhalation Daily  . warfarin  7.5 mg Oral Once per day on Sun Tue Wed Thu Sat   And  . warfarin  10 mg Oral Once per day on Mon Fri  . Warfarin - Physician Dosing Inpatient   Does not apply q1800   Continuous Infusions: . dextrose 5 % and 0.45 % NaCl with KCl 20 mEq/L 10 mL/hr (12/15/13 0416)  . heparin Stopped (12/15/13 0900)   PRN Meds:.acetaminophen, acetaminophen, albuterol, alum & mag hydroxide-simeth,  guaiFENesin-dextromethorphan, hydrALAZINE, labetalol, metoprolol, morphine injection, ondansetron, oxyCODONE, oxyCODONE-acetaminophen, phenol  Assessment/Plan: Principal Problem:   PVD (peripheral vascular disease)-POD #5 rt femoral embolectomy, medial and lateral lower extremity fasciotomies in calf. Active Problems:   Long-term (current) use of anticoagulants- INR now 1.89 slow drift up Heparin/coumadin (sub therapeutic on admit) crossover    Non-ischemic cardiomyopathy   Permanent  Atrial fibrillation   Anemia on admit 14/43 now 9.1/28 secondary to surgery   LOS: 5 days   Time spent with pt. :15 minutes. [facility]  Nurse Practitioner Certified Pager [phone] or after 5pm and on weekends call [phone] 12/15/2013, 9:18 AM   I have reviewed assessment and plan.  Agree with above as stated.  Rate control has been borderline.  May be worsened by anemia.  Will follow.  [name] S.

## 2013-12-15 NOTE — Transfer of Care (Signed)
Immediate Anesthesia Transfer of Care Note  Patient: [name]  Procedure(s) Performed: Procedure(s): LEG FASCIOTOMY CLOSURE (Right)  Patient Location: PACU  Anesthesia Type:General  Level of Consciousness: awake, alert  and oriented  Airway & Oxygen Therapy: Patient Spontanous Breathing and Patient connected to nasal cannula oxygen  Post-op Assessment: Report given to PACU RN, Post -op Vital signs reviewed and stable and Patient moving all extremities  Post vital signs: Reviewed and stable  Complications: No apparent anesthesia complications

## 2013-12-15 NOTE — Anesthesia Procedure Notes (Signed)
Procedure Name: Intubation Date/Time: 12/15/2013 10:34 AM Performed by: Trixie Deis A Pre-anesthesia Checklist: Patient identified, Emergency Drugs available, Suction available, Patient being monitored and Timeout performed Patient Re-evaluated:Patient Re-evaluated prior to inductionOxygen Delivery Method: Circle system utilized Preoxygenation: Pre-oxygenation with 100% oxygen Intubation Type: IV induction Ventilation: Mask ventilation without difficulty Laryngoscope Size: Mac and 4 Grade View: Grade I Tube type: Oral Tube size: 7.5 mm Number of attempts: 1 Airway Equipment and Method: Stylet Secured at: 23 cm Tube secured with: Tape Dental Injury: Teeth and Oropharynx as per pre-operative assessment

## 2013-12-16 ENCOUNTER — Encounter (HOSPITAL_COMMUNITY): Payer: Self-pay | Admitting: Vascular Surgery

## 2013-12-16 DIAGNOSIS — I369 Nonrheumatic tricuspid valve disorder, unspecified: Secondary | ICD-10-CM

## 2013-12-16 LAB — CBC
HEMATOCRIT: 27 % — AB (ref 39.0–52.0)
Hemoglobin: 8.8 g/dL — ABNORMAL LOW (ref 13.0–17.0)
MCH: 29.8 pg (ref 26.0–34.0)
MCHC: 32.6 g/dL (ref 30.0–36.0)
MCV: 91.5 fL (ref 78.0–100.0)
PLATELETS: 176 10*3/uL (ref 150–400)
RBC: 2.95 MIL/uL — AB (ref 4.22–5.81)
RDW: 15.4 % (ref 11.5–15.5)
WBC: 12.3 10*3/uL — AB (ref 4.0–10.5)

## 2013-12-16 LAB — PROTIME-INR
INR: 2.35 — AB (ref 0.00–1.49)
Prothrombin Time: 25.7 seconds — ABNORMAL HIGH (ref 11.6–15.2)

## 2013-12-16 LAB — HEPARIN LEVEL (UNFRACTIONATED)
HEPARIN UNFRACTIONATED: 0.17 [IU]/mL — AB (ref 0.30–0.70)
Heparin Unfractionated: 0.13 IU/mL — ABNORMAL LOW (ref 0.30–0.70)

## 2013-12-16 MED ORDER — HEPARIN (PORCINE) IN NACL 100-0.45 UNIT/ML-% IJ SOLN
1050.0000 [IU]/h | INTRAMUSCULAR | Status: DC
  Administered 2013-12-16: 950 [IU]/h via INTRAVENOUS
  Filled 2013-12-16 (×2): qty 250

## 2013-12-16 NOTE — Progress Notes (Addendum)
  [facility] Progress Note  12/16/2013 7:44 AM 1 Day Post-Op  Subjective:  Complaining of numbness in right leg. Denies chest pain or shortness of breath.   T max 100.6 BP sys 90s-140s Pulse 114 AFib 02 100% RA  Filed Vitals:   12/16/13 0412  BP: 94/55  Pulse: 114  Temp: 99.2 F (37.3 C)  Resp: 17    Physical Exam: Resting in bed in NAD Incisions:  Right groin incisions c/d/i; Lower fasciotomies clean with bloody drainage on dressing.  Extremities: Right DP doppler signal. 2+ pedal edema.  Cardiac: irregularly irregular   CBC    Component Value Date/Time   WBC 10.5 12/15/2013 0405   RBC 3.08* 12/15/2013 0405   RBC 4.61 12/22/2008 0515   HGB 9.1* 12/15/2013 0405   HCT 28.0* 12/15/2013 0405   PLT 184 12/15/2013 0405   MCV 90.9 12/15/2013 0405   MCH 29.5 12/15/2013 0405   MCHC 32.5 12/15/2013 0405   RDW 15.4 12/15/2013 0405   LYMPHSABS 4.0 11/30/2012 1513   MONOABS 0.9 11/30/2012 1513   EOSABS 0.4 11/30/2012 1513   BASOSABS 0.0 11/30/2012 1513    BMET    Component Value Date/Time   NA 144 12/15/2013 0405   K 4.5 12/15/2013 0405   CL 106 12/15/2013 0405   CO2 24 12/15/2013 0405   GLUCOSE 85 12/15/2013 0405   BUN 20 12/15/2013 0405   CREATININE 1.82* 12/15/2013 0405   CREATININE 1.72* 07/16/2013 1558   CALCIUM 8.8 12/15/2013 0405   GFRNONAA 36* 12/15/2013 0405   GFRNONAA 40* 07/16/2013 1558   GFRAA 42* 12/15/2013 0405   GFRAA 46* 07/16/2013 1558    INR    Component Value Date/Time   INR 1.89* 12/15/2013 0700   INR 1.7 10/05/2013 1541     Intake/Output Summary (Last 24 hours) at 12/16/13 0744 Last data filed at 12/15/13 2136  Gross per 24 hour  Intake    9.5 ml  Output    750 ml  Net -740.5 ml     Assessment:  68 y.o. male is s/p:  Right Femoral Embolectomy; Fasciotomy Right Lower Leg with Intraoperative arteriogram (Right)  INTRA OPERATIVE ARTERIOGRAM (Right) 6 Days Post-Op  Irrigation and debridement and closure of medial and lateral  fasciotomies 1 Day Post-Op  Plan: -Continuing to have good doppler signal to right foot. -Fasciotomy closures healing well -Encourage ambulation. -Atrial fibrillation: HR 110s, on diltiazem and metoprolol. Will put back on telemetry and get EKG.  -DVT prophylaxis:  Heparin, coumadin INR 1.89   Virgina Jock, PA-C [facility] Office: [phone] Pager: [phone] 12/16/2013 7:44 AM     I have examined the patient, reviewed and agree with above. 2+ her cells pedis pulse on the right. I will begin dressing changes to the right lateral fasciotomy closure area. There were several millimeters it were still open which will) quickly. Patient can be discharged to nursing facility once Coumadin is therapeutic. Discuss this with the patient. I will be out of town for several days and Dr. Oneida Alar will see him over the weekend.  Iria Jamerson, MD 12/16/2013 10:29 AM

## 2013-12-16 NOTE — Evaluation (Signed)
Occupational Therapy Evaluation Patient Details Name: [name] MRN: [id number] DOB: 1946/02/12 Today's Date: 12/16/2013    History of Present Illness This 68 y.o. male amditted with SOB as well as pain and numbness Rt. foot.  He was found to have severe ischemia Rt. LE.  He underwent Rt femoral embolectomy as well as medial and lateral LE fasciotomies in the calf 12/10/13 with I &D with closure on 12/15/13.  PMH includes COPD; bipolar disorder; h/o DVT; s/p bil. THA; s/p pacemaker placement   Clinical Impression   Pt admitted with above. He demonstrates the below listed deficits and will benefit from continued OT to maximize safety and independence with BADLs.  Pt presents to OT with pain, generalized weakness, and decreased balance.  Pt currently requires mod - max A for LB ADLs.  Recommend SNF for rehab      Follow Up Recommendations  SNF    Equipment Recommendations  None recommended by OT    Recommendations for Other Services       Precautions / Restrictions Precautions Precautions: Fall Precaution Comments: Pt with difficulty WBing on Rt. LE      Mobility Bed Mobility Overal bed mobility: Needs Assistance Bed Mobility: Supine to Sit     Supine to sit: Supervision        Transfers Overall transfer level: Needs assistance Equipment used: Rolling walker (2 wheeled) Transfers: Sit to/from [facility] Sit to Stand: Min assist;+2 physical assistance Stand pivot transfers: Min assist;+2 safety/equipment Squat pivot transfers: Min assist;+2 safety/equipment     General transfer comment: Pt requires assist to move into standing.  Pt unable to weigh bear over Rt. LE.  Pt tends to lean anteriorly placing him at risk for loss of balance.  needs cues and assist to correct posture    Balance Overall balance assessment: Needs assistance Sitting-balance support: Feet supported Sitting balance-Leahy Scale: Normal     Standing balance support: Bilateral  upper extremity supported Standing balance-Leahy Scale: Poor                              ADL Overall ADL's : Needs assistance/impaired Eating/Feeding: Independent;Sitting   Grooming: Wash/dry hands;Wash/dry face;Oral care;Brushing hair;Set up;Sitting   Upper Body Bathing: Set up;Sitting   Lower Body Bathing: Sit to/from stand;Moderate assistance   Upper Body Dressing : Set up;Sitting   Lower Body Dressing: Maximal assistance;Sit to/from stand   Toilet Transfer: Minimal assistance;+2 for safety/equipment;Ambulation;Cueing for safety;BSC   Toileting- Clothing Manipulation and Hygiene: Maximal assistance;Sit to/from stand       Functional mobility during ADLs: Minimal assistance;+2 for safety/equipment;Rolling walker General ADL Comments: Pt unable to access Rt. LE for LB ADLs due to edema and pain.  Requires bil. UE support while standing and unable to pull pants over hips or wash peri area     Vision                     Perception     Praxis      Pertinent Vitals/Pain See vitals flow sheet.      Hand Dominance Right   Extremity/Trunk Assessment Upper Extremity Assessment Upper Extremity Assessment: Overall WFL for tasks assessed   Lower Extremity Assessment Lower Extremity Assessment: Defer to PT evaluation   Cervical / Trunk Assessment Cervical / Trunk Assessment: Normal   Communication Communication Communication: No difficulties   Cognition Arousal/Alertness: Awake/alert Behavior During Therapy: WFL for tasks assessed/performed Overall Cognitive Status:  Within Functional Limits for tasks assessed (Pt with poor judgement, but that appears to be his baseline)                     General Comments       Exercises       Shoulder Instructions      Home Living Family/patient expects to be discharged to:: Skilled nursing facility                                        Prior Functioning/Environment Level of  Independence: Independent with assistive device(s)        Comments: taxi driver    OT Diagnosis: Generalized weakness;Acute pain   OT Problem List: Decreased strength;Decreased activity tolerance;Impaired balance (sitting and/or standing);Decreased knowledge of use of DME or AE;Decreased safety awareness;Pain   OT Treatment/Interventions: Self-care/ADL training;DME and/or AE instruction;Therapeutic activities;Patient/family education;Balance training    OT Goals(Current goals can be found in the care plan section) Acute Rehab OT Goals Patient Stated Goal: to get better OT Goal Formulation: With patient Time For Goal Achievement: 12/23/13 Potential to Achieve Goals: Good ADL Goals Pt Will Perform Grooming: standing;with supervision Pt Will Perform Lower Body Bathing: with supervision;sit to/from stand Pt Will Perform Lower Body Dressing: with supervision;sit to/from stand Pt Will Transfer to Toilet: with supervision;ambulating;regular height toilet;bedside commode;grab bars Pt Will Perform Toileting - Clothing Manipulation and hygiene: with supervision;sit to/from stand  OT Frequency: Min 2X/week   Barriers to D/C: Decreased caregiver support          Co-evaluation PT/OT/SLP Co-Evaluation/Treatment: Yes Reason for Co-Treatment: For patient/therapist safety   OT goals addressed during session: ADL's and self-care      End of Session Equipment Utilized During Treatment: Rolling walker Nurse Communication: Mobility status  Activity Tolerance: Patient limited by pain Patient left: in chair;with call bell/phone within reach   Time: 1829-9371 OT Time Calculation (min): 14 min Charges:  OT General Charges $OT Visit: 1 Procedure OT Evaluation $Initial OT Evaluation Tier I: 1 Procedure G-Codes:    Lucille Passy M 12/31/13, 3:36 PM

## 2013-12-16 NOTE — Clinical Social Work Note (Signed)
CSW met with patient to offer SNF bed offers and for his selection- he has asked me to call his daughte to have her help in SNF selection-  CSW called daughter- Davy Pique but unable to reach via cell (no voicemail set up). Will attempt again later today- Patient upset about his "heart healthy" diet- stating, "I've been eating a regular diet for 70 years and it hadn't killed me yet". Educated patient on the probable reason MD ordered the heart healthy diet but he is pretty adamant- RN advised who is already aware and notifying MD for input.  CSW will update on SNF selection as determined.  Eduard Clos, MSW, [place]

## 2013-12-16 NOTE — Progress Notes (Signed)
Echocardiogram 2D Echocardiogram has been performed.  [name] 12/16/2013, 9:00 AM

## 2013-12-16 NOTE — Progress Notes (Signed)
Patient upset about his "heart healthy" diet. RN educated pt on the benefit of the heart healthy diet prescribed by the MD. Pt refused education, stating that if he could not have a regular diet ' he would leave the hospital.' RN to notify MD. Will continue to monitor closely.

## 2013-12-16 NOTE — Progress Notes (Signed)
RN requested to pt's bedside per echo team, pt upset, 'stated that he was tired of having test performed that nobody told him about'. RN educated the pt on the purpose of the procedure. Pt allow the team to conduct bedside testing.

## 2013-12-16 NOTE — Plan of Care (Signed)
Problem: Phase II Progression Outcomes Goal: Progress activity as tolerated unless otherwise ordered Outcome: Progressing Patient sits on the side of the bed to use urinal.

## 2013-12-16 NOTE — Progress Notes (Signed)
Patient Name: [name] Date of Encounter: 12/16/2013  Principal Problem:   PVD (peripheral vascular disease) Active Problems:   Long-term (current) use of anticoagulants   Non-ischemic cardiomyopathy   Atrial fibrillation, permanent    Patient Profile: 68 yo male w/ perm afib, noncompliant w/ coum, CKD, was admitted 07/24 w/ SOB, ischemic RLE, embolectomy 07/24.  Fasciotomy 07/29. Cards following for afib  SUBJECTIVE: He is feeling better, has sat up but not tried to stand or walk yet.  OBJECTIVE Filed Vitals:   12/15/13 1415 12/15/13 1443 12/15/13 2134 12/16/13 0412  BP:  120/71 115/75 94/55  Pulse:  110 127 114  Temp: 98.5 F (36.9 C) 98.5 F (36.9 C) 100.6 F (38.1 C) 99.2 F (37.3 C)  TempSrc:  Oral Oral Oral  Resp: 15 16 18 17   Height:      Weight:      SpO2:  99% 95% 97%    Intake/Output Summary (Last 24 hours) at 12/16/13 1244 Last data filed at 12/16/13 1235  Gross per 24 hour  Intake    120 ml  Output   1900 ml  Net  -1780 ml   Filed Weights   12/10/13 1625 12/11/13 0024  Weight: 172 lb (78.019 kg) 175 lb 0.7 oz (79.4 kg)    PHYSICAL EXAM General: Well developed, well nourished, male in no acute distress. Head: Normocephalic, atraumatic.  Neck: Supple without bruits, JVD elevated. Lungs:  Resp regular and unlabored, some rales and slight wheeze. Heart: rapid and irregular, S1, S2, no S3, S4, or murmur; no rub. Abdomen: Soft, non-tender, non-distended, BS + x 4.  Extremities: No clubbing, cyanosis, edema on right. Dressing not disturbed  Neuro: Alert and oriented X 3. Moves all extremities spontaneously. Psych: Normal affect.  LABS: CBC:  Recent Labs  12/15/13 0405 12/16/13 0940  WBC 10.5 12.3*  HGB 9.1* 8.8*  HCT 28.0* 27.0*  MCV 90.9 91.5  PLT 184 176   INR:  Recent Labs  12/16/13 0940  INR 6.96*   Basic Metabolic Panel:  Recent Labs  12/15/13 0405  NA 144  K 4.5  CL 106  CO2 24  GLUCOSE 85  BUN 20    CREATININE 1.82*  CALCIUM 8.8   BNP: Pro B Natriuretic peptide (BNP)  Date/Time Value Ref Range Status  12/10/2013  6:10 PM 6010.0* 0 - 125 pg/mL Final  06/02/2013 12:32 PM 3125.0* 0 - 125 pg/mL Final   TELE: atrial fibrillation, rapid a lot of the time.  Current Medications:  . buPROPion  75 mg Oral BID WC  . diltiazem  240 mg Oral Daily  . docusate sodium  100 mg Oral Daily  . metoprolol succinate  50 mg Oral BID  . pantoprazole  40 mg Oral Daily  . sodium bicarbonate  1,300 mg Oral BID  . tiotropium  18 mcg Inhalation Daily  . warfarin  7.5 mg Oral Once per day on Sun Tue Wed Thu Sat   And  . warfarin  10 mg Oral Once per day on Mon Fri  . Warfarin - Pharmacist Dosing Inpatient   Does not apply q1800   . dextrose 5 % and 0.45 % NaCl with KCl 20 mEq/L 10 mL/hr (12/15/13 0416)  . heparin 950 Units/hr (12/16/13 0058)  . lactated ringers 20 mL/hr at 12/15/13 1001    ASSESSMENT AND PLAN: Principal Problem:  PVD (peripheral vascular disease) - per [facility], s/p fasciotomy 07/29.   Active Problems:  Long-term (current) use  of anticoagulants - INR coming up, pt gets it checked at [facility] OP clinic, will arrange once d/c date known. Coumadin per pharmacy.  Non-ischemic cardiomyopathy - need daily weights, MD advise on echo. Think he could use some Lasix, but will elevated Cr, will defer to MD.   Atrial fibrillation - rate up, but not sure BP will not tolerate dose change, follow.   Heel pain - consider wound consult   CKD - BUN OK, but Cr slightly above normal range for him, follow  Signed, RHONDA BARRETT , PA-C 12:44 PM 12/16/2013  I have examined the patient and reviewed assessment and plan and discussed with patient.  Agree with above as stated.  CRI-stable.  Lying flat without SHOB.  WIll not plan on increasing diuretic today. Borderline rate control.  BP is too low to increase rate control meds.   VARANASI,JAYADEEP S.

## 2013-12-16 NOTE — Progress Notes (Signed)
Physical Therapy Treatment Patient Details Name: [name] MRN: [id number] DOB: [birth year] Today's Date: 12/16/2013    History of Present Illness This 68 y.o. male amditted with SOB as well as pain and numbness Rt. foot.  He was found to have severe ischemia Rt. LE.  He underwent Rt femoral embolectomy as well as medial and lateral LE fasciotomies in the calf 12/10/13 with I &D with closure on 12/15/13.  PMH includes COPD; bipolar disorder; h/o DVT; s/p bil. THA; s/p pacemaker placement    PT Comments    Pt agreeable to work with PT today after 2:00pm.  Pt able to ambulate 5' with RW and little WB through R LE.  Pt is +2 assistance due to decreased safety/balance.    Follow Up Recommendations  SNF     Equipment Recommendations  None recommended by PT    Recommendations for Other Services       Precautions / Restrictions Precautions Precautions: Fall Precaution Comments: Pt with difficulty WBing on Rt. LE    Mobility  Bed Mobility Overal bed mobility: Needs Assistance Bed Mobility: Supine to Sit     Supine to sit: Supervision     General bed mobility comments: Uses rail with HOB elevated  Transfers Overall transfer level: Needs assistance Equipment used: Rolling walker (2 wheeled) Transfers: Sit to/from Stand Sit to Stand: +2 physical assistance;Min assist Stand pivot transfers: Min assist;+2 safety/equipment Squat pivot transfers: Min assist;+2 safety/equipment     General transfer comment: Pt with decreased safety with transfers.  Due to difficulty WB he leans forwards and also tends to tip RW increasing risk for fall.  Cues for posture and how to keep COG over BOS.  Ambulation/Gait Ambulation/Gait assistance: +2 safety/equipment;Min assist Ambulation Distance (Feet): 5 Feet Assistive device: Rolling walker (2 wheeled) Gait Pattern/deviations: Step-to pattern;Antalgic;Decreased stance time - right     General Gait Details: Little to no weight placed through  R LE and the weight he does put on is through toes.  Callous on L heel as well causing difficulty with WB on L.   Stairs            Wheelchair Mobility    Modified Rankin (Stroke Patients Only)       Balance Overall balance assessment: Needs assistance Sitting-balance support: Feet supported Sitting balance-Leahy Scale: Normal     Standing balance support: Bilateral upper extremity supported Standing balance-Leahy Scale: Poor Standing balance comment: Little weight bearing through R LE.                    Cognition Arousal/Alertness: Awake/alert Behavior During Therapy: WFL for tasks assessed/performed Overall Cognitive Status: Within Functional Limits for tasks assessed                      Exercises Total Joint Exercises Ankle Circles/Pumps: AROM;10 reps;Both;Seated    General Comments        Pertinent Vitals/Pain 6/10 pain R LE    Home Living Family/patient expects to be discharged to:: Skilled nursing facility                    Prior Function Level of Independence: Independent with assistive device(s)      Comments: taxi driver   PT Goals (current goals can now be found in the care plan section) Acute Rehab PT Goals Patient Stated Goal: to get better Time For Goal Achievement: 12/23/13 Potential to Achieve Goals: Good Progress towards PT goals: Progressing toward goals  Frequency  Min 3X/week    PT Plan Current plan remains appropriate    Co-evaluation PT/OT/SLP Co-Evaluation/Treatment: Yes Reason for Co-Treatment: For patient/therapist safety PT goals addressed during session: Balance;Mobility/safety with mobility;Proper use of DME OT goals addressed during session: ADL's and self-care     End of Session Equipment Utilized During Treatment: Gait belt Activity Tolerance: Patient limited by pain Patient left: in chair;with call bell/phone within reach     Time: 0224-0242 PT Time Calculation (min): 18  min  Charges:  $Gait Training: 8-22 mins                    G Codes:      [name] LUBECK 12/16/2013, 4:25 PM

## 2013-12-16 NOTE — Progress Notes (Addendum)
ANTICOAGULATION CONSULT NOTE - Follow Up Consult  Pharmacy Consult for heparin and warfarin Indication: s/p right femoral embolectomy and afib  Allergies  Allergen Reactions  . Digoxin And Related     Dig toxicity 2009  . Penicillins Rash    Patient Measurements: Height: 6\' 3"  (190.5 cm) Weight: 175 lb 0.7 oz (79.4 kg) IBW/kg (Calculated) : 84.5 Heparin Dosing Weight: 79.4 kg  Vital Signs: Temp: 99.2 F (37.3 C) (07/30 0412) Temp src: Oral (07/30 0412) BP: 94/55 mmHg (07/30 0412) Pulse Rate: 114 (07/30 0412)  Labs:  Recent Labs  12/14/13 0416 12/15/13 0405 12/15/13 0700 12/16/13 0940  HGB 10.1* 9.1*  --  8.8*  HCT 31.5* 28.0*  --  27.0*  PLT 168 184  --  176  LABPROT 21.3*  --  21.7* 25.7*  INR 1.84*  --  1.89* 2.35*  HEPARINUNFRC 0.31  --  0.41 0.13*  CREATININE  --  1.82*  --   --     Estimated Creatinine Clearance: 43 ml/min (by C-G formula based on Cr of 1.82).   Medications:  Heparin @ 950 units/hr   Assessment: 68 yo male s/p right femoral embolectomy and also with afib. Heparin was turned off in OR and was not restarted until ~midnight- restarted at same rate and level this morning was unexpectedly subtherapeutic.  Patient was on warfarin per MD, now per pharmacy. INR 2.35 this morning. Warfarin dosing is 7.5mg  daily except 10mg  on Mondays and Fridays.  Hgb dropped further to 8.8, plts 176. No bleeding noted.  Goal of Therapy:  Heparin level 0.3-0.7 units/ml Monitor platelets by anticoagulation protocol: Yes   Plan:  1. Continue heparin at 950 units/hr and recheck a stat heparin level to ensure low reading was accurate 2. Continue warfarin 7.5mg  daily except 10mg  on Mondays and Fridays 3. Daily HL, CBC and INR 4. Follow closely for s/s bleeding with dropping hgb   Calianne Larue D. Jane Broughton, PharmD, BCPS Clinical Pharmacist Pager: [phone] 12/16/2013 10:36 AM  ADDENDUM Heparin level redrawn and was still subtherapeutic at 0.17 units/mL.  Plan: 1.  Increase heparin to 1050 units/hr 2. Recheck heparin level at 2000 tonight if patient will allow. If he refuses, can wait until the morning as he is anticoagulated on warfarin 3. Warfarin and other labs as above  Jrue Yambao D. Jeovanni Heuring, PharmD, BCPS Clinical Pharmacist Pager: [phone] 12/16/2013 1:25 PM

## 2013-12-17 LAB — CBC
HCT: 25.1 % — ABNORMAL LOW (ref 39.0–52.0)
Hemoglobin: 8.1 g/dL — ABNORMAL LOW (ref 13.0–17.0)
MCH: 29.3 pg (ref 26.0–34.0)
MCHC: 32.3 g/dL (ref 30.0–36.0)
MCV: 90.9 fL (ref 78.0–100.0)
PLATELETS: 186 10*3/uL (ref 150–400)
RBC: 2.76 MIL/uL — AB (ref 4.22–5.81)
RDW: 15.7 % — ABNORMAL HIGH (ref 11.5–15.5)
WBC: 9 10*3/uL (ref 4.0–10.5)

## 2013-12-17 LAB — PROTIME-INR
INR: 2.63 — ABNORMAL HIGH (ref 0.00–1.49)
Prothrombin Time: 28.1 seconds — ABNORMAL HIGH (ref 11.6–15.2)

## 2013-12-17 MED ORDER — METOPROLOL TARTRATE 50 MG PO TABS
75.0000 mg | ORAL_TABLET | Freq: Two times a day (BID) | ORAL | Status: DC
  Administered 2013-12-17 – 2013-12-20 (×7): 75 mg via ORAL
  Filled 2013-12-17 (×8): qty 1

## 2013-12-17 NOTE — Progress Notes (Signed)
Subjective: No chest pain, no SOB Pt angry that he may be discharged today, he does not believe he is ready for discharge  Objective: Vital signs in last 24 hours: Temp:  [98.3 F (36.8 C)-99.2 F (37.3 C)] 99.2 F (37.3 C) (07/31 0439) Pulse Rate:  [86-116] 86 (07/31 0439) Resp:  [18-20] 20 (07/31 0439) BP: (95-109)/(66-75) 95/66 mmHg (07/31 0439) SpO2:  [96 %-100 %] 100 % (07/31 0439) Weight:  [178 lb (80.74 kg)] 178 lb (80.74 kg) (07/31 0439) Weight change:  Last BM Date: 12/10/13 Intake/Output from previous day: -1014 07/30 0701 - 07/31 0700 In: 936 [P.O.:840; I.V.:96] Out: 1950 [Urine:1950] Intake/Output this shift:    PE: General:Pleasant affect, NAD Skin:Warm and dry, brisk capillary refill HEENT:normocephalic, sclera clear, mucus membranes moist Heart:Irreg irreg without murmur, gallup, rub or click, tachycardic Lungs: without rales, + rhonchi, no wheezes OMV:[facility], non tender, + BS, do not palpate liver spleen or masses Ext:no lower ext edema, rt leg wrapped Neuro:alert and oriented, MAE, follows commands, + facial symmetry  tele:  afib with RVR up to 140 Lab Results:  Recent Labs  12/16/13 0940 12/17/13 0650  WBC 12.3* 9.0  HGB 8.8* 8.1*  HCT 27.0* 25.1*  PLT 176 186   BMET  Recent Labs  12/15/13 0405  NA 144  K 4.5  CL 106  CO2 24  GLUCOSE 85  BUN 20  CREATININE 1.82*  CALCIUM 8.8   No results found for this basename: TROPONINI, CK, MB,  in the last 72 hours    Lab Results  Component Value Date   TSH 1.999 05/24/2013     Studies/Results: 2D Echo: Left ventricle: The cavity size was mildly dilated. Wall thickness was normal. Systolic function was mildly reduced. The estimated ejection fraction was in the range of 45% to 50%. Diffuse hypokinesis. The study is not technically sufficient to allow evaluation of LV diastolic function. - Aortic valve: There was mild regurgitation. - Left atrium: The atrium was severely  dilated. - Right atrium: The atrium was severely dilated. - Tricuspid valve: There was moderate regurgitation   Medications: I have reviewed the patient'[name] current medications. Scheduled Meds: . buPROPion  75 mg Oral BID WC  . diltiazem  240 mg Oral Daily  . docusate sodium  100 mg Oral Daily  . metoprolol succinate  50 mg Oral BID  . pantoprazole  40 mg Oral Daily  . sodium bicarbonate  1,300 mg Oral BID  . tiotropium  18 mcg Inhalation Daily  . warfarin  7.5 mg Oral Once per day on Sun Tue Wed Thu Sat   And  . warfarin  10 mg Oral Once per day on Mon Fri  . Warfarin - Pharmacist Dosing Inpatient   Does not apply q1800   Continuous Infusions: . dextrose 5 % and 0.45 % NaCl with KCl 20 mEq/L 10 mL/hr (12/15/13 0416)  . lactated ringers 20 mL/hr at 12/15/13 1001   PRN Meds:.acetaminophen, acetaminophen, albuterol, alum & mag hydroxide-simeth, guaiFENesin-dextromethorphan, hydrALAZINE, labetalol, metoprolol, morphine injection, ondansetron, oxyCODONE, oxyCODONE-acetaminophen, phenol  Assessment/Plan: Principal Problem:  PVD (peripheral vascular disease)-POD #7/#2  rt femoral embolectomy, medial and lateral lower extremity fasciotomies of calf. Closure fasciotomies 12/15/13 Active Problems:  Long-term (current) use of anticoagulants- INR slow drift up Heparin/coumadin (sub therapeutic on admit) crossover --now 2.63 INR Non-ischemic cardiomyopathy EF 45-50%  Permanent Atrial fibrillation this AM rate with HR 120- 140, anemia could be adding to tachycardia, ? Add dig for  improved HR vs or in addition to unit of blood. Anemia on admit 14/43 now 8.1/25.1 secondary to surgery Hypotension, BP in 58X to 094M systolic   LOS: 7 days   Time spent with pt. :15 minutes. [facility]  Nurse Practitioner Certified Pager [phone] or after 5pm and on weekends call [phone] 12/17/2013, 7:40 AM   I have examined the patient and reviewed assessment and plan and discussed with patient.  Agree with  above as stated.  Reports that he feels bad.  Rate control has been limited by low BP.  Anemia is also contributing to suboptimal rate control.  HR currently in the 115-130 range while he is at rest.  Increase metoprolol (switch to tartrate)to 75 mg BID.    [name] [name].

## 2013-12-17 NOTE — Progress Notes (Addendum)
  [facility] Progress Note  12/17/2013 9:40 AM 2 Days Post-Op  Subjective:  Does not want to go home until after the weekend. Does not feel ready. Denies chest pain and shortness of breath.   Tmax 99.2 BP sys 90s-100s HR 80s-110s Atrial fibrillation 02 99% RA  Filed Vitals:   12/17/13 0439  BP: 95/66  Pulse: 86  Temp: 99.2 F (37.3 C)  Resp: 20    Physical Exam: Incisions:  Groin incision c/d/i. R lateral and medial fasciotomy closures with serosanguinous drainage. 2+ edema of RLE.  Extremities:  Brisk doppler signals right DP and PT Cardiac: irregularly irregular rhythm   CBC    Component Value Date/Time   WBC 9.0 12/17/2013 0650   RBC 2.76* 12/17/2013 0650   RBC 4.61 12/22/2008 0515   HGB 8.1* 12/17/2013 0650   HCT 25.1* 12/17/2013 0650   PLT 186 12/17/2013 0650   MCV 90.9 12/17/2013 0650   MCH 29.3 12/17/2013 0650   MCHC 32.3 12/17/2013 0650   RDW 15.7* 12/17/2013 0650   LYMPHSABS 4.0 11/30/2012 1513   MONOABS 0.9 11/30/2012 1513   EOSABS 0.4 11/30/2012 1513   BASOSABS 0.0 11/30/2012 1513    BMET    Component Value Date/Time   NA 144 12/15/2013 0405   K 4.5 12/15/2013 0405   CL 106 12/15/2013 0405   CO2 24 12/15/2013 0405   GLUCOSE 85 12/15/2013 0405   BUN 20 12/15/2013 0405   CREATININE 1.82* 12/15/2013 0405   CREATININE 1.72* 07/16/2013 1558   CALCIUM 8.8 12/15/2013 0405   GFRNONAA 36* 12/15/2013 0405   GFRNONAA 40* 07/16/2013 1558   GFRAA 42* 12/15/2013 0405   GFRAA 46* 07/16/2013 1558    INR    Component Value Date/Time   INR 2.63* 12/17/2013 0650   INR 1.7 10/05/2013 1541     Intake/Output Summary (Last 24 hours) at 12/17/13 0940 Last data filed at 12/17/13 0439  Gross per 24 hour  Intake    936 ml  Output   1950 ml  Net  -1014 ml     Assessment:  68 y.o. male is s/p:  Right Femoral Embolectomy; Fasciotomy Right Lower Leg with Intraoperative arteriogram (Right)  INTRA OPERATIVE ARTERIOGRAM (Right) 7 Days Post-Op  Irrigation and  debridement and closure of medial and lateral fasciotomies 2 Days Post-Op  Plan: -Wet to dry dressing on lateral fasciotomy wound.  -Atrial fibrillation: Rate currently 86. Earlier this morning HR 110s. Hypotensive 95/66.  Management per cardiology.  -Surgical blood loss anemia: Hgb 8.1 today. Will monitor. May consider transfusion Hgb <8.  -Heparin discontinued yesterday. Coumadin therapeutic. INR 2.63 today.  -Continue PT/OT.  -Dispo: Patient refusing to be discharged this weekend. Planned on discharging to SNF when coumadin was therapeutic. Will keep him here for now. Dr. Oneida Alar to see patient.    Virgina Jock, PA-C [facility] Office: [phone] Pager: [phone] 12/17/2013 9:40 AM  Agree with above Left foot 2+ DP Right foot warm with doppler, decreased sensation and motor right foot, only a flicker of movement Pt states his family has not reviewed potential SNF sites yet.  He is willing to be discharged but only after he and family have agreed on SNF.  Otherwise pt meets discharge criteria.   Ruta Hinds, MD [facility] [place] Office: [phone] Pager: [phone]

## 2013-12-17 NOTE — Progress Notes (Signed)
ANTICOAGULATION CONSULT NOTE - Follow Up Consult  Pharmacy Consult for warfarin Indication: s/p right femoral embolectomy and afib  Allergies  Allergen Reactions  . Digoxin And Related     Dig toxicity 2009  . Penicillins Rash    Patient Measurements: Height: 6\' 3"  (190.5 cm) Weight: 178 lb (80.74 kg) IBW/kg (Calculated) : 84.5  Vital Signs: Temp: 99.2 F (37.3 C) (07/31 0439) Temp src: Oral (07/31 0439) BP: 95/66 mmHg (07/31 0439) Pulse Rate: 86 (07/31 0439)  Labs:  Recent Labs  12/15/13 0405 12/15/13 0700 12/16/13 0940 12/16/13 1230 12/17/13 0650  HGB 9.1*  --  8.8*  --  8.1*  HCT 28.0*  --  27.0*  --  25.1*  PLT 184  --  176  --  186  LABPROT  --  21.7* 25.7*  --  28.1*  INR  --  1.89* 2.35*  --  2.63*  HEPARINUNFRC  --  0.41 0.13* 0.17*  --   CREATININE 1.82*  --   --   --   --     Estimated Creatinine Clearance: 43.7 ml/min (by C-G formula based on Cr of 1.82).   Assessment: 68 yo male s/p right femoral embolectomy and also with afib. Heparin stopped yesterday as INR therapeutic. INR 2.63 this morning. Warfarin dosing is 7.5mg  daily except 10mg  on Mondays and Fridays- have continued what patient was supposed to be taking at home. Hgb dropped further to 8.1, plts 186. ?ABLA after procedure- No bleeding noted.  Goal of Therapy:  Heparin level 0.3-0.7 units/ml Monitor platelets by anticoagulation protocol: Yes   Plan:  1. Continue warfarin 7.5mg  daily except 10mg  on Mondays and Fridays- should be able to be d/c'd on this dose. 2. Daily PT/INR- if stable after 1 more day, can transition to 3x weekly INR checks. If discharged today, recommend INR check on Tuesday 8/4) 3. Follow closely for s/s bleeding with dropping hgb   Ta Fair D. Marjorie Deprey, PharmD, BCPS Clinical Pharmacist Pager: [phone] 12/17/2013 10:38 AM

## 2013-12-18 LAB — CBC
HCT: 26.2 % — ABNORMAL LOW (ref 39.0–52.0)
Hemoglobin: 8.5 g/dL — ABNORMAL LOW (ref 13.0–17.0)
MCH: 29.8 pg (ref 26.0–34.0)
MCHC: 32.4 g/dL (ref 30.0–36.0)
MCV: 91.9 fL (ref 78.0–100.0)
Platelets: 202 10*3/uL (ref 150–400)
RBC: 2.85 MIL/uL — ABNORMAL LOW (ref 4.22–5.81)
RDW: 15.1 % (ref 11.5–15.5)
WBC: 7.5 10*3/uL (ref 4.0–10.5)

## 2013-12-18 LAB — PROTIME-INR
INR: 2.31 — ABNORMAL HIGH (ref 0.00–1.49)
PROTHROMBIN TIME: 25.4 s — AB (ref 11.6–15.2)

## 2013-12-18 MED ORDER — FUROSEMIDE 40 MG PO TABS
40.0000 mg | ORAL_TABLET | Freq: Once | ORAL | Status: AC
  Administered 2013-12-18: 40 mg via ORAL
  Filled 2013-12-18: qty 1

## 2013-12-18 NOTE — Progress Notes (Signed)
Patient Name: [name] Date of Encounter: 12/18/2013  Principal Problem:   PVD (peripheral vascular disease) Active Problems:   Long-term (current) use of anticoagulants   Non-ischemic cardiomyopathy   Atrial fibrillation, permanent   Length of Stay: 8  SUBJECTIVE Mild pain in the leg, PND the last night.   CURRENT MEDS . buPROPion  75 mg Oral BID WC  . diltiazem  240 mg Oral Daily  . docusate sodium  100 mg Oral Daily  . metoprolol tartrate  75 mg Oral BID  . pantoprazole  40 mg Oral Daily  . sodium bicarbonate  1,300 mg Oral BID  . tiotropium  18 mcg Inhalation Daily  . warfarin  7.5 mg Oral Once per day on Sun Tue Wed Thu Sat   And  . warfarin  10 mg Oral Once per day on Mon Fri  . Warfarin - Pharmacist Dosing Inpatient   Does not apply q1800    OBJECTIVE  Filed Vitals:   12/17/13 2132 12/17/13 2247 12/18/13 0336 12/18/13 0700  BP: 101/67 109/75 102/60   Pulse: 86 110 65   Temp: 98.6 F (37 C)     TempSrc: Oral     Resp: 18     Height:      Weight:    178 lb 5.6 oz (80.9 kg)  SpO2: 95%       Intake/Output Summary (Last 24 hours) at 12/18/13 0812 Last data filed at 12/17/13 2125  Gross per 24 hour  Intake    480 ml  Output   1750 ml  Net  -1270 ml   Filed Weights   12/11/13 0024 12/17/13 0439 12/18/13 0700  Weight: 175 lb 0.7 oz (79.4 kg) 178 lb (80.74 kg) 178 lb 5.6 oz (80.9 kg)    PHYSICAL EXAM  General: Pleasant, NAD. Neuro: Alert and oriented X 3. Moves all extremities spontaneously. Psych: Normal affect. HEENT:  Normal  Neck: Supple without bruits or JVD. Lungs:  Resp regular and unlabored, minimal crackles at base Heart: RRR no s3, s4, or murmurs. Abdomen: Soft, non-tender, non-distended, BS + x 4.  Extremities: No clubbing, edema of the right lower extremity. DP/PT/Radials 2+ and equal bilaterally.  Accessory Clinical Findings  CBC  Recent Labs  12/17/13 0650 12/18/13 0327  WBC 9.0 7.5  HGB 8.1* 8.5*  HCT 25.1* 26.2*    MCV 90.9 91.9  PLT 186 202   Radiology/Studies  Dg Chest 2 View  12/10/2013   CLINICAL DATA:  Chest pain.  Cough.  EXAM: CHEST  2 VIEW  COMPARISON:  Chest CT and chest x-ray 06/02/2013  FINDINGS: The heart is mildly enlarged but stable. The mediastinal and hilar contours are unchanged. Two right ventricular pacer wires are unchanged. Severe chronic pleural and parenchymal scarring changes involving the left lung but no definite acute overlying pulmonary process.  IMPRESSION: Chronic lung changes without definite acute overlying pulmonary process.   Electronically Signed   By: Kalman Jewels M.D.   On: 12/10/2013 18:58   Dg Ang/ext/uni/or Right  12/12/2013   CLINICAL DATA:  Ischemic Right Leg  EXAM: RIGHT ANG/EXT/UNI/ OR  COMPARISON:  None available  FINDINGS: Single portable image shows opacification of the popliteal artery with two-vessel runoff, 1 seen to the lower calf, the other to the lower margin of the film just above the ankle. Both have mild atheromatous irregularity.  IMPRESSION: 1. 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   Electronically Signed   By: Delories Heinz.D.  On: 12/12/2013 13:13    TELE: a-fib 65-115 BPM    ASSESSMENT AND PLAN  1. PVD (peripheral vascular disease)-POD #7/#2  rt femoral embolectomy, medial and lateral lower extremity fasciotomies of calf. Closure fasciotomies 12/15/13  2. Chronic persistent a-fib  - Long-term (current) use of anticoagulants- INR slow drift up Heparin/coumadin (sub therapeutic on admit) crossover --now 2.31 INR. HR controlled most of the time on Metoprolol 75 mg po BID, continue the same dose  3. Non-ischemic cardiomyopathy EF 45-50% - the patient is mildly fluid overloaded, we would give 1 dose of Lasix 40 mg po daily and follow BMP tomorrow  4. Anemia - Hb 8.5,  Follow  5. Hypotension - resolved   Signed, Dorothy Spark MD, [facility] 12/18/2013

## 2013-12-18 NOTE — Progress Notes (Signed)
ANTICOAGULATION CONSULT NOTE - Follow Up Consult  Pharmacy Consult for warfarin Indication: s/p right femoral embolectomy and afib  Allergies  Allergen Reactions  . Digoxin And Related     Dig toxicity 2009  . Penicillins Rash   Vital Signs: Temp: 98.2 F (36.8 C) (08/01 1247) Temp src: Oral (08/01 1247) BP: 120/79 mmHg (08/01 1247) Pulse Rate: 95 (08/01 1247)  Labs:  Recent Labs  12/16/13 0940 12/16/13 1230 12/17/13 0650 12/18/13 0327  HGB 8.8*  --  8.1* 8.5*  HCT 27.0*  --  25.1* 26.2*  PLT 176  --  186 202  LABPROT 25.7*  --  28.1* 25.4*  INR 2.35*  --  2.63* 2.31*  HEPARINUNFRC 0.13* 0.17*  --   --     Estimated Creatinine Clearance: 43.8 ml/min (by C-G formula based on Cr of 1.82).  Assessment: 68 yo male s/p right femoral embolectomy and also with afib. Heparin stopped on 7/30 as INR therapeutic.  INR 2.31 this morning. Warfarin dosing is 7.5mg  daily except 10mg  on Mondays and Fridays- have continued what patient was supposed to be taking at home.  Hgb up to 8.5, plts 202. ?ABLA after procedure- No bleeding noted.  Goal of Therapy:  INR 2-3 Monitor platelets by anticoagulation protocol: Yes   Plan:  -Warfarin 7.5 daily except 10mg  on Mon and Fri -daily INR and CBC -Monitor closely for bleeding -pt refusing d/c on 7/31- may stay through the wknd (medically ready)  Elenor Quinones J 12/18/2013,1:26 PM

## 2013-12-18 NOTE — Progress Notes (Addendum)
  [facility] Progress Note  12/18/2013 8:58 AM 3 Days Post-Op  Subjective:  Continuing to have numbness of right foot and pain in right leg. Is waiting to talk to daughter about SNF choices.   Filed Vitals:   12/18/13 0336  BP: 102/60  Pulse: 65  Temp:   Resp:     Physical Exam: Incisions:  Groin incision is healing fine. Lateral fasciotomy closure with serosanguinous drainage. Medial closure clean and intact. Extremities:  Right pedal edema. Right DP doppler signal.   CBC    Component Value Date/Time   WBC 7.5 12/18/2013 0327   RBC 2.85* 12/18/2013 0327   RBC 4.61 12/22/2008 0515   HGB 8.5* 12/18/2013 0327   HCT 26.2* 12/18/2013 0327   PLT 202 12/18/2013 0327   MCV 91.9 12/18/2013 0327   MCH 29.8 12/18/2013 0327   MCHC 32.4 12/18/2013 0327   RDW 15.1 12/18/2013 0327   LYMPHSABS 4.0 11/30/2012 1513   MONOABS 0.9 11/30/2012 1513   EOSABS 0.4 11/30/2012 1513   BASOSABS 0.0 11/30/2012 1513    BMET    Component Value Date/Time   NA 144 12/15/2013 0405   K 4.5 12/15/2013 0405   CL 106 12/15/2013 0405   CO2 24 12/15/2013 0405   GLUCOSE 85 12/15/2013 0405   BUN 20 12/15/2013 0405   CREATININE 1.82* 12/15/2013 0405   CREATININE 1.72* 07/16/2013 1558   CALCIUM 8.8 12/15/2013 0405   GFRNONAA 36* 12/15/2013 0405   GFRNONAA 40* 07/16/2013 1558   GFRAA 42* 12/15/2013 0405   GFRAA 46* 07/16/2013 1558    INR    Component Value Date/Time   INR 2.31* 12/18/2013 0327   INR 1.7 10/05/2013 1541     Intake/Output Summary (Last 24 hours) at 12/18/13 0858 Last data filed at 12/17/13 2125  Gross per 24 hour  Intake    480 ml  Output   1750 ml  Net  -1270 ml     Assessment:  68 y.o. male is s/p:  Right Femoral Embolectomy; Fasciotomy Right Lower Leg with Intraoperative arteriogram (Right)  INTRA OPERATIVE ARTERIOGRAM (Right) 3 Days Post-Op  Irrigation and debridement and closure of medial and lateral fasciotomies 2 Days Post-Op  Plan: -Continuing to have good doppler signal of  right DP.  -Continue wet-to-dry dressings on fasciotomy closures.  -Atrial fibrillation. Rate controlled.  -Anemia: Hgb trending up today at 8.5. Will monitor.  -Mildly fluid overloaded. Will give lasix 40mg  once today and check BMP tomorrow.  -Appreciate cardiology following.  -Coumadin INR 2.31 -Disposition: Medically ready for discharge. Patient wants to stay through weekend. Anticipate discharge to SNF Monday.     Virgina Jock, PA-C [facility] Office: [phone] Pager: [phone] 12/18/2013 8:58 AM   Left foot dp pulse Right foot warm some ankle flexion, right foot anesthetic Right groin incision healing Awaiting SNF INR therapeutic  Ruta Hinds, MD [facility] [place] Office: [phone] Pager: [phone]

## 2013-12-19 LAB — BASIC METABOLIC PANEL
Anion gap: 14 (ref 5–15)
BUN: 19 mg/dL (ref 6–23)
CALCIUM: 9.2 mg/dL (ref 8.4–10.5)
CO2: 24 mEq/L (ref 19–32)
CREATININE: 1.87 mg/dL — AB (ref 0.50–1.35)
Chloride: 100 mEq/L (ref 96–112)
GFR, EST AFRICAN AMERICAN: 41 mL/min — AB (ref >90)
GFR, EST NON AFRICAN AMERICAN: 35 mL/min — AB (ref >90)
Glucose, Bld: 116 mg/dL — ABNORMAL HIGH (ref 70–99)
Potassium: 4.3 mEq/L (ref 3.7–5.3)
Sodium: 138 mEq/L (ref 137–147)

## 2013-12-19 LAB — CBC
HCT: 30.4 % — ABNORMAL LOW (ref 39.0–52.0)
Hemoglobin: 9.9 g/dL — ABNORMAL LOW (ref 13.0–17.0)
MCH: 29.6 pg (ref 26.0–34.0)
MCHC: 32.6 g/dL (ref 30.0–36.0)
MCV: 90.7 fL (ref 78.0–100.0)
PLATELETS: 264 10*3/uL (ref 150–400)
RBC: 3.35 MIL/uL — ABNORMAL LOW (ref 4.22–5.81)
RDW: 14.7 % (ref 11.5–15.5)
WBC: 7.8 10*3/uL (ref 4.0–10.5)

## 2013-12-19 LAB — PROTIME-INR
INR: 2.06 — ABNORMAL HIGH (ref 0.00–1.49)
PROTHROMBIN TIME: 23.2 s — AB (ref 11.6–15.2)

## 2013-12-19 MED ORDER — WARFARIN SODIUM 10 MG PO TABS
10.0000 mg | ORAL_TABLET | Freq: Once | ORAL | Status: AC
  Administered 2013-12-19: 10 mg via ORAL
  Filled 2013-12-19: qty 1

## 2013-12-19 NOTE — Progress Notes (Signed)
ANTICOAGULATION CONSULT NOTE - Follow Up Consult  Pharmacy Consult for Coumadin Indication: atrial fibrillation and s/p femoral embolectomy  Allergies  Allergen Reactions  . Digoxin And Related     Dig toxicity 2009  . Penicillins Rash    Patient Measurements: Height: 6\' 3"  (190.5 cm) Weight: 177 lb 7.5 oz (80.5 kg) IBW/kg (Calculated) : 84.5  Vital Signs: Temp: 97.8 F (36.6 C) (08/02 0527) Temp src: Oral (08/02 0527) BP: 109/61 mmHg (08/02 0527) Pulse Rate: 61 (08/02 0527)  Labs:  Recent Labs  12/17/13 0650 12/18/13 0327 12/19/13 0417  HGB 8.1* 8.5* 9.9*  HCT 25.1* 26.2* 30.4*  PLT 186 202 264  LABPROT 28.1* 25.4* 23.2*  INR 2.63* 2.31* 2.06*  CREATININE  --   --  1.87*    Estimated Creatinine Clearance: 42.5 ml/min (by C-G formula based on Cr of 1.87).  Assessment:   POD # 9 right femoral embolectomy & fasciotomy, POD # 4 fasciotomy closure.   On Coumadin prior to admission for atrial fibrillation.  IV heparin 7/24-7/30. Coumadin resumed on 7/25 with home regimen of 7.5 mg daily except 10 mg on Mondays and Fridays. Heparin stopped on 7/30 when INR therapeutic on 7/30.  INR remains low therapeutic (2.06), but has trended down over the last few days.  2.63->2.31->2.06.  CBC trending up.  Goal of Therapy:  INR 2-3 Monitor platelets by anticoagulation protocol: Yes   Plan:   Increase today's Coumadin dose to 10 mg.  Usual Coumadin regimen of 7.5 mg daily, except 10 mg on Mondays and Fridays to resume on 8/3.  PT/INR and CBC in am.   Arty Baumgartner,  Pager: [phone] 12/19/2013,3:26 PM

## 2013-12-19 NOTE — Progress Notes (Addendum)
  [facility] Progress Note  12/19/2013 6:29 AM 4 Days Post-Op  Subjective:  Complaining of right foot numbness that is unchanged.    Filed Vitals:   12/19/13 0527  BP: 109/61  Pulse: 61  Temp: 97.8 F (36.6 C)  Resp: 18    Physical Exam: Incisions:  Lower leg dressing clean and dry. Groin incision without hematoma. Healing well.  Extremities: Strong right DP doppler signal. 2+ edema.  Cardiac: Irregularly irregular rhythm  CBC    Component Value Date/Time   WBC 7.8 12/19/2013 0417   RBC 3.35* 12/19/2013 0417   RBC 4.61 12/22/2008 0515   HGB 9.9* 12/19/2013 0417   HCT 30.4* 12/19/2013 0417   PLT 264 12/19/2013 0417   MCV 90.7 12/19/2013 0417   MCH 29.6 12/19/2013 0417   MCHC 32.6 12/19/2013 0417   RDW 14.7 12/19/2013 0417   LYMPHSABS 4.0 11/30/2012 1513   MONOABS 0.9 11/30/2012 1513   EOSABS 0.4 11/30/2012 1513   BASOSABS 0.0 11/30/2012 1513    BMET    Component Value Date/Time   NA 138 12/19/2013 0417   K 4.3 12/19/2013 0417   CL 100 12/19/2013 0417   CO2 24 12/19/2013 0417   GLUCOSE 116* 12/19/2013 0417   BUN 19 12/19/2013 0417   CREATININE 1.87* 12/19/2013 0417   CREATININE 1.72* 07/16/2013 1558   CALCIUM 9.2 12/19/2013 0417   GFRNONAA 35* 12/19/2013 0417   GFRNONAA 40* 07/16/2013 1558   GFRAA 41* 12/19/2013 0417   GFRAA 46* 07/16/2013 1558    INR    Component Value Date/Time   INR 2.06* 12/19/2013 0417   INR 1.7 10/05/2013 1541     Intake/Output Summary (Last 24 hours) at 12/19/13 0629 Last data filed at 12/19/13 4920  Gross per 24 hour  Intake    840 ml  Output   3300 ml  Net  -2460 ml     Assessment:  68 y.o. male is s/p:  Right Femoral Embolectomy; Fasciotomy Right Lower Leg with Intraoperative arteriogram (Right)  INTRA OPERATIVE ARTERIOGRAM (Right) 4 Days Post-Op  Irrigation and debridement and closure of medial and lateral fasciotomies  3 Days Post-Op   Plan: -Left leg incisions healing well. DP doppler signal.  -Anemia: Hgb increasing today to  9.9 -On Coumadin. INR 2.06 today -Dispo: Anticipate d/c to SNF tomorrow.   Virgina Jock, PA-C [facility] Office: [phone] Pager: [phone] 12/19/2013 6:29 AM   Exam details as above Viable but neuro dysfunction unchanged right foot 2+ DP left foot   Awaiting SNF  Ruta Hinds, MD [facility] [place] Office: [phone] Pager: [phone]

## 2013-12-19 NOTE — Progress Notes (Signed)
Patient chose [facility]. Clinical Education officer, museum ([place]) made [place] aware via carefinder. CSW will continue to follow and assist as needed.   Blima Rich, [place] Weekend CSW [phone]

## 2013-12-19 NOTE — Progress Notes (Signed)
Clinical Education officer, museum (CSW) left message with patient's daughter [name] to get SNF choice. Per patient it is between [facility] and [facility] and his daughter is going to [facility] today. CSW explained that we need SNF choice as soon as possible so we can start the [employer] authorization. Patient verbalized his understanding. CSW will continue to follow and assist as needed.   Blima Rich, [place] Weekend CSW [phone]

## 2013-12-20 ENCOUNTER — Telehealth: Payer: Self-pay | Admitting: Vascular Surgery

## 2013-12-20 LAB — CBC
HCT: 29.8 % — ABNORMAL LOW (ref 39.0–52.0)
Hemoglobin: 9.6 g/dL — ABNORMAL LOW (ref 13.0–17.0)
MCH: 29.6 pg (ref 26.0–34.0)
MCHC: 32.2 g/dL (ref 30.0–36.0)
MCV: 92 fL (ref 78.0–100.0)
Platelets: 262 10*3/uL (ref 150–400)
RBC: 3.24 MIL/uL — ABNORMAL LOW (ref 4.22–5.81)
RDW: 15.1 % (ref 11.5–15.5)
WBC: 7.2 10*3/uL (ref 4.0–10.5)

## 2013-12-20 LAB — PROTIME-INR
INR: 2.4 — ABNORMAL HIGH (ref 0.00–1.49)
PROTHROMBIN TIME: 26.2 s — AB (ref 11.6–15.2)

## 2013-12-20 MED ORDER — OXYCODONE-ACETAMINOPHEN 10-325 MG PO TABS: 1.0000 | ORAL_TABLET | ORAL | Status: DC | PRN

## 2013-12-20 NOTE — Clinical Social Work Psychosocial (Signed)
Clinical Social Work Department CLINICAL SOCIAL WORK PLACEMENT NOTE 12/20/2013  Patient:  [name], [name]  Account Number:  [id number] Admit date:  12/10/2013  Clinical Social Worker:  Megan Salon  Date/time:  12/14/2013 11:47 AM  Clinical Social Work is seeking post-discharge placement for this patient at the following level of care:   [place]   (*CSW will update this form in Epic as items are completed)   12/14/2013  Patient/family provided with [place] Department of Clinical Social Work's list of facilities offering this level of care within the geographic area requested by the patient (or if unable, by the patient's family).  12/14/2013  Patient/family informed of their freedom to choose among providers that offer the needed level of care, that participate in Medicare, Medicaid or managed care program needed by the patient, have an available bed and are willing to accept the patient.  12/14/2013  Patient/family informed of [facility]' ownership interest in [facility], as well as of the fact that they are under no obligation to receive care at this facility.  PASARR submitted to EDS on 12/14/2013 PASARR number received on   FL2 transmitted to all facilities in geographic area requested by pt/family on  12/14/2013 FL2 transmitted to all facilities within larger geographic area on   Patient informed that his/her managed care company has contracts with or will negotiate with  certain facilities, including the following:     Patient/family informed of bed offers received:  12/17/2013 Patient chooses bed at [place] Physician recommends and patient chooses bed at    Patient to be transferred to [facility] on  12/20/2013 Patient to be transferred to facility by Ambulance Patient and family notified of transfer on 12/20/2013 Name of family member notified:  Davy Pique  The following physician request were entered in Epic:   Additional  Comments: Per MD patient ready for DC to [facility]. RN, patient, patient's family, and facility notified of DC. RN given number for report. DC packet on chart. [facility] transport requested for patient. CSW signing off.   Liz [place] MSW, [place], [phone]

## 2013-12-20 NOTE — Progress Notes (Addendum)
Preparing patient for discharge to [facility]. Pt's blood pressure 82/63. Pt is asymptomatic, NAD/discomfort. Virgina Jock, PA paged.  Will continue to monitor.  - Soyla Dryer, RN   Spoke with Virgina Jock, PA. No new orders received. Pt is ok for discharge.

## 2013-12-20 NOTE — Progress Notes (Signed)
Late Entry: [facility] contacted for report. Report given to one of the floor nurses.  Will continue to monitor. Roselyn Reef Kamerin Grumbine,RN

## 2013-12-20 NOTE — Progress Notes (Addendum)
  [facility] Progress Note  12/20/2013 7:24 AM 5 Days Post-Op  Subjective:  Has been trying to mobilizing. Still complaining of numbness to right foot and leg, but sensation is improving. Complains of chest discomfort. No shortness of breath or distress.   Filed Vitals:   12/20/13 0715  BP:   Pulse: 85  Temp:   Resp: 20    Physical Exam: Incisions:  Fasciotomy closures with minor serosanguinous drainage. Right groin healing. No hematoma Extremities:  Right DP doppler signal. Sensation improved to right foot. Motor intact. Cardiac: irregular rhythm. Left chest wall tenderness to palpation.    CBC    Component Value Date/Time   WBC 7.2 12/20/2013 0410   RBC 3.24* 12/20/2013 0410   RBC 4.61 12/22/2008 0515   HGB 9.6* 12/20/2013 0410   HCT 29.8* 12/20/2013 0410   PLT 262 12/20/2013 0410   MCV 92.0 12/20/2013 0410   MCH 29.6 12/20/2013 0410   MCHC 32.2 12/20/2013 0410   RDW 15.1 12/20/2013 0410   LYMPHSABS 4.0 11/30/2012 1513   MONOABS 0.9 11/30/2012 1513   EOSABS 0.4 11/30/2012 1513   BASOSABS 0.0 11/30/2012 1513    BMET    Component Value Date/Time   NA 138 12/19/2013 0417   K 4.3 12/19/2013 0417   CL 100 12/19/2013 0417   CO2 24 12/19/2013 0417   GLUCOSE 116* 12/19/2013 0417   BUN 19 12/19/2013 0417   CREATININE 1.87* 12/19/2013 0417   CREATININE 1.72* 07/16/2013 1558   CALCIUM 9.2 12/19/2013 0417   GFRNONAA 35* 12/19/2013 0417   GFRNONAA 40* 07/16/2013 1558   GFRAA 41* 12/19/2013 0417   GFRAA 46* 07/16/2013 1558    INR    Component Value Date/Time   INR 2.40* 12/20/2013 0410   INR 1.7 10/05/2013 1541     Intake/Output Summary (Last 24 hours) at 12/20/13 0724 Last data filed at 12/20/13 0425  Gross per 24 hour  Intake      0 ml  Output    500 ml  Net   -500 ml     Assessment:  68 y.o. male is s/p:  Right Femoral Embolectomy; Fasciotomy Right Lower Leg with Intraoperative arteriogram (Right)  INTRA OPERATIVE ARTERIOGRAM (Right) 10 Days Post-Op  Irrigation and debridement  and closure of medial and lateral fasciotomies  5 Days Post-Op   Plan: -Continuing to have right DP doppler signal. Fasciotomy closures healing. Sensation returning to foot. -Chest discomfort likely musculoskeletal based on physical exam.  -Coumadin therapeutic 2.4 -Continue mobilization.   -Await insurance authorization for discharge to SNF today.  -Will follow-up in 2 weeks with Dr. Donnetta Hutching.    Virgina Jock, PA-C [facility] Office: [phone] Pager: [phone] 12/20/2013 7:24 AM

## 2013-12-20 NOTE — Telephone Encounter (Signed)
Message copied by Gena Fray on Mon Dec 20, 2013  3:16 PM ------      Message from: Peter Minium K      Created: Mon Dec 20, 2013 10:50 AM      Regarding: Schedule                   ----- Message -----         From: Alvia Grove, PA-C         Sent: 12/20/2013  10:39 AM           To: [facility] Charge Pool            S/p right femoral embolectomy and fasciotomies 7/24; fasciotomy closure 12/15/13            F/u with Dr. Donnetta Hutching in 2 weeks with suture removal.             Thanks      Maudie Mercury ------

## 2013-12-20 NOTE — Telephone Encounter (Signed)
Spoke with pt, he is currently with [facility], will wait to schedule with RN, dpm

## 2013-12-20 NOTE — Progress Notes (Signed)
PT Cancellation Note  Patient Details Name: [name] MRN: [id number] DOB: [birth year]   Cancelled Treatment:    Reason Eval/Treat Not Completed: Other (comment) (Refused due to d/c today to [facility].  Encouraged but pt declined)   INGOLD,Norval Slaven 12/20/2013, 11:02 AM [facility] [phone] (pager)

## 2013-12-20 NOTE — Discharge Summary (Signed)
[facility] Discharge Summary  [name] 02/18/1946 68 y.o. male  [id number]  Admission Date: 12/10/2013  Discharge Date: 12/20/2013  Physician: Rosetta Posner, MD  Admission Diagnosis: Bilateral hip pain [719.45]   HPI:   This is a 68 y.o. male who presented to the emergency room on 12/10/13 with the major complaint of shortness of breath. During evaluation of this patient reported that he had pain and numbness in his right foot as well. On further questioning the patient was suspected to have ischemia and Dr. Donnetta Hutching was consulted for further evaluation. In discussing with the patient he reports a chronic mild numbness in both legs and reports no difficulty as far as walking dose. He reports that 3 or 4 days ago he noted that he had pain and numbness specifically in his right leg from his knee distally. He has no sensation from his knee distally and has not had any for several days. He has not been able to put any weight on his right foot for several days. He has no prior history of lower extremity arterial insufficiency. Does have a history of portal vein thrombosis. Also has a history of chronic atrial fibrillation and has been prescribed Coumadin. The patient admits he has not been taking his Coumadin anticoagulation. No prior history of embolic event. He is known to have a markedly diminished ejection fraction of 25-30% on echocardiogram July 2014. Does have a history of chronic mild renal insufficiency  Hospital Course:  The patient was admitted to the hospital and taken to the operating room on 12/10/2013 and underwent right femoral embolectomy, intraoperative arteriogram and medial and lateral extremity fasciotomies. He was started on a heparin drip post-operatively in addition to his warfarin.   On POD 1, he had biphasic to triphasic anterior tibial and posterior tibial doppler flow. He had some oozing at his fasciotomy closures. He had no motor or sensory function  in his right foot. Cardiology was consulted for evaluation of afib and lower extremity embolus.   On POD 2-7, he was kept on heparin until his coumadin became therapeutic. His hemoglobin trended down to 8.1, but did not require transfusion. He had no signs and symptoms of bleeding. He continued to have good doppler signal. He had his fasciotomies closed on 12/15/13.   On POD 8, his coumadin was therapeutic and he was ready to be discharged. His hemoglobin started to trend upwards. However the patient did not feel ready to do so. He was kept in the hospital over the weekend until POD 10. He was discharged on POD 10 to SNF in good condition. He was advised to remain compliant with his coumadin use.     The remainder of the hospital course consisted of increasing mobilization and increasing intake of solids without difficulty.  He will follow up in 2  weeks with Dr. Donnetta Hutching.   CBC    Component Value Date/Time   WBC 7.2 12/20/2013 0410   RBC 3.24* 12/20/2013 0410   RBC 4.61 12/22/2008 0515   HGB 9.6* 12/20/2013 0410   HCT 29.8* 12/20/2013 0410   PLT 262 12/20/2013 0410   MCV 92.0 12/20/2013 0410   MCH 29.6 12/20/2013 0410   MCHC 32.2 12/20/2013 0410   RDW 15.1 12/20/2013 0410   LYMPHSABS 4.0 11/30/2012 1513   MONOABS 0.9 11/30/2012 1513   EOSABS 0.4 11/30/2012 1513   BASOSABS 0.0 11/30/2012 1513    BMET    Component Value Date/Time   NA 138 12/19/2013  0417   K 4.3 12/19/2013 0417   CL 100 12/19/2013 0417   CO2 24 12/19/2013 0417   GLUCOSE 116* 12/19/2013 0417   BUN 19 12/19/2013 0417   CREATININE 1.87* 12/19/2013 0417   CREATININE 1.72* 07/16/2013 1558   CALCIUM 9.2 12/19/2013 0417   GFRNONAA 35* 12/19/2013 0417   GFRNONAA 40* 07/16/2013 1558   GFRAA 41* 12/19/2013 0417   GFRAA 46* 07/16/2013 1558     Discharge Instructions:   The patient is discharged to SNF with extensive instructions on wound care and progressive ambulation.  They are instructed not to drive or perform any heavy lifting until returning to see the  physician in his office.    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 PVD (peripheral vascular disease) 12/10/2013  . Decreased hearing 09/08/2013  . Rash and nonspecific skin eruption 08/13/2013  . Atrial fibrillation, permanent 05/22/2013  . Severe malnutrition 05/18/2013  . Non-ischemic cardiomyopathy 05/17/2013  . Unstable angina 05/17/2013  . Fatigue 11/30/2012  . Pre-ulcerative corn or callous 10/21/2012  . Lightheadedness 10/12/2012  . Chest pain 07/07/2012  . Underweight 03/19/2012  . Insomnia 11/19/2010  . Chronic combined systolic and diastolic congestive heart failure 06/20/2010  . Long-term (current) use of anticoagulants 06/05/2010  . Thrombosis, portal vein 06/05/2010  . Depression 01/19/2010  . Chronic kidney disease 11/18/2009  . WEIGHT LOSS 08/16/2009  . HYPERTHYROIDISM 03/13/2009  . Bilateral hip pain 03/23/2007  . INTERMITTENT CLAUDICATION 10/24/2006  . OSTEOARTHRITIS 09/05/2006  . G E R D 09/04/2006  . HYDROCELE NOS 09/04/2006  . COPD (chronic obstructive pulmonary disease) 08/28/2006  . BACK PAIN 06/02/2006  . ERECTILE DYSFUNCTION 03/01/2006  . TOBACCO ABUSE 03/01/2006  . HYPERTENSION 03/01/2006  . BRADYCARDIA-TACHYCARDIA SYNDROME 03/01/2006  . SPONDYLOSIS 03/01/2006  . AVASCULAR NECROSIS 03/01/2006   Past Medical History  Diagnosis Date  . Hypertension   . Chronic systolic heart failure     a. NICM - EF 35% with normal cors 2003. b. Last echo 11/2012 - EF 25-30%.  . Depression   . GERD (gastroesophageal reflux disease)   . Portal vein thrombosis   . Avascular necrosis     right hip s/p replacement  . Gastritis   . Alcohol abuse   . Erectile dysfunction   . Bipolar disorder   . Hydrocele, unspecified   . Intermittent claudication   . Lung nodule     Chest CT scan on 12/14/2008 showed a nodular opacity at the left lung base  felt to most likely represent scarring.  Follow-up chest CT scan on 06/02/2010 showed parenchymal scarring in the left apex, left  lower lobe, and lingula; some of this scarring at the left base had a nodular appearance, unchanged. Chest 09/2012 - stable.  . 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  Thyroid scan 08/28/2009 was normal with no focal areas of abnormal increased or decreased activity seen; the uptake of I 131 sodium iodide at 24 hours was 5.7%.  TSH and free T4 normalized by 08/16/2009.  . Intestinal obstruction   . COPD (chronic obstructive pulmonary disease)   . Clostridium difficile colitis   . E coli bacteremia   . DVT (deep venous thrombosis)     He has hypercoagulability with multiple prior DVTs  . Pleural plaque     H/o asbestos exposure. Chest CT on 06/02/2010 showed stable extensive calcified  pleural plaques involving the left hemithorax, consistent with asbestos related pleural disease.  . Weight loss     Normal colonoscopy by Dr. Olevia Perches on 11/06/2010.  . Tachycardia-bradycardia syndrome     a. 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 b. [employer] gen change 2010.  [name] PAF (paroxysmal atrial fibrillation)     Paroxysmal, failed medical therapy with amiodarone but has done very well with multaq;  d/c 2/2 to recurrent AFib;  Tikosyn load c/b Hyperkalemia during admx 05/2013 => rate control strategy  . 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  . Noncompliance   . Atrial flutter   . Thrombocytopenia   . Hyperkalemia     type 4 RTA  . Pacemaker   . Shortness of breath     "all the time now" (07/02/2013)  . On home oxygen therapy     "2L prn" (07/02/2013)  . Pneumonia   . Cluster headaches     "~ twice/wk" (07/02/2013)  . Osteoarthritis   . Spondylosis   . Arthritis     "hips and legs" (07/02/2013)  . Chronic back pain     "all over my  back" (07/02/2013)  . Anxiety   . CKD (chronic kidney disease)     Renal U/S 12/04/2009 showed no pathological findings. Labs 12/04/2009 include normal ESR, C3, C4; neg ANA; SPEP showed nonspecific increase in the alpha-2 region with no M-spike; UPEP showed no monoclonal free light chains; urine IFE showed polyclonal increase in feree Kappa and/or free Lambda light chains. Baseline Cr reported 1.7-2.5.       Medication List    ASK your doctor about these medications       albuterol 108 (90 BASE) MCG/ACT inhaler  Commonly known as:  PROVENTIL HFA;VENTOLIN HFA  Inhale 2 puffs into the lungs every 6 (six) hours as needed for wheezing or shortness of breath.     buPROPion 75 MG tablet  Commonly known as:  WELLBUTRIN  Take 1 tablet (75 mg total) by mouth 2 (two) times daily.     diltiazem 240 MG 24 hr capsule  Commonly known as:  DILACOR XR  Take 240 mg by mouth daily.     metoprolol succinate 50 MG 24 hr tablet  Commonly known as:  TOPROL-XL  Take 1 tablet (50 mg total) by mouth 2 (two) times daily. Take with or immediately following a meal.     omeprazole 20 MG capsule  Commonly known as:  PRILOSEC  Take 2 capsules (40 mg total) by mouth daily.     oxyCODONE-acetaminophen 10-325 MG per tablet  Commonly known as:  PERCOCET  Take 1 tablet by mouth every 4 (four) hours as needed for pain. Do not take more than 5 doses in a 24 hour period.     sodium bicarbonate 650 MG tablet  Take 2 tablets (1,300 mg total) by mouth 2 (two) times daily.     tiotropium 18 MCG inhalation capsule  Commonly known as:  SPIRIVA  Place 1 capsule (18 mcg total) into inhaler and inhale daily.     warfarin 5 MG tablet  Commonly known as:  COUMADIN  Take 7.5-10 mg by mouth daily. Take 60m on Monday and Friday, 7.560mall other days        Percocet  #30 No Refill  Disposition: SNF  Patient's condition: is Good  Follow up: 1. Dr. EaDonnetta Hutchingn 2 weeks  Virgina Jock, PA-C [facility] [phone] 12/20/2013  10:09 AM  - For VQI Registry use --- Instructions: Press F2 to tab through selections.  Delete question if not applicable.   Post-op:  Wound infection: No  Graft infection: No  Transfusion: No   New Arrhythmia: No Ipsilateral amputation: No, _0  Minor, _1  BKA, _2  AKA Discharge patency: _3  Primary, _4  Primary assisted, _5  Secondary, _6  Occluded Patency judged by: _7  Dopper only, _8  Palpable graft pulse, _9  Palpable distal pulse, _10  ABI inc. > 0.15, _11  Duplex Discharge ABI: R 1.25, L 1.30 D/C Ambulatory Status: Ambulatory with Assistance  Complications: MI: No, <[id number]>_3<\/[id number]>_07  Troponin only, _13  EKG or Clinical CHF: No Resp failure:No, _14  Pneumonia, _15  Ventilator Chg in renal function: No, _16  Inc. Cr > 0.5, _17  Temp. Dialysis, _18  Permanent dialysis Stroke: No, _19  Minor, _20  Major Return to OR: No  Reason for return to OR: _21  Bleeding, _22  Infection, _23  Thrombosis, _24  Revision  Discharge medications: Statin use:  no ASA use:  yes Plavix use:  no Beta blocker use: yes Coumadin use: yes

## 2013-12-21 ENCOUNTER — Non-Acute Institutional Stay (SKILLED_NURSING_FACILITY): Payer: Commercial Managed Care - HMO | Admitting: Internal Medicine

## 2013-12-21 DIAGNOSIS — I739 Peripheral vascular disease, unspecified: Secondary | ICD-10-CM

## 2013-12-21 DIAGNOSIS — I4891 Unspecified atrial fibrillation: Secondary | ICD-10-CM

## 2013-12-21 DIAGNOSIS — I428 Other cardiomyopathies: Secondary | ICD-10-CM

## 2013-12-21 DIAGNOSIS — I4821 Permanent atrial fibrillation: Secondary | ICD-10-CM

## 2013-12-21 DIAGNOSIS — J449 Chronic obstructive pulmonary disease, unspecified: Secondary | ICD-10-CM

## 2013-12-22 NOTE — Progress Notes (Signed)
HISTORY & PHYSICAL  DATE: 12/21/2013   FACILITY: [facility]  LEVEL OF CARE: SNF (31)  ALLERGIES:  Allergies  Allergen Reactions  . Digoxin And Related     Dig toxicity 2009  . Penicillins Rash    CHIEF COMPLAINT:  Manage PVD, cardiomyopathy and atrial fibrillation  HISTORY OF PRESENT ILLNESS: Patient is a 68 year old African American male who is admitted to this facility for short-term rehabilitation after his recent hospitalization.  PVD: The patient's peripheral vascular disease remains table. The patient denies ongoing claudication. No complications reported from the medication(s) currently being used. Patient was diagnosed with an arterial embolism and thrombus of the right lower extremity. He underwent right femoral embolectomy, intraoperative arterioGram and.and medial and lateral extremity fasciotomies. He tolerated the procedures well. Patient denies any lower extremity pain. He is tolerating the medications without any side effects.   ATRIAL FIBRILLATION: the patients atrial fibrillation remains stable.  The patient denies DOE, tachycardia, orthopnea, transient neurological sx, pedal edema, palpitations, & PNDs.  No complications noted from the medications currently being used.  CARDIOMYOPATHY: The patient's cardiomyopathy remains stable. Patient denies increasing lower extremity swelling, shortness of breath, chest pain, palpitations, orthopnea or PNDs. No complications reported from the medications currently being used. The patient has non-ischemic cardiomyopathy. EF 25-30% in 7-14.  PAST MEDICAL HISTORY :  Past Medical History  Diagnosis Date  . Hypertension   . Chronic systolic heart failure     a. NICM - EF 35% with normal cors 2003. b. Last echo 11/2012 - EF 25-30%.  . Depression   . GERD (gastroesophageal reflux disease)   . Portal vein thrombosis   . Avascular necrosis     right hip s/p replacement  . Gastritis   . Alcohol abuse     . Erectile dysfunction   . Bipolar disorder   . Hydrocele, unspecified   . Intermittent claudication   . Lung nodule     Chest CT scan on 12/14/2008 showed a nodular opacity at the left lung base felt to most likely represent scarring.  Follow-up chest CT scan on 06/02/2010 showed parenchymal scarring in the left apex, left  lower lobe, and lingula; some of this scarring at the left base had a nodular appearance, unchanged. Chest 09/2012 - stable.  . 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  Thyroid scan 08/28/2009 was normal with no focal areas of abnormal increased or decreased activity seen; the uptake of I 131 sodium iodide at 24 hours was 5.7%.  TSH and free T4 normalized by 08/16/2009.  . Intestinal obstruction   . COPD (chronic obstructive pulmonary disease)   . Clostridium difficile colitis   . E coli bacteremia   . DVT (deep venous thrombosis)     He has hypercoagulability with multiple prior DVTs  . Pleural plaque     H/o asbestos exposure. Chest CT on 06/02/2010 showed stable extensive calcified pleural plaques involving the left hemithorax, consistent with asbestos related pleural disease.  . Weight loss     Normal colonoscopy by Dr. Olevia Perches on 11/06/2010.  . Tachycardia-bradycardia syndrome     a. 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 b. St. Jude gen change 2010.  [name] PAF (paroxysmal atrial fibrillation)     Paroxysmal, failed medical therapy with amiodarone but has done very well with multaq;  d/c 2/2 to recurrent AFib;  Tikosyn load c/b Hyperkalemia during admx 05/2013 => rate control strategy  .  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  . Noncompliance   . Atrial flutter   . Thrombocytopenia   . Hyperkalemia     type 4 RTA  . Pacemaker   . Shortness of breath     "all the time now" (07/02/2013)  . On home oxygen therapy     "2L prn"  (07/02/2013)  . Pneumonia   . Cluster headaches     "~ twice/wk" (07/02/2013)  . Osteoarthritis   . Spondylosis   . Arthritis     "hips and legs" (07/02/2013)  . Chronic back pain     "all over my back" (07/02/2013)  . Anxiety   . CKD (chronic kidney disease)     Renal U/S 12/04/2009 showed no pathological findings. Labs 12/04/2009 include normal ESR, C3, C4; neg ANA; SPEP showed nonspecific increase in the alpha-2 region with no M-spike; UPEP showed no monoclonal free light chains; urine IFE showed polyclonal increase in feree Kappa and/or free Lambda light chains. Baseline Cr reported 1.7-2.5.    PAST SURGICAL HISTORY: Past Surgical History  Procedure Laterality Date  . Hemiarthroplasty hip Left 09/09/2002    bipolar; Dr. Philip Carter  . Pacemaker insertion  02/2003    Dr. John Tysinger, [employer] pulse generator identity SR model #5172 serial #[id number]; gen change by JA  . Total hip arthroplasty Right 03/08/2008     By Dr. Edgar Paul  . Tee without cardioversion N/A 05/27/2013    Procedure: TRANSESOPHAGEAL ECHOCARDIOGRAM (TEE);  Surgeon: Mark Skains, MD;  Location: [facility];  Service: Cardiovascular;  Laterality: N/A;  . Cardioversion N/A 05/27/2013    Procedure: CARDIOVERSION;  Surgeon: Mark Skains, MD;  Location: [facility];  Service: Cardiovascular;  Laterality: N/A;  . Cardiac catheterization  2003    Nl cors, EF 35%  . Pacemaker placement  06/17/2008    Lead and generator change w/ [employer] Medical Tendril ST model 1788TC-58 (serial  #[id number]).       . Insert / replace / remove pacemaker    . Embolectomy Right 12/10/2013    Procedure: Right Femoral Embolectomy; Fasciotomy Right Lower Leg with Intraoperative arteriogram;  Surgeon: Todd F Early, MD;  Location: [facility] OR;  Service: Vascular;  Laterality: Right;  . Intraoperative arteriogram Right 12/10/2013    Procedure: INTRA OPERATIVE ARTERIOGRAM;  Surgeon: Todd F Early, MD;  Location: [facility] OR;  Service: Vascular;  Laterality:  Right;  . Fasciotomy closure Right 12/15/2013    Procedure: LEG FASCIOTOMY CLOSURE;  Surgeon: Todd F Early, MD;  Location: [facility] OR;  Service: Vascular;  Laterality: Right;    SOCIAL HISTORY:  reports that he quit smoking about 6 months ago. His smoking use included Cigarettes. He has a 81 pack-year smoking history. He has never used smokeless tobacco. He reports that he drinks alcohol. He reports that he uses illicit drugs (Marijuana) about once per week.  FAMILY HISTORY:  Family History  Problem Relation Age of Onset  . Hypertension Mother   . Cancer Brother     Unsure of type.  . Asthma Mother   . Coronary artery disease Mother   . Colon cancer Neg Hx   . Lung cancer Neg Hx   . Prostate cancer Neg Hx     CURRENT MEDICATIONS: Reviewed per MAR/see medication list  REVIEW OF SYSTEMS:  See HPI otherwise 14 point ROS is negative.  PHYSICAL EXAMINATION  VS:    See VS section  GENERAL: no acute distress, normal body habitus EYES: conjunctivae normal, sclerae normal, normal eye lids MOUTH/THROAT: lips without lesions,no lesions in the mouth,tongue is without lesions,uvula elevates in midline NECK: supple, trachea midline, no neck masses, no thyroid tenderness, no thyromegaly LYMPHATICS: no LAN in the neck, no supraclavicular LAN RESPIRATORY: breathing is even & unlabored, BS CTAB CARDIAC: Heart rate irregularly irregular, no murmur,no extra heart sounds, no edema GI:  ABDOMEN: abdomen soft, normal BS, no masses, no tenderness  LIVER/SPLEEN: no hepatomegaly, no splenomegaly MUSCULOSKELETAL: HEAD: normal to inspection  EXTREMITIES: LEFT UPPER EXTREMITY: full range of motion, normal strength & tone RIGHT UPPER EXTREMITY:  full range of motion, normal strength & tone LEFT LOWER EXTREMITY:  full range of motion, normal strength & tone RIGHT LOWER EXTREMITY:  full range of motion, normal strength & tone PSYCHIATRIC: the patient is alert & oriented to person, affect & behavior  appropriate  LABS/RADIOLOGY:  Labs reviewed: Basic Metabolic Panel:  Recent Labs  06/03/13 0535 06/04/13 0417 06/05/13 0455  06/10/13 0500  06/11/13 0628 06/12/13 0425  12/13/13 0547 12/15/13 0405 12/19/13 0417  NA 141 139 142  < > 142  < > 143 145  < > 143 144 138  K 6.2* 5.9* 5.9*  < > 6.4*  < > 4.7 4.8  < > 4.9 4.5 4.3  CL 105 108 105  < > 104  < > 105 104  < > 108 106 100  CO2 _0 < > 26  < > 24 27  < > _1 GLUCOSE 86 84 62*  < > 111*  < > 89 97  < > 103* 85 116*  BUN 35* 31* 36*  < > 26*  < > 37* 41*  < > 26* 20 19  CREATININE 1.84* 1.64* 1.76*  < > 1.78*  < > 1.91* 2.15*  < > 1.83* 1.82* 1.87*  CALCIUM 9.1 8.9 9.7  < > 9.5  < > 8.8 8.9  < > 8.4 8.8 9.2  MG 2.1 1.8 2.4  --   --   --   --   --   --   --   --   --   PHOS  --   --   --   --  4.5  --  5.4* 4.8*  --   --   --   --   < > = values in this interval not displayed. Liver Function Tests:  Recent Labs  05/24/13 0555 06/02/13 1232  06/11/13 0628 06/12/13 0425 12/10/13 1810  AST 18 26  --   --   --  20  ALT 22 35  --   --   --  11  ALKPHOS 58 68  --   --   --  89  BILITOT 0.2* 0.3  --   --   --  1.1  PROT 6.3 7.1  --   --   --  7.5  ALBUMIN 3.0* 3.5  < > 3.1* 3.4* 4.4  < > = values in this interval not displayed. CBC:  Recent Labs  12/18/13 0327 12/19/13 0417 12/20/13 0410  WBC 7.5 7.8 7.2  HGB 8.5* 9.9* 9.6*  HCT 26.2* 30.4* 29.8*  MCV 91.9 90.7 92.0  PLT 202 264 262   Cardiac Enzymes:  Recent Labs  07/03/13 1910 07/04/13 0400 12/10/13 1810  TROPONINI <0.30 <0.30 <0.30   CBG:  Recent Labs  05/30/13 1134 12/10/13 2304  12/11/13 0041  GLUCAP 95 74 161*    Limited Lower Extremity Arterial Duplex Study  Patient:    Rossman, Kentavious F MR #:       [id number] Study Date: 12/10/2013 Gender:     M Age:        67 Height: Weight: BSA: Pt. Status: Room:       2W01C   ADMITTING    Todd Early  ATTENDING    Todd Early  ORDERING     Todd Early  REFERRING    Steinl, Kevin E   SONOGRAPHER  Jill Eunice, RVT, RDMS  Reports also to:  ------------------------------------------------------------------- History and indications:  History  Diagnostic evaluation. RLE pain, numbness.  ------------------------------------------------------------------- Study information:  Portable.  Study status:  Urgent.  Procedure:  A vascular evaluation was performed with the patient in the supine position. The right common iliac, right external iliac, right common femoral, right profunda femoral, right femoral, right popliteal, right anterior tibial, right posterior tibial, and right dorsal pedal arteries were studied. Image quality was adequate.    Limited lower extremity arterial duplex study.     Right lateral evaluation with ankle-brachial index and pulse volume recording.  Birthdate: Patient birthdate: 10/22/1945.  Age:  Patient is 67 yr old.  Sex: Gender: male.  Study date:  Study date: 12/10/2013. Study time: 05:55 PM.  Location:  Bedside.  Patient status:  Emergency department. Arterial flow:  +-----------------+---------+-----------------+-------------------+ Location         V sys    Flow analysis    Comment             +-----------------+---------+-----------------+-------------------+ Right common     83 cm/s  Triphasic        homogenous material femoral                   waveform         noted within lumen                                             with narrowing area                                            of flow. Waveforms                                             appear to be                                                   normal.             +-----------------+---------+-----------------+-------------------+ Right profunda   -372 cm/sTriphasic        ------------------- femoral -                 waveform                             proximal                                                        +-----------------+---------+-----------------+-------------------+   Right femoral    ---------Flow not         -------------------                           visualized                           +-----------------+---------+-----------------+-------------------+ Right popliteal -29 cm/s  -----------------minimal flow noted  proximal                                   proximally.         +-----------------+---------+-----------------+-------------------+ Right anterior   ---------Flow not         ------------------- tibial                    visualized                           +-----------------+---------+-----------------+-------------------+ Right posterior  ---------Flow not         ------------------- tibial                    visualized                           +-----------------+---------+-----------------+-------------------+  ------------------------------------------------------------------- Summary:  - Triphasic flow noted in the right common femoral artery.   50-99% stenosis of the right profunda femoral artery. Homogenous   material is noted within vessel walls of both areas.   No flow noted in right femoral artery. Minimal flow noted in   right proximal popliteal artery, but no flow in distal popliteal   artery or in pedal arteries, anterior tibial and posterior   tibial.     Based on lack of collateral flow and echotexture within lumen,   this appears to be arterial thrombus. - Incidental finding= calcified thrombus noted in right common   femoral and femoral veins.  Lower Extremity Arterial Evaluation  Patient:    Greogory, Cornette MR #:       [id number] Study Date: 12/13/2013 Gender:     M Age:        78 Height: Weight: BSA: Pt. Status: Room:       6Y04H   Redmond Baseman  ATTENDING    Dyke Maes Early  SONOGRAPHER  Toma Copier, RVS  Reports also  to:  ------------------------------------------------------------------- History and indications:  Indications  444.22 Arterial embolism and thrombosis of lower extremity. Status post right femoral artery embolectomy , status post medial and lateral right lower extremity fasciotomy in the calf.  History  Diagnostic evaluation.  Right lower extremity discomfort.  ------------------------------------------------------------------- Study information:  Study status:  Routine.  Procedure:  A vascular evaluation was performed. Image quality was good.    ABI.     Segmental pressure measurements, ankle-brachial index, and photoplethysmography. Birthdate:  Patient birthdate: [birth year].  Age:  Patient is 68 yr old.  Sex:  Gender: male.  Study date:  Study date: 12/13/2013. Study time: 09:41 AM.  Location:  Vascular laboratory.  Patient status:  Inpatient.  Brachial pressures:  +--------+-----+----+---+         RightLeftMax +--------+-----+----+---+ [id number]  320 233 +--------+-----+----+---+  Arterial pressure indices:  +-----------------+---------+--------------+---------+ Location  Pressure Brachial indexWaveform  +-----------------+---------+--------------+---------+ Right dorsal ped 120 mm Hg1.04          Triphasic +-----------------+---------+--------------+---------+ Right post tibial144 mm Hg1.25          Triphasic +-----------------+---------+--------------+---------+ Left dorsal ped  149 mm Hg1.3           Triphasic +-----------------+---------+--------------+---------+ Left post tibial 145 mm Hg1.26          Triphasic +-----------------+---------+--------------+---------+  ------------------------------------------------------------------- Summary: ABIs and Doppler waveforms are within normal limits bilaterally at rest post operative.  Echocardiography  (Report amended )  Patient:    Tsutomu, Barfoot MR #:        [id number] Study Date: 12/16/2013 Gender:     M Age:        13 Height:     190.5 cm Weight:     79.4 kg BSA:        2.04 m^2 Pt. Status: Room:       2W01C   ADMITTING    Todd Early  ATTENDING    Sherren Mocha Early  ORDERING     Barrett, Evelene Croon  REFERRING    Barrett, Evelene Croon  SONOGRAPHER  Jimmy Reel, RDCS  PERFORMING   [facility], Inpatient  cc:  ------------------------------------------------------------------- LV EF: 45% -   50%  ------------------------------------------------------------------- Indications:      Cardiomyopathy - ischemic 414.8.  DVT 453.9.  ------------------------------------------------------------------- History:   PMH:   Atrial fibrillation.  ------------------------------------------------------------------- Study Conclusions  - Left ventricle: The cavity size was mildly dilated. Wall   thickness was normal. Systolic function was mildly reduced. The   estimated ejection fraction was in the range of 45% to 50%.   Diffuse hypokinesis. The study is not technically sufficient to   allow evaluation of LV diastolic function. - Aortic valve: There was mild regurgitation. - Left atrium: The atrium was severely dilated. - Right atrium: The atrium was severely dilated. - Tricuspid valve: There was moderate regurgitation.  ------------------------------------------------------------------- Labs, prior tests, procedures, and surgery: Permanent pacemaker system implantation.  Echocardiography.  M-mode, complete 2D, spectral Doppler, and color Doppler.  Birthdate:  Patient birthdate: 1946-04-01.  Age:  Patient is 68 yr old.  Sex:  Gender: male.  Height:  Height: 190.5 cm. Height: 75 in.  Weight:  Weight: 79.4 kg. Weight: 174.6 lb.  Body mass index:  BMI: 21.9 kg/m^2.  Body surface area:    BSA: 2.04 m^2.  Blood pressure:     94/55  Patient status:  Inpatient.  Study date:  Study date: 12/16/2013. Study time: 09:05 AM.   Location: Bedside.  -------------------------------------------------------------------  ------------------------------------------------------------------- Left ventricle:  The cavity size was mildly dilated. Wall thickness was normal. Systolic function was mildly reduced. The estimated ejection fraction was in the range of 45% to 50%. Diffuse hypokinesis. The study is not technically sufficient to allow evaluation of LV diastolic function. There was no evidence of elevated ventricular filling pressure by Doppler parameters.  ------------------------------------------------------------------- Aortic valve:   Trileaflet; mildly thickened leaflets. Sclerosis without stenosis.  Doppler:  There was mild regurgitation.  ------------------------------------------------------------------- Aorta:  The aorta was normal, not dilated, and non-diseased. Aortic root: The aortic root was normal in size. Ascending aorta: The ascending aorta was mildly dilated.  ------------------------------------------------------------------- Mitral valve:   Structurally normal valve.   Leaflet separation was normal.  Doppler:  Transvalvular velocity was within the normal range. There was no evidence for stenosis. There was trivial regurgitation.  ------------------------------------------------------------------- Left atrium:  The atrium was severely dilated.  ------------------------------------------------------------------- Right ventricle:  The cavity size was normal. Wall thickness was normal. Pacer wire or catheter noted in right ventricle. Systolic function was normal.  ------------------------------------------------------------------- Pulmonic valve:    Structurally normal valve.   Cusp separation was normal.  Doppler:  Transvalvular velocity was within the normal range. There was trivial regurgitation.  ------------------------------------------------------------------- Tricuspid valve:    Structurally normal valve.   Leaflet separation was normal.  Doppler:  Transvalvular velocity was within the normal range. There was moderate regurgitation.  ------------------------------------------------------------------- Right atrium:  The atrium was severely dilated.   ------------------------------------------------------------------- Post procedure conclusions Ascending Aorta:  - The aorta was normal, not dilated, and non-diseased.  ------------------------------------------------------------------- Amended  Mihai Croitoru, MD 2015-07-30T11:07:19  ------------------------------------------------------------------- Measurements   Left ventricle                             Value         Reference  LV ID, ED, PLAX chordal            (H)     53.6   mm     43 - 52  LV ID, ES, PLAX chordal            (H)     44.4   mm     23 - 38  LV fx shortening, PLAX chordal     (L)     17     %      >=29  LV PW thickness, ED                        11.3   mm     ---------  IVS/LV PW ratio, ED                (N)     0.99          <=1.3  LV end-diastolic volume, 1-p A2C           130    ml     ---------  LV end-systolic volume, 1-p A2C            51     ml     ---------  LV end-diastolic volume, 1-p A4C           115    ml     ---------  LV end-systolic volume, 1-p A4C            62     ml     ---------  LV ejection fraction, 1-p A4C              46     %      ---------  Stroke volume, 1-p A4C                     53     ml     ---------  LV end-diastolic volume/bsa, 1-p           56     ml/m^2 ---------  A4C  LV end-systolic volume/bsa, 1-p            30     ml/m^2 ---------  A4C  Stroke volume/bsa, 1-p A4C                 26     ml/m^2 ---------  LV end-diastolic volume, 2-p               123      ml     ---------  LV end-systolic volume, 2-p                60     ml     ---------  LV ejection fraction, 2-p                  51     %      ---------  Stroke volume, 2-p                          62     ml     ---------  LV end-diastolic volume/bsa, 2-p           60     ml/m^2 ---------  LV end-systolic volume/bsa, 2-p            30     ml/m^2 ---------  Stroke volume/bsa, 2-p                     30.6   ml/m^2 ---------    Ventricular septum                         Value         Reference  IVS thickness, ED                          11.2   mm     ---------    Aortic valve                               Value         Reference  Aortic regurg pressure half-time           565    ms     ---------    Aorta                                      Value         Reference  Aortic root ID, ED                         35     mm     ---------  Ascending aorta ID, A-P, S                 44     mm     ---------    Left atrium                                Value         Reference  LA ID, A-P, ES                             51     mm     ---------  LA ID/bsa, A-P                     (H)     2.5    cm/m^2 <=2.2  LA volume, ES, 1-p A4C                       72.4   ml     ---------  LA volume/bsa, ES, 1-p A4C                 35.4   ml/m^2 ---------  LA volume, ES, 1-p A2C                     54.7   ml     ---------  LA volume/bsa, ES, 1-p A2C                 26.8   ml/m^2 ---------  LA volume, ES, 2-p                         60.5   ml     ---------  LA volume/bsa, ES, 2-p                     29.6   ml/m^2 ---------    Tricuspid valve                            Value         Reference  Tricuspid regurg peak velocity             283.37 cm/s   ---------  Tricuspid peak RV-RA gradient              32     mm Hg  ---------  Tricuspid maximal regurg velocity,         283.37 cm/s   ---------  PISA    Pulmonic valve                             Value         Reference  Pulmonic regurg velocity, ED               149    cm/s   ---------  Pulmonic regurg gradient, ED               9      mm Hg  ---------  CHEST  2 VIEW   COMPARISON:  Chest CT and chest x-ray 06/02/2013   FINDINGS: The heart is mildly  enlarged but stable. The mediastinal and hilar contours are unchanged. Two right ventricular pacer wires are unchanged. Severe chronic pleural and parenchymal scarring changes involving the left lung but no definite acute overlying pulmonary process.   IMPRESSION: Chronic lung changes without definite acute overlying pulmonary process.   RIGHT ANG/EXT/UNI/ OR   COMPARISON:  None available   FINDINGS: Single portable image shows opacification of the popliteal artery with two-vessel runoff, 1 seen to the lower calf, the other to the lower margin of the film just above the ankle. Both have mild atheromatous irregularity.   IMPRESSION: 1. 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    ASSESSMENT/PLAN:  PVD-status post right femoral embolectomy and fasciotomies. Continue rehabilitation.   atrial fibrillation-rate controlled. Cardiomyopathy-compensated. COPD-compensated Renovascular Hypertension-well-controlled Acute blood loss anemia-check hemoglobin  Chronic kidney disease-check renal functions Check CBC and BMP  I have reviewed patient's medical records received at admission/from hospitalization.  CPT CODE: 24097  Gayani Y Dasanayaka, [place] [phone]

## 2013-12-30 ENCOUNTER — Non-Acute Institutional Stay (SKILLED_NURSING_FACILITY): Payer: Commercial Managed Care - HMO | Admitting: Adult Health

## 2013-12-30 ENCOUNTER — Encounter: Payer: Self-pay | Admitting: Adult Health

## 2013-12-30 DIAGNOSIS — K219 Gastro-esophageal reflux disease without esophagitis: Secondary | ICD-10-CM

## 2013-12-30 DIAGNOSIS — I739 Peripheral vascular disease, unspecified: Secondary | ICD-10-CM

## 2013-12-30 DIAGNOSIS — I4821 Permanent atrial fibrillation: Secondary | ICD-10-CM

## 2013-12-30 DIAGNOSIS — F329 Major depressive disorder, single episode, unspecified: Secondary | ICD-10-CM

## 2013-12-30 DIAGNOSIS — J449 Chronic obstructive pulmonary disease, unspecified: Secondary | ICD-10-CM

## 2013-12-30 DIAGNOSIS — I10 Essential (primary) hypertension: Secondary | ICD-10-CM

## 2013-12-30 DIAGNOSIS — N183 Chronic kidney disease, stage 3 unspecified: Secondary | ICD-10-CM

## 2013-12-30 DIAGNOSIS — F3289 Other specified depressive episodes: Secondary | ICD-10-CM

## 2013-12-30 DIAGNOSIS — I4891 Unspecified atrial fibrillation: Secondary | ICD-10-CM

## 2013-12-30 DIAGNOSIS — F32A Depression, unspecified: Secondary | ICD-10-CM

## 2013-12-30 NOTE — Progress Notes (Signed)
Patient ID: [name], male   DOB: [birth year], 68 y.o.   MRN: [id number]              PROGRESS NOTE  DATE: 12/30/2013   FACILITY: [facility]  LEVEL OF CARE: SNF (31)  Acute Visit8/03/15 from  CHIEF COMPLAINT:  Discharge Notes  HISTORY OF PRESENT ILLNESS:  This is a 68 year old male who is for discharge home with Home health PT and Nursing. He has been admitted to [facility] on 12/20/13 from [facility] with Arterial embolism and thrombus of RLE S/P right femoral embolectomy, intra-operative arteriogram and medial and lateral extremity fasciotomies. Patient was admitted to this facility for short-term rehabilitation after the patient's recent hospitalization.  Patient has completed SNF rehabilitation and therapy has cleared the patient for discharge.  Reassessment of ongoing problem(s):  HTN: Pt 's HTN remains stable.  Denies CP, sob, DOE, pedal edema, headaches, dizziness or visual disturbances.  No complications from the medications currently being used.  Last BP : 122/74  DEPRESSION: The depression remains stable. Patient denies ongoing feelings of sadness, insomnia, anedhonia or lack of appetite. No complications reported from the medications currently being used. Staff do not report behavioral problems.  ATRIAL FIBRILLATION: the patients atrial fibrillation remains stable.  The patient denies DOE, tachycardia, orthopnea, transient neurological sx, pedal edema, palpitations, & PNDs.  No complications noted from the medications currently being used.   PAST MEDICAL HISTORY : Reviewed.  No changes/see problem list  CURRENT MEDICATIONS: Reviewed per MAR/see medication list  REVIEW OF SYSTEMS:  GENERAL: no change in appetite, no fatigue, no weight changes, no fever, chills or weakness RESPIRATORY: no cough, SOB, DOE, wheezing, hemoptysis CARDIAC: no chest pain, edema or palpitations GI: no abdominal pain, diarrhea, constipation, heart burn, nausea or  vomiting  PHYSICAL EXAMINATION  GENERAL: no acute distress, normal body habitus EYES: conjunctivae normal, sclerae normal, normal eye lids NECK: supple, trachea midline, no neck masses, no thyroid tenderness, no thyromegaly LYMPHATICS: no LAN in the neck, no supraclavicular LAN RESPIRATORY: breathing is even & unlabored, BS CTAB CARDIAC: irregularly irregular, no murmur,no extra heart sounds, no edema GI: abdomen soft, normal BS, no masses, no tenderness, no hepatomegaly, no splenomegaly EXTREMITIES:  Able to move all 4 extremities PSYCHIATRIC: the patient is alert & oriented to person, affect & behavior appropriate  LABS/RADIOLOGY: 12/22/13  WBC 10.6 hemoglobin 9.4 hematocrit 31.5 MCV 94.9 sodium 142 potassium 4.9 glucose 111 BUN 21 creatinine 1.8 calcium 8.8 Labs reviewed: Basic Metabolic Panel:  Recent Labs  06/03/13 0535 06/04/13 0417 06/05/13 0455  06/10/13 0500  06/11/13 0628 06/12/13 0425  12/13/13 0547 12/15/13 0405 12/19/13 0417  NA 141 139 142  < > 142  < > 143 145  < > 143 144 138  K 6.2* 5.9* 5.9*  < > 6.4*  < > 4.7 4.8  < > 4.9 4.5 4.3  CL 105 108 105  < > 104  < > 105 104  < > 108 106 100  CO2 23 21 20   < > 26  < > 24 27  < > 25 24 24   GLUCOSE 86 84 62*  < > 111*  < > 89 97  < > 103* 85 116*  BUN 35* 31* 36*  < > 26*  < > 37* 41*  < > 26* 20 19  CREATININE 1.84* 1.64* 1.76*  < > 1.78*  < > 1.91* 2.15*  < > 1.83* 1.82* 1.87*  CALCIUM 9.1  8.9 9.7  < > 9.5  < > 8.8 8.9  < > 8.4 8.8 9.2  MG 2.1 1.8 2.4  --   --   --   --   --   --   --   --   --   PHOS  --   --   --   --  4.5  --  5.4* 4.8*  --   --   --   --   < > = values in this interval not displayed. Liver Function Tests:  Recent Labs  05/24/13 0555 06/02/13 1232  06/11/13 0628 06/12/13 0425 12/10/13 1810  AST 18 26  --   --   --  20  ALT 22 35  --   --   --  11  ALKPHOS 58 68  --   --   --  89  BILITOT 0.2* 0.3  --   --   --  1.1  PROT 6.3 7.1  --   --   --  7.5  ALBUMIN 3.0* 3.5  < > 3.1* 3.4*  4.4  < > = values in this interval not displayed.  CBC:  Recent Labs  12/18/13 0327 12/19/13 0417 12/20/13 0410  WBC 7.5 7.8 7.2  HGB 8.5* 9.9* 9.6*  HCT 26.2* 30.4* 29.8*  MCV 91.9 90.7 92.0  PLT 202 264 262   Cardiac Enzymes:  Recent Labs  07/03/13 1910 07/04/13 0400 12/10/13 1810  TROPONINI <0.30 <0.30 <0.30   CBG:  Recent Labs  05/30/13 1134 12/10/13 2304 12/11/13 0041  GLUCAP 95 74 161*    EXAM: CHEST  2 VIEW   COMPARISON:  Chest CT and chest x-ray 06/02/2013   FINDINGS: The heart is mildly enlarged but stable. The mediastinal and hilar contours are unchanged. Two right ventricular pacer wires are unchanged. Severe chronic pleural and parenchymal scarring changes involving the left lung but no definite acute overlying pulmonary process.   IMPRESSION: Chronic lung changes without definite acute overlying pulmonary process. EXAM: RIGHT ANG/EXT/UNI/ OR   COMPARISON:  None available   FINDINGS: Single portable image shows opacification of the popliteal artery with two-vessel runoff, 1 seen to the lower calf, the other to the lower margin of the film just above the ankle. Both have mild atheromatous irregularity.   IMPRESSION: 1. 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   ASSESSMENT/PLAN:  PVD S/P right femoral embolectomy and fasciotomies - for Home health PT and Nursing Atrial Fibrillation - rate controlled; continue Toprol-XL and Coumadin Hypertension - well controlled; continue Diltiazem and Toprol-XL Depression - continue Wellbutrin Chronic kidney disease, stage III - stable GERD - stable; continue Prilosec COPD - continue Spiriva daily and albuterol when necessary   I have filled out patient's discharge paperwork and written prescriptions.  Patient will receive home health PT and Nursing.  Total discharge time: Less than 30 minutes  Discharge time involved coordination of the discharge process with Education officer, museum,  nursing staff and therapy department. Medical justification for home health services verified.  CPT CODE: 97673  Seth Bake - NP [facility] [phone]

## 2014-01-03 ENCOUNTER — Encounter: Payer: Self-pay | Admitting: Vascular Surgery

## 2014-01-04 ENCOUNTER — Encounter: Payer: Self-pay | Admitting: Vascular Surgery

## 2014-01-04 ENCOUNTER — Ambulatory Visit (INDEPENDENT_AMBULATORY_CARE_PROVIDER_SITE_OTHER): Payer: Commercial Managed Care - HMO | Admitting: Vascular Surgery

## 2014-01-04 VITALS — BP 130/112 | HR 50 | Temp 98.1°F | Resp 16 | Ht 75.0 in | Wt 171.5 lb

## 2014-01-04 DIAGNOSIS — R209 Unspecified disturbances of skin sensation: Secondary | ICD-10-CM

## 2014-01-04 DIAGNOSIS — Z48812 Encounter for surgical aftercare following surgery on the circulatory system: Secondary | ICD-10-CM

## 2014-01-04 DIAGNOSIS — I739 Peripheral vascular disease, unspecified: Secondary | ICD-10-CM

## 2014-01-04 DIAGNOSIS — Z4802 Encounter for removal of sutures: Secondary | ICD-10-CM

## 2014-01-04 DIAGNOSIS — M79661 Pain in right lower leg: Secondary | ICD-10-CM | POA: Insufficient documentation

## 2014-01-04 DIAGNOSIS — R200 Anesthesia of skin: Secondary | ICD-10-CM

## 2014-01-04 DIAGNOSIS — M79609 Pain in unspecified limb: Secondary | ICD-10-CM

## 2014-01-04 IMAGING — CR DG PELVIS 1-2V
2 series · 2 of 2 positions shown · non-contrast
Comparison: CT of the abdomen pelvis 10/12/2012, 06/02/2010

CLINICAL DATA: Fall.  Pain along the lateral aspects of the left
hip.

PELVIS - 1-2 VIEW

[t pelvis ap (1 of 2)]
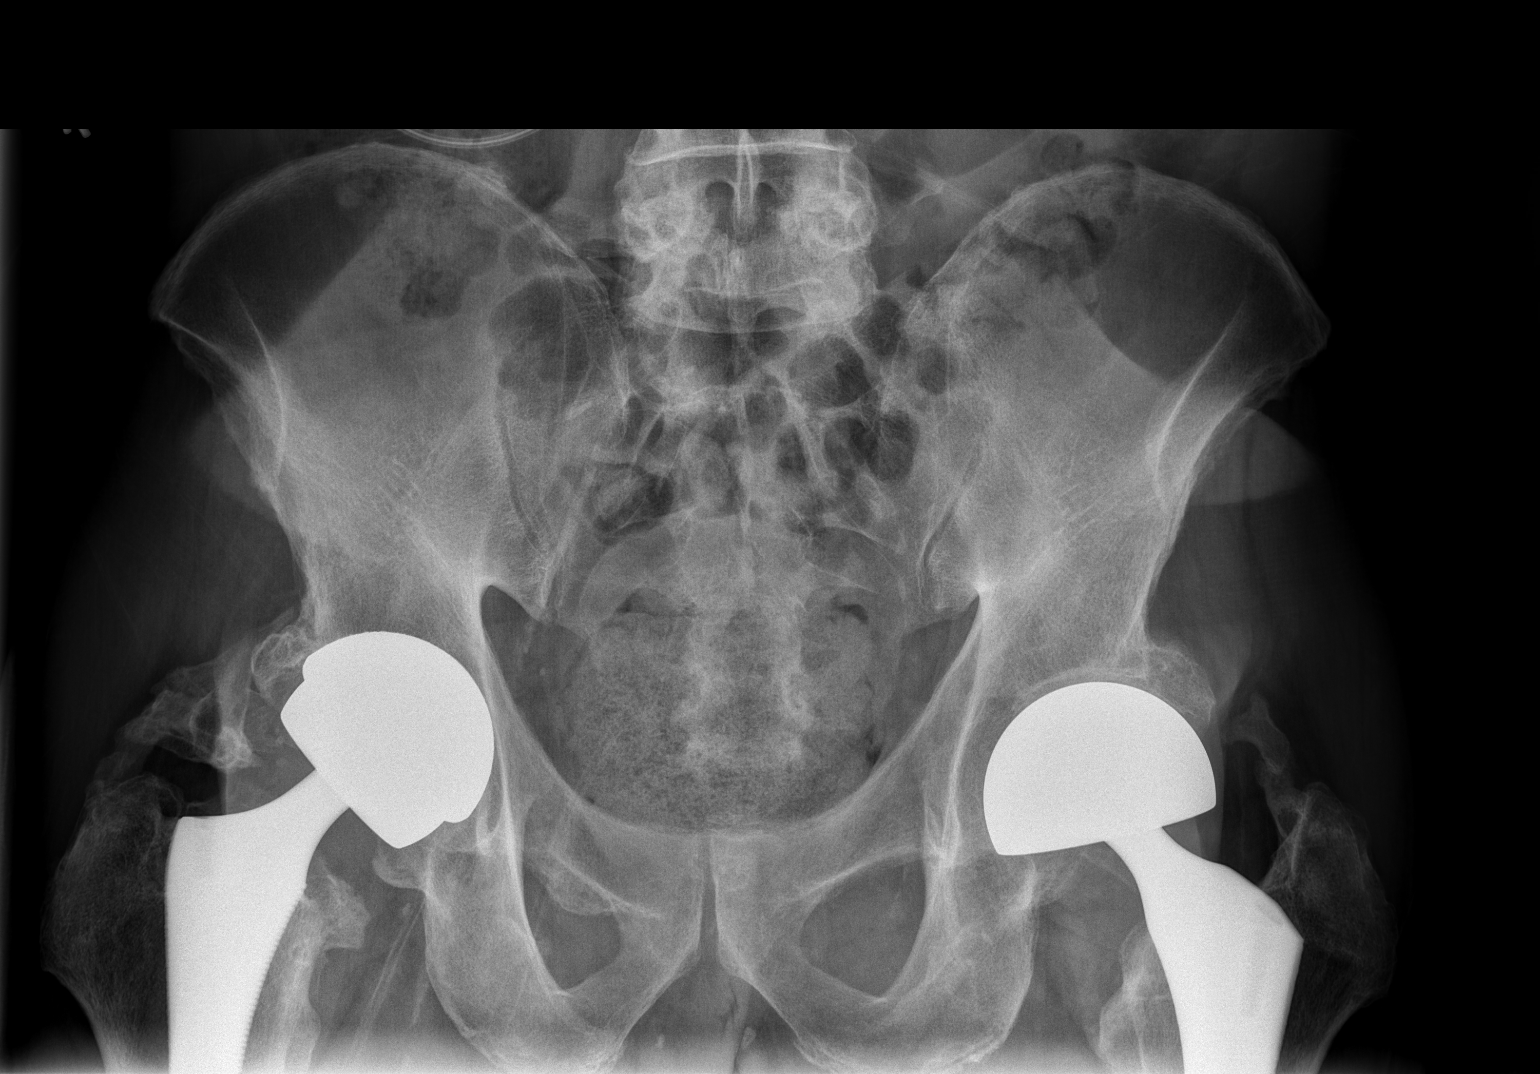

[t pelvis ap (2 of 2)]
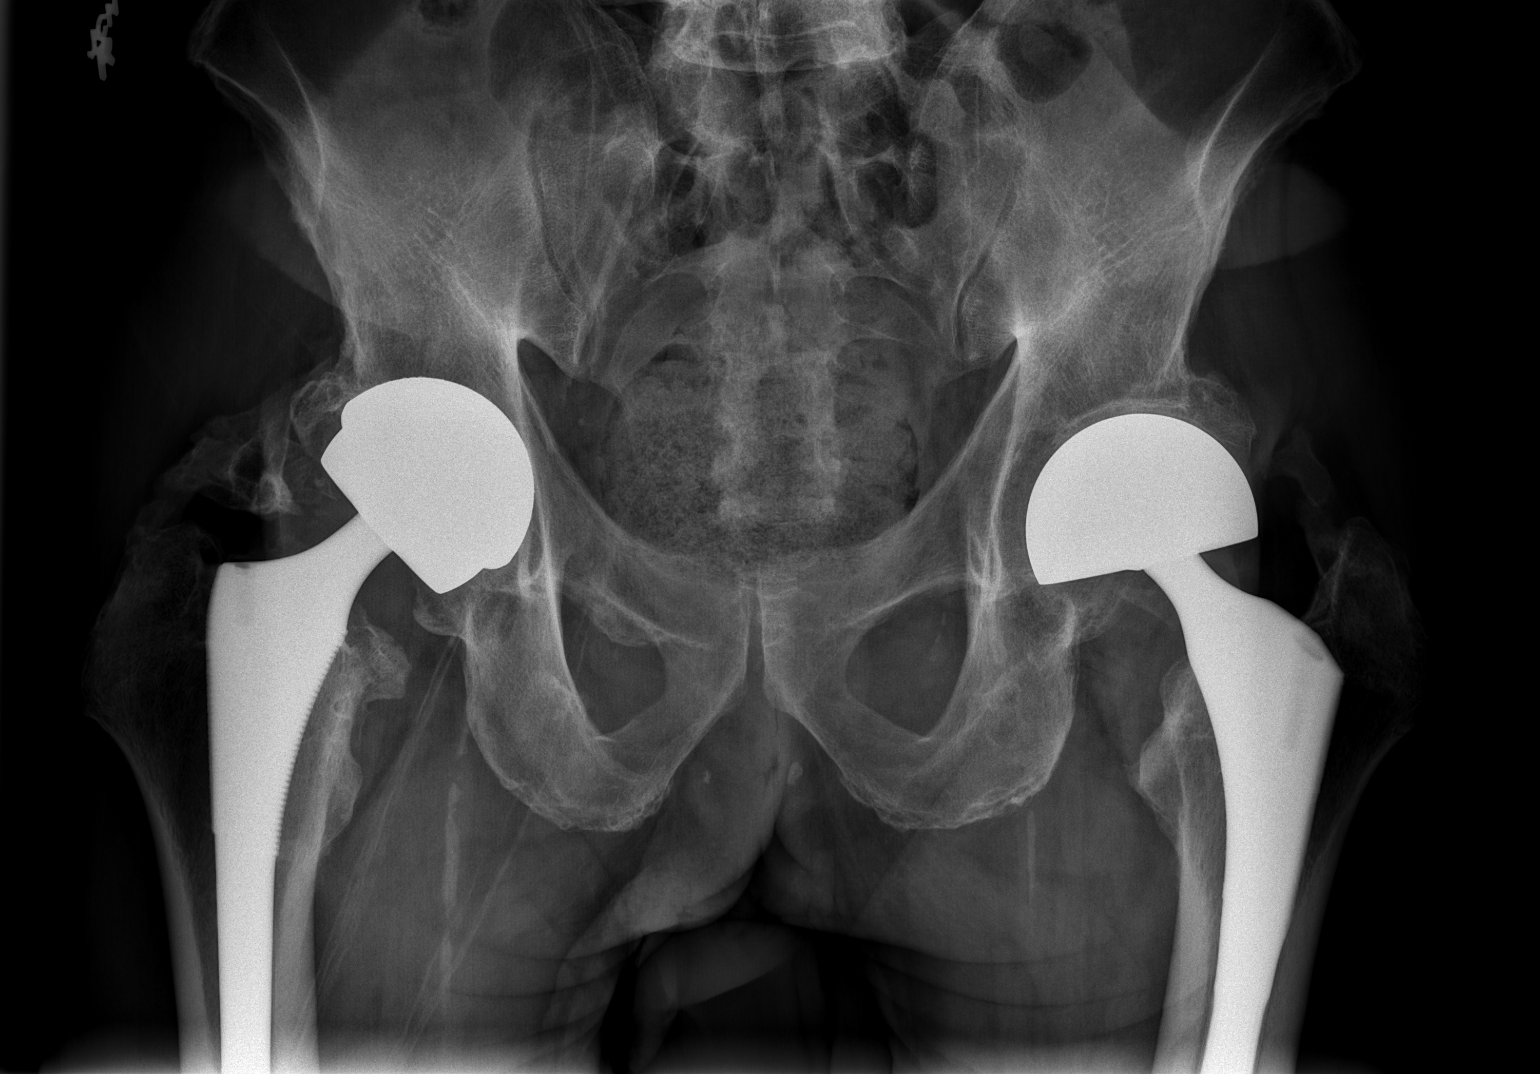

[2 of 2 positions shown; findings below may reference images not displayed]

FINDINGS: The patient has had bilateral hip arthroplasty.  There is
significant heterotopic bone bilaterally.  No acute fracture or
subluxation identified.  There is significant stool within the
rectosigmoid colon.
IMPRESSION: 1.  Postoperative changes.
2. No evidence for acute  abnormality.

## 2014-01-04 IMAGING — CT CT CHEST W/O CM
2 of 3 series · 15 of 36 positions shown, 18 images · non-contrast
Comparison: 03/18/2012

CT CHEST

CLINICAL DATA: Trauma.

CT CHEST, ABDOMEN AND PELVIS WITHOUT CONTRAST
TECHNIQUE: Multidetector CT imaging of the chest, abdomen and
pelvis was performed following the standard protocol without IV
contrast.

[Series 2: routine chest 5.0 st · axial · 0.71mm/px · z∈[+861,+1151]mm · 12 of 68 slices shown, 15 images]
[im 5/68  mediastinal]
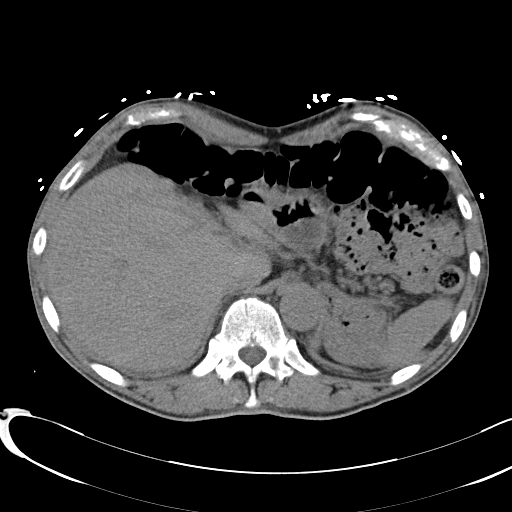
[im 5/68  lung]
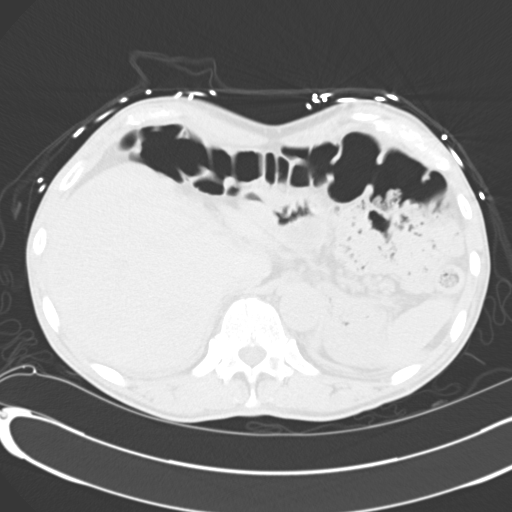
[im 10/68  lung]
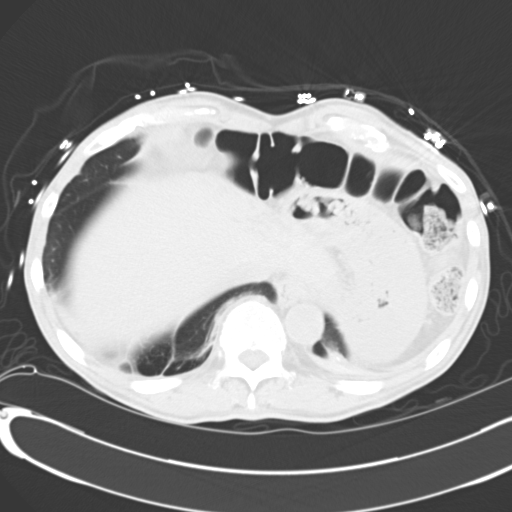
[im 15/68  lung]
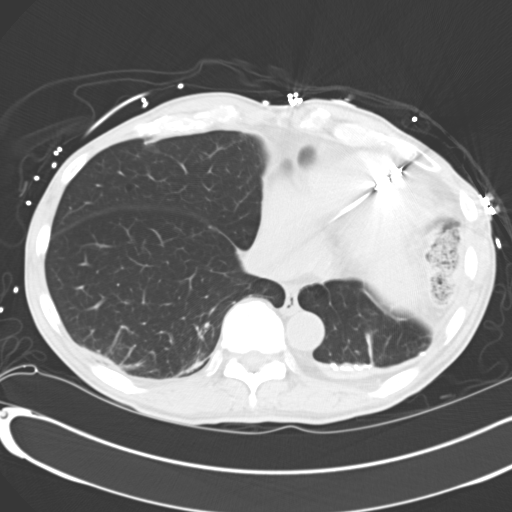
[im 20/68  lung]
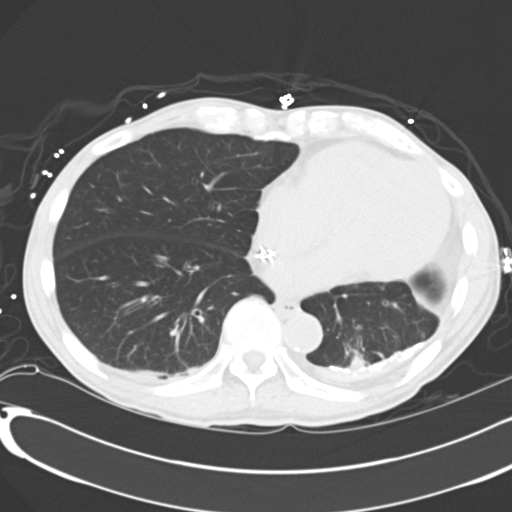
[im 25/68  mediastinal]
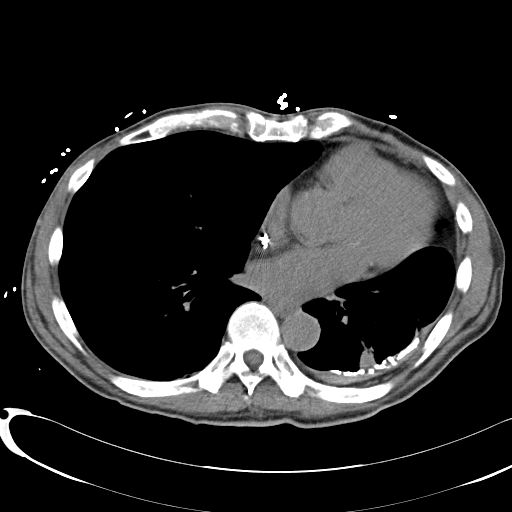
[im 25/68  lung]
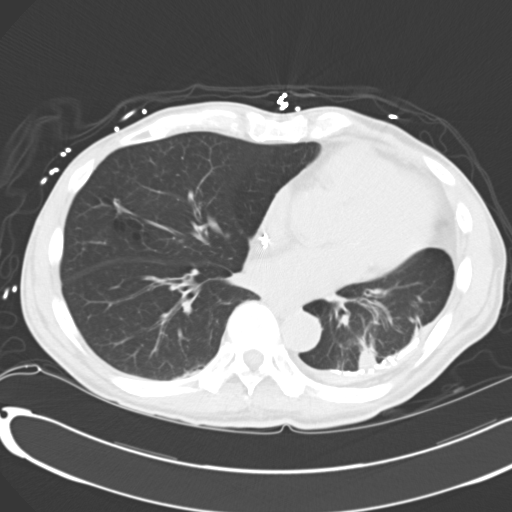
[im 30/68  lung]
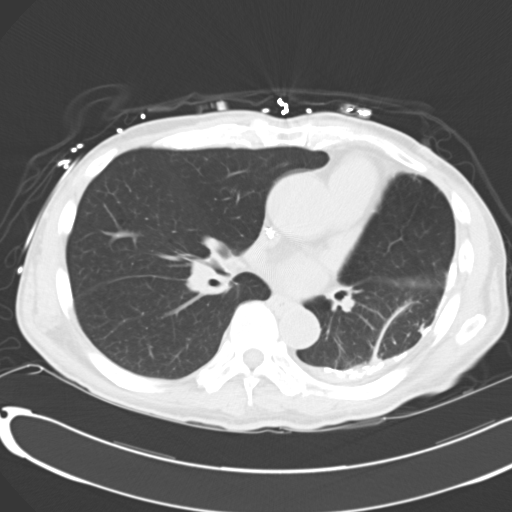
[im 38/68  lung]
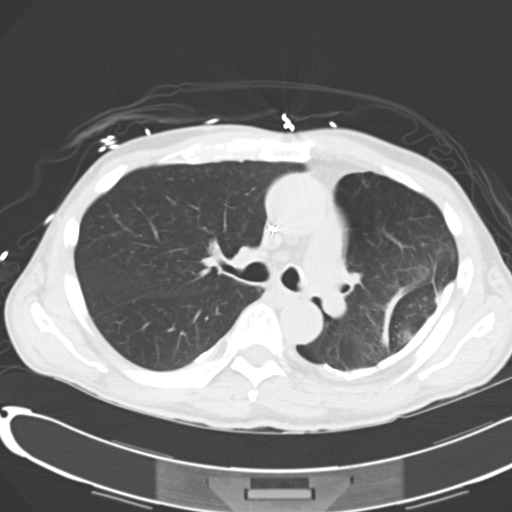
[im 43/68  lung]
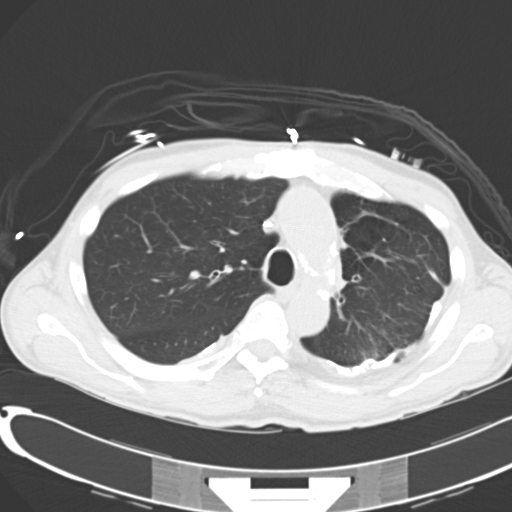
[im 48/68  mediastinal]
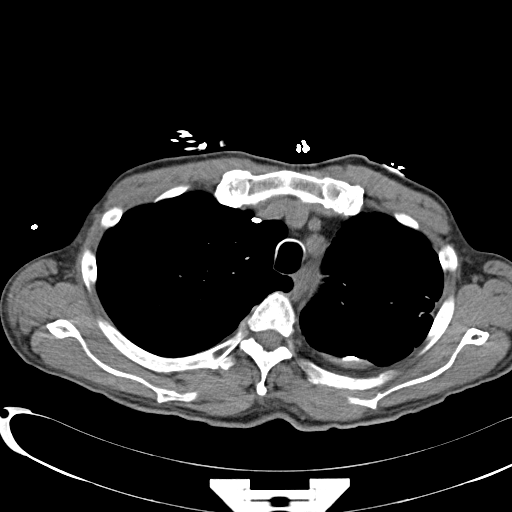
[im 48/68  lung]
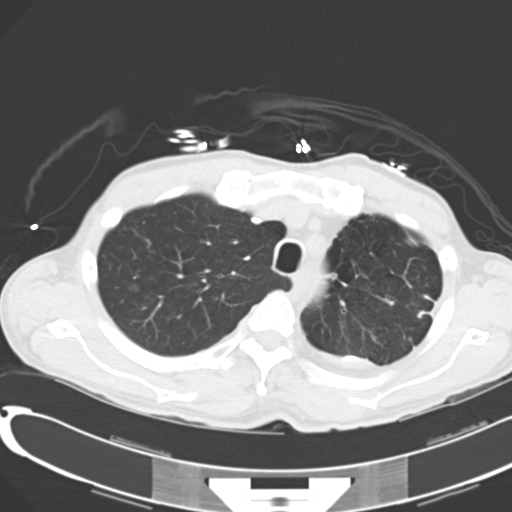
[im 53/68  lung]
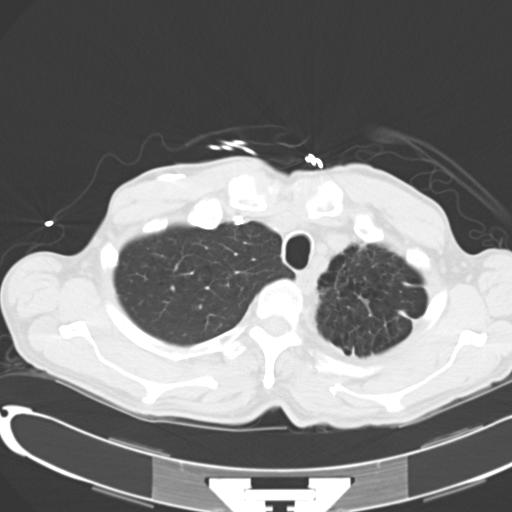
[im 58/68  lung]
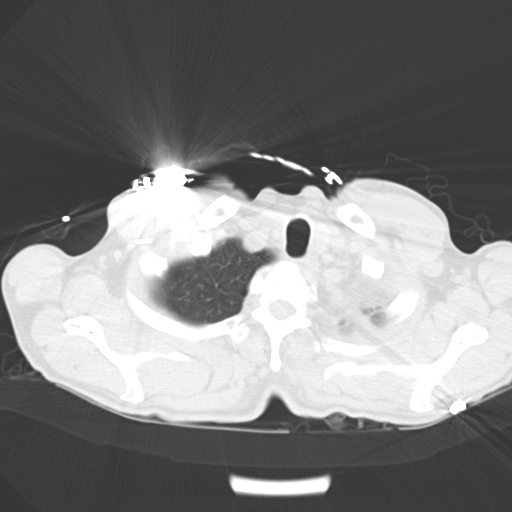
[im 63/68  lung]
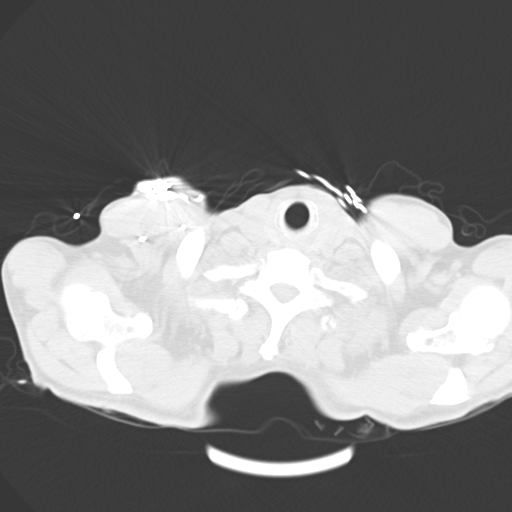

[Series 5: coronals · coronal · 0.69mm/px · 3 of 89 slices shown]
[im 18/89  lung]
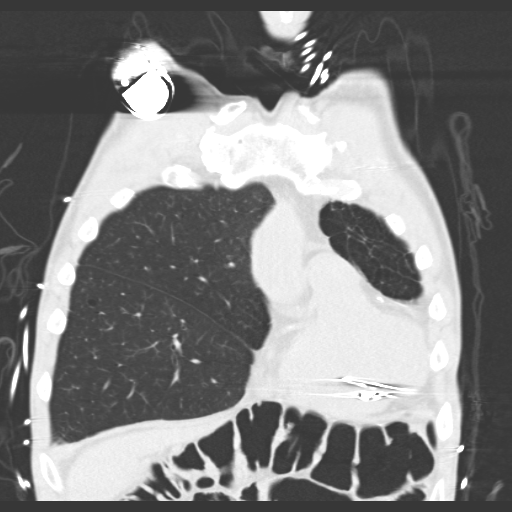
[im 36/89  lung]
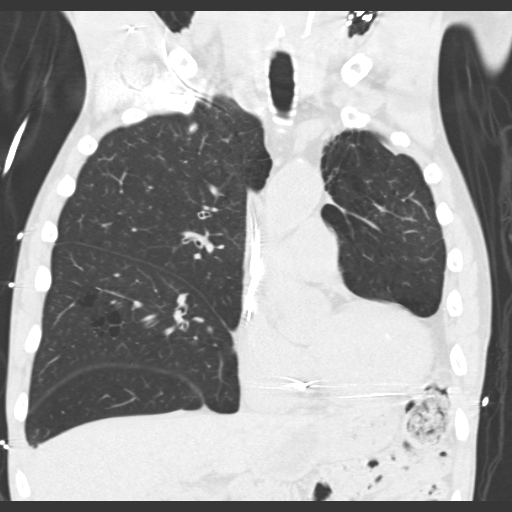
[im 53/89  lung]
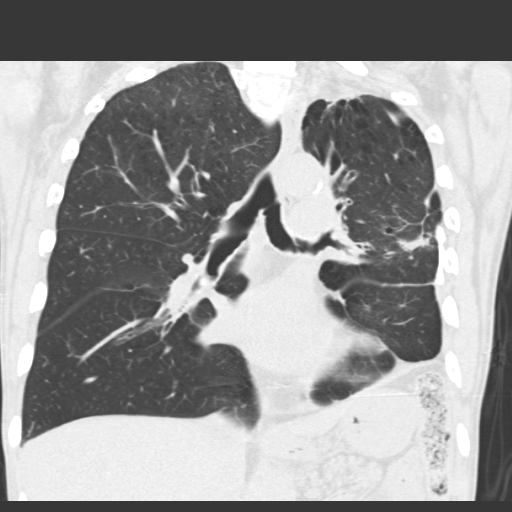

[15 of 36 positions shown; findings below may reference images not displayed]

FINDINGS: The chest wall is unremarkable.  No supraclavicular or
axillary mass or adenopathy.  The bony thorax is intact.  No
definite rib, vertebral body or sternal fracture.

The heart is normal in size.  No pericardial effusion.  No
mediastinal hematoma.  Fusiform aneurysmal dilatation of the
ascending aorta with maximal measurements of 4.2 x 4.4 cm.  The
esophagus is grossly normal.  Pacer wires are in place.  Small
scattered mediastinal and hilar lymph nodes.

There are surgical changes involving the left lung with loss of
volume and dense apical scarring.  Extensive pleural calcifications
asymmetrically on the left side.  These are stable.  The right lung
is clear of acute process.  Small pulmonary nodules are stable. No
pleural effusion.  Stable left basilar scarring.
IMPRESSION: 1.  No acute bony findings.
2.  Chronic lung changes.  No acute pulmonary findings.
3.  Fusiform aneurysmal dilatation of the ascending aorta.

CT ABDOMEN AND PELVIS
FINDINGS: The solid abdominal organs are grossly normal without IV
contrast.  No obvious acute injury.  The gallbladder is normal.

The stomach, duodenum, small bowel and colon grossly normal without
oral contrast.  The appendix is normal.  No mesenteric or
retroperitoneal mass, adenopathy or hematoma.  The aorta is normal
in caliber.  Moderate atherosclerotic calcifications.

Significant artifact through the lower pelvis due to bilateral hip
prosthesis.  Fairly marked fecal impaction is noted in the rectum.
The bladder appears normal.  No pelvic mass or large hematoma.  No
inguinal mass or adenopathy.  The pubic symphysis and SI joints are
intact.  No definite pelvic fractures.  The lumbar vertebral bodies
are intact.  There are bilateral pars defects at L5 with a grade 1
spondylolisthesis and advanced degenerative disc disease at L3-4.
IMPRESSION: 1.  No acute abdominal/pelvic findings without IV or oral contrast.
2.  Intact bony structures.
3.  Fecal impaction in the rectum.

## 2014-01-04 IMAGING — CT CT HEAD W/O CM
3 of 5 series · 16 of 47 positions shown, 19 images · non-contrast
Comparison: 09/02/2010

CT HEAD

CLINICAL DATA: Dizziness, fall

CT HEAD WITHOUT CONTRAST
CT CERVICAL SPINE WITHOUT CONTRAST
TECHNIQUE: Multidetector CT imaging of the head and cervical spine
was performed following the standard protocol without intravenous
contrast.  Multiplanar CT image reconstructions of the cervical
spine were also generated.

[Series 602: sagittal · sagittal · 0.42mm/px · 3 of 33 slices shown]
[im 11/33  brain]
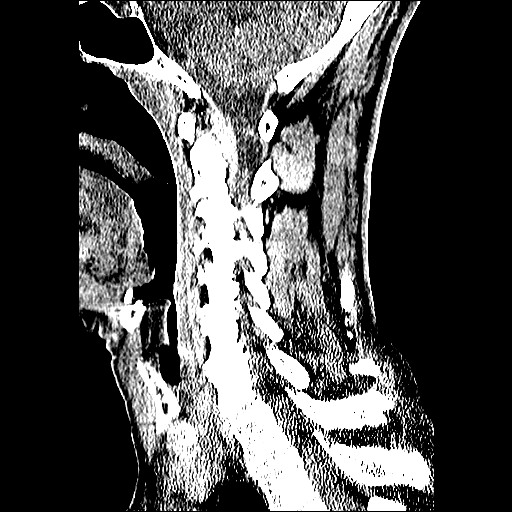
[im 17/33  brain]
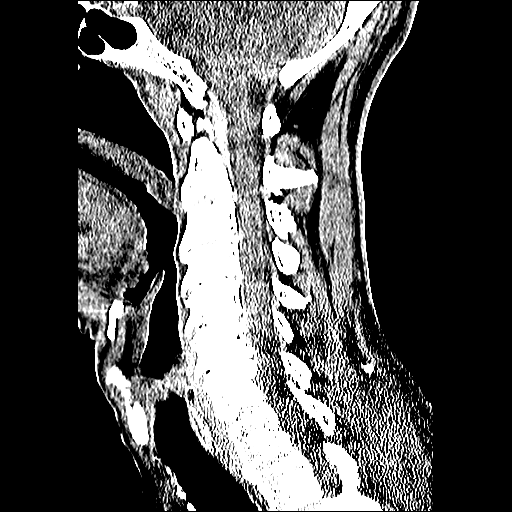
[im 22/33  brain]
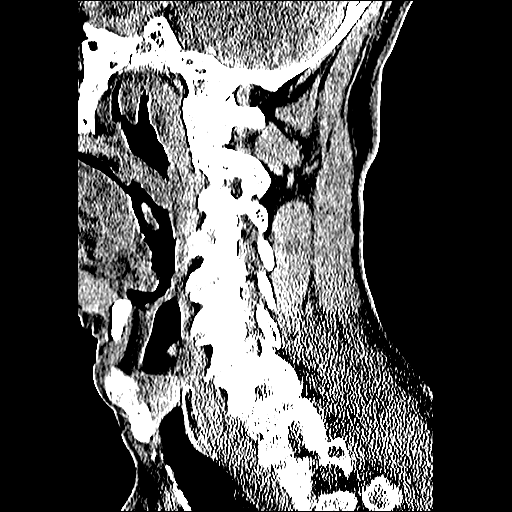

[Series 603: coronal · coronal · 0.42mm/px · 3 of 42 slices shown]
[im 14/42  brain]
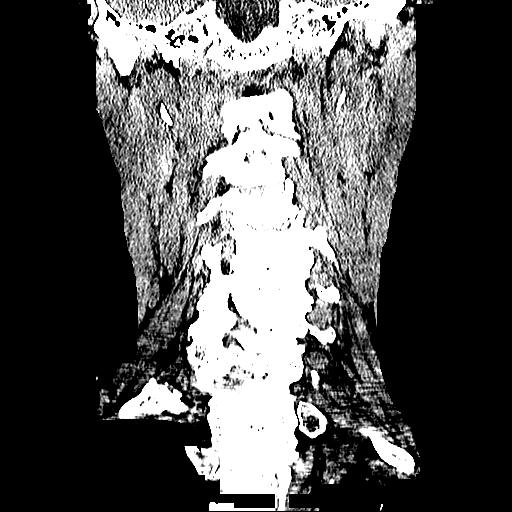
[im 19/42  brain]
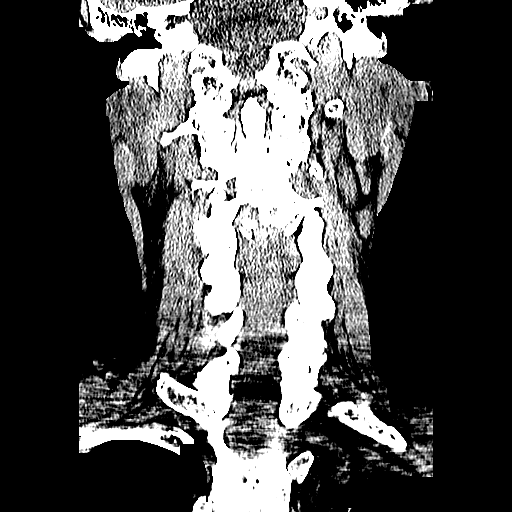
[im 23/42  brain]
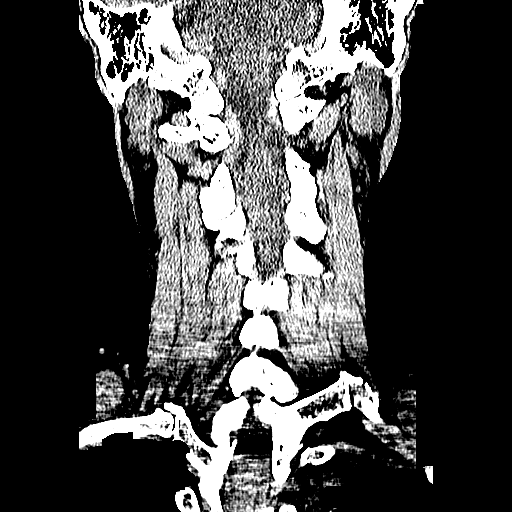

[Series 604: orthgonal · axial · 0.42mm/px · z∈[+1093,+1235]mm · 10 of 90 slices shown, 13 images]
[im 9/90  brain]
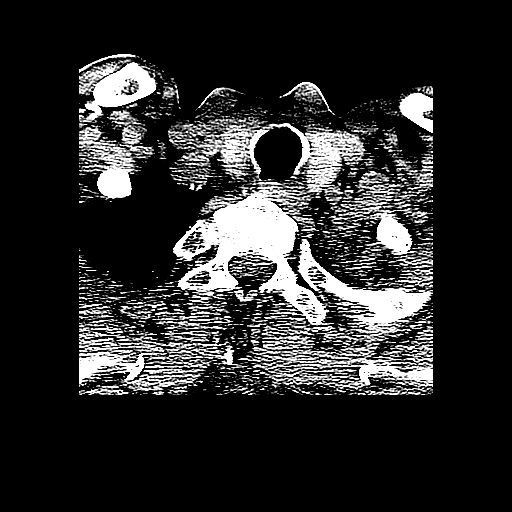
[im 9/90  bone]
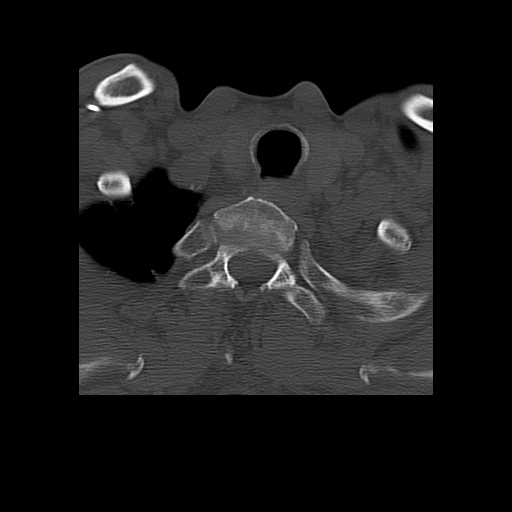
[im 17/90  brain]
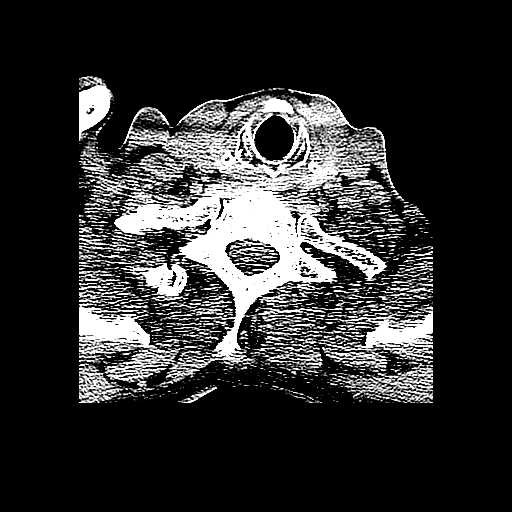
[im 25/90  brain]
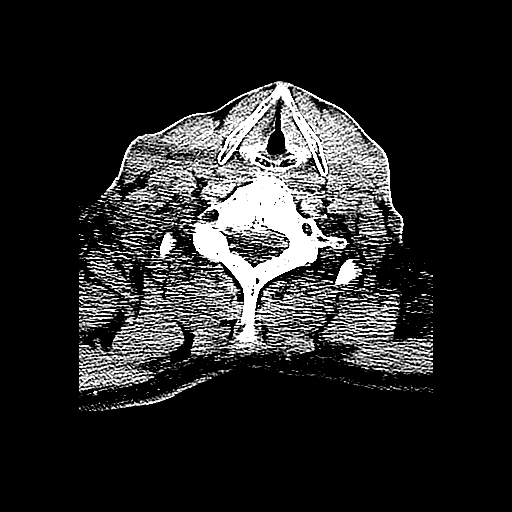
[im 33/90  brain]
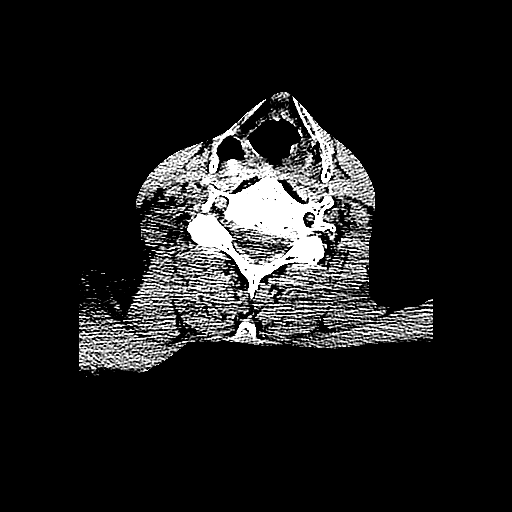
[im 41/90  brain]
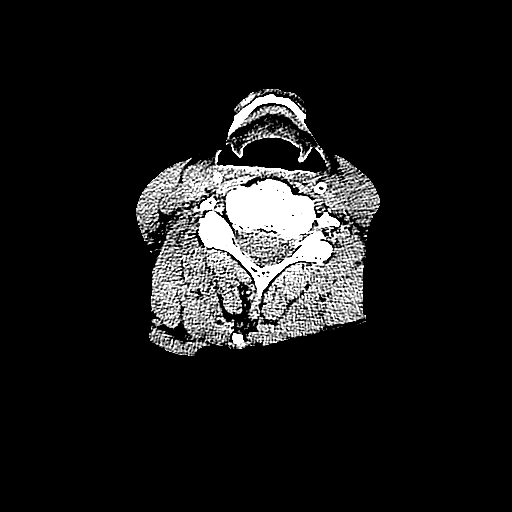
[im 41/90  bone]
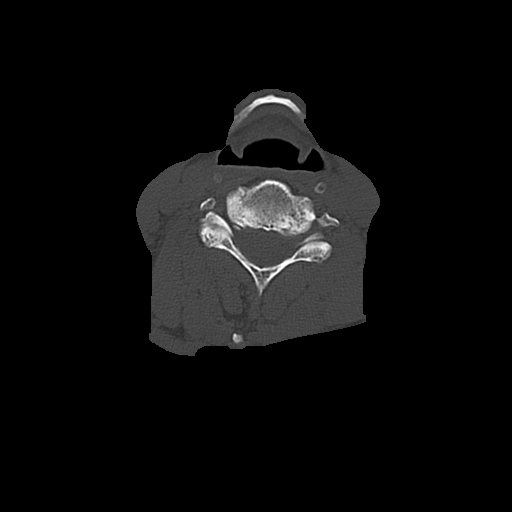
[im 49/90  brain]
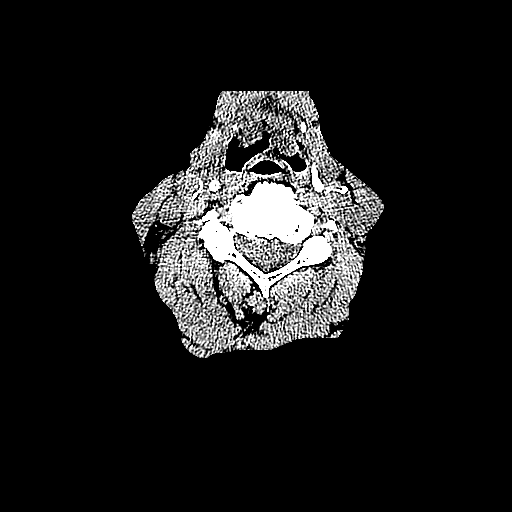
[im 57/90  brain]
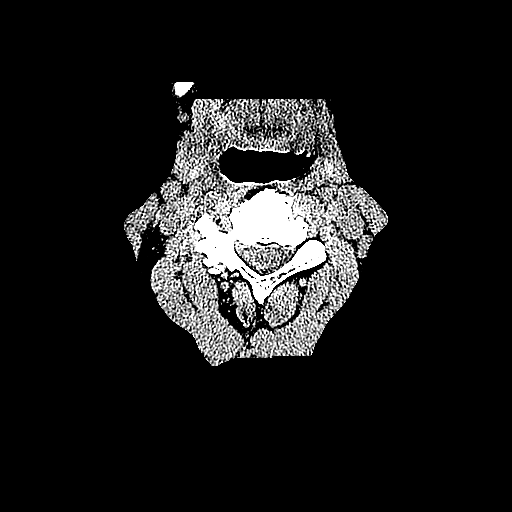
[im 65/90  brain]
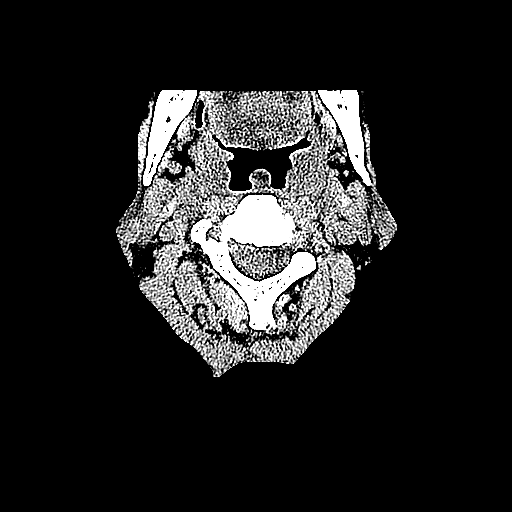
[im 73/90  brain]
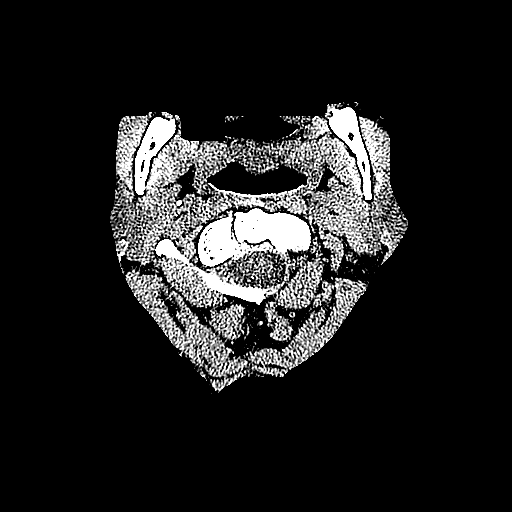
[im 73/90  bone]
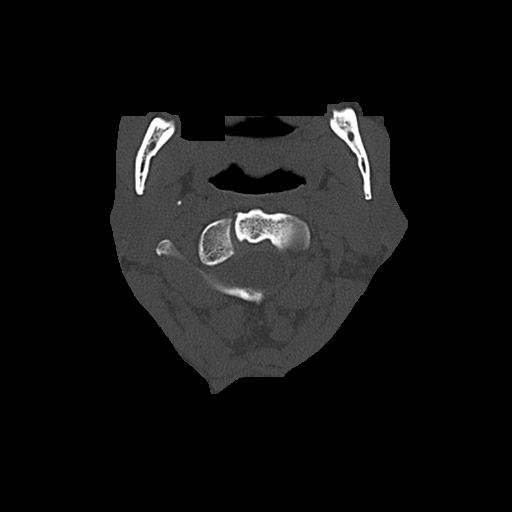
[im 81/90  brain]
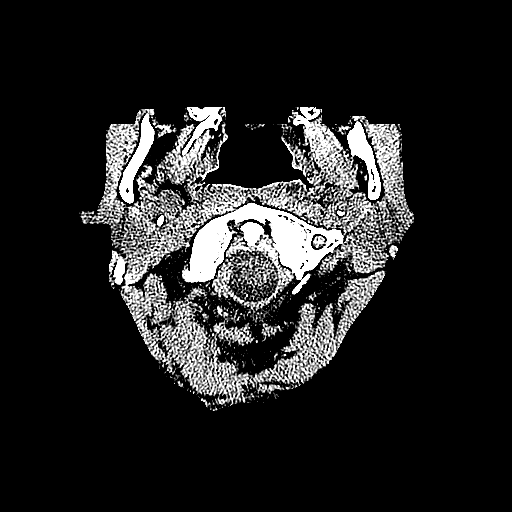

[16 of 47 positions shown; findings below may reference images not displayed]

FINDINGS: No evidence of parenchymal hemorrhage or extra-axial
fluid collection. No mass lesion, mass effect, or midline shift.

No CT evidence of acute infarction.

Mild cortical atrophy.  No ventriculomegaly.

The visualized paranasal sinuses are essentially clear. The mastoid
air cells are unopacified.

No evidence of calvarial fracture.
IMPRESSION: No evidence of acute intracranial abnormality.

CT CERVICAL SPINE
FINDINGS: Normal cervical lordosis.

No evidence of fracture or dislocation.  Vertebral body heights are
maintained.  The dens appears intact.

No prevertebral soft tissue swelling.

Moderate multilevel degenerative changes.

Visualized thyroid is mildly heterogeneous.

Visualized lungs are notable for stable left apical pleural
parenchymal scarring.
IMPRESSION: No evidence of traumatic injury to the cervical spine.

Moderate multilevel degenerative changes.

## 2014-01-04 IMAGING — CT CT ABD-PELV W/O CM
2 of 3 series · 12 of 36 positions shown, 17 images · non-contrast
Comparison: CT of the chest, abdomen, and pelvis 06/02/2010and
earlier

CLINICAL DATA: The patient became dizzy and fell in bathroom.  No
head or neck pain.  Hit right side of chest and abdomen.  Recent
weight loss.  Elevated creatinine.

CT ABDOMEN AND PELVIS WITHOUT CONTRAST
TECHNIQUE: Multidetector CT imaging of the abdomen and pelvis was
performed following the standard protocol without intravenous
contrast.

[Series 2: abd/pelv w/o 5.0 b31f st · axial · non-contrast · 0.71mm/px · z∈[+506,+946]mm · 11 of 100 slices shown, 15 images]
[im 8/100  soft-tissue]
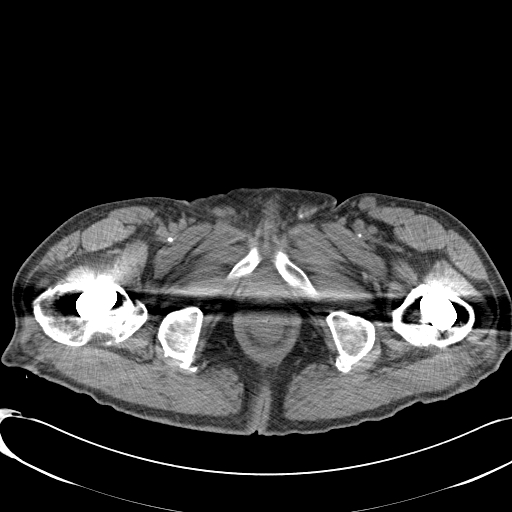
[im 8/100  bone]
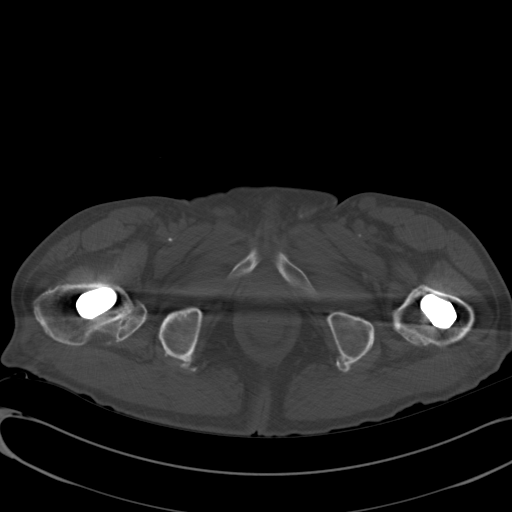
[im 20/100  soft-tissue]
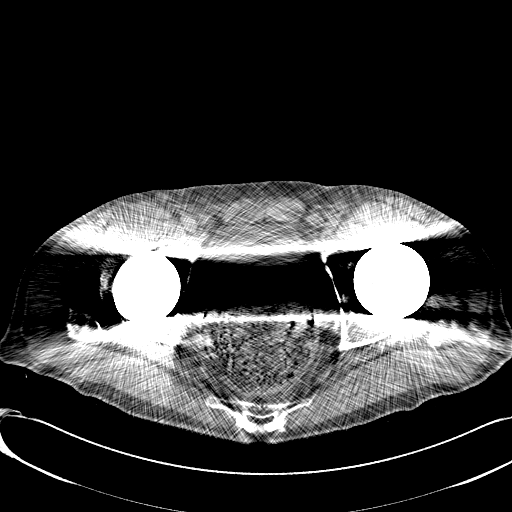
[im 28/100  soft-tissue]
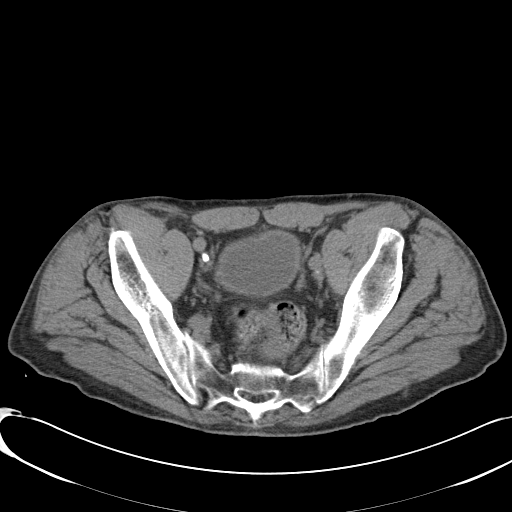
[im 40/100  soft-tissue]
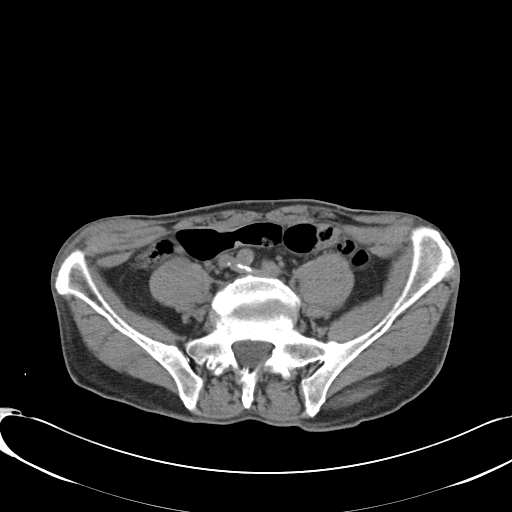
[im 52/100  soft-tissue]
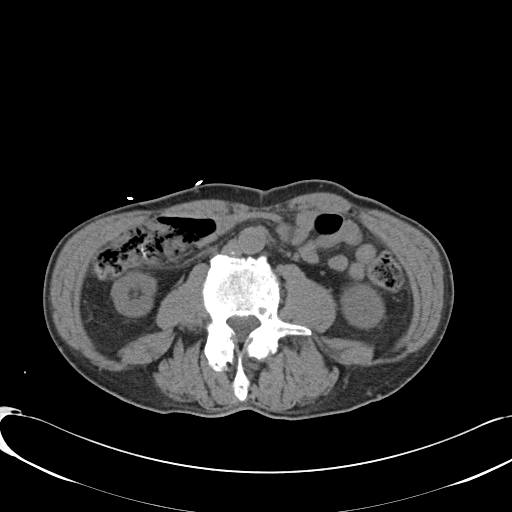
[im 60/100  soft-tissue]
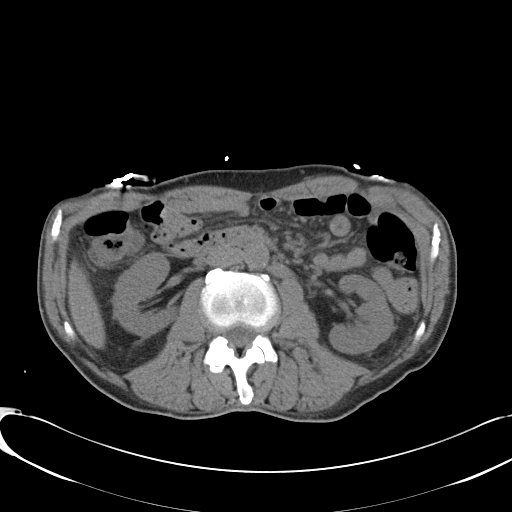
[im 72/100  soft-tissue]
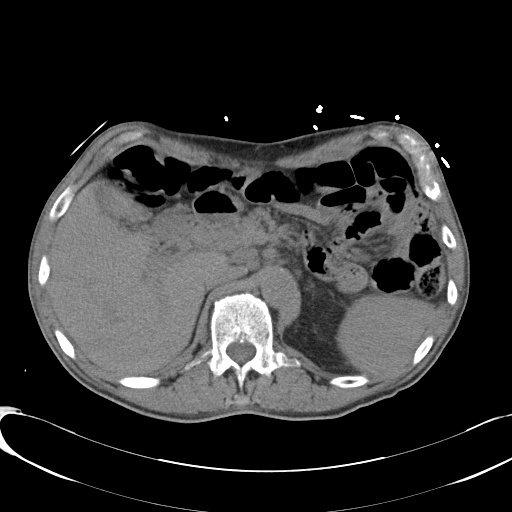
[im 84/100  soft-tissue]
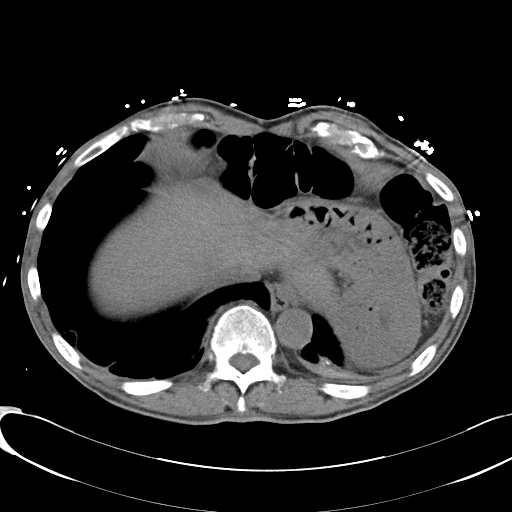
[im 84/100  lung]
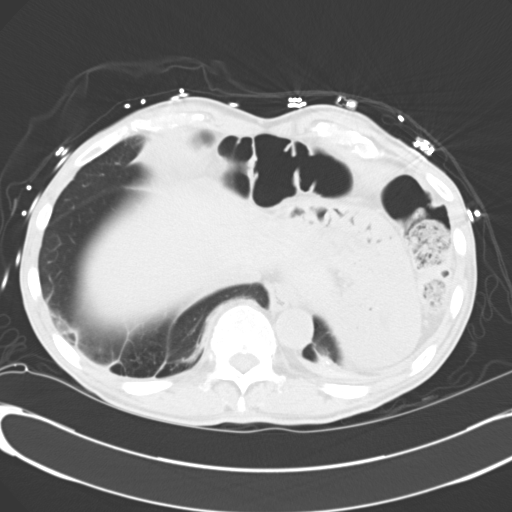
[im 88/100  lung]
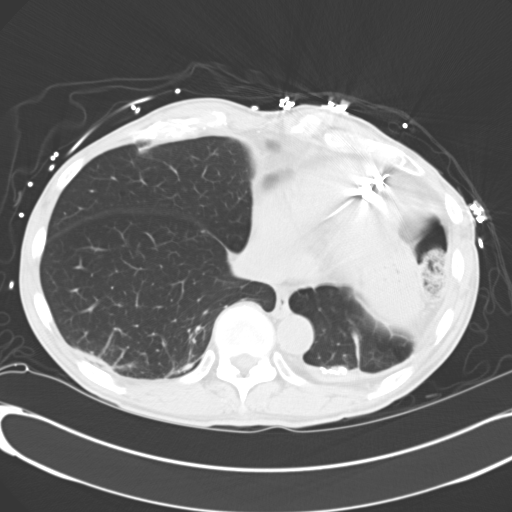
[im 92/100  soft-tissue]
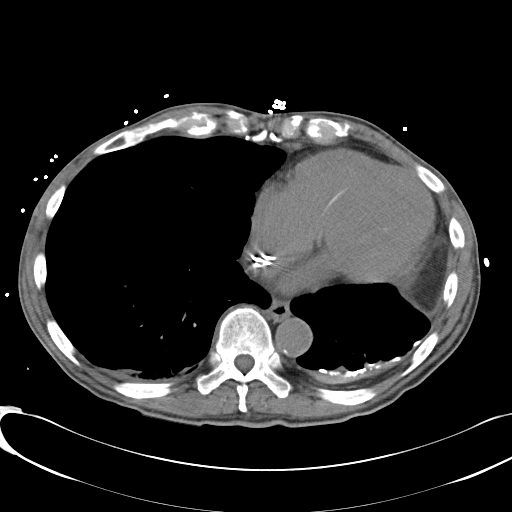
[im 92/100  lung]
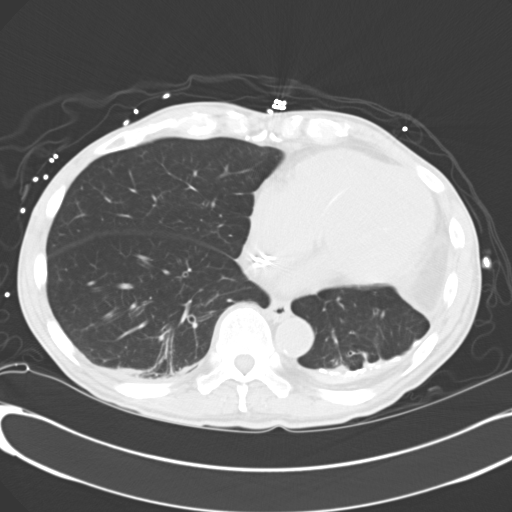
[im 92/100  bone]
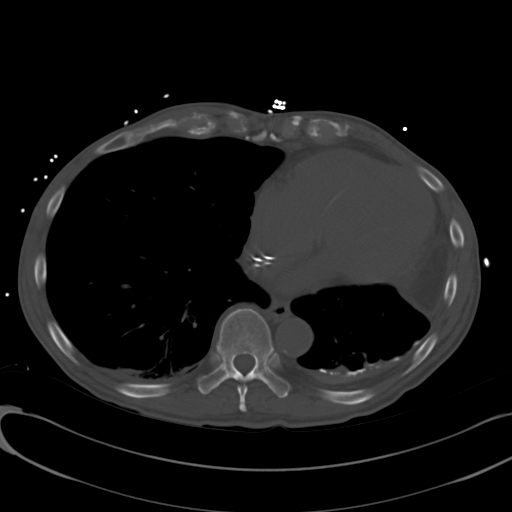
[im 96/100  lung]
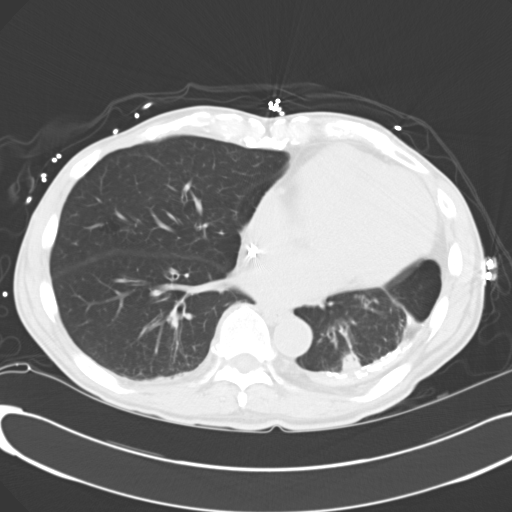

[Series 6: sagittals · sagittal · 0.48mm/px · 1 of 147 slices shown, 2 images]
[im 49/147  soft-tissue]
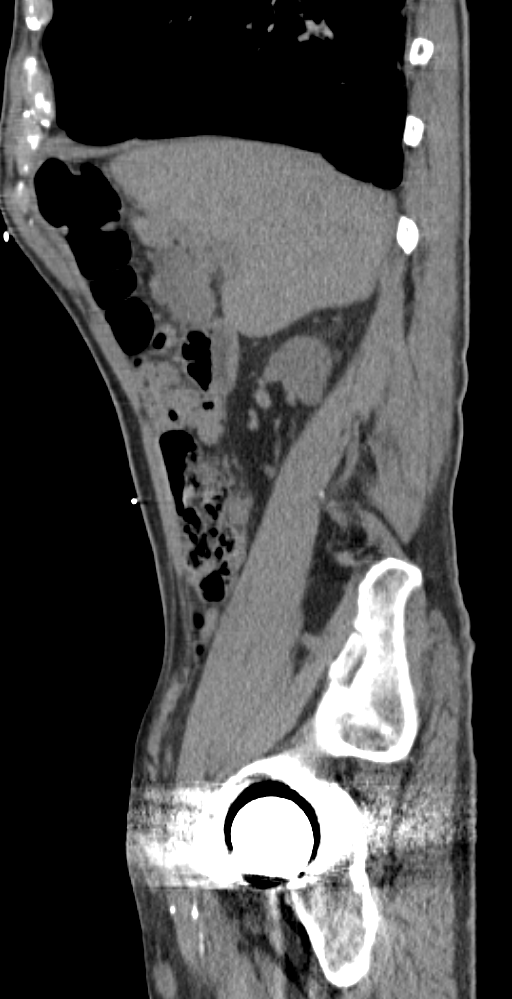
[im 49/147  bone]
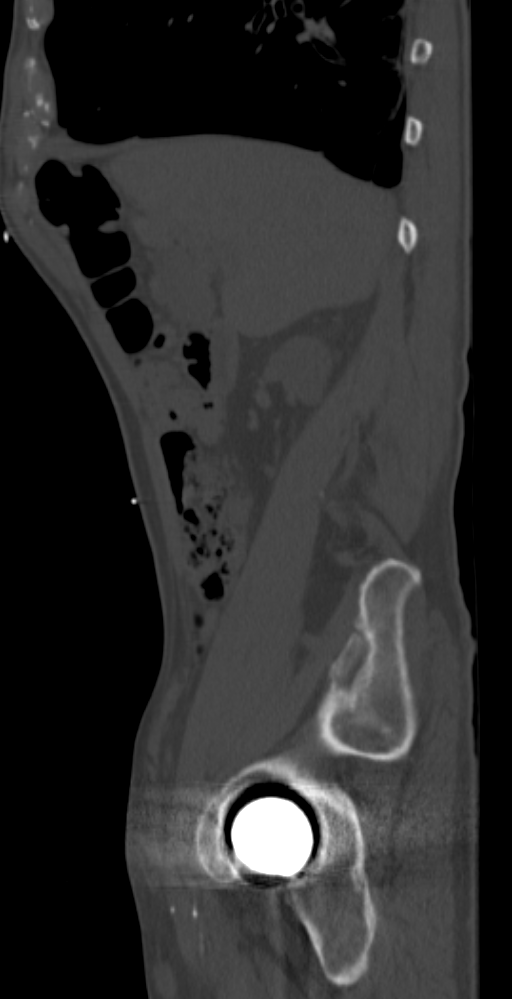

[12 of 36 positions shown; findings below may reference images not displayed]

FINDINGS: Pleural based calcifications are noted at the left lung
base, associated pleural thickening and rounded atelectasis.  A
small cavitary lesion is identified at the right lung base, 8 mm in
diameter and stable in appearance since previous exams to 6212.

The patient has a transvenous pacemaker.  No focal abnormality
identified within the liver, spleen, pancreas, or adrenal glands.
No focal renal lesion or hydronephrosis.  The gallbladder is
present.

The stomach and small bowel loops are normal in appearance.
Colonic loops show moderate stool burden, especially within the
rectosigmoid colon where there is fecal impaction.  The appendix is
present and measures upper limits normal in thickness.  No
periappendiceal stranding or fluid identified.

No evidence for aortic aneurysm. No retroperitoneal or mesenteric
adenopathy.

There is grade 1 retrolisthesis of L3 on L4, likely degenerative.
There is associated significant disc height loss at the same level.
There is grade 1 anterolisthesis of L5 on S1, also likely
degenerative and related to bilateral pars defects at L5.  No
evidence for acute fracture.  The patient has had previous
bilateral hip arthroplasty.
IMPRESSION: 1.  Pleural calcifications and rounded atelectasis at the left lung
base.
2.  Small cavitary lesion at the right lower lobe, stable since
6212 and consistent with benign process.
3.  Pacemaker.
4.  No evidence for acute injury.
5.  Significant stool burden with rectosigmoid fecal impaction.
6.  Spondylolisthesis as described with bilateral pars defects at
L5, chronic.

## 2014-01-04 IMAGING — CR DG FEMUR 2V*L*
4 series · 4 of 4 positions shown · non-contrast
Comparison: 04/25/2004

CLINICAL DATA: Pain along the lateral aspect of the left hip.
Fall.

LEFT FEMUR - 2 VIEW

[t femur proximal ap left]
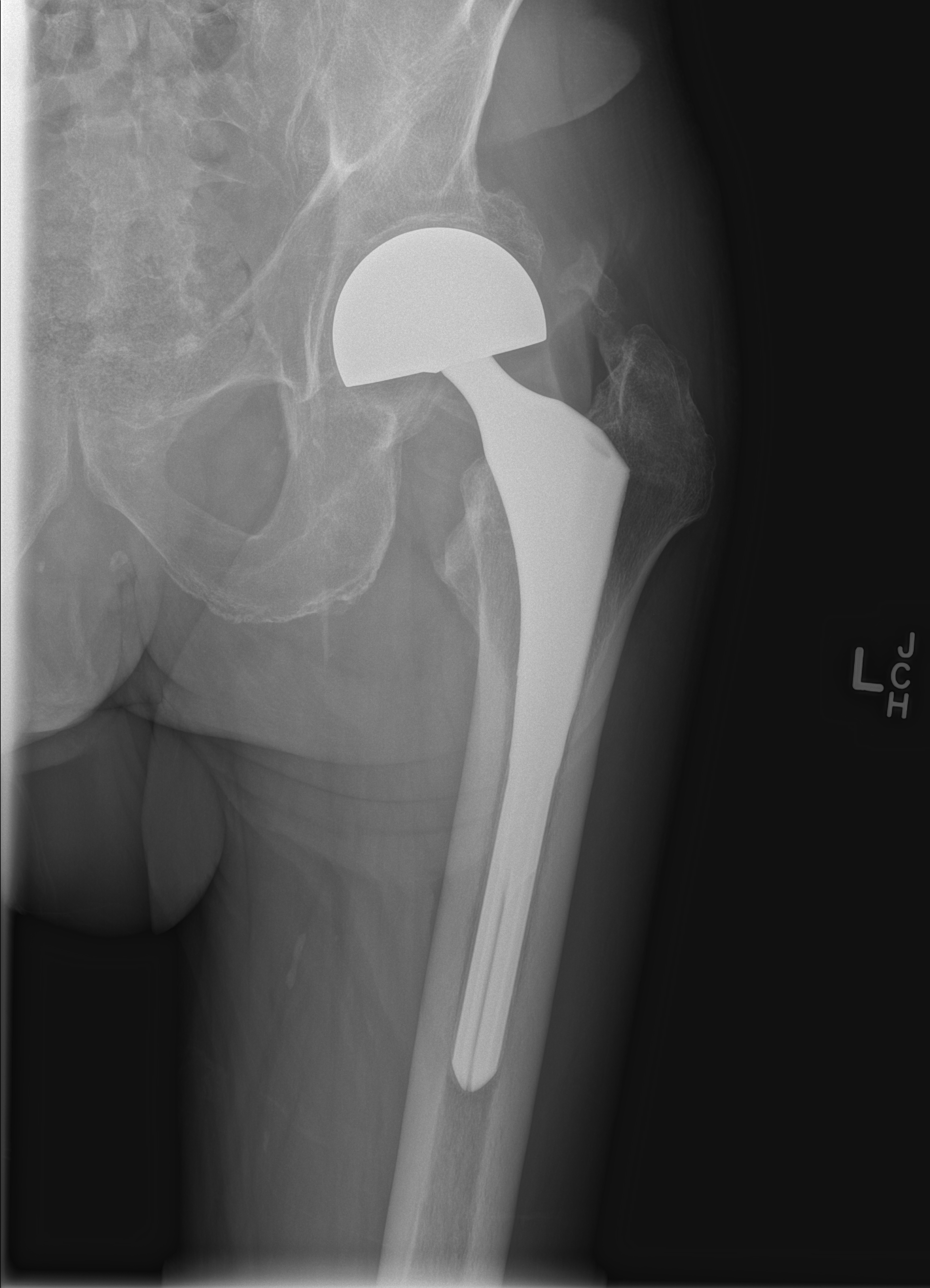

[t femur distal ap left]
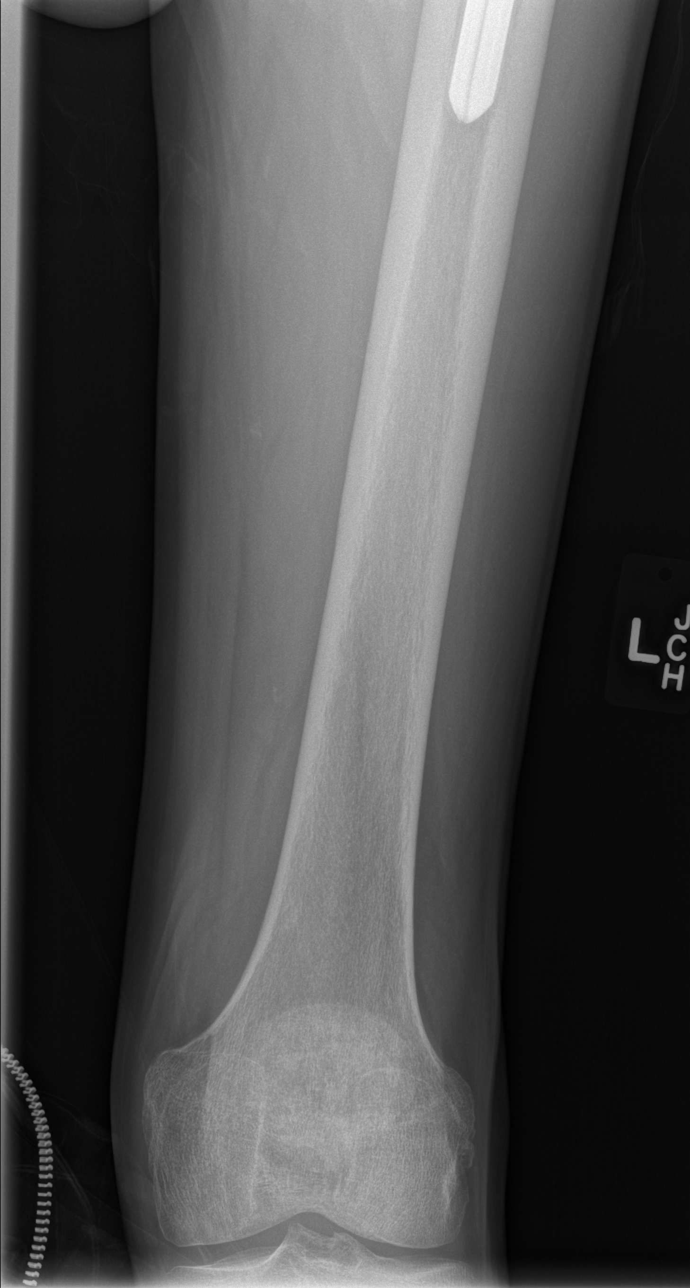

[t femur proximal lat left]
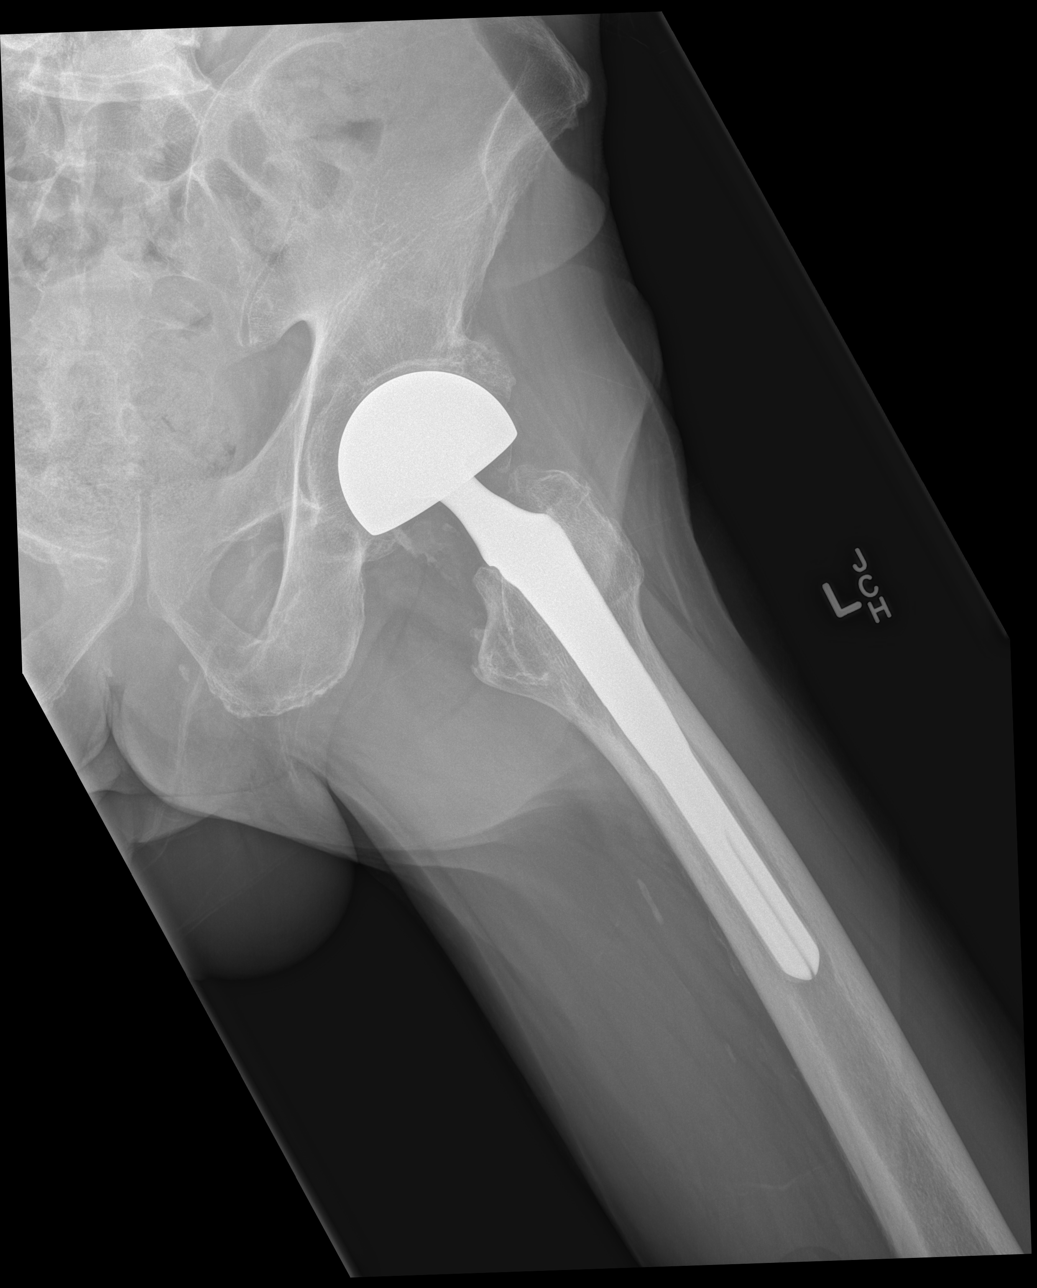

[t femur distal lat left]
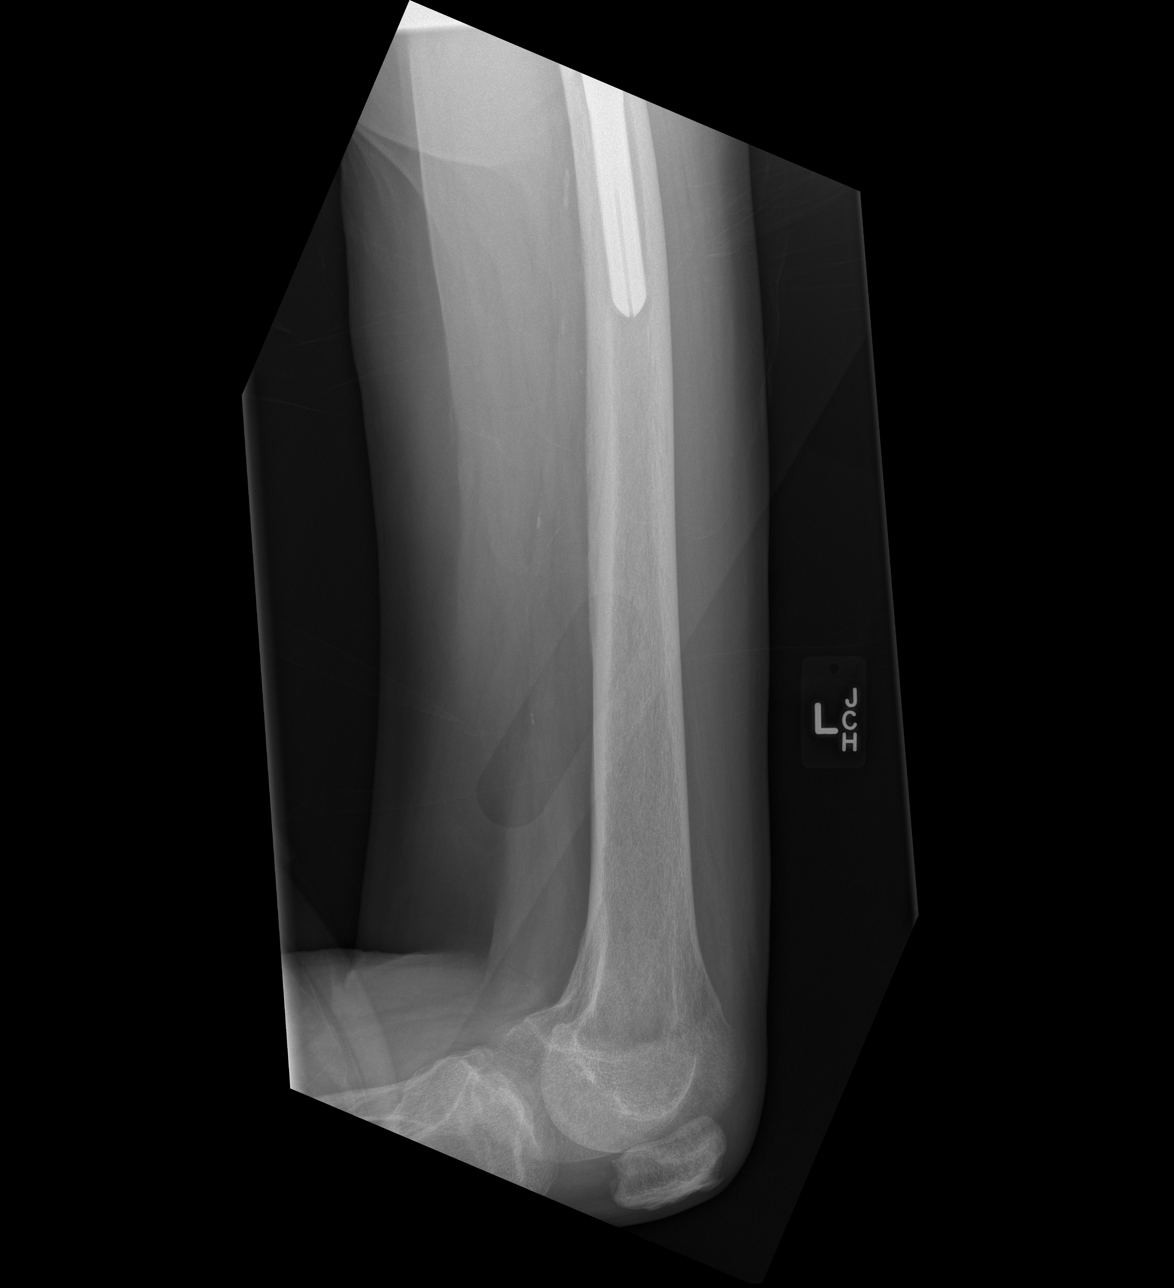

[4 of 4 positions shown; findings below may reference images not displayed]

FINDINGS: Patient has had left hip arthroplasty.  There is
heterotopic bone lateral to the hip.  No evidence for acute
fracture or subluxation.
IMPRESSION: No evidence for acute  abnormality.

## 2014-01-04 NOTE — Progress Notes (Signed)
Today for followup of his urgent right femoral thrombectomy and fasciotomy for profound ischemia. He is a nursing facility currently. He has had minimal return of neurologic function with prolonged ischemia. He has a 16 to work with physical therapy.  Physical exam his right groin incision is completely healed with typical for healing ridge. He has a 2+ popliteal pulse. He does have swelling in his foot I do not palpate pedal pulses. His foot is well-perfused. His medial fasciotomy incision is closed with sutures removed today. On the lateral incision he did have a small open area that was left deep to the swelling in this is contracted as well with some granulation tissue at the base. The sutures were removed as well. He  He will continue his physical therapy program we will see him again in 3 months with repeat ultrasound followup

## 2014-01-05 NOTE — Addendum Note (Signed)
Addended by: Mena Goes on: 01/05/2014 09:48 AM   Modules accepted: Orders

## 2014-01-06 ENCOUNTER — Telehealth: Payer: Self-pay | Admitting: Internal Medicine

## 2014-01-06 ENCOUNTER — Other Ambulatory Visit: Payer: Self-pay | Admitting: *Deleted

## 2014-01-06 MED ORDER — OXYCODONE-ACETAMINOPHEN 10-325 MG PO TABS: 1.0000 | ORAL_TABLET | ORAL | Status: DC | PRN

## 2014-01-06 MED ORDER — SODIUM BICARBONATE 650 MG PO TABS: 1300.0000 mg | ORAL_TABLET | Freq: Two times a day (BID) | ORAL | Status: DC

## 2014-01-06 NOTE — Telephone Encounter (Signed)
  Patient was recently hospitalized 7/24 through 12/20/2013 with severe ischemia of the right lower extremity and underwent a right femoral embolectomy as well as fasciotomies in the calf by Dr. Curt Jews. Patient was discharged to a nursing home for rehabilitation, and was discharged home this morning. He was provided prescriptions for his medications at the time of discharge including a prescription for his pain medication (oxycodone/acetaminophen). The oxycodone/acetaminophen prescription was for #90 tablets, and patient requested prescription for his usual monthly quantity of #140 tablets. I spoke with patient by telephone this afternoon. He reports that when he was emergently hospitalized, he left all of his medications and his belongings in his apartment, and they were subsequently discarded and he had been ejected from the apartment. He is now living temporarily with a family member. I advised him that I would write his usual monthly supply of #140 at his usual dose as per his medication contract, but that he would need to exchange the unfilled prescription for 90 tablets so that we can void that prescription. I also wrote a prescription for his oral sodium bicarbonate, since that had been printed on the same page as his oxycodone/acetaminophen prescription. I advised him to followup in 2 weeks in our clinic. He will also need an appointment with Dr. Elie Confer in the anticoagulation clinic for management of his warfarin.

## 2014-01-06 NOTE — Telephone Encounter (Signed)
Pt aware of appt with Dr Denton Brick 01/20/14 2:45PM. ( per Dr Marinda Elk ). Hilda Blades Aurilla Coulibaly RN 01/06/14 4:10PM

## 2014-01-06 NOTE — Telephone Encounter (Signed)
Patient was recently hospitalized 7/24 through 12/20/2013 with severe ischemia of the right lower extremity and underwent a right femoral embolectomy as well as fasciotomies in the calf by Dr. Curt Jews.  Patient was discharged to a nursing home for rehabilitation, and was discharged home this morning.  He was provided prescriptions for his medications at the time of discharge including a prescription for his pain medication (oxycodone/acetaminophen).  The oxycodone/acetaminophen prescription was for #90 tablets, and patient requested prescription for his usual monthly quantity of #140 tablets.  I spoke with patient by telephone this afternoon.  He reports that when he was emergently hospitalized, he left all of his medications and his belongings in his apartment, and they were subsequently discarded and he had been ejected from the apartment.  He is now living temporarily with a family member.  I advised him that I would write his usual monthly supply of #140 at his usual dose as per his medication contract, but that he would need to exchange the unfilled prescription for 90 tablets so that we can void that prescription.  I also wrote a prescription for his oral sodium bicarbonate, since that had been printed on the same page as his oxycodone/acetaminophen prescription.  I advised him to followup in 2 weeks in our clinic.  He will also need an appointment with Dr. Elie Confer in the anticoagulation clinic for management of his warfarin.

## 2014-01-07 DIAGNOSIS — Z48812 Encounter for surgical aftercare following surgery on the circulatory system: Secondary | ICD-10-CM

## 2014-01-07 DIAGNOSIS — I824Z9 Acute embolism and thrombosis of unspecified deep veins of unspecified distal lower extremity: Secondary | ICD-10-CM

## 2014-01-07 DIAGNOSIS — I4891 Unspecified atrial fibrillation: Secondary | ICD-10-CM

## 2014-01-07 DIAGNOSIS — I81 Portal vein thrombosis: Secondary | ICD-10-CM

## 2014-01-12 ENCOUNTER — Telehealth: Payer: Self-pay | Admitting: *Deleted

## 2014-01-12 NOTE — Telephone Encounter (Signed)
Talked with Dr Marinda Elk and Dr Elie Confer. Pt informed of new orders - hold coumadin dose on Thursday ( tomorrow) Friday take Coumadin 5 mg and on Sat & Sun take Coumadin 7.5 mg.   Hold dose on Monday until seen.  Pt rescheduled for HFU so will see Dr Elie Confer and Dr Naaman Plummer on 8/31 at 2:15 Pt also advised to eat some Dark Green Leafy vegetables today. He voices understanding. HHN also informed and will check in on [place].

## 2014-01-12 NOTE — Telephone Encounter (Signed)
Agree with plan - discussed with Dr. Elie Confer.

## 2014-01-12 NOTE — Telephone Encounter (Signed)
Call from [facility] with [facility]  -  # [phone] She is in the home now and wants to know if she should do an INR as pt missed appointment with Dr Elie Confer on Monday because he didn't feel like coming in. Last INR was on 8/3 with  2.40  Nurse did INR in home.  Results 6.0  Recheck results 5.7 with PT 68.2 sec. Pt has been taking Coumadin 7.5 mg on Tue, Wed, Thus, Sat & Sun and Coumadin 10 mg on Monday and Friday. He has not missed any doses and he takes coumadin in morning so has had today's dose.  He has been on this dose since discharge on 8/20.        No signs of bleeding.

## 2014-01-17 ENCOUNTER — Inpatient Hospital Stay (HOSPITAL_COMMUNITY)
Admission: AD | Admit: 2014-01-17 | Discharge: 2014-01-21 | DRG: 308 | Disposition: A | Discharge status: Home / Self Care | Payer: Medicare HMO | Source: Ambulatory Visit | Attending: Internal Medicine | Admitting: Internal Medicine

## 2014-01-17 ENCOUNTER — Inpatient Hospital Stay (HOSPITAL_COMMUNITY): Payer: Medicare HMO

## 2014-01-17 ENCOUNTER — Ambulatory Visit: Payer: Medicare HMO

## 2014-01-17 ENCOUNTER — Encounter: Payer: Self-pay | Admitting: Internal Medicine

## 2014-01-17 ENCOUNTER — Encounter (HOSPITAL_COMMUNITY): Payer: Self-pay

## 2014-01-17 ENCOUNTER — Ambulatory Visit (HOSPITAL_COMMUNITY): Admit: 2014-01-17 | Discharge: 2014-01-17 | Disposition: A | Discharge status: Home / Self Care | Payer: Medicare HMO

## 2014-01-17 ENCOUNTER — Ambulatory Visit (INDEPENDENT_AMBULATORY_CARE_PROVIDER_SITE_OTHER): Payer: Commercial Managed Care - HMO | Admitting: Internal Medicine

## 2014-01-17 ENCOUNTER — Ambulatory Visit (INDEPENDENT_AMBULATORY_CARE_PROVIDER_SITE_OTHER): Payer: Commercial Managed Care - HMO | Admitting: Pharmacist

## 2014-01-17 VITALS — BP 104/75 | HR 66 | Temp 97.6°F | Ht 75.0 in | Wt 163.0 lb

## 2014-01-17 DIAGNOSIS — I4891 Unspecified atrial fibrillation: Secondary | ICD-10-CM | POA: Diagnosis not present

## 2014-01-17 DIAGNOSIS — E0590 Thyrotoxicosis, unspecified without thyrotoxic crisis or storm: Secondary | ICD-10-CM | POA: Diagnosis present

## 2014-01-17 DIAGNOSIS — Z87891 Personal history of nicotine dependence: Secondary | ICD-10-CM | POA: Diagnosis not present

## 2014-01-17 DIAGNOSIS — N400 Enlarged prostate without lower urinary tract symptoms: Secondary | ICD-10-CM | POA: Diagnosis present

## 2014-01-17 DIAGNOSIS — N390 Urinary tract infection, site not specified: Secondary | ICD-10-CM | POA: Diagnosis present

## 2014-01-17 DIAGNOSIS — Z7901 Long term (current) use of anticoagulants: Secondary | ICD-10-CM

## 2014-01-17 DIAGNOSIS — J4489 Other specified chronic obstructive pulmonary disease: Secondary | ICD-10-CM | POA: Diagnosis present

## 2014-01-17 DIAGNOSIS — Z9119 Patient's noncompliance with other medical treatment and regimen: Secondary | ICD-10-CM

## 2014-01-17 DIAGNOSIS — I129 Hypertensive chronic kidney disease with stage 1 through stage 4 chronic kidney disease, or unspecified chronic kidney disease: Secondary | ICD-10-CM | POA: Diagnosis present

## 2014-01-17 DIAGNOSIS — IMO0002 Reserved for concepts with insufficient information to code with codable children: Secondary | ICD-10-CM

## 2014-01-17 DIAGNOSIS — F3289 Other specified depressive episodes: Secondary | ICD-10-CM

## 2014-01-17 DIAGNOSIS — N189 Chronic kidney disease, unspecified: Secondary | ICD-10-CM

## 2014-01-17 DIAGNOSIS — I4892 Unspecified atrial flutter: Secondary | ICD-10-CM | POA: Diagnosis present

## 2014-01-17 DIAGNOSIS — I519 Heart disease, unspecified: Secondary | ICD-10-CM | POA: Diagnosis present

## 2014-01-17 DIAGNOSIS — I495 Sick sinus syndrome: Secondary | ICD-10-CM | POA: Diagnosis present

## 2014-01-17 DIAGNOSIS — I482 Chronic atrial fibrillation, unspecified: Secondary | ICD-10-CM

## 2014-01-17 DIAGNOSIS — Z96649 Presence of unspecified artificial hip joint: Secondary | ICD-10-CM

## 2014-01-17 DIAGNOSIS — I739 Peripheral vascular disease, unspecified: Secondary | ICD-10-CM | POA: Diagnosis present

## 2014-01-17 DIAGNOSIS — K769 Liver disease, unspecified: Secondary | ICD-10-CM | POA: Diagnosis present

## 2014-01-17 DIAGNOSIS — E875 Hyperkalemia: Secondary | ICD-10-CM | POA: Diagnosis present

## 2014-01-17 DIAGNOSIS — Z86718 Personal history of other venous thrombosis and embolism: Secondary | ICD-10-CM

## 2014-01-17 DIAGNOSIS — N2589 Other disorders resulting from impaired renal tubular function: Secondary | ICD-10-CM | POA: Diagnosis present

## 2014-01-17 DIAGNOSIS — R748 Abnormal levels of other serum enzymes: Secondary | ICD-10-CM | POA: Diagnosis present

## 2014-01-17 DIAGNOSIS — E860 Dehydration: Secondary | ICD-10-CM | POA: Diagnosis present

## 2014-01-17 DIAGNOSIS — G8929 Other chronic pain: Secondary | ICD-10-CM | POA: Diagnosis present

## 2014-01-17 DIAGNOSIS — N289 Disorder of kidney and ureter, unspecified: Secondary | ICD-10-CM

## 2014-01-17 DIAGNOSIS — Z91199 Patient's noncompliance with other medical treatment and regimen due to unspecified reason: Secondary | ICD-10-CM | POA: Diagnosis not present

## 2014-01-17 DIAGNOSIS — I5022 Chronic systolic (congestive) heart failure: Secondary | ICD-10-CM | POA: Diagnosis present

## 2014-01-17 DIAGNOSIS — M1990 Unspecified osteoarthritis, unspecified site: Secondary | ICD-10-CM | POA: Diagnosis present

## 2014-01-17 DIAGNOSIS — N179 Acute kidney failure, unspecified: Secondary | ICD-10-CM | POA: Diagnosis present

## 2014-01-17 DIAGNOSIS — N183 Chronic kidney disease, stage 3 unspecified: Secondary | ICD-10-CM | POA: Diagnosis present

## 2014-01-17 DIAGNOSIS — I484 Atypical atrial flutter: Secondary | ICD-10-CM | POA: Insufficient documentation

## 2014-01-17 DIAGNOSIS — I5042 Chronic combined systolic (congestive) and diastolic (congestive) heart failure: Secondary | ICD-10-CM

## 2014-01-17 DIAGNOSIS — R791 Abnormal coagulation profile: Secondary | ICD-10-CM

## 2014-01-17 DIAGNOSIS — F411 Generalized anxiety disorder: Secondary | ICD-10-CM | POA: Diagnosis present

## 2014-01-17 DIAGNOSIS — Z950 Presence of cardiac pacemaker: Secondary | ICD-10-CM

## 2014-01-17 DIAGNOSIS — E41 Nutritional marasmus: Secondary | ICD-10-CM | POA: Diagnosis present

## 2014-01-17 DIAGNOSIS — I428 Other cardiomyopathies: Secondary | ICD-10-CM | POA: Diagnosis not present

## 2014-01-17 DIAGNOSIS — N3000 Acute cystitis without hematuria: Secondary | ICD-10-CM

## 2014-01-17 DIAGNOSIS — I483 Typical atrial flutter: Secondary | ICD-10-CM

## 2014-01-17 DIAGNOSIS — J449 Chronic obstructive pulmonary disease, unspecified: Secondary | ICD-10-CM | POA: Diagnosis present

## 2014-01-17 DIAGNOSIS — J441 Chronic obstructive pulmonary disease with (acute) exacerbation: Secondary | ICD-10-CM | POA: Diagnosis present

## 2014-01-17 DIAGNOSIS — A498 Other bacterial infections of unspecified site: Secondary | ICD-10-CM | POA: Diagnosis present

## 2014-01-17 DIAGNOSIS — K219 Gastro-esophageal reflux disease without esophagitis: Secondary | ICD-10-CM | POA: Diagnosis present

## 2014-01-17 DIAGNOSIS — K5900 Constipation, unspecified: Secondary | ICD-10-CM | POA: Diagnosis present

## 2014-01-17 DIAGNOSIS — I4819 Other persistent atrial fibrillation: Secondary | ICD-10-CM

## 2014-01-17 DIAGNOSIS — E43 Unspecified severe protein-calorie malnutrition: Secondary | ICD-10-CM | POA: Diagnosis present

## 2014-01-17 DIAGNOSIS — F329 Major depressive disorder, single episode, unspecified: Secondary | ICD-10-CM

## 2014-01-17 DIAGNOSIS — I81 Portal vein thrombosis: Secondary | ICD-10-CM

## 2014-01-17 HISTORY — DX: Unspecified atrial fibrillation: I48.91

## 2014-01-17 LAB — CBC WITH DIFFERENTIAL/PLATELET
BASOS PCT: 1 % (ref 0–1)
Basophils Absolute: 0.1 10*3/uL (ref 0.0–0.1)
Eosinophils Absolute: 0.1 10*3/uL (ref 0.0–0.7)
Eosinophils Relative: 1 % (ref 0–5)
HEMATOCRIT: 39.9 % (ref 39.0–52.0)
Hemoglobin: 12.9 g/dL — ABNORMAL LOW (ref 13.0–17.0)
LYMPHS PCT: 23 % (ref 12–46)
Lymphs Abs: 1.9 10*3/uL (ref 0.7–4.0)
MCH: 28.4 pg (ref 26.0–34.0)
MCHC: 32.3 g/dL (ref 30.0–36.0)
MCV: 87.7 fL (ref 78.0–100.0)
Monocytes Absolute: 0.8 10*3/uL (ref 0.1–1.0)
Monocytes Relative: 9 % (ref 3–12)
NEUTROS PCT: 66 % (ref 43–77)
Neutro Abs: 5.5 10*3/uL (ref 1.7–7.7)
Platelets: 415 10*3/uL — ABNORMAL HIGH (ref 150–400)
RBC: 4.55 MIL/uL (ref 4.22–5.81)
RDW: 16.1 % — ABNORMAL HIGH (ref 11.5–15.5)
WBC: 8.4 10*3/uL (ref 4.0–10.5)

## 2014-01-17 LAB — MRSA PCR SCREENING: MRSA BY PCR: NEGATIVE

## 2014-01-17 LAB — COMPREHENSIVE METABOLIC PANEL
ALK PHOS: 246 U/L — AB (ref 39–117)
ALT: 61 U/L — ABNORMAL HIGH (ref 0–53)
AST: 109 U/L — AB (ref 0–37)
Albumin: 3.1 g/dL — ABNORMAL LOW (ref 3.5–5.2)
Anion gap: 22 — ABNORMAL HIGH (ref 5–15)
BUN: 64 mg/dL — ABNORMAL HIGH (ref 6–23)
CO2: 17 mEq/L — ABNORMAL LOW (ref 19–32)
Calcium: 9.8 mg/dL (ref 8.4–10.5)
Chloride: 100 mEq/L (ref 96–112)
Creatinine, Ser: 2.66 mg/dL — ABNORMAL HIGH (ref 0.50–1.35)
GFR calc non Af Amer: 23 mL/min — ABNORMAL LOW (ref >90)
GFR, EST AFRICAN AMERICAN: 27 mL/min — AB (ref >90)
GLUCOSE: 105 mg/dL — AB (ref 70–99)
Potassium: 6.2 mEq/L — ABNORMAL HIGH (ref 3.7–5.3)
SODIUM: 139 meq/L (ref 137–147)
TOTAL PROTEIN: 8.5 g/dL — AB (ref 6.0–8.3)
Total Bilirubin: 0.4 mg/dL (ref 0.3–1.2)

## 2014-01-17 LAB — COMPLETE METABOLIC PANEL WITH GFR
ALK PHOS: 242 U/L — AB (ref 39–117)
ALT: 59 U/L — ABNORMAL HIGH (ref 0–53)
AST: 112 U/L — ABNORMAL HIGH (ref 0–37)
Albumin: 3.2 g/dL — ABNORMAL LOW (ref 3.5–5.2)
BUN: 66 mg/dL — ABNORMAL HIGH (ref 6–23)
CO2: 22 meq/L (ref 19–32)
CREATININE: 3 mg/dL — AB (ref 0.50–1.35)
Calcium: 9.9 mg/dL (ref 8.4–10.5)
Chloride: 101 mEq/L (ref 96–112)
GFR, EST AFRICAN AMERICAN: 23 mL/min — AB
GFR, Est Non African American: 20 mL/min — ABNORMAL LOW
GLUCOSE: 93 mg/dL (ref 70–99)
Potassium: 5.6 mEq/L — ABNORMAL HIGH (ref 3.5–5.3)
Sodium: 139 mEq/L (ref 135–145)
Total Bilirubin: 0.4 mg/dL (ref 0.2–1.2)
Total Protein: 8.2 g/dL (ref 6.0–8.3)

## 2014-01-17 LAB — URINALYSIS, ROUTINE W REFLEX MICROSCOPIC
Bilirubin Urine: NEGATIVE
Glucose, UA: NEGATIVE mg/dL
Ketones, ur: NEGATIVE mg/dL
NITRITE: NEGATIVE
PROTEIN: 30 mg/dL — AB
Specific Gravity, Urine: 1.021 (ref 1.005–1.030)
UROBILINOGEN UA: 1 mg/dL (ref 0.0–1.0)
pH: 5 (ref 5.0–8.0)

## 2014-01-17 LAB — RAPID URINE DRUG SCREEN, HOSP PERFORMED
Amphetamines: NOT DETECTED
Barbiturates: NOT DETECTED
Benzodiazepines: NOT DETECTED
Cocaine: NOT DETECTED
OPIATES: NOT DETECTED
Tetrahydrocannabinol: POSITIVE — AB

## 2014-01-17 LAB — PROTIME-INR
INR: 5.78 — AB (ref <1.50)
PROTHROMBIN TIME: 52 s — AB (ref 11.6–15.2)

## 2014-01-17 LAB — TROPONIN I

## 2014-01-17 LAB — URINE MICROSCOPIC-ADD ON

## 2014-01-17 MED ORDER — DILTIAZEM HCL 25 MG/5ML IV SOLN
10.0000 mg | Freq: Once | INTRAVENOUS | Status: DC
  Filled 2014-01-17: qty 5

## 2014-01-17 MED ORDER — METOPROLOL SUCCINATE ER 50 MG PO TB24
50.0000 mg | ORAL_TABLET | Freq: Two times a day (BID) | ORAL | Status: DC
  Filled 2014-01-17: qty 1

## 2014-01-17 MED ORDER — DILTIAZEM HCL 60 MG PO TABS
60.0000 mg | ORAL_TABLET | Freq: Once | ORAL | Status: AC
  Administered 2014-01-17: 60 mg via ORAL

## 2014-01-17 MED ORDER — SODIUM CHLORIDE 0.9 % IJ SOLN
3.0000 mL | Freq: Two times a day (BID) | INTRAMUSCULAR | Status: DC
  Administered 2014-01-17 – 2014-01-20 (×4): 3 mL via INTRAVENOUS

## 2014-01-17 MED ORDER — SODIUM POLYSTYRENE SULFONATE 15 GM/60ML PO SUSP
15.0000 g | Freq: Once | ORAL | Status: AC
  Administered 2014-01-18: 15 g via ORAL
  Filled 2014-01-17: qty 60

## 2014-01-17 MED ORDER — ALBUTEROL SULFATE (2.5 MG/3ML) 0.083% IN NEBU: 2.5000 mg | INHALATION_SOLUTION | Freq: Four times a day (QID) | RESPIRATORY_TRACT | Status: DC | PRN

## 2014-01-17 MED ORDER — METOPROLOL TARTRATE 25 MG PO TABS
25.0000 mg | ORAL_TABLET | Freq: Two times a day (BID) | ORAL | Status: DC
  Administered 2014-01-17 – 2014-01-18 (×2): 25 mg via ORAL
  Filled 2014-01-17 (×3): qty 1

## 2014-01-17 MED ORDER — BUPROPION HCL 75 MG PO TABS
75.0000 mg | ORAL_TABLET | Freq: Two times a day (BID) | ORAL | Status: DC
  Administered 2014-01-17 – 2014-01-21 (×8): 75 mg via ORAL
  Filled 2014-01-17 (×9): qty 1

## 2014-01-17 MED ORDER — DEXTROSE 5 % IV SOLN
5.0000 mg/h | INTRAVENOUS | Status: DC
  Filled 2014-01-17: qty 100

## 2014-01-17 MED ORDER — POLYETHYLENE GLYCOL 3350 17 G PO PACK
17.0000 g | PACK | Freq: Every day | ORAL | Status: DC
  Administered 2014-01-17 – 2014-01-21 (×5): 17 g via ORAL
  Filled 2014-01-17 (×5): qty 1

## 2014-01-17 MED ORDER — HEPARIN SODIUM (PORCINE) 5000 UNIT/ML IJ SOLN: 5000.0000 [IU] | Freq: Three times a day (TID) | INTRAMUSCULAR | Status: DC

## 2014-01-17 MED ORDER — SODIUM CHLORIDE 0.9 % IV SOLN
INTRAVENOUS | Status: AC
  Administered 2014-01-17: 100 mL/h via INTRAVENOUS
  Administered 2014-01-18 (×2): via INTRAVENOUS

## 2014-01-17 MED ORDER — PANTOPRAZOLE SODIUM 40 MG PO TBEC
40.0000 mg | DELAYED_RELEASE_TABLET | Freq: Every day | ORAL | Status: DC
  Administered 2014-01-17 – 2014-01-21 (×6): 40 mg via ORAL
  Filled 2014-01-17 (×6): qty 1

## 2014-01-17 MED ORDER — TIOTROPIUM BROMIDE MONOHYDRATE 18 MCG IN CAPS
18.0000 ug | ORAL_CAPSULE | Freq: Every day | RESPIRATORY_TRACT | Status: DC
  Administered 2014-01-18 – 2014-01-21 (×4): 18 ug via RESPIRATORY_TRACT
  Filled 2014-01-17: qty 5

## 2014-01-17 MED ORDER — SODIUM BICARBONATE 650 MG PO TABS
1300.0000 mg | ORAL_TABLET | Freq: Two times a day (BID) | ORAL | Status: DC
  Administered 2014-01-17 – 2014-01-21 (×8): 1300 mg via ORAL
  Filled 2014-01-17 (×9): qty 2

## 2014-01-17 MED ORDER — BOOST PLUS PO LIQD
237.0000 mL | Freq: Three times a day (TID) | ORAL | Status: DC
  Administered 2014-01-19 – 2014-01-21 (×4): 237 mL via ORAL
  Filled 2014-01-17 (×17): qty 237

## 2014-01-17 NOTE — Progress Notes (Signed)
ANTICOAGULATION CONSULT NOTE - Initial Consult  Pharmacy Consult for Warfarin Indication: atrial fibrillation and arterial embolism and thrombus of RLE  Allergies  Allergen Reactions  . Digoxin And Related     Dig toxicity 2009  . Penicillins Rash    Patient Measurements: Height: '6\' 3"'  (190.5 cm) Weight: 158 lb 15.2 oz (72.1 kg) IBW/kg (Calculated) : 84.5  Vital Signs: Temp: 97.5 F (36.4 C) (08/31 1820) Temp src: Oral (08/31 1820) BP: 104/75 mmHg (08/31 1524) Pulse Rate: 66 (08/31 1524)  Labs:  Recent Labs  01/17/14 1642  HGB 12.9*  HCT 39.9  PLT 415*  LABPROT 52.0*  INR 5.78*  CREATININE 3.00*    Estimated Creatinine Clearance: 23.7 ml/min (by C-G formula based on Cr of 3).   Medical History: Past Medical History  Diagnosis Date  . Hypertension   . Chronic systolic heart failure     a. NICM - EF 35% with normal cors 2003. b. Last echo 11/2012 - EF 25-30%.  . Depression   . GERD (gastroesophageal reflux disease)   . Portal vein thrombosis   . Avascular necrosis     right hip s/p replacement  . Gastritis   . Alcohol abuse   . Erectile dysfunction   . Bipolar disorder   . Hydrocele, unspecified   . Intermittent claudication   . Lung nodule     Chest CT scan on 12/14/2008 showed a nodular opacity at the left lung base felt to most likely represent scarring.  Follow-up chest CT scan on 06/02/2010 showed parenchymal scarring in the left apex, left  lower lobe, and lingula; some of this scarring at the left base had a nodular appearance, unchanged. Chest 09/2012 - stable.  . 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  Thyroid scan 08/28/2009 was normal with no focal areas of abnormal increased or decreased activity seen; the uptake of I 131 sodium iodide at 24 hours was 5.7%.  TSH and free T4 normalized by 08/16/2009.  . Intestinal obstruction   . COPD (chronic obstructive pulmonary disease)   . Clostridium difficile  colitis   . E coli bacteremia   . DVT (deep venous thrombosis)     He has hypercoagulability with multiple prior DVTs  . Pleural plaque     H/o asbestos exposure. Chest CT on 06/02/2010 showed stable extensive calcified pleural plaques involving the left hemithorax, consistent with asbestos related pleural disease.  . Weight loss     Normal colonoscopy by Dr. Olevia Perches on 11/06/2010.  . Tachycardia-bradycardia syndrome     a. 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 b. [employer] gen change 2010.  [name] PAF (paroxysmal atrial fibrillation)     Paroxysmal, failed medical therapy with amiodarone but has done very well with multaq;  d/c 2/2 to recurrent AFib;  Tikosyn load c/b Hyperkalemia during admx 05/2013 => rate control strategy  . 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  . Noncompliance   . Atrial flutter   . Thrombocytopenia   . Hyperkalemia     type 4 RTA  . Pacemaker   . Shortness of breath     "all the time now" (07/02/2013)  . On home oxygen therapy     "2L prn" (07/02/2013)  . Pneumonia   . Cluster headaches     "~ twice/wk" (07/02/2013)  . Osteoarthritis   . Spondylosis   . Arthritis     "  hips and legs" (07/02/2013)  . Chronic back pain     "all over my back" (07/02/2013)  . Anxiety   . CKD (chronic kidney disease)     Renal U/S 12/04/2009 showed no pathological findings. Labs 12/04/2009 include normal ESR, C3, C4; neg ANA; SPEP showed nonspecific increase in the alpha-2 region with no M-spike; UPEP showed no monoclonal free light chains; urine IFE showed polyclonal increase in feree Kappa and/or free Lambda light chains. Baseline Cr reported 1.7-2.5.    Medications:  Prescriptions prior to admission  Medication Sig Dispense Refill  . albuterol (PROVENTIL HFA;VENTOLIN HFA) 108 (90 BASE) MCG/ACT inhaler Inhale 2 puffs into the lungs every 6 (six) hours as needed for wheezing or  shortness of breath.      [name] buPROPion (WELLBUTRIN) 75 MG tablet Take 1 tablet (75 mg total) by mouth 2 (two) times daily.  60 tablet  5  . diltiazem (DILACOR XR) 240 MG 24 hr capsule Take 240 mg by mouth daily.      . metoprolol succinate (TOPROL-XL) 50 MG 24 hr tablet Take 1 tablet (50 mg total) by mouth 2 (two) times daily. Take with or immediately following a meal.  180 tablet  1  . omeprazole (PRILOSEC) 20 MG capsule Take 2 capsules (40 mg total) by mouth daily.  60 capsule  6  . oxyCODONE-acetaminophen (PERCOCET) 10-325 MG per tablet Take 1 tablet by mouth every 4 (four) hours as needed for pain. Do not take more than 5 doses in a 24 hour period.  140 tablet  0  . sodium bicarbonate 650 MG tablet Take 2 tablets (1,300 mg total) by mouth 2 (two) times daily.  120 tablet  0  . tiotropium (SPIRIVA) 18 MCG inhalation capsule Place 1 capsule (18 mcg total) into inhaler and inhale daily.  30 capsule  11  . warfarin (COUMADIN) 5 MG tablet Take 7.5-10 mg by mouth daily. Take 37m on Monday and Friday, 7.562mall other days        Assessment: [age]yoale on warfarin for Afib and h/o DVT presenting with supratherapeutic INR of 5.78. Hgb is slightly low, Hct and Plt wnl. Recently discharged following b/l hip replacement, R femoral embolectomy, intra-operative arteriogram and medial and lateral extremity fasciotomies. Will hold warfarin dose.  Goal of Therapy:  INR 2-3 Monitor platelets by anticoagulation protocol: Yes   Plan:  - Hold warfarin dose tonight x 1 - Daily INR  CoAndrey CotaBaDiona FoleyPharmD Clinical Pharmacist Pager [phone]/31/2015,7:59 PM

## 2014-01-17 NOTE — Progress Notes (Signed)
   Subjective:    Patient ID: [name], male    DOB: Dec 14, 1945, 68 y.o.   MRN: [id number]  HPI Mr. Adduci is a 68 year old man with PMH significant for NICM, tobacco abuse, COPD, CKD3, A. Fib on coumadin, s/p bilateral hip replacement and recent arterial embolism and thrombus of RLE S/P right femoral embolectomy, intra-operative arteriogram and medial and lateral extremity fasciotomies in July 2015  who was recently discharged from [facility] and remains on coumadin therapy.   He saw Dr. Donnetta Hutching, in Vascular Surgery, on August 18th who assessed his wounds and found them to be well healing.  Mr. Ohair continues to be followed at home with [facility] RN, PT, and wound care.   He tells me that he continues to have chornic bilateral hip pain, worse in the right in addition to bilateral leg pain. He does not need refill of his Percocet today.   His appetite has been decreased for the past week and worsened this past weekend. He feels dizzy and had a difficult time showering this morning.   He has dark urine with bad smell, he has had decreased UOP in the past 24 hours 1-2 times. He denies dysuria, or burning sensation but he has to strain to urinated.    He reports no BM in 1 week.   Denies ha, fever/chills, diarrhea, N/V, hematuria, hematochezia, or melena   Review of Systems     Objective:   Physical Exam  Nursing note and vitals reviewed. Constitutional: He is oriented to person, place, and time. No distress.  Very thin, tired appearing  Cardiovascular:  Tachycardia, with HR of 200, irregularly irregular rate  Pulmonary/Chest: Effort normal and breath sounds normal. No respiratory distress. He has no wheezes. He has no rales.  Abdominal: Soft. There is tenderness.  LLQ mildly TTP  Musculoskeletal: He exhibits no edema.  Right leg surgical incision covered with gauze that is c/d/i  Neurological: He is alert and oriented to person, place, and time.  Skin: Skin is warm and dry.            Assessment & Plan:   He has orthostatic hypotension, rapid HR (did not take his meds this morning due to being rushed), and supra therapeutic INR (>8 per pOCT),   Will give cardizem 60mg  PO once, admit to stepdown in case he needs cardizem drip. EKG with slower HR of 138, A. Fib to A. fluter Getting STAT INR, CMP, and CBC,  Discussed case with attending, Dr. Dareen Piano who advises that patient be admitted.

## 2014-01-17 NOTE — Patient Instructions (Signed)
Patient instructed to take medications as defined in the Anti-coagulation Track section of this encounter.  Patient instructed to OMIT/HOLD today's dose and subsequent dosing as patient is being admitted.  Patient verbalized understanding of these instructions.

## 2014-01-17 NOTE — Addendum Note (Signed)
Addended by: Adele Barthel D on: 01/17/2014 05:03 PM   Modules accepted: Orders

## 2014-01-17 NOTE — H&P (Signed)
Date: 01/18/2014               Patient Name:  [name] MRN: [id number]  DOB: 1946-05-20 Age / Sex: 68 y.o., male   PCP: Travis Filler, MD         Medical Service: Internal Medicine Teaching Service         Attending Physician: Dr. Madilyn Fireman, MD    First Contact: Dr. Karle Starch [name] Pager: [phone]  Second Contact: Dr. Karlyn [name] Pager: [phone]       After Hours (After 5p/  First Contact Pager: [phone]  weekends / holidays): Second Contact Pager: [phone]   Chief Complaint: decreased appetite and dizziness  History of Present Illness:   [name] is a 68 year old gentleman with a history of nonischemic cardiomyopathy, bradycardia-tachycardia syndrome (with a pacer), COPD, stage III chronic kidney disease, Chronic A. Fib (on Coumadin), tobacco abuse, and recent admission (7/24-08/07/2013) for a right lower extremity DVT status post right femoral embolectomy and right lower extremity fasciotomy who presents with a week of decreased appetite and dizziness.  Patient was admitted from clinic when he was found to be in atrial flutter with rates up to 200, orthostatic hypotension, and supratherapeutic INR. Patient states that he has not been able to eat or drink much since his discharge from the hospital. He explains that this is mostly due to decreased appetite, and denies any significant nausea, vomiting, or abdominal pain. On a typical day, he may not eat much at all, but is able to drink some water. Patient also reports being lightheaded and fatigued with exertion and standing. He also reports having dark and scant urine that is malodorous with associated suprapubic tenderness, dysuria, and hesitancy. He also reports not having a bowel movement in about a week. He denies any chest pain, fevers, chills, productive cough, or abdominal pain. He denies any new pain or swelling around his fasciotomy site. Patient states that he has been compliant with his medications with the exception  of the day of presentation. Patient reports that he has been having blood clots since the 90s and denies any history of liver disease.  Meds: Current Facility-Administered Medications  Medication Dose Route Frequency Provider Last Rate Last Dose  . 0.9 %  sodium chloride infusion   Intravenous Continuous Travis Genre, MD 100 mL/hr at 01/17/14 2010 100 mL/hr at 01/17/14 2010  . albuterol (PROVENTIL) (2.5 MG/3ML) 0.083% nebulizer solution 2.5 mg  2.5 mg Inhalation Q6H PRN Travis Genre, MD      . buPROPion [facility]) tablet 75 mg  75 mg Oral BID Travis Genre, MD   75 mg at 01/17/14 2154  . lactose free nutrition (BOOST PLUS) liquid 237 mL  237 mL Oral TID WC Travis Gallant, MD      . metoprolol tartrate (LOPRESSOR) tablet 25 mg  25 mg Oral BID Travis Moore, MD   25 mg at 01/17/14 2156  . pantoprazole (PROTONIX) EC tablet 40 mg  40 mg Oral Daily Travis Genre, MD   40 mg at 01/17/14 2153  . polyethylene glycol (MIRALAX / GLYCOLAX) packet 17 g  17 g Oral Daily Travis Gallant, MD   17 g at 01/17/14 2153  . sodium bicarbonate tablet 1,300 mg  1,300 mg Oral BID Travis Genre, MD   1,300 mg at 01/17/14 2154  . sodium chloride 0.9 % injection 3 mL  3 mL Intravenous Q12H Travis Genre, MD   3 mL at 01/17/14 2154  .  sodium polystyrene (KAYEXALATE) 15 GM/60ML suspension 15 g  15 g Oral Once Travis Genre, MD      . sulfamethoxazole-trimethoprim (BACTRIM DS) 800-160 MG per tablet 1 tablet  1 tablet Oral Q12H Travis Moore, MD      . tiotropium [facility]) inhalation capsule 18 mcg  18 mcg Inhalation Daily Travis Genre, MD        Allergies: Allergies as of 01/17/2014 - Review Complete 01/17/2014  Allergen Reaction Noted  . Digoxin and related  05/17/2013  . Penicillins Rash    Past Medical History  Diagnosis Date  . Hypertension   . Chronic systolic heart failure     a. NICM - EF 35% with normal cors 2003. b. Last echo 11/2012 - EF 25-30%.  . Depression   . GERD (gastroesophageal reflux disease)     . Portal vein thrombosis   . Avascular necrosis     right hip s/p replacement  . Gastritis   . Alcohol abuse   . Erectile dysfunction   . Bipolar disorder   . Hydrocele, unspecified   . Intermittent claudication   . Lung nodule     Chest CT scan on 12/14/2008 showed a nodular opacity at the left lung base felt to most likely represent scarring.  Follow-up chest CT scan on 06/02/2010 showed parenchymal scarring in the left apex, left  lower lobe, and lingula; some of this scarring at the left base had a nodular appearance, unchanged. Chest 09/2012 - stable.  . 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  Thyroid scan 08/28/2009 was normal with no focal areas of abnormal increased or decreased activity seen; the uptake of I 131 sodium iodide at 24 hours was 5.7%.  TSH and free T4 normalized by 08/16/2009.  . Intestinal obstruction   . COPD (chronic obstructive pulmonary disease)   . Clostridium difficile colitis   . E coli bacteremia   . DVT (deep venous thrombosis)     He has hypercoagulability with multiple prior DVTs  . Pleural plaque     H/o asbestos exposure. Chest CT on 06/02/2010 showed stable extensive calcified pleural plaques involving the left hemithorax, consistent with asbestos related pleural disease.  . Weight loss     Normal colonoscopy by Dr. Olevia Perches on 11/06/2010.  . Tachycardia-bradycardia syndrome     a. 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 b. St. Jude gen change 2010.  [name] PAF (paroxysmal atrial fibrillation)     Paroxysmal, failed medical therapy with amiodarone but has done very well with multaq;  d/c 2/2 to recurrent AFib;  Tikosyn load c/b Hyperkalemia during admx 05/2013 => rate control strategy  . 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  . Noncompliance   . Atrial flutter   . Thrombocytopenia   . Hyperkalemia     type 4 RTA   . Pacemaker   . Shortness of breath     "all the time now" (07/02/2013)  . On home oxygen therapy     "2L prn" (07/02/2013)  . Pneumonia   . Cluster headaches     "~ twice/wk" (07/02/2013)  . Osteoarthritis   . Spondylosis   . Arthritis     "hips and legs" (07/02/2013)  . Chronic back pain     "all over my back" (07/02/2013)  . Anxiety   . CKD (chronic kidney disease)     Renal U/S  12/04/2009 showed no pathological findings. Labs 12/04/2009 include normal ESR, C3, C4; neg ANA; SPEP showed nonspecific increase in the alpha-2 region with no M-spike; UPEP showed no monoclonal free light chains; urine IFE showed polyclonal increase in feree Kappa and/or free Lambda light chains. Baseline Cr reported 1.7-2.5.   Past Surgical History  Procedure Laterality Date  . Hemiarthroplasty hip Left 09/09/2002    bipolar; Dr. Lafayette Dragon  . Pacemaker insertion  02/2003    Dr. Roe Rutherford, [place] pulse generator identity SR model [id number] serial [id number]; gen change by Greggory Brandy  . Total hip arthroplasty Right 03/08/2008     By Dr. Hiram Comber  . Tee without cardioversion N/A 05/27/2013    Procedure: TRANSESOPHAGEAL ECHOCARDIOGRAM (TEE);  Surgeon: Candee Furbish, MD;  Location: [facility] ENDOSCOPY;  Service: Cardiovascular;  Laterality: N/A;  . Cardioversion N/A 05/27/2013    Procedure: CARDIOVERSION;  Surgeon: Candee Furbish, MD;  Location: [facility] ENDOSCOPY;  Service: Cardiovascular;  Laterality: N/A;  . Cardiac catheterization  2003    Nl cors, EF 35%  . Pacemaker placement  06/17/2008    Lead and generator change w/ St. Jude Medical Tendril ST model [id number] (serial  [id number]).       . Insert / replace / remove pacemaker    . Embolectomy Right 12/10/2013    Procedure: Right Femoral Embolectomy; Fasciotomy Right Lower Leg with Intraoperative arteriogram;  Surgeon: Rosetta Posner, MD;  Location: [place];  Service: Vascular;  Laterality: Right;  . Intraoperative arteriogram Right 12/10/2013    Procedure: INTRA OPERATIVE  ARTERIOGRAM;  Surgeon: Rosetta Posner, MD;  Location: [place];  Service: Vascular;  Laterality: Right;  . Fasciotomy closure Right 12/15/2013    Procedure: LEG FASCIOTOMY CLOSURE;  Surgeon: Rosetta Posner, MD;  Location: [facility] OR;  Service: Vascular;  Laterality: Right;   Family History  Problem Relation Age of Onset  . Hypertension Mother   . Cancer Brother     Unsure of type.  . Asthma Mother   . Coronary artery disease Mother   . Colon cancer Neg Hx   . Lung cancer Neg Hx   . Prostate cancer Neg Hx    History   Social History  . Marital Status: Widowed    Spouse Name: N/A    Number of Children: 10  . Years of Education: N/A   Occupational History  . RETIRED    Social History Main Topics  . Smoking status: Former Smoker -- 1.50 packs/day for 54 years    Types: Cigarettes    Quit date: 06/08/2013  . Smokeless tobacco: Never Used  . Alcohol Use: No     Comment: 07/02/2013 "h/o etoh abuse; stopped drinking ~ 2012"  . Drug Use: No     Comment: 07/02/2013 "stopped marijuana   . Sexual Activity: Not Currently   Other Topics Concern  . Not on file   Social History Narrative   Works as a Architect part time   Smoker 1 pack last 1.5 days tobacco, smokes marijuana    Denies EtOH x 4 years (on 10/12/12)   12 kids    From [place]   [place] Veteran    12 grade education              Review of Systems: Pertinent items are noted in HPI.  Physical Exam: General: resting in bed HEENT: PERRL, EOMI, no scleral icterus Cardiac: tachycardic in the 120s, irregularly irregular, no murmurs, rubs or gallops Pulm: clear to auscultation  bilaterally, moving normal volumes of air Abd: soft, nontender, nondistended, BS present Ext: warm and well perfused, no pedal edema, right lower extremity fasciotomy site clean dry and intact Neuro: alert and oriented X3, cranial nerves II-XII grossly intact  Lab results: Basic Metabolic Panel:  Recent Labs  01/17/14 1642 01/17/14 2100  NA 139  139  K 5.6* 6.2*  CL 101 100  CO2 22 17*  GLUCOSE 93 105*  BUN 66* 64*  CREATININE 3.00* 2.66*  CALCIUM 9.9 9.8   Liver Function Tests:  Recent Labs  01/17/14 1642 01/17/14 2100  AST 112* 109*  ALT 59* 61*  ALKPHOS 242* 246*  BILITOT 0.4 0.4  PROT 8.2 8.5*  ALBUMIN 3.2* 3.1*   No results found for this basename: LIPASE, AMYLASE,  in the last 72 hours No results found for this basename: AMMONIA,  in the last 72 hours CBC:  Recent Labs  01/17/14 1642  WBC 8.4  NEUTROABS 5.5  HGB 12.9*  HCT 39.9  MCV 87.7  PLT 415*   Cardiac Enzymes:  Recent Labs  01/17/14 2035  TROPONINI <0.30   BNP: No results found for this basename: PROBNP,  in the last 72 hours D-Dimer: No results found for this basename: DDIMER,  in the last 72 hours CBG: No results found for this basename: GLUCAP,  in the last 72 hours Hemoglobin A1C: No results found for this basename: HGBA1C,  in the last 72 hours Fasting Lipid Panel: No results found for this basename: CHOL, HDL, LDLCALC, TRIG, CHOLHDL, LDLDIRECT,  in the last 72 hours Thyroid Function Tests: No results found for this basename: TSH, T4TOTAL, FREET4, T3FREE, THYROIDAB,  in the last 72 hours Anemia Panel: No results found for this basename: VITAMINB12, FOLATE, FERRITIN, TIBC, IRON, RETICCTPCT,  in the last 72 hours Coagulation:  Recent Labs  01/17/14 1642  LABPROT 52.0*  INR 5.78*   Urine Drug Screen: Drugs of Abuse     Component Value Date/Time   LABOPIA NONE DETECTED 01/17/2014 2208   LABOPIA NEGATIVE 12/27/2008 1943   COCAINSCRNUR NONE DETECTED 01/17/2014 2208   COCAINSCRNUR NEG 07/06/2009 2225   LABBENZ NONE DETECTED 01/17/2014 2208   LABBENZ NEG 07/06/2009 2225   AMPHETMU NONE DETECTED 01/17/2014 2208   AMPHETMU NEG 07/06/2009 2225   THCU POSITIVE* 01/17/2014 2208   LABBARB NONE DETECTED 01/17/2014 2208    Alcohol Level: No results found for this basename: ETH,  in the last 72 hours Urinalysis:  Recent Labs   01/17/14 2208  COLORURINE AMBER*  LABSPEC 1.021  PHURINE 5.0  GLUCOSEU NEGATIVE  HGBUR MODERATE*  BILIRUBINUR NEGATIVE  KETONESUR NEGATIVE  PROTEINUR 30*  UROBILINOGEN 1.0  NITRITE NEGATIVE  LEUKOCYTESUR LARGE*    Imaging results:  Dg Chest Port 1 View  01/17/2014   CLINICAL DATA:  Short of breath.  Atrial fibrillation.  EXAM: PORTABLE CHEST - 1 VIEW  COMPARISON:  06/02/2013.  FINDINGS: There is chronic pleural parenchymal opacity in the left mid and lower lung zone consistent with chronic scarring. Chronic apical pleural thickening. There is associated volume loss. The right lung is hyperexpanded but clear. Cardiac silhouette is normal in size. No mediastinal or hilar masses.  Right anterior chest wall sequential pacemaker is stable with both leads projecting in the right ventricle.  Bony thorax is intact.  IMPRESSION: No acute cardiopulmonary disease. Stable chronic findings in the left lung.   Electronically Signed   By: Lajean Manes M.D.   On: 01/17/2014 21:08    Other results:  EKG: atrial flutter with variable A-V block, rate 138.  Assessment & Plan by Problem: Principal Problem:   A-fib Active Problems:   BRADYCARDIA-TACHYCARDIA SYNDROME   COPD (chronic obstructive pulmonary disease)   Long-term (current) use of anticoagulants   Chronic kidney disease   Non-ischemic cardiomyopathy   Severe malnutrition   PVD (peripheral vascular disease)  Mr. Franchini is a 68 year old gentleman with a history of nonischemic cardiomyopathy, bradycardia-tachycardia syndrome (with a pacer), COPD, stage III chronic kidney disease, Chronic A. Fib (on Coumadin), tobacco abuse, and recent admission (7/24-08/07/2013) for a right lower extremity DVT status post right femoral embolectomy and right lower extremity fasciotomy who presents with atrial flutter with variable A-V block with rates ranging from 120-200.  # Atrial flutter: Patient has chronic atrial fibrillation on Coumadin and has been  adequately rate controlled at home with metoprolol 50 mg. Persistent tachycardia could be multifactorial, including decreased by PO intake and medical non-adherence for today. Urinary tract infection as an etiology also possible. Patient currently stable with rates in the 120s and blood pressure 109/65.  - Will check a TSH in the morning. Last normal 05/24/2013.  - Will trend troponins    - convert patient to metoprolol tartrate from home succinate  - Echo  - Interrogate pacemaker.   - Chest x-ray  # Acute on chronic renal insufficiency (h/o type IV RTA): Creatinine up to 3.00 from 1.87 four weeks ago. Likely pre-renal given decreased PO intake and elevated BUN.  - IV fluids and continue to monitor.   - Continue home bicarbonate  # Uncomplicated UTI: dysuria, hesitancy, and foul-smelling urine concerning for urinary tract infection. UA positive for infection. No fevers, flank pain, CVA tenderness.  - Urine culture pending.   - Bactrim started.  # Hyperkalemia: Potassium at 5.5. No relevant EKG changes. Not on ace inhibitor or other potassium sparing diuretics. Patient previously worked up for persistent hyperkalemia in January 2015 by nephrology and it was favored to be secondary to RTA type 4 vs. High K+ diet vs tikosyn. Patient with poor PO intake and not on tikosyn at this point, so would currently favor RTA as the etiology.  - Kayexalate  - Will followup repeat labs  # Constipation: no bowel movements for a week, but could be secondary to decreased by mouth intake.  - Continue home miralax.  # Elevated liver enzymes: Mildly elevated transaminases along with elevated alkaline phosphatase in no compelling hepatocellular or cholestatic pattern. AST, ALT, and alkaline phosphatase all normal as of 12/10/13. No upper abdominal pain. No history of alcohol abuse or liver disease. Patient has history of portal vein thrombosis, though INR supratherapeutic. No evidence of fatty liver disease on  abdominal imaging from May 2014. No hepatitis panels on record, though elevations are not high enough to suggest acute hepatitis. Values persistently elevated on repeated labs. Etiology is unclear.   - followup repeat INR  # COPD: no acute issues  - Continue home tiotropium and albuterol as needed  # Depression:  - Continue home bupropion  # GERD: No acute issues.   - Continue home protonix.  # Diet  - Renal diet with thins  Dispo: Disposition is deferred at this time, awaiting improvement of current medical problems. Anticipated discharge in approximately 3 day(s).   The patient does have a current PCP Travis Filler, MD) and does need an [facility] hospital follow-up appointment after discharge.  The patient does not have transportation limitations that hinder transportation to clinic appointments.  Signed: Purcell Nails  Raelene Bott, MD 01/18/2014, 12:42 AM

## 2014-01-17 NOTE — Progress Notes (Signed)
Anti-Coagulation Progress Note  [name] is a 68 y.o. male who is currently on an anti-coagulation regimen.    RECENT RESULTS: Recent results are below, the most recent result is correlated with a dose of:  Thursday of last week (26-AUG-15) called by Dr. Marinda Elk with a reported HHA collected POC INR of 5.8. The patient had been on at that time---a regimen of 2x5 mg tablets (10mg ) on Monday/Friday of each week and a dose of 1&1/2 x 5mg  (7.5mg ) all other days. Given the recent arterial embolic event to his LE we elected to be conservative in his dose reduction. A decision was made to HOLD/OMIT that days dose (Thursday 25-AUG-15) and the following day (Friday) his dose would be 5mg  and Saturday and Sunday--his dose was to have been 7.5mg . He was advised to come to the anticoagulation management clinic on Monday 31-AUG-15. He came to his appointment but also requested visit with a physician. His fingerstick INR revealed a result of >8.0. Subsequent venous drawn and central laboratory testing results revealed an INR = 5.78. A decision was made--given his constitutional symptoms (anorexia, weight loss with dizziness) to admit him to hospital. He has been admitted to the inpatient service.  Lab Results  Component Value Date   INR 5.78* 01/17/2014   INR 2.40* 12/20/2013   INR 2.06* 12/19/2013    ANTI-COAG DOSE: Anticoagulation Dose Instructions as of 01/17/2014     Sun Mon Tue Wed Thu Fri Sat   New Dose Hold Hold Hold Hold Hold Hold Hold       ANTICOAG SUMMARY: Anticoagulation Episode Summary   Current INR goal   Next INR check 01/31/2014  INR from last check 5.78 (01/17/2014)  Weekly max dose   Target end date   INR check location   Preferred lab   Send INR reminders to ANTICOAG IMP   Indications  Long-term (current) use of anticoagulants [V58.61] Atrila fibrillation (Resolved) [427.31] Thrombosis 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]        Comments       Anticoagulation Care Providers   Provider Role  Specialty Phone number   Axel Filler, MD  Internal Medicine [phone]      ANTICOAG TODAY: Anticoagulation Summary as of 01/17/2014   INR goal   Selected INR 5.78 (01/17/2014)  Next INR check 01/31/2014  Target end date    Indications  Long-term (current) use of anticoagulants [V58.61] Atrila fibrillation (Resolved) [427.31] Thrombosis 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]      Anticoagulation Episode Summary   INR check location    Preferred lab    Send INR reminders to ANTICOAG IMP   Comments     Anticoagulation Care Providers   Provider Role Specialty Phone number   Axel Filler, MD  Internal Medicine [phone]      PATIENT INSTRUCTIONS: Patient Instructions  Patient instructed to take medications as defined in the Anti-coagulation Track section of this encounter.  Patient instructed to OMIT/HOLD today's dose and subsequent dosing as patient is being admitted.  Patient verbalized understanding of these instructions.       FOLLOW-UP Return in 2 weeks (on 01/31/2014) for Follow up INR.  Jorene Guest, III Pharm.D., CACP

## 2014-01-18 DIAGNOSIS — I495 Sick sinus syndrome: Secondary | ICD-10-CM

## 2014-01-18 DIAGNOSIS — I428 Other cardiomyopathies: Secondary | ICD-10-CM

## 2014-01-18 DIAGNOSIS — I4892 Unspecified atrial flutter: Secondary | ICD-10-CM

## 2014-01-18 DIAGNOSIS — Z7901 Long term (current) use of anticoagulants: Secondary | ICD-10-CM

## 2014-01-18 DIAGNOSIS — I4891 Unspecified atrial fibrillation: Principal | ICD-10-CM

## 2014-01-18 LAB — COMPREHENSIVE METABOLIC PANEL
ALT: 49 U/L (ref 0–53)
AST: 67 U/L — ABNORMAL HIGH (ref 0–37)
Albumin: 2.9 g/dL — ABNORMAL LOW (ref 3.5–5.2)
Alkaline Phosphatase: 213 U/L — ABNORMAL HIGH (ref 39–117)
Anion gap: 16 — ABNORMAL HIGH (ref 5–15)
BUN: 62 mg/dL — ABNORMAL HIGH (ref 6–23)
CALCIUM: 9 mg/dL (ref 8.4–10.5)
CO2: 24 meq/L (ref 19–32)
Chloride: 102 mEq/L (ref 96–112)
Creatinine, Ser: 2.67 mg/dL — ABNORMAL HIGH (ref 0.50–1.35)
GFR calc Af Amer: 26 mL/min — ABNORMAL LOW (ref >90)
GFR calc non Af Amer: 23 mL/min — ABNORMAL LOW (ref >90)
Glucose, Bld: 74 mg/dL (ref 70–99)
Potassium: 4.9 mEq/L (ref 3.7–5.3)
SODIUM: 142 meq/L (ref 137–147)
TOTAL PROTEIN: 7.3 g/dL (ref 6.0–8.3)
Total Bilirubin: 0.3 mg/dL (ref 0.3–1.2)

## 2014-01-18 LAB — CBC WITH DIFFERENTIAL/PLATELET
BASOS PCT: 0 % (ref 0–1)
Basophils Absolute: 0 10*3/uL (ref 0.0–0.1)
EOS PCT: 3 % (ref 0–5)
Eosinophils Absolute: 0.3 10*3/uL (ref 0.0–0.7)
HCT: 37.2 % — ABNORMAL LOW (ref 39.0–52.0)
HEMOGLOBIN: 12 g/dL — AB (ref 13.0–17.0)
LYMPHS ABS: 2.4 10*3/uL (ref 0.7–4.0)
Lymphocytes Relative: 27 % (ref 12–46)
MCH: 28.4 pg (ref 26.0–34.0)
MCHC: 32.3 g/dL (ref 30.0–36.0)
MCV: 87.9 fL (ref 78.0–100.0)
MONO ABS: 0.9 10*3/uL (ref 0.1–1.0)
Monocytes Relative: 10 % (ref 3–12)
Neutro Abs: 5.4 10*3/uL (ref 1.7–7.7)
Neutrophils Relative %: 60 % (ref 43–77)
Platelets: 397 10*3/uL (ref 150–400)
RBC: 4.23 MIL/uL (ref 4.22–5.81)
RDW: 16.3 % — ABNORMAL HIGH (ref 11.5–15.5)
WBC: 9 10*3/uL (ref 4.0–10.5)

## 2014-01-18 LAB — PROTIME-INR
INR: 6.17 (ref 0.00–1.49)
Prothrombin Time: 54.7 seconds — ABNORMAL HIGH (ref 11.6–15.2)

## 2014-01-18 LAB — TROPONIN I
Troponin I: <0.3 ng/mL (ref <0.30)
Troponin I: <0.3 ng/mL (ref <0.30)

## 2014-01-18 LAB — TSH: TSH: 2.29 u[IU]/mL (ref 0.350–4.500)

## 2014-01-18 LAB — PATHOLOGIST SMEAR REVIEW

## 2014-01-18 LAB — GAMMA GT: GGT: 243 U/L — ABNORMAL HIGH (ref 7–51)

## 2014-01-18 MED ORDER — LEVOFLOXACIN 750 MG PO TABS
750.0000 mg | ORAL_TABLET | Freq: Once | ORAL | Status: AC
  Administered 2014-01-18: 750 mg via ORAL
  Filled 2014-01-18: qty 1

## 2014-01-18 MED ORDER — SULFAMETHOXAZOLE-TMP DS 800-160 MG PO TABS
1.0000 | ORAL_TABLET | Freq: Two times a day (BID) | ORAL | Status: DC
  Administered 2014-01-18: 1 via ORAL
  Filled 2014-01-18 (×3): qty 1

## 2014-01-18 MED ORDER — DILTIAZEM HCL ER 240 MG PO CP24: 240.0000 mg | ORAL_CAPSULE | Freq: Every day | ORAL | Status: DC

## 2014-01-18 MED ORDER — METOPROLOL TARTRATE 50 MG PO TABS
50.0000 mg | ORAL_TABLET | Freq: Two times a day (BID) | ORAL | Status: DC
  Filled 2014-01-18: qty 1

## 2014-01-18 MED ORDER — METOPROLOL TARTRATE 50 MG PO TABS
50.0000 mg | ORAL_TABLET | Freq: Two times a day (BID) | ORAL | Status: DC
  Administered 2014-01-18: 50 mg via ORAL

## 2014-01-18 MED ORDER — DILTIAZEM HCL ER COATED BEADS 240 MG PO CP24
240.0000 mg | ORAL_CAPSULE | Freq: Every day | ORAL | Status: DC
  Administered 2014-01-18 – 2014-01-21 (×4): 240 mg via ORAL
  Filled 2014-01-18 (×5): qty 1

## 2014-01-18 MED ORDER — OXYCODONE-ACETAMINOPHEN 10-325 MG PO TABS
1.0000 | ORAL_TABLET | ORAL | Status: DC | PRN
Start: 2014-01-18 — End: 2014-01-18

## 2014-01-18 MED ORDER — BOOST / RESOURCE BREEZE PO LIQD
1.0000 | Freq: Three times a day (TID) | ORAL | Status: DC
Start: 2014-01-18 — End: 2014-01-21
  Administered 2014-01-18: 19:00:00 via ORAL
  Administered 2014-01-19 – 2014-01-21 (×7): 1 via ORAL

## 2014-01-18 MED ORDER — OXYCODONE-ACETAMINOPHEN 5-325 MG PO TABS
1.0000 | ORAL_TABLET | ORAL | Status: DC | PRN
  Administered 2014-01-18 – 2014-01-21 (×10): 1 via ORAL
  Filled 2014-01-18 (×12): qty 1

## 2014-01-18 MED ORDER — LEVOFLOXACIN 500 MG PO TABS
500.0000 mg | ORAL_TABLET | ORAL | Status: DC
  Administered 2014-01-20: 500 mg via ORAL
  Filled 2014-01-18: qty 1

## 2014-01-18 MED ORDER — METOPROLOL TARTRATE 50 MG PO TABS
50.0000 mg | ORAL_TABLET | Freq: Two times a day (BID) | ORAL | Status: DC
  Administered 2014-01-19 – 2014-01-21 (×5): 50 mg via ORAL
  Filled 2014-01-18 (×7): qty 1

## 2014-01-18 MED ORDER — OXYCODONE HCL 5 MG PO TABS
5.0000 mg | ORAL_TABLET | ORAL | Status: DC | PRN
  Administered 2014-01-18 – 2014-01-21 (×10): 5 mg via ORAL
  Filled 2014-01-18 (×12): qty 1

## 2014-01-18 MED ORDER — DILTIAZEM HCL 60 MG PO TABS
60.0000 mg | ORAL_TABLET | Freq: Four times a day (QID) | ORAL | Status: DC
  Administered 2014-01-18: 60 mg via ORAL
  Filled 2014-01-18 (×4): qty 1

## 2014-01-18 NOTE — Progress Notes (Signed)
On call MD updated regarding pts flutter rate 120s-130s. Will continue to monitor.  Perkins, [place]

## 2014-01-18 NOTE — Progress Notes (Signed)
68yo male admitted for Afib has abnormal UA, to begin PO Levaquin.  Will give Levaquin 750mg  PO x1 followed by 500mg  PO Q48H and monitor CBC and Cx.  Wynona Neat, PharmD, BCPS 01/18/2014 7:37 AM

## 2014-01-18 NOTE — Progress Notes (Signed)
ANTICOAGULATION CONSULT NOTE - Follow Up Consult  Pharmacy Consult for warfarin Indication: atrial fibrillation and arterial embolism/thrombus of RLE  Allergies  Allergen Reactions  . Digoxin And Related Other (See Comments)    Dig toxicity 2009  . Penicillins Rash    Patient Measurements: Height: 6\' 3"  (190.5 cm) Weight: 166 lb 0.1 oz (75.3 kg) IBW/kg (Calculated) : 84.5  Vital Signs: Temp: 98.6 F (37 C) (09/01 1221) Temp src: Oral (09/01 1221) BP: 106/84 mmHg (09/01 1234) Pulse Rate: 76 (09/01 1221)  Labs:  Recent Labs  01/17/14 1642 01/17/14 2035 01/17/14 2100 01/18/14 0128 01/18/14 0725  HGB 12.9*  --   --  12.0*  --   HCT 39.9  --   --  37.2*  --   PLT 415*  --   --  397  --   LABPROT 52.0*  --   --  54.7*  --   INR 5.78*  --   --  6.17*  --   CREATININE 3.00*  --  2.66* 2.67*  --   TROPONINI  --  <0.30  --  <0.30 <0.30    Estimated Creatinine Clearance: 27.8 ml/min (by C-G formula based on Cr of 2.67).   Medications:  Scheduled:  . buPROPion  75 mg Oral BID  . diltiazem  60 mg Oral 4 times per day  . feeding supplement (RESOURCE BREEZE)  1 Container Oral TID BM  . lactose free nutrition  237 mL Oral TID WC  . [START ON 01/20/2014] levofloxacin  500 mg Oral Q48H  . metoprolol tartrate  25 mg Oral BID  . pantoprazole  40 mg Oral Daily  . polyethylene glycol  17 g Oral Daily  . sodium bicarbonate  1,300 mg Oral BID  . sodium chloride  3 mL Intravenous Q12H  . tiotropium  18 mcg Inhalation Daily    Assessment: 68 yo m on warfarin for afib and h/o DVT presenting on 8/31 from the clinic with supratherapeutic INR of 5.78.  Recently discharged from hospital s/p hip replacement, R femoral embolectomy, intra-operative arteriogram and medical/lateral extremity fasciotomies.  Last night's dose of warfarin was held.  INR today continues to rise at 6.17. Hgb down (12.9-12) and platelets down from yesterday [phone]).  Will hold tonight's dose.  Goal of Therapy:   INR 2-3 Monitor platelets by anticoagulation protocol: Yes   Plan:  Hold warfarin dose tonight x 1 Daily INR  Negan Grudzien L. Nicole Kindred, PharmD Clinical Pharmacy Resident Pager: [phone] 01/18/2014 1:03 PM

## 2014-01-18 NOTE — Progress Notes (Signed)
Subjective:    Currently, the patient reports feeling well except for feeling light-headed.  He is also requesting a regular diet otherwise he will leave AMA.  He says he tolerated the renal diet this morning, but he thinks he needs more to eat.  He is continuing to have some suprapubic discomfort and trouble urinating.  He denies chest pain, palpitations, SOB, or leg swelling currently.  Interval Events: -Started on Bactrim, but switched to levofloxacin due to renal disease. -HR varying widely from 55 to 132. -BP 80s-130s/60s-90s. -Received metoprolol 25 mg this morning.   Objective:    Vital Signs:   Temp:  [97.5 F (36.4 C)-98.7 F (37.1 C)] 98.1 F (36.7 C) (09/01 0752) Pulse Rate:  [25-132] 132 (09/01 1004) Resp:  [10-34] 34 (09/01 0936) BP: (87-139)/(57-110) 109/73 mmHg (09/01 1004) SpO2:  [90 %-100 %] 100 % (09/01 0936) Weight:  [158 lb 15.2 oz (72.1 kg)-166 lb 0.1 oz (75.3 kg)] 166 lb 0.1 oz (75.3 kg) (09/01 0403) Last BM Date: 01/10/14  24-hour weight change: Weight change:   Intake/Output:   Intake/Output Summary (Last 24 hours) at 01/18/14 1117 Last data filed at 01/18/14 0820  Gross per 24 hour  Intake 1703.33 ml  Output    525 ml  Net 1178.33 ml      Physical Exam: General: Vital signs reviewed and noted. Well-developed, well-nourished, in no acute distress; alert, appropriate and cooperative throughout examination.  Lungs:  Normal respiratory effort. Clear to auscultation BL without crackles or wheezes.  Heart: Tachycardic, irregular, without gallop, murmur, or rubs.  Abdomen:  BS normoactive. Soft, Nondistended, non-tender.  No masses or organomegaly.  Extremities: RLE surgical site, clean, dry, intact. No pretibial edema, good distal pulses.     Labs:  Basic Metabolic Panel:  Recent Labs Lab 01/17/14 1642 01/17/14 2100 01/18/14 0128  NA 139 139 142  K 5.6* 6.2* 4.9  CL 101 100 102  CO2 22 17* 24  GLUCOSE 93 105* 74  BUN 66* 64* 62*    CREATININE 3.00* 2.66* 2.67*  CALCIUM 9.9 9.8 9.0    Liver Function Tests:  Recent Labs Lab 01/17/14 1642 01/17/14 2100 01/18/14 0128  AST 112* 109* 67*  ALT 59* 61* 49  ALKPHOS 242* 246* 213*  BILITOT 0.4 0.4 0.3  PROT 8.2 8.5* 7.3  ALBUMIN 3.2* 3.1* 2.9*   CBC:  Recent Labs Lab 01/17/14 1642 01/18/14 0128  WBC 8.4 9.0  NEUTROABS 5.5 5.4  HGB 12.9* 12.0*  HCT 39.9 37.2*  MCV 87.7 87.9  PLT 415* 397    Cardiac Enzymes:  Recent Labs Lab 01/17/14 2035 01/18/14 0128 01/18/14 0725  TROPONINI <0.30 <0.30 <0.30   Microbiology: Results for orders placed during the hospital encounter of 01/17/14  MRSA PCR SCREENING     Status: None   Collection Time    01/17/14  6:26 PM      Result Value Ref Range Status   MRSA by PCR NEGATIVE  NEGATIVE Final   Comment:            The GeneXpert MRSA Assay (FDA     approved for NASAL specimens     only), is one component of a     comprehensive MRSA colonization     surveillance program. It is not     intended to diagnose MRSA     infection nor to guide or     monitor treatment for     MRSA infections.    Coagulation Studies:  Recent Labs  01/17/14 1642 01/18/14 0128  LABPROT 52.0* 54.7*  INR 5.78* 6.17*     Other results: EKG: atrial fibrillation, rate 139.  Imaging: Dg Chest Port 1 View  01/17/2014   CLINICAL DATA:  Short of breath.  Atrial fibrillation.  EXAM: PORTABLE CHEST - 1 VIEW  COMPARISON:  06/02/2013.  FINDINGS: There is chronic pleural parenchymal opacity in the left mid and lower lung zone consistent with chronic scarring. Chronic apical pleural thickening. There is associated volume loss. The right lung is hyperexpanded but clear. Cardiac silhouette is normal in size. No mediastinal or hilar masses.  Right anterior chest wall sequential pacemaker is stable with both leads projecting in the right ventricle.  Bony thorax is intact.  IMPRESSION: No acute cardiopulmonary disease. Stable chronic findings in  the left lung.   Electronically Signed   By: Lajean Manes M.D.   On: 01/17/2014 21:08       Medications:    Infusions: . sodium chloride 100 mL/hr at 01/18/14 0900    Scheduled Medications: . buPROPion  75 mg Oral BID  . diltiazem  60 mg Oral 4 times per day  . lactose free nutrition  237 mL Oral TID WC  . [START ON 01/20/2014] levofloxacin  500 mg Oral Q48H  . metoprolol tartrate  25 mg Oral BID  . pantoprazole  40 mg Oral Daily  . polyethylene glycol  17 g Oral Daily  . sodium bicarbonate  1,300 mg Oral BID  . sodium chloride  3 mL Intravenous Q12H  . tiotropium  18 mcg Inhalation Daily    PRN Medications: albuterol, oxyCODONE, oxyCODONE-acetaminophen   Assessment/ Plan:    Principal Problem:   Atrial fibrillation with RVR Active Problems:   BRADYCARDIA-TACHYCARDIA SYNDROME   COPD (chronic obstructive pulmonary disease)   Long-term (current) use of anticoagulants   Chronic kidney disease   Non-ischemic cardiomyopathy   Severe malnutrition   PVD (peripheral vascular disease)  #Atrial fibrillation/flutter with RVR Rate likely aggravated by UTI and missing meds yesterday.  Rate still in 130s this morning after receiving metoprolol. He takes diltiazem ER 240 mg at home, and did not take this yesterday.  BP still slightly low in the 371I systolic, but this may improve as his rate is reduced.  Dry on exam, so would likely benefit from more fluid.  Will be careful given heart and renal disease.  He continues to be light-headed this morning, but his appetite has improved.  Wants a regular diet otherwise threatening to leave AMA.  TSH normal.  Troponins x3 and EKG neg for ischemia. Last echo on 12/16/13 with 45-50% EF. CXR no acute disease. -Consulted cardiology. -Restart home diltiazem, but as short-acting 60 mg q6h. -Continue metoprolol 25 mg BID. -Consider another echo per cardiology recommendations. -Regular diet per patient request.  #Uncomplicated UTI Patient with  continued urinary symptoms.  Started on Bactrim yesterday but renally cleared, so switched to levofloxacin this morning.  Afebrile, no flank pain, or CVA tenderness. -Continue levofloxacin for 7 days of total treatment (ending 01/24/14).  #Acute on chronic renal insufficiency (CKD stage III) Likely pre-renal due to dehydration and poor PO intake related to UTI. Creatinine improving this morning, down to 2.7 from 3.0.  Will continue to rehydrate. -Normal saline at 100 cc/h.  #Renal tubular acidosis Type IV Hyperkalemic on admission (6.2), but improved after receiving kayexalate to 4.9. Takes bicarb at home along with  -Continue to monitor BMP. -Continue home bicarbonate 1300 mg PO BID.  #Elevated  liver enzymes Transaminases trending down with Alk Phos remaining elevated.  GGT elevated as well.  Denies recent statin use.  -RUQ ultrasound to look for obstruction.  #Constipation No bowel movements for week with poor PO intake. -Continue home Miralax daily, assess for bowel movements.  #COPD No shortness of breath. -Continue albuterol and tiotropium  #Depression Mood stable. -Continue home bupropion.  #GERD No indigestion or burning. -Continue Protonix.   DVT PPX - Coumadin with INR goal 2-3  CODE STATUS - Full code.  CONSULTS PLACED - Cardiology.  DISPO - Disposition is deferred at this time, awaiting improvement of RVR.   Anticipated discharge in approximately 1-2 day(s).   The patient does have a current PCP (Axel Filler, MD) and does need an [facility] hospital follow-up appointment after discharge.    Is the [facility] hospital follow-up appointment a one-time only appointment? no.  Does the patient have transportation limitations that hinder transportation to clinic appointments? yes   SERVICE NEEDED AT [place]         Y = Yes, Blank = No PT:   OT:   RN:   Equipment:   Other:      Length of Stay: 1  day(s)   Signed: Arman Filter, MD  PGY-1, Internal Medicine Resident Pager: [phone] (7AM-5PM) 01/18/2014, 11:17 AM

## 2014-01-18 NOTE — Consult Note (Signed)
WOC wound consult note Reason for Consult: evaluation of RLE wound. Pt with embolectomy and fasciotomy 12/10/13.  The groin wound is well healed and he has a lateral fasciotomy site with some scabbing but healing well.  Wound type: surgical  Pressure Ulcer POA: No Measurement: healing incision, approximated with a few scabs and some superficial skin peeling  Wound bed: no open wound Drainage (amount, consistency, odor) none, some skin flaking noted on the dressing.  Periwound: intact Dressing procedure/placement/frequency: Nursing staff implemented a soft silicone dressing which is perfect to protect the area from any further injury.  I do not note any open areas and no drainage.  Continue silicone foam, change every 3 days and PRN soilage.   Discussed POC with patient and bedside nurse.  Re consult if needed, will not follow at this time. Thanks  Chiquetta Langner Kellogg, [place] [phone])

## 2014-01-18 NOTE — Progress Notes (Signed)
INITIAL NUTRITION ASSESSMENT  DOCUMENTATION CODES Per approved criteria  -Not Applicable   INTERVENTION: - Resource Breeze po TID, each supplement provides 250 kcal and 9 grams of protein - RD will continue to follow for nutrition care plan.  NUTRITION DIAGNOSIS: Inadequate oral intake related to decreased appetite and dizziness as evidenced by weight loss and reported intake less than estimated needs.   Goal: Pt to meet >/= 90% of their estimated nutrition needs   Monitor:  Weight trend, po intake, acceptance of supplements, labs  Reason for Assessment: Consult for poor po  68 y.o. male  Admitting Dx: Atrial fibrillation with RVR  ASSESSMENT: 68 year old gentleman with a history of nonischemic cardiomyopathy, bradycardia-tachycardia syndrome (with a pacer), COPD, stage III chronic kidney disease, Chronic A. Fib (on Coumadin), tobacco abuse, and recent admission (7/24-08/07/2013) for a right lower extremity DVT status post right femoral embolectomy and right lower extremity fasciotomy who presents with a week of decreased appetite and dizziness.  - Per chart history, pt has lost 5 lbs in the last month. Pt's usual body weight "fluctuates from 170-175." He reports that he did not have a good appetite prior to admission, but says that his appetite is very good now. He currently feels hungry and is excited about what is on the menu for lunch today. He says that he is concerned with his weight loss and feels that he would benefit from nutritional supplements while in the hospital. - Pt with minor muscle wasting of the temple.   Labs: Na and K WNL BUN elevated CBGs: 74-161  Height: Ht Readings from Last 1 Encounters:  01/17/14 6' 3" (1.905 m)    Weight: Wt Readings from Last 1 Encounters:  01/18/14 166 lb 0.1 oz (75.3 kg)    Ideal Body Weight: 84.5 kg  % Ideal Body Weight: 89%  Wt Readings from Last 10 Encounters:  01/18/14 166 lb 0.1 oz (75.3 kg)  01/17/14 163 lb  (73.936 kg)  01/04/14 171 lb 8 oz (77.792 kg)  12/30/13 175 lb (79.379 kg)  12/20/13 177 lb 4 oz (80.4 kg)  12/20/13 177 lb 4 oz (80.4 kg)  12/20/13 177 lb 4 oz (80.4 kg)  10/05/13 179 lb 8 oz (81.421 kg)  09/07/13 175 lb 9.6 oz (79.652 kg)  08/13/13 174 lb 8 oz (79.153 kg)    Usual Body Weight: 170-175 lbs  % Usual Body Weight: 98%  BMI:  Body mass index is 20.75 kg/(m^2).  Estimated Nutritional Needs: Kcal: 2000-2200 Protein: 90-110 g Fluid: 1200 mL fluid restriction  Skin: closed incision on right leg  Diet Order: General  EDUCATION NEEDS: -Education needs addressed   Intake/Output Summary (Last 24 hours) at 01/18/14 1141 Last data filed at 01/18/14 0820  Gross per 24 hour  Intake 1703.33 ml  Output    525 ml  Net 1178.33 ml    Last BM: prior to admission  Labs:   Recent Labs Lab 01/17/14 1642 01/17/14 2100 01/18/14 0128  NA 139 139 142  K 5.6* 6.2* 4.9  CL 101 100 102  CO2 22 17* 24  BUN 66* 64* 62*  CREATININE 3.00* 2.66* 2.67*  CALCIUM 9.9 9.8 9.0  GLUCOSE 93 105* 74    CBG (last 3)  No results found for this basename: GLUCAP,  in the last 72 hours  Scheduled Meds: . buPROPion  75 mg Oral BID  . diltiazem  60 mg Oral 4 times per day  . lactose free nutrition  237 mL Oral TID  WC  . [START ON 01/20/2014] levofloxacin  500 mg Oral Q48H  . metoprolol tartrate  25 mg Oral BID  . pantoprazole  40 mg Oral Daily  . polyethylene glycol  17 g Oral Daily  . sodium bicarbonate  1,300 mg Oral BID  . sodium chloride  3 mL Intravenous Q12H  . tiotropium  18 mcg Inhalation Daily    Continuous Infusions: . sodium chloride 100 mL/hr at 01/18/14 0900    Past Medical History  Diagnosis Date  . Hypertension   . Chronic systolic heart failure     a. NICM - EF 35% with normal cors 2003. b. Last echo 11/2012 - EF 25-30%.  . Depression   . GERD (gastroesophageal reflux disease)   . Portal vein thrombosis   . Avascular necrosis     right hip s/p  replacement  . Gastritis   . Alcohol abuse   . Erectile dysfunction   . Bipolar disorder   . Hydrocele, unspecified   . Intermittent claudication   . Lung nodule     Chest CT scan on 12/14/2008 showed a nodular opacity at the left lung base felt to most likely represent scarring.  Follow-up chest CT scan on 06/02/2010 showed parenchymal scarring in the left apex, left  lower lobe, and lingula; some of this scarring at the left base had a nodular appearance, unchanged. Chest 09/2012 - stable.  . 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  Thyroid scan 08/28/2009 was normal with no focal areas of abnormal increased or decreased activity seen; the uptake of I 131 sodium iodide at 24 hours was 5.7%.  TSH and free T4 normalized by 08/16/2009.  . Intestinal obstruction   . COPD (chronic obstructive pulmonary disease)   . Clostridium difficile colitis   . E coli bacteremia   . DVT (deep venous thrombosis)     He has hypercoagulability with multiple prior DVTs  . Pleural plaque     H/o asbestos exposure. Chest CT on 06/02/2010 showed stable extensive calcified pleural plaques involving the left hemithorax, consistent with asbestos related pleural disease.  . Weight loss     Normal colonoscopy by Dr. Olevia Perches on 11/06/2010.  . Tachycardia-bradycardia syndrome     a. 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 b. St. Jude gen change 2010.  [name] PAF (paroxysmal atrial fibrillation)     Paroxysmal, failed medical therapy with amiodarone but has done very well with multaq;  d/c 2/2 to recurrent AFib;  Tikosyn load c/b Hyperkalemia during admx 05/2013 => rate control strategy  . 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  . Noncompliance   . Atrial flutter   . Thrombocytopenia   . Hyperkalemia     type 4 RTA  . Pacemaker   . Shortness of breath     "all the time now"  (07/02/2013)  . On home oxygen therapy     "2L prn" (07/02/2013)  . Pneumonia   . Cluster headaches     "~ twice/wk" (07/02/2013)  . Osteoarthritis   . Spondylosis   . Arthritis     "hips and legs" (07/02/2013)  . Chronic back pain     "all over my back" (07/02/2013)  . Anxiety   . CKD (chronic kidney disease)     Renal U/S 12/04/2009 showed no pathological findings. Labs 12/04/2009 include normal ESR, C3, C4; neg ANA; SPEP showed  nonspecific increase in the alpha-2 region with no M-spike; UPEP showed no monoclonal free light chains; urine IFE showed polyclonal increase in feree Kappa and/or free Lambda light chains. Baseline Cr reported 1.7-2.5.    Past Surgical History  Procedure Laterality Date  . Hemiarthroplasty hip Left 09/09/2002    bipolar; Dr. Lafayette Dragon  . Pacemaker insertion  02/2003    Dr. Roe Rutherford, [place] pulse generator identity SR model [id number] serial [id number]; gen change by Greggory Brandy  . Total hip arthroplasty Right 03/08/2008     By Dr. Hiram Comber  . Tee without cardioversion N/A 05/27/2013    Procedure: TRANSESOPHAGEAL ECHOCARDIOGRAM (TEE);  Surgeon: Candee Furbish, MD;  Location[facility] ENDOSCOPY;  Service: Cardiovascular;  Laterality: N/A;  . Cardioversion N/A 05/27/2013    Procedure: CARDIOVERSION;  Surgeon: Candee Furbish, MD;  Location: [facility] ENDOSCOPY;  Service: Cardiovascular;  Laterality: N/A;  . Cardiac catheterization  2003    Nl cors, EF 35%  . Pacemaker placement  06/17/2008    Lead and generator change w/ St. Jude Medical Tendril ST model [id number] (serial  [id number]).       . Insert / replace / remove pacemaker    . Embolectomy Right 12/10/2013    Procedure: Right Femoral Embolectomy; Fasciotomy Right Lower Leg with Intraoperative arteriogram;  Surgeon: Rosetta Posner, MD;  Location: [place];  Service: Vascular;  Laterality: Right;  . Intraoperative arteriogram Right 12/10/2013    Procedure: INTRA OPERATIVE ARTERIOGRAM;  Surgeon: Rosetta Posner, MD;  Location: [place];   Service: Vascular;  Laterality: Right;  . Fasciotomy closure Right 12/15/2013    Procedure: LEG FASCIOTOMY CLOSURE;  Surgeon: Rosetta Posner, MD;  Location: [place];  Service: Vascular;  Laterality: Right;    Terrace Arabia RD, LDN

## 2014-01-18 NOTE — Progress Notes (Signed)
CRITICAL VALUE ALERT  Critical value received:  INR  6.1.   MD already aware of critical result.

## 2014-01-18 NOTE — H&P (Signed)
INTERNAL MEDICINE TEACHING ATTENDING NOTE  Day 1 of stay  Patient name: [name]  MRN: [id number] Date of birth: 1945-08-07   Key clinical points and exam                                                          68 y.o.with NICM, Tachy-Brady Syndrome s/p pacer, CKD3, Chronic Afib on coumadin, history of DVT s/p embolectomy, admitted from clinic with rapid Afib (200 in clinic), supratherapeutic INR and found to have UTI upon admission. Patient admits of urinary symptoms for the past couple days. Admits poor PO intake for the past month. Today on exam, he reports feeling better subjectively, his urinary symptoms are better. His exam is significant for irregular tachycardia, no murmur and soft, non-tender abdomen and no pedal edema. I have reviewed the chart, lab results, EKG, imaging and relevant notes of this patient.     Assessment and Plan                                                                      In addition to the HP by Dr Raelene Bott:   Rapid RVR with tachy brady syndrome - Agree with plan. Rapid rate likely dehydration, and infection related. Currently 130s - If the heart rate does not settle down we will consider going up on his blocker.   Supratherapeutic INR - Monitor INR daily, monitor for bleeding. Discussed this with Dr Johney Maine - he thinks that the patient is extremely non-compliant. Apparently, it has happened in the past that the patient will report compliance however his INR would be low, and so his coumadin dose would be increased which would lead to a higher INR when the patient actually takes his medicine. The patient has also has a high frequency of no-show visits.    UTI - Levofloxacin dosed according to coumadin interaction.   Acute on chronic kidney dysfunction - Likely secondary to poor PO intake and infection. Improving trend.   I have seen and evaluated this patient and discussed it with my IM resident team - Dr Trudee Kuster and Aundra Dubin.  Please see the rest of the plan per  resident note from today.   [place], [place] 01/18/2014, 12:06 PM.

## 2014-01-18 NOTE — Consult Note (Addendum)
ELECTROPHYSIOLOGY CONSULT NOTE    Patient ID: [name] MRN: [id number], DOB/AGE: 1945-10-06 68 y.o.  Admit date: 01/17/2014 Date of Consult: 01-18-14  Primary Physician: Axel Filler, MD Electrophysiologist: Kashayla Ungerer  Reason for Consultation: atrial fibrillation  HPI:  [name] is a 68 y.o. male with a past medical history significant for nonischemic cardiomyopathy, tachy-brady syndrome (s/p STJ VVI pacemaker), recurrent DVT and recent arterial thrombus (on Warfarin), COPD, and CKD.  He has failed medical therapy for atrial fibrillation with multaq, amiodarone, and tikoysn.  He was recently admitted (7/24-12/20/13) for RLE arterial thrombosis and underwent embolectomy and fasciotomy at that time.  He was discharged to home and did well for several days but then developed anorexia and orthostatic dizziness.  He presented to [facility] for further evaluation.  He was found to be in coarse atrial fibrillation (ventricular rates 100-140's).  Home medications for rate control include Metoprolol 49m twice daily and Diltiazem 2463mdaily at home. During admission, patient has been on Metoprolol but not Diltiazem.    Lab work is notable for negative cardiac enzymes, creatinine 2.67, AST 112, ALT 59 on admission (now improved), INR 6.17,  TSH normal.  UDS positive for THC, urinalysis notable for large proteins, large leukocytes, moderate Hgb.   EP has been asked to evaluate for treatment options.   He currently denies chest pain, shortness of breath, LE edema, frank syncope.  He is still complaining of being light headed especially with standing (orthostatics negative this admission).  ROS is otherwise negative except as outlined above.   Past Medical History  Diagnosis Date  . Hypertension   . Chronic systolic heart failure     a. NICM - EF 35% with normal cors 2003. b. Last echo 11/2012 - EF 25-30%.  . Depression   . GERD (gastroesophageal reflux disease)   . Portal vein thrombosis   .  Avascular necrosis     right hip s/p replacement  . Gastritis   . Alcohol abuse   . Erectile dysfunction   . Bipolar disorder   . Hydrocele, unspecified   . Intermittent claudication   . Lung nodule     Chest CT scan on 12/14/2008 showed a nodular opacity at the left lung base felt to most likely represent scarring.  Follow-up chest CT scan on 06/02/2010 showed parenchymal scarring in the left apex, left  lower lobe, and lingula; some of this scarring at the left base had a nodular appearance, unchanged. Chest 09/2012 - stable.  . 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  Thyroid scan 08/28/2009 was normal with no focal areas of abnormal increased or decreased activity seen; the uptake of I 131 sodium iodide at 24 hours was 5.7%.  TSH and free T4 normalized by 08/16/2009.  . Intestinal obstruction   . COPD (chronic obstructive pulmonary disease)   . Clostridium difficile colitis   . E coli bacteremia   . DVT (deep venous thrombosis)     He has hypercoagulability with multiple prior DVTs  . Pleural plaque     H/o asbestos exposure. Chest CT on 06/02/2010 showed stable extensive calcified pleural plaques involving the left hemithorax, consistent with asbestos related pleural disease.  . Weight loss     Normal colonoscopy by Dr. BrOlevia Perchesn 11/06/2010.  . Tachycardia-bradycardia syndrome     a. 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 b. St. Jude gen change 2010.  . Marland KitchenAF (paroxysmal atrial fibrillation)     Paroxysmal, failed medical  therapy with amiodarone but has done very well with multaq;  d/c 2/2 to recurrent AFib;  Tikosyn load c/b Hyperkalemia during admx 05/2013 => rate control strategy  . 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  . Noncompliance   . Atrial flutter   . Thrombocytopenia   . Hyperkalemia     type 4 RTA  . Pacemaker   . Shortness of  breath     "all the time now" (07/02/2013)  . On home oxygen therapy     "2L prn" (07/02/2013)  . Pneumonia   . Cluster headaches     "~ twice/wk" (07/02/2013)  . Osteoarthritis   . Spondylosis   . Arthritis     "hips and legs" (07/02/2013)  . Chronic back pain     "all over my back" (07/02/2013)  . Anxiety   . CKD (chronic kidney disease)     Renal U/S 12/04/2009 showed no pathological findings. Labs 12/04/2009 include normal ESR, C3, C4; neg ANA; SPEP showed nonspecific increase in the alpha-2 region with no M-spike; UPEP showed no monoclonal free light chains; urine IFE showed polyclonal increase in feree Kappa and/or free Lambda light chains. Baseline Cr reported 1.7-2.5.     Surgical History:  Past Surgical History  Procedure Laterality Date  . Hemiarthroplasty hip Left 09/09/2002    bipolar; Dr. [place] Dragon  . Pacemaker insertion  02/2003    Dr. Roe Rutherford, [place] pulse generator identity SR model [id number] serial [id number]; gen change by Greggory Brandy  . Total hip arthroplasty Right 03/08/2008     By Dr. Hiram Comber  . Tee without cardioversion N/A 05/27/2013    Procedure: TRANSESOPHAGEAL ECHOCARDIOGRAM (TEE);  Surgeon: Candee Furbish, MD;  Location: [facility] ENDOSCOPY;  Service: Cardiovascular;  Laterality: N/A;  . Cardioversion N/A 05/27/2013    Procedure: CARDIOVERSION;  Surgeon: Candee Furbish, MD;  Location: [facility] ENDOSCOPY;  Service: Cardiovascular;  Laterality: N/A;  . Cardiac catheterization  2003    Nl cors, EF 35%  . Pacemaker placement  06/17/2008    Lead and generator change w/ St. Jude Medical Tendril ST model [id number] (serial  [id number]).       . Insert / replace / remove pacemaker    . Embolectomy Right 12/10/2013    Procedure: Right Femoral Embolectomy; Fasciotomy Right Lower Leg with Intraoperative arteriogram;  Surgeon: Rosetta Posner, MD;  Location: [place];  Service: Vascular;  Laterality: Right;  . Intraoperative arteriogram Right 12/10/2013    Procedure: INTRA OPERATIVE ARTERIOGRAM;   Surgeon: Rosetta Posner, MD;  Location: [place];  Service: Vascular;  Laterality: Right;  . Fasciotomy closure Right 12/15/2013    Procedure: LEG FASCIOTOMY CLOSURE;  Surgeon: Rosetta Posner, MD;  Location: [place];  Service: Vascular;  Laterality: Right;     Prescriptions prior to admission  Medication Sig Dispense Refill  . albuterol (PROVENTIL HFA;VENTOLIN HFA) 108 (90 BASE) MCG/ACT inhaler Inhale 2 puffs into the lungs every 6 (six) hours as needed for wheezing or shortness of breath.      [name] buPROPion (WELLBUTRIN) 75 MG tablet Take 1 tablet (75 mg total) by mouth 2 (two) times daily.  60 tablet  5  . diltiazem (DILACOR XR) 240 MG 24 hr capsule Take 240 mg by mouth daily.      . metoprolol succinate (TOPROL-XL) 50 MG 24 hr tablet Take 1 tablet (50 mg total) by mouth 2 (two)  times daily. Take with or immediately following a meal.  180 tablet  1  . omeprazole (PRILOSEC) 20 MG capsule Take 2 capsules (40 mg total) by mouth daily.  60 capsule  6  . oxyCODONE-acetaminophen (PERCOCET) 10-325 MG per tablet Take 1 tablet by mouth every 4 (four) hours as needed for pain. Do not take more than 5 doses in a 24 hour period.  140 tablet  0  . sodium bicarbonate 650 MG tablet Take 2 tablets (1,300 mg total) by mouth 2 (two) times daily.  120 tablet  0  . tiotropium (SPIRIVA) 18 MCG inhalation capsule Place 1 capsule (18 mcg total) into inhaler and inhale daily.  30 capsule  11  . warfarin (COUMADIN) 5 MG tablet Take 7.5-10 mg by mouth daily. Take 42m on Monday and Friday, 7.553mall other days        Inpatient Medications:  . buPROPion  75 mg Oral BID  . diltiazem  60 mg Oral 4 times per day  . feeding supplement (RESOURCE BREEZE)  1 Container Oral TID BM  . lactose free nutrition  237 mL Oral TID WC  . [START ON 01/20/2014] levofloxacin  500 mg Oral Q48H  . metoprolol tartrate  25 mg Oral BID  . pantoprazole  40 mg Oral Daily  . polyethylene glycol  17 g Oral Daily  . sodium bicarbonate  1,300 mg Oral BID  .  sodium chloride  3 mL Intravenous Q12H  . tiotropium  18 mcg Inhalation Daily    Allergies:  Allergies  Allergen Reactions  . Digoxin And Related Other (See Comments)    Dig toxicity 2009  . Penicillins Rash    History   Social History  . Marital Status: Widowed    Spouse Name: N/A    Number of Children: 10  . Years of Education: N/A   Occupational History  . RETIRED    Social History Main Topics  . Smoking status: Former Smoker -- 1.50 packs/day for 54 years    Types: Cigarettes    Quit date: 06/08/2013  . Smokeless tobacco: Never Used  . Alcohol Use: No     Comment: 07/02/2013 "h/o etoh abuse; stopped drinking ~ 2012"  . Drug Use: No     Comment: 07/02/2013 "stopped marijuana   . Sexual Activity: Not Currently   Other Topics Concern  . 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 Hypertension Mother   . Cancer Brother     Unsure of type.  . Asthma Mother   . Coronary artery disease Mother   . Colon cancer Neg Hx   . Lung cancer Neg Hx   . Prostate cancer Neg Hx     BP 106/84  Pulse 76  Temp(Src) 98.6 F (37 C) (Oral)  Resp 18  Ht _0  (1.905 m)  Wt 166 lb 0.1 oz (75.3 kg)  BMI 20.75 kg/m2  SpO2 100%  Physical Exam: Physical Exam: Filed Vitals:   01/18/14 1004 01/18/14 1221 01/18/14 1234 01/18/14 1425  BP: 109/73 106/84 106/84   Pulse: 132  76    Temp:  98.6 F (37 C)    TempSrc:  Oral    Resp:  24  18  Height:      Weight:      SpO2:  100%      GEN- The patient is well appearing, alert and oriented x 3 today.   Head- normocephalic, atraumatic Eyes-  Sclera clear, conjunctiva pink Ears- hearing intact Oropharynx- clear Neck- supple, Lungs- Clear to ausculation bilaterally,  normal work of breathing Heart- irregular rate and rhythm  GI- soft, NT, ND, + BS Extremities- no clubbing, cyanosis, or edema  MS- no significant deformity or atrophy Skin- no rash or lesion Psych- euthymic mood, full affect Neuro- strength and sensation are intact    Labs:   Lab Results  Component Value Date   WBC 9.0 01/18/2014   HGB 12.0* 01/18/2014   HCT 37.2* 01/18/2014   MCV 87.9 01/18/2014   PLT 397 01/18/2014     Recent Labs Lab 01/18/14 0128  NA 142  K 4.9  CL 102  CO2 24  BUN 62*  CREATININE 2.67*  CALCIUM 9.0  PROT 7.3  BILITOT 0.3  ALKPHOS 213*  ALT 49  AST 67*  GLUCOSE 74    Radiology/Studies: Dg Chest Port 1 View 01/17/2014   CLINICAL DATA:  Short of breath.  Atrial fibrillation.  EXAM: PORTABLE CHEST - 1 VIEW  COMPARISON:  06/02/2013.  FINDINGS: There is chronic pleural parenchymal opacity in the left mid and lower lung zone consistent with chronic scarring. Chronic apical pleural thickening. There is associated volume loss. The right lung is hyperexpanded but clear. Cardiac silhouette is normal in size. No mediastinal or hilar masses.  Right anterior chest wall sequential pacemaker is stable with both leads projecting in the right ventricle.  Bony thorax is intact.  IMPRESSION: No acute cardiopulmonary disease. Stable chronic findings in the left lung.   Electronically Signed   By: Lajean Manes M.D.   On: 01/17/2014 21:08   AST:MHDQQI flutter, ventricular rate 139, QRS 96  TELEMETRY: coarse atrial fibrillation, ventricular rates 120's  DEVICE HISTORY: STJ Zephyr single chamber pacemaker implanted 2010  A/P  1. Tachy/brady [place] is well known to me.  Rate control has been very difficult previously and complicated by prior noncompliance.  For now, I would recommend increase metoprolol to 55m BID.  He can restart prior diltiazem CD 2434mdaily.  I will have our research coordinator screen him for Genetic AF. He is not a candidate for invasive EP  procedures  2. Long standing Persistent afib Continue rate control as our strategy at this time Continue life long anticoagulation  3. Chronic systolic dysfunction Appears slightly dry Not volume overloaded Continue current rate control  OK to transfer to telemetry at this time

## 2014-01-19 ENCOUNTER — Inpatient Hospital Stay (HOSPITAL_COMMUNITY): Payer: Medicare HMO

## 2014-01-19 DIAGNOSIS — G8929 Other chronic pain: Secondary | ICD-10-CM

## 2014-01-19 DIAGNOSIS — N2589 Other disorders resulting from impaired renal tubular function: Secondary | ICD-10-CM

## 2014-01-19 DIAGNOSIS — N183 Chronic kidney disease, stage 3 unspecified: Secondary | ICD-10-CM

## 2014-01-19 LAB — COMPREHENSIVE METABOLIC PANEL
ALK PHOS: 145 U/L — AB (ref 39–117)
ALT: 26 U/L (ref 0–53)
AST: 24 U/L (ref 0–37)
Albumin: 2.3 g/dL — ABNORMAL LOW (ref 3.5–5.2)
Anion gap: 13 (ref 5–15)
BILIRUBIN TOTAL: 0.2 mg/dL — AB (ref 0.3–1.2)
BUN: 40 mg/dL — ABNORMAL HIGH (ref 6–23)
CHLORIDE: 110 meq/L (ref 96–112)
CO2: 20 mEq/L (ref 19–32)
Calcium: 8.3 mg/dL — ABNORMAL LOW (ref 8.4–10.5)
Creatinine, Ser: 1.86 mg/dL — ABNORMAL HIGH (ref 0.50–1.35)
GFR calc non Af Amer: 35 mL/min — ABNORMAL LOW (ref >90)
GFR, EST AFRICAN AMERICAN: 41 mL/min — AB (ref >90)
GLUCOSE: 111 mg/dL — AB (ref 70–99)
POTASSIUM: 5.2 meq/L (ref 3.7–5.3)
SODIUM: 143 meq/L (ref 137–147)
TOTAL PROTEIN: 6.1 g/dL (ref 6.0–8.3)

## 2014-01-19 LAB — PROTIME-INR
INR: 4.76 — ABNORMAL HIGH (ref 0.00–1.49)
Prothrombin Time: 44.7 seconds — ABNORMAL HIGH (ref 11.6–15.2)

## 2014-01-19 LAB — APTT: aPTT: 74 seconds — ABNORMAL HIGH (ref 24–37)

## 2014-01-19 MED ORDER — SODIUM CHLORIDE 0.9 % IV SOLN: INTRAVENOUS | Status: AC

## 2014-01-19 MED ORDER — TAMSULOSIN HCL 0.4 MG PO CAPS
0.4000 mg | ORAL_CAPSULE | Freq: Every day | ORAL | Status: DC
  Administered 2014-01-19 – 2014-01-21 (×3): 0.4 mg via ORAL
  Filled 2014-01-19 (×4): qty 1

## 2014-01-19 NOTE — Progress Notes (Signed)
Subjective:    [name] reports improvement of his energy level and appetite today.  He continues to feel light-headed and is having difficulty urinating.  He has trouble starting urinating, frequency, and discomfort with urination.  He has chronic pain in his hips and lower legs.  He denies palpitations, chest pain, or shortness of breath.  Interval Events: -HR down to 110-120s this morning, afebrile. -Restarted on diltiazem yesterday, metoprolol increased to 50 mg BID. -RUQ U/S this morning, pending. -Alk phos down to 145 from 213. -Cr improved to 1.86 from 2.67 (Baseline 1.6)   Objective:    Vital Signs:   Temp:  [97.8 F (36.6 C)-98.6 F (37 C)] 98.1 F (36.7 C) (09/02 0709) Pulse Rate:  [27-152] 118 (09/02 0709) Resp:  [0-34] 27 (09/02 0700) BP: (85-152)/(58-137) 120/90 mmHg (09/02 0700) SpO2:  [93 %-100 %] 97 % (09/02 0700) Last BM Date: 01/10/14  24-hour weight change: Weight change:   Intake/Output:   Intake/Output Summary (Last 24 hours) at 01/19/14 0801 Last data filed at 01/19/14 0600  Gross per 24 hour  Intake   1900 ml  Output   1400 ml  Net    500 ml      Physical Exam: General: Vital signs reviewed and noted. Well-developed, well-nourished, in no acute distress; alert, appropriate and cooperative throughout examination.  Lungs:  Normal respiratory effort. Clear to auscultation BL without crackles or wheezes.  Heart: Tachycardic, irregular, without gallop, murmur, or rubs.  Abdomen:  Suprapubic tenderness. BS normoactive. Soft, Nondistended.  No masses or organomegaly.  Extremities: RLE surgical site, clean, dry, intact. No pretibial edema, good distal pulses.     Labs:  Basic Metabolic Panel:  Recent Labs Lab 01/17/14 1642 01/17/14 2100 01/18/14 0128 01/19/14 0241  NA 139 139 142 143  K 5.6* 6.2* 4.9 5.2  CL 101 100 102 110  CO2 22 17* 24 20  GLUCOSE 93 105* 74 111*  BUN 66* 64* 62* 40*  CREATININE 3.00* 2.66* 2.67* 1.86*  CALCIUM  9.9 9.8 9.0 8.3*    Liver Function Tests:  Recent Labs Lab 01/17/14 1642 01/17/14 2100 01/18/14 0128 01/19/14 0241  AST 112* 109* 67* 24  ALT 59* 61* 49 26  ALKPHOS 242* 246* 213* 145*  BILITOT 0.4 0.4 0.3 0.2*  PROT 8.2 8.5* 7.3 6.1  ALBUMIN 3.2* 3.1* 2.9* 2.3*   CBC:  Recent Labs Lab 01/17/14 1642 01/18/14 0128  WBC 8.4 9.0  NEUTROABS 5.5 5.4  HGB 12.9* 12.0*  HCT 39.9 37.2*  MCV 87.7 87.9  PLT 415* 397    Cardiac Enzymes:  Recent Labs Lab 01/17/14 2035 01/18/14 0128 01/18/14 0725  TROPONINI <0.30 <0.30 <0.30   Microbiology: Results for orders placed during the hospital encounter of 01/17/14  MRSA PCR SCREENING     Status: None   Collection Time    01/17/14  6:26 PM      Result Value Ref Range Status   MRSA by PCR NEGATIVE  NEGATIVE Final   Comment:            The GeneXpert MRSA Assay (FDA     approved for NASAL specimens     only), is one component of a     comprehensive MRSA colonization     surveillance program. It is not     intended to diagnose MRSA     infection nor to guide or     monitor treatment for     MRSA infections.    Coagulation  Studies:  Recent Labs  01/17/14 1642 01/18/14 0128 01/19/14 0241  LABPROT 52.0* 54.7* 44.7*  INR 5.78* 6.17* 4.76*    Imaging: Dg Chest Port 1 View  01/17/2014   CLINICAL DATA:  Short of breath.  Atrial fibrillation.  EXAM: PORTABLE CHEST - 1 VIEW  COMPARISON:  06/02/2013.  FINDINGS: There is chronic pleural parenchymal opacity in the left mid and lower lung zone consistent with chronic scarring. Chronic apical pleural thickening. There is associated volume loss. The right lung is hyperexpanded but clear. Cardiac silhouette is normal in size. No mediastinal or hilar masses.  Right anterior chest wall sequential pacemaker is stable with both leads projecting in the right ventricle.  Bony thorax is intact.  IMPRESSION: No acute cardiopulmonary disease. Stable chronic findings in the left lung.    Electronically Signed   By: Lajean Manes M.D.   On: 01/17/2014 21:08       Medications:    Infusions:    Scheduled Medications: . buPROPion  75 mg Oral BID  . diltiazem  240 mg Oral Daily  . feeding supplement (RESOURCE BREEZE)  1 Container Oral TID BM  . lactose free nutrition  237 mL Oral TID WC  . [START ON 01/20/2014] levofloxacin  500 mg Oral Q48H  . metoprolol tartrate  50 mg Oral BID  . pantoprazole  40 mg Oral Daily  . polyethylene glycol  17 g Oral Daily  . sodium bicarbonate  1,300 mg Oral BID  . sodium chloride  3 mL Intravenous Q12H  . tiotropium  18 mcg Inhalation Daily    PRN Medications: albuterol, oxyCODONE, oxyCODONE-acetaminophen   Assessment/ Plan:    Principal Problem:   Atrial fibrillation with RVR Active Problems:   BRADYCARDIA-TACHYCARDIA SYNDROME   COPD (chronic obstructive pulmonary disease)   Long-term (current) use of anticoagulants   Chronic kidney disease   Non-ischemic cardiomyopathy   Severe malnutrition   PVD (peripheral vascular disease)  #Atrial fibrillation/flutter with RVR Rate slightly improved on home meds with increase in metoprolol.  Still elevated, likely due to UTI.  He says this is good rate control for his because he has run in the 150s in the past.  Cardiology following.  Normal saline stopped this morning, but he continues to feel light-headed possibly due to rate. Orthostatics were negative yesterday, but he would likely benefit from more fluid.  INR 4.76 this morning. -Appreciate cardiology recs. -Switch to diltiazem ER 240 mg daily today. -Continue metoprolol 25 mg BID. -Regular diet per patient request. -Orthostatics. -NS at 100 ml/h for today. -Resource Breeze TID per nutrition recs. -Coumadin per pharmacy consult.  #Uncomplicated UTI Urinary symptoms continue, but patient is afebrile.  Awaiting culture results.  Unlikely to be prostatitis due to lack of systemic symptoms such as fever and chills.  Patient may have  underlying BPH that is contributing. -Continue levofloxacin for planned 7 days of total treatment (ending 01/24/14).  #Acute on chronic renal insufficiency (CKD stage III) Creatinine 1.86 today after rehydration, which is close to his baseline of 1.6.  Suggests this was prerenal. -Normal saline at 100 cc/h. -Monitor Cr.  #Renal tubular acidosis Type IV K of 5.2 today, on home bicarb.  Has taken Lasix in the past, but not currently. -Continue to monitor K. -Continue home bicarbonate 1300 mg PO BID.  #Elevated alk phos Alk phos trending down and liver enzymes normal.  Had RUQ ultrasound this morning. -Follow up RUQ ultrasound  #Constipation On Miralax at home. -Continue home Miralax daily, assess  for bowel movements.  #Chronic pain Back and leg pain, asking for home meds. -Continue home Percocet 10-325 q4h PRN.  #COPD No shortness of breath. -Continue albuterol and tiotropium  #Depression Mood stable. -Continue home bupropion.  #GERD No indigestion or burning. -Continue Protonix.   DVT PPX - Coumadin with INR goal 2-3  CODE STATUS - Full code.  CONSULTS PLACED - Cardiology.  DISPO - Disposition is deferred at this time, awaiting improvement of RVR.   Anticipated discharge in approximately 1-2 day(s).   The patient does have a current PCP (Axel Filler, MD) and does need an [facility] hospital follow-up appointment after discharge.    Is the [facility] hospital follow-up appointment a one-time only appointment? no.  Does the patient have transportation limitations that hinder transportation to clinic appointments? yes   SERVICE NEEDED AT [place]         Y = Yes, Blank = No PT:   OT:   RN:   Equipment:   Other:      Length of Stay: 2 day(s)   Signed: Arman Filter, MD  PGY-1, Internal Medicine Resident Pager: [phone] (7AM-5PM) 01/19/2014, 8:01 AM

## 2014-01-19 NOTE — Progress Notes (Addendum)
SUBJECTIVE: The patient is doing well today.  At this time, he denies chest pain, shortness of breath, or any new concerns.  [name] buPROPion  75 mg Oral BID  . diltiazem  240 mg Oral Daily  . feeding supplement (RESOURCE BREEZE)  1 Container Oral TID BM  . lactose free nutrition  237 mL Oral TID WC  . [START ON 01/20/2014] levofloxacin  500 mg Oral Q48H  . metoprolol tartrate  50 mg Oral BID  . pantoprazole  40 mg Oral Daily  . polyethylene glycol  17 g Oral Daily  . sodium bicarbonate  1,300 mg Oral BID  . sodium chloride  3 mL Intravenous Q12H  . tamsulosin  0.4 mg Oral QPC supper  . tiotropium  18 mcg Inhalation Daily   . sodium chloride 100 mL/hr at 01/19/14 0900    OBJECTIVE: Physical Exam: Filed Vitals:   01/19/14 1100 01/19/14 1212 01/19/14 1230 01/19/14 1254  BP: 103/78 95/72  102/83  Pulse:  115  126  Temp:  97.7 F (36.5 C)  98.3 F (36.8 C)  TempSrc:  Oral  Oral  Resp: 14 24 14 16   Height:      Weight:      SpO2:  98%  100%    Intake/Output Summary (Last 24 hours) at 01/19/14 1402 Last data filed at 01/19/14 1200  Gross per 24 hour  Intake   2200 ml  Output   1500 ml  Net    700 ml    Telemetry reveals atrial flutter with RVR  GEN- The patient is well appearing, alert and oriented x 3 today.   Head- normocephalic, atraumatic Eyes-  Sclera clear, conjunctiva pink Ears- hearing intact Oropharynx- clear Neck- supple  Lungs- Clear to ausculation bilaterally, normal work of breathing Heart- tachycardic irregular rhythm  GI- soft, NT, ND, + BS Extremities- no clubbing, cyanosis, or edema Skin- no rash or lesion Psych- euthymic mood, full affect Neuro- strength and sensation are intact  LABS: Basic Metabolic Panel:  Recent Labs  01/18/14 0128 01/19/14 0241  NA 142 143  K 4.9 5.2  CL 102 110  CO2 24 20  GLUCOSE 74 111*  BUN 62* 40*  CREATININE 2.67* 1.86*  CALCIUM 9.0 8.3*   Liver Function Tests:  Recent Labs  01/18/14 0128  01/19/14 0241  AST 67* 24  ALT 49 26  ALKPHOS 213* 145*  BILITOT 0.3 0.2*  PROT 7.3 6.1  ALBUMIN 2.9* 2.3*   No results found for this basename: LIPASE, AMYLASE,  in the last 72 hours CBC:  Recent Labs  01/17/14 1642 01/18/14 0128  WBC 8.4 9.0  NEUTROABS 5.5 5.4  HGB 12.9* 12.0*  HCT 39.9 37.2*  MCV 87.7 87.9  PLT 415* 397   Cardiac Enzymes:  Recent Labs  01/17/14 2035 01/18/14 0128 01/18/14 0725  TROPONINI <0.30 <0.30 <0.30   Thyroid Function Tests:  Recent Labs  01/18/14 0128  TSH 2.290   Anemia Panel: No results found for this basename: VITAMINB12, FOLATE, FERRITIN, TIBC, IRON, RETICCTPCT,  in the last 72 hours  RADIOLOGY: Dg Chest Port 1 View  01/17/2014   CLINICAL DATA:  Short of breath.  Atrial fibrillation.  EXAM: PORTABLE CHEST - 1 VIEW  COMPARISON:  06/02/2013.  FINDINGS: There is chronic pleural parenchymal opacity in the left mid and lower lung zone consistent with chronic scarring. Chronic apical pleural thickening. There is associated volume loss. The right lung is hyperexpanded but clear. Cardiac silhouette is normal in size.  No mediastinal or hilar masses.  Right anterior chest wall sequential pacemaker is stable with both leads projecting in the right ventricle.  Bony thorax is intact.  IMPRESSION: No acute cardiopulmonary disease. Stable chronic findings in the left lung.   Electronically Signed   By: Lajean Manes M.D.   On: 01/17/2014 21:08   US Abdomen Limited Ruq  01/19/2014   CLINICAL DATA:  Elevated liver function tests.  EXAM: US ABDOMEN LIMITED - RIGHT UPPER QUADRANT  COMPARISON:  Ultrasound of June 17, 2013.  FINDINGS: Gallbladder:  No gallstones or wall thickening visualized. No sonographic Murphy sign noted. Mild amount of sludge is present within the gallbladder lumen.  Common bile duct:  Diameter: 4.8 mm.  Liver:  Heterogeneous echotexture of hepatic parenchyma is noted suggesting diffuse hepatocellular disease or possibly fatty  infiltration. No focal abnormality is noted.  IMPRESSION: Gallbladder sludge is noted, but no gallstones or evidence of cholecystitis is noted.  Heterogeneous echotexture of hepatic parenchyma is noted suggesting diffuse hepatocellular disease or possibly fatty infiltration.   Electronically Signed   By: Sabino Dick M.D.   On: 01/19/2014 08:30    ASSESSMENT AND PLAN:  Principal Problem:   Atrial fibrillation with RVR Active Problems:   BRADYCARDIA-TACHYCARDIA SYNDROME   COPD (chronic obstructive pulmonary disease)   Long-term (current) use of anticoagulants   Chronic kidney disease   Non-ischemic cardiomyopathy   Severe malnutrition   PVD (peripheral vascular disease)   1. Tachy/brady  Continue current therapy. Could take off telemetry.   I will have our research coordinator screen him for Genetic AF though I doubt that he will be a candidate given h/o noncompliance.  He is not a candidate for invasive EP procedures   2. Long standing Persistent afib  Continue rate control as our strategy at this time  Continue life long anticoagulation   3. Chronic systolic dysfunction  Appears slightly dry  Not volume overloaded  Continue current rate control    I would anticipate discharge soon   Thompson Grayer, MD 01/19/2014 2:02 PM   Addendum: The patient was seen by the Genetic AF research team.  He will need to demonstrate compliance with medicines and follow-up before he can be offered enrollment in this particular trial. [place] research team will see him again when he returns to see me in the office in 4 weeks.  I can manage his afib as an outpatient.  This is a chronic issue for him.  If other medical issues are stable, he could be discharged.

## 2014-01-19 NOTE — Progress Notes (Signed)
[facility]  Patient Status: Active (receiving services up to time of hospitalization)  [facility] is providing the following services: RN, PT and OT  If patient discharges after hours, please call [phone].   [name] 01/19/2014, 10:09 AM

## 2014-01-19 NOTE — Care Management (Signed)
CARE MANAGEMENT NOTE 01/19/2014  Patient:  [name], [name]   Account Number:  [id number]  Date Initiated:  01/18/2014  Documentation initiated by:  Elissa Hefty  Subjective/Objective Assessment:   adm w at fib     Action/Plan:   lives w friend, pcp dr Bertha Stakes, act w [facility] p[facility]   Anticipated DC Date:     Anticipated DC Plan:  [place]  CM consult      [facility]  Resumption Of Svcs/PTA Provider   Choice offered to / List presented to:          [facility] arranged  HH-1 RN  [place] agency  [place].   Status of service:  Completed, signed off Medicare Important Message given?   (If response is "NO", the following Medicare IM given date fields will be blank) Date Medicare IM given:   Medicare IM given by:   Date Additional Medicare IM given:   Additional Medicare IM given by:    Discharge Disposition:  [place]  Per UR Regulation:  Reviewed for med. necessity/level of care/duration of stay  If discussed at [place] of Stay Meetings, dates discussed:    Comments:  01-19-14 [address] Jacqlyn Krauss, RN,BSN [phone] CM did make additional referral with [facility] for services. CM will continue to monitor.

## 2014-01-19 NOTE — Progress Notes (Signed)
Indication: Atrial fibrillation. Duration: Lifelong. INR: Above target. Agree with Dr. Gladstone Pih assessment and plan.  Of note, pt. was admitted to the inpatient service for further assessment and care.

## 2014-01-19 NOTE — Progress Notes (Addendum)
Report called to receiving RN, Otila Kluver.  All questions answered.  Patient to be transferred from [facility] room 22 to 3W room 19.  Patient's chart, medications, and personal belongings all sent with patient.  Patient transported on tele monitor via wheelchair to new room location.  Safety measures maintained.  Doran Clay, RN

## 2014-01-19 NOTE — Progress Notes (Signed)
  PROGRESS NOTE MEDICINE TEACHING ATTENDING   Day 2 of stay Patient name: [name]   Medical record number: [id number] Date of birth: 07/11/1945   Met and examined patient. Doing clinically better. Reports some improvement in lower abdominal pain that started a week ago, but reports that he has also been having frequency, hesitency, poor stream and incomplete evacuation feeling for about a 2 months or more now.   Assessment and Plan                                                                        Afib - rate improved. We continue current treatment. Increase metoprolol to 50 BID.   UTI - Continue levofloxacin PO.  INR trending down. Hold coumadin. Arrange appointment with Dr Johney [place].  Start Flomax 0.4 mg oral daily for possible BPH.  I have discussed the care of this patient with my IM team residents - Dr Trudee Kuster and Aundra Dubin. Please see the resident note for details.  [place], [place] 01/19/2014, 12:52 PM.

## 2014-01-19 NOTE — Progress Notes (Signed)
ANTICOAGULATION CONSULT NOTE - Follow Up Consult  Pharmacy Consult for warfarin Indication: atrial fibrillation and arterial embolism/thrombus of RLE  Allergies  Allergen Reactions  . Digoxin And Related Other (See Comments)    Dig toxicity 2009  . Penicillins Rash    Patient Measurements: Height: 6\' 3"  (190.5 cm) Weight: 166 lb 0.1 oz (75.3 kg) IBW/kg (Calculated) : 84.5  Vital Signs: Temp: 98.1 F (36.7 C) (09/02 0709) Temp src: Oral (09/02 0709) BP: 97/79 mmHg (09/02 1000) Pulse Rate: 118 (09/02 0709)  Labs:  Recent Labs  01/17/14 1642 01/17/14 2035 01/17/14 2100 01/18/14 0128 01/18/14 0725 01/19/14 0241  HGB 12.9*  --   --  12.0*  --   --   HCT 39.9  --   --  37.2*  --   --   PLT 415*  --   --  397  --   --   APTT  --   --   --   --   --  74*  LABPROT 52.0*  --   --  54.7*  --  44.7*  INR 5.78*  --   --  6.17*  --  4.76*  CREATININE 3.00*  --  2.66* 2.67*  --  1.86*  TROPONINI  --  <0.30  --  <0.30 <0.30  --     Estimated Creatinine Clearance: 39.9 ml/min (by C-G formula based on Cr of 1.86).   Medications:  Scheduled:  . buPROPion  75 mg Oral BID  . diltiazem  240 mg Oral Daily  . feeding supplement (RESOURCE BREEZE)  1 Container Oral TID BM  . lactose free nutrition  237 mL Oral TID WC  . [START ON 01/20/2014] levofloxacin  500 mg Oral Q48H  . metoprolol tartrate  50 mg Oral BID  . pantoprazole  40 mg Oral Daily  . polyethylene glycol  17 g Oral Daily  . sodium bicarbonate  1,300 mg Oral BID  . sodium chloride  3 mL Intravenous Q12H  . tiotropium  18 mcg Inhalation Daily    Assessment: 68 yo m on warfarin for afib and h/o DVT presenting on 8/31 from the clinic with supratherapeutic INR of 5.78.  Recently discharged from hospital s/p hip replacement, R femoral embolectomy, intra-operative arteriogram and medical/lateral extremity fasciotomies. INR today is down from yesterday but still elevated at 4.76. No CBC today. Will hold dose again  tonight.  Goal of Therapy:  INR 2-3 Monitor platelets by anticoagulation protocol: Yes   Plan:  Hold warfarin dose again tonight x 1 Daily INR Monitor CBC, s/s of bleeding  Kenise Barraco L. Nicole Kindred, PharmD Clinical Pharmacy Resident Pager: [phone] 01/19/2014 10:52 AM

## 2014-01-20 ENCOUNTER — Ambulatory Visit: Payer: Medicare HMO | Admitting: Internal Medicine

## 2014-01-20 DIAGNOSIS — I509 Heart failure, unspecified: Secondary | ICD-10-CM

## 2014-01-20 DIAGNOSIS — I5042 Chronic combined systolic (congestive) and diastolic (congestive) heart failure: Secondary | ICD-10-CM

## 2014-01-20 DIAGNOSIS — N400 Enlarged prostate without lower urinary tract symptoms: Secondary | ICD-10-CM

## 2014-01-20 DIAGNOSIS — N390 Urinary tract infection, site not specified: Secondary | ICD-10-CM

## 2014-01-20 LAB — BASIC METABOLIC PANEL
Anion gap: 13 (ref 5–15)
Anion gap: 12 (ref 5–15)
BUN: 28 mg/dL — ABNORMAL HIGH (ref 6–23)
BUN: 24 mg/dL — ABNORMAL HIGH (ref 6–23)
CO2: 21 mEq/L (ref 19–32)
CO2: 21 meq/L (ref 19–32)
Calcium: 8.7 mg/dL (ref 8.4–10.5)
Calcium: 8.4 mg/dL (ref 8.4–10.5)
Chloride: 108 mEq/L (ref 96–112)
Chloride: 109 mEq/L (ref 96–112)
Creatinine, Ser: 1.61 mg/dL — ABNORMAL HIGH (ref 0.50–1.35)
Creatinine, Ser: 1.57 mg/dL — ABNORMAL HIGH (ref 0.50–1.35)
GFR calc Af Amer: 50 mL/min — ABNORMAL LOW (ref >90)
GFR calc non Af Amer: 42 mL/min — ABNORMAL LOW (ref >90)
GFR, EST AFRICAN AMERICAN: 49 mL/min — AB (ref >90)
GFR, EST NON AFRICAN AMERICAN: 43 mL/min — AB (ref >90)
GLUCOSE: 88 mg/dL (ref 70–99)
GLUCOSE: 103 mg/dL — AB (ref 70–99)
POTASSIUM: 5.4 meq/L — AB (ref 3.7–5.3)
POTASSIUM: 5.5 meq/L — AB (ref 3.7–5.3)
SODIUM: 142 meq/L (ref 137–147)
SODIUM: 142 meq/L (ref 137–147)

## 2014-01-20 LAB — URINE CULTURE: Colony Count: >=100000

## 2014-01-20 LAB — PROTIME-INR
INR: 2.22 — ABNORMAL HIGH (ref 0.00–1.49)
Prothrombin Time: 24.6 seconds — ABNORMAL HIGH (ref 11.6–15.2)

## 2014-01-20 MED ORDER — SODIUM POLYSTYRENE SULFONATE 15 GM/60ML PO SUSP
15.0000 g | Freq: Once | ORAL | Status: AC
  Administered 2014-01-20: 15 g via ORAL
  Filled 2014-01-20: qty 60

## 2014-01-20 MED ORDER — LEVOFLOXACIN 750 MG PO TABS
750.0000 mg | ORAL_TABLET | ORAL | Status: DC
  Filled 2014-01-20: qty 1

## 2014-01-20 MED ORDER — WARFARIN - PHARMACIST DOSING INPATIENT: Freq: Every day | Status: DC

## 2014-01-20 MED ORDER — BOOST / RESOURCE BREEZE PO LIQD: 1.0000 | Freq: Three times a day (TID) | ORAL | Status: DC

## 2014-01-20 MED ORDER — WARFARIN SODIUM 5 MG PO TABS: 5.0000 mg | ORAL_TABLET | Freq: Every day | ORAL | Status: DC

## 2014-01-20 MED ORDER — WARFARIN SODIUM 5 MG PO TABS
5.0000 mg | ORAL_TABLET | Freq: Every day | ORAL | Status: DC
  Filled 2014-01-20: qty 1

## 2014-01-20 MED ORDER — WARFARIN SODIUM 5 MG PO TABS
5.0000 mg | ORAL_TABLET | Freq: Every day | ORAL | Status: DC
  Administered 2014-01-20 – 2014-01-21 (×2): 5 mg via ORAL
  Filled 2014-01-20 (×2): qty 1

## 2014-01-20 MED ORDER — SODIUM POLYSTYRENE SULFONATE 15 GM/60ML PO SUSP
50.0000 g | Freq: Once | ORAL | Status: DC
  Filled 2014-01-20: qty 240

## 2014-01-20 MED ORDER — WARFARIN SODIUM 7.5 MG PO TABS
7.5000 mg | ORAL_TABLET | Freq: Once | ORAL | Status: DC
  Filled 2014-01-20: qty 1

## 2014-01-20 MED ORDER — WARFARIN SODIUM 2.5 MG PO TABS
2.5000 mg | ORAL_TABLET | Freq: Every day | ORAL | Status: DC
  Filled 2014-01-20: qty 1

## 2014-01-20 MED ORDER — TAMSULOSIN HCL 0.4 MG PO CAPS: 0.4000 mg | ORAL_CAPSULE | Freq: Every day | ORAL | Status: DC

## 2014-01-20 MED ORDER — METOPROLOL TARTRATE 50 MG PO TABS: 50.0000 mg | ORAL_TABLET | Freq: Two times a day (BID) | ORAL | Status: DC

## 2014-01-20 MED ORDER — BOOST PLUS PO LIQD: 237.0000 mL | Freq: Three times a day (TID) | ORAL | Status: DC

## 2014-01-20 MED ORDER — LEVOFLOXACIN 750 MG PO TABS: 750.0000 mg | ORAL_TABLET | ORAL | Status: AC

## 2014-01-20 NOTE — Progress Notes (Addendum)
SUBJECTIVE: The patient is doing well today.  At this time, he denies chest pain, shortness of breath, or any new concerns.  CURRENT MEDICATIONS: . buPROPion  75 mg Oral BID  . diltiazem  240 mg Oral Daily  . feeding supplement (RESOURCE BREEZE)  1 Container Oral TID BM  . lactose free nutrition  237 mL Oral TID WC  . levofloxacin  500 mg Oral Q48H  . metoprolol tartrate  50 mg Oral BID  . pantoprazole  40 mg Oral Daily  . polyethylene glycol  17 g Oral Daily  . sodium bicarbonate  1,300 mg Oral BID  . sodium chloride  3 mL Intravenous Q12H  . tamsulosin  0.4 mg Oral QPC supper  . tiotropium  18 mcg Inhalation Daily   . sodium chloride 100 mL/hr at 01/19/14 0900    OBJECTIVE: Physical Exam: Filed Vitals:   01/19/14 1254 01/19/14 2046 01/19/14 2124 01/20/14 0631  BP: 102/83 108/74  94/75  Pulse: 126 52 125 123  Temp: 98.3 F (36.8 C) 98.7 F (37.1 C)  98.5 F (36.9 C)  TempSrc: Oral Oral    Resp: 16 16  18   Height:      Weight:    165 lb 12.6 oz (75.2 kg)  SpO2: 100% 99%  100%    Intake/Output Summary (Last 24 hours) at 01/20/14 0733 Last data filed at 01/20/14 6294  Gross per 24 hour  Intake    780 ml  Output   1875 ml  Net  -1095 ml    Telemetry reveals atrial flutter with RVR, rates improved.   GEN- The patient is well appearing, alert and oriented x 3 today.   Head- normocephalic, atraumatic Eyes-  Sclera clear, conjunctiva pink Ears- hearing intact Oropharynx- clear Neck- supple  Lungs- Clear to ausculation bilaterally, normal work of breathing Heart- tachycardic irregular rhythm  GI- soft, NT, ND, + BS Extremities- no clubbing, cyanosis, or edema Neuro- strength and sensation are intact  LABS: Basic Metabolic Panel:  Recent Labs  01/18/14 0128 01/19/14 0241  NA 142 143  K 4.9 5.2  CL 102 110  CO2 24 20  GLUCOSE 74 111*  BUN 62* 40*  CREATININE 2.67* 1.86*  CALCIUM 9.0 8.3*   Liver Function Tests:  Recent Labs  01/18/14 0128  01/19/14 0241  AST 67* 24  ALT 49 26  ALKPHOS 213* 145*  BILITOT 0.3 0.2*  PROT 7.3 6.1  ALBUMIN 2.9* 2.3*   No results found for this basename: LIPASE, AMYLASE,  in the last 72 hours CBC:  Recent Labs  01/17/14 1642 01/18/14 0128  WBC 8.4 9.0  NEUTROABS 5.5 5.4  HGB 12.9* 12.0*  HCT 39.9 37.2*  MCV 87.7 87.9  PLT 415* 397   Cardiac Enzymes:  Recent Labs  01/17/14 2035 01/18/14 0128 01/18/14 0725  TROPONINI <0.30 <0.30 <0.30   Thyroid Function Tests:  Recent Labs  01/18/14 0128  TSH 2.290   Anemia Panel: No results found for this basename: VITAMINB12, FOLATE, FERRITIN, TIBC, IRON, RETICCTPCT,  in the last 72 hours  RADIOLOGY: Dg Chest Port 1 View  01/17/2014   CLINICAL DATA:  Short of breath.  Atrial fibrillation.  EXAM: PORTABLE CHEST - 1 VIEW  COMPARISON:  06/02/2013.  FINDINGS: There is chronic pleural parenchymal opacity in the left mid and lower lung zone consistent with chronic scarring. Chronic apical pleural thickening. There is associated volume loss. The right lung is hyperexpanded but clear. Cardiac silhouette is normal in size. No  mediastinal or hilar masses.  Right anterior chest wall sequential pacemaker is stable with both leads projecting in the right ventricle.  Bony thorax is intact.  IMPRESSION: No acute cardiopulmonary disease. Stable chronic findings in the left lung.   Electronically Signed   By: Lajean Manes M.D.   On: 01/17/2014 21:08   US Abdomen Limited Ruq  01/19/2014   CLINICAL DATA:  Elevated liver function tests.  EXAM: US ABDOMEN LIMITED - RIGHT UPPER QUADRANT  COMPARISON:  Ultrasound of June 17, 2013.  FINDINGS: Gallbladder:  No gallstones or wall thickening visualized. No sonographic Murphy sign noted. Mild amount of sludge is present within the gallbladder lumen.  Common bile duct:  Diameter: 4.8 mm.  Liver:  Heterogeneous echotexture of hepatic parenchyma is noted suggesting diffuse hepatocellular disease or possibly fatty  infiltration. No focal abnormality is noted.  IMPRESSION: Gallbladder sludge is noted, but no gallstones or evidence of cholecystitis is noted.  Heterogeneous echotexture of hepatic parenchyma is noted suggesting diffuse hepatocellular disease or possibly fatty infiltration.   Electronically Signed   By: Sabino Dick M.D.   On: 01/19/2014 08:30    ASSESSMENT AND PLAN:  Principal Problem:   Atrial fibrillation with RVR Active Problems:   BRADYCARDIA-TACHYCARDIA SYNDROME   COPD (chronic obstructive pulmonary disease)   Long-term (current) use of anticoagulants   Chronic kidney disease   Non-ischemic cardiomyopathy   Severe malnutrition   PVD (peripheral vascular disease)   1. Tachy/brady  Continue current therapy. Could take off telemetry.   He is not a candidate for invasive EP procedures   2. Long standing Persistent afib  Continue rate control as our strategy at this time  Continue life long anticoagulation   3. Chronic systolic dysfunction y  Not volume overloaded  Continue current rate control   No new EP recs Could go home from my standpoint. Follow-up with me in 4 weeks Electrophysiology team to see as needed while here. Please call with questions.

## 2014-01-20 NOTE — Discharge Instructions (Addendum)
Thank you for allowing [place] to be involved in your healthcare while you were hospitalized at [facility].   Please note that there have been changes to your home medications.  --> PLEASE LOOK AT YOUR DISCHARGE MEDICATION LIST FOR DETAILS.   Please call your PCP if you have any questions or concerns, or any difficulty getting any of your medications.  Please return to the ER if you have worsening of your symptoms or new severe symptoms arise.  Make sure you complete all of your antibiotics, you have two more doses to take.  Avoid foods high in potassium.  Please see the list below for foods that you should avoid.  Reduce your Coumadin dose to 5 mg every day.  Potassium Content of Foods Potassium is a mineral found in many foods and drinks. It helps keep fluids and minerals balanced in your body and affects how steadily your heart beats. Potassium also helps control your blood pressure and keep your muscles and nervous system healthy. Certain health conditions and medicines may change the balance of potassium in your body. When this happens, you can help balance your level of potassium through the foods that you do or do not eat. Your health care provider or dietitian may recommend an amount of potassium that you should have each day. The following lists of foods provide the amount of potassium (in parentheses) per serving in each item. HIGH IN POTASSIUM  The following foods and beverages have 200 mg or more of potassium per serving:  Apricots, 2 raw or 5 dry (200 mg).  Artichoke, 1 medium (345 mg).  Avocado, raw,  each (245 mg).  Banana, 1 medium (425 mg).  Beans, lima, or baked beans, canned,  cup (280 mg).  Beans, white, canned,  cup (595 mg).  Beef roast, 3 oz (320 mg).  Beef, ground, 3 oz (270 mg).  Beets, raw or cooked,  cup (260 mg).  Bran muffin, 2 oz (300 mg).  Broccoli,  cup (230 mg).  Brussels sprouts,  cup (250 mg).  Cantaloupe,  cup  (215 mg).  Cereal, 100% bran,  cup (200-400 mg).  Cheeseburger, single, fast food, 1 each (225-400 mg).  Chicken, 3 oz (220 mg).  Clams, canned, 3 oz (535 mg).  Crab, 3 oz (225 mg).  Dates, 5 each (270 mg).  Dried beans and peas,  cup (300-475 mg).  Figs, dried, 2 each (260 mg).  Fish: halibut, tuna, cod, snapper, 3 oz (480 mg).  Fish: salmon, haddock, swordfish, perch, 3 oz (300 mg).  Fish, tuna, canned 3 oz (200 mg).  [place] fries, fast food, 3 oz (470 mg).  Granola with fruit and nuts,  cup (200 mg).  Grapefruit juice,  cup (200 mg).  Greens, beet,  cup (655 mg).  Honeydew melon,  cup (200 mg).  Kale, raw, 1 cup (300 mg).  Kiwi, 1 medium (240 mg).  Kohlrabi, rutabaga, parsnips,  cup (280 mg).  Lentils,  cup (365 mg).  Mango, 1 each (325 mg).  Milk, chocolate, 1 cup (420 mg).  Milk: nonfat, low-fat, whole, buttermilk, 1 cup (350-380 mg).  Molasses, 1 Tbsp (295 mg).  Mushrooms,  cup (280) mg.  Nectarine, 1 each (275 mg).  Nuts: almonds, peanuts, hazelnuts, [place], cashew, mixed, 1 oz (200 mg).  Nuts, pistachios, 1 oz (295 mg).  Orange, 1 each (240 mg).  Orange juice,  cup (235 mg).  Papaya, medium,  fruit (390 mg).  Peanut butter, chunky, 2 Tbsp (240  mg).  Peanut butter, smooth, 2 Tbsp (210 mg).  Pear, 1 medium (200 mg).  Pomegranate, 1 whole (400 mg).  Pomegranate juice,  cup (215 mg).  Pork, 3 oz (350 mg).  Potato chips, salted, 1 oz (465 mg).  Potato, baked with skin, 1 medium (925 mg).  Potatoes, boiled,  cup (255 mg).  Potatoes, mashed,  cup (330 mg).  Prune juice,  cup (370 mg).  Prunes, 5 each (305 mg).  Pudding, chocolate,  cup (230 mg).  Pumpkin, canned,  cup (250 mg).  Raisins, seedless,  cup (270 mg).  Seeds, sunflower or pumpkin, 1 oz (240 mg).  Soy milk, 1 cup (300 mg).  Spinach,  cup (420 mg).  Spinach, canned,  cup (370 mg).  Sweet potato, baked with skin, 1 medium (450  mg).  Swiss chard,  cup (480 mg).  Tomato or vegetable juice,  cup (275 mg).  Tomato sauce or puree,  cup (400-550 mg).  Tomato, raw, 1 medium (290 mg).  Tomatoes, canned,  cup (200-300 mg).  [place], 3 oz (250 mg).  Wheat germ, 1 oz (250 mg).  Winter squash,  cup (250 mg).  Yogurt, plain or fruited, 6 oz (260-435 mg).  Zucchini,  cup (220 mg). MODERATE IN POTASSIUM The following foods and beverages have 50-200 mg of potassium per serving:  Apple, 1 each (150 mg).  Apple juice,  cup (150 mg).  Applesauce,  cup (90 mg).  Apricot nectar,  cup (140 mg).  Asparagus, small spears,  cup or 6 spears (155 mg).  Bagel, cinnamon raisin, 1 each (130 mg).  Bagel, egg or plain, 4 in., 1 each (70 mg).  Beans, green,  cup (90 mg).  Beans, yellow,  cup (190 mg).  Beer, regular, 12 oz (100 mg).  Beets, canned,  cup (125 mg).  Blackberries,  cup (115 mg).  Blueberries,  cup (60 mg).  Bread, whole wheat, 1 slice (70 mg).  Broccoli, raw,  cup (145 mg).  Cabbage,  cup (150 mg).  Carrots, cooked or raw,  cup (180 mg).  Cauliflower, raw,  cup (150 mg).  Celery, raw,  cup (155 mg).  Cereal, bran flakes, cup (120-150 mg).  Cheese, cottage,  cup (110 mg).  Cherries, 10 each (150 mg).  Chocolate, 1 oz bar (165 mg).  Coffee, brewed 6 oz (90 mg).  Corn,  cup or 1 ear (195 mg).  Cucumbers,  cup (80 mg).  Egg, large, 1 each (60 mg).  Eggplant,  cup (60 mg).  Endive, raw, cup (80 mg).  English muffin, 1 each (65 mg).  Fish, orange roughy, 3 oz (150 mg).  Frankfurter, beef or pork, 1 each (75 mg).  Fruit cocktail,  cup (115 mg).  Grape juice,  cup (170 mg).  Grapefruit,  fruit (175 mg).  Grapes,  cup (155 mg).  Greens: kale, turnip, collard,  cup (110-150 mg).  Ice cream or frozen yogurt, chocolate,  cup (175 mg).  Ice cream or frozen yogurt, vanilla,  cup (120-150 mg).  Lemons, limes, 1 each (80 mg).  Lettuce,  all types, 1 cup (100 mg).  Mixed vegetables,  cup (150 mg).  Mushrooms, raw,  cup (110 mg).  Nuts: walnuts, pecans, or macadamia, 1 oz (125 mg).  Oatmeal,  cup (80 mg).  Okra,  cup (110 mg).  Onions, raw,  cup (120 mg).  Peach, 1 each (185 mg).  Peaches, canned,  cup (120 mg).  Pears, canned,  cup (120 mg).  Peas, green, frozen,  cup (90 mg).  Peppers, green,  cup (130 mg).  Peppers, red,  cup (160 mg).  Pineapple juice,  cup (165 mg).  Pineapple, fresh or canned,  cup (100 mg).  Plums, 1 each (105 mg).  Pudding, vanilla,  cup (150 mg).  Raspberries,  cup (90 mg).  Rhubarb,  cup (115 mg).  Rice, wild,  cup (80 mg).  Shrimp, 3 oz (155 mg).  Spinach, raw, 1 cup (170 mg).  Strawberries,  cup (125 mg).  Summer squash  cup (175-200 mg).  Swiss chard, raw, 1 cup (135 mg).  Tangerines, 1 each (140 mg).  Tea, brewed, 6 oz (65 mg).  Turnips,  cup (140 mg).  Watermelon,  cup (85 mg).  Wine, red, table, 5 oz (180 mg).  Wine, white, table, 5 oz (100 mg). LOW IN POTASSIUM The following foods and beverages have less than 50 mg of potassium per serving.  Bread, white, 1 slice (30 mg).  Carbonated beverages, 12 oz (less than 5 mg).  Cheese, 1 oz (20-30 mg).  Cranberries,  cup (45 mg).  Cranberry juice cocktail,  cup (20 mg).  Fats and oils, 1 Tbsp (less than 5 mg).  Hummus, 1 Tbsp (32 mg).  Nectar: papaya, mango, or pear,  cup (35 mg).  Rice, white or brown,  cup (50 mg).  Spaghetti or macaroni,  cup cooked (30 mg).  Tortilla, flour or corn, 1 each (50 mg).  Waffle, 4 in., 1 each (50 mg).  Water chestnuts,  cup (40 mg). Document Released: 12/18/2004 Document Revised: 05/11/2013 Document Reviewed: 04/02/2013 [facility] Patient Information 2015 [place], [place]. This information is not intended to replace advice given to you by your health care provider. Make sure you discuss any questions you have with your health care  provider.

## 2014-01-20 NOTE — Progress Notes (Signed)
  PROGRESS NOTE MEDICINE TEACHING ATTENDING   Day 3 of stay Patient name: [name]   Medical record number: [id number] Date of birth: 09/18/1945    I met with Mr Robinson and examined him. He has not new complaints. I have discussed the care of this patient with my IM team residents - Dr Trudee Kuster. Please see the resident note - Dr Vivia Budge daily progress note - for details.  [place], [place] 01/20/2014, 1:02 PM.

## 2014-01-20 NOTE — Clinical Documentation Improvement (Addendum)
Severe malnutrition documented in H&P and subsequent progress notes for this admission.  Per RD, minor muscle wasting noted in temple area only with no applicable nutrition diagnosis; BMI 20.75. Severe malnutrition was documented in Dec 2014 admission.   Please clarify if the diagnosis of malnutrition is still applicable for this patient and document in your progress note and d/c summary.    Thank you, Mateo Flow, RN [phone] Clinical Documentation Specialist

## 2014-01-20 NOTE — Progress Notes (Signed)
Patient was previously active with [facility] for chronic disease management services.  Patient will be engaged by a [employer].  Patient has requested assistance with getting grab bars at his home and needs a safety assessment due to recently progressed weakness.  Patient will receive a post discharge transition of care call and will be evaluated for monthly home visits for assessments and disease process education.  Made Inpatient Case Manager aware that [facility] following. Of note, [facility] Care Management services does not replace or interfere with any services that are arranged by inpatient case management or social work.  For additional questions or referrals please contact Corliss Blacker BSN RN [facility] Liaison at [phone].

## 2014-01-20 NOTE — Progress Notes (Signed)
[place] for warfarin Indication: atrial fibrillation and arterial embolism/thrombus of RLE  Allergies  Allergen Reactions  . Digoxin And Related Other (See Comments)    Dig toxicity 2009  . Penicillins Rash    Patient Measurements: Height: 6\' 3"  (190.5 cm) Weight: 165 lb 12.6 oz (75.2 kg) IBW/kg (Calculated) : 84.5  Vital Signs: Temp: 98.5 F (36.9 C) (09/03 0631) BP: 94/75 mmHg (09/03 0631) Pulse Rate: 123 (09/03 0631)  Labs:  Recent Labs  01/17/14 1642 01/17/14 2035  01/18/14 0128 01/18/14 0725 01/19/14 0241 01/20/14 0700  HGB 12.9*  --   --  12.0*  --   --   --   HCT 39.9  --   --  37.2*  --   --   --   PLT 415*  --   --  397  --   --   --   APTT  --   --   --   --   --  74*  --   LABPROT 52.0*  --   --  54.7*  --  44.7* 24.6*  INR 5.78*  --   --  6.17*  --  4.76* 2.22*  CREATININE 3.00*  --   < > 2.67*  --  1.86* 1.61*  TROPONINI  --  <0.30  --  <0.30 <0.30  --   --   < > = values in this interval not displayed.  Estimated Creatinine Clearance: 46.1 ml/min (by C-G formula based on Cr of 1.61).   Medications:  Scheduled:  . buPROPion  75 mg Oral BID  . diltiazem  240 mg Oral Daily  . feeding supplement (RESOURCE BREEZE)  1 Container Oral TID BM  . lactose free nutrition  237 mL Oral TID WC  . levofloxacin  500 mg Oral Q48H  . metoprolol tartrate  50 mg Oral BID  . pantoprazole  40 mg Oral Daily  . polyethylene glycol  17 g Oral Daily  . sodium bicarbonate  1,300 mg Oral BID  . sodium chloride  3 mL Intravenous Q12H  . sodium polystyrene  15 g Oral Once  . tamsulosin  0.4 mg Oral QPC supper  . tiotropium  18 mcg Inhalation Daily  . warfarin  5 mg Oral q1800    Assessment: 68 yo m on warfarin for afib and h/o DVT presenting on 8/31 from the clinic with supratherapeutic INR of 5.78.  Recently discharged from hospital s/p hip replacement, R femoral embolectomy, intra-operative arteriogram and medical/lateral  extremity fasciotomies. INR today continues to trend down and is now within therapeutic range at 2.2, will likely continue to trend down given held doses for several days. Patient also noted to continue on levaquin for a 7d course which could potentiate INR again. He will need to be follow in the coumadin clinic closely while on abx post discharge.   Home warfarin dose was 7.5mg  daily except 10mg  on Monday and Friday  Goal of Therapy:  INR 2-3 Monitor platelets by anticoagulation protocol: Yes   Plan:  Warfarin 7.5mg  tonight Would recommend 7.5mg  daily with follow up early next week in coumadin clinic Daily INR while here Monitor CBC, s/s of bleeding  Erin Hearing PharmD., BCPS Clinical Pharmacist Pager [phone] 01/20/2014 11:32 AM

## 2014-01-20 NOTE — Progress Notes (Signed)
ANTIBIOTIC CONSULT NOTE - FOLLOW UP  Pharmacy Consult for levofloxacin Indication: UTI  Allergies  Allergen Reactions  . Digoxin And Related Other (See Comments)    Dig toxicity 2009  . Penicillins Rash    Patient Measurements: Height: 6\' 3"  (190.5 cm) Weight: 165 lb 12.6 oz (75.2 kg) IBW/kg (Calculated) : 84.5  Vital Signs: Temp: 98.5 F (36.9 C) (09/03 0631) BP: 94/75 mmHg (09/03 0631) Pulse Rate: 123 (09/03 0631) Intake/Output from previous day: 09/02 0701 - 09/03 0700 In: 780 [P.O.:480; I.V.:300] Out: 1875 [Urine:1875] Intake/Output from this shift: Total I/O In: -  Out: 600 [Urine:600]  Labs:  Recent Labs  01/17/14 1642  01/18/14 0128 01/19/14 0241 01/20/14 0700  WBC 8.4  --  9.0  --   --   HGB 12.9*  --  12.0*  --   --   PLT 415*  --  397  --   --   CREATININE 3.00*  < > 2.67* 1.86* 1.61*  < > = values in this interval not displayed. Estimated Creatinine Clearance: 46.1 ml/min (by C-G formula based on Cr of 1.61). No results found for this basename: VANCOTROUGH, Corlis Leak, VANCORANDOM, [place], GENTPEAK, GENTRANDOM, TOBRATROUGH, TOBRAPEAK, TOBRARND, AMIKACINPEAK, AMIKACINTROU, AMIKACIN,  in the last 72 hours   Microbiology: Recent Results (from the past 720 hour(s))  MRSA PCR SCREENING     Status: None   Collection Time    01/17/14  6:26 PM      Result Value Ref Range Status   MRSA by PCR NEGATIVE  NEGATIVE Final   Comment:            The GeneXpert MRSA Assay (FDA     approved for NASAL specimens     only), is one component of a     comprehensive MRSA colonization     surveillance program. It is not     intended to diagnose MRSA     infection nor to guide or     monitor treatment for     MRSA infections.  URINE CULTURE     Status: None   Collection Time    01/17/14 10:08 PM      Result Value Ref Range Status   Specimen Description URINE, CLEAN CATCH   Final   Special Requests NONE   Final   Culture  Setup Time     Final   Value:  01/17/2014 23:01     Performed at [place]     Final   Value: >=100,000 COLONIES/ML     Performed at [facility]   Culture     Final   Value: ESCHERICHIA COLI     Performed at [facility]   Report Status 01/20/2014 FINAL   Final   Organism ID, Bacteria ESCHERICHIA COLI   Final    Anti-infectives   Start     Dose/Rate Route Frequency Ordered Stop   01/22/14 0800  levofloxacin (LEVAQUIN) tablet 750 mg     750 mg Oral Every 48 hours 01/20/14 1142     01/20/14 0800  levofloxacin (LEVAQUIN) tablet 500 mg  Status:  Discontinued     500 mg Oral Every 48 hours 01/18/14 0738 01/20/14 1142   01/18/14 0745  levofloxacin (LEVAQUIN) tablet 750 mg     750 mg Oral  Once 01/18/14 0738 01/18/14 1005   01/18/14 0100  sulfamethoxazole-trimethoprim (BACTRIM DS) 800-160 MG per tablet 1 tablet  Status:  Discontinued     1 tablet  Oral Every 12 hours 01/18/14 0039 01/18/14 0718      Assessment: 12 yom continuing on levofloxacin for D#3 for UTI. Acute on CKD (stage 3). Renal function improving, SCr trending down 1.86>>1.61 (baseline~1.6), CrCl 46.1. Afeb, wbc wnl.  9/1 levaquin 750mg  q48h>>  8/31 UC>>E.coli (S-levo)  Goal of Therapy:  Eradication of infection  Plan:  Increase levofloxacin to 750mg  q48h with improving renal funct F/u clinical progress, abx dot, renal funct

## 2014-01-20 NOTE — Care Management Note (Addendum)
    Page 1 of 1   01/20/2014     4:32:31 PM CARE MANAGEMENT NOTE 01/20/2014  Patient:  [name], [name]   Account Number:  [id number]  Date Initiated:  01/18/2014  Documentation initiated by:  Elissa Hefty  Subjective/Objective Assessment:   adm w at fib     Action/Plan:   lives w friend, pcp dr Bertha Stakes, act w [facility] pta   Anticipated DC Date:     Anticipated DC Plan:  [place]  CM consult      [facility] Choice  Resumption Of Svcs/PTA Provider   Choice offered to / List presented to:  C-1 Patient        [place] arranged  HH-1 RN  [place] agency  [place].   Status of service:  Completed, signed off Medicare Important Message given?  YES (If response is "NO", the following Medicare IM given date fields will be blank) Date Medicare IM given:  01/20/2014 Medicare IM given by:  GRAVES-BIGELOW,Asher Torpey Date Additional Medicare IM given:   Additional Medicare IM given by:    Discharge Disposition:  [place]  Per UR Regulation:  Reviewed for med. necessity/level of care/duration of stay  If discussed at [place] of Stay Meetings, dates discussed:    Comments:  01-19-14 [address] Jacqlyn Krauss, RN,BSN [phone] CM did make additional referral with [facility] for services. CM will continue to monitor.

## 2014-01-20 NOTE — Progress Notes (Signed)
INTERNAL MEDICINE TEACHING ATTENDING ADDENDUM - Aldine Contes M.D  Duration- indefinite, Indication- afib, INR- supratherapeutic. Agree with Dr. Gladstone Pih recommendations as outlined in his note.

## 2014-01-20 NOTE — Progress Notes (Signed)
Subjective:    [name] continues to feel better today. His light-headedness has improved.  He reports improvement of his urinary symptoms, and he is finding it easier to urinate with better voiding.  He says he hasn't had a bowel movement in the last two weeks.  He denies palpitations, chest pain, or shortness of breath.  Interval Events: -HR stable at 110-120s this morning, afebrile. -RUQ U/S showed diffuse hepatocellular disease or possible fatty infiltration. -Started on Flomax yesterday. -Cr back to baseline this morning at 1.61. -INR down to 2.22 this morning.   Objective:    Vital Signs:   Temp:  [97.7 F (36.5 C)-98.7 F (37.1 C)] 98.5 F (36.9 C) (09/03 0631) Pulse Rate:  [52-126] 123 (09/03 0631) Resp:  [14-24] 18 (09/03 0631) BP: (94-108)/(72-83) 94/75 mmHg (09/03 0631) SpO2:  [97 %-100 %] 97 % (09/03 1006) Weight:  [165 lb 12.6 oz (75.2 kg)] 165 lb 12.6 oz (75.2 kg) (09/03 0631) Last BM Date: 01/10/14  24-hour weight change: Weight change:   Intake/Output:   Intake/Output Summary (Last 24 hours) at 01/20/14 1121 Last data filed at 01/20/14 1011  Gross per 24 hour  Intake    220 ml  Output   2150 ml  Net  -1930 ml      Physical Exam: General: Vital signs reviewed and noted. Well-developed, well-nourished, in no acute distress; alert, appropriate and cooperative throughout examination.  Lungs:  Normal respiratory effort. Clear to auscultation BL without crackles or wheezes.  Heart: Tachycardic, irregular, without gallop, murmur, or rubs.  Abdomen:  BS normoactive. Soft, Nondistended, nontender.  No masses or organomegaly.  Extremities: RLE surgical site, clean, dry, intact. No pretibial edema, good distal pulses.     Labs:  Basic Metabolic Panel:  Recent Labs Lab 01/17/14 1642 01/17/14 2100 01/18/14 0128 01/19/14 0241 01/20/14 0700  NA 139 139 142 143 142  K 5.6* 6.2* 4.9 5.2 5.4*  CL 101 100 102 110 108  CO2 22 17* _0 GLUCOSE 93  105* 74 111* 88  BUN 66* 64* 62* 40* 28*  CREATININE 3.00* 2.66* 2.67* 1.86* 1.61*  CALCIUM 9.9 9.8 9.0 8.3* 8.7    Liver Function Tests:  Recent Labs Lab 01/17/14 1642 01/17/14 2100 01/18/14 0128 01/19/14 0241  AST 112* 109* 67* 24  ALT 59* 61* 49 26  ALKPHOS 242* 246* 213* 145*  BILITOT 0.4 0.4 0.3 0.2*  PROT 8.2 8.5* 7.3 6.1  ALBUMIN 3.2* 3.1* 2.9* 2.3*   CBC:  Recent Labs Lab 01/17/14 1642 01/18/14 0128  WBC 8.4 9.0  NEUTROABS 5.5 5.4  HGB 12.9* 12.0*  HCT 39.9 37.2*  MCV 87.7 87.9  PLT 415* 397    Cardiac Enzymes:  Recent Labs Lab 01/17/14 2035 01/18/14 0128 01/18/14 0725  TROPONINI <0.30 <0.30 <0.30   Microbiology: Results for orders placed during the hospital encounter of 01/17/14  MRSA PCR SCREENING     Status: None   Collection Time    01/17/14  6:26 PM      Result Value Ref Range Status   MRSA by PCR NEGATIVE  NEGATIVE Final   Comment:            The GeneXpert MRSA Assay (FDA     approved for NASAL specimens     only), is one component of a     comprehensive MRSA colonization     surveillance program. It is not     intended to diagnose MRSA  infection nor to guide or     monitor treatment for     MRSA infections.  URINE CULTURE     Status: None   Collection Time    01/17/14 10:08 PM      Result Value Ref Range Status   Specimen Description URINE, CLEAN CATCH   Final   Special Requests NONE   Final   Culture  Setup Time     Final   Value: 01/17/2014 23:01     Performed at [place]     Final   Value: >=100,000 COLONIES/ML     Performed at [facility]   Culture     Final   Value: ESCHERICHIA COLI     Performed at [facility]   Report Status 01/20/2014 FINAL   Final   Organism ID, Bacteria ESCHERICHIA COLI   Final    Coagulation Studies:  Recent Labs  01/17/14 1642 01/18/14 0128 01/19/14 0241 01/20/14 0700  LABPROT 52.0* 54.7* 44.7* 24.6*  INR 5.78* 6.17* 4.76* 2.22*     Imaging: US Abdomen Limited Ruq  01/19/2014   CLINICAL DATA:  Elevated liver function tests.  EXAM: US ABDOMEN LIMITED - RIGHT UPPER QUADRANT  COMPARISON:  Ultrasound of June 17, 2013.  FINDINGS: Gallbladder:  No gallstones or wall thickening visualized. No sonographic Murphy sign noted. Mild amount of sludge is present within the gallbladder lumen.  Common bile duct:  Diameter: 4.8 mm.  Liver:  Heterogeneous echotexture of hepatic parenchyma is noted suggesting diffuse hepatocellular disease or possibly fatty infiltration. No focal abnormality is noted.  IMPRESSION: Gallbladder sludge is noted, but no gallstones or evidence of cholecystitis is noted.  Heterogeneous echotexture of hepatic parenchyma is noted suggesting diffuse hepatocellular disease or possibly fatty infiltration.   Electronically Signed   By: Sabino Dick M.D.   On: 01/19/2014 08:30       Medications:    Infusions:    Scheduled Medications: . buPROPion  75 mg Oral BID  . diltiazem  240 mg Oral Daily  . feeding supplement (RESOURCE BREEZE)  1 Container Oral TID BM  . lactose free nutrition  237 mL Oral TID WC  . levofloxacin  500 mg Oral Q48H  . metoprolol tartrate  50 mg Oral BID  . pantoprazole  40 mg Oral Daily  . polyethylene glycol  17 g Oral Daily  . sodium bicarbonate  1,300 mg Oral BID  . sodium chloride  3 mL Intravenous Q12H  . sodium polystyrene  15 g Oral Once  . tamsulosin  0.4 mg Oral QPC supper  . tiotropium  18 mcg Inhalation Daily    PRN Medications: albuterol, oxyCODONE, oxyCODONE-acetaminophen   Assessment/ Plan:    Principal Problem:   Atrial fibrillation with RVR Active Problems:   BRADYCARDIA-TACHYCARDIA SYNDROME   COPD (chronic obstructive pulmonary disease)   Long-term (current) use of anticoagulants   Chronic kidney disease   Non-ischemic cardiomyopathy   Severe malnutrition   PVD (peripheral vascular disease)  #Atrial fibrillation/flutter with RVR Cardiology is okay with  current rate control and wants him to follow up as an outpatient, okay for discharge per them.  He has tachy-brady syndrome so rate control is difficult in him.  Light-headedness likely due to fast rate, not orthostatic.  INR 2.22 this morning.  Dr. Elie Confer recommends 5 mg daily, and he will follow up as outpatient on Monday. -Appreciate cardiology recs. -Continue diltiazem ER 240 mg daily today. -Continue metoprolol 50 mg  BID. -Regular diet per patient request. -Stop IVF. -Resource Breeze TID per nutrition recs. -Restart Coumadin at 5 mg daily per Dr. Elie Confer, needs lower dose due to levofloxacin. -Appointments arranged with cardiology, PCP, and Coumadin clinic.  #Uncomplicated UTI Urinary symptoms improved. Grew E. Coli pansensitive. GFR <50, so will get levofloxacin every 48 hours for renal dosing. -Levofloxacin 500 mg q48h for 7 days (ending 01/24/14).  #BPH Long-term retention and weak flow symptoms, likely BPH. Flomax appears to be helping. -Flomax 0.4 mg daily.  #Acute on chronic renal insufficiency (CKD stage III) Creatinine at baseline. -Monitor Cr. -Stop IVF.  #Renal tubular acidosis Type IV K of 5.4 today up from 5.2, on home bicarb.  Kayexalate helped on admission.  Will administer today.  Reports no bowel movements in 2 weeks, so kayexalate may help with that as well. -Kayexalate 15 g, no BM, so 15 additional grams. -Continue home bicarbonate 1300 mg PO BID.  #Elevated alk phos RUQ ultrasound shows diffuse hepatocellular disease, likely related to prior alcohol use.  Was improving yesterday. -No further work-up.  #Constipation On Miralax at home.  No BMs in 2 weeks reportedly. -Continue home Miralax daily, assess for bowel movements. -Kayexalate as above.  #Chronic pain Back and leg pain stable. -Continue home Percocet 10-325 q4h PRN.  #COPD No shortness of breath. -Continue albuterol and tiotropium  #Depression Mood stable. -Continue home bupropion.  #GERD No  indigestion or burning. -Continue Protonix.   DVT PPX - Coumadin with INR goal 2-3  CODE STATUS - Full code.  CONSULTS PLACED - Cardiology.  DISPO - Probable discharge today after bowel movement.  The patient does have a current PCP (Axel Filler, MD) and does need an [facility] hospital follow-up appointment after discharge.    Is the [facility] hospital follow-up appointment a one-time only appointment? no.  Does the patient have transportation limitations that hinder transportation to clinic appointments? yes   SERVICE NEEDED AT [place]         Y = Yes, Blank = No PT:   OT:   RN:   Equipment:   Other:      Length of Stay: 3 day(s)   Signed: Arman Filter, MD  PGY-1, Internal Medicine Resident Pager: [phone] (7AM-5PM) 01/20/2014, 11:21 AM

## 2014-01-21 LAB — BASIC METABOLIC PANEL
ANION GAP: 12 (ref 5–15)
BUN: 23 mg/dL (ref 6–23)
CO2: 23 mEq/L (ref 19–32)
CREATININE: 1.59 mg/dL — AB (ref 0.50–1.35)
Calcium: 8.8 mg/dL (ref 8.4–10.5)
Chloride: 108 mEq/L (ref 96–112)
GFR calc non Af Amer: 43 mL/min — ABNORMAL LOW (ref >90)
GFR, EST AFRICAN AMERICAN: 49 mL/min — AB (ref >90)
Glucose, Bld: 85 mg/dL (ref 70–99)
POTASSIUM: 4.9 meq/L (ref 3.7–5.3)
Sodium: 143 mEq/L (ref 137–147)

## 2014-01-21 LAB — PROTIME-INR
INR: 1.86 — ABNORMAL HIGH (ref 0.00–1.49)
PROTHROMBIN TIME: 21.4 s — AB (ref 11.6–15.2)

## 2014-01-21 MED ORDER — SORBITOL 70 % SOLN
960.0000 mL | TOPICAL_OIL | Freq: Once | ORAL | Status: DC
  Filled 2014-01-21: qty 240

## 2014-01-21 MED ORDER — SENNA 8.6 MG PO TABS
1.0000 | ORAL_TABLET | Freq: Every day | ORAL | Status: DC | PRN
Start: 2014-01-21 — End: 2014-01-21
  Filled 2014-01-21: qty 1

## 2014-01-21 MED ORDER — MINERAL OIL RE ENEM
1.0000 | ENEMA | Freq: Once | RECTAL | Status: DC
  Filled 2014-01-21: qty 1

## 2014-01-21 MED ORDER — MINERAL OIL RE ENEM
1.0000 | ENEMA | Freq: Once | RECTAL | Status: AC
  Administered 2014-01-21: 1 via RECTAL
  Filled 2014-01-21: qty 1

## 2014-01-21 MED ORDER — SENNOSIDES-DOCUSATE SODIUM 8.6-50 MG PO TABS: 1.0000 | ORAL_TABLET | Freq: Every day | ORAL | Status: DC

## 2014-01-21 MED ORDER — SORBITOL 70 % SOLN
960.0000 mL | TOPICAL_OIL | Freq: Once | ORAL | Status: AC
  Administered 2014-01-21: 960 mL via RECTAL
  Filled 2014-01-21: qty 240

## 2014-01-21 NOTE — Progress Notes (Signed)
  PROGRESS NOTE MEDICINE TEACHING ATTENDING   Day 4 of stay Patient name: [name]   Medical record number: [id number] Date of birth: 1946-04-19     Met with patient, doing better. Convinced him to try enema for constipation. He complies. Discharge pending patient has a BM. Hyperkalemia resolved.  I have discussed the care of this patient with my IM team residents- Drs Sharlette Dense and [place]. Please see the resident note for details.  [place], [place] 01/21/2014, 11:07 AM.

## 2014-01-21 NOTE — Progress Notes (Signed)
[place] for warfarin Indication: atrial fibrillation and arterial embolism/thrombus of RLE  Allergies  Allergen Reactions  . Digoxin And Related Other (See Comments)    Dig toxicity 2009  . Penicillins Rash    Patient Measurements: Height: 6\' 3"  (190.5 cm) Weight: 166 lb 6.4 oz (75.479 kg) IBW/kg (Calculated) : 84.5  Vital Signs: Temp: 98.2 F (36.8 C) (09/04 0543) Temp src: Oral (09/04 0543) BP: 110/83 mmHg (09/04 0543) Pulse Rate: 130 (09/04 0543)  Labs:  Recent Labs  01/19/14 0241 01/20/14 0700 01/20/14 1521 01/21/14 0427  APTT 74*  --   --   --   LABPROT 44.7* 24.6*  --  21.4*  INR 4.76* 2.22*  --  1.86*  CREATININE 1.86* 1.61* 1.57* 1.59*    Estimated Creatinine Clearance: 46.8 ml/min (by C-G formula based on Cr of 1.59).   Medications:  Scheduled:  . buPROPion  75 mg Oral BID  . diltiazem  240 mg Oral Daily  . feeding supplement (RESOURCE BREEZE)  1 Container Oral TID BM  . lactose free nutrition  237 mL Oral TID WC  . [START ON 01/22/2014] levofloxacin  750 mg Oral Q48H  . metoprolol tartrate  50 mg Oral BID  . pantoprazole  40 mg Oral Daily  . polyethylene glycol  17 g Oral Daily  . sodium bicarbonate  1,300 mg Oral BID  . sodium chloride  3 mL Intravenous Q12H  . tamsulosin  0.4 mg Oral QPC supper  . tiotropium  18 mcg Inhalation Daily  . warfarin  5 mg Oral q1800  . Warfarin - Pharmacist Dosing Inpatient   Does not apply q1800    Assessment: 68 yo m on warfarin for afib and h/o DVT presenting on 8/31 from the clinic with supratherapeutic INR of 5.78.  Recently discharged from hospital s/p hip replacement, R femoral embolectomy, intra-operative arteriogram and medical/lateral extremity fasciotomies. INR today continues to trend down and is now below therapeutic range at 1.8.  Patient also noted to continue on levaquin for a 7d course which could potentiate INR as well. He will need to be follow in the coumadin  clinic closely while on abx post discharge.   Home warfarin dose was 7.5mg  daily except 10mg  on Monday and Friday  Goal of Therapy:  INR 2-3 Monitor platelets by anticoagulation protocol: Yes   Plan:  Warfarin 5mg  daily ordered by IMTS Patient to be follow by Dr.Groce in coumadin clinic Daily INR while here Monitor CBC, s/s of bleeding  Erin Hearing PharmD., BCPS Clinical Pharmacist Pager [phone] 01/21/2014 9:43 AM

## 2014-01-21 NOTE — Progress Notes (Addendum)
 Subjective:    [name] continues to feel better today.  His urination is significantly better since starting Flomax.  He is ready to go home today.  Still no bowel movements.  He denies light-headedness, chest pain, or shortness of breath.  Interval Events: -HR stable, afebrile. -K elevated at 5.4 yesterday, given kayexalate 15 g x2 without bowel movement or improvement on BMP yesterday. -K improved to 4.9 this morning. -Cr continues to be at baseline 1.59. -Restarted on Coumadin 5 mg yesterday, INR 1.86.   Objective:    Vital Signs:   Temp:  [97.9 F (36.6 C)-98.7 F (37.1 C)] 98.2 F (36.8 C) (09/04 0543) Pulse Rate:  [119-130] 130 (09/04 0543) Resp:  [16-20] 20 (09/04 0543) BP: (99-110)/(76-83) 110/83 mmHg (09/04 0543) SpO2:  [97 %-100 %] 100 % (09/04 0543) Weight:  [166 lb 6.4 oz (75.479 kg)] 166 lb 6.4 oz (75.479 kg) (09/04 0543) Last BM Date: 01/10/14  24-hour weight change: Weight change: 9.8 oz (0.279 kg)  Intake/Output:   Intake/Output Summary (Last 24 hours) at 01/21/14 0816 Last data filed at 01/21/14 0544  Gross per 24 hour  Intake      0 ml  Output    700 ml  Net   -700 ml      Physical Exam: General: Vital signs reviewed and noted. Well-developed, cachectic, in no acute distress; alert, appropriate and cooperative throughout examination.  Lungs:  Normal respiratory effort. Clear to auscultation BL without crackles or wheezes.  Heart: Tachycardic, irregular, without gallop, murmur, or rubs.  Abdomen:  BS normoactive. Soft, Nondistended, nontender.  No masses or organomegaly.  Extremities: RLE surgical site, clean, dry, intact. No pretibial edema, good distal pulses.     Labs:  Basic Metabolic Panel:  Recent Labs Lab 01/18/14 0128 01/19/14 0241 01/20/14 0700 01/20/14 1521 01/21/14 0427  NA 142 143 142 142 143  K 4.9 5.2 5.4* 5.5* 4.9  CL 102 110 108 109 108  CO2 24 20 21 21 23  GLUCOSE 74 111* 88 103* 85  BUN 62* 40* 28* 24* 23    CREATININE 2.67* 1.86* 1.61* 1.57* 1.59*  CALCIUM 9.0 8.3* 8.7 8.4 8.8    Liver Function Tests:  Recent Labs Lab 01/17/14 1642 01/17/14 2100 01/18/14 0128 01/19/14 0241  AST 112* 109* 67* 24  ALT 59* 61* 49 26  ALKPHOS 242* 246* 213* 145*  BILITOT 0.4 0.4 0.3 0.2*  PROT 8.2 8.5* 7.3 6.1  ALBUMIN 3.2* 3.1* 2.9* 2.3*   CBC:  Recent Labs Lab 01/17/14 1642 01/18/14 0128  WBC 8.4 9.0  NEUTROABS 5.5 5.4  HGB 12.9* 12.0*  HCT 39.9 37.2*  MCV 87.7 87.9  PLT 415* 397    Cardiac Enzymes:  Recent Labs Lab 01/17/14 2035 01/18/14 0128 01/18/14 0725  TROPONINI <0.30 <0.30 <0.30   Microbiology: Results for orders placed during the hospital encounter of 01/17/14  MRSA PCR SCREENING     Status: None   Collection Time    01/17/14  6:26 PM      Result Value Ref Range Status   MRSA by PCR NEGATIVE  NEGATIVE Final   Comment:            The GeneXpert MRSA Assay (FDA     approved for NASAL specimens     only), is one component of a     comprehensive MRSA colonization     surveillance program. It is not     intended to diagnose MRSA       infection nor to guide or     monitor treatment for     MRSA infections.  URINE CULTURE     Status: None   Collection Time    01/17/14 10:08 PM      Result Value Ref Range Status   Specimen Description URINE, CLEAN CATCH   Final   Special Requests NONE   Final   Culture  Setup Time     Final   Value: 01/17/2014 23:01     Performed at [facility]   Colony Count     Final   Value: >=100,000 COLONIES/ML     Performed at [facility]   Culture     Final   Value: ESCHERICHIA COLI     Performed at [facility]   Report Status 01/20/2014 FINAL   Final   Organism ID, Bacteria ESCHERICHIA COLI   Final    Coagulation Studies:  Recent Labs  01/19/14 0241 01/20/14 0700 01/21/14 0427  LABPROT 44.7* 24.6* 21.4*  INR 4.76* 2.22* 1.86*    Imaging: Us Abdomen Limited Ruq  01/19/2014   CLINICAL DATA:  Elevated  liver function tests.  EXAM: US ABDOMEN LIMITED - RIGHT UPPER QUADRANT  COMPARISON:  Ultrasound of June 17, 2013.  FINDINGS: Gallbladder:  No gallstones or wall thickening visualized. No sonographic Murphy sign noted. Mild amount of sludge is present within the gallbladder lumen.  Common bile duct:  Diameter: 4.8 mm.  Liver:  Heterogeneous echotexture of hepatic parenchyma is noted suggesting diffuse hepatocellular disease or possibly fatty infiltration. No focal abnormality is noted.  IMPRESSION: Gallbladder sludge is noted, but no gallstones or evidence of cholecystitis is noted.  Heterogeneous echotexture of hepatic parenchyma is noted suggesting diffuse hepatocellular disease or possibly fatty infiltration.   Electronically Signed   By: James  Green M.D.   On: 01/19/2014 08:30       Medications:    Infusions:    Scheduled Medications: . buPROPion  75 mg Oral BID  . diltiazem  240 mg Oral Daily  . feeding supplement (RESOURCE BREEZE)  1 Container Oral TID BM  . lactose free nutrition  237 mL Oral TID WC  . [START ON 01/22/2014] levofloxacin  750 mg Oral Q48H  . metoprolol tartrate  50 mg Oral BID  . pantoprazole  40 mg Oral Daily  . polyethylene glycol  17 g Oral Daily  . sodium bicarbonate  1,300 mg Oral BID  . sodium chloride  3 mL Intravenous Q12H  . tamsulosin  0.4 mg Oral QPC supper  . tiotropium  18 mcg Inhalation Daily  . warfarin  5 mg Oral q1800  . Warfarin - Pharmacist Dosing Inpatient   Does not apply q1800    PRN Medications: albuterol, oxyCODONE, oxyCODONE-acetaminophen   Assessment/ Plan:    Principal Problem:   Atrial fibrillation with RVR Active Problems:   BRADYCARDIA-TACHYCARDIA SYNDROME   COPD (chronic obstructive pulmonary disease)   Long-term (current) use of anticoagulants   Chronic kidney disease   Non-ischemic cardiomyopathy   Severe malnutrition   PVD (peripheral vascular disease)   UTI (urinary tract infection)   BPH (benign prostatic  hyperplasia)  #Atrial fibrillation/flutter with RVR Cardiology okay with discharge. Outpatient follow up arranged.  INR down today at 1.86 after Coumadin 5 mg yesterday. -Continue diltiazem ER 240 mg daily today. -Continue metoprolol 50 mg BID. -Regular diet per patient request. -Continue Coumadin at 5 mg daily per Dr. Groce. -Touch base with pharmacy about increasing dose. -Appointments arranged   with cardiology, PCP, and Coumadin clinic. -Discharge home today.  #Uncomplicated UTI Urinary symptoms continue to improve.  Needs two more doses of levofloxacin. -Levofloxacin 750 mg q48h for 7 days (ending 01/24/14) per pharmacy recs.  #BPH Much easier urination after starting Flomax. -Flomax 0.4 mg daily.  #Acute on chronic renal insufficiency (CKD stage III) Creatinine stable. -Monitor Cr.  #Renal tubular acidosis Type IV K improved to 4.9 after kayexalate yesterday. -Continue home bicarbonate 1300 mg PO BID.  #Alcoholic hepatitis Elevate alk phos. -No further work-up.  #Constipation Take Miralax daily at home, but no bowel movement in 2 weeks.  Agreed to receive enema. -Continue home Miralax daily. -Start Sennakot on discharge. -Mineral oil enema today.  #Chronic pain Back and leg pain stable. -Continue home Percocet 10-325 q4h PRN.  #COPD No shortness of breath. -Continue albuterol and tiotropium.  #Depression Mood stable. -Continue home bupropion.  #GERD No indigestion or burning. -Continue Protonix.  #Severe malnutrition Cachectic on exam.  Nutrition consulted and recommend supplementation. -Resource Breeze TID per nutrition recs.   DVT PPX - Coumadin with INR goal 2-3  CODE STATUS - Full code.  CONSULTS PLACED - Cardiology.  DISPO - Discharge today.  The patient does have a current PCP (JOINES,JERRY DALE, MD) and does need an OPC hospital follow-up appointment after discharge.    Is the OPC hospital follow-up appointment a one-time only appointment?  no.  Does the patient have transportation limitations that hinder transportation to clinic appointments? yes   SERVICE NEEDED AT DISCHARGE - TO BE DETERMINED DURING HOSPITAL COURSE         Y = Yes, Blank = No PT:   OT:   RN:   Equipment:   Other:      Length of Stay: 4 day(s)   Signed:  , MD  PGY-1, Internal Medicine Resident Pager: [phone] (7AM-5PM) 01/21/2014, 8:16 AM      

## 2014-01-21 NOTE — Discharge Summary (Signed)
Name: [name] MRN: [id number] DOB: 09/03/1945 68 y.o. PCP: Travis Filler, MD  Date of Admission: 01/17/2014  6:20 PM Date of Discharge: 01/21/2014 Attending Physician: Travis Fireman, MD  Discharge Diagnosis: Principal Problem:   Atrial fibrillation with RVR Active Problems:   BRADYCARDIA-TACHYCARDIA SYNDROME   COPD (chronic obstructive pulmonary disease)   Long-term (current) use of anticoagulants   Chronic kidney disease   Non-ischemic cardiomyopathy   Severe malnutrition   PVD (peripheral vascular disease)   UTI (urinary tract infection)   BPH (benign prostatic hyperplasia)  Discharge Medications:   Medication List    STOP taking these medications       metoprolol succinate 50 MG 24 hr tablet  Commonly known as:  TOPROL-XL      TAKE these medications       albuterol 108 (90 BASE) MCG/ACT inhaler  Commonly known as:  PROVENTIL HFA;VENTOLIN HFA  Inhale 2 puffs into the lungs every 6 (six) hours as needed for wheezing or shortness of breath.     buPROPion 75 MG tablet  Commonly known as:  WELLBUTRIN  Take 1 tablet (75 mg total) by mouth 2 (two) times daily.     diltiazem 240 MG 24 hr capsule  Commonly known as:  DILACOR XR  Take 240 mg by mouth daily.     feeding supplement (RESOURCE BREEZE) Liqd  Take 1 Container by mouth 3 (three) times daily between meals.     lactose free nutrition Liqd  Take 237 mLs by mouth 3 (three) times daily with meals.     levofloxacin 750 MG tablet  Commonly known as:  LEVAQUIN  Take 1 tablet (750 mg total) by mouth every other day.  Start taking on:  01/22/2014     metoprolol 50 MG tablet  Commonly known as:  LOPRESSOR  Take 1 tablet (50 mg total) by mouth 2 (two) times daily.     omeprazole 20 MG capsule  Commonly known as:  PRILOSEC  Take 2 capsules (40 mg total) by mouth daily.     oxyCODONE-acetaminophen 10-325 MG per tablet  Commonly known as:  PERCOCET  Take 1 tablet by mouth every 4 (four) hours as needed  for pain. Do not take more than 5 doses in a 24 hour period.     senna-docusate 8.6-50 MG per tablet  Commonly known as:  Senokot-S  Take 1 tablet by mouth daily.     sodium bicarbonate 650 MG tablet  Take 2 tablets (1,300 mg total) by mouth 2 (two) times daily.     tamsulosin 0.4 MG Caps capsule  Commonly known as:  FLOMAX  Take 1 capsule (0.4 mg total) by mouth daily after supper.     tiotropium 18 MCG inhalation capsule  Commonly known as:  SPIRIVA  Place 1 capsule (18 mcg total) into inhaler and inhale daily.     warfarin 5 MG tablet  Commonly known as:  COUMADIN  Take 1 tablet (5 mg total) by mouth daily.        Disposition and follow-up:   [name] was discharged from [facility] in Stable condition.  At the hospital follow up visit please address:  1.  Coumadin dosing, bowel regimen to treat constipation, improvement of urinary symptoms.  2.  Labs / imaging needed at time of follow-up: INR, BMP to check potassium.  3.  Pending labs/ test needing follow-up: None.  Follow-up Appointments:     Follow-up Information   Follow up  with Travis Filler, MD On 01/25/2014. (hospital follow-up 9:15am)    Specialty:  Internal Medicine   Contact information:   [place] [address] [phone]       Follow up with Travis Grayer, MD On 02/28/2014. [facility] follow-8:45am)    Specialty:  Cardiology   Contact information:   [place] [address] [address] [phone]       Follow up with [name], [facility] On 01/25/2014. (Coumadin follow-up 10:00am)    Specialty:  Pharmacist   Contact information:   [place] [address] [phone]       Follow up with [name]. (Registered Nurse, Physical Therapy, Occupational Therapy and Aide. )    Contact information:   [address] [place] [address] [phone]       Discharge Instructions:  Thank you  for allowing [place] to be involved in your healthcare while you were hospitalized at [facility].   Please note that there have been changes to your home medications.  --> PLEASE LOOK AT YOUR DISCHARGE MEDICATION LIST FOR DETAILS.   Please call your PCP if you have any questions or concerns, or any difficulty getting any of your medications.  Please return to the ER if you have worsening of your symptoms or new severe symptoms arise.  Make sure you complete all of your antibiotics, you have two more doses to take.  Avoid foods high in potassium.  Please see the list below for foods that you should avoid.  Reduce your Coumadin dose to 5 mg every day. Discharge Instructions   Call MD for:  difficulty breathing, headache or visual disturbances    Complete by:  As directed      Call MD for:  extreme fatigue    Complete by:  As directed      Call MD for:  persistant dizziness or light-headedness    Complete by:  As directed      Call MD for:  persistant nausea and vomiting    Complete by:  As directed      Call MD for:  redness, tenderness, or signs of infection (pain, swelling, redness, odor or green/yellow discharge around incision site)    Complete by:  As directed      Call MD for:  severe uncontrolled pain    Complete by:  As directed      Call MD for:  temperature >100.4    Complete by:  As directed      Diet - low sodium heart healthy    Complete by:  As directed      Increase activity slowly    Complete by:  As directed            Consultations: Cardiology  Procedures Performed:  Dg Chest Port 1 View  01/17/2014   CLINICAL DATA:  Short of breath.  Atrial fibrillation.  EXAM: PORTABLE CHEST - 1 VIEW  COMPARISON:  06/02/2013.  FINDINGS: There is chronic pleural parenchymal opacity in the left mid and lower lung zone consistent with chronic scarring. Chronic apical pleural thickening. There is associated volume loss. The right lung is hyperexpanded but clear.  Cardiac silhouette is normal in size. No mediastinal or hilar masses.  Right anterior chest wall sequential pacemaker is stable with both leads projecting in the right ventricle.  Bony thorax is intact.  IMPRESSION: No acute cardiopulmonary disease. Stable chronic findings in the left lung.  Electronically Signed   By: Lajean Manes M.D.   On: 01/17/2014 21:08   US Abdomen Limited Ruq  01/19/2014   CLINICAL DATA:  Elevated liver function tests.  EXAM: US ABDOMEN LIMITED - RIGHT UPPER QUADRANT  COMPARISON:  Ultrasound of June 17, 2013.  FINDINGS: Gallbladder:  No gallstones or wall thickening visualized. No sonographic Murphy sign noted. Mild amount of sludge is present within the gallbladder lumen.  Common bile duct:  Diameter: 4.8 mm.  Liver:  Heterogeneous echotexture of hepatic parenchyma is noted suggesting diffuse hepatocellular disease or possibly fatty infiltration. No focal abnormality is noted.  IMPRESSION: Gallbladder sludge is noted, but no gallstones or evidence of cholecystitis is noted.  Heterogeneous echotexture of hepatic parenchyma is noted suggesting diffuse hepatocellular disease or possibly fatty infiltration.   Electronically Signed   By: Sabino Dick M.D.   On: 01/19/2014 08:30   Admission HPI:  Mr. Gantt is a 68 year old gentleman with a history of nonischemic cardiomyopathy, bradycardia-tachycardia syndrome (with a pacer), COPD, stage III chronic kidney disease, Chronic A. Fib (on Coumadin), tobacco abuse, and recent admission (7/24-08/07/2013) for a right lower extremity DVT status post right femoral embolectomy and right lower extremity fasciotomy who presents with a week of decreased appetite and dizziness.   Patient was admitted from clinic when he was found to be in atrial flutter with rates up to 200, orthostatic hypotension, and supratherapeutic INR. Patient states that he has not been able to eat or drink much since his discharge from the hospital. He explains that this is  mostly due to decreased appetite, and denies any significant nausea, vomiting, or abdominal pain. On a typical day, he may not eat much at all, but is able to drink some water. Patient also reports being lightheaded and fatigued with exertion and standing. He also reports having dark and scant urine that is malodorous with associated suprapubic tenderness, dysuria, and hesitancy. He also reports not having a bowel movement in about a week. He denies any chest pain, fevers, chills, productive cough, or abdominal pain. He denies any new pain or swelling around his fasciotomy site. Patient states that he has been compliant with his medications with the exception of the day of presentation. Patient reports that he has been having blood clots since the 90s and denies any history of liver disease.  Hospital Course by problem list: Principal Problem:   Atrial fibrillation with RVR Active Problems:   BRADYCARDIA-TACHYCARDIA SYNDROME   COPD (chronic obstructive pulmonary disease)   Long-term (current) use of anticoagulants   Chronic kidney disease   Non-ischemic cardiomyopathy   Severe malnutrition   PVD (peripheral vascular disease)   UTI (urinary tract infection)   BPH (benign prostatic hyperplasia)   #Atrial fibrillation with RVR Mr. Horsey was admitted from the clinic when he was found to be in atrial flutter with rates up to the 200s.  He was noted to have orthostatic hypertension and a supratherapeutic INR of 6.17.  His elevated rate was thought to medication non-compliance and may have been reactive to his UTI.  He was started back on his diltiazem ER 240 mg daily and metoprolol 50 mg BID with improvement of his rates to the 120s, and cardiology was consulted. They were okay with rates in the 120s and recommended continuing this regimen.  His rates remained stable in the 110-120s until discharge.  Follow up was arranged with cardiology in one month.  #Uncomplicated urinary tract infection Mr.  Cessna complained of urinary discomfort and trouble  urinating on admission.  His urinalysis was consistent with a urinary tract infection, and he was started on levofloxacin for a 7 day treatment (ending 01/24/14).  His dosing was adjusted to 750 mg every other day based on his renal function at pharmacy's recommendations.  His urine culture eventually grew E.coli that was pansensitive, and he was discharged home with levofloxacin to complete his treatment course.  #Benign prostatic hyperplasia Mr. Dolman reported trouble initiating urination small voiding for the last several months.  He was started on Flomax for presumed BPH with significant improvement in his urinary symptoms.  He was given a one month prescription upon discharge.  #Acute on chronic renal insufficiency (CKD stage III) Mr. Spaeth's creatinine was elevated at 3.00 on admission, up from his baseline of 1.6.  This improved to his baseline with rehydration and control of his heart rate, suggesting this was a prerenal process.  #Renal tubular acidosis type IV Mr. Czarnecki has been diagnosed with RTA type IV and takes bicarbonate at home.  His potassium was mildly elevated on admission and responded to treatment with kayexalate.  He was continued on his home bicarbonate and received again prior to discharge with correction of his potassium to 4.9.  #Alcoholic hepatitis Mr. Toran was found to have an elevated alkaline phosphatase 246, AST 109, and ALT 61 on admission.  RUQ ultrasound showed diffuse hepatocellular disease or possible fatty infiltration.  Given Mr. Ohanesian history of extensive alcohol use this was determined to be alcoholic hepatitis.  #Constipation Mr. Hollings reported not having a bowel movement for two weeks on admission, and he did not have a bowel movement after kayexalate treatment.  His constipation was thought to be due to his chronic opioid use.  He was continued on his home Miralax daily and given a mineral oil enema  prior to discharge to produce a bowel movement.  #Severe malnutrition Mr. Bonnette was noted to be cachectic on exam.  Nutrition was consulted and recommended TID meal supplementation, which was given to him during the hospitalization and prescribed on discharge.  #Chronic pain Mr. Chipps has chronic back and leg pain. He was continued on his home Percocet 10-325 q4h PRN while in the hospital.  #Depression Mr. Iodice mood was stable and he was continued on his home bupropion.  #GERD A PPI was continued during the hospitalization, and Mr. Baumbach did not complain of any symptoms.  #COPD Mr. Ventrella's home Spiriva daily and albuterol PRN was continued.  Discharge Vitals:   BP 120/70  Pulse 110  Temp(Src) 98.2 F (36.8 C) (Oral)  Resp 20  Ht 6\' 3"  (1.905 m)  Wt 166 lb 6.4 oz (75.479 kg)  BMI 20.80 kg/m2  SpO2 97%  Discharge Labs:  Results for orders placed during the hospital encounter of 01/17/14 (from the past 24 hour(s))  BASIC METABOLIC PANEL     Status: Abnormal   Collection Time    01/20/14  3:21 PM      Result Value Ref Range   Sodium 142  137 - 147 mEq/L   Potassium 5.5 (*) 3.7 - 5.3 mEq/L   Chloride 109  96 - 112 mEq/L   CO2 21  19 - 32 mEq/L   Glucose, Bld 103 (*) 70 - 99 mg/dL   BUN 24 (*) 6 - 23 mg/dL   Creatinine, Ser 1.57 (*) 0.50 - 1.35 mg/dL   Calcium 8.4  8.4 - 10.5 mg/dL   GFR calc non Af Amer 43 (*) >90 mL/min  GFR calc Af Amer 50 (*) >90 mL/min   Anion gap 12  5 - 15  PROTIME-INR     Status: Abnormal   Collection Time    01/21/14  4:27 AM      Result Value Ref Range   Prothrombin Time 21.4 (*) 11.6 - 15.2 seconds   INR 1.86 (*) 0.00 - 0.16  BASIC METABOLIC PANEL     Status: Abnormal   Collection Time    01/21/14  4:27 AM      Result Value Ref Range   Sodium 143  137 - 147 mEq/L   Potassium 4.9  3.7 - 5.3 mEq/L   Chloride 108  96 - 112 mEq/L   CO2 23  19 - 32 mEq/L   Glucose, Bld 85  70 - 99 mg/dL   BUN 23  6 - 23 mg/dL   Creatinine, Ser  1.59 (*) 0.50 - 1.35 mg/dL   Calcium 8.8  8.4 - 10.5 mg/dL   GFR calc non Af Amer 43 (*) >90 mL/min   GFR calc Af Amer 49 (*) >90 mL/min   Anion gap 12  5 - 15    Signed: Arman Filter, MD 01/21/2014, 11:12 AM    Services Ordered on Discharge: None. Equipment Ordered on Discharge: None.

## 2014-01-21 NOTE — Progress Notes (Signed)
Utilization review completed.  

## 2014-01-22 NOTE — Progress Notes (Signed)
Late entry for 01/21/14 : Pt had a large bowel movement post SMOG laxative prior to discharge. MD updated.

## 2014-01-25 ENCOUNTER — Encounter: Payer: Self-pay | Admitting: Internal Medicine

## 2014-01-25 ENCOUNTER — Ambulatory Visit: Payer: Medicare HMO | Admitting: Audiology

## 2014-01-25 ENCOUNTER — Ambulatory Visit (INDEPENDENT_AMBULATORY_CARE_PROVIDER_SITE_OTHER): Payer: Commercial Managed Care - HMO | Admitting: Internal Medicine

## 2014-01-25 VITALS — BP 90/58 | HR 100 | Temp 97.5°F | Ht 75.0 in | Wt 169.6 lb

## 2014-01-25 DIAGNOSIS — Z23 Encounter for immunization: Secondary | ICD-10-CM

## 2014-01-25 DIAGNOSIS — N390 Urinary tract infection, site not specified: Secondary | ICD-10-CM

## 2014-01-25 DIAGNOSIS — I4891 Unspecified atrial fibrillation: Secondary | ICD-10-CM

## 2014-01-25 DIAGNOSIS — Z0000 Encounter for general adult medical examination without abnormal findings: Secondary | ICD-10-CM

## 2014-01-25 DIAGNOSIS — I4821 Permanent atrial fibrillation: Secondary | ICD-10-CM

## 2014-01-25 DIAGNOSIS — I10 Essential (primary) hypertension: Secondary | ICD-10-CM

## 2014-01-25 DIAGNOSIS — K5900 Constipation, unspecified: Secondary | ICD-10-CM | POA: Insufficient documentation

## 2014-01-25 LAB — BASIC METABOLIC PANEL
BUN: 23 mg/dL (ref 6–23)
CO2: 23 mEq/L (ref 19–32)
CREATININE: 1.92 mg/dL — AB (ref 0.50–1.35)
Calcium: 9.5 mg/dL (ref 8.4–10.5)
Chloride: 106 mEq/L (ref 96–112)
Glucose, Bld: 133 mg/dL — ABNORMAL HIGH (ref 70–99)
Potassium: 5 mEq/L (ref 3.5–5.3)
Sodium: 141 mEq/L (ref 135–145)

## 2014-01-25 MED ORDER — METOPROLOL TARTRATE 50 MG PO TABS: 25.0000 mg | ORAL_TABLET | Freq: Two times a day (BID) | ORAL | Status: DC

## 2014-01-25 NOTE — Patient Instructions (Signed)
General Instructions:  We will be adjusting your medications abit today. You will now be on Lopressor- 25mg  twice a day and continue your Cardizem/diltiazem- 240mg  Daily.   For your constipation, I want you to take two tablets of senakot-S daily. Also increase your intake of fruits and vegetables. If you are still constipated you can added Miralax- one daily as needed.  If your are still dizzy let us know and we can still adjust your medication.  Please remember to follow up with your cardiologist- 02/28/2014- at 8.45am- Dr Rayann Heman.   Please bring your medicines with you each time you come to clinic.  Medicines may include prescription medications, over-the-counter medications, herbal remedies, eye drops, vitamins, or other pills.  Nicotine Addiction Nicotine can act as both a stimulant (excites/activates) and a sedative (calms/quiets). Immediately after exposure to nicotine, there is a "kick" caused in part by the drug's stimulation of the adrenal glands and resulting discharge of adrenaline (epinephrine). The rush of adrenaline stimulates the body and causes a sudden release of sugar. This means that smokers are always slightly hyperglycemic. Hyperglycemic means that the blood sugar is high, just like in diabetics. Nicotine also decreases the amount of insulin which helps control sugar levels in the body. There is an increase in blood pressure, breathing, and the rate of heart beats.  In addition, nicotine indirectly causes a release of dopamine in the brain that controls pleasure and motivation. A similar reaction is seen with other drugs of abuse, such as cocaine and heroin. This dopamine release is thought to cause the pleasurable sensations when smoking. In some different cases, nicotine can also create a calming effect, depending on sensitivity of the smoker's nervous system and the dose of nicotine taken. WHAT HAPPENS WHEN NICOTINE IS TAKEN FOR LONG PERIODS OF TIME?  Long-term use of nicotine  results in addiction. It is difficult to stop.  Repeated use of nicotine creates tolerance. Higher doses of nicotine are needed to get the "kick." When nicotine use is stopped, withdrawal may last a month or more. Withdrawal may begin within a few hours after the last cigarette. Symptoms peak within the first few days and may lessen within a few weeks. For some people, however, symptoms may last for months or longer. Withdrawal symptoms include:   Irritability.  Craving.  Learning and attention deficits.  Sleep disturbances.  Increased appetite. Craving for tobacco may last for 6 months or longer. Many behaviors done while using nicotine can also play a part in the severity of withdrawal symptoms. For some people, the feel, smell, and sight of a cigarette and the ritual of obtaining, handling, lighting, and smoking the cigarette are closely linked with the pleasure of smoking. When stopped, they also miss the related behaviors which make the withdrawal or craving worse. While nicotine gum and patches may lessen the drug aspects of withdrawal, cravings often persist. WHAT ARE THE MEDICAL CONSEQUENCES OF NICOTINE USE?  Nicotine addiction accounts for one-third of all cancers. The top cancer caused by tobacco is lung cancer. Lung cancer is the number one cancer killer of both men and women.  Smoking is also associated with cancers of the:  Mouth.  Pharynx.  Larynx.  Esophagus.  Stomach.  Pancreas.  Cervix.  Kidney.  Ureter.  Bladder.  Smoking also causes lung diseases such as lasting (chronic) bronchitis and emphysema.  It worsens asthma in adults and children.  Smoking increases the risk of heart disease, including:  Stroke.  Heart attack.  Vascular disease.  Aneurysm.  Passive or secondary smoke can also increase medical risks including:  Asthma in children.  Sudden Infant Death Syndrome (SIDS).  Additionally, dropped cigarettes are the leading cause of  residential fire fatalities.  Nicotine poisoning has been reported from accidental ingestion of tobacco products by children and pets. Death usually results in a few minutes from respiratory failure (when a person stops breathing) caused by paralysis. TREATMENT   Medication. Nicotine replacement medicines such as nicotine gum and the patch are used to stop smoking. These medicines gradually lower the dosage of nicotine in the body. These medicines do not contain the carbon monoxide and other toxins found in tobacco smoke.  Hypnotherapy.  Relaxation therapy.  Nicotine Anonymous (a 12-step support program). Find times and locations in your local yellow pages.

## 2014-01-25 NOTE — Assessment & Plan Note (Signed)
On senokot-S- 1 tablet daily. Still constipated today. Last BM- 4 days ago. On chronic Pain meds- Oxycodone- acet- 10-325mg  Q6H.  Plan- Increase Senokot- Two tabs daily, also can take miralax- as needed/ Q48H. - Increase intake of fruits and veggies.

## 2014-01-25 NOTE — Progress Notes (Signed)
Patient ID: [name], male   DOB: 12-Dec-1945, 68 y.o.   MRN: [id number]   Subjective:   Patient ID: [name] male   DOB: 1946/04/12 68 y.o.   MRN: [id number]  HPI: [name] is a 68 y.o. with PMH of Atria fib/Flutter, COPD, Hyperthyroidism HTN, BPH, CKD3, previous tobacco abuse. Pt presented to 1 week hospital follow up. Pt was admitted- 01/17/2014, and discharged- 01/21/2014. Pt was managed for Atria fib with aRVR, though to be due to medication non-compliance and UTI. Today pt complains of Dizziness, present on getting up from lying to sitting position today, and a mild episode while in the shower. No falls or headaches, no chest pains. Pt endorses poor appetite overall, but improved compared to before admission.  Pt had 2-3 episodes of loose stools on Saturday, buts says since sat he has not had a bowel movemnt, and feels constipated. Pt had 2 bowel enemas on admission before discharge. No blood in stools. Pt is on chronic pain meds for chronic pain- unchanged in severity. Pt quit smoking cigarettes- January this year, Quit cold [place].  Past Medical History  Diagnosis Date  . Hypertension   . Chronic systolic heart failure     a. NICM - EF 35% with normal cors 2003. b. Last echo 11/2012 - EF 25-30%.  . Depression   . GERD (gastroesophageal reflux disease)   . Portal vein thrombosis   . Avascular necrosis     right hip s/p replacement  . Gastritis   . Alcohol abuse   . Erectile dysfunction   . Bipolar disorder   . Hydrocele, unspecified   . Intermittent claudication   . Lung nodule     Chest CT scan on 12/14/2008 showed a nodular opacity at the left lung base felt to most likely represent scarring.  Follow-up chest CT scan on 06/02/2010 showed parenchymal scarring in the left apex, left  lower lobe, and lingula; some of this scarring at the left base had a nodular appearance, unchanged. Chest 09/2012 - stable.  . Hyperthyroidism     Likely due to thyroiditis with possible  amiodarone association.  Thyroid scan 08/28/2009 was normal with no focal areas of abnormal increased or decreased activity seen; the uptake of I 131 sodium iodide at 24 hours was 5.7%.  TSH and free T4 normalized by 08/16/2009.  . Intestinal obstruction   . COPD (chronic obstructive pulmonary disease)   . Clostridium difficile colitis   . E coli bacteremia   . DVT (deep venous thrombosis)     He has hypercoagulability with multiple prior DVTs  . Pleural plaque     H/o asbestos exposure. Chest CT on 06/02/2010 showed stable extensive calcified pleural plaques involving the left hemithorax, consistent with asbestos related pleural disease.  . Weight loss     Normal colonoscopy by Dr. Olevia Perches on 11/06/2010.  . Tachycardia-bradycardia syndrome     a. 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 b. St. Jude gen change 2010.  [name] PAF (paroxysmal atrial fibrillation)     Paroxysmal, failed medical therapy with amiodarone but has done very well with multaq;  d/c 2/2 to recurrent AFib;  Tikosyn load c/b Hyperkalemia during admx 05/2013 => rate control strategy  . 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  . Noncompliance   . Atrial flutter   . Thrombocytopenia   . Hyperkalemia     type 4 RTA  .  Pacemaker   . Shortness of breath     "all the time now" (07/02/2013)  . On home oxygen therapy     "2L prn" (07/02/2013)  . Pneumonia   . Cluster headaches     "~ twice/wk" (07/02/2013)  . Osteoarthritis   . Spondylosis   . Arthritis     "hips and legs" (07/02/2013)  . Chronic back pain     "all over my back" (07/02/2013)  . Anxiety   . CKD (chronic kidney disease)     Renal U/S 12/04/2009 showed no pathological findings. Labs 12/04/2009 include normal ESR, C3, C4; neg ANA; SPEP showed nonspecific increase in the alpha-2 region with no M-spike; UPEP showed no monoclonal free light chains; urine IFE showed polyclonal  increase in feree Kappa and/or free Lambda light chains. Baseline Cr reported 1.7-2.5.   Current Outpatient Prescriptions  Medication Sig Dispense Refill  . albuterol (PROVENTIL HFA;VENTOLIN HFA) 108 (90 BASE) MCG/ACT inhaler Inhale 2 puffs into the lungs every 6 (six) hours as needed for wheezing or shortness of breath.      [name] buPROPion (WELLBUTRIN) 75 MG tablet Take 1 tablet (75 mg total) by mouth 2 (two) times daily.  60 tablet  5  . diltiazem (DILACOR XR) 240 MG 24 hr capsule Take 240 mg by mouth daily.      . feeding supplement, RESOURCE BREEZE, (RESOURCE BREEZE) LIQD Take 1 Container by mouth 3 (three) times daily between meals.  90 Container  11  . lactose free nutrition (BOOST PLUS) LIQD Take 237 mLs by mouth 3 (three) times daily with meals.  90 Can  0  . metoprolol (LOPRESSOR) 50 MG tablet Take 1 tablet (50 mg total) by mouth 2 (two) times daily.  60 tablet  0  . omeprazole (PRILOSEC) 20 MG capsule Take 2 capsules (40 mg total) by mouth daily.  60 capsule  6  . oxyCODONE-acetaminophen (PERCOCET) 10-325 MG per tablet Take 1 tablet by mouth every 4 (four) hours as needed for pain. Do not take more than 5 doses in a 24 hour period.  140 tablet  0  . senna-docusate (SENOKOT-S) 8.6-50 MG per tablet Take 1 tablet by mouth daily.  30 tablet  0  . sodium bicarbonate 650 MG tablet Take 2 tablets (1,300 mg total) by mouth 2 (two) times daily.  120 tablet  0  . tamsulosin (FLOMAX) 0.4 MG CAPS capsule Take 1 capsule (0.4 mg total) by mouth daily after supper.  30 capsule  0  . tiotropium (SPIRIVA) 18 MCG inhalation capsule Place 1 capsule (18 mcg total) into inhaler and inhale daily.  30 capsule  11  . warfarin (COUMADIN) 5 MG tablet Take 1 tablet (5 mg total) by mouth daily.  30 tablet  0   No current facility-administered medications for this visit.   Family History  Problem Relation Age of Onset  . Hypertension Mother   . Cancer Brother     Unsure of type.  . Asthma Mother   . Coronary  artery disease Mother   . Colon cancer Neg Hx   . Lung cancer Neg Hx   . Prostate cancer Neg Hx    History   Social History  . Marital Status: Widowed    Spouse Name: N/A    Number of Children: 10  . Years of Education: N/A   Occupational History  . RETIRED    Social History Main Topics  . Smoking status: Former Smoker -- 1.50 packs/day for 54 years  Types: Cigarettes    Quit date: 06/08/2013  . Smokeless tobacco: Never Used  . Alcohol Use: No     Comment: 07/02/2013 "h/o etoh abuse; stopped drinking ~ 2012"  . Drug Use: No     Comment: 07/02/2013 "stopped marijuana   . Sexual Activity: Not Currently   Other Topics Concern  . None   Social History Narrative   Works as a Architect part time   Smoker 1 pack last 1.5 days tobacco, smokes marijuana    Denies EtOH x 4 years (on 10/12/12)   12 kids    From [place]   [place] Veteran    12 grade education             Review of Systems: CONSTITUTIONAL- No Fever, weightloss, night sweat or change in appetite. SKIN- No Rash, colour changes or itching. HEAD- No Headache, endorses dizziness. EYES- No Vision loss, pain, redness, double or blurred vision. RESPIRATORY- No Cough or SOB. CARDIAC- No Palpitations, DOE, PND or chest pain. GI- No nausea, vomiting, diarrhoea, constipation, abd pain. URINARY- No Frequency, urgency, straining or dysuria. NEUROLOGIC- No Numbness, syncope, seizures or burning. [facility]- Denies depression or anxiety.  Objective:  Physical Exam: Filed Vitals:   01/25/14 0932 01/25/14 0946  BP: 91/61 90/58  Pulse: 69 100  Temp: 97.5 F (36.4 C)   TempSrc: Oral   Height: '6\' 3"'  (1.905 m)   Weight: 169 lb 9.6 oz (76.93 kg)   SpO2: 100%    GENERAL- alert, co-operative, appears as stated age, not in any distress. HEENT- Atraumatic, normocephalic, PERRL, EOMI, oral mucosa appears moist, neck supple. CARDIAC- Irreg rate and rhythm, no murmurs, rubs or gallops. RESP- Moving equal volumes of air, and  clear to auscultation bilaterally, no wheezes or crackles. ABDOMEN- Soft, nontender, no palpable masses or organomegaly, bowel sounds present. NEURO- No obvious Cr N abnormality, strenght upper - intact, in a  Wheel chair. EXTREMITIES- pulse 2+, symmetric, no pedal edema. SKIN- Warm, dry, hyperpig right lower extremity, with a 6cm healing incision- surgical, with a scab on lat side of right leg, surg for PVD. PSYCH- Normal mood and affect, appropriate thought content and speech.  Assessment & Plan:  The patient's case and plan of care was discussed with attending physician, Dr. Chinita Pester.  Please see problem based charting for assessment and plan.

## 2014-01-25 NOTE — Assessment & Plan Note (Addendum)
Pt with dizziness today on Hospital discharge regimen- 240mg  Diltiazem daily and 50mg  BID metop. Rate today- 100s. BP here- systolic- 01T. Also on Warfarin-compliant, last INR- 01/21/2014- 1.86. Denies bleeding from Orifices, or dark stools.   Plan- Reduce dose of metop- 25mg  BID - Cont cardizem- 240mg  Daily - Emphasized keeping his appointment with Cardiology- in Oct. - Cont warfarin, INR check today.

## 2014-01-25 NOTE — Assessment & Plan Note (Addendum)
BP Readings from Last 3 Encounters:  01/25/14 90/58  01/21/14 95/68  01/17/14 104/75    Lab Results  Component Value Date   NA 143 01/21/2014   K 4.9 01/21/2014   CREATININE 1.59* 01/21/2014    Assessment: Blood pressure control:  Hypotensive Progress toward BP goal:   Hypotensive Comments: Pt on 240mg  diltiazem and 50mg  BID of metoprolol. Regimen since admission, for Atria fib/flutter. Similar regimen prior to admission- but with 50mg  Long acting metop Daily. There was ?complaince prior to admission. But pt is he has been complaint since admission.  Plan: Medications:  Reduce metop to 25mg  BID. Metop more likely of the two to cause orthostatic hypotension, and affect blood pressure, as Cardizem is more active on the heart than blood vessels. Cont Cardizem- 240mg  Daily. Self management tools provided: home blood pressure logbook Other plans: See in 2 weeks. - BMEt today

## 2014-01-25 NOTE — Assessment & Plan Note (Signed)
Completed 7 day course of levofloxacin- 750mg  Q48H- Renal Dosing. All urinary symptoms have resolved.

## 2014-01-25 NOTE — Assessment & Plan Note (Signed)
Flu shot today 

## 2014-01-26 ENCOUNTER — Ambulatory Visit: Payer: Commercial Managed Care - HMO | Admitting: Internal Medicine

## 2014-01-26 NOTE — Progress Notes (Signed)
Case discussed with Dr. Emokpae at the time of the visit.  We reviewed the resident's history and exam and pertinent patient test results.  I agree with the assessment, diagnosis, and plan of care documented in the resident's note. 

## 2014-01-31 ENCOUNTER — Ambulatory Visit: Payer: Commercial Managed Care - HMO

## 2014-02-02 ENCOUNTER — Ambulatory Visit: Payer: Medicare HMO | Attending: Internal Medicine | Admitting: Audiology

## 2014-02-03 ENCOUNTER — Other Ambulatory Visit: Payer: Self-pay | Admitting: *Deleted

## 2014-02-03 MED ORDER — OXYCODONE-ACETAMINOPHEN 10-325 MG PO TABS: 1.0000 | ORAL_TABLET | ORAL | Status: DC | PRN

## 2014-02-03 NOTE — Telephone Encounter (Signed)
Refill printed and signed and provided to refill nurse. 

## 2014-02-04 NOTE — Telephone Encounter (Signed)
informed

## 2014-02-06 ENCOUNTER — Other Ambulatory Visit: Payer: Self-pay | Admitting: Adult Health

## 2014-02-07 ENCOUNTER — Ambulatory Visit: Payer: Commercial Managed Care - HMO

## 2014-02-08 ENCOUNTER — Telehealth: Payer: Self-pay | Admitting: *Deleted

## 2014-02-08 NOTE — Telephone Encounter (Signed)
I would ask the pt to continue with his normal dose for now and avoid greens. Please make an appointment for hiom to see Dr. Elie Confer this week. Thank you

## 2014-02-08 NOTE — Telephone Encounter (Signed)
Call from [facility] with [facility] - [phone]  Pt did not come in Monday for INR check.  She checked while in the home and reports INR of 1.3 He takes coumadin 5 mg daily.  He missed one dose on Sunday. He had power greens on Sunday.  Please advise I paged Dr Elie Confer but no answer.

## 2014-02-09 ENCOUNTER — Ambulatory Visit: Payer: Commercial Managed Care - HMO | Admitting: Internal Medicine

## 2014-02-09 NOTE — Telephone Encounter (Signed)
I talked with Dr Elie Confer today and he increased Coumadin to 7.5 mg W, Th, Fr, Sat, then return to coumadin 5 mg a day.   Please come to clinic on Monday for INR recheck. HHN informed and will talk with pt.

## 2014-02-17 ENCOUNTER — Other Ambulatory Visit (HOSPITAL_COMMUNITY): Payer: Self-pay | Admitting: Internal Medicine

## 2014-02-21 ENCOUNTER — Ambulatory Visit (INDEPENDENT_AMBULATORY_CARE_PROVIDER_SITE_OTHER): Payer: Commercial Managed Care - HMO | Admitting: Pharmacist

## 2014-02-21 DIAGNOSIS — Z7901 Long term (current) use of anticoagulants: Secondary | ICD-10-CM

## 2014-02-21 DIAGNOSIS — I81 Portal vein thrombosis: Secondary | ICD-10-CM

## 2014-02-21 LAB — POCT INR: INR: 2.6

## 2014-02-21 MED ORDER — WARFARIN SODIUM 5 MG PO TABS: ORAL_TABLET | ORAL | Status: DC

## 2014-02-21 NOTE — Patient Instructions (Signed)
Patient instructed to take medications as defined in the Anti-coagulation Track section of this encounter.  Patient instructed to take today's dose.  Patient verbalized understanding of these instructions.    

## 2014-02-21 NOTE — Progress Notes (Signed)
Indication: Permament atrial fibrillation. Duration: Lifelong. INR: At target. Agree with Dr. Gladstone Pih assessment and plan.

## 2014-02-21 NOTE — Progress Notes (Signed)
Anti-Coagulation Progress Note  [name] is a 68 y.o. male who is currently on an anti-coagulation regimen.    RECENT RESULTS: Recent results are below, the most recent result is correlated with a dose of 52.5 mg. per week: Lab Results  Component Value Date   INR 2.60 02/21/2014   INR 1.86* 01/21/2014   INR 2.22* 01/20/2014    ANTI-COAG DOSE: Anticoagulation Dose Instructions as of 02/21/2014     [name] Tue Wed Thu Fri Sat   New Dose 7.5 mg 7.5 mg 7.5 mg 7.5 mg 7.5 mg 7.5 mg 7.5 mg       ANTICOAG SUMMARY: Anticoagulation Episode Summary   Current INR goal   Next INR check 03/14/2014  INR from last check 2.60 (02/21/2014)  Weekly max dose   Target end date   INR check location   Preferred lab   Send INR reminders to ANTICOAG IMP   Indications  Long-term (current) use of anticoagulants [Z79.01] Atrila fibrillation (Resolved) [I48.91] 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]        Comments       Anticoagulation Care Providers   Provider Role Specialty Phone number   Axel Filler, MD  Internal Medicine [phone]      ANTICOAG TODAY: Anticoagulation Summary as of 02/21/2014   INR goal   Selected INR 2.60 (02/21/2014)  Next INR check 03/14/2014  Target end date    Indications  Long-term (current) use of anticoagulants [Z79.01] Atrila fibrillation (Resolved) [I48.91] 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]      Anticoagulation Episode Summary   INR check location    Preferred lab    Send INR reminders to ANTICOAG IMP   Comments     Anticoagulation Care Providers   Provider Role Specialty Phone number   Axel Filler, MD  Internal Medicine [phone]      PATIENT INSTRUCTIONS: Patient Instructions  Patient instructed to take medications as defined in the Anti-coagulation Track section of this encounter.  Patient instructed to take today's dose.  Patient verbalized understanding of these instructions.       FOLLOW-UP Return in 3 weeks (on 03/14/2014) for  Follow up INR at [place], III Pharm.D., CACP

## 2014-02-23 ENCOUNTER — Other Ambulatory Visit (HOSPITAL_COMMUNITY): Payer: Self-pay | Admitting: Internal Medicine

## 2014-02-23 ENCOUNTER — Other Ambulatory Visit: Payer: Self-pay | Admitting: Adult Health

## 2014-02-23 ENCOUNTER — Other Ambulatory Visit: Payer: Self-pay | Admitting: Internal Medicine

## 2014-02-28 ENCOUNTER — Encounter: Payer: Commercial Managed Care - HMO | Admitting: Internal Medicine

## 2014-02-28 ENCOUNTER — Telehealth: Payer: Self-pay | Admitting: Internal Medicine

## 2014-02-28 NOTE — Telephone Encounter (Signed)
[name] has called and left him a message we can not see him today but can work in on Wed at 10:00am.  She has asked he call back to confirm

## 2014-02-28 NOTE — Telephone Encounter (Signed)
New message     Cannot make appt this morning---want to come this afternoon.  I told pt there was not openings for this pm---ask him if he would like to reschedule.  He replied "Just have [name] call me".

## 2014-03-02 ENCOUNTER — Other Ambulatory Visit: Payer: Self-pay | Admitting: *Deleted

## 2014-03-03 MED ORDER — OXYCODONE-ACETAMINOPHEN 10-325 MG PO TABS: 1.0000 | ORAL_TABLET | ORAL | Status: DC | PRN

## 2014-03-03 NOTE — Telephone Encounter (Signed)
Refill printed and signed - nurse to complete. 

## 2014-03-03 NOTE — Telephone Encounter (Signed)
Pt informed

## 2014-03-05 ENCOUNTER — Other Ambulatory Visit: Payer: Self-pay | Admitting: Adult Health

## 2014-03-09 ENCOUNTER — Encounter: Payer: Commercial Managed Care - HMO | Admitting: Internal Medicine

## 2014-03-14 ENCOUNTER — Ambulatory Visit (INDEPENDENT_AMBULATORY_CARE_PROVIDER_SITE_OTHER): Payer: Commercial Managed Care - HMO | Admitting: Pharmacist

## 2014-03-14 DIAGNOSIS — Z7901 Long term (current) use of anticoagulants: Secondary | ICD-10-CM

## 2014-03-14 DIAGNOSIS — I81 Portal vein thrombosis: Secondary | ICD-10-CM

## 2014-03-14 LAB — POCT INR: INR: 2.9

## 2014-03-14 MED ORDER — WARFARIN SODIUM 5 MG PO TABS: ORAL_TABLET | ORAL | Status: DC

## 2014-03-14 NOTE — Patient Instructions (Signed)
Patient instructed to take medications as defined in the Anti-coagulation Track section of this encounter.  Patient instructed to take today's dose.  Patient verbalized understanding of these instructions.    

## 2014-03-14 NOTE — Progress Notes (Signed)
Indication: Permanent atrial fibrillation. Duration: Lifelong. INR: At target. Agree with Dr. Groce's assessment and plan. 

## 2014-03-14 NOTE — Progress Notes (Signed)
Anti-Coagulation Progress Note  [name] is a 68 y.o. male who is currently on an anti-coagulation regimen.    RECENT RESULTS: Recent results are below, the most recent result is correlated with a dose of 52.5 mg. per week: Lab Results  Component Value Date   INR 2.90 03/14/2014   INR 2.60 02/21/2014   INR 1.86* 01/21/2014    ANTI-COAG DOSE: Anticoagulation Dose Instructions as of 03/14/2014     [name] Tue Wed Thu Fri Sat   New Dose 7.5 mg 5 mg 7.5 mg 7.5 mg 7.5 mg 7.5 mg 7.5 mg       ANTICOAG SUMMARY: Anticoagulation Episode Summary   Current INR goal   Next INR check 04/04/2014  INR from last check 2.90 (03/14/2014)  Weekly max dose   Target end date   INR check location   Preferred lab   Send INR reminders to ANTICOAG IMP   Indications  Long-term (current) use of anticoagulants [Z79.01] Atrila fibrillation (Resolved) [I48.91] 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]        Comments       Anticoagulation Care Providers   Provider Role Specialty Phone number   Axel Filler, MD  Internal Medicine [phone]      ANTICOAG TODAY: Anticoagulation Summary as of 03/14/2014   INR goal   Selected INR 2.90 (03/14/2014)  Next INR check 04/04/2014  Target end date    Indications  Long-term (current) use of anticoagulants [Z79.01] Atrila fibrillation (Resolved) [I48.91] 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]      Anticoagulation Episode Summary   INR check location    Preferred lab    Send INR reminders to ANTICOAG IMP   Comments     Anticoagulation Care Providers   Provider Role Specialty Phone number   Axel Filler, MD  Internal Medicine [phone]      PATIENT INSTRUCTIONS: Patient Instructions  Patient instructed to take medications as defined in the Anti-coagulation Track section of this encounter.  Patient instructed to take today's dose.  Patient verbalized understanding of these instructions.       FOLLOW-UP Return in 3 weeks (on 04/04/2014)  for Follow up INR at 4:30PM.  Jorene Guest, III Pharm.D., CACP

## 2014-03-20 ENCOUNTER — Other Ambulatory Visit: Payer: Self-pay | Admitting: Physician Assistant

## 2014-03-22 ENCOUNTER — Encounter: Payer: Self-pay | Admitting: *Deleted

## 2014-03-22 ENCOUNTER — Other Ambulatory Visit: Payer: Self-pay

## 2014-03-24 ENCOUNTER — Other Ambulatory Visit: Payer: Self-pay | Admitting: Internal Medicine

## 2014-03-25 ENCOUNTER — Encounter: Payer: Self-pay | Admitting: Internal Medicine

## 2014-03-28 ENCOUNTER — Other Ambulatory Visit: Payer: Self-pay | Admitting: *Deleted

## 2014-03-28 ENCOUNTER — Other Ambulatory Visit: Payer: Self-pay | Admitting: Adult Health

## 2014-03-29 MED ORDER — OXYCODONE-ACETAMINOPHEN 10-325 MG PO TABS: 1.0000 | ORAL_TABLET | ORAL | Status: DC | PRN

## 2014-03-29 NOTE — Telephone Encounter (Signed)
Refill printed and signed and provided to refill nurse.  Please schedule a follow-up appointment in clinic within 1 month, and an appointment with me as soon as available.

## 2014-03-29 NOTE — Telephone Encounter (Signed)
Spoke w/ pt, he states he will be out Friday, he also was ask to make an appt and to please try very hard to come to his appts. He was agreeable and states he will do so

## 2014-03-31 ENCOUNTER — Other Ambulatory Visit: Payer: Self-pay | Admitting: Adult Health

## 2014-04-04 ENCOUNTER — Ambulatory Visit: Payer: Commercial Managed Care - HMO

## 2014-04-05 ENCOUNTER — Encounter: Payer: Commercial Managed Care - HMO | Admitting: Internal Medicine

## 2014-04-12 ENCOUNTER — Ambulatory Visit: Payer: Medicare HMO | Admitting: Vascular Surgery

## 2014-04-12 ENCOUNTER — Encounter (HOSPITAL_COMMUNITY): Payer: Medicare HMO

## 2014-04-13 ENCOUNTER — Other Ambulatory Visit: Payer: Self-pay | Admitting: Internal Medicine

## 2014-04-13 NOTE — Telephone Encounter (Signed)
Please have patient follow-up with Dr. Elie Confer in the anticoagulation clinic next week for INR check.

## 2014-04-18 ENCOUNTER — Other Ambulatory Visit: Payer: Self-pay | Admitting: Adult Health

## 2014-04-18 ENCOUNTER — Encounter: Payer: Self-pay | Admitting: Vascular Surgery

## 2014-04-18 ENCOUNTER — Ambulatory Visit (INDEPENDENT_AMBULATORY_CARE_PROVIDER_SITE_OTHER): Payer: Commercial Managed Care - HMO | Admitting: Pharmacist

## 2014-04-18 DIAGNOSIS — I81 Portal vein thrombosis: Secondary | ICD-10-CM

## 2014-04-18 DIAGNOSIS — Z7901 Long term (current) use of anticoagulants: Secondary | ICD-10-CM

## 2014-04-18 LAB — POCT INR: INR: 1.2

## 2014-04-18 NOTE — Patient Instructions (Signed)
Patient instructed to take medications as defined in the Anti-coagulation Track section of this encounter.  Patient instructed to take today's dose.  Patient verbalized understanding of these instructions.    

## 2014-04-18 NOTE — Progress Notes (Signed)
Anti-Coagulation Progress Note  SUDAIS BANGHART is a 68 y.o. male who is currently on an anti-coagulation regimen.    RECENT RESULTS: Recent results are below, the most recent result is correlated with a dose of 50 mg. per week: Lab Results  Component Value Date   INR 1.20 04/18/2014   INR 2.90 03/14/2014   INR 2.60 02/21/2014    ANTI-COAG DOSE: Anticoagulation Dose Instructions as of 04/18/2014      [name] Tue Wed Thu Fri Sat   New Dose 7.5 mg 7.5 mg 7.5 mg 7.5 mg 7.5 mg 7.5 mg 7.5 mg       ANTICOAG SUMMARY: Anticoagulation Episode Summary    Current INR goal   Next INR check 05/09/2014  INR from last check 1.20 (04/18/2014)  Weekly max dose   Target end date   INR check location   Preferred lab   Send INR reminders to ANTICOAG IMP   Indications  Long-term (current) use of anticoagulants [Z79.01] Atrila fibrillation (Resolved) [I48.91] 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]        Comments       Anticoagulation Care Providers    Provider Role Specialty Phone number   Axel Filler, MD  Internal Medicine [phone]      ANTICOAG TODAY: Anticoagulation Summary as of 04/18/2014    INR goal   Selected INR 1.20 (04/18/2014)  Next INR check 05/09/2014  Target end date    Indications  Long-term (current) use of anticoagulants [Z79.01] Atrila fibrillation (Resolved) [I48.91] 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]      Anticoagulation Episode Summary    INR check location    Preferred lab    Send INR reminders to ANTICOAG IMP   Comments     Anticoagulation Care Providers    Provider Role Specialty Phone number   Axel Filler, MD  Internal Medicine [phone]      PATIENT INSTRUCTIONS: Patient Instructions  Patient instructed to take medications as defined in the Anti-coagulation Track section of this encounter.  Patient instructed to take today's dose.  Patient verbalized understanding of these instructions.       FOLLOW-UP Return in about 3 weeks  (around 05/09/2014) for Follow up INR at 1130h.  Jorene Guest, III Pharm.D., CACP

## 2014-04-19 ENCOUNTER — Ambulatory Visit: Payer: Medicare HMO | Admitting: Vascular Surgery

## 2014-04-19 ENCOUNTER — Telehealth: Payer: Self-pay | Admitting: *Deleted

## 2014-04-19 ENCOUNTER — Encounter (HOSPITAL_COMMUNITY): Payer: Medicare HMO

## 2014-04-19 NOTE — Telephone Encounter (Signed)
Pt called handicapped sticker has expired.  Has form from [facility] - will bring form to clinic and to ask for C. [place]. Hilda Blades Marquasha Brutus RN 04/19/14 3:30PM

## 2014-04-25 ENCOUNTER — Other Ambulatory Visit: Payer: Self-pay | Admitting: Internal Medicine

## 2014-04-28 ENCOUNTER — Other Ambulatory Visit: Payer: Self-pay | Admitting: *Deleted

## 2014-04-28 IMAGING — US US RENAL
1 series · 14 of 25 positions shown · non-contrast
Comparison: CT abdomen pelvis of 10/12/2012

CLINICAL DATA: Chronic kidney disease stage III the

RENAL/URINARY TRACT ULTRASOUND COMPLETE

[Series 1: us renal · 0.24mm/px · 14 of 39 slices shown]
[im 1/39]
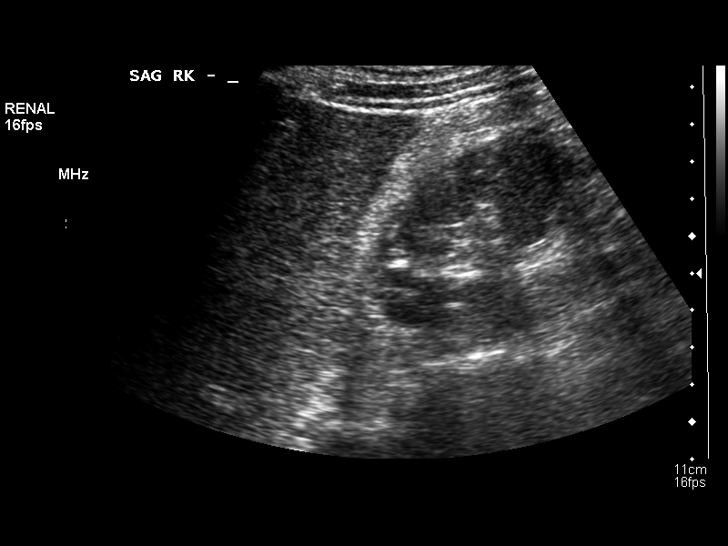
[im 4/39]
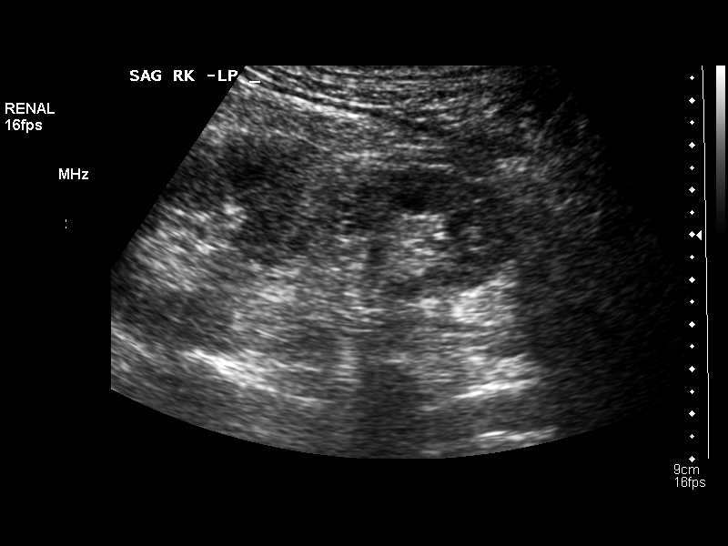
[im 7/39]
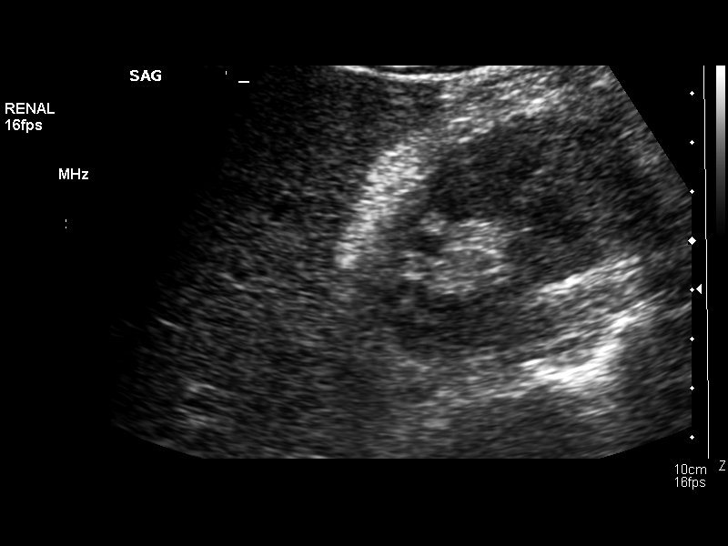
[im 10/39]
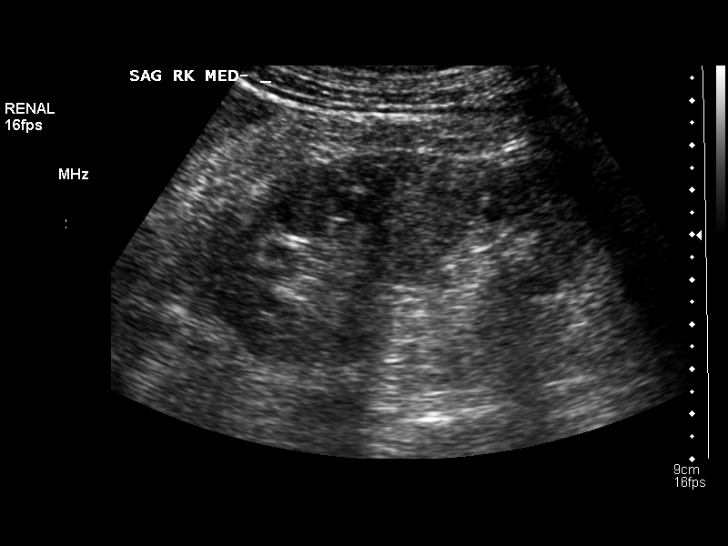
[im 13/39]
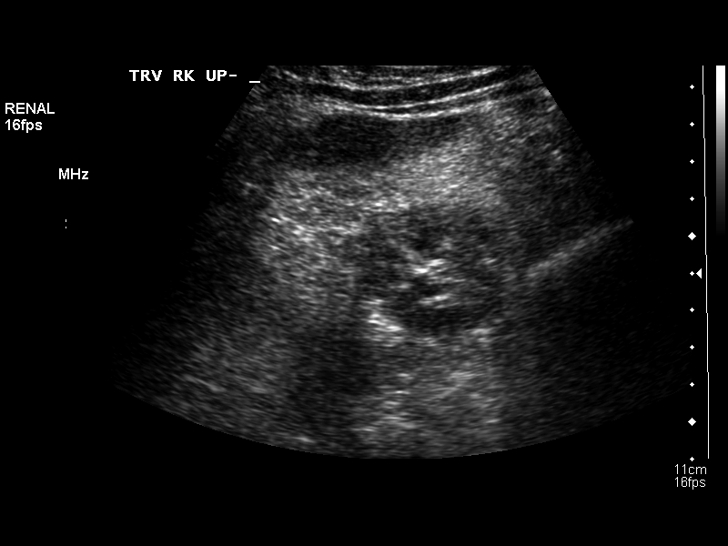
[im 15/39]
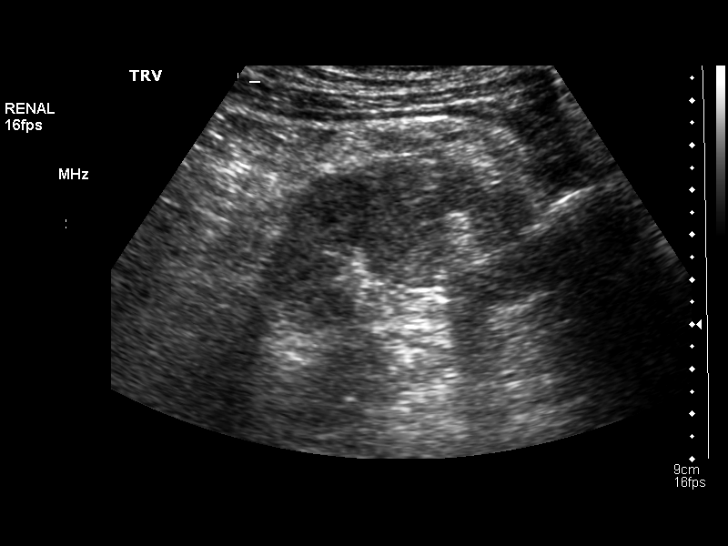
[im 18/39]
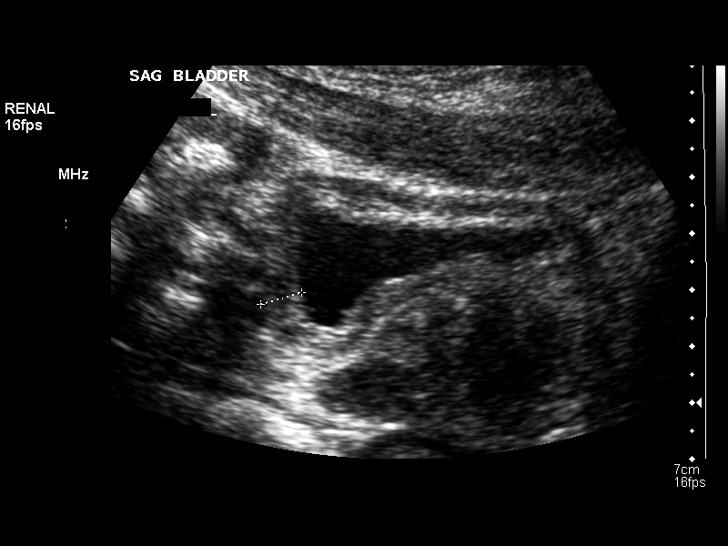
[im 21/39]
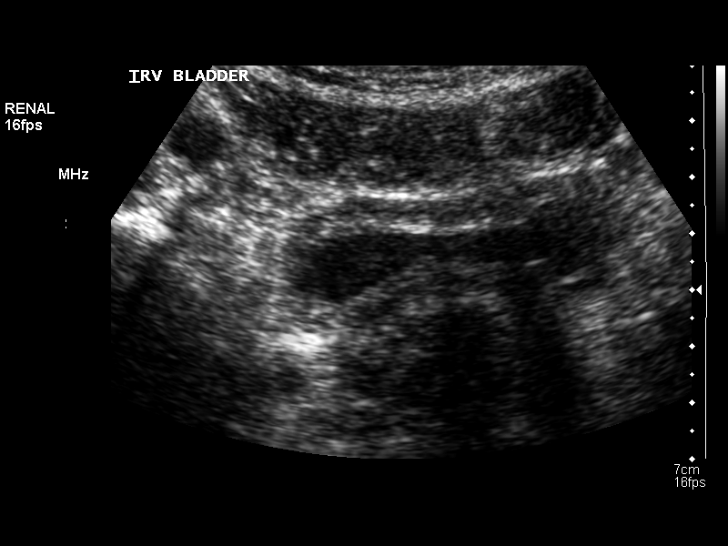
[im 24/39]
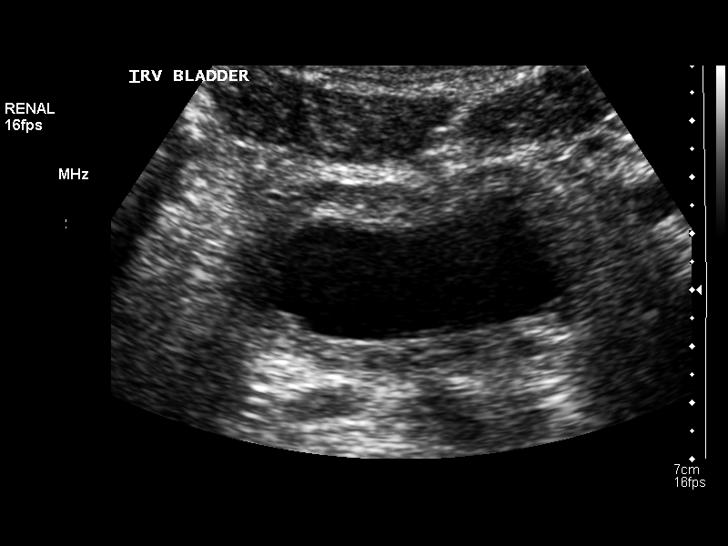
[im 26/39]
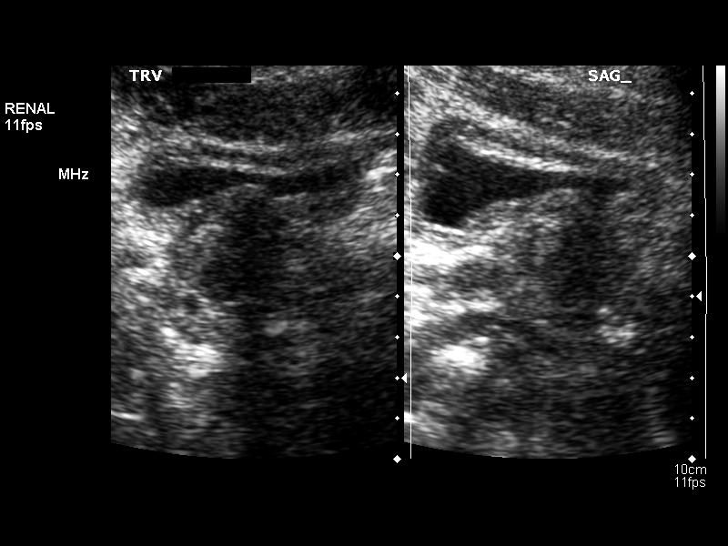
[im 29/39]
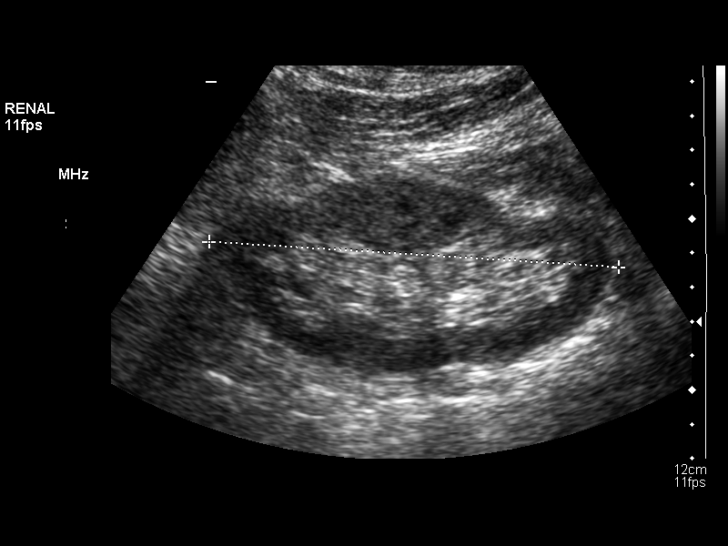
[im 32/39]
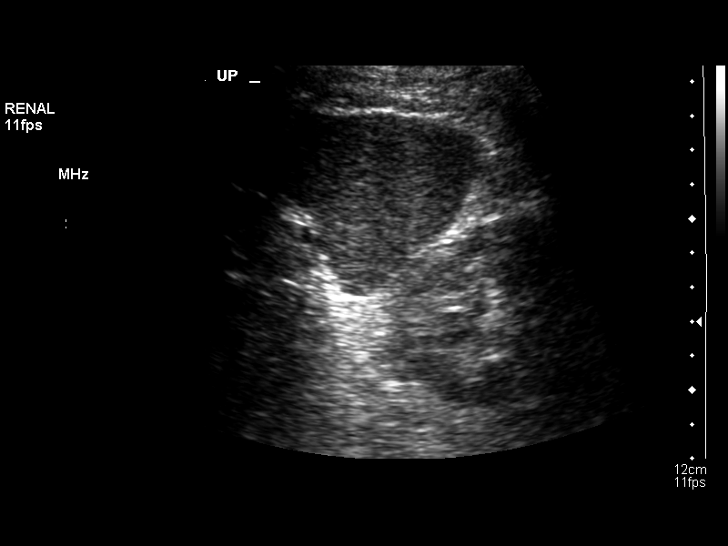
[im 35/39]
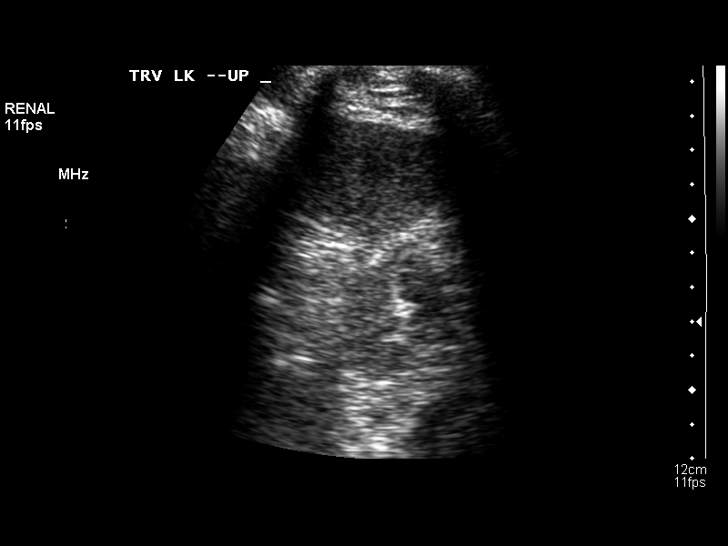
[im 39/39]
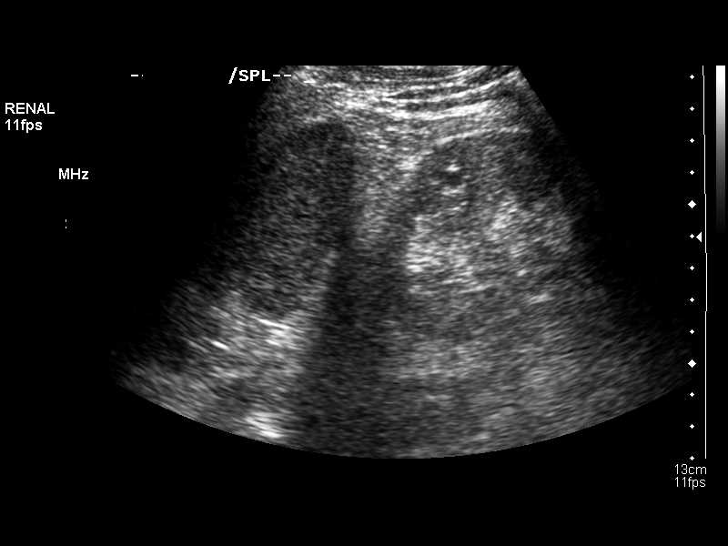

[14 of 25 positions shown; findings below may reference images not displayed]

FINDINGS: Right Kidney:  No hydronephrosis is seen.  The right kidney
measures 10.5 cm sagittally.  A column of breath is noted in the
mid right kidney.

Left Kidney:  No hydronephrosis is noted.  The left kidney measures
12.0 cm.

Bladder:  The urinary bladder is not well distended and there is a
diffusely thickened urinary bladder wall present.  The prostate is
slightly prominent measuring 4.0 x 4.3 x 3.5 cm.
IMPRESSION: 1.  No hydronephrosis.
2.  Nondistended urinary bladder which has a somewhat diffusely
thickened bladder wall of questionable significance.
3.  Slight prominence of the prostate.

## 2014-04-28 MED ORDER — OXYCODONE-ACETAMINOPHEN 10-325 MG PO TABS: 1.0000 | ORAL_TABLET | ORAL | Status: DC | PRN

## 2014-04-28 NOTE — Telephone Encounter (Signed)
Call from pt requesting refill on his percocet.  States he will run out on Sunday 05/01/14- per pharmacy last refill was on 04/03/14, but insurance will pay 2 up to days earlier if approved by MD.  Pt has an appointment already scheduled for Jan 13th @ 1015 with pcp.  Will forward request to pcp for review. Please advise.Regenia Skeeter, Darlene Cassady12/10/20159:25 AM

## 2014-04-28 NOTE — Telephone Encounter (Signed)
Refill printed and signed and provided to refill nurse. 

## 2014-04-28 NOTE — Telephone Encounter (Signed)
Pt contacted and made aware.Regenia Skeeter, Bluma Buresh Cassady12/10/201510:11 AM

## 2014-05-03 ENCOUNTER — Encounter: Payer: Self-pay | Admitting: *Deleted

## 2014-05-09 ENCOUNTER — Ambulatory Visit (INDEPENDENT_AMBULATORY_CARE_PROVIDER_SITE_OTHER): Payer: Commercial Managed Care - HMO | Admitting: Pharmacist

## 2014-05-09 DIAGNOSIS — Z7901 Long term (current) use of anticoagulants: Secondary | ICD-10-CM

## 2014-05-09 DIAGNOSIS — I81 Portal vein thrombosis: Secondary | ICD-10-CM

## 2014-05-09 DIAGNOSIS — I4891 Unspecified atrial fibrillation: Secondary | ICD-10-CM

## 2014-05-09 LAB — POCT INR: INR: 2.4

## 2014-05-09 MED ORDER — WARFARIN SODIUM 5 MG PO TABS: ORAL_TABLET | ORAL | Status: DC

## 2014-05-09 NOTE — Patient Instructions (Signed)
Patient instructed to take medications as defined in the Anti-coagulation Track section of this encounter.  Patient instructed to take today's dose.  Patient verbalized understanding of these instructions.    

## 2014-05-09 NOTE — Progress Notes (Signed)
Anti-Coagulation Progress Note  [name] is a 68 y.o. male who is currently on an anti-coagulation regimen.    RECENT RESULTS: Recent results are below, the most recent result is correlated with a dose of 52.5 mg. per week: Lab Results  Component Value Date   INR 2.40 05/09/2014   INR 1.20 04/18/2014   INR 2.90 03/14/2014    ANTI-COAG DOSE: Anticoagulation Dose Instructions as of 05/09/2014      [name] Tue Wed Thu Fri Sat   New Dose 7.5 mg 7.5 mg 7.5 mg 7.5 mg 7.5 mg 7.5 mg 7.5 mg       ANTICOAG SUMMARY: Anticoagulation Episode Summary    Current INR goal   Next INR check 05/30/2014  INR from last check 2.40 (05/09/2014)  Weekly max dose   Target end date   INR check location   Preferred lab   Send INR reminders to ANTICOAG IMP   Indications  Long-term (current) use of anticoagulants [Z79.01] Atrila fibrillation (Resolved) [I48.91] 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]        Comments       Anticoagulation Care Providers    Provider Role Specialty Phone number   Axel Filler, MD  Internal Medicine [phone]      ANTICOAG TODAY: Anticoagulation Summary as of 05/09/2014    INR goal   Selected INR 2.40 (05/09/2014)  Next INR check 05/30/2014  Target end date    Indications  Long-term (current) use of anticoagulants [Z79.01] Atrila fibrillation (Resolved) [I48.91] 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  Patient instructed to take today's dose.  Patient verbalized understanding of these instructions.       FOLLOW-UP Return in about 3 weeks  (around 05/30/2014) for Follow up INR at 3:30PM.  Jorene Guest, III Pharm.D., CACP

## 2014-05-12 ENCOUNTER — Other Ambulatory Visit: Payer: Self-pay | Admitting: Internal Medicine

## 2014-05-16 NOTE — Telephone Encounter (Signed)
This was refilled on 12/21 with 1 additional refill.  Please call pharmacy and confirm that they received the refill.

## 2014-05-21 DIAGNOSIS — J129 Viral pneumonia, unspecified: Secondary | ICD-10-CM | POA: Diagnosis not present

## 2014-05-21 DIAGNOSIS — N19 Unspecified kidney failure: Secondary | ICD-10-CM | POA: Diagnosis not present

## 2014-05-21 DIAGNOSIS — I4891 Unspecified atrial fibrillation: Secondary | ICD-10-CM | POA: Diagnosis not present

## 2014-05-21 DIAGNOSIS — J449 Chronic obstructive pulmonary disease, unspecified: Secondary | ICD-10-CM | POA: Diagnosis not present

## 2014-05-30 ENCOUNTER — Encounter: Payer: Self-pay | Admitting: Vascular Surgery

## 2014-05-30 ENCOUNTER — Ambulatory Visit: Payer: Commercial Managed Care - HMO

## 2014-05-31 ENCOUNTER — Encounter (HOSPITAL_COMMUNITY): Payer: Commercial Managed Care - HMO

## 2014-05-31 ENCOUNTER — Ambulatory Visit: Payer: Commercial Managed Care - HMO | Admitting: Vascular Surgery

## 2014-06-01 ENCOUNTER — Encounter: Payer: Self-pay | Admitting: Internal Medicine

## 2014-06-01 ENCOUNTER — Other Ambulatory Visit: Payer: Self-pay | Admitting: *Deleted

## 2014-06-01 ENCOUNTER — Telehealth: Payer: Self-pay | Admitting: Pharmacist

## 2014-06-01 ENCOUNTER — Ambulatory Visit (INDEPENDENT_AMBULATORY_CARE_PROVIDER_SITE_OTHER): Payer: Commercial Managed Care - HMO | Admitting: *Deleted

## 2014-06-01 ENCOUNTER — Ambulatory Visit (INDEPENDENT_AMBULATORY_CARE_PROVIDER_SITE_OTHER): Payer: Commercial Managed Care - HMO | Admitting: Internal Medicine

## 2014-06-01 VITALS — BP 101/74 | HR 70 | Temp 97.7°F | Ht 75.0 in | Wt 171.5 lb

## 2014-06-01 DIAGNOSIS — J449 Chronic obstructive pulmonary disease, unspecified: Secondary | ICD-10-CM

## 2014-06-01 DIAGNOSIS — I5042 Chronic combined systolic (congestive) and diastolic (congestive) heart failure: Secondary | ICD-10-CM

## 2014-06-01 DIAGNOSIS — I482 Chronic atrial fibrillation, unspecified: Secondary | ICD-10-CM

## 2014-06-01 DIAGNOSIS — R21 Rash and other nonspecific skin eruption: Secondary | ICD-10-CM

## 2014-06-01 DIAGNOSIS — Z23 Encounter for immunization: Secondary | ICD-10-CM | POA: Diagnosis not present

## 2014-06-01 DIAGNOSIS — N189 Chronic kidney disease, unspecified: Secondary | ICD-10-CM | POA: Diagnosis not present

## 2014-06-01 DIAGNOSIS — R634 Abnormal weight loss: Secondary | ICD-10-CM | POA: Diagnosis not present

## 2014-06-01 DIAGNOSIS — I495 Sick sinus syndrome: Secondary | ICD-10-CM | POA: Diagnosis not present

## 2014-06-01 DIAGNOSIS — F32A Depression, unspecified: Secondary | ICD-10-CM

## 2014-06-01 DIAGNOSIS — Z7901 Long term (current) use of anticoagulants: Secondary | ICD-10-CM

## 2014-06-01 DIAGNOSIS — M25551 Pain in right hip: Secondary | ICD-10-CM

## 2014-06-01 DIAGNOSIS — I10 Essential (primary) hypertension: Secondary | ICD-10-CM | POA: Diagnosis not present

## 2014-06-01 DIAGNOSIS — E0590 Thyrotoxicosis, unspecified without thyrotoxic crisis or storm: Secondary | ICD-10-CM | POA: Diagnosis not present

## 2014-06-01 DIAGNOSIS — M25552 Pain in left hip: Secondary | ICD-10-CM

## 2014-06-01 DIAGNOSIS — F329 Major depressive disorder, single episode, unspecified: Secondary | ICD-10-CM | POA: Diagnosis not present

## 2014-06-01 DIAGNOSIS — I4891 Unspecified atrial fibrillation: Secondary | ICD-10-CM

## 2014-06-01 LAB — CBC WITH DIFFERENTIAL/PLATELET
BASOS ABS: 0 10*3/uL (ref 0.0–0.1)
BASOS PCT: 0 % (ref 0–1)
EOS ABS: 0.4 10*3/uL (ref 0.0–0.7)
EOS PCT: 5 % (ref 0–5)
HEMATOCRIT: 44.7 % (ref 39.0–52.0)
Hemoglobin: 14.7 g/dL (ref 13.0–17.0)
Lymphocytes Relative: 36 % (ref 12–46)
Lymphs Abs: 2.9 10*3/uL (ref 0.7–4.0)
MCH: 29.1 pg (ref 26.0–34.0)
MCHC: 32.9 g/dL (ref 30.0–36.0)
MCV: 88.3 fL (ref 78.0–100.0)
MONOS PCT: 8 % (ref 3–12)
MPV: 10.2 fL (ref 8.6–12.4)
Monocytes Absolute: 0.6 10*3/uL (ref 0.1–1.0)
NEUTROS ABS: 4.1 10*3/uL (ref 1.7–7.7)
Neutrophils Relative %: 51 % (ref 43–77)
Platelets: 168 10*3/uL (ref 150–400)
RBC: 5.06 MIL/uL (ref 4.22–5.81)
RDW: 19 % — AB (ref 11.5–15.5)
WBC: 8 10*3/uL (ref 4.0–10.5)

## 2014-06-01 LAB — MDC_IDC_ENUM_SESS_TYPE_INCLINIC
Battery Voltage: 2.79 V
Date Time Interrogation Session: 20160113124725
Implantable Pulse Generator Model: 5626
Implantable Pulse Generator Serial Number: 2045834
Lead Channel Pacing Threshold Amplitude: 1 V
Lead Channel Pacing Threshold Pulse Width: 0.5 ms
Lead Channel Sensing Intrinsic Amplitude: 4.6 mV
Lead Channel Setting Pacing Amplitude: 2.5 V
Lead Channel Setting Pacing Pulse Width: 0.5 ms
MDC IDC MSMT BATTERY IMPEDANCE: 1000 Ohm — AB
MDC IDC MSMT LEADCHNL RV IMPEDANCE VALUE: 376 Ohm
MDC IDC SET LEADCHNL RV SENSING SENSITIVITY: 2.5 mV

## 2014-06-01 LAB — POCT INR: INR: 1.3

## 2014-06-01 LAB — COMPLETE METABOLIC PANEL WITH GFR
ALBUMIN: 4.2 g/dL (ref 3.5–5.2)
ALT: 16 U/L (ref 0–53)
AST: 21 U/L (ref 0–37)
Alkaline Phosphatase: 60 U/L (ref 39–117)
BUN: 22 mg/dL (ref 6–23)
CO2: 20 mEq/L (ref 19–32)
Calcium: 9.6 mg/dL (ref 8.4–10.5)
Chloride: 111 mEq/L (ref 96–112)
Creat: 1.65 mg/dL — ABNORMAL HIGH (ref 0.50–1.35)
GFR, Est African American: 48 mL/min — ABNORMAL LOW
GFR, Est Non African American: 41 mL/min — ABNORMAL LOW
Glucose, Bld: 86 mg/dL (ref 70–99)
POTASSIUM: 4 meq/L (ref 3.5–5.3)
SODIUM: 143 meq/L (ref 135–145)
TOTAL PROTEIN: 6.6 g/dL (ref 6.0–8.3)
Total Bilirubin: 0.6 mg/dL (ref 0.2–1.2)

## 2014-06-01 LAB — T4, FREE: Free T4: 1.4 ng/dL (ref 0.80–1.80)

## 2014-06-01 LAB — TSH: TSH: 2.237 u[IU]/mL (ref 0.350–4.500)

## 2014-06-01 MED ORDER — WARFARIN SODIUM 5 MG PO TABS: 7.5000 mg | ORAL_TABLET | Freq: Every day | ORAL | Status: DC

## 2014-06-01 MED ORDER — HYDROCORTISONE 2.5 % EX CREA: TOPICAL_CREAM | Freq: Two times a day (BID) | CUTANEOUS | Status: DC

## 2014-06-01 MED ORDER — TAMSULOSIN HCL 0.4 MG PO CAPS: 0.4000 mg | ORAL_CAPSULE | Freq: Every day | ORAL | Status: DC

## 2014-06-01 MED ORDER — OXYCODONE-ACETAMINOPHEN 10-325 MG PO TABS: 1.0000 | ORAL_TABLET | ORAL | Status: DC | PRN

## 2014-06-01 MED ORDER — BUPROPION HCL 75 MG PO TABS: 75.0000 mg | ORAL_TABLET | Freq: Two times a day (BID) | ORAL | Status: DC

## 2014-06-01 NOTE — Progress Notes (Signed)
   Subjective:    Patient ID: [name], male    DOB: 01-10-1946, 69 y.o.   MRN: [id number]  HPI Patient returns for management of his bilateral hip pain, congestive heart failure, chronic kidney disease, COPD, and other chronic medical problems.  He reports some decrease in his appetite since the last visit; he has been using Boost nutritional supplements and his weight has been stable.  He has chronic bilateral hip pain for which he takes oxycodone-acetaminophen, and he reports that the pain is adequately relieved and his function improved on that medication.  He reports that his depression is well controlled on the current dose of bupropion.  He has had some recurrence of a rash on his left lower chest which he says responded to a topical steroid cream in the past.  He missed his appointment in the anticoagulation clinic on Monday of this week; he has not taken his warfarin for the past few days.  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  He did not bring his medications to clinic today, but confirmed his list from memory.  Review of Systems  Constitutional: Negative for fever, chills and diaphoresis.  Respiratory: Positive for shortness of breath (Occasional). Negative for cough and wheezing.   Cardiovascular: Negative for chest pain and leg swelling.  Gastrointestinal: Negative for nausea, vomiting, abdominal pain and blood in stool.  Genitourinary: Negative for dysuria, hematuria and difficulty urinating.  Musculoskeletal: Positive for arthralgias (Bilateral hip pain).  Neurological: Positive for dizziness (Occasional; improved). Negative for syncope and weakness.    I reviewed and updated the medication list, allergies, past medical history, past surgical history, family history, and social history.     Objective:   Physical Exam  Constitutional: No distress.  Cardiovascular: Normal rate, S1 normal and S2 normal.   An irregularly irregular rhythm present. Exam reveals no S3 and no S4.   No murmur heard. No lower extremity edema.  Right foot has adequate capillary refill and appears to be well perfused.  Abdominal: Soft. Normal appearance and bowel sounds are normal. There is no hepatosplenomegaly. There is no tenderness.  Skin:             Assessment & Plan:

## 2014-06-01 NOTE — Progress Notes (Signed)
Pacemaker check in clinic. Normal device function. Thresholds, sensing, impedances consistent with previous measurements. Device programmed to maximize longevity. A-fib, + coumadin with RVR.   Device programmed at appropriate safety margins. Histogram distribution appropriate for patient activity level. Device programmed to optimize intrinsic conduction. Estimated longevity 8.5 years.  Patient education completed.  ROV 6 months with Dr. Rayann Heman.

## 2014-06-01 NOTE — Assessment & Plan Note (Signed)
Wt Readings from Last 3 Encounters:  06/01/14 171 lb 8 oz (77.792 kg)  01/25/14 169 lb 9.6 oz (76.93 kg)  01/21/14 166 lb 6.4 oz (75.479 kg)    Assessment: Patient has no signs or symptoms of decompensation on his current regimen of diltiazem CD 240 mg twice a day and metoprolol 25 mg twice a day.  Plan: Continue current medications.

## 2014-06-01 NOTE — Patient Instructions (Signed)
Plan hydrocortisone 2.5% cream to the rash on chest twice a day for 2 weeks; call or return if this does not resolve. Continue other medications.

## 2014-06-01 NOTE — Assessment & Plan Note (Signed)
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  His TSH and free T4 had normalized by March 2011.    Plan: Check TSH and free T4 today.

## 2014-06-01 NOTE — Assessment & Plan Note (Signed)
.   Lab Results  Component Value Date   INR 1.3 06/01/2014    Assessment: INR is subtherapeutic; patient reports that he has not been taking his warfarin for the past few days.  Plan: Patient was advised to refill and resume his warfarin (also see telephone note by Dr. Elie Confer.)

## 2014-06-01 NOTE — Assessment & Plan Note (Signed)
Assessment: Patient is status post bilateral hip arthroplasty  and has chronic bilateral hip pain.  He reports adequate pain control and improved function on oxycodone-acetaminophen 10-325 mg 1 tablet every 4 hours as needed for pain (not to exceed 5 tablets in 24 hours).  Plan: Continue oxycodone-acetaminophen 10-325 milligrams 1 tablet every 4 hours as needed for pain (not to exceed 5 tablets in 24 hours).  I provided a written prescription today for #140 as per his medication contract.

## 2014-06-01 NOTE — Assessment & Plan Note (Addendum)
Lab Results  Component Value Date   CREATININE 1.92* 01/25/2014   CREATININE 1.59* 01/21/2014   CREATININE 1.57* 01/20/2014   CREATININE 1.61* 01/20/2014   CREATININE 1.86* 01/19/2014    Assessment: Patient's renal function has been reasonably stable; he is followed by nephrologist Dr. Moshe Cipro.  Plan: Check a comprehensive metabolic panel and CBC with differential today; follow up with Dr. Moshe Cipro.

## 2014-06-01 NOTE — Assessment & Plan Note (Signed)
BP Readings from Last 3 Encounters:  06/01/14 101/74  01/25/14 90/58  01/21/14 95/68    Lab Results  Component Value Date   NA 141 01/25/2014   K 5.0 01/25/2014   CREATININE 1.92* 01/25/2014    Assessment: Blood pressure control: controlled Progress toward BP goal:  at goal Comments: Blood pressure is controlled on diltiazem 240 mg twice a day and metoprolol 25 mg twice a day.  Plan: Medications:  continue current medications

## 2014-06-01 NOTE — Assessment & Plan Note (Addendum)
SpO2 Readings from Last 3 Encounters:  06/01/14 100%  01/25/14 100%  01/21/14 100%    Assessment: Patient Is doing well on Spiriva and albuterol as needed.  Plan: Continue Spiriva and PRN albuterol.

## 2014-06-01 NOTE — Assessment & Plan Note (Signed)
Assessment: Patient reports good control of depression with no active symptoms on bupropion 75 mg twice a day.  Plan: Continue bupropion 75 mg twice a day.

## 2014-06-01 NOTE — Assessment & Plan Note (Signed)
Wt Readings from Last 3 Encounters:  06/01/14 171 lb 8 oz (77.792 kg)  01/25/14 169 lb 9.6 oz (76.93 kg)  01/21/14 166 lb 6.4 oz (75.479 kg)    Assessment: Patient's weight is stable to slightly improved despite his report of less appetite.  Plan: I advised him to continue his Boost nutritional supplements.

## 2014-06-01 NOTE — Assessment & Plan Note (Signed)
Assessment: Patient reports improvement in his occasional dizziness following the decrease in his metoprolol dose in September; his rate appears to be adequately controlled on current regimen of diltiazem CD 240 mg twice a day and metoprolol 25 mg twice a day.  He has a pacemaker check scheduled for today at Dr. Jackalyn Lombard office.  Plan: Continue current medications; follow-up with Dr. Rayann Heman.

## 2014-06-01 NOTE — Telephone Encounter (Signed)
Called by Vicki/Tracy of [facility] with INR of 1.3 on 52.5mg  warfarin/wk as 1&1/2 x 5mg  PO QD. Patient has been out of warfarin for several days and is going to pick up his Rx later today. Seen by Dr. Marinda Elk.

## 2014-06-01 NOTE — Assessment & Plan Note (Signed)
Assessment: Patient has a recurrent rash on his left lateral chest.  He was referred to dermatology in April of this year but apparently left the dermatology office because of the required co-pay.  He reports some improvement in the past on topical steroid cream.  Plan: Treat with hydrocortisone 2.5% cream topical twice a day for 2 weeks.  I advised patient to call or return if the rash does not resolve.

## 2014-06-22 ENCOUNTER — Ambulatory Visit (INDEPENDENT_AMBULATORY_CARE_PROVIDER_SITE_OTHER): Payer: Commercial Managed Care - HMO | Admitting: Internal Medicine

## 2014-06-22 ENCOUNTER — Encounter: Payer: Self-pay | Admitting: Internal Medicine

## 2014-06-22 ENCOUNTER — Other Ambulatory Visit (HOSPITAL_COMMUNITY)
Admission: RE | Admit: 2014-06-22 | Discharge: 2014-06-22 | Disposition: A | Discharge status: Home / Self Care | Payer: Commercial Managed Care - HMO | Source: Ambulatory Visit | Attending: Internal Medicine | Admitting: Internal Medicine

## 2014-06-22 VITALS — BP 104/73 | HR 101 | Temp 97.6°F | Ht 75.0 in | Wt 162.3 lb

## 2014-06-22 DIAGNOSIS — F329 Major depressive disorder, single episode, unspecified: Secondary | ICD-10-CM

## 2014-06-22 DIAGNOSIS — Z113 Encounter for screening for infections with a predominantly sexual mode of transmission: Secondary | ICD-10-CM | POA: Insufficient documentation

## 2014-06-22 DIAGNOSIS — R300 Dysuria: Secondary | ICD-10-CM | POA: Diagnosis not present

## 2014-06-22 DIAGNOSIS — F32A Depression, unspecified: Secondary | ICD-10-CM

## 2014-06-22 DIAGNOSIS — I10 Essential (primary) hypertension: Secondary | ICD-10-CM

## 2014-06-22 LAB — POCT URINALYSIS DIPSTICK
Glucose, UA: NEGATIVE
Nitrite, UA: NEGATIVE
PH UA: 6
Protein, UA: 30
Urobilinogen, UA: 4

## 2014-06-22 NOTE — Patient Instructions (Signed)
Your preliminary urine test did not show a very clear infection at this point. This does not mean that you do not have an infection. We just need to wait for your final results. I will contact you regarding the results and whether you need to start antibiotics to help treat an infection.  General Instructions:   Please bring your medicines with you each time you come to clinic.  Medicines may include prescription medications, over-the-counter medications, herbal remedies, eye drops, vitamins, or other pills.   Progress Toward Treatment Goals:  Treatment Goal 06/01/2014  Blood pressure at goal  Stop smoking -  Prevent falls -    Self Care Goals & Plans:  Self Care Goal 06/01/2014  Manage my medications -  Monitor my health -  Eat healthy foods -  Be physically active -  Meeting treatment goals maintain the current self-care plan    No flowsheet data found.   Care Management & Community Referrals:  Referral 06/01/2014  Referrals made for care management support none needed  Referrals made to community resources none

## 2014-06-22 NOTE — Assessment & Plan Note (Addendum)
Patient states that he has been having dysuria and penile discharge productive of white fluid starting 3 days ago. He states that his last sexual encounter was over a year ago in October 2014. He states that it was with a male and did not notice any symptoms after that encounter. Patient also states that his partner had been tested for sexual transmitted infections since then and was negative. Patient denies any recent history of sexually transmitted infections but does report having some STI in his use during his teenage years for which she was treated with penicillin. Patient does report that he has had urinary tract infections in the past. His last urinary tract infection documented in our system was significant for Escherichia coli in August 2015. Urine dipstick today only remarkable for trace leukocytes and blood. HIV and RPR in the system have been negative.  -Will await final urinalysis results -HIV, RPR, gonorrhea and chlamydia PCR urine -Antibiotic therapy pending test results.  Addendum 06/22/14: Unfortunately, I forgot to mention that patient should stop by lab before leaving and he has not received his blood tests. Given his sexual history, there is a lower suspicion for STI. Additionally, patient is not exhibiting any symptoms of acute infection of either infection. However, attention on follow-up is needed as appropriate during his next visit.

## 2014-06-22 NOTE — Assessment & Plan Note (Signed)
Patient states that he has had decreased appetite lately although he reports a good mood lately as well. He states that he is compliant with his bupropion 75 mg twice a day. -Continue with bupropion 75 mg twice a day.

## 2014-06-22 NOTE — Assessment & Plan Note (Signed)
BP Readings from Last 3 Encounters:  06/22/14 104/73  06/01/14 101/74  01/25/14 90/58    Lab Results  Component Value Date   NA 143 06/01/2014   K 4.0 06/01/2014   CREATININE 1.65* 06/01/2014    Assessment: Blood pressure control: controlled Progress toward BP goal:  at goal Comments: Patient states that he is compliant with his diltiazem 240 mg daily, metoprolol 25 mg twice a day. Patient denies any symptoms of lightheadedness.   Plan: Medications:  continue current medications Educational resources provided:   Self management tools provided:   Other plans: none

## 2014-06-22 NOTE — Addendum Note (Signed)
Addended by: Luan Moore on: 06/22/2014 03:38 PM   Modules accepted: Orders

## 2014-06-22 NOTE — Progress Notes (Signed)
Case discussed with Dr. Raelene Bott at the time of the visit.  We reviewed the resident's history and exam and pertinent patient test results.  I agree with the assessment, diagnosis, and plan of care documented in the resident's note.

## 2014-06-22 NOTE — Progress Notes (Signed)
   Subjective:    Patient ID: [name], male    DOB: 03-28-1946, 69 y.o.   MRN: [id number]  HPI  Patient is a 69 year old with a history of bilateral chronic hip pain, combined congestive heart failure, chronic kidney disease, COPD, depression, atrial fibrillation, hyperthyroidism who presents to clinic for penile discharge.   Please refer to separate problem-list charting for more details.  Review of Systems  Constitutional: Negative for fever and chills.  HENT: Negative for sore throat.   Respiratory: Negative for shortness of breath.   Cardiovascular: Negative for chest pain and palpitations.  Gastrointestinal: Negative for nausea, vomiting, abdominal pain, diarrhea, constipation and blood in stool.  Genitourinary: Positive for dysuria and discharge. Negative for hematuria.  Skin: Negative for rash.  Neurological: Negative for syncope.  Psychiatric/Behavioral: Negative for suicidal ideas.       Objective:   Physical Exam  Constitutional: He is oriented to person, place, and time. He appears well-developed and well-nourished. No distress.  HENT:  Head: Normocephalic and atraumatic.  Eyes: EOM are normal. Pupils are equal, round, and reactive to light. No scleral icterus.  Neck: Normal range of motion. Neck supple. No thyromegaly present.  Cardiovascular: Normal rate and regular rhythm.  Exam reveals no gallop and no friction rub.   No murmur heard. Pulmonary/Chest: Effort normal and breath sounds normal. No respiratory distress. He has no wheezes. He has no rales.  Abdominal: Soft. Bowel sounds are normal. He exhibits no distension. There is no tenderness. There is no rebound.  Musculoskeletal: Normal range of motion. He exhibits no edema.  Neurological: He is alert and oriented to person, place, and time. No cranial nerve deficit.  Skin: No rash noted.          Assessment & Plan:  Please refer to separate problem-list charting for more details.

## 2014-06-23 LAB — URINALYSIS, COMPLETE
Bacteria, UA: NONE SEEN
CASTS: NONE SEEN
CRYSTALS: NONE SEEN
Glucose, UA: NEGATIVE mg/dL
Hgb urine dipstick: NEGATIVE
NITRITE: NEGATIVE
PH: 6 (ref 5.0–8.0)
Protein, ur: 30 mg/dL — AB
SPECIFIC GRAVITY, URINE: 1.027 (ref 1.005–1.030)
Urobilinogen, UA: 1 mg/dL (ref 0.0–1.0)

## 2014-06-23 LAB — URINE CYTOLOGY ANCILLARY ONLY
Chlamydia: NEGATIVE
Neisseria Gonorrhea: NEGATIVE

## 2014-06-24 ENCOUNTER — Telehealth: Payer: Self-pay | Admitting: Internal Medicine

## 2014-06-24 ENCOUNTER — Other Ambulatory Visit: Payer: Self-pay | Admitting: Internal Medicine

## 2014-06-24 MED ORDER — CIPROFLOXACIN HCL 250 MG PO TABS: 250.0000 mg | ORAL_TABLET | Freq: Two times a day (BID) | ORAL | Status: DC

## 2014-06-24 NOTE — Telephone Encounter (Signed)
Patient was called and informed of his STI tests results as well as the fact that he has a urinary tract infection. Patient has a history of stage III chronic kidney disease which makes Bactrim less favored. I prescribed Cipro 250 mg twice a day which is renally dosed for his calculated creatinine clearance of 43.4 mL/min. I counseled the patient on the risk of developing diarrhea secondary to a C. difficile infection and patient voiced understanding of the risks and agrees with the plan of intended treatment. Unfortunately, a urine culture was not ordered in reflex to the urinalysis results. Accordingly, I notified patient to come back to clinic to see [place] if he develops diarrhea or if his symptoms do not improve within 3 days. I would plan on getting a urine culture at that visit if patient's symptoms are refractory to therapy.

## 2014-06-27 ENCOUNTER — Encounter: Payer: Self-pay | Admitting: Vascular Surgery

## 2014-06-28 ENCOUNTER — Encounter (HOSPITAL_COMMUNITY): Payer: Commercial Managed Care - HMO

## 2014-06-28 ENCOUNTER — Other Ambulatory Visit: Payer: Self-pay | Admitting: *Deleted

## 2014-06-28 ENCOUNTER — Ambulatory Visit: Payer: Commercial Managed Care - HMO | Admitting: Vascular Surgery

## 2014-06-28 NOTE — Telephone Encounter (Signed)
Last refill 1/13 Call when ready @ [phone]

## 2014-06-29 ENCOUNTER — Ambulatory Visit (INDEPENDENT_AMBULATORY_CARE_PROVIDER_SITE_OTHER): Payer: Commercial Managed Care - HMO | Admitting: Internal Medicine

## 2014-06-29 DIAGNOSIS — I482 Chronic atrial fibrillation: Secondary | ICD-10-CM

## 2014-06-30 MED ORDER — OXYCODONE-ACETAMINOPHEN 10-325 MG PO TABS
1.0000 | ORAL_TABLET | ORAL | Status: DC | PRN
Start: 2014-06-30 — End: 2014-07-25

## 2014-06-30 NOTE — Telephone Encounter (Signed)
Refill printed and signed and provided to refill nurse. 

## 2014-06-30 NOTE — Telephone Encounter (Signed)
Pt informed Rx is ready 

## 2014-07-03 NOTE — Progress Notes (Signed)
Appointment cancelled

## 2014-07-11 ENCOUNTER — Encounter: Payer: Self-pay | Admitting: Vascular Surgery

## 2014-07-12 ENCOUNTER — Encounter (HOSPITAL_COMMUNITY): Payer: Commercial Managed Care - HMO

## 2014-07-12 ENCOUNTER — Ambulatory Visit: Payer: Medicare HMO | Admitting: Vascular Surgery

## 2014-07-13 ENCOUNTER — Encounter: Payer: Commercial Managed Care - HMO | Admitting: Internal Medicine

## 2014-07-14 ENCOUNTER — Telehealth: Payer: Self-pay | Admitting: Internal Medicine

## 2014-07-14 ENCOUNTER — Encounter: Payer: Self-pay | Admitting: Internal Medicine

## 2014-07-15 ENCOUNTER — Encounter: Payer: Self-pay | Admitting: Internal Medicine

## 2014-07-17 ENCOUNTER — Other Ambulatory Visit: Payer: Self-pay | Admitting: Internal Medicine

## 2014-07-25 ENCOUNTER — Other Ambulatory Visit: Payer: Self-pay | Admitting: *Deleted

## 2014-07-25 NOTE — Telephone Encounter (Signed)
Last refill 2/12 Pt # [phone]

## 2014-07-26 MED ORDER — OXYCODONE-ACETAMINOPHEN 10-325 MG PO TABS: 1.0000 | ORAL_TABLET | ORAL | Status: DC | PRN

## 2014-07-26 NOTE — Telephone Encounter (Signed)
Pt informed Rx will be ready on Friday. 3/11

## 2014-07-26 NOTE — Telephone Encounter (Signed)
I printed, signed, and provided to refill nurse 3 prescriptions for refill of oxycodone-acetaminophen 10-325 mg one tablet every 4 hours as needed for pain (do not take more than 5 doses in a 24 hour period), #140, as per medication contract, as follows:  A prescription to be provided to patient and filled no earlier than 07/31/2014. A prescription to be filled no earlier than 08/30/2014, to be held on file. A prescription to be filled no earlier than 09/29/2014, to be held on file.  Patient has an appointment with me on 08/10/2014.

## 2014-07-27 ENCOUNTER — Telehealth: Payer: Self-pay | Admitting: *Deleted

## 2014-07-27 ENCOUNTER — Other Ambulatory Visit: Payer: Self-pay | Admitting: Internal Medicine

## 2014-07-27 NOTE — Telephone Encounter (Signed)
Pt called and is asking for early refill for percocet.  It is not due to be filled until 3/13 but pt will be out tomorrow. I talked with Dr Marinda Elk and he will see pt tomorrow in clinic to talk about this. Pt # [phone] Pt informed, he states he will get here between 8:15 and 8:30.

## 2014-07-28 ENCOUNTER — Ambulatory Visit (HOSPITAL_COMMUNITY)
Admission: RE | Admit: 2014-07-28 | Discharge: 2014-07-28 | Disposition: A | Discharge status: Home / Self Care | Payer: Commercial Managed Care - HMO | Source: Ambulatory Visit | Attending: Internal Medicine | Admitting: Internal Medicine

## 2014-07-28 ENCOUNTER — Ambulatory Visit (INDEPENDENT_AMBULATORY_CARE_PROVIDER_SITE_OTHER): Payer: Commercial Managed Care - HMO | Admitting: Internal Medicine

## 2014-07-28 ENCOUNTER — Encounter: Payer: Self-pay | Admitting: Internal Medicine

## 2014-07-28 VITALS — BP 115/88 | HR 87 | Temp 97.9°F | Wt 153.5 lb

## 2014-07-28 DIAGNOSIS — N189 Chronic kidney disease, unspecified: Secondary | ICD-10-CM

## 2014-07-28 DIAGNOSIS — W19XXXA Unspecified fall, initial encounter: Secondary | ICD-10-CM | POA: Diagnosis not present

## 2014-07-28 DIAGNOSIS — K219 Gastro-esophageal reflux disease without esophagitis: Secondary | ICD-10-CM

## 2014-07-28 DIAGNOSIS — I10 Essential (primary) hypertension: Secondary | ICD-10-CM

## 2014-07-28 DIAGNOSIS — S79911A Unspecified injury of right hip, initial encounter: Secondary | ICD-10-CM | POA: Diagnosis not present

## 2014-07-28 DIAGNOSIS — I482 Chronic atrial fibrillation, unspecified: Secondary | ICD-10-CM

## 2014-07-28 DIAGNOSIS — J449 Chronic obstructive pulmonary disease, unspecified: Secondary | ICD-10-CM | POA: Diagnosis not present

## 2014-07-28 DIAGNOSIS — I130 Hypertensive heart and chronic kidney disease with heart failure and stage 1 through stage 4 chronic kidney disease, or unspecified chronic kidney disease: Secondary | ICD-10-CM

## 2014-07-28 DIAGNOSIS — Z1159 Encounter for screening for other viral diseases: Secondary | ICD-10-CM

## 2014-07-28 DIAGNOSIS — S4991XA Unspecified injury of right shoulder and upper arm, initial encounter: Secondary | ICD-10-CM | POA: Diagnosis not present

## 2014-07-28 DIAGNOSIS — F329 Major depressive disorder, single episode, unspecified: Secondary | ICD-10-CM | POA: Diagnosis not present

## 2014-07-28 DIAGNOSIS — M25551 Pain in right hip: Secondary | ICD-10-CM

## 2014-07-28 DIAGNOSIS — E870 Hyperosmolality and hypernatremia: Secondary | ICD-10-CM

## 2014-07-28 DIAGNOSIS — Z7901 Long term (current) use of anticoagulants: Secondary | ICD-10-CM

## 2014-07-28 DIAGNOSIS — I5042 Chronic combined systolic (congestive) and diastolic (congestive) heart failure: Secondary | ICD-10-CM

## 2014-07-28 DIAGNOSIS — M25511 Pain in right shoulder: Secondary | ICD-10-CM | POA: Insufficient documentation

## 2014-07-28 DIAGNOSIS — M25552 Pain in left hip: Secondary | ICD-10-CM

## 2014-07-28 DIAGNOSIS — F32A Depression, unspecified: Secondary | ICD-10-CM

## 2014-07-28 DIAGNOSIS — R636 Underweight: Secondary | ICD-10-CM | POA: Diagnosis not present

## 2014-07-28 DIAGNOSIS — Z125 Encounter for screening for malignant neoplasm of prostate: Secondary | ICD-10-CM

## 2014-07-28 DIAGNOSIS — R21 Rash and other nonspecific skin eruption: Secondary | ICD-10-CM | POA: Diagnosis not present

## 2014-07-28 DIAGNOSIS — R634 Abnormal weight loss: Secondary | ICD-10-CM | POA: Diagnosis not present

## 2014-07-28 DIAGNOSIS — Z96643 Presence of artificial hip joint, bilateral: Secondary | ICD-10-CM | POA: Insufficient documentation

## 2014-07-28 DIAGNOSIS — I509 Heart failure, unspecified: Secondary | ICD-10-CM | POA: Diagnosis not present

## 2014-07-28 LAB — CBC WITH DIFFERENTIAL/PLATELET
BASOS ABS: 0 10*3/uL (ref 0.0–0.1)
Basophils Relative: 0 % (ref 0–1)
EOS ABS: 0.3 10*3/uL (ref 0.0–0.7)
Eosinophils Relative: 4 % (ref 0–5)
HEMATOCRIT: 48.4 % (ref 39.0–52.0)
HEMOGLOBIN: 16 g/dL (ref 13.0–17.0)
LYMPHS ABS: 2.2 10*3/uL (ref 0.7–4.0)
Lymphocytes Relative: 27 % (ref 12–46)
MCH: 30.1 pg (ref 26.0–34.0)
MCHC: 33.1 g/dL (ref 30.0–36.0)
MCV: 91.1 fL (ref 78.0–100.0)
MONO ABS: 0.5 10*3/uL (ref 0.1–1.0)
MONOS PCT: 6 % (ref 3–12)
MPV: 9.9 fL (ref 8.6–12.4)
Neutro Abs: 5.1 10*3/uL (ref 1.7–7.7)
Neutrophils Relative %: 63 % (ref 43–77)
PLATELETS: 167 10*3/uL (ref 150–400)
RBC: 5.31 MIL/uL (ref 4.22–5.81)
RDW: 16.8 % — ABNORMAL HIGH (ref 11.5–15.5)
WBC: 8.1 10*3/uL (ref 4.0–10.5)

## 2014-07-28 LAB — COMPLETE METABOLIC PANEL WITH GFR
ALK PHOS: 70 U/L (ref 39–117)
ALT: 14 U/L (ref 0–53)
AST: 17 U/L (ref 0–37)
Albumin: 4.3 g/dL (ref 3.5–5.2)
BILIRUBIN TOTAL: 0.9 mg/dL (ref 0.2–1.2)
BUN: 24 mg/dL — ABNORMAL HIGH (ref 6–23)
CALCIUM: 9.5 mg/dL (ref 8.4–10.5)
CHLORIDE: 114 meq/L — AB (ref 96–112)
CO2: 19 mEq/L (ref 19–32)
Creat: 1.61 mg/dL — ABNORMAL HIGH (ref 0.50–1.35)
GFR, EST NON AFRICAN AMERICAN: 43 mL/min — AB
GFR, Est African American: 49 mL/min — ABNORMAL LOW
Glucose, Bld: 78 mg/dL (ref 70–99)
Potassium: 5.1 mEq/L (ref 3.5–5.3)
SODIUM: 147 meq/L — AB (ref 135–145)
TOTAL PROTEIN: 7 g/dL (ref 6.0–8.3)

## 2014-07-28 LAB — LIPID PANEL
CHOL/HDL RATIO: 2.5 ratio
Cholesterol: 104 mg/dL (ref 0–200)
HDL: 41 mg/dL (ref >=40)
LDL Cholesterol: 51 mg/dL (ref 0–99)
TRIGLYCERIDES: 58 mg/dL (ref <150)
VLDL: 12 mg/dL (ref 0–40)

## 2014-07-28 LAB — POCT INR: INR: 1.2

## 2014-07-28 MED ORDER — OXYCODONE-ACETAMINOPHEN 10-325 MG PO TABS: 1.0000 | ORAL_TABLET | ORAL | Status: DC | PRN

## 2014-07-28 MED ORDER — TRIAMCINOLONE ACETONIDE 0.1 % EX CREA: 1.0000 "application " | TOPICAL_CREAM | Freq: Two times a day (BID) | CUTANEOUS | Status: DC

## 2014-07-28 MED ORDER — TIOTROPIUM BROMIDE MONOHYDRATE 18 MCG IN CAPS
18.0000 ug | ORAL_CAPSULE | Freq: Every day | RESPIRATORY_TRACT | Status: DC
Start: 2014-07-28 — End: 2015-01-04

## 2014-07-28 MED ORDER — OMEPRAZOLE 20 MG PO CPDR: 40.0000 mg | DELAYED_RELEASE_CAPSULE | Freq: Every day | ORAL | Status: DC

## 2014-07-28 MED ORDER — ALBUTEROL SULFATE HFA 108 (90 BASE) MCG/ACT IN AERS: 2.0000 | INHALATION_SPRAY | Freq: Four times a day (QID) | RESPIRATORY_TRACT | Status: DC | PRN

## 2014-07-28 NOTE — Assessment & Plan Note (Addendum)
SpO2 Readings from Last 3 Encounters:  07/28/14 100%  06/22/14 100%  06/01/14 100%    Assessment: Patient Is doing well on Spiriva and PRN albuterol.  Plan: Continue Spiriva and PRN albuterol.

## 2014-07-28 NOTE — Assessment & Plan Note (Signed)
Dg Shoulder Right  07/28/2014   CLINICAL DATA:  [place] yesterday.  Pain on right side.  EXAM: RIGHT SHOULDER - 2+ VIEW  COMPARISON:  Chest radiograph 05/27/2013  FINDINGS: Glenohumeral joint is intact. No evidence of scapular fracture or humeral fracture. The acromioclavicular joint is intact.  IMPRESSION: No fracture or dislocation.   Electronically Signed   By: Suzy Bouchard M.D.   On: 07/28/2014 13:57   Dg Hip Unilat With Pelvis 2-3 Views Right  07/28/2014   CLINICAL DATA:  Fall, right-sided pain  EXAM: RIGHT HIP (WITH PELVIS) 2-3 VIEWS  COMPARISON:  Plain films 08/13/2013  FINDINGS: Bilateral total hip arthroplasty. The acetabular components are in the same orientation as comparison exam. No fracture or dislocation. There is exuberant heterotopic ossification superior to the left and right greater trochanters but greater on the right. This is not changed from prior.  IMPRESSION: 1. No fracture dislocation. 2. Bilateral hip arthroplasties. 3. Extensive heterotopic ossification unchanged   Electronically Signed   By: Suzy Bouchard M.D.   On: 07/28/2014 14:00     Assessment: Patient is status post bilateral hip arthroplasty  and has chronic bilateral hip pain.  Today has right hip pain is worse following a fall yesterday evening.  X-rays today show no acute abnormality.  He reports adequate pain control and improved function on oxycodone-acetaminophen 10-325 mg 1 tablet every 4 hours as needed for pain (not to exceed 5 tablets in 24 hours).  I revised his controlled medication contract today to reflect #150 tablets per 30 days, and I reviewed the contract with patient who signed.  Plan: Continue oxycodone-acetaminophen 10-325 milligrams 1 tablet every 4 hours as needed for pain (not to exceed 5 tablets in 24 hours).  I provided a written prescription today for #150 as per his medication contract.

## 2014-07-28 NOTE — Progress Notes (Signed)
   Subjective:    Patient ID: [name], male    DOB: 12/05/1945, 69 y.o.   MRN: [id number]  HPI Patient returns for follow-up of his weight loss, hypertension, congestive heart failure, depression, and other chronic medical problems.  He also has an acute complaint of right shoulder and right hip pain following a fall yesterday evening; he reports that he tripped and fell, and denies any loss of consciousness or neurologic deficits.  He has chronic bilateral hip pain and back pain, for which he takes oxycodone 10 mg/acetaminophen 325 mg 1 tablet every 4 hours as needed for pain, not to exceed 5 tablets in a 24-hour period.  He has been using the 5 tablets per day on a regular basis in order to control his chronic pain.  He has chronic loss of appetite, and reports further decline in his weight; he has only been using his Boost nutritional supplements about once every other day.  He has not been taking his warfarin on a regular basis.  He feels that his depression is well controlled on his current dose of Wellbutrin.   Review of Systems  Constitutional: Negative for fever, chills and diaphoresis.  Respiratory: Positive for shortness of breath (Occasional). Negative for cough and wheezing.   Cardiovascular: Negative for chest pain and leg swelling.  Gastrointestinal: Negative for nausea, vomiting, abdominal pain, blood in stool and anal bleeding.  Genitourinary: Negative for dysuria and frequency.  Musculoskeletal: Positive for back pain (Chronic) and arthralgias (Chronic bilateral hip pain.).  Neurological: Negative for syncope.    I reviewed and updated the medication list, allergies, past medical history, past surgical history, family history, and social history.     Objective:   Physical Exam  Constitutional: No distress.  Cardiovascular: S1 normal and S2 normal.  An irregularly irregular rhythm present. Exam reveals no S3 and no S4.     No systolic murmur is present  No lower  extremity edema  Abdominal: Soft. Bowel sounds are normal. He exhibits no distension. There is no hepatosplenomegaly. There is no tenderness. There is no rebound and no guarding.  Musculoskeletal:       Right shoulder: He exhibits decreased range of motion (Limited by pain) and tenderness. He exhibits no swelling.       Right hip: He exhibits decreased range of motion (Limited by pain).  Skin: Rash noted.           Assessment & Plan:

## 2014-07-28 NOTE — Assessment & Plan Note (Signed)
Dg Shoulder Right  07/28/2014   CLINICAL DATA:  [place] yesterday.  Pain on right side.  EXAM: RIGHT SHOULDER - 2+ VIEW  COMPARISON:  Chest radiograph 05/27/2013  FINDINGS: Glenohumeral joint is intact. No evidence of scapular fracture or humeral fracture. The acromioclavicular joint is intact.  IMPRESSION: No fracture or dislocation.   Electronically Signed   By: Suzy Bouchard M.D.   On: 07/28/2014 13:57   Dg Hip Unilat With Pelvis 2-3 Views Right  07/28/2014   CLINICAL DATA:  Fall, right-sided pain  EXAM: RIGHT HIP (WITH PELVIS) 2-3 VIEWS  COMPARISON:  Plain films 08/13/2013  FINDINGS: Bilateral total hip arthroplasty. The acetabular components are in the same orientation as comparison exam. No fracture or dislocation. There is exuberant heterotopic ossification superior to the left and right greater trochanters but greater on the right. This is not changed from prior.  IMPRESSION: 1. No fracture dislocation. 2. Bilateral hip arthroplasties. 3. Extensive heterotopic ossification unchanged   Electronically Signed   By: Suzy Bouchard M.D.   On: 07/28/2014 14:00    Assessment: Patient presented today with right shoulder pain following a fall yesterday evening.  X-ray shows no fracture or dislocation.  Plan: Symptomatic treatment as per his chronic hip pain.

## 2014-07-28 NOTE — Assessment & Plan Note (Signed)
Lab Results  Component Value Date   CREATININE 1.65* 06/01/2014   CREATININE 1.92* 01/25/2014   CREATININE 1.59* 01/21/2014   CREATININE 1.57* 01/20/2014   CREATININE 1.61* 01/20/2014    Assessment: Patient has stable chronic kidney disease; he is followed by nephrologist Dr. Moshe Cipro.  Plan: Check a comprehensive metabolic panel and CBC with differential today; follow up with Dr. Moshe Cipro.

## 2014-07-28 NOTE — Assessment & Plan Note (Addendum)
Wt Readings from Last 3 Encounters:  07/28/14 153 lb 8 oz (69.627 kg)  06/22/14 162 lb 4.8 oz (73.619 kg)  06/01/14 171 lb 8 oz (77.792 kg)    Assessment: Patient's weight has decreased by about 11 pounds over the past month.  He reports chronically decreased appetite; he has not been using his Boost nutritional supplements regularly, and reports drinking one about every other day.  He says these are expensive and he has difficulty affording them.  Plan: Refer to nutritionist Debera Lat; I advised him to continue his Boost nutritional supplements and discussed with nutritionist whether there are less expensive alternatives.  Patient has had extensive past workup.  Today will check a hepatitis C antibody and PSA, along with other labs including a comprehensive metabolic panel, CBC with differential, and lipid panel.  If his weight does not improve with nutritional measures, could consider another trial of an appetite stimulant such as Marinol, although potential side effects would have to be weighed against potential benefit.

## 2014-07-28 NOTE — Assessment & Plan Note (Signed)
Assessment: Symptoms are controlled on omeprazole 40 mg daily.  Plan: Continue omeprazole 40 mg daily.

## 2014-07-28 NOTE — Assessment & Plan Note (Signed)
BP Readings from Last 3 Encounters:  07/28/14 115/88  06/22/14 104/73  06/01/14 101/74    Lab Results  Component Value Date   NA 143 06/01/2014   K 4.0 06/01/2014   CREATININE 1.65* 06/01/2014    Assessment: Blood pressure control: controlled Progress toward BP goal:  at goal Comments: Blood pressure is controlled on diltiazem CD 240 mg twice a day and metoprolol 25 mg twice a day.  Plan: Medications:  continue current medications

## 2014-07-28 NOTE — Assessment & Plan Note (Signed)
.   Lab Results  Component Value Date   INR 1.2 07/28/2014    Assessment: INR is subtherapeutic; patient reports that he has not been taking his warfarin for the past few days.  Plan: Patient was advised to refill and resume his warfarin.  Dr. Elie Confer saw patient and printed out his recommended dosing,  and scheduled a follow-up appointment for 08/15/2014.

## 2014-07-28 NOTE — Assessment & Plan Note (Addendum)
Pulse Readings from Last 3 Encounters:  07/28/14 87  06/22/14 101  06/01/14 70    Assessment: Patient's  rate appears to be adequately controlled on diltiazem CD 240 mg twice a day and metoprolol 25 mg twice a day.  He is followed by cardiologist Dr. Rayann Heman.    Plan: Continue current medications; follow-up with Dr. Rayann Heman.

## 2014-07-28 NOTE — Assessment & Plan Note (Signed)
Assessment: Patient has a recurrent rash on his chest, with a patch on the left lower chest anteriorly and a patch on the right upper chest posteriorly.  He was referred to dermatology in April of this year but apparently left the dermatology office because of the required co-pay.  He reports using the hydrocortisone 2.5% cream which I prescribed in January, but the rash has persisted.  Plan: Stop hydrocortisone 2.5% cream and treat with triamcinolone 0.1% cream topical twice a day for 2 weeks.  I advised patient to call or return if the rash does not resolve.

## 2014-07-28 NOTE — Assessment & Plan Note (Signed)
Assessment: Patient is doing well with no active symptoms on bupropion 75 mg twice a day.  Plan: Continue bupropion 75 mg twice a day.

## 2014-07-28 NOTE — Assessment & Plan Note (Signed)
Wt Readings from Last 3 Encounters:  07/28/14 153 lb 8 oz (69.627 kg)  06/22/14 162 lb 4.8 oz (73.619 kg)  06/01/14 171 lb 8 oz (77.792 kg)    Assessment: Patient's weight has decreased by about 11 pounds over the past month.  He reports chronically decreased appetite; he has not been using his Boost nutritional supplements regularly, and reports drinking one about every other day.  He says these are expensive and he has difficulty affording them.  Plan: Refer to nutritionist Debera Lat; I advised him to continue his Boost nutritional supplements and discussed with nutritionist whether there are less expensive alternatives.  Patient has had extensive past workup.  Today will check a hepatitis C antibody and PSA, along with other labs including a comprehensive metabolic panel, CBC with differential, and lipid panel.  If his weight does not improve with nutritional measures, could consider another trial of an appetite stimulant such as Marinol, although potential side effects would have to be weighed against potential benefit.

## 2014-07-28 NOTE — Patient Instructions (Addendum)
Please schedule an appointment with nutritionist Debera Lat for management of weight loss. Return on Monday, March 28 to see Dr. Elie Confer for follow-up of your warfarin dosing. Please eat regular meals and nutritional supplements 3 times a day.

## 2014-07-28 NOTE — Assessment & Plan Note (Signed)
Wt Readings from Last 3 Encounters:  07/28/14 153 lb 8 oz (69.627 kg)  06/22/14 162 lb 4.8 oz (73.619 kg)  06/01/14 171 lb 8 oz (77.792 kg)    Assessment: Patient is doing well without signs or symptoms of decompensation on his current regimen of diltiazem CD 240 mg twice a day and metoprolol 25 mg twice a day.  Plan: Continue current medications.

## 2014-07-29 LAB — PSA: PSA: 0.49 ng/mL (ref <=4.00)

## 2014-07-29 LAB — HEPATITIS C ANTIBODY: HCV AB: NEGATIVE

## 2014-07-29 NOTE — Progress Notes (Signed)
Quick Note:  Patient has mild hypernatremia; I called him and discussed this with him. He reports that he has been drinking less water than usual, and that he normally drinks about 3 bottles of water per day. I advised him to increase to his baseline water intake. Plan is to recheck electrolytes in 2 weeks. ______

## 2014-07-29 NOTE — Progress Notes (Signed)
Addendum: Since patient's controlled medication contract was revised yesterday and a new prescription for oxycodone/acetaminophen was printed and signed, the 3 prescriptions that I signed on 07/26/2014 were destroyed by the refill nurse.

## 2014-08-05 ENCOUNTER — Encounter: Payer: Self-pay | Admitting: *Deleted

## 2014-08-08 ENCOUNTER — Encounter: Payer: Self-pay | Admitting: Vascular Surgery

## 2014-08-09 ENCOUNTER — Ambulatory Visit: Payer: Commercial Managed Care - HMO | Admitting: Vascular Surgery

## 2014-08-09 ENCOUNTER — Encounter (HOSPITAL_COMMUNITY): Payer: Commercial Managed Care - HMO

## 2014-08-09 IMAGING — CR DG CHEST 2V
2 series · 2 of 2 positions shown · non-contrast
Comparison: 03/17/2012

CLINICAL DATA: Dizziness and shortness of breath.

EXAM:
CHEST  2 VIEW

[w chest pa]
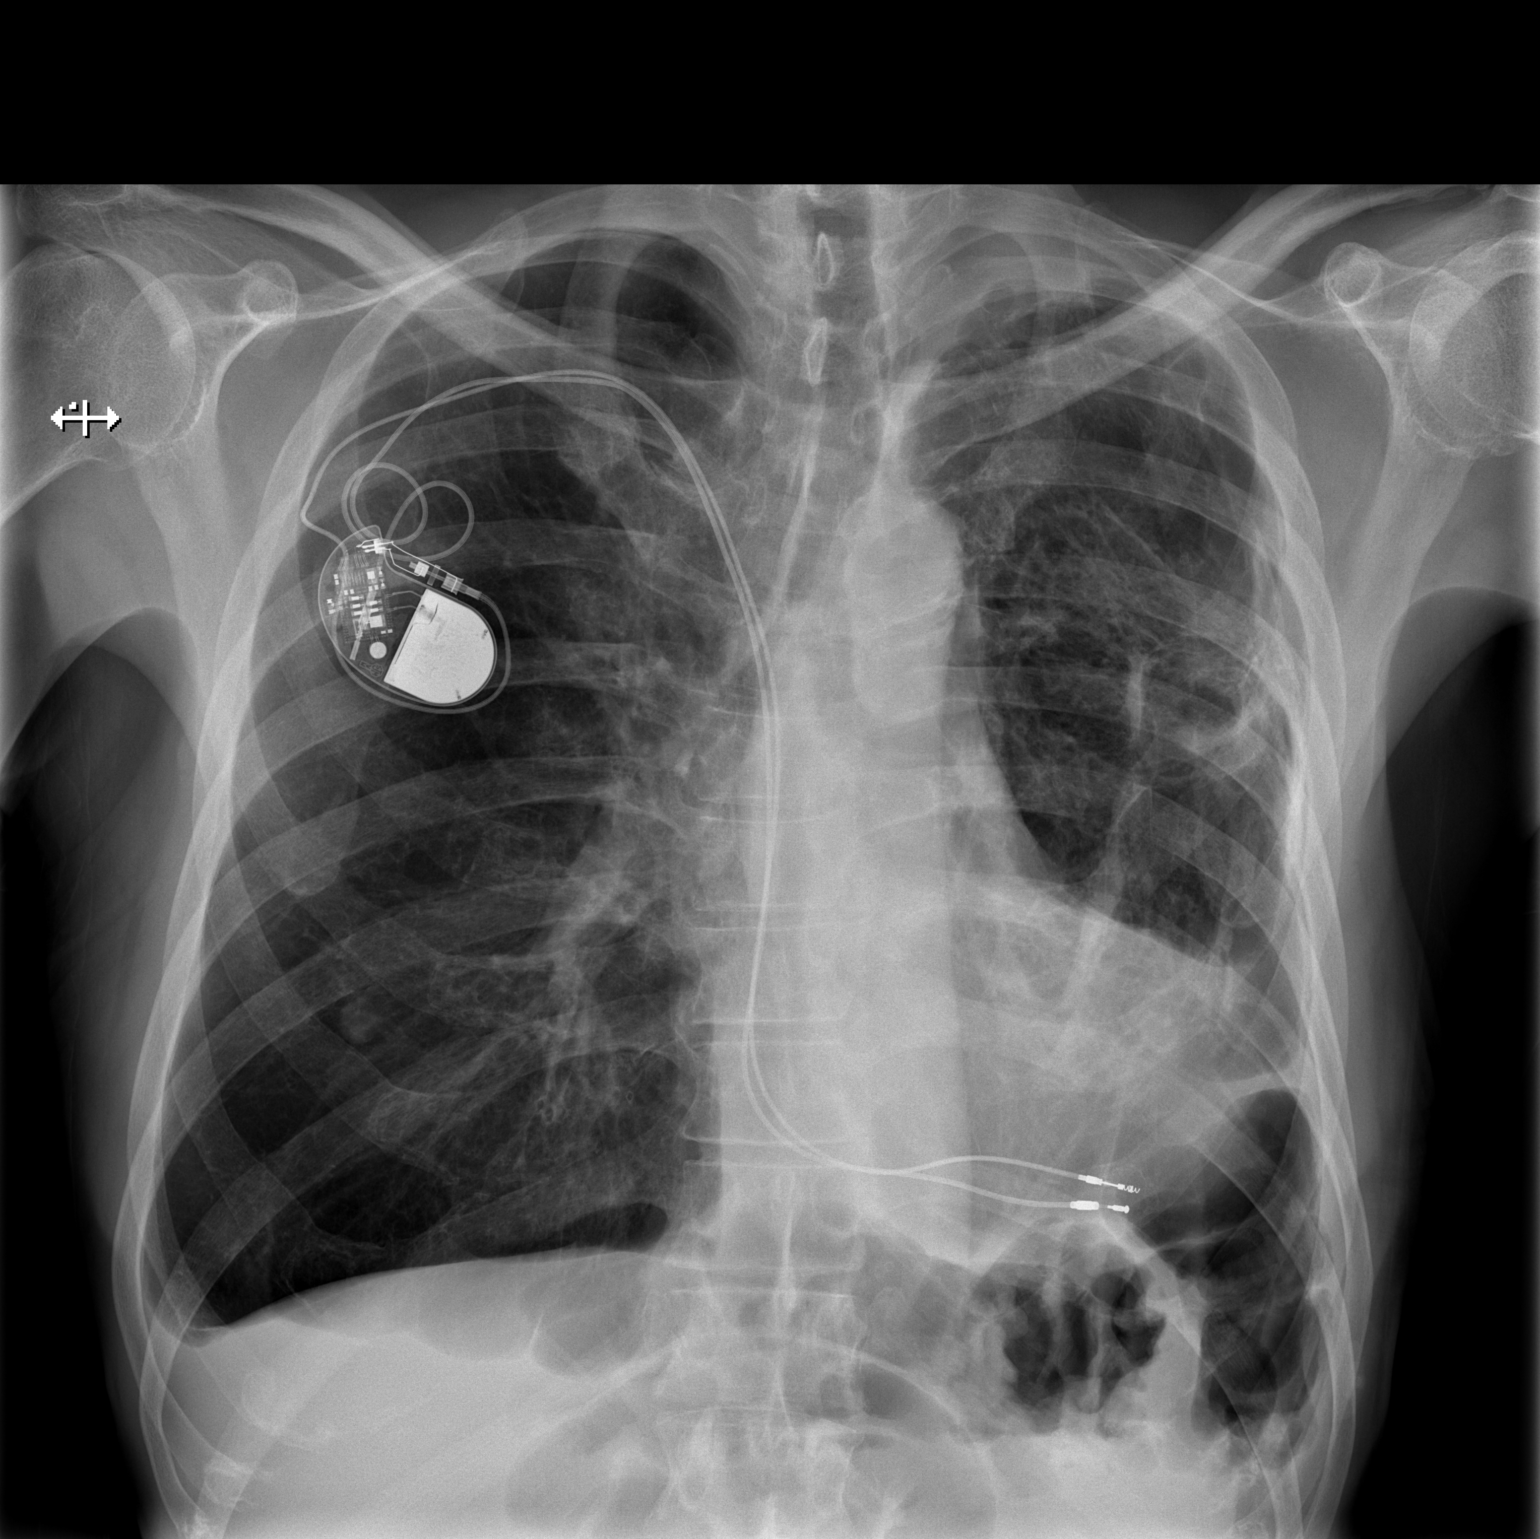

[w chest lat]
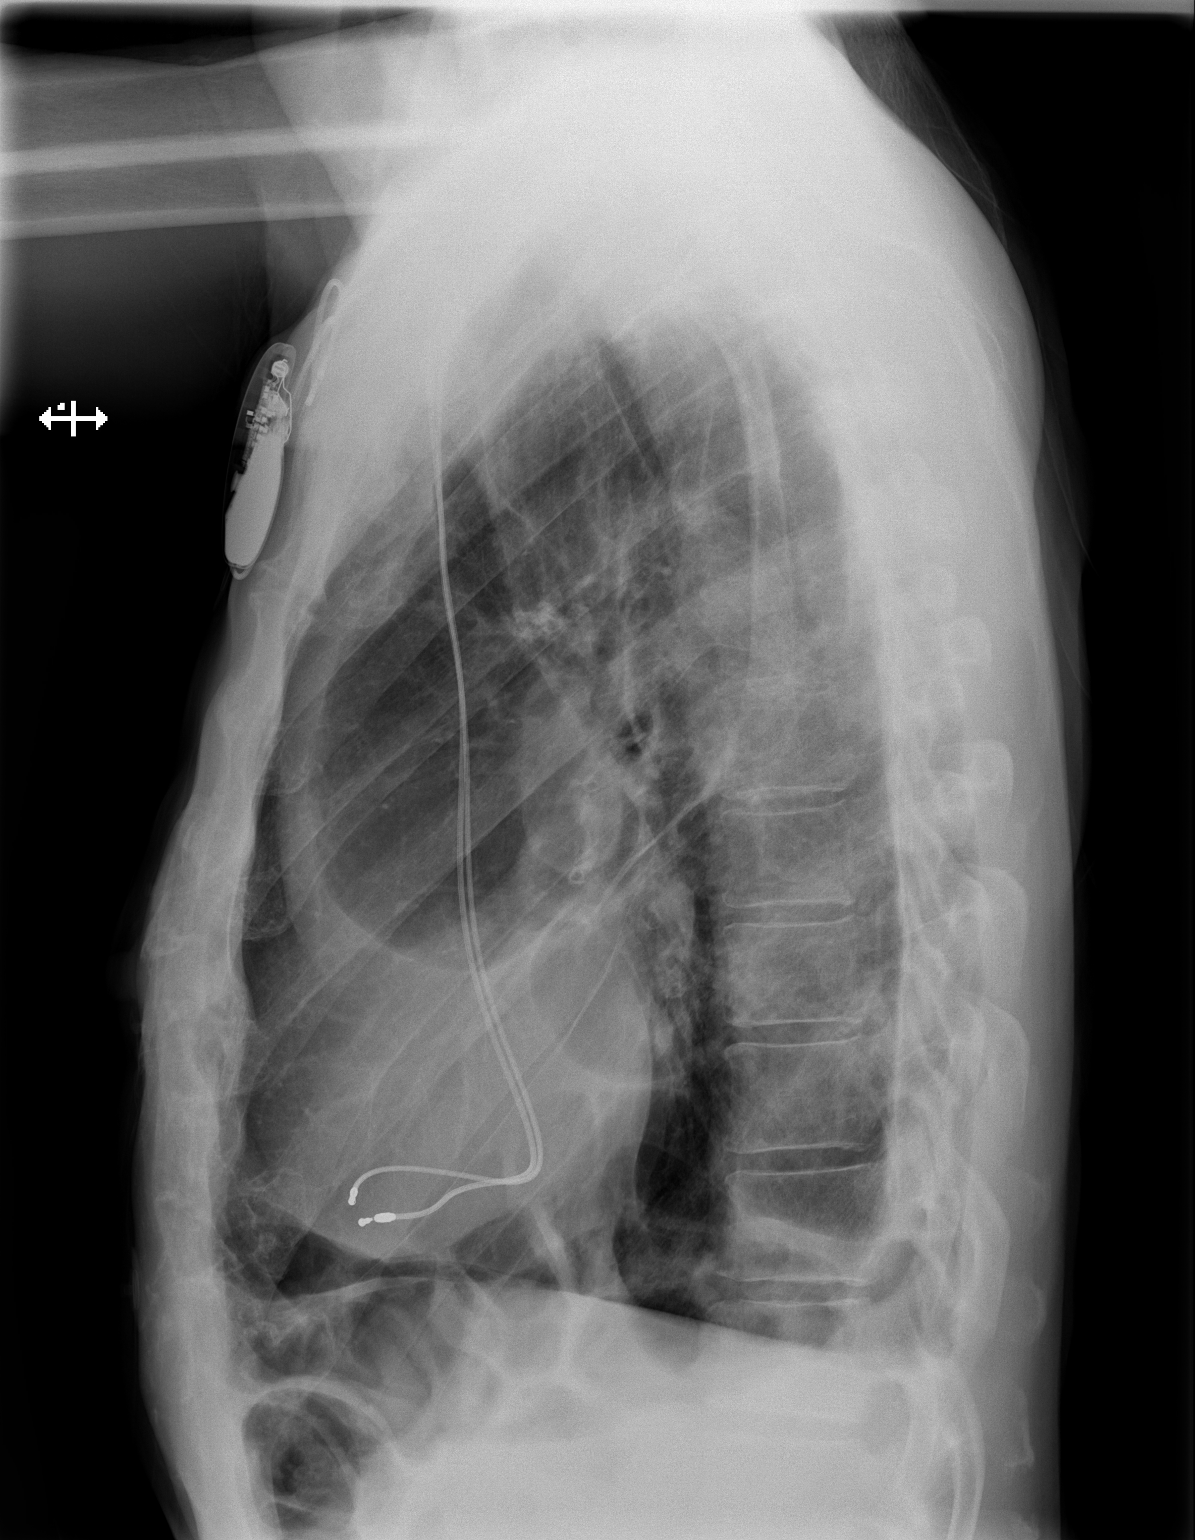

[2 of 2 positions shown; findings below may reference images not displayed]

FINDINGS: Dual lead pacemaker remains in place. The right lung again shows
chronic hyperinflation. Nipple shadow present on the right. The left
lung shows chronic pleural and parenchymal scarring, not
significantly changed. No evidence of active pneumonia, heart
failure, effusion or collapse.
IMPRESSION: Chronic lung disease with hyperinflation on the right and extensive
pleural parenchymal scarring on the left. No change compared
previous studies.

## 2014-08-10 ENCOUNTER — Ambulatory Visit (INDEPENDENT_AMBULATORY_CARE_PROVIDER_SITE_OTHER): Payer: Commercial Managed Care - HMO | Admitting: Internal Medicine

## 2014-08-10 ENCOUNTER — Encounter: Payer: Self-pay | Admitting: Internal Medicine

## 2014-08-10 VITALS — BP 147/80 | HR 57 | Temp 97.7°F | Ht 75.0 in | Wt 154.1 lb

## 2014-08-10 DIAGNOSIS — I10 Essential (primary) hypertension: Secondary | ICD-10-CM

## 2014-08-10 DIAGNOSIS — E870 Hyperosmolality and hypernatremia: Secondary | ICD-10-CM | POA: Insufficient documentation

## 2014-08-10 DIAGNOSIS — R21 Rash and other nonspecific skin eruption: Secondary | ICD-10-CM

## 2014-08-10 LAB — BASIC METABOLIC PANEL WITH GFR
BUN: 16 mg/dL (ref 6–23)
CHLORIDE: 109 meq/L (ref 96–112)
CO2: 24 meq/L (ref 19–32)
Calcium: 9.5 mg/dL (ref 8.4–10.5)
Creat: 1.33 mg/dL (ref 0.50–1.35)
GFR, Est African American: 62 mL/min
GFR, Est Non African American: 54 mL/min — ABNORMAL LOW
GLUCOSE: 105 mg/dL — AB (ref 70–99)
Potassium: 4.8 mEq/L (ref 3.5–5.3)
SODIUM: 144 meq/L (ref 135–145)

## 2014-08-10 MED ORDER — BETAMETHASONE DIPROPIONATE 0.05 % EX CREA: TOPICAL_CREAM | CUTANEOUS | Status: DC

## 2014-08-10 NOTE — Assessment & Plan Note (Signed)
BP Readings from Last 3 Encounters:  08/10/14 147/80  07/28/14 115/88  06/22/14 104/73    Lab Results  Component Value Date   NA 147* 07/28/2014   K 5.1 07/28/2014   CREATININE 1.61* 07/28/2014    Assessment: Blood pressure control: controlled Progress toward BP goal:  at goal Comments: Patient has acceptable control on diltiazem CD 240 mg twice a day and metoprolol 25 mg twice a day.  Plan: Medications:  continue current medications

## 2014-08-10 NOTE — Assessment & Plan Note (Signed)
Lab Results  Component Value Date   NA 147* 07/28/2014   NA 143 06/01/2014   NA 141 01/25/2014    Assessment: Sodium was mildly elevated on 07/28/2014, likely due to mild dehydration.  Patient then reported less fluid intake than usual.  Since then, he has resumed his normal oral intake of fluids.  Plan: Check a metabolic panel today.

## 2014-08-10 NOTE — Patient Instructions (Signed)
Stop triamcinolone cream. Start betamethasone 0.05% cream - apply to affected areas on back and arm once daily for 2 weeks.  Do not use on face or in skin folds. Please schedule an appointment with nutritionist [facility].

## 2014-08-10 NOTE — Progress Notes (Signed)
   Subjective:    Patient ID: [name], male    DOB: 04-23-1946, 69 y.o.   MRN: [id number]  HPI Patient returns for follow-up of his mild hypernatremia thought to be due to dehydration, and for follow-up of his skin rash.  On his recent visit here 07/28/2014, his sodium was elevated at 147.  I advised him to increase his fluid intake, since he reported then that he had been drinking less water than usual.  I also prescribed topical triamcinolone 0.1% cream for patches of skin rash on his torso.  Today he has no acute complaints; he reports some improvement in the rash with the topical triamcinolone, but it remains present.  The rash is pruritic, and he has a new patch on his left arm anteriorly.    Review of Systems  Constitutional: Negative for fever, chills and diaphoresis.  Gastrointestinal: Negative for nausea, vomiting, abdominal pain and diarrhea.  Skin: Positive for rash.       Objective:   Physical Exam  Constitutional: No distress.  Cardiovascular: Normal rate, regular rhythm and normal heart sounds.  Exam reveals no gallop and no friction rub.   No murmur heard. Pulmonary/Chest: Effort normal and breath sounds normal. No respiratory distress. He has no wheezes. He has no rales.  Abdominal: Soft. Bowel sounds are normal. He exhibits no distension. There is no tenderness. There is no rebound and no guarding.  Skin:           Assessment & Plan:

## 2014-08-10 NOTE — Assessment & Plan Note (Signed)
Assessment: Patient has a recurrent rash on his chest, with a patch on the left lower chest anteriorly and a patch on the right upper chest posteriorly, and a new small patch on his left anterior arm.  The patches of rash appear eczematous, and patient reports some improvement on triamcinolone 0.1% cream.  He was referred to dermatology in April of last year but says he could not afford the required co-pay, and although I advised dermatology referral today, he declined because he still cannot afford the co-pay.  Plan: Stop triamcinolone cream; start betamethasone 0.05% cream topically to affected areas on back and arm once daily for 2 weeks.  I advised patient not to use the betamethasone cream on his face or in skin folds.

## 2014-08-11 NOTE — Progress Notes (Signed)
Quick Note:  Sodium has returned to normal. ______

## 2014-08-14 IMAGING — CR DG CHEST 2V
2 series · 2 of 2 positions shown · non-contrast
Comparison: 05/17/2013 radiographs.  CT 10/12/2012.

CLINICAL DATA: Chest pain with shortness of breath. History of
pacemaker and congestive heart failure.

EXAM:
CHEST  2 VIEW

[w chest lat]
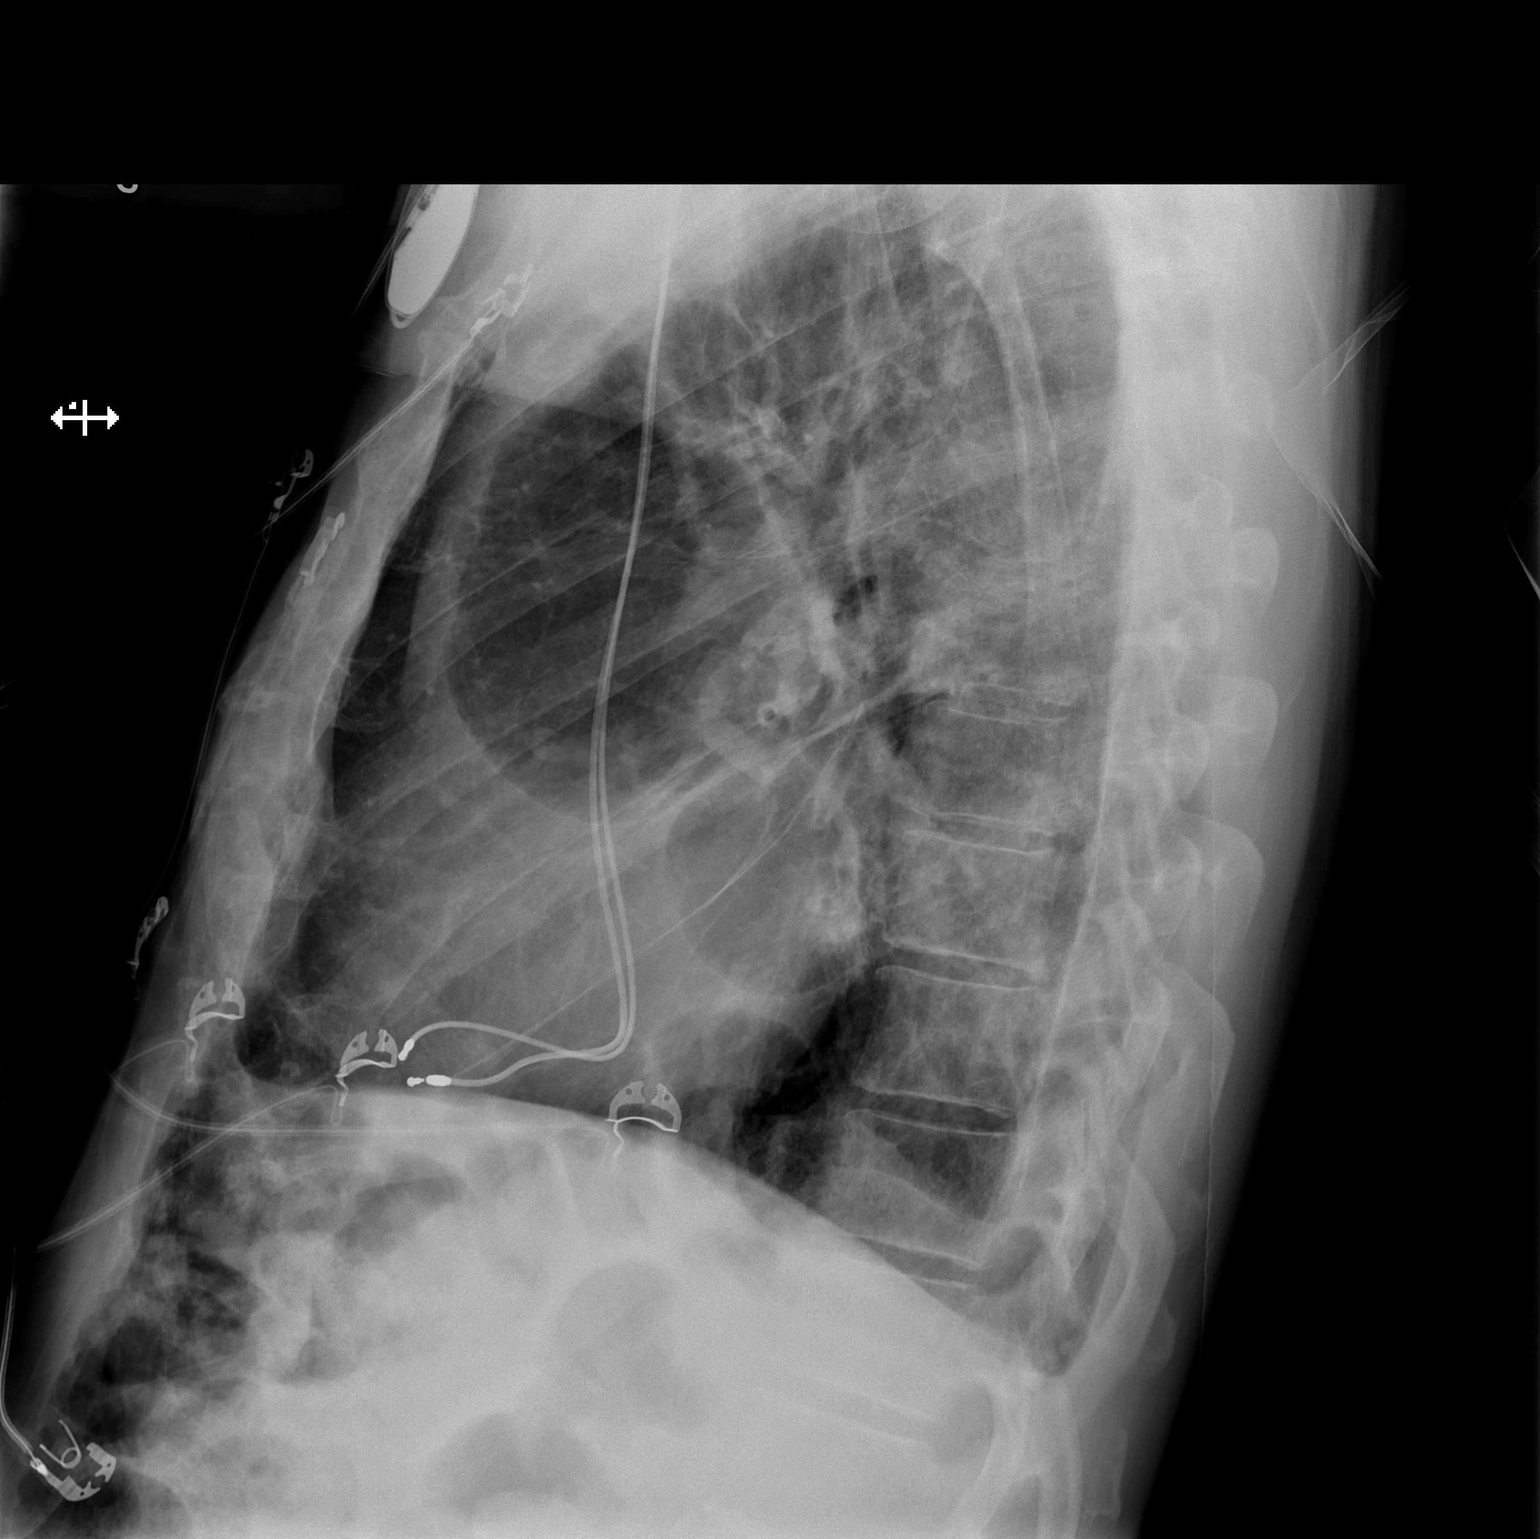

[x chest ap]
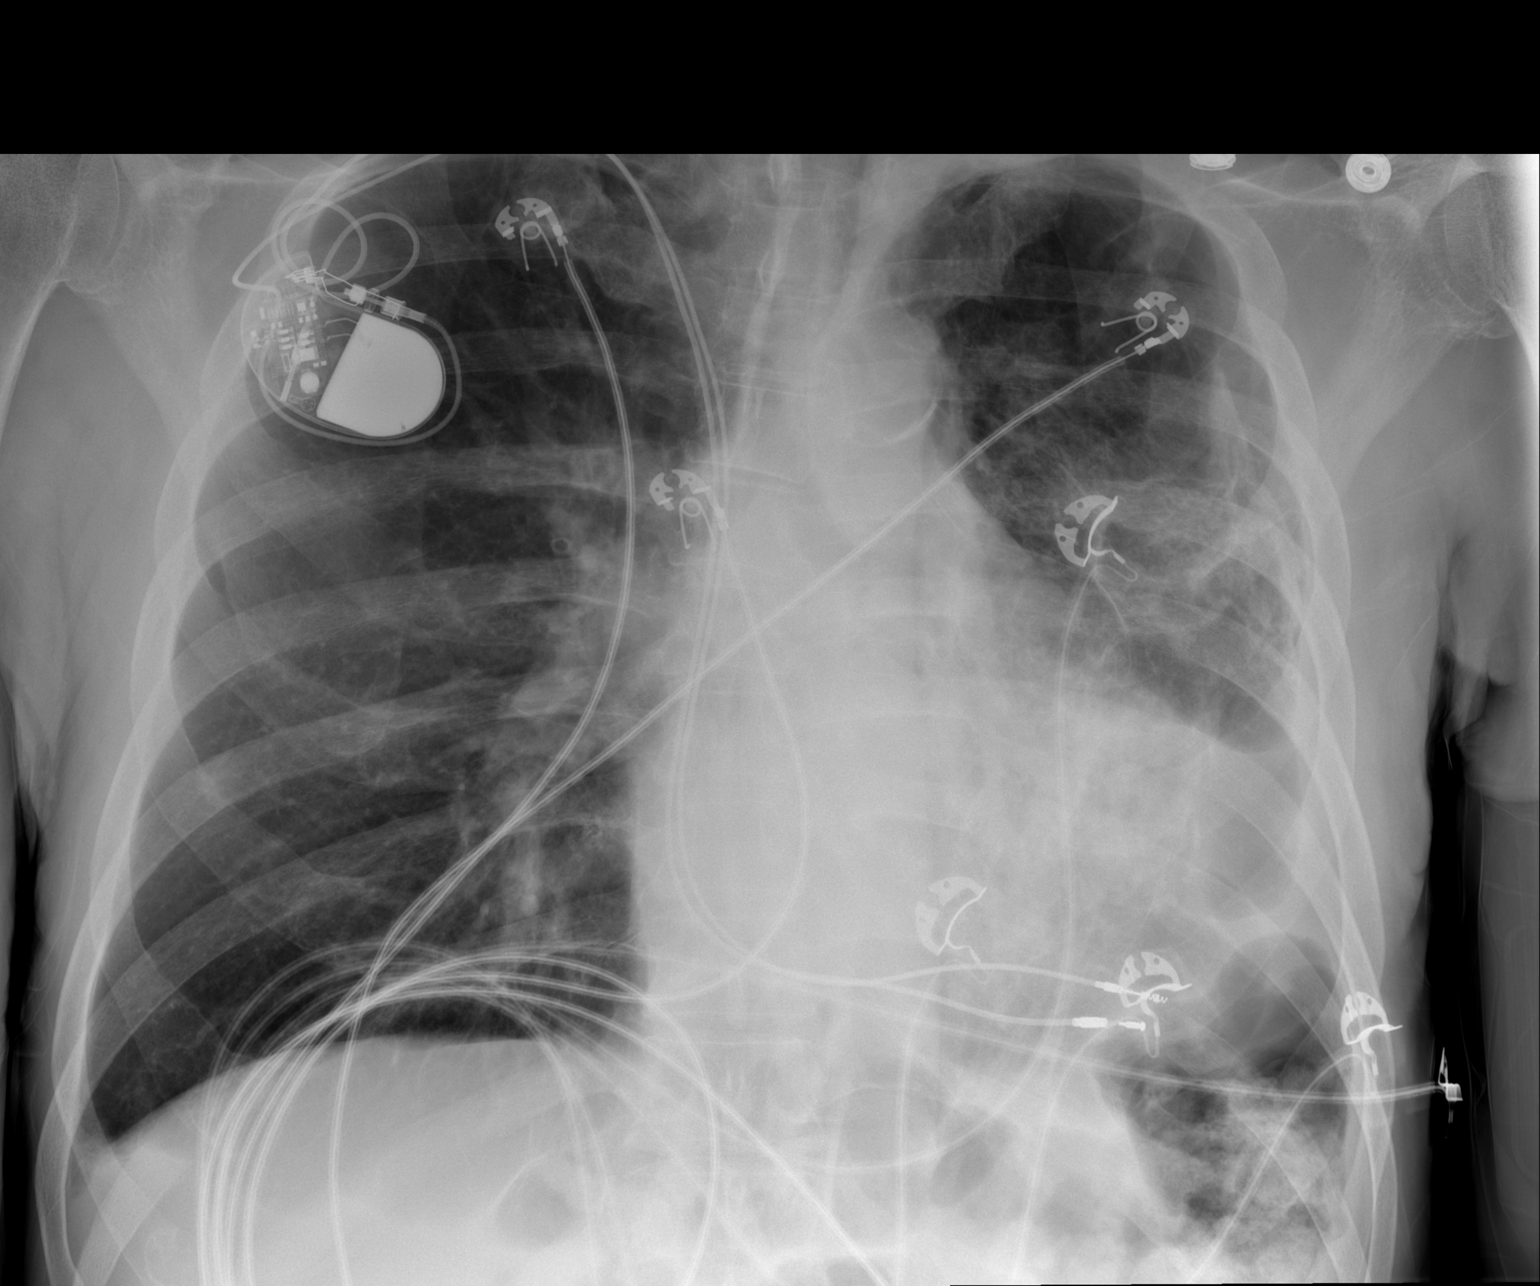

[2 of 2 positions shown; findings below may reference images not displayed]

FINDINGS: The right subclavian pacemaker leads appear unchanged within the
right ventricle. The heart size and mediastinal contours are stable.
There is chronic volume loss in the left hemithorax with parenchymal
scarring, pleural thickening and pleural calcification. Compared
with the prior studies, there is increased density inferiorly
suspicious for superimposed airspace disease. The right lung is
clear.
IMPRESSION: Increased density in the lower left hemithorax compared with prior
studies, suspicious for pneumonia superimposed on chronic pleural
parenchymal scarring. Radiographic follow up recommended.

## 2014-08-16 ENCOUNTER — Other Ambulatory Visit: Payer: Self-pay | Admitting: *Deleted

## 2014-08-16 NOTE — Telephone Encounter (Signed)
Call from pt stating he had talked to [facility] - was told he can refill  Next Oxycodone on 4/2. I called [facility] - who stated 31 days in March, last refilled was 3/10 and he received 25 day supply therefore next refill is 4/2. Pt states he has about 20 pills left of current bottle.

## 2014-08-18 NOTE — Telephone Encounter (Signed)
The instructions for his prescription state "Do not take more than 5 tablets in a 24 hour period"; his medication contract, which I revised and he signed on 3/10, allows #150 tablets for a 30 day period.  He received #150 on 3/10, which is a 30 day supply.  His next refill will be on 08/27/14, not earlier.  I discussed at length with patient on 3/10, when I revised the medication contract, and I informed him that I would not provide early refills.

## 2014-08-19 IMAGING — CR DG CHEST 2V
2 series · 2 of 2 positions shown · non-contrast
Comparison: DG CHEST 2 VIEW dated 05/22/2013; DG CHEST 2 VIEW dated
05/17/2013; CT ABD/PELV WO CM dated 10/12/2012

CLINICAL DATA: Shortness of breath

EXAM:
CHEST  2 VIEW

[w chest pa]
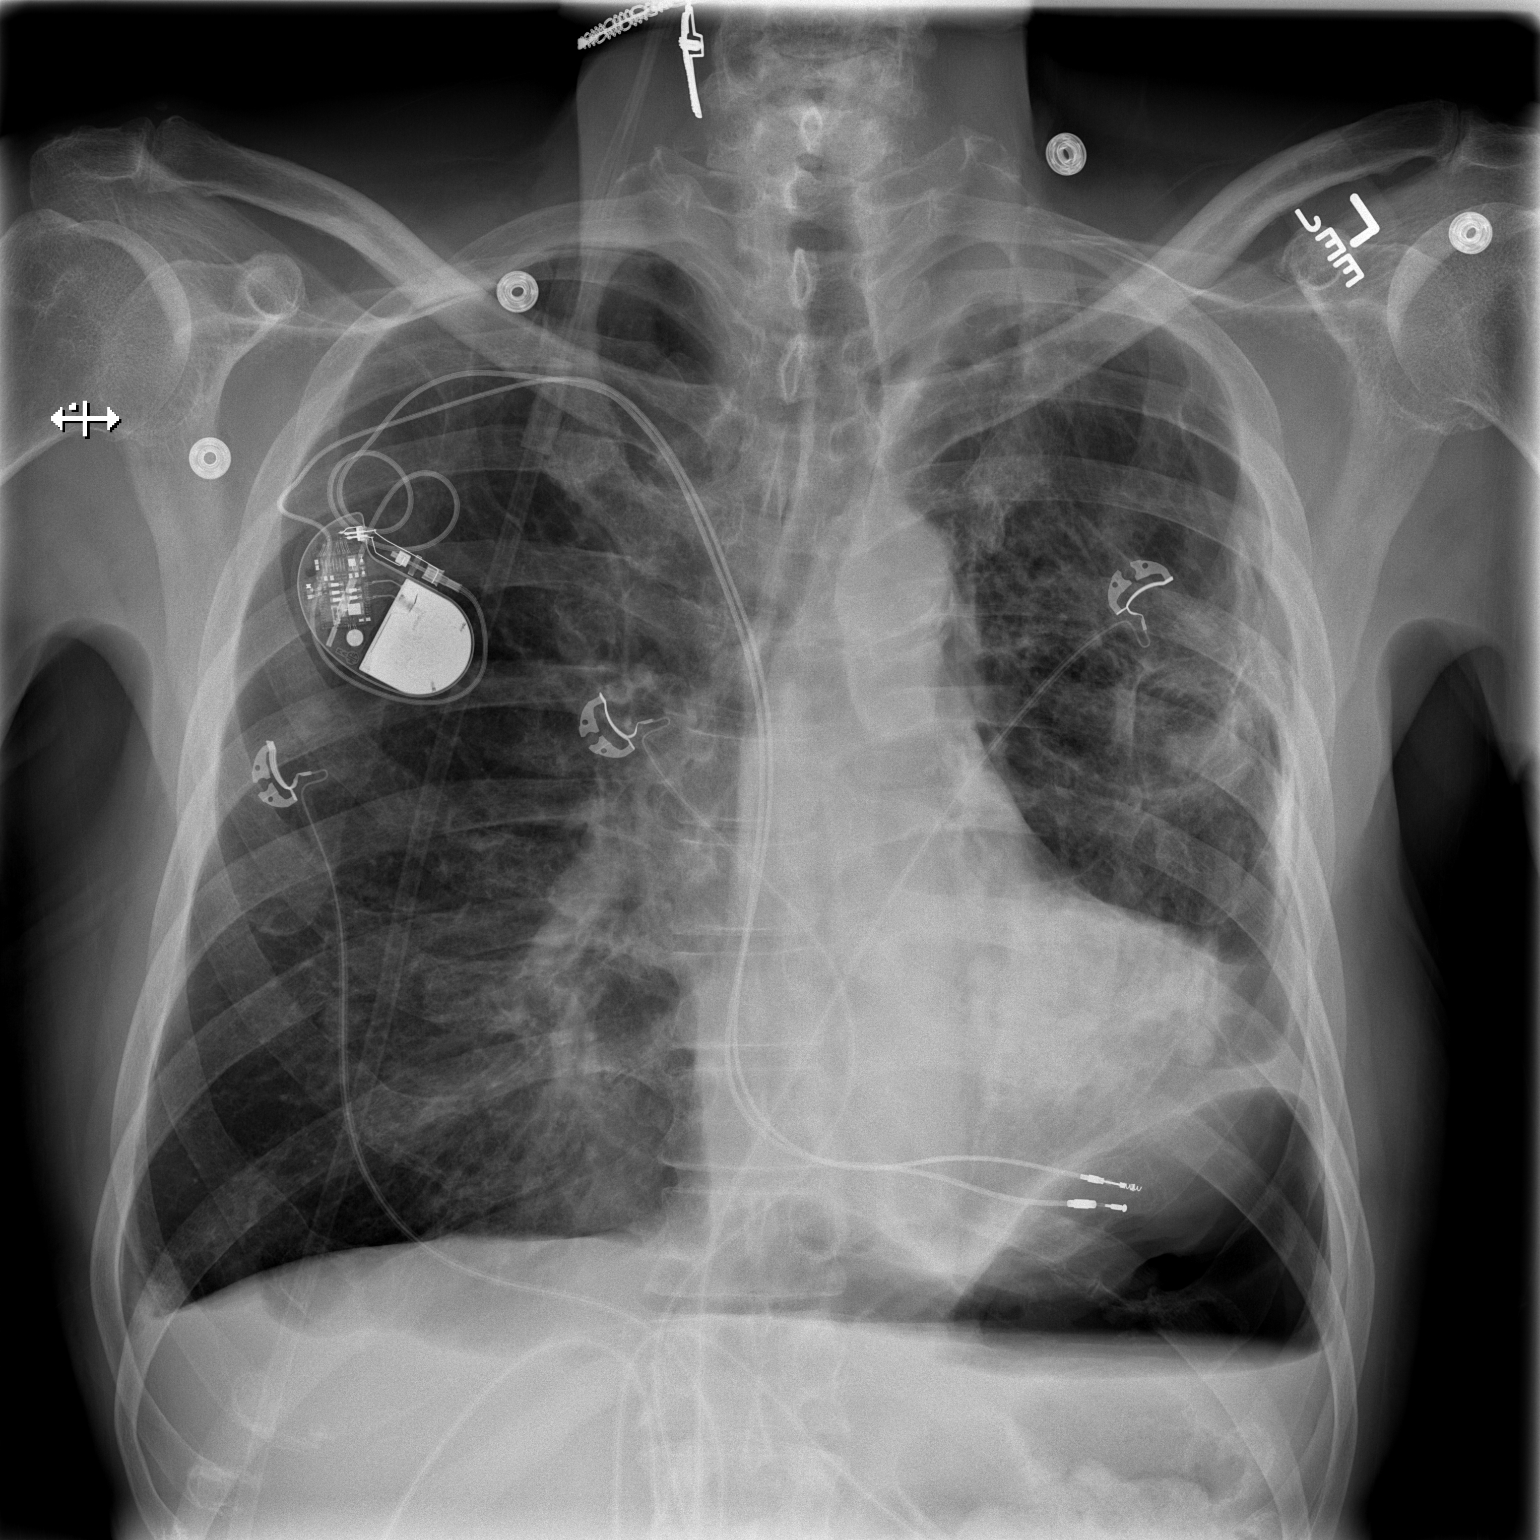

[w chest lat]
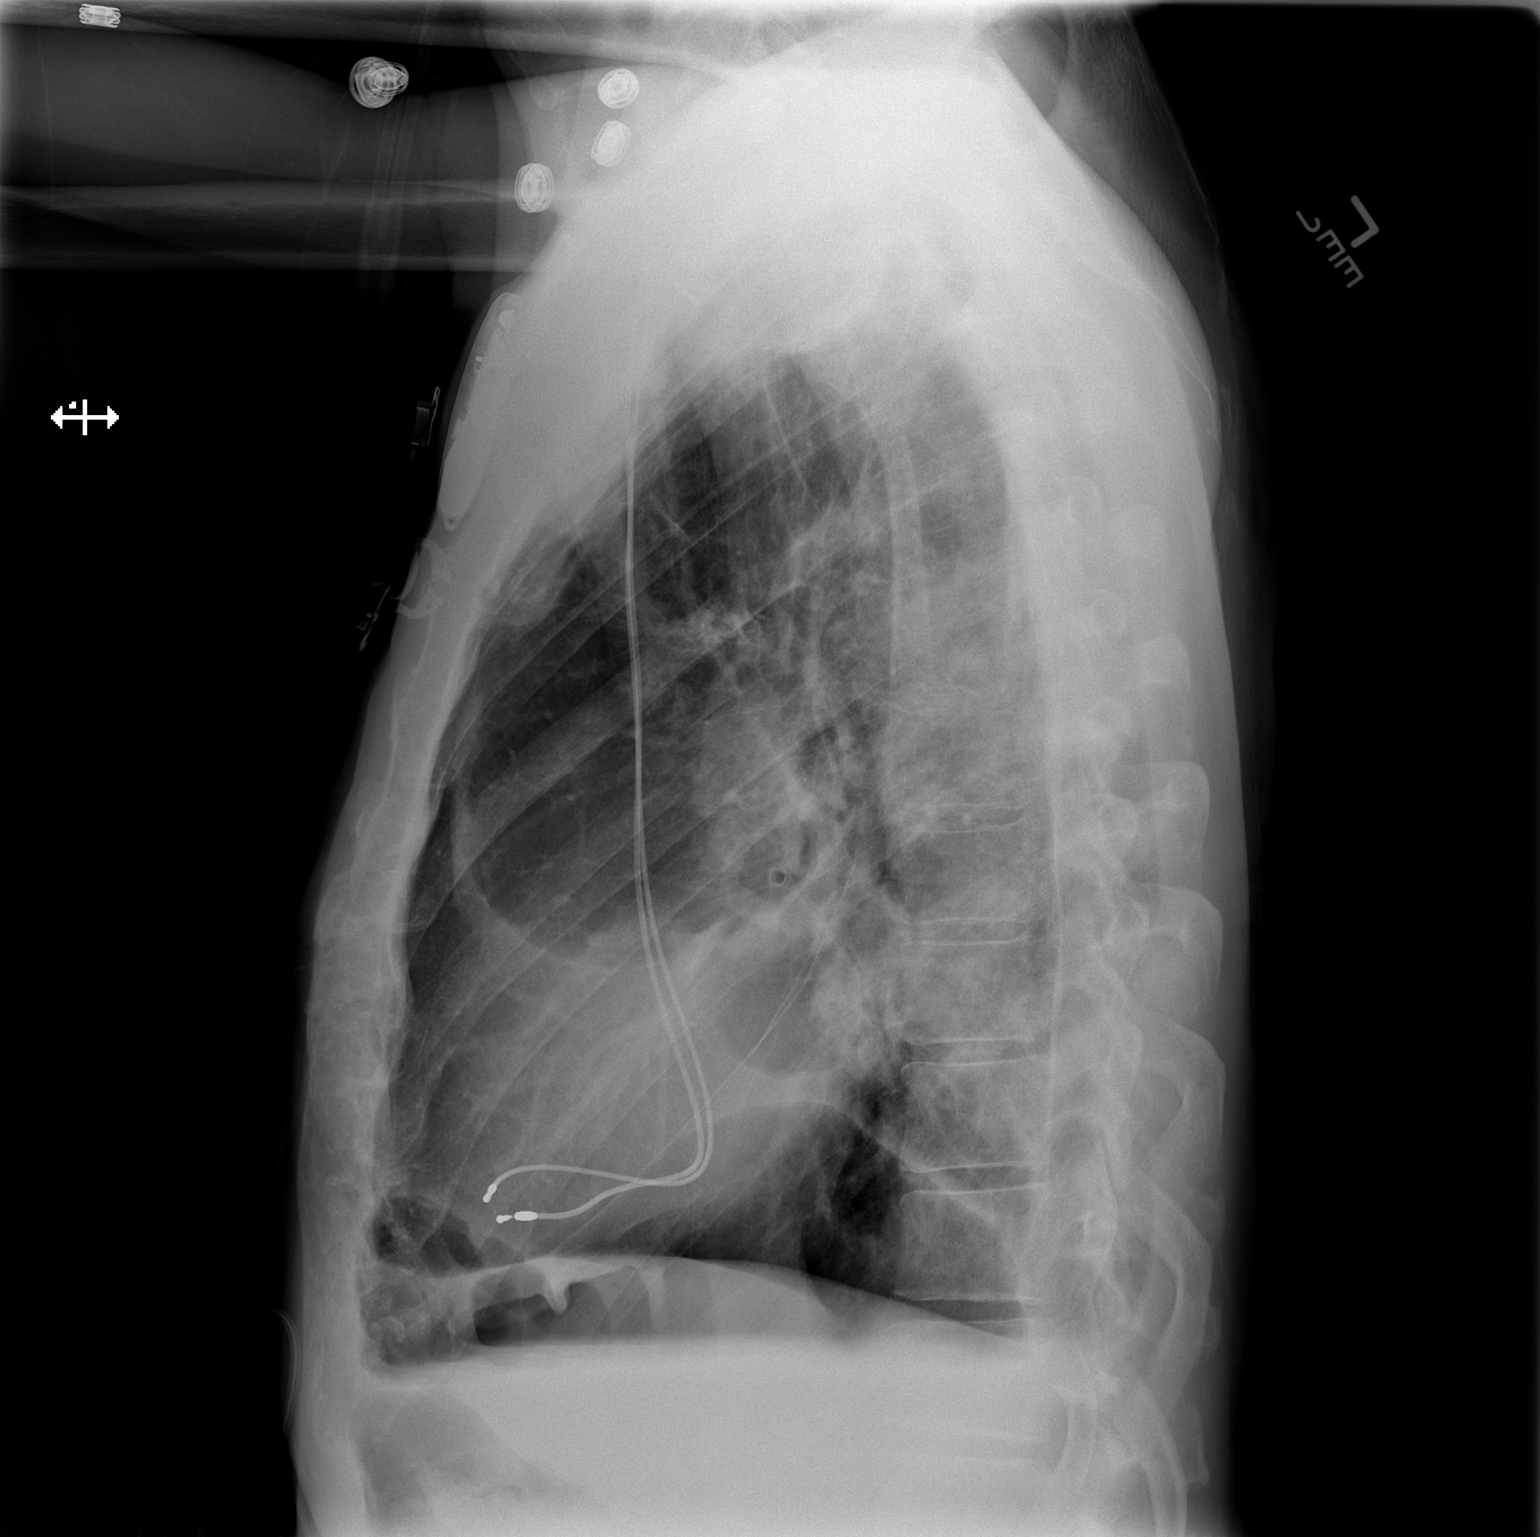

[2 of 2 positions shown; findings below may reference images not displayed]

FINDINGS: There is mild right basilar pleural thickening. There is elevation
of the left diaphragm. There is increased opacity in the left
retrocardiac region concerning for pneumonia. There is chronic
scarring involving the left mid lung. Stable cardiomediastinal
silhouette. There is a dual lead cardiac pacer. The osseous
structures are unremarkable.
IMPRESSION: Increased airspace opacity in the left retrocardiac region
concerning for a left lower lobe pneumonia. Recommend followup
radiography in 4-6 weeks, to document complete resolution following
adequate medical therapy. If there is not complete resolution, then
recommend further evaluation with CT of the chest to exclude
underlying pathology.

## 2014-08-19 NOTE — Telephone Encounter (Signed)
Pt has been informed and will come in on 4/9 to get Rx. Do you want to write the Rx and post date it.  We will keep in clinic.

## 2014-08-20 ENCOUNTER — Other Ambulatory Visit: Payer: Self-pay | Admitting: Internal Medicine

## 2014-08-23 ENCOUNTER — Other Ambulatory Visit: Payer: Self-pay | Admitting: *Deleted

## 2014-08-24 MED ORDER — OXYCODONE-ACETAMINOPHEN 10-325 MG PO TABS: 1.0000 | ORAL_TABLET | ORAL | Status: DC | PRN

## 2014-08-24 NOTE — Telephone Encounter (Signed)
Refill printed and signed and provided to refill nurse. 

## 2014-08-25 IMAGING — CT CT CHEST W/O CM
2 of 4 series · 15 of 36 positions shown, 18 images · non-contrast
Comparison: Prior chest x-ray obtained earlier today; prior CT scan
of the chest 10/12/2012 and 06/02/2010

CLINICAL DATA: Chest pain, shortness of breath

EXAM:
CT CHEST WITHOUT CONTRAST
TECHNIQUE: Multidetector CT imaging of the chest was performed following the
standard protocol without IV contrast.

[Series 2: thorax 5.0 i31f 1 · axial · 0.68mm/px · z∈[-153,+162]mm · 12 of 71 slices shown, 15 images]
[im 4/71  mediastinal]
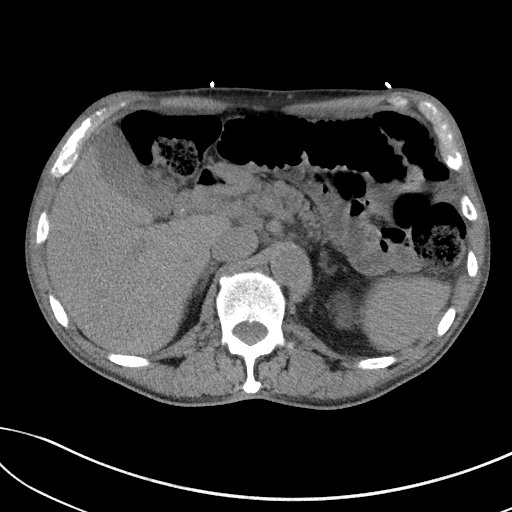
[im 4/71  lung]
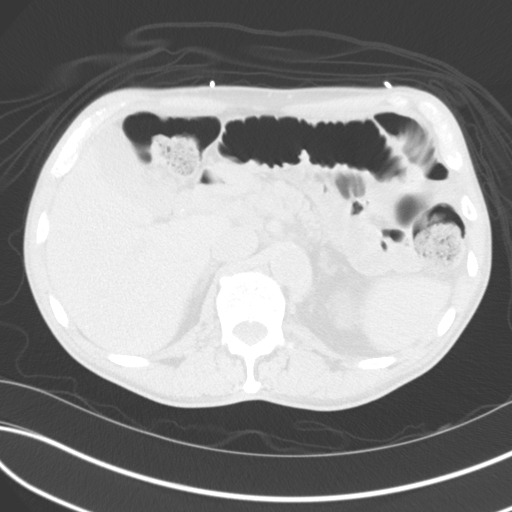
[im 10/71  lung]
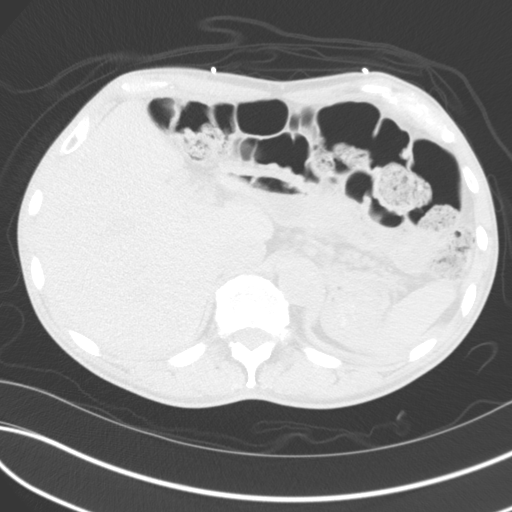
[im 16/71  lung]
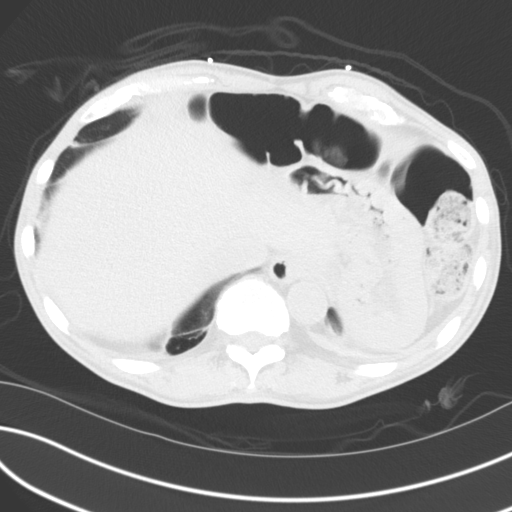
[im 23/71  lung]
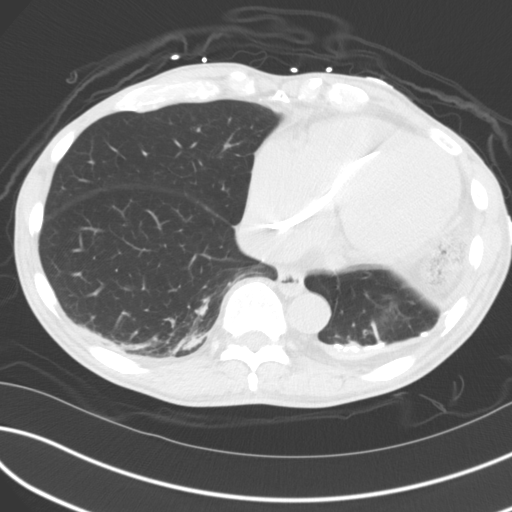
[im 26/71  mediastinal]
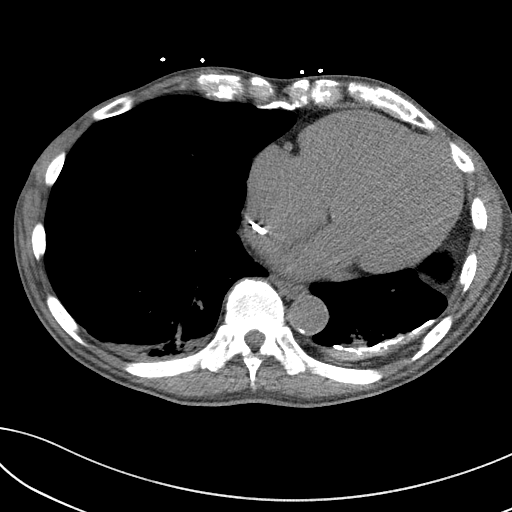
[im 26/71  lung]
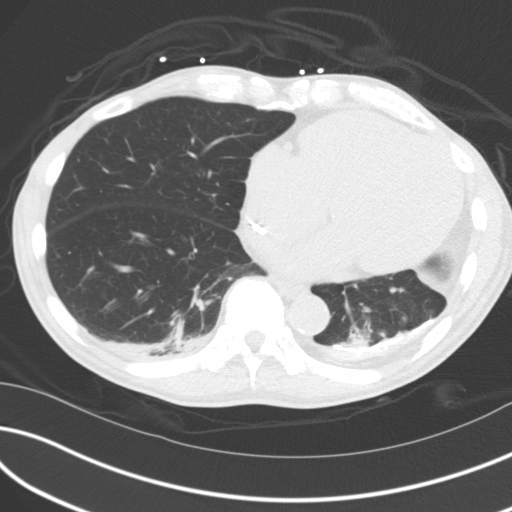
[im 32/71  lung]
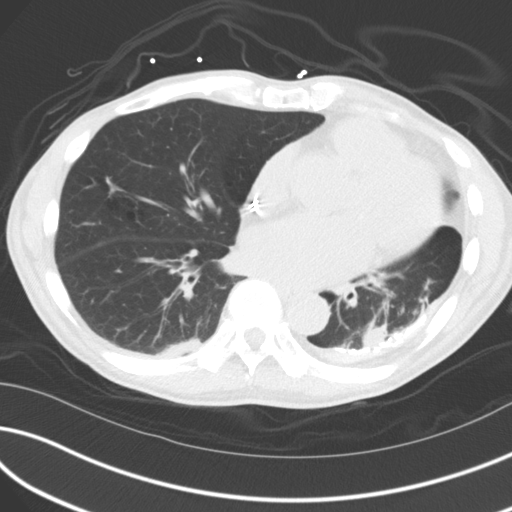
[im 39/71  lung]
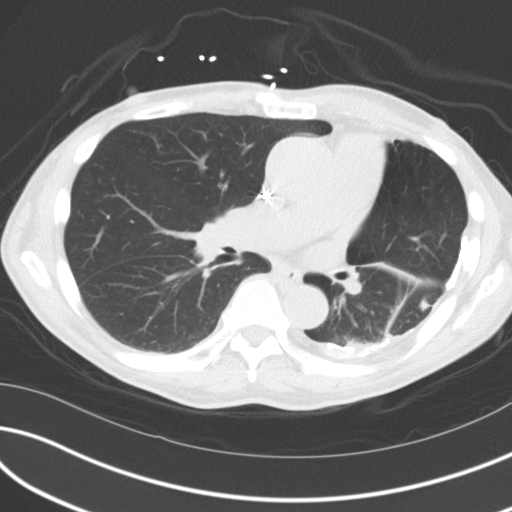
[im 45/71  lung]
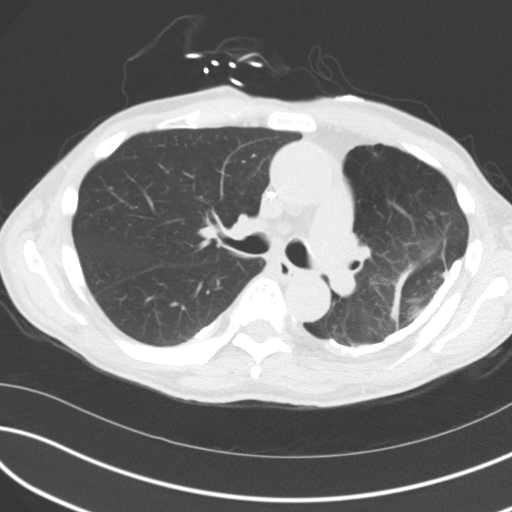
[im 48/71  mediastinal]
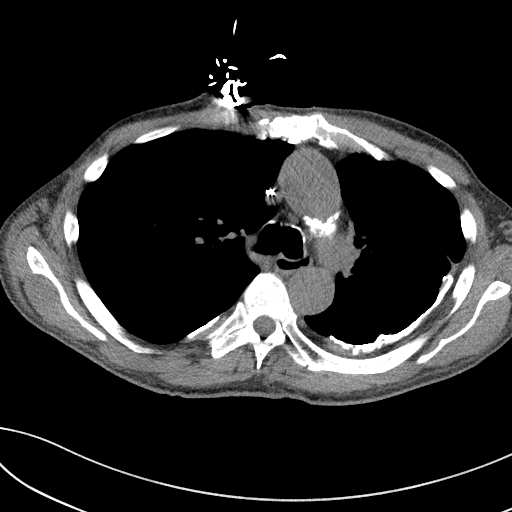
[im 48/71  lung]
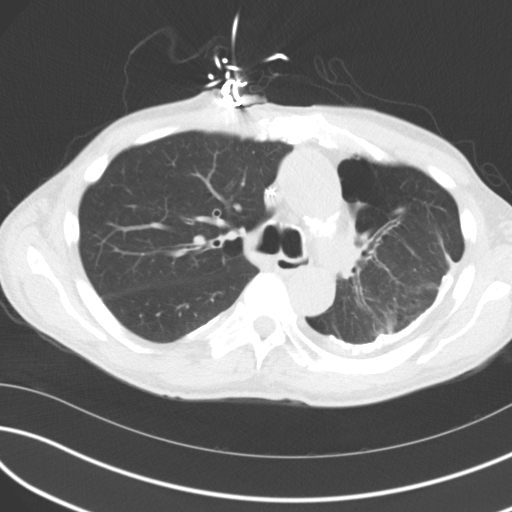
[im 55/71  lung]
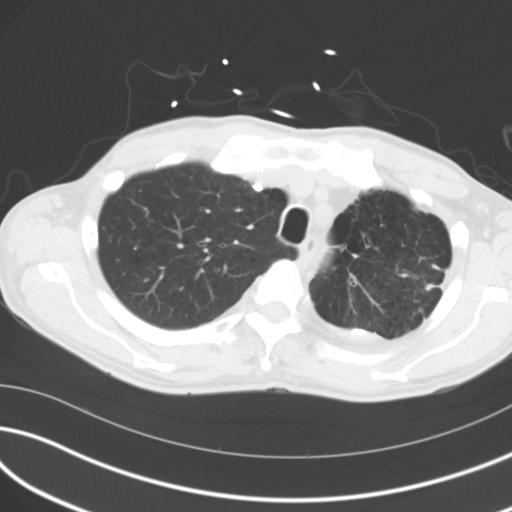
[im 61/71  lung]
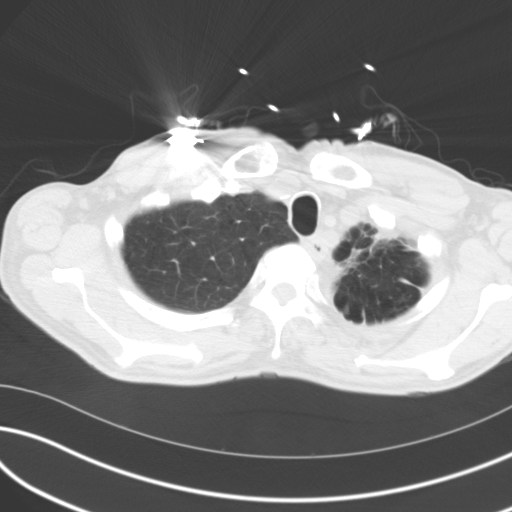
[im 67/71  lung]
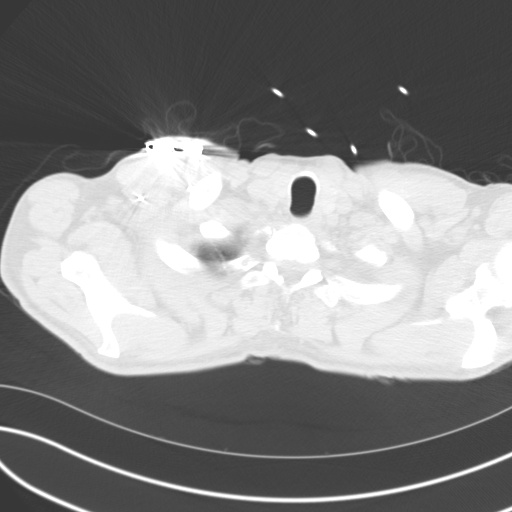

[Series 5: coronal · coronal · 0.67mm/px · 3 of 72 slices shown]
[im 15/72  lung]
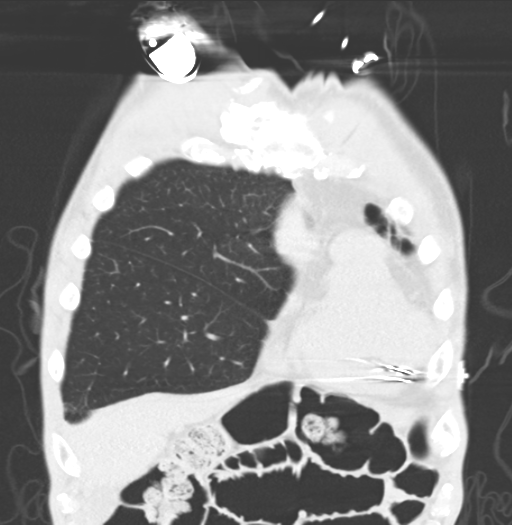
[im 29/72  lung]
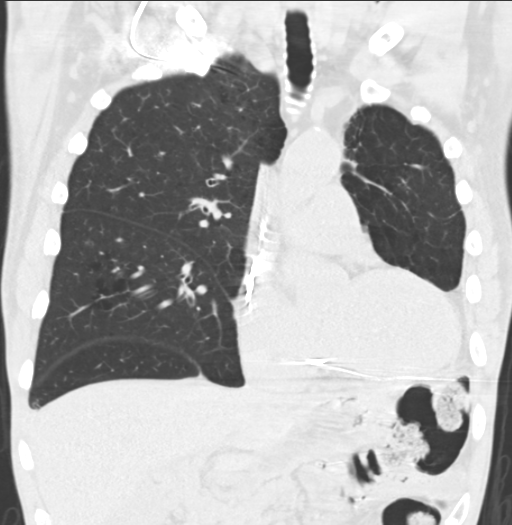
[im 43/72  lung]
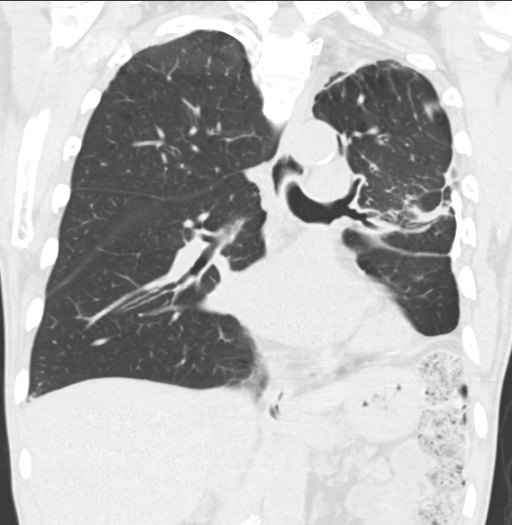

[15 of 36 positions shown; findings below may reference images not displayed]

FINDINGS: Mediastinum: Unremarkable CT appearance of the thyroid gland. No
suspicious mediastinal or hilar adenopathy. No soft tissue
mediastinal mass. The thoracic esophagus is unremarkable.

Heart/Vascular: Right subclavian approach cardiac rhythm maintenance
device. Two leads terminate in the right ventricular apex.
Cardiomegaly with left heart enlargement. No pericardial effusion.
Atherosclerotic calcifications noted in the coronary arteries.
Stable mild aneurysmal dilatation of the ascending thoracic aorta
with a maximal transverse diameter of 4.5 cm. No suggestion of acute
intramural hematoma. Conventional 3 vessel arch anatomy appeared

Lungs/Pleura: Extensive bilateral left worse than right calcified
pleural plaques. No active pleural effusion. Background of
moderately severe predominantly centrilobular emphysema. Stable 3 mm
pulmonary nodule in the periphery of the right upper lobe
inferiorly. A small centrally cavitary nodular opacity in the right
middle lobe is also remains stable at 2 years consistent with a
benign and likely postinfectious process. Chronic pleural
parenchymal scarring throughout the left lung demonstrates no
significant interval change over multiple prior studies dating back
at least 2 years. Less prominent pleural parenchymal scarring noted
in the right lower lobe.

Bones/Soft Tissues: No acute fracture or aggressive appearing lytic
or blastic osseous lesion.

Upper Abdomen: Limited in the absence of intravenous contrast. No
acute abnormality.
IMPRESSION: 1. No acute cardiopulmonary process.
2. Stable appearance of the lungs with left worse than right
calcified pleural plaques and extensive pleural parenchymal scarring
superimposed on a background of COPD and emphysema.
3. Atherosclerosis including coronary artery disease.
4. Cardiomegaly with left heart enlargement.
5. Stable fusiform aneurysmal dilatation of the ascending thoracic
aorta to 4.5 cm.

## 2014-08-25 IMAGING — CR DG CHEST 2V
2 series · 2 of 2 positions shown · non-contrast
Comparison: May 27, 2013

CLINICAL DATA: Chest pain and shortness of breath

EXAM:
CHEST  2 VIEW

[w chest pa]
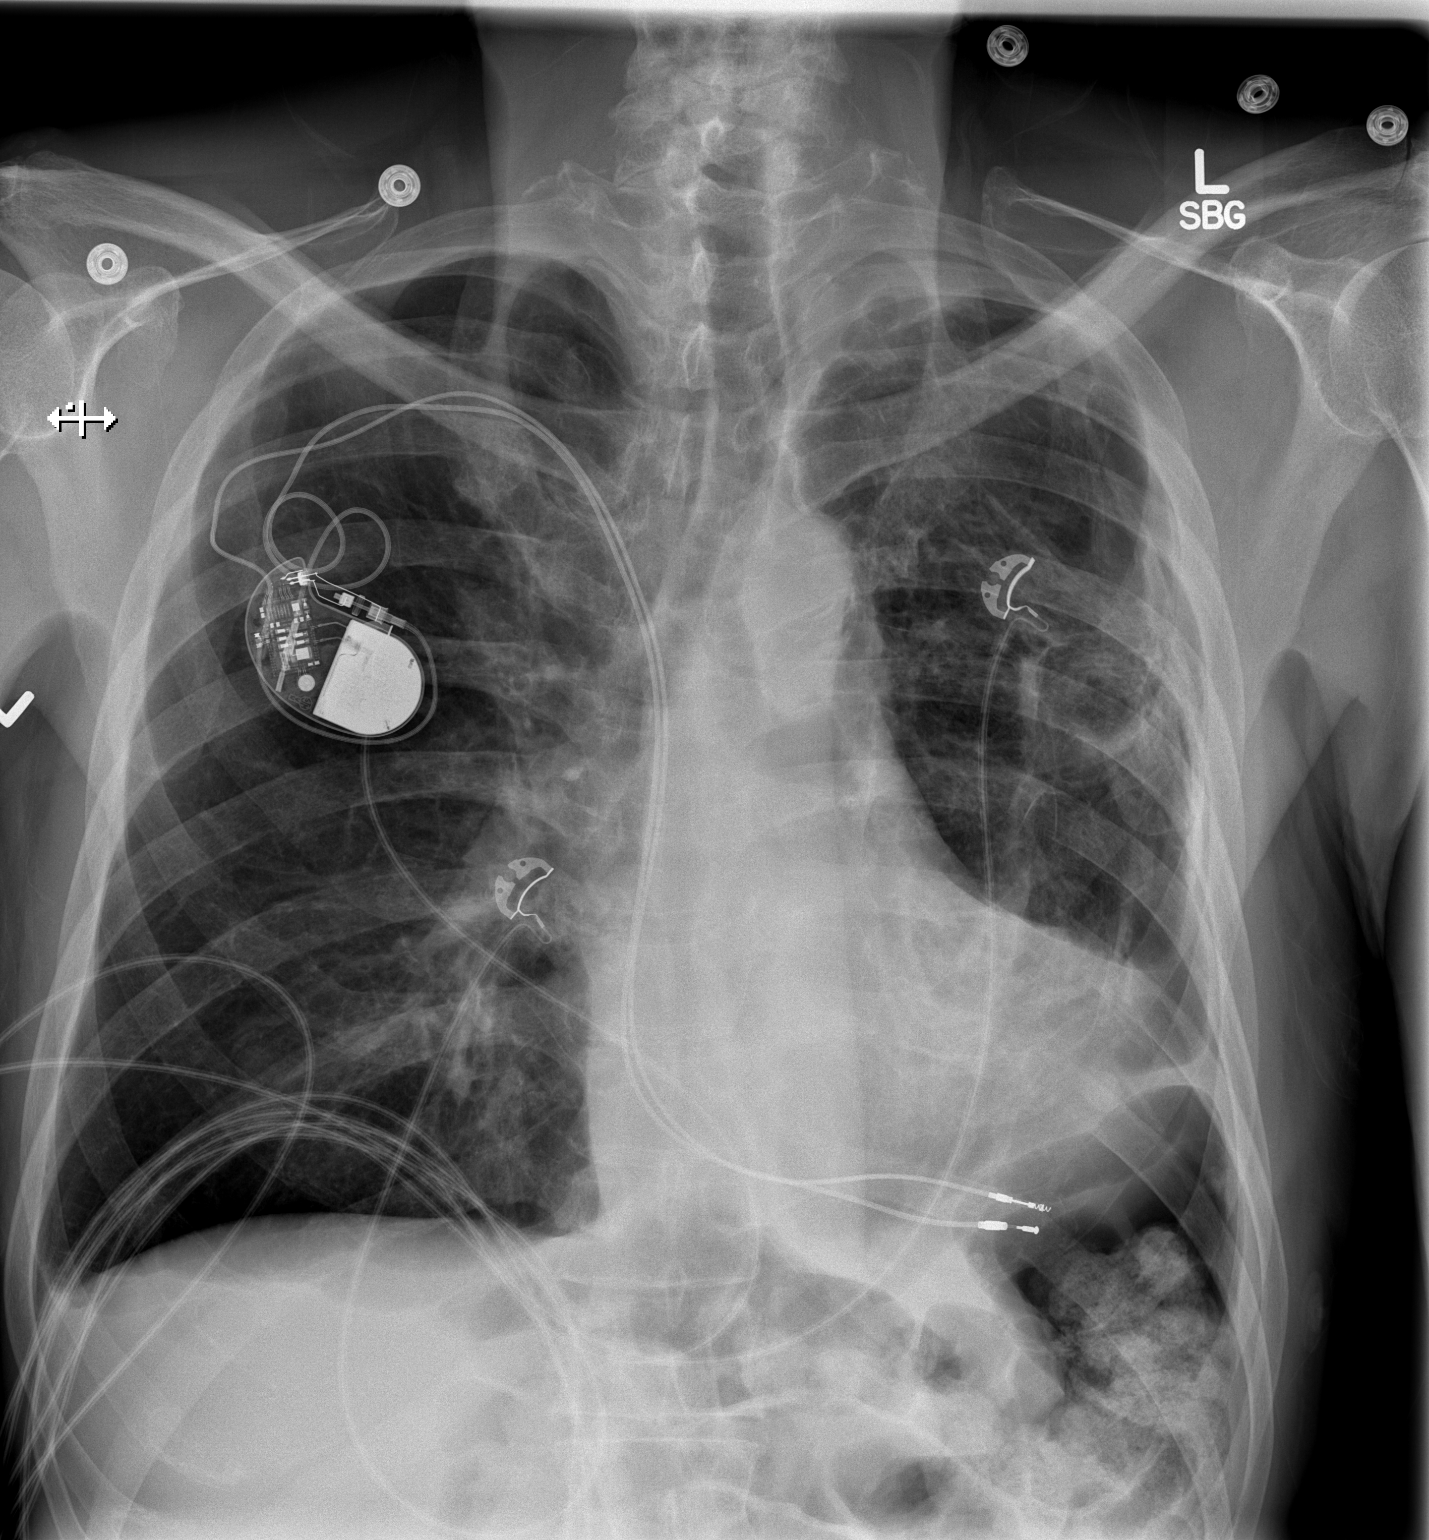

[w chest lat]
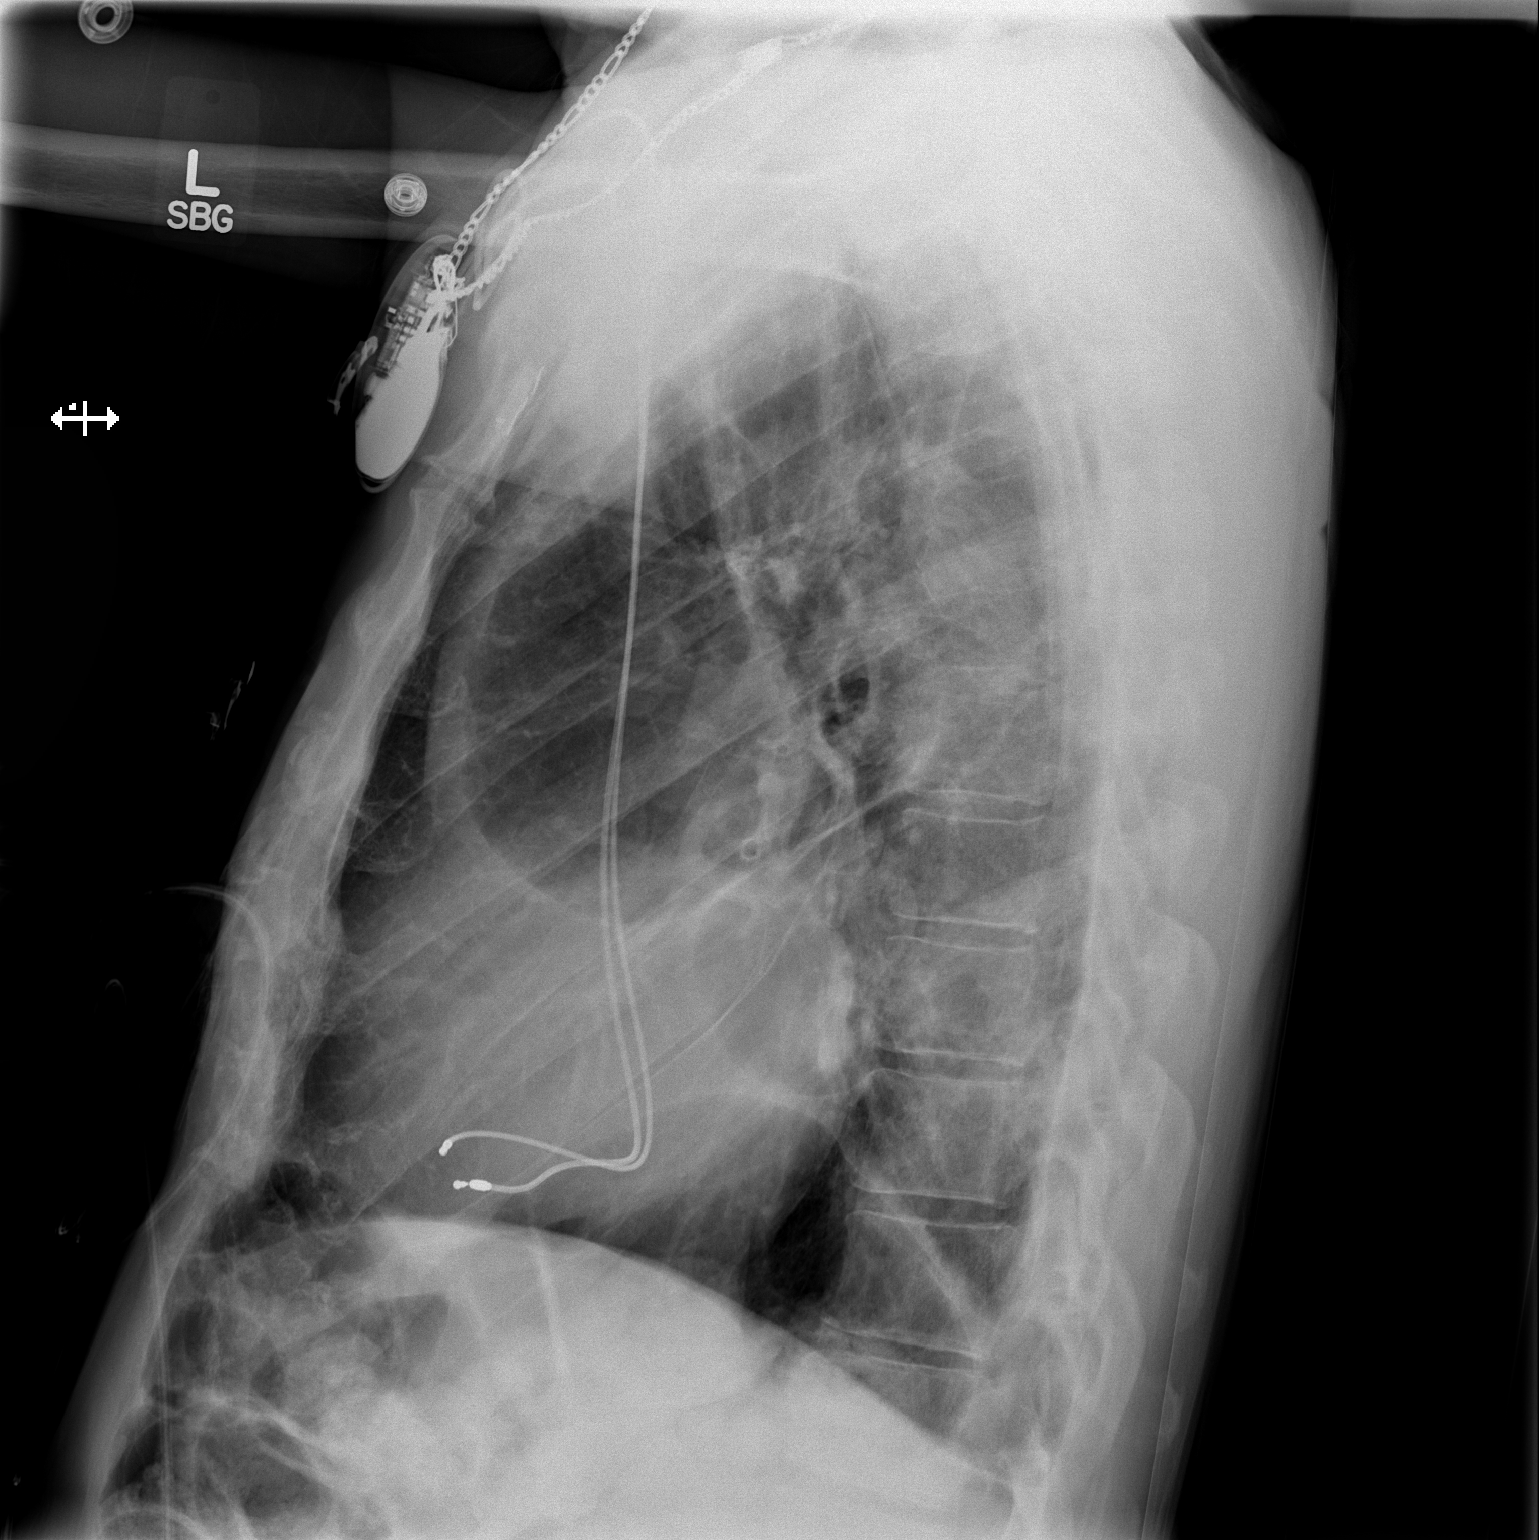

[2 of 2 positions shown; findings below may reference images not displayed]

FINDINGS: There is a nodular opacity at the left base which may represent a
nipple shadow. There is scarring in calcification on the left
consistent with prior empyema. There is no frank edema or
consolidation. There is underlying emphysematous change. Right lung
is clear. Heart is upper normal in size. The pulmonary vascularity
is stable and reflects underlying emphysema. No adenopathy.
Pacemaker leads are attached to the right ventricle. No bone
lesions.
IMPRESSION: Scarring with evidence of prior empyema on the left. Underlying
emphysema. No edema or consolidation.

Suspect nipple shadow left base; advise repeat study with nipple
markers to confirm that this nodular opacity indeed represents a
nipple shadow as opposed to a nodular lesion within the lung
parenchyma.

## 2014-08-30 ENCOUNTER — Telehealth: Payer: Self-pay | Admitting: *Deleted

## 2014-08-30 MED ORDER — ALBUTEROL SULFATE HFA 108 (90 BASE) MCG/ACT IN AERS: 2.0000 | INHALATION_SPRAY | Freq: Four times a day (QID) | RESPIRATORY_TRACT | Status: DC | PRN

## 2014-08-30 NOTE — Telephone Encounter (Signed)
Received faxed notice from pt's insurance that proventil hfa is non-preferred.  Ventolin is the preferred medication, will send info to pcp for review and medication change if appropriate. Please advise .[name], Travis Dimarzo Cassady4/12/20169:09 AM

## 2014-08-30 NOTE — Telephone Encounter (Signed)
I changed prescription as requested.

## 2014-09-05 ENCOUNTER — Other Ambulatory Visit: Payer: Self-pay | Admitting: Internal Medicine

## 2014-09-06 NOTE — Telephone Encounter (Signed)
I refilled the tamsulosin on 08/22/2014 with 3 additional refills; please call the pharmacy and confirm that they received that prescription.  I did not refill the betamethasone (Diprolene) cream; it would be best for patient to be seen in the clinic to assess his response to the cream.

## 2014-09-09 IMAGING — US US RENAL
1 series · 14 of 25 positions shown · non-contrast
Comparison: February 03, 2013.

CLINICAL DATA: Urinary retention. Chronic kidney disease.
Hypertension.

EXAM:
RENAL/URINARY TRACT ULTRASOUND COMPLETE

[Series 1: us renal · 0.22mm/px · 14 of 41 slices shown]
[im 1/41]
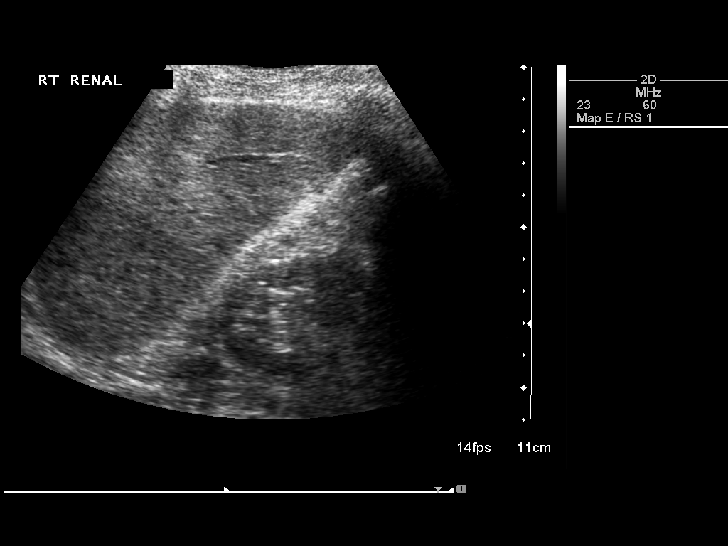
[im 4/41]
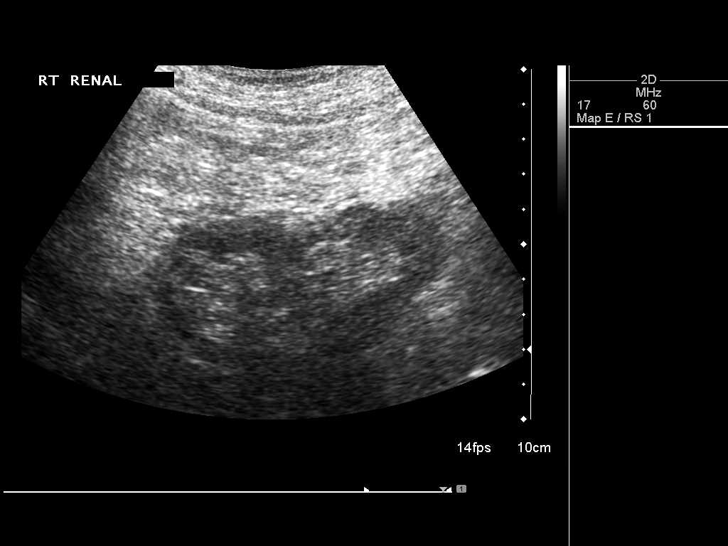
[im 7/41]
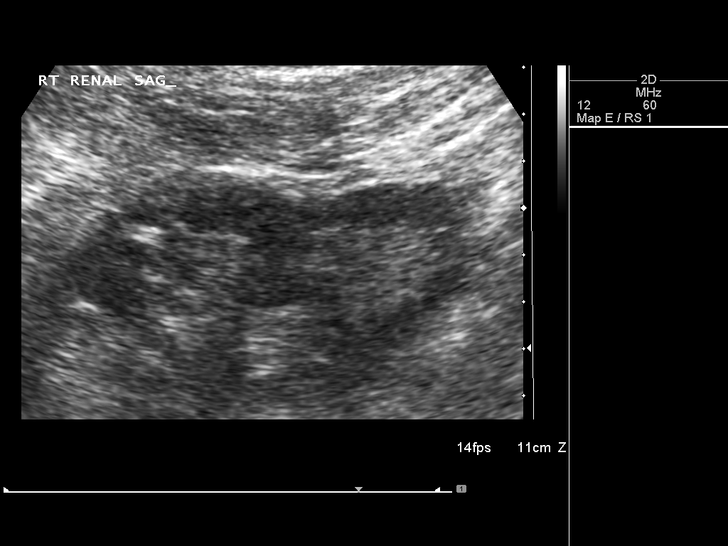
[im 11/41]
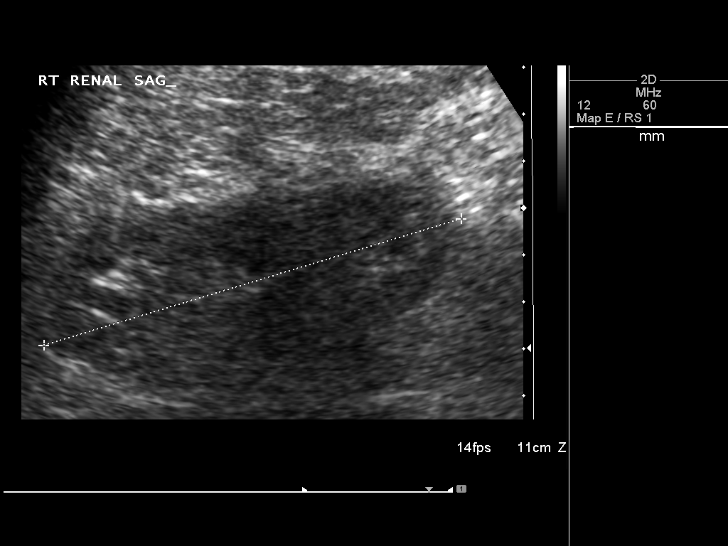
[im 14/41]
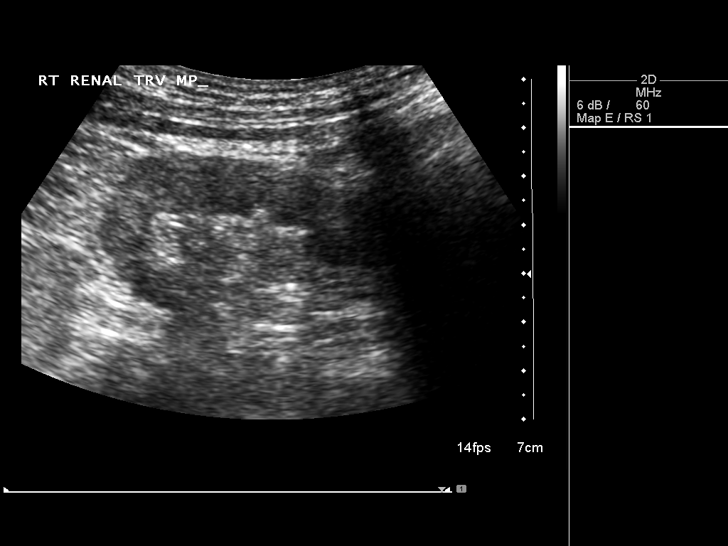
[im 16/41]
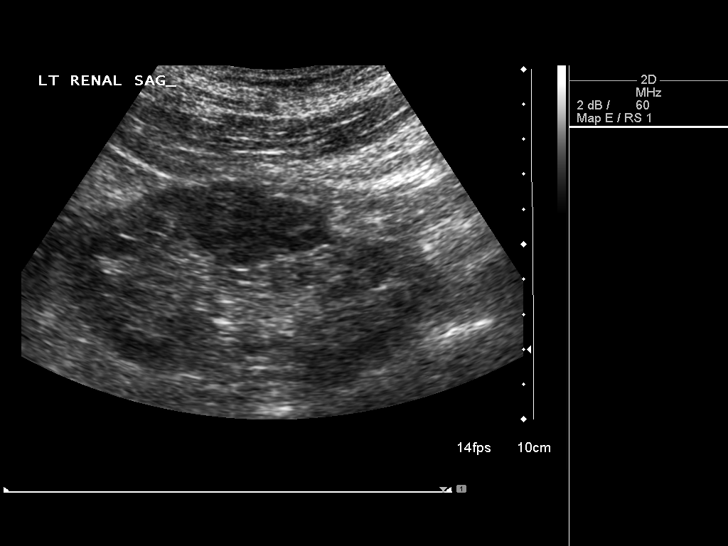
[im 19/41]
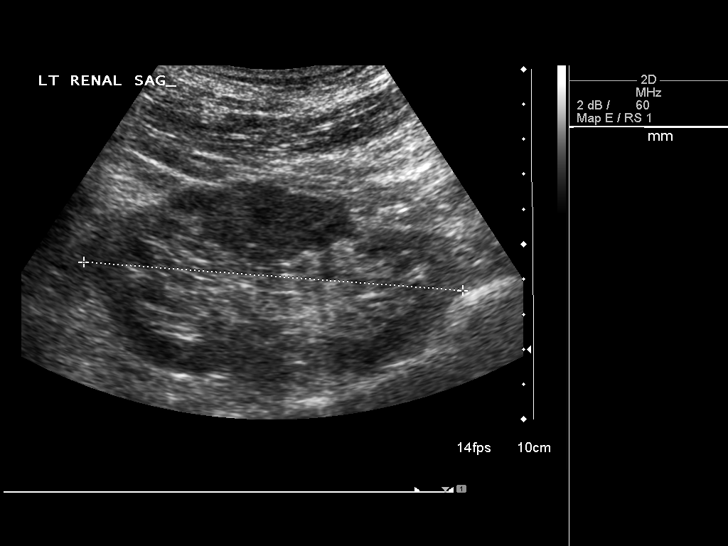
[im 22/41]
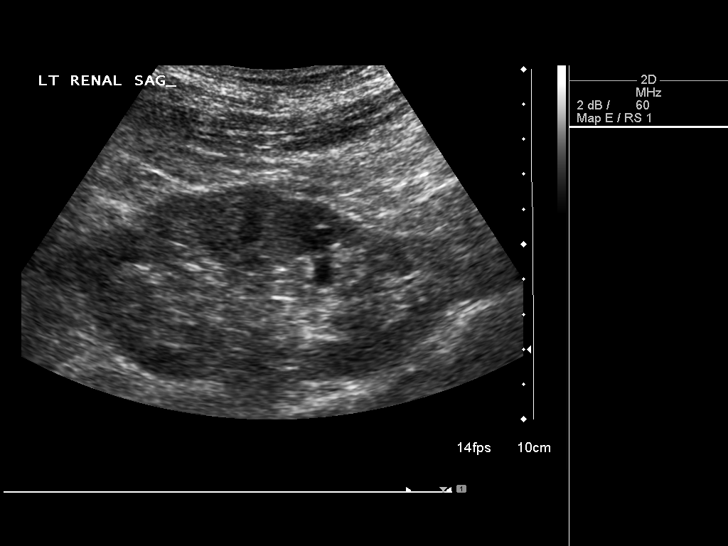
[im 26/41]
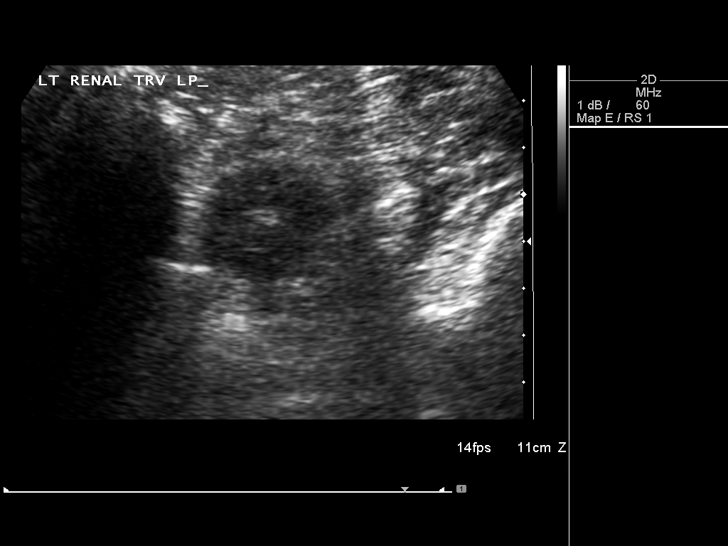
[im 27/41]
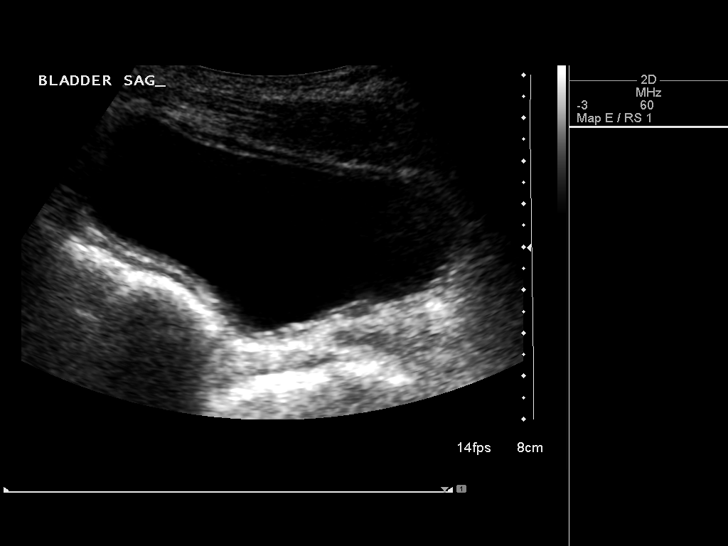
[im 31/41]
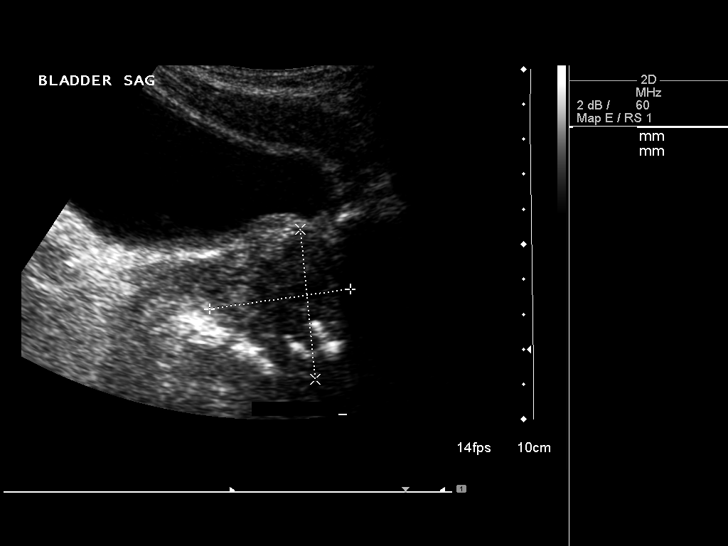
[im 34/41]
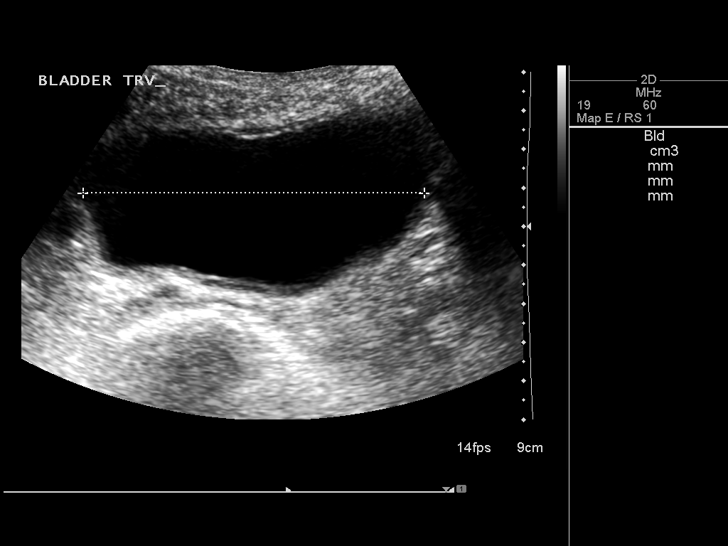
[im 37/41]
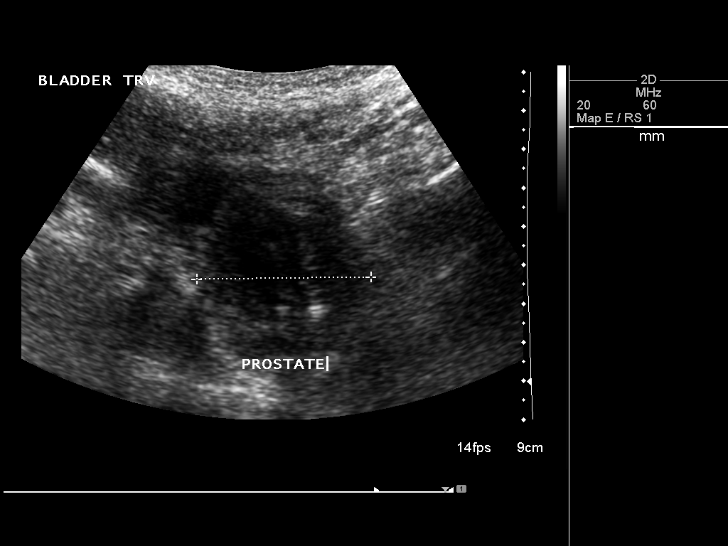
[im 41/41]
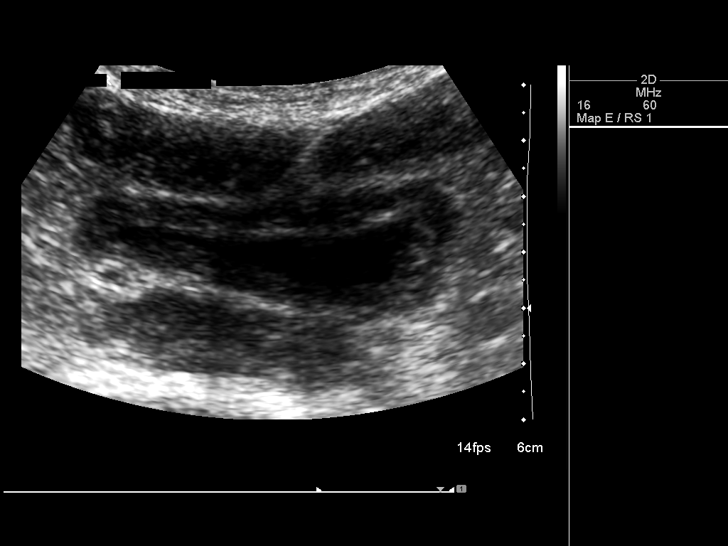

[14 of 25 positions shown; findings below may reference images not displayed]

FINDINGS: Right Kidney:

Length: 9.3 cm. Echogenicity within normal limits. Column of Bertin
is noted. No mass or hydronephrosis visualized.

Left Kidney:

Length: 10.8 cm. Echogenicity within normal limits. No mass or
hydronephrosis visualized.

Bladder:

Appears normal for degree of bladder distention. The prevoid volume
is 192 cc. The postvoid volume is 12.5 cc.

The prostate gland measures 4.1 x 4.3 x 4.5 cm.
IMPRESSION: No acute abnormality.  No significant change since prior exam.

## 2014-09-20 ENCOUNTER — Other Ambulatory Visit: Payer: Self-pay | Admitting: *Deleted

## 2014-09-21 MED ORDER — OXYCODONE-ACETAMINOPHEN 10-325 MG PO TABS: 1.0000 | ORAL_TABLET | ORAL | Status: DC | PRN

## 2014-10-02 ENCOUNTER — Other Ambulatory Visit: Payer: Self-pay | Admitting: Internal Medicine

## 2014-10-03 ENCOUNTER — Other Ambulatory Visit: Payer: Self-pay

## 2014-10-03 MED ORDER — DILTIAZEM HCL ER COATED BEADS 240 MG PO CP24: 240.0000 mg | ORAL_CAPSULE | Freq: Two times a day (BID) | ORAL | Status: DC

## 2014-10-03 NOTE — Telephone Encounter (Signed)
He needs a follow up appointment with Dr. Elie Confer re: his anticoagulation.

## 2014-10-11 ENCOUNTER — Inpatient Hospital Stay (HOSPITAL_COMMUNITY)
Admission: EM | Admit: 2014-10-11 | Discharge: 2014-10-19 | DRG: 252 | Disposition: A | Discharge status: Home Health | Payer: Commercial Managed Care - HMO | Attending: Internal Medicine | Admitting: Internal Medicine

## 2014-10-11 ENCOUNTER — Inpatient Hospital Stay (HOSPITAL_COMMUNITY): Payer: Commercial Managed Care - HMO

## 2014-10-11 ENCOUNTER — Emergency Department (HOSPITAL_COMMUNITY): Payer: Commercial Managed Care - HMO

## 2014-10-11 ENCOUNTER — Other Ambulatory Visit (HOSPITAL_COMMUNITY): Payer: Self-pay

## 2014-10-11 ENCOUNTER — Encounter (HOSPITAL_COMMUNITY): Payer: Self-pay | Admitting: Emergency Medicine

## 2014-10-11 ENCOUNTER — Encounter (HOSPITAL_COMMUNITY)
Admission: EM | Disposition: A | Discharge status: Home Health | Payer: Self-pay | Source: Home / Self Care | Attending: Internal Medicine

## 2014-10-11 DIAGNOSIS — N189 Chronic kidney disease, unspecified: Secondary | ICD-10-CM

## 2014-10-11 DIAGNOSIS — I2510 Atherosclerotic heart disease of native coronary artery without angina pectoris: Secondary | ICD-10-CM | POA: Diagnosis not present

## 2014-10-11 DIAGNOSIS — Z79899 Other long term (current) drug therapy: Secondary | ICD-10-CM

## 2014-10-11 DIAGNOSIS — K219 Gastro-esophageal reflux disease without esophagitis: Secondary | ICD-10-CM | POA: Diagnosis present

## 2014-10-11 DIAGNOSIS — R911 Solitary pulmonary nodule: Secondary | ICD-10-CM | POA: Diagnosis present

## 2014-10-11 DIAGNOSIS — R627 Adult failure to thrive: Secondary | ICD-10-CM | POA: Diagnosis present

## 2014-10-11 DIAGNOSIS — Z96643 Presence of artificial hip joint, bilateral: Secondary | ICD-10-CM | POA: Diagnosis present

## 2014-10-11 DIAGNOSIS — D696 Thrombocytopenia, unspecified: Secondary | ICD-10-CM | POA: Diagnosis present

## 2014-10-11 DIAGNOSIS — R4781 Slurred speech: Secondary | ICD-10-CM | POA: Diagnosis not present

## 2014-10-11 DIAGNOSIS — A599 Trichomoniasis, unspecified: Secondary | ICD-10-CM | POA: Diagnosis present

## 2014-10-11 DIAGNOSIS — Z9119 Patient's noncompliance with other medical treatment and regimen: Secondary | ICD-10-CM | POA: Diagnosis present

## 2014-10-11 DIAGNOSIS — I480 Paroxysmal atrial fibrillation: Secondary | ICD-10-CM | POA: Diagnosis not present

## 2014-10-11 DIAGNOSIS — I77810 Thoracic aortic ectasia: Secondary | ICD-10-CM | POA: Diagnosis not present

## 2014-10-11 DIAGNOSIS — J441 Chronic obstructive pulmonary disease with (acute) exacerbation: Secondary | ICD-10-CM | POA: Diagnosis present

## 2014-10-11 DIAGNOSIS — R634 Abnormal weight loss: Secondary | ICD-10-CM | POA: Diagnosis not present

## 2014-10-11 DIAGNOSIS — R740 Nonspecific elevation of levels of transaminase and lactic acid dehydrogenase [LDH]: Secondary | ICD-10-CM | POA: Diagnosis present

## 2014-10-11 DIAGNOSIS — F1210 Cannabis abuse, uncomplicated: Secondary | ICD-10-CM | POA: Diagnosis present

## 2014-10-11 DIAGNOSIS — N183 Chronic kidney disease, stage 3 (moderate): Secondary | ICD-10-CM | POA: Diagnosis present

## 2014-10-11 DIAGNOSIS — I743 Embolism and thrombosis of arteries of the lower extremities: Secondary | ICD-10-CM | POA: Diagnosis not present

## 2014-10-11 DIAGNOSIS — R0602 Shortness of breath: Secondary | ICD-10-CM

## 2014-10-11 DIAGNOSIS — I213 ST elevation (STEMI) myocardial infarction of unspecified site: Secondary | ICD-10-CM

## 2014-10-11 DIAGNOSIS — N400 Enlarged prostate without lower urinary tract symptoms: Secondary | ICD-10-CM | POA: Diagnosis present

## 2014-10-11 DIAGNOSIS — G458 Other transient cerebral ischemic attacks and related syndromes: Secondary | ICD-10-CM

## 2014-10-11 DIAGNOSIS — F319 Bipolar disorder, unspecified: Secondary | ICD-10-CM | POA: Diagnosis present

## 2014-10-11 DIAGNOSIS — J929 Pleural plaque without asbestos: Secondary | ICD-10-CM | POA: Diagnosis not present

## 2014-10-11 DIAGNOSIS — I429 Cardiomyopathy, unspecified: Secondary | ICD-10-CM | POA: Diagnosis not present

## 2014-10-11 DIAGNOSIS — I6789 Other cerebrovascular disease: Secondary | ICD-10-CM

## 2014-10-11 DIAGNOSIS — E43 Unspecified severe protein-calorie malnutrition: Secondary | ICD-10-CM | POA: Diagnosis present

## 2014-10-11 DIAGNOSIS — G459 Transient cerebral ischemic attack, unspecified: Secondary | ICD-10-CM | POA: Diagnosis not present

## 2014-10-11 DIAGNOSIS — I82412 Acute embolism and thrombosis of left femoral vein: Secondary | ICD-10-CM | POA: Diagnosis not present

## 2014-10-11 DIAGNOSIS — I10 Essential (primary) hypertension: Secondary | ICD-10-CM | POA: Diagnosis not present

## 2014-10-11 DIAGNOSIS — R29810 Facial weakness: Secondary | ICD-10-CM | POA: Diagnosis not present

## 2014-10-11 DIAGNOSIS — Z681 Body mass index (BMI) 19 or less, adult: Secondary | ICD-10-CM | POA: Diagnosis not present

## 2014-10-11 DIAGNOSIS — I517 Cardiomegaly: Secondary | ICD-10-CM | POA: Diagnosis not present

## 2014-10-11 DIAGNOSIS — I482 Chronic atrial fibrillation, unspecified: Secondary | ICD-10-CM | POA: Diagnosis present

## 2014-10-11 DIAGNOSIS — I5042 Chronic combined systolic (congestive) and diastolic (congestive) heart failure: Secondary | ICD-10-CM

## 2014-10-11 DIAGNOSIS — I4891 Unspecified atrial fibrillation: Secondary | ICD-10-CM | POA: Diagnosis not present

## 2014-10-11 DIAGNOSIS — F17210 Nicotine dependence, cigarettes, uncomplicated: Secondary | ICD-10-CM | POA: Diagnosis present

## 2014-10-11 DIAGNOSIS — Z86718 Personal history of other venous thrombosis and embolism: Secondary | ICD-10-CM | POA: Diagnosis not present

## 2014-10-11 DIAGNOSIS — E785 Hyperlipidemia, unspecified: Secondary | ICD-10-CM | POA: Diagnosis present

## 2014-10-11 DIAGNOSIS — Z7901 Long term (current) use of anticoagulants: Secondary | ICD-10-CM | POA: Diagnosis not present

## 2014-10-11 DIAGNOSIS — I2109 ST elevation (STEMI) myocardial infarction involving other coronary artery of anterior wall: Secondary | ICD-10-CM | POA: Diagnosis not present

## 2014-10-11 DIAGNOSIS — I4811 Longstanding persistent atrial fibrillation: Secondary | ICD-10-CM | POA: Diagnosis present

## 2014-10-11 DIAGNOSIS — I998 Other disorder of circulatory system: Secondary | ICD-10-CM | POA: Diagnosis not present

## 2014-10-11 DIAGNOSIS — R6881 Early satiety: Secondary | ICD-10-CM | POA: Diagnosis not present

## 2014-10-11 DIAGNOSIS — E860 Dehydration: Secondary | ICD-10-CM | POA: Diagnosis present

## 2014-10-11 DIAGNOSIS — Z950 Presence of cardiac pacemaker: Secondary | ICD-10-CM | POA: Diagnosis not present

## 2014-10-11 DIAGNOSIS — J449 Chronic obstructive pulmonary disease, unspecified: Secondary | ICD-10-CM

## 2014-10-11 DIAGNOSIS — N179 Acute kidney failure, unspecified: Secondary | ICD-10-CM | POA: Diagnosis not present

## 2014-10-11 DIAGNOSIS — F339 Major depressive disorder, recurrent, unspecified: Secondary | ICD-10-CM | POA: Diagnosis present

## 2014-10-11 DIAGNOSIS — N261 Atrophy of kidney (terminal): Secondary | ICD-10-CM | POA: Diagnosis not present

## 2014-10-11 DIAGNOSIS — I129 Hypertensive chronic kidney disease with stage 1 through stage 4 chronic kidney disease, or unspecified chronic kidney disease: Secondary | ICD-10-CM | POA: Diagnosis present

## 2014-10-11 DIAGNOSIS — Z4889 Encounter for other specified surgical aftercare: Secondary | ICD-10-CM | POA: Diagnosis not present

## 2014-10-11 DIAGNOSIS — R918 Other nonspecific abnormal finding of lung field: Secondary | ICD-10-CM | POA: Diagnosis not present

## 2014-10-11 DIAGNOSIS — Z419 Encounter for procedure for purposes other than remedying health state, unspecified: Secondary | ICD-10-CM

## 2014-10-11 HISTORY — DX: ST elevation (STEMI) myocardial infarction of unspecified site: I21.3

## 2014-10-11 LAB — COMPREHENSIVE METABOLIC PANEL
ALK PHOS: 76 U/L (ref 38–126)
ALT: 15 U/L — ABNORMAL LOW (ref 17–63)
AST: 72 U/L — AB (ref 15–41)
Albumin: 3.7 g/dL (ref 3.5–5.0)
Anion gap: 10 (ref 5–15)
BUN: 17 mg/dL (ref 6–20)
CALCIUM: 9.3 mg/dL (ref 8.9–10.3)
CO2: 24 mmol/L (ref 22–32)
Chloride: 109 mmol/L (ref 101–111)
Creatinine, Ser: 1.68 mg/dL — ABNORMAL HIGH (ref 0.61–1.24)
GFR calc non Af Amer: 40 mL/min — ABNORMAL LOW (ref >60)
GFR, EST AFRICAN AMERICAN: 46 mL/min — AB (ref >60)
Glucose, Bld: 116 mg/dL — ABNORMAL HIGH (ref 65–99)
Potassium: 4.1 mmol/L (ref 3.5–5.1)
Sodium: 143 mmol/L (ref 135–145)
Total Bilirubin: 1.2 mg/dL (ref 0.3–1.2)
Total Protein: 7.2 g/dL (ref 6.5–8.1)

## 2014-10-11 LAB — URINALYSIS, ROUTINE W REFLEX MICROSCOPIC
BILIRUBIN URINE: NEGATIVE
Glucose, UA: NEGATIVE mg/dL
KETONES UR: NEGATIVE mg/dL
NITRITE: NEGATIVE
PROTEIN: NEGATIVE mg/dL
Specific Gravity, Urine: 1.01 (ref 1.005–1.030)
Urobilinogen, UA: 1 mg/dL (ref 0.0–1.0)
pH: 7 (ref 5.0–8.0)

## 2014-10-11 LAB — HEPARIN LEVEL (UNFRACTIONATED): Heparin Unfractionated: 0.4 IU/mL (ref 0.30–0.70)

## 2014-10-11 LAB — CBC
HEMATOCRIT: 43.6 % (ref 39.0–52.0)
Hemoglobin: 14.6 g/dL (ref 13.0–17.0)
MCH: 30.4 pg (ref 26.0–34.0)
MCHC: 33.5 g/dL (ref 30.0–36.0)
MCV: 90.8 fL (ref 78.0–100.0)
Platelets: 131 10*3/uL — ABNORMAL LOW (ref 150–400)
RBC: 4.8 MIL/uL (ref 4.22–5.81)
RDW: 14.7 % (ref 11.5–15.5)
WBC: 7.8 10*3/uL (ref 4.0–10.5)

## 2014-10-11 LAB — I-STAT TROPONIN, ED: TROPONIN I, POC: 25.83 ng/mL — AB (ref 0.00–0.08)

## 2014-10-11 LAB — DIFFERENTIAL
BASOS ABS: 0 10*3/uL (ref 0.0–0.1)
Basophils Relative: 0 % (ref 0–1)
Eosinophils Absolute: 0.1 10*3/uL (ref 0.0–0.7)
Eosinophils Relative: 1 % (ref 0–5)
Lymphocytes Relative: 17 % (ref 12–46)
Lymphs Abs: 1.3 10*3/uL (ref 0.7–4.0)
Monocytes Absolute: 1 10*3/uL (ref 0.1–1.0)
Monocytes Relative: 12 % (ref 3–12)
NEUTROS ABS: 5.4 10*3/uL (ref 1.7–7.7)
NEUTROS PCT: 70 % (ref 43–77)

## 2014-10-11 LAB — TROPONIN I
TROPONIN I: 20.7 ng/mL — AB (ref <0.031)
TROPONIN I: 17.01 ng/mL — AB (ref <0.031)
TROPONIN I: 16.22 ng/mL — AB (ref <0.031)

## 2014-10-11 LAB — I-STAT CHEM 8, ED
BUN: 20 mg/dL (ref 6–20)
CALCIUM ION: 1.15 mmol/L (ref 1.13–1.30)
Chloride: 107 mmol/L (ref 101–111)
Creatinine, Ser: 1.6 mg/dL — ABNORMAL HIGH (ref 0.61–1.24)
Glucose, Bld: 114 mg/dL — ABNORMAL HIGH (ref 65–99)
HEMATOCRIT: 49 % (ref 39.0–52.0)
HEMOGLOBIN: 16.7 g/dL (ref 13.0–17.0)
POTASSIUM: 4 mmol/L (ref 3.5–5.1)
Sodium: 143 mmol/L (ref 135–145)
TCO2: 19 mmol/L (ref 0–100)

## 2014-10-11 LAB — CREATININE, URINE, RANDOM: Creatinine, Urine: 69.08 mg/dL

## 2014-10-11 LAB — URINE MICROSCOPIC-ADD ON

## 2014-10-11 LAB — RAPID URINE DRUG SCREEN, HOSP PERFORMED
Amphetamines: NOT DETECTED
BARBITURATES: NOT DETECTED
BENZODIAZEPINES: NOT DETECTED
COCAINE: NOT DETECTED
Opiates: NOT DETECTED
TETRAHYDROCANNABINOL: POSITIVE — AB

## 2014-10-11 LAB — SODIUM, URINE, RANDOM: Sodium, Ur: 88 mmol/L

## 2014-10-11 LAB — MAGNESIUM
Magnesium: 2 mg/dL (ref 1.7–2.4)
Magnesium: 1.8 mg/dL (ref 1.7–2.4)

## 2014-10-11 LAB — LACTIC ACID, PLASMA: Lactic Acid, Venous: 1.8 mmol/L (ref 0.5–2.0)

## 2014-10-11 LAB — PROTIME-INR
INR: 1.18 (ref 0.00–1.49)
PROTHROMBIN TIME: 15.1 s (ref 11.6–15.2)

## 2014-10-11 LAB — MRSA PCR SCREENING: MRSA BY PCR: NEGATIVE

## 2014-10-11 LAB — TSH
TSH: 1.516 u[IU]/mL (ref 0.350–4.500)
TSH: 2.031 u[IU]/mL (ref 0.350–4.500)

## 2014-10-11 LAB — ETHANOL: Alcohol, Ethyl (B): <5 mg/dL (ref <5)

## 2014-10-11 LAB — APTT: APTT: 28 s (ref 24–37)

## 2014-10-11 SURGERY — LEFT HEART CATH AND CORONARY ANGIOGRAPHY
Anesthesia: LOCAL

## 2014-10-11 MED ORDER — METOPROLOL TARTRATE 1 MG/ML IV SOLN
2.5000 mg | INTRAVENOUS | Status: DC | PRN
Start: 2014-10-11 — End: 2014-10-19
  Administered 2014-10-11 – 2014-10-16 (×5): 2.5 mg via INTRAVENOUS
  Filled 2014-10-11 (×4): qty 5

## 2014-10-11 MED ORDER — PANTOPRAZOLE SODIUM 40 MG PO TBEC
40.0000 mg | DELAYED_RELEASE_TABLET | Freq: Every day | ORAL | Status: DC
  Administered 2014-10-13 – 2014-10-19 (×7): 40 mg via ORAL
  Filled 2014-10-11 (×7): qty 1

## 2014-10-11 MED ORDER — TIOTROPIUM BROMIDE MONOHYDRATE 18 MCG IN CAPS
18.0000 ug | ORAL_CAPSULE | Freq: Every day | RESPIRATORY_TRACT | Status: DC
  Administered 2014-10-14 – 2014-10-19 (×4): 18 ug via RESPIRATORY_TRACT
  Filled 2014-10-11 (×2): qty 5

## 2014-10-11 MED ORDER — DILTIAZEM HCL 100 MG IV SOLR
5.0000 mg/h | Freq: Once | INTRAVENOUS | Status: AC
  Administered 2014-10-11: 5 mg/h via INTRAVENOUS

## 2014-10-11 MED ORDER — MORPHINE SULFATE 2 MG/ML IJ SOLN
1.0000 mg | INTRAMUSCULAR | Status: DC | PRN
  Filled 2014-10-11: qty 1

## 2014-10-11 MED ORDER — ASPIRIN 325 MG PO TABS
325.0000 mg | ORAL_TABLET | Freq: Once | ORAL | Status: AC
  Administered 2014-10-11: 325 mg via ORAL
  Filled 2014-10-11: qty 1

## 2014-10-11 MED ORDER — ALBUTEROL SULFATE (2.5 MG/3ML) 0.083% IN NEBU
2.5000 mg | INHALATION_SOLUTION | Freq: Four times a day (QID) | RESPIRATORY_TRACT | Status: DC | PRN
  Administered 2014-10-12: 2.5 mg via RESPIRATORY_TRACT
  Filled 2014-10-11: qty 3

## 2014-10-11 MED ORDER — DILTIAZEM HCL 100 MG IV SOLR
5.0000 mg/h | INTRAVENOUS | Status: DC
  Administered 2014-10-11 (×2): 15 mg/h via INTRAVENOUS
  Administered 2014-10-12: 5 mg/h via INTRAVENOUS
  Filled 2014-10-11: qty 100

## 2014-10-11 MED ORDER — OFF THE BEAT BOOK
Freq: Once | Status: AC
  Administered 2014-10-11: 23:00:00
  Filled 2014-10-11: qty 1

## 2014-10-11 MED ORDER — SENNOSIDES-DOCUSATE SODIUM 8.6-50 MG PO TABS
1.0000 | ORAL_TABLET | Freq: Every day | ORAL | Status: DC | PRN
  Filled 2014-10-11: qty 1

## 2014-10-11 MED ORDER — ASPIRIN EC 81 MG PO TBEC
81.0000 mg | DELAYED_RELEASE_TABLET | Freq: Every day | ORAL | Status: DC
  Administered 2014-10-13 – 2014-10-19 (×7): 81 mg via ORAL
  Filled 2014-10-11 (×7): qty 1

## 2014-10-11 MED ORDER — TRIAMCINOLONE ACETONIDE 0.1 % EX CREA: 1.0000 "application " | TOPICAL_CREAM | Freq: Two times a day (BID) | CUTANEOUS | Status: DC

## 2014-10-11 MED ORDER — NITROGLYCERIN 0.4 MG SL SUBL: 0.4000 mg | SUBLINGUAL_TABLET | SUBLINGUAL | Status: DC | PRN

## 2014-10-11 MED ORDER — METOPROLOL TARTRATE 1 MG/ML IV SOLN: 2.5000 mg | INTRAVENOUS | Status: DC | PRN

## 2014-10-11 MED ORDER — TAMSULOSIN HCL 0.4 MG PO CAPS
0.4000 mg | ORAL_CAPSULE | Freq: Every day | ORAL | Status: DC
  Administered 2014-10-16: 0.4 mg via ORAL
  Filled 2014-10-11 (×6): qty 1

## 2014-10-11 MED ORDER — OXYCODONE-ACETAMINOPHEN 5-325 MG PO TABS
1.0000 | ORAL_TABLET | ORAL | Status: DC | PRN
Start: 2014-10-11 — End: 2014-10-19
  Administered 2014-10-11: 1 via ORAL
  Administered 2014-10-12 – 2014-10-19 (×26): 2 via ORAL
  Filled 2014-10-11 (×17): qty 2
  Filled 2014-10-11: qty 1
  Filled 2014-10-11 (×9): qty 2

## 2014-10-11 MED ORDER — STROKE: EARLY STAGES OF RECOVERY BOOK
Freq: Once | Status: AC
  Administered 2014-10-11: 19:00:00
  Filled 2014-10-11: qty 1

## 2014-10-11 MED ORDER — METOPROLOL TARTRATE 25 MG PO TABS
25.0000 mg | ORAL_TABLET | Freq: Two times a day (BID) | ORAL | Status: DC
  Administered 2014-10-11 – 2014-10-12 (×3): 25 mg via ORAL
  Filled 2014-10-11 (×3): qty 1

## 2014-10-11 MED ORDER — HEPARIN BOLUS VIA INFUSION
4000.0000 [IU] | Freq: Once | INTRAVENOUS | Status: AC
  Administered 2014-10-11: 4000 [IU] via INTRAVENOUS
  Filled 2014-10-11: qty 4000

## 2014-10-11 MED ORDER — HEPARIN (PORCINE) IN NACL 100-0.45 UNIT/ML-% IJ SOLN
1050.0000 [IU]/h | INTRAMUSCULAR | Status: DC
  Administered 2014-10-11: 850 [IU]/h via INTRAVENOUS
  Filled 2014-10-11 (×2): qty 250

## 2014-10-11 MED ORDER — BUPROPION HCL 75 MG PO TABS
75.0000 mg | ORAL_TABLET | Freq: Two times a day (BID) | ORAL | Status: DC
  Administered 2014-10-11 – 2014-10-19 (×15): 75 mg via ORAL
  Filled 2014-10-11 (×18): qty 1

## 2014-10-11 MED ORDER — DILTIAZEM HCL 25 MG/5ML IV SOLN
20.0000 mg | Freq: Once | INTRAVENOUS | Status: AC
  Administered 2014-10-11: 20 mg via INTRAVENOUS
  Filled 2014-10-11: qty 5

## 2014-10-11 NOTE — ED Notes (Signed)
Spoke with PA Samara Snide. Stated to decrease Cardizem to 10 mg/hr and give 2.5 mg of Lopressor for HR.

## 2014-10-11 NOTE — Progress Notes (Signed)
S: Checked on [name] given his medical condition. He says he has SOB with related chest tightness but no chest pain. He also has generalized weakness. He says any neurologic symptoms have resolved.  O: T 97.8 HR 90s BP 110s/70s RR 20 Sat 96% on 2L (did not drop sats on room air)  Gen: A&O x 3, resting in bed CV: irregularly irregular, no MRG Lung: diffuse rhonchi b/l, resp unlabored on 2L [name]  EKG: atrial fibrillation, new t-wave inversion in II,III, avF, V1-V6 from earlier today, ST elevation improved from earlier  CXR IMPRESSION: No active disease.  Chronic stable changes of the left lung. Evidence of asbestos related pleural disease.  A/P: [name] presented with STEMI in setting of suspected TIA. He is currently being medically managed with catheterization deferred for now by cardiology. On review of records and discussion with day team, unclear as to why cath deferred earlier today, possibly because of risk of CVA? He still denies chest pain but does have SOB with chronic cough. We reviewed case in detail with cardiology fellow in person. His repeat EKG was reviewed with fellow and shows expected progression of infarction with no worsening of ST elevation and new t-wave inversions. He recommends that if there is any clinical worsening of symptoms he will likely need catheterization. He recommended morphine but patient adamantly refuses as it gives him headache and only wants percocet already ordered. BP soft while on cardizem drip for atrial fibrillation so will hold on nitro for now but may need to give.  -appreciate cards -cont medical management including heparin gtt, metoprolol 25 mg po bid, cardizem gtt, ASA 81 mg daily -low threshold for catheterization per cardiology, precautions given to RN to notify night team if any progression of symptoms and we will notify cardiology who will then see the patient and evaluate further -cont to monitor  Lottie Mussel, MD Internal Medicine,  PGY-1 Pager [phone] 10/11/2014, 10:16 PM

## 2014-10-11 NOTE — ED Notes (Signed)
Phlebotomy at the bedside  

## 2014-10-11 NOTE — Progress Notes (Addendum)
Pt refusing to have orthostatics done, r/t having pain. Will give pain medications, and reassess. Will continue to monitor

## 2014-10-11 NOTE — ED Notes (Signed)
Patient in CT

## 2014-10-11 NOTE — Progress Notes (Signed)
  Echocardiogram 2D Echocardiogram has been performed.  Darlina Sicilian M 10/11/2014, 4:48 PM

## 2014-10-11 NOTE — ED Notes (Signed)
Pt here as a code stroke , ems called out , upon arrival EMS stated that pt had slurred speech and right side facial droop , code stroke called

## 2014-10-11 NOTE — ED Notes (Signed)
MD McClean made aware of the patient's Neuro change and HR.

## 2014-10-11 NOTE — ED Provider Notes (Signed)
CSN: [id number]     Arrival date & time 10/11/14  1252 History   First MD Initiated Contact with Patient 10/11/14 1254     Chief Complaint  Patient presents with  . Code Stroke   [name] is a 69 y.o. male with a past medical history significant for CHF, hypertension, GERD, bipolar disorder, hyperthyroidism, COPD, prior stroke, atrial fibrillation status post pacemaker, chronic kidney disease, and medical noncompliance who presents as a code stroke for aphasia. The patient is brought in by EMS who report that approximately one hour prior to arrival, the patient began having slurred speech when at a friend's house. The friend called EMS who arrived at the patient's house and found the patient to have right sided numbness in his face, right arm, and right leg as well as his slurred speech. The patient denies having residual neurological symptoms from his prior stroke. The patient denies any other symptoms specifically, no headaches, vision changes, nausea, vomiting, abdominal pain, constipation, diarrhea, dysuria.     (Consider location/radiation/quality/duration/timing/severity/associated sxs/prior Treatment) Patient is a 69 y.o. male presenting with neurologic complaint. The history is provided by the EMS personnel, medical records, the patient and a friend. No language interpreter was used.  Neurologic Problem This is a new problem. The current episode started today. The problem occurs constantly. The problem has been rapidly improving. Associated symptoms include numbness (R side). Pertinent negatives include no chest pain, chills, congestion, coughing, diaphoresis, fever, headaches, nausea, neck pain, rash, visual change, vomiting or weakness. Associated symptoms comments: Shortness of breath and aphasia. . Nothing aggravates the symptoms. He has tried nothing for the symptoms. The treatment provided no relief.    Past Medical History  Diagnosis Date  . Hypertension   . Chronic systolic  heart failure     a. NICM - EF 35% with normal cors 2003. b. Last echo 11/2012 - EF 25-30%.  . Depression   . GERD (gastroesophageal reflux disease)   . Portal vein thrombosis   . Avascular necrosis of bones of both hips     Status post bilateral hip replacements  . Gastritis   . Alcohol abuse   . Erectile dysfunction   . Bipolar disorder   . Hydrocele, unspecified   . Intermittent claudication   . Lung nodule     Chest CT scan on 12/14/2008 showed a nodular opacity at the left lung base felt to most likely represent scarring.  Follow-up chest CT scan on 06/02/2010 showed parenchymal scarring in the left apex, left  lower lobe, and lingula; some of this scarring at the left base had a nodular appearance, unchanged. Chest 09/2012 - stable.  . 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  Thyroid scan 08/28/2009 was normal with no focal areas of abnormal increased or decreased activity seen; the uptake of I 131 sodium iodide at 24 hours was 5.7%.  TSH and free T4 normalized by 08/16/2009.  . Intestinal obstruction   . COPD (chronic obstructive pulmonary disease)   . Clostridium difficile colitis   . E coli bacteremia   . DVT (deep venous thrombosis)     He has hypercoagulability with multiple prior DVTs  . Pleural plaque     H/o asbestos exposure. Chest CT on 06/02/2010 showed stable extensive calcified pleural plaques involving the left hemithorax, consistent with asbestos related pleural disease.  . Weight loss     Normal colonoscopy by Dr. Olevia Perches on 11/06/2010.  . Tachycardia-bradycardia syndrome  a. 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 b. St. Jude gen change 2010.  [name] PAF (paroxysmal atrial fibrillation)     Paroxysmal, failed medical therapy with amiodarone but has done very well with multaq;  d/c 2/2 to recurrent AFib;  Tikosyn load c/b Hyperkalemia during admx 05/2013 => rate control strategy  . Thrombosis     History of arterial and venous thrombosis  including portal vein thrombosis, deep vein thrombosis, and superior mesenteric artery thrombosis.  . Noncompliance   . Atrial flutter   . Thrombocytopenia   . Hyperkalemia     type 4 RTA  . Pacemaker   . Shortness of breath     "all the time now" (07/02/2013)  . On home oxygen therapy     "2L prn" (07/02/2013)  . Pneumonia   . Cluster headaches     "~ twice/wk" (07/02/2013)  . Osteoarthritis   . Spondylosis   . Arthritis     "hips and legs" (07/02/2013)  . Chronic back pain     "all over my back" (07/02/2013)  . Anxiety   . CKD (chronic kidney disease)     Renal U/S 12/04/2009 showed no pathological findings. Labs 12/04/2009 include normal ESR, C3, C4; neg ANA; SPEP showed nonspecific increase in the alpha-2 region with no M-spike; UPEP showed no monoclonal free light chains; urine IFE showed polyclonal increase in feree Kappa and/or free Lambda light chains. Baseline Cr reported 1.7-2.5.   Past Surgical History  Procedure Laterality Date  . Hemiarthroplasty hip Left 09/09/2002    bipolar; Dr. Lafayette Dragon  . Pacemaker insertion  02/2003    Dr. Roe Rutherford, [place] pulse generator identity SR model [id number] serial [id number]; gen change by Greggory Brandy  . Total hip arthroplasty Right 03/08/2008     By Dr. Hiram Comber  . Tee without cardioversion N/A 05/27/2013    Procedure: TRANSESOPHAGEAL ECHOCARDIOGRAM (TEE);  Surgeon: Candee Furbish, MD;  Location: [facility] ENDOSCOPY;  Service: Cardiovascular;  Laterality: N/A;  . Cardioversion N/A 05/27/2013    Procedure: CARDIOVERSION;  Surgeon: Candee Furbish, MD;  Location: [facility] ENDOSCOPY;  Service: Cardiovascular;  Laterality: N/A;  . Cardiac catheterization  2003    Nl cors, EF 35%  . Pacemaker placement  06/17/2008    Lead and generator change w/ St. Jude Medical Tendril ST model [id number] (serial  [id number]).       . Insert / replace / remove pacemaker    . Embolectomy Right 12/10/2013    Procedure: Right Femoral Embolectomy; Fasciotomy Right Lower Leg with  Intraoperative arteriogram;  Surgeon: Rosetta Posner, MD;  Location: [facility];  Service: Vascular;  Laterality: Right;  . Intraoperative arteriogram Right 12/10/2013    Procedure: INTRA OPERATIVE ARTERIOGRAM;  Surgeon: Rosetta Posner, MD;  Location: [place];  Service: Vascular;  Laterality: Right;  . Fasciotomy closure Right 12/15/2013    Procedure: LEG FASCIOTOMY CLOSURE;  Surgeon: Rosetta Posner, MD;  Location: [facility] OR;  Service: Vascular;  Laterality: Right;   Family History  Problem Relation Age of Onset  . Hypertension Mother   . Cancer Brother     Unsure of type.  . Asthma Mother   . Coronary artery disease Mother   . Colon cancer Neg Hx   . Lung cancer Neg Hx   . Prostate cancer Neg Hx    History  Substance Use Topics  . Smoking status: Former Smoker -- 1.50 packs/day for 54 years    Types: Cigarettes    Quit date: 06/08/2013  .  Smokeless tobacco: Never Used  . Alcohol Use: No     Comment: 07/02/2013 "h/o etoh abuse; stopped drinking ~ 2012"    Review of Systems  Constitutional: Negative for fever, chills, diaphoresis and appetite change.  HENT: Negative for congestion.   Eyes: Negative for photophobia and visual disturbance.  Respiratory: Positive for shortness of breath. Negative for cough, chest tightness, wheezing and stridor.   Cardiovascular: Negative for chest pain.  Gastrointestinal: Negative for nausea, vomiting, diarrhea and constipation.  Genitourinary: Negative for dysuria and flank pain.  Musculoskeletal: Negative for back pain, neck pain and neck stiffness.  Skin: Negative for rash and wound.  Neurological: Positive for speech difficulty and numbness (R side). Negative for dizziness, weakness and headaches.  Psychiatric/Behavioral: Negative for confusion and agitation.  All other systems reviewed and are negative.     Allergies  Digoxin and related and Penicillins  Home Medications   Prior to Admission medications   Medication Sig Start Date End Date Taking?  Authorizing Provider  albuterol (VENTOLIN HFA) 108 (90 BASE) MCG/ACT inhaler Inhale 2 puffs into the lungs every 6 (six) hours as needed for wheezing or shortness of breath. 08/30/14   Bertha Stakes, MD  betamethasone dipropionate (DIPROLENE) 0.05 % cream Apply to affected areas on back and arm once daily for 2 weeks.  Do not use on face or in skin folds. 08/10/14   Bertha Stakes, MD  buPROPion (WELLBUTRIN) 75 MG tablet Take 1 tablet (75 mg total) by mouth 2 (two) times daily. 06/01/14   Bertha Stakes, MD  diltiazem (CARDIZEM CD) 240 MG 24 hr capsule Take 1 capsule (240 mg total) by mouth 2 (two) times daily. 10/03/14   Thompson Grayer, MD  lactose free nutrition (BOOST PLUS) LIQD Take 237 mLs by mouth 3 (three) times daily with meals. Patient not taking: Reported on 08/10/2014 01/20/14   Langley Gauss Moding, MD  metoprolol (LOPRESSOR) 50 MG tablet Take 0.5 tablets (25 mg total) by mouth 2 (two) times daily. 06/24/14   Bertha Stakes, MD  omeprazole (PRILOSEC) 20 MG capsule Take 2 capsules (40 mg total) by mouth daily. 07/28/14   Bertha Stakes, MD  oxyCODONE-acetaminophen (PERCOCET) 10-325 MG per tablet Take 1 tablet by mouth every 4 (four) hours as needed for pain. Do not take more than 5 doses in a 24 hour period. 09/21/14   Nischal Narendra, MD  senna-docusate ([employer] SENNA PLUS) 8.6-50 MG per tablet Take 1 tablet by mouth daily as needed (for constipation). 03/24/14   Bertha Stakes, MD  sodium bicarbonate 650 MG tablet Take 2 tablets (1,300 mg total) by mouth 2 (two) times daily. 07/18/14   Bertha Stakes, MD  tamsulosin (FLOMAX) 0.4 MG CAPS capsule Take 1 capsule (0.4 mg total) by mouth daily. 08/22/14   Bertha Stakes, MD  tiotropium (SPIRIVA) 18 MCG inhalation capsule Place 1 capsule (18 mcg total) into inhaler and inhale daily. 07/28/14   Bertha Stakes, MD  triamcinolone cream (KENALOG) 0.1 % Apply 1 application topically 2 (two) times daily. Use on affected areas on chest for 2 weeks. 07/28/14   Bertha Stakes, MD  warfarin  (COUMADIN) 5 MG tablet TAKE AS DIRECTED BY THE ANTICOAGULATION CLINIC PROVIDER. 10/03/14   Sid Falcon, MD   There were no vitals taken for this visit. Physical Exam  Constitutional: He is oriented to person, place, and time. He appears well-developed and well-nourished. No distress.  HENT:  Head: Normocephalic and atraumatic.  Mouth/Throat: Oropharynx is clear and moist. No oropharyngeal exudate.  Eyes:  Conjunctivae and EOM are normal. Pupils are equal, round, and reactive to light. No scleral icterus.  Neck: Normal range of motion. Neck supple.  Cardiovascular: Normal rate, regular rhythm, normal heart sounds and intact distal pulses.   No murmur heard. Pulmonary/Chest: Effort normal and breath sounds normal. No stridor. No respiratory distress. He has no wheezes. He exhibits no tenderness.  Abdominal: Soft. Bowel sounds are normal. He exhibits no distension. There is no tenderness. There is no rebound.  Musculoskeletal: Normal range of motion. He exhibits no tenderness.  Neurological: He is alert and oriented to person, place, and time. He has normal strength. He is not disoriented. He displays normal reflexes. A cranial nerve deficit and sensory deficit is present. He exhibits normal muscle tone. Coordination normal. GCS eye subscore is 4. GCS verbal subscore is 5. GCS motor subscore is 6.  Pt has slurred speech on arrival which resolved on reassesment.   Pt also reported R face, arm, and leg numbness compared to the Left, also resolved on reassessment.   Skin: Skin is warm. He is not diaphoretic. No pallor.  Psychiatric: He has a normal mood and affect.  Nursing note and vitals reviewed.   ED Course  Procedures (including critical care time) Labs Review Labs Reviewed  CBC - Abnormal; Notable for the following:    Platelets 131 (*)    All other components within normal limits  COMPREHENSIVE METABOLIC PANEL - Abnormal; Notable for the following:    Glucose, Bld 116 (*)     Creatinine, Ser 1.68 (*)    AST 72 (*)    ALT 15 (*)    GFR calc non Af Amer 40 (*)    GFR calc Af Amer 46 (*)    All other components within normal limits  I-STAT CHEM 8, ED - Abnormal; Notable for the following:    Creatinine, Ser 1.60 (*)    Glucose, Bld 114 (*)    All other components within normal limits  I-STAT TROPOININ, ED - Abnormal; Notable for the following:    Troponin i, poc 25.83 (*)    All other components within normal limits  ETHANOL  PROTIME-INR  APTT  DIFFERENTIAL  LACTIC ACID, PLASMA  URINE RAPID DRUG SCREEN (HOSP PERFORMED)  URINALYSIS, ROUTINE W REFLEX MICROSCOPIC  HEPARIN LEVEL (UNFRACTIONATED)  TROPONIN I  TROPONIN I  TROPONIN I  HEMOGLOBIN A1C  TSH  MAGNESIUM    Imaging Review Ct Head Wo Contrast  10/11/2014   CLINICAL DATA:  Code stroke. RIGHT-sided facial droop with slurred speech.  EXAM: CT HEAD WITHOUT CONTRAST  TECHNIQUE: Contiguous axial images were obtained from the base of the skull through the vertex without intravenous contrast.  COMPARISON:  CT head 10/12/2012.  FINDINGS: No evidence for acute infarction, hemorrhage, mass lesion, hydrocephalus, or extra-axial fluid. Normal for age cerebral volume. No white matter disease. Calvarium intact. No sinus or mastoid fluid.  IMPRESSION: Negative exam.  No change from priors.  Critical Value/emergent results were called by telephone at the time of interpretation on 10/11/2014 at 1:12 pm to Dr. Janann Colonel , who verbally acknowledged these results.   Electronically Signed   By: Rolla Flatten M.D.   On: 10/11/2014 13:14   Dg Chest Portable 1 View  10/11/2014   CLINICAL DATA:  Right facial droop  EXAM: PORTABLE CHEST - 1 VIEW  COMPARISON:  01/17/2014  FINDINGS: Cardiac shadow is again mildly enlarged. A pacing device is again seen. Chronic left pleural effusion and left basilar changes are noted  although they have increased in the interval from the prior exam. The right lung remains clear. The visualized upper  abdomen is unremarkable.  IMPRESSION: Increasing chronic changes in the left lung base when compare with the prior exam. Calcified pleural plaques are again noted.   Electronically Signed   By: Inez Catalina M.D.   On: 10/11/2014 14:06     EKG Interpretation None      ED ECG REPORT   Date: 10/11/2014  Rate: 148  Rhythm: atrial fibrillation  QRS Axis: normal  Intervals: A fib  ST/T Wave abnormalities: ST elevations inferiorly  Conduction Disutrbances:none  Narrative Interpretation:   Old EKG Reviewed: changes noted  I have personally reviewed the EKG tracing and agree with the computerized printout as noted.  Noted to be a STEMI by Dr, Darl Householder on bedside review.     MDM   [name] is a 69 y.o. male with a past medical history significant for CHF, hypertension, GERD, bipolar disorder, hyperthyroidism, COPD, prior stroke, atrial fibrillation status post pacemaker, chronic kidney disease, and medical noncompliance who presents as a code stroke for aphasia. Shortly after arrival, the patient was taken to the [facility] for a code stroke images. After returning to his room, the patient had significant improvement in his speech and is able to answer further questions. The patient had improvement over approximately 10 minutes with his numbness in his right side of his body as well as his speech.  The patient reported having shortness of breath since his symptoms began but continued to deny chest pain. On initial review of the patient's EKG, ST elevations were found and his troponin was found to be 25. The patient was then quickly made a code STEMI in addition to his code stroke status. The patient was quickly started on heparin, Cardizem, and aspirin.  The neurology team did not feel that he was a candidate for thrombolytics given his resolution of his neurologic symptoms and given the discovery of his STEMI. The cardiology team evaluated the patient and will recommend further inpatient  management of his STEMI.  The patient continued to deny chest pain in the emergency department. The patient will be admitted to the internal medicine service for further management of his likely TIA and ST elevation MI.    Final diagnoses:  ST elevation myocardial infarction (STEMI), unspecified artery  Transient cerebral ischemia, unspecified transient cerebral ischemia type  Atrial fibrillation with RVR         Antony Blackbird, MD 10/11/14 1627  Wandra Arthurs, MD 10/15/14 1019  Wandra Arthurs, MD 10/15/14 1020

## 2014-10-11 NOTE — ED Notes (Signed)
MD McCLean at the bedside. Vascular Tech at the bedside.

## 2014-10-11 NOTE — Progress Notes (Signed)
As Hydrographic surveyor, I spoke with Dr. Aundra Dubin about MD request for lateral transfer from [facility] to [facility]. We are unable to transfer the patient at this time due to bed availability. Will continue to monitor patient and bed availability during the night for potential lateral transfer if bed becomes available.

## 2014-10-11 NOTE — ED Notes (Signed)
Xray at the bedside.

## 2014-10-11 NOTE — H&P (Signed)
Date: 10/11/2014               Patient Name:  [name] MRN: [id number]  DOB: November 25, 1945 Age / Sex: 69 y.o., male   PCP: Bertha Stakes, MD         Medical Service: Internal Medicine Teaching Service         Attending Physician: Dr. Sid Falcon, MD    First Contact: Dr. 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    When asked initially whey he was here he stated i dont know.  Friends are at the bedside and said they last saw him normal around 12 pm.  The patient states he doesn't remember what happened but he was driving and noticed he had trouble getting his words out, his friend noticed he was drooling, he c/o generalized weakness and trouble moving and feeling very confused.  ED also noted slurred speech, right facial droop, numbness on the right side of his body which is resolved and decreased sensation on the right side of his body.  CT head negative pt unable to get MRI due to pacemaker.    Meds: Current Facility-Administered Medications  Medication Dose Route Frequency Provider Last Rate Last Dose  .  stroke: mapping our early stages of recovery book   Does not apply Once Cresenciano Genre, MD      . albuterol (PROVENTIL) (2.5 MG/3ML) 0.083% nebulizer solution 2.5 mg  2.5 mg Inhalation Q6H PRN Cresenciano Genre, MD      . Derrill Memo ON 10/12/2014] aspirin EC tablet 81 mg  81 mg Oral  Daily Cresenciano Genre, MD      . buPROPion [facility]) tablet 75 mg  75 mg Oral BID Cresenciano Genre, MD      . diltiazem (CARDIZEM) 100 mg in dextrose 5 % 100 mL (1 mg/mL) infusion  5-20 mg/hr Intravenous Titrated Brett Canales, PA-C      . heparin ADULT infusion 100 units/mL (25000 units/250 mL)  850 Units/hr Intravenous Continuous Franky Macho, RPH 8.5 mL/hr at 10/11/14 1505 850 Units/hr at 10/11/14 1505  . metoprolol (LOPRESSOR) injection 2.5 mg  2.5 mg Intravenous Q5 min PRN Brett Canales, PA-C   2.5 mg at 10/11/14 1613  . metoprolol tartrate (LOPRESSOR) tablet 25 mg  25 mg Oral BID Brett Canales, PA-C      . morphine 2 MG/ML injection 1 mg  1 mg Intravenous Q4H PRN Cresenciano Genre, MD      . nitroGLYCERIN (NITROSTAT) SL tablet 0.4 mg  0.4 mg Sublingual Q5 min PRN Cresenciano Genre, MD      . Derrill Memo ON 10/12/2014] pantoprazole (PROTONIX) EC tablet 40 mg  40 mg Oral Daily Cresenciano Genre, MD      . senna-docusate (Senokot-S) tablet 1 tablet  1 tablet Oral Daily PRN Cresenciano Genre, MD      . tamsulosin [facility]) capsule 0.4 mg  0.4 mg Oral Daily Cresenciano Genre, MD      . Derrill Memo ON 10/12/2014] tiotropium [facility]) inhalation capsule 18 mcg  18 mcg Inhalation Daily Cresenciano Genre, MD       Medications Prior to Admission  Medication Sig Dispense Refill  . albuterol (VENTOLIN HFA) 108 (90 BASE) MCG/ACT inhaler Inhale 2 puffs into the lungs every 6 (six) hours as needed for wheezing or shortness of breath. 1 Inhaler 3  . betamethasone dipropionate (DIPROLENE) 0.05 % cream Apply to affected areas on back and arm once daily for 2 weeks.  Do not use on face or in skin folds. 45 g 0  . buPROPion (WELLBUTRIN) 75 MG tablet Take 1 tablet (75 mg total) by mouth 2 (two) times daily. 60 tablet 5  . diltiazem (CARDIZEM CD) 240 MG 24 hr capsule Take 1 capsule (240 mg total) by mouth 2 (two) times daily. 60 capsule 0  . metoprolol (LOPRESSOR) 50 MG tablet Take 0.5 tablets (25 mg total) by mouth 2 (two) times daily. 60  tablet 1  . naproxen sodium (ANAPROX) 220 MG tablet Take 220 mg by mouth 2 (two) times daily as needed (pain).    [name] omeprazole (PRILOSEC) 20 MG capsule Take 2 capsules (40 mg total) by mouth daily. 60 capsule 5  . oxyCODONE-acetaminophen (PERCOCET) 10-325 MG per tablet Take 1 tablet by mouth every 4 (four) hours as needed for pain. Do not take more than 5 doses in a 24 hour period. 150 tablet 0  . senna-docusate ([employer] SENNA PLUS) 8.6-50 MG per tablet Take 1 tablet by mouth daily as needed (for constipation). 30 tablet 1  . sodium bicarbonate 650 MG tablet Take 2 tablets (1,300 mg total) by mouth 2 (two) times daily. 120 tablet 3  . tamsulosin (FLOMAX) 0.4 MG CAPS capsule Take 1 capsule (0.4 mg total) by mouth daily. 30 capsule 3  . tiotropium (SPIRIVA) 18 MCG inhalation capsule Place 1 capsule (18 mcg total) into inhaler and inhale daily. 30 capsule 5  . triamcinolone cream (KENALOG) 0.1 % Apply 1 application topically 2 (two) times daily. Use on affected areas on chest for 2 weeks. 30 g 1  . warfarin (COUMADIN) 2.5 MG tablet Take 5-7.5 mg by mouth See admin instructions. Pt takes 28m every other day - alternates w/ 769mevery other day    . lactose free nutrition (BOOST PLUS) LIQD Take 237 mLs by mouth 3 (three) times daily with meals. (Patient not taking: Reported on 08/10/2014) 90 Can 0  . warfarin (COUMADIN) 5 MG tablet TAKE AS DIRECTED BY THE ANTICOAGULATION CLINIC PROVIDER. (Patient not taking: Reported on 10/11/2014) 50 tablet 1   Allergies: Allergies as of 10/11/2014 - Review Complete 10/11/2014  Allergen Reaction Noted  . Digoxin and related Other (See Comments) 05/17/2013  . Penicillins Rash    Past Medical History  Diagnosis Date  . Hypertension   . Chronic systolic heart failure     a. NICM - EF 35% with normal cors 2003. b. Last echo 11/2012 - EF 25-30%.  . Depression   . GERD (gastroesophageal reflux disease)   . Portal vein thrombosis   . Avascular necrosis of bones of both hips      Status post bilateral hip replacements  . Gastritis   . Alcohol abuse   . Erectile dysfunction   . Bipolar disorder   . Hydrocele, unspecified   . Intermittent claudication   . Lung nodule     Chest CT scan on 12/14/2008  showed a nodular opacity at the left lung base felt to most likely represent scarring.  Follow-up chest CT scan on 06/02/2010 showed parenchymal scarring in the left apex, left  lower lobe, and lingula; some of this scarring at the left base had a nodular appearance, unchanged. Chest 09/2012 - stable.  . 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  Thyroid scan 08/28/2009 was normal with no focal areas of abnormal increased or decreased activity seen; the uptake of I 131 sodium iodide at 24 hours was 5.7%.  TSH and free T4 normalized by 08/16/2009.  . Intestinal obstruction   . COPD (chronic obstructive pulmonary disease)   . Clostridium difficile colitis   . E coli bacteremia   . DVT (deep venous thrombosis)     He has hypercoagulability with multiple prior DVTs  . Pleural plaque     H/o asbestos exposure. Chest CT on 06/02/2010 showed stable extensive calcified pleural plaques involving the left hemithorax, consistent with asbestos related pleural disease.  . Weight loss     Normal colonoscopy by Dr. Olevia Perches on 11/06/2010.  . Tachycardia-bradycardia syndrome     a. 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 b. St. Jude gen change 2010.  [name] PAF (paroxysmal atrial fibrillation)     Paroxysmal, failed medical therapy with amiodarone but has done very well with multaq;  d/c 2/2 to recurrent AFib;  Tikosyn load c/b Hyperkalemia during admx 05/2013 => rate control strategy  . 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  . Noncompliance   . Atrial flutter   . Thrombocytopenia   . Hyperkalemia     type 4 RTA  . Pacemaker   . Shortness of breath      "all the time now" (07/02/2013)  . On home oxygen therapy     "3L prn; need it maybe 3-4 times/wk" (10/11/2014)  . Pneumonia   . Cluster headaches     "~ twice/wk" (07/02/2013)  . Osteoarthritis   . Spondylosis   . Arthritis     "hips and legs" (07/02/2013)  . Chronic back pain     "all over my back" (07/02/2013)  . Anxiety   . CKD (chronic kidney disease)     Renal U/S 12/04/2009 showed no pathological findings. Labs 12/04/2009 include normal ESR, C3, C4; neg ANA; SPEP showed nonspecific increase in the alpha-2 region with no M-spike; UPEP showed no monoclonal free light chains; urine IFE showed polyclonal increase in feree Kappa and/or free Lambda light chains. Baseline Cr reported 1.7-2.5.  . STEMI (ST elevation myocardial infarction) 10/11/2014   Past Surgical History  Procedure Laterality Date  . Hemiarthroplasty hip Left 09/09/2002    bipolar; Dr. [place] Dragon  . Pacemaker insertion  02/2003    Dr. Roe Rutherford, [place] pulse generator identity SR model [id number] serial [id number]; gen change by Greggory Brandy  . Total hip arthroplasty Right 03/08/2008     By Dr. Hiram Comber  . Tee without cardioversion N/A 05/27/2013    Procedure: TRANSESOPHAGEAL ECHOCARDIOGRAM (TEE);  Surgeon: Candee Furbish, MD;  Location: [facility] ENDOSCOPY;  Service: Cardiovascular;  Laterality: N/A;  . Cardioversion N/A 05/27/2013    Procedure: CARDIOVERSION;  Surgeon: Candee Furbish, MD;  Location: [facility] ENDOSCOPY;  Service: Cardiovascular;  Laterality: N/A;  . Cardiac catheterization  2003    Nl cors, EF 35%  . Pacemaker placement  06/17/2008    Lead and  generator change w/ St. Jude Medical Tendril ST model [id number] (serial  [id number]).       . Insert / replace / remove pacemaker    . Embolectomy Right 12/10/2013    Procedure: Right Femoral Embolectomy; Fasciotomy Right Lower Leg with Intraoperative arteriogram;  Surgeon: Rosetta Posner, MD;  Location: [place];  Service: Vascular;  Laterality: Right;  . Intraoperative arteriogram  Right 12/10/2013    Procedure: INTRA OPERATIVE ARTERIOGRAM;  Surgeon: Rosetta Posner, MD;  Location: [place];  Service: Vascular;  Laterality: Right;  . Fasciotomy closure Right 12/15/2013    Procedure: LEG FASCIOTOMY CLOSURE;  Surgeon: Rosetta Posner, MD;  Location: [facility] OR;  Service: Vascular;  Laterality: Right;   Family History  Problem Relation Age of Onset  . Hypertension Mother   . Cancer Brother     Unsure of type.  . Asthma Mother   . Coronary artery disease Mother   . Colon cancer Neg Hx   . Lung cancer Neg Hx   . Prostate cancer Neg Hx   . Stroke      mother  . Heart attack      brother and sister ?age    History   Social History  . Marital Status: Widowed    Spouse Name: N/A  . Number of Children: 10  . Years of Education: N/A   Occupational History  . RETIRED    Social History Main Topics  . Smoking status: Current Every Day Smoker -- 1.00 packs/day for 54 years    Types: Cigarettes  . Smokeless tobacco: Never Used  . Alcohol Use: 0.0 oz/week    0 Standard drinks or equivalent per week     Comment: 10/11/2014"h/o etoh abuse; stopped drinking ~ 2012"  . Drug Use: 1.00 per week    Special: Marijuana     Comment: 10/11/2014 "might smoke marijuana once/month"  . Sexual Activity: Not Currently   Other Topics Concern  . Not on file   Social History Narrative   Works as a Architect part time   Smoker 1 pack last 1.5 days tobacco, smokes marijuana    Denies EtOH x 4 years (on 10/12/12)   9 kids    From [place]   [place] Veteran    12 grade education              Review of Systems: General: Wt loss, decreased appetite x 3 months, denies fever, chills HEENT: denies h/a  Cardiac: denies chest pain, palpitations Pulm: +chronic sob Abd: denies ab pain, nausea, vomiting Ext: denies leg swelling, +chronic leg pain  Neuro: denies h/a, wears glasses    Physical Exam: Blood pressure 136/101, pulse 110, resp. rate 17, SpO2 97 %.  HR 120s-130s 93%RA, 20, 126/94  (102) -VS on exam   Vitals reviewed. General: resting in bed, NAD HEENT: PERRL b/l, [place]/at, no scleral icterus Cardiac: AF RVR, no rubs, murmurs or gallops Pulm: clear to auscultation bilaterally, no wheezes, rales, or rhonchi Abd: soft, nontender, nondistended, BS present Ext: warm and well perfused, no pedal edema Skin: clavus vs VV to left heel Neuro: alert and oriented X2 (person, place [facility], year 1916 but then states knows it is 2016), cranial nerves II-XII grossly intact, strength 5/5 all 4 extremities, neg babinski, reduced sensation to light touch right upper arm and right leg, no slurred speech noted, reduced peripheral vision on the left, nl finger to nose, heel shin, slowed finger tapping right upper arm.    Lab  results: Basic Metabolic Panel:  Recent Labs  10/11/14 1205 10/11/14 1310  NA 143 143  K 4.1 4.0  CL 109 107  CO2 24  --   GLUCOSE 116* 114*  BUN 17 20  CREATININE 1.68* 1.60*  CALCIUM 9.3  --    Liver Function Tests:  Recent Labs  10/11/14 1205  AST 72*  ALT 15*  ALKPHOS 76  BILITOT 1.2  PROT 7.2  ALBUMIN 3.7   CBC:  Recent Labs  10/11/14 1205 10/11/14 1310  WBC 7.8  --   NEUTROABS 5.4  --   HGB 14.6 16.7  HCT 43.6 49.0  MCV 90.8  --   PLT 131*  --     Coagulation:  Recent Labs  10/11/14 1205  LABPROT 15.1  INR 1.18   Urine Drug Screen: Drugs of Abuse     Component Value Date/Time   LABOPIA NONE DETECTED 10/11/2014 1724   LABOPIA NEGATIVE 12/27/2008 1943   COCAINSCRNUR NONE DETECTED 10/11/2014 1724   COCAINSCRNUR NEG 07/06/2009 2225   LABBENZ NONE DETECTED 10/11/2014 1724   LABBENZ NEG 07/06/2009 2225   AMPHETMU NONE DETECTED 10/11/2014 1724   AMPHETMU NEG 07/06/2009 2225   THCU POSITIVE* 10/11/2014 1724   LABBARB NONE DETECTED 10/11/2014 1724    Alcohol Level:  Recent Labs  10/11/14 [place] <5   Misc. Labs: Lactic acid  Trop x 3  Lipid  HA1C  Ethanol  UDS  UA  TSH Mag   Imaging results:  Ct Head Wo  Contrast  10/11/2014   CLINICAL DATA:  Code stroke. RIGHT-sided facial droop with slurred speech.  EXAM: CT HEAD WITHOUT CONTRAST  TECHNIQUE: Contiguous axial images were obtained from the base of the skull through the vertex without intravenous contrast.  COMPARISON:  CT head 10/12/2012.  FINDINGS: No evidence for acute infarction, hemorrhage, mass lesion, hydrocephalus, or extra-axial fluid. Normal for age cerebral volume. No white matter disease. Calvarium intact. No sinus or mastoid fluid.  IMPRESSION: Negative exam.  No change from priors.  Critical Value/emergent results were called by telephone at the time of interpretation on 10/11/2014 at 1:12 pm to Dr. Janann Colonel , who verbally acknowledged these results.   Electronically Signed   By: Rolla Flatten M.D.   On: 10/11/2014 13:14   Dg Chest Portable 1 View  10/11/2014   CLINICAL DATA:  Right facial droop  EXAM: PORTABLE CHEST - 1 VIEW  COMPARISON:  01/17/2014  FINDINGS: Cardiac shadow is again mildly enlarged. A pacing device is again seen. Chronic left pleural effusion and left basilar changes are noted although they have increased in the interval from the prior exam. The right lung remains clear. The visualized upper abdomen is unremarkable.  IMPRESSION: Increasing chronic changes in the left lung base when compare with the prior exam. Calcified pleural plaques are again noted.   Electronically Signed   By: Inez Catalina M.D.   On: 10/11/2014 14:06    Other results: EKG: AF 164, nl QT, no axis, STEMI, no LVH  Assessment & Plan by Problem: 69 y.o with multiple medical problems presents for right facial droop, confusion, slurred speech, numbness in extremity since 11 am found to have STEMI with troponin elevation to >25.    #Concern for STEMI (ST elevation myocardial infarction) with elevated tropnonin >25  -Cards consulted. Pt is chest pain free. ? If going to cath lab?[name]  Attempted to contact Dr. Harrington Challenger but phone # is going to VM.  Called the office who  confirmed phone lines were down for [facility] office. Awaiting to hear back  -morphine 1q4 prn, O2, prn NTG, Aspirin 325 mg x 1 to Aspirin 81 in am, BB started 25 mg bid, Lopressor 2.5 mg q5 mg prn  -Heparin gtt per pharm  -admit to cardiac CCU -trop x 3 (25.83>20.70), check UDS, echo, EKG now, prn and in the am   #Concern for stroke vs TIA -PT,OT, SLP (passed swallow bedside) ,HA1C, lipid -TTE with bubble, US carotids -Neuro checks  -Unable to get MRI due to pacer   #Chronic Atrial fibrillation with RVR now with h/o tachy/brady syndrome s/p pacer -HR was 164 down to 1teens but mostly 130s  -cards following  -Given Dilt 20 iv x 1 and placed on Dilt gtt, Lopressor 25 mg bid, prn Lopressor  -pending echo (limited due to AF with RVR per tech), pending tsh, Mag  -subtherapeutic INR 1.18 on admission N/C with Coumadin prior to admission on Hep gtt   #H/o HTN -normally with TIA/stroke would allow permissive HTN spoke with neuro who agrees to treat STEMI -monitor BP  #? AKI on CKD 3 -Cr. 1.68-1.60 on admission Cr. 1.33 on 08/10/14 with variable baseline  -will ck urine lytes  -strict i/o, daily weights   #Transaminitis  -will trend in am lfts   #Chronic combined systolic and diastolic congestive heart failure -stable-last echo 12/16/13 with EF 45-50% LA and RA severely dilated. From 2003-2015 EF has varied from 25-50% with notable grade 1 DD, diffuse HK noted on 04/2013 echo and also PAP 42 mmHG -strict i/o, daily weights   #H/o COPD  -stable resumed Spiriva, prn Albuterol  -smoking cessation smokes 1 ppd to every 2 days   #Chronic left pleural effusion noted CXR -consider repeat CXR 2 view in am   #H/o BPH -resume Flomax   #H/o GERD -Protonix 40 mg qd on Prilosec 40 mg qd   #Chronic pain  -on percocet 10-325 q4 prn held for now getting prn morphine 1 q4   #H/o depression  -resumed Wellbutrin 75 mg qd   #THC abuse  -UDS + THC on admission   #Thrombocytopenia  -plts 131 on  admission will trend CBC  #F/E/N -none  -BMET in am  -NPO for now   #DVT px -heparin gtt, scds  -will need to be back on Coumadin when able   Dispo: Disposition is deferred at this time, awaiting improvement of current medical problems. Anticipated discharge in approximately 3-5 day(s).   The patient does have a current PCP Bertha Stakes, MD) and does need an [facility] hospital follow-up appointment after discharge.  The patient does not have transportation limitations that hinder transportation to clinic appointments.  Signed: Cresenciano Genre, MD 10/11/2014, 6:34 PM

## 2014-10-11 NOTE — ED Notes (Signed)
Cardiology at the bedside.

## 2014-10-11 NOTE — ED Notes (Signed)
Neurology at the bedside when patient arrived to room. MD and PA at the bedside.

## 2014-10-11 NOTE — Progress Notes (Addendum)
ANTICOAGULATION CONSULT NOTE - Initial Consult  Pharmacy Consult for Heparin Indication: chest pain/ACS   Allergies  Allergen Reactions  . Digoxin And Related Other (See Comments)    Dig toxicity 2009  . Penicillins Rash    Patient Measurements:    Ht  75 in    Wt: 69.9 kg IBW: 85kg Heparin Dosing Weight: 70 kg  Vital Signs:    Labs:  Recent Labs  10/11/14 1205 10/11/14 1310  HGB 14.6 16.7  HCT 43.6 49.0  PLT 131*  --   APTT 28  --   LABPROT 15.1  --   INR 1.18  --   CREATININE 1.68* 1.60*    CrCl cannot be calculated (Unknown ideal weight.).   Medical History: Past Medical History  Diagnosis Date  . Hypertension   . Chronic systolic heart failure     a. NICM - EF 35% with normal cors 2003. b. Last echo 11/2012 - EF 25-30%.  . Depression   . GERD (gastroesophageal reflux disease)   . Portal vein thrombosis   . Avascular necrosis of bones of both hips     Status post bilateral hip replacements  . Gastritis   . Alcohol abuse   . Erectile dysfunction   . Bipolar disorder   . Hydrocele, unspecified   . Intermittent claudication   . Lung nodule     Chest CT scan on 12/14/2008 showed a nodular opacity at the left lung base felt to most likely represent scarring.  Follow-up chest CT scan on 06/02/2010 showed parenchymal scarring in the left apex, left  lower lobe, and lingula; some of this scarring at the left base had a nodular appearance, unchanged. Chest 09/2012 - stable.  . 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  Thyroid scan 08/28/2009 was normal with no focal areas of abnormal increased or decreased activity seen; the uptake of I 131 sodium iodide at 24 hours was 5.7%.  TSH and free T4 normalized by 08/16/2009.  . Intestinal obstruction   . COPD (chronic obstructive pulmonary disease)   . Clostridium difficile colitis   . E coli bacteremia   . DVT (deep venous thrombosis)     He has hypercoagulability with  multiple prior DVTs  . Pleural plaque     H/o asbestos exposure. Chest CT on 06/02/2010 showed stable extensive calcified pleural plaques involving the left hemithorax, consistent with asbestos related pleural disease.  . Weight loss     Normal colonoscopy by Dr. Olevia Perches on 11/06/2010.  . Tachycardia-bradycardia syndrome     a. 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 b. St. Jude gen change 2010.  [name] PAF (paroxysmal atrial fibrillation)     Paroxysmal, failed medical therapy with amiodarone but has done very well with multaq;  d/c 2/2 to recurrent AFib;  Tikosyn load c/b Hyperkalemia during admx 05/2013 => rate control strategy  . 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  . Noncompliance   . Atrial flutter   . Thrombocytopenia   . Hyperkalemia     type 4 RTA  . Pacemaker   . Shortness of breath     "all the time now" (07/02/2013)  . On home oxygen therapy     "2L prn" (07/02/2013)  . Pneumonia   . Cluster headaches     "~ twice/wk" (07/02/2013)  . Osteoarthritis   . Spondylosis   . Arthritis     "  hips and legs" (07/02/2013)  . Chronic back pain     "all over my back" (07/02/2013)  . Anxiety   . CKD (chronic kidney disease)     Renal U/S 12/04/2009 showed no pathological findings. Labs 12/04/2009 include normal ESR, C3, C4; neg ANA; SPEP showed nonspecific increase in the alpha-2 region with no M-spike; UPEP showed no monoclonal free light chains; urine IFE showed polyclonal increase in feree Kappa and/or free Lambda light chains. Baseline Cr reported 1.7-2.5.    Medications:  See electronic med rec  Assessment: 69 y.o. male admitted as possible code stroke - symptoms resolved w/in 10 min so cancelled. Spoke with Neuro - considering this TIA. Head CT neg. Not able to do MRI due to pacer. Pt on coumadin PTA for PAF, h/o DVT. Admit INR 1.18. Pt admits noncompliance. Found to have MI with  troponin 25.8. To begin heparin for ACS. Neuro ok with doing full-dose heparin with bolus for MI. Pt with CKD (est CrCl 30 ml/min). H/H ok at baseline, Plt slightly low 131 (160s ~4mo ago).  Goal of Therapy:  Heparin level 0.3-0.7 units/ml  Monitor platelets by anticoagulation protocol: Yes   Plan:  Heparin IV bolus 4000 units Heparin IV gtt at 850 units/hr F/u 8 hr heparin level Daily heparin level and CBC  CSherlon Handing PharmD, BCPS Clinical pharmacist, pager [phone]/24/2016,1:49 PM    Addendum: Initial heparin level is therapeutic at 0.40. Continue heparin at 850 units/hr and follow up AM labs.  JNena Jordan PharmD, BCPS 10/11/2014, 10:17 PM

## 2014-10-11 NOTE — Progress Notes (Signed)
Spoke with cardiology Dr. Mare Ferrari who evaluated the patient after initial consult note. Patient is without chest pain, significantly elevated troponin trending down. Cards will continue to follow the patient.  It is still unclear from initial cardiology consult note why patient did not go to cath lab after troponin in the ED >25 with EKG changes but Dr. Mare Ferrari thinks that the patient may go this admission possibly tomorrow due to this and also reduced systolic function. He recommends continue medication management for now with heparin gtt and rate control.  Cards will follow.  Aundra Dubin MD

## 2014-10-11 NOTE — ED Notes (Signed)
MD McClean made aware of pt's Troponin

## 2014-10-11 NOTE — H&P (Signed)
History and Physical  Date: 10/11/2014               [name] Name:  [name] MRN: [id number]  DOB: 03-Mar-1946 Age / Sex: 69 y.o., male   PCP: Bertha Stakes, MD         Medical Service: Internal Medicine Teaching Service         Attending Physician: Dr. Sid Falcon, MD    First Contact: Reynaldo Minium Pager: [phone]  Second Contact: Dr. Ronnald Ramp Pager: [phone]       After Hours (After 5p/  First Contact Pager: [phone]  weekends / holidays): Second Contact Pager: [phone]   Chief Complaint: slurred speech, facial droop, dysarthria  History of Present Illness: [name] is a 69 y.o. M with PMH significant for chronic A fib with RVR, recent arterial thrombus s/p endarterectomy, COPD, CKD, tobacco abuse, and poor compliance with coumadin who presents to ED as a code stroke after an episode of acute slurred speech, drooling, numbness, and dysarthria. [name] reports driving his car with a friend as passenger and suddenly felt weak and confused. He reported word finding difficulty and was unable to get out of Travis car because of trouble with coordination. He reports that his friend told him he had slurred speech and was drooling. He also endorses R sided weakness in face, UE, LE. He denies h/o similar episodes. He denies chest pain, palpitations, abdominal pain, fever/chills, nausea/vomiting or SOB above his baseline SOB.  [name] also reports about 3 months of decreased appetite and poor PO intake. He is unsure if he has lost weight.  [name] had R facial droop and slurred speech in ED, which resolved. CT was negative for bleed. PoC troponin was 25.83. In Travis ED he received ASA 325, diltiazem 20 mg IV before starting dilt drip, heparin bolus before starting heparin drip, and metoprolol 2.5 mg. At Travis time of interview, [name] reported some dizziness and SOB.  Meds: Current Facility-Administered Medications  Medication Dose Route Frequency Provider Last Rate Last Dose   . heparin ADULT infusion 100 units/mL (25000 units/250 mL)  850 Units/hr Intravenous Continuous Franky Macho, RPH 8.5 mL/hr at 10/11/14 1505 850 Units/hr at 10/11/14 1505  . metoprolol (LOPRESSOR) injection 2.5 mg  2.5 mg Intravenous Q5 min PRN Brett Canales, PA-C   2.5 mg at 10/11/14 1613    Allergies: Allergies as of 10/11/2014 - Review Complete 10/11/2014  Allergen Reaction Noted  . Digoxin and related Other (See Comments) 05/17/2013  . Penicillins Rash    Past Medical History  Diagnosis Date  . Hypertension   . Chronic systolic heart failure     a. NICM - EF 35% with normal cors 2003. b. Last echo 11/2012 - EF 25-30%.  . Depression   . GERD (gastroesophageal reflux disease)   . Portal vein thrombosis   . Avascular necrosis of bones of both hips     Status post bilateral hip replacements  . Gastritis   . Alcohol abuse   . Erectile dysfunction   . Bipolar disorder   . Hydrocele, unspecified   . Intermittent claudication   . Lung nodule     Chest CT scan on 12/14/2008 showed a nodular opacity at Travis left lung base felt to most likely represent scarring.  Follow-up chest CT scan on 06/02/2010 showed parenchymal scarring in Travis left apex, left  lower lobe, and lingula; some of this scarring at Travis left base had a nodular appearance,  unchanged. Chest 09/2012 - stable.  . 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  Thyroid scan 08/28/2009 was normal with no focal areas of abnormal increased or decreased activity seen; Travis uptake of I 131 sodium iodide at 24 hours was 5.7%.  TSH and free T4 normalized by 08/16/2009.  . Intestinal obstruction   . COPD (chronic obstructive pulmonary disease)   . Clostridium difficile colitis   . E coli bacteremia   . DVT (deep venous thrombosis)     He has hypercoagulability with multiple prior DVTs  . Pleural plaque     H/o asbestos exposure. Chest CT on 06/02/2010 showed stable extensive calcified pleural  plaques involving Travis left hemithorax, consistent with asbestos related pleural disease.  . Weight loss     Normal colonoscopy by Dr. Olevia Perches on 11/06/2010.  . Tachycardia-bradycardia syndrome     a. 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 b. St. Jude gen change 2010.  [name] PAF (paroxysmal atrial fibrillation)     Paroxysmal, failed medical therapy with amiodarone but has done very well with multaq;  d/c 2/2 to recurrent AFib;  Tikosyn load c/b Hyperkalemia during admx 05/2013 => rate control strategy  . 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  . Noncompliance   . Atrial flutter   . Thrombocytopenia   . Hyperkalemia     type 4 RTA  . Pacemaker   . Shortness of breath     "all Travis time now" (07/02/2013)  . On home oxygen therapy     "2L prn" (07/02/2013)  . Pneumonia   . Cluster headaches     "~ twice/wk" (07/02/2013)  . Osteoarthritis   . Spondylosis   . Arthritis     "hips and legs" (07/02/2013)  . Chronic back pain     "all over my back" (07/02/2013)  . Anxiety   . CKD (chronic kidney disease)     Renal U/S 12/04/2009 showed no pathological findings. Labs 12/04/2009 include normal ESR, C3, C4; neg ANA; SPEP showed nonspecific increase in Travis alpha-2 region with no M-spike; UPEP showed no monoclonal free light chains; urine IFE showed polyclonal increase in feree Kappa and/or free Lambda light chains. Baseline Cr reported 1.7-2.5.   Past Surgical History  Procedure Laterality Date  . Hemiarthroplasty hip Left 09/09/2002    bipolar; Dr. [place] Dragon  . Pacemaker insertion  02/2003    Dr. Roe Rutherford, [place] pulse generator identity SR model [id number] serial [id number]; gen change by Greggory Brandy  . Total hip arthroplasty Right 03/08/2008     By Dr. Hiram Comber  . Tee without cardioversion N/A 05/27/2013    Procedure: TRANSESOPHAGEAL ECHOCARDIOGRAM (TEE);  Surgeon: Candee Furbish, MD;  Location: [facility]  ENDOSCOPY;  Service: Cardiovascular;  Laterality: N/A;  . Cardioversion N/A 05/27/2013    Procedure: CARDIOVERSION;  Surgeon: Candee Furbish, MD;  Location: [facility] ENDOSCOPY;  Service: Cardiovascular;  Laterality: N/A;  . Cardiac catheterization  2003    Nl cors, EF 35%  . Pacemaker placement  06/17/2008    Lead and generator change w/ St. Jude Medical Tendril ST model [id number] (serial  [id number]).       . Insert / replace / remove pacemaker    . Embolectomy Right 12/10/2013    Procedure: Right Femoral Embolectomy; Fasciotomy Right Lower Leg with Intraoperative arteriogram;  Surgeon: Rosetta Posner, MD;  Location: [place];  Service:  Vascular;  Laterality: Right;  . Intraoperative arteriogram Right 12/10/2013    Procedure: INTRA OPERATIVE ARTERIOGRAM;  Surgeon: Rosetta Posner, MD;  Location: [place];  Service: Vascular;  Laterality: Right;  . Fasciotomy closure Right 12/15/2013    Procedure: LEG FASCIOTOMY CLOSURE;  Surgeon: Rosetta Posner, MD;  Location: [facility] OR;  Service: Vascular;  Laterality: Right;   Family History  Problem Relation Age of Onset  . Hypertension Mother   . Cancer Brother     Unsure of type.  . Asthma Mother   . Coronary artery disease Mother   . Colon cancer Neg Hx   . Lung cancer Neg Hx   . Prostate cancer Neg Hx    History   Social History  . Marital Status: Widowed    Spouse Name: N/A  . Number of Children: 10  . Years of Education: N/A   Occupational History  . RETIRED    Social History Main Topics  . Smoking status: Former Smoker -- 1.50 packs/day for 54 years    Types: Cigarettes    Quit date: 06/08/2013  . Smokeless tobacco: Never Used  . Alcohol Use: No     Comment: 07/02/2013 "h/o etoh abuse; stopped drinking ~ 2012"  . Drug Use: No     Comment: 07/02/2013 "stopped marijuana   . Sexual Activity: Not Currently   Other Topics Concern  . Not on file   Social History Narrative   Works as a Architect part time   Smoker 1 pack last 1.5 days tobacco, smokes  marijuana    Denies EtOH x 4 years (on 10/12/12)   12 kids    From [place]   [place] Veteran    12 grade education              Review of Systems: Pertinent items are noted in HPI.  Physical Exam: Blood pressure 109/85, pulse 114, resp. rate 15, SpO2 96 %. BP 109/85 mmHg  Pulse 114  Resp 15  SpO2 96%  General Appearance:    Calm, cooperative, intermittently closing eyes but able to follow conversation. Lying in bed comfortably. Cachectic.  Head:    Normocephalic, without obvious abnormality, atraumatic. Temporal wasting  Eyes:    PERRL, conjunctiva/corneas clear, EOM's intact       Throat:   Lips, mucosa, and tongue normal; gums normal; adentulate  Lungs:     Clear to auscultation over bilateral anterior fields, respirations unlabored  Heart:    Tachycardic, irregularly irregular, no murmur, rub   or gallop  Abdomen:     Soft, non-tender, normoactive bowel sounds  Extremities:   No cyanosis or edema.  Pulses:   2+ bilateral radial pulses. 2+ L dorsal pedal pulse. RLE cold to touch with thready dorsal pedal pulse  Skin:   2 cm verrucous lesion on L heel  Neurologic: Alert, oriented x 3 (initially replied "1916" to year then corrected with prompting)   PERRL, EOMI. Sensation to light touch full and equal in all 3 trigeminal distributions. Symmetric smile, cheek puff, eye closure, tongue protrusion. Full symmetric tongue movement. Symmetric full strength to head turn, shrug. 5/5 BUE, BLE strength Diminished sensation to light touch in RUE and RLE No ataxia on heel-to-shin or finger-to-nose. Mild R ataxia on rapid alternating movements.    Lab results: Basic Metabolic Panel:  Recent Labs  10/11/14 1205 10/11/14 1310  NA 143 143  K 4.1 4.0  CL 109 107  CO2 24  --   GLUCOSE  116* 114*  BUN 17 20  CREATININE 1.68* 1.60*  CALCIUM 9.3  --    Liver Function Tests:  Recent Labs  10/11/14 1205  AST 72*  ALT 15*  ALKPHOS 76  BILITOT 1.2  PROT 7.2  ALBUMIN 3.7    CBC:  Recent Labs  10/11/14 1205 10/11/14 1310  WBC 7.8  --   NEUTROABS 5.4  --   HGB 14.6 16.7  HCT 43.6 49.0  MCV 90.8  --   PLT 131*  --    Cardiac Enzymes:  Recent Labs  10/11/14 1526  TROPONINI 20.70*   Coagulation:  Recent Labs  10/11/14 1205  LABPROT 15.1  INR 1.18   Urine Drug Screen: Drugs of Abuse     Component Value Date/Time   LABOPIA NONE DETECTED 01/17/2014 2208   LABOPIA NEGATIVE 12/27/2008 1943   COCAINSCRNUR NONE DETECTED 01/17/2014 2208   COCAINSCRNUR NEG 07/06/2009 2225   LABBENZ NONE DETECTED 01/17/2014 2208   LABBENZ NEG 07/06/2009 2225   AMPHETMU NONE DETECTED 01/17/2014 2208   AMPHETMU NEG 07/06/2009 2225   THCU POSITIVE* 01/17/2014 2208   LABBARB NONE DETECTED 01/17/2014 2208    Alcohol Level:  Recent Labs  10/11/14 1305  ETH <5   Imaging results:  Ct Head Wo Contrast  10/11/2014   CLINICAL DATA:  Code stroke. RIGHT-sided facial droop with slurred speech.  EXAM: CT HEAD WITHOUT CONTRAST  TECHNIQUE: Contiguous axial images were obtained from Travis base of Travis skull through Travis vertex without intravenous contrast.  COMPARISON:  CT head 10/12/2012.  FINDINGS: No evidence for acute infarction, hemorrhage, mass lesion, hydrocephalus, or extra-axial fluid. Normal for age cerebral volume. No white matter disease. Calvarium intact. No sinus or mastoid fluid.  IMPRESSION: Negative exam.  No change from priors.  Critical Value/emergent results were called by telephone at Travis time of interpretation on 10/11/2014 at 1:12 pm to Dr. Janann Colonel , who verbally acknowledged these results.   Electronically Signed   By: Rolla Flatten M.D.   On: 10/11/2014 13:14   Dg Chest Portable 1 View  10/11/2014   CLINICAL DATA:  Right facial droop  EXAM: PORTABLE CHEST - 1 VIEW  COMPARISON:  01/17/2014  FINDINGS: Cardiac shadow is again mildly enlarged. A pacing device is again seen. Chronic left pleural effusion and left basilar changes are noted although they have  increased in Travis interval from Travis prior exam. Travis right lung remains clear. Travis visualized upper abdomen is unremarkable.  IMPRESSION: Increasing chronic changes in Travis left lung base when compare with Travis prior exam. Calcified pleural plaques are again noted.   Electronically Signed   By: Inez Catalina M.D.   On: 10/11/2014 14:06    Other results: EKG: anterior infarct with ST elevation in V3, V4, aVL  Assessment & Plan by Problem: Principal Problem:   STEMI (ST elevation myocardial infarction) Active Problems:   Essential hypertension   Chronic kidney disease   Chronic combined systolic and diastolic congestive heart failure   Weight loss   Atrial fibrillation, chronic   Atrial fibrillation with RVR   TIA (transient ischemic attack) [name] is a 69 y.o. M PMH A fib with RVR, COPD, CKD, HTN, poor compliance on coumadin p/w acute slurred speech, R dysarthria/numbness, trop 25.83 in ED with new ST elevation.  STEMI: Chronic A fib with RVR, poor coumadin compliance, 81 pack-year smoking hx. No CP, PoC Troponin 25.83. ECG with ST elevation in leads V4, V5, aVL. EF 45-50% on 12/16/2013 with diffuse  LV hypokinesis - previously EF 20-25% on 12/07/2012. - Cardiology consulted, appreciate recs - Pt admitted to CCU - trend troponin - morphine 1 mg q4hr PRN - O2 via [place] for SpO2 > 90% - nitro 0.4 mg SL PRN - s/p ASA 325 in ED. Start ASA 81 daily tomorrow 5/25 - s/p IV metoprolol in ED. Start metoprolol 25 mg PO BID - Per cards, heparin gtt, diltiazem gtt  Stroke/TIA: Slurred speech, R facial droop, dysarthria. Likely thromboembolic. A fib with RVR, previous known RA thrombus, poor coumadin compliance, 81 pack-year smoking hx, HTN. Possible decreased RUE/RLE sensation, mild possible R dysmetria with rapid alternating movements - otherwise all FND resolved. - Pt has passed bedside swallow - OK for PO - Neuro consulted, appreciate recs - Hgb A1c, fasting lipid panel - TTE - carotid dopplers -  Neuro checks - PT/OT/SLP  AKI on CKD: Serum Cr up to 1.68 on admission from baseline 1.33. Possible cardiogenic prerenal state 2/2 acute STEMI. Unclear CKD etiology, negative w/u in 11/2009. - UCr, UNa for FENa   HTN: Previously on metoprolol 50 mg daily, diltiazem 240 mg daily. - metoprolol, dilt as above for ACS mgmt  Transaminitis: AST 72, ALT 15 on admission. May be 2/2 to temporary hypotension in Travis setting of ACS. - Trend LFTs  COPD: baseline SOB, 81 pack-year smoking hx. PFTs 2012: FEV1/FVC 71%, FEV1 78% predicted. - Continue albuterol nebs PRN  Depression: Continue bupropion 75 mg BID  Weight Loss: Pt reports about 3 months of decreased appetite with poor PO intake. Potentially due to a combination of COPD, depression, chronic pain with opioid use. Normal colonoscopy on 10/27/2010. PSA 07/28/2014 stable at 0.49. - Encourage PO after SLP swallow eval  F: None E: BMP WNL, Mg pending given ACS. Replenish PRN. N: NPO sips with meds pending SLP eval. GI: Protonix 40 mg daily  PPX: Pt on heparin gtt for ACS.  Dispo: Disposition is deferred at this time, awaiting improvement of current medical problems. Anticipated discharge in approximately 2-3 day(s).   [name] does have a current PCP Bertha Stakes, MD) and does need an [facility] hospital follow-up appointment after discharge.  [name] does not have transportation limitations that hinder transportation to clinic appointments.  Signed: Susa Day, Med Student 10/11/2014, 5:22 PM

## 2014-10-11 NOTE — Consult Note (Signed)
Referring Physician: Darl Householder    Chief Complaint: Code stroke  HPI:                                                                                                                                         [name] is an 69 y.o. male male with multiple medical problems including PAF, DVT on chronic coumadin, HTN, and CKD.  Today he was driving to his friends house to get his car fixed.  At 11:15 he noted he could not get his words out correctly and he had a right facial droop. EMS was called and he was brought to [facility] as code stroke. On arrival he had a right facial droop and had sever dysarthria. HE had a HR 190 in Afib on arrival.  Patient was placed supine and HR slowed to 150 while in CT.  10 minutes later his symptoms fully resolved. He admits to being poorly compliant with his coumadin.   While in ED he was noted to have a EKG consistent with MI and Troponin 25.   Date last known well: Date: 10/11/2014 Time last known well: Time: 11:15 tPA Given: No: symptoms resolved  Modified Rankin: Rankin Score=0    Past Medical History  Diagnosis Date  . Hypertension   . Chronic systolic heart failure     a. NICM - EF 35% with normal cors 2003. b. Last echo 11/2012 - EF 25-30%.  . Depression   . GERD (gastroesophageal reflux disease)   . Portal vein thrombosis   . Avascular necrosis of bones of both hips     Status post bilateral hip replacements  . Gastritis   . Alcohol abuse   . Erectile dysfunction   . Bipolar disorder   . Hydrocele, unspecified   . Intermittent claudication   . Lung nodule     Chest CT scan on 12/14/2008 showed a nodular opacity at the left lung base felt to most likely represent scarring.  Follow-up chest CT scan on 06/02/2010 showed parenchymal scarring in the left apex, left  lower lobe, and lingula; some of this scarring at the left base had a nodular appearance, unchanged. Chest 09/2012 - stable.  . Hyperthyroidism     Likely due to thyroiditis with possible  amiodarone association.  Thyroid scan 08/28/2009 was normal with no focal areas of abnormal increased or decreased activity seen; the uptake of I 131 sodium iodide at 24 hours was 5.7%.  TSH and free T4 normalized by 08/16/2009.  . Intestinal obstruction   . COPD (chronic obstructive pulmonary disease)   . Clostridium difficile colitis   . E coli bacteremia   . DVT (deep venous thrombosis)     He has hypercoagulability with multiple prior DVTs  . Pleural plaque     H/o asbestos exposure. Chest CT on 06/02/2010 showed stable extensive calcified pleural plaques involving the left hemithorax, consistent with asbestos  related pleural disease.  . Weight loss     Normal colonoscopy by Dr. Olevia Perches on 11/06/2010.  . Tachycardia-bradycardia syndrome     a. 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 b. St. Jude gen change 2010.  [name] PAF (paroxysmal atrial fibrillation)     Paroxysmal, failed medical therapy with amiodarone but has done very well with multaq;  d/c 2/2 to recurrent AFib;  Tikosyn load c/b Hyperkalemia during admx 05/2013 => rate control strategy  . 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  . Noncompliance   . Atrial flutter   . Thrombocytopenia   . Hyperkalemia     type 4 RTA  . Pacemaker   . Shortness of breath     "all the time now" (07/02/2013)  . On home oxygen therapy     "2L prn" (07/02/2013)  . Pneumonia   . Cluster headaches     "~ twice/wk" (07/02/2013)  . Osteoarthritis   . Spondylosis   . Arthritis     "hips and legs" (07/02/2013)  . Chronic back pain     "all over my back" (07/02/2013)  . Anxiety   . CKD (chronic kidney disease)     Renal U/S 12/04/2009 showed no pathological findings. Labs 12/04/2009 include normal ESR, C3, C4; neg ANA; SPEP showed nonspecific increase in the alpha-2 region with no M-spike; UPEP showed no monoclonal free light chains; urine IFE showed polyclonal  increase in feree Kappa and/or free Lambda light chains. Baseline Cr reported 1.7-2.5.    Past Surgical History  Procedure Laterality Date  . Hemiarthroplasty hip Left 09/09/2002    bipolar; Dr. [place] Dragon  . Pacemaker insertion  02/2003    Dr. Roe Rutherford, [place] pulse generator identity SR model [id number] serial [id number]; gen change by Greggory Brandy  . Total hip arthroplasty Right 03/08/2008     By Dr. Hiram Comber  . Tee without cardioversion N/A 05/27/2013    Procedure: TRANSESOPHAGEAL ECHOCARDIOGRAM (TEE);  Surgeon: Candee Furbish, MD;  Location: [facility] END[facility]COPY;  Service: Cardiovascular;  Laterality: N/A;  . Cardioversion N/A 05/27/2013    Procedure: CARDIOVERSION;  Surgeon: Candee Furbish, MD;  Location: [facility] ENDOSCOPY;  Service: Cardiovascular;  Laterality: N/A;  . Cardiac catheterization  2003    Nl cors, EF 35%  . Pacemaker placement  06/17/2008    Lead and generator change w/ St. Jude Medical Tendril ST model [id number] (serial  [id number]).       . Insert / replace / remove pacemaker    . Embolectomy Right 12/10/2013    Procedure: Right Femoral Embolectomy; Fasciotomy Right Lower Leg with Intraoperative arteriogram;  Surgeon: Rosetta Posner, MD;  Location: [place];  Service: Vascular;  Laterality: Right;  . Intraoperative arteriogram Right 12/10/2013    Procedure: INTRA OPERATIVE ARTERIOGRAM;  Surgeon: Rosetta Posner, MD;  Location: [place];  Service: Vascular;  Laterality: Right;  . Fasciotomy closure Right 12/15/2013    Procedure: LEG FASCIOTOMY CLOSURE;  Surgeon: Rosetta Posner, MD;  Location: [facility] OR;  Service: Vascular;  Laterality: Right;    Family History  Problem Relation Age of Onset  . Hypertension Mother   . Cancer Brother     Unsure of type.  . Asthma Mother   . Coronary artery disease Mother   . Colon cancer Neg Hx   . Lung cancer Neg Hx   . Prostate cancer Neg Hx    Social History:  reports that he quit smoking about 16 months ago. His smoking use included Cigarettes. He has a 81  pack-year smoking history. He has never used smokeless tobacco. He reports that he does not drink alcohol or use illicit drugs.  Allergies:  Allergies  Allergen Reactions  . Digoxin And Related Other (See Comments)    Dig toxicity 2009  . Penicillins Rash    Medications:                                                                                                                           No current facility-administered medications for this encounter.   Current Outpatient Prescriptions  Medication Sig Dispense Refill  . albuterol (VENTOLIN HFA) 108 (90 BASE) MCG/ACT inhaler Inhale 2 puffs into the lungs every 6 (six) hours as needed for wheezing or shortness of breath. 1 Inhaler 3  . betamethasone dipropionate (DIPROLENE) 0.05 % cream Apply to affected areas on back and arm once daily for 2 weeks.  Do not use on face or in skin folds. 45 g 0  . buPROPion (WELLBUTRIN) 75 MG tablet Take 1 tablet (75 mg total) by mouth 2 (two) times daily. 60 tablet 5  . diltiazem (CARDIZEM CD) 240 MG 24 hr capsule Take 1 capsule (240 mg total) by mouth 2 (two) times daily. 60 capsule 0  . lactose free nutrition (BOOST PLUS) LIQD Take 237 mLs by mouth 3 (three) times daily with meals. (Patient not taking: Reported on 08/10/2014) 90 Can 0  . metoprolol (LOPRESSOR) 50 MG tablet Take 0.5 tablets (25 mg total) by mouth 2 (two) times daily. 60 tablet 1  . omeprazole (PRILOSEC) 20 MG capsule Take 2 capsules (40 mg total) by mouth daily. 60 capsule 5  . oxyCODONE-acetaminophen (PERCOCET) 10-325 MG per tablet Take 1 tablet by mouth every 4 (four) hours as needed for pain. Do not take more than 5 doses in a 24 hour period. 150 tablet 0  . senna-docusate ([employer] SENNA PLUS) 8.6-50 MG per tablet Take 1 tablet by mouth daily as needed (for constipation). 30 tablet 1  . sodium bicarbonate 650 MG tablet Take 2 tablets (1,300 mg total) by mouth 2 (two) times daily. 120 tablet 3  . tamsulosin (FLOMAX) 0.4 MG CAPS capsule Take  1 capsule (0.4 mg total) by mouth daily. 30 capsule 3  . tiotropium (SPIRIVA) 18 MCG inhalation capsule Place 1 capsule (18 mcg total) into inhaler and inhale daily. 30 capsule 5  . triamcinolone cream (KENALOG) 0.1 % Apply 1 application topically 2 (two) times daily. Use on affected areas on chest for 2 weeks. 30 g 1  . warfarin (COUMADIN) 5 MG tablet TAKE AS DIRECTED BY THE ANTICOAGULATION CLINIC PROVIDER. 50 tablet 1     ROS:  History obtained from the patient  General ROS: negative for - chills, fatigue, fever, night sweats, weight gain or weight loss Psychological ROS: negative for - behavioral disorder, hallucinations, memory difficulties, mood swings or suicidal ideation Ophthalmic ROS: negative for - blurry vision, double vision, eye pain or loss of vision ENT ROS: negative for - epistaxis, nasal discharge, oral lesions, sore throat, tinnitus or vertigo Allergy and Immunology ROS: negative for - hives or itchy/watery eyes Hematological and Lymphatic ROS: negative for - bleeding problems, bruising or swollen lymph nodes Endocrine ROS: negative for - galactorrhea, hair pattern changes, polydipsia/polyuria or temperature intolerance Respiratory ROS: negative for - cough, hemoptysis, shortness of breath or wheezing Cardiovascular ROS: negative for - chest pain, dyspnea on exertion, edema or irregular heartbeat Gastrointestinal ROS: negative for - abdominal pain, diarrhea, hematemesis, nausea/vomiting or stool incontinence Genito-Urinary ROS: negative for - dysuria, hematuria, incontinence or urinary frequency/urgency Musculoskeletal ROS: negative for - joint swelling or muscular weakness Neurological ROS: as noted in HPI Dermatological ROS: negative for rash and skin lesion changes  Neurologic Examination:                                                                                                       There were no vitals taken for this visit.  HEENT-  Normocephalic, no lesions, without obvious abnormality.  Normal external eye and conjunctiva.  Normal TM's bilaterally.  Normal auditory canals and external ears. Normal external nose, mucus membranes and septum.  Normal pharynx. Cardiovascular- irregularly irregular rhythm, pulses palpable throughout   Lungs- chest clear, no wheezing, rales, normal symmetric air entry Abdomen- normal findings: bowel sounds normal Extremities- no edema Lymph-no adenopathy palpable Musculoskeletal-no joint tenderness, deformity or swelling Skin-warm and dry, no hyperpigmentation, vitiligo, or suspicious lesions  Neurological Examination Mental Status: Alert, oriented, thought content appropriate.  Speech dysarthric without evidence of aphasia.  Able to follow 3 step commands without difficulty. Cranial Nerves: II: Discs flat bilaterally; Visual fields grossly normal, pupils equal, round, reactive to light and accommodation III,IV, VI: ptosis not present, extra-ocular motions intact bilaterally V,VII: smile symmetric, facial light touch sensation normal bilaterally VIII: hearing normal bilaterally IX,X: uvula rises symmetrically XI: bilateral shoulder shrug XII: midline tongue extension Motor: Right : Upper extremity   5/5    Left:     Upper extremity   5/5  Lower extremity   5/5     Lower extremity   5/5 Tone and bulk:normal tone throughout; no atrophy noted Sensory: Pinprick and light touch intact throughout, bilaterally Deep Tendon Reflexes: 2+ and symmetric throughout Plantars: Right: downgoing   Left: downgoing Cerebellar: normal finger-to-nose, normal rapid alternating movements and normal heel-to-shin test Gait: not tested due to safety       Lab Results: Basic Metabolic Panel:  Recent Labs Lab 10/11/14 1310  NA 143  K 4.0  CL 107  GLUCOSE 114*  BUN 20  CREATININE 1.60*     Liver Function Tests: No results for input(s): AST, ALT, ALKPHOS, BILITOT, PROT, ALBUMIN in the last 168 hours. No results for input(s): LIPASE, AMYLASE in the  last 168 hours. No results for input(s): AMMONIA in the last 168 hours.  CBC:  Recent Labs Lab 10/11/14 1205 10/11/14 1310  WBC 7.8  --   NEUTROABS 5.4  --   HGB 14.6 16.7  HCT 43.6 49.0  MCV 90.8  --   PLT 131*  --     Cardiac Enzymes: No results for input(s): CKTOTAL, CKMB, CKMBINDEX, TROPONINI in the last 168 hours.  Lipid Panel: No results for input(s): CHOL, TRIG, HDL, CHOLHDL, VLDL, LDLCALC in the last 168 hours.  CBG: No results for input(s): GLUCAP in the last 168 hours.  Microbiology: Results for orders placed or performed during the hospital encounter of 01/17/14  MRSA PCR Screening     Status: None   Collection Time: 01/17/14  6:26 PM  Result Value Ref Range Status   MRSA by PCR NEGATIVE NEGATIVE Final    Comment:        The GeneXpert MRSA Assay (FDA approved for NASAL specimens only), is one component of a comprehensive MRSA colonization surveillance program. It is not intended to diagnose MRSA infection nor to guide or monitor treatment for MRSA infections.  Culture, Urine     Status: None   Collection Time: 01/17/14 10:08 PM  Result Value Ref Range Status   Specimen Description URINE, CLEAN CATCH  Final   Special Requests NONE  Final   Culture  Setup Time   Final    01/17/2014 23:01 Performed at [place]   Final    >=100,000 COLONIES/ML Performed at [facility]   Culture   Final    ESCHERICHIA COLI Performed at [facility]   Report Status 01/20/2014 FINAL  Final   Organism ID, Bacteria ESCHERICHIA COLI  Final      Susceptibility   Escherichia coli - MIC*    AMPICILLIN <=2 SENSITIVE Sensitive     CEFAZOLIN <=4 SENSITIVE Sensitive     CEFTRIAXONE <=1 SENSITIVE Sensitive     CIPROFLOXACIN <=0.25 SENSITIVE Sensitive     GENTAMICIN <=1  SENSITIVE Sensitive     LEVOFLOXACIN <=0.12 SENSITIVE Sensitive     NITROFURANTOIN <=16 SENSITIVE Sensitive     TOBRAMYCIN <=1 SENSITIVE Sensitive     TRIMETH/SULFA <=20 SENSITIVE Sensitive     PIP/TAZO <=4 SENSITIVE Sensitive     * ESCHERICHIA COLI   *Note: Due to a large number of results and/or encounters for the requested time period, some results have not been displayed. A complete set of results can be found in Results Review.    Coagulation Studies: No results for input(s): LABPROT, INR in the last 72 hours.  Imaging: No results found.     Assessment and plan discussed with with attending physician and they are in agreement.    Etta Quill PA-C [facility] [phone]  10/11/2014, 1:15 PM   Assessment: 70 y.o. male presenting to ED with right facial droop and dysarthria.  These symptoms fully cleared while in CT.  Exam is non-focal.  While in ED it was noted he had a EKG consistent with MI and troponin of 25.  Code stroke was canceled. Due to Pacer he will not be able to have MRI. Will need TIA workup.   Stroke Risk Factors - atrial fibrillation and hypertension  Recommend 1. HgbA1c, fasting lipid panel 2. PT consult, OT consult, Speech consult 3. Echocardiogram 4. Carotid dopplers 5. Prophylactic therapy-INR pending--coumadin per pharmacy.  6. Risk factor modification 7. Telemetry monitoring 8. Frequent neuro checks  9 NPO until passes stroke swallow screen  Jim Like, DO [facility] [phone]  If 7pm- 7am, please page neurology on call as listed in [place].

## 2014-10-11 NOTE — ED Notes (Signed)
MD Darl Householder given EKG. Patient denies any chest pain.

## 2014-10-11 NOTE — Consult Note (Addendum)
Reason for Consult: STEMI Referring Physician: ER  [name] is an 69 y.o. male.  HPI:   [name] is a 69 y.o. male with a past medical history significant for nonischemic cardiomyopathy, tachy-brady syndrome (s/p STJ VVI pacemaker), recurrent DVT and recent arterial thrombus (Supposed to be on Warfarin), COPD, tobacco abuse and CKD. He has failed medical therapy for atrial fibrillation with multaq, amiodarone, and tikoysn. He was admitted (7/24-12/20/13) for RLE arterial thrombosis and underwent embolectomy and fasciotomy at that time. He was discharged to home and did well for several days but then developed anorexia and orthostatic dizziness. He presented to [facility] for further evaluation on 01/18/14. He was found to be in coarse atrial fibrillation (ventricular rates 100-140's). Home medications for rate control include Metoprolol 7m twice daily and Diltiazem 246mdaily at home.   He missed his appt in Feb. With Dr. AlRayann Heman  He presented as a code stroke with complaints of extreme weakness, slurred speech, SOB, dizziness, congestion.  He was unable to get out of his car at his friend's house.  His friend said he was drooling profusely.  He denies chest pain, nausea, vomiting, fever, orthopnea, dizziness, PND, congestion, abdominal pain, hematochezia, melena, lower extremity edema, claudication.  He had a right facial droop with slurred speech when he arrived and it resolved while at CT.  CT was negative for anything acute.     Past Medical History  Diagnosis Date  . Hypertension   . Chronic systolic heart failure     a. NICM - EF 35% with normal cors 2003. b. Last echo 11/2012 - EF 25-30%.  . Depression   . GERD (gastroesophageal reflux disease)   . Portal vein thrombosis   . Avascular necrosis of bones of both hips     Status post bilateral hip replacements  . Gastritis   . Alcohol abuse   . Erectile dysfunction   . Bipolar disorder   . Hydrocele, unspecified   .  Intermittent claudication   . Lung nodule     Chest CT scan on 12/14/2008 showed a nodular opacity at the left lung base felt to most likely represent scarring.  Follow-up chest CT scan on 06/02/2010 showed parenchymal scarring in the left apex, left  lower lobe, and lingula; some of this scarring at the left base had a nodular appearance, unchanged. Chest 09/2012 - stable.  . 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  Thyroid scan 08/28/2009 was normal with no focal areas of abnormal increased or decreased activity seen; the uptake of I 131 sodium iodide at 24 hours was 5.7%.  TSH and free T4 normalized by 08/16/2009.  . Intestinal obstruction   . COPD (chronic obstructive pulmonary disease)   . Clostridium difficile colitis   . E coli bacteremia   . DVT (deep venous thrombosis)     He has hypercoagulability with multiple prior DVTs  . Pleural plaque     H/o asbestos exposure. Chest CT on 06/02/2010 showed stable extensive calcified pleural plaques involving the left hemithorax, consistent with asbestos related pleural disease.  . Weight loss     Normal colonoscopy by Dr. BrOlevia Perchesn 11/06/2010.  . Tachycardia-bradycardia syndrome     a. 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 b. St. Jude gen change 2010.  . Marland KitchenAF (paroxysmal atrial fibrillation)     Paroxysmal, failed medical therapy with amiodarone but has done very well with multaq;  d/c 2/2 to recurrent  AFib;  Tikosyn load c/b Hyperkalemia during admx 05/2013 => rate control strategy  . 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  . Noncompliance   . Atrial flutter   . Thrombocytopenia   . Hyperkalemia     type 4 RTA  . Pacemaker   . Shortness of breath     "all the time now" (07/02/2013)  . On home oxygen therapy     "2L prn" (07/02/2013)  . Pneumonia   . Cluster headaches     "~ twice/wk" (07/02/2013)    . Osteoarthritis   . Spondylosis   . Arthritis     "hips and legs" (07/02/2013)  . Chronic back pain     "all over my back" (07/02/2013)  . Anxiety   . CKD (chronic kidney disease)     Renal U/S 12/04/2009 showed no pathological findings. Labs 12/04/2009 include normal ESR, C3, C4; neg ANA; SPEP showed nonspecific increase in the alpha-2 region with no M-spike; UPEP showed no monoclonal free light chains; urine IFE showed polyclonal increase in feree Kappa and/or free Lambda light chains. Baseline Cr reported 1.7-2.5.    Past Surgical History  Procedure Laterality Date  . Hemiarthroplasty hip Left 09/09/2002    bipolar; Dr. [place] Dragon  . Pacemaker insertion  02/2003    Dr. Roe Rutherford, [place] pulse generator identity SR model [id number] serial [phone]; gen change by Greggory Brandy  . Total hip arthroplasty Right 03/08/2008     By Dr. Hiram Comber  . Tee without cardioversion N/A 05/27/2013    Procedure: TRANSESOPHAGEAL ECHOCARDIOGRAM (TEE);  Surgeon: Candee Furbish, MD;  [facility];  Service: Cardiovascular;  Laterality: N/A;  . Cardioversion N/A 05/27/2013    Procedure: CARDIOVERSION;  Surgeon: Candee Furbish, MD;  Location: [facility] ENDOSCOPY;  Service: Cardiovascular;  Laterality: N/A;  . Cardiac catheterization  2003    Nl cors, EF 35%  . Pacemaker placement  06/17/2008    Lead and generator change w/ St. Jude Medical Tendril ST model [id number] (serial  [id number]).       . Insert / replace / remove pacemaker    . Embolectomy Right 12/10/2013    Procedure: Right Femoral Embolectomy; Fasciotomy Right Lower Leg with Intraoperative arteriogram;  Surgeon: Rosetta Posner, MD;  Location: [place];  Service: Vascular;  Laterality: Right;  . Intraoperative arteriogram Right 12/10/2013    Procedure: INTRA OPERATIVE ARTERIOGRAM;  Surgeon: Rosetta Posner, MD;  Location: [place];  Service: Vascular;  Laterality: Right;  . Fasciotomy closure Right 12/15/2013    Procedure: LEG FASCIOTOMY CLOSURE;  Surgeon: Rosetta Posner, MD;  Location: [facility] OR;  Service: Vascular;  Laterality: Right;    Family History  Problem Relation Age of Onset  . Hypertension Mother   . Cancer Brother     Unsure of type.  . Asthma Mother   . Coronary artery disease Mother   . Colon cancer Neg Hx   . Lung cancer Neg Hx   . Prostate cancer Neg Hx     Social History:  reports that he quit smoking about 16 months ago. His smoking use included Cigarettes. He has a 81 pack-year smoking history. He has never used smokeless tobacco. He reports that he does not drink alcohol or use illicit drugs.  Allergies:  Allergies  Allergen Reactions  . Digoxin And Related Other (See Comments)    Dig toxicity 2009  .  Penicillins Rash    Medications: Medication Sig  albuterol (VENTOLIN HFA) 108 (90 BASE) MCG/ACT inhaler Inhale 2 puffs into the lungs every 6 (six) hours as needed for wheezing or shortness of breath.  betamethasone dipropionate (DIPROLENE) 0.05 % cream Apply to affected areas on back and arm once daily for 2 weeks.  Do not use on face or in skin folds.  buPROPion (WELLBUTRIN) 75 MG tablet Take 1 tablet (75 mg total) by mouth 2 (two) times daily.  diltiazem (CARDIZEM CD) 240 MG 24 hr capsule Take 1 capsule (240 mg total) by mouth 2 (two) times daily.  lactose free nutrition (BOOST PLUS) LIQD Take 237 mLs by mouth 3 (three) times daily with meals. Patient not taking: Reported on 08/10/2014  metoprolol (LOPRESSOR) 50 MG tablet Take 0.5 tablets (25 mg total) by mouth 2 (two) times daily.  omeprazole (PRILOSEC) 20 MG capsule Take 2 capsules (40 mg total) by mouth daily.  oxyCODONE-acetaminophen (PERCOCET) 10-325 MG per tablet Take 1 tablet by mouth every 4 (four) hours as needed for pain. Do not take more than 5 doses in a 24 hour period.  senna-docusate ([employer] SENNA PLUS) 8.6-50 MG per tablet Take 1 tablet by mouth daily as needed (for constipation).  sodium bicarbonate 650 MG tablet Take 2 tablets (1,300 mg total) by mouth 2 (two)  times daily.  tamsulosin (FLOMAX) 0.4 MG CAPS capsule Take 1 capsule (0.4 mg total) by mouth daily.  tiotropium (SPIRIVA) 18 MCG inhalation capsule Place 1 capsule (18 mcg total) into inhaler and inhale daily.  triamcinolone cream (KENALOG) 0.1 % Apply 1 application topically 2 (two) times daily. Use on affected areas on chest for 2 weeks.  warfarin (COUMADIN) 5 MG tablet TAKE AS DIRECTED BY THE ANTICOAGULATION CLINIC PROVIDER.     Results for orders placed or performed during the hospital encounter of 10/11/14 (from the past 48 hour(s))  Protime-INR     Status: None   Collection Time: 10/11/14 12:05 PM  Result Value Ref Range   Prothrombin Time 15.1 11.6 - 15.2 seconds   INR 1.18 0.00 - 1.49  APTT     Status: None   Collection Time: 10/11/14 12:05 PM  Result Value Ref Range   aPTT 28 24 - 37 seconds  CBC     Status: Abnormal   Collection Time: 10/11/14 12:05 PM  Result Value Ref Range   WBC 7.8 4.0 - 10.5 K/uL   RBC 4.80 4.22 - 5.81 MIL/uL   Hemoglobin 14.6 13.0 - 17.0 g/dL   HCT 43.6 39.0 - 52.0 %   MCV 90.8 78.0 - 100.0 fL   MCH 30.4 26.0 - 34.0 pg   MCHC 33.5 30.0 - 36.0 g/dL   RDW 14.7 11.5 - 15.5 %   Platelets 131 (L) 150 - 400 K/uL  Differential     Status: None   Collection Time: 10/11/14 12:05 PM  Result Value Ref Range   Neutrophils Relative % 70 43 - 77 %   Neutro Abs 5.4 1.7 - 7.7 K/uL   Lymphocytes Relative 17 12 - 46 %   Lymphs Abs 1.3 0.7 - 4.0 K/uL   Monocytes Relative 12 3 - 12 %   Monocytes Absolute 1.0 0.1 - 1.0 K/uL   Eosinophils Relative 1 0 - 5 %   Eosinophils Absolute 0.1 0.0 - 0.7 K/uL   Basophils Relative 0 0 - 1 %   Basophils Absolute 0.0 0.0 - 0.1 K/uL  Comprehensive metabolic panel     Status:  Abnormal   Collection Time: 10/11/14 12:05 PM  Result Value Ref Range   Sodium 143 135 - 145 mmol/L   Potassium 4.1 3.5 - 5.1 mmol/L   Chloride 109 101 - 111 mmol/L   CO2 24 22 - 32 mmol/L   Glucose, Bld 116 (H) 65 - 99 mg/dL   BUN 17 6 - 20 mg/dL     Creatinine, Ser 1.68 (H) 0.61 - 1.24 mg/dL   Calcium 9.3 8.9 - 10.3 mg/dL   Total Protein 7.2 6.5 - 8.1 g/dL   Albumin 3.7 3.5 - 5.0 g/dL   AST 72 (H) 15 - 41 U/L   ALT 15 (L) 17 - 63 U/L   Alkaline Phosphatase 76 38 - 126 U/L   Total Bilirubin 1.2 0.3 - 1.2 mg/dL   GFR calc non Af Amer 40 (L) >60 mL/min   GFR calc Af Amer 46 (L) >60 mL/min    Comment: (NOTE) The eGFR has been calculated using the CKD EPI equation. This calculation has not been validated in all clinical situations. eGFR's persistently <60 mL/min signify possible Chronic Kidney Disease.    Anion gap 10 5 - 15  Ethanol     Status: None   Collection Time: 10/11/14  1:05 PM  Result Value Ref Range   Alcohol, Ethyl (B) <5 <5 mg/dL    Comment:        LOWEST DETECTABLE LIMIT FOR SERUM ALCOHOL IS 11 mg/dL FOR MEDICAL PURPOSES ONLY   I-stat troponin, ED (not at [facility], [facility])     Status: Abnormal   Collection Time: 10/11/14  1:09 PM  Result Value Ref Range   Troponin i, poc 25.83 (HH) 0.00 - 0.08 ng/mL   Comment NOTIFIED PHYSICIAN    Comment 3            Comment: Due to the release kinetics of cTnI, a negative result within the first hours of the onset of symptoms does not rule out myocardial infarction with certainty. If myocardial infarction is still suspected, repeat the test at appropriate intervals.   I-Stat Chem 8, ED  (not at [facility], [facility])     Status: Abnormal   Collection Time: 10/11/14  1:10 PM  Result Value Ref Range   Sodium 143 135 - 145 mmol/L   Potassium 4.0 3.5 - 5.1 mmol/L   Chloride 107 101 - 111 mmol/L   BUN 20 6 - 20 mg/dL   Creatinine, Ser 1.60 (H) 0.61 - 1.24 mg/dL   Glucose, Bld 114 (H) 65 - 99 mg/dL   Calcium, Ion 1.15 1.13 - 1.30 mmol/L   TCO2 19 0 - 100 mmol/L   Hemoglobin 16.7 13.0 - 17.0 g/dL   HCT 49.0 39.0 - 52.0 %   *Note: Due to a large number of results and/or encounters for the requested time period, some results have not been displayed. A complete set of results can be found  in Results Review.    Ct Head Wo Contrast  10/11/2014   CLINICAL DATA:  Code stroke. RIGHT-sided facial droop with slurred speech.  EXAM: CT HEAD WITHOUT CONTRAST  TECHNIQUE: Contiguous axial images were obtained from the base of the skull through the vertex without intravenous contrast.  COMPARISON:  CT head 10/12/2012.  FINDINGS: No evidence for acute infarction, hemorrhage, mass lesion, hydrocephalus, or extra-axial fluid. Normal for age cerebral volume. No white matter disease. Calvarium intact. No sinus or mastoid fluid.  IMPRESSION: Negative exam.  No change from priors.  Critical Value/emergent results were  called by telephone at the time of interpretation on 10/11/2014 at 1:12 pm to Dr. Janann Colonel , who verbally acknowledged these results.   Electronically Signed   By: Rolla Flatten M.D.   On: 10/11/2014 13:14   Dg Chest Portable 1 View  10/11/2014   CLINICAL DATA:  Right facial droop  EXAM: PORTABLE CHEST - 1 VIEW  COMPARISON:  01/17/2014  FINDINGS: Cardiac shadow is again mildly enlarged. A pacing device is again seen. Chronic left pleural effusion and left basilar changes are noted although they have increased in the interval from the prior exam. The right lung remains clear. The visualized upper abdomen is unremarkable.  IMPRESSION: Increasing chronic changes in the left lung base when compare with the prior exam. Calcified pleural plaques are again noted.   Electronically Signed   By: Inez Catalina M.D.   On: 10/11/2014 14:06    Review of Systems  All other systems reviewed and are negative.   Review of Systems  Constitutional: Negative for fever and diaphoresis.  HENT: Positive for congestion.   Eyes: Positive for blurred vision.  Respiratory: Positive for cough and shortness of breath.   Cardiovascular: Negative for chest pain, orthopnea, leg swelling and PND.  Gastrointestinal: Negative for nausea, vomiting, abdominal pain, blood in stool and melena.  Musculoskeletal: Negative for  myalgias.  Neurological: Positive for dizziness, speech change and weakness.  All other systems reviewed and are negative.  Physical Exam  Nursing note and vitals reviewed.   Blood pressure 146/81, pulse 39, resp. rate 17, SpO2 95 %.  Nursing note and vitals reviewed. Constitutional: He is oriented to person, place, and time. He is thin, in  No distress.   Head: Normocephalic and atraumatic.  Mouth/Throat: No oropharyngeal exudate.  Eyes: EOM are normal. Pupils are equal, round, and reactive to light.  Neck: Normal range of motion. Neck supple. JVP is normal  No bruits   Cardiovascular: S1 normal and S2 normal.  An irregularly irregular rhythm present. Tachycardia present.   No murmur heard. Pulses:      Radial pulses are 2+ on the right side, and 2+ on the left side.       Dorsalis pedis pulses are 2+ on the right side, and 2+ on the left side.  No Carotid Bruits  Respiratory:  Respiratory: Effort normal. He has no wheezes. He has no rales.  Decrease BS on the left side. + rhonchi GI: Soft. Bowel sounds are normal. He exhibits no distension. There is no tenderness.  Musculoskeletal: He exhibits no edema.  Neurological: He is alert and oriented to person, place, and time. A cranial nerve deficit (Slight right facial droop) is present. Very minimall  LE 4/4 strength   Skin: Skin is warm and dry.  Psychiatric: He has a normal mood and affect.   Assessment/Plan Principal Problem:   STEMI (ST elevation myocardial infarction) Active Problems:   Atrial fibrillation with RVR   TIA (transient ischemic attack)   Essential hypertension   Chronic kidney disease   Chronic combined systolic and diastolic congestive heart failure   Weight loss    Atrial fibrillation, chronic    Noncompliance  STEMI and TIA in the setting of uncontrolled atrial fibrillation and noncompliance to anticoagulation. Troponin POC 25.83.  He is chest pain free and reports never having any.  INR is 1.18.  The  patient will be admitted to the CCU.  We have started IV heparin, cardizem and adding lopressor PO 25 BID for HR control IV  lopressor given in ER.  We will check an echocardiogram today.  Cycle troponin.      2.  TIA    Neurology is following pt.  3  S/p PPM  4  Pulm  Abnormal Xray  Patient with long tobacco history  Consider formal pulm eval    5.  Afib (CHADSVASc score = 4)  Rates not controlled  Add b blocker.  Heparin if [facility] with neurology    6  HTN  Follow on medical Rx     Tarri Fuller, [facility] 10/11/2014, 2:26 PM   Patient seen and examined  I agree wih findings as noted above by B Hager  I have amended to reflect my findings.  WIl continue to follow  Dorris Carnes

## 2014-10-12 ENCOUNTER — Encounter (HOSPITAL_COMMUNITY)
Admission: EM | Disposition: A | Discharge status: Home Health | Payer: Self-pay | Source: Home / Self Care | Attending: Internal Medicine

## 2014-10-12 ENCOUNTER — Inpatient Hospital Stay (HOSPITAL_COMMUNITY): Payer: Commercial Managed Care - HMO

## 2014-10-12 ENCOUNTER — Inpatient Hospital Stay (HOSPITAL_COMMUNITY): Payer: Commercial Managed Care - HMO | Admitting: Anesthesiology

## 2014-10-12 ENCOUNTER — Encounter (HOSPITAL_COMMUNITY): Payer: Self-pay | Admitting: Anesthesiology

## 2014-10-12 DIAGNOSIS — J449 Chronic obstructive pulmonary disease, unspecified: Secondary | ICD-10-CM

## 2014-10-12 DIAGNOSIS — I743 Embolism and thrombosis of arteries of the lower extremities: Secondary | ICD-10-CM

## 2014-10-12 DIAGNOSIS — G451 Carotid artery syndrome (hemispheric): Secondary | ICD-10-CM

## 2014-10-12 DIAGNOSIS — I482 Chronic atrial fibrillation: Secondary | ICD-10-CM

## 2014-10-12 DIAGNOSIS — N189 Chronic kidney disease, unspecified: Secondary | ICD-10-CM

## 2014-10-12 DIAGNOSIS — I129 Hypertensive chronic kidney disease with stage 1 through stage 4 chronic kidney disease, or unspecified chronic kidney disease: Secondary | ICD-10-CM

## 2014-10-12 DIAGNOSIS — M79605 Pain in left leg: Secondary | ICD-10-CM

## 2014-10-12 HISTORY — PX: EMBOLECTOMY: SHX44

## 2014-10-12 LAB — CBC
HEMATOCRIT: 40.1 % (ref 39.0–52.0)
HEMOGLOBIN: 13.4 g/dL (ref 13.0–17.0)
MCH: 30.5 pg (ref 26.0–34.0)
MCHC: 33.4 g/dL (ref 30.0–36.0)
MCV: 91.3 fL (ref 78.0–100.0)
PLATELETS: 121 10*3/uL — AB (ref 150–400)
RBC: 4.39 MIL/uL (ref 4.22–5.81)
RDW: 14.6 % (ref 11.5–15.5)
WBC: 5.9 10*3/uL (ref 4.0–10.5)

## 2014-10-12 LAB — LIPID PANEL
Cholesterol: 92 mg/dL (ref 0–200)
HDL: 36 mg/dL — ABNORMAL LOW (ref >40)
LDL Cholesterol: 50 mg/dL (ref 0–99)
Total CHOL/HDL Ratio: 2.6 RATIO
Triglycerides: 31 mg/dL (ref <150)
VLDL: 6 mg/dL (ref 0–40)

## 2014-10-12 LAB — BASIC METABOLIC PANEL
ANION GAP: 10 (ref 5–15)
BUN: 15 mg/dL (ref 6–20)
CHLORIDE: 107 mmol/L (ref 101–111)
CO2: 24 mmol/L (ref 22–32)
Calcium: 8.7 mg/dL — ABNORMAL LOW (ref 8.9–10.3)
Creatinine, Ser: 1.5 mg/dL — ABNORMAL HIGH (ref 0.61–1.24)
GFR calc Af Amer: 53 mL/min — ABNORMAL LOW (ref >60)
GFR calc non Af Amer: 45 mL/min — ABNORMAL LOW (ref >60)
Glucose, Bld: 85 mg/dL (ref 65–99)
Potassium: 4.5 mmol/L (ref 3.5–5.1)
Sodium: 141 mmol/L (ref 135–145)

## 2014-10-12 LAB — HEPARIN LEVEL (UNFRACTIONATED)
HEPARIN UNFRACTIONATED: 0.11 [IU]/mL — AB (ref 0.30–0.70)
Heparin Unfractionated: 1.42 IU/mL — ABNORMAL HIGH (ref 0.30–0.70)

## 2014-10-12 LAB — HEPATIC FUNCTION PANEL
ALBUMIN: 3 g/dL — AB (ref 3.5–5.0)
ALT: 17 U/L (ref 17–63)
AST: 57 U/L — ABNORMAL HIGH (ref 15–41)
Alkaline Phosphatase: 66 U/L (ref 38–126)
Bilirubin, Direct: 0.3 mg/dL (ref 0.1–0.5)
Indirect Bilirubin: 0.9 mg/dL (ref 0.3–0.9)
Total Bilirubin: 1.2 mg/dL (ref 0.3–1.2)
Total Protein: 5.8 g/dL — ABNORMAL LOW (ref 6.5–8.1)

## 2014-10-12 LAB — MAGNESIUM: Magnesium: 1.8 mg/dL (ref 1.7–2.4)

## 2014-10-12 LAB — GLUCOSE, CAPILLARY: Glucose-Capillary: 138 mg/dL — ABNORMAL HIGH (ref 65–99)

## 2014-10-12 LAB — HEMOGLOBIN A1C
HEMOGLOBIN A1C: 5.6 % (ref 4.8–5.6)
MEAN PLASMA GLUCOSE: 114 mg/dL

## 2014-10-12 LAB — SURGICAL PCR SCREEN
MRSA, PCR: NEGATIVE
STAPHYLOCOCCUS AUREUS: POSITIVE — AB

## 2014-10-12 LAB — TROPONIN I: Troponin I: 13.21 ng/mL (ref <0.031)

## 2014-10-12 SURGERY — EMBOLECTOMY
Anesthesia: General | Site: Groin | Laterality: Left

## 2014-10-12 MED ORDER — VANCOMYCIN HCL IN DEXTROSE 1-5 GM/200ML-% IV SOLN
1000.0000 mg | Freq: Once | INTRAVENOUS | Status: AC
  Administered 2014-10-12: 1000 mg via INTRAVENOUS
  Filled 2014-10-12: qty 200

## 2014-10-12 MED ORDER — PROPOFOL 10 MG/ML IV BOLUS
INTRAVENOUS | Status: AC
  Filled 2014-10-12: qty 20

## 2014-10-12 MED ORDER — ONDANSETRON HCL 4 MG/2ML IJ SOLN
INTRAMUSCULAR | Status: AC
Start: 2014-10-12 — End: 2014-10-12
  Filled 2014-10-12: qty 2

## 2014-10-12 MED ORDER — LIDOCAINE HCL (CARDIAC) 20 MG/ML IV SOLN
INTRAVENOUS | Status: DC | PRN
  Administered 2014-10-12: 70 mg via INTRAVENOUS

## 2014-10-12 MED ORDER — LACTATED RINGERS IV SOLN
INTRAVENOUS | Status: DC | PRN
  Administered 2014-10-12: 11:00:00 via INTRAVENOUS

## 2014-10-12 MED ORDER — LACTATED RINGERS IV SOLN
INTRAVENOUS | Status: DC
  Administered 2014-10-12: 10:00:00 via INTRAVENOUS

## 2014-10-12 MED ORDER — HEPARIN (PORCINE) IN NACL 100-0.45 UNIT/ML-% IJ SOLN
1300.0000 [IU]/h | INTRAMUSCULAR | Status: DC
  Administered 2014-10-12: 950 [IU]/h via INTRAVENOUS
  Filled 2014-10-12 (×2): qty 250

## 2014-10-12 MED ORDER — HYDROMORPHONE HCL 1 MG/ML IJ SOLN: 0.2500 mg | INTRAMUSCULAR | Status: DC | PRN

## 2014-10-12 MED ORDER — HEPARIN SODIUM (PORCINE) 5000 UNIT/ML IJ SOLN
INTRAMUSCULAR | Status: DC | PRN
  Administered 2014-10-12: 500 mL

## 2014-10-12 MED ORDER — HEPARIN BOLUS VIA INFUSION
2000.0000 [IU] | Freq: Once | INTRAVENOUS | Status: AC
  Administered 2014-10-12: 2000 [IU] via INTRAVENOUS
  Filled 2014-10-12: qty 2000

## 2014-10-12 MED ORDER — LIDOCAINE HCL (CARDIAC) 20 MG/ML IV SOLN
INTRAVENOUS | Status: AC
  Filled 2014-10-12: qty 5

## 2014-10-12 MED ORDER — IPRATROPIUM-ALBUTEROL 0.5-2.5 (3) MG/3ML IN SOLN
3.0000 mL | RESPIRATORY_TRACT | Status: DC | PRN
  Filled 2014-10-12: qty 3

## 2014-10-12 MED ORDER — IOHEXOL 300 MG/ML  SOLN
INTRAMUSCULAR | Status: DC | PRN
  Administered 2014-10-12: 20 mL via INTRAVENOUS

## 2014-10-12 MED ORDER — PHENYLEPHRINE HCL 10 MG/ML IJ SOLN
10.0000 mg | INTRAVENOUS | Status: DC | PRN
  Administered 2014-10-12: 25 ug/min via INTRAVENOUS

## 2014-10-12 MED ORDER — 0.9 % SODIUM CHLORIDE (POUR BTL) OPTIME
TOPICAL | Status: DC | PRN
  Administered 2014-10-12: 1000 mL

## 2014-10-12 MED ORDER — MIDAZOLAM HCL 2 MG/2ML IJ SOLN
INTRAMUSCULAR | Status: AC
  Filled 2014-10-12: qty 2

## 2014-10-12 MED ORDER — OXYCODONE-ACETAMINOPHEN 5-325 MG PO TABS
1.0000 | ORAL_TABLET | Freq: Once | ORAL | Status: AC
  Administered 2014-10-12: 1 via ORAL
  Filled 2014-10-12: qty 1

## 2014-10-12 MED ORDER — PROMETHAZINE HCL 25 MG/ML IJ SOLN
6.2500 mg | INTRAMUSCULAR | Status: DC | PRN
Start: 2014-10-12 — End: 2014-10-12

## 2014-10-12 MED ORDER — HEPARIN SODIUM (PORCINE) 1000 UNIT/ML IJ SOLN
INTRAMUSCULAR | Status: AC
  Filled 2014-10-12: qty 1

## 2014-10-12 MED ORDER — SUCCINYLCHOLINE CHLORIDE 20 MG/ML IJ SOLN
INTRAMUSCULAR | Status: DC | PRN
  Administered 2014-10-12: 100 mg via INTRAVENOUS

## 2014-10-12 MED ORDER — METRONIDAZOLE 500 MG PO TABS: 2000.0000 mg | ORAL_TABLET | Freq: Once | ORAL | Status: DC

## 2014-10-12 MED ORDER — PROPRANOLOL HCL 1 MG/ML IV SOLN
INTRAVENOUS | Status: DC | PRN
  Administered 2014-10-12 (×4): .25 mg via INTRAVENOUS

## 2014-10-12 MED ORDER — MIDAZOLAM HCL 5 MG/5ML IJ SOLN
INTRAMUSCULAR | Status: DC | PRN
  Administered 2014-10-12: 1 mg via INTRAVENOUS

## 2014-10-12 MED ORDER — SUCCINYLCHOLINE CHLORIDE 20 MG/ML IJ SOLN
INTRAMUSCULAR | Status: AC
  Filled 2014-10-12: qty 1

## 2014-10-12 MED ORDER — SODIUM CHLORIDE 0.9 % IV SOLN: INTRAVENOUS | Status: AC

## 2014-10-12 MED ORDER — FENTANYL CITRATE (PF) 250 MCG/5ML IJ SOLN
INTRAMUSCULAR | Status: AC
  Filled 2014-10-12: qty 5

## 2014-10-12 MED ORDER — MAGNESIUM SULFATE IN D5W 10-5 MG/ML-% IV SOLN
1.0000 g | Freq: Once | INTRAVENOUS | Status: AC
  Administered 2014-10-12: 1 g via INTRAVENOUS
  Filled 2014-10-12: qty 100

## 2014-10-12 MED ORDER — HEPARIN SODIUM (PORCINE) 1000 UNIT/ML IJ SOLN
INTRAMUSCULAR | Status: DC | PRN
  Administered 2014-10-12: 4000 [IU] via INTRAVENOUS

## 2014-10-12 MED ORDER — ONDANSETRON HCL 4 MG/2ML IJ SOLN
INTRAMUSCULAR | Status: DC | PRN
  Administered 2014-10-12: 4 mg via INTRAVENOUS

## 2014-10-12 MED ORDER — METRONIDAZOLE 500 MG PO TABS
2000.0000 mg | ORAL_TABLET | Freq: Once | ORAL | Status: AC
  Administered 2014-10-12: 2000 mg via ORAL
  Filled 2014-10-12: qty 4

## 2014-10-12 MED ORDER — FENTANYL CITRATE (PF) 100 MCG/2ML IJ SOLN
INTRAMUSCULAR | Status: DC | PRN
  Administered 2014-10-12: 100 ug via INTRAVENOUS

## 2014-10-12 MED ORDER — PROPOFOL 10 MG/ML IV BOLUS
INTRAVENOUS | Status: DC | PRN
  Administered 2014-10-12: 100 mg via INTRAVENOUS

## 2014-10-12 SURGICAL SUPPLY — 52 items
BANDAGE ESMARK 6X9 LF (GAUZE/BANDAGES/DRESSINGS) IMPLANT
BENZOIN TINCTURE PRP APPL 2/3 (GAUZE/BANDAGES/DRESSINGS) ×3 IMPLANT
BNDG ESMARK 6X9 LF (GAUZE/BANDAGES/DRESSINGS)
CANISTER SUCTION 2500CC (MISCELLANEOUS) ×3 IMPLANT
CANNULA VESSEL 3MM 2 BLNT TIP (CANNULA) ×3 IMPLANT
CATH EMB 3FR 80CM (CATHETERS) IMPLANT
CATH EMB 4FR 80CM (CATHETERS) ×3 IMPLANT
CATH EMB 5FR 80CM (CATHETERS) IMPLANT
CLIP LIGATING EXTRA MED SLVR (CLIP) ×3 IMPLANT
CLIP LIGATING EXTRA SM BLUE (MISCELLANEOUS) ×3 IMPLANT
CLOSURE WOUND 1/2 X4 (GAUZE/BANDAGES/DRESSINGS) ×1
CUFF TOURNIQUET SINGLE 34IN LL (TOURNIQUET CUFF) IMPLANT
CUFF TOURNIQUET SINGLE 44IN (TOURNIQUET CUFF) IMPLANT
DRAIN SNY 10X20 3/4 PERF (WOUND CARE) IMPLANT
DRAPE X-RAY CASS 24X20 (DRAPES) ×3 IMPLANT
DRSG COVADERM 4X8 (GAUZE/BANDAGES/DRESSINGS) IMPLANT
ELECT REM PT RETURN 9FT ADLT (ELECTROSURGICAL) ×3
ELECTRODE REM PT RTRN 9FT ADLT (ELECTROSURGICAL) ×1 IMPLANT
EVACUATOR SILICONE 100CC (DRAIN) IMPLANT
GLOVE BIO SURGEON STRL SZ 6.5 (GLOVE) ×4 IMPLANT
GLOVE BIO SURGEONS STRL SZ 6.5 (GLOVE) ×2
GLOVE BIOGEL PI IND STRL 6.5 (GLOVE) ×4 IMPLANT
GLOVE BIOGEL PI INDICATOR 6.5 (GLOVE) ×8
GLOVE SS BIOGEL STRL SZ 7.5 (GLOVE) ×1 IMPLANT
GLOVE SUPERSENSE BIOGEL SZ 7.5 (GLOVE) ×2
GLOVE SURG SS PI 7.0 STRL IVOR (GLOVE) ×3 IMPLANT
GOWN STRL REUS W/ TWL LRG LVL3 (GOWN DISPOSABLE) ×5 IMPLANT
GOWN STRL REUS W/TWL LRG LVL3 (GOWN DISPOSABLE) ×10
KIT BASIN OR (CUSTOM PROCEDURE TRAY) ×3 IMPLANT
KIT ROOM TURNOVER OR (KITS) ×3 IMPLANT
NS IRRIG 1000ML POUR BTL (IV SOLUTION) ×6 IMPLANT
PACK PERIPHERAL VASCULAR (CUSTOM PROCEDURE TRAY) ×3 IMPLANT
PAD ARMBOARD 7.5X6 YLW CONV (MISCELLANEOUS) ×6 IMPLANT
PADDING CAST COTTON 6X4 STRL (CAST SUPPLIES) IMPLANT
SET COLLECT BLD 21X3/4 12 (NEEDLE) IMPLANT
SET COLLECT BLD 21X3/4 12 PB (MISCELLANEOUS) ×3 IMPLANT
SPONGE GAUZE 4X4 12PLY STER LF (GAUZE/BANDAGES/DRESSINGS) ×3 IMPLANT
STAPLER VISISTAT 35W (STAPLE) IMPLANT
STOPCOCK 4 WAY LG BORE MALE ST (IV SETS) ×3 IMPLANT
STRIP CLOSURE SKIN 1/2X4 (GAUZE/BANDAGES/DRESSINGS) ×2 IMPLANT
SUT ETHILON 3 0 PS 1 (SUTURE) IMPLANT
SUT PROLENE 5 0 C 1 24 (SUTURE) ×3 IMPLANT
SUT PROLENE 6 0 CC (SUTURE) ×6 IMPLANT
SUT VIC AB 2-0 CTX 36 (SUTURE) ×3 IMPLANT
SUT VIC AB 3-0 SH 27 (SUTURE) ×2
SUT VIC AB 3-0 SH 27X BRD (SUTURE) ×1 IMPLANT
SYR 3ML LL SCALE MARK (SYRINGE) ×3 IMPLANT
TAPE CLOTH SURG 4X10 WHT LF (GAUZE/BANDAGES/DRESSINGS) ×3 IMPLANT
TRAY FOLEY CATH 16FRSI W/METER (SET/KITS/TRAYS/PACK) ×3 IMPLANT
TUBING EXTENTION W/L.L. (IV SETS) ×3 IMPLANT
UNDERPAD 30X30 INCONTINENT (UNDERPADS AND DIAPERS) ×3 IMPLANT
WATER STERILE IRR 1000ML POUR (IV SOLUTION) ×3 IMPLANT

## 2014-10-12 NOTE — Progress Notes (Signed)
alind d/c per dr massage order pressure applied x 5 min and pressure bandage applied

## 2014-10-12 NOTE — Progress Notes (Signed)
STROKE TEAM PROGRESS NOTE   HISTORY [name] is an 69 y.o. male male with multiple medical problems including PAF, DVT on chronic coumadin, HTN, and CKD. Today he was driving to his friends house to get his car fixed. At 11:15 he noted he could not get his words out correctly and he had a right facial droop (LKW 10/11/2014 at 1115). EMS was called and he was brought to [facility] as code stroke. On arrival he had a right facial droop and had sever dysarthria. HE had a HR 190 in Afib on arrival. Patient was placed supine and HR slowed to 150 while in CT. 10 minutes later his symptoms fully resolved. He admits to being poorly compliant with his coumadin. While in ED he was noted to have a EKG consistent with MI and Troponin 25. Modified Rankin: Rankin Score=0 Patient was not administered TPA secondary to symptoms resolved. He was admitted for further evaluation and treatment.   SUBJECTIVE (INTERVAL HISTORY) His daughter is at the bedside.  Overall he feels his condition is stable. He had STEMI and on heparin drip. He also had TIA episode now resolved but not able have MRI due to pacer. He had left leg embolus for which he had left femoral embolectomy done this afternoon. Currently he is postop in doing fine. Continue on heparin IV now.   OBJECTIVE Temp:  [97.2 F (36.2 C)-98.6 F (37 C)] 97.2 F (36.2 C) (05/25 1400) Pulse Rate:  [50-137] 68 (05/25 1715) Cardiac Rhythm:  [-] Normal sinus rhythm (05/25 1424) Resp:  [13-26] 19 (05/25 1715) BP: (96-133)/(65-101) 121/81 mmHg (05/25 1715) SpO2:  [93 %-100 %] 96 % (05/25 1715) Weight:  [152 lb 5.4 oz (69.1 kg)] 152 lb 5.4 oz (69.1 kg) (05/25 0400)   Recent Labs Lab 10/12/14 1314  GLUCAP 138*    Recent Labs Lab 10/11/14 1205 10/11/14 1310 10/11/14 1903 10/11/14 2136 10/12/14 0426  NA 143 143  --   --  141  K 4.1 4.0  --   --  4.5  CL 109 107  --   --  107  CO2 24  --   --   --  24  GLUCOSE 116* 114*  --   --  85  BUN 17 20  --    --  15  CREATININE 1.68* 1.60*  --   --  1.50*  CALCIUM 9.3  --   --   --  8.7*  MG  --   --  2.0 1.8 1.8    Recent Labs Lab 10/11/14 1205 10/12/14 0426  AST 72* 57*  ALT 15* 17  ALKPHOS 76 66  BILITOT 1.2 1.2  PROT 7.2 5.8*  ALBUMIN 3.7 3.0*    Recent Labs Lab 10/11/14 1205 10/11/14 1310 10/12/14 0426  WBC 7.8  --  5.9  NEUTROABS 5.4  --   --   HGB 14.6 16.7 13.4  HCT 43.6 49.0 40.1  MCV 90.8  --  91.3  PLT 131*  --  121*    Recent Labs Lab 10/11/14 1526 10/11/14 1903 10/11/14 2136 10/12/14 0426  TROPONINI 20.70* 17.01* 16.22* 13.21*    Recent Labs  10/11/14 1205  LABPROT 15.1  INR 1.18    Recent Labs  10/11/14 1724  COLORURINE YELLOW  LABSPEC 1.010  PHURINE 7.0  GLUCOSEU NEGATIVE  HGBUR MODERATE*  BILIRUBINUR NEGATIVE  KETONESUR NEGATIVE  PROTEINUR NEGATIVE  UROBILINOGEN 1.0  NITRITE NEGATIVE  LEUKOCYTESUR MODERATE*       Component  Value Date/Time   CHOL 92 10/12/2014 0426   TRIG 31 10/12/2014 0426   TRIG 49 02/06/2010   HDL 36* 10/12/2014 0426   CHOLHDL 2.6 10/12/2014 0426   VLDL 6 10/12/2014 0426   LDLCALC 50 10/12/2014 0426   Lab Results  Component Value Date   HGBA1C 5.6 10/11/2014      Component Value Date/Time   LABOPIA NONE DETECTED 10/11/2014 1724   LABOPIA NEGATIVE 12/27/2008 1943   COCAINSCRNUR NONE DETECTED 10/11/2014 1724   COCAINSCRNUR NEG 07/06/2009 2225   LABBENZ NONE DETECTED 10/11/2014 1724   LABBENZ NEG 07/06/2009 2225   AMPHETMU NONE DETECTED 10/11/2014 1724   AMPHETMU NEG 07/06/2009 2225   THCU POSITIVE* 10/11/2014 1724   LABBARB NONE DETECTED 10/11/2014 1724     Recent Labs Lab 10/11/14 1305  ETH <5    Ct Head Wo Contrast  10/11/2014   IMPRESSION: Negative exam.  No change from priors.    2D echo - - Left ventricle: The cavity size was normal. Systolic function was moderately to severely reduced. The estimated ejection fraction was in the range of 30% to 35%. Diffuse hypokinesis. - Aortic  valve: There was mild regurgitation. - Right ventricle: The cavity size was mildly dilated. Wall thickness was normal. Systolic function was moderately reduced. - Right atrium: The atrium was mildly dilated. Impressions: - No cardiac source of emboli was indentified.  CUS - pending  PHYSICAL EXAM  Temp:  [97.2 F (36.2 C)-98.6 F (37 C)] 97.2 F (36.2 C) (05/25 1400) Pulse Rate:  [50-137] 68 (05/25 1715) Resp:  [13-26] 19 (05/25 1715) BP: (96-133)/(65-101) 121/81 mmHg (05/25 1715) SpO2:  [93 %-100 %] 96 % (05/25 1715) Weight:  [152 lb 5.4 oz (69.1 kg)] 152 lb 5.4 oz (69.1 kg) (05/25 0400)  General - Well nourished, well developed, in no apparent distress.  Ophthalmologic - Fundi not visualized due to noncooperation.  Cardiovascular - irregularly irregular heart rate and rhythm.  Mental Status -  Level of arousal and orientation to time, place, and person were intact. Language including expression, naming, repetition, comprehension was assessed and found intact. Fund of Knowledge was assessed and was intact.  Cranial Nerves II - XII - II - Visual field intact OU. III, IV, VI - Extraocular movements intact. V - Facial sensation intact bilaterally. VII - Facial movement intact bilaterally. VIII - Hearing & vestibular intact bilaterally. X - Palate elevates symmetrically. XI - Chin turning & shoulder shrug intact bilaterally. XII - Tongue protrusion intact.  Motor Strength - The patient's strength was normal in all extremities and pronator drift was absent.  Bulk was normal and fasciculations were absent.   Motor Tone - Muscle tone was assessed at the neck and appendages and was normal.  Reflexes - The patient's reflexes were 1+ in all extremities and he had no pathological reflexes.  Sensory - Light touch, temperature/pinprick, vibration and proprioception, and Romberg testing were assessed and were symmetrical.    Coordination - The patient had normal movements in the  hands and feet with no ataxia or dysmetria.  Tremor was absent.  Gait and Station - deferred due to safety concerns.   ASSESSMENT/PLAN [name] is a 69 y.o. 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 He did not receive IV t-PA due to resolved symptoms.   Stroke vs. TIA:  Dominant left hemisphere, embolic secondary  to atrial fibrillation noncompliant with Coumadin  Resultant  symptoms resolved  MRI  pacer  MRA  pacer  Carotid Doppler  pending  2D Echo  EF 30-35%  TEE done 05/2013 EF 45-50%, smoke LA, no PFO, no shunt  LDL 50  HgbA1c 5.6  IV heparin for VTE prophylaxis Diet Heart Room service appropriate?: Yes; Fluid consistency:: Thin Diet NPO time specified Except for: Sips with Meds  aspirin 81 mg orally every day and warfarin prior to admission, now on aspirin 81 mg orally every day and heparin IV  Patient counseled to be compliant with his antithrombotic medications  Ongoing aggressive stroke risk factor management  Atrial Fibrillation with RVR  Home Anticoagulation meds:  WARFARIN, non compliant, INR 1.18 on admission  cardizem prior to admission, now on Cardizem drip  Rate controlled  Cardiology on board  Currently on heparin IV  Patient continued to need long-term anticoagulation with Coumadin as outpatient  STEMI  Elevated troponins  New t-wave inversion, ST elevation  Full dose IV heparin  Cardiology on board  Troponin trending down  Left leg embolus  Status post embolectomy  Vascular surgery on board  Heparin IV   Hypertension  Home meds:   LOPRESSOR  Stable  Patient counseled to be compliant with his blood pressure medications  Ischemic cardiomyopathy  EF 30-35%  Heparin IV  Other Stroke Risk Factors  Advanced age  Former Cigarette smoker, quit smoking 18 months  ago  Hx ETOH use, stopped 2012  THC positive this admission  Family hx stroke (mother)  Other Active Problems  Chronic systolic HF  Pacemaker  COPD. Home O2  Thrombosis - History of arterial and venous thrombosis including portal vein thrombosis, deep vein thrombosis, and superior mesenteric artery thrombosis.hypercoagulable with mult DVTs  AKI on CKD, Cr 1.7  Other Pertinent History  Depression  Bipolar  Lung nodule, stable  Hospital day # 1  Rosalin Hawking, MD PhD Stroke Neurology 10/12/2014 6:34 PM    To contact Stroke Continuity provider, please refer to [email or web address] After hours, contact General Neurology

## 2014-10-12 NOTE — Progress Notes (Signed)
OT Cancellation Note  Patient Details Name: [name] MRN: [id number] DOB: Aug 10, 1945   Cancelled Treatment:    Reason Eval/Treat Not Completed: Patient at procedure or test/ unavailable. Pt in OR for emergency surgery per RN note. Acute OT will follow up as available to complete evaluation as appropriate.   Villa Herb M 10/12/2014, 10:31 AM   Cyndie Chime, OTR/L Occupational Therapist [phone] (pager)

## 2014-10-12 NOTE — Progress Notes (Signed)
SLP Cancellation Note  Patient Details Name: [name] MRN: [id number] DOB: 03/09/1946   Cancelled treatment:       Reason Eval/Treat Not Completed: Patient at procedure or test/unavailable   Kosta Schnitzler, Katherene Ponto 10/12/2014, 9:35 AM

## 2014-10-12 NOTE — Progress Notes (Signed)
ANTICOAGULATION CONSULT NOTE - Initial Consult  Pharmacy Consult for Heparin Indication: chest pain/ACS   Allergies  Allergen Reactions  . Digoxin And Related Other (See Comments)    Dig toxicity 2009  . Penicillins Rash    Patient Measurements: Weight: 152 lb 5.4 oz (69.1 kg)  Ht  75 in    Wt: 69.9 kg IBW: 85kg Heparin Dosing Weight: 70 kg  Vital Signs: Temp: 98.6 F (37 C) (05/25 0400) Temp Source: Oral (05/25 0400) BP: 116/79 mmHg (05/25 0400) Pulse Rate: 78 (05/25 0400)  Labs:  Recent Labs  10/11/14 1205 10/11/14 1310 10/11/14 1526 10/11/14 1903 10/11/14 2136 10/12/14 0426  HGB 14.6 16.7  --   --   --  13.4  HCT 43.6 49.0  --   --   --  40.1  PLT 131*  --   --   --   --  121*  APTT 28  --   --   --   --   --   LABPROT 15.1  --   --   --   --   --   INR 1.18  --   --   --   --   --   HEPARINUNFRC  --   --   --   --  0.40 0.11*  CREATININE 1.68* 1.60*  --   --   --   --   TROPONINI  --   --  20.70* 17.01* 16.22*  --     Estimated Creatinine Clearance: 42 mL/min (by C-G formula based on Cr of 1.6).   Medical History: Past Medical History  Diagnosis Date  . Hypertension   . Chronic systolic heart failure     a. NICM - EF 35% with normal cors 2003. b. Last echo 11/2012 - EF 25-30%.  . Depression   . GERD (gastroesophageal reflux disease)   . Portal vein thrombosis   . Avascular necrosis of bones of both hips     Status post bilateral hip replacements  . Gastritis   . Alcohol abuse   . Erectile dysfunction   . Bipolar disorder   . Hydrocele, unspecified   . Intermittent claudication   . Lung nodule     Chest CT scan on 12/14/2008 showed a nodular opacity at the left lung base felt to most likely represent scarring.  Follow-up chest CT scan on 06/02/2010 showed parenchymal scarring in the left apex, left  lower lobe, and lingula; some of this scarring at the left base had a nodular appearance, unchanged. Chest 09/2012 - stable.  . 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  Thyroid scan 08/28/2009 was normal with no focal areas of abnormal increased or decreased activity seen; the uptake of I 131 sodium iodide at 24 hours was 5.7%.  TSH and free T4 normalized by 08/16/2009.  . Intestinal obstruction   . COPD (chronic obstructive pulmonary disease)   . Clostridium difficile colitis   . E coli bacteremia   . DVT (deep venous thrombosis)     He has hypercoagulability with multiple prior DVTs  . Pleural plaque     H/o asbestos exposure. Chest CT on 06/02/2010 showed stable extensive calcified pleural plaques involving the left hemithorax, consistent with asbestos related pleural disease.  . Weight loss     Normal colonoscopy by Dr. Olevia Perches on 11/06/2010.  . Tachycardia-bradycardia syndrome     a. 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 b. St. Jude gen change 2010.  [name]  PAF (paroxysmal atrial fibrillation)     Paroxysmal, failed medical therapy with amiodarone but has done very well with multaq;  d/c 2/2 to recurrent AFib;  Tikosyn load c/b Hyperkalemia during admx 05/2013 => rate control strategy  . 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  . Noncompliance   . Atrial flutter   . Thrombocytopenia   . Hyperkalemia     type 4 RTA  . Pacemaker   . Shortness of breath     "all the time now" (07/02/2013)  . On home oxygen therapy     "3L prn; need it maybe 3-4 times/wk" (10/11/2014)  . Pneumonia   . Cluster headaches     "~ twice/wk" (07/02/2013)  . Osteoarthritis   . Spondylosis   . Arthritis     "hips and legs" (07/02/2013)  . Chronic back pain     "all over my back" (07/02/2013)  . Anxiety   . CKD (chronic kidney disease)     Renal U/S 12/04/2009 showed no pathological findings. Labs 12/04/2009 include normal ESR, C3, C4; neg ANA; SPEP showed nonspecific increase in the alpha-2 region with no M-spike; UPEP  showed no monoclonal free light chains; urine IFE showed polyclonal increase in feree Kappa and/or free Lambda light chains. Baseline Cr reported 1.7-2.5.  . STEMI (ST elevation myocardial infarction) 10/11/2014    Medications:  See electronic med rec  Assessment: 69 y.o. male admitted as possible code stroke - symptoms resolved w/in 10 min so cancelled. Spoke with Neuro - considering this TIA. Head CT neg. Not able to do MRI due to pacer. Pt on coumadin PTA for PAF, h/o DVT. Admit INR 1.18. Pt admits noncompliance. Found to have MI with troponin 25.8. To begin heparin for ACS. Neuro ok with doing full-dose heparin with bolus for MI. Pt with CKD (est CrCl 30 ml/min). H/H ok at baseline, Plt slightly low 131 (160s ~30mo ago).  F/u HL is SUBtherapeutic at 0.11 on heparin 850 units/hr. Nurse report no issues with infusion or bleeding.  Goal of Therapy:  Heparin level 0.3-0.7 units/ml  Monitor platelets by anticoagulation protocol: Yes   Plan:  Heparin IV bolus 2000 units Heparin IV gtt at 1050 units/hr F/u 8 hr heparin level Daily heparin level and CBC  CAndrey Cota BDiona Foley PharmD Clinical Pharmacist Pager [phone] 10/12/2014,5:29 AM

## 2014-10-12 NOTE — Progress Notes (Signed)
Subjective: Left leg pain  Objective: Vital signs in last 24 hours: Temp:  [97.8 F (36.6 C)-98.6 F (37 C)] 98.6 F (37 C) (05/25 0400) Pulse Rate:  [39-141] 78 (05/25 0400) Resp:  [12-28] 21 (05/25 0400) BP: (101-146)/(66-105) 116/79 mmHg (05/25 0400) SpO2:  [93 %-100 %] 100 % (05/25 0400) Weight:  [152 lb 5.4 oz (69.1 kg)] 152 lb 5.4 oz (69.1 kg) (05/25 0400) Last BM Date: 10/09/14  Intake/Output from previous day: 05/24 0701 - 05/25 0700 In: 480 [P.O.:480] Out: 375 [Urine:375] Intake/Output this shift:    Medications Scheduled Meds: . aspirin EC  81 mg Oral Daily  . buPROPion  75 mg Oral BID  . magnesium sulfate 1 - 4 g bolus IVPB  1 g Intravenous Once  . metoprolol  25 mg Oral BID  . oxyCODONE-acetaminophen  1 tablet Oral Once  . pantoprazole  40 mg Oral Daily  . tamsulosin  0.4 mg Oral Daily  . tiotropium  18 mcg Inhalation Daily   Continuous Infusions: . diltiazem (CARDIZEM) infusion 5 mg/hr (10/12/14 8882)  . heparin 1,050 Units/hr (10/12/14 0606)   PRN Meds:.albuterol, metoprolol, oxyCODONE-acetaminophen, senna-docusate  PE: General appearance: alert, cooperative and mild distress Lungs: Diffuse coarse crackles and rhonchi Heart: irregularly irregular rhythm Abdomen: +BS, nontender Extremities: No LEE Pulses: 2+ radials and right DP.  0 left pop, DP, PT.  2+ left femoral Skin: Warm and dry Neurologic: Grossly normal  Lab Results:   Recent Labs  10/11/14 1205 10/11/14 1310 10/12/14 0426  WBC 7.8  --  5.9  HGB 14.6 16.7 13.4  HCT 43.6 49.0 40.1  PLT 131*  --  121*   BMET  Recent Labs  10/11/14 1205 10/11/14 1310  NA 143 143  K 4.1 4.0  CL 109 107  CO2 24  --   GLUCOSE 116* 114*  BUN 17 20  CREATININE 1.68* 1.60*  CALCIUM 9.3  --    PT/INR  Recent Labs  10/11/14 1205  LABPROT 15.1  INR 1.18   Cholesterol  Recent Labs  10/12/14 0426  CHOL 92   Lipid Panel     Component Value Date/Time   CHOL 92 10/12/2014 0426    TRIG 31 10/12/2014 0426   TRIG 49 02/06/2010   HDL 36* 10/12/2014 0426   CHOLHDL 2.6 10/12/2014 0426   VLDL 6 10/12/2014 0426   LDLCALC 50 10/12/2014 0426   Cardiac Panel (last 3 results)  Recent Labs  10/11/14 1903 10/11/14 2136 10/12/14 0426  TROPONINI 17.01* 16.22* 13.21*     Assessment/Plan  Principal Problem:   STEMI (ST elevation myocardial infarction) Likely secondary to emboli from not taking coumadin.  Which would explain the TIA and now an ischemic left leg.  He never had any chest pain.  Troponin trending down from a peak POC of 25.83(see above).  He is on IV heparin.  We did not take him to the cath lab.   EF 30-35% with diffuse hypokinesis.  Previous EF 45-50% last July.       Atrial fibrillation with RVR Rate better on Cardizem 5mg /HR.  Now with his EF of 30% we should consider transition to just a BB.  Of course, he has COPD as well.  I would leave on IV cardizem until his leg is sorted out.   Consider NOAC as he may be more compliant than coumadin.  CR > 30.    Left leg pain  He has a good left femoral pulse but no palpable  popliteal, DP or PT.  No DP or PT with doppler.  Dr. Donnetta Hutching notified by primary team.  He is taking him to the OR.      TIA (transient ischemic attack)   Essential hypertension  Controlled.   Chronic kidney disease, 3  Stable.  Baseline ~1.6   Chronic combined systolic and diastolic congestive heart failure  Appears euvolemic but watch volume status with low EF   Weight loss   Atrial fibrillation, chronic    LOS: 1 day    HAGER, BRYAN PA-C 10/12/2014 8:06 AM  I have seen and examined the patient along with HAGER, BRYAN PA-C.  I have reviewed the chart, notes and new data.  I agree with PA/NP's note.  Key new complaints: now s/p left femoral embolectomy, still sleepy, but speaking clearly and coherently Key examination changes: no JVD, rales or edema, irregular rhythm, warm left foot  Key new findings / data: troponin declining  since admission (peak 25)  PLAN: Maintain uninterrupted anticoagulation. TEE is not indicated. Whether this was embolism from a left atrial thrombus (more likely) or left ventricular thrombus, I would still recommend lifelong anticoagulation. With poor compliance, NOACs have no advantage over warfarin and may actually be less effective due to shorter duration of action. Watch carefully for development of CHF with lower LVEF - avoid excessive IV fluids.  Sanda Klein, MD, [place] [phone] 10/12/2014, 3:48 PM

## 2014-10-12 NOTE — Progress Notes (Signed)
ANTICOAGULATION CONSULT NOTE - Initial Consult  Pharmacy Consult for Heparin Indication: chest pain/ACS   Allergies  Allergen Reactions  . Digoxin And Related Other (See Comments)    Dig toxicity 2009  . Penicillins Rash    Patient Measurements: Weight: 152 lb 5.4 oz (69.1 kg)  Ht  75 in    Wt: 69.9 kg IBW: 85kg Heparin Dosing Weight: 70 kg  Vital Signs: Temp: 97.2 F (36.2 C) (05/25 1400) Temp Source: Oral (05/25 0800) BP: 121/81 mmHg (05/25 1715) Pulse Rate: 68 (05/25 1715)  Labs:  Recent Labs  10/11/14 1205 10/11/14 1310  10/11/14 1903 10/11/14 2136 10/12/14 0426 10/12/14 1720  HGB 14.6 16.7  --   --   --  13.4  --   HCT 43.6 49.0  --   --   --  40.1  --   PLT 131*  --   --   --   --  121*  --   APTT 28  --   --   --   --   --   --   LABPROT 15.1  --   --   --   --   --   --   INR 1.18  --   --   --   --   --   --   HEPARINUNFRC  --   --   --   --  0.40 0.11* 1.42*  CREATININE 1.68* 1.60*  --   --   --  1.50*  --   TROPONINI  --   --   < > 17.01* 16.22* 13.21*  --   < > = values in this interval not displayed.  Estimated Creatinine Clearance: 44.8 mL/min (by C-G formula based on Cr of 1.5).   Medical History: Past Medical History  Diagnosis Date  . Hypertension   . Chronic systolic heart failure     a. NICM - EF 35% with normal cors 2003. b. Last echo 11/2012 - EF 25-30%.  . Depression   . GERD (gastroesophageal reflux disease)   . Portal vein thrombosis   . Avascular necrosis of bones of both hips     Status post bilateral hip replacements  . Gastritis   . Alcohol abuse   . Erectile dysfunction   . Bipolar disorder   . Hydrocele, unspecified   . Intermittent claudication   . Lung nodule     Chest CT scan on 12/14/2008 showed a nodular opacity at the left lung base felt to most likely represent scarring.  Follow-up chest CT scan on 06/02/2010 showed parenchymal scarring in the left apex, left  lower lobe, and lingula; some of this scarring at the  left base had a nodular appearance, unchanged. Chest 09/2012 - stable.  . 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  Thyroid scan 08/28/2009 was normal with no focal areas of abnormal increased or decreased activity seen; the uptake of I 131 sodium iodide at 24 hours was 5.7%.  TSH and free T4 normalized by 08/16/2009.  . Intestinal obstruction   . COPD (chronic obstructive pulmonary disease)   . Clostridium difficile colitis   . E coli bacteremia   . DVT (deep venous thrombosis)     He has hypercoagulability with multiple prior DVTs  . Pleural plaque     H/o asbestos exposure. Chest CT on 06/02/2010 showed stable extensive calcified pleural plaques involving the left hemithorax, consistent with asbestos related pleural disease.  [name]  Weight loss     Normal colonoscopy by Dr. Olevia Perches on 11/06/2010.  . Tachycardia-bradycardia syndrome     a. 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 b. St. Jude gen change 2010.  [name] PAF (paroxysmal atrial fibrillation)     Paroxysmal, failed medical therapy with amiodarone but has done very well with multaq;  d/c 2/2 to recurrent AFib;  Tikosyn load c/b Hyperkalemia during admx 05/2013 => rate control strategy  . 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  . Noncompliance   . Atrial flutter   . Thrombocytopenia   . Hyperkalemia     type 4 RTA  . Pacemaker   . Shortness of breath     "all the time now" (07/02/2013)  . On home oxygen therapy     "3L prn; need it maybe 3-4 times/wk" (10/11/2014)  . Pneumonia   . Cluster headaches     "~ twice/wk" (07/02/2013)  . Osteoarthritis   . Spondylosis   . Arthritis     "hips and legs" (07/02/2013)  . Chronic back pain     "all over my back" (07/02/2013)  . Anxiety   . CKD (chronic kidney disease)     Renal U/S 12/04/2009 showed no pathological findings. Labs 12/04/2009 include normal ESR,  C3, C4; neg ANA; SPEP showed nonspecific increase in the alpha-2 region with no M-spike; UPEP showed no monoclonal free light chains; urine IFE showed polyclonal increase in feree Kappa and/or free Lambda light chains. Baseline Cr reported 1.7-2.5.  . STEMI (ST elevation myocardial infarction) 10/11/2014    Medications:  See electronic med rec  Assessment: 69 y.o. male admitted as possible code stroke - symptoms resolved w/in 10 min so cancelled. Spoke with Neuro - considering this TIA. Head CT neg. Not able to do MRI due to pacer. Pt on coumadin PTA for PAF, h/o DVT. Admit INR 1.18. Pt admits noncompliance. Found to have MI with troponin 25.8. To begin heparin for ACS. Neuro ok with doing full-dose heparin with bolus for MI. Pt with CKD (est CrCl 30 ml/min). H/H ok at baseline, Plt slightly low 131 (160s ~49mo ago).  Hepairn level was subtherapeutic this morning and rate increased to 1050 units/hr. Now heparin level is supratherapeutic at 1.42. Pt had L femoral thrombectomy this afternoon. Per procedure record. Heparin was not stopped during procedure, and pt. Also received additional 4000 units bolus at 1200. Didn't have reversal of heparin during procedure. No issue with bleeding per RN.   Goal of Therapy:  Heparin level 0.3-0.7 units/ml  Monitor platelets by anticoagulation protocol: Yes   Plan:  Hold heparin for 1 hr, then restart heparin 950 units/hr at 1930 F/u AM heparin level and CBC  MMaryanna Shape PharmD, BCPS  Clinical Pharmacist  Pager: [phone]  10/12/2014,6:22 PM

## 2014-10-12 NOTE — Anesthesia Preprocedure Evaluation (Addendum)
Anesthesia Evaluation  Patient identified by MRN, date of birth, ID band Patient awake    Reviewed: Allergy & Precautions, NPO status   Airway Mallampati: II  TM Distance: >3 FB Neck ROM: Full    Dental  (+) Edentulous Upper, Edentulous Lower   Pulmonary shortness of breath, COPDCurrent Smoker,  breath sounds clear to auscultation        Cardiovascular hypertension, + Past MI, + Peripheral Vascular Disease and +CHF + pacemaker Rhythm:Irregular Rate:Tachycardia  A Fib, low EF from chart  And recent ECHO   Neuro/Psych TIA   GI/Hepatic GERD-  ,  Endo/Other  Hyperthyroidism   Renal/GU Renal disease     Musculoskeletal   Abdominal   Peds  Hematology   Anesthesia Other Findings   Reproductive/Obstetrics                            Anesthesia Physical Anesthesia Plan  ASA: IV and emergent  Anesthesia Plan: General   Post-op Pain Management:    Induction: Intravenous  Airway Management Planned: Oral ETT  Additional Equipment:   Intra-op Plan:   Post-operative Plan: Extubation in OR  Informed Consent: I have reviewed the patients History and Physical, chart, labs and discussed the procedure including the risks, benefits and alternatives for the proposed anesthesia with the patient or authorized representative who has indicated his/her understanding and acceptance.     Plan Discussed with: CRNA  Anesthesia Plan Comments:         Anesthesia Quick Evaluation

## 2014-10-12 NOTE — Anesthesia Procedure Notes (Signed)
Procedure Name: Intubation Date/Time: 10/12/2014 11:26 AM Performed by: Rebekah Chesterfield L Pre-anesthesia Checklist: Patient identified, Emergency Drugs available, Suction available, Patient being monitored and Timeout performed Patient Re-evaluated:Patient Re-evaluated prior to inductionOxygen Delivery Method: Circle system utilized Preoxygenation: Pre-oxygenation with 100% oxygen Intubation Type: IV induction Ventilation: Mask ventilation without difficulty Laryngoscope Size: Mac and 3 Grade View: Grade I Tube type: Oral Tube size: 8.0 mm Number of attempts: 1 Airway Equipment and Method: Stylet Placement Confirmation: ETT inserted through vocal cords under direct vision,  positive ETCO2 and breath sounds checked- equal and bilateral Secured at: 22 cm Tube secured with: Tape Dental Injury: Teeth and Oropharynx as per pre-operative assessment

## 2014-10-12 NOTE — Progress Notes (Signed)
Date: 10/12/2014  Patient name: [name]  Medical record number: [id number]  Date of birth: [birth year]   I have seen and evaluated [name] and discussed their care with the Residency Team.  Briefly, Mr. 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  Per report, Mr. Brandow noted that he was last seen normal at 12pm on day of admission.  He couldn't remember what happened, however, his friend reported that he had slurred speech, weakness and numbness on the right and he was confused.  These had mostly resolved by the time he was seen in the ED.  CT head was negative (he cannot have an MRI given PM in place).  Further symptoms include weight loss, decreased appetite, SOB. Denied chest pain.    Today (5/25) patient had acute left leg pain and loss of pulses concerning for acute limb ischemia (thrombus vs. Embolus) and he was taken acutely for vascular intervention.  I saw him in the PACU and he was tired, resting in bed, NAD.  He reported no further neurological symptoms (reported to my medical student, continued mild numbness on the face).  He is thin for his height, non icteric sclerae, MMM, Irreg Irreg, no murmur, abdomen soft NT with good bowel sounds, lungs clear anteriorly without wheezing, extremities are thin, pulses now palpable in both DP, however left leg is notably cooler than the right.    Labs on admission showed Cr. Of 1.68, elevated AST, INR of 1.18 (despite being on coumadin, he reports limited compliance) and most concerningly he had an I stat troponin of 25.  CXR showed chronic stable changes with asbestos related plaques.  Initial EKG was concerning for ST elevation in the inferior leads.  Cardiology and Neurology were consulted.    Assessment and Plan: I have seen and  evaluated the patient as outlined above. I agree with the formulated Assessment and Plan as detailed in the residents' admission note, with the following changes:   1. STEMI - Cardiology note reviewed, patient noted to be chest pain free.  - IV heparin and cardizem started - TTE planned  - Troponin to be cycled - Monitor closely for changes - EKG for any change to symptoms of SOB or any new Chest pain - EKG daily - ? LHC in the near future for STEMI (not noted in Cardiology note) - Telemetry  2. TIA vs. Stroke - Symptoms resolved within the window for a TIA - Neurology saw the patient and recommended the usual work up - Heparin IV started - Telemetry - TTE, CD planned - A1C, FLP - Frequent neuro checks  3. Embolus to left leg (presumed) - Vascular surgery consulted and took patient to OR for removal - IV heparin ongoing  4. Atrial fibrillation - Now rate controlled - Initially started on cardizem drip, and transitioned to lopressor - He was non compliant with coumadin in the outpatient setting, INR low at 1.18 on admission  5. AKI on CKD - Check urine studies as noted in Dr. Claris Gladden H&P  Other ongoing issues include HTN, transaminitis (trend LFTs), Chronic combined heart failure, COPD.   Trying to put the picture together, it is very possible that Mr. Reiber is having embolic phenomena vs. Hypercoaguability.  He has now had STEMI, TIA and embolus to  his left leg.  He has AKI which could be related to embolic phenomena to the kidneys.  He has had an arterial embolus to the right leg as well.  He will need work up for a nidus of embolization (uncontrolled afib and noncompliance with coumadin make an intracardiac clot a good starting point to look into) as well as work up for malignancy given his age and new onset of clotting.  He also has weight loss and decreased appetite, smoking history and exposure to asbestos which makes cancer more concerning.  I have asked the resident team  to do a thorough chart review to see what work up for malignancy he has had to date, which will help guide [place] on our investigation.  We will discuss possible TEE vs. Repeat TTE with Cardiology now that he is rate controlled.  This is a complicated case and he will need close monitoring in the SDU.     Sid Falcon, MD 5/25/20163:39 PM

## 2014-10-12 NOTE — Transfer of Care (Signed)
Immediate Anesthesia Transfer of Care Note  Patient: [name]  Procedure(s) Performed: Procedure(s): Left Femoral Thrombectomy with intraoperative arteriogram (Left)  Patient Location: PACU  Anesthesia Type:General  Level of Consciousness: awake, sedated, patient cooperative and responds to stimulation  Airway & Oxygen Therapy: Patient Spontanous Breathing and Patient connected to face mask oxygen  Post-op Assessment: Report given to RN, Post -op Vital signs reviewed and stable and Patient moving all extremities  Post vital signs: Reviewed and stable  Last Vitals:  Filed Vitals:   10/12/14 0800  BP: 120/92  Pulse:   Temp: 36.8 C  Resp:     Complications: No apparent anesthesia complications

## 2014-10-12 NOTE — Progress Notes (Signed)
Dr. Baltazar Apo made aware of patient going emergency surgery.  [name]

## 2014-10-12 NOTE — Progress Notes (Signed)
Patient requested to see nurse pertaining to pain in his left calf.  RN into room and patient stated his left leg from his knee down was now numb, stating the pain and numbness he was feeling felt like his right leg did prior to the surgery he had in 2015.  Internal Medicine residency returned page and was updated, MD stated he would come see patient.

## 2014-10-12 NOTE — Care Management Note (Addendum)
Case Management Note  Patient Details  Name: [name] MRN: [id number] Date of Birth: 06/05/1945  Subjective/Objective:   Pt admitted for slurred speech, facial droop, dysarthria. Increased troponin.               Action/Plan: 10-18-14 1116 CM did speak with pt in regards to disposition. Pt has a son that lives with him, however the son works during the day. Per PT pt will not need PT f/u @ home and will need supervision intermittently. Pt states he has life alert if anything was to happen. Pt states he is active with [facility]. CM did call Liaison to verify. Pt has been contacted by [facility] in the past- per pt only phone calls. The Liaison stated they were trying to get him assistance via the [place].  Consult placed for [facility] to speak with pt. Pt will need HHRN and Aide order for home. CM will continue to monitor for disposition needs.    CM will continue to monitor for disposition needs.   Expected Discharge Date:                  Expected Discharge Plan:  [place]  In-House Referral:     Discharge planning Services  CM Consult  Post Acute Care Choice:    Choice offered to:     DME Arranged:    DME Agency:     HH Arranged:    [place] Agency:     Status of Service:     Medicare Important Message Given:   Yes Date Medicare IM Given:   10-14-14 Medicare IM give by:   Jacqlyn Krauss, RN,BSN  Date Additional Medicare IM Given: 10-18-14   Additional Medicare Important Message give by:   Jacqlyn Krauss, RN,BSN   If discussed at Long Length of Stay Meetings, dates discussed:    Additional Comments:  Bethena Roys, RN 10/12/2014, 3:14 PM

## 2014-10-12 NOTE — Progress Notes (Addendum)
Subjective: This am pt c/o 8/10 left leg pain, decreased sensation all of a sudden, denies chest pain, sob intermittently.   Objective: Vital signs in last 24 hours: Filed Vitals:   10/12/14 0257 10/12/14 0310 10/12/14 0400 10/12/14 0800  BP:   116/79 120/92  Pulse:   78   Temp: 98.6 F (37 C)  98.6 F (37 C) 98.3 F (36.8 C)  TempSrc: Oral  Oral Oral  Resp:   21   Weight:   152 lb 5.4 oz (69.1 kg)   SpO2:  99% 100%    Weight change:   Intake/Output Summary (Last 24 hours) at 10/12/14 0838 Last data filed at 10/12/14 3825  Gross per 24 hour  Intake    480 ml  Output    375 ml  Net    105 ml   Vitals reviewed. General: resting in bed, NAD HEENT: PERRL b/l, [place]/at, no scleral icterus Cardiac: AF, no rubs, murmurs or gallops Pulm: wheezing b/l  Abd: soft, nontender, nondistended, BS present Ext: cool left extremity right warm and well perfused, no pedal edema, not able to doppler LLE pulses Neuro: alert and oriented X3, cranial nerves II-XII grossly intact, strength 5/5 3/4 extremities, poor cooperation LLE strength due to pain, decreased sensation V1 face, nl sensation right upper and lower extremity, decreased sensation LLE, nl coordination, no slurred speech, nl heel shin, nl plantar and dorsiflexion right, poor effort LLE  Lab Results: Basic Metabolic Panel:  Recent Labs Lab 10/11/14 1205 10/11/14 1310  10/11/14 2136 10/12/14 0426  NA 143 143  --   --   --   K 4.1 4.0  --   --   --   CL 109 107  --   --   --   CO2 24  --   --   --   --   GLUCOSE 116* 114*  --   --   --   BUN 17 20  --   --   --   CREATININE 1.68* 1.60*  --   --   --   CALCIUM 9.3  --   --   --   --   MG  --   --   < > 1.8 1.8  < > = values in this interval not displayed. Liver Function Tests:  Recent Labs Lab 10/11/14 1205 10/12/14 0426  AST 72* 57*  ALT 15* 17  ALKPHOS 76 66  BILITOT 1.2 1.2  PROT 7.2 5.8*  ALBUMIN 3.7 3.0*   CBC:  Recent Labs Lab 10/11/14 1205  10/11/14 1310 10/12/14 0426  WBC 7.8  --  5.9  NEUTROABS 5.4  --   --   HGB 14.6 16.7 13.4  HCT 43.6 49.0 40.1  MCV 90.8  --  91.3  PLT 131*  --  121*   Cardiac Enzymes:  Recent Labs Lab 10/11/14 1903 10/11/14 2136 10/12/14 0426  TROPONINI 17.01* 16.22* 13.21*   Hemoglobin A1C:  Recent Labs Lab 10/11/14 1526  HGBA1C 5.6   Fasting Lipid Panel:  Recent Labs Lab 10/12/14 0426  CHOL 92  HDL 36*  LDLCALC 50  TRIG 31  CHOLHDL 2.6   Thyroid Function Tests:  Recent Labs Lab 10/11/14 2136  TSH 2.031   Coagulation:  Recent Labs Lab 10/11/14 1205  LABPROT 15.1  INR 1.18   Urine Drug Screen: Drugs of Abuse     Component Value Date/Time   LABOPIA NONE DETECTED 10/11/2014 1724   [place] 12/27/2008 1943  COCAINSCRNUR NONE DETECTED 10/11/2014 1724   COCAINSCRNUR NEG 07/06/2009 2225   LABBENZ NONE DETECTED 10/11/2014 1724   LABBENZ NEG 07/06/2009 2225   AMPHETMU NONE DETECTED 10/11/2014 1724   AMPHETMU NEG 07/06/2009 2225   THCU POSITIVE* 10/11/2014 1724   LABBARB NONE DETECTED 10/11/2014 1724    Alcohol Level:  Recent Labs Lab 10/11/14 1305  ETH <5   Urinalysis:  Recent Labs Lab 10/11/14 1724  COLORURINE YELLOW  LABSPEC 1.010  PHURINE 7.0  GLUCOSEU NEGATIVE  HGBUR MODERATE*  BILIRUBINUR NEGATIVE  KETONESUR NEGATIVE  PROTEINUR NEGATIVE  UROBILINOGEN 1.0  NITRITE NEGATIVE  LEUKOCYTESUR MODERATE*   Misc. Labs: none  Micro Results: Recent Results (from the past 240 hour(s))  MRSA PCR Screening     Status: None   Collection Time: 10/11/14  5:29 PM  Result Value Ref Range Status   MRSA by PCR NEGATIVE NEGATIVE Final    Comment:        The GeneXpert MRSA Assay (FDA approved for NASAL specimens only), is one component of a comprehensive MRSA colonization surveillance program. It is not intended to diagnose MRSA infection nor to guide or monitor treatment for MRSA infections.    Studies/Results: Ct Head Wo  Contrast  10/11/2014   CLINICAL DATA:  Code stroke. RIGHT-sided facial droop with slurred speech.  EXAM: CT HEAD WITHOUT CONTRAST  TECHNIQUE: Contiguous axial images were obtained from the base of the skull through the vertex without intravenous contrast.  COMPARISON:  CT head 10/12/2012.  FINDINGS: No evidence for acute infarction, hemorrhage, mass lesion, hydrocephalus, or extra-axial fluid. Normal for age cerebral volume. No white matter disease. Calvarium intact. No sinus or mastoid fluid.  IMPRESSION: Negative exam.  No change from priors.  Critical Value/emergent results were called by telephone at the time of interpretation on 10/11/2014 at 1:12 pm to Dr. Janann Colonel , who verbally acknowledged these results.   Electronically Signed   By: Rolla Flatten M.D.   On: 10/11/2014 13:14   Dg Chest Port 1 View  10/11/2014   CLINICAL DATA:  Shortness of breath.  EXAM: PORTABLE CHEST - 1 VIEW  COMPARISON:  10/11/2014 and 01/17/2014  FINDINGS: Right-sided pacemaker unchanged. Lungs are adequately inflated with stable chronic elevation of the left hemidiaphragm and stable chronic changes over the left lung. Known calcified pleural plaques. No focal lobar consolidation or effusion cardiomediastinal silhouette and remainder of the exam is unchanged.  IMPRESSION: No active disease.  Chronic stable changes of the left lung. Evidence of asbestos related pleural disease.   Electronically Signed   By: Marin Olp M.D.   On: 10/11/2014 21:56   Dg Chest Portable 1 View  10/11/2014   CLINICAL DATA:  Right facial droop  EXAM: PORTABLE CHEST - 1 VIEW  COMPARISON:  01/17/2014  FINDINGS: Cardiac shadow is again mildly enlarged. A pacing device is again seen. Chronic left pleural effusion and left basilar changes are noted although they have increased in the interval from the prior exam. The right lung remains clear. The visualized upper abdomen is unremarkable.  IMPRESSION: Increasing chronic changes in the left lung base when  compare with the prior exam. Calcified pleural plaques are again noted.   Electronically Signed   By: Inez Catalina M.D.   On: 10/11/2014 14:06   Medications:  Scheduled Meds: . aspirin EC  81 mg Oral Daily  . buPROPion  75 mg Oral BID  . magnesium sulfate 1 - 4 g bolus IVPB  1 g Intravenous Once  . metoprolol  25 mg Oral BID  . pantoprazole  40 mg Oral Daily  . tamsulosin  0.4 mg Oral Daily  . tiotropium  18 mcg Inhalation Daily   Continuous Infusions: . diltiazem (CARDIZEM) infusion 5 mg/hr (10/12/14 1572)  . heparin 1,050 Units/hr (10/12/14 0606)   PRN Meds:.ipratropium-albuterol, metoprolol, oxyCODONE-acetaminophen, senna-docusate Assessment/Plan: 69 y.o with multiple medical problems presents for right facial droop, confusion, slurred speech, numbness in extremity since 11 am found to have STEMI with troponin elevation to >25.    #Concern for STEMI (ST elevation myocardial infarction) with elevated tropnonin >25  -Cards consulted. Pt is chest pain free. Per PA this am no plans to cath they think he is throwing clots (cardioembolic) -will ask cards about TEE -morphine 1q4 prn (pt refused and prefers home Percocet), O2, prn NTG (dc'ed in setting of concern for right infarction) , Aspirin 81 in am, Lopressor 25 mg bid, Lopressor 2.5 mg q5 mg prn  -Heparin gtt per pharm  -trop x 3 (25.83>20.70>17.01>16.22>13.21), EKG prn and in the am -trend trop x 2 more times   #left lower leg rest pain -concern for emboli. Unable to palpate pulse -ABI, arterial doppler left leg ordered -Vascular consulted will take to OR this am  #Concern for stroke vs TIA -PT,OT, SLP (passed swallow bedside) ,HA1C 5.6, lipid LDL 50 -TTE with bubble (limited with EF 30-35% diffuse HK, mild  AR, RA mildly dilated, systolic function moderately reduced, RA mildly dilated, neg emboli -Consider TEE -US carotids pending  -Neuro checks  -Unable to get MRI due to pacer   #Chronic Atrial fibrillation with RVR  now with h/o tachy/brady syndrome s/p pacer -HR was 164 on admission improved  -cards following  -Given Dilt 20 iv x 1 and placed on Dilt gtt, Lopressor 25 mg bid, prn Lopressor  -limited echo (limited due to AF with RVR per tech),tsh wnl, Mag 1.8 given 1 g -consider repeat echo when rate controlled  -subtherapeutic INR 1.18 on admission N/C with Coumadin prior to admission on Hep gtt   #H/o HTN -normally with TIA/stroke would allow permissive HTN spoke with neuro who agrees to treat STEMI -monitor BP  #? AKI on CKD 3 -Cr. 1.68-1.60 on admission Cr. 1.33 on 08/10/14 with variable baseline  -will ck urine lytes  -strict i/o, daily weights  -pending BMET  #Transaminitis  -trending down  #Trich on UA -will give Flagyl 2 g  #Chronic combined systolic and diastolic congestive heart failure -stable but echo with reduced EF 30-35% -last echo 12/16/13 with EF 45-50% LA and RA severely dilated. From 2003-2015 EF has varied from 25-50% with notable grade 1 DD, diffuse HK noted on 04/2013 echo and also PAP 42 mmHG -strict i/o, daily weights   #H/o multiple emboli (h/o DVT) -per chart review pt is hypercoaguable but do not see a work up previously -will order hypercoag panel   #H/o COPD  -stable resumed Spiriva, prn Duoneb -smoking cessation smokes 1 ppd to every 2 days   #Chronic left pleural effusion/pleural plaques noted CXR  #H/o BPH -resume Flomax   #H/o GERD -Protonix 40 mg qd on Prilosec 40 mg qd   #Chronic pain  -on percocet 10-325 q4 prn held for now getting prn morphine 1 q4   #H/o depression  -resumed Wellbutrin 75 mg qd   #THC abuse  -UDS + THC on admission   #Thrombocytopenia  -plts 121  -will trend CBC  #F/E/N -none  -BMET in am  -MAg 1.8 given 1 g -H/H diet   #  DVT px -heparin gtt, scds  -will need to be back on Coumadin when able    Dispo: Disposition is deferred at this time, awaiting improvement of current medical problems.  Anticipated  discharge in approximately 3-4 day(s).   The patient does have a current PCP Bertha Stakes, MD) and does need an [facility] hospital follow-up appointment after discharge.  The patient does not have transportation limitations that hinder transportation to clinic appointments.  .Services Needed at time of discharge: Y = Yes, Blank = No PT:   OT:   RN:   Equipment:   Other:     LOS: 1 day   Cresenciano Genre, MD 10/12/2014, 8:38 AM

## 2014-10-12 NOTE — Consult Note (Signed)
Patient name: [name] MRN: [id number] DOB: Oct 27, 1945 Sex: male   Referred by: Internal medicine  Reason for referral:  Chief Complaint  Patient presents with  . Code Stroke    HISTORY OF PRESENT ILLNESS: Patient is a 69 year old gentleman known to me from acute occlusion of his right leg in July 2015. He presented with profound ischemia and underwent thrombectomy of his right leg and eventual fasciotomy. He had the ongoing neurologic deficit due to his prolonged profound ischemia. He was supposed to be on anticoagulation but is not been taking this. He was admitted yesterday with, myocardial infarction. Was being worked up for this and this morning noted cold numb left leg. He denied any prior left leg symptoms. His right leg and healed completely but continued to have some numbness to do his preoperative ischemia in the past.  Past Medical History  Diagnosis Date  . Hypertension   . Chronic systolic heart failure     a. NICM - EF 35% with normal cors 2003. b. Last echo 11/2012 - EF 25-30%.  . Depression   . GERD (gastroesophageal reflux disease)   . Portal vein thrombosis   . Avascular necrosis of bones of both hips     Status post bilateral hip replacements  . Gastritis   . Alcohol abuse   . Erectile dysfunction   . Bipolar disorder   . Hydrocele, unspecified   . Intermittent claudication   . Lung nodule     Chest CT scan on 12/14/2008 showed a nodular opacity at the left lung base felt to most likely represent scarring.  Follow-up chest CT scan on 06/02/2010 showed parenchymal scarring in the left apex, left  lower lobe, and lingula; some of this scarring at the left base had a nodular appearance, unchanged. Chest 09/2012 - stable.  . 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  Thyroid scan 08/28/2009 was normal with no focal areas of abnormal increased or decreased activity seen; the uptake of I 131 sodium iodide at 24 hours  was 5.7%.  TSH and free T4 normalized by 08/16/2009.  . Intestinal obstruction   . COPD (chronic obstructive pulmonary disease)   . Clostridium difficile colitis   . E coli bacteremia   . DVT (deep venous thrombosis)     He has hypercoagulability with multiple prior DVTs  . Pleural plaque     H/o asbestos exposure. Chest CT on 06/02/2010 showed stable extensive calcified pleural plaques involving the left hemithorax, consistent with asbestos related pleural disease.  . Weight loss     Normal colonoscopy by Dr. Olevia Perches on 11/06/2010.  . Tachycardia-bradycardia syndrome     a. 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 b. St. Jude gen change 2010.  [name] PAF (paroxysmal atrial fibrillation)     Paroxysmal, failed medical therapy with amiodarone but has done very well with multaq;  d/c 2/2 to recurrent AFib;  Tikosyn load c/b Hyperkalemia during admx 05/2013 => rate control strategy  . 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  . Noncompliance   . Atrial flutter   . Thrombocytopenia   . Hyperkalemia     type 4 RTA  . Pacemaker   . Shortness of breath     "all the time now" (07/02/2013)  . On home oxygen therapy     "3L prn; need it maybe 3-4 times/wk" (10/11/2014)  .  Pneumonia   . Cluster headaches     "~ twice/wk" (07/02/2013)  . Osteoarthritis   . Spondylosis   . Arthritis     "hips and legs" (07/02/2013)  . Chronic back pain     "all over my back" (07/02/2013)  . Anxiety   . CKD (chronic kidney disease)     Renal U/S 12/04/2009 showed no pathological findings. Labs 12/04/2009 include normal ESR, C3, C4; neg ANA; SPEP showed nonspecific increase in the alpha-2 region with no M-spike; UPEP showed no monoclonal free light chains; urine IFE showed polyclonal increase in feree Kappa and/or free Lambda light chains. Baseline Cr reported 1.7-2.5.  . STEMI (ST elevation myocardial infarction) 10/11/2014     Past Surgical History  Procedure Laterality Date  . Hemiarthroplasty hip Left 09/09/2002    bipolar; Dr. [place] Dragon  . Pacemaker insertion  02/2003    Dr. Roe Rutherford, [place] pulse generator identity SR model [id number] serial [id number]; gen change by Greggory Brandy  . Total hip arthroplasty Right 03/08/2008     By Dr. Hiram Comber  . Tee without cardioversion N/A 05/27/2013    Procedure: TRANSESOPHAGEAL ECHOCARDIOGRAM (TEE);  Surgeon: Candee Furbish, MD;  Location: [facility] ENDOSCOPY;  Service: Cardiovascular;  Laterality: N/A;  . Cardioversion N/A 05/27/2013    Procedure: CARDIOVERSION;  Surgeon: Candee Furbish, MD;  Location: [facility] ENDOSCOPY;  Service: Cardiovascular;  Laterality: N/A;  . Cardiac catheterization  2003    Nl cors, EF 35%  . Pacemaker placement  06/17/2008    Lead and generator change w/ St. Jude Medical Tendril ST model [id number] (serial  [id number]).       . Insert / replace / remove pacemaker    . Embolectomy Right 12/10/2013    Procedure: Right Femoral Embolectomy; Fasciotomy Right Lower Leg with Intraoperative arteriogram;  Surgeon: Rosetta Posner, MD;  Location: [place];  Service: Vascular;  Laterality: Right;  . Intraoperative arteriogram Right 12/10/2013    Procedure: INTRA OPERATIVE ARTERIOGRAM;  Surgeon: Rosetta Posner, MD;  Location: [place];  Service: Vascular;  Laterality: Right;  . Fasciotomy closure Right 12/15/2013    Procedure: LEG FASCIOTOMY CLOSURE;  Surgeon: Rosetta Posner, MD;  Location: [place];  Service: Vascular;  Laterality: Right;    History   Social History  . Marital Status: Widowed    Spouse Name: N/A  . Number of Children: 10  . Years of Education: N/A   Occupational History  . RETIRED    Social History Main Topics  . Smoking status: Current Every Day Smoker -- 1.00 packs/day for 54 years    Types: Cigarettes  . Smokeless tobacco: Never Used  . Alcohol Use: 0.0 oz/week    0 Standard drinks or equivalent per week     Comment: 10/11/2014"h/o etoh abuse; stopped  drinking ~ 2012"  . Drug Use: 1.00 per week    Special: Marijuana     Comment: 10/11/2014 "might smoke marijuana once/month"  . Sexual Activity: Not Currently   Other Topics Concern  . Not on file   Social History Narrative   Works as a Architect part time   Smoker 1 pack last 1.5 days tobacco, smokes marijuana    Denies EtOH x 4 years (on 10/12/12)   9 kids    From [place]   [place] Veteran    12 grade education              Family History  Problem Relation Age of Onset  . Hypertension  Mother   . Cancer Brother     Unsure of type.  . Asthma Mother   . Coronary artery disease Mother   . Colon cancer Neg Hx   . Lung cancer Neg Hx   . Prostate cancer Neg Hx   . Stroke      mother  . Heart attack      brother and sister ?age     Allergies as of 10/11/2014 - Review Complete 10/11/2014  Allergen Reaction Noted  . Digoxin and related Other (See Comments) 05/17/2013  . Penicillins Rash     No current facility-administered medications on file prior to encounter.   Current Outpatient Prescriptions on File Prior to Encounter  Medication Sig Dispense Refill  . albuterol (VENTOLIN HFA) 108 (90 BASE) MCG/ACT inhaler Inhale 2 puffs into the lungs every 6 (six) hours as needed for wheezing or shortness of breath. 1 Inhaler 3  . betamethasone dipropionate (DIPROLENE) 0.05 % cream Apply to affected areas on back and arm once daily for 2 weeks.  Do not use on face or in skin folds. 45 g 0  . buPROPion (WELLBUTRIN) 75 MG tablet Take 1 tablet (75 mg total) by mouth 2 (two) times daily. 60 tablet 5  . diltiazem (CARDIZEM CD) 240 MG 24 hr capsule Take 1 capsule (240 mg total) by mouth 2 (two) times daily. 60 capsule 0  . metoprolol (LOPRESSOR) 50 MG tablet Take 0.5 tablets (25 mg total) by mouth 2 (two) times daily. 60 tablet 1  . omeprazole (PRILOSEC) 20 MG capsule Take 2 capsules (40 mg total) by mouth daily. 60 capsule 5  . oxyCODONE-acetaminophen (PERCOCET) 10-325 MG per tablet  Take 1 tablet by mouth every 4 (four) hours as needed for pain. Do not take more than 5 doses in a 24 hour period. 150 tablet 0  . senna-docusate ([employer] SENNA PLUS) 8.6-50 MG per tablet Take 1 tablet by mouth daily as needed (for constipation). 30 tablet 1  . sodium bicarbonate 650 MG tablet Take 2 tablets (1,300 mg total) by mouth 2 (two) times daily. 120 tablet 3  . tamsulosin (FLOMAX) 0.4 MG CAPS capsule Take 1 capsule (0.4 mg total) by mouth daily. 30 capsule 3  . tiotropium (SPIRIVA) 18 MCG inhalation capsule Place 1 capsule (18 mcg total) into inhaler and inhale daily. 30 capsule 5  . triamcinolone cream (KENALOG) 0.1 % Apply 1 application topically 2 (two) times daily. Use on affected areas on chest for 2 weeks. 30 g 1  . lactose free nutrition (BOOST PLUS) LIQD Take 237 mLs by mouth 3 (three) times daily with meals. (Patient not taking: Reported on 08/10/2014) 90 Can 0  . warfarin (COUMADIN) 5 MG tablet TAKE AS DIRECTED BY THE ANTICOAGULATION CLINIC PROVIDER. (Patient not taking: Reported on 10/11/2014) 50 tablet 1     PHYSICAL EXAMINATION:  General: The patient is a well-nourished male, in no acute distress. Vital signs are BP 120/92 mmHg  Pulse 78  Temp(Src) 98.3 F (36.8 C) (Oral)  Resp 21  Wt 152 lb 5.4 oz (69.1 kg)  SpO2 100% Pulmonary: There is a good air exchange \ Abdomen: Soft and non-tender Musculoskeletal: There are no major deformities.  There is no significant extremity pain. Neurologic: Art sensory deficit to his left foot. He reports that this is better than his earlier this morning when he had no feeling whatsoever. Skin: There are no ulcer or rashes noted. Psychiatric: The patient has normal affect. Cardiovascular: Palpable femoral pulses bilaterally. Easily palpable  popliteal pulse on the right. On the left he has no popliteal or distal pulses. He has no audible flow in his left foot. Has biphasic posterior tibial signal on the right     Impression and  Plan:  Acute ischemia of left leg with presumed embolus. He is on heparin. Will take emergently to the operating room for exploration and thrombectomy. Doubt he will require fasciotomy since this just occurred within the last several hours.    Curt Jews [facility] Office: [phone]

## 2014-10-12 NOTE — Anesthesia Postprocedure Evaluation (Signed)
  Anesthesia Post-op Note  Patient: [name]  Procedure(s) Performed: Procedure(s): Left Femoral Thrombectomy with intraoperative arteriogram (Left)  Patient Location: PACU  Anesthesia Type:General  Level of Consciousness: awake and alert   Airway and Oxygen Therapy: Patient Spontanous Breathing  Post-op Pain: mild  Post-op Assessment: Post-op Vital signs reviewed and Patient's Cardiovascular Status Stable  Post-op Vital Signs: stable  Last Vitals:  Filed Vitals:   10/12/14 1424  BP: 96/68  Pulse: 50  Temp:   Resp: 13    Complications: No apparent anesthesia complications

## 2014-10-12 NOTE — Op Note (Signed)
    OPERATIVE REPORT  DATE OF SURGERY: 10/12/2014  PATIENT: [name], 69 y.o. male MRN: [id number]  DOB: 06-25-1945  PRE-OPERATIVE DIAGNOSIS: Embolus to left leg  POST-OPERATIVE DIAGNOSIS:  Same  PROCEDURE: Left femoral embolectomy and intraoperative arteriogram  SURGEON:  Curt Jews, M.D.  PHYSICIAN ASSISTANT: Trinh  ANESTHESIA:  Gen.  EBL: 50 ml     BLOOD ADMINISTERED: None  DRAINS: None  SPECIMEN: Left femoral embolus  COUNTS CORRECT:  YES  PLAN OF CARE: PACU   PATIENT DISPOSITION:  PACU - hemodynamically stable  PROCEDURE DETAILS: The patient was taken to the operating placed supine position where the area the left groin left leg prepped draped in usual sterile fashion. Incision was made over the left femoral pulse. On isolate the common superficial femoral and profundus femoris arteries. Usual encircled with Vesseloops. The patient was on the heparin drip. He is given additional bolus of 4000 units of heparin. After circulation time the common superficial femoral and profundus femoris arteries were occluded. The common femoral artery was opened transversely and there was thrombus present. This was removed directly. Afebrile. 4 Fogarty catheter was passed proximally and the iliacs and no further clot was removed. This was reoccluded. Fogarty was then passed down the deep femoral and there was clot removed from the deep femoral with excellent backbleeding. Finally the catheter was sent down through the superficial femoral artery and was easily advanced all the way to the foot. This was withdrawn and there appeared to be some clot was in the proximal superficial femoral but no further thrombus was removed. One additional pass was taken to again with the catheter going all the way to the foot and no further thrombus was removed. The common femoral artery was closed with a running 60 proline suture. Clamps removed and good popliteal pulse was noted. No pedal pulses were  noted. For this reason the intraoperative arteriogram was obtained with a 21-gauge butterfly needle through the common femoral artery with inflow occlusion. This showed a normal popliteal artery with normal 3 vessel runoff. The butterfly needle was removed and the puncture was controlled with a 60 proline suture. The patient did not have reversal of the heparin. Wounds irrigated with saline and hemostasis obtained left cautery. Wounds were closed with 20 Vicryls several layers and the subcutaneous tissue and the skin was closed with 3-0 subcuticular Vicryls suture. Sterile dressing was applied   Curt Jews, M.D. 10/12/2014 12:57 PM

## 2014-10-12 NOTE — Progress Notes (Signed)
Inpatient Progress Note - Internal Medicine  Subjective: This morning the patient was acutely in pain, complaining of sudden onset severe pain, tingling, and numbness to touch of his LLE below the knee. He reports this feels identical to his previous RLE arterial thrombus. He received Percocet for this which did not alleviate the pain; he is refusing morphine as this gives him a bad headache.  Objective: Vital signs in last 24 hours: Filed Vitals:   10/12/14 0257 10/12/14 0310 10/12/14 0400 10/12/14 0800  BP:   116/79 120/92  Pulse:   78   Temp: 98.6 F (37 C)  98.6 F (37 C) 98.3 F (36.8 C)  TempSrc: Oral  Oral Oral  Resp:   21   Weight:   69.1 kg (152 lb 5.4 oz)   SpO2:  99% 100%    Weight change:   Intake/Output Summary (Last 24 hours) at 10/12/14 1030 Last data filed at 10/12/14 0607  Gross per 24 hour  Intake    480 ml  Output    375 ml  Net    105 ml   General: Elderly cachectic man in acute pain, gripping LLE and unable to keep LLE still. HEENT:  EOMI, sclerae/conjunctiva clear, mmm no oropharyngeal erythema, adentulate Cardiac: Mildly tachycardic, irregularly irregular rate, no m/r/g Pulmonary:  Diffuse mild wheezing. No crackles. Normal WOB Abdominal: soft, nt/nd, normoactive bowel sounds Extremities: No cyanosis or clubbing. No pedal edema. RLE cool with thready pulse. LLE warm on first check with no palpable pulse, on recheck progressively cooler with no palpable dorsal pedal or posterior tibial pulse. Skin:  Several verrucous lesions over bilateral feet Neurologic:  A&Ox3, speech content and affect appropriate. EOMI, PERRL.   CN II-XII intact except for mildly decreased sensation to light touch in R face.   Severely decreased sensation to light touch over LLE below knee; sensation normal superior to knee.   5/5 strength in BUE, BLE. No ataxia on rapid alternating movements, heel-to-shin, finger-to-nose.  Lab Results: Basic Metabolic Panel:  Recent Labs Lab  10/11/14 1205 10/11/14 1310  10/11/14 2136 10/12/14 0426  NA 143 143  --   --  141  K 4.1 4.0  --   --  4.5  CL 109 107  --   --  107  CO2 24  --   --   --  24  GLUCOSE 116* 114*  --   --  85  BUN 17 20  --   --  15  CREATININE 1.68* 1.60*  --   --  1.50*  CALCIUM 9.3  --   --   --  8.7*  MG  --   --   < > 1.8 1.8  < > = values in this interval not displayed. Liver Function Tests:  Recent Labs Lab 10/11/14 1205 10/12/14 0426  AST 72* 57*  ALT 15* 17  ALKPHOS 76 66  BILITOT 1.2 1.2  PROT 7.2 5.8*  ALBUMIN 3.7 3.0*   CBC:  Recent Labs Lab 10/11/14 1205 10/11/14 1310 10/12/14 0426  WBC 7.8  --  5.9  NEUTROABS 5.4  --   --   HGB 14.6 16.7 13.4  HCT 43.6 49.0 40.1  MCV 90.8  --  91.3  PLT 131*  --  121*   Cardiac Enzymes:  Recent Labs Lab 10/11/14 1903 10/11/14 2136 10/12/14 0426  TROPONINI 17.01* 16.22* 13.21*   Hemoglobin A1C:  Recent Labs Lab 10/11/14 1526  HGBA1C 5.6   Fasting Lipid Panel:  Recent Labs Lab 10/12/14 0426  CHOL 92  HDL 36*  LDLCALC 50  TRIG 31  CHOLHDL 2.6   Thyroid Function Tests:  Recent Labs Lab 10/11/14 2136  TSH 2.031   Coagulation:  Recent Labs Lab 10/11/14 1205  LABPROT 15.1  INR 1.18   Urine Drug Screen: Drugs of Abuse     Component Value Date/Time   LABOPIA NONE DETECTED 10/11/2014 1724   LABOPIA NEGATIVE 12/27/2008 1943   COCAINSCRNUR NONE DETECTED 10/11/2014 1724   COCAINSCRNUR NEG 07/06/2009 2225   LABBENZ NONE DETECTED 10/11/2014 1724   LABBENZ NEG 07/06/2009 2225   AMPHETMU NONE DETECTED 10/11/2014 1724   AMPHETMU NEG 07/06/2009 2225   THCU POSITIVE* 10/11/2014 1724   LABBARB NONE DETECTED 10/11/2014 1724    Alcohol Level:  Recent Labs Lab 10/11/14 1305  ETH <5   Urinalysis:  Recent Labs Lab 10/11/14 1724  COLORURINE YELLOW  LABSPEC 1.010  PHURINE 7.0  GLUCOSEU NEGATIVE  HGBUR MODERATE*  BILIRUBINUR NEGATIVE  KETONESUR NEGATIVE  PROTEINUR NEGATIVE  UROBILINOGEN 1.0   NITRITE NEGATIVE  LEUKOCYTESUR MODERATE*   Misc. Labs: Bedside LE Dopplers: performed by self - no flow detected in LLE  Micro Results: Recent Results (from the past 240 hour(s))  MRSA PCR Screening     Status: None   Collection Time: 10/11/14  5:29 PM  Result Value Ref Range Status   MRSA by PCR NEGATIVE NEGATIVE Final    Comment:        The GeneXpert MRSA Assay (FDA approved for NASAL specimens only), is one component of a comprehensive MRSA colonization surveillance program. It is not intended to diagnose MRSA infection nor to guide or monitor treatment for MRSA infections.    Studies/Results: Ct Head Wo Contrast  10/11/2014   CLINICAL DATA:  Code stroke. RIGHT-sided facial droop with slurred speech.  EXAM: CT HEAD WITHOUT CONTRAST  TECHNIQUE: Contiguous axial images were obtained from the base of the skull through the vertex without intravenous contrast.  COMPARISON:  CT head 10/12/2012.  FINDINGS: No evidence for acute infarction, hemorrhage, mass lesion, hydrocephalus, or extra-axial fluid. Normal for age cerebral volume. No white matter disease. Calvarium intact. No sinus or mastoid fluid.  IMPRESSION: Negative exam.  No change from priors.  Critical Value/emergent results were called by telephone at the time of interpretation on 10/11/2014 at 1:12 pm to Dr. Janann Colonel , who verbally acknowledged these results.   Electronically Signed   By: Rolla Flatten M.D.   On: 10/11/2014 13:14   Dg Chest Port 1 View  10/11/2014   CLINICAL DATA:  Shortness of breath.  EXAM: PORTABLE CHEST - 1 VIEW  COMPARISON:  10/11/2014 and 01/17/2014  FINDINGS: Right-sided pacemaker unchanged. Lungs are adequately inflated with stable chronic elevation of the left hemidiaphragm and stable chronic changes over the left lung. Known calcified pleural plaques. No focal lobar consolidation or effusion cardiomediastinal silhouette and remainder of the exam is unchanged.  IMPRESSION: No active disease.  Chronic stable  changes of the left lung. Evidence of asbestos related pleural disease.   Electronically Signed   By: Marin Olp M.D.   On: 10/11/2014 21:56   Dg Chest Portable 1 View  10/11/2014   CLINICAL DATA:  Right facial droop  EXAM: PORTABLE CHEST - 1 VIEW  COMPARISON:  01/17/2014  FINDINGS: Cardiac shadow is again mildly enlarged. A pacing device is again seen. Chronic left pleural effusion and left basilar changes are noted although they have increased in the interval from the prior  exam. The right lung remains clear. The visualized upper abdomen is unremarkable.  IMPRESSION: Increasing chronic changes in the left lung base when compare with the prior exam. Calcified pleural plaques are again noted.   Electronically Signed   By: Inez Catalina M.D.   On: 10/11/2014 14:06   Medications: I have reviewed the patient's current medications. Scheduled Meds: . [MAR Hold] aspirin EC  81 mg Oral Daily  . [MAR Hold] buPROPion  75 mg Oral BID  . magnesium sulfate 1 - 4 g bolus IVPB  1 g Intravenous Once  . [MAR Hold] metoprolol  25 mg Oral BID  . [MAR Hold] metroNIDAZOLE  2,000 mg Oral Once  . [MAR Hold] pantoprazole  40 mg Oral Daily  . [MAR Hold] tamsulosin  0.4 mg Oral Daily  . [MAR Hold] tiotropium  18 mcg Inhalation Daily   Continuous Infusions: . [MAR Hold] diltiazem (CARDIZEM) infusion 5 mg/hr (10/12/14 6761)  . heparin 1,050 Units/hr (10/12/14 0606)  . lactated ringers 50 mL/hr at 10/12/14 1028   PRN Meds:.[MAR Hold] ipratropium-albuterol, [MAR Hold] metoprolol, [MAR Hold] oxyCODONE-acetaminophen, [MAR Hold] senna-docusate Assessment/Plan: Principal Problem:   STEMI (ST elevation myocardial infarction) Active Problems:   Essential hypertension   Chronic kidney disease   Chronic combined systolic and diastolic congestive heart failure   Weight loss   Atrial fibrillation, chronic   Atrial fibrillation with RVR   TIA (transient ischemic attack) Mr. Vicario is a 69 y.o. M PMH A fib with RVR, COPD,  CKD, HTN, poor compliance on coumadin p/w acute slurred speech, R dysarthria/numbness, trop 25.83 in ED with new ST elevation.  STEMI: Chronic A fib with RVR, poor coumadin compliance, 81 pack-year smoking hx. No CP. PoC Troponin 25.83. ECG with STEMI. EF 30-35% with diffuse hypokinesis, previously 45-50% on 12/16/2013. No thrombus noted but TTE limited 2/2 A fib with RVR. Multiple acute arterial thrombi in pt with significant risk factors most likely thromboembolic. - Cardiology consulted, appreciate recs. Will comment today on utility of TEE - troponin downtrending: 25.83 > 20.70 > 17.01 > 16.22 > 13.21 - home Percocet 5-325 q4hr prn for pain - O2 via [place] for SpO2 > 94% - s/p ASA 325 in ED. Start ASA 81 daily - metoprolol 25 mg BID - metoprolol 2.5 mg q5hr PRN - Per cards, heparin gtt, diltiazem gtt  Stroke/TIA: Slurred speech, R facial droop, dysarthria. Likely thromboembolic. A fib with RVR, previous known RA thrombus, poor coumadin compliance, 81 pack-year smoking hx, HTN. Possible decreased RUE/RLE sensation, mild possible R dysmetria with rapid alternating movements - otherwise all FND resolved. - Neuro consulted, appreciate recs - Pt has passed bedside swallow - OK for PO - TTE with bubble: EF 30-35% with diffuse hypokinesis, mild AR, mild RA dilation, no emboli. Limited 2/2 A fib with RVR - Hgb A1c - LDL 50 - no statin - carotid dopplers - Neuro checks - PT/OT/SLP  RLE Arterial Thrombus: Acute onset of pain, pallor, pulselessness in LLE inferior to knee. Cold to touch. H/o arterial thrombus in LLE with similar presentation in 11/2013, s/p fasciotomy/thrombectomy. - Dr. Donnetta Hutching of interventional radiology has seen the pt, took him back to OR for thrombectomy  Multiple Arterial Emboli: Simultaneous STEMI, stroke vs TIA, followed by LLE arterial thrombus. Recent RLE arterial thrombus in 11/2013. Multiple risk factors: 69 y.o. male, chronic A fib with RVR, poor coumadin compliance, 81  pack-year smoking hx. - Stroke and ACS mgmt as above - Per Dr. Beryle Beams, no role for hypercoagulability panel  in arterial-only thrombi in elderly pt with multiple risk factors - Given poor compliance with coumadin, consider starting NOAC on d/c vs coumadin. Estimated CrCl Cockroft-Gault is 44.79 mL/min. - Malignancy evaluation as below  AKI on CKD: Serum Cr up to 1.68 on admission from baseline 1.33. Possible cardiogenic prerenal state 2/2 acute STEMI in addition to likely dehydration from poor PO. Renal thrombus also possible. Unclear CKD etiology, negative w/u in 11/2009. - SCr trending down, 1.50 this AM - Strict I/O, daily weights - UCr, UNa for FENa   Trichomoniasis: Found incidentally on U/A - 2 mg metronidazole PO x1  HTN: Previously on metoprolol 50 mg daily, diltiazem 240 mg daily. - metoprolol, dilt as above for ACS mgmt  Transaminitis: AST 72, ALT 15 on admission. May be 2/2 to temporary hypotension in the setting of ACS. - LFTs declining today: AST 57, ALT 17  COPD: baseline SOB, 81 pack-year smoking hx. PFTs 2012: FEV1/FVC 71%, FEV1 78% predicted. - Spiriva daily  Depression: Continue bupropion 75 mg BID  Weight Loss c/f Malignancy: Pt reports about 3 months of decreased appetite with poor PO intake. Potentially due to a combination of COPD, depression, chronic pain with opioid use. Several months of decreased appetite followed by multiple thrombi raises c/f malignancy, possible Trousseau's. Previous arterial thrombus admission on 12/10/2013. Normal colonoscopy on 10/27/2010. PSA 07/28/2014 stable at 0.49. Last CT abd on 10/12/2012, last CT abd with contrast on 12/22/2008. - Encourage PO after SLP swallow eval - CT abd/pelvis with contrast (or non-con pending renal function) to assess for abdominal malignancy when pt stable  F: LR @ 50 intra-op, then  E: BMP WNL, replenish PRN. N: NPO for procedure, then regular diet GI: Protonix 40 mg daily  PPX: Pt on heparin gtt for  ACS.  Dispo: Disposition is deferred at this time, awaiting improvement of current medical problems.  Anticipated discharge in approximately 2-3 day(s).   The patient does have a current PCP Bertha Stakes, MD) and does need an [facility] hospital follow-up appointment after discharge.  The patient does not have transportation limitations that hinder transportation to clinic appointments.  .Services Needed at time of discharge: Y = Yes, Blank = No PT:   OT:   RN:   Equipment:   Other:     LOS: 1 day   Susa Day, Med Student 10/12/2014, 10:30 AM

## 2014-10-12 NOTE — Progress Notes (Signed)
PT Cancellation Note  Patient Details Name: [name] MRN: [id number] DOB: 23-Oct-1945   Cancelled Treatment:    Reason Eval/Treat Not Completed: Patient at procedure or test/unavailable. Pt currently off unit for left femoral embolectomy and intraoperative arteriogram. Will check back and complete PT eval when able.    Rolinda Roan 10/12/2014, 2:12 PM  Rolinda Roan, PT, DPT Acute Rehabilitation Services Pager: [phone]

## 2014-10-13 ENCOUNTER — Inpatient Hospital Stay (HOSPITAL_COMMUNITY): Payer: Commercial Managed Care - HMO

## 2014-10-13 ENCOUNTER — Encounter (HOSPITAL_COMMUNITY): Payer: Self-pay | Admitting: Radiology

## 2014-10-13 DIAGNOSIS — Z9889 Other specified postprocedural states: Secondary | ICD-10-CM

## 2014-10-13 DIAGNOSIS — I743 Embolism and thrombosis of arteries of the lower extremities: Secondary | ICD-10-CM

## 2014-10-13 DIAGNOSIS — G459 Transient cerebral ischemic attack, unspecified: Secondary | ICD-10-CM

## 2014-10-13 DIAGNOSIS — R0602 Shortness of breath: Secondary | ICD-10-CM

## 2014-10-13 LAB — BASIC METABOLIC PANEL
ANION GAP: 6 (ref 5–15)
BUN: 13 mg/dL (ref 6–20)
CALCIUM: 8.4 mg/dL — AB (ref 8.9–10.3)
CO2: 24 mmol/L (ref 22–32)
CREATININE: 1.31 mg/dL — AB (ref 0.61–1.24)
Chloride: 108 mmol/L (ref 101–111)
GFR calc non Af Amer: 54 mL/min — ABNORMAL LOW (ref >60)
GLUCOSE: 86 mg/dL (ref 65–99)
Potassium: 4.3 mmol/L (ref 3.5–5.1)
Sodium: 138 mmol/L (ref 135–145)

## 2014-10-13 LAB — CBC
HCT: 37.2 % — ABNORMAL LOW (ref 39.0–52.0)
Hemoglobin: 12.5 g/dL — ABNORMAL LOW (ref 13.0–17.0)
MCH: 30.6 pg (ref 26.0–34.0)
MCHC: 33.6 g/dL (ref 30.0–36.0)
MCV: 91.2 fL (ref 78.0–100.0)
Platelets: 122 10*3/uL — ABNORMAL LOW (ref 150–400)
RBC: 4.08 MIL/uL — ABNORMAL LOW (ref 4.22–5.81)
RDW: 14.5 % (ref 11.5–15.5)
WBC: 9.9 10*3/uL (ref 4.0–10.5)

## 2014-10-13 LAB — MAGNESIUM: Magnesium: 1.9 mg/dL (ref 1.7–2.4)

## 2014-10-13 LAB — HEPARIN LEVEL (UNFRACTIONATED)
Heparin Unfractionated: 0.34 IU/mL (ref 0.30–0.70)
Heparin Unfractionated: 0.24 IU/mL — ABNORMAL LOW (ref 0.30–0.70)

## 2014-10-13 MED ORDER — ROSUVASTATIN CALCIUM 10 MG PO TABS
20.0000 mg | ORAL_TABLET | Freq: Every day | ORAL | Status: DC
  Administered 2014-10-13 – 2014-10-18 (×6): 20 mg via ORAL
  Filled 2014-10-13 (×6): qty 2

## 2014-10-13 MED ORDER — WARFARIN - PHARMACIST DOSING INPATIENT: Freq: Every day | Status: DC

## 2014-10-13 MED ORDER — METOPROLOL TARTRATE 50 MG PO TABS
50.0000 mg | ORAL_TABLET | Freq: Two times a day (BID) | ORAL | Status: DC
  Administered 2014-10-13: 50 mg via ORAL
  Filled 2014-10-13 (×2): qty 1

## 2014-10-13 MED ORDER — IOHEXOL 300 MG/ML  SOLN
100.0000 mL | Freq: Once | INTRAMUSCULAR | Status: AC | PRN
  Administered 2014-10-13: 80 mL via INTRAVENOUS

## 2014-10-13 MED ORDER — ATORVASTATIN CALCIUM 40 MG PO TABS: 40.0000 mg | ORAL_TABLET | Freq: Every day | ORAL | Status: DC

## 2014-10-13 MED ORDER — ENSURE ENLIVE PO LIQD
237.0000 mL | Freq: Three times a day (TID) | ORAL | Status: DC
  Administered 2014-10-14 – 2014-10-18 (×11): 237 mL via ORAL

## 2014-10-13 MED ORDER — WARFARIN SODIUM 7.5 MG PO TABS
7.5000 mg | ORAL_TABLET | Freq: Once | ORAL | Status: AC
Start: 2014-10-13 — End: 2014-10-13
  Administered 2014-10-13: 7.5 mg via ORAL
  Filled 2014-10-13: qty 1

## 2014-10-13 MED ORDER — DILTIAZEM HCL 30 MG PO TABS
90.0000 mg | ORAL_TABLET | Freq: Four times a day (QID) | ORAL | Status: DC
  Administered 2014-10-13 – 2014-10-14 (×3): 90 mg via ORAL
  Filled 2014-10-13 (×8): qty 1

## 2014-10-13 NOTE — Progress Notes (Signed)
PT Cancellation Note  Patient Details Name: [name] MRN: [id number] DOB: September 10, 1945   Cancelled Treatment:    Reason Eval/Treat Not Completed: Medical issues which prohibited therapy (on Cardiazem for incresed HR, s/p femoral embolectomy 5/25. Please  Clarify/order  activity for  PT to  evaluate.)   Claretha Cooper 10/13/2014, 7:43 AM Tresa Endo PT [phone]

## 2014-10-13 NOTE — Progress Notes (Signed)
VASCULAR LAB PRELIMINARY  PRELIMINARY  PRELIMINARY  PRELIMINARY  Carotid Dopplers completed.    Preliminary report:  1-39% ICA stenosis.  Vertebral artery flow is antegrade.   Tylyn Derwin, RVT 10/13/2014, 2:34 PM

## 2014-10-13 NOTE — Progress Notes (Signed)
PHARMACY NOTE  Pharmacy Consult :  69 y.o. male is currently on a Heparin infusion for Chest Pain/ACS.   Heparin Dosing Wt :  70 kg  LABS :  Recent Labs  10/11/14 1205 10/11/14 1310 10/11/14 2136 10/12/14 0426 10/12/14 1720 10/13/14 0607 10/13/14 2017  HGB 14.6 16.7  --  13.4  --  12.5*  --   HCT 43.6 49.0  --  40.1  --  37.2*  --   PLT 131*  --   --  121*  --  122*  --   APTT 28  --   --   --   --   --   --   LABPROT 15.1  --   --   --   --   --   --   INR 1.18  --   --   --   --   --   --   HEPARINUNFRC  --   --  0.40 0.11* 1.42* 0.34 0.24*  CREATININE 1.68* 1.60*  --  1.50*  --  1.31*  --     MEDICATION: Infusion[s]: Infusions:  . heparin 1,000 Units/hr (10/13/14 1022)    ASSESSMENT :  69 y.o. male is currently on a Heparin infusion for ACS/Chest Pain.   Heparin infusing at 1000 units/hr.    Heparin level has drifted below the therapeutic goal to 0.24 units/ml.  No evidence of bleeding complications observed.  GOAL : Heparin Level  0.3 - 0.7 units/m  PLAN : 1. Increase Heparin infusion to 1200 units/hr. 2. Will check Heparin level with AM Labs  3. Continue Daily Heparin level, CBC while on Heparin.  Monitor for bleeding complications. Follow Platelet counts.  Marthenia Rolling,  Pharm.D   10/13/2014,  9:50 PM

## 2014-10-13 NOTE — Progress Notes (Signed)
  Date: 10/13/2014  Patient name: [name]  Medical record number: [id number]  Date of birth: January 11, 1946   This patient's plan of care was discussed with the house staff. Please see Dr. Ronnald Ramp' note for complete details. I concur with his findings.  Mr. Fulco is improving.  Compliance with coumadin will be paramount going forward.  Had a CT of CAP today without any acute malignancy.  Likely culprit for clotting would be afib and noncomplicance with anticoagulation.    Sid Falcon, MD 10/13/2014, 9:05 PM

## 2014-10-13 NOTE — Progress Notes (Signed)
Inpatient Progress Note - Internal Medicine  Subjective: Today the patient reports his only pain is his chronic bilateral hip pain; this is adequately controlled with his home Percocet. He has some mild discomfort at the site of his L femoral embolectomy. He denies pain, numbness, paresthesias or weakness in his LLE. He reports occasional palpitations and SOB that coincide with needing pain meds for his hip pain. He reports being very hungry this morning and is upset about being NPO.  Objective: Vital signs in last 24 hours: Filed Vitals:   10/13/14 0130 10/13/14 0311 10/13/14 0430 10/13/14 0616  BP: 98/73  109/76 119/79  Pulse: 152  102 125  Temp:  99.3 F (37.4 C) 98.6 F (37 C)   TempSrc:  Oral    Resp: 11  23 19   Weight:   69.491 kg (153 lb 3.2 oz)   SpO2: 97%   97%   Weight change: 0.391 kg (13.8 oz)  Intake/Output Summary (Last 24 hours) at 10/13/14 2423 Last data filed at 10/13/14 0400  Gross per 24 hour  Intake    900 ml  Output    800 ml  Net    100 ml   General: Elderly cachectic male lying in bed. Alert and conversant. HEENT:  EOMI, sclerae/conjunctiva clear, mmm no oropharyngeal erythema, adentulate Cardiac: Tachycardic, irregularly irregular, no m/r/g Pulmonary:  Diminished breath sounds, no increased WOB Abdominal: soft, nt/nd, no hepatosplenomegaly, normoactive bowel sounds Extremities: No cyanosis or clubbing. No pedal edema. 2+ L dorsal pedal pulse, 1+ R dorsal pedal pulse. Neurologic:  Alert, grossly oriented to conversation. Speech content and affect appropriate.   EOMI, PERRL. Sensation full and symmetric in all 3 trigeminal distributions. CN II-XII intact.   5/5 strength bilaterally in BUE/BLE   Sensation full and symmetric in lower legs, lower arms.    No ataxia/dysmetria on finger-to-nose or rapid alternating movements  Lab Results: Basic Metabolic Panel:  Recent Labs Lab 10/12/14 0426 10/13/14 0341 10/13/14 0607  NA 141  --  138  K 4.5  --  4.3   CL 107  --  108  CO2 24  --  24  GLUCOSE 85  --  86  BUN 15  --  13  CREATININE 1.50*  --  1.31*  CALCIUM 8.7*  --  8.4*  MG 1.8 1.9  --    CBC:  Recent Labs Lab 10/11/14 1205  10/12/14 0426 10/13/14 0607  WBC 7.8  --  5.9 9.9  NEUTROABS 5.4  --   --   --   HGB 14.6  < > 13.4 12.5*  HCT 43.6  < > 40.1 37.2*  MCV 90.8  --  91.3 91.2  PLT 131*  --  121* 122*  < > = values in this interval not displayed.  Micro Results: Recent Results (from the past 240 hour(s))  MRSA PCR Screening     Status: None   Collection Time: 10/11/14  5:29 PM  Result Value Ref Range Status   MRSA by PCR NEGATIVE NEGATIVE Final    Comment:        The GeneXpert MRSA Assay (FDA approved for NASAL specimens only), is one component of a comprehensive MRSA colonization surveillance program. It is not intended to diagnose MRSA infection nor to guide or monitor treatment for MRSA infections.   Surgical pcr screen     Status: Abnormal   Collection Time: 10/12/14  8:51 AM  Result Value Ref Range Status   MRSA, PCR NEGATIVE NEGATIVE Final  Staphylococcus aureus POSITIVE (A) NEGATIVE Final    Comment:        The Xpert SA Assay (FDA approved for NASAL specimens in patients over 67 years of age), is one component of a comprehensive surveillance program.  Test performance has been validated by [facility] for patients greater than or equal to 50 year old. It is not intended to diagnose infection nor to guide or monitor treatment.    Studies/Results: Ct Head Wo Contrast  10/11/2014   CLINICAL DATA:  Code stroke. RIGHT-sided facial droop with slurred speech.  EXAM: CT HEAD WITHOUT CONTRAST  TECHNIQUE: Contiguous axial images were obtained from the base of the skull through the vertex without intravenous contrast.  COMPARISON:  CT head 10/12/2012.  FINDINGS: No evidence for acute infarction, hemorrhage, mass lesion, hydrocephalus, or extra-axial fluid. Normal for age cerebral volume. No white  matter disease. Calvarium intact. No sinus or mastoid fluid.  IMPRESSION: Negative exam.  No change from priors.  Critical Value/emergent results were called by telephone at the time of interpretation on 10/11/2014 at 1:12 pm to Dr. Janann Colonel , who verbally acknowledged these results.   Electronically Signed   By: Rolla Flatten M.D.   On: 10/11/2014 13:14   Dg Chest Port 1 View  10/11/2014   CLINICAL DATA:  Shortness of breath.  EXAM: PORTABLE CHEST - 1 VIEW  COMPARISON:  10/11/2014 and 01/17/2014  FINDINGS: Right-sided pacemaker unchanged. Lungs are adequately inflated with stable chronic elevation of the left hemidiaphragm and stable chronic changes over the left lung. Known calcified pleural plaques. No focal lobar consolidation or effusion cardiomediastinal silhouette and remainder of the exam is unchanged.  IMPRESSION: No active disease.  Chronic stable changes of the left lung. Evidence of asbestos related pleural disease.   Electronically Signed   By: Marin Olp M.D.   On: 10/11/2014 21:56   Dg Chest Portable 1 View  10/11/2014   CLINICAL DATA:  Right facial droop  EXAM: PORTABLE CHEST - 1 VIEW  COMPARISON:  01/17/2014  FINDINGS: Cardiac shadow is again mildly enlarged. A pacing device is again seen. Chronic left pleural effusion and left basilar changes are noted although they have increased in the interval from the prior exam. The right lung remains clear. The visualized upper abdomen is unremarkable.  IMPRESSION: Increasing chronic changes in the left lung base when compare with the prior exam. Calcified pleural plaques are again noted.   Electronically Signed   By: Inez Catalina M.D.   On: 10/11/2014 14:06   Dg Ang/ext/uni/or Left  10/12/2014   CLINICAL DATA:  Femoral thrombectomy for embolic occlusion in the left lower extremity.  EXAM: LEFT ANG/EXT/UNI/ OR  COMPARISON:  None.  FINDINGS: Intraoperative image shows a normally patent popliteal artery and normally patent visualized anterior tibial,  peroneal and posterior tibial arteries to nearly the level of the ankle. No filling defects are identified.  IMPRESSION: No distal occlusions or filling defects identified at the level of the popliteal and tibial arteries.   Electronically Signed   By: Aletta Edouard M.D.   On: 10/12/2014 14:02   Medications: I have reviewed the patient's current medications. Scheduled Meds: . aspirin EC  81 mg Oral Daily  . buPROPion  75 mg Oral BID  . metoprolol  25 mg Oral BID  . pantoprazole  40 mg Oral Daily  . tamsulosin  0.4 mg Oral Daily  . tiotropium  18 mcg Inhalation Daily   Continuous Infusions: . sodium chloride 100 mL/hr at  10/12/14 1443  . diltiazem (CARDIZEM) infusion 10 mg/hr (10/13/14 0649)  . heparin 950 Units/hr (10/12/14 1926)   PRN Meds:.ipratropium-albuterol, metoprolol, oxyCODONE-acetaminophen, senna-docusate Assessment/Plan: Principal Problem:   STEMI (ST elevation myocardial infarction) Active Problems:   Essential hypertension   Chronic kidney disease   Chronic combined systolic and diastolic congestive heart failure   Weight loss   Atrial fibrillation, chronic   Atrial fibrillation with RVR   TIA (transient ischemic attack) [name] is a 69 y.o. M PMH A fib with RVR, COPD, CKD, HTN, poor compliance on coumadin p/w acute slurred speech, R dysarthria/numbness, trop 25.83 in ED with new ST elevation.  STEMI: Chronic A fib with RVR, poor coumadin compliance, 81 pack-year smoking hx. No CP. PoC Troponin 25.83. ECG with STEMI. EF 30-35% with diffuse hypokinesis, previously 45-50% on 12/16/2013. No thrombus noted but TTE limited 2/2 A fib with RVR. Multiple acute arterial thrombi in pt with significant risk factors most likely thromboembolic. - Cardiology consulted, appreciate recs. NO indication for TEE. - troponin downtrending: 25.83 > 20.70 > 17.01 > 16.22 > 13.21 - home Percocet 5-325 q4hr prn for pain - O2 via [place] for SpO2 > 94% - ASA 81 daily - metoprolol 50 mg BID per  cards, 2.5 mg q5hr PRN - rosuvastatin 20 mg as atorvastatin may interact with diltiazem - Hold any ACEI for now given low BPs - Heparin gtt for ACS - Transition diltiazem from drip to PO: 80 mg four times daily (equivalent to 11 mg/hr; pt is currently on 10 mg/hr) - Pending CT results, begin warfarin bridge  Stroke/TIA: Slurred speech, R facial droop, dysarthria. Likely thromboembolic. A fib with RVR, previous known RA thrombus, poor coumadin compliance, 81 pack-year smoking hx, HTN. All FND resolved. - Neuro consulted, appreciate recs - Pt has passed bedside swallow - OK for PO - TTE with bubble: EF 30-35% with diffuse hypokinesis, mild AR, mild RA dilation, no emboli. Limited 2/2 A fib with RVR - Hgb A1c 5.6, LDL 50 - carotid dopplers - Neuro checks - PT/OT/SLP  LLE Arterial Thrombus: Acute onset of pain, pallor, pulselessness in LLE inferior to knee. Cold to touch. H/o arterial thrombus in RLE with similar presentation in 11/2013, s/p fasciotomy/thrombectomy. - POD # 1 s/p L femoral thrombectomy. No residual sx.  Multiple Arterial Emboli: Simultaneous STEMI, stroke vs TIA, followed by LLE arterial thrombus. Recent RLE arterial thrombus in 11/2013. Multiple risk factors: 69 y.o. male, chronic A fib with RVR, poor coumadin compliance, 81 pack-year smoking hx. - Stroke and ACS mgmt as above - Per Dr. Beryle Beams, no role for hypercoagulability panel in arterial-only thrombi in elderly pt with multiple risk factors - Begin bridge to coumadin as above - Malignancy evaluation as below  Weight Loss c/f Malignancy: Pt reports about 3 months of decreased appetite with poor PO intake. Potentially due to a combination of COPD, depression, chronic pain with opioid use. Several months of decreased appetite followed by multiple thrombi raises c/f malignancy, possible Trousseau's. Previous arterial thrombus admission on 12/10/2013. Normal colonoscopy on 10/27/2010. PSA 07/28/2014 stable at 0.49. Last CT abd  on 10/12/2012, last CT abd with contrast on 12/22/2008. Last contrast CT chest is CTA in 2010. Known pleural plaques possibly 2/2 minor asbestos exposure. - Encourage PO today after CT. - CT chest/abd/pelvis with contrast today. Reduced contrast load given renal function.  AKI on CKD: Serum Cr up to 1.68 on admission from baseline 1.33. Possible cardiogenic prerenal state 2/2 acute STEMI in addition to likely dehydration  from poor PO. Renal thrombus also possible. Unclear CKD etiology, negative w/u in 11/2009. Resolved - serum Cr 1.31 with IVF. - Strict I/O, daily weights  Trichomoniasis: Found incidentally on U/A - 2 mg metronidazole PO x1  HTN: Previously on metoprolol 50 mg daily, diltiazem 240 mg daily. - metoprolol, dilt as above for ACS mgmt  Transaminitis: AST 72, ALT 15 on admission. May be 2/2 to temporary hypotension in the setting of ACS. - LFTs declining 5/25: AST 57, ALT 17  COPD: baseline SOB, 81 pack-year smoking hx. PFTs 2012: FEV1/FVC 71%, FEV1 78% predicted. - Spiriva daily  Depression: Continue bupropion 75 mg BID  F: d/c IVF today, encourage PO  E: BMP WNL, replenish PRN. N: NPO for CT, then regular diet GI: Protonix 40 mg daily  PPX: Pt on heparin gtt for ACS.  Dispo: Disposition is deferred at this time, awaiting improvement of current medical problems.  Anticipated discharge in approximately 2-3 day(s).   The patient does have a current PCP Bertha Stakes, MD) and does need an [facility] hospital follow-up appointment after discharge.  The patient does not have transportation limitations that hinder transportation to clinic appointments.  .Services Needed at time of discharge: Y = Yes, Blank = No PT:   OT:   RN:   Equipment:   Other:     LOS: 2 days   Susa Day, Med Student 10/13/2014, 8:37 AM

## 2014-10-13 NOTE — Progress Notes (Signed)
Subjective: Doing better today, had some chest pain earlier this AM, says it resolved on its own. Says his left leg feels much better s/p embolectomy.   Objective: Vital signs in last 24 hours: Filed Vitals:   10/13/14 0430 10/13/14 0616 10/13/14 1157 10/13/14 1206  BP: 109/76 119/79    Pulse: 102 125    Temp: 98.6 F (37 C)   99 F (37.2 C)  TempSrc:    Oral  Resp: 23 19    Height:   6\' 3"  (1.905 m)   Weight: 153 lb 3.2 oz (69.491 kg)     SpO2:  97%     Weight change: 13.8 oz (0.391 kg)  Intake/Output Summary (Last 24 hours) at 10/13/14 1447 Last data filed at 10/13/14 1200  Gross per 24 hour  Intake      0 ml  Output   1400 ml  Net  -1400 ml   Physical Exam: General: AA male, alert, cooperative, NAD. Thin appearing.  HEENT: PERRL, EOMI. Moist mucus membranes Neck: Full range of motion without pain, supple, no lymphadenopathy or carotid bruits Lungs: Clear to ascultation bilaterally, normal work of respiration, no wheezes, rales, rhonchi Heart: Mild tachycardia, irregular, no murmurs, gallops, or rubs Abdomen: Soft, non-tender, non-distended, BS + Extremities: No cyanosis, clubbing, or edema. Left femoral dressing in place. Venous stasis changes in LE's bilaterally.  Neurologic: Alert & oriented x3, cranial nerves II-XII intact, strength grossly intact, sensation intact to light touch   Lab Results: Basic Metabolic Panel:  Recent Labs Lab 10/12/14 0426 10/13/14 0341 10/13/14 0607  NA 141  --  138  K 4.5  --  4.3  CL 107  --  108  CO2 24  --  24  GLUCOSE 85  --  86  BUN 15  --  13  CREATININE 1.50*  --  1.31*  CALCIUM 8.7*  --  8.4*  MG 1.8 1.9  --    Liver Function Tests:  Recent Labs Lab 10/11/14 1205 10/12/14 0426  AST 72* 57*  ALT 15* 17  ALKPHOS 76 66  BILITOT 1.2 1.2  PROT 7.2 5.8*  ALBUMIN 3.7 3.0*   CBC:  Recent Labs Lab 10/11/14 1205  10/12/14 0426 10/13/14 0607  WBC 7.8  --  5.9 9.9  NEUTROABS 5.4  --   --   --   HGB 14.6  <  > 13.4 12.5*  HCT 43.6  < > 40.1 37.2*  MCV 90.8  --  91.3 91.2  PLT 131*  --  121* 122*  < > = values in this interval not displayed. Cardiac Enzymes:  Recent Labs Lab 10/11/14 1903 10/11/14 2136 10/12/14 0426  TROPONINI 17.01* 16.22* 13.21*   CBG:  Recent Labs Lab 10/12/14 1314  GLUCAP 138*   Hemoglobin A1C:  Recent Labs Lab 10/11/14 1526  HGBA1C 5.6   Fasting Lipid Panel:  Recent Labs Lab 10/12/14 0426  CHOL 92  HDL 36*  LDLCALC 50  TRIG 31  CHOLHDL 2.6   Thyroid Function Tests:  Recent Labs Lab 10/11/14 2136  TSH 2.031   Coagulation:  Recent Labs Lab 10/11/14 1205  LABPROT 15.1  INR 1.18   Urine Drug Screen: Drugs of Abuse     Component Value Date/Time   LABOPIA NONE DETECTED 10/11/2014 1724   LABOPIA NEGATIVE 12/27/2008 1943   COCAINSCRNUR NONE DETECTED 10/11/2014 1724   COCAINSCRNUR NEG 07/06/2009 2225   LABBENZ NONE DETECTED 10/11/2014 1724   LABBENZ NEG 07/06/2009 2225  AMPHETMU NONE DETECTED 10/11/2014 1724   AMPHETMU NEG 07/06/2009 2225   THCU POSITIVE* 10/11/2014 1724   LABBARB NONE DETECTED 10/11/2014 1724    Alcohol Level:  Recent Labs Lab 10/11/14 1305  ETH <5   Urinalysis:  Recent Labs Lab 10/11/14 1724  COLORURINE YELLOW  LABSPEC 1.010  PHURINE 7.0  GLUCOSEU NEGATIVE  HGBUR MODERATE*  BILIRUBINUR NEGATIVE  KETONESUR NEGATIVE  PROTEINUR NEGATIVE  UROBILINOGEN 1.0  NITRITE NEGATIVE  LEUKOCYTESUR MODERATE*    Micro Results: Recent Results (from the past 240 hour(s))  MRSA PCR Screening     Status: None   Collection Time: 10/11/14  5:29 PM  Result Value Ref Range Status   MRSA by PCR NEGATIVE NEGATIVE Final    Comment:        The GeneXpert MRSA Assay (FDA approved for NASAL specimens only), is one component of a comprehensive MRSA colonization surveillance program. It is not intended to diagnose MRSA infection nor to guide or monitor treatment for MRSA infections.   Surgical pcr screen      Status: Abnormal   Collection Time: 10/12/14  8:51 AM  Result Value Ref Range Status   MRSA, PCR NEGATIVE NEGATIVE Final   Staphylococcus aureus POSITIVE (A) NEGATIVE Final    Comment:        The Xpert SA Assay (FDA approved for NASAL specimens in patients over 18 years of age), is one component of a comprehensive surveillance program.  Test performance has been validated by [facility] for patients greater than or equal to 34 year old. It is not intended to diagnose infection nor to guide or monitor treatment.    Studies/Results: Ct Chest W Contrast  10/13/2014   CLINICAL DATA:  69 year old male with unexplained weight loss. Patient with myocardial infarction, CVA, multiple arch O emboli. Acute on chronic kidney disease.  EXAM: CT CHEST, ABDOMEN, AND PELVIS WITH CONTRAST  TECHNIQUE: Multidetector CT imaging of the chest, abdomen and pelvis was performed following the standard protocol during bolus administration of intravenous contrast.  CONTRAST:  62mL OMNIPAQUE IOHEXOL 300 MG/ML  SOLN  COMPARISON:  06/02/2013 and prior chest CTs  FINDINGS: CT CHEST FINDINGS  Mediastinum/Nodes: Cardiomegaly again noted. Moderate coronary artery calcifications are identified. The ascending aorta measures 3.7 cm and greatest diameter. No enlarged lymph nodes are identified. There is no evidence of pericardial effusion.  Lungs/Pleura: Dependent and bibasilar atelectasis/ scarring again noted. Scarring within the mid and lower left lung again identified. Patchy ground-glass opacities within the left upper lobe may represent aspiration or for pneumonia. There is no evidence of suspicious mass or nodule. COPD/emphysema again identified. Left posterior pleural calcifications are again identified.  Musculoskeletal: No acute or suspicious abnormalities.  CT ABDOMEN AND PELVIS FINDINGS  Hepatobiliary: The liver and gallbladder are unremarkable. There is no evidence of biliary dilatation.  Pancreas: Unremarkable  Spleen:  Unremarkable  Adrenals/Urinary Tract: Moderate bilateral renal atrophy and cortical thinning noted, right greater than left. There is no evidence of hydronephrosis, renal mass or definite renal calculi.  Stomach/Bowel: There is no evidence of bowel obstruction. No definite bowel wall thickening noted. No dilated bowel loops are present. Hip replacement artifact obscures detail within the pelvis.  Vascular/Lymphatic: No enlarged lymph nodes or abdominal aortic aneurysm identified. Calcifications within the IVC, iliac and femoral veins noted.  Reproductive: Prostate not well visualized.  Other: No free fluid, abscess or pneumoperitoneum.  Musculoskeletal: Mild stranding in the left inguinal region is likely postoperative. Bilateral hip replacements identified. No acute bony abnormalities  are identified. Degenerative changes in the lumbar spine and bilateral L5 pars defects again noted.  IMPRESSION: No evidence of primary malignancy.  Patchy left upper lobe ground-glass opacities - possible aspiration versus pneumonia.  Cardiomegaly, coronary artery disease, ectatic ascending thoracic aorta, COPD/emphysema and moderate bilateral renal atrophy.   Electronically Signed   By: Margarette Canada M.D.   On: 10/13/2014 14:10   Ct Abdomen Pelvis W Contrast  10/13/2014   CLINICAL DATA:  69 year old male with unexplained weight loss. Patient with myocardial infarction, CVA, multiple arch O emboli. Acute on chronic kidney disease.  EXAM: CT CHEST, ABDOMEN, AND PELVIS WITH CONTRAST  TECHNIQUE: Multidetector CT imaging of the chest, abdomen and pelvis was performed following the standard protocol during bolus administration of intravenous contrast.  CONTRAST:  96mL OMNIPAQUE IOHEXOL 300 MG/ML  SOLN  COMPARISON:  06/02/2013 and prior chest CTs  FINDINGS: CT CHEST FINDINGS  Mediastinum/Nodes: Cardiomegaly again noted. Moderate coronary artery calcifications are identified. The ascending aorta measures 3.7 cm and greatest diameter. No  enlarged lymph nodes are identified. There is no evidence of pericardial effusion.  Lungs/Pleura: Dependent and bibasilar atelectasis/ scarring again noted. Scarring within the mid and lower left lung again identified. Patchy ground-glass opacities within the left upper lobe may represent aspiration or for pneumonia. There is no evidence of suspicious mass or nodule. COPD/emphysema again identified. Left posterior pleural calcifications are again identified.  Musculoskeletal: No acute or suspicious abnormalities.  CT ABDOMEN AND PELVIS FINDINGS  Hepatobiliary: The liver and gallbladder are unremarkable. There is no evidence of biliary dilatation.  Pancreas: Unremarkable  Spleen: Unremarkable  Adrenals/Urinary Tract: Moderate bilateral renal atrophy and cortical thinning noted, right greater than left. There is no evidence of hydronephrosis, renal mass or definite renal calculi.  Stomach/Bowel: There is no evidence of bowel obstruction. No definite bowel wall thickening noted. No dilated bowel loops are present. Hip replacement artifact obscures detail within the pelvis.  Vascular/Lymphatic: No enlarged lymph nodes or abdominal aortic aneurysm identified. Calcifications within the IVC, iliac and femoral veins noted.  Reproductive: Prostate not well visualized.  Other: No free fluid, abscess or pneumoperitoneum.  Musculoskeletal: Mild stranding in the left inguinal region is likely postoperative. Bilateral hip replacements identified. No acute bony abnormalities are identified. Degenerative changes in the lumbar spine and bilateral L5 pars defects again noted.  IMPRESSION: No evidence of primary malignancy.  Patchy left upper lobe ground-glass opacities - possible aspiration versus pneumonia.  Cardiomegaly, coronary artery disease, ectatic ascending thoracic aorta, COPD/emphysema and moderate bilateral renal atrophy.   Electronically Signed   By: Margarette Canada M.D.   On: 10/13/2014 14:10   Dg Chest Port 1  View  10/11/2014   CLINICAL DATA:  Shortness of breath.  EXAM: PORTABLE CHEST - 1 VIEW  COMPARISON:  10/11/2014 and 01/17/2014  FINDINGS: Right-sided pacemaker unchanged. Lungs are adequately inflated with stable chronic elevation of the left hemidiaphragm and stable chronic changes over the left lung. Known calcified pleural plaques. No focal lobar consolidation or effusion cardiomediastinal silhouette and remainder of the exam is unchanged.  IMPRESSION: No active disease.  Chronic stable changes of the left lung. Evidence of asbestos related pleural disease.   Electronically Signed   By: Marin Olp M.D.   On: 10/11/2014 21:56   Dg Ang/ext/uni/or Left  10/12/2014   CLINICAL DATA:  Femoral thrombectomy for embolic occlusion in the left lower extremity.  EXAM: LEFT ANG/EXT/UNI/ OR  COMPARISON:  None.  FINDINGS: Intraoperative image shows a normally patent popliteal artery  and normally patent visualized anterior tibial, peroneal and posterior tibial arteries to nearly the level of the ankle. No filling defects are identified.  IMPRESSION: No distal occlusions or filling defects identified at the level of the popliteal and tibial arteries.   Electronically Signed   By: Aletta Edouard M.D.   On: 10/12/2014 14:02   Medications: I have reviewed the patient's current medications. Scheduled Meds: . aspirin EC  81 mg Oral Daily  . buPROPion  75 mg Oral BID  . diltiazem  90 mg Oral 4 times per day  . feeding supplement (ENSURE ENLIVE)  237 mL Oral TID BM  . metoprolol  50 mg Oral BID  . pantoprazole  40 mg Oral Daily  . rosuvastatin  20 mg Oral q1800  . tamsulosin  0.4 mg Oral Daily  . tiotropium  18 mcg Inhalation Daily   Continuous Infusions: . heparin 1,000 Units/hr (10/13/14 1022)   PRN Meds:.ipratropium-albuterol, metoprolol, oxyCODONE-acetaminophen, senna-docusate   Assessment/Plan: 69 y/o M w/ PMHx of sick sinus syndrome s/p PPM, h/o atrial fibrillation/flutter non-compliant w/ Coumadin,  chronic thrombocytopenia, CKD, and chronic sCHF, admitted for STEMI, Atrial Fibrillation w/ RVR, and TIA symptoms, also found to have LLE arterial thrombus.   STEMI: Patient w/ ST elevation in inferior leads on EKG initially while in RVR. States he had some very mild chest pain this AM, however, this resolved on its own. Cardiology following. May be related to cardioembolic phenomemon. ECHO yesterday showed EF 30-35% and diffuse hypokinesis w/ no cardiac source of emboli. Troponin trend as follows:    Recent Labs Lab 10/11/14 1526 10/11/14 1903 10/11/14 2136 10/12/14 0426  TROPONINI 20.70* 17.01* 16.22* 13.21*  -Continue ASA 81 mg daily -Cardizem po 90 mg q4h as below -Heparin gtt; discontinuation per cardiology.  -Lopressor 50 mg bid -Start Crestor 20 mg qhs  Atrial Fibrillation w/ RVR: RVR resolved. Patient w/ known h/o atrial fibrillation/flutter w/ intermittent compliance w/ anticoagulation (Coumadin). Most likely etiology of TIA symptoms, MI, and LLE clot. Has been on Dilt gtt, now rate controlled in the 90's. Asymptomatic. This patients CHA2DS2-VASc Score and unadjusted Ischemic Stroke Rate (% per year) is equal to 9.7 % stroke rate/year from a score of 6.  -Continue Heparin gtt as above (also for ACS); start Coumadin today -Change Diltiazem to 90 mg qid; if this controls rate, can change to CD formulation tomorrow -Metoprolol as above -Discuss importance of compliance w/ anticoagulation.   LLE Arterial Emboli: Patient w/ severe pain in the LLE yesterday, went emergently for embolectomy. Pain resolved. Also most likely related to cardiogenic emboli given non-compliance w/ anticoagulation. Some concern for hypercoagulable state related to occult malignancy, however, CT scan of chest/abdomen/pelvis negative for occult malignancy.  -Management per [facility]; appreciate consult -Heparin, Coumadin as above  TIA/CVA: Patient initially a code stroke, symptoms resolved. Neuro following. CT head  w/out acute changes, no MRI gien PPM. Carotid dopplers negative today.  -ASA -Heparin, Coumadin -Statin as above -Appreciate neuro recs -PT/OT  AKI: Significantly improved, back to baseline. May also be related to embolic event. Cr trend as follows:   Recent Labs Lab 10/11/14 1205 10/11/14 1310 10/12/14 0426 10/13/14 0607  CREATININE 1.68* 1.60* 1.50* 1.31*  -Repeat BMP in AM  Trichomonas: Given Flagyl 2 g once.  Chronic sCHF: Stable. ECHO results as above.  -BB as above -Consider ACEi when/if pressure allows.   Dispo: Disposition is deferred at this time, awaiting improvement of current medical problems.  Anticipated discharge in approximately 1-2 day(s).  The patient does have a current PCP Bertha Stakes, MD) and does need an [facility] hospital follow-up appointment after discharge.  The patient does not have transportation limitations that hinder transportation to clinic appointments.  .Services Needed at time of discharge: Y = Yes, Blank = No PT:   OT:   RN:   Equipment:   Other:     LOS: 2 days   Corky Sox, MD 10/13/2014, 2:47 PM

## 2014-10-13 NOTE — Progress Notes (Signed)
SLP Cancellation Note  Patient Details Name: [name] MRN: [id number] DOB: 1946-04-20   Cancelled treatment:       Reason Eval/Treat Not Completed: Other (comment). Pt is NPO for CT abdomen. Pt passed stroke swallow and per RN has been drinking contrast without difficulty. Recommend MD d/c orders and reorder only if pt does not tolerate regular diet.    Mairi Stagliano, Katherene Ponto 10/13/2014, 11:53 AM

## 2014-10-13 NOTE — Progress Notes (Signed)
ANTICOAGULATION CONSULT NOTE - Follow Up Consult  Pharmacy Consult for Heparin, warfarin Indication: chest pain/ACS, a fib, VTE   Allergies  Allergen Reactions  . Digoxin And Related Other (See Comments)    Dig toxicity 2009  . Penicillins Rash    Patient Measurements: Height: '6\' 3"'  (190.5 cm) Weight: 153 lb 3.2 oz (69.491 kg) IBW/kg (Calculated) : 84.5  Ht  75 in    Wt: 69.9 kg IBW: 85kg Heparin Dosing Weight: 70 kg  Vital Signs: Temp: 99 F (37.2 C) (05/26 1206) Temp Source: Oral (05/26 1206) BP: 119/79 mmHg (05/26 0616) Pulse Rate: 125 (05/26 0616)  Labs:  Recent Labs  10/11/14 1205 10/11/14 1310  10/11/14 1903  10/11/14 2136 10/12/14 0426 10/12/14 1720 10/13/14 0607  HGB 14.6 16.7  --   --   --   --  13.4  --  12.5*  HCT 43.6 49.0  --   --   --   --  40.1  --  37.2*  PLT 131*  --   --   --   --   --  121*  --  122*  APTT 28  --   --   --   --   --   --   --   --   LABPROT 15.1  --   --   --   --   --   --   --   --   INR 1.18  --   --   --   --   --   --   --   --   HEPARINUNFRC  --   --   --   --   < > 0.40 0.11* 1.42* 0.34  CREATININE 1.68* 1.60*  --   --   --   --  1.50*  --  1.31*  TROPONINI  --   --   < > 17.01*  --  16.22* 13.21*  --   --   < > = values in this interval not displayed.  Estimated Creatinine Clearance: 51.6 mL/min (by C-G formula based on Cr of 1.31).   Medical History: Past Medical History  Diagnosis Date  . Hypertension   . Chronic systolic heart failure     a. NICM - EF 35% with normal cors 2003. b. Last echo 11/2012 - EF 25-30%.  . Depression   . GERD (gastroesophageal reflux disease)   . Portal vein thrombosis   . Avascular necrosis of bones of both hips     Status post bilateral hip replacements  . Gastritis   . Alcohol abuse   . Erectile dysfunction   . Bipolar disorder   . Hydrocele, unspecified   . Intermittent claudication   . Lung nodule     Chest CT scan on 12/14/2008 showed a nodular opacity at the left lung  base felt to most likely represent scarring.  Follow-up chest CT scan on 06/02/2010 showed parenchymal scarring in the left apex, left  lower lobe, and lingula; some of this scarring at the left base had a nodular appearance, unchanged. Chest 09/2012 - stable.  . 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  Thyroid scan 08/28/2009 was normal with no focal areas of abnormal increased or decreased activity seen; the uptake of I 131 sodium iodide at 24 hours was 5.7%.  TSH and free T4 normalized by 08/16/2009.  . Intestinal obstruction   . COPD (chronic obstructive pulmonary disease)   .  Clostridium difficile colitis   . E coli bacteremia   . DVT (deep venous thrombosis)     He has hypercoagulability with multiple prior DVTs  . Pleural plaque     H/o asbestos exposure. Chest CT on 06/02/2010 showed stable extensive calcified pleural plaques involving the left hemithorax, consistent with asbestos related pleural disease.  . Weight loss     Normal colonoscopy by Dr. Olevia Perches on 11/06/2010.  . Tachycardia-bradycardia syndrome     a. 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 b. [employer] Jude gen change 2010.  [name] PAF (paroxysmal atrial fibrillation)     Paroxysmal, failed medical therapy with amiodarone but has done very well with multaq;  d/c 2/2 to recurrent AFib;  Tikosyn load c/b Hyperkalemia during admx 05/2013 => rate control strategy  . 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  . Noncompliance   . Atrial flutter   . Thrombocytopenia   . Hyperkalemia     type 4 RTA  . Pacemaker   . Shortness of breath     "all the time now" (07/02/2013)  . On home oxygen therapy     "3L prn; need it maybe 3-4 times/wk" (10/11/2014)  . Pneumonia   . Cluster headaches     "~ twice/wk" (07/02/2013)  . Osteoarthritis   . Spondylosis   . Arthritis     "hips and legs" (07/02/2013)  .  Chronic back pain     "all over my back" (07/02/2013)  . Anxiety   . CKD (chronic kidney disease)     Renal U/S 12/04/2009 showed no pathological findings. Labs 12/04/2009 include normal ESR, C3, C4; neg ANA; SPEP showed nonspecific increase in the alpha-2 region with no M-spike; UPEP showed no monoclonal free light chains; urine IFE showed polyclonal increase in feree Kappa and/or free Lambda light chains. Baseline Cr reported 1.7-2.5.  . STEMI (ST elevation myocardial infarction) 10/11/2014  . CHF (congestive heart failure)     Medications:  See electronic med rec  Assessment: 69 y.o. male on Coumadin PTA with new LLE arterial thrombus s/p L femoral thrombectomy on IV heparin. HL this AM is therapeutic at 0.34 which is on lower end of goal spectrum. Patient had some blood tinged sputum today but no overt s/s of bleeding. Counseled patient to continue monitoring for signs of bleeding. H/H and Plt are low but stable.   Patient reports non-compliance to warfarin PTA, but reported was supposed to be on alternating doses of 5 mg and 7.5 mg.  Goal of Therapy:  Heparin level 0.3-0.7 units/ml  Monitor platelets by anticoagulation protocol: Yes   Plan:  -warfarin 7.5 mg x 1 -Continue Heparin IV 1000 units/hr -F/u 8 hr heparin level at 1900 -Daily heparin level, INR, CBC  Levester Fresh, PharmD, BCPS Clinical Pharmacist Pager [phone] 10/13/2014 4:07 PM

## 2014-10-13 NOTE — Progress Notes (Signed)
Patient ID: [name], male   DOB: 1945-08-26, 69 y.o.   MRN: [id number] Comfortable this morning. Some oozing from his left groin incision yesterday evening. Left groin dressing removed. No hematoma and no bleeding. Will replace dressing. 2+ left popliteal and 2+ left posterior tibial pulse. No active vascular issues. Continuing anticoagulation and cardiac evaluation

## 2014-10-13 NOTE — Progress Notes (Signed)
Initial Nutrition Assessment  DOCUMENTATION CODES:  Severe malnutrition in context of chronic illness  INTERVENTION:   Ensure Enlive (each supplement provides 350kcal and 20 grams of protein)   Recommend advance diet to regular after procedure today  NUTRITION DIAGNOSIS:  Malnutrition related to chronic illness as evidenced by percent weight loss, severe depletion of body fat, severe depletion of muscle mass (11% weight loss within 6 months).   GOAL:  Patient will meet greater than or equal to 90% of their needs   MONITOR:  Diet advancement, PO intake, Supplement acceptance, Weight trends, Labs  REASON FOR ASSESSMENT:  Malnutrition Screening Tool    ASSESSMENT:  Patient presented to the ED as a code stroke with slurred speech, drooling, numbness, dysarthria, and right sided weakness. Required emergency surgery (left femoral embolectomy and intraoperative arteriogram) 5/25 for embolus to left leg.  Patient with 3 months of decreased appetite and poor PO intake. Has lost 11% of usual weight within the past 6 months; ~14% weight loss overall. Nutrition-Focused physical exam completed. Findings are severe fat depletion, severe muscle depletion, and no edema.   Patient said that he used to drink Ensure and Boost supplements, but quit buying them because they are too expensive. RD provided coupons for Ensure. Currently NPO for CT of the chest/abd/pelvis today. Concern for malignancy. He is hungry and ready to eat, but does not want a heart healthy diet, wants a regular diet and has discussed this with his physician.  Height:  Ht Readings from Last 1 Encounters:  10/13/14 6\' 3"  (1.905 m)    Weight:  Wt Readings from Last 1 Encounters:  10/13/14 153 lb 3.2 oz (69.491 kg)    Ideal Body Weight:  89.1 kg  Wt Readings from Last 10 Encounters:  10/13/14 153 lb 3.2 oz (69.491 kg)  08/10/14 154 lb 1.6 oz (69.899 kg)  07/28/14 153 lb 8 oz (69.627 kg)  06/22/14 162 lb 4.8 oz  (73.619 kg)  06/01/14 171 lb 8 oz (77.792 kg)  01/25/14 169 lb 9.6 oz (76.93 kg)  01/21/14 166 lb 6.4 oz (75.479 kg)  01/17/14 163 lb (73.936 kg)  01/04/14 171 lb 8 oz (77.792 kg)  12/30/13 175 lb (79.379 kg)    BMI:  Body mass index is 19.15 kg/(m^2).  Estimated Nutritional Needs:  Kcal:  2200-2400  Protein:  110-130 gm  Fluid:  2.2 L  Skin:  Reviewed, no issues  Diet Order:  Diet NPO time specified Except for: Sips with Meds  EDUCATION NEEDS:  Education needs addressed   Intake/Output Summary (Last 24 hours) at 10/13/14 1207 Last data filed at 10/13/14 0400  Gross per 24 hour  Intake    900 ml  Output    800 ml  Net    100 ml    Last BM:  5/22   Molli Barrows, RD, LDN, [place] Pager [phone] After Hours Pager [phone]

## 2014-10-13 NOTE — Progress Notes (Signed)
Dr. Ethelene Hal on the floor. Made him aware of groin site and that pressure dressing had been applied. Dr. Ethelene Hal removed dressing to assess and I applied new sterile dressing. Dr. Ethelene Hal and second resident assessed site and pulses. Also made them aware that patient's temp is 99.9 oral and Flagyl ordered in the morning had not been given occurdding the MAR.

## 2014-10-13 NOTE — Progress Notes (Signed)
Patients BP 109/76 HR 90s-110s. Updated Dr. Ethelene Hal, he would like Cardizem drip restarted if HR is above 110 sustained and this time it is not.

## 2014-10-13 NOTE — Progress Notes (Signed)
MEDICATION RELATED NOTE   Pharmacy Re:  Drug/Drug Interaction   Drug-Drug Interaction Report  Diltiazem / Atorvastatin  Significance: Major  Warning: Plasma concentrations and pharmacologic effects of atorvastatin may be increased by co-administration of diltiazem. The risk of myopathy and rhabdomyolysis may be increased.  Onset: Delayed Document Level: Possible  Interacting Medications/Orders:  Diltiazem Oral or Non-Oral, Systemic Atorvastatin Oral, Systemic  1. diltiazem Order ([id number]): diltiazem (CARDIZEM) 100 mg in dextrose 5 % 100 mL (1 mg/mL) infusion Route: Intravenous Start: 10/11/2014 1745 End: none Frequency: Titrated 1. atorvastatin Order ([id number]): atorvastatin (LIPITOR) tablet 40 mg Route: Oral Start: 10/13/2014 1800 End: none Frequency: Daily-1800   Management Code: Professional review suggested  Effects: Plasma concentrations and pharmacologic effects of atorvastatin may be increased by co-administration of diltiazem. The risk of myopathy and rhabdomyolysis may be increased.  Mechanism: Inhibition of CYP3A4 isoenzymes by diltiazem may decrease the metabolic elimination of atorvastatin.  Management: Clinical monitoring for the development of muscle aches and weakness and additional laboratory monitoring for atorvastatin-related adverse effects are indicated especially in patients stabilized on atorvastatin that are initiating diltiazem. In patients receiving diltiazem, a lower atorvastatin starting dose may be considered. On the other hand, pravastatin, fluvastatin, pitavastatin or rosuvastatin may be an acceptable alternative to atorvastatin in patients receiving diltiazem.   Plan:  Consider changing statin therapy to Rosuvastatin or Pravastatin to avoid potential drug/drug interaction with Diltiazem  Rober Minion, PharmD., MS Clinical Pharmacist Pager:  [phone] Thank you for allowing pharmacy to be part of this patients care team. 10/13/2014,10:31  AM

## 2014-10-13 NOTE — Progress Notes (Signed)
Patient BP 90/44, HR 60-70s Cardizem at 10/hr was stopped and Dr. Ethelene Hal paged. Orders to keep Cardizem off at this time and call if HR goes up. Groin site assessed by myself and Dr. Ethelene Hal and no complications at this time.

## 2014-10-13 NOTE — Progress Notes (Addendum)
STROKE TEAM PROGRESS NOTE   HISTORY [name] is an 69 y.o. male male with multiple medical problems including PAF, DVT on chronic coumadin, HTN, and CKD. Today he was driving to his friends house to get his car fixed. At 11:15 he noted he could not get his words out correctly and he had a right facial droop (LKW 10/11/2014 at 1115). EMS was called and he was brought to [facility] as code stroke. On arrival he had a right facial droop and had sever dysarthria. HE had a HR 190 in Afib on arrival. Patient was placed supine and HR slowed to 150 while in CT. 10 minutes later his symptoms fully resolved. He admits to being poorly compliant with his coumadin. While in ED he was noted to have a EKG consistent with MI and Troponin 25. Modified Rankin: Rankin Score=0 Patient was not administered TPA secondary to symptoms resolved. He was admitted for further evaluation and treatment.   SUBJECTIVE (INTERVAL HISTORY) No family is at the bedside.  Overall he feels his condition is stable. He had STEMI and still on heparin drip. He also had TIA episode now resolved but not able have MRI due to pacer. He had left leg embolus for which he had left femoral embolectomy done yesterday. Overnight no acute issues.   OBJECTIVE Temp:  [98.6 F (37 C)-99.9 F (37.7 C)] 99 F (37.2 C) (05/26 1206) Pulse Rate:  [62-152] 125 (05/26 0616) Cardiac Rhythm:  [-] Atrial fibrillation (05/26 0830) Resp:  [0-25] 19 (05/26 0616) BP: (90-124)/(44-93) 119/79 mmHg (05/26 0616) SpO2:  [80 %-100 %] 97 % (05/26 0616) Weight:  [153 lb 3.2 oz (69.491 kg)] 153 lb 3.2 oz (69.491 kg) (05/26 0430)   Recent Labs Lab 10/12/14 1314  GLUCAP 138*    Recent Labs Lab 10/11/14 1205 10/11/14 1310 10/11/14 1903 10/11/14 2136 10/12/14 0426 10/13/14 0341 10/13/14 0607  NA 143 143  --   --  141  --  138  K 4.1 4.0  --   --  4.5  --  4.3  CL 109 107  --   --  107  --  108  CO2 24  --   --   --  24  --  24  GLUCOSE 116* 114*  --    --  85  --  86  BUN 17 20  --   --  15  --  13  CREATININE 1.68* 1.60*  --   --  1.50*  --  1.31*  CALCIUM 9.3  --   --   --  8.7*  --  8.4*  MG  --   --  2.0 1.8 1.8 1.9  --     Recent Labs Lab 10/11/14 1205 10/12/14 0426  AST 72* 57*  ALT 15* 17  ALKPHOS 76 66  BILITOT 1.2 1.2  PROT 7.2 5.8*  ALBUMIN 3.7 3.0*    Recent Labs Lab 10/11/14 1205 10/11/14 1310 10/12/14 0426 10/13/14 0607  WBC 7.8  --  5.9 9.9  NEUTROABS 5.4  --   --   --   HGB 14.6 16.7 13.4 12.5*  HCT 43.6 49.0 40.1 37.2*  MCV 90.8  --  91.3 91.2  PLT 131*  --  121* 122*    Recent Labs Lab 10/11/14 1526 10/11/14 1903 10/11/14 2136 10/12/14 0426  TROPONINI 20.70* 17.01* 16.22* 13.21*    Recent Labs  10/11/14 1205  LABPROT 15.1  INR 1.18    Recent Labs  10/11/14  [place] 1.010  PHURINE 7.0  GLUCOSEU NEGATIVE  HGBUR MODERATE*  BILIRUBINUR NEGATIVE  KETONESUR NEGATIVE  PROTEINUR NEGATIVE  UROBILINOGEN 1.0  NITRITE NEGATIVE  LEUKOCYTESUR MODERATE*       Component Value Date/Time   CHOL 92 10/12/2014 0426   TRIG 31 10/12/2014 0426   TRIG 49 02/06/2010   HDL 36* 10/12/2014 0426   CHOLHDL 2.6 10/12/2014 0426   VLDL 6 10/12/2014 0426   LDLCALC 50 10/12/2014 0426   Lab Results  Component Value Date   HGBA1C 5.6 10/11/2014      Component Value Date/Time   LABOPIA NONE DETECTED 10/11/2014 1724   LABOPIA NEGATIVE 12/27/2008 1943   COCAINSCRNUR NONE DETECTED 10/11/2014 1724   COCAINSCRNUR NEG 07/06/2009 2225   LABBENZ NONE DETECTED 10/11/2014 1724   LABBENZ NEG 07/06/2009 2225   AMPHETMU NONE DETECTED 10/11/2014 1724   AMPHETMU NEG 07/06/2009 2225   THCU POSITIVE* 10/11/2014 1724   LABBARB NONE DETECTED 10/11/2014 1724     Recent Labs Lab 10/11/14 1305  ETH <5    Ct Head Wo Contrast  10/11/2014   IMPRESSION: Negative exam.  No change from priors.    2D echo - - Left ventricle: The cavity size was normal. Systolic function was moderately to  severely reduced. The estimated ejection fraction was in the range of 30% to 35%. Diffuse hypokinesis. - Aortic valve: There was mild regurgitation. - Right ventricle: The cavity size was mildly dilated. Wall thickness was normal. Systolic function was moderately reduced. - Right atrium: The atrium was mildly dilated. Impressions: - No cardiac source of emboli was indentified.  CUS - Bilateral: 1-39% ICA stenosis. Vertebral artery flow is antegrade.  PHYSICAL EXAM  Temp:  [98.6 F (37 C)-99.9 F (37.7 C)] 99 F (37.2 C) (05/26 1206) Pulse Rate:  [62-152] 125 (05/26 0616) Resp:  [0-25] 19 (05/26 0616) BP: (90-124)/(44-93) 119/79 mmHg (05/26 0616) SpO2:  [80 %-100 %] 97 % (05/26 0616) Weight:  [153 lb 3.2 oz (69.491 kg)] 153 lb 3.2 oz (69.491 kg) (05/26 0430)  General - Well nourished, well developed, in no apparent distress.  Ophthalmologic - Fundi not visualized due to noncooperation.  Cardiovascular - irregularly irregular heart rate and rhythm.  Mental Status -  Level of arousal and orientation to time, place, and person were intact. Language including expression, naming, repetition, comprehension was assessed and found intact. Fund of Knowledge was assessed and was intact.  Cranial Nerves II - XII - II - Visual field intact OU. III, IV, VI - Extraocular movements intact. V - Facial sensation intact bilaterally. VII - Facial movement intact bilaterally. VIII - Hearing & vestibular intact bilaterally. X - Palate elevates symmetrically. XI - Chin turning & shoulder shrug intact bilaterally. XII - Tongue protrusion intact.  Motor Strength - The patient's strength was normal in all extremities and pronator drift was absent.  Bulk was normal and fasciculations were absent.   Motor Tone - Muscle tone was assessed at the neck and appendages and was normal.  Reflexes - The patient's reflexes were 1+ in all extremities and he had no pathological reflexes.  Sensory -  Light touch, temperature/pinprick, vibration and proprioception, and Romberg testing were assessed and were symmetrical.    Coordination - The patient had normal movements in the hands and feet with no ataxia or dysmetria.  Tremor was absent.  Gait and Station - deferred due to safety concerns.   ASSESSMENT/PLAN Mr. KEMOND AMORIN is a 69 y.o. 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 He did not receive IV t-PA due to resolved symptoms.   Stroke vs. TIA:  Dominant left hemisphere, embolic secondary to atrial fibrillation noncompliant with Coumadin  Resultant  symptoms resolved  MRI  pacer  MRA  pacer  Carotid Doppler  pending  2D Echo  EF 30-35%  TEE done 05/2013 EF 45-50%, smoke LA, no PFO, no shunt  LDL 50  HgbA1c 5.6  IV heparin for VTE prophylaxis Diet regular Room service appropriate?: Yes; Fluid consistency:: Thin  aspirin 81 mg orally every day and warfarin prior to admission, now on aspirin 81 mg orally every day and heparin IV. Consider to transition to Coumadin as appropriate.  Patient counseled to be compliant with his antithrombotic medications  Ongoing aggressive stroke risk factor management  Atrial Fibrillation with RVR  Home Anticoagulation meds:  WARFARIN, non compliant, INR 1.18 on admission  cardizem prior to admission, now on Cardizem drip  Rate controlled  Cardiology on board  Currently on heparin IV  Patient need long-term anticoagulation with Coumadin as outpatient. Consider transition to Coumadin as appropriate  STEMI  Elevated troponins  New t-wave inversion, ST elevation  Full intensity IV heparin  Cardiology on board  Troponin trending down  Left leg embolus  Status post embolectomy  Vascular surgery on board  Heparin IV   Hypertension  Home meds:    LOPRESSOR  Stable  Patient counseled to be compliant with his blood pressure medications  Ischemic cardiomyopathy  EF 30-35%  Heparin IV  Consider transition to Coumadin as appropriate  Other Stroke Risk Factors  Advanced age  Former Cigarette smoker, quit smoking 18 months ago  Hx ETOH use, stopped 2012  THC positive this admission  Family hx stroke (mother)  Other Active Problems  Chronic systolic HF  Pacemaker  COPD. 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 Please call with questions. Follow-up with neurology as needed. Thanks for the consult.  Rosalin Hawking, MD PhD Stroke Neurology 10/13/2014 6:40 PM    To contact Stroke Continuity provider, please refer to [email or web address] After hours, contact General Neurology

## 2014-10-13 NOTE — Progress Notes (Signed)
Assessed left groin, guaze was saturated with blood, site did not appear to be actively bleeding at the time. Charge nurse made aware and assessed site as well. Paged Rapid Response nurse to further assess and charge nurse page Dr. Donnetta Hutching with Vascular. Will continue to assess.

## 2014-10-13 NOTE — Progress Notes (Signed)
Subjective: Left leg pain; denies dyspnea or CP  Objective: Vital signs in last 24 hours: Temp:  [97.2 F (36.2 C)-99.9 F (37.7 C)] 98.6 F (37 C) (05/26 0430) Pulse Rate:  [50-152] 125 (05/26 0616) Resp:  [0-25] 19 (05/26 0616) BP: (90-124)/(44-93) 119/79 mmHg (05/26 0616) SpO2:  [80 %-100 %] 97 % (05/26 0616) Weight:  [153 lb 3.2 oz (69.491 kg)] 153 lb 3.2 oz (69.491 kg) (05/26 0430) Last BM Date: 10/09/14  Intake/Output from previous day: 05/25 0701 - 05/26 0700 In: 900 [I.V.:900] Out: 800 [Urine:800] Intake/Output this shift:    Medications Scheduled Meds: . aspirin EC  81 mg Oral Daily  . buPROPion  75 mg Oral BID  . metoprolol  25 mg Oral BID  . pantoprazole  40 mg Oral Daily  . tamsulosin  0.4 mg Oral Daily  . tiotropium  18 mcg Inhalation Daily   Continuous Infusions: . sodium chloride 100 mL/hr at 10/12/14 1443  . diltiazem (CARDIZEM) infusion 10 mg/hr (10/13/14 0649)  . heparin 950 Units/hr (10/12/14 1926)   PRN Meds:.ipratropium-albuterol, metoprolol, oxyCODONE-acetaminophen, senna-docusate  PE: General appearance: alert, cooperative NAD HEENT normal  Neck supple Lungs: Mildly diminished BS Heart: irregularly irregular rhythm, trchycardic Abdomen: +BS, nontender Extremities: No LEE S/p LLE surgery; 2 + DP Skin: Warm and dry Neurologic: Grossly normal  Lab Results:   Recent Labs  10/11/14 1205 10/11/14 1310 10/12/14 0426 10/13/14 0607  WBC 7.8  --  5.9 9.9  HGB 14.6 16.7 13.4 12.5*  HCT 43.6 49.0 40.1 37.2*  PLT 131*  --  121* 122*   BMET  Recent Labs  10/11/14 1205 10/11/14 1310 10/12/14 0426 10/13/14 0607  NA 143 143 141 138  K 4.1 4.0 4.5 4.3  CL 109 107 107 108  CO2 24  --  24 24  GLUCOSE 116* 114* 85 86  BUN 17 20 15 13   CREATININE 1.68* 1.60* 1.50* 1.31*  CALCIUM 9.3  --  8.7* 8.4*   PT/INR  Recent Labs  10/11/14 1205  LABPROT 15.1  INR 1.18   Cholesterol  Recent Labs  10/12/14 0426  CHOL 92   Lipid  Panel     Component Value Date/Time   CHOL 92 10/12/2014 0426   TRIG 31 10/12/2014 0426   TRIG 49 02/06/2010   HDL 36* 10/12/2014 0426   CHOLHDL 2.6 10/12/2014 0426   VLDL 6 10/12/2014 0426   LDLCALC 50 10/12/2014 0426   Cardiac Panel (last 3 results)  Recent Labs  10/11/14 1903 10/11/14 2136 10/12/14 0426  TROPONINI 17.01* 16.22* 13.21*     Assessment/Plan  Principal Problem:   STEMI (ST elevation myocardial infarction) Likely secondary to emboli from not taking coumadin.  This would also explain TIA and ischemic left leg.  Troponin trending down from a peak POC of 25.83(see above).  Continue IV heparin. Begin Coumadin when okay with surgery.Will not plan cath.  EF 30-35% with diffuse hypokinesis. Continue metoprolol and change to toprol when dose stable. Add statin. Add ACEI later if BP allows.     Atrial fibrillation with RVR  Continue heparin. This will need to be continued until INR greater than or equal to 2. I encouraged compliance and explained the risks of not taking Coumadin. Given presentation I believe his TIA, myocardial infarction and embolic event to left lower extremity all related to atrial fibrillation. Increase metoprolol to 50 mg by mouth twice a day and decrease Cardizem if possible. I would like to treat with beta  blockade given reduced LV function if possible. No plans for TEE    Left leg embolic event S/P left embolectomy; per vascular surgery    TIA (transient ischemic attack)    Essential hypertension  Controlled.    Chronic kidney disease, 3  Stable.  Baseline ~1.6    Chronic combined systolic and diastolic congestive heart failure  Appears euvolemic but watch volume status with low EF  FU Dr Rayann Heman and Dr Harrington Challenger following DC    LOS: 2 days    Kirk Ruths MD

## 2014-10-13 NOTE — Progress Notes (Signed)
ANTICOAGULATION CONSULT NOTE - Follow Up Consult  Pharmacy Consult for Heparin Indication: chest pain/ACS   Allergies  Allergen Reactions  . Digoxin And Related Other (See Comments)    Dig toxicity 2009  . Penicillins Rash    Patient Measurements: Weight: 153 lb 3.2 oz (69.491 kg)  Ht  75 in    Wt: 69.9 kg IBW: 85kg Heparin Dosing Weight: 70 kg  Vital Signs: Temp: 98.6 F (37 C) (05/26 0430) Temp Source: Oral (05/26 0311) BP: 119/79 mmHg (05/26 0616) Pulse Rate: 125 (05/26 0616)  Labs:  Recent Labs  10/11/14 1205 10/11/14 1310  10/11/14 1903  10/11/14 2136 10/12/14 0426 10/12/14 1720 10/13/14 0607  HGB 14.6 16.7  --   --   --   --  13.4  --  12.5*  HCT 43.6 49.0  --   --   --   --  40.1  --  37.2*  PLT 131*  --   --   --   --   --  121*  --  122*  APTT 28  --   --   --   --   --   --   --   --   LABPROT 15.1  --   --   --   --   --   --   --   --   INR 1.18  --   --   --   --   --   --   --   --   HEPARINUNFRC  --   --   --   --   < > 0.40 0.11* 1.42* 0.34  CREATININE 1.68* 1.60*  --   --   --   --  1.50*  --  1.31*  TROPONINI  --   --   < > 17.01*  --  16.22* 13.21*  --   --   < > = values in this interval not displayed.  Estimated Creatinine Clearance: 51.6 mL/min (by C-G formula based on Cr of 1.31).   Medical History: Past Medical History  Diagnosis Date  . Hypertension   . Chronic systolic heart failure     a. NICM - EF 35% with normal cors 2003. b. Last echo 11/2012 - EF 25-30%.  . Depression   . GERD (gastroesophageal reflux disease)   . Portal vein thrombosis   . Avascular necrosis of bones of both hips     Status post bilateral hip replacements  . Gastritis   . Alcohol abuse   . Erectile dysfunction   . Bipolar disorder   . Hydrocele, unspecified   . Intermittent claudication   . Lung nodule     Chest CT scan on 12/14/2008 showed a nodular opacity at the left lung base felt to most likely represent scarring.  Follow-up chest CT scan on  06/02/2010 showed parenchymal scarring in the left apex, left  lower lobe, and lingula; some of this scarring at the left base had a nodular appearance, unchanged. Chest 09/2012 - stable.  . 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  Thyroid scan 08/28/2009 was normal with no focal areas of abnormal increased or decreased activity seen; the uptake of I 131 sodium iodide at 24 hours was 5.7%.  TSH and free T4 normalized by 08/16/2009.  . Intestinal obstruction   . COPD (chronic obstructive pulmonary disease)   . Clostridium difficile colitis   . E coli bacteremia   .  DVT (deep venous thrombosis)     He has hypercoagulability with multiple prior DVTs  . Pleural plaque     H/o asbestos exposure. Chest CT on 06/02/2010 showed stable extensive calcified pleural plaques involving the left hemithorax, consistent with asbestos related pleural disease.  . Weight loss     Normal colonoscopy by Dr. Olevia Perches on 11/06/2010.  . Tachycardia-bradycardia syndrome     a. 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 b. [employer] gen change 2010.  [name] PAF (paroxysmal atrial fibrillation)     Paroxysmal, failed medical therapy with amiodarone but has done very well with multaq;  d/c 2/2 to recurrent AFib;  Tikosyn load c/b Hyperkalemia during admx 05/2013 => rate control strategy  . 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  . Noncompliance   . Atrial flutter   . Thrombocytopenia   . Hyperkalemia     type 4 RTA  . Pacemaker   . Shortness of breath     "all the time now" (07/02/2013)  . On home oxygen therapy     "3L prn; need it maybe 3-4 times/wk" (10/11/2014)  . Pneumonia   . Cluster headaches     "~ twice/wk" (07/02/2013)  . Osteoarthritis   . Spondylosis   . Arthritis     "hips and legs" (07/02/2013)  . Chronic back pain     "all over my back" (07/02/2013)  . Anxiety   . CKD  (chronic kidney disease)     Renal U/S 12/04/2009 showed no pathological findings. Labs 12/04/2009 include normal ESR, C3, C4; neg ANA; SPEP showed nonspecific increase in the alpha-2 region with no M-spike; UPEP showed no monoclonal free light chains; urine IFE showed polyclonal increase in feree Kappa and/or free Lambda light chains. Baseline Cr reported 1.7-2.5.  . STEMI (ST elevation myocardial infarction) 10/11/2014    Medications:  See electronic med rec  Assessment: 69 y.o. male on Coumadin PTA with new LLE arterial thrombus s/p L femoral thrombectomy on IV heparin. HL this AM is therapeutic at 0.34 which is on lower end of goal spectrum. Patient had some blood tinged sputum today but no overt s/s of bleeding. Counseled patient to continue monitoring for signs of bleeding. H/H and Plt are low but stable.   Goal of Therapy:  Heparin level 0.3-0.7 units/ml  Monitor platelets by anticoagulation protocol: Yes   Plan:  -Increase Heparin IV gtt to 1000 units/hr -F/u 8 hr heparin level at 1900 -Daily heparin level and CBC -Coumadin vs. DOAC  Albertina Parr, PharmD., BCPS Clinical Pharmacist Pager [phone]

## 2014-10-13 NOTE — Progress Notes (Signed)
OT Cancellation Note  Patient Details Name: [name] MRN: [id number] DOB: 09/25/1945   Cancelled Treatment:    Reason Eval/Treat Not Completed: Medical issues which prohibited therapy (on Cardiazem for incresed HR, s/p femoral embolectomy 5/25.) Acute OT to follow up as available when pt is medically ready.   Villa Herb M   Cyndie Chime, OTR/L Occupational Therapist [phone] (pager)  10/13/2014, 7:46 AM

## 2014-10-13 NOTE — Progress Notes (Signed)
Restarted Cardizem drip, updated Dr. Ethelene Hal.

## 2014-10-14 DIAGNOSIS — A599 Trichomoniasis, unspecified: Secondary | ICD-10-CM

## 2014-10-14 DIAGNOSIS — B9561 Methicillin susceptible Staphylococcus aureus infection as the cause of diseases classified elsewhere: Secondary | ICD-10-CM

## 2014-10-14 LAB — PROTIME-INR
INR: 1.27 (ref 0.00–1.49)
PROTHROMBIN TIME: 16 s — AB (ref 11.6–15.2)

## 2014-10-14 LAB — HEPARIN LEVEL (UNFRACTIONATED)
Heparin Unfractionated: 0.31 IU/mL (ref 0.30–0.70)
Heparin Unfractionated: 0.49 IU/mL (ref 0.30–0.70)

## 2014-10-14 LAB — CBC
HCT: 36.5 % — ABNORMAL LOW (ref 39.0–52.0)
Hemoglobin: 12.3 g/dL — ABNORMAL LOW (ref 13.0–17.0)
MCH: 30.8 pg (ref 26.0–34.0)
MCHC: 33.7 g/dL (ref 30.0–36.0)
MCV: 91.5 fL (ref 78.0–100.0)
Platelets: 125 10*3/uL — ABNORMAL LOW (ref 150–400)
RBC: 3.99 MIL/uL — ABNORMAL LOW (ref 4.22–5.81)
RDW: 14.5 % (ref 11.5–15.5)
WBC: 8.7 10*3/uL (ref 4.0–10.5)

## 2014-10-14 MED ORDER — WARFARIN SODIUM 7.5 MG PO TABS
7.5000 mg | ORAL_TABLET | Freq: Once | ORAL | Status: AC
  Administered 2014-10-14: 7.5 mg via ORAL
  Filled 2014-10-14: qty 1

## 2014-10-14 MED ORDER — METOPROLOL TARTRATE 50 MG PO TABS
75.0000 mg | ORAL_TABLET | Freq: Two times a day (BID) | ORAL | Status: DC
  Administered 2014-10-14 – 2014-10-16 (×5): 75 mg via ORAL
  Filled 2014-10-14 (×10): qty 1

## 2014-10-14 MED ORDER — DILTIAZEM HCL 30 MG PO TABS
30.0000 mg | ORAL_TABLET | Freq: Four times a day (QID) | ORAL | Status: DC
  Administered 2014-10-14 – 2014-10-15 (×5): 30 mg via ORAL
  Filled 2014-10-14 (×5): qty 1

## 2014-10-14 NOTE — Progress Notes (Addendum)
ANTICOAGULATION CONSULT NOTE - Follow Up Consult  Pharmacy Consult for Heparin, warfarin Indication: chest pain/ACS, a fib, VTE   Allergies  Allergen Reactions  . Digoxin And Related Other (See Comments)    Dig toxicity 2009  . Penicillins Rash    Patient Measurements: Height: '6\' 3"'  (190.5 cm) Weight: 147 lb 14.4 oz (67.087 kg) IBW/kg (Calculated) : 84.5  Ht  75 in    Wt: 69.9 kg IBW: 85kg Heparin Dosing Weight: 70 kg  Vital Signs: Temp: 98.2 F (36.8 C) (05/27 0835) Temp Source: Oral (05/27 0835) BP: 119/83 mmHg (05/27 0954) Pulse Rate: 71 (05/27 0954)  Labs:  Recent Labs  10/11/14 1205 10/11/14 1310  10/11/14 1903  10/11/14 2136 10/12/14 0426  10/13/14 0607 10/13/14 2017 10/14/14 0544  HGB 14.6 16.7  --   --   --   --  13.4  --  12.5*  --  12.3*  HCT 43.6 49.0  --   --   --   --  40.1  --  37.2*  --  36.5*  PLT 131*  --   --   --   --   --  121*  --  122*  --  125*  APTT 28  --   --   --   --   --   --   --   --   --   --   LABPROT 15.1  --   --   --   --   --   --   --   --   --  16.0*  INR 1.18  --   --   --   --   --   --   --   --   --  1.27  HEPARINUNFRC  --   --   --   --   < > 0.40 0.11*  < > 0.34 0.24* 0.31  CREATININE 1.68* 1.60*  --   --   --   --  1.50*  --  1.31*  --   --   TROPONINI  --   --   < > 17.01*  --  16.22* 13.21*  --   --   --   --   < > = values in this interval not displayed.  Estimated Creatinine Clearance: 49.8 mL/min (by C-G formula based on Cr of 1.31).   Medical History: Past Medical History  Diagnosis Date  . Hypertension   . Chronic systolic heart failure     a. NICM - EF 35% with normal cors 2003. b. Last echo 11/2012 - EF 25-30%.  . Depression   . GERD (gastroesophageal reflux disease)   . Portal vein thrombosis   . Avascular necrosis of bones of both hips     Status post bilateral hip replacements  . Gastritis   . Alcohol abuse   . Erectile dysfunction   . Bipolar disorder   . Hydrocele, unspecified   .  Intermittent claudication   . Lung nodule     Chest CT scan on 12/14/2008 showed a nodular opacity at the left lung base felt to most likely represent scarring.  Follow-up chest CT scan on 06/02/2010 showed parenchymal scarring in the left apex, left  lower lobe, and lingula; some of this scarring at the left base had a nodular appearance, unchanged. Chest 09/2012 - stable.  . 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  Thyroid scan 08/28/2009 was normal  with no focal areas of abnormal increased or decreased activity seen; the uptake of I 131 sodium iodide at 24 hours was 5.7%.  TSH and free T4 normalized by 08/16/2009.  . Intestinal obstruction   . COPD (chronic obstructive pulmonary disease)   . Clostridium difficile colitis   . E coli bacteremia   . DVT (deep venous thrombosis)     He has hypercoagulability with multiple prior DVTs  . Pleural plaque     H/o asbestos exposure. Chest CT on 06/02/2010 showed stable extensive calcified pleural plaques involving the left hemithorax, consistent with asbestos related pleural disease.  . Weight loss     Normal colonoscopy by Dr. Olevia Perches on 11/06/2010.  . Tachycardia-bradycardia syndrome     a. 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 b. [employer] gen change 2010.  [name] PAF (paroxysmal atrial fibrillation)     Paroxysmal, failed medical therapy with amiodarone but has done very well with multaq;  d/c 2/2 to recurrent AFib;  Tikosyn load c/b Hyperkalemia during admx 05/2013 => rate control strategy  . 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  . Noncompliance   . Atrial flutter   . Thrombocytopenia   . Hyperkalemia     type 4 RTA  . Pacemaker   . Shortness of breath     "all the time now" (07/02/2013)  . On home oxygen therapy     "3L prn; need it maybe 3-4 times/wk" (10/11/2014)  . Pneumonia   . Cluster headaches      "~ twice/wk" (07/02/2013)  . Osteoarthritis   . Spondylosis   . Arthritis     "hips and legs" (07/02/2013)  . Chronic back pain     "all over my back" (07/02/2013)  . Anxiety   . CKD (chronic kidney disease)     Renal U/S 12/04/2009 showed no pathological findings. Labs 12/04/2009 include normal ESR, C3, C4; neg ANA; SPEP showed nonspecific increase in the alpha-2 region with no M-spike; UPEP showed no monoclonal free light chains; urine IFE showed polyclonal increase in feree Kappa and/or free Lambda light chains. Baseline Cr reported 1.7-2.5.  . STEMI (ST elevation myocardial infarction) 10/11/2014  . CHF (congestive heart failure)     Medications:  Heparin infusion at 1200 units/hr  Assessment: 69 y.o. male on Coumadin PTA (noncompliant) presented with new LLE arterial thrombus s/p L femoral thrombectomy on IV heparin. Heparin level this AM is therapeutic at lower end of goal. INR essentially at baseline after first Coumadin dose.  H/H and Plt are low but stable.   Patient reports non-compliance to warfarin PTA, but reported was supposed to be on alternating doses of 5 mg and 7.5 mg.  Goal of Therapy:  Heparin level 0.3-0.7 units/ml  INR 2-3 Monitor platelets by anticoagulation protocol: Yes   Plan:  Increase heparin to 1300 units/hr. Heparin level at 1800 tonight. Repeat Coumadin 7.47m PO x 1 tonight Continue daily heparin level, CBC, and INR  [employer] Pharm.D., BCPS Clinical Pharmacist Pager [phone] 91625/27/2016 11:24 AM   Addendum:  Heparin level came back at 0.49.   Cont current rate at 1300 units/hr F/u with level in AM  MOnnie Boer PharmD Pager: [phone] 16355/27/2016 8:03 PM

## 2014-10-14 NOTE — Progress Notes (Signed)
PT Cancellation Note  Patient Details Name: [name] MRN: [id number] DOB: 1946/04/29   Cancelled Treatment:    Reason Eval/Treat Not Completed: Patient declined, no reason specified-"not today". Attempted PT eval-pt declined to participate on today. Explained reason for visit  however pt stated no one told him and that he needs to be informed ahead of time. Pt did state PT could check back tomorrow.    Weston Anna, MPT Pager: [phone]

## 2014-10-14 NOTE — Progress Notes (Signed)
OT Cancellation Note  Patient Details Name: [name] MRN: [id number] DOB: [birth year]   Cancelled Treatment:    Reason Eval/Treat Not Completed: Other (comment);Patient declined, no reason specified Pt stated he needed to "know when things were going to happen". Declined to get [place], OTR/L  [phone] 10/14/2014 10/14/2014, 10:39 AM

## 2014-10-14 NOTE — Progress Notes (Signed)
Inpatient Progress Note - Internal Medicine  Subjective: [name] reports feeling well this morning and looks much improved. He reports some mild pain in his L groin at thrombectomy incision when he moves in bed but no pain at rest. He does endorse continued hip pain stable from before admission and well controlled with current meds. He denies CP; he does endorse episodes of palpitations and SOB, relieved with O2. He is intermittently on 3L O2 at home. He reports his nurse told him he was not tachycardic during his one episode of palpitations/SOB O/N. The patient denies difficulty eating or drinking and is taking nutritional supplement shakes, but does endorse stable poor appetite. He reports Marinol and another medication he could not recall helped him; he is not currently taking these at home.  Objective: Vital signs in last 24 hours: Filed Vitals:   10/14/14 0608 10/14/14 0835 10/14/14 0954 10/14/14 1126  BP: 108/78 115/83 119/83 108/77  Pulse:  77 71   Temp:  98.2 F (36.8 C)    TempSrc:  Oral    Resp:  18    Height:      Weight: 67.087 kg (147 lb 14.4 oz)     SpO2:  99%     Weight change: -2.404 kg (-5 lb 4.8 oz)  Intake/Output Summary (Last 24 hours) at 10/14/14 1140 Last data filed at 10/14/14 0500  Gross per 24 hour  Intake      0 ml  Output   1900 ml  Net  -1900 ml   General: Calm, conversant cachectic man lying comfortably in bed. HEENT:  EOMI, sclerae/conjunctiva clear, mmm no oropharyngeal erythema, adentulate Cardiac: Irregularly irregular rhythm, regular rate, no m/r/g Pulmonary:  Diffuse rhonchi, adequate air movement, no increased WOB Abdominal: Soft, scaphoid, nt/nd, no hepatosplenomegaly, normoactive bowel sounds Extremities: No cyanosis or clubbing. No pedal edema. 2+ L dorsal pedal pulse, 1+ R dorsal pedal pulse Neurologic:  Conversant, grossly oriented to conversation. Moving all 4 limbs spontaneously.  Lab Results: Basic Metabolic Panel:  Recent  Labs Lab 10/12/14 0426 10/13/14 0341 10/13/14 0607  NA 141  --  138  K 4.5  --  4.3  CL 107  --  108  CO2 24  --  24  GLUCOSE 85  --  86  BUN 15  --  13  CREATININE 1.50*  --  1.31*  CALCIUM 8.7*  --  8.4*  MG 1.8 1.9  --    CBC:  Recent Labs Lab 10/11/14 1205  10/13/14 0607 10/14/14 0544  WBC 7.8  < > 9.9 8.7  NEUTROABS 5.4  --   --   --   HGB 14.6  < > 12.5* 12.3*  HCT 43.6  < > 37.2* 36.5*  MCV 90.8  < > 91.2 91.5  PLT 131*  < > 122* 125*  < > = values in this interval not displayed. Cardiac Enzymes:  Recent Labs Lab 10/11/14 1903 10/11/14 2136 10/12/14 0426  TROPONINI 17.01* 16.22* 13.21*   Coagulation:  Recent Labs Lab 10/11/14 1205 10/14/14 0544  LABPROT 15.1 16.0*  INR 1.18 1.27   Urine Drug Screen: Drugs of Abuse     Component Value Date/Time   LABOPIA NONE DETECTED 10/11/2014 1724   LABOPIA NEGATIVE 12/27/2008 1943   COCAINSCRNUR NONE DETECTED 10/11/2014 1724   COCAINSCRNUR NEG 07/06/2009 2225   LABBENZ NONE DETECTED 10/11/2014 1724   LABBENZ NEG 07/06/2009 2225   AMPHETMU NONE DETECTED 10/11/2014 1724   AMPHETMU NEG 07/06/2009 2225  THCU POSITIVE* 10/11/2014 1724   LABBARB NONE DETECTED 10/11/2014 1724    Alcohol Level:  Recent Labs Lab 10/11/14 1305  ETH <5   Urinalysis:  Recent Labs Lab 10/11/14 1724  [place]  LABSPEC 1.010  PHURINE 7.0  GLUCOSEU NEGATIVE  HGBUR MODERATE*  BILIRUBINUR NEGATIVE  KETONESUR NEGATIVE  PROTEINUR NEGATIVE  UROBILINOGEN 1.0  NITRITE NEGATIVE  LEUKOCYTESUR MODERATE*   Micro Results: Recent Results (from the past 240 hour(s))  MRSA PCR Screening     Status: None   Collection Time: 10/11/14  5:29 PM  Result Value Ref Range Status   MRSA by PCR NEGATIVE NEGATIVE Final    Comment:        The GeneXpert MRSA Assay (FDA approved for NASAL specimens only), is one component of a comprehensive MRSA colonization surveillance program. It is not intended to diagnose MRSA infection  nor to guide or monitor treatment for MRSA infections.   Surgical pcr screen     Status: Abnormal   Collection Time: 10/12/14  8:51 AM  Result Value Ref Range Status   MRSA, PCR NEGATIVE NEGATIVE Final   Staphylococcus aureus POSITIVE (A) NEGATIVE Final    Comment:        The Xpert SA Assay (FDA approved for NASAL specimens in patients over 46 years of age), is one component of a comprehensive surveillance program.  Test performance has been validated by [facility] for patients greater than or equal to 35 year old. It is not intended to diagnose infection nor to guide or monitor treatment.    Studies/Results: Ct Chest W Contrast  10/13/2014   CLINICAL DATA:  69 year old male with unexplained weight loss. Patient with myocardial infarction, CVA, multiple arch O emboli. Acute on chronic kidney disease.  EXAM: CT CHEST, ABDOMEN, AND PELVIS WITH CONTRAST  TECHNIQUE: Multidetector CT imaging of the chest, abdomen and pelvis was performed following the standard protocol during bolus administration of intravenous contrast.  CONTRAST:  52mL OMNIPAQUE IOHEXOL 300 MG/ML  SOLN  COMPARISON:  06/02/2013 and prior chest CTs  FINDINGS: CT CHEST FINDINGS  Mediastinum/Nodes: Cardiomegaly again noted. Moderate coronary artery calcifications are identified. The ascending aorta measures 3.7 cm and greatest diameter. No enlarged lymph nodes are identified. There is no evidence of pericardial effusion.  Lungs/Pleura: Dependent and bibasilar atelectasis/ scarring again noted. Scarring within the mid and lower left lung again identified. Patchy ground-glass opacities within the left upper lobe may represent aspiration or for pneumonia. There is no evidence of suspicious mass or nodule. COPD/emphysema again identified. Left posterior pleural calcifications are again identified.  Musculoskeletal: No acute or suspicious abnormalities.  CT ABDOMEN AND PELVIS FINDINGS  Hepatobiliary: The liver and gallbladder are  unremarkable. There is no evidence of biliary dilatation.  Pancreas: Unremarkable  Spleen: Unremarkable  Adrenals/Urinary Tract: Moderate bilateral renal atrophy and cortical thinning noted, right greater than left. There is no evidence of hydronephrosis, renal mass or definite renal calculi.  Stomach/Bowel: There is no evidence of bowel obstruction. No definite bowel wall thickening noted. No dilated bowel loops are present. Hip replacement artifact obscures detail within the pelvis.  Vascular/Lymphatic: No enlarged lymph nodes or abdominal aortic aneurysm identified. Calcifications within the IVC, iliac and femoral veins noted.  Reproductive: Prostate not well visualized.  Other: No free fluid, abscess or pneumoperitoneum.  Musculoskeletal: Mild stranding in the left inguinal region is likely postoperative. Bilateral hip replacements identified. No acute bony abnormalities are identified. Degenerative changes in the lumbar spine and bilateral L5 pars defects again  noted.  IMPRESSION: No evidence of primary malignancy.  Patchy left upper lobe ground-glass opacities - possible aspiration versus pneumonia.  Cardiomegaly, coronary artery disease, ectatic ascending thoracic aorta, COPD/emphysema and moderate bilateral renal atrophy.   Electronically Signed   By: Margarette Canada M.D.   On: 10/13/2014 14:10   Ct Abdomen Pelvis W Contrast  10/13/2014   CLINICAL DATA:  68 year old male with unexplained weight loss. Patient with myocardial infarction, CVA, multiple arch O emboli. Acute on chronic kidney disease.  EXAM: CT CHEST, ABDOMEN, AND PELVIS WITH CONTRAST  TECHNIQUE: Multidetector CT imaging of the chest, abdomen and pelvis was performed following the standard protocol during bolus administration of intravenous contrast.  CONTRAST:  99mL OMNIPAQUE IOHEXOL 300 MG/ML  SOLN  COMPARISON:  06/02/2013 and prior chest CTs  FINDINGS: CT CHEST FINDINGS  Mediastinum/Nodes: Cardiomegaly again noted. Moderate coronary artery  calcifications are identified. The ascending aorta measures 3.7 cm and greatest diameter. No enlarged lymph nodes are identified. There is no evidence of pericardial effusion.  Lungs/Pleura: Dependent and bibasilar atelectasis/ scarring again noted. Scarring within the mid and lower left lung again identified. Patchy ground-glass opacities within the left upper lobe may represent aspiration or for pneumonia. There is no evidence of suspicious mass or nodule. COPD/emphysema again identified. Left posterior pleural calcifications are again identified.  Musculoskeletal: No acute or suspicious abnormalities.  CT ABDOMEN AND PELVIS FINDINGS  Hepatobiliary: The liver and gallbladder are unremarkable. There is no evidence of biliary dilatation.  Pancreas: Unremarkable  Spleen: Unremarkable  Adrenals/Urinary Tract: Moderate bilateral renal atrophy and cortical thinning noted, right greater than left. There is no evidence of hydronephrosis, renal mass or definite renal calculi.  Stomach/Bowel: There is no evidence of bowel obstruction. No definite bowel wall thickening noted. No dilated bowel loops are present. Hip replacement artifact obscures detail within the pelvis.  Vascular/Lymphatic: No enlarged lymph nodes or abdominal aortic aneurysm identified. Calcifications within the IVC, iliac and femoral veins noted.  Reproductive: Prostate not well visualized.  Other: No free fluid, abscess or pneumoperitoneum.  Musculoskeletal: Mild stranding in the left inguinal region is likely postoperative. Bilateral hip replacements identified. No acute bony abnormalities are identified. Degenerative changes in the lumbar spine and bilateral L5 pars defects again noted.  IMPRESSION: No evidence of primary malignancy.  Patchy left upper lobe ground-glass opacities - possible aspiration versus pneumonia.  Cardiomegaly, coronary artery disease, ectatic ascending thoracic aorta, COPD/emphysema and moderate bilateral renal atrophy.    Electronically Signed   By: Margarette Canada M.D.   On: 10/13/2014 14:10   Dg Ang/ext/uni/or Left  10/12/2014   CLINICAL DATA:  Femoral thrombectomy for embolic occlusion in the left lower extremity.  EXAM: LEFT ANG/EXT/UNI/ OR  COMPARISON:  None.  FINDINGS: Intraoperative image shows a normally patent popliteal artery and normally patent visualized anterior tibial, peroneal and posterior tibial arteries to nearly the level of the ankle. No filling defects are identified.  IMPRESSION: No distal occlusions or filling defects identified at the level of the popliteal and tibial arteries.   Electronically Signed   By: Aletta Edouard M.D.   On: 10/12/2014 14:02   Medications: I have reviewed the patient's current medications. Scheduled Meds: . aspirin EC  81 mg Oral Daily  . buPROPion  75 mg Oral BID  . diltiazem  30 mg Oral 4 times per day  . feeding supplement (ENSURE ENLIVE)  237 mL Oral TID BM  . metoprolol  75 mg Oral BID  . pantoprazole  40 mg Oral  Daily  . rosuvastatin  20 mg Oral q1800  . tamsulosin  0.4 mg Oral Daily  . tiotropium  18 mcg Inhalation Daily  . warfarin  7.5 mg Oral ONCE-1800  . Warfarin - Pharmacist Dosing Inpatient   Does not apply q1800   Continuous Infusions: . heparin 1,300 Units/hr (10/14/14 1127)   PRN Meds:.ipratropium-albuterol, metoprolol, oxyCODONE-acetaminophen, senna-docusate Assessment/Plan: Principal Problem:   STEMI (ST elevation myocardial infarction) Active Problems:   Essential hypertension   Chronic kidney disease   Chronic combined systolic and diastolic congestive heart failure   Weight loss   Atrial fibrillation, chronic   Atrial fibrillation with RVR   TIA (transient ischemic attack)   SOB (shortness of breath) [name] is a 69 y.o. M PMH A fib with RVR, COPD, CKD, HTN, poor compliance on coumadin p/w acute slurred speech, R dysarthria/numbness, trop 25.83 in ED with new ST elevation.  STEMI: Chronic A fib with RVR, poor coumadin  compliance, 81 pack-year smoking hx. No CP. PoC Troponin 25.83. ECG with STEMI. EF 30-35% with diffuse hypokinesis, previously 45-50% on 12/16/2013. No thrombus noted but TTE limited 2/2 A fib with RVR. Multiple acute arterial thrombi in pt with significant risk factors most likely thromboembolic. - Cardiology consulted, appreciate recs. - troponin downtrending: 25.83 > 20.70 > 17.01 > 16.22 > 13.21 - O2 via  for SpO2 > 94% - ASA 81 daily - metoprolol 75 mg BID per cards, 2.5 mg q5hr PRN - rosuvastatin 20 mg as atorvastatin may interact with diltiazem - Hold any ACEI for now given low BPs - Heparin gtt for ACS; Pharm managing - Diltiazem 30 mg four times daily per cardiology, from 90 mg four times daily - Continue bridging to warfarin, pharm managing  Stroke/TIA: Slurred speech, R facial droop, dysarthria. Likely thromboembolic. A fib with RVR, previous known RA thrombus, poor coumadin compliance, 81 pack-year smoking hx, HTN. All FND resolved. - Neuro consulted, appreciate recs - TTE with bubble: EF 30-35% with diffuse hypokinesis, mild AR, mild RA dilation, no emboli. Limited 2/2 A fib with RVR. No indication for TEE per cardiology. - Hgb A1c 5.6, LDL 50, carotid dopplers negative - Neuro checks - PT/OT - patient refused today, will reassess  LLE Arterial Thrombus: Acute onset of pain, pallor, pulselessness in LLE inferior to knee. Cold to touch. H/o arterial thrombus in RLE with similar presentation in 11/2013, s/p fasciotomy/thrombectomy. - POD # 2 s/p L femoral thrombectomy. No residual sx.  Failure to Thrive: Pt reports about 3 months of decreased appetite with poor PO intake. Potentially due to a combination of COPD, depression, chronic pain with opioid use. Several months of decreased appetite followed by multiple thrombi raises c/f malignancy, possible Trousseau's. Previous arterial thrombus admission on 12/10/2013. Normal colonoscopy on 10/27/2010. PSA 07/28/2014 stable at 0.49. Last CT  abd on 10/12/2012, last CT abd with contrast on 12/22/2008. Last contrast CT chest is CTA in 2010. Known pleural plaques possibly 2/2 minor asbestos exposure. - Encourage PO - Nutrition supplementation shakes - CT chest/abd/pelvis with contrast 10/13/2014: no e/o malignancy  AKI on CKD: Serum Cr up to 1.68 on admission from baseline 1.33. Possible cardiogenic prerenal state 2/2 acute STEMI in addition to likely dehydration from poor PO. Renal thrombus also possible. Unclear CKD etiology, negative w/u in 11/2009. Resolved with IVF. - Strict I/O, daily weights  Chronic Bilateral Hip Pain: - home Percocet 5-325 q4hr prn for pain  Trichomoniasis: Found incidentally on U/A - s/p 2 mg metronidazole PO x1. Will send  pt home with paper script for his partner.  HTN: Previously on metoprolol 50 mg daily, diltiazem 240 mg daily. - metoprolol, dilt as above for ACS mgmt  Transaminitis: AST 72, ALT 15 on admission. May be 2/2 to temporary hypotension in the setting of ACS. - LFTs declining 5/25: AST 57, ALT 17  COPD: baseline SOB, 81 pack-year smoking hx. PFTs 2012: FEV1/FVC 71%, FEV1 78% predicted. - Spiriva daily - O2 as above.  Depression: Continue bupropion 75 mg BID  F: None, encourage PO E: BMP WNL, replenish PRN. N: Regular diet GI: Protonix 40 mg daily  PPX: Pt on heparin gtt for ACS.  Dispo: Disposition is deferred at this time, awaiting improvement of current medical problems.  Anticipated discharge in approximately 2-3 day(s).   The patient does have a current PCP Bertha Stakes, MD) and does need an [facility] hospital follow-up appointment after discharge.  The patient does not have transportation limitations that hinder transportation to clinic appointments.  .Services Needed at time of discharge: Y = Yes, Blank = No PT:   OT:   RN:   Equipment:   Other:     LOS: 3 days   Susa Day, Med Student 10/14/2014, 11:40 AM

## 2014-10-14 NOTE — Progress Notes (Signed)
Subjective: Significantly improved. Complaining about PT trying to work with him when he had no warning. No further chest pain, SOB, dizziness, lightheadedness, nausea, or vomiting. Eating well on exam, still saying he has no appetite.   Objective: Vital signs in last 24 hours: Filed Vitals:   10/14/14 0835 10/14/14 0954 10/14/14 1126 10/14/14 1300  BP: 115/83 119/83 108/77 112/84  Pulse: 77 71  69  Temp: 98.2 F (36.8 C)   98.4 F (36.9 C)  TempSrc: Oral   Oral  Resp: 18     Height:      Weight:      SpO2: 99%   99%   Weight change: -5 lb 4.8 oz (-2.404 kg)  Intake/Output Summary (Last 24 hours) at 10/14/14 1414 Last data filed at 10/14/14 0900  Gross per 24 hour  Intake    360 ml  Output   1300 ml  Net   -940 ml   Physical Exam: General: AA male, alert, cooperative, NAD. Thin appearing.  HEENT: PERRL, EOMI. Moist mucus membranes Neck: Full range of motion without pain, supple, no lymphadenopathy or carotid bruits Lungs: Clear to ascultation bilaterally, normal work of respiration, no wheezes, rales, rhonchi Heart: Irregularly irregular, no murmurs, gallops, or rubs Abdomen: Soft, non-tender, non-distended, BS + Extremities: No cyanosis, clubbing, or edema. Left femoral dressing in place. Venous stasis changes in LE's bilaterally.  Neurologic: Alert & oriented x3, cranial nerves II-XII intact, strength grossly intact, sensation intact to light touch   Lab Results: Basic Metabolic Panel:  Recent Labs Lab 10/12/14 0426 10/13/14 0341 10/13/14 0607  NA 141  --  138  K 4.5  --  4.3  CL 107  --  108  CO2 24  --  24  GLUCOSE 85  --  86  BUN 15  --  13  CREATININE 1.50*  --  1.31*  CALCIUM 8.7*  --  8.4*  MG 1.8 1.9  --    Liver Function Tests:  Recent Labs Lab 10/11/14 1205 10/12/14 0426  AST 72* 57*  ALT 15* 17  ALKPHOS 76 66  BILITOT 1.2 1.2  PROT 7.2 5.8*  ALBUMIN 3.7 3.0*   CBC:  Recent Labs Lab 10/11/14 1205  10/13/14 0607 10/14/14 0544    WBC 7.8  < > 9.9 8.7  NEUTROABS 5.4  --   --   --   HGB 14.6  < > 12.5* 12.3*  HCT 43.6  < > 37.2* 36.5*  MCV 90.8  < > 91.2 91.5  PLT 131*  < > 122* 125*  < > = values in this interval not displayed. Cardiac Enzymes:  Recent Labs Lab 10/11/14 1903 10/11/14 2136 10/12/14 0426  TROPONINI 17.01* 16.22* 13.21*   CBG:  Recent Labs Lab 10/12/14 1314  GLUCAP 138*   Hemoglobin A1C:  Recent Labs Lab 10/11/14 1526  HGBA1C 5.6   Fasting Lipid Panel:  Recent Labs Lab 10/12/14 0426  CHOL 92  HDL 36*  LDLCALC 50  TRIG 31  CHOLHDL 2.6   Thyroid Function Tests:  Recent Labs Lab 10/11/14 2136  TSH 2.031   Coagulation:  Recent Labs Lab 10/11/14 1205 10/14/14 0544  LABPROT 15.1 16.0*  INR 1.18 1.27   Urine Drug Screen: Drugs of Abuse     Component Value Date/Time   LABOPIA NONE DETECTED 10/11/2014 1724   LABOPIA NEGATIVE 12/27/2008 1943   COCAINSCRNUR NONE DETECTED 10/11/2014 1724   COCAINSCRNUR NEG 07/06/2009 2225   LABBENZ NONE DETECTED 10/11/2014 1724  LABBENZ NEG 07/06/2009 2225   AMPHETMU NONE DETECTED 10/11/2014 1724   AMPHETMU NEG 07/06/2009 2225   THCU POSITIVE* 10/11/2014 1724   LABBARB NONE DETECTED 10/11/2014 1724    Alcohol Level:  Recent Labs Lab 10/11/14 1305  ETH <5   Urinalysis:  Recent Labs Lab 10/11/14 1724  [place]  LABSPEC 1.010  PHURINE 7.0  GLUCOSEU NEGATIVE  HGBUR MODERATE*  BILIRUBINUR NEGATIVE  KETONESUR NEGATIVE  PROTEINUR NEGATIVE  UROBILINOGEN 1.0  NITRITE NEGATIVE  LEUKOCYTESUR MODERATE*    Micro Results: Recent Results (from the past 240 hour(s))  MRSA PCR Screening     Status: None   Collection Time: 10/11/14  5:29 PM  Result Value Ref Range Status   MRSA by PCR NEGATIVE NEGATIVE Final    Comment:        The GeneXpert MRSA Assay (FDA approved for NASAL specimens only), is one component of a comprehensive MRSA colonization surveillance program. It is not intended to diagnose  MRSA infection nor to guide or monitor treatment for MRSA infections.   Surgical pcr screen     Status: Abnormal   Collection Time: 10/12/14  8:51 AM  Result Value Ref Range Status   MRSA, PCR NEGATIVE NEGATIVE Final   Staphylococcus aureus POSITIVE (A) NEGATIVE Final    Comment:        The Xpert SA Assay (FDA approved for NASAL specimens in patients over 77 years of age), is one component of a comprehensive surveillance program.  Test performance has been validated by [facility] for patients greater than or equal to 44 year old. It is not intended to diagnose infection nor to guide or monitor treatment.    Studies/Results: Ct Chest W Contrast  10/13/2014   CLINICAL DATA:  69 year old male with unexplained weight loss. Patient with myocardial infarction, CVA, multiple arch O emboli. Acute on chronic kidney disease.  EXAM: CT CHEST, ABDOMEN, AND PELVIS WITH CONTRAST  TECHNIQUE: Multidetector CT imaging of the chest, abdomen and pelvis was performed following the standard protocol during bolus administration of intravenous contrast.  CONTRAST:  66mL OMNIPAQUE IOHEXOL 300 MG/ML  SOLN  COMPARISON:  06/02/2013 and prior chest CTs  FINDINGS: CT CHEST FINDINGS  Mediastinum/Nodes: Cardiomegaly again noted. Moderate coronary artery calcifications are identified. The ascending aorta measures 3.7 cm and greatest diameter. No enlarged lymph nodes are identified. There is no evidence of pericardial effusion.  Lungs/Pleura: Dependent and bibasilar atelectasis/ scarring again noted. Scarring within the mid and lower left lung again identified. Patchy ground-glass opacities within the left upper lobe may represent aspiration or for pneumonia. There is no evidence of suspicious mass or nodule. COPD/emphysema again identified. Left posterior pleural calcifications are again identified.  Musculoskeletal: No acute or suspicious abnormalities.  CT ABDOMEN AND PELVIS FINDINGS  Hepatobiliary: The liver and  gallbladder are unremarkable. There is no evidence of biliary dilatation.  Pancreas: Unremarkable  Spleen: Unremarkable  Adrenals/Urinary Tract: Moderate bilateral renal atrophy and cortical thinning noted, right greater than left. There is no evidence of hydronephrosis, renal mass or definite renal calculi.  Stomach/Bowel: There is no evidence of bowel obstruction. No definite bowel wall thickening noted. No dilated bowel loops are present. Hip replacement artifact obscures detail within the pelvis.  Vascular/Lymphatic: No enlarged lymph nodes or abdominal aortic aneurysm identified. Calcifications within the IVC, iliac and femoral veins noted.  Reproductive: Prostate not well visualized.  Other: No free fluid, abscess or pneumoperitoneum.  Musculoskeletal: Mild stranding in the left inguinal region is likely postoperative. Bilateral hip  replacements identified. No acute bony abnormalities are identified. Degenerative changes in the lumbar spine and bilateral L5 pars defects again noted.  IMPRESSION: No evidence of primary malignancy.  Patchy left upper lobe ground-glass opacities - possible aspiration versus pneumonia.  Cardiomegaly, coronary artery disease, ectatic ascending thoracic aorta, COPD/emphysema and moderate bilateral renal atrophy.   Electronically Signed   By: Margarette Canada M.D.   On: 10/13/2014 14:10   Ct Abdomen Pelvis W Contrast  10/13/2014   CLINICAL DATA:  69 year old male with unexplained weight loss. Patient with myocardial infarction, CVA, multiple arch O emboli. Acute on chronic kidney disease.  EXAM: CT CHEST, ABDOMEN, AND PELVIS WITH CONTRAST  TECHNIQUE: Multidetector CT imaging of the chest, abdomen and pelvis was performed following the standard protocol during bolus administration of intravenous contrast.  CONTRAST:  38mL OMNIPAQUE IOHEXOL 300 MG/ML  SOLN  COMPARISON:  06/02/2013 and prior chest CTs  FINDINGS: CT CHEST FINDINGS  Mediastinum/Nodes: Cardiomegaly again noted. Moderate  coronary artery calcifications are identified. The ascending aorta measures 3.7 cm and greatest diameter. No enlarged lymph nodes are identified. There is no evidence of pericardial effusion.  Lungs/Pleura: Dependent and bibasilar atelectasis/ scarring again noted. Scarring within the mid and lower left lung again identified. Patchy ground-glass opacities within the left upper lobe may represent aspiration or for pneumonia. There is no evidence of suspicious mass or nodule. COPD/emphysema again identified. Left posterior pleural calcifications are again identified.  Musculoskeletal: No acute or suspicious abnormalities.  CT ABDOMEN AND PELVIS FINDINGS  Hepatobiliary: The liver and gallbladder are unremarkable. There is no evidence of biliary dilatation.  Pancreas: Unremarkable  Spleen: Unremarkable  Adrenals/Urinary Tract: Moderate bilateral renal atrophy and cortical thinning noted, right greater than left. There is no evidence of hydronephrosis, renal mass or definite renal calculi.  Stomach/Bowel: There is no evidence of bowel obstruction. No definite bowel wall thickening noted. No dilated bowel loops are present. Hip replacement artifact obscures detail within the pelvis.  Vascular/Lymphatic: No enlarged lymph nodes or abdominal aortic aneurysm identified. Calcifications within the IVC, iliac and femoral veins noted.  Reproductive: Prostate not well visualized.  Other: No free fluid, abscess or pneumoperitoneum.  Musculoskeletal: Mild stranding in the left inguinal region is likely postoperative. Bilateral hip replacements identified. No acute bony abnormalities are identified. Degenerative changes in the lumbar spine and bilateral L5 pars defects again noted.  IMPRESSION: No evidence of primary malignancy.  Patchy left upper lobe ground-glass opacities - possible aspiration versus pneumonia.  Cardiomegaly, coronary artery disease, ectatic ascending thoracic aorta, COPD/emphysema and moderate bilateral renal  atrophy.   Electronically Signed   By: Margarette Canada M.D.   On: 10/13/2014 14:10   Medications: I have reviewed the patient's current medications. Scheduled Meds: . aspirin EC  81 mg Oral Daily  . buPROPion  75 mg Oral BID  . diltiazem  30 mg Oral 4 times per day  . feeding supplement (ENSURE ENLIVE)  237 mL Oral TID BM  . metoprolol  75 mg Oral BID  . pantoprazole  40 mg Oral Daily  . rosuvastatin  20 mg Oral q1800  . tamsulosin  0.4 mg Oral Daily  . tiotropium  18 mcg Inhalation Daily  . warfarin  7.5 mg Oral ONCE-1800  . Warfarin - Pharmacist Dosing Inpatient   Does not apply q1800   Continuous Infusions: . heparin 1,300 Units/hr (10/14/14 1127)   PRN Meds:.ipratropium-albuterol, metoprolol, oxyCODONE-acetaminophen, senna-docusate   Assessment/Plan: 69 y/o M w/ PMHx of sick sinus syndrome s/p PPM, h/o  atrial fibrillation/flutter non-compliant w/ Coumadin, chronic thrombocytopenia, CKD, and chronic sCHF, admitted for STEMI, Atrial Fibrillation w/ RVR, and TIA symptoms, also found to have LLE arterial thrombus.   STEMI: No further chest pain. Cardiology following. ECHO 10/12/14 showed EF 30-35% and diffuse hypokinesis w/ no cardiac source of emboli.  -Continue ASA 81 mg daily -Cardizem po 30 mg q6h as below -Continue Heparin gtt until INR >2.0 for atrial fibrillation as below -Lopressor 75 mg bid -Crestor 20 mg qhs  Atrial Fibrillation w/ RVR: RVR resolved. Patient w/ known h/o atrial fibrillation/flutter w/ intermittent compliance w/ anticoagulation (Coumadin). Most likely etiology of TIA symptoms, MI, and LLE clot. Has been on Dilt gtt, now rate controlled in the 90's. Asymptomatic. This patients CHA2DS2-VASc Score and unadjusted Ischemic Stroke Rate (% per year) is equal to 9.7 % stroke rate/year from a score of 6.  -Continue Heparin gtt until INR >2.0. Started Coumadin 10/13/14 -Diltiazem 30 mg qid -Metoprolol as above  LLE Arterial Emboli: Patient w/ severe pain in the LLE  yesterday, went emergently for embolectomy. Pain resolved. Also most likely related to cardiogenic emboli given non-compliance w/ anticoagulation. Some concern for hypercoagulable state related to occult malignancy, however, CT scan of chest/abdomen/pelvis negative for occult malignancy.  -Management per [facility]; appreciate consult -Heparin, Coumadin as above  TIA/CVA: Patient initially a code stroke, symptoms resolved. Neuro following. CT head w/out acute changes, no MRI gien PPM. Carotid dopplers negative today.  -ASA -Heparin, Coumadin -Statin as above -PT/OT (refused today)  AKI: Back to baseline. May also be related to embolic event. Cr trend as follows:   Recent Labs Lab 10/11/14 1205 10/11/14 1310 10/12/14 0426 10/13/14 0607  CREATININE 1.68* 1.60* 1.50* 1.31*  -Repeat BMP in AM  Trichomonas: Given Flagyl 2 g once.  Chronic sCHF: Stable. ECHO results as above.  -BB as above -Consider ACEI when/if pressure allows.   Dispo: Disposition is deferred at this time, awaiting improvement of current medical problems.  Anticipated discharge in approximately 1-2 day(s).   The patient does have a current PCP Bertha Stakes, MD) and does need an [facility] hospital follow-up appointment after discharge.  The patient does not have transportation limitations that hinder transportation to clinic appointments.  .Services Needed at time of discharge: Y = Yes, Blank = No PT:   OT:   RN:   Equipment:   Other:     LOS: 3 days   Corky Sox, MD 10/14/2014, 2:14 PM

## 2014-10-14 NOTE — Progress Notes (Signed)
Subjective: Left leg pain; denies dyspnea or CP  Objective: Vital signs in last 24 hours: Temp:  [98.2 F (36.8 C)-99 F (37.2 C)] 98.2 F (36.8 C) (05/27 0835) Pulse Rate:  [50-77] 77 (05/27 0835) Resp:  [18-20] 18 (05/27 0835) BP: (100-115)/(66-84) 115/83 mmHg (05/27 0835) SpO2:  [98 %-99 %] 99 % (05/27 0835) Weight:  [147 lb 14.4 oz (67.087 kg)] 147 lb 14.4 oz (67.087 kg) (05/27 0608) Last BM Date: 10/09/14  Intake/Output from previous day: 05/26 0701 - 05/27 0700 In: -  Out: 1900 [Urine:1900] Intake/Output this shift:    Medications Scheduled Meds: . aspirin EC  81 mg Oral Daily  . buPROPion  75 mg Oral BID  . diltiazem  90 mg Oral 4 times per day  . feeding supplement (ENSURE ENLIVE)  237 mL Oral TID BM  . metoprolol  50 mg Oral BID  . pantoprazole  40 mg Oral Daily  . rosuvastatin  20 mg Oral q1800  . tamsulosin  0.4 mg Oral Daily  . tiotropium  18 mcg Inhalation Daily  . Warfarin - Pharmacist Dosing Inpatient   Does not apply q1800   Continuous Infusions: . heparin 1,200 Units/hr (10/13/14 2224)   PRN Meds:.ipratropium-albuterol, metoprolol, oxyCODONE-acetaminophen, senna-docusate  PE: General appearance: alert, cooperative NAD HEENT normal  Neck supple Lungs: Mildly diminished BS Heart: irregularly irregular rhythm, trchycardic Abdomen: +BS, nontender Extremities: No LEE S/p LLE surgery; 2 + DP Skin: Warm and dry Neurologic: Grossly normal  Lab Results:   Recent Labs  10/12/14 0426 10/13/14 0607 10/14/14 0544  WBC 5.9 9.9 8.7  HGB 13.4 12.5* 12.3*  HCT 40.1 37.2* 36.5*  PLT 121* 122* 125*   BMET  Recent Labs  10/11/14 1205 10/11/14 1310 10/12/14 0426 10/13/14 0607  NA 143 143 141 138  K 4.1 4.0 4.5 4.3  CL 109 107 107 108  CO2 24  --  24 24  GLUCOSE 116* 114* 85 86  BUN 17 20 15 13   CREATININE 1.68* 1.60* 1.50* 1.31*  CALCIUM 9.3  --  8.7* 8.4*   PT/INR  Recent Labs  10/11/14 1205 10/14/14 0544  LABPROT 15.1 16.0*    INR 1.18 1.27   Cholesterol  Recent Labs  10/12/14 0426  CHOL 92   Lipid Panel     Component Value Date/Time   CHOL 92 10/12/2014 0426   TRIG 31 10/12/2014 0426   TRIG 49 02/06/2010   HDL 36* 10/12/2014 0426   CHOLHDL 2.6 10/12/2014 0426   VLDL 6 10/12/2014 0426   LDLCALC 50 10/12/2014 0426   Cardiac Panel (last 3 results)  Recent Labs  10/11/14 1903 10/11/14 2136 10/12/14 0426  TROPONINI 17.01* 16.22* 13.21*     Assessment/Plan  Principal Problem:   STEMI (ST elevation myocardial infarction) Likely secondary to emboli from not taking coumadin.  This would also explain TIA and ischemic left leg.  Troponin trending down from a peak POC of 25.83(see above).  Continue IV heparin. Begin Coumadin when okay with surgery. Will not plan cath (would not want to interrupt anticoagulation given recent embolic events).  EF 30-35% with diffuse hypokinesis. Continue metoprolol and change to toprol when dose stable. Continue statin. Add ACEI later if BP allows. We'll plan nuclear study for risk stratification as outpatient once he recovers from leg surgery. Repeat echo in three months to see if LV function improves with medications.     Atrial fibrillation with RVR  Continue heparin. This will need to be continued until  INR greater than or equal to 2. I encouraged compliance and explained the risks of not taking Coumadin. Given presentation I believe his TIA, myocardial infarction and embolic event to left lower extremity all related to atrial fibrillation. Increase metoprolol to 75 mg by mouth twice a day and decrease Cardizem to 30 mg every six. I would like to treat with beta blockade alone given reduced LV function if possible. No plans for TEE    Left leg embolic event S/P left embolectomy; per vascular surgery    TIA (transient ischemic attack)  Likely embolic from atrial fibrillation    Essential hypertension  Controlled.    Chronic kidney disease, 3  Stable.  Baseline  ~1.6    Chronic combined systolic and diastolic congestive heart failure  Appears euvolemic but watch volume status with low EF  FU Dr Rayann Heman and Dr Harrington Challenger following DC    LOS: 3 days    Kirk Ruths MD

## 2014-10-14 NOTE — Consult Note (Signed)
Pt awake and alert foot feels ok 2+ left DP pulse incision clean.  Dr Donnetta Hutching will  Follow up with pt next week  Dr Bridgett Larsson on call over the weekend if questions  Ruta Hinds

## 2014-10-14 NOTE — Progress Notes (Signed)
  Date: 10/14/2014  Patient name: [name]  Medical record number: [id number]  Date of birth: 06-22-1945   This patient's plan of care was discussed with the house staff. Please see Dr. Ronnald Ramp' note for complete details. I concur with his findings.  [name] is improved today.  He is on heparin bridging to coumadin.  I think he will need to be on heparin for bridge given multiple embolic phenomena and compliance issues in the outpatient setting.     Sid Falcon, MD 10/14/2014, 4:02 PM

## 2014-10-15 DIAGNOSIS — G458 Other transient cerebral ischemic attacks and related syndromes: Secondary | ICD-10-CM

## 2014-10-15 LAB — BASIC METABOLIC PANEL
Anion gap: 9 (ref 5–15)
BUN: 14 mg/dL (ref 6–20)
CALCIUM: 8.7 mg/dL — AB (ref 8.9–10.3)
CO2: 21 mmol/L — ABNORMAL LOW (ref 22–32)
CREATININE: 1.25 mg/dL — AB (ref 0.61–1.24)
Chloride: 109 mmol/L (ref 101–111)
GFR calc Af Amer: >60 mL/min (ref >60)
GFR calc non Af Amer: 57 mL/min — ABNORMAL LOW (ref >60)
Glucose, Bld: 88 mg/dL (ref 65–99)
POTASSIUM: 4.2 mmol/L (ref 3.5–5.1)
SODIUM: 139 mmol/L (ref 135–145)

## 2014-10-15 LAB — CBC
HCT: 37.9 % — ABNORMAL LOW (ref 39.0–52.0)
Hemoglobin: 12.5 g/dL — ABNORMAL LOW (ref 13.0–17.0)
MCH: 30 pg (ref 26.0–34.0)
MCHC: 33 g/dL (ref 30.0–36.0)
MCV: 90.9 fL (ref 78.0–100.0)
Platelets: 131 10*3/uL — ABNORMAL LOW (ref 150–400)
RBC: 4.17 MIL/uL — AB (ref 4.22–5.81)
RDW: 14.3 % (ref 11.5–15.5)
WBC: 8.2 10*3/uL (ref 4.0–10.5)

## 2014-10-15 LAB — PROTIME-INR
INR: 1.4 (ref 0.00–1.49)
PROTHROMBIN TIME: 17.3 s — AB (ref 11.6–15.2)

## 2014-10-15 LAB — HEPARIN LEVEL (UNFRACTIONATED): HEPARIN UNFRACTIONATED: 0.33 [IU]/mL (ref 0.30–0.70)

## 2014-10-15 MED ORDER — RAMIPRIL 2.5 MG PO CAPS
2.5000 mg | ORAL_CAPSULE | Freq: Two times a day (BID) | ORAL | Status: DC
  Administered 2014-10-15 – 2014-10-19 (×8): 2.5 mg via ORAL
  Filled 2014-10-15 (×8): qty 1

## 2014-10-15 MED ORDER — WARFARIN SODIUM 7.5 MG PO TABS
7.5000 mg | ORAL_TABLET | Freq: Once | ORAL | Status: AC
  Administered 2014-10-15: 7.5 mg via ORAL
  Filled 2014-10-15: qty 1

## 2014-10-15 MED ORDER — HEPARIN (PORCINE) IN NACL 100-0.45 UNIT/ML-% IJ SOLN
1350.0000 [IU]/h | INTRAMUSCULAR | Status: DC
  Administered 2014-10-15 – 2014-10-17 (×4): 1350 [IU]/h via INTRAVENOUS
  Filled 2014-10-15 (×4): qty 250

## 2014-10-15 NOTE — Progress Notes (Signed)
Subjective: No complaints today. Eating well.   Objective: Vital signs in last 24 hours: Filed Vitals:   10/15/14 0021 10/15/14 0400 10/15/14 0741 10/15/14 0841  BP: 106/63 139/87  127/78  Pulse: 79 86  78  Temp: 99.1 F (37.3 C) 98.8 F (37.1 C)  98.7 F (37.1 C)  TempSrc:  Oral  Oral  Resp: 16 13    Height:      Weight:  146 lb 12.8 oz (66.588 kg)    SpO2: 96% 98% 96% 100%   Weight change: -1 lb 1.6 oz (-0.499 kg)  Intake/Output Summary (Last 24 hours) at 10/15/14 1031 Last data filed at 10/15/14 0710  Gross per 24 hour  Intake    720 ml  Output   1400 ml  Net   -680 ml   Physical Exam: General: AA male, alert, cooperative, NAD.  HEENT: PERRL, EOMI. Moist mucus membranes Neck: Full range of motion without pain, supple, no lymphadenopathy or carotid bruits Lungs: Clear to ascultation bilaterally, normal work of respiration, no wheezes, rales, rhonchi Heart: Irregularly irregular, no murmurs, gallops, or rubs Abdomen: Soft, non-tender, non-distended, BS + Extremities: No cyanosis, clubbing, or edema. Venous stasis changes in LE's bilaterally.  Neurologic: Alert & oriented x3, cranial nerves II-XII intact, strength grossly intact, sensation intact to light touch   Lab Results: Basic Metabolic Panel:  Recent Labs Lab 10/12/14 0426 10/13/14 0341 10/13/14 0607 10/15/14 0355  NA 141  --  138 139  K 4.5  --  4.3 4.2  CL 107  --  108 109  CO2 24  --  24 21*  GLUCOSE 85  --  86 88  BUN 15  --  13 14  CREATININE 1.50*  --  1.31* 1.25*  CALCIUM 8.7*  --  8.4* 8.7*  MG 1.8 1.9  --   --    Liver Function Tests:  Recent Labs Lab 10/11/14 1205 10/12/14 0426  AST 72* 57*  ALT 15* 17  ALKPHOS 76 66  BILITOT 1.2 1.2  PROT 7.2 5.8*  ALBUMIN 3.7 3.0*   CBC:  Recent Labs Lab 10/11/14 1205  10/14/14 0544 10/15/14 0355  WBC 7.8  < > 8.7 8.2  NEUTROABS 5.4  --   --   --   HGB 14.6  < > 12.3* 12.5*  HCT 43.6  < > 36.5* 37.9*  MCV 90.8  < > 91.5 90.9  PLT  131*  < > 125* 131*  < > = values in this interval not displayed. Cardiac Enzymes:  Recent Labs Lab 10/11/14 1903 10/11/14 2136 10/12/14 0426  TROPONINI 17.01* 16.22* 13.21*   CBG:  Recent Labs Lab 10/12/14 1314  GLUCAP 138*   Hemoglobin A1C:  Recent Labs Lab 10/11/14 1526  HGBA1C 5.6   Fasting Lipid Panel:  Recent Labs Lab 10/12/14 0426  CHOL 92  HDL 36*  LDLCALC 50  TRIG 31  CHOLHDL 2.6   Thyroid Function Tests:  Recent Labs Lab 10/11/14 2136  TSH 2.031   Coagulation:  Recent Labs Lab 10/11/14 1205 10/14/14 0544 10/15/14 0355  LABPROT 15.1 16.0* 17.3*  INR 1.18 1.27 1.40   Urine Drug Screen: Drugs of Abuse     Component Value Date/Time   LABOPIA NONE DETECTED 10/11/2014 1724   LABOPIA NEGATIVE 12/27/2008 1943   COCAINSCRNUR NONE DETECTED 10/11/2014 1724   COCAINSCRNUR NEG 07/06/2009 2225   LABBENZ NONE DETECTED 10/11/2014 1724   LABBENZ NEG 07/06/2009 2225   AMPHETMU NONE DETECTED 10/11/2014 1724  AMPHETMU NEG 07/06/2009 2225   THCU POSITIVE* 10/11/2014 1724   LABBARB NONE DETECTED 10/11/2014 1724    Alcohol Level:  Recent Labs Lab 10/11/14 1305  ETH <5   Urinalysis:  Recent Labs Lab 10/11/14 1724  COLORURINE YELLOW  LABSPEC 1.010  PHURINE 7.0  GLUCOSEU NEGATIVE  HGBUR MODERATE*  BILIRUBINUR NEGATIVE  KETONESUR NEGATIVE  PROTEINUR NEGATIVE  UROBILINOGEN 1.0  NITRITE NEGATIVE  LEUKOCYTESUR MODERATE*    Micro Results: Recent Results (from the past 240 hour(s))  MRSA PCR Screening     Status: None   Collection Time: 10/11/14  5:29 PM  Result Value Ref Range Status   MRSA by PCR NEGATIVE NEGATIVE Final    Comment:        The GeneXpert MRSA Assay (FDA approved for NASAL specimens only), is one component of a comprehensive MRSA colonization surveillance program. It is not intended to diagnose MRSA infection nor to guide or monitor treatment for MRSA infections.   Surgical pcr screen     Status: Abnormal    Collection Time: 10/12/14  8:51 AM  Result Value Ref Range Status   MRSA, PCR NEGATIVE NEGATIVE Final   Staphylococcus aureus POSITIVE (A) NEGATIVE Final    Comment:        The Xpert SA Assay (FDA approved for NASAL specimens in patients over 74 years of age), is one component of a comprehensive surveillance program.  Test performance has been validated by [facility] for patients greater than or equal to 54 year old. It is not intended to diagnose infection nor to guide or monitor treatment.    Studies/Results: Ct Chest W Contrast  10/13/2014   CLINICAL DATA:  69 year old male with unexplained weight loss. Patient with myocardial infarction, CVA, multiple arch O emboli. Acute on chronic kidney disease.  EXAM: CT CHEST, ABDOMEN, AND PELVIS WITH CONTRAST  TECHNIQUE: Multidetector CT imaging of the chest, abdomen and pelvis was performed following the standard protocol during bolus administration of intravenous contrast.  CONTRAST:  88mL OMNIPAQUE IOHEXOL 300 MG/ML  SOLN  COMPARISON:  06/02/2013 and prior chest CTs  FINDINGS: CT CHEST FINDINGS  Mediastinum/Nodes: Cardiomegaly again noted. Moderate coronary artery calcifications are identified. The ascending aorta measures 3.7 cm and greatest diameter. No enlarged lymph nodes are identified. There is no evidence of pericardial effusion.  Lungs/Pleura: Dependent and bibasilar atelectasis/ scarring again noted. Scarring within the mid and lower left lung again identified. Patchy ground-glass opacities within the left upper lobe may represent aspiration or for pneumonia. There is no evidence of suspicious mass or nodule. COPD/emphysema again identified. Left posterior pleural calcifications are again identified.  Musculoskeletal: No acute or suspicious abnormalities.  CT ABDOMEN AND PELVIS FINDINGS  Hepatobiliary: The liver and gallbladder are unremarkable. There is no evidence of biliary dilatation.  Pancreas: Unremarkable  Spleen: Unremarkable   Adrenals/Urinary Tract: Moderate bilateral renal atrophy and cortical thinning noted, right greater than left. There is no evidence of hydronephrosis, renal mass or definite renal calculi.  Stomach/Bowel: There is no evidence of bowel obstruction. No definite bowel wall thickening noted. No dilated bowel loops are present. Hip replacement artifact obscures detail within the pelvis.  Vascular/Lymphatic: No enlarged lymph nodes or abdominal aortic aneurysm identified. Calcifications within the IVC, iliac and femoral veins noted.  Reproductive: Prostate not well visualized.  Other: No free fluid, abscess or pneumoperitoneum.  Musculoskeletal: Mild stranding in the left inguinal region is likely postoperative. Bilateral hip replacements identified. No acute bony abnormalities are identified. Degenerative changes in the lumbar  spine and bilateral L5 pars defects again noted.  IMPRESSION: No evidence of primary malignancy.  Patchy left upper lobe ground-glass opacities - possible aspiration versus pneumonia.  Cardiomegaly, coronary artery disease, ectatic ascending thoracic aorta, COPD/emphysema and moderate bilateral renal atrophy.   Electronically Signed   By: Margarette Canada M.D.   On: 10/13/2014 14:10   Ct Abdomen Pelvis W Contrast  10/13/2014   CLINICAL DATA:  69 year old male with unexplained weight loss. Patient with myocardial infarction, CVA, multiple arch O emboli. Acute on chronic kidney disease.  EXAM: CT CHEST, ABDOMEN, AND PELVIS WITH CONTRAST  TECHNIQUE: Multidetector CT imaging of the chest, abdomen and pelvis was performed following the standard protocol during bolus administration of intravenous contrast.  CONTRAST:  40mL OMNIPAQUE IOHEXOL 300 MG/ML  SOLN  COMPARISON:  06/02/2013 and prior chest CTs  FINDINGS: CT CHEST FINDINGS  Mediastinum/Nodes: Cardiomegaly again noted. Moderate coronary artery calcifications are identified. The ascending aorta measures 3.7 cm and greatest diameter. No enlarged lymph  nodes are identified. There is no evidence of pericardial effusion.  Lungs/Pleura: Dependent and bibasilar atelectasis/ scarring again noted. Scarring within the mid and lower left lung again identified. Patchy ground-glass opacities within the left upper lobe may represent aspiration or for pneumonia. There is no evidence of suspicious mass or nodule. COPD/emphysema again identified. Left posterior pleural calcifications are again identified.  Musculoskeletal: No acute or suspicious abnormalities.  CT ABDOMEN AND PELVIS FINDINGS  Hepatobiliary: The liver and gallbladder are unremarkable. There is no evidence of biliary dilatation.  Pancreas: Unremarkable  Spleen: Unremarkable  Adrenals/Urinary Tract: Moderate bilateral renal atrophy and cortical thinning noted, right greater than left. There is no evidence of hydronephrosis, renal mass or definite renal calculi.  Stomach/Bowel: There is no evidence of bowel obstruction. No definite bowel wall thickening noted. No dilated bowel loops are present. Hip replacement artifact obscures detail within the pelvis.  Vascular/Lymphatic: No enlarged lymph nodes or abdominal aortic aneurysm identified. Calcifications within the IVC, iliac and femoral veins noted.  Reproductive: Prostate not well visualized.  Other: No free fluid, abscess or pneumoperitoneum.  Musculoskeletal: Mild stranding in the left inguinal region is likely postoperative. Bilateral hip replacements identified. No acute bony abnormalities are identified. Degenerative changes in the lumbar spine and bilateral L5 pars defects again noted.  IMPRESSION: No evidence of primary malignancy.  Patchy left upper lobe ground-glass opacities - possible aspiration versus pneumonia.  Cardiomegaly, coronary artery disease, ectatic ascending thoracic aorta, COPD/emphysema and moderate bilateral renal atrophy.   Electronically Signed   By: Margarette Canada M.D.   On: 10/13/2014 14:10   Medications: I have reviewed the patient's  current medications. Scheduled Meds: . aspirin EC  81 mg Oral Daily  . buPROPion  75 mg Oral BID  . diltiazem  30 mg Oral 4 times per day  . feeding supplement (ENSURE ENLIVE)  237 mL Oral TID BM  . metoprolol  75 mg Oral BID  . pantoprazole  40 mg Oral Daily  . rosuvastatin  20 mg Oral q1800  . tamsulosin  0.4 mg Oral Daily  . tiotropium  18 mcg Inhalation Daily  . warfarin  7.5 mg Oral ONCE-1800  . Warfarin - Pharmacist Dosing Inpatient   Does not apply q1800   Continuous Infusions: . heparin 1,350 Units/hr (10/15/14 0829)   PRN Meds:.ipratropium-albuterol, metoprolol, oxyCODONE-acetaminophen, senna-docusate   Assessment/Plan: 69 y/o M w/ PMHx of sick sinus syndrome s/p PPM, h/o atrial fibrillation/flutter non-compliant w/ Coumadin, chronic thrombocytopenia, CKD, and chronic sCHF, admitted for  STEMI, Atrial Fibrillation w/ RVR, and TIA symptoms, also found to have LLE arterial thrombus.   CAD: ST elevation on admission. No further chest pain. Cardiology following. ECHO 10/12/14 showed EF 30-35% and diffuse hypokinesis w/ no cardiac source of emboli.  -Continue ASA 81 mg daily -Cardizem po 30 mg q6h as below -Continue Heparin gtt until INR >2.0 for atrial fibrillation as below -Lopressor 75 mg bid -Crestor 20 mg qhs  Atrial Fibrillation w/ RVR: RVR resolved. Patient w/ known h/o atrial fibrillation/flutter w/ intermittent compliance w/ anticoagulation (Coumadin). Most likely etiology of TIA symptoms, MI, and LLE clot. Has been on Dilt gtt, now rate controlled in the 90's. Asymptomatic. This patients CHA2DS2-VASc Score and unadjusted Ischemic Stroke Rate (% per year) is equal to 9.7 % stroke rate/year from a score of 6.  -Continue Heparin gtt until INR >2.0. Started Coumadin 10/13/14 -Diltiazem 30 mg qid -Metoprolol as above  LLE Arterial Emboli: Patient w/ severe pain in the LLE on 10/12/14, went emergently for embolectomy. Pain resolved. Also most likely related to cardiogenic  emboli given non-compliance w/ anticoagulation.  -Management per VVS; appreciate consult -Heparin, Coumadin as above  TIA/CVA: No further neuro symptoms -ASA -Heparin, Coumadin -Statin as above -PT/OT  AKI: Back to baseline. May also be related to embolic event. Cr trend as follows:   Recent Labs Lab 10/11/14 1205 10/11/14 1310 10/12/14 0426 10/13/14 0607 10/15/14 0355  CREATININE 1.68* 1.60* 1.50* 1.31* 1.25*  -Repeat BMP in AM  Chronic sCHF: Stable. ECHO results as above.  -BB as above -Consider ACEI when/if pressure allows.   Dispo: Disposition is deferred at this time, awaiting improvement of current medical problems.  Anticipated discharge in approximately 1-2 day(s).   The patient does have a current PCP Bertha Stakes, MD) and does need an [facility] hospital follow-up appointment after discharge.  The patient does not have transportation limitations that hinder transportation to clinic appointments.  .Services Needed at time of discharge: Y = Yes, Blank = No PT:   OT:   RN:   Equipment:   Other:     LOS: 4 days   Corky Sox, MD 10/15/2014, 10:31 AM

## 2014-10-15 NOTE — Progress Notes (Addendum)
ANTICOAGULATION CONSULT NOTE - Follow Up Consult  Pharmacy Consult for Heparin, warfarin Indication: chest pain/ACS, a fib, VTE   Allergies  Allergen Reactions  . Digoxin And Related Other (See Comments)    Dig toxicity 2009  . Penicillins Rash    Patient Measurements: Height: _0  (190.5 cm) Weight: 146 lb 12.8 oz (66.588 kg) IBW/kg (Calculated) : 84.5  Heparin Dosing Weight: 70 kg  Vital Signs: Temp: 98.8 F (37.1 C) (05/28 0400) Temp Source: Oral (05/28 0400) BP: 139/87 mmHg (05/28 0400) Pulse Rate: 86 (05/28 0400)  Labs:  Recent Labs  10/13/14 0607  10/14/14 0544 10/14/14 1913 10/15/14 0355  HGB 12.5*  --  12.3*  --  12.5*  HCT 37.2*  --  36.5*  --  37.9*  PLT 122*  --  125*  --  131*  LABPROT  --   --  16.0*  --  17.3*  INR  --   --  1.27  --  1.40  HEPARINUNFRC 0.34  < > 0.31 0.49 0.33  CREATININE 1.31*  --   --   --  1.25*  < > = values in this interval not displayed.  Estimated Creatinine Clearance: 51.8 mL/min (by C-G formula based on Cr of 1.25).   Medical History: Past Medical History  Diagnosis Date  . Hypertension   . Chronic systolic heart failure     a. NICM - EF 35% with normal cors 2003. b. Last echo 11/2012 - EF 25-30%.  . Depression   . GERD (gastroesophageal reflux disease)   . Portal vein thrombosis   . Avascular necrosis of bones of both hips     Status post bilateral hip replacements  . Gastritis   . Alcohol abuse   . Erectile dysfunction   . Bipolar disorder   . Hydrocele, unspecified   . Intermittent claudication   . Lung nodule     Chest CT scan on 12/14/2008 showed a nodular opacity at the left lung base felt to most likely represent scarring.  Follow-up chest CT scan on 06/02/2010 showed parenchymal scarring in the left apex, left  lower lobe, and lingula; some of this scarring at the left base had a nodular appearance, unchanged. Chest 09/2012 - stable.  . Hyperthyroidism     Likely due to thyroiditis with possible  amiodarone association.  Thyroid scan 08/28/2009 was normal with no focal areas of abnormal increased or decreased activity seen; the uptake of I 131 sodium iodide at 24 hours was 5.7%.  TSH and free T4 normalized by 08/16/2009.  . Intestinal obstruction   . COPD (chronic obstructive pulmonary disease)   . Clostridium difficile colitis   . E coli bacteremia   . DVT (deep venous thrombosis)     He has hypercoagulability with multiple prior DVTs  . Pleural plaque     H/o asbestos exposure. Chest CT on 06/02/2010 showed stable extensive calcified pleural plaques involving the left hemithorax, consistent with asbestos related pleural disease.  . Weight loss     Normal colonoscopy by Dr. Olevia Perches on 11/06/2010.  . Tachycardia-bradycardia syndrome     a. 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 b. St. Jude gen change 2010.  [name] PAF (paroxysmal atrial fibrillation)     Paroxysmal, failed medical therapy with amiodarone but has done very well with multaq;  d/c 2/2 to recurrent AFib;  Tikosyn load c/b Hyperkalemia during admx 05/2013 => rate control strategy  . 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  . Noncompliance   . Atrial flutter   . Thrombocytopenia   . Hyperkalemia     type 4 RTA  . Pacemaker   . Shortness of breath     "all the time now" (07/02/2013)  . On home oxygen therapy     "3L prn; need it maybe 3-4 times/wk" (10/11/2014)  . Pneumonia   . Cluster headaches     "~ twice/wk" (07/02/2013)  . Osteoarthritis   . Spondylosis   . Arthritis     "hips and legs" (07/02/2013)  . Chronic back pain     "all over my back" (07/02/2013)  . Anxiety   . CKD (chronic kidney disease)     Renal U/S 12/04/2009 showed no pathological findings. Labs 12/04/2009 include normal ESR, C3, C4; neg ANA; SPEP showed nonspecific increase in the alpha-2 region with no M-spike; UPEP showed no monoclonal free light chains;  urine IFE showed polyclonal increase in feree Kappa and/or free Lambda light chains. Baseline Cr reported 1.7-2.5.  . STEMI (ST elevation myocardial infarction) 10/11/2014  . CHF (congestive heart failure)     Medications:  Heparin infusion at 1300 units/hr  Assessment: 69 y.o. male on Coumadin PTA (noncompliant) for h/o DVT and PAF presented with new LLE arterial thrombus s/p L femoral thrombectomy on IV heparin. Heparin level this AM is therapeutic at lower end of goal at 0.33 and has trended down slightly. Per nurse, no interruptions in gtt and no bleeding noted. INR remains SUBtherapeutic but has started to trend up to 1.40.  H/H and Plt are low but stable.   Patient reports non-compliance to warfarin PTA, but reported was supposed to be on alternating doses of 5 mg and 7.5 mg (last outpatient coumadin note in 04/2014 stated dose was 7.5 mg daily and INR was therapeutic at that visit)  Goal of Therapy:  Heparin level 0.3-0.7 units/ml  INR 2-3 Monitor platelets by anticoagulation protocol: Yes   Plan:  Increase heparin to 1350 units/hr to maintain in goal range Repeat Coumadin 7.2m PO x 1 tonight Continue daily heparin level, CBC, and INR  Jewelia Bocchino K. NVelva Harman PharmD, BDeltaClinical Pharmacist - Resident Pager: [phone]: [phone]/28/2016 8:32 AM

## 2014-10-15 NOTE — Evaluation (Signed)
Physical Therapy Evaluation Patient Details Name: [name] MRN: [id number] DOB: [birth year] Today's Date: 10/15/2014   History of Present Illness  Pt adm with TIA and STEMI. Pt developed LLE emboli and required a thrombectomy on 5/25.  PMH - RLE embolectomy and fasiciotomies, pacer, COPD, bipolar,bil THA  Clinical Impression  Pt admitted with above diagnosis and presents to PT with functional limitations due to deficits listed below (See PT problem list). Pt needs skilled PT to maximize independence and safety to allow discharge to home. Pt needs encouragement to participate.      Follow Up Recommendations Home health PT;Supervision - Intermittent    Equipment Recommendations  None recommended by PT    Recommendations for Other Services       Precautions / Restrictions Precautions Precautions: Fall      Mobility  Bed Mobility Overal bed mobility: Modified Independent                Transfers Overall transfer level: Needs assistance Equipment used: Rolling walker (2 wheeled) Transfers: Sit to/from Stand Sit to Stand: Min guard         General transfer comment: Assist for safety  Ambulation/Gait Ambulation/Gait assistance: Min guard Ambulation Distance (Feet): 2 Feet Assistive device: Rolling walker (2 wheeled) Gait Pattern/deviations: Step-to pattern;Decreased step length - right;Decreased stance time - left;Decreased step length - left Gait velocity: slow Gait velocity interpretation: Below normal speed for age/gender General Gait Details: Pt only took step over to recliner due to pain.  Stairs            Wheelchair Mobility    Modified Rankin (Stroke Patients Only) Modified Rankin (Stroke Patients Only) Pre-Morbid Rankin Score: No significant disability Modified Rankin: Moderately severe disability     Balance Overall balance assessment: Needs assistance Sitting-balance support: No upper extremity supported;Feet supported Sitting  balance-Leahy Scale: Normal     Standing balance support: Bilateral upper extremity supported Standing balance-Leahy Scale: Poor Standing balance comment: support of walker due to painful RLE                             Pertinent Vitals/Pain Pain Assessment: Faces Faces Pain Scale: Hurts even more Pain Location: LLE Pain Intervention(s): Limited activity within patient's tolerance;Repositioned;RN gave pain meds during session    Home Living Family/patient expects to be discharged to:: Private residence Living Arrangements: Children Available Help at Discharge: Other (Comment) Type of Home: House Home Access: Stairs to enter Entrance Stairs-Rails: Can reach both;Left;Right Entrance Stairs-Number of Steps: 3 Home Layout: One level Home Equipment: [name] - 2 wheels      Prior Function Level of Independence: Independent with assistive device(s)         Comments: taxi driver     Hand Dominance   Dominant Hand: Right    Extremity/Trunk Assessment   Upper Extremity Assessment: Defer to OT evaluation           Lower Extremity Assessment: LLE deficits/detail   LLE Deficits / Details: limited by pain     Communication   Communication: No difficulties  Cognition Arousal/Alertness: Awake/alert Behavior During Therapy: WFL for tasks assessed/performed Overall Cognitive Status: Within Functional Limits for tasks assessed                      General Comments      Exercises        Assessment/Plan    PT Assessment Patient needs continued PT  services  PT Diagnosis Difficulty walking;Acute pain   PT Problem List Decreased activity tolerance;Decreased balance;Decreased mobility;Decreased knowledge of use of DME;Pain  PT Treatment Interventions DME instruction;Gait training;Functional mobility training;Therapeutic activities;Therapeutic exercise;Balance training;Patient/family education   PT Goals (Current goals can be found in  the Care Plan section) Acute Rehab PT Goals Patient Stated Goal: return home PT Goal Formulation: With patient Time For Goal Achievement: 10/22/14 Potential to Achieve Goals: Good    Frequency Min 3X/week   Barriers to discharge        Co-evaluation               End of Session   Activity Tolerance: Patient limited by pain Patient left: in chair;with call bell/phone within reach;with chair alarm set Nurse Communication: Mobility status         Time: 6314-9702 PT Time Calculation (min) (ACUTE ONLY): 22 min   Charges:   PT Evaluation $Initial PT Evaluation Tier I: 1 Procedure     PT G Codes:        Zebulen Simonis 10-25-14, 3:01 PM  [facility] PT [phone]

## 2014-10-15 NOTE — Progress Notes (Signed)
Patient Name: [name]      SUBJECTIVE: 69 year old male admitted with a TIA. Courses also been complicated by left lower extremity embolectomy as well as an MI with the thinking being that all of these came for atrial fibrillation in the context of noncompliance with anticoagulation. He has a history of hypertension and chronic kidney disease as well as congestive heart failure. Echo 5/16 demonstrated EF 30-35%<< 45-50% 7/15  He has no complaints of shortness of breath or chest pain today. He has been lying in bed. He has not been walking.  He has a history of a previously implanted [employer]. Jude pacemaker. Apparently there was a lead failure as there is an abandoned ventricular lead.  Generator replacement 2010.  Past Medical History  Diagnosis Date  . Hypertension   . Chronic systolic heart failure     a. NICM - EF 35% with normal cors 2003. b. Last echo 11/2012 - EF 25-30%.  . Depression   . GERD (gastroesophageal reflux disease)   . Portal vein thrombosis   . Avascular necrosis of bones of both hips     Status post bilateral hip replacements  . Gastritis   . Alcohol abuse   . Erectile dysfunction   . Bipolar disorder   . Hydrocele, unspecified   . Intermittent claudication   . Lung nodule     Chest CT scan on 12/14/2008 showed a nodular opacity at the left lung base felt to most likely represent scarring.  Follow-up chest CT scan on 06/02/2010 showed parenchymal scarring in the left apex, left  lower lobe, and lingula; some of this scarring at the left base had a nodular appearance, unchanged. Chest 09/2012 - stable.  . 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  Thyroid scan 08/28/2009 was normal with no focal areas of abnormal increased or decreased activity seen; the uptake of I 131 sodium iodide at 24 hours was 5.7%.  TSH and free T4 normalized by 08/16/2009.  . Intestinal obstruction   . COPD (chronic obstructive pulmonary  disease)   . Clostridium difficile colitis   . E coli bacteremia   . DVT (deep venous thrombosis)     He has hypercoagulability with multiple prior DVTs  . Pleural plaque     H/o asbestos exposure. Chest CT on 06/02/2010 showed stable extensive calcified pleural plaques involving the left hemithorax, consistent with asbestos related pleural disease.  . Weight loss     Normal colonoscopy by Dr. Olevia Perches on 11/06/2010.  . Tachycardia-bradycardia syndrome     a. 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 b. St. Jude gen change 2010.  [name] PAF (paroxysmal atrial fibrillation)     Paroxysmal, failed medical therapy with amiodarone but has done very well with multaq;  d/c 2/2 to recurrent AFib;  Tikosyn load c/b Hyperkalemia during admx 05/2013 => rate control strategy  . 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  . Noncompliance   . Atrial flutter   . Thrombocytopenia   . Hyperkalemia     type 4 RTA  . Pacemaker   . Shortness of breath     "all the time now" (07/02/2013)  . On home oxygen therapy     "3L prn; need it maybe 3-4 times/wk" (10/11/2014)  . Pneumonia   . Cluster headaches     "~ twice/wk" (07/02/2013)  .  Osteoarthritis   . Spondylosis   . Arthritis     "hips and legs" (07/02/2013)  . Chronic back pain     "all over my back" (07/02/2013)  . Anxiety   . CKD (chronic kidney disease)     Renal U/S 12/04/2009 showed no pathological findings. Labs 12/04/2009 include normal ESR, C3, C4; neg ANA; SPEP showed nonspecific increase in the alpha-2 region with no M-spike; UPEP showed no monoclonal free light chains; urine IFE showed polyclonal increase in feree Kappa and/or free Lambda light chains. Baseline Cr reported 1.7-2.5.  . STEMI (ST elevation myocardial infarction) 10/11/2014  . CHF (congestive heart failure)     Scheduled Meds:  Scheduled Meds: . aspirin EC  81 mg Oral Daily  . buPROPion   75 mg Oral BID  . diltiazem  30 mg Oral 4 times per day  . feeding supplement (ENSURE ENLIVE)  237 mL Oral TID BM  . metoprolol  75 mg Oral BID  . pantoprazole  40 mg Oral Daily  . rosuvastatin  20 mg Oral q1800  . tamsulosin  0.4 mg Oral Daily  . tiotropium  18 mcg Inhalation Daily  . warfarin  7.5 mg Oral ONCE-1800  . Warfarin - Pharmacist Dosing Inpatient   Does not apply q1800   Continuous Infusions: . heparin 1,350 Units/hr (10/15/14 0829)   ipratropium-albuterol, metoprolol, oxyCODONE-acetaminophen, senna-docusate    PHYSICAL EXAM Filed Vitals:   10/15/14 0400 10/15/14 0741 10/15/14 0841 10/15/14 1102  BP: 139/87  127/78 120/84  Pulse: 86  78 83  Temp: 98.8 F (37.1 C)  98.7 F (37.1 C) 98.6 F (37 C)  TempSrc: Oral  Oral Oral  Resp: 13   18  Height:      Weight: 66.588 kg (146 lb 12.8 oz)     SpO2: 98% 96% 100% 100%   Well developed and nourished in no acute distress HENT normal Neck supple with JVP-flat Carotids brisk and full without bruits Course breath sounds Irregularly irregular rate and rhythm with controlled  ventricular response, no murmurs or gallops Abd-soft with active BS without hepatomegaly No Clubbing cyanosis edema Skin-warm and dry A & Oriented  Grossly normal sensory and motor function   TELEMETRY: Reviewed telemetry pt in afib with an occasional paced beat.    Intake/Output Summary (Last 24 hours) at 10/15/14 1248 Last data filed at 10/15/14 0710  Gross per 24 hour  Intake    720 ml  Output   1150 ml  Net   -430 ml    LABS: Basic Metabolic Panel:  Recent Labs Lab 10/11/14 1205 10/11/14 1310  10/12/14 0426 10/13/14 0341 10/13/14 0607 10/15/14 0355  NA 143 143  --  141  --  138 139  K 4.1 4.0  --  4.5  --  4.3 4.2  CL 109 107  --  107  --  108 109  CO2 24  --   --  24  --  24 21*  GLUCOSE 116* 114*  --  85  --  86 88  BUN 17 20  --  15  --  13 14  CREATININE 1.68* 1.60*  --  1.50*  --  1.31* 1.25*  CALCIUM 9.3  --   --   8.7*  --  8.4* 8.7*  MG  --   --   < > 1.8 1.9  --   --   < > = values in this interval not displayed. Cardiac Enzymes: No results for input(s): CKTOTAL,  CKMB, CKMBINDEX, TROPONINI in the last 72 hours. CBC:  Recent Labs Lab 10/11/14 1205 10/11/14 1310 10/12/14 0426 10/13/14 0607 10/14/14 0544 10/15/14 0355  WBC 7.8  --  5.9 9.9 8.7 8.2  NEUTROABS 5.4  --   --   --   --   --   HGB 14.6 16.7 13.4 12.5* 12.3* 12.5*  HCT 43.6 49.0 40.1 37.2* 36.5* 37.9*  MCV 90.8  --  91.3 91.2 91.5 90.9  PLT 131*  --  121* 122* 125* 131*   PROTIME:  Recent Labs  10/14/14 0544 10/15/14 0355  LABPROT 16.0* 17.3*  INR 1.27 1.40   Liver Function Tests: No results for input(s): AST, ALT, ALKPHOS, BILITOT, PROT, ALBUMIN in the last 72 hours. No results for input(s): LIPASE, AMYLASE in the last 72 hours. BNP: BNP (last 3 results) No results for input(s): BNP in the last 8760 hours.  ProBNP (last 3 results)  Recent Labs  12/10/13 1810  PROBNP 6010.0*       ASSESSMENT AND PLAN:  Principal Problem:   STEMI (ST elevation myocardial infarction) Active Problems:   Essential hypertension   Chronic kidney disease   Chronic combined systolic and diastolic congestive heart failure   Weight loss   Atrial fibrillation, chronic   Atrial fibrillation with RVR   TIA (transient ischemic attack)   SOB (shortness of breath)  We'll stop diltiazem. Add ACE inhibitor. Reiterated the importance of Coumadin. He is articulate himself the understanding that is lifelong best friend Hopefully there'll be interval improvement in LV systolic function Continue heparin until his Coumadin is therapeutic-anticipate 24 hour overlap   Signed, Virl Axe MD  10/15/2014

## 2014-10-16 DIAGNOSIS — R6881 Early satiety: Secondary | ICD-10-CM

## 2014-10-16 LAB — BASIC METABOLIC PANEL
Anion gap: 6 (ref 5–15)
BUN: 14 mg/dL (ref 6–20)
CHLORIDE: 108 mmol/L (ref 101–111)
CO2: 26 mmol/L (ref 22–32)
Calcium: 9.3 mg/dL (ref 8.9–10.3)
Creatinine, Ser: 1.31 mg/dL — ABNORMAL HIGH (ref 0.61–1.24)
GFR calc Af Amer: >60 mL/min (ref >60)
GFR calc non Af Amer: 54 mL/min — ABNORMAL LOW (ref >60)
Glucose, Bld: 93 mg/dL (ref 65–99)
POTASSIUM: 4.1 mmol/L (ref 3.5–5.1)
SODIUM: 140 mmol/L (ref 135–145)

## 2014-10-16 LAB — CBC
HEMATOCRIT: 39.8 % (ref 39.0–52.0)
Hemoglobin: 13.3 g/dL (ref 13.0–17.0)
MCH: 30.2 pg (ref 26.0–34.0)
MCHC: 33.4 g/dL (ref 30.0–36.0)
MCV: 90.2 fL (ref 78.0–100.0)
Platelets: 162 10*3/uL (ref 150–400)
RBC: 4.41 MIL/uL (ref 4.22–5.81)
RDW: 14.1 % (ref 11.5–15.5)
WBC: 7.8 10*3/uL (ref 4.0–10.5)

## 2014-10-16 LAB — PROTIME-INR
INR: 1.62 — ABNORMAL HIGH (ref 0.00–1.49)
Prothrombin Time: 19.3 seconds — ABNORMAL HIGH (ref 11.6–15.2)

## 2014-10-16 LAB — HEPARIN LEVEL (UNFRACTIONATED): HEPARIN UNFRACTIONATED: 0.44 [IU]/mL (ref 0.30–0.70)

## 2014-10-16 MED ORDER — DIGOXIN 125 MCG PO TABS
0.0625 mg | ORAL_TABLET | Freq: Every day | ORAL | Status: DC
  Administered 2014-10-17 – 2014-10-19 (×3): 0.0625 mg via ORAL
  Filled 2014-10-16 (×3): qty 1

## 2014-10-16 MED ORDER — METOPROLOL TARTRATE 25 MG PO TABS
75.0000 mg | ORAL_TABLET | Freq: Three times a day (TID) | ORAL | Status: DC
  Administered 2014-10-16 – 2014-10-19 (×10): 75 mg via ORAL
  Filled 2014-10-16 (×20): qty 1

## 2014-10-16 MED ORDER — DIGOXIN 250 MCG PO TABS
0.2500 mg | ORAL_TABLET | Freq: Three times a day (TID) | ORAL | Status: AC
  Administered 2014-10-16 – 2014-10-17 (×3): 0.25 mg via ORAL
  Filled 2014-10-16 (×3): qty 1

## 2014-10-16 MED ORDER — WARFARIN SODIUM 7.5 MG PO TABS
7.5000 mg | ORAL_TABLET | Freq: Once | ORAL | Status: AC
  Administered 2014-10-16: 7.5 mg via ORAL
  Filled 2014-10-16: qty 1

## 2014-10-16 NOTE — Progress Notes (Signed)
Have reviewed the  Issue of DIG allergy>> apparently toxicity. We should be able to attend to this by close monitoring of levels. He has issues of renal insufficiency which have been somewhat variable and this was increased a challenge here. He is not a candidate for rhythm control; he does have a previously implanted pacemaker and if we are unable to control rate AV junction ablation may turn out to be of value although at this juncture we need to wonder whether CRT upgrade would be a consequence.

## 2014-10-16 NOTE — Progress Notes (Signed)
Subjective: Wondering why he is being started on more medicines and why he is still here despite several clear explanations about his hospital course. Heart rate increased this AM, rate in the 120-130's. Asymptomatic. No chest pain or SOB.   Objective: Vital signs in last 24 hours: Filed Vitals:   10/16/14 0808 10/16/14 1024 10/16/14 1026 10/16/14 1139  BP:   128/96 134/108  Pulse: 133 134  123  Temp:    98 F (36.7 C)  TempSrc:    Oral  Resp:    20  Height:      Weight:      SpO2:    100%   Weight change: -12.8 oz (-0.363 kg)  Intake/Output Summary (Last 24 hours) at 10/16/14 1153 Last data filed at 10/16/14 1140  Gross per 24 hour  Intake    612 ml  Output   1300 ml  Net   -688 ml   Physical Exam: General: AA male, alert, cooperative, NAD.  HEENT: PERRL, EOMI. Moist mucus membranes Neck: Full range of motion without pain, supple, no lymphadenopathy or carotid bruits Lungs: Clear to ascultation bilaterally, normal work of respiration, no wheezes, rales, rhonchi Heart: Irregularly irregular, no murmurs, gallops, or rubs Abdomen: Soft, non-tender, non-distended, BS + Extremities: No cyanosis, clubbing, or edema. Venous stasis changes in LE's bilaterally.  Neurologic: Alert & oriented x3, cranial nerves II-XII intact, strength grossly intact, sensation intact to light touch   Lab Results: Basic Metabolic Panel:  Recent Labs Lab 10/12/14 0426 10/13/14 0341  10/15/14 0355 10/16/14 0350  NA 141  --   < > 139 140  K 4.5  --   < > 4.2 4.1  CL 107  --   < > 109 108  CO2 24  --   < > 21* 26  GLUCOSE 85  --   < > 88 93  BUN 15  --   < > 14 14  CREATININE 1.50*  --   < > 1.25* 1.31*  CALCIUM 8.7*  --   < > 8.7* 9.3  MG 1.8 1.9  --   --   --   < > = values in this interval not displayed.   Liver Function Tests:  Recent Labs Lab 10/11/14 1205 10/12/14 0426  AST 72* 57*  ALT 15* 17  ALKPHOS 76 66  BILITOT 1.2 1.2  PROT 7.2 5.8*  ALBUMIN 3.7 3.0*    CBC:  Recent Labs Lab 10/11/14 1205  10/15/14 0355 10/16/14 0350  WBC 7.8  < > 8.2 7.8  NEUTROABS 5.4  --   --   --   HGB 14.6  < > 12.5* 13.3  HCT 43.6  < > 37.9* 39.8  MCV 90.8  < > 90.9 90.2  PLT 131*  < > 131* 162  < > = values in this interval not displayed.   Cardiac Enzymes:  Recent Labs Lab 10/11/14 1903 10/11/14 2136 10/12/14 0426  TROPONINI 17.01* 16.22* 13.21*   CBG:  Recent Labs Lab 10/12/14 1314  GLUCAP 138*   Hemoglobin A1C:  Recent Labs Lab 10/11/14 1526  HGBA1C 5.6   Fasting Lipid Panel:  Recent Labs Lab 10/12/14 0426  CHOL 92  HDL 36*  LDLCALC 50  TRIG 31  CHOLHDL 2.6   Thyroid Function Tests:  Recent Labs Lab 10/11/14 2136  TSH 2.031   Coagulation:  Recent Labs Lab 10/11/14 1205 10/14/14 0544 10/15/14 0355 10/16/14 0350  LABPROT 15.1 16.0* 17.3* 19.3*  INR 1.18 1.27  1.40 1.62*   Urine Drug Screen: Drugs of Abuse     Component Value Date/Time   LABOPIA NONE DETECTED 10/11/2014 1724   LABOPIA NEGATIVE 12/27/2008 1943   COCAINSCRNUR NONE DETECTED 10/11/2014 1724   COCAINSCRNUR NEG 07/06/2009 2225   LABBENZ NONE DETECTED 10/11/2014 1724   LABBENZ NEG 07/06/2009 2225   AMPHETMU NONE DETECTED 10/11/2014 1724   AMPHETMU NEG 07/06/2009 2225   THCU POSITIVE* 10/11/2014 1724   LABBARB NONE DETECTED 10/11/2014 1724    Alcohol Level:  Recent Labs Lab 10/11/14 1305  ETH <5   Urinalysis:  Recent Labs Lab 10/11/14 1724  [place]  LABSPEC 1.010  PHURINE 7.0  GLUCOSEU NEGATIVE  HGBUR MODERATE*  BILIRUBINUR NEGATIVE  KETONESUR NEGATIVE  PROTEINUR NEGATIVE  UROBILINOGEN 1.0  NITRITE NEGATIVE  LEUKOCYTESUR MODERATE*    Micro Results: Recent Results (from the past 240 hour(s))  MRSA PCR Screening     Status: None   Collection Time: 10/11/14  5:29 PM  Result Value Ref Range Status   MRSA by PCR NEGATIVE NEGATIVE Final    Comment:        The GeneXpert MRSA Assay (FDA approved for NASAL  specimens only), is one component of a comprehensive MRSA colonization surveillance program. It is not intended to diagnose MRSA infection nor to guide or monitor treatment for MRSA infections.   Surgical pcr screen     Status: Abnormal   Collection Time: 10/12/14  8:51 AM  Result Value Ref Range Status   MRSA, PCR NEGATIVE NEGATIVE Final   Staphylococcus aureus POSITIVE (A) NEGATIVE Final    Comment:        The Xpert SA Assay (FDA approved for NASAL specimens in patients over 33 years of age), is one component of a comprehensive surveillance program.  Test performance has been validated by [facility] for patients greater than or equal to 37 year old. It is not intended to diagnose infection nor to guide or monitor treatment.    Medications: I have reviewed the patient's current medications. Scheduled Meds: . aspirin EC  81 mg Oral Daily  . buPROPion  75 mg Oral BID  . digoxin  0.25 mg Oral Q8H   Followed by  . [START ON 10/17/2014] digoxin  0.0625 mg Oral Daily  . feeding supplement (ENSURE ENLIVE)  237 mL Oral TID BM  . metoprolol  75 mg Oral Q8H  . pantoprazole  40 mg Oral Daily  . ramipril  2.5 mg Oral BID  . rosuvastatin  20 mg Oral q1800  . tamsulosin  0.4 mg Oral Daily  . tiotropium  18 mcg Inhalation Daily  . warfarin  7.5 mg Oral ONCE-1800  . Warfarin - Pharmacist Dosing Inpatient   Does not apply q1800   Continuous Infusions: . heparin 1,350 Units/hr (10/16/14 0455)   PRN Meds:.ipratropium-albuterol, metoprolol, oxyCODONE-acetaminophen, senna-docusate   Assessment/Plan: 69 y/o M w/ PMHx of sick sinus syndrome s/p PPM, h/o atrial fibrillation/flutter non-compliant w/ Coumadin, chronic thrombocytopenia, CKD, and chronic sCHF, admitted for STEMI, Atrial Fibrillation w/ RVR, and TIA symptoms, also found to have LLE arterial thrombus.   Atrial Fibrillation w/ RVR: HR increased overnight, loss of rate control. Patient w/ known h/o atrial fibrillation/flutter  w/ intermittent compliance w/ anticoagulation (Coumadin).This patients CHA2DS2-VASc Score and unadjusted Ischemic Stroke Rate (% per year) is equal to 9.7 % stroke rate/year from a score of 6. Seen by cardiology this AM.  -Started Digoxin 0.25 mg tid for today, followed by 0.0625 mg daily thereafter. Has  a supposed h/o toxicity in the past, will need close monitoring of this.  -Continue Heparin gtt until INR >2.0. Started Coumadin 10/13/14 -Increased Metoprolol to 75 mg tid  CAD: No further chest pain.  -Continue ASA 81 mg daily -Continue Heparin gtt until INR >2.0 for atrial fibrillation as below -Lopressor 75 mg bid -Crestor 20 mg qhs  LLE Arterial Emboli: Patient w/ severe pain in the LLE on 10/12/14, went emergently for embolectomy. Pain resolved. -Management per [facility]; appreciate consult -Heparin, Coumadin as above  TIA/CVA: No further neuro symptoms -ASA -Heparin, Coumadin -Statin as above -PT/OT  AKI: Back to baseline. May also be related to embolic event. Cr trend as follows:   Recent Labs Lab 10/11/14 1310 10/12/14 0426 10/13/14 0607 10/15/14 0355 10/16/14 0350  CREATININE 1.60* 1.50* 1.31* 1.25* 1.31*  -Repeat BMP in AM -ACEI   Chronic sCHF: Stable.  -BB as above -Started on Ramipril 2.5 mg bid  Depression: Stable.  -Wellbutrin  GERD: Stable. -Protonix  Chronic Pain: Stable.  -Percocet prn  BPH: Stable.  -Flomax  Dispo: Disposition is deferred at this time, awaiting improvement of current medical problems.  Anticipated discharge in approximately 1-2 day(s).   The patient does have a current PCP Bertha Stakes, MD) and does need an [facility] hospital follow-up appointment after discharge.  The patient does not have transportation limitations that hinder transportation to clinic appointments.  .Services Needed at time of discharge: Y = Yes, Blank = No PT:   OT:   RN:   Equipment:   Other:     LOS: 5 days   Corky Sox, MD 10/16/2014, 11:53 AM

## 2014-10-16 NOTE — Progress Notes (Addendum)
Patient Name: [name]      SUBJECTIVE: 69 year old male admitted with a TIA. Courses also been complicated by left lower extremity embolectomy as well as an MI with the thinking being that all of these came for atrial fibrillation in the context of noncompliance with anticoagulation. He has a history of hypertension and chronic kidney disease as well as congestive heart failure. Echo 5/16 demonstrated EF 30-35%<< 45-50% 7/15  He has no complaints of shortness of breath or chest pain today. He sat up yesterday walked around a little bit.  His major complaint is of early satiety.  He has a history of a previously implanted [employer]. Jude pacemaker. Apparently there was a lead failure as there is an abandoned ventricular lead.  Generator replacement 2010.  Past Medical History  Diagnosis Date  . Hypertension   . Chronic systolic heart failure     a. NICM - EF 35% with normal cors 2003. b. Last echo 11/2012 - EF 25-30%.  . Depression   . GERD (gastroesophageal reflux disease)   . Portal vein thrombosis   . Avascular necrosis of bones of both hips     Status post bilateral hip replacements  . Gastritis   . Alcohol abuse   . Erectile dysfunction   . Bipolar disorder   . Hydrocele, unspecified   . Intermittent claudication   . Lung nodule     Chest CT scan on 12/14/2008 showed a nodular opacity at the left lung base felt to most likely represent scarring.  Follow-up chest CT scan on 06/02/2010 showed parenchymal scarring in the left apex, left  lower lobe, and lingula; some of this scarring at the left base had a nodular appearance, unchanged. Chest 09/2012 - stable.  . 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  Thyroid scan 08/28/2009 was normal with no focal areas of abnormal increased or decreased activity seen; the uptake of I 131 sodium iodide at 24 hours was 5.7%.  TSH and free T4 normalized by 08/16/2009.  . Intestinal obstruction    . COPD (chronic obstructive pulmonary disease)   . Clostridium difficile colitis   . E coli bacteremia   . DVT (deep venous thrombosis)     He has hypercoagulability with multiple prior DVTs  . Pleural plaque     H/o asbestos exposure. Chest CT on 06/02/2010 showed stable extensive calcified pleural plaques involving the left hemithorax, consistent with asbestos related pleural disease.  . Weight loss     Normal colonoscopy by Dr. Olevia Perches on 11/06/2010.  . Tachycardia-bradycardia syndrome     a. 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 b. St. Jude gen change 2010.  [name] PAF (paroxysmal atrial fibrillation)     Paroxysmal, failed medical therapy with amiodarone but has done very well with multaq;  d/c 2/2 to recurrent AFib;  Tikosyn load c/b Hyperkalemia during admx 05/2013 => rate control strategy  . 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  . Noncompliance   . Atrial flutter   . Thrombocytopenia   . Hyperkalemia     type 4 RTA  . Pacemaker   . Shortness of breath     "all the time now" (07/02/2013)  . On home oxygen therapy     "3L prn; need it maybe 3-4 times/wk" (10/11/2014)  . Pneumonia   . Cluster headaches     "~  twice/wk" (07/02/2013)  . Osteoarthritis   . Spondylosis   . Arthritis     "hips and legs" (07/02/2013)  . Chronic back pain     "all over my back" (07/02/2013)  . Anxiety   . CKD (chronic kidney disease)     Renal U/S 12/04/2009 showed no pathological findings. Labs 12/04/2009 include normal ESR, C3, C4; neg ANA; SPEP showed nonspecific increase in the alpha-2 region with no M-spike; UPEP showed no monoclonal free light chains; urine IFE showed polyclonal increase in feree Kappa and/or free Lambda light chains. Baseline Cr reported 1.7-2.5.  . STEMI (ST elevation myocardial infarction) 10/11/2014  . CHF (congestive heart failure)     Scheduled Meds:  Scheduled Meds: .  aspirin EC  81 mg Oral Daily  . buPROPion  75 mg Oral BID  . feeding supplement (ENSURE ENLIVE)  237 mL Oral TID BM  . metoprolol  75 mg Oral BID  . pantoprazole  40 mg Oral Daily  . ramipril  2.5 mg Oral BID  . rosuvastatin  20 mg Oral q1800  . tamsulosin  0.4 mg Oral Daily  . tiotropium  18 mcg Inhalation Daily  . Warfarin - Pharmacist Dosing Inpatient   Does not apply q1800   Continuous Infusions: . heparin 1,350 Units/hr (10/16/14 0455)   ipratropium-albuterol, metoprolol, oxyCODONE-acetaminophen, senna-docusate    PHYSICAL EXAM Filed Vitals:   10/16/14 0528 10/16/14 0619 10/16/14 0800 10/16/14 0808  BP: 145/119 130/104 136/109   Pulse: 131 113 72 133  Temp:   98 F (36.7 C)   TempSrc:   Oral   Resp: $Remo'16 18 18   'XTsGg$ Height:      Weight:      SpO2: 100% 100% 99%    Well developed and nourished in no acute distress HENT normal Neck supple with JVP-flat Carotids brisk and full without bruits Course breath sounds Irregularly irregular rate and rhythm with controlled  ventricular response, no murmurs or gallops Abd-soft with active BS without hepatomegaly No Clubbing cyanosis edema Skin-warm and dry A & Oriented  Grossly normal sensory and motor function   TELEMETRY: Reviewed telemetry pt in afib with an occasional paced beat.    Intake/Output Summary (Last 24 hours) at 10/16/14 0834 Last data filed at 10/16/14 0455  Gross per 24 hour  Intake    612 ml  Output    800 ml  Net   -188 ml    LABS: Basic Metabolic Panel:  Recent Labs Lab 10/11/14 1205 10/11/14 1310  10/12/14 0426 10/13/14 0341 10/13/14 0607 10/15/14 0355 10/16/14 0350  NA 143 143  --  141  --  138 139 140  K 4.1 4.0  --  4.5  --  4.3 4.2 4.1  CL 109 107  --  107  --  108 109 108  CO2 24  --   --  24  --  24 21* 26  GLUCOSE 116* 114*  --  85  --  86 88 93  BUN 17 20  --  15  --  $R'13 14 14  'qa$ CREATININE 1.68* 1.60*  --  1.50*  --  1.31* 1.25* 1.31*  CALCIUM 9.3  --   --  8.7*  --  8.4* 8.7*  9.3  MG  --   --   < > 1.8 1.9  --   --   --   < > = values in this interval not displayed. Cardiac Enzymes: No results for input(s): CKTOTAL, CKMB, CKMBINDEX, TROPONINI in  the last 72 hours. CBC:  Recent Labs Lab 10/11/14 1205 10/11/14 1310 10/12/14 0426 10/13/14 0607 10/14/14 0544 10/15/14 0355 10/16/14 0350  WBC 7.8  --  5.9 9.9 8.7 8.2 7.8  NEUTROABS 5.4  --   --   --   --   --   --   HGB 14.6 16.7 13.4 12.5* 12.3* 12.5* 13.3  HCT 43.6 49.0 40.1 37.2* 36.5* 37.9* 39.8  MCV 90.8  --  91.3 91.2 91.5 90.9 90.2  PLT 131*  --  121* 122* 125* 131* 162   PROTIME:  Recent Labs  10/14/14 0544 10/15/14 0355 10/16/14 0350  LABPROT 16.0* 17.3* 19.3*  INR 1.27 1.40 1.62*   Liver Function Tests: No results for input(s): AST, ALT, ALKPHOS, BILITOT, PROT, ALBUMIN in the last 72 hours. No results for input(s): LIPASE, AMYLASE in the last 72 hours. BNP: BNP (last 3 results) No results for input(s): BNP in the last 8760 hours.  ProBNP (last 3 results)  Recent Labs  12/10/13 1810  PROBNP 6010.0*       ASSESSMENT AND PLAN:  Principal Problem:   STEMI (ST elevation myocardial infarction) Active Problems:   Essential hypertension   Chronic kidney disease   Chronic combined systolic and diastolic congestive heart failure   Weight loss   Atrial fibrillation with RVR   TIA (transient ischemic attack)   SOB (shortness of breath)   Early satiety   Cr stable thus far :) on ACE  Reiterated the importance of Coumadin. INR improving   Loss of rate control overnight>> will increase the frequency of his metoprolol in the context of LV dysfunction we'll add digoxin   Continue heparin until his Coumadin is therapeutic-anticipate 24 hour overlap  His early satiety is potentially related to heart failure associated with a rapid rate, however, don't see JVD which I would expect. He certainly is at risk for GI pathology. Will defer this to primary team.   Signed, Virl Axe MD  10/16/2014

## 2014-10-16 NOTE — Progress Notes (Signed)
Physical Therapy Treatment Patient Details Name: [name] MRN: [id number] DOB: 26-May-1945 Today's Date: 2014/11/10    History of Present Illness Pt adm with TIA and STEMI. Pt developed LLE emboli and required a thrombectomy on 5/25.  PMH - RLE embolectomy and fasiciotomies, pacer, COPD, bipolar,bil THA    PT Comments    Pt amb well with walker. Distance limited by pt wanting to wash up.  Follow Up Recommendations  Supervision - Intermittent;No PT follow up     Equipment Recommendations  None recommended by PT    Recommendations for Other Services       Precautions / Restrictions Precautions Precautions: Fall    Mobility  Bed Mobility Overal bed mobility: Modified Independent                Transfers Overall transfer level: Needs assistance Equipment used: Rolling walker (2 wheeled) Transfers: Sit to/from Stand Sit to Stand: Supervision            Ambulation/Gait Ambulation/Gait assistance: Supervision Ambulation Distance (Feet): 30 Feet Assistive device: Rolling walker (2 wheeled) Gait Pattern/deviations: Step-through pattern;Decreased stride length Gait velocity: decr Gait velocity interpretation: Below normal speed for age/gender General Gait Details: No loss of balance   Stairs            Wheelchair Mobility    Modified Rankin (Stroke Patients Only) Modified Rankin (Stroke Patients Only) Pre-Morbid Rankin Score: No significant disability Modified Rankin: Moderately severe disability     Balance Overall balance assessment: Needs assistance Sitting-balance support: No upper extremity supported;Feet supported Sitting balance-Leahy Scale: Normal       Standing balance-Leahy Scale: Poor                      Cognition Arousal/Alertness: Awake/alert Behavior During Therapy: WFL for tasks assessed/performed Overall Cognitive Status: Within Functional Limits for tasks assessed                      Exercises       General Comments        Pertinent Vitals/Pain Pain Assessment: Faces Faces Pain Scale: Hurts a little bit Pain Intervention(s): Limited activity within patient's tolerance;Premedicated before session;Repositioned    Home Living                      Prior Function            PT Goals (current goals can now be found in the care plan section) Acute Rehab PT Goals Patient Stated Goal: return home PT Goal Formulation: With patient Time For Goal Achievement: 10/22/14 Potential to Achieve Goals: Good Progress towards PT goals: Progressing toward goals    Frequency  Min 3X/week    PT Plan Discharge plan needs to be updated    Co-evaluation             End of Session Equipment Utilized During Treatment: Oxygen Activity Tolerance: Patient tolerated treatment well Patient left: in chair;with call bell/phone within reach;with chair alarm set     Time: 1344-1359 PT Time Calculation (min) (ACUTE ONLY): 15 min  Charges:  $Gait Training: 8-22 mins                    G Codes:      Andrue Dini 10-Nov-2014, 2:12 PM  [employer] PT [phone]

## 2014-10-16 NOTE — Progress Notes (Signed)
ANTICOAGULATION CONSULT NOTE - Follow Up Consult  Pharmacy Consult for Heparin, warfarin Indication: chest pain/ACS, a fib, VTE   Allergies  Allergen Reactions  . Digoxin And Related Other (See Comments)    Dig toxicity 2009  . Penicillins Rash    Patient Measurements: Height: _0  (190.5 cm) Weight: 146 lb (66.225 kg) IBW/kg (Calculated) : 84.5  Heparin Dosing Weight: 70 kg  Vital Signs: Temp: 98 F (36.7 C) (05/29 0800) Temp Source: Oral (05/29 0800) BP: 136/109 mmHg (05/29 0800) Pulse Rate: 133 (05/29 0808)  Labs:  Recent Labs  10/14/14 0544 10/14/14 1913 10/15/14 0355 10/16/14 0350  HGB 12.3*  --  12.5* 13.3  HCT 36.5*  --  37.9* 39.8  PLT 125*  --  131* 162  LABPROT 16.0*  --  17.3* 19.3*  INR 1.27  --  1.40 1.62*  HEPARINUNFRC 0.31 0.49 0.33 0.44  CREATININE  --   --  1.25* 1.31*    Estimated Creatinine Clearance: 49.1 mL/min (by C-G formula based on Cr of 1.31).   Medical History: Past Medical History  Diagnosis Date  . Hypertension   . Chronic systolic heart failure     a. NICM - EF 35% with normal cors 2003. b. Last echo 11/2012 - EF 25-30%.  . Depression   . GERD (gastroesophageal reflux disease)   . Portal vein thrombosis   . Avascular necrosis of bones of both hips     Status post bilateral hip replacements  . Gastritis   . Alcohol abuse   . Erectile dysfunction   . Bipolar disorder   . Hydrocele, unspecified   . Intermittent claudication   . Lung nodule     Chest CT scan on 12/14/2008 showed a nodular opacity at the left lung base felt to most likely represent scarring.  Follow-up chest CT scan on 06/02/2010 showed parenchymal scarring in the left apex, left  lower lobe, and lingula; some of this scarring at the left base had a nodular appearance, unchanged. Chest 09/2012 - stable.  . 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  Thyroid scan 08/28/2009 was normal with no focal areas of abnormal  increased or decreased activity seen; the uptake of I 131 sodium iodide at 24 hours was 5.7%.  TSH and free T4 normalized by 08/16/2009.  . Intestinal obstruction   . COPD (chronic obstructive pulmonary disease)   . Clostridium difficile colitis   . E coli bacteremia   . DVT (deep venous thrombosis)     He has hypercoagulability with multiple prior DVTs  . Pleural plaque     H/o asbestos exposure. Chest CT on 06/02/2010 showed stable extensive calcified pleural plaques involving the left hemithorax, consistent with asbestos related pleural disease.  . Weight loss     Normal colonoscopy by Dr. Olevia Perches on 11/06/2010.  . Tachycardia-bradycardia syndrome     a. 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 b. St. Jude gen change 2010.  [name] PAF (paroxysmal atrial fibrillation)     Paroxysmal, failed medical therapy with amiodarone but has done very well with multaq;  d/c 2/2 to recurrent AFib;  Tikosyn load c/b Hyperkalemia during admx 05/2013 => rate control strategy  . 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  . Noncompliance   . Atrial flutter   . Thrombocytopenia   . Hyperkalemia     type 4 RTA  . Pacemaker   .  Shortness of breath     "all the time now" (07/02/2013)  . On home oxygen therapy     "3L prn; need it maybe 3-4 times/wk" (10/11/2014)  . Pneumonia   . Cluster headaches     "~ twice/wk" (07/02/2013)  . Osteoarthritis   . Spondylosis   . Arthritis     "hips and legs" (07/02/2013)  . Chronic back pain     "all over my back" (07/02/2013)  . Anxiety   . CKD (chronic kidney disease)     Renal U/S 12/04/2009 showed no pathological findings. Labs 12/04/2009 include normal ESR, C3, C4; neg ANA; SPEP showed nonspecific increase in the alpha-2 region with no M-spike; UPEP showed no monoclonal free light chains; urine IFE showed polyclonal increase in feree Kappa and/or free Lambda light chains.  Baseline Cr reported 1.7-2.5.  . STEMI (ST elevation myocardial infarction) 10/11/2014  . CHF (congestive heart failure)     Medications:  Heparin infusion at 1350 units/hr  Assessment: 69 y.o. male on Coumadin PTA (noncompliant) for h/o DVT and PAF presented with new LLE arterial thrombus s/p L femoral thrombectomy on IV heparin. Heparin level this AM remains therapeutic at 0.44. INR remains SUBtherapeutic but continues to trend up to 1.62.  H/H and Plt improved and wnl.  Patient reports non-compliance to warfarin PTA, but reportedly was supposed to be on alternating doses of 5 mg and 7.5 mg (last outpatient coumadin note in 04/2014 stated dose was 7.5 mg daily and INR was therapeutic at that visit)  Goal of Therapy:  Heparin level 0.3-0.7 units/ml  INR 2-3 Monitor platelets by anticoagulation protocol: Yes   Plan:  Continue heparin at 1350 units/hr Repeat Coumadin 7.39m PO x 1 tonight Continue daily heparin level, CBC, and INR Plan to counsel patient on importance of compliance   Mertice Uffelman K. NVelva Harman PharmD, BBuckshotClinical Pharmacist - Resident Pager: [phone]: [phone]/29/2016 8:53 AM

## 2014-10-17 DIAGNOSIS — G8929 Other chronic pain: Secondary | ICD-10-CM

## 2014-10-17 DIAGNOSIS — K219 Gastro-esophageal reflux disease without esophagitis: Secondary | ICD-10-CM

## 2014-10-17 DIAGNOSIS — N400 Enlarged prostate without lower urinary tract symptoms: Secondary | ICD-10-CM

## 2014-10-17 DIAGNOSIS — F329 Major depressive disorder, single episode, unspecified: Secondary | ICD-10-CM

## 2014-10-17 LAB — CBC
HEMATOCRIT: 39.2 % (ref 39.0–52.0)
HEMOGLOBIN: 12.9 g/dL — AB (ref 13.0–17.0)
MCH: 29.9 pg (ref 26.0–34.0)
MCHC: 32.9 g/dL (ref 30.0–36.0)
MCV: 91 fL (ref 78.0–100.0)
Platelets: 166 10*3/uL (ref 150–400)
RBC: 4.31 MIL/uL (ref 4.22–5.81)
RDW: 14 % (ref 11.5–15.5)
WBC: 7.9 10*3/uL (ref 4.0–10.5)

## 2014-10-17 LAB — PROTIME-INR
INR: 2.16 — AB (ref 0.00–1.49)
Prothrombin Time: 23.9 seconds — ABNORMAL HIGH (ref 11.6–15.2)

## 2014-10-17 LAB — HEPARIN LEVEL (UNFRACTIONATED): Heparin Unfractionated: 0.56 IU/mL (ref 0.30–0.70)

## 2014-10-17 MED ORDER — WARFARIN SODIUM 5 MG PO TABS
5.0000 mg | ORAL_TABLET | Freq: Once | ORAL | Status: AC
  Administered 2014-10-17: 5 mg via ORAL
  Filled 2014-10-17: qty 1

## 2014-10-17 MED ORDER — DIPHENHYDRAMINE-ZINC ACETATE 2-0.1 % EX CREA
TOPICAL_CREAM | Freq: Three times a day (TID) | CUTANEOUS | Status: DC | PRN
  Administered 2014-10-17 – 2014-10-19 (×2): via TOPICAL
  Filled 2014-10-17: qty 28

## 2014-10-17 NOTE — Progress Notes (Signed)
Subjective: Complaining about getting his blood drawn this AM. No palpitations, dizziness, lightheadedness, chest pain, or SOB.   Objective: Vital signs in last 24 hours: Filed Vitals:   10/17/14 0816 10/17/14 0937 10/17/14 0938 10/17/14 1216  BP: 125/87  129/98 132/88  Pulse: 123 130  130  Temp: 98.3 F (36.8 C)   98.7 F (37.1 C)  TempSrc:    Oral  Resp:    20  Height:      Weight:      SpO2: 98%   99%   Weight change: 9 lb 10.3 oz (4.375 kg)  Intake/Output Summary (Last 24 hours) at 10/17/14 1547 Last data filed at 10/17/14 1216  Gross per 24 hour  Intake    681 ml  Output   1400 ml  Net   -719 ml   Physical Exam: General: AA male, alert, cooperative, NAD.  HEENT: PERRL, EOMI. Moist mucus membranes Neck: Full range of motion without pain, supple, no lymphadenopathy or carotid bruits Lungs: Clear to ascultation bilaterally, normal work of respiration, no wheezes, rales, rhonchi Heart: Irregularly irregular, no murmurs, gallops, or rubs Abdomen: Soft, non-tender, non-distended, BS + Extremities: No cyanosis, clubbing, or edema. Venous stasis changes in LE's bilaterally.  Neurologic: Alert & oriented x3, cranial nerves II-XII intact, strength grossly intact, sensation intact to light touch   Lab Results: Basic Metabolic Panel:  Recent Labs Lab 10/12/14 0426 10/13/14 0341  10/15/14 0355 10/16/14 0350  NA 141  --   < > 139 140  K 4.5  --   < > 4.2 4.1  CL 107  --   < > 109 108  CO2 24  --   < > 21* 26  GLUCOSE 85  --   < > 88 93  BUN 15  --   < > 14 14  CREATININE 1.50*  --   < > 1.25* 1.31*  CALCIUM 8.7*  --   < > 8.7* 9.3  MG 1.8 1.9  --   --   --   < > = values in this interval not displayed.   Liver Function Tests:  Recent Labs Lab 10/11/14 1205 10/12/14 0426  AST 72* 57*  ALT 15* 17  ALKPHOS 76 66  BILITOT 1.2 1.2  PROT 7.2 5.8*  ALBUMIN 3.7 3.0*   CBC:  Recent Labs Lab 10/11/14 1205  10/16/14 0350 10/17/14 0910  WBC 7.8  < > 7.8  7.9  NEUTROABS 5.4  --   --   --   HGB 14.6  < > 13.3 12.9*  HCT 43.6  < > 39.8 39.2  MCV 90.8  < > 90.2 91.0  PLT 131*  < > 162 166  < > = values in this interval not displayed.   Cardiac Enzymes:  Recent Labs Lab 10/11/14 1903 10/11/14 2136 10/12/14 0426  TROPONINI 17.01* 16.22* 13.21*   CBG:  Recent Labs Lab 10/12/14 1314  GLUCAP 138*   Hemoglobin A1C:  Recent Labs Lab 10/11/14 1526  HGBA1C 5.6   Fasting Lipid Panel:  Recent Labs Lab 10/12/14 0426  CHOL 92  HDL 36*  LDLCALC 50  TRIG 31  CHOLHDL 2.6   Thyroid Function Tests:  Recent Labs Lab 10/11/14 2136  TSH 2.031   Coagulation:  Recent Labs Lab 10/14/14 0544 10/15/14 0355 10/16/14 0350 10/17/14 0910  LABPROT 16.0* 17.3* 19.3* 23.9*  INR 1.27 1.40 1.62* 2.16*   Urine Drug Screen: Drugs of Abuse     Component Value Date/Time  LABOPIA NONE DETECTED 10/11/2014 1724   [place] 12/27/2008 1943   COCAINSCRNUR NONE DETECTED 10/11/2014 1724   COCAINSCRNUR NEG 07/06/2009 2225   LABBENZ NONE DETECTED 10/11/2014 1724   LABBENZ NEG 07/06/2009 2225   AMPHETMU NONE DETECTED 10/11/2014 1724   AMPHETMU NEG 07/06/2009 2225   THCU POSITIVE* 10/11/2014 1724   LABBARB NONE DETECTED 10/11/2014 1724    Alcohol Level:  Recent Labs Lab 10/11/14 1305  ETH <5   Urinalysis:  Recent Labs Lab 10/11/14 1724  [place]  LABSPEC 1.010  PHURINE 7.0  GLUCOSEU NEGATIVE  HGBUR MODERATE*  BILIRUBINUR NEGATIVE  KETONESUR NEGATIVE  PROTEINUR NEGATIVE  UROBILINOGEN 1.0  NITRITE NEGATIVE  LEUKOCYTESUR MODERATE*    Micro Results: Recent Results (from the past 240 hour(s))  MRSA PCR Screening     Status: None   Collection Time: 10/11/14  5:29 PM  Result Value Ref Range Status   MRSA by PCR NEGATIVE NEGATIVE Final    Comment:        The GeneXpert MRSA Assay (FDA approved for NASAL specimens only), is one component of a comprehensive MRSA colonization surveillance program. It is  not intended to diagnose MRSA infection nor to guide or monitor treatment for MRSA infections.   Surgical pcr screen     Status: Abnormal   Collection Time: 10/12/14  8:51 AM  Result Value Ref Range Status   MRSA, PCR NEGATIVE NEGATIVE Final   Staphylococcus aureus POSITIVE (A) NEGATIVE Final    Comment:        The Xpert SA Assay (FDA approved for NASAL specimens in patients over 74 years of age), is one component of a comprehensive surveillance program.  Test performance has been validated by [facility] for patients greater than or equal to 9 year old. It is not intended to diagnose infection nor to guide or monitor treatment.    Medications: I have reviewed the patient's current medications. Scheduled Meds: . aspirin EC  81 mg Oral Daily  . buPROPion  75 mg Oral BID  . digoxin  0.0625 mg Oral Daily  . feeding supplement (ENSURE ENLIVE)  237 mL Oral TID BM  . metoprolol  75 mg Oral Q8H  . pantoprazole  40 mg Oral Daily  . ramipril  2.5 mg Oral BID  . rosuvastatin  20 mg Oral q1800  . tamsulosin  0.4 mg Oral Daily  . tiotropium  18 mcg Inhalation Daily  . warfarin  5 mg Oral ONCE-1800  . Warfarin - Pharmacist Dosing Inpatient   Does not apply q1800   Continuous Infusions: . heparin 1,350 Units/hr (10/17/14 0400)   PRN Meds:.ipratropium-albuterol, metoprolol, oxyCODONE-acetaminophen, senna-docusate   Assessment/Plan: 69 y/o M w/ PMHx of sick sinus syndrome s/p PPM, h/o atrial fibrillation/flutter non-compliant w/ Coumadin, chronic thrombocytopenia, CKD, and chronic sCHF, admitted for STEMI, Atrial Fibrillation w/ RVR, and TIA symptoms, also found to have LLE arterial thrombus.   Atrial Fibrillation w/ RVR: HR continues to be a bit more rapid, mostly in the low 100's. Patient w/ known h/o atrial fibrillation/flutter w/ intermittent compliance w/ anticoagulation (Coumadin).This patients CHA2DS2-VASc Score and unadjusted Ischemic Stroke Rate (% per year) is equal to 9.7  % stroke rate/year from a score of 6. INR 2.16 today. -Continue Digoxin 0.0625 mg daily. Has a supposed h/o toxicity in the past, will need close monitoring of this.  -Continue Heparin gtt through tomorrow, INR therapeutic. Continue Coumadin 10/13/14 -Continue Metoprolol to 75 mg tid -May need addition of Cardizem in AM  CAD:  No chest pain.  -Continue ASA 81 mg daily -Continue Heparin gtt until INR >2.0 for atrial fibrillation as below -Lopressor 75 mg bid -Crestor 20 mg qhs  LLE Arterial Emboli: Patient w/ severe pain in the LLE on 10/12/14, went emergently for embolectomy. Pain resolved. -Management per [facility]VS; appreciate consult -Heparin, Coumadin as above  AKI: Back to baseline. Cr trend as follows:   Recent Labs Lab 10/11/14 1310 10/12/14 0426 10/13/14 0607 10/15/14 0355 10/16/14 0350  CREATININE 1.60* 1.50* 1.31* 1.25* 1.31*  -Repeat BMP in AM -ACEI   Chronic sCHF: Stable.  -BB as above -Started on Ramipril 2.5 mg bid  Depression: Stable.  -Wellbutrin  GERD: Stable. -Protonix  Chronic Pain: Stable.  -Percocet prn  BPH: Stable.  -Flomax  Dispo: Disposition is deferred at this time, awaiting improvement of current medical problems.  Anticipated discharge in approximately 1-2 day(s).   The patient does have a current PCP Bertha Stakes, MD) and does need an [facility] follow-up appointment after discharge.  The patient does not have transportation limitations that hinder transportation to clinic appointments.  .Services Needed at time of discharge: Y = Yes, Blank = No PT:   OT:   RN:   Equipment:   Other:     LOS: 6 days   Corky Sox, MD 10/17/2014, 3:47 PM

## 2014-10-17 NOTE — Progress Notes (Signed)
  Date: 10/17/2014  Patient name: [name]  Medical record number: [id number]  Date of birth: [birth year]   This patient's plan of care was discussed with the house staff. Please see Dr. Ronnald Ramp' upcoming note for complete details. I concur with his findings.  [name] is doing better today.  INR is at goal, crossover with heparin X 24 hours.  Check INR in the AM.    Sid Falcon, MD 10/17/2014, 9:40 PM

## 2014-10-17 NOTE — Progress Notes (Signed)
OT Cancellation Note  Patient Details Name: [name] MRN: [id number] DOB: 10/20/1945   Cancelled Treatment:    Reason Eval/Treat Not Completed: Other (comment). OT checked on pt this morning to give him an idea of when therapy will be today. Went back to see pt, and he was not agreeable to getting up with OT and wanted to sleep.   Benito Mccreedy  OTR/L [phone]  10/17/2014, 11:02 AM

## 2014-10-17 NOTE — Progress Notes (Signed)
Patient Name: [name]      SUBJECTIVE: 69 year old male admitted with a TIA. Courses also been complicated by left lower extremity embolectomy as well as an MI with the thinking being that all of these came for atrial fibrillation in the context of noncompliance with anticoagulation. He has a history of hypertension and chronic kidney disease as well as congestive heart failure. Echo 5/16 demonstrated EF 30-35%<< 45-50% 7/15  He has no complaints of shortness of breath or chest pain today. He sat up yesterday walked around a little bit.  His major complaint is of early satiety.  He has a history of a previously implanted St. Jude pacemaker. Apparently there was a lead failure as there is an abandoned ventricular lead.  Generator replacement 2010.  Past Medical History  Diagnosis Date  . Hypertension   . Chronic systolic heart failure     a. NICM - EF 35% with normal cors 2003. b. Last echo 11/2012 - EF 25-30%.  . Depression   . GERD (gastroesophageal reflux disease)   . Portal vein thrombosis   . Avascular necrosis of bones of both hips     Status post bilateral hip replacements  . Gastritis   . Alcohol abuse   . Erectile dysfunction   . Bipolar disorder   . Hydrocele, unspecified   . Intermittent claudication   . Lung nodule     Chest CT scan on 12/14/2008 showed a nodular opacity at the left lung base felt to most likely represent scarring.  Follow-up chest CT scan on 06/02/2010 showed parenchymal scarring in the left apex, left  lower lobe, and lingula; some of this scarring at the left base had a nodular appearance, unchanged. Chest 09/2012 - stable.  . 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  Thyroid scan 08/28/2009 was normal with no focal areas of abnormal increased or decreased activity seen; the uptake of I 131 sodium iodide at 24 hours was 5.7%.  TSH and free T4 normalized by 08/16/2009.  . Intestinal obstruction    . COPD (chronic obstructive pulmonary disease)   . Clostridium difficile colitis   . E coli bacteremia   . DVT (deep venous thrombosis)     He has hypercoagulability with multiple prior DVTs  . Pleural plaque     H/o asbestos exposure. Chest CT on 06/02/2010 showed stable extensive calcified pleural plaques involving the left hemithorax, consistent with asbestos related pleural disease.  . Weight loss     Normal colonoscopy by Dr. Olevia Perches on 11/06/2010.  . Tachycardia-bradycardia syndrome     a. 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 b. St. Jude gen change 2010.  [name] PAF (paroxysmal atrial fibrillation)     Paroxysmal, failed medical therapy with amiodarone but has done very well with multaq;  d/c 2/2 to recurrent AFib;  Tikosyn load c/b Hyperkalemia during admx 05/2013 => rate control strategy  . 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  . Noncompliance   . Atrial flutter   . Thrombocytopenia   . Hyperkalemia     type 4 RTA  . Pacemaker   . Shortness of breath     "all the time now" (07/02/2013)  . On home oxygen therapy     "3L prn; need it maybe 3-4 times/wk" (10/11/2014)  . Pneumonia   . Cluster headaches     "~  twice/wk" (07/02/2013)  . Osteoarthritis   . Spondylosis   . Arthritis     "hips and legs" (07/02/2013)  . Chronic back pain     "all over my back" (07/02/2013)  . Anxiety   . CKD (chronic kidney disease)     Renal U/S 12/04/2009 showed no pathological findings. Labs 12/04/2009 include normal ESR, C3, C4; neg ANA; SPEP showed nonspecific increase in the alpha-2 region with no M-spike; UPEP showed no monoclonal free light chains; urine IFE showed polyclonal increase in feree Kappa and/or free Lambda light chains. Baseline Cr reported 1.7-2.5.  . STEMI (ST elevation myocardial infarction) 10/11/2014  . CHF (congestive heart failure)     Scheduled Meds:  Scheduled Meds: .  aspirin EC  81 mg Oral Daily  . buPROPion  75 mg Oral BID  . digoxin  0.0625 mg Oral Daily  . feeding supplement (ENSURE ENLIVE)  237 mL Oral TID BM  . metoprolol  75 mg Oral Q8H  . pantoprazole  40 mg Oral Daily  . ramipril  2.5 mg Oral BID  . rosuvastatin  20 mg Oral q1800  . tamsulosin  0.4 mg Oral Daily  . tiotropium  18 mcg Inhalation Daily  . Warfarin - Pharmacist Dosing Inpatient   Does not apply q1800   Continuous Infusions: . heparin 1,350 Units/hr (10/17/14 0400)   ipratropium-albuterol, metoprolol, oxyCODONE-acetaminophen, senna-docusate    PHYSICAL EXAM Filed Vitals:   10/17/14 0103 10/17/14 0108 10/17/14 0410 10/17/14 0600  BP: 114/73  115/64   Pulse: 111 111 112   Temp: 98.5 F (36.9 C)  98.4 F (36.9 C)   TempSrc: Oral  Oral   Resp: 17  16   Height:      Weight:    70.6 kg (155 lb 10.3 oz)  SpO2: 100%  100%    Well developed and nourished in no acute distress HENT normal Neck supple with JVP-flat Carotids brisk and full without bruits Course breath sounds Irregularly irregular rate and rhythm with controlled  ventricular response, no murmurs or gallops Abd-soft with active BS without hepatomegaly No Clubbing cyanosis edema Skin-warm and dry A & Oriented  Grossly normal sensory and motor function   TELEMETRY: Reviewed telemetry pt in afib with an occasional paced beat.    Intake/Output Summary (Last 24 hours) at 10/17/14 0809 Last data filed at 10/17/14 0400  Gross per 24 hour  Intake    681 ml  Output   1400 ml  Net   -719 ml    LABS: Basic Metabolic Panel:  Recent Labs Lab 10/11/14 1205 10/11/14 1310  10/12/14 0426 10/13/14 0341 10/13/14 0607 10/15/14 0355 10/16/14 0350  NA 143 143  --  141  --  138 139 140  K 4.1 4.0  --  4.5  --  4.3 4.2 4.1  CL 109 107  --  107  --  108 109 108  CO2 24  --   --  24  --  24 21* 26  GLUCOSE 116* 114*  --  85  --  86 88 93  BUN 17 20  --  15  --  _0 CREATININE 1.68* 1.60*  --  1.50*  --   1.31* 1.25* 1.31*  CALCIUM 9.3  --   --  8.7*  --  8.4* 8.7* 9.3  MG  --   --   < > 1.8 1.9  --   --   --   < > = values in  this interval not displayed. Cardiac Enzymes: No results for input(s): CKTOTAL, CKMB, CKMBINDEX, TROPONINI in the last 72 hours. CBC:  Recent Labs Lab 10/11/14 1205 10/11/14 1310 10/12/14 0426 10/13/14 0607 10/14/14 0544 10/15/14 0355 10/16/14 0350  WBC 7.8  --  5.9 9.9 8.7 8.2 7.8  NEUTROABS 5.4  --   --   --   --   --   --   HGB 14.6 16.7 13.4 12.5* 12.3* 12.5* 13.3  HCT 43.6 49.0 40.1 37.2* 36.5* 37.9* 39.8  MCV 90.8  --  91.3 91.2 91.5 90.9 90.2  PLT 131*  --  121* 122* 125* 131* 162   PROTIME:  Recent Labs  10/15/14 0355 10/16/14 0350  LABPROT 17.3* 19.3*  INR 1.40 1.62*   Liver Function Tests: No results for input(s): AST, ALT, ALKPHOS, BILITOT, PROT, ALBUMIN in the last 72 hours. No results for input(s): LIPASE, AMYLASE in the last 72 hours. BNP: BNP (last 3 results) No results for input(s): BNP in the last 8760 hours.  ProBNP (last 3 results)  Recent Labs  12/10/13 1810  PROBNP 6010.0*       ASSESSMENT AND PLAN:  Principal Problem:   STEMI (ST elevation myocardial infarction) Active Problems:   Essential hypertension   Chronic kidney disease   Chronic combined systolic and diastolic congestive heart failure   Weight loss   Atrial fibrillation with RVR   TIA (transient ischemic attack)   SOB (shortness of breath)   Early satiety   Cr stable thus far :) on ACE  Repeat pending  Reiterated the importance of Coumadin. INR pending ( not yet drawn)  Loss of rate control overnight>>still fast no obvious trigger  Continue dig and metoprolol and may need toadd dilt even with depressed EF later     Continue heparin until his Coumadin is therapeutic-anticipate 24 hour overlap      Signed, Virl Axe MD  10/17/2014

## 2014-10-17 NOTE — Progress Notes (Signed)
ANTICOAGULATION CONSULT NOTE - Follow Up Consult  Pharmacy Consult for Heparin, warfarin Indication: chest pain/ACS, a fib, VTE   Allergies  Allergen Reactions  . Digoxin And Related Other (See Comments)    Dig toxicity 2009  . Penicillins Rash    Patient Measurements: Height: '6\' 3"'  (190.5 cm) Weight: 155 lb 10.3 oz (70.6 kg) IBW/kg (Calculated) : 84.5  Heparin Dosing Weight: 70 kg  Vital Signs: Temp: 98.3 F (36.8 C) (05/30 0816) Temp Source: Oral (05/30 0410) BP: 129/98 mmHg (05/30 0938) Pulse Rate: 130 (05/30 0937)  Labs:  Recent Labs  10/15/14 0355 10/16/14 0350 10/17/14 0910  HGB 12.5* 13.3 12.9*  HCT 37.9* 39.8 39.2  PLT 131* 162 166  LABPROT 17.3* 19.3* 23.9*  INR 1.40 1.62* 2.16*  HEPARINUNFRC 0.33 0.44 0.56  CREATININE 1.25* 1.31*  --     Estimated Creatinine Clearance: 52.4 mL/min (by C-G formula based on Cr of 1.31).   Medical History: Past Medical History  Diagnosis Date  . Hypertension   . Chronic systolic heart failure     a. NICM - EF 35% with normal cors 2003. b. Last echo 11/2012 - EF 25-30%.  . Depression   . GERD (gastroesophageal reflux disease)   . Portal vein thrombosis   . Avascular necrosis of bones of both hips     Status post bilateral hip replacements  . Gastritis   . Alcohol abuse   . Erectile dysfunction   . Bipolar disorder   . Hydrocele, unspecified   . Intermittent claudication   . Lung nodule     Chest CT scan on 12/14/2008 showed a nodular opacity at the left lung base felt to most likely represent scarring.  Follow-up chest CT scan on 06/02/2010 showed parenchymal scarring in the left apex, left  lower lobe, and lingula; some of this scarring at the left base had a nodular appearance, unchanged. Chest 09/2012 - stable.  . 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  Thyroid scan 08/28/2009 was normal with no focal areas of abnormal increased or decreased activity seen; the uptake of  I 131 sodium iodide at 24 hours was 5.7%.  TSH and free T4 normalized by 08/16/2009.  . Intestinal obstruction   . COPD (chronic obstructive pulmonary disease)   . Clostridium difficile colitis   . E coli bacteremia   . DVT (deep venous thrombosis)     He has hypercoagulability with multiple prior DVTs  . Pleural plaque     H/o asbestos exposure. Chest CT on 06/02/2010 showed stable extensive calcified pleural plaques involving the left hemithorax, consistent with asbestos related pleural disease.  . Weight loss     Normal colonoscopy by Dr. Olevia Perches on 11/06/2010.  . Tachycardia-bradycardia syndrome     a. 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 b. [employer] gen change 2010.  [name] PAF (paroxysmal atrial fibrillation)     Paroxysmal, failed medical therapy with amiodarone but has done very well with multaq;  d/c 2/2 to recurrent AFib;  Tikosyn load c/b Hyperkalemia during admx 05/2013 => rate control strategy  . 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  . Noncompliance   . Atrial flutter   . Thrombocytopenia   . Hyperkalemia     type 4 RTA  . Pacemaker   . Shortness of breath     "all the time now" (07/02/2013)  . On home  oxygen therapy     "3L prn; need it maybe 3-4 times/wk" (10/11/2014)  . Pneumonia   . Cluster headaches     "~ twice/wk" (07/02/2013)  . Osteoarthritis   . Spondylosis   . Arthritis     "hips and legs" (07/02/2013)  . Chronic back pain     "all over my back" (07/02/2013)  . Anxiety   . CKD (chronic kidney disease)     Renal U/S 12/04/2009 showed no pathological findings. Labs 12/04/2009 include normal ESR, C3, C4; neg ANA; SPEP showed nonspecific increase in the alpha-2 region with no M-spike; UPEP showed no monoclonal free light chains; urine IFE showed polyclonal increase in feree Kappa and/or free Lambda light chains. Baseline Cr reported 1.7-2.5.  . STEMI (ST elevation  myocardial infarction) 10/11/2014  . CHF (congestive heart failure)     Medications:  Heparin infusion at 1350 units/hr  Assessment: 69 y.o. male on Coumadin PTA (noncompliant) for h/o DVT and PAF presented with new LLE arterial thrombus s/p L femoral thrombectomy on IV heparin.  Heparin level this AM remains therapeutic at 0.56. INR now therapeuticat 2.1. hgb trend stable. No bleeding issues noted. Per Klein's note plan 24 hour overlap with therapeutic coumadin/heparin.  Patient reports non-compliance to warfarin PTA, but reportedly was supposed to be on alternating doses of 5 mg and 7.5 mg (last outpatient coumadin note in 04/2014 stated dose was 7.5 mg daily and INR was therapeutic at that visit)  Goal of Therapy:  Heparin level 0.3-0.7 units/ml  INR 2-3 Monitor platelets by anticoagulation protocol: Yes   Plan:  Continue heparin at 1350 units/hr Coumadin 55m PO x 1 tonight Continue daily heparin level, CBC, and INR D/c heparin in am if INR >2  FErin HearingPharmD., BCPS Clinical Pharmacist Pager [phone]/30/2016 11:36 AM

## 2014-10-18 ENCOUNTER — Encounter (HOSPITAL_COMMUNITY): Payer: Self-pay | Admitting: Nurse Practitioner

## 2014-10-18 DIAGNOSIS — J449 Chronic obstructive pulmonary disease, unspecified: Secondary | ICD-10-CM | POA: Diagnosis present

## 2014-10-18 LAB — CBC
HEMATOCRIT: 38.8 % — AB (ref 39.0–52.0)
Hemoglobin: 12.7 g/dL — ABNORMAL LOW (ref 13.0–17.0)
MCH: 29.7 pg (ref 26.0–34.0)
MCHC: 32.7 g/dL (ref 30.0–36.0)
MCV: 90.7 fL (ref 78.0–100.0)
Platelets: 181 10*3/uL (ref 150–400)
RBC: 4.28 MIL/uL (ref 4.22–5.81)
RDW: 14.1 % (ref 11.5–15.5)
WBC: 8.7 10*3/uL (ref 4.0–10.5)

## 2014-10-18 LAB — HEPARIN LEVEL (UNFRACTIONATED): HEPARIN UNFRACTIONATED: 0.49 [IU]/mL (ref 0.30–0.70)

## 2014-10-18 LAB — PROTIME-INR
INR: 2.1 — AB (ref 0.00–1.49)
Prothrombin Time: 23.4 seconds — ABNORMAL HIGH (ref 11.6–15.2)

## 2014-10-18 MED ORDER — METOPROLOL TARTRATE 75 MG PO TABS: 75.0000 mg | ORAL_TABLET | Freq: Three times a day (TID) | ORAL | Status: DC

## 2014-10-18 MED ORDER — WARFARIN SODIUM 2.5 MG PO TABS: 5.0000 mg | ORAL_TABLET | ORAL | Status: DC

## 2014-10-18 MED ORDER — METOPROLOL TARTRATE 75 MG PO TABS: 25.0000 mg | ORAL_TABLET | Freq: Three times a day (TID) | ORAL | Status: DC

## 2014-10-18 MED ORDER — ROSUVASTATIN CALCIUM 20 MG PO TABS: 20.0000 mg | ORAL_TABLET | Freq: Every day | ORAL | Status: DC

## 2014-10-18 MED ORDER — RAMIPRIL 2.5 MG PO CAPS: 2.5000 mg | ORAL_CAPSULE | Freq: Two times a day (BID) | ORAL | Status: DC

## 2014-10-18 MED ORDER — DIGOXIN 62.5 MCG PO TABS: 0.0625 mg | ORAL_TABLET | Freq: Every day | ORAL | Status: DC

## 2014-10-18 MED ORDER — WARFARIN SODIUM 7.5 MG PO TABS
7.5000 mg | ORAL_TABLET | Freq: Once | ORAL | Status: AC
  Administered 2014-10-18: 7.5 mg via ORAL
  Filled 2014-10-18: qty 1

## 2014-10-18 MED ORDER — ASPIRIN 81 MG PO TBEC: 81.0000 mg | DELAYED_RELEASE_TABLET | Freq: Every day | ORAL | Status: DC

## 2014-10-18 NOTE — Clinical Documentation Improvement (Signed)
"  Severe malnutrition in context of chronic illness" is dogumented by Registered Dietician 10/13/14 as well as BMI 19.15. Unexplained weightloss currently documented in physician progress notes.   If you agree with severe malnutrition, please document in your progress note and carry over to the discharge summary.    Possible Clinical Conditions: -Severe malnutrition  In context of chronic illness -Malnutrition, other severity (please specify) -Other condition -Unable to determine at present  Thank you, Mateo Flow, RN [phone] Clinical Documentation Specialist

## 2014-10-18 NOTE — Progress Notes (Addendum)
Subjective:  Pt seen and examined in AM. No acute events overnight. His INR today is 2.10 and yesterday was 2.16. He continues to be tachycardic in the 110's. He denies chest pain, dyspnea, palpitations, or lightheadedness from baseline.     Objective: Vital signs in last 24 hours: Filed Vitals:   10/18/14 0833 10/18/14 0940 10/18/14 1013 10/18/14 1014  BP: 117/76  113/94   Pulse: 111 98  113  Temp:      TempSrc:      Resp:  18    Height:      Weight:      SpO2:  100%     Weight change: -9 lb 11.2 oz (-4.4 kg)  Intake/Output Summary (Last 24 hours) at 10/18/14 1127 Last data filed at 10/18/14 0500  Gross per 24 hour  Intake    444 ml  Output   1350 ml  Net   -906 ml   PHYSICAL EXAMINATION:  General: Thin appearing, NAD Heart: Tachycardic, irregularly irregular rhythm  Lungs: Increased BS, no ronchi, wheezing, or rales Abdomen: Soft, non-tender, non-distended, normal BS Extremities: No edema      Lab Results: Basic Metabolic Panel:  Recent Labs Lab 10/12/14 0426 10/13/14 0341  10/15/14 0355 10/16/14 0350  NA 141  --   < > 139 140  K 4.5  --   < > 4.2 4.1  CL 107  --   < > 109 108  CO2 24  --   < > 21* 26  GLUCOSE 85  --   < > 88 93  BUN 15  --   < > 14 14  CREATININE 1.50*  --   < > 1.25* 1.31*  CALCIUM 8.7*  --   < > 8.7* 9.3  MG 1.8 1.9  --   --   --   < > = values in this interval not displayed. Liver Function Tests:  Recent Labs Lab 10/11/14 1205 10/12/14 0426  AST 72* 57*  ALT 15* 17  ALKPHOS 76 66  BILITOT 1.2 1.2  PROT 7.2 5.8*  ALBUMIN 3.7 3.0*   CBC:  Recent Labs Lab 10/11/14 1205  10/17/14 0910 10/18/14 0309  WBC 7.8  < > 7.9 8.7  NEUTROABS 5.4  --   --   --   HGB 14.6  < > 12.9* 12.7*  HCT 43.6  < > 39.2 38.8*  MCV 90.8  < > 91.0 90.7  PLT 131*  < > 166 181  < > = values in this interval not displayed. Cardiac Enzymes:  Recent Labs Lab 10/11/14 1903 10/11/14 2136 10/12/14 0426  TROPONINI 17.01* 16.22* 13.21*    CBG:  Recent Labs Lab 10/12/14 1314  GLUCAP 138*   Hemoglobin A1C:  Recent Labs Lab 10/11/14 1526  HGBA1C 5.6   Fasting Lipid Panel:  Recent Labs Lab 10/12/14 0426  CHOL 92  HDL 36*  LDLCALC 50  TRIG 31  CHOLHDL 2.6   Thyroid Function Tests:  Recent Labs Lab 10/11/14 2136  TSH 2.031   Coagulation:  Recent Labs Lab 10/15/14 0355 10/16/14 0350 10/17/14 0910 10/18/14 0309  LABPROT 17.3* 19.3* 23.9* 23.4*  INR 1.40 1.62* 2.16* 2.10*   Anemia Panel: No results for input(s): VITAMINB12, FOLATE, FERRITIN, TIBC, IRON, RETICCTPCT in the last 168 hours. Urine Drug Screen: Drugs of Abuse     Component Value Date/Time   LABOPIA NONE DETECTED 10/11/2014 1724   LABOPIA NEGATIVE 12/27/2008 1943   COCAINSCRNUR NONE DETECTED 10/11/2014 1724  COCAINSCRNUR NEG 07/06/2009 2225   LABBENZ NONE DETECTED 10/11/2014 1724   LABBENZ NEG 07/06/2009 2225   AMPHETMU NONE DETECTED 10/11/2014 1724   AMPHETMU NEG 07/06/2009 2225   THCU POSITIVE* 10/11/2014 1724   LABBARB NONE DETECTED 10/11/2014 1724    Alcohol Level:  Recent Labs Lab 10/11/14 1305  ETH <5   Urinalysis:  Recent Labs Lab 10/11/14 1724  [place]  LABSPEC 1.010  PHURINE 7.0  GLUCOSEU NEGATIVE  HGBUR MODERATE*  BILIRUBINUR NEGATIVE  KETONESUR NEGATIVE  PROTEINUR NEGATIVE  UROBILINOGEN 1.0  NITRITE NEGATIVE  LEUKOCYTESUR MODERATE*    Micro Results: Recent Results (from the past 240 hour(s))  MRSA PCR Screening     Status: None   Collection Time: 10/11/14  5:29 PM  Result Value Ref Range Status   MRSA by PCR NEGATIVE NEGATIVE Final    Comment:        The GeneXpert MRSA Assay (FDA approved for NASAL specimens only), is one component of a comprehensive MRSA colonization surveillance program. It is not intended to diagnose MRSA infection nor to guide or monitor treatment for MRSA infections.   Surgical pcr screen     Status: Abnormal   Collection Time: 10/12/14  8:51 AM   Result Value Ref Range Status   MRSA, PCR NEGATIVE NEGATIVE Final   Staphylococcus aureus POSITIVE (A) NEGATIVE Final    Comment:        The Xpert SA Assay (FDA approved for NASAL specimens in patients over 3 years of age), is one component of a comprehensive surveillance program.  Test performance has been validated by [facility] for patients greater than or equal to 58 year old. It is not intended to diagnose infection nor to guide or monitor treatment.    Studies/Results: No results found. Medications: I have reviewed the patient's current medications. Scheduled Meds: . aspirin EC  81 mg Oral Daily  . buPROPion  75 mg Oral BID  . digoxin  0.0625 mg Oral Daily  . feeding supplement (ENSURE ENLIVE)  237 mL Oral TID BM  . metoprolol  75 mg Oral Q8H  . pantoprazole  40 mg Oral Daily  . ramipril  2.5 mg Oral BID  . rosuvastatin  20 mg Oral q1800  . tamsulosin  0.4 mg Oral Daily  . tiotropium  18 mcg Inhalation Daily  . warfarin  7.5 mg Oral ONCE-1800  . Warfarin - Pharmacist Dosing Inpatient   Does not apply q1800   Continuous Infusions:  PRN Meds:.diphenhydrAMINE-zinc acetate, ipratropium-albuterol, metoprolol, oxyCODONE-acetaminophen, senna-docusate Assessment/Plan:  Persistent Afib with RVR complicated by thromboembolism (STEMI, TIA, and left LE s/p embolectomy) due to nonadherence - Currently tachycardic in 110's with irregularly irregular rhythm. INR therapeutic for past 24 hr's (2.16). Pt with CHADS2 score of 4 requiring life-long AC therapy.   -Appreciate cardiology recommendations  -Discontinue IV heparin and continue coumadin 5 mg /7.5 mg (today) daily with INR check on Friday with  Dr. Maudie Mercury -Continue digoxin 0.0625 mg daily for rate control, will need dig level checked at f/u visit due to history of toxicity  -Continue metoprolol 25 mg TID for rate control -Avoid CCB in setting of reduced EF  -Per Dr. Meda Coffee, pt will need possible AVN ablation in setting of  pacemaker due to maximal medical therapy -Will need outpatient NM stress testing and repeat 2D-echo (3 months) -Continue aspirin 81 mg daily in setting of recent STEMI  -F/u with vascular surgery Dr. Donnetta Hutching next week and cardiology follow-up  Hypertension - Currently normotensive  -  Continue metoprolol 25 mg and ramipril 2.5 mg BID  Chronic Systolic CHF - Currently euvolemic.  -Continue metoprolol 25 mg and ramipril 2.5 mg BID -Will need repeat 2D-echo (3 months)  Hyperlipidemia - Last lipid panel on 10/12/14 was normal.  -Continue rosuvastatin 20 mg daily   BPH - Currently stable. -Continue flomax 0.4 mg daily  -Monitor for orthostatic hypotension    Depression - Currently with stable mood -Continue wellbutrin 75 mg BID   GERD - Currently without acid reflux symptoms.  -Continue protonix 40 mg daily   Diet: Regular DVT Ppx: Coumadin  Code: Full    Dispo: Disposition is deferred at this time, awaiting improvement of current medical problems.  Anticipated discharge is today  The patient does have a current PCP Bertha Stakes, MD) and does need an [facility] hospital follow-up appointment after discharge.  The patient does not have transportation limitations that hinder transportation to clinic appointments.  .Services Needed at time of discharge: Y = Yes, Blank = No PT:  No  OT:  No  RN:  Yes  Equipment:  None  Other:  None     LOS: 7 days   Juluis Mire, MD 10/18/2014, 11:27 AM

## 2014-10-18 NOTE — Consult Note (Signed)
   [facility] Inpatient Consult   10/18/2014  [name] 02-16-1946 [id number] Referral received for restart of services.  Patient had been seen by [facility] last year. Patient evaluated for community based chronic disease management services with [facility] as a benefit of patient's [employer]. Spoke with patient at bedside to explain [facility] services and the differences with Home health services. Patient verbalized understanding.  Patient states that he has moved with his son who is a truckdriver. Patient's daughter is supportive but does not drive and she home schools her child. Patient has some medication management needs as well.   Patient will receive post discharge follow up calls and will be evaluated for monthly home visits for assessments and disease process education. Consent signed.   Left contact information and [facility] literature at bedside. Made Inpatient Case Manager aware that [facility] following. Of note, [facility] Care Management services does not replace or interfere with any services that are arranged by inpatient case management or social work.  For additional questions or referrals please contact:   Natividad Brood, RN BSN [facility] Liaison  [phone] business mobile phone

## 2014-10-18 NOTE — Progress Notes (Signed)
PT Cancellation Note  Patient Details Name: KENITH TRICKEL MRN: [id number] DOB: [birth year]   Cancelled Treatment:    Reason Eval/Treat Not Completed: Medical issues which prohibited therapy (per RN, wants to get clarification for mobility with new oozing from  surg site. Patient also declined PT today , angry about not going home. )   Claretha Cooper 10/18/2014, 11:15 AM Tresa Endo PT [phone]

## 2014-10-18 NOTE — Progress Notes (Signed)
  Date: 10/18/2014  Patient name: [name]  Medical record number: [id number]  Date of birth: [birth year]   This patient's plan of care was discussed with the house staff. Please see Dr. Harley Hallmark note for complete details. I concur with her findings.  [name] now has a therapeutic INR and our team has discussed need for compliance given his complicated clotting on admission.  His HR continues to be elevated.  I have reviewed Dr. Francesca Oman note from Cardiology and medical options are limited.  He needs to follow closely with Cards outpatient for this issue.  He can be discharged today on current therapy.    Sid Falcon, MD 10/18/2014, 2:01 PM

## 2014-10-18 NOTE — Progress Notes (Signed)
Patient Name: [name] Date of Encounter: 10/18/2014  Primary Cardiologist: Dr Allred/Dr Harrington Challenger    Principal Problem:   STEMI (ST elevation myocardial infarction) Active Problems:   Essential hypertension   Chronic kidney disease   Chronic combined systolic and diastolic congestive heart failure   Weight loss   Atrial fibrillation with RVR   TIA (transient ischemic attack)   SOB (shortness of breath)   Early satiety    SUBJECTIVE  Denies any CP or SOB. Some L groin bleed last night. Good L leg pulse. Angry this morning with changing MD. Also became angry after told the patient we can potentially stop IV heparin today after 24 hours overlap and will discuss with MD regarding HR control, he states this is not what he was told yesterday and states he was told he could stop IV heparin today and discharged.   CURRENT MEDS . aspirin EC  81 mg Oral Daily  . buPROPion  75 mg Oral BID  . digoxin  0.0625 mg Oral Daily  . feeding supplement (ENSURE ENLIVE)  237 mL Oral TID BM  . metoprolol  75 mg Oral Q8H  . pantoprazole  40 mg Oral Daily  . ramipril  2.5 mg Oral BID  . rosuvastatin  20 mg Oral q1800  . tamsulosin  0.4 mg Oral Daily  . tiotropium  18 mcg Inhalation Daily  . Warfarin - Pharmacist Dosing Inpatient   Does not apply q1800    OBJECTIVE  Filed Vitals:   10/18/14 0556 10/18/14 0724 10/18/14 0746 10/18/14 0833  BP: 134/78 87/66 110/87 117/76  Pulse: 113 144  111  Temp:  98.3 F (36.8 C)    TempSrc:  Oral    Resp: 16 19    Height:      Weight:      SpO2:  100%      Intake/Output Summary (Last 24 hours) at 10/18/14 0853 Last data filed at 10/18/14 0500  Gross per 24 hour  Intake    684 ml  Output   1350 ml  Net   -666 ml   Filed Weights   10/16/14 0400 10/17/14 0600 10/18/14 0500  Weight: 146 lb (66.225 kg) 155 lb 10.3 oz (70.6 kg) 145 lb 15.1 oz (66.2 kg)    PHYSICAL EXAM  General: Pleasant, NAD. Neuro: Alert and oriented X 3. Moves all  extremities spontaneously. Psych: Normal affect. HEENT:  Normal  Neck: Supple without bruits or JVD. Lungs:  Resp regular and unlabored, anterior exam CTA. Heart: tachycardic, irregular. no s3, s4, or murmurs. Abdomen: Soft, non-tender, non-distended, BS + x 4.  Extremities: No clubbing, cyanosis or edema. DP/PT/Radials 2+ and equal bilaterally.  Accessory Clinical Findings  CBC  Recent Labs  10/17/14 0910 10/18/14 0309  WBC 7.9 8.7  HGB 12.9* 12.7*  HCT 39.2 38.8*  MCV 91.0 90.7  PLT 166 497   Basic Metabolic Panel  Recent Labs  10/16/14 0350  NA 140  K 4.1  CL 108  CO2 26  GLUCOSE 93  BUN 14  CREATININE 1.31*  CALCIUM 9.3    TELE A-fib with HR 120s    ECG  No new EKG  Echocardiogram 10/11/2014  LV EF: 30% -  35%  ------------------------------------------------------------------- Indications:   CVA 436.  ------------------------------------------------------------------- History:  PMH: Sick-Sinus Syndrome. Non-Ischemic Cardiomyopathy. Alcohol Abuse. Dyspnea. Atrial fibrillation. Risk factors: Current tobacco use.  ------------------------------------------------------------------- Study Conclusions  - Left ventricle: The cavity size was normal. Systolic function was moderately to severely  reduced. The estimated ejection fraction was in the range of 30% to 35%. Diffuse hypokinesis. - Aortic valve: There was mild regurgitation. - Right ventricle: The cavity size was mildly dilated. Wall thickness was normal. Systolic function was moderately reduced. - Right atrium: The atrium was mildly dilated.  Impressions:  - No cardiac source of emboli was indentified.     Radiology/Studies  Ct Head Wo Contrast  10/11/2014   CLINICAL DATA:  Code stroke. RIGHT-sided facial droop with slurred speech.  EXAM: CT HEAD WITHOUT CONTRAST  TECHNIQUE: Contiguous axial images were obtained from the base of the skull through the vertex without  intravenous contrast.  COMPARISON:  CT head 10/12/2012.  FINDINGS: No evidence for acute infarction, hemorrhage, mass lesion, hydrocephalus, or extra-axial fluid. Normal for age cerebral volume. No white matter disease. Calvarium intact. No sinus or mastoid fluid.  IMPRESSION: Negative exam.  No change from priors.  Critical Value/emergent results were called by telephone at the time of interpretation on 10/11/2014 at 1:12 pm to Dr. Janann Colonel , who verbally acknowledged these results.   Electronically Signed   By: Rolla Flatten M.D.   On: 10/11/2014 13:14   Ct Chest W Contrast  10/13/2014   CLINICAL DATA:  69 year old male with unexplained weight loss. Patient with myocardial infarction, CVA, multiple arch O emboli. Acute on chronic kidney disease.  EXAM: CT CHEST, ABDOMEN, AND PELVIS WITH CONTRAST  TECHNIQUE: Multidetector CT imaging of the chest, abdomen and pelvis was performed following the standard protocol during bolus administration of intravenous contrast.  CONTRAST:  61mL OMNIPAQUE IOHEXOL 300 MG/ML  SOLN  COMPARISON:  06/02/2013 and prior chest CTs  FINDINGS: CT CHEST FINDINGS  Mediastinum/Nodes: Cardiomegaly again noted. Moderate coronary artery calcifications are identified. The ascending aorta measures 3.7 cm and greatest diameter. No enlarged lymph nodes are identified. There is no evidence of pericardial effusion.  Lungs/Pleura: Dependent and bibasilar atelectasis/ scarring again noted. Scarring within the mid and lower left lung again identified. Patchy ground-glass opacities within the left upper lobe may represent aspiration or for pneumonia. There is no evidence of suspicious mass or nodule. COPD/emphysema again identified. Left posterior pleural calcifications are again identified.  Musculoskeletal: No acute or suspicious abnormalities.  CT ABDOMEN AND PELVIS FINDINGS  Hepatobiliary: The liver and gallbladder are unremarkable. There is no evidence of biliary dilatation.  Pancreas: Unremarkable   Spleen: Unremarkable  Adrenals/Urinary Tract: Moderate bilateral renal atrophy and cortical thinning noted, right greater than left. There is no evidence of hydronephrosis, renal mass or definite renal calculi.  Stomach/Bowel: There is no evidence of bowel obstruction. No definite bowel wall thickening noted. No dilated bowel loops are present. Hip replacement artifact obscures detail within the pelvis.  Vascular/Lymphatic: No enlarged lymph nodes or abdominal aortic aneurysm identified. Calcifications within the IVC, iliac and femoral veins noted.  Reproductive: Prostate not well visualized.  Other: No free fluid, abscess or pneumoperitoneum.  Musculoskeletal: Mild stranding in the left inguinal region is likely postoperative. Bilateral hip replacements identified. No acute bony abnormalities are identified. Degenerative changes in the lumbar spine and bilateral L5 pars defects again noted.  IMPRESSION: No evidence of primary malignancy.  Patchy left upper lobe ground-glass opacities - possible aspiration versus pneumonia.  Cardiomegaly, coronary artery disease, ectatic ascending thoracic aorta, COPD/emphysema and moderate bilateral renal atrophy.   Electronically Signed   By: Margarette Canada M.D.   On: 10/13/2014 14:10   Ct Abdomen Pelvis W Contrast  10/13/2014   CLINICAL DATA:  69 year old male with unexplained weight  loss. Patient with myocardial infarction, CVA, multiple arch O emboli. Acute on chronic kidney disease.  EXAM: CT CHEST, ABDOMEN, AND PELVIS WITH CONTRAST  TECHNIQUE: Multidetector CT imaging of the chest, abdomen and pelvis was performed following the standard protocol during bolus administration of intravenous contrast.  CONTRAST:  76mL OMNIPAQUE IOHEXOL 300 MG/ML  SOLN  COMPARISON:  06/02/2013 and prior chest CTs  FINDINGS: CT CHEST FINDINGS  Mediastinum/Nodes: Cardiomegaly again noted. Moderate coronary artery calcifications are identified. The ascending aorta measures 3.7 cm and greatest  diameter. No enlarged lymph nodes are identified. There is no evidence of pericardial effusion.  Lungs/Pleura: Dependent and bibasilar atelectasis/ scarring again noted. Scarring within the mid and lower left lung again identified. Patchy ground-glass opacities within the left upper lobe may represent aspiration or for pneumonia. There is no evidence of suspicious mass or nodule. COPD/emphysema again identified. Left posterior pleural calcifications are again identified.  Musculoskeletal: No acute or suspicious abnormalities.  CT ABDOMEN AND PELVIS FINDINGS  Hepatobiliary: The liver and gallbladder are unremarkable. There is no evidence of biliary dilatation.  Pancreas: Unremarkable  Spleen: Unremarkable  Adrenals/Urinary Tract: Moderate bilateral renal atrophy and cortical thinning noted, right greater than left. There is no evidence of hydronephrosis, renal mass or definite renal calculi.  Stomach/Bowel: There is no evidence of bowel obstruction. No definite bowel wall thickening noted. No dilated bowel loops are present. Hip replacement artifact obscures detail within the pelvis.  Vascular/Lymphatic: No enlarged lymph nodes or abdominal aortic aneurysm identified. Calcifications within the IVC, iliac and femoral veins noted.  Reproductive: Prostate not well visualized.  Other: No free fluid, abscess or pneumoperitoneum.  Musculoskeletal: Mild stranding in the left inguinal region is likely postoperative. Bilateral hip replacements identified. No acute bony abnormalities are identified. Degenerative changes in the lumbar spine and bilateral L5 pars defects again noted.  IMPRESSION: No evidence of primary malignancy.  Patchy left upper lobe ground-glass opacities - possible aspiration versus pneumonia.  Cardiomegaly, coronary artery disease, ectatic ascending thoracic aorta, COPD/emphysema and moderate bilateral renal atrophy.   Electronically Signed   By: Margarette Canada M.D.   On: 10/13/2014 14:10   Dg Chest Port 1  View  10/11/2014   CLINICAL DATA:  Shortness of breath.  EXAM: PORTABLE CHEST - 1 VIEW  COMPARISON:  10/11/2014 and 01/17/2014  FINDINGS: Right-sided pacemaker unchanged. Lungs are adequately inflated with stable chronic elevation of the left hemidiaphragm and stable chronic changes over the left lung. Known calcified pleural plaques. No focal lobar consolidation or effusion cardiomediastinal silhouette and remainder of the exam is unchanged.  IMPRESSION: No active disease.  Chronic stable changes of the left lung. Evidence of asbestos related pleural disease.   Electronically Signed   By: Marin Olp M.D.   On: 10/11/2014 21:56   Dg Chest Portable 1 View  10/11/2014   CLINICAL DATA:  Right facial droop  EXAM: PORTABLE CHEST - 1 VIEW  COMPARISON:  01/17/2014  FINDINGS: Cardiac shadow is again mildly enlarged. A pacing device is again seen. Chronic left pleural effusion and left basilar changes are noted although they have increased in the interval from the prior exam. The right lung remains clear. The visualized upper abdomen is unremarkable.  IMPRESSION: Increasing chronic changes in the left lung base when compare with the prior exam. Calcified pleural plaques are again noted.   Electronically Signed   By: Inez Catalina M.D.   On: 10/11/2014 14:06   Dg Ang/ext/uni/or Left  10/12/2014   CLINICAL DATA:  Femoral  thrombectomy for embolic occlusion in the left lower extremity.  EXAM: LEFT ANG/EXT/UNI/ OR  COMPARISON:  None.  FINDINGS: Intraoperative image shows a normally patent popliteal artery and normally patent visualized anterior tibial, peroneal and posterior tibial arteries to nearly the level of the ankle. No filling defects are identified.  IMPRESSION: No distal occlusions or filling defects identified at the level of the popliteal and tibial arteries.   Electronically Signed   By: Aletta Edouard M.D.   On: 10/12/2014 14:02    ASSESSMENT AND PLAN  69 yo male with PMH of hypertension, chronic  systolic HF, bipolar disorder, hyperthyroidism, multiple DVTs, PAF on coumadin (failed amiodarone, multaq, tiksyn --> rate control strategy), portal vein thrombosys and noncompliance present with STEMI, ischemic leg and TIA which felt to be due to emboli from a-fib after noncompliance with coumadin  1. STEMI  - likely 2/2 emboli from not taking coumadin as he also had TIA and ischemic L leg. Did not cath as he was chest pain free and trop trending down.   - will need nuclear study for risk stratification as outpatient once recover from leg surgery. Repeat echo in 3 month to see if as any LV function improvement.    2. Chronic A-fib with RVR  - (CHADSVASc score = 4)  - need life long systemic anticoagulation  - INR therapeutic after overlap with heparin. Rate became uncontrolled, started digoxin. Metoprolol increased to 75mg  q8HR  - HR still uncontrolled despite increased dose of metoprolol. INR therapeutic after 24 hr overlap, talked with patient potentially stop heparin and will discuss with MD regarding rate control before discharge, patient became angry and said that is not what he was told yesterday.    3. Left leg embolic event s/p L embolectomy by vascular surgery  4. TIA  5. Chronic combined systolic and diastolic HF  - Echo 8/59/2924 EF 45-50%, severely dilated biatrial size, moderate TR  - Echo 10/11/2014 EF 30-35%, moderately reduced RVEF  6. HTN 7. CKD  Signed, Woodward Ku Pager: [phone]   The patient was seen, examined and discussed with Almyra Deforest, PA-C and I agree with the above.   INR is therapeutic today, we will discontinue heparin infusion especially that he had a minor bleeding from the left groin at the thrombectomy access site. Peripheral pulses are great.  The patient continues to be in a-fib with RVR, he is on maximum tolerated dose of metoprolol 25 mg po Q8H and Digoxin 0.0625, we are unable to increase as he has known toxicity with Digoxin and known  intolerance to amiodarone, flecainide. Dr Caryl Comes suggested possible AVN ablation as he has a PM in place. However, this is only an option if he follows up in our clinic as he has known chronic non-compliance problems.  Discharge today if his home help issues are resolved.  Dorothy Spark 10/18/2014

## 2014-10-18 NOTE — Progress Notes (Signed)
Small amount of bleeding noted from L groin surgical site. Area cleansed and pressure dressing applied

## 2014-10-18 NOTE — Progress Notes (Signed)
OT Cancellation Note  Patient Details Name: NAI DASCH MRN: [id number] DOB: 04-12-1946   Cancelled Treatment:    Reason Eval/Treat Not Completed: Medical issues which prohibited therapy RN requested OT hold off on eval until clarification received on mobility given new oozing from surgical site.  Will check back.  Darlina Rumpf [place], OTR/L [phone]  10/18/2014, 1:02 PM

## 2014-10-18 NOTE — Care Management Note (Addendum)
Case Management Note  Patient Details  Name: [name] MRN: [id number] Date of Birth: [birth year]  Subjective/Objective: Pt admitted for slurred speech, facial droop, dysarthria. Increased troponin.    Action/Plan: 10-18-14 1116 CM did speak with pt in regards to disposition. Pt has a son that lives with him, however the son works during the day. Per PT pt will not need PT f/u @ home and will need supervision intermittently. Pt states he has life alert if anything was to happen. Pt states he is active with [facility]. CM did call Liaison to verify. Pt has been contacted by [facility] in the past- per pt only phone calls. The Liaison stated they were trying to get him assistance via the [place]. Consult placed for [facility] to speak with pt. Pt will need HHRN and Aide order for home. CM will continue to monitor for disposition needs.   Expected Discharge Date:                  Expected Discharge Plan:  [place]  In-House Referral:     Discharge planning Services  CM Consult  Post Acute Care Choice:    Choice offered to:  Patient  DME Arranged:    DME Agency:     HH Arranged:  RN, Nurse's Aide [place] Agency:  [facility]  Status of Service:  Completed, signed off  Medicare Important Message Given:  Yes Date Medicare IM Given:  10/18/14 Medicare IM give by:  Jacqlyn Krauss, RN,BSN  Date Additional Medicare IM Given:    Additional Medicare Important Message give by:     If discussed at [place] of Stay Meetings, dates discussed:    Additional Comments:  Bethena Roys, RN 10/18/2014, 11:41 AM

## 2014-10-18 NOTE — Progress Notes (Signed)
ANTICOAGULATION CONSULT NOTE - Follow Up Consult  Pharmacy Consult for Heparin, warfarin Indication: chest pain/ACS, a fib, VTE   Allergies  Allergen Reactions  . Digoxin And Related Other (See Comments)    Dig toxicity 2009  . Penicillins Rash    Patient Measurements: Height: _0  (190.5 cm) Weight: 145 lb 15.1 oz (66.2 kg) IBW/kg (Calculated) : 84.5  Heparin Dosing Weight: 70 kg  Vital Signs: Temp: 98.3 F (36.8 C) (05/31 0724) Temp Source: Oral (05/31 0724) BP: 113/94 mmHg (05/31 1013) Pulse Rate: 113 (05/31 1014)  Labs:  Recent Labs  10/16/14 0350 10/17/14 0910 10/18/14 0309  HGB 13.3 12.9* 12.7*  HCT 39.8 39.2 38.8*  PLT 162 166 181  LABPROT 19.3* 23.9* 23.4*  INR 1.62* 2.16* 2.10*  HEPARINUNFRC 0.44 0.56 0.49  CREATININE 1.31*  --   --     Estimated Creatinine Clearance: 49.1 mL/min (by C-G formula based on Cr of 1.31).   Medical History: Past Medical History  Diagnosis Date  . Hypertension   . Chronic systolic heart failure     a. NICM - EF 35% with normal cors 2003. b. Last echo 11/2012 - EF 25-30%.  . Depression   . GERD (gastroesophageal reflux disease)   . Portal vein thrombosis   . Avascular necrosis of bones of both hips     Status post bilateral hip replacements  . Gastritis   . Alcohol abuse   . Erectile dysfunction   . Bipolar disorder   . Hydrocele, unspecified   . Intermittent claudication   . Lung nodule     Chest CT scan on 12/14/2008 showed a nodular opacity at the left lung base felt to most likely represent scarring.  Follow-up chest CT scan on 06/02/2010 showed parenchymal scarring in the left apex, left  lower lobe, and lingula; some of this scarring at the left base had a nodular appearance, unchanged. Chest 09/2012 - stable.  . 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  Thyroid scan 08/28/2009 was normal with no focal areas of abnormal increased or decreased activity seen; the uptake of  I 131 sodium iodide at 24 hours was 5.7%.  TSH and free T4 normalized by 08/16/2009.  . Intestinal obstruction   . COPD (chronic obstructive pulmonary disease)   . Clostridium difficile colitis   . E coli bacteremia   . DVT (deep venous thrombosis)     He has hypercoagulability with multiple prior DVTs  . Pleural plaque     H/o asbestos exposure. Chest CT on 06/02/2010 showed stable extensive calcified pleural plaques involving the left hemithorax, consistent with asbestos related pleural disease.  . Weight loss     Normal colonoscopy by Dr. Olevia Perches on 11/06/2010.  . Tachycardia-bradycardia syndrome     a. 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 b. [employer] Jude gen change 2010.  [name] PAF (paroxysmal atrial fibrillation)     Paroxysmal, failed medical therapy with amiodarone but has done very well with multaq;  d/c 2/2 to recurrent AFib;  Tikosyn load c/b Hyperkalemia during admx 05/2013 => rate control strategy  . 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  . Noncompliance   . Atrial flutter   . Thrombocytopenia   . Hyperkalemia     type 4 RTA  . Pacemaker   . Shortness of breath     "all the time now" (07/02/2013)  .  On home oxygen therapy     "3L prn; need it maybe 3-4 times/wk" (10/11/2014)  . Pneumonia   . Cluster headaches     "~ twice/wk" (07/02/2013)  . Osteoarthritis   . Spondylosis   . Arthritis     "hips and legs" (07/02/2013)  . Chronic back pain     "all over my back" (07/02/2013)  . Anxiety   . CKD (chronic kidney disease)     Renal U/S 12/04/2009 showed no pathological findings. Labs 12/04/2009 include normal ESR, C3, C4; neg ANA; SPEP showed nonspecific increase in the alpha-2 region with no M-spike; UPEP showed no monoclonal free light chains; urine IFE showed polyclonal increase in feree Kappa and/or free Lambda light chains. Baseline Cr reported 1.7-2.5.  . STEMI (ST elevation  myocardial infarction) 10/11/2014  . CHF (congestive heart failure)     Medications:  Heparin infusion at 1350 units/hr  Assessment: 69 y.o. male on Coumadin PTA (noncompliant) for h/o DVT and PAF presented with new LLE arterial thrombus s/p L femoral thrombectomy on IV heparin. On coumadin PTA.   Chronic A-fib with RVR, CHADSVASc score = 4, cardiology notes patient needs life long systemic anticoagulation  Heparin level this AM remains therapeutic at 0.49 on IV heparin 1350 units/hr. INR is  Therapeutic x >24 hours with INR = 2.1 this morning. Hgb 12.7 and PLTC 181, trend stable. No bleeding issues noted. Per Dr. Olin Pia note yesterday 5/30, plan 24 hour overlap with therapeutic coumadin/heparin. Expect MD will DC IV heparin today as INR remains therapeutic >24 hours. Patient has received 7.67m daily x 4 days then 534mgiven yesterday.  INR 2.16 yesterday, INR 2.1 today.   Patient reported non-compliance to warfarin PTA, but reportedly was supposed to be on alternating doses of 5 mg and 7.5 mg (last outpatient coumadin note in 04/2014 stated dose was 7.5 mg daily and INR was therapeutic at that visit)  Goal of Therapy:  Heparin level 0.3-0.7 units/ml  INR 2-3 Monitor platelets by anticoagulation protocol: Yes   Plan:  Continue heparin at 1350 units/hr Coumadin 7.21m24mO x 1 tonight.  Recommend discharge dose of coumadin 7.21mg22mternating with 5 mg, alternating daily. Recommend to check INR end of this week if discharged today. If heparin continues we will continue daily heparin level.  Daily INR and CBC    RuthNicole Cellah Clinical Pharmacist Pager: [phone]/2016 10:47 AM

## 2014-10-18 NOTE — Patient Outreach (Signed)
[place] [facility]) Care Management  10/18/2014  [name] 1946-03-10 [id number]   Referral from Natividad Brood, RN for [employer], Pharmacy and Social Work, assigned [name], RN; Deanne Coffer, PharmD; [name], LCSW.  [name]. [place], [place] Management [place] Assistant Phone: [phone] Fax: [phone]

## 2014-10-19 LAB — PROTIME-INR
INR: 1.91 — AB (ref 0.00–1.49)
Prothrombin Time: 21.8 seconds — ABNORMAL HIGH (ref 11.6–15.2)

## 2014-10-19 LAB — CBC
HCT: 38.4 % — ABNORMAL LOW (ref 39.0–52.0)
HEMOGLOBIN: 12.7 g/dL — AB (ref 13.0–17.0)
MCH: 30 pg (ref 26.0–34.0)
MCHC: 33.1 g/dL (ref 30.0–36.0)
MCV: 90.6 fL (ref 78.0–100.0)
PLATELETS: 205 10*3/uL (ref 150–400)
RBC: 4.24 MIL/uL (ref 4.22–5.81)
RDW: 14.2 % (ref 11.5–15.5)
WBC: 8.8 10*3/uL (ref 4.0–10.5)

## 2014-10-19 LAB — DIGOXIN LEVEL: DIGOXIN LVL: 0.6 ng/mL — AB (ref 0.8–2.0)

## 2014-10-19 LAB — HIV ANTIBODY (ROUTINE TESTING W REFLEX): HIV Screen 4th Generation wRfx: NONREACTIVE

## 2014-10-19 NOTE — Progress Notes (Signed)
  [facility] Progress Note  10/19/2014 8:28 AM 7 Days Post-Op  Subjective:  Still with some numbness in left foot.    Filed Vitals:   10/19/14 0712  BP: 116/91  Pulse: 115  Temp: 98.3 F (36.8 C)  Resp: 19    Physical Exam: Incisions:  Left groin incision with steri-strips intact.  Extremities:  Palpable left DP pulse.   CBC    Component Value Date/Time   WBC 8.8 10/19/2014 0610   RBC 4.24 10/19/2014 0610   RBC 4.61 12/22/2008 0515   HGB 12.7* 10/19/2014 0610   HCT 38.4* 10/19/2014 0610   PLT 205 10/19/2014 0610   MCV 90.6 10/19/2014 0610   MCH 30.0 10/19/2014 0610   MCHC 33.1 10/19/2014 0610   RDW 14.2 10/19/2014 0610   LYMPHSABS 1.3 10/11/2014 1205   MONOABS 1.0 10/11/2014 1205   EOSABS 0.1 10/11/2014 1205   BASOSABS 0.0 10/11/2014 1205    BMET    Component Value Date/Time   NA 140 10/16/2014 0350   K 4.1 10/16/2014 0350   CL 108 10/16/2014 0350   CO2 26 10/16/2014 0350   GLUCOSE 93 10/16/2014 0350   BUN 14 10/16/2014 0350   CREATININE 1.31* 10/16/2014 0350   CREATININE 1.33 08/10/2014 1221   CALCIUM 9.3 10/16/2014 0350   GFRNONAA 54* 10/16/2014 0350   GFRNONAA 54* 08/10/2014 1221   GFRAA >60 10/16/2014 0350   GFRAA 62 08/10/2014 1221    INR    Component Value Date/Time   INR 1.91* 10/19/2014 0610   INR 1.2 07/28/2014 1344     Intake/Output Summary (Last 24 hours) at 10/19/14 0828 Last data filed at 10/19/14 0500  Gross per 24 hour  Intake      0 ml  Output    750 ml  Net   -750 ml     Assessment:  69 y.o. [name] is s/p: Left femoral embolectomy and intraoperative arteriogram  7 Days Post-Op  Plan: -Palpable left DP pulse.  -Left groin incision intact. -To be discharged home today. -F/u with Dr. Donnetta Hutching on 10/25/14    Virgina Jock, PA-C [facility] Office: [phone] Pager: [phone] 10/19/2014 8:28 AM

## 2014-10-19 NOTE — Discharge Instructions (Signed)
-  Take coumadin 7.5 mg tonight at 6:00 PM bc your INR is a little low at 1.9 then take 5 mg tomm night and continue this regimen ---please attend your appt with Dr. Elie Confer for INR check on Monday  -Start taking ramipril 2.5 mg twice a day, lopressor 75 mg three times daily, digoxin 0.0625 mg daily, aspirin 81 mg daily, and crestor 20 mg daily -Stop taking naproxen, bicarbonate, and cardizem -Please attend your follow-up appt with our clinic, Dr. Rayann Heman, and Dr. Donnetta Hutching -Nice seeing you again!

## 2014-10-19 NOTE — Progress Notes (Signed)
Subjective:  Pt seen and examined in AM. No acute events overnight. Yesterday he did not have assistance at home and was not able to  leave. His tachycardia has improved. He denies chest pain, dyspnea, palpitations, or lightheadedness from baseline.  No bleeding from his left LE embolectomy site.    Objective: Vital signs in last 24 hours: Filed Vitals:   10/18/14 2045 10/19/14 0015 10/19/14 0500 10/19/14 0712  BP: 132/61 127/82 119/89 116/91  Pulse: 70 81 93 115  Temp: 98.8 F (37.1 C) 98.1 F (36.7 C) 98 F (36.7 C) 98.3 F (36.8 C)  TempSrc: Oral Oral Oral Oral  Resp: 18 16 16 19   Height:      Weight:   150 lb 14.4 oz (68.448 kg)   SpO2: 100% 97% 97% 97%   Weight change: 4 lb 15.3 oz (2.248 kg)  Intake/Output Summary (Last 24 hours) at 10/19/14 0716 Last data filed at 10/19/14 0500  Gross per 24 hour  Intake      0 ml  Output    750 ml  Net   -750 ml   PHYSICAL EXAMINATION:  General: Thin appearing, NAD Heart: Tachycardic, irregularly irregular rhythm  Lungs: Increased BS, no ronchi, wheezing, or rales Abdomen: Soft, non-tender, non-distended, normal BS Extremities: No edema, left groin with CDI dressing/bandage     Lab Results: Basic Metabolic Panel:  Recent Labs Lab 10/13/14 0341  10/15/14 0355 10/16/14 0350  NA  --   < > 139 140  K  --   < > 4.2 4.1  CL  --   < > 109 108  CO2  --   < > 21* 26  GLUCOSE  --   < > 88 93  BUN  --   < > 14 14  CREATININE  --   < > 1.25* 1.31*  CALCIUM  --   < > 8.7* 9.3  MG 1.9  --   --   --   < > = values in this interval not displayed.  CBC:  Recent Labs Lab 10/18/14 0309 10/19/14 0610  WBC 8.7 8.8  HGB 12.7* 12.7*  HCT 38.8* 38.4*  MCV 90.7 90.6  PLT 181 205   CBG:  Recent Labs Lab 10/12/14 1314  GLUCAP 138*  Coagulation:  Recent Labs Lab 10/15/14 0355 10/16/14 0350 10/17/14 0910 10/18/14 0309  LABPROT 17.3* 19.3* 23.9* 23.4*  INR 1.40 1.62* 2.16* 2.10*   Urine Drug Screen: Drugs of  Abuse     Component Value Date/Time   LABOPIA NONE DETECTED 10/11/2014 1724   LABOPIA NEGATIVE 12/27/2008 1943   COCAINSCRNUR NONE DETECTED 10/11/2014 1724   COCAINSCRNUR NEG 07/06/2009 2225   LABBENZ NONE DETECTED 10/11/2014 1724   LABBENZ NEG 07/06/2009 2225   AMPHETMU NONE DETECTED 10/11/2014 1724   AMPHETMU NEG 07/06/2009 2225   THCU POSITIVE* 10/11/2014 1724   LABBARB NONE DETECTED 10/11/2014 1724    Alcohol Level: No results for input(s): ETH in the last 168 hours. Urinalysis: No results for input(s): COLORURINE, LABSPEC, PHURINE, GLUCOSEU, HGBUR, BILIRUBINUR, KETONESUR, PROTEINUR, UROBILINOGEN, NITRITE, LEUKOCYTESUR in the last 168 hours.  Invalid input(s): APPERANCEUR  Micro Results: Recent Results (from the past 240 hour(s))  MRSA PCR Screening     Status: None   Collection Time: 10/11/14  5:29 PM  Result Value Ref Range Status   MRSA by PCR NEGATIVE NEGATIVE Final    Comment:        The GeneXpert MRSA Assay (FDA approved for NASAL  specimens only), is one component of a comprehensive MRSA colonization surveillance program. It is not intended to diagnose MRSA infection nor to guide or monitor treatment for MRSA infections.   Surgical pcr screen     Status: Abnormal   Collection Time: 10/12/14  8:51 AM  Result Value Ref Range Status   MRSA, PCR NEGATIVE NEGATIVE Final   Staphylococcus aureus POSITIVE (A) NEGATIVE Final    Comment:        The Xpert SA Assay (FDA approved for NASAL specimens in patients over 59 years of age), is one component of a comprehensive surveillance program.  Test performance has been validated by [facility] for patients greater than or equal to 30 year old. It is not intended to diagnose infection nor to guide or monitor treatment.    Studies/Results: No results found. Medications: I have reviewed the patient's current medications. Scheduled Meds: . aspirin EC  81 mg Oral Daily  . buPROPion  75 mg Oral BID  . digoxin   0.0625 mg Oral Daily  . feeding supplement (ENSURE ENLIVE)  237 mL Oral TID BM  . metoprolol  75 mg Oral Q8H  . pantoprazole  40 mg Oral Daily  . ramipril  2.5 mg Oral BID  . rosuvastatin  20 mg Oral q1800  . tamsulosin  0.4 mg Oral Daily  . tiotropium  18 mcg Inhalation Daily  . Warfarin - Pharmacist Dosing Inpatient   Does not apply q1800   Continuous Infusions:  PRN Meds:.diphenhydrAMINE-zinc acetate, ipratropium-albuterol, metoprolol, oxyCODONE-acetaminophen, senna-docusate Assessment/Plan:  Persistent Afib with RVR complicated by thromboembolism (STEMI, TIA, and left LE embolus s/p embolectomy) in setting of  non-adherence to [facility] therapy - Current HR 115 with irregularly irregular rhythm. INR slightly sub-therapeutic at 1.91. Pt with CHADS2 score of 4 requiring life-long AC therapy.   -Appreciate cardiology recommendations  -Continue coumadin 5 mg /7.5 mg daily (again today) with INR check on Monday with  Dr. Maudie Mercury -Continue digoxin 0.0625 mg daily for rate control, dig level low at 0.6, however has had toxicity in the past, will not increase dosage -Continue metoprolol 25 mg TID for rate control -Avoid CCB in setting of reduced EF  -Per Dr. 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 Donnetta Hutching next week and cardiology follow-up  Hypertension - Currently normotensive  -Continue metoprolol 25 mg and ramipril 2.5 mg BID  Chronic Systolic CHF - Currently euvolemic.  -Continue metoprolol 25 mg and ramipril 2.5 mg BID -Will need repeat 2D-echo in 3 months  Severe Malnutrition - Wt 150 lb with albumin of 3.  -Encourage dietary intake  -Continue ensure 237 mL TID between meals   Hyperlipidemia - Last lipid panel on 10/12/14 was normal.  -Continue rosuvastatin 20 mg daily   BPH - Currently  stable. -Continue flomax 0.4 mg daily  -Monitor for orthostatic hypotension    Depression - Currently with stable mood -Continue wellbutrin 75 mg BID   GERD - Currently without acid reflux symptoms.  -Continue protonix 40 mg daily   HIV Screening - Last HIV Ab was negative on 05/29/13 -F/u HIV Ab  Diet: Regular DVT Ppx: Coumadin  Code: Full    Dispo: Disposition is deferred at this time, awaiting improvement of current medical problems.  Anticipated discharge is today  The patient does have a current  PCP Bertha Stakes, MD) and does need an [facility] follow-up appointment after discharge.  The patient does not have transportation limitations that hinder transportation to clinic appointments.  .Services Needed at time of discharge: Y = Yes, Blank = No PT:  No  OT:  No  RN:  Yes  Equipment:  None  Other:  None     LOS: 8 days   Juluis Mire, MD 10/19/2014, 7:16 AM

## 2014-10-19 NOTE — Discharge Summary (Signed)
Name: [name] MRN: [id number] DOB: 10/08/1945 69 y.o. PCP: Bertha Stakes, MD  Date of Admission: 10/11/2014 12:54 PM Date of Discharge: 10/19/2014 Attending Physician: Dr. Raquel Sarna B. Mullen  Discharge Diagnosis:  Persistent Afib with RVR complicated by multiple emboli with resulting STEMI, TIA, and acute left LE  ischemia s/p embolectomy in setting of non-adherence to [facility] therapy  Hypertension  Chronic Systolic CHF with ICD  CKD Stage 3  Trichomonas Infection  COPD  Marijuana use Severe Malnutrition  Hyperlipidemia  BPH  Depression  GERD  HIV Screening  DVT Prophylaxis      Discharge Medications:   Medication List    STOP taking these medications        diltiazem 240 MG 24 hr capsule  Commonly known as:  CARDIZEM CD     naproxen sodium 220 MG tablet  Commonly known as:  ANAPROX     sodium bicarbonate 650 MG tablet      TAKE these medications        albuterol 108 (90 BASE) MCG/ACT inhaler  Commonly known as:  VENTOLIN HFA  Inhale 2 puffs into the lungs every 6 (six) hours as needed for wheezing or shortness of breath.     aspirin 81 MG EC tablet  Take 1 tablet (81 mg total) by mouth daily.     betamethasone dipropionate 0.05 % cream  Commonly known as:  DIPROLENE  Apply to affected areas on back and arm once daily for 2 weeks.  Do not use on face or in skin folds.     buPROPion 75 MG tablet  Commonly known as:  WELLBUTRIN  Take 1 tablet (75 mg total) by mouth 2 (two) times daily.     Digoxin 62.5 MCG Tabs  Take 0.0625 mg by mouth daily.     lactose free nutrition Liqd  Take 237 mLs by mouth 3 (three) times daily with meals.     Metoprolol Tartrate 75 MG Tabs  Take 75 mg by mouth 3 (three) times daily.     omeprazole 20 MG capsule  Commonly known as:  PRILOSEC  Take 2 capsules (40 mg total) by mouth daily.     oxyCODONE-acetaminophen 10-325 MG per tablet  Commonly known as:  PERCOCET  Take 1 tablet by mouth every 4 (four) hours as needed for  pain. Do not take more than 5 doses in a 24 hour period.     ramipril 2.5 MG capsule  Commonly known as:  ALTACE  Take 1 capsule (2.5 mg total) by mouth 2 (two) times daily.     rosuvastatin 20 MG tablet  Commonly known as:  CRESTOR  Take 1 tablet (20 mg total) by mouth daily at 6 PM.     senna-docusate 8.6-50 MG per tablet  Commonly known as:  [employer] SENNA PLUS  Take 1 tablet by mouth daily as needed (for constipation).     tamsulosin 0.4 MG Caps capsule  Commonly known as:  FLOMAX  Take 1 capsule (0.4 mg total) by mouth daily.     tiotropium 18 MCG inhalation capsule  Commonly known as:  SPIRIVA  Place 1 capsule (18 mcg total) into inhaler and inhale daily.     triamcinolone cream 0.1 %  Commonly known as:  KENALOG  Apply 1 application topically 2 (two) times daily. Use on affected areas on chest for 2 weeks.     warfarin 2.5 MG tablet  Commonly known as:  COUMADIN  Take 2-3 tablets (5-7.5 mg total)  by mouth See admin instructions. Pt takes 5mg  every other day - alternates w/ 7mg  every other day        Disposition and follow-up:   Mr.Malyk F Kohrs was discharged from [facility] in Good condition.  At the hospital follow up visit please address:  1.  If compliant with coumadin - needs INR check, last INR 1.91 on 6/1, on alternating 5-7.5 schedule       Make sure compliant with digoxin 0.0625 mg daily, lopressor 75 mg TID, crestor 20 mg daily, and ramipril 2.5 mg BID       Make sure NOT taking diltiazem, naproxen and bicarbonate        Per cardiology to change metoprolol tartrate to toprol-xl once on stable dose       Needs digoxin level checked due to history of toxicity in the past, they did not want to increase dose from current 0.0625 mg daily     Started on aspirin 81 mg daily plus already on coumadin in setting of STEMI and TIA - discontinue aspirin when appropriate        He needs outpatient cardiac stress testing and repeat 2D-echo in 3 months              Make sure he attends scheduled appts with Dr. Donnetta Hutching and Dr. Rayann Heman     2.  Labs / imaging needed at time of follow-up: INR, CBC (Hg), CMP (AST), digoxin level, repeat 2D-echo end of August, cardiac stress testing  3.  Pending labs/ test needing follow-up: None  Follow-up Appointments:     Follow-up Information    Follow up with Dr. Elie Confer Pharmacist   On 10/24/2014.   Why:  3:00 PM       Follow up with Jessee Avers, MD On 10/24/2014.   Specialty:  Internal Medicine   Why:  3:45 PM   Contact information:   [place] [address] [phone]       Follow up with [place].   Why:  Registered Nurse and Aide.    Contact information:   [address] [place] [address] [phone]       Follow up with Thompson Grayer, MD On 11/03/2014.   Specialty:  Cardiology   Why:  2:15 PM    Contact information:   [place] [address] [phone]       Follow up with Curt Jews, MD On 10/25/2014.   Specialties:  Vascular Surgery, Cardiology   Why:  12:00 PM    Contact information:   [address] [place] [address] [phone]       Discharge Instructions: -Take coumadin 7.5 mg tonight at 6:00 PM bc your INR is a little low at 1.9 then take 5 mg tomm night and continue this regimen ---please attend your appt with Dr. Elie Confer for INR check on Monday  -Start taking ramipril 2.5 mg twice a day, lopressor 75 mg three times daily, digoxin 0.0625 mg daily, aspirin 81 mg daily, and crestor 20 mg daily -Stop taking naproxen, bicarbonate, and cardizem -Please attend your follow-up appt with our clinic, Dr. Rayann Heman, and Dr. Donnetta Hutching -Nice seeing you again!    Discharge Instructions    AMB Referral to [place] Management    Complete by:  As directed   Reason for consult:  Restart of services - community support, medication assessmemt for adherence  Diagnoses of:  Stroke: Ischemic/TIA  Expected  date of contact:   1-3 days (reserved for hospital discharges)  Please assign this patient to [facility], social worker for meal assistance possible care assistance with his [employer] - , pharmacy for possible mail deliver medications and a medication management assessment [patient admits to forgetting medications].  Patient lives with his son who is a Administrator and is gone a lot.  His daughter home schools but assist with his bills.  Patient trying to manage his own medications and forgetting to pick up medications at times.  Call for questions:   Natividad Brood, RN BSN [facility] Liaison  [phone] business mobile phone           Consultations: Treatment Team:  Darlin Coco, MD Rosetta Posner, MD  Procedures Performed:  Ct Head Wo Contrast  10/11/2014   CLINICAL DATA:  Code stroke. RIGHT-sided facial droop with slurred speech.  EXAM: CT HEAD WITHOUT CONTRAST  TECHNIQUE: Contiguous axial images were obtained from the base of the skull through the vertex without intravenous contrast.  COMPARISON:  CT head 10/12/2012.  FINDINGS: No evidence for acute infarction, hemorrhage, mass lesion, hydrocephalus, or extra-axial fluid. Normal for age cerebral volume. No white matter disease. Calvarium intact. No sinus or mastoid fluid.  IMPRESSION: Negative exam.  No change from priors.  Critical Value/emergent results were called by telephone at the time of interpretation on 10/11/2014 at 1:12 pm to Dr. Janann Colonel , who verbally acknowledged these results.   Electronically Signed   By: Rolla Flatten M.D.   On: 10/11/2014 13:14   Ct Chest W Contrast  10/13/2014   CLINICAL DATA:  69 year old male with unexplained weight loss. Patient with myocardial infarction, CVA, multiple arch O emboli. Acute on chronic kidney disease.  EXAM: CT CHEST, ABDOMEN, AND PELVIS WITH CONTRAST  TECHNIQUE: Multidetector CT imaging of the chest, abdomen and pelvis was performed following the standard  protocol during bolus administration of intravenous contrast.  CONTRAST:  6mL OMNIPAQUE IOHEXOL 300 MG/ML  SOLN  COMPARISON:  06/02/2013 and prior chest CTs  FINDINGS: CT CHEST FINDINGS  Mediastinum/Nodes: Cardiomegaly again noted. Moderate coronary artery calcifications are identified. The ascending aorta measures 3.7 cm and greatest diameter. No enlarged lymph nodes are identified. There is no evidence of pericardial effusion.  Lungs/Pleura: Dependent and bibasilar atelectasis/ scarring again noted. Scarring within the mid and lower left lung again identified. Patchy ground-glass opacities within the left upper lobe may represent aspiration or for pneumonia. There is no evidence of suspicious mass or nodule. COPD/emphysema again identified. Left posterior pleural calcifications are again identified.  Musculoskeletal: No acute or suspicious abnormalities.  CT ABDOMEN AND PELVIS FINDINGS  Hepatobiliary: The liver and gallbladder are unremarkable. There is no evidence of biliary dilatation.  Pancreas: Unremarkable  Spleen: Unremarkable  Adrenals/Urinary Tract: Moderate bilateral renal atrophy and cortical thinning noted, right greater than left. There is no evidence of hydronephrosis, renal mass or definite renal calculi.  Stomach/Bowel: There is no evidence of bowel obstruction. No definite bowel wall thickening noted. No dilated bowel loops are present. Hip replacement artifact obscures detail within the pelvis.  Vascular/Lymphatic: No enlarged lymph nodes or abdominal aortic aneurysm identified. Calcifications within the IVC, iliac and femoral veins noted.  Reproductive: Prostate not well visualized.  Other: No free fluid, abscess or pneumoperitoneum.  Musculoskeletal: Mild stranding in the left inguinal region is likely postoperative. Bilateral hip replacements identified. No acute bony abnormalities are identified. Degenerative changes in the lumbar spine and bilateral L5 pars  defects again noted.  IMPRESSION:  No evidence of primary malignancy.  Patchy left upper lobe ground-glass opacities - possible aspiration versus pneumonia.  Cardiomegaly, coronary artery disease, ectatic ascending thoracic aorta, COPD/emphysema and moderate bilateral renal atrophy.   Electronically Signed   By: Margarette Canada M.D.   On: 10/13/2014 14:10   Ct Abdomen Pelvis W Contrast  10/13/2014   CLINICAL DATA:  69 year old male with unexplained weight loss. Patient with myocardial infarction, CVA, multiple arch O emboli. Acute on chronic kidney disease.  EXAM: CT CHEST, ABDOMEN, AND PELVIS WITH CONTRAST  TECHNIQUE: Multidetector CT imaging of the chest, abdomen and pelvis was performed following the standard protocol during bolus administration of intravenous contrast.  CONTRAST:  25mL OMNIPAQUE IOHEXOL 300 MG/ML  SOLN  COMPARISON:  06/02/2013 and prior chest CTs  FINDINGS: CT CHEST FINDINGS  Mediastinum/Nodes: Cardiomegaly again noted. Moderate coronary artery calcifications are identified. The ascending aorta measures 3.7 cm and greatest diameter. No enlarged lymph nodes are identified. There is no evidence of pericardial effusion.  Lungs/Pleura: Dependent and bibasilar atelectasis/ scarring again noted. Scarring within the mid and lower left lung again identified. Patchy ground-glass opacities within the left upper lobe may represent aspiration or for pneumonia. There is no evidence of suspicious mass or nodule. COPD/emphysema again identified. Left posterior pleural calcifications are again identified.  Musculoskeletal: No acute or suspicious abnormalities.  CT ABDOMEN AND PELVIS FINDINGS  Hepatobiliary: The liver and gallbladder are unremarkable. There is no evidence of biliary dilatation.  Pancreas: Unremarkable  Spleen: Unremarkable  Adrenals/Urinary Tract: Moderate bilateral renal atrophy and cortical thinning noted, right greater than left. There is no evidence of hydronephrosis, renal mass or definite renal calculi.  Stomach/Bowel: There  is no evidence of bowel obstruction. No definite bowel wall thickening noted. No dilated bowel loops are present. Hip replacement artifact obscures detail within the pelvis.  Vascular/Lymphatic: No enlarged lymph nodes or abdominal aortic aneurysm identified. Calcifications within the IVC, iliac and femoral veins noted.  Reproductive: Prostate not well visualized.  Other: No free fluid, abscess or pneumoperitoneum.  Musculoskeletal: Mild stranding in the left inguinal region is likely postoperative. Bilateral hip replacements identified. No acute bony abnormalities are identified. Degenerative changes in the lumbar spine and bilateral L5 pars defects again noted.  IMPRESSION: No evidence of primary malignancy.  Patchy left upper lobe ground-glass opacities - possible aspiration versus pneumonia.  Cardiomegaly, coronary artery disease, ectatic ascending thoracic aorta, COPD/emphysema and moderate bilateral renal atrophy.   Electronically Signed   By: Margarette Canada M.D.   On: 10/13/2014 14:10   Dg Chest Port 1 View  10/11/2014   CLINICAL DATA:  Shortness of breath.  EXAM: PORTABLE CHEST - 1 VIEW  COMPARISON:  10/11/2014 and 01/17/2014  FINDINGS: Right-sided pacemaker unchanged. Lungs are adequately inflated with stable chronic elevation of the left hemidiaphragm and stable chronic changes over the left lung. Known calcified pleural plaques. No focal lobar consolidation or effusion cardiomediastinal silhouette and remainder of the exam is unchanged.  IMPRESSION: No active disease.  Chronic stable changes of the left lung. Evidence of asbestos related pleural disease.   Electronically Signed   By: Marin Olp M.D.   On: 10/11/2014 21:56   Dg Chest Portable 1 View  10/11/2014   CLINICAL DATA:  Right facial droop  EXAM: PORTABLE CHEST - 1 VIEW  COMPARISON:  01/17/2014  FINDINGS: Cardiac shadow is again mildly enlarged. A pacing device is again seen. Chronic left pleural effusion and left basilar changes are noted  although they have increased in the interval from the prior exam. The right lung remains clear. The visualized upper abdomen is unremarkable.  IMPRESSION: Increasing chronic changes in the left lung base when compare with the prior exam. Calcified pleural plaques are again noted.   Electronically Signed   By: Inez Catalina M.D.   On: 10/11/2014 14:06   Dg Ang/ext/uni/or Left  10/12/2014   CLINICAL DATA:  Femoral thrombectomy for embolic occlusion in the left lower extremity.  EXAM: LEFT ANG/EXT/UNI/ OR  COMPARISON:  None.  FINDINGS: Intraoperative image shows a normally patent popliteal artery and normally patent visualized anterior tibial, peroneal and posterior tibial arteries to nearly the level of the ankle. No filling defects are identified.  IMPRESSION: No distal occlusions or filling defects identified at the level of the popliteal and tibial arteries.   Electronically Signed   By: Aletta Edouard M.D.   On: 10/12/2014 14:02    2D Echo: 10/11/14  Study Conclusions  - Left ventricle: The cavity size was normal. Systolic function was moderately to severely reduced. The estimated ejection fraction was in the range of 30% to 35%. Diffuse hypokinesis. - Aortic valve: There was mild regurgitation. - Right ventricle: The cavity size was mildly dilated. Wall thickness was normal. Systolic function was moderately reduced. - Right atrium: The atrium was mildly dilated.  Impressions:  - No cardiac source of emboli was indentified.  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   When  asked initially whey he was here he stated i dont know. Friends are at the bedside and said they last saw him normal around 12 pm. The patient states he doesn't remember what happened but he was driving and noticed he had trouble getting his words out, his friend noticed he was drooling, he c/o generalized weakness and trouble moving and feeling very confused. ED also noted slurred speech, right facial droop, numbness on the right side of his body which is resolved and decreased sensation on the right side of his body. CT head negative pt unable to get MRI due to pacemaker.   Hospital Course by problem list:   Persistent Afib with RVR complicated by multiple emboli with resulting STEMI, TIA, and acute left LE  ischemia s/p embolectomy in setting of non-adherence to [facility] therapy - Pt reported noncompliance with coumadin and had sub-therapeutic INR of 1.18 on admission. Pt presented with dysarthria, facial droop, right sided paraesthesias,and confusion with resolution of symptoms consistent with TIA. CT head w/o contrast was negative. MRI could not be performed due to pacemaker. Pt was also found to have STEMI with peak troponin peak of 25.83 with no chest pain. He did not receive left heart catherization due to no chest pain and down-trending troponins. CT chest revealed moderate coronary artery calcifications. 2D-echo revealed EF 30-35% with diffuse hypokinesis. He is to have outpatient nuclear medicine cardiac stress testing and repeat 2D-echo in 3 months per cardiology. His lopressor was increased from 25 mg BID to 75 mg TID (to be eventually changed to toprol-xl per cardiology) and started on ramipril 2.5 mg BID, crestor 20 mg daily, and aspirin 81 mg daily. He was also found  to have acute left lower extremity ischemia due to embolus requiring left femoral embolectomy on 10/12/14. He is to follow-up with Dr. Donnetta Hutching next week. His INR on day of discharge was slightly sub-therapeutic at 1.91. Pt with CHADS2  score of 4 requiring life-long AC therapy.His coumadin was bridged with IV heparin and instructed to continue coumadin 5 mg alternating with 7.5 mg daily with INR check follow-up on 10/24/14. The importance of being compliant with coumadin was emphasized as this was thought to be the reason for his multiple emboli resulting in STEMI, TIA, and acute left LE ishemia. CT chest/abdomen/pelvis did not reveal evidence of malignancy. He was also started on digoxin 0.0625 mg daily for rate control for persistent atrial fibrillation which he was previously on and had toxicity to. Digoxin level was 0.6 on day of discharge. His metoprolol was increased from 25 mg BID to 75 mg TID for rate control. His home diltiazem was discontinued in setting of reduced EF. TSH was normal. Due to inadequate rate control and maximum medical therapy, pt will likely need AV node ablation. He was scheduled for close cardiology follow-up with Dr. Rayann Heman.   Hypertension - Pt with blood pressure range of 98/73-158/92 during hospitalization. Pt received metoprolol and ramipril during hospitalization and instructed to continue on disharge. His home diltiazem was discontinued due to reduced EF.    Chronic Systolic CHF with ICD - Pt with no exacerbation during hospitalization. 2D-echo revealed EF 30-35% with diffuse hypokinesis in setting of STEMI. Prior echo on 12/16/13 revealed EF 45-50%. Pt received metoprolol and ramipril during hospitalization. Pt needs repeat 2D-echo in 3 months.   CKD Stage 3 - Pt with stable renal function during hospitalization with creatine below baseline Cr of 1.6 and normal urine output. Nephrotoxins were avoided during hospitalization and was instructed to discontinue home naproxen use.  Trichomonas Infection - Pt with UA on admission with trichomonas. Pt was treated with 2 g of PO flagyl and counseled on having sexual partner(s) treated.   COPD - Pt with no exacerbation during hospitalization. He was continued on  home spiriva and albuterol as needed.   Marijuana use - Pt tested positive for THC on UDS on admission. Pt was counseled on cessation.  Severe Malnutrition - Pt with BMI of 19.15 with albumin of 3. Pt received nutritional supplementation during hospitalization and encouraged to increase   Hyperlipidemia - Last lipid panel on 10/12/14 was normal with LDL at goal <70. Pt was started on rosuvastatin 20 mg daily in setting of STEMI.   BPH - Pt with stable symptoms and was continued on home flomax 0.4 mg daily.    Depression - Pt with stable mood during hospitalization and received wellbutrin 75 mg BID   GERD - Pt received protonix during hospitalization without acid reflux symptoms during hospitalization .   HIV Screening - Pt tested negative for HIV during hospitalization.    DVT Prophylaxis - Pt received IV heparin and bridged with coumadin during hospitalization with no evidence of DVT.      Discharge Vitals:   BP 116/91 mmHg  Pulse 115  Temp(Src) 98.3 F (36.8 C) (Oral)  Resp 19  Ht 6\' 3"  (1.905 m)  Wt 150 lb 14.4 oz (68.448 kg)  BMI 18.86 kg/m2  SpO2 97%  Discharge Labs:  Results for orders placed or performed during the hospital encounter of 10/11/14 (from the past 24 hour(s))  CBC     Status: Abnormal   Collection Time: 10/19/14  6:10 AM  Result Value Ref Range   WBC 8.8 4.0 - 10.5 K/uL   RBC 4.24 4.22 - 5.81 MIL/uL   Hemoglobin 12.7 (L) 13.0 - 17.0 g/dL   HCT 38.4 (L) 39.0 - 52.0 %   MCV 90.6 78.0 - 100.0 fL   MCH 30.0 26.0 - 34.0 pg   MCHC 33.1 30.0 - 36.0 g/dL   RDW 14.2 11.5 - 15.5 %   Platelets 205 150 - 400 K/uL  Protime-INR     Status: Abnormal   Collection Time: 10/19/14  6:10 AM  Result Value Ref Range   Prothrombin Time 21.8 (H) 11.6 - 15.2 seconds   INR 1.91 (H) 0.00 - 1.49  Digoxin level     Status: Abnormal   Collection Time: 10/19/14  6:10 AM  Result Value Ref Range   Digoxin Level 0.6 (L) 0.8 - 2.0 ng/mL   *Note: Due to a large number of  results and/or encounters for the requested time period, some results have not been displayed. A complete set of results can be found in Results Review.    Signed: Juluis Mire, MD 10/19/2014, 8:04 AM    Services Ordered on Discharge: RN, Nurse's Aide  Equipment Ordered on Discharge: None

## 2014-10-20 ENCOUNTER — Other Ambulatory Visit: Payer: Self-pay | Admitting: *Deleted

## 2014-10-20 DIAGNOSIS — I480 Paroxysmal atrial fibrillation: Secondary | ICD-10-CM | POA: Diagnosis not present

## 2014-10-20 DIAGNOSIS — I129 Hypertensive chronic kidney disease with stage 1 through stage 4 chronic kidney disease, or unspecified chronic kidney disease: Secondary | ICD-10-CM | POA: Diagnosis not present

## 2014-10-20 DIAGNOSIS — Z8673 Personal history of transient ischemic attack (TIA), and cerebral infarction without residual deficits: Secondary | ICD-10-CM | POA: Diagnosis not present

## 2014-10-20 DIAGNOSIS — Z48812 Encounter for surgical aftercare following surgery on the circulatory system: Secondary | ICD-10-CM | POA: Diagnosis not present

## 2014-10-20 DIAGNOSIS — I213 ST elevation (STEMI) myocardial infarction of unspecified site: Secondary | ICD-10-CM | POA: Diagnosis not present

## 2014-10-20 DIAGNOSIS — I5022 Chronic systolic (congestive) heart failure: Secondary | ICD-10-CM | POA: Diagnosis not present

## 2014-10-20 NOTE — Patient Outreach (Signed)
[facility]) Care Management  10/20/2014  [name] 1945-10-15 [id number]   RN spoke with pt today who indicated [facility] is scheduled for a visit today at 2pm. States his medical issues upon admission were the following: At fib, CHF, Kidney disease, COPD, HTN, GERD and severe nutritional issues. Pt states he is having issues with understanding all his medication and has not taken any today awaiting the [place].Verified he received the COPD and HF packet upon his recent discharge.  RN clarified he was taking all her inhalers and nebulizer treatments prior to his recent admission and there has been no changes with those medications. RN strongly encouraged pt to take her nebulizer and inhalers along with usage of his home O2 concentrator (PRN) to prevent exacerbation episodes (verbalized an understanding). RN encouraged pt not to wait on the [facility] nurse to arrive prior to taking his daily medication based upon his discharge sheet instructions if he has an understanding. Also inquired on HF monitoring and inquired if pt is weight daily (denied). Explained the purpose of HF and COPD and the importance of daily monitoring to prevent possible symptoms that can result in readmission to the hospital. Again discussed the HF and COPD zones and verified pt remains in the GREEN zone based upon the discussion today. RN offered [facility] services for community home visits however hesitate. Explained telephonic case management and home visits with direct care in assisting pt with managing his care better. Offered to follow up next week with an ongoing transition of care call pending possible arrangements for scheduled the initial home visit (pt agreed). Will inquire further on services provided by outreach [facility] nurse and discussed a plan of care along with goals concerning his ongoing care. Pt receptive to what has been discussed and appreciative for today's call and information provided.  [name], RN Care Management Coordinator [facility] Main Office [phone]

## 2014-10-21 ENCOUNTER — Telehealth: Payer: Self-pay | Admitting: *Deleted

## 2014-10-21 ENCOUNTER — Ambulatory Visit: Payer: Commercial Managed Care - HMO | Admitting: Pharmacist

## 2014-10-21 DIAGNOSIS — Z48812 Encounter for surgical aftercare following surgery on the circulatory system: Secondary | ICD-10-CM | POA: Diagnosis not present

## 2014-10-21 DIAGNOSIS — Z8673 Personal history of transient ischemic attack (TIA), and cerebral infarction without residual deficits: Secondary | ICD-10-CM | POA: Diagnosis not present

## 2014-10-21 DIAGNOSIS — I213 ST elevation (STEMI) myocardial infarction of unspecified site: Secondary | ICD-10-CM | POA: Diagnosis not present

## 2014-10-21 DIAGNOSIS — I480 Paroxysmal atrial fibrillation: Secondary | ICD-10-CM | POA: Diagnosis not present

## 2014-10-21 DIAGNOSIS — I129 Hypertensive chronic kidney disease with stage 1 through stage 4 chronic kidney disease, or unspecified chronic kidney disease: Secondary | ICD-10-CM | POA: Diagnosis not present

## 2014-10-21 DIAGNOSIS — I5022 Chronic systolic (congestive) heart failure: Secondary | ICD-10-CM | POA: Diagnosis not present

## 2014-10-21 NOTE — Telephone Encounter (Signed)
Wilma with [facility] called about pt. - problems with standing and SOB. Only had a cheese and bologna sandwich today. Has no friends and family. Son is on the road - truck driver. [facility] decided to send a nurse to home to asscess pt. Will send to ER if needed. Has no transportation. Has appt in clinic 10/24/14 with Dr [name] Rieger. Hilda Blades Devany Aja RN 10/21/14 3:15PM

## 2014-10-24 ENCOUNTER — Encounter (HOSPITAL_COMMUNITY): Payer: Self-pay | Admitting: General Practice

## 2014-10-24 ENCOUNTER — Telehealth: Payer: Self-pay | Admitting: *Deleted

## 2014-10-24 ENCOUNTER — Other Ambulatory Visit: Payer: Self-pay | Admitting: *Deleted

## 2014-10-24 ENCOUNTER — Ambulatory Visit: Payer: Self-pay | Admitting: *Deleted

## 2014-10-24 ENCOUNTER — Ambulatory Visit (INDEPENDENT_AMBULATORY_CARE_PROVIDER_SITE_OTHER): Payer: Commercial Managed Care - HMO | Admitting: Pharmacist

## 2014-10-24 ENCOUNTER — Encounter: Payer: Self-pay | Admitting: Internal Medicine

## 2014-10-24 ENCOUNTER — Observation Stay (HOSPITAL_COMMUNITY)
Admission: AD | Admit: 2014-10-24 | Discharge: 2014-10-27 | Disposition: A | Discharge status: Skilled Nursing Facility | Payer: Commercial Managed Care - HMO | Source: Ambulatory Visit | Attending: Internal Medicine | Admitting: Internal Medicine

## 2014-10-24 ENCOUNTER — Other Ambulatory Visit: Payer: Self-pay | Admitting: Pharmacist

## 2014-10-24 ENCOUNTER — Encounter: Payer: Self-pay | Admitting: Vascular Surgery

## 2014-10-24 ENCOUNTER — Ambulatory Visit (INDEPENDENT_AMBULATORY_CARE_PROVIDER_SITE_OTHER): Payer: Commercial Managed Care - HMO | Admitting: Internal Medicine

## 2014-10-24 ENCOUNTER — Ambulatory Visit: Payer: Commercial Managed Care - HMO

## 2014-10-24 ENCOUNTER — Inpatient Hospital Stay (HOSPITAL_COMMUNITY): Payer: Commercial Managed Care - HMO

## 2014-10-24 VITALS — BP 141/99 | HR 78 | Temp 97.9°F | Ht 75.0 in | Wt 147.5 lb

## 2014-10-24 DIAGNOSIS — I5022 Chronic systolic (congestive) heart failure: Secondary | ICD-10-CM | POA: Insufficient documentation

## 2014-10-24 DIAGNOSIS — N400 Enlarged prostate without lower urinary tract symptoms: Secondary | ICD-10-CM

## 2014-10-24 DIAGNOSIS — Z9981 Dependence on supplemental oxygen: Secondary | ICD-10-CM | POA: Insufficient documentation

## 2014-10-24 DIAGNOSIS — R4781 Slurred speech: Secondary | ICD-10-CM | POA: Insufficient documentation

## 2014-10-24 DIAGNOSIS — I5042 Chronic combined systolic (congestive) and diastolic (congestive) heart failure: Secondary | ICD-10-CM | POA: Diagnosis present

## 2014-10-24 DIAGNOSIS — R0602 Shortness of breath: Secondary | ICD-10-CM

## 2014-10-24 DIAGNOSIS — E43 Unspecified severe protein-calorie malnutrition: Secondary | ICD-10-CM | POA: Diagnosis not present

## 2014-10-24 DIAGNOSIS — R911 Solitary pulmonary nodule: Secondary | ICD-10-CM | POA: Insufficient documentation

## 2014-10-24 DIAGNOSIS — J449 Chronic obstructive pulmonary disease, unspecified: Secondary | ICD-10-CM | POA: Diagnosis not present

## 2014-10-24 DIAGNOSIS — F1210 Cannabis abuse, uncomplicated: Secondary | ICD-10-CM | POA: Insufficient documentation

## 2014-10-24 DIAGNOSIS — R627 Adult failure to thrive: Secondary | ICD-10-CM | POA: Diagnosis not present

## 2014-10-24 DIAGNOSIS — N183 Chronic kidney disease, stage 3 (moderate): Secondary | ICD-10-CM | POA: Diagnosis not present

## 2014-10-24 DIAGNOSIS — G458 Other transient cerebral ischemic attacks and related syndromes: Secondary | ICD-10-CM

## 2014-10-24 DIAGNOSIS — Z950 Presence of cardiac pacemaker: Secondary | ICD-10-CM | POA: Diagnosis present

## 2014-10-24 DIAGNOSIS — Z48812 Encounter for surgical aftercare following surgery on the circulatory system: Secondary | ICD-10-CM | POA: Diagnosis not present

## 2014-10-24 DIAGNOSIS — Z86718 Personal history of other venous thrombosis and embolism: Secondary | ICD-10-CM | POA: Diagnosis not present

## 2014-10-24 DIAGNOSIS — F329 Major depressive disorder, single episode, unspecified: Secondary | ICD-10-CM | POA: Insufficient documentation

## 2014-10-24 DIAGNOSIS — E785 Hyperlipidemia, unspecified: Secondary | ICD-10-CM | POA: Diagnosis not present

## 2014-10-24 DIAGNOSIS — M25551 Pain in right hip: Secondary | ICD-10-CM

## 2014-10-24 DIAGNOSIS — I480 Paroxysmal atrial fibrillation: Secondary | ICD-10-CM | POA: Diagnosis not present

## 2014-10-24 DIAGNOSIS — N529 Male erectile dysfunction, unspecified: Secondary | ICD-10-CM | POA: Diagnosis not present

## 2014-10-24 DIAGNOSIS — K219 Gastro-esophageal reflux disease without esophagitis: Secondary | ICD-10-CM | POA: Diagnosis not present

## 2014-10-24 DIAGNOSIS — I481 Persistent atrial fibrillation: Secondary | ICD-10-CM | POA: Diagnosis not present

## 2014-10-24 DIAGNOSIS — I213 ST elevation (STEMI) myocardial infarction of unspecified site: Secondary | ICD-10-CM | POA: Diagnosis not present

## 2014-10-24 DIAGNOSIS — I10 Essential (primary) hypertension: Secondary | ICD-10-CM | POA: Diagnosis present

## 2014-10-24 DIAGNOSIS — R918 Other nonspecific abnormal finding of lung field: Secondary | ICD-10-CM | POA: Diagnosis not present

## 2014-10-24 DIAGNOSIS — N179 Acute kidney failure, unspecified: Secondary | ICD-10-CM | POA: Diagnosis present

## 2014-10-24 DIAGNOSIS — R42 Dizziness and giddiness: Secondary | ICD-10-CM

## 2014-10-24 DIAGNOSIS — F1010 Alcohol abuse, uncomplicated: Secondary | ICD-10-CM | POA: Diagnosis not present

## 2014-10-24 DIAGNOSIS — Z7901 Long term (current) use of anticoagulants: Secondary | ICD-10-CM

## 2014-10-24 DIAGNOSIS — I129 Hypertensive chronic kidney disease with stage 1 through stage 4 chronic kidney disease, or unspecified chronic kidney disease: Secondary | ICD-10-CM | POA: Diagnosis not present

## 2014-10-24 DIAGNOSIS — R29810 Facial weakness: Secondary | ICD-10-CM | POA: Insufficient documentation

## 2014-10-24 DIAGNOSIS — Z8673 Personal history of transient ischemic attack (TIA), and cerebral infarction without residual deficits: Secondary | ICD-10-CM | POA: Diagnosis not present

## 2014-10-24 DIAGNOSIS — J441 Chronic obstructive pulmonary disease with (acute) exacerbation: Secondary | ICD-10-CM | POA: Diagnosis present

## 2014-10-24 DIAGNOSIS — F319 Bipolar disorder, unspecified: Secondary | ICD-10-CM | POA: Diagnosis not present

## 2014-10-24 DIAGNOSIS — I252 Old myocardial infarction: Secondary | ICD-10-CM | POA: Insufficient documentation

## 2014-10-24 DIAGNOSIS — M25552 Pain in left hip: Secondary | ICD-10-CM

## 2014-10-24 DIAGNOSIS — I81 Portal vein thrombosis: Secondary | ICD-10-CM

## 2014-10-24 DIAGNOSIS — I4891 Unspecified atrial fibrillation: Secondary | ICD-10-CM | POA: Diagnosis not present

## 2014-10-24 DIAGNOSIS — F17210 Nicotine dependence, cigarettes, uncomplicated: Secondary | ICD-10-CM | POA: Diagnosis not present

## 2014-10-24 DIAGNOSIS — I4811 Longstanding persistent atrial fibrillation: Secondary | ICD-10-CM | POA: Diagnosis present

## 2014-10-24 DIAGNOSIS — Z96643 Presence of artificial hip joint, bilateral: Secondary | ICD-10-CM | POA: Diagnosis not present

## 2014-10-24 DIAGNOSIS — I130 Hypertensive heart and chronic kidney disease with heart failure and stage 1 through stage 4 chronic kidney disease, or unspecified chronic kidney disease: Secondary | ICD-10-CM

## 2014-10-24 DIAGNOSIS — N189 Chronic kidney disease, unspecified: Secondary | ICD-10-CM

## 2014-10-24 DIAGNOSIS — R0600 Dyspnea, unspecified: Secondary | ICD-10-CM | POA: Diagnosis present

## 2014-10-24 HISTORY — DX: Hyperlipidemia, unspecified: E78.5

## 2014-10-24 LAB — COMPREHENSIVE METABOLIC PANEL
ALBUMIN: 3 g/dL — AB (ref 3.5–5.0)
ALT: 68 U/L — AB (ref 17–63)
AST: 74 U/L — ABNORMAL HIGH (ref 15–41)
Alkaline Phosphatase: 56 U/L (ref 38–126)
Anion gap: 9 (ref 5–15)
BILIRUBIN TOTAL: 0.7 mg/dL (ref 0.3–1.2)
BUN: 34 mg/dL — ABNORMAL HIGH (ref 6–20)
CHLORIDE: 110 mmol/L (ref 101–111)
CO2: 24 mmol/L (ref 22–32)
CREATININE: 1.48 mg/dL — AB (ref 0.61–1.24)
Calcium: 8.9 mg/dL (ref 8.9–10.3)
GFR calc Af Amer: 54 mL/min — ABNORMAL LOW (ref >60)
GFR calc non Af Amer: 46 mL/min — ABNORMAL LOW (ref >60)
Glucose, Bld: 86 mg/dL (ref 65–99)
POTASSIUM: 4.6 mmol/L (ref 3.5–5.1)
Sodium: 143 mmol/L (ref 135–145)
Total Protein: 6.4 g/dL — ABNORMAL LOW (ref 6.5–8.1)

## 2014-10-24 LAB — CBC WITH DIFFERENTIAL/PLATELET
BASOS ABS: 0 10*3/uL (ref 0.0–0.1)
Basophils Relative: 0 % (ref 0–1)
EOS PCT: 4 % (ref 0–5)
Eosinophils Absolute: 0.3 10*3/uL (ref 0.0–0.7)
HCT: 38.3 % — ABNORMAL LOW (ref 39.0–52.0)
Hemoglobin: 12.6 g/dL — ABNORMAL LOW (ref 13.0–17.0)
LYMPHS PCT: 34 % (ref 12–46)
Lymphs Abs: 3.1 10*3/uL (ref 0.7–4.0)
MCH: 29.9 pg (ref 26.0–34.0)
MCHC: 32.9 g/dL (ref 30.0–36.0)
MCV: 91 fL (ref 78.0–100.0)
MONO ABS: 0.7 10*3/uL (ref 0.1–1.0)
Monocytes Relative: 8 % (ref 3–12)
NEUTROS PCT: 54 % (ref 43–77)
Neutro Abs: 5 10*3/uL (ref 1.7–7.7)
Platelets: 320 10*3/uL (ref 150–400)
RBC: 4.21 MIL/uL — AB (ref 4.22–5.81)
RDW: 14.4 % (ref 11.5–15.5)
WBC: 9.1 10*3/uL (ref 4.0–10.5)

## 2014-10-24 LAB — GLUCOSE, CAPILLARY: GLUCOSE-CAPILLARY: 85 mg/dL (ref 65–99)

## 2014-10-24 LAB — MAGNESIUM: Magnesium: 2 mg/dL (ref 1.7–2.4)

## 2014-10-24 LAB — BRAIN NATRIURETIC PEPTIDE: B Natriuretic Peptide: 633 pg/mL — ABNORMAL HIGH (ref 0.0–100.0)

## 2014-10-24 LAB — DIGOXIN LEVEL: DIGOXIN LVL: 0.3 ng/mL — AB (ref 0.8–2.0)

## 2014-10-24 LAB — POCT INR: INR: 2.1

## 2014-10-24 MED ORDER — BUPROPION HCL 75 MG PO TABS
75.0000 mg | ORAL_TABLET | Freq: Two times a day (BID) | ORAL | Status: DC
  Administered 2014-10-24 – 2014-10-27 (×6): 75 mg via ORAL
  Filled 2014-10-24 (×8): qty 1

## 2014-10-24 MED ORDER — METOPROLOL TARTRATE 50 MG PO TABS
75.0000 mg | ORAL_TABLET | Freq: Two times a day (BID) | ORAL | Status: DC
  Administered 2014-10-24 – 2014-10-25 (×2): 75 mg via ORAL
  Filled 2014-10-24 (×3): qty 1

## 2014-10-24 MED ORDER — METOPROLOL TARTRATE 75 MG PO TABS: 75.0000 mg | ORAL_TABLET | Freq: Three times a day (TID) | ORAL | Status: DC

## 2014-10-24 MED ORDER — RAMIPRIL 2.5 MG PO CAPS
2.5000 mg | ORAL_CAPSULE | Freq: Two times a day (BID) | ORAL | Status: DC
  Administered 2014-10-24: 2.5 mg via ORAL
  Filled 2014-10-24 (×3): qty 1

## 2014-10-24 MED ORDER — SODIUM CHLORIDE 0.9 % IJ SOLN
3.0000 mL | Freq: Two times a day (BID) | INTRAMUSCULAR | Status: DC
  Administered 2014-10-24 – 2014-10-25 (×2): 10 mL via INTRAVENOUS
  Administered 2014-10-25 – 2014-10-27 (×4): 3 mL via INTRAVENOUS

## 2014-10-24 MED ORDER — WARFARIN SODIUM 7.5 MG PO TABS: 7.5000 mg | ORAL_TABLET | ORAL | Status: DC

## 2014-10-24 MED ORDER — ASPIRIN EC 81 MG PO TBEC
81.0000 mg | DELAYED_RELEASE_TABLET | Freq: Every day | ORAL | Status: DC
  Administered 2014-10-25 – 2014-10-27 (×3): 81 mg via ORAL
  Filled 2014-10-24 (×4): qty 1

## 2014-10-24 MED ORDER — TIOTROPIUM BROMIDE MONOHYDRATE 18 MCG IN CAPS
18.0000 ug | ORAL_CAPSULE | Freq: Every day | RESPIRATORY_TRACT | Status: DC
  Administered 2014-10-25 – 2014-10-27 (×3): 18 ug via RESPIRATORY_TRACT
  Filled 2014-10-24: qty 5

## 2014-10-24 MED ORDER — OXYCODONE-ACETAMINOPHEN 10-325 MG PO TABS
1.0000 | ORAL_TABLET | Freq: Four times a day (QID) | ORAL | Status: DC | PRN
Start: 2014-10-24 — End: 2014-10-24

## 2014-10-24 MED ORDER — WARFARIN SODIUM 5 MG PO TABS
5.0000 mg | ORAL_TABLET | ORAL | Status: DC
  Filled 2014-10-24: qty 1

## 2014-10-24 MED ORDER — ROSUVASTATIN CALCIUM 20 MG PO TABS: 20.0000 mg | ORAL_TABLET | Freq: Every day | ORAL | Status: DC

## 2014-10-24 MED ORDER — OXYCODONE-ACETAMINOPHEN 5-325 MG PO TABS
1.0000 | ORAL_TABLET | Freq: Four times a day (QID) | ORAL | Status: DC | PRN
  Administered 2014-10-24: 1 via ORAL
  Filled 2014-10-24: qty 1

## 2014-10-24 MED ORDER — PANTOPRAZOLE SODIUM 40 MG PO TBEC
80.0000 mg | DELAYED_RELEASE_TABLET | Freq: Every day | ORAL | Status: DC
  Administered 2014-10-25 – 2014-10-27 (×3): 80 mg via ORAL
  Filled 2014-10-24 (×3): qty 2

## 2014-10-24 MED ORDER — OXYCODONE HCL 5 MG PO TABS
5.0000 mg | ORAL_TABLET | Freq: Four times a day (QID) | ORAL | Status: DC | PRN
  Filled 2014-10-24: qty 1

## 2014-10-24 MED ORDER — OXYCODONE-ACETAMINOPHEN 5-325 MG PO TABS
2.0000 | ORAL_TABLET | Freq: Once | ORAL | Status: AC
  Administered 2014-10-24: 1 via ORAL
  Filled 2014-10-24: qty 2

## 2014-10-24 MED ORDER — WARFARIN SODIUM 7.5 MG PO TABS
7.5000 mg | ORAL_TABLET | Freq: Once | ORAL | Status: AC
  Administered 2014-10-24: 7.5 mg via ORAL
  Filled 2014-10-24: qty 1

## 2014-10-24 MED ORDER — ADULT MULTIVITAMIN W/MINERALS CH
1.0000 | ORAL_TABLET | Freq: Every day | ORAL | Status: DC
  Administered 2014-10-24 – 2014-10-27 (×4): 1 via ORAL
  Filled 2014-10-24 (×4): qty 1

## 2014-10-24 MED ORDER — WARFARIN - PHARMACIST DOSING INPATIENT
Freq: Every day | Status: DC
  Administered 2014-10-25 – 2014-10-26 (×2)

## 2014-10-24 MED ORDER — ALBUTEROL SULFATE (2.5 MG/3ML) 0.083% IN NEBU: 3.0000 mL | INHALATION_SOLUTION | Freq: Four times a day (QID) | RESPIRATORY_TRACT | Status: DC | PRN

## 2014-10-24 MED ORDER — ENSURE ENLIVE PO LIQD
237.0000 mL | Freq: Two times a day (BID) | ORAL | Status: DC
  Administered 2014-10-25 – 2014-10-27 (×6): 237 mL via ORAL

## 2014-10-24 NOTE — Progress Notes (Signed)
Patient ID: [name], male   DOB: 12-25-1945, 69 y.o.   MRN: [id number]   Subjective:   HPI: [name] is a 69 y.o. with a past medical history below presents for hospital follow-up visit.  Reason(s) for visit:  Dizziness and shortness of breath: Patient reports increased dizziness and shortness of breath over the past several days. He was discharged from the hospital on 10/19/2014 after 5 days of hospitalization with persistent Afib and RVR complicated with multiple emboli resulting into STEMI, TIA, and acute lower extremity ischemia, status post embolectomy in the setting of nonadherence to anticoagulation therapy. He states that since returning home, he had been very dizzy and barely able to sit on the bed side. He can not perform his ADLs due to associated shortness of breath and dizziness. He denies orthopnea, PND or lower extremity edema. No chest pain. No fevers, chills or rigors. However, patient states that has not been able to eat and since Saturday. He has only takes ensure 3 cans daily and has only eaten one sandwich since Saturday. He denies nausea or vomiting. Patient lives alone as his son is a Administrator and he is rarely home. He has been unable to take care of himself.    Past Medical History  Diagnosis Date  . Hypertension   . Chronic systolic heart failure     a. NICM - EF 35% with normal cors 2003. b. Last echo 11/2012 - EF 25-30%.  . Depression   . GERD (gastroesophageal reflux disease)   . Portal vein thrombosis   . Avascular necrosis of bones of both hips     Status post bilateral hip replacements  . Gastritis   . Alcohol abuse   . Erectile dysfunction   . Bipolar disorder   . Hydrocele, unspecified   . Intermittent claudication   . Lung nodule     Chest CT scan on 12/14/2008 showed a nodular opacity at the left lung base felt to most likely represent scarring.  Follow-up chest CT scan on 06/02/2010 showed parenchymal scarring in the left apex, left   lower lobe, and lingula; some of this scarring at the left base had a nodular appearance, unchanged. Chest 09/2012 - stable.  . 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  Thyroid scan 08/28/2009 was normal with no focal areas of abnormal increased or decreased activity seen; the uptake of I 131 sodium iodide at 24 hours was 5.7%.  TSH and free T4 normalized by 08/16/2009.  . Intestinal obstruction   . COPD (chronic obstructive pulmonary disease)   . Clostridium difficile colitis   . E coli bacteremia   . DVT (deep venous thrombosis)     He has hypercoagulability with multiple prior DVTs  . Pleural plaque     H/o asbestos exposure. Chest CT on 06/02/2010 showed stable extensive calcified pleural plaques involving the left hemithorax, consistent with asbestos related pleural disease.  . Weight loss     Normal colonoscopy by Dr. Olevia Perches on 11/06/2010.  . Tachycardia-bradycardia syndrome     a. 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 b. [employer] gen change 2010.  [name] PAF (paroxysmal atrial fibrillation)     Paroxysmal, failed medical therapy with amiodarone but has done very well with multaq;  d/c 2/2 to recurrent AFib;  Tikosyn load c/b Hyperkalemia during admx 05/2013 => rate control strategy  . Thrombosis     History of arterial and venous thrombosis including portal vein thrombosis, deep  vein thrombosis, and superior mesenteric artery thrombosis.  . Noncompliance   . Thrombocytopenia   . Osteoarthritis   . Spondylosis   . Anxiety   . CKD (chronic kidney disease)     Renal U/S 12/04/2009 showed no pathological findings. Labs 12/04/2009 include normal ESR, C3, C4; neg ANA; SPEP showed nonspecific increase in the alpha-2 region with no M-spike; UPEP showed no monoclonal free light chains; urine IFE showed polyclonal increase in feree Kappa and/or free Lambda light chains. Baseline Cr reported 1.7-2.5.  . STEMI (ST elevation myocardial infarction) 10/11/2014     ROS: Respiratory: Denies SOB, DOE, cough, chest tightness, and wheezing.  CVS: No chest pain, palpitations and leg swelling.  GI: No abdominal pain, nausea, vomiting, bloody stools GU: No dysuria, frequency, hematuria, or flank pain.  MSK: No myalgias, back pain, joint swelling, arthralgias  Psych: No depression symptoms. No SI or SA.    Objective:  Physical Exam: Filed Vitals:   10/24/14 1602 10/24/14 1615 10/24/14 1617 10/24/14 1619  BP: 118/97 124/92 121/101 141/99  Pulse: 70 129 114 78  Temp: 97.9 F (36.6 C)     TempSrc: Oral     Height: '6\' 3"'  (1.905 m)     Weight: 147 lb 8 oz (66.906 kg)     SpO2: 100%     Not orthostatic General: frail man, with malnutrition, no acute distress.  HEENT: Normal oral mucosa. MMM.  Lungs: CTA bilaterally. No wheezing. Heart: RRR; no extra sounds or murmurs  Abdomen: Non-distended, normal bowel sounds, soft, nontender; no hepatosplenomegaly  Extremities: No pedal edema. No joint swelling or tenderness. Neurologic: Normal EOM,  Alert and oriented x3. No obvious neurologic/cranial nerve deficits.  Assessment & Plan:  Discussed case with Dr Ellwood Dense and Dr Lynnae January. See problem based charting for assessment and plan.    [name] will need to be admitted given his overall condition. Currently, the etiology of his shortness of breath and dizziness is not very clear, but differentials include medication induced, or even a possibility of a posterior circulation stroke. He was recently started on digoxin and this could be a side effect of digoxin toxicity. He appears frail and he has failure to thrive with inability to do his ADLs. Very poor social support. He has h/o medication noncompliance. It is unclear whether skilled nursing placement was considered during his hospitalization. Clearly he cannot take care of himself, even though he has a life alert and management by [facility]. He will need to be admitted for evaluation for the etiology of his  symptoms, EKG will be helpful to rule out digoxin toxicity, and consideration for skilled nursing placement. Other services and outpatient may including short-term personal aide, Meals on Wheels and home PT, OT can be considered. I discussed the patient with the on-call resident. Patient will be admitted to a telemetry bed. He will require labs including a CMP, CBC, digoxin level and possible consultation with cardiology.  Signed:  Jessee Avers, MD PGY-3 Internal Medicine Teaching Service Pager: [phone] 10/24/2014, 5:34 PM

## 2014-10-24 NOTE — Patient Instructions (Signed)
Patient instructed to take medications as defined in the Anti-coagulation Track section of this encounter.  Patient instructed to take today's dose.  Patient was reminded to continue taking warfarin from his 2.5mg  strength warfarin bottle and to NOT take warfarin from his 5mg  strength labeled bottle.  Patient verbalized understanding of these instructions.

## 2014-10-24 NOTE — H&P (Signed)
Date: 10/25/2014               Patient Name:  [name] MRN: [id number]  DOB: 07-31-1945 Age / Sex: 69 y.o., male   PCP: Bertha Stakes, MD         Medical Service: Internal Medicine Teaching Service         Attending Physician: Dr. Oval Linsey, MD    First Contact: Dr. Trudee Kuster  Pager: [phone]  Second Contact: Dr. Gordy Levan Pager: [phone]       After Hours (After 5p/  First Contact Pager: [phone]  weekends / holidays): Second Contact Pager: [phone]   Chief Complaint: DOE and dizziness  History of Present Illness: Pt is a 38 M w/ PMHx of HTN, CHF, depression, portal vein thrombosis, NI cardiomyopathy, tachy-brady syndrome now w/ ICD, recurrent DVT, COPD, and afib who presents with DOE and dizziness. Pt was recently admitted on 5/24-6/1 for persistent atrial fibrillation with RVR that was complicated by multiple emboli w/ resulting STEMI, TIA, and acute left LE ischemia s/p embolectomy due to non compliance w/ anticoagulation therapy. Since discharge pt has felt dizzy and SOB w/ ambulation. He gets dizzy when he sits up and while walking. He states he feels unsteady and feels like his legs are about to give out. He previously could walk to the the door of the hospital room but now cannot w/o getting SOB. He has been using his albuterol inhaler more and states it takes a while before it helps his SOB. Along w/ DOE and gets chest palpitations. He is also complaining of a constant headache located around his head that is worsened w/ exertion. SOB and dizziness resolve w/ laying still. Pt states he had some confusion on his admission last month which has worsening. He will forget where he is at sometimes. He has been worked up in the past for weight loss and pt states he had a meal 2 days ago on Saturday and then the next meal he had was morning of admission which was a tuna sandwich that he ate half of. He is also drinking nutritional supplements w/ meals. He has a home health nurse who arranges his  medications in a pillbox and states he has been compliant with meds.   Documented digoxin toxicity in 2009. Unable to find this per chart review. He was last on digoxin 0.119m daily up until 09/09/2008 when it was d/c by his cardiologist Dr. ARayann Hemansince he was maintained in sinus rhythm with amiodarone. During his admission last month he was restarted on digoxin and pt last took it this morning around 10:00am.   Meds: Current Facility-Administered Medications  Medication Dose Route Frequency Provider Last Rate Last Dose  . albuterol (PROVENTIL) (2.5 MG/3ML) 0.083% nebulizer solution 3 mL  3 mL Inhalation Q6H PRN Ejiroghene EArlyce Dice MD      . aspirin EC tablet 81 mg  81 mg Oral Daily Ejiroghene E Emokpae, MD      . buPROPion (WELLBUTRIN) tablet 75 mg  75 mg Oral BID EBethena Roys MD   75 mg at 10/24/14 2258  . feeding supplement (ENSURE ENLIVE) (ENSURE ENLIVE) liquid 237 mL  237 mL Oral BID BM Ejiroghene E Emokpae, MD      . metoprolol tartrate (LOPRESSOR) tablet 75 mg  75 mg Oral BID EBethena Roys MD   75 mg at 10/24/14 2257  . multivitamin with minerals tablet 1 tablet  1 tablet Oral Daily Ejiroghene EArlyce Dice MD  1 tablet at 10/24/14 2253  . pantoprazole (PROTONIX) EC tablet 80 mg  80 mg Oral Daily Ejiroghene E Emokpae, MD      . ramipril (ALTACE) capsule 2.5 mg  2.5 mg Oral BID Ejiroghene Arlyce Dice, MD   2.5 mg at 10/24/14 2258  . sodium chloride 0.9 % injection 3 mL  3 mL Intravenous Q12H Ejiroghene E Emokpae, MD   10 mL at 10/24/14 2254  . tiotropium (SPIRIVA) inhalation capsule 18 mcg  18 mcg Inhalation Daily Ejiroghene E Emokpae, MD      . warfarin (COUMADIN) tablet 5 mg  5 mg Oral Once per day on Tue Thu Sat Kris Mouton, [facility]      . [START ON 10/26/2014] warfarin (COUMADIN) tablet 7.5 mg  7.5 mg Oral Once per day on Sun Mon Wed Fri Kris Mouton, [facility]      . Warfarin - Pharmacist Dosing Inpatient   Does not apply [place], [facility]         Allergies: Allergies as of 10/24/2014 - Review Complete 10/24/2014  Allergen Reaction Noted  . Digoxin and related Other (See Comments) 05/17/2013  . Penicillins Rash    Past Medical History  Diagnosis Date  . Hypertension   . Chronic systolic heart failure     a. NICM - EF 35% with normal cors 2003. b. Last echo 11/2012 - EF 25-30%.  . Depression   . GERD (gastroesophageal reflux disease)   . Portal vein thrombosis   . Avascular necrosis of bones of both hips     Status post bilateral hip replacements  . Gastritis   . Alcohol abuse   . Erectile dysfunction   . Bipolar disorder   . Hydrocele, unspecified   . Intermittent claudication   . Lung nodule     Chest CT scan on 12/14/2008 showed a nodular opacity at the left lung base felt to most likely represent scarring.  Follow-up chest CT scan on 06/02/2010 showed parenchymal scarring in the left apex, left  lower lobe, and lingula; some of this scarring at the left base had a nodular appearance, unchanged. Chest 09/2012 - stable.  . 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  Thyroid scan 08/28/2009 was normal with no focal areas of abnormal increased or decreased activity seen; the uptake of I 131 sodium iodide at 24 hours was 5.7%.  TSH and free T4 normalized by 08/16/2009.  . Intestinal obstruction   . COPD (chronic obstructive pulmonary disease)   . Clostridium difficile colitis   . E coli bacteremia   . DVT (deep venous thrombosis)     He has hypercoagulability with multiple prior DVTs  . Pleural plaque     H/o asbestos exposure. Chest CT on 06/02/2010 showed stable extensive calcified pleural plaques involving the left hemithorax, consistent with asbestos related pleural disease.  . Weight loss     Normal colonoscopy by Dr. Olevia Perches on 11/06/2010.  . Tachycardia-bradycardia syndrome     a. 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 b. St. Jude gen change 2010.  [name] PAF (paroxysmal atrial fibrillation)      Paroxysmal, failed medical therapy with amiodarone but has done very well with multaq;  d/c 2/2 to recurrent AFib;  Tikosyn load c/b Hyperkalemia during admx 05/2013 => rate control strategy  . 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  . Noncompliance   .  Thrombocytopenia   . Osteoarthritis   . Spondylosis   . Anxiety   . CKD (chronic kidney disease)     Renal U/S 12/04/2009 showed no pathological findings. Labs 12/04/2009 include normal ESR, C3, C4; neg ANA; SPEP showed nonspecific increase in the alpha-2 region with no M-spike; UPEP showed no monoclonal free light chains; urine IFE showed polyclonal increase in feree Kappa and/or free Lambda light chains. Baseline Cr reported 1.7-2.5.  . STEMI (ST elevation myocardial infarction) 10/11/2014  . Hyperlipemia   . CHF (congestive heart failure)   . On home oxygen therapy     "3L prn" (10/24/2014)   Past Surgical History  Procedure Laterality Date  . Total hip arthroplasty Right 03/08/2008     By Dr. Hiram Comber  . Tee without cardioversion N/A 05/27/2013    Procedure: TRANSESOPHAGEAL ECHOCARDIOGRAM (TEE);  Surgeon: Candee Furbish, MD;  Location: [facility] ENDOSCOPY;  Service: Cardiovascular;  Laterality: N/A;  . Cardioversion N/A 05/27/2013    Procedure: CARDIOVERSION;  Surgeon: Candee Furbish, MD;  Location: [facility] ENDOSCOPY;  Service: Cardiovascular;  Laterality: N/A;  . Cardiac catheterization  2003    Nl cors, EF 35%  . Embolectomy Right 12/10/2013    Procedure: Right Femoral Embolectomy; Fasciotomy Right Lower Leg with Intraoperative arteriogram;  Surgeon: Rosetta Posner, MD;  Location: [place];  Service: Vascular;  Laterality: Right;  . Intraoperative arteriogram Right 12/10/2013    Procedure: INTRA OPERATIVE ARTERIOGRAM;  Surgeon: Rosetta Posner, MD;  Location: [place];  Service: Vascular;  Laterality: Right;  . Fasciotomy closure Right 12/15/2013     Procedure: LEG FASCIOTOMY CLOSURE;  Surgeon: Rosetta Posner, MD;  Location: [facility] OR;  Service: Vascular;  Laterality: Right;  . Embolectomy Left 10/12/2014    Procedure: Left Femoral Thrombectomy with intraoperative arteriogram;  Surgeon: Rosetta Posner, MD;  Location: [facility] OR;  Service: Vascular;  Laterality: Left;  . Hemiarthroplasty hip Left 09/09/2002    bipolar; Dr. Lafayette Dragon  . Joint replacement    . Insert / replace / remove pacemaker  02/2003    Dr. Roe Rutherford, [place] pulse generator identity SR model [id number] serial [id number]; gen change by Greggory Brandy  . Insert / replace / remove pacemaker  06/17/2008    Lead and generator change w/ St. Jude Medical Tendril ST model [id number] (serial  [id number]).        Family History  Problem Relation Age of Onset  . Hypertension Mother   . Cancer Brother     Unsure of type.  . Asthma Mother   . Coronary artery disease Mother   . Colon cancer Neg Hx   . Lung cancer Neg Hx   . Prostate cancer Neg Hx   . Stroke      mother  . Heart attack      brother and sister ?age    History   Social History  . Marital Status: Widowed    Spouse Name: N/A  . Number of Children: 10  . Years of Education: N/A   Occupational History  . RETIRED    Social History Main Topics  . Smoking status: Current Every Day Smoker -- 0.33 packs/day for 54 years    Types: Cigarettes  . Smokeless tobacco: Never Used  . Alcohol Use: 0.0 oz/week    0 Standard drinks or equivalent per week     Comment: 6/6//2016 "h/o etoh abuse; stopped drinking ~ 2012"  . Drug Use: 1.00 per week    Special: Marijuana  Comment: 10/24/2014 "none since 10/11/2014; did smoke marijuana ~ 1 time/month"  . Sexual Activity: Not Currently   Other Topics Concern  . Not on file   Social History Narrative   Works as a Architect part time   Smoker 1 pack last 1.5 days tobacco, smokes marijuana    Denies EtOH x 4 years (on 10/12/12)   9 kids    From [place]   [place] Veteran    12  grade education              Review of Systems  Constitutional: Positive for malaise/fatigue. Negative for fever and chills.       Night sweats x 2 months, decreased appetite  Eyes: Positive for blurred vision.  Respiratory: Negative for cough and wheezing.   Cardiovascular: Positive for palpitations. Negative for chest pain, orthopnea and leg swelling.  Gastrointestinal: Negative for heartburn, nausea, vomiting, abdominal pain and diarrhea.  Genitourinary: Negative for dysuria.  Musculoskeletal: Positive for myalgias and joint pain (b/l hip pain).  Neurological: Positive for dizziness, weakness and headaches.  Endo/Heme/Allergies: Bruises/bleeds easily.    Physical Exam: Blood pressure 121/92, pulse 114, temperature 98.2 F (36.8 C), temperature source Oral, resp. rate 21, height _0  (1.905 m), weight 147 lb 4.8 oz (66.815 kg), SpO2 100 %. Physical Exam  Constitutional: He is oriented to person, place, and time. No distress.  HENT:  Mouth/Throat: Oropharynx is clear and moist.  Eyes: Conjunctivae and EOM are normal. Pupils are equal, round, and reactive to light. No scleral icterus.  Neck: Neck supple.  Cardiovascular: An irregularly irregular rhythm present. Tachycardia present.   Pulmonary/Chest: Effort normal and breath sounds normal. No respiratory distress. He has no wheezes. He has no rales. He exhibits no tenderness.  Abdominal: Soft. Bowel sounds are normal. He exhibits no distension. There is no tenderness.  Musculoskeletal: He exhibits no edema.  Lymphadenopathy:    He has no cervical adenopathy.  Neurological: He is alert and oriented to person, place, and time. He has normal strength. No cranial nerve deficit. Coordination (unable to assess, pt refusing to stand up due to left heel callus pain) abnormal.  Able to do heel to shin and nose to finger w/o difficulty  Skin: Skin is warm and dry.    Lab results: Basic Metabolic Panel:  Recent Labs  10/24/14 2117   NA 143  K 4.6  CL 110  CO2 24  GLUCOSE 86  BUN 34*  CREATININE 1.48*  CALCIUM 8.9  MG 2.0   Liver Function Tests:  Recent Labs  10/24/14 2117  AST 74*  ALT 68*  ALKPHOS 56  BILITOT 0.7  PROT 6.4*  ALBUMIN 3.0*   No results for input(s): LIPASE, AMYLASE in the last 72 hours. No results for input(s): AMMONIA in the last 72 hours. CBC:  Recent Labs  10/24/14 2117  WBC 9.1  NEUTROABS 5.0  HGB 12.6*  HCT 38.3*  MCV 91.0  PLT 320   Cardiac Enzymes: No results for input(s): CKTOTAL, CKMB, CKMBINDEX, TROPONINI in the last 72 hours. BNP: No results for input(s): PROBNP in the last 72 hours. D-Dimer: No results for input(s): DDIMER in the last 72 hours. CBG:  Recent Labs  10/24/14 2157  GLUCAP 85   Hemoglobin A1C: No results for input(s): HGBA1C in the last 72 hours. Fasting Lipid Panel: No results for input(s): CHOL, HDL, LDLCALC, TRIG, CHOLHDL, LDLDIRECT in the last 72 hours. Thyroid Function Tests: No results for input(s): TSH, T4TOTAL, FREET4, T3FREE,  THYROIDAB in the last 72 hours. Anemia Panel: No results for input(s): VITAMINB12, FOLATE, FERRITIN, TIBC, IRON, RETICCTPCT in the last 72 hours. Coagulation:  Recent Labs  10/24/14 1844  INR 2.10   CXR 2view: Lungs are hyperinflated. There is left basilar pleural plaque. There is left basilar airspace disease unchanged compared with the prior exams likely reflecting scarring. There is no pneumothorax. There is a nodular opacity in the right lower lung which reflect superimposition of the inferior aspect of the scapula overlying a posterior rib. There is stable cardiomegaly. There is thoracic aortic atherosclerosis. There is a dual lead AICD. There is no acute osseous abnormality.  IMPRESSION: No active cardiopulmonary disease.  Chronic changes at the left lung base  Assessment & Plan by Problem: Active Problems:   Dyspnea   69 y/o male w/ hx of a fib w/ RVR, HTN, COPD, and medication non  compliance who presents w/ DOE and dizziness which are both chronic complaints for this patient through chart review. However concern regarding pt's living situation as he lives alone and reports leaving his bed 3 times w/in past 5 days, dizziness, and SOB since d/c on 6/1 s/p admission for afib w/ RVR w/ STEMI, TIA, and acute LE.  Dyspnea on exertion-- Pt has had DOE since discharge from hospital last month. This has caused him to stay in bed likely leading to deconditioning. Could also be due to his afib rvr as his HR have been variable up to 129. Could also be PE however unlikely as he is on coumadin and INR 2.10. Could be a MI, he denies chest pain but he was found to have a STEMI during last admission w/o complaints of chest pain.  Anemia unlikely as hbg 12.6 around his b/l. CXR negative for infxn. He has a CHF w/ EF 30-35% but does not have any signs/symptoms of volume overload.  - PT/OT consulted  dizziness--Pt complains of dizziness when sitting up. Orthostatic vitals revealed appropriate increase in BP however HR dropped from 129 lying to 78 standing reflecting autonomic dysfunction.  Previously admitted for evaluation of light headedness and fall on 10/12/12 that was believed to be due to orthostasis in the setting of THC. He was also noted to have severe calluses on both feet which may have contributed gait instability. He was seen in clinic on 01/25/14 ( 4 days after d/c from hospital)  for dizziness and his metoprolol was decreased from 50 to 62m bid due to low BPs. He endorses poor appetite and symptoms of orthostatic hypotension. Likely dizziness due to dehydration. Per nurse he finished his dinner tray and ensure.  - orthostatic vitals in the am.  - can consider having pt's pacemaker interrogated for arrhythmias - PT/OT consult  Failure to thrive-- He has lost >5% of body weight w/in last year (172lbs on 11/18/2013, now 147lbs) and he has depression. BMI 18.5 w/ albumin of 3.0, HIV negative.  States he has decreased appetite and will only eat a few bites of his food. Denies difficulty eating (no teeth or dentures) and difficulty swallowing. Since d/c from hospital last week has only left his bed 3 times due to SOB and dizziness. Colonoscopy 2012 WNL.  - PT/OT consult - SW consulted for SNF, pt lives at home alone.   Atrial fibrillation w/RVR-- previously on amiodarone but it was discontinued as it was associated with thyroiditis in 2011. Intolerant to amiodarone and flecainide. CHA2DS2-VASc Score of 6 is equal to 9.7 % stroke rate/year. INR 2.10 today. Has  renal insufficiency, not a candidate for rhythm control per cards. Digoxin level low at 0.3.  - can consult cards tomorrow for AVN ablation due to pacemaker  - continue lopressor 74m BID can consider increased metoprolol dose if HR remained elevated. Manual check by RN found pulse to be 84, previously 114 on tele. - cont digoxin 0.06234mdaily, asa 8111mand coumadin per pharm  Elevated liver enzymes: AST 74 ALT 68. AST 72 on 5/23 likely due to STEMI and ALT WNL. No abdominal pain. Likely due to initiation of crestor last month.  - hepatitis panel ordered in the am, refusing further blood draws - hold crestor, can consider pravastatin  - CK  HTN - continue home metoprolol and ramipril   Chronic systolic CHF w/ ICD-- ECHO last month revealed EF of 30-35% w/ diffuse hypokinesis in setting of STEMI.  - needs repeat ECHO in 2 months per cardiology - continue home metoprolol and ramipril   CKD 3-- b/l Cr 1.6. Cr on admission 1.48 - will cont to monitor  COPD - cont home spiriva and albuterol prn  HLD--  - cont rosuvastatin 50m36m  BPH -- cont home flomax 0.4mg 33m Depression - cont home wellbutrin 75mg 23m Chronic hip pain-- on percocet 10-325 q6h prn for pain. Refusing to take oxy 5mg + 4mcocet 5-325mg pe77marm formulary. Given 2 tabs of percocet 5-325mg onc16m- can discuss pain management w/ pt in the am.    GERD-- - on home prilosec 40mg at h38m will give protonix 80mg qd wh34madmitted.   DVT ppx--coumadin per pharm  FEN - reg diet, refusing heart healthy diet - can consider BMET in the am if needed.   Code: FULL, confirmed w/ pt at bedside  Dispo: Disposition is deferred at this time, awaiting improvement of current medical problems.  The patient does have a current PCP (Jerry JoinBertha [facility]oes [facility].  The patient does not have transportation limitations that hinder transportation to clinic appointments.  Signed: Diana M TruNorman Herrlich16, 12:00 AM

## 2014-10-24 NOTE — Progress Notes (Signed)
Internal Medicine Clinic Attending  Case discussed with Dr. Kazibwe at the time of the visit.  We reviewed the resident's history and exam and pertinent patient test results.  I agree with the assessment, diagnosis, and plan of care documented in the resident's note. 

## 2014-10-24 NOTE — Telephone Encounter (Signed)
Agree. Thanks. Let me know if he needs anything.

## 2014-10-24 NOTE — Progress Notes (Signed)
Patient admitted to room 5w14,oriented to room ,call bell and bed in low postion bed . alarm on . Patient stating he is going to leave AMA and that he better not be ordered a Heart Healthy Diet called MD on call for Internal Medicines [phone]

## 2014-10-24 NOTE — Patient Outreach (Signed)
[facility]) Care Management  [place]   10/24/2014  OLLEN RAO [birth year] [id number]  Subjective: [name] is a 69 y.o. male who was referred to [place] for medication management. He is interested in [place] because he sometimes forgets to pick up his medications on time.  He also has some confusion over what his medications are for.  I called the patient today and he reported that he is still interested in [employer]. He reports that he has an appointment with his primary care physician today. He denies any other issues with his medications. I offer to come to his home to help him manage his medications but he is not interested. I tell him that there is a pharmacist at his doctor's office and he is open to talking about his medications with her.   Objective:   Current Medications: Current Outpatient Prescriptions  Medication Sig Dispense Refill  . albuterol (VENTOLIN HFA) 108 (90 BASE) MCG/ACT inhaler Inhale 2 puffs into the lungs every 6 (six) hours as needed for wheezing or shortness of breath. 1 Inhaler 3  . aspirin EC 81 MG EC tablet Take 1 tablet (81 mg total) by mouth daily.    . betamethasone dipropionate (DIPROLENE) 0.05 % cream Apply to affected areas on back and arm once daily for 2 weeks.  Do not use on face or in skin folds. 45 g 0  . buPROPion (WELLBUTRIN) 75 MG tablet Take 1 tablet (75 mg total) by mouth 2 (two) times daily. 60 tablet 5  . digoxin 62.5 MCG TABS Take 0.0625 mg by mouth daily. 30 tablet 2  . lactose free nutrition (BOOST PLUS) LIQD Take 237 mLs by mouth 3 (three) times daily with meals. (Patient not taking: Reported on 08/10/2014) 90 Can 0  . Metoprolol Tartrate 75 MG TABS Take 75 mg by mouth 3 (three) times daily. 90 tablet 2  . omeprazole (PRILOSEC) 20 MG capsule Take 2 capsules (40 mg total) by mouth daily. 60 capsule 5  . oxyCODONE-acetaminophen (PERCOCET) 10-325 MG per tablet Take 1 tablet by  mouth every 4 (four) hours as needed for pain. Do not take more than 5 doses in a 24 hour period. 150 tablet 0  . ramipril (ALTACE) 2.5 MG capsule Take 1 capsule (2.5 mg total) by mouth 2 (two) times daily. 60 capsule 2  . rosuvastatin (CRESTOR) 20 MG tablet Take 1 tablet (20 mg total) by mouth daily at 6 PM. 30 tablet 2  . senna-docusate ([employer] SENNA PLUS) 8.6-50 MG per tablet Take 1 tablet by mouth daily as needed (for constipation). 30 tablet 1  . tamsulosin (FLOMAX) 0.4 MG CAPS capsule Take 1 capsule (0.4 mg total) by mouth daily. 30 capsule 3  . tiotropium (SPIRIVA) 18 MCG inhalation capsule Place 1 capsule (18 mcg total) into inhaler and inhale daily. 30 capsule 5  . triamcinolone cream (KENALOG) 0.1 % Apply 1 application topically 2 (two) times daily. Use on affected areas on chest for 2 weeks. 30 g 1  . warfarin (COUMADIN) 2.5 MG tablet Take 2-3 tablets (5-7.5 mg total) by mouth See admin instructions. Pt takes 5mg  every other day - alternates w/ 7mg  every other day 30 tablet 2   No current facility-administered medications for this visit.    Functional Status: In your present state of health, do you have any difficulty performing the following activities: 10/11/2014 10/11/2014  Hearing? - N  Vision? - Y  Difficulty  concentrating or making decisions? - Y  Walking or climbing stairs? - Y  Dressing or bathing? - N  Doing errands, shopping? N -    Fall/Depression Screening: PHQ 2/9 Scores 08/10/2014 07/28/2014 06/22/2014 06/01/2014 01/25/2014 01/17/2014 10/05/2013  PHQ - 2 Score 0 0 0 0 2 2 0  PHQ- 9 Score - - - - 8 15 -    Assessment: 1. Medication management: patient is not interested in having a [facility] pharmacist come to his home at this time.  2. Mail order: patient has [employer] and is interested in using mail order to help with adherence to refilling his medications.   Plan: 1. Medication management: Patient does not want [facility] pharmacy home visits at this time but is open to seeing the  pharmacist at the [facility]. Will send an inbox message to Mannie Stabile, PharmD, to alert her to this patient.  2. Mail order: Patient has an appointment with primary care physician this afternoon. Instructed him to ask the provider to send all of his prescriptions to [facility]. I will review his chart tomorrow to make sure that all of his prescription were sent to mail order and if not, I will contact the patient for follow up.   Nicoletta Ba, PharmD, [place] Resident [employer] [phone]

## 2014-10-24 NOTE — Progress Notes (Signed)
Anti-Coagulation Progress Note  [name] is a 69 y.o. male who is currently on an anti-coagulation regimen.    RECENT RESULTS: Recent results are below, the most recent result is correlated with a dose of 7.5 mg alternating with 5mg  every other day since discharge on 1-JUN-16 (he has a 2.5mg  strength warfarin prescription provided at discharge).  Lab Results  Component Value Date   INR 2.10 10/24/2014   INR 1.91* 10/19/2014   INR 2.10* 10/18/2014    ANTI-COAG DOSE: Anticoagulation Dose Instructions as of 10/24/2014      [name] Tue Wed Thu Fri Sat   New Dose 7.5 mg 7.5 mg 5 mg 7.5 mg 5 mg 7.5 mg 5 mg    Description        Patient's prescription strength warfarin was changed FROM 5mg  to 2.5mg  at discharge from hospital. We looked at bottle in his possession to confirm this. He has a bottle labeled 2.5mg  warfarin and has been taking alternating 7.5mg  (3x2.5mg ) with 5mg  (2x2.5mg ) since discharge from hospital.        ANTICOAG SUMMARY: Anticoagulation Episode Summary    Current INR goal   Next INR check 10/31/2014  INR from last check   Weekly max dose   Target end date   INR check location   Preferred lab   Send INR reminders to ANTICOAG IMP   Indications  Long-term (current) use of anticoagulants [Z79.01] Atrila fibrillation (Resolved) [I48.91] 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]        Comments       Anticoagulation Care Providers    Provider Role Specialty Phone number   Bertha Stakes, MD  Internal Medicine [phone]      ANTICOAG TODAY: Anticoagulation Summary as of 10/24/2014    INR goal   Selected INR   Next INR check 10/31/2014  Target end date    Indications  Long-term (current) use of anticoagulants [Z79.01] Atrila fibrillation (Resolved) [I48.91] 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]      Anticoagulation Episode Summary    INR check location    Preferred lab    Send INR reminders to ANTICOAG IMP   Comments     Anticoagulation Care Providers    Provider  Role Specialty Phone number   Bertha Stakes, MD  Internal Medicine [phone]      PATIENT INSTRUCTIONS: Patient Instructions  Patient instructed to take medications as defined in the Anti-coagulation Track section of this encounter.  Patient instructed to take today's dose.  Patient was reminded to continue taking warfarin from his 2.5mg  strength warfarin bottle and to NOT take warfarin from his 5mg  strength labeled bottle.  Patient verbalized understanding of these instructions.       FOLLOW-UP Return in 7 days (on 10/31/2014) for Follow up INR at 3:30PM.  Jorene Guest, III Pharm.D., CACP

## 2014-10-24 NOTE — Patient Outreach (Signed)
[facility]) Care Management  10/24/2014  [name] 1946-01-27 [id number]  CSW received a new referral on patient from [facility] Liaison with [facility], indicating that patient would benefit from social work services and resources.  [name] further reported that patient currently lives at home with his son, [name]; however, [name] is a truck driver and gone out of town several days/nights during the week.  [name] indicated that patient would benefit from home care services ,such as meal preparation, light housekeeping duties, running errands, etc.  [name] believes that patient would benefit from home care assistance through his [employer].  Patient is non-compliant with medications so a referral has also been made to pharmacy for medication management and possible delivery of medications through the mail. CSW made an initial attempt to try and contact patient today to perform phone assessment, as well as assess and assist with social work needs and services; however, patient was unavailable at the time of CSW's call.  A HIPAA compliant message was left.  CSW then performed a thorough review of patient's EMR (Electronic Medical Record) in EP[employer], noting that patient was contacted by CSW's colleagues, [name], Pharmacist and [name], RNCM, both with [facility] today.  Shortly thereafter, patient followed up with his Primary Care Physician, [name], then presented to the Emergency Department at [facility] for Atrial Fibrillation. CSW will encouraged [name] to contact CSW when patient is discharged from the hospital.  [name], [place], MSW, [facility] [place], [place], [place] [address] [name] [phone]

## 2014-10-24 NOTE — Patient Outreach (Signed)
[name]) Care Management  10/24/2014  [name] September 06, 1945 [id number]   RN spoke with pt today and reiterated on the [name] program and available services. Discussed pt's management of care and verified pt continues to take his medications as directed for his COPD. Pt also indicated he has spoken wit the [facility] today [name]) and visited his doctor's office. Pt continues to states he is experiencing some SOB but tolerable (doctor is aware).  States [facility] continues to be involved with weekly visits and has visited today and completed blood pressure, temperature and checked my heart, states pt. All number were good with no problems. RN reviewed COPD and once again offered direct home visits for [facility] services for further education on his medical condition and how to monitor and improve his health concerning his COPD. Pt receptive and the initial home visit was arranged for 6/14 at 1:30pm for enrollment into the [name] program and services. Pt continues to work with [facility] for mail order medication and has again consulted with [name] for any assistance. No other issues at this time mentioned as RN will follow up accordingly.  Travis Mina, RN Care Management Coordinator [facility] Main Office [phone]

## 2014-10-24 NOTE — Progress Notes (Signed)
ANTICOAGULATION CONSULT NOTE - Initial Consult  Pharmacy Consult for coumadin Indication: atrial fibrillation  Allergies  Allergen Reactions  . Digoxin And Related Other (See Comments)    Dig toxicity 2009  . Penicillins Rash    Patient Measurements: Height: '6\' 3"'  (190.5 cm) Weight: 147 lb 4.8 oz (66.815 kg) IBW/kg (Calculated) : 84.5  Vital Signs: Temp: 98.2 F (36.8 C) (06/06 1829) Temp Source: Oral (06/06 1829) BP: 152/95 mmHg (06/06 1829) Pulse Rate: 112 (06/06 1829)  Labs:  Recent Labs  10/24/14 1844  INR 2.10    Estimated Creatinine Clearance: 49.6 mL/min (by C-G formula based on Cr of 1.31).   Medical History: Past Medical History  Diagnosis Date  . Hypertension   . Chronic systolic heart failure     a. NICM - EF 35% with normal cors 2003. b. Last echo 11/2012 - EF 25-30%.  . Depression   . GERD (gastroesophageal reflux disease)   . Portal vein thrombosis   . Avascular necrosis of bones of both hips     Status post bilateral hip replacements  . Gastritis   . Alcohol abuse   . Erectile dysfunction   . Bipolar disorder   . Hydrocele, unspecified   . Intermittent claudication   . Lung nodule     Chest CT scan on 12/14/2008 showed a nodular opacity at the left lung base felt to most likely represent scarring.  Follow-up chest CT scan on 06/02/2010 showed parenchymal scarring in the left apex, left  lower lobe, and lingula; some of this scarring at the left base had a nodular appearance, unchanged. Chest 09/2012 - stable.  . 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  Thyroid scan 08/28/2009 was normal with no focal areas of abnormal increased or decreased activity seen; the uptake of I 131 sodium iodide at 24 hours was 5.7%.  TSH and free T4 normalized by 08/16/2009.  . Intestinal obstruction   . COPD (chronic obstructive pulmonary disease)   . Clostridium difficile colitis   . E coli bacteremia   . DVT (deep venous  thrombosis)     He has hypercoagulability with multiple prior DVTs  . Pleural plaque     H/o asbestos exposure. Chest CT on 06/02/2010 showed stable extensive calcified pleural plaques involving the left hemithorax, consistent with asbestos related pleural disease.  . Weight loss     Normal colonoscopy by Dr. Olevia Perches on 11/06/2010.  . Tachycardia-bradycardia syndrome     a. 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 b. St. Jude gen change 2010.  [name] PAF (paroxysmal atrial fibrillation)     Paroxysmal, failed medical therapy with amiodarone but has done very well with multaq;  d/c 2/2 to recurrent AFib;  Tikosyn load c/b Hyperkalemia during admx 05/2013 => rate control strategy  . 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  . Noncompliance   . Thrombocytopenia   . Osteoarthritis   . Spondylosis   . Anxiety   . CKD (chronic kidney disease)     Renal U/S 12/04/2009 showed no pathological findings. Labs 12/04/2009 include normal ESR, C3, C4; neg ANA; SPEP showed nonspecific increase in the alpha-2 region with no M-spike; UPEP showed no monoclonal free light chains; urine IFE showed polyclonal increase in feree Kappa and/or free Lambda light chains. Baseline Cr reported 1.7-2.5.  . STEMI (ST elevation myocardial infarction) 10/11/2014    Medications:  Scheduled:  .  aspirin EC  81 mg Oral Daily  . buPROPion  75 mg Oral BID  . Metoprolol Tartrate  75 mg Oral TID  . multivitamin with minerals  1 tablet Oral Daily  . pantoprazole  80 mg Oral Daily  . ramipril  2.5 mg Oral BID  . [START ON 10/25/2014] rosuvastatin  20 mg Oral q1800  . sodium chloride  3 mL Intravenous Q12H  . tiotropium  18 mcg Inhalation Daily    Assessment: 69 yo male here with SOB and dizziness. He is on coumadin  PTA for afib and pharmacy has been consulted to dose. INR today= 2.1 (today in clinic)  Home coumadin dose: 7.36m daily  except 557mTTSa (last dose taken 10/23/14)  Goal of Therapy:  INR 2-3 Monitor platelets by anticoagulation protocol: Yes   Plan:  -Continue coumadin at home regimen -Daily PT/INR  AnHildred LaserPharm D 10/24/2014 9:15 PM

## 2014-10-24 NOTE — Telephone Encounter (Signed)
Call from [facility] with  [facility]  - [phone]  Nurse called after seeing pt on Friday. She reports that pt is c/o being lightheaded, SOB and headache when he sits on side of bed or stands up. Orthostatic BP are stable.  sats good. Today it is better and he is able to get up and shower. She is also asking for orders for PT, OT and SW.  They can be verbal.   Pt is scheduled for OV today at 3:45 if Dr [name] Rieger.  I called to see if he is coming in and no answer. I will call him back and get more info if he does not show up for OV.

## 2014-10-25 ENCOUNTER — Ambulatory Visit: Payer: Commercial Managed Care - HMO | Admitting: Vascular Surgery

## 2014-10-25 DIAGNOSIS — I4891 Unspecified atrial fibrillation: Secondary | ICD-10-CM | POA: Diagnosis not present

## 2014-10-25 DIAGNOSIS — Z681 Body mass index (BMI) 19 or less, adult: Secondary | ICD-10-CM

## 2014-10-25 DIAGNOSIS — R0600 Dyspnea, unspecified: Secondary | ICD-10-CM

## 2014-10-25 DIAGNOSIS — I130 Hypertensive heart and chronic kidney disease with heart failure and stage 1 through stage 4 chronic kidney disease, or unspecified chronic kidney disease: Secondary | ICD-10-CM | POA: Diagnosis not present

## 2014-10-25 DIAGNOSIS — G8929 Other chronic pain: Secondary | ICD-10-CM

## 2014-10-25 DIAGNOSIS — I749 Embolism and thrombosis of unspecified artery: Secondary | ICD-10-CM

## 2014-10-25 DIAGNOSIS — I481 Persistent atrial fibrillation: Secondary | ICD-10-CM | POA: Diagnosis not present

## 2014-10-25 DIAGNOSIS — R945 Abnormal results of liver function studies: Secondary | ICD-10-CM

## 2014-10-25 DIAGNOSIS — K219 Gastro-esophageal reflux disease without esophagitis: Secondary | ICD-10-CM | POA: Diagnosis not present

## 2014-10-25 DIAGNOSIS — I482 Chronic atrial fibrillation: Secondary | ICD-10-CM | POA: Diagnosis not present

## 2014-10-25 DIAGNOSIS — F17210 Nicotine dependence, cigarettes, uncomplicated: Secondary | ICD-10-CM

## 2014-10-25 DIAGNOSIS — I129 Hypertensive chronic kidney disease with stage 1 through stage 4 chronic kidney disease, or unspecified chronic kidney disease: Secondary | ICD-10-CM | POA: Diagnosis not present

## 2014-10-25 DIAGNOSIS — Z95810 Presence of automatic (implantable) cardiac defibrillator: Secondary | ICD-10-CM

## 2014-10-25 DIAGNOSIS — R42 Dizziness and giddiness: Secondary | ICD-10-CM

## 2014-10-25 DIAGNOSIS — N183 Chronic kidney disease, stage 3 (moderate): Secondary | ICD-10-CM | POA: Diagnosis not present

## 2014-10-25 DIAGNOSIS — I5022 Chronic systolic (congestive) heart failure: Secondary | ICD-10-CM | POA: Diagnosis not present

## 2014-10-25 DIAGNOSIS — Z7901 Long term (current) use of anticoagulants: Secondary | ICD-10-CM | POA: Diagnosis not present

## 2014-10-25 DIAGNOSIS — M25559 Pain in unspecified hip: Secondary | ICD-10-CM

## 2014-10-25 DIAGNOSIS — E43 Unspecified severe protein-calorie malnutrition: Secondary | ICD-10-CM | POA: Diagnosis not present

## 2014-10-25 DIAGNOSIS — Z79899 Other long term (current) drug therapy: Secondary | ICD-10-CM

## 2014-10-25 DIAGNOSIS — Z7951 Long term (current) use of inhaled steroids: Secondary | ICD-10-CM

## 2014-10-25 DIAGNOSIS — F329 Major depressive disorder, single episode, unspecified: Secondary | ICD-10-CM | POA: Diagnosis not present

## 2014-10-25 DIAGNOSIS — J449 Chronic obstructive pulmonary disease, unspecified: Secondary | ICD-10-CM | POA: Diagnosis not present

## 2014-10-25 DIAGNOSIS — E785 Hyperlipidemia, unspecified: Secondary | ICD-10-CM | POA: Diagnosis not present

## 2014-10-25 LAB — PROTIME-INR
INR: 1.89 — ABNORMAL HIGH (ref 0.00–1.49)
Prothrombin Time: 21.7 seconds — ABNORMAL HIGH (ref 11.6–15.2)

## 2014-10-25 MED ORDER — DIGOXIN 125 MCG PO TABS
0.1250 mg | ORAL_TABLET | Freq: Once | ORAL | Status: AC
  Administered 2014-10-25: 0.125 mg via ORAL
  Filled 2014-10-25: qty 1

## 2014-10-25 MED ORDER — OXYCODONE-ACETAMINOPHEN 5-325 MG PO TABS
1.0000 | ORAL_TABLET | ORAL | Status: DC | PRN
  Administered 2014-10-25 – 2014-10-27 (×7): 1 via ORAL
  Filled 2014-10-25 (×7): qty 1

## 2014-10-25 MED ORDER — RAMIPRIL 2.5 MG PO CAPS
2.5000 mg | ORAL_CAPSULE | Freq: Every day | ORAL | Status: DC
  Administered 2014-10-25 – 2014-10-26 (×2): 2.5 mg via ORAL
  Filled 2014-10-25 (×3): qty 1

## 2014-10-25 MED ORDER — DIGOXIN 0.0625 MG HALF TABLET
0.0625 mg | ORAL_TABLET | Freq: Every day | ORAL | Status: DC
Start: 2014-10-25 — End: 2014-10-27
  Administered 2014-10-25 – 2014-10-27 (×3): 0.0625 mg via ORAL
  Filled 2014-10-25 (×3): qty 1

## 2014-10-25 MED ORDER — METOPROLOL TARTRATE 50 MG PO TABS
75.0000 mg | ORAL_TABLET | Freq: Three times a day (TID) | ORAL | Status: DC
  Administered 2014-10-25 – 2014-10-26 (×4): 75 mg via ORAL
  Filled 2014-10-25 (×8): qty 1

## 2014-10-25 MED ORDER — ROSUVASTATIN CALCIUM 20 MG PO TABS
20.0000 mg | ORAL_TABLET | Freq: Every day | ORAL | Status: DC
  Administered 2014-10-26: 20 mg via ORAL
  Filled 2014-10-25 (×2): qty 1

## 2014-10-25 MED ORDER — OXYCODONE HCL 5 MG PO TABS
5.0000 mg | ORAL_TABLET | ORAL | Status: DC | PRN
  Administered 2014-10-25 – 2014-10-27 (×7): 5 mg via ORAL
  Filled 2014-10-25 (×7): qty 1

## 2014-10-25 MED ORDER — WARFARIN SODIUM 7.5 MG PO TABS
7.5000 mg | ORAL_TABLET | Freq: Once | ORAL | Status: AC
  Administered 2014-10-25: 7.5 mg via ORAL
  Filled 2014-10-25: qty 1

## 2014-10-25 MED ORDER — OXYCODONE-ACETAMINOPHEN 5-325 MG PO TABS
2.0000 | ORAL_TABLET | Freq: Once | ORAL | Status: AC
  Administered 2014-10-25: 2 via ORAL
  Filled 2014-10-25: qty 2

## 2014-10-25 MED ORDER — ONDANSETRON HCL 4 MG PO TABS: 4.0000 mg | ORAL_TABLET | Freq: Three times a day (TID) | ORAL | Status: DC | PRN

## 2014-10-25 NOTE — Progress Notes (Signed)
[facility]  Patient Status: Active (receiving services up to time of hospitalization)  [facility] is providing the following services: RN  If patient discharges after hours, please call [phone].   Consepcion Hearing 10/25/2014, 9:37 AM

## 2014-10-25 NOTE — Progress Notes (Signed)
PT Cancellation Note  Patient Details Name: [name] MRN: [id number] DOB: Jan 07, 1946   Cancelled Treatment:    Reason Eval/Treat Not Completed: Other (comment) (pt with strict bedrest orders). Will check back later for incr activity orders.   Mabell Esguerra 10/25/2014, 10:40 AM  Suanne Marker PT [phone]

## 2014-10-25 NOTE — Consult Note (Signed)
   [facility] Inpatient Consult   10/25/2014  [name] 02/15/1946 [id number] Patient is currently active with [facility] for chronic disease management services.  Patient has been engaged by a [employer], Pharmacist,  and LCSW.  Our community based plan of care has focused on disease management and community resource support. Patient verbalizes being interested in getting his medications mail ordered.  He states he is having trouble getting food daily and is now considering going to a skilled facility because of difficulty ambulating and not having strength to care for himself. Patient states, "I thought I could do it when I got back home to find out I was too weak and dizzy to do much of anything."  Patient will receive a post discharge transition of care call and will be evaluated for monthly home visits for assessments and disease process education.  Will follow up with the  Inpatient Care Management team and make aware of patient's potential need for some skilled rehab prior to discharging back home and  that [facility] following. Of note, [facility] Care Management services does not replace or interfere with any services that are arranged by inpatient case management or social work.  For additional questions or referrals please contact: Natividad Brood, RN BSN [facility] Liaison  [phone] business mobile phone

## 2014-10-25 NOTE — Progress Notes (Signed)
Initial Nutrition Assessment  DOCUMENTATION CODES:  Severe malnutrition in context of chronic illness, Underweight  INTERVENTION:  Ensure Enlive (each supplement provides 350kcal and 20 grams of protein)  NUTRITION DIAGNOSIS:  Malnutrition related to chronic illness as evidenced by severe depletion of body fat, severe depletion of muscle mass, percent weight loss.  GOAL:  Patient will meet greater than or equal to 90% of their needs   MONITOR:  PO intake, Supplement acceptance, Labs, Weight trends, Skin, I & O's  REASON FOR ASSESSMENT:  Malnutrition Screening Tool    ASSESSMENT: Pt is a 37 M w/ PMHx of HTN, CHF, depression, portal vein thrombosis, NI cardiomyopathy, tachy-brady syndrome now w/ ICD, recurrent DVT, COPD, and afib who presents with DOE and dizziness. Pt was recently admitted on 5/24-6/1 for persistent atrial fibrillation with RVR that was complicated by multiple emboli w/ resulting STEMI, TIA, and acute left LE ischemia s/p embolectomy due to non compliance w/ anticoagulation therapy  Pt admitted with rapid ventricular response. Noted that pt was recently admitted for a similar complaint; he left AMA last week (10/19/14).   Pt was asleep at time of visit. Reviewed RD notes from previous admission. Pt has hx of ongoing wt loss and poor appetite. Per wt hx, pt has lost 24# (16.3%) within the past 5 months and 32# (17.8%) within the past year. He has hx of malnutrition, which is ongoing.   Pt is currently consuming 100% of his meals. He is noncompliant with diet recommendations and requests a regular diet order. He consumes Ensure supplements, but does not consume at home due to cost.   Unable to complete Nutrition-Focused physical exam at this time. Exam completed during previous admissions on 10/13/14. Findings were severe fat depletion, severe muscle depletion, and no edema. Suspect no changes to exam at this time.   Height:  Ht Readings from Last 1 Encounters:   10/24/14 6\' 3"  (1.905 m)    Weight:  Wt Readings from Last 1 Encounters:  10/24/14 147 lb 4.8 oz (66.815 kg)    Ideal Body Weight:  89 kg  Wt Readings from Last 10 Encounters:  10/24/14 147 lb 4.8 oz (66.815 kg)  10/24/14 147 lb 8 oz (66.906 kg)  08/10/14 154 lb 1.6 oz (69.899 kg)  07/28/14 153 lb 8 oz (69.627 kg)  06/22/14 162 lb 4.8 oz (73.619 kg)  06/01/14 171 lb 8 oz (77.792 kg)  01/25/14 169 lb 9.6 oz (76.93 kg)  01/21/14 166 lb 6.4 oz (75.479 kg)  01/17/14 163 lb (73.936 kg)  01/04/14 171 lb 8 oz (77.792 kg)    BMI:  Body mass index is 18.41 kg/(m^2). Underweight  Estimated Nutritional Needs:  Kcal:  2100-2300  Protein:  105-115 grams  Fluid:  2.1-2.3 L  Skin:  Reviewed, no issues  Diet Order:  Diet regular Room service appropriate?: Yes; Fluid consistency:: Thin  EDUCATION NEEDS:  No education needs identified at this time   Intake/Output Summary (Last 24 hours) at 10/25/14 1410 Last data filed at 10/25/14 1319  Gross per 24 hour  Intake    460 ml  Output    525 ml  Net    -65 ml    Last BM:  10/24/14  Trinda Harlacher A. Jimmye Norman, RD, LDN, CDE Pager: [phone] After hours Pager: [phone]

## 2014-10-25 NOTE — Consult Note (Signed)
CARDIOLOGY CONSULT NOTE   Patient ID: [name] MRN: [id number], DOB/AGE: 09-17-1945   Admit date: 10/24/2014 Date of Consult: 10/25/2014   Primary Physician: Axel Filler, MD Primary Cardiologist: Dr. Rayann Heman  Pt. Profile  70 year old male with chronic atrial fibrillation readmitted with rapid ventricular response, weakness and dizziness.  He recently had been a patient 5/24 until 10/19/14.  Problem List  Past Medical History  Diagnosis Date  . Hypertension   . Chronic systolic heart failure     a. NICM - EF 35% with normal cors 2003. b. Last echo 11/2012 - EF 25-30%.  . Depression   . GERD (gastroesophageal reflux disease)   . Portal vein thrombosis   . Avascular necrosis of bones of both hips     Status post bilateral hip replacements  . Gastritis   . Alcohol abuse   . Erectile dysfunction   . Bipolar disorder   . Hydrocele, unspecified   . Intermittent claudication   . Lung nodule     Chest CT scan on 12/14/2008 showed a nodular opacity at the left lung base felt to most likely represent scarring.  Follow-up chest CT scan on 06/02/2010 showed parenchymal scarring in the left apex, left  lower lobe, and lingula; some of this scarring at the left base had a nodular appearance, unchanged. Chest 09/2012 - stable.  . 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  Thyroid scan 08/28/2009 was normal with no focal areas of abnormal increased or decreased activity seen; the uptake of I 131 sodium iodide at 24 hours was 5.7%.  TSH and free T4 normalized by 08/16/2009.  . Intestinal obstruction   . COPD (chronic obstructive pulmonary disease)   . Clostridium difficile colitis   . E coli bacteremia   . DVT (deep venous thrombosis)     He has hypercoagulability with multiple prior DVTs  . Pleural plaque     H/o asbestos exposure. Chest CT on 06/02/2010 showed stable extensive calcified pleural plaques involving the left hemithorax, consistent  with asbestos related pleural disease.  . Weight loss     Normal colonoscopy by Dr. Olevia Perches on 11/06/2010.  . Tachycardia-bradycardia syndrome     a. 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 b. St. Jude gen change 2010.  [name] PAF (paroxysmal atrial fibrillation)     Paroxysmal, failed medical therapy with amiodarone but has done very well with multaq;  d/c 2/2 to recurrent AFib;  Tikosyn load c/b Hyperkalemia during admx 05/2013 => rate control strategy  . 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  . Noncompliance   . Thrombocytopenia   . Osteoarthritis   . Spondylosis   . Anxiety   . CKD (chronic kidney disease)     Renal U/S 12/04/2009 showed no pathological findings. Labs 12/04/2009 include normal ESR, C3, C4; neg ANA; SPEP showed nonspecific increase in the alpha-2 region with no M-spike; UPEP showed no monoclonal free light chains; urine IFE showed polyclonal increase in feree Kappa and/or free Lambda light chains. Baseline Cr reported 1.7-2.5.  . STEMI (ST elevation myocardial infarction) 10/11/2014  . Hyperlipemia   . CHF (congestive heart failure)   . On home oxygen therapy     "3L prn" (10/24/2014)    Past Surgical History  Procedure Laterality Date  . Total hip arthroplasty Right 03/08/2008     By Dr. Hiram Comber  . Tee without  cardioversion N/A 05/27/2013    Procedure: TRANSESOPHAGEAL ECHOCARDIOGRAM (TEE);  Surgeon: Candee Furbish, MD;  Location: [facility] ENDOSCOPY;  Service: Cardiovascular;  Laterality: N/A;  . Cardioversion N/A 05/27/2013    Procedure: CARDIOVERSION;  Surgeon: Candee Furbish, MD;  Location: [facility] ENDOSCOPY;  Service: Cardiovascular;  Laterality: N/A;  . Cardiac catheterization  2003    Nl cors, EF 35%  . Embolectomy Right 12/10/2013    Procedure: Right Femoral Embolectomy; Fasciotomy Right Lower Leg with Intraoperative arteriogram;  Surgeon: Rosetta Posner, MD;  Location: [facility];  Service:  Vascular;  Laterality: Right;  . Intraoperative arteriogram Right 12/10/2013    Procedure: INTRA OPERATIVE ARTERIOGRAM;  Surgeon: Rosetta Posner, MD;  Location: [place];  Service: Vascular;  Laterality: Right;  . Fasciotomy closure Right 12/15/2013    Procedure: LEG FASCIOTOMY CLOSURE;  Surgeon: Rosetta Posner, MD;  Location: [facility] OR;  Service: Vascular;  Laterality: Right;  . Embolectomy Left 10/12/2014    Procedure: Left Femoral Thrombectomy with intraoperative arteriogram;  Surgeon: Rosetta Posner, MD;  Location: [facility] OR;  Service: Vascular;  Laterality: Left;  . Hemiarthroplasty hip Left 09/09/2002    bipolar; Dr. Lafayette Dragon  . Joint replacement    . Insert / replace / remove pacemaker  02/2003    Dr. Roe Rutherford, [place] pulse generator identity SR model [id number] serial [id number]; gen change by Greggory Brandy  . Insert / replace / remove pacemaker  06/17/2008    Lead and generator change w/ St. Jude Medical Tendril ST model [id number] (serial  [id number]).          Allergies  Allergies  Allergen Reactions  . Digoxin And Related Other (See Comments)    Dig toxicity 2009  . Penicillins Rash    HPI   This 69 year old gentleman has a history of chronic atrial fibrillation.  He has a past history of tachybradycardia syndrome.  He has a St. Jude dual-chamber pacemaker in placed and is followed by Dr. Rayann Heman.  The patient has a past history of noncompliance.  He was recently admitted on 10/11/14 and discharged on 10/19/14 for persistent atrial fibrillation with RVR that was complicated by multiple emboli with resultant STEMI, TIA, and acute left lower extremity ischemia which required embolectomy by Dr. Donnetta Hutching.  He has a previous intolerance to amiodarone.  Strategy has been rate control and ambulation.  He is not a candidate for antiarrhythmics therapy. Since going home from the hospital the patient states that he has been compliant with his medication.  The home health nurse comes to the home and sits up his  medications for him.  He lives by himself.  He has been eating very poorly and has been out of bed only a few times in the past 5 days.  His INR was therapeutic on admission this time. He returned to the hospital because of weakness and dizziness and a feeling that his heart was racing.  He was short of breath when he would attempt to ambulate.  At home he has been on digoxin 0.0625 mg daily.  His serum digoxin level on admission was low at 0.3.  The patient does have chronic renal insufficiency.  He has a history of having had low blood pressures which limits our ability to increase his beta blocker for rate control.  Inpatient Medications  . aspirin EC  81 mg Oral Daily  . buPROPion  75 mg Oral BID  . digoxin  0.0625 mg Oral Daily  . feeding supplement (ENSURE ENLIVE)  237 mL Oral BID BM  . metoprolol tartrate  75 mg Oral BID  . multivitamin with minerals  1 tablet Oral Daily  . pantoprazole  80 mg Oral Daily  . ramipril  2.5 mg Oral BID  . sodium chloride  3 mL Intravenous Q12H  . tiotropium  18 mcg Inhalation Daily  . warfarin  5 mg Oral Once per day on Tue Thu Sat  . [START ON 10/26/2014] warfarin  7.5 mg Oral Once per day on Sun Mon Wed Fri  . Warfarin - Pharmacist Dosing Inpatient   Does not apply q1800    Family History Family History  Problem Relation Age of Onset  . Hypertension Mother   . Cancer Brother     Unsure of type.  . Asthma Mother   . Coronary artery disease Mother   . Colon cancer Neg Hx   . Lung cancer Neg Hx   . Prostate cancer Neg Hx   . Stroke      mother  . Heart attack      brother and sister ?age      Social History History   Social History  . Marital Status: Widowed    Spouse Name: N/A  . Number of Children: 10  . Years of Education: N/A   Occupational History  . RETIRED    Social History Main Topics  . Smoking status: Current Every Day Smoker -- 0.33 packs/day for 54 years    Types: Cigarettes  . Smokeless tobacco: Never Used  . Alcohol  Use: 0.0 oz/week    0 Standard drinks or equivalent per week     Comment: 6/6//2016 "h/o etoh abuse; stopped drinking ~ 2012"  . Drug Use: 1.00 per week    Special: Marijuana     Comment: 10/24/2014 "none since 10/11/2014; did smoke marijuana ~ 1 time/month"  . Sexual Activity: Not Currently   Other Topics Concern  . Not on file   Social History Narrative   Works as a Architect part time   Smoker 1 pack last 1.5 days tobacco, smokes marijuana    Denies EtOH x 4 years (on 10/12/12)   9 kids    From [place]   [place] Veteran    12 grade education               Review of Systems  General:  No chills, fever, night sweats or weight changes.  Cardiovascular:  No chest pain, dyspnea on exertion, edema, orthopnea, palpitations, paroxysmal nocturnal dyspnea. Dermatological: No rash, lesions/masses Respiratory: No cough, dyspnea Urologic: No hematuria, dysuria Abdominal:   No nausea, vomiting, diarrhea, bright red blood per rectum, melena, or hematemesis Neurologic:  No visual changes, wkns, changes in mental status. All other systems reviewed and are otherwise negative except as noted above.  Physical Exam  Blood pressure 102/74, pulse 90, temperature 97.8 F (36.6 C), temperature source Oral, resp. rate 17, height _0  (1.905 m), weight 147 lb 4.8 oz (66.815 kg), SpO2 99 %.  General: Pleasant, NAD Psych: Normal affect. Neuro: Alert and oriented X 3. Moves all extremities spontaneously. HEENT: Normal  Neck: Supple without bruits or JVD. Lungs:  Resp regular and unlabored, CTA. Heart: Rapid irregular pulse. Abdomen: Soft, non-tender, non-distended, BS + x 4.  Extremities: No clubbing, cyanosis or edema.  Pedal pulses are equivocal.  Labs  No results for input(s): CKTOTAL, CKMB, TROPONINI in the last 72 hours. Lab Results  Component Value Date   WBC 9.1 10/24/2014  HGB 12.6* 10/24/2014   HCT 38.3* 10/24/2014   MCV 91.0 10/24/2014   PLT 320 10/24/2014    Recent  Labs Lab 10/24/14 2117  NA 143  K 4.6  CL 110  CO2 24  BUN 34*  CREATININE 1.48*  CALCIUM 8.9  PROT 6.4*  BILITOT 0.7  ALKPHOS 56  ALT 68*  AST 74*  GLUCOSE 86   Lab Results  Component Value Date   CHOL 92 10/12/2014   HDL 36* 10/12/2014   LDLCALC 50 10/12/2014   TRIG 31 10/12/2014   Lab Results  Component Value Date   DDIMER 2.47* 03/18/2012    Radiology/Studies  X-ray Chest Pa And Lateral  10/24/2014   CLINICAL DATA:  Dyspnea  EXAM: CHEST  2 VIEW  COMPARISON:  None.  FINDINGS: Lungs are hyperinflated. There is left basilar pleural plaque. There is left basilar airspace disease unchanged compared with the prior exams likely reflecting scarring. There is no pneumothorax. There is a nodular opacity in the right lower lung which reflect superimposition of the inferior aspect of the scapula overlying a posterior rib. There is stable cardiomegaly. There is thoracic aortic atherosclerosis. There is a dual lead AICD. There is no acute osseous abnormality.  IMPRESSION: No active cardiopulmonary disease.  Chronic changes at the left lung base.   Electronically Signed   By: Kathreen Devoid   On: 10/24/2014 21:48   Ct Head Wo Contrast  10/11/2014   CLINICAL DATA:  Code stroke. RIGHT-sided facial droop with slurred speech.  EXAM: CT HEAD WITHOUT CONTRAST  TECHNIQUE: Contiguous axial images were obtained from the base of the skull through the vertex without intravenous contrast.  COMPARISON:  CT head 10/12/2012.  FINDINGS: No evidence for acute infarction, hemorrhage, mass lesion, hydrocephalus, or extra-axial fluid. Normal for age cerebral volume. No white matter disease. Calvarium intact. No sinus or mastoid fluid.  IMPRESSION: Negative exam.  No change from priors.  Critical Value/emergent results were called by telephone at the time of interpretation on 10/11/2014 at 1:12 pm to Dr. Janann Colonel , who verbally acknowledged these results.   Electronically Signed   By: Rolla Flatten M.D.   On: 10/11/2014  13:14   Ct Chest W Contrast  10/13/2014   CLINICAL DATA:  69 year old male with unexplained weight loss. Patient with myocardial infarction, CVA, multiple arch O emboli. Acute on chronic kidney disease.  EXAM: CT CHEST, ABDOMEN, AND PELVIS WITH CONTRAST  TECHNIQUE: Multidetector CT imaging of the chest, abdomen and pelvis was performed following the standard protocol during bolus administration of intravenous contrast.  CONTRAST:  22m OMNIPAQUE IOHEXOL 300 MG/ML  SOLN  COMPARISON:  06/02/2013 and prior chest CTs  FINDINGS: CT CHEST FINDINGS  Mediastinum/Nodes: Cardiomegaly again noted. Moderate coronary artery calcifications are identified. The ascending aorta measures 3.7 cm and greatest diameter. No enlarged lymph nodes are identified. There is no evidence of pericardial effusion.  Lungs/Pleura: Dependent and bibasilar atelectasis/ scarring again noted. Scarring within the mid and lower left lung again identified. Patchy ground-glass opacities within the left upper lobe may represent aspiration or for pneumonia. There is no evidence of suspicious mass or nodule. COPD/emphysema again identified. Left posterior pleural calcifications are again identified.  Musculoskeletal: No acute or suspicious abnormalities.  CT ABDOMEN AND PELVIS FINDINGS  Hepatobiliary: The liver and gallbladder are unremarkable. There is no evidence of biliary dilatation.  Pancreas: Unremarkable  Spleen: Unremarkable  Adrenals/Urinary Tract: Moderate bilateral renal atrophy and cortical thinning noted, right greater than left. There is no evidence  of hydronephrosis, renal mass or definite renal calculi.  Stomach/Bowel: There is no evidence of bowel obstruction. No definite bowel wall thickening noted. No dilated bowel loops are present. Hip replacement artifact obscures detail within the pelvis.  Vascular/Lymphatic: No enlarged lymph nodes or abdominal aortic aneurysm identified. Calcifications within the IVC, iliac and femoral veins noted.   Reproductive: Prostate not well visualized.  Other: No free fluid, abscess or pneumoperitoneum.  Musculoskeletal: Mild stranding in the left inguinal region is likely postoperative. Bilateral hip replacements identified. No acute bony abnormalities are identified. Degenerative changes in the lumbar spine and bilateral L5 pars defects again noted.  IMPRESSION: No evidence of primary malignancy.  Patchy left upper lobe ground-glass opacities - possible aspiration versus pneumonia.  Cardiomegaly, coronary artery disease, ectatic ascending thoracic aorta, COPD/emphysema and moderate bilateral renal atrophy.   Electronically Signed   By: Margarette Canada M.D.   On: 10/13/2014 14:10   Ct Abdomen Pelvis W Contrast  10/13/2014   CLINICAL DATA:  69 year old male with unexplained weight loss. Patient with myocardial infarction, CVA, multiple arch O emboli. Acute on chronic kidney disease.  EXAM: CT CHEST, ABDOMEN, AND PELVIS WITH CONTRAST  TECHNIQUE: Multidetector CT imaging of the chest, abdomen and pelvis was performed following the standard protocol during bolus administration of intravenous contrast.  CONTRAST:  84m OMNIPAQUE IOHEXOL 300 MG/ML  SOLN  COMPARISON:  06/02/2013 and prior chest CTs  FINDINGS: CT CHEST FINDINGS  Mediastinum/Nodes: Cardiomegaly again noted. Moderate coronary artery calcifications are identified. The ascending aorta measures 3.7 cm and greatest diameter. No enlarged lymph nodes are identified. There is no evidence of pericardial effusion.  Lungs/Pleura: Dependent and bibasilar atelectasis/ scarring again noted. Scarring within the mid and lower left lung again identified. Patchy ground-glass opacities within the left upper lobe may represent aspiration or for pneumonia. There is no evidence of suspicious mass or nodule. COPD/emphysema again identified. Left posterior pleural calcifications are again identified.  Musculoskeletal: No acute or suspicious abnormalities.  CT ABDOMEN AND PELVIS  FINDINGS  Hepatobiliary: The liver and gallbladder are unremarkable. There is no evidence of biliary dilatation.  Pancreas: Unremarkable  Spleen: Unremarkable  Adrenals/Urinary Tract: Moderate bilateral renal atrophy and cortical thinning noted, right greater than left. There is no evidence of hydronephrosis, renal mass or definite renal calculi.  Stomach/Bowel: There is no evidence of bowel obstruction. No definite bowel wall thickening noted. No dilated bowel loops are present. Hip replacement artifact obscures detail within the pelvis.  Vascular/Lymphatic: No enlarged lymph nodes or abdominal aortic aneurysm identified. Calcifications within the IVC, iliac and femoral veins noted.  Reproductive: Prostate not well visualized.  Other: No free fluid, abscess or pneumoperitoneum.  Musculoskeletal: Mild stranding in the left inguinal region is likely postoperative. Bilateral hip replacements identified. No acute bony abnormalities are identified. Degenerative changes in the lumbar spine and bilateral L5 pars defects again noted.  IMPRESSION: No evidence of primary malignancy.  Patchy left upper lobe ground-glass opacities - possible aspiration versus pneumonia.  Cardiomegaly, coronary artery disease, ectatic ascending thoracic aorta, COPD/emphysema and moderate bilateral renal atrophy.   Electronically Signed   By: JMargarette CanadaM.D.   On: 10/13/2014 14:10   Dg Chest Port 1 View  10/11/2014   CLINICAL DATA:  Shortness of breath.  EXAM: PORTABLE CHEST - 1 VIEW  COMPARISON:  10/11/2014 and 01/17/2014  FINDINGS: Right-sided pacemaker unchanged. Lungs are adequately inflated with stable chronic elevation of the left hemidiaphragm and stable chronic changes over the left lung. Known calcified pleural plaques.  No focal lobar consolidation or effusion cardiomediastinal silhouette and remainder of the exam is unchanged.  IMPRESSION: No active disease.  Chronic stable changes of the left lung. Evidence of asbestos related  pleural disease.   Electronically Signed   By: Marin Olp M.D.   On: 10/11/2014 21:56   Dg Chest Portable 1 View  10/11/2014   CLINICAL DATA:  Right facial droop  EXAM: PORTABLE CHEST - 1 VIEW  COMPARISON:  01/17/2014  FINDINGS: Cardiac shadow is again mildly enlarged. A pacing device is again seen. Chronic left pleural effusion and left basilar changes are noted although they have increased in the interval from the prior exam. The right lung remains clear. The visualized upper abdomen is unremarkable.  IMPRESSION: Increasing chronic changes in the left lung base when compare with the prior exam. Calcified pleural plaques are again noted.   Electronically Signed   By: Inez Catalina M.D.   On: 10/11/2014 14:06   Dg Ang/ext/uni/or Left  10/12/2014   CLINICAL DATA:  Femoral thrombectomy for embolic occlusion in the left lower extremity.  EXAM: LEFT ANG/EXT/UNI/ OR  COMPARISON:  None.  FINDINGS: Intraoperative image shows a normally patent popliteal artery and normally patent visualized anterior tibial, peroneal and posterior tibial arteries to nearly the level of the ankle. No filling defects are identified.  IMPRESSION: No distal occlusions or filling defects identified at the level of the popliteal and tibial arteries.   Electronically Signed   By: Aletta Edouard M.D.   On: 10/12/2014 14:02    ECG  24-Oct-2014 22:10:30 [id number] ROUTINE RECORD  Atrial flutter with variable A-V block Rightward axis Possible Inferior infarct , age undetermined Abnormal ECG  ASSESSMENT AND PLAN  1.  Chronic atrial fibrillation with rapid ventricular response. 2.  History of tachybradycardia syndrome, functioning St. Jude dual-chamber pacemaker in place 3.  Chronic renal insufficiency 4.  History of remote embolus to right femoral artery and recent embolus to left femoral artery, both secondary to previous poor compliance with outpatient anticoagulation regimen. 5.  Past history of  TIAs  Recommendation: Our goal will be rate control and ongoing adequate warfarin anticoagulation.  His digoxin level is low and we will give extra digoxin today and observe response. We will reduce his dose of ramipril in order to allow higher dose of beta blocker if necessary to control heart rate.  We will continue to emphasize importance of good medical compliance following discharge.  Berna Spare MD  10/25/2014, 9:29 AM

## 2014-10-25 NOTE — Progress Notes (Signed)
ANTICOAGULATION CONSULT NOTE - Follow-Up  Pharmacy Consult for coumadin Indication: atrial fibrillation  Allergies  Allergen Reactions  . Digoxin And Related Other (See Comments)    Dig toxicity 2009  . Penicillins Rash    Patient Measurements: Height: '6\' 3"'  (190.5 cm) Weight: 147 lb 4.8 oz (66.815 kg) IBW/kg (Calculated) : 84.5  Vital Signs: Temp: 97.8 F (36.6 C) (06/07 0544) Temp Source: Oral (06/07 0544) BP: 102/74 mmHg (06/07 0544) Pulse Rate: 90 (06/07 0544)  Labs:  Recent Labs  10/24/14 1844 10/24/14 2117 10/25/14 0527  HGB  --  12.6*  --   HCT  --  38.3*  --   PLT  --  320  --   LABPROT  --   --  21.7*  INR 2.10  --  1.89*  CREATININE  --  1.48*  --     Estimated Creatinine Clearance: 43.9 mL/min (by C-G formula based on Cr of 1.48).   Medical History: Past Medical History  Diagnosis Date  . Hypertension   . Chronic systolic heart failure     a. NICM - EF 35% with normal cors 2003. b. Last echo 11/2012 - EF 25-30%.  . Depression   . GERD (gastroesophageal reflux disease)   . Portal vein thrombosis   . Avascular necrosis of bones of both hips     Status post bilateral hip replacements  . Gastritis   . Alcohol abuse   . Erectile dysfunction   . Bipolar disorder   . Hydrocele, unspecified   . Intermittent claudication   . Lung nodule     Chest CT scan on 12/14/2008 showed a nodular opacity at the left lung base felt to most likely represent scarring.  Follow-up chest CT scan on 06/02/2010 showed parenchymal scarring in the left apex, left  lower lobe, and lingula; some of this scarring at the left base had a nodular appearance, unchanged. Chest 09/2012 - stable.  . 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  Thyroid scan 08/28/2009 was normal with no focal areas of abnormal increased or decreased activity seen; the uptake of I 131 sodium iodide at 24 hours was 5.7%.  TSH and free T4 normalized by 08/16/2009.  .  Intestinal obstruction   . COPD (chronic obstructive pulmonary disease)   . Clostridium difficile colitis   . E coli bacteremia   . DVT (deep venous thrombosis)     He has hypercoagulability with multiple prior DVTs  . Pleural plaque     H/o asbestos exposure. Chest CT on 06/02/2010 showed stable extensive calcified pleural plaques involving the left hemithorax, consistent with asbestos related pleural disease.  . Weight loss     Normal colonoscopy by Dr. Olevia Perches on 11/06/2010.  . Tachycardia-bradycardia syndrome     a. 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 b. [employer] gen change 2010.  [name] PAF (paroxysmal atrial fibrillation)     Paroxysmal, failed medical therapy with amiodarone but has done very well with multaq;  d/c 2/2 to recurrent AFib;  Tikosyn load c/b Hyperkalemia during admx 05/2013 => rate control strategy  . 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  . Noncompliance   . Thrombocytopenia   . Osteoarthritis   . Spondylosis   . Anxiety   . CKD (chronic kidney disease)     Renal U/S 12/04/2009 showed no pathological findings. Labs 12/04/2009 include normal ESR, C3,  C4; neg ANA; SPEP showed nonspecific increase in the alpha-2 region with no M-spike; UPEP showed no monoclonal free light chains; urine IFE showed polyclonal increase in feree Kappa and/or free Lambda light chains. Baseline Cr reported 1.7-2.5.  . STEMI (ST elevation myocardial infarction) 10/11/2014  . Hyperlipemia   . CHF (congestive heart failure)   . On home oxygen therapy     "3L prn" (10/24/2014)    Medications:  Scheduled:  . aspirin EC  81 mg Oral Daily  . buPROPion  75 mg Oral BID  . digoxin  0.0625 mg Oral Daily  . digoxin  0.125 mg Oral Once  . feeding supplement (ENSURE ENLIVE)  237 mL Oral BID BM  . metoprolol tartrate  75 mg Oral BID  . multivitamin with minerals  1 tablet Oral Daily  . pantoprazole  80  mg Oral Daily  . ramipril  2.5 mg Oral Daily  . sodium chloride  3 mL Intravenous Q12H  . tiotropium  18 mcg Inhalation Daily  . warfarin  7.5 mg Oral ONCE-1800  . Warfarin - Pharmacist Dosing Inpatient   Does not apply q1800    Assessment: 69 yo male here with SOB and dizziness. He is on coumadin  PTA for afib and pharmacy has been consulted to dose. INR today= 1.89  Home coumadin dose: 7.77m daily except 560mTTSa (last dose taken 10/23/14)  Patient is not therapeutic on current home dose today. He also has been hovering around an INR of 2 per lab results recently. Patient seems to be at high risk for developing additional clots with low INR.  Goal of Therapy:  INR 2-3 Monitor platelets by anticoagulation protocol: Yes   Plan:  -Warfarin 7.5 mg x 1 tonight -Daily PT/INR -Monitor s/sx of bleeding  MiLevester FreshPharmD, BCPS Clinical Pharmacist Pager [phone]/11/2014 11:16 AM

## 2014-10-25 NOTE — Progress Notes (Signed)
OT Cancellation Note  Patient Details Name: [name] MRN: [id number] DOB: 06/20/1945   Cancelled Treatment:    Reason Eval/Treat Not Completed: Medical issues which prohibited therapy (Pt on strict bed rest ). Will follow-up as able and appropriate for OT eval.   Kaylise Blakeley , MS, OTR/L, CLT Pager: [phone]  10/25/2014, 10:56 AM

## 2014-10-25 NOTE — Discharge Instructions (Addendum)
-Take toprol xl 200 mg daily -Don't take lopressor or ramipril  -Keep taking you other medications       Information on my medicine - Coumadin   (Warfarin)  This medication education was reviewed with me or my healthcare representative as part of my discharge preparation.    Why was Coumadin prescribed for you? Coumadin was prescribed for you because you have a blood clot or a medical condition that can cause an increased risk of forming blood clots. Blood clots can cause serious health problems by blocking the flow of blood to the heart, lung, or brain. Coumadin can prevent harmful blood clots from forming. As a reminder your indication for Coumadin is:   Stroke Prevention Because Of Atrial Fibrillation  What test will check on my response to Coumadin? While on Coumadin (warfarin) you will need to have an INR test regularly to ensure that your dose is keeping you in the desired range. The INR (international normalized ratio) number is calculated from the result of the laboratory test called prothrombin time (PT).  If an INR APPOINTMENT HAS NOT ALREADY BEEN MADE FOR YOU please schedule an appointment to have this lab work done by your health care provider within 7 days. Your INR goal is usually a number between:  2 to 3 or your provider may give you a more narrow range like 2-2.5.  Ask your health care provider during an office visit what your goal INR is.  What  do you need to  know  About  COUMADIN? Take Coumadin (warfarin) exactly as prescribed by your healthcare provider about the same time each day.  DO NOT stop taking without talking to the doctor who prescribed the medication.  Stopping without other blood clot prevention medication to take the place of Coumadin may increase your risk of developing a new clot or stroke.  Get refills before you run out.  What do you do if you miss a dose? If you miss a dose, take it as soon as you remember on the same day then continue your regularly  scheduled regimen the next day.  Do not take two doses of Coumadin at the same time.  Important Safety Information A possible side effect of Coumadin (Warfarin) is an increased risk of bleeding. You should call your healthcare provider right away if you experience any of the following: ? Bleeding from an injury or your nose that does not stop. ? Unusual colored urine (red or dark brown) or unusual colored stools (red or black). ? Unusual bruising for unknown reasons. ? A serious fall or if you hit your head (even if there is no bleeding).  Some foods or medicines interact with Coumadin (warfarin) and might alter your response to warfarin. To help avoid this: ? Eat a balanced diet, maintaining a consistent amount of Vitamin K. ? Notify your provider about major diet changes you plan to make. ? Avoid alcohol or limit your intake to 1 drink for women and 2 drinks for men per day. (1 drink is 5 oz. wine, 12 oz. beer, or 1.5 oz. liquor.)  Make sure that ANY health care provider who prescribes medication for you knows that you are taking Coumadin (warfarin).  Also make sure the healthcare provider who is monitoring your Coumadin knows when you have started a new medication including herbals and non-prescription products.  Coumadin (Warfarin)  Major Drug Interactions  Increased Warfarin Effect Decreased Warfarin Effect  Alcohol (large quantities) Antibiotics (esp. Septra/Bactrim, Flagyl, Cipro) Amiodarone (Cordarone)  Aspirin (ASA) Cimetidine (Tagamet) Megestrol (Megace) NSAIDs (ibuprofen, naproxen, etc.) Piroxicam (Feldene) Propafenone (Rythmol SR) Propranolol (Inderal) Isoniazid (INH) Posaconazole (Noxafil) Barbiturates (Phenobarbital) Carbamazepine (Tegretol) Chlordiazepoxide (Librium) Cholestyramine (Questran) Griseofulvin Oral Contraceptives Rifampin Sucralfate (Carafate) Vitamin K   Coumadin (Warfarin) Major Herbal Interactions  Increased Warfarin Effect Decreased Warfarin  Effect  Garlic Ginseng Ginkgo biloba Coenzyme Q10 Green tea St. Johns wort    Coumadin (Warfarin) FOOD Interactions  Eat a consistent number of servings per week of foods HIGH in Vitamin K (1 serving =  cup)  Collards (cooked, or boiled & drained) Kale (cooked, or boiled & drained) Mustard greens (cooked, or boiled & drained) Parsley *serving size only =  cup Spinach (cooked, or boiled & drained) Swiss chard (cooked, or boiled & drained) Turnip greens (cooked, or boiled & drained)  Eat a consistent number of servings per week of foods MEDIUM-HIGH in Vitamin K (1 serving = 1 cup)  Asparagus (cooked, or boiled & drained) Broccoli (cooked, boiled & drained, or raw & chopped) Brussel sprouts (cooked, or boiled & drained) *serving size only =  cup Lettuce, raw (green leaf, endive, romaine) Spinach, raw Turnip greens, raw & chopped   These websites have more information on Coumadin (warfarin):  FailFactory.se; VeganReport.com.au;

## 2014-10-25 NOTE — Progress Notes (Signed)
Subjective:    No acute events overnight. Patient denies any chest pain, dizziness, or shortness of breath while lying in bed but feels like he could not stand up due to dizziness and weakness. He is intermittently tachycardic up to 129. Tolerating a regular diet but still complains of decreased appetite. He is concerned about his pain medication (on Percocet 10-325 mg q4hrs PRN pain at home). Cardiology and PT to evaluate him today.    Objective:    Vital Signs:   Temp:  [97.7 F (36.5 C)-98.2 F (36.8 C)] 97.8 F (36.6 C) (06/07 0544) Pulse Rate:  [70-129] 90 (06/07 0544) Resp:  [17-21] 17 (06/07 0544) BP: (102-152)/(74-101) 102/74 mmHg (06/07 0544) SpO2:  [96 %-100 %] 96 % (06/07 0939) Weight:  [66.815 kg (147 lb 4.8 oz)-66.906 kg (147 lb 8 oz)] 66.815 kg (147 lb 4.8 oz) (06/06 1827) Last BM Date: 10/24/14   Intake/Output:   Intake/Output Summary (Last 24 hours) at 10/25/14 1319 Last data filed at 10/25/14 0937  Gross per 24 hour  Intake    460 ml  Output    400 ml  Net     60 ml      Physical Exam General: Thin man lying in bed in no acute distress, alert and interactive CV: Irregularly irregular rhythm. No murmurs/rubs/gallops appreciated. 2+ radial pulses bilaterally Lungs: CTAB, normal work of breathing on room air, no wheezes or crackles Abdomen: Soft, non-tender, non-distended, active bowel sounds Extremities: No clubbing, cyanosis or edema.  Skin: Warm and dry, no rashes or lesions Neuro: Alert and oriented x3, fluent speech, no gross focal deficits   Labs:  Basic Metabolic Panel:  Recent Labs Lab 10/24/14 2117  NA 143  K 4.6  CL 110  CO2 24  GLUCOSE 86  BUN 34*  CREATININE 1.48*  CALCIUM 8.9  MG 2.0    Liver Function Tests:  Recent Labs Lab 10/24/14 2117  AST 74*  ALT 68*  ALKPHOS 56  BILITOT 0.7  PROT 6.4*  ALBUMIN 3.0*    CBC:  Recent Labs Lab 10/19/14 0610 10/24/14 2117  WBC 8.8 9.1  NEUTROABS  --  5.0  HGB 12.7* 12.6*   HCT 38.4* 38.3*  MCV 90.6 91.0  PLT 205 320   CBG:  Recent Labs Lab 10/24/14 2157  GLUCAP 85    Microbiology: None new  Coagulation Studies:  Recent Labs  10/24/14 1844 10/25/14 0527  LABPROT  --  21.7*  INR 2.10 1.89*     Other results: EKG: Atrial flutter, irregular rhythm, QTc 467. No other changes from baseline ECG  Imaging: X-ray Chest Pa And Lateral  10/24/2014   CLINICAL DATA:  Dyspnea  EXAM: CHEST  2 VIEW  COMPARISON:  None.  FINDINGS: Lungs are hyperinflated. There is left basilar pleural plaque. There is left basilar airspace disease unchanged compared with the prior exams likely reflecting scarring. There is no pneumothorax. There is a nodular opacity in the right lower lung which reflect superimposition of the inferior aspect of the scapula overlying a posterior rib. There is stable cardiomegaly. There is thoracic aortic atherosclerosis. There is a dual lead AICD. There is no acute osseous abnormality.  IMPRESSION: No active cardiopulmonary disease.  Chronic changes at the left lung base.   Electronically Signed   By: Kathreen Devoid   On: 10/24/2014 21:48       Medications:     Scheduled Medications: . aspirin EC  81 mg Oral Daily  . buPROPion  75  mg Oral BID  . digoxin  0.0625 mg Oral Daily  . digoxin  0.125 mg Oral Once  . feeding supplement (ENSURE ENLIVE)  237 mL Oral BID BM  . metoprolol tartrate  75 mg Oral BID  . multivitamin with minerals  1 tablet Oral Daily  . pantoprazole  80 mg Oral Daily  . ramipril  2.5 mg Oral Daily  . sodium chloride  3 mL Intravenous Q12H  . tiotropium  18 mcg Inhalation Daily  . warfarin  7.5 mg Oral ONCE-1800  . Warfarin - Pharmacist Dosing Inpatient   Does not apply q1800    PRN Medications: albuterol, ondansetron, oxyCODONE-acetaminophen **AND** oxyCODONE   Assessment/ Plan:    Active Problems:   Essential hypertension   BRADYCARDIA-TACHYCARDIA SYNDROME   COPD (chronic obstructive pulmonary disease)    Chronic kidney disease   Chronic combined systolic and diastolic congestive heart failure   Weight loss   Lightheadedness   Non-ischemic cardiomyopathy   Severe malnutrition   Atrial fibrillation with RVR   Dyspnea  69 year old man with PMHx most significant for CHF, chronic AFib with RVR, tachy-brady syndrome w/ ICD, recent admission 5/24-6/1 for Afib with complications of STEMI, TIA, and acute lower extremity ischemia, and COPD who presents with dyspnea on exertion and dizziness on standing, which are chronic issues.   Dyspnea on exertion-- Most likely due to combination of deconditioning, Afib with RVR (HR up to 129), and chronic COPD. Patient has CHF but no signs/symptoms of fluid overload. Pt at risk for PE given history of embolic complications and slightly subtherapeutic on warfarin (INR 1.89 this AM); however, DOE is relatively chronic and does not appear worse from baseline. CXR clear and no hypoxia on room air.  - PT/OT consulted, may need SNF given deconditioning - AFib with RVR management as below - Continue home albuterol and Spiriva inhalers  Light-headedness-- Patient complains of light-headedness when standing, most consistent with orthostatic hypotension in setting of decreased PO intake. However, no orthostasis in clinic yesterday (appropriate increase in BP, but HR dropped from 129 lying to 78 standing reflecting possible autonomic dysfunction). Afib with RVR also likely contributing to light-headedness.  - Patient refused repeat orthostatic vitals this AM.  - AFib with RVR management as below - PT/OT consult - Encourage PO intake, continue home Ensure - SW consult as below given barriers to food access  Atrial fibrillation w/RVR-- Chronic and difficult to control, as patient has failed medical therapy with amiodarone (due to thyroiditis) and Tikosyn. Cardiology evaluated patient during last admission, and rate-control strategy was chosen as patient is not a candidate for  antiarrhythmics therapy. Restarted on digoxin during last admission, digoxin level now low at 0.3. CHA2DS2-VASc Score of 6 is equal to 9.7 % stroke rate/year.  - Cardiology consulted, appreciate recommendations - Extra digoxin dose today (0.125 mg x1 dose) given low digoxin level, then continue digoxin 0.0625 mg daily - Decrease ramipril from 2.5 mg BID to 2.5 mg daily to allow for higher dose of beta blocker if needed to control HR, given recent tachycardia - Continue metoprolol 75 mg BID - Continue Coumadin per pharmacy- home dose 7.5mg  daily except 5mg  TTSa. INR slightly subtherapeutic at 1.89 on 6/7 - Daily INR  Failure to thrive-- BMI 18.4 with about 30 pounds weight loss in past year. Etiology most likely multifactorial, as patient reports decreased appetite, difficulty accessing food due to immobility, and difficulty chewing (lost teeth, no dentures). COPD and depression may be contributing as well. Previous  workup for FTT has been negative (nl colonoscopy 2012, CT chest/abd/pelvis w/ contrast 10/13/14 w/ no evidence malignancy). - Continue home Ensure - Nutrition consult - Social work consult given barriers to food access  Elevated liver enzymes: AST 74, ALT 68 ( AST 72 and ALT WNL on 5/23, likely due to STEMI). No history of hepatitis, no GI symptoms or abdominal pain. Possibly secondary to starting Crestor last month.   - Restart Crestor, no need to stop in setting of asymptomatic AST/ALT elevations  HTN - Decrease ramipril to 2.5 mg daily as above - Continue home metoprolol  Chronic systolic CHF w/ ICD-- ECHO last month revealed EF of 30-35% w/ diffuse hypokinesis in setting of STEMI.  - needs repeat ECHO in 2 months per cardiology - continue home metoprolol and ramipril as above  CKD 3-- Admission Cr 1.48, near apparent baseline of 1.5-1.6 - no   COPD - cont home Spiriva and PRN albuterol  HLD--  - Continue rosuvastatin 20mg  daily  BPH -- Continue home flomax 0.4mg   qd  Depression - Continue home wellbutrin 75mg  BID  Chronic hip pain-- on Percocet at home for chronic pain. - Continue home dose percocet 10-325 mg q4H prn pain (formulary equivalent: 5 mg oxycodone plus Percocet 5-325 mg q4H PRN pain)  GERD-- - on home prilosec 40mg  at home, substitute Protonix 80mg  daily while admitted.   DVT ppx--coumadin per pharmacy. INR 1.89, slightly subtherapeutic.   FEN - Regular diet, plus Ensure - Consider nutrition consult  Code: FULL  Dispo: Disposition is deferred at this time, awaiting improvement of current medical problems.   The patient does have a current PCP Bertha Stakes, MD) and does need an [facility] hospital follow-up appointment after discharge.  The patient does not have transportation limitations that hinder transportation to clinic appointments.   SERVICE NEEDED AT [facility]         Y = Yes, Blank = No PT:   OT:   RN:   Equipment:   Other:      Length of Stay: 1 day(s)   Signed: Betsey Amen, MS4

## 2014-10-25 NOTE — Progress Notes (Signed)
PT Cancellation Note  Patient Details Name: [name] MRN: [id number] DOB: 06-May-1946   Cancelled Treatment:    Reason Eval/Treat Not Completed: Other (comment) (dizziness, drowsiness after pain meds). Pt known to me from recent admission. Reports things didn't go well at home and he had dizziness and weakness. Spoke to pt about ST-SNF and he is agreeable. Will have PT who can assess if vestibular issues are possible cause of dizziness see pt tomorrow.    Raiyan Dalesandro 10/25/2014, 2:39 PM  [facility] PT [phone]

## 2014-10-25 NOTE — Progress Notes (Addendum)
Subjective:  Pt seen and examined in AM. No acute events overnight. He continues to have lightheadedness with standing but denies dyspnea, palpitations, or chest pain.      Objective: Vital signs in last 24 hours: Filed Vitals:   10/24/14 2205 10/24/14 2317 10/25/14 0544 10/25/14 0939  BP: 121/92  102/74   Pulse: 114  90   Temp: 97.7 F (36.5 C) 98.2 F (36.8 C) 97.8 F (36.6 C)   TempSrc: Oral Oral Oral   Resp: 21  17   Height:      Weight:      SpO2: 98% 100% 99% 96%   Weight change:   Intake/Output Summary (Last 24 hours) at 10/25/14 1253 Last data filed at 10/25/14 0937  Gross per 24 hour  Intake    460 ml  Output    400 ml  Net     60 ml   PHYSICAL EXAMINATION: General: Thin appearing, NAD Heart: Tachycardic, irregularly irregular rhythm  Lungs: Increased BS, no ronchi, wheezing, or rales Abdomen: Soft, non-tender, non-distended, normal BS Extremities: No edema, left groin with CDI dressing/bandage   Lab Results: Basic Metabolic Panel:  Recent Labs Lab 10/24/14 2117  NA 143  K 4.6  CL 110  CO2 24  GLUCOSE 86  BUN 34*  CREATININE 1.48*  CALCIUM 8.9  MG 2.0   Liver Function Tests:  Recent Labs Lab 10/24/14 2117  AST 74*  ALT 68*  ALKPHOS 56  BILITOT 0.7  PROT 6.4*  ALBUMIN 3.0*   No results for input(s): LIPASE, AMYLASE in the last 168 hours. No results for input(s): AMMONIA in the last 168 hours. CBC:  Recent Labs Lab 10/19/14 0610 10/24/14 2117  WBC 8.8 9.1  NEUTROABS  --  5.0  HGB 12.7* 12.6*  HCT 38.4* 38.3*  MCV 90.6 91.0  PLT 205 320   CBG:  Recent Labs Lab 10/24/14 2157  GLUCAP 85  Coagulation:  Recent Labs Lab 10/19/14 0610 10/24/14 1844 10/25/14 0527  LABPROT 21.8*  --  21.7*  INR 1.91* 2.10 1.89*   Urine Drug Screen: Drugs of Abuse     Component Value Date/Time   LABOPIA NONE DETECTED 10/11/2014 1724   LABOPIA NEGATIVE 12/27/2008 1943   COCAINSCRNUR NONE DETECTED 10/11/2014 1724   COCAINSCRNUR  NEG 07/06/2009 2225   LABBENZ NONE DETECTED 10/11/2014 1724   LABBENZ NEG 07/06/2009 2225   AMPHETMU NONE DETECTED 10/11/2014 1724   AMPHETMU NEG 07/06/2009 2225   THCU POSITIVE* 10/11/2014 1724   LABBARB NONE DETECTED 10/11/2014 1724    Alcohol Level: No results for input(s): ETH in the last 168 hours. Urinalysis: No results for input(s): COLORURINE, LABSPEC, PHURINE, GLUCOSEU, HGBUR, BILIRUBINUR, KETONESUR, PROTEINUR, UROBILINOGEN, NITRITE, LEUKOCYTESUR in the last 168 hours.  Invalid input(s): APPERANCEUR Studies/Results: X-ray Chest Pa And Lateral  10/24/2014   CLINICAL DATA:  Dyspnea  EXAM: CHEST  2 VIEW  COMPARISON:  None.  FINDINGS: Lungs are hyperinflated. There is left basilar pleural plaque. There is left basilar airspace disease unchanged compared with the prior exams likely reflecting scarring. There is no pneumothorax. There is a nodular opacity in the right lower lung which reflect superimposition of the inferior aspect of the scapula overlying a posterior rib. There is stable cardiomegaly. There is thoracic aortic atherosclerosis. There is a dual lead AICD. There is no acute osseous abnormality.  IMPRESSION: No active cardiopulmonary disease.  Chronic changes at the left lung base.   Electronically Signed   By: Kathreen Devoid  On: 10/24/2014 21:48   Medications: I have reviewed the patient's current medications. Scheduled Meds: . aspirin EC  81 mg Oral Daily  . buPROPion  75 mg Oral BID  . digoxin  0.0625 mg Oral Daily  . digoxin  0.125 mg Oral Once  . feeding supplement (ENSURE ENLIVE)  237 mL Oral BID BM  . metoprolol tartrate  75 mg Oral BID  . multivitamin with minerals  1 tablet Oral Daily  . pantoprazole  80 mg Oral Daily  . ramipril  2.5 mg Oral Daily  . sodium chloride  3 mL Intravenous Q12H  . tiotropium  18 mcg Inhalation Daily  . warfarin  7.5 mg Oral ONCE-1800  . Warfarin - Pharmacist Dosing Inpatient   Does not apply q1800   Continuous Infusions:  PRN  Meds:.albuterol Assessment/Plan:   Symptomatic persistent Afib with RVR complicated by thromboembolism - Current HR 103 with irregularly irregular rhythm. INR slightly sub-therapeutic at 1.89. Pt with CHADS2 score of 4 requiring life-long AC therapy.  -Appreciate cardiology recommendations  -Monitor on telemetry  -Continue coumadin per pharmacy with goal INR 2-3 -Increase digoxin 0.0625 mg daily today for rate control and observe, dig level low at 0.3, however has had toxicity in the past -Repeat digoxin level  -Continue metoprolol 75 mg TID for rate control, consider transition to toprol-xL -Avoid CCB in setting of reduced EF  -Per Dr. Rayann Heman, pt not candidate for AVN ablation  -Will need outpatient NM stress testing and repeat 2D-echo in 3 months -Continue aspirin 81 mg daily in setting of recent STEMI  -PT and OT consults  Hypertension - Currently normotensive  -Continue metoprolol 75 mg TID -Decrease ramipril 2.5 mg BID to daily   Chronic Systolic CHF - Currently euvolemic.  -Continue metoprolol 75 mg TID  -Decrease ramipril 2.5 mg BID to daily  -Will need repeat 2D-echo in 3 months  Severe Malnutrition -BMI 19 with 24 lb weight loss over last 5 months.  -Appreciate RD recommendations -Encourage dietary intake  -Continue ensure 237 mL TID between meals   Elevated AST/ALT - AST 74 and ALT 68 most likely in setting of ischemia vs recently started statin.  -Follow-up acute hepatitis panel  -Monitor CMP   CKD Stage 3 - Cr 1.48 below baseline Cr of 1.6 with normal urine output. -Monitor daily weights and strict I & O's -Avoid nephrotoxins   COPD - Currently with no exacerbation.  -Continue spiriva and albuterol nebulizer Q 6 hr PRN   Hyperlipidemia - Last lipid panel on 10/12/14 was normal.  -Obtain CK level  -Resume rosuvastatin 20 mg daily   BPH - Currently stable. -Hold home flomax 0.4 mg daily  -Monitor for orthostatic hypotension   Depression -  Currently with stable mood -Continue wellbutrin 75 mg BID   GERD - Currently without acid reflux symptoms.  -Continue protonix 40 mg daily    Diet: Regular DVT Ppx: Coumadin  Code: Full     Dispo: Disposition is deferred at this time, awaiting improvement of current medical problems.  Anticipated discharge in approximately 2-5 day(s).   The patient does have a current PCP Bertha Stakes, MD) and does need an [facility] hospital follow-up appointment after discharge.  The patient does have transportation limitations that hinder transportation to clinic appointments.  .Services Needed at time of discharge: Y = Yes, Blank = No PT:   OT:   RN:   Equipment:   Other:     LOS: 1 day   Juluis Mire, MD  10/25/2014, 12:53 PM

## 2014-10-26 ENCOUNTER — Telehealth: Payer: Self-pay | Admitting: Licensed Clinical Social Worker

## 2014-10-26 ENCOUNTER — Telehealth: Payer: Self-pay | Admitting: Vascular Surgery

## 2014-10-26 DIAGNOSIS — I5022 Chronic systolic (congestive) heart failure: Secondary | ICD-10-CM | POA: Diagnosis not present

## 2014-10-26 DIAGNOSIS — I5042 Chronic combined systolic (congestive) and diastolic (congestive) heart failure: Secondary | ICD-10-CM

## 2014-10-26 DIAGNOSIS — I4891 Unspecified atrial fibrillation: Secondary | ICD-10-CM

## 2014-10-26 DIAGNOSIS — I130 Hypertensive heart and chronic kidney disease with heart failure and stage 1 through stage 4 chronic kidney disease, or unspecified chronic kidney disease: Secondary | ICD-10-CM | POA: Diagnosis not present

## 2014-10-26 DIAGNOSIS — I10 Essential (primary) hypertension: Secondary | ICD-10-CM | POA: Diagnosis not present

## 2014-10-26 DIAGNOSIS — R0600 Dyspnea, unspecified: Secondary | ICD-10-CM | POA: Diagnosis not present

## 2014-10-26 DIAGNOSIS — I481 Persistent atrial fibrillation: Secondary | ICD-10-CM | POA: Diagnosis not present

## 2014-10-26 DIAGNOSIS — E43 Unspecified severe protein-calorie malnutrition: Secondary | ICD-10-CM | POA: Diagnosis not present

## 2014-10-26 DIAGNOSIS — I129 Hypertensive chronic kidney disease with stage 1 through stage 4 chronic kidney disease, or unspecified chronic kidney disease: Secondary | ICD-10-CM | POA: Diagnosis not present

## 2014-10-26 DIAGNOSIS — K219 Gastro-esophageal reflux disease without esophagitis: Secondary | ICD-10-CM | POA: Diagnosis not present

## 2014-10-26 DIAGNOSIS — J449 Chronic obstructive pulmonary disease, unspecified: Secondary | ICD-10-CM | POA: Diagnosis not present

## 2014-10-26 DIAGNOSIS — Z7901 Long term (current) use of anticoagulants: Secondary | ICD-10-CM | POA: Diagnosis not present

## 2014-10-26 DIAGNOSIS — N183 Chronic kidney disease, stage 3 (moderate): Secondary | ICD-10-CM | POA: Diagnosis not present

## 2014-10-26 DIAGNOSIS — I429 Cardiomyopathy, unspecified: Secondary | ICD-10-CM | POA: Diagnosis not present

## 2014-10-26 DIAGNOSIS — F329 Major depressive disorder, single episode, unspecified: Secondary | ICD-10-CM | POA: Diagnosis not present

## 2014-10-26 DIAGNOSIS — E785 Hyperlipidemia, unspecified: Secondary | ICD-10-CM | POA: Diagnosis not present

## 2014-10-26 LAB — COMPREHENSIVE METABOLIC PANEL
ALBUMIN: 2.7 g/dL — AB (ref 3.5–5.0)
ALT: 48 U/L (ref 17–63)
AST: 37 U/L (ref 15–41)
Alkaline Phosphatase: 49 U/L (ref 38–126)
Anion gap: 8 (ref 5–15)
BUN: 28 mg/dL — AB (ref 6–20)
CALCIUM: 8.5 mg/dL — AB (ref 8.9–10.3)
CO2: 24 mmol/L (ref 22–32)
Chloride: 105 mmol/L (ref 101–111)
Creatinine, Ser: 1.28 mg/dL — ABNORMAL HIGH (ref 0.61–1.24)
GFR calc non Af Amer: 55 mL/min — ABNORMAL LOW (ref >60)
Glucose, Bld: 72 mg/dL (ref 65–99)
Potassium: 4.4 mmol/L (ref 3.5–5.1)
Sodium: 137 mmol/L (ref 135–145)
TOTAL PROTEIN: 5.7 g/dL — AB (ref 6.5–8.1)
Total Bilirubin: 0.7 mg/dL (ref 0.3–1.2)

## 2014-10-26 LAB — HEPATITIS PANEL, ACUTE
HCV Ab: 0.1 s/co ratio (ref 0.0–0.9)
HEP A IGM: NEGATIVE
Hep B C IgM: NEGATIVE
Hepatitis B Surface Ag: NEGATIVE

## 2014-10-26 LAB — CBC
HEMATOCRIT: 37.1 % — AB (ref 39.0–52.0)
Hemoglobin: 12.1 g/dL — ABNORMAL LOW (ref 13.0–17.0)
MCH: 29.7 pg (ref 26.0–34.0)
MCHC: 32.6 g/dL (ref 30.0–36.0)
MCV: 90.9 fL (ref 78.0–100.0)
Platelets: 311 10*3/uL (ref 150–400)
RBC: 4.08 MIL/uL — ABNORMAL LOW (ref 4.22–5.81)
RDW: 14.3 % (ref 11.5–15.5)
WBC: 7.1 10*3/uL (ref 4.0–10.5)

## 2014-10-26 LAB — DIGOXIN LEVEL: Digoxin Level: 0.4 ng/mL — ABNORMAL LOW (ref 0.8–2.0)

## 2014-10-26 LAB — PROTIME-INR
INR: 2.06 — ABNORMAL HIGH (ref 0.00–1.49)
Prothrombin Time: 23.1 seconds — ABNORMAL HIGH (ref 11.6–15.2)

## 2014-10-26 LAB — CK: CK TOTAL: 30 U/L — AB (ref 49–397)

## 2014-10-26 MED ORDER — WARFARIN SODIUM 7.5 MG PO TABS
7.5000 mg | ORAL_TABLET | Freq: Once | ORAL | Status: AC
  Administered 2014-10-26: 7.5 mg via ORAL
  Filled 2014-10-26: qty 1

## 2014-10-26 NOTE — Progress Notes (Signed)
Patient Profile: 69 year old male with chronic atrial fibrillation readmitted with rapid ventricular response, weakness and dizziness.  Subjective: Complains of mild dizziness. No chest pain, dyspnea or syncope.   Objective: Vital signs in last 24 hours: Temp:  [97.9 F (36.6 C)-98.8 F (37.1 C)] 98.1 F (36.7 C) (06/08 0712) Pulse Rate:  [70-110] 70 (06/08 1000) Resp:  [16-20] 20 (06/08 0712) BP: (101-125)/(78-89) 103/86 mmHg (06/08 0712) SpO2:  [97 %-100 %] 97 % (06/08 0801) Last BM Date: 10/24/14  Intake/Output from previous day: 06/07 0701 - 06/08 0700 In: 750 [P.O.:750] Out: 1045 [Urine:1045] Intake/Output this shift: Total I/O In: 240 [P.O.:240] Out: 275 [Urine:275]  Medications Current Facility-Administered Medications  Medication Dose Route Frequency Provider Last Rate Last Dose  . albuterol (PROVENTIL) (2.5 MG/3ML) 0.083% nebulizer solution 3 mL  3 mL Inhalation Q6H PRN Ejiroghene Arlyce Dice, MD      . aspirin EC tablet 81 mg  81 mg Oral Daily Ejiroghene E Emokpae, MD   81 mg at 10/26/14 1000  . buPROPion [facility]) tablet 75 mg  75 mg Oral BID Ejiroghene Arlyce Dice, MD   75 mg at 10/26/14 1001  . digoxin (LANOXIN) tablet 0.0625 mg  0.0625 mg Oral Daily Norman Herrlich, MD   0.0625 mg at 10/26/14 1000  . feeding supplement (ENSURE ENLIVE) (ENSURE ENLIVE) liquid 237 mL  237 mL Oral BID BM Ejiroghene E Emokpae, MD   237 mL at 10/26/14 1000  . metoprolol tartrate (LOPRESSOR) tablet 75 mg  75 mg Oral TID Juluis Mire, MD   75 mg at 10/26/14 1000  . multivitamin with minerals tablet 1 tablet  1 tablet Oral Daily Ejiroghene Arlyce Dice, MD   1 tablet at 10/26/14 1000  . ondansetron (ZOFRAN) tablet 4 mg  4 mg Oral Q8H PRN Juluis Mire, MD      . oxyCODONE-acetaminophen (PERCOCET/ROXICET) 5-325 MG per tablet 1 tablet  1 tablet Oral Q4H PRN Juluis Mire, MD   1 tablet at 10/26/14 1000   And  . oxyCODONE (Oxy IR/ROXICODONE) immediate release tablet 5 mg  5 mg Oral Q4H  PRN Marjan Rabbani, MD   5 mg at 10/26/14 1000  . pantoprazole (PROTONIX) EC tablet 80 mg  80 mg Oral Daily Ejiroghene E Emokpae, MD   80 mg at 10/26/14 1002  . ramipril (ALTACE) capsule 2.5 mg  2.5 mg Oral Daily Darlin Coco, MD   2.5 mg at 10/26/14 1000  . rosuvastatin (CRESTOR) tablet 20 mg  20 mg Oral q1800 Marjan Rabbani, MD      . sodium chloride 0.9 % injection 3 mL  3 mL Intravenous Q12H Ejiroghene E Emokpae, MD   3 mL at 10/26/14 1002  . tiotropium (SPIRIVA) inhalation capsule 18 mcg  18 mcg Inhalation Daily Ejiroghene Arlyce Dice, MD   18 mcg at 10/26/14 0800  . warfarin (COUMADIN) tablet 7.5 mg  7.5 mg Oral ONCE-1800 Aldine Contes, MD      . Warfarin - Pharmacist Dosing Inpatient   Does not apply [place]        PE: General appearance: alert, cooperative and no distress Neck: no carotid bruit and no JVD Lungs: clear to auscultation bilaterally Heart: irregularly irregular rhythm, tachy rate Extremities: no LEE Pulses: 2+ and symmetric Skin: warm and dry Neurologic: Grossly normal  Lab Results:   Recent Labs  10/24/14 2117 10/26/14 0700  WBC 9.1 7.1  HGB 12.6* 12.1*  HCT 38.3* 37.1*  PLT 320 311  BMET  Recent Labs  10/24/14 2117 10/26/14 0700  NA 143 137  K 4.6 4.4  CL 110 105  CO2 24 24  GLUCOSE 86 72  BUN 34* 28*  CREATININE 1.48* 1.28*  CALCIUM 8.9 8.5*   PT/INR  Recent Labs  10/24/14 1844 10/25/14 0527 10/26/14 0700  LABPROT  --  21.7* 23.1*  INR 2.10 1.89* 2.06*    Assessment/Plan  Active Problems:   Essential hypertension   BRADYCARDIA-TACHYCARDIA SYNDROME   COPD (chronic obstructive pulmonary disease)   Chronic kidney disease   Chronic combined systolic and diastolic congestive heart failure   Weight loss   Lightheadedness   Non-ischemic cardiomyopathy   Severe malnutrition   Atrial fibrillation with RVR   Dyspnea   Protein-calorie malnutrition, severe    1. Atrial Fibrillation w/ RVR: atrial  fibrillation is chronic. Rate is elevated around low 100s-110s. He complains of dizziness. No other symptoms. He just received a dose of metoprolol < 20 minutes ago. This should help lower rate. Continue to monitor on telemetry. Continue metoprolol, 75 mg TID, along with digoxin. He is not a candidate for Cardizem given his low LV systolic function (EF 67%). Continue warfarin for stroke prophylaxis. INR is therapeutic at 2.06.     LOS: 2 days    Brittainy M. Ladoris Gene 10/26/2014 10:39 AM  Personally seen and examined. Agree with above. AFIb - reasonable rate control No changes.  EF 30% Warfarin Irreg irreg exam  Candee Furbish, MD

## 2014-10-26 NOTE — Telephone Encounter (Signed)
CSW encouraged [name] to participate with physical therapy and occupational therapy if medically appropriate in order for therapy recommendations to assist with the discharge planning process.

## 2014-10-26 NOTE — Evaluation (Signed)
Physical Therapy Evaluation Patient Details Name: [name] MRN: [id number] DOB: Nov 02, 1945 Today's Date: 10/26/2014   History of Present Illness  69 y/o male with PMH of HTN, depression, portal vein thrombosis, non ischemic CM, tachy brady syndrome s//p ICD, recurrent DVT, COPD, afib who p/w DOE and lightheadedness.  Clinical Impression  Pt admitted with above complications. Pt currently with functional limitations due to the deficits listed below (see PT Problem List). Upon assessment, dizziness does not appear consistent with vestibular type dysfunction. Pt reports he is most dizzy upon standing (see corresponding vitals below). States he has become physically weak since recent d/c from hospital and does not feel he can safely care for himself at home. Ambulates with min guard assist up to 60 feet today, very guarded, HR in 130s. Moderate dyspnea, SpO2 98% on room air. Lacks appropriate supervision at home. Would benefit from ST-SNF prior to returning home to improve his functional independence.  Pt will benefit from skilled PT to increase their independence and safety with mobility to allow discharge to the venue listed below.       Follow Up Recommendations SNF    Equipment Recommendations  None recommended by PT    Recommendations for Other Services       Precautions / Restrictions Precautions Precautions: Fall Restrictions Weight Bearing Restrictions: No      Mobility  Bed Mobility Overal bed mobility: Modified Independent             General bed mobility comments: extra time  Transfers Overall transfer level: Needs assistance Equipment used: Rolling walker (2 wheeled) Transfers: Sit to/from Stand Sit to Stand: Min guard         General transfer comment: min guard for safety. Vc for hand placement. Mild sway noted upon standing, using RW heavily for support. Reported dizziness.  Ambulation/Gait Ambulation/Gait assistance: Min guard Ambulation Distance  (Feet): 60 Feet Assistive device: Rolling walker (2 wheeled) Gait Pattern/deviations: Step-through pattern;Decreased stride length;Narrow base of support Gait velocity: decr   General Gait Details: Close guard for safety. Relies heavily on RW, very guarded, taking small steps. No overt loss of balance while using RW for support. Cues for forward gaze and wide base of support.  Stairs            Wheelchair Mobility    Modified Rankin (Stroke Patients Only)       Balance Overall balance assessment: Needs assistance Sitting-balance support: No upper extremity supported;Feet supported Sitting balance-Leahy Scale: Good     Standing balance support: Single extremity supported Standing balance-Leahy Scale: Poor                               Pertinent Vitals/Pain Pain Assessment: No/denies pain Pain Intervention(s): Monitored during session   Orthostatic Vitals:  Supine BP 115/87 - HR 114 Sitting BP 124/84 - HR 131 Standing BP 108/83 - HR 121 Standing @ 3 min BP 120/101 - HR 134    Home Living Family/patient expects to be discharged to:: Unsure Living Arrangements: Alone (son stays with patient at times) Available Help at Discharge: Family Type of Home: House Home Access: Stairs to enter Entrance Stairs-Rails: None Entrance Stairs-Number of Steps: 1 Home Layout: One level Home Equipment: Cane - quad;Walker - 2 wheels;Other (comment) (3 in 1)      Prior Function Level of Independence: Independent with assistive device(s)         Comments: walker for mobility  Hand Dominance   Dominant Hand: Right    Extremity/Trunk Assessment   Upper Extremity Assessment: Defer to OT evaluation           Lower Extremity Assessment: Generalized weakness         Communication   Communication: No difficulties  Cognition Arousal/Alertness: Awake/alert Behavior During Therapy: WFL for tasks assessed/performed Overall Cognitive Status: Within  Functional Limits for tasks assessed                      General Comments General comments (skin integrity, edema, etc.): Assessed for vestibular dysfunction including dix-hall pike and horizontal roll testing. Pt complaining of dizziness with each head position tested however no nystagmus noted. Not consistent with canalithiasis type symptoms. States he feels dizzy when he stands up. BP from 124/84 sitting to 108/83 sitting SpO2 98% on room air.    Exercises        Assessment/Plan    PT Assessment Patient needs continued PT services  PT Diagnosis Difficulty walking;Abnormality of gait;Generalized weakness   PT Problem List Decreased strength;Decreased activity tolerance;Decreased balance;Decreased mobility;Cardiopulmonary status limiting activity  PT Treatment Interventions DME instruction;Gait training;Functional mobility training;Therapeutic activities;Therapeutic exercise;Balance training;Patient/family education   PT Goals (Current goals can be found in the Care Plan section) Acute Rehab PT Goals Patient Stated Goal: Go to rehab so I can take care of myself again PT Goal Formulation: With patient Time For Goal Achievement: 11/09/14 Potential to Achieve Goals: Good    Frequency Min 3X/week   Barriers to discharge Decreased caregiver support lives alone- son stays with pt occasionally    Co-evaluation               End of Session   Activity Tolerance: Patient tolerated treatment well Patient left: in bed;with bed alarm set;with call bell/phone within reach Nurse Communication: Mobility status         Time: 3244-0102 PT Time Calculation (min) (ACUTE ONLY): 27 min   Charges:   PT Evaluation $Initial PT Evaluation Tier I: 1 Procedure PT Treatments $Gait Training: 8-22 mins   PT G CodesEllouise Newer 10/26/2014, 6:15 PM Camille Bal [place], [place]

## 2014-10-26 NOTE — Progress Notes (Signed)
Patient ID: [name], male   DOB: 1946-04-16, 69 y.o.   MRN: [id number] Patient is known to me from a recent left femoral embolectomy with presumed cardiac source approximate 2 weeks ago while an inpatient. Was to see me in the office for follow-up yesterday. Was readmitted with medical. He reports that he is weak and does not feel well this morning. No complaints regarding his left foot. Does have some mild persistent left groin and thigh soreness  Left groin incisions healing without evidence of infection or false aneurysm. A 2-3+ left popliteal pulse and well-perfused foot.  No active vascular issues. We will not follow as inpatient. Does not need outpatient unless he has new vascular issues.

## 2014-10-26 NOTE — Progress Notes (Addendum)
PT Cancellation Note  Patient Details Name: [name] MRN: [id number] DOB: 25-Jan-1946   Cancelled Treatment:    Reason Eval/Treat Not Completed: Patient declined, no reason specified Pt refused therapy services again today. States he is too tired to work with therapy. Explained importance of participating, especially for PT to assess dizziness complaints. States "come back tomorrow."  Will follow up as time allows.  [name] 10/26/2014, 9:53 AM Elayne Snare, [place]

## 2014-10-26 NOTE — Progress Notes (Signed)
Teaching service pager paged at [phone] to inform the MD per social worker Marshell Levan the patient needs a document for the patient's rehab facility signed as soon as possible, the document is on the front of the patient's chart.

## 2014-10-26 NOTE — Telephone Encounter (Signed)
THIS IS AN OUTPATIENT SOCIAL WORK NOTE, MR. Shamblin CALLED THIS WORKER DURING HIS HOSPITALIZATION:    Mr. Szymborski placed call to [place] stating "they told me to call you and see what services I can get at home."  This worker explained to Mr. Rudden that he is currently in the hospital and should forward his current discharge needs to his attending physician.  However,  CSW answered pt's questions regarding medicare benefits:  [employer] insurance pt would be able to receive home health services.  Mr. Winkowski confirms he is already linked with [facility].  Pt states he is unable to perform his ADL's, majority were passive ADL's (cleaning, cooking). CSW informed Mr. Cortese currently Medicare does not cover the services mentioned above, and pt confirmed he does not have medicaid.  Pt notified of the option to pay privately for [place], pt declined having available resources.  Mr. Hartje inquired about Medicaid and this worker informed pt Medicaid is insurance for low-income individuals and he would need to contact DSS to obtain income guidelines.  CSW encouraged Mr. Pattillo to discuss discharge needs with attending physician if he is unable to manage his ADL's.  Pt informed this worker "they talked about going to [facility], I've been there before.".  CSW reassured Mr. Voland, SNF placement is most likely his safest discharge option if he is unable to manage his ADL's mentioned above.  This worker provided emotional support and encouragement only, as patient is currently hospitalized.

## 2014-10-26 NOTE — Progress Notes (Signed)
Patient seen and examined. Case d/w residents in detail. I agree with findings and plan as documented in Dr. Gwenlyn Perking note.  Patient still c/o dizziness and has not partoicipated with PT. Informed patient that he needs to work with PT in order to get SNF placement. He expresses understanding. Will get PT assessment for possible SNF placement.  Afib now better rate controlled. Will c/w metoprolol and digoxin for now. He is not a candidate for cardizem given low EF. Cardio f/u appreciated.

## 2014-10-26 NOTE — Clinical Social Work Note (Signed)
Clinical Social Work Assessment  Patient Details  Name: [name] MRN: [id number] Date of Birth: 1945/10/30  Date of referral:  10/26/14               Reason for consult:  Facility Placement, Discharge Planning                Permission sought to share information with:  Family Supports, Chartered certified accountant granted to share information::  Yes, Verbal Permission Granted  Name::     Pensions consultant::  SNFs  Relationship::     Contact Information:     Housing/Transportation Living arrangements for the past 2 months:  Single Family Home Source of Information:  Patient Patient Interpreter Needed:  None Criminal Activity/Legal Involvement Pertinent to Current Situation/Hospitalization:  No - Comment as needed Significant Relationships:  Adult Children Lives with:  Adult Children Do you feel safe going back to the place where you live?  Yes Need for family participation in patient care:  No (Coment)  Care giving concerns:  Patient reports that he thinks he would benefit from going to a SNF at discharge. The patient shares that he lives with his son but his son is not at home to assist him all the time.   Social Worker assessment / plan:  Patient is a 69yo AA male that presents with need for need SNF placement. Patient states that he lives with his son at home but doesn't think he can manage by himself when his son is not there. CSW explained SNF search/placement process and answered patient's questions. CSW inquired about why patient has not been working with PT. Patient states that he has been very dizzy. CSW has encouraged patient to engage with PT as their recommendation is needed for [facility] to consider authorizing the patient for SNF. The patient has been to [facility] in the past and would prefer this facility at discharge.   Employment status:  Disabled (Comment on whether or not currently receiving Disability) Insurance information:  Managed Medicare PT  Recommendations:  Not assessed at this time Information / Referral to community resources:  [place]  Patient/Family's Response to care:  Patient does not have any complaints and seems content.  Patient/Family's Understanding of and Emotional Response to Diagnosis, Current Treatment, and Prognosis:  Patient appears to have fair insight into understand of diagnosis and reason for admission. CSW seems to be understanding of what is post DC needs will be and what he is capable of.   Emotional Assessment Appearance:  Appears stated age Attitude/Demeanor/Rapport:  Other (Apporpriate) Affect (typically observed):  Accepting, Appropriate, Calm, Stable Orientation:  Oriented to Self, Oriented to Place, Oriented to  Time, Oriented to Situation Alcohol / Substance use:  Tobacco Use, Illicit Drugs Psych involvement (Current and /or in the community):  No (Comment)  Discharge Needs  Concerns to be addressed:  Discharge Planning Concerns, Basic Needs (Patient requests information on mobile meals program) Readmission within the last 30 days:  Yes Current discharge risk:  Physical Impairment, Psychiatric Illness, Chronically ill Barriers to Discharge:  Continued Medical Work up, Programmer, applications ([place])  Liz Beach MSW, [place], [place], [phone]

## 2014-10-26 NOTE — Progress Notes (Signed)
ANTICOAGULATION CONSULT NOTE - Follow-Up  Pharmacy Consult for coumadin Indication: atrial fibrillation  Allergies  Allergen Reactions  . Digoxin And Related Other (See Comments)    Dig toxicity 2009  . Penicillins Rash    Patient Measurements: Height: 6' 3" (190.5 cm) Weight: 147 lb 4.8 oz (66.815 kg) IBW/kg (Calculated) : 84.5  Vital Signs: Temp: 98.1 F (36.7 C) (06/08 0712) Temp Source: Oral (06/08 0712) BP: 103/86 mmHg (06/08 0712) Pulse Rate: 70 (06/08 1000)  Labs:  Recent Labs  10/24/14 1844 10/24/14 2117 10/25/14 0527 10/26/14 0700  HGB  --  12.6*  --  12.1*  HCT  --  38.3*  --  37.1*  PLT  --  320  --  311  LABPROT  --   --  21.7* 23.1*  INR 2.10  --  1.89* 2.06*  CREATININE  --  1.48*  --  1.28*  CKTOTAL  --   --   --  30*    Estimated Creatinine Clearance: 50.7 mL/min (by C-G formula based on Cr of 1.28).   Medical History: Past Medical History  Diagnosis Date  . Hypertension   . Chronic systolic heart failure     a. NICM - EF 35% with normal cors 2003. b. Last echo 11/2012 - EF 25-30%.  . Depression   . GERD (gastroesophageal reflux disease)   . Portal vein thrombosis   . Avascular necrosis of bones of both hips     Status post bilateral hip replacements  . Gastritis   . Alcohol abuse   . Erectile dysfunction   . Bipolar disorder   . Hydrocele, unspecified   . Intermittent claudication   . Lung nodule     Chest CT scan on 12/14/2008 showed a nodular opacity at the left lung base felt to most likely represent scarring.  Follow-up chest CT scan on 06/02/2010 showed parenchymal scarring in the left apex, left  lower lobe, and lingula; some of this scarring at the left base had a nodular appearance, unchanged. Chest 09/2012 - stable.  . 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  Thyroid scan 08/28/2009 was normal with no focal areas of abnormal increased or decreased activity seen; the uptake of I 131  sodium iodide at 24 hours was 5.7%.  TSH and free T4 normalized by 08/16/2009.  . Intestinal obstruction   . COPD (chronic obstructive pulmonary disease)   . Clostridium difficile colitis   . E coli bacteremia   . DVT (deep venous thrombosis)     He has hypercoagulability with multiple prior DVTs  . Pleural plaque     H/o asbestos exposure. Chest CT on 06/02/2010 showed stable extensive calcified pleural plaques involving the left hemithorax, consistent with asbestos related pleural disease.  . Weight loss     Normal colonoscopy by Dr. Olevia Perches on 11/06/2010.  . Tachycardia-bradycardia syndrome     a. 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 b. [employer] gen change 2010.  [name] PAF (paroxysmal atrial fibrillation)     Paroxysmal, failed medical therapy with amiodarone but has done very well with multaq;  d/c 2/2 to recurrent AFib;  Tikosyn load c/b Hyperkalemia during admx 05/2013 => rate control strategy  . 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  . Noncompliance   . Thrombocytopenia   . Osteoarthritis   . Spondylosis   . Anxiety   . CKD (  chronic kidney disease)     Renal U/S 12/04/2009 showed no pathological findings. Labs 12/04/2009 include normal ESR, C3, C4; neg ANA; SPEP showed nonspecific increase in the alpha-2 region with no M-spike; UPEP showed no monoclonal free light chains; urine IFE showed polyclonal increase in feree Kappa and/or free Lambda light chains. Baseline Cr reported 1.7-2.5.  . STEMI (ST elevation myocardial infarction) 10/11/2014  . Hyperlipemia   . CHF (congestive heart failure)   . On home oxygen therapy     "3L prn" (10/24/2014)    Medications:  Scheduled:  . aspirin EC  81 mg Oral Daily  . buPROPion  75 mg Oral BID  . digoxin  0.0625 mg Oral Daily  . feeding supplement (ENSURE ENLIVE)  237 mL Oral BID BM  . metoprolol tartrate  75 mg Oral TID  . multivitamin with  minerals  1 tablet Oral Daily  . pantoprazole  80 mg Oral Daily  . ramipril  2.5 mg Oral Daily  . rosuvastatin  20 mg Oral q1800  . sodium chloride  3 mL Intravenous Q12H  . tiotropium  18 mcg Inhalation Daily  . Warfarin - Pharmacist Dosing Inpatient   Does not apply q1800    Assessment: 69 yo male here with SOB and dizziness. He is on coumadin  PTA for afib and pharmacy has been consulted to dose. INR today= 2.06  Home coumadin dose: 7.42m daily except 556mTTSa (last dose taken 10/23/14)  Patient is now therapeutic with increased dose of coumadin.  Goal of Therapy:  INR 2-3 Monitor platelets by anticoagulation protocol: Yes   Plan:  -Warfarin 7.5 mg x 1 tonight -Daily PT/INR -Monitor s/sx of bleeding  Thanks for allowing pharmacy to be a part of this patient's care.  LoExcell SeltzerPharmD Clinical Pharmacist, [phone]/12/2014 10:21 AM

## 2014-10-26 NOTE — Progress Notes (Signed)
OT Cancellation Note  Patient Details Name: STEVAN EBERWEIN MRN: [id number] DOB: [birth year]   Cancelled Treatment:    Reason Eval/Treat Not Completed: Other (comment). Pt reports he has headache and is dizzy. Not agreeable to get up with OT at this time.   Benito Mccreedy OTR/L [phone] 10/26/2014, 11:51 AM

## 2014-10-26 NOTE — Progress Notes (Signed)
Subjective:    No acute events overnight. Patient continues to have light-headedness with standing and sometimes when sitting. Also complains of a headache that is worse with bright lights, pain improved with home dose of Percocet. He denies any dyspnea while lying down. He declined to work with PT today because he was "too tired" and had a headache.   Objective:    Vital Signs:   Temp:  [97.9 F (36.6 C)-98.8 F (37.1 C)] 98.6 F (37 C) (06/08 1416) Pulse Rate:  [70-110] 83 (06/08 1416) Resp:  [16-20] 16 (06/08 1416) BP: (103-125)/(78-89) 110/80 mmHg (06/08 1416) SpO2:  [97 %-99 %] 97 % (06/08 1416) Last BM Date: 10/24/14  Intake/Output:   Intake/Output Summary (Last 24 hours) at 10/26/14 1426 Last data filed at 10/26/14 1421  Gross per 24 hour  Intake    750 ml  Output   1345 ml  Net   -595 ml      Physical Exam: General: Thin appearing man, lying in bed, no acute distress.  Heart: Normal rate, irregularly irregular rhythm. No murmurs appreciated Lungs: CTAB, normal WOB on room air, no wheeze, rhonchi, or crackles. Abdomen: Soft, non-tender, non-distended, normal BS Extremities: No edema, left groin with CDI dressing/bandage  Labs:  Basic Metabolic Panel:  Recent Labs Lab 10/24/14 2117 10/26/14 0700  NA 143 137  K 4.6 4.4  CL 110 105  CO2 24 24  GLUCOSE 86 72  BUN 34* 28*  CREATININE 1.48* 1.28*  CALCIUM 8.9 8.5*  MG 2.0  --     Liver Function Tests:  Recent Labs Lab 10/24/14 2117 10/26/14 0700  AST 74* 37  ALT 68* 48  ALKPHOS 56 49  BILITOT 0.7 0.7  PROT 6.4* 5.7*  ALBUMIN 3.0* 2.7*   CBC:  Recent Labs Lab 10/24/14 2117 10/26/14 0700  WBC 9.1 7.1  NEUTROABS 5.0  --   HGB 12.6* 12.1*  HCT 38.3* 37.1*  MCV 91.0 90.9  PLT 320 311   Cardiac Enzymes:  Recent Labs Lab 10/26/14 0700  CKTOTAL 30*   CBG:  Recent Labs Lab 10/24/14 2157  GLUCAP 85    Microbiology: None   Coagulation Studies:  Recent Labs  10/24/14 1844  10/25/14 0527 10/26/14 0700  LABPROT  --  21.7* 23.1*  INR 2.10 1.89* 2.06*     Other results: EKG: none new  Imaging: X-ray Chest Pa And Lateral  10/24/2014   CLINICAL DATA:  Dyspnea  EXAM: CHEST  2 VIEW  COMPARISON:  None.  FINDINGS: Lungs are hyperinflated. There is left basilar pleural plaque. There is left basilar airspace disease unchanged compared with the prior exams likely reflecting scarring. There is no pneumothorax. There is a nodular opacity in the right lower lung which reflect superimposition of the inferior aspect of the scapula overlying a posterior rib. There is stable cardiomegaly. There is thoracic aortic atherosclerosis. There is a dual lead AICD. There is no acute osseous abnormality.  IMPRESSION: No active cardiopulmonary disease.  Chronic changes at the left lung base.   Electronically Signed   By: Kathreen Devoid   On: 10/24/2014 21:48     Medications:    Infusions: None    Scheduled Medications: . aspirin EC  81 mg Oral Daily  . buPROPion  75 mg Oral BID  . digoxin  0.0625 mg Oral Daily  . feeding supplement (ENSURE ENLIVE)  237 mL Oral BID BM  . metoprolol tartrate  75 mg Oral TID  . multivitamin with minerals  1 tablet Oral Daily  . pantoprazole  80 mg Oral Daily  . ramipril  2.5 mg Oral Daily  . rosuvastatin  20 mg Oral q1800  . sodium chloride  3 mL Intravenous Q12H  . tiotropium  18 mcg Inhalation Daily  . warfarin  7.5 mg Oral ONCE-1800  . Warfarin - Pharmacist Dosing Inpatient   Does not apply q1800    PRN Medications: albuterol, ondansetron, oxyCODONE-acetaminophen **AND** oxyCODONE   Assessment/ Plan:     69 year old man with PMHx most significant for CHF, chronic AFib with RVR, tachy-brady syndrome w/ ICD, recent admission 5/24-6/1 for Afib with complications of STEMI, TIA, and acute lower extremity ischemia, and COPD who was admitted with dyspnea on exertion and light-headedness on standing, which are chronic issues.   Active Problems:    Essential hypertension   BRADYCARDIA-TACHYCARDIA SYNDROME   COPD (chronic obstructive pulmonary disease)   Chronic kidney disease   Chronic combined systolic and diastolic congestive heart failure   Weight loss   Lightheadedness   Non-ischemic cardiomyopathy   Severe malnutrition   Atrial fibrillation with RVR   Dyspnea   Protein-calorie malnutrition, severe   Dyspnea on exertion-- Most likely due to combination of deconditioning, Afib with RVR (HR up to 129), and chronic COPD. Patient has CHF but no signs/symptoms of fluid overload. Pt at risk for PE given history of embolic complications and admission INR slightly subtherapeutic at 1.89. However, DOE is relatively chronic and does not appear worse from baseline. CXR clear and no hypoxia on room air.  - PT/OT consulted, encouraged patient to work with PT today given likely need for SNF - AFib with RVR management as below - Continue home albuterol and Spiriva inhalers  Light-headedness-- Patient complains of light-headedness when standing, most consistent with orthostatic hypotension in setting of decreased PO intake. However, no orthostasis in clinic 6/6 (appropriate increase in BP, but HR dropped from 129 lying to 78 standing reflecting possible autonomic dysfunction). Afib also likely contributing to light-headedness, although HR has remained normal to mildly tachycardic during admission. - AFib with RVR management as below - PT/OT consult as above - Encourage PO intake, continue home Ensure - SW consult as below given barriers to food access  Atrial fibrillation w/ RVR-- Chronic and difficult to control, as patient has failed medical therapy with amiodarone (due to thyroiditis) and Tikosyn. Cardiology evaluated patient during last admission, and rate-control strategy was chosen as patient is not a candidate for antiarrhythmic therapy, including ablation. Restarted on digoxin during last admission, digoxin level now low at 0.3.  CHA2DS2-VASc Score of 6 is equal to 9.7 % stroke rate/year.  - Cardiology consulted, appreciate recommendations - Given extra dose of digoxin 0.125 mg x1 dose yesterday 6.7 given low digoxin level of 0.3 on admission. - Continue home dose digoxin 0.0625 mg daily - Continue ramipril 2.5 mg daily (decreased from 2.5 mg BID on 6.7 to allow for higher metoprolol dose if needed) - Continue metoprolol 75 mg BID, consider increase if pt becomes more tachycardic - Continue Coumadin per pharmacy. INR now 2.06, up from 1.89 on admission.  - Daily INR  Failure to thrive-- BMI 18.4 with about 30 pounds weight loss in past year. Etiology most likely multifactorial, as patient reports decreased appetite, difficulty accessing food due to immobility, and difficulty chewing (lost teeth, no dentures). COPD and depression may be contributing as well. Previous workup for FTT has been negative (nl colonoscopy 2012, CT chest/abd/pelvis w/ contrast 10/13/14 w/ no evidence  malignancy). - Continue home Ensure - Nutrition consult - Social work consult as above given barriers to food access  Elevated liver enzymes: AST 74, ALT 68 ( AST 72 and ALT WNL on 5/23, likely due to STEMI). No history of hepatitis, no GI symptoms or abdominal pain. Possibly secondary to starting Crestor last month.  - Continue Crestor, no need to stop in setting of asymptomatic AST/ALT elevations  HTN - Continue ramipril 2.5 mg daily as above - Continue home metoprolol 75 mg BID  Chronic systolic CHF w/ ICD-- ECHO in May 2016 revealed EF of 30-35% w/ diffuse hypokinesis in setting of STEMI.  - needs repeat ECHO in 2 months per cardiology - continue home metoprolol and ramipril as above  CKD 3-- Admission Cr 1.48, now down to 1.28. At or below expected baseline. - Continue to monitor  COPD - cont home Spiriva and PRN albuterol  HLD--  - Continue rosuvastatin 20mg  daily  BPH -- Continue home flomax 0.4mg  qd  Depression -  Continue home wellbutrin 75mg  BID  Chronic hip pain-- on Percocet at home for chronic pain. - Continue home dose percocet 10-325 mg q4H prn pain (formulary equivalent: 5 mg oxycodone plus Percocet 5-325 mg q4H PRN pain)  GERD-- - On home prilosec 40mg  at home, substitute Protonix 80mg  daily while admitted.   DVT ppx--coumadin per pharmacy. INR 1.89, slightly subtherapeutic.   FEN - Regular diet, plus Ensure - Nutrition consulted  Code: FULL  Dispo: Disposition is deferred at this time, awaiting improvement of current medical problems.   The patient does have a current PCP Bertha Stakes, MD) and does need an [facility] hospital follow-up appointment after discharge.  The patient does not have transportation limitations that hinder transportation to clinic appointments.  SERVICE NEEDED AT [place]         Y = Yes, Blank = No PT:   OT:   RN:   Equipment:   Other:      Length of Stay: 2 day(s)   Signed: Betsey Amen, MS4

## 2014-10-26 NOTE — Telephone Encounter (Signed)
-----   Message from Mena Goes, RN sent at 10/26/2014 10:17 AM EDT ----- Regarding: Schedule   FYI from Dr. Donnetta Hutching  ----- Message -----    From: Rosetta Posner, MD    Sent: 10/26/2014  10:08 AM      To: [facility] Charge Pool  The patient was on my schedule as an outpatient yesterday for follow-up of his left femoral embolectomy. Had been readmitted. I saw him today on 5 W. Does not need ongoing follow-up. Does not need a office visit

## 2014-10-26 NOTE — Clinical Social Work Placement (Signed)
   CLINICAL SOCIAL WORK PLACEMENT  NOTE  Date:  10/26/2014  Patient Details  Name: [name] MRN: [id number] Date of Birth: [birth year]  Clinical Social Work is seeking post-discharge placement for this patient at the [place] level of care (*CSW will initial, date and re-position this form in  chart as items are completed):  Yes   Patient/family provided with [place] Work Department's list of facilities offering this level of care within the geographic area requested by the patient (or if unable, by the patient's family).  Yes   Patient/family informed of their freedom to choose among providers that offer the needed level of care, that participate in Medicare, Medicaid or managed care program needed by the patient, have an available bed and are willing to accept the patient.  Yes   Patient/family informed of [place]'s ownership interest in [facility] and [facility], as well as of the fact that they are under no obligation to receive care at these facilities.  PASRR submitted to EDS on       PASRR number received on       Existing PASRR number confirmed on 10/26/14     FL2 transmitted to all facilities in geographic area requested by pt/family on 10/26/14     FL2 transmitted to all facilities within larger geographic area on       Patient informed that his/her managed care company has contracts with or will negotiate with certain facilities, including the following:            Patient/family informed of bed offers received.  Patient chooses bed at       Physician recommends and patient chooses bed at      Patient to be transferred to   on  .  Patient to be transferred to facility by       Patient family notified on   of transfer.  Name of family member notified:        PHYSICIAN       Additional Comment:    ____________________________________________ Liz Beach MSW, [place], [place], [phone]

## 2014-10-26 NOTE — Progress Notes (Signed)
Subjective: NAEON. Pt doing well this AM feels he is at his baseline. Complaining of not feeling up to PT.   Objective: Vital signs in last 24 hours: Filed Vitals:   10/25/14 2155 10/26/14 0712 10/26/14 0801 10/26/14 1000  BP: 115/78 103/86    Pulse:  100  70  Temp: 98.8 F (37.1 C) 98.1 F (36.7 C)    TempSrc: Oral Oral    Resp: 20 20    Height:      Weight:      SpO2:  99% 97%    Weight change:   Intake/Output Summary (Last 24 hours) at 10/26/14 1416 Last data filed at 10/26/14 1202  Gross per 24 hour  Intake    750 ml  Output   1245 ml  Net   -495 ml   PHYSICAL EXAMINATION: General: Thin appearing, NAD Heart: Tachycardic, irregularly irregular rhythm  Lungs: Increased BS, no ronchi, wheezing, or rales Abdomen: Soft, non-tender, non-distended, normal BS Extremities: No edema, left groin with CDI dressing/bandage  Lab Results: Basic Metabolic Panel:  Recent Labs Lab 10/24/14 2117 10/26/14 0700  NA 143 137  K 4.6 4.4  CL 110 105  CO2 24 24  GLUCOSE 86 72  BUN 34* 28*  CREATININE 1.48* 1.28*  CALCIUM 8.9 8.5*  MG 2.0  --    Liver Function Tests:  Recent Labs Lab 10/24/14 2117 10/26/14 0700  AST 74* 37  ALT 68* 48  ALKPHOS 56 49  BILITOT 0.7 0.7  PROT 6.4* 5.7*  ALBUMIN 3.0* 2.7*   CBC:  Recent Labs Lab 10/24/14 2117 10/26/14 0700  WBC 9.1 7.1  NEUTROABS 5.0  --   HGB 12.6* 12.1*  HCT 38.3* 37.1*  MCV 91.0 90.9  PLT 320 311   Coagulation:  Recent Labs Lab 10/24/14 1844 10/25/14 0527 10/26/14 0700  LABPROT  --  21.7* 23.1*  INR 2.10 1.89* 2.06*   Studies/Results: X-ray Chest Pa And Lateral  10/24/2014   CLINICAL DATA:  Dyspnea  EXAM: CHEST  2 VIEW  COMPARISON:  None.  FINDINGS: Lungs are hyperinflated. There is left basilar pleural plaque. There is left basilar airspace disease unchanged compared with the prior exams likely reflecting scarring. There is no pneumothorax. There is a nodular opacity in the right lower lung which  reflect superimposition of the inferior aspect of the scapula overlying a posterior rib. There is stable cardiomegaly. There is thoracic aortic atherosclerosis. There is a dual lead AICD. There is no acute osseous abnormality.  IMPRESSION: No active cardiopulmonary disease.  Chronic changes at the left lung base.   Electronically Signed   By: Kathreen Devoid   On: 10/24/2014 21:48   Medications: I have reviewed the patient's current medications. Scheduled Meds: . aspirin EC  81 mg Oral Daily  . buPROPion  75 mg Oral BID  . digoxin  0.0625 mg Oral Daily  . feeding supplement (ENSURE ENLIVE)  237 mL Oral BID BM  . metoprolol tartrate  75 mg Oral TID  . multivitamin with minerals  1 tablet Oral Daily  . pantoprazole  80 mg Oral Daily  . ramipril  2.5 mg Oral Daily  . rosuvastatin  20 mg Oral q1800  . sodium chloride  3 mL Intravenous Q12H  . tiotropium  18 mcg Inhalation Daily  . warfarin  7.5 mg Oral ONCE-1800  . Warfarin - Pharmacist Dosing Inpatient   Does not apply q1800   Continuous Infusions:  PRN Meds:.albuterol, ondansetron, oxyCODONE-acetaminophen **AND** oxyCODONE Assessment/Plan:  Safety issue: biggest concern is pt home safety given multiple medical problems. Pt is amenable to SNF.  -PT/OT consults  Symptomatic persistent Afib with RVR complicated by thromboembolism - Current HR 103 with irregularly irregular rhythm. INR herapeutic today. Pt with CHADS2 score of 4 requiring life-long AC therapy.-Appreciate cardiology recommendations  -Monitor on telemetry  -Continue coumadin per pharmacy with goal INR 2-3 -Increase digoxin 0.0625 mg daily today for rate control and observe, dig level low at 0.3, however has had toxicity in the past -Continue metoprolol 75 mg TID for rate control, consider transition to toprol-xL  Hypertension - Currently normotensive. Continue home meds metoprolol 75 mg TID ramipril 2.5 mg daily   Chronic Systolic CHF - Currently euvolemic. On BB, ACEi,  ASA  Severe Malnutrition -BMI 19 with 24 lb weight loss over last 5 months.  -Appreciate RD recommendations -Encourage dietary intake  -Continue ensure 237 mL TID between meals   CKD Stage 3 - Cr 1.48 below baseline Cr of 1.6 with normal urine output. -Monitor daily weights and strict I & O's -Avoid nephrotoxins   COPD - Currently with no exacerbation.  -Continue spiriva and albuterol nebulizer Q 6 hr PRN   Diet: Regular DVT Ppx: Coumadin  Code: Full   Dispo: Disposition is deferred at this time, awaiting improvement of current medical problems.  Anticipated discharge in approximately 2-5 day(s).   The patient does have a current PCP Bertha Stakes, MD) and does need an [facility] hospital follow-up appointment after discharge.  The patient does have transportation limitations that hinder transportation to clinic appointments.  .Services Needed at time of discharge: Y = Yes, Blank = No PT:   OT:   RN:   Equipment:   Other:     LOS: 2 days   Jerrye Noble, MD 10/26/2014, 2:16 PM

## 2014-10-27 ENCOUNTER — Encounter: Payer: Self-pay | Admitting: *Deleted

## 2014-10-27 DIAGNOSIS — I130 Hypertensive heart and chronic kidney disease with heart failure and stage 1 through stage 4 chronic kidney disease, or unspecified chronic kidney disease: Secondary | ICD-10-CM | POA: Diagnosis not present

## 2014-10-27 DIAGNOSIS — I5022 Chronic systolic (congestive) heart failure: Secondary | ICD-10-CM | POA: Diagnosis not present

## 2014-10-27 DIAGNOSIS — I10 Essential (primary) hypertension: Secondary | ICD-10-CM | POA: Diagnosis not present

## 2014-10-27 DIAGNOSIS — I480 Paroxysmal atrial fibrillation: Secondary | ICD-10-CM | POA: Diagnosis not present

## 2014-10-27 DIAGNOSIS — E43 Unspecified severe protein-calorie malnutrition: Secondary | ICD-10-CM | POA: Diagnosis not present

## 2014-10-27 DIAGNOSIS — G4700 Insomnia, unspecified: Secondary | ICD-10-CM | POA: Diagnosis not present

## 2014-10-27 DIAGNOSIS — J449 Chronic obstructive pulmonary disease, unspecified: Secondary | ICD-10-CM | POA: Diagnosis not present

## 2014-10-27 DIAGNOSIS — I129 Hypertensive chronic kidney disease with stage 1 through stage 4 chronic kidney disease, or unspecified chronic kidney disease: Secondary | ICD-10-CM | POA: Diagnosis not present

## 2014-10-27 DIAGNOSIS — I429 Cardiomyopathy, unspecified: Secondary | ICD-10-CM | POA: Diagnosis not present

## 2014-10-27 DIAGNOSIS — Z5189 Encounter for other specified aftercare: Secondary | ICD-10-CM | POA: Diagnosis not present

## 2014-10-27 DIAGNOSIS — Z7901 Long term (current) use of anticoagulants: Secondary | ICD-10-CM | POA: Diagnosis not present

## 2014-10-27 DIAGNOSIS — I481 Persistent atrial fibrillation: Secondary | ICD-10-CM | POA: Diagnosis not present

## 2014-10-27 DIAGNOSIS — F329 Major depressive disorder, single episode, unspecified: Secondary | ICD-10-CM | POA: Diagnosis not present

## 2014-10-27 DIAGNOSIS — I4891 Unspecified atrial fibrillation: Secondary | ICD-10-CM | POA: Diagnosis not present

## 2014-10-27 DIAGNOSIS — F341 Dysthymic disorder: Secondary | ICD-10-CM | POA: Diagnosis not present

## 2014-10-27 DIAGNOSIS — N183 Chronic kidney disease, stage 3 (moderate): Secondary | ICD-10-CM | POA: Diagnosis not present

## 2014-10-27 DIAGNOSIS — R2681 Unsteadiness on feet: Secondary | ICD-10-CM | POA: Diagnosis not present

## 2014-10-27 DIAGNOSIS — K219 Gastro-esophageal reflux disease without esophagitis: Secondary | ICD-10-CM | POA: Diagnosis not present

## 2014-10-27 DIAGNOSIS — R0600 Dyspnea, unspecified: Secondary | ICD-10-CM | POA: Diagnosis not present

## 2014-10-27 DIAGNOSIS — D682 Hereditary deficiency of other clotting factors: Secondary | ICD-10-CM | POA: Diagnosis not present

## 2014-10-27 DIAGNOSIS — R5381 Other malaise: Secondary | ICD-10-CM | POA: Diagnosis not present

## 2014-10-27 DIAGNOSIS — E785 Hyperlipidemia, unspecified: Secondary | ICD-10-CM | POA: Diagnosis not present

## 2014-10-27 DIAGNOSIS — M6281 Muscle weakness (generalized): Secondary | ICD-10-CM | POA: Diagnosis not present

## 2014-10-27 DIAGNOSIS — I5042 Chronic combined systolic (congestive) and diastolic (congestive) heart failure: Secondary | ICD-10-CM | POA: Diagnosis not present

## 2014-10-27 LAB — PROTIME-INR
INR: 2.2 — AB (ref 0.00–1.49)
Prothrombin Time: 24.2 seconds — ABNORMAL HIGH (ref 11.6–15.2)

## 2014-10-27 MED ORDER — WARFARIN SODIUM 7.5 MG PO TABS
7.5000 mg | ORAL_TABLET | Freq: Every day | ORAL | Status: DC
  Filled 2014-10-27: qty 1

## 2014-10-27 MED ORDER — WARFARIN SODIUM 2.5 MG PO TABS: 7.5000 mg | ORAL_TABLET | ORAL | Status: DC

## 2014-10-27 MED ORDER — WARFARIN SODIUM 7.5 MG PO TABS
7.5000 mg | ORAL_TABLET | Freq: Once | ORAL | Status: DC
  Filled 2014-10-27: qty 1

## 2014-10-27 MED ORDER — METOPROLOL SUCCINATE ER 200 MG PO TB24: 200.0000 mg | ORAL_TABLET | Freq: Every day | ORAL | Status: DC

## 2014-10-27 MED ORDER — OXYCODONE-ACETAMINOPHEN 10-325 MG PO TABS: 1.0000 | ORAL_TABLET | ORAL | Status: DC | PRN

## 2014-10-27 MED ORDER — METOPROLOL SUCCINATE ER 100 MG PO TB24
200.0000 mg | ORAL_TABLET | Freq: Every day | ORAL | Status: DC
  Administered 2014-10-27: 200 mg via ORAL
  Filled 2014-10-27: qty 2

## 2014-10-27 NOTE — Progress Notes (Signed)
Utilization Review completed. Lakrisha Iseman RN BSN CM 

## 2014-10-27 NOTE — Progress Notes (Signed)
Patient given and explained code 44 papers. Patient signed and initial remained with patient.

## 2014-10-27 NOTE — Clinical Social Work Placement (Signed)
   CLINICAL SOCIAL WORK PLACEMENT  NOTE  Date:  10/27/2014  Patient Details  Name: [name] MRN: [id number] Date of Birth: 1946/05/09  Clinical Social Work is seeking post-discharge placement for this patient at the [place] level of care (*CSW will initial, date and re-position this form in  chart as items are completed):  Yes   Patient/family provided with [place] Work Department's list of facilities offering this level of care within the geographic area requested by the patient (or if unable, by the patient's family).  Yes   Patient/family informed of their freedom to choose among providers that offer the needed level of care, that participate in Medicare, Medicaid or managed care program needed by the patient, have an available bed and are willing to accept the patient.  Yes   Patient/family informed of [place]'s ownership interest in [facility] and [facility], as well as of the fact that they are under no obligation to receive care at these facilities.  PASRR submitted to EDS on       PASRR number received on       Existing PASRR number confirmed on 10/26/14     FL2 transmitted to all facilities in geographic area requested by pt/family on 10/26/14     FL2 transmitted to all facilities within larger geographic area on       Patient informed that his/her managed care company has contracts with or will negotiate with certain facilities, including the following:        Yes   Patient/family informed of bed offers received.  Patient chooses bed at [facility]     Physician recommends and patient chooses bed at      Patient to be transferred to [facility] on 10/27/14.  Patient to be transferred to facility by Ambulance     Patient family notified on 10/27/14 of transfer.  Name of family member notified:  Patient to notify his daughter over the phone     PHYSICIAN Please sign FL2     Additional Comment:     _______________________________________________ Malon Kindle, LCSW 10/27/2014, 11:20 AM

## 2014-10-27 NOTE — Evaluation (Signed)
Occupational Therapy Evaluation Patient Details Name: [name] MRN: [id number] DOB: April 12, 1946 Today's Date: 10/27/2014    History of Present Illness 69 y/o male with PMH of HTN, depression, portal vein thrombosis, non ischemic CM, tachy brady syndrome s//p ICD, recurrent DVT, COPD, afib who p/w DOE and lightheadedness.   Clinical Impression   PTA pt reports independence with ADLs with use of RW. Pt is currently limited by generalized weakness and balance deficits. He denied dizziness during this session. Pt requires Supervision/Min guard for functional mobility and ADLs due to fatigue and safety and will benefit from SNF for ST rehab. All further OT needs can be met at [facility]      Follow Up Recommendations  SNF;Supervision/Assistance - 24 hour    Equipment Recommendations  Other (comment) (defer to SNF)    Recommendations for Other Services       Precautions / Restrictions Precautions Precautions: Fall Restrictions Weight Bearing Restrictions: No      Mobility Bed Mobility Overal bed mobility: Modified Independent                Transfers Overall transfer level: Needs assistance Equipment used: Rolling walker (2 wheeled) Transfers: Sit to/from Stand Sit to Stand: Supervision         General transfer comment: Supervision for safety. Good hand placement. No dizziness.          ADL Overall ADL's : Needs assistance/impaired                                       General ADL Comments: Pt requires min guard/supervision for functional mobility due to fatigue. Pt ambulated in room and performed grooming tasks in standing. He required one standing rest break and one seated rest break. Pt also reports RLE feeling weaker     Vision Additional Comments: No change from baseline          Pertinent Vitals/Pain Pain Assessment: No/denies pain     Hand Dominance Right   Extremity/Trunk Assessment Upper Extremity Assessment Upper Extremity  Assessment: Overall WFL for tasks assessed   Lower Extremity Assessment Lower Extremity Assessment: Defer to PT evaluation;Generalized weakness   Cervical / Trunk Assessment Cervical / Trunk Assessment: Normal   Communication Communication Communication: No difficulties   Cognition Arousal/Alertness: Awake/alert Behavior During Therapy: WFL for tasks assessed/performed Overall Cognitive Status: Within Functional Limits for tasks assessed                                Home Living Family/patient expects to be discharged to:: Skilled nursing facility                                        Prior Functioning/Environment Level of Independence: Independent with assistive device(s)        Comments: walker for mobility    OT Diagnosis: Generalized weakness;Other (comment) (dizziness)    End of Session Equipment Utilized During Treatment: Gait belt;Rolling walker Nurse Communication: Mobility status  Activity Tolerance: Patient tolerated treatment well Patient left: in chair;with call bell/phone within reach   Time: 1355-1410 OT Time Calculation (min): 15 min Charges:  OT General Charges $OT Visit: 1 Procedure OT Evaluation $Initial OT Evaluation Tier I: 1 Procedure G-Codes: OT G-codes **NOT FOR INPATIENT CLASS**  Functional Assessment Tool Used: clinical judgement Functional Limitation: Self care Self Care Current Status (Z6606): At least 1 percent but less than 20 percent impaired, limited or restricted Self Care Goal Status (T0160): At least 1 percent but less than 20 percent impaired, limited or restricted Self Care Discharge Status [phone]): At least 1 percent but less than 20 percent impaired, limited or restricted  Juluis Rainier 10/27/2014, 2:33 PM  Cyndie Chime, OTR/L Occupational Therapist [phone] (pager)

## 2014-10-27 NOTE — Progress Notes (Signed)
Subjective:    No acute events overnight. Patient reports feeling better. He is less light-headed and short of breath when standing, although he had 1 episode of dyspnea and elevated HR last night when out of bed. His appetite is improving gradually. PT evaluation yesterday recommended SNF.    Objective:    Vital Signs:   Temp:  [98.2 F (36.8 C)-98.6 F (37 C)] 98.2 F (36.8 C) (06/09 0540) Pulse Rate:  [70-94] 84 (06/09 0540) Resp:  [16-22] 18 (06/09 0540) BP: (98-122)/(77-80) 98/77 mmHg (06/09 0540) SpO2:  [97 %-100 %] 98 % (06/09 0839) Last BM Date: 10/24/14  24-hour weight change: Weight change:   Intake/Output:   Intake/Output Summary (Last 24 hours) at 10/27/14 0841 Last data filed at 10/27/14 0544  Gross per 24 hour  Intake    460 ml  Output   1260 ml  Net   -800 ml      Physical Exam: General: Thin appearing man, lying in bed, no acute distress. Alert and cooperative.  Heart: Normal rate, irregularly irregular rhythm. No murmurs appreciated Lungs: CTAB, normal WOB on room air, no wheeze, rhonchi, or crackles. Abdomen: Soft, non-tender, non-distended, normal BS Extremities: No lower extremity edema or cyanosis.   Labs:  Basic Metabolic Panel:  Recent Labs Lab 10/24/14 2117 10/26/14 0700  NA 143 137  K 4.6 4.4  CL 110 105  CO2 24 24  GLUCOSE 86 72  BUN 34* 28*  CREATININE 1.48* 1.28*  CALCIUM 8.9 8.5*  MG 2.0  --     Liver Function Tests:  Recent Labs Lab 10/24/14 2117 10/26/14 0700  AST 74* 37  ALT 68* 48  ALKPHOS 56 49  BILITOT 0.7 0.7  PROT 6.4* 5.7*  ALBUMIN 3.0* 2.7*    CBC:  Recent Labs Lab 10/24/14 2117 10/26/14 0700  WBC 9.1 7.1  NEUTROABS 5.0  --   HGB 12.6* 12.1*  HCT 38.3* 37.1*  MCV 91.0 90.9  PLT 320 311    Cardiac Enzymes:  Recent Labs Lab 10/26/14 0700  CKTOTAL 30*    CBG:  Recent Labs Lab 10/24/14 2157  GLUCAP 85    Microbiology: None new   Coagulation Studies:  Recent Labs   10/24/14 1844 10/25/14 0527 10/26/14 0700 10/27/14 0551  LABPROT  --  21.7* 23.1* 24.2*  INR 2.10 1.89* 2.06* 2.20*    Imaging: None new   Medications:    Infusions: None    Scheduled Medications: . aspirin EC  81 mg Oral Daily  . buPROPion  75 mg Oral BID  . digoxin  0.0625 mg Oral Daily  . feeding supplement (ENSURE ENLIVE)  237 mL Oral BID BM  . metoprolol tartrate  75 mg Oral TID  . multivitamin with minerals  1 tablet Oral Daily  . pantoprazole  80 mg Oral Daily  . ramipril  2.5 mg Oral Daily  . rosuvastatin  20 mg Oral q1800  . sodium chloride  3 mL Intravenous Q12H  . tiotropium  18 mcg Inhalation Daily  . Warfarin - Pharmacist Dosing Inpatient   Does not apply q1800    PRN Medications: albuterol, ondansetron, oxyCODONE-acetaminophen **AND** oxyCODONE   Assessment/ Plan:     69 year old man with PMHx most significant for CHF, chronic AFib with RVR, tachy-brady syndrome w/ ICD, recent admission 5/24-6/1 for Afib with complications of STEMI, TIA, and acute lower extremity ischemia, and COPD who was admitted with dyspnea on exertion and light-headedness on standing, which are chronic issues.  Overall, he is clinically improving.   Active Problems:   Essential hypertension   BRADYCARDIA-TACHYCARDIA SYNDROME   COPD (chronic obstructive pulmonary disease)   Chronic kidney disease   Chronic combined systolic and diastolic congestive heart failure   Weight loss   Lightheadedness   Non-ischemic cardiomyopathy   Severe malnutrition   Atrial fibrillation with RVR   Dyspnea   Protein-calorie malnutrition, severe   Dyspnea on exertion-- Most likely due to combination of deconditioning, Afib with RVR, and chronic COPD. Patient has CHF but no signs/symptoms of fluid overload. DOE is relatively chronic and does not appear worse than baseline. Improved and able to work with PT yesterday. - AFib with RVR management as below - Continue home albuterol and Spiriva  inhalers - PT/OT consulted - recommended SNF  Light-headedness-- Also improved s/p fluid resuscitation, nutrition, and Afib with RVR management. Less likely to be orthostatic hypotension as no orthostasis on presentation. Mild hypotension may be contributing.  - AFib with RVR management as below - PT/OT consult - SNF - Encourage PO intake, continue home Ensure - SW consult as below given barriers to food access  Atrial fibrillation w/ RVR-- Chronic and difficult to control, as patient has failed medical therapy with amiodarone (due to thyroiditis) and Tikosyn. Cardiology evaluated patient during last admission, and rate-control strategy was chosen as patient is not a candidate for antiarrhythmic therapy, including ablation. Restarted on digoxin during last admission, presented with low digoxin level at 0.3. CHA2DS2-VASc Score of 6 is equal to 9.7 % stroke rate/year.  - Cardiology consulted, appreciate recommendations - Continue home dose digoxin 0.0625 mg daily (given extra dose of 0.125 mg on 6/7) - Change from metoprolol 75 mg TID to Toprol 200 mg PO once daily - Hold ramipril 2.5 mg daily given soft BPs, consider re-starting if pt becomes hypertensive at SNF - Continue Coumadin per pharmacy. INR therapeutic at 2.2. - Daily INR  Failure to thrive-- BMI 18.4 with about 30 pounds weight loss in past year. Etiology likely multifactorial, as patient reports decreased appetite, difficulty accessing food due to immobility, and difficulty chewing (lost teeth, no dentures). COPD and depression may be contributing. Previous workup for FTT has been negative (nl colonoscopy 2012, CT chest/abd/pelvis w/ contrast 10/13/14 w/ no evidence malignancy). - Continue home Ensure twice daily - Nutrition consulted, appreciate recommendations - Social work consult as above given barriers to food access  Elevated liver enzymes: AST 74, ALT 68 ( AST 72 and ALT WNL on 5/23, likely due to STEMI). No history of  hepatitis, no GI symptoms or abdominal pain. Possibly secondary to starting Crestor last month.  - Continue Crestor, no need to stop in setting of asymptomatic AST/ALT elevations  HTN: Patient mildly hypotensive to 98/70.  - Discontinue ramipril 2.5 mg daily, consider re-starting if pt becomes hypertensive at SNF - Change from Metoprolol 75 mg TID to Toprol 200 mg PO once daily  Chronic systolic CHF w/ ICD-- ECHO in May 2016 revealed EF of 30-35% w/ diffuse hypokinesis in setting of STEMI.  - needs repeat ECHO in 2 months per cardiology - Changes to beta-blocker and ramipril as above  CKD 3-- Admission Cr 1.48, now down to 1.28. At or below expected baseline.  COPD - Continue home Spiriva and PRN albuterol  HLD--  - Continue rosuvastatin 20mg  daily  BPH -- Continue home flomax 0.4mg  qd  Depression - Continue home wellbutrin 75mg  BID  Chronic hip pain-- on Percocet at home for chronic pain. - Continue home  dose percocet 10-325 mg q4H prn pain (formulary equivalent: 5 mg oxycodone plus Percocet 5-325 mg q4H PRN pain)  GERD-- - Continue home prilosec 40mg  daily  DVT ppx--coumadin per pharmacy.  FEN - Regular diet, plus Ensure - Nutrition consulted  Code: FULL  Dispo: Plan to discharge to [facility] today  The patient does have a current PCP Bertha Stakes, MD) and does need an [facility] hospital follow-up appointment after discharge.  The patient does not have transportation limitations that hinder transportation to clinic appointments.   SERVICE NEEDED AT [place]         Y = Yes, Blank = No PT:   OT:   RN:   Equipment:   Other:      Length of Stay: 3 day(s)   Signed: Betsey Amen, MS4

## 2014-10-27 NOTE — Discharge Summary (Signed)
Name: [name] MRN: [id number] DOB: 10-20-1945 69 y.o. PCP: Bertha Stakes, MD  Date of Admission: 10/24/2014  6:26 PM Date of Discharge: 10/27/2014 Attending Physician: Aldine Contes, MD  Discharge Diagnosis:   Symptomatic Persistent Afib with RVR    Hypertension  Severe Malnutrition  Chronic Systolic CHF with ICD CKD Stage 3 COPD Hyperlipidemia  Depression  GERD DVT Prophylaxis      Discharge Medications:   Medication List    STOP taking these medications        ibuprofen 200 MG tablet  Commonly known as:  ADVIL,MOTRIN     Metoprolol Tartrate 75 MG Tabs     ramipril 2.5 MG capsule  Commonly known as:  ALTACE     triamcinolone cream 0.1 %  Commonly known as:  KENALOG      TAKE these medications        albuterol 108 (90 BASE) MCG/ACT inhaler  Commonly known as:  VENTOLIN HFA  Inhale 2 puffs into the lungs every 6 (six) hours as needed for wheezing or shortness of breath.     aspirin 81 MG EC tablet  Take 1 tablet (81 mg total) by mouth daily.     betamethasone dipropionate 0.05 % cream  Commonly known as:  DIPROLENE  Apply to affected areas on back and arm once daily for 2 weeks.  Do not use on face or in skin folds.     buPROPion 75 MG tablet  Commonly known as:  WELLBUTRIN  Take 1 tablet (75 mg total) by mouth 2 (two) times daily.     Digoxin 62.5 MCG Tabs  Take 0.0625 mg by mouth daily.     ENSURE PO  Take 1 Container by mouth daily.     metoprolol 200 MG 24 hr tablet  Commonly known as:  TOPROL-XL  Take 1 tablet (200 mg total) by mouth daily. Take with or immediately following a meal.     omeprazole 20 MG capsule  Commonly known as:  PRILOSEC  Take 2 capsules (40 mg total) by mouth daily.     oxyCODONE-acetaminophen 10-325 MG per tablet  Commonly known as:  PERCOCET  Take 1 tablet by mouth every 4 (four) hours as needed for pain. Do not take more than 5 doses in a 24 hour period.     rosuvastatin 20 MG tablet  Commonly known  as:  CRESTOR  Take 1 tablet (20 mg total) by mouth daily at 6 PM.     senna-docusate 8.6-50 MG per tablet  Commonly known as:  [employer] SENNA PLUS  Take 1 tablet by mouth daily as needed (for constipation).     tiotropium 18 MCG inhalation capsule  Commonly known as:  SPIRIVA  Place 1 capsule (18 mcg total) into inhaler and inhale daily.     warfarin 2.5 MG tablet  Commonly known as:  COUMADIN  Take 3 tablets (7.5 mg total) by mouth See admin instructions. Pt takes 5mg  every other day - alternates w/ 7mg  every other day        Disposition and follow-up:   [name] was discharged from [facility] in Good condition.  At the hospital follow up visit please address:  1.  Persistent Afib with RVR - Lopressor 75 mg TID was changed to toprol-Xl 200 mg daily. Also on digoxin 0.0625 mg daily (has had toxicity in the past).       Rampiril was discontinued due to soft blood pressures, can add  back 2.5 mg daily to BID if blood pressure tolerates (history of recent STEMI).        Last INR 2.2  - pt currently on coumadin 7.5 mg daily, needs daily INR checks, adjust for goal INR 2-3    He needs outpatient cardiac stress testing and repeat 2D-echo in 3 months     Make sure he attends scheduled appt with cardiologist Dr. Rayann Heman on 11/03/14 at 2:15 PM and needs follow-up scheduled with [facility], please call [phone] to set up.    2.  Labs / imaging needed at time of follow-up: INR daily checks   3.  Pending labs/ test needing follow-up: None  Follow-up Appointments: Pt needs outpatient follow-up with [facility], please call [phone]    Discharge Instructions: -Take toprol xl 200 mg daily -Don't take lopressor or ramipril  -Keep taking you other medications     Consultations: Treatment Team:  Rounding Lbcardiology, MD  Procedures Performed:  X-ray Chest Pa And Lateral  10/24/2014   CLINICAL  DATA:  Dyspnea  EXAM: CHEST  2 VIEW  COMPARISON:  None.  FINDINGS: Lungs are hyperinflated. There is left basilar pleural plaque. There is left basilar airspace disease unchanged compared with the prior exams likely reflecting scarring. There is no pneumothorax. There is a nodular opacity in the right lower lung which reflect superimposition of the inferior aspect of the scapula overlying a posterior rib. There is stable cardiomegaly. There is thoracic aortic atherosclerosis. There is a dual lead AICD. There is no acute osseous abnormality.  IMPRESSION: No active cardiopulmonary disease.  Chronic changes at the left lung base.   Electronically Signed   By: Kathreen Devoid   On: 10/24/2014 21:48   Ct Head Wo Contrast  10/11/2014   CLINICAL DATA:  Code stroke. RIGHT-sided facial droop with slurred speech.  EXAM: CT HEAD WITHOUT CONTRAST  TECHNIQUE: Contiguous axial images were obtained from the base of the skull through the vertex without intravenous contrast.  COMPARISON:  CT head 10/12/2012.  FINDINGS: No evidence for acute infarction, hemorrhage, mass lesion, hydrocephalus, or extra-axial fluid. Normal for age cerebral volume. No white matter disease. Calvarium intact. No sinus or mastoid fluid.  IMPRESSION: Negative exam.  No change from priors.  Critical Value/emergent results were called by telephone at the time of interpretation on 10/11/2014 at 1:12 pm to Dr. Janann Colonel , who verbally acknowledged these results.   Electronically Signed   By: Rolla Flatten M.D.   On: 10/11/2014 13:14   Ct Chest W Contrast  10/13/2014   CLINICAL DATA:  69 year old male with unexplained weight loss. Patient with myocardial infarction, CVA, multiple arch O emboli. Acute on chronic kidney disease.  EXAM: CT CHEST, ABDOMEN, AND PELVIS WITH CONTRAST  TECHNIQUE: Multidetector CT imaging of the chest, abdomen and pelvis was performed following the standard protocol during bolus administration of intravenous contrast.  CONTRAST:  85mL  OMNIPAQUE IOHEXOL 300 MG/ML  SOLN  COMPARISON:  06/02/2013 and prior chest CTs  FINDINGS: CT CHEST FINDINGS  Mediastinum/Nodes: Cardiomegaly again noted. Moderate coronary artery calcifications are identified. The ascending aorta measures 3.7 cm and greatest diameter. No enlarged lymph nodes are identified. There is no evidence of pericardial effusion.  Lungs/Pleura: Dependent and bibasilar atelectasis/ scarring again noted. Scarring within the mid and lower left lung again identified. Patchy ground-glass opacities within the left upper lobe may represent aspiration or for pneumonia. There is no evidence of suspicious mass or nodule. COPD/emphysema again identified.  Left posterior pleural calcifications are again identified.  Musculoskeletal: No acute or suspicious abnormalities.  CT ABDOMEN AND PELVIS FINDINGS  Hepatobiliary: The liver and gallbladder are unremarkable. There is no evidence of biliary dilatation.  Pancreas: Unremarkable  Spleen: Unremarkable  Adrenals/Urinary Tract: Moderate bilateral renal atrophy and cortical thinning noted, right greater than left. There is no evidence of hydronephrosis, renal mass or definite renal calculi.  Stomach/Bowel: There is no evidence of bowel obstruction. No definite bowel wall thickening noted. No dilated bowel loops are present. Hip replacement artifact obscures detail within the pelvis.  Vascular/Lymphatic: No enlarged lymph nodes or abdominal aortic aneurysm identified. Calcifications within the IVC, iliac and femoral veins noted.  Reproductive: Prostate not well visualized.  Other: No free fluid, abscess or pneumoperitoneum.  Musculoskeletal: Mild stranding in the left inguinal region is likely postoperative. Bilateral hip replacements identified. No acute bony abnormalities are identified. Degenerative changes in the lumbar spine and bilateral L5 pars defects again noted.  IMPRESSION: No evidence of primary malignancy.  Patchy left upper lobe ground-glass  opacities - possible aspiration versus pneumonia.  Cardiomegaly, coronary artery disease, ectatic ascending thoracic aorta, COPD/emphysema and moderate bilateral renal atrophy.   Electronically Signed   By: Margarette Canada M.D.   On: 10/13/2014 14:10   Ct Abdomen Pelvis W Contrast  10/13/2014   CLINICAL DATA:  69 year old male with unexplained weight loss. Patient with myocardial infarction, CVA, multiple arch O emboli. Acute on chronic kidney disease.  EXAM: CT CHEST, ABDOMEN, AND PELVIS WITH CONTRAST  TECHNIQUE: Multidetector CT imaging of the chest, abdomen and pelvis was performed following the standard protocol during bolus administration of intravenous contrast.  CONTRAST:  48mL OMNIPAQUE IOHEXOL 300 MG/ML  SOLN  COMPARISON:  06/02/2013 and prior chest CTs  FINDINGS: CT CHEST FINDINGS  Mediastinum/Nodes: Cardiomegaly again noted. Moderate coronary artery calcifications are identified. The ascending aorta measures 3.7 cm and greatest diameter. No enlarged lymph nodes are identified. There is no evidence of pericardial effusion.  Lungs/Pleura: Dependent and bibasilar atelectasis/ scarring again noted. Scarring within the mid and lower left lung again identified. Patchy ground-glass opacities within the left upper lobe may represent aspiration or for pneumonia. There is no evidence of suspicious mass or nodule. COPD/emphysema again identified. Left posterior pleural calcifications are again identified.  Musculoskeletal: No acute or suspicious abnormalities.  CT ABDOMEN AND PELVIS FINDINGS  Hepatobiliary: The liver and gallbladder are unremarkable. There is no evidence of biliary dilatation.  Pancreas: Unremarkable  Spleen: Unremarkable  Adrenals/Urinary Tract: Moderate bilateral renal atrophy and cortical thinning noted, right greater than left. There is no evidence of hydronephrosis, renal mass or definite renal calculi.  Stomach/Bowel: There is no evidence of bowel obstruction. No definite bowel wall thickening  noted. No dilated bowel loops are present. Hip replacement artifact obscures detail within the pelvis.  Vascular/Lymphatic: No enlarged lymph nodes or abdominal aortic aneurysm identified. Calcifications within the IVC, iliac and femoral veins noted.  Reproductive: Prostate not well visualized.  Other: No free fluid, abscess or pneumoperitoneum.  Musculoskeletal: Mild stranding in the left inguinal region is likely postoperative. Bilateral hip replacements identified. No acute bony abnormalities are identified. Degenerative changes in the lumbar spine and bilateral L5 pars defects again noted.  IMPRESSION: No evidence of primary malignancy.  Patchy left upper lobe ground-glass opacities - possible aspiration versus pneumonia.  Cardiomegaly, coronary artery disease, ectatic ascending thoracic aorta, COPD/emphysema and moderate bilateral renal atrophy.   Electronically Signed   By: Margarette Canada M.D.   On: 10/13/2014  14:10   Dg Chest Port 1 View  10/11/2014   CLINICAL DATA:  Shortness of breath.  EXAM: PORTABLE CHEST - 1 VIEW  COMPARISON:  10/11/2014 and 01/17/2014  FINDINGS: Right-sided pacemaker unchanged. Lungs are adequately inflated with stable chronic elevation of the left hemidiaphragm and stable chronic changes over the left lung. Known calcified pleural plaques. No focal lobar consolidation or effusion cardiomediastinal silhouette and remainder of the exam is unchanged.  IMPRESSION: No active disease.  Chronic stable changes of the left lung. Evidence of asbestos related pleural disease.   Electronically Signed   By: Marin Olp M.D.   On: 10/11/2014 21:56   Dg Chest Portable 1 View  10/11/2014   CLINICAL DATA:  Right facial droop  EXAM: PORTABLE CHEST - 1 VIEW  COMPARISON:  01/17/2014  FINDINGS: Cardiac shadow is again mildly enlarged. A pacing device is again seen. Chronic left pleural effusion and left basilar changes are noted although they have increased in the interval from the prior exam. The  right lung remains clear. The visualized upper abdomen is unremarkable.  IMPRESSION: Increasing chronic changes in the left lung base when compare with the prior exam. Calcified pleural plaques are again noted.   Electronically Signed   By: Inez Catalina M.D.   On: 10/11/2014 14:06   Dg Ang/ext/uni/or Left  10/12/2014   CLINICAL DATA:  Femoral thrombectomy for embolic occlusion in the left lower extremity.  EXAM: LEFT ANG/EXT/UNI/ OR  COMPARISON:  None.  FINDINGS: Intraoperative image shows a normally patent popliteal artery and normally patent visualized anterior tibial, peroneal and posterior tibial arteries to nearly the level of the ankle. No filling defects are identified.  IMPRESSION: No distal occlusions or filling defects identified at the level of the popliteal and tibial arteries.   Electronically Signed   By: Aletta Edouard M.D.   On: 10/12/2014 14:02     Admission HPI: Original Author Julious Oka MD  Pt is a 25 M w/ PMHx of HTN, CHF, depression, portal vein thrombosis, NI cardiomyopathy, tachy-brady syndrome now w/ ICD, recurrent DVT, COPD, and afib who presents with DOE and dizziness. Pt was recently admitted on 5/24-6/1 for persistent atrial fibrillation with RVR that was complicated by multiple emboli w/ resulting STEMI, TIA, and acute left LE ischemia s/p embolectomy due to non compliance w/ anticoagulation therapy. Since discharge pt has felt dizzy and SOB w/ ambulation. He gets dizzy when he sits up and while walking. He states he feels unsteady and feels like his legs are about to give out. He previously could walk to the the door of the hospital room but now cannot w/o getting SOB. He has been using his albuterol inhaler more and states it takes a while before it helps his SOB. Along w/ DOE and gets chest palpitations. He is also complaining of a constant headache located around his head that is worsened w/ exertion. SOB and dizziness resolve w/ laying still. Pt states he had some  confusion on his admission last month which has worsening. He will forget where he is at sometimes. He has been worked up in the past for weight loss and pt states he had a meal 2 days ago on Saturday and then the next meal he had was morning of admission which was a tuna sandwich that he ate half of. He is also drinking nutritional supplements w/ meals. He has a home health nurse who arranges his medications in a pillbox and states he has been compliant with meds.  Documented digoxin toxicity in 2009. Unable to find this per chart review. He was last on digoxin 0.125mg  daily up until 09/09/2008 when it was d/c by his cardiologist Dr. Rayann Heman since he was maintained in sinus rhythm with amiodarone. During his admission last month he was restarted on digoxin and pt last took it this morning around 10:00am.   Hospital Course by problem list:   Symptomatic Persistent Afib with RVR  - Pt with recent hospitalization from 5/24-6/1 for persistent Afib with RVR complicated by multiple emboli with resulting STEMI, TIA, and acute left LE ischemia s/p embolectomy in setting of non-adherence with [facility] therapy. He did not have left heart catherization at last admission and 2D-echo revealed EF 30-35% with diffuse hypokinesis. He was instructed on discharge to take lopressor 75 mg TID and digoxin 0.0625 mg daily which he reported compliance with. He presented with lightheadedness with standing with afib with RVR which improved during hospitalization. Per cardiology recommendations his lopressor 75 mg TID was changed to toprol-XL 200 mg daily and continued on digoxin 0.0625 mg daily (level was low at 0.4 however has had toxicity in the past). CCB should be avoided in setting of reduced EF. Pt was continued on coumadin per pharmacy (7.5 mg daily) with therapeutic INR of 2.2 on day of discharge with goal INR of 2-3. CHADS2 score of 4 requiring life-long AC therapy.His aspirin 81 mg daily was continued due recent STEMI. He is to  have outpatient nuclear medicine cardiac stress testing and repeat 2D-echo in 3 months. He was scheduled for close cardiology follow-up with Dr. Rayann Heman.   Hypertension - Pt with blood pressure range of 98/70-152/95 during hospitalization. Pt initially received lopressor 75 mg TID which was changed to toprol-XL 200 mg daily. Pt's ramipril was discontinued due to soft blood pressures, but can be restarted if blood pressure tolerates (pt with recent STEMI). CCB should be avoided in setting of reduced EF.   Severe Malnutrition - Pt with BMI of 19.15 with reported 24lb weight loss over past 5 months. He was not able to care for himself at home and was only eating few meals a day.Pt received nutritional supplementation during hospitalization and encouraged to increase dietary intake.    Chronic Systolic CHF with ICD - Pt with no exacerbation during hospitalization. 2D-echo revealed EF 30-35% with diffuse hypokinesis in setting of STEMI. Prior echo on 12/16/13 revealed EF 45-50%. Pt initially received lopressor 75 mg TID which was changed to toprol-XL 200 mg daily. His ramipril was discontinued due to soft blood pressures. Pt needs repeat 2D-echo in 3 months.   CKD Stage 3 - Pt with stable renal function during hospitalization with creatine below baseline Cr of 1.6 and normal urine output. Nephrotoxins were avoided during hospitalization and was instructed to discontinue home naproxen use.  COPD - Pt with no exacerbation during hospitalization. He was continued on home spiriva and albuterol as needed.   Hyperlipidemia - Last lipid panel on 10/12/14 was normal with LDL at goal <100. Pt was continued on rosuvastatin 20 mg daily in setting of recent STEMI.   Depression - Pt with stable mood during hospitalization and received wellbutrin 75 mg BID   GERD - Pt received protonix during hospitalization without acid reflux symptoms during hospitalization.   DVT Prophylaxis - Pt received coumadin during  hospitalization with no evidence of DVT.     Discharge Vitals:   BP 98/70 mmHg  Pulse 106  Temp(Src) 98.2 F (36.8 C) (Oral)  Resp 18  Ht  6\' 3"  (1.905 m)  Wt 147 lb 4.8 oz (66.815 kg)  BMI 18.41 kg/m2  SpO2 98%  Discharge Labs:  Results for orders placed or performed during the hospital encounter of 10/24/14 (from the past 24 hour(s))  Protime-INR     Status: Abnormal   Collection Time: 10/27/14  5:51 AM  Result Value Ref Range   Prothrombin Time 24.2 (H) 11.6 - 15.2 seconds   INR 2.20 (H) 0.00 - 1.49   *Note: Due to a large number of results and/or encounters for the requested time period, some results have not been displayed. A complete set of results can be found in Results Review.    Signed: Juluis Mire, MD 10/27/2014, 12:03 PM    Services Ordered on Discharge: SNF Equipment Ordered on Discharge: None

## 2014-10-27 NOTE — Progress Notes (Signed)
Patient seen and examined. Case d/w residents in detail. I agree with findings and plan as documented in Dr. Harley Hallmark note.  HR is better controlled now. Patient symptoms are much improved. Will d/c to SNF today. Cardio f/u appreciated. Will start toprol xl 200 mg (converting from toprol 75 mg tid). Avoid CCB in setting of EF<30%. C/w digoxin and coumadin. Patient is not a candidate for AV ablation

## 2014-10-27 NOTE — Progress Notes (Signed)
ANTICOAGULATION CONSULT NOTE - Follow-Up  Pharmacy Consult for coumadin Indication: atrial fibrillation  Allergies  Allergen Reactions  . Digoxin And Related Other (See Comments)    Dig toxicity 2009  . Penicillins Rash    Patient Measurements: Height: '6\' 3"'  (190.5 cm) Weight: 147 lb 4.8 oz (66.815 kg) IBW/kg (Calculated) : 84.5  Vital Signs: Temp: 98.2 F (36.8 C) (06/09 0540) Temp Source: Oral (06/09 0540) BP: 98/77 mmHg (06/09 0540) Pulse Rate: 84 (06/09 0540)  Labs:  Recent Labs  10/24/14 2117 10/25/14 0527 10/26/14 0700 10/27/14 0551  HGB 12.6*  --  12.1*  --   HCT 38.3*  --  37.1*  --   PLT 320  --  311  --   LABPROT  --  21.7* 23.1* 24.2*  INR  --  1.89* 2.06* 2.20*  CREATININE 1.48*  --  1.28*  --   CKTOTAL  --   --  30*  --     Estimated Creatinine Clearance: 50.7 mL/min (by C-G formula based on Cr of 1.28).   Medical History: Past Medical History  Diagnosis Date  . Hypertension   . Chronic systolic heart failure     a. NICM - EF 35% with normal cors 2003. b. Last echo 11/2012 - EF 25-30%.  . Depression   . GERD (gastroesophageal reflux disease)   . Portal vein thrombosis   . Avascular necrosis of bones of both hips     Status post bilateral hip replacements  . Gastritis   . Alcohol abuse   . Erectile dysfunction   . Bipolar disorder   . Hydrocele, unspecified   . Intermittent claudication   . Lung nodule     Chest CT scan on 12/14/2008 showed a nodular opacity at the left lung base felt to most likely represent scarring.  Follow-up chest CT scan on 06/02/2010 showed parenchymal scarring in the left apex, left  lower lobe, and lingula; some of this scarring at the left base had a nodular appearance, unchanged. Chest 09/2012 - stable.  . 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  Thyroid scan 08/28/2009 was normal with no focal areas of abnormal increased or decreased activity seen; the uptake of I 131  sodium iodide at 24 hours was 5.7%.  TSH and free T4 normalized by 08/16/2009.  . Intestinal obstruction   . COPD (chronic obstructive pulmonary disease)   . Clostridium difficile colitis   . E coli bacteremia   . DVT (deep venous thrombosis)     He has hypercoagulability with multiple prior DVTs  . Pleural plaque     H/o asbestos exposure. Chest CT on 06/02/2010 showed stable extensive calcified pleural plaques involving the left hemithorax, consistent with asbestos related pleural disease.  . Weight loss     Normal colonoscopy by Dr. Olevia Perches on 11/06/2010.  . Tachycardia-bradycardia syndrome     a. 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 b. [employer] gen change 2010.  [name] PAF (paroxysmal atrial fibrillation)     Paroxysmal, failed medical therapy with amiodarone but has done very well with multaq;  d/c 2/2 to recurrent AFib;  Tikosyn load c/b Hyperkalemia during admx 05/2013 => rate control strategy  . 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  . Noncompliance   . Thrombocytopenia   . Osteoarthritis   . Spondylosis   . Anxiety   . CKD (chronic kidney  disease)     Renal U/S 12/04/2009 showed no pathological findings. Labs 12/04/2009 include normal ESR, C3, C4; neg ANA; SPEP showed nonspecific increase in the alpha-2 region with no M-spike; UPEP showed no monoclonal free light chains; urine IFE showed polyclonal increase in feree Kappa and/or free Lambda light chains. Baseline Cr reported 1.7-2.5.  . STEMI (ST elevation myocardial infarction) 10/11/2014  . Hyperlipemia   . CHF (congestive heart failure)   . On home oxygen therapy     "3L prn" (10/24/2014)    Medications:  Scheduled:  . aspirin EC  81 mg Oral Daily  . buPROPion  75 mg Oral BID  . digoxin  0.0625 mg Oral Daily  . feeding supplement (ENSURE ENLIVE)  237 mL Oral BID BM  . metoprolol tartrate  75 mg Oral TID  . multivitamin with  minerals  1 tablet Oral Daily  . pantoprazole  80 mg Oral Daily  . ramipril  2.5 mg Oral Daily  . rosuvastatin  20 mg Oral q1800  . sodium chloride  3 mL Intravenous Q12H  . tiotropium  18 mcg Inhalation Daily  . warfarin  7.5 mg Oral ONCE-1800  . Warfarin - Pharmacist Dosing Inpatient   Does not apply q1800    Assessment: 69 yo male here with SOB and dizziness. He is on coumadin  PTA for afib and pharmacy has been consulted to dose. INR today= 2.2  Home coumadin dose: 7.13m daily except 564mTTSa (last dose taken 10/23/14)  Patient is now therapeutic with increased dose of coumadin.  Goal of Therapy:  INR 2-3 Monitor platelets by anticoagulation protocol: Yes   Plan:  -Warfarin 7.5 mg x 1 tonight -Daily PT/INR -Monitor s/sx of bleeding  MiLevester FreshPharmD, BCPS Clinical Pharmacist Pager [phone]/01/2015 10:49 AM

## 2014-10-27 NOTE — Progress Notes (Signed)
    Subjective:  When getting out of bed last night had some dyspnea and elevated HR. Stable now laying down.   Objective:  Vital Signs in the last 24 hours: Temp:  [98.2 F (36.8 C)-98.6 F (37 C)] 98.2 F (36.8 C) (06/09 0540) Pulse Rate:  [83-94] 84 (06/09 0540) Resp:  [16-22] 18 (06/09 0540) BP: (98-122)/(77-80) 98/77 mmHg (06/09 0540) SpO2:  [97 %-100 %] 98 % (06/09 0839)  Intake/Output from previous day: 06/08 0701 - 06/09 0700 In: 460 [P.O.:460] Out: 1535 [Urine:1535]   Physical Exam: General: Thin, in no acute distress. Head:  Normocephalic and atraumatic. Lungs: Clear to auscultation and percussion. Heart: Irreg irreg mildly tachy.  No murmur, rubs or gallops.  Abdomen: soft, non-tender, positive bowel sounds. Extremities: No clubbing or cyanosis. No edema. Neurologic: Alert and oriented x 3.    Lab Results:  Recent Labs  10/24/14 2117 10/26/14 0700  WBC 9.1 7.1  HGB 12.6* 12.1*  PLT 320 311    Recent Labs  10/24/14 2117 10/26/14 0700  NA 143 137  K 4.6 4.4  CL 110 105  CO2 24 24  GLUCOSE 86 72  BUN 34* 28*  CREATININE 1.48* 1.28*   No results for input(s): TROPONINI in the last 72 hours.  Invalid input(s): CK, MB Hepatic Function Panel  Recent Labs  10/26/14 0700  PROT 5.7*  ALBUMIN 2.7*  AST 37  ALT 48  ALKPHOS 49  BILITOT 0.7     Telemetry: AFIB 90-110 Personally viewed.  Cardiac Studies:  EF 35%  Assessment/Plan:  Active Problems:   Essential hypertension   BRADYCARDIA-TACHYCARDIA SYNDROME   COPD (chronic obstructive pulmonary disease)   Chronic kidney disease   Chronic combined systolic and diastolic congestive heart failure   Weight loss   Lightheadedness   Non-ischemic cardiomyopathy   Severe malnutrition   Atrial fibrillation with RVR   Dyspnea   Protein-calorie malnutrition, severe  AFIB with RVR  - continue current meds. Agree that consolidating Bb would be helpful. Will Rx Toprol 200mg  PO QD (on 225mg  total  now, 75 TID). BP mild low at times.  - avoiding diltiazem with EF 35% - Will stop ASA since on coumadin - Cont Dig low dose despite low efficacy in rate control.  - OK with overall rate <110.   Cardiomyopathy  - If BP can not tolerate, may need to stop ACE-I. Would rather have higher doses of Bb to improve rate.   Chronic anticoaguation  - warfarin  OK from cardiac standpoint for DC to SNF    Harsh Trulock 10/27/2014, 11:11 AM

## 2014-10-27 NOTE — Patient Outreach (Signed)
[facility]) Care Management  10/27/2014  [name] [birth year] [id number]   CSW made a second attempt to try and contact patient today to perform the initial phone assessment, as well as assess and assist with social work needs and services; however, patient was unavailable at the time of CSW's call.   A HIPAA compliant message was left on voicemail.  CSW is currently awaiting a return call. Shortly thereafter, CSW noted that patient is still inpatient at [facility], where patient has been residing since June 6th, after CSW performed a thorough review of patient's EMR (Electronic Medical Record) in EP[facility].  CSW made contact with the [place] Worker, Danelle Earthly, who is currently involved with patient's care.  According to Ms. Scinto, patient is being discharged to [facility] today for short-term rehabilitative services, prior to returning home to live with son.  CSW will continue to follow to assist with possible discharge planning needs [facility].  [name], BSW, MSW, [facility] [place], [address], [place] [address] [name] [phone]

## 2014-10-27 NOTE — Progress Notes (Addendum)
Subjective:  Pt seen and examined in AM. No acute events overnight. He states he feels better with improved lightheadedness with standing and denies dyspnea, palpitations, or chest pain.      Objective: Vital signs in last 24 hours: Filed Vitals:   10/26/14 1000 10/26/14 1416 10/26/14 2146 10/27/14 0540  BP:  110/80 122/79 98/77  Pulse: 70 83 94 84  Temp:  98.6 F (37 C) 98.2 F (36.8 C) 98.2 F (36.8 C)  TempSrc:  Oral Oral Oral  Resp:  16 22 18   Height:      Weight:      SpO2:  97% 100% 100%   Weight change:   Intake/Output Summary (Last 24 hours) at 10/27/14 0724 Last data filed at 10/27/14 0544  Gross per 24 hour  Intake    460 ml  Output   1435 ml  Net   -975 ml   PHYSICAL EXAMINATION: General: Thin appearing, NAD Heart: Tachycardic, irregularly irregular rhythm  Lungs: Increased BS, no ronchi, wheezing, or rales Abdomen: Soft, non-tender, non-distended, normal BS Extremities: No edema   Lab Results: Basic Metabolic Panel:  Recent Labs Lab 10/24/14 2117 10/26/14 0700  NA 143 137  K 4.6 4.4  CL 110 105  CO2 24 24  GLUCOSE 86 72  BUN 34* 28*  CREATININE 1.48* 1.28*  CALCIUM 8.9 8.5*  MG 2.0  --    Liver Function Tests:  Recent Labs Lab 10/24/14 2117 10/26/14 0700  AST 74* 37  ALT 68* 48  ALKPHOS 56 49  BILITOT 0.7 0.7  PROT 6.4* 5.7*  ALBUMIN 3.0* 2.7*   No results for input(s): LIPASE, AMYLASE in the last 168 hours. No results for input(s): AMMONIA in the last 168 hours. CBC:  Recent Labs Lab 10/24/14 2117 10/26/14 0700  WBC 9.1 7.1  NEUTROABS 5.0  --   HGB 12.6* 12.1*  HCT 38.3* 37.1*  MCV 91.0 90.9  PLT 320 311   CBG:  Recent Labs Lab 10/24/14 2157  GLUCAP 85  Coagulation:  Recent Labs Lab 10/24/14 1844 10/25/14 0527 10/26/14 0700 10/27/14 0551  LABPROT  --  21.7* 23.1* 24.2*  INR 2.10 1.89* 2.06* 2.20*   Urine Drug Screen: Drugs of Abuse     Component Value Date/Time   LABOPIA NONE DETECTED  10/11/2014 1724   LABOPIA NEGATIVE 12/27/2008 1943   COCAINSCRNUR NONE DETECTED 10/11/2014 1724   COCAINSCRNUR NEG 07/06/2009 2225   LABBENZ NONE DETECTED 10/11/2014 1724   LABBENZ NEG 07/06/2009 2225   AMPHETMU NONE DETECTED 10/11/2014 1724   AMPHETMU NEG 07/06/2009 2225   THCU POSITIVE* 10/11/2014 1724   LABBARB NONE DETECTED 10/11/2014 1724     Studies/Results: No results found. Medications: I have reviewed the patient's current medications. Scheduled Meds: . aspirin EC  81 mg Oral Daily  . buPROPion  75 mg Oral BID  . digoxin  0.0625 mg Oral Daily  . feeding supplement (ENSURE ENLIVE)  237 mL Oral BID BM  . metoprolol tartrate  75 mg Oral TID  . multivitamin with minerals  1 tablet Oral Daily  . pantoprazole  80 mg Oral Daily  . ramipril  2.5 mg Oral Daily  . rosuvastatin  20 mg Oral q1800  . sodium chloride  3 mL Intravenous Q12H  . tiotropium  18 mcg Inhalation Daily  . Warfarin - Pharmacist Dosing Inpatient   Does not apply q1800   Continuous Infusions:  PRN Meds:.albuterol, ondansetron, oxyCODONE-acetaminophen **AND** oxyCODONE Assessment/Plan:   Persistent Afib with  RVR complicated by thromboembolism - Current HR 106 with irregularly irregular rhythm. INR therapeutic at 2.20. Pt with CHADS2 score of 4 requiring life-long AC therapy.  -Appreciate cardiology recommendations  -Monitor on telemetry  -Continue coumadin per pharmacy with goal INR 2-3,  -Continue aspirin 81 mg daily in setting of recent STEMI -Continue digoxin 0.0625 mg daily, dig level low at 0.4, however has had toxicity in the past -Change metoprolol 75 mg TID to toprol-xL 200 mg daily  -Avoid CCB in setting of reduced EF  -Per Dr. Rayann Heman, pt not candidate for AVN ablation  -Will need outpatient NM stress testing and repeat 2D-echo in 3 months -PT and OT consults recommend SNF, pt stable for discharge to SNF today  Hypertension - Currently with soft blood pressures  -Change metoprolol 75 mg  TID to toprol-xL 200 mg daily  -Discontinue ramipril 2.5 mg daily in setting of soft blood pressures   Chronic Systolic CHF - Currently euvolemic.  -Change metoprolol 75 mg TID to toprol-xL 200 mg daily  -Discontinue ramipril 2.5 mg daily in setting of soft blood pressures  -Will need repeat 2D-echo in 3 months  Severe Malnutrition -BMI 19 with 24 lb weight loss over last 5 months.  -Appreciate RD recommendations -Encourage dietary intake  -Continue ensure 237 mL TID between meals   CKD Stage 3 - Cr 1.28 below baseline Cr of 1.6 with normal urine output. -Monitor daily weights and strict I & O's -Avoid nephrotoxins   COPD - Currently with no exacerbation.  -Continue spiriva and albuterol nebulizer Q 6 hr PRN   Hyperlipidemia - Last lipid panel on 10/12/14 was normal.  CK level low at 30.  -Continue rosuvastatin 20 mg daily  -Monitor for myalgias  BPH - Currently stable. -Hold home flomax 0.4 mg daily  -Monitor for orthostatic hypotension   Depression - Currently with stable mood -Continue wellbutrin 75 mg BID   GERD - Currently without acid reflux symptoms.  -Continue protonix 40 mg daily    Diet: Regular DVT Ppx: Coumadin  Code: Full     Dispo: Today to SNF   The patient does have a current PCP Bertha Stakes, MD) and does need an [facility] follow-up appointment after discharge.  The patient does have transportation limitations that hinder transportation to clinic appointments.  .Services Needed at time of discharge: Y = Yes, Blank = No PT:   OT:   RN:   Equipment:   Other:  SNF    LOS: 3 days   Juluis Mire, MD 10/27/2014, 7:24 AM

## 2014-10-27 NOTE — Clinical Social Work Note (Signed)
Clinical Social Worker facilitated patient discharge including contacting patient and facility to confirm patient discharge plans.  Clinical information faxed to facility and patient agreeable with plan.  Patient to notify daughter over the phone of discharge plans.  Insurance authorization number received [id number]).  CSW arranged ambulance transport via [facility] to [facility].  RN to call report prior to discharge.  Clinical Social Worker will sign off for now as social work intervention is no longer needed. Please consult [place] again if new need arises.  Barbette Or, [place]

## 2014-10-28 ENCOUNTER — Ambulatory Visit: Payer: Commercial Managed Care - HMO | Admitting: *Deleted

## 2014-10-28 ENCOUNTER — Non-Acute Institutional Stay (SKILLED_NURSING_FACILITY): Payer: Commercial Managed Care - HMO | Admitting: Adult Health

## 2014-10-28 ENCOUNTER — Encounter: Payer: Self-pay | Admitting: Adult Health

## 2014-10-28 ENCOUNTER — Ambulatory Visit: Payer: Commercial Managed Care - HMO | Admitting: Internal Medicine

## 2014-10-28 DIAGNOSIS — J449 Chronic obstructive pulmonary disease, unspecified: Secondary | ICD-10-CM

## 2014-10-28 DIAGNOSIS — K219 Gastro-esophageal reflux disease without esophagitis: Secondary | ICD-10-CM

## 2014-10-28 DIAGNOSIS — F329 Major depressive disorder, single episode, unspecified: Secondary | ICD-10-CM

## 2014-10-28 DIAGNOSIS — Z7901 Long term (current) use of anticoagulants: Secondary | ICD-10-CM | POA: Diagnosis not present

## 2014-10-28 DIAGNOSIS — G4700 Insomnia, unspecified: Secondary | ICD-10-CM | POA: Diagnosis not present

## 2014-10-28 DIAGNOSIS — I4891 Unspecified atrial fibrillation: Secondary | ICD-10-CM

## 2014-10-28 DIAGNOSIS — E43 Unspecified severe protein-calorie malnutrition: Secondary | ICD-10-CM

## 2014-10-28 DIAGNOSIS — R5381 Other malaise: Secondary | ICD-10-CM

## 2014-10-28 DIAGNOSIS — N183 Chronic kidney disease, stage 3 unspecified: Secondary | ICD-10-CM

## 2014-10-28 DIAGNOSIS — E785 Hyperlipidemia, unspecified: Secondary | ICD-10-CM

## 2014-10-28 DIAGNOSIS — F32A Depression, unspecified: Secondary | ICD-10-CM

## 2014-10-31 ENCOUNTER — Ambulatory Visit: Payer: Commercial Managed Care - HMO

## 2014-11-01 ENCOUNTER — Encounter: Payer: Self-pay | Admitting: Internal Medicine

## 2014-11-01 ENCOUNTER — Non-Acute Institutional Stay (SKILLED_NURSING_FACILITY): Payer: Commercial Managed Care - HMO | Admitting: Internal Medicine

## 2014-11-01 DIAGNOSIS — F329 Major depressive disorder, single episode, unspecified: Secondary | ICD-10-CM

## 2014-11-01 DIAGNOSIS — N183 Chronic kidney disease, stage 3 unspecified: Secondary | ICD-10-CM

## 2014-11-01 DIAGNOSIS — I4891 Unspecified atrial fibrillation: Secondary | ICD-10-CM | POA: Diagnosis not present

## 2014-11-01 DIAGNOSIS — R5381 Other malaise: Secondary | ICD-10-CM | POA: Diagnosis not present

## 2014-11-01 DIAGNOSIS — L538 Other specified erythematous conditions: Secondary | ICD-10-CM

## 2014-11-01 DIAGNOSIS — E785 Hyperlipidemia, unspecified: Secondary | ICD-10-CM

## 2014-11-01 DIAGNOSIS — K219 Gastro-esophageal reflux disease without esophagitis: Secondary | ICD-10-CM | POA: Diagnosis not present

## 2014-11-01 DIAGNOSIS — D638 Anemia in other chronic diseases classified elsewhere: Secondary | ICD-10-CM | POA: Diagnosis not present

## 2014-11-01 DIAGNOSIS — I5042 Chronic combined systolic (congestive) and diastolic (congestive) heart failure: Secondary | ICD-10-CM | POA: Diagnosis not present

## 2014-11-01 DIAGNOSIS — G4700 Insomnia, unspecified: Secondary | ICD-10-CM

## 2014-11-01 DIAGNOSIS — F32A Depression, unspecified: Secondary | ICD-10-CM

## 2014-11-01 DIAGNOSIS — E43 Unspecified severe protein-calorie malnutrition: Secondary | ICD-10-CM | POA: Diagnosis not present

## 2014-11-01 DIAGNOSIS — R42 Dizziness and giddiness: Secondary | ICD-10-CM

## 2014-11-01 DIAGNOSIS — J449 Chronic obstructive pulmonary disease, unspecified: Secondary | ICD-10-CM | POA: Diagnosis not present

## 2014-11-01 NOTE — Progress Notes (Signed)
Patient ID: [name], male   DOB: 1945-07-17, 69 y.o.   MRN: [id number]     [place]  PCP: Axel Filler, MD  Code Status: Full Code   Allergies  Allergen Reactions  . Digoxin And Related Other (See Comments)    Dig toxicity 2009  . Penicillins Rash    Chief Complaint  Patient presents with  . New Admit To SNF    New Admission      HPI:  69 year old patient is here for short term rehabilitation post hospital admission from 10/24/14-10/27/14 with afib with RVR. His lopressor 75 mg bid was changed to toprol xl 200 mg daily. His ramipril was discontinued with low bp reading. He is here for rehabilitation. He is seen in his room today. He mentions not having much of appetite. He also mentions not getting enough portion of meals. He has a rash on his left arm and upper back for 4 months. This has been itching and bothering him. He has ben seen by dermatology as outpatient- no record for review. He feels his strength is coming back. He also has some dizziness with sudden change of position. No other concerns. He has PMH of HTN, protein malnutrition, chronic systolic chf, s/p ICD, ckd stage 3, copd, HLD, GERD, depression among others.   Review of Systems:  Constitutional: Negative for fever, chills, diaphoresis.  HENT: Negative for headache, congestion, nasal discharge Eyes: Negative for eye pain, blurred vision, double vision and discharge.  Respiratory: positive for dyspnea with exertion. Negative for cough, shortness of breath at rest and wheezing.   Cardiovascular: Negative for chest pain, palpitations, leg swelling.  Gastrointestinal: Negative for heartburn, nausea, vomiting, abdominal pain. Last bowel movement Saturday. Genitourinary: Negative for dysuria Musculoskeletal: Negative for back pain, falls Skin: Negative for itching, rash.  Neurological: Negative for tingling, focal weakness Psychiatric/Behavioral: Negative for depression   Past Medical  History  Diagnosis Date  . Hypertension   . Chronic systolic heart failure     a. NICM - EF 35% with normal cors 2003. b. Last echo 11/2012 - EF 25-30%.  . Depression   . GERD (gastroesophageal reflux disease)   . Portal vein thrombosis   . Avascular necrosis of bones of both hips     Status post bilateral hip replacements  . Gastritis   . Alcohol abuse   . Erectile dysfunction   . Bipolar disorder   . Hydrocele, unspecified   . Intermittent claudication   . Lung nodule     Chest CT scan on 12/14/2008 showed a nodular opacity at the left lung base felt to most likely represent scarring.  Follow-up chest CT scan on 06/02/2010 showed parenchymal scarring in the left apex, left  lower lobe, and lingula; some of this scarring at the left base had a nodular appearance, unchanged. Chest 09/2012 - stable.  . 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  Thyroid scan 08/28/2009 was normal with no focal areas of abnormal increased or decreased activity seen; the uptake of I 131 sodium iodide at 24 hours was 5.7%.  TSH and free T4 normalized by 08/16/2009.  . Intestinal obstruction   . COPD (chronic obstructive pulmonary disease)   . Clostridium difficile colitis   . E coli bacteremia   . DVT (deep venous thrombosis)     He has hypercoagulability with multiple prior DVTs  . Pleural plaque     H/o asbestos exposure. Chest CT  on 06/02/2010 showed stable extensive calcified pleural plaques involving the left hemithorax, consistent with asbestos related pleural disease.  . Weight loss     Normal colonoscopy by Dr. Olevia Perches on 11/06/2010.  . Tachycardia-bradycardia syndrome     a. 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 b. St. Jude gen change 2010.  [name] PAF (paroxysmal atrial fibrillation)     Paroxysmal, failed medical therapy with amiodarone but has done very well with multaq;  d/c 2/2 to recurrent AFib;  Tikosyn load c/b Hyperkalemia during admx 05/2013 => rate control strategy  .  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  . Noncompliance   . Thrombocytopenia   . Osteoarthritis   . Spondylosis   . Anxiety   . CKD (chronic kidney disease)     Renal U/S 12/04/2009 showed no pathological findings. Labs 12/04/2009 include normal ESR, C3, C4; neg ANA; SPEP showed nonspecific increase in the alpha-2 region with no M-spike; UPEP showed no monoclonal free light chains; urine IFE showed polyclonal increase in feree Kappa and/or free Lambda light chains. Baseline Cr reported 1.7-2.5.  . STEMI (ST elevation myocardial infarction) 10/11/2014  . Hyperlipemia   . CHF (congestive heart failure)   . On home oxygen therapy     "3L prn" (10/24/2014)   Past Surgical History  Procedure Laterality Date  . Total hip arthroplasty Right 03/08/2008     By Dr. Hiram Comber  . Tee without cardioversion N/A 05/27/2013    Procedure: TRANSESOPHAGEAL ECHOCARDIOGRAM (TEE);  Surgeon: Candee Furbish, MD;  Location[facility] ENDOSCOPY;  Service: Cardiovascular;  Laterality: N/A;  . Cardioversion N/A 05/27/2013    Procedure: CARDIOVERSION;  Surgeon: Candee Furbish, MD;  Location: [facility] ENDOSCOPY;  Service: Cardiovascular;  Laterality: N/A;  . Cardiac catheterization  2003    Nl cors, EF 35%  . Embolectomy Right 12/10/2013    Procedure: Right Femoral Embolectomy; Fasciotomy Right Lower Leg with Intraoperative arteriogram;  Surgeon: Rosetta Posner, MD;  Location: [place];  Service: Vascular;  Laterality: Right;  . Intraoperative arteriogram Right 12/10/2013    Procedure: INTRA OPERATIVE ARTERIOGRAM;  Surgeon: Rosetta Posner, MD;  Location: [place];  Service: Vascular;  Laterality: Right;  . Fasciotomy closure Right 12/15/2013    Procedure: LEG FASCIOTOMY CLOSURE;  Surgeon: Rosetta Posner, MD;  Location: [facility] OR;  Service: Vascular;  Laterality: Right;  . Embolectomy Left 10/12/2014    Procedure: Left Femoral Thrombectomy with  intraoperative arteriogram;  Surgeon: Rosetta Posner, MD;  Location: [facility] OR;  Service: Vascular;  Laterality: Left;  . Hemiarthroplasty hip Left 09/09/2002    bipolar; Dr. Lafayette Dragon  . Joint replacement    . Insert / replace / remove pacemaker  02/2003    Dr. Roe Rutherford, [place] pulse generator identity SR model [id number] serial [id number]; gen change by Greggory Brandy  . Insert / replace / remove pacemaker  06/17/2008    Lead and generator change w/ St. Jude Medical Tendril ST model [phone] [id number] (serial  [id number]).        Social History:   reports that he has been smoking Cigarettes.  He has a 17.82 pack-year smoking history. He has never used smokeless tobacco. He reports that he drinks alcohol. He reports that he uses illicit drugs (Marijuana) about once per week.  Family History  Problem Relation Age of Onset  . Hypertension Mother   . Cancer Brother     Marena Chancy of  type.  . Asthma Mother   . Coronary artery disease Mother   . Colon cancer Neg Hx   . Lung cancer Neg Hx   . Prostate cancer Neg Hx   . Stroke      mother  . Heart attack      brother and sister ?age     Medications: Patient's Medications  New Prescriptions   No medications on file  Previous Medications   ALBUTEROL (VENTOLIN HFA) 108 (90 BASE) MCG/ACT INHALER    Inhale 2 puffs into the lungs every 6 (six) hours as needed for wheezing or shortness of breath.   AMBULATORY NON FORMULARY MEDICATION    Medication Name: Med Pass 120 mL by mouth daily   ASPIRIN EC 81 MG EC TABLET    Take 1 tablet (81 mg total) by mouth daily.   BUPROPION (WELLBUTRIN) 75 MG TABLET    Take 1 tablet (75 mg total) by mouth 2 (two) times daily.   DIGOXIN 62.5 MCG TABS    Take 0.0625 mg by mouth daily.   MELATONIN 3 MG CAPS    Take by mouth at bedtime. For Insomnia   METOPROLOL (TOPROL-XL) 200 MG 24 HR TABLET    Take 1 tablet (200 mg total) by mouth daily. Take with or immediately following a meal.   OMEPRAZOLE (PRILOSEC) 40 MG CAPSULE    Take 40 mg  by mouth daily.   OXYCODONE-ACETAMINOPHEN (PERCOCET) 10-325 MG PER TABLET    Take 1 tablet by mouth every 4 (four) hours as needed for pain. Do not take more than 5 doses in a 24 hour period.   ROSUVASTATIN (CRESTOR) 20 MG TABLET    Take 1 tablet (20 mg total) by mouth daily at 6 PM.   SENNA-DOCUSATE ([employer] SENNA PLUS) 8.6-50 MG PER TABLET    Take 1 tablet by mouth daily as needed (for constipation).   TIOTROPIUM (SPIRIVA) 18 MCG INHALATION CAPSULE    Place 1 capsule (18 mcg total) into inhaler and inhale daily.  Modified Medications   Modified Medication Previous Medication   WARFARIN (COUMADIN) 2.5 MG TABLET warfarin (COUMADIN) 2.5 MG tablet      Take 3 tablets (7.5 mg total) by mouth See admin instructions.    Take 3 tablets (7.5 mg total) by mouth See admin instructions. Pt takes $Remove'5mg'MJDuaOm$  every other day - alternates w/ $RemoveBefor'7mg'QgzzFQkxtcmt$  every other day  Discontinued Medications   BETAMETHASONE DIPROPIONATE (DIPROLENE) 0.05 % CREAM    Apply to affected areas on back and arm once daily for 2 weeks.  Do not use on face or in skin folds.   NUTRITIONAL SUPPLEMENTS (ENSURE PO)    Take 1 Container by mouth daily.   OMEPRAZOLE (PRILOSEC) 20 MG CAPSULE    Take 2 capsules (40 mg total) by mouth daily.     Physical Exam: Filed Vitals:   11/01/14 0904  BP: 117/85  Pulse: 96  Temp: 97.6 F (36.4 C)  TempSrc: Oral  Resp: 18  Height: $Remove'6\' 3"'qhsaVvH$  (1.905 m)  Weight: 144 lb (65.318 kg)  SpO2: 96%    General- elderly male, thin built, in no acute distress Head- normocephalic, atraumatic Throat- moist mucus membrane Eyes- PERRLA, EOMI, no pallor, no icterus, no discharge, normal conjunctiva, normal sclera Neck- no cervical lymphadenopathy, no jugular vein distension Cardiovascular- irregular heart rate, no murmurs, palpable dorsalis pedis and radial pulses, no leg edema Respiratory- bilateral clear to auscultation, no wheeze, no rhonchi, no crackles, no use of accessory muscles Abdomen- bowel sounds present,  soft, non  tender Musculoskeletal- able to move all 4 extremities, generalized weakness, using walker  Neurological- no focal deficit Skin- warm and dry, left arm bruise, left arm has macular rash with erythema, non tender, no pustules noted, some dry skin and scarring on left upper back area, left foot callus present (chronic as per patient) Psychiatry- alert and oriented to person, place and time, normal mood and affect    Labs reviewed: Basic Metabolic Panel:  Recent Labs  10/12/14 0426 10/13/14 0341  10/16/14 0350 10/24/14 2117 10/26/14 0700  NA 141  --   < > 140 143 137  K 4.5  --   < > 4.1 4.6 4.4  CL 107  --   < > 108 110 105  CO2 24  --   < > _0 GLUCOSE 85  --   < > 93 86 72  BUN 15  --   < > 14 34* 28*  CREATININE 1.50*  --   < > 1.31* 1.48* 1.28*  CALCIUM 8.7*  --   < > 9.3 8.9 8.5*  MG 1.8 1.9  --   --  2.0  --   < > = values in this interval not displayed. Liver Function Tests:  Recent Labs  10/12/14 0426 10/24/14 2117 10/26/14 0700  AST 57* 74* 37  ALT 17 68* 48  ALKPHOS 66 56 49  BILITOT 1.2 0.7 0.7  PROT 5.8* 6.4* 5.7*  ALBUMIN 3.0* 3.0* 2.7*   No results for input(s): LIPASE, AMYLASE in the last 8760 hours. No results for input(s): AMMONIA in the last 8760 hours. CBC:  Recent Labs  07/28/14 1334 10/11/14 1205  10/19/14 0610 10/24/14 2117 10/26/14 0700  WBC 8.1 7.8  < > 8.8 9.1 7.1  NEUTROABS 5.1 5.4  --   --  5.0  --   HGB 16.0 14.6  < > 12.7* 12.6* 12.1*  HCT 48.4 43.6  < > 38.4* 38.3* 37.1*  MCV 91.1 90.8  < > 90.6 91.0 90.9  PLT 167 131*  < > 205 320 311  < > = values in this interval not displayed. Cardiac Enzymes:  Recent Labs  10/11/14 1903 10/11/14 2136 10/12/14 0426 10/26/14 0700  CKTOTAL  --   --   --  30*  TROPONINI 17.01* 16.22* 13.21*  --    BNP: Invalid input(s): POCBNP CBG:  Recent Labs  12/11/13 0041 10/12/14 1314 10/24/14 2157  GLUCAP 161* 138* 85    Assessment/Plan  Physical deconditioning Will have him  work with physical therapy and occupational therapy team to help with gait training and muscle strengthening exercises.fall precautions. Skin care. Encourage to be out of bed.   afib Rate controlled. Continue digoxin 0.0625 mg daily with toprol xl 200 mg daily for rate control. Continue coumadin for anticoagulation with goal inr 2-3  Protein calorie malnutrition On strawberry mighty shake but insists on drinking only ensure. Will get RD consult to assess for increasing meal portion and providing protein supplement. Monitor weight  Insomnia Started on melatonin 3 mg daily, tolerating well  Dizziness With change of position, has hx of anemia- check cbc. Also check orthostatics x 3 days. Hydration encouraged. Slow position change recommended, monitor vitals. Fall precautions  Macular rash In left arm for 4 months. With itching present and no resolution, try prednisone 40 mg po daily for 5 days and reassess. Apply benadryl 1% ointment to lesion area for itching  Anemia of chronic disease Monitor h&h  gerd Stable, continue prilosec 40 mg daily  Chronic systolic chf Stable, monitor weight. Continue toprol xl. ACEI on hold with impaired renal function and low bp reading in hospital. With his c/o dizziness, hold off on reintroducing ACEI for now  ckd stage 3 Monitor renal function  Copd Breathing stable. Continue albuterol prn and spiriva  HLD Continue home regimen statin  Depression Stable, continue wellbutrin 75 mg bid, monitor   Goals of care: short term rehabilitation   Labs/tests ordered: cbc, bmp  Family/ staff Communication: reviewed care plan with patient and nursing supervisor    Blanchie Serve, MD  [facility] [phone] (Monday-Friday 8 am - 5 pm) [phone] (afterhours)

## 2014-11-02 NOTE — Patient Outreach (Signed)
Call received from the patient, HIPPA verified.  Patient states he is currently in rehab [[facility]] and that he has some needs when he is discharged for his bathroom and to see if he will be able to get Meals on Wheels or some assistance when he is discharged from the facility.  Patient states that there is suppose to be a discharge planning meeting tomorrow at the facility for him.  Encouraged the patient to reach out to the therapist in the facility regarding an assessment of his home needs.  Will alert the [facility] Managers of the patient's call and potential discharge needs as well.  For further questions, please contact: Natividad Brood, RN BSN [facility] Liaison  [phone] business mobile phone

## 2014-11-03 ENCOUNTER — Other Ambulatory Visit: Payer: Self-pay | Admitting: *Deleted

## 2014-11-03 ENCOUNTER — Encounter: Payer: Commercial Managed Care - HMO | Admitting: Internal Medicine

## 2014-11-03 MED ORDER — OXYCODONE-ACETAMINOPHEN 10-325 MG PO TABS: 1.0000 | ORAL_TABLET | ORAL | Status: DC | PRN

## 2014-11-03 MED ORDER — WARFARIN SODIUM 2.5 MG PO TABS: 7.5000 mg | ORAL_TABLET | ORAL | Status: DC

## 2014-11-03 NOTE — Telephone Encounter (Signed)
Spoke w/ pt, scheduled an appt for wed 6/22 w/ dr Ronnald Ramp at 1315, spoke again with chg nurse at [facility], she spoke w/ the NP, they will write pt enough pain med to last until his visit wed, pt has no one to pick up script, he assures nurse he will be here in clinic wed, script from dr Dareen Piano is in pt's folder

## 2014-11-03 NOTE — Telephone Encounter (Signed)
Pt is being discharged from rehab center 11/06/14. Pt states he has been out of med before coming to rehab. Rx for Percocet on 09/22/13 #150 and got #30 on 10/27/14 from Dr Naaman Plummer. Hilda Blades Arynn Armand RN 11/03/14 10:30AM

## 2014-11-03 NOTE — Telephone Encounter (Signed)
[name] states she will call pt. Pt aware.

## 2014-11-03 NOTE — Telephone Encounter (Signed)
Talked with pt - he is quite upset that pain med too early per Dr Dareen Piano. Pt states he never got Rx 10/27/14 Percocet #30 from Dr Naaman Plummer. That was day discharged from [facility]. Rx was not signed out in clinic. Pt did not want to make an appt at this time - pt states he will come to clinic 11/07/14.

## 2014-11-04 ENCOUNTER — Non-Acute Institutional Stay (SKILLED_NURSING_FACILITY): Payer: Commercial Managed Care - HMO | Admitting: Adult Health

## 2014-11-04 ENCOUNTER — Encounter: Payer: Self-pay | Admitting: Adult Health

## 2014-11-04 DIAGNOSIS — G4700 Insomnia, unspecified: Secondary | ICD-10-CM | POA: Diagnosis not present

## 2014-11-04 DIAGNOSIS — J449 Chronic obstructive pulmonary disease, unspecified: Secondary | ICD-10-CM

## 2014-11-04 DIAGNOSIS — F329 Major depressive disorder, single episode, unspecified: Secondary | ICD-10-CM

## 2014-11-04 DIAGNOSIS — E785 Hyperlipidemia, unspecified: Secondary | ICD-10-CM | POA: Diagnosis not present

## 2014-11-04 DIAGNOSIS — R5381 Other malaise: Secondary | ICD-10-CM | POA: Diagnosis not present

## 2014-11-04 DIAGNOSIS — I4891 Unspecified atrial fibrillation: Secondary | ICD-10-CM | POA: Diagnosis not present

## 2014-11-04 DIAGNOSIS — E875 Hyperkalemia: Secondary | ICD-10-CM | POA: Diagnosis not present

## 2014-11-04 DIAGNOSIS — N183 Chronic kidney disease, stage 3 unspecified: Secondary | ICD-10-CM

## 2014-11-04 DIAGNOSIS — K219 Gastro-esophageal reflux disease without esophagitis: Secondary | ICD-10-CM | POA: Diagnosis not present

## 2014-11-04 DIAGNOSIS — K5900 Constipation, unspecified: Secondary | ICD-10-CM

## 2014-11-04 DIAGNOSIS — F32A Depression, unspecified: Secondary | ICD-10-CM

## 2014-11-04 DIAGNOSIS — I5042 Chronic combined systolic (congestive) and diastolic (congestive) heart failure: Secondary | ICD-10-CM

## 2014-11-04 DIAGNOSIS — E43 Unspecified severe protein-calorie malnutrition: Secondary | ICD-10-CM

## 2014-11-04 NOTE — Progress Notes (Signed)
Patient ID: [name], male   DOB: 05/20/1946, 69 y.o.   MRN: [id number]   10/28/14  Facility:  [facility]ursing Home Location:  [place] Room Number: 507-P LEVEL OF CARE:  SNF (31)   Chief Complaint  Patient presents with  . Hospitalization Follow-up    Physical deconditioning, atrial fibrillation with RVR, COPD, GERD, CKD, depression, insomnia, protein calorie malnutrition, hyperlipidemia and long-term use of anticoagulation    HISTORY OF PRESENT ILLNESS:  This is a 69 year old male who was been admitted to [facility] on 10/27/14 from [facility]. He has PMH of hypertension, CHF, depression, portal vein thrombosis, cardiomyopathy, tachybradycardia syndrome, now with ICD, recurrent DVT, COPD and atrial fibrillation. He presented to the ED with lightheadedness when standing. He was treated for symptomatic persistent atrial fibrillation with RVR. His Toprol-XL and digoxin dosage were adjusted.  He has been admitted for a short-term rehabilitation.  PAST MEDICAL HISTORY:  Past Medical History  Diagnosis Date  . Hypertension   . Chronic systolic heart failure     a. NICM - EF 35% with normal cors 2003. b. Last echo 11/2012 - EF 25-30%.  . Depression   . GERD (gastroesophageal reflux disease)   . Portal vein thrombosis   . Avascular necrosis of bones of both hips     Status post bilateral hip replacements  . Gastritis   . Alcohol abuse   . Erectile dysfunction   . Bipolar disorder   . Hydrocele, unspecified   . Intermittent claudication   . Lung nodule     Chest CT scan on 12/14/2008 showed a nodular opacity at the left lung base felt to most likely represent scarring.  Follow-up chest CT scan on 06/02/2010 showed parenchymal scarring in the left apex, left  lower lobe, and lingula; some of this scarring at the left base had a nodular appearance, unchanged. Chest 09/2012 - stable.  . Hyperthyroidism     Likely due to thyroiditis with possible  amiodarone association.  Thyroid scan 08/28/2009 was normal with no focal areas of abnormal increased or decreased activity seen; the uptake of I 131 sodium iodide at 24 hours was 5.7%.  TSH and free T4 normalized by 08/16/2009.  . Intestinal obstruction   . COPD (chronic obstructive pulmonary disease)   . Clostridium difficile colitis   . E coli bacteremia   . DVT (deep venous thrombosis)     He has hypercoagulability with multiple prior DVTs  . Pleural plaque     H/o asbestos exposure. Chest CT on 06/02/2010 showed stable extensive calcified pleural plaques involving the left hemithorax, consistent with asbestos related pleural disease.  . Weight loss     Normal colonoscopy by Dr. Olevia Perches on 11/06/2010.  . Tachycardia-bradycardia syndrome     a. 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 b. [employer] gen change 2010.  [name] PAF (paroxysmal atrial fibrillation)     Paroxysmal, failed medical therapy with amiodarone but has done very well with multaq;  d/c 2/2 to recurrent AFib;  Tikosyn load c/b Hyperkalemia during admx 05/2013 => rate control strategy  . 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  . Noncompliance   . Thrombocytopenia   . Osteoarthritis   . Spondylosis   . Anxiety   . CKD (chronic kidney disease)     Renal U/S 12/04/2009 showed no pathological findings. Labs 12/04/2009 include normal ESR, C3, C4;  neg ANA; SPEP showed nonspecific increase in the alpha-2 region with no M-spike; UPEP showed no monoclonal free light chains; urine IFE showed polyclonal increase in feree Kappa and/or free Lambda light chains. Baseline Cr reported 1.7-2.5.  . STEMI (ST elevation myocardial infarction) 10/11/2014  . Hyperlipemia   . CHF (congestive heart failure)   . On home oxygen therapy     "3L prn" (10/24/2014)    CURRENT MEDICATIONS: Reviewed per MAR/see medication list  Allergies  Allergen Reactions  .  Digoxin And Related Other (See Comments)    Dig toxicity 2009  . Penicillins Rash     REVIEW OF SYSTEMS:  GENERAL: no change in appetite, no fatigue, no weight changes, no fever, chills or weakness RESPIRATORY: no cough, SOB, DOE, wheezing, hemoptysis CARDIAC: no chest pain, edema or palpitations GI: no abdominal pain, diarrhea, constipation, heart burn, nausea or vomiting  PHYSICAL EXAMINATION  GENERAL: no acute distress, normal body habitus EYES: conjunctivae normal, sclerae normal, normal eye lids NECK: supple, trachea midline, no neck masses, no thyroid tenderness, no thyromegaly LYMPHATICS: no LAN in the neck, no supraclavicular LAN RESPIRATORY: breathing is even & unlabored, BS CTAB CARDIAC: Irregular heart rate, no murmur,no extra heart sounds, no edema GI: abdomen soft, normal BS, no masses, no tenderness, no hepatomegaly, no splenomegaly EXTREMITIES: Able to move 4 extremities PSYCHIATRIC: the patient is alert & oriented to person, affect & behavior appropriate  LABS/RADIOLOGY: Labs reviewed: Basic Metabolic Panel:  Recent Labs  10/12/14 0426 10/13/14 0341  10/16/14 0350 10/24/14 2117 10/26/14 0700  NA 141  --   < > 140 143 137  K 4.5  --   < > 4.1 4.6 4.4  CL 107  --   < > 108 110 105  CO2 24  --   < > '26 24 24  ' GLUCOSE 85  --   < > 93 86 72  BUN 15  --   < > 14 34* 28*  CREATININE 1.50*  --   < > 1.31* 1.48* 1.28*  CALCIUM 8.7*  --   < > 9.3 8.9 8.5*  MG 1.8 1.9  --   --  2.0  --   < > = values in this interval not displayed. Liver Function Tests:  Recent Labs  10/12/14 0426 10/24/14 2117 10/26/14 0700  AST 57* 74* 37  ALT 17 68* 48  ALKPHOS 66 56 49  BILITOT 1.2 0.7 0.7  PROT 5.8* 6.4* 5.7*  ALBUMIN 3.0* 3.0* 2.7*   CBC:  Recent Labs  07/28/14 1334 10/11/14 1205  10/19/14 0610 10/24/14 2117 10/26/14 0700  WBC 8.1 7.8  < > 8.8 9.1 7.1  NEUTROABS 5.1 5.4  --   --  5.0  --   HGB 16.0 14.6  < > 12.7* 12.6* 12.1*  HCT 48.4 43.6  < >  38.4* 38.3* 37.1*  MCV 91.1 90.8  < > 90.6 91.0 90.9  PLT 167 131*  < > 205 320 311  < > = values in this interval not displayed.  Lipid Panel:  Recent Labs  07/28/14 1334 10/12/14 0426  HDL 41 36*   Cardiac Enzymes:  Recent Labs  10/11/14 1903 10/11/14 2136 10/12/14 0426 10/26/14 0700  CKTOTAL  --   --   --  30*  TROPONINI 17.01* 16.22* 13.21*  --    CBG:  Recent Labs  12/11/13 0041 10/12/14 1314 10/24/14 2157  GLUCAP 161* 138* 85    X-ray Chest Pa And Lateral  10/24/2014  CLINICAL DATA:  Dyspnea  EXAM: CHEST  2 VIEW  COMPARISON:  None.  FINDINGS: Lungs are hyperinflated. There is left basilar pleural plaque. There is left basilar airspace disease unchanged compared with the prior exams likely reflecting scarring. There is no pneumothorax. There is a nodular opacity in the right lower lung which reflect superimposition of the inferior aspect of the scapula overlying a posterior rib. There is stable cardiomegaly. There is thoracic aortic atherosclerosis. There is a dual lead AICD. There is no acute osseous abnormality.  IMPRESSION: No active cardiopulmonary disease.  Chronic changes at the left lung base.   Electronically Signed   By: Kathreen Devoid   On: 10/24/2014 21:48   Ct Head Wo Contrast  10/11/2014   CLINICAL DATA:  Code stroke. RIGHT-sided facial droop with slurred speech.  EXAM: CT HEAD WITHOUT CONTRAST  TECHNIQUE: Contiguous axial images were obtained from the base of the skull through the vertex without intravenous contrast.  COMPARISON:  CT head 10/12/2012.  FINDINGS: No evidence for acute infarction, hemorrhage, mass lesion, hydrocephalus, or extra-axial fluid. Normal for age cerebral volume. No white matter disease. Calvarium intact. No sinus or mastoid fluid.  IMPRESSION: Negative exam.  No change from priors.  Critical Value/emergent results were called by telephone at the time of interpretation on 10/11/2014 at 1:12 pm to Dr. Janann Colonel , who verbally acknowledged  these results.   Electronically Signed   By: Rolla Flatten M.D.   On: 10/11/2014 13:14   Ct Chest W Contrast  10/13/2014   CLINICAL DATA:  69 year old male with unexplained weight loss. Patient with myocardial infarction, CVA, multiple arch O emboli. Acute on chronic kidney disease.  EXAM: CT CHEST, ABDOMEN, AND PELVIS WITH CONTRAST  TECHNIQUE: Multidetector CT imaging of the chest, abdomen and pelvis was performed following the standard protocol during bolus administration of intravenous contrast.  CONTRAST:  31m OMNIPAQUE IOHEXOL 300 MG/ML  SOLN  COMPARISON:  06/02/2013 and prior chest CTs  FINDINGS: CT CHEST FINDINGS  Mediastinum/Nodes: Cardiomegaly again noted. Moderate coronary artery calcifications are identified. The ascending aorta measures 3.7 cm and greatest diameter. No enlarged lymph nodes are identified. There is no evidence of pericardial effusion.  Lungs/Pleura: Dependent and bibasilar atelectasis/ scarring again noted. Scarring within the mid and lower left lung again identified. Patchy ground-glass opacities within the left upper lobe may represent aspiration or for pneumonia. There is no evidence of suspicious mass or nodule. COPD/emphysema again identified. Left posterior pleural calcifications are again identified.  Musculoskeletal: No acute or suspicious abnormalities.  CT ABDOMEN AND PELVIS FINDINGS  Hepatobiliary: The liver and gallbladder are unremarkable. There is no evidence of biliary dilatation.  Pancreas: Unremarkable  Spleen: Unremarkable  Adrenals/Urinary Tract: Moderate bilateral renal atrophy and cortical thinning noted, right greater than left. There is no evidence of hydronephrosis, renal mass or definite renal calculi.  Stomach/Bowel: There is no evidence of bowel obstruction. No definite bowel wall thickening noted. No dilated bowel loops are present. Hip replacement artifact obscures detail within the pelvis.  Vascular/Lymphatic: No enlarged lymph nodes or abdominal aortic  aneurysm identified. Calcifications within the IVC, iliac and femoral veins noted.  Reproductive: Prostate not well visualized.  Other: No free fluid, abscess or pneumoperitoneum.  Musculoskeletal: Mild stranding in the left inguinal region is likely postoperative. Bilateral hip replacements identified. No acute bony abnormalities are identified. Degenerative changes in the lumbar spine and bilateral L5 pars defects again noted.  IMPRESSION: No evidence of primary malignancy.  Patchy left upper lobe ground-glass opacities -  possible aspiration versus pneumonia.  Cardiomegaly, coronary artery disease, ectatic ascending thoracic aorta, COPD/emphysema and moderate bilateral renal atrophy.   Electronically Signed   By: Margarette Canada M.D.   On: 10/13/2014 14:10   Ct Abdomen Pelvis W Contrast  10/13/2014   CLINICAL DATA:  69 year old male with unexplained weight loss. Patient with myocardial infarction, CVA, multiple arch O emboli. Acute on chronic kidney disease.  EXAM: CT CHEST, ABDOMEN, AND PELVIS WITH CONTRAST  TECHNIQUE: Multidetector CT imaging of the chest, abdomen and pelvis was performed following the standard protocol during bolus administration of intravenous contrast.  CONTRAST:  68m OMNIPAQUE IOHEXOL 300 MG/ML  SOLN  COMPARISON:  06/02/2013 and prior chest CTs  FINDINGS: CT CHEST FINDINGS  Mediastinum/Nodes: Cardiomegaly again noted. Moderate coronary artery calcifications are identified. The ascending aorta measures 3.7 cm and greatest diameter. No enlarged lymph nodes are identified. There is no evidence of pericardial effusion.  Lungs/Pleura: Dependent and bibasilar atelectasis/ scarring again noted. Scarring within the mid and lower left lung again identified. Patchy ground-glass opacities within the left upper lobe may represent aspiration or for pneumonia. There is no evidence of suspicious mass or nodule. COPD/emphysema again identified. Left posterior pleural calcifications are again identified.   Musculoskeletal: No acute or suspicious abnormalities.  CT ABDOMEN AND PELVIS FINDINGS  Hepatobiliary: The liver and gallbladder are unremarkable. There is no evidence of biliary dilatation.  Pancreas: Unremarkable  Spleen: Unremarkable  Adrenals/Urinary Tract: Moderate bilateral renal atrophy and cortical thinning noted, right greater than left. There is no evidence of hydronephrosis, renal mass or definite renal calculi.  Stomach/Bowel: There is no evidence of bowel obstruction. No definite bowel wall thickening noted. No dilated bowel loops are present. Hip replacement artifact obscures detail within the pelvis.  Vascular/Lymphatic: No enlarged lymph nodes or abdominal aortic aneurysm identified. Calcifications within the IVC, iliac and femoral veins noted.  Reproductive: Prostate not well visualized.  Other: No free fluid, abscess or pneumoperitoneum.  Musculoskeletal: Mild stranding in the left inguinal region is likely postoperative. Bilateral hip replacements identified. No acute bony abnormalities are identified. Degenerative changes in the lumbar spine and bilateral L5 pars defects again noted.  IMPRESSION: No evidence of primary malignancy.  Patchy left upper lobe ground-glass opacities - possible aspiration versus pneumonia.  Cardiomegaly, coronary artery disease, ectatic ascending thoracic aorta, COPD/emphysema and moderate bilateral renal atrophy.   Electronically Signed   By: JMargarette CanadaM.D.   On: 10/13/2014 14:10   Dg Chest Port 1 View  10/11/2014   CLINICAL DATA:  Shortness of breath.  EXAM: PORTABLE CHEST - 1 VIEW  COMPARISON:  10/11/2014 and 01/17/2014  FINDINGS: Right-sided pacemaker unchanged. Lungs are adequately inflated with stable chronic elevation of the left hemidiaphragm and stable chronic changes over the left lung. Known calcified pleural plaques. No focal lobar consolidation or effusion cardiomediastinal silhouette and remainder of the exam is unchanged.  IMPRESSION: No active  disease.  Chronic stable changes of the left lung. Evidence of asbestos related pleural disease.   Electronically Signed   By: DMarin OlpM.D.   On: 10/11/2014 21:56   Dg Chest Portable 1 View  10/11/2014   CLINICAL DATA:  Right facial droop  EXAM: PORTABLE CHEST - 1 VIEW  COMPARISON:  01/17/2014  FINDINGS: Cardiac shadow is again mildly enlarged. A pacing device is again seen. Chronic left pleural effusion and left basilar changes are noted although they have increased in the interval from the prior exam. The right lung remains clear. The visualized upper  abdomen is unremarkable.  IMPRESSION: Increasing chronic changes in the left lung base when compare with the prior exam. Calcified pleural plaques are again noted.   Electronically Signed   By: Inez Catalina M.D.   On: 10/11/2014 14:06   Dg Ang/ext/uni/or Left  10/12/2014   CLINICAL DATA:  Femoral thrombectomy for embolic occlusion in the left lower extremity.  EXAM: LEFT ANG/EXT/UNI/ OR  COMPARISON:  None.  FINDINGS: Intraoperative image shows a normally patent popliteal artery and normally patent visualized anterior tibial, peroneal and posterior tibial arteries to nearly the level of the ankle. No filling defects are identified.  IMPRESSION: No distal occlusions or filling defects identified at the level of the popliteal and tibial arteries.   Electronically Signed   By: Aletta Edouard M.D.   On: 10/12/2014 14:02    ASSESSMENT/PLAN:  Physical deconditioning - for rehabilitation Atrial fibrillation with RVR - rate controlled; continue digoxin 0.0625 mg by mouth daily, metoprolol 200 mg 24-hour by mouth daily and Coumadin 7.5 mg by mouth daily COPD - continue Spiriva 18 g 1 capsule into inhaler and inhaled daily and Ventolin HFA inhaler 2 puffs into the lumps every 6 hours when necessary GERD - continue Prilosec 40 mg by mouth daily CKD, stage III - creatinine 1.28; will monitor Depression - continue Wellbutrin 75 mg by mouth twice a  day Insomnia - continue melatonin 3 mg by mouth daily at bedtime Protein calorie malnutrition, severe - albumin 2.7; start strawberry mighty shake on trace 3 times a day Hyperlipidemia - continue Crestor 20 mg by mouth daily Long-term use of anticoagulation - INR 2.0, therapeutic; continue Coumadin 7.5 mg by mouth daily and recheck INR on 11/01/14   Goals of care:  Short-term rehabilitation  Spent 50 minutes in patient care.    [facility], NP [employer] [phone]

## 2014-11-04 NOTE — Progress Notes (Signed)
Patient ID: [name], male   DOB: 02/17/1946, 68 y.o.   MRN: [id number]   11/04/14  Facility:  Nursing Home Location:  [facility] Room Number: 507-P LEVEL OF CARE:  SNF (31)   Chief Complaint  Patient presents with  . Discharge Note    Physical deconditioning, atrial fibrillation with RVR, COPD, CHF, GERD, CKD, depression, insomnia, protein calorie malnutrition hyperkalemia, constipation and hyperlipidemia    HISTORY OF PRESENT ILLNESS:  This is a 68-year-old male who is for discharge home with Home health PT for strengthening, endurance and safety, Nursing for disease management and Coumadin monitoring and Social Worker for community resource information referral. He has been admitted to [facility] on 10/27/14 from [facility]. He has PMH of hypertension, CHF, depression, portal vein thrombosis, cardiomyopathy, tachybradycardia syndrome, now with ICD, recurrent DVT, COPD and atrial fibrillation. He presented to the ED with lightheadedness when standing. He was treated for symptomatic persistent atrial fibrillation with RVR. His Toprol-XL and digoxin dosage were adjusted.  Latest K 5.7. Patient is alert and oriented. He complains of constipation.  Patient was admitted to this facility for short-term rehabilitation after the patient's recent hospitalization.  Patient has completed SNF rehabilitation and therapy has cleared the patient for discharge.  PAST MEDICAL HISTORY:  Past Medical History  Diagnosis Date  . Hypertension   . Chronic systolic heart failure     a. NICM - EF 35% with normal cors 2003. b. Last echo 11/2012 - EF 25-30%.  . Depression   . GERD (gastroesophageal reflux disease)   . Portal vein thrombosis   . Avascular necrosis of bones of both hips     Status post bilateral hip replacements  . Gastritis   . Alcohol abuse   . Erectile dysfunction   . Bipolar disorder   . Hydrocele, unspecified   . Intermittent claudication     . Lung nodule     Chest CT scan on 12/14/2008 showed a nodular opacity at the left lung base felt to most likely represent scarring.  Follow-up chest CT scan on 06/02/2010 showed parenchymal scarring in the left apex, left  lower lobe, and lingula; some of this scarring at the left base had a nodular appearance, unchanged. Chest 09/2012 - stable.  . 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  Thyroid scan 08/28/2009 was normal with no focal areas of abnormal increased or decreased activity seen; the uptake of I 131 sodium iodide at 24 hours was 5.7%.  TSH and free T4 normalized by 08/16/2009.  . Intestinal obstruction   . COPD (chronic obstructive pulmonary disease)   . Clostridium difficile colitis   . E coli bacteremia   . DVT (deep venous thrombosis)     He has hypercoagulability with multiple prior DVTs  . Pleural plaque     H/o asbestos exposure. Chest CT on 06/02/2010 showed stable extensive calcified pleural plaques involving the left hemithorax, consistent with asbestos related pleural disease.  . Weight loss     Normal colonoscopy by Dr. Brodie on 11/06/2010.  . Tachycardia-bradycardia syndrome     a. 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 b. [employer] gen change 2010.  . PAF (paroxysmal atrial fibrillation)     Paroxysmal, failed medical therapy with amiodarone but has done very well with multaq;  d/c 2/2 to recurrent AFib;  Tikosyn load c/b Hyperkalemia during admx 05/2013 => rate control strategy  . Thrombosis       History of arterial and venous thrombosis including portal vein thrombosis, deep vein thrombosis, and superior mesenteric artery thrombosis.  . Noncompliance   . Thrombocytopenia   . Osteoarthritis   . Spondylosis   . Anxiety   . CKD (chronic kidney disease)     Renal U/S 12/04/2009 showed no pathological findings. Labs 12/04/2009 include normal ESR, C3, C4; neg ANA; SPEP showed nonspecific increase in the alpha-2 region with no M-spike; UPEP  showed no monoclonal free light chains; urine IFE showed polyclonal increase in feree Kappa and/or free Lambda light chains. Baseline Cr reported 1.7-2.5.  . STEMI (ST elevation myocardial infarction) 10/11/2014  . Hyperlipemia   . CHF (congestive heart failure)   . On home oxygen therapy     "3L prn" (10/24/2014)    CURRENT MEDICATIONS: Reviewed per MAR/see medication list  Allergies  Allergen Reactions  . Digoxin And Related Other (See Comments)    Dig toxicity 2009  . Penicillins Rash     REVIEW OF SYSTEMS:  GENERAL: no change in appetite, no fatigue, no weight changes, no fever, chills or weakness RESPIRATORY: no cough, SOB, DOE, wheezing, hemoptysis CARDIAC: no chest pain, edema or palpitations GI: no abdominal pain, diarrhea, heart burn, nausea or vomiting, +constipation  PHYSICAL EXAMINATION  GENERAL: no acute distress, normal body habitus NECK: supple, trachea midline, no neck masses, no thyroid tenderness, no thyromegaly LYMPHATICS: no LAN in the neck, no supraclavicular LAN RESPIRATORY: breathing is even & unlabored, BS CTAB CARDIAC: Irregular heart rate, no murmur,no extra heart sounds, no edema GI: abdomen soft, normal BS, no masses, no tenderness, no hepatomegaly, no splenomegaly EXTREMITIES: Able to move 4 extremities PSYCHIATRIC: the patient is alert & oriented to person, affect & behavior appropriate  LABS/RADIOLOGY: Labs reviewed: 11/02/14  WBC 8.1 hemoglobin 13.2 hematocrit 39.3 MCV 90.6 platelet 218 sodium 141 potassium 5.7 glucose 129 BUN 30 creatinine 1.46 calcium 9.0 Basic Metabolic Panel:  Recent Labs  10/12/14 0426 10/13/14 0341  10/16/14 0350 10/24/14 2117 10/26/14 0700  NA 141  --   < > 140 143 137  K 4.5  --   < > 4.1 4.6 4.4  CL 107  --   < > 108 110 105  CO2 24  --   < > _0 GLUCOSE 85  --   < > 93 86 72  BUN 15  --   < > 14 34* 28*  CREATININE 1.50*  --   < > 1.31* 1.48* 1.28*  CALCIUM 8.7*  --   < > 9.3 8.9 8.5*  MG 1.8 1.9  --    --  2.0  --   < > = values in this interval not displayed. Liver Function Tests:  Recent Labs  10/12/14 0426 10/24/14 2117 10/26/14 0700  AST 57* 74* 37  ALT 17 68* 48  ALKPHOS 66 56 49  BILITOT 1.2 0.7 0.7  PROT 5.8* 6.4* 5.7*  ALBUMIN 3.0* 3.0* 2.7*   CBC:  Recent Labs  07/28/14 1334 10/11/14 1205  10/19/14 0610 10/24/14 2117 10/26/14 0700  WBC 8.1 7.8  < > 8.8 9.1 7.1  NEUTROABS 5.1 5.4  --   --  5.0  --   HGB 16.0 14.6  < > 12.7* 12.6* 12.1*  HCT 48.4 43.6  < > 38.4* 38.3* 37.1*  MCV 91.1 90.8  < > 90.6 91.0 90.9  PLT 167 131*  < > 205 320 311  < > = values in this interval not displayed.  Lipid  Panel:  Recent Labs  07/28/14 1334 10/12/14 0426  HDL 41 36*   Cardiac Enzymes:  Recent Labs  10/11/14 1903 10/11/14 2136 10/12/14 0426 10/26/14 0700  CKTOTAL  --   --   --  30*  TROPONINI 17.01* 16.22* 13.21*  --    CBG:  Recent Labs  12/11/13 0041 10/12/14 1314 10/24/14 2157  GLUCAP 161* 138* 85    X-ray Chest Pa And Lateral  10/24/2014   CLINICAL DATA:  Dyspnea  EXAM: CHEST  2 VIEW  COMPARISON:  None.  FINDINGS: Lungs are hyperinflated. There is left basilar pleural plaque. There is left basilar airspace disease unchanged compared with the prior exams likely reflecting scarring. There is no pneumothorax. There is a nodular opacity in the right lower lung which reflect superimposition of the inferior aspect of the scapula overlying a posterior rib. There is stable cardiomegaly. There is thoracic aortic atherosclerosis. There is a dual lead AICD. There is no acute osseous abnormality.  IMPRESSION: No active cardiopulmonary disease.  Chronic changes at the left lung base.   Electronically Signed   By: Kathreen Devoid   On: 10/24/2014 21:48   Ct Head Wo Contrast  10/11/2014   CLINICAL DATA:  Code stroke. RIGHT-sided facial droop with slurred speech.  EXAM: CT HEAD WITHOUT CONTRAST  TECHNIQUE: Contiguous axial images were obtained from the base of the skull  through the vertex without intravenous contrast.  COMPARISON:  CT head 10/12/2012.  FINDINGS: No evidence for acute infarction, hemorrhage, mass lesion, hydrocephalus, or extra-axial fluid. Normal for age cerebral volume. No white matter disease. Calvarium intact. No sinus or mastoid fluid.  IMPRESSION: Negative exam.  No change from priors.  Critical Value/emergent results were called by telephone at the time of interpretation on 10/11/2014 at 1:12 pm to Dr. Janann Colonel , who verbally acknowledged these results.   Electronically Signed   By: Rolla Flatten M.D.   On: 10/11/2014 13:14   Ct Chest W Contrast  10/13/2014   CLINICAL DATA:  69 year old male with unexplained weight loss. Patient with myocardial infarction, CVA, multiple arch O emboli. Acute on chronic kidney disease.  EXAM: CT CHEST, ABDOMEN, AND PELVIS WITH CONTRAST  TECHNIQUE: Multidetector CT imaging of the chest, abdomen and pelvis was performed following the standard protocol during bolus administration of intravenous contrast.  CONTRAST:  67m OMNIPAQUE IOHEXOL 300 MG/ML  SOLN  COMPARISON:  06/02/2013 and prior chest CTs  FINDINGS: CT CHEST FINDINGS  Mediastinum/Nodes: Cardiomegaly again noted. Moderate coronary artery calcifications are identified. The ascending aorta measures 3.7 cm and greatest diameter. No enlarged lymph nodes are identified. There is no evidence of pericardial effusion.  Lungs/Pleura: Dependent and bibasilar atelectasis/ scarring again noted. Scarring within the mid and lower left lung again identified. Patchy ground-glass opacities within the left upper lobe may represent aspiration or for pneumonia. There is no evidence of suspicious mass or nodule. COPD/emphysema again identified. Left posterior pleural calcifications are again identified.  Musculoskeletal: No acute or suspicious abnormalities.  CT ABDOMEN AND PELVIS FINDINGS  Hepatobiliary: The liver and gallbladder are unremarkable. There is no evidence of biliary dilatation.   Pancreas: Unremarkable  Spleen: Unremarkable  Adrenals/Urinary Tract: Moderate bilateral renal atrophy and cortical thinning noted, right greater than left. There is no evidence of hydronephrosis, renal mass or definite renal calculi.  Stomach/Bowel: There is no evidence of bowel obstruction. No definite bowel wall thickening noted. No dilated bowel loops are present. Hip replacement artifact obscures detail within the pelvis.  Vascular/Lymphatic: No enlarged  lymph nodes or abdominal aortic aneurysm identified. Calcifications within the IVC, iliac and femoral veins noted.  Reproductive: Prostate not well visualized.  Other: No free fluid, abscess or pneumoperitoneum.  Musculoskeletal: Mild stranding in the left inguinal region is likely postoperative. Bilateral hip replacements identified. No acute bony abnormalities are identified. Degenerative changes in the lumbar spine and bilateral L5 pars defects again noted.  IMPRESSION: No evidence of primary malignancy.  Patchy left upper lobe ground-glass opacities - possible aspiration versus pneumonia.  Cardiomegaly, coronary artery disease, ectatic ascending thoracic aorta, COPD/emphysema and moderate bilateral renal atrophy.   Electronically Signed   By: Margarette Canada M.D.   On: 10/13/2014 14:10   Ct Abdomen Pelvis W Contrast  10/13/2014   CLINICAL DATA:  69 year old male with unexplained weight loss. Patient with myocardial infarction, CVA, multiple arch O emboli. Acute on chronic kidney disease.  EXAM: CT CHEST, ABDOMEN, AND PELVIS WITH CONTRAST  TECHNIQUE: Multidetector CT imaging of the chest, abdomen and pelvis was performed following the standard protocol during bolus administration of intravenous contrast.  CONTRAST:  87m OMNIPAQUE IOHEXOL 300 MG/ML  SOLN  COMPARISON:  06/02/2013 and prior chest CTs  FINDINGS: CT CHEST FINDINGS  Mediastinum/Nodes: Cardiomegaly again noted. Moderate coronary artery calcifications are identified. The ascending aorta measures 3.7  cm and greatest diameter. No enlarged lymph nodes are identified. There is no evidence of pericardial effusion.  Lungs/Pleura: Dependent and bibasilar atelectasis/ scarring again noted. Scarring within the mid and lower left lung again identified. Patchy ground-glass opacities within the left upper lobe may represent aspiration or for pneumonia. There is no evidence of suspicious mass or nodule. COPD/emphysema again identified. Left posterior pleural calcifications are again identified.  Musculoskeletal: No acute or suspicious abnormalities.  CT ABDOMEN AND PELVIS FINDINGS  Hepatobiliary: The liver and gallbladder are unremarkable. There is no evidence of biliary dilatation.  Pancreas: Unremarkable  Spleen: Unremarkable  Adrenals/Urinary Tract: Moderate bilateral renal atrophy and cortical thinning noted, right greater than left. There is no evidence of hydronephrosis, renal mass or definite renal calculi.  Stomach/Bowel: There is no evidence of bowel obstruction. No definite bowel wall thickening noted. No dilated bowel loops are present. Hip replacement artifact obscures detail within the pelvis.  Vascular/Lymphatic: No enlarged lymph nodes or abdominal aortic aneurysm identified. Calcifications within the IVC, iliac and femoral veins noted.  Reproductive: Prostate not well visualized.  Other: No free fluid, abscess or pneumoperitoneum.  Musculoskeletal: Mild stranding in the left inguinal region is likely postoperative. Bilateral hip replacements identified. No acute bony abnormalities are identified. Degenerative changes in the lumbar spine and bilateral L5 pars defects again noted.  IMPRESSION: No evidence of primary malignancy.  Patchy left upper lobe ground-glass opacities - possible aspiration versus pneumonia.  Cardiomegaly, coronary artery disease, ectatic ascending thoracic aorta, COPD/emphysema and moderate bilateral renal atrophy.   Electronically Signed   By: JMargarette CanadaM.D.   On: 10/13/2014 14:10    Dg Chest Port 1 View  10/11/2014   CLINICAL DATA:  Shortness of breath.  EXAM: PORTABLE CHEST - 1 VIEW  COMPARISON:  10/11/2014 and 01/17/2014  FINDINGS: Right-sided pacemaker unchanged. Lungs are adequately inflated with stable chronic elevation of the left hemidiaphragm and stable chronic changes over the left lung. Known calcified pleural plaques. No focal lobar consolidation or effusion cardiomediastinal silhouette and remainder of the exam is unchanged.  IMPRESSION: No active disease.  Chronic stable changes of the left lung. Evidence of asbestos related pleural disease.   Electronically Signed   By:  Marin Olp M.D.   On: 10/11/2014 21:56   Dg Chest Portable 1 View  10/11/2014   CLINICAL DATA:  Right facial droop  EXAM: PORTABLE CHEST - 1 VIEW  COMPARISON:  01/17/2014  FINDINGS: Cardiac shadow is again mildly enlarged. A pacing device is again seen. Chronic left pleural effusion and left basilar changes are noted although they have increased in the interval from the prior exam. The right lung remains clear. The visualized upper abdomen is unremarkable.  IMPRESSION: Increasing chronic changes in the left lung base when compare with the prior exam. Calcified pleural plaques are again noted.   Electronically Signed   By: Inez Catalina M.D.   On: 10/11/2014 14:06   Dg Ang/ext/uni/or Left  10/12/2014   CLINICAL DATA:  Femoral thrombectomy for embolic occlusion in the left lower extremity.  EXAM: LEFT ANG/EXT/UNI/ OR  COMPARISON:  None.  FINDINGS: Intraoperative image shows a normally patent popliteal artery and normally patent visualized anterior tibial, peroneal and posterior tibial arteries to nearly the level of the ankle. No filling defects are identified.  IMPRESSION: No distal occlusions or filling defects identified at the level of the popliteal and tibial arteries.   Electronically Signed   By: Aletta Edouard M.D.   On: 10/12/2014 14:02    ASSESSMENT/PLAN:  Physical deconditioning - for  rehabilitation Atrial fibrillation with RVR - rate controlled; continue digoxin 0.0625 mg by mouth daily, metoprolol 200 mg 24-hour by mouth daily and Coumadin 7.5 mg by mouth daily COPD - continue Spiriva 18 g 1 capsule into inhaler and inhaled daily and Ventolin HFA inhaler 2 puffs into the lumps every 6 hours when necessary GERD - continue Prilosec 40 mg by mouth daily CKD, stage III - creatinine 1.46; will monitor Depression - continue Wellbutrin 75 mg by mouth twice a day Insomnia - continue melatonin 3 mg by mouth daily at bedtime Protein calorie malnutrition, severe - albumin 2.7; start strawberry mighty shake on trace 3 times a day Hyperlipidemia - continue Crestor 20 mg by mouth daily Hyperkalemia - K5.7; give Kayexalate 15 g/60 mL by mouth 1 and will re-check BMP Constipation - start senna S2 tabs by mouth twice a day,  MiraLAX 17 g +46 ounces liquid by mouth twice a day and Dulcolax suppository 1 per rectum daily Chronic combined systolic and diastolic CHF - stable; continue metoprolol 200 mg 24-hour 1 by mouth daily and digoxin 0.0625 mg by mouth daily    I have filled out patient's discharge paperwork and written prescriptions.  Patient will receive home health PT, Nursing and Social Worker.  Total discharge time: Greater than 30 minutes  Discharge time involved coordination of the discharge process with social worker, nursing staff and therapy department. Medical justification for home health services verified.    [facility], NP [employer] [phone]

## 2014-11-05 ENCOUNTER — Other Ambulatory Visit: Payer: Self-pay | Admitting: Internal Medicine

## 2014-11-05 IMAGING — CR DG HIP W/ PELVIS BILAT
6 series · 6 of 6 positions shown · non-contrast
Comparison: None.

CLINICAL DATA: Post bilateral hip arthroplasty with worsening
bilateral hip pain, right greater than left

EXAM:
BILATERAL HIP WITH PELVIS - 4+ VIEW

[t pelvis a.p. (1 of 2)]
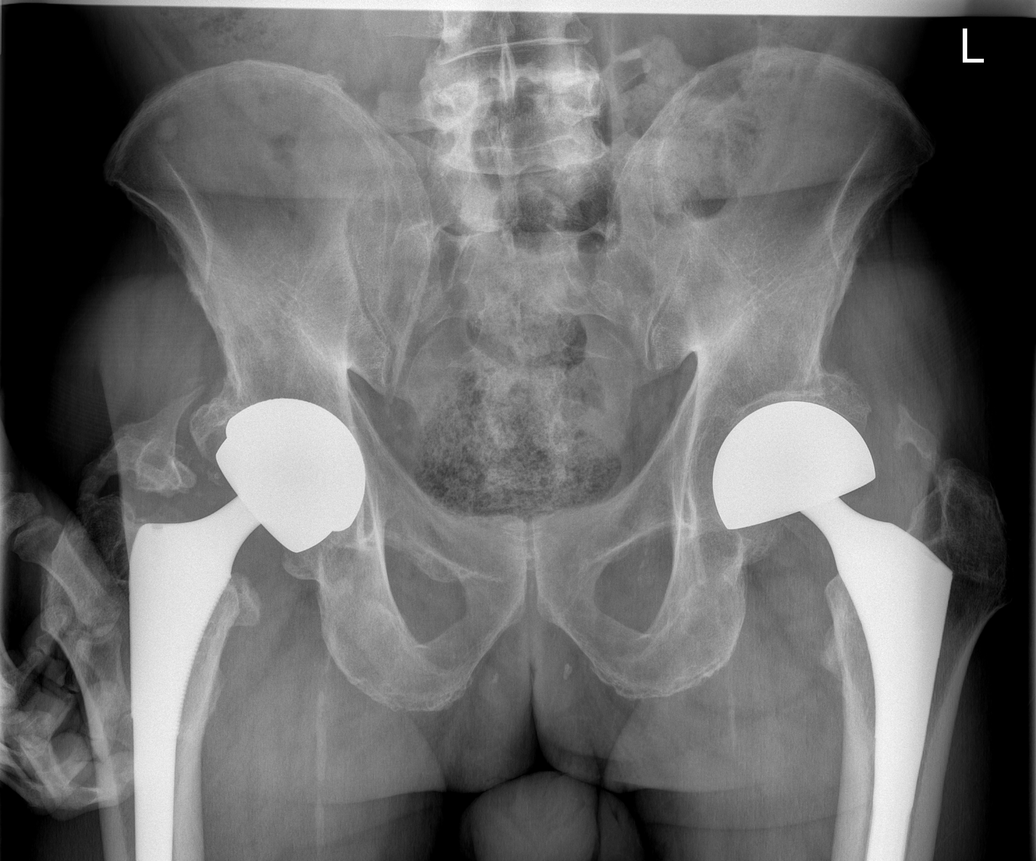

[t pelvis a.p. (2 of 2)]
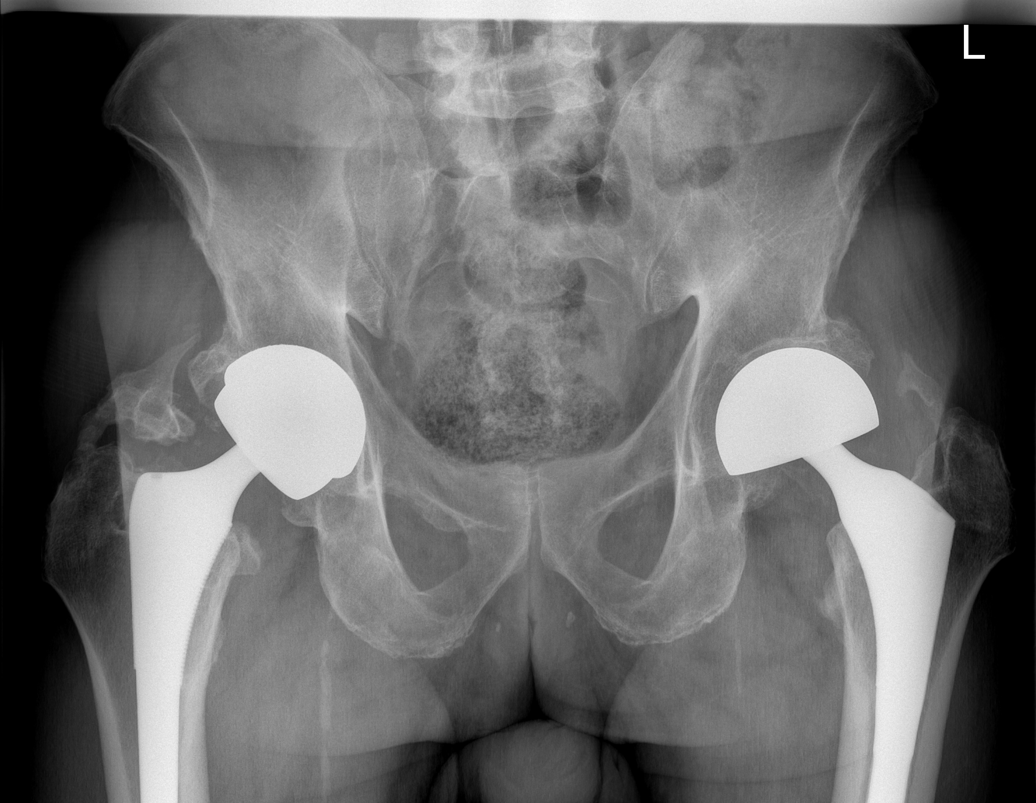

[t hip ap left]
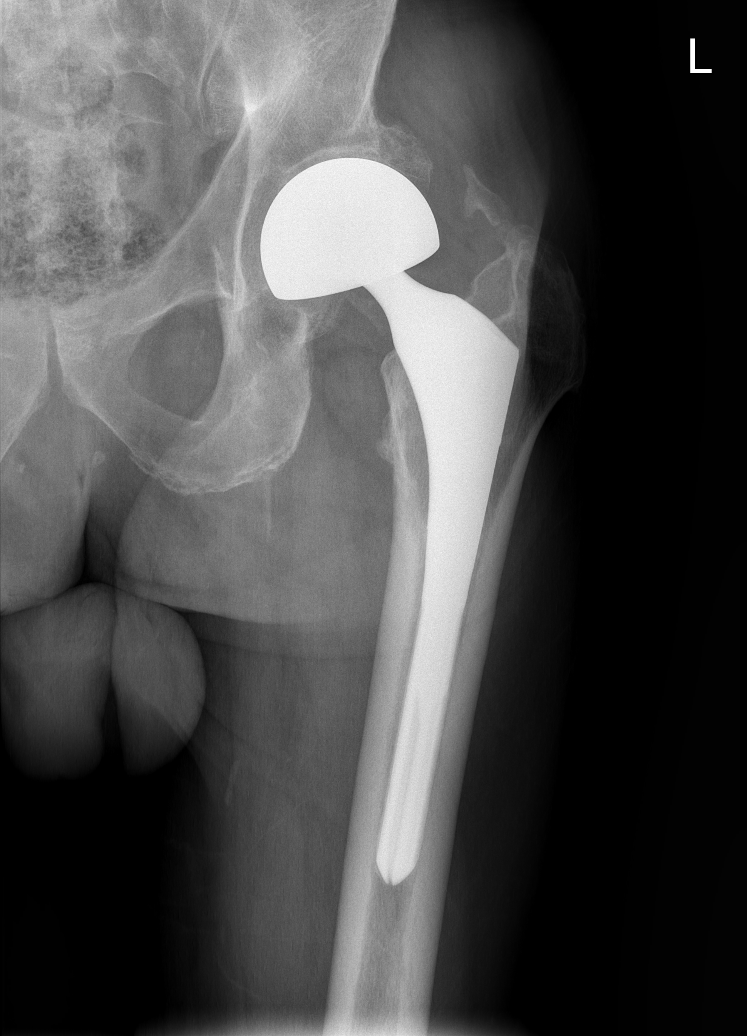

[t hip ap right]
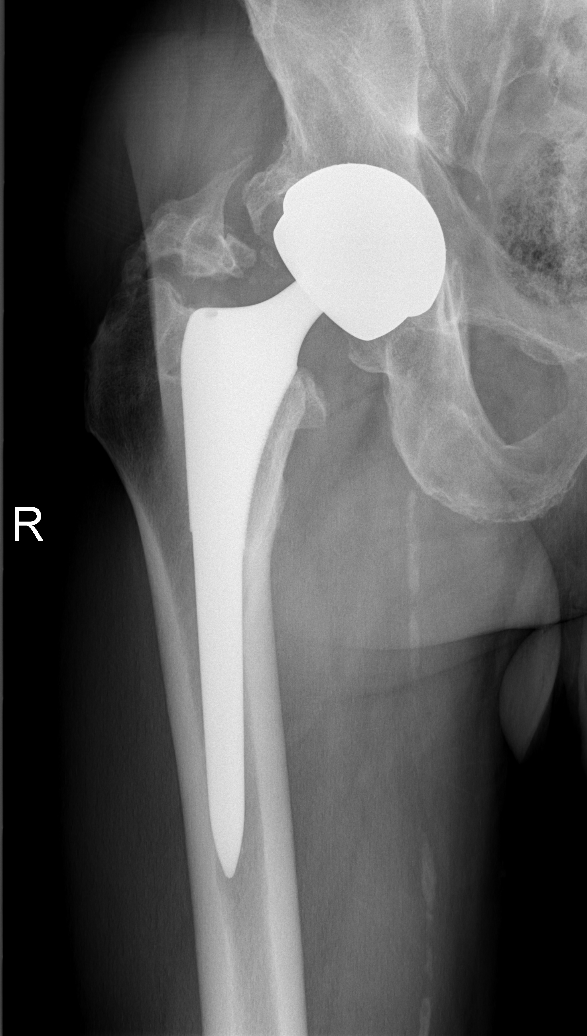

[t hip frog leg right]
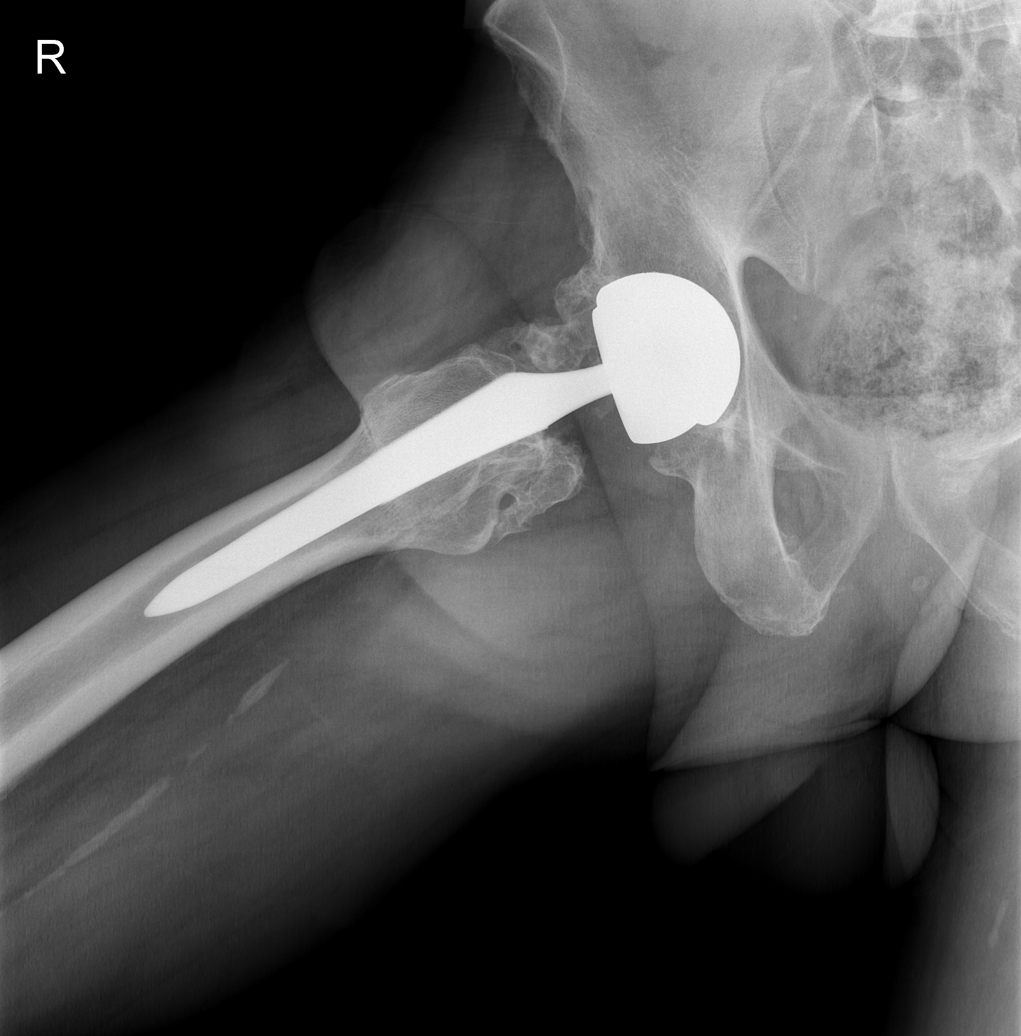

[t hip frog leg left]
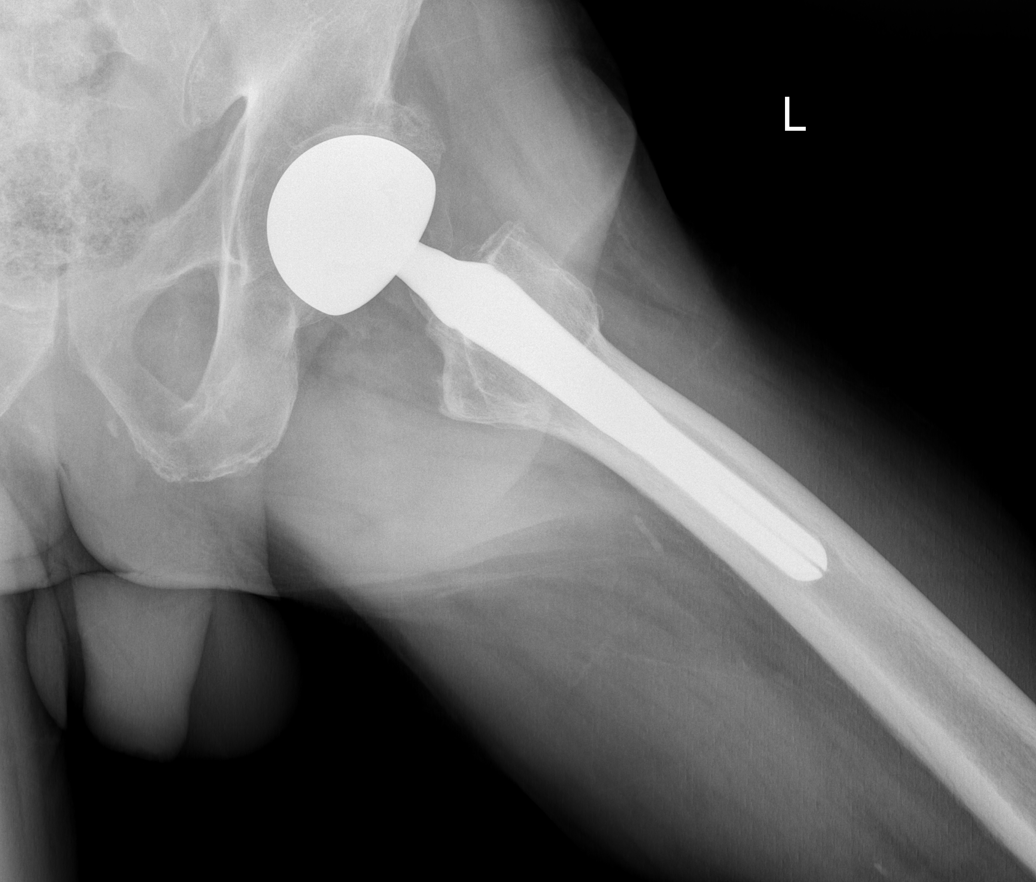

[6 of 6 positions shown; findings below may reference images not displayed]

FINDINGS: Post right-sided total hip replacement. No evidence of hardware
failure loosening. Osseous fragments are noted about the right
greater trochanter and are presumably iatrogenic in etiology.

Post left-sided bipolar hip arthroplasty without evidence of
hardware failure loosening. Osseous fragments are noted about the
left greater trochanter and are also presumably iatrogenic etiology.

Limited visualization of the adjacent pelvis is otherwise normal.

Moderate colonic stool burden within the rectal vault. Vascular
calcifications.
IMPRESSION: Post bilateral hip replacements without evidence of hardware failure
or loosening.

## 2014-11-07 DIAGNOSIS — I129 Hypertensive chronic kidney disease with stage 1 through stage 4 chronic kidney disease, or unspecified chronic kidney disease: Secondary | ICD-10-CM | POA: Diagnosis not present

## 2014-11-07 DIAGNOSIS — I480 Paroxysmal atrial fibrillation: Secondary | ICD-10-CM | POA: Diagnosis not present

## 2014-11-07 DIAGNOSIS — Z48812 Encounter for surgical aftercare following surgery on the circulatory system: Secondary | ICD-10-CM | POA: Diagnosis not present

## 2014-11-07 DIAGNOSIS — I213 ST elevation (STEMI) myocardial infarction of unspecified site: Secondary | ICD-10-CM | POA: Diagnosis not present

## 2014-11-07 DIAGNOSIS — I5022 Chronic systolic (congestive) heart failure: Secondary | ICD-10-CM | POA: Diagnosis not present

## 2014-11-07 DIAGNOSIS — Z8673 Personal history of transient ischemic attack (TIA), and cerebral infarction without residual deficits: Secondary | ICD-10-CM | POA: Diagnosis not present

## 2014-11-08 ENCOUNTER — Telehealth: Payer: Self-pay | Admitting: Internal Medicine

## 2014-11-08 ENCOUNTER — Telehealth: Payer: Self-pay | Admitting: Pharmacist

## 2014-11-08 DIAGNOSIS — I129 Hypertensive chronic kidney disease with stage 1 through stage 4 chronic kidney disease, or unspecified chronic kidney disease: Secondary | ICD-10-CM | POA: Diagnosis not present

## 2014-11-08 DIAGNOSIS — I213 ST elevation (STEMI) myocardial infarction of unspecified site: Secondary | ICD-10-CM | POA: Diagnosis not present

## 2014-11-08 DIAGNOSIS — Z48812 Encounter for surgical aftercare following surgery on the circulatory system: Secondary | ICD-10-CM | POA: Diagnosis not present

## 2014-11-08 DIAGNOSIS — I480 Paroxysmal atrial fibrillation: Secondary | ICD-10-CM | POA: Diagnosis not present

## 2014-11-08 DIAGNOSIS — I5022 Chronic systolic (congestive) heart failure: Secondary | ICD-10-CM | POA: Diagnosis not present

## 2014-11-08 DIAGNOSIS — Z8673 Personal history of transient ischemic attack (TIA), and cerebral infarction without residual deficits: Secondary | ICD-10-CM | POA: Diagnosis not present

## 2014-11-08 NOTE — Telephone Encounter (Signed)
Please order HHA and OT. Thank you

## 2014-11-08 NOTE — Telephone Encounter (Signed)
[name] calling to get INR Results 3.4 and PT was 40.6 and is on 7.6 coumadin daily

## 2014-11-08 NOTE — Telephone Encounter (Signed)
Requesting orders for home health aid and Occupational therapy

## 2014-11-08 NOTE — Telephone Encounter (Signed)
Talked with Donnita with [facility] about home health aid and OT. Will send to PCP for orders. Hilda Blades Castor Gittleman RN 11/08/14 2:20PM

## 2014-11-08 NOTE — Telephone Encounter (Signed)
Dr Maudie Mercury states she will address INR and Coumadin.

## 2014-11-09 ENCOUNTER — Inpatient Hospital Stay (HOSPITAL_COMMUNITY)
Admission: AD | Admit: 2014-11-09 | Discharge: 2014-11-17 | DRG: 308 | Disposition: A | Discharge status: Skilled Nursing Facility | Payer: Commercial Managed Care - HMO | Source: Ambulatory Visit | Attending: Oncology | Admitting: Oncology

## 2014-11-09 ENCOUNTER — Ambulatory Visit (INDEPENDENT_AMBULATORY_CARE_PROVIDER_SITE_OTHER): Payer: Commercial Managed Care - HMO | Admitting: Internal Medicine

## 2014-11-09 ENCOUNTER — Encounter: Payer: Self-pay | Admitting: Internal Medicine

## 2014-11-09 ENCOUNTER — Other Ambulatory Visit: Payer: Self-pay | Admitting: *Deleted

## 2014-11-09 ENCOUNTER — Observation Stay (HOSPITAL_COMMUNITY): Payer: Commercial Managed Care - HMO

## 2014-11-09 ENCOUNTER — Ambulatory Visit: Payer: Commercial Managed Care - HMO | Admitting: Pharmacist

## 2014-11-09 ENCOUNTER — Encounter (HOSPITAL_COMMUNITY): Payer: Self-pay | Admitting: General Practice

## 2014-11-09 VITALS — BP 99/76 | HR 63 | Temp 98.3°F | Ht 75.0 in | Wt 147.5 lb

## 2014-11-09 DIAGNOSIS — E785 Hyperlipidemia, unspecified: Secondary | ICD-10-CM | POA: Diagnosis present

## 2014-11-09 DIAGNOSIS — N179 Acute kidney failure, unspecified: Secondary | ICD-10-CM | POA: Diagnosis not present

## 2014-11-09 DIAGNOSIS — I252 Old myocardial infarction: Secondary | ICD-10-CM

## 2014-11-09 DIAGNOSIS — F419 Anxiety disorder, unspecified: Secondary | ICD-10-CM | POA: Diagnosis present

## 2014-11-09 DIAGNOSIS — I482 Chronic atrial fibrillation: Secondary | ICD-10-CM | POA: Diagnosis not present

## 2014-11-09 DIAGNOSIS — Z87891 Personal history of nicotine dependence: Secondary | ICD-10-CM

## 2014-11-09 DIAGNOSIS — J80 Acute respiratory distress syndrome: Secondary | ICD-10-CM | POA: Diagnosis not present

## 2014-11-09 DIAGNOSIS — I10 Essential (primary) hypertension: Secondary | ICD-10-CM | POA: Diagnosis present

## 2014-11-09 DIAGNOSIS — I5042 Chronic combined systolic (congestive) and diastolic (congestive) heart failure: Secondary | ICD-10-CM | POA: Diagnosis not present

## 2014-11-09 DIAGNOSIS — Z8673 Personal history of transient ischemic attack (TIA), and cerebral infarction without residual deficits: Secondary | ICD-10-CM

## 2014-11-09 DIAGNOSIS — A419 Sepsis, unspecified organism: Secondary | ICD-10-CM | POA: Diagnosis not present

## 2014-11-09 DIAGNOSIS — K219 Gastro-esophageal reflux disease without esophagitis: Secondary | ICD-10-CM | POA: Diagnosis present

## 2014-11-09 DIAGNOSIS — K5900 Constipation, unspecified: Secondary | ICD-10-CM | POA: Diagnosis present

## 2014-11-09 DIAGNOSIS — R7989 Other specified abnormal findings of blood chemistry: Secondary | ICD-10-CM | POA: Diagnosis not present

## 2014-11-09 DIAGNOSIS — Z48812 Encounter for surgical aftercare following surgery on the circulatory system: Secondary | ICD-10-CM | POA: Diagnosis not present

## 2014-11-09 DIAGNOSIS — R000 Tachycardia, unspecified: Secondary | ICD-10-CM | POA: Diagnosis not present

## 2014-11-09 DIAGNOSIS — I4891 Unspecified atrial fibrillation: Secondary | ICD-10-CM | POA: Diagnosis present

## 2014-11-09 DIAGNOSIS — J449 Chronic obstructive pulmonary disease, unspecified: Secondary | ICD-10-CM | POA: Diagnosis present

## 2014-11-09 DIAGNOSIS — Z950 Presence of cardiac pacemaker: Secondary | ICD-10-CM | POA: Diagnosis not present

## 2014-11-09 DIAGNOSIS — I69123 Fluency disorder following nontraumatic intracerebral hemorrhage: Secondary | ICD-10-CM | POA: Diagnosis not present

## 2014-11-09 DIAGNOSIS — B9689 Other specified bacterial agents as the cause of diseases classified elsewhere: Secondary | ICD-10-CM | POA: Diagnosis not present

## 2014-11-09 DIAGNOSIS — R7881 Bacteremia: Secondary | ICD-10-CM | POA: Diagnosis present

## 2014-11-09 DIAGNOSIS — I5022 Chronic systolic (congestive) heart failure: Secondary | ICD-10-CM | POA: Diagnosis not present

## 2014-11-09 DIAGNOSIS — G8929 Other chronic pain: Secondary | ICD-10-CM | POA: Diagnosis present

## 2014-11-09 DIAGNOSIS — N189 Chronic kidney disease, unspecified: Secondary | ICD-10-CM | POA: Diagnosis present

## 2014-11-09 DIAGNOSIS — E43 Unspecified severe protein-calorie malnutrition: Secondary | ICD-10-CM | POA: Diagnosis not present

## 2014-11-09 DIAGNOSIS — Z960 Presence of urogenital implants: Secondary | ICD-10-CM | POA: Diagnosis not present

## 2014-11-09 DIAGNOSIS — E0590 Thyrotoxicosis, unspecified without thyrotoxic crisis or storm: Secondary | ICD-10-CM | POA: Diagnosis present

## 2014-11-09 DIAGNOSIS — Z86718 Personal history of other venous thrombosis and embolism: Secondary | ICD-10-CM

## 2014-11-09 DIAGNOSIS — Z681 Body mass index (BMI) 19 or less, adult: Secondary | ICD-10-CM

## 2014-11-09 DIAGNOSIS — Z7901 Long term (current) use of anticoagulants: Secondary | ICD-10-CM | POA: Diagnosis not present

## 2014-11-09 DIAGNOSIS — I739 Peripheral vascular disease, unspecified: Secondary | ICD-10-CM | POA: Diagnosis not present

## 2014-11-09 DIAGNOSIS — E875 Hyperkalemia: Secondary | ICD-10-CM | POA: Diagnosis present

## 2014-11-09 DIAGNOSIS — J441 Chronic obstructive pulmonary disease with (acute) exacerbation: Secondary | ICD-10-CM | POA: Diagnosis present

## 2014-11-09 DIAGNOSIS — R2681 Unsteadiness on feet: Secondary | ICD-10-CM | POA: Diagnosis not present

## 2014-11-09 DIAGNOSIS — N183 Chronic kidney disease, stage 3 (moderate): Secondary | ICD-10-CM | POA: Diagnosis not present

## 2014-11-09 DIAGNOSIS — I639 Cerebral infarction, unspecified: Secondary | ICD-10-CM | POA: Diagnosis not present

## 2014-11-09 DIAGNOSIS — F319 Bipolar disorder, unspecified: Secondary | ICD-10-CM | POA: Diagnosis present

## 2014-11-09 DIAGNOSIS — I481 Persistent atrial fibrillation: Secondary | ICD-10-CM | POA: Diagnosis not present

## 2014-11-09 DIAGNOSIS — A4151 Sepsis due to Escherichia coli [E. coli]: Secondary | ICD-10-CM | POA: Diagnosis not present

## 2014-11-09 DIAGNOSIS — B9620 Unspecified Escherichia coli [E. coli] as the cause of diseases classified elsewhere: Secondary | ICD-10-CM | POA: Diagnosis present

## 2014-11-09 DIAGNOSIS — Z880 Allergy status to penicillin: Secondary | ICD-10-CM | POA: Diagnosis not present

## 2014-11-09 DIAGNOSIS — R42 Dizziness and giddiness: Secondary | ICD-10-CM | POA: Diagnosis present

## 2014-11-09 DIAGNOSIS — R509 Fever, unspecified: Secondary | ICD-10-CM | POA: Diagnosis not present

## 2014-11-09 DIAGNOSIS — R0602 Shortness of breath: Secondary | ICD-10-CM

## 2014-11-09 DIAGNOSIS — R1312 Dysphagia, oropharyngeal phase: Secondary | ICD-10-CM | POA: Diagnosis not present

## 2014-11-09 DIAGNOSIS — Z96643 Presence of artificial hip joint, bilateral: Secondary | ICD-10-CM | POA: Diagnosis present

## 2014-11-09 DIAGNOSIS — Z9981 Dependence on supplemental oxygen: Secondary | ICD-10-CM

## 2014-11-09 DIAGNOSIS — N390 Urinary tract infection, site not specified: Secondary | ICD-10-CM | POA: Diagnosis present

## 2014-11-09 DIAGNOSIS — M6281 Muscle weakness (generalized): Secondary | ICD-10-CM | POA: Diagnosis not present

## 2014-11-09 DIAGNOSIS — N184 Chronic kidney disease, stage 4 (severe): Secondary | ICD-10-CM | POA: Diagnosis not present

## 2014-11-09 DIAGNOSIS — E873 Alkalosis: Secondary | ICD-10-CM | POA: Diagnosis not present

## 2014-11-09 DIAGNOSIS — I213 ST elevation (STEMI) myocardial infarction of unspecified site: Secondary | ICD-10-CM | POA: Diagnosis not present

## 2014-11-09 DIAGNOSIS — R778 Other specified abnormalities of plasma proteins: Secondary | ICD-10-CM | POA: Diagnosis present

## 2014-11-09 DIAGNOSIS — D696 Thrombocytopenia, unspecified: Secondary | ICD-10-CM | POA: Diagnosis not present

## 2014-11-09 DIAGNOSIS — I129 Hypertensive chronic kidney disease with stage 1 through stage 4 chronic kidney disease, or unspecified chronic kidney disease: Secondary | ICD-10-CM | POA: Diagnosis present

## 2014-11-09 DIAGNOSIS — D72829 Elevated white blood cell count, unspecified: Secondary | ICD-10-CM | POA: Diagnosis not present

## 2014-11-09 DIAGNOSIS — R2689 Other abnormalities of gait and mobility: Secondary | ICD-10-CM | POA: Diagnosis not present

## 2014-11-09 DIAGNOSIS — Z8639 Personal history of other endocrine, nutritional and metabolic disease: Secondary | ICD-10-CM

## 2014-11-09 DIAGNOSIS — R278 Other lack of coordination: Secondary | ICD-10-CM | POA: Diagnosis not present

## 2014-11-09 DIAGNOSIS — I480 Paroxysmal atrial fibrillation: Secondary | ICD-10-CM | POA: Diagnosis not present

## 2014-11-09 HISTORY — DX: Unspecified atrial fibrillation: I48.91

## 2014-11-09 LAB — POCT INR: INR: 1.9

## 2014-11-09 LAB — BASIC METABOLIC PANEL
Anion gap: 10 (ref 5–15)
BUN: 35 mg/dL — ABNORMAL HIGH (ref 6–20)
CALCIUM: 9.5 mg/dL (ref 8.9–10.3)
CO2: 26 mmol/L (ref 22–32)
Chloride: 103 mmol/L (ref 101–111)
Creatinine, Ser: 1.81 mg/dL — ABNORMAL HIGH (ref 0.61–1.24)
GFR calc Af Amer: 42 mL/min — ABNORMAL LOW (ref >60)
GFR calc non Af Amer: 36 mL/min — ABNORMAL LOW (ref >60)
Glucose, Bld: 97 mg/dL (ref 65–99)
Potassium: 5.5 mmol/L — ABNORMAL HIGH (ref 3.5–5.1)
Sodium: 139 mmol/L (ref 135–145)

## 2014-11-09 LAB — TROPONIN I
TROPONIN I: 0.04 ng/mL — AB (ref <0.031)
Troponin I: 0.03 ng/mL (ref <0.031)

## 2014-11-09 LAB — DIGOXIN LEVEL: Digoxin Level: 0.3 ng/mL — ABNORMAL LOW (ref 0.8–2.0)

## 2014-11-09 MED ORDER — SENNOSIDES-DOCUSATE SODIUM 8.6-50 MG PO TABS
1.0000 | ORAL_TABLET | Freq: Every day | ORAL | Status: DC | PRN
  Administered 2014-11-10 – 2014-11-11 (×2): 1 via ORAL
  Filled 2014-11-09 (×3): qty 1

## 2014-11-09 MED ORDER — APIXABAN 5 MG PO TABS
5.0000 mg | ORAL_TABLET | Freq: Two times a day (BID) | ORAL | Status: DC
  Administered 2014-11-09 – 2014-11-17 (×16): 5 mg via ORAL
  Filled 2014-11-09 (×17): qty 1

## 2014-11-09 MED ORDER — ALBUTEROL SULFATE (2.5 MG/3ML) 0.083% IN NEBU
2.5000 mg | INHALATION_SOLUTION | RESPIRATORY_TRACT | Status: DC | PRN
  Administered 2014-11-09 – 2014-11-10 (×2): 2.5 mg via RESPIRATORY_TRACT
  Filled 2014-11-09 (×2): qty 3

## 2014-11-09 MED ORDER — OXYCODONE HCL 5 MG PO TABS
5.0000 mg | ORAL_TABLET | ORAL | Status: DC | PRN
  Administered 2014-11-11 (×2): 5 mg via ORAL
  Filled 2014-11-09 (×4): qty 1

## 2014-11-09 MED ORDER — BUPROPION HCL 75 MG PO TABS
75.0000 mg | ORAL_TABLET | Freq: Two times a day (BID) | ORAL | Status: DC
  Administered 2014-11-09 – 2014-11-17 (×16): 75 mg via ORAL
  Filled 2014-11-09 (×17): qty 1

## 2014-11-09 MED ORDER — SODIUM POLYSTYRENE SULFONATE 15 GM/60ML PO SUSP: 15.0000 g | Freq: Once | ORAL | Status: DC

## 2014-11-09 MED ORDER — OXYCODONE-ACETAMINOPHEN 10-325 MG PO TABS: 1.0000 | ORAL_TABLET | ORAL | Status: DC | PRN

## 2014-11-09 MED ORDER — ALBUTEROL SULFATE HFA 108 (90 BASE) MCG/ACT IN AERS: 2.0000 | INHALATION_SPRAY | Freq: Four times a day (QID) | RESPIRATORY_TRACT | Status: DC | PRN

## 2014-11-09 MED ORDER — TIOTROPIUM BROMIDE MONOHYDRATE 18 MCG IN CAPS
18.0000 ug | ORAL_CAPSULE | Freq: Every day | RESPIRATORY_TRACT | Status: DC
  Administered 2014-11-10 – 2014-11-17 (×7): 18 ug via RESPIRATORY_TRACT
  Filled 2014-11-09 (×2): qty 5

## 2014-11-09 MED ORDER — OXYCODONE-ACETAMINOPHEN 5-325 MG PO TABS
1.0000 | ORAL_TABLET | ORAL | Status: DC | PRN
  Administered 2014-11-10 – 2014-11-11 (×3): 1 via ORAL
  Filled 2014-11-09 (×4): qty 1

## 2014-11-09 MED ORDER — METOPROLOL SUCCINATE ER 100 MG PO TB24
200.0000 mg | ORAL_TABLET | Freq: Every day | ORAL | Status: DC
  Administered 2014-11-09: 200 mg via ORAL
  Filled 2014-11-09 (×2): qty 2

## 2014-11-09 MED ORDER — ROSUVASTATIN CALCIUM 20 MG PO TABS
20.0000 mg | ORAL_TABLET | Freq: Every day | ORAL | Status: DC
  Administered 2014-11-09 – 2014-11-16 (×7): 20 mg via ORAL
  Filled 2014-11-09 (×10): qty 1

## 2014-11-09 MED ORDER — ALBUTEROL SULFATE (2.5 MG/3ML) 0.083% IN NEBU: 2.5000 mg | INHALATION_SOLUTION | Freq: Four times a day (QID) | RESPIRATORY_TRACT | Status: DC | PRN

## 2014-11-09 MED ORDER — ENSURE ENLIVE PO LIQD
237.0000 mL | Freq: Two times a day (BID) | ORAL | Status: DC
Start: 2014-11-10 — End: 2014-11-17
  Administered 2014-11-10 – 2014-11-17 (×11): 237 mL via ORAL

## 2014-11-09 MED ORDER — RAMELTEON 8 MG PO TABS
8.0000 mg | ORAL_TABLET | Freq: Every evening | ORAL | Status: DC | PRN
  Administered 2014-11-09 – 2014-11-10 (×2): 8 mg via ORAL
  Filled 2014-11-09 (×3): qty 1

## 2014-11-09 MED ORDER — PANTOPRAZOLE SODIUM 40 MG PO TBEC
40.0000 mg | DELAYED_RELEASE_TABLET | Freq: Every day | ORAL | Status: DC
  Administered 2014-11-11 – 2014-11-17 (×7): 40 mg via ORAL
  Filled 2014-11-09 (×6): qty 1

## 2014-11-09 MED ORDER — DIGOXIN 0.0625 MG HALF TABLET
0.0625 mg | ORAL_TABLET | Freq: Every day | ORAL | Status: DC
  Administered 2014-11-10 – 2014-11-17 (×8): 0.0625 mg via ORAL
  Filled 2014-11-09 (×9): qty 1

## 2014-11-09 NOTE — Progress Notes (Signed)
MD paged, pt request to have regular diet or he will leave AMA. [name] 7:16 PM

## 2014-11-09 NOTE — Patient Outreach (Signed)
[facility]) Care Management  11/09/2014  MACRAE WIEGMAN 1946/04/14 [id number]  Covering social worker placed phone call to patient to assess for social work needs (mobile meals referral and needed bathroom repairs) .  Spoke to patient's daughter who informed this Probation officer that patient has been re-admitted to the hospital.  This social worker will inform assigned Education officer, museum.   Sheralyn Boatman [facility] Care Management [phone]

## 2014-11-09 NOTE — Progress Notes (Addendum)
MD paged for order for new patient. 5:27 PM   Resident paged. 5:35 PM

## 2014-11-09 NOTE — Progress Notes (Signed)
Report called to [facility] on [address].  Patient alert and oriented.  Transported via wheelchair to [place] room 16.  Sander Nephew, RN 11/09/2014 4:58 PM

## 2014-11-09 NOTE — Progress Notes (Signed)
ANTICOAGULATION CONSULT NOTE - Initial Consult  Pharmacy Consult for Apixaban Indication: atrial fibrillation  Allergies  Allergen Reactions  . Digoxin And Related Other (See Comments)    Dig toxicity 2009  . Penicillins Rash    Patient Measurements:   Heparin Dosing Weight:   Vital Signs: Temp: 98 F (36.7 C) (06/22 1730) Temp Source: Oral (06/22 1730) BP: 122/94 mmHg (06/22 1730) Pulse Rate: 130 (06/22 1730)  Labs:  Recent Labs  11/09/14 1620 11/09/14 1628  INR  --  1.9  CREATININE 1.81*  --   TROPONINI 0.04*  --     Estimated Creatinine Clearance: 35.9 mL/min (by C-G formula based on Cr of 1.81).   Medical History: Past Medical History  Diagnosis Date  . Hypertension   . Chronic systolic heart failure     a. NICM - EF 35% with normal cors 2003. b. Last echo 11/2012 - EF 25-30%.  . Depression   . GERD (gastroesophageal reflux disease)   . Portal vein thrombosis   . Avascular necrosis of bones of both hips     Status post bilateral hip replacements  . Gastritis   . Alcohol abuse   . Erectile dysfunction   . Bipolar disorder   . Hydrocele, unspecified   . Intermittent claudication   . Lung nodule     Chest CT scan on 12/14/2008 showed a nodular opacity at the left lung base felt to most likely represent scarring.  Follow-up chest CT scan on 06/02/2010 showed parenchymal scarring in the left apex, left  lower lobe, and lingula; some of this scarring at the left base had a nodular appearance, unchanged. Chest 09/2012 - stable.  . 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  Thyroid scan 08/28/2009 was normal with no focal areas of abnormal increased or decreased activity seen; the uptake of I 131 sodium iodide at 24 hours was 5.7%.  TSH and free T4 normalized by 08/16/2009.  . Intestinal obstruction   . COPD (chronic obstructive pulmonary disease)   . Clostridium difficile colitis   . E coli bacteremia   . DVT (deep  venous thrombosis)     He has hypercoagulability with multiple prior DVTs  . Pleural plaque     H/o asbestos exposure. Chest CT on 06/02/2010 showed stable extensive calcified pleural plaques involving the left hemithorax, consistent with asbestos related pleural disease.  . Weight loss     Normal colonoscopy by Dr. Olevia Perches on 11/06/2010.  . Tachycardia-bradycardia syndrome     a. 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 b. [employer] gen change 2010.  [name] PAF (paroxysmal atrial fibrillation)     Paroxysmal, failed medical therapy with amiodarone but has done very well with multaq;  d/c 2/2 to recurrent AFib;  Tikosyn load c/b Hyperkalemia during admx 05/2013 => rate control strategy  . 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  . Noncompliance   . Thrombocytopenia   . Osteoarthritis   . Spondylosis   . Anxiety   . CKD (chronic kidney disease)     Renal U/S 12/04/2009 showed no pathological findings. Labs 12/04/2009 include normal ESR, C3, C4; neg ANA; SPEP showed nonspecific increase in the alpha-2 region with no M-spike; UPEP showed no monoclonal free light chains; urine IFE showed polyclonal increase in feree Kappa and/or free Lambda light chains. Baseline Cr reported 1.7-2.5.  . STEMI (ST elevation myocardial infarction)  10/11/2014  . Hyperlipemia   . CHF (congestive heart failure)   . On home oxygen therapy     "3L prn" (10/24/2014)    Medications:  Prescriptions prior to admission  Medication Sig Dispense Refill Last Dose  . albuterol (VENTOLIN HFA) 108 (90 BASE) MCG/ACT inhaler Inhale 2 puffs into the lungs every 6 (six) hours as needed for wheezing or shortness of breath. 1 Inhaler 3 Taking  . AMBULATORY NON FORMULARY MEDICATION Medication Name: Med Pass 120 mL by mouth daily   Taking  . buPROPion (WELLBUTRIN) 75 MG tablet Take 1 tablet (75 mg total) by mouth 2 (two) times daily. 60 tablet 5  Taking  . digoxin 62.5 MCG TABS Take 0.0625 mg by mouth daily. 30 tablet 2 Taking  . diltiazem (CARDIZEM CD) 240 MG 24 hr capsule TAKE ONE CAPSULE BY MOUTH TWICE A DAY 60 capsule 1   . Melatonin 3 MG CAPS Take by mouth at bedtime. For Insomnia   Taking  . metoprolol (TOPROL-XL) 200 MG 24 hr tablet Take 1 tablet (200 mg total) by mouth daily. Take with or immediately following a meal. 30 tablet 11 Taking  . omeprazole (PRILOSEC) 40 MG capsule Take 40 mg by mouth daily.   Taking  . oxyCODONE-acetaminophen (PERCOCET) 10-325 MG per tablet Take 1 tablet by mouth every 4 (four) hours as needed for pain. Do not take more than 5 doses in a 24 hour period. 150 tablet 0   . rosuvastatin (CRESTOR) 20 MG tablet Take 1 tablet (20 mg total) by mouth daily at 6 PM. 30 tablet 2 Taking  . senna-docusate ([employer] SENNA PLUS) 8.6-50 MG per tablet Take 1 tablet by mouth daily as needed (for constipation). 30 tablet 1 Taking  . tiotropium (SPIRIVA) 18 MCG inhalation capsule Place 1 capsule (18 mcg total) into inhaler and inhale daily. 30 capsule 5 Taking  . warfarin (COUMADIN) 2.5 MG tablet Take 3 tablets (7.5 mg total) by mouth See admin instructions. Take as directed by Dr. Elie Confer 30 tablet 2    Scheduled:  . buPROPion  75 mg Oral BID  . digoxin  0.0625 mg Oral Daily  . metoprolol  200 mg Oral Daily  . pantoprazole  40 mg Oral Daily  . rosuvastatin  20 mg Oral q1800  . tiotropium  18 mcg Inhalation Daily   Infusions:    Assessment: 69yo male with history of Afib, HTN, and CHF presents in Afib with RVR. Pharmacy is consulted to transition patient from warfarin (non-compliance) to apixaban for atrial fibrillation. INR is slightly subtherapeutic, sCr 1.8, CBC pending.  Goal of Therapy:  Monitor platelets by anticoagulation protocol: Yes   Plan:  Apixaban 58m PO BID Daily CBC Monitor s/sx of bleeding Educate pt on apixaban  CAndrey Cota BDiona Foley PharmD Clinical Pharmacist Pager [phone]/22/2016,5:46 PM

## 2014-11-09 NOTE — Assessment & Plan Note (Addendum)
Patient recently discharged from hospital, went to SNF, now home. Since his discharge from SNF, has had issues w/ chest pressure, SOB, dizziness, lightheadedness, and palpitations w/ minimal movement. Lungs clear on exam, no LE edema. EKG significant for rate of 138-144, appears to be atrial fibrillation w/ RVR. Has been taking Toprol-XL 200 mg daily + Digoxin 0.0625 mg daily. Has Cardizem CD 240 mg bid on his medlist, apparently ordered by ?Dr. Rayann Heman, per chart review, however, patient was taken off Cardizem some time ago, and would assume he is not to be on both Cardizem + Toprol. Patient does not know about the Cardizem and has not seen Dr. Rayann Heman since his discharge. Scheduled to follow up w/ him on 11/16/14. INR 1.9 today. -Given RVR and symptoms of SOB, palpitations, and chest pressure, will have patient admitted to telemetry under the care of IMTS.  -BMP, troponin x1 pending

## 2014-11-09 NOTE — Telephone Encounter (Signed)
Talked with Donnita about Dr Dareen Piano answer for [facility] and OT. Aware Dr Maudie Mercury told him to hold Coumadin and she will see pt 11/09/14 at 2:30PM

## 2014-11-09 NOTE — Assessment & Plan Note (Signed)
K of 5.7 at SNF.  -BMP pending

## 2014-11-09 NOTE — Progress Notes (Signed)
Subjective:   Patient ID: [name] male   DOB: 08-15-1945 69 y.o.   MRN: [id number]  HPI: [name] is a 69 y.o. male w/ PMHx of HTN, HLD, chronic sCHF, GERD, hyperthyroidism, COPD, h/o DVT/PE and recent LLE arterial embolus on Coumadin (h/o non-compliance), atrial fibrillation, and recent TIA, presents to the clinic today for a hospital follow-up visit. Patient was discharged from SNF within the past few days, since that time, states he has felt significant palpitations, chest pressure, and SOB w/ minimal movement. Also admits to recent associated dizziness and lightheadedness. States he has been compliant w/ his medications since he left SNF, however, he has had issues w/ compliance in the past. He was discharged from the hospital on Toprol-XL 200 mg daily + Digoxin 0.0625 mg daily. Per SNF notes, he was rate controlled as of 11/04/14 as well as when he was discharged from the hospital (90's).   On exam, patient mildly tachypneic, tachycardic into the 140's.    Current Outpatient Prescriptions  Medication Sig Dispense Refill  . albuterol (VENTOLIN HFA) 108 (90 BASE) MCG/ACT inhaler Inhale 2 puffs into the lungs every 6 (six) hours as needed for wheezing or shortness of breath. 1 Inhaler 3  . AMBULATORY NON FORMULARY MEDICATION Medication Name: Med Pass 120 mL by mouth daily    . buPROPion (WELLBUTRIN) 75 MG tablet Take 1 tablet (75 mg total) by mouth 2 (two) times daily. 60 tablet 5  . digoxin 62.5 MCG TABS Take 0.0625 mg by mouth daily. 30 tablet 2  . diltiazem (CARDIZEM CD) 240 MG 24 hr capsule TAKE ONE CAPSULE BY MOUTH TWICE A DAY 60 capsule 1  . Melatonin 3 MG CAPS Take by mouth at bedtime. For Insomnia    . metoprolol (TOPROL-XL) 200 MG 24 hr tablet Take 1 tablet (200 mg total) by mouth daily. Take with or immediately following a meal. 30 tablet 11  . omeprazole (PRILOSEC) 40 MG capsule Take 40 mg by mouth daily.    [name] oxyCODONE-acetaminophen (PERCOCET) 10-325 MG per tablet  Take 1 tablet by mouth every 4 (four) hours as needed for pain. Do not take more than 5 doses in a 24 hour period. 150 tablet 0  . rosuvastatin (CRESTOR) 20 MG tablet Take 1 tablet (20 mg total) by mouth daily at 6 PM. 30 tablet 2  . senna-docusate ([employer] SENNA PLUS) 8.6-50 MG per tablet Take 1 tablet by mouth daily as needed (for constipation). 30 tablet 1  . tiotropium (SPIRIVA) 18 MCG inhalation capsule Place 1 capsule (18 mcg total) into inhaler and inhale daily. 30 capsule 5  . warfarin (COUMADIN) 2.5 MG tablet Take 3 tablets (7.5 mg total) by mouth See admin instructions. Take as directed by Dr. Elie Confer 30 tablet 2   No current facility-administered medications for this visit.   Review of Systems  General: Positive for decreased appetite and fatigue. Denies fever, diaphoresis. Respiratory: Positive for SOB. Denies cough, and wheezing.   Cardiovascular: Positive for chest pressure and palpitations. Denies chest pain.  Gastrointestinal: Denies nausea, vomiting, abdominal pain, and diarrhea Musculoskeletal: Denies myalgias, arthralgias, back pain, and gait problem.  Neurological: Positive for dizziness and lightheadedness. Denies syncope, weakness, and headaches.  Psychiatric/Behavioral: Denies mood changes, sleep disturbance, and agitation.   Objective:   Physical Exam: Filed Vitals:   11/09/14 1510  BP: 99/76  Pulse: 63 (per machine) 140 on exam  Temp: 98.3 F (36.8 C)  TempSrc: Oral  Height: 6\' 3"  (1.905 m)  Weight: 147 lb 8 oz (66.906 kg)  SpO2: 99%    General: AA male, alert, cooperative, NAD. HEENT: PERRL, EOMI. Moist mucus membranes Neck: Full range of motion without pain, supple, no lymphadenopathy or carotid bruits Lungs: Clear to ascultation bilaterally, normal work of respiration, no wheezes, rales, rhonchi. Mild SOB at rest.  Heart: Tachycardic, irregular, no murmurs, gallops, or rubs Abdomen: Soft, non-tender, non-distended, BS + Extremities: No cyanosis, clubbing,  or edema Neurologic: Alert & oriented x3, cranial nerves II-XII intact, strength grossly intact, sensation intact to light touch   Assessment & Plan:   Please see problem based assessment and plan.

## 2014-11-09 NOTE — H&P (Signed)
Date: 11/09/2014               Patient Name:  [name] MRN: [id number]  DOB: [birth year] Age / Sex: 69 y.o., male   PCP: Aldine Contes, MD         Medical Service: Internal Medicine Teaching Service         Attending Physician: Dr. Annia Belt, MD    First Contact: Betsey Amen, MS4 Pager: 4-   Second Contact: Dr. Naaman Plummer  Pager: [phone]        After Hours (After 5p/  First Contact Pager: [phone]  weekends / holidays): Second Contact Pager: [phone]   Chief Complaint: Dizziness, headache  History of Present Illness: [name] is a 69 year old male with chronic systolic heart failure, chronic atrial fibrillation on oral anticoagulation, hypertension, hyperlipidemia, GERD, depression who presented to clinic with dizziness, headache.  2 days ago, he reported the onset of dizziness, headache similar to the presentation and prompted his most recent hospitalization to denied any "pounding in his chest." He attempted to use the albuterol inhaler though without relief of his symptoms. He reported his symptoms worsened until today when he could not get out of bed and felt short of breath while trying to take a shower. He presented to his hospital follow-up earlier today with Dr. Ronnald Ramp, and EKG was notable for atrial fibrillation with RVR. During this interval, he reported chest pain though he felt it was associated with something he ate since it was of a burning nature but denied any fever, chills, sick contacts, abdominal pain, nausea. He was at home with his 2 sons and smoked a pack per day until about a month ago following his most recent hospitalizations denies any illicit drug use.  He was hospitalized 5/24-6/1 for persistent atrial fibrillation with RVR with subsequent embolization in the setting of nonadherence to oral anticoagulation that resulted in STEMI, TIA, lower extremity ischemia and required embolectomy. He was noted to have EF 30-35% with diffuse hypokinesis on echo  during that admission  He was rehospitalized earlier this month [6/6-6/9] again for atrial fibrillation with RVR at which time Lopressor 75 mg 3 times daily was changed to Toprol-XL 200 mg daily though he was continued on digoxin 0.0625 mg. Ramipril was discontinued given low blood pressure, and he was to follow-up with Dr. Rayann Heman on 6/29.    Meds: Current Facility-Administered Medications  Medication Dose Route Frequency Provider Last Rate Last Dose  . albuterol (PROVENTIL) (2.5 MG/3ML) 0.083% nebulizer solution 2.5 mg  2.5 mg Nebulization Q6H PRN Jones Bales, MD      . apixaban [facility]) tablet 5 mg  5 mg Oral BID Rebecka Apley, RPH      . buPROPion [facility]) tablet 75 mg  75 mg Oral BID Jones Bales, MD      . digoxin (LANOXIN) tablet 0.0625 mg  0.0625 mg Oral Daily Jones Bales, MD      . metoprolol succinate (TOPROL-XL) 24 hr tablet 200 mg  200 mg Oral Daily Jones Bales, MD      . pantoprazole (PROTONIX) EC tablet 40 mg  40 mg Oral Daily Jones Bales, MD      . ramelteon (ROZEREM) tablet 8 mg  8 mg Oral QHS PRN Jones Bales, MD      . rosuvastatin (CRESTOR) tablet 20 mg  20 mg Oral q1800 Jones Bales, MD      . senna-docusate (Senokot-S) tablet 1 tablet  1 tablet Oral Daily PRN Jones Bales, MD      . tiotropium [facility]) inhalation capsule 18 mcg  18 mcg Inhalation Daily Jones Bales, MD        Allergies: Allergies as of 11/09/2014 - Review Complete 11/09/2014  Allergen Reaction Noted  . Penicillins Rash    Past Medical History  Diagnosis Date  . Hypertension   . Chronic systolic heart failure     a. NICM - EF 35% with normal cors 2003. b. Last echo 11/2012 - EF 25-30%.  . Depression   . GERD (gastroesophageal reflux disease)   . Portal vein thrombosis   . Avascular necrosis of bones of both hips     Status post bilateral hip replacements  . Gastritis   . Alcohol abuse   . Erectile dysfunction   . Bipolar disorder   . Hydrocele,  unspecified   . Intermittent claudication   . Lung nodule     Chest CT scan on 12/14/2008 showed a nodular opacity at the left lung base felt to most likely represent scarring.  Follow-up chest CT scan on 06/02/2010 showed parenchymal scarring in the left apex, left  lower lobe, and lingula; some of this scarring at the left base had a nodular appearance, unchanged. Chest 09/2012 - stable.  . 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  Thyroid scan 08/28/2009 was normal with no focal areas of abnormal increased or decreased activity seen; the uptake of I 131 sodium iodide at 24 hours was 5.7%.  TSH and free T4 normalized by 08/16/2009.  . Intestinal obstruction   . COPD (chronic obstructive pulmonary disease)   . Clostridium difficile colitis   . E coli bacteremia   . DVT (deep venous thrombosis)     He has hypercoagulability with multiple prior DVTs  . Pleural plaque     H/o asbestos exposure. Chest CT on 06/02/2010 showed stable extensive calcified pleural plaques involving the left hemithorax, consistent with asbestos related pleural disease.  . Weight loss     Normal colonoscopy by Dr. Olevia Perches on 11/06/2010.  . Tachycardia-bradycardia syndrome     a. 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 b. St. Jude gen change 2010.  [name] PAF (paroxysmal atrial fibrillation)     Paroxysmal, failed medical therapy with amiodarone but has done very well with multaq;  d/c 2/2 to recurrent AFib;  Tikosyn load c/b Hyperkalemia during admx 05/2013 => rate control strategy  . 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  . Noncompliance   . Thrombocytopenia   . Osteoarthritis   . Spondylosis   . Anxiety   . CKD (chronic kidney disease)     Renal U/S 12/04/2009 showed no pathological findings. Labs 12/04/2009 include normal ESR, C3, C4; neg ANA; SPEP showed nonspecific increase in  the alpha-2 region with no M-spike; UPEP showed no monoclonal free light chains; urine IFE showed polyclonal increase in feree Kappa and/or free Lambda light chains. Baseline Cr reported 1.7-2.5.  . STEMI (ST elevation myocardial infarction) 10/11/2014  . Hyperlipemia   . CHF (congestive heart failure)   . On home oxygen therapy     "3L prn" (10/24/2014)   Past Surgical History  Procedure Laterality Date  . Total hip arthroplasty Right 03/08/2008     By Dr. Hiram Comber  . Tee without cardioversion N/A 05/27/2013    Procedure: TRANSESOPHAGEAL ECHOCARDIOGRAM (TEE);  Surgeon: Candee Furbish, MD;  Location: [facility] ENDOSCOPY;  Service: Cardiovascular;  Laterality: N/A;  . Cardioversion N/A 05/27/2013    Procedure: CARDIOVERSION;  Surgeon: Candee Furbish, MD;  Location: [facility] ENDOSCOPY;  Service: Cardiovascular;  Laterality: N/A;  . Cardiac catheterization  2003    Nl cors, EF 35%  . Embolectomy Right 12/10/2013    Procedure: Right Femoral Embolectomy; Fasciotomy Right Lower Leg with Intraoperative arteriogram;  Surgeon: Rosetta Posner, MD;  Location: [place];  Service: Vascular;  Laterality: Right;  . Intraoperative arteriogram Right 12/10/2013    Procedure: INTRA OPERATIVE ARTERIOGRAM;  Surgeon: Rosetta Posner, MD;  Location: [place];  Service: Vascular;  Laterality: Right;  . Fasciotomy closure Right 12/15/2013    Procedure: LEG FASCIOTOMY CLOSURE;  Surgeon: Rosetta Posner, MD;  Location[facility] OR;  Service: Vascular;  Laterality: Right;  . Embolectomy Left 10/12/2014    Procedure: Left Femoral Thrombectomy with intraoperative arteriogram;  Surgeon: Rosetta Posner, MD;  Location: [facility] OR;  Service: Vascular;  Laterality: Left;  . Hemiarthroplasty hip Left 09/09/2002    bipolar; Dr. Lafayette Dragon  . Joint replacement    . Insert / replace / remove pacemaker  02/2003    Dr. Roe Rutherford, [place] pulse generator identity SR model [id number] serial [id number]; gen change by Greggory Brandy  . Insert / replace / remove pacemaker  06/17/2008     Lead and generator change w/ St. Jude Medical Tendril ST model [id number] (serial  [id number]).        Family History  Problem Relation Age of Onset  . Hypertension Mother   . Cancer Brother     Unsure of type.  . Asthma Mother   . Coronary artery disease Mother   . Colon cancer Neg Hx   . Lung cancer Neg Hx   . Prostate cancer Neg Hx   . Stroke      mother  . Heart attack      brother and sister ?age    History   Social History  . Marital Status: Widowed    Spouse Name: N/A  . Number of Children: 10  . Years of Education: N/A   Occupational History  . RETIRED    Social History Main Topics  . Smoking status: Current Every Day Smoker -- 0.33 packs/day for 54 years    Types: Cigarettes  . Smokeless tobacco: Never Used     Comment: stopped last month  . Alcohol Use: 0.0 oz/week    0 Standard drinks or equivalent per week     Comment: 6/6//2016 "h/o etoh abuse; stopped drinking ~ 2012"  . Drug Use: 1.00 per week    Special: Marijuana     Comment: 10/24/2014 "none since 10/11/2014; did smoke marijuana ~ 1 time/month"  . Sexual Activity: Not Currently   Other Topics Concern  . Not on file   Social History Narrative   Works as a Architect part time   Smoker 1 pack last 1.5 days tobacco, smokes marijuana    Denies EtOH x 4 years (on 10/12/12)   9 kids    From [place]   [place] Veteran    12 grade education              Review of Systems: Poor appetite: Ongoing issue over the past year Numbness/tingling: Baseline in his feet and legs Otherwise as noted in the history of present illness   Physical Exam: Blood pressure 122/94, pulse 130, temperature 98 F (36.7 C),  temperature source Oral, resp. rate 20, SpO2 100 %.  General: Thin elderly African-American male, resting in bed, NAD HEENT: PERRL, EOMI, no scleral icterus, oropharynx clear Cardiac: Irregular rate  Pulm: clear to auscultation bilaterally, no wheezes, rales, or rhonchi Abd: soft, nontender,  nondistended, BS present, bilateral upper thigh scars noted from prior procedure Ext: warm and well perfused, no pedal or tibial edema Neuro: CN II-XII intact, 5/5 upper and lower extremity strength, 2+ grip strength, finger to nose & intact   Lab results: Basic Metabolic Panel:  Recent Labs  11/09/14 1620  NA 139  K 5.5*  CL 103  CO2 26  GLUCOSE 97  BUN 35*  CREATININE 1.81*  CALCIUM 9.5   Cardiac Enzymes:  Recent Labs  11/09/14 1620  TROPONINI 0.04*   Coagulation:  Recent Labs  11/09/14 1628  INR 1.9   Urine Drug Screen: Drugs of Abuse     Component Value Date/Time   LABOPIA NONE DETECTED 10/11/2014 1724   LABOPIA NEGATIVE 12/27/2008 1943   COCAINSCRNUR NONE DETECTED 10/11/2014 1724   COCAINSCRNUR NEG 07/06/2009 2225   LABBENZ NONE DETECTED 10/11/2014 1724   LABBENZ NEG 07/06/2009 2225   AMPHETMU NONE DETECTED 10/11/2014 1724   AMPHETMU NEG 07/06/2009 2225   THCU POSITIVE* 10/11/2014 1724   LABBARB NONE DETECTED 10/11/2014 1724     Other results: EKG: Reviewed and compared with 10/24/14. Atrial fibrillation with RVR LVH  Assessment & Plan by Problem:  Chronic atrial fibrillation with RVR: No overt inciting factor identified for his arrhythmia though it is in the setting of underlying cardiac disease and possible nonadherence to medication given prior history. TSH within normal limits on his most recent hospitalization. No illicit drug use to which she reports. Though he has COPD, does not appear he is having respiratory symptoms which may be contributory. CHADSVASC score 4 which is consistent with a 4.8 % stroke/TIA/embolism. HAS-BLED score 4 which is consistent with 8.7 bleeds per 100 patient-years. Heart rate trending in 120s to 130s. Home medications include Coumadin 7.5 mg for INR goal 2-3, Toprol-XL 200 mg, digoxin 0.0625 mg.  -Continue Toprol-XL and digoxin -Continue Coumadin per pharmacy -Monitor on telemetry  Chronic systolic CHF: Echo in May 2016  notable for EF 30-35% with diffuse hypokinesis, likely in the setting of a STEMI.  -Toprol and digoxin as noted above  GERD: Home medications include Prilosec 40 mg daily. -Continue medications  Hyperlipidemia: Home medications include rosuvastatin 20 mg. -Continue home medications  Depression: Her medications include albuterol 75 mg twice daily. -Continue home medications  Hypertension: Home medications include Toprol XL 200 mg. -Continue medications  Chronic pain: He reports taking Percocet 10/325 mg 4-5 times a day for the pain following his hip procedure. -Continue home medications  Severe malnutrition: At his most recent hospitalization, he was noted at BMI 19.2 with reported 24 pound weight loss over the last 5 months. -Give Ensure twice daily between meals  COPD: Continue albuterol 2 puffs as needed every 6 hours.  #FEN:  -Diet: Regular [patient threatened to leave AMA if he was to give heart healthy]  #DVT prophylaxis: Coumadin per pharmacy  #CODE STATUS: FULL CODE -Defer to [place] and daughter Davy Pique [phone] if patients lacks decision-making capacity -Confirmed with patient on admission    Dispo: Disposition is deferred at this time, awaiting improvement of current medical problems.   The patient does have a current PCP (Aldine Contes, MD) and does need an [facility] hospital follow-up appointment after discharge.  The  patient does not know have transportation limitations that hinder transportation to clinic appointments.  Signed: Riccardo Dubin, MD 11/09/2014, 7:01 PM

## 2014-11-09 NOTE — Telephone Encounter (Signed)
Contacted patient for follow up regarding warfarin dosing. HHN reports INR of 3.4 today. Patient advised to hold warfarin today and follow up tomorrow 11/09/14 in clinic for further instructions. Patient verbalized understanding of instructions by repeating back.

## 2014-11-09 NOTE — Progress Notes (Signed)
MD aware of HR 130-140s, will give scheduled beta blocker when available. 6:46 PM Sherrie Mustache

## 2014-11-09 NOTE — Progress Notes (Signed)
Pt refusing troponin blood draw. Spoke with patient on the phone to explain why he needs blood lab but pt refusing to listen. States he has been  "stuck 6 times already today" states he is not going to get stuck again saying "end of story" and passed the phone away to RN. Will let day team know pt refused blood labs.   Julious Oka, MD Internal Medicine Resident, PGY I [facility] Internal Medicine Program Pager: [phone]

## 2014-11-10 ENCOUNTER — Other Ambulatory Visit: Payer: Self-pay

## 2014-11-10 DIAGNOSIS — Z7951 Long term (current) use of inhaled steroids: Secondary | ICD-10-CM

## 2014-11-10 DIAGNOSIS — I5022 Chronic systolic (congestive) heart failure: Secondary | ICD-10-CM | POA: Diagnosis not present

## 2014-11-10 DIAGNOSIS — Z950 Presence of cardiac pacemaker: Secondary | ICD-10-CM | POA: Diagnosis not present

## 2014-11-10 DIAGNOSIS — E43 Unspecified severe protein-calorie malnutrition: Secondary | ICD-10-CM

## 2014-11-10 DIAGNOSIS — R0602 Shortness of breath: Secondary | ICD-10-CM | POA: Diagnosis not present

## 2014-11-10 DIAGNOSIS — R509 Fever, unspecified: Secondary | ICD-10-CM | POA: Diagnosis not present

## 2014-11-10 DIAGNOSIS — D72829 Elevated white blood cell count, unspecified: Secondary | ICD-10-CM | POA: Diagnosis not present

## 2014-11-10 DIAGNOSIS — Z96643 Presence of artificial hip joint, bilateral: Secondary | ICD-10-CM

## 2014-11-10 DIAGNOSIS — I482 Chronic atrial fibrillation: Secondary | ICD-10-CM

## 2014-11-10 DIAGNOSIS — Z7901 Long term (current) use of anticoagulants: Secondary | ICD-10-CM

## 2014-11-10 DIAGNOSIS — B9620 Unspecified Escherichia coli [E. coli] as the cause of diseases classified elsewhere: Secondary | ICD-10-CM | POA: Diagnosis not present

## 2014-11-10 DIAGNOSIS — J449 Chronic obstructive pulmonary disease, unspecified: Secondary | ICD-10-CM

## 2014-11-10 DIAGNOSIS — E785 Hyperlipidemia, unspecified: Secondary | ICD-10-CM

## 2014-11-10 DIAGNOSIS — N390 Urinary tract infection, site not specified: Secondary | ICD-10-CM | POA: Diagnosis not present

## 2014-11-10 DIAGNOSIS — I213 ST elevation (STEMI) myocardial infarction of unspecified site: Secondary | ICD-10-CM | POA: Diagnosis not present

## 2014-11-10 DIAGNOSIS — Z8673 Personal history of transient ischemic attack (TIA), and cerebral infarction without residual deficits: Secondary | ICD-10-CM | POA: Diagnosis not present

## 2014-11-10 DIAGNOSIS — I129 Hypertensive chronic kidney disease with stage 1 through stage 4 chronic kidney disease, or unspecified chronic kidney disease: Secondary | ICD-10-CM | POA: Diagnosis not present

## 2014-11-10 DIAGNOSIS — G8929 Other chronic pain: Secondary | ICD-10-CM

## 2014-11-10 DIAGNOSIS — I428 Other cardiomyopathies: Secondary | ICD-10-CM

## 2014-11-10 DIAGNOSIS — K219 Gastro-esophageal reflux disease without esophagitis: Secondary | ICD-10-CM

## 2014-11-10 DIAGNOSIS — I480 Paroxysmal atrial fibrillation: Secondary | ICD-10-CM | POA: Diagnosis not present

## 2014-11-10 DIAGNOSIS — A4151 Sepsis due to Escherichia coli [E. coli]: Secondary | ICD-10-CM | POA: Diagnosis not present

## 2014-11-10 DIAGNOSIS — F329 Major depressive disorder, single episode, unspecified: Secondary | ICD-10-CM

## 2014-11-10 DIAGNOSIS — Z960 Presence of urogenital implants: Secondary | ICD-10-CM | POA: Diagnosis not present

## 2014-11-10 DIAGNOSIS — Z48812 Encounter for surgical aftercare following surgery on the circulatory system: Secondary | ICD-10-CM | POA: Diagnosis not present

## 2014-11-10 LAB — BLOOD GAS, ARTERIAL
ACID-BASE DEFICIT: 4.1 mmol/L — AB (ref 0.0–2.0)
BICARBONATE: 19.5 meq/L — AB (ref 20.0–24.0)
DRAWN BY: 42624
O2 Content: 2 L/min
O2 SAT: 98.3 %
PCO2 ART: 29.6 mmHg — AB (ref 35.0–45.0)
PO2 ART: 112 mmHg — AB (ref 80.0–100.0)
Patient temperature: 98.6
TCO2: 20.4 mmol/L (ref 0–100)
pH, Arterial: 7.434 (ref 7.350–7.450)

## 2014-11-10 LAB — BASIC METABOLIC PANEL
Anion gap: 9 (ref 5–15)
BUN: 40 mg/dL — ABNORMAL HIGH (ref 6–20)
CALCIUM: 9.1 mg/dL (ref 8.9–10.3)
CHLORIDE: 107 mmol/L (ref 101–111)
CO2: 24 mmol/L (ref 22–32)
Creatinine, Ser: 1.77 mg/dL — ABNORMAL HIGH (ref 0.61–1.24)
GFR calc Af Amer: 43 mL/min — ABNORMAL LOW (ref >60)
GFR calc non Af Amer: 37 mL/min — ABNORMAL LOW (ref >60)
Glucose, Bld: 140 mg/dL — ABNORMAL HIGH (ref 65–99)
POTASSIUM: 5.3 mmol/L — AB (ref 3.5–5.1)
Sodium: 140 mmol/L (ref 135–145)

## 2014-11-10 LAB — TROPONIN I
TROPONIN I: 0.03 ng/mL (ref <0.031)
Troponin I: 0.05 ng/mL — ABNORMAL HIGH (ref <0.031)
Troponin I: 0.03 ng/mL (ref <0.031)

## 2014-11-10 MED ORDER — IPRATROPIUM-ALBUTEROL 0.5-2.5 (3) MG/3ML IN SOLN
3.0000 mL | RESPIRATORY_TRACT | Status: DC | PRN
Start: 2014-11-10 — End: 2014-11-17

## 2014-11-10 MED ORDER — METOPROLOL SUCCINATE ER 50 MG PO TB24
150.0000 mg | ORAL_TABLET | Freq: Two times a day (BID) | ORAL | Status: DC
  Administered 2014-11-10 – 2014-11-11 (×3): 150 mg via ORAL
  Filled 2014-11-10 (×6): qty 1

## 2014-11-10 MED ORDER — PREDNISONE 20 MG PO TABS
40.0000 mg | ORAL_TABLET | Freq: Once | ORAL | Status: AC
  Administered 2014-11-10: 40 mg via ORAL
  Filled 2014-11-10: qty 2

## 2014-11-10 NOTE — Progress Notes (Signed)
[facility]  Patient Status: Active (receiving services up to time of hospitalization)  [facility] is providing the following services: RN, PT, OT, MSW and HHA  If patient discharges after hours, please call [phone].   [name] 11/10/2014, 4:46 PM

## 2014-11-10 NOTE — Addendum Note (Signed)
Addended byCorky Sox on: 11/10/2014 02:53 PM   Modules accepted: Level of Service

## 2014-11-10 NOTE — Consult Note (Signed)
CARDIOLOGY CONSULT NOTE   Patient ID: [name] MRN: [id number], DOB/AGE: 1945-08-24   Admit date: 11/09/2014 Date of Consult: 11/10/2014   Primary Physician: Aldine Contes, MD Primary Cardiologist: Dr. Thompson Grayer  Pt. Profile  69 year old gentleman with a history of chronic permanent atrial fibrillation admitted with rapid ventricular response.  Problem List  Past Medical History  Diagnosis Date  . Hypertension   . Chronic systolic heart failure     a. NICM - EF 35% with normal cors 2003. b. Last echo 11/2012 - EF 25-30%.  . Depression   . GERD (gastroesophageal reflux disease)   . Portal vein thrombosis   . Avascular necrosis of bones of both hips     Status post bilateral hip replacements  . Gastritis   . Alcohol abuse   . Erectile dysfunction   . Bipolar disorder   . Hydrocele, unspecified   . Intermittent claudication   . Lung nodule     Chest CT scan on 12/14/2008 showed a nodular opacity at the left lung base felt to most likely represent scarring.  Follow-up chest CT scan on 06/02/2010 showed parenchymal scarring in the left apex, left  lower lobe, and lingula; some of this scarring at the left base had a nodular appearance, unchanged. Chest 09/2012 - stable.  . 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  Thyroid scan 08/28/2009 was normal with no focal areas of abnormal increased or decreased activity seen; the uptake of I 131 sodium iodide at 24 hours was 5.7%.  TSH and free T4 normalized by 08/16/2009.  . Intestinal obstruction   . COPD (chronic obstructive pulmonary disease)   . Clostridium difficile colitis   . E coli bacteremia   . DVT (deep venous thrombosis)     He has hypercoagulability with multiple prior DVTs  . Pleural plaque     H/o asbestos exposure. Chest CT on 06/02/2010 showed stable extensive calcified pleural plaques involving the left hemithorax, consistent with asbestos related pleural disease.    . Weight loss     Normal colonoscopy by Dr. Olevia Perches on 11/06/2010.  . Tachycardia-bradycardia syndrome     a. 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 b. St. Jude gen change 2010.  [name] PAF (paroxysmal atrial fibrillation)     Paroxysmal, failed medical therapy with amiodarone but has done very well with multaq;  d/c 2/2 to recurrent AFib;  Tikosyn load c/b Hyperkalemia during admx 05/2013 => rate control strategy  . 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  . Noncompliance   . Thrombocytopenia   . Osteoarthritis   . Spondylosis   . Anxiety   . CKD (chronic kidney disease)     Renal U/S 12/04/2009 showed no pathological findings. Labs 12/04/2009 include normal ESR, C3, C4; neg ANA; SPEP showed nonspecific increase in the alpha-2 region with no M-spike; UPEP showed no monoclonal free light chains; urine IFE showed polyclonal increase in feree Kappa and/or free Lambda light chains. Baseline Cr reported 1.7-2.5.  . STEMI (ST elevation myocardial infarction) 10/11/2014  . Hyperlipemia   . CHF (congestive heart failure)   . On home oxygen therapy     "3L prn" (11/09/2014)  . Atrial fibrillation with RVR 11/09/2014    Past Surgical History  Procedure Laterality Date  . Total hip arthroplasty Right 03/08/2008     By Dr. Hiram Comber  . Tee without  cardioversion N/A 05/27/2013    Procedure: TRANSESOPHAGEAL ECHOCARDIOGRAM (TEE);  Surgeon: Candee Furbish, MD;  Location: [facility] ENDOSCOPY;  Service: Cardiovascular;  Laterality: N/A;  . Cardioversion N/A 05/27/2013    Procedure: CARDIOVERSION;  Surgeon: Candee Furbish, MD;  Location: [facility] ENDOSCOPY;  Service: Cardiovascular;  Laterality: N/A;  . Cardiac catheterization  2003    Nl cors, EF 35%  . Embolectomy Right 12/10/2013    Procedure: Right Femoral Embolectomy; Fasciotomy Right Lower Leg with Intraoperative arteriogram;  Surgeon: Rosetta Posner, MD;  Location: [place];   Service: Vascular;  Laterality: Right;  . Intraoperative arteriogram Right 12/10/2013    Procedure: INTRA OPERATIVE ARTERIOGRAM;  Surgeon: Rosetta Posner, MD;  Location: [place];  Service: Vascular;  Laterality: Right;  . Fasciotomy closure Right 12/15/2013    Procedure: LEG FASCIOTOMY CLOSURE;  Surgeon: Rosetta Posner, MD;  Location: [facility] OR;  Service: Vascular;  Laterality: Right;  . Embolectomy Left 10/12/2014    Procedure: Left Femoral Thrombectomy with intraoperative arteriogram;  Surgeon: Rosetta Posner, MD;  Location: [facility] OR;  Service: Vascular;  Laterality: Left;  . Hemiarthroplasty hip Left 09/09/2002    bipolar; Dr. Lafayette Dragon  . Joint replacement    . Insert / replace / remove pacemaker  02/2003    Dr. Roe Rutherford, [place] pulse generator identity SR model [id number] serial [id number]; gen change by Greggory Brandy  . Insert / replace / remove pacemaker  06/17/2008    Lead and generator change w/ St. Jude Medical Tendril ST model [id number] (serial  [id number]).          Allergies  Allergies  Allergen Reactions  . Penicillins Rash    HPI   This 69 year old gentleman has chronic atrial fibrillation.  He has a past history of tachybradycardia syndrome and has a pacemaker in place.  He is followed by Dr. Rayann Heman.  He has had multiple admissions recently for exacerbations of atrial fibrillation.He was hospitalized 5/24-6/1 for persistent atrial fibrillation with RVR with subsequent embolization in the setting of nonadherence to oral anticoagulation that resulted in STEMI, TIA, lower extremity ischemia and required embolectomy. He was noted to have EF 30-35% with diffuse hypokinesis on echo during that admission.  He was readmitted on June 6 until 10/27/2014 for atrial fibrillation with rapid ventricular response.  At that time he was switched from metoprolol to heart rate to metoprolol succinate 200 mg daily.  He was continued on low-dose digoxin 0.0625 mg daily.  Because of low blood pressure his ramipril  was discontinued during that admission to allow for use of higher dose beta blocker if necessary.  The patient was discharged at that time to [facility] place.  He was discharged from [place] place earlier this week.  Yesterday he felt weak and dizzy and more short of breath while taking a shower.  He had an appointment to see his PCP in the office and at that time was noted to be markedly tachycardiac and admission was advised.  The patient has a past history of intolerance to amiodarone because of previous thyroid interactions. The patient insists that he has been compliant with his medication at home since discharge from the hospital last time.  [facility] coming in to [facility] up his medicine box.   Inpatient Medications  . apixaban  5 mg Oral BID  . buPROPion  75 mg Oral BID  . digoxin  0.0625 mg Oral Daily  . feeding supplement (ENSURE ENLIVE)  237 mL Oral BID BM  .  metoprolol  200 mg Oral Daily  . pantoprazole  40 mg Oral Daily  . rosuvastatin  20 mg Oral q1800  . tiotropium  18 mcg Inhalation Daily    Family History Family History  Problem Relation Age of Onset  . Hypertension Mother   . Cancer Brother     Unsure of type.  . Asthma Mother   . Coronary artery disease Mother   . Colon cancer Neg Hx   . Lung cancer Neg Hx   . Prostate cancer Neg Hx   . Stroke      mother  . Heart attack      brother and sister ?age      Social History History   Social History  . Marital Status: Widowed    Spouse Name: N/A  . Number of Children: 10  . Years of Education: N/A   Occupational History  . RETIRED    Social History Main Topics  . Smoking status: Former Smoker -- 0.33 packs/day for 54 years    Types: Cigarettes    Quit date: 10/11/2014  . Smokeless tobacco: Never Used  . Alcohol Use: 0.0 oz/week    0 Standard drinks or equivalent per week     Comment: 6/22//2016 "h/o etoh abuse; stopped drinking ~ 2012"  . Drug Use: 1.00 per week    Special: Marijuana      Comment: 11/09/2014 "none since 10/11/2014; did smoke marijuana ~ 1 time/month"  . Sexual Activity: Not Currently   Other Topics Concern  . Not on file   Social History Narrative   Works as a Architect part time   Smoker 1 pack last 1.5 days tobacco, smokes marijuana    Denies EtOH x 4 years (on 10/12/12)   9 kids    From [place]   [place] Veteran    12 grade education               Review of Systems  General:  No chills, fever, night sweats or weight changes.  Cardiovascular:  Positive for dyspnea on exertion. Dermatological: No rash, lesions/masses Respiratory: No cough, dyspnea Urologic: No hematuria, dysuria Abdominal:   No nausea, vomiting, diarrhea, bright red blood per rectum, melena, or hematemesis Neurologic:  No visual changes, wkns, changes in mental status. All other systems reviewed and are otherwise negative except as noted above.  Physical Exam  Blood pressure 122/85, pulse 135, temperature 98.7 F (37.1 C), temperature source Oral, resp. rate 19, height '6\' 3"'  (1.905 m), weight 145 lb 8.1 oz (66 kg), SpO2 100 %.  General: Pleasant, NAD.  Lying flat, no acute distress Psych: Normal affect. Neuro: Alert and oriented X 3. Moves all extremities spontaneously. HEENT: Normal  Neck: Supple without bruits or JVD. Lungs:  Resp regular and unlabored, CTA. Heart: Rapid irregular pulse, no s3, s4, or murmurs. Abdomen: Soft, non-tender, non-distended, BS + x 4.  Extremities: There is no edema.  I can feel a weak left dorsalis pedis pulse.  I cannot feel any pulses in the right foot.  Labs   Recent Labs  11/09/14 1620 11/09/14 2151 11/10/14 0435  TROPONINI 0.04* 0.03 0.05*   Lab Results  Component Value Date   WBC 7.1 10/26/2014   HGB 12.1* 10/26/2014   HCT 37.1* 10/26/2014   MCV 90.9 10/26/2014   PLT 311 10/26/2014    Recent Labs Lab 11/10/14 0435  NA 140  K 5.3*  CL 107  CO2 24  BUN 40*  CREATININE 1.77*  CALCIUM 9.1  GLUCOSE 140*   Lab  Results  Component Value Date   CHOL 92 10/12/2014   HDL 36* 10/12/2014   LDLCALC 50 10/12/2014   TRIG 31 10/12/2014   Lab Results  Component Value Date   DDIMER 2.47* 03/18/2012    Radiology/Studies  X-ray Chest Pa And Lateral  10/24/2014   CLINICAL DATA:  Dyspnea  EXAM: CHEST  2 VIEW  COMPARISON:  None.  FINDINGS: Lungs are hyperinflated. There is left basilar pleural plaque. There is left basilar airspace disease unchanged compared with the prior exams likely reflecting scarring. There is no pneumothorax. There is a nodular opacity in the right lower lung which reflect superimposition of the inferior aspect of the scapula overlying a posterior rib. There is stable cardiomegaly. There is thoracic aortic atherosclerosis. There is a dual lead AICD. There is no acute osseous abnormality.  IMPRESSION: No active cardiopulmonary disease.  Chronic changes at the left lung base.   Electronically Signed   By: Kathreen Devoid   On: 10/24/2014 21:48   Ct Head Wo Contrast  10/11/2014   CLINICAL DATA:  Code stroke. RIGHT-sided facial droop with slurred speech.  EXAM: CT HEAD WITHOUT CONTRAST  TECHNIQUE: Contiguous axial images were obtained from the base of the skull through the vertex without intravenous contrast.  COMPARISON:  CT head 10/12/2012.  FINDINGS: No evidence for acute infarction, hemorrhage, mass lesion, hydrocephalus, or extra-axial fluid. Normal for age cerebral volume. No white matter disease. Calvarium intact. No sinus or mastoid fluid.  IMPRESSION: Negative exam.  No change from priors.  Critical Value/emergent results were called by telephone at the time of interpretation on 10/11/2014 at 1:12 pm to Dr. Janann Colonel , who verbally acknowledged these results.   Electronically Signed   By: Rolla Flatten M.D.   On: 10/11/2014 13:14   Ct Chest W Contrast  10/13/2014   CLINICAL DATA:  69 year old male with unexplained weight loss. Patient with myocardial infarction, CVA, multiple arch O emboli. Acute on  chronic kidney disease.  EXAM: CT CHEST, ABDOMEN, AND PELVIS WITH CONTRAST  TECHNIQUE: Multidetector CT imaging of the chest, abdomen and pelvis was performed following the standard protocol during bolus administration of intravenous contrast.  CONTRAST:  37m OMNIPAQUE IOHEXOL 300 MG/ML  SOLN  COMPARISON:  06/02/2013 and prior chest CTs  FINDINGS: CT CHEST FINDINGS  Mediastinum/Nodes: Cardiomegaly again noted. Moderate coronary artery calcifications are identified. The ascending aorta measures 3.7 cm and greatest diameter. No enlarged lymph nodes are identified. There is no evidence of pericardial effusion.  Lungs/Pleura: Dependent and bibasilar atelectasis/ scarring again noted. Scarring within the mid and lower left lung again identified. Patchy ground-glass opacities within the left upper lobe may represent aspiration or for pneumonia. There is no evidence of suspicious mass or nodule. COPD/emphysema again identified. Left posterior pleural calcifications are again identified.  Musculoskeletal: No acute or suspicious abnormalities.  CT ABDOMEN AND PELVIS FINDINGS  Hepatobiliary: The liver and gallbladder are unremarkable. There is no evidence of biliary dilatation.  Pancreas: Unremarkable  Spleen: Unremarkable  Adrenals/Urinary Tract: Moderate bilateral renal atrophy and cortical thinning noted, right greater than left. There is no evidence of hydronephrosis, renal mass or definite renal calculi.  Stomach/Bowel: There is no evidence of bowel obstruction. No definite bowel wall thickening noted. No dilated bowel loops are present. Hip replacement artifact obscures detail within the pelvis.  Vascular/Lymphatic: No enlarged lymph nodes or abdominal aortic aneurysm identified. Calcifications within the IVC, iliac and femoral veins noted.  Reproductive: Prostate  not well visualized.  Other: No free fluid, abscess or pneumoperitoneum.  Musculoskeletal: Mild stranding in the left inguinal region is likely  postoperative. Bilateral hip replacements identified. No acute bony abnormalities are identified. Degenerative changes in the lumbar spine and bilateral L5 pars defects again noted.  IMPRESSION: No evidence of primary malignancy.  Patchy left upper lobe ground-glass opacities - possible aspiration versus pneumonia.  Cardiomegaly, coronary artery disease, ectatic ascending thoracic aorta, COPD/emphysema and moderate bilateral renal atrophy.   Electronically Signed   By: Margarette Canada M.D.   On: 10/13/2014 14:10   Ct Abdomen Pelvis W Contrast  10/13/2014   CLINICAL DATA:  69 year old male with unexplained weight loss. Patient with myocardial infarction, CVA, multiple arch O emboli. Acute on chronic kidney disease.  EXAM: CT CHEST, ABDOMEN, AND PELVIS WITH CONTRAST  TECHNIQUE: Multidetector CT imaging of the chest, abdomen and pelvis was performed following the standard protocol during bolus administration of intravenous contrast.  CONTRAST:  46m OMNIPAQUE IOHEXOL 300 MG/ML  SOLN  COMPARISON:  06/02/2013 and prior chest CTs  FINDINGS: CT CHEST FINDINGS  Mediastinum/Nodes: Cardiomegaly again noted. Moderate coronary artery calcifications are identified. The ascending aorta measures 3.7 cm and greatest diameter. No enlarged lymph nodes are identified. There is no evidence of pericardial effusion.  Lungs/Pleura: Dependent and bibasilar atelectasis/ scarring again noted. Scarring within the mid and lower left lung again identified. Patchy ground-glass opacities within the left upper lobe may represent aspiration or for pneumonia. There is no evidence of suspicious mass or nodule. COPD/emphysema again identified. Left posterior pleural calcifications are again identified.  Musculoskeletal: No acute or suspicious abnormalities.  CT ABDOMEN AND PELVIS FINDINGS  Hepatobiliary: The liver and gallbladder are unremarkable. There is no evidence of biliary dilatation.  Pancreas: Unremarkable  Spleen: Unremarkable   Adrenals/Urinary Tract: Moderate bilateral renal atrophy and cortical thinning noted, right greater than left. There is no evidence of hydronephrosis, renal mass or definite renal calculi.  Stomach/Bowel: There is no evidence of bowel obstruction. No definite bowel wall thickening noted. No dilated bowel loops are present. Hip replacement artifact obscures detail within the pelvis.  Vascular/Lymphatic: No enlarged lymph nodes or abdominal aortic aneurysm identified. Calcifications within the IVC, iliac and femoral veins noted.  Reproductive: Prostate not well visualized.  Other: No free fluid, abscess or pneumoperitoneum.  Musculoskeletal: Mild stranding in the left inguinal region is likely postoperative. Bilateral hip replacements identified. No acute bony abnormalities are identified. Degenerative changes in the lumbar spine and bilateral L5 pars defects again noted.  IMPRESSION: No evidence of primary malignancy.  Patchy left upper lobe ground-glass opacities - possible aspiration versus pneumonia.  Cardiomegaly, coronary artery disease, ectatic ascending thoracic aorta, COPD/emphysema and moderate bilateral renal atrophy.   Electronically Signed   By: JMargarette CanadaM.D.   On: 10/13/2014 14:10   Dg Chest Port 1 View  11/10/2014   CLINICAL DATA:  Patient with worsening shortness of breath.  EXAM: PORTABLE CHEST - 1 VIEW  COMPARISON:  Chest radiograph 10/24/2014  FINDINGS: Multi lead pacer apparatus overlies the right hemithorax, leads are stable in position. Stable cardiac and mediastinal contours. Right lung is clear. Chronic elevation of the left hemidiaphragm with stable chronic changes involving the left lung.  IMPRESSION: Stable chest radiograph.  No acute cardiopulmonary process.   Electronically Signed   By: DLovey NewcomerM.D.   On: 11/10/2014 01:22   Dg Chest Port 1 View  10/11/2014   CLINICAL DATA:  Shortness of breath.  EXAM: PORTABLE CHEST -  1 VIEW  COMPARISON:  10/11/2014 and 01/17/2014  FINDINGS:  Right-sided pacemaker unchanged. Lungs are adequately inflated with stable chronic elevation of the left hemidiaphragm and stable chronic changes over the left lung. Known calcified pleural plaques. No focal lobar consolidation or effusion cardiomediastinal silhouette and remainder of the exam is unchanged.  IMPRESSION: No active disease.  Chronic stable changes of the left lung. Evidence of asbestos related pleural disease.   Electronically Signed   By: Marin Olp M.D.   On: 10/11/2014 21:56   Dg Chest Portable 1 View  10/11/2014   CLINICAL DATA:  Right facial droop  EXAM: PORTABLE CHEST - 1 VIEW  COMPARISON:  01/17/2014  FINDINGS: Cardiac shadow is again mildly enlarged. A pacing device is again seen. Chronic left pleural effusion and left basilar changes are noted although they have increased in the interval from the prior exam. The right lung remains clear. The visualized upper abdomen is unremarkable.  IMPRESSION: Increasing chronic changes in the left lung base when compare with the prior exam. Calcified pleural plaques are again noted.   Electronically Signed   By: Inez Catalina M.D.   On: 10/11/2014 14:06   Dg Ang/ext/uni/or Left  10/12/2014   CLINICAL DATA:  Femoral thrombectomy for embolic occlusion in the left lower extremity.  EXAM: LEFT ANG/EXT/UNI/ OR  COMPARISON:  None.  FINDINGS: Intraoperative image shows a normally patent popliteal artery and normally patent visualized anterior tibial, peroneal and posterior tibial arteries to nearly the level of the ankle. No filling defects are identified.  IMPRESSION: No distal occlusions or filling defects identified at the level of the popliteal and tibial arteries.   Electronically Signed   By: Aletta Edouard M.D.   On: 10/12/2014 14:02    ECG  09-Nov-2014 15:40:15 [facility] ROUTINE RECORD Atrial fibrillation with rapid ventricular response Left ventricular hypertrophy with repolarization abnormality No significant  change since last tracing 10/24/14 Personally reviewed.  ASSESSMENT AND PLAN  1.  Chronic permanent atrial fibrillation with rapid ventricular response.  On long-term anticoagulation, now with Apixaban.  Prior history of embolic myocardial infarction and emboli to his legs during periods of medication noncompliance. 2.  Left ventricular systolic dysfunction with ejection fraction of 35% on echo 3.  Chronic systolic heart failure, currently euvolemic. 4.  COPD  Plan: Our goal is to continue strategy of rate control and adequate anticoagulation for his chronic atrial fibrillation.  At the present time his heart rate is not adequately controlled.  I will increase his Toprol to 150 mg twice a day.  Continue low-dose digoxin for possible additional additive effect although efficacy is uncertain.  Not a candidate for diltiazem because of his left ventricular systolic dysfunction nor a candidate for amiodarone because of prior hyperthyroidism interaction. Will follow with you  Signed, Darlin Coco MD  11/10/2014, 10:32 AM

## 2014-11-10 NOTE — Progress Notes (Signed)
S: Called by RN that pt is in RDS, more lethargic than usual, and they were unable to get a pulse ox on pt. On arrival pt receiving albuterol nebulizing tx. States he is having SOB. Had an episode of vomiting after breathing treatment.   O: HR on tele ranging from 120's-150's. BP 131/96. O2 sat 100% on 3L. Pt is alert, responds to questions. Lung exam reveals lt basilar wheezing, coarse breath sounds L > rt side. No pedal edema.    A/P: Likely pt experiencing a COPD exacerbation, he also just received his beta blocker this evening. BNP on admission 633, last ECHO in May revealed EF of 30-35% however he does not look volume overloaded. Pt is easily arousable and answering questions. He received his home sleeping aid ramelteon at 2015 today.   - stat ABG ( ordered before pulse ox was able to pick up O2 reading of 100% on 3L) - stat portable CXR - can give pred 40mg  if CXR neg for infxn for COPD exacerbation. Will f/u on results.   Julious Oka, MD Internal Medicine Resident, PGY I [facility] Internal Medicine Program Pager: [phone]

## 2014-11-10 NOTE — Progress Notes (Signed)
MD on call notified about patient elevated HR. Waiting for orders.

## 2014-11-10 NOTE — Progress Notes (Signed)
Initial Nutrition Assessment  DOCUMENTATION CODES:  Underweight, Severe malnutrition in context of chronic illness  INTERVENTION:   Continue Ensure Enlive po BID, each supplement provides 350 kcal and 20 grams of protein  NUTRITION DIAGNOSIS:  Increased nutrient needs related to chronic illness as evidenced by estimated needs  GOAL:  Patient will meet greater than or equal to 90% of their needs  MONITOR:  PO intake, Supplement acceptance, Labs, Weight trends, I & O's  REASON FOR ASSESSMENT:  Malnutrition Screening Tool  ASSESSMENT: 69 year old male with chronic systolic heart failure, chronic atrial fibrillation on oral anticoagulation, HTN, hyperlipidemia, GERD, depression who presented to clinic with dizziness, headache.  RD unable to speak with pt at time of visit.  Pt known to Clinical Nutrition during previous hospital admissions.  + hx of poor PO intake and wt loss.  Malnutrition identified and ongoing.  No % PO intake records currently available.  Pt's wt has been stable since beginning of June 2016.  Enjoys Ensure supplements -- orders in place.  RD unable to complete Nutrition Focused Physical Exam at this time.  Exam completed during previous admission on 10/13/14.  Findings were severe fat depletion, severe muscle depletion, and no edema.  Height:  Ht Readings from Last 1 Encounters:  11/10/14 6\' 3"  (1.905 m)    Weight:  Wt Readings from Last 1 Encounters:  11/10/14 145 lb 8.1 oz (66 kg)    Ideal Body Weight:  89 kg  Wt Readings from Last 10 Encounters:  11/10/14 145 lb 8.1 oz (66 kg)  11/09/14 147 lb 8 oz (66.906 kg)  11/04/14 150 lb 9.6 oz (68.312 kg)  11/01/14 144 lb (65.318 kg)  10/28/14 144 lb (65.318 kg)  10/24/14 147 lb 4.8 oz (66.815 kg)  10/24/14 147 lb 8 oz (66.906 kg)  10/19/14 150 lb 14.4 oz (68.448 kg)  08/10/14 154 lb 1.6 oz (69.899 kg)  07/28/14 153 lb 8 oz (69.627 kg)    BMI:  Body mass index is 18.19 kg/(m^2).  Estimated  Nutritional Needs:  Kcal:  2100-2300  Protein:  105-115 gm  Fluid:  2.1-2.3 L  Skin:  Reviewed, no issues  Diet Order:  Diet regular Room service appropriate?: Yes; Fluid consistency:: Thin  EDUCATION NEEDS:  No education needs identified at this time   Intake/Output Summary (Last 24 hours) at 11/10/14 1430 Last data filed at 11/10/14 1400  Gross per 24 hour  Intake      0 ml  Output    750 ml  Net   -750 ml    Last BM:  6/21  Arthur Holms, RD, LDN Pager #: [phone] After-Hours Pager #: [phone]

## 2014-11-10 NOTE — Consult Note (Signed)
   [facility] Inpatient Consult   11/10/2014  [name] 11-25-1945 [id number] Patient is currently active with [facility] for chronic disease management services.  Patient has been engaged by a [employer] and LCSW.  Our community based plan of care has focused on disease management and community resource support.  Patient will receive a post discharge transition of care call and will be evaluated for monthly home visits for assessments and disease process education. Will make [facility] community aware of admission.  Will make Inpatient Case Manager aware that [facility] following. Of note, [facility] Management services does not replace or interfere with any services that are arranged by inpatient case management or social work.  For additional questions or referrals please contact: Natividad Brood, RN BSN [facility] Liaison  [phone] business mobile phone

## 2014-11-10 NOTE — Progress Notes (Signed)
Subjective: Overnight, he reported worsening shortness of breath and vomited after receiving albuterol treatment. CXR without infiltrate. ABG overnight with pH 7.434/CO2 29.6/O2 112/bicarb 19.5 suggestive of chronic respiratory alkalosis. This AM, he reported to [place] feeling short of breath though denied chest pain.  Objective: Vital signs in last 24 hours: Filed Vitals:   11/09/14 2349 11/10/14 0550 11/10/14 0729 11/10/14 1400  BP:  122/85  113/84  Pulse:  135  140  Temp: 100 F (37.8 C) 98.7 F (37.1 C)  98.7 F (37.1 C)  TempSrc: Axillary Oral  Oral  Resp:  19  21  Height:  6\' 3"  (1.905 m)    Weight:  145 lb 8.1 oz (66 kg)    SpO2:  100% 100% 100%   Weight change:   Intake/Output Summary (Last 24 hours) at 11/10/14 1632 Last data filed at 11/10/14 1539  Gross per 24 hour  Intake    480 ml  Output    750 ml  Net   -270 ml   General: thin elderly African American male, resting in bed, NAD, 3L O2 by [place] HEENT: PERRL, EOMI, no scleral icterus, oropharynx clear Cardiac: irregular rate, no rubs, murmurs or gallops Pulm: bilateral inspiratory wheezing with some crackles at the R base Abd: soft, nontender, nondistended, BS present Ext: warm and well perfused, no pedal edema Neuro: responds to questions appropriately; moving all extremities freely   Lab Results: Basic Metabolic Panel:  Recent Labs Lab 11/09/14 1620 11/10/14 0435  NA 139 140  K 5.5* 5.3*  CL 103 107  CO2 26 24  GLUCOSE 97 140*  BUN 35* 40*  CREATININE 1.81* 1.77*  CALCIUM 9.5 9.1   Cardiac Enzymes:  Recent Labs Lab 11/09/14 2151 11/10/14 0435 11/10/14 1056  TROPONINI 0.03 0.05* 0.03   Coagulation:  Recent Labs Lab 11/09/14 1628  INR 1.9   Studies/Results: Dg Chest Port 1 View  11/10/2014   CLINICAL DATA:  Patient with worsening shortness of breath.  EXAM: PORTABLE CHEST - 1 VIEW  COMPARISON:  Chest radiograph 10/24/2014  FINDINGS: Multi lead pacer apparatus overlies the right  hemithorax, leads are stable in position. Stable cardiac and mediastinal contours. Right lung is clear. Chronic elevation of the left hemidiaphragm with stable chronic changes involving the left lung.  IMPRESSION: Stable chest radiograph.  No acute cardiopulmonary process.   Electronically Signed   By: Lovey Newcomer M.D.   On: 11/10/2014 01:22   Medications: I have reviewed the patient's current medications. Scheduled Meds: . apixaban  5 mg Oral BID  . buPROPion  75 mg Oral BID  . digoxin  0.0625 mg Oral Daily  . feeding supplement (ENSURE ENLIVE)  237 mL Oral BID BM  . metoprolol  150 mg Oral BID PC  . pantoprazole  40 mg Oral Daily  . rosuvastatin  20 mg Oral q1800  . tiotropium  18 mcg Inhalation Daily   Continuous Infusions:  PRN Meds:.albuterol, oxyCODONE-acetaminophen **AND** oxyCODONE, ramelteon, senna-docusate Assessment/Plan:  COPD: Per review of his meds, it appears he has not had but one dose of his albuterol today and another dose overnight. Home medications include albuterol 2 puffs as needed every 6 hours, Spiriva 90mcg daily. ABG suggestive of chronic respiratory alkalosis. -Encourage him to ask for albuterol or consider scheduling it  Chronic atrial fibrillation with RVR: Possibly in the setting of worsening of his respiratory disease as he did report dyspnea on the day of his admission .CHADSVASC score 4 which is consistent with a  4.8 % stroke/TIA/embolism. HAS-BLED score 4 which is consistent with 8.7 bleeds per 100 patient-years. Heart rate remains 120s to 130s. Home medications include Coumadin 7.5 mg for INR goal 2-3, Toprol-XL 200 mg, digoxin 0.0625 mg. Cardiology was consulted today for additional recommendations. -Increase Toprol-XL 200mg ->150mg  twice daily -Continue digoxin -Continue Eliquis per pharmacy -Monitor on telemetry  Chronic systolic CHF: Echo in May 2016 notable for EF 30-35% with diffuse hypokinesis. S/p pacemaker placement. -Toprol and digoxin as noted  above  GERD: Home medications include Prilosec 40 mg daily. -Continue medications  Hyperlipidemia: Home medications include rosuvastatin 20 mg. -Continue home medications  Depression: Her medications include Wellbutrin 75 mg twice daily. -Continue home medications  HTN: BP trending mostly 120s-130s/80s-90s. -Toprol as noted above  Chronic pain: He reports taking Percocet 10/325 mg 4-5 times a day for the pain following his hip procedure. -Continue home medications  Severe malnutrition: At his most recent hospitalization, he was noted at BMI 19.2 with reported 24 pound weight loss over the last 5 months. -Give Ensure twice daily between meals  #FEN:  -Diet: Regular  #DVT prophylaxis: Eliquis per pharmacy  #CODE STATUS: FULL CODE -Defer to [place] and daughter Davy Pique [phone] if patients lacks decision-making capacity -Confirmed with patient on admission    Dispo: Disposition is deferred at this time, awaiting improvement of current medical problems.    The patient does have a current PCP (Aldine Contes, MD) and does need an [facility] hospital follow-up appointment after discharge.  The patient does not know have transportation limitations that hinder transportation to clinic appointments.  .Services Needed at time of discharge: Y = Yes, Blank = No PT:   OT:   RN:   Equipment:   Other:     LOS: 1 day   Riccardo Dubin, MD 11/10/2014, 4:32 PM

## 2014-11-10 NOTE — Progress Notes (Signed)
Contacted physician and made aware of ECG rhythm and heart rate range 120's-140's. Patient refused to allow lab to draw troponin levels. Physician spoke with patient on phone. No further orders given.

## 2014-11-10 NOTE — Progress Notes (Signed)
Internal Medicine Clinic Attending  I saw and evaluated the patient.  I personally confirmed the key portions of the history and exam documented by Dr. Jones and I reviewed pertinent patient test results.  The assessment, diagnosis, and plan were formulated together and I agree with the documentation in the resident's note.     

## 2014-11-11 ENCOUNTER — Encounter (HOSPITAL_COMMUNITY): Payer: Self-pay | Admitting: Nurse Practitioner

## 2014-11-11 DIAGNOSIS — N184 Chronic kidney disease, stage 4 (severe): Secondary | ICD-10-CM

## 2014-11-11 DIAGNOSIS — I481 Persistent atrial fibrillation: Principal | ICD-10-CM

## 2014-11-11 DIAGNOSIS — R778 Other specified abnormalities of plasma proteins: Secondary | ICD-10-CM | POA: Diagnosis present

## 2014-11-11 DIAGNOSIS — R509 Fever, unspecified: Secondary | ICD-10-CM

## 2014-11-11 DIAGNOSIS — R7989 Other specified abnormal findings of blood chemistry: Secondary | ICD-10-CM | POA: Diagnosis not present

## 2014-11-11 DIAGNOSIS — I10 Essential (primary) hypertension: Secondary | ICD-10-CM

## 2014-11-11 DIAGNOSIS — A419 Sepsis, unspecified organism: Secondary | ICD-10-CM | POA: Diagnosis not present

## 2014-11-11 DIAGNOSIS — N390 Urinary tract infection, site not specified: Secondary | ICD-10-CM | POA: Diagnosis not present

## 2014-11-11 DIAGNOSIS — D72829 Elevated white blood cell count, unspecified: Secondary | ICD-10-CM | POA: Insufficient documentation

## 2014-11-11 DIAGNOSIS — K5900 Constipation, unspecified: Secondary | ICD-10-CM

## 2014-11-11 DIAGNOSIS — I5042 Chronic combined systolic (congestive) and diastolic (congestive) heart failure: Secondary | ICD-10-CM

## 2014-11-11 DIAGNOSIS — I4891 Unspecified atrial fibrillation: Secondary | ICD-10-CM | POA: Diagnosis not present

## 2014-11-11 LAB — URINALYSIS, ROUTINE W REFLEX MICROSCOPIC
BILIRUBIN URINE: NEGATIVE
Glucose, UA: NEGATIVE mg/dL
Ketones, ur: NEGATIVE mg/dL
Nitrite: NEGATIVE
PROTEIN: 100 mg/dL — AB
SPECIFIC GRAVITY, URINE: 1.015 (ref 1.005–1.030)
UROBILINOGEN UA: 1 mg/dL (ref 0.0–1.0)
pH: 7.5 (ref 5.0–8.0)

## 2014-11-11 LAB — TROPONIN I
Troponin I: 0.04 ng/mL — ABNORMAL HIGH (ref <0.031)
Troponin I: 0.04 ng/mL — ABNORMAL HIGH (ref <0.031)
Troponin I: 0.06 ng/mL — ABNORMAL HIGH (ref <0.031)

## 2014-11-11 LAB — CBC
HCT: 38.7 % — ABNORMAL LOW (ref 39.0–52.0)
Hemoglobin: 12.8 g/dL — ABNORMAL LOW (ref 13.0–17.0)
MCH: 30.4 pg (ref 26.0–34.0)
MCHC: 33.1 g/dL (ref 30.0–36.0)
MCV: 91.9 fL (ref 78.0–100.0)
PLATELETS: 118 10*3/uL — AB (ref 150–400)
RBC: 4.21 MIL/uL — ABNORMAL LOW (ref 4.22–5.81)
RDW: 15.1 % (ref 11.5–15.5)
WBC: 20 10*3/uL — AB (ref 4.0–10.5)

## 2014-11-11 LAB — BASIC METABOLIC PANEL
Anion gap: 7 (ref 5–15)
BUN: 42 mg/dL — ABNORMAL HIGH (ref 6–20)
CO2: 26 mmol/L (ref 22–32)
CREATININE: 1.87 mg/dL — AB (ref 0.61–1.24)
Calcium: 8.8 mg/dL — ABNORMAL LOW (ref 8.9–10.3)
Chloride: 104 mmol/L (ref 101–111)
GFR calc Af Amer: 40 mL/min — ABNORMAL LOW (ref >60)
GFR calc non Af Amer: 35 mL/min — ABNORMAL LOW (ref >60)
Glucose, Bld: 109 mg/dL — ABNORMAL HIGH (ref 65–99)
Potassium: 4.6 mmol/L (ref 3.5–5.1)
Sodium: 137 mmol/L (ref 135–145)

## 2014-11-11 LAB — URINE MICROSCOPIC-ADD ON

## 2014-11-11 MED ORDER — OXYCODONE HCL 5 MG PO TABS
10.0000 mg | ORAL_TABLET | ORAL | Status: DC | PRN
  Administered 2014-11-11 – 2014-11-16 (×13): 10 mg via ORAL
  Filled 2014-11-11 (×13): qty 2

## 2014-11-11 MED ORDER — FLUCONAZOLE 100 MG PO TABS
100.0000 mg | ORAL_TABLET | Freq: Every day | ORAL | Status: DC
  Administered 2014-11-11 – 2014-11-12 (×2): 100 mg via ORAL
  Filled 2014-11-11 (×2): qty 1

## 2014-11-11 MED ORDER — MAGNESIUM CITRATE PO SOLN
1.0000 | Freq: Once | ORAL | Status: AC
  Administered 2014-11-11: 1 via ORAL
  Filled 2014-11-11: qty 296

## 2014-11-11 MED ORDER — VANCOMYCIN HCL IN DEXTROSE 1-5 GM/200ML-% IV SOLN
1000.0000 mg | Freq: Once | INTRAVENOUS | Status: DC
  Filled 2014-11-11: qty 200

## 2014-11-11 MED ORDER — LEVOFLOXACIN 250 MG PO TABS
250.0000 mg | ORAL_TABLET | Freq: Every day | ORAL | Status: DC
  Administered 2014-11-11: 250 mg via ORAL
  Filled 2014-11-11 (×2): qty 1

## 2014-11-11 MED ORDER — VANCOMYCIN HCL IN DEXTROSE 1-5 GM/200ML-% IV SOLN: 1000.0000 mg | INTRAVENOUS | Status: DC

## 2014-11-11 MED ORDER — LEVOFLOXACIN IN D5W 750 MG/150ML IV SOLN: 750.0000 mg | INTRAVENOUS | Status: DC

## 2014-11-11 MED ORDER — DILTIAZEM HCL ER COATED BEADS 180 MG PO CP24
180.0000 mg | ORAL_CAPSULE | Freq: Every day | ORAL | Status: DC
  Administered 2014-11-11: 180 mg via ORAL
  Filled 2014-11-11 (×2): qty 1

## 2014-11-11 MED ORDER — LEVOFLOXACIN IN D5W 750 MG/150ML IV SOLN
750.0000 mg | Freq: Once | INTRAVENOUS | Status: DC
  Filled 2014-11-11: qty 150

## 2014-11-11 MED ORDER — DEXTROSE 5 % IV SOLN
1.0000 g | Freq: Three times a day (TID) | INTRAVENOUS | Status: DC
  Filled 2014-11-11 (×2): qty 1

## 2014-11-11 MED ORDER — DEXTROSE 5 % IV SOLN
2.0000 g | Freq: Once | INTRAVENOUS | Status: DC
  Filled 2014-11-11: qty 2

## 2014-11-11 NOTE — Progress Notes (Signed)
PT Cancellation Note  Patient Details Name: [name] MRN: [id number] DOB: 1946-03-26   Cancelled Treatment:    Reason Eval/Treat Not Completed: Patient declined, no reason specified. Therapist provided max encouragement to participate in evaluation, however pt became agitated and raised voice - states he feels too bad to do anything. Offered to get pt set-up for breakfast and pt agreed. Once tray was moved towards him pt again raised voice and said he was not ready to eat yet. Will check back as schedule allows to complete PT eval.    Rolinda Roan 11/11/2014, 8:25 AM   Rolinda Roan, PT, DPT Acute Rehabilitation Services Pager: [phone]

## 2014-11-11 NOTE — Progress Notes (Signed)
Subjective: This AM, he reports not feeling well and feels he is constipated. He reports not knowing when he last had a bowel movement and is asking for something else to try. He is also irritable when asked to turn off the TV and uncover his legs   Objective: Vital signs in last 24 hours: Filed Vitals:   11/10/14 1949 11/10/14 2152 11/11/14 0455 11/11/14 0527  BP: 123/78 106/78 96/67   Pulse: 158 130 125   Temp: 100.9 F (38.3 C) 99.5 F (37.5 C) 97.8 F (36.6 C)   TempSrc: Oral Oral Oral   Resp: 20 18 18    Height:      Weight:    145 lb 11.2 oz (66.089 kg)  SpO2: 100% 100% 100%    Weight change: 3.1 oz (0.089 kg)  Intake/Output Summary (Last 24 hours) at 11/11/14 0825 Last data filed at 11/10/14 2210  Gross per 24 hour  Intake    480 ml  Output    760 ml  Net   -280 ml   General: thin elderly African American male, irritable, resting in bed, NAD HEENT: PERRL, EOMI, no scleral icterus, oropharynx clear Cardiac: irregular rate, no rubs, murmurs or gallops Pulm: wheezing noted bilaterally, prominently at the bases Abd: soft, nontender, nondistended, BS present Ext: warm and well perfused, no pedal edema Neuro: responds to questions appropriately; moving all extremities freely   Lab Results: Basic Metabolic Panel:  Recent Labs Lab 11/10/14 0435 11/11/14 0438  NA 140 137  K 5.3* 4.6  CL 107 104  CO2 24 26  GLUCOSE 140* 109*  BUN 40* 42*  CREATININE 1.77* 1.87*  CALCIUM 9.1 8.8*   Cardiac Enzymes:  Recent Labs Lab 11/10/14 1056 11/10/14 1635 11/11/14 0438  TROPONINI 0.03 0.03 0.04*   Coagulation:  Recent Labs Lab 11/09/14 1628  INR 1.9   Studies/Results: Dg Chest Port 1 View  11/10/2014   CLINICAL DATA:  Patient with worsening shortness of breath.  EXAM: PORTABLE CHEST - 1 VIEW  COMPARISON:  Chest radiograph 10/24/2014  FINDINGS: Multi lead pacer apparatus overlies the right hemithorax, leads are stable in position. Stable cardiac and mediastinal  contours. Right lung is clear. Chronic elevation of the left hemidiaphragm with stable chronic changes involving the left lung.  IMPRESSION: Stable chest radiograph.  No acute cardiopulmonary process.   Electronically Signed   By: Lovey Newcomer M.D.   On: 11/10/2014 01:22   Medications: I have reviewed the patient's current medications. Scheduled Meds: . apixaban  5 mg Oral BID  . buPROPion  75 mg Oral BID  . digoxin  0.0625 mg Oral Daily  . feeding supplement (ENSURE ENLIVE)  237 mL Oral BID BM  . magnesium citrate  1 Bottle Oral Once  . metoprolol  150 mg Oral BID PC  . pantoprazole  40 mg Oral Daily  . rosuvastatin  20 mg Oral q1800  . tiotropium  18 mcg Inhalation Daily   Continuous Infusions:  PRN Meds:.ipratropium-albuterol, oxyCODONE-acetaminophen **AND** oxyCODONE, ramelteon, senna-docusate Assessment/Plan:  Fever, leukocytosis: Exudates noted on physical exam this AM concerning for oral candidiasis. Fever of 100.9 overnight, WBC 20 this AM. Still normotensive. -Started Diflucan 100mg  today [place] 1/7] -Check urine, blood, throat cultures  Constipation: Likely from his opioids. He took Senokot-S once yesterday. -Give Mg citrate and consider enema if it does not improve his symptoms  COPD: He is using his home albuterol in the room instead of asking for it which is why it appeared as though  he hadn't been receiving treatment.  -Encourage him to ask for nebulizer treatments  Chronic atrial fibrillation with RVR: CHADSVASC score 4 which is consistent with a 4.8 % stroke/TIA/embolism. HAS-BLED score 4 which is consistent with 8.7 bleeds per 100 patient-years. Heart rate since yesterday mostly 130-160. Home medications include Coumadin 7.5 mg for INR goal 2-3, Toprol-XL 200 mg, digoxin 0.0625 mg.  -Continue Toprol-XL 150mg  twice daily -Continue digoxin -Continue Eliquis per pharmacy -Monitor on telemetry -Cardiology following, appreciate recommendations  Chronic systolic CHF: Echo  in May 2016 notable for EF 30-35% with diffuse hypokinesis. S/p pacemaker placement. -Toprol and digoxin as noted above  GERD: Home medications include Prilosec 40 mg daily. -Continue medications  Hyperlipidemia: Home medications include rosuvastatin 20 mg. -Continue home medications  Depression: Her medications include Wellbutrin 75 mg twice daily. -Continue home medications  HTN: BP trending mostly 90s-100s/70s-80s. -Toprol as noted above  Chronic pain: He reports taking Percocet 10/325 mg 4-5 times a day for the pain following his hip procedure. -Continue home medications  Severe malnutrition: At his most recent hospitalization, he was noted at BMI 19.2 with reported 24 pound weight loss over the last 5 months. -Give Ensure twice daily between meals  #FEN:  -Diet: Regular  #DVT prophylaxis: Eliquis per pharmacy  #CODE STATUS: FULL CODE -Defer to [place] and daughter Davy Pique [phone] if patients lacks decision-making capacity -Confirmed with patient on admission    Dispo: Disposition is deferred at this time, awaiting improvement of current medical problems.    The patient does have a current PCP (Aldine Contes, MD) and does need an [facility] hospital follow-up appointment after discharge.  The patient does not know have transportation limitations that hinder transportation to clinic appointments.  .Services Needed at time of discharge: Y = Yes, Blank = No PT:   OT:   RN:   Equipment:   Other:     LOS: 2 days   Riccardo Dubin, MD 11/11/2014, 8:25 AM

## 2014-11-11 NOTE — Progress Notes (Addendum)
ANTIBIOTIC CONSULT NOTE - INITIAL  Pharmacy Consult for levaquin, aztreonam, vancomycin  Indication: rule out sepsis  Allergies  Allergen Reactions  . Penicillins Rash    Patient Measurements: Height: '6\' 3"'  (190.5 cm) Weight: 145 lb 11.2 oz (66.089 kg) IBW/kg (Calculated) : 84.5 Adjusted Body Weight:   Vital Signs: Temp: 97.8 F (36.6 C) (06/24 0455) Temp Source: Oral (06/24 0455) BP: 96/67 mmHg (06/24 0455) Pulse Rate: 125 (06/24 0455) Intake/Output from previous day: 06/23 0701 - 06/24 0700 In: 480 [P.O.:480] Out: 860 [Urine:860] Intake/Output from this shift: Total I/O In: -  Out: 400 [Urine:400]  Labs:  Recent Labs  11/09/14 1620 11/10/14 0435 11/11/14 0438  WBC  --   --  20.0*  HGB  --   --  12.8*  PLT  --   --  118*  CREATININE 1.81* 1.77* 1.87*   Estimated Creatinine Clearance: 34.4 mL/min (by C-G formula based on Cr of 1.87). No results for input(s): VANCOTROUGH, VANCOPEAK, VANCORANDOM, GENTTROUGH, GENTPEAK, GENTRANDOM, TOBRATROUGH, TOBRAPEAK, TOBRARND, AMIKACINPEAK, AMIKACINTROU, AMIKACIN in the last 72 hours.   Microbiology: No results found for this or any previous visit (from the past 720 hour(s)).  Medical History: Past Medical History  Diagnosis Date  . Hypertension   . Chronic systolic heart failure     a. NICM - EF 35% with normal cors 2003. b. Last echo 11/2012 - EF 25-30%.  . Depression   . GERD (gastroesophageal reflux disease)   . Portal vein thrombosis   . Avascular necrosis of bones of both hips     Status post bilateral hip replacements  . Gastritis   . Alcohol abuse   . Erectile dysfunction   . Bipolar disorder   . Hydrocele, unspecified   . Intermittent claudication   . Lung nodule     Chest CT scan on 12/14/2008 showed a nodular opacity at the left lung base felt to most likely represent scarring.  Follow-up chest CT scan on 06/02/2010 showed parenchymal scarring in the left apex, left  lower lobe, and lingula; some of this  scarring at the left base had a nodular appearance, unchanged. Chest 09/2012 - stable.  . 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  Thyroid scan 08/28/2009 was normal with no focal areas of abnormal increased or decreased activity seen; the uptake of I 131 sodium iodide at 24 hours was 5.7%.  TSH and free T4 normalized by 08/16/2009.  . Intestinal obstruction   . COPD (chronic obstructive pulmonary disease)   . Clostridium difficile colitis   . E coli bacteremia   . DVT (deep venous thrombosis)     He has hypercoagulability with multiple prior DVTs  . Pleural plaque     H/o asbestos exposure. Chest CT on 06/02/2010 showed stable extensive calcified pleural plaques involving the left hemithorax, consistent with asbestos related pleural disease.  . Weight loss     Normal colonoscopy by Dr. Olevia Perches on 11/06/2010.  . Tachycardia-bradycardia syndrome     a. 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 b. [employer] gen change 2010.  [name] PAF (paroxysmal atrial fibrillation)     Paroxysmal, failed medical therapy with amiodarone but has done very well with multaq;  d/c 2/2 to recurrent AFib;  Tikosyn load c/b Hyperkalemia during admx 05/2013 => rate control strategy  . 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  . Noncompliance   .  Thrombocytopenia   . Osteoarthritis   . Spondylosis   . Anxiety   . CKD (chronic kidney disease)     Renal U/S 12/04/2009 showed no pathological findings. Labs 12/04/2009 include normal ESR, C3, C4; neg ANA; SPEP showed nonspecific increase in the alpha-2 region with no M-spike; UPEP showed no monoclonal free light chains; urine IFE showed polyclonal increase in feree Kappa and/or free Lambda light chains. Baseline Cr reported 1.7-2.5.  . STEMI (ST elevation myocardial infarction) 10/11/2014  . Hyperlipemia   . CHF (congestive heart failure)   .  On home oxygen therapy     "3L prn" (11/09/2014)  . Atrial fibrillation with RVR 11/09/2014    Medications:  Anti-infectives    Start     Dose/Rate Route Frequency Ordered Stop   11/13/14 1400  levofloxacin (LEVAQUIN) IVPB 750 mg     750 mg 100 mL/hr over 90 Minutes Intravenous Every 48 hours 11/11/14 1314     11/12/14 1400  vancomycin (VANCOCIN) IVPB 1000 mg/200 mL premix     1,000 mg 200 mL/hr over 60 Minutes Intravenous Every 24 hours 11/11/14 1314     11/11/14 2200  aztreonam (AZACTAM) 1 g in dextrose 5 % 50 mL IVPB     1 g 100 mL/hr over 30 Minutes Intravenous 3 times per day 11/11/14 1314     11/11/14 1330  levofloxacin (LEVAQUIN) IVPB 750 mg     750 mg 100 mL/hr over 90 Minutes Intravenous  Once 11/11/14 1253     11/11/14 1330  aztreonam (AZACTAM) 2 g in dextrose 5 % 50 mL IVPB     2 g 100 mL/hr over 30 Minutes Intravenous  Once 11/11/14 1253     11/11/14 1330  vancomycin (VANCOCIN) IVPB 1000 mg/200 mL premix     1,000 mg 200 mL/hr over 60 Minutes Intravenous  Once 11/11/14 1253     11/11/14 1200  fluconazole (DIFLUCAN) tablet 100 mg     100 mg Oral Daily 11/11/14 1053 11/18/14 0959     Assessment: 69 yom initially presented with dizziness and headache to now start broad-spectrum antibiotics for possible sepsis. Tmax 100.9 and WBC is elevated at 20. Scr elevated at 1.87. First doses ordered by MD. Cultures are pending.    Vanc 6/24>> Aztreo 6/24>> Levaquin 6/24>> Flucon 6/24>>  Goal of Therapy:  Vancomycin trough level 15-20 mcg/ml  Plan:  - Levaquin 753m IV Q48H - Aztreonam 1gm IV Q8H - Vanc 1gm IV Q24H - F/u renal fxn, C&S, clinical status and trough at [place] RRande Lawman6/24/2016,1:17 PM  Addendum  De-escalating to levaquin monotherapy to cover for UTI. He does have some renal impairment so will use lower dose. Past cultures have shown sens e.coli.   Plan  Levaquin 2593mPO qday  MiOnnie BoerPharmD Pager: [phone]/24/2016 5:05  PM

## 2014-11-11 NOTE — Progress Notes (Signed)
SUBJECTIVE:  No complaints  OBJECTIVE:   Vitals:   Filed Vitals:   11/10/14 2152 11/11/14 0455 11/11/14 0527 11/11/14 0900  BP: 106/78 96/67    Pulse: 130 125    Temp: 99.5 F (37.5 C) 97.8 F (36.6 C)    TempSrc: Oral Oral    Resp: 18 18    Height:      Weight:   145 lb 11.2 oz (66.089 kg)   SpO2: 100% 100%  100%   I&O's:   Intake/Output Summary (Last 24 hours) at 11/11/14 0955 Last data filed at 11/11/14 0846  Gross per 24 hour  Intake    240 ml  Output    860 ml  Net   -620 ml   TELEMETRY: Reviewed telemetry pt in atrial fibrillation with RVR     PHYSICAL EXAM General: Well developed, well nourished, in no acute distress Head: Eyes PERRLA, No xanthomas.   Normal cephalic and atramatic  Lungs:   Clear bilaterally to auscultation and percussion. Heart:   irregularly irregular and tachy S1 S2 Pulses are 2+ & equal. Abdomen: Bowel sounds are positive, abdomen soft and non-tender without masses Extremities:   No clubbing, cyanosis or edema.  DP +1 Neuro: Alert and oriented X 3. Psych:  Good affect, responds appropriately   LABS: Basic Metabolic Panel:  Recent Labs  11/10/14 0435 11/11/14 0438  NA 140 137  K 5.3* 4.6  CL 107 104  CO2 24 26  GLUCOSE 140* 109*  BUN 40* 42*  CREATININE 1.77* 1.87*  CALCIUM 9.1 8.8*   Liver Function Tests: No results for input(s): AST, ALT, ALKPHOS, BILITOT, PROT, ALBUMIN in the last 72 hours. No results for input(s): LIPASE, AMYLASE in the last 72 hours. CBC:  Recent Labs  11/11/14 0438  WBC 20.0*  HGB 12.8*  HCT 38.7*  MCV 91.9  PLT 118*   Cardiac Enzymes:  Recent Labs  11/10/14 1056 11/10/14 1635 11/11/14 0438  TROPONINI 0.03 0.03 0.04*   BNP: Invalid input(s): POCBNP D-Dimer: No results for input(s): DDIMER in the last 72 hours. Hemoglobin A1C: No results for input(s): HGBA1C in the last 72 hours. Fasting Lipid Panel: No results for input(s): CHOL, HDL, LDLCALC, TRIG, CHOLHDL, LDLDIRECT in the  last 72 hours. Thyroid Function Tests: No results for input(s): TSH, T4TOTAL, T3FREE, THYROIDAB in the last 72 hours.  Invalid input(s): FREET3 Anemia Panel: No results for input(s): VITAMINB12, FOLATE, FERRITIN, TIBC, IRON, RETICCTPCT in the last 72 hours. Coag Panel:   Lab Results  Component Value Date   INR 1.9 11/09/2014   INR 2.20* 10/27/2014   INR 2.06* 10/26/2014    RADIOLOGY: X-ray Chest Pa And Lateral  10/24/2014   CLINICAL DATA:  Dyspnea  EXAM: CHEST  2 VIEW  COMPARISON:  None.  FINDINGS: Lungs are hyperinflated. There is left basilar pleural plaque. There is left basilar airspace disease unchanged compared with the prior exams likely reflecting scarring. There is no pneumothorax. There is a nodular opacity in the right lower lung which reflect superimposition of the inferior aspect of the scapula overlying a posterior rib. There is stable cardiomegaly. There is thoracic aortic atherosclerosis. There is a dual lead AICD. There is no acute osseous abnormality.  IMPRESSION: No active cardiopulmonary disease.  Chronic changes at the left lung base.   Electronically Signed   By: Kathreen Devoid   On: 10/24/2014 21:48   Ct Chest W Contrast  10/13/2014   CLINICAL DATA:  69 year old male with unexplained weight loss.  Patient with myocardial infarction, CVA, multiple arch O emboli. Acute on chronic kidney disease.  EXAM: CT CHEST, ABDOMEN, AND PELVIS WITH CONTRAST  TECHNIQUE: Multidetector CT imaging of the chest, abdomen and pelvis was performed following the standard protocol during bolus administration of intravenous contrast.  CONTRAST:  55mL OMNIPAQUE IOHEXOL 300 MG/ML  SOLN  COMPARISON:  06/02/2013 and prior chest CTs  FINDINGS: CT CHEST FINDINGS  Mediastinum/Nodes: Cardiomegaly again noted. Moderate coronary artery calcifications are identified. The ascending aorta measures 3.7 cm and greatest diameter. No enlarged lymph nodes are identified. There is no evidence of pericardial effusion.   Lungs/Pleura: Dependent and bibasilar atelectasis/ scarring again noted. Scarring within the mid and lower left lung again identified. Patchy ground-glass opacities within the left upper lobe may represent aspiration or for pneumonia. There is no evidence of suspicious mass or nodule. COPD/emphysema again identified. Left posterior pleural calcifications are again identified.  Musculoskeletal: No acute or suspicious abnormalities.  CT ABDOMEN AND PELVIS FINDINGS  Hepatobiliary: The liver and gallbladder are unremarkable. There is no evidence of biliary dilatation.  Pancreas: Unremarkable  Spleen: Unremarkable  Adrenals/Urinary Tract: Moderate bilateral renal atrophy and cortical thinning noted, right greater than left. There is no evidence of hydronephrosis, renal mass or definite renal calculi.  Stomach/Bowel: There is no evidence of bowel obstruction. No definite bowel wall thickening noted. No dilated bowel loops are present. Hip replacement artifact obscures detail within the pelvis.  Vascular/Lymphatic: No enlarged lymph nodes or abdominal aortic aneurysm identified. Calcifications within the IVC, iliac and femoral veins noted.  Reproductive: Prostate not well visualized.  Other: No free fluid, abscess or pneumoperitoneum.  Musculoskeletal: Mild stranding in the left inguinal region is likely postoperative. Bilateral hip replacements identified. No acute bony abnormalities are identified. Degenerative changes in the lumbar spine and bilateral L5 pars defects again noted.  IMPRESSION: No evidence of primary malignancy.  Patchy left upper lobe ground-glass opacities - possible aspiration versus pneumonia.  Cardiomegaly, coronary artery disease, ectatic ascending thoracic aorta, COPD/emphysema and moderate bilateral renal atrophy.   Electronically Signed   By: Margarette Canada M.D.   On: 10/13/2014 14:10   Ct Abdomen Pelvis W Contrast  10/13/2014   CLINICAL DATA:  69 year old male with unexplained weight loss.  Patient with myocardial infarction, CVA, multiple arch O emboli. Acute on chronic kidney disease.  EXAM: CT CHEST, ABDOMEN, AND PELVIS WITH CONTRAST  TECHNIQUE: Multidetector CT imaging of the chest, abdomen and pelvis was performed following the standard protocol during bolus administration of intravenous contrast.  CONTRAST:  64mL OMNIPAQUE IOHEXOL 300 MG/ML  SOLN  COMPARISON:  06/02/2013 and prior chest CTs  FINDINGS: CT CHEST FINDINGS  Mediastinum/Nodes: Cardiomegaly again noted. Moderate coronary artery calcifications are identified. The ascending aorta measures 3.7 cm and greatest diameter. No enlarged lymph nodes are identified. There is no evidence of pericardial effusion.  Lungs/Pleura: Dependent and bibasilar atelectasis/ scarring again noted. Scarring within the mid and lower left lung again identified. Patchy ground-glass opacities within the left upper lobe may represent aspiration or for pneumonia. There is no evidence of suspicious mass or nodule. COPD/emphysema again identified. Left posterior pleural calcifications are again identified.  Musculoskeletal: No acute or suspicious abnormalities.  CT ABDOMEN AND PELVIS FINDINGS  Hepatobiliary: The liver and gallbladder are unremarkable. There is no evidence of biliary dilatation.  Pancreas: Unremarkable  Spleen: Unremarkable  Adrenals/Urinary Tract: Moderate bilateral renal atrophy and cortical thinning noted, right greater than left. There is no evidence of hydronephrosis, renal mass or definite renal calculi.  Stomach/Bowel: There is no evidence of bowel obstruction. No definite bowel wall thickening noted. No dilated bowel loops are present. Hip replacement artifact obscures detail within the pelvis.  Vascular/Lymphatic: No enlarged lymph nodes or abdominal aortic aneurysm identified. Calcifications within the IVC, iliac and femoral veins noted.  Reproductive: Prostate not well visualized.  Other: No free fluid, abscess or pneumoperitoneum.   Musculoskeletal: Mild stranding in the left inguinal region is likely postoperative. Bilateral hip replacements identified. No acute bony abnormalities are identified. Degenerative changes in the lumbar spine and bilateral L5 pars defects again noted.  IMPRESSION: No evidence of primary malignancy.  Patchy left upper lobe ground-glass opacities - possible aspiration versus pneumonia.  Cardiomegaly, coronary artery disease, ectatic ascending thoracic aorta, COPD/emphysema and moderate bilateral renal atrophy.   Electronically Signed   By: Margarette Canada M.D.   On: 10/13/2014 14:10   Dg Chest Port 1 View  11/10/2014   CLINICAL DATA:  Patient with worsening shortness of breath.  EXAM: PORTABLE CHEST - 1 VIEW  COMPARISON:  Chest radiograph 10/24/2014  FINDINGS: Multi lead pacer apparatus overlies the right hemithorax, leads are stable in position. Stable cardiac and mediastinal contours. Right lung is clear. Chronic elevation of the left hemidiaphragm with stable chronic changes involving the left lung.  IMPRESSION: Stable chest radiograph.  No acute cardiopulmonary process.   Electronically Signed   By: Lovey Newcomer M.D.   On: 11/10/2014 01:22   Dg Ang/ext/uni/or Left  10/12/2014   CLINICAL DATA:  Femoral thrombectomy for embolic occlusion in the left lower extremity.  EXAM: LEFT ANG/EXT/UNI/ OR  COMPARISON:  None.  FINDINGS: Intraoperative image shows a normally patent popliteal artery and normally patent visualized anterior tibial, peroneal and posterior tibial arteries to nearly the level of the ankle. No filling defects are identified.  IMPRESSION: No distal occlusions or filling defects identified at the level of the popliteal and tibial arteries.   Electronically Signed   By: Aletta Edouard M.D.   On: 10/12/2014 14:02    ASSESSMENT AND PLAN  1. Chronic permanent atrial fibrillation with rapid ventricular response. On long-term anticoagulation, now with Apixaban. 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 2. Left ventricular systolic dysfunction with ejection fraction of 35% on echo 3. Chronic systolic heart failure, currently euvolemic. 4. COPD 5.  Slightly elevated troponin secondary to demand ischemia in the setting of atrial fibrillation with RVR  Plan: Our goal is to continue strategy of rate control and adequate anticoagulation for his chronic atrial fibrillation. At the present time his heart rate is not adequately controlled despite increase of his Toprol to 150 mg twice a day. Cannot titrated further due to soft BP.  Continue low-dose digoxin for possible additional additive effect although efficacy is uncertain. Not a candidate for diltiazem because of his left ventricular systolic dysfunction nor a candidate for amiodarone because of prior hyperthyroidism interaction.  Will ask EP to evaluate for further recommendations.    Sueanne Margarita, MD  11/11/2014  9:55 AM

## 2014-11-11 NOTE — Discharge Summary (Signed)
Name: [name] MRN: [id number] DOB: 1945-09-17 69 y.o. PCP: Travis Contes, MD  Date of Admission: 11/09/2014  5:18 PM Date of Discharge: 11/17/2014 Attending Physician: Travis Belt, MD  Discharge Diagnosis:  Chronic atrial fibrillation with RVR E. coli sepsis secondary to UTI Chronic systolic congestive heart failure GERD Hyperlipidemia Depression Hypertension Chronic pain Severe malnutrition COPD   Discharge Medications:   Medication List    STOP taking these medications        warfarin 2.5 MG tablet  Commonly known as:  COUMADIN      TAKE these medications        albuterol 108 (90 BASE) MCG/ACT inhaler  Commonly known as:  VENTOLIN HFA  Inhale 2 puffs into the lungs every 6 (six) hours as needed for wheezing or shortness of breath.     apixaban 5 MG Tabs tablet  Commonly known as:  ELIQUIS  Take 1 tablet (5 mg total) by mouth 2 (two) times daily.     aspirin 81 MG chewable tablet  Chew 1 tablet (81 mg total) by mouth daily.     bisacodyl 10 MG suppository  Commonly known as:  DULCOLAX  Place 1 suppository (10 mg total) rectally daily as needed for severe constipation.     buPROPion 75 MG tablet  Commonly known as:  WELLBUTRIN  Take 1 tablet (75 mg total) by mouth 2 (two) times daily.     ciprofloxacin 500 MG tablet  Commonly known as:  CIPRO  Take 1 tablet (500 mg total) by mouth 2 (two) times daily.     Digoxin 62.5 MCG Tabs  Take 0.0625 mg by mouth daily.     diltiazem 120 MG 24 hr capsule  Commonly known as:  CARDIZEM CD  Take 1 capsule (120 mg total) by mouth daily.     diphenhydrAMINE 2 % cream  Commonly known as:  BENADRYL  Apply 1 application topically 3 (three) times daily as needed for itching.     feeding supplement (ENSURE ENLIVE) Liqd  Take 237 mLs by mouth 2 (two) times daily between meals.     Melatonin 3 MG Caps  Take by mouth at bedtime. For Insomnia     metoprolol 200 MG 24 hr tablet  Commonly known as:   TOPROL-XL  Take 1 tablet (200 mg total) by mouth daily. Take with or immediately following a meal.     omeprazole 40 MG capsule  Commonly known as:  PRILOSEC  Take 40 mg by mouth daily.     oxyCODONE-acetaminophen 10-325 MG per tablet  Commonly known as:  PERCOCET  Take 1 tablet by mouth every 4 (four) hours as needed for pain. Do not take more than 5 doses in a 24 hour period.     rosuvastatin 20 MG tablet  Commonly known as:  CRESTOR  Take 1 tablet (20 mg total) by mouth daily at 6 PM.     senna-docusate 8.6-50 MG per tablet  Commonly known as:  [employer] SENNA PLUS  Take 2 tablets by mouth 2 (two) times daily.     tiotropium 18 MCG inhalation capsule  Commonly known as:  SPIRIVA  Place 1 capsule (18 mcg total) into inhaler and inhale daily.        Disposition and follow-up:   TravisTravis [name] was discharged from [facility] in Stable condition.  At the hospital follow up visit please address:  Chronic a-fib: assess for symptoms on Cardizem & Toprol, adherence to Eliquis  twice daily  E. coli sepsis: completion of ciprofloxacin, resolution of fever  Constipation: assessment for bowel movements  Follow-up Appointments: Follow-up Information    Follow up with Travis Grayer, MD. Go on 11/23/2014.   Specialty:  Cardiology   Why:  1130am   Contact information:   [address] [place] [address] [phone]       Follow up with Travis Finders, MD. Go on 11/23/2014.   Specialty:  Internal Medicine   Why:  130 PM   Contact information:   [place] [phone]       Discharge Instructions: Discharge Instructions    Discharge instructions    Complete by:  As directed   For any problems, please call PCP's office at [phone].     Increase activity slowly    Complete by:  As directed            Consultations:    Procedures Performed:  X-ray Chest Pa And Lateral  10/24/2014   CLINICAL DATA:  Dyspnea   EXAM: CHEST  2 VIEW  COMPARISON:  None.  FINDINGS: Lungs are hyperinflated. There is left basilar pleural plaque. There is left basilar airspace disease unchanged compared with the prior exams likely reflecting scarring. There is no pneumothorax. There is a nodular opacity in the right lower lung which reflect superimposition of the inferior aspect of the scapula overlying a posterior rib. There is stable cardiomegaly. There is thoracic aortic atherosclerosis. There is a dual lead AICD. There is no acute osseous abnormality.  IMPRESSION: No active cardiopulmonary disease.  Chronic changes at the left lung base.   Electronically Signed   By: [name]   On: 10/24/2014 21:48   US Renal  11/13/2014   CLINICAL DATA:  Acute renal injury  EXAM: RENAL / URINARY TRACT ULTRASOUND COMPLETE  COMPARISON:  10/13/2014  FINDINGS: Right Kidney:  Length: 10.4 cm. Echogenicity within normal limits. No mass or hydronephrosis visualized.  Left Kidney:  Length: 10.8 cm. Echogenicity within normal limits. No mass or hydronephrosis visualized.  Bladder:  Mildly decompressed.  No filling defect is noted.  IMPRESSION: No acute abnormality noted.   Electronically Signed   By: [name] M.D.   On: 11/13/2014 17:04   Dg Chest Port 1 View  11/10/2014   CLINICAL DATA:  Patient with worsening shortness of breath.  EXAM: PORTABLE CHEST - 1 VIEW  COMPARISON:  Chest radiograph 10/24/2014  FINDINGS: Multi lead pacer apparatus overlies the right hemithorax, leads are stable in position. Stable cardiac and mediastinal contours. Right lung is clear. Chronic elevation of the left hemidiaphragm with stable chronic changes involving the left lung.  IMPRESSION: Stable chest radiograph.  No acute cardiopulmonary process.   Electronically Signed   By: Lovey Newcomer M.D.   On: 11/10/2014 01:22     Admission HPI: [name] is a 69 year old male with chronic systolic heart failure, chronic atrial fibrillation on oral anticoagulation, hypertension,  hyperlipidemia, GERD, depression who presented to clinic with dizziness, headache.  2 days ago, he reported the onset of dizziness, headache similar to the presentation and prompted his most recent hospitalization to denied any "pounding in his chest." He attempted to use the albuterol inhaler though without relief of his symptoms. He reported his symptoms worsened until today when he could not get out of bed and felt short of breath while trying to take a shower. He presented to his hospital follow-up earlier today with Dr. Ronnald Ramp, and  EKG was notable for atrial fibrillation with RVR. During this interval, he reported chest pain though he felt it was associated with something he ate since it was of a burning nature but denied any fever, chills, sick contacts, abdominal pain, nausea. He was at home with his 2 sons and smoked a pack per day until about a month ago following his most recent hospitalizations denies any illicit drug use.  He was hospitalized 5/24-6/1 for persistent atrial fibrillation with RVR with subsequent embolization in the setting of nonadherence to oral anticoagulation that resulted in STEMI, TIA, lower extremity ischemia and required embolectomy. He was noted to have EF 30-35% with diffuse hypokinesis on echo during that admission  He was rehospitalized earlier this month [6/6-6/9] again for atrial fibrillation with RVR at which time Lopressor 75 mg 3 times daily was changed to Toprol-XL 200 mg daily though he was continued on digoxin 0.0625 mg. Ramipril was discontinued given low blood pressure, and he was to follow-up with Dr. Rayann Heman on 6/29.    Hospital Course by problem list:   Chronic atrial fibrillation with RVR: On admission, no overt inciting factor was identified, and he was started back on apixaban 5 mg twice daily given CHADSVASC score 6 which is consistent with a 9.7 % stroke/TIA/embolism. Cardiology was consulted the following day and recommended increasing Toprol XL from  200 mg once daily to 150 mg twice daily and continuing home digoxin 0.0625 mg. On 6/24, electrophysiology team was consulted and recommended restarting Cardizem CD 180 mg daily as it was thought that his low EF was a consequence of tachycardia, and additional procedures would be deferred given his diffuse venous occlusions; he also failed prior antiarrhythmic therapy with amiodarone [thyroid toxicosis], Multaq, Tikosyn [hyperkalemia]. However, given his concurrent infection, Cardizem and Toprol XL were held out of concern for hypotension with systolic blood pressures decreasing as far as the 70s, so he was started on Lopressor 37.5 mg every 6 hours as needed for tachycardia. On further conversations with Dr. Rayann Heman, his outpatient EP provider, heart rate in the 140s to 150s along with systolic blood pressures in the 90s were permissible given that this was the patient's baseline over the last several years he had gotten to know him. On 6/28, Toprol-XL was changed to 200 mg daily, and he was restarted on Cardizem CD 120 mg daily. Heart rate subsequently improved below 846, and his systolic blood pressures remained above 100 for 48 hours. Please assess his adherence to Eliquis at the time of follow-up.  Escherichia coli sepsis secondary to UTI: On 6/28 [day 1 of his hospital stay], he reported feeling weak and tired. That night, he had a temperature 100.3 Fahrenheit and was found to have leukocytosis of 20,000 on 6/29. He was started on Diflucan 100 mg initially for concern of oropharyngeal candidal infection though was noted to have urine culture and cultures 1/2 notable for Escherichia coli. He was started on aztreonam on 6/24 and then transitioned to ciprofloxacin 6/28 as sensitivities resulted for multiple antibiotics, including ceftriaxone, cefazolin, ciprofloxacin. He was discharged on ciprofloxacin 500mg  twice daily x 3.5 days to complete a total 10 day course of therapy on 7/4.  Chronic systolic CHF: Echo  in May 2016 notable for EF 30-35% with diffuse hypokinesis, likely in the setting of a STEMI. As noted above, it felt this may have been inaccurate in the setting of tachycardia. He was continued on digoxin, Toprol XL, and cardizem CD as noted above along with ASA 81mg  for  peripheral vascular disease.  GERD: Remained stable on home Prilosec 40 mg daily.  Hyperlipidemia: Remained stable on home rosuvastatin 20 mg.  Depression: Remained stable on Wellbutrin 75 mg twice daily.  Hypertension: His systolic BP remained 992E-268T on his final regimen: Toprol XL 200 mg and Cardizen CD 120mg  daily.    Chronic pain: He reports taking Percocet 10/325 mg 4-5 times a day for the pain following his hip procedure. Though he was on Senokot-S 1 tablet as needed for constipation, he did not use it regularly and thus required soap suds enemas twice during his hospitalization. Senokot-S was then scheduled twice daily. Please assess if he has had a bowel movement daily as it impairs his overall functional status.  Severe malnutrition: At his most recent hospitalization, he was noted at BMI 19.2 with reported 24 pound weight loss over the last 5 months. He was continued on Ensure twice daily between meals  COPD: He was continued on home albuterol 2 puffs as needed every 6 hours.    Discharge Vitals:   BP 110/87 mmHg  Pulse 80  Temp(Src) 97.4 F (36.3 C) (Oral)  Resp 19  Ht 6\' 3"  (1.905 m)  Wt 153 lb 9.6 oz (69.673 kg)  BMI 19.20 kg/m2  SpO2 98%  Discharge Labs:  No results found. However, due to the size of the patient record, not all encounters were searched. Please check Results Review for a complete set of results.  Signed: Riccardo Dubin, MD 11/17/2014, 11:21 AM    Services Ordered on Discharge: SNF, Home Health Equipment Ordered on Discharge: None

## 2014-11-11 NOTE — Care Management Note (Addendum)
Case Management Note  Patient Details  Name: [name] MRN: [id number] Date of Birth: [birth year]  Subjective/Objective:   Pt admitted with A fib with RVR                 Action/Plan: Pt is enrolled with [facility] service and active with [facility] for RN, PT, OT, Aide and SW. Pt will discharge home on Eliquis.  CM will assist with Eliquis initiation post discharge.   Expected Discharge Date:                  Expected Discharge Plan:  Home/Self Care  In-House Referral:     Discharge planning Services  CM Consult, Medication Assistance  Post Acute Care Choice:    Choice offered to:     DME Arranged:    DME Agency:     HH Arranged:    HH Agency:     Status of Service:  In process, will continue to follow  Medicare Important Message Given:  No Date Medicare IM Given:    Medicare IM give by:    Date Additional Medicare IM Given:    Additional Medicare Important Message give by:     If discussed at [place] of Stay Meetings, dates discussed:    Additional Comments: 11/11/14 Elenor Quinones, RN, BSN [phone] CM provided free 30 day card.  CM submitted benefit check and was informed copay per month to be $7.40, no prior authorization required.  CM informed pt of copay, pt stated he stopped taking the coumadin at home because he forgot, co pay was not a problem.  Pt stated he would figure out a way to pay for copay for Eliquis. CM requested MD to write order for [facility] to assist with medication needs to reduce risk of readmit due to medication adherence in addition to resumption of previous services as deemed necessary.   Pt stated preferred [facility] [address], CM verified prescription is available for refill.  CM contacted MD and informed that pt is active with [facility] for RN, PT, Aide and SW, requested resumption orders if deemed necessary, also placed a sticky physician sticky note on chart.    Maryclare Labrador, RN 11/11/2014, 10:37 AM

## 2014-11-11 NOTE — Progress Notes (Signed)
Patient ID: [name], male   DOB: 1945-05-22, 69 y.o.   MRN: [id number] Medicine attending: I examined this patient's morning with resident physician Dr. Charlott Rakes. We appreciate ongoing cardiology assistance. He remains in rapid atrial fibrillation. Beta blocker dose increased yesterday. No room for further increase. He will be evaluated by our electrophysiology cardiologist specialist to see if he is a candidate for either cardioversion or a radiofrequency ablation procedure. He is becoming increasingly irritable being in the hospital. He is running low-grade fever up to 100.9. White blood count rose from 7000 on June 8 to 20 thousand today. Fall in platelet count from 311,000  To 118,000. He is on no heparin products. He is being anticoagulated with apixiban. Additional pertinent findings on exam: Candida exudate posterior pharynx. Lungs are clear. Rapid regular cardiac rhythm. Murmur not appreciated at rapid rate. Single IV site left wrist no erythema or exudate. He appears to be developing an infection further contributing to his rapid heart rate. No obvious signs on exam except for probable oral candidiasis. We will obtain cultures of urine, throat, and blood, and begin Diflucan.

## 2014-11-11 NOTE — Consult Note (Signed)
ELECTROPHYSIOLOGY CONSULT NOTE    Patient ID: [name] MRN: [id number], DOB/AGE: [birth year] 69 y.o.  Admit date: 11/09/2014 Date of Consult: 11/11/2014  Primary Physician: Aldine Contes, MD Primary Cardiologist: Antavius Sperbeck Requesting Physician: Radford Pax  Reason for Consultation: AF with RVR  HPI:  [name] is a 69 y.o. male with a past medical history significant for nonischemic cardiomyopathy, tachy-brady syndrome (s/p STJ VVI pacemaker), recurrent DVT and arterial thrombus (on Warfarin), COPD, and CKD. He has failed medical therapy for atrial fibrillation with multaq, amiodarone, and tikoysn. He was admitted (7/24-12/20/13) for RLE arterial thrombosis and underwent embolectomy and fasciotomy at that time.He was hospitalized 5/24-6/1 for persistent atrial fibrillation with RVR with subsequent embolization in the setting of nonadherence to oral anticoagulation that resulted in STEMI, TIA, lower extremity ischemia and required embolectomy. He was noted to have EF 30-35% with diffuse hypokinesis on echo during that admission. He was readmitted on June 6 until 10/27/2014 for atrial fibrillation with rapid ventricular response. At that time he was switched from metoprolol to heart rate to metoprolol succinate 200 mg daily. He was continued on low-dose digoxin 0.0625 mg daily. Because of low blood pressure his ramipril was discontinued during that admission to allow for use of higher dose beta blocker if necessary. The day prior to admission, he felt weak and dizzy and more short of breath while taking a shower. He had an appointment to see his PCP in the office and at that time was noted to be markedly tachycardiac and admission was advised.    He underwent attempted flutter ablation in 2010 but had presumed occlusion/stenosis of the right and left common femoral veins with inability to obtain access.   He currently states that he is fatigued and is having tension headaches.  He is  not having chest pain and states that his shortness of breath is stable. He reports that prior to admission, he was having weakness and dizziness with standing.   Last echo 10/11/14 demonstrated EF 30-35%, diffuse hypokinesis, mild AR, LA 51  AAD history - Amiodarone started 09/2008, TSH down to 0.057 02/2009 Multaq 2010 discontinued 2011 2/2 renal function, restarted 2/12 Tikosyn discontinued 2/2 hyperkalemia  Past Medical History  Diagnosis Date  . Hypertension   . Chronic systolic heart failure     a. NICM - EF 35% with normal cors 2003. b. Last echo 11/2012 - EF 25-30%.  . Depression   . GERD (gastroesophageal reflux disease)   . Portal vein thrombosis   . Avascular necrosis of bones of both hips     Status post bilateral hip replacements  . Gastritis   . Alcohol abuse   . Erectile dysfunction   . Bipolar disorder   . Hydrocele, unspecified   . Lung nodule     Chest CT scan on 12/14/2008 showed a nodular opacity at the left lung base felt to most likely represent scarring.  Follow-up chest CT scan on 06/02/2010 showed parenchymal scarring in the left apex, left  lower lobe, and lingula; some of this scarring at the left base had a nodular appearance, unchanged. Chest 09/2012 - stable.  . 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  Thyroid scan 08/28/2009 was normal with no focal areas of abnormal increased or decreased activity seen; the uptake of I 131 sodium iodide at 24 hours was 5.7%.  TSH and free T4 normalized by 08/16/2009.  . Intestinal obstruction   . COPD (chronic obstructive pulmonary disease)   .  E coli bacteremia   . DVT (deep venous thrombosis)     He has hypercoagulability with multiple prior DVTs  . Pleural plaque     H/o asbestos exposure. Chest CT on 06/02/2010 showed stable extensive calcified pleural plaques involving the left hemithorax, consistent with asbestos related pleural disease.  . Weight loss     Normal  colonoscopy by Dr. Olevia Perches on 11/06/2010.  . Tachycardia-bradycardia syndrome     a. 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 b. St. Jude gen change 2010.  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  . Noncompliance   . Thrombocytopenia   . Osteoarthritis   . Spondylosis   . Anxiety   . CKD (chronic kidney disease)     Renal U/S 12/04/2009 showed no pathological findings. Labs 12/04/2009 include normal ESR, C3, C4; neg ANA; SPEP showed nonspecific increase in the alpha-2 region with no M-spike; UPEP showed no monoclonal free light chains; urine IFE showed polyclonal increase in feree Kappa and/or free Lambda light chains. Baseline Cr reported 1.7-2.5.  . STEMI (ST elevation myocardial infarction) 10/11/2014  . Hyperlipemia      Surgical History:  Past Surgical History  Procedure Laterality Date  . Total hip arthroplasty Right 03/08/2008     By Dr. Hiram Comber  . Tee without cardioversion N/A 05/27/2013    Procedure: TRANSESOPHAGEAL ECHOCARDIOGRAM (TEE);  Surgeon: Candee Furbish, MD;  Location: [facility] ENDOSCOPY;  Service: Cardiovascular;  Laterality: N/A;  . Cardioversion N/A 05/27/2013    Procedure: CARDIOVERSION;  Surgeon: Candee Furbish, MD;  Location: [facility] ENDOSCOPY;  Service: Cardiovascular;  Laterality: N/A;  . Cardiac catheterization  2003    Nl cors, EF 35%  . Embolectomy Right 12/10/2013    Procedure: Right Femoral Embolectomy; Fasciotomy Right Lower Leg with Intraoperative arteriogram;  Surgeon: Rosetta Posner, MD;  Location: [place];  Service: Vascular;  Laterality: Right;  . Intraoperative arteriogram Right 12/10/2013    Procedure: INTRA OPERATIVE ARTERIOGRAM;  Surgeon: Rosetta Posner, MD;  Location: [place];  Service: Vascular;  Laterality: Right;  . Fasciotomy closure Right 12/15/2013    Procedure: LEG FASCIOTOMY CLOSURE;  Surgeon: Rosetta Posner, MD;  Location: [facility] OR;  Service: Vascular;  Laterality: Right;    . Embolectomy Left 10/12/2014    Procedure: Left Femoral Thrombectomy with intraoperative arteriogram;  Surgeon: Rosetta Posner, MD;  Location: [facility] OR;  Service: Vascular;  Laterality: Left;  . Hemiarthroplasty hip Left 09/09/2002    bipolar; Dr. Lafayette Dragon  . Joint replacement    . Insert / replace / remove pacemaker  02/2003    Dr. Roe [place], [place] pulse generator identity SR model [id number] serial [id number]; gen change by Greggory Brandy  . Insert / replace / remove pacemaker  06/17/2008    Lead and generator change w/ St. Jude Medical Tendril ST model [phone] [id number] (serial  [id number]).          Prescriptions prior to admission  Medication Sig Dispense Refill Last Dose  . albuterol (VENTOLIN HFA) 108 (90 BASE) MCG/ACT inhaler Inhale 2 puffs into the lungs every 6 (six) hours as needed for wheezing or shortness of breath. 1 Inhaler 3 Past Week at Unknown time  . aspirin 81 MG chewable tablet Chew 81 mg by mouth daily.   11/09/2014 at Unknown time  . bisacodyl (DULCOLAX) 10 MG suppository Place 10 mg rectally every evening.   11/08/2014 at Unknown time  .  buPROPion (WELLBUTRIN) 75 MG tablet Take 1 tablet (75 mg total) by mouth 2 (two) times daily. 60 tablet 5 11/09/2014 at Unknown time  . digoxin 62.5 MCG TABS Take 0.0625 mg by mouth daily. 30 tablet 2 11/09/2014 at Unknown time  . diphenhydrAMINE (BENADRYL) 2 % cream Apply 1 application topically 3 (three) times daily as needed for itching.   Past Week at Unknown time  . Melatonin 3 MG CAPS Take by mouth at bedtime. For Insomnia   11/08/2014 at Unknown time  . metoprolol (TOPROL-XL) 200 MG 24 hr tablet Take 1 tablet (200 mg total) by mouth daily. Take with or immediately following a meal. 30 tablet 11 11/09/2014 at 0800  . omeprazole (PRILOSEC) 40 MG capsule Take 40 mg by mouth daily.   11/09/2014 at Unknown time  . oxyCODONE-acetaminophen (PERCOCET) 10-325 MG per tablet Take 1 tablet by mouth every 4 (four) hours as needed for pain. Do not take more than 5  doses in a 24 hour period. 150 tablet 0 Past Week at Unknown time  . rosuvastatin (CRESTOR) 20 MG tablet Take 1 tablet (20 mg total) by mouth daily at 6 PM. 30 tablet 2 11/08/2014 at Unknown time  . tiotropium (SPIRIVA) 18 MCG inhalation capsule Place 1 capsule (18 mcg total) into inhaler and inhale daily. 30 capsule 5 11/09/2014 at Unknown time  . warfarin (COUMADIN) 2.5 MG tablet Take 3 tablets (7.5 mg total) by mouth See admin instructions. Take as directed by Dr. Elie Confer (Patient taking differently: Take 7.5 mg by mouth daily at 6 PM. Take as directed by Dr. Elie Confer) 30 tablet 2 11/08/2014 at Unknown time    Inpatient Medications:  . apixaban  5 mg Oral BID  . aztreonam  1 g Intravenous 3 times per day  . aztreonam  2 g Intravenous Once  . buPROPion  75 mg Oral BID  . digoxin  0.0625 mg Oral Daily  . feeding supplement (ENSURE ENLIVE)  237 mL Oral BID BM  . fluconazole  100 mg Oral Daily  . levofloxacin (LEVAQUIN) IV  750 mg Intravenous Once  . [START ON 11/13/2014] levofloxacin (LEVAQUIN) IV  750 mg Intravenous Q48H  . metoprolol  150 mg Oral BID PC  . pantoprazole  40 mg Oral Daily  . rosuvastatin  20 mg Oral q1800  . tiotropium  18 mcg Inhalation Daily  . vancomycin  1,000 mg Intravenous Once  . [START ON 11/12/2014] vancomycin  1,000 mg Intravenous Q24H    Allergies:  Allergies  Allergen Reactions  . Penicillins Rash    History   Social History  . Marital Status: Widowed    Spouse Name: N/A  . Number of Children: 10  . Years of Education: N/A   Occupational History  . RETIRED    Social History Main Topics  . Smoking status: Former Smoker -- 0.33 packs/day for 54 years    Types: Cigarettes    Quit date: 10/11/2014  . Smokeless tobacco: Never Used  . Alcohol Use: 0.0 oz/week    0 Standard drinks or equivalent per week     Comment: 6/22//2016 "h/o etoh abuse; stopped drinking ~ 2012"  . Drug Use: 1.00 per week    Special: Marijuana     Comment: 11/09/2014 "none since  10/11/2014; did smoke marijuana ~ 1 time/month"  . Sexual Activity: Not Currently   Other Topics Concern  . Not on file   Social History Narrative   Works as a Architect part time  Smoker 1 pack last 1.5 days tobacco, smokes marijuana    Denies EtOH x 4 years (on 10/12/12)   9 kids    From [place]   [place] Veteran    12 grade education               Family History  Problem Relation Age of Onset  . Hypertension Mother   . Cancer Brother     Unsure of type.  . Asthma Mother   . Coronary artery disease Mother   . Colon cancer Neg Hx   . Lung cancer Neg Hx   . Prostate cancer Neg Hx   . Stroke      mother  . Heart attack      brother and sister ?age      Review of Systems: All other systems reviewed and are otherwise negative except as noted above.  Physical Exam: Filed Vitals:   11/10/14 2152 11/11/14 0455 11/11/14 0527 11/11/14 0900  BP: 106/78 96/67    Pulse: 130 125    Temp: 99.5 F (37.5 C) 97.8 F (36.6 C)    TempSrc: Oral Oral    Resp: 18 18    Height:      Weight:   145 lb 11.2 oz (66.089 kg)   SpO2: 100% 100%  100%    GEN- The patient is chronically ill appearing, alert and oriented x 3 today.   HEENT: normocephalic, atraumatic; sclera clear, conjunctiva pink; hearing intact; oropharynx clear; neck supple  Lungs- Clear to ausculation bilaterally, normal work of breathing.  No wheezes, rales, rhonchi Heart- Irregular tachycardic rate and rhythm  GI- soft, non-tender, non-distended, bowel sounds present  Extremities- no clubbing, cyanosis, or edema  MS- no significant deformity or atrophy Skin- warm and dry, no rash or lesion Psych- euthymic mood, flat affect Neuro- strength and sensation are intact  Labs:   Lab Results  Component Value Date   WBC 20.0* 11/11/2014   HGB 12.8* 11/11/2014   HCT 38.7* 11/11/2014   MCV 91.9 11/11/2014   PLT 118* 11/11/2014     Recent Labs Lab 11/11/14 0438  NA 137  K 4.6  CL 104  CO2 26  BUN 42*    CREATININE 1.87*  CALCIUM 8.8*  GLUCOSE 109*      Radiology/Studies:  Dg Chest Port 1 View 11/10/2014   CLINICAL DATA:  Patient with worsening shortness of breath.  EXAM: PORTABLE CHEST - 1 VIEW  COMPARISON:  Chest radiograph 10/24/2014  FINDINGS: Multi lead pacer apparatus overlies the right hemithorax, leads are stable in position. Stable cardiac and mediastinal contours. Right lung is clear. Chronic elevation of the left hemidiaphragm with stable chronic changes involving the left lung.  IMPRESSION: Stable chest radiograph.  No acute cardiopulmonary process.   Electronically Signed   By: Lovey Newcomer M.D.   On: 11/10/2014 01:22    UXL:[id number] atrial flutter, rate 135  TELEMETRY: atrial fibrillation with RVR, ventricular rates 120's  DEVICE HISTORY: STJ single chamber PPM implanted 2004; gen change and new RV lead placed 2010  Assessment/Plan: 1.  Longstanding persistent atrial fibrillation [name] has longstanding persistent atrial fibrillation and has failed AAD therapy with Amiodarone (thyroidtoxicosis), Multaq, and Tikosyn (hyperkalemia).  He has multiple medical problems that result in tachycardia.  We will have to allow some degree of tachycardia while trying to optimize his underlying health issues.  He is not a candidate for AVN ablation because of occluded bilateral femoral vein access.  Continue Metoprolol.  Dr  Bryce Kimble to see later today Continue Eliquis for CHADS2VASC of at least 6  2.  Chronic systolic heart failure Medical therapy limited by need for rate control and hypotension Euvolemic on exam  3.  Elevated troponin Likely related to AF with RVR No ischemic work up planned at this time   Signed, Chanetta Marshall, NP 11/11/2014 1:52 PM  I have seen, examined the patient, and reviewed the above assessment and plan.  On exam, tachycardic irregular rhythm. Changes to above are made where necessary.  I have known [name] a long time.  His current rhythm has been mostly  well tolerated for years.  Our ability to manage his rhythm has been complicated largely by compliance.  He has diffuse venous occlusions which have previous limited cardiac access for ablations.  He is therefore not a candidate for EP procedures. I think that at this time, our best option would be to restart cardizem CD 12m daily.  Though EF is low, I think that this is tachycardia mediated and that ultimately he would do better on diltiazem than worse.  Co Sign: JThompson Grayer MD 11/11/2014 4:39 PM

## 2014-11-11 NOTE — Discharge Instructions (Signed)

## 2014-11-12 ENCOUNTER — Other Ambulatory Visit: Payer: Self-pay

## 2014-11-12 DIAGNOSIS — I5022 Chronic systolic (congestive) heart failure: Secondary | ICD-10-CM | POA: Diagnosis present

## 2014-11-12 DIAGNOSIS — E875 Hyperkalemia: Secondary | ICD-10-CM

## 2014-11-12 DIAGNOSIS — A4151 Sepsis due to Escherichia coli [E. coli]: Secondary | ICD-10-CM | POA: Diagnosis not present

## 2014-11-12 DIAGNOSIS — F419 Anxiety disorder, unspecified: Secondary | ICD-10-CM | POA: Diagnosis present

## 2014-11-12 DIAGNOSIS — Z960 Presence of urogenital implants: Secondary | ICD-10-CM | POA: Diagnosis not present

## 2014-11-12 DIAGNOSIS — N390 Urinary tract infection, site not specified: Secondary | ICD-10-CM

## 2014-11-12 DIAGNOSIS — I5042 Chronic combined systolic (congestive) and diastolic (congestive) heart failure: Secondary | ICD-10-CM | POA: Diagnosis not present

## 2014-11-12 DIAGNOSIS — F319 Bipolar disorder, unspecified: Secondary | ICD-10-CM | POA: Diagnosis present

## 2014-11-12 DIAGNOSIS — E873 Alkalosis: Secondary | ICD-10-CM | POA: Diagnosis present

## 2014-11-12 DIAGNOSIS — R7989 Other specified abnormal findings of blood chemistry: Secondary | ICD-10-CM | POA: Diagnosis not present

## 2014-11-12 DIAGNOSIS — K219 Gastro-esophageal reflux disease without esophagitis: Secondary | ICD-10-CM | POA: Diagnosis present

## 2014-11-12 DIAGNOSIS — J449 Chronic obstructive pulmonary disease, unspecified: Secondary | ICD-10-CM | POA: Diagnosis present

## 2014-11-12 DIAGNOSIS — Z880 Allergy status to penicillin: Secondary | ICD-10-CM | POA: Diagnosis not present

## 2014-11-12 DIAGNOSIS — G8929 Other chronic pain: Secondary | ICD-10-CM | POA: Diagnosis present

## 2014-11-12 DIAGNOSIS — J80 Acute respiratory distress syndrome: Secondary | ICD-10-CM | POA: Diagnosis present

## 2014-11-12 DIAGNOSIS — E785 Hyperlipidemia, unspecified: Secondary | ICD-10-CM | POA: Diagnosis present

## 2014-11-12 DIAGNOSIS — B9689 Other specified bacterial agents as the cause of diseases classified elsewhere: Secondary | ICD-10-CM

## 2014-11-12 DIAGNOSIS — I129 Hypertensive chronic kidney disease with stage 1 through stage 4 chronic kidney disease, or unspecified chronic kidney disease: Secondary | ICD-10-CM | POA: Diagnosis present

## 2014-11-12 DIAGNOSIS — Z7901 Long term (current) use of anticoagulants: Secondary | ICD-10-CM | POA: Diagnosis not present

## 2014-11-12 DIAGNOSIS — A419 Sepsis, unspecified organism: Secondary | ICD-10-CM | POA: Diagnosis not present

## 2014-11-12 DIAGNOSIS — I481 Persistent atrial fibrillation: Secondary | ICD-10-CM | POA: Diagnosis present

## 2014-11-12 DIAGNOSIS — I4891 Unspecified atrial fibrillation: Secondary | ICD-10-CM | POA: Diagnosis not present

## 2014-11-12 DIAGNOSIS — K5900 Constipation, unspecified: Secondary | ICD-10-CM | POA: Diagnosis present

## 2014-11-12 DIAGNOSIS — I10 Essential (primary) hypertension: Secondary | ICD-10-CM | POA: Diagnosis not present

## 2014-11-12 DIAGNOSIS — I252 Old myocardial infarction: Secondary | ICD-10-CM | POA: Diagnosis not present

## 2014-11-12 DIAGNOSIS — R7881 Bacteremia: Secondary | ICD-10-CM | POA: Diagnosis present

## 2014-11-12 DIAGNOSIS — E43 Unspecified severe protein-calorie malnutrition: Secondary | ICD-10-CM | POA: Diagnosis present

## 2014-11-12 DIAGNOSIS — R42 Dizziness and giddiness: Secondary | ICD-10-CM | POA: Diagnosis present

## 2014-11-12 DIAGNOSIS — Z950 Presence of cardiac pacemaker: Secondary | ICD-10-CM | POA: Diagnosis not present

## 2014-11-12 DIAGNOSIS — E0590 Thyrotoxicosis, unspecified without thyrotoxic crisis or storm: Secondary | ICD-10-CM | POA: Diagnosis present

## 2014-11-12 DIAGNOSIS — Z681 Body mass index (BMI) 19 or less, adult: Secondary | ICD-10-CM | POA: Diagnosis not present

## 2014-11-12 DIAGNOSIS — D696 Thrombocytopenia, unspecified: Secondary | ICD-10-CM | POA: Diagnosis present

## 2014-11-12 DIAGNOSIS — I639 Cerebral infarction, unspecified: Secondary | ICD-10-CM | POA: Diagnosis present

## 2014-11-12 DIAGNOSIS — Z96643 Presence of artificial hip joint, bilateral: Secondary | ICD-10-CM | POA: Diagnosis present

## 2014-11-12 DIAGNOSIS — N179 Acute kidney failure, unspecified: Secondary | ICD-10-CM | POA: Diagnosis present

## 2014-11-12 DIAGNOSIS — N189 Chronic kidney disease, unspecified: Secondary | ICD-10-CM | POA: Diagnosis present

## 2014-11-12 DIAGNOSIS — B9620 Unspecified Escherichia coli [E. coli] as the cause of diseases classified elsewhere: Secondary | ICD-10-CM | POA: Diagnosis present

## 2014-11-12 DIAGNOSIS — Z86718 Personal history of other venous thrombosis and embolism: Secondary | ICD-10-CM | POA: Diagnosis not present

## 2014-11-12 DIAGNOSIS — Z9981 Dependence on supplemental oxygen: Secondary | ICD-10-CM | POA: Diagnosis not present

## 2014-11-12 DIAGNOSIS — Z87891 Personal history of nicotine dependence: Secondary | ICD-10-CM | POA: Diagnosis not present

## 2014-11-12 DIAGNOSIS — Z8673 Personal history of transient ischemic attack (TIA), and cerebral infarction without residual deficits: Secondary | ICD-10-CM | POA: Diagnosis not present

## 2014-11-12 LAB — BASIC METABOLIC PANEL
Anion gap: 9 (ref 5–15)
Anion gap: 8 (ref 5–15)
BUN: 52 mg/dL — ABNORMAL HIGH (ref 6–20)
BUN: 56 mg/dL — ABNORMAL HIGH (ref 6–20)
CHLORIDE: 100 mmol/L — AB (ref 101–111)
CHLORIDE: 102 mmol/L (ref 101–111)
CO2: 27 mmol/L (ref 22–32)
CO2: 25 mmol/L (ref 22–32)
Calcium: 8.7 mg/dL — ABNORMAL LOW (ref 8.9–10.3)
Calcium: 8.2 mg/dL — ABNORMAL LOW (ref 8.9–10.3)
Creatinine, Ser: 2.22 mg/dL — ABNORMAL HIGH (ref 0.61–1.24)
Creatinine, Ser: 2.08 mg/dL — ABNORMAL HIGH (ref 0.61–1.24)
GFR calc Af Amer: 33 mL/min — ABNORMAL LOW (ref >60)
GFR, EST AFRICAN AMERICAN: 35 mL/min — AB (ref >60)
GFR, EST NON AFRICAN AMERICAN: 28 mL/min — AB (ref >60)
GFR, EST NON AFRICAN AMERICAN: 31 mL/min — AB (ref >60)
Glucose, Bld: 116 mg/dL — ABNORMAL HIGH (ref 65–99)
Glucose, Bld: 108 mg/dL — ABNORMAL HIGH (ref 65–99)
POTASSIUM: 6.2 mmol/L — AB (ref 3.5–5.1)
POTASSIUM: 5.6 mmol/L — AB (ref 3.5–5.1)
Sodium: 136 mmol/L (ref 135–145)
Sodium: 135 mmol/L (ref 135–145)

## 2014-11-12 LAB — TROPONIN I
Troponin I: 0.12 ng/mL — ABNORMAL HIGH (ref <0.031)
Troponin I: 0.21 ng/mL — ABNORMAL HIGH (ref <0.031)
Troponin I: 0.03 ng/mL (ref <0.031)

## 2014-11-12 LAB — CBC
HCT: 38.7 % — ABNORMAL LOW (ref 39.0–52.0)
Hemoglobin: 12.7 g/dL — ABNORMAL LOW (ref 13.0–17.0)
MCH: 30.2 pg (ref 26.0–34.0)
MCHC: 32.8 g/dL (ref 30.0–36.0)
MCV: 92.1 fL (ref 78.0–100.0)
Platelets: 110 10*3/uL — ABNORMAL LOW (ref 150–400)
RBC: 4.2 MIL/uL — ABNORMAL LOW (ref 4.22–5.81)
RDW: 15.1 % (ref 11.5–15.5)
WBC: 20.2 10*3/uL — ABNORMAL HIGH (ref 4.0–10.5)

## 2014-11-12 LAB — DIGOXIN LEVEL: DIGOXIN LVL: 0.6 ng/mL — AB (ref 0.8–2.0)

## 2014-11-12 LAB — LACTIC ACID, PLASMA: LACTIC ACID, VENOUS: 1 mmol/L (ref 0.5–2.0)

## 2014-11-12 LAB — MRSA PCR SCREENING: MRSA by PCR: NEGATIVE

## 2014-11-12 MED ORDER — DEXTROSE 5 % IV SOLN
1.0000 g | Freq: Three times a day (TID) | INTRAVENOUS | Status: DC
Start: 2014-11-12 — End: 2014-11-15
  Administered 2014-11-12 – 2014-11-15 (×10): 1 g via INTRAVENOUS
  Filled 2014-11-12 (×12): qty 1

## 2014-11-12 MED ORDER — SODIUM CHLORIDE 0.9 % IV BOLUS (SEPSIS)
250.0000 mL | Freq: Once | INTRAVENOUS | Status: AC
Start: 2014-11-12 — End: 2014-11-12
  Administered 2014-11-12: 250 mL via INTRAVENOUS

## 2014-11-12 MED ORDER — SODIUM CHLORIDE 0.9 % IV BOLUS (SEPSIS)
250.0000 mL | Freq: Once | INTRAVENOUS | Status: AC
  Administered 2014-11-12: 250 mL via INTRAVENOUS

## 2014-11-12 MED ORDER — SODIUM CHLORIDE 0.9 % IV SOLN
INTRAVENOUS | Status: DC
  Administered 2014-11-12: 12:00:00 via INTRAVENOUS

## 2014-11-12 MED ORDER — SODIUM CHLORIDE 0.9 % IV SOLN
1.0000 g | Freq: Once | INTRAVENOUS | Status: AC
  Administered 2014-11-12: 1 g via INTRAVENOUS
  Filled 2014-11-12: qty 10

## 2014-11-12 MED ORDER — SODIUM POLYSTYRENE SULFONATE 15 GM/60ML PO SUSP
15.0000 g | Freq: Once | ORAL | Status: AC
  Administered 2014-11-12: 15 g via ORAL
  Filled 2014-11-12: qty 60

## 2014-11-12 MED ORDER — METOPROLOL TARTRATE 1 MG/ML IV SOLN
5.0000 mg | Freq: Once | INTRAVENOUS | Status: AC
  Administered 2014-11-12: 5 mg via INTRAVENOUS

## 2014-11-12 MED ORDER — ACETAMINOPHEN 325 MG PO TABS
650.0000 mg | ORAL_TABLET | Freq: Four times a day (QID) | ORAL | Status: DC | PRN
  Administered 2014-11-12: 650 mg via ORAL
  Administered 2014-11-16 (×3): 325 mg via ORAL
  Filled 2014-11-12 (×4): qty 2

## 2014-11-12 MED ORDER — SODIUM CHLORIDE 0.9 % IV BOLUS (SEPSIS)
500.0000 mL | Freq: Once | INTRAVENOUS | Status: AC
  Administered 2014-11-12: 500 mL via INTRAVENOUS

## 2014-11-12 MED ORDER — METOPROLOL TARTRATE 1 MG/ML IV SOLN
INTRAVENOUS | Status: AC
  Filled 2014-11-12: qty 5

## 2014-11-12 MED ORDER — LEVOFLOXACIN IN D5W 750 MG/150ML IV SOLN
750.0000 mg | INTRAVENOUS | Status: DC
  Administered 2014-11-12: 750 mg via INTRAVENOUS
  Filled 2014-11-12: qty 150

## 2014-11-12 NOTE — Progress Notes (Signed)
PT Cancellation Note  Patient Details Name: [name] MRN: [id number] DOB: 30-Oct-1945   Cancelled Treatment:    Reason Eval/Treat Not Completed: Medical issues which prohibited therapy. Noted that potassium is elevated to 6.2 and troponin is trending up. Will hold PT eval at this time and check back for medical appropriateness to participate.    Rolinda Roan 11/12/2014, 7:30 AM   Rolinda Roan, PT, DPT Acute Rehabilitation Services Pager: [phone]

## 2014-11-12 NOTE — Progress Notes (Signed)
Paged on call MD about positive blood culture and potassium 6.2. Waiting for response.

## 2014-11-12 NOTE — Progress Notes (Signed)
Per MD Daryll Drown pt to transfer to stepdown due to hypotension and sepsis for close monitoring. Per step down RN request, Rapid Response RN to bedside to assess patient for appropriateness. Pt mentating well and will transfer to step down. BP 70-80/40-50's. HR 60-70's, pt clammy and linens changed. Cardizem and metoprolol held this am. Continues on IV antibiotics. Encouraging PO's as pt is refusing ensure today. Will call report to 3S and continue to monitor.

## 2014-11-12 NOTE — Progress Notes (Signed)
Report given to 3S RN Whitney.

## 2014-11-12 NOTE — Plan of Care (Signed)
Problem: Phase I Progression Outcomes Goal: Pain controlled with appropriate interventions Outcome: Not Progressing Unable to give pain medication at this time due to decreased blood pressure.

## 2014-11-12 NOTE — Progress Notes (Addendum)
Subjective:  Pt hypotensive overnight BP84/57 and given small 266ml bolus.  Afebrile.  BP this AM not improved, still 84/42. He was given another 291ml bolus and will start IVF at 164ml/h.    He states he doesn't feel well and is very tired.  Still c/o dysuria and now HA.  Will be transferred to SDU for closer monitoring, low threshold to transfer to ICU.     Objective:  Vital signs in last 24 hours: Filed Vitals:   11/11/14 1512 11/11/14 1933 11/12/14 0209 11/12/14 0514  BP: 120/84 103/70 84/57 76/52   Pulse: 144 136 74   Temp: 100.2 F (37.9 C) 98.4 F (36.9 C) 98.9 F (37.2 C) 98.7 F (37.1 C)  TempSrc: Oral Oral Oral Oral  Resp: 19 18  18   Height:      Weight:      SpO2: 100% 100% 100% 100%   Weight change:   Intake/Output Summary (Last 24 hours) at 11/12/14 0840 Last data filed at 11/12/14 0555  Gross per 24 hour  Intake      0 ml  Output    760 ml  Net   -760 ml   General: thin elderly frail African American male resting in bed in NAD HEENT: EOMI, no scleral icterus, oropharynx clear Cardiac: irregular rate, no rubs, murmurs or gallops Pulm: CTAB Abd: soft, suprapubic tenderness, +BS Ext: warm, no LE edema Neuro: responds to questions appropriately; moving all extremities freely Psych: normal affect    Lab Results: Basic Metabolic Panel:  Recent Labs Lab 11/11/14 0438 11/12/14 0436  NA 137 136  K 4.6 6.2*  CL 104 100*  CO2 26 27  GLUCOSE 109* 116*  BUN 42* 52*  CREATININE 1.87* 2.22*  CALCIUM 8.8* 8.7*   Cardiac Enzymes:  Recent Labs Lab 11/11/14 1255 11/11/14 1944 11/12/14 0436  TROPONINI 0.04* 0.06* 0.12*   Coagulation:  Recent Labs Lab 11/09/14 1628  INR 1.9   Studies/Results: No results found. Medications: I have reviewed the patient's current medications. Scheduled Meds: . apixaban  5 mg Oral BID  . aztreonam  1 g Intravenous 3 times per day  . buPROPion  75 mg Oral BID  . digoxin  0.0625 mg Oral Daily  . diltiazem  180  mg Oral Daily  . feeding supplement (ENSURE ENLIVE)  237 mL Oral BID BM  . fluconazole  100 mg Oral Daily  . levofloxacin (LEVAQUIN) IV  750 mg Intravenous Q48H  . metoprolol  150 mg Oral BID PC  . pantoprazole  40 mg Oral Daily  . rosuvastatin  20 mg Oral q1800  . sodium chloride  250 mL Intravenous Once  . tiotropium  18 mcg Inhalation Daily   Continuous Infusions: . sodium chloride     PRN Meds:.ipratropium-albuterol, [DISCONTINUED] oxyCODONE-acetaminophen **AND** oxyCODONE, ramelteon, senna-docusate Assessment/Plan:  GNR sepsis due to UTI  Pt hypotensive this AM given 27ml bolus x 3.  Afebrile overnight but cont leukocytosis this  AM of 20.2.  Pt c/o dysuria and HA.  Blood cx growing GNR and UA with many bacteria.   He was started on levaquin and aztreonam for urosepsis.  Has h/o E.coli UTI in the past sensitive to levaquin.  Lactic acid wnl.   -cont levaquin and aztreonam  -cont NS 152ml/h   -give small boluses with MAP goal ~65 -await urine and blood cx  -transfer to SDU for closer monitoring  -low threshold to transfer to ICU   Elevated troponins Denies CP.  Likely demand in  the setting of acute sepsis.  Cards following. -cont to trend   Mild thrombocytopenia Plt count down to 110 this AM.  Likely d/t sepsis but may also be abx.  -monitor  Hyperkalemia Likely d/t AKI and/or BB.  Given kayexalate x1 but K still elevated.  Ca gluconate given but no EKG changes.   -kayexalate x 1 now -monitor   Chronic atrial fibrillation with RVR Cards consulted for difficult to control afib, now on toprol and cardizem.  However, with hypotension, we will have to hold these until his BP stabilizes.  -cont digoxin -cont eliquis  -cards following, appreciate recommendations  Chronic systolic CHF LVEF 67-12% with diffuse hypokinesis.  RV systolic function reduced.   -gentle IVF given hypotension and low EF   Depression Meds include Wellbutrin 75 mg twice daily. -cont home  medications  Chronic pain Percocet 10/325 mg 4-5 times a day for the pain following his hip procedure. -cont home meds with holding parameters   COPD He is using his home albuterol in the room instead of asking for it which is why it appeared as though he hadn't been receiving treatment.  -encourage him to ask for nebulizer treatments  FEN:  -Diet: Regular  VTE ppx Eliquis   CODE STATUS: full code -Defer to Alafaya and daughter Davy Pique [phone] if patients lacks decision-making capacity -Confirmed with patient on admission  Dispo: Disposition is deferred at this time, awaiting improvement of current medical problems.    The patient does have a current PCP (Aldine Contes, MD) and does need an [facility] hospital follow-up appointment after discharge.  LOS: 3 days   Jones Bales, MD 11/12/2014, 8:40 AM

## 2014-11-12 NOTE — Progress Notes (Addendum)
SUBJECTIVE:  Generalized fatigue  OBJECTIVE:   Vitals:   Filed Vitals:   11/11/14 1512 11/11/14 1933 11/12/14 0209 11/12/14 0514  BP: 120/84 103/70 84/57 76/52   Pulse: 144 136 74   Temp: 100.2 F (37.9 C) 98.4 F (36.9 C) 98.9 F (37.2 C) 98.7 F (37.1 C)  TempSrc: Oral Oral Oral Oral  Resp: 19 18  18   Height:      Weight:      SpO2: 100% 100% 100% 100%   I&O's:   Intake/Output Summary (Last 24 hours) at 11/12/14 0915 Last data filed at 11/12/14 0555  Gross per 24 hour  Intake      0 ml  Output    660 ml  Net   -660 ml   TELEMETRY: Reviewed telemetry pt in atrial fibrillation with CVR:     PHYSICAL EXAM General: Well developed, well nourished, in no acute distress Head: Eyes PERRLA, No xanthomas.   Normal cephalic and atramatic  Lungs:   Clear bilaterally to auscultation and percussion. Heart:   Irregularly irregular S1 S2 Pulses are 2+ & equal. Abdomen: Bowel sounds are positive, abdomen soft and non-tender without masses  Extremities:   No clubbing, cyanosis or edema.  DP +1 Neuro: Alert and oriented X 3. Psych:  Good affect, responds appropriately   LABS: Basic Metabolic Panel:  Recent Labs  11/11/14 0438 11/12/14 0436  NA 137 136  K 4.6 6.2*  CL 104 100*  CO2 26 27  GLUCOSE 109* 116*  BUN 42* 52*  CREATININE 1.87* 2.22*  CALCIUM 8.8* 8.7*   Liver Function Tests: No results for input(s): AST, ALT, ALKPHOS, BILITOT, PROT, ALBUMIN in the last 72 hours. No results for input(s): LIPASE, AMYLASE in the last 72 hours. CBC:  Recent Labs  11/11/14 0438 11/12/14 0436  WBC 20.0* 20.2*  HGB 12.8* 12.7*  HCT 38.7* 38.7*  MCV 91.9 92.1  PLT 118* 110*   Cardiac Enzymes:  Recent Labs  11/11/14 1255 11/11/14 1944 11/12/14 0436  TROPONINI 0.04* 0.06* 0.12*   BNP: Invalid input(s): POCBNP D-Dimer: No results for input(s): DDIMER in the last 72 hours. Hemoglobin A1C: No results for input(s): HGBA1C in the last 72 hours. Fasting Lipid  Panel: No results for input(s): CHOL, HDL, LDLCALC, TRIG, CHOLHDL, LDLDIRECT in the last 72 hours. Thyroid Function Tests: No results for input(s): TSH, T4TOTAL, T3FREE, THYROIDAB in the last 72 hours.  Invalid input(s): FREET3 Anemia Panel: No results for input(s): VITAMINB12, FOLATE, FERRITIN, TIBC, IRON, RETICCTPCT in the last 72 hours. Coag Panel:   Lab Results  Component Value Date   INR 1.9 11/09/2014   INR 2.20* 10/27/2014   INR 2.06* 10/26/2014    RADIOLOGY: X-ray Chest Pa And Lateral  10/24/2014   CLINICAL DATA:  Dyspnea  EXAM: CHEST  2 VIEW  COMPARISON:  None.  FINDINGS: Lungs are hyperinflated. There is left basilar pleural plaque. There is left basilar airspace disease unchanged compared with the prior exams likely reflecting scarring. There is no pneumothorax. There is a nodular opacity in the right lower lung which reflect superimposition of the inferior aspect of the scapula overlying a posterior rib. There is stable cardiomegaly. There is thoracic aortic atherosclerosis. There is a dual lead AICD. There is no acute osseous abnormality.  IMPRESSION: No active cardiopulmonary disease.  Chronic changes at the left lung base.   Electronically Signed   By: Kathreen Devoid   On: 10/24/2014 21:48   Ct Chest W Contrast  10/13/2014  CLINICAL DATA:  69 year old male with unexplained weight loss. Patient with myocardial infarction, CVA, multiple arch O emboli. Acute on chronic kidney disease.  EXAM: CT CHEST, ABDOMEN, AND PELVIS WITH CONTRAST  TECHNIQUE: Multidetector CT imaging of the chest, abdomen and pelvis was performed following the standard protocol during bolus administration of intravenous contrast.  CONTRAST:  74mL OMNIPAQUE IOHEXOL 300 MG/ML  SOLN  COMPARISON:  06/02/2013 and prior chest CTs  FINDINGS: CT CHEST FINDINGS  Mediastinum/Nodes: Cardiomegaly again noted. Moderate coronary artery calcifications are identified. The ascending aorta measures 3.7 cm and greatest diameter. No  enlarged lymph nodes are identified. There is no evidence of pericardial effusion.  Lungs/Pleura: Dependent and bibasilar atelectasis/ scarring again noted. Scarring within the mid and lower left lung again identified. Patchy ground-glass opacities within the left upper lobe may represent aspiration or for pneumonia. There is no evidence of suspicious mass or nodule. COPD/emphysema again identified. Left posterior pleural calcifications are again identified.  Musculoskeletal: No acute or suspicious abnormalities.  CT ABDOMEN AND PELVIS FINDINGS  Hepatobiliary: The liver and gallbladder are unremarkable. There is no evidence of biliary dilatation.  Pancreas: Unremarkable  Spleen: Unremarkable  Adrenals/Urinary Tract: Moderate bilateral renal atrophy and cortical thinning noted, right greater than left. There is no evidence of hydronephrosis, renal mass or definite renal calculi.  Stomach/Bowel: There is no evidence of bowel obstruction. No definite bowel wall thickening noted. No dilated bowel loops are present. Hip replacement artifact obscures detail within the pelvis.  Vascular/Lymphatic: No enlarged lymph nodes or abdominal aortic aneurysm identified. Calcifications within the IVC, iliac and femoral veins noted.  Reproductive: Prostate not well visualized.  Other: No free fluid, abscess or pneumoperitoneum.  Musculoskeletal: Mild stranding in the left inguinal region is likely postoperative. Bilateral hip replacements identified. No acute bony abnormalities are identified. Degenerative changes in the lumbar spine and bilateral L5 pars defects again noted.  IMPRESSION: No evidence of primary malignancy.  Patchy left upper lobe ground-glass opacities - possible aspiration versus pneumonia.  Cardiomegaly, coronary artery disease, ectatic ascending thoracic aorta, COPD/emphysema and moderate bilateral renal atrophy.   Electronically Signed   By: Margarette Canada M.D.   On: 10/13/2014 14:10   Ct Abdomen Pelvis W  Contrast  10/13/2014   CLINICAL DATA:  69 year old male with unexplained weight loss. Patient with myocardial infarction, CVA, multiple arch O emboli. Acute on chronic kidney disease.  EXAM: CT CHEST, ABDOMEN, AND PELVIS WITH CONTRAST  TECHNIQUE: Multidetector CT imaging of the chest, abdomen and pelvis was performed following the standard protocol during bolus administration of intravenous contrast.  CONTRAST:  9mL OMNIPAQUE IOHEXOL 300 MG/ML  SOLN  COMPARISON:  06/02/2013 and prior chest CTs  FINDINGS: CT CHEST FINDINGS  Mediastinum/Nodes: Cardiomegaly again noted. Moderate coronary artery calcifications are identified. The ascending aorta measures 3.7 cm and greatest diameter. No enlarged lymph nodes are identified. There is no evidence of pericardial effusion.  Lungs/Pleura: Dependent and bibasilar atelectasis/ scarring again noted. Scarring within the mid and lower left lung again identified. Patchy ground-glass opacities within the left upper lobe may represent aspiration or for pneumonia. There is no evidence of suspicious mass or nodule. COPD/emphysema again identified. Left posterior pleural calcifications are again identified.  Musculoskeletal: No acute or suspicious abnormalities.  CT ABDOMEN AND PELVIS FINDINGS  Hepatobiliary: The liver and gallbladder are unremarkable. There is no evidence of biliary dilatation.  Pancreas: Unremarkable  Spleen: Unremarkable  Adrenals/Urinary Tract: Moderate bilateral renal atrophy and cortical thinning noted, right greater than left. There is no  evidence of hydronephrosis, renal mass or definite renal calculi.  Stomach/Bowel: There is no evidence of bowel obstruction. No definite bowel wall thickening noted. No dilated bowel loops are present. Hip replacement artifact obscures detail within the pelvis.  Vascular/Lymphatic: No enlarged lymph nodes or abdominal aortic aneurysm identified. Calcifications within the IVC, iliac and femoral veins noted.  Reproductive:  Prostate not well visualized.  Other: No free fluid, abscess or pneumoperitoneum.  Musculoskeletal: Mild stranding in the left inguinal region is likely postoperative. Bilateral hip replacements identified. No acute bony abnormalities are identified. Degenerative changes in the lumbar spine and bilateral L5 pars defects again noted.  IMPRESSION: No evidence of primary malignancy.  Patchy left upper lobe ground-glass opacities - possible aspiration versus pneumonia.  Cardiomegaly, coronary artery disease, ectatic ascending thoracic aorta, COPD/emphysema and moderate bilateral renal atrophy.   Electronically Signed   By: Margarette Canada M.D.   On: 10/13/2014 14:10   Dg Chest Port 1 View  11/10/2014   CLINICAL DATA:  Patient with worsening shortness of breath.  EXAM: PORTABLE CHEST - 1 VIEW  COMPARISON:  Chest radiograph 10/24/2014  FINDINGS: Multi lead pacer apparatus overlies the right hemithorax, leads are stable in position. Stable cardiac and mediastinal contours. Right lung is clear. Chronic elevation of the left hemidiaphragm with stable chronic changes involving the left lung.  IMPRESSION: Stable chest radiograph.  No acute cardiopulmonary process.   Electronically Signed   By: Lovey Newcomer M.D.   On: 11/10/2014 01:22   ASSESSMENT AND PLAN  1. Chronic permanent atrial fibrillation with rapid ventricular response. Heart rate now improved in the 80-90's after starting CCB.  On long-term anticoagulation, now with Apixaban. Prior history of embolic myocardial infarction and emboli to his legs during periods of medication noncompliance.  He has failed AAD therapy with Amiodarone (thyroidtoxicosis), Multaq, and Tikosyn (hyperkalemia). He has multiple medical problems that result in tachycardia. Per EP, we will have to allow some degree of tachycardia while trying to optimize his underlying health issues. He is not a candidate for AVN ablation because of occluded bilateral femoral vein access. Continue  Metoprolol.  Cannot titrate further due to soft BP. Continue low-dose digoxin for possible additional additive effect although efficacy is uncertain. Restarted on Cardizem despite low EF per EP since there are no other options available.   2. Left ventricular systolic dysfunction with ejection fraction of 35% on echo 3. Chronic systolic heart failure, currently euvolemic. 4. COPD 5. Slightly elevated troponin secondary to demand ischemia in the setting of atrial fibrillation with RVR 6.  Hyperkalemia - per primary team - given Kayexalate this am 7.  Acute on chronic kidney disease with creatinine increased - per primary team.  Check dig level today with worsening renal function   Sueanne Margarita, MD  11/12/2014  9:15 AM

## 2014-11-12 NOTE — Progress Notes (Signed)
On call Dr. [name] paged about patient low Bp and complain of blurred vision. Order received to give 250 IV bolus and strict I&O's. Nurse will continue to monitor for any change. Will recheck BP post IV fluid completion.

## 2014-11-12 NOTE — Progress Notes (Signed)
Pt transferred to 3S Stepdown unit via bed with belongings per order/recommendation from Dr. Daryll Drown.  Additional bolus of 500cc given on route to step down-RN Whitney aware.

## 2014-11-12 NOTE — Progress Notes (Signed)
  Date: 11/12/2014  Patient name: [name]  Medical record number: [id number]  Date of birth: [birth year]   This patient's plan of care was discussed with the house staff. Please see Dr. Ferd Glassing note for complete details. I concur with her findings.  Patient doing poorly today due to GNR sepsis.  He likely has sepsis due to an underlying urinary infection given his UA results.  He does have somewhat soft blood pressure at baseline, but now, in the setting of acute infection, this must be monitored closely.  SDU transfer is appropriate as noted in Dr. Ferd Glassing note.  His Afib is controlled rate wise at this moment.  Cardiology is following.    Sid Falcon, MD 11/12/2014, 10:27 AM

## 2014-11-12 NOTE — Progress Notes (Signed)
ANTIBIOTIC CONSULT NOTE - INITIAL  Pharmacy Consult for levaquin, aztreonam Indication: GNR bacteremia/sepsis  Allergies  Allergen Reactions  . Penicillins Rash    Patient Measurements: Height: _0  (190.5 cm) Weight: 145 lb 11.2 oz (66.089 kg) IBW/kg (Calculated) : 84.5  Vital Signs: Temp: 98.7 F (37.1 C) (06/25 0514) Temp Source: Oral (06/25 0514) BP: 76/52 mmHg (06/25 0514) Pulse Rate: 74 (06/25 0209) Intake/Output from previous day: 06/24 0701 - 06/25 0700 In: -  Out: 760 [Urine:760] Intake/Output from this shift:    Labs:  Recent Labs  11/10/14 0435 11/11/14 0438 11/12/14 0436  WBC  --  20.0* 20.2*  HGB  --  12.8* 12.7*  PLT  --  118* 110*  CREATININE 1.77* 1.87* 2.22*   Estimated Creatinine Clearance: 28.9 mL/min (by C-G formula based on Cr of 2.22). No results for input(s): VANCOTROUGH, VANCOPEAK, VANCORANDOM, GENTTROUGH, GENTPEAK, GENTRANDOM, TOBRATROUGH, TOBRAPEAK, TOBRARND, AMIKACINPEAK, AMIKACINTROU, AMIKACIN in the last 72 hours.   Microbiology: Recent Results (from the past 720 hour(s))  Culture, blood (routine x 2)     Status: None (Preliminary result)   Collection Time: 11/11/14 12:55 PM  Result Value Ref Range Status   Specimen Description BLOOD RIGHT FOREARM  Final   Special Requests BOTTLES DRAWN AEROBIC ONLY 8CC  Final   Culture  Setup Time   Final    GRAM NEGATIVE RODS AEROBIC BOTTLE ONLY CRITICAL RESULT CALLED TO, READ BACK BY AND VERIFIED WITH: TO TTEJINU(RN) BY TCLEVELAND 11/12/14 AT 6:01AM    Culture GRAM NEGATIVE RODS  Final   Report Status PENDING  Incomplete    Medical History: Past Medical History  Diagnosis Date  . Hypertension   . Chronic systolic heart failure     a. NICM - EF 35% with normal cors 2003. b. Last echo 11/2012 - EF 25-30%.  . Depression   . GERD (gastroesophageal reflux disease)   . Portal vein thrombosis   . Avascular necrosis of bones of both hips     Status post bilateral hip replacements  .  Gastritis   . Alcohol abuse   . Erectile dysfunction   . Bipolar disorder   . Hydrocele, unspecified   . Lung nodule     Chest CT scan on 12/14/2008 showed a nodular opacity at the left lung base felt to most likely represent scarring.  Follow-up chest CT scan on 06/02/2010 showed parenchymal scarring in the left apex, left  lower lobe, and lingula; some of this scarring at the left base had a nodular appearance, unchanged. Chest 09/2012 - stable.  . 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  Thyroid scan 08/28/2009 was normal with no focal areas of abnormal increased or decreased activity seen; the uptake of I 131 sodium iodide at 24 hours was 5.7%.  TSH and free T4 normalized by 08/16/2009.  . Intestinal obstruction   . COPD (chronic obstructive pulmonary disease)   . E coli bacteremia   . DVT (deep venous thrombosis)     He has hypercoagulability with multiple prior DVTs  . Pleural plaque     H/o asbestos exposure. Chest CT on 06/02/2010 showed stable extensive calcified pleural plaques involving the left hemithorax, consistent with asbestos related pleural disease.  . Weight loss     Normal colonoscopy by Dr. Olevia Perches on 11/06/2010.  . Tachycardia-bradycardia syndrome     a. 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 b. [employer] gen change 2010.  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  . Noncompliance   . Thrombocytopenia   . Osteoarthritis   . Spondylosis   . Anxiety   . CKD (chronic kidney disease)     Renal U/S 12/04/2009 showed no pathological findings. Labs 12/04/2009 include normal ESR, C3, C4; neg ANA; SPEP showed nonspecific increase in the alpha-2 region with no M-spike; UPEP showed no monoclonal free light chains; urine IFE showed polyclonal increase in feree Kappa and/or free Lambda light chains. Baseline Cr reported 1.7-2.5.  . STEMI (ST  elevation myocardial infarction) 10/11/2014  . Hyperlipemia     Medications:  Anti-infectives    Start     Dose/Rate Route Frequency Ordered Stop   11/13/14 1400  levofloxacin (LEVAQUIN) IVPB 750 mg  Status:  Discontinued     750 mg 100 mL/hr over 90 Minutes Intravenous Every 48 hours 11/11/14 1314 11/11/14 1651   11/12/14 1400  vancomycin (VANCOCIN) IVPB 1000 mg/200 mL premix  Status:  Discontinued     1,000 mg 200 mL/hr over 60 Minutes Intravenous Every 24 hours 11/11/14 1314 11/11/14 1643   11/12/14 0900  levofloxacin (LEVAQUIN) IVPB 750 mg     750 mg 100 mL/hr over 90 Minutes Intravenous Every 48 hours 11/12/14 0825     11/12/14 0900  aztreonam (AZACTAM) 1 g in dextrose 5 % 50 mL IVPB     1 g 100 mL/hr over 30 Minutes Intravenous 3 times per day 11/12/14 0826     11/11/14 2200  aztreonam (AZACTAM) 1 g in dextrose 5 % 50 mL IVPB  Status:  Discontinued     1 g 100 mL/hr over 30 Minutes Intravenous 3 times per day 11/11/14 1314 11/11/14 1643   11/11/14 1800  levofloxacin (LEVAQUIN) tablet 250 mg  Status:  Discontinued     250 mg Oral Daily-1800 11/11/14 1706 11/12/14 0825   11/11/14 1330  levofloxacin (LEVAQUIN) IVPB 750 mg  Status:  Discontinued     750 mg 100 mL/hr over 90 Minutes Intravenous  Once 11/11/14 1253 11/11/14 1645   11/11/14 1330  aztreonam (AZACTAM) 2 g in dextrose 5 % 50 mL IVPB  Status:  Discontinued     2 g 100 mL/hr over 30 Minutes Intravenous  Once 11/11/14 1253 11/11/14 1644   11/11/14 1330  vancomycin (VANCOCIN) IVPB 1000 mg/200 mL premix  Status:  Discontinued     1,000 mg 200 mL/hr over 60 Minutes Intravenous  Once 11/11/14 1253 11/11/14 1643   11/11/14 1200  fluconazole (DIFLUCAN) tablet 100 mg     100 mg Oral Daily 11/11/14 1053 11/18/14 0959     Assessment: 68 yom initially presented on 11/09/2014 with dizziness and headache. Now to start aztreonam for 1/2 GNR on blood culture/sepsis. Started on levaquin on 6/24 for likely UTI. Tmax 100.2 and WBC remain  elevated at 20.2. Scr elevated and trending up to 2.22.   Aztreo 6/25>> Levaquin 6/24>> Flucon 6/24>>  6/24 BCx2>>1/2 GNR 6/24 UCx>>  Goal of Therapy:  Resolution of infection  Plan:  - Levaquin 749m IV Q48H - Aztreonam 1gm IV Q8H - F/u renal fxn, temp, WBC, clinical status  - F/u C&S to narrow antibiotics  Nickalas Mccarrick K. NVelva Harman PharmD, BLocust GroveClinical Pharmacist - Resident Pager: [phone]: [phone]/25/2016 8:33 AM

## 2014-11-13 ENCOUNTER — Inpatient Hospital Stay (HOSPITAL_COMMUNITY): Payer: Commercial Managed Care - HMO

## 2014-11-13 DIAGNOSIS — I130 Hypertensive heart and chronic kidney disease with heart failure and stage 1 through stage 4 chronic kidney disease, or unspecified chronic kidney disease: Secondary | ICD-10-CM

## 2014-11-13 DIAGNOSIS — N183 Chronic kidney disease, stage 3 (moderate): Secondary | ICD-10-CM

## 2014-11-13 DIAGNOSIS — N179 Acute kidney failure, unspecified: Secondary | ICD-10-CM

## 2014-11-13 LAB — URINE CULTURE
Culture: >=100000
SPECIAL REQUESTS: NORMAL

## 2014-11-13 LAB — BASIC METABOLIC PANEL
Anion gap: 8 (ref 5–15)
BUN: 45 mg/dL — ABNORMAL HIGH (ref 6–20)
CO2: 25 mmol/L (ref 22–32)
CREATININE: 1.95 mg/dL — AB (ref 0.61–1.24)
Calcium: 7.9 mg/dL — ABNORMAL LOW (ref 8.9–10.3)
Chloride: 103 mmol/L (ref 101–111)
GFR calc non Af Amer: 33 mL/min — ABNORMAL LOW (ref >60)
GFR, EST AFRICAN AMERICAN: 38 mL/min — AB (ref >60)
Glucose, Bld: 105 mg/dL — ABNORMAL HIGH (ref 65–99)
Potassium: 4.3 mmol/L (ref 3.5–5.1)
SODIUM: 136 mmol/L (ref 135–145)

## 2014-11-13 LAB — CBC
HEMATOCRIT: 36.5 % — AB (ref 39.0–52.0)
Hemoglobin: 11.8 g/dL — ABNORMAL LOW (ref 13.0–17.0)
MCH: 29.8 pg (ref 26.0–34.0)
MCHC: 32.3 g/dL (ref 30.0–36.0)
MCV: 92.2 fL (ref 78.0–100.0)
PLATELETS: 109 10*3/uL — AB (ref 150–400)
RBC: 3.96 MIL/uL — ABNORMAL LOW (ref 4.22–5.81)
RDW: 14.9 % (ref 11.5–15.5)
WBC: 13.5 10*3/uL — ABNORMAL HIGH (ref 4.0–10.5)

## 2014-11-13 LAB — TROPONIN I
TROPONIN I: 0.05 ng/mL — AB (ref <0.031)
Troponin I: 0.04 ng/mL — ABNORMAL HIGH (ref <0.031)
Troponin I: 0.05 ng/mL — ABNORMAL HIGH (ref <0.031)

## 2014-11-13 MED ORDER — METOPROLOL TARTRATE 1 MG/ML IV SOLN
5.0000 mg | Freq: Once | INTRAVENOUS | Status: AC
  Administered 2014-11-13: 5 mg via INTRAVENOUS
  Filled 2014-11-13: qty 5

## 2014-11-13 MED ORDER — METOPROLOL TARTRATE 25 MG PO TABS
37.5000 mg | ORAL_TABLET | Freq: Four times a day (QID) | ORAL | Status: DC
  Administered 2014-11-13 – 2014-11-14 (×5): 37.5 mg via ORAL
  Filled 2014-11-13 (×8): qty 1

## 2014-11-13 MED ORDER — SODIUM CHLORIDE 0.9 % IV BOLUS (SEPSIS)
250.0000 mL | Freq: Once | INTRAVENOUS | Status: AC
  Administered 2014-11-13: 250 mL via INTRAVENOUS

## 2014-11-13 NOTE — Progress Notes (Signed)
HR sustained 140's-150's, afib.  Dr. [name] notified.  No new orders given.

## 2014-11-13 NOTE — Progress Notes (Signed)
Saw patient overnight, around 12:00 AM, for tachycardia. Pulse ~150 on exam, SOB w/ minimal movement, intermittent dizziness, and lightheadedness. No chest pain. SpO2 100% on room air. BP 100's/70's. Lungs sounded clear on exam.  -Given Metoprolol 5 mg IV w/ improvement of HR to 110's. BP stable.  -Continue to monitor  Natasha Bence, MD PGY-2, Internal Medicine Pager: [phone]

## 2014-11-13 NOTE — Progress Notes (Addendum)
Subjective:  This morning, he expressed concern that his fiance was currently in the emergency department. He also reported not having a bowel movement even with magnesium citrate he received on Friday. Overall, he still continues to complain of weakness and headache associated with coughing.  Objective: Vital signs in last 24 hours: Filed Vitals:   11/13/14 0500 11/13/14 0600 11/13/14 0630 11/13/14 0732  BP: 107/66 114/68 92/75   Pulse: 103 145 158   Temp:    98.2 F (36.8 C)  TempSrc:    Oral  Resp: 21 26 23    Height:      Weight:      SpO2: 100% 100% 99%    Weight change:   Intake/Output Summary (Last 24 hours) at 11/13/14 0758 Last data filed at 11/13/14 0733  Gross per 24 hour  Intake   1690 ml  Output   1475 ml  Net    215 ml   General: thin elderly African American male, irritable, resting in bed, NAD HEENT: PERRL, EOMI, no scleral icterus Cardiac: irregular rate, no rubs, murmurs or gallops Pulm: wheezing in upper lung fields Abd: soft, nontender, mildly distended, BS present Back: Left CVA tenderness Ext: warm and well perfused, no pedal edema Neuro: responds to questions appropriately; moving all extremities freely   Lab Results: Basic Metabolic Panel:  Recent Labs Lab 11/12/14 1202 11/13/14 0628  NA 135 136  K 5.6* 4.3  CL 102 103  CO2 25 25  GLUCOSE 108* 105*  BUN 56* 45*  CREATININE 2.08* 1.95*  CALCIUM 8.2* 7.9*   Cardiac Enzymes:  Recent Labs Lab 11/12/14 1937 11/13/14 0008 11/13/14 0628  TROPONINI 0.03 0.04* 0.05*   Coagulation:  Recent Labs Lab 11/09/14 1628  INR 1.9   Studies/Results: No results found. Medications: I have reviewed the patient's current medications. Scheduled Meds: . apixaban  5 mg Oral BID  . aztreonam  1 g Intravenous 3 times per day  . buPROPion  75 mg Oral BID  . digoxin  0.0625 mg Oral Daily  . feeding supplement (ENSURE ENLIVE)  237 mL Oral BID BM  . pantoprazole  40 mg Oral Daily  .  rosuvastatin  20 mg Oral q1800  . tiotropium  18 mcg Inhalation Daily   Continuous Infusions: . sodium chloride 100 mL/hr at 11/12/14 1500   PRN Meds:.acetaminophen, ipratropium-albuterol, [DISCONTINUED] oxyCODONE-acetaminophen **AND** oxyCODONE, ramelteon, senna-docusate Assessment/Plan:  E.coli urosepsis: Urine cultures 6/24 notable for E.coli along with 1/2 blood cultures sensitive to multiple antibiotics. Systolic BP overnight mostly 90s-100s but no fever. Leukocytosis improved today to 13.5, down from 20.2 yesterday. Left CVA tenderness noted suggestive of renal involvement. -Continue Aztreonam for now given prior PCN rash [Abx Day 3] -Give IV fluid boluses as tolerated for MAP > 65 -Check renal US  Elevated troponins: Troponins trending 0.03, 0.04, 0.05, improved from 0.21 yesterday. Likely in the setting of uropsesis. -Continue monitoring  Mild thrombocytopenia: Platelets 109 today, mostly stable from 118 on 6/24 though down from 311 on 6/8. Possibly related to urosepsis is no new medications appear to have been added following his recent hospitalization aside from Toprol-XL. No exposure to heparin products noted as well. -Follow CBC  Constipation: Likely from his opioids. He has not had a bowel movement even with magnesium citrate which she received 2 days ago and his home Senokot-S. -Give soapsuds enema today and reassess  Hyperkalemia: Likely in the setting of AKI along with effect of metoprolol. Potassium 4.3 this AM after he received Kayexelate  yesterday.  -Recheck BMET tomorrow morning  COPD: Home medications include albuterol inhaler 2 puffs as needed.   -Continue Duonebs and albuterol nebulizer as needed for wheezing  Chronic atrial fibrillation with RVR: Heart rate since yesterday mostly 130-160. Per conversations with Dr. Rayann Heman, he has tolerated heart rates as high as 140s and 150s for quite some time and has known him for several years as an outpatient. CHADSVASC score  4 which is consistent with a 4.8 % stroke/TIA/embolism. HAS-BLED score 4 which is consistent with 8.7 bleeds per 100 patient-years. Home medications include Coumadin 7.5 mg for INR goal 2-3, Toprol-XL 200 mg, digoxin 0.0625 mg.  -Continue Toprol-XL 150mg  twice daily -Continue digoxin -Continue Eliquis per pharmacy -Monitor on telemetry -Cardiology following, appreciate recommendations  AOCKD Stage 3: Crt 2.0 this AM, improved from 2.1 yesterday with baseline 1.6-1.9. Digoxin level low in the setting of altered renal function. -Repeat BMET -IV fluids as noted above  Chronic systolic CHF: Echo in May 2016 notable for EF 30-35% with diffuse hypokinesis. S/p pacemaker placement. -Toprol and digoxin as noted above  GERD: Home medications include Prilosec 40 mg daily. -Continue medications  Hyperlipidemia: Home medications include rosuvastatin 20 mg. -Continue home medications  Depression: Her medications include Wellbutrin 75 mg twice daily. -Continue home medications  HTN: BP trending mostly 90s-100s/70s-80s. -Toprol as noted above  Chronic pain: He reports taking Percocet 10/325 mg 4-5 times a day for the pain following his hip procedure. -Continue home medications  Severe malnutrition: At his most recent hospitalization, he was noted at BMI 19.2 with reported 24 pound weight loss over the last 5 months. -Give Ensure twice daily between meals  #FEN:  -Diet: Regular  #DVT prophylaxis: Eliquis per pharmacy  #CODE STATUS: FULL CODE -Defer to [place] and daughter Davy Pique [phone] if patients lacks decision-making capacity -Confirmed with patient on admission    Dispo: Disposition is deferred at this time, awaiting improvement of current medical problems.    The patient does have a current PCP (Aldine Contes, MD) and does need an [facility] hospital follow-up appointment after discharge.  The patient does not know have transportation limitations that hinder  transportation to clinic appointments.  .Services Needed at time of discharge: Y = Yes, Blank = No PT:   OT:   RN:   Equipment:   Other:     LOS: 4 days   Riccardo Dubin, MD 11/13/2014, 7:58 AM

## 2014-11-13 NOTE — Progress Notes (Signed)
PT Cancellation Note  Patient Details Name: [name] MRN: [id number] DOB: 02-26-1946   Cancelled Treatment:    Reason Eval/Treat Not Completed: Medical issues which prohibited therapy.  Patient with HR at 160 at rest in bed.  Will hold PT today and return tomorrow for PT evaluation if appropriate for patient.   [name] 11/13/2014, 5:55 PM [name]. [name], [place] Pager [phone]

## 2014-11-13 NOTE — Progress Notes (Signed)
Patient ID: [name], male   DOB: [birth year], 69 y.o.   MRN: [id number]     Subjective:    Generalized fatigue, no palpitations.   Objective:   Temp:  [97.5 F (36.4 C)-99.9 F (37.7 C)] 98.2 F (36.8 C) (06/26 0732) Pulse Rate:  [43-161] 150 (06/26 0900) Resp:  [11-29] 11 (06/26 0900) BP: (72-117)/(40-92) 105/78 mmHg (06/26 0900) SpO2:  [93 %-100 %] 96 % (06/26 0900) Weight:  [156 lb 12 oz (71.1 kg)] 156 lb 12 oz (71.1 kg) (06/26 0413) Last BM Date: 11/09/14  Filed Weights   11/10/14 0550 11/11/14 0527 11/13/14 0413  Weight: 145 lb 8.1 oz (66 kg) 145 lb 11.2 oz (66.089 kg) 156 lb 12 oz (71.1 kg)    Intake/Output Summary (Last 24 hours) at 11/13/14 1127 Last data filed at 11/13/14 1000  Gross per 24 hour  Intake   2110 ml  Output   1475 ml  Net    635 ml    Telemetry: afib 150-170s  Exam:  General: NAD  Resp: clear anteriorally  Cardiac: irreg, tachycardia, no m/r/g  GI: abdomen soft, NT, ND  MSK: no LE edema  Neuro: no focal deficits   Lab Results:  Basic Metabolic Panel:  Recent Labs Lab 11/12/14 0436 11/12/14 1202 11/13/14 0628  NA 136 135 136  K 6.2* 5.6* 4.3  CL 100* 102 103  CO2 27 25 25   GLUCOSE 116* 108* 105*  BUN 52* 56* 45*  CREATININE 2.22* 2.08* 1.95*  CALCIUM 8.7* 8.2* 7.9*    Liver Function Tests: No results for input(s): AST, ALT, ALKPHOS, BILITOT, PROT, ALBUMIN in the last 168 hours.  CBC:  Recent Labs Lab 11/11/14 0438 11/12/14 0436 11/13/14 0800  WBC 20.0* 20.2* 13.5*  HGB 12.8* 12.7* 11.8*  HCT 38.7* 38.7* 36.5*  MCV 91.9 92.1 92.2  PLT 118* 110* 109*    Cardiac Enzymes:  Recent Labs Lab 11/12/14 1937 11/13/14 0008 11/13/14 0628  TROPONINI 0.03 0.04* 0.05*    BNP:  Recent Labs  12/10/13 1810  PROBNP 6010.0*    Coagulation:  Recent Labs Lab 11/09/14 1628  INR 1.9    ECG:   Medications:   Scheduled Medications: . apixaban  5 mg Oral BID  . aztreonam  1 g Intravenous 3 times per  day  . buPROPion  75 mg Oral BID  . digoxin  0.0625 mg Oral Daily  . feeding supplement (ENSURE ENLIVE)  237 mL Oral BID BM  . pantoprazole  40 mg Oral Daily  . rosuvastatin  20 mg Oral q1800  . tiotropium  18 mcg Inhalation Daily     Infusions: . sodium chloride 100 mL/hr at 11/13/14 0900     PRN Medications:  acetaminophen, ipratropium-albuterol, [DISCONTINUED] oxyCODONE-acetaminophen **AND** oxyCODONE, ramelteon, senna-docusate     Assessment/Plan    1. Chronic afib - eliquis for stroke prevention, on  - has failed AAD therapy with Amiodarone (thyroidtoxicosis), Multaq, and Tikosyn (hyperkalemia).  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 - high rates overnight to 150s, improved with IV metoprolol - dilt 180mg  held yesterday due to low bp's, no longer on MAR. Toprol XL also held yesterday due to bp's, no longer on MAR. Likely reason rates significantly jumped yesterday.  - will start short acting metoprolol to allow more room hemodynamically for titration or holding of doses given  hemodyanamic lability given sepsis. Start lopressor 37.5mg  po q6 hrs with fairly lenient hold parameters.   2. Chronic systolic HF - 10/8113 LVEF 30-35%, moderate RV dysfunction - medical therapy limited by soft bp's  3. GNR sepsis - management per primary team   Carlyle Dolly, M.D.

## 2014-11-13 NOTE — Progress Notes (Signed)
  Date: 11/13/2014  Patient name: [name]  Medical record number: [id number]  Date of birth: Aug 27, 1945   This patient's plan of care was discussed with the house staff. Please see Dr. Serita Grit note for complete details. I concur with his findings.   Sid Falcon, MD 11/13/2014, 8:26 PM

## 2014-11-14 ENCOUNTER — Other Ambulatory Visit: Payer: Self-pay | Admitting: *Deleted

## 2014-11-14 DIAGNOSIS — N390 Urinary tract infection, site not specified: Secondary | ICD-10-CM

## 2014-11-14 DIAGNOSIS — A419 Sepsis, unspecified organism: Secondary | ICD-10-CM

## 2014-11-14 LAB — BASIC METABOLIC PANEL
Anion gap: 4 — ABNORMAL LOW (ref 5–15)
BUN: 39 mg/dL — ABNORMAL HIGH (ref 6–20)
CALCIUM: 8.3 mg/dL — AB (ref 8.9–10.3)
CO2: 24 mmol/L (ref 22–32)
Chloride: 111 mmol/L (ref 101–111)
Creatinine, Ser: 1.84 mg/dL — ABNORMAL HIGH (ref 0.61–1.24)
GFR calc Af Amer: 41 mL/min — ABNORMAL LOW (ref >60)
GFR calc non Af Amer: 35 mL/min — ABNORMAL LOW (ref >60)
GLUCOSE: 91 mg/dL (ref 65–99)
POTASSIUM: 4.9 mmol/L (ref 3.5–5.1)
Sodium: 139 mmol/L (ref 135–145)

## 2014-11-14 LAB — CBC
HEMATOCRIT: 35.5 % — AB (ref 39.0–52.0)
HEMOGLOBIN: 12 g/dL — AB (ref 13.0–17.0)
MCH: 30.8 pg (ref 26.0–34.0)
MCHC: 33.8 g/dL (ref 30.0–36.0)
MCV: 91 fL (ref 78.0–100.0)
Platelets: 126 10*3/uL — ABNORMAL LOW (ref 150–400)
RBC: 3.9 MIL/uL — ABNORMAL LOW (ref 4.22–5.81)
RDW: 15.2 % (ref 11.5–15.5)
WBC: 13 10*3/uL — AB (ref 4.0–10.5)

## 2014-11-14 LAB — CULTURE, BLOOD (ROUTINE X 2)

## 2014-11-14 LAB — TROPONIN I: TROPONIN I: 0.05 ng/mL — AB (ref <0.031)

## 2014-11-14 MED ORDER — METOPROLOL TARTRATE 25 MG PO TABS
37.5000 mg | ORAL_TABLET | Freq: Four times a day (QID) | ORAL | Status: DC | PRN
  Filled 2014-11-14: qty 1

## 2014-11-14 MED ORDER — METOPROLOL SUCCINATE ER 100 MG PO TB24
100.0000 mg | ORAL_TABLET | Freq: Every day | ORAL | Status: DC
  Administered 2014-11-14: 100 mg via ORAL
  Filled 2014-11-14 (×2): qty 1

## 2014-11-14 NOTE — Progress Notes (Signed)
Patient ID: [name], male   DOB: December 27, 1945, 69 y.o.   MRN: [id number] Medicine attending: I examined this patient this morning together with resident physician Dr. Charlott Rakes and I concur with his evaluation and management plan. He spiked a fever and had an acute rise in his white blood count and fall in his platelet count on June 24. Exam remarkable for scant Candida exudate in the pharynx. Cultures were obtained. Unexpectedly, within 24 hours blood cultures growing gram-negative rods identified as Escherichia coli. He was wearing a condom catheter. No indwelling Foley catheter. Escherichia coli also isolated from the urine. Antibiotics started with aztreonam. He is currently afebrile. White count down from 20,000 to 13,000. Heart rate has decreased to his chronically  elevated baseline. Cardiology has made recommendations on medications. He feels it is safe to discontinue cardiac monitoring as his sepsis comes under control. This is day 3 of a planned course of 10 days of parenteral antibiotics for Escherichia coli sepsis. This diagnosis certainly meets criteria for acute medical care.

## 2014-11-14 NOTE — Progress Notes (Signed)
   SUBJECTIVE: The patient is doing well today.  At this time, he denies chest pain, shortness of breath, or any new concerns.   [name] apixaban  5 mg Oral BID  . aztreonam  1 g Intravenous 3 times per day  . buPROPion  75 mg Oral BID  . digoxin  0.0625 mg Oral Daily  . feeding supplement (ENSURE ENLIVE)  237 mL Oral BID BM  . metoprolol tartrate  37.5 mg Oral Q6H  . pantoprazole  40 mg Oral Daily  . rosuvastatin  20 mg Oral q1800  . tiotropium  18 mcg Inhalation Daily      OBJECTIVE: Physical Exam: Filed Vitals:   11/14/14 0600 11/14/14 0800 11/14/14 0816 11/14/14 1145  BP: 113/72 109/91  121/90  Pulse: 150 122  109  Temp:  99 F (37.2 C)  98.2 F (36.8 C)  TempSrc:  Oral  Oral  Resp: 25 27  26   Height:      Weight:      SpO2: 95% 98% 98% 96%    Intake/Output Summary (Last 24 hours) at 11/14/14 1434 Last data filed at 11/14/14 1228  Gross per 24 hour  Intake   3740 ml  Output   1751 ml  Net   1989 ml    Telemetry reveals afib 130s-150s GEN- The patient is well appearing, alert and oriented x 3 today.   Head- normocephalic, atraumatic Eyes-  Sclera clear, conjunctiva pink Ears- hearing intact Oropharynx- clear Neck- supple, no JVP Lymph- no cervical lymphadenopathy Lungs- Clear to ausculation bilaterally, normal work of breathing Heart- irregular rate and rhythm  GI- soft, NT, ND, + BS Extremities- no clubbing, cyanosis, or edema   LABS: Basic Metabolic Panel:  Recent Labs  11/13/14 0628 11/14/14 0340  NA 136 139  K 4.3 4.9  CL 103 111  CO2 25 24  GLUCOSE 105* 91  BUN 45* 39*  CREATININE 1.95* 1.84*  CALCIUM 7.9* 8.3*   CBC:  Recent Labs  11/13/14 0800 11/14/14 0340  WBC 13.5* 13.0*  HGB 11.8* 12.0*  HCT 36.5* 35.5*  MCV 92.2 91.0  PLT 109* 126*   Cardiac Enzymes:  Recent Labs  11/13/14 0628 11/13/14 1005 11/14/14 0340  TROPONINI 0.05* 0.05* 0.05*    Assessment/Plan: 1. Longstanding persistent atrial fibrillation Mr. Matera has  longstanding persistent atrial fibrillation and has failed AAD therapy with Amiodarone (thyroidtoxicosis), Multaq, and Tikosyn (hyperkalemia). He has multiple medical problems that result in tachycardia. We will have to allow some degree of tachycardia while trying to optimize his underlying health issues. 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  Not sure why diltiazem was discontinued Continue Eliquis for CHADS2VASC of at least 6 Would NOT favor IV diltiazem as this is a chronic and stable problem for Mr Busler.  Could transfer to regular floor bed and even take off of telemetry as his sepsis improves.  2. Chronic systolic heart failure Euvolemic on exam, careful with IVF    Thompson Grayer, MD 11/14/2014 2:34 PM

## 2014-11-14 NOTE — Care Management (Signed)
Important Message  Patient Details  Name: [name] MRN: [id number] Date of Birth: [birth year]   Medicare Important Message Given:  Yes-second notification given    [name] May 11/14/2014, 3:52 PM

## 2014-11-14 NOTE — Evaluation (Addendum)
Physical Therapy Evaluation Patient Details Name: [name] MRN: [id number] DOB: 06-14-1945 Today's Date: 11/14/2014   History of Present Illness  pt presents with Urosepsis, Thrombocytopenia, Hyperkalemia, and COPD.  pt with hx of TIA, STEMI, Pacer, COPD, Bipolar, and Bil THA.    Clinical Impression  Pt generally weak and deconditioned requiring A for mobility.  Pt indicates his son is unable to provide A at home and is in agreement to further rehab prior to returning to home alone.  Pt's HR remained 110's to 140's throughout session.  RN aware and indicate MDs are aware and feel this may be pt's baseline.  Will continue to follow.      Follow Up Recommendations SNF    Equipment Recommendations  None recommended by PT    Recommendations for Other Services       Precautions / Restrictions Precautions Precautions: Fall Restrictions Weight Bearing Restrictions: No      Mobility  Bed Mobility Overal bed mobility: Needs Assistance Bed Mobility: Supine to Sit     Supine to sit: Min guard     General bed mobility comments: pt needs increased time and definite use of bed rail.    Transfers Overall transfer level: Needs assistance Equipment used: 1 person hand held assist Transfers: Sit to/from [facility] Sit to Stand: Min assist Stand pivot transfers: Min assist       General transfer comment: A for power up to standing and for balance.  pt with uncontrolled descent to sitting in recliner.    Ambulation/Gait                Stairs            Wheelchair Mobility    Modified Rankin (Stroke Patients Only)       Balance Overall balance assessment: Needs assistance Sitting-balance support: No upper extremity supported;Feet supported Sitting balance-Leahy Scale: Good     Standing balance support: During functional activity Standing balance-Leahy Scale: Poor                               Pertinent Vitals/Pain Pain  Assessment: No/denies pain    Home Living Family/patient expects to be discharged to:: Skilled nursing facility                      Prior Function Level of Independence: Independent with assistive device(s)         Comments: walker for mobility     Hand Dominance   Dominant Hand: Right    Extremity/Trunk Assessment   Upper Extremity Assessment: Generalized weakness           Lower Extremity Assessment: Generalized weakness         Communication   Communication: No difficulties  Cognition Arousal/Alertness: Awake/alert Behavior During Therapy: WFL for tasks assessed/performed Overall Cognitive Status: Within Functional Limits for tasks assessed                      General Comments      Exercises        Assessment/Plan    PT Assessment Patient needs continued PT services  PT Diagnosis Difficulty walking;Generalized weakness   PT Problem List Decreased strength;Decreased activity tolerance;Decreased balance;Decreased mobility;Decreased knowledge of use of DME  PT Treatment Interventions DME instruction;Gait training;Functional mobility training;Therapeutic activities;Therapeutic exercise;Balance training;Patient/family education   PT Goals (Current goals can be found in the Care Plan section)  Acute Rehab PT Goals Patient Stated Goal: Get my strength back. PT Goal Formulation: With patient Time For Goal Achievement: 11/28/14 Potential to Achieve Goals: Good    Frequency Min 3X/week   Barriers to discharge Decreased caregiver support Son works as a Administrator.    Co-evaluation               End of Session   Activity Tolerance: Patient limited by fatigue Patient left: in chair;with call bell/phone within reach Nurse Communication: Mobility status         Time: 5009-3818 PT Time Calculation (min) (ACUTE ONLY): 23 min   Charges:   PT Evaluation $Initial PT Evaluation Tier I: 1 Procedure PT Treatments $Therapeutic  Activity: 8-22 mins   PT G CodesCatarina Hartshorn, [place] [phone] 11/14/2014, 12:16 PM

## 2014-11-14 NOTE — Progress Notes (Addendum)
Subjective: This morning, he expresses irritability regarding his frequent lab draws. We attempted explained to him given the acuity of his illness, we needed to have monitoring of various lab parameters, but he did not feel that was appropriate. He otherwise denies any urinary symptoms or feeling worse. He was able to have a bowel movement yesterday.  Objective: Vital signs in last 24 hours: Filed Vitals:   11/14/14 0600 11/14/14 0800 11/14/14 0816 11/14/14 1145  BP: 113/72 109/91  121/90  Pulse: 150 122  109  Temp:  99 F (37.2 C)  98.2 F (36.8 C)  TempSrc:  Oral  Oral  Resp: 25 27  26   Height:      Weight:      SpO2: 95% 98% 98% 96%   Weight change: 3 lb 7.3 oz (1.566 kg)  Intake/Output Summary (Last 24 hours) at 11/14/14 1605 Last data filed at 11/14/14 1228  Gross per 24 hour  Intake   3740 ml  Output   1201 ml  Net   2539 ml   General: thin elderly African American male, irritable, resting in bed, NAD HEENT: PERRL, EOMI, no scleral icterus Cardiac: irregular rate, no rubs, murmurs or gallops Pulm: wheezing in upper lung fields Abd: soft, nontender, mildly distended, BS present Ext: warm and well perfused, no pedal edema Neuro: responds to questions appropriately; moving all extremities freely  Lab Results: Basic Metabolic Panel:  Recent Labs Lab 11/13/14 0628 11/14/14 0340  NA 136 139  K 4.3 4.9  CL 103 111  CO2 25 24  GLUCOSE 105* 91  BUN 45* 39*  CREATININE 1.95* 1.84*  CALCIUM 7.9* 8.3*   Cardiac Enzymes:  Recent Labs Lab 11/13/14 0628 11/13/14 1005 11/14/14 0340  TROPONINI 0.05* 0.05* 0.05*   Coagulation:  Recent Labs Lab 11/09/14 1628  INR 1.9   Studies/Results: US Renal  11/13/2014   CLINICAL DATA:  Acute renal injury  EXAM: RENAL / URINARY TRACT ULTRASOUND COMPLETE  COMPARISON:  10/13/2014  FINDINGS: Right Kidney:  Length: 10.4 cm. Echogenicity within normal limits. No mass or hydronephrosis visualized.  Left Kidney:  Length: 10.8  cm. Echogenicity within normal limits. No mass or hydronephrosis visualized.  Bladder:  Mildly decompressed.  No filling defect is noted.  IMPRESSION: No acute abnormality noted.   Electronically Signed   By: Inez Catalina M.D.   On: 11/13/2014 17:04   Medications: I have reviewed the patient's current medications. Scheduled Meds: . apixaban  5 mg Oral BID  . aztreonam  1 g Intravenous 3 times per day  . buPROPion  75 mg Oral BID  . digoxin  0.0625 mg Oral Daily  . feeding supplement (ENSURE ENLIVE)  237 mL Oral BID BM  . metoprolol succinate  100 mg Oral Daily  . pantoprazole  40 mg Oral Daily  . rosuvastatin  20 mg Oral q1800  . tiotropium  18 mcg Inhalation Daily   Continuous Infusions:   PRN Meds:.acetaminophen, ipratropium-albuterol, metoprolol tartrate, [DISCONTINUED] oxyCODONE-acetaminophen **AND** oxyCODONE, ramelteon, senna-docusate Assessment/Plan:  E.coli urosepsis: Urine cultures 6/24 notable for E.coli along with 1/2 blood cultures sensitive to multiple antibiotics. Systolic BP overnight mostly 100s but no fever. Leukocytosis 13.0, stable from 13.5 yesterday. Renal ultrasound reassuring despite his left CVA tenderness noted on exam yesterday. -Continue Aztreonam for now given prior PCN rash which precludes cephalosporins and cardiac comorbidities which precludes fluoroquinolones [Abx Day 4] -Give IV fluid boluses as tolerated for MAP > 65 -Continue monitoring and stepdown given the severity of his  illness as gram-negative sepsis can lead to clinical deterioration, especially in an individual with several comorbidities  Elevated troponins: Troponins stable at 0.053, and. likely in the setting of uropsesis. -Continue monitoring  Mild thrombocytopenia: Platelets 126 today, mildly improved from 109 on 6/24 though down from 311 on 6/8. Possibly related to urosepsis as no new medications appear to have been added following his recent hospitalization aside from Toprol-XL. No exposure  to heparin products noted as well. -Follow CBC  Hyperkalemia: Likely in the setting of AKI along with effect of metoprolol. Potassium 4.3 this AM after he received Kayexelate yesterday.  -Recheck BMET tomorrow morning  COPD: Home medications include albuterol inhaler 2 puffs as needed.   -Continue Duonebs and albuterol nebulizer as needed for wheezing  Chronic atrial fibrillation with RVR: Heart rate since yesterday mostly 130-160. Per conversations with Dr. Rayann Heman, he has tolerated heart rates as high as 140s and 150s for quite some time and has known him for several years as an outpatient. CHADSVASC score 4 which is consistent with a 4.8 % stroke/TIA/embolism. HAS-BLED score 4 which is consistent with 8.7 bleeds per 100 patient-years. Home medications include Coumadin 7.5 mg for INR goal 2-3, Toprol-XL 200 mg, digoxin 0.0625 mg.  -Continue Toprol-XL 100mg  daily and Lopressor 37.5 mg every 6 hours as needed for symptomatic RVR -Continue digoxin -Continue Eliquis per pharmacy -Monitor on telemetry -Cardiology following, appreciate recommendations  AOCKD Stage 3: Resolved. Crt 1.8 this AM, improved from 2.0 yesterday with baseline 1.6-1.9.  -Repeat BMET  Chronic systolic CHF: Echo in May 2016 notable for EF 30-35% with diffuse hypokinesis. S/p pacemaker placement. -Toprol and digoxin as noted above  GERD: Home medications include Prilosec 40 mg daily. -Continue medications  Hyperlipidemia: Home medications include rosuvastatin 20 mg. -Continue home medications  Depression: Her medications include Wellbutrin 75 mg twice daily. -Continue home medications  HTN: BP trending mostly 100 to 120s/70s-90s. -Toprol as noted above  Chronic pain: He reports taking Percocet 10/325 mg 4-5 times a day for the pain following his hip procedure along with Senokot S 1 tablet as needed for constipation. -Continue home medications  Severe malnutrition: At his most recent hospitalization, he was noted  at BMI 19.2 with reported 24 pound weight loss over the last 5 months. -Give Ensure twice daily between meals  #FEN:  -Diet: Regular  #DVT prophylaxis: Eliquis per pharmacy  #CODE STATUS: FULL CODE -Defer to [place] and daughter Davy Pique [phone] if patients lacks decision-making capacity -Confirmed with patient on admission    Dispo: Disposition is deferred at this time, awaiting improvement of current medical problems.    The patient does have a current PCP (Aldine Contes, MD) and does need an [facility] hospital follow-up appointment after discharge.  The patient does not know have transportation limitations that hinder transportation to clinic appointments.  .Services Needed at time of discharge: Y = Yes, Blank = No PT:   OT:   RN:   Equipment:   Other:     LOS: 5 days   Riccardo Dubin, MD 11/14/2014, 4:05 PM

## 2014-11-14 NOTE — Patient Outreach (Signed)
[facility]) Care Management  11/14/2014  [name] 1945-09-29 [id number]  CSW made a third attempt to try and contact patient today to perform phone assessment, as well as assess and assist with social work needs and services; however, patient was unavailable at the time of CSW's call.  A third HIPAA compliant message was left for patient on voicemail.  CSW is aware that CSW colleague, Elliot Gurney also tried calling patient in CSW's absence, without success.  [name] was able to speak with patient's daughter, [name], who reported that patient had been readmitted into the hospital from home on June 22nd, after having been released from [name] where patient was residing to receive rehabilitative services, on June 17th.   After thorough review of patient's EMR (Electronic Medical Record) in [facility], CSW learned that patient was readmitted into [facility] on June 22nd due to cardic issues.  At present, it was noted that patient's heart rate has decreased to patient's chronicallyelevated baseline. Cardiology has made recommendations with regards to patient's medication regimen and Dr. Charlott Rakes feels that it is safe to discontinue cardiac monitoring as patient's sepsis comes under control  Patient is on day three of a planned 10 day course of parenteral antibiotics for Escherichia Coli Sepsis. CSW will continue to follow patient while hospitalized to assess and assist with discharge planning needs and services.  [name], BSW, MSW, [place] [facility] [place], [place], [place] [id number] [name] [phone]

## 2014-11-15 LAB — CBC
HCT: 35.3 % — ABNORMAL LOW (ref 39.0–52.0)
Hemoglobin: 11.6 g/dL — ABNORMAL LOW (ref 13.0–17.0)
MCH: 30.3 pg (ref 26.0–34.0)
MCHC: 32.9 g/dL (ref 30.0–36.0)
MCV: 92.2 fL (ref 78.0–100.0)
PLATELETS: 135 10*3/uL — AB (ref 150–400)
RBC: 3.83 MIL/uL — ABNORMAL LOW (ref 4.22–5.81)
RDW: 15.3 % (ref 11.5–15.5)
WBC: 10.2 10*3/uL (ref 4.0–10.5)

## 2014-11-15 LAB — BASIC METABOLIC PANEL
Anion gap: 7 (ref 5–15)
BUN: 33 mg/dL — AB (ref 6–20)
CALCIUM: 7.9 mg/dL — AB (ref 8.9–10.3)
CO2: 20 mmol/L — ABNORMAL LOW (ref 22–32)
CREATININE: 1.58 mg/dL — AB (ref 0.61–1.24)
Chloride: 111 mmol/L (ref 101–111)
GFR, EST AFRICAN AMERICAN: 49 mL/min — AB (ref >60)
GFR, EST NON AFRICAN AMERICAN: 43 mL/min — AB (ref >60)
Glucose, Bld: 106 mg/dL — ABNORMAL HIGH (ref 65–99)
POTASSIUM: 4.6 mmol/L (ref 3.5–5.1)
Sodium: 138 mmol/L (ref 135–145)

## 2014-11-15 MED ORDER — DILTIAZEM HCL ER COATED BEADS 120 MG PO CP24
120.0000 mg | ORAL_CAPSULE | Freq: Every day | ORAL | Status: DC
  Administered 2014-11-15 – 2014-11-17 (×3): 120 mg via ORAL
  Filled 2014-11-15 (×3): qty 1

## 2014-11-15 MED ORDER — CIPROFLOXACIN IN D5W 400 MG/200ML IV SOLN
400.0000 mg | Freq: Two times a day (BID) | INTRAVENOUS | Status: DC
  Administered 2014-11-15 – 2014-11-16 (×4): 400 mg via INTRAVENOUS
  Filled 2014-11-15 (×6): qty 200

## 2014-11-15 MED ORDER — METOPROLOL SUCCINATE ER 100 MG PO TB24
200.0000 mg | ORAL_TABLET | Freq: Every day | ORAL | Status: DC
  Administered 2014-11-15 – 2014-11-17 (×3): 200 mg via ORAL
  Filled 2014-11-15 (×3): qty 2

## 2014-11-15 NOTE — Clinical Social Work Note (Signed)
Clinical Social Work Assessment  Patient Details  Name: [name] MRN: [id number] Date of Birth: 1946-03-02  Date of referral:  11/15/14               Reason for consult:  Facility Placement                Housing/Transportation Living arrangements for the past 2 months:  Single Family Home Source of Information:  Patient Patient Interpreter Needed:  None Criminal Activity/Legal Involvement Pertinent to Current Situation/Hospitalization:  No - Comment as needed Significant Relationships:  Adult Children Lives with:  Adult Children Do you feel safe going back to the place where you live?  No Need for family participation in patient care:  No (Coment)  Care giving concerns:  N/A   Facilities manager / plan:  CSW met the pt at bedside. CSW introduced self and purpose of the visit. CSW discussed SNF rehab. Pt reported that he has been to [facility] twice, but does not want to go back there. CSW explained the SNF process. CSW explained insurance and its relation to SNF placement. CSW provided the pt with a SNF list. CSW and pt discussed geographic location in which the he would like to receive rehab. The pt reported that he will like to remain in [facility]. CSW answered all questions in which the pt inquired about. CSW will continue to follow this pt and assist with discharge as needed.   Employment status:  Retired Forensic scientist:  Managed Medicare ([place] ) PT Recommendations:  [place] / Referral to community resources:  [place]  Patient/Family's Response to care: Pt reported that the care in which he has received has been great.   Patient/Family's Understanding of and Emotional Response to Diagnosis, Current Treatment, and Prognosis:  Pt acknowledged that he has experienced some challenges with his health within the last 3 weeks. Pt explained his hospital readmissions. Pt reported that he would like to  return home.   Emotional Assessment Appearance:  Appears stated age Attitude/Demeanor/Rapport:   (Calm ) Affect (typically observed):  Appropriate, Accepting Orientation:  Oriented to Situation, Oriented to  Time, Oriented to Place, Oriented to Self Alcohol / Substance use:  Not Applicable Psych involvement (Current and /or in the community):  No (Comment)  Discharge Needs  Concerns to be addressed:  Denies Needs/Concerns at this time Readmission within the last 30 days:  Yes Current discharge risk:  None Barriers to Discharge:  No Barriers Identified   Travis Mcconathy, LCSW 11/15/2014, 2:22 PM

## 2014-11-15 NOTE — Progress Notes (Signed)
ANTIBIOTIC CONSULT NOTE - FOLLOW UP  Pharmacy Consult for Cipro Indication: urosepsis, E.Coli bacteremia  Allergies  Allergen Reactions  . Penicillins Rash    Patient Measurements: Height: 6\' 3"  (190.5 cm) Weight: 161 lb 8 oz (73.256 kg) IBW/kg (Calculated) : 84.5  Vital Signs: Temp: 98.6 F (37 C) (06/28 0318) Temp Source: Oral (06/28 0318) BP: 109/68 mmHg (06/28 0318) Pulse Rate: 63 (06/28 0318) Intake/Output from previous day: 06/27 0701 - 06/28 0700 In: 1360 [P.O.:600; I.V.:300; IV Piggyback:100] Out: 1850 [Urine:1850] Intake/Output from this shift: Total I/O In: 290 [P.O.:240; IV Piggyback:50] Out: 850 [Urine:850]  Labs:  Recent Labs  11/13/14 0628 11/13/14 0800 11/14/14 0340 11/15/14 0348  WBC  --  13.5* 13.0* 10.2  HGB  --  11.8* 12.0* 11.6*  PLT  --  109* 126* 135*  CREATININE 1.95*  --  1.84* 1.58*   Estimated Creatinine Clearance: 45.1 mL/min (by C-G formula based on Cr of 1.58).   Microbiology: Recent Results (from the past 720 hour(s))  Culture, blood (routine x 2)     Status: None   Collection Time: 11/11/14 12:55 PM  Result Value Ref Range Status   Specimen Description BLOOD RIGHT FOREARM  Final   Special Requests BOTTLES DRAWN AEROBIC ONLY 8CC  Final   Culture  Setup Time   Final    GRAM NEGATIVE RODS AEROBIC BOTTLE ONLY CRITICAL RESULT CALLED TO, READ BACK BY AND VERIFIED WITH: TO TTEJINU(RN) BY TCLEVELAND 11/12/14 AT 6:01AM    Culture ESCHERICHIA COLI  Final   Report Status 11/14/2014 FINAL  Final   Organism ID, Bacteria ESCHERICHIA COLI  Final      Susceptibility   Escherichia coli - MIC*    AMPICILLIN <=2 SENSITIVE Sensitive     CEFAZOLIN <=4 SENSITIVE Sensitive     CEFEPIME <=1 SENSITIVE Sensitive     CEFTAZIDIME <=1 SENSITIVE Sensitive     CEFTRIAXONE <=1 SENSITIVE Sensitive     CIPROFLOXACIN <=0.25 SENSITIVE Sensitive     GENTAMICIN <=1 SENSITIVE Sensitive     IMIPENEM <=0.25 SENSITIVE Sensitive     TRIMETH/SULFA <=20  SENSITIVE Sensitive     AMPICILLIN/SULBACTAM <=2 SENSITIVE Sensitive     PIP/TAZO <=4 SENSITIVE Sensitive     * ESCHERICHIA COLI  Culture, Urine     Status: None   Collection Time: 11/11/14  3:18 PM  Result Value Ref Range Status   Specimen Description URINE, CLEAN CATCH  Final   Special Requests Normal  Final   Culture >=100,000 COLONIES/mL ESCHERICHIA COLI  Final   Report Status 11/13/2014 FINAL  Final   Organism ID, Bacteria ESCHERICHIA COLI  Final      Susceptibility   Escherichia coli - MIC*    AMPICILLIN <=2 SENSITIVE Sensitive     CEFAZOLIN <=4 SENSITIVE Sensitive     CEFTRIAXONE <=1 SENSITIVE Sensitive     CIPROFLOXACIN <=0.25 SENSITIVE Sensitive     GENTAMICIN <=1 SENSITIVE Sensitive     IMIPENEM <=0.25 SENSITIVE Sensitive     NITROFURANTOIN <=16 SENSITIVE Sensitive     TRIMETH/SULFA <=20 SENSITIVE Sensitive     AMPICILLIN/SULBACTAM <=2 SENSITIVE Sensitive     PIP/TAZO <=4 SENSITIVE Sensitive     * >=100,000 COLONIES/mL ESCHERICHIA COLI  Culture, blood (routine x 2)     Status: None (Preliminary result)   Collection Time: 11/11/14  7:44 PM  Result Value Ref Range Status   Specimen Description BLOOD LEFT ANTECUBITAL  Final   Special Requests BOTTLES DRAWN AEROBIC ONLY 4CC  Final  Culture NO GROWTH 3 DAYS  Final   Report Status PENDING  Incomplete  MRSA PCR Screening     Status: None   Collection Time: 11/12/14  5:37 PM  Result Value Ref Range Status   MRSA by PCR NEGATIVE NEGATIVE Final    Comment:        The GeneXpert MRSA Assay (FDA approved for NASAL specimens only), is one component of a comprehensive MRSA colonization surveillance program. It is not intended to diagnose MRSA infection nor to guide or monitor treatment for MRSA infections.     Anti-infectives    Start     Dose/Rate Route Frequency Ordered Stop   11/13/14 1400  levofloxacin (LEVAQUIN) IVPB 750 mg  Status:  Discontinued     750 mg 100 mL/hr over 90 Minutes Intravenous Every 48 hours  11/11/14 1314 11/11/14 1651   11/12/14 1400  vancomycin (VANCOCIN) IVPB 1000 mg/200 mL premix  Status:  Discontinued     1,000 mg 200 mL/hr over 60 Minutes Intravenous Every 24 hours 11/11/14 1314 11/11/14 1643   11/12/14 0900  levofloxacin (LEVAQUIN) IVPB 750 mg  Status:  Discontinued     750 mg 100 mL/hr over 90 Minutes Intravenous Every 48 hours 11/12/14 0825 11/13/14 0012   11/12/14 0900  aztreonam (AZACTAM) 1 g in dextrose 5 % 50 mL IVPB  Status:  Discontinued     1 g 100 mL/hr over 30 Minutes Intravenous 3 times per day 11/12/14 0826 11/15/14 1126   11/11/14 2200  aztreonam (AZACTAM) 1 g in dextrose 5 % 50 mL IVPB  Status:  Discontinued     1 g 100 mL/hr over 30 Minutes Intravenous 3 times per day 11/11/14 1314 11/11/14 1643   11/11/14 1800  levofloxacin (LEVAQUIN) tablet 250 mg  Status:  Discontinued     250 mg Oral Daily-1800 11/11/14 1706 11/12/14 0825   11/11/14 1330  levofloxacin (LEVAQUIN) IVPB 750 mg  Status:  Discontinued     750 mg 100 mL/hr over 90 Minutes Intravenous  Once 11/11/14 1253 11/11/14 1645   11/11/14 1330  aztreonam (AZACTAM) 2 g in dextrose 5 % 50 mL IVPB  Status:  Discontinued     2 g 100 mL/hr over 30 Minutes Intravenous  Once 11/11/14 1253 11/11/14 1644   11/11/14 1330  vancomycin (VANCOCIN) IVPB 1000 mg/200 mL premix  Status:  Discontinued     1,000 mg 200 mL/hr over 60 Minutes Intravenous  Once 11/11/14 1253 11/11/14 1643   11/11/14 1200  fluconazole (DIFLUCAN) tablet 100 mg  Status:  Discontinued     100 mg Oral Daily 11/11/14 1053 11/12/14 1609      Assessment: [name] is a 69 yo M who was on Aztreonam for gram neg UTI/bacteremia.  Cx and sensitivities are complete showing E.coli and antibiotic coverage can be narrowed to Cipro.  His renal function is stable with CrCl ~ 45.  WBC continue to decline.  Pt is afebrile.  Goal of Therapy:  Eradication of Infection  Plan:  Cipro 400mg  IV q12h - noted anticipate 10d abx course (stop date 7/4) Would  recommend changing to Cipro 500mg  PO q12h as patient improves. Rx will sign off.  [employer], Pharm.D., BCPS Clinical Pharmacist Pager [phone] 11/15/2014 11:38 AM

## 2014-11-15 NOTE — Progress Notes (Signed)
Admission note:  Arrival Method: Patient arrived in bed with the staff accompanying from 3S. Mental Orientation:  Alert and oriented x 4. Telemetry: N/A Assessment: See doc flow sheets. Skin: Warm, dry and intact, no open areas or any wounds noted.  Assessed with Allyson. IV: Patent, saline lock, left forearm. Pain: 7/10 generalized body aches, given Oxy. Tubes: N/A Safety Measures:  Bed low in position, phone and call light within reach.  Fall Prevention Safety Plan: Reviewed the plan, understood and acknowledged. Admission Screening: Completed 6700 Orientation: Patient has been oriented to the unit, staff and to the room.

## 2014-11-15 NOTE — Progress Notes (Signed)
SUBJECTIVE: The patient is doing well today.  At this time, he denies chest pain, shortness of breath, or any new concerns.   CURRENT MEDICATIONS: . apixaban  5 mg Oral BID  . aztreonam  1 g Intravenous 3 times per day  . buPROPion  75 mg Oral BID  . digoxin  0.0625 mg Oral Daily  . feeding supplement (ENSURE ENLIVE)  237 mL Oral BID BM  . metoprolol succinate  100 mg Oral Daily  . pantoprazole  40 mg Oral Daily  . rosuvastatin  20 mg Oral q1800  . tiotropium  18 mcg Inhalation Daily      OBJECTIVE: Physical Exam: Filed Vitals:   11/14/14 2000 11/15/14 0000 11/15/14 0318 11/15/14 0808  BP: 136/97 117/73 109/68   Pulse: 135 97 63   Temp: 99 F (37.2 C) 98 F (36.7 C) 98.6 F (37 C)   TempSrc: Oral Oral Oral   Resp: 18  12   Height:   6\' 3"  (1.905 m)   Weight:   161 lb 8 oz (73.256 kg)   SpO2: 96% 97% 100% 99%    Intake/Output Summary (Last 24 hours) at 11/15/14 0813 Last data filed at 11/15/14 0323  Gross per 24 hour  Intake   1120 ml  Output   1850 ml  Net   -730 ml    Telemetry reveals afib 130s-150s GEN- The patient is well appearing, alert and oriented x 3 today.   Head- normocephalic, atraumatic Eyes-  Sclera clear, conjunctiva pink Ears- hearing intact Oropharynx- clear Neck- supple, no JVP Lymph- no cervical lymphadenopathy Lungs- Clear to ausculation bilaterally, normal work of breathing Heart- irregular rate and rhythm  GI- soft, NT, ND, + BS Extremities- no clubbing, cyanosis, or edema   LABS: Basic Metabolic Panel:  Recent Labs  11/14/14 0340 11/15/14 0348  NA 139 138  K 4.9 4.6  CL 111 111  CO2 24 20*  GLUCOSE 91 106*  BUN 39* 33*  CREATININE 1.84* 1.58*  CALCIUM 8.3* 7.9*   CBC:  Recent Labs  11/14/14 0340 11/15/14 0348  WBC 13.0* 10.2  HGB 12.0* 11.6*  HCT 35.5* 35.3*  MCV 91.0 92.2  PLT 126* 135*   Cardiac Enzymes:  Recent Labs  11/13/14 0628 11/13/14 1005 11/14/14 0340  TROPONINI 0.05* 0.05* 0.05*     Assessment/Plan: 1. Longstanding persistent atrial fibrillation Mr. Titsworth has longstanding persistent atrial fibrillation and has failed AAD therapy with Amiodarone (thyroidtoxicosis), Multaq, and Tikosyn (hyperkalemia). He has multiple medical problems that result in tachycardia. We will have to allow some degree of tachycardia while trying to optimize his underlying health issues. 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 Increase Metoprolol to 200mg  daily; start Diltiazem CD 120mg  daily (depressed EF is likely tachycardia mediated and per Dr Jackalyn Lombard prior notes, he will ultimately do better on diltiazem than worse) Continue Eliquis for [facility] of at least 6 Would NOT favor IV diltiazem as this is a chronic and stable problem for Mr Drudge.   Could transfer to regular floor bed and even take off of telemetry as his sepsis improves.  2. Chronic systolic heart failure Euvolemic on exam, careful with IVF   Chanetta Marshall, NP 11/15/2014 8:17 AM  I have seen, examined the patient, and reviewed the above assessment and plan.  On exam, comfortable appearing. Tachycardic irregular rhythm.  Changes to  above are made where necessary.   Will increase metoprolol and restart dilt.  We really have very little additional to offer from an arrhythmia standpoint.  Can transfer to gen medicine service once sepsis is treated.  May be better served off of telemetry.  Co Sign: Thompson Grayer, MD 11/15/2014 11:32 AM

## 2014-11-15 NOTE — Progress Notes (Signed)
Subjective: This AM, he denied any complaints. He appreciated [place] trying to limit his lab draws and is agreeable to switching his antibiotics. We explained to him that he'll be transferred out of stepdown onto regular floor.  Objective: Vital signs in last 24 hours: Filed Vitals:   11/14/14 2000 11/15/14 0000 11/15/14 0318 11/15/14 0808  BP: 136/97 117/73 109/68   Pulse: 135 97 63   Temp: 99 F (37.2 C) 98 F (36.7 C) 98.6 F (37 C)   TempSrc: Oral Oral Oral   Resp: 18  12   Height:   6\' 3"  (1.905 m)   Weight:   161 lb 8 oz (73.256 kg)   SpO2: 96% 97% 100% 99%   Weight change: 1 lb 4.8 oz (0.59 kg)  Intake/Output Summary (Last 24 hours) at 11/15/14 1309 Last data filed at 11/15/14 1000  Gross per 24 hour  Intake    390 ml  Output   2700 ml  Net  -2310 ml   General: thin elderly African American male, irritable, resting in bed, NAD HEENT: PERRL, EOMI, no scleral icterus Cardiac: irregular rate, no rubs, murmurs or gallops Pulm: Clear breath sounds bilaterally Abd: soft, nontender, nondistended, BS present Ext: warm and well perfused, no pedal edema Neuro: responds to questions appropriately; moving all extremities freely  Lab Results: Basic Metabolic Panel:  Recent Labs Lab 11/14/14 0340 11/15/14 0348  NA 139 138  K 4.9 4.6  CL 111 111  CO2 24 20*  GLUCOSE 91 106*  BUN 39* 33*  CREATININE 1.84* 1.58*  CALCIUM 8.3* 7.9*   Cardiac Enzymes:  Recent Labs Lab 11/13/14 0628 11/13/14 1005 11/14/14 0340  TROPONINI 0.05* 0.05* 0.05*   Coagulation:  Recent Labs Lab 11/09/14 1628  INR 1.9   Studies/Results: US Renal  11/13/2014   CLINICAL DATA:  Acute renal injury  EXAM: RENAL / URINARY TRACT ULTRASOUND COMPLETE  COMPARISON:  10/13/2014  FINDINGS: Right Kidney:  Length: 10.4 cm. Echogenicity within normal limits. No mass or hydronephrosis visualized.  Left Kidney:  Length: 10.8 cm. Echogenicity within normal limits. No mass or hydronephrosis visualized.   Bladder:  Mildly decompressed.  No filling defect is noted.  IMPRESSION: No acute abnormality noted.   Electronically Signed   By: Inez Catalina M.D.   On: 11/13/2014 17:04   Medications: I have reviewed the patient's current medications. Scheduled Meds: . apixaban  5 mg Oral BID  . buPROPion  75 mg Oral BID  . ciprofloxacin  400 mg Intravenous Q12H  . digoxin  0.0625 mg Oral Daily  . diltiazem  120 mg Oral Daily  . feeding supplement (ENSURE ENLIVE)  237 mL Oral BID BM  . metoprolol succinate  200 mg Oral Daily  . pantoprazole  40 mg Oral Daily  . rosuvastatin  20 mg Oral q1800  . tiotropium  18 mcg Inhalation Daily   Continuous Infusions:   PRN Meds:.acetaminophen, ipratropium-albuterol, [DISCONTINUED] oxyCODONE-acetaminophen **AND** oxyCODONE, ramelteon, senna-docusate Assessment/Plan:  E.coli urosepsis: Urine cultures 6/24 notable for E.coli along with 1/2 blood cultures sensitive to multiple antibiotics. Systolic BP overnight mostly 100s but no fever. Leukocytosis 10.2, improved from 13 yesterday.  -Transition Aztreonam to ciprofloxacin IV given prior PCN rash [Abx Day 5] -Transfer to MedSurg floor today off telemetry  Mild thrombocytopenia: Platelets 135, improved from 126 yesterday, though down from 311 on 6/8. Likely related to urosepsis as no new medications appear to have been added following his recent hospitalization aside from Toprol-XL. No exposure to heparin  products noted as well. -Follow CBC  COPD: Home medications include albuterol inhaler 2 puffs as needed.   -Continue Duonebs and albuterol nebulizer as needed for wheezing  Chronic atrial fibrillation with RVR: Heart rate since yesterday mostly 130-160. Per conversations with Dr. Rayann Heman, he has tolerated heart rates as high as 140s and 150s for quite some time and has known him for several years as an outpatient. CHADSVASC score 4 which is consistent with a 4.8 % stroke/TIA/embolism. HAS-BLED score 4 which is  consistent with 8.7 bleeds per 100 patient-years. Home medications include Coumadin 7.5 mg for INR goal 2-3, Toprol-XL 200 mg, digoxin 0.0625 mg.  -Continue Toprol-XL 200mg  & Cardizem CD 120mg   -Continue digoxin -Continue Eliquis per pharmacy -Monitor on telemetry -Cardiology following, appreciate recommendations  Chronic systolic CHF: Echo in May 2016 notable for EF 30-35% with diffuse hypokinesis. S/p pacemaker placement. -Toprol and digoxin as noted above  GERD: Home medications include Prilosec 40 mg daily. -Continue medications  Hyperlipidemia: Home medications include rosuvastatin 20 mg. -Continue home medications  Depression: Her medications include Wellbutrin 75 mg twice daily. -Continue home medications  HTN: BP trending mostly 100 to 140s/70s-90s. -Toprol, Cardizem as noted above  Chronic pain: He reports taking Percocet 10/325 mg 4-5 times a day for the pain following his hip procedure along with Senokot S 1 tablet as needed for constipation. -Continue home medications  Severe malnutrition: At his most recent hospitalization, he was noted at BMI 19.2 with reported 24 pound weight loss over the last 5 months. -Give Ensure twice daily between meals  #FEN:  -Diet: Regular  #DVT prophylaxis: Eliquis per pharmacy  #CODE STATUS: FULL CODE -Defer to [place] and daughter Davy Pique [phone] if patients lacks decision-making capacity -Confirmed with patient on admission    Dispo: Disposition is deferred at this time, awaiting improvement of current medical problems.    The patient does have a current PCP (Aldine Contes, MD) and does need an [facility] hospital follow-up appointment after discharge.  The patient does not know have transportation limitations that hinder transportation to clinic appointments.  .Services Needed at time of discharge: Y = Yes, Blank = No PT:   OT:   RN:   Equipment:   Other:     LOS: 6 days   Riccardo Dubin,  MD 11/15/2014, 1:09 PM

## 2014-11-15 NOTE — Clinical Social Work Placement (Signed)
   CLINICAL SOCIAL WORK PLACEMENT  NOTE  Date:  11/15/2014  Patient Details  Name: [name] MRN: [id number] Date of Birth: [birth year]  Clinical Social Work is seeking post-discharge placement for this patient at the [place] level of care (*CSW will initial, date and re-position this form in  chart as items are completed):  Yes   Patient/family provided with [place] Work Department's list of facilities offering this level of care within the geographic area requested by the patient (or if unable, by the patient's family).  Yes   Patient/family informed of their freedom to choose among providers that offer the needed level of care, that participate in Medicare, Medicaid or managed care program needed by the patient, have an available bed and are willing to accept the patient.  Yes   Patient/family informed of [place]'s ownership interest in [facility] and [facility], as well as of the fact that they are under no obligation to receive care at these facilities.  PASRR submitted to EDS on       PASRR number received on       Existing PASRR number confirmed on 11/15/14     FL2 transmitted to all facilities in geographic area requested by pt/family on       FL2 transmitted to all facilities within larger geographic area on 11/15/14     Patient informed that his/her managed care company has contracts with or will negotiate with certain facilities, including the following:            Patient/family informed of bed offers received.  Patient chooses bed at       Physician recommends and patient chooses bed at      Patient to be transferred to   on  .  Patient to be transferred to facility by       Patient family notified on   of transfer.  Name of family member notified:        PHYSICIAN       Additional Comment:    _______________________________________________ Greta Doom, LCSW 11/15/2014, 2:33 PM

## 2014-11-15 NOTE — Progress Notes (Signed)
Patient ID: [name], male   DOB: 03-Jul-1945, 69 y.o.   MRN: [id number] Medicine attending: I examined this patient this morning together with resident physician Dr. Charlott Rakes and I concur with his evaluation and management plan. His condition has stabilized. Day #4 of 10 antibiotics for gram-negative/Escherichia coli urosepsis. We will change antibiotics to Cipro. He has a history of penicillin allergy with a rash so I believe that if we needed to we could treat him with a second or third generation Cephalosporin. He remains in atrial fibrillation at a rapid rate but is asymptomatic.

## 2014-11-16 ENCOUNTER — Encounter: Payer: Commercial Managed Care - HMO | Admitting: Internal Medicine

## 2014-11-16 DIAGNOSIS — Z960 Presence of urogenital implants: Secondary | ICD-10-CM

## 2014-11-16 DIAGNOSIS — A4151 Sepsis due to Escherichia coli [E. coli]: Secondary | ICD-10-CM

## 2014-11-16 DIAGNOSIS — B9620 Unspecified Escherichia coli [E. coli] as the cause of diseases classified elsewhere: Secondary | ICD-10-CM

## 2014-11-16 LAB — BASIC METABOLIC PANEL
Anion gap: 8 (ref 5–15)
BUN: 29 mg/dL — ABNORMAL HIGH (ref 6–20)
CHLORIDE: 106 mmol/L (ref 101–111)
CO2: 26 mmol/L (ref 22–32)
CREATININE: 1.63 mg/dL — AB (ref 0.61–1.24)
Calcium: 8.4 mg/dL — ABNORMAL LOW (ref 8.9–10.3)
GFR calc Af Amer: 48 mL/min — ABNORMAL LOW (ref >60)
GFR calc non Af Amer: 41 mL/min — ABNORMAL LOW (ref >60)
Glucose, Bld: 98 mg/dL (ref 65–99)
Potassium: 4.5 mmol/L (ref 3.5–5.1)
SODIUM: 140 mmol/L (ref 135–145)

## 2014-11-16 LAB — CBC
HEMATOCRIT: 37.4 % — AB (ref 39.0–52.0)
HEMOGLOBIN: 12.2 g/dL — AB (ref 13.0–17.0)
MCH: 30 pg (ref 26.0–34.0)
MCHC: 32.6 g/dL (ref 30.0–36.0)
MCV: 91.9 fL (ref 78.0–100.0)
Platelets: 172 10*3/uL (ref 150–400)
RBC: 4.07 MIL/uL — AB (ref 4.22–5.81)
RDW: 15.1 % (ref 11.5–15.5)
WBC: 9.6 10*3/uL (ref 4.0–10.5)

## 2014-11-16 LAB — CULTURE, BLOOD (ROUTINE X 2): CULTURE: NO GROWTH

## 2014-11-16 MED ORDER — SENNOSIDES-DOCUSATE SODIUM 8.6-50 MG PO TABS
1.0000 | ORAL_TABLET | Freq: Two times a day (BID) | ORAL | Status: DC
  Administered 2014-11-16 – 2014-11-17 (×2): 1 via ORAL
  Filled 2014-11-16 (×3): qty 1

## 2014-11-16 MED ORDER — MAGNESIUM CITRATE PO SOLN
1.0000 | Freq: Once | ORAL | Status: DC
  Filled 2014-11-16: qty 296

## 2014-11-16 NOTE — Progress Notes (Signed)
SUBJECTIVE: The patient is doing well today.  At this time, he denies chest pain, shortness of breath, or any new concerns.   CURRENT MEDICATIONS: . apixaban  5 mg Oral BID  . buPROPion  75 mg Oral BID  . ciprofloxacin  400 mg Intravenous Q12H  . digoxin  0.0625 mg Oral Daily  . diltiazem  120 mg Oral Daily  . feeding supplement (ENSURE ENLIVE)  237 mL Oral BID BM  . metoprolol succinate  200 mg Oral Daily  . pantoprazole  40 mg Oral Daily  . rosuvastatin  20 mg Oral q1800  . tiotropium  18 mcg Inhalation Daily      OBJECTIVE: Physical Exam: Filed Vitals:   11/15/14 1200 11/15/14 1618 11/15/14 2151 11/16/14 0532  BP: 109/79 116/69 107/70 108/76  Pulse: 140 97 141 75  Temp:  98.1 F (36.7 C) 99.4 F (37.4 C) 98.1 F (36.7 C)  TempSrc:  Oral Oral Oral  Resp: 29 17 16 18   Height:      Weight:      SpO2: 97% 98% 100% 99%    Intake/Output Summary (Last 24 hours) at 11/16/14 1027 Last data filed at 11/16/14 0533  Gross per 24 hour  Intake    530 ml  Output   3200 ml  Net  -2670 ml   GEN- The patient is well appearing, alert and oriented x 3 today.   Head- normocephalic, atraumatic Eyes-  Sclera clear, conjunctiva pink Ears- hearing intact Oropharynx- clear Neck- supple, no JVP Lymph- no cervical lymphadenopathy Lungs- Clear to ausculation bilaterally, normal work of breathing Heart- irregular rate and rhythm  GI- soft, NT, ND, + BS Extremities- no clubbing, cyanosis, or edema   LABS: Basic Metabolic Panel:  Recent Labs  11/15/14 0348 11/16/14 0555  NA 138 140  K 4.6 4.5  CL 111 106  CO2 20* 26  GLUCOSE 106* 98  BUN 33* 29*  CREATININE 1.58* 1.63*  CALCIUM 7.9* 8.4*   CBC:  Recent Labs  11/15/14 0348 11/16/14 0555  WBC 10.2 9.6  HGB 11.6* 12.2*  HCT 35.3* 37.4*  MCV 92.2 91.9  PLT 135* 172   Cardiac Enzymes:  Recent Labs  11/13/14 1005 11/14/14 0340  TROPONINI 0.05* 0.05*    Assessment/Plan: 1. Longstanding persistent atrial  fibrillation Mr. Rueth has longstanding persistent atrial fibrillation and has failed AAD therapy with Amiodarone (thyroidtoxicosis), Multaq, and Tikosyn (hyperkalemia). He has multiple medical problems that result in tachycardia. We will have to allow some degree of tachycardia while trying to optimize his underlying health issues. 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 Continue medical therapy with Metoprolol to 200mg  daily, Diltiazem CD 120mg  daily (depressed EF is likely tachycardia mediated and per Dr Jackalyn Lombard prior notes, he will ultimately do better on diltiazem than worse) Continue Eliquis for [facility] of at least 6  2. Chronic systolic heart failure Euvolemic on exam   Electrophysiology team to see as needed while here. Please call with questions.   Chanetta Marshall, NP 11/16/2014 8:12 AM  I have seen, examined the patient, and reviewed the above assessment and plan.  On exam, comfortable appearing. Tachycardic irregular rhythm.  Changes to above are made where necessary.   Ok to discharge from a cardiology standpoint when his urosepsis/ GI symptoms are resolved.  I will follow closely in the office (if he will  comply).  Will see as needed while here.  Thompson Grayer MD, [facility] 11/16/2014 8:21 AM

## 2014-11-16 NOTE — Care Management Note (Signed)
Case Management Note  Patient Details  Name: ZACHARIAS RIDLING MRN: [id number] Date of Birth: 1945/09/12  Subjective/Objective:         CM following for progression and d/c planning.           Action/Plan: Plan now is for pt to d/c to SNF, per CSW, [place], pt is in ageeement to d/c to SNF for short term rehab.  Expected Discharge Date:        11/17/2014          Expected Discharge Plan:  [place]  In-House Referral:  Clinical Social Work  Discharge planning Services  CM Consult, [place]  Post Acute Care Choice:    Choice offered to:  NA  DME Arranged:    DME Agency:     HH Arranged:    [place] Agency:     Status of Service:  Completed, signed off  Medicare Important Message Given:  Yes-second notification given Date Medicare IM Given:    Medicare IM give by:    Date Additional Medicare IM Given:    Additional Medicare Important Message give by:     If discussed at [place] of Stay Meetings, dates discussed:    Additional Comments:  Adron Bene, RN 11/16/2014, 4:22 PM

## 2014-11-16 NOTE — Clinical Social Work Placement (Addendum)
   CLINICAL SOCIAL WORK PLACEMENT  NOTE  Date:  11/16/2014  Patient Details  Name: [name] MRN: [id number] Date of Birth: 12/19/1945  Clinical Social Work is seeking post-discharge placement for this patient at the [place] level of care (*CSW will initial, date and re-position this form in  chart as items are completed):  Yes   Patient/family provided with [place] Work Department's list of facilities offering this level of care within the geographic area requested by the patient (or if unable, by the patient's family).  Yes   Patient/family informed of their freedom to choose among providers that offer the needed level of care, that participate in Medicare, Medicaid or managed care program needed by the patient, have an available bed and are willing to accept the patient.  Yes   Patient/family informed of [place]'s ownership interest in [facility] and [facility], as well as of the fact that they are under no obligation to receive care at these facilities.  PASRR submitted to EDS on       PASRR number received on       Existing PASRR number confirmed on 11/15/14     FL2 transmitted to all facilities in geographic area requested by pt/family on       FL2 transmitted to all facilities within larger geographic area on 11/15/14     Patient informed that his/her managed care company has contracts with or will negotiate with certain facilities, including the following:         11/16/14: Patient/family informed of bed offers received.  Patient chooses bed at  [facility]     Physician recommends and patient chooses bed at      Patient to be transferred to   on  .  Patient to be transferred to facility by       Patient family notified on   of transfer.  Name of family member notified:        PHYSICIAN       Additional Comment:  11/17/14 - Received [employer] authorization -  [id number]   _______________________________________________ Sable Feil, LCSW 11/16/2014, 7:11 PM

## 2014-11-16 NOTE — Progress Notes (Signed)
PT Cancellation Note  Patient Details Name: [name] MRN: [id number] DOB: [birth year]   Cancelled Treatment:    Reason Eval/Treat Not Completed: Fatigue/lethargy limiting ability to participate. Pt sleeping when PT arrived. Aroused easily to light touch, and states that he is feeling bad. Declines OOB with therapy despite encouragement to participate. Will continue to follow.    Rolinda Roan 11/16/2014, 2:29 PM   Rolinda Roan, PT, DPT Acute Rehabilitation Services Pager: [phone]

## 2014-11-16 NOTE — Progress Notes (Signed)
Patient ID: [name], male   DOB: Feb 08, 1946, 69 y.o.   MRN: [id number] Medicine attending: I examined this patient today together with resident physician Dr. Charlott Rakes and I concur with his evaluation and management plan. Afebrile and white count normalizing Day #5 of 10 antibiotics for Escherichia coli urosepsis. We will transition to oral antibiotics at time of discharge. Persistent atrial fibrillation at rapid rate with some improvement with addition of Cardizem to his beta blocker and digitoxin. Anticipate transfer to extended care facility when bed available for rehabilitation in view of deconditioning .

## 2014-11-16 NOTE — Consult Note (Signed)
   [facility] Inpatient Consult   11/16/2014  [name] 1946/02/12 [id number]   Noted patient's discharge plan is for SNF. Likely [facility]. Patient is active with [facility]. Will update [facility] Community team. Spoke with both inpatient Licensed CSW and inpatient RNCM about patient's discharge plan.  Marthenia Rolling, MSN-Ed, RN,BSN [facility] Liaison [phone]

## 2014-11-16 NOTE — Progress Notes (Signed)
Subjective: This AM, he reports some abdominal pain he noticed overnight. He feels he hasn't had a bowel movement since he received an enema on Sunday and is willing to try something else again. He feels fine otherwise and appreciates his care. He is interested in SNF placement per his conversations with SW & PT.   Objective: Vital signs in last 24 hours: Filed Vitals:   11/15/14 1200 11/15/14 1618 11/15/14 2151 11/16/14 0532  BP: 109/79 116/69 107/70 108/76  Pulse: 140 97 141 75  Temp:  98.1 F (36.7 C) 99.4 F (37.4 C) 98.1 F (36.7 C)  TempSrc:  Oral Oral Oral  Resp: 29 17 16 18   Height:      Weight:      SpO2: 97% 98% 100% 99%   Weight change:   Intake/Output Summary (Last 24 hours) at 11/16/14 0834 Last data filed at 11/16/14 0533  Gross per 24 hour  Intake    530 ml  Output   3200 ml  Net  -2670 ml   General: thin elderly African American male, resting in bed, NAD  HEENT: PERRL, EOMI, no scleral icterus Cardiac: irregular rate, no rubs, murmurs or gallops Pulm: coarse breath sounds at the bases Abd: soft, mild tenderness subumbilically and bilaterally, nondistended, BS present Ext: warm and well perfused, no pedal edema Neuro: responds to questions appropriately; moving all extremities freely  Lab Results: Basic Metabolic Panel:  Recent Labs Lab 11/15/14 0348 11/16/14 0555  NA 138 140  K 4.6 4.5  CL 111 106  CO2 20* 26  GLUCOSE 106* 98  BUN 33* 29*  CREATININE 1.58* 1.63*  CALCIUM 7.9* 8.4*   Cardiac Enzymes:  Recent Labs Lab 11/13/14 0628 11/13/14 1005 11/14/14 0340  TROPONINI 0.05* 0.05* 0.05*   Coagulation:  Recent Labs Lab 11/09/14 1628  INR 1.9   Studies/Results: No results found. Medications: I have reviewed the patient's current medications. Scheduled Meds: . apixaban  5 mg Oral BID  . buPROPion  75 mg Oral BID  . ciprofloxacin  400 mg Intravenous Q12H  . digoxin  0.0625 mg Oral Daily  . diltiazem  120 mg Oral Daily  .  feeding supplement (ENSURE ENLIVE)  237 mL Oral BID BM  . metoprolol succinate  200 mg Oral Daily  . pantoprazole  40 mg Oral Daily  . rosuvastatin  20 mg Oral q1800  . tiotropium  18 mcg Inhalation Daily   Continuous Infusions:   PRN Meds:.acetaminophen, ipratropium-albuterol, [DISCONTINUED] oxyCODONE-acetaminophen **AND** oxyCODONE, ramelteon, senna-docusate Assessment/Plan:  E.coli sepsis 2/2 UTI: Urine cultures 6/24 notable for E.coli along with 1/2 blood cultures sensitive to multiple antibiotics. Systolic BP overnight mostly 100s but no fever x 4 days. Leukocytosis 9.6, improved from 10.2.  -Continue ciprofloxacin 400mg  IV twice daily given prior PCN rash [Abx Day 6] -Consult PT/OT to help him ambulate and mobilize  Constipation: It appears he is not taking Senokot-S with his chronic pain medications.  -Order Mg citrate.  -Schedule Senokot-S  Mild thrombocytopenia: Resolved. Platelets 135, improved from 126 yesterday, though down from 311 on 6/8. Likely related to sepsis as no new medications appear to have been added following his recent hospitalization aside from Toprol-XL. No exposure to heparin products noted as well. -Follow CBC  COPD: Home medications include albuterol inhaler 2 puffs as needed.   -Continue Duonebs and albuterol nebulizer as needed for wheezing  Chronic atrial fibrillation with RVR: Rate controlled now with HR since yesterday 41, 97, 141. Per conversations with Dr. Rayann Heman,  he has tolerated heart rates as high as 140s and 150s for quite some time and has known him for several years as an outpatient. CHADSVASC score 6  which is consistent with a 9.7 % stroke/TIA/embolism. HAS-BLED score 4 which is consistent with 8.7 bleeds per 100 patient-years. Home medications include Coumadin 7.5 mg for INR goal 2-3, Toprol-XL 200 mg, digoxin 0.0625 mg.  -Continue Toprol-XL 200mg  & Cardizem CD 120mg   -Continue digoxin -Continue Eliquis per pharmacy -Monitor on  telemetry -Cardiology following, appreciate recommendations  Chronic systolic CHF: Echo in May 2016 notable for EF 30-35% with diffuse hypokinesis. S/p pacemaker placement. -Toprol and digoxin as noted above  GERD: Home medications include Prilosec 40 mg daily. -Continue medications  Hyperlipidemia: Home medications include rosuvastatin 20 mg. -Continue home medications  Depression: Her medications include Wellbutrin 75 mg twice daily. -Continue home medications  HTN: BP trending mostly 100 to 140s/70s-90s. -Toprol, Cardizem as noted above  Chronic pain: He reports taking Percocet 10/325 mg 4-5 times a day for the pain following his hip procedure along with Senokot S 1 tablet as needed for constipation. -Continue home Percocet -Senokot as noted above  Severe malnutrition: At his most recent hospitalization, he was noted at BMI 19.2 with reported 24 pound weight loss over the last 5 months. -Give Ensure twice daily between meals  #FEN:  -Diet: Regular  #DVT prophylaxis: Eliquis per pharmacy  #CODE STATUS: FULL CODE -Defer to [place] and daughter Davy Pique [phone] if patients lacks decision-making capacity -Confirmed with patient on admission  Dispo: Pending SNF placement  The patient does have a current PCP (Aldine Contes, MD) and does need an [facility] hospital follow-up appointment after discharge.  The patient does not know have transportation limitations that hinder transportation to clinic appointments.  .Services Needed at time of discharge: Y = Yes, Blank = No PT:   OT:   RN:   Equipment:   Other:     LOS: 7 days   Riccardo Dubin, MD 11/16/2014, 8:34 AM

## 2014-11-17 ENCOUNTER — Other Ambulatory Visit: Payer: Self-pay | Admitting: *Deleted

## 2014-11-17 DIAGNOSIS — R000 Tachycardia, unspecified: Secondary | ICD-10-CM | POA: Diagnosis not present

## 2014-11-17 DIAGNOSIS — F329 Major depressive disorder, single episode, unspecified: Secondary | ICD-10-CM | POA: Diagnosis not present

## 2014-11-17 DIAGNOSIS — N179 Acute kidney failure, unspecified: Secondary | ICD-10-CM | POA: Diagnosis not present

## 2014-11-17 DIAGNOSIS — I739 Peripheral vascular disease, unspecified: Secondary | ICD-10-CM | POA: Diagnosis not present

## 2014-11-17 DIAGNOSIS — N390 Urinary tract infection, site not specified: Secondary | ICD-10-CM | POA: Diagnosis not present

## 2014-11-17 DIAGNOSIS — R1312 Dysphagia, oropharyngeal phase: Secondary | ICD-10-CM | POA: Diagnosis not present

## 2014-11-17 DIAGNOSIS — R2681 Unsteadiness on feet: Secondary | ICD-10-CM | POA: Diagnosis not present

## 2014-11-17 DIAGNOSIS — R0602 Shortness of breath: Secondary | ICD-10-CM | POA: Diagnosis not present

## 2014-11-17 DIAGNOSIS — J449 Chronic obstructive pulmonary disease, unspecified: Secondary | ICD-10-CM | POA: Diagnosis not present

## 2014-11-17 DIAGNOSIS — I5022 Chronic systolic (congestive) heart failure: Secondary | ICD-10-CM | POA: Diagnosis not present

## 2014-11-17 DIAGNOSIS — I482 Chronic atrial fibrillation: Secondary | ICD-10-CM | POA: Diagnosis not present

## 2014-11-17 DIAGNOSIS — Z7901 Long term (current) use of anticoagulants: Secondary | ICD-10-CM | POA: Diagnosis not present

## 2014-11-17 DIAGNOSIS — I69123 Fluency disorder following nontraumatic intracerebral hemorrhage: Secondary | ICD-10-CM | POA: Diagnosis not present

## 2014-11-17 DIAGNOSIS — J129 Viral pneumonia, unspecified: Secondary | ICD-10-CM | POA: Diagnosis not present

## 2014-11-17 DIAGNOSIS — R509 Fever, unspecified: Secondary | ICD-10-CM | POA: Diagnosis not present

## 2014-11-17 DIAGNOSIS — I4892 Unspecified atrial flutter: Secondary | ICD-10-CM | POA: Diagnosis not present

## 2014-11-17 DIAGNOSIS — A419 Sepsis, unspecified organism: Secondary | ICD-10-CM | POA: Diagnosis not present

## 2014-11-17 DIAGNOSIS — N183 Chronic kidney disease, stage 3 (moderate): Secondary | ICD-10-CM | POA: Diagnosis not present

## 2014-11-17 DIAGNOSIS — B9620 Unspecified Escherichia coli [E. coli] as the cause of diseases classified elsewhere: Secondary | ICD-10-CM | POA: Diagnosis not present

## 2014-11-17 DIAGNOSIS — I5042 Chronic combined systolic (congestive) and diastolic (congestive) heart failure: Secondary | ICD-10-CM | POA: Diagnosis not present

## 2014-11-17 DIAGNOSIS — M6281 Muscle weakness (generalized): Secondary | ICD-10-CM | POA: Diagnosis not present

## 2014-11-17 DIAGNOSIS — I4891 Unspecified atrial fibrillation: Secondary | ICD-10-CM | POA: Diagnosis not present

## 2014-11-17 DIAGNOSIS — Z960 Presence of urogenital implants: Secondary | ICD-10-CM | POA: Diagnosis not present

## 2014-11-17 DIAGNOSIS — A4151 Sepsis due to Escherichia coli [E. coli]: Secondary | ICD-10-CM | POA: Diagnosis not present

## 2014-11-17 DIAGNOSIS — I429 Cardiomyopathy, unspecified: Secondary | ICD-10-CM | POA: Diagnosis not present

## 2014-11-17 DIAGNOSIS — E43 Unspecified severe protein-calorie malnutrition: Secondary | ICD-10-CM | POA: Diagnosis not present

## 2014-11-17 DIAGNOSIS — R2689 Other abnormalities of gait and mobility: Secondary | ICD-10-CM | POA: Diagnosis not present

## 2014-11-17 DIAGNOSIS — R278 Other lack of coordination: Secondary | ICD-10-CM | POA: Diagnosis not present

## 2014-11-17 DIAGNOSIS — I495 Sick sinus syndrome: Secondary | ICD-10-CM | POA: Diagnosis not present

## 2014-11-17 DIAGNOSIS — N19 Unspecified kidney failure: Secondary | ICD-10-CM | POA: Diagnosis not present

## 2014-11-17 MED ORDER — SENNOSIDES-DOCUSATE SODIUM 8.6-50 MG PO TABS: 2.0000 | ORAL_TABLET | Freq: Two times a day (BID) | ORAL | Status: DC

## 2014-11-17 MED ORDER — CIPROFLOXACIN HCL 500 MG PO TABS
500.0000 mg | ORAL_TABLET | Freq: Two times a day (BID) | ORAL | Status: DC
  Administered 2014-11-17: 500 mg via ORAL
  Filled 2014-11-17: qty 1

## 2014-11-17 MED ORDER — CIPROFLOXACIN HCL 500 MG PO TABS: 500.0000 mg | ORAL_TABLET | Freq: Two times a day (BID) | ORAL | Status: DC

## 2014-11-17 MED ORDER — OXYCODONE-ACETAMINOPHEN 10-325 MG PO TABS: 1.0000 | ORAL_TABLET | ORAL | Status: DC | PRN

## 2014-11-17 MED ORDER — ENSURE ENLIVE PO LIQD: 237.0000 mL | Freq: Two times a day (BID) | ORAL | Status: DC

## 2014-11-17 MED ORDER — DILTIAZEM HCL ER COATED BEADS 120 MG PO CP24: 120.0000 mg | ORAL_CAPSULE | Freq: Every day | ORAL | Status: DC

## 2014-11-17 MED ORDER — ASPIRIN 81 MG PO CHEW: 81.0000 mg | CHEWABLE_TABLET | Freq: Every day | ORAL | Status: DC

## 2014-11-17 MED ORDER — APIXABAN 5 MG PO TABS: 5.0000 mg | ORAL_TABLET | Freq: Two times a day (BID) | ORAL | Status: DC

## 2014-11-17 MED ORDER — BISACODYL 10 MG RE SUPP: 10.0000 mg | Freq: Every day | RECTAL | Status: DC | PRN

## 2014-11-17 NOTE — Care Management (Signed)
Important Message  Patient Details  Name: [name] MRN: [id number] Date of Birth: Sep 29, 1945   Medicare Important Message Given:  Yes-third notification given    Delorse Lek 11/17/2014, 10:41 AM

## 2014-11-17 NOTE — Progress Notes (Signed)
Subjective: Yesterday, he reported not feeling well and refused PT/OT as well as Mg citrate. Upon speaking with him, he felt his body was not ready to go but was willing to try an enema after which he had two bowel movements. This morning, he was agreeable to going to SNF and denied any complaints of pain though later told social work that the medical providers had told him he would be here for another 2 days.  Objective: Vital signs in last 24 hours: Filed Vitals:   11/16/14 1824 11/16/14 2107 11/16/14 2115 11/17/14 0538  BP: 110/85 109/94  110/87  Pulse: 80 121  80  Temp: 98.5 F (36.9 C) 97.7 F (36.5 C)  97.4 F (36.3 C)  TempSrc: Oral Oral  Oral  Resp: 18 17  19   Height:      Weight:   153 lb 9.6 oz (69.673 kg)   SpO2: 100% 98%  100%   Weight change:   Intake/Output Summary (Last 24 hours) at 11/17/14 0717 Last data filed at 11/17/14 5170  Gross per 24 hour  Intake   1340 ml  Output   2526 ml  Net  -1186 ml   General: thin elderly African American male, resting in bed, NAD  HEENT: PERRL, EOMI, no scleral icterus Cardiac: irregular rate, no rubs, murmurs or gallops Pulm: clear to auscultation bilaterally Abd: soft, non-tender, nondistended, BS present Ext: warm and well perfused, no pedal edema Neuro: responds to questions appropriately; moving all extremities freely  Lab Results: Basic Metabolic Panel:  Recent Labs Lab 11/15/14 0348 11/16/14 0555  NA 138 140  K 4.6 4.5  CL 111 106  CO2 20* 26  GLUCOSE 106* 98  BUN 33* 29*  CREATININE 1.58* 1.63*  CALCIUM 7.9* 8.4*   Cardiac Enzymes:  Recent Labs Lab 11/13/14 0628 11/13/14 1005 11/14/14 0340  TROPONINI 0.05* 0.05* 0.05*   Medications: I have reviewed the patient's current medications. Scheduled Meds: . apixaban  5 mg Oral BID  . buPROPion  75 mg Oral BID  . ciprofloxacin  400 mg Intravenous Q12H  . digoxin  0.0625 mg Oral Daily  . diltiazem  120 mg Oral Daily  . feeding supplement (ENSURE  ENLIVE)  237 mL Oral BID BM  . magnesium citrate  1 Bottle Oral Once  . metoprolol succinate  200 mg Oral Daily  . pantoprazole  40 mg Oral Daily  . rosuvastatin  20 mg Oral q1800  . senna-docusate  1 tablet Oral BID  . tiotropium  18 mcg Inhalation Daily   Continuous Infusions:   PRN Meds:.acetaminophen, ipratropium-albuterol, [DISCONTINUED] oxyCODONE-acetaminophen **AND** oxyCODONE, ramelteon Assessment/Plan:  E.coli sepsis 2/2 UTI: Urine cultures 6/24 notable for E.coli along with 1/2 blood cultures sensitive to multiple antibiotics. Systolic BP overnight again mostly 100s but no fever x 5 days. Leukocytosis resolved yesterday.  -Discharge on ciprofloxacin 400mg  IV->PO twice daily given prior PCN rash [Abx Day 7] -Consult PT/OT to help him ambulate and mobilize  Constipation: Resolved from enema. Likely from inadequate prophylaxis. -Discharge on Senokot-S 2 tablets twice daily.  COPD: Home medications include albuterol inhaler 2 puffs as needed.   -Continue Duonebs and albuterol nebulizer as needed for wheezing  Chronic atrial fibrillation: Rate controlled now with HR mostly 70s-80s x 2 days. Per conversations with Dr. Rayann Heman, he has tolerated heart rates as high as 140s and 150s for quite some time and has known him for several years as an outpatient. CHADSVASC score 6  which is consistent with a  9.7 % stroke/TIA/embolism. HAS-BLED score 4 which is consistent with 8.7 bleeds per 100 patient-years. Home medications include Coumadin 7.5 mg for INR goal 2-3, Toprol-XL 200 mg, digoxin 0.0625 mg.  -Advise him to continue Eliquis -Discharge on Toprol-XL, Cardizem CD, and digoxin as noted above  Chronic systolic CHF: Echo in May 2016 notable for EF 30-35% with diffuse hypokinesis. S/p pacemaker placement. -Toprol and digoxin as noted above  GERD: Home medications include Prilosec 40 mg daily. -Continue medications  Hyperlipidemia: Home medications include rosuvastatin 20 mg. -Continue  home medications  Depression: Her medications include Wellbutrin 75 mg twice daily. -Continue home medications  HTN: BP trending mostly 100 to 120s/70s-90s. -Toprol, Cardizem as noted above  Chronic pain: He reports taking Percocet 10/325 mg 4-5 times a day for the pain following his hip procedure. -Continue home Percocet and Senokot as noted above  Severe malnutrition: At his most recent hospitalization, he was noted at BMI 19.2 with reported 24 pound weight loss over the last 5 months. -Give Ensure twice daily between meals  #FEN:  -Diet: Regular  #DVT prophylaxis: Eliquis per pharmacy  #CODE STATUS: FULL CODE -Defer to [place] and daughter Davy Pique [phone] if patients lacks decision-making capacity -Confirmed with patient on admission  Dispo: Discharge today.  The patient does have a current PCP (Aldine Contes, MD) and does need an [facility] hospital follow-up appointment after discharge.  The patient does not know have transportation limitations that hinder transportation to clinic appointments.  .Services Needed at time of discharge: Y = Yes, Blank = No PT:   OT:   RN:   Equipment:   Other:     LOS: 8 days   Riccardo Dubin, MD 11/17/2014, 7:17 AM

## 2014-11-17 NOTE — Progress Notes (Signed)
[name] to be D/C'd Skilled nursing facility per MD order. Pt verbalized understanding.    Medication List    STOP taking these medications        warfarin 2.5 MG tablet  Commonly known as:  COUMADIN      TAKE these medications        albuterol 108 (90 BASE) MCG/ACT inhaler  Commonly known as:  VENTOLIN HFA  Inhale 2 puffs into the lungs every 6 (six) hours as needed for wheezing or shortness of breath.     apixaban 5 MG Tabs tablet  Commonly known as:  ELIQUIS  Take 1 tablet (5 mg total) by mouth 2 (two) times daily.     aspirin 81 MG chewable tablet  Chew 1 tablet (81 mg total) by mouth daily.     bisacodyl 10 MG suppository  Commonly known as:  DULCOLAX  Place 1 suppository (10 mg total) rectally daily as needed for severe constipation.     buPROPion 75 MG tablet  Commonly known as:  WELLBUTRIN  Take 1 tablet (75 mg total) by mouth 2 (two) times daily.     ciprofloxacin 500 MG tablet  Commonly known as:  CIPRO  Take 1 tablet (500 mg total) by mouth 2 (two) times daily.     Digoxin 62.5 MCG Tabs  Take 0.0625 mg by mouth daily.     diltiazem 120 MG 24 hr capsule  Commonly known as:  CARDIZEM CD  Take 1 capsule (120 mg total) by mouth daily.     diphenhydrAMINE 2 % cream  Commonly known as:  BENADRYL  Apply 1 application topically 3 (three) times daily as needed for itching.     feeding supplement (ENSURE ENLIVE) Liqd  Take 237 mLs by mouth 2 (two) times daily between meals.     Melatonin 3 MG Caps  Take by mouth at bedtime. For Insomnia     metoprolol 200 MG 24 hr tablet  Commonly known as:  TOPROL-XL  Take 1 tablet (200 mg total) by mouth daily. Take with or immediately following a meal.     omeprazole 40 MG capsule  Commonly known as:  PRILOSEC  Take 40 mg by mouth daily.     oxyCODONE-acetaminophen 10-325 MG per tablet  Commonly known as:  PERCOCET  Take 1 tablet by mouth every 4 (four) hours as needed for pain. Do not take more than 5 doses  in a 24 hour period.     rosuvastatin 20 MG tablet  Commonly known as:  CRESTOR  Take 1 tablet (20 mg total) by mouth daily at 6 PM.     senna-docusate 8.6-50 MG per tablet  Commonly known as:  [employer] SENNA PLUS  Take 2 tablets by mouth 2 (two) times daily.     tiotropium 18 MCG inhalation capsule  Commonly known as:  SPIRIVA  Place 1 capsule (18 mcg total) into inhaler and inhale daily.        Filed Vitals:   11/17/14 0538  BP: 110/87  Pulse: 80  Temp: 97.4 F (36.3 C)  Resp: 19    Skin clean, dry and intact without evidence of skin break down, no evidence of skin tears noted. IV catheter discontinued intact. Site without signs and symptoms of complications. Dressing and pressure applied. Pt denies pain at this time. No complaints noted.  An After Visit Summary and Discharge Summary was printed and sent to [facility].  Patient leaving unit via stretcher by ambulance service.  Marijean Heath C 11/17/2014 4:14 PM

## 2014-11-17 NOTE — Clinical Social Work Placement (Signed)
   CLINICAL SOCIAL WORK PLACEMENT  NOTE  Date:  11/17/2014  Patient Details  Name: [name] MRN: [id number] Date of Birth: [birth year]  Clinical Social Work is seeking post-discharge placement for this patient at the [place] level of care (*CSW will initial, date and re-position this form in  chart as items are completed):  Yes   Patient/family provided with [place] Work Department's list of facilities offering this level of care within the geographic area requested by the patient (or if unable, by the patient's family).  Yes   Patient/family informed of their freedom to choose among providers that offer the needed level of care, that participate in Medicare, Medicaid or managed care program needed by the patient, have an available bed and are willing to accept the patient.  Yes   Patient/family informed of [facility]'s ownership interest in [facility] and [facility], as well as of the fact that they are under no obligation to receive care at these facilities.  PASRR submitted to EDS on       PASRR number received on       Existing PASRR number confirmed on 11/15/14     FL2 transmitted to all facilities in geographic area requested by pt/family on       FL2 transmitted to all facilities within larger geographic area on 11/15/14     Patient informed that his/her managed care company has contracts with or will negotiate with certain facilities, including the following:            Patient/family informed of bed offers received.  Patient chooses bed at  [facility]     Physician recommends and patient chooses bed at      Patient to be transferred to  [facility] on  11/17/14.  Patient to be transferred to facility by  ambulance     Patient family notified on  11/17/14 of transfer.  Name of family member notified:   Daughter [name] [phone]).  PHYSICIAN       Additional Comment:     _______________________________________________ Sable Feil, LCSW 11/17/2014, 1:50 PM

## 2014-11-17 NOTE — Progress Notes (Signed)
PT Cancellation Note  Patient Details Name: [name] MRN: [id number] DOB: 20-Jan-1946   Cancelled Treatment:    Reason Eval/Treat Not Completed: Patient declined, no reason specified. Pt stating he just got back in bed and he is supposed to go to rehab today or tomorrow.  Pt declined despite encouragement.   [name] 11/17/2014, 12:55 PM

## 2014-11-17 NOTE — Progress Notes (Signed)
Attempted report to [facility] facility x 2; facility kept writer on hold so unable to give report at this time.

## 2014-11-17 NOTE — Patient Outreach (Signed)
[facility]) Care Management  11/17/2014  [name] 1945/10/18 [id number]   CSW received a call from [facility] Liaison with [facility], reporting that patient was being discharged from [facility] today and being transported, via non-emergency ambulance transport with [facility] to [facility] [place] for rehabilitative services.  CSW will continue to follow patient while at the skilled facility to provide assistance with discharge planning needs.  CSW will make arrangements to meet with patient for the initial visit on Wednesday, July 6th at [place], [place], MSW, [place] [facility] [place], [place], [place] [address] [name] [phone]

## 2014-11-18 ENCOUNTER — Other Ambulatory Visit: Payer: Self-pay

## 2014-11-18 MED ORDER — OXYCODONE-ACETAMINOPHEN 10-325 MG PO TABS: 1.0000 | ORAL_TABLET | ORAL | Status: DC | PRN

## 2014-11-18 NOTE — Telephone Encounter (Signed)
[facility] 

## 2014-11-22 ENCOUNTER — Non-Acute Institutional Stay (SKILLED_NURSING_FACILITY): Payer: Commercial Managed Care - HMO | Admitting: Internal Medicine

## 2014-11-22 ENCOUNTER — Telehealth: Payer: Self-pay | Admitting: Internal Medicine

## 2014-11-22 DIAGNOSIS — J449 Chronic obstructive pulmonary disease, unspecified: Secondary | ICD-10-CM

## 2014-11-22 DIAGNOSIS — I5042 Chronic combined systolic (congestive) and diastolic (congestive) heart failure: Secondary | ICD-10-CM | POA: Diagnosis not present

## 2014-11-22 DIAGNOSIS — I4891 Unspecified atrial fibrillation: Secondary | ICD-10-CM

## 2014-11-22 NOTE — Telephone Encounter (Signed)
Call to patient to confirm appointment for 11/23/14 at 1:30 lmtcb

## 2014-11-23 ENCOUNTER — Ambulatory Visit: Payer: Commercial Managed Care - HMO | Admitting: Pharmacist

## 2014-11-23 ENCOUNTER — Ambulatory Visit: Payer: Commercial Managed Care - HMO | Admitting: Internal Medicine

## 2014-11-23 ENCOUNTER — Encounter: Payer: Self-pay | Admitting: Internal Medicine

## 2014-11-23 ENCOUNTER — Encounter: Payer: Self-pay | Admitting: *Deleted

## 2014-11-23 ENCOUNTER — Other Ambulatory Visit: Payer: Self-pay | Admitting: *Deleted

## 2014-11-23 ENCOUNTER — Ambulatory Visit (INDEPENDENT_AMBULATORY_CARE_PROVIDER_SITE_OTHER): Payer: Medicare HMO | Admitting: Internal Medicine

## 2014-11-23 VITALS — BP 110/70 | HR 94 | Ht 75.0 in | Wt 151.5 lb

## 2014-11-23 DIAGNOSIS — I495 Sick sinus syndrome: Secondary | ICD-10-CM | POA: Diagnosis not present

## 2014-11-23 DIAGNOSIS — I4892 Unspecified atrial flutter: Secondary | ICD-10-CM

## 2014-11-23 DIAGNOSIS — I428 Other cardiomyopathies: Secondary | ICD-10-CM

## 2014-11-23 DIAGNOSIS — I429 Cardiomyopathy, unspecified: Secondary | ICD-10-CM

## 2014-11-23 LAB — CUP PACEART INCLINIC DEVICE CHECK
Battery Voltage: 2.79 V
Date Time Interrogation Session: 20160706155935
Lead Channel Pacing Threshold Amplitude: 1 V
Lead Channel Pacing Threshold Pulse Width: 0.5 ms
Lead Channel Setting Pacing Amplitude: 2.5 V
MDC IDC MSMT BATTERY IMPEDANCE: 1000 Ohm — AB
MDC IDC MSMT LEADCHNL RV IMPEDANCE VALUE: 395 Ohm
MDC IDC MSMT LEADCHNL RV SENSING INTR AMPL: 7.2 mV
MDC IDC SET LEADCHNL RV PACING PULSEWIDTH: 0.5 ms
MDC IDC SET LEADCHNL RV SENSING SENSITIVITY: 2.5 mV
Pulse Gen Model: 5626
Pulse Gen Serial Number: 2045834

## 2014-11-23 NOTE — Patient Outreach (Signed)
[facility]) Care Management  [facility] Social Work  11/23/2014  [name] 29-Dec-1945 [id number]  Subjective:    Objective:   Current Medications:  Current Outpatient Prescriptions  Medication Sig Dispense Refill  . albuterol (VENTOLIN HFA) 108 (90 BASE) MCG/ACT inhaler Inhale 2 puffs into the lungs every 6 (six) hours as needed for wheezing or shortness of breath. 1 Inhaler 3  . apixaban (ELIQUIS) 5 MG TABS tablet Take 1 tablet (5 mg total) by mouth 2 (two) times daily. 60 tablet 3  . aspirin 81 MG chewable tablet Chew 1 tablet (81 mg total) by mouth daily. 30 tablet 12  . bisacodyl (DULCOLAX) 10 MG suppository Place 1 suppository (10 mg total) rectally daily as needed for severe constipation. 12 suppository 0  . buPROPion (WELLBUTRIN) 75 MG tablet Take 1 tablet (75 mg total) by mouth 2 (two) times daily. 60 tablet 5  . digoxin 62.5 MCG TABS Take 0.0625 mg by mouth daily. 30 tablet 2  . diltiazem (CARDIZEM CD) 120 MG 24 hr capsule Take 1 capsule (120 mg total) by mouth daily. 30 capsule 3  . diphenhydrAMINE (BENADRYL) 2 % cream Apply 1 application topically 3 (three) times daily as needed for itching.    . feeding supplement, ENSURE ENLIVE, (ENSURE ENLIVE) LIQD Take 237 mLs by mouth 2 (two) times daily between meals. 237 mL 12  . metoprolol (TOPROL-XL) 200 MG 24 hr tablet Take 1 tablet (200 mg total) by mouth daily. Take with or immediately following a meal. 30 tablet 11  . omeprazole (PRILOSEC) 40 MG capsule Take 40 mg by mouth daily.    [name] oxyCODONE-acetaminophen (PERCOCET) 10-325 MG per tablet Take 1 tablet by mouth every 4 (four) hours as needed for pain. Do not take more than 5 doses in a 24 hour period. 120 tablet 0  . rosuvastatin (CRESTOR) 20 MG tablet Take 1 tablet (20 mg total) by mouth daily at 6 PM. 30 tablet 2  . senna-docusate ([employer] SENNA PLUS) 8.6-50 MG per tablet Take 2 tablets by mouth 2 (two) times daily. 120 tablet 3  . tiotropium (SPIRIVA) 18 MCG  inhalation capsule Place 1 capsule (18 mcg total) into inhaler and inhale daily. 30 capsule 5   No current facility-administered medications for this visit.    Functional Status:  In your present state of health, do you have any difficulty performing the following activities: 11/23/2014 11/09/2014  Hearing? N -  Vision? Y -  Difficulty concentrating or making decisions? Y -  Walking or climbing stairs? Y -  Dressing or bathing? Y -  Doing errands, shopping? [name]  Preparing Food and eating ? Y -  Using the Toilet? N -  In the past six months, have you accidently leaked urine? N -  Do you have problems with loss of bowel control? N -  Managing your Medications? Y -  Managing your Finances? Y -  Housekeeping or managing your Housekeeping? Y -    Fall/Depression Screening:  PHQ 2/9 Scores 11/23/2014 11/09/2014 10/24/2014 08/10/2014 07/28/2014 06/22/2014 06/01/2014  PHQ - 2 Score 1 2 0 0 0 0 0  PHQ- 9 Score 8 14 - - - - -  Exception Documentation Patient refusal - - - - - -    Assessment:   CSW was able to meet with patient today to perform the initial visit at [place], [place] where patient currently resides to receive rehabilitative services.  Patient was alert and oriented at the time of  CSW's arrival.  CSW first obtained two HIPAA compliant identifiers from patient, including name and date of birth, and was able to get verbal consent from patient to converse with CSW, as well as allow CSW to make possible referrals to various community agencies and resources of interest. During the assessment, patient was very adamant and "matter of fact" about returning home to live with his son, [name] upon discharge from [facility].  Patient was unwilling to discuss alternative options, such as assisted living for long-term care placement.  CSW was able to confirm with patient that patient's son continues to drive a truck, gone for several days at time, leaving patient at home alone to  provide for his own activities of daily living.  Patient is optimistic that his daughter, [name] will be able to spend more time with him in the home, assisting with meal preparation, grocery shopping, light house-keeping duties, bill payment, etc. CSW was only with patient for a short period of time before patient's physical therapist at [facility] came around to work with him.  Directly after working with the physical therapist, patient needed to work with the occupational therapist, then get cleaned up and ready for transport to his physician appointment at 1:30pm.  CSW agreed to continue to follow patient while at [facility], as well as upon returning home, to assist with possible discharge planning needs and services.  CSW also agreed to contact the Licensed Clinicial Social Worker/Admissions Coordinator at [facility] to request involvement with patient's Discharge Plan of Care Meeting.  Plan:   CSW will continue to follow patient while at [facility] [place] where patient currently resides to receive rehabilitative services, to assist with possible discharge planning needs and services.  [name], BSW, MSW, [place] [facility] [place], [place], [place] [address] [name] [phone]

## 2014-11-23 NOTE — Patient Instructions (Signed)
Medication Instructions: - no changes  Labwork: - none  Procedures/Testing: - none  Follow-Up: - Your physician wants you to follow-up in: 3 months with Dr. Rayann Heman.You will receive a reminder letter in the mail two months in advance. If you don't receive a letter, please call our office to schedule the follow-up appointment.  Any Additional Special Instructions Will Be Listed Below (If Applicable).

## 2014-11-23 NOTE — Progress Notes (Signed)
PCP: Aldine Contes, MD   [name] is a 69 y.o. male who presents today for electrophysiology followup.  Since his hospital discharge, the patient reports doing well.   He is making slow progress with rehab.  He has occasional dizziness. Today, he denies symptoms of palpitations, chest pain, shortness of breath,  lower extremity edema, presyncope, or syncope.  The patient is otherwise without complaint today.   Past Medical History  Diagnosis Date  . Hypertension   . Chronic systolic heart failure     a. NICM - EF 35% with normal cors 2003. b. Last echo 11/2012 - EF 25-30%.  . Depression   . GERD (gastroesophageal reflux disease)   . Portal vein thrombosis   . Avascular necrosis of bones of both hips     Status post bilateral hip replacements  . Gastritis   . Alcohol abuse   . Erectile dysfunction   . Bipolar disorder   . Hydrocele, unspecified   . Lung nodule     Chest CT scan on 12/14/2008 showed a nodular opacity at the left lung base felt to most likely represent scarring.  Follow-up chest CT scan on 06/02/2010 showed parenchymal scarring in the left apex, left  lower lobe, and lingula; some of this scarring at the left base had a nodular appearance, unchanged. Chest 09/2012 - stable.  . 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  Thyroid scan 08/28/2009 was normal with no focal areas of abnormal increased or decreased activity seen; the uptake of I 131 sodium iodide at 24 hours was 5.7%.  TSH and free T4 normalized by 08/16/2009.  . Intestinal obstruction   . COPD (chronic obstructive pulmonary disease)   . E coli bacteremia   . DVT (deep venous thrombosis)     He has hypercoagulability with multiple prior DVTs  . Pleural plaque     H/o asbestos exposure. Chest CT on 06/02/2010 showed stable extensive calcified pleural plaques involving the left hemithorax, consistent with asbestos related pleural disease.  . Weight loss     Normal  colonoscopy by Dr. Olevia Perches on 11/06/2010.  . Tachycardia-bradycardia syndrome     a. 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 b. St. Jude gen change 2010.  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  . Noncompliance   . Thrombocytopenia   . Osteoarthritis   . Spondylosis   . Anxiety   . CKD (chronic kidney disease)     Renal U/S 12/04/2009 showed no pathological findings. Labs 12/04/2009 include normal ESR, C3, C4; neg ANA; SPEP showed nonspecific increase in the alpha-2 region with no M-spike; UPEP showed no monoclonal free light chains; urine IFE showed polyclonal increase in feree Kappa and/or free Lambda light chains. Baseline Cr reported 1.7-2.5.  . STEMI (ST elevation myocardial infarction) 10/11/2014  . Hyperlipemia    Past Surgical History  Procedure Laterality Date  . Total hip arthroplasty Right 03/08/2008     By Dr. Hiram Comber  . Tee without cardioversion N/A 05/27/2013    Procedure: TRANSESOPHAGEAL ECHOCARDIOGRAM (TEE);  Surgeon: Candee Furbish, MD;  Location: [facility] ENDOSCOPY;  Service: Cardiovascular;  Laterality: N/A;  . Cardioversion N/A 05/27/2013    Procedure: CARDIOVERSION;  Surgeon: Candee Furbish, MD;  Location: [facility] ENDOSCOPY;  Service: Cardiovascular;  Laterality: N/A;  . Cardiac catheterization  2003    Nl cors, EF 35%  . Embolectomy Right 12/10/2013  Procedure: Right Femoral Embolectomy; Fasciotomy Right Lower Leg with Intraoperative arteriogram;  Surgeon: Rosetta Posner, MD;  Location: [place];  Service: Vascular;  Laterality: Right;  . Intraoperative arteriogram Right 12/10/2013    Procedure: INTRA OPERATIVE ARTERIOGRAM;  Surgeon: Rosetta Posner, MD;  Location: [place];  Service: Vascular;  Laterality: Right;  . Fasciotomy closure Right 12/15/2013    Procedure: LEG FASCIOTOMY CLOSURE;  Surgeon: Rosetta Posner, MD;  Location: [facility] OR;  Service: Vascular;  Laterality: Right;  . Embolectomy Left  10/12/2014    Procedure: Left Femoral Thrombectomy with intraoperative arteriogram;  Surgeon: Rosetta Posner, MD;  Location: [facility] OR;  Service: Vascular;  Laterality: Left;  . Hemiarthroplasty hip Left 09/09/2002    bipolar; Dr. Lafayette Dragon  . Joint replacement    . Insert / replace / remove pacemaker  02/2003    Dr. Roe Rutherford, [place] pulse generator identity SR model [id number] serial [id number]; gen change by Greggory Brandy  . Insert / replace / remove pacemaker  06/17/2008    Lead and generator change w/ [employer] Jude Medical Tendril ST model [id number] (serial  [id number]).         ROS- all systems are reviewed and negative except as per HPI above  Current Outpatient Prescriptions  Medication Sig Dispense Refill  . albuterol (VENTOLIN HFA) 108 (90 BASE) MCG/ACT inhaler Inhale 2 puffs into the lungs every 6 (six) hours as needed for wheezing or shortness of breath. 1 Inhaler 3  . apixaban (ELIQUIS) 5 MG TABS tablet Take 1 tablet (5 mg total) by mouth 2 (two) times daily. 60 tablet 3  . aspirin 81 MG chewable tablet Chew 1 tablet (81 mg total) by mouth daily. 30 tablet 12  . bisacodyl (DULCOLAX) 10 MG suppository Place 1 suppository (10 mg total) rectally daily as needed for severe constipation. 12 suppository 0  . buPROPion (WELLBUTRIN) 75 MG tablet Take 1 tablet (75 mg total) by mouth 2 (two) times daily. 60 tablet 5  . digoxin 62.5 MCG TABS Take 0.0625 mg by mouth daily. 30 tablet 2  . diltiazem (CARDIZEM CD) 120 MG 24 hr capsule Take 1 capsule (120 mg total) by mouth daily. 30 capsule 3  . diphenhydrAMINE (BENADRYL) 2 % cream Apply 1 application topically 3 (three) times daily as needed for itching.    . feeding supplement, ENSURE ENLIVE, (ENSURE ENLIVE) LIQD Take 237 mLs by mouth 2 (two) times daily between meals. 237 mL 12  . metoprolol (TOPROL-XL) 200 MG 24 hr tablet Take 1 tablet (200 mg total) by mouth daily. Take with or immediately following a meal. 30 tablet 11  . omeprazole (PRILOSEC) 40 MG  capsule Take 40 mg by mouth daily.    [name] oxyCODONE-acetaminophen (PERCOCET) 10-325 MG per tablet Take 1 tablet by mouth every 4 (four) hours as needed for pain. Do not take more than 5 doses in a 24 hour period. 120 tablet 0  . rosuvastatin (CRESTOR) 20 MG tablet Take 1 tablet (20 mg total) by mouth daily at 6 PM. 30 tablet 2  . senna-docusate ([employer] SENNA PLUS) 8.6-50 MG per tablet Take 2 tablets by mouth 2 (two) times daily. 120 tablet 3  . tiotropium (SPIRIVA) 18 MCG inhalation capsule Place 1 capsule (18 mcg total) into inhaler and inhale daily. 30 capsule 5   No current facility-administered medications for this visit.    Physical Exam: Filed Vitals:   11/23/14 1140  BP: 110/70  Pulse: 94  Height: '6\' 3"'  (1.905  m)  Weight: 68.72 kg (151 lb 8 oz)    GEN- The patient is chronically ill appearing, alert and oriented x 3 today.   Head- normocephalic, atraumatic Eyes-  Sclera clear, conjunctiva pink Ears- hearing intact Oropharynx- clear Lungs- Clear to ausculation bilaterally, normal work of breathing Chest- pacemaker pocket is well healed Heart- irregular rate and rhythm, no murmurs, rubs or gallops, PMI not laterally displaced GI- soft, NT, ND, + BS Extremities- no clubbing, cyanosis, or edema  Pacemaker interrogation- reviewed in detail today,  See PACEART report ekg today reveals afib, V rates 94 bpm  Assessment and Plan:  1. Bradycardia/ tachycardia syndrome Normal pacemaker function See Pace Art report No changes today V rates appear a little better Our options are limited  2. afib Permanent He has failed multaq, tikosyn, and amiodarone Continue current plan The importance of compliance with anticoagulation was stressed today  3. Chronic systolic dysfunction Likely tachycardia mediated unfortunately given that he has failed medical therapy and has venous occlusions, our options are limited Given recurrent infections and bacteremia, I would not advise AV nodal  ablation for this patient  Return in 3 months

## 2014-11-24 ENCOUNTER — Other Ambulatory Visit: Payer: Self-pay | Admitting: Internal Medicine

## 2014-11-28 NOTE — Progress Notes (Addendum)
Patient ID: [name], male   DOB: 11/29/1945, 69 y.o.   MRN: [id number]                HISTORY & PHYSICAL  DATE:  11/22/2014           FACILITY: [facility]                       LEVEL OF CARE:   SNF   CHIEF COMPLAINT:  Admission to SNF, post stay at [facility], 11/09/2014 through 11/17/2014.    HISTORY OF PRESENT ILLNESS:  This is a 69 year-old man with multiple medical issues.     He presented, I think, for routine clinic review with a pounding sensation in his chest.  Noted to have a pulse rate of 138.    He recently was in hospital from 10/11/2014 through 10/19/2014 for persistent atrial fibrillation with rapid ventricular response with systemic embolization due to non-adherence of anticoagulation.  He had an ST-elevation MI, a TIA, and a lower extremity ischemia that required embolectomy.  His recent echo had shown an EF of 30-35% with diffuse hypokinesis.    He was also in hospital from 10/24/2014 through 10/27/2014, again for atrial fibrillation with rapid ventricular response.  At that point, his Lopressor at 75 t.i.d. was changed to Toprol XL at 200 along with digoxin at 0.0625.    On this admission, he had no inciting factor to his tachycardia.  He was started back on Apixaban 5 mg twice daily with a CHADS vascular score of 6.  Cardiology was consulted and recommended increasing the Toprol XL from 200 once a day to 150 b.i.d., continuing his digoxin.  He also was restarted on Cardizem CD 120 q.d.    He was also noted to have E.coli sepsis secondary to a UTI.  This was during the hospitalization.  White count was 20,000.  Urine culture and blood culture, 1/2 notable for E.coli.  He was started on Aztreonam and then switched to ciprofloxacin.  The E.coli was ESBL.  He was discharged on Cipro 500 twice daily for 3.5 days to complete a 10-day course.     PAST MEDICAL HISTORY/PROBLEM LIST:                    Chronic atrial fibrillation with recurrent rapid ventricular  response.    Recent E.coli sepsis secondary to a UTI, noted above.    Chronic systolic heart failure, with an echo in May 2016 notable for an EF of 30-35% with diffuse hypokinesis in the setting of an ST-elevation MI.    Gastroesophageal reflux.  On longstanding Prilosec.    Hyperlipidemia.     Depression.  Maintained on Wellbutrin.    Hypertension.    Chronic pain following a hip procedure.    Constipation.    Severe malnutrition with a notable BMI of 19.2 and a 24-pound weight loss.  He is on Ensure b.i.d.     COPD.     History of a DVT.    Tachy-brady syndrome.     Osteoarthritis.    Spondylosis.    Chronic renal insufficiency.     Coronary artery disease with a recent ST-elevation MI.    History of lung nodule.    Bipolar disorder.    PAST SURGICAL HISTORY:                  Total hip arthroplasty in 2009.    TEE with cardioversion in  January 2015.    Cardiac catheterization in 2003 with an EF of 35%.    Right embolectomy in July 2015.    Left embolectomy in May 2016.    Left hip hemiarthroplasty in 2004.    Pacemaker.      CURRENT MEDICATIONS:  Medication list is reviewed.           Albuterol HFA 2 puffs q.6.    Eliquis 5 b.i.d.     ASA 81 q.d.      Dulcolax 1 suppository rectally p.r.n.     Wellbutrin 75 b.i.d.      Ciprofloxacin 500 b.i.d. for three more days.    Digoxin 0.625 q.d.    Diltiazem 120 q.d.      Benadryl topical cream.    Ensure 1 can b.i.d.      Melatonin 3 mg q.h.s. p.r.n.       Toprol XL 200 mg b.i.d.      Prilosec 40 q.d.     Percocet 10/325 q.4 p.r.n.      Crestor 20 q.d.      Senokot-S 8.6/50, 2 tablets b.i.d.     Spiriva 1 capsule daily.      SOCIAL HISTORY:                   HOUSING:  The patient tells me he lives with his son at home.    FUNCTIONAL STATUS:  Walks with a walker.  He has had two falls in the last year.  Claims to be independent with ADLs.   TOBACCO USE:  Quit smoking two months ago.     FAMILY HISTORY:    MOTHER:  Hypertension, coronary artery disease.  Deceased.   FATHER:  Deceased.            REVIEW OF SYSTEMS:            CHEST/RESPIRATORY:  Chronic shortness of breath on exertion.  Does not seem different to the patient.   CARDIAC:  No complaints of chest pain or palpitations.   GI:  He is complaining of constipation.   GU:  No dysuria.   MUSCULOSKELETAL:  Says he has absolutely no sensation in his right leg from roughly the midpoint down to include his foot.   He does not describe claudication.    PHYSICAL EXAMINATION:   VITAL SIGNS:     TEMPERATURE:  97.8.   PULSE:  70 and irregularly irregular.   RESPIRATIONS:  16.    BLOOD PRESSURE:  118/78.   GENERAL APPEARANCE:  The patient is not in any distress.  Alert and conversational.   CHEST/RESPIRATORY:  There are crackles in the left lower lobe.  Right lung is clear.  Hyperresonant to percussion.       CARDIOVASCULAR:   CARDIAC:  Heart sounds are regular.  Widely split second sound.  No S3.  JVP is not elevated.     EDEMA/VARICOSITIES:  Extremities:  There is no peripheral edema.  He has venous stasis physiology, but no open wounds and no edema.   ARTERIAL:  Peripheral pulses are palpable in his feet.    GASTROINTESTINAL:   LIVER/SPLEEN/KIDNEY:   No liver, no spleen.  No tenderness.    GENITOURINARY:   BLADDER:  Not enlarged.  There is no CVA tenderness.   NEUROLOGICAL:   BALANCE/GAIT:  He is able to bring himself to a standing position.  Wide-based gait and unsteady.     SKIN:   INSPECTION:  No open areas.  However, he has either a large plantar wart on his left heel or a callus.  This is going to need some debridement as it is painful.    ASSESSMENT/PLAN:               Recurrent atrial fibrillation.  His heart rate is currently controlled.  He is back on Eliquis instead of Coumadin on this admission.    Severe systolic heart failure secondary to coronary artery disease.  There is no evidence of this at  the bedside.  He is not on diuretics.  His weight will need to be monitored.    Depression/bipolar.  He seems stable at the bedside.    Loss of sensation in his right distal leg.  I am assuming that this was an acute ischemic episode.    All of his lab work was reviewed in [employer].  All looked stable.  He had a TSH in May that was normal at 2.031.  Hemoglobin A1c on 10/11/2014 was slightly elevated a5 .6.   I think this man has underlying COPD.  His chest x-ray on 11/10/2014 was stable.  He has chronic elevation of the left hemi-diaphragm with stable chronic changes involving the left lung which may explain the ausculatory findings.

## 2014-11-29 ENCOUNTER — Non-Acute Institutional Stay: Payer: Commercial Managed Care - HMO | Admitting: Internal Medicine

## 2014-11-29 DIAGNOSIS — J449 Chronic obstructive pulmonary disease, unspecified: Secondary | ICD-10-CM

## 2014-11-29 DIAGNOSIS — I4891 Unspecified atrial fibrillation: Secondary | ICD-10-CM

## 2014-11-29 DIAGNOSIS — I5042 Chronic combined systolic (congestive) and diastolic (congestive) heart failure: Secondary | ICD-10-CM | POA: Diagnosis not present

## 2014-11-30 ENCOUNTER — Encounter: Payer: Self-pay | Admitting: Internal Medicine

## 2014-11-30 NOTE — Progress Notes (Signed)
Physical Therapy Note  Addendum for missed G-Code    11/01/2014 1600  PT G-Codes **NOT FOR INPATIENT CLASS**  Functional Assessment Tool Used Clinical Observation  Functional Limitation Mobility: Walking and moving around  Mobility: Walking and Moving Around Current Status [phone]) CI  Mobility: Walking and Moving Around Goal Status [phone]) CI   [name] [place], [place]

## 2014-12-02 ENCOUNTER — Encounter: Payer: Self-pay | Admitting: Vascular Surgery

## 2014-12-03 NOTE — Progress Notes (Addendum)
Patient ID: [name], male   DOB: [birth year], 69 y.o.   MRN: [id number]                PROGRESS NOTE  DATE:  11/29/2014         FACILITY: [facility]                  LEVEL OF CARE:   SNF   Acute Visit             CHIEF COMPLAINT:  Follow up admission last week.      HISTORY OF PRESENT ILLNESS:  [name] is a gentleman who came to [place] with a history of recurrent atrial fibrillation and rapid ventricular response for which he has had several recent hospitalizations including 11/09/2014 through 11/17/2014, 10/11/2014 through 10/19/2014.   On his most recent admission, he was started back on apixaban 5 mg twice daily.  He had his Toprol XL increased to b.i.d., continued his Digoxin, and restarted on Cardizem CD.    He was also noted to have E.coli sepsis secondary to a UTI.  Ultimately, he was discharged on ciprofloxacin of which he has completed a 10-day course.    I note that he has been back to see Dr. Rayann Heman of Cardiology.  He is noted to have chronic systolic heart failure with an EF of 35%, last echo on 11/18/2012.  This is felt to be non-ischemic.  He also is status post pacemaker, changed in 2014.    CURRENT MEDICATIONS:  Medication list is reviewed.             Lipitor 80 q.d.     Senokot-S 2 tablets p.o. b.i.d.     Ensure 1 can b.i.d.     Oxycodone/acetaminophen 10/325 p.r.n.      Bisacodyl daily p.r.n.       Apixaban  5 mg b.i.d.      ASA 81 q.d.      Wellbutrin 75 b.i.d.     Digoxin 0.0625 daily.     Diltiazem 120 q.d.      Melatonin 3 mg q.h.s.      Toprol XL 200 once daily.    Prilosec 40 daily.    Crestor 20 daily.    LABORATORY DATA:   Lab work on 11/28/2014:    CBC:   White count 7.1, hemoglobin 12.9, platelets 264.    Sodium 146, potassium 4.7, BUN 27, creatinine 1.22, calcium 8.6.    REVIEW OF SYSTEMS:    GENERAL:  He is complaining of insomnia.  States his current medication gives him "nightmares".   CHEST/RESPIRATORY:  No shortness  of breath.   CARDIAC:  No chest pain and no palpitations.   GI: no abdominal pain or diarrhea  PHYSICAL EXAMINATION:   VITAL SIGNS:     PULSE:  62 and still in atrial fibrillation.   BLOOD PRESSURE:  120/74.   WEIGHT:  151 pounds.   GENERAL APPEARANCE:  The patient really looks quite well.   CHEST/RESPIRATORY:  Exam is clear.      CARDIOVASCULAR:   CARDIAC:  Other than the irregular heart sounds, there is no S3.   JVP is not elevated.   EDEMA/VARICOSITIES:   He has no edema.   GASTROINTESTINAL:   ABDOMEN:  Soft, nontender.  No masses.      ASSESSMENT/PLAN:                Non-ischemic cardiomyopathy, ?tachycardia-induced.  Right now, this all appears to be  stable.  His sodium is 146, but he is not on diuretics. No evidence of CHF  Refractory atrial fibrillation.  Apparently, he has been refractory to amiodarone, Tikosyn, and Multaq.  He is anticoagulated and has a pacemaker in place. His heart rate is controlled  Probable COPD.   This also appears to be stable.    Insomnia.  I am going to discontinue the melatonin and put him on Restoril.

## 2014-12-05 ENCOUNTER — Ambulatory Visit: Payer: Medicare HMO | Admitting: *Deleted

## 2014-12-05 ENCOUNTER — Telehealth: Payer: Self-pay | Admitting: Vascular Surgery

## 2014-12-05 ENCOUNTER — Other Ambulatory Visit: Payer: Self-pay | Admitting: *Deleted

## 2014-12-05 NOTE — Telephone Encounter (Signed)
-----   Message from Orlene Plum sent at 12/05/2014 11:21 AM EDT ----- Hinton Dyer, this is the patient we talked about on Friday.  The PCP ofc is working on referral but it has not come through yet.  Chilon told me on Friday that she hoped to have it today but his appointment is tomorrow so we may need to reschedule.  Thank you, Jackelyn Poling

## 2014-12-05 NOTE — Telephone Encounter (Signed)
[name]- pt's caseworker at [facility] will call [employer] to change PCP listing- pt is seeing [facility] due to ability to coordinate. dpm

## 2014-12-05 NOTE — Telephone Encounter (Signed)
Unable to obtain referral, spoke with pt who will contact his PCP, I will keep this on my radar to see if pt follows up, dpm

## 2014-12-06 ENCOUNTER — Non-Acute Institutional Stay: Payer: Commercial Managed Care - HMO | Admitting: Internal Medicine

## 2014-12-06 ENCOUNTER — Encounter: Payer: Self-pay | Admitting: *Deleted

## 2014-12-06 ENCOUNTER — Ambulatory Visit: Payer: Commercial Managed Care - HMO | Admitting: Vascular Surgery

## 2014-12-06 DIAGNOSIS — F32A Depression, unspecified: Secondary | ICD-10-CM

## 2014-12-06 DIAGNOSIS — I5042 Chronic combined systolic (congestive) and diastolic (congestive) heart failure: Secondary | ICD-10-CM | POA: Diagnosis not present

## 2014-12-06 DIAGNOSIS — F329 Major depressive disorder, single episode, unspecified: Secondary | ICD-10-CM

## 2014-12-06 DIAGNOSIS — I4891 Unspecified atrial fibrillation: Secondary | ICD-10-CM | POA: Diagnosis not present

## 2014-12-06 NOTE — Patient Outreach (Signed)
[facility]) Care Management  [facility] Social Work  12/06/2014  [name] 1945/07/31 [id number]    Current Medications:  Current Outpatient Prescriptions  Medication Sig Dispense Refill  . albuterol (VENTOLIN HFA) 108 (90 BASE) MCG/ACT inhaler Inhale 2 puffs into the lungs every 6 (six) hours as needed for wheezing or shortness of breath. 1 Inhaler 3  . apixaban (ELIQUIS) 5 MG TABS tablet Take 1 tablet (5 mg total) by mouth 2 (two) times daily. 60 tablet 3  . aspirin 81 MG chewable tablet Chew 1 tablet (81 mg total) by mouth daily. 30 tablet 12  . bisacodyl (DULCOLAX) 10 MG suppository Place 1 suppository (10 mg total) rectally daily as needed for severe constipation. 12 suppository 0  . buPROPion (WELLBUTRIN) 75 MG tablet Take 1 tablet (75 mg total) by mouth 2 (two) times daily. 60 tablet 5  . digoxin 62.5 MCG TABS Take 0.0625 mg by mouth daily. 30 tablet 2  . diltiazem (CARDIZEM CD) 120 MG 24 hr capsule Take 1 capsule (120 mg total) by mouth daily. 30 capsule 3  . diphenhydrAMINE (BENADRYL) 2 % cream Apply 1 application topically 3 (three) times daily as needed for itching.    . feeding supplement, ENSURE ENLIVE, (ENSURE ENLIVE) LIQD Take 237 mLs by mouth 2 (two) times daily between meals. 237 mL 12  . metoprolol (TOPROL-XL) 200 MG 24 hr tablet Take 1 tablet (200 mg total) by mouth daily. Take with or immediately following a meal. 30 tablet 11  . omeprazole (PRILOSEC) 40 MG capsule Take 40 mg by mouth daily.    [name] oxyCODONE-acetaminophen (PERCOCET) 10-325 MG per tablet Take 1 tablet by mouth every 4 (four) hours as needed for pain. Do not take more than 5 doses in a 24 hour period. 120 tablet 0  . rosuvastatin (CRESTOR) 20 MG tablet Take 1 tablet (20 mg total) by mouth daily at 6 PM. 30 tablet 2  . senna-docusate ([employer] SENNA PLUS) 8.6-50 MG per tablet Take 2 tablets by mouth 2 (two) times daily. 120 tablet 3  . tiotropium (SPIRIVA) 18 MCG inhalation capsule Place 1  capsule (18 mcg total) into inhaler and inhale daily. 30 capsule 5   No current facility-administered medications for this visit.    Functional Status:  In your present state of health, do you have any difficulty performing the following activities: 11/23/2014 11/09/2014  Hearing? N -  Vision? Y -  Difficulty concentrating or making decisions? Y -  Walking or climbing stairs? Y -  Dressing or bathing? Y -  Doing errands, shopping? [name]  Preparing Food and eating ? Y -  Using the Toilet? N -  In the past six months, have you accidently leaked urine? N -  Do you have problems with loss of bowel control? N -  Managing your Medications? Y -  Managing your Finances? Y -  Housekeeping or managing your Housekeeping? Y -    Fall/Depression Screening:  PHQ 2/9 Scores 12/06/2014 11/23/2014 11/09/2014 10/24/2014 08/10/2014 07/28/2014 06/22/2014  PHQ - 2 Score 0 1 2 0 0 0 0  PHQ- 9 Score _0 - - - -  Exception Documentation Patient refusal Patient refusal - - - - -    Assessment:   CSW was able to meet with patient today at [facility], where patient currently resides to receive rehabilitative services, to perform an initial visit.  CSW was also able to attend patient's discharge planning meeting, offering assistance with  discharge planning needs.  CSW introduced self, explained role and types of services provided through [employer].  Patient remembered having worked with [facility] in the past.  Patient provided CSW with two HIPAA compliant identifiers, which included his name and date of birth.  Patient admitted to [place] that he will be discharged from [facility] tomorrow (Wednesday, July 20th) to return home to live with son, [name] and cousin, [name].  Patient went on to say that Ms. [name] has already moved into patient and [name] home, planning to provide 24 hour care and supervision to patient.  Patient reported being agreeable to this  plan. Patient further reported that the Licensed Clinical Social Worker/Admissions Coordinator at [facility], [name] was able to assist patient with obtaining durable medical equipment for home use.  This equipment includes a raised toilet seat with handrails, shower chair with back, hand held shower device, tub bench and grab bars.  Patient believes that the equipment has already been delivered to his home and that [name] has made arrangements to have all devices properly installed, prior to patient's return home.  Patient will also receive a home health physical therapist for short-term rehabilitative services, through [facility], same agency providing patient's home equipment.  Patient's granddaughter, [name] will be providing transportation for patient to home tomorrow, as well as to patient's physician appointments. CSW explained to patient that CSW was able to make contact with patient's Adult Medicaid Case Worker, [name] [phone]) with the [place], who reports that patient's Adult Medicaid application is still being processed.  However, [name] indicated that she will need all of patient's medical bills, from the [facility], before being able to further process patient's application.  [name] indicated that she needs to be able to prove that patient has met his deductible, which is $4,000.00, in order for patient to qualify for Adult Medicaid coverage.  Patient was encouraged to stop by the Medical Records Department, within the [facility], to request records, as well as sign a consent, upon being released from [place] [facility] tomorrow.  Patient voiced understanding. CSW inquired as to how CSW could be of further assistance to patient at this time.  Patient denied being able to identify any social work specific needs.  However, patient requested CSW's contact information, explaining that he would provide CSW's  contact information to Ms. [name] and Mrs. Tammi Klippel, encouraging them to contact CSW if assistance can be offered for patient.  Patient also reported that he would be "hanging onto" CSW's information, indicating that he may require assistance in the near future.  Patient is aware that CSW will be performing a case closure, until further notice.  Plan:   CSW will no longer make arrangements to contact patient by phone or in person, as all goals of treatment have been met from social work standpoint and no additional social work needs have been identified at this time. CSW will notify patient's Telephonic Nurse Case Manager, Natividad Brood with [facility], of CSW's plans to close patient's case. CSW will submit a case closure request to [name], Care Management Assistant with [facility], in the form of an [employer].   CSW will fax a correspondence letter to patient's Primary Care Physician, Dr. Bertha Stakes to report social work involvement with patient.  [name], BSW, MSW, [facility],  [place], [place] [address] [name] [phone]

## 2014-12-10 DIAGNOSIS — I5022 Chronic systolic (congestive) heart failure: Secondary | ICD-10-CM | POA: Diagnosis not present

## 2014-12-10 DIAGNOSIS — Z8673 Personal history of transient ischemic attack (TIA), and cerebral infarction without residual deficits: Secondary | ICD-10-CM | POA: Diagnosis not present

## 2014-12-10 DIAGNOSIS — Z48812 Encounter for surgical aftercare following surgery on the circulatory system: Secondary | ICD-10-CM | POA: Diagnosis not present

## 2014-12-10 DIAGNOSIS — I129 Hypertensive chronic kidney disease with stage 1 through stage 4 chronic kidney disease, or unspecified chronic kidney disease: Secondary | ICD-10-CM | POA: Diagnosis not present

## 2014-12-10 DIAGNOSIS — I480 Paroxysmal atrial fibrillation: Secondary | ICD-10-CM | POA: Diagnosis not present

## 2014-12-10 DIAGNOSIS — I213 ST elevation (STEMI) myocardial infarction of unspecified site: Secondary | ICD-10-CM | POA: Diagnosis not present

## 2014-12-10 NOTE — Progress Notes (Addendum)
Patient ID: [name], male   DOB: 03/05/1946, 69 y.o.   MRN: [id number]                PROGRESS NOTE  DATE:  12/06/2014         FACILITY: [facility]                       LEVEL OF CARE:   SNF   Acute Visit/Discharge Visit   CHIEF COMPLAINT:  Pre-discharge review.      HISTORY OF PRESENT ILLNESS:  This is a man who came to [place] with a history of recurrent atrial fibrillation, rapid ventricular response, for which he has had several recent hospitalizations.    He also has a history of systemic embolization and has had surgery on the right leg for an embolectomy a short time ago.  He continually complains of numbness in the right leg.  I suspect this is probable nerve damage related to this.    He has a history of chronic systolic heart failure with an EF of 35%, which is felt to be non-ischemic, ?tachycardic.  He is also status post pacemaker insertion.    CURRENT MEDICATIONS:  Medication list is reviewed.             Apixaban 5 mg p.o. b.i.d.      ASA 81 p.o. q.d.      Wellbutrin 75 p.o. b.i.d.       Digoxin 0.0625 p.o. daily.     Diltiazem 120 p.o. q.daily.      Melatonin 3 mg at bedtime.  I believe I changed this to Restoril.    Metoprolol XL 200 mg daily.      Omeprazole 40 q.d.      Pravastatin/Crestor 20 mg p.o. q.d.       Senna-S 8.6/50, 2 p.o. b.i.d.      Oxycodone 10 mg/325, 1 tablet q.4 h p.r.n.       Spiriva 18 mcg, 1 capsule daily.     Albuterol nebulizers p.r.n.      LABORATORY DATA:   Lab work from 11/28/2014:      White count 7.1, hemoglobin 12.9, platelet count 264.    Sodium 146, potassium 4.7, CO2 of 20, BUN 27, creatinine 1.22.    Dig level 0.5.      REVIEW OF SYSTEMS:    CHEST/RESPIRATORY:  He is not complaining of shortness of breath.   CARDIAC:  No chest pain or palpitations.   GI:  No abdominal pain.     NEUROLOGICAL:  Extremities:  Still complaining of numbness in the right leg below the knee.    PHYSICAL EXAMINATION:     VITAL SIGNS:     TEMPERATURE:  97.1.      PULSE:  82, and I think is still clearly atrial fibrillation.   RESPIRATIONS:  16.      BLOOD PRESSURE:  140/86.     02 SATURATIONS:  99% on room air.    GENERAL APPEARANCE:  The patient is not in any distress.    Lying comfortably flat in bed.   CHEST/RESPIRATORY:  Shallow, but otherwise clear air entry.        CARDIOVASCULAR:   CARDIAC:  AFib.  There are no murmurs.   He appears to be euvolemic.     ARTERIAL:  I cannot feel the pulses in his right foot, dorsalis pedis and posterior tibial on the right.  He does have a brisk popliteal pulse  on the right.  His pulses are equally palpable on the left.   NEUROLOGICAL:    SENSATION/STRENGTH:  Extremities:  He has loss of sensation in the right distal leg.   BALANCE/GAIT:  He is able to bring himself up to a standing position.  He will be going home with a wheeled walker.  He is somewhat unsteady.    ASSESSMENT/PLAN:                    Recurrent atrial fibrillation with rapid ventricular response.  He is currently stable with a pulse rate in the 70s.  This still feels like AFib with an irregular irregular pulse, but his rate is controlled.    Non-ischemic cardiomyopathy.  There is no evidence of heart failure on exam.  He is not on diuretics.  I do not think he needs any.     Probable COPD.   This appears to be stable at the bedside.    History of depression.  I think this is stable, as well.   Loss of sensation in his right distal leg.  I am assuming that this was an acute ischemic injury.  He still has some gait instability, although he seems to be doing well with his walker.  We will have him have PT at home.

## 2014-12-12 ENCOUNTER — Encounter: Payer: Self-pay | Admitting: Internal Medicine

## 2014-12-12 ENCOUNTER — Ambulatory Visit (INDEPENDENT_AMBULATORY_CARE_PROVIDER_SITE_OTHER): Payer: Medicare HMO | Admitting: Internal Medicine

## 2014-12-12 VITALS — BP 114/77 | HR 63 | Temp 97.6°F | Ht 75.0 in | Wt 152.0 lb

## 2014-12-12 DIAGNOSIS — I4811 Longstanding persistent atrial fibrillation: Secondary | ICD-10-CM

## 2014-12-12 DIAGNOSIS — I481 Persistent atrial fibrillation: Secondary | ICD-10-CM

## 2014-12-12 DIAGNOSIS — I10 Essential (primary) hypertension: Secondary | ICD-10-CM | POA: Diagnosis not present

## 2014-12-12 DIAGNOSIS — Z09 Encounter for follow-up examination after completed treatment for conditions other than malignant neoplasm: Secondary | ICD-10-CM

## 2014-12-12 DIAGNOSIS — N390 Urinary tract infection, site not specified: Secondary | ICD-10-CM

## 2014-12-12 DIAGNOSIS — Z7901 Long term (current) use of anticoagulants: Secondary | ICD-10-CM

## 2014-12-12 DIAGNOSIS — Z96643 Presence of artificial hip joint, bilateral: Secondary | ICD-10-CM

## 2014-12-12 DIAGNOSIS — Z8619 Personal history of other infectious and parasitic diseases: Secondary | ICD-10-CM

## 2014-12-12 DIAGNOSIS — Z0000 Encounter for general adult medical examination without abnormal findings: Secondary | ICD-10-CM

## 2014-12-12 DIAGNOSIS — M545 Low back pain: Secondary | ICD-10-CM

## 2014-12-12 DIAGNOSIS — M150 Primary generalized (osteo)arthritis: Secondary | ICD-10-CM

## 2014-12-12 DIAGNOSIS — Z87440 Personal history of urinary (tract) infections: Secondary | ICD-10-CM

## 2014-12-12 DIAGNOSIS — A419 Sepsis, unspecified organism: Secondary | ICD-10-CM

## 2014-12-12 DIAGNOSIS — G8929 Other chronic pain: Secondary | ICD-10-CM

## 2014-12-12 MED ORDER — OXYCODONE-ACETAMINOPHEN 10-325 MG PO TABS: 1.0000 | ORAL_TABLET | ORAL | Status: DC | PRN

## 2014-12-12 NOTE — Progress Notes (Signed)
Patient ID: [name], male   DOB: 11-17-1945, 69 y.o.   MRN: [id number]    Subjective:   Patient ID: [name] male   DOB: 1945/08/22 69 y.o.   MRN: [id number]  HPI: [name] is a 69 y.o. pleasant man with past medical history of pacemaker, chronic AF on eliquis, hypertension, hyperlipidemia, CKD Stage 3, chronic systolic CHF (EF 38-88%), MI, left LE ischemia, right femoral embolectomy, portal vein/splenic/SMV/IVC thrombosis, DVT, COPD, bipolar disorder, TIA,  b/l hip replacement, insomnia, chronic constipation, GERD, and OSA who presents for hospital follow-up visit after recent SNF discharge.  He was hospitalized from 6/22-30 for afib with RVR with controlled heart rate with toprol-XL 200 mg daily, cardizem CD 120 mg daily, and digoxin 0. 0625 mg daily which he was instructed to continue on discharge. His coumadin was changed to eliquis 5 mg BID which he reports they were giving once daily at the SNF. He is also on aspirin 81 mg daily due to recent MI in May. He denies GI or GU bleeding.  He denies dyspnea on exertion worse than baseline, dizziness worse from baseline, weakness from baseline, chest pain, palpitations, LE edema, or syncope. He was seen by his cardiologist Dr Rayann Heman on 7/6 with no medication changes and instructed to follow-up in 3 months.   He has E coli sepsis in setting of UTI that resolved with IV and PO antibiotics. He completed 10-day course of PO ciprofloxacin for 10-day course at the SNF. He denies urinary symptoms.   He has chronic pain in setting of bilateral hip replacement and osteoarthritis of multiple joints. He is on a monthly pain contract for percocet 10-325 mg Q 4 hr as needed for pain. He uses senokot-S as needed for constipation.      Past Medical History  Diagnosis Date  . Hypertension   . Chronic systolic heart failure     a. NICM - EF 35% with normal cors 2003. b. Last echo 11/2012 - EF 25-30%.  . Depression   . GERD (gastroesophageal  reflux disease)   . Portal vein thrombosis   . Avascular necrosis of bones of both hips     Status post bilateral hip replacements  . Gastritis   . Alcohol abuse   . Erectile dysfunction   . Bipolar disorder   . Hydrocele, unspecified   . Lung nodule     Chest CT scan on 12/14/2008 showed a nodular opacity at the left lung base felt to most likely represent scarring.  Follow-up chest CT scan on 06/02/2010 showed parenchymal scarring in the left apex, left  lower lobe, and lingula; some of this scarring at the left base had a nodular appearance, unchanged. Chest 09/2012 - stable.  . 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  Thyroid scan 08/28/2009 was normal with no focal areas of abnormal increased or decreased activity seen; the uptake of I 131 sodium iodide at 24 hours was 5.7%.  TSH and free T4 normalized by 08/16/2009.  . Intestinal obstruction   . COPD (chronic obstructive pulmonary disease)   . E coli bacteremia   . DVT (deep venous thrombosis)     He has hypercoagulability with multiple prior DVTs  . Pleural plaque     H/o asbestos exposure. Chest CT on 06/02/2010 showed stable extensive calcified pleural plaques involving the left hemithorax, consistent with asbestos related pleural disease.  . Weight loss     Normal colonoscopy by Dr.  Brodie on 11/06/2010.  . Tachycardia-bradycardia syndrome     a. 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 b. [employer] gen change 2010.  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  . Noncompliance   . Thrombocytopenia   . Osteoarthritis   . Spondylosis   . Anxiety   . CKD (chronic kidney disease)     Renal U/S 12/04/2009 showed no pathological findings. Labs 12/04/2009 include normal ESR, C3, C4; neg ANA; SPEP showed nonspecific increase in the alpha-2 region with no M-spike; UPEP showed no monoclonal free light  chains; urine IFE showed polyclonal increase in feree Kappa and/or free Lambda light chains. Baseline Cr reported 1.7-2.5.  . STEMI (ST elevation myocardial infarction) 10/11/2014  . Hyperlipemia    Current Outpatient Prescriptions  Medication Sig Dispense Refill  . albuterol (VENTOLIN HFA) 108 (90 BASE) MCG/ACT inhaler Inhale 2 puffs into the lungs every 6 (six) hours as needed for wheezing or shortness of breath. 1 Inhaler 3  . apixaban (ELIQUIS) 5 MG TABS tablet Take 1 tablet (5 mg total) by mouth 2 (two) times daily. 60 tablet 3  . aspirin 81 MG chewable tablet Chew 1 tablet (81 mg total) by mouth daily. 30 tablet 12  . bisacodyl (DULCOLAX) 10 MG suppository Place 1 suppository (10 mg total) rectally daily as needed for severe constipation. 12 suppository 0  . buPROPion (WELLBUTRIN) 75 MG tablet Take 1 tablet (75 mg total) by mouth 2 (two) times daily. 60 tablet 5  . digoxin 62.5 MCG TABS Take 0.0625 mg by mouth daily. 30 tablet 2  . diltiazem (CARDIZEM CD) 120 MG 24 hr capsule Take 1 capsule (120 mg total) by mouth daily. 30 capsule 3  . diphenhydrAMINE (BENADRYL) 2 % cream Apply 1 application topically 3 (three) times daily as needed for itching.    . feeding supplement, ENSURE ENLIVE, (ENSURE ENLIVE) LIQD Take 237 mLs by mouth 2 (two) times daily between meals. 237 mL 12  . metoprolol (TOPROL-XL) 200 MG 24 hr tablet Take 1 tablet (200 mg total) by mouth daily. Take with or immediately following a meal. 30 tablet 11  . omeprazole (PRILOSEC) 40 MG capsule Take 40 mg by mouth daily.    [name] oxyCODONE-acetaminophen (PERCOCET) 10-325 MG per tablet Take 1 tablet by mouth every 4 (four) hours as needed for pain. Do not take more than 5 doses in a 24 hour period. 120 tablet 0  . rosuvastatin (CRESTOR) 20 MG tablet Take 1 tablet (20 mg total) by mouth daily at 6 PM. 30 tablet 2  . senna-docusate ([employer] SENNA PLUS) 8.6-50 MG per tablet Take 2 tablets by mouth 2 (two) times daily. 120 tablet 3  .  tiotropium (SPIRIVA) 18 MCG inhalation capsule Place 1 capsule (18 mcg total) into inhaler and inhale daily. 30 capsule 5   No current facility-administered medications for this visit.   Family History  Problem Relation Age of Onset  . Hypertension Mother   . Cancer Brother     Unsure of type.  . Asthma Mother   . Coronary artery disease Mother   . Colon cancer Neg Hx   . Lung cancer Neg Hx   . Prostate cancer Neg Hx   . Stroke      mother  . Heart attack      brother and sister ?age    History   Social History  . Marital Status: Widowed  Spouse Name: N/A  . Number of Children: 10  . Years of Education: N/A   Occupational History  . RETIRED    Social History Main Topics  . Smoking status: Former Smoker -- 0.33 packs/day for 54 years    Types: Cigarettes    Quit date: 10/11/2014  . Smokeless tobacco: Never Used  . Alcohol Use: 0.0 oz/week    0 Standard drinks or equivalent per week     Comment: 6/22//2016 "h/o etoh abuse; stopped drinking ~ 2012"  . Drug Use: 1.00 per week    Special: Marijuana     Comment: 11/09/2014 "none since 10/11/2014; did smoke marijuana ~ 1 time/month"  . Sexual Activity: Not Currently   Other Topics Concern  . Not on file   Social History Narrative   Works as a Architect part time   Smoker 1 pack last 1.5 days tobacco, smokes marijuana    Denies EtOH x 4 years (on 10/12/12)   9 kids    From [place]   [place] Veteran    12 grade education             Review of Systems: Review of Systems  Constitutional: Positive for malaise/fatigue (chronic). Negative for fever, chills and weight loss.  Eyes: Negative for blurred vision.  Respiratory: Positive for shortness of breath (DOE < 1block). Negative for cough and wheezing.   Cardiovascular: Negative for chest pain, palpitations and leg swelling.  Gastrointestinal: Positive for constipation (chronic in setting of opiod use). Negative for nausea, vomiting, abdominal pain, diarrhea,  blood in stool and melena.  Genitourinary: Negative for dysuria, urgency, frequency and hematuria.  Musculoskeletal: Positive for joint pain (chronic b/l hip pain ). Negative for myalgias.  Neurological: Positive for dizziness (chronic) and weakness (chronic). Negative for focal weakness and headaches.  Psychiatric/Behavioral: Negative for depression (controlled on medical therapy).    Objective:  Physical Exam: Filed Vitals:   12/12/14 1551  BP: 114/77  Pulse: 63  Temp: 97.6 F (36.4 C)  TempSrc: Oral  Height: _0  (1.905 m)  Weight: 152 lb (68.947 kg)  SpO2: 99%    Physical Exam  Constitutional: He is oriented to person, place, and time. He appears well-developed and well-nourished. No distress.  HENT:  Head: Normocephalic and atraumatic.  Right Ear: External ear normal.  Left Ear: External ear normal.  Nose: Nose normal.  Mouth/Throat: Oropharynx is clear and moist. No oropharyngeal exudate.  Eyes: Conjunctivae and EOM are normal. Pupils are equal, round, and reactive to light. Right eye exhibits no discharge. Left eye exhibits no discharge. No scleral icterus.  Neck: Normal range of motion. Neck supple.  Cardiovascular: Normal rate.   No murmur heard. Irregularly irregular rhythm   Pulmonary/Chest: Effort normal. No respiratory distress. He has no wheezes. He has no rales.  Decreased BS at bases  Abdominal: Soft. Bowel sounds are normal. He exhibits no distension. There is no tenderness. There is no rebound and no guarding.  Musculoskeletal: Normal range of motion. He exhibits no edema or tenderness.  Neurological: He is alert and oriented to person, place, and time.  Skin: Skin is warm and dry. No rash noted. He is not diaphoretic. No erythema. No pallor.  Psychiatric: He has a normal mood and affect. His behavior is normal. Judgment and thought content normal.    Assessment & Plan:   Please see problem list for problem-based assessment and plan

## 2014-12-12 NOTE — Patient Instructions (Signed)
-  Make sure you take eliquis 5 mg twice a day -Keep taking all your medications -I refilled your pain medication  -So glad you are doing well please come back in 6-8 weeks for check-up, will check your labs then  General Instructions:   Thank you for bringing your medicines today. This helps [place] keep you safe from mistakes.   Progress Toward Treatment Goals:  Treatment Goal 08/10/2014  Blood pressure at goal  Stop smoking -  Prevent falls unchanged    Self Care Goals & Plans:  Self Care Goal 12/12/2014  Manage my medications take my medicines as prescribed; bring my medications to every visit; refill my medications on time; follow the sick day instructions if I am sick  Monitor my health keep track of my blood pressure; keep track of my weight  Eat healthy foods eat more vegetables; eat fruit for snacks and desserts; eat baked foods instead of fried foods  Be physically active find an activity I enjoy  Meeting treatment goals -    No flowsheet data found.   Care Management & Community Referrals:  Referral 08/10/2014  Referrals made for care management support nutritionist  Referrals made to community resources none

## 2014-12-12 NOTE — Patient Outreach (Signed)
[facility]) Care Management  12/12/2014  [name] 10-Jul-1945 [id number]   Notification from [name], LCSW to close case due to goals met with [facility].  [name]. Story, [facility] [facility] Management [place] Assistant Phone: [phone] Fax: [phone]

## 2014-12-13 NOTE — Assessment & Plan Note (Addendum)
Assessment: Pt with recent hospitalization for sepsis due to E. Coli UTI s/p completion of antibiotic therapy who presents with no urinary symptoms.   Plan:  -Monitor for recurrence

## 2014-12-13 NOTE — Assessment & Plan Note (Signed)
Assessment: Pt with chronic low back pain, OA of multiple joints, and b/l hip replacement who presents with well-controlled pain on narcotic therapy.   Plan: -Refill percocet 10-325 mg Q 4 hr PRN pain #150 per monthly pain contract for 1 prescription to last until 01/12/15

## 2014-12-13 NOTE — Progress Notes (Signed)
Pt aware of appt with Dr Early 01/03/15 2:15PM and Dr Rayann Heman  03/01/15 Phoebe Sharps Kimley Apsey RN 12/13/14 8:45PM

## 2014-12-13 NOTE — Assessment & Plan Note (Addendum)
Assessment: Pt with well-controlled hypertension compliant with two-class (BB & CCB) anti-hypertensive therapy who presents with blood pressure of 114/77.    Plan:  -BP 114/77 at goal <140/90 -Continue toprol-XL 200 mg daily and cardizem CD 120 mg daily -Last BMP on 11/16/14 with stable CKD Stage 3 with Cr 1.63 and GFR 48

## 2014-12-13 NOTE — Assessment & Plan Note (Signed)
-  Obtain 25-OH vitamin D level in setting of CKD Stage 3 at next visit  -Inquire about zoster vaccination at next visit

## 2014-12-13 NOTE — Assessment & Plan Note (Addendum)
Assessment: Pt with long-standing persistent afib compliant with medical therapy who presents with well-controlled rate of 63.    Plan:  -Continue eliquis 5 mg BID and aspirin 81 mg daily (in setting of recent MI) for [facility] therapy  -Continue digoxin 0.0625 mg daily, toprol-XL 200 mg daily, and cardizem CD 120 mg daily for rate control -Pt has follow-up appt on October 12 with Dr. Rayann Heman

## 2014-12-16 NOTE — Progress Notes (Signed)
Internal Medicine Clinic Attending  Case discussed with Dr. Rabbani soon after the resident saw the patient.  We reviewed the resident's history and exam and pertinent patient test results.  I agree with the assessment, diagnosis, and plan of care documented in the resident's note.  

## 2014-12-20 DIAGNOSIS — J449 Chronic obstructive pulmonary disease, unspecified: Secondary | ICD-10-CM | POA: Diagnosis not present

## 2014-12-20 DIAGNOSIS — J129 Viral pneumonia, unspecified: Secondary | ICD-10-CM | POA: Diagnosis not present

## 2014-12-20 DIAGNOSIS — I4891 Unspecified atrial fibrillation: Secondary | ICD-10-CM | POA: Diagnosis not present

## 2014-12-20 DIAGNOSIS — N19 Unspecified kidney failure: Secondary | ICD-10-CM | POA: Diagnosis not present

## 2014-12-21 ENCOUNTER — Ambulatory Visit: Payer: Medicare HMO | Admitting: Internal Medicine

## 2014-12-21 ENCOUNTER — Encounter: Payer: Commercial Managed Care - HMO | Admitting: Internal Medicine

## 2014-12-31 ENCOUNTER — Other Ambulatory Visit: Payer: Self-pay | Admitting: Internal Medicine

## 2015-01-02 ENCOUNTER — Encounter: Payer: Self-pay | Admitting: Vascular Surgery

## 2015-01-02 ENCOUNTER — Other Ambulatory Visit: Payer: Self-pay | Admitting: Internal Medicine

## 2015-01-02 NOTE — Telephone Encounter (Signed)
Tried to call pt, no answer 

## 2015-01-02 NOTE — Telephone Encounter (Signed)
[facility] [place]. Pt needs several medications.

## 2015-01-03 ENCOUNTER — Ambulatory Visit: Payer: Medicare HMO | Admitting: Vascular Surgery

## 2015-01-04 ENCOUNTER — Other Ambulatory Visit: Payer: Self-pay | Admitting: Pharmacist

## 2015-01-04 ENCOUNTER — Other Ambulatory Visit: Payer: Self-pay | Admitting: *Deleted

## 2015-01-04 NOTE — Telephone Encounter (Signed)
Patient call in concerned about getting his medications refilled and would like a call back.

## 2015-01-04 NOTE — Patient Outreach (Signed)
[facility]) Care Management  [place]   01/04/2015  [name] 12/14/1945 [id number]  Subjective: [name] is a 69 y.o. male who was referred to [place] for medication management. He was interested in [place] because he sometimes forgets to pick up his medications on time.  Patient was provided with the resources to set up mail order.    Objective:   Current Medications: Current Outpatient Prescriptions  Medication Sig Dispense Refill  . albuterol (VENTOLIN HFA) 108 (90 BASE) MCG/ACT inhaler Inhale 2 puffs into the lungs every 6 (six) hours as needed for wheezing or shortness of breath. 1 Inhaler 3  . apixaban (ELIQUIS) 5 MG TABS tablet Take 1 tablet (5 mg total) by mouth 2 (two) times daily. 60 tablet 3  . aspirin 81 MG chewable tablet Chew 1 tablet (81 mg total) by mouth daily. 30 tablet 12  . bisacodyl (DULCOLAX) 10 MG suppository Place 1 suppository (10 mg total) rectally daily as needed for severe constipation. 12 suppository 0  . buPROPion (WELLBUTRIN) 75 MG tablet Take 1 tablet (75 mg total) by mouth 2 (two) times daily. 60 tablet 5  . digoxin 62.5 MCG TABS Take 0.0625 mg by mouth daily. 30 tablet 2  . diltiazem (CARDIZEM CD) 120 MG 24 hr capsule Take 1 capsule (120 mg total) by mouth daily. 30 capsule 3  . diphenhydrAMINE (BENADRYL) 2 % cream Apply 1 application topically 3 (three) times daily as needed for itching.    . feeding supplement, ENSURE ENLIVE, (ENSURE ENLIVE) LIQD Take 237 mLs by mouth 2 (two) times daily between meals. 237 mL 12  . metoprolol (TOPROL-XL) 200 MG 24 hr tablet Take 1 tablet (200 mg total) by mouth daily. Take with or immediately following a meal. 30 tablet 11  . omeprazole (PRILOSEC) 40 MG capsule Take 40 mg by mouth daily.    [name] oxyCODONE-acetaminophen (PERCOCET) 10-325 MG per tablet Take 1 tablet by mouth every 4 (four) hours as needed for pain. Do not take more than 5 doses in a 24 hour  period. 150 tablet 0  . rosuvastatin (CRESTOR) 20 MG tablet Take 1 tablet (20 mg total) by mouth daily at 6 PM. 30 tablet 2  . senna-docusate ([facility] SENNA PLUS) 8.6-50 MG per tablet Take 2 tablets by mouth 2 (two) times daily. 120 tablet 3  . tiotropium (SPIRIVA) 18 MCG inhalation capsule Place 1 capsule (18 mcg total) into inhaler and inhale daily. 30 capsule 5   No current facility-administered medications for this visit.    Functional Status: In your present state of health, do you have any difficulty performing the following activities: 12/12/2014 11/23/2014  Hearing? N N  Vision? N Y  Difficulty concentrating or making decisions? N Y  Walking or climbing stairs? Y Y  Dressing or bathing? N Y  Doing errands, shopping? [name]  Preparing Food and eating ? - Y  Using the Toilet? - N  In the past six months, have you accidently leaked urine? - N  Do you have problems with loss of bowel control? - N  Managing your Medications? - Y  Managing your Finances? - Y  Housekeeping or managing your Housekeeping? - Y    Fall/Depression Screening: PHQ 2/9 Scores 12/12/2014 12/06/2014 11/23/2014 11/09/2014 10/24/2014 08/10/2014 07/28/2014  PHQ - 2 Score 0 0 1 2 0 0 0  PHQ- 9 Score - 5 8 14  - - -  Exception Documentation - Patient refusal  Patient refusal - - - -    Assessment: 1. Mail order: patient has [employer] and has access to mail order pharmacy but [facility]   Plan: 1. Mail order: no further pharmacy needs. Will close out of the pharmacy program as patient had refused further pharmacy services.   Nicoletta Ba, PharmD, [facility] [phone]

## 2015-01-04 NOTE — Telephone Encounter (Signed)
Pt did not know what he needed, called pharm, sent all refills for approval

## 2015-01-05 NOTE — Telephone Encounter (Signed)
Refills requested.

## 2015-01-06 MED ORDER — SENNOSIDES-DOCUSATE SODIUM 8.6-50 MG PO TABS: 2.0000 | ORAL_TABLET | Freq: Two times a day (BID) | ORAL | Status: DC

## 2015-01-06 MED ORDER — ROSUVASTATIN CALCIUM 20 MG PO TABS: 20.0000 mg | ORAL_TABLET | Freq: Every day | ORAL | Status: DC

## 2015-01-06 MED ORDER — DIGOXIN 125 MCG PO TABS: 0.0625 mg | ORAL_TABLET | Freq: Every day | ORAL | Status: DC

## 2015-01-06 MED ORDER — DILTIAZEM HCL ER COATED BEADS 120 MG PO CP24: 120.0000 mg | ORAL_CAPSULE | Freq: Every day | ORAL | Status: DC

## 2015-01-06 MED ORDER — OMEPRAZOLE 40 MG PO CPDR: 40.0000 mg | DELAYED_RELEASE_CAPSULE | Freq: Every day | ORAL | Status: DC

## 2015-01-06 MED ORDER — ALBUTEROL SULFATE HFA 108 (90 BASE) MCG/ACT IN AERS: 2.0000 | INHALATION_SPRAY | Freq: Four times a day (QID) | RESPIRATORY_TRACT | Status: DC | PRN

## 2015-01-06 MED ORDER — BUPROPION HCL 75 MG PO TABS: 75.0000 mg | ORAL_TABLET | Freq: Two times a day (BID) | ORAL | Status: DC

## 2015-01-06 MED ORDER — BISACODYL 10 MG RE SUPP: 10.0000 mg | Freq: Every day | RECTAL | Status: DC | PRN

## 2015-01-06 MED ORDER — TIOTROPIUM BROMIDE MONOHYDRATE 18 MCG IN CAPS: 18.0000 ug | ORAL_CAPSULE | Freq: Every day | RESPIRATORY_TRACT | Status: DC

## 2015-01-06 MED ORDER — APIXABAN 5 MG PO TABS: 5.0000 mg | ORAL_TABLET | Freq: Two times a day (BID) | ORAL | Status: DC

## 2015-01-09 ENCOUNTER — Other Ambulatory Visit: Payer: Self-pay | Admitting: Internal Medicine

## 2015-01-09 DIAGNOSIS — G8929 Other chronic pain: Secondary | ICD-10-CM

## 2015-01-09 NOTE — Telephone Encounter (Signed)
Last refill 7/25 Last OV 7/25 Last UDS 12/2013  Pt has a Rx in file, can get on 8/25 Pt informed

## 2015-01-09 NOTE — Telephone Encounter (Signed)
Patient requesting pain medication refill

## 2015-01-17 ENCOUNTER — Ambulatory Visit: Payer: Commercial Managed Care - HMO | Admitting: Vascular Surgery

## 2015-01-20 DIAGNOSIS — J129 Viral pneumonia, unspecified: Secondary | ICD-10-CM | POA: Diagnosis not present

## 2015-01-20 DIAGNOSIS — I4891 Unspecified atrial fibrillation: Secondary | ICD-10-CM | POA: Diagnosis not present

## 2015-01-20 DIAGNOSIS — N19 Unspecified kidney failure: Secondary | ICD-10-CM | POA: Diagnosis not present

## 2015-01-20 DIAGNOSIS — J449 Chronic obstructive pulmonary disease, unspecified: Secondary | ICD-10-CM | POA: Diagnosis not present

## 2015-01-28 ENCOUNTER — Other Ambulatory Visit: Payer: Self-pay | Admitting: Internal Medicine

## 2015-02-06 ENCOUNTER — Other Ambulatory Visit: Payer: Self-pay | Admitting: Internal Medicine

## 2015-02-06 DIAGNOSIS — G8929 Other chronic pain: Secondary | ICD-10-CM

## 2015-02-06 NOTE — Telephone Encounter (Signed)
Last filled 8/25 Last UDS  09/2014, please review Last appt 7/25

## 2015-02-06 NOTE — Telephone Encounter (Signed)
Pt called requesting oxycodone to be filled. °

## 2015-02-07 MED ORDER — OXYCODONE-ACETAMINOPHEN 10-325 MG PO TABS: 1.0000 | ORAL_TABLET | ORAL | Status: DC | PRN

## 2015-02-07 NOTE — Telephone Encounter (Signed)
Pt aware Rx is ready. Hilda Blades Gerasimos Plotts RN 02/07/15 9:10AM

## 2015-02-08 ENCOUNTER — Other Ambulatory Visit: Payer: Self-pay | Admitting: Internal Medicine

## 2015-02-19 DIAGNOSIS — J129 Viral pneumonia, unspecified: Secondary | ICD-10-CM | POA: Diagnosis not present

## 2015-02-19 DIAGNOSIS — N19 Unspecified kidney failure: Secondary | ICD-10-CM | POA: Diagnosis not present

## 2015-02-19 DIAGNOSIS — J449 Chronic obstructive pulmonary disease, unspecified: Secondary | ICD-10-CM | POA: Diagnosis not present

## 2015-02-19 DIAGNOSIS — I4891 Unspecified atrial fibrillation: Secondary | ICD-10-CM | POA: Diagnosis not present

## 2015-03-01 ENCOUNTER — Encounter: Payer: Medicare HMO | Admitting: Internal Medicine

## 2015-03-02 ENCOUNTER — Encounter: Payer: Self-pay | Admitting: Internal Medicine

## 2015-03-02 ENCOUNTER — Ambulatory Visit (INDEPENDENT_AMBULATORY_CARE_PROVIDER_SITE_OTHER): Payer: Commercial Managed Care - HMO | Admitting: Internal Medicine

## 2015-03-02 VITALS — BP 164/108 | HR 116 | Temp 97.9°F | Ht 75.0 in | Wt 154.6 lb

## 2015-03-02 DIAGNOSIS — Z7901 Long term (current) use of anticoagulants: Secondary | ICD-10-CM | POA: Diagnosis not present

## 2015-03-02 DIAGNOSIS — I10 Essential (primary) hypertension: Secondary | ICD-10-CM

## 2015-03-02 DIAGNOSIS — Z7982 Long term (current) use of aspirin: Secondary | ICD-10-CM | POA: Diagnosis not present

## 2015-03-02 DIAGNOSIS — Z23 Encounter for immunization: Secondary | ICD-10-CM | POA: Diagnosis not present

## 2015-03-02 DIAGNOSIS — M79673 Pain in unspecified foot: Secondary | ICD-10-CM | POA: Insufficient documentation

## 2015-03-02 DIAGNOSIS — I481 Persistent atrial fibrillation: Secondary | ICD-10-CM

## 2015-03-02 DIAGNOSIS — I4811 Longstanding persistent atrial fibrillation: Secondary | ICD-10-CM

## 2015-03-02 DIAGNOSIS — G8929 Other chronic pain: Secondary | ICD-10-CM

## 2015-03-02 DIAGNOSIS — Z0000 Encounter for general adult medical examination without abnormal findings: Secondary | ICD-10-CM

## 2015-03-02 DIAGNOSIS — M79672 Pain in left foot: Secondary | ICD-10-CM

## 2015-03-02 MED ORDER — OXYCODONE-ACETAMINOPHEN 10-325 MG PO TABS: 1.0000 | ORAL_TABLET | ORAL | Status: DC | PRN

## 2015-03-02 NOTE — Patient Instructions (Signed)
Please try to bring all your medicines next time. This helps [place] take good care of you and stops mistakes from medicines that could hurt you.  I have given you a refill of your chronic pain medication.  Please do not take more than prescribed.  You are not able to fill this prescription until March 09, 2015.  I have sent a referral to podiatry to help address your left heel pain.  They will contact you to schedule an appointment.  Follow up with your cardiologist as soon as possible.  Please come back to see [place] in 3 months.

## 2015-03-02 NOTE — Assessment & Plan Note (Signed)
Flu shot given today

## 2015-03-02 NOTE — Assessment & Plan Note (Signed)
Assessment: Patient has long-standing persistent atrial fibrillation compliant with medical therapy who presents today with HR 116, asymptomatic.  Plan: -continue Eliquis 5mg  BID and ASA 81mg  daily for anti-coagulation -continue digoxin 0.0625mg  daily, metoprolol 200mg  daily, and diltiazem 120mg  daily for rate control -patient was supposed to follow up with Dr. Rayann Heman on October 12 but states they had to reschedule his appt.  Encouraged patient to call and have his appt rescheduled

## 2015-03-02 NOTE — Progress Notes (Signed)
Patient ID: [name], male   DOB: July 29, 1945, 69 y.o.   MRN: [id number]   Subjective:   Patient ID: [name] male   DOB: [birth year] 69 y.o.   MRN: [id number]  HPI: [name] is a 69 y.o. male with past medical history of pacemaker, chronic a-fib, HTN, HLD, CKD stage 3, systolic CHF who presents today for routine follow up of his chronic medical conditions as well as with complaint of left heel pain.  Patient complains of "callus" on left heel that he states has been present for at least 4-5 years.  States over the past 2 months it has become more painful and hurts to walk on and is tender to the touch.  States it is "just hurting all the time" and states he has pain shoot up his leg.  States when he was at rehab earlier in the year they trimmed the callus down and it helped for a little bit.  Denies any swelling, bleeding, or drainage from the site.    Past Medical History  Diagnosis Date  . Hypertension   . Chronic systolic heart failure ([place])     a. NICM - EF 35% with normal cors 2003. b. Last echo 11/2012 - EF 25-30%.  . Depression   . GERD (gastroesophageal reflux disease)   . Portal vein thrombosis   . Avascular necrosis of bones of both hips ([facility])     Status post bilateral hip replacements  . Gastritis   . Alcohol abuse   . Erectile dysfunction   . Bipolar disorder ([place])   . Hydrocele, unspecified   . Lung nodule     Chest CT scan on 12/14/2008 showed a nodular opacity at the left lung base felt to most likely represent scarring.  Follow-up chest CT scan on 06/02/2010 showed parenchymal scarring in the left apex, left  lower lobe, and lingula; some of this scarring at the left base had a nodular appearance, unchanged. Chest 09/2012 - stable.  . 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  Thyroid scan 08/28/2009 was normal with no focal areas of abnormal increased or decreased activity seen; the uptake of I 131 sodium iodide at  24 hours was 5.7%.  TSH and free T4 normalized by 08/16/2009.  . Intestinal obstruction ([place])   . COPD (chronic obstructive pulmonary disease) ([place])   . E coli bacteremia   . DVT (deep venous thrombosis) ([place])     He has hypercoagulability with multiple prior DVTs  . Pleural plaque     H/o asbestos exposure. Chest CT on 06/02/2010 showed stable extensive calcified pleural plaques involving the left hemithorax, consistent with asbestos related pleural disease.  . Weight loss     Normal colonoscopy by Dr. Olevia Perches on 11/06/2010.  . Tachycardia-bradycardia syndrome [facility])     a. 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 b. St. Jude gen change 2010.  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  . Noncompliance   . Thrombocytopenia ([place])   . Osteoarthritis   . Spondylosis   . Anxiety   . CKD (chronic kidney disease)     Renal U/S 12/04/2009 showed no pathological findings. Labs 12/04/2009 include normal ESR, C3, C4; neg ANA; SPEP showed nonspecific increase in the alpha-2 region with no M-spike; UPEP showed no monoclonal free light chains; urine IFE showed polyclonal increase in feree Kappa and/or free  Lambda light chains. Baseline Cr reported 1.7-2.5.  . STEMI (ST elevation myocardial infarction) ([place]) 10/11/2014  . Hyperlipemia    Current Outpatient Prescriptions  Medication Sig Dispense Refill  . albuterol (VENTOLIN HFA) 108 (90 BASE) MCG/ACT inhaler Inhale 2 puffs into the lungs every 6 (six) hours as needed for wheezing or shortness of breath. 3 Inhaler 3  . apixaban (ELIQUIS) 5 MG TABS tablet Take 1 tablet (5 mg total) by mouth 2 (two) times daily. 180 tablet 3  . aspirin 81 MG chewable tablet Chew 1 tablet (81 mg total) by mouth daily. 30 tablet 12  . bisacodyl (DULCOLAX) 10 MG suppository Place 1 suppository (10 mg total) rectally daily as needed for severe constipation. 12 suppository 5  .  buPROPion (WELLBUTRIN) 75 MG tablet Take 1 tablet (75 mg total) by mouth 2 (two) times daily. 180 tablet 3  . digoxin (LANOXIN) 0.125 MG tablet Take 0.5 tablets (0.0625 mg total) by mouth daily. 45 tablet 3  . diltiazem (CARDIZEM CD) 120 MG 24 hr capsule Take 1 capsule (120 mg total) by mouth daily. 90 capsule 3  . diphenhydrAMINE (BENADRYL) 2 % cream Apply 1 application topically 3 (three) times daily as needed for itching.    . feeding supplement, ENSURE ENLIVE, (ENSURE ENLIVE) LIQD Take 237 mLs by mouth 2 (two) times daily between meals. 237 mL 12  . metoprolol (TOPROL-XL) 200 MG 24 hr tablet Take 1 tablet (200 mg total) by mouth daily. Take with or immediately following a meal. 30 tablet 11  . omeprazole (PRILOSEC) 40 MG capsule Take 1 capsule (40 mg total) by mouth daily. 90 capsule 3  . oxyCODONE-acetaminophen (PERCOCET) 10-325 MG tablet Take 1 tablet by mouth every 4 (four) hours as needed for pain. Do not take more than 5 doses in a 24 hour period. 150 tablet 0  . rosuvastatin (CRESTOR) 20 MG tablet Take 1 tablet (20 mg total) by mouth daily at 6 PM. 90 tablet 3  . senna-docusate ([facility] SENNA PLUS) 8.6-50 MG per tablet Take 2 tablets by mouth 2 (two) times daily. 360 tablet 3  . tiotropium (SPIRIVA) 18 MCG inhalation capsule Place 1 capsule (18 mcg total) into inhaler and inhale daily. 90 capsule 3   No current facility-administered medications for this visit.   Family History  Problem Relation Age of Onset  . Hypertension Mother   . Cancer Brother     Unsure of type.  . Asthma Mother   . Coronary artery disease Mother   . Colon cancer Neg Hx   . Lung cancer Neg Hx   . Prostate cancer Neg Hx   . Stroke      mother  . Heart attack      brother and sister ?age    Social History   Social History  . Marital Status: Widowed    Spouse Name: N/A  . Number of Children: 10  . Years of Education: N/A   Occupational History  . RETIRED    Social History Main Topics  . Smoking  status: Former Smoker -- 0.33 packs/day for 54 years    Types: Cigarettes    Quit date: 10/11/2014  . Smokeless tobacco: Never Used  . Alcohol Use: No     Comment: 6/22//2016 "h/o etoh abuse; stopped drinking ~ 2012"  . Drug Use: No     Comment: 11/09/2014 "none since 10/11/2014; did smoke marijuana ~ 1 time/month"  . Sexual Activity: Not Currently   Other Topics Concern  . None  Social History Narrative   Works as a Architect part time   Smoker 1 pack last 1.5 days tobacco, smokes marijuana    Denies EtOH x 4 years (on 10/12/12)   9 kids    From [place]   [place] Veteran    12 grade education             Review of Systems: Review of Systems  Constitutional: Negative for fever and chills.  HENT: Negative for congestion and sore throat.   Eyes: Negative for pain.  Respiratory: Negative for shortness of breath.   Cardiovascular: Negative for chest pain, palpitations and leg swelling.  Gastrointestinal: Negative for nausea, vomiting and abdominal pain.  Genitourinary: Negative for dysuria, frequency and difficulty urinating.  Musculoskeletal: Positive for gait problem (due to left heel pain).       Chronic bilateral hip pain.  Neurological: Negative for dizziness and light-headedness.  Psychiatric/Behavioral: Negative for confusion and agitation.    Objective:  Physical Exam: Filed Vitals:   03/02/15 1436  BP: 164/108  Pulse: 116  Temp: 97.9 F (36.6 C)  TempSrc: Oral  Height: _0  (1.905 m)  Weight: 154 lb 9.6 oz (70.126 kg)  SpO2: 100%   Physical Exam  Constitutional: He appears well-developed and well-nourished.  HENT:  Head: Normocephalic and atraumatic.  Eyes: Conjunctivae and EOM are normal.  Neck: Normal range of motion.  Cardiovascular: Intact distal pulses.  An irregularly irregular rhythm present. Tachycardia present.   Respiratory: Effort normal and breath sounds normal.  GI: Soft. Bowel sounds are normal. There is no tenderness.  Skin:    Plantar aspect of left heel w/ subcutaneous nodule approximately 1.5 x 1 cm in size.  No drainage of blood or pus, skin intact.  Tender to palpation.  No surrounding edema or erythema.  Has a cauliflower-like appearance.  Left 4th toe has other painful nodule at the tip.  See attached images.        Assessment & Plan:  Please see Problem List for Assessment and Plan.

## 2015-03-02 NOTE — Assessment & Plan Note (Signed)
Assessment: Patient complains of "callus" on left heel that he states has been present for at least 4-5 years.  States over the past 2 months it has become more painful and hurts to walk on and is tender to the touch.  States it is "just hurting all the time" and states he has pain shoot up his leg.  States when he was at rehab earlier in the year they trimmed the callus down and it helped for a little bit.  Denies any swelling, bleeding, or drainage from the site.  On exam, plantar aspect of left heel w/ subcutaneous nodule approximately 1.5 x 1 cm in size.  No drainage of blood or pus, skin intact.  Tender to palpation.  No surrounding edema or erythema.  Has a cauliflower-like appearance.  Left 4th toe has other painful nodule at the tip.  See images attached to chart.  Plan: -referral to podiatry -given Rx for patients chronic pain medication.  Instructed patient to be careful not to take more than prescribed for pain medication.

## 2015-03-02 NOTE — Assessment & Plan Note (Signed)
BP Readings from Last 3 Encounters:  03/02/15 164/108  12/12/14 114/77  11/23/14 110/70   Assessment:  Patient with HTN compliant with 2 class (CCB and BB) anti-hypertensive therapy who presents today with blood pressure of 164/108.  Denies headaches, chest pain, edema, dizziness or lightheadedness  Blood pressure control: not controlled today.  Normally is well controlled.  Patient attributes elevated BP to left heel pain. Progress towards BP goal: not at goal today  Plan:   -Medication changes: none today as patient currently attributes elevation to pain.  Previous BP readings are typically well controlled. -Labs: none.  BMP at next visit.  Last BMP was 11/16/14 with stable CKD stage 3 with Cr 1.63, GFR 48 -Encourage home BP monitoring 3 times per week or at drug store occasionally  -Encourage regular exercise and healthy diet (decreased salt intake)  Medications after today's visit: 1. Metorprolol 200mg  daily 2. Diltiazem 120mg  daily

## 2015-03-03 NOTE — Addendum Note (Signed)
Addended by: Lalla Brothers T on: 03/03/2015 10:36 AM   Modules accepted: Level of Service

## 2015-03-03 NOTE — Progress Notes (Signed)
Internal Medicine Clinic Attending  I saw and evaluated the patient.  I personally confirmed the key portions of the history and exam documented by Dr. Juleen China and I reviewed pertinent patient test results.  The assessment, diagnosis, and plan were formulated together and I agree with the documentation in the resident's note.  Plantar tender mass with chronic overlying keratinized skin. Unclear if this is a chronic poorly healing heal ulcer related to PAD, callus formation, or possibly very large wart. Given arterial insufficiency, we referred him to podiatry. I think the mass is too large and in a difficult position for wound healing to be excised in our clinic.

## 2015-03-04 IMAGING — CR DG [PERSON_NAME]/EXT/UNI/OR RIGHT
1 series · 1 of 1 positions shown · non-contrast
Comparison: None available

CLINICAL DATA: Ischemic Right Leg

EXAM:
RIGHT OURARI/EXT/UNI/ OR

[AP]
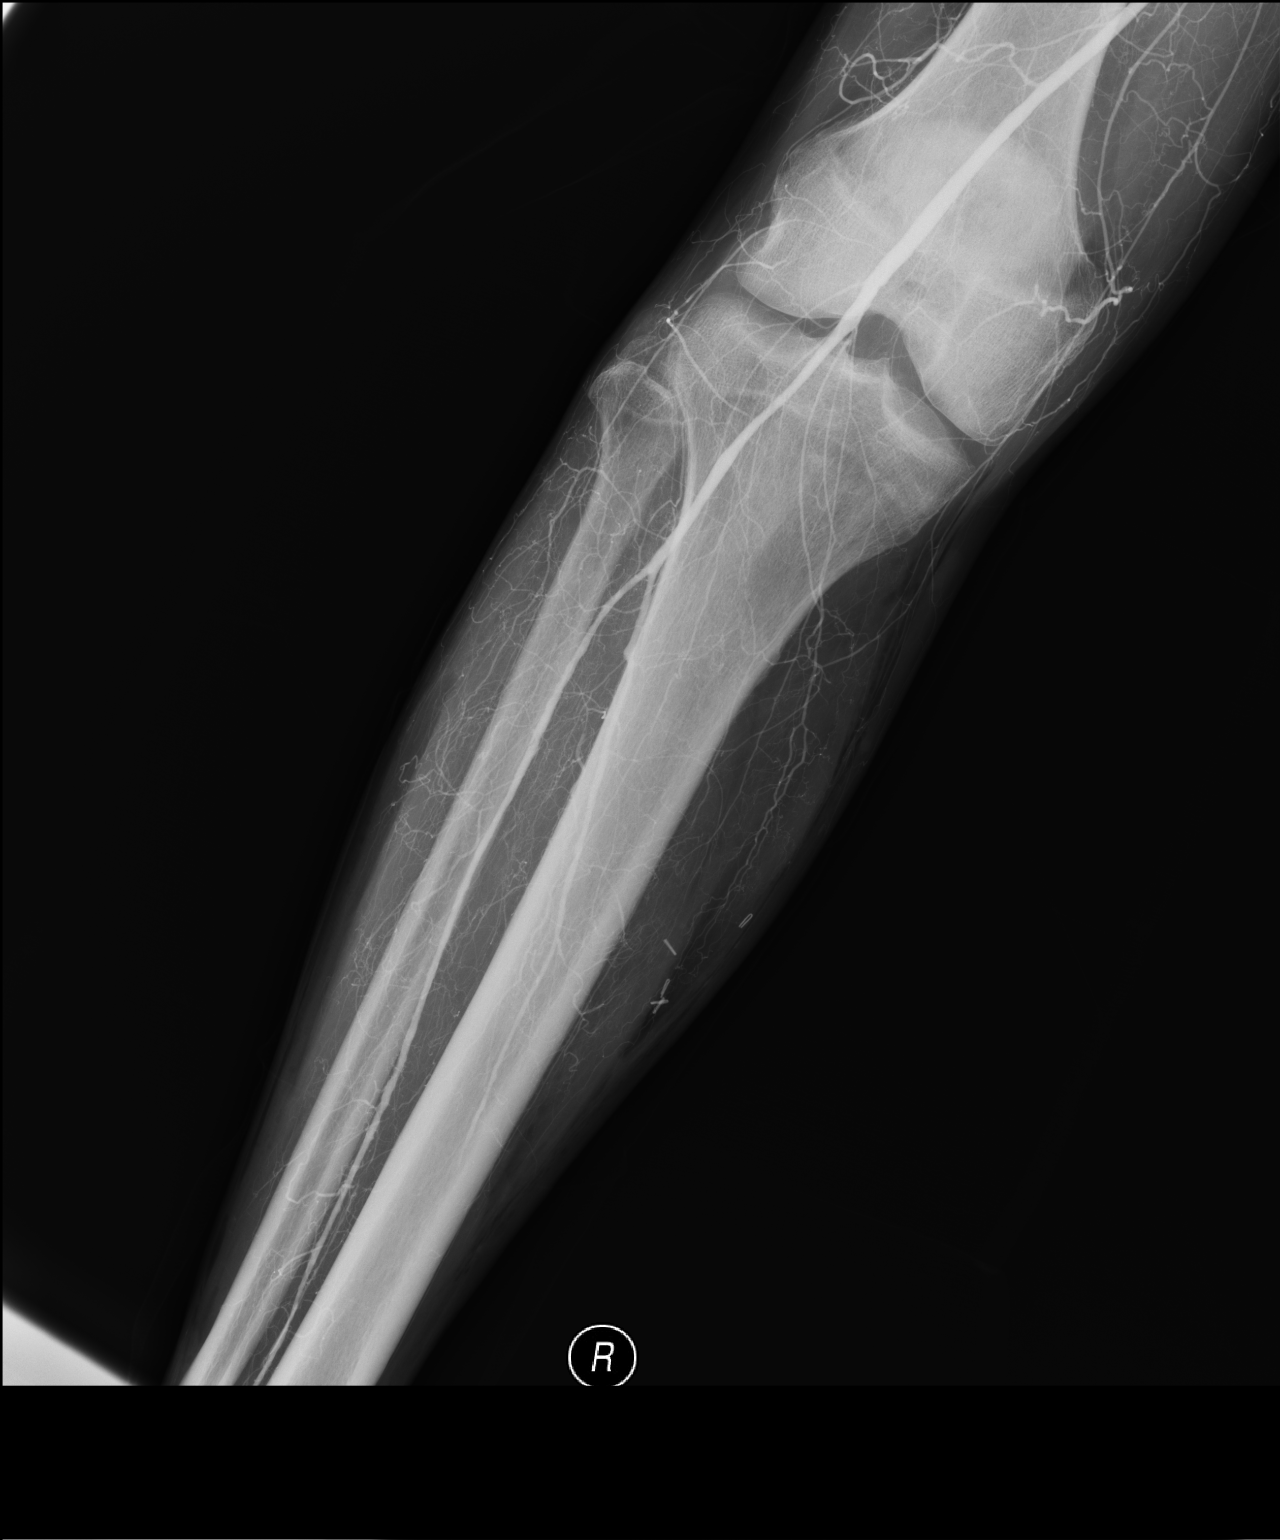

[1 of 1 positions shown; findings below may reference images not displayed]

FINDINGS: Single portable image shows opacification of the popliteal artery
with two-vessel runoff, 1 seen to the lower calf, the other to the
lower margin of the film just above the ankle. Both have mild
atheromatous irregularity.
IMPRESSION: 1. Patent popliteal artery with diseased two-vessel runoff.

## 2015-03-04 IMAGING — CR DG CHEST 2V
1 series · 1 of 1 positions shown · non-contrast
Comparison: Chest CT and chest x-ray 06/02/2013

CLINICAL DATA: Chest pain.  Cough.

EXAM:
CHEST  2 VIEW

[view not recorded]
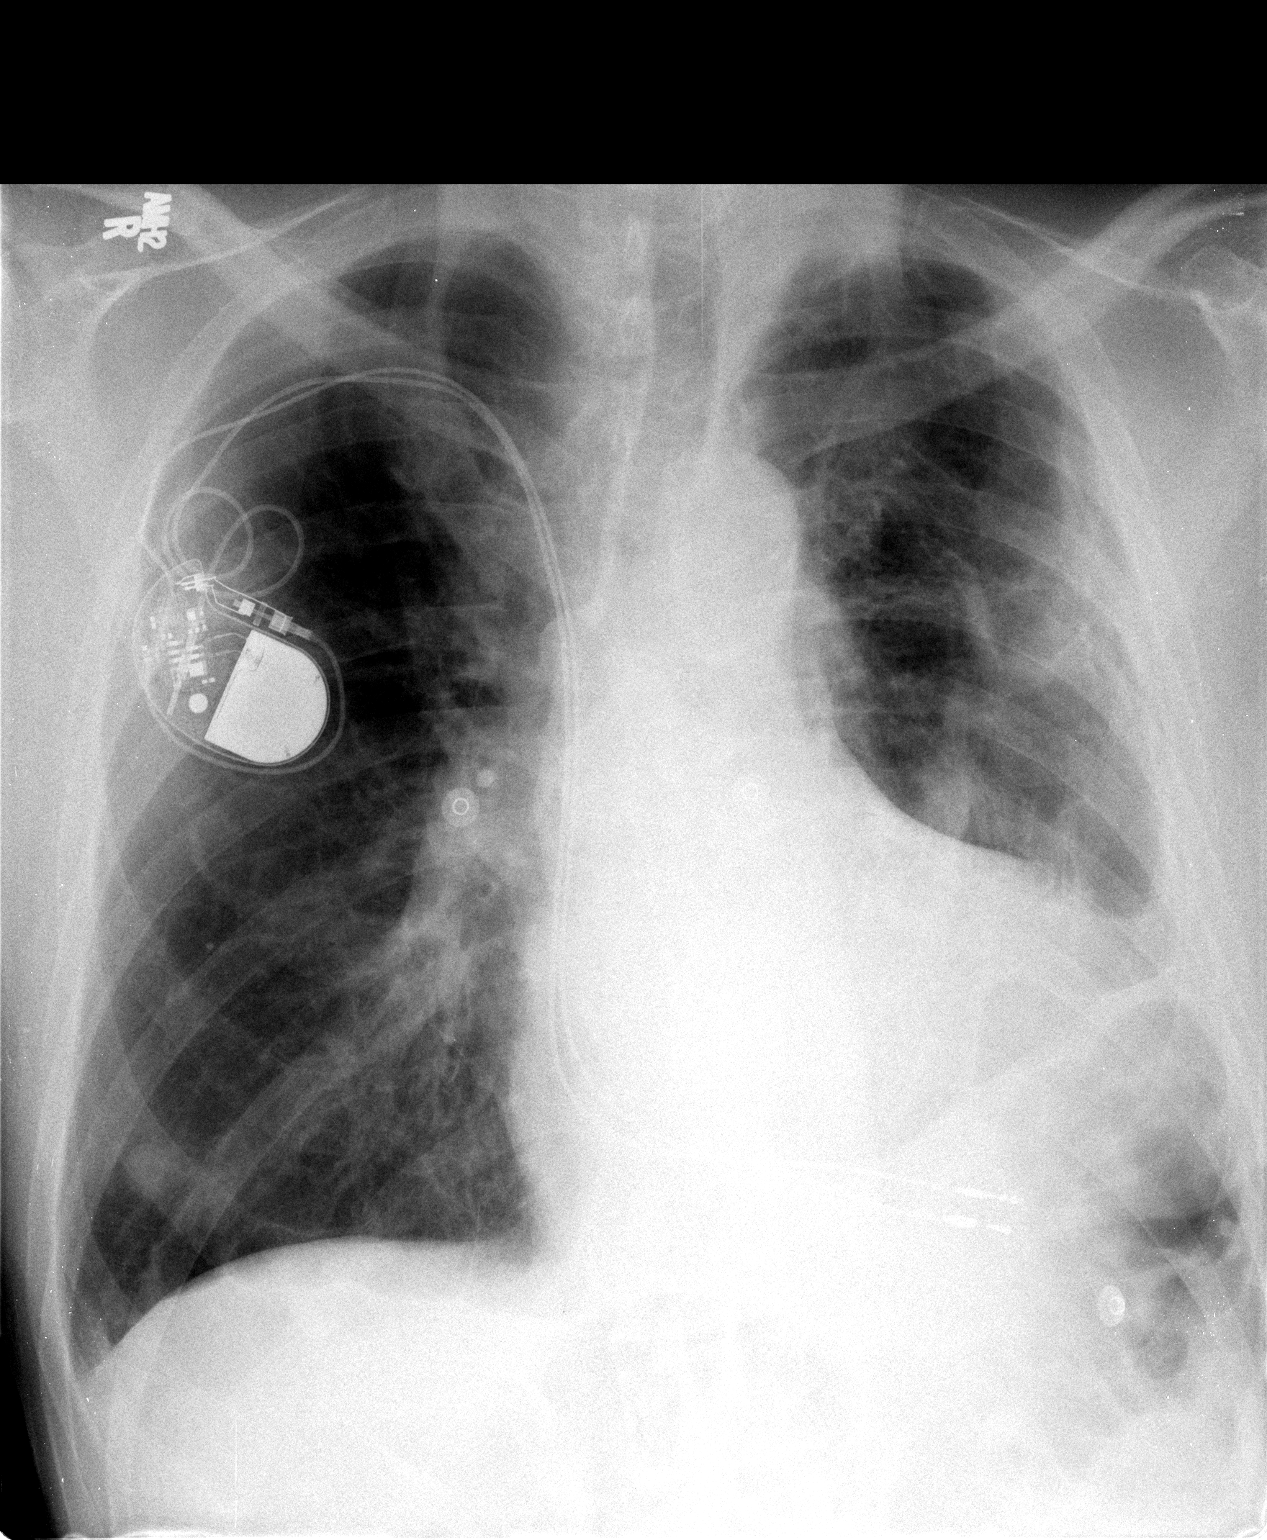

[1 of 1 positions shown; findings below may reference images not displayed]

FINDINGS: The heart is mildly enlarged but stable. The mediastinal and hilar
contours are unchanged. Two right ventricular pacer wires are
unchanged. Severe chronic pleural and parenchymal scarring changes
involving the left lung but no definite acute overlying pulmonary
process.
IMPRESSION: Chronic lung changes without definite acute overlying pulmonary
process.

## 2015-03-07 ENCOUNTER — Encounter: Payer: Commercial Managed Care - HMO | Admitting: Internal Medicine

## 2015-03-13 ENCOUNTER — Encounter: Payer: Self-pay | Admitting: Pharmacist

## 2015-03-13 ENCOUNTER — Telehealth: Payer: Self-pay | Admitting: Pharmacist

## 2015-03-13 NOTE — Telephone Encounter (Addendum)
[name] is a 69 y.o. male who reports to the clinic for monitoring of apixaban (Eliquis) therapy.    ASSESSMENT Indication(s): atrial fibrillation Duration: indefinite Date switched from warfarin: 11/17/14  Labs:    Component Value Date/Time   AST 37 10/26/2014 0700   ALT 48 10/26/2014 0700   NA 140 11/16/2014 0555   K 4.5 11/16/2014 0555   CL 106 11/16/2014 0555   CO2 26 11/16/2014 0555   BUN 29* 11/16/2014 0555   CREATININE 1.63* 11/16/2014 0555   CREATININE 1.33 08/10/2014 1221   CALCIUM 8.4* 11/16/2014 0555   GFRAA 48* 11/16/2014 0555   GFRAA 62 08/10/2014 1221   WBC 9.6 11/16/2014 0555   HGB 12.2* 11/16/2014 0555   HCT 37.4* 11/16/2014 0555   PLT 172 11/16/2014 0555   Apixaban (Eliquis) Dose: 5 mg BID  Pharmacy records indicate 100% adherence to apixaban. Patient reports no signs/symptoms of bleeding or thromboembolism.  RECOMMENDATIONS  Medication: no changes recommended at this time  Monitoring: CBC, CMP at next appointment  PCP appointment in 07/10/15  [place] Pharmacist 03/13/2015, 3:29 PM

## 2015-03-22 DIAGNOSIS — I4891 Unspecified atrial fibrillation: Secondary | ICD-10-CM | POA: Diagnosis not present

## 2015-03-22 DIAGNOSIS — N19 Unspecified kidney failure: Secondary | ICD-10-CM | POA: Diagnosis not present

## 2015-03-22 DIAGNOSIS — J449 Chronic obstructive pulmonary disease, unspecified: Secondary | ICD-10-CM | POA: Diagnosis not present

## 2015-03-22 DIAGNOSIS — J129 Viral pneumonia, unspecified: Secondary | ICD-10-CM | POA: Diagnosis not present

## 2015-03-31 ENCOUNTER — Encounter: Payer: Self-pay | Admitting: Podiatry

## 2015-03-31 ENCOUNTER — Telehealth: Payer: Self-pay | Admitting: *Deleted

## 2015-03-31 ENCOUNTER — Ambulatory Visit (INDEPENDENT_AMBULATORY_CARE_PROVIDER_SITE_OTHER): Payer: Commercial Managed Care - HMO | Admitting: Podiatry

## 2015-03-31 ENCOUNTER — Ambulatory Visit (INDEPENDENT_AMBULATORY_CARE_PROVIDER_SITE_OTHER): Payer: Commercial Managed Care - HMO

## 2015-03-31 VITALS — BP 113/92 | HR 76 | Resp 12

## 2015-03-31 DIAGNOSIS — I739 Peripheral vascular disease, unspecified: Secondary | ICD-10-CM

## 2015-03-31 DIAGNOSIS — Q828 Other specified congenital malformations of skin: Secondary | ICD-10-CM

## 2015-03-31 DIAGNOSIS — R52 Pain, unspecified: Secondary | ICD-10-CM

## 2015-03-31 DIAGNOSIS — G8929 Other chronic pain: Secondary | ICD-10-CM | POA: Diagnosis not present

## 2015-03-31 MED ORDER — UREA 37.5 % EX CREA: 1.0000 "application " | TOPICAL_CREAM | Freq: Every day | CUTANEOUS | Status: DC

## 2015-03-31 MED ORDER — OXYCODONE-ACETAMINOPHEN 10-325 MG PO TABS: 1.0000 | ORAL_TABLET | Freq: Four times a day (QID) | ORAL | Status: DC | PRN

## 2015-03-31 NOTE — Telephone Encounter (Signed)
Pt states the cream Dr. Jacqualyn Posey prescribed is not covered by his insurance, what is he suppose to do?  Dr. Jacqualyn Posey states use any OTC cream to the feet daily. Orders to pt.

## 2015-03-31 NOTE — Progress Notes (Signed)
Subjective:    Patient ID: [name], male    DOB: [birth year], 69 y.o.   MRN: [id number]  HPI 69 year old male presents the office today for concerns of bilateral foot pain. He states that he has a very thick callus the bottom of his left heel is avulsed of the right big toe which has been ongoing since 1968. He also states that he has numbness and burning and stinging pain to both of his legs. He states that this pain has not worsened and his been the same pain that he has been experiencing for quite some time. He is currently taking Percocet and he last had a prescription last month. He denies any redness or drainage from the callus sites. He denies any recent injury or trauma to his feet. He denies ever being on any medication for neuropathy such as gabapentin or Lyrica. No other complaints at this time.  He previously underwent a femoral thrombectomy with vascular surgery on the right side last year.   Review of Systems  Musculoskeletal: Positive for gait problem.  Skin: Positive for color change.       Objective:   Physical Exam General: AAO x3, NAD  Dermatological: There is a very large, thick hyperkeratotic lesion to the plantar aspect of left heel and a small degree the right hallux. After debridement there is no underlying ulceration, drainage or other signs of infection. Nails x 10 are well manicured; remaining integument appears unremarkable at this time. There are no open sores, no preulcerative lesions, no rash or signs of infection present.  Vascular: Dorsalis Pedis artery and Posterior Tibial artery pedal pulses are somewhat decreased but palpable bilaterally with immedate capillary fill time. Pedal hair growth present. No varicosities and no lower extremity edema present bilateral. There is no pain with calf compression, swelling, warmth, erythema.   Neruologic: Grossly intact via light touch bilateral. Vibratory intact via tuning fork bilateral. Protective threshold with  Semmes Wienstein monofilament intact to all pedal sites bilateral. Patellar and Achilles deep tendon reflexes 2+ bilateral. No Babinski or clonus noted bilateral.   Musculoskeletal: There is tenderness palpation overlying the hyperkeratotic lesions. No other areas of tenderness to bilateral lower extremities. No gross boney pedal deformities bilateral. No pain, crepitus, or limitation noted with foot and ankle range of motion bilateral. Muscular strength 5/5 in all groups tested bilateral.  Gait: Unassisted, Nonantalgic.       Assessment & Plan:  69 year old male presents the office today with bilateral foot pain due to thick, large hyperkeratotic lesions; general lower extremity pain -X-rays were obtained and reviewed with the patient.  -Treatment options discussed including all alternatives, risks, and complications -Etiology of symptoms were discussed -Hyperkeratotic lesions are sharply debrided without complications to is much as the patient tolerated. I recommend periodic debridements to get these areas down further. Prescribed urea cream. -I would like to recheck an arterial study to allow any further occlusion given the amount of pain that he is having to his legs. Although, he states the pain has not worsened recently. I ordered arterial duplex. -He may benefit from Lyrica or gabapentin however we'll defer to his primary care physician for this. -He was asking for pain medicine as he was out. I gave him a total of 8 Percocet and he needs to follow-up with his primary care physician on Monday morning. -Follow-up in 9 weeks or sooner if any problems arise. In the meantime, encouraged to call the office with any questions, concerns, change in symptoms.  Celesta Gentile, DPM

## 2015-04-03 ENCOUNTER — Other Ambulatory Visit: Payer: Self-pay

## 2015-04-03 ENCOUNTER — Encounter: Payer: Self-pay | Admitting: Podiatry

## 2015-04-03 ENCOUNTER — Telehealth: Payer: Self-pay | Admitting: *Deleted

## 2015-04-03 ENCOUNTER — Other Ambulatory Visit: Payer: Self-pay | Admitting: Internal Medicine

## 2015-04-03 DIAGNOSIS — G8929 Other chronic pain: Secondary | ICD-10-CM

## 2015-04-03 DIAGNOSIS — R0989 Other specified symptoms and signs involving the circulatory and respiratory systems: Secondary | ICD-10-CM

## 2015-04-03 NOTE — Telephone Encounter (Signed)
Pt called requesting the nurse to call back regarding pain med.

## 2015-04-03 NOTE — Telephone Encounter (Addendum)
-----   Message from Trula Slade, DPM sent at 04/03/2015  7:10 AM EST ----- Can you please order arterial studies at [facility] for him? Thanks.   Orders faxed with insurance info.  Informed pt of Dr. Leigh Aurora orders and that he would be receiving a call to be tested of circulation, pt states he wanted to see Dr. Jacqualyn Posey again, because he was in pain.  Transferred to schedulers.

## 2015-04-03 NOTE — Telephone Encounter (Signed)
Pt states the callous on the bottom of his foot was trimmed in the center but not the edges and is terribly painful.  I asked the pt if he scheduled an appt after I spoke with him concerning his circulation test and transferred him to schedulers.  Pt stated no, so I told him to hold while I transferred him to the schedulers again to make an appt.  Pt agreed.

## 2015-04-03 NOTE — Telephone Encounter (Signed)
Last refill 10/13 from internal medicine  -  rx for 8 tabs given on 11/11 from podiatry  Last office visit 10/13 Future apt 07/10/15 Last UDS 10/11/14

## 2015-04-05 ENCOUNTER — Encounter: Payer: Commercial Managed Care - HMO | Admitting: Internal Medicine

## 2015-04-05 DIAGNOSIS — R0989 Other specified symptoms and signs involving the circulatory and respiratory systems: Secondary | ICD-10-CM

## 2015-04-05 NOTE — Telephone Encounter (Signed)
Patient called back regarding rx.

## 2015-04-06 ENCOUNTER — Other Ambulatory Visit: Payer: Self-pay | Admitting: Internal Medicine

## 2015-04-06 ENCOUNTER — Ambulatory Visit (INDEPENDENT_AMBULATORY_CARE_PROVIDER_SITE_OTHER): Payer: Commercial Managed Care - HMO | Admitting: Podiatry

## 2015-04-06 ENCOUNTER — Encounter: Payer: Self-pay | Admitting: Internal Medicine

## 2015-04-06 ENCOUNTER — Encounter: Payer: Self-pay | Admitting: Podiatry

## 2015-04-06 ENCOUNTER — Ambulatory Visit (HOSPITAL_COMMUNITY)
Admission: RE | Admit: 2015-04-06 | Discharge: 2015-04-06 | Disposition: A | Discharge status: Home / Self Care | Payer: Commercial Managed Care - HMO | Source: Ambulatory Visit | Attending: Vascular Surgery | Admitting: Vascular Surgery

## 2015-04-06 DIAGNOSIS — R0989 Other specified symptoms and signs involving the circulatory and respiratory systems: Secondary | ICD-10-CM

## 2015-04-06 DIAGNOSIS — Q828 Other specified congenital malformations of skin: Secondary | ICD-10-CM

## 2015-04-06 DIAGNOSIS — G8929 Other chronic pain: Secondary | ICD-10-CM

## 2015-04-06 MED ORDER — OXYCODONE-ACETAMINOPHEN 10-325 MG PO TABS: 1.0000 | ORAL_TABLET | ORAL | Status: DC | PRN

## 2015-04-07 ENCOUNTER — Ambulatory Visit: Payer: Commercial Managed Care - HMO | Admitting: Podiatry

## 2015-04-08 NOTE — Progress Notes (Signed)
Subjective:     Patient ID: [name], male   DOB: 1945-07-30, 69 y.o.   MRN: [id number]  HPI patient presents with remaining callus from previous visit   Review of Systems     Objective:   Physical Exam Thickened callus left heel    Assessment:     Callus secondary to pressure    Plan:     Debride further which she tolerated well and seem to give relief of symptoms

## 2015-04-10 ENCOUNTER — Telehealth: Payer: Self-pay | Admitting: *Deleted

## 2015-04-10 DIAGNOSIS — R0989 Other specified symptoms and signs involving the circulatory and respiratory systems: Secondary | ICD-10-CM

## 2015-04-10 NOTE — Telephone Encounter (Signed)
Dr Jacqualyn Posey reviewed arterial dopplers 04/06/2015 at [facility] and referred pt to [facility] for follow up.

## 2015-04-11 IMAGING — CR DG CHEST 1V PORT
1 series · 1 of 1 positions shown · non-contrast
Comparison: 06/02/2013.

CLINICAL DATA: Short of breath.  Atrial fibrillation.

EXAM:
PORTABLE CHEST - 1 VIEW

[AP]
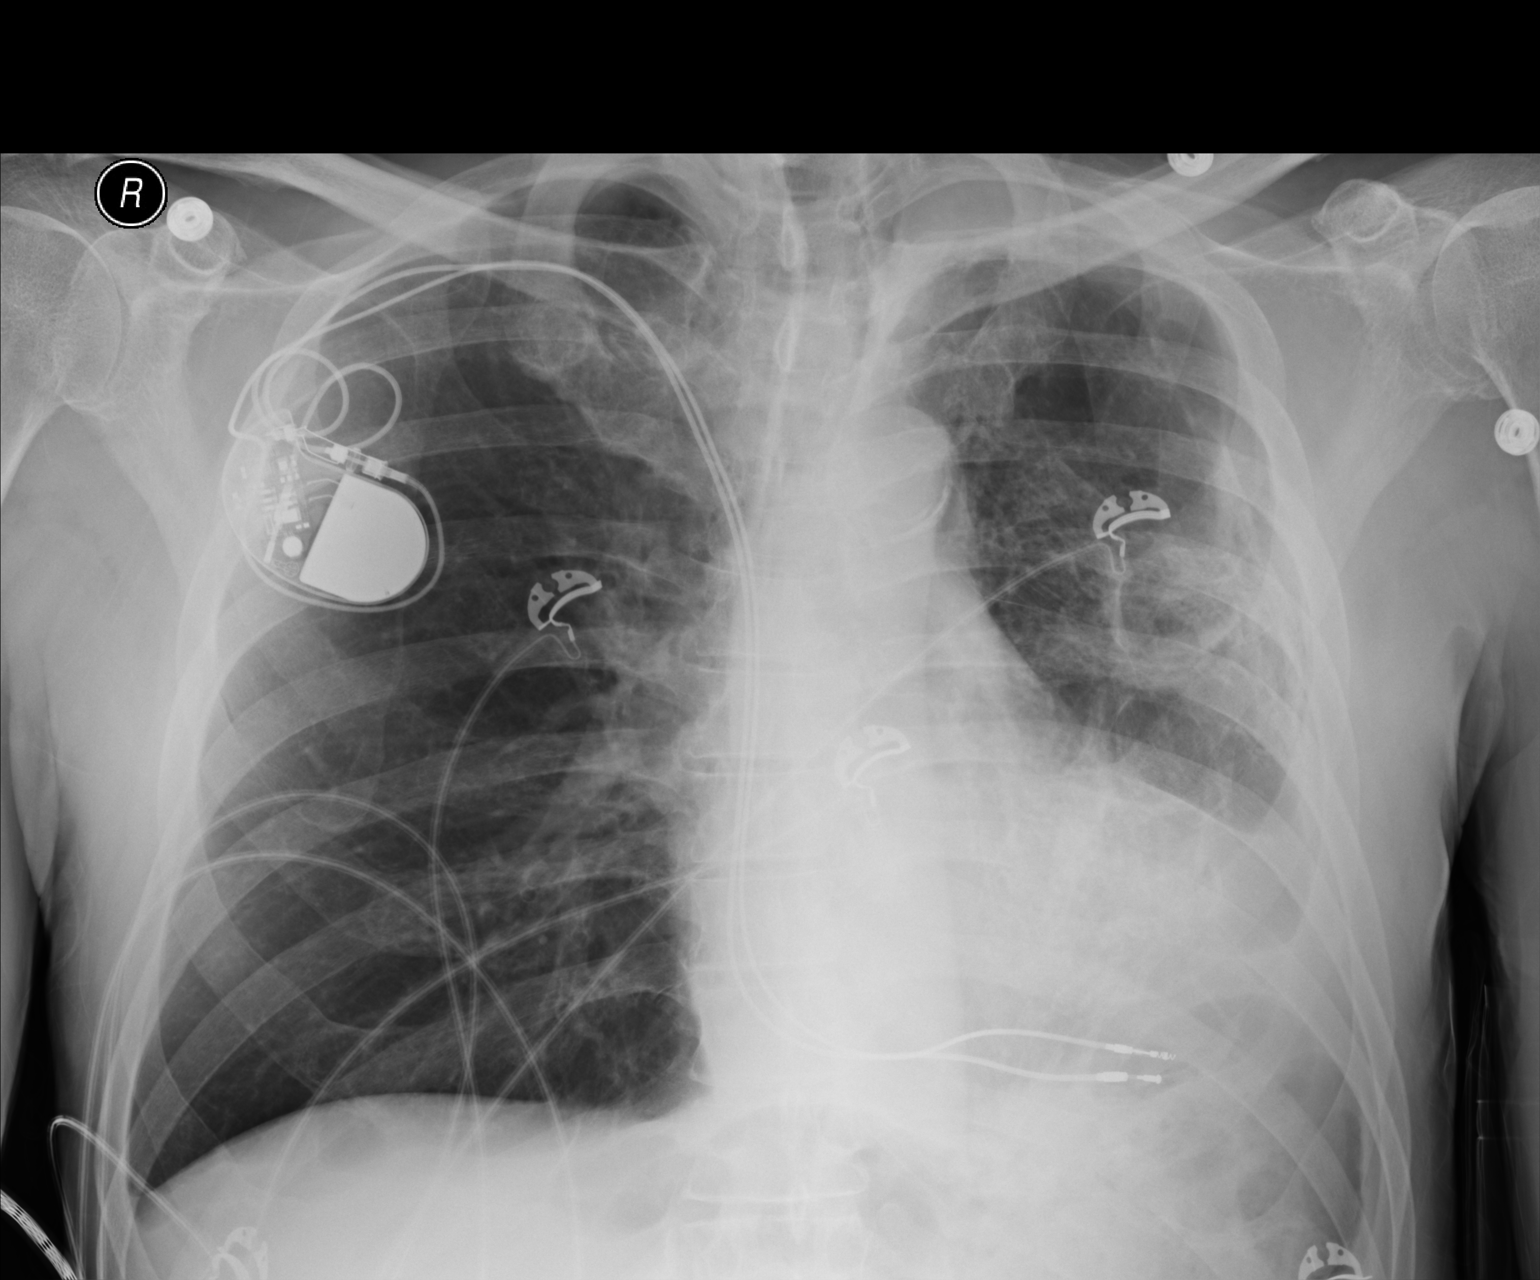

[1 of 1 positions shown; findings below may reference images not displayed]

FINDINGS: There is chronic pleural parenchymal opacity in the left mid and
lower lung zone consistent with chronic scarring. Chronic apical
pleural thickening. There is associated volume loss. The right lung
is hyperexpanded but clear. Cardiac silhouette is normal in size. No
mediastinal or hilar masses.

Right anterior chest wall sequential pacemaker is stable with both
leads projecting in the right ventricle.

Bony thorax is intact.
IMPRESSION: No acute cardiopulmonary disease. Stable chronic findings in the
left lung.

## 2015-04-13 IMAGING — US US ABDOMEN LIMITED
1 series · 14 of 25 positions shown · non-contrast
Comparison: Ultrasound June 17, 2013.

CLINICAL DATA: Elevated liver function tests.

EXAM:
US ABDOMEN LIMITED - RIGHT UPPER QUADRANT

[Series 1: us abdomen limited · 0.25mm/px · 14 of 45 slices shown]
[im 1/45]
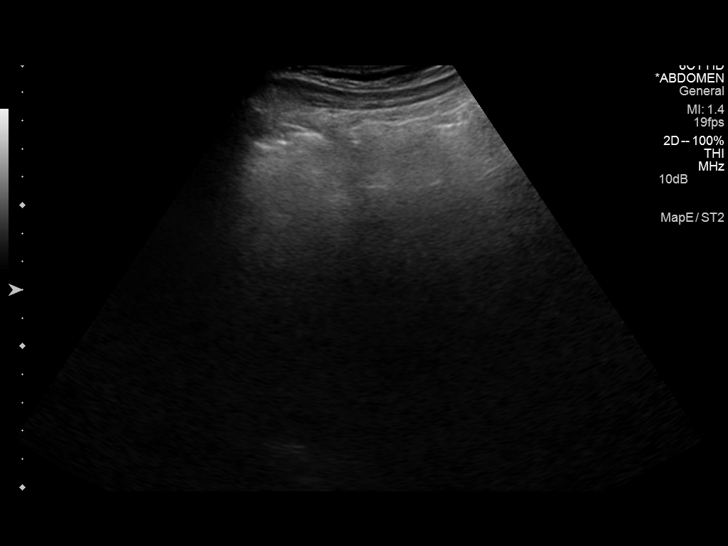
[im 4/45]
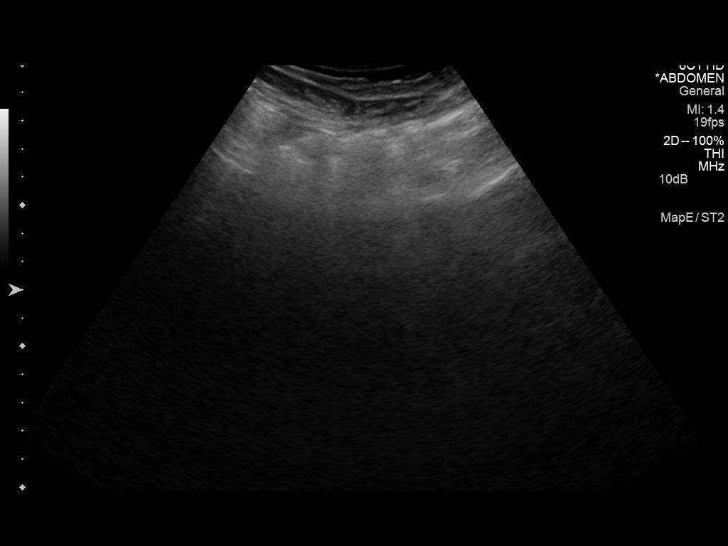
[im 8/45]
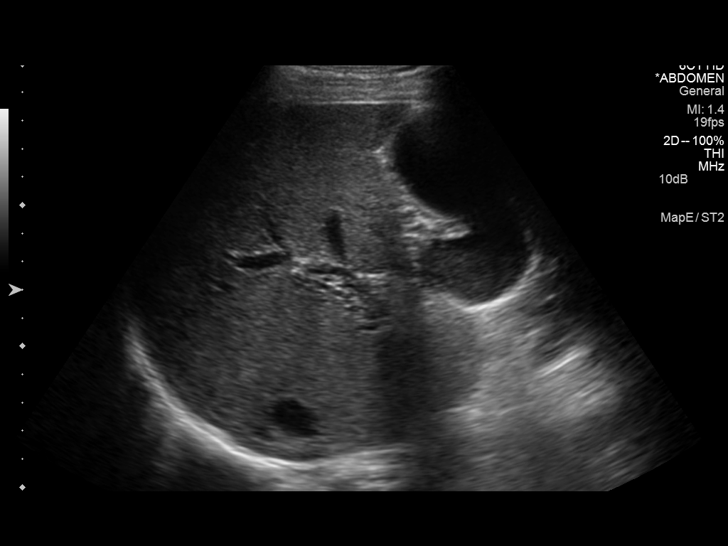
[im 12/45]
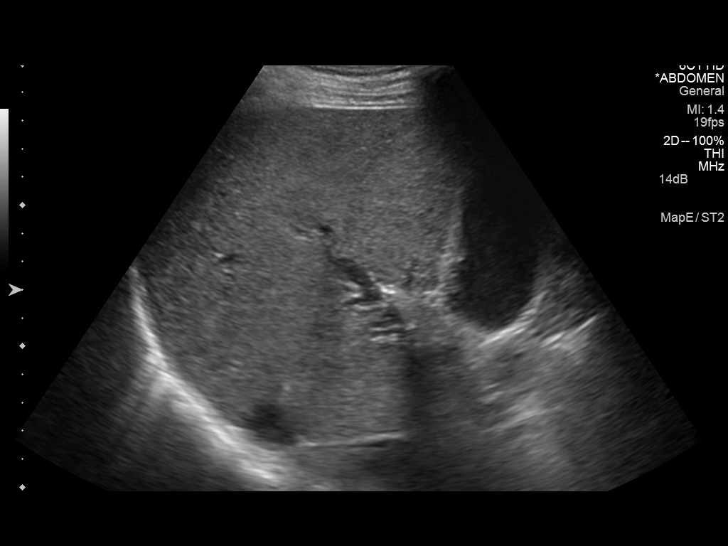
[im 15/45]
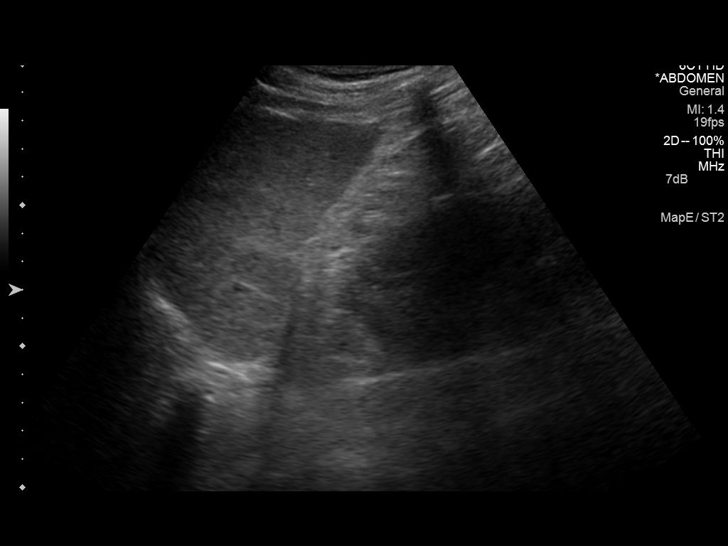
[im 17/45]
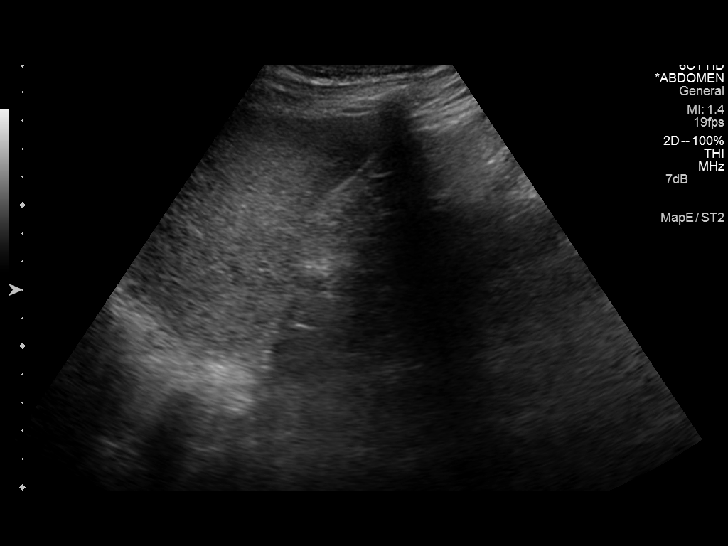
[im 21/45]
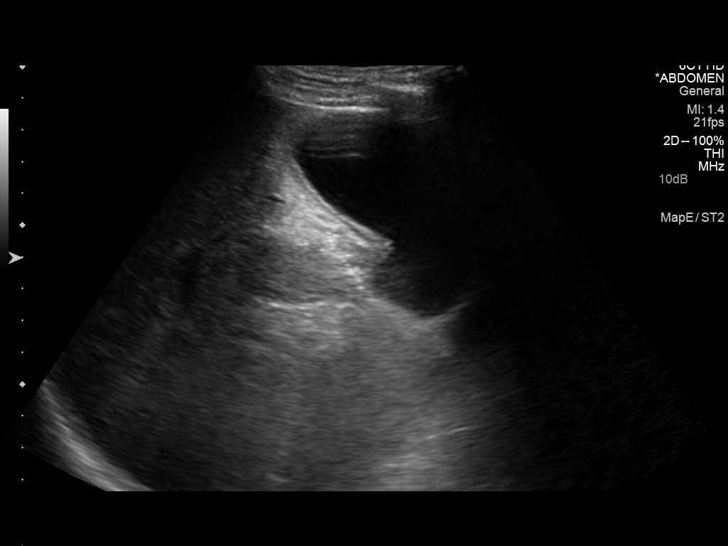
[im 24/45]
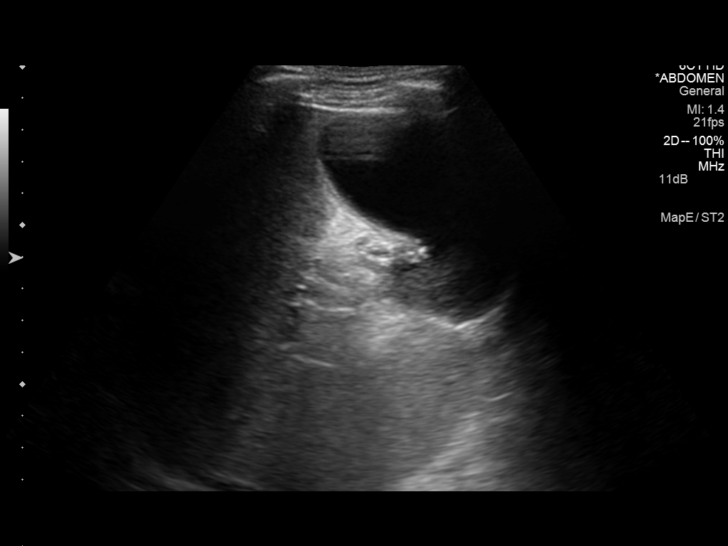
[im 28/45]
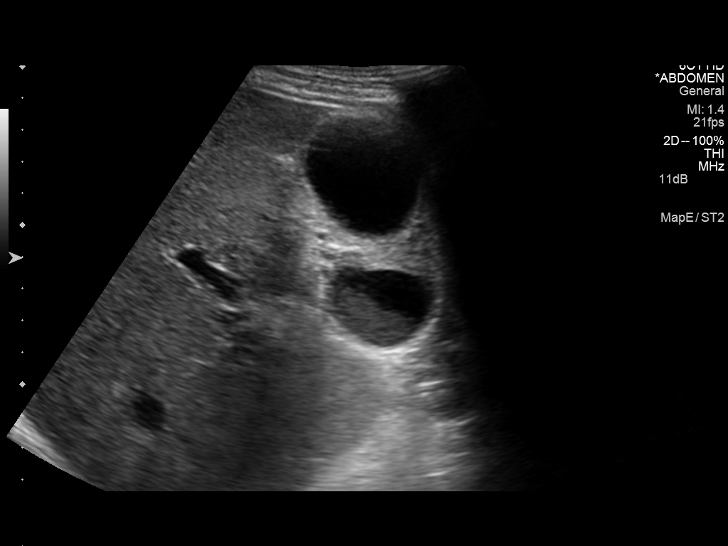
[im 30/45]
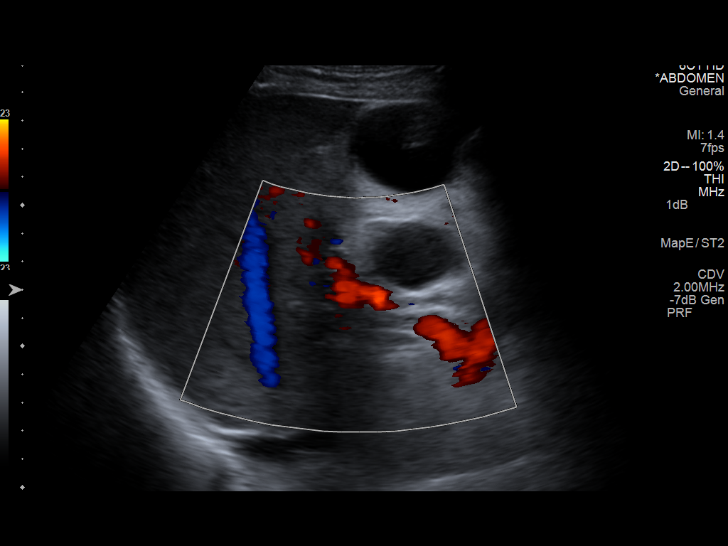
[im 34/45]
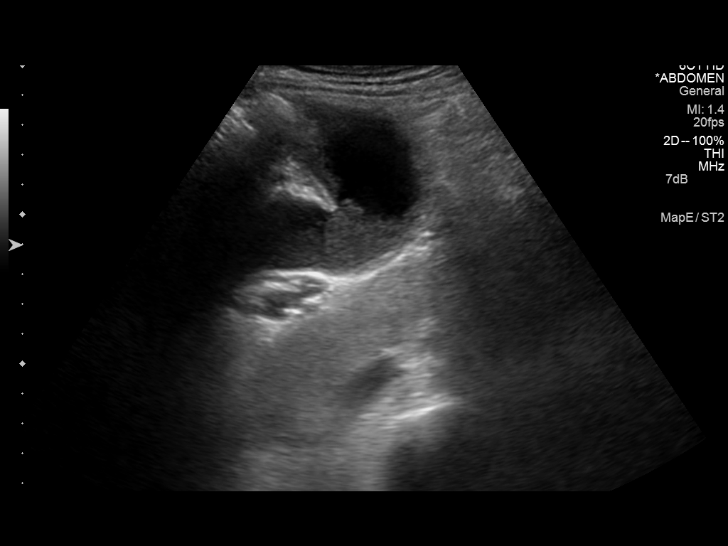
[im 37/45]
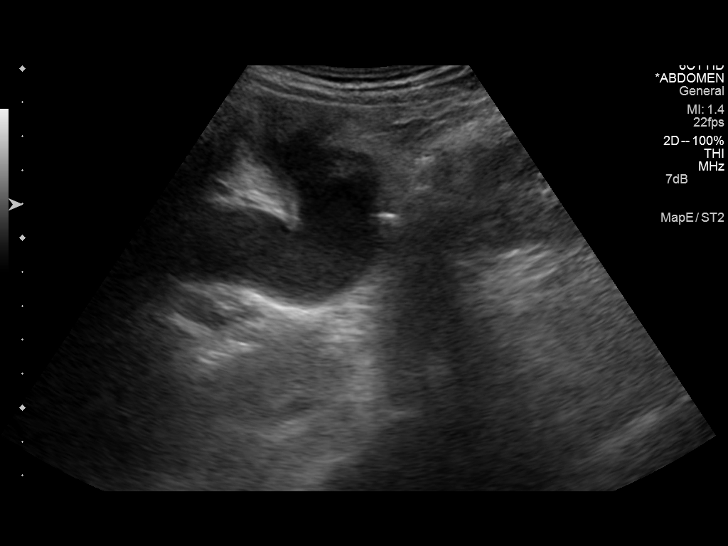
[im 41/45]
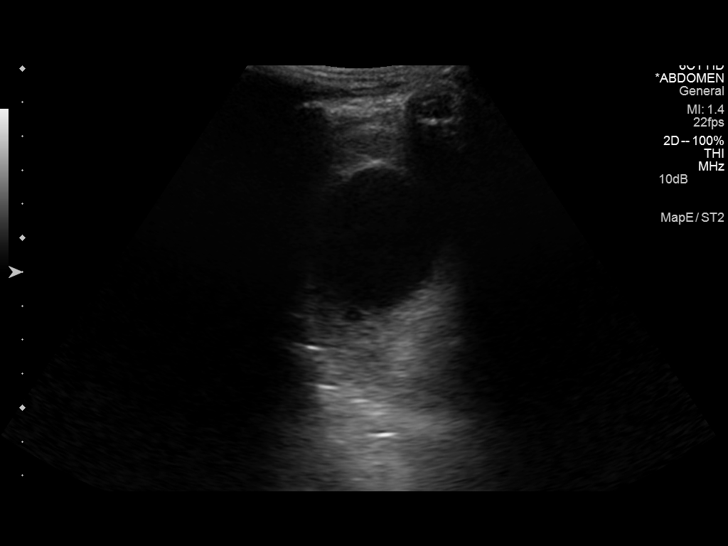
[im 45/45]
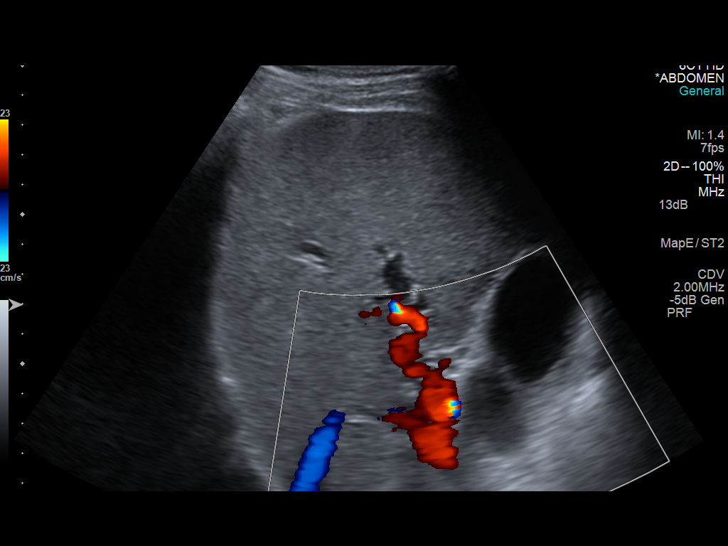

[14 of 25 positions shown; findings below may reference images not displayed]

FINDINGS: Gallbladder:

No gallstones or wall thickening visualized. No sonographic Murphy
sign noted. Mild amount of sludge is present within the gallbladder
lumen.

Common bile duct:

Diameter: 4.8 mm.

Liver:

Heterogeneous echotexture of hepatic parenchyma is noted suggesting
diffuse hepatocellular disease or possibly fatty infiltration. No
focal abnormality is noted.
IMPRESSION: Gallbladder sludge is noted, but no gallstones or evidence of
cholecystitis is noted.

Heterogeneous echotexture of hepatic parenchyma is noted suggesting
diffuse hepatocellular disease or possibly fatty infiltration.

## 2015-04-21 ENCOUNTER — Encounter: Payer: Self-pay | Admitting: Vascular Surgery

## 2015-04-21 DIAGNOSIS — J129 Viral pneumonia, unspecified: Secondary | ICD-10-CM | POA: Diagnosis not present

## 2015-04-21 DIAGNOSIS — J449 Chronic obstructive pulmonary disease, unspecified: Secondary | ICD-10-CM | POA: Diagnosis not present

## 2015-04-21 DIAGNOSIS — N19 Unspecified kidney failure: Secondary | ICD-10-CM | POA: Diagnosis not present

## 2015-04-21 DIAGNOSIS — I4891 Unspecified atrial fibrillation: Secondary | ICD-10-CM | POA: Diagnosis not present

## 2015-04-25 ENCOUNTER — Ambulatory Visit: Payer: Commercial Managed Care - HMO | Admitting: Vascular Surgery

## 2015-05-03 ENCOUNTER — Ambulatory Visit (INDEPENDENT_AMBULATORY_CARE_PROVIDER_SITE_OTHER): Payer: Commercial Managed Care - HMO | Admitting: Internal Medicine

## 2015-05-03 ENCOUNTER — Encounter: Payer: Self-pay | Admitting: Vascular Surgery

## 2015-05-03 ENCOUNTER — Encounter: Payer: Self-pay | Admitting: Internal Medicine

## 2015-05-03 VITALS — Wt 145.5 lb

## 2015-05-03 DIAGNOSIS — F329 Major depressive disorder, single episode, unspecified: Secondary | ICD-10-CM

## 2015-05-03 DIAGNOSIS — G8929 Other chronic pain: Secondary | ICD-10-CM | POA: Diagnosis not present

## 2015-05-03 DIAGNOSIS — M25551 Pain in right hip: Secondary | ICD-10-CM

## 2015-05-03 DIAGNOSIS — M545 Low back pain: Secondary | ICD-10-CM

## 2015-05-03 DIAGNOSIS — F32A Depression, unspecified: Secondary | ICD-10-CM

## 2015-05-03 DIAGNOSIS — M792 Neuralgia and neuritis, unspecified: Secondary | ICD-10-CM

## 2015-05-03 DIAGNOSIS — M25552 Pain in left hip: Secondary | ICD-10-CM | POA: Diagnosis not present

## 2015-05-03 DIAGNOSIS — G5793 Unspecified mononeuropathy of bilateral lower limbs: Secondary | ICD-10-CM | POA: Insufficient documentation

## 2015-05-03 DIAGNOSIS — Z96643 Presence of artificial hip joint, bilateral: Secondary | ICD-10-CM

## 2015-05-03 MED ORDER — GABAPENTIN 300 MG PO CAPS: 300.0000 mg | ORAL_CAPSULE | Freq: Three times a day (TID) | ORAL | Status: DC

## 2015-05-03 MED ORDER — MIRTAZAPINE 15 MG PO TABS: 15.0000 mg | ORAL_TABLET | Freq: Every day | ORAL | Status: DC

## 2015-05-03 MED ORDER — OXYCODONE-ACETAMINOPHEN 10-325 MG PO TABS: 1.0000 | ORAL_TABLET | ORAL | Status: DC | PRN

## 2015-05-03 NOTE — Assessment & Plan Note (Signed)
Patient currently on Bupropion 75 mg BID. He has been on this for several years for his depression, but feels his mood has been worsening lately. He says he has random "crying spells" for the last 3-4 months, but cannot attribute these to any stress or life events recently. He also has a decreased appetite, eating maybe one meal per day. He has tried Ensure, but says he will sometimes vomit this up, along with other food. He has had reported decreased weight loss as well during this time. He is 145 lbs today, 154 lbs in October. He has been on Mirtazapine in the past, which he tolerated well.   His appetite and weight seem to be a chronic issue, but may be contributed from his depression and decreased mood. -Will start Mirtazapine 15 mg nightly which may help both his depression and with increasing his weight. We can titrate this up in 1-2 weeks if necessary. -Continue Wellbutrin (Bupropion) 75 mg BID. -f/u in 1-2 months to monitor progress

## 2015-05-03 NOTE — Patient Instructions (Signed)
Start Mirtazapine 15 mg at night for depression and weight. It may be possible to increase this in 1-2 weeks.  Start Gabapentin 300 mg at night for 1 week for your foot tingling/pain. If you are tolerating it well, you can increase to twice a day.   I am refilling your Percocet. This can be filled 30 days after your last filled prescription.

## 2015-05-03 NOTE — Progress Notes (Signed)
Patient ID: [name], male   DOB: 02-Dec-1945, 69 y.o.   MRN: [id number]   Subjective:   Patient ID: [name] male   DOB: 1945/05/27 69 y.o.   MRN: [id number]  HPI: [name] is a 69 y.o. male with PMH as listed below who presents for management of his chronic pain and medication refill. Please see problem list for status of patient's chronic medical issues.    Past Medical History  Diagnosis Date  . Hypertension   . Chronic systolic heart failure ([place])     a. NICM - EF 35% with normal cors 2003. b. Last echo 11/2012 - EF 25-30%.  . Depression   . GERD (gastroesophageal reflux disease)   . Portal vein thrombosis   . Avascular necrosis of bones of both hips ([facility])     Status post bilateral hip replacements  . Gastritis   . Alcohol abuse   . Erectile dysfunction   . Bipolar disorder ([place])   . Hydrocele, unspecified   . Lung nodule     Chest CT scan on 12/14/2008 showed a nodular opacity at the left lung base felt to most likely represent scarring.  Follow-up chest CT scan on 06/02/2010 showed parenchymal scarring in the left apex, left  lower lobe, and lingula; some of this scarring at the left base had a nodular appearance, unchanged. Chest 09/2012 - stable.  . 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  Thyroid scan 08/28/2009 was normal with no focal areas of abnormal increased or decreased activity seen; the uptake of I 131 sodium iodide at 24 hours was 5.7%.  TSH and free T4 normalized by 08/16/2009.  . Intestinal obstruction ([place])   . COPD (chronic obstructive pulmonary disease) ([place])   . E coli bacteremia   . DVT (deep venous thrombosis) ([place])     He has hypercoagulability with multiple prior DVTs  . Pleural plaque     H/o asbestos exposure. Chest CT on 06/02/2010 showed stable extensive calcified pleural plaques involving the left hemithorax, consistent with asbestos related pleural disease.  . Weight loss     Normal  colonoscopy by Dr. Olevia Perches on 11/06/2010.  . Tachycardia-bradycardia syndrome [facility])     a. 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 b. [employer] gen change 2010.  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  . Noncompliance   . Thrombocytopenia ([place])   . Osteoarthritis   . Spondylosis   . Anxiety   . CKD (chronic kidney disease)     Renal U/S 12/04/2009 showed no pathological findings. Labs 12/04/2009 include normal ESR, C3, C4; neg ANA; SPEP showed nonspecific increase in the alpha-2 region with no M-spike; UPEP showed no monoclonal free light chains; urine IFE showed polyclonal increase in feree Kappa and/or free Lambda light chains. Baseline Cr reported 1.7-2.5.  . STEMI (ST elevation myocardial infarction) ([place]) 10/11/2014  . Hyperlipemia    Current Outpatient Prescriptions  Medication Sig Dispense Refill  . albuterol (VENTOLIN HFA) 108 (90 BASE) MCG/ACT inhaler Inhale 2 puffs into the lungs every 6 (six) hours as needed for wheezing or shortness of breath. 3 Inhaler 3  . apixaban (ELIQUIS) 5 MG TABS tablet Take 1 tablet (5 mg total) by mouth 2 (two) times daily. 180 tablet 3  . aspirin 81 MG chewable tablet Chew 1 tablet (81 mg total) by mouth daily. 30 tablet  12  . buPROPion (WELLBUTRIN) 75 MG tablet Take 1 tablet (75 mg total) by mouth 2 (two) times daily. 180 tablet 3  . digoxin (LANOXIN) 0.125 MG tablet Take 0.5 tablets (0.0625 mg total) by mouth daily. 45 tablet 3  . diltiazem (CARDIZEM CD) 120 MG 24 hr capsule Take 1 capsule (120 mg total) by mouth daily. 90 capsule 3  . diphenhydrAMINE (BENADRYL) 2 % cream Apply 1 application topically 3 (three) times daily as needed for itching.    . feeding supplement, ENSURE ENLIVE, (ENSURE ENLIVE) LIQD Take 237 mLs by mouth 2 (two) times daily between meals. 237 mL 12  . metoprolol (TOPROL-XL) 200 MG 24 hr tablet Take 1 tablet (200 mg total) by mouth  daily. Take with or immediately following a meal. 30 tablet 11  . omeprazole (PRILOSEC) 40 MG capsule Take 1 capsule (40 mg total) by mouth daily. 90 capsule 3  . oxyCODONE-acetaminophen (PERCOCET) 10-325 MG tablet Take 1 tablet by mouth every 4 (four) hours as needed for pain. Do not take more than 5 doses in a 24 hour period. 150 tablet 0  . rosuvastatin (CRESTOR) 20 MG tablet Take 1 tablet (20 mg total) by mouth daily at 6 PM. 90 tablet 3  . senna-docusate ([employer] SENNA PLUS) 8.6-50 MG per tablet Take 2 tablets by mouth 2 (two) times daily. 360 tablet 3  . tiotropium (SPIRIVA) 18 MCG inhalation capsule Place 1 capsule (18 mcg total) into inhaler and inhale daily. 90 capsule 3  . bisacodyl (DULCOLAX) 10 MG suppository Place 1 suppository (10 mg total) rectally daily as needed for severe constipation. (Patient not taking: Reported on 05/03/2015) 12 suppository 5  . gabapentin (NEURONTIN) 300 MG capsule Take 1 capsule (300 mg total) by mouth 3 (three) times daily. 60 capsule 2  . mirtazapine (REMERON) 15 MG tablet Take 1 tablet (15 mg total) by mouth at bedtime. 30 tablet 2  . Urea 37.5 % CREA Apply 1 application topically daily. (Patient not taking: Reported on 05/03/2015) 142 g 0   No current facility-administered medications for this visit.   Family History  Problem Relation Age of Onset  . Hypertension Mother   . Cancer Brother     Unsure of type.  . Asthma Mother   . Coronary artery disease Mother   . Colon cancer Neg Hx   . Lung cancer Neg Hx   . Prostate cancer Neg Hx   . Stroke      mother  . Heart attack      brother and sister ?age    Social History   Social History  . Marital Status: Widowed    Spouse Name: N/A  . Number of Children: 10  . Years of Education: N/A   Occupational History  . RETIRED    Social History Main Topics  . Smoking status: Former Smoker -- 0.33 packs/day for 54 years    Types: Cigarettes    Quit date: 10/11/2014  . Smokeless tobacco: Never Used   . Alcohol Use: No     Comment: 6/22//2016 "h/o etoh abuse; stopped drinking ~ 2012"  . Drug Use: No     Comment: 11/09/2014 "none since 10/11/2014; did smoke marijuana ~ 1 time/month"  . Sexual Activity: Not Currently   Other Topics Concern  . None   Social History Narrative   Works as a Architect part time   Smoker 1 pack last 1.5 days tobacco, smokes marijuana    Denies EtOH x 4 years (on  10/12/12)   9 kids    From [place]   [place] Veteran    12 grade education             Review of Systems: Review of Systems  Constitutional: Positive for weight loss. Negative for fever, chills and diaphoresis.  Respiratory: Positive for shortness of breath. Negative for cough and sputum production.   Cardiovascular: Positive for palpitations. Negative for chest pain, orthopnea and leg swelling.  Gastrointestinal: Positive for nausea and vomiting. Negative for abdominal pain, diarrhea, constipation and blood in stool.  Genitourinary: Negative for dysuria and hematuria.  Musculoskeletal: Positive for back pain and joint pain. Negative for falls.  Neurological: Positive for tingling and sensory change. Negative for dizziness and headaches.  Psychiatric/Behavioral: Positive for depression.    Objective:  Physical Exam: Filed Vitals:   05/03/15 1545  Weight: 145 lb 8 oz (65.998 kg)   Physical Exam  Constitutional: He is oriented to person, place, and time.  Thin, pleasant, elderly gentleman in no acute distress  HENT:  Head: Normocephalic and atraumatic.  Mouth/Throat: Oropharynx is clear and moist.  Eyes: EOM are normal. Pupils are equal, round, and reactive to light.  Neck: Normal range of motion. Neck supple.  Cardiovascular: Normal rate.  An irregularly irregular rhythm present.  Pulses:      Dorsalis pedis pulses are 1+ on the right side, and 1+ on the left side.  Pulmonary/Chest: Effort normal. No respiratory distress. He has no wheezes. He has no rales. He exhibits no  tenderness.  Abdominal: Soft. Bowel sounds are normal. There is no tenderness.  Musculoskeletal: He exhibits no edema or tenderness.  10th rib pronounced on right lower chest wall.  Lymphadenopathy:    He has no cervical adenopathy.  Neurological: He is alert and oriented to person, place, and time.  Decreased sensation of bilateral feet at heels and toes  Skin:  Yellow callous on left heel, approximately 1.5 x 1.0 cm, tender to touch. No purulent or sanguinous drainage.  Psychiatric: He has a normal mood and affect.    Assessment & Plan:  Please see problem based charting for current assessment and plan.

## 2015-05-03 NOTE — Assessment & Plan Note (Signed)
Patient reports a pins and needles sensation in both of his feet with a stinging/burning sensation at the bottom of his feet. He says this began about 6 months ago, but has had similar symptoms about 4-5 years ago. It was treated with Lyrica at that time which was helpful, but is not sure why it was discontinued. He does have decreased sensation to touch at his heels and plantar surface of toes bilaterally. His pain is isolated to his feet, does not shoot up his leg. He has hyperkeratotic lesions on his feet which are treated by Podiatry.  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 He does not have a history of diabetes. -Start Gabapentin 300 mg at night for 1 week, then increase to BID. Renal dosing for this patient with CrCl of b/w 30 and 59 is recommended to be 200 to 700 mg twice daily. -Lyrica was not initiated as it is not on his insurance formulary

## 2015-05-03 NOTE — Assessment & Plan Note (Signed)
Patient with chronic hip and low back pain, bilateral hip replacements, and OA of multiple joints. He is Percocet 10-325 mg q4h prn #150 per month pain contract. He says his pain is well controlled with narcotic therapy and helps him to do his activities of daily living. He has instructions to avoid taking more than 5 pills per 24 hour period, but admits that sometimes he uses 6 in one day. He is requesting a refill for his medication and states that he used his last Percocet this morning. He understands that his next refill is not due for a couple more days and that he likely ran out early when he took extra pills during the day. -Refill Percocet 10-325 mg q4h prn pain #150 per month with instructions to avoid using more than 5 pills per 24 hour period. He is given a printed prescription that has instructions not to be filled until 30 days after last filled. -Consider UDS and/or Pain contract on next visit

## 2015-05-09 ENCOUNTER — Ambulatory Visit: Payer: Commercial Managed Care - HMO | Admitting: Vascular Surgery

## 2015-05-09 ENCOUNTER — Encounter: Payer: Self-pay | Admitting: Podiatry

## 2015-05-09 ENCOUNTER — Ambulatory Visit (INDEPENDENT_AMBULATORY_CARE_PROVIDER_SITE_OTHER): Payer: Commercial Managed Care - HMO | Admitting: Podiatry

## 2015-05-09 DIAGNOSIS — B351 Tinea unguium: Secondary | ICD-10-CM | POA: Diagnosis not present

## 2015-05-09 DIAGNOSIS — M79673 Pain in unspecified foot: Secondary | ICD-10-CM

## 2015-05-09 DIAGNOSIS — Q828 Other specified congenital malformations of skin: Secondary | ICD-10-CM | POA: Diagnosis not present

## 2015-05-09 DIAGNOSIS — I739 Peripheral vascular disease, unspecified: Secondary | ICD-10-CM

## 2015-05-09 NOTE — Progress Notes (Signed)
I saw and evaluated the patient.  I personally confirmed the key portions of Dr. Patel's history and exam and reviewed pertinent patient test results.  The assessment, diagnosis, and plan were formulated together and I agree with the documentation in the resident's note. 

## 2015-05-10 ENCOUNTER — Encounter: Payer: Self-pay | Admitting: Podiatry

## 2015-05-10 NOTE — Progress Notes (Signed)
Patient ID: [name], male   DOB: December 06, 1945, 69 y.o.   MRN: [id number]  Subjective: 69 year old male presents the office they for recurrence of painful calluses to both of his feet as well as her painful toenails which cannot himself to the thickness. He denies any redness or drainage from the callus over the nail sites. He states the calluses become painful with pressure and weightbearing. No edema. No other complaints at this time. He denies any systemic complaints such as fevers, chills, nausea, vomiting. No calf pain, chest pain, shortness of breath.  Objective: AAO 3, NAD DP/PT pulses decreased, CRT less than 3 seconds Protective sensation decreased with Sims once the monofilament Hyperkeratotic lesions present left plantar hallux, left submetatarsal 1/5, left fourth toe, and plantar heel and on the right foot submetatarsal one and medial hallux. Upon debridement there is no underlying ulceration, drainage or other signs of infection. No clinical signs of infection this time. Nails are hypertrophic, dystrophic, brittle, discolored, elongated 10. No surrounding erythema or drainage. Tenderness in nails 1-5 bilaterally. No other open lesions or pre-ulcerative lesions No pain with calf compression, swelling, warmth, erythema.  Assessment: 69 year old male with symptomatic hyperkeratotic lesions, onychomycosis  Plan: -Treatment options discussed including all alternatives, risks, and complications -Nail sharply debrided 10 without, complications/bleeding. -Hyperkeratotic lesion sharply debrided 7 without, complications/bleeding -Discussed daily foot inspection -Follow-up in 9 weeks or sooner if any problems arise. In the meantime, encouraged to call the office with any questions, concerns, change in symptoms.   Celesta Gentile, DPM

## 2015-05-22 DIAGNOSIS — J129 Viral pneumonia, unspecified: Secondary | ICD-10-CM | POA: Diagnosis not present

## 2015-05-22 DIAGNOSIS — N19 Unspecified kidney failure: Secondary | ICD-10-CM | POA: Diagnosis not present

## 2015-05-22 DIAGNOSIS — I4891 Unspecified atrial fibrillation: Secondary | ICD-10-CM | POA: Diagnosis not present

## 2015-05-22 DIAGNOSIS — J449 Chronic obstructive pulmonary disease, unspecified: Secondary | ICD-10-CM | POA: Diagnosis not present

## 2015-05-26 ENCOUNTER — Ambulatory Visit: Payer: Commercial Managed Care - HMO | Admitting: Podiatry

## 2015-05-29 ENCOUNTER — Other Ambulatory Visit: Payer: Self-pay | Admitting: Internal Medicine

## 2015-05-29 ENCOUNTER — Other Ambulatory Visit: Payer: Self-pay | Admitting: *Deleted

## 2015-05-29 DIAGNOSIS — G8929 Other chronic pain: Secondary | ICD-10-CM

## 2015-05-29 NOTE — Telephone Encounter (Signed)
Last filled 12/14 Next visit 2/20

## 2015-05-29 NOTE — Telephone Encounter (Signed)
Pt requesting the nurse to call back regarding mirtazapine med. Please call pt back.

## 2015-05-31 MED ORDER — OXYCODONE-ACETAMINOPHEN 10-325 MG PO TABS: 1.0000 | ORAL_TABLET | ORAL | Status: DC | PRN

## 2015-05-31 NOTE — Telephone Encounter (Signed)
Pt informed

## 2015-06-16 ENCOUNTER — Ambulatory Visit (HOSPITAL_COMMUNITY)
Admission: RE | Admit: 2015-06-16 | Discharge: 2015-06-16 | Disposition: A | Discharge status: Home / Self Care | Payer: Commercial Managed Care - HMO | Source: Ambulatory Visit | Attending: Student in an Organized Health Care Education/Training Program | Admitting: Student in an Organized Health Care Education/Training Program

## 2015-06-16 ENCOUNTER — Ambulatory Visit (INDEPENDENT_AMBULATORY_CARE_PROVIDER_SITE_OTHER): Payer: Commercial Managed Care - HMO | Admitting: Internal Medicine

## 2015-06-16 ENCOUNTER — Encounter: Payer: Self-pay | Admitting: Internal Medicine

## 2015-06-16 VITALS — BP 106/76 | HR 69 | Temp 98.1°F | Wt 146.0 lb

## 2015-06-16 DIAGNOSIS — J438 Other emphysema: Secondary | ICD-10-CM

## 2015-06-16 DIAGNOSIS — J449 Chronic obstructive pulmonary disease, unspecified: Secondary | ICD-10-CM | POA: Insufficient documentation

## 2015-06-16 DIAGNOSIS — R059 Cough, unspecified: Secondary | ICD-10-CM

## 2015-06-16 DIAGNOSIS — R05 Cough: Secondary | ICD-10-CM | POA: Diagnosis not present

## 2015-06-16 DIAGNOSIS — J984 Other disorders of lung: Secondary | ICD-10-CM | POA: Diagnosis not present

## 2015-06-16 DIAGNOSIS — R0602 Shortness of breath: Secondary | ICD-10-CM | POA: Insufficient documentation

## 2015-06-16 DIAGNOSIS — R630 Anorexia: Secondary | ICD-10-CM

## 2015-06-16 LAB — CBC WITH DIFFERENTIAL/PLATELET
BASOS ABS: 0 10*3/uL (ref 0.0–0.1)
Basophils Relative: 0 %
EOS ABS: 0.3 10*3/uL (ref 0.0–0.7)
EOS PCT: 4 %
HCT: 46.1 % (ref 39.0–52.0)
HEMOGLOBIN: 15.2 g/dL (ref 13.0–17.0)
LYMPHS PCT: 34 %
Lymphs Abs: 2.3 10*3/uL (ref 0.7–4.0)
MCH: 30.5 pg (ref 26.0–34.0)
MCHC: 33 g/dL (ref 30.0–36.0)
MCV: 92.4 fL (ref 78.0–100.0)
Monocytes Absolute: 0.5 10*3/uL (ref 0.1–1.0)
Monocytes Relative: 7 %
NEUTROS PCT: 55 %
Neutro Abs: 3.7 10*3/uL (ref 1.7–7.7)
PLATELETS: 154 10*3/uL (ref 150–400)
RBC: 4.99 MIL/uL (ref 4.22–5.81)
RDW: 16 % — ABNORMAL HIGH (ref 11.5–15.5)
WBC: 6.8 10*3/uL (ref 4.0–10.5)

## 2015-06-16 NOTE — Progress Notes (Signed)
Subjective:    Patient ID: [name], male    DOB: [birth year], 70 y.o.   MRN: [id number]  HPI Comments: [name] is a 70 year old male with PMH as below here with acute c/o decreased appetite, increased fatigue and tremor for the past month.  Also, reports fall in driveway last week landing on right hip.  Did not hit head.  He was able to get up after a few minutes and get up and walk into the house.  He has been using Ben gay on the hip.  He feels that he tripped and denies LOC or chest pain during the event.  Also, with cold symptoms x 2 weeks.    Past Medical History  Diagnosis Date  . Hypertension   . Chronic systolic heart failure ([place])     a. NICM - EF 35% with normal cors 2003. b. Last echo 11/2012 - EF 25-30%.  . Depression   . GERD (gastroesophageal reflux disease)   . Portal vein thrombosis   . Avascular necrosis of bones of both hips ([facility])     Status post bilateral hip replacements  . Gastritis   . Alcohol abuse   . Erectile dysfunction   . Bipolar disorder ([place])   . Hydrocele, unspecified   . Lung nodule     Chest CT scan on 12/14/2008 showed a nodular opacity at the left lung base felt to most likely represent scarring.  Follow-up chest CT scan on 06/02/2010 showed parenchymal scarring in the left apex, left  lower lobe, and lingula; some of this scarring at the left base had a nodular appearance, unchanged. Chest 09/2012 - stable.  . 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  Thyroid scan 08/28/2009 was normal with no focal areas of abnormal increased or decreased activity seen; the uptake of I 131 sodium iodide at 24 hours was 5.7%.  TSH and free T4 normalized by 08/16/2009.  . Intestinal obstruction ([place])   . COPD (chronic obstructive pulmonary disease) ([place])   . E coli bacteremia   . DVT (deep venous thrombosis) ([place])     He has hypercoagulability with multiple prior DVTs  . Pleural plaque     H/o asbestos exposure. Chest  CT on 06/02/2010 showed stable extensive calcified pleural plaques involving the left hemithorax, consistent with asbestos related pleural disease.  . Weight loss     Normal colonoscopy by Dr. Olevia Perches on 11/06/2010.  . Tachycardia-bradycardia syndrome [facility])     a. 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 b. St. Jude gen change 2010.  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  . Noncompliance   . Thrombocytopenia ([place])   . Osteoarthritis   . Spondylosis   . Anxiety   . CKD (chronic kidney disease)     Renal U/S 12/04/2009 showed no pathological findings. Labs 12/04/2009 include normal ESR, C3, C4; neg ANA; SPEP showed nonspecific increase in the alpha-2 region with no M-spike; UPEP showed no monoclonal free light chains; urine IFE showed polyclonal increase in feree Kappa and/or free Lambda light chains. Baseline Cr reported 1.7-2.5.  . STEMI (ST elevation myocardial infarction) ([place]) 10/11/2014  . Hyperlipemia    Current Outpatient Prescriptions on File Prior to Visit  Medication Sig Dispense Refill  . albuterol (VENTOLIN HFA) 108 (90 BASE) MCG/ACT inhaler Inhale 2 puffs into the lungs every 6 (six) hours as  needed for wheezing or shortness of breath. 3 Inhaler 3  . apixaban (ELIQUIS) 5 MG TABS tablet Take 1 tablet (5 mg total) by mouth 2 (two) times daily. 180 tablet 3  . aspirin 81 MG chewable tablet Chew 1 tablet (81 mg total) by mouth daily. 30 tablet 12  . bisacodyl (DULCOLAX) 10 MG suppository Place 1 suppository (10 mg total) rectally daily as needed for severe constipation. (Patient not taking: Reported on 05/03/2015) 12 suppository 5  . buPROPion (WELLBUTRIN) 75 MG tablet Take 1 tablet (75 mg total) by mouth 2 (two) times daily. 180 tablet 3  . digoxin (LANOXIN) 0.125 MG tablet Take 0.5 tablets (0.0625 mg total) by mouth daily. 45 tablet 3  . diltiazem (CARDIZEM CD) 120 MG 24 hr capsule  Take 1 capsule (120 mg total) by mouth daily. 90 capsule 3  . diphenhydrAMINE (BENADRYL) 2 % cream Apply 1 application topically 3 (three) times daily as needed for itching.    . feeding supplement, ENSURE ENLIVE, (ENSURE ENLIVE) LIQD Take 237 mLs by mouth 2 (two) times daily between meals. 237 mL 12  . gabapentin (NEURONTIN) 300 MG capsule Take 1 capsule (300 mg total) by mouth 3 (three) times daily. 60 capsule 2  . metoprolol (TOPROL-XL) 200 MG 24 hr tablet Take 1 tablet (200 mg total) by mouth daily. Take with or immediately following a meal. 30 tablet 11  . mirtazapine (REMERON) 15 MG tablet Take 1 tablet (15 mg total) by mouth at bedtime. 30 tablet 2  . omeprazole (PRILOSEC) 40 MG capsule Take 1 capsule (40 mg total) by mouth daily. 90 capsule 3  . oxyCODONE-acetaminophen (PERCOCET) 10-325 MG tablet Take 1 tablet by mouth every 4 (four) hours as needed for pain. Do not take more than 5 doses in a 24 hour period. 150 tablet 0  . rosuvastatin (CRESTOR) 20 MG tablet Take 1 tablet (20 mg total) by mouth daily at 6 PM. 90 tablet 3  . senna-docusate ([employer] SENNA PLUS) 8.6-50 MG per tablet Take 2 tablets by mouth 2 (two) times daily. 360 tablet 3  . tiotropium (SPIRIVA) 18 MCG inhalation capsule Place 1 capsule (18 mcg total) into inhaler and inhale daily. 90 capsule 3  . Urea 37.5 % CREA Apply 1 application topically daily. (Patient not taking: Reported on 05/03/2015) 142 g 0   No current facility-administered medications on file prior to visit.    Review of Systems  Constitutional: Positive for appetite change and fatigue. Negative for fever.  HENT: Positive for rhinorrhea. Negative for sneezing, sore throat and trouble swallowing.   Respiratory: Positive for cough and wheezing.        Uses rescue inhaler 3-4x week. Stable DOE. Productive cough for past 1-2x (clear to yellow phlegm)  Cardiovascular: Positive for palpitations. Negative for chest pain.       Occasional ankle edema that responds  to elevation.  Gastrointestinal: Positive for constipation. Negative for nausea, vomiting, abdominal pain, diarrhea and blood in stool.       Occasional constipation that responds to stool softeners. + heartburn 2-3x per week  Endocrine: Positive for cold intolerance, heat intolerance and polydipsia.  Genitourinary: Negative for dysuria and hematuria.  Musculoskeletal: Positive for back pain.       Chronic stable low back pain.  Neurological: Positive for tremors.  Hematological: Does not bruise/bleed easily.  Psychiatric/Behavioral: Positive for sleep disturbance and dysphoric mood. Negative for suicidal ideas. The patient is nervous/anxious.        Says he feels  a little depressed but denies anhedonia, feelings of guilt or worthlessness.  He sometimes feels anxious.       Filed Vitals:   06/16/15 1528 06/16/15 1613  BP: 156/136 106/76  Pulse: 71 69  Temp: 98.1 F (36.7 C)   TempSrc: Oral   Weight: 146 lb (66.225 kg)   SpO2: 100%      Objective:   Physical Exam  Constitutional: He is oriented to person, place, and time. He appears well-developed. No distress.  HENT:  Head: Normocephalic and atraumatic.  Mouth/Throat: Oropharynx is clear and moist. No oropharyngeal exudate.  Eyes: Conjunctivae and EOM are normal. Pupils are equal, round, and reactive to light. Right eye exhibits no discharge. Left eye exhibits no discharge. No scleral icterus.  Neck: Normal range of motion. Neck supple.  Cardiovascular: Normal rate and normal heart sounds.  Exam reveals no gallop and no friction rub.   No murmur heard. Irregularly irregular  Pulmonary/Chest: Effort normal. No respiratory distress. He has no wheezes. He has no rales.  Coarse BS  Abdominal: Soft. Bowel sounds are normal. He exhibits no distension and no mass. There is no tenderness. There is no rebound and no guarding.  Musculoskeletal: Normal range of motion. He exhibits no edema or tenderness.  Lymphadenopathy:    He has no  cervical adenopathy.  Neurological: He is alert and oriented to person, place, and time. He has normal reflexes. No cranial nerve deficit.  Mild tremor of outstretched hands.  Skin: Skin is warm. He is not diaphoretic.  Psychiatric: He has a normal mood and affect. His behavior is normal. Judgment and thought content normal.  Vitals reviewed.         Assessment & Plan:  Please see problem based charting for A&P.

## 2015-06-16 NOTE — Patient Instructions (Signed)
1. Please be sure to stay hydrated.  I will call you with lab results.  We have provided you with Ensure.  I will if [employer] will cover another brand.     2. Please take all medications as prescribed.    3. If you have worsening of your symptoms or new symptoms arise, please call the clinic [phone]), or go to the ER immediately if symptoms are severe.   Please come back to see Dr. [name] in the next month or sooner if you are worse.

## 2015-06-17 ENCOUNTER — Other Ambulatory Visit: Payer: Self-pay | Admitting: Internal Medicine

## 2015-06-17 LAB — CMP14 + ANION GAP
ALBUMIN: 4.2 g/dL (ref 3.5–4.8)
ALT: 20 IU/L (ref 0–44)
AST: 23 IU/L (ref 0–40)
Albumin/Globulin Ratio: 1.5 (ref 1.1–2.5)
Alkaline Phosphatase: 89 IU/L (ref 39–117)
Anion Gap: 20 mmol/L — ABNORMAL HIGH (ref 10.0–18.0)
BILIRUBIN TOTAL: 0.8 mg/dL (ref 0.0–1.2)
BUN / CREAT RATIO: 16 (ref 10–22)
BUN: 41 mg/dL — ABNORMAL HIGH (ref 8–27)
CO2: 15 mmol/L — ABNORMAL LOW (ref 18–29)
CREATININE: 2.51 mg/dL — AB (ref 0.76–1.27)
Calcium: 9 mg/dL (ref 8.6–10.2)
Chloride: 106 mmol/L (ref 96–106)
GFR, EST AFRICAN AMERICAN: 29 mL/min/{1.73_m2} — AB (ref >59)
GFR, EST NON AFRICAN AMERICAN: 25 mL/min/{1.73_m2} — AB (ref >59)
GLUCOSE: 100 mg/dL — AB (ref 65–99)
Globulin, Total: 2.8 g/dL (ref 1.5–4.5)
Potassium: 5.5 mmol/L — ABNORMAL HIGH (ref 3.5–5.2)
Sodium: 141 mmol/L (ref 134–144)
TOTAL PROTEIN: 7 g/dL (ref 6.0–8.5)

## 2015-06-17 LAB — TSH: TSH: 2.87 u[IU]/mL (ref 0.450–4.500)

## 2015-06-19 ENCOUNTER — Other Ambulatory Visit: Payer: Self-pay | Admitting: Internal Medicine

## 2015-06-19 DIAGNOSIS — N179 Acute kidney failure, unspecified: Secondary | ICD-10-CM

## 2015-06-19 DIAGNOSIS — N189 Chronic kidney disease, unspecified: Principal | ICD-10-CM

## 2015-06-19 DIAGNOSIS — R630 Anorexia: Secondary | ICD-10-CM | POA: Insufficient documentation

## 2015-06-19 NOTE — Assessment & Plan Note (Signed)
Assessment:  Course lung sounds but no definite wheeze.  Stat CXR negative for acute abnormality.  no leukocytosis on stat CBC.  VSS. Cough likely due to chronic COPD. Plan:  Continue Spiriva and prn albuterol.

## 2015-06-19 NOTE — Assessment & Plan Note (Addendum)
Assessment:  Patient with complaint of overall "not feeling well," decreased appetite, increased fatigue and tremor of hands in past month.  Also with some anxiety due to poor relationship with son, whom he is now living with.  He is maintaining hydration and likes Ensure. Plan: he has hx of hyperthyroidism and his symptoms are concerning for thyroid abnormality - check TSH.  Also, check CMP given above symptoms.  HIV and HCV negative 7 months ago.  Normal colonoscopy in 2012.  He was provided with Ensure, which he says he likes but he cannot afford it.  I will check to see how we can get him additional cans or if [facility] will "cover" other brands of supplement.  I have asked him to return to see PCP for follow-up next month.

## 2015-06-20 ENCOUNTER — Ambulatory Visit (INDEPENDENT_AMBULATORY_CARE_PROVIDER_SITE_OTHER): Payer: Commercial Managed Care - HMO | Admitting: Internal Medicine

## 2015-06-20 ENCOUNTER — Other Ambulatory Visit (INDEPENDENT_AMBULATORY_CARE_PROVIDER_SITE_OTHER): Payer: Commercial Managed Care - HMO

## 2015-06-20 ENCOUNTER — Ambulatory Visit (HOSPITAL_COMMUNITY)
Admission: RE | Admit: 2015-06-20 | Discharge: 2015-06-20 | Disposition: A | Discharge status: Home / Self Care | Payer: Commercial Managed Care - HMO | Source: Ambulatory Visit | Attending: Internal Medicine | Admitting: Internal Medicine

## 2015-06-20 ENCOUNTER — Other Ambulatory Visit: Payer: Commercial Managed Care - HMO | Admitting: Internal Medicine

## 2015-06-20 VITALS — BP 100/74 | HR 62 | Temp 97.9°F | Ht 75.0 in | Wt 146.0 lb

## 2015-06-20 DIAGNOSIS — I4891 Unspecified atrial fibrillation: Secondary | ICD-10-CM | POA: Diagnosis not present

## 2015-06-20 DIAGNOSIS — N183 Chronic kidney disease, stage 3 (moderate): Secondary | ICD-10-CM | POA: Diagnosis not present

## 2015-06-20 DIAGNOSIS — R9431 Abnormal electrocardiogram [ECG] [EKG]: Secondary | ICD-10-CM | POA: Insufficient documentation

## 2015-06-20 DIAGNOSIS — N179 Acute kidney failure, unspecified: Secondary | ICD-10-CM | POA: Diagnosis not present

## 2015-06-20 DIAGNOSIS — E875 Hyperkalemia: Secondary | ICD-10-CM

## 2015-06-20 DIAGNOSIS — N189 Chronic kidney disease, unspecified: Principal | ICD-10-CM

## 2015-06-20 LAB — BASIC METABOLIC PANEL
ANION GAP: 8 (ref 5–15)
BUN: 28 mg/dL — ABNORMAL HIGH (ref 6–20)
CHLORIDE: 114 mmol/L — AB (ref 101–111)
CO2: 24 mmol/L (ref 22–32)
Calcium: 9.6 mg/dL (ref 8.9–10.3)
Creatinine, Ser: 2.12 mg/dL — ABNORMAL HIGH (ref 0.61–1.24)
GFR calc Af Amer: 34 mL/min — ABNORMAL LOW (ref >60)
GFR, EST NON AFRICAN AMERICAN: 30 mL/min — AB (ref >60)
Glucose, Bld: 113 mg/dL — ABNORMAL HIGH (ref 65–99)
Potassium: 5.7 mmol/L — ABNORMAL HIGH (ref 3.5–5.1)
Sodium: 146 mmol/L — ABNORMAL HIGH (ref 135–145)

## 2015-06-20 NOTE — Patient Instructions (Signed)
1. We will get you scheduled for the ultrasound of your kidneys and refer you back to Dr. Clover Mealy.  I will call you if there are problems with your labs.  Please stop the high potassium foods we talked about (including Ensure) and come back in 1 week to recheck labs.   2. Please take all medications as prescribed.    3. If you have worsening of your symptoms or new symptoms arise, please call the clinic [phone] or go to the ER immediately if symptoms are severe.  Food Basics for Chronic Kidney Disease When your kidneys are not working well, they cannot remove waste and excess substances from your blood as effectively as they did before. This can lead to a buildup and imbalance of these substances, which can affect how your body functions. This buildup can also make your kidneys work harder, causing even more damage. You may need to eat less of certain foods that can lead to the buildup of these substances in your body. By making the changes to your diet that are recommended by your dietitian or health care provider, you could possibly help prevent further kidney damage and delay or prevent the need for dialysis. The following information can help give you a basic understanding of these substances and how they affect your bodily functions. The information also gives examples of foods that contain the highest amounts of these substances. WHAT DO I NEED TO KNOW ABOUT SUBSTANCES IN MY FOOD THAT I MAY NEED TO ADJUST? Food adjustments will be different for each person with chronic kidney disease. It is important that you see a dietitian who can help you determine the specific adjustments that you will need to make for each of the following substances: Potassium Potassium affects how steadily your heart beats. If too much potassium builds up in your blood, it can cause an irregular heartbeat or even a heart attack. Examples of foods rich in potassium  include:  Milk.  Fruits.  Vegetables. Phosphorus Phosphorus is a mineral found in your bones. A balance between calcium and phosphorous is needed to build and maintain healthy bones. Too much phosphorus pulls calcium from your bones. This can make your bones weak and more likely to break. Too much phosphorus can also make your skin itch. Examples of foods rich in phosphorus include:  Milk and cheese.  Dried beans.  Peas.  Colas.  Nuts and peanut butter. Animal Protein Animal protein helps you make and keep muscle. It also helps in the repair of your body's cells and tissues. One of the natural breakdown products of protein is a waste product called urea. When your kidneys are not working properly, they cannot remove wastes such as urea like they did before you developed chronic kidney disease. You will likely need to limit the amount of protein you eat to help prevent a buildup of urea in your blood. Examples of animal protein include:  Meat (all types).  Fish and seafood.  Poultry.  Eggs. Sodium Sodium, which is found in salt, helps maintain a healthy balance of fluids in your body. Too much sodium can increase your blood pressure level and have a negative affect on the function of your heart and lungs. Too much sodium also can cause your body to retain too much fluid, making your kidneys work harder. Examples of foods with high levels of sodium include:  Salt seasonings.  Soy sauce.  Cured and processed meats.  Salted crackers and snack foods.  Fast food.  Canned  soups and most canned foods. Glucose Glucose provides energy for your body. If you have diabetes mellitus that is not properly controlled, you have too much glucose in your blood. Too much glucose in your blood can worsen the function of your kidneys by damaging small blood vessels. This prevents enough blood flow to your kidneys to give them what they need to work. If you have diabetes mellitus and chronic  kidney disease, it is important to maintain your blood glucose at a level recommended by your health care provider. SHOULD I TAKE A VITAMIN AND MINERAL SUPPLEMENT? Because you may need to avoid eating certain foods, you may not get all of the vitamins and minerals that would normally come from those foods. Your health care provider or dietitian may recommend that you take a supplement to ensure that you get all of the vitamins and minerals that your body needs.    This information is not intended to replace advice given to you by your health care provider. Make sure you discuss any questions you have with your health care provider.   Document Released: 07/27/2002 Document Revised: 05/27/2014 Document Reviewed: 04/02/2013 Elsevier Interactive Patient Education [employer].

## 2015-06-20 NOTE — Addendum Note (Signed)
Addended by: Orson Gear on: 06/20/2015 02:13 PM   Modules accepted: Orders

## 2015-06-21 LAB — URINALYSIS, ROUTINE W REFLEX MICROSCOPIC
Bilirubin, UA: NEGATIVE
GLUCOSE, UA: NEGATIVE
Ketones, UA: NEGATIVE
Leukocytes, UA: NEGATIVE
Nitrite, UA: NEGATIVE
RBC, UA: NEGATIVE
Specific Gravity, UA: 1.021 (ref 1.005–1.030)
Urobilinogen, Ur: 1 mg/dL (ref 0.2–1.0)
pH, UA: 5.5 (ref 5.0–7.5)

## 2015-06-21 NOTE — Progress Notes (Signed)
Internal Medicine Clinic Attending  Case discussed with Dr. Wilson soon after the resident saw the patient.  We reviewed the resident's history and exam and pertinent patient test results.  I agree with the assessment, diagnosis, and plan of care documented in the resident's note.  

## 2015-06-22 DIAGNOSIS — J129 Viral pneumonia, unspecified: Secondary | ICD-10-CM | POA: Diagnosis not present

## 2015-06-22 DIAGNOSIS — N19 Unspecified kidney failure: Secondary | ICD-10-CM | POA: Diagnosis not present

## 2015-06-22 DIAGNOSIS — I4891 Unspecified atrial fibrillation: Secondary | ICD-10-CM | POA: Diagnosis not present

## 2015-06-22 DIAGNOSIS — J449 Chronic obstructive pulmonary disease, unspecified: Secondary | ICD-10-CM | POA: Diagnosis not present

## 2015-06-22 NOTE — Progress Notes (Signed)
Subjective:    Patient ID: [name], male    DOB: 1946-04-10, 70 y.o.   MRN: [id number]  HPI Comments: [name] is a 70 year old man with PMH as below here for follow-up of his AKI on CKD and hyperkalemia.  This was an abbreviated visit for labs, EKG and to arrange renal ultrasound.  He says he is feeling a little better today.  He drank the Ensure that he was provided and appetite has improved a little.  He asked for a lunch tray while he waited for lab results and ate almost all of it.  He looks improved from when I saw him last week.      Past Medical History  Diagnosis Date  . Hypertension   . Chronic systolic heart failure ([place])     a. NICM - EF 35% with normal cors 2003. b. Last echo 11/2012 - EF 25-30%.  . Depression   . GERD (gastroesophageal reflux disease)   . Portal vein thrombosis   . Avascular necrosis of bones of both hips ([facility])     Status post bilateral hip replacements  . Gastritis   . Alcohol abuse   . Erectile dysfunction   . Bipolar disorder ([place])   . Hydrocele, unspecified   . Lung nodule     Chest CT scan on 12/14/2008 showed a nodular opacity at the left lung base felt to most likely represent scarring.  Follow-up chest CT scan on 06/02/2010 showed parenchymal scarring in the left apex, left  lower lobe, and lingula; some of this scarring at the left base had a nodular appearance, unchanged. Chest 09/2012 - stable.  . 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  Thyroid scan 08/28/2009 was normal with no focal areas of abnormal increased or decreased activity seen; the uptake of I 131 sodium iodide at 24 hours was 5.7%.  TSH and free T4 normalized by 08/16/2009.  . Intestinal obstruction ([place])   . COPD (chronic obstructive pulmonary disease) ([place])   . E coli bacteremia   . DVT (deep venous thrombosis) ([place])     He has hypercoagulability with multiple prior DVTs  . Pleural plaque     H/o asbestos exposure. Chest CT on  06/02/2010 showed stable extensive calcified pleural plaques involving the left hemithorax, consistent with asbestos related pleural disease.  . Weight loss     Normal colonoscopy by Dr. Olevia Perches on 11/06/2010.  . Tachycardia-bradycardia syndrome [facility])     a. 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 b. St. Jude gen change 2010.  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  . Noncompliance   . Thrombocytopenia ([place])   . Osteoarthritis   . Spondylosis   . Anxiety   . CKD (chronic kidney disease)     Renal U/S 12/04/2009 showed no pathological findings. Labs 12/04/2009 include normal ESR, C3, C4; neg ANA; SPEP showed nonspecific increase in the alpha-2 region with no M-spike; UPEP showed no monoclonal free light chains; urine IFE showed polyclonal increase in feree Kappa and/or free Lambda light chains. Baseline Cr reported 1.7-2.5.  . STEMI (ST elevation myocardial infarction) ([place]) 10/11/2014  . Hyperlipemia    Current Outpatient Prescriptions on File Prior to Visit  Medication Sig Dispense Refill  . albuterol (VENTOLIN HFA) 108 (90 BASE) MCG/ACT inhaler Inhale 2 puffs into the lungs every 6 (six) hours as needed for  wheezing or shortness of breath. 3 Inhaler 3  . apixaban (ELIQUIS) 5 MG TABS tablet Take 1 tablet (5 mg total) by mouth 2 (two) times daily. 180 tablet 3  . aspirin 81 MG chewable tablet Chew 1 tablet (81 mg total) by mouth daily. 30 tablet 12  . bisacodyl (DULCOLAX) 10 MG suppository Place 1 suppository (10 mg total) rectally daily as needed for severe constipation. 12 suppository 5  . buPROPion (WELLBUTRIN) 75 MG tablet Take 1 tablet (75 mg total) by mouth 2 (two) times daily. 180 tablet 3  . digoxin (LANOXIN) 0.125 MG tablet Take 0.5 tablets (0.0625 mg total) by mouth daily. 45 tablet 3  . diltiazem (CARDIZEM CD) 120 MG 24 hr capsule Take 1 capsule (120 mg total) by mouth daily. 90  capsule 3  . diphenhydrAMINE (BENADRYL) 2 % cream Apply 1 application topically 3 (three) times daily as needed for itching. Reported on 06/16/2015    . feeding supplement, ENSURE ENLIVE, (ENSURE ENLIVE) LIQD Take 237 mLs by mouth 2 (two) times daily between meals. (Patient not taking: Reported on 06/16/2015) 237 mL 12  . gabapentin (NEURONTIN) 300 MG capsule TAKE ONE CAPSULE BY MOUTH 3 TIMES A DAY 60 capsule 2  . metoprolol (TOPROL-XL) 200 MG 24 hr tablet Take 1 tablet (200 mg total) by mouth daily. Take with or immediately following a meal. 30 tablet 11  . mirtazapine (REMERON) 15 MG tablet Take 1 tablet (15 mg total) by mouth at bedtime. 30 tablet 2  . omeprazole (PRILOSEC) 40 MG capsule Take 1 capsule (40 mg total) by mouth daily. 90 capsule 3  . oxyCODONE-acetaminophen (PERCOCET) 10-325 MG tablet Take 1 tablet by mouth every 4 (four) hours as needed for pain. Do not take more than 5 doses in a 24 hour period. 150 tablet 0  . rosuvastatin (CRESTOR) 20 MG tablet Take 1 tablet (20 mg total) by mouth daily at 6 PM. 90 tablet 3  . senna-docusate ([employer]S SENNA PLUS) 8.6-50 MG per tablet Take 2 tablets by mouth 2 (two) times daily. 360 tablet 3  . tiotropium (SPIRIVA) 18 MCG inhalation capsule Place 1 capsule (18 mcg total) into inhaler and inhale daily. 90 capsule 3  . Urea 37.5 % CREA Apply 1 application topically daily. (Patient not taking: Reported on 05/03/2015) 142 g 0   No current facility-administered medications on file prior to visit.   Review of Systems  Constitutional: Negative for appetite change.  Cardiovascular: Negative for chest pain and palpitations.  Genitourinary: Negative for dysuria, hematuria, flank pain and difficulty urinating.       + foamy urine       Filed Vitals:   06/20/15 1456  BP: 100/74  Pulse: 62  Temp: 97.9 F (36.6 C)  TempSrc: Oral  Height: '6\' 3"'  (1.905 m)  Weight: 146 lb (66.225 kg)  SpO2: 100%    Objective:   Physical Exam  Constitutional: He is  oriented to person, place, and time. He appears well-developed. No distress.  Neurological: He is alert and oriented to person, place, and time.  Skin: Skin is warm. He is not diaphoretic.  Psychiatric: He has a normal mood and affect. His behavior is normal. Judgment and thought content normal.  Vitals reviewed.         Assessment & Plan:  Please see problem based charting for A&P.

## 2015-06-25 ENCOUNTER — Other Ambulatory Visit: Payer: Self-pay | Admitting: Internal Medicine

## 2015-06-25 DIAGNOSIS — N189 Chronic kidney disease, unspecified: Principal | ICD-10-CM

## 2015-06-25 DIAGNOSIS — N179 Acute kidney failure, unspecified: Secondary | ICD-10-CM | POA: Insufficient documentation

## 2015-06-25 NOTE — Assessment & Plan Note (Addendum)
Assessment:  This was a lab visit to recheck Mr. Kyllonen's AKI on CKD3 noted on labs drawn during our 06/16/15 office visit.  Cr had increased from baseline 1.5-1.6 to 2.6.  Today Cr is improving, down to 2.1 however, K has increased from 5.70 yo 5.7.  He admits to foamy urine but no decreased urination or other urinary complaints.  His appetite seems to be improving as he ate nearly a full lunch tray and drank a few beverages.  I would expect improvement in K with improving renal function however he was provided with Ensure at our last visit which does have K in it.  This along with poor renal function likely contributing to the elevation.  There are no EKG changes, T waves taller in V3 but otherwise no peaking (I would expect peaked T throughout if heart affected) and QRS is normal at 55ms, so no indication for treatment.  Will not give kayexalate to avoid GI ADRs/diarrhea in this gentleman who has poor nutritional status already and is just starting to have some improvement in intake. Plan:  He was provided with a list of high potassium foods to avoid until he comes in for repeat labs next week.  Despite reported poor appetite last week, he seemed rather disappointed to have to avoid several foods on the list, including bananas, which he says he loves. He was also encouraged to maintain hydration and to STOP Ensure (because of K content) but he can try an OTC supplement shake formulated for people with CKD.  Urinalysis was completed today.  I have referred him for renal ultrasound and back to [facility] for follow-up.

## 2015-06-25 NOTE — Progress Notes (Signed)
Case discussed with Dr. Wilson at the time of the visit.  We reviewed the resident's history and exam and pertinent patient test results.  I agree with the assessment, diagnosis, and plan of care documented in the resident's note. 

## 2015-06-27 ENCOUNTER — Ambulatory Visit (INDEPENDENT_AMBULATORY_CARE_PROVIDER_SITE_OTHER): Payer: Commercial Managed Care - HMO | Admitting: Internal Medicine

## 2015-06-27 ENCOUNTER — Telehealth: Payer: Self-pay | Admitting: Internal Medicine

## 2015-06-27 ENCOUNTER — Telehealth: Payer: Self-pay | Admitting: *Deleted

## 2015-06-27 VITALS — BP 116/78 | HR 62 | Temp 97.6°F | Ht 75.0 in | Wt 148.7 lb

## 2015-06-27 DIAGNOSIS — N179 Acute kidney failure, unspecified: Secondary | ICD-10-CM | POA: Diagnosis not present

## 2015-06-27 DIAGNOSIS — N189 Chronic kidney disease, unspecified: Secondary | ICD-10-CM

## 2015-06-27 DIAGNOSIS — Z596 Low income: Secondary | ICD-10-CM

## 2015-06-27 DIAGNOSIS — I129 Hypertensive chronic kidney disease with stage 1 through stage 4 chronic kidney disease, or unspecified chronic kidney disease: Secondary | ICD-10-CM

## 2015-06-27 DIAGNOSIS — N183 Chronic kidney disease, stage 3 (moderate): Secondary | ICD-10-CM | POA: Diagnosis not present

## 2015-06-27 DIAGNOSIS — E875 Hyperkalemia: Secondary | ICD-10-CM

## 2015-06-27 DIAGNOSIS — I10 Essential (primary) hypertension: Secondary | ICD-10-CM

## 2015-06-27 DIAGNOSIS — M25559 Pain in unspecified hip: Secondary | ICD-10-CM

## 2015-06-27 DIAGNOSIS — Z79891 Long term (current) use of opiate analgesic: Secondary | ICD-10-CM | POA: Insufficient documentation

## 2015-06-27 DIAGNOSIS — M545 Low back pain: Secondary | ICD-10-CM

## 2015-06-27 DIAGNOSIS — G8929 Other chronic pain: Secondary | ICD-10-CM

## 2015-06-27 DIAGNOSIS — M25569 Pain in unspecified knee: Secondary | ICD-10-CM

## 2015-06-27 MED ORDER — NALOXONE HCL 0.4 MG/0.4ML IJ SOAJ
0.4000 mL | INTRAMUSCULAR | Status: DC | PRN
Start: 2015-06-27 — End: 2015-06-28

## 2015-06-27 MED ORDER — OXYCODONE-ACETAMINOPHEN 10-325 MG PO TABS: 1.0000 | ORAL_TABLET | ORAL | Status: DC | PRN

## 2015-06-27 NOTE — Assessment & Plan Note (Signed)
A; Essential HTN, at goal  P:  -Stable continue current therapy

## 2015-06-27 NOTE — Patient Instructions (Signed)
General Instructions:  I am going to check your kidney function today, please keep your appointments with the kidney doctor and for your renal ultrasound.  Let me know if you are having trouble obtaining the Naloxone medication and I will put you in touch with Dr Maudie Mercury.  Please bring your medicines with you each time you come to clinic.  Medicines may include prescription medications, over-the-counter medications, herbal remedies, eye drops, vitamins, or other pills.   Progress Toward Treatment Goals:  Treatment Goal 08/10/2014  Blood pressure at goal  Stop smoking -  Prevent falls unchanged    Self Care Goals & Plans:  Self Care Goal 06/27/2015  Manage my medications take my medicines as prescribed; bring my medications to every visit; refill my medications on time  Monitor my health keep track of my blood pressure  Eat healthy foods drink diet soda or water instead of juice or soda; eat more vegetables; eat foods that are low in salt; eat baked foods instead of fried foods; eat fruit for snacks and desserts  Be physically active find an activity I enjoy  Meeting treatment goals maintain the current self-care plan    No flowsheet data found.   Care Management & Community Referrals:  Referral 08/10/2014  Referrals made for care management support nutritionist  Referrals made to community resources none

## 2015-06-27 NOTE — Telephone Encounter (Signed)
Patient needs pain medication refill

## 2015-06-27 NOTE — Telephone Encounter (Signed)
Call from patient prescription for Naloxone is not covered by his insurance.  Will cost $400.00.  Patient unable to afford.  Sander Nephew, RN 06/27/2015  3:56 PM

## 2015-06-27 NOTE — Telephone Encounter (Signed)
done

## 2015-06-27 NOTE — Assessment & Plan Note (Addendum)
HPI: HE takes percocet and Neurontin for his chronic back, knee and hip pains.  His previous pain contract was signed in 2016 with his former PCP Dr Marinda Elk.  He currently reports 8/10 pain predominately in his low back.  He reports that percocet helps the pain and he last took it this morning.  He does have occasional radiation of his pain down his legs.  He needs to refill gabapentin and has not taken in the last few days.   A: Chronic Pain  P: -He is on roughly 75 MME daily. - He needs a new pain contract signed with his new PCP Dr. [name].  Will have him follow up within the next month for that. - Check UDS today should be positive for oxycodone. prescription given for Narcan

## 2015-06-27 NOTE — Progress Notes (Signed)
[facility] Subjective:   Patient ID: [name] male   DOB: 22-May-1945 70 y.o.   MRN: [id number]  HPI: [name] is a 70 y.o. male with a PMH detailed below who presents for 1 week follow up of acute on chronic kidney disease.  Please see problem based charting below for the status of his chronic medical issues.    Past Medical History  Diagnosis Date  . Hypertension   . Chronic systolic heart failure ([place])     a. NICM - EF 35% with normal cors 2003. b. Last echo 11/2012 - EF 25-30%.  . Depression   . GERD (gastroesophageal reflux disease)   . Portal vein thrombosis   . Avascular necrosis of bones of both hips ([facility])     Status post bilateral hip replacements  . Gastritis   . Alcohol abuse   . Erectile dysfunction   . Bipolar disorder ([place])   . Hydrocele, unspecified   . Lung nodule     Chest CT scan on 12/14/2008 showed a nodular opacity at the left lung base felt to most likely represent scarring.  Follow-up chest CT scan on 06/02/2010 showed parenchymal scarring in the left apex, left  lower lobe, and lingula; some of this scarring at the left base had a nodular appearance, unchanged. Chest 09/2012 - stable.  . 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  Thyroid scan 08/28/2009 was normal with no focal areas of abnormal increased or decreased activity seen; the uptake of I 131 sodium iodide at 24 hours was 5.7%.  TSH and free T4 normalized by 08/16/2009.  . Intestinal obstruction ([place])   . COPD (chronic obstructive pulmonary disease) ([place])   . E coli bacteremia   . DVT (deep venous thrombosis) ([place])     He has hypercoagulability with multiple prior DVTs  . Pleural plaque     H/o asbestos exposure. Chest CT on 06/02/2010 showed stable extensive calcified pleural plaques involving the left hemithorax, consistent with asbestos related pleural disease.  . Weight loss     Normal colonoscopy by Dr. Olevia Perches on  11/06/2010.  . Tachycardia-bradycardia syndrome [facility])     a. 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 b. St. Jude gen change 2010.  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  . Noncompliance   . Thrombocytopenia ([place])   . Osteoarthritis   . Spondylosis   . Anxiety   . CKD (chronic kidney disease)     Renal U/S 12/04/2009 showed no pathological findings. Labs 12/04/2009 include normal ESR, C3, C4; neg ANA; SPEP showed nonspecific increase in the alpha-2 region with no M-spike; UPEP showed no monoclonal free light chains; urine IFE showed polyclonal increase in feree Kappa and/or free Lambda light chains. Baseline Cr reported 1.7-2.5.  . STEMI (ST elevation myocardial infarction) ([place]) 10/11/2014  . Hyperlipemia    Current Outpatient Prescriptions  Medication Sig Dispense Refill  . albuterol (VENTOLIN HFA) 108 (90 BASE) MCG/ACT inhaler Inhale 2 puffs into the lungs every 6 (six) hours as needed for wheezing or shortness of breath. 3 Inhaler 3  . apixaban (ELIQUIS) 5 MG TABS tablet Take 1 tablet (5 mg total) by mouth 2 (two) times daily. 180 tablet 3  . aspirin 81 MG chewable tablet Chew 1 tablet (81 mg total) by mouth daily. 30 tablet 12  . bisacodyl (DULCOLAX) 10 MG  suppository Place 1 suppository (10 mg total) rectally daily as needed for severe constipation. 12 suppository 5  . buPROPion (WELLBUTRIN) 75 MG tablet Take 1 tablet (75 mg total) by mouth 2 (two) times daily. 180 tablet 3  . digoxin (LANOXIN) 0.125 MG tablet Take 0.5 tablets (0.0625 mg total) by mouth daily. 45 tablet 3  . diltiazem (CARDIZEM CD) 120 MG 24 hr capsule Take 1 capsule (120 mg total) by mouth daily. 90 capsule 3  . diphenhydrAMINE (BENADRYL) 2 % cream Apply 1 application topically 3 (three) times daily as needed for itching. Reported on 06/16/2015    . feeding supplement, ENSURE ENLIVE, (ENSURE ENLIVE) LIQD Take 237 mLs by  mouth 2 (two) times daily between meals. (Patient not taking: Reported on 06/16/2015) 237 mL 12  . gabapentin (NEURONTIN) 300 MG capsule TAKE ONE CAPSULE BY MOUTH 3 TIMES A DAY 60 capsule 2  . metoprolol (TOPROL-XL) 200 MG 24 hr tablet Take 1 tablet (200 mg total) by mouth daily. Take with or immediately following a meal. 30 tablet 11  . mirtazapine (REMERON) 15 MG tablet Take 1 tablet (15 mg total) by mouth at bedtime. 30 tablet 2  . Naloxone HCl (EVZIO) 0.4 MG/0.4ML SOAJ Inject 0.4 mLs as directed as needed (opioid overdose). 1 Package 1  . omeprazole (PRILOSEC) 40 MG capsule Take 1 capsule (40 mg total) by mouth daily. 90 capsule 3  . oxyCODONE-acetaminophen (PERCOCET) 10-325 MG tablet Take 1 tablet by mouth every 4 (four) hours as needed for pain. Do not take more than 5 doses in a 24 hour period. 150 tablet 0  . rosuvastatin (CRESTOR) 20 MG tablet Take 1 tablet (20 mg total) by mouth daily at 6 PM. 90 tablet 3  . senna-docusate ([employer] SENNA PLUS) 8.6-50 MG per tablet Take 2 tablets by mouth 2 (two) times daily. 360 tablet 3  . tiotropium (SPIRIVA) 18 MCG inhalation capsule Place 1 capsule (18 mcg total) into inhaler and inhale daily. 90 capsule 3  . Urea 37.5 % CREA Apply 1 application topically daily. (Patient not taking: Reported on 05/03/2015) 142 g 0   No current facility-administered medications for this visit.   Family History  Problem Relation Age of Onset  . Hypertension Mother   . Cancer Brother     Unsure of type.  . Asthma Mother   . Coronary artery disease Mother   . Colon cancer Neg Hx   . Lung cancer Neg Hx   . Prostate cancer Neg Hx   . Stroke      mother  . Heart attack      brother and sister ?age    Social History   Social History  . Marital Status: Widowed    Spouse Name: N/A  . Number of Children: 10  . Years of Education: N/A   Occupational History  . RETIRED    Social History Main Topics  . Smoking status: Former Smoker -- 0.33 packs/day for 54 years     Types: Cigarettes    Quit date: 10/11/2014  . Smokeless tobacco: Never Used  . Alcohol Use: No     Comment: 6/22//2016 "h/o etoh abuse; stopped drinking ~ 2012"  . Drug Use: No     Comment: 11/09/2014 "none since 10/11/2014; did smoke marijuana ~ 1 time/month"  . Sexual Activity: Not Currently   Other Topics Concern  . Not on file   Social History Narrative   Works as a Architect part time   Smoker 1 pack  last 1.5 days tobacco, smokes marijuana    Denies EtOH x 4 years (on 10/12/12)   9 kids    From [place]   [place] Veteran    12 grade education             Review of Systems: Review of Systems  Constitutional: Negative for fever and chills.  Genitourinary: Negative for frequency.  Musculoskeletal: Positive for back pain. Negative for falls.     Objective:  Physical Exam: Filed Vitals:   06/27/15 1435  BP: 116/78  Pulse: 62  Temp: 97.6 F (36.4 C)  TempSrc: Oral  Height: '6\' 3"'  (1.905 m)  Weight: 148 lb 11.2 oz (67.45 kg)  SpO2: 98%   Physical Exam  Constitutional: No distress.  In wheelchair  Cardiovascular: Normal rate and regular rhythm.   Pulmonary/Chest: Effort normal and breath sounds normal.  Musculoskeletal: He exhibits no edema.     Assessment & Plan:  Case discussed with Dr. Dareen Piano  Essential hypertension A; Essential HTN, at goal  P:  -Stable continue current therapy  Acute-on-chronic kidney injury ([place]) HPI: HE was seen 1/27 and then again 1/31 with decreased appeitite and found to have AoC CKD that improved some on the 31st labs.  He was referred to Nephrology and an U/S was ordered.  The U/S has yet to be completed.  A: Acute on chronic kidney injury, CKD Stage 3, Hyperkalemia  P: -Repeat BMP today - Encouraged patient to obtain U/S.  Chronic pain HPI: HE takes percocet and Neurontin for his chronic back, knee and hip pains.  His previous pain contract was signed in 2016 with his former PCP Dr Marinda Elk.  He currently reports 8/10  pain predominately in his low back.  He reports that percocet helps the pain and he last took it this morning.  He does have occasional radiation of his pain down his legs.  He needs to refill gabapentin and has not taken in the last few days.   A: Chronic Pain  P: -He is on roughly 75 MME daily. - He needs a new pain contract signed with his new PCP Dr. [name].  Will have him follow up within the next month for that. - Check UDS today should be positive for oxycodone. prescription given for Narcan     Medications Ordered Meds ordered this encounter  Medications  . Naloxone HCl (EVZIO) 0.4 MG/0.4ML SOAJ    Sig: Inject 0.4 mLs as directed as needed (opioid overdose).    Dispense:  1 Package    Refill:  1  . oxyCODONE-acetaminophen (PERCOCET) 10-325 MG tablet    Sig: Take 1 tablet by mouth every 4 (four) hours as needed for pain. Do not take more than 5 doses in a 24 hour period.    Dispense:  150 tablet    Refill:  0    Fill 30 days after last filled prescription.   Other Orders Orders Placed This Encounter  Procedures  . BMP8+Anion Gap  . Protein / creatinine ratio, urine  . ToxAssure Select,+Antidepr,UR   Follow Up: Return in about 4 weeks (around 07/25/2015), or 1 month with PCP Dr Zada Finders.

## 2015-06-27 NOTE — Assessment & Plan Note (Signed)
HPI: HE was seen 1/27 and then again 1/31 with decreased appeitite and found to have AoC CKD that improved some on the 31st labs.  He was referred to Nephrology and an U/S was ordered.  The U/S has yet to be completed.  A: Acute on chronic kidney injury, CKD Stage 3, Hyperkalemia  P: -Repeat BMP today - Encouraged patient to obtain U/S.

## 2015-06-28 LAB — BMP8+ANION GAP
ANION GAP: 20 mmol/L — AB (ref 10.0–18.0)
BUN/Creatinine Ratio: 13 (ref 10–22)
BUN: 31 mg/dL — ABNORMAL HIGH (ref 8–27)
CALCIUM: 9.5 mg/dL (ref 8.6–10.2)
CO2: 19 mmol/L (ref 18–29)
CREATININE: 2.34 mg/dL — AB (ref 0.76–1.27)
Chloride: 107 mmol/L — ABNORMAL HIGH (ref 96–106)
GFR calc Af Amer: 31 mL/min/{1.73_m2} — ABNORMAL LOW (ref >59)
GFR, EST NON AFRICAN AMERICAN: 27 mL/min/{1.73_m2} — AB (ref >59)
Glucose: 96 mg/dL (ref 65–99)
POTASSIUM: 5.9 mmol/L — AB (ref 3.5–5.2)
Sodium: 146 mmol/L — ABNORMAL HIGH (ref 134–144)

## 2015-06-28 LAB — PROTEIN / CREATININE RATIO, URINE
CREATININE, UR: 254.4 mg/dL
PROTEIN UR: 51 mg/dL
Protein/Creat Ratio: 200 mg/g creat (ref 0–200)

## 2015-06-28 MED ORDER — NALOXONE HCL 0.4 MG/0.4ML IJ SOAJ: 0.4000 mL | INTRAMUSCULAR | Status: DC | PRN

## 2015-06-28 MED FILL — *EVZIO 0.4 MG AUTO-INJECTOR: 0.4 | 30 days supply | Qty: 1 | Fill #0

## 2015-06-28 NOTE — Progress Notes (Signed)
Patient ID: [name], male   DOB: [birth year], 70 y.o.   MRN: [id number]  Help with patient access and education for naloxone. Will coordinate.

## 2015-06-28 NOTE — Telephone Encounter (Signed)
Thank You Regino Schultze, will forward to Dr Maudie Mercury to see if there are more affordable options

## 2015-06-28 NOTE — Addendum Note (Signed)
Addended by: Forde Dandy on: 06/28/2015 10:01 AM   Modules accepted: Orders

## 2015-07-01 LAB — TOXASSURE SELECT,+ANTIDEPR,UR: PDF: 0

## 2015-07-03 ENCOUNTER — Ambulatory Visit (HOSPITAL_COMMUNITY)
Admission: RE | Admit: 2015-07-03 | Discharge: 2015-07-03 | Disposition: A | Discharge status: Home / Self Care | Payer: Commercial Managed Care - HMO | Source: Ambulatory Visit | Attending: Internal Medicine | Admitting: Internal Medicine

## 2015-07-03 DIAGNOSIS — N179 Acute kidney failure, unspecified: Secondary | ICD-10-CM | POA: Diagnosis not present

## 2015-07-03 DIAGNOSIS — N189 Chronic kidney disease, unspecified: Secondary | ICD-10-CM | POA: Insufficient documentation

## 2015-07-03 NOTE — Progress Notes (Signed)
Internal Medicine Clinic Attending  Case discussed with Dr. Hoffman soon after the resident saw the patient.  We reviewed the resident's history and exam and pertinent patient test results.  I agree with the assessment, diagnosis, and plan of care documented in the resident's note. 

## 2015-07-06 DIAGNOSIS — N400 Enlarged prostate without lower urinary tract symptoms: Secondary | ICD-10-CM | POA: Diagnosis not present

## 2015-07-06 DIAGNOSIS — I428 Other cardiomyopathies: Secondary | ICD-10-CM | POA: Diagnosis not present

## 2015-07-06 DIAGNOSIS — N183 Chronic kidney disease, stage 3 (moderate): Secondary | ICD-10-CM | POA: Diagnosis not present

## 2015-07-06 DIAGNOSIS — E875 Hyperkalemia: Secondary | ICD-10-CM | POA: Diagnosis not present

## 2015-07-06 DIAGNOSIS — R634 Abnormal weight loss: Secondary | ICD-10-CM | POA: Diagnosis not present

## 2015-07-10 ENCOUNTER — Encounter: Payer: Commercial Managed Care - HMO | Admitting: Internal Medicine

## 2015-07-11 ENCOUNTER — Ambulatory Visit (INDEPENDENT_AMBULATORY_CARE_PROVIDER_SITE_OTHER): Payer: Commercial Managed Care - HMO | Admitting: Podiatry

## 2015-07-11 ENCOUNTER — Encounter: Payer: Self-pay | Admitting: Podiatry

## 2015-07-11 DIAGNOSIS — M79676 Pain in unspecified toe(s): Secondary | ICD-10-CM

## 2015-07-11 DIAGNOSIS — B351 Tinea unguium: Secondary | ICD-10-CM | POA: Diagnosis not present

## 2015-07-11 DIAGNOSIS — Q828 Other specified congenital malformations of skin: Secondary | ICD-10-CM

## 2015-07-11 DIAGNOSIS — I739 Peripheral vascular disease, unspecified: Secondary | ICD-10-CM

## 2015-07-11 NOTE — Progress Notes (Signed)
Subjective:     Patient ID: [name], male   DOB: February 17, 1946, 70 y.o.   MRN: [id number]  HPI 70 year old patient presents to the office with continued severe pain in the callus  on his left heel.  He states that the pain in the heel is extremely painful and was to debrided  too  low at his last visit, causing him severe pain at that time. He presents the office today stating that the callus has returned on the left heel and he desires an evaluation and treatment. This callus becomes painful with pressure and weightbearing on his left heel. He also says that he has a blackened area noted on the tip of the big toe of the right foot that has become painful and sore over time. No drainage has  been noted from the site. He also has a painful corn on the fourth toe left foot, which is painful as he walks and wears his shoes. Finally, his nails have become thick long and painful and he presents for preventative foot care services at this time   Review of Systems     Objective:   Physical Exam GENERAL APPEARANCE: Alert, conversant. Appropriately groomed. No acute distress.  VASCULAR: Pedal pulses are palpable but diminished palpable at  [facility] and PT bilateral.  Capillary refill time is immediate to all digits,  Cold feet noted.[name]   NEUROLOGIC: sensation is diminished to 5.07 monofilament at 5/5 sites bilateral.  Light touch is intact bilateral, Muscle strength normal.  MUSCULOSKELETAL: acceptable muscle strength, tone and stability bilateral.  Intrinsic muscluature intact bilateral.  Digital contractures digits B/L especially fourth toe left foot.   DERMATOLOGIC: skin color, texture, and turgor are within normal limits.  No preulcerative lesions or ulcers  are seen, no interdigital maceration noted.  No open lesions present. . No drainage noted. There is distal clavi fourth toe left foot.  There is callus distal aspect great toe right foot with no infection or drainage. Severe pre-ulcerous callus in center of  callus left heel.  No redness or swelling or infection. Callus noted right hallux.   No open lesions or drainage.  NAILS  Thick disfigured discolored nails both feet.       Assessment:     Pre-ulcerous lesion left heel.  Callus B/L  Onychomycosis B/L     Plan:     Debridement of callus B/L.  Applied neosporin/DSD left heel with bandage.  Applied adhesive foam to his left insole left heel.  Debridement of nails B/L   Gardiner Barefoot DPM

## 2015-07-19 ENCOUNTER — Encounter: Payer: Self-pay | Admitting: Internal Medicine

## 2015-07-19 ENCOUNTER — Ambulatory Visit: Payer: Commercial Managed Care - HMO | Admitting: Pharmacist

## 2015-07-19 ENCOUNTER — Ambulatory Visit (INDEPENDENT_AMBULATORY_CARE_PROVIDER_SITE_OTHER): Payer: Commercial Managed Care - HMO | Admitting: Internal Medicine

## 2015-07-19 VITALS — BP 102/65 | HR 65 | Temp 98.1°F | Ht 75.0 in | Wt 165.3 lb

## 2015-07-19 DIAGNOSIS — F32A Depression, unspecified: Secondary | ICD-10-CM

## 2015-07-19 DIAGNOSIS — Z719 Counseling, unspecified: Secondary | ICD-10-CM

## 2015-07-19 DIAGNOSIS — J449 Chronic obstructive pulmonary disease, unspecified: Secondary | ICD-10-CM | POA: Diagnosis not present

## 2015-07-19 DIAGNOSIS — Z79891 Long term (current) use of opiate analgesic: Secondary | ICD-10-CM

## 2015-07-19 DIAGNOSIS — I5042 Chronic combined systolic (congestive) and diastolic (congestive) heart failure: Secondary | ICD-10-CM | POA: Diagnosis not present

## 2015-07-19 DIAGNOSIS — N183 Chronic kidney disease, stage 3 unspecified: Secondary | ICD-10-CM

## 2015-07-19 DIAGNOSIS — Z7951 Long term (current) use of inhaled steroids: Secondary | ICD-10-CM

## 2015-07-19 DIAGNOSIS — I130 Hypertensive heart and chronic kidney disease with heart failure and stage 1 through stage 4 chronic kidney disease, or unspecified chronic kidney disease: Secondary | ICD-10-CM | POA: Diagnosis not present

## 2015-07-19 DIAGNOSIS — I110 Hypertensive heart disease with heart failure: Secondary | ICD-10-CM | POA: Diagnosis not present

## 2015-07-19 DIAGNOSIS — J438 Other emphysema: Secondary | ICD-10-CM

## 2015-07-19 DIAGNOSIS — I10 Essential (primary) hypertension: Secondary | ICD-10-CM

## 2015-07-19 DIAGNOSIS — F329 Major depressive disorder, single episode, unspecified: Secondary | ICD-10-CM

## 2015-07-19 DIAGNOSIS — Z79899 Other long term (current) drug therapy: Secondary | ICD-10-CM

## 2015-07-19 LAB — BRAIN NATRIURETIC PEPTIDE: B Natriuretic Peptide: 202.9 pg/mL — ABNORMAL HIGH (ref 0.0–100.0)

## 2015-07-19 MED ORDER — OXYCODONE-ACETAMINOPHEN 10-325 MG PO TABS: 1.0000 | ORAL_TABLET | ORAL | Status: DC | PRN

## 2015-07-19 NOTE — Progress Notes (Signed)
Medicine attending: Medical history, presenting problems, physical findings, and medications, reviewed with resident physician Dr Zada Finders on the day of the patient visit and I concur with his evaluation and management plan. Loss of appetite. 20 lb wgt loss at time of 2/16 visit w Nephrology. Base wgt not clear. 170 per renal, 150 per our clinic notes. He was started on high dose Megace to stimulate appetite which worked. Weight now 165 but I am concerned with his GFR of 31 ml/min & hx of CHF that this is fluid weight given only 2 week interval since starting Megace. In addition, this dose of Megace, although recommended as appetite stimulant, can cause significant fluid retention and is thrombogenic. I am decreasing to 80 mg BID which is usually sufficient to stimulate appetite, instead of 800 mg BID. Checking a BNP.

## 2015-07-19 NOTE — Progress Notes (Signed)
Evzio was provided and reviewed with the patient, including name, instructions, indication, goals of therapy, potential side effects, importance of adherence, and safe use.  Patient verbalized understanding by repeating back information and was advised to contact me if further medication-related questions arise. Patient was also provided an information handout.

## 2015-07-19 NOTE — Progress Notes (Signed)
Patient ID: [name], male   DOB: 06-Sep-1945, 70 y.o.   MRN: [id number]   Subjective:   Patient ID: [name] male   DOB: 1945-11-04 70 y.o.   MRN: [id number]  HPI: [name] is a 70 y.o. male with PMH as listed below who presents for management of his HTN, COPD, and chronic pain. Please see problem list for status of patient's chronic medical issues.    Past Medical History  Diagnosis Date  . Hypertension   . Chronic systolic heart failure ([place])     a. NICM - EF 35% with normal cors 2003. b. Last echo 11/2012 - EF 25-30%.  . Depression   . GERD (gastroesophageal reflux disease)   . Portal vein thrombosis   . Avascular necrosis of bones of both hips ([facility])     Status post bilateral hip replacements  . Gastritis   . Alcohol abuse   . Erectile dysfunction   . Bipolar disorder ([place])   . Hydrocele, unspecified   . Lung nodule     Chest CT scan on 12/14/2008 showed a nodular opacity at the left lung base felt to most likely represent scarring.  Follow-up chest CT scan on 06/02/2010 showed parenchymal scarring in the left apex, left  lower lobe, and lingula; some of this scarring at the left base had a nodular appearance, unchanged. Chest 09/2012 - stable.  . 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  Thyroid scan 08/28/2009 was normal with no focal areas of abnormal increased or decreased activity seen; the uptake of I 131 sodium iodide at 24 hours was 5.7%.  TSH and free T4 normalized by 08/16/2009.  . Intestinal obstruction ([place])   . COPD (chronic obstructive pulmonary disease) ([place])   . E coli bacteremia   . DVT (deep venous thrombosis) ([place])     He has hypercoagulability with multiple prior DVTs  . Pleural plaque     H/o asbestos exposure. Chest CT on 06/02/2010 showed stable extensive calcified pleural plaques involving the left hemithorax, consistent with asbestos related pleural disease.  . Weight loss     Normal colonoscopy  by Dr. Olevia Perches on 11/06/2010.  . Tachycardia-bradycardia syndrome [facility])     a. 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 b. St. Jude gen change 2010.  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  . Noncompliance   . Thrombocytopenia ([place])   . Osteoarthritis   . Spondylosis   . Anxiety   . CKD (chronic kidney disease)     Renal U/S 12/04/2009 showed no pathological findings. Labs 12/04/2009 include normal ESR, C3, C4; neg ANA; SPEP showed nonspecific increase in the alpha-2 region with no M-spike; UPEP showed no monoclonal free light chains; urine IFE showed polyclonal increase in feree Kappa and/or free Lambda light chains. Baseline Cr reported 1.7-2.5.  . STEMI (ST elevation myocardial infarction) ([place]) 10/11/2014  . Hyperlipemia    Current Outpatient Prescriptions  Medication Sig Dispense Refill  . albuterol (VENTOLIN HFA) 108 (90 BASE) MCG/ACT inhaler Inhale 2 puffs into the lungs every 6 (six) hours as needed for wheezing or shortness of breath. 3 Inhaler 3  . apixaban (ELIQUIS) 5 MG TABS tablet Take 1 tablet (5 mg total) by mouth 2 (two) times daily. 180 tablet 3  . aspirin 81 MG chewable tablet Chew 1 tablet (81 mg total) by mouth daily. 30 tablet  12  . buPROPion (WELLBUTRIN) 75 MG tablet Take 1 tablet (75 mg total) by mouth 2 (two) times daily. 180 tablet 3  . digoxin (LANOXIN) 0.125 MG tablet Take 0.5 tablets (0.0625 mg total) by mouth daily. 45 tablet 3  . diltiazem (CARDIZEM CD) 120 MG 24 hr capsule Take 1 capsule (120 mg total) by mouth daily. 90 capsule 3  . diphenhydrAMINE (BENADRYL) 2 % cream Apply 1 application topically 3 (three) times daily as needed for itching. Reported on 06/16/2015    . gabapentin (NEURONTIN) 300 MG capsule TAKE ONE CAPSULE BY MOUTH 3 TIMES A DAY 60 capsule 2  . megestrol (MEGACE) 40 MG/ML suspension Take 10 mg by mouth 2 (two) times daily.    . mirtazapine  (REMERON) 15 MG tablet Take 1 tablet (15 mg total) by mouth at bedtime. 30 tablet 2  . omeprazole (PRILOSEC) 40 MG capsule Take 1 capsule (40 mg total) by mouth daily. 90 capsule 3  . oxyCODONE-acetaminophen (PERCOCET) 10-325 MG tablet Take 1 tablet by mouth every 4 (four) hours as needed for pain. Do not take more than 5 doses in a 24 hour period. 150 tablet 0  . rosuvastatin (CRESTOR) 20 MG tablet Take 1 tablet (20 mg total) by mouth daily at 6 PM. 90 tablet 3  . senna-docusate ([employer] SENNA PLUS) 8.6-50 MG per tablet Take 2 tablets by mouth 2 (two) times daily. 360 tablet 3  . tiotropium (SPIRIVA) 18 MCG inhalation capsule Place 1 capsule (18 mcg total) into inhaler and inhale daily. 90 capsule 3  . bisacodyl (DULCOLAX) 10 MG suppository Place 1 suppository (10 mg total) rectally daily as needed for severe constipation. (Patient not taking: Reported on 07/19/2015) 12 suppository 5  . feeding supplement, ENSURE ENLIVE, (ENSURE ENLIVE) LIQD Take 237 mLs by mouth 2 (two) times daily between meals. (Patient not taking: Reported on 07/19/2015) 237 mL 12  . metoprolol (TOPROL-XL) 200 MG 24 hr tablet Take 1 tablet (200 mg total) by mouth daily. Take with or immediately following a meal. (Patient not taking: Reported on 07/19/2015) 30 tablet 11  . Naloxone HCl (EVZIO) 0.4 MG/0.4ML SOAJ Inject 0.4 mLs as directed as needed (opioid overdose). [name] 1 Package 1  . Urea 37.5 % CREA Apply 1 application topically daily. 142 g 0   No current facility-administered medications for this visit.   Family History  Problem Relation Age of Onset  . Hypertension Mother   . Cancer Brother     Unsure of type.  . Asthma Mother   . Coronary artery disease Mother   . Colon cancer Neg Hx   . Lung cancer Neg Hx   . Prostate cancer Neg Hx   . Stroke      mother  . Heart attack      brother and sister ?age    Social History   Social History  . Marital Status: Widowed    Spouse Name: N/A  . Number of Children: 10   . Years of Education: N/A   Occupational History  . RETIRED    Social History Main Topics  . Smoking status: Former Smoker -- 0.33 packs/day for 54 years    Types: Cigarettes    Quit date: 10/11/2014  . Smokeless tobacco: Never Used  . Alcohol Use: No     Comment: 6/22//2016 "h/o etoh abuse; stopped drinking ~ 2012"  . Drug Use: No     Comment: 11/09/2014 "none since 10/11/2014; did smoke marijuana ~ 1 time/month"  .  Sexual Activity: Not Currently   Other Topics Concern  . None   Social History Narrative   Works as a Architect part time   Smoker 1 pack last 1.5 days tobacco, smokes marijuana    Denies EtOH x 4 years (on 10/12/12)   9 kids    From [place]   [place] Veteran    12 grade education             Review of Systems: Review of Systems  Constitutional: Negative for fever, chills, weight loss and malaise/fatigue.  HENT: Negative for congestion and sore throat.   Respiratory: Positive for shortness of breath. Negative for cough, sputum production and wheezing.   Cardiovascular: Negative for chest pain, palpitations, orthopnea, leg swelling and PND.  Gastrointestinal: Positive for constipation. Negative for nausea, vomiting, abdominal pain, diarrhea, blood in stool and melena.  Genitourinary: Negative for dysuria.  Musculoskeletal: Positive for back pain and joint pain. Negative for falls.  Neurological: Positive for tingling and weakness. Negative for dizziness, seizures, loss of consciousness and headaches.  Psychiatric/Behavioral: Positive for depression. Negative for suicidal ideas and substance abuse. The patient does not have insomnia.     Objective:  Physical Exam: Filed Vitals:   07/19/15 1439  BP: 102/65  Pulse: 65  Temp: 98.1 F (36.7 C)  TempSrc: Oral  Height: '6\' 3"'  (1.905 m)  Weight: 165 lb 4.8 oz (74.98 kg)  SpO2: 100%   Physical Exam  Constitutional: He is oriented to person, place, and time. No distress.  Elderly man, sitting in  wheelchair  HENT:  Head: Normocephalic and atraumatic.  Cardiovascular: Normal rate and regular rhythm.   No murmur heard. Pulmonary/Chest: Effort normal and breath sounds normal. No respiratory distress. He has no wheezes. He has no rales.  Abdominal: Soft. There is no tenderness.  Musculoskeletal: He exhibits no edema or tenderness.  Neurological: He is alert and oriented to person, place, and time.  Skin: He is not diaphoretic.  Psychiatric: He has a normal mood and affect.    Assessment & Plan:  Please see problem based charting for assessment and plan.

## 2015-07-19 NOTE — Patient Instructions (Signed)
Patient educated about medication as defined in this encounter and verbalized understanding by repeating back instructions provided.   

## 2015-07-19 NOTE — Patient Instructions (Signed)
It was a pleasure to see you again [name].  Please decrease your Megace to 2 mg twice a day.  Please keep your follow up appointments with your Kidney and [facility] doctors.  I have filled out your [employer] benefits form and we signed a pain contract today.

## 2015-07-20 ENCOUNTER — Telehealth: Payer: Self-pay | Admitting: Licensed Clinical Social Worker

## 2015-07-20 ENCOUNTER — Telehealth: Payer: Self-pay | Admitting: Internal Medicine

## 2015-07-20 DIAGNOSIS — I4891 Unspecified atrial fibrillation: Secondary | ICD-10-CM | POA: Diagnosis not present

## 2015-07-20 DIAGNOSIS — J449 Chronic obstructive pulmonary disease, unspecified: Secondary | ICD-10-CM | POA: Diagnosis not present

## 2015-07-20 DIAGNOSIS — N19 Unspecified kidney failure: Secondary | ICD-10-CM | POA: Diagnosis not present

## 2015-07-20 DIAGNOSIS — J129 Viral pneumonia, unspecified: Secondary | ICD-10-CM | POA: Diagnosis not present

## 2015-07-20 LAB — RENAL FUNCTION PANEL
Albumin: 4 g/dL (ref 3.5–4.8)
BUN/Creatinine Ratio: 12 (ref 10–22)
BUN: 21 mg/dL (ref 8–27)
CO2: 21 mmol/L (ref 18–29)
CREATININE: 1.73 mg/dL — AB (ref 0.76–1.27)
Calcium: 8.9 mg/dL (ref 8.6–10.2)
Chloride: 108 mmol/L — ABNORMAL HIGH (ref 96–106)
GFR calc Af Amer: 45 mL/min/{1.73_m2} — ABNORMAL LOW (ref >59)
GFR, EST NON AFRICAN AMERICAN: 39 mL/min/{1.73_m2} — AB (ref >59)
Glucose: 121 mg/dL — ABNORMAL HIGH (ref 65–99)
PHOSPHORUS: 2.9 mg/dL (ref 2.5–4.5)
POTASSIUM: 4.5 mmol/L (ref 3.5–5.2)
Sodium: 147 mmol/L — ABNORMAL HIGH (ref 134–144)
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
## 2015-07-20 NOTE — Assessment & Plan Note (Signed)
Patient with chronic kidney disease, recently seen by nephrologist Dr. Moshe Cipro. Labs on 06/27/15 showed BUN 31, SCr 2.34, GFR 31, and K of 5.9. He has stated he would like to avoid dialysis if possible. Over the last year he has had progressive weight loss from decreased appetite/malnutrition. He was started on Megace which has improved his appetite, eating ~2.5 meals/day. His weight is up to 165 lbs from 148 just a few weeks ago.  A/P: Patient with significant increase in weight in 2 weeks after starting Megace. He does have history of CHF, but clinically does not appear volume overloaded and BNP on visit is 202.9, which is elevated, but much lower than prior values in chart. Renal function panel on visit show BUN 21, SCr 1.73, GFR 45, and K of 4.5 all improved from prior.  -Continue Megace at lower dose 80 mg BID in setting of CKD and CHF to avoid fluid retention -Patient will follow up with Dr. Moshe Cipro, I have routed our lab results

## 2015-07-20 NOTE — Assessment & Plan Note (Addendum)
Patient states his mood is improving and his appetite has gotten better since starting Megace. He is on Bupropion 75 mg BID and reports taking as scheduled. He denies low mood, suicidal/homicidal ideations. He states he is looking for housing (currently lives with son) as he feels this will also provide some improvement as well. He is also on Mirtazapine 15 mg qhs for sleep as well as depression. He says he is sleeping better now.  A/P: Mood improved on current therapy. Appetite also improved, eating meals. Patient no longer using Ensure. -Continue Bupropion 75 mg BID -Continue Mirtazapine 15 mg qhs
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
## 2015-07-20 NOTE — Assessment & Plan Note (Addendum)
Patient currently on Digoxin 0.0625 mg (1/2 0.125mg  tab) daily and Diltiazem 120 mg daily. Our medication list includes Metoprolol 200 mg daily, but patient says he has not taken for some time. Echo in 09/2014 with EF of 30-35% and diffuse hypokinesis. Patient started on Megace 40 mg/mL suspension (400 mg BID) for weight gain and appetite . Since then he has increased weight gain from 148 to 165. He does not complain of any increased dyspnea/SOB above his baseline which is limited to walking about 10 feet with the aide of a cane. He has not noticed any increased swelling in his feet or legs. He does not check his weight at home.  A/P: On exam, no crackles are heard on auscultation of lungs, no edema noted in feet or legs, no abdominal distension seen. He does not appear to be volume up, however this is always a concern with weight gain in a patient with his cardiac history. He does report increased appetite, eating 2.5 meals/ day now on Megace. His weight gain is likely attributed to this and he appears as if he is getting closer to his baseline weight which is around 170 lbs 1 year ago before his drop in weight to the 150s for the past few months. -BNP checked is 202.9 (prior BNPs on file have been much higher) -Decrease Megace to 80 mg BID -Hold Metoprolol for now as patient has not been taking and has borderline soft blood pressures -Patient to follow up with Cardiology Dr. Rayann Heman on 08/09/15

## 2015-07-20 NOTE — Assessment & Plan Note (Signed)
Patient reports using Spiriva daily and prn Albuterol about twice a day. He denies any wheezing, cough, or changes from his baseline.  A/P: No wheezing, crackles, or rhonchi noted on exam. Stable. -Continue Spiriva daily and prn albuterol

## 2015-07-20 NOTE — Assessment & Plan Note (Signed)
Patient takes Percocet 10-325 mg, 1 tablet every 4 hours as needed for pain, not to take more than 5 doses in a 24 hour period. Dr. Heber Potter provided patient with Narcan prescription on last visit, which patient has received.  A/P: -Renewed Pain Contract with me. Explained contract terms to patient and he understands and agrees. Copy given to patient. -Gave patient Percocet paper prescription not to be filled until 30 days after last fill.

## 2015-07-20 NOTE — Assessment & Plan Note (Signed)
BP Readings from Last 3 Encounters:  07/19/15 102/65  06/27/15 116/78  06/20/15 100/74   BP stable, patient reports not taking Metoprolol for some time now.   A/P: Will follow up with Cardiology this month to discuss if this needs to be restarted in light of borderline soft BPs and CHF. -Continue current therapy
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
## 2015-07-24 ENCOUNTER — Other Ambulatory Visit: Payer: Self-pay | Admitting: Internal Medicine

## 2015-07-26 ENCOUNTER — Other Ambulatory Visit: Payer: Self-pay

## 2015-07-26 NOTE — Patient Outreach (Signed)
[facility] [facility]) Care Management  07/26/2015  [name] Apr 03, 1946 [id number]   Telephone call to patient regarding [employer] HMO tier 4 high risk referral.  Unable to reach patient. HIPAA compliant voice message left with call back phone number.   PLAN:  RNCM will attempt to contact patient within 1 week.   [name] RN,BSN,CCM [facility] Telephonic  [phone]
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
## 2015-07-27 ENCOUNTER — Other Ambulatory Visit: Payer: Self-pay

## 2015-07-27 NOTE — Patient Outreach (Signed)
[facility]) Care Management  07/27/2015  [name] [birth year] [id number]   Second telephone call to patient regarding [employer] high risk referral. Unable to reach patient. HIPAA compliant voice message left on listed home number and mobile number.    PLAN: RNCM will attempt 3rd telephone outreach to patient within 1 week.   [name] RN,BSN,CCM [facility] Telephonic  [phone]

## 2015-07-28 ENCOUNTER — Encounter (HOSPITAL_COMMUNITY): Payer: Self-pay

## 2015-07-28 ENCOUNTER — Other Ambulatory Visit: Payer: Self-pay

## 2015-07-28 ENCOUNTER — Inpatient Hospital Stay (HOSPITAL_COMMUNITY)
Admission: EM | Admit: 2015-07-28 | Discharge: 2015-08-08 | DRG: 190 | Disposition: A | Discharge status: Skilled Nursing Facility | Payer: Commercial Managed Care - HMO | Attending: Oncology | Admitting: Oncology

## 2015-07-28 ENCOUNTER — Emergency Department (HOSPITAL_COMMUNITY): Payer: Commercial Managed Care - HMO

## 2015-07-28 DIAGNOSIS — I2510 Atherosclerotic heart disease of native coronary artery without angina pectoris: Secondary | ICD-10-CM | POA: Diagnosis present

## 2015-07-28 DIAGNOSIS — E43 Unspecified severe protein-calorie malnutrition: Secondary | ICD-10-CM | POA: Diagnosis not present

## 2015-07-28 DIAGNOSIS — Z0389 Encounter for observation for other suspected diseases and conditions ruled out: Secondary | ICD-10-CM

## 2015-07-28 DIAGNOSIS — Z598 Other problems related to housing and economic circumstances: Secondary | ICD-10-CM

## 2015-07-28 DIAGNOSIS — Z8249 Family history of ischemic heart disease and other diseases of the circulatory system: Secondary | ICD-10-CM | POA: Diagnosis not present

## 2015-07-28 DIAGNOSIS — Z96643 Presence of artificial hip joint, bilateral: Secondary | ICD-10-CM | POA: Diagnosis present

## 2015-07-28 DIAGNOSIS — F319 Bipolar disorder, unspecified: Secondary | ICD-10-CM | POA: Diagnosis present

## 2015-07-28 DIAGNOSIS — Z809 Family history of malignant neoplasm, unspecified: Secondary | ICD-10-CM | POA: Diagnosis not present

## 2015-07-28 DIAGNOSIS — N183 Chronic kidney disease, stage 3 unspecified: Secondary | ICD-10-CM

## 2015-07-28 DIAGNOSIS — Z681 Body mass index (BMI) 19 or less, adult: Secondary | ICD-10-CM

## 2015-07-28 DIAGNOSIS — Z22322 Carrier or suspected carrier of Methicillin resistant Staphylococcus aureus: Secondary | ICD-10-CM | POA: Diagnosis not present

## 2015-07-28 DIAGNOSIS — R140 Abdominal distension (gaseous): Secondary | ICD-10-CM | POA: Diagnosis not present

## 2015-07-28 DIAGNOSIS — I110 Hypertensive heart disease with heart failure: Secondary | ICD-10-CM | POA: Diagnosis not present

## 2015-07-28 DIAGNOSIS — K567 Ileus, unspecified: Secondary | ICD-10-CM | POA: Diagnosis not present

## 2015-07-28 DIAGNOSIS — Z825 Family history of asthma and other chronic lower respiratory diseases: Secondary | ICD-10-CM

## 2015-07-28 DIAGNOSIS — K56609 Unspecified intestinal obstruction, unspecified as to partial versus complete obstruction: Secondary | ICD-10-CM | POA: Diagnosis not present

## 2015-07-28 DIAGNOSIS — Z5989 Other problems related to housing and economic circumstances: Secondary | ICD-10-CM

## 2015-07-28 DIAGNOSIS — I429 Cardiomyopathy, unspecified: Secondary | ICD-10-CM | POA: Diagnosis present

## 2015-07-28 DIAGNOSIS — K219 Gastro-esophageal reflux disease without esophagitis: Secondary | ICD-10-CM | POA: Diagnosis present

## 2015-07-28 DIAGNOSIS — Z823 Family history of stroke: Secondary | ICD-10-CM | POA: Diagnosis not present

## 2015-07-28 DIAGNOSIS — I130 Hypertensive heart and chronic kidney disease with heart failure and stage 1 through stage 4 chronic kidney disease, or unspecified chronic kidney disease: Secondary | ICD-10-CM | POA: Diagnosis not present

## 2015-07-28 DIAGNOSIS — R05 Cough: Secondary | ICD-10-CM | POA: Diagnosis not present

## 2015-07-28 DIAGNOSIS — N179 Acute kidney failure, unspecified: Secondary | ICD-10-CM | POA: Diagnosis present

## 2015-07-28 DIAGNOSIS — J438 Other emphysema: Secondary | ICD-10-CM

## 2015-07-28 DIAGNOSIS — E785 Hyperlipidemia, unspecified: Secondary | ICD-10-CM | POA: Diagnosis present

## 2015-07-28 DIAGNOSIS — I5021 Acute systolic (congestive) heart failure: Secondary | ICD-10-CM

## 2015-07-28 DIAGNOSIS — Z7901 Long term (current) use of anticoagulants: Secondary | ICD-10-CM

## 2015-07-28 DIAGNOSIS — Z77090 Contact with and (suspected) exposure to asbestos: Secondary | ICD-10-CM | POA: Diagnosis present

## 2015-07-28 DIAGNOSIS — Z86718 Personal history of other venous thrombosis and embolism: Secondary | ICD-10-CM | POA: Diagnosis not present

## 2015-07-28 DIAGNOSIS — I248 Other forms of acute ischemic heart disease: Secondary | ICD-10-CM | POA: Diagnosis not present

## 2015-07-28 DIAGNOSIS — G8929 Other chronic pain: Secondary | ICD-10-CM | POA: Diagnosis present

## 2015-07-28 DIAGNOSIS — I5023 Acute on chronic systolic (congestive) heart failure: Secondary | ICD-10-CM | POA: Diagnosis present

## 2015-07-28 DIAGNOSIS — K560 Paralytic ileus: Secondary | ICD-10-CM | POA: Diagnosis not present

## 2015-07-28 DIAGNOSIS — Z950 Presence of cardiac pacemaker: Secondary | ICD-10-CM | POA: Diagnosis not present

## 2015-07-28 DIAGNOSIS — Z87891 Personal history of nicotine dependence: Secondary | ICD-10-CM | POA: Diagnosis not present

## 2015-07-28 DIAGNOSIS — I252 Old myocardial infarction: Secondary | ICD-10-CM | POA: Diagnosis not present

## 2015-07-28 DIAGNOSIS — J449 Chronic obstructive pulmonary disease, unspecified: Secondary | ICD-10-CM | POA: Diagnosis not present

## 2015-07-28 DIAGNOSIS — Z7982 Long term (current) use of aspirin: Secondary | ICD-10-CM

## 2015-07-28 DIAGNOSIS — E875 Hyperkalemia: Secondary | ICD-10-CM | POA: Diagnosis present

## 2015-07-28 DIAGNOSIS — I213 ST elevation (STEMI) myocardial infarction of unspecified site: Secondary | ICD-10-CM

## 2015-07-28 DIAGNOSIS — I5042 Chronic combined systolic (congestive) and diastolic (congestive) heart failure: Secondary | ICD-10-CM | POA: Diagnosis not present

## 2015-07-28 DIAGNOSIS — I482 Chronic atrial fibrillation: Secondary | ICD-10-CM | POA: Diagnosis present

## 2015-07-28 DIAGNOSIS — R778 Other specified abnormalities of plasma proteins: Secondary | ICD-10-CM | POA: Diagnosis not present

## 2015-07-28 DIAGNOSIS — I481 Persistent atrial fibrillation: Secondary | ICD-10-CM | POA: Diagnosis present

## 2015-07-28 DIAGNOSIS — J189 Pneumonia, unspecified organism: Secondary | ICD-10-CM | POA: Diagnosis not present

## 2015-07-28 DIAGNOSIS — J441 Chronic obstructive pulmonary disease with (acute) exacerbation: Secondary | ICD-10-CM | POA: Diagnosis present

## 2015-07-28 DIAGNOSIS — I420 Dilated cardiomyopathy: Secondary | ICD-10-CM | POA: Diagnosis not present

## 2015-07-28 DIAGNOSIS — I10 Essential (primary) hypertension: Secondary | ICD-10-CM | POA: Diagnosis present

## 2015-07-28 DIAGNOSIS — Z5181 Encounter for therapeutic drug level monitoring: Secondary | ICD-10-CM | POA: Diagnosis not present

## 2015-07-28 DIAGNOSIS — I5022 Chronic systolic (congestive) heart failure: Secondary | ICD-10-CM

## 2015-07-28 DIAGNOSIS — F339 Major depressive disorder, recurrent, unspecified: Secondary | ICD-10-CM | POA: Diagnosis present

## 2015-07-28 DIAGNOSIS — R109 Unspecified abdominal pain: Secondary | ICD-10-CM | POA: Diagnosis not present

## 2015-07-28 DIAGNOSIS — I4891 Unspecified atrial fibrillation: Secondary | ICD-10-CM

## 2015-07-28 DIAGNOSIS — J440 Chronic obstructive pulmonary disease with acute lower respiratory infection: Secondary | ICD-10-CM | POA: Diagnosis present

## 2015-07-28 DIAGNOSIS — R000 Tachycardia, unspecified: Secondary | ICD-10-CM | POA: Diagnosis not present

## 2015-07-28 DIAGNOSIS — R0602 Shortness of breath: Secondary | ICD-10-CM | POA: Diagnosis not present

## 2015-07-28 DIAGNOSIS — I743 Embolism and thrombosis of arteries of the lower extremities: Secondary | ICD-10-CM | POA: Diagnosis present

## 2015-07-28 DIAGNOSIS — R262 Difficulty in walking, not elsewhere classified: Secondary | ICD-10-CM | POA: Diagnosis not present

## 2015-07-28 DIAGNOSIS — IMO0001 Reserved for inherently not codable concepts without codable children: Secondary | ICD-10-CM

## 2015-07-28 DIAGNOSIS — I4811 Longstanding persistent atrial fibrillation: Secondary | ICD-10-CM | POA: Diagnosis present

## 2015-07-28 DIAGNOSIS — Z4682 Encounter for fitting and adjustment of non-vascular catheter: Secondary | ICD-10-CM | POA: Diagnosis not present

## 2015-07-28 DIAGNOSIS — R0600 Dyspnea, unspecified: Secondary | ICD-10-CM

## 2015-07-28 DIAGNOSIS — M6281 Muscle weakness (generalized): Secondary | ICD-10-CM | POA: Diagnosis not present

## 2015-07-28 DIAGNOSIS — N189 Chronic kidney disease, unspecified: Secondary | ICD-10-CM

## 2015-07-28 DIAGNOSIS — R278 Other lack of coordination: Secondary | ICD-10-CM | POA: Diagnosis not present

## 2015-07-28 DIAGNOSIS — Z79891 Long term (current) use of opiate analgesic: Secondary | ICD-10-CM

## 2015-07-28 DIAGNOSIS — R069 Unspecified abnormalities of breathing: Secondary | ICD-10-CM | POA: Diagnosis not present

## 2015-07-28 LAB — I-STAT TROPONIN, ED: Troponin i, poc: 0.12 ng/mL (ref 0.00–0.08)

## 2015-07-28 LAB — BASIC METABOLIC PANEL
Anion gap: 11 (ref 5–15)
BUN: 24 mg/dL — AB (ref 6–20)
CALCIUM: 8.6 mg/dL — AB (ref 8.9–10.3)
CO2: 20 mmol/L — AB (ref 22–32)
CREATININE: 1.7 mg/dL — AB (ref 0.61–1.24)
Chloride: 116 mmol/L — ABNORMAL HIGH (ref 101–111)
GFR calc non Af Amer: 39 mL/min — ABNORMAL LOW (ref >60)
GFR, EST AFRICAN AMERICAN: 45 mL/min — AB (ref >60)
Glucose, Bld: 93 mg/dL (ref 65–99)
Potassium: 4.3 mmol/L (ref 3.5–5.1)
SODIUM: 147 mmol/L — AB (ref 135–145)

## 2015-07-28 LAB — CBC
HCT: 39.6 % (ref 39.0–52.0)
Hemoglobin: 12.7 g/dL — ABNORMAL LOW (ref 13.0–17.0)
MCH: 30 pg (ref 26.0–34.0)
MCHC: 32.1 g/dL (ref 30.0–36.0)
MCV: 93.6 fL (ref 78.0–100.0)
PLATELETS: 158 10*3/uL (ref 150–400)
RBC: 4.23 MIL/uL (ref 4.22–5.81)
RDW: 15.8 % — ABNORMAL HIGH (ref 11.5–15.5)
WBC: 10.8 10*3/uL — AB (ref 4.0–10.5)

## 2015-07-28 LAB — BRAIN NATRIURETIC PEPTIDE: B NATRIURETIC PEPTIDE 5: 476.4 pg/mL — AB (ref 0.0–100.0)

## 2015-07-28 LAB — DIGOXIN LEVEL: DIGOXIN LVL: 0.6 ng/mL — AB (ref 0.8–2.0)

## 2015-07-28 LAB — I-STAT CG4 LACTIC ACID, ED: Lactic Acid, Venous: 1.11 mmol/L (ref 0.5–2.0)

## 2015-07-28 MED ORDER — GABAPENTIN 300 MG PO CAPS
300.0000 mg | ORAL_CAPSULE | Freq: Three times a day (TID) | ORAL | Status: DC
  Administered 2015-07-28 – 2015-08-03 (×18): 300 mg via ORAL
  Filled 2015-07-28 (×18): qty 1

## 2015-07-28 MED ORDER — DILTIAZEM LOAD VIA INFUSION
20.0000 mg | Freq: Once | INTRAVENOUS | Status: AC
  Administered 2015-07-28: 20 mg via INTRAVENOUS
  Filled 2015-07-28: qty 20

## 2015-07-28 MED ORDER — MIRTAZAPINE 15 MG PO TABS
15.0000 mg | ORAL_TABLET | Freq: Every day | ORAL | Status: DC
  Administered 2015-07-28 – 2015-08-03 (×7): 15 mg via ORAL
  Filled 2015-07-28 (×7): qty 1

## 2015-07-28 MED ORDER — DEXTROSE 5 % IV SOLN: 500.0000 mg | INTRAVENOUS | Status: DC

## 2015-07-28 MED ORDER — IPRATROPIUM-ALBUTEROL 0.5-2.5 (3) MG/3ML IN SOLN
3.0000 mL | Freq: Four times a day (QID) | RESPIRATORY_TRACT | Status: DC | PRN
  Administered 2015-07-28 – 2015-07-29 (×3): 3 mL via RESPIRATORY_TRACT
  Filled 2015-07-28 (×3): qty 3

## 2015-07-28 MED ORDER — BUPROPION HCL 75 MG PO TABS
75.0000 mg | ORAL_TABLET | Freq: Two times a day (BID) | ORAL | Status: DC
  Administered 2015-07-28 – 2015-08-03 (×13): 75 mg via ORAL
  Filled 2015-07-28 (×20): qty 1

## 2015-07-28 MED ORDER — DEXTROSE 5 % IV SOLN
1.0000 g | Freq: Once | INTRAVENOUS | Status: AC
  Administered 2015-07-28: 1 g via INTRAVENOUS
  Filled 2015-07-28: qty 10

## 2015-07-28 MED ORDER — LEVALBUTEROL HCL 1.25 MG/0.5ML IN NEBU
1.2500 mg | INHALATION_SOLUTION | Freq: Once | RESPIRATORY_TRACT | Status: AC
  Administered 2015-07-28: 1.25 mg via RESPIRATORY_TRACT
  Filled 2015-07-28 (×2): qty 0.5

## 2015-07-28 MED ORDER — OXYCODONE-ACETAMINOPHEN 5-325 MG PO TABS
2.0000 | ORAL_TABLET | Freq: Once | ORAL | Status: AC
  Administered 2015-07-28: 2 via ORAL
  Filled 2015-07-28: qty 2

## 2015-07-28 MED ORDER — DEXTROSE 5 % IV SOLN
500.0000 mg | Freq: Once | INTRAVENOUS | Status: AC
  Administered 2015-07-28: 500 mg via INTRAVENOUS
  Filled 2015-07-28: qty 500

## 2015-07-28 MED ORDER — DILTIAZEM HCL 100 MG IV SOLR
5.0000 mg/h | INTRAVENOUS | Status: DC
  Administered 2015-07-28: 5 mg/h via INTRAVENOUS
  Administered 2015-07-29: 10 mg/h via INTRAVENOUS
  Administered 2015-07-29: 15 mg/h via INTRAVENOUS
  Filled 2015-07-28 (×5): qty 100

## 2015-07-28 MED ORDER — FUROSEMIDE 10 MG/ML IJ SOLN
20.0000 mg | Freq: Once | INTRAMUSCULAR | Status: AC
  Administered 2015-07-28: 20 mg via INTRAVENOUS
  Filled 2015-07-28: qty 2

## 2015-07-28 MED ORDER — APIXABAN 5 MG PO TABS
5.0000 mg | ORAL_TABLET | Freq: Two times a day (BID) | ORAL | Status: DC
  Administered 2015-07-28 – 2015-08-03 (×12): 5 mg via ORAL
  Filled 2015-07-28 (×12): qty 1

## 2015-07-28 MED ORDER — DEXTROSE 5 % IV SOLN: 1.0000 g | INTRAVENOUS | Status: DC

## 2015-07-28 NOTE — ED Notes (Signed)
Admitting MD at bedside.

## 2015-07-28 NOTE — ED Provider Notes (Addendum)
CSN: [id number]     Arrival date & time 07/28/15  1816 History   First MD Initiated Contact with Patient 07/28/15 1828     Chief Complaint  Patient presents with  . Shortness of Breath     (Consider location/radiation/quality/duration/timing/severity/associated sxs/prior Treatment) Patient is a 70 y.o. male presenting with shortness of breath. The history is provided by the patient.  Shortness of Breath Severity:  Severe Onset quality:  Gradual Duration:  2 days Timing:  Constant Progression:  Worsening Chronicity:  Recurrent Context comment:  Nonproductive cough, worsening swelling in the lower extremities and palpitations Relieved by: Inhaler give some improvement. Exacerbated by: When he stands he becomes lightheaded and dizzy and more short of breath when he attempts to ambulate. Ineffective treatments:  Rest Associated symptoms: chest pain, cough, diaphoresis and wheezing   Associated symptoms: no abdominal pain, no fever, no sputum production and no vomiting   Associated symptoms comment:  Lower extremity edema and weight gain Risk factors: no recent alcohol use and no tobacco use     Past Medical History  Diagnosis Date  . Hypertension   . Chronic systolic heart failure ([place])     a. NICM - EF 35% with normal cors 2003. b. Last echo 11/2012 - EF 25-30%.  . Depression   . GERD (gastroesophageal reflux disease)   . Portal vein thrombosis   . Avascular necrosis of bones of both hips (HCC)     Status post bilateral hip replacements  . Gastritis   . Alcohol abuse   . Erectile dysfunction   . Bipolar disorder ([place])   . Hydrocele, unspecified   . Lung nodule     Chest CT scan on 12/14/2008 showed a nodular opacity at the left lung base felt to most likely represent scarring.  Follow-up chest CT scan on 06/02/2010 showed parenchymal scarring in the left apex, left  lower lobe, and lingula; some of this scarring at the left base had a nodular appearance, unchanged. Chest  09/2012 - stable.  . 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  Thyroid scan 08/28/2009 was normal with no focal areas of abnormal increased or decreased activity seen; the uptake of I 131 sodium iodide at 24 hours was 5.7%.  TSH and free T4 normalized by 08/16/2009.  . Intestinal obstruction ([place])   . COPD (chronic obstructive pulmonary disease) ([place])   . E coli bacteremia   . DVT (deep venous thrombosis) ([place])     He has hypercoagulability with multiple prior DVTs  . Pleural plaque     H/o asbestos exposure. Chest CT on 06/02/2010 showed stable extensive calcified pleural plaques involving the left hemithorax, consistent with asbestos related pleural disease.  . Weight loss     Normal colonoscopy by Dr. Olevia Perches on 11/06/2010.  . 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 b. St. Jude gen change 2010.  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  . Noncompliance   . Thrombocytopenia ([place])   . Osteoarthritis   . Spondylosis   . Anxiety   . CKD (chronic kidney disease)     Renal U/S 12/04/2009 showed no pathological findings. Labs 12/04/2009 include normal ESR, C3, C4; neg ANA; SPEP showed nonspecific increase in the alpha-2 region with no M-spike; UPEP showed no monoclonal free light chains; urine IFE showed polyclonal increase in feree Kappa  and/or free Lambda light chains. Baseline Cr reported 1.7-2.5.  . STEMI (ST elevation myocardial infarction) ([place]) 10/11/2014  . Hyperlipemia    Past Surgical History  Procedure Laterality Date  . Total hip arthroplasty Right 03/08/2008     By Dr. Hiram Comber  . Tee without cardioversion N/A 05/27/2013    Procedure: TRANSESOPHAGEAL ECHOCARDIOGRAM (TEE);  Surgeon: Candee Furbish, MD;  Location[facility] ENDOSCOPY;  Service: Cardiovascular;  Laterality: N/A;  . Cardioversion N/A 05/27/2013     Procedure: CARDIOVERSION;  Surgeon: Candee Furbish, MD;  Location: [facility] ENDOSCOPY;  Service: Cardiovascular;  Laterality: N/A;  . Cardiac catheterization  2003    Nl cors, EF 35%  . Embolectomy Right 12/10/2013    Procedure: Right Femoral Embolectomy; Fasciotomy Right Lower Leg with Intraoperative arteriogram;  Surgeon: Rosetta Posner, MD;  Location: [place];  Service: Vascular;  Laterality: Right;  . Intraoperative arteriogram Right 12/10/2013    Procedure: INTRA OPERATIVE ARTERIOGRAM;  Surgeon: Rosetta Posner, MD;  Location: [place];  Service: Vascular;  Laterality: Right;  . Fasciotomy closure Right 12/15/2013    Procedure: LEG FASCIOTOMY CLOSURE;  Surgeon: Rosetta Posner, MD;  Location: [facility] OR;  Service: Vascular;  Laterality: Right;  . Embolectomy Left 10/12/2014    Procedure: Left Femoral Thrombectomy with intraoperative arteriogram;  Surgeon: Rosetta Posner, MD;  Location: [facility] OR;  Service: Vascular;  Laterality: Left;  . Hemiarthroplasty hip Left 09/09/2002    bipolar; Dr. Lafayette Dragon  . Joint replacement    . Insert / replace / remove pacemaker  02/2003    Dr. Roe Rutherford, [place] pulse generator identity SR model [id number] serial [id number]; gen change by Greggory Brandy  . Insert / replace / remove pacemaker  06/17/2008    Lead and generator change w/ St. Jude Medical Tendril ST model [id number] (serial  [id number]).        Family History  Problem Relation Age of Onset  . Hypertension Mother   . Cancer Brother     Unsure of type.  . Asthma Mother   . Coronary artery disease Mother   . Colon cancer Neg Hx   . Lung cancer Neg Hx   . Prostate cancer Neg Hx   . Stroke      mother  . Heart attack      brother and sister ?age    Social History  Substance Use Topics  . Smoking status: Former Smoker -- 0.33 packs/day for 54 years    Types: Cigarettes    Quit date: 10/11/2014  . Smokeless tobacco: Never Used  . Alcohol Use: No     Comment: 6/22//2016 "h/o etoh abuse; stopped drinking ~ 2012"     Review of Systems  Constitutional: Positive for diaphoresis. Negative for fever.  Respiratory: Positive for cough, shortness of breath and wheezing. Negative for sputum production.   Cardiovascular: Positive for chest pain.  Gastrointestinal: Negative for vomiting and abdominal pain.  All other systems reviewed and are negative.     Allergies  Penicillins  Home Medications   Prior to Admission medications   Medication Sig Start Date End Date Taking? Authorizing Provider  albuterol (VENTOLIN HFA) 108 (90 BASE) MCG/ACT inhaler Inhale 2 puffs into the lungs every 6 (six) hours as needed for wheezing or shortness of breath. 01/06/15   Nischal Dareen Piano, MD  apixaban (ELIQUIS) 5 MG TABS tablet Take 1 tablet (5 mg total) by mouth 2 (two) times daily. 01/06/15   Aldine Contes, MD  aspirin 81 MG chewable  tablet Chew 1 tablet (81 mg total) by mouth daily. 11/17/14   Rushil Sherrye Payor, MD  bisacodyl (DULCOLAX) 10 MG suppository Place 1 suppository (10 mg total) rectally daily as needed for severe constipation. Patient not taking: Reported on 07/19/2015 01/06/15   Aldine Contes, MD  buPROPion (WELLBUTRIN) 75 MG tablet Take 1 tablet (75 mg total) by mouth 2 (two) times daily. 01/06/15   Nischal Narendra, MD  digoxin (LANOXIN) 0.125 MG tablet Take 0.5 tablets (0.0625 mg total) by mouth daily. 01/06/15   Aldine Contes, MD  diltiazem (CARDIZEM CD) 120 MG 24 hr capsule Take 1 capsule (120 mg total) by mouth daily. 01/06/15   Nischal Narendra, MD  diphenhydrAMINE (BENADRYL) 2 % cream Apply 1 application topically 3 (three) times daily as needed for itching. Reported on 06/16/2015    Historical Provider, MD  feeding supplement, ENSURE ENLIVE, (ENSURE ENLIVE) LIQD Take 237 mLs by mouth 2 (two) times daily between meals. Patient not taking: Reported on 07/19/2015 11/17/14   Riccardo Dubin, MD  gabapentin (NEURONTIN) 300 MG capsule TAKE ONE CAPSULE BY MOUTH 3 TIMES A DAY 06/19/15   Zada Finders, MD  megestrol  (MEGACE) 40 MG/ML suspension Take 80 mg by mouth 2 (two) times daily.     Historical Provider, MD  metoprolol (TOPROL-XL) 200 MG 24 hr tablet Take 1 tablet (200 mg total) by mouth daily. Take with or immediately following a meal. Patient not taking: Reported on 07/19/2015 10/27/14   Juluis Mire, MD  mirtazapine (REMERON) 15 MG tablet TAKE 1 TABLET BY MOUTH AT BEDTIME 07/25/15   Zada Finders, MD  Naloxone HCl (EVZIO) 0.4 MG/0.4ML SOAJ Inject 0.4 mLs as directed as needed (opioid overdose). [name] 06/28/15   Lucious Groves, DO  omeprazole (PRILOSEC) 40 MG capsule Take 1 capsule (40 mg total) by mouth daily. 01/06/15   Aldine Contes, MD  oxyCODONE-acetaminophen (PERCOCET) 10-325 MG tablet Take 1 tablet by mouth every 4 (four) hours as needed for pain. Do not take more than 5 doses in a 24 hour period. 07/19/15   Zada Finders, MD  rosuvastatin (CRESTOR) 20 MG tablet Take 1 tablet (20 mg total) by mouth daily at 6 PM. 01/06/15   Aldine Contes, MD  senna-docusate ([employer] SENNA PLUS) 8.6-50 MG per tablet Take 2 tablets by mouth 2 (two) times daily. 01/06/15   Nischal Narendra, MD  tiotropium (SPIRIVA) 18 MCG inhalation capsule Place 1 capsule (18 mcg total) into inhaler and inhale daily. 01/06/15   Nischal Narendra, MD  Urea 37.5 % CREA Apply 1 application topically daily. 03/31/15   Trula Slade, DPM   BP 140/93 mmHg  Pulse 63  Temp(Src) 98.7 F (37.1 C) (Oral)  Resp 16  Ht '6\' 3"'  (1.905 m)  Wt 165 lb (74.844 kg)  BMI 20.62 kg/m2  SpO2 96% Physical Exam  Constitutional: He is oriented to person, place, and time. He appears well-developed and well-nourished. No distress.  HENT:  Head: Normocephalic and atraumatic.  Mouth/Throat: Oropharynx is clear and moist.  Eyes: Conjunctivae and EOM are normal. Pupils are equal, round, and reactive to light.  Neck: Normal range of motion. Neck supple.  Cardiovascular: Intact distal pulses.  An irregularly irregular rhythm present. Tachycardia present.    No murmur heard. Pulmonary/Chest: Effort normal. Tachypnea noted. No respiratory distress. He has wheezes. He has rhonchi. He has no rales.  Abdominal: Soft. He exhibits no distension. There is no tenderness. There is no rebound and no guarding.  Musculoskeletal: Normal range  of motion. He exhibits edema. He exhibits no tenderness.  2+ pitting edema to the knee bilaterally  Neurological: He is alert and oriented to person, place, and time.  Skin: Skin is warm and dry. No rash noted. No erythema. There is pallor.  Psychiatric: He has a normal mood and affect. His behavior is normal.  Nursing note and vitals reviewed.   ED Course  Procedures (including critical care time) Labs Review Labs Reviewed  BASIC METABOLIC PANEL - Abnormal; Notable for the following:    Sodium 147 (*)    Chloride 116 (*)    CO2 20 (*)    BUN 24 (*)    Creatinine, Ser 1.70 (*)    Calcium 8.6 (*)    GFR calc non Af Amer 39 (*)    GFR calc Af Amer 45 (*)    All other components within normal limits  CBC - Abnormal; Notable for the following:    WBC 10.8 (*)    Hemoglobin 12.7 (*)    RDW 15.8 (*)    All other components within normal limits  BRAIN NATRIURETIC PEPTIDE - Abnormal; Notable for the following:    B Natriuretic Peptide 476.4 (*)    All other components within normal limits  DIGOXIN LEVEL - Abnormal; Notable for the following:    Digoxin Level 0.6 (*)    All other components within normal limits  I-STAT TROPOININ, ED - Abnormal; Notable for the following:    Troponin i, poc 0.12 (*)    All other components within normal limits  I-STAT CG4 LACTIC ACID, ED    Imaging Review Dg Chest 2 View  07/28/2015  CLINICAL DATA:  Shortness of breath, productive cough, tachycardia, chest pain, weakness, dizziness EXAM: CHEST  2 VIEW COMPARISON:  06/16/2015 FINDINGS: Mild patchy right lower lobe opacity, suspicious for pneumonia, less likely atelectasis. Associated small right pleural effusion. Stable  scarring in the left mid lung and left lung base. Volume loss in the left hemithorax. Small left pleural effusion with apical capping. No pneumothorax. The heart is top-normal in size.  Right subclavian pacemaker. IMPRESSION: Mild patchy right lower lobe opacity, suspicious for pneumonia, less likely atelectasis. Associated small bilateral pleural effusions. Chronic left lung scarring with volume loss. Electronically Signed   By: Julian Hy M.D.   On: 07/28/2015 20:00   I have personally reviewed and evaluated these images and lab results as part of my medical decision-making.   EKG Interpretation   Date/Time:  Friday July 28 2015 18:31:43 EST Ventricular Rate:  144 PR Interval:    QRS Duration: 79 QT Interval:  324 QTC Calculation: 501 R Axis:   64 Text Interpretation:  Atrial fibrillation Borderline T wave abnormalities  Prolonged QT interval No significant change since last tracing Confirmed  by Maryan Rued  MD, Loree Fee ([id number]) on 07/28/2015 6:42:59 PM      MDM   Final diagnoses:  Atrial fibrillation with RVR (HCC)  CAP (community acquired pneumonia)  Acute systolic congestive heart failure ([place])    Patient is a 70 year old male with a history of hypertension, systolic heart failure, alcohol abuse, COPD, chronic kidney disease, MI who presents with 2 days of worsening symptoms including shortness of breath, nonproductive cough and chills. He is also complaining of a headache and dizziness when he stands.  Patient currently is in atrial fibrillation which is known with RVR rates between 140-160. He states over the last 2 days he has been using his albuterol nebulizer which is the only thing  that has improved his shortness of breath. On exam he appears to be fluid overloaded with lower extremity edema as well as rhonchi and wheezing in all lung fields. Concern for possible pneumonia versus COPD exacerbation versus CHF with A. fib RVR.  Patient is complaining of a headache that's been  ongoing for the last 2 days however denies any focal deficits or vision changes concerning for stroke or head bleed. He denies any trauma or falls.  Patient was given Xopenex and given a Cardizem bolus and drip. Patient also given Percocet for his headaches but she states he takes at home and it helps.    Chest x-ray, CBC, BMP, BNP, lactate, digoxin level, troponin pending.  8:37 PM X-rays consistent with a right lower lobe pneumonia with evidence of CHF with an elevated BNP in the 400s and a mild troponin leak which is most likely from strain from his A. fib RVR. Hall Busing is within normal limits and CO2 was within normal limits with low suspicion that this is a COPD exacerbation. Patient's renal function is unchanged. He was started on Rocephin, azithromycin and given 20 mg of Lasix.  CRITICAL CARE Performed by: Blanchie Dessert Total critical care time: 30 minutes Critical care time was exclusive of separately billable procedures and treating other patients. Critical care was necessary to treat or prevent imminent or life-threatening deterioration. Critical care was time spent personally by me on the following activities: development of treatment plan with patient and/or surrogate as well as nursing, discussions with consultants, evaluation of patient's response to treatment, examination of patient, obtaining history from patient or surrogate, ordering and performing treatments and interventions, ordering and review of laboratory studies, ordering and review of radiographic studies, pulse oximetry and re-evaluation of patient's condition.   Blanchie Dessert, MD 07/28/15 2037  Blanchie Dessert, MD 07/29/15 [phone]

## 2015-07-28 NOTE — H&P (Signed)
Date: 07/28/2015               Patient Name:  [name] MRN: [id number]  DOB: 12-18-1945 Age / Sex: 70 y.o., male   PCP: Travis Finders, MD         Medical Service: Internal Medicine Teaching Service         Attending Physician: Dr. Truman Hayward, MD    First Contact: Dr. Zada [name] Pager: [phone]  Second Contact: Dr. Jacques Earthly Pager: [phone]       After Hours (After 5p/  First Contact Pager: [phone]  weekends / holidays): Second Contact Pager: [phone]   Chief Complaint: Shortness of breath   History of Present Illness: [name] is a 70 year old male with a past medical history of systolic CHF EF 76-80%, ETOH abuse, COPD, CKD III, CAD presenting with complaints of shortness of breath, cough and chills. He reports for the past two days he has had worsening shortness of breath with associated nonproductive cough. He reports that he has also had chest palpations with substernal chest pressure since his shortness of breath began. The pain does not radiate anywhere and is constant. Reports similar pain in the past when he has been in atrial fibrillation. Denies any fevers but reports chills. Reports some improvement in his shortness of breath with albuterol treatment at home but symptoms have gotten progressively worse. Repeats headache and lightheadedness and dizziness upon standing. Has had good PO intake over the past 2 days. Does note worsening bilateral lower extremity edema the past 2 days but no leg pain. No vision changes, nausea/vomiting/diarrhea, abdominal pain or myalgias. Says his girlfriend was diagnosed with the flu 2 days ago before his symptoms began.   He was found to be in atrial fibrillation with RVR rates between 140-160 on arrival to the ED. He is on digoxin, diltiazem and eliquis for his atrial fibrillation. Reports compliance with his medications. Of note, [name]. This was decreased to 80 mg  bid in clinic on 3/1. Patient does have a history of multiple venous clots in the past.   Meds: Current Facility-Administered Medications  Medication Dose Route Frequency Provider Last Rate Last Dose  . apixaban (ELIQUIS) tablet 5 mg  5 mg Oral BID Francesca Oman, DO      . azithromycin [facility]) 500 mg in dextrose 5 % 250 mL IVPB  500 mg Intravenous Once Blanchie Dessert, MD 250 mL/hr at 07/28/15 2157 500 mg at 07/28/15 2157  . [START ON 07/29/2015] azithromycin (ZITHROMAX) 500 mg in dextrose 5 % 250 mL IVPB  500 mg Intravenous Q24H Francesca Oman, DO      . buPROPion [facility]) tablet 75 mg  75 mg Oral BID Francesca Oman, DO      . [START ON 07/29/2015] cefTRIAXone (ROCEPHIN) 1 g in dextrose 5 % 50 mL IVPB  1 g Intravenous Q24H Francesca Oman, DO      . diltiazem (CARDIZEM) 100 mg in dextrose 5 % 100 mL (1 mg/mL) infusion  5-15 mg/hr Intravenous Continuous Blanchie Dessert, MD 10 mL/hr at 07/28/15 2156 10 mg/hr at 07/28/15 2156  . gabapentin (NEURONTIN) capsule 300 mg  300 mg Oral TID Francesca Oman, DO      . mirtazapine (REMERON) tablet 15 mg  15 mg Oral QHS Francesca Oman, DO       Current Outpatient Prescriptions  Medication Sig Dispense Refill  .  albuterol (VENTOLIN HFA) 108 (90 BASE) MCG/ACT inhaler Inhale 2 puffs into the lungs every 6 (six) hours as needed for wheezing or shortness of breath. 3 Inhaler 3  . apixaban (ELIQUIS) 5 MG TABS tablet Take 1 tablet (5 mg total) by mouth 2 (two) times daily. 180 tablet 3  . aspirin 81 MG chewable tablet Chew 1 tablet (81 mg total) by mouth daily. 30 tablet 12  . buPROPion (WELLBUTRIN) 75 MG tablet Take 1 tablet (75 mg total) by mouth 2 (two) times daily. 180 tablet 3  . digoxin (LANOXIN) 0.125 MG tablet Take 0.5 tablets (0.0625 mg total) by mouth daily. 45 tablet 3  . diltiazem (CARDIZEM CD) 120 MG 24 hr capsule Take 1 capsule (120 mg total) by mouth daily. 90 capsule 3  . diphenhydrAMINE (BENADRYL) 2 % cream Apply 1 application topically 3 (three)  times daily as needed for itching. Reported on 06/16/2015    . gabapentin (NEURONTIN) 300 MG capsule TAKE ONE CAPSULE BY MOUTH 3 TIMES A DAY 60 capsule 2  . megestrol (MEGACE) 40 MG/ML suspension Take 80 mg by mouth 2 (two) times daily.     . mirtazapine (REMERON) 15 MG tablet TAKE 1 TABLET BY MOUTH AT BEDTIME 30 tablet 5  . Naloxone HCl (EVZIO) 0.4 MG/0.4ML SOAJ Inject 0.4 mLs as directed as needed (opioid overdose). [name] 1 Package 1  . omeprazole (PRILOSEC) 40 MG capsule Take 1 capsule (40 mg total) by mouth daily. 90 capsule 3  . oxyCODONE-acetaminophen (PERCOCET) 10-325 MG tablet Take 1 tablet by mouth every 4 (four) hours as needed for pain. Do not take more than 5 doses in a 24 hour period. 150 tablet 0  . rosuvastatin (CRESTOR) 20 MG tablet Take 1 tablet (20 mg total) by mouth daily at 6 PM. 90 tablet 3  . senna-docusate ([employer] SENNA PLUS) 8.6-50 MG per tablet Take 2 tablets by mouth 2 (two) times daily. (Patient taking differently: Take 1 tablet by mouth daily as needed for moderate constipation. ) 360 tablet 3  . tiotropium (SPIRIVA) 18 MCG inhalation capsule Place 1 capsule (18 mcg total) into inhaler and inhale daily. 90 capsule 3  . Urea 37.5 % CREA Apply 1 application topically daily. (Patient taking differently: Apply 1 application topically daily as needed (for irritation). ) 142 g 0  . bisacodyl (DULCOLAX) 10 MG suppository Place 1 suppository (10 mg total) rectally daily as needed for severe constipation. (Patient not taking: Reported on 07/19/2015) 12 suppository 5  . feeding supplement, ENSURE ENLIVE, (ENSURE ENLIVE) LIQD Take 237 mLs by mouth 2 (two) times daily between meals. (Patient not taking: Reported on 07/19/2015) 237 mL 12  . metoprolol (TOPROL-XL) 200 MG 24 hr tablet Take 1 tablet (200 mg total) by mouth daily. Take with or immediately following a meal. (Patient not taking: Reported on 07/19/2015) 30 tablet 11    Allergies: Allergies as of 07/28/2015 - Review Complete  07/28/2015  Allergen Reaction Noted  . Penicillins Rash    Past Medical History  Diagnosis Date  . Hypertension   . Chronic systolic heart failure ([place])     a. NICM - EF 35% with normal cors 2003. b. Last echo 11/2012 - EF 25-30%.  . Depression   . GERD (gastroesophageal reflux disease)   . Portal vein thrombosis   . Avascular necrosis of bones of both hips (HCC)     Status post bilateral hip replacements  . Gastritis   . Alcohol abuse   . Erectile  dysfunction   . Bipolar disorder ([place])   . Hydrocele, unspecified   . Lung nodule     Chest CT scan on 12/14/2008 showed a nodular opacity at the left lung base felt to most likely represent scarring.  Follow-up chest CT scan on 06/02/2010 showed parenchymal scarring in the left apex, left  lower lobe, and lingula; some of this scarring at the left base had a nodular appearance, unchanged. Chest 09/2012 - stable.  . 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  Thyroid scan 08/28/2009 was normal with no focal areas of abnormal increased or decreased activity seen; the uptake of I 131 sodium iodide at 24 hours was 5.7%.  TSH and free T4 normalized by 08/16/2009.  . Intestinal obstruction ([place])   . COPD (chronic obstructive pulmonary disease) ([place])   . E coli bacteremia   . DVT (deep venous thrombosis) ([place])     He has hypercoagulability with multiple prior DVTs  . Pleural plaque     H/o asbestos exposure. Chest CT on 06/02/2010 showed stable extensive calcified pleural plaques involving the left hemithorax, consistent with asbestos related pleural disease.  . Weight loss     Normal colonoscopy by Dr. Olevia Perches on 11/06/2010.  . 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 b. St. Jude gen change 2010.  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  . Noncompliance   .  Thrombocytopenia ([place])   . Osteoarthritis   . Spondylosis   . Anxiety   . CKD (chronic kidney disease)     Renal U/S 12/04/2009 showed no pathological findings. Labs 12/04/2009 include normal ESR, C3, C4; neg ANA; SPEP showed nonspecific increase in the alpha-2 region with no M-spike; UPEP showed no monoclonal free light chains; urine IFE showed polyclonal increase in feree Kappa and/or free Lambda light chains. Baseline Cr reported 1.7-2.5.  . STEMI (ST elevation myocardial infarction) ([place]) 10/11/2014  . Hyperlipemia    Past Surgical History  Procedure Laterality Date  . Total hip arthroplasty Right 03/08/2008     By Dr. Hiram Comber  . Tee without cardioversion N/A 05/27/2013    Procedure: TRANSESOPHAGEAL ECHOCARDIOGRAM (TEE);  Surgeon: Candee Furbish, MD;  Location: [facility] ENDOSCOPY;  Service: Cardiovascular;  Laterality: N/A;  . Cardioversion N/A 05/27/2013    Procedure: CARDIOVERSION;  Surgeon: Candee Furbish, MD;  Location: [facility] ENDOSCOPY;  Service: Cardiovascular;  Laterality: N/A;  . Cardiac catheterization  2003    Nl cors, EF 35%  . Embolectomy Right 12/10/2013    Procedure: Right Femoral Embolectomy; Fasciotomy Right Lower Leg with Intraoperative arteriogram;  Surgeon: Rosetta Posner, MD;  Location: [place];  Service: Vascular;  Laterality: Right;  . Intraoperative arteriogram Right 12/10/2013    Procedure: INTRA OPERATIVE ARTERIOGRAM;  Surgeon: Rosetta Posner, MD;  Location: [place];  Service: Vascular;  Laterality: Right;  . Fasciotomy closure Right 12/15/2013    Procedure: LEG FASCIOTOMY CLOSURE;  Surgeon: Rosetta Posner, MD;  Location: [facility] OR;  Service: Vascular;  Laterality: Right;  . Embolectomy Left 10/12/2014    Procedure: Left Femoral Thrombectomy with intraoperative arteriogram;  Surgeon: Rosetta Posner, MD;  Location: [facility] OR;  Service: Vascular;  Laterality: Left;  . Hemiarthroplasty hip Left 09/09/2002    bipolar; Dr. Lafayette Dragon  . Joint replacement    . Insert / replace / remove pacemaker  02/2003  Dr. Roe Rutherford, [place] pulse generator identity SR model [id number] serial [id number]; gen change by Greggory Brandy  . Insert / replace / remove pacemaker  06/17/2008    Lead and generator change w/ St. Jude Medical Tendril ST model [id number] (serial  [id number]).        Family History  Problem Relation Age of Onset  . Hypertension Mother   . Cancer Brother     Unsure of type.  . Asthma Mother   . Coronary artery disease Mother   . Colon cancer Neg Hx   . Lung cancer Neg Hx   . Prostate cancer Neg Hx   . Stroke      mother  . Heart attack      brother and sister ?age    Social History   Social History  . Marital Status: Widowed    Spouse Name: N/A  . Number of Children: 10  . Years of Education: N/A   Occupational History  . RETIRED    Social History Main Topics  . Smoking status: Former Smoker -- 0.33 packs/day for 54 years    Types: Cigarettes    Quit date: 10/11/2014  . Smokeless tobacco: Never Used  . Alcohol Use: No     Comment: 6/22//2016 "h/o etoh abuse; stopped drinking ~ 2012"  . Drug Use: No     Comment: 11/09/2014 "none since 10/11/2014; did smoke marijuana ~ 1 time/month"  . Sexual Activity: Not Currently   Other Topics Concern  . Not on file   Social History Narrative   Works as a Architect part time   Smoker 1 pack last 1.5 days tobacco, smokes marijuana    Denies EtOH x 4 years (on 10/12/12)   9 kids    From [place]   [place] Veteran    12 grade education              Review of Systems: Pertinent items noted in HPI and remainder of comprehensive ROS otherwise negative.  Physical Exam: Blood pressure 136/101, pulse 131, temperature 98.7 F (37.1 C), temperature source Oral, resp. rate 27, height _0  (1.905 m), weight 165 lb 7 oz (75.042 kg), SpO2 93 %. General: alert, well-developed, and cooperative to examination.  Head: normocephalic and atraumatic.  Eyes: vision grossly intact, pupils equal, pupils round, pupils reactive to light, no  injection and anicteric.  Mouth: pharynx pink and moist, no erythema, and no exudates.  Neck: supple, full ROM, no thyromegaly, JVD, and no carotid bruits.  Lungs: tachypnea, diffuse wheezes and rhonchi, no crackles, good air movement  Heart: tachycardic, irregularly irregular rhythm, no murmur, no gallop, and no rub.  Abdomen: soft, non-tender, normal bowel sounds, no distention, no guarding, no rebound tenderness Msk: no joint swelling, no joint warmth, and no redness over joints.  Pulses: 2+ DP/PT pulses bilaterally Extremities: 2+ pitting edema to knees bilatally Neurologic: alert & oriented X3, cranial nerves II-XII intact, no focal deficits Skin: turgor normal and no rashes.  Psych: normal mood and affect  Lab results: Basic Metabolic Panel:  Recent Labs  07/28/15 1855  NA 147*  K 4.3  CL 116*  CO2 20*  GLUCOSE 93  BUN 24*  CREATININE 1.70*  CALCIUM 8.6*   CBC:  Recent Labs  07/28/15 1855  WBC 10.8*  HGB 12.7*  HCT 39.6  MCV 93.6  PLT 158   Urine Drug Screen: Drugs of Abuse     Component Value Date/Time   LABOPIA NONE DETECTED 10/11/2014  [place] 12/27/2008 [place] 10/11/2014 1724   COCAINSCRNUR NEG 07/06/2009 2225   LABBENZ NONE DETECTED 10/11/2014 1724   LABBENZ NEG 07/06/2009 2225   AMPHETMU NONE DETECTED 10/11/2014 1724   AMPHETMU NEG 07/06/2009 2225   THCU POSITIVE* 10/11/2014 1724   LABBARB NONE DETECTED 10/11/2014 1724    Imaging results:  Dg Chest 2 View  07/28/2015  CLINICAL DATA:  Shortness of breath, productive cough, tachycardia, chest pain, weakness, dizziness EXAM: CHEST  2 VIEW COMPARISON:  06/16/2015 FINDINGS: Mild patchy right lower lobe opacity, suspicious for pneumonia, less likely atelectasis. Associated small right pleural effusion. Stable scarring in the left mid lung and left lung base. Volume loss in the left hemithorax. Small left pleural effusion with apical capping. No pneumothorax. The  heart is top-normal in size.  Right subclavian pacemaker. IMPRESSION: Mild patchy right lower lobe opacity, suspicious for pneumonia, less likely atelectasis. Associated small bilateral pleural effusions. Chronic left lung scarring with volume loss. Electronically Signed   By: Julian Hy M.D.   On: 07/28/2015 20:00    Other results: EKG: atrial fibrillation, rate 144. Borderline T wave abnormalities. No significant change since last tracing.  Assessment & Plan by Problem: Active Problems:   CAP (community acquired pneumonia)  Community Acquired Pneumonia: Patient with 2 day history of shortness of breath and non-productive cough. Diffuse wheezing and rhonchi on exam. Afebrile but tachycardic in a. Fib in the ED. WBC 10.8. Lactate wnl. CXR with mild patchy right lower lobe opacity consistent wtih PNA as well as evidence of fluid overload. Patient was started on Azithromycin and Rocephin in the ED for CAP. Given 20 mg IV Lasix for volume overload. Will continue treatment for CAP. He is maintaining good O2 sats on room air with wheezes and rhonichi on exam. Patient with bilateral lower extremity edema with right slightly greater than the left but patient reports his right leg is always more edematous than his left. No calf tenderness. Patient does have a history of venous embolisms and is currently on Megase but reports compliance with Eliquis. Low suspicious for PE at this time given his clinic picture but will consider CTA if clinical picture worsens.  -Continue Azithromcyin and Ceftriaxone -Duonebs q6hr prn -Telemetry -Blood cultures drawn -Flu PCR negative -Sputum culture and gram stain -Strep pneumo urine Ag negative -Legionella Ag pending -Keep O2 sats >92% -Hold megace -CBC/BMP in am  A. Fib with RVR: Likely secondary to CAP. Patient currenlty on Eliquis 5 mg bid, cardizem 120 mg ER daily and digoxin 0.0625 mg daily at home. He had previously been on metoprolol 200 mg daily but was  stopped at 3/1 clinic visit as patient reported not taking for some time and borderline low blood pressure. Would consider restarting metoprolol at discharge.  -Eliquis 5 mg bid -Cardizem ggt -Continue digoxin  -Telemetry  Elevated Troponin with Chest Pain:  Does report non-radiating substernal chest pressure similar to when he has been in a. Fib in the past. EKG with A. Fib with RVR and no acute ischemic changes. Suspect demand ischemia secondary to atrial fibrillation with RVR.  -trend tropoinin -EKG in am -Telemetry  Systolic CHF: EF was 66-29% on 5//2016 ECHO. Patient is currently only on ASA 81 mg daily. No current ACE-i/ARB, BB or diuretics. BNP was 200 at 3/1 clinic visit. Weight was 165 lbs up from 148 lbs 2 weeks prior after starting Megace. Patient was noted to be euvolemic on exam at that time  with no crackles, LE edema or abdominal distention. It is unlikely that his weight gain over that time was due to increased appetite and it is likely that he had some degree of fluid retention at that time time. Megace can also cause significant fluid retention. His weight in the ED is up to 170 lbs and exam is notable for 2+ bilateral LE edema and BNP is up from clinic visit to 476. He did receive a dose of Lasix 20 mg IV in the ED.  -Daily weights -Strict I&Os -Reassess volume status in am, may need further diuresis  -Will consider restarting BB at discharge -Given patient's GFR in the 30s it would be difficult to start an ACE-i at this time -D/C megace  COPD: Patient uses Spiriva daily and normally uses Albuterol bid. Reports needing albuterol 5-6 times a day the past 2 days. Some symptom relief but shortness of breath has been progressively worse. Diffuse wheezes and rhonchi on exam. -Duonebs q6hr prn  CKD Stage III: Seen by Dr. Moshe Cipro. Renal function appears stable from previous records.   Chronic Pain: Patient takes Percocet 10-325 mg q4hr prn for chronic hip and low back pain,  bilateral hip replacements, and OA of multiple joints.  DVT PPx: Eliquis, SCDs  Dispo: Disposition is deferred at this time, awaiting improvement of current medical problems. Anticipated discharge in approximately 1-3 day(s).   The patient does have a current PCP (Travis Finders, MD) and does need an [facility] hospital follow-up appointment after discharge.  The patient does not have transportation limitations that hinder transportation to clinic appointments.  Signed: Maryellen Pile, MD 07/28/2015, 10:27 PM

## 2015-07-28 NOTE — Progress Notes (Signed)
MEDICATION RELATED CONSULT NOTE - INITIAL   Pharmacy Consult for antibiotic renal dosing adjustment  Allergies  Allergen Reactions  . Penicillins Rash    Patient Measurements: Height: 6' 3" (190.5 cm) Weight: 165 lb 7 oz (75.042 kg) IBW/kg (Calculated) : 84.5   Vital Signs: Temp: 98.7 F (37.1 C) (03/10 1819) Temp Source: Oral (03/10 1819) BP: 136/101 mmHg (03/10 2130) Pulse Rate: 131 (03/10 2130) Intake/Output from previous day:   Intake/Output from this shift: Total I/O In: 50 [IV Piggyback:50] Out: 900 [Urine:900]  Labs:  Recent Labs  07/28/15 1855  WBC 10.8*  HGB 12.7*  HCT 39.6  PLT 158  CREATININE 1.70*   Estimated Creatinine Clearance: 42.3 mL/min (by C-G formula based on Cr of 1.7).   Microbiology: No results found for this or any previous visit (from the past 720 hour(s)).  Medical History: Past Medical History  Diagnosis Date  . Hypertension   . Chronic systolic heart failure (HCC)     a. NICM - EF 35% with normal cors 2003. b. Last echo 11/2012 - EF 25-30%.  . Depression   . GERD (gastroesophageal reflux disease)   . Portal vein thrombosis   . Avascular necrosis of bones of both hips (HCC)     Status post bilateral hip replacements  . Gastritis   . Alcohol abuse   . Erectile dysfunction   . Bipolar disorder (H[facility])   . Hydrocele, unspecified   . Lung nodule     Chest CT scan on 12/14/2008 showed a nodular opacity at the left lung base felt to most likely represent scarring.  Follow-up chest CT scan on 06/02/2010 showed parenchymal scarring in the left apex, left  lower lobe, and lingula; some of this scarring at the left base had a nodular appearance, unchanged. Chest 09/2012 - stable.  . 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  Thyroid scan 08/28/2009 was normal with no focal areas of abnormal increased or decreased activity seen; the uptake of I 131 sodium iodide at 24 hours was 5.7%.  TSH and free T4  normalized by 08/16/2009.  . Intestinal obstruction (HCC)   . COPD (chronic obstructive pulmonary disease) (HCC)   . E coli bacteremia   . DVT (deep venous thrombosis) (H[facility])     He has hypercoagulability with multiple prior DVTs  . Pleural plaque     H/o asbestos exposure. Chest CT on 06/02/2010 showed stable extensive calcified pleural plaques involving the left hemithorax, consistent with asbestos related pleural disease.  . Weight loss     Normal colonoscopy by Dr. Brodie on 11/06/2010.  . Tachycardia-bradycardia syndrome (HCC)     a. 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 b. St. Jude gen change 2010.  . 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  . Noncompliance   . Thrombocytopenia (HCC)   . Osteoarthritis   . Spondylosis   . Anxiety   . CKD (chronic kidney disease)     Renal U/S 12/04/2009 showed no pathological findings. Labs 12/04/2009 include normal ESR, C3, C4; neg ANA; SPEP showed nonspecific increase in the alpha-2 region with no M-spike; UPEP showed no monoclonal free light chains; urine IFE showed polyclonal increase in feree Kappa and/or free Lambda light chains. Baseline Cr reported 1.7-2.5.  . STEMI (ST elevation myocardial infarction) (HCC) 10/11/2014  . Hyperlipemia     Assessment: 69 yom admitted 07/28/2015  with SOB   and AF w/ RVR.   Pharmacy consulted to renally dose adjust antibiotics. Pt currently receiving azithromycin and ceftriaxone for CAP.   WBC 10.8, afebrile  Plan:  Continue azithromycin 500mg IV Q24h  Continue ceftriaxone 1g IV Q24h  F/U c/s, LOT, clinical course    C. , PharmD Pharmacy Resident  Pager: [phone] 07/28/2015 10:30 PM      

## 2015-07-28 NOTE — Patient Outreach (Signed)
[facility]) Care Management  07/28/2015  [name] 10/11/1945 [id number]  SUBJECTIVE: Telephone call to patient regarding [employer] High risk referral. HIPAA verified with patient. Discussed and offered [facility] care management services to patient. Patient verbally agreed to received services.  Patient confirms diagnosis of heart failure, COPD, and atrial fibrillation. Patient states he has a pacemaker. Patient states he is on oxygen at 3 to 3 1/2 liters. Patient states he can do only a little activity and be out of breath and tired.   Patient states its very hard to breath sometimes. Patient states he does not weigh every day and is not on any specific diet.  Patient states he has a shower chair but states it is difficulty for him to perform his daily task due to the shortness of breath and tiredness.  Patient reports he has had 2 falls without injury over the last 3 months.  Patient states he ambulates with a 4 prong cane.  Patient states he and his son live together. Patient states he is having problems with his son.  Patient states he does not like the way his son talks to him or treats him. Patient states he wants to find another place to live. Patient states he is depressed due to his environment.  Patient states he is able to afford his medications at this time. Patient states he gets his medication with his [employer] plan. Patient states his pharmacy is [facility] on [address], [place], [place].  Patient states he drives himself to his doctor appointments.   ASSESSMENT: [employer] HMO tier 4 referral. Patient would benefit from community case management for education and management of medical conditions, nutritional education, and home safety evaluation.  Per Epic note from [name], social worker patient request assistance with housing due to not feeling safe in present living situation.  Patient will need to be referred to [facility] care management social worker for assistance with  housing and follow up to adult protective services referral.   PLAN:  RNCM will refer patient to community case Freight forwarder and Education officer, museum.   Travis Plowman RN,BSN,CCM [facility] Telephonic  [phone]

## 2015-07-28 NOTE — ED Notes (Signed)
Pt given graham crackers and Sprite  

## 2015-07-28 NOTE — ED Notes (Signed)
[facility]- pt coming from home with shortness of breath X2 days. Hx of CHF, COPD and A-fib. Pt also reports head/neck pain and chest pain with deep breathing. Pt also has a cough. Pt initially 90% on RA and placed on 12L nasal cannula.

## 2015-07-28 NOTE — ED Notes (Signed)
Attempted to get actual weight - pt refused to stand up, stating "my legs don't work".  Portable scale is at bed side for another attempt at a later time.

## 2015-07-29 ENCOUNTER — Inpatient Hospital Stay (HOSPITAL_COMMUNITY): Payer: Commercial Managed Care - HMO

## 2015-07-29 DIAGNOSIS — R0789 Other chest pain: Secondary | ICD-10-CM

## 2015-07-29 DIAGNOSIS — R778 Other specified abnormalities of plasma proteins: Secondary | ICD-10-CM

## 2015-07-29 LAB — TROPONIN I
TROPONIN I: 0.06 ng/mL — AB (ref <0.031)
TROPONIN I: 0.08 ng/mL — AB (ref <0.031)
Troponin I: 0.06 ng/mL — ABNORMAL HIGH (ref <0.031)

## 2015-07-29 LAB — INFLUENZA PANEL BY PCR (TYPE A & B)
H1N1 flu by pcr: NOT DETECTED
INFLAPCR: NEGATIVE
Influenza B By PCR: NEGATIVE

## 2015-07-29 LAB — EXPECTORATED SPUTUM ASSESSMENT W GRAM STAIN, RFLX TO RESP C

## 2015-07-29 LAB — CBC WITH DIFFERENTIAL/PLATELET
Basophils Absolute: 0 10*3/uL (ref 0.0–0.1)
Basophils Relative: 0 %
EOS ABS: 0.3 10*3/uL (ref 0.0–0.7)
EOS PCT: 3 %
HEMATOCRIT: 37.4 % — AB (ref 39.0–52.0)
HEMOGLOBIN: 12.2 g/dL — AB (ref 13.0–17.0)
LYMPHS ABS: 1.5 10*3/uL (ref 0.7–4.0)
Lymphocytes Relative: 17 %
MCH: 30.2 pg (ref 26.0–34.0)
MCHC: 32.6 g/dL (ref 30.0–36.0)
MCV: 92.6 fL (ref 78.0–100.0)
MONO ABS: 1.1 10*3/uL — AB (ref 0.1–1.0)
Monocytes Relative: 12 %
Neutro Abs: 6.1 10*3/uL (ref 1.7–7.7)
Neutrophils Relative %: 68 %
Platelets: 175 10*3/uL (ref 150–400)
RBC: 4.04 MIL/uL — AB (ref 4.22–5.81)
RDW: 15.7 % — ABNORMAL HIGH (ref 11.5–15.5)
WBC: 9 10*3/uL (ref 4.0–10.5)

## 2015-07-29 LAB — PROCALCITONIN: Procalcitonin: 0.4 ng/mL

## 2015-07-29 LAB — BASIC METABOLIC PANEL
Anion gap: 11 (ref 5–15)
BUN: 23 mg/dL — AB (ref 6–20)
CALCIUM: 8.3 mg/dL — AB (ref 8.9–10.3)
CHLORIDE: 114 mmol/L — AB (ref 101–111)
CO2: 18 mmol/L — ABNORMAL LOW (ref 22–32)
CREATININE: 1.6 mg/dL — AB (ref 0.61–1.24)
GFR calc Af Amer: 48 mL/min — ABNORMAL LOW (ref >60)
GFR, EST NON AFRICAN AMERICAN: 42 mL/min — AB (ref >60)
Glucose, Bld: 143 mg/dL — ABNORMAL HIGH (ref 65–99)
Potassium: 4 mmol/L (ref 3.5–5.1)
SODIUM: 143 mmol/L (ref 135–145)

## 2015-07-29 LAB — MRSA PCR SCREENING: MRSA BY PCR: POSITIVE — AB

## 2015-07-29 LAB — D-DIMER, QUANTITATIVE: D-Dimer, Quant: 1.82 ug/mL-FEU — ABNORMAL HIGH (ref 0.00–0.50)

## 2015-07-29 LAB — STREP PNEUMONIAE URINARY ANTIGEN: Strep Pneumo Urinary Antigen: NEGATIVE

## 2015-07-29 LAB — EXPECTORATED SPUTUM ASSESSMENT W REFEX TO RESP CULTURE

## 2015-07-29 MED ORDER — METHYLPREDNISOLONE SODIUM SUCC 125 MG IJ SOLR
60.0000 mg | Freq: Two times a day (BID) | INTRAMUSCULAR | Status: DC
  Administered 2015-07-29 – 2015-07-30 (×2): 60 mg via INTRAVENOUS
  Filled 2015-07-29 (×2): qty 2

## 2015-07-29 MED ORDER — GUAIFENESIN ER 600 MG PO TB12
600.0000 mg | ORAL_TABLET | Freq: Two times a day (BID) | ORAL | Status: DC
  Administered 2015-07-29 – 2015-08-03 (×12): 600 mg via ORAL
  Filled 2015-07-29 (×12): qty 1

## 2015-07-29 MED ORDER — METOPROLOL TARTRATE 50 MG PO TABS: 75.0000 mg | ORAL_TABLET | Freq: Two times a day (BID) | ORAL | Status: DC

## 2015-07-29 MED ORDER — BOOST / RESOURCE BREEZE PO LIQD: 1.0000 | ORAL | Status: DC | PRN

## 2015-07-29 MED ORDER — METOPROLOL TARTRATE 50 MG PO TABS
50.0000 mg | ORAL_TABLET | Freq: Two times a day (BID) | ORAL | Status: DC
  Administered 2015-07-29: 50 mg via ORAL
  Filled 2015-07-29: qty 1

## 2015-07-29 MED ORDER — ASPIRIN 81 MG PO CHEW
81.0000 mg | CHEWABLE_TABLET | Freq: Every day | ORAL | Status: DC
  Administered 2015-07-29 – 2015-07-30 (×2): 81 mg via ORAL
  Filled 2015-07-29 (×2): qty 1

## 2015-07-29 MED ORDER — SENNOSIDES-DOCUSATE SODIUM 8.6-50 MG PO TABS: 1.0000 | ORAL_TABLET | Freq: Every day | ORAL | Status: DC | PRN

## 2015-07-29 MED ORDER — OXYCODONE-ACETAMINOPHEN 5-325 MG PO TABS
1.0000 | ORAL_TABLET | ORAL | Status: DC | PRN
  Administered 2015-07-29 – 2015-08-08 (×20): 1 via ORAL
  Filled 2015-07-29 (×21): qty 1

## 2015-07-29 MED ORDER — DILTIAZEM HCL ER COATED BEADS 120 MG PO CP24: 120.0000 mg | ORAL_CAPSULE | Freq: Every day | ORAL | Status: DC

## 2015-07-29 MED ORDER — ROSUVASTATIN CALCIUM 20 MG PO TABS
20.0000 mg | ORAL_TABLET | Freq: Every day | ORAL | Status: DC
  Administered 2015-07-29 – 2015-08-02 (×5): 20 mg via ORAL
  Filled 2015-07-29 (×5): qty 1

## 2015-07-29 MED ORDER — OXYCODONE-ACETAMINOPHEN 10-325 MG PO TABS
1.0000 | ORAL_TABLET | ORAL | Status: DC | PRN
Start: 2015-07-29 — End: 2015-07-29

## 2015-07-29 MED ORDER — FUROSEMIDE 10 MG/ML IJ SOLN
20.0000 mg | Freq: Once | INTRAMUSCULAR | Status: AC
  Administered 2015-07-29: 20 mg via INTRAVENOUS
  Filled 2015-07-29: qty 2

## 2015-07-29 MED ORDER — GUAIFENESIN ER 600 MG PO TB12: 600.0000 mg | ORAL_TABLET | Freq: Two times a day (BID) | ORAL | Status: DC | PRN

## 2015-07-29 MED ORDER — DILTIAZEM HCL ER COATED BEADS 120 MG PO CP24
120.0000 mg | ORAL_CAPSULE | Freq: Every day | ORAL | Status: DC
  Administered 2015-07-29 – 2015-07-30 (×2): 120 mg via ORAL
  Filled 2015-07-29 (×2): qty 1

## 2015-07-29 MED ORDER — IPRATROPIUM BROMIDE 0.02 % IN SOLN
0.5000 mg | Freq: Four times a day (QID) | RESPIRATORY_TRACT | Status: DC
  Administered 2015-07-29 – 2015-07-31 (×5): 0.5 mg via RESPIRATORY_TRACT
  Filled 2015-07-29 (×6): qty 2.5

## 2015-07-29 MED ORDER — DIGOXIN 125 MCG PO TABS
0.0625 mg | ORAL_TABLET | Freq: Every day | ORAL | Status: DC
  Administered 2015-07-29 – 2015-08-03 (×6): 0.0625 mg via ORAL
  Filled 2015-07-29 (×6): qty 1

## 2015-07-29 MED ORDER — LEVALBUTEROL HCL 0.63 MG/3ML IN NEBU
0.6300 mg | INHALATION_SOLUTION | RESPIRATORY_TRACT | Status: DC | PRN
  Administered 2015-07-29 – 2015-08-04 (×4): 0.63 mg via RESPIRATORY_TRACT
  Filled 2015-07-29 (×4): qty 3

## 2015-07-29 MED ORDER — AZITHROMYCIN 250 MG PO TABS
250.0000 mg | ORAL_TABLET | Freq: Every day | ORAL | Status: AC
  Administered 2015-07-29 – 2015-08-01 (×4): 250 mg via ORAL
  Filled 2015-07-29 (×4): qty 1

## 2015-07-29 MED ORDER — METOPROLOL TARTRATE 50 MG PO TABS
50.0000 mg | ORAL_TABLET | Freq: Two times a day (BID) | ORAL | Status: DC
  Administered 2015-07-29 – 2015-07-30 (×2): 50 mg via ORAL
  Filled 2015-07-29 (×2): qty 1

## 2015-07-29 MED ORDER — BOOST / RESOURCE BREEZE PO LIQD
1.0000 | Freq: Three times a day (TID) | ORAL | Status: DC
  Administered 2015-07-29 – 2015-08-08 (×20): 1 via ORAL

## 2015-07-29 MED ORDER — MUPIROCIN 2 % EX OINT
1.0000 "application " | TOPICAL_OINTMENT | Freq: Two times a day (BID) | CUTANEOUS | Status: AC
  Administered 2015-07-29 – 2015-08-02 (×10): 1 via NASAL
  Filled 2015-07-29 (×3): qty 22

## 2015-07-29 MED ORDER — LEVALBUTEROL HCL 0.63 MG/3ML IN NEBU
0.6300 mg | INHALATION_SOLUTION | Freq: Four times a day (QID) | RESPIRATORY_TRACT | Status: DC
  Administered 2015-07-29 – 2015-07-31 (×5): 0.63 mg via RESPIRATORY_TRACT
  Filled 2015-07-29 (×6): qty 3

## 2015-07-29 MED ORDER — CHLORHEXIDINE GLUCONATE CLOTH 2 % EX PADS
6.0000 | MEDICATED_PAD | Freq: Every day | CUTANEOUS | Status: AC
  Administered 2015-07-29 – 2015-08-02 (×5): 6 via TOPICAL

## 2015-07-29 MED ORDER — PANTOPRAZOLE SODIUM 40 MG PO TBEC
80.0000 mg | DELAYED_RELEASE_TABLET | Freq: Every day | ORAL | Status: DC
  Administered 2015-07-29 – 2015-08-03 (×6): 80 mg via ORAL
  Filled 2015-07-29 (×6): qty 2

## 2015-07-29 MED ORDER — NALOXONE HCL 0.4 MG/0.4ML IJ SOAJ: 0.4000 mL | INTRAMUSCULAR | Status: DC | PRN

## 2015-07-29 MED ORDER — OXYCODONE HCL 5 MG PO TABS
5.0000 mg | ORAL_TABLET | ORAL | Status: DC | PRN
  Administered 2015-07-29 – 2015-08-08 (×18): 5 mg via ORAL
  Filled 2015-07-29 (×18): qty 1

## 2015-07-29 NOTE — Progress Notes (Addendum)
Initial Nutrition Assessment  DOCUMENTATION CODES:   Severe malnutrition in context of chronic illness  INTERVENTION:    Boost Breeze PO TID, each supplement provides 250 kcal and 9 grams of protein, patient likes wild berry flavor.  NUTRITION DIAGNOSIS:   Malnutrition related to chronic illness as evidenced by severe depletion of muscle mass, moderate depletion of body fat, moderate depletions of muscle mass.  GOAL:   Patient will meet greater than or equal to 90% of their needs  MONITOR:   PO intake, Supplement acceptance, Skin, I & O's  REASON FOR ASSESSMENT:   Malnutrition Screening Tool    ASSESSMENT:   70 year old male with a past medical history of systolic CHF EF [id number], ETOH abuse, COPD, CKD III, CAD presenting with complaints of shortness of breath, cough and chills.   Patient reports that he has been eating fair lately. He ate all of his breakfast today because he ate nothing yesterday. His appetite is fair. He used to drink Ensure supplements until his doctor suggested he stop because his potassium was elevated. He has had Boost Breeze in the past and liked the berry flavor. Weight is above stated usual weight of 145 lbs, patient thinks it is all from fluids.  Nutrition-Focused physical exam completed. Findings are moderate fat depletion, severe muscle depletion, and no edema.   Diet Order:  Diet regular Room service appropriate?: Yes; Fluid consistency:: Thin  Skin:  Reviewed, no issues  Last BM:  3/10  Height:   Ht Readings from Last 1 Encounters:  07/29/15 6\' 3"  (1.905 m)    Weight:   Wt Readings from Last 1 Encounters:  07/29/15 170 lb 10.2 oz (77.4 kg)    Ideal Body Weight:  89.1 kg  BMI:  Body mass index is 21.33 kg/(m^2).  Estimated Nutritional Needs:   Kcal:  2000-2200  Protein:  90-100 gm  Fluid:  2 L  EDUCATION NEEDS:   No education needs identified at this time  Molli Barrows, [place], [place], [place] Pager [phone] After Hours  Pager [phone]

## 2015-07-29 NOTE — Progress Notes (Addendum)
Subjective: Patient states he began feeling more short of breath than his baseline a few days ago. Usually can walk short distances, transferring out of wheelchair, but says this is worsened. He reports cough productive of some white phlegm. He also says he noticed swelling in his feet and legs which is improving this morning. He says he is urinating well after receiving Lasix yesterday. I saw him in clinic on 07/19/2015. He had been on Megace 800 mg BID and gained 14 lbs over 2 weeks. At that time he did not appear volume overloaded on exam or based on symptom, thought this was a concern. His Megace was decreased to 80 mg BID at that time, but he admits that he has not taken it at all since then.   Objective: Vital signs in last 24 hours: Filed Vitals:   07/29/15 1100 07/29/15 1200 07/29/15 1253 07/29/15 1300  BP: 117/82 111/90 118/73 115/78  Pulse: 142 88 118 77  Temp:    98.7 F (37.1 C)  TempSrc:    Oral  Resp: 19 22 29 16   Height:      Weight:      SpO2: 98% 94% 93% 95%   Weight change:   Intake/Output Summary (Last 24 hours) at 07/29/15 1358 Last data filed at 07/29/15 1324  Gross per 24 hour  Intake 2059.92 ml  Output   2675 ml  Net -615.08 ml   General: resting in bed Cardiac: irregularly irregular, tachycardic rate 100-120s Pulm: expiratory wheezing bilateral lung fields, no crackles heard, moving normal volumes of air Abd: soft, nontender, some distension, BS present Ext: warm and well perfused, no pitting edema appreciated Neuro: alert and oriented X3  Assessment/Plan: Principal Problem:   CAP (community acquired pneumonia) Active Problems:   Essential hypertension   BRADYCARDIA-TACHYCARDIA SYNDROME   COPD (chronic obstructive pulmonary disease) (HCC)   G E R D   Thrombosis, portal vein   Depression   Chronic kidney disease (CKD), stage III (moderate)   Chronic combined systolic and diastolic congestive heart failure (HCC)   Longstanding persistent atrial  fibrillation (HCC)   Protein-calorie malnutrition, severe (HCC)   Chronic pain   Atrial fibrillation with RVR (HCC)   Acute systolic congestive heart failure (HCC)  A. Fib with RVR: Likely related to CAP. Patient currenlty on Eliquis 5 mg bid, cardizem 120 mg ER daily and digoxin 0.0625 mg daily at home. He had previously been on metoprolol 200 mg daily but says he has not taken it in so long that he does not remember the last time he took it. Patient continues to be in Afib with rate hovering around low 100s with occasional jump to 120s on Cardizem drip. Will attempt transition to oral medications for rate control. -Eliquis 5 mg bid -Continue Diltiazem ggt, titrate down -Start Diltiazem 120 mg po daily -Start Metoprolol 50 mg po BID -Continue Digoxin 0.0625 mg po qd -Telemetry -Consider cardiology consult if control not achieved  Systolic CHF: EF was [id number] on 5//2016 ECHO. BNP was 200 on 3/1 clinic visit. His weight in the ED was 165 lbs and 170 lbs a few hours apart. He was noted to have 2+ bilateral LE edema on admission. BNP is up to 476. He received Lasix 20 mg IV in the ED with good UOP of nearly 2L. No appreciable pitting edema or crackles on exam this morning, but abdomen is distended and he likely has extra fluid on board. Troponins are slightly elevated, likely related to demand ischemia.  Will continue with IV lasix today. -IV Lasix 20 mg once today -f/u repeat troponin -Daily weights -Strict I&Os -Started on Metoprolol 50 mg BID -D/C megace  Questionable Community Acquired Pneumonia: Patient with 2 day history of shortness of breath and non-productive cough. CXR with mild patchy right lower lobe opacity suspicious of PNA as well as evidence of fluid overload. Patient was started on Azithromycin and Rocephin in the ED for CAP. Continuing treatment of CAP with 5 total days Azithromycin.  -Continue oral Azithromcyin 4 more days (total 5 days 3/10>>3/14) -d/c Ceftriaxone -Duonebs  q6hr prn -f/u Sputum culture and gram stain -f/u Legionella Ag pending  COPD: Patient uses Spiriva daily and normally uses Albuterol bid. Reports increased need for albuterol the past 2 days with some relief. Has expiratory wheezing on exam today. -Duonebs q6hr prn  Chronic Pain:  -Percocet 10-325 mg q4hr prn for chronic hip and low back pain, bilateral hip replacements, and OA of multiple joints.   Dispo: Disposition is deferred at this time, awaiting improvement of current medical problems.  Anticipated discharge in approximately 1-3 day(s).        LOS: 1 day   Zada Finders, MD 07/29/2015, 1:58 PM

## 2015-07-29 NOTE — Progress Notes (Addendum)
Was advised by the day team to go evaluate the patient. In the setting of a worsening CXR today, a d-dimer was ordered, which was found to be elevated, but actually lower than a previous reading in the context of no DVT or PE. Other than some anxiety, the patient had no acute complaints in the room. He indicated that he is typically on 3L O2 at home "as needed." He was noted to be satting 95% on 3LNC. He denied any chest pain, pain in his legs, or leg swelling. The patient confirmed adamantly that he had been taking his Eliquis prior to admission twice daily.  Filed Vitals:   07/29/15 1800 07/29/15 1900  BP: 115/92 132/95  Pulse: 55 125  Temp:  98 F (36.7 C)  Resp: 22 19   Physical exam:  General: No acute distress, resting comfortably Cardiovascular: Tachycardic. Irregularly irregular rhythm. No loud S2 Pulmonary: Rhonchorous. Scattered wheezes. No crackles Extremities: No edema. No calf pain or tenderness. No palpable cords.    A/P  In the setting of current anticoagulation, I do not thing further diagnostic studies to query a PE would be useful at this time. With respect to further management, the questions is not whether he is having a PE, but whether he is having a PE that would require thrombolysis, since he is already on Eliquis. Other than his alternating tachycardia/bradychardia, his vitals are stable, he denies any chest pain, and his oxygen requirements are stable. He has a known history of tachy-brady syndrome and has a [employer] pacemaker in place. In July 2016, Mr. Lapan was seen by Dr. Rayann Heman who indicated that his rates were a "little better" and had normal pacemaker function. Therefore, we have called [employer] who will interrogate his pacemaker function.

## 2015-07-29 NOTE — Progress Notes (Signed)
RT called to bedside to assess patient who was complaining of SOB. Patient was tachypneic, labored, and diaphoretic. RT attempted to place patient on BiPAP, but patient refused and stated that it was hard for him to breathe with it. Patient placed back on [place] with O2 saturations of 91-92%. Patient was given PRN breathing treatment and assessed per protocol. Patient placed on Q6 Xopenex and atrovent and Q4PRN xopenex. Patient WOB improved. RT will continue to monitor patient.

## 2015-07-29 NOTE — Progress Notes (Signed)
Pt refusing to have labs drawn.

## 2015-07-29 NOTE — Progress Notes (Signed)
Around 1400 patient called out c/o SOB. On assessment, pt diaphoretic, sats 91% on RA, tachy RR 25-29, using abd muscles to breath, states "I need some oxygen or im going to pass out." 2L [place] applied for patient comfort. Lungs coarse sounding does not sound wet. RT called to come assess patient. MD called due to change in patients status. New orders received.

## 2015-07-29 NOTE — Progress Notes (Signed)
Paged about patient having increased oxygen requirement and dyspnea.  S: Reports dyspnea. Improved with sitting up.  O: Blood pressure 148/101, pulse 112, temperature 98.7 F (37.1 C), temperature source Oral, resp. rate 28, height 6\' 3"  (1.905 m), weight 170 lb 10.2 oz (77.4 kg), SpO2 92 %.  General Apperance: moderate respiratory distress Neck: Supple, trachea midline Lungs: Course rhonchi and scattered wheezes throughout. Heart: Irreg irreg Neurologic: Alert and interactive. No gross deficits.  A/P: Given breathing treatment with some improvement. Coughing up large amount of grey-tan thick sputum.  -CXR pending -COPD exacerbation: Start Solumedrol 60mg  BID. Titrate tomorrow. Xopenex/ipatropium Q6hr. -Supplemental oxygen prn to keep sat between 88-92% -BiPAP as tolerated -Flutter valve -If he deteriorates further, consider escalating abx (add vanc? MRSA PCR positive. Ceftriaxone?), consulting PCCM +/- d-dimer (moderate risk by Wells Score for PE, on DOAC but uncertain about his compliance).  Jacques Earthly, MD  Internal Medicine Teaching Service PGY-2

## 2015-07-30 DIAGNOSIS — I5021 Acute systolic (congestive) heart failure: Secondary | ICD-10-CM

## 2015-07-30 DIAGNOSIS — I10 Essential (primary) hypertension: Secondary | ICD-10-CM

## 2015-07-30 DIAGNOSIS — I4891 Unspecified atrial fibrillation: Secondary | ICD-10-CM

## 2015-07-30 LAB — CBC
HEMATOCRIT: 38.4 % — AB (ref 39.0–52.0)
HEMOGLOBIN: 12.2 g/dL — AB (ref 13.0–17.0)
MCH: 29.3 pg (ref 26.0–34.0)
MCHC: 31.8 g/dL (ref 30.0–36.0)
MCV: 92.1 fL (ref 78.0–100.0)
Platelets: 179 10*3/uL (ref 150–400)
RBC: 4.17 MIL/uL — ABNORMAL LOW (ref 4.22–5.81)
RDW: 15.5 % (ref 11.5–15.5)
WBC: 6.5 10*3/uL (ref 4.0–10.5)

## 2015-07-30 LAB — BASIC METABOLIC PANEL
ANION GAP: 11 (ref 5–15)
BUN: 32 mg/dL — AB (ref 6–20)
CALCIUM: 8.7 mg/dL — AB (ref 8.9–10.3)
CO2: 21 mmol/L — AB (ref 22–32)
Chloride: 111 mmol/L (ref 101–111)
Creatinine, Ser: 2.21 mg/dL — ABNORMAL HIGH (ref 0.61–1.24)
GFR calc Af Amer: 33 mL/min — ABNORMAL LOW (ref >60)
GFR, EST NON AFRICAN AMERICAN: 28 mL/min — AB (ref >60)
GLUCOSE: 164 mg/dL — AB (ref 65–99)
Potassium: 5 mmol/L (ref 3.5–5.1)
Sodium: 143 mmol/L (ref 135–145)

## 2015-07-30 LAB — TROPONIN I: TROPONIN I: 0.05 ng/mL — AB (ref <0.031)

## 2015-07-30 MED ORDER — FUROSEMIDE 10 MG/ML IJ SOLN
40.0000 mg | Freq: Two times a day (BID) | INTRAMUSCULAR | Status: DC
  Administered 2015-07-30 – 2015-07-31 (×2): 40 mg via INTRAVENOUS
  Filled 2015-07-30 (×3): qty 4

## 2015-07-30 MED ORDER — FUROSEMIDE 10 MG/ML IJ SOLN: 20.0000 mg | Freq: Four times a day (QID) | INTRAMUSCULAR | Status: DC

## 2015-07-30 MED ORDER — METOPROLOL TARTRATE 50 MG PO TABS: 75.0000 mg | ORAL_TABLET | Freq: Two times a day (BID) | ORAL | Status: DC

## 2015-07-30 MED ORDER — ISOSORBIDE MONONITRATE ER 30 MG PO TB24
15.0000 mg | ORAL_TABLET | Freq: Every day | ORAL | Status: DC
  Administered 2015-07-30 – 2015-08-03 (×5): 15 mg via ORAL
  Filled 2015-07-30 (×5): qty 1

## 2015-07-30 MED ORDER — HYDRALAZINE HCL 10 MG PO TABS
10.0000 mg | ORAL_TABLET | Freq: Three times a day (TID) | ORAL | Status: DC
  Administered 2015-07-30 – 2015-08-02 (×11): 10 mg via ORAL
  Filled 2015-07-30 (×11): qty 1

## 2015-07-30 MED ORDER — PREDNISONE 20 MG PO TABS
40.0000 mg | ORAL_TABLET | Freq: Every day | ORAL | Status: DC
  Administered 2015-07-30 – 2015-08-01 (×3): 40 mg via ORAL
  Filled 2015-07-30 (×3): qty 2

## 2015-07-30 MED ORDER — DILTIAZEM HCL 100 MG IV SOLR
5.0000 mg/h | INTRAVENOUS | Status: DC
  Administered 2015-07-30: 5 mg/h via INTRAVENOUS
  Administered 2015-07-31 (×2): 10 mg/h via INTRAVENOUS
  Administered 2015-08-01 (×2): 15 mg/h via INTRAVENOUS
  Administered 2015-08-02 – 2015-08-04 (×11): 20 mg/h via INTRAVENOUS
  Administered 2015-08-04 (×2): 15 mg/h via INTRAVENOUS
  Administered 2015-08-04: 20 mg/h via INTRAVENOUS
  Administered 2015-08-04: 15 mg/h via INTRAVENOUS
  Administered 2015-08-05: 7.5 mg/h via INTRAVENOUS
  Administered 2015-08-05: 15 mg/h via INTRAVENOUS
  Administered 2015-08-05: 10 mg/h via INTRAVENOUS
  Administered 2015-08-06: 7.5 mg/h via INTRAVENOUS
  Filled 2015-07-30 (×25): qty 100

## 2015-07-30 MED ORDER — CETYLPYRIDINIUM CHLORIDE 0.05 % MT LIQD
7.0000 mL | Freq: Two times a day (BID) | OROMUCOSAL | Status: DC
  Administered 2015-07-30 – 2015-08-08 (×14): 7 mL via OROMUCOSAL

## 2015-07-30 NOTE — Consult Note (Addendum)
Admit date: 07/28/2015 Referring Physician  Dr. Tommy Medal Primary Physician  [name] Primary Cardiologist  Dr.  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 He reports for the past two days he has had worsening shortness of breath with associated nonproductive cough. He reports that he has also had chest palpations with substernal chest pressure since his shortness of breath began. The pain does not radiate anywhere and is constant. Reports similar pain in the past with atrial fibrillation. Denies any fevers but reports chills. Reports some improvement in his shortness of breath with albuterol treatment at home but symptoms have gotten progressively worse. Repeats headache and lightheadedness and dizziness upon standing. Does note worsening bilateral lower extremity edema the past 2 days but no leg pain. No vision changes, nausea/vomiting/diarrhea, abdominal pain or myalgias. Says his girlfriend was diagnosed with the flu 2 days ago before his symptoms began.   He was found to be in atrial fibrillation with RVR rates between 140-160 on arrival to the ED. He is on digoxin, diltiazem and eliquis for his atrial fibrillation. Reports compliance with his medications. Of note, [name]. This was decreased to 80 mg bid in clinic on 3/1. Patient does have a history of multiple venous clots in the past. Cardiology is now asked to evaluate to help with rate control of atrial fibrillation.       PMH:   Past Medical History  Diagnosis Date  . Hypertension   . Chronic systolic heart failure ([place])     a. NICM - EF 35% with  normal cors 2003. b. Last echo 11/2012 - EF 25-30%.  . Depression   . GERD (gastroesophageal reflux disease)   . Portal vein thrombosis   . Avascular necrosis of bones of both hips ([facility])     Status post bilateral hip replacements  . Gastritis   . Alcohol abuse   . Erectile dysfunction   . Bipolar disorder ([place])   . Hydrocele, unspecified   . Lung nodule     Chest CT scan on 12/14/2008 showed a nodular opacity at the left lung base felt to most likely represent scarring.  Follow-up chest CT scan on 06/02/2010 showed parenchymal scarring in the left apex, left  lower lobe, and lingula; some of this scarring at the left base had a nodular appearance, unchanged. Chest 09/2012 - stable.  . 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  Thyroid scan 08/28/2009 was normal with no focal areas of abnormal increased or decreased activity seen; the uptake of I 131 sodium iodide at 24 hours was 5.7%.  TSH and free T4 normalized by 08/16/2009.  . Intestinal obstruction ([place])   . COPD (chronic obstructive pulmonary disease) ([place])   . E coli bacteremia   . DVT (deep venous thrombosis) ([place])     He has hypercoagulability with multiple prior DVTs  . Pleural plaque     H/o asbestos exposure. Chest CT on 06/02/2010 showed stable extensive calcified pleural plaques involving the left hemithorax, consistent with asbestos related pleural disease.  . Weight loss  Normal colonoscopy by Dr. Olevia Perches on 11/06/2010.  . Tachycardia-bradycardia syndrome [facility])     a. 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 b. St. Jude gen change 2010.  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  . Noncompliance   . Thrombocytopenia ([place])   . Osteoarthritis   . Spondylosis   . Anxiety   . CKD (chronic kidney disease)     Renal U/S 12/04/2009 showed no pathological findings. Labs 12/04/2009 include  normal ESR, C3, C4; neg ANA; SPEP showed nonspecific increase in the alpha-2 region with no M-spike; UPEP showed no monoclonal free light chains; urine IFE showed polyclonal increase in feree Kappa and/or free Lambda light chains. Baseline Cr reported 1.7-2.5.  . STEMI (ST elevation myocardial infarction) ([place]) 10/11/2014  . Hyperlipemia      PSH:   Past Surgical History  Procedure Laterality Date  . Total hip arthroplasty Right 03/08/2008     By Dr. Hiram Comber  . Tee without cardioversion N/A 05/27/2013    Procedure: TRANSESOPHAGEAL ECHOCARDIOGRAM (TEE);  Surgeon: Candee Furbish, MD;  Location: [facility] ENDOSCOPY;  Service: Cardiovascular;  Laterality: N/A;  . Cardioversion N/A 05/27/2013    Procedure: CARDIOVERSION;  Surgeon: Candee Furbish, MD;  Location: [facility] ENDOSCOPY;  Service: Cardiovascular;  Laterality: N/A;  . Cardiac catheterization  2003    Nl cors, EF 35%  . Embolectomy Right 12/10/2013    Procedure: Right Femoral Embolectomy; Fasciotomy Right Lower Leg with Intraoperative arteriogram;  Surgeon: Rosetta Posner, MD;  Location: [place];  Service: Vascular;  Laterality: Right;  . Intraoperative arteriogram Right 12/10/2013    Procedure: INTRA OPERATIVE ARTERIOGRAM;  Surgeon: Rosetta Posner, MD;  Location: [place];  Service: Vascular;  Laterality: Right;  . Fasciotomy closure Right 12/15/2013    Procedure: LEG FASCIOTOMY CLOSURE;  Surgeon: Rosetta Posner, MD;  Location: [facility] OR;  Service: Vascular;  Laterality: Right;  . Embolectomy Left 10/12/2014    Procedure: Left Femoral Thrombectomy with intraoperative arteriogram;  Surgeon: Rosetta Posner, MD;  Location: [facility] OR;  Service: Vascular;  Laterality: Left;  . Hemiarthroplasty hip Left 09/09/2002    bipolar; Dr. [place] Dragon  . Joint replacement    . Insert / replace / remove pacemaker  02/2003    Dr. Roe Rutherford, [place] pulse generator identity SR model [id number] serial [id number]; gen change by Greggory Brandy  . Insert / replace / remove pacemaker  06/17/2008    Lead and  generator change w/ St. Jude Medical Tendril ST model [id number] (serial  [id number]).         Allergies:  Penicillins Prior to Admit Meds:   Prescriptions prior to admission  Medication Sig Dispense Refill Last Dose  . albuterol (VENTOLIN HFA) 108 (90 BASE) MCG/ACT inhaler Inhale 2 puffs into the lungs every 6 (six) hours as needed for wheezing or shortness of breath. 3 Inhaler 3 07/28/2015 at Unknown time  . apixaban (ELIQUIS) 5 MG TABS tablet Take 1 tablet (5 mg total) by mouth 2 (two) times daily. 180 tablet 3 07/28/2015 at 0930  . aspirin 81 MG chewable tablet Chew 1 tablet (81 mg total) by mouth daily. 30 tablet 12 07/28/2015 at Unknown time  . buPROPion (WELLBUTRIN) 75 MG tablet Take 1 tablet (75 mg total) by mouth 2 (two) times daily. 180 tablet 3 07/28/2015 at am  . digoxin (LANOXIN) 0.125 MG tablet Take 0.5 tablets (0.0625 mg total) by mouth daily.  45 tablet 3 07/28/2015 at Unknown time  . diltiazem (CARDIZEM CD) 120 MG 24 hr capsule Take 1 capsule (120 mg total) by mouth daily. 90 capsule 3 07/28/2015 at Unknown time  . diphenhydrAMINE (BENADRYL) 2 % cream Apply 1 application topically 3 (three) times daily as needed for itching. Reported on 06/16/2015   Past Month at Unknown time  . gabapentin (NEURONTIN) 300 MG capsule TAKE ONE CAPSULE BY MOUTH 3 TIMES A DAY 60 capsule 2 07/28/2015 at 1 dose  . megestrol (MEGACE) 40 MG/ML suspension Take 80 mg by mouth 2 (two) times daily.    07/28/2015 at am  . mirtazapine (REMERON) 15 MG tablet TAKE 1 TABLET BY MOUTH AT BEDTIME 30 tablet 5 07/27/2015 at Unknown time  . Naloxone HCl (EVZIO) 0.4 MG/0.4ML SOAJ Inject 0.4 mLs as directed as needed (opioid overdose). [name] 1 Package 1 not used  . omeprazole (PRILOSEC) 40 MG capsule Take 1 capsule (40 mg total) by mouth daily. 90 capsule 3 07/28/2015 at Unknown time  . oxyCODONE-acetaminophen (PERCOCET) 10-325 MG tablet Take 1 tablet by mouth every 4 (four) hours as needed for pain. Do not take more than 5  doses in a 24 hour period. 150 tablet 0 07/28/2015 at Unknown time  . rosuvastatin (CRESTOR) 20 MG tablet Take 1 tablet (20 mg total) by mouth daily at 6 PM. 90 tablet 3 07/27/2015 at Unknown time  . senna-docusate ([employer] SENNA PLUS) 8.6-50 MG per tablet Take 2 tablets by mouth 2 (two) times daily. (Patient taking differently: Take 1 tablet by mouth daily as needed for moderate constipation. ) 360 tablet 3 Past Week at Unknown time  . tiotropium (SPIRIVA) 18 MCG inhalation capsule Place 1 capsule (18 mcg total) into inhaler and inhale daily. 90 capsule 3 07/28/2015 at Unknown time  . Urea 37.5 % CREA Apply 1 application topically daily. (Patient taking differently: Apply 1 application topically daily as needed (for irritation). ) 142 g 0 Past Month at Unknown time  . bisacodyl (DULCOLAX) 10 MG suppository Place 1 suppository (10 mg total) rectally daily as needed for severe constipation. (Patient not taking: Reported on 07/19/2015) 12 suppository 5 Not Taking  . feeding supplement, ENSURE ENLIVE, (ENSURE ENLIVE) LIQD Take 237 mLs by mouth 2 (two) times daily between meals. (Patient not taking: Reported on 07/19/2015) 237 mL 12 Not Taking  . metoprolol (TOPROL-XL) 200 MG 24 hr tablet Take 1 tablet (200 mg total) by mouth daily. Take with or immediately following a meal. (Patient not taking: Reported on 07/19/2015) 30 tablet 11 Not Taking   Fam HX:    Family History  Problem Relation Age of Onset  . Hypertension Mother   . Cancer Brother     Unsure of type.  . Asthma Mother   . Coronary artery disease Mother   . Colon cancer Neg Hx   . Lung cancer Neg Hx   . Prostate cancer Neg Hx   . Stroke      mother  . Heart attack      brother and sister ?age    Social HX:    Social History   Social History  . Marital Status: Widowed    Spouse Name: N/A  . Number of Children: 10  . Years of Education: N/A   Occupational History  . RETIRED    Social History Main Topics  . Smoking status: Former Smoker --  0.33 packs/day for 54 years    Types: Cigarettes    Quit date:  10/11/2014  . Smokeless tobacco: Never Used  . Alcohol Use: No     Comment: 6/22//2016 "h/o etoh abuse; stopped drinking ~ 2012"  . Drug Use: No     Comment: 11/09/2014 "none since 10/11/2014; did smoke marijuana ~ 1 time/month"  . Sexual Activity: Not Currently   Other Topics Concern  . Not on file   Social History Narrative   Works as a Architect part time   Smoker 1 pack last 1.5 days tobacco, smokes marijuana    Denies EtOH x 4 years (on 10/12/12)   9 kids    From [place]   [place] Veteran    12 grade education               ROS:  All 11 ROS were addressed and are negative except what is stated in the HPI  Physical Exam: Blood pressure 131/96, pulse 78, temperature 98.7 F (37.1 C), temperature source Oral, resp. rate 22, height _0  (1.905 m), weight 170 lb 10.2 oz (77.4 kg), SpO2 97 %.    General: Well developed, well nourished, in no acute distress Head: Eyes PERRLA, No xanthomas.   Normal cephalic and atramatic  Lungs:   Diffuse wheezing and crackles at bases Heart:   HRRR S1 S2 Pulses are 2+ & equal.            No carotid bruit.  No abdominal bruits. No femoral bruits.  He has JVD  Abdomen: Bowel sounds are positive, abdomen soft and non-tender without masses \Extremities:   No clubbing, cyanosis or edema.  DP +1 Neuro: Alert and oriented X 3. Psych:  Good affect, responds appropriately    Labs:   Lab Results  Component Value Date   WBC 6.5 07/30/2015   HGB 12.2* 07/30/2015   HCT 38.4* 07/30/2015   MCV 92.1 07/30/2015   PLT 179 07/30/2015    Recent Labs Lab 07/30/15 0159  NA 143  K 5.0  CL 111  CO2 21*  BUN 32*  CREATININE 2.21*  CALCIUM 8.7*  GLUCOSE 164*   No results found for: PTT Lab Results  Component Value Date   INR 1.9 11/09/2014   INR 2.20* 10/27/2014   INR 2.06* 10/26/2014   Lab Results  Component Value Date   CKTOTAL 30* 10/26/2014   CKMB 1.5 07/04/2010    TROPONINI 0.05* 07/30/2015     Lab Results  Component Value Date   CHOL 92 10/12/2014   CHOL 104 07/28/2014   CHOL 113 08/14/2011   Lab Results  Component Value Date   HDL 36* 10/12/2014   HDL 41 07/28/2014   HDL 45 08/14/2011   Lab Results  Component Value Date   LDLCALC 50 10/12/2014   LDLCALC 51 07/28/2014   LDLCALC 59 08/14/2011   Lab Results  Component Value Date   TRIG 31 10/12/2014   TRIG 58 07/28/2014   TRIG 43 08/14/2011   Lab Results  Component Value Date   CHOLHDL 2.6 10/12/2014   CHOLHDL 2.5 07/28/2014   CHOLHDL 2.5 08/14/2011   No results found for: LDLDIRECT    Radiology:  Dg Chest 2 View  07/28/2015  CLINICAL DATA:  Shortness of breath, productive cough, tachycardia, chest pain, weakness, dizziness EXAM: CHEST  2 VIEW COMPARISON:  06/16/2015 FINDINGS: Mild patchy right lower lobe opacity, suspicious for pneumonia, less likely atelectasis. Associated small right pleural effusion. Stable scarring in the left mid lung and left lung base. Volume loss in the left hemithorax. Small left  pleural effusion with apical capping. No pneumothorax. The heart is top-normal in size.  Right subclavian pacemaker. IMPRESSION: Mild patchy right lower lobe opacity, suspicious for pneumonia, less likely atelectasis. Associated small bilateral pleural effusions. Chronic left lung scarring with volume loss. Electronically Signed   By: Julian Hy M.D.   On: 07/28/2015 20:00   Dg Chest Port 1 View  07/29/2015  CLINICAL DATA:  Shortness of breath EXAM: PORTABLE CHEST 1 VIEW COMPARISON:  July 28, 2015 FINDINGS: Layering effusion on the right is identified, unchanged. Underlying opacity is seen. New mild opacity in the right mid lung. Chronic changes again seen in the left lung. Mild increasing density seen in the left mid lung as well. Pleural capping again seen on the left. The cardiomediastinal silhouette is stable. IMPRESSION: Increasing mid lung opacities bilaterally. Stable  layering effusion on the right. Recommend follow-up to resolution. Electronically Signed   By: Dorise Bullion III M.D   On: 07/29/2015 15:03    EKG:  Atrial fibrillation with RVR at 144 bpm with diffuse nonspecific ST abnormality  ASSESSMENT/PLAN:   1.  Chronic atrial fibrillation now with RVR - HR increased most likely secondary to underlying PNA.  His afib has been refractory to Amio (hyperthyroidism) and Multaq and decision was made to pursue rate control.  This patients CHA2DS2-VASc Score and unadjusted Ischemic Stroke Rate (% per year) is equal to 3.2 % stroke rate/year from a score of 2.  Above score calculated as 1 point each if present [CHF, HTN, DM, Vascular=MI/PAD/Aortic Plaque, Age if 65-74, or Male] Above score calculated as 2 points each if present [Age > 75, or Stroke/TIA/TE] - continue Eliquis for chronic anticoagulation. Continue digoxin.  Would hold on BB for now due to active respiratory infection.  Continue Cardizem gtt for rate control until acute respiratory illness improves. Given LV dysfunction would recommend transitioning to BB from CCB at discharge.  Will hold off for now since he has active wheezing.   Stop ASA since he is on a NOAC.  TSH normal 05/2015. 2.  CAP per [facility] on antibx.   3.  Elevated troponin with flat trend with chest pain.  CP is atypical and similar to what he has had when his afib has gone fast in the past.  He describes it as a heaviness that is exacerbated by deep breathing.  He had normal coronary arteries in the past by cath.  Normal nuclear stress test in 2015. Suspect trop elevation due to demand ischemia from rapid HR.  Consider outpt nuclear stress test. 4.  Acute on chronic systolic CHF exacerbated by #1.  Known NIDCM with EF 30-35% by echo 2016.  Weight was up to 165lbs from 148lbs 2 weeks prior to starting Megace but was euvolemic on exam at that time.  Now weight up to 170lbs. With LE edema and BNP elevated at 476.  Continue IV diuretics but change  to 32m IV BID.  Need to follow renal function closely.  Hopefully creatinine will improve with diuresis.  Would start back on BB prior to discharge given LV dysfunction.  No ACE I or ARB due to CKD.  Start Hydralazine 142mTID and Imdur 151maily for afterload and preload reduction and titrate as BP tolerates.    TURSueanne MargaritaD  07/30/2015  1:56 PM

## 2015-07-30 NOTE — Progress Notes (Addendum)
Subjective: Reports dyspnea this morning. He had not received a breathing treatment. Coughing productive of white sputum still.  Objective: Vital signs in last 24 hours: Filed Vitals:   07/30/15 0111 07/30/15 0200 07/30/15 0400 07/30/15 0500  BP:  130/83 105/79 114/89  Pulse:  39 95 72  Temp:   97.8 F (36.6 C)   TempSrc:   Oral   Resp:  22 18 21   Height:      Weight:      SpO2: 97% 97% 97% 97%   Weight change:   Intake/Output Summary (Last 24 hours) at 07/30/15 0755 Last data filed at 07/30/15 0753  Gross per 24 hour  Intake 2240.72 ml  Output   1500 ml  Net 740.72 ml  General: resting in bed Cardiac: irregularly irregular Pulm: expiratory wheezing bilateral lung fields, no crackles heard, moving normal volumes of air Abd: soft, nontender, some distension, BS present Ext: warm and well perfused, no pitting edema appreciated Neuro: alert and oriented X3  Lab Results: Basic Metabolic Panel:  Recent Labs Lab 07/29/15 0659 07/30/15 0159  NA 143 143  K 4.0 5.0  CL 114* 111  CO2 18* 21*  GLUCOSE 143* 164*  BUN 23* 32*  CREATININE 1.60* 2.21*  CALCIUM 8.3* 8.7*   CBC:  Recent Labs Lab 07/29/15 0659 07/30/15 0159  WBC 9.0 6.5  NEUTROABS 6.1  --   HGB 12.2* 12.2*  HCT 37.4* 38.4*  MCV 92.6 92.1  PLT 175 179   Cardiac Enzymes:  Recent Labs Lab 07/29/15 1456 07/29/15 1955 07/30/15 0159  TROPONINI 0.08* 0.06* 0.05*   D-Dimer:  Recent Labs Lab 07/29/15 1955  DDIMER 1.82*    Micro Results: Recent Results (from the past 240 hour(s))  Culture, blood (routine x 2) Call MD if unable to obtain prior to antibiotics being given     Status: None (Preliminary result)   Collection Time: 07/28/15 10:52 PM  Result Value Ref Range Status   Specimen Description BLOOD RIGHT ANTECUBITAL  Final   Special Requests BOTTLES DRAWN AEROBIC AND ANAEROBIC 5CC  Final   Culture NO GROWTH < 24 HOURS  Final   Report Status PENDING  Incomplete  Culture, blood (routine  x 2) Call MD if unable to obtain prior to antibiotics being given     Status: None (Preliminary result)   Collection Time: 07/28/15 10:57 PM  Result Value Ref Range Status   Specimen Description BLOOD RIGHT HAND  Final   Special Requests BOTTLES DRAWN AEROBIC AND ANAEROBIC 5CC  Final   Culture NO GROWTH < 24 HOURS  Final   Report Status PENDING  Incomplete  MRSA PCR Screening     Status: Abnormal   Collection Time: 07/29/15 12:33 AM  Result Value Ref Range Status   MRSA by PCR POSITIVE (A) NEGATIVE Final    Comment:        The GeneXpert MRSA Assay (FDA approved for NASAL specimens only), is one component of a comprehensive MRSA colonization surveillance program. It is not intended to diagnose MRSA infection nor to guide or monitor treatment for MRSA infections. RESULT CALLED TO, READ BACK BY AND VERIFIED WITH: LAWLESS,C RN [id number] 07/29/15 MITCHELL,L   Culture, sputum-assessment     Status: None   Collection Time: 07/29/15 10:36 AM  Result Value Ref Range Status   Specimen Description SPUTUM  Final   Special Requests NONE  Final   Sputum evaluation   Final    THIS SPECIMEN IS ACCEPTABLE. RESPIRATORY CULTURE REPORT TO  FOLLOW.   Report Status 07/29/2015 FINAL  Final   Studies/Results: Dg Chest 2 View  07/28/2015  CLINICAL DATA:  Shortness of breath, productive cough, tachycardia, chest pain, weakness, dizziness EXAM: CHEST  2 VIEW COMPARISON:  06/16/2015 FINDINGS: Mild patchy right lower lobe opacity, suspicious for pneumonia, less likely atelectasis. Associated small right pleural effusion. Stable scarring in the left mid lung and left lung base. Volume loss in the left hemithorax. Small left pleural effusion with apical capping. No pneumothorax. The heart is top-normal in size.  Right subclavian pacemaker. IMPRESSION: Mild patchy right lower lobe opacity, suspicious for pneumonia, less likely atelectasis. Associated small bilateral pleural effusions. Chronic left lung scarring with  volume loss. Electronically Signed   By: Julian Hy M.D.   On: 07/28/2015 20:00   Dg Chest Port 1 View  07/29/2015  CLINICAL DATA:  Shortness of breath EXAM: PORTABLE CHEST 1 VIEW COMPARISON:  July 28, 2015 FINDINGS: Layering effusion on the right is identified, unchanged. Underlying opacity is seen. New mild opacity in the right mid lung. Chronic changes again seen in the left lung. Mild increasing density seen in the left mid lung as well. Pleural capping again seen on the left. The cardiomediastinal silhouette is stable. IMPRESSION: Increasing mid lung opacities bilaterally. Stable layering effusion on the right. Recommend follow-up to resolution. Electronically Signed   By: Dorise Bullion III M.D   On: 07/29/2015 15:03   Medications: I have reviewed the patient's current medications. Scheduled Meds: . apixaban  5 mg Oral BID  . aspirin  81 mg Oral Daily  . azithromycin  250 mg Oral QHS  . buPROPion  75 mg Oral BID  . Chlorhexidine Gluconate Cloth  6 each Topical Q0600  . digoxin  0.0625 mg Oral Daily  . diltiazem  120 mg Oral Daily  . feeding supplement  1 Container Oral TID BM  . gabapentin  300 mg Oral TID  . guaiFENesin  600 mg Oral BID  . ipratropium  0.5 mg Nebulization Q6H  . levalbuterol  0.63 mg Nebulization Q6H  . methylPREDNISolone (SOLU-MEDROL) injection  60 mg Intravenous Q12H  . metoprolol tartrate  50 mg Oral BID  . mirtazapine  15 mg Oral QHS  . mupirocin ointment  1 application Nasal BID  . pantoprazole  80 mg Oral Daily  . rosuvastatin  20 mg Oral q1800   Continuous Infusions:  PRN Meds:.feeding supplement, ipratropium-albuterol, levalbuterol, oxyCODONE-acetaminophen **AND** oxyCODONE, senna-docusate Assessment/Plan: 70 year old man with history of systolic CHF EF [id number], ETOH abuse, COPD, CKD III, CAD presenting with complaints of shortness of breath, cough and chills  Atrial Fibrillation with RVR: Per EP note 11/23/2014, he has failed Multaq, tikosyn and  amiodarone. Related to infection but has also been off of his metoprolol. HR between 39 and 142 yesterday and overnight <123. BP between 105/79 and 148/101.  -Eliquis 5 mg bid -D/c Diltiazem gtt -Home Diltiazem 120 mg po daily -Continue Metoprolol tartrate 50 mg po BID -Continue Digoxin 0.0625 mg po qd -Telemetry -Hx tachy/brady syndrome and followed by Dr. Rayann Heman. Last seen 11/23/2014 and was to follow up in 3 months. Did not follow up. Device interrogation today reveals that his pacemaker is functioning and keeping his heart rate above 50. -Cardiology consulted.  Systolic CHF: EF was Q000111Q on 09/2014 ECHO. Troponins are slightly elevated, likely related to demand ischemia. Received IV Lasix 20mg  once yesterday. 1.3L UOP yesterday. Net +135ml since admission. -Daily weights -Strict I&Os  Community Acquired Pneumonia: CXR with mild patchy  right lower lobe opacity suspicious of PNA as well as evidence of fluid overload. Patient was started on Azithromycin and Rocephin in the ED for CAP.  -Continue oral Azithromcyin 4 more days (total 5 days 3/10>>3/14) -f/u Sputum culture and gram stain -f/u Legionella Ag pending  COPD exacerbation: Patient uses Spiriva daily and normally uses Albuterol bid. Reports increased need for albuterol the past 2 days with some relief. Has expiratory wheezing on exam today. Increased dyspnea and sputum production. -Xopenex/ipatropium q6hr -Change solumedrol to prednisone 40mg  to complete 5 days of steroids.  Chronic Pain:  -Percocet 10-325 mg q4hr prn for chronic hip and low back pain, bilateral hip replacements, and OA of multiple joints.  Dispo: Disposition is deferred at this time, awaiting improvement of current medical problems.  Anticipated discharge in approximately 1-2 day(s).   The patient does have a current PCP (Zada Finders, MD) and does need an [facility] hospital follow-up appointment after discharge.  The patient does not know have transportation  limitations that hinder transportation to clinic appointments.  .Services Needed at time of discharge: Y = Yes, Blank = No PT:   OT:   RN:   Equipment:   Other:     LOS: 2 days   Milagros Loll, MD 07/30/2015, 7:55 AM    Date: 07/30/2015  Patient name: RAFID WATT  Medical record number: [id number]  Date of birth: 1946/03/04   This patient's plan of care was discussed with the house staff. Please see their note for complete details. I concur with their findings.  Patient examined with Dr. Randell Patient and he still has sig expiratory wheezes and crackles at bases #1 AF w RVR and tachybradycardia syndrome. Currently his rate appears better controlled on long-acting diltiazem and metoprolol along with digoxin. Awaiting formal consultation cardiology and wonder if he might need electrophysiological procedure to help with his problem.  COPD: Corticosteroids seem to have helped some along with albuterol though course we worry that the latter worsening his atrial fibrillation.  Systolic heart failure with pulmonary edema and  demand ischemia. We gave him Lasix yesterday or following his output closely.  Possible CAP: to finish 5 days of macrolide therapy  Truman Hayward, MD 07/30/2015, 1:17 PM

## 2015-07-31 ENCOUNTER — Other Ambulatory Visit: Payer: Self-pay

## 2015-07-31 DIAGNOSIS — M25559 Pain in unspecified hip: Secondary | ICD-10-CM

## 2015-07-31 DIAGNOSIS — J449 Chronic obstructive pulmonary disease, unspecified: Secondary | ICD-10-CM

## 2015-07-31 DIAGNOSIS — Z96643 Presence of artificial hip joint, bilateral: Secondary | ICD-10-CM

## 2015-07-31 DIAGNOSIS — M150 Primary generalized (osteo)arthritis: Secondary | ICD-10-CM

## 2015-07-31 DIAGNOSIS — J189 Pneumonia, unspecified organism: Secondary | ICD-10-CM

## 2015-07-31 DIAGNOSIS — I5023 Acute on chronic systolic (congestive) heart failure: Secondary | ICD-10-CM

## 2015-07-31 DIAGNOSIS — G8929 Other chronic pain: Secondary | ICD-10-CM

## 2015-07-31 DIAGNOSIS — Z0389 Encounter for observation for other suspected diseases and conditions ruled out: Secondary | ICD-10-CM

## 2015-07-31 DIAGNOSIS — IMO0001 Reserved for inherently not codable concepts without codable children: Secondary | ICD-10-CM

## 2015-07-31 DIAGNOSIS — M545 Low back pain: Secondary | ICD-10-CM

## 2015-07-31 LAB — LEGIONELLA ANTIGEN, URINE

## 2015-07-31 MED ORDER — SENNOSIDES-DOCUSATE SODIUM 8.6-50 MG PO TABS
1.0000 | ORAL_TABLET | Freq: Every day | ORAL | Status: DC
  Administered 2015-07-31 – 2015-08-03 (×4): 1 via ORAL
  Filled 2015-07-31 (×4): qty 1

## 2015-07-31 MED ORDER — IPRATROPIUM-ALBUTEROL 0.5-2.5 (3) MG/3ML IN SOLN
3.0000 mL | RESPIRATORY_TRACT | Status: DC
  Administered 2015-07-31 – 2015-08-02 (×10): 3 mL via RESPIRATORY_TRACT
  Filled 2015-07-31 (×14): qty 3

## 2015-07-31 NOTE — Care Management Note (Signed)
Case Management Note  Patient Details  Name: [name] MRN: [id number] Date of Birth: 08/12/1945  Subjective/Objective:    Date: 07/31/15 Spoke with patient at the bedside.  Introduced self as Tourist information centre manager and explained role in discharge planning and how to be reached.  Verified patient lives in town, with son who is a Administrator, so he is basically alone most of the time.  Has a cane and a rolling walker and home oxygen through [facility], ( 3 liters at home).  Expressed potential need for no other DME.  Verified patient anticipates to go home with family,  at time of discharge and will have  part-time supervision by family(his daughter comes by to check on him also but has a small child) at this time to best of their knowledge. Patient denied needing help with their medication.  Patient drives  to MD appointments.  Verified patient has PCP Zada Finders.   Plan: CM will continue to follow for discharge planning and [facility] resources.                 Action/Plan:   Expected Discharge Date:                  Expected Discharge Plan:  [place]  In-House Referral:     Discharge planning Services  CM Consult  Post Acute Care Choice:    Choice offered to:     DME Arranged:    DME Agency:     HH Arranged:    [place] Agency:     Status of Service:  In process, will continue to follow  Medicare Important Message Given:    Date Medicare IM Given:    Medicare IM give by:    Date Additional Medicare IM Given:    Additional Medicare Important Message give by:     If discussed at [place] of Stay Meetings, dates discussed:    Additional Comments:  Zenon Mayo, RN 07/31/2015, 1:02 PM

## 2015-07-31 NOTE — Patient Outreach (Signed)
[facility]) Care Management  07/31/2015  [name] Nov 26, 1945 [id number]   Received voice mail message from patient stating he was in the hospital.  Patient requested return call.  RNCM attempted return call to phone number left, [phone].  Unable to reach patient. Phone only rang.   PLAN: RNCM will attempt 2nd telephone call to patient within 3 business days.   [name] RN,BSN,CCM [facility] Telephonic  [phone]

## 2015-07-31 NOTE — Progress Notes (Signed)
Internal Medicine Attending  Date: 07/31/2015  Patient name: [name] Medical record number: [id number] Date of birth: Apr 01, 1946 Age: 70 y.o. Gender: male  I saw and evaluated the patient. I reviewed the resident's note by Dr. [name] and I agree with the resident's findings and plans as documented in his progress note.  When seen on rounds this morning [name] noted some improvement in his dyspnea. At this point it seems like he developed a community-acquired pneumonia that resulted in atrial fibrillation with a rapid ventricular rate and an exacerbation of his heart failure. His pneumonia is responding to the azithromycin and his rate has responded to the digoxin and Cardizem. He was gently diuresed but has had a bump in his creatinine. I agree with continuing the current management and holding his diuretics and reassessing his kidney function in the morning.

## 2015-07-31 NOTE — Progress Notes (Signed)
Subjective:  Sitting up in bed- looks pretty comfortable on O2  Objective:  Vital Signs in the last 24 hours: Temp:  [98.2 F (36.8 C)-98.4 F (36.9 C)] 98.2 F (36.8 C) (03/13 1201) Pulse Rate:  [48-133] 74 (03/13 1200) Resp:  [14-24] 15 (03/13 1200) BP: (111-171)/(76-119) 140/88 mmHg (03/13 1200) SpO2:  [96 %-100 %] 99 % (03/13 1200)  Intake/Output from previous day:  Intake/Output Summary (Last 24 hours) at 07/31/15 1325 Last data filed at 07/31/15 1300  Gross per 24 hour  Intake 1649.88 ml  Output   3000 ml  Net -1350.12 ml    Physical Exam: General appearance: alert, cooperative and no distress Lungs: diffuse wheezing and rhonchi Heart: irregularly irregular rhythm Extremities: no LE edema   Rate: 90-120  Rhythm: atrial fibrillation  Lab Results:  Recent Labs  07/29/15 0659 07/30/15 0159  WBC 9.0 6.5  HGB 12.2* 12.2*  PLT 175 179    Recent Labs  07/29/15 0659 07/30/15 0159  NA 143 143  K 4.0 5.0  CL 114* 111  CO2 18* 21*  GLUCOSE 143* 164*  BUN 23* 32*  CREATININE 1.60* 2.21*    Recent Labs  07/29/15 1955 07/30/15 0159  TROPONINI 0.06* 0.05*   No results for input(s): INR in the last 72 hours.  Scheduled Meds: . antiseptic oral rinse  7 mL Mouth Rinse BID  . apixaban  5 mg Oral BID  . azithromycin  250 mg Oral QHS  . buPROPion  75 mg Oral BID  . Chlorhexidine Gluconate Cloth  6 each Topical Q0600  . digoxin  0.0625 mg Oral Daily  . feeding supplement  1 Container Oral TID BM  . gabapentin  300 mg Oral TID  . guaiFENesin  600 mg Oral BID  . hydrALAZINE  10 mg Oral 3 times per day  . ipratropium-albuterol  3 mL Nebulization Q4H  . isosorbide mononitrate  15 mg Oral Daily  . mirtazapine  15 mg Oral QHS  . mupirocin ointment  1 application Nasal BID  . pantoprazole  80 mg Oral Daily  . predniSONE  40 mg Oral Q breakfast  . rosuvastatin  20 mg Oral q1800  . senna-docusate  1 tablet Oral Daily   Continuous Infusions: .  diltiazem (CARDIZEM) infusion 10 mg/hr (07/31/15 1300)   PRN Meds:.feeding supplement, levalbuterol, oxyCODONE-acetaminophen **AND** oxyCODONE   Imaging: Imaging results have been reviewed   Assessment/Plan:  70 year old male with a past medical history of chronic systolic CHF with nonischemic DCM -EF 30-35% with normal coronary arteries by cath 2003 and low risk Myoview Feb 2015.  Echo May 2016- with EF 30-35%. There is also a history of ETOH abuse, COPD, CKD III, and CAF with SSS, s/p St Jude PTVDP '04-gen change 2010.  who presented with CAP with AF with RVR, acute on chronic CHF, acute on chronic renal insufficiency, and an elevated Troponin (felt to be demand ischemia).   Principal Problem:   CAP (community acquired pneumonia) Active Problems:   Atrial fibrillation with RVR (HCC)   Acute systolic congestive heart failure (HCC)   Demand ischemia (HCC)   Essential hypertension   Cardiac pacemaker in situ   COPD (chronic obstructive pulmonary disease) (HCC)   Long-term (current) use of anticoagulants   Chronic kidney disease (CKD), stage III (moderate)   Chronic combined systolic and diastolic congestive heart failure (HCC)   Non-ischemic cardiomyopathy (HCC)   Longstanding persistent atrial fibrillation ([place])   G E R D  Depression   Protein-calorie malnutrition, severe (HCC)   Chronic pain   Normal coronary arteries-2003   H/O Arterial embolism of leg    PLAN:  Continue IV Diltiazem and low dose Lanoxin for rate control. No beta blocker secondary to active wheezing.  He is on Eliquis.  It's not clear how "wet" he is. Significant jump in SCr after just two doses of Lasix. MD to see.   Kerin Ransom PA-C 07/31/2015, 1:25 PM [phone]  Attending Note:   The patient was seen and examined.  Agree with assessment and plan as noted above.  Changes made to the above note as needed.  He has significant rhonchi and wheezes bilaterlly I suspect this is a major contributor to his  tachycardia Continue Dilt and very low digoxin   Agree with avoiding beta blockers for now     Thayer Headings, Brooke Bonito., MD, [facility] 07/31/2015, 2:18 PM [address],  [place] Pager [phone]

## 2015-07-31 NOTE — Progress Notes (Signed)
Pt very upset that someone keeps turning his oxygen flow down to .05 lpm he states he always wears 3 lpm at home and wants it to stay that way while in the hospital.  I advised pt i would pass along message to RN and other staff.

## 2015-07-31 NOTE — Progress Notes (Signed)
Pt states he wears 3L at home. Pt was found on 1L [place]. I turned pt back up to 3L [place].

## 2015-07-31 NOTE — Care Management Important Message (Signed)
Important Message  Patient Details  Name: [name] MRN: [id number] Date of Birth: 1945-07-31   Medicare Important Message Given:  Yes    Zenon Mayo, RN 07/31/2015, 1:27 PMImportant Message  Patient Details  Name: [name] MRN: [id number] Date of Birth: [birth year]   Medicare Important Message Given:  Yes    Zenon Mayo, RN 07/31/2015, 1:27 PM

## 2015-07-31 NOTE — Progress Notes (Signed)
Subjective: Patient states that he is starting to feel better. He denies any chest pain, fevers, or chills. He has continued cough which is non-productive. He still has difficulty with ambulating short distances and is concerned he may fall if trying to go to the bathroom.   Objective: Vital signs in last 24 hours: Filed Vitals:   07/31/15 0500 07/31/15 0600 07/31/15 0800 07/31/15 0809  BP: 137/85 114/80  111/82  Pulse: 88 64 67 67  Temp:    98.4 F (36.9 C)  TempSrc:    Oral  Resp: 15 16 15 19   Height:      Weight:      SpO2: 98% 99% 98% 98%   Weight change:   Intake/Output Summary (Last 24 hours) at 07/31/15 1109 Last data filed at 07/31/15 0350  Gross per 24 hour  Intake 1462.88 ml  Output   2825 ml  Net -1362.12 ml   General: resting in bed, no acute distress Cardiac: irregularly irregular, rate 90s Pulm: coarse wheezing and rhonchi bilateral lung fields, most prominent in left lung base Abd: soft, nontender, BS present Ext: warm and well perfused, no pitting edema appreciated   Assessment/Plan: Principal Problem:   CAP (community acquired pneumonia) Active Problems:   Essential hypertension   BRADYCARDIA-TACHYCARDIA SYNDROME   COPD (chronic obstructive pulmonary disease) (HCC)   G E R D   Thrombosis, portal vein   Depression   Chronic kidney disease (CKD), stage III (moderate)   Chronic combined systolic and diastolic congestive heart failure (HCC)   Longstanding persistent atrial fibrillation (HCC)   Protein-calorie malnutrition, severe (HCC)   Chronic pain   Atrial fibrillation with RVR (HCC)   Acute systolic congestive heart failure (HCC)  A. Fib with RVR: Likely related to CAP. Rate controlled on Diltiazem gtt. Cardiology following and recommending holding BB for now during active resp infection, to resume on discharge in place of CCB. -Cardiology following -Eliquis 5 mg bid -Continue Diltiazem ggt -Hold Metoprolol 50 mg po BID -Continue Digoxin  0.0625 mg po qd -Telemetry  Systolic CHF: EF was Q000111Q on 5//2016 ECHO. Troponins are slightly elevated, likely related to demand ischemia. IV Lasix increased to 40 mg BID with bump in SCr from 1.60 to 2.21. UOP of 3.2 L, net down 1150 mLs.  -Hold IV Lasix 40 mg BID for now -Daily weights -Strict I&Os -Hold Metoprolol 50 mg BID -> resume on discharge -Hydralazine 10 mg TID -Imdur 15 mg qd  Community Acquired Pneumonia: CXR with mild patchy right lower lobe opacity suspicious of PNA as well as evidence of fluid overload. Patient was started on Azithromycin and Rocephin in the ED for CAP. Continuing treatment of CAP with 5 total days Azithromycin.  -Continue oral Azithromcyin (total 5 days 3/10>>3/14) -f/u Sputum culture >> few staph aureus, positive MRSA Nares, possibly from colonization. Patient clinically improving, if worsened will need to consider escalation of abx   COPD: Patient uses Spiriva daily and normally uses Albuterol bid. Reports increased need for albuterol with some relief. Has continued wheezing and rhonchi on exam today. -Duonebs q4hr -Xopenex q4h prn -Mucinex 600 mg BID -Prednisone 40 mg to complete 5 day course  Chronic Pain:  -Percocet 10-325 mg q4hr prn for chronic hip and low back pain, bilateral hip replacements, and OA of multiple joints.   Dispo: Disposition is deferred at this time, awaiting improvement of current medical problems.  Anticipated discharge in approximately 1-3 day(s).        LOS: 3 days  Zada Finders, MD 07/31/2015, 11:09 AM

## 2015-08-01 ENCOUNTER — Other Ambulatory Visit: Payer: Self-pay

## 2015-08-01 ENCOUNTER — Encounter: Payer: Self-pay | Admitting: *Deleted

## 2015-08-01 ENCOUNTER — Other Ambulatory Visit: Payer: Self-pay | Admitting: *Deleted

## 2015-08-01 DIAGNOSIS — Z9981 Dependence on supplemental oxygen: Secondary | ICD-10-CM

## 2015-08-01 DIAGNOSIS — J441 Chronic obstructive pulmonary disease with (acute) exacerbation: Principal | ICD-10-CM

## 2015-08-01 LAB — CULTURE, RESPIRATORY

## 2015-08-01 LAB — BASIC METABOLIC PANEL
ANION GAP: 10 (ref 5–15)
BUN: 51 mg/dL — AB (ref 6–20)
CHLORIDE: 113 mmol/L — AB (ref 101–111)
CO2: 20 mmol/L — AB (ref 22–32)
Calcium: 8.6 mg/dL — ABNORMAL LOW (ref 8.9–10.3)
Creatinine, Ser: 2.33 mg/dL — ABNORMAL HIGH (ref 0.61–1.24)
GFR calc Af Amer: 31 mL/min — ABNORMAL LOW (ref >60)
GFR calc non Af Amer: 26 mL/min — ABNORMAL LOW (ref >60)
GLUCOSE: 123 mg/dL — AB (ref 65–99)
POTASSIUM: 5.2 mmol/L — AB (ref 3.5–5.1)
Sodium: 143 mmol/L (ref 135–145)

## 2015-08-01 LAB — CULTURE, RESPIRATORY W GRAM STAIN

## 2015-08-01 LAB — CBC
HEMATOCRIT: 39.5 % (ref 39.0–52.0)
HEMOGLOBIN: 12.3 g/dL — AB (ref 13.0–17.0)
MCH: 29.5 pg (ref 26.0–34.0)
MCHC: 31.1 g/dL (ref 30.0–36.0)
MCV: 94.7 fL (ref 78.0–100.0)
Platelets: 223 10*3/uL (ref 150–400)
RBC: 4.17 MIL/uL — AB (ref 4.22–5.81)
RDW: 16 % — ABNORMAL HIGH (ref 11.5–15.5)
WBC: 13 10*3/uL — AB (ref 4.0–10.5)

## 2015-08-01 MED ORDER — METHYLPREDNISOLONE SODIUM SUCC 40 MG IJ SOLR
40.0000 mg | Freq: Once | INTRAMUSCULAR | Status: AC
  Administered 2015-08-01: 40 mg via INTRAVENOUS
  Filled 2015-08-01: qty 1

## 2015-08-01 MED ORDER — BUDESONIDE 0.25 MG/2ML IN SUSP
0.2500 mg | Freq: Two times a day (BID) | RESPIRATORY_TRACT | Status: DC
  Administered 2015-08-01 – 2015-08-08 (×14): 0.25 mg via RESPIRATORY_TRACT
  Filled 2015-08-01 (×14): qty 2

## 2015-08-01 MED ORDER — POLYETHYLENE GLYCOL 3350 17 G PO PACK
17.0000 g | PACK | Freq: Every day | ORAL | Status: DC
  Administered 2015-08-01 – 2015-08-03 (×3): 17 g via ORAL
  Filled 2015-08-01 (×3): qty 1

## 2015-08-01 NOTE — Progress Notes (Addendum)
Subjective: Patient angry that his oxygen is decreased from 3L which he says he uses at home. States that he does not care what his O2 numbers are, but he knows how much oxygen his body needs. Heart rate is up to 150s during rounds on 15 mg/hr Diltiazem gtt. Prior to rounds, patient was calm and coughed up beige sputum and used flutter valve which he says was helpful.   Objective: Vital signs in last 24 hours: Filed Vitals:   08/01/15 0900 08/01/15 0938 08/01/15 1000 08/01/15 1117  BP: 139/112  141/113   Pulse: 99 138 144   Temp:    98.6 F (37 C)  TempSrc:      Resp: 17  19   Height:      Weight:      SpO2: 100%  97%    Weight change:   Intake/Output Summary (Last 24 hours) at 08/01/15 1245 Last data filed at 08/01/15 1000  Gross per 24 hour  Intake 793.33 ml  Output   1775 ml  Net -981.67 ml   General: resting in bed, angry Cardiac: irregularly irregular, rate up to 150 during rounds Pulm: coarse wheezing and rhonchi bilateral lung fields Abd: soft, nontender, BS present Ext: warm and well perfused, no pitting edema appreciated   Assessment/Plan: Principal Problem:   CAP (community acquired pneumonia) Active Problems:   Essential hypertension   Cardiac pacemaker in situ   COPD (chronic obstructive pulmonary disease) (HCC)   G E R D   Long-term (current) use of anticoagulants   H/O Arterial embolism of leg    Depression   Chronic kidney disease (CKD), stage III (moderate)   Chronic combined systolic and diastolic congestive heart failure (HCC)   Non-ischemic cardiomyopathy (HCC)   Longstanding persistent atrial fibrillation (HCC)   Protein-calorie malnutrition, severe (HCC)   Chronic pain   Acute-on-chronic kidney injury (HCC)   Atrial fibrillation with RVR (HCC)   Acute systolic congestive heart failure (HCC)   Normal coronary arteries-2003   Demand ischemia ([place])  A. Fib with RVR: CHADSVASc of 6 with 9.8% yearly risk of stroke. Likely exacerbated by  respiratory condition. Rate elevated on Diltiazem gtt up to 150 during rounds. Cardiology following and recommending holding BB for now during active resp infection, to resume on discharge in place of CCB. -Cardiology following -Eliquis 5 mg bid -Continue Diltiazem ggt, increased by Cards -Hold Metoprolol 50 mg po BID -Continue Digoxin 0.0625 mg po qd -Telemetry   Community Acquired Pneumonia: CXR with mild patchy right lower lobe opacity suspicious of PNA. Patient was started on Azithromycin and Rocephin in the ED for CAP. Continuing treatment of CAP with 5 total days Azithromycin. Has continued wheezing and rhonchi on exam today.  -Continue oral Azithromcyin (total 5 days 3/10>>3/14), last dose tonight -f/u Sputum culture >> few staph aureus, positive MRSA Nares, possibly from colonization. If patient not improving or worsens will need to consider escalation of abx  COPD: Patient uses Spiriva daily and normally uses Albuterol bid. Reports increased need for albuterol with some relief prior to admission. Has continued wheezing and rhonchi on exam today. Patient reports using 3L O2 [place] at home and is angry his O2 is decreased from this during hospital stay. States that he knows his body and does not care what his oxygen numbers are. -Duonebs q4hr -Xopenex q4h prn -Mucinex 600 mg BID -Prednisone 40 mg to complete 5 day course (3/11>>3/15) -IV Solumedrol 40 mg today -O2 assessment prior to discharge -Monitor  respiratory status   Systolic CHF: EF was 99991111 on 5//2016 ECHO. SCr from 1.60>>2.21>>2.33. UOP of 2.2 L, net down ~2500 mLs.  -Hold IV Lasix 40 mg BID -Daily weights -Strict I&Os -Hold Metoprolol 50 mg BID -> resume on discharge -Hydralazine 10 mg TID -Imdur 15 mg qd   Chronic Pain:  -Percocet 10-325 mg q4hr prn for chronic hip and low back pain, bilateral hip replacements, and OA of multiple joints.   Dispo: Disposition is deferred at this time, awaiting improvement of  current medical problems.  Anticipated discharge in approximately 1-3 day(s).        LOS: 4 days   Zada Finders, MD 08/01/2015, 12:45 PM

## 2015-08-01 NOTE — Progress Notes (Signed)
Internal Medicine Attending  Date: 08/01/2015  Patient name: [name] Medical record number: [id number] Date of birth: 01/04/1946 Age: 70 y.o. Gender: male  I saw and evaluated the patient. I reviewed the resident's note by Dr. [name] and I agree with the resident's findings and plans as documented in his progress note.  Mr. Weisheit was very angry this morning because his oxygen was turned down below 3 L. He states he knows what his body needs and he insists upon 3 L of oxygen by nasal cannula per minute. He was unwilling to discuss any other issues when seen on rounds because of his significant anger over the supplemental oxygen. We asked if he would be willing to discuss his symptoms at another time and he said he would.  Physical exam reveals continued expiratory wheezes bilaterally and an irregularly irregular tachycardia. His community-acquired pneumonia is currently being treated with azithromycin but the course is almost complete. His COPD exacerbation is also being treated with prednisone and this course is almost complete as well. I suspect the wheezing on exam may be related to the atrial fibrillation with rapid ventricular rate although the A. fib with RVR may be secondary to his obstructive lung disease as well. We may need to extend the prednisone if he remains obstructed on exam tomorrow. I agree with cardiology about increasing the diltiazem drip from 15 mg to 20 mg. With regards to the coding inquiry I'm unable to comment on whether or not he has chronic hypoxic ventilatory failure, but likely has acute hypoxia related to his current cardiopulmonary process.
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
## 2015-08-01 NOTE — Clinical Documentation Improvement (Signed)
Internal Medicine  (Query responses must be documented in the current medical record, not on the CDI BPA form.)  Possible Clinical Conditions:  - Chronic Hypoxic Respiratory Failure, requiring home O2, including any associated cause(s) and/or condition(s)   - Other condition  - Unable to clinically determine  Clinical Information/Indicators: "He indicated that he is typically on 3L O2 at home "as needed." He was noted to be satting 95% on 3LNC." documented by Dr. Liberty Handy 07/29/15 at 9:34 am  "Pt states he wears 3L at home. Pt was found on 1L [place]. I turned pt back up to 3L [place]." documented by Shea Stakes RRT 07/31/15 at 11:54 am "Patient angry that his oxygen is decreased from 3L which he says he uses at home." documented by Dr. [name] 08/01/15 at 12:46 am     Please exercise your independent, professional judgment when responding. A specific answer is not anticipated or expected.   Thank You, Erling Conte  RN BSN CCDS [phone] Health Information Management [place]

## 2015-08-01 NOTE — Patient Outreach (Signed)
[facility]) Care Management  08/01/2015  LACHLANN INSELMAN [birth year] [id number]  Return call to patient. HIPAA verified with patient. Patient states he was admitted to [facility] on 07/31/15.  Patient states he needs some help.  Patient states, " I keep trying to tell these people, the doctor and the nurses that I'm having trouble breathing."  "I wear my oxygen at home on 3L and they turning down to 1L."  RNCM informed patient that she would have one of the [facility] care management hospital liaisons follow up with him on today.   RNCM contacted the [facility] hospital  nurses station on patients unit.  Spoke with [place] who states she was aware of patients concern regarding his oxygen.  States the respiratory therapist and the doctor has seen the patient this morning regarding patients concern.   ASSESSMENT: Per EPIC chart patient admitted to [facility] on 07/31/15 for Atrial fibrillation, pneumonia, and congestive heart failure.  PLAN: RNCM notified Marthenia Rolling, [facility] care management hospital liaison of patients hospital admission and patients concern regarding his oxygen.  Requested that a [facility] hospital liaison follow up with patient on today. RNCM notified Josepha Pigg, [facility] case management assistant of patients admission.   [name] RN,BSN,CCM [facility] Telephonic  [phone]

## 2015-08-01 NOTE — Progress Notes (Signed)
Subjective:  Angry- cursing- wants his O2 adjusted to where he wants it  Objective:  Vital Signs in the last 24 hours: Temp:  [98.1 F (36.7 C)-98.7 F (37.1 C)] 98.7 F (37.1 C) (03/14 0700) Pulse Rate:  [45-127] 78 (03/14 0600) Resp:  [13-20] 19 (03/14 0500) BP: (117-146)/(70-110) 146/78 mmHg (03/14 0600) SpO2:  [94 %-100 %] 100 % (03/14 0800) Weight:  [136 lb 3.9 oz (61.8 kg)] 136 lb 3.9 oz (61.8 kg) (03/14 0500)  Intake/Output from previous day:  Intake/Output Summary (Last 24 hours) at 08/01/15 0836 Last data filed at 08/01/15 0700  Gross per 24 hour  Intake 593.33 ml  Output   1575 ml  Net -981.67 ml    Physical Exam: Pt upset unable to examine at this time   Rate: 90-120  Rhythm: atrial fibrillation  Lab Results:  Recent Labs  07/30/15 0159 08/01/15 0408  WBC 6.5 13.0*  HGB 12.2* 12.3*  PLT 179 223    Recent Labs  07/30/15 0159 08/01/15 0408  NA 143 143  K 5.0 5.2*  CL 111 113*  CO2 21* 20*  GLUCOSE 164* 123*  BUN 32* 51*  CREATININE 2.21* 2.33*    Recent Labs  07/29/15 1955 07/30/15 0159  TROPONINI 0.06* 0.05*   No results for input(s): INR in the last 72 hours.  Scheduled Meds: . antiseptic oral rinse  7 mL Mouth Rinse BID  . apixaban  5 mg Oral BID  . azithromycin  250 mg Oral QHS  . buPROPion  75 mg Oral BID  . Chlorhexidine Gluconate Cloth  6 each Topical Q0600  . digoxin  0.0625 mg Oral Daily  . feeding supplement  1 Container Oral TID BM  . gabapentin  300 mg Oral TID  . guaiFENesin  600 mg Oral BID  . hydrALAZINE  10 mg Oral 3 times per day  . ipratropium-albuterol  3 mL Nebulization Q4H  . isosorbide mononitrate  15 mg Oral Daily  . mirtazapine  15 mg Oral QHS  . mupirocin ointment  1 application Nasal BID  . pantoprazole  80 mg Oral Daily  . predniSONE  40 mg Oral Q breakfast  . rosuvastatin  20 mg Oral q1800  . senna-docusate  1 tablet Oral Daily   Continuous Infusions: . diltiazem (CARDIZEM) infusion 15  mg/hr (08/01/15 0742)   PRN Meds:.feeding supplement, levalbuterol, oxyCODONE-acetaminophen **AND** oxyCODONE   Imaging: Imaging results have been reviewed   Assessment/Plan:  70 year old male with a past medical history of chronic systolic CHF with nonischemic DCM -EF 30-35%. He had normal coronary arteries by cath in 2003 and low risk Myoview Feb 2015.  Echo May 2016-  EF 30-35%. There is also a history of ETOH abuse, COPD, CKD III, and CAF with SSS. He is s/p St Jude PTVDP '04-gen change 2010.  He presented with CAP with AF with RVR, acute on chronic CHF, acute on chronic renal insufficiency, and an elevated Troponin (felt to be demand ischemia).   Principal Problem:   CAP (community acquired pneumonia) Active Problems:   Acute-on-chronic kidney injury ([place])   Atrial fibrillation with RVR (HCC)   Acute systolic congestive heart failure (HCC)   Demand ischemia (HCC)   Essential hypertension   Cardiac pacemaker in situ   COPD (chronic obstructive pulmonary disease) (HCC)   Long-term (current) use of anticoagulants   Chronic kidney disease (CKD), stage III (moderate)   Chronic combined systolic and diastolic congestive heart failure ([place])  Non-ischemic cardiomyopathy ([place])   Longstanding persistent atrial fibrillation (HCC)   G E R D   H/O Arterial embolism of leg    Depression   Protein-calorie malnutrition, severe (HCC)   Chronic pain   Normal coronary arteries-2003   PLAN:  Continue IV Diltiazem and low dose Lanoxin for rate control. No beta blocker secondary to active wheezing.  He is on Eliquis. Significant jump in SCr after just two doses of Lasix. Diuretics on hold.MD to see.   Kerin Ransom PA-C 08/01/2015, 8:36 AM [phone]  Attending Note:   The patient was seen and examined.  Agree with assessment and plan as noted above.  Changes made to the above note as needed.  Has elevate HR  Is allergic to amiodarone - caused thyroiditis Will increase dilt  He has  significant wheezing on exam - I suspect this is a major contributor to his fast ventricular rate.    Thayer Headings, Brooke Bonito., MD, [facility] 08/01/2015, 9:59 AM [address],  [place] Pager [phone]

## 2015-08-02 ENCOUNTER — Inpatient Hospital Stay (HOSPITAL_COMMUNITY): Payer: Commercial Managed Care - HMO

## 2015-08-02 LAB — CULTURE, BLOOD (ROUTINE X 2)
Culture: NO GROWTH
Culture: NO GROWTH

## 2015-08-02 LAB — BASIC METABOLIC PANEL
ANION GAP: 11 (ref 5–15)
BUN: 56 mg/dL — ABNORMAL HIGH (ref 6–20)
CO2: 20 mmol/L — AB (ref 22–32)
Calcium: 9.4 mg/dL (ref 8.9–10.3)
Chloride: 112 mmol/L — ABNORMAL HIGH (ref 101–111)
Creatinine, Ser: 2.37 mg/dL — ABNORMAL HIGH (ref 0.61–1.24)
GFR, EST AFRICAN AMERICAN: 30 mL/min — AB (ref >60)
GFR, EST NON AFRICAN AMERICAN: 26 mL/min — AB (ref >60)
GLUCOSE: 147 mg/dL — AB (ref 65–99)
POTASSIUM: 5.6 mmol/L — AB (ref 3.5–5.1)
Sodium: 143 mmol/L (ref 135–145)

## 2015-08-02 MED ORDER — METOPROLOL TARTRATE 25 MG PO TABS
25.0000 mg | ORAL_TABLET | Freq: Two times a day (BID) | ORAL | Status: DC
  Administered 2015-08-02 (×2): 25 mg via ORAL
  Filled 2015-08-02 (×2): qty 1

## 2015-08-02 MED ORDER — IPRATROPIUM-ALBUTEROL 0.5-2.5 (3) MG/3ML IN SOLN
3.0000 mL | RESPIRATORY_TRACT | Status: DC
  Administered 2015-08-03 (×4): 3 mL via RESPIRATORY_TRACT
  Filled 2015-08-02 (×5): qty 3

## 2015-08-02 MED ORDER — OFF THE BEAT BOOK
Freq: Once | Status: AC
  Administered 2015-08-02: 1
  Filled 2015-08-02: qty 1

## 2015-08-02 MED ORDER — PREDNISONE 20 MG PO TABS
40.0000 mg | ORAL_TABLET | Freq: Every day | ORAL | Status: DC
  Administered 2015-08-03: 40 mg via ORAL
  Filled 2015-08-02 (×2): qty 2

## 2015-08-02 MED ORDER — IPRATROPIUM-ALBUTEROL 0.5-2.5 (3) MG/3ML IN SOLN: 3.0000 mL | Freq: Four times a day (QID) | RESPIRATORY_TRACT | Status: DC

## 2015-08-02 NOTE — Progress Notes (Signed)
Subjective:  Pleasant this am- he thinks his SOB is improved  Objective:  Vital Signs in the last 24 hours: Temp:  [98 F (36.7 C)-98.6 F (37 C)] 98.2 F (36.8 C) (03/15 0748) Pulse Rate:  [72-139] 77 (03/15 0748) Resp:  [13-23] 15 (03/15 0748) BP: (130-163)/(84-116) 131/95 mmHg (03/15 0748) SpO2:  [96 %-100 %] 99 % (03/15 0748) Weight:  [166 lb 0.1 oz (75.3 kg)] 166 lb 0.1 oz (75.3 kg) (03/15 0500)  Intake/Output from previous day:  Intake/Output Summary (Last 24 hours) at 08/02/15 1015 Last data filed at 08/02/15 0800  Gross per 24 hour  Intake 1397.09 ml  Output   1425 ml  Net -27.91 ml    Physical Exam: General appearance: alert, cooperative, no distress and on O2 Lungs: exp wheezing, scattered rhonchi Heart: irregularly irregular rhythm Skin: warm and dry Neurologic: Grossly normal   Rate: 115  Rhythm: atrial fibrillation  Lab Results:  Recent Labs  08/01/15 0408  WBC 13.0*  HGB 12.3*  PLT 223    Recent Labs  08/01/15 0408  NA 143  K 5.2*  CL 113*  CO2 20*  GLUCOSE 123*  BUN 51*  CREATININE 2.33*   No results for input(s): TROPONINI in the last 72 hours.  Invalid input(s): CK, MB No results for input(s): INR in the last 72 hours.  Scheduled Meds: . antiseptic oral rinse  7 mL Mouth Rinse BID  . apixaban  5 mg Oral BID  . budesonide (PULMICORT) nebulizer solution  0.25 mg Nebulization BID  . buPROPion  75 mg Oral BID  . Chlorhexidine Gluconate Cloth  6 each Topical Q0600  . digoxin  0.0625 mg Oral Daily  . feeding supplement  1 Container Oral TID BM  . gabapentin  300 mg Oral TID  . guaiFENesin  600 mg Oral BID  . hydrALAZINE  10 mg Oral 3 times per day  . ipratropium-albuterol  3 mL Nebulization Q4H  . isosorbide mononitrate  15 mg Oral Daily  . metoprolol tartrate  25 mg Oral BID  . mirtazapine  15 mg Oral QHS  . mupirocin ointment  1 application Nasal BID  . pantoprazole  80 mg Oral Daily  . polyethylene glycol  17 g Oral  Daily  . [START ON 08/03/2015] predniSONE  40 mg Oral Q breakfast  . rosuvastatin  20 mg Oral q1800  . senna-docusate  1 tablet Oral Daily   Continuous Infusions: . diltiazem (CARDIZEM) infusion 20 mg/hr (08/02/15 0753)   PRN Meds:.feeding supplement, levalbuterol, oxyCODONE-acetaminophen **AND** oxyCODONE   Imaging: Imaging results have been reviewed  Cardiac Studies:  Echo May 2016 Study Conclusions  - Left ventricle: The cavity size was normal. Systolic function was  moderately to severely reduced. The estimated ejection fraction  was in the range of 30% to 35%. Diffuse hypokinesis. - Aortic valve: There was mild regurgitation. - Right ventricle: The cavity size was mildly dilated. Wall  thickness was normal. Systolic function was moderately reduced. - Right atrium: The atrium was mildly dilated.   Assessment/Plan:   Principal Problem:   CAP (community acquired pneumonia) Active Problems:   Acute-on-chronic kidney injury ([place])   Atrial fibrillation with RVR (HCC)   Acute systolic congestive heart failure (HCC)   Demand ischemia (HCC)   Essential hypertension   Cardiac pacemaker in situ   COPD (chronic obstructive pulmonary disease) (HCC)   Long-term (current) use of anticoagulants   Chronic kidney disease (CKD), stage III (moderate)   Chronic combined  systolic and diastolic congestive heart failure (HCC)   Non-ischemic cardiomyopathy (HCC)   Longstanding persistent atrial fibrillation (HCC)   G E R D   H/O Arterial embolism of leg    Depression   Protein-calorie malnutrition, severe (HCC)   Chronic pain   Normal coronary arteries-2003   PLAN: Lopressor 25 mg BID added by primary service. His SCr drifts up-in reviewing previous labs this appears to be near his baseline. He has seen Dr Jonnie Finner in the past.  Dr Acie Fredrickson to see.    Kerin Ransom PA-C 08/02/2015, 10:15 AM [phone]  Attending Note:   The patient was seen and examined.  Agree with  assessment and plan as noted above.  Changes made to the above note as needed.  No new recs.  Continues to have rapid atrial fib  Thayer Headings, Brooke Bonito., MD, [facility] 08/03/2015, 6:16 PM [address]  [place] Pager [phone]

## 2015-08-02 NOTE — Progress Notes (Signed)
Pt c/o of abdominal pain during evening med pass. Pt abdomen distended, taut, and hypoactive bowel sounds. Pt stated he had BM 3/14 but it was very small. Nurse contact the IM services on call Charlynn Grimes because this a new pt complaint. [place] currently at bedside with pt.

## 2015-08-02 NOTE — Progress Notes (Signed)
Internal Medicine Attending  Date: 08/02/2015  Patient name: [name] Medical record number: [id number] Date of birth: 06/19/1945 Age: 70 y.o. Gender: male  I saw and evaluated the patient. I reviewed the resident's note by Dr. [name] and I agree with the resident's findings and plans as documented in his progress note.  When seen on rounds this morning [name] was in a slightly better mood. He did refuse the morning blood draw as he was not notified he was about to be phlebotomized. We stressed to him the importance of being able to follow his kidney function and his digoxin level given his renal dysfunction. He was agreeable to allowing a blood draw later in the morning. Physical exam was notable for diffuse expiratory wheezes that were improved compared to yesterday with much better air movement. He remains irregularly irregular and tachycardic but this too was slightly down in rate compared to yesterday. The digoxin level was not drawn but his BUN and creatinine are slightly elevated compared to yesterday's value. That being said it looks like we are nearing a plateau. We will continue with the prednisone at 40 mg daily for a total of 10 days as well as continue with the aggressive bronchodilator therapy. This is undoubtedly driving his ventricular rate and with the improved pulmonary exam with less obstruction we decided to add back metoprolol at 25 mg twice daily. We will continue the diltiazem drip at 20 mg and the digoxin at the current dose although we will check a dig level tomorrow morning. He has completed his course of azithromycin. We will reassess his clinical status tomorrow with the above intervention and supportive care. I anticipate he will require at the very minimum another 72 hours of inpatient therapy before being stable from a cardiopulmonary standpoint for discharge.

## 2015-08-02 NOTE — Progress Notes (Signed)
   Subjective: Patient seen while receiving his breathing treatment. Continues to be tachycardic on 20 mg/hr Diltiazem gtt.   Objective: Vital signs in last 24 hours: Filed Vitals:   08/02/15 0500 08/02/15 0748 08/02/15 1129 08/02/15 1158  BP:  131/95 146/85   Pulse:  77 99   Temp:  98.2 F (36.8 C) 97.8 F (36.6 C)   TempSrc:  Oral Oral   Resp:  15 20   Height:      Weight: 166 lb 0.1 oz (75.3 kg)     SpO2:  99% 99% 96%   Weight change: 29 lb 12.2 oz (13.5 kg)  Intake/Output Summary (Last 24 hours) at 08/02/15 1206 Last data filed at 08/02/15 0800  Gross per 24 hour  Intake 1397.09 ml  Output   1425 ml  Net -27.91 ml   General: resting in bed Cardiac: irregularly irregular, tachycardic Pulm: wheezing bilateral lung fields improved compared to yesterday, bronchial breath sounds right upper lung field Abd: soft, nontender Ext: warm and well perfused   Assessment/Plan: Principal Problem:   CAP (community acquired pneumonia) Active Problems:   Essential hypertension   Cardiac pacemaker in situ   COPD (chronic obstructive pulmonary disease) ([place])   G E R D   Long-term (current) use of anticoagulants   H/O Arterial embolism of leg    Depression   Chronic kidney disease (CKD), stage III (moderate)   Chronic combined systolic and diastolic congestive heart failure (HCC)   Non-ischemic cardiomyopathy (HCC)   Longstanding persistent atrial fibrillation (HCC)   Protein-calorie malnutrition, severe (HCC)   Chronic pain   Acute-on-chronic kidney injury (HCC)   Atrial fibrillation with RVR (HCC)   Acute systolic congestive heart failure (HCC)   Normal coronary arteries-2003   Demand ischemia ([place])  A. Fib with RVR: CHADSVASc of 6 with 9.8% yearly risk of stroke. Likely exacerbated by respiratory condition. Rate continues to be elevated on 20 mg/hr Diltiazem gtt during rounds. Has some improvement in wheezing. We will add lower dose Metoprolol for rate  control. -Cardiology following -Eliquis 5 mg bid -Continue Diltiazem ggt -Add Metoprolol 25 mg po BID -Continue Digoxin 0.0625 mg po qd -Telemetry  COPD Exacerbation: Has continued wheezing on exam today, although improved compared to yesterday. Was given  -Duonebs q4hr -Xopenex q4h prn -Mucinex 600 mg BID -Prednisone 40 mg to complete 10 day course (3/11>>3/20) -Pulmicort nebulizer BID -O2 assessment prior to discharge -Monitor respiratory status   Community Acquired Pneumonia: CXR with mild patchy right lower lobe opacity suspicious of PNA. Patient was started on Azithromycin and Rocephin in the ED for CAP. Completed treatment of CAP with 5 total days Azithromycin. Has some improvement in wheezing today. -f/u Sputum culture >> few staph aureus, positive MRSA Nares, possibly from colonization. If patient not improving or worsens will need to consider escalation of abx   Systolic CHF: EF was 99991111 on 5//2016 ECHO. SCr from 1.60>>2.21>>2.33. UOP of 2.2 L, net down ~2500 mLs.  -Hold IV Lasix 40 mg BID -Daily weights -Strict I&Os -Added Metoprolol 25 mg BID -Hydralazine 10 mg TID -Imdur 15 mg qd   Chronic Pain:  -Percocet 10-325 mg q4hr prn for chronic hip and low back pain, bilateral hip replacements, and OA of multiple joints.   Dispo: Disposition is deferred at this time, awaiting improvement of current medical problems.  Anticipated discharge in approximately 1-3 day(s).        LOS: 5 days   Zada Finders, MD 08/02/2015, 12:06 PM

## 2015-08-02 NOTE — Progress Notes (Signed)
PT Cancellation Note  Patient Details Name: [name] MRN: [id number] DOB: [birth year]   Cancelled Treatment:    Reason Eval/Treat Not Completed: Patient declined, no reason specified.  Pt adamantly refused PT at this time.  Will try back another time.     Marae Cottrell, Thornton Papas 08/02/2015, 3:02 PM

## 2015-08-02 NOTE — Progress Notes (Signed)
OT Cancellation Note  Patient Details Name: [name] MRN: [id number] DOB: 1946-05-16   Cancelled Treatment:    Reason Eval/Treat Not Completed: Other (comment) Attempted to see earlier. Pt declined. Will attempt again tomorrow.  [place], OTR/L  [id number] 08/02/2015 08/02/2015, 5:55 PM

## 2015-08-03 DIAGNOSIS — N183 Chronic kidney disease, stage 3 (moderate): Secondary | ICD-10-CM

## 2015-08-03 DIAGNOSIS — E875 Hyperkalemia: Secondary | ICD-10-CM

## 2015-08-03 DIAGNOSIS — K567 Ileus, unspecified: Secondary | ICD-10-CM

## 2015-08-03 DIAGNOSIS — I481 Persistent atrial fibrillation: Secondary | ICD-10-CM

## 2015-08-03 DIAGNOSIS — N179 Acute kidney failure, unspecified: Secondary | ICD-10-CM

## 2015-08-03 DIAGNOSIS — Z22322 Carrier or suspected carrier of Methicillin resistant Staphylococcus aureus: Secondary | ICD-10-CM

## 2015-08-03 DIAGNOSIS — K56609 Unspecified intestinal obstruction, unspecified as to partial versus complete obstruction: Secondary | ICD-10-CM | POA: Diagnosis not present

## 2015-08-03 DIAGNOSIS — Z7901 Long term (current) use of anticoagulants: Secondary | ICD-10-CM

## 2015-08-03 LAB — URINALYSIS W MICROSCOPIC (NOT AT ARMC)
BILIRUBIN URINE: NEGATIVE
GLUCOSE, UA: NEGATIVE mg/dL
HGB URINE DIPSTICK: NEGATIVE
Ketones, ur: NEGATIVE mg/dL
Leukocytes, UA: NEGATIVE
NITRITE: NEGATIVE
PH: 5.5 (ref 5.0–8.0)
Protein, ur: NEGATIVE mg/dL
SPECIFIC GRAVITY, URINE: 1.019 (ref 1.005–1.030)

## 2015-08-03 LAB — BASIC METABOLIC PANEL
ANION GAP: 11 (ref 5–15)
ANION GAP: 14 (ref 5–15)
ANION GAP: 15 (ref 5–15)
BUN: 68 mg/dL — ABNORMAL HIGH (ref 6–20)
BUN: 67 mg/dL — ABNORMAL HIGH (ref 6–20)
BUN: 67 mg/dL — AB (ref 6–20)
CALCIUM: 9.9 mg/dL (ref 8.9–10.3)
CHLORIDE: 108 mmol/L (ref 101–111)
CHLORIDE: 109 mmol/L (ref 101–111)
CO2: 23 mmol/L (ref 22–32)
CO2: 22 mmol/L (ref 22–32)
CO2: 22 mmol/L (ref 22–32)
Calcium: 10 mg/dL (ref 8.9–10.3)
Calcium: 9.7 mg/dL (ref 8.9–10.3)
Chloride: 111 mmol/L (ref 101–111)
Creatinine, Ser: 2.66 mg/dL — ABNORMAL HIGH (ref 0.61–1.24)
Creatinine, Ser: 2.54 mg/dL — ABNORMAL HIGH (ref 0.61–1.24)
Creatinine, Ser: 2.41 mg/dL — ABNORMAL HIGH (ref 0.61–1.24)
GFR calc Af Amer: 28 mL/min — ABNORMAL LOW (ref >60)
GFR calc Af Amer: 29 mL/min — ABNORMAL LOW (ref >60)
GFR calc non Af Amer: 25 mL/min — ABNORMAL LOW (ref >60)
GFR, EST AFRICAN AMERICAN: 26 mL/min — AB (ref >60)
GFR, EST NON AFRICAN AMERICAN: 23 mL/min — AB (ref >60)
GFR, EST NON AFRICAN AMERICAN: 24 mL/min — AB (ref >60)
GLUCOSE: 103 mg/dL — AB (ref 65–99)
Glucose, Bld: 114 mg/dL — ABNORMAL HIGH (ref 65–99)
Glucose, Bld: 127 mg/dL — ABNORMAL HIGH (ref 65–99)
POTASSIUM: 6.8 mmol/L — AB (ref 3.5–5.1)
POTASSIUM: 6 mmol/L — AB (ref 3.5–5.1)
POTASSIUM: 6 mmol/L — AB (ref 3.5–5.1)
SODIUM: 146 mmol/L — AB (ref 135–145)
Sodium: 145 mmol/L (ref 135–145)
Sodium: 144 mmol/L (ref 135–145)

## 2015-08-03 LAB — TSH: TSH: 3.161 u[IU]/mL (ref 0.350–4.500)

## 2015-08-03 LAB — DIGOXIN LEVEL: Digoxin Level: 1 ng/mL (ref 0.8–2.0)

## 2015-08-03 LAB — MAGNESIUM: Magnesium: 2 mg/dL (ref 1.7–2.4)

## 2015-08-03 LAB — SODIUM, URINE, RANDOM: Sodium, Ur: 85 mmol/L

## 2015-08-03 LAB — CREATININE, URINE, RANDOM: Creatinine, Urine: 94.44 mg/dL

## 2015-08-03 MED ORDER — METOPROLOL TARTRATE 50 MG PO TABS
50.0000 mg | ORAL_TABLET | Freq: Two times a day (BID) | ORAL | Status: DC
  Administered 2015-08-03: 50 mg via ORAL
  Filled 2015-08-03: qty 1

## 2015-08-03 MED ORDER — METOPROLOL TARTRATE 1 MG/ML IV SOLN
10.0000 mg | Freq: Four times a day (QID) | INTRAVENOUS | Status: DC
  Administered 2015-08-04 (×3): 10 mg via INTRAVENOUS
  Filled 2015-08-03 (×3): qty 10

## 2015-08-03 MED ORDER — METOPROLOL TARTRATE 1 MG/ML IV SOLN: 20.0000 mg | Freq: Two times a day (BID) | INTRAVENOUS | Status: DC

## 2015-08-03 MED ORDER — SODIUM POLYSTYRENE SULFONATE 15 GM/60ML PO SUSP
30.0000 g | Freq: Once | ORAL | Status: AC
  Administered 2015-08-04: 30 g via RECTAL
  Filled 2015-08-03: qty 120

## 2015-08-03 MED ORDER — SODIUM CHLORIDE 0.9 % IV SOLN
1.0000 g | Freq: Once | INTRAVENOUS | Status: AC
  Administered 2015-08-03: 1 g via INTRAVENOUS
  Filled 2015-08-03: qty 10

## 2015-08-03 MED ORDER — ENOXAPARIN SODIUM 80 MG/0.8ML ~~LOC~~ SOLN
80.0000 mg | SUBCUTANEOUS | Status: DC
  Administered 2015-08-03: 80 mg via SUBCUTANEOUS
  Filled 2015-08-03: qty 0.8

## 2015-08-03 MED ORDER — PANTOPRAZOLE SODIUM 40 MG IV SOLR
40.0000 mg | INTRAVENOUS | Status: DC
  Administered 2015-08-03 – 2015-08-05 (×3): 40 mg via INTRAVENOUS
  Filled 2015-08-03 (×3): qty 40

## 2015-08-03 MED ORDER — SODIUM CHLORIDE 0.9 % IV SOLN
1.0000 g | Freq: Once | INTRAVENOUS | Status: AC
  Administered 2015-08-04: 1 g via INTRAVENOUS
  Filled 2015-08-03: qty 10

## 2015-08-03 MED ORDER — HYDRALAZINE HCL 20 MG/ML IJ SOLN
5.0000 mg | Freq: Three times a day (TID) | INTRAMUSCULAR | Status: DC
  Administered 2015-08-03 – 2015-08-04 (×3): 5 mg via INTRAVENOUS
  Filled 2015-08-03 (×3): qty 1

## 2015-08-03 MED ORDER — SODIUM CHLORIDE 0.9 % IV SOLN
INTRAVENOUS | Status: DC
  Administered 2015-08-03: 13:00:00 via INTRAVENOUS

## 2015-08-03 MED ORDER — INSULIN ASPART 100 UNIT/ML ~~LOC~~ SOLN
10.0000 [IU] | Freq: Once | SUBCUTANEOUS | Status: AC
  Administered 2015-08-04: 10 [IU] via SUBCUTANEOUS

## 2015-08-03 MED ORDER — BISACODYL 10 MG RE SUPP
10.0000 mg | Freq: Once | RECTAL | Status: AC
  Administered 2015-08-03: 10 mg via RECTAL
  Filled 2015-08-03: qty 1

## 2015-08-03 MED ORDER — SODIUM POLYSTYRENE SULFONATE 15 GM/60ML PO SUSP
15.0000 g | Freq: Once | ORAL | Status: AC
  Administered 2015-08-03: 15 g via RECTAL
  Filled 2015-08-03: qty 60

## 2015-08-03 MED ORDER — METOPROLOL TARTRATE 1 MG/ML IV SOLN
5.0000 mg | Freq: Four times a day (QID) | INTRAVENOUS | Status: DC
  Administered 2015-08-03: 5 mg via INTRAVENOUS
  Filled 2015-08-03: qty 5

## 2015-08-03 MED ORDER — DEXTROSE 50 % IV SOLN
1.0000 | Freq: Once | INTRAVENOUS | Status: AC
  Administered 2015-08-04: 50 mL via INTRAVENOUS
  Filled 2015-08-03: qty 50

## 2015-08-03 NOTE — Progress Notes (Signed)
Pt felt like going to pass out.  Had some pvc.  Duo treatment stopped did finish pulmicort because of sob  Nurse called Dr. Council Mechanic in

## 2015-08-03 NOTE — Progress Notes (Signed)
ANTICOAGULATION CONSULT NOTE - Initial Consult  Pharmacy Consult for lovenox Indication: atrial fibrillation  Allergies  Allergen Reactions  . 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 Penicillins Rash    Patient Measurements: Height: 6\' 3"  (190.5 cm) Weight: 182 lb 12.2 oz (82.9 kg) IBW/kg (Calculated) : 84.5  Vital Signs: Temp: 98.2 F (36.8 C) (03/16 1100) Temp Source: Oral (03/16 1100) BP: 148/93 mmHg (03/16 1100) Pulse Rate: 70 (03/16 1100)  Labs:  Recent Labs  08/01/15 0408 08/02/15 0930 08/03/15 0822  HGB 12.3*  --   --   HCT 39.5  --   --   PLT 223  --   --   CREATININE 2.33* 2.37* 2.66*    Estimated Creatinine Clearance: 29.9 mL/min (by C-G formula based on Cr of 2.66).  Assessment: 62 yom initially presented with SOB and afib. He is on chronic apixaban for history of afib. Renal function is worsening so transitioning to lovenox. However, this requires renal adjustment as well and if renal fxn continues to worsen, may need to consider IV heparin.   Goal of Therapy:  Anti-Xa level 0.6-1 units/ml 4hrs after LMWH dose given Monitor platelets by anticoagulation protocol: Yes   Plan:  - Lovenox 80mg  SQ Q24H starting tonight when the next dose of apixaban would have been due - CBC Q72H - F/u renal fxn, S&S of bleeding  Carriann Hesse, Rande Lawman 08/03/2015,12:29 PM

## 2015-08-03 NOTE — Progress Notes (Signed)
Attempted to insert nasogastric tube per order and pt refused. Pt stated that he does not want it right now and would like to wait to see if things progress on its own. 2 nurses Sai Zinn,RN and Tomasa Hose educated pt about the [place] of tube and why it is needed. Pt still refused and stated if things do not speed along then he would try it later. MD Boswell notified.

## 2015-08-03 NOTE — Progress Notes (Signed)
PT Cancellation Note  Patient Details Name: PIKE AVELLA MRN: [id number] DOB: 05-Aug-1945   Cancelled Treatment:    Reason Eval/Treat Not Completed: Patient not medically ready (K+ 6.8 and difficult to arouse).  PT will continue to follow acutely.   Collie Siad PT, DPT  Pager: [phone] Phone: [phone] 08/03/2015, 10:34 AM

## 2015-08-03 NOTE — Progress Notes (Signed)
CRITICAL VALUE ALERT  Critical value received:  K+ 6.8  Date of notification:  08/03/2015  Time of notification:  0952  Critical value read back:Yes.    Nurse who received alert:  Pecolia Ades rn  MD notified (1st page):  Dr. [name]  Time of first page:  [phone]  MD notified (2nd page):dr krall  Time of second page:  Responding MD:  1000  Time MD responded:  1001 dr Randell Patient

## 2015-08-03 NOTE — Progress Notes (Signed)
Patient ID: [name], male   DOB: January 28, 1946, 70 y.o.   MRN: [id number] Medicine attending: I personally examined this patient this morning together with resident physicians [name] and Jacques Earthly and I concur with their evaluation and management plan which we discussed together. Persistent atrial fibrillation with rapid ventricular response. Now on maximum dose of parenteral diltiazem. Beta blocker added yesterday but rates still unacceptably high this morning. Further increase in dose will be made. He has already failed alternative antiarrhythmics including amiodarone and Tikosyn. Will defer to cardiology for any other ideas. On exam, mostly upper airway noise over the trachea. Expiratory wheezing and rhonchi left lung. He has developed an abdominal ileus. Nontender. Tympanitic to percussion. Neuro exam with 5 over 5 motor strength lower extremities and reflexes 2+ symmetric at the knees excluding impending cord compression as reason for his ileus. He is on no anticholinergic meds except for the atropine component of his duo nebs. He is requesting Percocet on a regular basis and getting 2-3 doses per day. Lab returned showing hyperkalemia with potassium 6.8, deterioration  in renal function from his baseline with creatinine rise from 2.2 on March 12 22.7 today. Anion gap 11. We will give calcium. Kayexalate enema in view of the ileus making oral route not practical. We will get nephrology involved early since we anticipate further deterioration.

## 2015-08-03 NOTE — Progress Notes (Signed)
Pt diet NPO per [facility]

## 2015-08-03 NOTE — Progress Notes (Signed)
Inserted ng tube, pt projectile vomitted large amount of brown drainage. Ng tube placed on int lws. Immediately drained 1000 cc of brown yellowish drainage. Pt states feels better

## 2015-08-03 NOTE — Care Management Important Message (Signed)
Important Message  Patient Details  Name: [name] MRN: [id number] Date of Birth: 1946-05-11   Medicare Important Message Given:  Yes    Erenest Rasher, RN 08/03/2015, 3:09 PM

## 2015-08-03 NOTE — Progress Notes (Signed)
Subjective:   Pt has developed an iliues  His meds have been changed to IV    Objective:  Vital Signs in the last 24 hours: Temp:  [97.9 F (36.6 C)-98.9 F (37.2 C)] 98.7 F (37.1 C) (03/16 1700) Pulse Rate:  [50-129] 71 (03/16 1700) Resp:  [14-24] 21 (03/16 1700) BP: (109-155)/(75-119) 140/75 mmHg (03/16 1700) SpO2:  [95 %-100 %] 99 % (03/16 1700) Weight:  [182 lb 12.2 oz (82.9 kg)] 182 lb 12.2 oz (82.9 kg) (03/16 0458)  Intake/Output from previous day:  Intake/Output Summary (Last 24 hours) at 08/03/15 1819 Last data filed at 08/03/15 1700  Gross per 24 hour  Intake 749.17 ml  Output   2950 ml  Net -2200.83 ml    Physical Exam: BP 140/75 mmHg  Pulse 71  Temp(Src) 98.7 F (37.1 C) (Oral)  Resp 21  Ht 6\' 3"  (1.905 m)  Wt 182 lb 12.2 oz (82.9 kg)  BMI 22.84 kg/m2  SpO2 99% HEENT:   /at Lungs:  Rales Cor: irreg. Irreg.  Abd:  Distended  Ext no c/c/e    Rate: 115  Rhythm: atrial fibrillation  Lab Results:  Recent Labs  08/01/15 0408  WBC 13.0*  HGB 12.3*  PLT 223    Recent Labs  08/03/15 0822 08/03/15 1137  NA 145 144  K 6.8* 6.0*  CL 111 108  CO2 23 22  GLUCOSE 114* 103*  BUN 68* 67*  CREATININE 2.66* 2.54*   No results for input(s): TROPONINI in the last 72 hours.  Invalid input(s): CK, MB No results for input(s): INR in the last 72 hours.  Scheduled Meds: . antiseptic oral rinse  7 mL Mouth Rinse BID  . budesonide (PULMICORT) nebulizer solution  0.25 mg Nebulization BID  . buPROPion  75 mg Oral BID  . digoxin  0.0625 mg Oral Daily  . enoxaparin (LOVENOX) injection  80 mg Subcutaneous Q24H  . feeding supplement  1 Container Oral TID BM  . gabapentin  300 mg Oral TID  . guaiFENesin  600 mg Oral BID  . hydrALAZINE  5 mg Intravenous 3 times per day  . isosorbide mononitrate  15 mg Oral Daily  . metoprolol  5 mg Intravenous 4 times per day  . mirtazapine  15 mg Oral QHS  . pantoprazole (PROTONIX) IV  40 mg Intravenous Q24H  .  polyethylene glycol  17 g Oral Daily  . predniSONE  40 mg Oral Q breakfast  . rosuvastatin  20 mg Oral q1800  . senna-docusate  1 tablet Oral Daily   Continuous Infusions: . sodium chloride 50 mL/hr at 08/03/15 1237  . diltiazem (CARDIZEM) infusion 20 mg/hr (08/03/15 1418)   PRN Meds:.feeding supplement, levalbuterol, oxyCODONE-acetaminophen **AND** oxyCODONE   Imaging: Imaging results have been reviewed  Cardiac Studies:  Echo May 2016 Study Conclusions  - Left ventricle: The cavity size was normal. Systolic function was  moderately to severely reduced. The estimated ejection fraction  was in the range of 30% to 35%. Diffuse hypokinesis. - Aortic valve: There was mild regurgitation. - Right ventricle: The cavity size was mildly dilated. Wall  thickness was normal. Systolic function was moderately reduced. - Right atrium: The atrium was mildly dilated.   Assessment/Plan:   Principal Problem:   CAP (community acquired pneumonia) Active Problems:   Essential hypertension   Cardiac pacemaker in situ   COPD (chronic obstructive pulmonary disease) (HCC)   G E R D   Long-term (current) use of anticoagulants  H/O Arterial embolism of leg    Depression   Chronic kidney disease (CKD), stage III (moderate)   Chronic combined systolic and diastolic congestive heart failure (HCC)   Non-ischemic cardiomyopathy (HCC)   Longstanding persistent atrial fibrillation (HCC)   Protein-calorie malnutrition, severe (HCC)   Chronic pain   Acute-on-chronic kidney injury ([place])   Atrial fibrillation with RVR (HCC)   Acute systolic congestive heart failure (HCC)   Normal coronary arteries-2003   Demand ischemia (HCC)   Hyperkalemia   Intestinal obstruction (HCC)  A/P  1. Atrial fib:   He is on high dose Dilt and low dose metoprolol .   His digoxin was not converted to IV I suspect his HR is high due to his ilius and pneumonia He has developed thyroiditis with amiodarone . Is not a  candidate for tikosyn due to CKD.  Continue current meds.    Consider Increasing  IV metoprolol to 10 mg Q6 Hr. ( as long as it does not cause increased pulmonary issues )   2. HTN:   He is not on a diuretic.    Could consider IV lasix .   Will leave that up to the medical service.     Eliav Mechling, Wonda Cheng, MD  08/03/2015 6:28 PM    [facility] [place],  [place], [place]  [id number] Pager [phone] Phone: [phone]; Fax: [phone]   [facility]  [address] [place], [place]  [address] [phone]   Fax [phone]

## 2015-08-03 NOTE — Care Management Note (Signed)
Case Management Note  Patient Details  Name: [name] MRN: [id number] Date of Birth: 23-Nov-1945  Subjective/Objective:      Persistent atrial fibrillation with RVR, ileus, and hyperkalemia              Action/Plan: Discharge Planning:  NCM spoke to pt at bedside. States he lives at home alone and was independent prior to admission. He could afford medications. He drove himself to his appts. Has girlfriend, Jules Schick # [phone] and Freada Bergeron # [phone] at bedside. Waiting final recommendation for home.  PCP- Zada Finders MD  Expected Discharge Date:                  Expected Discharge Plan:  [place]  In-House Referral:  NA  Discharge planning Services  CM Consult   Status of Service:  In process, will continue to follow  Medicare Important Message Given:  Yes Date Medicare IM Given:    Medicare IM give by:    Date Additional Medicare IM Given:    Additional Medicare Important Message give by:     If discussed at [place] of Stay Meetings, dates discussed:    Additional Comments:  Erenest Rasher, RN 08/03/2015, 2:56 PM

## 2015-08-03 NOTE — Progress Notes (Signed)
OT Cancellation Note  Patient Details Name: [name] MRN: [id number] DOB: 1946/01/12   Cancelled Treatment:    Reason Eval/Treat Not Completed: Patient not medically ready (K+ 6.8 and difficult to arouse). Will check back as able and apporpriate for OT eval/treat. Thank you for the order.   Chrys Racer , MS, OTR/L, CLT Pager: [phone]  08/03/2015, 11:51 AM

## 2015-08-03 NOTE — Progress Notes (Addendum)
Nutrition Follow-up  DOCUMENTATION CODES:   Severe malnutrition in context of chronic illness  INTERVENTION:  -Continue Boost Breeze po TID, each supplement provides 250 kcal and 9 grams of protein -RD continue to monitor for needs  NUTRITION DIAGNOSIS:   Malnutrition related to chronic illness as evidenced by severe depletion of muscle mass, moderate depletion of body fat, moderate depletions of muscle mass.  GOAL:   Patient will meet greater than or equal to 90% of their needs  MONITOR:   PO intake, Supplement acceptance, Skin, I & O's  ASSESSMENT:   70 year old male with a past medical history of systolic CHF EF 99991111, ETOH abuse, COPD, CKD III, CAD presenting with complaints of shortness of breath, cough and chills.    Mr. Pask is feeling ok today. He vomited earlier when RN placed an NG Tube. She then placed NGT to [facility] producing 1000cc drainage.  He states that prior to this, he wasn't feeling very well, didn't eat much for breakfast or lunch. Per MD note, pt likely suffering from Ileus. Last BM was 2 days ago, no flatus. He is enjoying boost breeze, no issues there. Weight is up 16# since yesterday. Receiving NS @ 86mL/hr currently. Likely bi-product of ileus.  Labs and Medications reviewed.  Diet Order:  Diet NPO time specified  Skin:  Reviewed, no issues  Last BM:  3/10  Height:   Ht Readings from Last 1 Encounters:  07/29/15 6\' 3"  (1.905 m)    Weight:   Wt Readings from Last 1 Encounters:  08/03/15 182 lb 12.2 oz (82.9 kg)    Ideal Body Weight:  89.1 kg  BMI:  Body mass index is 22.84 kg/(m^2).  Estimated Nutritional Needs:   Kcal:  2000-2200  Protein:  90-100 gm  Fluid:  2 L  EDUCATION NEEDS:   No education needs identified at this time  Satira Anis. Haunani Dickard, MS, RD LDN After Hours/Weekend Pager [phone]

## 2015-08-03 NOTE — Progress Notes (Signed)
Subjective: Overnight patient had complaints of abdominal pain and no bowel movements in 2 days. He says he is not passing gas. His abdomen is distended and tense. KUB overnight shows dilated gas-filled bowel with likely ileus. He does not have any nausea or vomiting.  Objective: Vital signs in last 24 hours: Filed Vitals:   08/03/15 0742 08/03/15 0954 08/03/15 1048 08/03/15 1100  BP: 134/96 147/119 155/88 148/93  Pulse: 53 62 129 70  Temp:    98.2 F (36.8 C)  TempSrc:    Oral  Resp: 23 14 24 14   Height:      Weight:      SpO2: 96% 99% 95% 100%   Weight change: 16 lb 12.1 oz (7.6 kg)  Intake/Output Summary (Last 24 hours) at 08/03/15 1154 Last data filed at 08/03/15 1100  Gross per 24 hour  Intake 1092.33 ml  Output   1300 ml  Net -207.67 ml   General: resting in bed, mild distress Cardiac: irregularly irregular, tachycardic at 100-120 Pulm: coarse breath sounds, slight wheezing bilateral lung fields, improving compared to yesterday Abd: distended and tense, hypoactive bowel sounds, minimal tenderness to palpation Ext: warm and well perfused   Assessment/Plan: Principal Problem:   CAP (community acquired pneumonia) Active Problems:   Essential hypertension   Cardiac pacemaker in situ   COPD (chronic obstructive pulmonary disease) (HCC)   G E R D   Long-term (current) use of anticoagulants   H/O Arterial embolism of leg    Depression   Chronic kidney disease (CKD), stage III (moderate)   Chronic combined systolic and diastolic congestive heart failure (HCC)   Non-ischemic cardiomyopathy (HCC)   Longstanding persistent atrial fibrillation (HCC)   Protein-calorie malnutrition, severe (HCC)   Chronic pain   Acute-on-chronic kidney injury (HCC)   Atrial fibrillation with RVR (HCC)   Acute systolic congestive heart failure (HCC)   Normal coronary arteries-2003   Demand ischemia ([place])  A. Fib with RVR: CHADSVASc of 6 with 9.8% yearly risk of stroke. Rate  continues to be elevated on 20 mg/hr Diltiazem gtt during rounds up to 120. Has some continued improvement in wheezing. Repeat digoxin level is 1.0. -Cardiology following -Hold Eliquis 5 mg bid in setting of elevated SCr -Start therapeutic Lovenox per pharmacy for anticoagulation -Continue Diltiazem ggt, titrate down as tolerated -Increase Metoprolol to 50 mg po BID -Continue Digoxin 0.0625 mg po qd   COPD Exacerbation: Has some improvement in wheezing today with continued coarse breath sounds. Had increased work of breathing this morning and felt like he was going to pass out during duoneb treatment. -Duonebs q4hr -Xopenex q4h prn -Mucinex 600 mg BID -Prednisone 40 mg to complete 10 day course (3/11>>3/20) -Pulmicort nebulizer BID -O2 assessment prior to discharge -Monitor respiratory status  Acute on CKD: Patient with CKD 3 thought secondary to cardiorenal syndrome. His baseline SCr seems to be around 1.70-2.00. He was seen by Nephrology 4 weeks ago and at that time not considered a great candidate for dialysis if renal disease progresses. His SCr has increased this morning to 2.66 from 2.37. He is also hyperkalemic at 6.8 today. There is concern that his renal disease is worsening at this time. Renal U/S last month showed increased echotexture consistent with renal disease and was without mass or obstruction. -Calcium gluconate once -Kayexalate 15 g rectal once -f/u BMP today -IVF NS @ 50 for 8 hours -Consider Nephrology consult   Ileus: Patient with increasingly distended, taut abdomen compared to prior. Has hypoactive  bowel sounds with minimal tenderness to palpation. KUB shows air-filled bowels with likely ileus. He says last BM was 2 days ago and he is not passing gas. Patient is on chronic pain medications which is scheduled prn during hospital stay, with last dose given last night. Patient refused NG tube overnight and there was difficulty with placement this morning.  -Dulcolax  suppository given once -Will likely need decompression via NG tube if patient agreeable -Hold opioids -NPO  Systolic CHF: EF was 99991111 on 5//2016 ECHO. SCr from 1.60>>2.21>>2.33. UOP of 2.2 L, net down ~2500 mLs.  -Hold IV Lasix 40 mg BID -Daily weights -Strict I&Os -Increase Metoprolol to 50 mg BID -Hydralazine 10 mg TID -Imdur 15 mg qd  Community Acquired Pneumonia: CXR with mild patchy right lower lobe opacity suspicious of PNA. Patient was started on Azithromycin and Rocephin in the ED for CAP. Completed treatment of CAP with 5 total days Azithromycin. Has some continued improvement in wheezing today. -f/u Sputum culture >> few staph aureus, positive MRSA Nares, possibly from colonization. If patient not improving or worsens will need to consider escalation of abx   Chronic Pain:  On Percocet 10-325 mg q4hr at home prn for chronic hip and low back pain, bilateral hip replacements, and OA of multiple joints. -Hold opioids in setting of ileus   Dispo: Disposition is deferred at this time, awaiting improvement of current medical problems.  Anticipated discharge in approximately 2-3 day(s).        LOS: 6 days   Zada Finders, MD 08/03/2015, 11:54 AM

## 2015-08-04 ENCOUNTER — Inpatient Hospital Stay (HOSPITAL_COMMUNITY): Payer: Commercial Managed Care - HMO

## 2015-08-04 DIAGNOSIS — K567 Ileus, unspecified: Secondary | ICD-10-CM | POA: Diagnosis not present

## 2015-08-04 DIAGNOSIS — I252 Old myocardial infarction: Secondary | ICD-10-CM

## 2015-08-04 DIAGNOSIS — I429 Cardiomyopathy, unspecified: Secondary | ICD-10-CM

## 2015-08-04 DIAGNOSIS — Z950 Presence of cardiac pacemaker: Secondary | ICD-10-CM

## 2015-08-04 DIAGNOSIS — I2510 Atherosclerotic heart disease of native coronary artery without angina pectoris: Secondary | ICD-10-CM

## 2015-08-04 DIAGNOSIS — Z978 Presence of other specified devices: Secondary | ICD-10-CM

## 2015-08-04 LAB — BASIC METABOLIC PANEL
Anion gap: 15 (ref 5–15)
BUN: 64 mg/dL — AB (ref 6–20)
CO2: 22 mmol/L (ref 22–32)
CREATININE: 2.16 mg/dL — AB (ref 0.61–1.24)
Calcium: 9.3 mg/dL (ref 8.9–10.3)
Chloride: 109 mmol/L (ref 101–111)
GFR, EST AFRICAN AMERICAN: 34 mL/min — AB (ref >60)
GFR, EST NON AFRICAN AMERICAN: 29 mL/min — AB (ref >60)
Glucose, Bld: 148 mg/dL — ABNORMAL HIGH (ref 65–99)
POTASSIUM: 5.4 mmol/L — AB (ref 3.5–5.1)
SODIUM: 146 mmol/L — AB (ref 135–145)

## 2015-08-04 LAB — COMPREHENSIVE METABOLIC PANEL
ALT: 83 U/L — ABNORMAL HIGH (ref 17–63)
AST: 47 U/L — AB (ref 15–41)
Albumin: 3.2 g/dL — ABNORMAL LOW (ref 3.5–5.0)
Alkaline Phosphatase: 62 U/L (ref 38–126)
Anion gap: 15 (ref 5–15)
BILIRUBIN TOTAL: 0.8 mg/dL (ref 0.3–1.2)
BUN: 67 mg/dL — AB (ref 6–20)
CO2: 21 mmol/L — ABNORMAL LOW (ref 22–32)
Calcium: 9.5 mg/dL (ref 8.9–10.3)
Chloride: 108 mmol/L (ref 101–111)
Creatinine, Ser: 2.3 mg/dL — ABNORMAL HIGH (ref 0.61–1.24)
GFR, EST AFRICAN AMERICAN: 31 mL/min — AB (ref >60)
GFR, EST NON AFRICAN AMERICAN: 27 mL/min — AB (ref >60)
Glucose, Bld: 84 mg/dL (ref 65–99)
POTASSIUM: 5.4 mmol/L — AB (ref 3.5–5.1)
Sodium: 144 mmol/L (ref 135–145)
TOTAL PROTEIN: 6.5 g/dL (ref 6.5–8.1)

## 2015-08-04 LAB — CBC
HEMATOCRIT: 45.7 % (ref 39.0–52.0)
Hemoglobin: 14.7 g/dL (ref 13.0–17.0)
MCH: 29.8 pg (ref 26.0–34.0)
MCHC: 32.2 g/dL (ref 30.0–36.0)
MCV: 92.7 fL (ref 78.0–100.0)
Platelets: 281 10*3/uL (ref 150–400)
RBC: 4.93 MIL/uL (ref 4.22–5.81)
RDW: 15.6 % — AB (ref 11.5–15.5)
WBC: 14.6 10*3/uL — AB (ref 4.0–10.5)

## 2015-08-04 LAB — UREA NITROGEN, URINE: UREA NITROGEN UR: 899 mg/dL

## 2015-08-04 MED ORDER — MORPHINE SULFATE (PF) 2 MG/ML IV SOLN
1.0000 mg | Freq: Once | INTRAVENOUS | Status: AC
  Administered 2015-08-04: 1 mg via INTRAVENOUS
  Filled 2015-08-04: qty 1

## 2015-08-04 MED ORDER — DIGOXIN 0.25 MG/ML IJ SOLN
0.0625 mg | Freq: Every day | INTRAMUSCULAR | Status: DC
  Administered 2015-08-04 – 2015-08-05 (×2): 0.0625 mg via INTRAVENOUS
  Filled 2015-08-04 (×2): qty 2

## 2015-08-04 MED ORDER — LEVALBUTEROL HCL 0.63 MG/3ML IN NEBU
0.6300 mg | INHALATION_SOLUTION | Freq: Four times a day (QID) | RESPIRATORY_TRACT | Status: DC
  Administered 2015-08-04 – 2015-08-07 (×14): 0.63 mg via RESPIRATORY_TRACT
  Filled 2015-08-04 (×15): qty 3

## 2015-08-04 MED ORDER — ENOXAPARIN SODIUM 60 MG/0.6ML ~~LOC~~ SOLN
60.0000 mg | SUBCUTANEOUS | Status: DC
  Administered 2015-08-04: 60 mg via SUBCUTANEOUS
  Filled 2015-08-04: qty 0.6

## 2015-08-04 MED ORDER — METHYLPREDNISOLONE SODIUM SUCC 40 MG IJ SOLR
40.0000 mg | Freq: Every day | INTRAMUSCULAR | Status: DC
  Administered 2015-08-04 – 2015-08-05 (×2): 40 mg via INTRAVENOUS
  Filled 2015-08-04 (×2): qty 1

## 2015-08-04 MED ORDER — METOPROLOL TARTRATE 1 MG/ML IV SOLN
15.0000 mg | Freq: Four times a day (QID) | INTRAVENOUS | Status: DC
  Administered 2015-08-04 – 2015-08-05 (×3): 15 mg via INTRAVENOUS
  Filled 2015-08-04 (×3): qty 15

## 2015-08-04 MED ORDER — LEVALBUTEROL HCL 0.63 MG/3ML IN NEBU
0.6300 mg | INHALATION_SOLUTION | RESPIRATORY_TRACT | Status: DC | PRN
  Administered 2015-08-04 – 2015-08-08 (×4): 0.63 mg via RESPIRATORY_TRACT
  Filled 2015-08-04 (×3): qty 3

## 2015-08-04 MED ORDER — IPRATROPIUM BROMIDE 0.02 % IN SOLN
0.5000 mg | RESPIRATORY_TRACT | Status: DC | PRN
  Administered 2015-08-04 – 2015-08-08 (×3): 0.5 mg via RESPIRATORY_TRACT
  Filled 2015-08-04 (×3): qty 2.5

## 2015-08-04 NOTE — Progress Notes (Signed)
Occupational Therapy Evaluation Patient Details Name: [name] MRN: [id number] DOB: May 05, 1946 Today's Date: 08/04/2015    History of Present Illness 70 year old male with a past medical history of chronic systolic CHF with nonischemic DCM -EF 30-35%. He had normal coronary arteries by cath in 2003 and low risk Myoview Feb 2015. Echo May 2016- EF 30-35%. There is also a history of ETOH abuse, COPD, CKD III, and CAF with SSS. He is s/p St Jude PTVDP '04-gen change 2010. ON arrival in ED he was found to be in AF w RVR. CXR showed bilateral effusions and ? Atelectasis vs PNA in RLL.    Clinical Impression   Limited evaluation. PTA, pt states he lived alone and was mod I with mobility and ADL @ RW level. Pt appeared agitated throughout assessment. Per chart, pt ambulated to bathroom with staff. Declined any further mobility than rolling in bed. Attempted to educate on importance of mobility without success. If pt progresses, he may be appropriate for HHOT. If not, he will need SNF. Will follow up next week.     Follow Up Recommendations  Supervision/Assistance - 24 hour;Home health OT  - pending progress   Equipment Recommendations  3 in 1 bedside comode    Recommendations for Other Services       Precautions / Restrictions Precautions Precautions: Fall;ICD/Pacemaker      Mobility Bed Mobility Overal bed mobility: Needs Assistance Bed Mobility: Rolling Rolling: Min assist            Transfers                 General transfer comment: declined    Balance                                            ADL Overall ADL's : Needs assistance/impaired                                     Functional mobility during ADLs: +2 for physical assistance General ADL Comments: Pt declined to aprticipate any further. Able to roll with min A in bed.   Attempted to gain information regarding home situation- will need to further assess.      Vision     Perception     Praxis      Pertinent Vitals/Pain Pain Assessment: Faces Faces Pain Scale: Hurts little more Pain Location: stomach Pain Descriptors / Indicators: Grimacing;Guarding Pain Intervention(s): Limited activity within patient's tolerance     Hand Dominance Right   Extremity/Trunk Assessment Upper Extremity Assessment Upper Extremity Assessment: Generalized weakness   Lower Extremity Assessment Lower Extremity Assessment: Generalized weakness       Communication Communication Communication: No difficulties   Cognition Arousal/Alertness: Awake/alert Behavior During Therapy: Agitated Overall Cognitive Status: Within Functional Limits for tasks assessed (most likely at baseline)                     General Comments   "fiance" present during eval.     Exercises       Shoulder Instructions      Home Living Family/patient expects to be discharged to:: Private residence Living Arrangements: Alone Available Help at Discharge: Family;Other (Comment) (refused to answer because "it wasn't my business") Type of Home: House Home Access: Stairs  to enter Entrance Stairs-Number of Steps: 1 Entrance Stairs-Rails: None Home Layout: One level               Home Equipment: Walker - 2 wheels;Cane - single point;Shower seat   Additional Comments: tofurther assess      Prior Functioning/Environment Level of Independence: Independent with assistive device(s)        Comments: walker for mobility    OT Diagnosis: Generalized weakness;Acute pain   OT Problem List: Decreased strength;Decreased activity tolerance;Impaired balance (sitting and/or standing);Decreased knowledge of use of DME or AE;Cardiopulmonary status limiting activity;Pain   OT Treatment/Interventions: Self-care/ADL training;Therapeutic exercise;Energy conservation;DME and/or AE instruction;Therapeutic activities;Patient/family education;Balance training    OT  Goals(Current goals can be found in the care plan section) Acute Rehab OT Goals Patient Stated Goal: for people to leave him alone OT Goal Formulation: With patient Time For Goal Achievement: 08/18/15 Potential to Achieve Goals: Good  OT Frequency: Min 2X/week   Barriers to D/C:            Co-evaluation              End of Session    Activity Tolerance: Treatment limited secondary to agitation Patient left: in bed;with call bell/phone within reach;with bed alarm set;with family/visitor present   Time: [id number] OT Time Calculation (min): 15 min Charges:  OT General Charges $OT Visit: 1 Procedure OT Evaluation $OT Eval Moderate Complexity: 1 Procedure G-Codes:    Rhylynn Perdomo,HILLARY 2015/08/27, 4:30 PM   [facility], OTR/L  [phone] 08/27/15

## 2015-08-04 NOTE — Progress Notes (Signed)
Patients refused blood draw this am, Dr. Simona Huh was notified of patient's refusal  and made aware of patients respiratory status as well, very winded and some short of breath after kayexalate was given with turning. Patient repositioned and increased O2 at 3l/[place]. Placed in high fowlers position and patient with some relief. Bilaterally with some wheezing diminshed breath sound, given breathing treatment earlier with some relief. Above symptoms MD on call made aware will continue to monitor patient.

## 2015-08-04 NOTE — Progress Notes (Signed)
Physical Therapy Progress Note    08/04/15 1411  PT Visit Information  Last PT Received On 08/04/15  Reason Eval/Treat Not Completed Patient declined, no reason specified;Fatigue/lethargy limiting ability to participate   Patient reports he is too tired to work with PT.  "I'm trying to get my bearings"  Will return tomorrow for PT evaluation.  [name] [name], [place] Pager [phone]

## 2015-08-04 NOTE — Progress Notes (Signed)
Patient ID: [name], male   DOB: October 19, 1945, 70 y.o.   MRN: [id number] Medicine attending: I examined this patient this morning together with resident physician [name] and I concur with his evaluation and management plan which we discussed together. The patient developed an ileus over the last few days. Only obvious risk factor is hyperkalemia and intermittent use of low-dose oral narcotics. He vomited yesterday. A nasogastric tube had to be placed. His heart rate coincidently has come under better control. He remains on a diltiazem infusion at maximum dose. We are titrating his beta blocker. Currently up to 10 mg every 6 hours of metoprolol. All essential medications being administered by vein in view of ileus and now NG tube. The patient is more alert and interactive today. He is hungry. Complains that we are not feeding him. Lungs are overall clear and resonant to percussion. Irregularly irregular cardiac rhythm. Abdomen less distended but still tympanitic and bowel sounds are absent. No peripheral edema. Potassium down following 2 doses of Kayexalate given per rectum and parenteral calcium. Trend for improvement in his renal function compared with yesterday Persistent leukocytosis. Partial problem list: #1. Poorly controlled atrial fibrillation #2. Adynamic ileus #3. Acute on chronic renal failure #4. Hyperkalemia secondary to #3. #5. Acute on chronic systolic heart failure secondary to rapid atrial fibrillation #6. Early pulmonary infiltrate on admission chest x-ray treated with a short course of antibiotics #7. Obstructive airway disease #8. 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 Tachybradycardia syndrome status post pacemaker #10. Coronary artery disease with history of previous MI #11. Cardiomyopathy secondary to #10 #12.  History of portal and superior mesenteric vein thrombosis

## 2015-08-04 NOTE — Progress Notes (Signed)
UR COMPLETED  

## 2015-08-04 NOTE — Progress Notes (Signed)
[place] for lovenox Indication: atrial fibrillation  Allergies  Allergen Reactions  . 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 Penicillins Rash    Patient Measurements: Height: 6\' 3"  (190.5 cm) Weight: 140 lb 6.9 oz (63.7 kg) IBW/kg (Calculated) : 84.5  Vital Signs: Temp: 98.3 F (36.8 C) (03/17 0700) Temp Source: Oral (03/17 0700) BP: 111/79 mmHg (03/17 0700) Pulse Rate: 233 (03/17 0700)  Labs:  Recent Labs  08/03/15 1137 08/03/15 2200 08/04/15 0718  HGB  --   --  14.7  HCT  --   --  45.7  PLT  --   --  281  CREATININE 2.54* 2.41* 2.30*    Estimated Creatinine Clearance: 26.5 mL/min (by C-G formula based on Cr of 2.3).  Assessment: 28 yom initially presented with SOB and afib. He is on chronic apixaban for history of afib. Renal function worsened and developed an ileus so he was transitioned to lovenox. However, this requires renal adjustment as well and if renal fxn continues to worsen, may need to consider IV heparin. Of note, documented weight has decreased so lovenox dose will need to be adjusted.    Goal of Therapy:  Anti-Xa level 0.6-1 units/ml 4hrs after LMWH dose given Monitor platelets by anticoagulation protocol: Yes   Plan:  - Change lovenox to 60mg  SQ Q24H - CBC Q72H - F/u renal fxn, S&S of bleeding  Aariv Medlock, Rande Lawman 08/04/2015,10:22 AM

## 2015-08-04 NOTE — Progress Notes (Signed)
Primary cardiology :  Fransico Him, MD  Subjective:   Pt has developed an iliues  His meds have been changed to IV    Objective:  Vital Signs in the last 24 hours: Temp:  [98 F (36.7 C)-98.9 F (37.2 C)] 98.6 F (37 C) (03/17 1100) Pulse Rate:  [49-233] 49 (03/17 0900) Resp:  [15-25] 15 (03/17 1000) BP: (111-191)/(50-105) 122/80 mmHg (03/17 1000) SpO2:  [83 %-99 %] 99 % (03/17 0900) Weight:  [140 lb 6.9 oz (63.7 kg)] 140 lb 6.9 oz (63.7 kg) (03/17 0409)  Intake/Output from previous day:  Intake/Output Summary (Last 24 hours) at 08/04/15 1303 Last data filed at 08/04/15 0900  Gross per 24 hour  Intake   1175 ml  Output   4675 ml  Net  -3500 ml    Physical Exam: BP 122/80 mmHg  Pulse 49  Temp(Src) 98.6 F (37 C) (Oral)  Resp 15  Ht 6\' 3"  (1.905 m)  Wt 140 lb 6.9 oz (63.7 kg)  BMI 17.55 kg/m2  SpO2 99% HEENT:   /at Lungs:  Rales Cor: irreg. Irreg.  Abd:  Distended  Ext no c/c/e    Rate: 95-100  Rhythm: atrial fibrillation  Lab Results:  Recent Labs  08/04/15 0718  WBC 14.6*  HGB 14.7  PLT 281    Recent Labs  08/03/15 2200 08/04/15 0718  NA 146* 144  K 6.0* 5.4*  CL 109 108  CO2 22 21*  GLUCOSE 127* 84  BUN 67* 67*  CREATININE 2.41* 2.30*   No results for input(s): TROPONINI in the last 72 hours.  Invalid input(s): CK, MB No results for input(s): INR in the last 72 hours.  Scheduled Meds: . antiseptic oral rinse  7 mL Mouth Rinse BID  . budesonide (PULMICORT) nebulizer solution  0.25 mg Nebulization BID  . digoxin  0.0625 mg Intravenous Daily  . enoxaparin (LOVENOX) injection  60 mg Subcutaneous Q24H  . feeding supplement  1 Container Oral TID BM  . levalbuterol  0.63 mg Nebulization Q6H  . methylPREDNISolone (SOLU-MEDROL) injection  40 mg Intravenous Daily  . metoprolol  10 mg Intravenous 4 times per day  . pantoprazole (PROTONIX) IV  40 mg Intravenous Q24H   Continuous Infusions: . diltiazem (CARDIZEM) infusion 20 mg/hr  (08/04/15 1109)   PRN Meds:.feeding supplement, oxyCODONE-acetaminophen **AND** oxyCODONE   Imaging: Imaging results have been reviewed  Cardiac Studies:  Echo May 2016 Study Conclusions  - Left ventricle: The cavity size was normal. Systolic function was  moderately to severely reduced. The estimated ejection fraction  was in the range of 30% to 35%. Diffuse hypokinesis. - Aortic valve: There was mild regurgitation. - Right ventricle: The cavity size was mildly dilated. Wall  thickness was normal. Systolic function was moderately reduced. - Right atrium: The atrium was mildly dilated.   Assessment/Plan:   Principal Problem:   CAP (community acquired pneumonia) Active Problems:   Essential hypertension   Cardiac pacemaker in situ   COPD (chronic obstructive pulmonary disease) ([place])   G E R D   Long-term (current) use of anticoagulants   H/O Arterial embolism of leg    Depression   Chronic kidney disease (CKD), stage III (moderate)   Chronic combined systolic and diastolic congestive heart failure (HCC)   Non-ischemic cardiomyopathy (HCC)   Longstanding persistent atrial fibrillation (HCC)   Protein-calorie malnutrition, severe (HCC)   Chronic pain   Acute-on-chronic kidney injury ([place])   Atrial fibrillation with RVR (HCC)   Acute  systolic congestive heart failure (HCC)   Normal coronary arteries-2003   Demand ischemia (HCC)   Hyperkalemia   Intestinal obstruction (HCC)   Ileus (HCC)  A/P  1. Atrial fib:   He is on high dose Dilt and low dose metoprolol .   His digoxin was not converted to IV I suspect his HR is high due to his ilius and pneumonia He has developed thyroiditis with amiodarone . Is not a candidate for tikosyn due to CKD.  HR is better   2. HTN:   He is not on a diuretic.    Could consider IV lasix .   Will leave that up to the medical service.   He is stable at present   No further cardiology recs Call for questions  Will sign  off   Nahser, Wonda Cheng, MD  08/04/2015 1:03 PM    [facility] [address],  [place], [place]  [address] Pager [phone] Phone: [phone]; Fax: [phone]   [facility]  [address] [place], [place]  [address] [phone]   Fax [phone]

## 2015-08-04 NOTE — Progress Notes (Signed)
Dr. Curtis Sites this am, and updated patient about refusal of blood work, per patient blodd was drawn already. Will follow up with lab. Refused his prednisone po this am.

## 2015-08-04 NOTE — Progress Notes (Signed)
Subjective: Patient awake this morning, in no distress. He says his stomach is only bothering him because there is no food in it. We  Objective: Vital signs in last 24 hours: Filed Vitals:   08/04/15 0735 08/04/15 0800 08/04/15 0900 08/04/15 1000  BP:  115/74 128/76 122/80  Pulse:  56 49   Temp:      TempSrc:      Resp:  15 15 15   Height:      Weight:      SpO2: 99% 97% 99%    Weight change: -42 lb 5.3 oz (-19.2 kg)  Intake/Output Summary (Last 24 hours) at 08/04/15 1209 Last data filed at 08/04/15 0900  Gross per 24 hour  Intake   1175 ml  Output   4675 ml  Net  -3500 ml   General: resting in bed, no acute distress Cardiac: irregularly irregular, rate between 90-120 Pulm: improved breath sounds, coarse sounds bilaterally but no appreciable wheezing Abd: less distension, still tense, present bowel sounds, nontender to palpation Ext: warm and well perfused   Assessment/Plan: Principal Problem:   CAP (community acquired pneumonia) Active Problems:   Essential hypertension   Cardiac pacemaker in situ   COPD (chronic obstructive pulmonary disease) ([place])   G E R D   Long-term (current) use of anticoagulants   H/O Arterial embolism of leg    Depression   Chronic kidney disease (CKD), stage III (moderate)   Chronic combined systolic and diastolic congestive heart failure (HCC)   Non-ischemic cardiomyopathy (HCC)   Longstanding persistent atrial fibrillation (HCC)   Protein-calorie malnutrition, severe (HCC)   Chronic pain   Acute-on-chronic kidney injury (HCC)   Atrial fibrillation with RVR (HCC)   Acute systolic congestive heart failure (HCC)   Normal coronary arteries-2003   Demand ischemia (HCC)   Hyperkalemia   Intestinal obstruction (HCC)   Ileus (HCC)  A. Fib with RVR: CHADSVASc of 6 with 9.8% yearly risk of stroke. Rate is under better control on 20 mg/hr Diltiazem gtt and IV Metoprolol. Rate also notably improved after NG tube placed for ileus.    -Cardiology following -Hold Eliquis 5 mg bid in setting of elevated SCr -Lovenox per pharmacy for anticoagulation -Continue Diltiazem ggt, titrate down as tolerated -IV Metoprolol 10 mg q6h -IV Digoxin 0.0625 mg qd   COPD Exacerbation: No appreciable wheezing on lung exam today. Slight coarse breath sounds, otherwise improved. -Xopenex q4h prn -Switch Prednisone 40 mg to IV Solumedrol to complete 10 day course (3/11>>3/20) -Pulmicort nebulizer BID -O2 assessment prior to discharge -Monitor respiratory status  Ileus: Patient reports improvement in abdominal pain after NG tube placed for decompression. KUB this morning shows enteric tube tip in the distal stomach and continued gaseous distension of small and large bowels with minimal improvement. Abdomen is less distended compared to yesterday. Will need to consider parenteral nutrition if NG tube in place for prolonged period of time. Still unclear why patient developed ileus, possibly from chronic pain meds. Had over 4L output from NG tube yesterday. -NG tube in place -Hold opioids -NPO, d/c oral meds and transition others to IV formulation  Acute on CKD: Patient with CKD 3 thought secondary to cardiorenal syndrome. His baseline SCr seems to be around 1.70-2.00. He was seen by Nephrology 4 weeks ago and at that time not considered a great candidate for dialysis if renal disease progresses. SCr is downtrending from 2.66>>2.54>>2.41. K is also improving after Kayexelate and calcium given yesterday, down to 5.4 from 6.0.  -  f/u BMP today   Systolic CHF: EF was 99991111 on 5//2016 ECHO.  -Daily weights -Strict I&Os -Increase Metoprolol to 50 mg BID -Hydralazine 10 mg TID -Imdur 15 mg qd  Community Acquired Pneumonia: Repeat CXR with some improvement compared to prior. Completed treatment of CAP with 5 total days Azithromycin.  -f/u Sputum culture >> few staph aureus, positive MRSA Nares, likely from colonization.    Chronic Pain:  On  Percocet 10-325 mg q4hr at home prn for chronic hip and low back pain, bilateral hip replacements, and OA of multiple joints. -Hold opioids in setting of ileus   Dispo: Disposition is deferred at this time, awaiting improvement of current medical problems.  Anticipated discharge in approximately 2-3 day(s).        LOS: 7 days   Zada Finders, MD 08/04/2015, 12:09 PM

## 2015-08-05 LAB — BASIC METABOLIC PANEL
ANION GAP: 13 (ref 5–15)
BUN: 64 mg/dL — ABNORMAL HIGH (ref 6–20)
CALCIUM: 9.1 mg/dL (ref 8.9–10.3)
CO2: 22 mmol/L (ref 22–32)
CREATININE: 1.97 mg/dL — AB (ref 0.61–1.24)
Chloride: 112 mmol/L — ABNORMAL HIGH (ref 101–111)
GFR calc non Af Amer: 32 mL/min — ABNORMAL LOW (ref >60)
GFR, EST AFRICAN AMERICAN: 38 mL/min — AB (ref >60)
Glucose, Bld: 115 mg/dL — ABNORMAL HIGH (ref 65–99)
Potassium: 4.9 mmol/L (ref 3.5–5.1)
SODIUM: 147 mmol/L — AB (ref 135–145)

## 2015-08-05 MED ORDER — LORAZEPAM 2 MG/ML IJ SOLN
0.5000 mg | Freq: Once | INTRAMUSCULAR | Status: AC
  Administered 2015-08-05: 0.5 mg via INTRAVENOUS
  Filled 2015-08-05: qty 1

## 2015-08-05 MED ORDER — GABAPENTIN 300 MG PO CAPS
300.0000 mg | ORAL_CAPSULE | Freq: Three times a day (TID) | ORAL | Status: DC
  Administered 2015-08-05 – 2015-08-08 (×10): 300 mg via ORAL
  Filled 2015-08-05 (×10): qty 1

## 2015-08-05 MED ORDER — PANTOPRAZOLE SODIUM 40 MG PO TBEC
40.0000 mg | DELAYED_RELEASE_TABLET | Freq: Every day | ORAL | Status: DC
  Administered 2015-08-06 – 2015-08-08 (×3): 40 mg via ORAL
  Filled 2015-08-05 (×3): qty 1

## 2015-08-05 MED ORDER — DIGOXIN 125 MCG PO TABS
0.0625 mg | ORAL_TABLET | Freq: Every day | ORAL | Status: DC
  Administered 2015-08-06 – 2015-08-08 (×3): 0.0625 mg via ORAL
  Filled 2015-08-05 (×3): qty 1

## 2015-08-05 MED ORDER — BISACODYL 10 MG RE SUPP: 10.0000 mg | Freq: Every day | RECTAL | Status: DC | PRN

## 2015-08-05 MED ORDER — MIRTAZAPINE 7.5 MG PO TABS
15.0000 mg | ORAL_TABLET | Freq: Every day | ORAL | Status: DC
  Administered 2015-08-05 – 2015-08-07 (×3): 15 mg via ORAL
  Filled 2015-08-05 (×2): qty 1
  Filled 2015-08-05: qty 2

## 2015-08-05 MED ORDER — PREDNISONE 20 MG PO TABS
40.0000 mg | ORAL_TABLET | Freq: Every day | ORAL | Status: AC
  Administered 2015-08-06 – 2015-08-07 (×2): 40 mg via ORAL
  Filled 2015-08-05 (×2): qty 2

## 2015-08-05 MED ORDER — BUPROPION HCL 75 MG PO TABS
75.0000 mg | ORAL_TABLET | Freq: Two times a day (BID) | ORAL | Status: DC
  Administered 2015-08-05 – 2015-08-08 (×6): 75 mg via ORAL
  Filled 2015-08-05 (×11): qty 1

## 2015-08-05 MED ORDER — APIXABAN 5 MG PO TABS
5.0000 mg | ORAL_TABLET | Freq: Two times a day (BID) | ORAL | Status: DC
  Administered 2015-08-05 – 2015-08-07 (×5): 5 mg via ORAL
  Filled 2015-08-05 (×5): qty 1

## 2015-08-05 MED ORDER — ROSUVASTATIN CALCIUM 10 MG PO TABS
20.0000 mg | ORAL_TABLET | Freq: Every day | ORAL | Status: DC
  Administered 2015-08-05 – 2015-08-08 (×4): 20 mg via ORAL
  Filled 2015-08-05: qty 1
  Filled 2015-08-05: qty 2
  Filled 2015-08-05: qty 1
  Filled 2015-08-05: qty 2

## 2015-08-05 MED ORDER — METOPROLOL TARTRATE 1 MG/ML IV SOLN
15.0000 mg | INTRAVENOUS | Status: DC
Start: 2015-08-05 — End: 2015-08-06
  Administered 2015-08-05 – 2015-08-06 (×6): 15 mg via INTRAVENOUS
  Filled 2015-08-05 (×6): qty 15

## 2015-08-05 MED ORDER — MORPHINE SULFATE (PF) 2 MG/ML IV SOLN
1.0000 mg | Freq: Once | INTRAVENOUS | Status: AC
  Administered 2015-08-05: 1 mg via INTRAVENOUS
  Filled 2015-08-05: qty 1

## 2015-08-05 MED ORDER — MORPHINE SULFATE (PF) 2 MG/ML IV SOLN
1.0000 mg | INTRAVENOUS | Status: DC | PRN
Start: 2015-08-05 — End: 2015-08-05
  Filled 2015-08-05: qty 1

## 2015-08-05 NOTE — Progress Notes (Signed)
Subjective: Patient states the NG tube is itching, otherwise he is doing better this morning. He says his breathing is near baseline. He reports passing gas and having a small bowel movement yesterday. He was given clear liquids last night and states that he was able to tolerate it well. He feels his abdominal distension is improving.   Objective: Vital signs in last 24 hours: Filed Vitals:   08/05/15 0000 08/05/15 0314 08/05/15 0400 08/05/15 0747  BP: 122/77  125/64 131/82  Pulse: 71  50 101  Temp: 98.6 F (37 C)  97.7 F (36.5 C) 98.2 F (36.8 C)  TempSrc: Oral  Oral Oral  Resp: 19  13 18   Height:      Weight:   140 lb 3.4 oz (63.6 kg)   SpO2: 97% 98% 98% 99%   Weight change: -3.5 oz (-0.1 kg)  Intake/Output Summary (Last 24 hours) at 08/05/15 1045 Last data filed at 08/05/15 0700  Gross per 24 hour  Intake 345.75 ml  Output   1475 ml  Net -1129.25 ml   General: resting in bed, no acute distress, NG tube in place Cardiac: irregularly irregular, rate between 90-110 Pulm: coarse sounds bilaterally but no appreciable wheezing Abd: less distension, still tense, hypoactive bowel sounds, nontender to palpation Ext: warm and well perfused, no pitting edema   Assessment/Plan: Principal Problem:   CAP (community acquired pneumonia) Active Problems:   Essential hypertension   Cardiac pacemaker in situ   COPD (chronic obstructive pulmonary disease) (HCC)   G E R D   Long-term (current) use of anticoagulants   H/O Arterial embolism of leg    Depression   Chronic kidney disease (CKD), stage III (moderate)   Chronic combined systolic and diastolic congestive heart failure (HCC)   Non-ischemic cardiomyopathy (HCC)   Longstanding persistent atrial fibrillation (HCC)   Protein-calorie malnutrition, severe (HCC)   Chronic pain   Acute-on-chronic kidney injury (HCC)   Atrial fibrillation with RVR (HCC)   Acute systolic congestive heart failure (HCC)   Normal coronary  arteries-2003   Demand ischemia (HCC)   Hyperkalemia   Intestinal obstruction (HCC)   Ileus (HCC)  A. Fib with RVR: CHADSVASc of 6 with 9.8% yearly risk of stroke. Rate is under better control. On 15 mg/hr Diltiazem gtt and IV Metoprolol. Rate also notably improved after NG tube placed for ileus. Will increase Metoprolol and try to titrate off Diltiazem gtt. -Hold Eliquis 5 mg bid while NG tube in place -Lovenox per pharmacy for anticoagulation -Titrate down Diltiazem ggt as tolerated -Increase IV Metoprolol to 15 mg q4h -IV Digoxin 0.0625 mg qd   COPD Exacerbation: No appreciable wheezing on lung exam today. Slight coarse breath sounds, otherwise improved. -Xopenex q4h prn -IV Solumedrol to complete 10 day steroid course (3/11>>3/20) >>switch to PO pred 40 mg when NG tube out -Pulmicort nebulizer BID -O2 assessment prior to discharge -Monitor respiratory status  Ileus: Abdominal distension improving. Had total of 200 mL output yesterday and another 350 mL this morning (after clears given). Patient reports small bowel movement and flatulence yesterday.  -Clamp NG tube >> possible removal later today -NPO  Acute on CKD: Patient with CKD 3 thought secondary to cardiorenal syndrome. His baseline SCr seems to be around 1.70-2.00. He was seen by Nephrology 4 weeks ago and at that time not considered a great candidate for dialysis if renal disease progresses. SCr is downtrending from 2.66>>2.54>>2.41>>1.97. Potassium has improved from 5.4 to 4.9 today.  -BMP in AM -  d/c foley   Systolic CHF: EF was 99991111 on 5//2016 ECHO.  -Daily weights -Strict I&Os -Increase Metoprolol to 50 mg BID   Community Acquired Pneumonia: Completed treatment of CAP with 5 total days Azithromycin.  -f/u Sputum culture >> few staph aureus, positive MRSA Nares, likely from colonization.    Chronic Pain:  On Percocet 10-325 mg q4hr at home prn for chronic hip and low back pain, bilateral hip replacements, and  OA of multiple joints. -Hold opioids in setting of ileus   Dispo: Disposition is deferred at this time, awaiting improvement of current medical problems.  Anticipated discharge in approximately 2-3 day(s).        LOS: 8 days   Zada Finders, MD 08/05/2015, 10:45 AM

## 2015-08-05 NOTE — Progress Notes (Signed)
Patient stable at this time with no need for BiPAP. RT will continue to monitor.

## 2015-08-05 NOTE — Progress Notes (Signed)
PT Cancellation Note  Patient Details Name: [name] MRN: [id number] DOB: 04-02-1946   Cancelled Treatment:    Reason Eval/Treat Not Completed: Patient declined, no reason specified;Fatigue/lethargy limiting ability to participate.  Attempted to see patient x2 today.  Patient declined PT on both occasions reporting "I just got the tube out" and "I'm going to take this slow. I'll get up tomorrow"  Will return tomorrow for PT evaluation.   [name] 08/05/2015, 9:03 PM [name]. [name], [place] Pager [phone]

## 2015-08-05 NOTE — Plan of Care (Signed)
Problem: Phase I Progression Outcomes Goal: Progress activity as tolerated unless otherwise ordered Outcome: Not Progressing Patient refuses to get out of bed

## 2015-08-06 LAB — BASIC METABOLIC PANEL
Anion gap: 8 (ref 5–15)
Anion gap: 12 (ref 5–15)
BUN: 74 mg/dL — AB (ref 6–20)
BUN: 72 mg/dL — AB (ref 6–20)
CHLORIDE: 115 mmol/L — AB (ref 101–111)
CHLORIDE: 110 mmol/L (ref 101–111)
CO2: 20 mmol/L — AB (ref 22–32)
CO2: 24 mmol/L (ref 22–32)
CREATININE: 2.46 mg/dL — AB (ref 0.61–1.24)
CREATININE: 2.19 mg/dL — AB (ref 0.61–1.24)
Calcium: 8.8 mg/dL — ABNORMAL LOW (ref 8.9–10.3)
Calcium: 8.6 mg/dL — ABNORMAL LOW (ref 8.9–10.3)
GFR calc Af Amer: 29 mL/min — ABNORMAL LOW (ref >60)
GFR calc Af Amer: 33 mL/min — ABNORMAL LOW (ref >60)
GFR calc non Af Amer: 25 mL/min — ABNORMAL LOW (ref >60)
GFR calc non Af Amer: 29 mL/min — ABNORMAL LOW (ref >60)
Glucose, Bld: 146 mg/dL — ABNORMAL HIGH (ref 65–99)
Glucose, Bld: 136 mg/dL — ABNORMAL HIGH (ref 65–99)
Potassium: 5.8 mmol/L — ABNORMAL HIGH (ref 3.5–5.1)
Potassium: 4.9 mmol/L (ref 3.5–5.1)
SODIUM: 143 mmol/L (ref 135–145)
Sodium: 146 mmol/L — ABNORMAL HIGH (ref 135–145)

## 2015-08-06 LAB — URINE CULTURE: Culture: NO GROWTH

## 2015-08-06 MED ORDER — METOPROLOL SUCCINATE ER 50 MG PO TB24
100.0000 mg | ORAL_TABLET | Freq: Every day | ORAL | Status: DC
  Administered 2015-08-06: 100 mg via ORAL
  Filled 2015-08-06: qty 2

## 2015-08-06 MED ORDER — POLYETHYLENE GLYCOL 3350 17 G PO PACK
17.0000 g | PACK | Freq: Every day | ORAL | Status: DC
  Administered 2015-08-06 – 2015-08-08 (×3): 17 g via ORAL
  Filled 2015-08-06 (×3): qty 1

## 2015-08-06 MED ORDER — SODIUM POLYSTYRENE SULFONATE 15 GM/60ML PO SUSP
30.0000 g | Freq: Once | ORAL | Status: AC
  Administered 2015-08-06: 30 g via RECTAL
  Filled 2015-08-06: qty 120

## 2015-08-06 NOTE — Evaluation (Signed)
Physical Therapy Evaluation Patient Details Name: [name] MRN: [id number] DOB: [birth year] Today's Date: 08/06/2015   History of Present Illness  70 year old male with a past medical history of chronic systolic CHF with nonischemic DCM -EF 30-35%. He had normal coronary arteries by cath in 2003 and low risk Myoview Feb 2015. Echo May 2016- EF 30-35%. There is also a history of ETOH abuse, COPD, CKD III, and CAF with SSS. He is s/p St Jude PTVDP '04-gen change 2010. ON arrival in ED he was found to be in AF w RVR. CXR showed bilateral effusions and ? Atelectasis vs PNA in RLL.     Clinical Impression  Pt admitted with above diagnosis. Pt currently with functional limitations due to the deficits listed below (see PT Problem List). [name] declines ambulating this session but agreed to participate w/ therapy and required min assist to pivot to chair.  Pt is alone at home majority of the time and remains in bed most of the day.  He will benefit from SNF at d/c to increase his independence and safety with mobility.      Follow Up Recommendations SNF;Supervision for mobility/OOB    Equipment Recommendations  None recommended by PT    Recommendations for Other Services       Precautions / Restrictions Precautions Precautions: Fall;ICD/Pacemaker Restrictions Weight Bearing Restrictions: No      Mobility  Bed Mobility Overal bed mobility: Needs Assistance Bed Mobility: Supine to Sit     Supine to sit: Min guard     General bed mobility comments: HOB elevated and pt uses bed rail.  Transfers Overall transfer level: Needs assistance Equipment used: Rolling walker (2 wheeled) Transfers: Sit to/from [facility] Sit to Stand: Min assist Stand pivot transfers: Min assist       General transfer comment: Pt slow to stand, cues provided for proper hand placement.  C/o dizziness upon standing (BP 140's/100).  After sitting EOB for another few minutes pt perform  stand and pivot to recliner chair w/o c/o dizziness.  HR up to 130's w/ stand pivot transfer.  Ambulation/Gait             General Gait Details: pt declined  Financial trader Rankin (Stroke Patients Only)       Balance Overall balance assessment: Needs assistance Sitting-balance support: Feet supported Sitting balance-Leahy Scale: Fair     Standing balance support: Bilateral upper extremity supported;During functional activity Standing balance-Leahy Scale: Poor Standing balance comment: Relies on UE support                             Pertinent Vitals/Pain Pain Assessment: 0-10 Pain Score: 8  Faces Pain Scale: Hurts a little bit Pain Location: hips Pain Descriptors / Indicators: Aching Pain Intervention(s): Limited activity within patient's tolerance;Monitored during session;Repositioned    Home Living Family/patient expects to be discharged to:: Private residence Living Arrangements: Children Available Help at Discharge: Family;Available PRN/intermittently Type of Home: House Home Access: Level entry     Home Layout: One level Home Equipment: Walker - 2 wheels;Cane - single point;Bedside commode Additional Comments: Lives w/ son who is a Pharmacist, community and is rarely home.  Per RN: son reports that pt mostly remains in bed and only gets up when he needs to have a BM, otherwise uses urinal.    Prior Function  Level of Independence: Independent with assistive device(s)         Comments: Lives w/ son who is a Pharmacist, community and is rarely home.  Per RN: son reports that pt mostly remains in bed and only gets up when he needs to have a BM, otherwise uses urinal.  However, pt reports that he ambulates using a cane and still drives to the grocery, taking breaks to rest prn.  He says he uses his BSC as a shower chair and showers w/o assist. Dresses w/o assist.     Hand Dominance   Dominant Hand: Right    Extremity/Trunk  Assessment   Upper Extremity Assessment: Defer to OT evaluation           Lower Extremity Assessment: Generalized weakness         Communication   Communication: No difficulties  Cognition Arousal/Alertness: Awake/alert Behavior During Therapy: WFL for tasks assessed/performed Overall Cognitive Status: Within Functional Limits for tasks assessed                      General Comments      Exercises        Assessment/Plan    PT Assessment Patient needs continued PT services  PT Diagnosis Difficulty walking;Generalized weakness;Acute pain   PT Problem List Decreased strength;Decreased activity tolerance;Decreased balance;Decreased mobility;Decreased knowledge of use of DME;Decreased safety awareness;Cardiopulmonary status limiting activity;Pain  PT Treatment Interventions DME instruction;Gait training;Functional mobility training;Therapeutic activities;Therapeutic exercise;Balance training;Patient/family education   PT Goals (Current goals can be found in the Care Plan section) Acute Rehab PT Goals Patient Stated Goal: to feel better PT Goal Formulation: With patient Time For Goal Achievement: 08/20/15 Potential to Achieve Goals: Good    Frequency Min 3X/week   Barriers to discharge Decreased caregiver support is alone at home majority of the time    Co-evaluation               End of Session Equipment Utilized During Treatment: Gait belt;Oxygen Activity Tolerance: Patient limited by fatigue Patient left: in chair;with call bell/phone within reach;with chair alarm set Nurse Communication: Mobility status;Other (comment) (HR)         Time: 1010-1032 PT Time Calculation (min) (ACUTE ONLY): 22 min   Charges:   PT Evaluation $PT Eval Moderate Complexity: 1 Procedure     PT G Codes:       Collie Siad PT, DPT  Pager: [phone] Phone: [phone] 08/06/2015, 10:48 AM

## 2015-08-06 NOTE — Progress Notes (Signed)
Subjective: Patient says he is doing well this morning. He is tolerating oral intake and reports being able to pass gas. He says his last bowel movement was the day before yesterday. He feels his breathing is improving and getting closer to his baseline. NG tube was removed yesterday.  Objective: Vital signs in last 24 hours: Filed Vitals:   08/06/15 0900 08/06/15 1100 08/06/15 1200 08/06/15 1300  BP: 116/87 116/77 121/74 114/100  Pulse: 76 52 119 90  Temp:   98.5 F (36.9 C)   TempSrc:   Oral   Resp: 12 15 14 24   Height:      Weight:      SpO2: 98% 98% 99% 94%   Weight change: 16 lb 8.6 oz (7.5 kg)  Intake/Output Summary (Last 24 hours) at 08/06/15 1409 Last data filed at 08/06/15 1300  Gross per 24 hour  Intake   1419 ml  Output   1025 ml  Net    394 ml   General: resting in bed, no acute distress Cardiac: irregularly irregular, rate between 90-110 Pulm: slight rhonchi bilaterally, no wheezing, improved from yesterday Abd: hypoactive bowel sounds, nontender to palpation, less distension, soft Ext: warm and well perfused, no pitting edema   Assessment/Plan: Principal Problem:   CAP (community acquired pneumonia) Active Problems:   Essential hypertension   Cardiac pacemaker in situ   COPD (chronic obstructive pulmonary disease) (HCC)   G E R D   Long-term (current) use of anticoagulants   H/O Arterial embolism of leg    Depression   Chronic kidney disease (CKD), stage III (moderate)   Chronic combined systolic and diastolic congestive heart failure (HCC)   Non-ischemic cardiomyopathy (HCC)   Longstanding persistent atrial fibrillation (HCC)   Protein-calorie malnutrition, severe (HCC)   Chronic pain   Acute-on-chronic kidney injury (HCC)   Atrial fibrillation with RVR (HCC)   Acute systolic congestive heart failure (HCC)   Normal coronary arteries-2003   Demand ischemia (HCC)   Hyperkalemia   Intestinal obstruction (HCC)   Ileus (HCC)  A. Fib with RVR:  CHADSVASc of 6 with 9.8% yearly risk of stroke. Rate is under better control. On 7.5 mg/hr Diltiazem gtt. Transitioned to oral Metoprolol. Rate also notably improved after NG tube placed for ileus. Will try to titrate off Diltiazem gtt and move to regular floor. -Eliquis 5 mg bid while NG tube in place -Titrate down Diltiazem ggt as tolerated -Transfer to telemetry when off Diltiazem drip -Switch IV Metoprolol to po 100 mg qd 24h tablet -Digoxin 0.0625 po mg qd   COPD Exacerbation: No appreciable wheezing on lung exam today. Slight coarse breath sounds, otherwise improved. -Xopenex q4h prn -Prednisone 40 mg po to complete 10 day steroid course (3/11>>3/20)  -Pulmicort nebulizer BID -O2 assessment prior to discharge -Monitor respiratory status -PT recommending SNF  Ileus: Abdominal distension improving. NG tube removed on 3/18.  Patient tolerating oral intake, passing gas. Has not had a bowel movement since day before yesterday. -Heart diet -Miralax -Dulcolax prn  Acute on CKD: Patient with CKD 3 thought secondary to cardiorenal syndrome. His baseline SCr seems to be around 1.70-2.00. He was seen by Nephrology 4 weeks ago and at that time not considered a great candidate for dialysis if renal disease progresses. SCr bumped up from 1.97 to 2.46 this morning. Potassium elevated at 5.8.  -f/u BMP -Kayexalate per rectum once   Systolic CHF: EF was 99991111 on 5//2016 ECHO.  -Daily weights -Strict I&Os -Metoprolol succinate 100  mg qd   Community Acquired Pneumonia: Completed treatment of CAP with 5 total days Azithromycin.  -f/u Sputum culture >> few staph aureus, positive MRSA Nares, likely from colonization.    Chronic Pain:  On Percocet 10-325 mg q4hr at home prn for chronic hip and low back pain, bilateral hip replacements, and OA of multiple joints.   Dispo: Disposition is deferred at this time, awaiting improvement of current medical problems.  Anticipated discharge in  approximately 2-3 day(s).        LOS: 9 days   Zada Finders, MD 08/06/2015, 2:09 PM

## 2015-08-06 NOTE — Progress Notes (Signed)
Patient ID: GARRY DIETL, male   DOB: 1945-09-06, 70 y.o.   MRN: [id number] Medicine attending: I personally supervised the examination of this patient with resident physician [name] and I concur with his evaluation and management plan which we discussed together. He is now making steady progress. Ventricular rate of his atrial fibrillation coming under control. Parenteral diltiazem being tapered. Currently down to 7.5 mg per hour. Ileus has resolved. He is tolerating solid food by mouth. Parenteral beta blocker transitioned to oral agent.  He has completed a planned course of antibiotics for community acquired pneumonia. Some fluctuation in his renal function but overall back to his approximate baseline. We should be able to move him to a regular medical bed today once we get him off the Cardizem.

## 2015-08-07 ENCOUNTER — Other Ambulatory Visit: Payer: Self-pay | Admitting: *Deleted

## 2015-08-07 LAB — CBC
HCT: 45.2 % (ref 39.0–52.0)
HEMOGLOBIN: 14 g/dL (ref 13.0–17.0)
MCH: 29.4 pg (ref 26.0–34.0)
MCHC: 31 g/dL (ref 30.0–36.0)
MCV: 95 fL (ref 78.0–100.0)
Platelets: 235 10*3/uL (ref 150–400)
RBC: 4.76 MIL/uL (ref 4.22–5.81)
RDW: 15.4 % (ref 11.5–15.5)
WBC: 15.8 10*3/uL — ABNORMAL HIGH (ref 4.0–10.5)

## 2015-08-07 LAB — BASIC METABOLIC PANEL
Anion gap: 9 (ref 5–15)
BUN: 70 mg/dL — AB (ref 6–20)
CHLORIDE: 116 mmol/L — AB (ref 101–111)
CO2: 22 mmol/L (ref 22–32)
Calcium: 8.7 mg/dL — ABNORMAL LOW (ref 8.9–10.3)
Creatinine, Ser: 2.39 mg/dL — ABNORMAL HIGH (ref 0.61–1.24)
GFR calc Af Amer: 30 mL/min — ABNORMAL LOW (ref >60)
GFR calc non Af Amer: 26 mL/min — ABNORMAL LOW (ref >60)
GLUCOSE: 178 mg/dL — AB (ref 65–99)
POTASSIUM: 5.3 mmol/L — AB (ref 3.5–5.1)
Sodium: 147 mmol/L — ABNORMAL HIGH (ref 135–145)

## 2015-08-07 MED ORDER — METOPROLOL TARTRATE 1 MG/ML IV SOLN
5.0000 mg | Freq: Four times a day (QID) | INTRAVENOUS | Status: DC | PRN
  Administered 2015-08-07: 5 mg via INTRAVENOUS
  Filled 2015-08-07: qty 5

## 2015-08-07 MED ORDER — PREDNISONE 20 MG PO TABS
20.0000 mg | ORAL_TABLET | Freq: Every day | ORAL | Status: AC
  Administered 2015-08-08: 20 mg via ORAL
  Filled 2015-08-07: qty 1

## 2015-08-07 MED ORDER — METOPROLOL SUCCINATE ER 100 MG PO TB24
200.0000 mg | ORAL_TABLET | Freq: Every day | ORAL | Status: DC
  Administered 2015-08-07 – 2015-08-08 (×2): 200 mg via ORAL
  Filled 2015-08-07: qty 2
  Filled 2015-08-07: qty 4

## 2015-08-07 MED ORDER — LEVALBUTEROL HCL 0.63 MG/3ML IN NEBU
0.6300 mg | INHALATION_SOLUTION | Freq: Three times a day (TID) | RESPIRATORY_TRACT | Status: DC
  Administered 2015-08-08 (×2): 0.63 mg via RESPIRATORY_TRACT
  Filled 2015-08-07 (×2): qty 3

## 2015-08-07 MED ORDER — METOPROLOL SUCCINATE ER 50 MG PO TB24: 200.0000 mg | ORAL_TABLET | Freq: Every day | ORAL | Status: DC

## 2015-08-07 MED ORDER — APIXABAN 2.5 MG PO TABS
2.5000 mg | ORAL_TABLET | Freq: Two times a day (BID) | ORAL | Status: DC
  Administered 2015-08-07 – 2015-08-08 (×2): 2.5 mg via ORAL
  Filled 2015-08-07 (×2): qty 1

## 2015-08-07 MED ORDER — PREDNISONE 10 MG PO TABS: 10.0000 mg | ORAL_TABLET | Freq: Every day | ORAL | Status: DC

## 2015-08-07 MED ORDER — METOPROLOL TARTRATE 1 MG/ML IV SOLN: 5.0000 mg | INTRAVENOUS | Status: DC | PRN

## 2015-08-07 NOTE — Progress Notes (Signed)
Patient ID: [name], male   DOB: Oct 29, 1945, 70 y.o.   MRN: [id number] Medicine attending: Management discussed with the patient spent side with the resident team and I concur with the evaluation and management as recorded by [name]. Heart rate fluctuating and Cardizem infusion was increased by nursing staff up to an milligrams per hour again. We are titrating his beta blocker now on Toprol-XL 200 mg daily. Goal is to get him off the IV Cardizem today then transferred to regular medical bed. Abdomen mildly distended but he is tolerating a by mouth diet. He is back on his oral medications including digoxin. White blood count remains elevated due to steroid effect.

## 2015-08-07 NOTE — Clinical Social Work Note (Signed)
Clinical Social Work Assessment  Patient Details  Name: [name] MRN: [id number] Date of Birth: December 13, 1945  Date of referral:  08/07/15               Reason for consult:  Facility Placement                Permission sought to share information with:  Case Manager, Family Supports Permission granted to share information::  Yes, Verbal Permission Granted  Name::     Sonya   Agency::     Relationship::  Daughter  Contact Information:  [phone]  Housing/Transportation Living arrangements for the past 2 months:  Mobile Home Source of Information:  Patient Patient Interpreter Needed:  None Criminal Activity/Legal Involvement Pertinent to Current Situation/Hospitalization:  No - Comment as needed Significant Relationships:  Adult Children Lives with:  Self Do you feel safe going back to the place where you live?  Yes Need for family participation in patient care:  Yes (Comment)  Care giving concerns:  None reported at this time.    Social Worker assessment / plan:  CSW received consult by MD. CSW went to speak with patient regarding the possible need for short term rehab. CSW introduced self and acknowledged the patient. Patient is alert and orientedx4. Patient was calm and cooperative with CSW assessment. No family was at bedside. CSW informed patient of PT recommendation for short term rehab. Patient is agreeable to SNF placement and provided CSW with permission to fax clinical information out to the facilities in [facility]. No preferences were reported at this time. CSW to complete FL2 for MD signature. CSW to initiate SNF placement process.    Employment status:  Disabled (Comment on whether or not currently receiving Disability) Insurance information:  Managed Medicare PT Recommendations:  [place] / Referral to community resources:     Patient/Family's Response to care:  Patient is agreeable to short term rehab.   Patient/Family's  Understanding of and Emotional Response to Diagnosis, Current Treatment, and Prognosis:  Patient is aware and understanding of current treatment and prognosis. Patient believes he will benefit most from short term rehab before returning home.   Emotional Assessment Appearance:  Appears stated age Attitude/Demeanor/Rapport:   (Calm and Cooperative) Affect (typically observed):  Accepting, Appropriate, Calm Orientation:  Oriented to Self, Oriented to Place, Oriented to  Time, Oriented to Situation Alcohol / Substance use:  Not Applicable Psych involvement (Current and /or in the community):  No (Comment)  Discharge Needs  Concerns to be addressed:  Discharge Planning Concerns Readmission within the last 30 days:  No Current discharge risk:  Physical Impairment Barriers to Discharge:  Barriers Resolved, Continued Medical Work up   [employer], LCSW 08/07/2015, 3:35 PM

## 2015-08-07 NOTE — NC FL2 (Signed)
[place] LEVEL OF CARE SCREENING TOOL     IDENTIFICATION  Patient Name: FENWICK PREVATT Birthdate: 05/14/1946 Sex: male Admission Date (Current Location): 07/28/2015  [facility] and [place] Number:  Herbalist and Address:  [facility] [place], [place] [address], [place], [place] [address]      Provider Number: [id number]  Attending Physician Name and Address:  Annia Belt, MD  Relative Name and Phone Number:       Current Level of Care: Hospital Recommended Level of Care: [place] Prior Approval Number:    Date Approved/Denied:   PASRR Number: [id number]  Discharge Plan: SNF    Current Diagnoses: Patient Active Problem List   Diagnosis Date Noted  . Ileus ([place])   . Hyperkalemia   . Intestinal obstruction ([place])   . Normal coronary arteries-2003 07/31/2015  . Demand ischemia ([place]) 07/31/2015  . Atrial fibrillation with RVR ([name]) 07/29/2015  . Acute systolic congestive heart failure ([place])   . CAP (community acquired pneumonia) 07/28/2015  . Long term current use of opiate analgesic 06/27/2015  . Acute-on-chronic kidney injury ([place]) 06/25/2015  . Decreased appetite 06/19/2015  . Neuropathic pain of both feet ([place]) 05/03/2015  . Heel pain 03/02/2015  . Chronic pain 12/13/2014  . Sepsis secondary to UTI ([place]) 11/12/2014  . Protein-calorie malnutrition, severe ([place]) 10/26/2014  . Longstanding persistent atrial fibrillation ([place]) 10/11/2014  . STEMI (ST elevation myocardial infarction) ([place]) 10/11/2014  . TIA (transient ischemic attack) 10/11/2014  . Unspecified constipation 01/25/2014  . Healthcare maintenance 01/25/2014  . BPH (benign prostatic hyperplasia) 01/20/2014  . PVD (peripheral vascular disease) ([place]) 12/10/2013  . Decreased hearing 09/08/2013  . Rash and nonspecific skin eruption 08/13/2013  . Non-ischemic cardiomyopathy ([place]) 05/17/2013  . Weight loss 03/19/2012  . Insomnia 11/19/2010  .  Chronic combined systolic and diastolic congestive heart failure ([place]) 06/20/2010  . Long-term (current) use of anticoagulants 06/05/2010  . H/O Arterial embolism of leg  06/05/2010  . Depression 01/19/2010  . Chronic kidney disease (CKD), stage III (moderate) 11/18/2009  . History of hyperthyroidism 03/13/2009  . Bilateral hip pain 03/23/2007  . INTERMITTENT CLAUDICATION 10/24/2006  . OSTEOARTHRITIS 09/05/2006  . G E R D 09/04/2006  . HYDROCELE NOS 09/04/2006  . COPD (chronic obstructive pulmonary disease) ([place]) 08/28/2006  . BACK PAIN 06/02/2006  . ERECTILE DYSFUNCTION 03/01/2006  . History of tobacco abuse 03/01/2006  . Essential hypertension 03/01/2006  . Cardiac pacemaker in situ 03/01/2006  . SPONDYLOSIS 03/01/2006    Orientation RESPIRATION BLADDER Height & Weight     Self, Time, Place, Situation  O2 (3 Liters) Indwelling catheter Weight: 158 lb 1.1 oz (71.7 kg) Height:  6\' 3"  (190.5 cm)  BEHAVIORAL SYMPTOMS/MOOD NEUROLOGICAL BOWEL NUTRITION STATUS      Continent Diet  AMBULATORY STATUS COMMUNICATION OF NEEDS Skin   Limited Assist Verbally Normal                       Personal Care Assistance Level of Assistance  Bathing, Feeding, Dressing Bathing Assistance: Limited assistance Feeding assistance: Independent Dressing Assistance: Limited assistance     Functional Limitations Info  Sight, Hearing, Speech Sight Info: Adequate Hearing Info: Adequate Speech Info: Adequate    SPECIAL CARE FACTORS FREQUENCY  PT (By licensed PT)                    Contractures      Additional Factors Info  Code  Status, Allergies Code Status Info: FULL Allergies Info: Amiodarone, Penicillins           Current Medications (08/07/2015):  This is the current hospital active medication list Current Facility-Administered Medications  Medication Dose Route Frequency Provider Last Rate Last Dose  . antiseptic oral rinse (CPC / CETYLPYRIDINIUM CHLORIDE 0.05%) solution 7  mL  7 mL Mouth Rinse BID Truman Hayward, MD   7 mL at 08/06/15 2200  . apixaban (ELIQUIS) tablet 2.5 mg  2.5 mg Oral BID Zada Finders, MD      . bisacodyl (DULCOLAX) suppository 10 mg  10 mg Rectal Daily PRN Milagros Loll, MD      . budesonide (PULMICORT) nebulizer solution 0.25 mg  0.25 mg Nebulization BID Milagros Loll, MD   0.25 mg at 08/07/15 0944  . buPROPion (WELLBUTRIN) tablet 75 mg  75 mg Oral BID Milagros Loll, MD   75 mg at 08/07/15 1017  . digoxin (LANOXIN) tablet 0.0625 mg  0.0625 mg Oral Daily Milagros Loll, MD   0.0625 mg at 08/07/15 1008  . feeding supplement (BOOST / RESOURCE BREEZE) liquid 1 Container  1 Container Oral TID BM Truman Hayward, MD   1 Container at 08/07/15 1012  . feeding supplement (BOOST / RESOURCE BREEZE) liquid 1 Container  1 Container Oral PRN Truman Hayward, MD      . gabapentin (NEURONTIN) capsule 300 mg  300 mg Oral TID Milagros Loll, MD   300 mg at 08/07/15 1008  . ipratropium (ATROVENT) nebulizer solution 0.5 mg  0.5 mg Nebulization Q4H PRN Maryellen Pile, MD   0.5 mg at 08/05/15 1715  . levalbuterol (XOPENEX) nebulizer solution 0.63 mg  0.63 mg Nebulization Q6H Annia Belt, MD   0.63 mg at 08/07/15 1505  . levalbuterol (XOPENEX) nebulizer solution 0.63 mg  0.63 mg Nebulization Q3H PRN Maryellen Pile, MD   0.63 mg at 08/06/15 2018  . metoprolol (LOPRESSOR) injection 5 mg  5 mg Intravenous Q6H PRN Zada Finders, MD      . metoprolol succinate (TOPROL-XL) 24 hr tablet 200 mg  200 mg Oral Daily Milagros Loll, MD   200 mg at 08/07/15 1006  . mirtazapine (REMERON) tablet 15 mg  15 mg Oral QHS Milagros Loll, MD   15 mg at 08/06/15 2048  . oxyCODONE-acetaminophen (PERCOCET/ROXICET) 5-325 MG per tablet 1 tablet  1 tablet Oral Q4H PRN Francesca Oman, DO   1 tablet at 08/07/15 1007   And  . oxyCODONE (Oxy IR/ROXICODONE) immediate release tablet 5 mg  5 mg Oral Q4H PRN Francesca Oman, DO   5 mg at 08/07/15 1007  . pantoprazole  (PROTONIX) EC tablet 40 mg  40 mg Oral Daily Milagros Loll, MD   40 mg at 08/07/15 1008  . polyethylene glycol (MIRALAX / GLYCOLAX) packet 17 g  17 g Oral Daily Milagros Loll, MD   17 g at 08/07/15 1008  . [START ON 08/09/2015] predniSONE (DELTASONE) tablet 10 mg  10 mg Oral Q breakfast Zada Finders, MD      . Derrill Memo ON 08/08/2015] predniSONE (DELTASONE) tablet 20 mg  20 mg Oral Q breakfast Zada Finders, MD      . rosuvastatin (CRESTOR) tablet 20 mg  20 mg Oral q1800 Milagros Loll, MD   20 mg at 08/06/15 1639     Discharge Medications: Please see discharge summary for a list of discharge medications.  Relevant  Imaging Results:  Relevant Lab Results:   Additional Information SS#: [id number]  Raymondo Band, LCSW

## 2015-08-07 NOTE — Progress Notes (Signed)
Cardizem d/c'd per MD order, heart trending back up 140's PRN lopressor given with short term decrease in heart rate. Heart rate back up to 130-140's MD notified, no new orders received will continue to monitor.

## 2015-08-07 NOTE — Care Management Note (Signed)
Case Management Note  Patient Details  Name: BURRIS ALBERS MRN: [id number] Date of Birth: 1945/08/14  Subjective/Objective:     Persistent atrial fibrillation with RVR, ileus, and hyperkalemia                Action/Plan: Discharge Planning: Pt remains on Cardizem gtt. CSW following for SNF placement. Pt lives alone with limited support at home. Pt has oxygen at home. Scheduled dc to SNF when medically stable.   Please see previous NCM notes.    Expected Discharge Date:                  Expected Discharge Plan:  Skilled Nursing Facility  In-House Referral:  Clinical Social Work  Discharge planning Services  CM Consult  Post Acute Care Choice:  NA Choice offered to:  NA  DME Arranged:  N/A DME Agency:  NA  HH Arranged:  NA HH Agency:  NA  Status of Service:  Completed, signed off  Medicare Important Message Given:  Yes Date Medicare IM Given:    Medicare IM give by:    Date Additional Medicare IM Given:    Additional Medicare Important Message give by:     If discussed at Advance of Stay Meetings, dates discussed:    Additional Comments:  Erenest Rasher, RN 08/07/2015, 3:12 PM

## 2015-08-07 NOTE — Progress Notes (Addendum)
Subjective: Patient says he is doing ok this morning. He had a bowel movement yesterday and is passing gas. He says he has some SOB with minimal activity which is relieved with breathing treatments. He is willing to go to a SNF for rehab on discharge.  Objective: Vital signs in last 24 hours: Filed Vitals:   08/07/15 0335 08/07/15 0433 08/07/15 0832 08/07/15 0939  BP:   116/96   Pulse:   143   Temp: 98.6 F (37 C)  97.8 F (36.6 C)   TempSrc: Oral  Oral   Resp:   18   Height:      Weight:  158 lb 1.1 oz (71.7 kg)    SpO2:   98% 97%   Weight change: 1 lb 5.2 oz (0.6 kg)  Intake/Output Summary (Last 24 hours) at 08/07/15 1042 Last data filed at 08/07/15 0600  Gross per 24 hour  Intake 855.63 ml  Output   1000 ml  Net -144.37 ml   General: resting in bed, no acute distress Cardiac: irregularly irregular, tachycardic, rate between 110-120 Pulm: clear to ausculation bilaterally, distant breath sounds Abd: hypoactive bowel sounds, nontender to palpation, less distension, soft Ext: warm and well perfused, no pitting edema   Assessment/Plan: Principal Problem:   CAP (community acquired pneumonia) Active Problems:   Essential hypertension   Cardiac pacemaker in situ   COPD (chronic obstructive pulmonary disease) (HCC)   G E R D   Long-term (current) use of anticoagulants   H/O Arterial embolism of leg    Depression   Chronic kidney disease (CKD), stage III (moderate)   Chronic combined systolic and diastolic congestive heart failure (HCC)   Non-ischemic cardiomyopathy (HCC)   Longstanding persistent atrial fibrillation (HCC)   Protein-calorie malnutrition, severe (HCC)   Chronic pain   Acute-on-chronic kidney injury (HCC)   Atrial fibrillation with RVR (HCC)   Acute systolic congestive heart failure (HCC)   Normal coronary arteries-2003   Demand ischemia (HCC)   Hyperkalemia   Intestinal obstruction (HCC)   Ileus (HCC)  A. Fib with RVR: CHADSVASc of 6 with 9.8%  yearly risk of stroke. Rate is above goal this morning prior to receiving oral Metoprolol. On 10 mg/hr Diltiazem gtt this morning. Will titrate Diltiazem gtt to off and if tolerating will move to regular floor. -Change Eliquis to 2.5 mg bid renal dose -Titrate down Diltiazem ggt -Transfer to telemetry when off Diltiazem drip -Increase Metoprolol po to 200 mg qd 24h tablet -IV Metoprolol 5 mg q6h prn HR >120, hold if SBP <90 -Digoxin 0.0625 po mg qd   COPD Exacerbation: No appreciable wheezing on lung exam today. Steroid course will be completed today. -Xopenex q4h prn -Prednisone 40 mg po to complete 10 day steroid course (3/11>>3/20)  -Pulmicort nebulizer BID -O2 assessment prior to discharge -Monitor respiratory status -Social work consult for SNF  Ileus: Abdominal distension improving. NG tube removed on 3/18.  Patient tolerating oral intake, passing gas. Had a bowel movement yesterday. -Heart diet -Miralax -Dulcolax prn  Acute on CKD: Patient with CKD 3 thought secondary to cardiorenal syndrome. His baseline SCr seems to be around 1.70-2.00. He was seen by Nephrology 4 weeks ago and at that time not considered a great candidate for dialysis if renal disease progresses. SCr 2.39 this morning. Potassium slightly elevated at 5.3.  -f/u BMP   Systolic CHF: EF was 99991111 on 5//2016 ECHO.  -Daily weights -Strict I&Os -Metoprolol succinate 200 mg qd   Community Acquired Pneumonia:  Completed treatment of CAP with 5 total days Azithromycin.  -f/u Sputum culture >> few staph aureus, positive MRSA Nares, likely from colonization.    Chronic Pain:  On Percocet 10-325 mg q4hr at home prn for chronic hip and low back pain, bilateral hip replacements, and OA of multiple joints.   Dispo: Disposition is deferred at this time, awaiting improvement of current medical problems.  Anticipated discharge in approximately 1-2 day(s).        LOS: 10 days   Zada Finders, MD 08/07/2015, 10:42  AM

## 2015-08-07 NOTE — Patient Outreach (Signed)
[facility]) Care Management  08/07/2015  MAKOY WYER 28-Dec-1945 [id number]   CSW will perform a case closure on patient, due to patient being admitted into [facility] for ten inpatient days or greater.  CSW will alert Natividad Brood and [facility] Liaison's with [facility], of CSW's plans to close patient's case, encouraging them to contact CSW upon patient's discharge.  CSW will then make arrangements to reopen patient's case, if social work needs are identified and patient is agreeable to services.  CSW will also notify [name], RNCM with [facility], of CSW's case closure plans.  [name], BSW, MSW, LCSW  Licensed Education officer, environmental Health System  Mailing [place] [address] [place], [place] [address] Physical Address-[address], [place], [place] [address] Toll Free Main # [phone] Fax # [phone] Cell # [phone]  Fax # [phone]  [name]   [employer]  Discrimination is Against the [employer]. and its subsidiaries comply with applicable Federal civil rights laws and do not discriminate on the basis of race, color, national origin, age, disability, or sex. Lewis do not exclude people or treat them differently because of race, color, national origin, age, disability, or sex.    [employer]. and its subsidiaries provide:  . Free auxiliary aids and services, such as qualified sign language interpreters, video remote interpretation, and written information in other formats to people with disabilities when such auxiliary aids and services are necessary to ensure an equal opportunity to participate. . Free language services to people whose primary language is not English when those services are necessary to provide meaningful access, such as translated documents  or oral interpretation.    If you need these services, call [phone] or if you use a TTY, call 711.   If you believe that [employer]. and its subsidiaries have failed to provide these services or discriminated in another way on the basis of race, color, national origin, age, disability, or sex, you can file a Tourist information centre manager with:   Discrimination [place] [address]   If you need help filing a grievance, call [phone] or if you use a TTY, call 711.  You can also file a civil rights complaint with the U.S. Department of Health and Financial controller, Office for Civil Rights electronically through the Office for Civil Rights Complaint Portal, available at OnSiteLending.nl.jsf, or by mail or phone at:   [place]. Department of Health and Human Services  [place], [place], [facility] Building  [place], [place]  [phone], [phone] (TDD)  Complaint forms are available at CutFunds.si             [place]: ATTENTION: If you do not speak English, language assistance services, free of charge, are available to you. Call [phone]  (TTY: [id number]).  Espaol (Spanish): ATENCIN: si habla espaol, tiene a su disposicin servicios gratuitos de asistencia lingstica. Llame al [phone] (TTY: [id number]).  ???? (Chinese): ?????????????????????????????? [phone] (TTY:711??  Ti?ng Vi?t (Vietnamese): CH : N?u b?n ni Ti?ng Vi?t, c cc d?ch v? h? tr? ngn ng? mi?n ph dnh cho b?n. G?i s? [phone] (TTY: [id number]).  ??? ([place]): ?? : ???? ????? ?? , ?? ?? ???? ??? ???? ? ???? . [phone] (TTY: 711)??? ??? ???? .  Tagalog (Tagalog - Filipino): PAUNAWA: Kung nagsasalita ka ng Tagalog,  maaari kang gumamit ng mga serbisyo ng tulong sa wika nang walang bayad. [place] sa [phone] (TTY: [id number]).   [place]):  :      ,      .  [phone] (: [id number]).  Kreyl Ayisyen ([place]): ATANSYON: Si w pale Ethelene Hal, gen svis d pou lang ki disponib gratis pou ou. Rele [phone] (TTY: [id number]).  Fonnie Jarvis Marland KitchenPakistan): ATTENTION : Si vous parlez franais, des services d'aide linguistique vous sont proposs gratuitement. Appelez le [phone] (ATS : [id number]).  Polski (Polish): UWAGA: Jeeli mwisz po polsku, moesz skorzysta z bezpatnej pomocy jzykowej. Zadzwo pod numer [phone] (TTY: [id number]).  Portugus ([place]): ATENO: Se fala portugus, encontram-se disponveis servios lingusticos, grtis. Ligue para [phone] (TTY: [id number]).   Italiano ([place]): ATTENZIONE: In caso la lingua parlata sia l'italiano, sono disponibili servizi di assistenza linguistica gratuiti. Chiamare il numero [phone] (TTY: [id number]).  Dawayne Patricia ([place]): ACHTUNG: Wenn Sie Deutsch sprechen, stehen Ihnen kostenlos sprachliche Hilfsdienstleistungen zur [employer]. [place]: [phone] (TTY: [id number]).   (Arabic): [phone]   .            : .)[id number] :   (  ??? ([place]): ??????????????????????????????????[phone] ?TTY?711?????????????????  ? (Farsi): [phone]  . ?   ? ?  ? ? ?~ ?  ?    : .??  (TTY: 711)  Din Bizaad ([place]): D77 baa ak0 n7n7zin: D77 saad bee y1n7[ti'go Risa Grill, saad bee 1k1'1n7da'1wo'd66', t'11 Pricilla Loveless n1 h0l=, koj8' h0d77lnih [phone] (TTY: [id number]).

## 2015-08-07 NOTE — Care Management Important Message (Signed)
Important Message  Patient Details  Name: LAURO TENORIO MRN: [id number] Date of Birth: 1945-07-15   Medicare Important Message Given:  Yes    Erenest Rasher, RN 08/07/2015, 3:11 PM

## 2015-08-08 DIAGNOSIS — E440 Moderate protein-calorie malnutrition: Secondary | ICD-10-CM | POA: Diagnosis not present

## 2015-08-08 DIAGNOSIS — K219 Gastro-esophageal reflux disease without esophagitis: Secondary | ICD-10-CM | POA: Diagnosis present

## 2015-08-08 DIAGNOSIS — K567 Ileus, unspecified: Secondary | ICD-10-CM | POA: Diagnosis not present

## 2015-08-08 DIAGNOSIS — Z96642 Presence of left artificial hip joint: Secondary | ICD-10-CM | POA: Diagnosis present

## 2015-08-08 DIAGNOSIS — M6281 Muscle weakness (generalized): Secondary | ICD-10-CM | POA: Diagnosis not present

## 2015-08-08 DIAGNOSIS — R262 Difficulty in walking, not elsewhere classified: Secondary | ICD-10-CM | POA: Diagnosis not present

## 2015-08-08 DIAGNOSIS — Z87891 Personal history of nicotine dependence: Secondary | ICD-10-CM | POA: Diagnosis not present

## 2015-08-08 DIAGNOSIS — J9622 Acute and chronic respiratory failure with hypercapnia: Secondary | ICD-10-CM | POA: Diagnosis not present

## 2015-08-08 DIAGNOSIS — F319 Bipolar disorder, unspecified: Secondary | ICD-10-CM | POA: Diagnosis present

## 2015-08-08 DIAGNOSIS — D72829 Elevated white blood cell count, unspecified: Secondary | ICD-10-CM | POA: Diagnosis not present

## 2015-08-08 DIAGNOSIS — R278 Other lack of coordination: Secondary | ICD-10-CM | POA: Diagnosis not present

## 2015-08-08 DIAGNOSIS — Z96641 Presence of right artificial hip joint: Secondary | ICD-10-CM | POA: Diagnosis present

## 2015-08-08 DIAGNOSIS — Z4682 Encounter for fitting and adjustment of non-vascular catheter: Secondary | ICD-10-CM | POA: Diagnosis not present

## 2015-08-08 DIAGNOSIS — J440 Chronic obstructive pulmonary disease with acute lower respiratory infection: Secondary | ICD-10-CM | POA: Diagnosis not present

## 2015-08-08 DIAGNOSIS — I130 Hypertensive heart and chronic kidney disease with heart failure and stage 1 through stage 4 chronic kidney disease, or unspecified chronic kidney disease: Secondary | ICD-10-CM | POA: Diagnosis not present

## 2015-08-08 DIAGNOSIS — A419 Sepsis, unspecified organism: Secondary | ICD-10-CM | POA: Diagnosis not present

## 2015-08-08 DIAGNOSIS — Z5181 Encounter for therapeutic drug level monitoring: Secondary | ICD-10-CM | POA: Diagnosis not present

## 2015-08-08 DIAGNOSIS — Z452 Encounter for adjustment and management of vascular access device: Secondary | ICD-10-CM | POA: Diagnosis not present

## 2015-08-08 DIAGNOSIS — E870 Hyperosmolality and hypernatremia: Secondary | ICD-10-CM | POA: Diagnosis present

## 2015-08-08 DIAGNOSIS — R079 Chest pain, unspecified: Secondary | ICD-10-CM | POA: Diagnosis not present

## 2015-08-08 DIAGNOSIS — I420 Dilated cardiomyopathy: Secondary | ICD-10-CM | POA: Diagnosis present

## 2015-08-08 DIAGNOSIS — Z7951 Long term (current) use of inhaled steroids: Secondary | ICD-10-CM | POA: Diagnosis not present

## 2015-08-08 DIAGNOSIS — J189 Pneumonia, unspecified organism: Secondary | ICD-10-CM | POA: Diagnosis not present

## 2015-08-08 DIAGNOSIS — J449 Chronic obstructive pulmonary disease, unspecified: Secondary | ICD-10-CM | POA: Diagnosis not present

## 2015-08-08 DIAGNOSIS — E872 Acidosis: Secondary | ICD-10-CM | POA: Diagnosis present

## 2015-08-08 DIAGNOSIS — Z86718 Personal history of other venous thrombosis and embolism: Secondary | ICD-10-CM | POA: Diagnosis not present

## 2015-08-08 DIAGNOSIS — T82199A Other mechanical complication of unspecified cardiac device, initial encounter: Secondary | ICD-10-CM | POA: Diagnosis not present

## 2015-08-08 DIAGNOSIS — I4891 Unspecified atrial fibrillation: Secondary | ICD-10-CM | POA: Diagnosis not present

## 2015-08-08 DIAGNOSIS — Z950 Presence of cardiac pacemaker: Secondary | ICD-10-CM | POA: Diagnosis not present

## 2015-08-08 DIAGNOSIS — R0602 Shortness of breath: Secondary | ICD-10-CM | POA: Diagnosis not present

## 2015-08-08 DIAGNOSIS — I482 Chronic atrial fibrillation: Secondary | ICD-10-CM | POA: Diagnosis not present

## 2015-08-08 DIAGNOSIS — Z7901 Long term (current) use of anticoagulants: Secondary | ICD-10-CM | POA: Diagnosis not present

## 2015-08-08 DIAGNOSIS — I509 Heart failure, unspecified: Secondary | ICD-10-CM | POA: Diagnosis not present

## 2015-08-08 DIAGNOSIS — T82868A Thrombosis of vascular prosthetic devices, implants and grafts, initial encounter: Secondary | ICD-10-CM | POA: Diagnosis not present

## 2015-08-08 DIAGNOSIS — Z888 Allergy status to other drugs, medicaments and biological substances status: Secondary | ICD-10-CM | POA: Diagnosis not present

## 2015-08-08 DIAGNOSIS — I10 Essential (primary) hypertension: Secondary | ICD-10-CM | POA: Diagnosis not present

## 2015-08-08 DIAGNOSIS — J90 Pleural effusion, not elsewhere classified: Secondary | ICD-10-CM | POA: Diagnosis not present

## 2015-08-08 DIAGNOSIS — Z79899 Other long term (current) drug therapy: Secondary | ICD-10-CM | POA: Diagnosis not present

## 2015-08-08 DIAGNOSIS — I5042 Chronic combined systolic (congestive) and diastolic (congestive) heart failure: Secondary | ICD-10-CM | POA: Diagnosis not present

## 2015-08-08 DIAGNOSIS — Y95 Nosocomial condition: Secondary | ICD-10-CM | POA: Diagnosis present

## 2015-08-08 DIAGNOSIS — Z8614 Personal history of Methicillin resistant Staphylococcus aureus infection: Secondary | ICD-10-CM | POA: Diagnosis not present

## 2015-08-08 DIAGNOSIS — L8990 Pressure ulcer of unspecified site, unspecified stage: Secondary | ICD-10-CM | POA: Diagnosis present

## 2015-08-08 DIAGNOSIS — I480 Paroxysmal atrial fibrillation: Secondary | ICD-10-CM | POA: Diagnosis not present

## 2015-08-08 DIAGNOSIS — I252 Old myocardial infarction: Secondary | ICD-10-CM | POA: Diagnosis not present

## 2015-08-08 DIAGNOSIS — Z7982 Long term (current) use of aspirin: Secondary | ICD-10-CM | POA: Diagnosis not present

## 2015-08-08 DIAGNOSIS — Z22322 Carrier or suspected carrier of Methicillin resistant Staphylococcus aureus: Secondary | ICD-10-CM | POA: Diagnosis not present

## 2015-08-08 DIAGNOSIS — I5020 Unspecified systolic (congestive) heart failure: Secondary | ICD-10-CM | POA: Diagnosis not present

## 2015-08-08 DIAGNOSIS — E43 Unspecified severe protein-calorie malnutrition: Secondary | ICD-10-CM | POA: Diagnosis not present

## 2015-08-08 DIAGNOSIS — J9600 Acute respiratory failure, unspecified whether with hypoxia or hypercapnia: Secondary | ICD-10-CM | POA: Diagnosis not present

## 2015-08-08 DIAGNOSIS — I5022 Chronic systolic (congestive) heart failure: Secondary | ICD-10-CM | POA: Diagnosis present

## 2015-08-08 DIAGNOSIS — Z681 Body mass index (BMI) 19 or less, adult: Secondary | ICD-10-CM | POA: Diagnosis not present

## 2015-08-08 DIAGNOSIS — G9340 Encephalopathy, unspecified: Secondary | ICD-10-CM | POA: Diagnosis not present

## 2015-08-08 DIAGNOSIS — J441 Chronic obstructive pulmonary disease with (acute) exacerbation: Secondary | ICD-10-CM | POA: Diagnosis not present

## 2015-08-08 DIAGNOSIS — R579 Shock, unspecified: Secondary | ICD-10-CM | POA: Diagnosis not present

## 2015-08-08 DIAGNOSIS — J9602 Acute respiratory failure with hypercapnia: Secondary | ICD-10-CM | POA: Diagnosis not present

## 2015-08-08 DIAGNOSIS — I248 Other forms of acute ischemic heart disease: Secondary | ICD-10-CM | POA: Diagnosis present

## 2015-08-08 DIAGNOSIS — E875 Hyperkalemia: Secondary | ICD-10-CM | POA: Diagnosis not present

## 2015-08-08 DIAGNOSIS — Z880 Allergy status to penicillin: Secondary | ICD-10-CM | POA: Diagnosis not present

## 2015-08-08 DIAGNOSIS — I481 Persistent atrial fibrillation: Secondary | ICD-10-CM | POA: Diagnosis not present

## 2015-08-08 DIAGNOSIS — J9601 Acute respiratory failure with hypoxia: Secondary | ICD-10-CM | POA: Diagnosis not present

## 2015-08-08 DIAGNOSIS — E785 Hyperlipidemia, unspecified: Secondary | ICD-10-CM | POA: Diagnosis present

## 2015-08-08 DIAGNOSIS — N183 Chronic kidney disease, stage 3 (moderate): Secondary | ICD-10-CM | POA: Diagnosis not present

## 2015-08-08 DIAGNOSIS — I5023 Acute on chronic systolic (congestive) heart failure: Secondary | ICD-10-CM | POA: Diagnosis not present

## 2015-08-08 DIAGNOSIS — I495 Sick sinus syndrome: Secondary | ICD-10-CM | POA: Diagnosis not present

## 2015-08-08 LAB — COMPREHENSIVE METABOLIC PANEL
ALBUMIN: 3 g/dL — AB (ref 3.5–5.0)
ALK PHOS: 62 U/L (ref 38–126)
ALT: 127 U/L — AB (ref 17–63)
ANION GAP: 12 (ref 5–15)
AST: 62 U/L — AB (ref 15–41)
BUN: 82 mg/dL — ABNORMAL HIGH (ref 6–20)
CALCIUM: 8.9 mg/dL (ref 8.9–10.3)
CO2: 20 mmol/L — AB (ref 22–32)
Chloride: 113 mmol/L — ABNORMAL HIGH (ref 101–111)
Creatinine, Ser: 2.49 mg/dL — ABNORMAL HIGH (ref 0.61–1.24)
GFR calc Af Amer: 28 mL/min — ABNORMAL LOW (ref >60)
GFR calc non Af Amer: 24 mL/min — ABNORMAL LOW (ref >60)
GLUCOSE: 111 mg/dL — AB (ref 65–99)
Potassium: 5.9 mmol/L — ABNORMAL HIGH (ref 3.5–5.1)
SODIUM: 145 mmol/L (ref 135–145)
Total Bilirubin: 0.6 mg/dL (ref 0.3–1.2)
Total Protein: 6.1 g/dL — ABNORMAL LOW (ref 6.5–8.1)

## 2015-08-08 LAB — BASIC METABOLIC PANEL
ANION GAP: 11 (ref 5–15)
BUN: 83 mg/dL — ABNORMAL HIGH (ref 6–20)
CHLORIDE: 114 mmol/L — AB (ref 101–111)
CO2: 21 mmol/L — AB (ref 22–32)
Calcium: 8.9 mg/dL (ref 8.9–10.3)
Creatinine, Ser: 2.41 mg/dL — ABNORMAL HIGH (ref 0.61–1.24)
GFR calc non Af Amer: 25 mL/min — ABNORMAL LOW (ref >60)
GFR, EST AFRICAN AMERICAN: 29 mL/min — AB (ref >60)
Glucose, Bld: 108 mg/dL — ABNORMAL HIGH (ref 65–99)
POTASSIUM: 6.5 mmol/L — AB (ref 3.5–5.1)
Sodium: 146 mmol/L — ABNORMAL HIGH (ref 135–145)

## 2015-08-08 LAB — DIGOXIN LEVEL: Digoxin Level: 0.9 ng/mL (ref 0.8–2.0)

## 2015-08-08 MED ORDER — SENNOSIDES-DOCUSATE SODIUM 8.6-50 MG PO TABS: 1.0000 | ORAL_TABLET | Freq: Every day | ORAL | Status: DC | PRN

## 2015-08-08 MED ORDER — UREA 37.5 % EX CREA: 1.0000 "application " | TOPICAL_CREAM | Freq: Every day | CUTANEOUS | Status: DC | PRN

## 2015-08-08 MED ORDER — OXYCODONE-ACETAMINOPHEN 10-325 MG PO TABS: 1.0000 | ORAL_TABLET | ORAL | Status: DC | PRN

## 2015-08-08 MED ORDER — PREDNISONE 10 MG PO TABS: 10.0000 mg | ORAL_TABLET | Freq: Every day | ORAL | Status: DC

## 2015-08-08 MED ORDER — DIGOXIN 62.5 MCG PO TABS: 0.0625 mg | ORAL_TABLET | Freq: Every day | ORAL | Status: DC

## 2015-08-08 MED ORDER — METOPROLOL SUCCINATE ER 200 MG PO TB24: 200.0000 mg | ORAL_TABLET | Freq: Every day | ORAL | Status: DC

## 2015-08-08 MED ORDER — APIXABAN 2.5 MG PO TABS: 2.5000 mg | ORAL_TABLET | Freq: Two times a day (BID) | ORAL | Status: DC

## 2015-08-08 MED ORDER — BUDESONIDE 0.25 MG/2ML IN SUSP: 0.2500 mg | Freq: Two times a day (BID) | RESPIRATORY_TRACT | Status: DC

## 2015-08-08 NOTE — Progress Notes (Signed)
Subjective: Patient says he is okay this morning. He keeps his eyes closed most of today's visit and says we are treating him like a child. Otherwise he his near his baseline.  Objective: Vital signs in last 24 hours: Filed Vitals:   08/07/15 2103 08/07/15 2210 08/08/15 0500 08/08/15 0815  BP:  118/92    Pulse:  96    Temp:  98.2 F (36.8 C)    TempSrc:  Oral    Resp:      Height:      Weight:   157 lb 9.6 oz (71.487 kg)   SpO2: 98% 93%  98%   Weight change: -7.5 oz (-0.213 kg)  Intake/Output Summary (Last 24 hours) at 08/08/15 1156 Last data filed at 08/08/15 1113  Gross per 24 hour  Intake  252.5 ml  Output    500 ml  Net -247.5 ml   General: resting in bed, no acute distress Cardiac: irregularly irregular Pulm: clear to ausculation bilaterally, distant breath sounds Abd: hypoactive bowel sounds, nontender to palpation, soft Ext: no pitting edema   Assessment/Plan: Principal Problem:   CAP (community acquired pneumonia) Active Problems:   Essential hypertension   Cardiac pacemaker in situ   COPD (chronic obstructive pulmonary disease) (HCC)   G E R D   Long-term (current) use of anticoagulants   H/O Arterial embolism of leg    Depression   Chronic kidney disease (CKD), stage III (moderate)   Chronic combined systolic and diastolic congestive heart failure (HCC)   Non-ischemic cardiomyopathy (HCC)   Longstanding persistent atrial fibrillation (HCC)   Protein-calorie malnutrition, severe (HCC)   Chronic pain   Acute-on-chronic kidney injury (HCC)   Atrial fibrillation with RVR (HCC)   Acute systolic congestive heart failure (HCC)   Normal coronary arteries-2003   Demand ischemia (HCC)   Hyperkalemia   Intestinal obstruction (HCC)   Ileus (HCC)  A. Fib with RVR: CHADSVASc of 6 with 9.8% yearly risk of stroke. Transitioned off Diltiazem gtt yesterday. -Eliquis 2.5 mg bid renal dose -Metoprolol 200 mg qd po 24h tablet -IV Metoprolol 5 mg q4h prn HR >120,  hold if SBP <90 -Digoxin 0.0625 po mg qd -f/u with Cardiology outpatient  COPD Exacerbation: No appreciable wheezing on lung exam today. 10 day steroid course completed yesterday with short taper to be completed tomorrow. -Xopenex q4h prn -Prednisone 20 mg po today, 10 md tomorrow  -Pulmicort nebulizer BID -SNF on discharge  Ileus: Abdominal distension improved. NG tube removed on 3/18.  Patient tolerating oral intake, passing gas with bowel movements. -Renal low potassium diet -Miralax -Dulcolax prn  Acute on CKD: Patient with history of CKD 3 thought secondary to cardiorenal syndrome. His baseline SCr seems to be around 1.70-2.00. He was seen by Nephrology last month and at that time not considered a great candidate for dialysis if renal disease progresses. SCr 2.49 this morning. Potassium elevated at 5.9.  -f/u BMP -f/u Nephrology outpatient as soon as possible   Systolic CHF: EF was 99991111 on 5//2016 ECHO.  -Daily weights -Strict I&Os -Metoprolol succinate 200 mg qd   Community Acquired Pneumonia: Completed treatment of CAP with 5 total days Azithromycin.  -f/u Sputum culture >> few staph aureus, positive MRSA Nares, likely from colonization.    Chronic Pain:  On Percocet 10-325 mg q4hr at home prn for chronic hip and low back pain, bilateral hip replacements, and OA of multiple joints.   Dispo: Discharge to SNF when bed available.  LOS: 11 days   Zada Finders, MD 08/08/2015, 11:56 AM

## 2015-08-08 NOTE — Progress Notes (Addendum)
Case Management Note Initial Note started by Jonnie Finner RN Case Manager Patient Details  Name: [name] MRN: [id number] Date of Birth: 28-Oct-1945  Subjective/Objective: Persistent atrial fibrillation with RVR, ileus, and hyperkalemia      Action/Plan: Discharge Planning: Pt remains on Cardizem gtt. CSW following for SNF placement. Pt lives alone with limited support at home. Pt has oxygen at home. Scheduled dc to SNF when medically stable.   Please see previous NCM notes.    Expected Discharge Date:     Expected Discharge Plan: Skilled Nursing Facility  In-House Referral: Clinical Social Work  Discharge planning Services CM Consult  Post Acute Care Choice: NA Choice offered to: NA  DME Arranged: N/A DME Agency: NA  HH Arranged: NA HH Agency: NA  Status of Service: Completed, signed off  Medicare Important Message Given: Yes Date Medicare IM Given:   Medicare IM give by:   Date Additional Medicare IM Given:   Additional Medicare Important Message give by:    If discussed at [facility] of Stay Meetings, dates discussed:   Additional Comments:  1427 08-08-15 Jacqlyn Krauss, RN,BSN [phone] No private room at [facility] now for [facility]. No further needs from CM at this time.    1230 08-08-15 Jacqlyn Krauss, RN,BSN [phone] CSW assistign pt with disposition needs. Plan for pt to d/c to [facility] is available. No further needs from CM at this time.

## 2015-08-08 NOTE — Clinical Social Work Placement (Signed)
   CLINICAL SOCIAL WORK PLACEMENT  NOTE  Date:  08/08/2015  Patient Details  Name: [name] MRN: [id number] Date of Birth: 24-Oct-1945  Clinical Social Work is seeking post-discharge placement for this patient at the [place] level of care (*CSW will initial, date and re-position this form in  chart as items are completed):  Yes   Patient/family provided with [place] Work Department's list of facilities offering this level of care within the geographic area requested by the patient (or if unable, by the patient's family).  Yes   Patient/family informed of their freedom to choose among providers that offer the needed level of care, that participate in Medicare, Medicaid or managed care program needed by the patient, have an available bed and are willing to accept the patient.  Yes   Patient/family informed of [facility]'s ownership interest in [facility] and [facility], as well as of the fact that they are under no obligation to receive care at these facilities.  PASRR submitted to EDS on       PASRR number received on       Existing PASRR number confirmed on 08/08/15     FL2 transmitted to all facilities in geographic area requested by pt/family on 08/07/15     FL2 transmitted to all facilities within larger geographic area on       Patient informed that his/her managed care company has contracts with or will negotiate with certain facilities, including the following:        Yes   Patient/family informed of bed offers received.  Patient chooses bed at [facility]     Physician recommends and patient chooses bed at      Patient to be transferred to [facility] on 08/08/15.  Patient to be transferred to facility by [facility]     Patient family notified on 08/08/15 of transfer.  Name of family member notified:  Derrick     PHYSICIAN Please prepare prescriptions     Additional Comment:   Per MD,  patient ready to discharge to [facility]. RN, patient/family, and facility notified of patient's discharge. RN given number for report. DC packet on chart. Ambulance transport requested. Per Narda Rutherford at [facility], patient can fill out paperwork at facility. Message left with Narda Rutherford that transportation is two hours behind. Patient daughter Davy Pique was contacted and she was agreeable with plan to send patient to [facility]. BSW intern signing off.   _______________________________________________  Raynelle Highland BSW Intern, [id number]

## 2015-08-08 NOTE — Progress Notes (Signed)
Voicing concerns about discharge disposition. Transfer to [facility] arranged by CSW as safe discharge plan vs home with family. MD resident paged to talk to pt about concerns related to discharge. Son into room at bedside. Ambulance transfer arranged and due to arrive within the hour. Verbalized understanding. Report called to RN at [facility] anticipating transfer soon.

## 2015-08-08 NOTE — Progress Notes (Signed)
Pt c/o SOB, 02sat upper 70s, 02 BNC increased to 4 liters, PRN breathing tx given, will continue to monitor closely.  Edward Qualia RN

## 2015-08-08 NOTE — Progress Notes (Signed)
CRITICAL VALUE ALERT  Critical value received: KCL 6.5    Date of notification:  08/08/2015  Time of notification:  [id number]  Critical value read back:Yes.    Nurse who received alert:  Edward Qualia RN  MD notified (1st page):   Time of first page:  [phone]   Report from lab that sample was hemo lysed, reorderd kcl labs.

## 2015-08-08 NOTE — Discharge Instructions (Signed)
Please continue taking your Metoprolol. Stop taking the Diltiazem and Megace.  Please continue your breathing treatments.  Please follow up with your heart and kidney doctors.  We are discharging you to a [facility] for rehab to help you gain your strength.     Atrial Fibrillation Atrial fibrillation is a type of irregular or rapid heartbeat (arrhythmia). In atrial fibrillation, the heart quivers continuously in a chaotic pattern. This occurs when parts of the heart receive disorganized signals that make the heart unable to pump blood normally. This can increase the risk for stroke, heart failure, and other heart-related conditions. There are different types of atrial fibrillation, including:  Paroxysmal atrial fibrillation. This type starts suddenly, and it usually stops on its own shortly after it starts.  Persistent atrial fibrillation. This type often lasts longer than a week. It may stop on its own or with treatment.  Long-lasting persistent atrial fibrillation. This type lasts longer than 12 months.  Permanent atrial fibrillation. This type does not go away. Talk with your health care provider to learn about the type of atrial fibrillation that you have. CAUSES This condition is caused by some heart-related conditions or procedures, including:  A heart attack.  Coronary artery disease.  Heart failure.  Heart valve conditions.  High blood pressure.  Inflammation of the sac that surrounds the heart (pericarditis).  Heart surgery.  Certain heart rhythm disorders, such as Wolf-Parkinson-White syndrome. Other causes include:  Pneumonia.  Obstructive sleep apnea.  Blockage of an artery in the lungs (pulmonary embolism, or PE).  Lung cancer.  Chronic lung disease.  Thyroid problems, especially if the thyroid is overactive (hyperthyroidism).  Caffeine.  Excessive alcohol use or illegal drug use.  Use of some medicines, including certain  decongestants and diet pills. Sometimes, the cause cannot be found. RISK FACTORS This condition is more likely to develop in:  People who are older in age.  People who smoke.  People who have diabetes mellitus.  People who are overweight (obese).  Athletes who exercise vigorously. SYMPTOMS Symptoms of this condition include:  A feeling that your heart is beating rapidly or irregularly.  A feeling of discomfort or pain in your chest.  Shortness of breath.  Sudden light-headedness or weakness.  Getting tired easily during exercise. In some cases, there are no symptoms. DIAGNOSIS Your health care provider may be able to detect atrial fibrillation when taking your pulse. If detected, this condition may be diagnosed with:  An electrocardiogram (ECG).  A Holter monitor test that records your heartbeat patterns over a 24-hour period.  Transthoracic echocardiogram (TTE) to evaluate how blood flows through your heart.  Transesophageal echocardiogram (TEE) to view more detailed images of your heart.  A stress test.  Imaging tests, such as a CT scan or chest X-ray.  Blood tests. TREATMENT The main goals of treatment are to prevent blood clots from forming and to keep your heart beating at a normal rate and rhythm. The type of treatment that you receive depends on many factors, such as your underlying medical conditions and how you feel when you are experiencing atrial fibrillation. This condition may be treated with:  Medicine to slow down the heart rate, bring the heart's rhythm back to normal, or prevent clots from forming.  Electrical cardioversion. This is a procedure that resets your heart's rhythm by delivering a controlled, low-energy shock to the heart through your skin.  Different types of ablation, such as catheter ablation, catheter ablation with pacemaker, or surgical ablation.  These procedures destroy the heart tissues that send abnormal signals. When the  pacemaker is used, it is placed under your skin to help your heart beat in a regular rhythm. HOME CARE INSTRUCTIONS  Take over-the counter and prescription medicines only as told by your health care provider.  If your health care provider prescribed a blood-thinning medicine (anticoagulant), take it exactly as told. Taking too much blood-thinning medicine can cause bleeding. If you do not take enough blood-thinning medicine, you will not have the protection that you need against stroke and other problems.  Do not use tobacco products, including cigarettes, chewing tobacco, and e-cigarettes. If you need help quitting, ask your health care provider.  If you have obstructive sleep apnea, manage your condition as told by your health care provider.  Do not drink alcohol.  Do not drink beverages that contain caffeine, such as coffee, soda, and tea.  Maintain a healthy weight. Do not use diet pills unless your health care provider approves. Diet pills may make heart problems worse.  Follow diet instructions as told by your health care provider.  Exercise regularly as told by your health care provider.  Keep all follow-up visits as told by your health care provider. This is important. PREVENTION  Avoid drinking beverages that contain caffeine or alcohol.  Avoid certain medicines, especially medicines that are used for breathing problems.  Avoid certain herbs and herbal medicines, such as those that contain ephedra or ginseng.  Do not use illegal drugs, such as cocaine and amphetamines.  Do not smoke.  Manage your high blood pressure. SEEK MEDICAL CARE IF:  You notice a change in the rate, rhythm, or strength of your heartbeat.  You are taking an anticoagulant and you notice increased bruising.  You tire more easily when you exercise or exert yourself. SEEK IMMEDIATE MEDICAL CARE IF:  You have chest pain, abdominal pain, sweating, or weakness.  You feel nauseous.  You notice  blood in your vomit, bowel movement, or urine.  You have shortness of breath.  You suddenly have swollen feet and ankles.  You feel dizzy.  You have sudden weakness or numbness of the face, arm, or leg, especially on one side of the body.  You have trouble speaking, trouble understanding, or both (aphasia).  Your face or your eyelid droops on one side. These symptoms may represent a serious problem that is an emergency. Do not wait to see if the symptoms will go away. Get medical help right away. Call your local emergency services (911 in the [place] Do not drive yourself to the hospital.   This information is not intended to replace advice given to you by your health care provider. Make sure you discuss any questions you have with your health care provider.   Document Released: 05/06/2005 Document Revised: 01/25/2015 Document Reviewed: 08/31/2014 Elsevier Interactive Patient Education [employer].

## 2015-08-08 NOTE — Discharge Summary (Signed)
Name: [name] MRN: [id number] DOB: 01/02/1946 70 y.o. PCP: Zada Finders, MD  Date of Admission: 07/28/2015  6:16 PM Date of Discharge: 08/08/2015 Attending Physician: Annia Belt, MD  Discharge Diagnosis: 1. Afib with RVR 2. COPD exacerbation 3. Community acquired pneumonia Principal Problem:   CAP (community acquired pneumonia) Active Problems:   Essential hypertension   Cardiac pacemaker in situ   COPD (chronic obstructive pulmonary disease) ([place])   G E R D   Long-term (current) use of anticoagulants   H/O Arterial embolism of leg    Depression   Chronic kidney disease (CKD), stage III (moderate)   Chronic combined systolic and diastolic congestive heart failure (HCC)   Non-ischemic cardiomyopathy (HCC)   Longstanding persistent atrial fibrillation (HCC)   Protein-calorie malnutrition, severe (HCC)   Chronic pain   Acute-on-chronic kidney injury ([place])   Atrial fibrillation with RVR (HCC)   Acute systolic congestive heart failure (HCC)   Normal coronary arteries-2003   Demand ischemia (HCC)   Hyperkalemia   Intestinal obstruction ([place])   Ileus (HCC)  Discharge Medications:   Medication List    STOP taking these medications        diltiazem 120 MG 24 hr capsule  Commonly known as:  CARDIZEM CD     diphenhydrAMINE 2 % cream  Commonly known as:  BENADRYL     megestrol 40 MG/ML suspension  Commonly known as:  MEGACE      TAKE these medications        albuterol 108 (90 Base) MCG/ACT inhaler  Commonly known as:  VENTOLIN HFA  Inhale 2 puffs into the lungs every 6 (six) hours as needed for wheezing or shortness of breath.     apixaban 2.5 MG Tabs tablet  Commonly known as:  ELIQUIS  Take 1 tablet (2.5 mg total) by mouth 2 (two) times daily.     aspirin 81 MG chewable tablet  Chew 1 tablet (81 mg total) by mouth daily.     bisacodyl 10 MG suppository  Commonly known as:  DULCOLAX  Place 1 suppository (10 mg total) rectally daily as needed  for severe constipation.     budesonide 0.25 MG/2ML nebulizer solution  Commonly known as:  PULMICORT  Take 2 mLs (0.25 mg total) by nebulization 2 (two) times daily.     buPROPion 75 MG tablet  Commonly known as:  WELLBUTRIN  Take 1 tablet (75 mg total) by mouth 2 (two) times daily.     Digoxin 62.5 MCG Tabs  Take 0.0625 mg by mouth daily.     feeding supplement (ENSURE ENLIVE) Liqd  Take 237 mLs by mouth 2 (two) times daily between meals.     gabapentin 300 MG capsule  Commonly known as:  NEURONTIN  TAKE ONE CAPSULE BY MOUTH 3 TIMES A DAY     metoprolol 200 MG 24 hr tablet  Commonly known as:  TOPROL-XL  Take 1 tablet (200 mg total) by mouth daily. Take with or immediately following a meal.     mirtazapine 15 MG tablet  Commonly known as:  REMERON  TAKE 1 TABLET BY MOUTH AT BEDTIME     Naloxone HCl 0.4 MG/0.4ML Soaj  Commonly known as:  EVZIO  Inject 0.4 mLs as directed as needed (opioid overdose). [name]     omeprazole 40 MG capsule  Commonly known as:  PRILOSEC  Take 1 capsule (40 mg total) by mouth daily.     oxyCODONE-acetaminophen 10-325 MG tablet  Commonly known as:  PERCOCET  Take 1 tablet by mouth every 4 (four) hours as needed for pain. Do not take more than 5 doses in a 24 hour period.     predniSONE 10 MG tablet  Commonly known as:  DELTASONE  Take 1 tablet (10 mg total) by mouth daily with breakfast.  Start taking on:  08/09/2015     rosuvastatin 20 MG tablet  Commonly known as:  CRESTOR  Take 1 tablet (20 mg total) by mouth daily at 6 PM.     senna-docusate 8.6-50 MG tablet  Commonly known as:  [employer] SENNA PLUS  Take 1 tablet by mouth daily as needed for moderate constipation.     tiotropium 18 MCG inhalation capsule  Commonly known as:  SPIRIVA  Place 1 capsule (18 mcg total) into inhaler and inhale daily.     Urea 37.5 % Crea  Apply 1 application topically daily as needed (for irritation).        Disposition and follow-up:     Mr.Jebidiah F Croney was discharged from [facility] in Stable condition.  At the hospital follow up visit please address:  1.   Atrial fibrillation: Patient discharged on Toprol-XL 200 mg daily. Prior diltiazem is discontinued in setting of LV dysfunction. Eliquis dose is decreased to 2.5 mg BID in setting of renal disease.   COPD: Complete Prednisone taper with one dose 10 mg on 08/09/15. Address if patient adherent to breathing treatments and providing relief.  CKD: Patient with worsened renal disease associated with ileus. Needs close follow up with Nephrology outpatient for further management and recommendations.   CHF: Diltiazem discontinued due to LV dysfunction and Metoprolol resumed. Megace was discontinued due to risk for volume overload and VTE in this patient with prior DVT.  Ileus: Address if patient eating well with adequate bowel movements.   Follow up: Patient has follow up appointments with Cardiology and Nephrology as below. Will need to schedule follow up with Internal Medicine Clinic after discharge from SNF.   2.  Labs / imaging needed at time of follow-up: Monitor BMP for renal function and hyperkalemia.  3.  Pending labs/ test needing follow-up: none  Follow-up Appointments: Follow-up Information    Follow up with Thompson Grayer, MD. Go on 08/09/2015.   Specialty:  Cardiology   Why:  At 2:00 pm.   Contact information:   [address] [place] [address] [phone]       Follow up with Louis Meckel, MD. Go on 08/16/2015.   Specialty:  Nephrology   Why:  At 2:00 pm.   Contact information:   [address] [place] [address] [phone]       Schedule an appointment as soon as possible for a visit with Zada Finders, MD.   Specialty:  Internal Medicine   Contact information:   [place] [id number] [phone]       Discharge Instructions: Discharge Instructions    AMB Referral to [facility]  Management    Complete by:  As directed   Patient is currently engaged with [facility]. Currently at [facility]. Please assign to [place] and [facility] LCSW. Disease management for CHF and social concerns. Also history of AFIB, COPD. Written consent obtained. Please see notes. Office MD visits may be more appropriate. Please call with questions. Thanks. Marthenia Rolling, MSN-Ed, [facility] [id number]  Reason for consult:  Please assign to [place] and  [facility] LCSW  Diagnoses of:  Heart Failure  Expected date of contact:  1-3 days (reserved for hospital discharges)           Consultations:    Procedures Performed:  Dg Chest 2 View  07/28/2015  CLINICAL DATA:  Shortness of breath, productive cough, tachycardia, chest pain, weakness, dizziness EXAM: CHEST  2 VIEW COMPARISON:  06/16/2015 FINDINGS: Mild patchy right lower lobe opacity, suspicious for pneumonia, less likely atelectasis. Associated small right pleural effusion. Stable scarring in the left mid lung and left lung base. Volume loss in the left hemithorax. Small left pleural effusion with apical capping. No pneumothorax. The heart is top-normal in size.  Right subclavian pacemaker. IMPRESSION: Mild patchy right lower lobe opacity, suspicious for pneumonia, less likely atelectasis. Associated small bilateral pleural effusions. Chronic left lung scarring with volume loss. Electronically Signed   By: Julian Hy M.D.   On: 07/28/2015 20:00   Dg Chest Port 1 View  08/04/2015  CLINICAL DATA:  Persistent shortness of breath. EXAM: PORTABLE CHEST 1 VIEW COMPARISON:  07/29/2015 FINDINGS: The heart is mildly enlarged but stable. There is tortuosity and calcification of the thoracic aorta. Stable extensive scarring changes involving the left lung and chronic pleural thickening. The right lung remains clear. There is an NG tube coursing down the esophagus and into the stomach. The pacer wires are stable. IMPRESSION:  Chronic left lung scarring changes and chronic pleural thickening. No definite acute overlying pulmonary process. NG tube in good position. Electronically Signed   By: Marijo Sanes M.D.   On: 08/04/2015 08:25   Dg Chest Port 1 View  07/29/2015  CLINICAL DATA:  Shortness of breath EXAM: PORTABLE CHEST 1 VIEW COMPARISON:  July 28, 2015 FINDINGS: Layering effusion on the right is identified, unchanged. Underlying opacity is seen. New mild opacity in the right mid lung. Chronic changes again seen in the left lung. Mild increasing density seen in the left mid lung as well. Pleural capping again seen on the left. The cardiomediastinal silhouette is stable. IMPRESSION: Increasing mid lung opacities bilaterally. Stable layering effusion on the right. Recommend follow-up to resolution. Electronically Signed   By: Dorise Bullion III M.D   On: 07/29/2015 15:03   Dg Abd Portable 1v  08/04/2015  CLINICAL DATA:  Ileus.  NG tube placement. EXAM: PORTABLE ABDOMEN - 1 VIEW COMPARISON:  08/02/2015 FINDINGS: An enteric tube has been placed with tip projecting just right of midline in the upper abdomen, likely in the distal stomach. The stomach has been decompressed. Gas-filled, mildly dilated loops of small and large bowel remain throughout the abdomen, minimally improved from prior. Limited assessment for free air on this supine study. Bilateral hip arthroplasties are partially visualized. IMPRESSION: 1. Enteric tube terminates over the distal stomach. 2. Diffuse gaseous distention of small and large bowel compatible with ileus and minimally improved from prior. Electronically Signed   By: Logan Bores M.D.   On: 08/04/2015 08:17   Dg Abd Portable 1v  08/02/2015  CLINICAL DATA:  Mid abdominal pain EXAM: PORTABLE ABDOMEN - 1 VIEW COMPARISON:  10/13/2014 abdominal CT FINDINGS: Diffuse predominately gas dilated bowel from the stomach to the rectum. No gross pneumoperitoneum but limited by supine study with multiple closely  positioned bowel loops. No concerning intra-abdominal mass effect or calcification. Degenerative disc disease and bilateral hip arthroplasty. IMPRESSION: Diffuse gas dilated bowel favoring adynamic ileus over low colonic obstruction. The stomach is prominently distended. Electronically Signed   By: Neva Seat.D.  On: 08/02/2015 23:33    2D Echo: n/a  Cardiac Cath: n/a  Admission HPI: Mr. Sabath is a 70 year old male with a past medical history of systolic CHF EF [id number], ETOH abuse, COPD, CKD III, CAD presenting with complaints of shortness of breath, cough and chills. He reports for the past two days he has had worsening shortness of breath with associated nonproductive cough. He reports that he has also had chest palpations with substernal chest pressure since his shortness of breath began. The pain does not radiate anywhere and is constant. Reports similar pain in the past when he has been in atrial fibrillation. Denies any fevers but reports chills. Reports some improvement in his shortness of breath with albuterol treatment at home but symptoms have gotten progressively worse. Repeats headache and lightheadedness and dizziness upon standing. Has had good PO intake over the past 2 days. Does note worsening bilateral lower extremity edema the past 2 days but no leg pain. No vision changes, nausea/vomiting/diarrhea, abdominal pain or myalgias. Says his girlfriend was diagnosed with the flu 2 days ago before his symptoms began.   He was found to be in atrial fibrillation with RVR rates between 140-160 on arrival to the ED. He is on digoxin, diltiazem and eliquis for his atrial fibrillation. Reports compliance with his medications. Of note, [name]. This was decreased to 80 mg bid in clinic on 3/1. Patient does have a history of multiple venous clots in the past.   Hospital Course by problem list: Principal Problem:   CAP  (community acquired pneumonia) Active Problems:   Essential hypertension   Cardiac pacemaker in situ   COPD (chronic obstructive pulmonary disease) ([place])   G E R D   Long-term (current) use of anticoagulants   H/O Arterial embolism of leg    Depression   Chronic kidney disease (CKD), stage III (moderate)   Chronic combined systolic and diastolic congestive heart failure (HCC)   Non-ischemic cardiomyopathy (HCC)   Longstanding persistent atrial fibrillation (HCC)   Protein-calorie malnutrition, severe (HCC)   Chronic pain   Acute-on-chronic kidney injury ([place])   Atrial fibrillation with RVR (HCC)   Acute systolic congestive heart failure (HCC)   Normal coronary arteries-2003   Demand ischemia (HCC)   Hyperkalemia   Intestinal obstruction (HCC)   Ileus (HCC)   A. Fib with RVR: CHADSVASc of 6 with 9.8% yearly risk of stroke. Rapid ventricular response thought related to CAP/COPD. Required diltiazem drip for several days. This was eventually titrated off with transition to oral Toprol-XL 200 mg daily. Home Digoxin was continued. Home diltiazem was discontinued per cardiology recommendation due to LV dysfunction. Eliquis dose was reduced to 2.5 mg BID in setting of renal disease.  Ileus: Patient developed ileus with distended bowel and dilated air-filled bowels on imaging. He required decompression with NG tube which immediately put out 2 liters fluid. His symptoms improved over the next 24 hours and NG tube was removed on 3/18. Patient was tolerating oral intake well with normal bowel movements prior to discharge.  Acute on CKD: Patient with history of CKD 3 thought secondary to cardiorenal syndrome. His baseline SCr seems to be around 1.70-2.00. He was seen by Nephrology 4 weeks prior to admission and at that time not considered a great candidate for dialysis if renal disease progresses. SCr was elevated and worsened after initially given Lasix. Renal function improved to slightly worse than  baseline after decompression  of ileus.  Hyperkalemia: Patient hyperkalemic in setting of renal disease. Required several doses of kayexalate per rectum due to ileus with improvement in potassium afterwards.  COPD exacerbation: Patient uses Spiriva daily and normally uses Albuterol bid at home. Reported increased need for albuterol with some relief. Had diffuse wheezing and rhonchi on presentation. Breathing treatments were begun with Pulmicort, Atrovent, and Xopenex. A 10 day steroid course was started with IV solumedrol and transitioned to Prednisone with short taper to end on 08/09/15. Wheezing and rhonchi resolved prior to discharge.   Community Acquired Pneumonia: CXR with mild patchy right lower lobe opacity suspicious of PNA.[name] Patient was started on Azithromycin and Rocephin in the ED for CAP. Completed treatment of CAP with 5 total days Azithromycin.  CHF: EF was 30-35% on 5//2016 ECHO. Patient on Megace for about 2 weeks prior to admission for appetite stimulation. He had significant weight gain of 14 lbs but clinically did not appear volume overloaded. Lasix was given early during admission with significant bump in creatinine afterwards. As patient did not appear volume overloaded clinically, diuresis was discontinued. Megace was also discontinued.   Discharge Vitals:   BP 120/89 mmHg  Pulse 98  Temp(Src) 98 F (36.7 C) (Oral)  Resp 20  Ht 6\' 3"  (1.905 m)  Wt 157 lb 9.6 oz (71.487 kg)  BMI 19.70 kg/m2  SpO2 98%  Discharge Labs:  Results for orders placed or performed during the hospital encounter of 07/28/15 (from the past 24 hour(s))  Basic metabolic panel     Status: Abnormal   Collection Time: 08/08/15  3:37 AM  Result Value Ref Range   Sodium 146 (H) 135 - 145 mmol/L   Potassium 6.5 (HH) 3.5 - 5.1 mmol/L   Chloride 114 (H) 101 - 111 mmol/L   CO2 21 (L) 22 - 32 mmol/L   Glucose, Bld 108 (H) 65 - 99 mg/dL   BUN 83 (H) 6 - 20 mg/dL   Creatinine, Ser 2.41 (H) 0.61 - 1.24  mg/dL   Calcium 8.9 8.9 - 10.3 mg/dL   GFR calc non Af Amer 25 (L) >60 mL/min   GFR calc Af Amer 29 (L) >60 mL/min   Anion gap 11 5 - 15  Digoxin level     Status: None   Collection Time: 08/08/15  3:37 AM  Result Value Ref Range   Digoxin Level 0.9 0.8 - 2.0 ng/mL  Comprehensive metabolic panel     Status: Abnormal   Collection Time: 08/08/15  6:28 AM  Result Value Ref Range   Sodium 145 135 - 145 mmol/L   Potassium 5.9 (H) 3.5 - 5.1 mmol/L   Chloride 113 (H) 101 - 111 mmol/L   CO2 20 (L) 22 - 32 mmol/L   Glucose, Bld 111 (H) 65 - 99 mg/dL   BUN 82 (H) 6 - 20 mg/dL   Creatinine, Ser 2.49 (H) 0.61 - 1.24 mg/dL   Calcium 8.9 8.9 - 10.3 mg/dL   Total Protein 6.1 (L) 6.5 - 8.1 g/dL   Albumin 3.0 (L) 3.5 - 5.0 g/dL   AST 62 (H) 15 - 41 U/L   ALT 127 (H) 17 - 63 U/L   Alkaline Phosphatase 62 38 - 126 U/L   Total Bilirubin 0.6 0.3 - 1.2 mg/dL   GFR calc non Af Amer 24 (L) >60 mL/min   GFR calc Af Amer 28 (L) >60 mL/min   Anion gap 12 5 - 15   *Note: Due to a large number  of results and/or encounters for the requested time period, some results have not been displayed. A complete set of results can be found in Results Review.    Signed: Zada Finders, MD 08/08/2015, 1:53 PM    Services Ordered on Discharge: SNF Equipment Ordered on Discharge: none

## 2015-08-08 NOTE — Progress Notes (Signed)
Physical Therapy Treatment Patient Details Name: [name] MRN: [id number] DOB: August 31, 1945 Today's Date: 08/08/2015    History of Present Illness 70 year old male with a past medical history of chronic systolic CHF with nonischemic DCM -EF 30-35%. He had normal coronary arteries by cath in 2003 and low risk Myoview Feb 2015. Echo May 2016- EF 30-35%. There is also a history of ETOH abuse, COPD, CKD III, and CAF with SSS. He is s/p St Jude PTVDP '04-gen change 2010. ON arrival in ED he was found to be in AF w RVR. CXR showed bilateral effusions and ? Atelectasis vs PNA in RLL.     PT Comments    Pt performed decreased mobility becoming increasingly agitated when asked to reattempt transfer to the chair.  Pt pleasant during bed mobility and therapeutic exercises but became agitated at end of treatment when asked to transfer to the chair.  Follow Up Recommendations  SNF;Supervision for mobility/OOB     Equipment Recommendations  None recommended by PT    Recommendations for Other Services       Precautions / Restrictions Precautions Precautions: Fall;ICD/Pacemaker Restrictions Weight Bearing Restrictions: No    Mobility  Bed Mobility Overal bed mobility: Needs Assistance Bed Mobility: Supine to Sit Rolling: Min assist   Supine to sit: Min guard     General bed mobility comments: HOB elevated and pt uses bed rail.  Pt able to self assist in scooting in supine with rail and bed in flat position.    Transfers Overall transfer level:  (Pt declined OOB reports dizziness and intolerable pain in lower abdomen.  )                  Ambulation/Gait                 Stairs            Wheelchair Mobility    Modified Rankin (Stroke Patients Only)       Balance     Sitting balance-Leahy Scale: Fair                              Cognition Arousal/Alertness: Awake/alert Behavior During Therapy: WFL for tasks assessed/performed Overall  Cognitive Status: Within Functional Limits for tasks assessed                      Exercises General Exercises - Lower Extremity Ankle Circles/Pumps: AROM;Both;10 reps;Supine Quad Sets: AROM;Both;20 reps;Supine Gluteal Sets: AROM;Both;10 reps;Supine Heel Slides: Both;10 reps;AAROM;Supine Hip ABduction/ADduction: AAROM;Both;10 reps;Supine Straight Leg Raises: AAROM;Both;10 reps;Supine    General Comments        Pertinent Vitals/Pain Pain Assessment: 0-10 Pain Score: 8  Pain Descriptors / Indicators: Aching Pain Intervention(s): Limited activity within patient's tolerance;Premedicated before session;Monitored during session;Repositioned    Home Living                      Prior Function            PT Goals (current goals can now be found in the care plan section) Acute Rehab PT Goals Patient Stated Goal: to feel better Potential to Achieve Goals: Fair Progress towards PT goals: Progressing toward goals    Frequency  Min 3X/week    PT Plan      Co-evaluation             End of Session Equipment Utilized During Treatment: Oxygen  Activity Tolerance: Patient limited by fatigue;Patient limited by pain Patient left: in bed;with call bell/phone within reach;with bed alarm set     Time: 1412-1446 PT Time Calculation (min) (ACUTE ONLY): 34 min  Charges:  $Therapeutic Exercise: 8-22 mins $Therapeutic Activity: 8-22 mins                    G Codes:      [name] Aug 23, 2015, 2:58 PM [name], PTA pager [phone]

## 2015-08-08 NOTE — Progress Notes (Signed)
OT Cancellation Note  Patient Details Name: [name] MRN: [id number] DOB: Feb 26, 1946   Cancelled Treatment:    Reason Eval/Treat Not Completed: Patient declined, no reason specified (Pt agitated.)  Malka So 08/08/2015, 3:26 PM

## 2015-08-09 ENCOUNTER — Ambulatory Visit (INDEPENDENT_AMBULATORY_CARE_PROVIDER_SITE_OTHER): Payer: Commercial Managed Care - HMO | Admitting: Internal Medicine

## 2015-08-09 ENCOUNTER — Encounter: Payer: Self-pay | Admitting: Internal Medicine

## 2015-08-09 VITALS — BP 118/74 | HR 87 | Ht 75.0 in

## 2015-08-09 DIAGNOSIS — I495 Sick sinus syndrome: Secondary | ICD-10-CM | POA: Diagnosis not present

## 2015-08-09 DIAGNOSIS — I4891 Unspecified atrial fibrillation: Secondary | ICD-10-CM | POA: Diagnosis not present

## 2015-08-09 DIAGNOSIS — I5042 Chronic combined systolic (congestive) and diastolic (congestive) heart failure: Secondary | ICD-10-CM | POA: Diagnosis not present

## 2015-08-09 DIAGNOSIS — I10 Essential (primary) hypertension: Secondary | ICD-10-CM | POA: Diagnosis not present

## 2015-08-09 DIAGNOSIS — J441 Chronic obstructive pulmonary disease with (acute) exacerbation: Secondary | ICD-10-CM | POA: Diagnosis not present

## 2015-08-09 DIAGNOSIS — I509 Heart failure, unspecified: Secondary | ICD-10-CM | POA: Diagnosis not present

## 2015-08-09 LAB — CUP PACEART INCLINIC DEVICE CHECK
Lead Channel Impedance Value: 395 Ohm
Lead Channel Pacing Threshold Pulse Width: 0.5 ms
Lead Channel Sensing Intrinsic Amplitude: 7.4 mV
MDC IDC LEAD IMPLANT DT: 20100129
MDC IDC LEAD LOCATION: 753860
MDC IDC MSMT BATTERY IMPEDANCE: 1000 Ohm — AB
MDC IDC MSMT BATTERY VOLTAGE: 2.79 V
MDC IDC MSMT LEADCHNL RV PACING THRESHOLD AMPLITUDE: 0.75 V
MDC IDC SESS DTM: 20170322150108
MDC IDC SET LEADCHNL RV PACING AMPLITUDE: 2.5 V
MDC IDC SET LEADCHNL RV PACING PULSEWIDTH: 0.5 ms
MDC IDC SET LEADCHNL RV SENSING SENSITIVITY: 2.5 mV
MDC IDC STAT BRADY RV PERCENT PACED: 1.3 %
Pulse Gen Model: 5626
Pulse Gen Serial Number: 2045834

## 2015-08-09 MED ORDER — METOPROLOL SUCCINATE ER 200 MG PO TB24: ORAL_TABLET | ORAL | Status: DC

## 2015-08-09 NOTE — Progress Notes (Signed)
PCP: Zada Finders, MD   [name] is a 70 y.o. male who presents today for electrophysiology followup.  Left the hospital yesterday (discharge summary reviewed).  Remains sob.  Continues to have difficulty with AF with RVR.  Very weak and tried.  Today, he denies symptoms of palpitations, chest pain,   lower extremity edema, presyncope, or syncope.  The patient is otherwise without complaint today.   Past Medical History  Diagnosis Date  . Hypertension   . Chronic systolic heart failure ([place])     a. NICM - EF 35% with normal cors 2003. b. Last echo 11/2012 - EF 25-30%.  . Depression   . GERD (gastroesophageal reflux disease)   . Portal vein thrombosis   . Avascular necrosis of bones of both hips ([facility])     Status post bilateral hip replacements  . Gastritis   . Alcohol abuse   . Erectile dysfunction   . Bipolar disorder ([place])   . Hydrocele, unspecified   . Lung nodule     Chest CT scan on 12/14/2008 showed a nodular opacity at the left lung base felt to most likely represent scarring.  Follow-up chest CT scan on 06/02/2010 showed parenchymal scarring in the left apex, left  lower lobe, and lingula; some of this scarring at the left base had a nodular appearance, unchanged. Chest 09/2012 - stable.  . 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  Thyroid scan 08/28/2009 was normal with no focal areas of abnormal increased or decreased activity seen; the uptake of I 131 sodium iodide at 24 hours was 5.7%.  TSH and free T4 normalized by 08/16/2009.  . Intestinal obstruction ([place])   . COPD (chronic obstructive pulmonary disease) ([place])   . E coli bacteremia   . DVT (deep venous thrombosis) ([place])     He has hypercoagulability with multiple prior DVTs  . Pleural plaque     H/o asbestos exposure. Chest CT on 06/02/2010 showed stable extensive calcified pleural plaques involving the left hemithorax, consistent with asbestos related pleural disease.  .  Weight loss     Normal colonoscopy by Dr. Olevia Perches on 11/06/2010.  . Tachycardia-bradycardia syndrome [facility])     a. 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 b. St. Jude gen change 2010.  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  . Noncompliance   . Thrombocytopenia ([place])   . Osteoarthritis   . Spondylosis   . Anxiety   . CKD (chronic kidney disease)     Renal U/S 12/04/2009 showed no pathological findings. Labs 12/04/2009 include normal ESR, C3, C4; neg ANA; SPEP showed nonspecific increase in the alpha-2 region with no M-spike; UPEP showed no monoclonal free light chains; urine IFE showed polyclonal increase in feree Kappa and/or free Lambda light chains. Baseline Cr reported 1.7-2.5.  . STEMI (ST elevation myocardial infarction) ([place]) 10/11/2014  . Hyperlipemia    Past Surgical History  Procedure Laterality Date  . Total hip arthroplasty Right 03/08/2008     By Dr. Hiram Comber  . Tee without cardioversion N/A 05/27/2013    Procedure: TRANSESOPHAGEAL ECHOCARDIOGRAM (TEE);  Surgeon: Candee Furbish, MD;  Location: [facility] ENDOSCOPY;  Service: Cardiovascular;  Laterality: N/A;  . Cardioversion N/A 05/27/2013    Procedure: CARDIOVERSION;  Surgeon: Candee Furbish, MD;  Location: [facility] ENDOSCOPY;  Service: Cardiovascular;  Laterality: N/A;  . Cardiac catheterization  2003  Nl cors, EF 35%  . Embolectomy Right 12/10/2013    Procedure: Right Femoral Embolectomy; Fasciotomy Right Lower Leg with Intraoperative arteriogram;  Surgeon: Todd F Early, MD;  Location: [facility] OR;  Service: Vascular;  Laterality: Right;  . Intraoperative arteriogram Right 12/10/2013    Procedure: INTRA OPERATIVE ARTERIOGRAM;  Surgeon: Todd F Early, MD;  Location: [facility] OR;  Service: Vascular;  Laterality: Right;  . Fasciotomy closure Right 12/15/2013    Procedure: LEG FASCIOTOMY CLOSURE;  Surgeon: Todd F Early, MD;  Location: [facility] OR;  Service: Vascular;   Laterality: Right;  . Embolectomy Left 10/12/2014    Procedure: Left Femoral Thrombectomy with intraoperative arteriogram;  Surgeon: Todd F Early, MD;  Location: [facility] OR;  Service: Vascular;  Laterality: Left;  . Hemiarthroplasty hip Left 09/09/2002    bipolar; Dr. Philip Carter  . Joint replacement    . Insert / replace / remove pacemaker  02/2003    Dr. John Tysinger, St. Jude Medical pulse generator identity SR model #5172 serial #[id number]; gen change by JA  . Insert / replace / remove pacemaker  06/17/2008    Lead and generator change w/ St. Jude Medical Tendril ST model 1788TC-58 (serial  #[id number]).         ROS- all systems are reviewed and negative except as per HPI above  Current Outpatient Prescriptions  Medication Sig Dispense Refill  . albuterol (VENTOLIN HFA) 108 (90 BASE) MCG/ACT inhaler Inhale 2 puffs into the lungs every 6 (six) hours as needed for wheezing or shortness of breath. 3 Inhaler 3  . apixaban (ELIQUIS) 2.5 MG TABS tablet Take 1 tablet (2.5 mg total) by mouth 2 (two) times daily. 60 tablet 3  . aspirin 81 MG chewable tablet Chew 1 tablet (81 mg total) by mouth daily. 30 tablet 12  . bisacodyl (DULCOLAX) 10 MG suppository Place 1 suppository (10 mg total) rectally daily as needed for severe constipation. 12 suppository 5  . budesonide (PULMICORT) 0.25 MG/2ML nebulizer solution Take 2 mLs (0.25 mg total) by nebulization 2 (two) times daily. 60 mL 12  . buPROPion (WELLBUTRIN) 75 MG tablet Take 1 tablet (75 mg total) by mouth 2 (two) times daily. 180 tablet 3  . gabapentin (NEURONTIN) 300 MG capsule TAKE ONE CAPSULE BY MOUTH 3 TIMES A DAY 60 capsule 2  . metoprolol (TOPROL-XL) 200 MG 24 hr tablet Take 2 tablets (200 mg total) in the morning.  Take 1 tablet (100 mg total) in the evening. Take with or immediately following a meal. 90 tablet 3  . mirtazapine (REMERON) 15 MG tablet TAKE 1 TABLET BY MOUTH AT BEDTIME 30 tablet 5  . Naloxone HCl (EVZIO) 0.4 MG/0.4ML SOAJ Inject 0.4  mLs as directed as needed (opioid overdose). For Steve Furr 1 Package 1  . omeprazole (PRILOSEC) 40 MG capsule Take 1 capsule (40 mg total) by mouth daily. 90 capsule 3  . oxyCODONE-acetaminophen (PERCOCET) 10-325 MG tablet Take 1 tablet by mouth every 4 (four) hours as needed for pain. Do not take more than 5 doses in a 24 hour period. 150 tablet 0  . predniSONE (DELTASONE) 10 MG tablet Take 1 tablet (10 mg total) by mouth daily with breakfast. 1 tablet 0  . rosuvastatin (CRESTOR) 20 MG tablet Take 1 tablet (20 mg total) by mouth daily at 6 PM. 90 tablet 3  . senna-docusate ([employer] SENNA PLUS) 8.6-50 MG tablet Take 1 tablet by mouth daily as needed for moderate constipation. 360 tablet 3  . tiotropium (SPIRIVA) 18   MCG inhalation capsule Place 1 capsule (18 mcg total) into inhaler and inhale daily. 90 capsule 3  . Urea 37.5 % CREA Apply 1 application topically daily as needed (for irritation). 142 g 0   No current facility-administered medications for this visit.    Physical Exam: Filed Vitals:   08/09/15 1421  BP: 118/74  Pulse: 87  Height: 6' 3" (1.905 m)  SpO2: 81%    GEN- The patient is chronically ill appearing, alert and oriented x 3 today.   Head- normocephalic, atraumatic Eyes-  Sclera clear, conjunctiva pink Ears- hearing intact Oropharynx- clear Lungs- Clear to ausculation bilaterally, normal work of breathing Chest- pacemaker pocket is well healed Heart- tachycardic irregular rhythm GI- soft, NT, ND, + BS Extremities- no clubbing, cyanosis, or edema  Pacemaker interrogation- reviewed in detail today,  See PACEART report ekg today reveals afib, V rates 134 bpm, nonspecific ST/T changes  Assessment and Plan:  1. Bradycardia/ tachycardia syndrome Normal pacemaker function See Pace Art report No changes today  2. afib Permanent He has failed multaq, tikosyn, and amiodarone Unfortunately rate control has been a challenge. Will increase toprol to 22m qam, 1034m qpm Stop digoxin given renal failure Continue anticoagulation long term  3. Chronic systolic dysfunction Likely tachycardia mediated unfortunately given that he has failed medical therapy and has venous occlusions, our options are limited Given recurrent infections and bacteremia, I would not advise AV nodal ablation for this patient. May consider referral for epicardial maze procedure vs referral to tertiary care center if we cannot rate control with changes today  Return in 1 week to see me  This is a very challenging situation in a patient at high risk for decompensation/ hospitalization.  A high level of decision making is required for the visit today.  S.ig

## 2015-08-09 NOTE — Patient Instructions (Signed)
Medication Instructions:  Your physician has recommended you make the following change in your medication:  1) STOP Digoxin 2) INCREASE Toprol to -- take 2 tablets in the morning and take 1 tablet in the evening.  Labwork: None ordered  Testing/Procedures: None ordered  Follow-Up: Your physician follow-up on: 08/16/15 at 12:15 pm with Dr. Rayann Heman  If you need a refill on your cardiac medications before your next appointment, please call your pharmacy.  Thank you for choosing [facility]!!

## 2015-08-10 DIAGNOSIS — N183 Chronic kidney disease, stage 3 (moderate): Secondary | ICD-10-CM | POA: Diagnosis not present

## 2015-08-10 DIAGNOSIS — I10 Essential (primary) hypertension: Secondary | ICD-10-CM | POA: Diagnosis not present

## 2015-08-10 DIAGNOSIS — I4891 Unspecified atrial fibrillation: Secondary | ICD-10-CM | POA: Diagnosis not present

## 2015-08-10 DIAGNOSIS — J441 Chronic obstructive pulmonary disease with (acute) exacerbation: Secondary | ICD-10-CM | POA: Diagnosis not present

## 2015-08-10 LAB — CULTURE, BLOOD (ROUTINE X 2)
CULTURE: NO GROWTH
Culture: NO GROWTH

## 2015-08-11 DIAGNOSIS — I10 Essential (primary) hypertension: Secondary | ICD-10-CM | POA: Diagnosis not present

## 2015-08-11 DIAGNOSIS — J189 Pneumonia, unspecified organism: Secondary | ICD-10-CM | POA: Diagnosis not present

## 2015-08-11 DIAGNOSIS — E43 Unspecified severe protein-calorie malnutrition: Secondary | ICD-10-CM | POA: Diagnosis not present

## 2015-08-11 DIAGNOSIS — I4891 Unspecified atrial fibrillation: Secondary | ICD-10-CM | POA: Diagnosis not present

## 2015-08-11 DIAGNOSIS — N183 Chronic kidney disease, stage 3 (moderate): Secondary | ICD-10-CM | POA: Diagnosis not present

## 2015-08-11 DIAGNOSIS — E875 Hyperkalemia: Secondary | ICD-10-CM | POA: Diagnosis not present

## 2015-08-11 DIAGNOSIS — K567 Ileus, unspecified: Secondary | ICD-10-CM | POA: Diagnosis not present

## 2015-08-11 DIAGNOSIS — I5020 Unspecified systolic (congestive) heart failure: Secondary | ICD-10-CM | POA: Diagnosis not present

## 2015-08-11 DIAGNOSIS — J449 Chronic obstructive pulmonary disease, unspecified: Secondary | ICD-10-CM | POA: Diagnosis not present

## 2015-08-11 NOTE — Patient Outreach (Signed)
[facility]) Care Management  08/11/2015  RAMONT KINCHELOE 09/23/1945 [id number]   Warson Woods, LCSW to follow patient while in SNF.  Thanks, [name]

## 2015-08-11 NOTE — Addendum Note (Signed)
Addended by: Freada Bergeron on: 08/11/2015 09:14 AM   Modules accepted: Orders

## 2015-08-12 ENCOUNTER — Inpatient Hospital Stay (HOSPITAL_COMMUNITY): Payer: Commercial Managed Care - HMO

## 2015-08-12 ENCOUNTER — Emergency Department (HOSPITAL_COMMUNITY): Payer: Commercial Managed Care - HMO

## 2015-08-12 ENCOUNTER — Encounter (HOSPITAL_COMMUNITY): Payer: Self-pay | Admitting: Emergency Medicine

## 2015-08-12 ENCOUNTER — Inpatient Hospital Stay (HOSPITAL_COMMUNITY)
Admission: EM | Admit: 2015-08-12 | Discharge: 2015-08-18 | DRG: 871 | Disposition: A | Discharge status: Skilled Nursing Facility | Payer: Commercial Managed Care - HMO | Attending: Oncology | Admitting: Oncology

## 2015-08-12 DIAGNOSIS — Z96641 Presence of right artificial hip joint: Secondary | ICD-10-CM | POA: Diagnosis present

## 2015-08-12 DIAGNOSIS — Z7982 Long term (current) use of aspirin: Secondary | ICD-10-CM | POA: Diagnosis not present

## 2015-08-12 DIAGNOSIS — A419 Sepsis, unspecified organism: Secondary | ICD-10-CM | POA: Diagnosis not present

## 2015-08-12 DIAGNOSIS — E870 Hyperosmolality and hypernatremia: Secondary | ICD-10-CM | POA: Diagnosis present

## 2015-08-12 DIAGNOSIS — M6281 Muscle weakness (generalized): Secondary | ICD-10-CM | POA: Diagnosis not present

## 2015-08-12 DIAGNOSIS — I248 Other forms of acute ischemic heart disease: Secondary | ICD-10-CM | POA: Diagnosis present

## 2015-08-12 DIAGNOSIS — Y95 Nosocomial condition: Secondary | ICD-10-CM | POA: Diagnosis present

## 2015-08-12 DIAGNOSIS — R579 Shock, unspecified: Secondary | ICD-10-CM | POA: Diagnosis present

## 2015-08-12 DIAGNOSIS — F319 Bipolar disorder, unspecified: Secondary | ICD-10-CM | POA: Diagnosis present

## 2015-08-12 DIAGNOSIS — J440 Chronic obstructive pulmonary disease with acute lower respiratory infection: Secondary | ICD-10-CM | POA: Diagnosis not present

## 2015-08-12 DIAGNOSIS — Z681 Body mass index (BMI) 19 or less, adult: Secondary | ICD-10-CM

## 2015-08-12 DIAGNOSIS — Z79899 Other long term (current) drug therapy: Secondary | ICD-10-CM | POA: Diagnosis not present

## 2015-08-12 DIAGNOSIS — Z86718 Personal history of other venous thrombosis and embolism: Secondary | ICD-10-CM | POA: Diagnosis not present

## 2015-08-12 DIAGNOSIS — I420 Dilated cardiomyopathy: Secondary | ICD-10-CM | POA: Diagnosis present

## 2015-08-12 DIAGNOSIS — I4891 Unspecified atrial fibrillation: Secondary | ICD-10-CM | POA: Diagnosis present

## 2015-08-12 DIAGNOSIS — T82594A Other mechanical complication of infusion catheter, initial encounter: Secondary | ICD-10-CM

## 2015-08-12 DIAGNOSIS — Z96642 Presence of left artificial hip joint: Secondary | ICD-10-CM | POA: Diagnosis present

## 2015-08-12 DIAGNOSIS — J9600 Acute respiratory failure, unspecified whether with hypoxia or hypercapnia: Secondary | ICD-10-CM | POA: Diagnosis not present

## 2015-08-12 DIAGNOSIS — I252 Old myocardial infarction: Secondary | ICD-10-CM

## 2015-08-12 DIAGNOSIS — J9601 Acute respiratory failure with hypoxia: Secondary | ICD-10-CM | POA: Diagnosis not present

## 2015-08-12 DIAGNOSIS — E440 Moderate protein-calorie malnutrition: Secondary | ICD-10-CM | POA: Diagnosis present

## 2015-08-12 DIAGNOSIS — E875 Hyperkalemia: Secondary | ICD-10-CM | POA: Diagnosis present

## 2015-08-12 DIAGNOSIS — L8990 Pressure ulcer of unspecified site, unspecified stage: Secondary | ICD-10-CM | POA: Insufficient documentation

## 2015-08-12 DIAGNOSIS — T82868A Thrombosis of vascular prosthetic devices, implants and grafts, initial encounter: Secondary | ICD-10-CM | POA: Diagnosis not present

## 2015-08-12 DIAGNOSIS — D689 Coagulation defect, unspecified: Secondary | ICD-10-CM | POA: Insufficient documentation

## 2015-08-12 DIAGNOSIS — R2681 Unsteadiness on feet: Secondary | ICD-10-CM | POA: Diagnosis not present

## 2015-08-12 DIAGNOSIS — I5022 Chronic systolic (congestive) heart failure: Secondary | ICD-10-CM | POA: Diagnosis present

## 2015-08-12 DIAGNOSIS — Z452 Encounter for adjustment and management of vascular access device: Secondary | ICD-10-CM

## 2015-08-12 DIAGNOSIS — Z950 Presence of cardiac pacemaker: Secondary | ICD-10-CM | POA: Diagnosis not present

## 2015-08-12 DIAGNOSIS — R0602 Shortness of breath: Secondary | ICD-10-CM | POA: Diagnosis not present

## 2015-08-12 DIAGNOSIS — Z79891 Long term (current) use of opiate analgesic: Secondary | ICD-10-CM

## 2015-08-12 DIAGNOSIS — I2510 Atherosclerotic heart disease of native coronary artery without angina pectoris: Secondary | ICD-10-CM | POA: Diagnosis not present

## 2015-08-12 DIAGNOSIS — E872 Acidosis: Secondary | ICD-10-CM | POA: Diagnosis present

## 2015-08-12 DIAGNOSIS — F339 Major depressive disorder, recurrent, unspecified: Secondary | ICD-10-CM | POA: Diagnosis not present

## 2015-08-12 DIAGNOSIS — Z7901 Long term (current) use of anticoagulants: Secondary | ICD-10-CM | POA: Diagnosis not present

## 2015-08-12 DIAGNOSIS — J90 Pleural effusion, not elsewhere classified: Secondary | ICD-10-CM | POA: Diagnosis not present

## 2015-08-12 DIAGNOSIS — Z87891 Personal history of nicotine dependence: Secondary | ICD-10-CM

## 2015-08-12 DIAGNOSIS — Z8614 Personal history of Methicillin resistant Staphylococcus aureus infection: Secondary | ICD-10-CM | POA: Diagnosis not present

## 2015-08-12 DIAGNOSIS — Z888 Allergy status to other drugs, medicaments and biological substances status: Secondary | ICD-10-CM

## 2015-08-12 DIAGNOSIS — I130 Hypertensive heart and chronic kidney disease with heart failure and stage 1 through stage 4 chronic kidney disease, or unspecified chronic kidney disease: Secondary | ICD-10-CM | POA: Diagnosis not present

## 2015-08-12 DIAGNOSIS — Z4682 Encounter for fitting and adjustment of non-vascular catheter: Secondary | ICD-10-CM | POA: Diagnosis not present

## 2015-08-12 DIAGNOSIS — N183 Chronic kidney disease, stage 3 (moderate): Secondary | ICD-10-CM | POA: Diagnosis present

## 2015-08-12 DIAGNOSIS — I481 Persistent atrial fibrillation: Secondary | ICD-10-CM | POA: Diagnosis present

## 2015-08-12 DIAGNOSIS — Z7951 Long term (current) use of inhaled steroids: Secondary | ICD-10-CM

## 2015-08-12 DIAGNOSIS — D72829 Elevated white blood cell count, unspecified: Secondary | ICD-10-CM | POA: Diagnosis not present

## 2015-08-12 DIAGNOSIS — G9340 Encephalopathy, unspecified: Secondary | ICD-10-CM | POA: Diagnosis not present

## 2015-08-12 DIAGNOSIS — J9602 Acute respiratory failure with hypercapnia: Secondary | ICD-10-CM | POA: Diagnosis present

## 2015-08-12 DIAGNOSIS — T82199A Other mechanical complication of unspecified cardiac device, initial encounter: Secondary | ICD-10-CM | POA: Diagnosis not present

## 2015-08-12 DIAGNOSIS — R079 Chest pain, unspecified: Secondary | ICD-10-CM | POA: Diagnosis not present

## 2015-08-12 DIAGNOSIS — I480 Paroxysmal atrial fibrillation: Secondary | ICD-10-CM | POA: Diagnosis not present

## 2015-08-12 DIAGNOSIS — E46 Unspecified protein-calorie malnutrition: Secondary | ICD-10-CM | POA: Diagnosis not present

## 2015-08-12 DIAGNOSIS — J189 Pneumonia, unspecified organism: Secondary | ICD-10-CM | POA: Diagnosis not present

## 2015-08-12 DIAGNOSIS — J441 Chronic obstructive pulmonary disease with (acute) exacerbation: Secondary | ICD-10-CM | POA: Diagnosis not present

## 2015-08-12 DIAGNOSIS — E785 Hyperlipidemia, unspecified: Secondary | ICD-10-CM | POA: Diagnosis not present

## 2015-08-12 DIAGNOSIS — J9621 Acute and chronic respiratory failure with hypoxia: Secondary | ICD-10-CM | POA: Diagnosis not present

## 2015-08-12 DIAGNOSIS — I10 Essential (primary) hypertension: Secondary | ICD-10-CM | POA: Diagnosis not present

## 2015-08-12 DIAGNOSIS — J9622 Acute and chronic respiratory failure with hypercapnia: Secondary | ICD-10-CM | POA: Diagnosis not present

## 2015-08-12 DIAGNOSIS — K219 Gastro-esophageal reflux disease without esophagitis: Secondary | ICD-10-CM | POA: Diagnosis not present

## 2015-08-12 DIAGNOSIS — Z880 Allergy status to penicillin: Secondary | ICD-10-CM

## 2015-08-12 LAB — I-STAT ARTERIAL BLOOD GAS, ED
Acid-base deficit: 5 mmol/L — ABNORMAL HIGH (ref 0.0–2.0)
BICARBONATE: 22.5 meq/L (ref 20.0–24.0)
O2 SAT: 100 %
PCO2 ART: 50.1 mmHg — AB (ref 35.0–45.0)
Patient temperature: 99.8
TCO2: 24 mmol/L (ref 0–100)
pH, Arterial: 7.263 — ABNORMAL LOW (ref 7.350–7.450)
pO2, Arterial: 405 mmHg — ABNORMAL HIGH (ref 80.0–100.0)

## 2015-08-12 LAB — HEPARIN LEVEL (UNFRACTIONATED): HEPARIN UNFRACTIONATED: 1.08 [IU]/mL — AB (ref 0.30–0.70)

## 2015-08-12 LAB — CBC WITH DIFFERENTIAL/PLATELET
BASOS ABS: 0 10*3/uL (ref 0.0–0.1)
Basophils Relative: 0 %
Eosinophils Absolute: 0.3 10*3/uL (ref 0.0–0.7)
Eosinophils Relative: 2 %
HEMATOCRIT: 44.3 % (ref 39.0–52.0)
Hemoglobin: 13.8 g/dL (ref 13.0–17.0)
LYMPHS ABS: 2.5 10*3/uL (ref 0.7–4.0)
LYMPHS PCT: 16 %
MCH: 29.9 pg (ref 26.0–34.0)
MCHC: 31.2 g/dL (ref 30.0–36.0)
MCV: 96.1 fL (ref 78.0–100.0)
MONO ABS: 1.2 10*3/uL — AB (ref 0.1–1.0)
MONOS PCT: 8 %
NEUTROS ABS: 11.4 10*3/uL — AB (ref 1.7–7.7)
Neutrophils Relative %: 74 %
PLATELETS: 177 10*3/uL (ref 150–400)
RBC: 4.61 MIL/uL (ref 4.22–5.81)
RDW: 15.9 % — AB (ref 11.5–15.5)
WBC: 15.3 10*3/uL — ABNORMAL HIGH (ref 4.0–10.5)

## 2015-08-12 LAB — COMPREHENSIVE METABOLIC PANEL
ALT: 91 U/L — AB (ref 17–63)
AST: 41 U/L (ref 15–41)
Albumin: 2.8 g/dL — ABNORMAL LOW (ref 3.5–5.0)
Alkaline Phosphatase: 70 U/L (ref 38–126)
Anion gap: 8 (ref 5–15)
BILIRUBIN TOTAL: 0.5 mg/dL (ref 0.3–1.2)
BUN: 54 mg/dL — AB (ref 6–20)
CO2: 23 mmol/L (ref 22–32)
CREATININE: 1.86 mg/dL — AB (ref 0.61–1.24)
Calcium: 8.4 mg/dL — ABNORMAL LOW (ref 8.9–10.3)
Chloride: 116 mmol/L — ABNORMAL HIGH (ref 101–111)
GFR, EST AFRICAN AMERICAN: 40 mL/min — AB (ref >60)
GFR, EST NON AFRICAN AMERICAN: 35 mL/min — AB (ref >60)
Glucose, Bld: 107 mg/dL — ABNORMAL HIGH (ref 65–99)
Potassium: 5.8 mmol/L — ABNORMAL HIGH (ref 3.5–5.1)
Sodium: 147 mmol/L — ABNORMAL HIGH (ref 135–145)
TOTAL PROTEIN: 5.5 g/dL — AB (ref 6.5–8.1)

## 2015-08-12 LAB — APTT
APTT: 36 s (ref 24–37)
APTT: 67 s — AB (ref 24–37)

## 2015-08-12 LAB — URINALYSIS, ROUTINE W REFLEX MICROSCOPIC
BILIRUBIN URINE: NEGATIVE
GLUCOSE, UA: NEGATIVE mg/dL
HGB URINE DIPSTICK: NEGATIVE
KETONES UR: NEGATIVE mg/dL
LEUKOCYTES UA: NEGATIVE
Nitrite: NEGATIVE
Protein, ur: 30 mg/dL — AB
Specific Gravity, Urine: 1.026 (ref 1.005–1.030)
pH: 5.5 (ref 5.0–8.0)

## 2015-08-12 LAB — LIPASE, BLOOD
LIPASE: 33 U/L (ref 11–51)
Lipase: 33 U/L (ref 11–51)

## 2015-08-12 LAB — PHOSPHORUS: Phosphorus: 4.6 mg/dL (ref 2.5–4.6)

## 2015-08-12 LAB — URINE MICROSCOPIC-ADD ON: Bacteria, UA: NONE SEEN

## 2015-08-12 LAB — CBG MONITORING, ED
Glucose-Capillary: 104 mg/dL — ABNORMAL HIGH (ref 65–99)
Glucose-Capillary: 83 mg/dL (ref 65–99)

## 2015-08-12 LAB — I-STAT CG4 LACTIC ACID, ED
Lactic Acid, Venous: 0.91 mmol/L (ref 0.5–2.0)
Lactic Acid, Venous: 2.02 mmol/L (ref 0.5–2.0)

## 2015-08-12 LAB — STREP PNEUMONIAE URINARY ANTIGEN: STREP PNEUMO URINARY ANTIGEN: NEGATIVE

## 2015-08-12 LAB — TROPONIN I
TROPONIN I: 0.07 ng/mL — AB (ref <0.031)
Troponin I: 0.09 ng/mL — ABNORMAL HIGH (ref <0.031)
Troponin I: 0.08 ng/mL — ABNORMAL HIGH (ref <0.031)

## 2015-08-12 LAB — I-STAT TROPONIN, ED: TROPONIN I, POC: 0.11 ng/mL — AB (ref 0.00–0.08)

## 2015-08-12 LAB — BRAIN NATRIURETIC PEPTIDE: B Natriuretic Peptide: 1014 pg/mL — ABNORMAL HIGH (ref 0.0–100.0)

## 2015-08-12 LAB — DIGOXIN LEVEL: Digoxin Level: 0.5 ng/mL — ABNORMAL LOW (ref 0.8–2.0)

## 2015-08-12 LAB — MAGNESIUM: Magnesium: 1.6 mg/dL — ABNORMAL LOW (ref 1.7–2.4)

## 2015-08-12 MED ORDER — METOPROLOL TARTRATE 1 MG/ML IV SOLN
5.0000 mg | Freq: Once | INTRAVENOUS | Status: AC
  Administered 2015-08-12: 5 mg via INTRAVENOUS

## 2015-08-12 MED ORDER — FENTANYL CITRATE (PF) 100 MCG/2ML IJ SOLN
50.0000 ug | Freq: Once | INTRAMUSCULAR | Status: AC
  Administered 2015-08-12: 50 ug via INTRAVENOUS

## 2015-08-12 MED ORDER — SODIUM CHLORIDE 0.9 % IV SOLN
1250.0000 mg | Freq: Once | INTRAVENOUS | Status: AC
  Administered 2015-08-12: 1250 mg via INTRAVENOUS
  Filled 2015-08-12: qty 1250

## 2015-08-12 MED ORDER — METOPROLOL TARTRATE 25 MG PO TABS
25.0000 mg | ORAL_TABLET | Freq: Two times a day (BID) | ORAL | Status: DC
  Administered 2015-08-12 – 2015-08-14 (×4): 25 mg via ORAL
  Filled 2015-08-12 (×7): qty 1

## 2015-08-12 MED ORDER — SODIUM CHLORIDE 0.9 % IV SOLN
25.0000 ug/h | INTRAVENOUS | Status: DC
  Filled 2015-08-12: qty 50

## 2015-08-12 MED ORDER — PHENYLEPHRINE HCL 10 MG/ML IJ SOLN
0.0000 ug/min | INTRAMUSCULAR | Status: DC
  Administered 2015-08-12: 15 ug/min via INTRAVENOUS
  Filled 2015-08-12: qty 1

## 2015-08-12 MED ORDER — FAMOTIDINE 40 MG/5ML PO SUSR: 20.0000 mg | Freq: Every day | ORAL | Status: DC

## 2015-08-12 MED ORDER — CEFEPIME HCL 2 G IJ SOLR
2.0000 g | Freq: Once | INTRAMUSCULAR | Status: AC
  Administered 2015-08-12: 2 g via INTRAVENOUS
  Filled 2015-08-12: qty 2

## 2015-08-12 MED ORDER — BISACODYL 10 MG RE SUPP
10.0000 mg | Freq: Every day | RECTAL | Status: DC | PRN
  Administered 2015-08-16: 10 mg via RECTAL
  Filled 2015-08-12: qty 1

## 2015-08-12 MED ORDER — DEXTROSE 5 % IV SOLN
5.0000 mg/h | INTRAVENOUS | Status: DC
  Administered 2015-08-12: 12.5 mg/h via INTRAVENOUS
  Administered 2015-08-12: 5 mg/h via INTRAVENOUS
  Administered 2015-08-13 (×3): 12.5 mg/h via INTRAVENOUS
  Administered 2015-08-14: 5 mg/h via INTRAVENOUS
  Administered 2015-08-14: 15 mg/h via INTRAVENOUS
  Administered 2015-08-14: 12.5 mg/h via INTRAVENOUS
  Filled 2015-08-12 (×8): qty 100

## 2015-08-12 MED ORDER — FENTANYL CITRATE (PF) 100 MCG/2ML IJ SOLN
50.0000 ug | INTRAMUSCULAR | Status: DC | PRN
  Administered 2015-08-12: 50 ug via INTRAVENOUS
  Filled 2015-08-12 (×3): qty 2

## 2015-08-12 MED ORDER — METOPROLOL TARTRATE 1 MG/ML IV SOLN
10.0000 mg | Freq: Once | INTRAVENOUS | Status: AC
  Administered 2015-08-12: 10 mg via INTRAVENOUS
  Filled 2015-08-12 (×2): qty 10

## 2015-08-12 MED ORDER — ETOMIDATE 2 MG/ML IV SOLN
10.0000 mg | Freq: Once | INTRAVENOUS | Status: AC
  Administered 2015-08-12: 10 mg via INTRAVENOUS

## 2015-08-12 MED ORDER — SODIUM CHLORIDE 0.9 % IV SOLN: 250.0000 mL | INTRAVENOUS | Status: DC | PRN

## 2015-08-12 MED ORDER — DEXTROSE 5 % IV SOLN
2.0000 g | INTRAVENOUS | Status: AC
  Administered 2015-08-13 – 2015-08-16 (×4): 2 g via INTRAVENOUS
  Filled 2015-08-12 (×5): qty 2

## 2015-08-12 MED ORDER — BUDESONIDE 0.25 MG/2ML IN SUSP
0.2500 mg | Freq: Two times a day (BID) | RESPIRATORY_TRACT | Status: DC
  Administered 2015-08-12 – 2015-08-13 (×3): 0.25 mg via RESPIRATORY_TRACT
  Filled 2015-08-12 (×3): qty 2

## 2015-08-12 MED ORDER — ROCURONIUM BROMIDE 50 MG/5ML IV SOLN
80.0000 mg | Freq: Once | INTRAVENOUS | Status: AC
  Administered 2015-08-12: 80 mg via INTRAVENOUS
  Filled 2015-08-12: qty 8

## 2015-08-12 MED ORDER — SENNOSIDES-DOCUSATE SODIUM 8.6-50 MG PO TABS
1.0000 | ORAL_TABLET | Freq: Every day | ORAL | Status: DC | PRN
  Administered 2015-08-18: 1 via ORAL
  Filled 2015-08-12 (×2): qty 1

## 2015-08-12 MED ORDER — SODIUM CHLORIDE 0.9 % IV BOLUS (SEPSIS)
1000.0000 mL | INTRAVENOUS | Status: AC
  Administered 2015-08-12 (×2): 1000 mL via INTRAVENOUS

## 2015-08-12 MED ORDER — CHLORHEXIDINE GLUCONATE 0.12% ORAL RINSE (MEDLINE KIT)
15.0000 mL | Freq: Two times a day (BID) | OROMUCOSAL | Status: DC
  Administered 2015-08-12 – 2015-08-14 (×6): 15 mL via OROMUCOSAL

## 2015-08-12 MED ORDER — METOPROLOL TARTRATE 1 MG/ML IV SOLN
2.5000 mg | INTRAVENOUS | Status: DC | PRN
  Administered 2015-08-12 – 2015-08-15 (×2): 2.5 mg via INTRAVENOUS
  Filled 2015-08-12 (×3): qty 5

## 2015-08-12 MED ORDER — PREDNISONE 20 MG PO TABS
40.0000 mg | ORAL_TABLET | Freq: Every day | ORAL | Status: DC
  Administered 2015-08-13: 40 mg via ORAL
  Filled 2015-08-12: qty 2

## 2015-08-12 MED ORDER — IPRATROPIUM BROMIDE 0.02 % IN SOLN
0.5000 mg | Freq: Four times a day (QID) | RESPIRATORY_TRACT | Status: DC
  Administered 2015-08-12 – 2015-08-14 (×9): 0.5 mg via RESPIRATORY_TRACT
  Filled 2015-08-12 (×9): qty 2.5

## 2015-08-12 MED ORDER — PHENYLEPHRINE 40 MCG/ML (10ML) SYRINGE FOR IV PUSH (FOR BLOOD PRESSURE SUPPORT): 80.0000 ug | PREFILLED_SYRINGE | Freq: Once | INTRAVENOUS | Status: DC

## 2015-08-12 MED ORDER — ENOXAPARIN SODIUM 40 MG/0.4ML ~~LOC~~ SOLN: 40.0000 mg | SUBCUTANEOUS | Status: DC

## 2015-08-12 MED ORDER — HEPARIN (PORCINE) IN NACL 100-0.45 UNIT/ML-% IJ SOLN
1200.0000 [IU]/h | INTRAMUSCULAR | Status: DC
  Administered 2015-08-12 – 2015-08-14 (×3): 1000 [IU]/h via INTRAVENOUS
  Filled 2015-08-12 (×5): qty 250

## 2015-08-12 MED ORDER — ANTISEPTIC ORAL RINSE SOLUTION (CORINZ)
7.0000 mL | Freq: Four times a day (QID) | OROMUCOSAL | Status: DC
  Administered 2015-08-12 – 2015-08-15 (×10): 7 mL via OROMUCOSAL

## 2015-08-12 MED ORDER — MIDAZOLAM HCL 2 MG/2ML IJ SOLN
1.0000 mg | INTRAMUSCULAR | Status: DC | PRN
  Administered 2015-08-13 – 2015-08-14 (×3): 1 mg via INTRAVENOUS
  Filled 2015-08-12 (×3): qty 2

## 2015-08-12 MED ORDER — BUPROPION HCL 75 MG PO TABS
75.0000 mg | ORAL_TABLET | Freq: Two times a day (BID) | ORAL | Status: DC
  Administered 2015-08-12 – 2015-08-18 (×13): 75 mg via ORAL
  Filled 2015-08-12 (×18): qty 1

## 2015-08-12 MED ORDER — SODIUM CHLORIDE 0.9 % IV BOLUS (SEPSIS)
500.0000 mL | INTRAVENOUS | Status: AC
  Administered 2015-08-12: 500 mL via INTRAVENOUS

## 2015-08-12 MED ORDER — SODIUM CHLORIDE 0.9 % IV BOLUS (SEPSIS)
1000.0000 mL | Freq: Once | INTRAVENOUS | Status: AC
  Administered 2015-08-12: 1000 mL via INTRAVENOUS

## 2015-08-12 MED ORDER — DEXTROSE 5 % IV SOLN
2.0000 ug/min | INTRAVENOUS | Status: DC
  Administered 2015-08-12: 2 ug/min via INTRAVENOUS
  Filled 2015-08-12: qty 4

## 2015-08-12 MED ORDER — VITAMIN D 1000 UNITS PO TABS
1000.0000 [IU] | ORAL_TABLET | Freq: Every day | ORAL | Status: DC
  Administered 2015-08-12 – 2015-08-18 (×7): 1000 [IU] via ORAL
  Filled 2015-08-12 (×9): qty 1

## 2015-08-12 MED ORDER — FAMOTIDINE 40 MG/5ML PO SUSR
20.0000 mg | Freq: Two times a day (BID) | ORAL | Status: DC
  Administered 2015-08-12: 20 mg
  Filled 2015-08-12: qty 2.5

## 2015-08-12 MED ORDER — METHYLPREDNISOLONE SODIUM SUCC 40 MG IJ SOLR
40.0000 mg | Freq: Once | INTRAMUSCULAR | Status: AC
  Administered 2015-08-12: 40 mg via INTRAVENOUS
  Filled 2015-08-12: qty 1

## 2015-08-12 MED ORDER — FENTANYL BOLUS VIA INFUSION
25.0000 ug | INTRAVENOUS | Status: DC | PRN
  Filled 2015-08-12: qty 25

## 2015-08-12 MED ORDER — FENTANYL CITRATE (PF) 2500 MCG/50ML IJ SOLN
5.0000 ug/h | INTRAMUSCULAR | Status: DC
  Filled 2015-08-12: qty 50

## 2015-08-12 MED ORDER — MIDAZOLAM HCL 2 MG/2ML IJ SOLN
1.0000 mg | INTRAMUSCULAR | Status: DC | PRN
  Administered 2015-08-13: 1 mg via INTRAVENOUS

## 2015-08-12 MED ORDER — LEVALBUTEROL HCL 0.63 MG/3ML IN NEBU
0.6300 mg | INHALATION_SOLUTION | Freq: Four times a day (QID) | RESPIRATORY_TRACT | Status: DC
  Administered 2015-08-12 – 2015-08-14 (×9): 0.63 mg via RESPIRATORY_TRACT
  Filled 2015-08-12 (×9): qty 3

## 2015-08-12 MED ORDER — ASPIRIN 81 MG PO CHEW
81.0000 mg | CHEWABLE_TABLET | Freq: Every day | ORAL | Status: DC
  Administered 2015-08-12 – 2015-08-18 (×7): 81 mg via ORAL
  Filled 2015-08-12 (×7): qty 1

## 2015-08-12 MED ORDER — FENTANYL CITRATE (PF) 100 MCG/2ML IJ SOLN
50.0000 ug | INTRAMUSCULAR | Status: DC | PRN
  Administered 2015-08-12 – 2015-08-14 (×3): 50 ug via INTRAVENOUS
  Filled 2015-08-12 (×3): qty 2

## 2015-08-12 MED ORDER — VANCOMYCIN HCL 10 G IV SOLR
1250.0000 mg | INTRAVENOUS | Status: DC
  Administered 2015-08-13 – 2015-08-15 (×3): 1250 mg via INTRAVENOUS
  Filled 2015-08-12 (×3): qty 1250

## 2015-08-12 MED ORDER — MIRTAZAPINE 15 MG PO TABS
15.0000 mg | ORAL_TABLET | Freq: Every day | ORAL | Status: DC
  Administered 2015-08-12 – 2015-08-17 (×6): 15 mg via ORAL
  Filled 2015-08-12 (×6): qty 1

## 2015-08-12 NOTE — ED Notes (Signed)
Respiratory at bedside.

## 2015-08-12 NOTE — Progress Notes (Signed)
Initial Nutrition Assessment  DOCUMENTATION CODES:  Non-severe (moderate) malnutrition in context of chronic illness   Pt meets criteria for MODERATE MALNUTRITION in the context of Chronic illness as evidenced by Moderate muscle/fat wasting.  INTERVENTION:  If unable to extubate <48 hrs and medically able, recommend TF. Vtial AF 1.2 @ 20 ml/hr via OG/NGT and increase by 10 ml every 4 hours to goal rate of 65 ml/hr.    Tube feeding regimen provides 1872 kcal (99% of needs), 117 grams of protein, and 1265 ml of H2O.   NUTRITION DIAGNOSIS:  Inadequate oral intake related to inability to eat as evidenced by NPO status.  GOAL:  Patient will meet greater than or equal to 90% of their needs  MONITOR:  Vent status, Labs, I & O's, Diet advancement  REASON FOR ASSESSMENT:  Ventilator    ASSESSMENT:  70 y/o male PMHx CHF, HTN, HLD, CKD, COPD, A fib who presents with persistent CP. Pt was just discharged from [facility] for which he was admitted due to A fib + HCAP. Pt intubated in ED for acute encephalopathy. Septic vs hypercapnic resp failure  Pt intubated. Did not respond to questioning  Patient is currently intubated on ventilator support MV: 10.1 L/min Temp (24hrs), Avg:98.1 F (36.7 C), Min:96.7 F (35.9 C), Max:99.8 F (37.7 C)  Propofol: None  NFPE: Mild-moderate muscle/fat wasting. Mild-moderate BLE edema   Labs reviewed:Hypomagnesemia, Elevated BNP, Hyperkalemic/natremic/chloremic, hypoalbuminemia  Pt was just discharged at weight of 157 lbs. Will use as dosing weight given fluid overload.   Diet Order:  Diet NPO time specified  Skin: PU stage 2 to buttocks  Last BM:  Unknown  Height:  Ht Readings from Last 1 Encounters:  08/12/15 6\' 3"  (1.905 m)   Weight:  Wt Readings from Last 1 Encounters:  08/12/15 174 lb 2.6 oz (79 kg)   Wt Readings from Last 10 Encounters:  08/12/15 174 lb 2.6 oz (79 kg)  08/08/15 157 lb 9.6 oz (71.487 kg)  07/19/15 165 lb 4.8 oz (74.98  kg)  06/27/15 148 lb 11.2 oz (67.45 kg)  06/20/15 146 lb (66.225 kg)  06/16/15 146 lb (66.225 kg)  05/03/15 145 lb 8 oz (65.998 kg)  03/02/15 154 lb 9.6 oz (70.126 kg)  12/12/14 152 lb (68.947 kg)  11/23/14 151 lb 8 oz (68.72 kg)   Ideal Body Weight:  89.1 kg  BMI:  Body mass index is 19.7 kg/(m^2).  Estimated Nutritional Needs:  Kcal:  1895 Protein:  [id number] (1.5-1.7 g/kg bw) Fluid:  Per MD  EDUCATION NEEDS:  No education needs identified at this time  Burtis Junes RD, LDN Clinical Nutrition Pager: [phone] 08/12/2015 3:26 PM

## 2015-08-12 NOTE — Procedures (Signed)
CENTRAL VENOUS CATHETER INSERTION   Indication: Vascular acces Consent: emergent Time out: yes Appropriate position: yes Hand washing: yes Patient Sterilized and Draped: yes Location: Right IJ # of Attempts: 1 Ultrasound Guidance: yes Wire Confirmed with [place]: yes Insertion depth: 20 cm All Ports Draw and flush: yes CXR:   Pneumothorax: no  Line position appropriate: yes Line sutured in place: yes EBL: 5 cc Complications: no Patient Tolerated Procedure Well: yes     Travis Mattes, DO., MS [facility]

## 2015-08-12 NOTE — Progress Notes (Signed)
[place] for heparin Indication: atrial fibrillation  Allergies  Allergen Reactions  . 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 Penicillins Rash    Patient Measurements: Height: 6\' 3"  (190.5 cm) Weight: 174 lb 2.6 oz (79 kg) IBW/kg (Calculated) : 84.5  Vital Signs: Temp: 98.7 F (37.1 C) (03/25 1600) Temp Source: Axillary (03/25 1600) BP: 87/59 mmHg (03/25 1830) Pulse Rate: 96 (03/25 1830)  Labs:  Recent Labs  08/12/15 0427 08/12/15 0847 08/12/15 0849 08/12/15 1330 08/12/15 1813  HGB 13.8  --   --   --   --   HCT 44.3  --   --   --   --   PLT 177  --   --   --   --   APTT  --   --  36  --  67*  HEPARINUNFRC  --   --  1.08*  --   --   CREATININE 1.86*  --   --   --   --   TROPONINI  --  0.09*  --  0.08* 0.07*    Estimated Creatinine Clearance: 40.7 mL/min (by C-G formula based on Cr of 1.86).   Assessment: 70yo male c/o worsening SOB and CP after recent admission for PNA, completed ABX course, now intubated now on heparin (he was on apixiban with last dose on 3/21 at 1630) and the baseline heparin level was elevated at 1.08 (will need to use aPTTs) -APTT= 67 and at goal  Goal of Therapy:  Heparin level 0.3-0.7 units/ml aPTT 66-102 seconds Monitor platelets by anticoagulation protocol: Yes   Plan:  -No heparin changes needed -Daily heparin level, aPTT and CBC  Hildred Laser, Pharm D 08/12/2015 7:04 PM

## 2015-08-12 NOTE — ED Notes (Signed)
CBG was 104

## 2015-08-12 NOTE — ED Notes (Signed)
PER [facility]: Patient to ED from [facility] for possible sepsis, A-Fib with RVR. Patient was admitted 3/10-3/22 for pneumonia and A-Fib RVR - finished all abx while there. At [facility] for rehab following the pna and CHF. Patient on 2L O2 [place] all the time but per SNF, has required 4L O2 to remain >92%. Pt c/o CP with breathing, SOB, and fatigue. Patient normally in A-Fib with fast rate - was taken off amiodarone while in hospital and put on something else. Patient also c/o being "cold." EMS temp 95, HR 132, 116/90, CBG 125. Patient A&O per baseline.

## 2015-08-12 NOTE — Progress Notes (Signed)
Pharmacy Code Sepsis Protocol  Time of code sepsis page: 3/25 0442 [x]  Antibiotics already pulled from pyxis by ED RN - first given at 0444   Were antibiotics ordered at the time of the code sepsis page? Yes Was it required to contact the physician? [x]  Physician not contacted []  Physician contacted to order antibiotics for code sepsis []  Physician contacted to recommend changing antibiotics  Anti-infectives    Start     Dose/Rate Route Frequency Ordered Stop   08/12/15 0445  ceFEPIme (MAXIPIME) 2 g in dextrose 5 % 50 mL IVPB     2 g 100 mL/hr over 30 Minutes Intravenous  Once 08/12/15 0435 08/12/15 0514   08/12/15 0445  vancomycin (VANCOCIN) 1,250 mg in sodium chloride 0.9 % 250 mL IVPB     1,250 mg 166.7 mL/hr over 90 Minutes Intravenous  Once 08/12/15 0435          Nurse education provided - no education needed   Sherlon Handing, PharmD, BCPS Clinical pharmacist, pager [phone] 08/12/2015, 6:23 AM

## 2015-08-12 NOTE — ED Notes (Signed)
Vancomycin to be delivered from pharmacy.

## 2015-08-12 NOTE — ED Notes (Signed)
Dr. Claudine Mouton, this RN, Darci Current RN, respiratory, Prom NT at bedside

## 2015-08-12 NOTE — ED Notes (Signed)
Holding metoprolol at this time per MD - BP 92/71.

## 2015-08-12 NOTE — ED Notes (Signed)
Levo stopped per Dr. Diona Fanti

## 2015-08-12 NOTE — ED Notes (Signed)
X-ray at bedside

## 2015-08-12 NOTE — ED Provider Notes (Signed)
CSN: [id number]     Arrival date & time 08/12/15  0413 History   First MD Initiated Contact with Patient 08/12/15 [phone]     Chief Complaint  Patient presents with  . Pneumonia     (Consider location/radiation/quality/duration/timing/severity/associated sxs/prior Treatment) HPI  [name] is a 70yo male, here for worsening SOB and CP.  Patient was just admitted here for pneumonia and completed a round of antibiotics. EMS states he was cold and clammy upon arrival. He was 93% on his room 2L O2.  He was in a fib with RVR during transport. Patient states he is still currently having chest pain.  He denies fevers or chills.  Patient has no further complaints.  10 Systems reviewed and are negative for acute change except as noted in the HPI.    Past Medical History  Diagnosis Date  . Hypertension   . Chronic systolic heart failure ([place])     a. NICM - EF 35% with normal cors 2003. b. Last echo 11/2012 - EF 25-30%.  . Depression   . GERD (gastroesophageal reflux disease)   . Portal vein thrombosis   . Avascular necrosis of bones of both hips ([facility])     Status post bilateral hip replacements  . Gastritis   . Alcohol abuse   . Erectile dysfunction   . Bipolar disorder ([place])   . Hydrocele, unspecified   . Lung nodule     Chest CT scan on 12/14/2008 showed a nodular opacity at the left lung base felt to most likely represent scarring.  Follow-up chest CT scan on 06/02/2010 showed parenchymal scarring in the left apex, left  lower lobe, and lingula; some of this scarring at the left base had a nodular appearance, unchanged. Chest 09/2012 - stable.  . 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  Thyroid scan 08/28/2009 was normal with no focal areas of abnormal increased or decreased activity seen; the uptake of I 131 sodium iodide at 24 hours was 5.7%.  TSH and free T4 normalized by 08/16/2009.  . Intestinal obstruction ([place])   . COPD (chronic obstructive  pulmonary disease) ([place])   . E coli bacteremia   . DVT (deep venous thrombosis) ([place])     He has hypercoagulability with multiple prior DVTs  . Pleural plaque     H/o asbestos exposure. Chest CT on 06/02/2010 showed stable extensive calcified pleural plaques involving the left hemithorax, consistent with asbestos related pleural disease.  . Weight loss     Normal colonoscopy by Dr. Olevia Perches on 11/06/2010.  . 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 b. St. Jude gen change 2010.  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  . Noncompliance   . Thrombocytopenia ([place])   . Osteoarthritis   . Spondylosis   . Anxiety   . CKD (chronic kidney disease)     Renal U/S 12/04/2009 showed no pathological findings. Labs 12/04/2009 include normal ESR, C3, C4; neg ANA; SPEP showed nonspecific increase in the alpha-2 region with no M-spike; UPEP showed no monoclonal free light chains; urine IFE showed polyclonal increase in feree Kappa and/or free Lambda light chains. Baseline Cr reported 1.7-2.5.  . STEMI (ST elevation myocardial infarction) ([place]) 10/11/2014  . Hyperlipemia    Past Surgical History  Procedure Laterality Date  . Total hip arthroplasty Right 03/08/2008  By Dr. Hiram Comber  . Tee without cardioversion N/A 05/27/2013    Procedure: TRANSESOPHAGEAL ECHOCARDIOGRAM (TEE);  Surgeon: Candee Furbish, MD;  Location: [facility] ENDOSCOPY;  Service: Cardiovascular;  Laterality: N/A;  . Cardioversion N/A 05/27/2013    Procedure: CARDIOVERSION;  Surgeon: Candee Furbish, MD;  Location: [facility] ENDOSCOPY;  Service: Cardiovascular;  Laterality: N/A;  . Cardiac catheterization  2003    Nl cors, EF 35%  . Embolectomy Right 12/10/2013    Procedure: Right Femoral Embolectomy; Fasciotomy Right Lower Leg with Intraoperative arteriogram;  Surgeon: Rosetta Posner, MD;  Location: [place];  Service:  Vascular;  Laterality: Right;  . Intraoperative arteriogram Right 12/10/2013    Procedure: INTRA OPERATIVE ARTERIOGRAM;  Surgeon: Rosetta Posner, MD;  Location: [place];  Service: Vascular;  Laterality: Right;  . Fasciotomy closure Right 12/15/2013    Procedure: LEG FASCIOTOMY CLOSURE;  Surgeon: Rosetta Posner, MD;  Location: [facility] OR;  Service: Vascular;  Laterality: Right;  . Embolectomy Left 10/12/2014    Procedure: Left Femoral Thrombectomy with intraoperative arteriogram;  Surgeon: Rosetta Posner, MD;  Location: [facility] OR;  Service: Vascular;  Laterality: Left;  . Hemiarthroplasty hip Left 09/09/2002    bipolar; Dr. Lafayette Dragon  . Joint replacement    . Insert / replace / remove pacemaker  02/2003    Dr. Roe Rutherford, [place] pulse generator identity SR model [id number] serial [id number]; gen change by Greggory Brandy  . Insert / replace / remove pacemaker  06/17/2008    Lead and generator change w/ St. Jude Medical Tendril ST model [id number] (serial  [id number]).        Family History  Problem Relation Age of Onset  . Hypertension Mother   . Cancer Brother     Unsure of type.  . Asthma Mother   . Coronary artery disease Mother   . Colon cancer Neg Hx   . Lung cancer Neg Hx   . Prostate cancer Neg Hx   . Stroke      mother  . Heart attack      brother and sister ?age    Social History  Substance Use Topics  . Smoking status: Former Smoker -- 0.33 packs/day for 54 years    Types: Cigarettes    Quit date: 10/11/2014  . Smokeless tobacco: Never Used  . Alcohol Use: No     Comment: 6/22//2016 "h/o etoh abuse; stopped drinking ~ 2012"    Review of Systems    Allergies  Amiodarone and Penicillins  Home Medications   Prior to Admission medications   Medication Sig Start Date End Date Taking? Authorizing Provider  albuterol (VENTOLIN HFA) 108 (90 BASE) MCG/ACT inhaler Inhale 2 puffs into the lungs every 6 (six) hours as needed for wheezing or shortness of breath. 01/06/15   Nischal Narendra, MD   apixaban (ELIQUIS) 2.5 MG TABS tablet Take 1 tablet (2.5 mg total) by mouth 2 (two) times daily. 08/08/15   Zada Finders, MD  aspirin 81 MG chewable tablet Chew 1 tablet (81 mg total) by mouth daily. 11/17/14   Rushil Sherrye Payor, MD  bisacodyl (DULCOLAX) 10 MG suppository Place 1 suppository (10 mg total) rectally daily as needed for severe constipation. 01/06/15   Nischal Narendra, MD  budesonide (PULMICORT) 0.25 MG/2ML nebulizer solution Take 2 mLs (0.25 mg total) by nebulization 2 (two) times daily. 08/08/15   Zada Finders, MD  buPROPion (WELLBUTRIN) 75 MG tablet Take 1 tablet (75 mg total) by mouth 2 (two) times daily. 01/06/15  Aldine Contes, MD  gabapentin (NEURONTIN) 300 MG capsule TAKE ONE CAPSULE BY MOUTH 3 TIMES A DAY 06/19/15   Zada Finders, MD  metoprolol (TOPROL-XL) 200 MG 24 hr tablet Take 2 tablets (200 mg total) in the morning.  Take 1 tablet (100 mg total) in the evening. Take with or immediately following a meal. 08/09/15   Thompson Grayer, MD  mirtazapine (REMERON) 15 MG tablet TAKE 1 TABLET BY MOUTH AT BEDTIME 07/25/15   Zada Finders, MD  Naloxone HCl (EVZIO) 0.4 MG/0.4ML SOAJ Inject 0.4 mLs as directed as needed (opioid overdose). [name] 06/28/15   Lucious Groves, DO  omeprazole (PRILOSEC) 40 MG capsule Take 1 capsule (40 mg total) by mouth daily. 01/06/15   Aldine Contes, MD  oxyCODONE-acetaminophen (PERCOCET) 10-325 MG tablet Take 1 tablet by mouth every 4 (four) hours as needed for pain. Do not take more than 5 doses in a 24 hour period. 08/08/15   Juluis Mire, MD  predniSONE (DELTASONE) 10 MG tablet Take 1 tablet (10 mg total) by mouth daily with breakfast. 08/09/15   Zada Finders, MD  rosuvastatin (CRESTOR) 20 MG tablet Take 1 tablet (20 mg total) by mouth daily at 6 PM. 01/06/15   Aldine Contes, MD  senna-docusate ([employer] SENNA PLUS) 8.6-50 MG tablet Take 1 tablet by mouth daily as needed for moderate constipation. 08/08/15   Zada Finders, MD  tiotropium (SPIRIVA) 18 MCG  inhalation capsule Place 1 capsule (18 mcg total) into inhaler and inhale daily. 01/06/15   Nischal Narendra, MD  Urea 37.5 % CREA Apply 1 application topically daily as needed (for irritation). 08/08/15   Zada Finders, MD   BP 105/90 mmHg  Temp(Src) 99.8 F (37.7 C) (Rectal)  Resp 12  Ht 6' 3" (1.905 m)  Wt 157 lb (71.215 kg)  BMI 19.62 kg/m2  SpO2 100% Physical Exam  Constitutional: He is oriented to person, place, and time. Vital signs are normal. He appears well-developed and well-nourished.  Non-toxic appearance. He does not appear ill. He appears distressed.  HENT:  Head: Normocephalic and atraumatic.  Nose: Nose normal.  Mouth/Throat: Oropharynx is clear and moist. No oropharyngeal exudate.  Nasal cannula in place  Eyes: Conjunctivae and EOM are normal. Pupils are equal, round, and reactive to light. No scleral icterus.  Neck: Normal range of motion. Neck supple. No tracheal deviation, no edema, no erythema and normal range of motion present. No thyroid mass and no thyromegaly present.  Cardiovascular: S1 normal, S2 normal, normal heart sounds, intact distal pulses and normal pulses.  Exam reveals no gallop and no friction rub.   No murmur heard. irreg irreg rhythm with tachycardia  Pulmonary/Chest: Effort normal. No respiratory distress. He has wheezes. He has no rhonchi. He has rales.  Abdominal: Soft. Normal appearance and bowel sounds are normal. He exhibits no distension, no ascites and no mass. There is no hepatosplenomegaly. There is no tenderness. There is no rebound, no guarding and no CVA tenderness.  Musculoskeletal: Normal range of motion. He exhibits no edema or tenderness.  Lymphadenopathy:    He has no cervical adenopathy.  Neurological: He is alert and oriented to person, place, and time. He has normal strength. No cranial nerve deficit or sensory deficit.  Skin: Skin is warm, dry and intact. No petechiae and no rash noted. He is not diaphoretic. No erythema. No  pallor.  Psychiatric: He has a normal mood and affect. His behavior is normal. Judgment normal.  Nursing note and vitals reviewed.  ED Course  Procedures (including critical care time) Labs Review Labs Reviewed  CBC WITH DIFFERENTIAL/PLATELET - Abnormal; Notable for the following:    WBC 15.3 (*)    RDW 15.9 (*)    Neutro Abs 11.4 (*)    Monocytes Absolute 1.2 (*)    All other components within normal limits  COMPREHENSIVE METABOLIC PANEL - Abnormal; Notable for the following:    Sodium 147 (*)    Potassium 5.8 (*)    Chloride 116 (*)    Glucose, Bld 107 (*)    BUN 54 (*)    Creatinine, Ser 1.86 (*)    Calcium 8.4 (*)    Total Protein 5.5 (*)    Albumin 2.8 (*)    ALT 91 (*)    GFR calc non Af Amer 35 (*)    GFR calc Af Amer 40 (*)    All other components within normal limits  I-STAT TROPOININ, ED - Abnormal; Notable for the following:    Troponin i, poc 0.11 (*)    All other components within normal limits  CBG MONITORING, ED - Abnormal; Notable for the following:    Glucose-Capillary 104 (*)    All other components within normal limits  URINE CULTURE  CULTURE, BLOOD (ROUTINE X 2)  CULTURE, BLOOD (ROUTINE X 2)  LIPASE, BLOOD  URINALYSIS, ROUTINE W REFLEX MICROSCOPIC (NOT AT [facility])  BLOOD GAS, ARTERIAL  I-STAT CG4 LACTIC ACID, ED  I-STAT CG4 LACTIC ACID, ED    Imaging Review Dg Chest Port 1 View  08/12/2015  CLINICAL DATA:  Shortness of breath, weakness, recent pneumonia. History of hypertension, COPD, lung nodule. EXAM: PORTABLE CHEST 1 VIEW COMPARISON:  Chest radiograph August 04, 2015 and June 16, 2015 FINDINGS: Similar to worsening LEFT midlung zone consolidation, with stable LEFT apical pleural thickening. Moderate suspected LEFT pleural effusion associated with known elevated hemidiaphragm. Strandy densities RIGHT lung base. Cardiac silhouette is upper limits of normal in size, mediastinal silhouette is nonsuspicious, calcified aortic knob. Dual lead RIGHT  cardiac pacemaker in situ. Interval removal of nasogastric tube. Osseous structures are nonsuspicious. IMPRESSION: Similar to worsening LEFT midlung zone consolidation with small LEFT pleural effusion, stable LEFT apical pleural thickening. Followup PA and lateral chest X-ray is recommended in 3-4 weeks following trial of antibiotic therapy to ensure resolution and exclude underlying malignancy. Mild cardiomegaly.  RIGHT lung base atelectasis. Electronically Signed   By: Elon Alas M.D.   On: 08/12/2015 05:04   I have personally reviewed and evaluated these images and lab results as part of my medical decision-making.   EKG Interpretation   Date/Time:  Saturday August 12 2015 04:23:08 EDT Ventricular Rate:  137 PR Interval:    QRS Duration: 82 QT Interval:  288 QTC Calculation: 435 R Axis:   38 Text Interpretation:  Atrial fibrillation Borderline T abnormalities,  lateral leads No significant change since last tracing Confirmed by Glynn Octave [phone]) on 08/12/2015 4:27:33 AM      MDM   Final diagnoses:  None    Patient presents to the ED for worsening SOB and CP.  CXR reveals worsening pneumonia.  Patient treated for HCAP and will be admitted for further care.  Code sepsis was called. Will page int med teaching service for admission.  5:47 AM I was called to the room for acute change in mental status and hypoxia.  Patient now on NRB and appears to be hyperbaric with head nodding and drowsiness. He does easily awaken. Decision was made  to intubate the patient for respiratory failure.  This may be due to aggressive fluid resuscitation of a CHF patient, or it could simply be his worsening pneumonia.  Will page ICU for admission.   INTUBATION Performed by: Everlene Balls  Required items: required blood products, implants, devices, and special equipment available Patient identity confirmed: provided demographic data and hospital-assigned identification number Time out:  Immediately prior to procedure a "time out" was called to verify the correct patient, procedure, equipment, support staff and site/side marked as required.  Indications: respiratory failure  Intubation method: Direct Laryngoscopy   Preoxygenation: 100% BVM  Sedatives: 58m Etomidate Paralytic: 1028mRocuronium  Tube Size: 8-0 cuffed  Post-procedure assessment: chest rise and ETCO2 monitor Breath sounds: equal and absent over the epigastrium Tube secured with: ETT holder Chest x-ray interpreted by radiologist and me.  Chest x-ray findings: endotracheal tube in appropriate position  Patient tolerated the procedure well with no immediate complications.    CRITICAL CARE Performed by: ONEverlene Balls Total critical care time: 50 minutes - respiratory failure and sepsis  Critical care time was exclusive of separately billable procedures and treating other patients.  Critical care was necessary to treat or prevent imminent or life-threatening deterioration.  Critical care was time spent personally by me on the following activities: development of treatment plan with patient and/or surrogate as well as nursing, discussions with consultants, evaluation of patient's response to treatment, examination of patient, obtaining history from patient or surrogate, ordering and performing treatments and interventions, ordering and review of laboratory studies, ordering and review of radiographic studies, pulse oximetry and re-evaluation of patient's condition.     AdEverlene BallsMD 08/12/15 [phone]

## 2015-08-12 NOTE — Progress Notes (Signed)
Pharmacy Antibiotic Note  [name] is a 70 y.o. male admitted on 08/12/2015 with HCAP and atrial fibrillation.  Pharmacy has been consulted for Vancomycin and Cefepime dosing. He has CKD stage 3 - his SCr appears close to his baseline. He received doses of Vanc and Cefepime in the ED at ~0500.  Plan: Vancomycin 1250mg  IV q24h Cefepime 2g IV q24h Monitor renal function - serum creatinine and urine output Follow any available micro data  Height: 6\' 3"  (190.5 cm) Weight: 174 lb 2.6 oz (79 kg) IBW/kg (Calculated) : 84.5  Temp (24hrs), Avg:98.1 F (36.7 C), Min:96.7 F (35.9 C), Max:99.8 F (37.7 C)   Recent Labs Lab 08/06/15 1835 08/07/15 0504 08/07/15 0841 08/08/15 0337 08/08/15 0628 08/12/15 0427 08/12/15 0447 08/12/15 0735  WBC  --  15.8*  --   --   --  15.3*  --   --   CREATININE 2.19*  --  2.39* 2.41* 2.49* 1.86*  --   --   LATICACIDVEN  --   --   --   --   --   --  0.91 2.02*    Estimated Creatinine Clearance: 40.7 mL/min (by C-G formula based on Cr of 1.86).    Allergies  Allergen Reactions  . 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 Penicillins Rash    Antimicrobials this admission: Vancomycin 3/25>> Cefepime 3/25>>  Dose adjustments this admission:   Microbiology results: 3/25 BCx:  3/25 UCx:    Thank you for allowing pharmacy to be a part of this patient's care.  Norva Riffle 08/12/2015 2:53 PM

## 2015-08-12 NOTE — H&P (Signed)
PULMONARY / CRITICAL CARE MEDICINE   Name: [name] MRN: [id number] DOB: 24-May-1945    ADMISSION DATE:  08/12/2015  CHIEF COMPLAINT:  Chest pain   HISTORY OF PRESENT ILLNESS:   Patient is intubated and sedated with no family members to assist with history. 70 yo male with MMP including HFrEF (EF30%), Afib, COPD, HTN, CKD who presents from [facility] Chest pain which was persistent from time of recent D/C from [facility] hospital on 3/22 when he was admitted for Afib with RVR and HCAP. He completed a course of ABX and prednisone for COPD exacerbation.  PAST MEDICAL HISTORY :   has a past medical history of Hypertension; Chronic systolic heart failure ([place]); Depression; GERD (gastroesophageal reflux disease); Portal vein thrombosis; Avascular necrosis of bones of both hips ([place]); Gastritis; Alcohol abuse; Erectile dysfunction; Bipolar disorder ([place]); Hydrocele, unspecified; Lung nodule; Hyperthyroidism; Intestinal obstruction (HCC); COPD (chronic obstructive pulmonary disease) ([place]); E coli bacteremia; DVT (deep venous thrombosis) ([place]); Pleural plaque; Weight loss; Tachycardia-bradycardia syndrome ([place]); Thrombosis; Noncompliance; Thrombocytopenia ([place]); Osteoarthritis; Spondylosis; Anxiety; CKD (chronic kidney disease); STEMI (ST elevation myocardial infarction) ([place]) (10/11/2014); and Hyperlipemia.  has past surgical history that includes Total hip arthroplasty (Right, 03/08/2008); TEE without cardioversion (N/A, 05/27/2013); Cardioversion (N/A, 05/27/2013); Cardiac catheterization (2003); Embolectomy (Right, 12/10/2013); Intraoperative arteriogram (Right, 12/10/2013); Fasciotomy closure (Right, 12/15/2013); Embolectomy (Left, 10/12/2014); Hemiarthroplasty hip (Left, 09/09/2002); Joint replacement; Insert / replace / remove pacemaker (02/2003); and Insert / replace / remove pacemaker (06/17/2008). Prior to Admission medications   Medication Sig Start Date End Date Taking? Authorizing Provider  albuterol  (VENTOLIN HFA) 108 (90 BASE) MCG/ACT inhaler Inhale 2 puffs into the lungs every 6 (six) hours as needed for wheezing or shortness of breath. 01/06/15  Yes Nischal Dareen Piano, MD  apixaban (ELIQUIS) 2.5 MG TABS tablet Take 1 tablet (2.5 mg total) by mouth 2 (two) times daily. 08/08/15  Yes Zada Finders, MD  aspirin 81 MG chewable tablet Chew 1 tablet (81 mg total) by mouth daily. 11/17/14  Yes Rushil Sherrye Payor, MD  bisacodyl (DULCOLAX) 10 MG suppository Place 1 suppository (10 mg total) rectally daily as needed for severe constipation. 01/06/15  Yes Nischal Narendra, MD  budesonide (PULMICORT) 0.25 MG/2ML nebulizer solution Take 2 mLs (0.25 mg total) by nebulization 2 (two) times daily. 08/08/15  Yes Zada Finders, MD  buPROPion (WELLBUTRIN) 75 MG tablet Take 1 tablet (75 mg total) by mouth 2 (two) times daily. 01/06/15  Yes Aldine Contes, MD  cholecalciferol (VITAMIN D) 1000 units tablet Take 1,000 Units by mouth daily.   Yes Historical Provider, MD  gabapentin (NEURONTIN) 300 MG capsule TAKE ONE CAPSULE BY MOUTH 3 TIMES A DAY 06/19/15  Yes Zada Finders, MD  metoprolol succinate (TOPROL-XL) 100 MG 24 hr tablet Take 100 mg by mouth daily. Take with or immediately following a meal.   Yes Historical Provider, MD  mirtazapine (REMERON) 15 MG tablet TAKE 1 TABLET BY MOUTH AT BEDTIME 07/25/15  Yes Zada Finders, MD  Naloxone HCl (EVZIO) 0.4 MG/0.4ML SOAJ Inject 0.4 mLs as directed as needed (opioid overdose). [name] 06/28/15  Yes Lucious Groves, DO  omeprazole (PRILOSEC) 40 MG capsule Take 1 capsule (40 mg total) by mouth daily. 01/06/15  Yes Nischal Dareen Piano, MD  oxyCODONE-acetaminophen (PERCOCET) 10-325 MG tablet Take 1 tablet by mouth every 4 (four) hours as needed for pain. Do not take more than 5 doses in a 24 hour period. 08/08/15  Yes Marjan Rabbani, MD  rosuvastatin (CRESTOR) 20 MG tablet Take 1  tablet (20 mg total) by mouth daily at 6 PM. 01/06/15  Yes Nischal Narendra, MD  senna-docusate ([employer] SENNA PLUS)  8.6-50 MG tablet Take 1 tablet by mouth daily as needed for moderate constipation. 08/08/15  Yes Zada Finders, MD  sodium polystyrene (KAYEXALATE) 15 GM/60ML suspension Take 15 g by mouth once.   Yes Historical Provider, MD  tiotropium (SPIRIVA) 18 MCG inhalation capsule Place 1 capsule (18 mcg total) into inhaler and inhale daily. 01/06/15  Yes Nischal Narendra, MD  UNABLE TO FIND Take 120 mLs by mouth 2 (two) times daily between meals. Med Name: MedPass   Yes Historical Provider, MD  Urea 37.5 % CREA Apply 1 application topically daily as needed (for irritation). 08/08/15  Yes Zada Finders, MD  metoprolol (TOPROL-XL) 200 MG 24 hr tablet Take 2 tablets (200 mg total) in the morning.  Take 1 tablet (100 mg total) in the evening. Take with or immediately following a meal. 08/09/15   Thompson Grayer, MD  predniSONE (DELTASONE) 10 MG tablet Take 1 tablet (10 mg total) by mouth daily with breakfast. Patient not taking: Reported on 08/12/2015 08/09/15   Zada Finders, MD   Allergies  Allergen Reactions  . 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 Penicillins Rash    FAMILY HISTORY:  indicated that his mother is deceased. He indicated that his father is deceased. He indicated that his brother is alive.  SOCIAL HISTORY:  reports that he quit smoking about 10 months ago. His smoking use included Cigarettes. He has a 17.82 pack-year smoking history. He has never used smokeless tobacco. He reports that he does not drink alcohol or use illicit drugs.  REVIEW OF SYSTEMS:  Unable to obtain  SUBJECTIVE:   VITAL SIGNS: Temp:  [99.8 F (37.7 C)] 99.8 F (37.7 C) (03/25 0450) Pulse Rate:  [55-82] 82 (03/25 0533) Resp:  [12-26] 16 (03/25 0538) BP: (86-118)/(62-90) 107/62 mmHg (03/25 0538) SpO2:  [82 %-100 %] 85 % (03/25 0600) FiO2 (%):  [100 %] 100 % (03/25 0600) Weight:  [71.215 kg (157 lb)] 71.215 kg (157 lb) (03/25 0523) HEMODYNAMICS:   VENTILATOR SETTINGS: Vent Mode:  [-]  PRVC FiO2 (%):  [100 %] 100 % Set Rate:  [15 bmp] 15 bmp Vt Set:  [id number] mL] 670 mL PEEP:  [5 cmH20] 5 cmH20 Plateau Pressure:  [15 cmH20] 15 cmH20 INTAKE / OUTPUT: No intake or output data in the 24 hours ending 08/12/15 0615  PHYSICAL EXAMINATION: General:  Sedated, intubated, NAD, diaphoretic Neuro:  Sedated HEENT:  ETT in place Cardiovascular:  Irregulare rate and rhythm, no m/r/g Lungs:  Diffuse + wheezing B/l, no rhonchi or rales Abdomen:  Soft, NT, ND, + bowel sounds Musculoskeletal:  Normal bulk and tone Skin:  Trace b/l pitting edema of LE  LABS:  CBC  Recent Labs Lab 08/07/15 0504 08/12/15 0427  WBC 15.8* 15.3*  HGB 14.0 13.8  HCT 45.2 44.3  PLT 235 177   Coag's No results for input(s): APTT, INR in the last 168 hours. BMET  Recent Labs Lab 08/08/15 0337 08/08/15 0628 08/12/15 0427  NA 146* 145 147*  K 6.5* 5.9* 5.8*  CL 114* 113* 116*  CO2 21* 20* 23  BUN 83* 82* 54*  CREATININE 2.41* 2.49* 1.86*  GLUCOSE 108* 111* 107*   Electrolytes  Recent Labs Lab 08/08/15 0337 08/08/15 0628 08/12/15 0427  CALCIUM 8.9 8.9 8.4*   Sepsis Markers  Recent Labs Lab 08/12/15 0447  LATICACIDVEN 0.91   ABG No results for input(s): PHART, PCO2ART, PO2ART in the last 168 hours. Liver Enzymes  Recent Labs Lab 08/08/15 0628 08/12/15 0427  AST 62* 41  ALT 127* 91*  ALKPHOS 62 70  BILITOT 0.6 0.5  ALBUMIN 3.0* 2.8*   Cardiac Enzymes No results for input(s): TROPONINI, PROBNP in the last 168 hours. Glucose  Recent Labs Lab 08/12/15 0511  GLUCAP 104*    Imaging Dg Chest Port 1 View  08/12/2015  CLINICAL DATA:  Shortness of breath, weakness, recent pneumonia. History of hypertension, COPD, lung nodule. EXAM: PORTABLE CHEST 1 VIEW COMPARISON:  Chest radiograph August 04, 2015 and June 16, 2015 FINDINGS: Similar to worsening LEFT midlung zone consolidation, with stable LEFT apical pleural thickening. Moderate suspected LEFT pleural effusion  associated with known elevated hemidiaphragm. Strandy densities RIGHT lung base. Cardiac silhouette is upper limits of normal in size, mediastinal silhouette is nonsuspicious, calcified aortic knob. Dual lead RIGHT cardiac pacemaker in situ. Interval removal of nasogastric tube. Osseous structures are nonsuspicious. IMPRESSION: Similar to worsening LEFT midlung zone consolidation with small LEFT pleural effusion, stable LEFT apical pleural thickening. Followup PA and lateral chest X-ray is recommended in 3-4 weeks following trial of antibiotic therapy to ensure resolution and exclude underlying malignancy. Mild cardiomegaly.  RIGHT lung base atelectasis. Electronically Signed   By: Elon Alas M.D.   On: 08/12/2015 05:04     ASSESSMENT / PLAN: 70 yo male with MMP here with CP, mildly elevated troponin and possible HCAP vs iatrogenic pulmonary edema currently in Afib with RVR hemodynamically stable intubated for acute encephalopathy likely septic vs hypercapnic respiratory failure  PULMONARY A: HCAP COPD with acute exacerbation Cardiogenic pulmonary edema Iatrogenic respiratory acidosis (acute) P:   - intubated - ETT in appropriate position - methylpred 40mg  x1 - Pred 40mg  PO Qday x5 days - oral care - PPI  - Pulmicort - levalbuterol and ipratropium neb Q6hours - currently ono ARDS - decrease FiO2 to 50% - ABG- acceptable - sputum cx - urine legionella - urine strep - RVP   CARDIOVASCULAR A:  Elevated troponin - cannot r/o ACS though less likely Afib with RVR Peri-intubation hypotension P:  - check dig level - restart metoprolol Tartrate 25mg  PO BID uptitrate - heparin gtt -trend troponin - EKG without acute findings - BNP - dig load depending on level  RENAL A:   CKD - Scr at baseline Uremia HyperNa- likely hypovolemic HyperK- non life threatening Lactic acidemia - mild P:   - Trend BMP - No HD indication now - s/p fluid resucitation - trend  lactate  GASTROINTESTINAL A:   Mild trans amonitis P:   - Trend LFTs - PPI  HEMATOLOGIC A:   Leukocytosis Anticoagulated with eliquis 2.5mg  L P:  - heparin gtt - trend WBC - treat sepsis  INFECTIOUS A:   Sepsis - source likely lung  P:   BCx2 08/12/2015 UC 08/12/2015 Sputum 08/12/2015 Abx: Vancomycin, cefepime, start date3/25/2017  UA ordered Pulmonary infectious w/u as above  ENDOCRINE A:    Not active P:   - BG <180  NEUROLOGIC A:   Sedated Acute encephalopathy  P:   RASS goal: 0 Intermittent fentanly   FC/FT NPO Heparin gtt  Total critical care time: 60 min  Critical care time was exclusive of separately billable procedures and treating other patients.  Critical care was necessary to treat or prevent imminent or life-threatening deterioration.  Critical care was time spent personally by me on the following  activities: development of treatment plan with patient and/or surrogate as well as nursing, discussions with consultants, evaluation of patient's response to treatment, examination of patient, obtaining history from patient or surrogate, ordering and performing treatments and interventions, ordering and review of laboratory studies, ordering and review of radiographic studies, pulse oximetry and re-evaluation of patient's condition.   Meribeth Mattes, DO., MS [facility]     Pulmonary and [place] Pager: [phone]  08/12/2015, 6:15 AM

## 2015-08-12 NOTE — Progress Notes (Signed)
ANTICOAGULATION CONSULT NOTE - Initial Consult  Pharmacy Consult for heparin Indication: atrial fibrillation  Allergies  Allergen Reactions  . 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 Penicillins Rash    Patient Measurements: Height: 6' 3" (190.5 cm) Weight: 157 lb (71.215 kg) IBW/kg (Calculated) : 84.5  Vital Signs: Temp: 99.8 F (37.7 C) (03/25 0450) Temp Source: Rectal (03/25 0450) BP: 83/66 mmHg (03/25 0607) Pulse Rate: 66 (03/25 0607)  Labs:  Recent Labs  08/12/15 0427  HGB 13.8  HCT 44.3  PLT 177  CREATININE 1.86*    Estimated Creatinine Clearance: 36.7 mL/min (by C-G formula based on Cr of 1.86).   Medical History: Past Medical History  Diagnosis Date  . Hypertension   . Chronic systolic heart failure ([place])     a. NICM - EF 35% with normal cors 2003. b. Last echo 11/2012 - EF 25-30%.  . Depression   . GERD (gastroesophageal reflux disease)   . Portal vein thrombosis   . Avascular necrosis of bones of both hips (HCC)     Status post bilateral hip replacements  . Gastritis   . Alcohol abuse   . Erectile dysfunction   . Bipolar disorder ([place])   . Hydrocele, unspecified   . Lung nodule     Chest CT scan on 12/14/2008 showed a nodular opacity at the left lung base felt to most likely represent scarring.  Follow-up chest CT scan on 06/02/2010 showed parenchymal scarring in the left apex, left  lower lobe, and lingula; some of this scarring at the left base had a nodular appearance, unchanged. Chest 09/2012 - stable.  . 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  Thyroid scan 08/28/2009 was normal with no focal areas of abnormal increased or decreased activity seen; the uptake of I 131 sodium iodide at 24 hours was 5.7%.  TSH and free T4 normalized by 08/16/2009.  . Intestinal obstruction ([place])   . COPD (chronic obstructive pulmonary disease) ([place])   . E coli bacteremia   . DVT (deep venous  thrombosis) ([place])     He has hypercoagulability with multiple prior DVTs  . Pleural plaque     H/o asbestos exposure. Chest CT on 06/02/2010 showed stable extensive calcified pleural plaques involving the left hemithorax, consistent with asbestos related pleural disease.  . Weight loss     Normal colonoscopy by Dr. Olevia Perches on 11/06/2010.  . Tachycardia-bradycardia syndrome [facility])     a. 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 b. [employer] Jude gen change 2010.  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  . Noncompliance   . Thrombocytopenia ([place])   . Osteoarthritis   . Spondylosis   . Anxiety   . CKD (chronic kidney disease)     Renal U/S 12/04/2009 showed no pathological findings. Labs 12/04/2009 include normal ESR, C3, C4; neg ANA; SPEP showed nonspecific increase in the alpha-2 region with no M-spike; UPEP showed no monoclonal free light chains; urine IFE showed polyclonal increase in feree Kappa and/or free Lambda light chains. Baseline Cr reported 1.7-2.5.  . STEMI (ST elevation myocardial infarction) ([place]) 10/11/2014  . Hyperlipemia      Assessment: 70yo male c/o worsening SOB and CP after recent admission for PNA, completed ABX course, now intubated, to transition from Eliquis to heparin; last dose of Eliquis 3/21 at 1630; will need to  monitor heparin using aPTT until heparin levels correlate.  Goal of Therapy:  Heparin level 0.3-0.7 units/ml aPTT 66-102 seconds Monitor platelets by anticoagulation protocol: Yes   Plan:  Will begin heparin gtt at 1000 units/hr and monitor heparin levels, aPTT, and CBC.  Wynona Neat, PharmD, BCPS  08/12/2015,6:58 AM

## 2015-08-13 ENCOUNTER — Inpatient Hospital Stay (HOSPITAL_COMMUNITY): Payer: Commercial Managed Care - HMO

## 2015-08-13 DIAGNOSIS — L8990 Pressure ulcer of unspecified site, unspecified stage: Secondary | ICD-10-CM | POA: Insufficient documentation

## 2015-08-13 DIAGNOSIS — A419 Sepsis, unspecified organism: Principal | ICD-10-CM

## 2015-08-13 DIAGNOSIS — I4891 Unspecified atrial fibrillation: Secondary | ICD-10-CM

## 2015-08-13 DIAGNOSIS — J441 Chronic obstructive pulmonary disease with (acute) exacerbation: Secondary | ICD-10-CM

## 2015-08-13 DIAGNOSIS — J9602 Acute respiratory failure with hypercapnia: Secondary | ICD-10-CM

## 2015-08-13 LAB — CBC
HCT: 39.7 % (ref 39.0–52.0)
HEMOGLOBIN: 12.5 g/dL — AB (ref 13.0–17.0)
MCH: 29.6 pg (ref 26.0–34.0)
MCHC: 31.5 g/dL (ref 30.0–36.0)
MCV: 94.1 fL (ref 78.0–100.0)
PLATELETS: 158 10*3/uL (ref 150–400)
RBC: 4.22 MIL/uL (ref 4.22–5.81)
RDW: 15.5 % (ref 11.5–15.5)
WBC: 14.7 10*3/uL — ABNORMAL HIGH (ref 4.0–10.5)

## 2015-08-13 LAB — BASIC METABOLIC PANEL
ANION GAP: 11 (ref 5–15)
Anion gap: 9 (ref 5–15)
BUN: 59 mg/dL — AB (ref 6–20)
BUN: 62 mg/dL — ABNORMAL HIGH (ref 6–20)
CALCIUM: 9.1 mg/dL (ref 8.9–10.3)
CHLORIDE: 119 mmol/L — AB (ref 101–111)
CO2: 20 mmol/L — ABNORMAL LOW (ref 22–32)
CO2: 20 mmol/L — ABNORMAL LOW (ref 22–32)
CREATININE: 2.06 mg/dL — AB (ref 0.61–1.24)
Calcium: 8.6 mg/dL — ABNORMAL LOW (ref 8.9–10.3)
Chloride: 118 mmol/L — ABNORMAL HIGH (ref 101–111)
Creatinine, Ser: 2.55 mg/dL — ABNORMAL HIGH (ref 0.61–1.24)
GFR calc Af Amer: 36 mL/min — ABNORMAL LOW (ref >60)
GFR calc non Af Amer: 31 mL/min — ABNORMAL LOW (ref >60)
GFR, EST AFRICAN AMERICAN: 28 mL/min — AB (ref >60)
GFR, EST NON AFRICAN AMERICAN: 24 mL/min — AB (ref >60)
GLUCOSE: 103 mg/dL — AB (ref 65–99)
Glucose, Bld: 161 mg/dL — ABNORMAL HIGH (ref 65–99)
POTASSIUM: 5.9 mmol/L — AB (ref 3.5–5.1)
POTASSIUM: 5.7 mmol/L — AB (ref 3.5–5.1)
SODIUM: 149 mmol/L — AB (ref 135–145)
Sodium: 148 mmol/L — ABNORMAL HIGH (ref 135–145)

## 2015-08-13 LAB — URINE CULTURE: Culture: NO GROWTH

## 2015-08-13 LAB — PROCALCITONIN

## 2015-08-13 LAB — LACTIC ACID, PLASMA
LACTIC ACID, VENOUS: 1.7 mmol/L (ref 0.5–2.0)
LACTIC ACID, VENOUS: 2.4 mmol/L — AB (ref 0.5–2.0)

## 2015-08-13 LAB — HEPARIN LEVEL (UNFRACTIONATED): Heparin Unfractionated: 1.1 IU/mL — ABNORMAL HIGH (ref 0.30–0.70)

## 2015-08-13 LAB — MAGNESIUM: Magnesium: 1.6 mg/dL — ABNORMAL LOW (ref 1.7–2.4)

## 2015-08-13 LAB — PHOSPHORUS: Phosphorus: 4.4 mg/dL (ref 2.5–4.6)

## 2015-08-13 LAB — APTT: APTT: 76 s — AB (ref 24–37)

## 2015-08-13 MED ORDER — WHITE PETROLATUM GEL
Status: AC
  Administered 2015-08-13: 0.2
  Filled 2015-08-13: qty 1

## 2015-08-13 MED ORDER — PANTOPRAZOLE SODIUM 40 MG IV SOLR
40.0000 mg | INTRAVENOUS | Status: DC
  Administered 2015-08-13 – 2015-08-14 (×2): 40 mg via INTRAVENOUS
  Filled 2015-08-13 (×2): qty 40

## 2015-08-13 MED ORDER — MAGNESIUM SULFATE 2 GM/50ML IV SOLN
2.0000 g | Freq: Once | INTRAVENOUS | Status: AC
  Administered 2015-08-13: 2 g via INTRAVENOUS
  Filled 2015-08-13: qty 50

## 2015-08-13 MED ORDER — METHYLPREDNISOLONE SODIUM SUCC 40 MG IJ SOLR
40.0000 mg | Freq: Four times a day (QID) | INTRAMUSCULAR | Status: DC
  Administered 2015-08-13 – 2015-08-14 (×4): 40 mg via INTRAVENOUS
  Filled 2015-08-13 (×5): qty 1

## 2015-08-13 MED ORDER — SODIUM POLYSTYRENE SULFONATE 15 GM/60ML PO SUSP
30.0000 g | Freq: Once | ORAL | Status: AC
  Administered 2015-08-13: 30 g
  Filled 2015-08-13: qty 120

## 2015-08-13 NOTE — Progress Notes (Signed)
[place] Progress Note Patient Name: [name] DOB: Jul 15, 1945 MRN: [id number]   Date of Service  08/13/2015  HPI/Events of Note  K+ = 5.9, Mg++ = 1.6 and Creatinine = 2.06.  eICU Interventions  Will order: 1. Replete Mg++. 2. Kayexalate 30 gm via NGT now.      Intervention Category Major Interventions: Electrolyte abnormality - evaluation and management  Dilon Lank [place] 08/13/2015, 5:05 AM

## 2015-08-13 NOTE — Progress Notes (Signed)
Sputum sample obtained and sent down to main lab without any complications.  

## 2015-08-13 NOTE — Progress Notes (Signed)
[place] Progress Note Patient Name: [name] DOB: [birth year] MRN: [id number]   Date of Service  08/13/2015  HPI/Events of Note  Request for post intubation CXR.  eICU Interventions  Will order portable CXR now.      Intervention Category Major Interventions: Respiratory failure - evaluation and management  [name] 08/13/2015, 6:53 AM

## 2015-08-13 NOTE — Progress Notes (Signed)
Utilization review completed.  

## 2015-08-13 NOTE — Progress Notes (Signed)
CRITICAL VALUE ALERT  Critical value received:  Lactic acid 2.4  Date of notification:  08/13/2015  Time of notification:  [id number]  Critical value read back: Yes  Nurse who received alert:  Olene Floss, RN  MD notified (1st page):  Dr. Melvyn Novas (elink)  Time of first page:  Direct call

## 2015-08-13 NOTE — H&P (Signed)
PULMONARY / CRITICAL CARE MEDICINE   Name: [name] MRN: [id number] DOB: 06/27/1945    ADMISSION DATE:  08/12/2015  CHIEF COMPLAINT:  Chest pain   HISTORY OF PRESENT ILLNESS:   Patient is intubated and sedated with no family members to assist with history. 70 yo male with MMP including HFrEF (EF30%), Afib, COPD, HTN, CKD who presents from [facility] Chest pain which was persistent from time of recent D/C from [facility] hospital on 3/22 when he was admitted for Afib with RVR and HCAP. He completed a course of ABX and prednisone for COPD exacerbation.  SUBJECTIVE: Patient developed atrial fibrillation yesterday and was placed on sedation as well as IV metoprolol when necessary.  REVIEW OF SYSTEMS:  Unable to obtain given intubation and sedation.  VITAL SIGNS: Temp:  [97.8 F (36.6 C)-99 F (37.2 C)] 98.5 F (36.9 C) (03/26 0748) Pulse Rate:  [25-157] 58 (03/26 0800) Resp:  [13-22] 17 (03/26 0800) BP: (74-129)/(56-110) 90/75 mmHg (03/26 0800) SpO2:  [83 %-100 %] 100 % (03/26 0800) FiO2 (%):  [30 %-50 %] 30 % (03/26 0800) HEMODYNAMICS:   VENTILATOR SETTINGS: Vent Mode:  [-] PSV;CPAP FiO2 (%):  [30 %-50 %] 30 % Set Rate:  [15 bmp] 15 bmp Vt Set:  [670 mL] 670 mL PEEP:  [5 cmH20] 5 cmH20 Pressure Support:  [5 cmH20] 5 cmH20 Plateau Pressure:  [14 cmH20-20 cmH20] 16 cmH20 INTAKE / OUTPUT:  Intake/Output Summary (Last 24 hours) at 08/13/15 [id number] Last data filed at 08/13/15 0800  Gross per 24 hour  Intake 2025.68 ml  Output    750 ml  Net 1275.68 ml    PHYSICAL EXAMINATION: General:  Awake. No acute distress. No family at bedside.  Integument:  Warm & dry. No rash on exposed skin.  HEENT:  No scleral injection or icterus. Endotracheal tube in place.  Cardiovascular:  Irregular rhythm & slightly tachycardic. No edema. No appreciable JVD.  Pulmonary:  Coarse breath sounds bilaterally. Symmetric chest wall rise on ventilator. Abdomen: Soft. Normal bowel sounds. Nondistended.   Neurological: Following commands. Moving all 4 extremities. Grossly nonfocal.  LABS:  CBC  Recent Labs Lab 08/07/15 0504 08/12/15 0427 08/13/15 0330  WBC 15.8* 15.3* 14.7*  HGB 14.0 13.8 12.5*  HCT 45.2 44.3 39.7  PLT 235 177 158   Coag's  Recent Labs Lab 08/12/15 0849 08/12/15 1813 08/13/15 0330  APTT 36 67* 76*   BMET  Recent Labs Lab 08/08/15 0628 08/12/15 0427 08/13/15 0330  NA 145 147* 148*  K 5.9* 5.8* 5.9*  CL 113* 116* 119*  CO2 20* 23 20*  BUN 82* 54* 59*  CREATININE 2.49* 1.86* 2.06*  GLUCOSE 111* 107* 103*   Electrolytes  Recent Labs Lab 08/08/15 0628 08/12/15 0427 08/12/15 0847 08/13/15 0330  CALCIUM 8.9 8.4*  --  8.6*  MG  --   --  1.6* 1.6*  PHOS  --   --  4.6 4.4   Sepsis Markers  Recent Labs Lab 08/12/15 0447 08/12/15 0735  LATICACIDVEN 0.91 2.02*   ABG  Recent Labs Lab 08/12/15 0637  PHART 7.263*  PCO2ART 50.1*  PO2ART 405.0*   Liver Enzymes  Recent Labs Lab 08/08/15 0628 08/12/15 0427  AST 62* 41  ALT 127* 91*  ALKPHOS 62 70  BILITOT 0.6 0.5  ALBUMIN 3.0* 2.8*   Cardiac Enzymes  Recent Labs Lab 08/12/15 0847 08/12/15 1330 08/12/15 1813  TROPONINI 0.09* 0.08* 0.07*   Glucose  Recent Labs Lab 08/12/15 0511 08/12/15 [id number]  GLUCAP 104* 83    Imaging No results found.  EVENTS: 3/25 - Admit  STUDIES: Port CXR 3/26:  Endotracheal tube in good position. Questionable left lower lobe opacity with silhouetting of the diaphragm.  MICROBIOLOGY: Tracheal Asp Ctx 3/26>>> Respiratory Viral Panel PCR 3/26>>> Urine Ctx 3/25>>> Blood Ctx x2 3/25>>> Urine Legionella Ag 3/25>>> Urine Strep Ag 3/25:  Negative   Sputum Ctx 3/11:  MRSA Influenza PCR 3/10:  Negative  ANTIBIOTICS: Vancomycin 3/25>>> Cefepime 3/25>>>  LINES/TUBES: OETT 8.0 3/25>>> FOLEY 3/25>>> OGT 3/25>>> R IJ CVL 3/25>>> PIV x1  ASSESSMENT / PLAN:  PULMONARY A: Acute Hypercarbic Respiratory Failure HCAP/LLL opacity COPD  with acute exacerbation (FEV1 71% in 2012) Cardiogenic pulmonary edema - From RVR.  P:   Full Vent Support Solu-medrol 40mg  IV q6hr Xopenex & Atrovent nebs q6hr SBT & WUA tomorrow morning  INFECTIOUS A:   Sepsis - Likely HCAP. H/O MRSA in Sputum 3/11.  P:   Vancomycin & Cefepime Day #2 Awaiting Cultures & Respiratory Viral Panel PCR Awaiting Urine Legionella Ag Procalcitonin per algorithm  CARDIOVASCULAR A:  Atrial Fibrillation w/ RVR - Now controlled. On Digoxin & Eliquis. Elevated Troponin I - Likely demand ischemia Shock - Peri-intubation. Resolved.  P:  Diltiazem gtt for rate control Heparin gtt per pharmacy protocol Metoprolol bid ASA qday Hold Digoxin (level 0.5) Checking TTE  RENAL A:   CKD - Scr at baseline Hypernatremia - Mild but increasing. Hyperkalemia - Mild. Lactic Acidosis - Mild. Hypomagnesemia - Replacing.  P:   Magnesium Sulfate 2gm IV Trending LA q6hr Monitoring UOP with foley Trending renal function & electrolytes daily S/P Kayexalate  Repeat BMP @ 1700 today  GASTROINTESTINAL A:   Transaminitis - Mild. H/O GERD & Gastritis  P:   Protonix IV daily Repeat LFTs in AM NPO  HEMATOLOGIC A:   Leukocytosis - Stable. Chronic Anticoagulation - Eliquis.  P:  Trending cell counts daily w/ CBC Heparin gtt per pharmacy protocol  ENDOCRINE A:   No acute issues.  P:   Monitor BG on daily labs  NEUROLOGIC A:   Acute Encephalopathy - Likely secondary to hypercarbia vs toxic metabolic Sedation on Ventilator H/O Depression & Bipolar D/O  P:   RASS goal: 0 to -1 Wellbutrin bid Remeron qhs Fentanyl gtt & IV prn Versed IV prn  FAMILY DISCUSSION:  Daughter Davy Pique Long updated via phone.  TODAY'S SUMMARY:  70 yo male with MMP here with CP, mildly elevated troponin and possible HCAP vs iatrogenic pulmonary edema secondary to atrial fibrillation with rapid ventricular response. Clinically patient continuing to improve. Increasing  Solu-Medrol for treatment of hypercarbia/COPD exacerbation. Plan for spontaneous breathing trial in the morning with possible extubation.  I have spent a total of 33 minutes of critical care time caring for the patient, updating daughter via phone, & reviewing the patient's electronic medical record.  Sonia Baller Ashok Cordia, M.D. [facility] Pulmonary & Critical Care Pager:  [phone] After 3pm or if no response, call [phone]  08/13/2015, 9:22 AM

## 2015-08-14 ENCOUNTER — Inpatient Hospital Stay (HOSPITAL_COMMUNITY): Payer: Commercial Managed Care - HMO

## 2015-08-14 DIAGNOSIS — I4891 Unspecified atrial fibrillation: Secondary | ICD-10-CM

## 2015-08-14 DIAGNOSIS — J9622 Acute and chronic respiratory failure with hypercapnia: Secondary | ICD-10-CM

## 2015-08-14 DIAGNOSIS — J189 Pneumonia, unspecified organism: Secondary | ICD-10-CM

## 2015-08-14 LAB — CBC WITH DIFFERENTIAL/PLATELET
Basophils Absolute: 0 10*3/uL (ref 0.0–0.1)
Basophils Relative: 0 %
EOS ABS: 0 10*3/uL (ref 0.0–0.7)
EOS PCT: 0 %
HCT: 38.1 % — ABNORMAL LOW (ref 39.0–52.0)
Hemoglobin: 12.1 g/dL — ABNORMAL LOW (ref 13.0–17.0)
LYMPHS ABS: 1 10*3/uL (ref 0.7–4.0)
Lymphocytes Relative: 10 %
MCH: 29 pg (ref 26.0–34.0)
MCHC: 31.8 g/dL (ref 30.0–36.0)
MCV: 91.4 fL (ref 78.0–100.0)
MONO ABS: 0.1 10*3/uL (ref 0.1–1.0)
Monocytes Relative: 1 %
NEUTROS PCT: 89 %
Neutro Abs: 8.9 10*3/uL — ABNORMAL HIGH (ref 1.7–7.7)
PLATELETS: 146 10*3/uL — AB (ref 150–400)
RBC: 4.17 MIL/uL — AB (ref 4.22–5.81)
RDW: 15.4 % (ref 11.5–15.5)
WBC: 10 10*3/uL (ref 4.0–10.5)

## 2015-08-14 LAB — RENAL FUNCTION PANEL
ALBUMIN: 2.7 g/dL — AB (ref 3.5–5.0)
Anion gap: 11 (ref 5–15)
BUN: 62 mg/dL — AB (ref 6–20)
CO2: 20 mmol/L — ABNORMAL LOW (ref 22–32)
Calcium: 8.9 mg/dL (ref 8.9–10.3)
Chloride: 117 mmol/L — ABNORMAL HIGH (ref 101–111)
Creatinine, Ser: 2.42 mg/dL — ABNORMAL HIGH (ref 0.61–1.24)
GFR calc Af Amer: 29 mL/min — ABNORMAL LOW (ref >60)
GFR, EST NON AFRICAN AMERICAN: 25 mL/min — AB (ref >60)
Glucose, Bld: 146 mg/dL — ABNORMAL HIGH (ref 65–99)
PHOSPHORUS: 4.7 mg/dL — AB (ref 2.5–4.6)
POTASSIUM: 4.9 mmol/L (ref 3.5–5.1)
Sodium: 148 mmol/L — ABNORMAL HIGH (ref 135–145)

## 2015-08-14 LAB — ECHOCARDIOGRAM COMPLETE
Height: 75 in
Weight: 2850.11 oz

## 2015-08-14 LAB — MAGNESIUM: MAGNESIUM: 2.1 mg/dL (ref 1.7–2.4)

## 2015-08-14 LAB — HEPATIC FUNCTION PANEL
ALT: 68 U/L — AB (ref 17–63)
AST: 24 U/L (ref 15–41)
Albumin: 2.7 g/dL — ABNORMAL LOW (ref 3.5–5.0)
Alkaline Phosphatase: 66 U/L (ref 38–126)
BILIRUBIN INDIRECT: 0.6 mg/dL (ref 0.3–0.9)
Bilirubin, Direct: 0.2 mg/dL (ref 0.1–0.5)
TOTAL PROTEIN: 5.3 g/dL — AB (ref 6.5–8.1)
Total Bilirubin: 0.8 mg/dL (ref 0.3–1.2)

## 2015-08-14 LAB — LEGIONELLA PNEUMOPHILA SEROGP 1 UR AG: L. pneumophila Serogp 1 Ur Ag: NEGATIVE

## 2015-08-14 LAB — PROCALCITONIN: Procalcitonin: <0.1 ng/mL

## 2015-08-14 LAB — APTT: APTT: 66 s — AB (ref 24–37)

## 2015-08-14 LAB — HEPARIN LEVEL (UNFRACTIONATED): Heparin Unfractionated: 1.04 IU/mL — ABNORMAL HIGH (ref 0.30–0.70)

## 2015-08-14 MED ORDER — ACETAMINOPHEN 325 MG PO TABS
650.0000 mg | ORAL_TABLET | Freq: Four times a day (QID) | ORAL | Status: DC | PRN
  Administered 2015-08-15: 650 mg via ORAL
  Filled 2015-08-14: qty 2

## 2015-08-14 MED ORDER — METHYLPREDNISOLONE SODIUM SUCC 40 MG IJ SOLR
40.0000 mg | Freq: Two times a day (BID) | INTRAMUSCULAR | Status: DC
  Administered 2015-08-14 – 2015-08-15 (×2): 40 mg via INTRAVENOUS
  Filled 2015-08-14 (×3): qty 1

## 2015-08-14 MED ORDER — LEVALBUTEROL HCL 0.63 MG/3ML IN NEBU
0.6300 mg | INHALATION_SOLUTION | Freq: Three times a day (TID) | RESPIRATORY_TRACT | Status: DC
  Administered 2015-08-14 – 2015-08-18 (×14): 0.63 mg via RESPIRATORY_TRACT
  Filled 2015-08-14 (×18): qty 3

## 2015-08-14 MED ORDER — IPRATROPIUM BROMIDE 0.02 % IN SOLN
0.5000 mg | Freq: Three times a day (TID) | RESPIRATORY_TRACT | Status: DC
  Administered 2015-08-14 – 2015-08-18 (×14): 0.5 mg via RESPIRATORY_TRACT
  Filled 2015-08-14 (×14): qty 2.5

## 2015-08-14 NOTE — NC FL2 (Signed)
[place] LEVEL OF CARE SCREENING TOOL     IDENTIFICATION  Patient Name: [name] Birthdate: [birth year] Sex: male Admission Date (Current Location): 08/12/2015  [facility] and [place] Number:  Herbalist and Address:  The . [facility], Sam [place] [address], [place], [place] [address]      Provider Number: [id number]  Attending Physician Name and Address:  Roswell Nickel, MD  Relative Name and Phone Number:       Current Level of Care: Hospital Recommended Level of Care: [place] Prior Approval Number:    Date Approved/Denied:   PASRR Number: [id number]  Discharge Plan: SNF    Current Diagnoses: Patient Active Problem List   Diagnosis Date Noted  . Malnutrition of moderate degree 08/13/2015  . Pressure ulcer 08/13/2015  . Sepsis ([place]) 08/12/2015  . Ileus ([place])   . Hyperkalemia   . Intestinal obstruction ([place])   . Normal coronary arteries-2003 07/31/2015  . Demand ischemia ([place]) 07/31/2015  . Atrial fibrillation with RVR ([place]) 07/29/2015  . Acute systolic congestive heart failure ([place])   . CAP (community acquired pneumonia) 07/28/2015  . Long term current use of opiate analgesic 06/27/2015  . Acute-on-chronic kidney injury ([place]) 06/25/2015  . Decreased appetite 06/19/2015  . Neuropathic pain of both feet ([place]) 05/03/2015  . Heel pain 03/02/2015  . Chronic pain 12/13/2014  . Sepsis secondary to UTI ([place]) 11/12/2014  . Protein-calorie malnutrition, severe ([place]) 10/26/2014  . Longstanding persistent atrial fibrillation ([place]) 10/11/2014  . STEMI (ST elevation myocardial infarction) ([place]) 10/11/2014  . TIA (transient ischemic attack) 10/11/2014  . Unspecified constipation 01/25/2014  . Healthcare maintenance 01/25/2014  . BPH (benign prostatic hyperplasia) 01/20/2014  . PVD (peripheral vascular disease) ([place]) 12/10/2013  . Decreased hearing 09/08/2013  . Rash and nonspecific skin eruption 08/13/2013  .  Non-ischemic cardiomyopathy ([place]) 05/17/2013  . Weight loss 03/19/2012  . Insomnia 11/19/2010  . Chronic combined systolic and diastolic congestive heart failure ([place]) 06/20/2010  . Long-term (current) use of anticoagulants 06/05/2010  . H/O Arterial embolism of leg  06/05/2010  . Depression 01/19/2010  . Chronic kidney disease (CKD), stage III (moderate) 11/18/2009  . History of hyperthyroidism 03/13/2009  . Bilateral hip pain 03/23/2007  . INTERMITTENT CLAUDICATION 10/24/2006  . OSTEOARTHRITIS 09/05/2006  . G E R D 09/04/2006  . HYDROCELE NOS 09/04/2006  . COPD (chronic obstructive pulmonary disease) ([place]) 08/28/2006  . BACK PAIN 06/02/2006  . ERECTILE DYSFUNCTION 03/01/2006  . History of tobacco abuse 03/01/2006  . Essential hypertension 03/01/2006  . Cardiac pacemaker in situ 03/01/2006  . SPONDYLOSIS 03/01/2006    Orientation RESPIRATION BLADDER Height & Weight     Self, Time, Situation, Place  O2 (2L) Indwelling catheter Weight: 178 lb 2.1 oz (80.8 kg) Height:  _0  (190.5 cm)  BEHAVIORAL SYMPTOMS/MOOD NEUROLOGICAL BOWEL NUTRITION STATUS      Continent Diet  AMBULATORY STATUS COMMUNICATION OF NEEDS Skin   Extensive Assist Verbally PU Stage and Appropriate Care   PU Stage 2 Dressing:  (Left Buttocks - PRN)                   Personal Care Assistance Level of Assistance  Bathing, Feeding, Dressing Bathing Assistance: Limited assistance Feeding assistance: Limited assistance Dressing Assistance: Limited assistance     Functional Limitations Info  Sight, Hearing, Speech Sight Info: Adequate Hearing Info: Adequate Speech Info: Adequate    SPECIAL CARE FACTORS FREQUENCY  PT (By licensed PT)  PT Frequency: 3              Contractures Contractures Info: Not present    Additional Factors Info  Code Status, Isolation Precautions Code Status Info: Full Allergies Info: Amiodarone, Penicillins     Isolation Precautions Info: MRSA + in sputum and by  pcr 07/29/15 (Droplet Precaution)     Current Medications (08/14/2015):  This is the current hospital active medication list Current Facility-Administered Medications  Medication Dose Route Frequency Provider Last Rate Last Dose  . 0.9 %  sodium chloride infusion  250 mL Intravenous PRN Roswell Nickel, MD      . antiseptic oral rinse solution (CORINZ)  7 mL Mouth Rinse QID Roswell Nickel, MD   7 mL at 08/14/15 0415  . aspirin chewable tablet 81 mg  81 mg Oral Daily Roswell Nickel, MD   81 mg at 08/14/15 1138  . bisacodyl (DULCOLAX) suppository 10 mg  10 mg Rectal Daily PRN Roswell Nickel, MD      . buPROPion [name]) tablet 75 mg  75 mg Oral BID Roswell Nickel, MD   75 mg at 08/14/15 1000  . ceFEPIme (MAXIPIME) 2 g in dextrose 5 % 50 mL IVPB  2 g Intravenous Q24H Norva Riffle, RPH   2 g at 08/14/15 0300  . chlorhexidine gluconate (SAGE KIT) (PERIDEX) 0.12 % solution 15 mL  15 mL Mouth Rinse BID Roswell Nickel, MD   15 mL at 08/14/15 0759  . cholecalciferol (VITAMIN D) tablet 1,000 Units  1,000 Units Oral Daily Roswell Nickel, MD   1,000 Units at 08/14/15 1138  . diltiazem (CARDIZEM) 100 mg in dextrose 5 % 100 mL (1 mg/mL) infusion  5-15 mg/hr Intravenous Titrated Juanito Doom, MD 15 mL/hr at 08/14/15 1100 15 mg/hr at 08/14/15 1100  . heparin ADULT infusion 100 units/mL (25000 units/250 mL)  1,000 Units/hr Intravenous Continuous Laren Everts, RPH 10 mL/hr at 08/14/15 1016 1,000 Units/hr at 08/14/15 1016  . ipratropium (ATROVENT) nebulizer solution 0.5 mg  0.5 mg Nebulization TID Kara Mead V, MD   0.5 mg at 08/14/15 0908  . levalbuterol (XOPENEX) nebulizer solution 0.63 mg  0.63 mg Nebulization TID Rigoberto Noel, MD   0.63 mg at 08/14/15 0908  . methylPREDNISolone sodium succinate (SOLU-MEDROL) 40 mg/mL injection 40 mg  40 mg Intravenous Q12H Kara Mead V, MD      . metoprolol (LOPRESSOR) injection 2.5 mg  2.5 mg Intravenous Q3H PRN Juanito Doom, MD   2.5 mg at 08/12/15  0205  . metoprolol tartrate (LOPRESSOR) tablet 25 mg  25 mg Oral BID Roswell Nickel, MD   25 mg at 08/14/15 1138  . mirtazapine (REMERON) tablet 15 mg  15 mg Oral QHS Roswell Nickel, MD   15 mg at 08/13/15 2251  . pantoprazole (PROTONIX) injection 40 mg  40 mg Intravenous Q24H Javier Glazier, MD   40 mg at 08/14/15 0949  . senna-docusate (Senokot-S) tablet 1 tablet  1 tablet Oral Daily PRN Roswell Nickel, MD      . vancomycin (VANCOCIN) 1,250 mg in sodium chloride 0.9 % 250 mL IVPB  1,250 mg Intravenous Q24H Norva Riffle, RPH   1,250 mg at 08/14/15 3013     Discharge Medications: Please see discharge summary for a list of discharge medications.  Relevant Imaging Results:  Relevant Lab Results:   Additional Information [place], [place]

## 2015-08-14 NOTE — Progress Notes (Signed)
PULMONARY / CRITICAL CARE MEDICINE   Name: [name] MRN: [id number] DOB: Mar 15, 1946    ADMISSION DATE:  08/12/2015  CHIEF COMPLAINT:  Chest pain   HISTORY OF PRESENT ILLNESS:    70 yo male with MMP including HFrEF (EF30%), Afib, COPD, HTN, CKD who presents from [facility] Chest pain which was persistent from time of recent D/C from [facility] on 3/22 when he was admitted for Afib with RVR and HCAP. He completed a course of ABX and prednisone for COPD exacerbation.  SUBJECTIVE: denies CP, dyspnea Afebrile UO poor  REVIEW OF SYSTEMS:  Unable to obtain given intubation and sedation.  VITAL SIGNS: Temp:  [97.2 F (36.2 C)-98.3 F (36.8 C)] 98 F (36.7 C) (03/27 0400) Pulse Rate:  [25-139] 139 (03/27 0847) Resp:  [15-22] 21 (03/27 0847) BP: (85-146)/(46-120) 112/87 mmHg (03/27 0847) SpO2:  [74 %-100 %] 100 % (03/27 0847) FiO2 (%):  [30 %] 30 % (03/27 0800) Weight:  [178 lb 2.1 oz (80.8 kg)] 178 lb 2.1 oz (80.8 kg) (03/27 0422) HEMODYNAMICS:   VENTILATOR SETTINGS: Vent Mode:  [-] PSV;CPAP FiO2 (%):  [30 %] 30 % Set Rate:  [15 bmp] 15 bmp Vt Set:  [670 mL] 670 mL PEEP:  [5 cmH20] 5 cmH20 Pressure Support:  [5 cmH20-8 cmH20] 5 cmH20 Plateau Pressure:  [16 cmH20-21 cmH20] 21 cmH20 INTAKE / OUTPUT:  Intake/Output Summary (Last 24 hours) at 08/14/15 0851 Last data filed at 08/14/15 0800  Gross per 24 hour  Intake 844.88 ml  Output   1017 ml  Net -172.12 ml    PHYSICAL EXAMINATION: General:  Awake. No acute distress. Chr ill Integument:  Warm & dry. No rash on exposed skin.  HEENT:  No scleral injection or icterus. Endotracheal tube in place.  Cardiovascular:  Irregular rhythm & slightly tachycardic. No edema. No appreciable JVD.  Pulmonary:  Coarse breath sounds bilaterally. Symmetric chest wall rise on ventilator. Abdomen: Soft. Normal bowel sounds. Nondistended.  Neurological: Following commands. Moving all 4 extremities. Grossly  nonfocal.  LABS:  CBC  Recent Labs Lab 08/12/15 0427 08/13/15 0330 08/14/15 0420  WBC 15.3* 14.7* 10.0  HGB 13.8 12.5* 12.1*  HCT 44.3 39.7 38.1*  PLT 177 158 146*   Coag's  Recent Labs Lab 08/12/15 1813 08/13/15 0330 08/14/15 0420  APTT 67* 76* 66*   BMET  Recent Labs Lab 08/13/15 0330 08/13/15 1719 08/14/15 0420  NA 148* 149* 148*  K 5.9* 5.7* 4.9  CL 119* 118* 117*  CO2 20* 20* 20*  BUN 59* 62* 62*  CREATININE 2.06* 2.55* 2.42*  GLUCOSE 103* 161* 146*   Electrolytes  Recent Labs Lab 08/12/15 0847 08/13/15 0330 08/13/15 1719 08/14/15 0420  CALCIUM  --  8.6* 9.1 8.9  MG 1.6* 1.6*  --  2.1  PHOS 4.6 4.4  --  4.7*   Sepsis Markers  Recent Labs Lab 08/12/15 0735 08/13/15 1017 08/13/15 1029 08/13/15 1715 08/14/15 0420  LATICACIDVEN 2.02* 1.7  --  2.4*  --   PROCALCITON  --   --  <0.10  --  <0.10   ABG  Recent Labs Lab 08/12/15 0637  PHART 7.263*  PCO2ART 50.1*  PO2ART 405.0*   Liver Enzymes  Recent Labs Lab 08/08/15 0628 08/12/15 0427 08/14/15 0420  AST 62* 41 24  ALT 127* 91* 68*  ALKPHOS 62 70 66  BILITOT 0.6 0.5 0.8  ALBUMIN 3.0* 2.8* 2.7*  2.7*   Cardiac Enzymes  Recent Labs Lab 08/12/15 0847 08/12/15 1330  08/12/15 1813  TROPONINI 0.09* 0.08* 0.07*   Glucose  Recent Labs Lab 08/12/15 0511 08/12/15 0727  GLUCAP 104* 83    Imaging No results found.  EVENTS: 3/25 - Admit  STUDIES: Port CXR 3/27:  Endotracheal tube in good position. Questionable left lower lobe opacity with silhouetting of the diaphragm.  MICROBIOLOGY: Tracheal Asp Ctx 3/26>>> Respiratory Viral Panel PCR 3/26>>> Urine Ctx 3/25>>> Blood Ctx x2 3/25>>> Urine Legionella Ag 3/25>>> Urine Strep Ag 3/25:  Negative   Sputum Ctx 3/11:  MRSA Influenza PCR 3/10:  Negative  ANTIBIOTICS: Vancomycin 3/25>>> Cefepime 3/25>>>  LINES/TUBES: OETT 8.0 3/25>>>3/27 FOLEY 3/25>>> OGT 3/25>>> R IJ CVL 3/25>>> PIV x1  ASSESSMENT /  PLAN:  PULMONARY A: Acute Hypercarbic Respiratory Failure HCAP/LLL opacity COPD with acute exacerbation (FEV1 71% in 2012) Cardiogenic pulmonary edema - From RVR.  P:   SBTs tolerated >> extubate Solu-medrol 40mg  IV q12hr - taper to off Xopenex & Atrovent nebs tid - change to prn soon   INFECTIOUS A:   Sepsis - Likely HCAP. H/O MRSA in Sputum 3/11.  P:   Vancomycin & Cefepime Day #2 Awaiting Cultures & Respiratory Viral Panel PCR Awaiting Urine Legionella Ag Procalcitonin low reassuring  CARDIOVASCULAR A:  Atrial Fibrillation w/ RVR - Now controlled. On Digoxin & Eliquis. Elevated Troponin I - Likely demand ischemia Shock - Peri-intubation. Resolved. 09/2014 EF 30% P:  Diltiazem gtt for rate control Heparin gtt per pharmacy protocol Metoprolol bid ASA qday Hold Digoxin (level 0.5) Checking TTE  RENAL A:   CKD - Scr at baseline Hypernatremia - Mild but increasing. Hyperkalemia - resolved Lactic Acidosis - Mild. Hypomagnesemia - Replaced  P:   Monitoring UOP with foley Trending renal function & electrolytes daily   GASTROINTESTINAL A:   Transaminitis - Mild. H/O GERD & Gastritis  P:   Protonix IV daily Repeat LFTs in AM Advance PO  HEMATOLOGIC A:   Leukocytosis - Stable. Chronic Anticoagulation - Eliquis.  P:  Trending cell counts daily w/ CBC Heparin gtt per pharmacy protocol  ENDOCRINE A:   No acute issues.  P:   Monitor BG on daily labs  NEUROLOGIC A:   Acute Encephalopathy - Likely secondary to hypercarbia vs toxic metabolic H/O Depression & Bipolar D/O  P:   RASS goal: 0 Wellbutrin bid Remeron qhs Dc sedation  FAMILY DISCUSSION:  Daughter Davy Pique Long updated via phone3/26  TODAY'S SUMMARY:  70 yo male with MMP here with CP, mildly elevated troponin and possible HCAP vs iatrogenic pulmonary edema secondary to atrial fibrillation with rapid ventricular response. Clinically improved, extubated. Increasing Solu-Medrol for  treatment of hypercarbia/COPD exacerbation.   The patient is critically ill with multiple organ systems failure and requires high complexity decision making for assessment and support, frequent evaluation and titration of therapies, application of advanced monitoring technologies and extensive interpretation of multiple databases. Critical Care Time devoted to patient care services described in this note independent of APP time is 35 minutes.    Kara Mead MD. Shade Flood. [place] Pager [phone] If no response call [phone]   08/14/2015    08/14/2015, 8:51 AM

## 2015-08-14 NOTE — Progress Notes (Signed)
  Echocardiogram 2D Echocardiogram has been performed.  [name] 08/14/2015, 1:00 PM

## 2015-08-14 NOTE — Consult Note (Signed)
   [facility] Inpatient Consult   08/14/2015  [name] 1945/06/24 [id number] Patient is currently active with [facility] for chronic disease management services through his [facility].  Patient has been engaged by a [employer] and CSW. Patient was previously admitted to [facility] facility.   Our community based plan of care has focused on disease management and community resource support.  Patient will receive a post discharge transition of care call and will be evaluated for monthly home visits for assessments and disease process education.  Will follow up with Inpatient Case Manager staff to make aware that [facility] following. Of note, [facility] Care Management services does not replace or interfere with any services that are needed or arranged by inpatient case management or social work.  For additional questions or referrals please contact:  Natividad Brood, RN BSN [facility] Liaison  [phone] business mobile phone Toll free office [phone]

## 2015-08-14 NOTE — Procedures (Signed)
Extubation Procedure Note  Patient Details:   Name: DAYLYN NAVEDO DOB: Feb 11, 1946 MRN: [id number]   Airway Documentation:     Evaluation  O2 sats: stable throughout Complications: No apparent complications Patient did tolerate procedure well. Bilateral Breath Sounds: Clear   Yes   Patient extubated to 2L nasal cannula per MD order.  Positive cuff leak noted.  No evidence of stridor.  Patient able to speak post extubation.  Sats currenty 100%.  Vitals are stable.  No apparent complications.   Philomena Doheny 08/14/2015, 8:48 AM

## 2015-08-14 NOTE — Clinical Social Work Note (Signed)
Clinical Social Work Assessment  Patient Details  Name: [name] MRN: [id number] Date of Birth: July 16, 1945  Date of referral:  08/14/15               Reason for consult:  Facility Placement, Discharge Planning                Permission sought to share information with:  Family Supports, Customer service manager Permission granted to share information::  Yes, Verbal Permission Granted  Name::     Journalist, newspaper::  SNFs  Relationship::  Daughter  Contact Information:  [phone]  Housing/Transportation Living arrangements for the past 2 months:  Mobile Home, [place] of Information:  Patient Patient Interpreter Needed:  None Criminal Activity/Legal Involvement Pertinent to Current Situation/Hospitalization:  No - Comment as needed Significant Relationships:  Adult Children, Significant Other Lives with:  Self Do you feel safe going back to the place where you live?  Yes Need for family participation in patient care:  Yes (Comment)  Care giving concerns:  Patient lives at home alone and is in need of SNF for short term rehab.    Social Worker assessment / plan:  BSW intern met with patient and patient fiance Jules Schick [phone]) at bedside to complete assessment. BSW intern inquired about sending patient back to [facility]. Patient stated that he did not want to return to [facility] at discharge. Patient requested that BSW intern contact [facility] and [facility] SNFs. Patient was also agreeable to sending information to other facilities in the area. Patient does not want information sent to [facility] or [facility]. Patient requested that Daughter [name] [phone]) be first contact and then contact [facility] and then son Derrell Lolling. BSW intern to follow up with bed offers. BSW intern will continue to follow and assist as needed.  Employment status:  Retired Forensic scientist:  [employer] PT Recommendations:   [place] / Referral to community resources:  [place]  Patient/Family's Response to care:  Patient and patient fiance appear to be happy with the care the patient is currently receiving at the hospital.  Patient/Family's Understanding of and Emotional Response to Diagnosis, Current Treatment, and Prognosis:  Patient seemed to have good understanding that short term rehab at a SNF would be beneficial before returning home. Patient appeared to understand current treatment and post discharge needs.  Emotional Assessment Appearance:  Appears stated age Attitude/Demeanor/Rapport:   (Appropriate) Affect (typically observed):  Appropriate, Pleasant, Calm Orientation:  Oriented to Self, Oriented to Place, Oriented to  Time, Oriented to Situation Alcohol / Substance use:  Not Applicable Psych involvement (Current and /or in the community):  No (Comment)  Discharge Needs  Concerns to be addressed:  No discharge needs identified Readmission within the last 30 days:  Yes Current discharge risk:  Lives alone Barriers to Discharge:  Continued Medical Work up    [employer], [phone]

## 2015-08-14 NOTE — Progress Notes (Signed)
[facility] for heparin Indication: atrial fibrillation  Allergies  Allergen Reactions  . 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 Penicillins Rash    Patient Measurements: Height: 6\' 3"  (190.5 cm) Weight: 178 lb 2.1 oz (80.8 kg) IBW/kg (Calculated) : 84.5  Vital Signs: Temp: 98 F (36.7 C) (03/27 0400) Temp Source: Axillary (03/27 0400) BP: 88/65 mmHg (03/27 0713) Pulse Rate: 109 (03/27 0713)  Labs:  Recent Labs  08/12/15 0427 08/12/15 0847  08/12/15 0849 08/12/15 1330 08/12/15 1813 08/13/15 0330 08/13/15 1719 08/14/15 0420  HGB 13.8  --   --   --   --   --  12.5*  --  12.1*  HCT 44.3  --   --   --   --   --  39.7  --  38.1*  PLT 177  --   --   --   --   --  158  --  146*  APTT  --   --   < > 36  --  67* 76*  --  66*  HEPARINUNFRC  --   --   --  1.08*  --   --  1.10*  --  1.04*  CREATININE 1.86*  --   --   --   --   --  2.06* 2.55* 2.42*  TROPONINI  --  0.09*  --   --  0.08* 0.07*  --   --   --   < > = values in this interval not displayed.  Estimated Creatinine Clearance: 32 mL/min (by C-G formula based on Cr of 2.42).   Assessment: 70yo male c/o worsening SOB and CP after recent admission for PNA, completed ABX course, now intubated now on heparin (he was on apixiban with last dose on 3/21 at 1630) and the baseline heparin level was elevated at 1.08 (will need to use aPTTs)  -APTT= 66 and at goal  Goal of Therapy:  Heparin level 0.3-0.7 units/ml aPTT 66-102 seconds Monitor platelets by anticoagulation protocol: Yes    Plan:  1. Continue heparin at current rate  2. Daily heparin level, aPTT until they correlate   Vincenza Hews, PharmD, BCPS 08/14/2015, 7:51 AM Pager: [phone]

## 2015-08-15 ENCOUNTER — Ambulatory Visit: Payer: Commercial Managed Care - HMO | Admitting: Podiatry

## 2015-08-15 DIAGNOSIS — T82199A Other mechanical complication of unspecified cardiac device, initial encounter: Secondary | ICD-10-CM

## 2015-08-15 DIAGNOSIS — Z452 Encounter for adjustment and management of vascular access device: Secondary | ICD-10-CM | POA: Insufficient documentation

## 2015-08-15 DIAGNOSIS — T82594A Other mechanical complication of infusion catheter, initial encounter: Secondary | ICD-10-CM | POA: Insufficient documentation

## 2015-08-15 DIAGNOSIS — E440 Moderate protein-calorie malnutrition: Secondary | ICD-10-CM

## 2015-08-15 DIAGNOSIS — J9600 Acute respiratory failure, unspecified whether with hypoxia or hypercapnia: Secondary | ICD-10-CM | POA: Insufficient documentation

## 2015-08-15 LAB — RENAL FUNCTION PANEL
ALBUMIN: 2.5 g/dL — AB (ref 3.5–5.0)
Anion gap: 8 (ref 5–15)
BUN: 70 mg/dL — AB (ref 6–20)
CALCIUM: 8.6 mg/dL — AB (ref 8.9–10.3)
CHLORIDE: 117 mmol/L — AB (ref 101–111)
CO2: 20 mmol/L — ABNORMAL LOW (ref 22–32)
CREATININE: 2.43 mg/dL — AB (ref 0.61–1.24)
GFR calc Af Amer: 29 mL/min — ABNORMAL LOW (ref >60)
GFR, EST NON AFRICAN AMERICAN: 25 mL/min — AB (ref >60)
Glucose, Bld: 168 mg/dL — ABNORMAL HIGH (ref 65–99)
Phosphorus: 4.5 mg/dL (ref 2.5–4.6)
Potassium: 4.1 mmol/L (ref 3.5–5.1)
SODIUM: 145 mmol/L (ref 135–145)

## 2015-08-15 LAB — CBC WITH DIFFERENTIAL/PLATELET
BASOS ABS: 0 10*3/uL (ref 0.0–0.1)
BASOS PCT: 0 %
EOS ABS: 0 10*3/uL (ref 0.0–0.7)
EOS PCT: 0 %
HCT: 35.9 % — ABNORMAL LOW (ref 39.0–52.0)
Hemoglobin: 11.3 g/dL — ABNORMAL LOW (ref 13.0–17.0)
Lymphocytes Relative: 6 %
Lymphs Abs: 1 10*3/uL (ref 0.7–4.0)
MCH: 29.4 pg (ref 26.0–34.0)
MCHC: 31.5 g/dL (ref 30.0–36.0)
MCV: 93.2 fL (ref 78.0–100.0)
MONO ABS: 0.3 10*3/uL (ref 0.1–1.0)
Monocytes Relative: 2 %
Neutro Abs: 16.9 10*3/uL — ABNORMAL HIGH (ref 1.7–7.7)
Neutrophils Relative %: 92 %
PLATELETS: 131 10*3/uL — AB (ref 150–400)
RBC: 3.85 MIL/uL — AB (ref 4.22–5.81)
RDW: 15.9 % — AB (ref 11.5–15.5)
WBC: 18.2 10*3/uL — AB (ref 4.0–10.5)

## 2015-08-15 LAB — MAGNESIUM: MAGNESIUM: 2 mg/dL (ref 1.7–2.4)

## 2015-08-15 LAB — CULTURE, RESPIRATORY W GRAM STAIN

## 2015-08-15 LAB — CULTURE, RESPIRATORY: CULTURE: NO GROWTH

## 2015-08-15 LAB — HEPARIN LEVEL (UNFRACTIONATED): HEPARIN UNFRACTIONATED: 0.8 [IU]/mL — AB (ref 0.30–0.70)

## 2015-08-15 LAB — APTT: aPTT: 46 seconds — ABNORMAL HIGH (ref 24–37)

## 2015-08-15 LAB — PROCALCITONIN

## 2015-08-15 MED ORDER — METOPROLOL TARTRATE 100 MG PO TABS
100.0000 mg | ORAL_TABLET | Freq: Two times a day (BID) | ORAL | Status: DC
  Administered 2015-08-15 – 2015-08-16 (×2): 100 mg via ORAL
  Filled 2015-08-15 (×3): qty 1

## 2015-08-15 MED ORDER — OXYCODONE-ACETAMINOPHEN 5-325 MG PO TABS
1.0000 | ORAL_TABLET | ORAL | Status: DC | PRN
  Administered 2015-08-15 – 2015-08-17 (×6): 1 via ORAL
  Filled 2015-08-15 (×6): qty 1

## 2015-08-15 MED ORDER — APIXABAN 2.5 MG PO TABS
2.5000 mg | ORAL_TABLET | Freq: Two times a day (BID) | ORAL | Status: DC
  Administered 2015-08-15 – 2015-08-18 (×7): 2.5 mg via ORAL
  Filled 2015-08-15 (×8): qty 1

## 2015-08-15 MED ORDER — POLYETHYLENE GLYCOL 3350 17 G PO PACK
17.0000 g | PACK | Freq: Two times a day (BID) | ORAL | Status: DC | PRN
  Administered 2015-08-17 – 2015-08-18 (×2): 17 g via ORAL
  Filled 2015-08-15 (×3): qty 1

## 2015-08-15 MED ORDER — METOPROLOL TARTRATE 1 MG/ML IV SOLN
5.0000 mg | Freq: Once | INTRAVENOUS | Status: AC
  Administered 2015-08-15: 5 mg via INTRAVENOUS
  Filled 2015-08-15: qty 5

## 2015-08-15 MED ORDER — DIGOXIN 0.25 MG/ML IJ SOLN
0.1250 mg | Freq: Every day | INTRAMUSCULAR | Status: DC
  Administered 2015-08-16: 0.125 mg via INTRAVENOUS
  Filled 2015-08-15: qty 1
  Filled 2015-08-15: qty 0.5

## 2015-08-15 MED ORDER — PANTOPRAZOLE SODIUM 40 MG PO TBEC
40.0000 mg | DELAYED_RELEASE_TABLET | Freq: Every day | ORAL | Status: DC
  Administered 2015-08-15 – 2015-08-18 (×4): 40 mg via ORAL
  Filled 2015-08-15 (×4): qty 1

## 2015-08-15 MED ORDER — DILTIAZEM HCL 60 MG PO TABS
60.0000 mg | ORAL_TABLET | Freq: Four times a day (QID) | ORAL | Status: DC
  Administered 2015-08-15 – 2015-08-16 (×3): 60 mg via ORAL
  Filled 2015-08-15 (×4): qty 1

## 2015-08-15 MED ORDER — METOPROLOL TARTRATE 50 MG PO TABS
50.0000 mg | ORAL_TABLET | Freq: Two times a day (BID) | ORAL | Status: DC
  Administered 2015-08-15: 50 mg via ORAL
  Filled 2015-08-15: qty 1

## 2015-08-15 MED ORDER — DIGOXIN 0.25 MG/ML IJ SOLN
0.2500 mg | Freq: Once | INTRAMUSCULAR | Status: AC
  Administered 2015-08-15: 0.25 mg via INTRAVENOUS
  Filled 2015-08-15: qty 1
  Filled 2015-08-15: qty 2

## 2015-08-15 MED ORDER — BOOST / RESOURCE BREEZE PO LIQD
1.0000 | Freq: Three times a day (TID) | ORAL | Status: DC
  Administered 2015-08-15 – 2015-08-18 (×9): 1 via ORAL

## 2015-08-15 MED ORDER — PREDNISONE 20 MG PO TABS
40.0000 mg | ORAL_TABLET | Freq: Every day | ORAL | Status: DC
  Administered 2015-08-16 – 2015-08-17 (×2): 40 mg via ORAL
  Filled 2015-08-15 (×2): qty 2

## 2015-08-15 MED ORDER — DIGOXIN 0.1 MG/ML IJ SOLN
0.5000 mg | Freq: Once | INTRAMUSCULAR | Status: AC
  Administered 2015-08-15: 0.5 mg via INTRAVENOUS
  Filled 2015-08-15: qty 5

## 2015-08-15 MED ORDER — OXYCODONE HCL 5 MG PO TABS
5.0000 mg | ORAL_TABLET | ORAL | Status: DC | PRN
  Administered 2015-08-15 – 2015-08-17 (×3): 5 mg via ORAL
  Filled 2015-08-15 (×3): qty 1

## 2015-08-15 NOTE — Progress Notes (Signed)
RN went to assess patient and patient started complaining of generalized pain, 7/10. Patient states he takes percocet at home and would like to resume it. RN notified MD. Orders received to administer tylenol. RN informed patient. Patient declined and states he only wants percocet "because that is the only thing I take that works for my pain." Patient states he has been taking percocet for 2 years now since his hip replacement. RN notified MD. Patient wished to speak to MD regarding this matter. RN placed patient on phone with MD. After patient finished speaking with MD, RN offered patient other comfort measures to help alleviate pain besides narcotics. Patient refused all other comfort measures (ice/heat packs, repositioning) and does not want to take tylenol. RN will continue to monitor patient closely.

## 2015-08-15 NOTE — Progress Notes (Signed)
Informed patient's daughter, Davy Pique, of patient's new bed placement.

## 2015-08-15 NOTE — Progress Notes (Signed)
Discussed HR 135 with md. Additional orders received.

## 2015-08-15 NOTE — Progress Notes (Signed)
Discussed pt heart rate Afib increased to 105 up to 145. Orders received, MD advised do not restart diltiazem

## 2015-08-15 NOTE — Progress Notes (Signed)
PT Cancellation Note  Patient Details Name: [name] MRN: [id number] DOB: 12-01-1945   Cancelled Treatment:    Reason Eval/Treat Not Completed: Patient declined, no reason specified Pt declined working with PT this morning reporting he has not slept all night due to not getting pain meds and wants to rest. Asked PT to return tomorrow. Will follow up.   Marguarite Arbour A Lorn Butcher 08/15/2015, 10:48 AM Wray Kearns, PT, DPT [phone]

## 2015-08-15 NOTE — Progress Notes (Addendum)
[facility] Progress Note Patient Name: [name] DOB: Jun 08, 1945 MRN: [id number]   Date of Service  08/15/2015  HPI/Events of Note  Multiple calls with patient demanding narcotics for generalized pain.  Patient was just extubated.  I am concerned about giving narcotics to a patient who had recurrent respiratory failure.  RN placed patient on the phone who informs me that tylenol is ineffective for him.  I offered him any anti-inflammatory he would like even toradol.  Any non-narcotic pain medication given recurrent episodes of respiratory failure but patient insisted on narcotic medication.  He insisted that narcotics do not have anything to do with breathing inspite of me trying to reeducate him on the matter.  By the conclusion of the conversation, I informed patient that I do not prescribe narcotics to patients with recurrent respiratory failure and that he is more than welcomed to sign out AMA if needs be and that I am more than happy to prescribe any anti-inflammatory that he would like and that works for him.  He informed me that he will be speaking to my "supervisor" tomorrow which I informed him he is more than welcomed to do.  Of note, patient is not tachycardic, not hypertensive, and not on beta blockers but PRN that was not given for over 12 hours, there are no real indications of pain in this clinical settings.  eICU Interventions  If patient is agreeable to any anti-inflammatory of his choice please call back.     Intervention Category Major Interventions: Other:  Brihanna Devenport 08/15/2015, 12:42 AM

## 2015-08-15 NOTE — Progress Notes (Signed)
PULMONARY / CRITICAL CARE MEDICINE   Name: CLEVIE DO MRN: [id number] DOB: Nov 06, 1945    ADMISSION DATE:  08/12/2015  CHIEF COMPLAINT:  Chest pain   HISTORY OF PRESENT ILLNESS:    70 yo male with MMP including HFrEF (EF30%), Afib, COPD, HTN, CKD who presents from [facility] Chest pain which was persistent from time of recent D/C from [facility] hospital on 3/22 when he was admitted for Afib with RVR and HCAP. He completed a course of ABX and prednisone for COPD exacerbation.  SUBJECTIVE:off vent cardizem drip   VITAL SIGNS: Temp:  [98.2 F (36.8 C)-98.8 F (37.1 C)] 98.8 F (37.1 C) (03/28 0834) Pulse Rate:  [56-109] 96 (03/28 0530) Resp:  [15-23] 19 (03/28 0530) BP: (95-140)/(64-102) 113/85 mmHg (03/28 0530) SpO2:  [96 %-100 %] 100 % (03/28 0808) Weight:  [83 kg (182 lb 15.7 oz)] 83 kg (182 lb 15.7 oz) (03/28 0500) HEMODYNAMICS:   VENTILATOR SETTINGS:   INTAKE / OUTPUT:  Intake/Output Summary (Last 24 hours) at 08/15/15 0905 Last data filed at 08/15/15 0800  Gross per 24 hour  Intake 1015.47 ml  Output    850 ml  Net 165.47 ml    PHYSICAL EXAMINATION: General:  Awake no distress Integument:  Warm & dry. No rash on exposed skin.  HEENT:  No icterus Cardiovascular: s1 s2 irr not tachy now Pulmonary:  Cta Abdomen: Soft. Normal bowel sounds. Nondistended.  Neurological: Following commands. Moving all 4 extremities. Grossly nonfocal.  LABS:  CBC  Recent Labs Lab 08/13/15 0330 08/14/15 0420 08/15/15 0522  WBC 14.7* 10.0 18.2*  HGB 12.5* 12.1* 11.3*  HCT 39.7 38.1* 35.9*  PLT 158 146* 131*   Coag's  Recent Labs Lab 08/13/15 0330 08/14/15 0420 08/15/15 0524  APTT 76* 66* 46*   BMET  Recent Labs Lab 08/13/15 1719 08/14/15 0420 08/15/15 0523  NA 149* 148* 145  K 5.7* 4.9 4.1  CL 118* 117* 117*  CO2 20* 20* 20*  BUN 62* 62* 70*  CREATININE 2.55* 2.42* 2.43*  GLUCOSE 161* 146* 168*   Electrolytes  Recent Labs Lab 08/13/15 0330 08/13/15 1719  08/14/15 0420 08/15/15 0523  CALCIUM 8.6* 9.1 8.9 8.6*  MG 1.6*  --  2.1 2.0  PHOS 4.4  --  4.7* 4.5   Sepsis Markers  Recent Labs Lab 08/12/15 0735 08/13/15 1017 08/13/15 1029 08/13/15 1715 08/14/15 0420 08/15/15 0523  LATICACIDVEN 2.02* 1.7  --  2.4*  --   --   PROCALCITON  --   --  <0.10  --  <0.10 <0.10   ABG  Recent Labs Lab 08/12/15 0637  PHART 7.263*  PCO2ART 50.1*  PO2ART 405.0*   Liver Enzymes  Recent Labs Lab 08/12/15 0427 08/14/15 0420 08/15/15 0523  AST 41 24  --   ALT 91* 68*  --   ALKPHOS 70 66  --   BILITOT 0.5 0.8  --   ALBUMIN 2.8* 2.7*  2.7* 2.5*   Cardiac Enzymes  Recent Labs Lab 08/12/15 0847 08/12/15 1330 08/12/15 1813  TROPONINI 0.09* 0.08* 0.07*   Glucose  Recent Labs Lab 08/12/15 0511 08/12/15 0727  GLUCAP 104* 83    Imaging No results found.  EVENTS: 3/25 - Admit 3/27- extubated  STUDIES: Port CXR 3/27:  Endotracheal tube in good position. Questionable left lower lobe opacity with silhouetting of the diaphragm. Echo 3/27 echo>>>30% diffuse  MICROBIOLOGY: Tracheal Asp Ctx 3/26>>> Respiratory Viral Panel PCR 3/26>>> Urine Ctx 3/25>>> Blood Ctx x2 3/25>>> Urine Legionella Ag  3/25>>> Urine Strep Ag 3/25:  Negative   Sputum Ctx 3/11:  MRSA Influenza PCR 3/10:  Negative  ANTIBIOTICS: Vancomycin 3/25>>3/28 Cefepime 3/25>>>  LINES/TUBES: OETT 8.0 3/25>>>3/27 FOLEY 3/25>>>out OGT 3/25>>> R IJ CVL 3/25>>> PIV x1  ASSESSMENT / PLAN:  PULMONARY A: Acute Hypercarbic Respiratory Failure HCAP/LLL opacity - effusion? COPD with acute exacerbation (FEV1 71% in 2012) Cardiogenic pulmonary edema - From RVR resolved  P:   Solu-medrol 40mg  IV q12hr - taper to pred Xopenex & Atrovent nebs tid - change to prn soon IS Neg balance as able With rapid resolution, likely cardiac rvr rel;ated resp failuire  INFECTIOUS A:   Sepsis - Likely HCAP. H/O MRSA in Sputum 3/11. PCT neg x 3 P:   Vancomycin consider dc  as rapid resolution off vent , not c/w MRSA PNA Cefepime consider dc at day 5   CARDIOVASCULAR A:  Atrial Fibrillation w/ RVR - Now controlled. On Digoxin & Eliquis  Elevated Troponin I - Likely demand ischemia Shock - Peri-intubation. Resolved. 09/2014 EF 30% P:  Diltiazem gtt for rate control, oral load increase BB and get off drip Heparin gtt per pharmacy protocol Metoprolol bid ASA qday Add dig IV load Add home eliquis but recognize we are at threshold near crt to NOT use and change to coumadin  RENAL A:   CKD - Scr at baseline Hypernatremia - improved Lactic Acidosis - Mild  P:   Encourage free water Chem in am  Avoid saline  GASTROINTESTINAL A:   Transaminitis - Mild. H/O GERD & Gastritis  P:   Protonix IV daily- to oral as takes at home Advance diet  HEMATOLOGIC A:   Leukocytosis - Stable. Chronic Anticoagulation - Eliquis.  P:  Cbc in am  Heparin gtt per pharmacy protocol - to eliquis but monitor crt closely  ENDOCRINE A:   No acute issues.  P:   Monitor BG on daily labs  NEUROLOGIC A:   Acute Encephalopathy - Likely secondary to hypercarbia vs toxic metabolic H/O Depression & Bipolar D/O pain P:    Wellbutrin bid Remeron qhs Add home oxy, resp status is good  Ccm time 30 min   Lavon Paganini. Titus Mould, MD, [place] Pgr: [facility]

## 2015-08-15 NOTE — Progress Notes (Signed)
ANTICOAGULATION CONSULT NOTE - Follow Up Consult  Pharmacy Consult for heparin Indication: atrial fibrillation   Labs:  Recent Labs  08/12/15 0847  08/12/15 1330 08/12/15 1813  08/13/15 0330 08/13/15 1719 08/14/15 0420 08/15/15 0522 08/15/15 0523 08/15/15 0524  HGB  --   --   --   --   < > 12.5*  --  12.1* 11.3*  --   --   HCT  --   --   --   --   --  39.7  --  38.1* 35.9*  --   --   PLT  --   --   --   --   --  158  --  146* 131*  --   --   APTT  --   < >  --  67*  --  76*  --  66*  --   --  46*  HEPARINUNFRC  --   < >  --   --   --  1.10*  --  1.04*  --   --  0.80*  CREATININE  --   --   --   --   < > 2.06* 2.55* 2.42*  --  2.43*  --   TROPONINI 0.09*  --  0.08* 0.07*  --   --   --   --   --   --   --   < > = values in this interval not displayed.   Assessment: 70yo male now subtherapeutic on heparin after three PTTs at goal though had been trending down.  Goal of Therapy:  aPTT 66-102 seconds   Plan:  Will increase heparin gtt by 2-3 units/kg/hr to 1200 units/hr and check level in [place], PharmD, BCPS  08/15/2015,6:16 AM

## 2015-08-15 NOTE — Progress Notes (Signed)
Called and gave report to Amy, Therapist, sports. Patient transported to 2MW with RN. Amy, RN at bedside upon arrival.

## 2015-08-15 NOTE — Progress Notes (Signed)
Nutrition Follow-up  DOCUMENTATION CODES:   Non-severe (moderate) malnutrition in context of chronic illness  INTERVENTION:  -Provide Boost Breeze TID, 250 kcal, 9 grams of protein per serving -Provide daily snacks   NUTRITION DIAGNOSIS:   Increased nutrient needs related to chronic illness as evidenced by estimated needs.  GOAL:   Patient will meet greater than or equal to 90% of their needs  -Progressing  MONITOR:   PO intake, Supplement acceptance, Labs, Weight trends   ASSESSMENT:   70 yo male with MMP including HFrEF (EF30%), Afib, COPD, HTN, CKD who presents from [facility] Chest pain which was persistent from time of recent D/C from [facility] hospital on 3/22 when he was admitted for Afib with RVR and HCAP. He completed a course of ABX and prednisone for COPD exacerbation  Pt advanced to heart healthy diet 3/27. Per chart pt is consuming 60% of meals. Pt reports he is tolerating heart healthy diet, states his appetite is good. Pt states he requested Boost Breeze from RN, will order Boost Breeze TID. Pt request a bedtime snack of a sandwich, will provide.   Pt discussed during interdisciplinary rounds.  Medications reviewed, cholecalciferol (vitamin D) 1 tablet daily  Labs reviewed.   Diet Order:  Diet Heart Room service appropriate?: Yes; Fluid consistency:: Thin  Skin:  Wound (see comment) (Stage II Pressure Injury on buttocks)  Last BM:  PTA  Height:   Ht Readings from Last 1 Encounters:  08/12/15 6\' 3"  (1.905 m)    Weight:   Wt Readings from Last 1 Encounters:  08/15/15 182 lb 15.7 oz (83 kg)    Ideal Body Weight:  89.1 kg  BMI:  Body mass index is 22.87 kg/(m^2).  Estimated Nutritional Needs:   Kcal:  1900-2100 (25 kcal/kg)  Protein:  100-115 grams ( 1.2- 1.4 g/kg)  Fluid:  1.9-2.1 L   EDUCATION NEEDS:   No education needs identified at this time  [place], Dietetic Intern Pager: [phone]

## 2015-08-15 NOTE — Care Management Note (Signed)
Case Management Note  Patient Details  Name: [name] MRN: [id number] Date of Birth: Nov 23, 1945  Subjective/Objective:     Pt admitted with Sepsis/PNA               Action/Plan: Discharge Planning: Pt recently discharged to [facility], plan if for pt to return to a SNF facility.  CM met with pt and pt informed CM that he doesn't want to return to facility, requesting additional SNF options.  CSW consulted and informed of concern/request.  CM will continue to follow for disposition needs     Expected Discharge Date:                  Expected Discharge Plan:  Skilled Nursing Facility  In-House Referral:  Clinical Social Work  Discharge planning Services  CM Consult  Post Acute Care Choice:    Choice offered to:     DME Arranged:    DME Agency:     HH Arranged:    [place] Agency:     Status of Service:  In process, will continue to follow  Medicare Important Message Given:    Date Medicare IM Given:    Medicare IM give by:    Date Additional Medicare IM Given:    Additional Medicare Important Message give by:     If discussed at [place] of Stay Meetings, dates discussed:    Additional Comments:  Maryclare Labrador, RN 08/15/2015, 11:50 AM Case Management Note  Patient Details  Name: [name] MRN: [id number] Date of Birth: 01/13/1946  Subjective/Objective:                    Action/Plan:   Expected Discharge Date:                  Expected Discharge Plan:  [place]  In-House Referral:  Clinical Social Work  Discharge planning Services  CM Consult  Post Acute Care Choice:    Choice offered to:     DME Arranged:    DME Agency:     HH Arranged:    [place] Agency:     Status of Service:  In process, will continue to follow  Medicare Important Message Given:    Date Medicare IM Given:    Medicare IM give by:    Date Additional Medicare IM Given:    Additional Medicare Important Message give by:     If discussed at  [place] of Stay Meetings, dates discussed:    Additional Comments:  Maryclare Labrador, RN 08/15/2015, 11:49 AM

## 2015-08-16 ENCOUNTER — Encounter: Payer: Commercial Managed Care - HMO | Admitting: Internal Medicine

## 2015-08-16 DIAGNOSIS — Z950 Presence of cardiac pacemaker: Secondary | ICD-10-CM

## 2015-08-16 DIAGNOSIS — G8929 Other chronic pain: Secondary | ICD-10-CM

## 2015-08-16 DIAGNOSIS — I480 Paroxysmal atrial fibrillation: Secondary | ICD-10-CM

## 2015-08-16 DIAGNOSIS — N183 Chronic kidney disease, stage 3 (moderate): Secondary | ICD-10-CM

## 2015-08-16 DIAGNOSIS — Z7901 Long term (current) use of anticoagulants: Secondary | ICD-10-CM

## 2015-08-16 DIAGNOSIS — N179 Acute kidney failure, unspecified: Secondary | ICD-10-CM

## 2015-08-16 DIAGNOSIS — J441 Chronic obstructive pulmonary disease with (acute) exacerbation: Secondary | ICD-10-CM | POA: Insufficient documentation

## 2015-08-16 DIAGNOSIS — J9601 Acute respiratory failure with hypoxia: Secondary | ICD-10-CM

## 2015-08-16 DIAGNOSIS — D72829 Elevated white blood cell count, unspecified: Secondary | ICD-10-CM

## 2015-08-16 DIAGNOSIS — I5022 Chronic systolic (congestive) heart failure: Secondary | ICD-10-CM

## 2015-08-16 LAB — MAGNESIUM: MAGNESIUM: 1.7 mg/dL (ref 1.7–2.4)

## 2015-08-16 LAB — CBC WITH DIFFERENTIAL/PLATELET
BASOS PCT: 0 %
Basophils Absolute: 0 10*3/uL (ref 0.0–0.1)
EOS ABS: 0 10*3/uL (ref 0.0–0.7)
Eosinophils Relative: 0 %
HCT: 36.4 % — ABNORMAL LOW (ref 39.0–52.0)
HEMOGLOBIN: 11.8 g/dL — AB (ref 13.0–17.0)
Lymphocytes Relative: 4 %
Lymphs Abs: 0.8 10*3/uL (ref 0.7–4.0)
MCH: 30.5 pg (ref 26.0–34.0)
MCHC: 32.4 g/dL (ref 30.0–36.0)
MCV: 94.1 fL (ref 78.0–100.0)
MONOS PCT: 7 %
Monocytes Absolute: 1.3 10*3/uL — ABNORMAL HIGH (ref 0.1–1.0)
NEUTROS PCT: 89 %
Neutro Abs: 17 10*3/uL — ABNORMAL HIGH (ref 1.7–7.7)
Platelets: 126 10*3/uL — ABNORMAL LOW (ref 150–400)
RBC: 3.87 MIL/uL — ABNORMAL LOW (ref 4.22–5.81)
RDW: 16.3 % — ABNORMAL HIGH (ref 11.5–15.5)
WBC: 19.1 10*3/uL — AB (ref 4.0–10.5)

## 2015-08-16 LAB — BASIC METABOLIC PANEL
Anion gap: 8 (ref 5–15)
BUN: 62 mg/dL — AB (ref 6–20)
CO2: 19 mmol/L — ABNORMAL LOW (ref 22–32)
CREATININE: 2.08 mg/dL — AB (ref 0.61–1.24)
Calcium: 8.2 mg/dL — ABNORMAL LOW (ref 8.9–10.3)
Chloride: 120 mmol/L — ABNORMAL HIGH (ref 101–111)
GFR calc Af Amer: 35 mL/min — ABNORMAL LOW (ref >60)
GFR, EST NON AFRICAN AMERICAN: 30 mL/min — AB (ref >60)
Glucose, Bld: 137 mg/dL — ABNORMAL HIGH (ref 65–99)
POTASSIUM: 4.3 mmol/L (ref 3.5–5.1)
SODIUM: 147 mmol/L — AB (ref 135–145)

## 2015-08-16 MED ORDER — METOPROLOL TARTRATE 100 MG PO TABS: 100.0000 mg | ORAL_TABLET | Freq: Every evening | ORAL | Status: DC

## 2015-08-16 MED ORDER — METOPROLOL TARTRATE 100 MG PO TABS
200.0000 mg | ORAL_TABLET | Freq: Every day | ORAL | Status: DC
  Administered 2015-08-17 – 2015-08-18 (×2): 200 mg via ORAL
  Filled 2015-08-16 (×2): qty 2

## 2015-08-16 MED ORDER — DIGOXIN 125 MCG PO TABS
0.0625 mg | ORAL_TABLET | Freq: Every day | ORAL | Status: DC
  Administered 2015-08-17 – 2015-08-18 (×2): 0.0625 mg via ORAL
  Filled 2015-08-16 (×2): qty 1

## 2015-08-16 MED ORDER — METOPROLOL TARTRATE 100 MG PO TABS: 200.0000 mg | ORAL_TABLET | Freq: Every day | ORAL | Status: DC

## 2015-08-16 MED ORDER — METOPROLOL TARTRATE 100 MG PO TABS
100.0000 mg | ORAL_TABLET | Freq: Every day | ORAL | Status: DC
  Administered 2015-08-16 – 2015-08-17 (×2): 100 mg via ORAL
  Filled 2015-08-16 (×2): qty 1

## 2015-08-16 MED ORDER — DILTIAZEM HCL 30 MG PO TABS
30.0000 mg | ORAL_TABLET | Freq: Four times a day (QID) | ORAL | Status: DC
  Administered 2015-08-16 – 2015-08-18 (×9): 30 mg via ORAL
  Filled 2015-08-16 (×9): qty 1

## 2015-08-16 NOTE — Progress Notes (Signed)
Brief History: [name] is a 70 year old male with PMH of HFrEF (30% TTE 08/14/15), Atrial Fibrillation on Eliquis with tachy/brady syndrome (failed amiodarone, Tikosyn, Multaq in the past), CKD, COPD, HTN, and Chronic pain who was admitted to [facility] from 3/10-3/21 with Afib RVR secondary to CAP and COPD exacerbation. He required Diltiazem drip for several days and transitioned to oral Metoprolol. Home Digoxin was continued and home oral Diltiazem was discontinued per Cardiology recommendations. Eliquis dose was reduced due to renal disease. Pacemaker was checked on 08/09/2015 and noted to have normal function. He was treated for CAP with 5 days of Azithromycin. Sputum culture grew few MRSA, likely from nasopharyngeal colonization. COPD was treated with 10 days steroids and scheduled breathing treatments. He developed an ileus during that admission which required NG tube decompression with output of over 2L initially. Ileus resolved quickly. He had hyperkalemia as well which required kayexalate several times. Patient was discharged to [facility]. At that time, patient was without chest pain, palpitations, abdominal pain, wheezing, or worsened shortness of breath from his baseline. He was tolerating oral intake and with good bowel movements. Physical exam revealed clear lungs and continued atrial fibrillation with rates from 90-110s, no peripheral edema.  The day after discharge (08/09/15), he was seen by Dr. Rayann Heman in clinic. Per EPIC note, he denied chest pain, palpitations, edema, syncope, but did have shortness of breath. His Metoprolol was increased to 200 mg qam and 100 mg pm. Digoxin was discontinued due to renal disease.  Patient returned to [facility] ED on 08/12/15 with SOB and CP. He was thought to have worsening pneumonia. Code sepsis was called and treatment for HCAP was begun with IV Vanc/Cefepime. He was given 3.5 L NS in the ED. Afterwards he had worsened hypoxia and altered mental status. Patient  was intubated for respiratory failure. PCCM was consulted for ICU admission. Diltiazem gtt, steroids, and antibiotics were continued. Digoxin was added back. Patient extubated on 08/14/15. Cultures were negative and procalcitonins were also negative. Vancomycin was discontinued after 4 days, Cefepime continued to complete 5 day course (3/25-3/29). He was stable to transfer to IMTS on 3/29.  Subjective: Patient seen in MICU during rounds. Patient initially states that he was discharged too early last visit. He then states that he did feel better when he left the hospital, but is upset that he did not see a doctor at [facility] for several days. He reports feeling better today, but complains that the room is cold. He also says he has some wheezing since yesterday. Patient says he thinks he needs to stay in the hospital for at least 2 more days. He does not want to return to [facility]. He also says he does not want to move to the 6th floor as he had a bad experience about 6 months ago. Patient initially upset when seen today, but has calmed down and apologized.    Objective: Vital signs in last 24 hours: Filed Vitals:   08/16/15 0900 08/16/15 0932 08/16/15 1000 08/16/15 1050  BP: 109/67  109/85 111/80  Pulse: 70  54 59  Temp:    98.2 F (36.8 C)  TempSrc:    Oral  Resp: 20  23 19   Height:      Weight:      SpO2: 97% 97% 100% 100%   Weight change: 14.1 oz (0.4 kg)  Intake/Output Summary (Last 24 hours) at 08/16/15 1150 Last data filed at 08/16/15 0800  Gross per 24 hour  Intake  2750 ml  Output   1495 ml  Net   1255 ml   General: resting in bed, no acute distress Cardiac: irregularly irregular, normal rate Pulm: clear to auscultation bilaterally, moving normal volumes of air, no wheezing appreciated Abd: soft, nontender, nondistended Ext: warm and well perfused, no pedal edema Neuro: alert and oriented X3   Assessment/Plan: Active Problems:   Sepsis ([place])   Malnutrition of moderate  degree   Pressure ulcer   Acute respiratory failure (HCC)   Central line clotted   Encounter for central line placement  A. Fib with RVR: CHADSVASc of 6 with 9.8% yearly risk of stroke. Currently rate-controlled off diltiazem drip.  -Eliquis 2.5 mg bid renal dose -Increase Metoprolol to 200 mg am and 100 mg pm -Decrease Digoxin to 0.0625 po mg qd given renal disease -Decrease Diltiazem to 30 mg po q6h given low EF -Telemetry  COPD Exacerbation: No appreciable wheezing on lung exam today.  -Xopenex TID -Atrovent TID -Prednisone 40 mg po daily -Monitor respiratory status  Acute on CKD: Patient with history of CKD 3 thought secondary to cardiorenal syndrome. SCr 2.08 this morning at baseline. -f/u BMP   Systolic CHF: EF [id number] on 0000000 ECHO.  -Daily weights -Strict I&Os -Metoprolol succinate 200 mg qam, 100 mg qpm  Pneumonia: Unlikely MRSA pneumonia, cultures negative, procalcitonin negative x 3. Off Vancomycin, completed 5 day Cefepime course. -f/u cultures -Off Vancomycin 3/25>>3/28 -Completed Cefepime 3/25>>> 3/29   Chronic Pain: On Percocet 10-325 mg q4hr at home prn for chronic hip and low back pain, bilateral hip replacements, and OA of multiple joints. -Percocet 5-325 mg q4h prn -Oxycodone IR 5 mg q4h prn   Dispo: Disposition is deferred at this time, awaiting improvement of current medical problems.  Anticipated discharge in approximately 2 day(s).     LOS: 4 days   Zada Finders, MD 08/16/2015, 11:50 AM

## 2015-08-16 NOTE — Progress Notes (Signed)
Pt called me into room requesting that I change his diet order to regular; informed pt that I had to page the doctor for that request. Paged Dr. [name] and awaiting further orders.

## 2015-08-16 NOTE — Progress Notes (Signed)
Pharmacist Heart Failure Core Measure Documentation  Assessment: [name] has an EF documented as 30% on 08/08/2015 by 2D echo.  Rationale: Heart failure patients with left ventricular systolic dysfunction (LVSD) and an EF < 40% should be prescribed an angiotensin converting enzyme inhibitor (ACEI) or angiotensin receptor blocker (ARB) at discharge unless a contraindication is documented in the medical record.  This patient is not currently on an ACEI or ARB for HF.  This note is being placed in the record in order to provide documentation that a contraindication to the use of these agents is present for this encounter.  ACE Inhibitor or Angiotensin Receptor Blocker is contraindicated (specify all that apply)  []   ACEI allergy AND ARB allergy []   Angioedema []   Moderate or severe aortic stenosis []   Hyperkalemia []   Hypotension []   Renal artery stenosis [x]   Worsening renal function, preexisting renal disease or dysfunction   [name] 08/16/2015 3:43 PM

## 2015-08-16 NOTE — Clinical Social Work Note (Signed)
CSW talked with patient and his fiancee, Tamala Ser, at the bedside regarding discharge planning. Patient assessed and he does not want to return to [place] provided patient with SNF list and his preferences are [place] and [facility]. Patient advised that he will be provided with facility responses.  Kellye Mizner Givens, MSW, LCSW Licensed Clinical Social Worker [place] [phone]

## 2015-08-16 NOTE — Progress Notes (Signed)
PT Cancellation Note  Patient Details Name: [name] MRN: [id number] DOB: 01-04-1946   Cancelled Treatment:    Reason Eval/Treat Not Completed: Patient declined, no reason specified Pt declined PT this AM wanting PT to come back after lunch at 2:15 pm. This therapist came back at requested time and pt continues to refuse. If pt continues to refuse therapy, PT will not be coming back. Informed pt of continued refusals.   Marguarite Arbour A Jill Ruppe 08/16/2015, 2:25 PM Wray Kearns, Bonnie, DPT [phone]
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
## 2015-08-17 ENCOUNTER — Other Ambulatory Visit: Payer: Self-pay | Admitting: *Deleted

## 2015-08-17 LAB — CBC WITH DIFFERENTIAL/PLATELET
BASOS ABS: 0 10*3/uL (ref 0.0–0.1)
Basophils Relative: 0 %
EOS PCT: 0 %
Eosinophils Absolute: 0 10*3/uL (ref 0.0–0.7)
HEMATOCRIT: 40.3 % (ref 39.0–52.0)
HEMOGLOBIN: 13.1 g/dL (ref 13.0–17.0)
LYMPHS ABS: 1.1 10*3/uL (ref 0.7–4.0)
LYMPHS PCT: 6 %
MCH: 30.5 pg (ref 26.0–34.0)
MCHC: 32.5 g/dL (ref 30.0–36.0)
MCV: 93.7 fL (ref 78.0–100.0)
Monocytes Absolute: 1.7 10*3/uL — ABNORMAL HIGH (ref 0.1–1.0)
Monocytes Relative: 9 %
NEUTROS ABS: 15.5 10*3/uL — AB (ref 1.7–7.7)
NEUTROS PCT: 85 %
PLATELETS: 120 10*3/uL — AB (ref 150–400)
RBC: 4.3 MIL/uL (ref 4.22–5.81)
RDW: 16.4 % — ABNORMAL HIGH (ref 11.5–15.5)
WBC: 18.3 10*3/uL — ABNORMAL HIGH (ref 4.0–10.5)

## 2015-08-17 LAB — BASIC METABOLIC PANEL
ANION GAP: 7 (ref 5–15)
BUN: 57 mg/dL — ABNORMAL HIGH (ref 6–20)
CALCIUM: 8.6 mg/dL — AB (ref 8.9–10.3)
CO2: 19 mmol/L — ABNORMAL LOW (ref 22–32)
Chloride: 120 mmol/L — ABNORMAL HIGH (ref 101–111)
Creatinine, Ser: 1.95 mg/dL — ABNORMAL HIGH (ref 0.61–1.24)
GFR, EST AFRICAN AMERICAN: 38 mL/min — AB (ref >60)
GFR, EST NON AFRICAN AMERICAN: 33 mL/min — AB (ref >60)
Glucose, Bld: 103 mg/dL — ABNORMAL HIGH (ref 65–99)
Potassium: 4.8 mmol/L (ref 3.5–5.1)
SODIUM: 146 mmol/L — AB (ref 135–145)

## 2015-08-17 LAB — CULTURE, BLOOD (ROUTINE X 2)
Culture: NO GROWTH
Culture: NO GROWTH

## 2015-08-17 LAB — MAGNESIUM: Magnesium: 1.8 mg/dL (ref 1.7–2.4)

## 2015-08-17 MED ORDER — PREDNISONE 20 MG PO TABS
20.0000 mg | ORAL_TABLET | Freq: Every day | ORAL | Status: AC
  Administered 2015-08-18: 20 mg via ORAL
  Filled 2015-08-17: qty 1

## 2015-08-17 MED ORDER — PREDNISONE 10 MG PO TABS: 10.0000 mg | ORAL_TABLET | Freq: Once | ORAL | Status: DC

## 2015-08-17 NOTE — Progress Notes (Signed)
PT Cancellation Note  Patient Details Name: [name] MRN: [id number] DOB: Jan 06, 1946   Cancelled Treatment:    Reason Eval/Treat Not Completed: Patient declined, no reason specified Third refusal in a row. Pt states he will work with PT after lunch however refused again after promising this yesterday. We will attempt one final time to evaluate patient however, if he refuses he will be discharged from PT services.  [name] 08/17/2015, 11:29 AM Elayne Snare, [place]

## 2015-08-17 NOTE — Patient Outreach (Signed)
[facility]) Care Management  08/17/2015  [name] May 16, 1946 [id number]  CSW received a new referral on patient from [facility] Liaison with [facility], indicating that patient would benefit from social work services and resources to assist with possible placement arrangements into a skilled nursing facility for long-term care services.  After thorough review of patient's EMR (Electronic Medical Record) in [facility], CSW noted that patient was discharged from [name] on March 21st, placed at [facility], but then readmitted into the hospital on March 25th.  Patient reports that he does not wish to return to [facility], requesting additional placement options.  Crawford Givens, Inpatient [facility] CSW is currently assisting patient with placement arrangements, providing the following bed offers: [facility] Patient is discussing options with family and will provide Mrs. Crawford with his preference, prior to being discharged from the hospital.  CSW will continue to follow along to assess and assist, as needed.  [name], BSW, MSW, LCSW  Licensed Education officer, environmental Health System  Mailing [place] [address] [place], [place] [address] Physical Address-[address], [place], [place] [address] Toll Free Main # [phone] Fax # [phone] Cell # [phone]  Fax # [phone]  [name]  [employer]  Discrimination is Against the [employer]. and its subsidiaries comply with applicable Federal civil rights laws and do not discriminate on the basis of race, color, national origin, age, disability, or sex. [place] do not exclude people or treat them differently because of race, color, national origin, age, disability, or sex.    [employer]. and its  subsidiaries provide:  . Free auxiliary aids and services, such as qualified sign language interpreters, video remote interpretation, and written information in other formats to people with disabilities when such auxiliary aids and services are necessary to ensure an equal opportunity to participate. . Free language services to people whose primary language is not English when those services are necessary to provide meaningful access, such as translated documents or oral interpretation.    If you need these services, call [phone] or if you use a TTY, call 711.   If you believe that [employer]. and its subsidiaries have failed to provide these services or discriminated in another way on the basis of race, color, national origin, age, disability, or sex, you can file a Tourist information centre manager with:   Discrimination [place] [address]   If you need help filing a grievance, call [phone] or if you use a TTY, call 711.  You can also file a civil rights complaint with the U.S. Department of Health and Financial controller, Office for Civil Rights electronically through the Office for Civil Rights Complaint Portal, available at OnSiteLending.nl.jsf, or by mail or phone at:   [place]. Department of Health and Human Services  [place], [place], [facility]  [place], [place]  [phone], [phone] (TDD)  Complaint forms are available at CutFunds.si             [place]: ATTENTION: If you do not speak English, language assistance services, free of charge, are available to you. Call [phone]  (TTY: [id number]).  Espaol (Spanish): ATENCIN: si habla espaol, tiene a su disposicin servicios gratuitos de asistencia lingstica. Llame al [phone] (TTY: [id number]).  ???? (Chinese): ?????????????????????????????? [phone]  (TTY:711??  Ti?ng  Vi?t ([place]): CH : N?u b?n ni Ti?ng Vi?t, c cc d?ch v? h? tr? ngn ng? mi?n ph dnh cho b?n. G?i s? [phone] (TTY: [id number]).  ??? ([place]): ?? : ???? ????? ?? , ?? ?? ???? ??? ???? ? ???? . [phone] (TTY: 711)??? ??? ???? .  Tagalog (Tagalog - Filipino): PAUNAWA: Kung nagsasalita ka ng Tagalog, maaari kang gumamit ng mga serbisyo ng tulong sa wika nang walang bayad. [place] sa [phone] (TTY: [id number]).   [place]): :      ,      .  [phone] (: [id number]).  Kreyl Ayisyen ([place]): ATANSYON: Si w pale Ethelene Hal, gen svis d pou lang ki disponib gratis pou ou. Rele [phone] (TTY: [id number]).  Fonnie Jarvis Marland KitchenPak[place]): ATTENTION : Si vous parlez franais, des services d'aide linguistique vous sont proposs gratuitement. Appelez le [phone] (ATS : [id number]).  Polski (Polish): UWAGA: Jeeli mwisz po polsku, moesz skorzysta z bezpatnej pomocy jzykowej. Zadzwo pod numer [phone] (TTY: [id number]).  Portugus ([place]): ATENO: Se fala portugus, encontram-se disponveis servios lingusticos, grtis. Ligue para [phone] (TTY: [id number]).   Italiano ([place]): ATTENZIONE: In caso la lingua parlata sia l'italiano, sono disponibili servizi di assistenza linguistica gratuiti. Chiamare il numero [phone] (TTY: [id number]).  Dawayne Patricia ([place]): ACHTUNG: Wenn Sie Deutsch sprechen, stehen Ihnen kostenlos sprachliche Hilfsdienstleistungen zur [employer]. [place]: [phone] (TTY: [id number]).   (Arabic): [phone]   .            : .)[id number] :   (  ??? ([place]): ??????????????????????????????????[phone] ?TTY?711?????????????????  ? (Farsi): [phone]  . ?   ? ?  ? ? ?~ ?  ?     : .??  (TTY: 711)  Din Bizaad ([place]): D77 baa ak0 n7n7zin: D77 saad bee y1n7[ti'go Risa Grill, saad bee 1k1'1n7da'1wo'd66', t'11 Pricilla Loveless n1 h0l=, koj8' h0d77lnih [phone] (TTY: [id number]).

## 2015-08-17 NOTE — Progress Notes (Signed)
Patient ID: [name], male   DOB: [birth year], 70 y.o.   MRN: [id number] Medicine attending: I examined this patient this morning together with resident physician [name] and I concur with his evaluation and management plan which we discussed together. The patient is in a much better frame of mind today. Examination is stable. Lungs are currently clear with no wheezes or rhonchi. Heart rate variable with peak rates as high as 127 with average rate around 120. Blood pressure stable. He has failed or had toxic effects with amiodarone, Tikosyn, and multaq. There is a limit to how much we can increase his beta blockade in view of his underlying obstructive airway disease. Despite a relative contraindication to using a calcium channel blocker in somebody with a depressed ejection fraction, we seem to be able to get much better control of his heart rate with the combination of the calcium channel blocker and the beta blocker. I personally feel we should continue both agents in addition to renally dosed digoxin. Patient is agreeable to short-term skilled nursing facility but wants to have a choice of which facility he goes to. We will initiate discharge planning.

## 2015-08-17 NOTE — Progress Notes (Signed)
   Subjective: Patient in pleasant mood this morning. He says his breathing is well after he just received a breathing treatment. He denies any chest pain or palpitations. He ate well last night. He reports a small bowel movement yesterday after receiving an enema. He says he wants to work with PT, but was not feeling well and was upset yesterday. He will try today. He says he wants to go to either [place] or [facility] on discharge. If these are not options, he would prefer to go home.   Objective: Vital signs in last 24 hours: Filed Vitals:   08/17/15 0500 08/17/15 0525 08/17/15 0726 08/17/15 0821  BP:  107/81  131/88  Pulse:  99  91  Temp:  98.7 F (37.1 C)  98.3 F (36.8 C)  TempSrc:  Oral  Oral  Resp:  18  16  Height:      Weight: 181 lb 8 oz (82.328 kg)     SpO2:  99% 98% 99%   Weight change: -2 lb 5.8 oz (-1.072 kg)  Intake/Output Summary (Last 24 hours) at 08/17/15 1134 Last data filed at 08/17/15 1044  Gross per 24 hour  Intake    240 ml  Output    375 ml  Net   -135 ml   General: resting in bed, no acute distress Cardiac: irregularly irregular, normal rate Pulm: clear to auscultation bilaterally, moving normal volumes of air, no wheezing or rales appreciated Abd: soft, nontender, nondistended Ext: warm and well perfused, no pedal edema Neuro: alert and oriented X3   Assessment/Plan: Active Problems:   Sepsis (HCC)   Malnutrition of moderate degree   Pressure ulcer   Acute respiratory failure (HCC)   Central line clotted   Encounter for central line placement   Persistent atrial fibrillation (HCC)   Nonischemic congestive cardiomyopathy (HCC)   COPD exacerbation (HCC)  A. Fib with RVR: CHADSVASc of 6 with 9.8% yearly risk of stroke. Currently rate-controlled off diltiazem drip.  -Eliquis 2.5 mg bid renal dose -Continue Metoprolol to 200 mg am and 100 mg pm -Continue Digoxin to 0.0625 po mg qd  -Continue Diltiazem to 30 mg po q6h   -Telemetry  COPD Exacerbation: No appreciable wheezing on lung exam today.  -Xopenex TID -Atrovent TID -Prednisone 40 mg po today, taper off next 2 days, 20 mg >> 10 mg -Monitor respiratory status  Acute on CKD: Patient with history of CKD 3 thought secondary to cardiorenal syndrome. SCr 1.95 this morning at baseline. -f/u BMP  Systolic CHF: EF [id number] on 0000000 ECHO.  -Daily weights -Strict I&Os -Metoprolol succinate 200 mg qam, 100 mg qpm  Pneumonia: Unlikely MRSA pneumonia, cultures negative, procalcitonin negative x 3. Off Vancomycin, completed 5 day Cefepime course. -f/u cultures -Off Vancomycin 3/25>>3/28 -Completed Cefepime 3/25>>> 3/29   Chronic Pain: On Percocet 10-325 mg q4hr at home prn for chronic hip and low back pain, bilateral hip replacements, and OA of multiple joints. -Percocet 5-325 mg q4h prn -Oxycodone IR 5 mg q4h prn   Dispo:  Anticipated discharge in approximately 1-2 day(s).     LOS: 5 days   Zada Finders, MD 08/17/2015, 11:34 AM

## 2015-08-17 NOTE — Evaluation (Signed)
Physical Therapy Evaluation Patient Details Name: [name] MRN: [id number] DOB: 03/22/1946 Today's Date: 08/17/2015   History of Present Illness  70 y.o. male admitted with sepsis, afib with RVR, COPD Exacerbation, systolic CHF, Pneumonia, and Acute on CKD.  Clinical Impression  Pt admitted with above diagnosis. Pt currently with functional limitations due to the deficits listed below (see PT Problem List). More eager to work with therapy this afternoon. Was incontinent of stool when PT got patient out of bed. Required min assist to min guard to safely mobilize. Ambulates short distance in room, easily fatigued. SpO2 97% with dyspnea.Pt will benefit from skilled PT to increase their independence and safety with mobility to allow discharge to the venue listed below.       Follow Up Recommendations SNF;Supervision for mobility/OOB    Equipment Recommendations  None recommended by PT    Recommendations for Other Services       Precautions / Restrictions Precautions Precautions: Fall Restrictions Weight Bearing Restrictions: No      Mobility  Bed Mobility Overal bed mobility: Needs Assistance Bed Mobility: Supine to Sit     Supine to sit: Supervision;HOB elevated     General bed mobility comments: Required extra time, use of rail and HOB elevated. Supervision for safety.  Transfers Overall transfer level: Needs assistance Equipment used: Rolling walker (2 wheeled) Transfers: Sit to/from [facility] Sit to Stand: Min assist Stand pivot transfers: Min assist       General transfer comment: Min assist for boost to stand, slow to rise from bed. Min assist for balance with pivot to [facility]. Sits prematurely. Cues for hand placement and controlled descent. Min guard to rise from [facility] with cues.  Ambulation/Gait Ambulation/Gait assistance: Min guard Ambulation Distance (Feet): 10 Feet Assistive device: Rolling walker (2 wheeled) Gait Pattern/deviations:  Step-through pattern;Decreased stride length;Trunk flexed;Narrow base of support Gait velocity: decreased   General Gait Details: VC for safety, walker placement, upright posture, and wide base of support. NO buckling noted. Ambulates short distance today, with 3/4 dyspnea, SpO2 97% on room air, very deconditioned. Declined to ambulate further distance, feels fatigued.  Stairs            Wheelchair Mobility    Modified Rankin (Stroke Patients Only)       Balance Overall balance assessment: Needs assistance;History of Falls Sitting-balance support: No upper extremity supported;Feet supported Sitting balance-Leahy Scale: Good     Standing balance support: Single extremity supported Standing balance-Leahy Scale: Poor                               Pertinent Vitals/Pain Pain Assessment: 0-10 Pain Score: 7  Pain Location: buttocks, and hips Pain Descriptors / Indicators: Aching Pain Intervention(s): Monitored during session;Repositioned    Home Living Family/patient expects to be discharged to:: Private residence Living Arrangements: Spouse/significant other Available Help at Discharge: Family;Available PRN/intermittently Type of Home: House Home Access: Level entry Entrance Stairs-Rails: None Entrance Stairs-Number of Steps: 1 Home Layout: One level Home Equipment: Walker - 2 wheels;Cane - single point;Bedside commode      Prior Function Level of Independence: Needs assistance   Gait / Transfers Assistance Needed: Walked short distances with RW  ADL's / Homemaking Assistance Needed: Needed assist with dressing        Hand Dominance   Dominant Hand: Right    Extremity/Trunk Assessment   Upper Extremity Assessment: Defer to OT evaluation  Lower Extremity Assessment: Generalized weakness         Communication   Communication: No difficulties  Cognition Arousal/Alertness: Awake/alert Behavior During Therapy: WFL for tasks  assessed/performed Overall Cognitive Status: Within Functional Limits for tasks assessed                      General Comments General comments (skin integrity, edema, etc.): Bowel incontinence in bed.    Exercises        Assessment/Plan    PT Assessment Patient needs continued PT services  PT Diagnosis Abnormality of gait;Difficulty walking;Generalized weakness   PT Problem List Decreased strength;Decreased activity tolerance;Decreased mobility;Decreased balance;Decreased knowledge of use of DME;Cardiopulmonary status limiting activity;Pain  PT Treatment Interventions DME instruction;Gait training;Functional mobility training;Therapeutic activities;Therapeutic exercise;Balance training;Patient/family education   PT Goals (Current goals can be found in the Care Plan section) Acute Rehab PT Goals Patient Stated Goal: Go to rehab PT Goal Formulation: With patient Time For Goal Achievement: 08/31/15 Potential to Achieve Goals: Fair    Frequency Min 2X/week   Barriers to discharge Decreased caregiver support is alone at home majority of the time    Co-evaluation               End of Session Equipment Utilized During Treatment: Gait belt Activity Tolerance: Patient limited by fatigue Patient left: in chair;with call bell/phone within reach;with chair alarm set Nurse Communication: Mobility status (abrasion on buttocks, bowel incontinence)         Time: [id number] PT Time Calculation (min) (ACUTE ONLY): 22 min   Charges:   PT Evaluation $PT Eval Low Complexity: 1 Procedure     PT G CodesEllouise Newer 08/17/2015, 2:54 PM [name], [name]

## 2015-08-18 DIAGNOSIS — N183 Chronic kidney disease, stage 3 (moderate): Secondary | ICD-10-CM | POA: Diagnosis present

## 2015-08-18 DIAGNOSIS — Z87891 Personal history of nicotine dependence: Secondary | ICD-10-CM | POA: Diagnosis not present

## 2015-08-18 DIAGNOSIS — Z96643 Presence of artificial hip joint, bilateral: Secondary | ICD-10-CM | POA: Diagnosis present

## 2015-08-18 DIAGNOSIS — G8929 Other chronic pain: Secondary | ICD-10-CM | POA: Diagnosis present

## 2015-08-18 DIAGNOSIS — R069 Unspecified abnormalities of breathing: Secondary | ICD-10-CM | POA: Diagnosis not present

## 2015-08-18 DIAGNOSIS — N19 Unspecified kidney failure: Secondary | ICD-10-CM | POA: Diagnosis not present

## 2015-08-18 DIAGNOSIS — R2681 Unsteadiness on feet: Secondary | ICD-10-CM | POA: Diagnosis not present

## 2015-08-18 DIAGNOSIS — J80 Acute respiratory distress syndrome: Secondary | ICD-10-CM | POA: Diagnosis not present

## 2015-08-18 DIAGNOSIS — I130 Hypertensive heart and chronic kidney disease with heart failure and stage 1 through stage 4 chronic kidney disease, or unspecified chronic kidney disease: Secondary | ICD-10-CM | POA: Diagnosis not present

## 2015-08-18 DIAGNOSIS — M6281 Muscle weakness (generalized): Secondary | ICD-10-CM | POA: Diagnosis not present

## 2015-08-18 DIAGNOSIS — J449 Chronic obstructive pulmonary disease, unspecified: Secondary | ICD-10-CM | POA: Diagnosis not present

## 2015-08-18 DIAGNOSIS — Z888 Allergy status to other drugs, medicaments and biological substances status: Secondary | ICD-10-CM | POA: Diagnosis not present

## 2015-08-18 DIAGNOSIS — I481 Persistent atrial fibrillation: Secondary | ICD-10-CM | POA: Diagnosis not present

## 2015-08-18 DIAGNOSIS — J9621 Acute and chronic respiratory failure with hypoxia: Secondary | ICD-10-CM | POA: Diagnosis not present

## 2015-08-18 DIAGNOSIS — D689 Coagulation defect, unspecified: Secondary | ICD-10-CM | POA: Insufficient documentation

## 2015-08-18 DIAGNOSIS — J9601 Acute respiratory failure with hypoxia: Secondary | ICD-10-CM | POA: Diagnosis not present

## 2015-08-18 DIAGNOSIS — F419 Anxiety disorder, unspecified: Secondary | ICD-10-CM | POA: Diagnosis present

## 2015-08-18 DIAGNOSIS — Z823 Family history of stroke: Secondary | ICD-10-CM | POA: Diagnosis not present

## 2015-08-18 DIAGNOSIS — J129 Viral pneumonia, unspecified: Secondary | ICD-10-CM | POA: Diagnosis not present

## 2015-08-18 DIAGNOSIS — I252 Old myocardial infarction: Secondary | ICD-10-CM | POA: Diagnosis not present

## 2015-08-18 DIAGNOSIS — J189 Pneumonia, unspecified organism: Secondary | ICD-10-CM | POA: Diagnosis not present

## 2015-08-18 DIAGNOSIS — I429 Cardiomyopathy, unspecified: Secondary | ICD-10-CM | POA: Diagnosis not present

## 2015-08-18 DIAGNOSIS — E870 Hyperosmolality and hypernatremia: Secondary | ICD-10-CM | POA: Diagnosis not present

## 2015-08-18 DIAGNOSIS — I5023 Acute on chronic systolic (congestive) heart failure: Secondary | ICD-10-CM | POA: Diagnosis not present

## 2015-08-18 DIAGNOSIS — I2510 Atherosclerotic heart disease of native coronary artery without angina pectoris: Secondary | ICD-10-CM | POA: Diagnosis not present

## 2015-08-18 DIAGNOSIS — R0602 Shortness of breath: Secondary | ICD-10-CM | POA: Diagnosis not present

## 2015-08-18 DIAGNOSIS — E785 Hyperlipidemia, unspecified: Secondary | ICD-10-CM | POA: Diagnosis not present

## 2015-08-18 DIAGNOSIS — Z8249 Family history of ischemic heart disease and other diseases of the circulatory system: Secondary | ICD-10-CM | POA: Diagnosis not present

## 2015-08-18 DIAGNOSIS — F418 Other specified anxiety disorders: Secondary | ICD-10-CM | POA: Diagnosis present

## 2015-08-18 DIAGNOSIS — I10 Essential (primary) hypertension: Secondary | ICD-10-CM | POA: Diagnosis not present

## 2015-08-18 DIAGNOSIS — Z86718 Personal history of other venous thrombosis and embolism: Secondary | ICD-10-CM | POA: Diagnosis not present

## 2015-08-18 DIAGNOSIS — Z880 Allergy status to penicillin: Secondary | ICD-10-CM | POA: Diagnosis not present

## 2015-08-18 DIAGNOSIS — J440 Chronic obstructive pulmonary disease with acute lower respiratory infection: Secondary | ICD-10-CM | POA: Diagnosis not present

## 2015-08-18 DIAGNOSIS — K219 Gastro-esophageal reflux disease without esophagitis: Secondary | ICD-10-CM | POA: Diagnosis not present

## 2015-08-18 DIAGNOSIS — R400 Somnolence: Secondary | ICD-10-CM | POA: Diagnosis not present

## 2015-08-18 DIAGNOSIS — Y95 Nosocomial condition: Secondary | ICD-10-CM | POA: Diagnosis present

## 2015-08-18 DIAGNOSIS — Z950 Presence of cardiac pacemaker: Secondary | ICD-10-CM | POA: Diagnosis not present

## 2015-08-18 DIAGNOSIS — E46 Unspecified protein-calorie malnutrition: Secondary | ICD-10-CM | POA: Diagnosis not present

## 2015-08-18 DIAGNOSIS — I4892 Unspecified atrial flutter: Secondary | ICD-10-CM | POA: Diagnosis not present

## 2015-08-18 DIAGNOSIS — F319 Bipolar disorder, unspecified: Secondary | ICD-10-CM | POA: Diagnosis present

## 2015-08-18 DIAGNOSIS — Z79899 Other long term (current) drug therapy: Secondary | ICD-10-CM | POA: Diagnosis not present

## 2015-08-18 DIAGNOSIS — G4700 Insomnia, unspecified: Secondary | ICD-10-CM | POA: Diagnosis not present

## 2015-08-18 DIAGNOSIS — Z7901 Long term (current) use of anticoagulants: Secondary | ICD-10-CM | POA: Diagnosis not present

## 2015-08-18 DIAGNOSIS — F339 Major depressive disorder, recurrent, unspecified: Secondary | ICD-10-CM | POA: Diagnosis not present

## 2015-08-18 DIAGNOSIS — Z77090 Contact with and (suspected) exposure to asbestos: Secondary | ICD-10-CM | POA: Diagnosis present

## 2015-08-18 DIAGNOSIS — E875 Hyperkalemia: Secondary | ICD-10-CM | POA: Diagnosis not present

## 2015-08-18 DIAGNOSIS — I4891 Unspecified atrial fibrillation: Secondary | ICD-10-CM | POA: Diagnosis not present

## 2015-08-18 LAB — BASIC METABOLIC PANEL
ANION GAP: 7 (ref 5–15)
BUN: 53 mg/dL — ABNORMAL HIGH (ref 6–20)
CHLORIDE: 119 mmol/L — AB (ref 101–111)
CO2: 18 mmol/L — AB (ref 22–32)
CREATININE: 1.75 mg/dL — AB (ref 0.61–1.24)
Calcium: 8.5 mg/dL — ABNORMAL LOW (ref 8.9–10.3)
GFR calc non Af Amer: 37 mL/min — ABNORMAL LOW (ref >60)
GFR, EST AFRICAN AMERICAN: 43 mL/min — AB (ref >60)
Glucose, Bld: 104 mg/dL — ABNORMAL HIGH (ref 65–99)
POTASSIUM: 4.7 mmol/L (ref 3.5–5.1)
Sodium: 144 mmol/L (ref 135–145)

## 2015-08-18 LAB — CBC WITH DIFFERENTIAL/PLATELET
BASOS ABS: 0 10*3/uL (ref 0.0–0.1)
Basophils Relative: 0 %
Eosinophils Absolute: 0 10*3/uL (ref 0.0–0.7)
Eosinophils Relative: 0 %
HEMATOCRIT: 39.6 % (ref 39.0–52.0)
HEMOGLOBIN: 13.1 g/dL (ref 13.0–17.0)
LYMPHS PCT: 9 %
Lymphs Abs: 1.5 10*3/uL (ref 0.7–4.0)
MCH: 30.7 pg (ref 26.0–34.0)
MCHC: 33.1 g/dL (ref 30.0–36.0)
MCV: 92.7 fL (ref 78.0–100.0)
MONO ABS: 1.2 10*3/uL — AB (ref 0.1–1.0)
Monocytes Relative: 8 %
NEUTROS ABS: 13.6 10*3/uL — AB (ref 1.7–7.7)
NEUTROS PCT: 83 %
Platelets: 116 10*3/uL — ABNORMAL LOW (ref 150–400)
RBC: 4.27 MIL/uL (ref 4.22–5.81)
RDW: 16.2 % — AB (ref 11.5–15.5)
WBC: 16.3 10*3/uL — ABNORMAL HIGH (ref 4.0–10.5)

## 2015-08-18 LAB — MAGNESIUM: Magnesium: 1.8 mg/dL (ref 1.7–2.4)

## 2015-08-18 MED ORDER — LEVALBUTEROL HCL 0.63 MG/3ML IN NEBU: 0.6300 mg | INHALATION_SOLUTION | Freq: Three times a day (TID) | RESPIRATORY_TRACT | Status: DC

## 2015-08-18 MED ORDER — IPRATROPIUM BROMIDE 0.02 % IN SOLN: 0.5000 mg | Freq: Three times a day (TID) | RESPIRATORY_TRACT | Status: DC

## 2015-08-18 MED ORDER — DIGOXIN 62.5 MCG PO TABS: 0.0625 mg | ORAL_TABLET | Freq: Every day | ORAL | Status: DC

## 2015-08-18 MED ORDER — DILTIAZEM HCL ER COATED BEADS 120 MG PO CP24: 120.0000 mg | ORAL_CAPSULE | Freq: Every day | ORAL | Status: DC

## 2015-08-18 MED ORDER — OXYCODONE-ACETAMINOPHEN 10-325 MG PO TABS: 1.0000 | ORAL_TABLET | ORAL | Status: DC | PRN

## 2015-08-18 MED ORDER — PREDNISONE 10 MG PO TABS: 10.0000 mg | ORAL_TABLET | Freq: Every day | ORAL | Status: AC

## 2015-08-18 NOTE — Progress Notes (Addendum)
   Subjective: Patient feeling well this morning. He denies any SOB, chest pain, or palpitations. He feels comfortable for discharge to either [place] or [facility]. If neither of these are options, he would prefer to go home.  Objective: Vital signs in last 24 hours: Filed Vitals:   08/17/15 2146 08/18/15 0535 08/18/15 0827 08/18/15 0929  BP: 130/85 140/77  131/79  Pulse: 64 74  93  Temp: 98.5 F (36.9 C) 98.1 F (36.7 C)  98.6 F (37 C)  TempSrc: Oral Oral  Oral  Resp: 17 19  20   Height:      Weight:  176 lb 9.4 oz (80.1 kg)    SpO2: 99% 99% 98% 100%   Weight change: -4 lb 14.6 oz (-2.228 kg)  Intake/Output Summary (Last 24 hours) at 08/18/15 1421 Last data filed at 08/18/15 1300  Gross per 24 hour  Intake    600 ml  Output   1750 ml  Net  -1150 ml   General: resting in bed, no acute distress Cardiac: irregularly irregular, rate controlled <110 on telemetry Pulm: clear to auscultation bilaterally, moving normal volumes of air, no wheezing or rales appreciated Abd: soft, nontender, nondistended Ext: warm and well perfused, trace pedal edema    Assessment/Plan: Active Problems:   Malnutrition of moderate degree   Pressure ulcer   Acute respiratory failure (HCC)   Central line clotted   Encounter for central line placement   Persistent atrial fibrillation (HCC)   Nonischemic congestive cardiomyopathy (HCC)   COPD exacerbation (HCC)  A. Fib with RVR: CHADSVASc of 6 with 9.8% yearly risk of stroke. Currently rate-controlled off diltiazem drip.  -Eliquis 2.5 mg bid renal dose -Continue Metoprolol to 200 mg am and 100 mg pm -Continue Digoxin to 0.0625 po mg qd  -Switch Diltiazem from 30 mg po q6h to extended release 120 mg po qd beginning tomorrow -Telemetry  COPD Exacerbation: No appreciable wheezing on lung exam today.  -Xopenex TID -Atrovent TID  -Prednisone taper -Monitor respiratory status  Acute on CKD: Patient with history of CKD 3 thought  secondary to cardiorenal syndrome. SCr 1.95 this morning at baseline. -f/u BMP  Systolic CHF: EF A999333 on 0000000 ECHO.  -Daily weights -Strict I&Os -Metoprolol succinate 200 mg qam, 100 mg qpm  Pneumonia: Unlikely MRSA pneumonia, cultures negative, procalcitonin negative x 3. Off Vancomycin, completed 5 day Cefepime course. -f/u cultures -Off Vancomycin 3/25>>3/28 -Completed Cefepime 3/25>>> 3/29   Chronic Pain: On Percocet 10-325 mg q4hr at home prn for chronic hip and low back pain, bilateral hip replacements, and OA of multiple joints. -Percocet 5-325 mg q4h prn -Oxycodone IR 5 mg q4h prn   Dispo:  Anticipated discharge to preferred SNF today or tomorrow if bed available. D/c to home if these not available.  ADDENDUM: Informed by CSW that patient has bed available at [facility] SNF. This was not one of the two SNFs Mr. Demello had preferred, however he says he is agreeable to transfer there as it is his "only option." I informed him that we can discharge him to home if he wanted, but he states that he will try [facility] for a few days. He understands that we are not forcing him to go anywhere he does not want to go. He will be discharged to SNF today.    LOS: 6 days   Zada Finders, MD 08/18/2015, 2:21 PM

## 2015-08-18 NOTE — Clinical Social Work Placement (Addendum)
   CLINICAL SOCIAL WORK PLACEMENT  NOTE 08/18/15 - DISCHARGED TO [place]  Date:  08/18/2015  Patient Details  Name: [name] MRN: [id number] Date of Birth: 09/09/1945  Clinical Social Work is seeking post-discharge placement for this patient at the [place] level of care (*CSW will initial, date and re-position this form in  chart as items are completed):  Yes   Patient/family provided with [place] Work Department's list of facilities offering this level of care within the geographic area requested by the patient (or if unable, by the patient's family).  Yes   Patient/family informed of their freedom to choose among providers that offer the needed level of care, that participate in Medicare, Medicaid or managed care program needed by the patient, have an available bed and are willing to accept the patient.  Yes   Patient/family informed of [place]'s ownership interest in [facility] and [facility], as well as of the fact that they are under no obligation to receive care at these facilities.  PASRR submitted to EDS on       PASRR number received on       Existing PASRR number confirmed on 08/14/15     FL2 transmitted to all facilities in geographic area requested by pt/family on 08/16/15     FL2 transmitted to all facilities within larger geographic area on       Patient informed that his/her managed care company has contracts with or will negotiate with certain facilities, including the following:        Yes   Patient/family informed of bed offers received.  Patient chooses bed at [facility]     Physician recommends and patient chooses bed at      Patient to be transferred to [facility] on 08/18/15.  Patient to be transferred to facility by Ambulance     Patient family notified on 08/18/15 of transfer.  Name of family member notified:  Daughter [name] [phone] - left message for  daughter to call (patient's name not used).     PHYSICIAN       Additional Comment:    _______________________________________________ Sable Feil, LCSW 08/18/2015, 5:13 PM

## 2015-08-18 NOTE — Consult Note (Signed)
   [facility] Inpatient Consult   08/18/2015  [name] Apr 04, 1946 [id number]    [facility] Care Management follow up. Telephone call made to inpatient Licensed CSW to confirm patient's disposition plans. Made aware patient is going to go to [facility] today by inpatient Licensed CSW. Will make [place] Licensed CSW aware of disposition once patient has discharged.  Marthenia Rolling, MSN-Ed, RN,BSN [facility] Liaison [phone]

## 2015-08-18 NOTE — Progress Notes (Signed)
Discharge to [facility] with EMS via stretcher.

## 2015-08-18 NOTE — Progress Notes (Signed)
08/18/2015 4:56 PM  Attempted report a second time to [facility].  No answer at nursing desk.  SW is calling EMS at this time to transport patient.  IV removed, telemetry removed.    Princella Pellegrini

## 2015-08-18 NOTE — Progress Notes (Signed)
08/18/2015 4:23 PM  Attempted report to [facility] X[facility].  No answer from nursing desk at this time.  Will repeat attempt shortly. Princella Pellegrini

## 2015-08-18 NOTE — Discharge Summary (Signed)
Name: [name] MRN: [id number] DOB: [birth year] 70 y.o. PCP: Zada Finders, MD  Date of Admission: 08/12/2015  4:13 AM Date of Discharge: 08/18/2015 Attending Physician: Annia Belt, MD  Discharge Diagnosis: Acute hypoxic respiratory failure Active Problems:   Malnutrition of moderate degree   Pressure ulcer   Acute respiratory failure (HCC)   Central line clotted   Encounter for central line placement   Persistent atrial fibrillation (HCC)   Nonischemic congestive cardiomyopathy (HCC)   COPD exacerbation (HCC)  Discharge Medications:   Medication List    ASK your doctor about these medications        albuterol 108 (90 Base) MCG/ACT inhaler  Commonly known as:  VENTOLIN HFA  Inhale 2 puffs into the lungs every 6 (six) hours as needed for wheezing or shortness of breath.     apixaban 2.5 MG Tabs tablet  Commonly known as:  ELIQUIS  Take 1 tablet (2.5 mg total) by mouth 2 (two) times daily.     aspirin 81 MG chewable tablet  Chew 1 tablet (81 mg total) by mouth daily.     bisacodyl 10 MG suppository  Commonly known as:  DULCOLAX  Place 1 suppository (10 mg total) rectally daily as needed for severe constipation.     budesonide 0.25 MG/2ML nebulizer solution  Commonly known as:  PULMICORT  Take 2 mLs (0.25 mg total) by nebulization 2 (two) times daily.     buPROPion 75 MG tablet  Commonly known as:  WELLBUTRIN  Take 1 tablet (75 mg total) by mouth 2 (two) times daily.     cholecalciferol 1000 units tablet  Commonly known as:  VITAMIN D  Take 1,000 Units by mouth daily.     gabapentin 300 MG capsule  Commonly known as:  NEURONTIN  TAKE ONE CAPSULE BY MOUTH 3 TIMES A DAY     metoprolol succinate 100 MG 24 hr tablet  Commonly known as:  TOPROL-XL  Take 100 mg by mouth daily. Take with or immediately following a meal.     metoprolol 200 MG 24 hr tablet  Commonly known as:  TOPROL-XL  Take 2 tablets (200 mg total) in the morning.  Take 1 tablet (100  mg total) in the evening. Take with or immediately following a meal.     mirtazapine 15 MG tablet  Commonly known as:  REMERON  TAKE 1 TABLET BY MOUTH AT BEDTIME     Naloxone HCl 0.4 MG/0.4ML Soaj  Commonly known as:  EVZIO  Inject 0.4 mLs as directed as needed (opioid overdose). [name]     omeprazole 40 MG capsule  Commonly known as:  PRILOSEC  Take 1 capsule (40 mg total) by mouth daily.     oxyCODONE-acetaminophen 10-325 MG tablet  Commonly known as:  PERCOCET  Take 1 tablet by mouth every 4 (four) hours as needed for pain. Do not take more than 5 doses in a 24 hour period.     rosuvastatin 20 MG tablet  Commonly known as:  CRESTOR  Take 1 tablet (20 mg total) by mouth daily at 6 PM.     senna-docusate 8.6-50 MG tablet  Commonly known as:  [employer] SENNA PLUS  Take 1 tablet by mouth daily as needed for moderate constipation.     sodium polystyrene 15 GM/60ML suspension  Commonly known as:  KAYEXALATE  Take 15 g by mouth once.     tiotropium 18 MCG inhalation capsule  Commonly known as:  SPIRIVA  Place 1 capsule (18 mcg total) into inhaler and inhale daily.     UNABLE TO FIND  Take 120 mLs by mouth 2 (two) times daily between meals. Med Name: MedPass     Urea 37.5 % Crea  Apply 1 application topically daily as needed (for irritation).        Disposition and follow-up:   [name] was discharged from [facility] in Stable condition.  At the hospital follow up visit please address:  1.   Atrial fibrillation: Patient discharged on Metoprolol 200 mg am and 100 mg pm daily, Digoxin 0.0625 mg daily, and Diltiazem ER 120 mg po daily. Eliquis continued at  2.5 mg BID in setting of renal disease.   COPD: Complete Prednisone taper with one dose 10 mg on 08/19/15. Continue Xopenex and Atrovent three times daily as needed, Albuterol as needed.  Follow up: Follow up with Cardiology, Nephrology, and Internal Medicine Clinic.  2.  Labs / imaging  needed at time of follow-up: none  3.  Pending labs/ test needing follow-up: none  Follow-up Appointments: Follow-up Information    Follow up with [facility] .   Specialty:  [place] information:   [address] [phone]      Schedule an appointment as soon as possible for a visit with [place].   Contact information:   [address] [place] [id number]      Schedule an appointment as soon as possible for a visit with Thompson Grayer, MD.   Specialty:  Cardiology   Contact information:   [address] [place] [address] [phone]       Schedule an appointment as soon as possible for a visit with Louis Meckel, MD.   Specialty:  Nephrology   Contact information:   [place]  [phone]       Discharge Instructions:   Consultations:    Procedures Performed:  Dg Chest 2 View  07/28/2015  CLINICAL DATA:  Shortness of breath, productive cough, tachycardia, chest pain, weakness, dizziness EXAM: CHEST  2 VIEW COMPARISON:  06/16/2015 FINDINGS: Mild patchy right lower lobe opacity, suspicious for pneumonia, less likely atelectasis. Associated small right pleural effusion. Stable scarring in the left mid lung and left lung base. Volume loss in the left hemithorax. Small left pleural effusion with apical capping. No pneumothorax. The heart is top-normal in size.  Right subclavian pacemaker. IMPRESSION: Mild patchy right lower lobe opacity, suspicious for pneumonia, less likely atelectasis. Associated small bilateral pleural effusions. Chronic left lung scarring with volume loss. Electronically Signed   By: Julian Hy M.D.   On: 07/28/2015 20:00   Dg Chest Port 1 View  08/13/2015  CLINICAL DATA:  Acute respiratory failure EXAM: PORTABLE CHEST 1 VIEW COMPARISON:  08/12/2015 FINDINGS:  Cardiomediastinal silhouette is stable. Endotracheal tube in place with tip 4.1 cm above the carina. Stable NG tube position. Stable right IJ central line position. Again noted scarring and pleural thickening in left midlung and left upper lobe. Probable small left pleural effusion with left basilar atelectasis or infiltrate. No pulmonary edema. Right lung is clear. Dual lead cardiac pacemaker is unchanged in position. IMPRESSION: Stable support apparatus. Again noted scarring and pleural thickening in left midlung and left upper lobe. Probable small left pleural effusion with left basilar atelectasis or infiltrate. No pulmonary edema. Right lung is clear. Electronically  Signed   By: Lahoma Crocker M.D.   On: 08/13/2015 10:04   Dg Chest Port 1 View  08/12/2015  CLINICAL DATA:  Central line placement EXAM: PORTABLE CHEST 1 VIEW COMPARISON:  Earlier same day; 08/04/2015; 06/16/2015 FINDINGS: Interval placement of right jugular approach central venous catheter with tip projected over the superior cavoatrial junction. Stable position remaining support apparatus. No pneumothorax. Grossly unchanged cardiac silhouette and mediastinal contours with atherosclerotic plaque within the thoracic aorta. Pulmonary vasculature remains indistinct with cephalization of flow. Unchanged small left-sided effusion with associated bibasilar heterogeneous/consolidative opacities, left greater than right. Linear heterogeneous opacities with the peripheral aspect the left mid lung are unchanged in favored to represent atelectasis or scar. Unchanged bones. IMPRESSION: 1. No right jugular approach intravenous catheter tip projects over the superior cavoatrial junction. Otherwise, stable position of support apparatus. No pneumothorax. 2. Similar findings most suggestive pulmonary edema superimposed on advanced emphysematous change and background parenchymal abnormalities favored to represent atelectasis or scar. 3. Unchanged small left-sided  effusion with associated bibasilar opacities, left greater than right, atelectasis versus infiltrate. Electronically Signed   By: Sandi Mariscal M.D.   On: 08/12/2015 08:09   Dg Chest Portable 1 View  08/12/2015  CLINICAL DATA:  70 year old male with endotracheal tube placement. EXAM: PORTABLE CHEST 1 VIEW COMPARISON:  Chest radiograph dated 08/12/2015 FINDINGS: There has been interval placement of and endotracheal tube the tip approximately 2 cm above the carina. Recommend retraction and repositioning of the endotracheal tube by 3- 4 cm for optimal positioning. An enteric tube is partially visualized coursing towards the left upper abdomen with tip beyond the image margin. Focal area of consolidation is again noted in the left mid lung field. Left apical pleural thickening appears stable. There is stable moderate left pleural effusion. No pneumothorax. Stable cardiac silhouette. Right pectoral pacemaker device. No acute osseous pathology. IMPRESSION: Interval placement of an endotracheal tube with tip approximately 2 cm above the carina. Recommend retraction by approximately 3-4 cm for optimal positioning. Left mid lung field opacity and left pleural effusion appears similar to prior study. Follow-up recommended to resolution. Electronically Signed   By: Anner Crete M.D.   On: 08/12/2015 06:56   Dg Chest Port 1 View  08/12/2015  CLINICAL DATA:  Shortness of breath, weakness, recent pneumonia. History of hypertension, COPD, lung nodule. EXAM: PORTABLE CHEST 1 VIEW COMPARISON:  Chest radiograph August 04, 2015 and June 16, 2015 FINDINGS: Similar to worsening LEFT midlung zone consolidation, with stable LEFT apical pleural thickening. Moderate suspected LEFT pleural effusion associated with known elevated hemidiaphragm. Strandy densities RIGHT lung base. Cardiac silhouette is upper limits of normal in size, mediastinal silhouette is nonsuspicious, calcified aortic knob. Dual lead RIGHT cardiac pacemaker in  situ. Interval removal of nasogastric tube. Osseous structures are nonsuspicious. IMPRESSION: Similar to worsening LEFT midlung zone consolidation with small LEFT pleural effusion, stable LEFT apical pleural thickening. Followup PA and lateral chest X-ray is recommended in 3-4 weeks following trial of antibiotic therapy to ensure resolution and exclude underlying malignancy. Mild cardiomegaly.  RIGHT lung base atelectasis. Electronically Signed   By: Elon Alas M.D.   On: 08/12/2015 05:04   Dg Chest Port 1 View  08/04/2015  CLINICAL DATA:  Persistent shortness of breath. EXAM: PORTABLE CHEST 1 VIEW COMPARISON:  07/29/2015 FINDINGS: The heart is mildly enlarged but stable. There is tortuosity and calcification of the thoracic aorta. Stable extensive scarring changes involving the left lung and chronic pleural thickening. The right lung remains clear. There  is an NG tube coursing down the esophagus and into the stomach. The pacer wires are stable. IMPRESSION: Chronic left lung scarring changes and chronic pleural thickening. No definite acute overlying pulmonary process. NG tube in good position. Electronically Signed   By: Marijo Sanes M.D.   On: 08/04/2015 08:25   Dg Chest Port 1 View  07/29/2015  CLINICAL DATA:  Shortness of breath EXAM: PORTABLE CHEST 1 VIEW COMPARISON:  July 28, 2015 FINDINGS: Layering effusion on the right is identified, unchanged. Underlying opacity is seen. New mild opacity in the right mid lung. Chronic changes again seen in the left lung. Mild increasing density seen in the left mid lung as well. Pleural capping again seen on the left. The cardiomediastinal silhouette is stable. IMPRESSION: Increasing mid lung opacities bilaterally. Stable layering effusion on the right. Recommend follow-up to resolution. Electronically Signed   By: Dorise Bullion III M.D   On: 07/29/2015 15:03   Dg Abd Portable 1v  08/04/2015  CLINICAL DATA:  Ileus.  NG tube placement. EXAM: PORTABLE  ABDOMEN - 1 VIEW COMPARISON:  08/02/2015 FINDINGS: An enteric tube has been placed with tip projecting just right of midline in the upper abdomen, likely in the distal stomach. The stomach has been decompressed. Gas-filled, mildly dilated loops of small and large bowel remain throughout the abdomen, minimally improved from prior. Limited assessment for free air on this supine study. Bilateral hip arthroplasties are partially visualized. IMPRESSION: 1. Enteric tube terminates over the distal stomach. 2. Diffuse gaseous distention of small and large bowel compatible with ileus and minimally improved from prior. Electronically Signed   By: Logan Bores M.D.   On: 08/04/2015 08:17   Dg Abd Portable 1v  08/02/2015  CLINICAL DATA:  Mid abdominal pain EXAM: PORTABLE ABDOMEN - 1 VIEW COMPARISON:  10/13/2014 abdominal CT FINDINGS: Diffuse predominately gas dilated bowel from the stomach to the rectum. No gross pneumoperitoneum but limited by supine study with multiple closely positioned bowel loops. No concerning intra-abdominal mass effect or calcification. Degenerative disc disease and bilateral hip arthroplasty. IMPRESSION: Diffuse gas dilated bowel favoring adynamic ileus over low colonic obstruction. The stomach is prominently distended. Electronically Signed   By: Monte Fantasia M.D.   On: 08/02/2015 23:33    2D Echo:  - Left ventricle: The cavity size was moderately dilated. Wall  thickness was normal. The estimated ejection fraction was 30%.  Diffuse hypokinesis. - Aortic valve: There was mild regurgitation. - Mitral valve: There was mild to moderate regurgitation. - Left atrium: The atrium was moderately dilated. - Right atrium: The atrium was mildly dilated. - Tricuspid valve: There was mild-moderate regurgitation.  Cardiac Cath:   HPI:   [name] is a 70 year old male with PMH of HFrEF (30% TTE 08/14/15), Atrial Fibrillation on Eliquis with tachy/brady syndrome (failed amiodarone,  Tikosyn, Multaq in the past), CKD, COPD, HTN, and Chronic pain who was admitted to [facility] from 3/10-3/21 with Afib RVR secondary to CAP and COPD exacerbation. He required Diltiazem drip for several days and transitioned to oral Metoprolol. Home Digoxin was continued and home oral Diltiazem was discontinued per Cardiology recommendations. Eliquis dose was reduced due to renal disease. Pacemaker was checked on 08/09/2015 and noted to have normal function. He was treated for CAP with 5 days of Azithromycin. Sputum culture grew few MRSA, likely from nasopharyngeal colonization. COPD was treated with 10 days steroids and scheduled breathing treatments. He developed an ileus during that admission which required NG tube decompression  with output of over 2L initially. Ileus resolved quickly. He had hyperkalemia as well which required kayexalate several times. Patient was discharged to [facility]. At that time, patient was without chest pain, palpitations, abdominal pain, wheezing, or worsened shortness of breath from his baseline. He was tolerating oral intake and with good bowel movements. Physical exam revealed clear lungs and continued atrial fibrillation with rates from 90-110s, no peripheral edema.  The day after discharge (08/09/15), he was seen by Cardiology in an outpatient setting. At that time his Metoprolol was increased to 200 mg qam and 100 mg pm. Digoxin was discontinued due to renal disease.  Patient returned to [facility] ED on 08/12/15 with SOB and CP. He was thought to have worsening pneumonia. Code sepsis was called and treatment for HCAP was begun with IV Vanc/Cefepime. He was given 3.5 L NS in the ED. Afterwards he had worsened hypoxia and altered mental status. Patient was intubated for respiratory failure. PCCM was consulted for ICU admission. Diltiazem gtt, steroids, and antibiotics were continued. Digoxin was added back. Patient extubated on 08/14/15. Cultures were negative and procalcitonins were also  negative. Vancomycin was discontinued after 4 days, Cefepime continued to complete 5 day course (3/25-3/29). He was stable to transfer out of the ICU on 08/16/15.  Hospital Course by problem list: Active Problems:   Malnutrition of moderate degree   Pressure ulcer   Acute respiratory failure (HCC)   Central line clotted   Encounter for central line placement   Persistent atrial fibrillation (HCC)   Nonischemic congestive cardiomyopathy (HCC)   COPD exacerbation (HCC)   A. Fib with RVR: CHADSVASc of 6 with 9.8% yearly risk of stroke. Required Diltiazem gtt for rate control. Digoxin was added back. He was transitioned to oral Diltiazem ER 120 mg po daily, Metoprolol 200 mg am and 100 mg pm, and Digoxin 0.0625 mg po daily. Rate was controlled on these medications. Eliquis was resumed at 2.5 mg BID renal dosing. He had no chest pain or palpitations on discharge.  Acute hypoxic respiratory failure in COPD and HFrEF: Likely multifactorial with decreased cardiac output at baseline from underlying cardiomyopathy exacerbated by rapid heart rate, COPD exacerbation, and generous administration of IV fluids. Required intubation from 3/25-3/27. TTE showed EF of 30% with diffuse hypokinesis. He improved quickly with steroids, antibiotics, and rate control. Once extubated, breathing was controlled with Xopenex TID, Atrovent TID, and oral Prednisone. Patient's breathing was at baseline on discharge, without wheezing, rales, or rhonchi.   Pneumonia: Evidence of pneumonia on CXR. Started on broad spectrum antibiotics. Blood and sputum cultures were negative, procalcitonin was  negative x 3. Vancomycin was discontinued as was unlikely to be MRSA pneumonia (3/25>>3/28), completed 5 day Cefepime course 3/25 >> 3/29.  Acute on CKD: Patient with history of CKD 3 thought secondary to cardiorenal syndrome. SCr ranged from 1.75 - 2.55 during admission, which is at his baseline.    Discharge Vitals:   BP 113/87 mmHg   Pulse 98  Temp(Src) 98.6 F (37 C) (Oral)  Resp 18  Ht 6\' 3"  (1.905 m)  Wt 176 lb 9.4 oz (80.1 kg)  BMI 22.07 kg/m2  SpO2 100%  Discharge Labs:  Results for orders placed or performed during the hospital encounter of 08/12/15 (from the past 24 hour(s))  CBC with Differential/Platelet     Status: Abnormal   Collection Time: 08/18/15  5:43 AM  Result Value Ref Range   WBC 16.3 (H) 4.0 - 10.5 K/uL   RBC 4.27 4.22 -  5.81 MIL/uL   Hemoglobin 13.1 13.0 - 17.0 g/dL   HCT 39.6 39.0 - 52.0 %   MCV 92.7 78.0 - 100.0 fL   MCH 30.7 26.0 - 34.0 pg   MCHC 33.1 30.0 - 36.0 g/dL   RDW 16.2 (H) 11.5 - 15.5 %   Platelets 116 (L) 150 - 400 K/uL   Neutrophils Relative % 83 %   Neutro Abs 13.6 (H) 1.7 - 7.7 K/uL   Lymphocytes Relative 9 %   Lymphs Abs 1.5 0.7 - 4.0 K/uL   Monocytes Relative 8 %   Monocytes Absolute 1.2 (H) 0.1 - 1.0 K/uL   Eosinophils Relative 0 %   Eosinophils Absolute 0.0 0.0 - 0.7 K/uL   Basophils Relative 0 %   Basophils Absolute 0.0 0.0 - 0.1 K/uL  Magnesium     Status: None   Collection Time: 08/18/15  5:43 AM  Result Value Ref Range   Magnesium 1.8 1.7 - 2.4 mg/dL  Basic metabolic panel     Status: Abnormal   Collection Time: 08/18/15  5:43 AM  Result Value Ref Range   Sodium 144 135 - 145 mmol/L   Potassium 4.7 3.5 - 5.1 mmol/L   Chloride 119 (H) 101 - 111 mmol/L   CO2 18 (L) 22 - 32 mmol/L   Glucose, Bld 104 (H) 65 - 99 mg/dL   BUN 53 (H) 6 - 20 mg/dL   Creatinine, Ser 1.75 (H) 0.61 - 1.24 mg/dL   Calcium 8.5 (L) 8.9 - 10.3 mg/dL   GFR calc non Af Amer 37 (L) >60 mL/min   GFR calc Af Amer 43 (L) >60 mL/min   Anion gap 7 5 - 15   *Note: Due to a large number of results and/or encounters for the requested time period, some results have not been displayed. A complete set of results can be found in Results Review.    Signed: Zada Finders, MD 08/18/2015, 3:23 PM    Services Ordered on Discharge: SNF Equipment Ordered on Discharge: none

## 2015-08-18 NOTE — Discharge Instructions (Addendum)
Please continue your medications as prescribed. Please call your physicians if you have any issues with your medicine.  For your Atrial Fibrillation and Heart Failure: Continue Metoprolol, Digoxin, Diltiazem, and Eliquis.  For your COPD: Continue your breathing treatments.   Please follow up with Dr. [name], Dr. Rayann Heman, and Dr. Moshe Cipro.    Information on my medicine - ELIQUIS (apixaban)  This medication education was reviewed with me or my healthcare representative as part of my discharge preparation.  Why was Eliquis prescribed for you? Eliquis was prescribed for you to reduce the risk of a blood clot forming that can cause a stroke if you have a medical condition called atrial fibrillation (a type of irregular heartbeat).  What do You need to know about Eliquis ? Take your Eliquis TWICE DAILY - one tablet in the morning and one tablet in the evening with or without food. If you have difficulty swallowing the tablet whole please discuss with your pharmacist how to take the medication safely.  Take Eliquis exactly as prescribed by your doctor and DO NOT stop taking Eliquis without talking to the doctor who prescribed the medication.  Stopping may increase your risk of developing a stroke.  Refill your prescription before you run out.  After discharge, you should have regular check-up appointments with your healthcare provider that is prescribing your Eliquis.  In the future your dose may need to be changed if your kidney function or weight changes by a significant amount or as you get older.  What do you do if you miss a dose? If you miss a dose, take it as soon as you remember on the same day and resume taking twice daily.  Do not take more than one dose of ELIQUIS at the same time to make up a missed dose.  Important Safety Information A possible side effect of Eliquis is bleeding. You should call your healthcare provider right away if you experience any of the  following: ? Bleeding from an injury or your nose that does not stop. ? Unusual colored urine (red or dark brown) or unusual colored stools (red or black). ? Unusual bruising for unknown reasons. ? A serious fall or if you hit your head (even if there is no bleeding).  Some medicines may interact with Eliquis and might increase your risk of bleeding or clotting while on Eliquis. To help avoid this, consult your healthcare provider or pharmacist prior to using any new prescription or non-prescription medications, including herbals, vitamins, non-steroidal anti-inflammatory drugs (NSAIDs) and supplements.  This website has more information on Eliquis (apixaban): http://www.eliquis.com/eliquis/home

## 2015-08-19 ENCOUNTER — Telehealth: Payer: Self-pay | Admitting: Internal Medicine

## 2015-08-19 NOTE — Telephone Encounter (Signed)
   Reason for call:   I received a call from [name] at 2 PM indicating "he's in bad shape."   Pertinent Data:   He does not like his current facility and would like to seek placement elsewhere. He is unable to reach anyone at the facility and would like for [place] to find a new place to go.  He does not have any family with whom he can go to outside of the facility but does not like the care he has been receiving.   Assessment / Plan / Recommendations:   I spoke with Social Work who will be in touch with the supervisor and other staff at his SNF to make sure they address his concerns.   As always, pt is advised that if symptoms worsen or new symptoms arise, they should go to an urgent care facility or to to ER for further evaluation.   Riccardo Dubin, MD   08/19/2015, 1:45 PM

## 2015-08-21 ENCOUNTER — Other Ambulatory Visit: Payer: Self-pay | Admitting: Licensed Clinical Social Worker

## 2015-08-21 NOTE — Patient Outreach (Signed)
[facility]) Care Management  08/21/2015  [name] 07/09/1945 [id number]   Assessment-This CSW is covering for [place]. CSW received referral on patient. Patient was initially at [facility] and then was hospitalized. Patient did not wish to go back to [facility] and is now at [facility]. CSW completed chart review and saw that patient is not satisfied with his care at SNF either and would like to relocate to a different facility. CSW attempted outreach to patient but was unsuccessful. Mobile number went directly to voice message mail box and did not ring. CSW left a HIPPA compliant voice message encouraging patient to contact this CSW back. CSW also completed outreach to [facility]. CSW left a HIPPA compliant voice message for Alyse Low, discharge planner at [facility] requesting further information on patient's status.  Plan-CSW will await to hear back from both patient and SNF discharge planner.  Eula Fried, BSW, MSW, [email or web address] Phone: [phone] Fax: [phone]

## 2015-08-22 ENCOUNTER — Other Ambulatory Visit: Payer: Self-pay | Admitting: Licensed Clinical Social Worker

## 2015-08-22 NOTE — Patient Outreach (Signed)
[facility]) Care Management  08/22/2015  [name] 1945/06/22 [id number]   Assessment-CSW received return call from SNF social worker [facility]. [name] states that patient is still at facility and that they completed morning meeting today and "it seems that all concerning issues of the patient were resolved." CSW informed [name] that [facility] will be in touch in regards to this patient.  Plan-CSW will update CSW [employer]  Eula Fried, BSW, MSW, [email or web address] Phone: [phone] Fax: [phone]

## 2015-08-23 DIAGNOSIS — I481 Persistent atrial fibrillation: Secondary | ICD-10-CM | POA: Diagnosis not present

## 2015-08-23 DIAGNOSIS — R400 Somnolence: Secondary | ICD-10-CM | POA: Diagnosis not present

## 2015-08-23 DIAGNOSIS — G4700 Insomnia, unspecified: Secondary | ICD-10-CM | POA: Diagnosis not present

## 2015-08-24 ENCOUNTER — Emergency Department (HOSPITAL_COMMUNITY): Payer: Commercial Managed Care - HMO

## 2015-08-24 ENCOUNTER — Inpatient Hospital Stay (HOSPITAL_COMMUNITY)
Admission: EM | Admit: 2015-08-24 | Discharge: 2015-08-27 | DRG: 308 | Disposition: A | Discharge status: Skilled Nursing Facility | Payer: Commercial Managed Care - HMO | Attending: Internal Medicine | Admitting: Internal Medicine

## 2015-08-24 ENCOUNTER — Encounter (HOSPITAL_COMMUNITY): Payer: Self-pay | Admitting: Emergency Medicine

## 2015-08-24 DIAGNOSIS — J189 Pneumonia, unspecified organism: Secondary | ICD-10-CM

## 2015-08-24 DIAGNOSIS — I2510 Atherosclerotic heart disease of native coronary artery without angina pectoris: Secondary | ICD-10-CM | POA: Diagnosis present

## 2015-08-24 DIAGNOSIS — Z8249 Family history of ischemic heart disease and other diseases of the circulatory system: Secondary | ICD-10-CM | POA: Diagnosis not present

## 2015-08-24 DIAGNOSIS — I4891 Unspecified atrial fibrillation: Principal | ICD-10-CM | POA: Diagnosis present

## 2015-08-24 DIAGNOSIS — Z87891 Personal history of nicotine dependence: Secondary | ICD-10-CM

## 2015-08-24 DIAGNOSIS — I252 Old myocardial infarction: Secondary | ICD-10-CM | POA: Diagnosis not present

## 2015-08-24 DIAGNOSIS — J80 Acute respiratory distress syndrome: Secondary | ICD-10-CM | POA: Diagnosis not present

## 2015-08-24 DIAGNOSIS — Z96643 Presence of artificial hip joint, bilateral: Secondary | ICD-10-CM | POA: Diagnosis present

## 2015-08-24 DIAGNOSIS — Z7901 Long term (current) use of anticoagulants: Secondary | ICD-10-CM

## 2015-08-24 DIAGNOSIS — F418 Other specified anxiety disorders: Secondary | ICD-10-CM | POA: Diagnosis present

## 2015-08-24 DIAGNOSIS — I5023 Acute on chronic systolic (congestive) heart failure: Secondary | ICD-10-CM

## 2015-08-24 DIAGNOSIS — J441 Chronic obstructive pulmonary disease with (acute) exacerbation: Secondary | ICD-10-CM | POA: Diagnosis not present

## 2015-08-24 DIAGNOSIS — J9621 Acute and chronic respiratory failure with hypoxia: Secondary | ICD-10-CM | POA: Diagnosis not present

## 2015-08-24 DIAGNOSIS — G8929 Other chronic pain: Secondary | ICD-10-CM | POA: Diagnosis present

## 2015-08-24 DIAGNOSIS — Z77090 Contact with and (suspected) exposure to asbestos: Secondary | ICD-10-CM | POA: Diagnosis present

## 2015-08-24 DIAGNOSIS — F319 Bipolar disorder, unspecified: Secondary | ICD-10-CM | POA: Diagnosis present

## 2015-08-24 DIAGNOSIS — K219 Gastro-esophageal reflux disease without esophagitis: Secondary | ICD-10-CM | POA: Diagnosis present

## 2015-08-24 DIAGNOSIS — Z888 Allergy status to other drugs, medicaments and biological substances status: Secondary | ICD-10-CM

## 2015-08-24 DIAGNOSIS — Z950 Presence of cardiac pacemaker: Secondary | ICD-10-CM

## 2015-08-24 DIAGNOSIS — I481 Persistent atrial fibrillation: Secondary | ICD-10-CM | POA: Diagnosis not present

## 2015-08-24 DIAGNOSIS — R069 Unspecified abnormalities of breathing: Secondary | ICD-10-CM | POA: Diagnosis not present

## 2015-08-24 DIAGNOSIS — I429 Cardiomyopathy, unspecified: Secondary | ICD-10-CM | POA: Diagnosis present

## 2015-08-24 DIAGNOSIS — R0600 Dyspnea, unspecified: Secondary | ICD-10-CM | POA: Diagnosis present

## 2015-08-24 DIAGNOSIS — Y95 Nosocomial condition: Secondary | ICD-10-CM | POA: Diagnosis present

## 2015-08-24 DIAGNOSIS — I10 Essential (primary) hypertension: Secondary | ICD-10-CM | POA: Diagnosis not present

## 2015-08-24 DIAGNOSIS — E875 Hyperkalemia: Secondary | ICD-10-CM | POA: Diagnosis present

## 2015-08-24 DIAGNOSIS — Z823 Family history of stroke: Secondary | ICD-10-CM | POA: Diagnosis not present

## 2015-08-24 DIAGNOSIS — I130 Hypertensive heart and chronic kidney disease with heart failure and stage 1 through stage 4 chronic kidney disease, or unspecified chronic kidney disease: Secondary | ICD-10-CM | POA: Diagnosis present

## 2015-08-24 DIAGNOSIS — Z86718 Personal history of other venous thrombosis and embolism: Secondary | ICD-10-CM

## 2015-08-24 DIAGNOSIS — N183 Chronic kidney disease, stage 3 (moderate): Secondary | ICD-10-CM | POA: Diagnosis present

## 2015-08-24 DIAGNOSIS — J440 Chronic obstructive pulmonary disease with acute lower respiratory infection: Secondary | ICD-10-CM | POA: Diagnosis not present

## 2015-08-24 DIAGNOSIS — Z79899 Other long term (current) drug therapy: Secondary | ICD-10-CM

## 2015-08-24 DIAGNOSIS — Z880 Allergy status to penicillin: Secondary | ICD-10-CM

## 2015-08-24 DIAGNOSIS — E870 Hyperosmolality and hypernatremia: Secondary | ICD-10-CM | POA: Diagnosis not present

## 2015-08-24 DIAGNOSIS — F419 Anxiety disorder, unspecified: Secondary | ICD-10-CM | POA: Diagnosis present

## 2015-08-24 DIAGNOSIS — E785 Hyperlipidemia, unspecified: Secondary | ICD-10-CM | POA: Diagnosis present

## 2015-08-24 DIAGNOSIS — R2681 Unsteadiness on feet: Secondary | ICD-10-CM | POA: Diagnosis not present

## 2015-08-24 DIAGNOSIS — I482 Chronic atrial fibrillation: Secondary | ICD-10-CM | POA: Diagnosis not present

## 2015-08-24 DIAGNOSIS — I4892 Unspecified atrial flutter: Secondary | ICD-10-CM | POA: Diagnosis present

## 2015-08-24 DIAGNOSIS — R488 Other symbolic dysfunctions: Secondary | ICD-10-CM | POA: Diagnosis not present

## 2015-08-24 DIAGNOSIS — R0602 Shortness of breath: Secondary | ICD-10-CM | POA: Diagnosis not present

## 2015-08-24 DIAGNOSIS — M6281 Muscle weakness (generalized): Secondary | ICD-10-CM | POA: Diagnosis not present

## 2015-08-24 DIAGNOSIS — G4700 Insomnia, unspecified: Secondary | ICD-10-CM | POA: Diagnosis not present

## 2015-08-24 HISTORY — DX: Pneumonia, unspecified organism: J18.9

## 2015-08-24 LAB — BASIC METABOLIC PANEL
ANION GAP: 13 (ref 5–15)
Anion gap: 12 (ref 5–15)
BUN: 37 mg/dL — ABNORMAL HIGH (ref 6–20)
BUN: 38 mg/dL — ABNORMAL HIGH (ref 6–20)
CALCIUM: 8.9 mg/dL (ref 8.9–10.3)
CHLORIDE: 112 mmol/L — AB (ref 101–111)
CO2: 21 mmol/L — AB (ref 22–32)
CO2: 21 mmol/L — AB (ref 22–32)
Calcium: 8.3 mg/dL — ABNORMAL LOW (ref 8.9–10.3)
Chloride: 116 mmol/L — ABNORMAL HIGH (ref 101–111)
Creatinine, Ser: 1.66 mg/dL — ABNORMAL HIGH (ref 0.61–1.24)
Creatinine, Ser: 1.56 mg/dL — ABNORMAL HIGH (ref 0.61–1.24)
GFR calc Af Amer: 46 mL/min — ABNORMAL LOW (ref >60)
GFR calc Af Amer: 50 mL/min — ABNORMAL LOW (ref >60)
GFR calc non Af Amer: 40 mL/min — ABNORMAL LOW (ref >60)
GFR calc non Af Amer: 43 mL/min — ABNORMAL LOW (ref >60)
GLUCOSE: 125 mg/dL — AB (ref 65–99)
GLUCOSE: 150 mg/dL — AB (ref 65–99)
Potassium: 5.9 mmol/L — ABNORMAL HIGH (ref 3.5–5.1)
Potassium: 4.8 mmol/L (ref 3.5–5.1)
Sodium: 146 mmol/L — ABNORMAL HIGH (ref 135–145)
Sodium: 149 mmol/L — ABNORMAL HIGH (ref 135–145)

## 2015-08-24 LAB — CBC WITH DIFFERENTIAL/PLATELET
BASOS PCT: 0 %
Basophils Absolute: 0 10*3/uL (ref 0.0–0.1)
Eosinophils Absolute: 0.1 10*3/uL (ref 0.0–0.7)
Eosinophils Relative: 1 %
HEMATOCRIT: 46.8 % (ref 39.0–52.0)
HEMOGLOBIN: 14.8 g/dL (ref 13.0–17.0)
LYMPHS ABS: 1.7 10*3/uL (ref 0.7–4.0)
LYMPHS PCT: 19 %
MCH: 30.6 pg (ref 26.0–34.0)
MCHC: 31.6 g/dL (ref 30.0–36.0)
MCV: 96.7 fL (ref 78.0–100.0)
MONOS PCT: 10 %
Monocytes Absolute: 0.8 10*3/uL (ref 0.1–1.0)
NEUTROS ABS: 6 10*3/uL (ref 1.7–7.7)
NEUTROS PCT: 70 %
Platelets: 132 10*3/uL — ABNORMAL LOW (ref 150–400)
RBC: 4.84 MIL/uL (ref 4.22–5.81)
RDW: 17.6 % — ABNORMAL HIGH (ref 11.5–15.5)
WBC: 8.6 10*3/uL (ref 4.0–10.5)

## 2015-08-24 LAB — I-STAT TROPONIN, ED: TROPONIN I, POC: 0.11 ng/mL — AB (ref 0.00–0.08)

## 2015-08-24 LAB — RAPID URINE DRUG SCREEN, HOSP PERFORMED
AMPHETAMINES: NOT DETECTED
BENZODIAZEPINES: NOT DETECTED
Barbiturates: NOT DETECTED
Cocaine: NOT DETECTED
OPIATES: NOT DETECTED
Tetrahydrocannabinol: NOT DETECTED

## 2015-08-24 LAB — BRAIN NATRIURETIC PEPTIDE: B Natriuretic Peptide: 1529.1 pg/mL — ABNORMAL HIGH (ref 0.0–100.0)

## 2015-08-24 LAB — URINALYSIS, ROUTINE W REFLEX MICROSCOPIC
Bilirubin Urine: NEGATIVE
GLUCOSE, UA: NEGATIVE mg/dL
Hgb urine dipstick: NEGATIVE
KETONES UR: NEGATIVE mg/dL
LEUKOCYTES UA: NEGATIVE
NITRITE: NEGATIVE
PROTEIN: 100 mg/dL — AB
Specific Gravity, Urine: 1.02 (ref 1.005–1.030)
pH: 5.5 (ref 5.0–8.0)

## 2015-08-24 LAB — DIGOXIN LEVEL: DIGOXIN LVL: 0.4 ng/mL — AB (ref 0.8–2.0)

## 2015-08-24 LAB — I-STAT CG4 LACTIC ACID, ED: LACTIC ACID, VENOUS: 0.9 mmol/L (ref 0.5–2.0)

## 2015-08-24 LAB — MRSA PCR SCREENING: MRSA BY PCR: NEGATIVE

## 2015-08-24 LAB — POTASSIUM: POTASSIUM: 6.6 mmol/L — AB (ref 3.5–5.1)

## 2015-08-24 LAB — URINE MICROSCOPIC-ADD ON

## 2015-08-24 LAB — PROCALCITONIN: Procalcitonin: <0.1 ng/mL

## 2015-08-24 MED ORDER — ALBUTEROL SULFATE HFA 108 (90 BASE) MCG/ACT IN AERS: 2.0000 | INHALATION_SPRAY | Freq: Four times a day (QID) | RESPIRATORY_TRACT | Status: DC | PRN

## 2015-08-24 MED ORDER — DILTIAZEM HCL 100 MG IV SOLR
5.0000 mg/h | INTRAVENOUS | Status: DC
  Administered 2015-08-24: 5 mg/h via INTRAVENOUS
  Administered 2015-08-24: 7.5 mg/h via INTRAVENOUS
  Filled 2015-08-24 (×2): qty 100

## 2015-08-24 MED ORDER — MIRTAZAPINE 15 MG PO TABS
15.0000 mg | ORAL_TABLET | Freq: Every day | ORAL | Status: DC
  Administered 2015-08-24 – 2015-08-26 (×3): 15 mg via ORAL
  Filled 2015-08-24 (×3): qty 1

## 2015-08-24 MED ORDER — INSULIN ASPART 100 UNIT/ML IV SOLN
10.0000 [IU] | Freq: Once | INTRAVENOUS | Status: AC
  Administered 2015-08-24: 10 [IU] via INTRAVENOUS
  Filled 2015-08-24: qty 1

## 2015-08-24 MED ORDER — ASPIRIN 81 MG PO CHEW
81.0000 mg | CHEWABLE_TABLET | Freq: Every day | ORAL | Status: DC
  Administered 2015-08-24 – 2015-08-27 (×4): 81 mg via ORAL
  Filled 2015-08-24 (×5): qty 1

## 2015-08-24 MED ORDER — SODIUM CHLORIDE 0.9% FLUSH
3.0000 mL | Freq: Two times a day (BID) | INTRAVENOUS | Status: DC
  Administered 2015-08-24 – 2015-08-26 (×5): 3 mL via INTRAVENOUS

## 2015-08-24 MED ORDER — DILTIAZEM LOAD VIA INFUSION
5.0000 mg | Freq: Once | INTRAVENOUS | Status: AC
Start: 2015-08-24 — End: 2015-08-24
  Administered 2015-08-24: 5 mg via INTRAVENOUS
  Filled 2015-08-24: qty 5

## 2015-08-24 MED ORDER — APIXABAN 2.5 MG PO TABS
2.5000 mg | ORAL_TABLET | Freq: Two times a day (BID) | ORAL | Status: DC
  Administered 2015-08-24 – 2015-08-27 (×6): 2.5 mg via ORAL
  Filled 2015-08-24 (×6): qty 1

## 2015-08-24 MED ORDER — SODIUM POLYSTYRENE SULFONATE 15 GM/60ML PO SUSP
30.0000 g | Freq: Once | ORAL | Status: AC
  Administered 2015-08-24: 30 g via ORAL
  Filled 2015-08-24: qty 120

## 2015-08-24 MED ORDER — BUPROPION HCL 75 MG PO TABS
75.0000 mg | ORAL_TABLET | Freq: Two times a day (BID) | ORAL | Status: DC
Start: 2015-08-24 — End: 2015-08-27
  Administered 2015-08-24 – 2015-08-27 (×6): 75 mg via ORAL
  Filled 2015-08-24 (×7): qty 1

## 2015-08-24 MED ORDER — DIGOXIN 125 MCG PO TABS
0.0625 mg | ORAL_TABLET | Freq: Every day | ORAL | Status: DC
  Administered 2015-08-24 – 2015-08-27 (×4): 0.0625 mg via ORAL
  Filled 2015-08-24 (×4): qty 1

## 2015-08-24 MED ORDER — ENSURE ENLIVE PO LIQD
1.0000 | Freq: Three times a day (TID) | ORAL | Status: DC
  Administered 2015-08-25: 237 mL via ORAL

## 2015-08-24 MED ORDER — SODIUM CHLORIDE 0.9 % IV BOLUS (SEPSIS)
500.0000 mL | Freq: Once | INTRAVENOUS | Status: AC
  Administered 2015-08-24: 500 mL via INTRAVENOUS

## 2015-08-24 MED ORDER — BUDESONIDE 0.25 MG/2ML IN SUSP
0.2500 mg | Freq: Two times a day (BID) | RESPIRATORY_TRACT | Status: DC
  Administered 2015-08-24 – 2015-08-27 (×5): 0.25 mg via RESPIRATORY_TRACT
  Filled 2015-08-24 (×6): qty 2

## 2015-08-24 MED ORDER — DEXTROSE 5 % IV SOLN
2.0000 g | Freq: Once | INTRAVENOUS | Status: AC
  Administered 2015-08-24: 2 g via INTRAVENOUS
  Filled 2015-08-24: qty 2

## 2015-08-24 MED ORDER — DEXTROSE 50 % IV SOLN
1.0000 | Freq: Once | INTRAVENOUS | Status: AC
  Administered 2015-08-24: 50 mL via INTRAVENOUS
  Filled 2015-08-24: qty 50

## 2015-08-24 MED ORDER — ALBUTEROL SULFATE (2.5 MG/3ML) 0.083% IN NEBU
10.0000 mg | INHALATION_SOLUTION | Freq: Once | RESPIRATORY_TRACT | Status: AC
  Administered 2015-08-24: 10 mg via RESPIRATORY_TRACT
  Filled 2015-08-24: qty 12

## 2015-08-24 MED ORDER — PANTOPRAZOLE SODIUM 40 MG PO TBEC
40.0000 mg | DELAYED_RELEASE_TABLET | Freq: Every day | ORAL | Status: DC
  Administered 2015-08-24 – 2015-08-27 (×4): 40 mg via ORAL
  Filled 2015-08-24 (×4): qty 1

## 2015-08-24 MED ORDER — IPRATROPIUM-ALBUTEROL 0.5-2.5 (3) MG/3ML IN SOLN
3.0000 mL | RESPIRATORY_TRACT | Status: DC | PRN
  Administered 2015-08-26: 3 mL via RESPIRATORY_TRACT
  Filled 2015-08-24: qty 3

## 2015-08-24 MED ORDER — ALBUTEROL SULFATE (2.5 MG/3ML) 0.083% IN NEBU: 2.5000 mg | INHALATION_SOLUTION | Freq: Four times a day (QID) | RESPIRATORY_TRACT | Status: DC | PRN

## 2015-08-24 MED ORDER — VANCOMYCIN HCL 10 G IV SOLR
1500.0000 mg | Freq: Once | INTRAVENOUS | Status: AC
  Administered 2015-08-24: 1500 mg via INTRAVENOUS
  Filled 2015-08-24: qty 1500

## 2015-08-24 MED ORDER — ROSUVASTATIN CALCIUM 20 MG PO TABS: 20.0000 mg | ORAL_TABLET | Freq: Every day | ORAL | Status: DC

## 2015-08-24 MED ORDER — ACETAMINOPHEN 325 MG PO TABS
650.0000 mg | ORAL_TABLET | Freq: Four times a day (QID) | ORAL | Status: DC | PRN
  Administered 2015-08-24 – 2015-08-27 (×4): 650 mg via ORAL
  Filled 2015-08-24 (×4): qty 2

## 2015-08-24 NOTE — ED Notes (Signed)
IV team at bedside 

## 2015-08-24 NOTE — H&P (Signed)
Date: 08/24/2015               Patient Name:  [name] MRN: [id number]  DOB: Jul 20, 1945 Age / Sex: 70 y.o., male   PCP: Zada Finders, MD         Medical Service: Internal Medicine Teaching Service         Attending Physician: Dr. [name], MD    First Contact: Dr. Lovena Le Pager: [phone]  Second Contact: Dr. Arcelia Jew Pager: [phone]       After Hours (After 5p/  First Contact Pager: [phone]  weekends / holidays): Second Contact Pager: [phone]   Chief Complaint: Shortness of breath  History of Present Illness: [name] is a 70 y.o. male w/ PMHx of HTN, HLD, CKD stage 3, COPD, h/o DVT on Eliquis, Tachy-Brady Syndrome s/p PPM placement, refractory atrial fibrillation (previously on Multaq, Tikosyn, Amio), chronic systolic CHF (EF 15%), CAD, anxiety, and depression, presents to the ED from his skilled nursing facility with worsening dyspnea. Patient says this has been going on for 2 days, accompanied by a mild, non-productive cough, diaphoresis, mild abdominal pain, and mild dull chest discomfort. No nausea, or vomiting, no reported fevers, chills. Also notes dizziness, lightheadedness, and palpitations.   Patient was recently admitted from 3/11 - 3/21 for rapid atrial fibrillation, hyperkalemia, and ileus, then again from 3/25 - 3/31 for dyspnea, thought to have sepsis 2/2 HCAP initially, as well as rapid atrial fibrillation again, required mechanical ventilation. CXR with chronic appearing findings. Patient extubated on 3/28. Procalcitonin x3 during that admission were 0. Patient's rate was controlled with both Cardizem + Metoprolol + Digoxin. Also treated with course of steroids for suspected COPD as well.   In the ED today, patient with normal white count, normal/mildly low BP, quite tachycardic to the 140-150's, no fever. SpO2 100% on 2L via [place], although patient is mildly tachypneic, using some accessory muscles. CXR appears similar to previous as seen on recent  discharge. Cr at baseline, no lactic acidosis. Patient treated with Vancomycin + Aztreonam for HCAP coverage, given nebulizer for SOB. BNP elevated to 1529, chronic mild baseline elevation of troponin (0.11), K 5.9 initially, repeat 6.6.    Meds: Current Facility-Administered Medications  Medication Dose Route Frequency Provider Last Rate Last Dose  . albuterol (PROVENTIL HFA;VENTOLIN HFA) 108 (90 Base) MCG/ACT inhaler 2 puff  2 puff Inhalation Q6H PRN Corky Sox, MD      . apixaban Arne Cleveland) tablet 2.5 mg  2.5 mg Oral BID Corky Sox, MD      . aspirin chewable tablet 81 mg  81 mg Oral Daily Corky Sox, MD      . budesonide (PULMICORT) nebulizer solution 0.25 mg  0.25 mg Nebulization BID Corky Sox, MD      . buPROPion [facility]) tablet 75 mg  75 mg Oral BID Corky Sox, MD      . digoxin [facility]) tablet 0.0625 mg  0.0625 mg Oral Daily Corky Sox, MD      . diltiazem (CARDIZEM) 100 mg in dextrose 5 % 100 mL (1 mg/mL) infusion  5 mg/hr Intravenous Continuous Waynetta Pean, PA-C 5 mL/hr at 08/24/15 0940 5 mg/hr at 08/24/15 0940  . ENSURE CLEAR LIQD 1 each  1 each Oral TID Corky Sox, MD      . ipratropium-albuterol (DUONEB) 0.5-2.5 (3) MG/3ML nebulizer solution 3 mL  3 mL Nebulization Q4H PRN Corky Sox, MD      .  mirtazapine (REMERON) tablet 15 mg  15 mg Oral QHS Corky Sox, MD      . pantoprazole (PROTONIX) EC tablet 40 mg  40 mg Oral Daily Corky Sox, MD      . rosuvastatin (CRESTOR) tablet 20 mg  20 mg Oral q1800 Corky Sox, MD      . sodium chloride flush (NS) 0.9 % injection 3 mL  3 mL Intravenous Q12H Corky Sox, MD      . vancomycin (VANCOCIN) 1,500 mg in sodium chloride 0.9 % 500 mL IVPB  1,500 mg Intravenous Once Davonna Belling, MD       Current Outpatient Prescriptions  Medication Sig Dispense Refill  . albuterol (VENTOLIN HFA) 108 (90 BASE) MCG/ACT inhaler Inhale 2 puffs into the lungs every 6 (six) hours as needed for wheezing or shortness of breath. 3  Inhaler 3  . apixaban (ELIQUIS) 2.5 MG TABS tablet Take 1 tablet (2.5 mg total) by mouth 2 (two) times daily. 60 tablet 3  . aspirin 81 MG chewable tablet Chew 1 tablet (81 mg total) by mouth daily. 30 tablet 12  . bisacodyl (DULCOLAX) 10 MG suppository Place 1 suppository (10 mg total) rectally daily as needed for severe constipation. 12 suppository 5  . budesonide (PULMICORT) 0.25 MG/2ML nebulizer solution Take 2 mLs (0.25 mg total) by nebulization 2 (two) times daily. 60 mL 12  . buPROPion (WELLBUTRIN) 75 MG tablet Take 1 tablet (75 mg total) by mouth 2 (two) times daily. 180 tablet 3  . cholecalciferol (VITAMIN D) 1000 units tablet Take 1,000 Units by mouth daily.    [name] gabapentin (NEURONTIN) 300 MG capsule TAKE ONE CAPSULE BY MOUTH 3 TIMES A DAY 60 capsule 2  . metoprolol succinate (TOPROL-XL) 100 MG 24 hr tablet Take 100-200 mg by mouth See admin instructions. Take 2 tablets every morning and take 1 tablet every evening    . mirtazapine (REMERON) 15 MG tablet TAKE 1 TABLET BY MOUTH AT BEDTIME 30 tablet 5  . Multiple Vitamin (MULTIVITAMIN WITH MINERALS) TABS tablet Take 1 tablet by mouth daily.    . Nutritional Supplements (ENSURE CLEAR) LIQD Take 1 each by mouth 3 (three) times daily.    [name] omeprazole (PRILOSEC) 20 MG capsule Take 20 mg by mouth daily.    [name] oxyCODONE-acetaminophen (PERCOCET/ROXICET) 5-325 MG tablet Take 1 tablet by mouth every 6 (six) hours as needed for severe pain.    [name] senna-docusate ([employer] SENNA PLUS) 8.6-50 MG tablet Take 1 tablet by mouth daily as needed for moderate constipation. 360 tablet 3  . tiotropium (SPIRIVA) 18 MCG inhalation capsule Place 1 capsule (18 mcg total) into inhaler and inhale daily. 90 capsule 3  . Urea 37.5 % CREA Apply 1 application topically daily as needed (for irritation). 142 g 0  . digoxin 62.5 MCG TABS Take 0.0625 mg by mouth daily. (Patient not taking: Reported on 08/24/2015) 30 tablet 2  . diltiazem (CARDIZEM CD) 120 MG 24 hr capsule Take 1  capsule (120 mg total) by mouth daily. (Patient not taking: Reported on 08/24/2015) 30 capsule 2  . ipratropium (ATROVENT) 0.02 % nebulizer solution Take 2.5 mLs (0.5 mg total) by nebulization 3 (three) times daily. (Patient not taking: Reported on 08/24/2015) 75 mL 12  . levalbuterol (XOPENEX) 0.63 MG/3ML nebulizer solution Take 3 mLs (0.63 mg total) by nebulization 3 (three) times daily. (Patient not taking: Reported on 08/24/2015) 3 mL 12  . metoprolol (TOPROL-XL) 200 MG 24 hr tablet Take 2 tablets (  200 mg total) in the morning.  Take 1 tablet (100 mg total) in the evening. Take with or immediately following a meal. (Patient not taking: Reported on 08/24/2015) 90 tablet 3  . Naloxone HCl (EVZIO) 0.4 MG/0.4ML SOAJ Inject 0.4 mLs as directed as needed (opioid overdose). [name] (Patient not taking: Reported on 08/24/2015) 1 Package 1  . omeprazole (PRILOSEC) 40 MG capsule Take 1 capsule (40 mg total) by mouth daily. (Patient not taking: Reported on 08/24/2015) 90 capsule 3  . oxyCODONE-acetaminophen (PERCOCET) 10-325 MG tablet Take 1 tablet by mouth every 4 (four) hours as needed for pain. Do not take more than 5 doses in a 24 hour period. (Patient not taking: Reported on 08/24/2015) 150 tablet 0  . rosuvastatin (CRESTOR) 20 MG tablet Take 1 tablet (20 mg total) by mouth daily at 6 PM. (Patient not taking: Reported on 08/24/2015) 90 tablet 3    Allergies: Allergies as of 08/24/2015 - Review Complete 08/24/2015  Allergen Reaction Noted  . Amiodarone  07/30/2015  . Penicillins Rash    Past Medical History  Diagnosis Date  . Hypertension   . Chronic systolic heart failure ([place])     a. NICM - EF 35% with normal cors 2003. b. Last echo 11/2012 - EF 25-30%.  . Depression   . GERD (gastroesophageal reflux disease)   . Portal vein thrombosis   . Avascular necrosis of bones of both hips (HCC)     Status post bilateral hip replacements  . Gastritis   . Alcohol abuse   . Erectile dysfunction   . Bipolar  disorder ([place])   . Hydrocele, unspecified   . Lung nodule     Chest CT scan on 12/14/2008 showed a nodular opacity at the left lung base felt to most likely represent scarring.  Follow-up chest CT scan on 06/02/2010 showed parenchymal scarring in the left apex, left  lower lobe, and lingula; some of this scarring at the left base had a nodular appearance, unchanged. Chest 09/2012 - stable.  . 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  Thyroid scan 08/28/2009 was normal with no focal areas of abnormal increased or decreased activity seen; the uptake of I 131 sodium iodide at 24 hours was 5.7%.  TSH and free T4 normalized by 08/16/2009.  . Intestinal obstruction ([place])   . COPD (chronic obstructive pulmonary disease) ([place])   . E coli bacteremia   . DVT (deep venous thrombosis) ([place])     He has hypercoagulability with multiple prior DVTs  . Pleural plaque     H/o asbestos exposure. Chest CT on 06/02/2010 showed stable extensive calcified pleural plaques involving the left hemithorax, consistent with asbestos related pleural disease.  . Weight loss     Normal colonoscopy by Dr. Olevia Perches on 11/06/2010.  . Tachycardia-bradycardia syndrome [facility])     a. 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 b. St. Jude gen change 2010.  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  . Noncompliance   . Thrombocytopenia ([place])   . Osteoarthritis   . Spondylosis   . Anxiety   . CKD (chronic kidney disease)     Renal U/S 12/04/2009 showed no pathological findings. Labs 12/04/2009 include normal ESR, C3, C4; neg ANA; SPEP showed nonspecific increase in the alpha-2 region with no M-spike; UPEP showed no monoclonal free light chains; urine IFE showed polyclonal increase in  feree Kappa and/or free Lambda light chains. Baseline Cr reported 1.7-2.5.  . STEMI (ST elevation myocardial infarction)  ([place]) 10/11/2014  . Hyperlipemia    Past Surgical History  Procedure Laterality Date  . Total hip arthroplasty Right 03/08/2008     By Dr. Hiram Comber  . Tee without cardioversion N/A 05/27/2013    Procedure: TRANSESOPHAGEAL ECHOCARDIOGRAM (TEE);  Surgeon: Candee Furbish, MD;  Location: [facility] ENDOSCOPY;  Service: Cardiovascular;  Laterality: N/A;  . Cardioversion N/A 05/27/2013    Procedure: CARDIOVERSION;  Surgeon: Candee Furbish, MD;  Location: [facility] ENDOSCOPY;  Service: Cardiovascular;  Laterality: N/A;  . Cardiac catheterization  2003    Nl cors, EF 35%  . Embolectomy Right 12/10/2013    Procedure: Right Femoral Embolectomy; Fasciotomy Right Lower Leg with Intraoperative arteriogram;  Surgeon: Rosetta Posner, MD;  Location: [place];  Service: Vascular;  Laterality: Right;  . Intraoperative arteriogram Right 12/10/2013    Procedure: INTRA OPERATIVE ARTERIOGRAM;  Surgeon: Rosetta Posner, MD;  Location: [place];  Service: Vascular;  Laterality: Right;  . Fasciotomy closure Right 12/15/2013    Procedure: LEG FASCIOTOMY CLOSURE;  Surgeon: Rosetta Posner, MD;  Location: [facility] OR;  Service: Vascular;  Laterality: Right;  . Embolectomy Left 10/12/2014    Procedure: Left Femoral Thrombectomy with intraoperative arteriogram;  Surgeon: Rosetta Posner, MD;  Location[facility] OR;  Service: Vascular;  Laterality: Left;  . Hemiarthroplasty hip Left 09/09/2002    bipolar; Dr. Lafayette Dragon  . Joint replacement    . Insert / replace / remove pacemaker  02/2003    Dr. Roe Rutherford, [place] pulse generator identity SR model [id number] serial [id number]; gen change by Greggory Brandy  . Insert / replace / remove pacemaker  06/17/2008    Lead and generator change w/ St. Jude Medical Tendril ST model [id number] (serial  [id number]).        Family History  Problem Relation Age of Onset  . Hypertension Mother   . Cancer Brother     Unsure of type.  . Asthma Mother   . Coronary artery disease Mother   . Colon cancer Neg Hx   . Lung cancer Neg Hx   .  Prostate cancer Neg Hx   . Stroke      mother  . Heart attack      brother and sister ?age    Social History   Social History  . Marital Status: Widowed    Spouse Name: N/A  . Number of Children: 10  . Years of Education: N/A   Occupational History  . RETIRED    Social History Main Topics  . Smoking status: Former Smoker -- 0.33 packs/day for 54 years    Types: Cigarettes    Quit date: 10/11/2014  . Smokeless tobacco: Never Used  . Alcohol Use: No     Comment: 6/22//2016 "h/o etoh abuse; stopped drinking ~ 2012"  . Drug Use: No     Comment: 11/09/2014 "none since 10/11/2014; did smoke marijuana ~ 1 time/month"  . Sexual Activity: Not Currently   Other Topics Concern  . Not on file   Social History Narrative   Works as a Architect part time   Smoker 1 pack last 1.5 days tobacco, smokes marijuana    Denies EtOH x 4 years (on 10/12/12)   9 kids    From [place]   [place] Veteran    12 grade education  Review of Systems: General: Positive for diaphoresis and fatigue. Denies fever, chills, appetite change.  Respiratory: Positive for SOB, cough. Denies chest tightness, and wheezing.   Cardiovascular: Positive for mild chest discomfort and palpitations.  Gastrointestinal: Positive for mild abdominal pain. Denies nausea, vomiting, diarrhea, constipation, blood in stool and abdominal distention.  Genitourinary: Denies dysuria, urgency, frequency, hematuria, and flank pain. Endocrine: Denies hot or cold intolerance, polyuria, and polydipsia. Musculoskeletal: Positive for chronic pain. Denies joint swelling, arthralgias and gait problem.  Skin: Denies pallor, rash and wounds.  Neurological: Positive for dizziness, lightheadedness, and generalized weakness. Denies seizures, syncope, numbness and headaches.  Psychiatric/Behavioral: Denies mood changes, confusion, nervousness, sleep disturbance and agitation.   Physical Exam: Blood pressure 104/91, pulse 64,  temperature 98.6 F (37 C), temperature source Oral, resp. rate 28, SpO2 100 %.  General: Chronically ill-appearing male, alert, cooperative, NAD. Lethargic on exam, but easily arousable.  HEENT: PERRL, EOMI. Slightly dry mucus membranes.  Neck: Full range of motion without pain, supple, no lymphadenopathy or carotid bruits. No apparent JVD.  Lungs: Tachypneic, mild accessory muscle use, somewhat decreased breath sounds throughout, scattered rales. No rhonchi or wheezes appreciated.  Heart: Tachycardic, irregular, no murmurs, gallops, or rubs appreciated.  Abdomen: Soft, non-tender, non-distended, BS + Extremities: No cyanosis, clubbing. Venous stasis changes on LE's bilaterally, +1 pitting edema over ankles, L>R.  Neurologic: Somewhat lethargic but arousable, oriented x3, cranial nerves II-XII intact, strength grossly intact, sensation intact to light touch.   Lab results: Basic Metabolic Panel:  Recent Labs  08/24/15 0740  NA 146*  K 5.9*  CL 112*  CO2 21*  GLUCOSE 125*  BUN 37*  CREATININE 1.66*  CALCIUM 8.9   CBC:  Recent Labs  08/24/15 0740  WBC 8.6  NEUTROABS 6.0  HGB 14.8  HCT 46.8  MCV 96.7  PLT 132*    Imaging results:  Dg Chest 2 View  08/24/2015  CLINICAL DATA:  Shortness of breath.  Pneumonia. EXAM: CHEST  2 VIEW COMPARISON:  08/13/2015 FINDINGS: Chronic cardiomegaly. Lower lung volumes than when intubated with medial right basilar opacity. Pleural based scarring on the left. There is chronic pleural calcification on the left with small left hemi thorax. Dual-chamber pacer leads from the right. Stable aortic and hilar contours. IMPRESSION: 1. Right basilar atelectasis or pneumonia. 2. Chronic cardiomegaly and left fibrothorax. Electronically Signed   By: Monte Fantasia M.D.   On: 08/24/2015 07:06    Other results: EKG: Atrial Fibrillation with RVR. Right axis deviation.   Assessment & Plan by Problem: 70 y.o. male w/ PMHx of HTN, HLD, CKD stage 3, COPD,  h/o DVT on Eliquis, Tachy-Brady Syndrome s/p PPM placement, refractory atrial fibrillation (previously on Multaq, Tikosyn, Amio), chronic systolic CHF (EF 32%), CAD, anxiety, and depression, admitted for recurrent dyspnea, and atrial fibrillation with RVR.  Dyspnea: Says this has been going on for 2 days, accompanied by mild non-productive cough, dizziness, lightheadedness, palpitations, and diaphoresis. Once again, pretty unclear etiology, however, suspect this is most likely related to his rapid atrial fibrillation. Patient with tachypnea on exam, mild accessory muscle use, scattered rales, no wheezes. Does not appear volume overloaded, no JVD on exam. Cr at baseline. No leukocytosis, no lactic acidosis. CXR appears virtually the same as his recent discharge. No new infiltrate. No fever. Patient has been taking Eliquis for atrial fibrillation and h/o DVT. Says he has been compliant with his medications at his SNF. Given broad spectrum antibiotics in the ED, but do not suspect this  is a bacterial pneumonia given his imaging, lab findings, and clinical exam. No obvious signs of CHF exacerbation given his exam findings, however BNP is elevated to 1500 and patient has a known h/o chronic sCHF with EF of 30%. No clear wheezes on exam, therefore do not suspect COPD exacerbation at this time, although would have low threshold to start steroids. Also possible this is PE, however, patient has been compliant with anticoagulation.  -Admit to stepdown  -Cardizem gtt for rate control for now. Will likely need addition of beta blocker, although do not think his BP would support both at this time.  -Give Digoxin 0.0625 mg (home dose). Doesn't appear he has been taking this. Also check dig level -Check Procalcitonin. Hold further ABx for now -Continue home Pulmicort -Duonebs q4h prn -Low threshold to give Lasix IV or steroids if no improvement with control of HR  Atrial Fibrillation with RVR: Ongoing problem, suspect  this is the cause for his dyspnea currently. Has failed previous anti-arrhythmics such as Multaq, Tikosyn, an amiodarone (had thyroiditis from this). Has been on Toprol-XL 200 in AM, 100 in PM for some time, has been on and off of digoxin, although this was restarted during his most recent admission. Has been recently started on Cardizem CD 120 mg daily for additional rate control despite his low EF per chart review and discussion with EP, Dr. Rayann Heman (according to Dr. Beryle Beams note on recent discharge).  -Cardizem gtt for now -Digoxin as above -Restart BB when BP tolerates -Consider EP/cardiology consult if continued difficulty controlling rate -Continue Eliquis 2.5 mg bid (previously adjusted for Cr)  Hyperkalemia: Unclear etiology for acute increase in K. Initial K was 5.9, repeat was 6.6, therefore unlikely to be pseudohyperkalemia. Cr at baseline, therefore do not suspect this is related to his kidney function currently. Also not significantly acidotic. May be related to high doses of beta blockers (takes total of 300 mg of Toprol-XL at home), vs tissue destruction, however, patient does not complain of severe myalgias, etc.  -Give Insulin, D50, Albuterol, and Kayexalate 30 g once -Consider Lasix as above if indicated -Repeat BMP at 2 PM -Check UA for Hb  -Can consider LDH, CK if suspicion for rhabdo, tissue damage  Chronic Pain: Patient with mild lethargy on exam, wonder if this is somewhat contributed by chronic pain medication use and CKD. Patent currently complaining of mild abdominal discomfort, but otherwise doing okay with his pain.  -Hold opiates for now -Check UDS -Tylenol prn for pain  CAD: Patient with chronic elevation in troponin, slightly elevated today. Had mild dull chest pain initially, although wonder if this is mostly related to his rapid heart rate and dyspnea. EKG with no obvious ischemic changes.  -Continue ASA, Statin (stop if hemoglobinuria)  Depression/Anxiety:  Stable.  -Continue Remeron, Wellbutrin  GERD: Stable -Protonix  DVT/PE PPx: Eliquis  Dispo: Disposition is deferred at this time, awaiting improvement of current medical problems. Anticipated discharge in approximately 1-2 day(s).   The patient does have a current PCP (Zada Finders, MD) and does need an [facility] hospital follow-up appointment after discharge.  The patient does not have transportation limitations that hinder transportation to clinic appointments.  Signed: Corky Sox, MD 08/24/2015, 9:45 AM

## 2015-08-24 NOTE — ED Notes (Signed)
Dr. Alvino Chapel notified of critical potassium. Admitting paged.

## 2015-08-24 NOTE — ED Notes (Signed)
Resident Rhina Brackett attempted ultrasound guided IV.

## 2015-08-24 NOTE — ED Notes (Signed)
Pt has antibiotics running. Will need second line for cardizem. IV team consulted.

## 2015-08-24 NOTE — ED Notes (Signed)
Per EMS, pt c/o SOB beginning approx 1800 this evening, but denied treatment from SNF staff until approx 0300. Received 5mg  albuterol from SNF prior to calling 911. Received 5mg  albuterol, 0.5mg  atrovent from EMS en route. On 4L O2 at home. Dx with pneumonia "couple weeks back".

## 2015-08-24 NOTE — ED Provider Notes (Signed)
CSN: [id number]     Arrival date & time 08/24/15  0543 History   First MD Initiated Contact with Patient 08/24/15 [phone]     Chief Complaint  Patient presents with  . Shortness of Breath    [name] is a 70 y.o. male who presents to the emergency department via EMS and planning of cough and shortness of breath for the past 2 days. Patient initially denied treatment from his skilled nursing facility until just prior to arrival. He has received 2 DuoNeb's prior to arrival. Patient was diagnosed with HCAP 2 weeks ago and required intubation and ICU stay. Patient was recently discharged. Patient complains of shortness of breath without chest pain. No fevers. Patient denies chest pain, abdominal pain, nausea, vomiting, diarrhea or rashes.   The history is provided by the patient and medical records. No language interpreter was used.    Past Medical History  Diagnosis Date  . Hypertension   . Chronic systolic heart failure ([facility] [place])     a. NICM - EF 35% with normal cors 2003. b. Last echo 11/2012 - EF 25-30%.  . Depression   . GERD (gastroesophageal reflux disease)   . Portal vein thrombosis   . Avascular necrosis of bones of both hips ([facility])     Status post bilateral hip replacements  . Gastritis   . Alcohol abuse   . Erectile dysfunction   . Bipolar disorder ([place])   . Hydrocele, unspecified   . Lung nodule     Chest CT scan on 12/14/2008 showed a nodular opacity at the left lung base felt to most likely represent scarring.  Follow-up chest CT scan on 06/02/2010 showed parenchymal scarring in the left apex, left  lower lobe, and lingula; some of this scarring at the left base had a nodular appearance, unchanged. Chest 09/2012 - stable.  . 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  Thyroid scan 08/28/2009 was normal with no focal areas of abnormal increased or decreased activity seen; the uptake of I 131 sodium iodide at 24 hours was 5.7%.  TSH and free  T4 normalized by 08/16/2009.  . Intestinal obstruction ([place])   . COPD (chronic obstructive pulmonary disease) ([place])   . E coli bacteremia   . DVT (deep venous thrombosis) ([place])     He has hypercoagulability with multiple prior DVTs  . Pleural plaque     H/o asbestos exposure. Chest CT on 06/02/2010 showed stable extensive calcified pleural plaques involving the left hemithorax, consistent with asbestos related pleural disease.  . Weight loss     Normal colonoscopy by Dr. Olevia Perches on 11/06/2010.  . Tachycardia-bradycardia syndrome [facility])     a. 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 b. St. Jude gen change 2010.  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  . Noncompliance   . Thrombocytopenia ([place])   . Osteoarthritis   . Spondylosis   . Anxiety   . CKD (chronic kidney disease)     Renal U/S 12/04/2009 showed no pathological findings. Labs 12/04/2009 include normal ESR, C3, C4; neg ANA; SPEP showed nonspecific increase in the alpha-2 region with no M-spike; UPEP showed no monoclonal free light chains; urine IFE showed polyclonal increase in feree Kappa and/or free Lambda light chains. Baseline Cr reported 1.7-2.5.  . STEMI (ST elevation myocardial infarction) ([place]) 10/11/2014  . Hyperlipemia    Past Surgical History  Procedure Laterality Date  . Total hip arthroplasty Right 03/08/2008     By Dr. Hiram Comber  . Tee without cardioversion N/A 05/27/2013    Procedure: TRANSESOPHAGEAL ECHOCARDIOGRAM (TEE);  Surgeon: Candee Furbish, MD;  Location: [facility] ENDOSCOPY;  Service: Cardiovascular;  Laterality: N/A;  . Cardioversion N/A 05/27/2013    Procedure: CARDIOVERSION;  Surgeon: Candee Furbish, MD;  Location: [facility] ENDOSCOPY;  Service: Cardiovascular;  Laterality: N/A;  . Cardiac catheterization  2003    Nl cors, EF 35%  . Embolectomy Right 12/10/2013    Procedure: Right Femoral Embolectomy; Fasciotomy Right Lower Leg  with Intraoperative arteriogram;  Surgeon: Rosetta Posner, MD;  Location: [place];  Service: Vascular;  Laterality: Right;  . Intraoperative arteriogram Right 12/10/2013    Procedure: INTRA OPERATIVE ARTERIOGRAM;  Surgeon: Rosetta Posner, MD;  Location: [place];  Service: Vascular;  Laterality: Right;  . Fasciotomy closure Right 12/15/2013    Procedure: LEG FASCIOTOMY CLOSURE;  Surgeon: Rosetta Posner, MD;  Location: [facility] OR;  Service: Vascular;  Laterality: Right;  . Embolectomy Left 10/12/2014    Procedure: Left Femoral Thrombectomy with intraoperative arteriogram;  Surgeon: Rosetta Posner, MD;  Location: [facility] OR;  Service: Vascular;  Laterality: Left;  . Hemiarthroplasty hip Left 09/09/2002    bipolar; Dr. Lafayette Dragon  . Joint replacement    . Insert / replace / remove pacemaker  02/2003    Dr. Roe Rutherford, [place] pulse generator identity SR model [id number] serial [id number]; gen change by Greggory Brandy  . Insert / replace / remove pacemaker  06/17/2008    Lead and generator change w/ St. Jude Medical Tendril ST model [id number] (serial  [id number]).        Family History  Problem Relation Age of Onset  . Hypertension Mother   . Cancer Brother     Unsure of type.  . Asthma Mother   . Coronary artery disease Mother   . Colon cancer Neg Hx   . Lung cancer Neg Hx   . Prostate cancer Neg Hx   . Stroke      mother  . Heart attack      brother and sister ?age    Social History  Substance Use Topics  . Smoking status: Former Smoker -- 0.33 packs/day for 54 years    Types: Cigarettes    Quit date: 10/11/2014  . Smokeless tobacco: Never Used  . Alcohol Use: No     Comment: 6/22//2016 "h/o etoh abuse; stopped drinking ~ 2012"    Review of Systems  Constitutional: Negative for fever.  HENT: Negative for congestion and sore throat.   Eyes: Negative for visual disturbance.  Respiratory: Positive for cough and shortness of breath.   Cardiovascular: Negative for chest pain.  Gastrointestinal: Negative for  nausea, vomiting, abdominal pain and diarrhea.  Genitourinary: Negative for dysuria.  Musculoskeletal: Negative for back pain and neck pain.  Skin: Negative for rash.  Neurological: Negative for syncope and headaches.      Allergies  Amiodarone and Penicillins  Home Medications   Prior to Admission medications   Medication Sig Start Date End Date Taking? Authorizing Provider  albuterol (VENTOLIN HFA) 108 (90 BASE) MCG/ACT inhaler Inhale 2 puffs into the lungs every 6 (six) hours as needed for wheezing or shortness of breath. 01/06/15  Yes Nischal Dareen Piano, MD  apixaban (ELIQUIS) 2.5 MG TABS tablet Take 1 tablet (2.5 mg total) by mouth 2 (two) times daily. 08/08/15  Yes Zada Finders, MD  aspirin 81  MG chewable tablet Chew 1 tablet (81 mg total) by mouth daily. 11/17/14  Yes Rushil Sherrye Payor, MD  bisacodyl (DULCOLAX) 10 MG suppository Place 1 suppository (10 mg total) rectally daily as needed for severe constipation. 01/06/15  Yes Nischal Narendra, MD  budesonide (PULMICORT) 0.25 MG/2ML nebulizer solution Take 2 mLs (0.25 mg total) by nebulization 2 (two) times daily. 08/08/15  Yes Zada Finders, MD  buPROPion (WELLBUTRIN) 75 MG tablet Take 1 tablet (75 mg total) by mouth 2 (two) times daily. 01/06/15  Yes Aldine Contes, MD  cholecalciferol (VITAMIN D) 1000 units tablet Take 1,000 Units by mouth daily.   Yes Historical Provider, MD  gabapentin (NEURONTIN) 300 MG capsule TAKE ONE CAPSULE BY MOUTH 3 TIMES A DAY 06/19/15  Yes Zada Finders, MD  metoprolol succinate (TOPROL-XL) 100 MG 24 hr tablet Take 100-200 mg by mouth See admin instructions. Take 2 tablets every morning and take 1 tablet every evening   Yes Historical Provider, MD  mirtazapine (REMERON) 15 MG tablet TAKE 1 TABLET BY MOUTH AT BEDTIME 07/25/15  Yes Zada Finders, MD  Multiple Vitamin (MULTIVITAMIN WITH MINERALS) TABS tablet Take 1 tablet by mouth daily.   Yes Historical Provider, MD  Nutritional Supplements (ENSURE CLEAR) LIQD Take 1  each by mouth 3 (three) times daily.   Yes Historical Provider, MD  omeprazole (PRILOSEC) 20 MG capsule Take 20 mg by mouth daily.   Yes Historical Provider, MD  oxyCODONE-acetaminophen (PERCOCET/ROXICET) 5-325 MG tablet Take 1 tablet by mouth every 6 (six) hours as needed for severe pain.   Yes Historical Provider, MD  senna-docusate ([employer] SENNA PLUS) 8.6-50 MG tablet Take 1 tablet by mouth daily as needed for moderate constipation. 08/08/15  Yes Zada Finders, MD  tiotropium (SPIRIVA) 18 MCG inhalation capsule Place 1 capsule (18 mcg total) into inhaler and inhale daily. 01/06/15  Yes Nischal Narendra, MD  Urea 37.5 % CREA Apply 1 application topically daily as needed (for irritation). 08/08/15  Yes Zada Finders, MD  digoxin 62.5 MCG TABS Take 0.0625 mg by mouth daily. Patient not taking: Reported on 08/24/2015 08/18/15   Zada Finders, MD  diltiazem (CARDIZEM CD) 120 MG 24 hr capsule Take 1 capsule (120 mg total) by mouth daily. Patient not taking: Reported on 08/24/2015 08/19/15   Zada Finders, MD  ipratropium (ATROVENT) 0.02 % nebulizer solution Take 2.5 mLs (0.5 mg total) by nebulization 3 (three) times daily. Patient not taking: Reported on 08/24/2015 08/18/15   Zada Finders, MD  levalbuterol Penne Lash) 0.63 MG/3ML nebulizer solution Take 3 mLs (0.63 mg total) by nebulization 3 (three) times daily. Patient not taking: Reported on 08/24/2015 08/18/15   Zada Finders, MD  metoprolol (TOPROL-XL) 200 MG 24 hr tablet Take 2 tablets (200 mg total) in the morning.  Take 1 tablet (100 mg total) in the evening. Take with or immediately following a meal. Patient not taking: Reported on 08/24/2015 08/09/15   Thompson Grayer, MD  Naloxone HCl (EVZIO) 0.4 MG/0.4ML SOAJ Inject 0.4 mLs as directed as needed (opioid overdose). [name] Patient not taking: Reported on 08/24/2015 06/28/15   Lucious Groves, DO  omeprazole (PRILOSEC) 40 MG capsule Take 1 capsule (40 mg total) by mouth daily. Patient not taking: Reported on 08/24/2015  01/06/15   Aldine Contes, MD  oxyCODONE-acetaminophen (PERCOCET) 10-325 MG tablet Take 1 tablet by mouth every 4 (four) hours as needed for pain. Do not take more than 5 doses in a 24 hour period. Patient not taking: Reported on 08/24/2015 08/18/15  Zada Finders, MD  rosuvastatin (CRESTOR) 20 MG tablet Take 1 tablet (20 mg total) by mouth daily at 6 PM. Patient not taking: Reported on 08/24/2015 01/06/15   Nischal Narendra, MD   BP 120/110 mmHg  Pulse 62  Temp(Src) 98.6 F (37 C) (Oral)  Resp 26  SpO2 100% Physical Exam  Constitutional: He is oriented to person, place, and time. He appears well-developed and well-nourished. No distress.  Nontoxic appearing. Patient sleepy but easily aroused with verbal stimuli.  HENT:  Head: Normocephalic and atraumatic.  Mouth/Throat: Oropharynx is clear and moist.  Eyes: Conjunctivae are normal. Pupils are equal, round, and reactive to light. Right eye exhibits no discharge. Left eye exhibits no discharge.  Neck: Neck supple. No JVD present. No tracheal deviation present.  Cardiovascular: Normal heart sounds and intact distal pulses.  Exam reveals no gallop and no friction rub.   No murmur heard. Patient in A. fib RVR on the monitor with a rate of 130. Bilateral radial and posterior tibialis pulses are intact. Good capillary refill.  Pulmonary/Chest: Effort normal. No respiratory distress. He has no wheezes.  Patient with some slight increased work of breathing. Respirations are 26. Lung sounds are diminished to his left base. No wheezing noted.  Abdominal: Soft. Bowel sounds are normal. There is no tenderness. There is no guarding.  Abdomen is soft and nontender to palpation.  Musculoskeletal: He exhibits no edema or tenderness.  No lower extremity edema or tenderness.  Lymphadenopathy:    He has no cervical adenopathy.  Neurological: He is alert and oriented to person, place, and time. Coordination normal.  Patient is sleepy in the room but easily  aroused with verbal stimuli. He is alert and oriented 3.  Skin: Skin is warm and dry. No rash noted. He is not diaphoretic. No erythema. No pallor.  Psychiatric: He has a normal mood and affect. His behavior is normal.  Nursing note and vitals reviewed.   ED Course  Procedures (including critical care time) Labs Review Labs Reviewed  BASIC METABOLIC PANEL - Abnormal; Notable for the following:    Sodium 146 (*)    Potassium 5.9 (*)    Chloride 112 (*)    CO2 21 (*)    Glucose, Bld 125 (*)    BUN 37 (*)    Creatinine, Ser 1.66 (*)    GFR calc non Af Amer 40 (*)    GFR calc Af Amer 46 (*)    All other components within normal limits  CBC WITH DIFFERENTIAL/PLATELET - Abnormal; Notable for the following:    RDW 17.6 (*)    Platelets 132 (*)    All other components within normal limits  BRAIN NATRIURETIC PEPTIDE - Abnormal; Notable for the following:    B Natriuretic Peptide 1529.1 (*)    All other components within normal limits  I-STAT TROPOININ, ED - Abnormal; Notable for the following:    Troponin i, poc 0.11 (*)    All other components within normal limits  POTASSIUM  I-STAT CG4 LACTIC ACID, ED    Imaging Review Dg Chest 2 View  08/24/2015  CLINICAL DATA:  Shortness of breath.  Pneumonia. EXAM: CHEST  2 VIEW COMPARISON:  08/13/2015 FINDINGS: Chronic cardiomegaly. Lower lung volumes than when intubated with medial right basilar opacity. Pleural based scarring on the left. There is chronic pleural calcification on the left with small left hemi thorax. Dual-chamber pacer leads from the right. Stable aortic and hilar contours. IMPRESSION: 1. Right basilar atelectasis or pneumonia.  2. Chronic cardiomegaly and left fibrothorax. Electronically Signed   By: Monte Fantasia M.D.   On: 08/24/2015 07:06   I have personally reviewed and evaluated these images and lab results as part of my medical decision-making.   EKG Interpretation   Date/Time:  Thursday August 24 2015 07:09:01  EDT Ventricular Rate:  137 PR Interval:    QRS Duration: 84 QT Interval:  294 QTC Calculation: 444 R Axis:   96 Text Interpretation:  Atrial fibrillation with rapid ventricular response  Right axis deviation RSR' in V1 or V2, probably normal variant Borderline  repolarization abnormality Confirmed by Alvino Chapel  MD, NATHAN [phone]) on  08/24/2015 7:13:04 AM     Filed Vitals:   08/24/15 0630 08/24/15 0705 08/24/15 0715 08/24/15 0900  BP: 132/95 129/94 93/74 120/110  Pulse: 43 151 107 62  Temp:  98.6 F (37 C)    TempSrc:  Oral    Resp: 35 _0 SpO2: 100% 100% 100% 100%    MDM   Meds given in ED:  Medications  aztreonam (AZACTAM) 2 g in dextrose 5 % 50 mL IVPB (2 g Intravenous New Bag/Given 08/24/15 0854)  vancomycin (VANCOCIN) 1,500 mg in sodium chloride 0.9 % 500 mL IVPB (not administered)  diltiazem (CARDIZEM) 1 mg/mL load via infusion 5 mg (not administered)    And  diltiazem (CARDIZEM) 100 mg in dextrose 5 % 100 mL (1 mg/mL) infusion (not administered)  sodium chloride 0.9 % bolus 500 mL (500 mLs Intravenous New Bag/Given 08/24/15 0854)    New Prescriptions   No medications on file    Final diagnoses:  HCAP (healthcare-associated pneumonia)  Atrial fibrillation with RVR ([place])   This is a 70 y.o. male who presents to the emergency department via EMS and planning of cough and shortness of breath for the past 2 days. Patient initially denied treatment from his skilled nursing facility until just prior to arrival. He has received 2 DuoNeb's prior to arrival. Patient was diagnosed with HCAP 2 weeks ago and required intubation and ICU stay. On exam the patient is nontoxic-appearing. He is sleepy but easily aroused with verbal stimuli. He is in A. fib RVR on the monitor with a heart rate of 130. The stylet blood pressure is 120. He is on 2 and a half liters via nasal cannula with an oxygen saturation of 100%. He does have some slight increased work of breathing and diminished  lung sounds to his left base. Patient has a normal lactic acid. Troponin is mildly elevated at 0.11. Patient appears to have chronic elevations of his troponin. BNP is from over hypernatremia with a sodium of 146. He is hyperkalemic with a potassium of 5.9. Will recheck potassium. Creatinine is around his baseline of 1.66. No white count on CBC. BNP is elevated at 1500. Chest x-ray shows a right pneumonia. We'll start on H Antibiotics as well as Cardizem 5 mg bolus with 5 mg per hour drip. Will consult for admission.   I consulted with internal medicine teaching service who accepted the patient for admission with Dr. Lynnae January as attending physician. Step down temporary admission orders placed.   This patient was discussed with and evaluated by Dr. Alvino Chapel who agrees with assessment and plan.     Waynetta Pean, PA-C 08/24/15 [place], MD 08/25/15 [phone]

## 2015-08-25 LAB — CBC
HCT: 36.7 % — ABNORMAL LOW (ref 39.0–52.0)
Hemoglobin: 11.5 g/dL — ABNORMAL LOW (ref 13.0–17.0)
MCH: 30 pg (ref 26.0–34.0)
MCHC: 31.3 g/dL (ref 30.0–36.0)
MCV: 95.8 fL (ref 78.0–100.0)
PLATELETS: 127 10*3/uL — AB (ref 150–400)
RBC: 3.83 MIL/uL — ABNORMAL LOW (ref 4.22–5.81)
RDW: 17.5 % — ABNORMAL HIGH (ref 11.5–15.5)
WBC: 7.6 10*3/uL (ref 4.0–10.5)

## 2015-08-25 LAB — BASIC METABOLIC PANEL
ANION GAP: 8 (ref 5–15)
BUN: 36 mg/dL — ABNORMAL HIGH (ref 6–20)
CALCIUM: 7.9 mg/dL — AB (ref 8.9–10.3)
CHLORIDE: 117 mmol/L — AB (ref 101–111)
CO2: 20 mmol/L — AB (ref 22–32)
CREATININE: 1.69 mg/dL — AB (ref 0.61–1.24)
GFR calc Af Amer: 45 mL/min — ABNORMAL LOW (ref >60)
GFR calc non Af Amer: 39 mL/min — ABNORMAL LOW (ref >60)
Glucose, Bld: 106 mg/dL — ABNORMAL HIGH (ref 65–99)
Potassium: 4.5 mmol/L (ref 3.5–5.1)
Sodium: 145 mmol/L (ref 135–145)

## 2015-08-25 MED ORDER — GABAPENTIN 300 MG PO CAPS
300.0000 mg | ORAL_CAPSULE | Freq: Three times a day (TID) | ORAL | Status: DC
  Administered 2015-08-25: 300 mg via ORAL
  Filled 2015-08-25 (×2): qty 1

## 2015-08-25 MED ORDER — METOPROLOL SUCCINATE ER 100 MG PO TB24
100.0000 mg | ORAL_TABLET | Freq: Every day | ORAL | Status: DC
  Administered 2015-08-25 – 2015-08-26 (×2): 100 mg via ORAL
  Filled 2015-08-25 (×2): qty 1

## 2015-08-25 MED ORDER — OXYCODONE-ACETAMINOPHEN 5-325 MG PO TABS: 1.0000 | ORAL_TABLET | Freq: Four times a day (QID) | ORAL | Status: DC | PRN

## 2015-08-25 MED ORDER — CETYLPYRIDINIUM CHLORIDE 0.05 % MT LIQD
7.0000 mL | Freq: Two times a day (BID) | OROMUCOSAL | Status: DC
  Administered 2015-08-25 – 2015-08-26 (×4): 7 mL via OROMUCOSAL

## 2015-08-25 MED ORDER — FLEET ENEMA 7-19 GM/118ML RE ENEM
1.0000 | ENEMA | Freq: Once | RECTAL | Status: AC
  Administered 2015-08-25: 1 via RECTAL
  Filled 2015-08-25: qty 1

## 2015-08-25 MED ORDER — METOPROLOL SUCCINATE ER 100 MG PO TB24
200.0000 mg | ORAL_TABLET | Freq: Every day | ORAL | Status: DC
  Administered 2015-08-25 – 2015-08-27 (×3): 200 mg via ORAL
  Filled 2015-08-25 (×3): qty 2

## 2015-08-25 MED ORDER — DILTIAZEM HCL ER COATED BEADS 120 MG PO CP24
120.0000 mg | ORAL_CAPSULE | Freq: Every day | ORAL | Status: DC
  Administered 2015-08-25 – 2015-08-27 (×3): 120 mg via ORAL
  Filled 2015-08-25 (×3): qty 1

## 2015-08-25 NOTE — Progress Notes (Signed)
Transfer Note:  Patient alert and oriented X 4 and in no distress. VSS.  Patient transported vial wheelchair to 2W09.

## 2015-08-25 NOTE — Progress Notes (Signed)
Patient to transfer to 2w09.  Report called to [facility]

## 2015-08-25 NOTE — Discharge Summary (Signed)
Name: [name] MRN: [id number] DOB: 01/10/1946 70 y.o. PCP: Zada Finders, MD  Date of Admission: 08/24/2015  5:43 AM Date of Discharge: 08/27/2015 Attending Physician: [name], MD  Discharge Diagnosis: Principal Problem AFib with RVR Active Problems Chronic Pain CAD Depression with Anxiety GERD     Discharge Medications:   Medication List    TAKE these medications        albuterol 108 (90 Base) MCG/ACT inhaler  Commonly known as:  VENTOLIN HFA  Inhale 2 puffs into the lungs every 6 (six) hours as needed for wheezing or shortness of breath.     apixaban 2.5 MG Tabs tablet  Commonly known as:  ELIQUIS  Take 1 tablet (2.5 mg total) by mouth 2 (two) times daily.     aspirin 81 MG chewable tablet  Chew 1 tablet (81 mg total) by mouth daily.     bisacodyl 10 MG suppository  Commonly known as:  DULCOLAX  Place 1 suppository (10 mg total) rectally daily as needed for severe constipation.     budesonide 0.25 MG/2ML nebulizer solution  Commonly known as:  PULMICORT  Take 2 mLs (0.25 mg total) by nebulization 2 (two) times daily.     buPROPion 75 MG tablet  Commonly known as:  WELLBUTRIN  Take 1 tablet (75 mg total) by mouth 2 (two) times daily.     cholecalciferol 1000 units tablet  Commonly known as:  VITAMIN D  Take 1,000 Units by mouth daily.     Digoxin 62.5 MCG Tabs  Take 0.0625 mg by mouth daily.     diltiazem 120 MG 24 hr capsule  Commonly known as:  CARDIZEM CD  Take 1 capsule (120 mg total) by mouth daily.     ENSURE CLEAR Liqd  Take 1 each by mouth 3 (three) times daily.     gabapentin 100 MG capsule  Commonly known as:  NEURONTIN  Take 3 capsules (300 mg total) by mouth 3 (three) times daily.     ipratropium 0.02 % nebulizer solution  Commonly known as:  ATROVENT  Take 2.5 mLs (0.5 mg total) by nebulization 3 (three) times daily.     levalbuterol 0.63 MG/3ML nebulizer solution  Commonly known as:  XOPENEX  Take 3 mLs (0.63 mg  total) by nebulization 3 (three) times daily.     metoprolol succinate 100 MG 24 hr tablet  Commonly known as:  TOPROL-XL  Take 100-200 mg by mouth See admin instructions. Take 2 tablets every morning and take 1 tablet every evening     metoprolol 200 MG 24 hr tablet  Commonly known as:  TOPROL-XL  Take 2 tablets (200 mg total) in the morning.  Take 1 tablet (100 mg total) in the evening. Take with or immediately following a meal.     mirtazapine 15 MG tablet  Commonly known as:  REMERON  TAKE 1 TABLET BY MOUTH AT BEDTIME     multivitamin with minerals Tabs tablet  Take 1 tablet by mouth daily.     Naloxone HCl 0.4 MG/0.4ML Soaj  Commonly known as:  EVZIO  Inject 0.4 mLs as directed as needed (opioid overdose). For [name]     omeprazole 40 MG capsule  Commonly known as:  PRILOSEC  Take 1 capsule (40 mg total) by mouth daily.     oxyCODONE-acetaminophen 5-325 MG tablet  Commonly known as:  PERCOCET/ROXICET  Take 1 tablet by mouth every 6 (six) hours as needed for severe pain.  rosuvastatin 20 MG tablet  Commonly known as:  CRESTOR  Take 1 tablet (20 mg total) by mouth daily at 6 PM.     senna-docusate 8.6-50 MG tablet  Commonly known as:  [employer] SENNA PLUS  Take 1 tablet by mouth daily as needed for moderate constipation.     tiotropium 18 MCG inhalation capsule  Commonly known as:  SPIRIVA  Place 1 capsule (18 mcg total) into inhaler and inhale daily.     Urea 37.5 % Crea  Apply 1 application topically daily as needed (for irritation).        Disposition and follow-up:   [name] was discharged from [facility] in Good condition.  At the hospital follow up visit please address:  1.  AFib- Is HR controlled? Should be on Digoxin, Diltiazem, and Toprol-XL. Will have f/u with Dr. Rayann Heman on 4/17 Chronic Pain- Pt stated he is not taking Gabapentin at all. I weaned dose down to 100 mg TID, determine if he needs to take at all. Also states he  was not taking Percocet and UDS neg for opioids, determine if dose/frequency needs to be adjusted.   2.  Labs / imaging needed at time of follow-up: bmet- check K  3.  Pending labs/ test needing follow-up: None  Follow-up Appointments: Follow-up Information    Follow up with Michail Jewels, MD On 08/30/2015.   Specialty:  Internal Medicine   Why:  3:45   Contact information:   [place] [address] [phone]       Follow up with Thompson Grayer, MD On 09/04/2015.   Specialty:  Cardiology   Why:  2:45 PM   Contact information:   [address] [place] [address] [phone]       Discharge Instructions: Discharge Instructions    Diet - low sodium heart healthy    Complete by:  As directed      Increase activity slowly    Complete by:  As directed            Consultations:    Procedures Performed:  Dg Chest 2 View  08/24/2015  CLINICAL DATA:  Shortness of breath.  Pneumonia. EXAM: CHEST  2 VIEW COMPARISON:  08/13/2015 FINDINGS: Chronic cardiomegaly. Lower lung volumes than when intubated with medial right basilar opacity. Pleural based scarring on the left. There is chronic pleural calcification on the left with small left hemi thorax. Dual-chamber pacer leads from the right. Stable aortic and hilar contours. IMPRESSION: 1. Right basilar atelectasis or pneumonia. 2. Chronic cardiomegaly and left fibrothorax. Electronically Signed   By: Monte Fantasia M.D.   On: 08/24/2015 07:06   Dg Chest 2 View  07/28/2015  CLINICAL DATA:  Shortness of breath, productive cough, tachycardia, chest pain, weakness, dizziness EXAM: CHEST  2 VIEW COMPARISON:  06/16/2015 FINDINGS: Mild patchy right lower lobe opacity, suspicious for pneumonia, less likely atelectasis. Associated small right pleural effusion. Stable scarring in the left mid lung and left lung base. Volume loss in the left hemithorax. Small left pleural effusion with apical capping. No pneumothorax. The  heart is top-normal in size.  Right subclavian pacemaker. IMPRESSION: Mild patchy right lower lobe opacity, suspicious for pneumonia, less likely atelectasis. Associated small bilateral pleural effusions. Chronic left lung scarring with volume loss. Electronically Signed   By: Julian Hy M.D.   On: 07/28/2015 20:00   Dg Chest Port 1 View  08/13/2015  CLINICAL DATA:  Acute respiratory failure  EXAM: PORTABLE CHEST 1 VIEW COMPARISON:  08/12/2015 FINDINGS: Cardiomediastinal silhouette is stable. Endotracheal tube in place with tip 4.1 cm above the carina. Stable NG tube position. Stable right IJ central line position. Again noted scarring and pleural thickening in left midlung and left upper lobe. Probable small left pleural effusion with left basilar atelectasis or infiltrate. No pulmonary edema. Right lung is clear. Dual lead cardiac pacemaker is unchanged in position. IMPRESSION: Stable support apparatus. Again noted scarring and pleural thickening in left midlung and left upper lobe. Probable small left pleural effusion with left basilar atelectasis or infiltrate. No pulmonary edema. Right lung is clear. Electronically Signed   By: Lahoma Crocker M.D.   On: 08/13/2015 10:04   Dg Chest Port 1 View  08/12/2015  CLINICAL DATA:  Central line placement EXAM: PORTABLE CHEST 1 VIEW COMPARISON:  Earlier same day; 08/04/2015; 06/16/2015 FINDINGS: Interval placement of right jugular approach central venous catheter with tip projected over the superior cavoatrial junction. Stable position remaining support apparatus. No pneumothorax. Grossly unchanged cardiac silhouette and mediastinal contours with atherosclerotic plaque within the thoracic aorta. Pulmonary vasculature remains indistinct with cephalization of flow. Unchanged small left-sided effusion with associated bibasilar heterogeneous/consolidative opacities, left greater than right. Linear heterogeneous opacities with the peripheral aspect the left mid lung are  unchanged in favored to represent atelectasis or scar. Unchanged bones. IMPRESSION: 1. No right jugular approach intravenous catheter tip projects over the superior cavoatrial junction. Otherwise, stable position of support apparatus. No pneumothorax. 2. Similar findings most suggestive pulmonary edema superimposed on advanced emphysematous change and background parenchymal abnormalities favored to represent atelectasis or scar. 3. Unchanged small left-sided effusion with associated bibasilar opacities, left greater than right, atelectasis versus infiltrate. Electronically Signed   By: Sandi Mariscal M.D.   On: 08/12/2015 08:09   Dg Chest Portable 1 View  08/12/2015  CLINICAL DATA:  70 year old male with endotracheal tube placement. EXAM: PORTABLE CHEST 1 VIEW COMPARISON:  Chest radiograph dated 08/12/2015 FINDINGS: There has been interval placement of and endotracheal tube the tip approximately 2 cm above the carina. Recommend retraction and repositioning of the endotracheal tube by 3- 4 cm for optimal positioning. An enteric tube is partially visualized coursing towards the left upper abdomen with tip beyond the image margin. Focal area of consolidation is again noted in the left mid lung field. Left apical pleural thickening appears stable. There is stable moderate left pleural effusion. No pneumothorax. Stable cardiac silhouette. Right pectoral pacemaker device. No acute osseous pathology. IMPRESSION: Interval placement of an endotracheal tube with tip approximately 2 cm above the carina. Recommend retraction by approximately 3-4 cm for optimal positioning. Left mid lung field opacity and left pleural effusion appears similar to prior study. Follow-up recommended to resolution. Electronically Signed   By: Anner Crete M.D.   On: 08/12/2015 06:56   Dg Chest Port 1 View  08/12/2015  CLINICAL DATA:  Shortness of breath, weakness, recent pneumonia. History of hypertension, COPD, lung nodule. EXAM: PORTABLE  CHEST 1 VIEW COMPARISON:  Chest radiograph August 04, 2015 and June 16, 2015 FINDINGS: Similar to worsening LEFT midlung zone consolidation, with stable LEFT apical pleural thickening. Moderate suspected LEFT pleural effusion associated with known elevated hemidiaphragm. Strandy densities RIGHT lung base. Cardiac silhouette is upper limits of normal in size, mediastinal silhouette is nonsuspicious, calcified aortic knob. Dual lead RIGHT cardiac pacemaker in situ. Interval removal of nasogastric tube. Osseous structures are nonsuspicious. IMPRESSION: Similar to worsening LEFT midlung zone consolidation with small LEFT pleural effusion,  stable LEFT apical pleural thickening. Followup PA and lateral chest X-ray is recommended in 3-4 weeks following trial of antibiotic therapy to ensure resolution and exclude underlying malignancy. Mild cardiomegaly.  RIGHT lung base atelectasis. Electronically Signed   By: Elon Alas M.D.   On: 08/12/2015 05:04   Dg Chest Port 1 View  08/04/2015  CLINICAL DATA:  Persistent shortness of breath. EXAM: PORTABLE CHEST 1 VIEW COMPARISON:  07/29/2015 FINDINGS: The heart is mildly enlarged but stable. There is tortuosity and calcification of the thoracic aorta. Stable extensive scarring changes involving the left lung and chronic pleural thickening. The right lung remains clear. There is an NG tube coursing down the esophagus and into the stomach. The pacer wires are stable. IMPRESSION: Chronic left lung scarring changes and chronic pleural thickening. No definite acute overlying pulmonary process. NG tube in good position. Electronically Signed   By: Marijo Sanes M.D.   On: 08/04/2015 08:25   Dg Chest Port 1 View  07/29/2015  CLINICAL DATA:  Shortness of breath EXAM: PORTABLE CHEST 1 VIEW COMPARISON:  July 28, 2015 FINDINGS: Layering effusion on the right is identified, unchanged. Underlying opacity is seen. New mild opacity in the right mid lung. Chronic changes again seen  in the left lung. Mild increasing density seen in the left mid lung as well. Pleural capping again seen on the left. The cardiomediastinal silhouette is stable. IMPRESSION: Increasing mid lung opacities bilaterally. Stable layering effusion on the right. Recommend follow-up to resolution. Electronically Signed   By: Dorise Bullion III M.D   On: 07/29/2015 15:03   Dg Abd Portable 1v  08/04/2015  CLINICAL DATA:  Ileus.  NG tube placement. EXAM: PORTABLE ABDOMEN - 1 VIEW COMPARISON:  08/02/2015 FINDINGS: An enteric tube has been placed with tip projecting just right of midline in the upper abdomen, likely in the distal stomach. The stomach has been decompressed. Gas-filled, mildly dilated loops of small and large bowel remain throughout the abdomen, minimally improved from prior. Limited assessment for free air on this supine study. Bilateral hip arthroplasties are partially visualized. IMPRESSION: 1. Enteric tube terminates over the distal stomach. 2. Diffuse gaseous distention of small and large bowel compatible with ileus and minimally improved from prior. Electronically Signed   By: Logan Bores M.D.   On: 08/04/2015 08:17   Dg Abd Portable 1v  08/02/2015  CLINICAL DATA:  Mid abdominal pain EXAM: PORTABLE ABDOMEN - 1 VIEW COMPARISON:  10/13/2014 abdominal CT FINDINGS: Diffuse predominately gas dilated bowel from the stomach to the rectum. No gross pneumoperitoneum but limited by supine study with multiple closely positioned bowel loops. No concerning intra-abdominal mass effect or calcification. Degenerative disc disease and bilateral hip arthroplasty. IMPRESSION: Diffuse gas dilated bowel favoring adynamic ileus over low colonic obstruction. The stomach is prominently distended. Electronically Signed   By: Monte Fantasia M.D.   On: 08/02/2015 23:33    Admission HPI: [name] is a 71 y.o. male w/ PMHx of HTN, HLD, CKD stage 3, COPD, h/o DVT on Eliquis, Tachy-Brady Syndrome s/p PPM placement,  refractory atrial fibrillation (previously on Multaq, Tikosyn, Amio), chronic systolic CHF (EF [id number]), CAD, anxiety, and depression, presents to the ED from his skilled nursing facility with worsening dyspnea. Patient says this has been going on for 2 days, accompanied by a mild, non-productive cough, diaphoresis, mild abdominal pain, and mild dull chest discomfort. No nausea, or vomiting, no reported fevers, chills. Also notes dizziness, lightheadedness, and palpitations.   Patient was recently  admitted from 3/11 - 3/21 for rapid atrial fibrillation, hyperkalemia, and ileus, then again from 3/25 - 3/31 for dyspnea, thought to have sepsis 2/2 HCAP initially, as well as rapid atrial fibrillation again, required mechanical ventilation. CXR with chronic appearing findings. Patient extubated on 3/28. Procalcitonin x3 during that admission were 0. Patient's rate was controlled with both Cardizem + Metoprolol + Digoxin. Also treated with course of steroids for suspected COPD as well.   In the ED today, patient with normal white count, normal/mildly low BP, quite tachycardic to the 140-150's, no fever. SpO2 100% on 2L via [place], although patient is mildly tachypneic, using some accessory muscles. CXR appears similar to previous as seen on recent discharge. Cr at baseline, no lactic acidosis. Patient treated with Vancomycin + Aztreonam for HCAP coverage, given nebulizer for SOB. BNP elevated to 1529, chronic mild baseline elevation of troponin (0.11), K 5.9 initially, repeat 6.6.   Hospital Course by problem list:  AFib with RVR: Patient presented with dyspnea and Afib with RVR requiring Diltiazem drip for rate control.  He was rate controlled overnight and his home medications were resumed the next day (Metoprolol, Diltiazem, and Digoxin).  Follow up with his Cardiologist was arranged for consideration of Maze procedure vs evaluation at an academic center.    Discharge Vitals:   BP 110/85 mmHg  Pulse 69  Temp(Src)  98.2 F (36.8 C) (Oral)  Resp 20  Ht 6\' 3"  (1.905 m)  Wt 179 lb 0.2 oz (81.2 kg)  BMI 22.38 kg/m2  SpO2 99% Physical Exam  Constitutional: He is oriented to person, place, and time and well-developed, well-nourished, and in no distress. No distress.  HENT:  Head: Normocephalic and atraumatic.  Eyes: EOM are normal. No scleral icterus.  Neck: No tracheal deviation present.  Cardiovascular: Normal rate and normal heart sounds.  Irregularly irregular  Pulmonary/Chest: Effort normal and breath sounds normal. No stridor. No respiratory distress. He has no wheezes.  No crackles appreciated.  Abdominal: Soft. He exhibits no distension. There is no tenderness. There is no rebound and no guarding.  Neurological: He is alert and oriented to person, place, and time.  Skin: Skin is warm and dry. He is not diaphoretic.   Signed: Juliet Rude, MD 08/27/2015, 10:14 AM    Services Ordered on Discharge: None Equipment Ordered on Discharge: 3 n 1

## 2015-08-25 NOTE — Progress Notes (Signed)
Subjective: NAEON.  Patient rate controlled on Dilt gtt.  He denies chest pain, palpitations, or SOB.  His only concern is feeling tired from people constantly entering the room.  Objective: Vital signs in last 24 hours: Filed Vitals:   08/25/15 0434 08/25/15 0442 08/25/15 0833 08/25/15 0900  BP:    134/94  Pulse:      Temp:  98.5 F (36.9 C)  98.6 F (37 C)  TempSrc:  Oral  Oral  Resp:    18  Height:      Weight: 182 lb 1.6 oz (82.6 kg)     SpO2:   100% 97%   Weight change:   Intake/Output Summary (Last 24 hours) at 08/25/15 1027 Last data filed at 08/25/15 0511  Gross per 24 hour  Intake 135.75 ml  Output    150 ml  Net -14.25 ml   Physical Exam  Constitutional: He is oriented to person, place, and time and well-developed, well-nourished, and in no distress. No distress.  HENT:  Head: Normocephalic and atraumatic.  Eyes: EOM are normal. No scleral icterus.  Neck: No tracheal deviation present.  Cardiovascular: Normal rate and normal heart sounds.   Irregularly irregular  Pulmonary/Chest: Effort normal and breath sounds normal. No stridor. No respiratory distress. He has no wheezes.  No crackles appreciated.  Abdominal: Soft. He exhibits no distension. There is no tenderness. There is no rebound and no guarding.  Musculoskeletal:  Trace edema to midshin while seated.  Neurological: He is alert and oriented to person, place, and time.  Skin: Skin is warm and dry. He is not diaphoretic.    Lab Results: Basic Metabolic Panel:  Recent Labs Lab 08/24/15 1538 08/25/15 0433  NA 149* 145  K 4.8 4.5  CL 116* 117*  CO2 21* 20*  GLUCOSE 150* 106*  BUN 38* 36*  CREATININE 1.56* 1.69*  CALCIUM 8.3* 7.9*   Liver Function Tests: No results for input(s): AST, ALT, ALKPHOS, BILITOT, PROT, ALBUMIN in the last 168 hours. No results for input(s): LIPASE, AMYLASE in the last 168 hours. No results for input(s): AMMONIA in the last 168 hours. CBC:  Recent Labs Lab  08/24/15 0740 08/25/15 0433  WBC 8.6 7.6  NEUTROABS 6.0  --   HGB 14.8 11.5*  HCT 46.8 36.7*  MCV 96.7 95.8  PLT 132* 127*   Cardiac Enzymes: No results for input(s): CKTOTAL, CKMB, CKMBINDEX, TROPONINI in the last 168 hours. BNP: No results for input(s): PROBNP in the last 168 hours. D-Dimer: No results for input(s): DDIMER in the last 168 hours. CBG: No results for input(s): GLUCAP in the last 168 hours. Hemoglobin A1C: No results for input(s): HGBA1C in the last 168 hours. Fasting Lipid Panel: No results for input(s): CHOL, HDL, LDLCALC, TRIG, CHOLHDL, LDLDIRECT in the last 168 hours. Thyroid Function Tests: No results for input(s): TSH, T4TOTAL, FREET4, T3FREE, THYROIDAB in the last 168 hours. Coagulation: No results for input(s): LABPROT, INR in the last 168 hours. Anemia Panel: No results for input(s): VITAMINB12, FOLATE, FERRITIN, TIBC, IRON, RETICCTPCT in the last 168 hours. Urine Drug Screen: Drugs of Abuse     Component Value Date/Time   LABOPIA NONE DETECTED 08/24/2015 1240   LABOPIA NEGATIVE 12/27/2008 1943   COCAINSCRNUR NONE DETECTED 08/24/2015 1240   COCAINSCRNUR NEG 07/06/2009 2225   LABBENZ NONE DETECTED 08/24/2015 1240   LABBENZ NEG 07/06/2009 2225   AMPHETMU NONE DETECTED 08/24/2015 1240   AMPHETMU NEG 07/06/2009 2225   THCU NONE DETECTED 08/24/2015 1240  LABBARB NONE DETECTED 08/24/2015 1240    Alcohol Level: No results for input(s): ETH in the last 168 hours. Urinalysis:  Recent Labs Lab 08/24/15 1240  COLORURINE YELLOW  LABSPEC 1.020  PHURINE 5.5  GLUCOSEU NEGATIVE  HGBUR NEGATIVE  BILIRUBINUR NEGATIVE  KETONESUR NEGATIVE  PROTEINUR 100*  NITRITE NEGATIVE  LEUKOCYTESUR NEGATIVE   Misc. Labs:   Micro Results: Recent Results (from the past 240 hour(s))  MRSA PCR Screening     Status: None   Collection Time: 08/24/15  5:43 PM  Result Value Ref Range Status   MRSA by PCR NEGATIVE NEGATIVE Final    Comment:        The GeneXpert  MRSA Assay (FDA approved for NASAL specimens only), is one component of a comprehensive MRSA colonization surveillance program. It is not intended to diagnose MRSA infection nor to guide or monitor treatment for MRSA infections.    Studies/Results: Dg Chest 2 View  08/24/2015  CLINICAL DATA:  Shortness of breath.  Pneumonia. EXAM: CHEST  2 VIEW COMPARISON:  08/13/2015 FINDINGS: Chronic cardiomegaly. Lower lung volumes than when intubated with medial right basilar opacity. Pleural based scarring on the left. There is chronic pleural calcification on the left with small left hemi thorax. Dual-chamber pacer leads from the right. Stable aortic and hilar contours. IMPRESSION: 1. Right basilar atelectasis or pneumonia. 2. Chronic cardiomegaly and left fibrothorax. Electronically Signed   By: Monte Fantasia M.D.   On: 08/24/2015 07:06   Medications: I have reviewed the patient's current medications. Scheduled Meds: . antiseptic oral rinse  7 mL Mouth Rinse BID  . apixaban  2.5 mg Oral BID  . aspirin  81 mg Oral Daily  . budesonide  0.25 mg Nebulization BID  . buPROPion  75 mg Oral BID  . digoxin  0.0625 mg Oral Daily  . diltiazem  120 mg Oral Daily  . feeding supplement (ENSURE ENLIVE)  1 Bottle Oral TID  . metoprolol succinate  100 mg Oral Q2000  . metoprolol  200 mg Oral Daily  . mirtazapine  15 mg Oral QHS  . pantoprazole  40 mg Oral Daily  . sodium chloride flush  3 mL Intravenous Q12H   Continuous Infusions:  PRN Meds:.acetaminophen, ipratropium-albuterol Assessment/Plan: Active Problems:   HCAP (healthcare-associated pneumonia)   Dyspnea  70 y.o. male w/ PMHx of HTN, HLD, CKD stage 3, COPD, h/o DVT on Eliquis, Tachy-Brady Syndrome s/p PPM placement, refractory atrial fibrillation (previously on Multaq, Tikosyn, Amio), chronic systolic CHF (EF [id number]), CAD, anxiety, and depression, admitted for recurrent dyspnea, and atrial fibrillation with RVR.  Atrial Fibrillation with RVR:  Failed previous anti-arrhythmics such as Multaq, Tikosyn, an amiodarone (had thyroiditis from this). Has been on Toprol-XL 200 in AM, 100 in PM for some time, has been on and off of digoxin, although this was restarted during his most recent admission. Has been recently started on Cardizem CD 120 mg daily for additional rate control despite his low EF per chart review and discussion with EP, Dr. Rayann Heman (according to Dr. Beryle Beams note on recent discharge). Rate controlled overnight with Dilt gtt, so will restart home medications and wean drip.  If rate controlled this afternoon, can send him home with PCP and Cardiology f/u.   - Wean diltiazem gtt - Digoxin 0.0625 mg daily - Diltiazem 120 mg daily - Metoprolol tartrate 200 mg qAM and 100 mg qPM - Continue Eliquis 2.5 mg bid (previously adjusted for Cr) - Duonebs q4h PRN - F/u with Dr. Rayann Heman for  possible Maze procedure  Hyperkalemia, improved: s/p Kayexalate. Unclear etiology for acute increase in K. May be related to high doses of beta blockers (takes total of 300 mg of Toprol-XL at home). Will recheck K at PCP f/u - CTM  Chronic Pain: Patient alert today. Will restart home pain medications while we monitor HR.  However, his UDS was negative for opiates, so it does not appear he is chronically using. - Gabapentin 300 mg TID - Percocet 5mg  q6h PRN pain -Check UDS -Tylenol prn for pain  CAD: Patient with chronic elevation in troponin, slightly elevated today. Had mild dull chest pain initially, although wonder if this is mostly related to his rapid heart rate and dyspnea. EKG with no obvious ischemic changes.  -Continue ASA, Statin  Depression/Anxiety: Stable.  -Continue Remeron, Wellbutrin  GERD: Stable -Protonix  DVT/PE PPx: Eliquis  Dispo: Disposition is deferred at this time, awaiting improvement of current medical problems.  Anticipated discharge in approximately 1-2 day(s).   The patient does have a current PCP (Zada Finders,  MD) and does not need an [facility] hospital follow-up appointment after discharge.  The patient does not have transportation limitations that hinder transportation to clinic appointments.  .Services Needed at time of discharge: Y = Yes, Blank = No PT:   OT:   RN:   Equipment:   Other:     LOS: 1 day   Iline Oven, MD 08/25/2015, 10:27 AM

## 2015-08-25 NOTE — Evaluation (Signed)
Physical Therapy Evaluation Patient Details Name: [name] MRN: [id number] DOB: 07/17/1945 Today's Date: 08/25/2015   History of Present Illness  70 y.o. male admitted with sepsis, afib with RVR, COPD Exacerbation, systolic CHF, Pneumonia, and Acute on CKD.  Clinical Impression  Pt admitted with above diagnosis. Pt currently with functional limitations due to the deficits listed below (see PT Problem List). Pt is steady with use of RW but is very self limiting with mobility. Pt states that he does not walk very far at baseline and refused to do anymore with therapy. Pt able to tolerate activity with SaO2 >95% on room air. Pt will benefit from skilled PT to increase their independence and safety with mobility to allow discharge to the venue listed below. Pt does not have assistance during the day at home and wishes to go back to SNF.      Follow Up Recommendations SNF;Supervision for mobility/OOB    Equipment Recommendations  None recommended by PT    Recommendations for Other Services       Precautions / Restrictions Precautions Precautions: Fall Restrictions Weight Bearing Restrictions: No      Mobility  Bed Mobility Overal bed mobility: Needs Assistance Bed Mobility: Supine to Sit     Supine to sit: Supervision;HOB elevated     General bed mobility comments: Required extra time, use of rail and HOB elevated. Supervision for safety.  Transfers Overall transfer level: Needs assistance Equipment used: Rolling walker (2 wheeled) Transfers: Sit to/from Stand Sit to Stand: Min guard;From elevated surface         General transfer comment: MinGuard for safety from elevated surface. Needs MinA from lower surfaces. Cues for hand placement on walker.   Ambulation/Gait Ambulation/Gait assistance: Min guard Ambulation Distance (Feet): 10 Feet Assistive device: Rolling walker (2 wheeled) Gait Pattern/deviations: Step-through pattern;Decreased stride length;Trunk  flexed;Narrow base of support Gait velocity: decreased   General Gait Details: Pt stable with RW. Pt refused to walk any further than around the bed to the chair as he wanted to eat his breakfast before doing anything "crazy".   Stairs            Wheelchair Mobility    Modified Rankin (Stroke Patients Only)       Balance Overall balance assessment: Needs assistance Sitting-balance support: No upper extremity supported Sitting balance-Leahy Scale: Good     Standing balance support: Bilateral upper extremity supported Standing balance-Leahy Scale: Fair Standing balance comment: Steady with UE support on RW.                              Pertinent Vitals/Pain Pain Assessment: 0-10 Pain Score: 7  Pain Location: stomach area Pain Descriptors / Indicators: Constant;Discomfort Pain Intervention(s): Monitored during session;Repositioned    Home Living Family/patient expects to be discharged to:: Skilled nursing facility Living Arrangements: Spouse/significant other Available Help at Discharge: Family;Available PRN/intermittently Type of Home: House Home Access: Level entry Entrance Stairs-Rails: None Entrance Stairs-Number of Steps: 1 Home Layout: One level Home Equipment: Walker - 2 wheels;Cane - single point;Bedside commode      Prior Function Level of Independence: Needs assistance   Gait / Transfers Assistance Needed: Walked short distances with RW  ADL's / Homemaking Assistance Needed: Needed assist with dressing        Hand Dominance   Dominant Hand: Right    Extremity/Trunk Assessment   Upper Extremity Assessment: Generalized weakness  Lower Extremity Assessment: Generalized weakness      Cervical / Trunk Assessment: Kyphotic  Communication   Communication: No difficulties  Cognition Arousal/Alertness: Awake/alert Behavior During Therapy: WFL for tasks assessed/performed Overall Cognitive Status: Within Functional  Limits for tasks assessed                      General Comments General comments (skin integrity, edema, etc.): Pt somewhat cooperative with therapy. Pt did only wanted to do the "minimum" amount of activity that he could. Pt declined walking any further and doing exercises.     Exercises        Assessment/Plan    PT Assessment Patient needs continued PT services  PT Diagnosis Difficulty walking;Abnormality of gait;Generalized weakness   PT Problem List Decreased strength;Decreased activity tolerance;Decreased balance;Decreased mobility;Cardiopulmonary status limiting activity  PT Treatment Interventions DME instruction;Gait training;Functional mobility training;Therapeutic activities;Therapeutic exercise;Stair training;Patient/family education   PT Goals (Current goals can be found in the Care Plan section) Acute Rehab PT Goals Patient Stated Goal: Go back to SNF PT Goal Formulation: With patient Time For Goal Achievement: 09/08/15 Potential to Achieve Goals: Fair (Pt is self limiting. )    Frequency Min 2X/week   Barriers to discharge Decreased caregiver support pt states he is alone at home majority of the time.     Co-evaluation               End of Session Equipment Utilized During Treatment: Gait belt Activity Tolerance: Patient tolerated treatment well Patient left: in chair;with call bell/phone within reach Nurse Communication: Mobility status         Time: [id number] PT Time Calculation (min) (ACUTE ONLY): 16 min   Charges:   PT Evaluation $PT Eval Low Complexity: 1 Procedure     PT G Codes:       Colon Branch, SPT Colon Branch 08/25/2015, 9:13 AM

## 2015-08-25 NOTE — Progress Notes (Signed)
  Date: 08/25/2015  Patient name: [name]  Medical record number: [id number]  Date of birth: 08/10/1945   This patient has been seen and the plan of care was discussed with the house staff. Please see their note for complete details. I concur with their findings with the following additions/corrections: [name] looked amazing today. He was able to lie flat and sleep for the first time in 3 days. He has no dyspnea, CP, cough. H irr irr. L good air flow. Clear. Ext +1 edema B (leghs were dependent). Plan was to get him off the dilt drip and back onto home BB and CCB doses along with Dig. This does not seem to be controlling his rate though so will set up with Dr Rayann Heman to cont to discuss intervention.  Likely D/C 8th  [name], MD 08/25/2015, 2:52 PM

## 2015-08-25 NOTE — Evaluation (Signed)
Occupational Therapy Evaluation Patient Details Name: [name] MRN: [id number] DOB: [birth year] Today's Date: 08/25/2015    History of Present Illness 70 y.o. male admitted with sepsis, afib with RVR, COPD Exacerbation, systolic CHF, Pneumonia, and Acute on CKD.   Clinical Impression   PT admitted with see above. Pt currently with functional limitiations due to the deficits listed below (see OT problem list). Pt with elevated hr 122 with sit<>stand and c/o dizziness. Pt with 15 point drop with static standing and urgency to sit on 3n1.  Pt will benefit from skilled OT to increase their independence and safety with adls and balance to allow discharge SNF.     Follow Up Recommendations  SNF (pt requesting return to SNF)    Equipment Recommendations  3 in 1 bedside comode    Recommendations for Other Services       Precautions / Restrictions Precautions Precautions: Fall      Mobility Bed Mobility Overal bed mobility: Modified Independent                Transfers Overall transfer level: Needs assistance Equipment used: Rolling walker (2 wheeled) Transfers: Sit to/from Stand Sit to Stand: Min guard              Balance                                            ADL Overall ADL's : Needs assistance/impaired                         Toilet Transfer: Min guard;Stand-pivot;RW;BSC   Toileting- Clothing Manipulation and Hygiene: Min guard;Sit to/from stand         General ADL Comments: see vitals for orthostatics. pt c/o dizziness and describes himself as spinning. pt sitting on commode to void. pt verbalized difficulty and Rn in room to (A) patient      Vision Additional Comments: `   Perception     Praxis      Pertinent Vitals/Pain Pain Assessment: No/denies pain Pain Intervention(s): Other (comment) (dizziness)     Hand Dominance Right   Extremity/Trunk Assessment Upper Extremity Assessment Upper Extremity  Assessment: Generalized weakness   Lower Extremity Assessment Lower Extremity Assessment: Generalized weakness   Cervical / Trunk Assessment Cervical / Trunk Assessment: Kyphotic   Communication Communication Communication: No difficulties   Cognition Arousal/Alertness: Awake/alert Behavior During Therapy: WFL for tasks assessed/performed Overall Cognitive Status: Within Functional Limits for tasks assessed                     General Comments       Exercises       Shoulder Instructions      Home Living Family/patient expects to be discharged to:: Skilled nursing facility                                        Prior Functioning/Environment Level of Independence: Needs assistance  Gait / Transfers Assistance Needed: Walked short distances with RW ADL's / Homemaking Assistance Needed: Needed assist with dressing        OT Diagnosis: Generalized weakness   OT Problem List: Decreased strength;Decreased activity tolerance;Impaired balance (sitting and/or standing);Decreased knowledge of use of DME or AE;Cardiopulmonary status limiting  activity;Pain   OT Treatment/Interventions: Self-care/ADL training;Therapeutic exercise;Energy conservation;DME and/or AE instruction;Therapeutic activities;Patient/family education;Balance training    OT Goals(Current goals can be found in the care plan section) Acute Rehab OT Goals Patient Stated Goal: Go back to SNF OT Goal Formulation: With patient Time For Goal Achievement: 09/01/15 Potential to Achieve Goals: Good  OT Frequency: Min 2X/week   Barriers to D/C:            Co-evaluation              End of Session Equipment Utilized During Treatment: Gait belt;Rolling walker Nurse Communication: Mobility status;Precautions  Activity Tolerance: Other (comment) (limited by dizziness) Patient left: Other (comment) (with RN on [facility])   [id number] OT Time Calculation (min): 13 min Charges:  OT  General Charges $OT Visit: 1 Procedure OT Evaluation $OT Eval Moderate Complexity: 1 Procedure G-Codes:    Parke Poisson B Sep 05, 2015, 1:30 PM   Jeri Modena   OTR/L Pager: [phone] Office: [phone] .

## 2015-08-26 LAB — CBC WITH DIFFERENTIAL/PLATELET
Basophils Absolute: 0 10*3/uL (ref 0.0–0.1)
Basophils Relative: 0 %
EOS PCT: 3 %
Eosinophils Absolute: 0.3 10*3/uL (ref 0.0–0.7)
HCT: 38.8 % — ABNORMAL LOW (ref 39.0–52.0)
Hemoglobin: 11.8 g/dL — ABNORMAL LOW (ref 13.0–17.0)
Lymphocytes Relative: 27 %
Lymphs Abs: 2.2 10*3/uL (ref 0.7–4.0)
MCH: 29.3 pg (ref 26.0–34.0)
MCHC: 30.4 g/dL (ref 30.0–36.0)
MCV: 96.3 fL (ref 78.0–100.0)
Monocytes Absolute: 0.6 10*3/uL (ref 0.1–1.0)
Monocytes Relative: 7 %
NEUTROS PCT: 63 %
Neutro Abs: 5.2 10*3/uL (ref 1.7–7.7)
PLATELETS: 134 10*3/uL — AB (ref 150–400)
RBC: 4.03 MIL/uL — ABNORMAL LOW (ref 4.22–5.81)
RDW: 17.5 % — AB (ref 11.5–15.5)
WBC: 8.2 10*3/uL (ref 4.0–10.5)

## 2015-08-26 LAB — BASIC METABOLIC PANEL
Anion gap: 11 (ref 5–15)
BUN: 31 mg/dL — ABNORMAL HIGH (ref 6–20)
CALCIUM: 8.3 mg/dL — AB (ref 8.9–10.3)
CO2: 22 mmol/L (ref 22–32)
CREATININE: 1.49 mg/dL — AB (ref 0.61–1.24)
Chloride: 114 mmol/L — ABNORMAL HIGH (ref 101–111)
GFR calc Af Amer: 53 mL/min — ABNORMAL LOW (ref >60)
GFR, EST NON AFRICAN AMERICAN: 45 mL/min — AB (ref >60)
GLUCOSE: 118 mg/dL — AB (ref 65–99)
Potassium: 4.2 mmol/L (ref 3.5–5.1)
Sodium: 147 mmol/L — ABNORMAL HIGH (ref 135–145)

## 2015-08-26 MED ORDER — GABAPENTIN 300 MG PO CAPS
300.0000 mg | ORAL_CAPSULE | Freq: Two times a day (BID) | ORAL | Status: DC
  Filled 2015-08-26 (×2): qty 1

## 2015-08-26 NOTE — Progress Notes (Signed)
Subjective: NAEON.  He is rate controlled while on PO meds and off of cardizem drip. He reports he is feeling well this morning.   Objective: Vital signs in last 24 hours: Filed Vitals:   08/26/15 0729 08/26/15 0813 08/26/15 0945 08/26/15 1139  BP:  112/80 107/61   Pulse:  87 106 110  Temp:  97.9 F (36.6 C) 98.4 F (36.9 C)   TempSrc:  Oral Oral   Resp:  20 18   Height:      Weight:      SpO2: 99% 100% 100%    Weight change:   Intake/Output Summary (Last 24 hours) at 08/26/15 1340 Last data filed at 08/26/15 0818  Gross per 24 hour  Intake    240 ml  Output    350 ml  Net   -110 ml   Physical Exam  Constitutional: He is oriented to person, place, and time and well-developed, well-nourished, and in no distress. No distress.  HENT:  Head: Normocephalic and atraumatic.  Eyes: EOM are normal. No scleral icterus.  Neck: No tracheal deviation present.  Cardiovascular: Normal rate and normal heart sounds.   Irregularly irregular  Pulmonary/Chest: Effort normal and breath sounds normal. No stridor. No respiratory distress. He has no wheezes.  No crackles appreciated.  Abdominal: Soft. He exhibits no distension. There is no tenderness. There is no rebound and no guarding.  Neurological: He is alert and oriented to person, place, and time.  Skin: Skin is warm and dry. He is not diaphoretic.    Lab Results: Basic Metabolic Panel:  Recent Labs Lab 08/25/15 0433 08/26/15 0447  NA 145 147*  K 4.5 4.2  CL 117* 114*  CO2 20* 22  GLUCOSE 106* 118*  BUN 36* 31*  CREATININE 1.69* 1.49*  CALCIUM 7.9* 8.3*   CBC:  Recent Labs Lab 08/24/15 0740 08/25/15 0433 08/26/15 0447  WBC 8.6 7.6 8.2  NEUTROABS 6.0  --  5.2  HGB 14.8 11.5* 11.8*  HCT 46.8 36.7* 38.8*  MCV 96.7 95.8 96.3  PLT 132* 127* 134*    Urinalysis:  Recent Labs Lab 08/24/15 1240  COLORURINE YELLOW  LABSPEC 1.020  PHURINE 5.5  GLUCOSEU NEGATIVE  HGBUR NEGATIVE  BILIRUBINUR NEGATIVE    KETONESUR NEGATIVE  PROTEINUR 100*  NITRITE NEGATIVE  LEUKOCYTESUR NEGATIVE   Micro Results: Recent Results (from the past 240 hour(s))  MRSA PCR Screening     Status: None   Collection Time: 08/24/15  5:43 PM  Result Value Ref Range Status   MRSA by PCR NEGATIVE NEGATIVE Final    Comment:        The GeneXpert MRSA Assay (FDA approved for NASAL specimens only), is one component of a comprehensive MRSA colonization surveillance program. It is not intended to diagnose MRSA infection nor to guide or monitor treatment for MRSA infections.    Medications: I have reviewed the patient's current medications. Scheduled Meds: . antiseptic oral rinse  7 mL Mouth Rinse BID  . apixaban  2.5 mg Oral BID  . aspirin  81 mg Oral Daily  . budesonide  0.25 mg Nebulization BID  . buPROPion  75 mg Oral BID  . digoxin  0.0625 mg Oral Daily  . diltiazem  120 mg Oral Daily  . feeding supplement (ENSURE ENLIVE)  1 Bottle Oral TID  . gabapentin  300 mg Oral BID  . metoprolol succinate  100 mg Oral Q2000  . metoprolol  200 mg Oral Daily  . mirtazapine  15 mg Oral QHS  . pantoprazole  40 mg Oral Daily  . sodium chloride flush  3 mL Intravenous Q12H   Continuous Infusions:  PRN Meds:.acetaminophen, ipratropium-albuterol, oxyCODONE-acetaminophen Assessment/Plan:  70 y.o. male w/ PMHx of HTN, HLD, CKD stage 3, COPD, h/o DVT on Eliquis, Tachy-Brady Syndrome s/p PPM placement, refractory atrial fibrillation (previously on Multaq, Tikosyn, Amio), chronic systolic CHF (EF [id number]), CAD, anxiety, and depression, admitted for recurrent dyspnea, and atrial fibrillation with RVR.  Atrial Fibrillation with RVR: Failed previous anti-arrhythmics such as Multaq, Tikosyn, an amiodarone (had thyroiditis from this). Has been on Toprol-XL 200 in AM, 100 in PM for some time, has been on and off of digoxin, although this was restarted during his most recent admission. Has been recently started on Cardizem CD 120 mg daily  for additional rate control despite his low EF per chart review and discussion with EP, Dr. Rayann Heman (according to Dr. Beryle Beams note on recent discharge). Rate controlled overnight with PO meds and off Dilt gtt.  Will send him to SNF today if possible and then have follow up with PCP and Cardiology.  - Digoxin 0.0625 mg daily - Diltiazem 120 mg daily - Metoprolol tartrate 200 mg qAM and 100 mg qPM - Continue Eliquis 2.5 mg bid (previously adjusted for Cr) - Duonebs q4h PRN - F/u with Dr. Rayann Heman for possible Maze procedure  Hyperkalemia, improved: s/p Kayexalate. Unclear etiology for acute increase in K. May be related to high doses of beta blockers (takes total of 300 mg of Toprol-XL at home). Will recheck K at PCP f/u - CTM  Chronic Pain: Patient alert today. Will restart home pain medications while we monitor HR.  However, his UDS was negative for opiates, so it does not appear he is chronically using. He reports he is not taking Percocet as often and not taking gabapentin at all. Will decrease his gabapentin upon discharge and will need to clarify use with PCP at follow up.  - Decrease Gabapentin to 100 mg TID - Percocet 5mg  q6h PRN pain -Check UDS -Tylenol prn for pain  CAD: Patient with chronic elevation in troponin, slightly elevated today. Had mild dull chest pain initially, although wonder if this is mostly related to his rapid heart rate and dyspnea. EKG with no obvious ischemic changes.  -Continue ASA, Statin  Depression/Anxiety: Stable.  - Continue Remeron, Wellbutrin  GERD: Stable - Continue Protonix  Diet: Heart healthy DVT/PE PPx: Eliquis Dispo: Discharge today if able to get back to SNF. Nurse checking if SW available.   The patient does have a current PCP (Zada Finders, MD) and does not need an [facility] hospital follow-up appointment after discharge.  The patient does not have transportation limitations that hinder transportation to clinic appointments.  .Services  Needed at time of discharge: Y = Yes, Blank = No PT:   OT:   RN:   Equipment:   Other:     LOS: 2 days   Juliet Rude, MD 08/26/2015, 1:40 PM

## 2015-08-26 NOTE — Discharge Instructions (Signed)

## 2015-08-27 DIAGNOSIS — I481 Persistent atrial fibrillation: Secondary | ICD-10-CM | POA: Diagnosis not present

## 2015-08-27 DIAGNOSIS — H9391 Unspecified disorder of right ear: Secondary | ICD-10-CM | POA: Diagnosis not present

## 2015-08-27 DIAGNOSIS — R2681 Unsteadiness on feet: Secondary | ICD-10-CM | POA: Diagnosis not present

## 2015-08-27 DIAGNOSIS — F319 Bipolar disorder, unspecified: Secondary | ICD-10-CM | POA: Diagnosis not present

## 2015-08-27 DIAGNOSIS — M62838 Other muscle spasm: Secondary | ICD-10-CM | POA: Diagnosis not present

## 2015-08-27 DIAGNOSIS — J441 Chronic obstructive pulmonary disease with (acute) exacerbation: Secondary | ICD-10-CM | POA: Diagnosis not present

## 2015-08-27 DIAGNOSIS — I4891 Unspecified atrial fibrillation: Secondary | ICD-10-CM | POA: Diagnosis not present

## 2015-08-27 DIAGNOSIS — I5042 Chronic combined systolic (congestive) and diastolic (congestive) heart failure: Secondary | ICD-10-CM | POA: Diagnosis not present

## 2015-08-27 DIAGNOSIS — I429 Cardiomyopathy, unspecified: Secondary | ICD-10-CM | POA: Diagnosis not present

## 2015-08-27 DIAGNOSIS — I10 Essential (primary) hypertension: Secondary | ICD-10-CM | POA: Diagnosis not present

## 2015-08-27 DIAGNOSIS — J9621 Acute and chronic respiratory failure with hypoxia: Secondary | ICD-10-CM | POA: Diagnosis not present

## 2015-08-27 DIAGNOSIS — R488 Other symbolic dysfunctions: Secondary | ICD-10-CM | POA: Diagnosis not present

## 2015-08-27 DIAGNOSIS — F329 Major depressive disorder, single episode, unspecified: Secondary | ICD-10-CM | POA: Diagnosis not present

## 2015-08-27 DIAGNOSIS — M6281 Muscle weakness (generalized): Secondary | ICD-10-CM | POA: Diagnosis not present

## 2015-08-27 DIAGNOSIS — R630 Anorexia: Secondary | ICD-10-CM | POA: Diagnosis not present

## 2015-08-27 DIAGNOSIS — G4700 Insomnia, unspecified: Secondary | ICD-10-CM | POA: Diagnosis not present

## 2015-08-27 DIAGNOSIS — L89891 Pressure ulcer of other site, stage 1: Secondary | ICD-10-CM | POA: Diagnosis not present

## 2015-08-27 DIAGNOSIS — I482 Chronic atrial fibrillation: Secondary | ICD-10-CM | POA: Diagnosis not present

## 2015-08-27 DIAGNOSIS — F419 Anxiety disorder, unspecified: Secondary | ICD-10-CM | POA: Diagnosis not present

## 2015-08-27 MED ORDER — GABAPENTIN 100 MG PO CAPS: 300.0000 mg | ORAL_CAPSULE | Freq: Three times a day (TID) | ORAL | Status: DC

## 2015-08-27 MED ORDER — GABAPENTIN 100 MG PO CAPS: 100.0000 mg | ORAL_CAPSULE | Freq: Three times a day (TID) | ORAL | Status: DC

## 2015-08-27 MED ORDER — TRAZODONE HCL 50 MG PO TABS
50.0000 mg | ORAL_TABLET | Freq: Once | ORAL | Status: AC
  Administered 2015-08-27: 50 mg via ORAL
  Filled 2015-08-27: qty 1

## 2015-08-27 NOTE — NC FL2 (Signed)
[place] LEVEL OF CARE SCREENING TOOL     IDENTIFICATION  Patient Name: [name] Birthdate: 1945-08-17 Sex: male Admission Date (Current Location): 08/24/2015  [facility] and [place] Number:  Herbalist and Address:  The [place]. [facility], [place] [address], [place], [place] [address]      Provider Number: [id number]  Attending Physician Name and Address:  [name], MD  Relative Name and Phone Number:       Current Level of Care: Hospital Recommended Level of Care: [place] Prior Approval Number:    Date Approved/Denied:   PASRR Number:    Discharge Plan: SNF    Current Diagnoses: Patient Active Problem List   Diagnosis Date Noted  . HCAP (healthcare-associated pneumonia) 08/24/2015  . Dyspnea 08/24/2015  . Coagulopathy ([place])   . Arterial embolus of lower extremity ([place])   . Chronic obstructive pulmonary disease ([place])   . Persistent atrial fibrillation ([place])   . 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 COPD exacerbation ([place])   . Acute respiratory failure ([place])   . Central line clotted   . Encounter for central line placement   . Malnutrition of moderate degree 08/13/2015  . Pressure ulcer 08/13/2015  . Ileus ([place])   . Hyperkalemia   . Intestinal obstruction ([place])   . Normal coronary arteries-2003 07/31/2015  . Demand ischemia ([place]) 07/31/2015  . Atrial fibrillation with RVR ([place]) 07/29/2015  . Acute systolic congestive heart failure ([place])   . Long term current use of opiate analgesic 06/27/2015  . Acute-on-chronic kidney injury ([place]) 06/25/2015  . Decreased appetite 06/19/2015  . Neuropathic pain of both feet ([place]) 05/03/2015  . Heel pain 03/02/2015  . Chronic pain 12/13/2014  . Sepsis secondary to UTI ([place]) 11/12/2014  . Protein-calorie malnutrition, severe ([place]) 10/26/2014  . Longstanding persistent atrial fibrillation ([place]) 10/11/2014  . STEMI (ST elevation myocardial infarction)  ([place]) 10/11/2014  . TIA (transient ischemic attack) 10/11/2014  . Unspecified constipation 01/25/2014  . Healthcare maintenance 01/25/2014  . BPH (benign prostatic hyperplasia) 01/20/2014  . PVD (peripheral vascular disease) ([place]) 12/10/2013  . Decreased hearing 09/08/2013  . Rash and nonspecific skin eruption 08/13/2013  . Non-ischemic cardiomyopathy ([place]) 05/17/2013  . Weight loss 03/19/2012  . Insomnia 11/19/2010  . Chronic combined systolic and diastolic congestive heart failure ([place]) 06/20/2010  . Long-term (current) use of anticoagulants 06/05/2010  . H/O Arterial embolism of leg  06/05/2010  . Depression 01/19/2010  . Chronic kidney disease (CKD), stage III (moderate) 11/18/2009  . History of hyperthyroidism 03/13/2009  . Bilateral hip pain 03/23/2007  . INTERMITTENT CLAUDICATION 10/24/2006  . OSTEOARTHRITIS 09/05/2006  . G E R D 09/04/2006  . HYDROCELE NOS 09/04/2006  . COPD (chronic obstructive pulmonary disease) ([place]) 08/28/2006  . BACK PAIN 06/02/2006  . ERECTILE DYSFUNCTION 03/01/2006  . History of tobacco abuse 03/01/2006  . Essential hypertension 03/01/2006  . Cardiac pacemaker in situ 03/01/2006  . SPONDYLOSIS 03/01/2006    Orientation RESPIRATION BLADDER Height & Weight     Self, Time, Situation, Place  O2 Indwelling catheter Weight: 179 lb 0.2 oz (81.2 kg) Height:  6\' 3"  (190.5 cm)  BEHAVIORAL SYMPTOMS/MOOD NEUROLOGICAL BOWEL NUTRITION STATUS      Continent Diet  AMBULATORY STATUS COMMUNICATION OF NEEDS Skin   Extensive Assist Verbally PU Stage and Appropriate Care                       Personal Care  Assistance Level of Assistance  Bathing, Feeding, Dressing Bathing Assistance: Limited assistance Feeding assistance: Limited assistance Dressing Assistance: Limited assistance     Functional Limitations Info  Sight, Hearing, Speech Sight Info: Adequate Hearing Info: Adequate Speech Info: Adequate    SPECIAL CARE FACTORS FREQUENCY  PT (By  licensed PT)                    Contractures Contractures Info: Not present    Additional Factors Info  Code Status, Isolation Precautions               Current Medications (08/27/2015):  This is the current hospital active medication list Current Facility-Administered Medications  Medication Dose Route Frequency Provider Last Rate Last Dose  . acetaminophen (TYLENOL) tablet 650 mg  650 mg Oral Q6H PRN Corky Sox, MD   650 mg at 08/27/15 0537  . antiseptic oral rinse (CPC / CETYLPYRIDINIUM CHLORIDE 0.05%) solution 7 mL  7 mL Mouth Rinse BID [name], MD   7 mL at 08/26/15 2200  . apixaban (ELIQUIS) tablet 2.5 mg  2.5 mg Oral BID Corky Sox, MD   2.5 mg at 08/26/15 2120  . aspirin chewable tablet 81 mg  81 mg Oral Daily Corky Sox, MD   81 mg at 08/26/15 1136  . budesonide (PULMICORT) nebulizer solution 0.25 mg  0.25 mg Nebulization BID Corky Sox, MD   0.25 mg at 08/27/15 [id number]  . buPROPion [facility]) tablet 75 mg  75 mg Oral BID Corky Sox, MD   75 mg at 08/26/15 2120  . digoxin (LANOXIN) tablet 0.0625 mg  0.0625 mg Oral Daily Corky Sox, MD   0.0625 mg at 08/26/15 1139  . diltiazem (CARDIZEM CD) 24 hr capsule 120 mg  120 mg Oral Daily Juliet Rude, MD   120 mg at 08/26/15 1137  . feeding supplement (ENSURE ENLIVE) (ENSURE ENLIVE) liquid 237 mL  1 Bottle Oral TID Corky Sox, MD   237 mL at 08/25/15 1036  . gabapentin (NEURONTIN) capsule 300 mg  300 mg Oral BID Juliet Rude, MD   300 mg at 08/26/15 1136  . ipratropium-albuterol (DUONEB) 0.5-2.5 (3) MG/3ML nebulizer solution 3 mL  3 mL Nebulization Q4H PRN Corky Sox, MD   3 mL at 08/26/15 [id number]  . metoprolol succinate (TOPROL-XL) 24 hr tablet 100 mg  100 mg Oral Q2000 Iline Oven, MD   100 mg at 08/26/15 2120  . metoprolol succinate (TOPROL-XL) 24 hr tablet 200 mg  200 mg Oral Daily Iline Oven, MD   200 mg at 08/26/15 0817  . mirtazapine (REMERON) tablet 15 mg  15 mg Oral QHS Corky Sox,  MD   15 mg at 08/26/15 2120  . oxyCODONE-acetaminophen (PERCOCET/ROXICET) 5-325 MG per tablet 1 tablet  1 tablet Oral Q6H PRN Iline Oven, MD      . pantoprazole (PROTONIX) EC tablet 40 mg  40 mg Oral Daily Corky Sox, MD   40 mg at 08/26/15 1136  . sodium chloride flush (NS) 0.9 % injection 3 mL  3 mL Intravenous Q12H Corky Sox, MD   3 mL at 08/26/15 2125  . traZODone (DESYREL) tablet 50 mg  50 mg Oral Once Juliet Rude, MD         Discharge Medications: Please see discharge summary for a list of discharge medications.  Relevant Imaging Results:  Relevant Lab Results:   Additional Information  [id number]  Ludwig Clarks, LCSW

## 2015-08-27 NOTE — Progress Notes (Signed)
Pharmacist Heart Failure Core Measure Documentation  Assessment: [name] has an EF documented as 30% on 08/14/2015 by ECHO.  Rationale: Heart failure patients with left ventricular systolic dysfunction (LVSD) and an EF < 40% should be prescribed an angiotensin converting enzyme inhibitor (ACEI) or angiotensin receptor blocker (ARB) at discharge unless a contraindication is documented in the medical record.  This patient is not currently on an ACEI or ARB for HF.  This note is being placed in the record in order to provide documentation that a contraindication to the use of these agents is present for this encounter.  ACE Inhibitor or Angiotensin Receptor Blocker is contraindicated (specify all that apply)  []   ACEI allergy AND ARB allergy []   Angioedema []   Moderate or severe aortic stenosis [x]   Hyperkalemia []   Hypotension []   Renal artery stenosis [x]   Worsening renal function, preexisting renal disease or dysfunction   [name] 08/27/2015 1:07 PM

## 2015-08-27 NOTE — NC FL2 (Signed)
[place] LEVEL OF CARE SCREENING TOOL     IDENTIFICATION  Patient Name: [name] Birthdate: Jun 20, 1945 Sex: male Admission Date (Current Location): 08/24/2015  [facility] and [place] Number:  Herbalist and Address:  The [place]. [facility], [place] [address], [place], [place] [address]      Provider Number: [id number]  Attending Physician Name and Address:  [name], MD  Relative Name and Phone Number:       Current Level of Care: Hospital Recommended Level of Care: [place] Prior Approval Number:    Date Approved/Denied:   PASRR Number:    Discharge Plan: SNF    Current Diagnoses: Patient Active Problem List   Diagnosis Date Noted  . HCAP (healthcare-associated pneumonia) 08/24/2015  . Dyspnea 08/24/2015  . Coagulopathy ([place])   . Arterial embolus of lower extremity ([place])   . Chronic obstructive pulmonary disease ([place])   . Persistent atrial fibrillation ([place])   . 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 COPD exacerbation ([place])   . Acute respiratory failure ([place])   . Central line clotted   . Encounter for central line placement   . Malnutrition of moderate degree 08/13/2015  . Pressure ulcer 08/13/2015  . Ileus ([place])   . Hyperkalemia   . Intestinal obstruction ([place])   . Normal coronary arteries-2003 07/31/2015  . Demand ischemia ([place]) 07/31/2015  . Atrial fibrillation with RVR ([place]) 07/29/2015  . Acute systolic congestive heart failure ([place])   . Long term current use of opiate analgesic 06/27/2015  . Acute-on-chronic kidney injury ([place]) 06/25/2015  . Decreased appetite 06/19/2015  . Neuropathic pain of both feet ([place]) 05/03/2015  . Heel pain 03/02/2015  . Chronic pain 12/13/2014  . Sepsis secondary to UTI ([place]) 11/12/2014  . Protein-calorie malnutrition, severe ([place]) 10/26/2014  . Longstanding persistent atrial fibrillation ([place]) 10/11/2014  . STEMI (ST elevation myocardial infarction)  ([place]) 10/11/2014  . TIA (transient ischemic attack) 10/11/2014  . Unspecified constipation 01/25/2014  . Healthcare maintenance 01/25/2014  . BPH (benign prostatic hyperplasia) 01/20/2014  . PVD (peripheral vascular disease) ([place]) 12/10/2013  . Decreased hearing 09/08/2013  . Rash and nonspecific skin eruption 08/13/2013  . Non-ischemic cardiomyopathy ([place]) 05/17/2013  . Weight loss 03/19/2012  . Insomnia 11/19/2010  . Chronic combined systolic and diastolic congestive heart failure ([place]) 06/20/2010  . Long-term (current) use of anticoagulants 06/05/2010  . H/O Arterial embolism of leg  06/05/2010  . Depression 01/19/2010  . Chronic kidney disease (CKD), stage III (moderate) 11/18/2009  . History of hyperthyroidism 03/13/2009  . Bilateral hip pain 03/23/2007  . INTERMITTENT CLAUDICATION 10/24/2006  . OSTEOARTHRITIS 09/05/2006  . G E R D 09/04/2006  . HYDROCELE NOS 09/04/2006  . COPD (chronic obstructive pulmonary disease) ([place]) 08/28/2006  . BACK PAIN 06/02/2006  . ERECTILE DYSFUNCTION 03/01/2006  . History of tobacco abuse 03/01/2006  . Essential hypertension 03/01/2006  . Cardiac pacemaker in situ 03/01/2006  . SPONDYLOSIS 03/01/2006    Orientation RESPIRATION BLADDER Height & Weight     Self, Time, Situation, Place  O2 Indwelling catheter Weight: 179 lb 0.2 oz (81.2 kg) Height:  6\' 3"  (190.5 cm)  BEHAVIORAL SYMPTOMS/MOOD NEUROLOGICAL BOWEL NUTRITION STATUS      Continent Diet  AMBULATORY STATUS COMMUNICATION OF NEEDS Skin   Extensive Assist Verbally PU Stage and Appropriate Care                       Personal Care  Assistance Level of Assistance  Bathing, Feeding, Dressing Bathing Assistance: Limited assistance Feeding assistance: Limited assistance Dressing Assistance: Limited assistance     Functional Limitations Info  Sight, Hearing, Speech Sight Info: Adequate Hearing Info: Adequate Speech Info: Adequate    SPECIAL CARE FACTORS FREQUENCY  PT (By  licensed PT)                    Contractures Contractures Info: Not present    Additional Factors Info  Code Status, Isolation Precautions               Current Medications (08/27/2015):  This is the current hospital active medication list Current Facility-Administered Medications  Medication Dose Route Frequency Provider Last Rate Last Dose  . acetaminophen (TYLENOL) tablet 650 mg  650 mg Oral Q6H PRN Corky Sox, MD   650 mg at 08/27/15 0537  . antiseptic oral rinse (CPC / CETYLPYRIDINIUM CHLORIDE 0.05%) solution 7 mL  7 mL Mouth Rinse BID [name], MD   7 mL at 08/26/15 2200  . apixaban (ELIQUIS) tablet 2.5 mg  2.5 mg Oral BID Corky Sox, MD   2.5 mg at 08/26/15 2120  . aspirin chewable tablet 81 mg  81 mg Oral Daily Corky Sox, MD   81 mg at 08/26/15 1136  . budesonide (PULMICORT) nebulizer solution 0.25 mg  0.25 mg Nebulization BID Corky Sox, MD   0.25 mg at 08/27/15 [id number]  . buPROPion [facility]) tablet 75 mg  75 mg Oral BID Corky Sox, MD   75 mg at 08/26/15 2120  . digoxin (LANOXIN) tablet 0.0625 mg  0.0625 mg Oral Daily Corky Sox, MD   0.0625 mg at 08/26/15 1139  . diltiazem (CARDIZEM CD) 24 hr capsule 120 mg  120 mg Oral Daily Juliet Rude, MD   120 mg at 08/26/15 1137  . feeding supplement (ENSURE ENLIVE) (ENSURE ENLIVE) liquid 237 mL  1 Bottle Oral TID Corky Sox, MD   237 mL at 08/25/15 1036  . gabapentin (NEURONTIN) capsule 300 mg  300 mg Oral BID Juliet Rude, MD   300 mg at 08/26/15 1136  . ipratropium-albuterol (DUONEB) 0.5-2.5 (3) MG/3ML nebulizer solution 3 mL  3 mL Nebulization Q4H PRN Corky Sox, MD   3 mL at 08/26/15 [id number]  . metoprolol succinate (TOPROL-XL) 24 hr tablet 100 mg  100 mg Oral Q2000 Iline Oven, MD   100 mg at 08/26/15 2120  . metoprolol succinate (TOPROL-XL) 24 hr tablet 200 mg  200 mg Oral Daily Iline Oven, MD   200 mg at 08/26/15 0817  . mirtazapine (REMERON) tablet 15 mg  15 mg Oral QHS Corky Sox,  MD   15 mg at 08/26/15 2120  . oxyCODONE-acetaminophen (PERCOCET/ROXICET) 5-325 MG per tablet 1 tablet  1 tablet Oral Q6H PRN Iline Oven, MD      . pantoprazole (PROTONIX) EC tablet 40 mg  40 mg Oral Daily Corky Sox, MD   40 mg at 08/26/15 1136  . sodium chloride flush (NS) 0.9 % injection 3 mL  3 mL Intravenous Q12H Corky Sox, MD   3 mL at 08/26/15 2125     Discharge Medications: Please see discharge summary for a list of discharge medications.  Relevant Imaging Results:  Relevant Lab Results:   Additional Information [id number]  Ludwig Clarks, LCSW

## 2015-08-27 NOTE — Progress Notes (Addendum)
Pt in stable condition, called in d/c report to the nurse at [place] care, iv removed, cardiac monitor dc, ccmd notified, pt belongings  at bedside ,pt taken off the floor by EMS

## 2015-08-27 NOTE — Progress Notes (Signed)
Subjective: No acute events overnight. He is rate controlled on PO meds. He is frustrated this morning, stating he did not sleep well.   Objective: Vital signs in last 24 hours: Filed Vitals:   08/27/15 0048 08/27/15 0424 08/27/15 0500 08/27/15 0821  BP: 108/75 110/85    Pulse: 46 69    Temp: 98 F (36.7 C) 98.2 F (36.8 C)    TempSrc: Oral Oral    Resp:  20    Height:      Weight:   179 lb 0.2 oz (81.2 kg)   SpO2: 100% 98%  99%   Weight change:   Intake/Output Summary (Last 24 hours) at 08/27/15 1003 Last data filed at 08/27/15 0426  Gross per 24 hour  Intake    480 ml  Output    200 ml  Net    280 ml   Physical Exam  Constitutional: He is oriented to person, place, and time and well-developed, well-nourished, and in no distress. No distress.  HENT:  Head: Normocephalic and atraumatic.  Eyes: EOM are normal. No scleral icterus.  Neck: No tracheal deviation present.  Cardiovascular: Normal rate and normal heart sounds.   Irregularly irregular  Pulmonary/Chest: Effort normal and breath sounds normal. No stridor. No respiratory distress. He has no wheezes.  No crackles appreciated.  Abdominal: Soft. He exhibits no distension. There is no tenderness. There is no rebound and no guarding.  Neurological: He is alert and oriented to person, place, and time.  Skin: Skin is warm and dry. He is not diaphoretic.    Lab Results: Basic Metabolic Panel:  Recent Labs Lab 08/25/15 0433 08/26/15 0447  NA 145 147*  K 4.5 4.2  CL 117* 114*  CO2 20* 22  GLUCOSE 106* 118*  BUN 36* 31*  CREATININE 1.69* 1.49*  CALCIUM 7.9* 8.3*   CBC:  Recent Labs Lab 08/24/15 0740 08/25/15 0433 08/26/15 0447  WBC 8.6 7.6 8.2  NEUTROABS 6.0  --  5.2  HGB 14.8 11.5* 11.8*  HCT 46.8 36.7* 38.8*  MCV 96.7 95.8 96.3  PLT 132* 127* 134*    Urinalysis:  Recent Labs Lab 08/24/15 1240  COLORURINE YELLOW  LABSPEC 1.020  PHURINE 5.5  GLUCOSEU NEGATIVE  HGBUR NEGATIVE  BILIRUBINUR  NEGATIVE  KETONESUR NEGATIVE  PROTEINUR 100*  NITRITE NEGATIVE  LEUKOCYTESUR NEGATIVE   Micro Results: Recent Results (from the past 240 hour(s))  MRSA PCR Screening     Status: None   Collection Time: 08/24/15  5:43 PM  Result Value Ref Range Status   MRSA by PCR NEGATIVE NEGATIVE Final    Comment:        The GeneXpert MRSA Assay (FDA approved for NASAL specimens only), is one component of a comprehensive MRSA colonization surveillance program. It is not intended to diagnose MRSA infection nor to guide or monitor treatment for MRSA infections.    Medications: I have reviewed the patient's current medications. Scheduled Meds: . antiseptic oral rinse  7 mL Mouth Rinse BID  . apixaban  2.5 mg Oral BID  . aspirin  81 mg Oral Daily  . budesonide  0.25 mg Nebulization BID  . buPROPion  75 mg Oral BID  . digoxin  0.0625 mg Oral Daily  . diltiazem  120 mg Oral Daily  . feeding supplement (ENSURE ENLIVE)  1 Bottle Oral TID  . gabapentin  300 mg Oral BID  . metoprolol succinate  100 mg Oral Q2000  . metoprolol  200 mg Oral Daily  .  mirtazapine  15 mg Oral QHS  . pantoprazole  40 mg Oral Daily  . sodium chloride flush  3 mL Intravenous Q12H  . traZODone  50 mg Oral Once   Continuous Infusions:  PRN Meds:.acetaminophen, ipratropium-albuterol, oxyCODONE-acetaminophen Assessment/Plan:  70 y.o. male w/ PMHx of HTN, HLD, CKD stage 3, COPD, h/o DVT on Eliquis, Tachy-Brady Syndrome s/p PPM placement, refractory atrial fibrillation (previously on Multaq, Tikosyn, Amio), chronic systolic CHF (EF [id number]), CAD, anxiety, and depression, admitted for recurrent dyspnea, and atrial fibrillation with RVR.  Atrial Fibrillation with RVR: Failed previous anti-arrhythmics such as Multaq, Tikosyn, an amiodarone (had thyroiditis from this). Has been on Toprol-XL 200 in AM, 100 in PM for some time, has been on and off of digoxin, although this was restarted during his most recent admission. Has been  recently started on Cardizem CD 120 mg daily for additional rate control despite his low EF per chart review and discussion with EP, Dr. Rayann Heman (according to Dr. Beryle Beams note on recent discharge). Rate controlled with PO meds.  Spoke with SW and will get him back to SNF today. He will have follow up with PCP and Cardiology.  - Digoxin 0.0625 mg daily  - Diltiazem 120 mg daily - Metoprolol tartrate 200 mg qAM and 100 mg qPM - Continue Eliquis 2.5 mg bid (previously adjusted for Cr) - Duonebs q4h PRN - F/u with Dr. Rayann Heman for possible Maze procedure  Hyperkalemia, improved: s/p Kayexalate. Unclear etiology for acute increase in K. May be related to high doses of beta blockers (takes total of 300 mg of Toprol-XL at home). Will recheck K at PCP f/u. - CTM  Chronic Pain: Patient alert today. Will restart home pain medications while we monitor HR.  However, his UDS was negative for opiates, so it does not appear he is chronically using. He reports he is not taking Percocet as often and not taking gabapentin at all. Will decrease his gabapentin upon discharge and will need to clarify use with PCP at follow up.  - Decrease Gabapentin to 100 mg TID and then wean off - Percocet 5mg  q6h PRN pain -Check UDS -Tylenol prn for pain  CAD: Patient with chronic elevation in troponin, slightly elevated today. Had mild dull chest pain initially, although wonder if this is mostly related to his rapid heart rate and dyspnea. EKG with no obvious ischemic changes.  -Continue ASA, Statin  Depression/Anxiety: Stable.  - Continue Remeron, Wellbutrin - Gave him Trazodone 50 mg x 1 to help with sleep this morning   GERD: Stable - Continue Protonix  Diet: Heart healthy DVT/PE PPx: Eliquis Dispo: Discharge today to SNF  The patient does have a current PCP (Zada Finders, MD) and does not need an [facility] hospital follow-up appointment after discharge.  The patient does not have transportation limitations that  hinder transportation to clinic appointments.  .Services Needed at time of discharge: Y = Yes, Blank = No PT:   OT:   RN:   Equipment:   Other:     LOS: 3 days   Juliet Rude, MD 08/27/2015, 10:03 AM

## 2015-08-27 NOTE — Clinical Social Work Note (Signed)
Clinical Social Work Assessment  Patient Details  Name: [name] MRN: [id number] Date of Birth: 1946/01/15  Date of referral:  08/27/15               Reason for consult:  Facility Placement                Permission sought to share information with:  Facility Art therapist granted to share information::     Name::        Agency::     Relationship::     Contact Information:     Housing/Transportation Living arrangements for the past 2 months:  [place] of Information:  Patient Patient Interpreter Needed:  None Criminal Activity/Legal Involvement Pertinent to Current Situation/Hospitalization:  No - Comment as needed Significant Relationships:    Lives with:  Facility Resident Do you feel safe going back to the place where you live?  Yes Need for family participation in patient care:  No (Coment)  Care giving concerns:  Patient for dc back to [facility] SNF today- patient agreeabl- SNF auth rec'd from [facility] # [id number].    Social Worker assessment / plan:  Plan transfer back to SNF bed at [facility] after lunch today via EMS.   Employment status:  Retired Nurse, adult PT Recommendations:  [place] / Referral to community resources:  [place]  Patient/Family's Response to care:  Patient agreeable to SNF return today.   Patient/Family's Understanding of and Emotional Response to Diagnosis, Current Treatment, and Prognosis:  No concerns- states he did not sleep well last night.   Emotional Assessment Appearance:  Appears stated age Attitude/Demeanor/Rapport:    Affect (typically observed):  Accepting, Appropriate Orientation:  Oriented to Self, Oriented to Place, Oriented to  Time, Oriented to Situation Alcohol / Substance use:  Not Applicable Psych involvement (Current and /or in the community):     Discharge Needs  Concerns to be addressed:  No  discharge needs identified Readmission within the last 30 days:  No Current discharge risk:  None Barriers to Discharge:  No Barriers Identified   Ludwig Clarks, LCSW 08/27/2015, 10:08 AM

## 2015-08-28 ENCOUNTER — Telehealth: Payer: Self-pay | Admitting: Internal Medicine

## 2015-08-28 DIAGNOSIS — F419 Anxiety disorder, unspecified: Secondary | ICD-10-CM | POA: Diagnosis not present

## 2015-08-28 DIAGNOSIS — G4700 Insomnia, unspecified: Secondary | ICD-10-CM | POA: Diagnosis not present

## 2015-08-28 DIAGNOSIS — J9621 Acute and chronic respiratory failure with hypoxia: Secondary | ICD-10-CM | POA: Diagnosis not present

## 2015-08-28 NOTE — Telephone Encounter (Signed)
Left message for patient to call me back. 

## 2015-08-28 NOTE — Telephone Encounter (Signed)
Pt calling re can't sleep, antsy ,jittery ,SOB, dizzy and lightheadedness , denies chest pain -seen in ER yesterday -all test show Afib- [phone]

## 2015-08-28 NOTE — Telephone Encounter (Signed)
Pt is calling to speak with Claiborne Billings about the Afib he has been in all day. Please f/u with him

## 2015-08-30 ENCOUNTER — Ambulatory Visit: Payer: Commercial Managed Care - HMO | Admitting: Internal Medicine

## 2015-09-01 ENCOUNTER — Other Ambulatory Visit: Payer: Self-pay | Admitting: *Deleted

## 2015-09-01 NOTE — Patient Outreach (Signed)
[facility]) Care Management  09/01/2015  TERRI KODAMA [birth year] [id number]   CSW made an initial attempt to try and meet with patient today at [facility] [place] where patient was recently placed to receive short-term rehabilitative services; however, patient was unavailable at the time of CSW's visit.  Patient's attending nurse at [facility] reported that patient was working with physical therapy and that he would not be available for at least one hour.  CSW was not able to perform the initial assessment, nor provide social work services and resources.  A HIPAA complaint message was left for patient at bedside.  CSW is currently awaiting a return call.  [name], BSW, MSW, LCSW  Licensed Education officer, environmental Health System  Mailing [place] [address], [place], [place] [address] Physical Address-[address], [place], [place] [address] Toll Free Main # [phone] Fax # [phone] Cell # [phone]  Fax # [phone]  [name]  [employer]  Discrimination is Against the [employer]. and its subsidiaries comply with applicable Federal civil rights laws and do not discriminate on the basis of race, color, national origin, age, disability, or sex. [place] do not exclude people or treat them differently because of race, color, national origin, age, disability, or sex.    [employer]. and its subsidiaries provide:  . Free auxiliary aids and services, such as qualified sign language interpreters, video remote interpretation, and written information in other formats to people with disabilities when such auxiliary aids and services are necessary to ensure an equal opportunity to participate. . Free language services to people whose primary language is not English when those services are necessary to provide meaningful  access, such as translated documents or oral interpretation.    If you need these services, call [phone] or if you use a TTY, call 711.   If you believe that [employer]. and its subsidiaries have failed to provide these services or discriminated in another way on the basis of race, color, national origin, age, disability, or sex, you can file a Tourist information centre manager with:   Discrimination [place] [address]   If you need help filing a grievance, call [phone] or if you use a TTY, call 711.  You can also file a civil rights complaint with the U.S. Department of Health and Financial controller, Office for Civil Rights electronically through the Office for Civil Rights Complaint Portal, available at OnSiteLending.nl.jsf, or by mail or phone at:   [place]. Department of Health and Human Services  [place], [place], [facility] Building  [place], [place]  [phone], [phone] (TDD)  Complaint forms are available at CutFunds.si             [place]: ATTENTION: If you do not speak English, language assistance services, free of charge, are available to you. Call [phone]  (TTY: [id number]).  Espaol (Spanish): ATENCIN: si habla espaol, tiene a su disposicin servicios gratuitos de asistencia lingstica. Llame al [phone] (TTY: [id number]).  ???? (Chinese): ?????????????????????????????? [phone] (TTY:711??  Ti?ng Vi?t (Vietnamese): CH : N?u b?n ni Ti?ng Vi?t, c cc d?ch v? h? tr? ngn ng? mi?n ph dnh cho b?n. G?i s? [phone] (TTY: [id number]).  ??? ([place]): ?? : ???? ????? ?? , ?? ?? ???? ??? ???? ? ???? . [phone] (TTY: 711)??? ??? ???? .  Tagalog (Tagalog -  Filipino): PAUNAWA: Kung nagsasalita ka ng Tagalog, maaari kang gumamit ng mga serbisyo ng tulong sa wika nang walang bayad. [place] sa [phone]  (TTY: [id number]).   [place]): :      ,      .  [phone] (: [id number]).  Kreyl Ayisyen ([place]): ATANSYON: Si w pale Ethelene Hal, gen svis d pou lang ki disponib gratis pou ou. Rele [phone] (TTY: [id number]).  Fonnie Jarvis Marland Kitchen[place]): ATTENTION : Si vous parlez franais, des services d'aide linguistique vous sont proposs gratuitement. Appelez le [phone] (ATS : [id number]).  Polski (Polish): UWAGA: Jeeli mwisz po polsku, moesz skorzysta z bezpatnej pomocy jzykowej. Zadzwo pod numer [phone] (TTY: [id number]).  Portugus ([place]): ATENO: Se fala portugus, encontram-se disponveis servios lingusticos, grtis. Ligue para [phone] (TTY: [id number]).   Italiano ([place]): ATTENZIONE: In caso la lingua parlata sia l'italiano, sono disponibili servizi di assistenza linguistica gratuiti. Chiamare il numero [phone] (TTY: [id number]).  Dawayne Patricia ([place]): ACHTUNG: Wenn Sie Deutsch sprechen, stehen Ihnen kostenlos sprachliche Hilfsdienstleistungen zur [employer]. [place]: [phone] (TTY: [id number]).   (Arabic): [phone]   .            : .)[id number] :   (  ??? ([place]): ??????????????????????????????????[phone] ?TTY?711?????????????????  ? (Farsi): [phone]  . ?   ? ?  ? ? ?~ ?  ?    : .??  (TTY: 711)  Din Bizaad ([place]): D77 baa ak0 n7n7zin: D77 saad bee y1n7[ti'go Risa Grill, saad bee [id number]'d66', t'11 Pricilla Loveless n1 h0l=, koj8' [id number] [phone] (TTY: [id number]).

## 2015-09-01 NOTE — Telephone Encounter (Signed)
Spoke with patient and let him know I was trying to see if we had a way to help him get here Mon.  He is also seeing if anyone he knows can help.  I will let him know when I hear back

## 2015-09-01 NOTE — Telephone Encounter (Signed)
Pt is calling back to speak with you.

## 2015-09-01 NOTE — Telephone Encounter (Signed)
Patient has a follow up appointment on Mon and says he is to pay the $50 that the facility([facility]) is trying to charge him.  Called facility fand spoke with Tammy.  They do have have transportation services.  We are trying to see now if we can help him to his appointment Mon.  I have asked Tammy to explain to him that they do not have the services and they can call a family member, just not sure he has anyone.

## 2015-09-04 ENCOUNTER — Encounter: Payer: Self-pay | Admitting: Internal Medicine

## 2015-09-04 ENCOUNTER — Ambulatory Visit (INDEPENDENT_AMBULATORY_CARE_PROVIDER_SITE_OTHER): Payer: Commercial Managed Care - HMO | Admitting: Internal Medicine

## 2015-09-04 VITALS — BP 98/70 | HR 83 | Ht 75.0 in | Wt 186.4 lb

## 2015-09-04 DIAGNOSIS — I481 Persistent atrial fibrillation: Secondary | ICD-10-CM | POA: Diagnosis not present

## 2015-09-04 DIAGNOSIS — I5042 Chronic combined systolic (congestive) and diastolic (congestive) heart failure: Secondary | ICD-10-CM

## 2015-09-04 DIAGNOSIS — I428 Other cardiomyopathies: Secondary | ICD-10-CM

## 2015-09-04 DIAGNOSIS — I4891 Unspecified atrial fibrillation: Secondary | ICD-10-CM

## 2015-09-04 DIAGNOSIS — I429 Cardiomyopathy, unspecified: Secondary | ICD-10-CM | POA: Diagnosis not present

## 2015-09-04 DIAGNOSIS — F419 Anxiety disorder, unspecified: Secondary | ICD-10-CM | POA: Diagnosis not present

## 2015-09-04 MED ORDER — DILTIAZEM HCL 30 MG PO TABS: ORAL_TABLET | ORAL | Status: DC

## 2015-09-04 MED ORDER — FUROSEMIDE 40 MG PO TABS: 40.0000 mg | ORAL_TABLET | Freq: Every day | ORAL | Status: DC | PRN

## 2015-09-04 NOTE — Progress Notes (Signed)
PCP: Zada Finders, MD   [name] is a 70 y.o. male who presents today for electrophysiology followup.  Remains sob.  Continues to have difficulty with AF with RVR.  Very weak and tried.  Today, he denies symptoms of palpitations, chest pain,   lower extremity edema, presyncope, or syncope.  The patient is otherwise without complaint today.   Past Medical History  Diagnosis Date  . Hypertension   . Chronic systolic heart failure ([place])     a. NICM - EF 35% with normal cors 2003. b. Last echo 11/2012 - EF 25-30%.  . Depression   . GERD (gastroesophageal reflux disease)   . Portal vein thrombosis   . Avascular necrosis of bones of both hips (HCC)     Status post bilateral hip replacements  . Gastritis   . Alcohol abuse   . Erectile dysfunction   . Bipolar disorder ([place])   . Hydrocele, unspecified   . Lung nodule     Chest CT scan on 12/14/2008 showed a nodular opacity at the left lung base felt to most likely represent scarring.  Follow-up chest CT scan on 06/02/2010 showed parenchymal scarring in the left apex, left  lower lobe, and lingula; some of this scarring at the left base had a nodular appearance, unchanged. Chest 09/2012 - stable.  . 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  Thyroid scan 08/28/2009 was normal with no focal areas of abnormal increased or decreased activity seen; the uptake of I 131 sodium iodide at 24 hours was 5.7%.  TSH and free T4 normalized by 08/16/2009.  . Intestinal obstruction ([place])   . COPD (chronic obstructive pulmonary disease) ([place])   . E coli bacteremia   . DVT (deep venous thrombosis) ([place])     He has hypercoagulability with multiple prior DVTs  . Pleural plaque     H/o asbestos exposure. Chest CT on 06/02/2010 showed stable extensive calcified pleural plaques involving the left hemithorax, consistent with asbestos related pleural disease.  . Weight loss     Normal colonoscopy by Dr. Olevia Perches on 11/06/2010.   . Tachycardia-bradycardia syndrome [facility])     a. 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 b. St. Jude gen change 2010.  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  . Noncompliance   . Thrombocytopenia ([place])   . Osteoarthritis   . Spondylosis   . Anxiety   . CKD (chronic kidney disease)     Renal U/S 12/04/2009 showed no pathological findings. Labs 12/04/2009 include normal ESR, C3, C4; neg ANA; SPEP showed nonspecific increase in the alpha-2 region with no M-spike; UPEP showed no monoclonal free light chains; urine IFE showed polyclonal increase in feree Kappa and/or free Lambda light chains. Baseline Cr reported 1.7-2.5.  . STEMI (ST elevation myocardial infarction) ([place]) 10/11/2014  . Hyperlipemia   . HCAP (healthcare-associated pneumonia) 08/24/2015  . On home oxygen therapy     "3L periodically" (08/24/2015)   Past Surgical History  Procedure Laterality Date  . Total hip arthroplasty Right 03/08/2008     By Dr. Hiram Comber  . Tee without cardioversion N/A 05/27/2013    Procedure: TRANSESOPHAGEAL ECHOCARDIOGRAM (TEE);  Surgeon: Candee Furbish, MD;  Location: [facility] ENDOSCOPY;  Service: Cardiovascular;  Laterality: N/A;  . Cardioversion N/A 05/27/2013    Procedure: CARDIOVERSION;  Surgeon: Candee Furbish, MD;  Location: [place];  Service: Cardiovascular;  Laterality: N/A;  . Cardiac catheterization  2003    Nl cors, EF 35%  . Embolectomy Right 12/10/2013    Procedure: Right Femoral Embolectomy; Fasciotomy Right Lower Leg with Intraoperative arteriogram;  Surgeon: Rosetta Posner, MD;  Location: [place];  Service: Vascular;  Laterality: Right;  . Intraoperative arteriogram Right 12/10/2013    Procedure: INTRA OPERATIVE ARTERIOGRAM;  Surgeon: Rosetta Posner, MD;  Location: [place];  Service: Vascular;  Laterality: Right;  . Fasciotomy closure Right 12/15/2013    Procedure: LEG FASCIOTOMY CLOSURE;  Surgeon: Rosetta Posner, MD;  Location: [facility] OR;  Service: Vascular;  Laterality: Right;  . Embolectomy Left 10/12/2014    Procedure: Left Femoral Thrombectomy with intraoperative arteriogram;  Surgeon: Rosetta Posner, MD;  Location: [facility] OR;  Service: Vascular;  Laterality: Left;  . Hemiarthroplasty hip Left 09/09/2002    bipolar; Dr. Lafayette Dragon  . Joint replacement    . Insert / replace / remove pacemaker  02/2003    Dr. Roe Rutherford, [place] pulse generator identity SR model [id number] serial [id number]; gen change by Greggory Brandy  . Insert / replace / remove pacemaker  06/17/2008    Lead and generator change w/ St. Jude Medical Tendril ST model [id number] (serial  [id number]).         ROS- all systems are reviewed and negative except as per HPI above  Current Outpatient Prescriptions  Medication Sig Dispense Refill  . albuterol (VENTOLIN HFA) 108 (90 BASE) MCG/ACT inhaler Inhale 2 puffs into the lungs every 6 (six) hours as needed for wheezing or shortness of breath. 3 Inhaler 3  . apixaban (ELIQUIS) 2.5 MG TABS tablet Take 1 tablet (2.5 mg total) by mouth 2 (two) times daily. 60 tablet 3  . aspirin 81 MG chewable tablet Chew 1 tablet (81 mg total) by mouth daily. 30 tablet 12  . bisacodyl (DULCOLAX) 10 MG suppository Place 1 suppository (10 mg total) rectally daily as needed for severe constipation. 12 suppository 5  . budesonide (PULMICORT) 0.25 MG/2ML nebulizer solution Take 2 mLs (0.25 mg total) by nebulization 2 (two) times daily. 60 mL 12  . buPROPion (WELLBUTRIN) 75 MG tablet Take 1 tablet (75 mg total) by mouth 2 (two) times daily. 180 tablet 3  . cholecalciferol (VITAMIN D) 1000 units tablet Take 1,000 Units by mouth daily.    . digoxin 62.5 MCG TABS Take 0.0625 mg by mouth daily. 30 tablet 2  . diltiazem (CARDIZEM CD) 120 MG 24 hr capsule Take 1 capsule (120 mg total) by mouth daily. 30 capsule 2  . gabapentin (NEURONTIN) 100 MG capsule Take 3 capsules (300 mg total) by mouth 3 (three) times daily. 90 capsule  1  . ipratropium (ATROVENT) 0.02 % nebulizer solution Take 0.5 mg by nebulization 3 (three) times daily.    [name] levalbuterol (XOPENEX) 0.63 MG/3ML nebulizer solution Take 0.63 mg by nebulization 3 (three) times daily.    . metoprolol succinate (TOPROL-XL) 100 MG 24 hr tablet Take 100-200 mg by mouth See admin instructions. Take 2 tablets every morning and take 1 tablet every evening    . metoprolol succinate (TOPROL-XL) 100 MG 24 hr tablet Take 100 mg by mouth daily. Take with or immediately following a meal.    . mirtazapine (REMERON) 15 MG tablet TAKE 1 TABLET BY MOUTH AT BEDTIME 30 tablet 5  . Multiple Vitamin (MULTIVITAMIN WITH MINERALS) TABS tablet Take 1 tablet by mouth daily.    . Nutritional Supplements (ENSURE CLEAR) LIQD Take  1 each by mouth 3 (three) times daily.    [name] omeprazole (PRILOSEC) 40 MG capsule Take 1 capsule (40 mg total) by mouth daily. 90 capsule 3  . rosuvastatin (CRESTOR) 20 MG tablet Take 1 tablet (20 mg total) by mouth daily at 6 PM. 90 tablet 3  . senna-docusate ([employer] SENNA PLUS) 8.6-50 MG tablet Take 1 tablet by mouth daily as needed for moderate constipation. 360 tablet 3  . tiotropium (SPIRIVA) 18 MCG inhalation capsule Place 1 capsule (18 mcg total) into inhaler and inhale daily. 90 capsule 3  . oxyCODONE-acetaminophen (PERCOCET/ROXICET) 5-325 MG tablet Take 1 tablet by mouth every 6 (six) hours as needed for severe pain.    [name] Urea 37.5 % CREA Apply 1 application topically daily as needed (for irritation). 142 g 0   No current facility-administered medications for this visit.    Physical Exam: Filed Vitals:   09/04/15 1522  BP: 98/70  Pulse: 83  Height: _0  (1.905 m)  Weight: 186 lb 6.4 oz (84.55 kg)    GEN- The patient is chronically ill appearing, alert and oriented x 3 today.   Head- normocephalic, atraumatic Eyes-  Sclera clear, conjunctiva pink Ears- hearing intact Oropharynx- clear Lungs- bibasilar rales 1/3 way up, normal work of breathing Chest-  pacemaker pocket is well healed Heart- irregular rhythm GI- soft, NT, ND, + BS Extremities- no clubbing, cyanosis, or edema  Pacemaker interrogation- reviewed in detail today,  See PACEART report ekg today reveals afib, V rates 83 bpm, nonspecific ST/T changes  Assessment and Plan:  1. Bradycardia/ tachycardia syndrome Normal pacemaker function See Pace Art report No changes today  2. Afib/ atrial flutter Longstanding persistent He has failed multaq, tikosyn, and amiodarone Unfortunately rate control has been a challenge. He has a rate related cardiomyopathy Continue anticoagulation long term I previously attempted an atrial flutter ablation but was unable to achieve access to his heart from the femoral vein due to venous occlusive disease Epicardial ablation is about the only option that we have at this time. I will therefore refer to Dr Roxy Manns for discussion/ consideration of stand alone MAZE and LAA ligation. Prior cath (remote) revealed no CAD.  I will wait until after discussions with Dr Roxy Manns before any additional CV testing. Today I will give prn cardizem which he can take for palpitations  3. Chronic systolic dysfunction Likely tachycardia mediated unfortunately given that he has failed medical therapy and has venous occlusions, our options are limited Given recurrent infections and bacteremia, I would not advise AV nodal ablation for this patient. Will refer to Dr Roxy Manns as above Add lasix 38m daily x 5 days for volume overload   Today, I have spent 40 minutes with the patient discussing atrial fibrillation management .  More than 50% of the visit time today was spent on this issue.  JJeneen RinksAllred,MD

## 2015-09-04 NOTE — Telephone Encounter (Signed)
F/U  Phone call    Calling to speak with you about phone call on Friday .[name] Thanks

## 2015-09-04 NOTE — Patient Instructions (Addendum)
Medication Instructions:  Your physician has recommended you make the following change in your medication:  1) Start Cardizem 30 mg ---take every 4-6 hours as needed for palpitations and or heart rates grater than 100 2) Take Furosemise 40 mg daily for 5 days   Labwork: None ordered   Testing/Procedures: You have been referred to Dr Herminio Commons procedure   Follow-Up: Your physician recommends that you schedule a follow-up appointment in: 6 weeks with Dr Rayann Heman   Any Other Special Instructions Will Be Listed Below (If Applicable).     If you need a refill on your cardiac medications before your next appointment, please call your pharmacy.

## 2015-09-04 NOTE — Telephone Encounter (Signed)
He worked it out to come to appointment this afternoon.  Discussed with Dr Rayann Heman, will most likely increase his Diltiazem to 240 mg daily

## 2015-09-08 ENCOUNTER — Other Ambulatory Visit: Payer: Self-pay | Admitting: *Deleted

## 2015-09-10 ENCOUNTER — Encounter: Payer: Self-pay | Admitting: *Deleted

## 2015-09-10 NOTE — Patient Outreach (Signed)
[name]) Care Management  09/10/2015  [name] 23-Dec-1945 [id number]   CSW was able to make initial contact with patient today to perform phone assessment, as well as assess and assist with social work needs and services.  CSW introduced self, explained role and types of services provided through [place] [facility] ([place] [facility]).  CSW further explained to patient that CSW works with patient's RNCM, also with [place] [facility], [name]. CSW then explained the reason for the call, indicating that Ms. [name] thought that patient would benefit from social work services and resources to assist with possible discharge planning needs and services, as patient currently resides at [facility] to receive short-term rehabilitative services.  CSW obtained two HIPAA compliant identifiers from patient, which included patient's name and date of birth. Patient admitted to [place] that he was scheduled for discharge from [facility] on Tuesday, September 12, 2015, but that he plans to return home sooner because he does not wish to have to pay additional co-pay expenses for an extended length of stay.  Not to mention, patient reported, "I'm as ready as I will ever be to go back home".  Patient plans to return home to live with his fiance, as well as receive home health services through an agency of choice.  Patient denies any social work specific needs at present, but took down CSW's contact information, agreeing to call CSW if social work needs arise in the near future.  Patient is hopeful to push for discharge later today. CSW will perform a case closure on patient, as all goals of treatment have been met from social work standpoint and no additional social work needs have been identified at this time. CSW will notify patient's RNCM with [place] [facility], [name] of CSW's plans  to close patient's case. CSW will fax a correspondence letter to patient's Primary Care Physician, Dr. Zada [name] to ensure that Dr. Posey [name] is aware of CSW's case closure plans.   CSW will submit a case closure request to [name], Care Management Assistant with [place] [facility], in the form of an [employer].  CSW will ensure that [name] is aware of [name], RNCM with [place] [facility], continued involvement with patient's care.  [name], BSW, MSW, LCSW  Licensed Education officer, environmental Health System  Mailing [place] [address] [place], [place] [address] Physical Address-[address], [place], [place] [address] Toll Free Main # [phone] Fax # [phone] Cell # [phone]  Fax # [phone]  [name] [employer]  Discrimination is Against the [employer]. and its subsidiaries comply with applicable Federal civil rights laws and do not discriminate on the basis of race, color, national origin, age, disability, or sex. [place] do not exclude people or treat them differently because of race, color, national origin, age, disability, or sex.    [employer]. and its subsidiaries provide:  . Free auxiliary aids and services, such as qualified sign language interpreters, video remote interpretation, and written information in other formats to people with disabilities when such auxiliary aids and services are necessary to ensure an equal opportunity to participate. . Free language services to people whose primary language is not English when those services are necessary to provide meaningful access, such as translated documents or oral interpretation.  If you need these services, call [phone] or if you use a TTY, call 711.   If you believe that [employer]. and its subsidiaries have failed to provide these services  or discriminated in another way on the basis of race, color, national origin, age, disability, or sex, you can file a Tourist information centre manager with:   [place]rimination [place] [address]   If you need help filing a grievance, call [phone] or if you use a TTY, call 711.  You can also file a civil rights complaint with the U.S. Department of Health and Financial controller, Office for Civil Rights electronically through the Office for Civil Rights Complaint Portal, available at OnSiteLending.nl.jsf, or by mail or phone at:   [place]. Department of Health and Human Services  [place], [place], [facility]  [place], [place]  [phone], [phone] (TDD)  Complaint forms are available at CutFunds.si             [place]: ATTENTION: If you do not speak English, language assistance services, free of charge, are available to you. Call [phone]  (TTY: 530).  Espaol (Spanish): ATENCIN: si habla espaol, tiene a su disposicin servicios gratuitos de asistencia lingstica. Llame al [phone] (TTY: 567).  ???? (Chinese): ?????????????????????????????? [phone] (TTY:711??  Ti?ng Vi?t (Vietnamese): CH : N?u b?n ni Ti?ng Vi?t, c cc d?ch v? h? tr? ngn ng? mi?n ph dnh cho b?n. G?i s? [phone] (TTY: 206).  ??? ([place]): ?? : ???? ????? ?? , ?? ?? ???? ??? ???? ? ???? . [phone] (TTY: 711)??? ??? ???? .  Tagalog (Tagalog - Filipino): PAUNAWA: Kung nagsasalita ka ng Tagalog, maaari kang gumamit ng mga serbisyo ng tulong sa wika nang walang bayad. [place] sa [phone] (TTY: 747).   [place]): :      ,      .  [phone] (: 381).  Kreyl Ayisyen ([place]): ATANSYON: Si w pale Ethelene Hal, gen  svis d pou lang ki disponib gratis pou ou. Rele [phone] (TTY: 770).  Fonnie Jarvis Marland KitchenPakistan): ATTENTION : Si vous parlez franais, des services d'aide linguistique vous sont proposs gratuitement. Appelez le [phone] (ATS : 909).  Polski (Polish): UWAGA: Jeeli mwisz po polsku, moesz skorzysta z bezpatnej pomocy jzykowej. Zadzwo pod numer [phone] (TTY: 507).  Portugus ([place]): ATENO: Se fala portugus, encontram-se disponveis servios lingusticos, grtis. Ligue para [phone] (TTY: 582).   Italiano ([place]): ATTENZIONE: In caso la lingua parlata sia l'italiano, sono disponibili servizi di assistenza linguistica gratuiti. Chiamare il numero [phone] (TTY: 281).  Dawayne Patricia ([place]): ACHTUNG: Wenn Sie Deutsch sprechen, stehen Ihnen kostenlos sprachliche Hilfsdienstleistungen zur [employer]. Rufnum[place]: [phone] (TTY: 815).   (Arabic): [phone]   .            : .)437 :   (  ??? ([place]): ??????????????????????????????????[phone] ?TTY?711?????????????????  ? (Farsi): [phone]  . ?   ? ?  ? ? ?~ ?  ?    : .??  (TTY: 711)  Din Bizaad ([place]): D77 baa ak0 n7n7zin: D77 saad bee y1n7[ti'go Risa Grill, saad bee [id number]'d66', t'11 Pricilla Loveless n1 h0l=, koj8' [id number] [phone] (TTY: 015).

## 2015-09-11 NOTE — Addendum Note (Signed)
Addended by: Freada Bergeron on: 09/11/2015 04:33 PM   Modules accepted: Orders

## 2015-09-12 ENCOUNTER — Other Ambulatory Visit: Payer: Self-pay | Admitting: *Deleted

## 2015-09-13 NOTE — Patient Outreach (Signed)
[facility]) Care Management  09/13/2015  [name] [birth year] [id number]   Telephone Assessment (SNF at this time)  RN spoke with pt today and introduced the [facility] program and purpose of today's call. Pt indicates he could use some assistance with assistance in the home and a wheelchair. Pt also stated he remains in the SNF with plans to be discharged possibly tomorrow. RN encouraged pt to consult with personnel at the facility (case manager or social worker) with he request. RN also will alert [facility] social worker that pt may need assistance with the above request. RN has informed pt that his discharge information will probably be presented to him upon his discharge. Pt receptive and RN will follow up with pt in a few days upon his discharge from the SNF.  [name], RN Care Management Coordinator [place] Office [phone]

## 2015-09-14 ENCOUNTER — Telehealth: Payer: Self-pay | Admitting: Internal Medicine

## 2015-09-14 ENCOUNTER — Other Ambulatory Visit: Payer: Self-pay | Admitting: *Deleted

## 2015-09-14 NOTE — Patient Outreach (Signed)
[facility]) Care Management  09/14/2015  [name] [birth year] [id number]  RN briefly spoke with pt today and inquired on his discharge. Pt states he will not be discharged from the SNF until Saturday. RN offered to follow up with him on Monday next week as pt remains receptive. RN also verified pt's needs have been addressed from the last conversation. Pt confirmed assistance but stated he will need assistance from his daughter on the co-pay for the wheelchair of $87.  No other issues at this time as RN requested to follow up with pt on Monday to start the transition of care calls and inquired further on his needs at this time (pt receptive).  [name], RN Care Management Coordinator [place] Office [phone]

## 2015-09-14 NOTE — Telephone Encounter (Signed)
error 

## 2015-09-15 DIAGNOSIS — H9391 Unspecified disorder of right ear: Secondary | ICD-10-CM | POA: Diagnosis not present

## 2015-09-15 DIAGNOSIS — M62838 Other muscle spasm: Secondary | ICD-10-CM | POA: Diagnosis not present

## 2015-09-15 DIAGNOSIS — L89891 Pressure ulcer of other site, stage 1: Secondary | ICD-10-CM | POA: Diagnosis not present

## 2015-09-15 DIAGNOSIS — F419 Anxiety disorder, unspecified: Secondary | ICD-10-CM | POA: Diagnosis not present

## 2015-09-18 ENCOUNTER — Other Ambulatory Visit: Payer: Self-pay | Admitting: *Deleted

## 2015-09-18 ENCOUNTER — Encounter: Payer: Self-pay | Admitting: Thoracic Surgery (Cardiothoracic Vascular Surgery)

## 2015-09-18 ENCOUNTER — Encounter: Payer: Commercial Managed Care - HMO | Admitting: Thoracic Surgery (Cardiothoracic Vascular Surgery)

## 2015-09-18 NOTE — Progress Notes (Signed)
This encounter was created in error - please disregard.  This encounter was created in error - please disregard.

## 2015-09-18 NOTE — Patient Outreach (Signed)
[facility]) Care Management  09/18/2015  [name] [birth year] [id number]  Transition of care (week 1)  RN attempted outreach call today to pt however unsuccessful. RN able to leave a HIPAA approved voice message requesting a call back. Will continue outreach attempts for possible services and ongoing transition of care.  [name], RN Care Management Coordinator [place] Office [phone]

## 2015-09-19 ENCOUNTER — Emergency Department (HOSPITAL_COMMUNITY): Payer: Commercial Managed Care - HMO

## 2015-09-19 ENCOUNTER — Encounter (HOSPITAL_COMMUNITY): Payer: Self-pay | Admitting: Emergency Medicine

## 2015-09-19 ENCOUNTER — Telehealth: Payer: Self-pay | Admitting: Internal Medicine

## 2015-09-19 ENCOUNTER — Inpatient Hospital Stay (HOSPITAL_COMMUNITY)
Admission: EM | Admit: 2015-09-19 | Discharge: 2015-09-22 | DRG: 871 | Disposition: A | Discharge status: Skilled Nursing Facility | Payer: Commercial Managed Care - HMO | Attending: Oncology | Admitting: Oncology

## 2015-09-19 DIAGNOSIS — Z96641 Presence of right artificial hip joint: Secondary | ICD-10-CM | POA: Diagnosis present

## 2015-09-19 DIAGNOSIS — J9600 Acute respiratory failure, unspecified whether with hypoxia or hypercapnia: Secondary | ICD-10-CM

## 2015-09-19 DIAGNOSIS — I429 Cardiomyopathy, unspecified: Secondary | ICD-10-CM | POA: Diagnosis not present

## 2015-09-19 DIAGNOSIS — I5022 Chronic systolic (congestive) heart failure: Secondary | ICD-10-CM | POA: Diagnosis present

## 2015-09-19 DIAGNOSIS — Z7901 Long term (current) use of anticoagulants: Secondary | ICD-10-CM

## 2015-09-19 DIAGNOSIS — J449 Chronic obstructive pulmonary disease, unspecified: Secondary | ICD-10-CM | POA: Diagnosis not present

## 2015-09-19 DIAGNOSIS — R451 Restlessness and agitation: Secondary | ICD-10-CM | POA: Diagnosis present

## 2015-09-19 DIAGNOSIS — F419 Anxiety disorder, unspecified: Secondary | ICD-10-CM | POA: Diagnosis present

## 2015-09-19 DIAGNOSIS — E872 Acidosis: Secondary | ICD-10-CM | POA: Diagnosis present

## 2015-09-19 DIAGNOSIS — I252 Old myocardial infarction: Secondary | ICD-10-CM | POA: Diagnosis not present

## 2015-09-19 DIAGNOSIS — R488 Other symbolic dysfunctions: Secondary | ICD-10-CM | POA: Diagnosis not present

## 2015-09-19 DIAGNOSIS — R569 Unspecified convulsions: Secondary | ICD-10-CM | POA: Diagnosis not present

## 2015-09-19 DIAGNOSIS — I2510 Atherosclerotic heart disease of native coronary artery without angina pectoris: Secondary | ICD-10-CM | POA: Diagnosis present

## 2015-09-19 DIAGNOSIS — Z79899 Other long term (current) drug therapy: Secondary | ICD-10-CM

## 2015-09-19 DIAGNOSIS — R4182 Altered mental status, unspecified: Secondary | ICD-10-CM | POA: Diagnosis not present

## 2015-09-19 DIAGNOSIS — K219 Gastro-esophageal reflux disease without esophagitis: Secondary | ICD-10-CM | POA: Diagnosis present

## 2015-09-19 DIAGNOSIS — G4700 Insomnia, unspecified: Secondary | ICD-10-CM | POA: Diagnosis not present

## 2015-09-19 DIAGNOSIS — E870 Hyperosmolality and hypernatremia: Secondary | ICD-10-CM | POA: Diagnosis not present

## 2015-09-19 DIAGNOSIS — R197 Diarrhea, unspecified: Secondary | ICD-10-CM | POA: Diagnosis present

## 2015-09-19 DIAGNOSIS — Z87891 Personal history of nicotine dependence: Secondary | ICD-10-CM | POA: Diagnosis not present

## 2015-09-19 DIAGNOSIS — J189 Pneumonia, unspecified organism: Secondary | ICD-10-CM | POA: Diagnosis present

## 2015-09-19 DIAGNOSIS — I739 Peripheral vascular disease, unspecified: Secondary | ICD-10-CM | POA: Diagnosis present

## 2015-09-19 DIAGNOSIS — Z9981 Dependence on supplemental oxygen: Secondary | ICD-10-CM

## 2015-09-19 DIAGNOSIS — I4821 Permanent atrial fibrillation: Secondary | ICD-10-CM

## 2015-09-19 DIAGNOSIS — Z950 Presence of cardiac pacemaker: Secondary | ICD-10-CM | POA: Diagnosis not present

## 2015-09-19 DIAGNOSIS — I4892 Unspecified atrial flutter: Secondary | ICD-10-CM | POA: Diagnosis present

## 2015-09-19 DIAGNOSIS — Z7982 Long term (current) use of aspirin: Secondary | ICD-10-CM

## 2015-09-19 DIAGNOSIS — R531 Weakness: Secondary | ICD-10-CM | POA: Diagnosis not present

## 2015-09-19 DIAGNOSIS — M6281 Muscle weakness (generalized): Secondary | ICD-10-CM | POA: Diagnosis not present

## 2015-09-19 DIAGNOSIS — R410 Disorientation, unspecified: Secondary | ICD-10-CM | POA: Diagnosis present

## 2015-09-19 DIAGNOSIS — N184 Chronic kidney disease, stage 4 (severe): Secondary | ICD-10-CM | POA: Diagnosis not present

## 2015-09-19 DIAGNOSIS — I481 Persistent atrial fibrillation: Secondary | ICD-10-CM | POA: Diagnosis not present

## 2015-09-19 DIAGNOSIS — J9622 Acute and chronic respiratory failure with hypercapnia: Secondary | ICD-10-CM | POA: Diagnosis present

## 2015-09-19 DIAGNOSIS — A419 Sepsis, unspecified organism: Secondary | ICD-10-CM | POA: Diagnosis not present

## 2015-09-19 DIAGNOSIS — J90 Pleural effusion, not elsewhere classified: Secondary | ICD-10-CM | POA: Diagnosis not present

## 2015-09-19 DIAGNOSIS — J441 Chronic obstructive pulmonary disease with (acute) exacerbation: Secondary | ICD-10-CM | POA: Diagnosis not present

## 2015-09-19 DIAGNOSIS — G253 Myoclonus: Secondary | ICD-10-CM | POA: Insufficient documentation

## 2015-09-19 DIAGNOSIS — E785 Hyperlipidemia, unspecified: Secondary | ICD-10-CM | POA: Diagnosis present

## 2015-09-19 DIAGNOSIS — N189 Chronic kidney disease, unspecified: Secondary | ICD-10-CM

## 2015-09-19 DIAGNOSIS — J9621 Acute and chronic respiratory failure with hypoxia: Secondary | ICD-10-CM | POA: Diagnosis present

## 2015-09-19 DIAGNOSIS — R918 Other nonspecific abnormal finding of lung field: Secondary | ICD-10-CM | POA: Diagnosis not present

## 2015-09-19 DIAGNOSIS — R630 Anorexia: Secondary | ICD-10-CM | POA: Diagnosis not present

## 2015-09-19 DIAGNOSIS — R6520 Severe sepsis without septic shock: Secondary | ICD-10-CM | POA: Diagnosis present

## 2015-09-19 DIAGNOSIS — J61 Pneumoconiosis due to asbestos and other mineral fibers: Secondary | ICD-10-CM | POA: Diagnosis not present

## 2015-09-19 DIAGNOSIS — F319 Bipolar disorder, unspecified: Secondary | ICD-10-CM | POA: Diagnosis not present

## 2015-09-19 DIAGNOSIS — I509 Heart failure, unspecified: Secondary | ICD-10-CM | POA: Diagnosis not present

## 2015-09-19 DIAGNOSIS — Z880 Allergy status to penicillin: Secondary | ICD-10-CM

## 2015-09-19 DIAGNOSIS — G9340 Encephalopathy, unspecified: Secondary | ICD-10-CM

## 2015-09-19 DIAGNOSIS — J440 Chronic obstructive pulmonary disease with acute lower respiratory infection: Secondary | ICD-10-CM | POA: Diagnosis present

## 2015-09-19 DIAGNOSIS — Z86718 Personal history of other venous thrombosis and embolism: Secondary | ICD-10-CM | POA: Diagnosis not present

## 2015-09-19 DIAGNOSIS — G9341 Metabolic encephalopathy: Secondary | ICD-10-CM | POA: Diagnosis not present

## 2015-09-19 DIAGNOSIS — N183 Chronic kidney disease, stage 3 unspecified: Secondary | ICD-10-CM | POA: Insufficient documentation

## 2015-09-19 DIAGNOSIS — N432 Other hydrocele: Secondary | ICD-10-CM | POA: Diagnosis not present

## 2015-09-19 DIAGNOSIS — R0600 Dyspnea, unspecified: Secondary | ICD-10-CM | POA: Diagnosis not present

## 2015-09-19 DIAGNOSIS — I482 Chronic atrial fibrillation: Secondary | ICD-10-CM | POA: Diagnosis present

## 2015-09-19 DIAGNOSIS — J9602 Acute respiratory failure with hypercapnia: Secondary | ICD-10-CM | POA: Insufficient documentation

## 2015-09-19 DIAGNOSIS — I130 Hypertensive heart and chronic kidney disease with heart failure and stage 1 through stage 4 chronic kidney disease, or unspecified chronic kidney disease: Secondary | ICD-10-CM | POA: Diagnosis not present

## 2015-09-19 DIAGNOSIS — I4891 Unspecified atrial fibrillation: Secondary | ICD-10-CM | POA: Diagnosis not present

## 2015-09-19 DIAGNOSIS — E0590 Thyrotoxicosis, unspecified without thyrotoxic crisis or storm: Secondary | ICD-10-CM | POA: Diagnosis present

## 2015-09-19 DIAGNOSIS — J129 Viral pneumonia, unspecified: Secondary | ICD-10-CM | POA: Diagnosis not present

## 2015-09-19 DIAGNOSIS — N19 Unspecified kidney failure: Secondary | ICD-10-CM | POA: Diagnosis not present

## 2015-09-19 DIAGNOSIS — E162 Hypoglycemia, unspecified: Secondary | ICD-10-CM | POA: Diagnosis present

## 2015-09-19 DIAGNOSIS — I5023 Acute on chronic systolic (congestive) heart failure: Secondary | ICD-10-CM | POA: Diagnosis not present

## 2015-09-19 LAB — I-STAT VENOUS BLOOD GAS, ED
ACID-BASE EXCESS: 3 mmol/L — AB (ref 0.0–2.0)
BICARBONATE: 31.8 meq/L — AB (ref 20.0–24.0)
O2 SAT: 51 %
PCO2 VEN: 68.5 mmHg — AB (ref 45.0–50.0)
PO2 VEN: 32 mmHg (ref 31.0–45.0)
TCO2: 34 mmol/L (ref 0–100)
pH, Ven: 7.274 (ref 7.250–7.300)

## 2015-09-19 LAB — URINALYSIS, ROUTINE W REFLEX MICROSCOPIC
Bilirubin Urine: NEGATIVE
GLUCOSE, UA: NEGATIVE mg/dL
HGB URINE DIPSTICK: NEGATIVE
Ketones, ur: NEGATIVE mg/dL
Nitrite: NEGATIVE
Protein, ur: NEGATIVE mg/dL
SPECIFIC GRAVITY, URINE: 1.007 (ref 1.005–1.030)
pH: 6.5 (ref 5.0–8.0)

## 2015-09-19 LAB — I-STAT ARTERIAL BLOOD GAS, ED
ACID-BASE EXCESS: 2 mmol/L (ref 0.0–2.0)
Acid-Base Excess: 2 mmol/L (ref 0.0–2.0)
BICARBONATE: 29.5 meq/L — AB (ref 20.0–24.0)
BICARBONATE: 28.7 meq/L — AB (ref 20.0–24.0)
O2 SAT: 98 %
O2 Saturation: 100 %
PH ART: 7.307 — AB (ref 7.350–7.450)
PH ART: 7.32 — AB (ref 7.350–7.450)
TCO2: 31 mmol/L (ref 0–100)
TCO2: 30 mmol/L (ref 0–100)
pCO2 arterial: 59.4 mmHg (ref 35.0–45.0)
pCO2 arterial: 56.1 mmHg — ABNORMAL HIGH (ref 35.0–45.0)
pO2, Arterial: 338 mmHg — ABNORMAL HIGH (ref 80.0–100.0)
pO2, Arterial: 110 mmHg — ABNORMAL HIGH (ref 80.0–100.0)

## 2015-09-19 LAB — CBC WITH DIFFERENTIAL/PLATELET
BASOS PCT: 0 %
Basophils Absolute: 0 10*3/uL (ref 0.0–0.1)
Eosinophils Absolute: 0.2 10*3/uL (ref 0.0–0.7)
Eosinophils Relative: 2 %
HEMATOCRIT: 41.9 % (ref 39.0–52.0)
HEMOGLOBIN: 12.8 g/dL — AB (ref 13.0–17.0)
LYMPHS ABS: 2.1 10*3/uL (ref 0.7–4.0)
Lymphocytes Relative: 19 %
MCH: 28.8 pg (ref 26.0–34.0)
MCHC: 30.5 g/dL (ref 30.0–36.0)
MCV: 94.4 fL (ref 78.0–100.0)
MONOS PCT: 11 %
Monocytes Absolute: 1.2 10*3/uL — ABNORMAL HIGH (ref 0.1–1.0)
NEUTROS ABS: 7.4 10*3/uL (ref 1.7–7.7)
NEUTROS PCT: 68 %
Platelets: 182 10*3/uL (ref 150–400)
RBC: 4.44 MIL/uL (ref 4.22–5.81)
RDW: 14.9 % (ref 11.5–15.5)
WBC: 10.8 10*3/uL — ABNORMAL HIGH (ref 4.0–10.5)

## 2015-09-19 LAB — COMPREHENSIVE METABOLIC PANEL
ALBUMIN: 2.7 g/dL — AB (ref 3.5–5.0)
ALK PHOS: 85 U/L (ref 38–126)
ALT: 46 U/L (ref 17–63)
ANION GAP: 10 (ref 5–15)
AST: 60 U/L — ABNORMAL HIGH (ref 15–41)
BILIRUBIN TOTAL: 0.7 mg/dL (ref 0.3–1.2)
BUN: 21 mg/dL — AB (ref 6–20)
CALCIUM: 9 mg/dL (ref 8.9–10.3)
CO2: 28 mmol/L (ref 22–32)
CREATININE: 1.43 mg/dL — AB (ref 0.61–1.24)
Chloride: 108 mmol/L (ref 101–111)
GFR calc Af Amer: 55 mL/min — ABNORMAL LOW (ref >60)
GFR calc non Af Amer: 48 mL/min — ABNORMAL LOW (ref >60)
GLUCOSE: 108 mg/dL — AB (ref 65–99)
Potassium: 4.3 mmol/L (ref 3.5–5.1)
Sodium: 146 mmol/L — ABNORMAL HIGH (ref 135–145)
TOTAL PROTEIN: 5.8 g/dL — AB (ref 6.5–8.1)

## 2015-09-19 LAB — I-STAT CHEM 8, ED
BUN: 25 mg/dL — ABNORMAL HIGH (ref 6–20)
CALCIUM ION: 1.17 mmol/L (ref 1.13–1.30)
CREATININE: 1.3 mg/dL — AB (ref 0.61–1.24)
Chloride: 103 mmol/L (ref 101–111)
GLUCOSE: 98 mg/dL (ref 65–99)
HCT: 45 % (ref 39.0–52.0)
HEMOGLOBIN: 15.3 g/dL (ref 13.0–17.0)
POTASSIUM: 4.1 mmol/L (ref 3.5–5.1)
Sodium: 147 mmol/L — ABNORMAL HIGH (ref 135–145)
TCO2: 30 mmol/L (ref 0–100)

## 2015-09-19 LAB — I-STAT CG4 LACTIC ACID, ED
LACTIC ACID, VENOUS: 3.31 mmol/L — AB (ref 0.5–2.0)
Lactic Acid, Venous: 0.63 mmol/L (ref 0.5–2.0)

## 2015-09-19 LAB — I-STAT TROPONIN, ED
TROPONIN I, POC: 0.05 ng/mL (ref 0.00–0.08)
Troponin i, poc: 0.05 ng/mL (ref 0.00–0.08)

## 2015-09-19 LAB — CBG MONITORING, ED
GLUCOSE-CAPILLARY: 87 mg/dL (ref 65–99)
GLUCOSE-CAPILLARY: 60 mg/dL — AB (ref 65–99)

## 2015-09-19 LAB — URINE MICROSCOPIC-ADD ON

## 2015-09-19 LAB — LACTIC ACID, PLASMA: LACTIC ACID, VENOUS: 0.9 mmol/L (ref 0.5–2.0)

## 2015-09-19 LAB — DIGOXIN LEVEL: Digoxin Level: 0.4 ng/mL — ABNORMAL LOW (ref 0.8–2.0)

## 2015-09-19 MED ORDER — FUROSEMIDE 10 MG/ML IJ SOLN: 40.0000 mg | Freq: Once | INTRAMUSCULAR | Status: DC

## 2015-09-19 MED ORDER — ASPIRIN 81 MG PO CHEW
81.0000 mg | CHEWABLE_TABLET | Freq: Every day | ORAL | Status: DC
  Administered 2015-09-20 – 2015-09-22 (×3): 81 mg via ORAL
  Filled 2015-09-19 (×3): qty 1

## 2015-09-19 MED ORDER — SODIUM CHLORIDE 0.9 % IV SOLN: 250.0000 mL | INTRAVENOUS | Status: DC | PRN

## 2015-09-19 MED ORDER — HALOPERIDOL LACTATE 5 MG/ML IJ SOLN
2.5000 mg | Freq: Once | INTRAMUSCULAR | Status: AC
Start: 2015-09-19 — End: 2015-09-19
  Administered 2015-09-19: 2.5 mg via INTRAVENOUS
  Filled 2015-09-19: qty 1

## 2015-09-19 MED ORDER — SODIUM CHLORIDE 0.9 % IV BOLUS (SEPSIS)
500.0000 mL | Freq: Once | INTRAVENOUS | Status: AC
  Administered 2015-09-19: 500 mL via INTRAVENOUS

## 2015-09-19 MED ORDER — METRONIDAZOLE IN NACL 5-0.79 MG/ML-% IV SOLN
500.0000 mg | Freq: Once | INTRAVENOUS | Status: AC
  Administered 2015-09-19: 500 mg via INTRAVENOUS
  Filled 2015-09-19: qty 100

## 2015-09-19 MED ORDER — BUDESONIDE 0.5 MG/2ML IN SUSP
0.5000 mg | Freq: Two times a day (BID) | RESPIRATORY_TRACT | Status: DC
  Administered 2015-09-20 – 2015-09-22 (×5): 0.5 mg via RESPIRATORY_TRACT
  Filled 2015-09-19 (×5): qty 2

## 2015-09-19 MED ORDER — CEFEPIME HCL 2 G IJ SOLR
2.0000 g | Freq: Once | INTRAMUSCULAR | Status: AC
  Administered 2015-09-19: 2 g via INTRAVENOUS
  Filled 2015-09-19: qty 2

## 2015-09-19 MED ORDER — PANTOPRAZOLE SODIUM 40 MG PO TBEC
40.0000 mg | DELAYED_RELEASE_TABLET | Freq: Every day | ORAL | Status: DC
  Administered 2015-09-20 – 2015-09-22 (×3): 40 mg via ORAL
  Filled 2015-09-19 (×3): qty 1

## 2015-09-19 MED ORDER — DEXTROSE 50 % IV SOLN
25.0000 mL | Freq: Once | INTRAVENOUS | Status: DC
  Administered 2015-09-19: 25 mL via INTRAVENOUS
  Filled 2015-09-19: qty 50

## 2015-09-19 MED ORDER — DILTIAZEM HCL ER COATED BEADS 120 MG PO CP24
120.0000 mg | ORAL_CAPSULE | Freq: Every day | ORAL | Status: DC
  Administered 2015-09-20 – 2015-09-22 (×3): 120 mg via ORAL
  Filled 2015-09-19 (×4): qty 1

## 2015-09-19 MED ORDER — DIGOXIN 125 MCG PO TABS
0.0625 mg | ORAL_TABLET | Freq: Every day | ORAL | Status: DC
  Administered 2015-09-20 – 2015-09-22 (×3): 0.0625 mg via ORAL
  Filled 2015-09-19 (×5): qty 1

## 2015-09-19 MED ORDER — LORAZEPAM 2 MG/ML IJ SOLN: 1.0000 mg | INTRAMUSCULAR | Status: DC | PRN

## 2015-09-19 MED ORDER — SODIUM CHLORIDE 0.9 % IV SOLN
INTRAVENOUS | Status: DC
  Administered 2015-09-19: 22:00:00 via INTRAVENOUS

## 2015-09-19 MED ORDER — SODIUM CHLORIDE 0.9 % IV SOLN
2000.0000 mg | Freq: Once | INTRAVENOUS | Status: AC
  Administered 2015-09-19: 2000 mg via INTRAVENOUS
  Filled 2015-09-19: qty 2000

## 2015-09-19 MED ORDER — LORAZEPAM 2 MG/ML IJ SOLN
1.0000 mg | Freq: Once | INTRAMUSCULAR | Status: AC
  Administered 2015-09-19: 1 mg via INTRAVENOUS
  Filled 2015-09-19: qty 1

## 2015-09-19 MED ORDER — VANCOMYCIN HCL IN DEXTROSE 1-5 GM/200ML-% IV SOLN
1000.0000 mg | Freq: Two times a day (BID) | INTRAVENOUS | Status: DC
  Administered 2015-09-20 – 2015-09-21 (×3): 1000 mg via INTRAVENOUS
  Filled 2015-09-19 (×5): qty 200

## 2015-09-19 MED ORDER — SODIUM CHLORIDE 0.9 % IV BOLUS (SEPSIS)
1000.0000 mL | Freq: Once | INTRAVENOUS | Status: AC
  Administered 2015-09-19: 1000 mL via INTRAVENOUS

## 2015-09-19 MED ORDER — ROSUVASTATIN CALCIUM 10 MG PO TABS
20.0000 mg | ORAL_TABLET | Freq: Every day | ORAL | Status: DC
  Administered 2015-09-20 – 2015-09-22 (×3): 20 mg via ORAL
  Filled 2015-09-19 (×3): qty 2

## 2015-09-19 MED ORDER — DEXTROSE 5 % IV SOLN
1.0000 g | Freq: Three times a day (TID) | INTRAVENOUS | Status: DC
  Administered 2015-09-20 – 2015-09-22 (×8): 1 g via INTRAVENOUS
  Filled 2015-09-19 (×13): qty 1

## 2015-09-19 MED ORDER — DEXTROSE-NACL 5-0.45 % IV SOLN: INTRAVENOUS | Status: DC

## 2015-09-19 MED ORDER — LEVALBUTEROL HCL 0.63 MG/3ML IN NEBU
0.6300 mg | INHALATION_SOLUTION | Freq: Once | RESPIRATORY_TRACT | Status: AC
  Administered 2015-09-19: 0.63 mg via RESPIRATORY_TRACT
  Filled 2015-09-19: qty 3

## 2015-09-19 MED ORDER — METOPROLOL SUCCINATE ER 100 MG PO TB24
100.0000 mg | ORAL_TABLET | Freq: Every day | ORAL | Status: DC
  Administered 2015-09-20 – 2015-09-22 (×3): 100 mg via ORAL
  Filled 2015-09-19 (×2): qty 1
  Filled 2015-09-19: qty 4

## 2015-09-19 MED ORDER — IPRATROPIUM-ALBUTEROL 0.5-2.5 (3) MG/3ML IN SOLN
3.0000 mL | Freq: Four times a day (QID) | RESPIRATORY_TRACT | Status: DC
  Administered 2015-09-20 – 2015-09-22 (×7): 3 mL via RESPIRATORY_TRACT
  Filled 2015-09-19 (×8): qty 3

## 2015-09-19 MED ORDER — HALOPERIDOL LACTATE 5 MG/ML IJ SOLN: 2.5000 mg | INTRAMUSCULAR | Status: DC | PRN

## 2015-09-19 MED ORDER — HEPARIN SODIUM (PORCINE) 5000 UNIT/ML IJ SOLN: 5000.0000 [IU] | Freq: Three times a day (TID) | INTRAMUSCULAR | Status: DC

## 2015-09-19 NOTE — ED Notes (Signed)
ABG to be recollected prior to admission per Dr.Isaacs

## 2015-09-19 NOTE — ED Provider Notes (Signed)
CSN: [id number]     Arrival date & time 09/19/15  1423 History   None    Chief Complaint  Patient presents with  . Altered Mental Status     (Consider location/radiation/quality/duration/timing/severity/associated sxs/prior Treatment) HPI   70 year old male with past medical history of hypertension, heart failure, bipolar disorder, atrial fibrillation, status post recent admission for pneumonia and atrial fibrillation with rapid ventricular response, who presents with altered mental status for the last 24 hours. Per report from the patient's fiance, who lives with the patient, the patient was discharged from his skilled nursing facility yesterday following a stay after admission in April. He completed a course of antibiotics for pneumonia. He was well and in his usual state of health throughout the day yesterday and "felt great." Upon awakening this morning, the patient was noted to be confused, and only intermittently recognized his fiance which is highly abnormal for him. He was also noticed to be intermittently "jerking" but would remain responsive during these episodes. He was agitated as well per his fiance's report. EMS was subsequently called. On their arrival, the patient was confused with intermittent jerking so 5 mg of Versed was given. This did not improve his jerking. He was subsequently brought to the ED for evaluation. On arrival, patient is confused and unable to provide further history.   Past Medical History  Diagnosis Date  . Hypertension   . Chronic systolic heart failure ([place])     a. NICM - EF 35% with normal cors 2003. b. Last echo 11/2012 - EF 25-30%.  . Depression   . GERD (gastroesophageal reflux disease)   . Portal vein thrombosis   . Avascular necrosis of bones of both hips (HCC)     Status post bilateral hip replacements  . Gastritis   . Alcohol abuse   . Erectile dysfunction   . Bipolar disorder ([place])   . Hydrocele, unspecified   . Lung nodule     Chest CT  scan on 12/14/2008 showed a nodular opacity at the left lung base felt to most likely represent scarring.  Follow-up chest CT scan on 06/02/2010 showed parenchymal scarring in the left apex, left  lower lobe, and lingula; some of this scarring at the left base had a nodular appearance, unchanged. Chest 09/2012 - stable.  . 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  Thyroid scan 08/28/2009 was normal with no focal areas of abnormal increased or decreased activity seen; the uptake of I 131 sodium iodide at 24 hours was 5.7%.  TSH and free T4 normalized by 08/16/2009.  . Intestinal obstruction ([place])   . COPD (chronic obstructive pulmonary disease) ([place])   . E coli bacteremia   . DVT (deep venous thrombosis) ([place])     He has hypercoagulability with multiple prior DVTs  . Pleural plaque     H/o asbestos exposure. Chest CT on 06/02/2010 showed stable extensive calcified pleural plaques involving the left hemithorax, consistent with asbestos related pleural disease.  . Weight loss     Normal colonoscopy by Dr. Olevia Perches on 11/06/2010.  . 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 b. St. Jude gen change 2010.  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  . Noncompliance   . Thrombocytopenia ([place])   . Osteoarthritis   . Spondylosis   . Anxiety   . CKD (chronic  kidney disease)     Renal U/S 12/04/2009 showed no pathological findings. Labs 12/04/2009 include normal ESR, C3, C4; neg ANA; SPEP showed nonspecific increase in the alpha-2 region with no M-spike; UPEP showed no monoclonal free light chains; urine IFE showed polyclonal increase in feree Kappa and/or free Lambda light chains. Baseline Cr reported 1.7-2.5.  . STEMI (ST elevation myocardial infarction) ([place]) 10/11/2014  . Hyperlipemia   . HCAP (healthcare-associated  pneumonia) 08/24/2015  . On home oxygen therapy     "3L periodically" (08/24/2015)   Past Surgical History  Procedure Laterality Date  . Total hip arthroplasty Right 03/08/2008     By Dr. Hiram Comber  . Tee without cardioversion N/A 05/27/2013    Procedure: TRANSESOPHAGEAL ECHOCARDIOGRAM (TEE);  Surgeon: Candee Furbish, MD;  Location: [facility] ENDOSCOPY;  Service: Cardiovascular;  Laterality: N/A;  . Cardioversion N/A 05/27/2013    Procedure: CARDIOVERSION;  Surgeon: Candee Furbish, MD;  Location[facility]COPY;  Service: Cardiovascular;  Laterality: N/A;  . Cardiac catheterization  2003    Nl cors, EF 35%  . Embolectomy Right 12/10/2013    Procedure: Right Femoral Embolectomy; Fasciotomy Right Lower Leg with Intraoperative arteriogram;  Surgeon: Rosetta Posner, MD;  Location: [place];  Service: Vascular;  Laterality: Right;  . Intraoperative arteriogram Right 12/10/2013    Procedure: INTRA OPERATIVE ARTERIOGRAM;  Surgeon: Rosetta Posner, MD;  Location: [place];  Service: Vascular;  Laterality: Right;  . Fasciotomy closure Right 12/15/2013    Procedure: LEG FASCIOTOMY CLOSURE;  Surgeon: Rosetta Posner, MD;  Location: [facility] OR;  Service: Vascular;  Laterality: Right;  . Embolectomy Left 10/12/2014    Procedure: Left Femoral Thrombectomy with intraoperative arteriogram;  Surgeon: Rosetta Posner, MD;  Location: [facility] OR;  Service: Vascular;  Laterality: Left;  . Hemiarthroplasty hip Left 09/09/2002    bipolar; Dr. Lafayette Dragon  . Joint replacement    . Insert / replace / remove pacemaker  02/2003    Dr. Roe Rutherford, [place] pulse generator identity SR model [id number] serial [id number]; gen change by Greggory Brandy  . Insert / replace / remove pacemaker  06/17/2008    Lead and generator change w/ St. Jude Medical Tendril ST model [id number] (serial  [id number]).        Family History  Problem Relation Age of Onset  . Hypertension Mother   . Cancer Brother     Unsure of type.  . Asthma Mother   . Coronary artery disease Mother   . Colon  cancer Neg Hx   . Lung cancer Neg Hx   . Prostate cancer Neg Hx   . Stroke      mother  . Heart attack      brother and sister ?age    Social History  Substance Use Topics  . Smoking status: Former Smoker -- 0.33 packs/day for 54 years    Types: Cigarettes    Quit date: 10/11/2014  . Smokeless tobacco: Never Used  . Alcohol Use: No     Comment: 6/22//2016 "h/o etoh abuse; stopped drinking ~ 2012"    Review of Systems  Unable to perform ROS: Mental status change      Allergies  Amiodarone and Penicillins  Home Medications   Prior to Admission medications   Medication Sig Start Date End Date Taking? Authorizing Provider  albuterol (VENTOLIN HFA) 108 (90 BASE) MCG/ACT inhaler Inhale 2 puffs into the lungs every 6 (six) hours as needed for wheezing or shortness of breath. 01/06/15  Aldine Contes, MD  apixaban (ELIQUIS) 2.5 MG TABS tablet Take 1 tablet (2.5 mg total) by mouth 2 (two) times daily. 08/08/15   Zada Finders, MD  aspirin 81 MG chewable tablet Chew 1 tablet (81 mg total) by mouth daily. 11/17/14   Rushil Sherrye Payor, MD  bisacodyl (DULCOLAX) 10 MG suppository Place 1 suppository (10 mg total) rectally daily as needed for severe constipation. 01/06/15   Nischal Narendra, MD  budesonide (PULMICORT) 0.25 MG/2ML nebulizer solution Take 2 mLs (0.25 mg total) by nebulization 2 (two) times daily. 08/08/15   Zada Finders, MD  buPROPion (WELLBUTRIN) 75 MG tablet Take 1 tablet (75 mg total) by mouth 2 (two) times daily. 01/06/15   Aldine Contes, MD  cholecalciferol (VITAMIN D) 1000 units tablet Take 1,000 Units by mouth daily.    Historical Provider, MD  digoxin 62.5 MCG TABS Take 0.0625 mg by mouth daily. 08/18/15   Zada Finders, MD  diltiazem (CARDIZEM CD) 120 MG 24 hr capsule Take 1 capsule (120 mg total) by mouth daily. 08/19/15   Zada Finders, MD  diltiazem (CARDIZEM) 30 MG tablet Take one tablet by mouth every 4-6 hours as needed for palpitations and or heart rate >100 09/04/15    Thompson Grayer, MD  furosemide (LASIX) 40 MG tablet Take 1 tablet (40 mg total) by mouth daily as needed for edema (for 5 days). 09/04/15   Thompson Grayer, MD  gabapentin (NEURONTIN) 100 MG capsule Take 3 capsules (300 mg total) by mouth 3 (three) times daily. 08/27/15   Carly J Rivet, MD  ipratropium (ATROVENT) 0.02 % nebulizer solution Take 0.5 mg by nebulization 3 (three) times daily.    Historical Provider, MD  levalbuterol Penne Lash) 0.63 MG/3ML nebulizer solution Take 0.63 mg by nebulization 3 (three) times daily.    Historical Provider, MD  metoprolol succinate (TOPROL-XL) 100 MG 24 hr tablet Take 100-200 mg by mouth See admin instructions. Take 2 tablets every morning and take 1 tablet every evening    Historical Provider, MD  metoprolol succinate (TOPROL-XL) 100 MG 24 hr tablet Take 100 mg by mouth daily. Take with or immediately following a meal.    Historical Provider, MD  mirtazapine (REMERON) 15 MG tablet TAKE 1 TABLET BY MOUTH AT BEDTIME 07/25/15   Zada Finders, MD  Multiple Vitamin (MULTIVITAMIN WITH MINERALS) TABS tablet Take 1 tablet by mouth daily.    Historical Provider, MD  Nutritional Supplements (ENSURE CLEAR) LIQD Take 1 each by mouth 3 (three) times daily.    Historical Provider, MD  omeprazole (PRILOSEC) 40 MG capsule Take 1 capsule (40 mg total) by mouth daily. 01/06/15   Aldine Contes, MD  oxyCODONE-acetaminophen (PERCOCET/ROXICET) 5-325 MG tablet Take 1 tablet by mouth every 6 (six) hours as needed for severe pain.    Historical Provider, MD  rosuvastatin (CRESTOR) 20 MG tablet Take 1 tablet (20 mg total) by mouth daily at 6 PM. 01/06/15   Aldine Contes, MD  senna-docusate ([employer] SENNA PLUS) 8.6-50 MG tablet Take 1 tablet by mouth daily as needed for moderate constipation. 08/08/15   Zada Finders, MD  tiotropium (SPIRIVA) 18 MCG inhalation capsule Place 1 capsule (18 mcg total) into inhaler and inhale daily. 01/06/15   Nischal Narendra, MD  Urea 37.5 % CREA Apply 1 application  topically daily as needed (for irritation). 08/08/15   Zada Finders, MD   BP 140/94 mmHg  Pulse 99  Temp(Src) 99.1 F (37.3 C) (Rectal)  Resp 16  Wt 81.647 kg  SpO2 100% Physical  Exam  Constitutional: He appears well-developed and well-nourished.  HENT:  Head: Normocephalic and atraumatic.  Mouth/Throat: No oropharyngeal exudate.  Eyes: Conjunctivae are normal. Pupils are equal, round, and reactive to light.  Neck: Normal range of motion. Neck supple.  Cardiovascular: Normal heart sounds and intact distal pulses.  An irregularly irregular rhythm present. Tachycardia present.  Exam reveals no friction rub.   No murmur heard. Pulmonary/Chest: No accessory muscle usage. Tachypnea noted. He has decreased breath sounds. He has rhonchi in the right lower field and the left lower field. He has no rales.  Abdominal: Soft. Bowel sounds are normal. He exhibits no distension. There is no tenderness.  Musculoskeletal: He exhibits no edema.  Neurological:  Oriented to person only. Moves all extremities with 5 out of 5 strength in upper and lower extremities. Intermittent myoclonic jerking noted versus tremors. No nystagmus. Face is symmetric.  Skin: Skin is warm. No rash noted.  Nursing note and vitals reviewed.   ED Course  Procedures (including critical care time) Labs Review Labs Reviewed  CBC WITH DIFFERENTIAL/PLATELET - Abnormal; Notable for the following:    WBC 10.8 (*)    Hemoglobin 12.8 (*)    Monocytes Absolute 1.2 (*)    All other components within normal limits  COMPREHENSIVE METABOLIC PANEL - Abnormal; Notable for the following:    Sodium 146 (*)    Glucose, Bld 108 (*)    BUN 21 (*)    Creatinine, Ser 1.43 (*)    Total Protein 5.8 (*)    Albumin 2.7 (*)    AST 60 (*)    GFR calc non Af Amer 48 (*)    GFR calc Af Amer 55 (*)    All other components within normal limits  URINALYSIS, ROUTINE W REFLEX MICROSCOPIC (NOT AT [facility]) - Abnormal; Notable for the following:     Leukocytes, UA TRACE (*)    All other components within normal limits  URINE MICROSCOPIC-ADD ON - Abnormal; Notable for the following:    Squamous Epithelial / LPF 0-5 (*)    Bacteria, UA RARE (*)    All other components within normal limits  DIGOXIN LEVEL - Abnormal; Notable for the following:    Digoxin Level 0.4 (*)    All other components within normal limits  I-STAT CHEM 8, ED - Abnormal; Notable for the following:    Sodium 147 (*)    BUN 25 (*)    Creatinine, Ser 1.30 (*)    All other components within normal limits  I-STAT CG4 LACTIC ACID, ED - Abnormal; Notable for the following:    Lactic Acid, Venous 3.31 (*)    All other components within normal limits  I-STAT VENOUS BLOOD GAS, ED - Abnormal; Notable for the following:    pCO2, Ven 68.5 (*)    Bicarbonate 31.8 (*)    Acid-Base Excess 3.0 (*)    All other components within normal limits  I-STAT ARTERIAL BLOOD GAS, ED - Abnormal; Notable for the following:    pH, Arterial 7.307 (*)    pCO2 arterial 59.4 (*)    pO2, Arterial 338.0 (*)    Bicarbonate 29.5 (*)    All other components within normal limits  I-STAT ARTERIAL BLOOD GAS, ED - Abnormal; Notable for the following:    pH, Arterial 7.320 (*)    pCO2 arterial 56.1 (*)    pO2, Arterial 110.0 (*)    Bicarbonate 28.7 (*)    All other components within normal limits  CBG MONITORING,  ED - Abnormal; Notable for the following:    Glucose-Capillary 60 (*)    All other components within normal limits  CULTURE, BLOOD (ROUTINE X 2)  CULTURE, BLOOD (ROUTINE X 2)  URINE CULTURE  CULTURE, EXPECTORATED SPUTUM-ASSESSMENT  C DIFFICILE QUICK SCREEN W PCR REFLEX  LACTIC ACID, PLASMA  LACTIC ACID, PLASMA  AMMONIA  CBC  BASIC METABOLIC PANEL  MAGNESIUM  PHOSPHORUS  PROCALCITONIN  PROCALCITONIN  TSH  APTT  HEPARIN LEVEL (UNFRACTIONATED)  I-STAT TROPOININ, ED  I-STAT CG4 LACTIC ACID, ED  I-STAT TROPOININ, ED  CBG MONITORING, ED  I-STAT TROPOININ, ED    Imaging  Review Ct Head Wo Contrast  09/19/2015  CLINICAL DATA:  Altered mental status EXAM: CT HEAD WITHOUT CONTRAST TECHNIQUE: Contiguous axial images were obtained from the base of the skull through the vertex without intravenous contrast. COMPARISON:  Oct 11, 2014 FINDINGS: There is no midline shift, hydrocephalus, or mass. No acute hemorrhage or acute transcortical infarct is identified. There is chronic diffuse atrophy. Chronic bilateral periventricular white matter small vessel ischemic changes noted. The bony calvarium is intact. The visualized sinuses are clear. IMPRESSION: No focal acute intracranial abnormality identified. Chronic diffuse atrophy. Electronically Signed   By: Abelardo Diesel M.D.   On: 09/19/2015 18:32   Dg Chest Portable 1 View  09/19/2015  CLINICAL DATA:  70 year old male with altered mental status discovered at 1300 hours today. Initial encounter. EXAM: PORTABLE CHEST 1 VIEW COMPARISON:  08/24/2015 and earlier. FINDINGS: Portable AP semi upright view at 1458 hours. Unresolved Patchy and confluent left upper lobe and lung base opacity compared to April. Progressed and now all confluent right lung base opacity obscuring the diaphragm. Superimposed small left pleural effusion suspected. Left fibrothorax demonstrated on chest CT 10/13/2014. Stable cardiomegaly and mediastinal contours. Stable right chest cardiac pacemaker. No superimposed pneumothorax or pulmonary edema. Calcified aortic atherosclerosis. IMPRESSION: 1. Progressed bilateral patchy and confluent airspace opacity since April most suggestive of bilateral pneumonia. Consider aspiration in this setting. 2. 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 Electronically Signed   By: Genevie Ann M.D.   On: 09/19/2015 15:13   I have personally reviewed and evaluated these images and lab results as part of my medical decision-making.   EKG Interpretation   Date/Time:  Tuesday Sep 19 2015 14:33:43 EDT Ventricular Rate:   128 PR Interval:    QRS Duration: 91 QT Interval:  311 QTC Calculation: 454 R Axis:   85 Text Interpretation:  Atrial fibrillation Present 09/04/2015 Borderline  right axis deviation T inversions laterlaly--noted previously Confirmed by  Jeneen Rinks  MD, [place] ([id number]) on 09/19/2015 3:25:57 PM      MDM   70 yo M with PMHx of HTN, CHF, COPD, recent admission for AFIb RVR with PNA who presents with acute onset AMS. See HPI above. On arrival, pt in AFib with RVR, afebrile, with borderline hypotension. Exam as above. Concern for acute delirium, likely 2/2 PNA given hypoxia and lung findings. DDx includes metabolic encephalopathy (such as hyponatremia), hyperammonemia, intracranial hemorrhage. Must also consider seizures given jerking on exam though pt intermittently able to follow commands, suggesting no generalized seizure activity. Will discuss with neuro. Otherwise, will check labs, start code sepsis protocol, and plan for admission.  Imaging shows multifocal PNA with concern for aspiration component. Pt given Vanc/cefepime for HAP, will add flagyl for anaerobic coverage. Otherwise, labs as above. CBC with mild leukocytosis. CMP with hypernatremia, hypoalbuniemia, CKD. Lactate 3.3. - will trend. Pt given 30 cc/kg fluid  with improvement in BP. UA without signs of UTI. Blood gas c/f acute hypercapneic respiratory failure. Will place on BIPAP, monitor closely.  Mentation improved with BIPAP but pt now agitated. Neuro has evaluated, agrees with CT Head but does not feel pt seizing at this time. Haldol/ativan given. Repeat gas improving.  CT head shows NAICA. Consulted IM for admission, but pt noted to be persistently altered despite BIPAP. Likely 2/2 severe sepsis with metabolic encephalopathy. Blood gas improving. Will admit to ICU however given tenuous mental status. CCM admitted and in agreement. BP improved, HR improving with IVF. Pt continued to protect airway at this time.  Clinical Impression: 1.  Severe sepsis ([place])   2. Altered mental status, unspecified altered mental status type   3. Permanent atrial fibrillation ([place])   4. CKD (chronic kidney disease), unspecified stage   5. Atrial fibrillation with RVR ([place])   6. Acute respiratory failure with hypercapnia ([place])     Disposition: Admit  Condition: Fair  Pt seen in conjunction with Dr. Jeneen Rinks.     Duffy Bruce, MD 09/20/15 0005  Tanna Furry, MD 09/20/15 [phone]

## 2015-09-19 NOTE — Progress Notes (Signed)
PA at bedside.

## 2015-09-19 NOTE — Progress Notes (Signed)
Critical value ABG results were given to Dr. Jeneen Rinks.

## 2015-09-19 NOTE — Telephone Encounter (Signed)
APPT. REMINDER CALL, LMTCB °

## 2015-09-19 NOTE — ED Notes (Signed)
[facility] notified @ 1542.

## 2015-09-19 NOTE — Consult Note (Addendum)
NEURO HOSPITALIST CONSULT NOTE      Reason for Consult: called to assess generalized shaking movements for possible seizure   History obtained from:  Chart and ema HPI:                                                                                                                                          [name] is an 70 y.o. male with hx of afib, PM who presents with AMS found to have b/l PNA and a code sepsis was called. Patient is observed to have generalized arm and leg twitches. Called to rule out seizure activity.   Past Medical History  Diagnosis Date  . Hypertension   . Chronic systolic heart failure ([place])     a. NICM - EF 35% with normal cors 2003. b. Last echo 11/2012 - EF 25-30%.  . Depression   . GERD (gastroesophageal reflux disease)   . Portal vein thrombosis   . Avascular necrosis of bones of both hips (H[facility])     Status post bilateral hip replacements  . Gastritis   . Alcohol abuse   . Erectile dysfunction   . Bipolar disorder ([place])   . Hydrocele, unspecified   . Lung nodule     Chest CT scan on 12/14/2008 showed a nodular opacity at the left lung base felt to most likely represent scarring.  Follow-up chest CT scan on 06/02/2010 showed parenchymal scarring in the left apex, left  lower lobe, and lingula; some of this scarring at the left base had a nodular appearance, unchanged. Chest 09/2012 - stable.  . 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  Thyroid scan 08/28/2009 was normal with no focal areas of abnormal increased or decreased activity seen; the uptake of I 131 sodium iodide at 24 hours was 5.7%.  TSH and free T4 normalized by 08/16/2009.  . Intestinal obstruction ([place])   . COPD (chronic obstructive pulmonary disease) ([place])   . E coli bacteremia   . DVT (deep venous thrombosis) ([place])     He has hypercoagulability with multiple prior DVTs  . Pleural plaque     H/o asbestos exposure. Chest CT  on 06/02/2010 showed stable extensive calcified pleural plaques involving the left hemithorax, consistent with asbestos related pleural disease.  . Weight loss     Normal colonoscopy by Dr. Olevia Perches on 11/06/2010.  . Tachycardia-bradycardia syndrome [employer] [facility] [employer])     a. 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 b. St. Jude gen change 2010.  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  . Noncompliance   . Thrombocytopenia ([place])   . Osteoarthritis   . Spondylosis   . Anxiety   .  CKD (chronic kidney disease)     Renal U/S 12/04/2009 showed no pathological findings. Labs 12/04/2009 include normal ESR, C3, C4; neg ANA; SPEP showed nonspecific increase in the alpha-2 region with no M-spike; UPEP showed no monoclonal free light chains; urine IFE showed polyclonal increase in feree Kappa and/or free Lambda light chains. Baseline Cr reported 1.7-2.5.  . STEMI (ST elevation myocardial infarction) ([place]) 10/11/2014  . Hyperlipemia   . HCAP (healthcare-associated pneumonia) 08/24/2015  . On home oxygen therapy     "3L periodically" (08/24/2015)    Past Surgical History  Procedure Laterality Date  . Total hip arthroplasty Right 03/08/2008     By Dr. Hiram Comber  . Tee without cardioversion N/A 05/27/2013    Procedure: TRANSESOPHAGEAL ECHOCARDIOGRAM (TEE);  Surgeon: Candee Furbish, MD;  Location: [facility] ENDOSCOPY;  Service: Cardiovascular;  Laterality: N/A;  . Cardioversion N/A 05/27/2013    Procedure: CARDIOVERSION;  Surgeon: Candee Furbish, MD;  Location: [facility] ENDOSCOPY;  Service: Cardiovascular;  Laterality: N/A;  . Cardiac catheterization  2003    Nl cors, EF 35%  . Embolectomy Right 12/10/2013    Procedure: Right Femoral Embolectomy; Fasciotomy Right Lower Leg with Intraoperative arteriogram;  Surgeon: Rosetta Posner, MD;  Location: [place];  Service: Vascular;  Laterality: Right;  . Intraoperative arteriogram Right 12/10/2013     Procedure: INTRA OPERATIVE ARTERIOGRAM;  Surgeon: Rosetta Posner, MD;  Location: [place];  Service: Vascular;  Laterality: Right;  . Fasciotomy closure Right 12/15/2013    Procedure: LEG FASCIOTOMY CLOSURE;  Surgeon: Rosetta Posner, MD;  Location: [facility] OR;  Service: Vascular;  Laterality: Right;  . Embolectomy Left 10/12/2014    Procedure: Left Femoral Thrombectomy with intraoperative arteriogram;  Surgeon: Rosetta Posner, MD;  Location: [facility] OR;  Service: Vascular;  Laterality: Left;  . Hemiarthroplasty hip Left 09/09/2002    bipolar; Dr. Lafayette Dragon  . Joint replacement    . Insert / replace / remove pacemaker  02/2003    Dr. Roe Rutherford, [place] pulse generator identity SR model [id number] serial [id number]; gen change by Greggory Brandy  . Insert / replace / remove pacemaker  06/17/2008    Lead and generator change w/ St. Jude Medical Tendril ST model [id number] (serial  [id number]).         Family History  Problem Relation Age of Onset  . Hypertension Mother   . Cancer Brother     Unsure of type.  . Asthma Mother   . Coronary artery disease Mother   . Colon cancer Neg Hx   . Lung cancer Neg Hx   . Prostate cancer Neg Hx   . Stroke      mother  . Heart attack      brother and sister ?age       Social History:  reports that he quit smoking about 11 months ago. His smoking use included Cigarettes. He has a 17.82 pack-year smoking history. He has never used smokeless tobacco. He reports that he does not drink alcohol or use illicit drugs.  Allergies  Allergen Reactions  . 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 Penicillins Rash    MEDICATIONS:  I have reviewed the patient's current medications.   ROS:                                                                                                                                       History obtained from  chart review  General ROS: negative for - chills, fatigue, fever, night sweats, weight gain or weight loss Psychological ROS: negative for - behavioral disorder, hallucinations, memory difficulties, mood swings or suicidal ideation Ophthalmic ROS: negative for - blurry vision, double vision, eye pain or loss of vision ENT ROS: negative for - epistaxis, nasal discharge, oral lesions, sore throat, tinnitus or vertigo Allergy and Immunology ROS: negative for - hives or itchy/watery eyes Hematological and Lymphatic ROS: negative for - bleeding problems, bruising or swollen lymph nodes Endocrine ROS: negative for - galactorrhea, hair pattern changes, polydipsia/polyuria or temperature intolerance Respiratory ROS: negative for - cough, hemoptysis, shortness of breath or wheezing Cardiovascular ROS: negative for - chest pain, dyspnea on exertion, edema or irregular heartbeat Gastrointestinal ROS: negative for - abdominal pain, diarrhea, hematemesis, nausea/vomiting or stool incontinence Genito-Urinary ROS: negative for - dysuria, hematuria, incontinence or urinary frequency/urgency Musculoskeletal ROS: negative for - joint swelling or muscular weakness Neurological ROS: as noted in HPI Dermatological ROS: negative for rash and skin lesion changes   Blood pressure 135/98, pulse 127, temperature 99.8 F (37.7 C), temperature source Rectal, resp. rate 24, weight 81.647 kg (180 lb), SpO2 84 %.   Neurologic Examination:                                                                                                      HEENT-  Normocephalic, no lesions, without obvious abnormality.  Normal external eye and conjunctiva.  Normal TM's bilaterally.  Normal auditory canals and external ears. Normal external nose, mucus membranes and septum.  Normal pharynx. Cardiovascular- irregularly irregular rhythm, pulses palpable throughout   Lungs- abnormal lung sounds clear with coughing, Heart exam - S1, S2 normal,  no murmur, no gallop, rate regular Abdomen- soft, non-tender; bowel sounds normal; no masses,  no organomegaly   Neurological Examination Mental Status: Awake. Does open eyes spontaneously, does not follow commands. Does say appropriate things when pain is applied to extremities  Cranial Nerves: II: PERRL III,IV, VI: ptosis not present, extra-ocular motions intact bilaterally V,VII: face appears symmetric   Motor: Right : Upper extremity   3/5    Left:     Upper extremity   3/5, is contracted into a  fist with twitches  Lower extremity   2/5     Lower extremity   2/5 Twitching is observed throughout in all extremities, worse on the L side in arm and leg  Tone and bulk:normal tone throughout; no atrophy noted Sensory: Pinprick and light touch intact throughout, bilaterally       Lab Results: Basic Metabolic Panel:  Recent Labs Lab 09/19/15 1518 09/19/15 1532  NA 146* 147*  K 4.3 4.1  CL 108 103  CO2 28  --   GLUCOSE 108* 98  BUN 21* 25*  CREATININE 1.43* 1.30*  CALCIUM 9.0  --     Liver Function Tests:  Recent Labs Lab 09/19/15 1518  AST 60*  ALT 46  ALKPHOS 85  BILITOT PENDING  PROT 5.8*  ALBUMIN 2.7*   No results for input(s): LIPASE, AMYLASE in the last 168 hours. No results for input(s): AMMONIA in the last 168 hours.  CBC:  Recent Labs Lab 09/19/15 1518 09/19/15 1532  WBC 10.8*  --   NEUTROABS 7.4  --   HGB 12.8* 15.3  HCT 41.9 45.0  MCV 94.4  --   PLT 182  --     Cardiac Enzymes: No results for input(s): CKTOTAL, CKMB, CKMBINDEX, TROPONINI in the last 168 hours.  Lipid Panel: No results for input(s): CHOL, TRIG, HDL, CHOLHDL, VLDL, LDLCALC in the last 168 hours.  CBG: No results for input(s): GLUCAP in the last 168 hours.  Microbiology: Results for orders placed or performed during the hospital encounter of 08/24/15  MRSA PCR Screening     Status: None   Collection Time: 08/24/15  5:43 PM  Result Value Ref Range Status   MRSA by  PCR NEGATIVE NEGATIVE Final    Comment:        The GeneXpert MRSA Assay (FDA approved for NASAL specimens only), is one component of a comprehensive MRSA colonization surveillance program. It is not intended to diagnose MRSA infection nor to guide or monitor treatment for MRSA infections.    *Note: Due to a large number of results and/or encounters for the requested time period, some results have not been displayed. A complete set of results can be found in Results Review.    Coagulation Studies: No results for input(s): LABPROT, INR in the last 72 hours.  Imaging: Dg Chest Portable 1 View  09/19/2015  CLINICAL DATA:  70 year old male with altered mental status discovered at 1300 hours today. Initial encounter. EXAM: PORTABLE CHEST 1 VIEW COMPARISON:  08/24/2015 and earlier. FINDINGS: Portable AP semi upright view at 1458 hours. Unresolved Patchy and confluent left upper lobe and lung base opacity compared to April. Progressed and now all confluent right lung base opacity obscuring the diaphragm. Superimposed small left pleural effusion suspected. Left fibrothorax demonstrated on chest CT 10/13/2014. Stable cardiomegaly and mediastinal contours. Stable right chest cardiac pacemaker. No superimposed pneumothorax or pulmonary edema. Calcified aortic atherosclerosis. IMPRESSION: 1. Progressed bilateral patchy and confluent airspace opacity since April most suggestive of bilateral pneumonia. Consider aspiration in this setting. 2. 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 Electronically Signed   By: Genevie Ann M.D.   On: 09/19/2015 15:13      Assessment/Plan:  70 y.o. male with hx of afib, PM who presents with AMS found to have b/l PNA and a code sepsis was called. Patient is observed to have generalized arm and leg twitches. Called to rule out seizure activity.  - Will need seizure workup - Cant have MRI due  to PM so CTH - EEG - No AED at this time, unclear  because he is responsive through the generalized twitching, this may be rigors related to his sepsis - may also represent a metabolic/toxic type of myoclonus but less likely. May consider checking LFT's, metal screen

## 2015-09-19 NOTE — Progress Notes (Signed)
Pt refused ABG very strongly and became combative and ripped out his IV.  RN and MD made aware.

## 2015-09-19 NOTE — Progress Notes (Signed)
ABG collected  

## 2015-09-19 NOTE — ED Notes (Signed)
RT made aware SpO2 81% with good pleth.  FiO2 increased to100

## 2015-09-19 NOTE — Progress Notes (Addendum)
Pharmacy Antibiotic Note  [name] is a 70 y.o. male admitted on 09/19/2015 with sepsis.  Pharmacy has been consulted for vancomycin and cefepime dosing.  Plan: Vancomycin 2 g x 1 then 1 g  IV every 12 hours.  Goal trough 15-20 mcg/mL. Cefepime 2 g x 1 then 1 g q8h  Weight: 180 lb (81.647 kg)  Temp (24hrs), Avg:99.8 F (37.7 C), Min:99.8 F (37.7 C), Max:99.8 F (37.7 C)   Recent Labs Lab 09/19/15 1518 09/19/15 1532 09/19/15 1533  WBC 10.8*  --   --   CREATININE 1.43* 1.30*  --   LATICACIDVEN  --   --  3.31*    Estimated Creatinine Clearance: 60.2 mL/min (by C-G formula based on Cr of 1.3).    Allergies  Allergen Reactions  . 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 Penicillins Rash    Antimicrobials this admission: Cefepime 5/2>> Vancomycin 5/2>>  Dose adjustments this admission: N/A  Microbiology results: Blood x 2 5/2>> Urine 5/2>>  [name], PharmD, BCPS, [facility] Clinical Pharmacist Pager [phone] 09/19/2015 4:32 PM  ____________________________________________ PCCM ordered zosyn consult for HCAP on admission. Pt has allergy to PCNs with a reaction of rash - spoke with [facility] who ok'd continuing cefepime originally given  [name], PharmD, BCPS, [facility] Clinical Pharmacist Pager [phone] 09/19/2015 10:02 PM

## 2015-09-19 NOTE — ED Notes (Signed)
Dr. Jeneen Rinks MD at bedside speaking with family.

## 2015-09-19 NOTE — ED Notes (Signed)
Neuro MD at bedside

## 2015-09-19 NOTE — Progress Notes (Signed)
Internal medicine teaching service was consulted for admission and came to evaluate the patient at bedside. Initial examination findings were less alert than previously reported, he was partially arousable to painful stimuli but nonverbal and not following any commands. Breath sounds at the lung bases were diminished consistent with radiographic findings. Gag reflex was present, however due to decreased level of arousal there is a concern for airway protection as he would be unable to remove the bipap mask. we recommend PCCM consultation to assess the patient as he may require intubation for airway protection.

## 2015-09-19 NOTE — Progress Notes (Signed)
abg results given to RN 

## 2015-09-19 NOTE — ED Notes (Signed)
Admitting MD at bedside.

## 2015-09-19 NOTE — ED Provider Notes (Deleted)
Presents for evaluation with altered mental status. Care discussed with, and patient evaluated with Dr. Ellender Hose.  Patient recently discharged from significant unit after admission for pneumonia and atrial fibrillation. Arrives here with decreased level of responsiveness. Also was some abnormal movements of right arm and leg.  Thought to perhaps be seizures by paramedics, and given 5 mg IV Versed.  Upon arrival., Patient still with intermittent right arm and leg my clonus. Eyes are closed. Able to respond verbally but confused. We'll follow commands and squeeze both right and left hands.  Tachycardic with A. fib. Afebrile. Elevated lactate. Worsening pneumonia on x-ray. ABG shows acute respiratory failure with PCO2 64. 7.27. Placed on BiPAP. Sugar 85. Given broad-spectrum upon excluding coverage for aspiration.  Agree with fluids, repeat lactate, broad-spectrum ABX, neurology consultation regarding? Seizure. May require ventilatory support.   Tanna Furry, MD 09/19/15 [phone]

## 2015-09-19 NOTE — ED Notes (Signed)
Dr. Ellender Hose MD at bedside.

## 2015-09-19 NOTE — H&P (Signed)
PULMONARY / CRITICAL CARE MEDICINE   Name: [name] MRN: [id number] DOB: 01/05/1946    ADMISSION DATE:  09/19/2015 CONSULTATION DATE:  09/19/15  REFERRING MD:  EDP  CHIEF COMPLAINT:  AMS  HISTORY OF PRESENT ILLNESS:  Pt is encephelopathic; therefore, this HPI is obtained from chart review. [name] is a 70 y.o. male with PMH as outlined below.  He had recent admission 08/12/15 through 08/18/15 for PNA and A.fib with RVR.  He presented to [facility] 09/19/15 with AMS x 24 hours.  Fiance reported that pt was just discharged from SNF 1 day ago after he was sent there following his admission in March.  He had been in his USOH up until roughly 24 hours prior when he was noted to be confused and had intermittent "jerking".  EMS was later called and upon their arrival there was concern for seizures so pt received 5mg  versed.  Jerking continued and pt was brought to ED for further evaluation.  In ED, he was evaluated by neurology who obtained head CT which was negative for acute process.  EGG is pending.  CXR revealed progressive bilateral patchy opacities concerning for PNA.  While in ED, he became agitated while attempting to obtain ABG.  He received 2.5mg  Haldol and 1mg  ativan.  Later, he was minimally responsive so was placed on BiPAP.  ABG was obtained and revealed mild hypercarbia (7.30 / 59 / 338).  Due to concern for deterioration, PCCM was called for admission.  PAST MEDICAL HISTORY :  He  has a past medical history of Hypertension; Chronic systolic heart failure ([place]); Depression; GERD (gastroesophageal reflux disease); Portal vein thrombosis; Avascular necrosis of bones of both hips ([place]); Gastritis; Alcohol abuse; Erectile dysfunction; Bipolar disorder ([place]); Hydrocele, unspecified; Lung nodule; Hyperthyroidism; Intestinal obstruction (HCC); COPD (chronic obstructive pulmonary disease) ([place]); E coli bacteremia; DVT (deep venous thrombosis) ([place]); Pleural plaque; Weight loss;  Tachycardia-bradycardia syndrome ([place]); Thrombosis; Noncompliance; Thrombocytopenia ([place]); Osteoarthritis; Spondylosis; Anxiety; CKD (chronic kidney disease); STEMI (ST elevation myocardial infarction) ([place]) (10/11/2014); Hyperlipemia; HCAP (healthcare-associated pneumonia) (08/24/2015); and On home oxygen therapy.  PAST SURGICAL HISTORY: He  has past surgical history that includes Total hip arthroplasty (Right, 03/08/2008); TEE without cardioversion (N/A, 05/27/2013); Cardioversion (N/A, 05/27/2013); Cardiac catheterization (2003); Embolectomy (Right, 12/10/2013); Intraoperative arteriogram (Right, 12/10/2013); Fasciotomy closure (Right, 12/15/2013); Embolectomy (Left, 10/12/2014); Hemiarthroplasty hip (Left, 09/09/2002); Joint replacement; Insert / replace / remove pacemaker (02/2003); and Insert / replace / remove pacemaker (06/17/2008).  Allergies  Allergen Reactions  . 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 Penicillins Rash    No current facility-administered medications on file prior to encounter.   Current Outpatient Prescriptions on File Prior to Encounter  Medication Sig  . albuterol (VENTOLIN HFA) 108 (90 BASE) MCG/ACT inhaler Inhale 2 puffs into the lungs every 6 (six) hours as needed for wheezing or shortness of breath.  [name] apixaban (ELIQUIS) 2.5 MG TABS tablet Take 1 tablet (2.5 mg total) by mouth 2 (two) times daily.  [name] aspirin 81 MG chewable tablet Chew 1 tablet (81 mg total) by mouth daily.  . bisacodyl (DULCOLAX) 10 MG suppository Place 1 suppository (10 mg total) rectally daily as needed for severe constipation.  . budesonide (PULMICORT) 0.25 MG/2ML nebulizer solution Take 2 mLs (0.25 mg total) by nebulization 2 (two) times daily.  [name] buPROPion (WELLBUTRIN) 75 MG tablet Take 1 tablet (75 mg total) by mouth 2 (two) times daily.  . cholecalciferol (VITAMIN D) 1000 units  tablet Take 1,000 Units by mouth daily.  . digoxin 62.5 MCG TABS Take 0.0625 mg by mouth daily.  [name]  diltiazem (CARDIZEM CD) 120 MG 24 hr capsule Take 1 capsule (120 mg total) by mouth daily.  [name] diltiazem (CARDIZEM) 30 MG tablet Take one tablet by mouth every 4-6 hours as needed for palpitations and or heart rate >100  . furosemide (LASIX) 40 MG tablet Take 1 tablet (40 mg total) by mouth daily as needed for edema (for 5 days).  . gabapentin (NEURONTIN) 100 MG capsule Take 3 capsules (300 mg total) by mouth 3 (three) times daily.  [name] ipratropium (ATROVENT) 0.02 % nebulizer solution Take 0.5 mg by nebulization 3 (three) times daily.  [name] levalbuterol (XOPENEX) 0.63 MG/3ML nebulizer solution Take 0.63 mg by nebulization 3 (three) times daily.  . metoprolol succinate (TOPROL-XL) 100 MG 24 hr tablet Take 100-200 mg by mouth See admin instructions. Take 2 tablets every morning and take 1 tablet every evening  . metoprolol succinate (TOPROL-XL) 100 MG 24 hr tablet Take 100 mg by mouth daily. Take with or immediately following a meal.  . mirtazapine (REMERON) 15 MG tablet TAKE 1 TABLET BY MOUTH AT BEDTIME  . Multiple Vitamin (MULTIVITAMIN WITH MINERALS) TABS tablet Take 1 tablet by mouth daily.  . Nutritional Supplements (ENSURE CLEAR) LIQD Take 1 each by mouth 3 (three) times daily.  [name] omeprazole (PRILOSEC) 40 MG capsule Take 1 capsule (40 mg total) by mouth daily.  [name] oxyCODONE-acetaminophen (PERCOCET/ROXICET) 5-325 MG tablet Take 1 tablet by mouth every 6 (six) hours as needed for severe pain.  . rosuvastatin (CRESTOR) 20 MG tablet Take 1 tablet (20 mg total) by mouth daily at 6 PM.  . senna-docusate ([employer] SENNA PLUS) 8.6-50 MG tablet Take 1 tablet by mouth daily as needed for moderate constipation.  [name] tiotropium (SPIRIVA) 18 MCG inhalation capsule Place 1 capsule (18 mcg total) into inhaler and inhale daily.  . Urea 37.5 % CREA Apply 1 application topically daily as needed (for irritation).    FAMILY HISTORY:  His indicated that his mother is deceased. He indicated that his father is deceased. He  indicated that his brother is alive.   SOCIAL HISTORY: He  reports that he quit smoking about 11 months ago. His smoking use included Cigarettes. He has a 17.82 pack-year smoking history. He has never used smokeless tobacco. He reports that he does not drink alcohol or use illicit drugs.  REVIEW OF SYSTEMS:  Unable to obtain as pt is encephalopathic.  SUBJECTIVE: On BiPAP, opens eyes to sternal rub.  VITAL SIGNS: BP 129/97 mmHg  Pulse 99  Temp(Src) 99.1 F (37.3 C) (Rectal)  Resp 18  Wt 180 lb (81.647 kg)  SpO2 100%  HEMODYNAMICS:    VENTILATOR SETTINGS: Vent Mode:  [-]  FiO2 (%):  [40 %] 40 %  INTAKE / OUTPUT: I/O last 3 completed shifts: In: 2500 [I.V.:2500] Out: -    PHYSICAL EXAMINATION: General: Adult AA male, minimally responsive. Neuro: Minimally responsive.  Opens eyes to noxious stimuli, does not answer questions. HEENT: [place]/AT. PERRL, sclerae anicteric. Cardiovascular: RRR, no M/R/G.  Lungs: Respirations even and unlabored.  Coarse bilateral bases. Abdomen: BS x 4, soft, NT/ND.  Musculoskeletal: No gross deformities, no edema.  Skin: Intact, warm, no rashes.  LABS:  BMET  Recent Labs Lab 09/19/15 1518 09/19/15 1532  NA 146* 147*  K 4.3 4.1  CL 108 103  CO2 28  --   BUN 21* 25*  CREATININE 1.43*  1.30*  GLUCOSE 108* 98    Electrolytes  Recent Labs Lab 09/19/15 1518  CALCIUM 9.0    CBC  Recent Labs Lab 09/19/15 1518 09/19/15 1532  WBC 10.8*  --   HGB 12.8* 15.3  HCT 41.9 45.0  PLT 182  --     Coag's No results for input(s): APTT, INR in the last 168 hours.  Sepsis Markers  Recent Labs Lab 09/19/15 1533 09/19/15 1841 09/19/15 1851  LATICACIDVEN 3.31* 0.9 0.63    ABG  Recent Labs Lab 09/19/15 1758 09/19/15 2105  PHART 7.307* 7.320*  PCO2ART 59.4* 56.1*  PO2ART 338.0* 110.0*    Liver Enzymes  Recent Labs Lab 09/19/15 1518  AST 60*  ALT 46  ALKPHOS 85  BILITOT 0.7  ALBUMIN 2.7*    Cardiac Enzymes No  results for input(s): TROPONINI, PROBNP in the last 168 hours.  Glucose  Recent Labs Lab 09/19/15 1556  GLUCAP 87    Imaging Ct Head Wo Contrast  09/19/2015  CLINICAL DATA:  Altered mental status EXAM: CT HEAD WITHOUT CONTRAST TECHNIQUE: Contiguous axial images were obtained from the base of the skull through the vertex without intravenous contrast. COMPARISON:  Oct 11, 2014 FINDINGS: There is no midline shift, hydrocephalus, or mass. No acute hemorrhage or acute transcortical infarct is identified. There is chronic diffuse atrophy. Chronic bilateral periventricular white matter small vessel ischemic changes noted. The bony calvarium is intact. The visualized sinuses are clear. IMPRESSION: No focal acute intracranial abnormality identified. Chronic diffuse atrophy. Electronically Signed   By: Abelardo Diesel M.D.   On: 09/19/2015 18:32   Dg Chest Portable 1 View  09/19/2015  CLINICAL DATA:  70 year old male with altered mental status discovered at 1300 hours today. Initial encounter. EXAM: PORTABLE CHEST 1 VIEW COMPARISON:  08/24/2015 and earlier. FINDINGS: Portable AP semi upright view at 1458 hours. Unresolved Patchy and confluent left upper lobe and lung base opacity compared to April. Progressed and now all confluent right lung base opacity obscuring the diaphragm. Superimposed small left pleural effusion suspected. Left fibrothorax demonstrated on chest CT 10/13/2014. Stable cardiomegaly and mediastinal contours. Stable right chest cardiac pacemaker. No superimposed pneumothorax or pulmonary edema. Calcified aortic atherosclerosis. IMPRESSION: 1. Progressed bilateral patchy and confluent airspace opacity since April most suggestive of bilateral pneumonia. Consider aspiration in this setting. 2. 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 Electronically Signed   By: Genevie Ann M.D.   On: 09/19/2015 15:13     STUDIES:  CT head 05/02 > No acute process. CXR 05/02 >  progressive bilateral patchy opacities concerning for PNA.  CULTURES: Blood 05/02 > Urine 05/02 > Sputum 05/02 >  ANTIBIOTICS: Vanc 05/02 > Cefepime 05/02 >  SIGNIFICANT EVENTS: 05/02 > admitted with AMS likely due to hypercapnia, worsened after administration of haldol and ativan for agitation.  LINES/TUBES: None.  DISCUSSION: 70 y.o. M recently admitted in March for PNA and AFRVR, admitted 05/02 with AMS.  Became agitated in ED so received haldol and ativan.  Later had decline in mental status and ABG revealed hypercapnic respiratory failure.  He was placed on BiPAP and PCCM was called for admission.  ASSESSMENT / PLAN:  PULMONARY A: Acute hypercapnic respiratory failure - likely exacerbated after receiving haldol and ativan. Concern for HCAP. Hx chronic left fibrothorax, COPD. P:   Continue BiPAP for now. If deteriorates, will require intubation. Abx / cultures per ID section. DuoNebs / Budesonide / Albuterol PRN. CXR in AM.  CARDIOVASCULAR A:  Hx A.fib,  sCHF (echo from March 2017 with EF 30%, diffuse hypokinesis), tachy-brady syndrome (s/p PPM Oct 2004),STEMI, HLD. P:  Heparin gtt in lieu of outpatient apixaban in case any procedures needed. If able to take PO by AM, then continue outpatient digoxin, diltiazem, toprol-xl (dose adjusted), rosuvastatin.  RENAL A:   CKD. Mild hypernatremia. P:   NS @ 50. BMP in AM.  GASTROINTESTINAL A:   GERD. Nutrition. P:   Continue outpatient PPI. NPO for now.  HEMATOLOGIC A:   VTE Prophylaxis. Hx DVT. P:  SCD's / Heparin. CBC in AM.  INFECTIOUS A:   Concern for HCAP. Hx E.coli bacteremia. P:   Abx as above (vanc / cefepime).  Follow cultures as above. PCT algorithm to limit abx exposure.  ENDOCRINE A:   Hx hyperthyroidism.   P:   Assess TSH.  NEUROLOGIC A:   AMS - likely due to hypercapnia. Hx anxiety. P:   Assess ammonia. Hold outpatient wellbutrin, mirtazapine, percocet.   Family updated:  None.  Interdisciplinary Family Meeting v Palliative Care Meeting:  Due by: 09/25/15  CC time: 40 minutes.   Montey Hora, [facility] Pager: [phone]  or [phone] 09/19/2015, 9:58 PM

## 2015-09-19 NOTE — ED Provider Notes (Signed)
Patient seen and evaluated upon arrival with Dr. Ellender Hose.  Recent pneumonia. Progressing on evaluation today. Becomes agitated and required sedation. Elevated lactate. Given sepsis fluids and antibiotics. Has right upper and lower extremity tremor but remains functional and able to follow commands, doubt seizure. CT normal. See neurology consultation. Patient admitted. On BiPAP. Initial gas shows rest for a insufficiency, improving. Does not require ventilatory support outside of BiPAP at this time.  CRITICAL CARE Performed by: Tanna Furry JOSEPH   Total critical care time: 60 minutes  Critical care time was exclusive of separately billable procedures and treating other patients.  Critical care was necessary to treat or prevent imminent or life-threatening deterioration.  Critical care was time spent personally by me on the following activities: development of treatment plan with patient and/or surrogate as well as nursing, discussions with consultants, evaluation of patient's response to treatment, examination of patient, obtaining history from patient or surrogate, ordering and performing treatments and interventions, ordering and review of laboratory studies, ordering and review of radiographic studies, pulse oximetry and re-evaluation of patient's condition.   Tanna Furry, MD 09/19/15 2005

## 2015-09-19 NOTE — ED Notes (Signed)
Pt to go to ICU per Dr.Isaacs, [place]

## 2015-09-19 NOTE — ED Notes (Signed)
Pt arrives via [facility] from home. EMS reports home health aide called out at 1300 for AMS.  Pt "not very responsive" per EMS, then at 1320 pt began having what EMS describes as "full body clonic tonic" movements.  EMS reports pt became more alert at this point, recognizing family members.  EMS reports giving 5 mg Versed.  Pt responds to painful stimuli upon arrival.

## 2015-09-19 NOTE — ED Notes (Signed)
While this RN was hanging antibiotics pt became restless, attempting to remove IV's and all leads.  Pt removed diaper, urinated and had small bowel movement.  Pt proceded to contaminate wires and IV tubing with feces.  Pt was cleaned, linen changed.  THis resulted on a delay in starting antibiotics.

## 2015-09-20 ENCOUNTER — Ambulatory Visit: Payer: Commercial Managed Care - HMO | Admitting: Internal Medicine

## 2015-09-20 ENCOUNTER — Inpatient Hospital Stay (HOSPITAL_COMMUNITY): Payer: Commercial Managed Care - HMO

## 2015-09-20 ENCOUNTER — Encounter (HOSPITAL_COMMUNITY): Payer: Self-pay | Admitting: *Deleted

## 2015-09-20 DIAGNOSIS — J9622 Acute and chronic respiratory failure with hypercapnia: Secondary | ICD-10-CM

## 2015-09-20 DIAGNOSIS — J441 Chronic obstructive pulmonary disease with (acute) exacerbation: Secondary | ICD-10-CM

## 2015-09-20 LAB — PROCALCITONIN
Procalcitonin: <0.1 ng/mL
Procalcitonin: <0.1 ng/mL

## 2015-09-20 LAB — C DIFFICILE QUICK SCREEN W PCR REFLEX
C DIFFICILE (CDIFF) INTERP: NEGATIVE
C DIFFICILE (CDIFF) TOXIN: NEGATIVE
C Diff antigen: NEGATIVE

## 2015-09-20 LAB — GLUCOSE, CAPILLARY
GLUCOSE-CAPILLARY: 82 mg/dL (ref 65–99)
GLUCOSE-CAPILLARY: 96 mg/dL (ref 65–99)
Glucose-Capillary: 83 mg/dL (ref 65–99)
Glucose-Capillary: 85 mg/dL (ref 65–99)
Glucose-Capillary: 119 mg/dL — ABNORMAL HIGH (ref 65–99)

## 2015-09-20 LAB — CBC
HCT: 39.6 % (ref 39.0–52.0)
Hemoglobin: 12.3 g/dL — ABNORMAL LOW (ref 13.0–17.0)
MCH: 29.4 pg (ref 26.0–34.0)
MCHC: 31.1 g/dL (ref 30.0–36.0)
MCV: 94.5 fL (ref 78.0–100.0)
PLATELETS: 165 10*3/uL (ref 150–400)
RBC: 4.19 MIL/uL — ABNORMAL LOW (ref 4.22–5.81)
RDW: 15 % (ref 11.5–15.5)
WBC: 9.7 10*3/uL (ref 4.0–10.5)

## 2015-09-20 LAB — PHOSPHORUS: PHOSPHORUS: 3.5 mg/dL (ref 2.5–4.6)

## 2015-09-20 LAB — HEPARIN LEVEL (UNFRACTIONATED)
Heparin Unfractionated: >2.2 IU/mL — ABNORMAL HIGH (ref 0.30–0.70)
Heparin Unfractionated: 2.2 IU/mL — ABNORMAL HIGH (ref 0.30–0.70)

## 2015-09-20 LAB — I-STAT ARTERIAL BLOOD GAS, ED
Acid-Base Excess: 3 mmol/L — ABNORMAL HIGH (ref 0.0–2.0)
BICARBONATE: 28.4 meq/L — AB (ref 20.0–24.0)
O2 Saturation: 98 %
PCO2 ART: 47.2 mmHg — AB (ref 35.0–45.0)
PH ART: 7.388 (ref 7.350–7.450)
Patient temperature: 99.1
TCO2: 30 mmol/L (ref 0–100)
pO2, Arterial: 110 mmHg — ABNORMAL HIGH (ref 80.0–100.0)

## 2015-09-20 LAB — BASIC METABOLIC PANEL
ANION GAP: 7 (ref 5–15)
BUN: 19 mg/dL (ref 6–20)
CALCIUM: 8.5 mg/dL — AB (ref 8.9–10.3)
CO2: 28 mmol/L (ref 22–32)
Chloride: 111 mmol/L (ref 101–111)
Creatinine, Ser: 1.24 mg/dL (ref 0.61–1.24)
GFR calc Af Amer: >60 mL/min (ref >60)
GFR, EST NON AFRICAN AMERICAN: 57 mL/min — AB (ref >60)
GLUCOSE: 92 mg/dL (ref 65–99)
Potassium: 4 mmol/L (ref 3.5–5.1)
SODIUM: 146 mmol/L — AB (ref 135–145)

## 2015-09-20 LAB — APTT
APTT: 32 s (ref 24–37)
APTT: 86 s — AB (ref 24–37)
APTT: 93 s — AB (ref 24–37)

## 2015-09-20 LAB — CBG MONITORING, ED
GLUCOSE-CAPILLARY: 85 mg/dL (ref 65–99)
GLUCOSE-CAPILLARY: 87 mg/dL (ref 65–99)
Glucose-Capillary: 67 mg/dL (ref 65–99)

## 2015-09-20 LAB — AMMONIA: AMMONIA: 53 umol/L — AB (ref 9–35)

## 2015-09-20 LAB — TSH: TSH: 3.273 u[IU]/mL (ref 0.350–4.500)

## 2015-09-20 LAB — MAGNESIUM: MAGNESIUM: 1.4 mg/dL — AB (ref 1.7–2.4)

## 2015-09-20 MED ORDER — LEVALBUTEROL HCL 0.63 MG/3ML IN NEBU
0.6300 mg | INHALATION_SOLUTION | Freq: Four times a day (QID) | RESPIRATORY_TRACT | Status: DC | PRN
  Administered 2015-09-22: 0.63 mg via RESPIRATORY_TRACT
  Filled 2015-09-20: qty 3

## 2015-09-20 MED ORDER — BUPROPION HCL 75 MG PO TABS
75.0000 mg | ORAL_TABLET | Freq: Two times a day (BID) | ORAL | Status: DC
  Administered 2015-09-20 – 2015-09-22 (×6): 75 mg via ORAL
  Filled 2015-09-20 (×7): qty 1

## 2015-09-20 MED ORDER — DEXTROSE-NACL 5-0.45 % IV SOLN
INTRAVENOUS | Status: DC
Start: 2015-09-20 — End: 2015-09-21
  Administered 2015-09-20 – 2015-09-21 (×2): via INTRAVENOUS

## 2015-09-20 MED ORDER — BUDESONIDE 0.25 MG/2ML IN SUSP: 0.5000 mg | Freq: Two times a day (BID) | RESPIRATORY_TRACT | Status: DC

## 2015-09-20 MED ORDER — MIRTAZAPINE 15 MG PO TABS
15.0000 mg | ORAL_TABLET | Freq: Every day | ORAL | Status: DC
  Administered 2015-09-20 – 2015-09-21 (×2): 15 mg via ORAL
  Filled 2015-09-20 (×3): qty 1

## 2015-09-20 MED ORDER — INSULIN ASPART 100 UNIT/ML ~~LOC~~ SOLN
0.0000 [IU] | SUBCUTANEOUS | Status: DC
  Administered 2015-09-21: 2 [IU] via SUBCUTANEOUS
  Administered 2015-09-21: 1 [IU] via SUBCUTANEOUS

## 2015-09-20 MED ORDER — CETYLPYRIDINIUM CHLORIDE 0.05 % MT LIQD
7.0000 mL | Freq: Two times a day (BID) | OROMUCOSAL | Status: DC
  Administered 2015-09-20 – 2015-09-22 (×5): 7 mL via OROMUCOSAL

## 2015-09-20 MED ORDER — IBUPROFEN 200 MG PO TABS
400.0000 mg | ORAL_TABLET | Freq: Four times a day (QID) | ORAL | Status: DC | PRN
  Administered 2015-09-20 – 2015-09-21 (×2): 400 mg via ORAL
  Filled 2015-09-20 (×2): qty 2

## 2015-09-20 MED ORDER — HEPARIN (PORCINE) IN NACL 100-0.45 UNIT/ML-% IJ SOLN
1100.0000 [IU]/h | INTRAMUSCULAR | Status: DC
  Administered 2015-09-20 (×2): 1200 [IU]/h via INTRAVENOUS
  Filled 2015-09-20 (×3): qty 250

## 2015-09-20 NOTE — Progress Notes (Signed)
[facility] Progress Note Patient Name: [name] DOB: January 20, 1946 MRN: [id number]   Date of Service  09/20/2015  HPI/Events of Note  Contacted by ED nurse regarding persistent hypoglycemia despite bolus dextrose. Currently receiving normal saline at 50 mL per hour maintenance IV fluid.  eICU Interventions  1. Switch to D5 half-normal saline at 50 mL per hour 2. Accu-Cheks every 4 hours     Intervention Category Major Interventions: Other:  Tera Partridge 09/20/2015, 1:00 AM

## 2015-09-20 NOTE — ED Notes (Signed)
Attempted report x1. 

## 2015-09-20 NOTE — ED Notes (Signed)
Pt more alert at this time, attempting to pull bi-pap mask off. RT made aware.

## 2015-09-20 NOTE — Progress Notes (Signed)
Pt is off NIV at this time, pt titrated to a [place]. No distress or complications noted.

## 2015-09-20 NOTE — ED Notes (Signed)
RT at bedside; bi-pap removed; pt cursing at staff

## 2015-09-20 NOTE — Progress Notes (Signed)
[place] for Heparin Indication: atrial fibrillation  Allergies  Allergen Reactions  . Amiodarone Other (See Comments)    Thyroiditis in the setting of amiodarone use  . Penicillins Rash    Patient Measurements: Height: '6\' 3"'  (190.5 cm) Weight: 180 lb (81.647 kg) IBW/kg (Calculated) : 84.5  Vital Signs: Temp: 97.9 F (36.6 C) (05/03 1700) Temp Source: Oral (05/03 1700) BP: 118/88 mmHg (05/03 1700) Pulse Rate: 66 (05/03 1700)  Labs:  Recent Labs  09/19/15 1518 09/19/15 1532 09/19/15 2356 09/20/15 0325 09/20/15 1110 09/20/15 1851  HGB 12.8* 15.3  --  12.3*  --   --   HCT 41.9 45.0  --  39.6  --   --   PLT 182  --   --  165  --   --   APTT  --   --  32  --  93* 86*  HEPARINUNFRC  --   --  >2.20*  --  2.20*  --   CREATININE 1.43* 1.30*  --  1.24  --   --     Estimated Creatinine Clearance: 63.1 mL/min (by C-G formula based on Cr of 1.24).   Medical History: Past Medical History  Diagnosis Date  . Hypertension   . Chronic systolic heart failure ([place])     a. NICM - EF 35% with normal cors 2003. b. Last echo 11/2012 - EF 25-30%.  . Depression   . GERD (gastroesophageal reflux disease)   . Portal vein thrombosis   . Avascular necrosis of bones of both hips ([facility])     Status post bilateral hip replacements  . Gastritis   . Alcohol abuse   . Erectile dysfunction   . Bipolar disorder ([place])   . Hydrocele, unspecified   . Lung nodule     Chest CT scan on 12/14/2008 showed a nodular opacity at the left lung base felt to most likely represent scarring.  Follow-up chest CT scan on 06/02/2010 showed parenchymal scarring in the left apex, left  lower lobe, and lingula; some of this scarring at the left base had a nodular appearance, unchanged. Chest 09/2012 - stable.  . 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  Thyroid scan 08/28/2009 was normal with no focal areas of abnormal increased or  decreased activity seen; the uptake of I 131 sodium iodide at 24 hours was 5.7%.  TSH and free T4 normalized by 08/16/2009.  . Intestinal obstruction ([place])   . COPD (chronic obstructive pulmonary disease) ([place])   . E coli bacteremia   . DVT (deep venous thrombosis) ([place])     He has hypercoagulability with multiple prior DVTs  . Pleural plaque     H/o asbestos exposure. Chest CT on 06/02/2010 showed stable extensive calcified pleural plaques involving the left hemithorax, consistent with asbestos related pleural disease.  . Weight loss     Normal colonoscopy by Dr. Olevia Perches on 11/06/2010.  . Tachycardia-bradycardia syndrome [facility])     a. 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 b. [employer] gen change 2010.  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  . Noncompliance   . Thrombocytopenia ([place])   . Osteoarthritis   . Spondylosis   . Anxiety   . CKD (chronic kidney disease)     Renal U/S 12/04/2009 showed no pathological findings. Labs 12/04/2009 include normal ESR, C3, C4; neg ANA;  SPEP showed nonspecific increase in the alpha-2 region with no M-spike; UPEP showed no monoclonal free light chains; urine IFE showed polyclonal increase in feree Kappa and/or free Lambda light chains. Baseline Cr reported 1.7-2.5.  . STEMI (ST elevation myocardial infarction) ([place]) 10/11/2014  . Hyperlipemia   . HCAP (healthcare-associated pneumonia) 08/24/2015  . On home oxygen therapy     "3L periodically" (08/24/2015)    Assessment: 70 y.o. male admitted with AMS/respiratory failure, h/o of Afib on Eliquis. Now switched to IV heparin for possible procedures. Heparin remains falsely elevated at 2.2, confirmatory aPTT 86 on heparin infusion at 1200 units/hr. May not have any procedures so might switch back on 5/4. Hgb 12.3, plts wnl.  Goal of Therapy:  APTT 66-102 while Eliquis interfering with anti-Xa level Heparin level  0.3-0.7 units/ml Monitor platelets by anticoagulation protocol: Yes   Plan:  1. Continue heparin gtt at 1,200 units/hr 2. Monitor daily aPTT / HL, CBC, s/s of bleed  Vincenza Hews, PharmD, BCPS 09/20/2015, 7:41 PM Pager: [phone]

## 2015-09-20 NOTE — Progress Notes (Signed)
PULMONARY / CRITICAL CARE MEDICINE   Name: [name] MRN: [id number] DOB: Aug 11, 1945    ADMISSION DATE:  09/19/2015 CONSULTATION DATE:  09/19/15  REFERRING MD:  EDP  CHIEF COMPLAINT:  AMS   SUBJECTIVE:  RN reports improvement in mental status - less spitting, yelling.  Pt c/o SOB and hunger.  No acute events.   VITAL SIGNS: BP 133/98 mmHg  Pulse 75  Temp(Src) 96.3 F (35.7 C) (Oral)  Resp 20  Wt 180 lb (81.647 kg)  SpO2 100%  HEMODYNAMICS:    VENTILATOR SETTINGS: Vent Mode:  [-]  FiO2 (%):  [40 %] 40 %  INTAKE / OUTPUT: I/O last 3 completed shifts: In: 2500 [I.V.:2500] Out: 300 [Urine:300]   PHYSICAL EXAMINATION: General: Adult AA male, no acute distress Neuro: opens eyes to voice, oriented, speech clear, MAE HEENT: [place]/AT. PERRL, sclerae anicteric. Cardiovascular: RRR, no M/R/G.  Lungs: Respirations even and unlabored.  Diminished bibasilar Abdomen: BS x 4, soft, NT/ND.  Musculoskeletal: No gross deformities, no edema.  Skin: Intact, warm, no rashes.   LABS:  BMET  Recent Labs Lab 09/19/15 1518 09/19/15 1532 09/20/15 0325  NA 146* 147* 146*  K 4.3 4.1 4.0  CL 108 103 111  CO2 28  --  28  BUN 21* 25* 19  CREATININE 1.43* 1.30* 1.24  GLUCOSE 108* 98 92    Electrolytes  Recent Labs Lab 09/19/15 1518 09/20/15 0325  CALCIUM 9.0 8.5*  MG  --  1.4*  PHOS  --  3.5    CBC  Recent Labs Lab 09/19/15 1518 09/19/15 1532 09/20/15 0325  WBC 10.8*  --  9.7  HGB 12.8* 15.3 12.3*  HCT 41.9 45.0 39.6  PLT 182  --  165    Coag's  Recent Labs Lab 09/19/15 2356  APTT 32    Sepsis Markers  Recent Labs Lab 09/19/15 1533 09/19/15 1841 09/19/15 1851 09/19/15 2356 09/20/15 0325  LATICACIDVEN 3.31* 0.9 0.63  --   --   PROCALCITON  --   --   --  <0.10 <0.10    ABG  Recent Labs Lab 09/19/15 1758 09/19/15 2105 09/20/15 0021  PHART 7.307* 7.320* 7.388  PCO2ART 59.4* 56.1* 47.2*  PO2ART 338.0* 110.0* 110.0*    Liver  Enzymes  Recent Labs Lab 09/19/15 1518  AST 60*  ALT 46  ALKPHOS 85  BILITOT 0.7  ALBUMIN 2.7*    Cardiac Enzymes No results for input(s): TROPONINI, PROBNP in the last 168 hours.  Glucose  Recent Labs Lab 09/19/15 1556 09/19/15 2136 09/20/15 0037 09/20/15 0409 09/20/15 0648 09/20/15 0933  GLUCAP 87 60* 67 85 87 82    Imaging Ct Head Wo Contrast  09/19/2015  CLINICAL DATA:  Altered mental status EXAM: CT HEAD WITHOUT CONTRAST TECHNIQUE: Contiguous axial images were obtained from the base of the skull through the vertex without intravenous contrast. COMPARISON:  Oct 11, 2014 FINDINGS: There is no midline shift, hydrocephalus, or mass. No acute hemorrhage or acute transcortical infarct is identified. There is chronic diffuse atrophy. Chronic bilateral periventricular white matter small vessel ischemic changes noted. The bony calvarium is intact. The visualized sinuses are clear. IMPRESSION: No focal acute intracranial abnormality identified. Chronic diffuse atrophy. Electronically Signed   By: Abelardo Diesel M.D.   On: 09/19/2015 18:32   Dg Chest Port 1 View  09/20/2015  CLINICAL DATA:  Initial evaluation for acute respiratory failure. EXAM: PORTABLE CHEST 1 VIEW COMPARISON:  Prior radiograph from 09/19/2015. FINDINGS: Cardiomegaly unchanged. Mediastinal silhouette within  normal limits. Atheromatous plaque within the aortic arch. Right-sided pacemaker/ AICD again noted. Similar low lung volumes. Probable left pleural effusion. Persistent patchy and confluent left upper and lower lobe opacity, slightly progressed from prior. Improved aeration of the right lung base with improved right basilar opacity. Similar left fibrothorax. No pneumothorax identified. Mild vascular congestion without pulmonary edema. Osseous structures unchanged. IMPRESSION: 1. Slight interval worsening and patchy and confluent opacity involving the left upper and lower lobes as compared to prior radiograph from  09/19/2015. 2. Improved aeration of the right lung base with improved right basilar opacity. 3. Similar left fibrothorax. Persistent suspected small left pleural effusion. Electronically Signed   By: Jeannine Boga M.D.   On: 09/20/2015 05:39   Dg Chest Portable 1 View  09/19/2015  CLINICAL DATA:  70 year old male with altered mental status discovered at 1300 hours today. Initial encounter. EXAM: PORTABLE CHEST 1 VIEW COMPARISON:  08/24/2015 and earlier. FINDINGS: Portable AP semi upright view at 1458 hours. Unresolved Patchy and confluent left upper lobe and lung base opacity compared to April. Progressed and now all confluent right lung base opacity obscuring the diaphragm. Superimposed small left pleural effusion suspected. Left fibrothorax demonstrated on chest CT 10/13/2014. Stable cardiomegaly and mediastinal contours. Stable right chest cardiac pacemaker. No superimposed pneumothorax or pulmonary edema. Calcified aortic atherosclerosis. IMPRESSION: 1. Progressed bilateral patchy and confluent airspace opacity since April most suggestive of bilateral pneumonia. Consider aspiration in this setting. 2. 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 Electronically Signed   By: Genevie Ann M.D.   On: 09/19/2015 15:13     STUDIES:  CT head 5/02 > No acute process. CXR 5/02 > progressive bilateral patchy opacities concerning for PNA.  CULTURES: Blood 5/02 >> Urine 5/02 >> Sputum 5/02 >> C-Diff 5/3 >> negative   ANTIBIOTICS: Vanc 05/02 >> Cefepime 05/02 >>  SIGNIFICANT EVENTS: 05/02  Admitted with AMS likely due to hypercapnia, worsened after administration of haldol and ativan for agitation.  LINES/TUBES: None.  DISCUSSION: 70 y.o. M recently admitted in March for PNA and AFRVR, admitted 05/02 with AMS.  Became agitated in ED so received haldol and ativan.  Later had decline in mental status and ABG revealed hypercapnic respiratory failure.  He was placed on BiPAP  and PCCM was called for admission.  ASSESSMENT / PLAN:  PULMONARY A: Acute hypercapnic respiratory failure - likely exacerbated after receiving haldol and ativan. Concern for HCAP. 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 P:   PRN BiPAP for increased WOB  Abx / cultures per ID section. DuoNebs / Budesonide / Albuterol PRN. 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 P:  Heparin gtt in lieu of outpatient apixaban in case any procedures needed.  Consider return to [place] 5/4 Continue outpatient digoxin, diltiazem, toprol-xl (dose adjusted), rosuvastatin.  RENAL A:   CKD. Mild hypernatremia. P:   NS @ 50. BMP in AM.  GASTROINTESTINAL A:   GERD. Nutrition P:   Continue outpatient PPI. Advance diet as tolerated   HEMATOLOGIC A:   VTE Prophylaxis. Hx Multiple Prior DVT. P:  SCD's / Heparin. CBC in AM.  INFECTIOUS A:   Concern for HCAP. Hx E.coli bacteremia. P:   Abx as above (vanc / cefepime).  Follow cultures as above. PCT algorithm to limit abx exposure.  ENDOCRINE A:   Hx hyperthyroidism -  TSH 3.2 on admit P:   Continue synthroid   NEUROLOGIC A:   AMS - likely due to hypercapnia, CT head negative Hx anxiety Elevated ammonia > 53, Hx ETOH abuse  P:   Resume wellbutrin, mirtazapine  Hold outpatient percocet. Minimize sedating medications PT    Family updated:  No family at bedside.  Patient updated on plan of care.   Interdisciplinary Family Meeting v Palliative Care Meeting:  Due by: 09/25/15   Transfer to SDU and [facility] service as of 5/4 am.    Noe Gens, NP-C [place] [facility] Pgr: [phone] or if no answer [phone] 09/20/2015, 9:43 AM

## 2015-09-20 NOTE — ED Notes (Addendum)
Attempted report to 2s x2

## 2015-09-20 NOTE — ED Notes (Signed)
Pt yelling "to spit," large amounts of phlegm noted to L chest. Pt removed urinary condom catheter, attempted to replace condom catheter without success. Pt appears irritated with staff in the room.

## 2015-09-20 NOTE — Progress Notes (Signed)
ANTICOAGULATION CONSULT NOTE - Initial Consult  Pharmacy Consult for Heparin Indication: atrial fibrillation  Allergies  Allergen Reactions  . 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 Penicillins Rash    Patient Measurements: Weight: 180 lb (81.647 kg)  Vital Signs: Temp: 99.1 F (37.3 C) (05/02 2145) Temp Source: Rectal (05/02 2145) BP: 140/94 mmHg (05/02 2230) Pulse Rate: 103 (05/03 0007)  Labs:  Recent Labs  09/19/15 1518 09/19/15 1532  HGB 12.8* 15.3  HCT 41.9 45.0  PLT 182  --   CREATININE 1.43* 1.30*    Estimated Creatinine Clearance: 60.2 mL/min (by C-G formula based on Cr of 1.3).   Medical History: Past Medical History  Diagnosis Date  . Hypertension   . Chronic systolic heart failure (HCC)     a. NICM - EF 35% with normal cors 2003. b. Last echo 11/2012 - EF 25-30%.  . Depression   . GERD (gastroesophageal reflux disease)   . Portal vein thrombosis   . Avascular necrosis of bones of both hips (HCC)     Status post bilateral hip replacements  . Gastritis   . Alcohol abuse   . Erectile dysfunction   . Bipolar disorder (HCC)   . Hydrocele, unspecified   . Lung nodule     Chest CT scan on 12/14/2008 showed a nodular opacity at the left lung base felt to most likely represent scarring.  Follow-up chest CT scan on 06/02/2010 showed parenchymal scarring in the left apex, left  lower lobe, and lingula; some of this scarring at the left base had a nodular appearance, unchanged. Chest 09/2012 - stable.  . 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  Thyroid scan 08/28/2009 was normal with no focal areas of abnormal increased or decreased activity seen; the uptake of I 131 sodium iodide at 24 hours was 5.7%.  TSH and free T4 normalized by 08/16/2009.  . Intestinal obstruction (HCC)   . COPD (chronic obstructive pulmonary disease) (HCC)   . E coli bacteremia   . DVT (deep venous thrombosis) (HCC)    He has hypercoagulability with multiple prior DVTs  . Pleural plaque     H/o asbestos exposure. Chest CT on 06/02/2010 showed stable extensive calcified pleural plaques involving the left hemithorax, consistent with asbestos related pleural disease.  . Weight loss     Normal colonoscopy by Dr. Brodie on 11/06/2010.  . Tachycardia-bradycardia syndrome (HCC)     a. 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 b. St. Jude gen change 2010.  . 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  . Noncompliance   . Thrombocytopenia (HCC)   . Osteoarthritis   . Spondylosis   . Anxiety   . CKD (chronic kidney disease)     Renal U/S 12/04/2009 showed no pathological findings. Labs 12/04/2009 include normal ESR, C3, C4; neg ANA; SPEP showed nonspecific increase in the alpha-2 region with no M-spike; UPEP showed no monoclonal free light chains; urine IFE showed polyclonal increase in feree Kappa and/or free Lambda light chains. Baseline Cr reported 1.7-2.5.  . STEMI (ST elevation myocardial infarction) (HCC) 10/11/2014  . Hyperlipemia   . HCAP (healthcare-associated pneumonia) 08/24/2015  . On home oxygen therapy     "3L periodically" (08/24/2015)    Medications:  Albuterol ASA  Pulmicort  Wellbutrin  Digoxin  Cardizem  Lasix  Neurontin  Remeron    Prilosec  Crestor  Spiriva  Vit D  Xopenex  Toprol XL  MVI   Eliquis 2.5 mg BID  Assessment: 69 y.o. male admitted with AMS/respiratory failure, h/o Afib and Eliquis on hold, for heparin  Goal of Therapy:  APTT 66-102 while Eliquis interfering with anti-Xa level Heparin level 0.3-0.7 units/ml Monitor platelets by anticoagulation protocol: Yes   Plan:  Start heparin 1200 units/hr APTT/HL in 8 hours  ,  [place] 09/20/2015,12:11 AM   

## 2015-09-20 NOTE — Progress Notes (Signed)
[facility] Progress Note Patient Name: [name] DOB: 07/27/1945 MRN: [id number]   Date of Service  09/20/2015  HPI/Events of Note  Patient c/o generalized "total body" pain. Creatinine = 1.24.  eICU Interventions  Will order: 1. Motrin 400 mg PO Q 6 hours PRN.      Intervention Category Intermediate Interventions: Pain - evaluation and management  Sommer,Steven Eugene 09/20/2015, 9:10 PM

## 2015-09-20 NOTE — Progress Notes (Signed)
ANTICOAGULATION CONSULT NOTE - Initial Consult  Pharmacy Consult for Heparin Indication: atrial fibrillation  Allergies  Allergen Reactions  . 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 Penicillins Rash    Patient Measurements: Weight: 180 lb (81.647 kg)  Vital Signs: Temp: 98.2 F (36.8 C) (05/03 1100) Temp Source: Oral (05/03 1100) BP: 118/104 mmHg (05/03 1300) Pulse Rate: 56 (05/03 1300)  Labs:  Recent Labs  09/19/15 1518 09/19/15 1532 09/19/15 2356 09/20/15 0325 09/20/15 1110  HGB 12.8* 15.3  --  12.3*  --   HCT 41.9 45.0  --  39.6  --   PLT 182  --   --  165  --   APTT  --   --  32  --  93*  HEPARINUNFRC  --   --  >2.20*  --  2.20*  CREATININE 1.43* 1.30*  --  1.24  --     Estimated Creatinine Clearance: 63.1 mL/min (by C-G formula based on Cr of 1.24).   Medical History: Past Medical History  Diagnosis Date  . Hypertension   . Chronic systolic heart failure ([place])     a. NICM - EF 35% with normal cors 2003. b. Last echo 11/2012 - EF 25-30%.  . Depression   . GERD (gastroesophageal reflux disease)   . Portal vein thrombosis   . Avascular necrosis of bones of both hips (HCC)     Status post bilateral hip replacements  . Gastritis   . Alcohol abuse   . Erectile dysfunction   . Bipolar disorder ([place])   . Hydrocele, unspecified   . Lung nodule     Chest CT scan on 12/14/2008 showed a nodular opacity at the left lung base felt to most likely represent scarring.  Follow-up chest CT scan on 06/02/2010 showed parenchymal scarring in the left apex, left  lower lobe, and lingula; some of this scarring at the left base had a nodular appearance, unchanged. Chest 09/2012 - stable.  . 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  Thyroid scan 08/28/2009 was normal with no focal areas of abnormal increased or decreased activity seen; the uptake of I 131 sodium iodide at 24 hours was 5.7%.  TSH and free T4  normalized by 08/16/2009.  . Intestinal obstruction ([place])   . COPD (chronic obstructive pulmonary disease) ([place])   . E coli bacteremia   . DVT (deep venous thrombosis) ([place])     He has hypercoagulability with multiple prior DVTs  . Pleural plaque     H/o asbestos exposure. Chest CT on 06/02/2010 showed stable extensive calcified pleural plaques involving the left hemithorax, consistent with asbestos related pleural disease.  . Weight loss     Normal colonoscopy by Dr. Olevia Perches on 11/06/2010.  . Tachycardia-bradycardia syndrome [facility])     a. 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 b. St. Jude gen change 2010.  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  . Noncompliance   . Thrombocytopenia ([place])   . Osteoarthritis   . Spondylosis   . Anxiety   . CKD (chronic kidney disease)     Renal U/S 12/04/2009 showed no pathological findings. Labs 12/04/2009 include normal ESR, C3, C4; neg ANA; SPEP showed nonspecific increase in the alpha-2 region with no M-spike; UPEP showed no monoclonal free light chains; urine IFE showed polyclonal increase in feree Kappa and/or  free Lambda light chains. Baseline Cr reported 1.7-2.5.  . STEMI (ST elevation myocardial infarction) ([place]) 10/11/2014  . Hyperlipemia   . HCAP (healthcare-associated pneumonia) 08/24/2015  . On home oxygen therapy     "3L periodically" (08/24/2015)    Assessment: 70 y.o. male admitted with AMS/respiratory failure, h/o of Afib on Eliquis. Now switched to IV heparin for possible procedures. Heparin remains falsely elevated at 2.2, aPTT is therapeutic at 93. May not have any procedures so might switch back on 5/4. Hgb 12.3, plts wnl.  Goal of Therapy:  APTT 66-102 while Eliquis interfering with anti-Xa level Heparin level 0.3-0.7 units/ml Monitor platelets by anticoagulation protocol: Yes   Plan:  Continue heparin gtt at 1,200 units/hr Monitor  daily aPTT / HL, CBC, s/s of bleed Check 8 hr confirmatory aPTT  Elenor Quinones, PharmD, [facility] Clinical Pharmacist Pager [phone] 09/20/2015 1:48 PM

## 2015-09-21 ENCOUNTER — Inpatient Hospital Stay (HOSPITAL_COMMUNITY): Payer: Commercial Managed Care - HMO

## 2015-09-21 ENCOUNTER — Telehealth: Payer: Self-pay | Admitting: *Deleted

## 2015-09-21 ENCOUNTER — Encounter: Payer: Commercial Managed Care - HMO | Admitting: Thoracic Surgery (Cardiothoracic Vascular Surgery)

## 2015-09-21 DIAGNOSIS — G253 Myoclonus: Secondary | ICD-10-CM | POA: Insufficient documentation

## 2015-09-21 DIAGNOSIS — Z950 Presence of cardiac pacemaker: Secondary | ICD-10-CM

## 2015-09-21 DIAGNOSIS — N184 Chronic kidney disease, stage 4 (severe): Secondary | ICD-10-CM

## 2015-09-21 DIAGNOSIS — Z9981 Dependence on supplemental oxygen: Secondary | ICD-10-CM

## 2015-09-21 LAB — BASIC METABOLIC PANEL
ANION GAP: 9 (ref 5–15)
BUN: 16 mg/dL (ref 6–20)
CHLORIDE: 109 mmol/L (ref 101–111)
CO2: 27 mmol/L (ref 22–32)
Calcium: 8.7 mg/dL — ABNORMAL LOW (ref 8.9–10.3)
Creatinine, Ser: 1.42 mg/dL — ABNORMAL HIGH (ref 0.61–1.24)
GFR, EST AFRICAN AMERICAN: 56 mL/min — AB (ref >60)
GFR, EST NON AFRICAN AMERICAN: 48 mL/min — AB (ref >60)
Glucose, Bld: 67 mg/dL (ref 65–99)
POTASSIUM: 3.6 mmol/L (ref 3.5–5.1)
SODIUM: 145 mmol/L (ref 135–145)

## 2015-09-21 LAB — CBC
HEMATOCRIT: 40 % (ref 39.0–52.0)
HEMOGLOBIN: 12.4 g/dL — AB (ref 13.0–17.0)
MCH: 29.2 pg (ref 26.0–34.0)
MCHC: 31 g/dL (ref 30.0–36.0)
MCV: 94.1 fL (ref 78.0–100.0)
Platelets: 179 10*3/uL (ref 150–400)
RBC: 4.25 MIL/uL (ref 4.22–5.81)
RDW: 14.9 % (ref 11.5–15.5)
WBC: 6.7 10*3/uL (ref 4.0–10.5)

## 2015-09-21 LAB — URINE CULTURE: SPECIAL REQUESTS: NORMAL

## 2015-09-21 LAB — GLUCOSE, CAPILLARY
GLUCOSE-CAPILLARY: 163 mg/dL — AB (ref 65–99)
GLUCOSE-CAPILLARY: 62 mg/dL — AB (ref 65–99)
GLUCOSE-CAPILLARY: 137 mg/dL — AB (ref 65–99)
GLUCOSE-CAPILLARY: 82 mg/dL (ref 65–99)
Glucose-Capillary: 124 mg/dL — ABNORMAL HIGH (ref 65–99)
Glucose-Capillary: 80 mg/dL (ref 65–99)
Glucose-Capillary: 94 mg/dL (ref 65–99)

## 2015-09-21 LAB — APTT: APTT: 118 s — AB (ref 24–37)

## 2015-09-21 LAB — HEPARIN LEVEL (UNFRACTIONATED): HEPARIN UNFRACTIONATED: 1.6 [IU]/mL — AB (ref 0.30–0.70)

## 2015-09-21 LAB — PROCALCITONIN

## 2015-09-21 MED ORDER — POLYVINYL ALCOHOL 1.4 % OP SOLN
1.0000 [drp] | OPHTHALMIC | Status: DC | PRN
  Administered 2015-09-21 – 2015-09-22 (×2): 1 [drp] via OPHTHALMIC
  Filled 2015-09-21: qty 15

## 2015-09-21 MED ORDER — APIXABAN 2.5 MG PO TABS
2.5000 mg | ORAL_TABLET | Freq: Two times a day (BID) | ORAL | Status: DC
  Administered 2015-09-21 – 2015-09-22 (×3): 2.5 mg via ORAL
  Filled 2015-09-21 (×3): qty 1

## 2015-09-21 NOTE — Progress Notes (Signed)
This encounter was created in error - please disregard.

## 2015-09-21 NOTE — Progress Notes (Signed)
Short Note:  Went to see patient at bedside. Appears to be back to his baseline. No longer has any jerking movements. The event was most likely rigors or some toxic metabolic phenomenon related to his sepsis. [place] reviewed. Do not feel strongly about EEG now that it has resolved after starting antibiotic therapy.  Please call back with any questions.

## 2015-09-21 NOTE — Evaluation (Signed)
Physical Therapy Evaluation Patient Details Name: [name] MRN: [id number] DOB: [birth year] Today's Date: 09/21/2015   History of Present Illness  70 y.o. male admitted to [facility] on 09/19/15 from SNF (he was doing rehab per pt) for acute on chronic hypercapneic repiratory failure, and acute encephalopathy (diff dx of hypercarbia vs PNA).  Pt with significant PMHx of COPD, DVT, tachy brady syndrome s/p pacemaker, CKD, STEMI, bil THA, and bil leg embolectomies.  Clinical Impression  Pt is generally weak and deconditioned.  HR increased to 123 with just EOB and OOB to chair right next to bed with RW.  Pt is adamant that he is not going to rehab after hospital stay, so if he continues to refuse, please maximize his home services including PT/OT/Aide.   PT to follow acutely for deficits listed below.       Follow Up Recommendations SNF;Supervision/Assistance - 24 hour    Equipment Recommendations  None recommended by PT    Recommendations for Other Services   NA    Precautions / Restrictions Precautions Precautions: Fall;Other (comment) Precaution Comments: monitor HR      Mobility  Bed Mobility Overal bed mobility: Modified Independent                Transfers Overall transfer level: Needs assistance Equipment used: Rolling walker (2 wheeled) Transfers: Sit to/from Stand Sit to Stand: Min guard         General transfer comment: Min guard assist to steady pt for balance.  He was able to get up from low bed with very light assist.   Ambulation/Gait Ambulation/Gait assistance: Min guard Ambulation Distance (Feet): 5 Feet Assistive device: Rolling walker (2 wheeled) Gait Pattern/deviations: Step-through pattern;Shuffle;Leaning posteriorly (posterior LOB when backing up)     General Gait Details: Pt weak and shaky on his feet.  Some posterior LOB when backing up to the recliner chair, min guard assist for safety and balance.  HR increased to 123 during just OOB to  chair.         Balance Overall balance assessment: Needs assistance Sitting-balance support: Feet supported;No upper extremity supported Sitting balance-Leahy Scale: Good     Standing balance support: Bilateral upper extremity supported Standing balance-Leahy Scale: Fair                               Pertinent Vitals/Pain Pain Assessment: No/denies pain Pain Score: 0-No pain    Home Living Family/patient expects to be discharged to:: Private residence Living Arrangements: Spouse/significant other;Other relatives ("God son") Available Help at Discharge: Family;Available PRN/intermittently Type of Home: House Home Access: Stairs to enter Entrance Stairs-Rails: None Entrance Stairs-Number of Steps: 1 Home Layout: One level Home Equipment: Walker - 2 wheels;Cane - single point;Bedside commode Additional Comments: Lives w/ son who is a Pharmacist, community and is rarely home.  Per RN: son reports that pt mostly remains in bed and only gets up when he needs to have a BM, otherwise uses urinal. (per previous admission hx)    Prior Function Level of Independence: Needs assistance   Gait / Transfers Assistance Needed: per pt he has only walked twice in his 20 day stay at rehab and that he can walk, "about 10 feet".            Hand Dominance   Dominant Hand: Right    Extremity/Trunk Assessment   Upper Extremity Assessment: Overall WFL for tasks assessed  Lower Extremity Assessment: Generalized weakness (4/5)      Cervical / Trunk Assessment: Normal  Communication   Communication: No difficulties  Cognition Arousal/Alertness: Awake/alert Behavior During Therapy: WFL for tasks assessed/performed Overall Cognitive Status: No family/caregiver present to determine baseline cognitive functioning                               Assessment/Plan    PT Assessment Patient needs continued PT services  PT Diagnosis Difficulty walking;Abnormality of  gait;Generalized weakness   PT Problem List Decreased strength;Decreased activity tolerance;Decreased balance;Decreased mobility;Decreased knowledge of use of DME;Cardiopulmonary status limiting activity  PT Treatment Interventions DME instruction;Gait training;Functional mobility training;Therapeutic activities;Balance training;Therapeutic exercise;Cognitive remediation;Neuromuscular re-education;Patient/family education   PT Goals (Current goals can be found in the Care Plan section) Acute Rehab PT Goals Patient Stated Goal: to go home and not go back to rehab at SNF PT Goal Formulation: With patient Time For Goal Achievement: 10/05/15 Potential to Achieve Goals: Good    Frequency Min 3X/week   Barriers to discharge Decreased caregiver support no caregiver present to confirm that he has assist at discharge that he would need to go safely home.       End of Session Equipment Utilized During Treatment: Oxygen Activity Tolerance: Patient limited by fatigue;Other (comment) (self limiting, and increased HR) Patient left: in chair;with call bell/phone within reach;with chair alarm set Nurse Communication: Mobility status         Time: [id number] PT Time Calculation (min) (ACUTE ONLY): 17 min   Charges:   PT Evaluation $PT Eval Moderate Complexity: 1 Procedure          Deshane Cotroneo B. [place], [place], DPT [phone]   09/21/2015, 5:47 PM

## 2015-09-21 NOTE — Progress Notes (Signed)
IMTS Transfer Note Hospital Course Mr. 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 He was discharged from SNF on 4/30 and was home for 1 day when his wife found him "jerking" at home. He was given multiple rounds of benzodiazepines and haloperidol both en route and in the ED for his convulsions and altered mental status/agitation, and developed progressively worsening shortness of breath requiring BiPAP and ICU admission. CXR showed possible PNA, so he was started on IV vancomycin and cefepime. He did not require intubation. His mentation and respiratory status improved quickly and was then transferred to IMTS on 09/21/2015.  Subjective: Mr. Schwaderer had no acute events overnight. He was transferred from the ICU. He had a brief episode of tremulousness in both his hands yesterday without other symptoms. He is alert and oriented this morning, feels at his baseline, and has no fevers, chills, any cough different from his baseline, shortness of breath, or other symptoms at this time.  Objective: Vital signs in last 24 hours: Filed Vitals:   09/21/15 0400 09/21/15 0750 09/21/15 0930 09/21/15 0933  BP: 113/63 128/90    Pulse: 91 93    Temp: 98 F (36.7 C) 98.7 F (37.1 C)    TempSrc: Oral Oral    Resp: 16 20    Height:      Weight: 170 lb 11.2 oz (77.429 kg)     SpO2: 100% 100% 100% 100%    Gen: Well-appearing, alert and oriented to person, place, and time HEENT: Oropharynx clear without erythema or exudate.  Neck: No cervical LAD, no thyromegaly or nodules, no JVD noted. CV: Normal rate, irregularly irregular rhythm, no murmurs, rubs, or gallops. Pacemaker present. Pulmonary: Normal effort, fine crackles at the right lung base, otherwise CTAB. No  wheezes auscultated. No dullness to percussion noted. Abdominal: Soft, non-tender, non-distended, without rebound, guarding, or masses Extremities: Distal pulses 2+ in upper and lower extremities bilaterally, no tenderness, erythema or edema Neuro: CN II-XII grossly intact, no focal weakness or sensory deficits noted Skin: No atypical appearing moles. No rashes  Lab Results: Basic Metabolic Panel:  Recent Labs Lab 09/20/15 0325 09/21/15 0749  NA 146* 145  K 4.0 3.6  CL 111 109  CO2 28 27  GLUCOSE 92 67  BUN 19 16  CREATININE 1.24 1.42*  CALCIUM 8.5* 8.7*  MG 1.4*  --   PHOS 3.5  --    CBC:  Recent Labs Lab 09/19/15 1518  09/20/15 0325 09/21/15 0749  WBC 10.8*  --  9.7 6.7  NEUTROABS 7.4  --   --   --   HGB 12.8*  < > 12.3* 12.4*  HCT 41.9  < > 39.6 40.0  MCV 94.4  --  94.5 94.1  PLT 182  --  165 179  < > = values in this interval not displayed.  Micro Results: Recent Results (from the past 240 hour(s))  Blood culture (routine x 2)     Status: None (Preliminary result)   Collection Time: 09/19/15  4:00 PM  Result Value Ref Range Status   Specimen Description BLOOD LEFT HAND  Final   Special Requests BOTTLES DRAWN AEROBIC AND ANAEROBIC 5CC  Final   Culture NO GROWTH < 24 HOURS  Final  Report Status PENDING  Incomplete  Blood culture (routine x 2)     Status: None (Preliminary result)   Collection Time: 09/19/15  4:19 PM  Result Value Ref Range Status   Specimen Description BLOOD LEFT HAND  Final   Special Requests IN PEDIATRIC BOTTLE 3CC  Final   Culture NO GROWTH < 24 HOURS  Final   Report Status PENDING  Incomplete  Urine culture     Status: None (Preliminary result)   Collection Time: 09/19/15  4:34 PM  Result Value Ref Range Status   Specimen Description URINE, CLEAN CATCH  Final   Special Requests Normal  Final   Culture TOO YOUNG TO READ  Final   Report Status PENDING  Incomplete  C difficile quick scan w PCR reflex     Status: None   Collection  Time: 09/20/15  1:15 AM  Result Value Ref Range Status   C Diff antigen NEGATIVE NEGATIVE Final   C Diff toxin NEGATIVE NEGATIVE Final   C Diff interpretation Negative for toxigenic C. difficile  Final   Studies/Results: Ct Head Wo Contrast  09/19/2015  CLINICAL DATA:  Altered mental status EXAM: CT HEAD WITHOUT CONTRAST TECHNIQUE: Contiguous axial images were obtained from the base of the skull through the vertex without intravenous contrast. COMPARISON:  Oct 11, 2014 FINDINGS: There is no midline shift, hydrocephalus, or mass. No acute hemorrhage or acute transcortical infarct is identified. There is chronic diffuse atrophy. Chronic bilateral periventricular white matter small vessel ischemic changes noted. The bony calvarium is intact. The visualized sinuses are clear. IMPRESSION: No focal acute intracranial abnormality identified. Chronic diffuse atrophy. Electronically Signed   By: Abelardo Diesel M.D.   On: 09/19/2015 18:32   Dg Chest Port 1 View  09/21/2015  CLINICAL DATA:  Acute respiratory failure EXAM: PORTABLE CHEST 1 VIEW COMPARISON:  Oval chest x-ray of 09/20/2015 at, 08/13/2015 and 06/15/2005 FINDINGS: Aeration of the left lung base has improved somewhat. Chronic changes throughout left hemithorax are stable. Patchy airspace disease is again noted in the left mid upper lung field and medially at the right lung base suspicious for foci of pneumonia. A permanent pacemaker remains and cardiomegaly is stable. IMPRESSION: 1. Somewhat improved aeration particular at the left lung base. 2. Patchy airspace disease in the left upper mid lung and right medial lung base suspicious for pneumonia. Electronically Signed   By: Ivar Drape M.D.   On: 09/21/2015 08:16   Dg Chest Port 1 View  09/20/2015  CLINICAL DATA:  Initial evaluation for acute respiratory failure. EXAM: PORTABLE CHEST 1 VIEW COMPARISON:  Prior radiograph from 09/19/2015. FINDINGS: Cardiomegaly unchanged. Mediastinal silhouette within  normal limits. Atheromatous plaque within the aortic arch. Right-sided pacemaker/ AICD again noted. Similar low lung volumes. Probable left pleural effusion. Persistent patchy and confluent left upper and lower lobe opacity, slightly progressed from prior. Improved aeration of the right lung base with improved right basilar opacity. Similar left fibrothorax. No pneumothorax identified. Mild vascular congestion without pulmonary edema. Osseous structures unchanged. IMPRESSION: 1. Slight interval worsening and patchy and confluent opacity involving the left upper and lower lobes as compared to prior radiograph from 09/19/2015. 2. Improved aeration of the right lung base with improved right basilar opacity. 3. Similar left fibrothorax. Persistent suspected small left pleural effusion. Electronically Signed   By: Jeannine Boga M.D.   On: 09/20/2015 05:39   Dg Chest Portable 1 View  09/19/2015  CLINICAL DATA:  70 year old male with altered mental status discovered at  1300 hours today. Initial encounter. EXAM: PORTABLE CHEST 1 VIEW COMPARISON:  08/24/2015 and earlier. FINDINGS: Portable AP semi upright view at 1458 hours. Unresolved Patchy and confluent left upper lobe and lung base opacity compared to April. Progressed and now all confluent right lung base opacity obscuring the diaphragm. Superimposed small left pleural effusion suspected. Left fibrothorax demonstrated on chest CT 10/13/2014. Stable cardiomegaly and mediastinal contours. Stable right chest cardiac pacemaker. No superimposed pneumothorax or pulmonary edema. Calcified aortic atherosclerosis. IMPRESSION: 1. Progressed bilateral patchy and confluent airspace opacity since April most suggestive of bilateral pneumonia. Consider aspiration in this setting. 2. 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 Electronically Signed   By: Genevie Ann M.D.   On: 09/19/2015 15:13   Assessment/Plan: 1. Acute on chronic hypercapneic  respiratory failure - most likely due to oversedation to treat his agitation/encephalopathy in the setting of baseline "COPD" and fibrothorax. He responded well to BiPAP and breathing treatments, now weaned off and comfortable on his home 2L. PNA was suspected due to initial CXR findings with worsening left upper and right base findings, however, patient denies any change in symptoms from his baseline chronic cough prior to presentation. WBC only 10.7 initially, now normal with negative PCT. Regardless, his clinical picture is improving currently. -Obtain 2V CXR now -Continue cefepime IV for PNA coverage; d/c vancomycin given low clinical suspicion for MRSA PNA and h/o CKD -Duonebs q6hrs -F/u BCx - NGTD -Avoid sedating meds at time  2. Acute encephalopathy - initially presented with agitation, convulsions that were thought to be seizure activity initially, treated with benzos. This did not improve his symptoms and made his respiratory status worse. With a questionable PNA this may reflect an acute encephalopathy due to infection, but is more likely related to his hypercarbea if anything. He did take 2 pills the morning of his presentation given to him by the SNF, but is unsure what they were, which may have also contributed. -Neurology following; no indication for EEG at this time. Greatly appreciate the thoughtful care of the mutual patient and will re-consult if needed -Will review SNF medications he was discharged with with the patient here -CTM clinically  Dispo: Disposition is deferred at this time, awaiting improvement of current medical problems.  Anticipated discharge in approximately 1-2 day(s).   The patient does have a current PCP (Zada Finders, MD) and does need an [facility] hospital follow-up appointment after discharge.  The patient does not have transportation limitations that hinder transportation to clinic appointments.   LOS: 2 days   Norval Gable, MD 09/21/2015, 11:06 AM

## 2015-09-21 NOTE — Progress Notes (Signed)
Pt offered Cpap pt states will not wear tonight dont need it,

## 2015-09-21 NOTE — Telephone Encounter (Signed)
Received call from patient while admitted requesting a diet change. He does not like his "heart healthy" diet. Informed patient that he needed to speak with his floor nurse about diet changes and that we could not do anything from the clinic regarding this. Educated patient that he needed to follow the diet ordered.

## 2015-09-21 NOTE — Progress Notes (Signed)
MD notified about patient's increase in heart rate. Will continue to monitor for now.

## 2015-09-21 NOTE — Progress Notes (Signed)
Pt. Refused bipap for tonight. 

## 2015-09-21 NOTE — Consult Note (Signed)
   [facility] Inpatient Consult   09/21/2015  [name] 1946/01/17 [id number]   Patient is currently active [prior to admission] with [facility] for chronic disease management services through his [facility]. Patient has been engaged by a [employer] and CSW.  Our community based plan of care has focused on disease management and community resource support. Met with the patient at bedside.  He states he had just left the skilled facility, seen the nurse from the home health agency [not sure of the name] that same day.  States the home health came back out and sent him to the hospital.  He states with Pneumonia.  He states that he had applied for Medicaid but was told the day of discharge from the facility he had the wrong date on the application which made the application invalid. Consent form is active in file for [facility] services.   Patient will receive a post discharge transition of care call and will be evaluated for monthly home visits for assessments and disease process education.  Made Inpatient Case Manager aware that [facility] following. Of note, [facility] Care Management services does not replace or interfere with any services that are needed or arranged by inpatient case management or social work.  For additional questions or referrals please contact:  Natividad Brood, RN BSN [facility] Liaison  [phone] business mobile phone Toll free office [phone]

## 2015-09-21 NOTE — Progress Notes (Signed)
[place] for Heparin Indication: atrial fibrillation  Allergies  Allergen Reactions  . Amiodarone Other (See Comments)    Thyroiditis in the setting of amiodarone use  . Penicillins Rash    Patient Measurements: Height: 6' 3" (190.5 cm) Weight: 170 lb 11.2 oz (77.429 kg) IBW/kg (Calculated) : 84.5  Vital Signs: Temp: 98.7 F (37.1 C) (05/04 0750) Temp Source: Oral (05/04 0750) BP: 128/90 mmHg (05/04 0750) Pulse Rate: 93 (05/04 0750)  Labs:  Recent Labs  09/19/15 1518 09/19/15 1532  09/19/15 2356 09/20/15 0325 09/20/15 1110 09/20/15 1851 09/21/15 0749  HGB 12.8* 15.3  --   --  12.3*  --   --  12.4*  HCT 41.9 45.0  --   --  39.6  --   --  40.0  PLT 182  --   --   --  165  --   --  179  APTT  --   --   < > 32  --  93* 86* 118*  HEPARINUNFRC  --   --   --  >2.20*  --  2.20*  --  1.60*  CREATININE 1.43* 1.30*  --   --  1.24  --   --  1.42*  < > = values in this interval not displayed.  Estimated Creatinine Clearance: 52.2 mL/min (by C-G formula based on Cr of 1.42).   Medical History: Past Medical History  Diagnosis Date  . Hypertension   . Chronic systolic heart failure ([place])     a. NICM - EF 35% with normal cors 2003. b. Last echo 11/2012 - EF 25-30%.  . Depression   . GERD (gastroesophageal reflux disease)   . Portal vein thrombosis   . Avascular necrosis of bones of both hips ([facility])     Status post bilateral hip replacements  . Gastritis   . Alcohol abuse   . Erectile dysfunction   . Bipolar disorder ([place])   . Hydrocele, unspecified   . Lung nodule     Chest CT scan on 12/14/2008 showed a nodular opacity at the left lung base felt to most likely represent scarring.  Follow-up chest CT scan on 06/02/2010 showed parenchymal scarring in the left apex, left  lower lobe, and lingula; some of this scarring at the left base had a nodular appearance, unchanged. Chest 09/2012 - stable.  . Hyperthyroidism     Likely due to  thyroiditis with possible amiodarone association.  Thyroid scan 08/28/2009 was normal with no focal areas of abnormal increased or decreased activity seen; the uptake of I 131 sodium iodide at 24 hours was 5.7%.  TSH and free T4 normalized by 08/16/2009.  . Intestinal obstruction ([place])   . COPD (chronic obstructive pulmonary disease) ([place])   . E coli bacteremia   . DVT (deep venous thrombosis) ([place])     He has hypercoagulability with multiple prior DVTs  . Pleural plaque     H/o asbestos exposure. Chest CT on 06/02/2010 showed stable extensive calcified pleural plaques involving the left hemithorax, consistent with asbestos related pleural disease.  . Weight loss     Normal colonoscopy by Dr. Olevia Perches on 11/06/2010.  . 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 b. St. Jude gen change 2010.  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  . Noncompliance   . Thrombocytopenia ([place])   .  Osteoarthritis   . Spondylosis   . Anxiety   . CKD (chronic kidney disease)     Renal U/S 12/04/2009 showed no pathological findings. Labs 12/04/2009 include normal ESR, C3, C4; neg ANA; SPEP showed nonspecific increase in the alpha-2 region with no M-spike; UPEP showed no monoclonal free light chains; urine IFE showed polyclonal increase in feree Kappa and/or free Lambda light chains. Baseline Cr reported 1.7-2.5.  . STEMI (ST elevation myocardial infarction) ([place]) 10/11/2014  . Hyperlipemia   . HCAP (healthcare-associated pneumonia) 08/24/2015  . On home oxygen therapy     "3L periodically" (08/24/2015)    Assessment: 70 y.o. male admitted with AMS/respiratory failure, h/o of Afib on Eliquis. Now switched to IV heparin for possible procedures. Heparin remains falsely elevated at 1.6, aPTT is now supratherapeutic at 118. May not have any procedures so might switch back on 5/4. Hgb  12.3, plts wnl.  Goal of Therapy:  APTT 66-102 while Eliquis interfering with anti-Xa level Heparin level 0.3-0.7 units/ml Monitor platelets by anticoagulation protocol: Yes   Plan:  Decrease heparin gtt to 1,100 units/hr Monitor daily aPTT / HL, CBC, s/s of bleed F/U resuming Eliquis? May restart on 5/4?  Elenor Quinones, PharmD, BCPS Clinical Pharmacist Pager [phone] 09/21/2015 9:35 AM

## 2015-09-22 ENCOUNTER — Telehealth: Payer: Self-pay | Admitting: Internal Medicine

## 2015-09-22 ENCOUNTER — Telehealth: Payer: Self-pay

## 2015-09-22 DIAGNOSIS — J9622 Acute and chronic respiratory failure with hypercapnia: Secondary | ICD-10-CM | POA: Diagnosis not present

## 2015-09-22 DIAGNOSIS — R630 Anorexia: Secondary | ICD-10-CM | POA: Diagnosis not present

## 2015-09-22 DIAGNOSIS — J189 Pneumonia, unspecified organism: Secondary | ICD-10-CM | POA: Diagnosis not present

## 2015-09-22 DIAGNOSIS — Z7984 Long term (current) use of oral hypoglycemic drugs: Secondary | ICD-10-CM | POA: Diagnosis not present

## 2015-09-22 DIAGNOSIS — R0600 Dyspnea, unspecified: Secondary | ICD-10-CM | POA: Diagnosis present

## 2015-09-22 DIAGNOSIS — J440 Chronic obstructive pulmonary disease with acute lower respiratory infection: Secondary | ICD-10-CM | POA: Diagnosis not present

## 2015-09-22 DIAGNOSIS — Z950 Presence of cardiac pacemaker: Secondary | ICD-10-CM | POA: Diagnosis not present

## 2015-09-22 DIAGNOSIS — E0590 Thyrotoxicosis, unspecified without thyrotoxic crisis or storm: Secondary | ICD-10-CM | POA: Diagnosis present

## 2015-09-22 DIAGNOSIS — R0789 Other chest pain: Secondary | ICD-10-CM | POA: Diagnosis not present

## 2015-09-22 DIAGNOSIS — K219 Gastro-esophageal reflux disease without esophagitis: Secondary | ICD-10-CM | POA: Diagnosis present

## 2015-09-22 DIAGNOSIS — R079 Chest pain, unspecified: Secondary | ICD-10-CM | POA: Diagnosis not present

## 2015-09-22 DIAGNOSIS — F1010 Alcohol abuse, uncomplicated: Secondary | ICD-10-CM | POA: Diagnosis present

## 2015-09-22 DIAGNOSIS — Z9114 Patient's other noncompliance with medication regimen: Secondary | ICD-10-CM | POA: Diagnosis not present

## 2015-09-22 DIAGNOSIS — J9690 Respiratory failure, unspecified, unspecified whether with hypoxia or hypercapnia: Secondary | ICD-10-CM | POA: Diagnosis not present

## 2015-09-22 DIAGNOSIS — Z79899 Other long term (current) drug therapy: Secondary | ICD-10-CM | POA: Diagnosis not present

## 2015-09-22 DIAGNOSIS — Z96643 Presence of artificial hip joint, bilateral: Secondary | ICD-10-CM | POA: Diagnosis present

## 2015-09-22 DIAGNOSIS — I420 Dilated cardiomyopathy: Secondary | ICD-10-CM | POA: Diagnosis not present

## 2015-09-22 DIAGNOSIS — I739 Peripheral vascular disease, unspecified: Secondary | ICD-10-CM | POA: Diagnosis present

## 2015-09-22 DIAGNOSIS — Z87891 Personal history of nicotine dependence: Secondary | ICD-10-CM | POA: Diagnosis not present

## 2015-09-22 DIAGNOSIS — I252 Old myocardial infarction: Secondary | ICD-10-CM | POA: Diagnosis not present

## 2015-09-22 DIAGNOSIS — Z7901 Long term (current) use of anticoagulants: Secondary | ICD-10-CM | POA: Diagnosis not present

## 2015-09-22 DIAGNOSIS — R5381 Other malaise: Secondary | ICD-10-CM | POA: Diagnosis present

## 2015-09-22 DIAGNOSIS — N5089 Other specified disorders of the male genital organs: Secondary | ICD-10-CM | POA: Diagnosis not present

## 2015-09-22 DIAGNOSIS — I130 Hypertensive heart and chronic kidney disease with heart failure and stage 1 through stage 4 chronic kidney disease, or unspecified chronic kidney disease: Secondary | ICD-10-CM | POA: Diagnosis not present

## 2015-09-22 DIAGNOSIS — I712 Thoracic aortic aneurysm, without rupture: Secondary | ICD-10-CM | POA: Diagnosis present

## 2015-09-22 DIAGNOSIS — N529 Male erectile dysfunction, unspecified: Secondary | ICD-10-CM | POA: Diagnosis present

## 2015-09-22 DIAGNOSIS — R488 Other symbolic dysfunctions: Secondary | ICD-10-CM | POA: Diagnosis not present

## 2015-09-22 DIAGNOSIS — F319 Bipolar disorder, unspecified: Secondary | ICD-10-CM | POA: Diagnosis not present

## 2015-09-22 DIAGNOSIS — N4341 Spermatocele of epididymis, single: Secondary | ICD-10-CM | POA: Diagnosis present

## 2015-09-22 DIAGNOSIS — F419 Anxiety disorder, unspecified: Secondary | ICD-10-CM | POA: Diagnosis present

## 2015-09-22 DIAGNOSIS — E785 Hyperlipidemia, unspecified: Secondary | ICD-10-CM | POA: Diagnosis present

## 2015-09-22 DIAGNOSIS — I272 Other secondary pulmonary hypertension: Secondary | ICD-10-CM | POA: Diagnosis not present

## 2015-09-22 DIAGNOSIS — N433 Hydrocele, unspecified: Secondary | ICD-10-CM | POA: Diagnosis present

## 2015-09-22 DIAGNOSIS — J90 Pleural effusion, not elsewhere classified: Secondary | ICD-10-CM | POA: Diagnosis not present

## 2015-09-22 DIAGNOSIS — N183 Chronic kidney disease, stage 3 (moderate): Secondary | ICD-10-CM | POA: Diagnosis present

## 2015-09-22 DIAGNOSIS — J441 Chronic obstructive pulmonary disease with (acute) exacerbation: Secondary | ICD-10-CM | POA: Diagnosis not present

## 2015-09-22 DIAGNOSIS — M6281 Muscle weakness (generalized): Secondary | ICD-10-CM | POA: Diagnosis not present

## 2015-09-22 DIAGNOSIS — R0602 Shortness of breath: Secondary | ICD-10-CM | POA: Diagnosis not present

## 2015-09-22 DIAGNOSIS — J9621 Acute and chronic respiratory failure with hypoxia: Secondary | ICD-10-CM | POA: Diagnosis not present

## 2015-09-22 DIAGNOSIS — Z9119 Patient's noncompliance with other medical treatment and regimen: Secondary | ICD-10-CM | POA: Diagnosis not present

## 2015-09-22 DIAGNOSIS — G4700 Insomnia, unspecified: Secondary | ICD-10-CM | POA: Diagnosis not present

## 2015-09-22 DIAGNOSIS — R6520 Severe sepsis without septic shock: Secondary | ICD-10-CM | POA: Diagnosis not present

## 2015-09-22 DIAGNOSIS — Z9981 Dependence on supplemental oxygen: Secondary | ICD-10-CM | POA: Diagnosis not present

## 2015-09-22 DIAGNOSIS — I481 Persistent atrial fibrillation: Secondary | ICD-10-CM | POA: Diagnosis not present

## 2015-09-22 DIAGNOSIS — N503 Cyst of epididymis: Secondary | ICD-10-CM | POA: Diagnosis present

## 2015-09-22 DIAGNOSIS — I5043 Acute on chronic combined systolic (congestive) and diastolic (congestive) heart failure: Secondary | ICD-10-CM | POA: Diagnosis not present

## 2015-09-22 DIAGNOSIS — J61 Pneumoconiosis due to asbestos and other mineral fibers: Secondary | ICD-10-CM | POA: Diagnosis not present

## 2015-09-22 DIAGNOSIS — G9340 Encephalopathy, unspecified: Secondary | ICD-10-CM | POA: Diagnosis not present

## 2015-09-22 DIAGNOSIS — J9811 Atelectasis: Secondary | ICD-10-CM | POA: Diagnosis not present

## 2015-09-22 DIAGNOSIS — Z86718 Personal history of other venous thrombosis and embolism: Secondary | ICD-10-CM | POA: Diagnosis not present

## 2015-09-22 LAB — CBC
HCT: 38.6 % — ABNORMAL LOW (ref 39.0–52.0)
Hemoglobin: 11.8 g/dL — ABNORMAL LOW (ref 13.0–17.0)
MCH: 28.8 pg (ref 26.0–34.0)
MCHC: 30.6 g/dL (ref 30.0–36.0)
MCV: 94.1 fL (ref 78.0–100.0)
Platelets: 172 10*3/uL (ref 150–400)
RBC: 4.1 MIL/uL — ABNORMAL LOW (ref 4.22–5.81)
RDW: 14.9 % (ref 11.5–15.5)
WBC: 6.6 10*3/uL (ref 4.0–10.5)

## 2015-09-22 LAB — GLUCOSE, CAPILLARY
GLUCOSE-CAPILLARY: 88 mg/dL (ref 65–99)
GLUCOSE-CAPILLARY: 89 mg/dL (ref 65–99)
GLUCOSE-CAPILLARY: 73 mg/dL (ref 65–99)

## 2015-09-22 MED ORDER — CLINDAMYCIN HCL 300 MG PO CAPS
600.0000 mg | ORAL_CAPSULE | Freq: Two times a day (BID) | ORAL | Status: DC
  Filled 2015-09-22: qty 2

## 2015-09-22 MED ORDER — CLINDAMYCIN HCL 300 MG PO CAPS: 600.0000 mg | ORAL_CAPSULE | Freq: Two times a day (BID) | ORAL | Status: AC

## 2015-09-22 MED ORDER — CLINDAMYCIN HCL 150 MG PO CAPS: 450.0000 mg | ORAL_CAPSULE | Freq: Three times a day (TID) | ORAL | Status: DC

## 2015-09-22 MED ORDER — CLINDAMYCIN HCL 300 MG PO CAPS: 600.0000 mg | ORAL_CAPSULE | Freq: Two times a day (BID) | ORAL | Status: DC

## 2015-09-22 MED ORDER — IPRATROPIUM-ALBUTEROL 0.5-2.5 (3) MG/3ML IN SOLN
3.0000 mL | Freq: Three times a day (TID) | RESPIRATORY_TRACT | Status: DC
  Administered 2015-09-22: 3 mL via RESPIRATORY_TRACT
  Filled 2015-09-22: qty 3

## 2015-09-22 MED ORDER — CLINDAMYCIN HCL 300 MG PO CAPS
450.0000 mg | ORAL_CAPSULE | Freq: Three times a day (TID) | ORAL | Status: DC
  Filled 2015-09-22: qty 1

## 2015-09-22 NOTE — Care Management Important Message (Signed)
Important Message  Patient Details  Name: [name] MRN: [id number] Date of Birth: 1946-01-25   Medicare Important Message Given:  Yes    [name] May 09/22/2015, 3:06 PM

## 2015-09-22 NOTE — Progress Notes (Signed)
Pharmacy Antibiotic Note  [name] is a 70 y.o. male admitted on 09/19/2015 with sepsis.  Pharmacy has been consulted for vancomycin and cefepime dosing.  Day #4 of abx for sepsis / HAP. CCM states not sure if truly has PNA. May be able to take off abx soon if clinically no signs of infection. PCT is < 0.1. Afebrile, WBC wnl  Plan: Continue cefepime 1g IV Q8 Monitor clinical picture, renal function F/U C&S, abx deescalation / LOT Consider stopping abx soon?  Height: 6\' 3"  (190.5 cm) Weight: 171 lb (77.565 kg) IBW/kg (Calculated) : 84.5  Temp (24hrs), Avg:98.8 F (37.1 C), Min:98.1 F (36.7 C), Max:99.5 F (37.5 C)   Recent Labs Lab 09/19/15 1518 09/19/15 1532 09/19/15 1533 09/19/15 1841 09/19/15 1851 09/20/15 0325 09/21/15 0749 09/22/15 0440  WBC 10.8*  --   --   --   --  9.7 6.7 6.6  CREATININE 1.43* 1.30*  --   --   --  1.24 1.42*  --   LATICACIDVEN  --   --  3.31* 0.9 0.63  --   --   --     Estimated Creatinine Clearance: 52.4 mL/min (by C-G formula based on Cr of 1.42).    Allergies  Allergen Reactions  . Amiodarone Other (See Comments)    Thyroiditis in the setting of amiodarone use  . Penicillins Rash    Antimicrobials this admission: Vancomycin 5/2 >> 5/4 Cefepime 5/2 >>  Dose adjustments this admission: N/A  Microbiology results: 5/3 C. Diff > negative 5/2 Urine cx > multiple species present 5/2 Blood cx > ngtd MRSA PCR negative  Elenor Quinones, PharmD, [facility] Clinical Pharmacist Pager [phone] 09/22/2015 7:52 AM

## 2015-09-22 NOTE — Progress Notes (Signed)
RN attempted to call report twice to receiving facility with no answer. Transporters were updated on patient.

## 2015-09-22 NOTE — Clinical Social Work Note (Signed)
Insurance has been contacted. Will followup with insurance once patient chooses a bed.   Liz Beach MSW, [place], [place], [phone]

## 2015-09-22 NOTE — Clinical Social Work Placement (Signed)
   CLINICAL SOCIAL WORK PLACEMENT  NOTE  Date:  09/22/2015  Patient Details  Name: [name] MRN: [id number] Date of Birth: [birth year]  Clinical Social Work is seeking post-discharge placement for this patient at the [place] level of care (*CSW will initial, date and re-position this form in  chart as items are completed):  Yes   Patient/family provided with [facility] Work Department's list of facilities offering this level of care within the geographic area requested by the patient (or if unable, by the patient's family).  Yes   Patient/family informed of their freedom to choose among providers that offer the needed level of care, that participate in Medicare, Medicaid or managed care program needed by the patient, have an available bed and are willing to accept the patient.  Yes   Patient/family informed of [facility]'s ownership interest in [facility] and [facility], as well as of the fact that they are under no obligation to receive care at these facilities.  PASRR submitted to EDS on       PASRR number received on       Existing PASRR number confirmed on 09/22/15     FL2 transmitted to all facilities in geographic area requested by pt/family on 09/22/15     FL2 transmitted to all facilities within larger geographic area on       Patient informed that his/her managed care company has contracts with or will negotiate with certain facilities, including the following:        Yes   Patient/family informed of bed offers received.  Patient [facility] bed at [facility]     Physician recommends and patient chooses bed at      Patient to be transferred to [facility] on 09/22/15.  Patient to be transferred to facility by Ambulance     Patient family notified on 09/22/15 of transfer.  Name of family member notified:  Patient has notified family     PHYSICIAN Please prepare priority discharge summary, including  medications, Please prepare prescriptions, Please sign FL2     Additional Comment:  Per MD patient ready for DC to [facility]. [place] received: [id number]. RN, patient, patient's family, and facility notified of DC. RN given number for report. DC packet on chart. Ambulance transport requested for patient. CSW signing off.   _______________________________________________ Rigoberto Noel, LCSW 09/22/2015, 3:09 PM

## 2015-09-22 NOTE — Progress Notes (Signed)
Subjective: Mr. Ooley had no acute events overnight. He feels well this morning, and has no fevers, chills, any cough different from his baseline, shortness of breath, or other issues at this time. He wants to go home but does understand that he has not done well at home in the past (and only made it 1 day outside a SNF before coming back to the hospital at this past episode).  Objective: Vital signs in last 24 hours: Filed Vitals:   09/22/15 0000 09/22/15 0400 09/22/15 0834 09/22/15 0838  BP: 111/89 120/84 124/97 124/97  Pulse: 101 97 113 52  Temp: 98.1 F (36.7 C) 98.7 F (37.1 C) 98.2 F (36.8 C)   TempSrc: Oral Oral Axillary   Resp: 17 21 20 21   Height:      Weight:  171 lb (77.565 kg)    SpO2: 99% 96% 93% 91%    Gen: Well-appearing, alert and oriented to person, place, and time HEENT: Oropharynx clear without erythema or exudate.  Neck: No cervical LAD, no thyromegaly or nodules, no JVD noted. CV: Normal rate, irregularly irregular rhythm, no murmurs, rubs, or gallops. Pacemaker present. Pulmonary: Normal effort, fine crackles at the right lung base, otherwise CTAB. No wheezes auscultated. No dullness to percussion noted. Abdominal: Soft, non-tender, non-distended, without rebound, guarding, or masses Extremities: Distal pulses 2+ in upper and lower extremities bilaterally, no tenderness, erythema or edema Neuro: CN II-XII grossly intact, no focal weakness or sensory deficits noted Skin: No atypical appearing moles. No rashes  Lab Results: Basic Metabolic Panel:  Recent Labs Lab 09/20/15 0325 09/21/15 0749  NA 146* 145  K 4.0 3.6  CL 111 109  CO2 28 27  GLUCOSE 92 67  BUN 19 16  CREATININE 1.24 1.42*  CALCIUM 8.5* 8.7*  MG 1.4*  --   PHOS 3.5  --    CBC:  Recent Labs Lab 09/19/15 1518  09/21/15 0749 09/22/15 0440  WBC 10.8*  < > 6.7 6.6  NEUTROABS 7.4  --   --   --   HGB 12.8*  < > 12.4* 11.8*  HCT 41.9  < > 40.0 38.6*  MCV 94.4  < > 94.1 94.1  PLT  182  < > 179 172  < > = values in this interval not displayed.  Micro Results: Recent Results (from the past 240 hour(s))  Blood culture (routine x 2)     Status: None (Preliminary result)   Collection Time: 09/19/15  4:00 PM  Result Value Ref Range Status   Specimen Description BLOOD LEFT HAND  Final   Special Requests BOTTLES DRAWN AEROBIC AND ANAEROBIC 5CC  Final   Culture NO GROWTH 2 DAYS  Final   Report Status PENDING  Incomplete  Blood culture (routine x 2)     Status: None (Preliminary result)   Collection Time: 09/19/15  4:19 PM  Result Value Ref Range Status   Specimen Description BLOOD LEFT HAND  Final   Special Requests IN PEDIATRIC BOTTLE 3CC  Final   Culture NO GROWTH 2 DAYS  Final   Report Status PENDING  Incomplete  Urine culture     Status: Abnormal   Collection Time: 09/19/15  4:34 PM  Result Value Ref Range Status   Specimen Description URINE, CLEAN CATCH  Final   Special Requests Normal  Final   Culture MULTIPLE SPECIES PRESENT, SUGGEST RECOLLECTION (A)  Final   Report Status 09/21/2015 FINAL  Final  C difficile quick scan w PCR reflex  Status: None   Collection Time: 09/20/15  1:15 AM  Result Value Ref Range Status   C Diff antigen NEGATIVE NEGATIVE Final   C Diff toxin NEGATIVE NEGATIVE Final   C Diff interpretation Negative for toxigenic C. difficile  Final   Studies/Results: Dg Chest 2 View  09/21/2015  CLINICAL DATA:  Weakness, pneumonia EXAM: CHEST  2 VIEW COMPARISON:  09/21/2015 FINDINGS: Two lead cardiac pacer stable.  Mild cardiac enlargement unchanged. Increased opacity in the medial right lower lobe stable. Patchy opacity left mid lung and left lower lobe stable. A small pleural fluid component is suspected. IMPRESSION: Suspect small pleural effusions with bilateral multifocal patchy airspace disease again concerning for pneumonia. Electronically Signed   By: Skipper Cliche M.D.   On: 09/21/2015 14:55   Dg Chest Port 1 View  09/21/2015  CLINICAL  DATA:  Acute respiratory failure EXAM: PORTABLE CHEST 1 VIEW COMPARISON:  Oval chest x-ray of 09/20/2015 at, 08/13/2015 and 06/15/2005 FINDINGS: Aeration of the left lung base has improved somewhat. Chronic changes throughout left hemithorax are stable. Patchy airspace disease is again noted in the left mid upper lung field and medially at the right lung base suspicious for foci of pneumonia. A permanent pacemaker remains and cardiomegaly is stable. IMPRESSION: 1. Somewhat improved aeration particular at the left lung base. 2. Patchy airspace disease in the left upper mid lung and right medial lung base suspicious for pneumonia. Electronically Signed   By: Ivar Drape M.D.   On: 09/21/2015 08:16   Assessment/Plan: 1. Acute on chronic hypercapneic respiratory failure - most likely due to oversedation to treat his agitation/encephalopathy in the setting of baseline "COPD" and fibrothorax. He responded well to BiPAP and breathing treatments, now weaned off and comfortable on his home 2L. PNA is also suspected due to initial CXR findings with worsening left upper and right base findings, however, patient denies any change in symptoms from his baseline chronic cough prior to presentation. WBC only 10.7 initially, now normal with negative PCT. Regardless, his clinical picture is improving currently. -Continue cefepime IV for PNA coverage; also s/p vancomycin.  -Transition to PO clinda today for 10 days total -Duonebs q6hrs -F/u BCx - NGTD -Avoid sedating meds at time -PT recommending SNF; patient initially adamantly refused and wants to go home but does understand home may not be in his overall best interests. CSW discussed this further with him and he seems more agreeable today to that.  2. Acute encephalopathy - initially presented with agitation, convulsions that were thought to be seizure activity initially, treated with benzos. This did not improve his symptoms and made his respiratory status worse. With a  questionable PNA this may reflect an acute encephalopathy due to infection, but is more likely related to his hypercarbea if anything. He did take 2 pills the morning of his presentation given to him by the SNF, but is unsure what they were, which may have also contributed. Now resolved -Neurology following; no indication for EEG at this time. Greatly appreciate the thoughtful care of the mutual patient and will re-consult if needed -Will review SNF medications he was discharged with with the patient here -CTM clinically  Dispo: Disposition is deferred at this time, awaiting improvement of current medical problems.  Anticipated discharge to SNF this afternoon.  The patient does have a current PCP (Zada Finders, MD) and does need an [facility] hospital follow-up appointment after discharge.  The patient does not have transportation limitations that hinder transportation to clinic appointments.  LOS: 3 days   Norval Gable, MD 09/22/2015, 11:40 AM

## 2015-09-22 NOTE — Clinical Social Work Note (Signed)
Clinical Social Work Assessment  Patient Details  Name: [name] MRN: [id number] Date of Birth: Aug 15, 1945  Date of referral:  09/22/15               Reason for consult:  Facility Placement, Discharge Planning                Permission sought to share information with:  Facility Art therapist granted to share information::  Yes, Verbal Permission Granted  Name::        Agency::  SNFs  Relationship::     Contact Information:     Housing/Transportation Living arrangements for the past 2 months:  Single Family Home Source of Information:  Patient Patient Interpreter Needed:  None Criminal Activity/Legal Involvement Pertinent to Current Situation/Hospitalization:  No - Comment as needed Significant Relationships:  Adult Children Lives with:  Self Do you feel safe going back to the place where you live?  Yes Need for family participation in patient care:  Yes (Comment)  Care giving concerns:  The patient states that he may be agreeable to placement if CSW can find a bed with a facility he likes. Referrals made.   Social Worker assessment / plan:  CSW met with patient at bedside to complete assessment. The patient was discharged from a SNF and was at home one day before being readmitted to the hospital. The patient reports that he may be agreeable to placement at time of discharge if we can find a bed at a facility he is happy with. CSW explained SNF search/placement process and answered the patient's questions. CSW has made patient's insurance company aware of need for SNF. CSW will followup with bed offers.   Employment status:  Retired, Disabled (Comment on whether or not currently receiving Disability) Insurance information:  Programmer, applications PT Recommendations:  [place] / Referral to community resources:  [place]  Patient/Family's Response to care:  The patient appears content with the care he has  received.  Patient/Family's Understanding of and Emotional Response to Diagnosis, Current Treatment, and Prognosis:  The patient appears to have a fair understanding of his diagnosis, treatment plan, and post DC needs.   Emotional Assessment Appearance:  Appears stated age Attitude/Demeanor/Rapport:  Other (The patient is appropriate and welcoming of CSW.) Affect (typically observed):  Accepting, Appropriate, Calm Orientation:  Oriented to Self, Oriented to Place, Oriented to  Time, Oriented to Situation Alcohol / Substance use:  Not Applicable Psych involvement (Current and /or in the community):  No (Comment)  Discharge Needs  Concerns to be addressed:  Discharge Planning Concerns Readmission within the last 30 days:  Yes Current discharge risk:  Chronically ill, Physical Impairment Barriers to Discharge:  Continued Medical Work up   Fredderick Phenix B, LCSW 09/22/2015, 11:49 AM

## 2015-09-22 NOTE — NC FL2 (Signed)
[place] LEVEL OF CARE SCREENING TOOL     IDENTIFICATION  Patient Name: [name] Birthdate: [birth year] Sex: male Admission Date (Current Location): 09/19/2015  [facility] and [place] Number:  Herbalist and Address:  The [place]. [facility] [place] [facility], [place] [address], [place], [place] [address]      Provider Number: [id number]  Attending Physician Name and Address:  Annia Belt, MD  Relative Name and Phone Number:       Current Level of Care: Hospital Recommended Level of Care: [place] Prior Approval Number:    Date Approved/Denied:   PASRR Number: [id number]  Discharge Plan: SNF    Current Diagnoses: Patient Active Problem List   Diagnosis Date Noted  . COPD with hypoxia ([place])   . Chronic anticoagulation   . Myoclonic jerking   . Severe sepsis ([place]) 09/19/2015  . Altered mental state 09/19/2015  . Permanent atrial fibrillation ([place])   . CKD (chronic kidney disease)   . Acute respiratory failure with hypercapnia ([place])   . HCAP (healthcare-associated pneumonia) 08/24/2015  . Dyspnea 08/24/2015  . Coagulopathy ([place])   . 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 Chronic obstructive pulmonary disease ([place])   . Persistent atrial fibrillation ([place])   . 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 COPD exacerbation ([place])   . Acute respiratory failure ([place])   . Central line clotted   . Encounter for central line placement   . Malnutrition of moderate degree 08/13/2015  . Pressure ulcer 08/13/2015  . Ileus ([place])   . Hyperkalemia   . Intestinal obstruction ([place])   . Normal coronary arteries-2003 07/31/2015  . Demand ischemia ([place]) 07/31/2015  . Atrial fibrillation with RVR ([place]) 07/29/2015  . Acute systolic congestive heart failure ([place])   . Long term current use of opiate analgesic 06/27/2015  . Acute-on-chronic kidney injury ([place]) 06/25/2015  . Decreased appetite 06/19/2015  . Neuropathic pain of both  feet ([place]) 05/03/2015  . Heel pain 03/02/2015  . Chronic pain 12/13/2014  . Sepsis secondary to UTI ([place]) 11/12/2014  . Protein-calorie malnutrition, severe ([place]) 10/26/2014  . Longstanding persistent atrial fibrillation ([place]) 10/11/2014  . STEMI (ST elevation myocardial infarction) ([place]) 10/11/2014  . TIA (transient ischemic attack) 10/11/2014  . Unspecified constipation 01/25/2014  . Healthcare maintenance 01/25/2014  . BPH (benign prostatic hyperplasia) 01/20/2014  . PVD (peripheral vascular disease) ([place]) 12/10/2013  . Decreased hearing 09/08/2013  . Rash and nonspecific skin eruption 08/13/2013  . Non-ischemic cardiomyopathy ([place]) 05/17/2013  . Weight loss 03/19/2012  . Insomnia 11/19/2010  . Chronic combined systolic and diastolic congestive heart failure ([place]) 06/20/2010  . Long-term (current) use of anticoagulants 06/05/2010  . H/O Arterial embolism of leg  06/05/2010  . Depression 01/19/2010  . Chronic kidney disease (CKD), stage III (moderate) 11/18/2009  . History of hyperthyroidism 03/13/2009  . Bilateral hip pain 03/23/2007  . INTERMITTENT CLAUDICATION 10/24/2006  . OSTEOARTHRITIS 09/05/2006  . G E R D 09/04/2006  . HYDROCELE NOS 09/04/2006  . COPD (chronic obstructive pulmonary disease) ([place]) 08/28/2006  . BACK PAIN 06/02/2006  . ERECTILE DYSFUNCTION 03/01/2006  . History of tobacco abuse 03/01/2006  . Essential hypertension 03/01/2006  . Cardiac pacemaker in situ 03/01/2006  . SPONDYLOSIS 03/01/2006    Orientation RESPIRATION BLADDER Height & Weight     Self, Time, Situation, Place  O2 (3L) Continent Weight: 77.565 kg (171 lb) Height:  6\' 3"  (190.5 cm)  BEHAVIORAL SYMPTOMS/MOOD NEUROLOGICAL BOWEL NUTRITION  STATUS   (NONE)  (NONE) Incontinent Diet (Regular Diet)  AMBULATORY STATUS COMMUNICATION OF NEEDS Skin   Limited Assist Verbally Other (Comment) (Foot Callus)                       Personal Care Assistance Level of Assistance  Bathing, Feeding,  Dressing Bathing Assistance: Limited assistance Feeding assistance: Independent Dressing Assistance: Limited assistance     Functional Limitations Info  Sight, Hearing, Speech Sight Info: Adequate Hearing Info: Adequate Speech Info: Adequate    SPECIAL CARE FACTORS FREQUENCY  PT (By licensed PT), OT (By licensed OT)     PT Frequency: 5/day OT Frequency: 5/day            Contractures Contractures Info: Not present    Additional Factors Info  Allergies, Psychotropic, Code Status Code Status Info: Full Allergies Info: Amiodarone, Penicillins Psychotropic Info: Remeron, Maxipime         Current Medications (09/22/2015):  This is the current hospital active medication list Current Facility-Administered Medications  Medication Dose Route Frequency Provider Last Rate Last Dose  . 0.9 %  sodium chloride infusion  250 mL Intravenous PRN Rahul P Desai, PA-C      . antiseptic oral rinse (CPC / CETYLPYRIDINIUM CHLORIDE 0.05%) solution 7 mL  7 mL Mouth Rinse BID Donita Brooks, NP   7 mL at 09/22/15 0808  . apixaban (ELIQUIS) tablet 2.5 mg  2.5 mg Oral BID Norval Gable, MD   2.5 mg at 09/22/15 0802  . aspirin chewable tablet 81 mg  81 mg Oral Daily Rahul P Desai, PA-C   81 mg at 09/22/15 0801  . budesonide (PULMICORT) nebulizer solution 0.5 mg  0.5 mg Nebulization BID Rahul P Desai, PA-C   0.5 mg at 09/22/15 [id number]  . buPROPion [facility]) tablet 75 mg  75 mg Oral BID Donita Brooks, NP   75 mg at 09/22/15 0801  . ceFEPIme (MAXIPIME) 1 g in dextrose 5 % 50 mL IVPB  1 g Intravenous Q8H Wynell Balloon, RPH 100 mL/hr at 09/22/15 0800 1 g at 09/22/15 0800  . digoxin (LANOXIN) tablet 0.0625 mg  0.0625 mg Oral Daily Rahul P Desai, PA-C   0.0625 mg at 09/22/15 0802  . diltiazem (CARDIZEM CD) 24 hr capsule 120 mg  120 mg Oral Daily Rahul P Desai, PA-C   120 mg at 09/22/15 0801  . ibuprofen (ADVIL,MOTRIN) tablet 400 mg  400 mg Oral Q6H PRN Anders Simmonds, MD   400 mg at 09/21/15 1045  .  insulin aspart (novoLOG) injection 0-9 Units  0-9 Units Subcutaneous Q4H Javier Glazier, MD   1 Units at 09/21/15 2148  . ipratropium-albuterol (DUONEB) 0.5-2.5 (3) MG/3ML nebulizer solution 3 mL  3 mL Nebulization Q6H Rahul P Desai, PA-C   3 mL at 09/22/15 0832  . levalbuterol (XOPENEX) nebulizer solution 0.63 mg  0.63 mg Nebulization Q6H PRN Donita Brooks, NP   0.63 mg at 09/22/15 0448  . metoprolol succinate (TOPROL-XL) 24 hr tablet 100 mg  100 mg Oral Daily Rahul P Desai, PA-C   100 mg at 09/22/15 0801  . mirtazapine (REMERON) tablet 15 mg  15 mg Oral QHS Donita Brooks, NP   15 mg at 09/21/15 2148  . pantoprazole (PROTONIX) EC tablet 40 mg  40 mg Oral Q1200 Rahul P Desai, PA-C   40 mg at 09/21/15 1223  . polyvinyl alcohol (LIQUIFILM TEARS) 1.4 % ophthalmic solution 1 drop  1 drop Both Eyes PRN Collier Salina, MD   1 drop at 09/22/15 1042  . rosuvastatin (CRESTOR) tablet 20 mg  20 mg Oral q1800 Rahul P Desai, PA-C   20 mg at 09/21/15 1658     Discharge Medications: Please see discharge summary for a list of discharge medications.  Relevant Imaging Results:  Relevant Lab Results:   Additional Information SSN: [id number]  Rigoberto Noel, LCSW

## 2015-09-22 NOTE — Telephone Encounter (Signed)
[facility]ed [facility], rec'd scripts for inpt, informed to disregard. Verified w/ dr Merrilyn Puma

## 2015-09-22 NOTE — NC FL2 (Signed)
[place] LEVEL OF CARE SCREENING TOOL     IDENTIFICATION  Patient Name: [name] Birthdate: Dec 17, 1945 Sex: male Admission Date (Current Location): 09/19/2015  [facility] and [place] Number:  Herbalist and Address:  The [place]. [employer], [place] [address] [place], [place] [address]      Provider Number: [id number]  Attending Physician Name and Address:  Annia Belt, MD  Relative Name and Phone Number:       Current Level of Care: Hospital Recommended Level of Care: [place] Prior Approval Number:    Date Approved/Denied:   PASRR Number: [id number]  Discharge Plan: SNF    Current Diagnoses: Patient Active Problem List   Diagnosis Date Noted  . COPD with hypoxia ([place])   . Chronic anticoagulation   . Myoclonic jerking   . Severe sepsis ([place]) 09/19/2015  . Altered mental state 09/19/2015  . Permanent atrial fibrillation ([place])   . CKD (chronic kidney disease)   . Acute respiratory failure with hypercapnia ([place])   . HCAP (healthcare-associated pneumonia) 08/24/2015  . Dyspnea 08/24/2015  . Coagulopathy ([place])   . Arterial embolus of lower extremity ([place])   . Chronic obstructive pulmonary disease ([place])   . Persistent atrial fibrillation ([place])   . 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 COPD exacerbation ([place])   . Acute respiratory failure ([place])   . Central line clotted   . Encounter for central line placement   . Malnutrition of moderate degree 08/13/2015  . Pressure ulcer 08/13/2015  . Ileus ([place])   . Hyperkalemia   . Intestinal obstruction ([place])   . Normal coronary arteries-2003 07/31/2015  . Demand ischemia ([place]) 07/31/2015  . Atrial fibrillation with RVR ([place]) 07/29/2015  . Acute systolic congestive heart failure ([place])   . Long term current use of opiate analgesic 06/27/2015  . Acute-on-chronic kidney injury ([place]) 06/25/2015  . Decreased appetite 06/19/2015  . Neuropathic pain of both  feet ([place]) 05/03/2015  . Heel pain 03/02/2015  . Chronic pain 12/13/2014  . Sepsis secondary to UTI ([place]) 11/12/2014  . Protein-calorie malnutrition, severe ([place]) 10/26/2014  . Longstanding persistent atrial fibrillation ([place]) 10/11/2014  . STEMI (ST elevation myocardial infarction) ([place]) 10/11/2014  . TIA (transient ischemic attack) 10/11/2014  . Unspecified constipation 01/25/2014  . Healthcare maintenance 01/25/2014  . BPH (benign prostatic hyperplasia) 01/20/2014  . PVD (peripheral vascular disease) ([place]) 12/10/2013  . Decreased hearing 09/08/2013  . Rash and nonspecific skin eruption 08/13/2013  . Non-ischemic cardiomyopathy ([place]) 05/17/2013  . Weight loss 03/19/2012  . Insomnia 11/19/2010  . Chronic combined systolic and diastolic congestive heart failure ([place]) 06/20/2010  . Long-term (current) use of anticoagulants 06/05/2010  . H/O Arterial embolism of leg  06/05/2010  . Depression 01/19/2010  . Chronic kidney disease (CKD), stage III (moderate) 11/18/2009  . History of hyperthyroidism 03/13/2009  . Bilateral hip pain 03/23/2007  . INTERMITTENT CLAUDICATION 10/24/2006  . OSTEOARTHRITIS 09/05/2006  . G E R D 09/04/2006  . HYDROCELE NOS 09/04/2006  . COPD (chronic obstructive pulmonary disease) ([place]) 08/28/2006  . BACK PAIN 06/02/2006  . ERECTILE DYSFUNCTION 03/01/2006  . History of tobacco abuse 03/01/2006  . Essential hypertension 03/01/2006  . Cardiac pacemaker in situ 03/01/2006  . SPONDYLOSIS 03/01/2006    Orientation RESPIRATION BLADDER Height & Weight     Self, Time, Situation, Place  O2 (3L) Continent Weight: 77.565 kg (171 lb) Height:  6\' 3"  (190.5 cm)  BEHAVIORAL SYMPTOMS/MOOD NEUROLOGICAL BOWEL NUTRITION  STATUS   (NONE)  (NONE) Incontinent Diet (Regular Diet)  AMBULATORY STATUS COMMUNICATION OF NEEDS Skin   Limited Assist Verbally Other (Comment) (Foot Callus)                       Personal Care Assistance Level of Assistance  Bathing, Feeding,  Dressing Bathing Assistance: Limited assistance Feeding assistance: Independent Dressing Assistance: Limited assistance     Functional Limitations Info  Sight, Hearing, Speech Sight Info: Adequate Hearing Info: Adequate Speech Info: Adequate    SPECIAL CARE FACTORS FREQUENCY  PT (By licensed PT), OT (By licensed OT)     PT Frequency: 5/day OT Frequency: 5/day            Contractures Contractures Info: Not present    Additional Factors Info  Allergies, Psychotropic, Code Status Code Status Info: Full Allergies Info: Amiodarone, Penicillins Psychotropic Info: Remeron, Maxipime         Current Medications (09/22/2015):  This is the current hospital active medication list Current Facility-Administered Medications  Medication Dose Route Frequency Provider Last Rate Last Dose  . 0.9 %  sodium chloride infusion  250 mL Intravenous PRN Rahul P Desai, PA-C      . antiseptic oral rinse (CPC / CETYLPYRIDINIUM CHLORIDE 0.05%) solution 7 mL  7 mL Mouth Rinse BID Donita Brooks, NP   7 mL at 09/22/15 0808  . apixaban (ELIQUIS) tablet 2.5 mg  2.5 mg Oral BID Norval Gable, MD   2.5 mg at 09/22/15 0802  . aspirin chewable tablet 81 mg  81 mg Oral Daily Rahul P Desai, PA-C   81 mg at 09/22/15 0801  . budesonide (PULMICORT) nebulizer solution 0.5 mg  0.5 mg Nebulization BID Rahul P Desai, PA-C   0.5 mg at 09/22/15 [id number]  . buPROPion [facility]) tablet 75 mg  75 mg Oral BID Donita Brooks, NP   75 mg at 09/22/15 0801  . ceFEPIme (MAXIPIME) 1 g in dextrose 5 % 50 mL IVPB  1 g Intravenous Q8H Wynell Balloon, RPH 100 mL/hr at 09/22/15 0800 1 g at 09/22/15 0800  . digoxin (LANOXIN) tablet 0.0625 mg  0.0625 mg Oral Daily Rahul P Desai, PA-C   0.0625 mg at 09/22/15 0802  . diltiazem (CARDIZEM CD) 24 hr capsule 120 mg  120 mg Oral Daily Rahul P Desai, PA-C   120 mg at 09/22/15 0801  . ibuprofen (ADVIL,MOTRIN) tablet 400 mg  400 mg Oral Q6H PRN Anders Simmonds, MD   400 mg at 09/21/15 1045  .  insulin aspart (novoLOG) injection 0-9 Units  0-9 Units Subcutaneous Q4H Javier Glazier, MD   1 Units at 09/21/15 2148  . ipratropium-albuterol (DUONEB) 0.5-2.5 (3) MG/3ML nebulizer solution 3 mL  3 mL Nebulization Q6H Rahul P Desai, PA-C   3 mL at 09/22/15 0832  . levalbuterol (XOPENEX) nebulizer solution 0.63 mg  0.63 mg Nebulization Q6H PRN Donita Brooks, NP   0.63 mg at 09/22/15 0448  . metoprolol succinate (TOPROL-XL) 24 hr tablet 100 mg  100 mg Oral Daily Rahul P Desai, PA-C   100 mg at 09/22/15 0801  . mirtazapine (REMERON) tablet 15 mg  15 mg Oral QHS Donita Brooks, NP   15 mg at 09/21/15 2148  . pantoprazole (PROTONIX) EC tablet 40 mg  40 mg Oral Q1200 Rahul P Desai, PA-C   40 mg at 09/21/15 1223  . polyvinyl alcohol (LIQUIFILM TEARS) 1.4 % ophthalmic solution 1 drop  1 drop Both Eyes PRN Collier Salina, MD   1 drop at 09/22/15 1042  . rosuvastatin (CRESTOR) tablet 20 mg  20 mg Oral q1800 Rahul P Desai, PA-C   20 mg at 09/21/15 1658     Discharge Medications: Please see discharge summary for a list of discharge medications.  Relevant Imaging Results:  Relevant Lab Results:   Additional Information SSN: [id number]  Rigoberto Noel, LCSW

## 2015-09-22 NOTE — Discharge Summary (Signed)
Name: [name] MRN: [id number] DOB: [birth year] 70 y.o. PCP: Zada Finders, MD  Date of Admission: 09/19/2015  2:23 PM Date of Discharge: 09/22/2015 Attending Physician: Annia Belt, MD  Discharge Diagnosis: 1. Acute on chronic hypercapneic respiratory failure 2. Acute encephalopathy  Active Problems:   Severe sepsis (HCC)   Altered mental state   COPD with hypoxia ([place])   Chronic anticoagulation   Myoclonic jerking  Discharge Medications:   Medication List    ASK your doctor about these medications        albuterol 108 (90 Base) MCG/ACT inhaler  Commonly known as:  VENTOLIN HFA  Inhale 2 puffs into the lungs every 6 (six) hours as needed for wheezing or shortness of breath.     apixaban 2.5 MG Tabs tablet  Commonly known as:  ELIQUIS  Take 1 tablet (2.5 mg total) by mouth 2 (two) times daily.     aspirin 81 MG chewable tablet  Chew 1 tablet (81 mg total) by mouth daily.     bisacodyl 10 MG suppository  Commonly known as:  DULCOLAX  Place 1 suppository (10 mg total) rectally daily as needed for severe constipation.     budesonide 0.25 MG/2ML nebulizer solution  Commonly known as:  PULMICORT  Take 2 mLs (0.25 mg total) by nebulization 2 (two) times daily.     buPROPion 75 MG tablet  Commonly known as:  WELLBUTRIN  Take 1 tablet (75 mg total) by mouth 2 (two) times daily.     cholecalciferol 1000 units tablet  Commonly known as:  VITAMIN D  Take 1,000 Units by mouth daily.     Digoxin 62.5 MCG Tabs  Take 0.0625 mg by mouth daily.     diltiazem 120 MG 24 hr capsule  Commonly known as:  CARDIZEM CD  Take 1 capsule (120 mg total) by mouth daily.     diltiazem 30 MG tablet  Commonly known as:  CARDIZEM  Take one tablet by mouth every 4-6 hours as needed for palpitations and or heart rate >100     furosemide 40 MG tablet  Commonly known as:  LASIX  Take 1 tablet (40 mg total) by mouth daily as needed for edema (for 5 days).     gabapentin 100 MG  capsule  Commonly known as:  NEURONTIN  Take 3 capsules (300 mg total) by mouth 3 (three) times daily.     levalbuterol 0.63 MG/3ML nebulizer solution  Commonly known as:  XOPENEX  Take 0.63 mg by nebulization 3 (three) times daily.     metoprolol succinate 100 MG 24 hr tablet  Commonly known as:  TOPROL-XL  Take 100 mg by mouth daily. Take with or immediately following a meal.     mirtazapine 15 MG tablet  Commonly known as:  REMERON  TAKE 1 TABLET BY MOUTH AT BEDTIME     multivitamin with minerals Tabs tablet  Take 1 tablet by mouth daily.     omeprazole 40 MG capsule  Commonly known as:  PRILOSEC  Take 1 capsule (40 mg total) by mouth daily.     rosuvastatin 20 MG tablet  Commonly known as:  CRESTOR  Take 1 tablet (20 mg total) by mouth daily at 6 PM.     senna-docusate 8.6-50 MG tablet  Commonly known as:  [employer] SENNA PLUS  Take 1 tablet by mouth daily as needed for moderate constipation.     tiotropium 18 MCG inhalation capsule  Commonly known as:  SPIRIVA  Place 1 capsule (18 mcg total) into inhaler and inhale daily.     Urea 37.5 % Crea  Apply 1 application topically daily as needed (for irritation).        Disposition and follow-up:   [name] was discharged from [facility] in Good condition.  At the hospital follow up visit please address:  1.  Improvement in respiratory symptoms, did he finish the 7-day course of clindamycin  2.  Labs / imaging needed at time of follow-up: None  3.  Pending labs/ test needing follow-up: None  Follow-up Appointments: Please arrange follow-up with PCP approximately 1 week after discharge from SNF   Discharge Instructions: Please arrange follow-up with PCP approximately 1 week after discharge from SNF   Consultations:  Neurology  Procedures Performed:  Dg Chest 2 View  09/21/2015  CLINICAL DATA:  Weakness, pneumonia EXAM: CHEST  2 VIEW COMPARISON:  09/21/2015 FINDINGS: Two lead cardiac pacer  stable.  Mild cardiac enlargement unchanged. Increased opacity in the medial right lower lobe stable. Patchy opacity left mid lung and left lower lobe stable. A small pleural fluid component is suspected. IMPRESSION: Suspect small pleural effusions with bilateral multifocal patchy airspace disease again concerning for pneumonia. Electronically Signed   By: Skipper Cliche M.D.   On: 09/21/2015 14:55   Dg Chest 2 View  08/24/2015  CLINICAL DATA:  Shortness of breath.  Pneumonia. EXAM: CHEST  2 VIEW COMPARISON:  08/13/2015 FINDINGS: Chronic cardiomegaly. Lower lung volumes than when intubated with medial right basilar opacity. Pleural based scarring on the left. There is chronic pleural calcification on the left with small left hemi thorax. Dual-chamber pacer leads from the right. Stable aortic and hilar contours. IMPRESSION: 1. Right basilar atelectasis or pneumonia. 2. Chronic cardiomegaly and left fibrothorax. Electronically Signed   By: Monte Fantasia M.D.   On: 08/24/2015 07:06   Ct Head Wo Contrast  09/19/2015  CLINICAL DATA:  Altered mental status EXAM: CT HEAD WITHOUT CONTRAST TECHNIQUE: Contiguous axial images were obtained from the base of the skull through the vertex without intravenous contrast. COMPARISON:  Oct 11, 2014 FINDINGS: There is no midline shift, hydrocephalus, or mass. No acute hemorrhage or acute transcortical infarct is identified. There is chronic diffuse atrophy. Chronic bilateral periventricular white matter small vessel ischemic changes noted. The bony calvarium is intact. The visualized sinuses are clear. IMPRESSION: No focal acute intracranial abnormality identified. Chronic diffuse atrophy. Electronically Signed   By: Abelardo Diesel M.D.   On: 09/19/2015 18:32   Dg Chest Port 1 View  09/21/2015  CLINICAL DATA:  Acute respiratory failure EXAM: PORTABLE CHEST 1 VIEW COMPARISON:  Oval chest x-ray of 09/20/2015 at, 08/13/2015 and 06/15/2005 FINDINGS: Aeration of the left lung base  has improved somewhat. Chronic changes throughout left hemithorax are stable. Patchy airspace disease is again noted in the left mid upper lung field and medially at the right lung base suspicious for foci of pneumonia. A permanent pacemaker remains and cardiomegaly is stable. IMPRESSION: 1. Somewhat improved aeration particular at the left lung base. 2. Patchy airspace disease in the left upper mid lung and right medial lung base suspicious for pneumonia. Electronically Signed   By: Ivar Drape M.D.   On: 09/21/2015 08:16   Dg Chest Port 1 View  09/20/2015  CLINICAL DATA:  Initial evaluation for acute respiratory failure. EXAM: PORTABLE CHEST 1 VIEW COMPARISON:  Prior radiograph from 09/19/2015. FINDINGS: Cardiomegaly unchanged. Mediastinal silhouette within normal limits. Atheromatous plaque  within the aortic arch. Right-sided pacemaker/ AICD again noted. Similar low lung volumes. Probable left pleural effusion. Persistent patchy and confluent left upper and lower lobe opacity, slightly progressed from prior. Improved aeration of the right lung base with improved right basilar opacity. Similar left fibrothorax. No pneumothorax identified. Mild vascular congestion without pulmonary edema. Osseous structures unchanged. IMPRESSION: 1. Slight interval worsening and patchy and confluent opacity involving the left upper and lower lobes as compared to prior radiograph from 09/19/2015. 2. Improved aeration of the right lung base with improved right basilar opacity. 3. Similar left fibrothorax. Persistent suspected small left pleural effusion. Electronically Signed   By: Jeannine Boga M.D.   On: 09/20/2015 05:39   Dg Chest Portable 1 View  09/19/2015  CLINICAL DATA:  70 year old male with altered mental status discovered at 1300 hours today. Initial encounter. EXAM: PORTABLE CHEST 1 VIEW COMPARISON:  08/24/2015 and earlier. FINDINGS: Portable AP semi upright view at 1458 hours. Unresolved Patchy and confluent  left upper lobe and lung base opacity compared to April. Progressed and now all confluent right lung base opacity obscuring the diaphragm. Superimposed small left pleural effusion suspected. Left fibrothorax demonstrated on chest CT 10/13/2014. Stable cardiomegaly and mediastinal contours. Stable right chest cardiac pacemaker. No superimposed pneumothorax or pulmonary edema. Calcified aortic atherosclerosis. IMPRESSION: 1. Progressed bilateral patchy and confluent airspace opacity since April most suggestive of bilateral pneumonia. Consider aspiration in this setting. 2. 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 Electronically Signed   By: Genevie Ann M.D.   On: 09/19/2015 15:13    2D Echo: None  Cardiac Cath: None  Admission HPI: Pt is encephelopathic; therefore, this HPI is obtained from chart review. TALAN GRUSS is a 70 y.o. male with PMH as outlined below. He had recent admission 08/12/15 through 08/18/15 for PNA and A.fib with RVR. He presented to [facility] ED 09/19/15 with AMS x 24 hours. Fiance reported that pt was just discharged from SNF 1 day ago after he was sent there following his admission in March.  He had been in his USOH up until roughly 24 hours prior when he was noted to be confused and had intermittent "jerking". EMS was later called and upon their arrival there was concern for seizures so pt received 5mg  versed. Jerking continued and pt was brought to ED for further evaluation.  In ED, he was evaluated by neurology who obtained head CT which was negative for acute process. EGG is pending. CXR revealed progressive bilateral patchy opacities concerning for PNA.  While in ED, he became agitated while attempting to obtain ABG. He received 2.5mg  Haldol and 1mg  ativan. Later, he was minimally responsive so was placed on BiPAP. ABG was obtained and revealed mild hypercarbia (7.30 / 59 / 338). Due to concern for deterioration, PCCM was called for  admission.  Hospital Course by problem list: Active Problems:   Severe sepsis (HCC)   Altered mental state   COPD with hypoxia (HCC)   Chronic anticoagulation   Myoclonic jerking   1. Acute on chronic hypercapneic respiratory failure - He was discharged from SNF on 4/30 and was home for 1 day when his wife found him "jerking" at home. He was given multiple rounds of benzodiazepines and haloperidol both en route and in the ED for his convulsions and altered mental status/agitation, and developed progressively worsening shortness of breath. Arterial blood gas showed hypercarbic respiratory acidosis with pH 7.27 and PCO2 68.5 on a venous sample. First blood gas done  on an arterial sample while he was already on BiPAP showed a pH of 7.30, PCO2 59, PO2 338. A poor quality portable chest x-ray shows acute on chronic changes in the left lung and possible new infiltrate on the right. He was started on vancomycin and cefipime. He required BiPAP and ICU admission.  He did not require intubation. His mentation and respiratory status improved quickly and was then transferred to IMTS on 09/21/2015. Vancomycin was discontinued and cefepime was switched to clindamycin 600 BID (PCN allergy) given the possibility of an aspiration pneumonia given his initial altered mentation. PT evaluated him and recommended returning to SNF, and while the patient was initially resistant to this, he was eventually agreeable and was discharged to SNF on 09/22/2015.  2. Acute encephalopathy - initially presented with agitation, convulsions that were thought to be seizure activity initially, treated with benzos. This did not improve his symptoms and made his respiratory status worse. With a questionable PNA this may reflect an acute encephalopathy due to infection, but is more likely related to his hypercarbea if anything. He did take 2 pills the morning of his presentation given to him by the SNF, but is unsure what they were, which may have  also contributed. Neurology did not feel an EEG was warranted and that the confusion and jerking was most likely due to his acute hypercarbea. He had no recurrence of symptoms after the first day of presentation.  Discharge Vitals:   BP 124/97 mmHg  Pulse 52  Temp(Src) 98.2 F (36.8 C) (Axillary)  Resp 21  Ht 6\' 3"  (1.905 m)  Wt 171 lb (77.565 kg)  BMI 21.37 kg/m2  SpO2 91%  Discharge Labs:  Results for orders placed or performed during the hospital encounter of 09/19/15 (from the past 24 hour(s))  Glucose, capillary     Status: None   Collection Time: 09/21/15 11:53 AM  Result Value Ref Range   Glucose-Capillary 82 65 - 99 mg/dL  Glucose, capillary     Status: None   Collection Time: 09/21/15  4:51 PM  Result Value Ref Range   Glucose-Capillary 94 65 - 99 mg/dL  Glucose, capillary     Status: Abnormal   Collection Time: 09/21/15  8:19 PM  Result Value Ref Range   Glucose-Capillary 124 (H) 65 - 99 mg/dL  Glucose, capillary     Status: None   Collection Time: 09/21/15 11:47 PM  Result Value Ref Range   Glucose-Capillary 80 65 - 99 mg/dL  Glucose, capillary     Status: None   Collection Time: 09/22/15  4:32 AM  Result Value Ref Range   Glucose-Capillary 88 65 - 99 mg/dL  CBC     Status: Abnormal   Collection Time: 09/22/15  4:40 AM  Result Value Ref Range   WBC 6.6 4.0 - 10.5 K/uL   RBC 4.10 (L) 4.22 - 5.81 MIL/uL   Hemoglobin 11.8 (L) 13.0 - 17.0 g/dL   HCT 38.6 (L) 39.0 - 52.0 %   MCV 94.1 78.0 - 100.0 fL   MCH 28.8 26.0 - 34.0 pg   MCHC 30.6 30.0 - 36.0 g/dL   RDW 14.9 11.5 - 15.5 %   Platelets 172 150 - 400 K/uL  Glucose, capillary     Status: None   Collection Time: 09/22/15  7:57 AM  Result Value Ref Range   Glucose-Capillary 89 65 - 99 mg/dL  Glucose, capillary     Status: None   Collection Time: 09/22/15 11:14 AM  Result  Value Ref Range   Glucose-Capillary 73 65 - 99 mg/dL   *Note: Due to a large number of results and/or encounters for the requested time  period, some results have not been displayed. A complete set of results can be found in Results Review.    Signed: Norval Gable, MD 09/22/2015, 11:52 AM    Services Ordered on Discharge: PT Equipment Ordered on Discharge: None

## 2015-09-22 NOTE — Telephone Encounter (Signed)
Spoke with patient and SNF nurse regarding his gabapentin that was continued in d/c orders to SNF but he reports being told not to take it. Recommended he not take this medication based on 08/27/15 evaluation and presentation with ? component of toxic encephalopathy at hospitalization here today.

## 2015-09-22 NOTE — Telephone Encounter (Signed)
Please call back [name] from [facility].

## 2015-09-24 LAB — CULTURE, BLOOD (ROUTINE X 2)
CULTURE: NO GROWTH
CULTURE: NO GROWTH

## 2015-09-27 ENCOUNTER — Telehealth: Payer: Self-pay

## 2015-09-27 ENCOUNTER — Emergency Department (HOSPITAL_COMMUNITY): Payer: Commercial Managed Care - HMO

## 2015-09-27 ENCOUNTER — Encounter (HOSPITAL_COMMUNITY): Payer: Self-pay | Admitting: Emergency Medicine

## 2015-09-27 ENCOUNTER — Encounter: Payer: Self-pay | Admitting: *Deleted

## 2015-09-27 ENCOUNTER — Inpatient Hospital Stay (HOSPITAL_COMMUNITY): Payer: Commercial Managed Care - HMO

## 2015-09-27 ENCOUNTER — Inpatient Hospital Stay (HOSPITAL_COMMUNITY)
Admission: EM | Admit: 2015-09-27 | Discharge: 2015-10-03 | DRG: 196 | Disposition: A | Discharge status: Home Health | Payer: Commercial Managed Care - HMO | Attending: Internal Medicine | Admitting: Internal Medicine

## 2015-09-27 DIAGNOSIS — I481 Persistent atrial fibrillation: Secondary | ICD-10-CM | POA: Diagnosis present

## 2015-09-27 DIAGNOSIS — N5089 Other specified disorders of the male genital organs: Secondary | ICD-10-CM

## 2015-09-27 DIAGNOSIS — K219 Gastro-esophageal reflux disease without esophagitis: Secondary | ICD-10-CM | POA: Diagnosis present

## 2015-09-27 DIAGNOSIS — E785 Hyperlipidemia, unspecified: Secondary | ICD-10-CM | POA: Diagnosis present

## 2015-09-27 DIAGNOSIS — Z950 Presence of cardiac pacemaker: Secondary | ICD-10-CM | POA: Diagnosis not present

## 2015-09-27 DIAGNOSIS — Z9981 Dependence on supplemental oxygen: Secondary | ICD-10-CM | POA: Diagnosis not present

## 2015-09-27 DIAGNOSIS — I272 Other secondary pulmonary hypertension: Secondary | ICD-10-CM | POA: Diagnosis present

## 2015-09-27 DIAGNOSIS — I130 Hypertensive heart and chronic kidney disease with heart failure and stage 1 through stage 4 chronic kidney disease, or unspecified chronic kidney disease: Secondary | ICD-10-CM | POA: Diagnosis present

## 2015-09-27 DIAGNOSIS — N503 Cyst of epididymis: Secondary | ICD-10-CM | POA: Diagnosis present

## 2015-09-27 DIAGNOSIS — Z9114 Patient's other noncompliance with medication regimen: Secondary | ICD-10-CM

## 2015-09-27 DIAGNOSIS — Z86718 Personal history of other venous thrombosis and embolism: Secondary | ICD-10-CM | POA: Diagnosis not present

## 2015-09-27 DIAGNOSIS — N432 Other hydrocele: Secondary | ICD-10-CM | POA: Diagnosis not present

## 2015-09-27 DIAGNOSIS — Z7984 Long term (current) use of oral hypoglycemic drugs: Secondary | ICD-10-CM | POA: Diagnosis not present

## 2015-09-27 DIAGNOSIS — Z96643 Presence of artificial hip joint, bilateral: Secondary | ICD-10-CM | POA: Diagnosis present

## 2015-09-27 DIAGNOSIS — I5022 Chronic systolic (congestive) heart failure: Secondary | ICD-10-CM | POA: Diagnosis not present

## 2015-09-27 DIAGNOSIS — F319 Bipolar disorder, unspecified: Secondary | ICD-10-CM | POA: Diagnosis present

## 2015-09-27 DIAGNOSIS — N4341 Spermatocele of epididymis, single: Secondary | ICD-10-CM | POA: Diagnosis present

## 2015-09-27 DIAGNOSIS — I252 Old myocardial infarction: Secondary | ICD-10-CM | POA: Diagnosis not present

## 2015-09-27 DIAGNOSIS — Z7901 Long term (current) use of anticoagulants: Secondary | ICD-10-CM | POA: Diagnosis not present

## 2015-09-27 DIAGNOSIS — I739 Peripheral vascular disease, unspecified: Secondary | ICD-10-CM | POA: Diagnosis present

## 2015-09-27 DIAGNOSIS — I509 Heart failure, unspecified: Secondary | ICD-10-CM | POA: Diagnosis not present

## 2015-09-27 DIAGNOSIS — J440 Chronic obstructive pulmonary disease with acute lower respiratory infection: Secondary | ICD-10-CM | POA: Diagnosis not present

## 2015-09-27 DIAGNOSIS — E0590 Thyrotoxicosis, unspecified without thyrotoxic crisis or storm: Secondary | ICD-10-CM | POA: Diagnosis present

## 2015-09-27 DIAGNOSIS — F1010 Alcohol abuse, uncomplicated: Secondary | ICD-10-CM | POA: Diagnosis present

## 2015-09-27 DIAGNOSIS — N433 Hydrocele, unspecified: Secondary | ICD-10-CM | POA: Diagnosis present

## 2015-09-27 DIAGNOSIS — N529 Male erectile dysfunction, unspecified: Secondary | ICD-10-CM | POA: Diagnosis present

## 2015-09-27 DIAGNOSIS — I5043 Acute on chronic combined systolic (congestive) and diastolic (congestive) heart failure: Secondary | ICD-10-CM | POA: Diagnosis not present

## 2015-09-27 DIAGNOSIS — J9690 Respiratory failure, unspecified, unspecified whether with hypoxia or hypercapnia: Secondary | ICD-10-CM | POA: Diagnosis not present

## 2015-09-27 DIAGNOSIS — R0602 Shortness of breath: Secondary | ICD-10-CM | POA: Diagnosis not present

## 2015-09-27 DIAGNOSIS — J9811 Atelectasis: Secondary | ICD-10-CM | POA: Diagnosis present

## 2015-09-27 DIAGNOSIS — N183 Chronic kidney disease, stage 3 (moderate): Secondary | ICD-10-CM | POA: Diagnosis present

## 2015-09-27 DIAGNOSIS — J9819 Other pulmonary collapse: Secondary | ICD-10-CM | POA: Diagnosis not present

## 2015-09-27 DIAGNOSIS — I712 Thoracic aortic aneurysm, without rupture: Secondary | ICD-10-CM | POA: Diagnosis present

## 2015-09-27 DIAGNOSIS — Z79899 Other long term (current) drug therapy: Secondary | ICD-10-CM

## 2015-09-27 DIAGNOSIS — J90 Pleural effusion, not elsewhere classified: Secondary | ICD-10-CM | POA: Insufficient documentation

## 2015-09-27 DIAGNOSIS — Z87891 Personal history of nicotine dependence: Secondary | ICD-10-CM | POA: Diagnosis not present

## 2015-09-27 DIAGNOSIS — Z9119 Patient's noncompliance with other medical treatment and regimen: Secondary | ICD-10-CM | POA: Diagnosis not present

## 2015-09-27 DIAGNOSIS — J948 Other specified pleural conditions: Secondary | ICD-10-CM | POA: Diagnosis not present

## 2015-09-27 DIAGNOSIS — R5381 Other malaise: Secondary | ICD-10-CM | POA: Diagnosis present

## 2015-09-27 DIAGNOSIS — R0600 Dyspnea, unspecified: Secondary | ICD-10-CM | POA: Diagnosis not present

## 2015-09-27 DIAGNOSIS — R079 Chest pain, unspecified: Secondary | ICD-10-CM | POA: Diagnosis not present

## 2015-09-27 DIAGNOSIS — F419 Anxiety disorder, unspecified: Secondary | ICD-10-CM | POA: Diagnosis present

## 2015-09-27 DIAGNOSIS — J61 Pneumoconiosis due to asbestos and other mineral fibers: Principal | ICD-10-CM | POA: Diagnosis present

## 2015-09-27 DIAGNOSIS — I420 Dilated cardiomyopathy: Secondary | ICD-10-CM | POA: Diagnosis not present

## 2015-09-27 DIAGNOSIS — J189 Pneumonia, unspecified organism: Secondary | ICD-10-CM | POA: Diagnosis present

## 2015-09-27 DIAGNOSIS — J449 Chronic obstructive pulmonary disease, unspecified: Secondary | ICD-10-CM | POA: Diagnosis not present

## 2015-09-27 DIAGNOSIS — I5023 Acute on chronic systolic (congestive) heart failure: Secondary | ICD-10-CM | POA: Diagnosis not present

## 2015-09-27 DIAGNOSIS — R0789 Other chest pain: Secondary | ICD-10-CM | POA: Diagnosis not present

## 2015-09-27 LAB — COMPREHENSIVE METABOLIC PANEL
ALBUMIN: 3 g/dL — AB (ref 3.5–5.0)
ALT: 36 U/L (ref 17–63)
ANION GAP: 7 (ref 5–15)
AST: 36 U/L (ref 15–41)
Alkaline Phosphatase: 79 U/L (ref 38–126)
BILIRUBIN TOTAL: 0.7 mg/dL (ref 0.3–1.2)
BUN: 19 mg/dL (ref 6–20)
CO2: 28 mmol/L (ref 22–32)
Calcium: 9 mg/dL (ref 8.9–10.3)
Chloride: 108 mmol/L (ref 101–111)
Creatinine, Ser: 1.44 mg/dL — ABNORMAL HIGH (ref 0.61–1.24)
GFR calc Af Amer: 55 mL/min — ABNORMAL LOW (ref >60)
GFR calc non Af Amer: 47 mL/min — ABNORMAL LOW (ref >60)
GLUCOSE: 133 mg/dL — AB (ref 65–99)
POTASSIUM: 4.8 mmol/L (ref 3.5–5.1)
SODIUM: 143 mmol/L (ref 135–145)
TOTAL PROTEIN: 6 g/dL — AB (ref 6.5–8.1)

## 2015-09-27 LAB — CBC
HEMATOCRIT: 40.5 % (ref 39.0–52.0)
Hemoglobin: 12.7 g/dL — ABNORMAL LOW (ref 13.0–17.0)
MCH: 29.1 pg (ref 26.0–34.0)
MCHC: 31.4 g/dL (ref 30.0–36.0)
MCV: 92.7 fL (ref 78.0–100.0)
PLATELETS: 193 10*3/uL (ref 150–400)
RBC: 4.37 MIL/uL (ref 4.22–5.81)
RDW: 15.4 % (ref 11.5–15.5)
WBC: 11.8 10*3/uL — ABNORMAL HIGH (ref 4.0–10.5)

## 2015-09-27 LAB — DIFFERENTIAL
BASOS ABS: 0 10*3/uL (ref 0.0–0.1)
BASOS PCT: 0 %
EOS ABS: 0.4 10*3/uL (ref 0.0–0.7)
Eosinophils Relative: 4 %
Lymphocytes Relative: 25 %
Lymphs Abs: 2.7 10*3/uL (ref 0.7–4.0)
Monocytes Absolute: 0.9 10*3/uL (ref 0.1–1.0)
Monocytes Relative: 8 %
NEUTROS ABS: 7 10*3/uL (ref 1.7–7.7)
NEUTROS PCT: 63 %

## 2015-09-27 LAB — I-STAT TROPONIN, ED: Troponin i, poc: 0.04 ng/mL (ref 0.00–0.08)

## 2015-09-27 LAB — BRAIN NATRIURETIC PEPTIDE: B Natriuretic Peptide: 1590.1 pg/mL — ABNORMAL HIGH (ref 0.0–100.0)

## 2015-09-27 LAB — PROCALCITONIN: Procalcitonin: <0.1 ng/mL

## 2015-09-27 LAB — TROPONIN I: Troponin I: 0.04 ng/mL — ABNORMAL HIGH (ref <0.031)

## 2015-09-27 LAB — I-STAT CG4 LACTIC ACID, ED: Lactic Acid, Venous: 1.14 mmol/L (ref 0.5–2.0)

## 2015-09-27 MED ORDER — ROSUVASTATIN CALCIUM 20 MG PO TABS
20.0000 mg | ORAL_TABLET | Freq: Every day | ORAL | Status: DC
  Administered 2015-09-27 – 2015-10-02 (×6): 20 mg via ORAL
  Filled 2015-09-27 (×7): qty 1

## 2015-09-27 MED ORDER — FUROSEMIDE 10 MG/ML IJ SOLN
40.0000 mg | Freq: Once | INTRAMUSCULAR | Status: AC
  Administered 2015-09-27: 40 mg via INTRAVENOUS
  Filled 2015-09-27: qty 4

## 2015-09-27 MED ORDER — BUDESONIDE 0.25 MG/2ML IN SUSP
0.2500 mg | Freq: Two times a day (BID) | RESPIRATORY_TRACT | Status: DC
  Administered 2015-09-27 – 2015-10-03 (×12): 0.25 mg via RESPIRATORY_TRACT
  Filled 2015-09-27 (×13): qty 2

## 2015-09-27 MED ORDER — DILTIAZEM HCL ER COATED BEADS 120 MG PO CP24
120.0000 mg | ORAL_CAPSULE | Freq: Every day | ORAL | Status: DC
  Administered 2015-09-28 – 2015-10-03 (×7): 120 mg via ORAL
  Filled 2015-09-27 (×7): qty 1

## 2015-09-27 MED ORDER — SODIUM CHLORIDE 0.9% FLUSH
3.0000 mL | Freq: Two times a day (BID) | INTRAVENOUS | Status: DC
  Administered 2015-09-28 – 2015-10-03 (×10): 3 mL via INTRAVENOUS

## 2015-09-27 MED ORDER — BUPROPION HCL 75 MG PO TABS
75.0000 mg | ORAL_TABLET | Freq: Two times a day (BID) | ORAL | Status: DC
  Administered 2015-09-27 – 2015-10-03 (×12): 75 mg via ORAL
  Filled 2015-09-27 (×13): qty 1

## 2015-09-27 MED ORDER — IOPAMIDOL (ISOVUE-300) INJECTION 61%
INTRAVENOUS | Status: AC
  Administered 2015-09-27: 75 mL
  Filled 2015-09-27: qty 75

## 2015-09-27 MED ORDER — TIOTROPIUM BROMIDE MONOHYDRATE 18 MCG IN CAPS
18.0000 ug | ORAL_CAPSULE | Freq: Every day | RESPIRATORY_TRACT | Status: DC
  Administered 2015-09-28 – 2015-10-03 (×6): 18 ug via RESPIRATORY_TRACT
  Filled 2015-09-27 (×2): qty 5

## 2015-09-27 MED ORDER — SODIUM CHLORIDE 0.9% FLUSH: 3.0000 mL | INTRAVENOUS | Status: DC | PRN

## 2015-09-27 MED ORDER — OXYCODONE-ACETAMINOPHEN 5-325 MG PO TABS
1.0000 | ORAL_TABLET | Freq: Once | ORAL | Status: AC
  Administered 2015-09-27: 1 via ORAL
  Filled 2015-09-27: qty 1

## 2015-09-27 MED ORDER — PANTOPRAZOLE SODIUM 40 MG PO TBEC
40.0000 mg | DELAYED_RELEASE_TABLET | Freq: Every day | ORAL | Status: DC
  Administered 2015-09-28 – 2015-10-03 (×6): 40 mg via ORAL
  Filled 2015-09-27 (×6): qty 1

## 2015-09-27 MED ORDER — BISACODYL 10 MG RE SUPP: 10.0000 mg | Freq: Every day | RECTAL | Status: DC | PRN

## 2015-09-27 MED ORDER — DEXTROSE 5 % IV SOLN
1.0000 g | Freq: Three times a day (TID) | INTRAVENOUS | Status: DC
  Filled 2015-09-27: qty 1

## 2015-09-27 MED ORDER — MIRTAZAPINE 15 MG PO TABS
15.0000 mg | ORAL_TABLET | Freq: Every day | ORAL | Status: DC
  Administered 2015-09-27 – 2015-10-02 (×6): 15 mg via ORAL
  Filled 2015-09-27 (×6): qty 1

## 2015-09-27 MED ORDER — VANCOMYCIN HCL IN DEXTROSE 750-5 MG/150ML-% IV SOLN
750.0000 mg | Freq: Two times a day (BID) | INTRAVENOUS | Status: DC
  Filled 2015-09-27: qty 150

## 2015-09-27 MED ORDER — SODIUM CHLORIDE 0.9 % IV SOLN
1500.0000 mg | Freq: Once | INTRAVENOUS | Status: AC
  Administered 2015-09-27: 1500 mg via INTRAVENOUS
  Filled 2015-09-27: qty 1500

## 2015-09-27 MED ORDER — ALBUTEROL SULFATE (2.5 MG/3ML) 0.083% IN NEBU: 5.0000 mg | INHALATION_SOLUTION | Freq: Once | RESPIRATORY_TRACT | Status: DC

## 2015-09-27 MED ORDER — ONDANSETRON HCL 4 MG/2ML IJ SOLN: 4.0000 mg | Freq: Four times a day (QID) | INTRAMUSCULAR | Status: DC | PRN

## 2015-09-27 MED ORDER — ACETAMINOPHEN 325 MG PO TABS
650.0000 mg | ORAL_TABLET | ORAL | Status: DC | PRN
  Administered 2015-09-27 – 2015-09-28 (×2): 650 mg via ORAL
  Filled 2015-09-27 (×2): qty 2

## 2015-09-27 MED ORDER — APIXABAN 2.5 MG PO TABS
2.5000 mg | ORAL_TABLET | Freq: Two times a day (BID) | ORAL | Status: DC
  Administered 2015-09-27 – 2015-10-03 (×12): 2.5 mg via ORAL
  Filled 2015-09-27 (×12): qty 1

## 2015-09-27 MED ORDER — SENNOSIDES-DOCUSATE SODIUM 8.6-50 MG PO TABS
1.0000 | ORAL_TABLET | Freq: Every day | ORAL | Status: DC | PRN
  Administered 2015-10-03: 1 via ORAL
  Filled 2015-09-27: qty 1

## 2015-09-27 MED ORDER — ALBUTEROL SULFATE (2.5 MG/3ML) 0.083% IN NEBU
3.0000 mL | INHALATION_SOLUTION | Freq: Four times a day (QID) | RESPIRATORY_TRACT | Status: DC | PRN
  Administered 2015-09-28 – 2015-10-03 (×5): 3 mL via RESPIRATORY_TRACT
  Filled 2015-09-27 (×5): qty 3

## 2015-09-27 MED ORDER — SODIUM CHLORIDE 0.9 % IV SOLN: 250.0000 mL | INTRAVENOUS | Status: DC | PRN

## 2015-09-27 MED ORDER — ADULT MULTIVITAMIN W/MINERALS CH
1.0000 | ORAL_TABLET | Freq: Every day | ORAL | Status: DC
  Administered 2015-09-28 – 2015-10-03 (×6): 1 via ORAL
  Filled 2015-09-27 (×6): qty 1

## 2015-09-27 MED ORDER — METOPROLOL SUCCINATE ER 100 MG PO TB24
100.0000 mg | ORAL_TABLET | Freq: Every day | ORAL | Status: DC
  Administered 2015-09-28 – 2015-10-03 (×7): 100 mg via ORAL
  Filled 2015-09-27 (×7): qty 1

## 2015-09-27 MED ORDER — DIGOXIN 125 MCG PO TABS
0.0625 mg | ORAL_TABLET | Freq: Every day | ORAL | Status: DC
  Administered 2015-09-28 – 2015-10-03 (×7): 0.0625 mg via ORAL
  Filled 2015-09-27 (×7): qty 1

## 2015-09-27 MED ORDER — ASPIRIN 81 MG PO CHEW
81.0000 mg | CHEWABLE_TABLET | Freq: Every day | ORAL | Status: DC
  Administered 2015-09-28 – 2015-10-03 (×6): 81 mg via ORAL
  Filled 2015-09-27 (×6): qty 1

## 2015-09-27 MED ORDER — VITAMIN D 1000 UNITS PO TABS
1000.0000 [IU] | ORAL_TABLET | Freq: Every day | ORAL | Status: DC
Start: 2015-09-28 — End: 2015-10-03
  Administered 2015-09-28 – 2015-10-03 (×6): 1000 [IU] via ORAL
  Filled 2015-09-27 (×6): qty 1

## 2015-09-27 MED ORDER — DEXTROSE 5 % IV SOLN
2.0000 g | Freq: Once | INTRAVENOUS | Status: AC
  Administered 2015-09-27: 2 g via INTRAVENOUS
  Filled 2015-09-27: qty 2

## 2015-09-27 MED ORDER — VANCOMYCIN HCL IN DEXTROSE 1-5 GM/200ML-% IV SOLN: 1000.0000 mg | Freq: Once | INTRAVENOUS | Status: DC

## 2015-09-27 NOTE — Telephone Encounter (Signed)
[place], [place] with [facility] healthcare called with update  (CB# [phone]).  They have given 1 nitro which provided mild relief of patients chest pain.  They will be doing a chest x-ray and blood work to help determine next steps in providing care.

## 2015-09-27 NOTE — ED Notes (Signed)
Provider at bedside

## 2015-09-27 NOTE — Telephone Encounter (Signed)
Rec'd call from patient, currently in rehab at [facility] rehab, pt complaining of increasing SOB and tightness in his chest that began last night.  Pt states he has reported this to the staff at rehab without any changes to his care.  Has not had any inhalers recently.  Patient is alert and oriented duriing our conversation.  Pt recently discharged after hypercapneic respiratory failure so this is concerning.  After confirming with Dr. Dareen Piano, I have called and spoken with Colletta Maryland who is the nurse for [name], I have advised her of patients complaints, requested they have MD assess patient and proceed with next appropriate steps for patients condition.  Colletta Maryland reports their doctor is not in yet but she was going to assess now.  She has my number to call me back with an update.

## 2015-09-27 NOTE — Telephone Encounter (Signed)
Thank you Leigh.. Please follow up with them as to the outcome

## 2015-09-27 NOTE — Progress Notes (Addendum)
Pharmacy Antibiotic Note  [name] is a 70 y.o. male admitted on 09/27/2015 with shortness of breath. Patient was recently discharged to skilled nursing facility after inpatient IV antibiotic treatment. CXR in ED concerning for HCAP. Pharmacy has been consulted for Cefepime and Vancomycin dosing.  WBC 11.8, afebrile, SCr 1.44 (seems to be baseline), CrCl ~ 51 mL/min   Plan: -Vancomycin 1500 mg  IV load followed by Vancomycin 750 mg IV Q 12 hours -Cefepime 1 gm IV Q 8 hours -Monitor CBC, renal fx, cultures and clinical progress -VT at SS   Height: 6\' 3"  (190.5 cm) Weight: 171 lb (77.565 kg) IBW/kg (Calculated) : 84.5  Temp (24hrs), Avg:98.5 F (36.9 C), Min:98.3 F (36.8 C), Max:98.7 F (37.1 C)   Recent Labs Lab 09/21/15 0749 09/22/15 0440 09/27/15 1448 09/27/15 1525  WBC 6.7 6.6 11.8*  --   CREATININE 1.42*  --  1.44*  --   LATICACIDVEN  --   --   --  1.14    Estimated Creatinine Clearance: 51.6 mL/min (by C-G formula based on Cr of 1.44).    Allergies  Allergen Reactions  . Amiodarone Other (See Comments)    Thyroiditis in the setting of amiodarone use  . Penicillins Rash    Antimicrobials this admission: 5/10 Vanc>> 5/10 Cefepime>>   Dose adjustments this admission: None   Microbiology results: No cx drawn yet   Thank you for allowing pharmacy to be a part of this patient's care.  Albertina Parr, PharmD., BCPS Clinical Pharmacist Pager [phone]

## 2015-09-27 NOTE — Telephone Encounter (Signed)
Thank you for the update and for your help Leigh.

## 2015-09-27 NOTE — ED Notes (Signed)
Call to 28M, RN asked for call back for report in 15 minutes on [phone]

## 2015-09-27 NOTE — Telephone Encounter (Signed)
Spoke with [place], NP.  Patient is in route or already here to be evaluated.  He refused all diagnostics at [facility].

## 2015-09-27 NOTE — ED Notes (Signed)
Patient transported to CT 

## 2015-09-27 NOTE — Progress Notes (Signed)
Telemetry placed at 8:15.  Patient is in Atrial Fibrillation.  Dr. Randell Patient notified.  To continue monitor pt.

## 2015-09-27 NOTE — Telephone Encounter (Signed)
Thank you for the followup.

## 2015-09-27 NOTE — ED Notes (Signed)
Onset shortness of breath one day ago continued today and states been in and out of hospital since 07/28/2015.  Discharged 5 days ago and sent to ED for evaluation from [facility] facility.

## 2015-09-27 NOTE — ED Provider Notes (Signed)
CSN: [id number]     Arrival date & time 09/27/15  1417 History   First MD Initiated Contact with Patient 09/27/15 1436     Chief Complaint  Patient presents with  . Shortness of Breath    HPI   70 year old male presents today with complaints of shortness of breath. Patient was most recently discharged from internal medicine hospital service on 09/22/2015 with a diagnosis of acute on chronic respiratory failure, acute encephalopathy. Patient had presented with respiratory distress, with sepsis. Patient was treated inpatient with IV antibiotics, these were DC'd prior to discharge. Patient was discharged to skilled nursing facility. Patient reports at the time of discharge he was feeling better, but notes several days after discharge she developed centralized chest pain, shortness of breath, and fatigue. Patient reports he's had intermittent cough, sometimes productive, other times nonproductive. Patient denies any significant fevers, abdominal pain, lower extremity swelling or edema.   Past Medical History  Diagnosis Date  . Hypertension   . Chronic systolic heart failure ([place])     a. NICM - EF 35% with normal cors 2003. b. Last echo 11/2012 - EF 25-30%.  . Depression   . GERD (gastroesophageal reflux disease)   . Portal vein thrombosis   . Avascular necrosis of bones of both hips ([facility])     Status post bilateral hip replacements  . Gastritis   . Alcohol abuse   . Erectile dysfunction   . Bipolar disorder ([place])   . Hydrocele, unspecified   . Lung nodule     Chest CT scan on 12/14/2008 showed a nodular opacity at the left lung base felt to most likely represent scarring.  Follow-up chest CT scan on 06/02/2010 showed parenchymal scarring in the left apex, left  lower lobe, and lingula; some of this scarring at the left base had a nodular appearance, unchanged. Chest 09/2012 - stable.  . 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  Thyroid scan 08/28/2009  was normal with no focal areas of abnormal increased or decreased activity seen; the uptake of I 131 sodium iodide at 24 hours was 5.7%.  TSH and free T4 normalized by 08/16/2009.  . Intestinal obstruction ([place])   . COPD (chronic obstructive pulmonary disease) ([place])   . E coli bacteremia   . DVT (deep venous thrombosis) ([place])     He has hypercoagulability with multiple prior DVTs  . Pleural plaque     H/o asbestos exposure. Chest CT on 06/02/2010 showed stable extensive calcified pleural plaques involving the left hemithorax, consistent with asbestos related pleural disease.  . Weight loss     Normal colonoscopy by Dr. Olevia Perches on 11/06/2010.  . 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 b. St. Jude gen change 2010.  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  . Noncompliance   . Thrombocytopenia ([place])   . Osteoarthritis   . Spondylosis   . Anxiety   . CKD (chronic kidney disease)     Renal U/S 12/04/2009 showed no pathological findings. Labs 12/04/2009 include normal ESR, C3, C4; neg ANA; SPEP showed nonspecific increase in the alpha-2 region with no M-spike; UPEP showed no monoclonal free light chains; urine IFE showed polyclonal increase in feree Kappa and/or free Lambda light chains. Baseline Cr reported 1.7-2.5.  . STEMI (ST elevation myocardial infarction) ([place]) 10/11/2014  . Hyperlipemia   .  HCAP (healthcare-associated pneumonia) 08/24/2015  . On home oxygen therapy     "3L periodically" (08/24/2015)   Past Surgical History  Procedure Laterality Date  . Total hip arthroplasty Right 03/08/2008     By Dr. Hiram Comber  . Tee without cardioversion N/A 05/27/2013    Procedure: TRANSESOPHAGEAL ECHOCARDIOGRAM (TEE);  Surgeon: Candee Furbish, MD;  Location[facility] ENDOSCOPY;  Service: Cardiovascular;  Laterality: N/A;  . Cardioversion N/A 05/27/2013    Procedure:  CARDIOVERSION;  Surgeon: Candee Furbish, MD;  Location: [facility] ENDOSCOPY;  Service: Cardiovascular;  Laterality: N/A;  . Cardiac catheterization  2003    Nl cors, EF 35%  . Embolectomy Right 12/10/2013    Procedure: Right Femoral Embolectomy; Fasciotomy Right Lower Leg with Intraoperative arteriogram;  Surgeon: Rosetta Posner, MD;  Location: [place];  Service: Vascular;  Laterality: Right;  . Intraoperative arteriogram Right 12/10/2013    Procedure: INTRA OPERATIVE ARTERIOGRAM;  Surgeon: Rosetta Posner, MD;  Location: [place];  Service: Vascular;  Laterality: Right;  . Fasciotomy closure Right 12/15/2013    Procedure: LEG FASCIOTOMY CLOSURE;  Surgeon: Rosetta Posner, MD;  Location: [facility] OR;  Service: Vascular;  Laterality: Right;  . Embolectomy Left 10/12/2014    Procedure: Left Femoral Thrombectomy with intraoperative arteriogram;  Surgeon: Rosetta Posner, MD;  Location: [facility] OR;  Service: Vascular;  Laterality: Left;  . Hemiarthroplasty hip Left 09/09/2002    bipolar; Dr. Lafayette Dragon  . Joint replacement    . Insert / replace / remove pacemaker  02/2003    Dr. Roe Rutherford, [place] pulse generator identity SR model [id number] serial [id number]; gen change by Greggory Brandy  . Insert / replace / remove pacemaker  06/17/2008    Lead and generator change w/ St. Jude Medical Tendril ST model [id number] (serial  [id number]).        Family History  Problem Relation Age of Onset  . Hypertension Mother   . Cancer Brother     Unsure of type.  . Asthma Mother   . Coronary artery disease Mother   . Colon cancer Neg Hx   . Lung cancer Neg Hx   . Prostate cancer Neg Hx   . Stroke      mother  . Heart attack      brother and sister ?age    Social History  Substance Use Topics  . Smoking status: Former Smoker -- 0.33 packs/day for 54 years    Types: Cigarettes    Quit date: 10/11/2014  . Smokeless tobacco: Never Used  . Alcohol Use: No     Comment: 6/22//2016 "h/o etoh abuse; stopped drinking ~ 2012"    Review of Systems   All other systems reviewed and are negative.   Allergies  Amiodarone and Penicillins  Home Medications   Prior to Admission medications   Medication Sig Start Date End Date Taking? Authorizing Provider  albuterol (VENTOLIN HFA) 108 (90 BASE) MCG/ACT inhaler Inhale 2 puffs into the lungs every 6 (six) hours as needed for wheezing or shortness of breath. 01/06/15  Yes Nischal Dareen Piano, MD  apixaban (ELIQUIS) 2.5 MG TABS tablet Take 1 tablet (2.5 mg total) by mouth 2 (two) times daily. 08/08/15  Yes Zada Finders, MD  aspirin 81 MG chewable tablet Chew 1 tablet (81 mg total) by mouth daily. 11/17/14  Yes Rushil Sherrye Payor, MD  budesonide (PULMICORT) 0.25 MG/2ML nebulizer solution Take 2 mLs (0.25 mg total) by nebulization 2 (two) times daily. 08/08/15  Yes Zada Finders, MD  buPROPion (WELLBUTRIN) 75 MG tablet Take 1 tablet (75 mg total) by mouth 2 (two) times daily. 01/06/15  Yes Aldine Contes, MD  cholecalciferol (VITAMIN D) 1000 units tablet Take 1,000 Units by mouth daily.   Yes Historical Provider, MD  digoxin 62.5 MCG TABS Take 0.0625 mg by mouth daily. 08/18/15  Yes Zada Finders, MD  diltiazem (CARDIZEM CD) 120 MG 24 hr capsule Take 1 capsule (120 mg total) by mouth daily. 08/19/15  Yes Zada Finders, MD  furosemide (LASIX) 40 MG tablet Take 1 tablet (40 mg total) by mouth daily as needed for edema (for 5 days). Patient taking differently: Take 40 mg by mouth daily as needed for fluid or edema.  09/04/15  Yes Thompson Grayer, MD  levalbuterol Penne Lash) 0.63 MG/3ML nebulizer solution Take 0.63 mg by nebulization 3 (three) times daily.   Yes Historical Provider, MD  metoprolol succinate (TOPROL-XL) 100 MG 24 hr tablet Take 100 mg by mouth daily. Take with or immediately following a meal.   Yes Historical Provider, MD  mirtazapine (REMERON) 15 MG tablet TAKE 1 TABLET BY MOUTH AT BEDTIME 07/25/15  Yes Zada Finders, MD  Multiple Vitamin (MULTIVITAMIN WITH MINERALS) TABS tablet Take 1 tablet by mouth daily.    Yes Historical Provider, MD  omeprazole (PRILOSEC) 40 MG capsule Take 1 capsule (40 mg total) by mouth daily. 01/06/15  Yes Nischal Dareen Piano, MD  rosuvastatin (CRESTOR) 20 MG tablet Take 1 tablet (20 mg total) by mouth daily at 6 PM. 01/06/15  Yes Nischal Narendra, MD  senna-docusate ([employer] SENNA PLUS) 8.6-50 MG tablet Take 1 tablet by mouth daily as needed for moderate constipation. 08/08/15  Yes Zada Finders, MD  tiotropium (SPIRIVA) 18 MCG inhalation capsule Place 1 capsule (18 mcg total) into inhaler and inhale daily. 01/06/15  Yes Nischal Dareen Piano, MD  bisacodyl (DULCOLAX) 10 MG suppository Place 1 suppository (10 mg total) rectally daily as needed for severe constipation. 01/06/15   Aldine Contes, MD  diltiazem (CARDIZEM) 30 MG tablet Take one tablet by mouth every 4-6 hours as needed for palpitations and or heart rate >100 09/04/15   Thompson Grayer, MD  Urea 37.5 % CREA Apply 1 application topically daily as needed (for irritation). 08/08/15   Zada Finders, MD   BP 117/96 mmHg  Pulse 68  Temp(Src) 98.7 F (37.1 C) (Oral)  Resp 22  Ht '6\' 3"'  (1.905 m)  Wt 77.565 kg  BMI 21.37 kg/m2  SpO2 100%   Physical Exam  Constitutional: He is oriented to person, place, and time. He appears well-developed and well-nourished.  HENT:  Head: Normocephalic and atraumatic.  Eyes: Conjunctivae are normal. Pupils are equal, round, and reactive to light. Right eye exhibits no discharge. Left eye exhibits no discharge. No scleral icterus.  Neck: Normal range of motion. No JVD present. No tracheal deviation present.  Cardiovascular: Normal rate, regular rhythm, normal heart sounds and intact distal pulses.  Exam reveals no friction rub.   No murmur heard. Pulmonary/Chest: Effort normal and breath sounds normal. No stridor. No respiratory distress. He exhibits no tenderness.  Crackles in left upper lobe  Musculoskeletal: Normal range of motion. He exhibits no edema or tenderness.  Neurological: He is alert and  oriented to person, place, and time. Coordination normal.  Skin: Skin is warm and dry. No rash noted. No erythema. No pallor.  Psychiatric: He has a normal mood and affect. His behavior is normal. Judgment and thought content normal.  Nursing note and vitals reviewed.   ED Course  Procedures (including critical care time) Labs Review Labs Reviewed  CBC - Abnormal; Notable for the following:    WBC 11.8 (*)    Hemoglobin 12.7 (*)    All other components within normal limits  COMPREHENSIVE METABOLIC PANEL - Abnormal; Notable for the following:    Glucose, Bld 133 (*)    Creatinine, Ser 1.44 (*)    Total Protein 6.0 (*)    Albumin 3.0 (*)    GFR calc non Af Amer 47 (*)    GFR calc Af Amer 55 (*)    All other components within normal limits  BRAIN NATRIURETIC PEPTIDE - Abnormal; Notable for the following:    B Natriuretic Peptide 1590.1 (*)    All other components within normal limits  PROCALCITONIN  DIFFERENTIAL  TROPONIN I  TROPONIN I  I-STAT TROPOININ, ED  I-STAT CG4 LACTIC ACID, ED    Imaging Review Dg Chest 2 View  09/27/2015  CLINICAL DATA:  Shortness of Breath EXAM: CHEST  2 VIEW COMPARISON:  09/21/2015 FINDINGS: Cardiomediastinal silhouette is stable. Dual lead cardiac pacemaker is unchanged in position. There is interval small to moderate right pleural effusion with right basilar atelectasis or infiltrate. Stable left pleural effusion with left basilar atelectasis. Stable patchy airspace disease left perihilar and left upper lobe. Persistent patchy airspace is right perihilar probable multifocal pneumonia. IMPRESSION: There is interval small to moderate right pleural effusion with right basilar atelectasis or infiltrate. Stable left pleural effusion with left basilar atelectasis. Stable patchy airspace disease left perihilar and left upper lobe. Persistent patchy airspace is right perihilar probable multifocal pneumonia. Electronically Signed   By: Lahoma Crocker M.D.   On:  09/27/2015 15:07   I have personally reviewed and evaluated these images and lab results as part of my medical decision-making.   EKG Interpretation None      MDM   Final diagnoses:  Dyspnea    Labs: Lactic acid, i-STAT troponin, BNP  Imaging:  Consults: Internal medicine  Therapeutics: Vancomycin, cefepime, oxycodone  Discharge Meds:   Assessment/Plan: 70 year old male presents today with complaints of shortness of breath. Patient has a significant past medical history of respiratory failure, COPD, recurrent pneumonia. Patient was recently discharged, has returning symptoms consistent with pneumonia. Patient will require hospital admission for IV antibiotics and further management. His vital signs have remained stable while here in the ED.        Okey Regal, PA-C 09/27/15 1821  Julianne Rice, MD 09/28/15 1444

## 2015-09-27 NOTE — H&P (Signed)
Date: 09/27/2015               Patient Name:  [name] MRN: [id number]  DOB: 19-Dec-1945 Age / Sex: 70 y.o., male   PCP: Zada Finders, MD         Medical Service: Internal Medicine Teaching Service         Attending Physician: Dr. Annia Belt, MD    First Contact: Dr. Merrilyn Puma Pager: [phone]  Second Contact: Dr. Randell Patient Pager: [phone]       After Hours (After 5p/  First Contact Pager: [phone]  weekends / holidays): Second Contact Pager: [phone]   Chief Complaint: shortness of breath.  History of Present Illness:  Pt with HTN, chronic systolic heart failure EF 30% 07/2015, GERD, depression, bipolar, COPD on home 3 L o2, and CKD who was recently discharged on 5/5. Pt was at [facility] and he was having increasing shortness of breath and tightness in the chest.   He denies fevers or chills but he reports intermittent cough with white sputum production without blood. He has orthopnea, PND, and uses 1 pillow at night. His baseline weight prior to admission last month was 146 lbs, and today it was 171 lbs. He denies taking lasix since he was discharged.  He denies fevers, abd pain, LE swelling or edema.  He says that he did not receive his gabapentin once he was discharged from here.  He noticed his oxygen requirement has gone up from 3 to 4 L.  He also reports pleuritic chest pain, in mid-sternal area that started yesterday. The pain does not radiate anywhere and it comes and goes. He associates with shortness of breath. The pain is present on palpation. He denies heartburn.   SHx: He has not used any of his inhalers in a while.  He has not smoked since March. Does not do illicit drugs.  5/2-5/5- He was recently admitted for acute on chronic hypercapneic failure. He was d/c from SNF on 4/30 and was home for 1 day before his wife had found him with jerk-like movements. He had required biPap and ICU admission and was transferred to IMTS on 5/4. Vanc was d/ced and cefepime  was changed to clindamycin given the possibility of aspiration pneumonia given AMS. PT recommended returning to SNF. Prior to that he was admitted 3/25 to 3/31 for PNA and afib with RVR.    Meds: Current Facility-Administered Medications  Medication Dose Route Frequency Provider Last Rate Last Dose  . [START ON 09/28/2015] ceFEPIme (MAXIPIME) 1 g in dextrose 5 % 50 mL IVPB  1 g Intravenous Q8H Darnell Level Mancheril, RPH      . vancomycin (VANCOCIN) 1,500 mg in sodium chloride 0.9 % 500 mL IVPB  1,500 mg Intravenous Once Lavenia Atlas, RPH 250 mL/hr at 09/27/15 1720 1,500 mg at 09/27/15 1720  . [START ON 09/28/2015] vancomycin (VANCOCIN) IVPB 750 mg/150 ml premix  750 mg Intravenous Q12H Lavenia Atlas, [facility]       Current Outpatient Prescriptions  Medication Sig Dispense Refill  . albuterol (VENTOLIN HFA) 108 (90 BASE) MCG/ACT inhaler Inhale 2 puffs into the lungs every 6 (six) hours as needed for wheezing or shortness of breath. 3 Inhaler 3  . apixaban (ELIQUIS) 2.5 MG TABS tablet Take 1 tablet (2.5 mg total) by mouth 2 (two) times daily. 60 tablet 3  . aspirin 81 MG chewable tablet Chew 1 tablet (81 mg total) by mouth daily. 30 tablet 12  .  budesonide (PULMICORT) 0.25 MG/2ML nebulizer solution Take 2 mLs (0.25 mg total) by nebulization 2 (two) times daily. 60 mL 12  . buPROPion (WELLBUTRIN) 75 MG tablet Take 1 tablet (75 mg total) by mouth 2 (two) times daily. 180 tablet 3  . cholecalciferol (VITAMIN D) 1000 units tablet Take 1,000 Units by mouth daily.    . digoxin 62.5 MCG TABS Take 0.0625 mg by mouth daily. 30 tablet 2  . diltiazem (CARDIZEM CD) 120 MG 24 hr capsule Take 1 capsule (120 mg total) by mouth daily. 30 capsule 2  . furosemide (LASIX) 40 MG tablet Take 1 tablet (40 mg total) by mouth daily as needed for edema (for 5 days). (Patient taking differently: Take 40 mg by mouth daily as needed for fluid or edema. ) 30 tablet 0  . levalbuterol (XOPENEX) 0.63 MG/3ML nebulizer  solution Take 0.63 mg by nebulization 3 (three) times daily.    . metoprolol succinate (TOPROL-XL) 100 MG 24 hr tablet Take 100 mg by mouth daily. Take with or immediately following a meal.    . mirtazapine (REMERON) 15 MG tablet TAKE 1 TABLET BY MOUTH AT BEDTIME 30 tablet 5  . Multiple Vitamin (MULTIVITAMIN WITH MINERALS) TABS tablet Take 1 tablet by mouth daily.    [name] omeprazole (PRILOSEC) 40 MG capsule Take 1 capsule (40 mg total) by mouth daily. 90 capsule 3  . rosuvastatin (CRESTOR) 20 MG tablet Take 1 tablet (20 mg total) by mouth daily at 6 PM. 90 tablet 3  . senna-docusate ([employer] SENNA PLUS) 8.6-50 MG tablet Take 1 tablet by mouth daily as needed for moderate constipation. 360 tablet 3  . tiotropium (SPIRIVA) 18 MCG inhalation capsule Place 1 capsule (18 mcg total) into inhaler and inhale daily. 90 capsule 3  . bisacodyl (DULCOLAX) 10 MG suppository Place 1 suppository (10 mg total) rectally daily as needed for severe constipation. 12 suppository 5  . diltiazem (CARDIZEM) 30 MG tablet Take one tablet by mouth every 4-6 hours as needed for palpitations and or heart rate >100 30 tablet 3  . Urea 37.5 % CREA Apply 1 application topically daily as needed (for irritation). 142 g 0    Allergies: Allergies as of 09/27/2015 - Review Complete 09/27/2015  Allergen Reaction Noted  . Amiodarone Other (See Comments) 07/30/2015  . Penicillins Rash    Past Medical History  Diagnosis Date  . Hypertension   . Chronic systolic heart failure ([place])     a. NICM - EF 35% with normal cors 2003. b. Last echo 11/2012 - EF 25-30%.  . Depression   . GERD (gastroesophageal reflux disease)   . Portal vein thrombosis   . Avascular necrosis of bones of both hips (HCC)     Status post bilateral hip replacements  . Gastritis   . Alcohol abuse   . Erectile dysfunction   . Bipolar disorder ([place])   . Hydrocele, unspecified   . Lung nodule     Chest CT scan on 12/14/2008 showed a nodular opacity at the left lung  base felt to most likely represent scarring.  Follow-up chest CT scan on 06/02/2010 showed parenchymal scarring in the left apex, left  lower lobe, and lingula; some of this scarring at the left base had a nodular appearance, unchanged. Chest 09/2012 - stable.  . 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  Thyroid scan 08/28/2009 was normal with no focal areas of abnormal increased or decreased activity seen; the uptake of  I 131 sodium iodide at 24 hours was 5.7%.  TSH and free T4 normalized by 08/16/2009.  . Intestinal obstruction (HCC)   . COPD (chronic obstructive pulmonary disease) (HCC)   . E coli bacteremia   . DVT (deep venous thrombosis) (HCC)     He has hypercoagulability with multiple prior DVTs  . Pleural plaque     H/o asbestos exposure. Chest CT on 06/02/2010 showed stable extensive calcified pleural plaques involving the left hemithorax, consistent with asbestos related pleural disease.  . Weight loss     Normal colonoscopy by Dr. Brodie on 11/06/2010.  . Tachycardia-bradycardia syndrome (HCC)     a. 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 b. St. Jude gen change 2010.  . 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  . Noncompliance   . Thrombocytopenia (HCC)   . Osteoarthritis   . Spondylosis   . Anxiety   . CKD (chronic kidney disease)     Renal U/S 12/04/2009 showed no pathological findings. Labs 12/04/2009 include normal ESR, C3, C4; neg ANA; SPEP showed nonspecific increase in the alpha-2 region with no M-spike; UPEP showed no monoclonal free light chains; urine IFE showed polyclonal increase in feree Kappa and/or free Lambda light chains. Baseline Cr reported 1.7-2.5.  . STEMI (ST elevation myocardial infarction) (HCC) 10/11/2014  . Hyperlipemia   . HCAP (healthcare-associated pneumonia) 08/24/2015  . On home oxygen therapy     "3L periodically"  (08/24/2015)   Past Surgical History  Procedure Laterality Date  . Total hip arthroplasty Right 03/08/2008     By Dr. Edgar Paul  . Tee without cardioversion N/A 05/27/2013    Procedure: TRANSESOPHAGEAL ECHOCARDIOGRAM (TEE);  Surgeon: Mark Skains, MD;  Location: [facility];  Service: Cardiovascular;  Laterality: N/A;  . Cardioversion N/A 05/27/2013    Procedure: CARDIOVERSION;  Surgeon: Mark Skains, MD;  Location: [facility];  Service: Cardiovascular;  Laterality: N/A;  . Cardiac catheterization  2003    Nl cors, EF 35%  . Embolectomy Right 12/10/2013    Procedure: Right Femoral Embolectomy; Fasciotomy Right Lower Leg with Intraoperative arteriogram;  Surgeon: Todd F Early, MD;  Location: [facility] OR;  Service: Vascular;  Laterality: Right;  . Intraoperative arteriogram Right 12/10/2013    Procedure: INTRA OPERATIVE ARTERIOGRAM;  Surgeon: Todd F Early, MD;  Location: [facility] OR;  Service: Vascular;  Laterality: Right;  . Fasciotomy closure Right 12/15/2013    Procedure: LEG FASCIOTOMY CLOSURE;  Surgeon: Todd F Early, MD;  Location: [facility] OR;  Service: Vascular;  Laterality: Right;  . Embolectomy Left 10/12/2014    Procedure: Left Femoral Thrombectomy with intraoperative arteriogram;  Surgeon: Todd F Early, MD;  Location: [facility] OR;  Service: Vascular;  Laterality: Left;  . Hemiarthroplasty hip Left 09/09/2002    bipolar; Dr. Philip Carter  . Joint replacement    . Insert / replace / remove pacemaker  02/2003    Dr. John Tysinger, St. Jude Medical pulse generator identity SR model #5172 serial #[id number]; gen change by JA  . Insert / replace / remove pacemaker  06/17/2008    Lead and generator change w/ St. Jude Medical Tendril ST model 1788TC-58 (serial  #[id number]).        Family History  Problem Relation Age of Onset  . Hypertension Mother   . Cancer Brother     Unsure of type.  . Asthma Mother   . Coronary artery disease Mother   .   Colon cancer Neg Hx   . Lung cancer Neg Hx   . Prostate cancer Neg Hx   .  Stroke      mother  . Heart attack      brother and sister ?age    Social History   Social History  . Marital Status: Widowed    Spouse Name: N/A  . Number of Children: 10  . Years of Education: N/A   Occupational History  . RETIRED    Social History Main Topics  . Smoking status: Former Smoker -- 0.33 packs/day for 54 years    Types: Cigarettes    Quit date: 10/11/2014  . Smokeless tobacco: Never Used  . Alcohol Use: No     Comment: 6/22//2016 "h/o etoh abuse; stopped drinking ~ 2012"  . Drug Use: No     Comment: 11/09/2014 "none since 10/11/2014; did smoke marijuana ~ 1 time/month"  . Sexual Activity: Not Currently   Other Topics Concern  . Not on file   Social History Narrative   Works as a taxi driver part time   Smoker 1 pack last 1.5 days tobacco, smokes marijuana    Denies EtOH x 4 years (on 10/12/12)   9 kids    From [place]   [place] [employer]    12 grade education              Review of Systems: Pertinent items noted in HPI and remainder of comprehensive ROS otherwise negative.  Physical Exam: Blood pressure 117/96, pulse 68, temperature 98.7 F (37.1 C), temperature source Oral, resp. rate 22, height 6' 3" (1.905 m), weight 171 lb (77.565 kg), SpO2 100 %.  General:  A&O, in NAD., on 4L oxygen HEENT: PERRLA, no ear/nose/throat erythema or exudates Neck: supple, no lymphadenopathy, has JVD  CV: irregularly irregular rhythm in afib, no murmurs or rubs appreciated. Resp: equal and symmetric breath sounds, no wheezing or crackles. Abdomen: soft, nontender, nondistended, +BS in all 4 quadrants Skin: dry skin Extremities: pulses intact b/l, no edema Neurologic: Patient is alert and oriented and no gross deficits    Lab results: Results for orders placed or performed during the hospital encounter of 09/27/15 (from the past 24 hour(s))  CBC     Status: Abnormal   Collection Time: 09/27/15  2:48 PM  Result Value Ref Range   WBC 11.8 (H) 4.0 - 10.5  K/uL   RBC 4.37 4.22 - 5.81 MIL/uL   Hemoglobin 12.7 (L) 13.0 - 17.0 g/dL   HCT 40.5 39.0 - 52.0 %   MCV 92.7 78.0 - 100.0 fL   MCH 29.1 26.0 - 34.0 pg   MCHC 31.4 30.0 - 36.0 g/dL   RDW 15.4 11.5 - 15.5 %   Platelets 193 150 - 400 K/uL  Comprehensive metabolic panel     Status: Abnormal   Collection Time: 09/27/15  2:48 PM  Result Value Ref Range   Sodium 143 135 - 145 mmol/L   Potassium 4.8 3.5 - 5.1 mmol/L   Chloride 108 101 - 111 mmol/L   CO2 28 22 - 32 mmol/L   Glucose, Bld 133 (H) 65 - 99 mg/dL   BUN 19 6 - 20 mg/dL   Creatinine, Ser 1.44 (H) 0.61 - 1.24 mg/dL   Calcium 9.0 8.9 - 10.3 mg/dL   Total Protein 6.0 (L) 6.5 - 8.1 g/dL   Albumin 3.0 (L) 3.5 - 5.0 g/dL   AST 36 15 - 41 U/L   ALT 36 17 -   63 U/L   Alkaline Phosphatase 79 38 - 126 U/L   Total Bilirubin 0.7 0.3 - 1.2 mg/dL   GFR calc non Af Amer 47 (L) >60 mL/min   GFR calc Af Amer 55 (L) >60 mL/min   Anion gap 7 5 - 15  Brain natriuretic peptide (only with dyspnea)     Status: Abnormal   Collection Time: 09/27/15  3:02 PM  Result Value Ref Range   B Natriuretic Peptide 1590.1 (H) 0.0 - 100.0 pg/mL  I-stat troponin, ED (not at [facility], [facility])     Status: None   Collection Time: 09/27/15  3:13 PM  Result Value Ref Range   Troponin i, poc 0.04 0.00 - 0.08 ng/mL   Comment 3          I-Stat CG4 Lactic Acid, ED     Status: None   Collection Time: 09/27/15  3:25 PM  Result Value Ref Range   Lactic Acid, Venous 1.14 0.5 - 2.0 mmol/L   *Note: Due to a large number of results and/or encounters for the requested time period, some results have not been displayed. A complete set of results can be found in Results Review.    Imaging results:  Dg Chest 2 View  09/27/2015  CLINICAL DATA:  Shortness of Breath EXAM: CHEST  2 VIEW COMPARISON:  09/21/2015 FINDINGS: Cardiomediastinal silhouette is stable. Dual lead cardiac pacemaker is unchanged in position. There is interval small to moderate right pleural effusion with right  basilar atelectasis or infiltrate. Stable left pleural effusion with left basilar atelectasis. Stable patchy airspace disease left perihilar and left upper lobe. Persistent patchy airspace is right perihilar probable multifocal pneumonia. IMPRESSION: There is interval small to moderate right pleural effusion with right basilar atelectasis or infiltrate. Stable left pleural effusion with left basilar atelectasis. Stable patchy airspace disease left perihilar and left upper lobe. Persistent patchy airspace is right perihilar probable multifocal pneumonia. Electronically Signed   By: Lahoma Crocker M.D.   On: 09/27/2015 15:07    Other results: EKG: afib  Assessment & Plan by Problem: Active Problems:   Pneumonia    Dyspnea: Likely due to his underlying CHF with last echo of EF 30% in March. Pt with increased weight of about 17 lbs, JVD on exam currently in Afib which likely driving his heart failure exacerbation. BNP of 1590. He was sent home with 40 mg lasix qd but did not take a chance to take it. Also sent home with clinda.  EKG showing Afib. CXR showing stably patchy airspace in left perihilar and left upper lobe, possibly HCAP, but while WC of 11.8, he is afebrile, and is not systemically ill in any manner so unlikely to be HCAP. In the ER, he got one dose of vanc and cefepime. We will decide whether to diurese him based on the procalcitonin levels. CT chest with contrast shows extensive calcified pleural plaques L>R and small b/l pleural effusions. Also, RLL consolidation, worsened since 2015, and new bilateral hilar adenopathy.  -ordered procalcitonin, decision of further abx based on this -trend troponins  -lasix 40 mg IV once - may need pulmonary consult -albuterol  Afib: EKG showing Afib. CHADVASC2 > 1 -eliquis   HFrEF: EF of 30% march 2017 -diltiazem, digoxin, toprol -consider adding ACEi   COPD?: -pulmicort albuterol -spiriva  Vit D -Vit  D  HLD -crestor  Bipolar/psychiatric -remeron       Dispo: Disposition is deferred at this time, awaiting improvement of current medical problems. Anticipated  discharge in approximately 2 day(s).   The patient does have a current PCP (Vishal Patel, MD) and does need an [facility] hospital follow-up appointment after discharge.  The patient does not have transportation limitations that hinder transportation to clinic appointments.  Signed:  , MD 09/27/2015, 5:47 PM   

## 2015-09-28 ENCOUNTER — Other Ambulatory Visit: Payer: Self-pay | Admitting: *Deleted

## 2015-09-28 DIAGNOSIS — I420 Dilated cardiomyopathy: Secondary | ICD-10-CM

## 2015-09-28 DIAGNOSIS — Z7901 Long term (current) use of anticoagulants: Secondary | ICD-10-CM

## 2015-09-28 DIAGNOSIS — I272 Other secondary pulmonary hypertension: Secondary | ICD-10-CM

## 2015-09-28 DIAGNOSIS — J90 Pleural effusion, not elsewhere classified: Secondary | ICD-10-CM | POA: Insufficient documentation

## 2015-09-28 DIAGNOSIS — F319 Bipolar disorder, unspecified: Secondary | ICD-10-CM

## 2015-09-28 DIAGNOSIS — I481 Persistent atrial fibrillation: Secondary | ICD-10-CM

## 2015-09-28 DIAGNOSIS — J9819 Other pulmonary collapse: Secondary | ICD-10-CM

## 2015-09-28 DIAGNOSIS — I5022 Chronic systolic (congestive) heart failure: Secondary | ICD-10-CM

## 2015-09-28 DIAGNOSIS — J61 Pneumoconiosis due to asbestos and other mineral fibers: Secondary | ICD-10-CM | POA: Insufficient documentation

## 2015-09-28 DIAGNOSIS — J189 Pneumonia, unspecified organism: Secondary | ICD-10-CM

## 2015-09-28 DIAGNOSIS — Z950 Presence of cardiac pacemaker: Secondary | ICD-10-CM

## 2015-09-28 DIAGNOSIS — J449 Chronic obstructive pulmonary disease, unspecified: Secondary | ICD-10-CM

## 2015-09-28 DIAGNOSIS — E785 Hyperlipidemia, unspecified: Secondary | ICD-10-CM

## 2015-09-28 DIAGNOSIS — Z9981 Dependence on supplemental oxygen: Secondary | ICD-10-CM

## 2015-09-28 DIAGNOSIS — I712 Thoracic aortic aneurysm, without rupture: Secondary | ICD-10-CM

## 2015-09-28 DIAGNOSIS — I517 Cardiomegaly: Secondary | ICD-10-CM

## 2015-09-28 DIAGNOSIS — N529 Male erectile dysfunction, unspecified: Secondary | ICD-10-CM

## 2015-09-28 DIAGNOSIS — E559 Vitamin D deficiency, unspecified: Secondary | ICD-10-CM

## 2015-09-28 DIAGNOSIS — I509 Heart failure, unspecified: Secondary | ICD-10-CM

## 2015-09-28 DIAGNOSIS — Z9889 Other specified postprocedural states: Secondary | ICD-10-CM

## 2015-09-28 LAB — BASIC METABOLIC PANEL
Anion gap: 14 (ref 5–15)
BUN: 19 mg/dL (ref 6–20)
CHLORIDE: 103 mmol/L (ref 101–111)
CO2: 26 mmol/L (ref 22–32)
CREATININE: 1.42 mg/dL — AB (ref 0.61–1.24)
Calcium: 9.2 mg/dL (ref 8.9–10.3)
GFR calc Af Amer: 56 mL/min — ABNORMAL LOW (ref >60)
GFR calc non Af Amer: 48 mL/min — ABNORMAL LOW (ref >60)
GLUCOSE: 82 mg/dL (ref 65–99)
POTASSIUM: 4.5 mmol/L (ref 3.5–5.1)
Sodium: 143 mmol/L (ref 135–145)

## 2015-09-28 LAB — CBC
HEMATOCRIT: 39.5 % (ref 39.0–52.0)
HEMOGLOBIN: 12.5 g/dL — AB (ref 13.0–17.0)
MCH: 28.9 pg (ref 26.0–34.0)
MCHC: 31.6 g/dL (ref 30.0–36.0)
MCV: 91.4 fL (ref 78.0–100.0)
Platelets: 183 10*3/uL (ref 150–400)
RBC: 4.32 MIL/uL (ref 4.22–5.81)
RDW: 15.4 % (ref 11.5–15.5)
WBC: 10.5 10*3/uL (ref 4.0–10.5)

## 2015-09-28 LAB — TROPONIN I: Troponin I: 0.04 ng/mL — ABNORMAL HIGH (ref <0.031)

## 2015-09-28 LAB — MRSA PCR SCREENING: MRSA BY PCR: NEGATIVE

## 2015-09-28 MED ORDER — FUROSEMIDE 10 MG/ML IJ SOLN
40.0000 mg | Freq: Once | INTRAMUSCULAR | Status: AC
  Administered 2015-09-28: 40 mg via INTRAVENOUS
  Filled 2015-09-28: qty 4

## 2015-09-28 MED ORDER — FUROSEMIDE 10 MG/ML IJ SOLN
40.0000 mg | Freq: Two times a day (BID) | INTRAMUSCULAR | Status: DC
  Administered 2015-09-28: 40 mg via INTRAVENOUS
  Filled 2015-09-28: qty 4

## 2015-09-28 MED ORDER — FUROSEMIDE 10 MG/ML IJ SOLN: 40.0000 mg | Freq: Every day | INTRAMUSCULAR | Status: DC

## 2015-09-28 NOTE — Care Management Note (Signed)
Case Management Note  Patient Details  Name: [name] MRN: [id number] Date of Birth: 1945-12-10  Subjective/Objective:                    Action/Plan: Patient presented from [facility] with shortness of breath.  He was recently discharged from the hospital to SNF for short-term rehab, otherwise lives at home with spouse. Will follow for discharge needs pending patient's progress and discharge disposition.  CSW notified that patient is from SNF.  Expected Discharge Date:  09/30/15               Expected Discharge Plan:     In-House Referral:     Discharge planning Services     Post Acute Care Choice:    Choice offered to:     DME Arranged:    DME Agency:     HH Arranged:    HH Agency:     Status of Service:  In process, will continue to follow  Medicare Important Message Given:    Date Medicare IM Given:    Medicare IM give by:    Date Additional Medicare IM Given:    Additional Medicare Important Message give by:     If discussed at [place] of Stay Meetings, dates discussed:    Additional Comments:  Rolm Baptise, RN 09/28/2015, 10:19 AM [phone]

## 2015-09-28 NOTE — Progress Notes (Signed)
Name: [name] MRN: [id number] DOB: 09/30/1945    ADMISSION DATE:  09/27/2015 CONSULTATION DATE:  09/28/15  REFERRING MD :  Dr. Beryle Beams   CHIEF COMPLAINT:  Recurrent PNA    HISTORY OF PRESENT ILLNESS:  70 year old M with a PMH of HTN, systolic heart failure, DVT, HLD, prior MI, tachybradycardia syndrome, CKD, gastritis, alcohol abuse, hyperthyroidism, left fibrothorax with pleural plaques (? Asbestos related pleural disease), COPD & chronic 2L oxygen dependence who presented to [facility] on 5/10 with complaints of 24 hours of shortness of breath.  The patient was hospitalized from 3/25-3/31 for pneumonia and A. fib with RVR.  He was discharged to a SNF at that time.  On 5/1, he was discharged home.  He returned to the ER on 5/2 with acute on chronic hypercapnic respiratory failure & acute encephalopathy.  At that time he was noted to have hypercarbic respiratory failure and placed on BiPAP.  He was treated for PNA and later discharged 5/5 to [facility].    On presentation the patient reported worsening shortness of breath and cough. Initial ER evaluation included chest x-ray which demonstrated a small to moderate right pleural effusion, persistent perihilar and left upper lobe infiltrates. Initial labs - Na 143, K 4.8, Cl 108, sr cr 1.44, glucose 133, albumin 3.0, BNP 1590, troponin 0.04, WBC 10.5, hgb 12.5, and platelets 183.   He was treated with broad-spectrum antibiotics and admitted per internal medicine teaching service. CT of the chest was evaluated which demonstrated asbestos-related pleural disease including extensive calcified pleural plaques in left greater than right pleural spaces, small bilateral pleural effusions, near complete right lower lobe consolidation, volume loss, thick transplant pulmonic bandlike opacity in the periphery of the left lung and nodular pleural-based focus of consolidation in the left lower lobe concerning for progressive sequela of  asbestos-related pleural disease, new mild subcarinal and bilateral hilar adenopathy, mild cardiomegaly.  PCCM consulted 5/11 for evaluation of recurrent pneumonia.   PAST MEDICAL HISTORY :   has a past medical history of Hypertension; Chronic systolic heart failure ([place]); Depression; GERD (gastroesophageal reflux disease); Portal vein thrombosis; Avascular necrosis of bones of both hips ([place]); Gastritis; Alcohol abuse; Erectile dysfunction; Bipolar disorder ([place]); Hydrocele, unspecified; Lung nodule; Hyperthyroidism; Intestinal obstruction (HCC); COPD (chronic obstructive pulmonary disease) ([place]); E coli bacteremia; DVT (deep venous thrombosis) ([place]); Pleural plaque; Weight loss; Tachycardia-bradycardia syndrome ([place]); Thrombosis; Noncompliance; Thrombocytopenia ([place]); Osteoarthritis; Spondylosis; Anxiety; CKD (chronic kidney disease); STEMI (ST elevation myocardial infarction) ([place]) (10/11/2014); Hyperlipemia; HCAP (healthcare-associated pneumonia) (08/24/2015); On home oxygen therapy; and CHF (congestive heart failure) ([place]).  has past surgical history that includes Total hip arthroplasty (Right, 03/08/2008); TEE without cardioversion (N/A, 05/27/2013); Cardioversion (N/A, 05/27/2013); Cardiac catheterization (2003); Embolectomy (Right, 12/10/2013); Intraoperative arteriogram (Right, 12/10/2013); Fasciotomy closure (Right, 12/15/2013); Embolectomy (Left, 10/12/2014); Hemiarthroplasty hip (Left, 09/09/2002); Joint replacement; Insert / replace / remove pacemaker (02/2003); and Insert / replace / remove pacemaker (06/17/2008).    Prior to Admission medications   Medication Sig Start Date End Date Taking? Authorizing Provider  albuterol (VENTOLIN HFA) 108 (90 BASE) MCG/ACT inhaler Inhale 2 puffs into the lungs every 6 (six) hours as needed for wheezing or shortness of breath. 01/06/15  Yes Nischal Dareen Piano, MD  apixaban (ELIQUIS) 2.5 MG TABS tablet Take 1 tablet (2.5 mg total) by mouth 2 (two) times daily. 08/08/15   Yes Zada Finders, MD  aspirin 81 MG chewable tablet Chew 1 tablet (81 mg total) by mouth daily. 11/17/14  Yes Rushil Sherrye Payor, MD  budesonide (PULMICORT) 0.25 MG/2ML nebulizer solution Take 2 mLs (0.25 mg total) by nebulization 2 (two) times daily. 08/08/15  Yes Zada Finders, MD  buPROPion (WELLBUTRIN) 75 MG tablet Take 1 tablet (75 mg total) by mouth 2 (two) times daily. 01/06/15  Yes Aldine Contes, MD  cholecalciferol (VITAMIN D) 1000 units tablet Take 1,000 Units by mouth daily.   Yes Historical Provider, MD  digoxin 62.5 MCG TABS Take 0.0625 mg by mouth daily. 08/18/15  Yes Zada Finders, MD  diltiazem (CARDIZEM CD) 120 MG 24 hr capsule Take 1 capsule (120 mg total) by mouth daily. 08/19/15  Yes Zada Finders, MD  furosemide (LASIX) 40 MG tablet Take 1 tablet (40 mg total) by mouth daily as needed for edema (for 5 days). Patient taking differently: Take 40 mg by mouth daily as needed for fluid or edema.  09/04/15  Yes Thompson Grayer, MD  levalbuterol Penne Lash) 0.63 MG/3ML nebulizer solution Take 0.63 mg by nebulization 3 (three) times daily.   Yes Historical Provider, MD  metoprolol succinate (TOPROL-XL) 100 MG 24 hr tablet Take 100 mg by mouth daily. Take with or immediately following a meal.   Yes Historical Provider, MD  mirtazapine (REMERON) 15 MG tablet TAKE 1 TABLET BY MOUTH AT BEDTIME 07/25/15  Yes Zada Finders, MD  Multiple Vitamin (MULTIVITAMIN WITH MINERALS) TABS tablet Take 1 tablet by mouth daily.   Yes Historical Provider, MD  omeprazole (PRILOSEC) 40 MG capsule Take 1 capsule (40 mg total) by mouth daily. 01/06/15  Yes Nischal Dareen Piano, MD  rosuvastatin (CRESTOR) 20 MG tablet Take 1 tablet (20 mg total) by mouth daily at 6 PM. 01/06/15  Yes Nischal Narendra, MD  senna-docusate ([employer] SENNA PLUS) 8.6-50 MG tablet Take 1 tablet by mouth daily as needed for moderate constipation. 08/08/15  Yes Zada Finders, MD  tiotropium (SPIRIVA) 18 MCG inhalation capsule Place 1 capsule (18 mcg total) into inhaler  and inhale daily. 01/06/15  Yes Nischal Dareen Piano, MD  bisacodyl (DULCOLAX) 10 MG suppository Place 1 suppository (10 mg total) rectally daily as needed for severe constipation. 01/06/15   Aldine Contes, MD  diltiazem (CARDIZEM) 30 MG tablet Take one tablet by mouth every 4-6 hours as needed for palpitations and or heart rate >100 09/04/15   Thompson Grayer, MD  Urea 37.5 % CREA Apply 1 application topically daily as needed (for irritation). 08/08/15   Zada Finders, MD   Allergies  Allergen Reactions  . Amiodarone Other (See Comments)    Thyroiditis in the setting of amiodarone use  . Penicillins Rash    FAMILY HISTORY:  family history includes Asthma in his mother; Cancer in his brother; Coronary artery disease in his mother; Hypertension in his mother. There is no history of Colon cancer, Lung cancer, or Prostate cancer.   SOCIAL HISTORY:  reports that he quit smoking about a year ago. His smoking use included Cigarettes. He has a 17.82 pack-year smoking history. He has never used smokeless tobacco. He reports that he does not drink alcohol or use illicit drugs.  REVIEW OF SYSTEMS:  POSITIVES in BOLD Constitutional: Negative for fever, chills, weight loss, malaise/fatigue and diaphoresis. Reports decline in functional status.  HENT: Negative for hearing loss, ear pain, nosebleeds, congestion, sore throat, neck pain, tinnitus and ear discharge.   Eyes: Negative for blurred vision, double vision, photophobia, pain, discharge and redness.  Respiratory: Negative for cough, hemoptysis, sputum production, shortness of breath, wheezing and stridor.   Cardiovascular: Negative for chest pain, palpitations, orthopnea, claudication, leg swelling  and PND.  Gastrointestinal: Negative for occasional heartburn, nausea, vomiting, abdominal pain, diarrhea, constipation, blood in stool and melena.  Genitourinary: Negative for dysuria, urgency, frequency, hematuria and flank pain.  Musculoskeletal: Negative for  myalgias, back pain, joint pain and falls.  Skin: Negative for itching and rash.  Neurological: Negative for dizziness, tingling, tremors, sensory change, speech change, focal weakness, seizures, loss of consciousness, weakness and headaches.  Endo/Heme/Allergies: Negative for environmental allergies and polydipsia. Does not bruise/bleed easily.   SUBJECTIVE:   VITAL SIGNS: Temp:  [97.8 F (36.6 C)-98.9 F (37.2 C)] 97.8 F (36.6 C) (05/11 0556) Pulse Rate:  [65-110] 65 (05/11 0556) Resp:  [17-22] 18 (05/11 0556) BP: (112-138)/(80-106) 118/80 mmHg (05/11 0556) SpO2:  [92 %-100 %] 92 % (05/11 0754) Weight:  [174 lb 12.8 oz (79.289 kg)] 174 lb 12.8 oz (79.289 kg) (05/10 2058)  PHYSICAL EXAMINATION: General:  Chronically ill appearing adult male in NAD Neuro:  AAOx4, speech clear, MAE  HEENT:  MM pink/moist, no JVD Cardiovascular:  s1s2 rrr, no m/r/g Lungs:  Even/non-labored, lungs bilaterally clear, diminished LLL Abdomen:  Obese/soft, bsx4 active  Musculoskeletal:  No acute deformities  Skin:  Warm/dry, no edema, BLE changes c/w chronic venous stasis    Recent Labs Lab 09/27/15 1448 09/28/15 0516  NA 143 143  K 4.8 4.5  CL 108 103  CO2 28 26  BUN 19 19  CREATININE 1.44* 1.42*  GLUCOSE 133* 82    Recent Labs Lab 09/22/15 0440 09/27/15 1448 09/28/15 0516  HGB 11.8* 12.7* 12.5*  HCT 38.6* 40.5 39.5  WBC 6.6 11.8* 10.5  PLT 172 193 183   Dg Chest 2 View  09/27/2015  CLINICAL DATA:  Shortness of Breath EXAM: CHEST  2 VIEW COMPARISON:  09/21/2015 FINDINGS: Cardiomediastinal silhouette is stable. Dual lead cardiac pacemaker is unchanged in position. There is interval small to moderate right pleural effusion with right basilar atelectasis or infiltrate. Stable left pleural effusion with left basilar atelectasis. Stable patchy airspace disease left perihilar and left upper lobe. Persistent patchy airspace is right perihilar probable multifocal pneumonia. IMPRESSION: There  is interval small to moderate right pleural effusion with right basilar atelectasis or infiltrate. Stable left pleural effusion with left basilar atelectasis. Stable patchy airspace disease left perihilar and left upper lobe. Persistent patchy airspace is right perihilar probable multifocal pneumonia. Electronically Signed   By: Lahoma Crocker M.D.   On: 09/27/2015 15:07   Ct Chest W Contrast  09/27/2015  CLINICAL DATA:  Dyspnea. EXAM: CT CHEST WITH CONTRAST TECHNIQUE: Multidetector CT imaging of the chest was performed during intravenous contrast administration. CONTRAST:  1 ISOVUE-300 IOPAMIDOL (ISOVUE-300) INJECTION 61% COMPARISON:  06/02/2013 chest CT. Chest radiograph from earlier today. FINDINGS: Mediastinum/Nodes: Stable mild cardiomegaly. Two lead right subclavian pacemaker is noted with lead tips in the right ventricular apex. No pericardial fluid/thickening. Left anterior descending coronary atherosclerosis. 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 Dilated main pulmonary artery (3.6 cm diameter), increased from 3.3 cm. No central pulmonary emboli. Normal visualized thyroid. Normal esophagus. No axillary adenopathy. Mildly enlarged 1.1 cm subcarinal node (series 4/ image 71), new. Mild bilateral hilar adenopathy, new, largest 1.3 cm on the right (series 4/ image 59) and 1.1 cm on the left (series 4/image 60). Lungs/Pleura: No pneumothorax. There are extensive pleural plaques throughout the left greater than right pleural spaces, unchanged. Small right greater than left pleural effusions are increased bilaterally. There is a pleural-based 1.8 x 1.6 cm nodular focus of consolidation in the posterior left  lower lobe (series 5/ image 95) with surrounding parenchymal bands, mildly increased from 1.7 x 1.2 cm on 06/02/2013. There is a separate thick transpulmonic bandlike focus of consolidation at the periphery of the left upper and left lower lobes with associated volume loss and distortion,  significantly increased from 06/02/2013 (series 5/image 60). There is near complete consolidation and significant volume loss in the right lower lobe, significantly worsened since 06/02/2013. There is mild centrilobular emphysema and diffuse bronchial wall thickening. There is interlobular septal thickening in both lungs, predominantly in the upper lobes. Upper abdomen: Unremarkable. Musculoskeletal: No aggressive appearing focal osseous lesions. Mild degenerative changes in the thoracic spine. Symmetric mild gynecomastia. IMPRESSION: 1. Findings of asbestos related pleural disease, including extensive calcified pleural plaques in the left greater than right pleural spaces and small bilateral pleural effusions, right greater than left, increased from 2015. 2. Near complete right lower lobe consolidation, volume loss and distortion. Thick transpulmonic bandlike opacity in the periphery of the left lung and nodular pleural based focus of consolidation in the basilar left lower lobe, with associated distortion and volume loss. These findings have significantly worsened since 2015 and are favored to represent the progressive sequela of asbestos related pleural disease with rounded atelectasis and extensive lung scarring. An underlying component of pneumonia or tumor is difficult to exclude. Management options include short-term chest CT follow-up and/or PET-CT. 3. New mild subcarinal and bilateral hilar adenopathy, nonspecific. 4. Mild cardiomegaly. Increased dilatation of the main pulmonary artery, suggesting worsening pulmonary hypertension. Mild interlobular septal thickening in the upper lobes suggests a component of mild pulmonary edema. Electronically Signed   By: Ilona Sorrel M.D.   On: 09/27/2015 19:34    SIGNIFICANT EVENTS  5/10  Admit with SOB  STUDIES:  08/14/15 ECHO >> LV moderately dilated, EF 30%, diffuse hypokinesis, mild AR, mod MR, mod TR 5/10  CT Chest >> asbestos-related pleural disease  including extensive calcified pleural plaques in left greater than right pleural spaces, small bilateral pleural effusions, near complete right lower lobe consolidation, volume loss, thick transplant pulmonic bandlike opacity in the periphery of the left lung and nodular pleural-based focus of consolidation in the left lower lobe concerning for progressive sequela I of asbestos-related pleural disease, new mild subcarinal and bilateral hilar adenopathy, mild cardiomegaly.    ASSESSMENT / PLAN:  1. Dyspnea - primary complaint, likely multifactorial in the setting of COPD, CHF, deconditioning, pleural effusions and abnormal CT appearance LLL.  DDx includes fibrothorax, infectious / recurrent PNA, and malignancy.     2. Questionable Asbestos Related Pleural Plaques - noted pleural plaques on R.  Progression from 2016 CT imaging (unable to see images, per RADs).  Nodular disease noted in RLL  3. R/O PNA   4. O2 Dependent COPD   5. CHF   Plan: Continue O2 at 2L - baseline Wean O2 for sats 88-94% Assess pleural space for effusion.  Consider thoracentesis with cytology.   If above negative, may need FOB given recurrent nature of symptoms Pulmonary hygiene - IS, mobilize Intermittent CXR  Continue empiric abx for now Trend PCT Lasix 40 mg BID  Noe Gens, NP-C [facility] Pgr: [phone] or if no answer [phone] 09/28/2015, 2:33 PM

## 2015-09-28 NOTE — Patient Outreach (Signed)
[facility]) Care Management  09/28/2015  ARMARION ZELTNER Jun 12, 1945 [id number]  Patient was placed at [facility] [place], on May 5th to receive short-term rehabilitative services, after a lengthy hospital stay.  CSW received a request to provide social work services to patient on May 8th, while residing in the skilled facility; however, patient was readmitted into [facility] on May 10th, where patient continues to reside today.  Patient is currently in Atrial Fibrillation and has been placed on Telemetry. CSW made an initial attempt to try and contact patient today to perform phone assessment, as well as assess and assist with social needs and services, without success.  A HIPAA complaint message was left for patient on his cell phone voicemail.  CSW is currently awaiting a return call.  CSW will continue to follow patient while hospitalized to assess and assist with possible discharge planning needs and services. [name], BSW, MSW, LCSW  Licensed Education officer, environmental Health System  Mailing [place] [address] [place], [place] [address] Physical Address-[address], [place], [place] [address] Toll Free Main # [phone] Fax # [phone] Cell # [phone]  Fax # [phone]  [name] [employer]  Discrimination is Against the [employer]. and its subsidiaries comply with applicable Federal civil rights laws and do not discriminate on the basis of race, color, national origin, age, disability, or sex. [facility] do not exclude people or treat them differently because of race, color, national origin, age, disability, or sex.    [employer]. and its subsidiaries provide:  . Free auxiliary aids and services, such as qualified sign language interpreters, video remote interpretation, and written information in other formats to  people with disabilities when such auxiliary aids and services are necessary to ensure an equal opportunity to participate. . Free language services to people whose primary language is not English when those services are necessary to provide meaningful access, such as translated documents or oral interpretation.    If you need these services, call [phone] or if you use a TTY, call 711.   If you believe that [employer]. and its subsidiaries have failed to provide these services or discriminated in another way on the basis of race, color, national origin, age, disability, or sex, you can file a Tourist information centre manager with:   Discrimination [place] [address]   If you need help filing a grievance, call [phone] or if you use a TTY, call 711.  You can also file a civil rights complaint with the U.S. Department of Health and Financial controller, Office for Civil Rights electronically through the Office for Civil Rights Complaint Portal, available at OnSiteLending.nl.jsf, or by mail or phone at:   [place]. Department of Health and Human Services  [place], [place], [facility] Building  [place], [place]  [phone], [phone] (TDD)  Complaint forms are available at CutFunds.si             [place]: ATTENTION: If you do not speak English, language assistance services, free of charge, are available to you. Call [phone]  (TTY: [id number]).  Espaol (Spanish): ATENCIN: si habla espaol, tiene a su disposicin servicios gratuitos de asistencia lingstica. Llame al [phone] (TTY: [id number]).  ???? (Chinese): ?????????????????????????????? [phone] (TTY:711??  Ti?ng Vi?t (Vietnamese): CH : N?u b?n ni Ti?ng Vi?t, c cc d?ch v? h? tr? ngn  ng? mi?n ph dnh cho b?n. G?i s? [phone] (TTY: [id number]).  ??? ([place]): ?? : ????  ????? ?? , ?? ?? ???? ??? ???? ? ???? . [phone] (TTY: 711)??? ??? ???? .  Tagalog (Tagalog - Filipino): PAUNAWA: Kung nagsasalita ka ng Tagalog, maaari kang gumamit ng mga serbisyo ng tulong sa wika nang walang bayad. [place] sa [phone] (TTY: [id number]).   [place]): :      ,      .  [phone] (: [id number]).  Kreyl Ayisyen ([place]): ATANSYON: Si w pale Ethelene Hal, gen svis d pou lang ki disponib gratis pou ou. Rele [phone] (TTY: [id number]).  Fonnie Jarvis Marland Kitchen[place]): ATTENTION : Si vous parlez franais, des services d'aide linguistique vous sont proposs gratuitement. Appelez le [phone] (ATS : [id number]).  Polski (Polish): UWAGA: Jeeli mwisz po polsku, moesz skorzysta z bezpatnej pomocy jzykowej. Zadzwo pod numer [phone] (TTY: [id number]).  Portugus ([place]): ATENO: Se fala portugus, encontram-se disponveis servios lingusticos, grtis. Ligue para [phone] (TTY: [id number]).   Italiano ([place]): ATTENZIONE: In caso la lingua parlata sia l'italiano, sono disponibili servizi di assistenza linguistica gratuiti. Chiamare il numero [phone] (TTY: [id number]).  Dawayne Patricia ([place]): ACHTUNG: Wenn Sie Deutsch sprechen, stehen Ihnen kostenlos sprachliche Hilfsdienstleistungen zur [employer]. [place]: [phone] (TTY: [id number]).   (Arabic): [phone]   .            : .)[id number] :   (  ??? ([place]): ??????????????????????????????????[phone] ?TTY?711?????????????????  ? (Farsi): [phone]  . ?   ? ?  ? ? ?~ ?  ?    : .??  (TTY: 711)  Din Bizaad ([place]): D77 baa ak0 n7n7zin: D77 saad bee y1n7[ti'go Risa Grill, saad bee 1k1'1n7da'1wo'd66', t'11 Pricilla Loveless n1 h0l=, koj8' h0d77lnih  [phone] (TTY: [id number]).

## 2015-09-28 NOTE — Progress Notes (Signed)
Subjective: [name] had no acute events overnight. He says that he still had an episode of 'not being able to breathe' early this morning without cough or wheezing that was relieved with a breathing treatment. He feels well now, denies chest tightness or palpitations currently. He denies fevers, diaphoresis, chest pain, worsening leg swelling, or other issues at this time. He complains that he's peeing 'a lot' and would like a catheter placed so he doesn't have to get up as frequently.  Objective: Vital signs in last 24 hours: Filed Vitals:   09/28/15 0228 09/28/15 0322 09/28/15 0556 09/28/15 0754  BP: 113/84  118/80   Pulse: 106  65   Temp: 97.8 F (36.6 C)  97.8 F (36.6 C)   TempSrc: Oral  Oral   Resp: 17  18   Height:      Weight:      SpO2: 99% 100% 99% 92%   Weight change:   Intake/Output Summary (Last 24 hours) at 09/28/15 0848 Last data filed at 09/28/15 0557  Gross per 24 hour  Intake      0 ml  Output   2420 ml  Net  -2420 ml     Gen: Chronically ill-appearing, alert and oriented to person, place, and time HEENT: Oropharynx clear without erythema or exudate.  Neck: No cervical LAD, no thyromegaly or nodules, no JVD noted. CV: Normal rate, irregularly irregular rhythm, no murmurs, rubs, or gallops Pulmonary: Normal effort, dullness to percussion up to the mid right lower lobe. Decreased breath sounds at the bases with faint bibasilar crackles R>L. Egophany noted on the RLL. Abdominal: Soft, non-tender, non-distended, without rebound, guarding, or masses Extremities: Distal pulses 2+ in upper and lower extremities bilaterally, no tenderness, erythema or edema Neuro: CN II-XII grossly intact, no focal weakness or sensory deficits noted Skin: No atypical appearing moles. No rashes  Lab Results: Basic Metabolic Panel:  Recent Labs Lab 09/27/15 1448 09/28/15 0516  NA 143 143  K 4.8 4.5  CL 108 103  CO2 28 26  GLUCOSE 133* 82  BUN 19 19  CREATININE 1.44*  1.42*  CALCIUM 9.0 9.2   Liver Function Tests:  Recent Labs Lab 09/27/15 1448  AST 36  ALT 36  ALKPHOS 79  BILITOT 0.7  PROT 6.0*  ALBUMIN 3.0*   CBC:  Recent Labs Lab 09/27/15 1448 09/27/15 2139 09/28/15 0516  WBC 11.8*  --  10.5  NEUTROABS  --  7.0  --   HGB 12.7*  --  12.5*  HCT 40.5  --  39.5  MCV 92.7  --  91.4  PLT 193  --  183   Cardiac Enzymes:  Recent Labs Lab 09/27/15 2139 09/28/15 0516  TROPONINI 0.04* 0.04*   Studies/Results: Dg Chest 2 View  09/27/2015  CLINICAL DATA:  Shortness of Breath EXAM: CHEST  2 VIEW COMPARISON:  09/21/2015 FINDINGS: Cardiomediastinal silhouette is stable. Dual lead cardiac pacemaker is unchanged in position. There is interval small to moderate right pleural effusion with right basilar atelectasis or infiltrate. Stable left pleural effusion with left basilar atelectasis. Stable patchy airspace disease left perihilar and left upper lobe. Persistent patchy airspace is right perihilar probable multifocal pneumonia. IMPRESSION: There is interval small to moderate right pleural effusion with right basilar atelectasis or infiltrate. Stable left pleural effusion with left basilar atelectasis. Stable patchy airspace disease left perihilar and left upper lobe. Persistent patchy airspace is right perihilar probable multifocal pneumonia. Electronically Signed   By: Orlean Bradford.D.  On: 09/27/2015 15:07   Ct Chest W Contrast  09/27/2015  CLINICAL DATA:  Dyspnea. EXAM: CT CHEST WITH CONTRAST TECHNIQUE: Multidetector CT imaging of the chest was performed during intravenous contrast administration. CONTRAST:  1 ISOVUE-300 IOPAMIDOL (ISOVUE-300) INJECTION 61% COMPARISON:  06/02/2013 chest CT. Chest radiograph from earlier today. FINDINGS: Mediastinum/Nodes: Stable mild cardiomegaly. Two lead right subclavian pacemaker is noted with lead tips in the right ventricular apex. No pericardial fluid/thickening. Left anterior descending coronary  atherosclerosis. 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 Dilated main pulmonary artery (3.6 cm diameter), increased from 3.3 cm. No central pulmonary emboli. Normal visualized thyroid. Normal esophagus. No axillary adenopathy. Mildly enlarged 1.1 cm subcarinal node (series 4/ image 71), new. Mild bilateral hilar adenopathy, new, largest 1.3 cm on the right (series 4/ image 59) and 1.1 cm on the left (series 4/image 60). Lungs/Pleura: No pneumothorax. There are extensive pleural plaques throughout the left greater than right pleural spaces, unchanged. Small right greater than left pleural effusions are increased bilaterally. There is a pleural-based 1.8 x 1.6 cm nodular focus of consolidation in the posterior left lower lobe (series 5/ image 95) with surrounding parenchymal bands, mildly increased from 1.7 x 1.2 cm on 06/02/2013. There is a separate thick transpulmonic bandlike focus of consolidation at the periphery of the left upper and left lower lobes with associated volume loss and distortion, significantly increased from 06/02/2013 (series 5/image 60). There is near complete consolidation and significant volume loss in the right lower lobe, significantly worsened since 06/02/2013. There is mild centrilobular emphysema and diffuse bronchial wall thickening. There is interlobular septal thickening in both lungs, predominantly in the upper lobes. Upper abdomen: Unremarkable. Musculoskeletal: No aggressive appearing focal osseous lesions. Mild degenerative changes in the thoracic spine. Symmetric mild gynecomastia. IMPRESSION: 1. Findings of asbestos related pleural disease, including extensive calcified pleural plaques in the left greater than right pleural spaces and small bilateral pleural effusions, right greater than left, increased from 2015. 2. Near complete right lower lobe consolidation, volume loss and distortion. Thick transpulmonic bandlike opacity in the periphery of the left lung and  nodular pleural based focus of consolidation in the basilar left lower lobe, with associated distortion and volume loss. These findings have significantly worsened since 2015 and are favored to represent the progressive sequela of asbestos related pleural disease with rounded atelectasis and extensive lung scarring. An underlying component of pneumonia or tumor is difficult to exclude. Management options include short-term chest CT follow-up and/or PET-CT. 3. New mild subcarinal and bilateral hilar adenopathy, nonspecific. 4. Mild cardiomegaly. Increased dilatation of the main pulmonary artery, suggesting worsening pulmonary hypertension. Mild interlobular septal thickening in the upper lobes suggests a component of mild pulmonary edema. Electronically Signed   By: Ilona Sorrel M.D.   On: 09/27/2015 19:34   Assessment/Plan: 1. Dyspnea - patient with recurrent admissions for similar complaints over the past few months. Given the patient's HFrEF (EF 30% in 3/17), persistent AFib, that the patient is ~17lbs up, with JVD and bilateral crackles on exam, and did not take his furosemide as instructed on last discharge, so at least some of his presentation is likely due to cardiogenic volume overload. EKG unchanged and troponins negative here as well. He has also had a presumed diagnosis of COPD with last PFTs in 2012 showing FEV/FVC 62, moderate airflow limitation without significant bronchodilator response, normal lung volumes, decreased DLCO. Arrangements for outpatient workup have been difficulty given he has frequently been between either a SNF or hospital. Patient with  recurrent admissions for shortness of breath, most often labeled "multifocal pneumonia", CXR here shows similar findings to previous films, and CT chest on 09/27/15 revealed findings suggestive of emphysematous changes with asbestos-related pleural disease, with extensive pleural calcification and R>L pleural effusion with associated atelectasis,  which may explain his normal lung volumes 2/2 scarring. His presentation is most likely a mixed picture of a CHF exacerbation with worsening underlying lung disease that is currently not well-characterized. Given minimal improvement with several antibiotics, no WBC, and a negative procalcitonin, I believe his current symptoms are less likely due to pneumonia.  -Continue IV furosemide with strict I&Os; placed condom catheter 5/11 -Will consult pulmonology given recurrent admissions for "PNA" for further characterization and management of his underlying lung disease -S/p vanc and cefepime x 1 in ED; will hold further abx at this time, consider restarting if clinical picture warrants -Albuterol nebs PRN -Continue home scheduled budesonide nebs BID, tiotropium -Follow CBC, BMP -Continue home cardiac meds - metoprolol, diltiazem, digoxin, apixaban, ASA  Dispo: Disposition is deferred at this time, awaiting improvement of current medical problems.  Anticipated discharge in approximately 2 day(s).   The patient does have a current PCP (Zada Finders, MD) and does need an [facility] hospital follow-up appointment after discharge.  The patient does have transportation limitations that hinder transportation to clinic appointments.  LOS: 1 day   Norval Gable, MD 09/28/2015, 8:48 AM

## 2015-09-29 ENCOUNTER — Telehealth: Payer: Self-pay | Admitting: Internal Medicine

## 2015-09-29 ENCOUNTER — Inpatient Hospital Stay (HOSPITAL_COMMUNITY): Payer: Commercial Managed Care - HMO

## 2015-09-29 ENCOUNTER — Other Ambulatory Visit: Payer: Self-pay | Admitting: Pulmonary Disease

## 2015-09-29 DIAGNOSIS — J90 Pleural effusion, not elsewhere classified: Secondary | ICD-10-CM

## 2015-09-29 DIAGNOSIS — R0600 Dyspnea, unspecified: Secondary | ICD-10-CM

## 2015-09-29 DIAGNOSIS — J61 Pneumoconiosis due to asbestos and other mineral fibers: Principal | ICD-10-CM

## 2015-09-29 DIAGNOSIS — J948 Other specified pleural conditions: Secondary | ICD-10-CM | POA: Diagnosis not present

## 2015-09-29 LAB — BODY FLUID CELL COUNT WITH DIFFERENTIAL
Eos, Fluid: 0 %
Lymphs, Fluid: 89 %
Monocyte-Macrophage-Serous Fluid: 9 % — ABNORMAL LOW (ref 50–90)
NEUTROPHIL FLUID: 2 % (ref 0–25)
WBC FLUID: 4024 uL — AB (ref 0–1000)

## 2015-09-29 LAB — BASIC METABOLIC PANEL
Anion gap: 10 (ref 5–15)
BUN: 18 mg/dL (ref 6–20)
CHLORIDE: 100 mmol/L — AB (ref 101–111)
CO2: 33 mmol/L — ABNORMAL HIGH (ref 22–32)
CREATININE: 1.58 mg/dL — AB (ref 0.61–1.24)
Calcium: 8.9 mg/dL (ref 8.9–10.3)
GFR calc Af Amer: 49 mL/min — ABNORMAL LOW (ref >60)
GFR, EST NON AFRICAN AMERICAN: 42 mL/min — AB (ref >60)
GLUCOSE: 116 mg/dL — AB (ref 65–99)
POTASSIUM: 4.4 mmol/L (ref 3.5–5.1)
SODIUM: 143 mmol/L (ref 135–145)

## 2015-09-29 LAB — PROTEIN, BODY FLUID: Total protein, fluid: <3 g/dL

## 2015-09-29 LAB — PROCALCITONIN

## 2015-09-29 LAB — PROTEIN, TOTAL: TOTAL PROTEIN: 6.5 g/dL (ref 6.5–8.1)

## 2015-09-29 LAB — LACTATE DEHYDROGENASE, PLEURAL OR PERITONEAL FLUID: LD FL: 83 U/L — AB (ref 3–23)

## 2015-09-29 LAB — LACTATE DEHYDROGENASE: LDH: 207 U/L — AB (ref 98–192)

## 2015-09-29 MED ORDER — FUROSEMIDE 40 MG PO TABS
40.0000 mg | ORAL_TABLET | Freq: Every day | ORAL | Status: DC
  Administered 2015-09-29 – 2015-10-03 (×5): 40 mg via ORAL
  Filled 2015-09-29 (×5): qty 1

## 2015-09-29 MED ORDER — OXYCODONE-ACETAMINOPHEN 5-325 MG PO TABS
1.0000 | ORAL_TABLET | Freq: Three times a day (TID) | ORAL | Status: DC | PRN
  Administered 2015-09-29 – 2015-09-30 (×3): 1 via ORAL
  Filled 2015-09-29 (×3): qty 1

## 2015-09-29 MED ORDER — FUROSEMIDE 10 MG/ML IJ SOLN
40.0000 mg | Freq: Once | INTRAMUSCULAR | Status: AC
  Administered 2015-09-29: 40 mg via INTRAVENOUS
  Filled 2015-09-29: qty 4

## 2015-09-29 NOTE — Telephone Encounter (Signed)
Spoke with patient and he has been in and out of the hospital.  Was in the hospital at the time he was to see Dr Roxy Manns to discuss MAZE procedure per Dr Rayann Heman.  I have called Dr Ricard Dillon office and spoke with Ebony Hail from the office.  She is going to contact Dr Roxy Manns to see if he will consult on patient since he is inpatient now.

## 2015-09-29 NOTE — Clinical Social Work Note (Signed)
Clinical Social Work Assessment  Patient Details  Name: [name] MRN: [id number] Date of Birth: 01/25/1946  Date of referral:  09/29/15               Reason for consult:  Discharge Planning                Permission sought to share information with:  Family Supports, Facility Contact Representative, Case Manager Permission granted to share information::  Yes, Verbal Permission Granted  Name::      (Sonya Long )  Agency::   ([facility] )  Relationship::   (Daughter)  Contact Information:   ([phone])  Housing/Transportation Living arrangements for the past 2 months:  Skilled Nursing Facility Source of Information:  Patient Patient Interpreter Needed:  None Criminal Activity/Legal Involvement Pertinent to Current Situation/Hospitalization:  No - Comment as needed Significant Relationships:  Adult Children Lives with:  Self Do you feel safe going back to the place where you live?  Yes Need for family participation in patient care:  No (Coment)  Care giving concerns:  Patient admitted from SNF, [facility] and has reported that he will NOT return at d/c.    Social Worker assessment / plan:  Clinical Social Worker met with patient in reference to post-acute placement/ patient's return to SNF. CSW introduced CSW role. Patient stated that he has been at [facility] as well as most recently [facility] in the past. Patient at [facility] since the end of March. Patient stated that he does not plan to return to [facility] and prefers to return home at d/c. Patient stated he prefers Home Health at d/c. Patient asked several questions in regards to Medicaid application process reporting that he has completed an application with [facility] in the past however he dated the application incorrectly. Patient stated he was told the application was invalid due to date and that he would need to appeal and submit new application.  CSW to provide  Medicaid application at bedside. No further concerns reported by patient at this time. CSW remains available as needed.   Employment status:  Retired, Disabled (Comment on whether or not currently receiving Disability) Insurance information:  Managed Care PT Recommendations:  Not assessed at this time Information / Referral to community resources:  Skilled Nursing Facility  Patient/Family's Response to care:  Pt alert/oriented x4. Pt declining to return to [facility] and stated he will return home at d/c. Pt pleasant and appreciated social work intervention.   Patient/Family's Understanding of and Emotional Response to Diagnosis, Current Treatment, and Prognosis:  Pt knowledgeable of Medicaid application process and on-going medical interventions.   Emotional Assessment Appearance:  Appears stated age Attitude/Demeanor/Rapport:   (Pleasant ) Affect (typically observed):  Accepting, Appropriate, Calm Orientation:  Oriented to Situation, Oriented to  Time, Oriented to Place, Oriented to Self Alcohol / Substance use:  Not Applicable Psych involvement (Current and /or in the community):  No (Comment)  Discharge Needs  Concerns to be addressed:  Denies Needs/Concerns at this time Readmission within the last 30 days:  Yes Current discharge risk:  Chronically ill Barriers to Discharge:  Continued Medical Work up   , MSW, LCSWA [phone] 09/29/2015 4:30 PM  

## 2015-09-29 NOTE — Progress Notes (Signed)
Consent obtained for thoracentesis. Wendee Copp

## 2015-09-29 NOTE — Progress Notes (Signed)
Subjective: [name] had no acute events overnight. He feels much better today than yesterday, and denies worsening shortness of breath or cough. He also denies CP, palpitations, N/V/D, or other symptoms at this time.  Objective: Vital signs in last 24 hours: Filed Vitals:   09/29/15 0635 09/29/15 0940 09/29/15 1041 09/29/15 1055  BP: 108/74 121/78 108/90   Pulse: 69 72 110   Temp: 98.4 F (36.9 C)  98.4 F (36.9 C)   TempSrc: Oral  Oral   Resp: 18  20   Height:      Weight:      SpO2: 100% 100% 100% 100%   Weight change:   Intake/Output Summary (Last 24 hours) at 09/29/15 1129 Last data filed at 09/28/15 2230  Gross per 24 hour  Intake    960 ml  Output   2300 ml  Net  -1340 ml     Gen: Chronically ill-appearing, alert and oriented to person, place, and time Neck: No cervical LAD, no thyromegaly or nodules, no JVD noted. CV: Normal rate, irregularly irregular rhythm, no murmurs, rubs, or gallops Pulmonary: Normal effort, dullness to percussion up to the mid right lower lobe. Decreased breath sounds at the bases R>L. Egophany noted on the RLL. No crackles or wheezes noted. Abdominal: Soft, non-tender, non-distended, without rebound, guarding, or masses Extremities: Distal pulses 2+ in upper and lower extremities bilaterally, no tenderness, erythema or edema  Lab Results: Basic Metabolic Panel:  Recent Labs Lab 09/28/15 0516 09/29/15 0600  NA 143 143  K 4.5 4.4  CL 103 100*  CO2 26 33*  GLUCOSE 82 116*  BUN 19 18  CREATININE 1.42* 1.58*  CALCIUM 9.2 8.9   CBC:  Recent Labs Lab 09/27/15 1448 09/27/15 2139 09/28/15 0516  WBC 11.8*  --  10.5  NEUTROABS  --  7.0  --   HGB 12.7*  --  12.5*  HCT 40.5  --  39.5  MCV 92.7  --  91.4  PLT 193  --  183  Assessment/Plan: 1. Dyspnea - patient with recurrent admissions for similar complaints over the past few months. Given the patient's HFrEF (EF 30% in 3/17), persistent AFib, that the patient is ~17lbs up, with  JVD and bilateral crackles on exam, and did not take his furosemide as instructed on last discharge, so at least some of his presentation is likely due to cardiogenic volume overload. EKG unchanged and troponins negative here as well. He has also had a presumed diagnosis of COPD with last PFTs in 2012 showing FEV/FVC 62, moderate airflow limitation without significant bronchodilator response, normal lung volumes, decreased DLCO. Arrangements for outpatient workup have been difficulty given he has frequently been between either a SNF or hospital. Patient with recurrent admissions for shortness of breath, most often labeled "multifocal pneumonia", CXR here shows similar findings to previous films, and CT chest on 09/27/15 revealed findings suggestive of emphysematous changes with asbestos-related pleural disease, with extensive pleural calcification and R>L pleural effusion with associated atelectasis, which may explain his normal lung volumes 2/2 scarring. His presentation is most likely a mixed picture of a CHF exacerbation with worsening underlying asbestosis that is currently not well-characterized. Given minimal improvement with several antibiotics, no WBC, and a negative procalcitonin, I believe his current symptoms are less likely due to pneumonia.  -Continue IV furosemide with strict I&Os -Thoracentesis today; will f/u pleural fluid analysis -Pulmonology following; greatly appreciate the thoughtful care of this mutual patient -S/p vanc and cefepime x 1 in  ED; will hold further abx at this time given negative PCT, improvement of sx with nebs and lasix alone -Albuterol nebs PRN -Continue home scheduled budesonide nebs BID, tiotropium -Follow CBC, BMP -Continue home cardiac meds - metoprolol, diltiazem, digoxin, apixaban, ASA  Dispo: Disposition is deferred at this time, awaiting improvement of current medical problems.  Anticipated discharge in approximately 2 day(s).   The patient does have a current  PCP (Zada Finders, MD) and does need an [facility] hospital follow-up appointment after discharge.  The patient does have transportation limitations that hinder transportation to clinic appointments.  LOS: 2 days   Norval Gable, MD 09/29/2015, 11:29 AM

## 2015-09-29 NOTE — Progress Notes (Signed)
Patient requested to speak to his MD, MD aware. Dr. Randell Patient will have one of her associate either calling patient's room or will come up to see pt. Patient notified.   Ave Filter, RN

## 2015-09-29 NOTE — Telephone Encounter (Signed)
New Message  Pt requestd to speak w/ RN- would not specify nature of phone call. Please call back and discuss.

## 2015-09-29 NOTE — Procedures (Signed)
Thoracentesis Procedure Note  Pre-operative Diagnosis: Right Pleural Effusion  Post-operative Diagnosis: same  Indications: Diagnostic evaluation of pleural fluid and therapeutic relief of dyspnea  Procedure Details  Consent: Informed consent was obtained. Risks of the procedure were discussed including: infection, bleeding, pain, pneumothorax.  Under sterile conditions the patient was positioned. Betadine solution and sterile drapes were utilized.  1% plain lidocaine was used to anesthetize the 8th rib space. Fluid was obtained without any difficulties and minimal blood loss.  A dressing was applied to the wound and wound care instructions were provided.   Findings 400 ml of cloudy amber pleural fluid was obtained. A sample was sent to cytology, cell counts, as well as for infection analysis.  Complications:  None; patient tolerated the procedure well.          Condition: stable  Plan A follow up chest x-ray was ordered.  Procedure performed under direct supervision of Dr. Vaughan Browner and with ultrasound guidance.     Noe Gens, NP-C [facility] Pgr: [phone] or if no answer [phone] 09/29/2015, 1:07 PM

## 2015-09-29 NOTE — Consult Note (Signed)
   [facility] Inpatient Consult   09/29/2015  [name] Dec 04, 1945 [id number]   Patient is currently active with [facility] for chronic disease management services.This is th patient's fifth admission in the past 6 months.  Patient has been engaged by a [employer] and CSW.  Patient's current discharge plan is to a skilled nursing facility.  Patient will receive a post discharge call and will be evaluated for post facility care management needs for assessments and disease process education. Please see [facility] Social Worker notes for details.   Made Inpatient Case Manager aware that [facility] following. Of note, [facility] Care Management services does not replace or interfere with any services that are needed or arranged by inpatient case management or social work.  For additional questions or referrals please contact:  Natividad Brood, RN BSN [facility] Liaison  [phone] business mobile phone Toll free office [phone]

## 2015-09-29 NOTE — Progress Notes (Signed)
Name: [name] MRN: [id number] DOB: 1946-02-29    ADMISSION DATE:  09/27/2015 CONSULTATION DATE:  09/28/15  REFERRING MD :  Dr. Beryle Beams   CHIEF COMPLAINT:  Recurrent PNA    SUBJECTIVE:  Patient upset that thoracentesis hasn't happened yet.  Upset that room assignment has to be changed.  Upset that lunch order isn't correct.    VITAL SIGNS: Temp:  [97.7 F (36.5 C)-98.7 F (37.1 C)] 98.4 F (36.9 C) (05/12 1041) Pulse Rate:  [69-110] 110 (05/12 1041) Resp:  [18-20] 20 (05/12 1041) BP: (108-124)/(74-90) 108/90 mmHg (05/12 1041) SpO2:  [93 %-100 %] 100 % (05/12 1055)  PHYSICAL EXAMINATION: General:  Chronically ill appearing adult male in NAD Neuro:  AAOx4, speech clear, MAE  HEENT:  MM pink/moist, no JVD Cardiovascular:  s1s2 rrr, no m/r/g Lungs:  Even/non-labored, lungs bilaterally clear, diminished LLL Abdomen:  Obese/soft, bsx4 active  Musculoskeletal:  No acute deformities  Skin:  Warm/dry, no edema, BLE changes c/w chronic venous stasis    Recent Labs Lab 09/27/15 1448 09/28/15 0516 09/29/15 0600  NA 143 143 143  K 4.8 4.5 4.4  CL 108 103 100*  CO2 28 26 33*  BUN 19 19 18   CREATININE 1.44* 1.42* 1.58*  GLUCOSE 133* 82 116*    Recent Labs Lab 09/27/15 1448 09/28/15 0516  HGB 12.7* 12.5*  HCT 40.5 39.5  WBC 11.8* 10.5  PLT 193 183   Dg Chest 2 View  09/27/2015  CLINICAL DATA:  Shortness of Breath EXAM: CHEST  2 VIEW COMPARISON:  09/21/2015 FINDINGS: Cardiomediastinal silhouette is stable. Dual lead cardiac pacemaker is unchanged in position. There is interval small to moderate right pleural effusion with right basilar atelectasis or infiltrate. Stable left pleural effusion with left basilar atelectasis. Stable patchy airspace disease left perihilar and left upper lobe. Persistent patchy airspace is right perihilar probable multifocal pneumonia. IMPRESSION: There is interval small to moderate right pleural effusion with right basilar atelectasis or  infiltrate. Stable left pleural effusion with left basilar atelectasis. Stable patchy airspace disease left perihilar and left upper lobe. Persistent patchy airspace is right perihilar probable multifocal pneumonia. Electronically Signed   By: Lahoma Crocker M.D.   On: 09/27/2015 15:07   Ct Chest W Contrast  09/27/2015  CLINICAL DATA:  Dyspnea. EXAM: CT CHEST WITH CONTRAST TECHNIQUE: Multidetector CT imaging of the chest was performed during intravenous contrast administration. CONTRAST:  1 ISOVUE-300 IOPAMIDOL (ISOVUE-300) INJECTION 61% COMPARISON:  06/02/2013 chest CT. Chest radiograph from earlier today. FINDINGS: Mediastinum/Nodes: Stable mild cardiomegaly. Two lead right subclavian pacemaker is noted with lead tips in the right ventricular apex. No pericardial fluid/thickening. Left anterior descending coronary atherosclerosis. 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 Dilated main pulmonary artery (3.6 cm diameter), increased from 3.3 cm. No central pulmonary emboli. Normal visualized thyroid. Normal esophagus. No axillary adenopathy. Mildly enlarged 1.1 cm subcarinal node (series 4/ image 71), new. Mild bilateral hilar adenopathy, new, largest 1.3 cm on the right (series 4/ image 59) and 1.1 cm on the left (series 4/image 60). Lungs/Pleura: No pneumothorax. There are extensive pleural plaques throughout the left greater than right pleural spaces, unchanged. Small right greater than left pleural effusions are increased bilaterally. There is a pleural-based 1.8 x 1.6 cm nodular focus of consolidation in the posterior left lower lobe (series 5/ image 95) with surrounding parenchymal bands, mildly increased from 1.7 x 1.2 cm on 06/02/2013. There is a separate thick transpulmonic bandlike focus of consolidation at the periphery of the  left upper and left lower lobes with associated volume loss and distortion, significantly increased from 06/02/2013 (series 5/image 60). There is near complete consolidation  and significant volume loss in the right lower lobe, significantly worsened since 06/02/2013. There is mild centrilobular emphysema and diffuse bronchial wall thickening. There is interlobular septal thickening in both lungs, predominantly in the upper lobes. Upper abdomen: Unremarkable. Musculoskeletal: No aggressive appearing focal osseous lesions. Mild degenerative changes in the thoracic spine. Symmetric mild gynecomastia. IMPRESSION: 1. Findings of asbestos related pleural disease, including extensive calcified pleural plaques in the left greater than right pleural spaces and small bilateral pleural effusions, right greater than left, increased from 2015. 2. Near complete right lower lobe consolidation, volume loss and distortion. Thick transpulmonic bandlike opacity in the periphery of the left lung and nodular pleural based focus of consolidation in the basilar left lower lobe, with associated distortion and volume loss. These findings have significantly worsened since 2015 and are favored to represent the progressive sequela of asbestos related pleural disease with rounded atelectasis and extensive lung scarring. An underlying component of pneumonia or tumor is difficult to exclude. Management options include short-term chest CT follow-up and/or PET-CT. 3. New mild subcarinal and bilateral hilar adenopathy, nonspecific. 4. Mild cardiomegaly. Increased dilatation of the main pulmonary artery, suggesting worsening pulmonary hypertension. Mild interlobular septal thickening in the upper lobes suggests a component of mild pulmonary edema. Electronically Signed   By: Ilona Sorrel M.D.   On: 09/27/2015 19:34    SIGNIFICANT EVENTS  5/10  Admit with SOB  STUDIES:  08/14/15 ECHO >> LV moderately dilated, EF 30%, diffuse hypokinesis, mild AR, mod MR, mod TR 5/10  CT Chest >> asbestos-related pleural disease including extensive calcified pleural plaques in left greater than right pleural spaces, small bilateral  pleural effusions, near complete right lower lobe consolidation, volume loss, thick transplant pulmonic bandlike opacity in the periphery of the left lung and nodular pleural-based focus of consolidation in the left lower lobe concerning for progressive sequela I of asbestos-related pleural disease, new mild subcarinal and bilateral hilar adenopathy, mild cardiomegaly.    ASSESSMENT / PLAN:  1. Dyspnea - primary complaint, likely multifactorial in the setting of COPD, CHF, deconditioning, pleural effusions and abnormal CT appearance LLL.  DDx includes fibrothorax, infectious / recurrent PNA, and malignancy.     2. Questionable Asbestos Related Pleural Plaques - noted pleural plaques on R.  Progression from 2016 CT imaging (unable to see images, per RADs).  Nodular disease noted in RLL  3. Bilateral R>L Pleural Effusions   4. R/O PNA   5. O2 Dependent COPD   6. CHF   Plan: Continue O2 at 2L - baseline Wean O2 for sats 88-94% Thoracentesis 5/12 for fluid analysis / cytology  If above negative, may need FOB given recurrent nature of symptoms Pulmonary hygiene - IS, mobilize Intermittent CXR  Continue empiric abx for now Trend PCT Lasix 40 mg BID  Noe Gens, NP-C [facility] Pgr: [phone] or if no answer [phone] 09/29/2015, 12:29 PM

## 2015-09-29 NOTE — Progress Notes (Addendum)
Patient transferred to 5M11 from [phone]. MD and staffs in room at this time for procedure. Daughter notified of transfer.   Ave Filter, RN

## 2015-09-29 NOTE — Progress Notes (Signed)
Pharmacist Heart Failure Core Measure Documentation  Assessment: [name] has an EF documented as 30% on 08/14/15 by ECHO.  Rationale: Heart failure patients with left ventricular systolic dysfunction (LVSD) and an EF < 40% should be prescribed an angiotensin converting enzyme inhibitor (ACEI) or angiotensin receptor blocker (ARB) at discharge unless a contraindication is documented in the medical record.  This patient is not currently on an ACEI or ARB for HF.  This note is being placed in the record in order to provide documentation that a contraindication to the use of these agents is present for this encounter.  ACE Inhibitor or Angiotensin Receptor Blocker is contraindicated (specify all that apply)  []   ACEI allergy AND ARB allergy []   Angioedema []   Moderate or severe aortic stenosis []   Hyperkalemia []   Hypotension []   Renal artery stenosis [x]   Worsening renal function, preexisting renal disease or dysfunction   [name] 09/29/2015 11:40 AM

## 2015-09-29 NOTE — NC FL2 (Signed)
[place] LEVEL OF CARE SCREENING TOOL     IDENTIFICATION  Patient Name: [name] Birthdate: Sep 18, 1945 Sex: male Admission Date (Current Location): 09/27/2015  [facility] and [place] Number:  Herbalist and Address:  [facility], [place] [address] [place], [place] [address]      Provider Number: [id number]  Attending Physician Name and Address:  Annia Belt, MD  Relative Name and Phone Number:       Current Level of Care: Hospital Recommended Level of Care: [place] Prior Approval Number:    Date Approved/Denied:   PASRR Number:  ([id number])  Discharge Plan: SNF    Current Diagnoses: Patient Active Problem List   Diagnosis Date Noted  . Pleural effusion   . Asbestosis ([place])   . History of DVT of lower extremity   . History of femoral artery thrombosis   . CHF (congestive heart failure), NYHA class IV ([place])   . Pneumonia 09/27/2015  . Acute CHF (congestive heart failure) ([place]) 09/27/2015  . COPD with hypoxia ([place])   . Chronic anticoagulation   . Myoclonic jerking   . Severe sepsis ([place]) 09/19/2015  . Altered mental state 09/19/2015  . Permanent atrial fibrillation ([place])   . CKD (chronic kidney disease)   . Acute respiratory failure with hypercapnia ([place])   . HCAP (healthcare-associated pneumonia) 08/24/2015  . Dyspnea 08/24/2015  . Coagulopathy ([place])   . Arterial embolus of lower extremity ([place])   . Chronic obstructive pulmonary disease ([place])   . Persistent atrial fibrillation ([place])   . 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 COPD exacerbation ([place])   . Acute respiratory failure ([place])   . Central line clotted   . Encounter for central line placement   . Malnutrition of moderate degree 08/13/2015  . Pressure ulcer 08/13/2015  . Ileus ([place])   . Hyperkalemia   . Intestinal obstruction ([place])   . Normal coronary arteries-2003 07/31/2015  . Demand ischemia ([place]) 07/31/2015  .  Atrial fibrillation with RVR ([place]) 07/29/2015  . Acute systolic congestive heart failure ([place])   . Long term current use of opiate analgesic 06/27/2015  . Acute-on-chronic kidney injury ([place]) 06/25/2015  . Decreased appetite 06/19/2015  . Neuropathic pain of both feet ([place]) 05/03/2015  . Heel pain 03/02/2015  . Chronic pain 12/13/2014  . Sepsis secondary to UTI ([place]) 11/12/2014  . Protein-calorie malnutrition, severe ([place]) 10/26/2014  . Longstanding persistent atrial fibrillation ([place]) 10/11/2014  . STEMI (ST elevation myocardial infarction) ([place]) 10/11/2014  . TIA (transient ischemic attack) 10/11/2014  . Unspecified constipation 01/25/2014  . Healthcare maintenance 01/25/2014  . BPH (benign prostatic hyperplasia) 01/20/2014  . PVD (peripheral vascular disease) ([place]) 12/10/2013  . Decreased hearing 09/08/2013  . Rash and nonspecific skin eruption 08/13/2013  . Non-ischemic cardiomyopathy ([place]) 05/17/2013  . Weight loss 03/19/2012  . Insomnia 11/19/2010  . Chronic combined systolic and diastolic congestive heart failure ([place]) 06/20/2010  . Long-term (current) use of anticoagulants 06/05/2010  . H/O Arterial embolism of leg  06/05/2010  . Depression 01/19/2010  . Chronic kidney disease (CKD), stage III (moderate) 11/18/2009  . History of hyperthyroidism 03/13/2009  . Bilateral hip pain 03/23/2007  . INTERMITTENT CLAUDICATION 10/24/2006  . OSTEOARTHRITIS 09/05/2006  . G E R D 09/04/2006  . HYDROCELE NOS 09/04/2006  . COPD (chronic obstructive pulmonary disease) ([place]) 08/28/2006  . BACK PAIN 06/02/2006  . ERECTILE DYSFUNCTION 03/01/2006  . History of tobacco abuse 03/01/2006  .  Essential hypertension 03/01/2006  . Cardiac pacemaker in situ 03/01/2006  . SPONDYLOSIS 03/01/2006    Orientation RESPIRATION BLADDER Height & Weight     Self, Time, Situation, Place  O2 (at 3L) Continent Weight: 174 lb 12.8 oz (79.289 kg) Height:  6\' 3"  (190.5 cm)  BEHAVIORAL SYMPTOMS/MOOD  NEUROLOGICAL BOWEL NUTRITION STATUS   (NONE )  (NONE ) Continent Diet (Regular )  AMBULATORY STATUS COMMUNICATION OF NEEDS Skin   Limited Assist Verbally  (FOOT CALLUS )                       Personal Care Assistance Level of Assistance  Bathing, Feeding, Dressing Bathing Assistance: Limited assistance Feeding assistance: Independent Dressing Assistance: Limited assistance     Functional Limitations Info  Sight, Hearing, Speech Sight Info: Adequate   Speech Info: Adequate    SPECIAL CARE FACTORS FREQUENCY  PT (By licensed PT), OT (By licensed OT)     PT Frequency: 5 OT Frequency: 2            Contractures Contractures Info: Not present    Additional Factors Info  Code Status, Allergies Code Status Info: FULL CODE  Allergies Info: Amiodarone, Penicillins  Psychotropic Info:  (Remeron, Maxipime)         Current Medications (09/29/2015):  This is the current hospital active medication list Current Facility-Administered Medications  Medication Dose Route Frequency Provider Last Rate Last Dose  . 0.9 %  sodium chloride infusion  250 mL Intravenous PRN Milagros Loll, MD      . acetaminophen (TYLENOL) tablet 650 mg  650 mg Oral Q4H PRN Milagros Loll, MD   650 mg at 09/28/15 1206  . albuterol (PROVENTIL) (2.5 MG/3ML) 0.083% nebulizer solution 3 mL  3 mL Inhalation Q6H PRN Milagros Loll, MD   3 mL at 09/29/15 1054  . apixaban (ELIQUIS) tablet 2.5 mg  2.5 mg Oral BID Milagros Loll, MD   2.5 mg at 09/29/15 0940  . aspirin chewable tablet 81 mg  81 mg Oral Daily Milagros Loll, MD   81 mg at 09/29/15 0939  . bisacodyl (DULCOLAX) suppository 10 mg  10 mg Rectal Daily PRN Milagros Loll, MD      . budesonide (PULMICORT) nebulizer solution 0.25 mg  0.25 mg Nebulization BID Milagros Loll, MD   0.25 mg at 09/29/15 0746  . buPROPion (WELLBUTRIN) tablet 75 mg  75 mg Oral BID Milagros Loll, MD   75 mg at 09/29/15 0839  . cholecalciferol (VITAMIN D) tablet  1,000 Units  1,000 Units Oral Daily Milagros Loll, MD   1,000 Units at 09/29/15 0940  . digoxin (LANOXIN) tablet 0.0625 mg  0.0625 mg Oral Daily Milagros Loll, MD   0.0625 mg at 09/29/15 0939  . diltiazem (CARDIZEM CD) 24 hr capsule 120 mg  120 mg Oral Daily Milagros Loll, MD   120 mg at 09/29/15 0940  . metoprolol succinate (TOPROL-XL) 24 hr tablet 100 mg  100 mg Oral Daily Milagros Loll, MD   100 mg at 09/29/15 0940  . mirtazapine (REMERON) tablet 15 mg  15 mg Oral QHS Milagros Loll, MD   15 mg at 09/28/15 2154  . multivitamin with minerals tablet 1 tablet  1 tablet Oral Daily Milagros Loll, MD   1 tablet at 09/29/15 0940  . ondansetron (ZOFRAN) injection 4 mg  4 mg Intravenous Q6H PRN Milagros Loll, MD      .  oxyCODONE-acetaminophen (PERCOCET/ROXICET) 5-325 MG per tablet 1 tablet  1 tablet Oral Q8H PRN Norval Gable, MD   1 tablet at 09/29/15 0940  . pantoprazole (PROTONIX) EC tablet 40 mg  40 mg Oral Daily Milagros Loll, MD   40 mg at 09/29/15 0940  . rosuvastatin (CRESTOR) tablet 20 mg  20 mg Oral q1800 Milagros Loll, MD   20 mg at 09/28/15 1758  . senna-docusate (Senokot-S) tablet 1 tablet  1 tablet Oral Daily PRN Milagros Loll, MD      . sodium chloride flush (NS) 0.9 % injection 3 mL  3 mL Intravenous Q12H Milagros Loll, MD   3 mL at 09/28/15 2200  . sodium chloride flush (NS) 0.9 % injection 3 mL  3 mL Intravenous PRN Milagros Loll, MD      . tiotropium [facility]) inhalation capsule 18 mcg  18 mcg Inhalation Daily Milagros Loll, MD   18 mcg at 09/29/15 0747     Discharge Medications: Please see discharge summary for a list of discharge medications.  Relevant Imaging Results:  Relevant Lab Results:   Additional Information [id number]  Glendon Axe, MSW, LCSWA [phone] 09/29/2015 12:10 PM

## 2015-09-30 ENCOUNTER — Inpatient Hospital Stay (HOSPITAL_COMMUNITY): Payer: Commercial Managed Care - HMO

## 2015-09-30 DIAGNOSIS — N433 Hydrocele, unspecified: Secondary | ICD-10-CM | POA: Insufficient documentation

## 2015-09-30 DIAGNOSIS — J90 Pleural effusion, not elsewhere classified: Secondary | ICD-10-CM | POA: Insufficient documentation

## 2015-09-30 LAB — BASIC METABOLIC PANEL
Anion gap: 12 (ref 5–15)
BUN: 19 mg/dL (ref 6–20)
CALCIUM: 9 mg/dL (ref 8.9–10.3)
CO2: 32 mmol/L (ref 22–32)
CREATININE: 1.53 mg/dL — AB (ref 0.61–1.24)
Chloride: 101 mmol/L (ref 101–111)
GFR calc non Af Amer: 44 mL/min — ABNORMAL LOW (ref >60)
GFR, EST AFRICAN AMERICAN: 51 mL/min — AB (ref >60)
Glucose, Bld: 147 mg/dL — ABNORMAL HIGH (ref 65–99)
Potassium: 4.5 mmol/L (ref 3.5–5.1)
SODIUM: 145 mmol/L (ref 135–145)

## 2015-09-30 MED ORDER — OXYCODONE-ACETAMINOPHEN 5-325 MG PO TABS
1.0000 | ORAL_TABLET | Freq: Four times a day (QID) | ORAL | Status: DC | PRN
  Administered 2015-09-30 – 2015-10-03 (×5): 1 via ORAL
  Filled 2015-09-30 (×5): qty 1

## 2015-09-30 NOTE — Progress Notes (Signed)
Subjective: [name] had no acute events overnight. He continues to have improvement in his respiratory symptoms, and denies worsening shortness of breath or cough. He also denies CP, palpitations, N/V/D. He did discuss with in private about some intermittent scrotal swelling that can sometimes be painful to touch, on and off for 'at least' 6 months. He denies testicular pain, urinary symptoms, genital sores, or penile discharge. He has also noted erectile dysfunction for at least 1 year.   Objective: Vital signs in last 24 hours: Filed Vitals:   09/29/15 2201 09/30/15 0113 09/30/15 0528 09/30/15 1001  BP:  112/75 113/90 123/84  Pulse:  61 78 60  Temp:  98.5 F (36.9 C) 98.5 F (36.9 C) 98 F (36.7 C)  TempSrc:  Oral Oral Oral  Resp:  18 18 18   Height:      Weight:      SpO2: 99% 100% 100% 100%   Weight change:   Intake/Output Summary (Last 24 hours) at 09/30/15 1105 Last data filed at 09/30/15 1004  Gross per 24 hour  Intake    680 ml  Output   1500 ml  Net   -820 ml     Gen: Chronically ill-appearing, alert and oriented to person, place, and time Neck: No cervical LAD, no thyromegaly or nodules, no JVD noted. CV: Normal rate, irregularly irregular rhythm, no murmurs, rubs, or gallops Pulmonary: Normal effort, dullness to percussion up to the lower 1/3 RLL. Decreased breath sounds at the bases R>L. Egophany noted only at the very base of RLL. No crackles or wheezes noted. Abdominal: Soft, non-tender, non-distended, without rebound, guarding, or masses Extremities: Distal pulses 2+ in upper and lower extremities bilaterally, no tenderness, erythema or edema GU: Normal appearing penis without lesions or expressable discharge. Scrotum is mildly swollen, with overlying skin grossly normal although somewhat tender on palpation posteriorly. Testicles are normal in size, without masses, and are nontender. No palpable inguinal lymphadenopathy.   Lab Results: Basic Metabolic  Panel:  Recent Labs Lab 09/29/15 0600 09/30/15 0631  NA 143 145  K 4.4 4.5  CL 100* 101  CO2 33* 32  GLUCOSE 116* 147*  BUN 18 19  CREATININE 1.58* 1.53*  CALCIUM 8.9 9.0   CBC:  Recent Labs Lab 09/27/15 1448 09/27/15 2139 09/28/15 0516  WBC 11.8*  --  10.5  NEUTROABS  --  7.0  --   HGB 12.7*  --  12.5*  HCT 40.5  --  39.5  MCV 92.7  --  91.4  PLT 193  --  183  Assessment/Plan: 1. Dyspnea - patient with recurrent admissions for similar complaints over the past few months. Given the patient's HFrEF (EF 30% in 3/17), persistent AFib, that the patient is ~17lbs up, with JVD and bilateral crackles on exam, and did not take his furosemide as instructed on last discharge, so at least some of his presentation is likely due to cardiogenic volume overload. EKG unchanged and troponins negative here as well. He has also had a presumed diagnosis of COPD with last PFTs in 2012 showing FEV/FVC 62, moderate airflow limitation without significant bronchodilator response, normal lung volumes, decreased DLCO. Arrangements for outpatient workup have been difficulty given he has frequently been between either a SNF or hospital. Patient with recurrent admissions for shortness of breath, most often labeled "multifocal pneumonia", CXR here shows similar findings to previous films, and CT chest on 09/27/15 revealed findings suggestive of emphysematous changes with asbestos-related pleural disease, with extensive pleural calcification and  R>L pleural effusion with associated atelectasis, which may explain his normal lung volumes 2/2 scarring. His presentation is most likely a mixed picture of a CHF exacerbation with worsening underlying asbestosis that is currently not well-characterized. Given minimal improvement with several antibiotics, no WBC, and a negative procalcitonin, I believe his current symptoms are less likely due to pneumonia.  -Continue diuresis with furosemide with strict I&Os -Thoracentesis on  5/12; will f/u pleural fluid analysis and cytology -Pulmonology following; greatly appreciate the thoughtful care of this mutual patient -S/p vanc and cefepime x 1 in ED; will hold further abx at this time given negative PCT, improvement of sx with nebs and lasix alone -Albuterol nebs PRN -Continue home scheduled budesonide nebs BID, tiotropium -Follow CBC, BMP -Continue home cardiac meds - metoprolol, diltiazem, digoxin, apixaban, ASA  2. Scrotal swelling - intermittent for several months, with some tenderness in the posterior scrotum without overlying skin changes. Without s/s STI, no testicular masses/pain, and no regional LAD. May be dependent scrotal edema due to CHF vs varicocele/hydrocele, or simply skin irritation with reactive swelling. -Will obtain scrotal US  3. Erectile dysfunction - for ~1 yr. Likely organic, due to extensive cardiovascular history, as well as age. Currently not on nitrates, such as PRN nitro or Imdur, so I am comfortable prescribing him sildenafil on discharge. -Sildenafil PRN on discharge  Dispo: Disposition is deferred at this time, awaiting improvement of current medical problems.  Anticipated discharge in approximately 2 day(s).   The patient does have a current PCP (Travis Finders, MD) and does need an [facility] hospital follow-up appointment after discharge.  The patient does have transportation limitations that hinder transportation to clinic appointments.  LOS: 3 days   Norval Gable, MD 09/30/2015, 11:05 AM

## 2015-10-01 ENCOUNTER — Other Ambulatory Visit: Payer: Self-pay | Admitting: Internal Medicine

## 2015-10-01 LAB — BASIC METABOLIC PANEL
ANION GAP: 9 (ref 5–15)
BUN: 19 mg/dL (ref 6–20)
CHLORIDE: 99 mmol/L — AB (ref 101–111)
CO2: 33 mmol/L — AB (ref 22–32)
CREATININE: 1.37 mg/dL — AB (ref 0.61–1.24)
Calcium: 8.9 mg/dL (ref 8.9–10.3)
GFR calc Af Amer: 58 mL/min — ABNORMAL LOW (ref >60)
GFR calc non Af Amer: 50 mL/min — ABNORMAL LOW (ref >60)
Glucose, Bld: 86 mg/dL (ref 65–99)
Potassium: 5 mmol/L (ref 3.5–5.1)
SODIUM: 141 mmol/L (ref 135–145)

## 2015-10-01 NOTE — Progress Notes (Signed)
Subjective: Doing well. SOB has improved. Denies CP.  Objective: Vital signs in last 24 hours: Filed Vitals:   09/30/15 2147 09/30/15 2232 10/01/15 0109 10/01/15 0514  BP:  108/76 126/69 131/83  Pulse:  71 66 72  Temp:  98.4 F (36.9 C) 98.1 F (36.7 C) 98.2 F (36.8 C)  TempSrc:  Oral Oral Oral  Resp:  18 18 18   Height:      Weight:      SpO2: 97% 100% 98% 99%   Weight change:   Intake/Output Summary (Last 24 hours) at 10/01/15 0805 Last data filed at 10/01/15 0800  Gross per 24 hour  Intake      3 ml  Output   2350 ml  Net  -2347 ml   General Apperance: NAD HEENT: Normocephalic, atraumatic, anicteric sclera Neck: Supple, trachea midline Lungs: Clear to auscultation bilaterally. No wheezes, rhonchi or rales. Breathing comfortably Heart: Regular rate and rhythm, no murmur/rub/gallop Abdomen: Soft, nontender, nondistended, no rebound/guarding Extremities: Warm and well perfused, no edema Skin: No rashes or lesions Neurologic: Alert and interactive. No gross deficits.  Lab Results: Basic Metabolic Panel:  Recent Labs Lab 09/29/15 0600 09/30/15 0631  NA 143 145  K 4.4 4.5  CL 100* 101  CO2 33* 32  GLUCOSE 116* 147*  BUN 18 19  CREATININE 1.58* 1.53*  CALCIUM 8.9 9.0   Liver Function Tests:  Recent Labs Lab 09/27/15 1448 09/29/15 1326  AST 36  --   ALT 36  --   ALKPHOS 79  --   BILITOT 0.7  --   PROT 6.0* 6.5  ALBUMIN 3.0*  --    CBC:  Recent Labs Lab 09/27/15 1448 09/27/15 2139 09/28/15 0516  WBC 11.8*  --  10.5  NEUTROABS  --  7.0  --   HGB 12.7*  --  12.5*  HCT 40.5  --  39.5  MCV 92.7  --  91.4  PLT 193  --  183    Micro Results: Recent Results (from the past 240 hour(s))  MRSA PCR Screening     Status: None   Collection Time: 09/28/15 12:45 AM  Result Value Ref Range Status   MRSA by PCR NEGATIVE NEGATIVE Final    Comment:        The GeneXpert MRSA Assay (FDA approved for NASAL specimens only), is one component of  a comprehensive MRSA colonization surveillance program. It is not intended to diagnose MRSA infection nor to guide or monitor treatment for MRSA infections.   Body fluid culture     Status: None (Preliminary result)   Collection Time: 09/29/15  1:54 PM  Result Value Ref Range Status   Specimen Description FLUID PLEURAL  Final   Special Requests NONE  Final   Gram Stain   Final    ABUNDANT WBC PRESENT, PREDOMINANTLY MONONUCLEAR NO ORGANISMS SEEN    Culture NO GROWTH < 24 HOURS  Final   Report Status PENDING  Incomplete   Studies/Results: US Scrotum  09/30/2015  CLINICAL DATA:  Left more than right scrotal swelling for more than 1 year. EXAM: ULTRASOUND OF SCROTUM TECHNIQUE: Complete ultrasound examination of the testicles, epididymis, and other scrotal structures was performed. COMPARISON:  10/13/2014 CT FINDINGS: Right testicle Measurements: 4.8 x 2.9 x 2.9 cm. Mildly heterogeneous without focal mass. Color flow appears normal. Left testicle Measurements: 4.6 x 3.2 x 3.0 cm. Mildly heterogeneous without focal mass. Color flow appears normal. Right epididymis:  0.3 x 0.2 x 0.4 cm cyst  present. Left epididymis:  0.6 x 0.6 x 0.5 cm cyst present. Hydrocele:  Left hydrocele present. Varicocele:  None visualized. IMPRESSION: 1. No evidence for testicular mass. Symmetric color flow in the testicles bilaterally. Full Doppler evaluation not performed. 2. Left hydrocele likely accounting for the patient's symptoms of scrotal swelling. 3. Small bilateral epididymal cyst/ spermatocele. Electronically Signed   By: Nolon Nations M.D.   On: 09/30/2015 18:33   Dg Chest Port 1 View  09/29/2015  CLINICAL DATA:  Status post right thoracentesis EXAM: PORTABLE CHEST 1 VIEW COMPARISON:  09/27/2015 FINDINGS: Cardiac shadow is stable. Pacing device is again seen and stable. Left pleural effusion is again seen and stable. Chronic changes in the left mid lung are again noted. There is been interval right-sided  thoracentesis with significant reduction in the right-sided pleural effusion. No pneumothorax is seen. No new focal abnormality is noted. IMPRESSION: No pneumothorax following thoracentesis on the right. Electronically Signed   By: Inez Catalina M.D.   On: 09/29/2015 14:31   Medications: I have reviewed the patient's current medications. Scheduled Meds: . apixaban  2.5 mg Oral BID  . aspirin  81 mg Oral Daily  . budesonide  0.25 mg Nebulization BID  . buPROPion  75 mg Oral BID  . cholecalciferol  1,000 Units Oral Daily  . digoxin  0.0625 mg Oral Daily  . diltiazem  120 mg Oral Daily  . furosemide  40 mg Oral Daily  . metoprolol succinate  100 mg Oral Daily  . mirtazapine  15 mg Oral QHS  . multivitamin with minerals  1 tablet Oral Daily  . pantoprazole  40 mg Oral Daily  . rosuvastatin  20 mg Oral q1800  . sodium chloride flush  3 mL Intravenous Q12H  . tiotropium  18 mcg Inhalation Daily   Continuous Infusions:  PRN Meds:.sodium chloride, acetaminophen, albuterol, bisacodyl, ondansetron (ZOFRAN) IV, oxyCODONE-acetaminophen, senna-docusate, sodium chloride flush Assessment/Plan: 70 year old man with atrial fibrillation, chronic combined CHF, CKD3, COPD presenting with chest pain and dyspnea.  Acute on chronic heart failure with reduced ejection fraction: Last echo 3/27 with LV EF 30%. 2.1 L UOP yesterday.  -Continue home ASA daily, metoprolol succinate 100mg  daily, Crestor 20mg  daily -Continue Lasix 40mg  PO daily  Abnormal chest CT: CT chest on 09/27/15 revealed findings suggestive of emphysematous changes with asbestos-related pleural disease, with extensive pleural calcification and R>L pleural effusion with associated atelectasis. S/p thoracentesis on 5/12.  -PCCM following, appreciate recommendations -f/u pleural fluid cytology and cultures  Hydrocele: Intermittent scrotal swelling for several months. US scrotum with no mass, symmetric color flow. He has a left hydrocele and small  bilateral epididymal cyst/spermatocele.  Atrial Fibrillation: Continue home metoprolol, diltizem, digoxin.  COPD: Continue pulmicort neb, albuterol prn.  Chronic pain: Continue Percocet 5/325 prn.  Dispo: Disposition is deferred at this time, awaiting improvement of current medical problems.  Anticipated discharge in approximately 1-2 day(s).   The patient does have a current PCP (Zada Finders, MD) and does need an [facility] hospital follow-up appointment after discharge.  The patient does not know have transportation limitations that hinder transportation to clinic appointments.  .Services Needed at time of discharge: Y = Yes, Blank = No PT:   OT:   RN:   Equipment:   Other:     LOS: 4 days   Milagros Loll, MD 10/01/2015, 8:05 AM

## 2015-10-01 NOTE — Progress Notes (Signed)
Patient ID: [name], male   DOB: 12/15/1945, 70 y.o.   MRN: [id number] Medicine attending: Pertinent data and clinical status reviewed with resident physician Dr. Jacques Earthly and I concur with her evaluation and management plan which we discussed together. No acute change in status. Likely home tomorrow after scheduled dialysis unless pulmonary medicine elects to pursue bronchoscopy. Cytology on pleural fluid obtained on May 12 should be available tomorrow. This may help guide decision.

## 2015-10-02 ENCOUNTER — Inpatient Hospital Stay (HOSPITAL_COMMUNITY): Payer: Commercial Managed Care - HMO

## 2015-10-02 DIAGNOSIS — N433 Hydrocele, unspecified: Secondary | ICD-10-CM | POA: Insufficient documentation

## 2015-10-02 LAB — BODY FLUID CULTURE: CULTURE: NO GROWTH

## 2015-10-02 NOTE — Evaluation (Signed)
Physical Therapy Evaluation Patient Details Name: [name] MRN: [id number] DOB: 1945/10/08 Today's Date: 10/02/2015   History of Present Illness  [name] is a 70 yo male who on 5/10 was admitted for SOB. Pt with HTN, chronic systolic heart failure EF 30% 07/2015, GERD, depression, bipolar, COPD on home 3 L o2, and CKD who was recently discharged on 5/5. Pt was at [facility] and he was having increasing shortness of breath and tightness in the chest.   Clinical Impression  Pt admitted with above diagnosis. Pt currently with functional limitations due to the deficits listed below (see PT Problem List). Pt tolerated OOB mobility well for first time. Pt will benefit from skilled PT to increase their independence and safety with mobility to allow discharge to the venue listed below.       Follow Up Recommendations Home health PT;Supervision/Assistance - 24 hour    Equipment Recommendations  None recommended by PT    Recommendations for Other Services       Precautions / Restrictions Precautions Precautions: Fall Restrictions Weight Bearing Restrictions: No      Mobility  Bed Mobility Overal bed mobility: Modified Independent             General bed mobility comments: increased time but HOB flat and minimal use of bed rail  Transfers Overall transfer level: Needs assistance Equipment used: Rolling walker (2 wheeled) Transfers: Sit to/from Stand Sit to Stand: Mod assist         General transfer comment: despite max directional v/c's to push up from bed and not pull up on walker pt with posterior lean requiring moda to maintain balance due to pt pulling up and lifting walker off ground  Ambulation/Gait Ambulation/Gait assistance: Min guard Ambulation Distance (Feet): 60 Feet Assistive device: Rolling walker (2 wheeled) Gait Pattern/deviations: Step-through pattern;Decreased stride length Gait velocity: decreaed   General Gait Details: pt with mild  SOB, SpO2 >95% on 3Lo2 via [place]  Stairs            Wheelchair Mobility    Modified Rankin (Stroke Patients Only)       Balance Overall balance assessment: Needs assistance Sitting-balance support: Feet supported Sitting balance-Leahy Scale: Good Sitting balance - Comments: able to don socks and shoes on at EOB without difficulty   Standing balance support: Bilateral upper extremity supported Standing balance-Leahy Scale: Fair Standing balance comment: can statically stand still without assist but needs RW for safe amb                             Pertinent Vitals/Pain Pain Assessment: No/denies pain    Home Living Family/patient expects to be discharged to:: Private residence Living Arrangements: Spouse/significant other Available Help at Discharge: Family;Available PRN/intermittently Type of Home: House Home Access: Stairs to enter Entrance Stairs-Rails: None Entrance Stairs-Number of Steps: 1 Home Layout: One level Home Equipment: Walker - 2 wheels;Cane - single point;Bedside commode Additional Comments: pt lives with fiance who is there 24/7. pt will sponge bath because shower is on second floor    Prior Function Level of Independence: Needs assistance   Gait / Transfers Assistance Needed: uses RW, reports he's only walked 3x at the SNF  ADL's / Homemaking Assistance Needed: pt able to don socks and shoes on        Hand Dominance   Dominant Hand: Right    Extremity/Trunk Assessment   Upper Extremity Assessment: Overall WFL for tasks  assessed           Lower Extremity Assessment: Generalized weakness      Cervical / Trunk Assessment: Normal  Communication   Communication: No difficulties  Cognition Arousal/Alertness: Awake/alert Behavior During Therapy: WFL for tasks assessed/performed Overall Cognitive Status: Within Functional Limits for tasks assessed                      General Comments      Exercises         Assessment/Plan    PT Assessment Patient needs continued PT services  PT Diagnosis Generalized weakness   PT Problem List Decreased strength;Decreased activity tolerance;Decreased balance;Decreased mobility;Decreased knowledge of use of DME;Cardiopulmonary status limiting activity  PT Treatment Interventions DME instruction;Gait training;Functional mobility training;Therapeutic activities;Balance training;Therapeutic exercise;Cognitive remediation;Neuromuscular re-education;Patient/family education   PT Goals (Current goals can be found in the Care Plan section) Acute Rehab PT Goals Patient Stated Goal: to go home and not go back to rehab at [facility] PT Goal Formulation: With patient Time For Goal Achievement: 10/09/15 Potential to Achieve Goals: Good    Frequency Min 3X/week   Barriers to discharge        Co-evaluation               End of Session Equipment Utilized During Treatment: Gait belt;Oxygen (3Lo2') Activity Tolerance: Patient limited by fatigue Patient left: in chair;with call bell/phone within reach;with chair alarm set Nurse Communication: Mobility status         Time: [id number] PT Time Calculation (min) (ACUTE ONLY): 31 min   Charges:   PT Evaluation $PT Eval Low Complexity: 1 Procedure PT Treatments $Gait Training: 8-22 mins   PT G CodesKingsley Callander 10/02/2015, 2:20 PM   Kittie Plater, PT, DPT Pager #: [phone] Office #: [phone]

## 2015-10-02 NOTE — Progress Notes (Signed)
Name: [name] MRN: [id number] DOB: 07/21/1945    ADMISSION DATE:  09/27/2015 CONSULTATION DATE:  09/28/15  REFERRING MD :  Dr. Beryle Beams   CHIEF COMPLAINT:  Recurrent PNA    SUBJECTIVE:  Wants to go home. No distress Feels better  VITAL SIGNS: Temp:  [97.9 F (36.6 C)-98.5 F (36.9 C)] 98.4 F (36.9 C) (05/15 0911) Pulse Rate:  [71-92] 75 (05/15 0911) Resp:  [16-20] 18 (05/15 0911) BP: (105-109)/(71-89) 108/81 mmHg (05/15 0911) SpO2:  [99 %-100 %] 100 % (05/15 0911) Weight:  [167 lb 6.4 oz (75.932 kg)] 167 lb 6.4 oz (75.932 kg) (05/15 0449) 2 liters   PHYSICAL EXAMINATION: General:  Chronically ill appearing adult male in NAD Neuro:  AAOx4, speech clear, MAE  HEENT:  MM pink/moist, no JVD Cardiovascular:  s1s2 rrr, no m/r/g Lungs:  Even/non-labored, lungs bilaterally clear, no accessory use  Abdomen:  Obese/soft, bsx4 active  Musculoskeletal:  No acute deformities  Skin:  Warm/dry, no edema, BLE changes c/w chronic venous stasis    Recent Labs Lab 09/29/15 0600 09/30/15 0631 10/01/15 1134  NA 143 145 141  K 4.4 4.5 5.0  CL 100* 101 99*  CO2 33* 32 33*  BUN 18 19 19   CREATININE 1.58* 1.53* 1.37*  GLUCOSE 116* 147* 86    Recent Labs Lab 09/27/15 1448 09/28/15 0516  HGB 12.7* 12.5*  HCT 40.5 39.5  WBC 11.8* 10.5  PLT 193 183   US Scrotum  09/30/2015  CLINICAL DATA:  Left more than right scrotal swelling for more than 1 year. EXAM: ULTRASOUND OF SCROTUM TECHNIQUE: Complete ultrasound examination of the testicles, epididymis, and other scrotal structures was performed. COMPARISON:  10/13/2014 CT FINDINGS: Right testicle Measurements: 4.8 x 2.9 x 2.9 cm. Mildly heterogeneous without focal mass. Color flow appears normal. Left testicle Measurements: 4.6 x 3.2 x 3.0 cm. Mildly heterogeneous without focal mass. Color flow appears normal. Right epididymis:  0.3 x 0.2 x 0.4 cm cyst present. Left epididymis:  0.6 x 0.6 x 0.5 cm cyst present. Hydrocele:  Left  hydrocele present. Varicocele:  None visualized. IMPRESSION: 1. No evidence for testicular mass. Symmetric color flow in the testicles bilaterally. Full Doppler evaluation not performed. 2. Left hydrocele likely accounting for the patient's symptoms of scrotal swelling. 3. Small bilateral epididymal cyst/ spermatocele. Electronically Signed   By: Nolon Nations M.D.   On: 09/30/2015 18:33    SIGNIFICANT EVENTS  5/10  Admit with SOB 5/12 right thora. Exudative    STUDIES:  08/14/15 ECHO >> LV moderately dilated, EF 30%, diffuse hypokinesis, mild AR, mod MR, mod TR 5/10  CT Chest >> asbestos-related pleural disease including extensive calcified pleural plaques in left greater than right pleural spaces, small bilateral pleural effusions, near complete right lower lobe consolidation, volume loss, thick transplant pulmonic bandlike opacity in the periphery of the left lung and nodular pleural-based focus of consolidation in the left lower lobe concerning for progressive sequela I of asbestos-related pleural disease, new mild subcarinal and bilateral hilar adenopathy, mild cardiomegaly.    ASSESSMENT / PLAN:  Dyspnea - primary complaint, likely multifactorial in the setting of COPD, CHF, deconditioning, pleural effusions and abnormal CT appearance LLL.  DDx includes fibrothorax, infectious / recurrent PNA, and malignancy.   ->favor etiology of dyspnea most likely decomp CHF superimposed on underlying COPD and chronic pleural scarring   Questionable Asbestos Related Pleural Plaques - noted pleural plaques on R.  Progression from 2016 CT imaging (unable to see images, per  RADs).  Nodular disease noted in RLL   Bilateral R>L Pleural Effusions  S/p right thora on 5/12. 400 ml.  Exudate by Lights criteria--> LDH 83    Plan: Continue O2 at 2L - baseline Wean O2 for sats 88-94% Pulmonary hygiene - IS, mobilize Cont lasix  F/u cytology  -->if negative we can arrange PET scan during our office f/u    F/y June 12 w/ Dr Vaughan Browner at 330 pm     Erick Colace ACNP-BC [place] Pager # [phone] OR # [phone] if no answer

## 2015-10-02 NOTE — Progress Notes (Signed)
Upon shift assessment approx 2015 hrs, advised pt that he had a discontinue order for telemetry and started to remove, Pt advised that he had felt a "flutter" like feeling earlier. Left telemetry in place overnight to monitor.

## 2015-10-02 NOTE — Progress Notes (Signed)
Subjective: [name] had no acute events overnight. He had a short bout of palpitations yesterday that resolved after a few seconds without other symptoms. He denies SOB, CP, or other symptoms at this time.   Objective: Vital signs in last 24 hours: Filed Vitals:   10/01/15 2055 10/01/15 2140 10/02/15 0236 10/02/15 0449  BP:  105/85 106/71   Pulse: 78 92 71   Temp:  98.5 F (36.9 C) 98.2 F (36.8 C)   TempSrc:  Oral Oral   Resp: 16 18 18    Height:      Weight:    167 lb 6.4 oz (75.932 kg)  SpO2: 99% 99% 100%    Weight change: -3 lb 9.6 oz (-1.633 kg)  Intake/Output Summary (Last 24 hours) at 10/02/15 0905 Last data filed at 10/02/15 0005  Gross per 24 hour  Intake      3 ml  Output    675 ml  Net   -672 ml     Gen: Chronically ill-appearing, alert and oriented to person, place, and time Neck: No cervical LAD, no thyromegaly or nodules, no JVD noted. CV: Normal rate, irregularly irregular rhythm, no murmurs, rubs, or gallops Pulmonary: Normal effort, dullness to percussion at the very rt lung base. Decreased breath sounds at the bases R>L. No egophany noted. No crackles or wheezes noted. Abdominal: Soft, non-tender, non-distended, without rebound, guarding, or masses Extremities: Distal pulses 2+ in upper and lower extremities bilaterally, no tenderness, erythema or edema GU: Normal appearing penis without lesions or expressable discharge. Scrotum is mildly swollen, with overlying skin grossly normal although somewhat tender on palpation posteriorly. Testicles are normal in size, without masses, and are nontender. No palpable inguinal lymphadenopathy.   Lab Results: Basic Metabolic Panel:  Recent Labs Lab 09/30/15 0631 10/01/15 1134  NA 145 141  K 4.5 5.0  CL 101 99*  CO2 32 33*  GLUCOSE 147* 86  BUN 19 19  CREATININE 1.53* 1.37*  CALCIUM 9.0 8.9   CBC:  Recent Labs Lab 09/27/15 1448 09/27/15 2139 09/28/15 0516  WBC 11.8*  --  10.5  NEUTROABS  --  7.0   --   HGB 12.7*  --  12.5*  HCT 40.5  --  39.5  MCV 92.7  --  91.4  PLT 193  --  183   Assessment/Plan: 1. Dyspnea - HFrEF (EF 30% in 3/17), persistent AFib, that the patient is ~17lbs up, with JVD and bilateral crackles on exam. Patient with recurrent admissions for shortness of breath, most often labeled "multifocal pneumonia", CXR here shows similar findings to previous films, and CT chest on 09/27/15 revealed findings suggestive of emphysematous changes with asbestos-related pleural disease, with extensive pleural calcification and R>L pleural effusion with associated atelectasis. Likely a mixed picture of a CHF exacerbation with worsening underlying asbestosis and effusion. -Repeat CT chest w con to e/f malignancy in RLL -Continue diuresis with furosemide with strict I&Os -Thoracentesis on 5/12; will f/u cytology. Lymphocytic predominant effusion with negative cultures thus far. -Pulmonology following; greatly appreciate the thoughtful care of this mutual patient. Will follow-up outpatient. -Albuterol nebs PRN  -Continue home scheduled budesonide nebs BID, tiotropium -Continue home cardiac meds - metoprolol, diltiazem, digoxin, apixaban, ASA  2. Hydrocele - Intermittent scrotal swelling for several months. US scrotum with no mass, symmetric color flow. He has a left hydrocele and small bilateral epididymal cyst/spermatocele -Consider Urology referral outpatient for possible aspiration  3. Erectile dysfunction - for ~1 yr. Likely organic, due to extensive  cardiovascular history, as well as age. Currently not on nitrates, such as PRN nitro or Imdur, so I am comfortable prescribing him sildenafil on discharge. -Sildenafil PRN on discharge  Dispo: Disposition is deferred at this time, awaiting improvement of current medical problems.  Anticipated discharge tomorrow.   The patient does have a current PCP (Zada Finders, MD) and does need an [facility] hospital follow-up appointment after  discharge.  The patient does have transportation limitations that hinder transportation to clinic appointments.  LOS: 5 days   Norval Gable, MD 10/02/2015, 9:05 AM

## 2015-10-02 NOTE — Care Management Note (Signed)
Case Management Note  Patient Details  Name: [name] MRN: [id number] Date of Birth: 07/20/1945  Subjective/Objective:                    Action/Plan: Plan is for SNF vs home with [facility] services. CM following for discharge needs.   Expected Discharge Date:  09/30/15               Expected Discharge Plan:     In-House Referral:     Discharge planning Services     Post Acute Care Choice:    Choice offered to:     DME Arranged:    DME Agency:     HH Arranged:    HH Agency:     Status of Service:  In process, will continue to follow  Medicare Important Message Given:    Date Medicare IM Given:    Medicare IM give by:    Date Additional Medicare IM Given:    Additional Medicare Important Message give by:     If discussed at [place] of Stay Meetings, dates discussed:    Additional Comments:  Pollie Friar, RN 10/02/2015, 1:59 PM

## 2015-10-02 NOTE — Clinical Social Work Note (Signed)
CSW faxed updated clinicals to [employer].  CSW to continue to follow patient's progress.  Jones Broom. [place], MSW, [place] 10/02/2015 6:24 PM

## 2015-10-03 DIAGNOSIS — N432 Other hydrocele: Secondary | ICD-10-CM

## 2015-10-03 DIAGNOSIS — N5089 Other specified disorders of the male genital organs: Secondary | ICD-10-CM

## 2015-10-03 DIAGNOSIS — I5023 Acute on chronic systolic (congestive) heart failure: Secondary | ICD-10-CM

## 2015-10-03 MED ORDER — APIXABAN 5 MG PO TABS: 5.0000 mg | ORAL_TABLET | Freq: Two times a day (BID) | ORAL | Status: DC

## 2015-10-03 MED ORDER — FUROSEMIDE 40 MG PO TABS: 40.0000 mg | ORAL_TABLET | Freq: Every day | ORAL | Status: DC

## 2015-10-03 NOTE — Care Management Important Message (Signed)
Important Message  Patient Details  Name: [name] MRN: [id number] Date of Birth: 11-29-1945   Medicare Important Message Given:  Yes    Rolm Baptise, RN 10/03/2015, 11:56 AM

## 2015-10-03 NOTE — Consult Note (Signed)
   [facility] Inpatient Consult   10/03/2015  ELREY CAPOZZA 1946/03/04 [id number]  Call received from Dr. Merrilyn Puma regarding the patient's disposition to return home with home health care [place].  Will update the [place] Management team of the patient's disposition and follow up needs.  For further questions, please contact:  Natividad Brood, RN BSN [facility] Liaison  [phone] business mobile phone Toll free office [phone]

## 2015-10-03 NOTE — Progress Notes (Signed)
Subjective: [name] had no acute events overnight. He denies SOB, CP, palpitations, or other symptoms at this time. He is ready to go home.  Objective: Vital signs in last 24 hours: Filed Vitals:   10/03/15 0138 10/03/15 0500 10/03/15 0858 10/03/15 0900  BP: 110/60 112/74 103/76 103/76  Pulse: 84 18 73 73  Temp: 98.2 F (36.8 C) 98.6 F (37 C)  98.3 F (36.8 C)  TempSrc: Oral Oral  Oral  Resp: 18 18  20   Height:      Weight:  168 lb 11.2 oz (76.522 kg)    SpO2: 100% 100%  100%   Weight change: 1 lb 4.8 oz (0.59 kg)  Intake/Output Summary (Last 24 hours) at 10/03/15 [id number] Last data filed at 10/03/15 0650  Gross per 24 hour  Intake    600 ml  Output   1300 ml  Net   -700 ml     Gen: Chronically ill-appearing, alert and oriented to person, place, and time Neck: No cervical LAD, no thyromegaly or nodules, no JVD noted. CV: Normal rate, irregularly irregular rhythm, no murmurs, rubs, or gallops Pulmonary: Normal effort, dullness to percussion at the very rt lung base. Decreased breath sounds at the bases R>L. No egophany noted. No crackles or wheezes noted. Abdominal: Soft, non-tender, non-distended, without rebound, guarding, or masses Extremities: Distal pulses 2+ in upper and lower extremities bilaterally, no tenderness, erythema or edema GU: Normal appearing penis without lesions or expressable discharge. Scrotum is mildly swollen, with overlying skin grossly normal although somewhat tender on palpation posteriorly. Testicles are normal in size, without masses, and are nontender. No palpable inguinal lymphadenopathy.   Lab Results: Basic Metabolic Panel:  Recent Labs Lab 09/30/15 0631 10/01/15 1134  NA 145 141  K 4.5 5.0  CL 101 99*  CO2 32 33*  GLUCOSE 147* 86  BUN 19 19  CREATININE 1.53* 1.37*  CALCIUM 9.0 8.9   CBC:  Recent Labs Lab 09/27/15 1448 09/27/15 2139 09/28/15 0516  WBC 11.8*  --  10.5  NEUTROABS  --  7.0  --   HGB 12.7*  --  12.5*  HCT  40.5  --  39.5  MCV 92.7  --  91.4  PLT 193  --  183   Assessment/Plan: 1. Dyspnea - HFrEF (EF 30% in 3/17), persistent AFib, that the patient is ~17lbs up, with JVD and bilateral crackles on exam. Patient with recurrent admissions for shortness of breath, most often labeled "multifocal pneumonia", CXR here shows similar findings to previous films, and CT chest on 09/27/15 revealed findings suggestive of emphysematous changes with asbestos-related pleural disease, with extensive pleural calcification and R>L pleural effusion with associated atelectasis. Likely a mixed picture of a CHF exacerbation with worsening underlying asbestosis and effusion. -Repeat CT chest w con to e/f malignancy in RLL shows interval improvement in aeration -Continue diuresis with furosemide with strict I&Os -Thoracentesis on 5/12; will f/u cytology. Lymphocytic predominant effusion with negative cultures thus far. -Pulmonology following; greatly appreciate the thoughtful care of this mutual patient. Will follow-up outpatient. -Albuterol nebs PRN  -Continue home scheduled budesonide nebs BID, tiotropium -Continue home cardiac meds - metoprolol, diltiazem, digoxin, apixaban, ASA  2. Hydrocele - Intermittent scrotal swelling for several months. US scrotum with no mass, symmetric color flow. He has a left hydrocele and small bilateral epididymal cyst/spermatocele -Consider Urology referral outpatient for possible aspiration  3. Erectile dysfunction - for ~1 yr. Likely organic, due to extensive cardiovascular history, as well as age.  Currently not on nitrates, such as PRN nitro or Imdur, so I am comfortable prescribing him sildenafil on discharge. -Sildenafil PRN on discharge  Dispo: Disposition is deferred at this time, awaiting improvement of current medical problems.  Anticipated discharge tomorrow.   The patient does have a current PCP (Zada Finders, MD) and does need an [facility] hospital follow-up appointment after  discharge.  The patient does have transportation limitations that hinder transportation to clinic appointments.  LOS: 6 days   Norval Gable, MD 10/03/2015, 9:37 AM

## 2015-10-03 NOTE — Progress Notes (Signed)
Dr Merrilyn Puma called CM and trying to figure a way to assist in patient having less frequent respiratory exacerbations that place him back in the hospital. CM called and spoke with a RN at [facility] about education for the family by the [facility] RN and any ways to assist with decreasing his hospitalizations. She suggested the RADAR program. This places a [place] person in the patients home every day for at least a week to lay eyes on him and to assess his condition. After that if he is doing well they designate a person from the family to contact each day to assess how he is doing with specific questions. CM informed Dr Merrilyn Puma of this and orders placed for the program and faxed to [facility].

## 2015-10-03 NOTE — Progress Notes (Signed)
Patient refused to ambulate to chair with staff. Educated, still refused. Will continue to monitor

## 2015-10-03 NOTE — Progress Notes (Signed)
RN received discharge instructions. Patient vocalized understanding of follow up appointment (address, # for each written), medications reviewed, understands indication, dosage, frequency of each, understands that medications were e scribed to [facility], discharge instructions given to patient. Tele removed, IV removed. Will continue to monitor closely.

## 2015-10-03 NOTE — Care Management Note (Signed)
Case Management Note  Patient Details  Name: [name] MRN: [id number] Date of Birth: 01/18/1946  Subjective/Objective:                    Action/Plan: Plan is for patient to discharge home with [facility] services. CM met with the patient and provided him a list of [name] agencies in the [place] area. He stated he was using [facility] prior to going to [facility] and he would like to use them again. Pt also is on home oxygen and CM asked him it he still has his home oxygen and the equipment he needs. [name] stated he had no DME needs and that his oxygen was still at his home and he also stated he had tanks for going out. CM spoke with [facility] and they are willing to pick [name] back up. Information requested was faxed to the number provided: [phone]. [name] asked about who he is suppose to give his completed Medicaid application to. CM spoke with CSW and he is to either take it or mail it to DSS. CM informed [name] of this and he voiced understanding. Will update the bedside RN.   Expected Discharge Date:  09/30/15               Expected Discharge Plan:  [place]  In-House Referral:     Discharge planning Services  CM Consult  Post Acute Care Choice:  Home Health Choice offered to:  Patient  DME Arranged:    DME Agency:     HH Arranged:  RN, PT, OT, Nurse's Aide [place] Agency:  [place]  Status of Service:  Completed, signed off  Medicare Important Message Given:    Date Medicare IM Given:    Medicare IM give by:    Date Additional Medicare IM Given:    Additional Medicare Important Message give by:     If discussed at [place] of Stay Meetings, dates discussed:    Additional Comments:  Pollie Friar, RN 10/03/2015, 11:44 AM

## 2015-10-03 NOTE — Discharge Summary (Signed)
Name: [name] MRN: [id number] DOB: 1945-07-21 70 y.o. PCP: Zada Finders, MD  Date of Admission: 09/27/2015  2:17 PM Date of Discharge: 10/03/2015 Attending Physician: Annia Belt, MD  Discharge Diagnosis: 1. Acute on chronic CHF exacerbation 2. Pleural effusion 3. Progressive asbestosis 4. Hydrocele of testis with scrotal swelling  Principal Problem:   Acute CHF (congestive heart failure) (HCC) Active Problems:   Pleural effusion   Asbestosis ([place])   History of DVT of lower extremity   History of femoral artery thrombosis   CHF (congestive heart failure), NYHA class IV (HCC)   Pleural effusion, right   Scrotal swelling   Chronic atrial fibrillation (HCC)   Hydrocele of testis  Discharge Medications:   Medication List    ASK your doctor about these medications        albuterol 108 (90 Base) MCG/ACT inhaler  Commonly known as:  VENTOLIN HFA  Inhale 2 puffs into the lungs every 6 (six) hours as needed for wheezing or shortness of breath.     apixaban 2.5 MG Tabs tablet  Commonly known as:  ELIQUIS  Take 1 tablet (2.5 mg total) by mouth 2 (two) times daily.     aspirin 81 MG chewable tablet  Chew 1 tablet (81 mg total) by mouth daily.     bisacodyl 10 MG suppository  Commonly known as:  DULCOLAX  Place 1 suppository (10 mg total) rectally daily as needed for severe constipation.     budesonide 0.25 MG/2ML nebulizer solution  Commonly known as:  PULMICORT  Take 2 mLs (0.25 mg total) by nebulization 2 (two) times daily.     buPROPion 75 MG tablet  Commonly known as:  WELLBUTRIN  Take 1 tablet (75 mg total) by mouth 2 (two) times daily.     cholecalciferol 1000 units tablet  Commonly known as:  VITAMIN D  Take 1,000 Units by mouth daily.     Digoxin 62.5 MCG Tabs  Take 0.0625 mg by mouth daily.     diltiazem 120 MG 24 hr capsule  Commonly known as:  CARDIZEM CD  Take 1 capsule (120 mg total) by mouth daily.     diltiazem 30 MG tablet    Commonly known as:  CARDIZEM  Take one tablet by mouth every 4-6 hours as needed for palpitations and or heart rate >100     furosemide 40 MG tablet  Commonly known as:  LASIX  TAKE 1 TABLET BY MOUTH DAILY AS NEEDED FOR EDEMAFOR 5 DAYS     levalbuterol 0.63 MG/3ML nebulizer solution  Commonly known as:  XOPENEX  Take 0.63 mg by nebulization 3 (three) times daily.     metoprolol succinate 100 MG 24 hr tablet  Commonly known as:  TOPROL-XL  Take 100 mg by mouth daily. Take with or immediately following a meal.     mirtazapine 15 MG tablet  Commonly known as:  REMERON  TAKE 1 TABLET BY MOUTH AT BEDTIME     multivitamin with minerals Tabs tablet  Take 1 tablet by mouth daily.     omeprazole 40 MG capsule  Commonly known as:  PRILOSEC  Take 1 capsule (40 mg total) by mouth daily.     rosuvastatin 20 MG tablet  Commonly known as:  CRESTOR  Take 1 tablet (20 mg total) by mouth daily at 6 PM.     senna-docusate 8.6-50 MG tablet  Commonly known as:  [employer] SENNA PLUS  Take 1 tablet by mouth daily as  needed for moderate constipation.     tiotropium 18 MCG inhalation capsule  Commonly known as:  SPIRIVA  Place 1 capsule (18 mcg total) into inhaler and inhale daily.     Urea 37.5 % Crea  Apply 1 application topically daily as needed (for irritation).        Disposition and follow-up:   [name] was discharged from [facility] in Good condition.  At the hospital follow up visit please address:  1.  Is he still without worsening shortness of breath? Have his home health services resumed? Has an RN come by to reconcile his medications? Has the RADAR program started [facility] person in his home every day for at least 1-2 weeks)? Remind him that he has a clinic appointment with cardiology (5/31 - [place]) and pulmonology (6/12 - [place])  -Also, consider a referral to urology if the patient is interested in drainage of his hydrocele (if his scrotal swelling is still  irritating him enough)  2.  Labs / imaging needed at time of follow-up: BMP (recheck Cr. If too high, consider decreasing furosemide to 20mg  PO QD)  3.  Pending labs/ test needing follow-up: Pleural fluid cytology  Follow-up Appointments:     Follow-up Information    Follow up with [facility] .   Specialty:  Skilled Nursing Facility   Contact information:   [address] [place] [phone]      Consultations:  Pulmonary  Procedures Performed:  Dg Chest 2 View  09/27/2015  CLINICAL DATA:  Shortness of Breath EXAM: CHEST  2 VIEW COMPARISON:  09/21/2015 FINDINGS: Cardiomediastinal silhouette is stable. Dual lead cardiac pacemaker is unchanged in position. There is interval small to moderate right pleural effusion with right basilar atelectasis or infiltrate. Stable left pleural effusion with left basilar atelectasis. Stable patchy airspace disease left perihilar and left upper lobe. Persistent patchy airspace is right perihilar probable multifocal pneumonia. IMPRESSION: There is interval small to moderate right pleural effusion with right basilar atelectasis or infiltrate. Stable left pleural effusion with left basilar atelectasis. Stable patchy airspace disease left perihilar and left upper lobe. Persistent patchy airspace is right perihilar probable multifocal pneumonia. Electronically Signed   By: Lahoma Crocker M.D.   On: 09/27/2015 15:07   Dg Chest 2 View  09/21/2015  CLINICAL DATA:  Weakness, pneumonia EXAM: CHEST  2 VIEW COMPARISON:  09/21/2015 FINDINGS: Two lead cardiac pacer stable.  Mild cardiac enlargement unchanged. Increased opacity in the medial right lower lobe stable. Patchy opacity left mid lung and left lower lobe stable. A small pleural fluid component is suspected. IMPRESSION: Suspect small pleural effusions with bilateral multifocal patchy airspace disease again concerning for pneumonia. Electronically Signed   By: Skipper Cliche  M.D.   On: 09/21/2015 14:55   Ct Head Wo Contrast  09/19/2015  CLINICAL DATA:  Altered mental status EXAM: CT HEAD WITHOUT CONTRAST TECHNIQUE: Contiguous axial images were obtained from the base of the skull through the vertex without intravenous contrast. COMPARISON:  Oct 11, 2014 FINDINGS: There is no midline shift, hydrocephalus, or mass. No acute hemorrhage or acute transcortical infarct is identified. There is chronic diffuse atrophy. Chronic bilateral periventricular white matter small vessel ischemic changes noted. The bony calvarium is intact. The visualized sinuses are clear. IMPRESSION: No focal acute intracranial abnormality identified. Chronic diffuse atrophy. Electronically Signed   By: Abelardo Diesel M.D.   On: 09/19/2015 18:32   Ct Chest Wo Contrast  10/02/2015  CLINICAL DATA:  Pleural effusion. EXAM: CT CHEST WITHOUT CONTRAST TECHNIQUE: Multidetector CT imaging of the chest was performed following the standard protocol without IV contrast. COMPARISON:  09/27/2015 FINDINGS: Mediastinum / Lymph Nodes: There is no axillary lymphadenopathy. 7 mm short axis prevascular lymph node on the previous study has decreased to 3 mm today. Right hilar lymph node measured on the previous study cannot be reliably measured on today's exam given the lack of intravenous contrast material. There was a subcarinal lymph node on the prior study measuring 12 mm in short axis. This is stable at 12 mm. The heart is enlarged. Coronary artery calcification is noted. Right-sided permanent pacemaker again noted. No pericardial effusion. Lungs / Pleura: Asymmetric, left greater than right apical scarring again noted. There is centrilobular and paraseptal emphysema in the lungs. Right pleural effusion has decreased in the interval. Calcified pleural plaques are seen bilaterally, left greater than right persistent. Chronic atelectasis and scarring is noted in the lungs bilaterally, slightly improved on the left. Upper Abdomen:   Unremarkable. MSK / Soft Tissues: Bone windows reveal no worrisome lytic or sclerotic osseous lesions. IMPRESSION: 1. Interval decrease in right pleural effusion. The volume loss/ collapse/scarring seen in the lower lobes persists, but shows evidence of interval improvement, left greater than right. Interval decreasing pleural fluid volume and improved basilar expansion bilaterally is reassuring. Resolving pneumonia or atelectasis view consideration. Consider continued follow-up as there is extensive bilateral lower lobe disease, likely asbestos related. 2. Bilateral calcified pleural plaques, compatible with previous asbestos exposure. 3. Stable to decreased mediastinal lymphadenopathy. 4. Cardiomegaly 5. Emphysema. Electronically Signed   By: Misty Stanley M.D.   On: 10/02/2015 16:59   Ct Chest W Contrast  09/27/2015  CLINICAL DATA:  Dyspnea. EXAM: CT CHEST WITH CONTRAST TECHNIQUE: Multidetector CT imaging of the chest was performed during intravenous contrast administration. CONTRAST:  1 ISOVUE-300 IOPAMIDOL (ISOVUE-300) INJECTION 61% COMPARISON:  06/02/2013 chest CT. Chest radiograph from earlier today. FINDINGS: Mediastinum/Nodes: Stable mild cardiomegaly. Two lead right subclavian pacemaker is noted with lead tips in the right ventricular apex. No pericardial fluid/thickening. Left anterior descending coronary atherosclerosis. 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 Dilated main pulmonary artery (3.6 cm diameter), increased from 3.3 cm. No central pulmonary emboli. Normal visualized thyroid. Normal esophagus. No axillary adenopathy. Mildly enlarged 1.1 cm subcarinal node (series 4/ image 71), new. Mild bilateral hilar adenopathy, new, largest 1.3 cm on the right (series 4/ image 59) and 1.1 cm on the left (series 4/image 60). Lungs/Pleura: No pneumothorax. There are extensive pleural plaques throughout the left greater than right pleural spaces, unchanged. Small right greater than left pleural  effusions are increased bilaterally. There is a pleural-based 1.8 x 1.6 cm nodular focus of consolidation in the posterior left lower lobe (series 5/ image 95) with surrounding parenchymal bands, mildly increased from 1.7 x 1.2 cm on 06/02/2013. There is a separate thick transpulmonic bandlike focus of consolidation at the periphery of the left upper and left lower lobes with associated volume loss and distortion, significantly increased from 06/02/2013 (series 5/image 60). There is near complete consolidation and significant volume loss in the right lower lobe, significantly worsened since 06/02/2013. There is mild centrilobular emphysema and diffuse bronchial wall thickening. There is interlobular septal thickening in both lungs, predominantly in the upper lobes. Upper abdomen: Unremarkable. Musculoskeletal: No aggressive appearing focal osseous lesions. Mild degenerative changes in the thoracic spine. Symmetric mild gynecomastia. IMPRESSION: 1. Findings of asbestos related pleural disease, including extensive calcified pleural plaques in  the left greater than right pleural spaces and small bilateral pleural effusions, right greater than left, increased from 2015. 2. Near complete right lower lobe consolidation, volume loss and distortion. Thick transpulmonic bandlike opacity in the periphery of the left lung and nodular pleural based focus of consolidation in the basilar left lower lobe, with associated distortion and volume loss. These findings have significantly worsened since 2015 and are favored to represent the progressive sequela of asbestos related pleural disease with rounded atelectasis and extensive lung scarring. An underlying component of pneumonia or tumor is difficult to exclude. Management options include short-term chest CT follow-up and/or PET-CT. 3. New mild subcarinal and bilateral hilar adenopathy, nonspecific. 4. Mild cardiomegaly. Increased dilatation of the main pulmonary artery, suggesting  worsening pulmonary hypertension. Mild interlobular septal thickening in the upper lobes suggests a component of mild pulmonary edema. Electronically Signed   By: Ilona Sorrel M.D.   On: 09/27/2015 19:34   US Scrotum  09/30/2015  CLINICAL DATA:  Left more than right scrotal swelling for more than 1 year. EXAM: ULTRASOUND OF SCROTUM TECHNIQUE: Complete ultrasound examination of the testicles, epididymis, and other scrotal structures was performed. COMPARISON:  10/13/2014 CT FINDINGS: Right testicle Measurements: 4.8 x 2.9 x 2.9 cm. Mildly heterogeneous without focal mass. Color flow appears normal. Left testicle Measurements: 4.6 x 3.2 x 3.0 cm. Mildly heterogeneous without focal mass. Color flow appears normal. Right epididymis:  0.3 x 0.2 x 0.4 cm cyst present. Left epididymis:  0.6 x 0.6 x 0.5 cm cyst present. Hydrocele:  Left hydrocele present. Varicocele:  None visualized. IMPRESSION: 1. No evidence for testicular mass. Symmetric color flow in the testicles bilaterally. Full Doppler evaluation not performed. 2. Left hydrocele likely accounting for the patient's symptoms of scrotal swelling. 3. Small bilateral epididymal cyst/ spermatocele. Electronically Signed   By: Nolon Nations M.D.   On: 09/30/2015 18:33   Dg Chest Port 1 View  09/29/2015  CLINICAL DATA:  Status post right thoracentesis EXAM: PORTABLE CHEST 1 VIEW COMPARISON:  09/27/2015 FINDINGS: Cardiac shadow is stable. Pacing device is again seen and stable. Left pleural effusion is again seen and stable. Chronic changes in the left mid lung are again noted. There is been interval right-sided thoracentesis with significant reduction in the right-sided pleural effusion. No pneumothorax is seen. No new focal abnormality is noted. IMPRESSION: No pneumothorax following thoracentesis on the right. Electronically Signed   By: Inez Catalina M.D.   On: 09/29/2015 14:31   Dg Chest Port 1 View  09/21/2015  CLINICAL DATA:  Acute respiratory failure EXAM:  PORTABLE CHEST 1 VIEW COMPARISON:  Oval chest x-ray of 09/20/2015 at, 08/13/2015 and 06/15/2005 FINDINGS: Aeration of the left lung base has improved somewhat. Chronic changes throughout left hemithorax are stable. Patchy airspace disease is again noted in the left mid upper lung field and medially at the right lung base suspicious for foci of pneumonia. A permanent pacemaker remains and cardiomegaly is stable. IMPRESSION: 1. Somewhat improved aeration particular at the left lung base. 2. Patchy airspace disease in the left upper mid lung and right medial lung base suspicious for pneumonia. Electronically Signed   By: Ivar Drape M.D.   On: 09/21/2015 08:16   Dg Chest Port 1 View  09/20/2015  CLINICAL DATA:  Initial evaluation for acute respiratory failure. EXAM: PORTABLE CHEST 1 VIEW COMPARISON:  Prior radiograph from 09/19/2015. FINDINGS: Cardiomegaly unchanged. Mediastinal silhouette within normal limits. Atheromatous plaque within the aortic arch. Right-sided pacemaker/ AICD again noted. Similar low  lung volumes. Probable left pleural effusion. Persistent patchy and confluent left upper and lower lobe opacity, slightly progressed from prior. Improved aeration of the right lung base with improved right basilar opacity. Similar left fibrothorax. No pneumothorax identified. Mild vascular congestion without pulmonary edema. Osseous structures unchanged. IMPRESSION: 1. Slight interval worsening and patchy and confluent opacity involving the left upper and lower lobes as compared to prior radiograph from 09/19/2015. 2. Improved aeration of the right lung base with improved right basilar opacity. 3. Similar left fibrothorax. Persistent suspected small left pleural effusion. Electronically Signed   By: Jeannine Boga M.D.   On: 09/20/2015 05:39   Dg Chest Portable 1 View  09/19/2015  CLINICAL DATA:  70 year old male with altered mental status discovered at 1300 hours today. Initial encounter. EXAM: PORTABLE  CHEST 1 VIEW COMPARISON:  08/24/2015 and earlier. FINDINGS: Portable AP semi upright view at 1458 hours. Unresolved Patchy and confluent left upper lobe and lung base opacity compared to April. Progressed and now all confluent right lung base opacity obscuring the diaphragm. Superimposed small left pleural effusion suspected. Left fibrothorax demonstrated on chest CT 10/13/2014. Stable cardiomegaly and mediastinal contours. Stable right chest cardiac pacemaker. No superimposed pneumothorax or pulmonary edema. Calcified aortic atherosclerosis. IMPRESSION: 1. Progressed bilateral patchy and confluent airspace opacity since April most suggestive of bilateral pneumonia. Consider aspiration in this setting. 2. 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 Electronically Signed   By: Genevie Ann M.D.   On: 09/19/2015 15:13   2D Echo: None  Cardiac Cath: None  Admission HPI: Pt with HTN, chronic systolic heart failure EF 30% 07/2015, GERD, depression, bipolar, COPD on home 3 L o2, and CKD who was recently discharged on 5/5. Pt was at [facility] and he was having increasing shortness of breath and tightness in the chest.   He denies fevers or chills but he reports intermittent cough with white sputum production without blood. He has orthopnea, PND, and uses 1 pillow at night. His baseline weight prior to admission last month was 146 lbs, and today it was 171 lbs. He denies taking lasix since he was discharged. He denies fevers, abd pain, LE swelling or edema. He says that he did not receive his gabapentin once he was discharged from here. He noticed his oxygen requirement has gone up from 3 to 4 L.  He also reports pleuritic chest pain, in mid-sternal area that started yesterday. The pain does not radiate anywhere and it comes and goes. He associates with shortness of breath. The pain is present on palpation. He denies heartburn.   SHx: He has not used any of his inhalers in a  while. He has not smoked since March. Does not do illicit drugs.  5/2-5/5- He was recently admitted for acute on chronic hypercapneic failure. He was d/c from SNF on 4/30 and was home for 1 day before his wife had found him with jerk-like movements. He had required biPap and ICU admission and was transferred to IMTS on 5/4. Vanc was d/ced and cefepime was changed to clindamycin given the possibility of aspiration pneumonia given AMS. PT recommended returning to SNF. Prior to that he was admitted 3/25 to 3/31 for PNA and afib with RVR.   Hospital Course by problem list: Principal Problem:   Acute CHF (congestive heart failure) (HCC) Active Problems:   Pleural effusion   Asbestosis ([place])   History of DVT of lower extremity   History of femoral artery thrombosis   CHF (  congestive heart failure), NYHA class IV (HCC)   Pleural effusion, right   Scrotal swelling   Chronic atrial fibrillation (HCC)   Hydrocele of testis   1. Acute on chronic CHF exacerbation - The patient presented with progressive shortness of breath after recently being discharged 5 days prior. The patient was ~10lbs up from his dry weight, with JVD and bilateral crackles on exam and did not take the furosemide prescribed to him on discharge. Although he did receive broad-spectrum antibiotics in the ED, his stable CXR, lack of leukocytosis or fevers, and negative serial procalcitonins all supported that he did not have pneumonia. He was not given any antibiotics outside of his first day in the ED, and had great improvement in his respiratory symptoms with diuresis (initially IV furosemide, transitioned to furosemide 40mg  PO QD for the last few days of admission) as well as thoracentesis. He was 7 lbs lighter on discharge than admission. It was believed that his current symptoms and likely most recent few admissions had been in fact due to a combination of CHF and progressive asbestosis with pleural effusion s/p drainage (pending  further characterization).  2. Pleural effusion - given the patients recurrent admissions over the past few months, with frequent exacerbations of respiratory distress attributed to pneumonia despite little improvement with antibiotics, CT chest was obtained to evaluate for effusion (possibly parapneumonic) as well as parenchymal disease given that his last CT was at least 1 year ago. This revealed findings suggestive of emphysematous changes in combination with asbestos-related pleural disease, with extensive calcified pleural plaques and R>L pleural effusion with associated rounded atelectasis. Pulmonology was consulted, who performed thoracentesis of the R pleural effusion, yielding 461mL, with great improvement in his symptoms. Pleural fluid analysis showed a transudative effusion (reassuring) with lymphocytic predominance and negative cultures, with cytology pending at discharge (looking for mesothelial cells or malignant cells especially). A repeat CT showed interval improvement in atelectasis, with appointment set-up with Dr. Vaughan Browner in pulmonology next month.  3. Progressive asbestosis - CT chest was obtained on admission as noted above, revealing findings consistent with progressive asbestosis (previously seen on CT 2 years ago but not further pursued). No apparent exposure risk factors were found besides ground work in [place]. No evidence of mesothelioma was apparent with his current imaging. 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 Pulmonology was consulted and outpatient follow-up was arranged for him to establish care in their clinic, with possibility of PET +/- bronchoscopy in the future to evaluate for malignancy.   4. Hydrocele of testis with scrotal swelling - during hospitalization, he complained of intermittent scrotal swelling for several months with intermittent discomfort. Physical exam revealed mild scrotal swelling, with overlying skin grossly normal although somewhat  tender on palpation posteriorly. Testicles were normal in size, without masses, and nontender. No palpable inguinal lymphadenopathy was noted and he had no genital lesions.  US scrotum showed no mass, symmetric color flow, revealing a left hydrocele and small bilateral epididymal cyst/spermatocele. Consider Urology referral outpatient for possible aspiration.  Discharge Vitals:   BP 103/76 mmHg  Pulse 73  Temp(Src) 98.3 F (36.8 C) (Oral)  Resp 20  Ht 6\' 1"  (1.854 m)  Wt 168 lb 11.2 oz (76.522 kg)  BMI 22.26 kg/m2  SpO2 100%  Discharge Labs:  No results found. However, due to the size of the patient record, not all encounters were searched. Please check Results Review for a complete set of results.  Signed: Norval Gable,  MD 10/03/2015, 9:41 AM    Services Ordered on Discharge: Home health services (including RN for in-home med rec), RADAR Program Equipment Ordered on Discharge: None

## 2015-10-03 NOTE — Progress Notes (Signed)
Nurse tech helped patient to car with family. Patient had called earlier to family to have oxygen tank ready for patient use, as patient refused equipment from hospital. Patient's home tank empty, nurse tech called nurse to notify and find solution for ride home, before RN had called back, patient decided to leave. Patient stated his house was only 8 miles away, he had spare tanks ready for use at home. Patient was told by nurse tech that RN was finding solution, patient still left for home.

## 2015-10-03 NOTE — Discharge Instructions (Signed)

## 2015-10-04 DIAGNOSIS — N189 Chronic kidney disease, unspecified: Secondary | ICD-10-CM | POA: Diagnosis not present

## 2015-10-04 DIAGNOSIS — I130 Hypertensive heart and chronic kidney disease with heart failure and stage 1 through stage 4 chronic kidney disease, or unspecified chronic kidney disease: Secondary | ICD-10-CM | POA: Diagnosis not present

## 2015-10-04 DIAGNOSIS — J449 Chronic obstructive pulmonary disease, unspecified: Secondary | ICD-10-CM | POA: Diagnosis not present

## 2015-10-04 DIAGNOSIS — R918 Other nonspecific abnormal finding of lung field: Secondary | ICD-10-CM | POA: Diagnosis not present

## 2015-10-04 DIAGNOSIS — I739 Peripheral vascular disease, unspecified: Secondary | ICD-10-CM | POA: Diagnosis not present

## 2015-10-04 DIAGNOSIS — F329 Major depressive disorder, single episode, unspecified: Secondary | ICD-10-CM | POA: Diagnosis not present

## 2015-10-04 DIAGNOSIS — I5022 Chronic systolic (congestive) heart failure: Secondary | ICD-10-CM | POA: Diagnosis not present

## 2015-10-04 DIAGNOSIS — F319 Bipolar disorder, unspecified: Secondary | ICD-10-CM | POA: Diagnosis not present

## 2015-10-04 DIAGNOSIS — I4891 Unspecified atrial fibrillation: Secondary | ICD-10-CM | POA: Diagnosis not present

## 2015-10-05 ENCOUNTER — Other Ambulatory Visit: Payer: Self-pay | Admitting: *Deleted

## 2015-10-05 NOTE — Patient Outreach (Signed)
[facility]) Care Management  10/05/2015  CORDARIO BRAUDE 01/09/1946 [id number]   RN attempted to reach pt for possible [facility] services however unsuccessful. RN able to leave a HIPAA approved voice message requesting a call back. Will continue attempts to reach this pt for ongoing needs. Note [facility]. (Social worker continues to be involved with this care).  [name], RN Care Management Coordinator [place] Office [phone]

## 2015-10-05 NOTE — Patient Outreach (Signed)
[facility]) Care Management  10/05/2015  [name] [birth year] [id number]   CSW drove out to patient's home for the scheduled home visit, which CSW was able to arrange with patient during recent hospitalization; however, patient was unavailable.  CSW knocked several times, on several different occasions, as well as tried calling patient on his phone, without success.  Two HIPAA compliant messages were left for patient on voicemail, explaining the reason for the call, encouraging patient to return CSW's call at his earliest convenience.  CSW is currently awaiting a return call.  CSW will update patient's RNCM with [place] Management, [name]. [name], BSW, MSW, LCSW  Licensed Education officer, environmental [facility] System  Mailing [place] [address] [place], [place] [address] Physical Address-[address], [place],  [address] Toll Free Main # [phone] Fax # [phone] Cell # [phone]  Fax # [phone]  [name] [employer]  Discrimination is Against the [employer]. and its subsidiaries comply with applicable Federal civil rights laws and do not discriminate on the basis of race, color, national origin, age, disability, or sex. [facility] do not exclude people or treat them differently because of race, color, national origin, age, disability, or sex.    [employer]. and its subsidiaries provide:  . Free auxiliary aids and services, such as qualified sign language interpreters, video remote interpretation, and written information in other formats to people with disabilities when such auxiliary aids and services are necessary to ensure an equal opportunity to participate. . Free language services to people whose primary language is not English when those services are necessary to provide meaningful access, such as translated documents  or oral interpretation.    If you need these services, call [phone] or if you use a TTY, call 711.   If you believe that [employer]. and its subsidiaries have failed to provide these services or discriminated in another way on the basis of race, color, national origin, age, disability, or sex, you can file a Tourist information centre manager with:   [place] [address] [address]   If you need help filing a grievance, call [phone] or if you use a TTY, call 711.  You can also file a civil rights complaint with the U.S. Department of Health and Financial controller, Office for Civil Rights electronically through the Office for Civil Rights Complaint Portal, available at OnSiteLending.nl.jsf, or by mail or phone at:   Jet. Department of Health and Human Services  [place], [place], [facility] Building  [place], [place]  [phone], [phone] (TDD)  Complaint forms are available at CutFunds.si             [place]: ATTENTION: If you do not speak English, language assistance services, free of charge, are available to you. Call [phone]  (TTY: [id number]).  Espaol (Spanish): ATENCIN: si habla espaol, tiene a su disposicin servicios gratuitos de asistencia lingstica. Llame al [phone] (TTY: [id number]).  ???? (Chinese): ?????????????????????????????? [phone] (TTY:711??  Ti?ng Vi?t (Vietnamese): CH : N?u b?n ni Ti?ng Vi?t, c cc d?ch v? h? tr? ngn ng? mi?n ph dnh cho b?n. G?i s? [phone] (TTY: [id number]).  ??? ([place]rones[place]): ?? : ???? ????? ?? , ?? ?? ???? ??? ???? ? ???? . [phone] (TTY: 711)??? ??? ???? .  Tagalog (Tagalog - Filipino): PAUNAWA: Kung nagsasalita ka ng Tagalog, maaari kang gumamit ng  mga serbisyo ng tulong sa wika nang walang bayad. [place] sa [phone] (TTY: [id number]).   Reunion):  :      ,      .  [phone] (: [id number]).  Kreyl Ayisyen ([place]): ATANSYON: Si w pale Ethelene Hal, gen svis d pou lang ki disponib gratis pou ou. Rele [phone] (TTY: [id number]).  Fonnie Jarvis Marland KitchenPakistan): ATTENTION : Si vous parlez franais, des services d'aide linguistique vous sont proposs gratuitement. Appelez le [phone] (ATS : [id number]).  Polski (Polish): UWAGA: Jeeli mwisz po polsku, moesz skorzysta z bezpatnej pomocy jzykowej. Zadzwo pod numer [phone] (TTY: [id number]).  Portugus ([place]): ATENO: Se fala portugus, encontram-se disponveis servios lingusticos, grtis. Ligue para [phone] (TTY: R9478181).   Italiano ([place]): ATTENZIONE: In caso la lingua parlata sia l'italiano, sono disponibili servizi di assistenza linguistica gratuiti. Chiamare il numero [phone] (TTY: [id number]).  Dawayne Patricia ([place]orea): ACHTUNG: Wenn Sie Deutsch sprechen, stehen Ihnen kostenlos sprachliche Hilfsdienstleistungen zur [employer]. Rufnummer: [phone] (TTY: [id number]).   (Arabic): [phone]   .            : .)[id number] :   (  ??? ([place]): ??????????????????????????????????[phone] ?TTY?711?????????????????  ? (Farsi): [phone]  . ?   ? ?  ? ? ?~ ?  ?    : .??  (TTY: 711)  Din Bizaad (Navajo): D77 baa ak0 n7n7zin: D77 saad bee y1n7[ti'go Risa Grill, saad bee 1k1'1n7da'1wo'd66', t'11 Pricilla Loveless n1 h0l=, koj8' h0d77lnih [phone] (TTY: [id number]).

## 2015-10-06 DIAGNOSIS — I130 Hypertensive heart and chronic kidney disease with heart failure and stage 1 through stage 4 chronic kidney disease, or unspecified chronic kidney disease: Secondary | ICD-10-CM | POA: Diagnosis not present

## 2015-10-06 DIAGNOSIS — I4891 Unspecified atrial fibrillation: Secondary | ICD-10-CM | POA: Diagnosis not present

## 2015-10-06 DIAGNOSIS — F319 Bipolar disorder, unspecified: Secondary | ICD-10-CM | POA: Diagnosis not present

## 2015-10-06 DIAGNOSIS — J449 Chronic obstructive pulmonary disease, unspecified: Secondary | ICD-10-CM | POA: Diagnosis not present

## 2015-10-06 DIAGNOSIS — R918 Other nonspecific abnormal finding of lung field: Secondary | ICD-10-CM | POA: Diagnosis not present

## 2015-10-06 DIAGNOSIS — N189 Chronic kidney disease, unspecified: Secondary | ICD-10-CM | POA: Diagnosis not present

## 2015-10-06 DIAGNOSIS — F329 Major depressive disorder, single episode, unspecified: Secondary | ICD-10-CM | POA: Diagnosis not present

## 2015-10-06 DIAGNOSIS — I5022 Chronic systolic (congestive) heart failure: Secondary | ICD-10-CM | POA: Diagnosis not present

## 2015-10-06 DIAGNOSIS — I739 Peripheral vascular disease, unspecified: Secondary | ICD-10-CM | POA: Diagnosis not present

## 2015-10-08 ENCOUNTER — Telehealth: Payer: Self-pay | Admitting: Internal Medicine

## 2015-10-08 MED ORDER — DIGOXIN 62.5 MCG PO TABS: 0.0625 mg | ORAL_TABLET | Freq: Every day | ORAL | Status: DC

## 2015-10-08 MED ORDER — OMEPRAZOLE 40 MG PO CPDR: 40.0000 mg | DELAYED_RELEASE_CAPSULE | Freq: Every day | ORAL | Status: DC

## 2015-10-08 MED ORDER — ROSUVASTATIN CALCIUM 20 MG PO TABS: 20.0000 mg | ORAL_TABLET | Freq: Every day | ORAL | Status: DC

## 2015-10-08 MED ORDER — BUPROPION HCL 75 MG PO TABS: 75.0000 mg | ORAL_TABLET | Freq: Two times a day (BID) | ORAL | Status: DC

## 2015-10-08 MED ORDER — VITAMIN D 1000 UNITS PO TABS: 1000.0000 [IU] | ORAL_TABLET | Freq: Every day | ORAL | Status: DC

## 2015-10-08 NOTE — Telephone Encounter (Signed)
Patient called asking for Dr. Merrilyn Puma. I notified him that he is not currently available. I offered to help him. He stated he is having trouble with his medication. He wouldn't tell me exactly what he needed. Kept asking for Dr. Merrilyn Puma.  I relayed the message to Dr. Merrilyn Puma. He will be in contact with the patient.

## 2015-10-08 NOTE — Telephone Encounter (Signed)
   Reason for call:   I received a call from [name] at 2:00  PM indicating that some of his medications were out of refills.   Pertinent Data:   We went through his list, and he said that while he did have and was taking furosemide 40mg  daily, diltiazem 120mg  XL and 30mg  PRN, metoprolol, eliquis, and ASA, his prescriptions for bupropion, digoxin, vitamin D, rosuvastatin, and omeprazole were out of refills.   Additionally, he confirms that he continues to remain free from any shortness of breath. He did feel a bit dizzy this morning at first without other symptoms, but feels well currently.   Assessment / Plan / Recommendations:   Placed refills for the above medications and [name] confirmed he would pick them up today  Please follow-up that the patient has all his medications at his visit on 10/11/2015.  As instructed in his DCS, recheck Cr given that he is now on furosemide 40mg  QD scheduled.  If still complains of dizziness at next visit, check orthostatic vitals as his only recent med change is the scheduled furosemide 40mg . If positive and still asymptomatic from a respiratory standpoint, consider decreasing to 20mg  QD. Otherwise, continue current dose.  As always, pt is advised that if symptoms worsen or new symptoms arise, they should go to an urgent care facility or to to ER for further evaluation.   Norval Gable, MD   10/08/2015, 2:41 PM

## 2015-10-09 ENCOUNTER — Other Ambulatory Visit: Payer: Self-pay | Admitting: *Deleted

## 2015-10-09 ENCOUNTER — Telehealth: Payer: Self-pay

## 2015-10-09 ENCOUNTER — Encounter: Payer: Self-pay | Admitting: *Deleted

## 2015-10-09 DIAGNOSIS — N189 Chronic kidney disease, unspecified: Secondary | ICD-10-CM | POA: Diagnosis not present

## 2015-10-09 DIAGNOSIS — F329 Major depressive disorder, single episode, unspecified: Secondary | ICD-10-CM | POA: Diagnosis not present

## 2015-10-09 DIAGNOSIS — I5022 Chronic systolic (congestive) heart failure: Secondary | ICD-10-CM | POA: Diagnosis not present

## 2015-10-09 DIAGNOSIS — J449 Chronic obstructive pulmonary disease, unspecified: Secondary | ICD-10-CM | POA: Diagnosis not present

## 2015-10-09 DIAGNOSIS — I4891 Unspecified atrial fibrillation: Secondary | ICD-10-CM | POA: Diagnosis not present

## 2015-10-09 DIAGNOSIS — R918 Other nonspecific abnormal finding of lung field: Secondary | ICD-10-CM | POA: Diagnosis not present

## 2015-10-09 DIAGNOSIS — F319 Bipolar disorder, unspecified: Secondary | ICD-10-CM | POA: Diagnosis not present

## 2015-10-09 DIAGNOSIS — I130 Hypertensive heart and chronic kidney disease with heart failure and stage 1 through stage 4 chronic kidney disease, or unspecified chronic kidney disease: Secondary | ICD-10-CM | POA: Diagnosis not present

## 2015-10-09 DIAGNOSIS — I739 Peripheral vascular disease, unspecified: Secondary | ICD-10-CM | POA: Diagnosis not present

## 2015-10-09 NOTE — Patient Outreach (Addendum)
[facility]) Care Management  [place]  10/09/2015   [name] May 03, 1946 [id number]  Introduction to the [facility] program as pt interested in enrolling. Subjective:   HF: Pt states he is familiar with HF in general but not the zones. States he weighs daily with a reported weight today at 172bs, yesterday 171 lbs and last week 173 lbs. Denies ant swelling or symptoms related to HF.  MEDICATION: Pt indicated he had issues with his medications however confirmed his provider's office has call in all his medications and he has obtained the medications with no additional issues. States [facility] involved and the RN has arranged a pillbox that he now follows with no additional issues. Pt states his daughter can assist once [facility] is no longer able to fill his box. Pt receptive to case management with managing his HF and learning more about the HF zones.  Pt reports he has [facility] with [facility] for PT/OT, aide (2x weekly) and RN.    Objective:   Encounter Medications:  Outpatient Encounter Prescriptions as of 10/09/2015  Medication Sig  . albuterol (VENTOLIN HFA) 108 (90 BASE) MCG/ACT inhaler Inhale 2 puffs into the lungs every 6 (six) hours as needed for wheezing or shortness of breath.  [name] apixaban (ELIQUIS) 5 MG TABS tablet Take 1 tablet (5 mg total) by mouth 2 (two) times daily.  [name] aspirin 81 MG chewable tablet Chew 1 tablet (81 mg total) by mouth daily.  . bisacodyl (DULCOLAX) 10 MG suppository Place 1 suppository (10 mg total) rectally daily as needed for severe constipation.  [name] buPROPion (WELLBUTRIN) 75 MG tablet Take 1 tablet (75 mg total) by mouth 2 (two) times daily.  . cholecalciferol (VITAMIN D) 1000 units tablet Take 1 tablet (1,000 Units total) by mouth daily.  . Digoxin 62.5 MCG TABS Take 0.0625 mg by mouth daily.  [name] diltiazem (CARDIZEM CD) 120 MG 24 hr capsule Take 1 capsule (120 mg total) by mouth daily.  [name] diltiazem (CARDIZEM) 30 MG tablet Take  one tablet by mouth every 4-6 hours as needed for palpitations and or heart rate >100  . furosemide (LASIX) 40 MG tablet Take 1 tablet (40 mg total) by mouth daily.  . metoprolol succinate (TOPROL-XL) 100 MG 24 hr tablet Take 100 mg by mouth daily. Take with or immediately following a meal.  . mirtazapine (REMERON) 15 MG tablet TAKE 1 TABLET BY MOUTH AT BEDTIME  . Multiple Vitamin (MULTIVITAMIN WITH MINERALS) TABS tablet Take 1 tablet by mouth daily.  [name] omeprazole (PRILOSEC) 40 MG capsule Take 1 capsule (40 mg total) by mouth daily.  . rosuvastatin (CRESTOR) 20 MG tablet Take 1 tablet (20 mg total) by mouth daily at 6 PM.  . senna-docusate (CVS SENNA PLUS) 8.6-50 MG tablet Take 1 tablet by mouth daily as needed for moderate constipation.  [name] tiotropium (SPIRIVA) 18 MCG inhalation capsule Place 1 capsule (18 mcg total) into inhaler and inhale daily.  . Urea 37.5 % CREA Apply 1 application topically daily as needed (for irritation). (Patient not taking: Reported on 10/09/2015)   No facility-administered encounter medications on file as of 10/09/2015.    Functional Status:  In your present state of health, do you have any difficulty performing the following activities: 10/09/2015 09/27/2015  Hearing? N N  Vision? N N  Difficulty concentrating or making decisions? N N  Walking or climbing stairs? Y Y  Dressing or bathing? Y N  Doing errands, shopping? [name]  Preparing  Food and eating ? N -  Using the Toilet? N -  In the past six months, have you accidently leaked urine? Y -  Do you have problems with loss of bowel control? N -  Managing your Medications? Y -  Managing your Finances? Y -  Housekeeping or managing your Housekeeping? Y -    Fall/Depression Screening: PHQ 2/9 Scores 10/09/2015 09/10/2015 07/28/2015 07/19/2015 06/27/2015 06/16/2015 05/03/2015  PHQ - 2 Score 0 0 4 1 0 3 2  PHQ- 9 Score - - 15 - - 10 8  Exception Documentation - - - - - - -    Assessment:   Case management related to  HF Adherence related medication  Plan:  Will confirm pt interest in the [facility] program for enrollment and began teaching on HF zones and what to do if acute symptoms should occur. Will confirm pt is in the GREEN zone and aware what to do if acute. Will further discuss HF on the scheduled home visit tomorrow. Will present EMMI and other printed educational material to assist pt in managing his HF. Will review all medications and confirm pt has his prescribed medications and administering all medications as directed by his providers. Will also alert pt if issues with his medications [facility] has pharmacy assistance program that are available.  Plan of care discussed with this pt and goals that pt feels he is able to reach. Will reiterated this on home visit tomorrow with this pt. Will also alert [facility] social worker for possible dual visit with RN and social worker if available for home visit tomorrow.  Note also alerted social worker that pt has a [place] application that he needs assistance with getting to DSS. Note pt has a primary provider's visit 5/24 and RN has already spoke with Dr. Serita Grit nurse Truman Hayward) today concerning [facility] involvement and plans for home visit on tomorrow.  Note this assessment will be send to primary provider today.   [facility] CM Care Plan Problem One        Most Recent Value   Care Plan Problem One  Recent hospitalization   Role Documenting the Problem One  Care Management [place] for Problem One  Active   [facility] Long Term Goal (31-90 days)  Pt will not have a hospitalization related HF over the next 90 days.   [facility] Long Term Goal Start Date  10/09/15   Interventions for Problem One Long Term Goal  Discussed HF zones and the importance of daily monitoring to prevent readmission   [facility] CM Short Term Goal #1 (0-30 days)  Pt will complete daily weights inmonitoing his HF over the next 30 days.   [facility] CM Short Term Goal #1 Start Date  10/09/15   Interventions for Short Term Goal #1   Discussed the importance of daily weights in monitoring fluid retention that can precipitate.    [facility] CM Care Plan Problem Two        Most Recent Value   Care Plan Problem Two  Medication adherence   Role Documenting the Problem Two  Care Management Coordinator   Care Plan for Problem Two  Active   [facility] CM Short Term Goal #1 (0-30 days)  Pt will take his prescribed medications over the next 30 days   [facility] CM Short Term Goal #1 Start Date  10/09/15   Interventions for Short Term Goal #2   Discussed the importance of taking all prescribed medications to prevent acute events and reviewed  all medications with pt understanding.       [name], RN Care Management Coordinator [place] Office [phone]

## 2015-10-09 NOTE — Telephone Encounter (Signed)
Please call back Forest from [facility].

## 2015-10-09 NOTE — Telephone Encounter (Signed)
Spoke with [name], no further needs, she has gotten in touch with patient

## 2015-10-10 ENCOUNTER — Telehealth: Payer: Self-pay

## 2015-10-10 ENCOUNTER — Other Ambulatory Visit: Payer: Self-pay | Admitting: *Deleted

## 2015-10-10 ENCOUNTER — Encounter: Payer: Self-pay | Admitting: *Deleted

## 2015-10-10 NOTE — Patient Outreach (Signed)
[facility]) Care Management   10/10/2015  [name] 18-May-1946 [id number]  [name] is an 70 y.o. male Pt receptive to enrollment today via [facility] for community case management Subjective:  HF: Pt reports daily weights and documents all readings for providers to view. After further education on the HF zones pt verified he is in the GREEN zone with no distressful symptoms at this time. Pt able to read the provide information on the HF zones as discussed today for pt's knowledge base. Pt states he is aware when to contact his providers verse an ED visit when knows if his symptom are severe to go to the emergency room. Pt receptive to the printed material via EMMI and HF zones today.  HOME O2: Pt states he wears his O2 mostly at night but has permission to use during the day if needed for SOB.  MEDICATION: Pt aware of most of his medication however receptive to review and education. Pt aware that he is on a fluid and BP medication. Pt reports he has enough supply on all his medications.  Other items discussed today for social worker to engage involved [employer] meals  NUTRITIONAL: Pt states he eats about 51%-75% of his meals but limited with assistance in preparing all his meals. Pt indicated he did not receive any [employer] meals upon his recent discharge and remains interested in some assistance if available. Pt has tried [employer] in the past.  Objective:   Review of Systems  Constitutional: Negative.   HENT: Negative.   Eyes: Negative.   Respiratory: Negative.   Cardiovascular: Negative.   Gastrointestinal: Negative.   Genitourinary: Negative.   Musculoskeletal: Negative.   Skin: Negative.   Neurological: Negative.   Endo/Heme/Allergies: Negative.   Psychiatric/Behavioral: Negative.     Physical Exam  Constitutional: He is oriented to person, place, and time. He appears well-developed and well-nourished.  HENT:  Right Ear: External ear normal.  Left Ear:  External ear normal.  Eyes: EOM are normal.  Neck: Normal range of motion.  Cardiovascular: Normal heart sounds.   Respiratory: Effort normal and breath sounds normal.  GI: Soft. Bowel sounds are normal.  Musculoskeletal: Normal range of motion.  Neurological: He is alert and oriented to person, place, and time.  Skin: Skin is warm and dry.  Psychiatric: He has a normal mood and affect. His behavior is normal. Judgment and thought content normal.    Encounter Medications:   Outpatient Encounter Prescriptions as of 10/10/2015  Medication Sig  . albuterol (VENTOLIN HFA) 108 (90 BASE) MCG/ACT inhaler Inhale 2 puffs into the lungs every 6 (six) hours as needed for wheezing or shortness of breath.  [name] apixaban (ELIQUIS) 5 MG TABS tablet Take 1 tablet (5 mg total) by mouth 2 (two) times daily.  [name] aspirin 81 MG chewable tablet Chew 1 tablet (81 mg total) by mouth daily.  . bisacodyl (DULCOLAX) 10 MG suppository Place 1 suppository (10 mg total) rectally daily as needed for severe constipation.  [name] buPROPion (WELLBUTRIN) 75 MG tablet Take 1 tablet (75 mg total) by mouth 2 (two) times daily.  . cholecalciferol (VITAMIN D) 1000 units tablet Take 1 tablet (1,000 Units total) by mouth daily.  . Digoxin 62.5 MCG TABS Take 0.0625 mg by mouth daily.  [name] diltiazem (CARDIZEM CD) 120 MG 24 hr capsule Take 1 capsule (120 mg total) by mouth daily.  [name] diltiazem (CARDIZEM) 30 MG tablet Take one tablet by mouth every 4-6 hours as needed  for palpitations and or heart rate >100  . furosemide (LASIX) 40 MG tablet Take 1 tablet (40 mg total) by mouth daily.  . metoprolol succinate (TOPROL-XL) 100 MG 24 hr tablet Take 100 mg by mouth daily. Take with or immediately following a meal.  . mirtazapine (REMERON) 15 MG tablet TAKE 1 TABLET BY MOUTH AT BEDTIME  . Multiple Vitamin (MULTIVITAMIN WITH MINERALS) TABS tablet Take 1 tablet by mouth daily.  [name] omeprazole (PRILOSEC) 40 MG capsule Take 1 capsule (40 mg total) by mouth  daily.  . rosuvastatin (CRESTOR) 20 MG tablet Take 1 tablet (20 mg total) by mouth daily at 6 PM.  . senna-docusate (CVS SENNA PLUS) 8.6-50 MG tablet Take 1 tablet by mouth daily as needed for moderate constipation.  [name] tiotropium (SPIRIVA) 18 MCG inhalation capsule Place 1 capsule (18 mcg total) into inhaler and inhale daily.  . Urea 37.5 % CREA Apply 1 application topically daily as needed (for irritation). (Patient not taking: Reported on 10/09/2015)   No facility-administered encounter medications on file as of 10/10/2015.    Functional Status:   In your present state of health, do you have any difficulty performing the following activities: 10/09/2015 09/27/2015  Hearing? N N  Vision? N N  Difficulty concentrating or making decisions? N N  Walking or climbing stairs? Y Y  Dressing or bathing? Y N  Doing errands, shopping? [name]  Preparing Food and eating ? N -  Using the Toilet? N -  In the past six months, have you accidently leaked urine? Y -  Do you have problems with loss of bowel control? N -  Managing your Medications? Y -  Managing your Finances? Y -  Housekeeping or managing your Housekeeping? Y -   BP 102/60 mmHg  Pulse 76  Resp 20  Ht 1.905 m (6\' 3" )  Wt 172 lb (78.019 kg)  BMI 21.50 kg/m2  SpO2 99% Fall/Depression Screening:    PHQ 2/9 Scores 10/09/2015 09/10/2015 07/28/2015 07/19/2015 06/27/2015 06/16/2015 05/03/2015  PHQ - 2 Score 0 0 4 1 0 3 2  PHQ- 9 Score - - 15 - - 10 8  Exception Documentation - - - - - - -    Assessment:    Introduced [facility] program and services for enrollment Case management related to HF Home O2 Medication review and education Nutritional needs related to meal delivery   Plan:  Will enroll pt into the [facility] program and services and obtain a signed consent form for HF program.  Will continue teaching and education on the HF zones and what to do if acute symptoms are encountered. Will provide printable material and review the HF zones and  following EMMI print outs. HF: Keeping track of your weight each day, HF: When to call your doctor or 911 and HF: How to be salt smart. Will verify pt remains in the GREEN zone after thorough review and pt is aware when to seek medical attention with acute symptoms.  Pt verifies his weight today 172 lbs, yesterday at 171 lbs and last week when hospitalized at 173 lbs with no symptoms experienced since his discharge from the hospital.  Will strongly encouraged pt on the importance of daily weights and dietary measures with a low sodium diet or salt smart to reduce possible fluid retention.  Will verify pt's use of home O2 currently on 3 liters PRN during the day and uses at night as recommended.  Will verify pt's understanding of all her medications and  enough supply is available. Will education pt on the importance of taking his medication at a earlier time (taken today at 1200). Will contact involved social worker Alford Highland. Who is pending a home visit for today to address pt's needs. RN has contact Education officer, museum concerning meal delivery via [place] meals if applicable. Also required on assistance with pt's M[employer] application that has been completed so pt has indicated. Verified pt aware to mention any other resources needed during that home visit with the [facility] social worker.  Plan of care discussed and goals review with pt understanding on how to obtain such goals with daily monitoring of his HF. Dr. [name] will be alerted on pt's involved with [facility] and the goals that are in place. No additional inquires or request at this time as pt with full understanding of today's assessment. Will scheduled the next home visit for ongoing case management services.  [name], RN Care Management Coordinator [place] Office [phone]

## 2015-10-10 NOTE — Telephone Encounter (Signed)
Please call back [name] from [facility].

## 2015-10-10 NOTE — Patient Outreach (Signed)
[facility]) Care Management  10/10/2015  AADISH TOWNSEND February 13, 1946 [id number]   CSW made a second attempt to try and contact patient today to perform phone assessment, as well as assess and assist with social needs and services, without success.  CSW also tried calling patient on his cell phone number, provided to [place] by patient's RNCM, [name], also with [place] Management.  A HIPAA complaint message was left for patient on voicemail.  CSW is currently awaiting a return call. [name], BSW, MSW, LCSW  Licensed Education officer, environmental Health System  Mailing [place] [address] [place], [place] [address] Physical Address-[address], [place], [place] [address] Toll Free Main # [phone] Fax # [phone] Cell # [phone]  Fax # [phone]  [name] [employer]  Discrimination is Against the [employer]. and its subsidiaries comply with applicable Federal civil rights laws and do not discriminate on the basis of race, color, national origin, age, disability, or sex. [place] do not exclude people or treat them differently because of race, color, national origin, age, disability, or sex.    Yahoo. and its subsidiaries provide:  . Free auxiliary aids and services, such as qualified sign language interpreters, video remote interpretation, and written information in other formats to people with disabilities when such auxiliary aids and services are necessary to ensure an equal opportunity to participate. . Free language services to people whose primary language is not English when those services are necessary to provide meaningful access, such as translated documents or oral interpretation.    If you need these services, call [phone] or if you use a TTY, call 711.   If you believe that Yahoo. and its subsidiaries have  failed to provide these services or discriminated in another way on the basis of race, color, national origin, age, disability, or sex, you can file a Tourist information centre manager with:   Discrimination Grievances  [place] [address]   If you need help filing a grievance, call [phone] or if you use a TTY, call 711.  You can also file a civil rights complaint with the U.S. Department of Health and Financial controller, Office for Civil Rights electronically through the Office for Civil Rights Complaint Portal, available at OnSiteLending.nl.jsf, or by mail or phone at:   [place]. Department of Health and Human Services  [place], [place], [facility]  [place], [place]  [phone], [phone] (TDD)  Complaint forms are available at CutFunds.[email or web address]             [place]: ATTENTION: If you do not speak English, language assistance services, free of charge, are available to you. Call [phone]  (TTY: R9478181).  Espaol (Spanish): ATENCIN: si habla espaol, tiene a su disposicin servicios gratuitos de asistencia lingstica. Llame al [phone] (TTY: [id number]).  ???? (Chinese): ?????????????????????????????? [phone] (TTY:711??  Ti?ng Vi?t (Vietnamese): CH : N?u b?n ni Ti?ng Vi?t, c cc d?ch v? h? tr? ngn ng? mi?n ph dnh cho b?n. G?i s? [phone] (TTY: R9478181).  ??? ([place]): ?? : ???? ????? ?? , ?? ?? ???? ??? ???? ? ???? . [phone] (TTY: 711)??? ??? ???? .  Tagalog (Tagalog - Filipino): PAUNAWA: Kung nagsasalita ka ng Tagalog, maaari kang gumamit ng mga serbisyo ng tulong sa wika nang walang bayad. [place] sa [phone] (TTY: R9478181).   ([place]): :      ,      .  [phone] (: [id number]).  Kreyl Ayisyen ([place]): ATANSYON:  Si w pale Ethelene Hal, gen svis d pou lang ki disponib gratis pou ou. Rele [phone] (TTY: R9478181).  Fonnie Jarvis Marland KitchenPakistan): ATTENTION : Si vous parlez franais, des services d'aide linguistique vous sont proposs gratuitement. Appelez le [phone] (ATS : [id number]).  Polski (Polish): UWAGA: Jeeli mwisz po polsku, moesz skorzysta z bezpatnej pomocy jzykowej. Zadzwo pod numer [phone] (TTY: [id number]).  Portugus ([place]): ATENO: Se fala portugus, encontram-se disponveis servios lingusticos, grtis. Ligue para [phone] (TTY: R9478181).   Italiano ([place]): ATTENZIONE: In caso la lingua parlata sia l'italiano, sono disponibili servizi di assistenza linguistica gratuiti. Chiamare il numero [phone] (TTY: [id number]).  Dawayne Patricia ([place]): ACHTUNG: Wenn Sie Deutsch sprechen, stehen Ihnen kostenlos sprachliche Hilfsdienstleistungen zur [employer]. Rufnummer: [phone] (TTY: [id number]).   (Arabic): [phone]   .            : .)[id number] :   (  ??? ([place]): ??????????????????????????????????[phone] ?TTY?711?????????????????  ? (Farsi): [phone]  . ?   ? ?  ? ? ?~ ?  ?    : .??  (TTY: 711)  Din Bizaad ([place]): D77 baa ak0 n7n7zin: D77 saad bee y1n7[ti'go Risa Grill, saad bee 1k1'1n7da'1wo'd66', t'11 Pricilla Loveless n1 h0l=, koj8' h0d77lnih [phone] (TTY: [id number]).

## 2015-10-10 NOTE — Telephone Encounter (Signed)
VO for OT 1x/week x 4week for ADL training and home exercise for strength/ROM   VO Ok?

## 2015-10-11 ENCOUNTER — Encounter: Payer: Self-pay | Admitting: Internal Medicine

## 2015-10-11 ENCOUNTER — Ambulatory Visit (INDEPENDENT_AMBULATORY_CARE_PROVIDER_SITE_OTHER): Payer: Commercial Managed Care - HMO | Admitting: Internal Medicine

## 2015-10-11 ENCOUNTER — Ambulatory Visit (HOSPITAL_COMMUNITY)
Admission: RE | Admit: 2015-10-11 | Discharge: 2015-10-11 | Disposition: A | Discharge status: Home / Self Care | Payer: Commercial Managed Care - HMO | Source: Ambulatory Visit | Attending: Student | Admitting: Student

## 2015-10-11 ENCOUNTER — Other Ambulatory Visit: Payer: Self-pay | Admitting: *Deleted

## 2015-10-11 VITALS — BP 100/63 | HR 54 | Temp 97.6°F | Ht 75.0 in | Wt 158.9 lb

## 2015-10-11 DIAGNOSIS — J90 Pleural effusion, not elsewhere classified: Secondary | ICD-10-CM

## 2015-10-11 DIAGNOSIS — I4891 Unspecified atrial fibrillation: Secondary | ICD-10-CM | POA: Insufficient documentation

## 2015-10-11 DIAGNOSIS — Z950 Presence of cardiac pacemaker: Secondary | ICD-10-CM

## 2015-10-11 DIAGNOSIS — I4811 Longstanding persistent atrial fibrillation: Secondary | ICD-10-CM

## 2015-10-11 DIAGNOSIS — I481 Persistent atrial fibrillation: Secondary | ICD-10-CM

## 2015-10-11 DIAGNOSIS — I5042 Chronic combined systolic (congestive) and diastolic (congestive) heart failure: Secondary | ICD-10-CM | POA: Diagnosis not present

## 2015-10-11 DIAGNOSIS — G8929 Other chronic pain: Secondary | ICD-10-CM

## 2015-10-11 LAB — BASIC METABOLIC PANEL
ANION GAP: 8 (ref 5–15)
BUN: 25 mg/dL — ABNORMAL HIGH (ref 6–20)
CALCIUM: 9.7 mg/dL (ref 8.9–10.3)
CO2: 30 mmol/L (ref 22–32)
CREATININE: 2.04 mg/dL — AB (ref 0.61–1.24)
Chloride: 104 mmol/L (ref 101–111)
GFR, EST AFRICAN AMERICAN: 36 mL/min — AB (ref >60)
GFR, EST NON AFRICAN AMERICAN: 31 mL/min — AB (ref >60)
Glucose, Bld: 114 mg/dL — ABNORMAL HIGH (ref 65–99)
Potassium: 4.9 mmol/L (ref 3.5–5.1)
SODIUM: 142 mmol/L (ref 135–145)

## 2015-10-11 LAB — MAGNESIUM: MAGNESIUM: 1.7 mg/dL (ref 1.7–2.4)

## 2015-10-11 MED ORDER — OXYCODONE-ACETAMINOPHEN 10-325 MG PO TABS: 1.0000 | ORAL_TABLET | ORAL | Status: DC | PRN

## 2015-10-11 NOTE — Assessment & Plan Note (Signed)
Assessment Fluid analysis in his most recent hospitalization suggested transudate of process. Cytology only showed reactive mesothelial cells. 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 He confirms follow-up with pulmonology next month for further evaluation. He is requesting a nebulizer machine as well as an oxygen conservation device.  Plan -Check with [facility] about nebulizer machine and oxygen conservation device

## 2015-10-11 NOTE — Assessment & Plan Note (Addendum)
Assessment He has brought his medications with him today which include metoprolol XL 100 mg and diltiazem XR 120 mg. He also says he has a prescription for diltiazem 30 mg to be taken 1 tablet every 4-6 hours though has not been taking this medication, and it appears there is a duplicate prescription in our system. His heart rate in the office is 54 and was 44 on manual pulse check. He also noticed symptoms of headache, lightheadedness, not feeling well though had not eaten breakfast this morning.  Plan -Check EKG to ensure pacemaker function -Discontinue diltiazem 30 mg to be taken 1 tablet every 4-6 hours  ADDENDUM 10/11/2015  3:10 PM:  EKG notable for irregular rate though not bradycardic, normal rhythm, normal axis.

## 2015-10-11 NOTE — Telephone Encounter (Signed)
Yes that is fine, thank you

## 2015-10-11 NOTE — Patient Outreach (Signed)
[place] [facility]) Care Management  10/11/2015  [name] 01/08/1946 [id number]   Patient's RNCM with [name] Management, [name] was able to meet with patient yesterday (Tuesday, May 23rd) to perform the initial home visit.  CSW was scheduled to meet with patient for the initial home visit at 12:30pm, on that same day; however, had to cancel the visit with patient due to a crisis situation with another client. CSW attempted to contact patient to explain CSW's dilemma, as well as reschedule the appointment, but patient was unavailable.  CSW left a HIPAA compliant message for patient yesterday, and then again today.  CSW is currently awaiting a return call. In the meantime, CSW received a request from Ms. [name] to arrange the [facility] Well-Dine Meal Program for patient, as patient was recently discharged from [facility] and definitely qualifies for services.  Ms. [name] also wanted to know if patient would be eligible to receive the Transition Meal Program through [employer] with [employer] [facility] [place], at the expense of [employer], for an additional 30 days.  This would entitle patient to 60 days worth of meals, at no expense.  CSW has submitted the above requests to [name], Care Management Assistant with [place] Management, as [name] will make all the appropriate arrangements. [name], BSW, MSW, LCSW  Licensed Education officer, environmental Health System  Mailing [place] [address] [place], [place] [address] Physical Address-[address], [place], [place] [address] Toll Free Main # [phone] Fax # [phone] Cell # [phone]  Fax # [phone]  [name] [employer]  Discrimination is Against the [employer]. and its subsidiaries comply with applicable Federal civil rights laws and  do not discriminate on the basis of race, color, national origin, age, disability, or sex. [place] do not exclude people or treat them differently because of race, color, national origin, age, disability, or sex.    Yahoo. and its subsidiaries provide:  . Free auxiliary aids and services, such as qualified sign language interpreters, video remote interpretation, and written information in other formats to people with disabilities when such auxiliary aids and services are necessary to ensure an equal opportunity to participate. . Free language services to people whose primary language is not English when those services are necessary to provide meaningful access, such as translated documents or oral interpretation.    If you need these services, call [phone] or if you use a TTY, call 711.   If you believe that Yahoo. and its subsidiaries have failed to provide these services or discriminated in another way on the basis of race, color, national origin, age, disability, or sex, you can file a Tourist information centre manager with:   Discrimination [place] [address]   If you need help filing a grievance, call [phone] or if you use a TTY, call 711.  You can also file a civil rights complaint with the U.S. Department of Health and Financial controller, Office for Civil Rights electronically through the Office for Civil Rights Complaint Portal, available at OnSiteLending.nl.jsf, or by mail or phone at:   [place]. Department of Health and Human Services  [address] [place]  Room [phone], [facility] Building  [place], [place]  [phone], [phone] (TDD)  Complaint forms are available at CutFunds.si  [employer] Multi-Language Interpreter Services  English: ATTENTION: If you do not speak English, language assistance services, free of charge, are available to you.  Call [phone]  (TTY: [id number]).  Espaol (Spanish): ATENCIN: si habla espaol, tiene a su disposicin servicios gratuitos de asistencia lingstica. Llame al [phone] (TTY: [id number]).  ???? (Chinese): ?????????????????????????????? [phone] (TTY:711??  Ti?ng Vi?t (Vietnamese): CH : N?u b?n ni Ti?ng Vi?t, c cc d?ch v? h? tr? ngn ng? mi?n ph dnh cho b?n. G?i s? [phone] (TTY: [id number]).  ??? ([place]): ?? : ???? ????? ?? , ?? ?? ???? ??? ???? ? ???? . [phone] (TTY: 711)??? ??? ???? .  Tagalog (Tagalog - Filipino): PAUNAWA: Kung nagsasalita ka ng Tagalog, maaari kang gumamit ng mga serbisyo ng tulong sa wika nang walang bayad. [place] sa [phone] (TTY: [id number]).   [place]): :      ,      .  [phone] (: [id number]).  Kreyl Ayisyen ([place]): ATANSYON: Si w pale Ethelene Hal, gen svis d pou lang ki disponib gratis pou ou. Rele [phone] (TTY: [id number]).  Fonnie Jarvis Marland KitchenPakistan): ATTENTION : Si vous parlez franais, des services d'aide linguistique vous sont proposs gratuitement. Appelez le [phone] (ATS : [id number]).  Polski (Polish): UWAGA: Jeeli mwisz po polsku, moesz skorzysta z bezpatnej pomocy jzykowej. Zadzwo pod numer [phone] (TTY: [id number]).  Portugus ([place]): ATENO: Se fala portugus, encontram-se disponveis servios lingusticos, grtis. Ligue para [phone] (TTY: [id number]).   Italiano ([place]): ATTENZIONE: In caso la lingua parlata sia l'italiano, sono disponibili servizi di assistenza linguistica gratuiti. Chiamare il numero [phone] (TTY: [id number]).  Dawayne Patricia ([place]): ACHTUNG: Wenn Sie Deutsch sprechen, stehen Ihnen kostenlos sprachliche Hilfsdienstleistungen zur [employer]. Rufnummer: [phone] (TTY: [id number]).   (Arabic): [phone]   .             : .)[id number] :   (  ??? ([place]): ??????????????????????????????????[phone] ?TTY?711?????????????????  ? (Farsi): [phone]  . ?   ? ?  ? ? ?~ ?  ?    : .??  (TTY: 711)  Din Bizaad ([place]): D77 baa ak0 n7n7zin: D77 saad bee y1n7[ti'go Risa Grill, saad bee 1k1'1n7da'1wo'd66', t'11 Pricilla Loveless n1 h0l=, koj8' h0d77lnih [phone] (TTY: [id number]).

## 2015-10-11 NOTE — Progress Notes (Signed)
   Subjective:    Patient ID: [name], male    DOB: 01/25/1946, 70 y.o.   MRN: [id number]  HPI [name] is a 70 year old male who presents today for follow-up for congestive heart failure. Please see assessment & plan for status of chronic medical problems.    Review of Systems  Constitutional: Negative for activity change and unexpected weight change.  Respiratory: Positive for shortness of breath.   Cardiovascular: Negative for chest pain.  Musculoskeletal:       Intermittent jerks  Neurological: Positive for dizziness.       Objective:   Physical Exam  Constitutional: He is oriented to person, place, and time. He appears well-developed. No distress.  HENT:  Head: Normocephalic and atraumatic.  Eyes: Conjunctivae are normal. No scleral icterus.  Neck: No JVD present.  Cardiovascular:  Irregular rate, bradycardic?  Pulmonary/Chest: Effort normal and breath sounds normal.  Neurological: He is alert and oriented to person, place, and time.  Skin: He is not diaphoretic.          Assessment & Plan:

## 2015-10-11 NOTE — Patient Instructions (Addendum)
Do NOT take digoxin.   STOP Lasix and ONLY take your weight goes up 3lbs or 5lbs before you see Dr. Rayann Heman.

## 2015-10-11 NOTE — Assessment & Plan Note (Signed)
Assessment He is requesting refill of Percocet 10/325 mg tablets to be taken every 4 hours as needed 150 tablets. Per review of the [place], he was prescribed Percocet 5/325 mg tablets to be taken 1 tablet every 6 hours as needed 120 tablets which he reports he no longer has. He confirms he was in a rehab facility but was not given a prescription upon leaving.  Plan -Refilled Percocet 10/325 mg tablets to be taken every 4 hours as needed 150 tablets per pain contract in our system with plan to follow-up with PCP in 1-2 months

## 2015-10-11 NOTE — Assessment & Plan Note (Signed)
Assessment Since he has been out of the hospital, he reports taking Lasix 40 mg daily. He does report some dizziness and not feeling well this morning though has not had breakfast. He cannot report to me what his "dry weight" is though knows his weight was up prior to admission. His weight in office today is 158 pounds, down from 168 pounds on discharge He also knows that should his weight increased by 3 pounds in 1 day or 5 pounds in 1 week, he needs to call his doctors to determine what next to do. He is due for repeat lab work today [name]. Though digoxin 0.0625 mg is noted on his list, he does not have it with him today as it was refilled several days ago.  Plan -Check BMET and magnesium today  ADDENDUM 10/11/2015  2:56 PM:  Creatinine elevated to 2.0 from 1.4, 10 days ago. BUN also increased. Magnesium 1.7, reassuring.  These lab abnormalities are consistent with overdiuresis. I told him to hold Lasix 40 mg daily unless he noted his weight increased by 3 pounds in 1 day or more than 5 pounds before his follow-up with cardiology in 7 days to which he acknowledged understanding.   I also told him to hold digoxin until he can follow-up next week to avoid toxicity.

## 2015-10-11 NOTE — Progress Notes (Signed)
Case discussed with Dr. [name] at the time of the visit. We reviewed the resident's history and exam and pertinent patient test results. I agree with the assessment, diagnosis, and plan of care documented in the resident's note with the following exception:  ECG revealed an irregularly irregular rhythm with a normal rate.  No pacer spikes were seen (rate likely above pacing rate).

## 2015-10-12 NOTE — Patient Outreach (Signed)
[place] [facility]) Care Management  10/12/2015  [name] 1945-11-13 [id number]   Request received from [name], [place] to refer patient for [facility] Well Dine program in addition to [place] Well Dine meals (10 days worth of meals) will be delivered on 10/20/15 to patient's address. Waiting on confirmation for start date of Mobile Meals 30 days of transition meals.    [name]  [facility] Care Management Assistant

## 2015-10-13 DIAGNOSIS — I739 Peripheral vascular disease, unspecified: Secondary | ICD-10-CM | POA: Diagnosis not present

## 2015-10-13 DIAGNOSIS — N189 Chronic kidney disease, unspecified: Secondary | ICD-10-CM | POA: Diagnosis not present

## 2015-10-13 DIAGNOSIS — R918 Other nonspecific abnormal finding of lung field: Secondary | ICD-10-CM | POA: Diagnosis not present

## 2015-10-13 DIAGNOSIS — F329 Major depressive disorder, single episode, unspecified: Secondary | ICD-10-CM | POA: Diagnosis not present

## 2015-10-13 DIAGNOSIS — F319 Bipolar disorder, unspecified: Secondary | ICD-10-CM | POA: Diagnosis not present

## 2015-10-13 DIAGNOSIS — I5022 Chronic systolic (congestive) heart failure: Secondary | ICD-10-CM | POA: Diagnosis not present

## 2015-10-13 DIAGNOSIS — J449 Chronic obstructive pulmonary disease, unspecified: Secondary | ICD-10-CM | POA: Diagnosis not present

## 2015-10-13 DIAGNOSIS — I130 Hypertensive heart and chronic kidney disease with heart failure and stage 1 through stage 4 chronic kidney disease, or unspecified chronic kidney disease: Secondary | ICD-10-CM | POA: Diagnosis not present

## 2015-10-13 DIAGNOSIS — I4891 Unspecified atrial fibrillation: Secondary | ICD-10-CM | POA: Diagnosis not present

## 2015-10-17 ENCOUNTER — Other Ambulatory Visit: Payer: Self-pay | Admitting: *Deleted

## 2015-10-17 DIAGNOSIS — I739 Peripheral vascular disease, unspecified: Secondary | ICD-10-CM | POA: Diagnosis not present

## 2015-10-17 DIAGNOSIS — I130 Hypertensive heart and chronic kidney disease with heart failure and stage 1 through stage 4 chronic kidney disease, or unspecified chronic kidney disease: Secondary | ICD-10-CM | POA: Diagnosis not present

## 2015-10-17 DIAGNOSIS — I5022 Chronic systolic (congestive) heart failure: Secondary | ICD-10-CM | POA: Diagnosis not present

## 2015-10-17 DIAGNOSIS — F329 Major depressive disorder, single episode, unspecified: Secondary | ICD-10-CM | POA: Diagnosis not present

## 2015-10-17 DIAGNOSIS — J449 Chronic obstructive pulmonary disease, unspecified: Secondary | ICD-10-CM | POA: Diagnosis not present

## 2015-10-17 DIAGNOSIS — I4891 Unspecified atrial fibrillation: Secondary | ICD-10-CM | POA: Diagnosis not present

## 2015-10-17 DIAGNOSIS — N189 Chronic kidney disease, unspecified: Secondary | ICD-10-CM | POA: Diagnosis not present

## 2015-10-17 DIAGNOSIS — R918 Other nonspecific abnormal finding of lung field: Secondary | ICD-10-CM | POA: Diagnosis not present

## 2015-10-17 DIAGNOSIS — F319 Bipolar disorder, unspecified: Secondary | ICD-10-CM | POA: Diagnosis not present

## 2015-10-17 NOTE — Patient Outreach (Signed)
[place] [facility]) Care Management  10/17/2015  JOHNTRELL KURT 30-Apr-1946 [id number]  Transition of care  RN attempted outreach call to pt however unsuccessful. RN able to leave a HIPAA approved voice message. Will further inquired on pt's ongoing management of care.  [name], RN Care Management Coordinator [place] Office [phone]

## 2015-10-18 ENCOUNTER — Encounter: Payer: Commercial Managed Care - HMO | Admitting: Internal Medicine

## 2015-10-18 NOTE — Patient Outreach (Signed)
[place] [facility]) Care Management  10/18/2015  [name] [birth year] [id number]   Confirmation that referral made to Mobile Meals was received today. [employer]/Mobile Meals reaching out to patient today to discuss meal delivery.    [name]  [facility] Care Management Assistant

## 2015-10-19 DIAGNOSIS — R918 Other nonspecific abnormal finding of lung field: Secondary | ICD-10-CM | POA: Diagnosis not present

## 2015-10-19 DIAGNOSIS — F319 Bipolar disorder, unspecified: Secondary | ICD-10-CM | POA: Diagnosis not present

## 2015-10-19 DIAGNOSIS — I130 Hypertensive heart and chronic kidney disease with heart failure and stage 1 through stage 4 chronic kidney disease, or unspecified chronic kidney disease: Secondary | ICD-10-CM | POA: Diagnosis not present

## 2015-10-19 DIAGNOSIS — I4891 Unspecified atrial fibrillation: Secondary | ICD-10-CM | POA: Diagnosis not present

## 2015-10-19 DIAGNOSIS — N189 Chronic kidney disease, unspecified: Secondary | ICD-10-CM | POA: Diagnosis not present

## 2015-10-19 DIAGNOSIS — I5022 Chronic systolic (congestive) heart failure: Secondary | ICD-10-CM | POA: Diagnosis not present

## 2015-10-19 DIAGNOSIS — I739 Peripheral vascular disease, unspecified: Secondary | ICD-10-CM | POA: Diagnosis not present

## 2015-10-19 DIAGNOSIS — J449 Chronic obstructive pulmonary disease, unspecified: Secondary | ICD-10-CM | POA: Diagnosis not present

## 2015-10-19 DIAGNOSIS — F329 Major depressive disorder, single episode, unspecified: Secondary | ICD-10-CM | POA: Diagnosis not present

## 2015-10-19 NOTE — Telephone Encounter (Signed)
VO given.

## 2015-10-20 DIAGNOSIS — J129 Viral pneumonia, unspecified: Secondary | ICD-10-CM | POA: Diagnosis not present

## 2015-10-20 DIAGNOSIS — N19 Unspecified kidney failure: Secondary | ICD-10-CM | POA: Diagnosis not present

## 2015-10-20 DIAGNOSIS — J449 Chronic obstructive pulmonary disease, unspecified: Secondary | ICD-10-CM | POA: Diagnosis not present

## 2015-10-20 DIAGNOSIS — I4891 Unspecified atrial fibrillation: Secondary | ICD-10-CM | POA: Diagnosis not present

## 2015-10-20 IMAGING — CR DG HIP (WITH OR WITHOUT PELVIS) 2-3V*R*
3 series · 3 of 3 positions shown · non-contrast
Comparison: Plain films 08/13/2013

CLINICAL DATA: Fall, right-sided pain

EXAM:
RIGHT HIP (WITH PELVIS) 2-3 VIEWS

[pelvis ap]
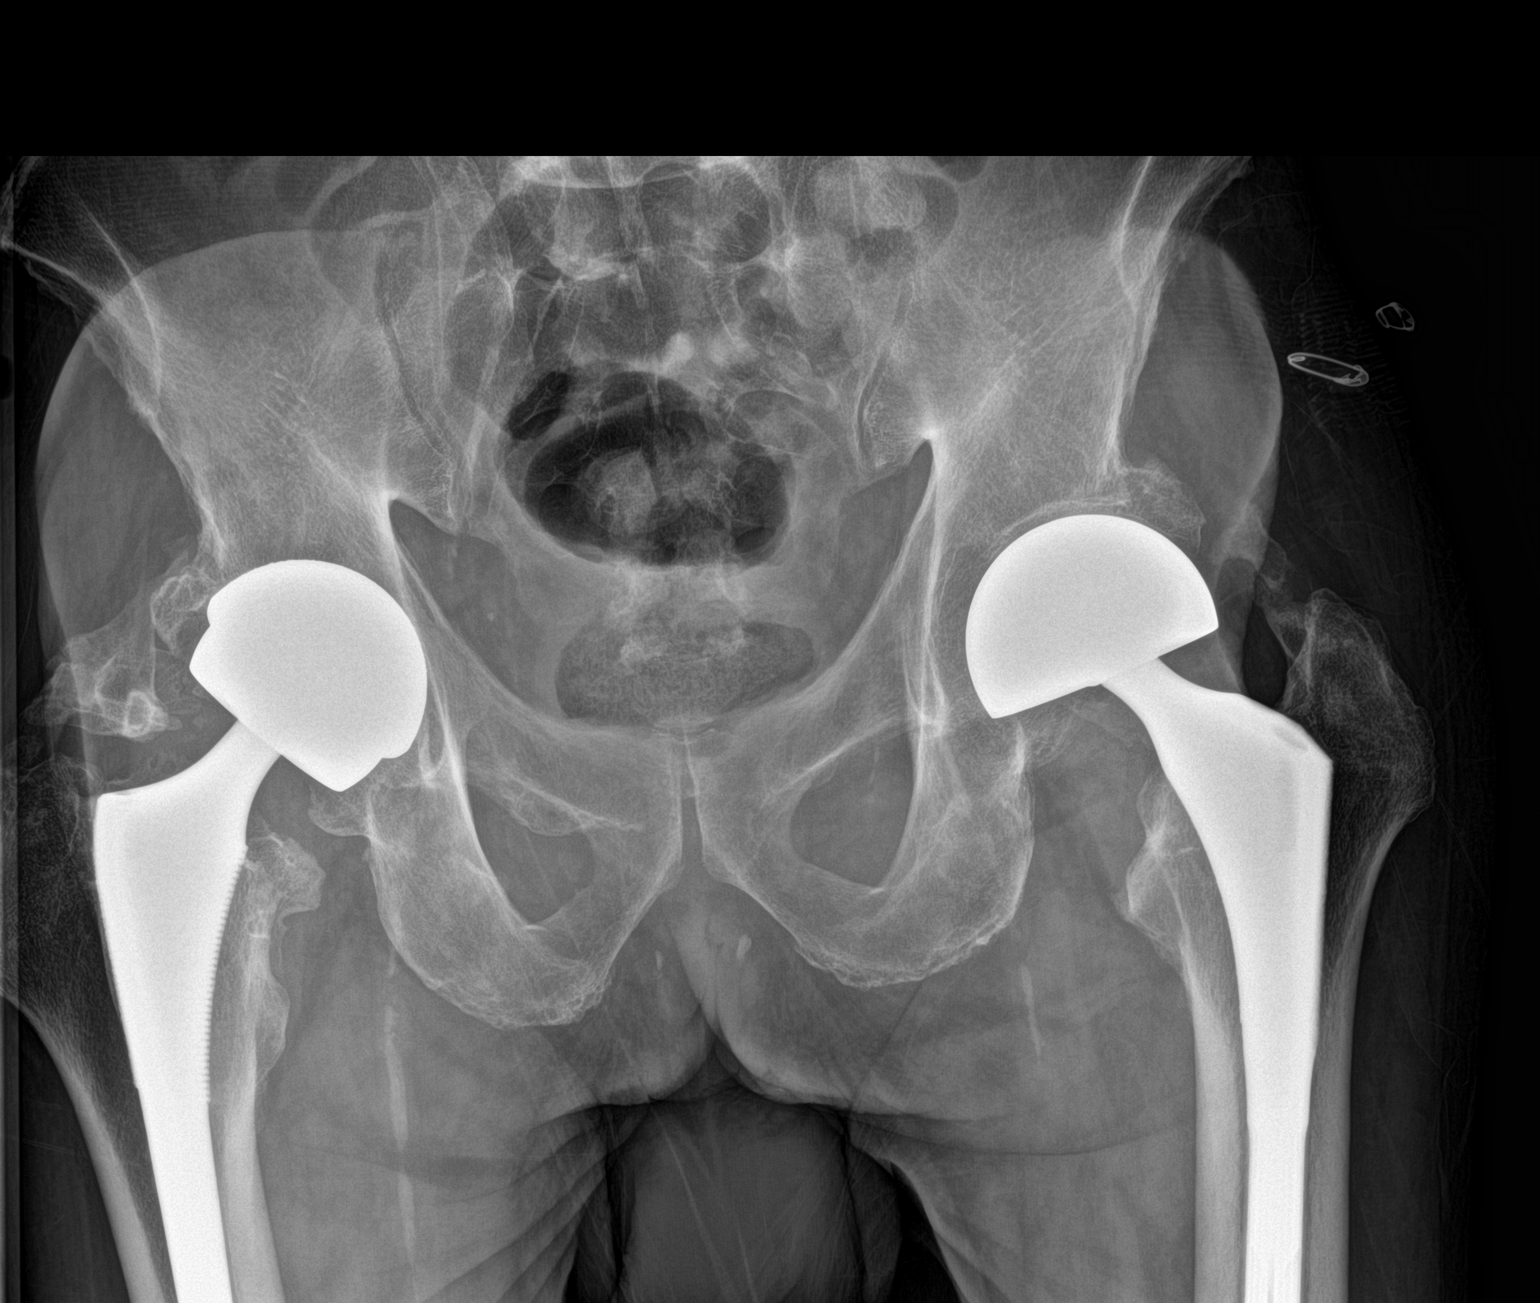

[hip ap]
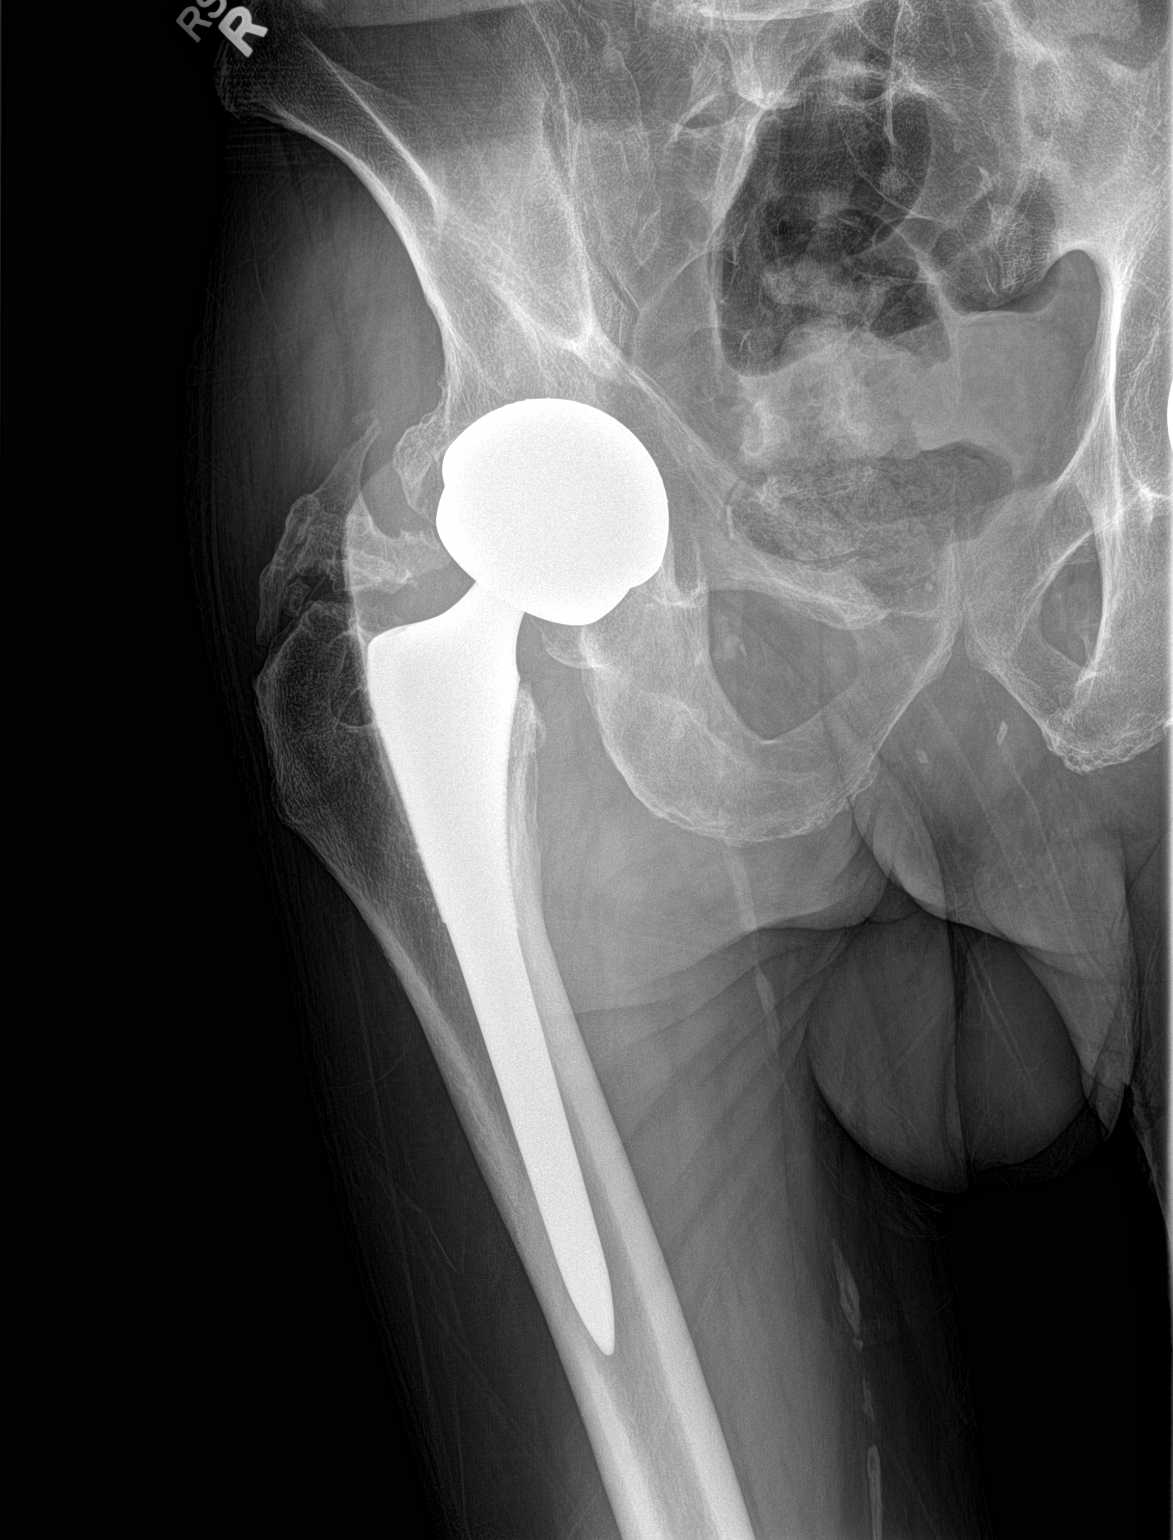

[hip lat]
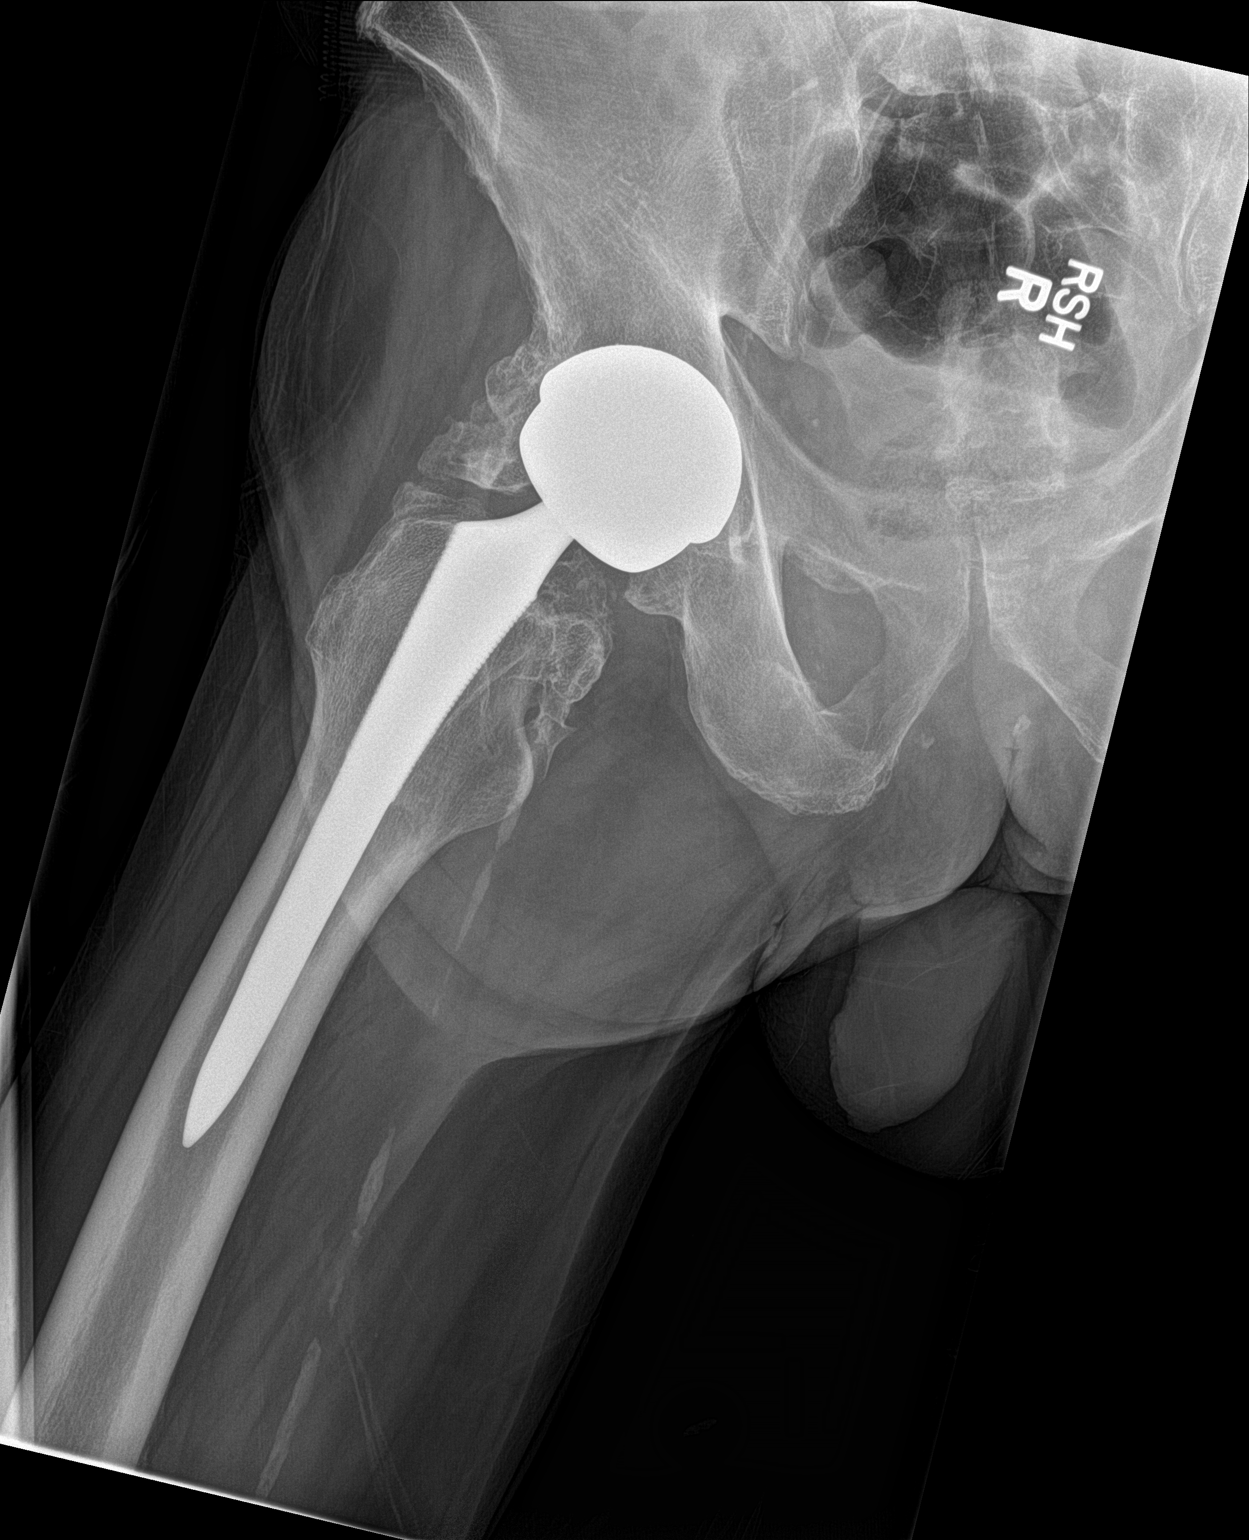

[3 of 3 positions shown; findings below may reference images not displayed]

FINDINGS: Bilateral total hip arthroplasty. The acetabular components are in
the same orientation as comparison exam. No fracture or dislocation.
There is exuberant heterotopic ossification superior to the left and
right greater trochanters but greater on the right. This is not
changed from prior.
IMPRESSION: 1. No fracture dislocation.
2. Bilateral hip arthroplasties.
3. Extensive heterotopic ossification unchanged

## 2015-10-20 IMAGING — CR DG SHOULDER 2+V*R*
3 series · 3 of 3 positions shown · non-contrast
Comparison: Chest radiograph 05/27/2013

CLINICAL DATA: Fell yesterday.  Pain on right side.

EXAM:
RIGHT SHOULDER - 2+ VIEW

[shoulder grashey]
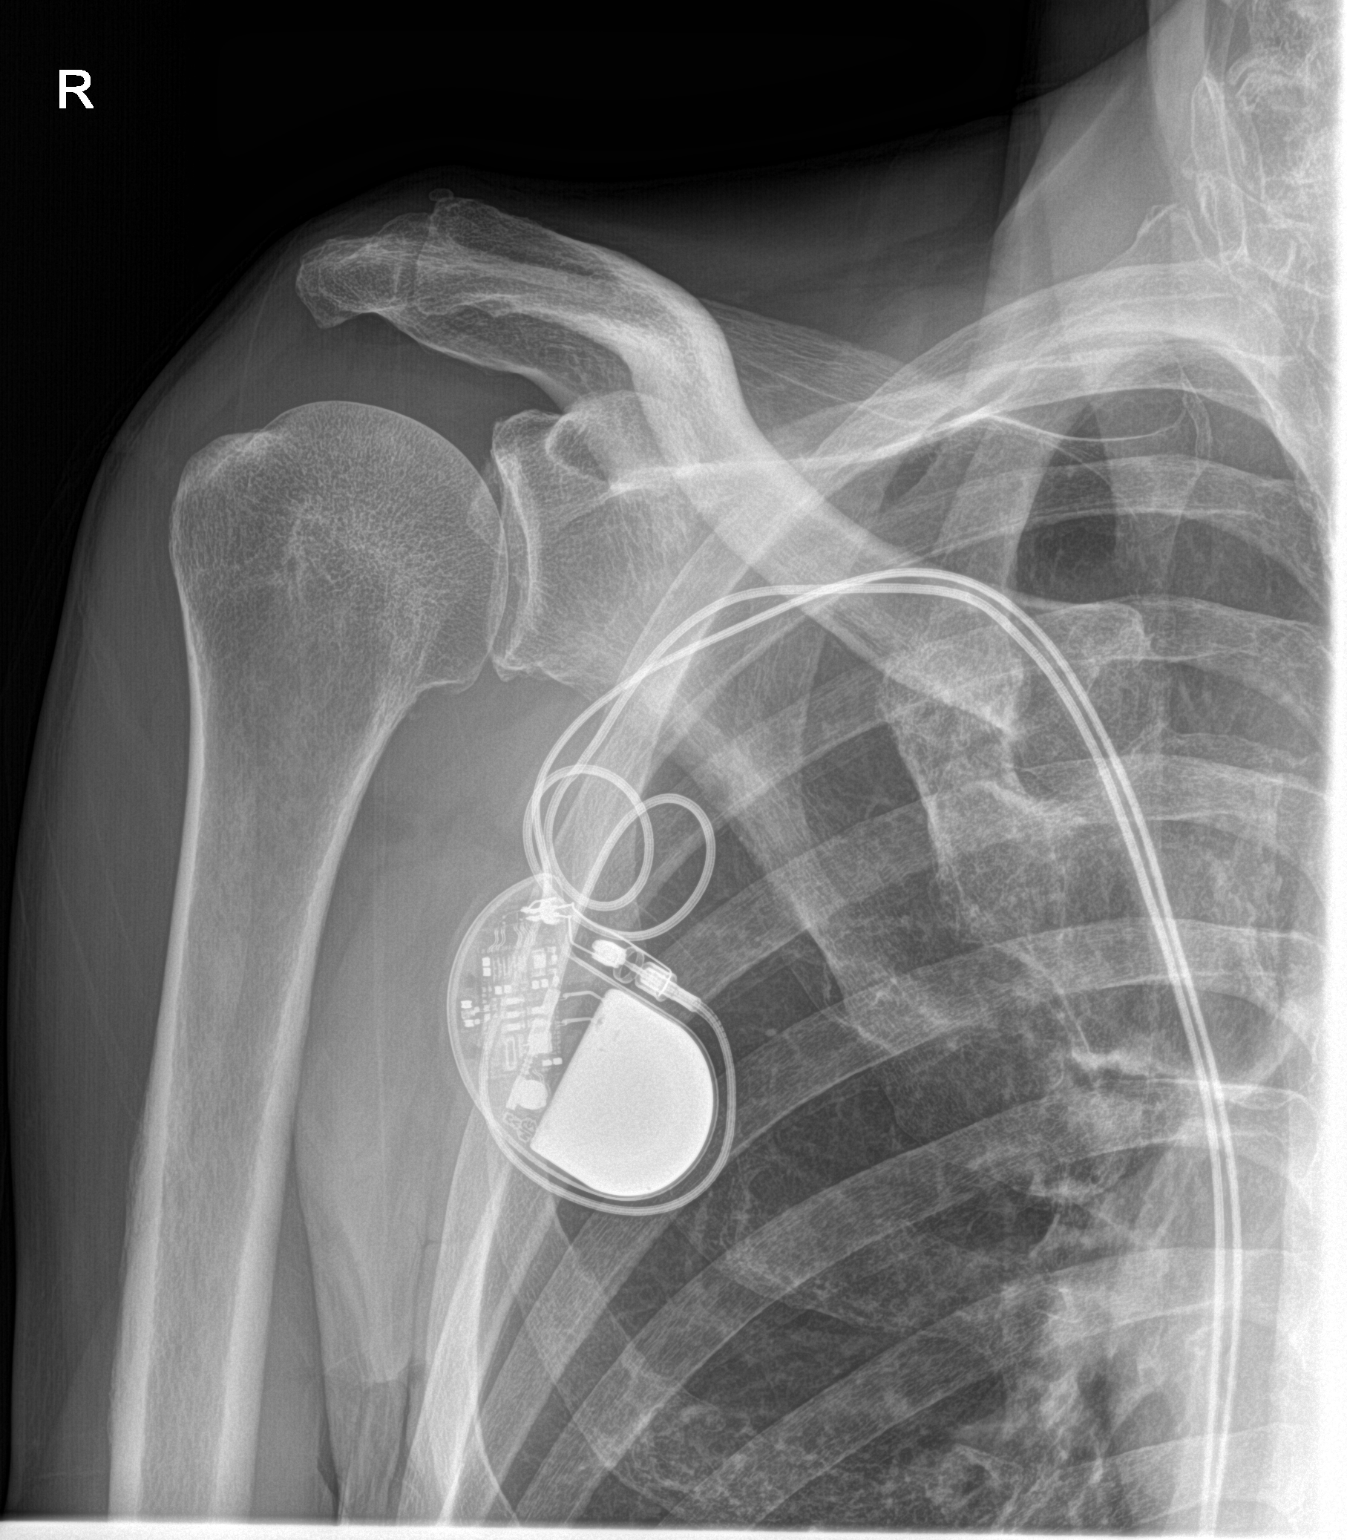

[shoulder y view]
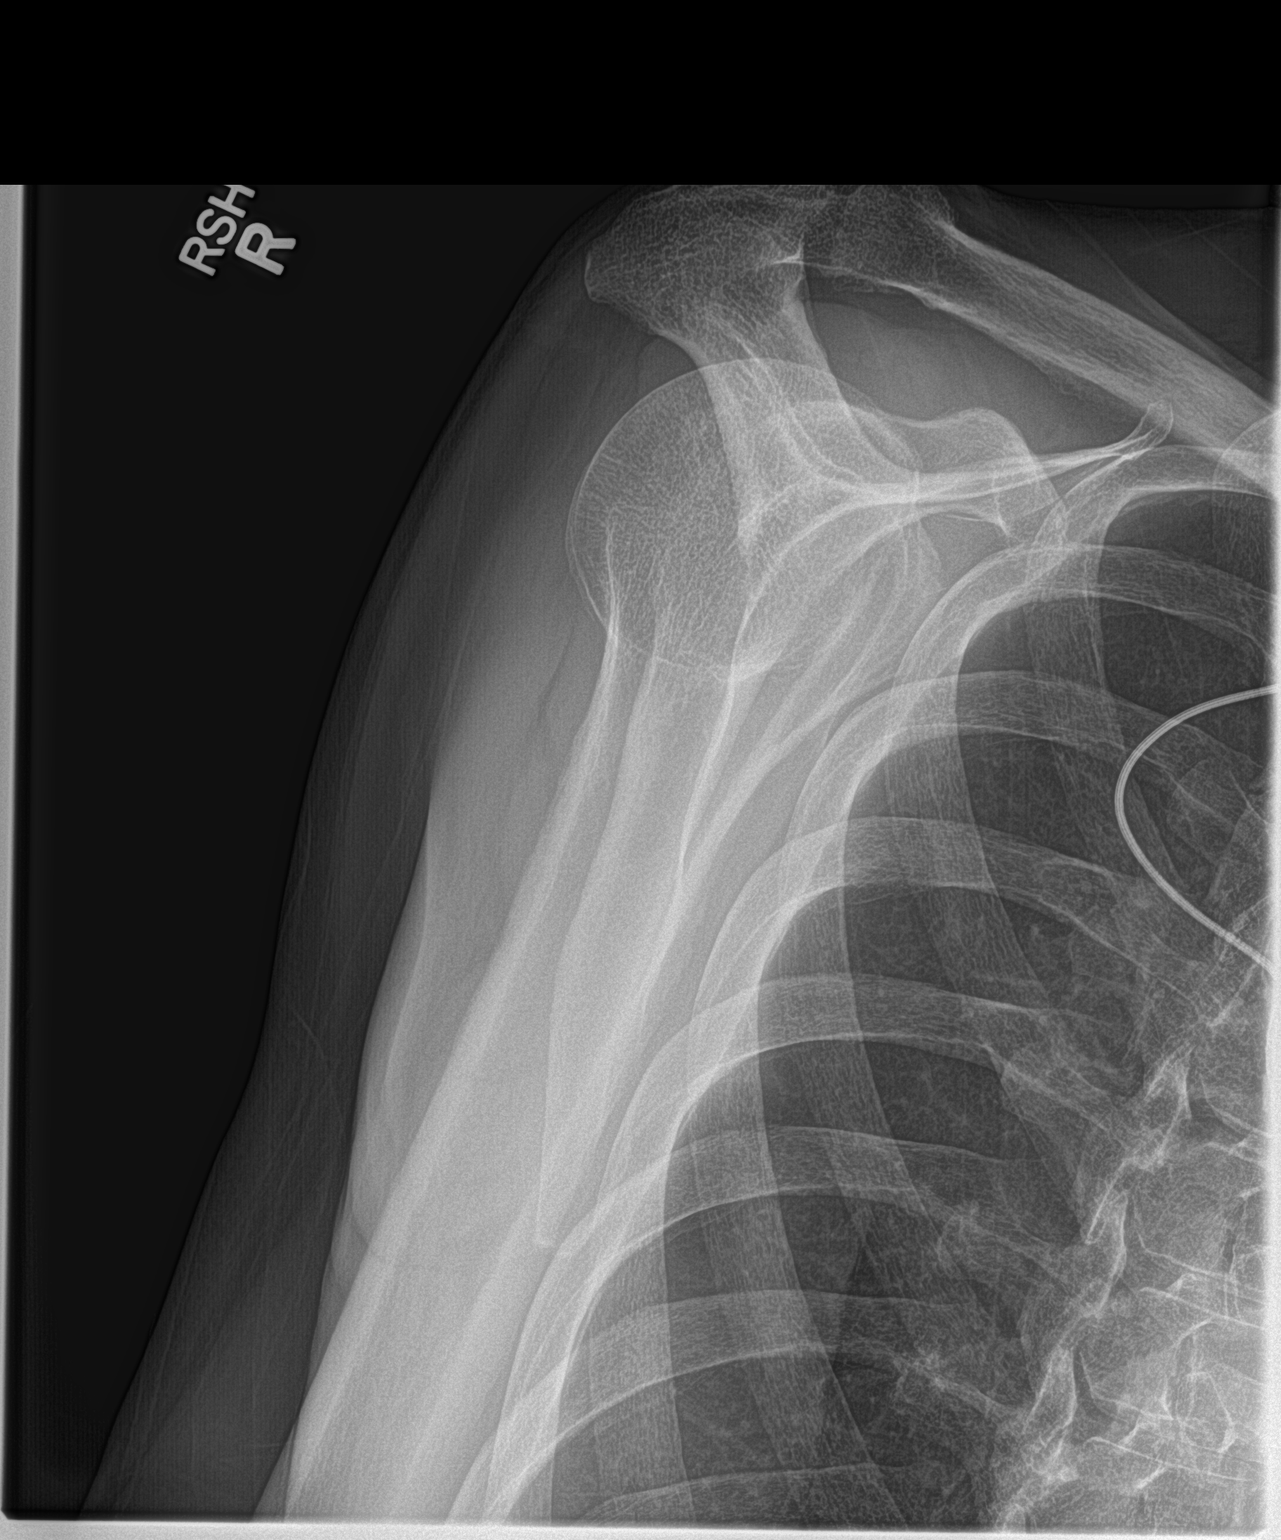

[shoulder axillary]
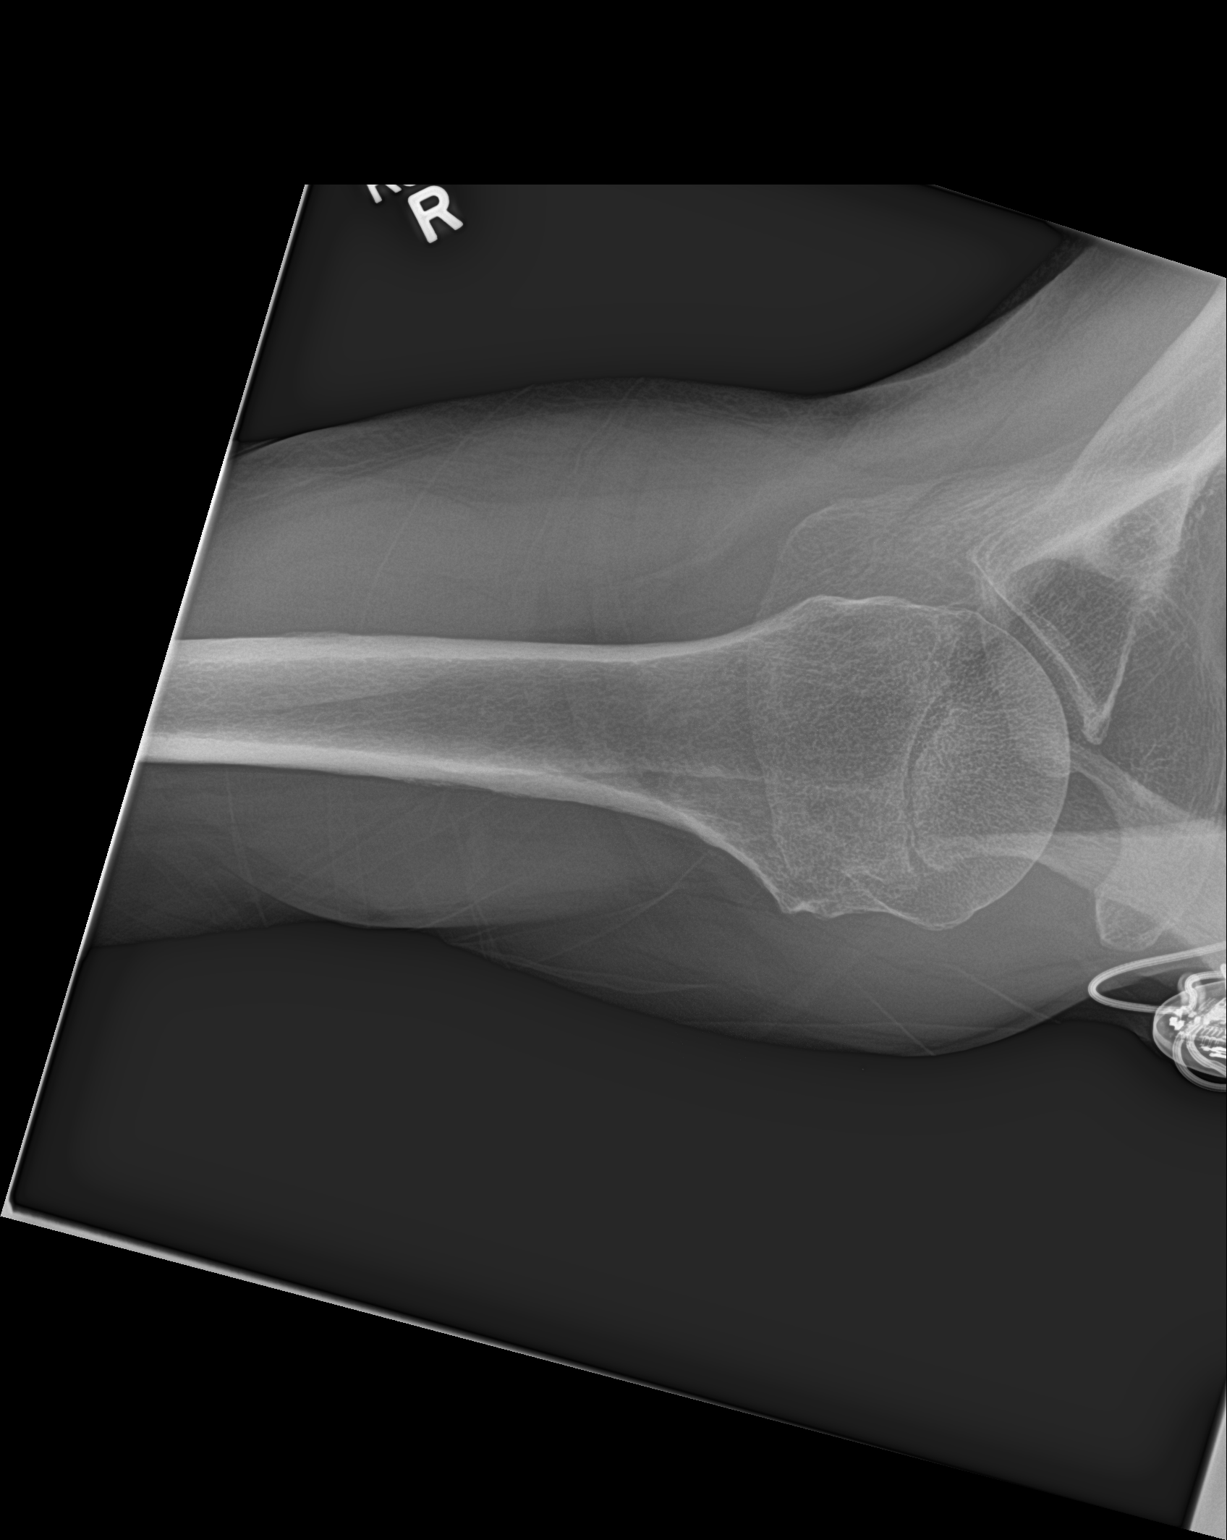

[3 of 3 positions shown; findings below may reference images not displayed]

FINDINGS: Glenohumeral joint is intact. No evidence of scapular fracture or
humeral fracture. The acromioclavicular joint is intact.
IMPRESSION: No fracture or dislocation.

## 2015-10-23 ENCOUNTER — Other Ambulatory Visit: Payer: Self-pay | Admitting: *Deleted

## 2015-10-23 NOTE — Patient Outreach (Signed)
[place] [facility]) Care Management  10/23/2015  [name] 06-16-1945 [id number]   Transition of care  RN attempted outreach call to pt today however unsuccessful. RN able to leave HIPAA approved voice message requesting a call back. Will continue to address pt's ongoing needs related to her HF and follow up accordingly with the schedule next home visit. Will also continue to schedule the ongoing transition of care for next Monday 6/12.  [name], RN Care Management Coordinator [place] Office [phone]

## 2015-10-25 ENCOUNTER — Other Ambulatory Visit: Payer: Self-pay | Admitting: *Deleted

## 2015-10-25 NOTE — Patient Outreach (Signed)
[place] [facility]) Care Management  10/25/2015  HATTON KASDORF 10/17/1945 [id number]   CSW will perform a case closure on patient, due to inability to maintain initial contact with patient, despite required number of phone attempts made and outreach letter mailed to patient's home.  CSW will notify patient's RNCM with [place] Management, [name] of CSW's plans to close patient's case.  CSW will fax a correspondence letter to patient's Primary Care Physician, [name] to ensure that Dr. [name] is aware of CSW's case closure plans.  CSW will submit a case closure request to [name], Care Management Assistant with [place] Management, in the form of an [employer].  CSW will ensure that [name] is aware of [name], RNCM with [place] Management, continued involvement with patient's care. [name], BSW, MSW, LCSW  Licensed Education officer, environmental Health System  Mailing [place] [address], [place], [place] [address] Physical Address-[address], [place], [place] [address] Toll Free Main # [phone] Fax # [phone] Cell # [phone]  Fax # [phone]  [name]

## 2015-10-26 DIAGNOSIS — I5022 Chronic systolic (congestive) heart failure: Secondary | ICD-10-CM | POA: Diagnosis not present

## 2015-10-26 DIAGNOSIS — I4891 Unspecified atrial fibrillation: Secondary | ICD-10-CM | POA: Diagnosis not present

## 2015-10-26 DIAGNOSIS — I130 Hypertensive heart and chronic kidney disease with heart failure and stage 1 through stage 4 chronic kidney disease, or unspecified chronic kidney disease: Secondary | ICD-10-CM | POA: Diagnosis not present

## 2015-10-26 DIAGNOSIS — F329 Major depressive disorder, single episode, unspecified: Secondary | ICD-10-CM | POA: Diagnosis not present

## 2015-10-26 DIAGNOSIS — N189 Chronic kidney disease, unspecified: Secondary | ICD-10-CM | POA: Diagnosis not present

## 2015-10-26 DIAGNOSIS — J449 Chronic obstructive pulmonary disease, unspecified: Secondary | ICD-10-CM | POA: Diagnosis not present

## 2015-10-26 DIAGNOSIS — R918 Other nonspecific abnormal finding of lung field: Secondary | ICD-10-CM | POA: Diagnosis not present

## 2015-10-26 DIAGNOSIS — F319 Bipolar disorder, unspecified: Secondary | ICD-10-CM | POA: Diagnosis not present

## 2015-10-26 DIAGNOSIS — I739 Peripheral vascular disease, unspecified: Secondary | ICD-10-CM | POA: Diagnosis not present

## 2015-10-27 ENCOUNTER — Telehealth: Payer: Self-pay

## 2015-10-27 NOTE — Telephone Encounter (Addendum)
[name] is a 70 y.o. male who was contacted via telephone for monitoring of apixaban (Eliquis) therapy.    ASSESSMENT Indication(s): atrial fibrillation Duration: indefinite  Labs:    Component Value Date/Time   AST 36 09/27/2015 1448   ALT 36 09/27/2015 1448   NA 142 10/11/2015 1022   NA 147* 07/19/2015 1546   K 4.9 10/11/2015 1022   CL 104 10/11/2015 1022   CO2 30 10/11/2015 1022   GLUCOSE 114* 10/11/2015 1022   GLUCOSE 121* 07/19/2015 1546   HGBA1C 5.6 10/11/2014 1526   BUN 25* 10/11/2015 1022   BUN 21 07/19/2015 1546   CREATININE 2.04* 10/11/2015 1022   CREATININE 1.33 08/10/2014 1221   CALCIUM 9.7 10/11/2015 1022   GFRNONAA 31* 10/11/2015 1022   GFRNONAA 54* 08/10/2014 1221   GFRAA 36* 10/11/2015 1022   GFRAA 62 08/10/2014 1221   WBC 10.5 09/28/2015 0516   HGB 12.5* 09/28/2015 0516   HCT 39.5 09/28/2015 0516   PLT 183 09/28/2015 0516    apixaban (Eliquis) Dose: 5 mg BID  Safety: Patient has not recent bleeding/thromboembolic events. Patient reports no recent signs or symptoms of bleeding, no signs of symptoms of thromboembolism. Medication changes: no. Was curious about nebulizer machine and wondering if he could get one.  Adherence: Patient has no known adherence challenges. Patient does correctly recite the dose. Contacted pharmacy and records indicate refills are consistent. Last fills: 5/16 (one month), 2/28 (3 month supply).  Patient Instructions: Patient advised to contact clinic or seek medical attention if signs/symptoms of bleeding or thromboembolism occur. Patient verbalized understanding by repeating back information.  Follow-up Recommended labs to consider: A1c Next appointment: pulmonary 10/30/15, internal med follow up 6/28.  Angelena Form PharmD Candidate  10/27/2015, 2:21 PM

## 2015-10-30 ENCOUNTER — Encounter: Payer: Self-pay | Admitting: Pulmonary Disease

## 2015-10-30 ENCOUNTER — Other Ambulatory Visit: Payer: Self-pay | Admitting: *Deleted

## 2015-10-30 ENCOUNTER — Ambulatory Visit (INDEPENDENT_AMBULATORY_CARE_PROVIDER_SITE_OTHER)
Admission: RE | Admit: 2015-10-30 | Discharge: 2015-10-30 | Disposition: A | Discharge status: Home / Self Care | Payer: Commercial Managed Care - HMO | Source: Ambulatory Visit | Attending: Pulmonary Disease | Admitting: Pulmonary Disease

## 2015-10-30 ENCOUNTER — Ambulatory Visit (INDEPENDENT_AMBULATORY_CARE_PROVIDER_SITE_OTHER): Payer: Commercial Managed Care - HMO | Admitting: Pulmonary Disease

## 2015-10-30 VITALS — BP 106/80 | HR 52 | Ht 75.0 in | Wt 156.6 lb

## 2015-10-30 DIAGNOSIS — J948 Other specified pleural conditions: Secondary | ICD-10-CM | POA: Diagnosis not present

## 2015-10-30 DIAGNOSIS — J90 Pleural effusion, not elsewhere classified: Secondary | ICD-10-CM

## 2015-10-30 DIAGNOSIS — J449 Chronic obstructive pulmonary disease, unspecified: Secondary | ICD-10-CM

## 2015-10-30 MED ORDER — MEGESTROL ACETATE 625 MG/5ML PO SUSP: 625.0000 mg | Freq: Every day | ORAL | Status: DC

## 2015-10-30 NOTE — Assessment & Plan Note (Signed)
Continued weigh loss post discharge Plan: Boost or Ensure daily as supplement for calories/ weight gain Megace 625 mg daily for appetite stimulation Please contact office for sooner follow up if symptoms do not improve or worsen or seek emergency care

## 2015-10-30 NOTE — Assessment & Plan Note (Signed)
Continued weight loss post discharge High risk malignancy 2/2 smoking history and asbestos exposure. Plan: We will get a chest x ray today and call you with results. We will schedule you for a PET scan to follow up on the changes in your lungs. We will schedule PFT's  Follow up with Dr. Vaughan Browner after PFT's and PET scan. Please contact office for sooner follow up if symptoms do not improve or worsen or seek emergency care

## 2015-10-30 NOTE — Progress Notes (Signed)
History of Present Illness [name] is a 70 y.o. 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 Former smoker who quit 05/2015.   6/12/2017Consultation: Pt presents to the office today for follow up and consultation after a hospitalization for recurrent pneumonia and pleural effusions. He states he is doing better but has shortness of breath with both exertion and rest. He rarely coughs up secretions, but at times there is some thick green secretions.He denies chest pain, fever, orthopnea, or hemoptysis. No leg or calf pain.He states he was 171 pounds when he left the hospital in May, and now weighs about 157 pounds.Marland KitchenHe is high risk with his smoking history for malignancy so we will schedule PET scan as follow up, and get PFT's.  Tests STUDIES:  08/14/15 ECHO >> LV moderately dilated, EF 30%, diffuse hypokinesis, mild AR, mod MR, mod TR 5/10 CT Chest >> asbestos-related pleural disease including extensive calcified pleural plaques in left greater than right pleural spaces, small bilateral pleural effusions, near complete right lower lobe consolidation, volume loss, thick transplant pulmonic bandlike opacity in the periphery of the left lung and nodular pleural-based focus of consolidation in the left lower lobe concerning for progressive sequela I of asbestos-related pleural disease, new mild subcarinal and bilateral hilar adenopathy, mild cardiomegaly. 09/29/15> Thoracentesis : transudative 09/29/15: CXR>>IMPRESSION: No pneumothorax following thoracentesis on the right.  Past medical hx Past Medical History  Diagnosis Date  . Hypertension   . Chronic systolic heart failure ([place])     a. NICM - EF 35% with normal cors 2003. b. Last echo 11/2012 - EF 25-30%.  . Depression   . GERD (gastroesophageal reflux disease)   . Portal vein thrombosis   . Avascular necrosis of bones of both hips (HCC)    Status post bilateral hip replacements  . Gastritis   . Alcohol abuse   . Erectile dysfunction   . Bipolar disorder ([place])   . Hydrocele, unspecified   . Lung nodule     Chest CT scan on 12/14/2008 showed a nodular opacity at the left lung base felt to most likely represent scarring.  Follow-up chest CT scan on 06/02/2010 showed parenchymal scarring in the left apex, left  lower lobe, and lingula; some of this scarring at the left base had a nodular appearance, unchanged. Chest 09/2012 - stable.  . 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  Thyroid scan 08/28/2009 was normal with no focal areas of abnormal increased or decreased activity seen; the uptake of I 131 sodium iodide at 24 hours was 5.7%.  TSH and free T4 normalized by 08/16/2009.  . Intestinal obstruction ([place])   . COPD (chronic obstructive pulmonary disease) ([place])   . E coli bacteremia   . DVT (deep venous thrombosis) ([place])     He has hypercoagulability with multiple prior DVTs  . Pleural plaque     H/o asbestos exposure. Chest CT on 06/02/2010 showed stable extensive calcified pleural plaques involving the left hemithorax, consistent with asbestos related pleural disease.  . Weight loss     Normal colonoscopy by Dr. Olevia Perches on 11/06/2010.  . 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 b. St. Jude gen change 2010.  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  . Noncompliance   .  Thrombocytopenia ([place])   . Osteoarthritis   . Spondylosis   . Anxiety   . CKD (chronic kidney disease)     Renal U/S 12/04/2009 showed no pathological findings. Labs 12/04/2009 include normal ESR, C3, C4; neg ANA; SPEP showed nonspecific increase in the alpha-2 region with no M-spike; UPEP showed no monoclonal free light chains; urine IFE showed polyclonal increase in feree Kappa  and/or free Lambda light chains. Baseline Cr reported 1.7-2.5.  . STEMI (ST elevation myocardial infarction) ([place]) 10/11/2014  . Hyperlipemia   . HCAP (healthcare-associated pneumonia) 08/24/2015  . On home oxygen therapy     "3L periodically" (08/24/2015)  . CHF (congestive heart failure) [facility])      Past Surgical History  Procedure Laterality Date  . Total hip arthroplasty Right 03/08/2008     By Dr. Hiram Comber  . Tee without cardioversion N/A 05/27/2013    Procedure: TRANSESOPHAGEAL ECHOCARDIOGRAM (TEE);  Surgeon: Candee Furbish, MD;  Location: [facility] ENDOSCOPY;  Service: Cardiovascular;  Laterality: N/A;  . Cardioversion N/A 05/27/2013    Procedure: CARDIOVERSION;  Surgeon: Candee Furbish, MD;  Location: [facility] ENDOSCOPY;  Service: Cardiovascular;  Laterality: N/A;  . Cardiac catheterization  2003    Nl cors, EF 35%  . Embolectomy Right 12/10/2013    Procedure: Right Femoral Embolectomy; Fasciotomy Right Lower Leg with Intraoperative arteriogram;  Surgeon: Rosetta Posner, MD;  Location: [place];  Service: Vascular;  Laterality: Right;  . Intraoperative arteriogram Right 12/10/2013    Procedure: INTRA OPERATIVE ARTERIOGRAM;  Surgeon: Rosetta Posner, MD;  Location: [place];  Service: Vascular;  Laterality: Right;  . Fasciotomy closure Right 12/15/2013    Procedure: LEG FASCIOTOMY CLOSURE;  Surgeon: Rosetta Posner, MD;  Location: [facility] OR;  Service: Vascular;  Laterality: Right;  . Embolectomy Left 10/12/2014    Procedure: Left Femoral Thrombectomy with intraoperative arteriogram;  Surgeon: Rosetta Posner, MD;  Location: [facility] OR;  Service: Vascular;  Laterality: Left;  . Hemiarthroplasty hip Left 09/09/2002    bipolar; Dr. Lafayette Dragon  . Joint replacement    . Insert / replace / remove pacemaker  02/2003    Dr. Roe Rutherford, [place] pulse generator identity SR model [id number] serial [id number]; gen change by Greggory Brandy  . Insert / replace / remove pacemaker  06/17/2008    Lead and generator change w/ St. Jude Medical Tendril ST  model [id number] (serial  [id number]).        Family History  Problem Relation Age of Onset  . Hypertension Mother   . Cancer Brother     Unsure of type.  . Asthma Mother   . Coronary artery disease Mother   . Colon cancer Neg Hx   . Lung cancer Neg Hx   . Prostate cancer Neg Hx   . Stroke      mother  . Heart attack      brother and sister ?age   . Heart disease Brother   . Cancer Mother     Current Outpatient Prescriptions on File Prior to Visit  Medication Sig  . albuterol (VENTOLIN HFA) 108 (90 BASE) MCG/ACT inhaler Inhale 2 puffs into the lungs every 6 (six) hours as needed for wheezing or shortness of breath.  [name] apixaban (ELIQUIS) 5 MG TABS tablet Take 1 tablet (5 mg total) by mouth 2 (two) times daily.  [name] aspirin 81 MG chewable tablet Chew 1 tablet (81 mg total) by mouth daily.  [name] buPROPion (WELLBUTRIN) 75 MG tablet Take 1 tablet (75  mg total) by mouth 2 (two) times daily.  . cholecalciferol (VITAMIN D) 1000 units tablet Take 1 tablet (1,000 Units total) by mouth daily.  [name] diltiazem (CARDIZEM CD) 120 MG 24 hr capsule Take 1 capsule (120 mg total) by mouth daily.  . furosemide (LASIX) 40 MG tablet Take 1 tablet (40 mg total) by mouth daily.  . metoprolol succinate (TOPROL-XL) 100 MG 24 hr tablet Take 100 mg by mouth daily. Take with or immediately following a meal.  . mirtazapine (REMERON) 15 MG tablet TAKE 1 TABLET BY MOUTH AT BEDTIME  . Multiple Vitamin (MULTIVITAMIN WITH MINERALS) TABS tablet Take 1 tablet by mouth daily. Reported on 10/11/2015  . omeprazole (PRILOSEC) 40 MG capsule Take 1 capsule (40 mg total) by mouth daily.  [name] oxyCODONE-acetaminophen (PERCOCET) 10-325 MG tablet Take 1 tablet by mouth every 4 (four) hours as needed for pain. Do not take more than 5 tablets in 24 hours.  . rosuvastatin (CRESTOR) 20 MG tablet Take 1 tablet (20 mg total) by mouth daily at 6 PM.  . senna-docusate ([employer] SENNA PLUS) 8.6-50 MG tablet Take 1 tablet by mouth daily as needed for  moderate constipation.  [name] tiotropium (SPIRIVA) 18 MCG inhalation capsule Place 1 capsule (18 mcg total) into inhaler and inhale daily.   No current facility-administered medications on file prior to visit.     Allergies  Allergen Reactions  . Amiodarone Other (See Comments)    Thyroiditis in the setting of amiodarone use  . Penicillins Rash    Review Of Systems:  Constitutional:   +  weight loss, no night sweats,  Fevers, chills, +fatigue, or  lassitude.  HEENT:   No headaches,  Difficulty swallowing,  Tooth/dental problems, or  Sore throat,                No sneezing, itching, ear ache, nasal congestion, post nasal drip,   CV:  No chest pain,  Orthopnea, PND, swelling in lower extremities, anasarca, dizziness, palpitations, syncope.   GI  No heartburn, indigestion, abdominal pain, nausea, vomiting, diarrhea, change in bowel habits, +loss of appetite, bloody stools.   Resp: + shortness of breath with exertion and at rest.  No excess mucus, no productive cough,  No non-productive cough,  No coughing up of blood.  No change in color of mucus.  No wheezing.  No chest wall deformity  Skin: no rash or lesions.  GU: no dysuria, change in color of urine, no urgency or frequency.  No flank pain, no hematuria   MS:  No joint pain or swelling.  No decreased range of motion.  No back pain.  Psych:  No change in mood or affect. No depression or anxiety.  No memory loss.   Vital Signs BP 106/80 mmHg  Pulse 52  Ht '6\' 3"'  (1.905 m)  Wt 156 lb 9.6 oz (71.033 kg)  BMI 19.57 kg/m2  SpO2 99%   Physical Exam:  General- No distress,  A&Ox3, pleasant, frail ENT: No sinus tenderness, TM clear, pale nasal mucosa, no oral exudate,no post nasal drip, no LAN Cardiac: S1, S2, regular rate and rhythm, no murmur Chest: No wheeze/ rales/ dullness; no accessory muscle use, no nasal flaring, no sternal retractions, even non-labored respirations Abd.: Soft Non-tender Ext: No clubbing cyanosis,  edema Neuro:  normal strength Skin: No rashes, warm and dry,BLE changes c/w chronic venous stasis  Psych: normal mood and behavior   Assessment/Plan  Pleural effusion, right Continued weight loss post discharge High risk malignancy  2/2 smoking history and asbestos exposure. Plan: We will get a chest x ray today and call you with results. We will schedule you for a PET scan to follow up on the changes in your lungs. We will schedule PFT's  Follow up with Dr. Vaughan Browner after PFT's and PET scan. Please contact office for sooner follow up if symptoms do not improve or worsen or seek emergency care     Weight loss Continued weigh loss post discharge Plan: Boost or Ensure daily as supplement for calories/ weight gain Megace 625 mg daily for appetite stimulation Please contact office for sooner follow up if symptoms do not improve or worsen or seek emergency care    COPD with hypoxia ([place])  Plan: Continue oxygen at your baseline of 2 L [place]. Your goal oxygen saturations are 88-94% Continue using your albuterol rescue inhaler as needed for shortness of breath or wheezing up to every 6 hours. Please add boost once daily. We will send in a prescription for Megace 625 daily for appetitie stimulation. We will get a chest x ray today and call you with results.      Magdalen Spatz, NP 10/30/2015  4:30 PM   Attending note: I have seen and examined the patient with nurse practitioner/resident and agree with the note. History, labs and imaging reviewed.  70 Y/O with complicated medical history, CHF, heavy smoker, pleural plaques, presumed asbestos exposure but patient does not recall. He has had mutiple  admissions with recurrent resp failure. CT shows multiple calcified pleural plaques, rt lung consolidation with effusions, nodular opacities, rounded atelectasis.  Thoracentesis 5/12. No malignancy on cytology  He has lost considerable weight recently with loss of appetite.  This is  worrisome for malignancy even though the thora was negative.  We will eval with PET scan. Futher w/u depending on the findings CXR today Megace for loss of appetite.  Marshell Garfinkel MD [facility] Pager [phone] If no answer or after 3pm call: [phone] 10/30/2015, 5:00 PM

## 2015-10-30 NOTE — Patient Outreach (Signed)
[facility]) Care Management  10/30/2015  [name] [birth year] [id number]   RN spoke with pt today and inquired on his ongoing recovery. Pt states he now fills his pillbox with no problems and has enough medications. Pt also states he has received the mobile meals arranged by this RN from last month's home visit. RN inquired on pt's ongoing management of care related to his HF. Pt reported today's weight at 156 lbs, yesterday 156 lbs and last week was 158 lbs. Pt denies any swelling or some SOB with exertional activities and remains in the GREEN zone with no acute issues at this time. Pt reports Dr. [name] has indicated pt should be using a nebulizer machine however pt states he took the initial prescription to his pharmacy and his co-pay was $55-$60 which he can not afford. Pt states this last visit to his primary provider the office nurse was suppose to assist with obtaining a nebulizer for this pt however pt states he has not heard anything.   Due to the delay with this process RN consulted with [facility] and was informed that pt's pt's nebulizer can be covered under part D with prescribed provided to a DME agency. Based upon the conversation with the pt who has agreed to assistance via [place] if available. RN notified Dr. [name] and requested a prescription to be submitted to [facility] for a nebulizer and the requested type of medicine for the device. RN also requested possible visit for educating pt on the device for future uses.  Pt aware to expect a call from the agency and/or provider's office confirming.Will continue another transition of care next week and schedule accordingly.  [name], RN Care Management Coordinator [place] Office [phone]

## 2015-10-30 NOTE — Assessment & Plan Note (Signed)
  Plan: Continue oxygen at your baseline of 2 L [place]. Your goal oxygen saturations are 88-94% Continue using your albuterol rescue inhaler as needed for shortness of breath or wheezing up to every 6 hours. Please add boost once daily. We will send in a prescription for Megace 625 daily for appetitie stimulation. We will get a chest x ray today and call you with results.

## 2015-10-30 NOTE — Patient Instructions (Addendum)
It is nice to meet you today. Continue oxygen at your baseline of 2 L . Your goal oxygen saturations are 88-94% Continue using your albuterol rescue inhaler as needed for shortness of breath or wheezing up to every 6 hours. Please add boost once daily. We will send in a prescription for Megace 625 daily for appetitie stimulation. We will get a chest x ray today and call you with results. We will schedule you for a PET scan to follow up on the changes in your lungs. We will schedule PFT's  Follow up with Dr. Vaughan Browner after PFT's and PET scan. Please contact office for sooner follow up if symptoms do not improve or worsen or seek emergency care

## 2015-11-01 ENCOUNTER — Other Ambulatory Visit: Payer: Self-pay | Admitting: Internal Medicine

## 2015-11-01 DIAGNOSIS — J438 Other emphysema: Secondary | ICD-10-CM

## 2015-11-02 DIAGNOSIS — J449 Chronic obstructive pulmonary disease, unspecified: Secondary | ICD-10-CM | POA: Diagnosis not present

## 2015-11-02 DIAGNOSIS — I4891 Unspecified atrial fibrillation: Secondary | ICD-10-CM | POA: Diagnosis not present

## 2015-11-02 DIAGNOSIS — N189 Chronic kidney disease, unspecified: Secondary | ICD-10-CM | POA: Diagnosis not present

## 2015-11-02 DIAGNOSIS — F319 Bipolar disorder, unspecified: Secondary | ICD-10-CM | POA: Diagnosis not present

## 2015-11-02 DIAGNOSIS — I130 Hypertensive heart and chronic kidney disease with heart failure and stage 1 through stage 4 chronic kidney disease, or unspecified chronic kidney disease: Secondary | ICD-10-CM | POA: Diagnosis not present

## 2015-11-02 DIAGNOSIS — R918 Other nonspecific abnormal finding of lung field: Secondary | ICD-10-CM | POA: Diagnosis not present

## 2015-11-02 DIAGNOSIS — F329 Major depressive disorder, single episode, unspecified: Secondary | ICD-10-CM | POA: Diagnosis not present

## 2015-11-02 DIAGNOSIS — I739 Peripheral vascular disease, unspecified: Secondary | ICD-10-CM | POA: Diagnosis not present

## 2015-11-02 DIAGNOSIS — I5022 Chronic systolic (congestive) heart failure: Secondary | ICD-10-CM | POA: Diagnosis not present

## 2015-11-03 ENCOUNTER — Telehealth: Payer: Self-pay

## 2015-11-03 DIAGNOSIS — N189 Chronic kidney disease, unspecified: Secondary | ICD-10-CM | POA: Diagnosis not present

## 2015-11-03 DIAGNOSIS — J449 Chronic obstructive pulmonary disease, unspecified: Secondary | ICD-10-CM | POA: Diagnosis not present

## 2015-11-03 DIAGNOSIS — F319 Bipolar disorder, unspecified: Secondary | ICD-10-CM | POA: Diagnosis not present

## 2015-11-03 DIAGNOSIS — I739 Peripheral vascular disease, unspecified: Secondary | ICD-10-CM | POA: Diagnosis not present

## 2015-11-03 DIAGNOSIS — I130 Hypertensive heart and chronic kidney disease with heart failure and stage 1 through stage 4 chronic kidney disease, or unspecified chronic kidney disease: Secondary | ICD-10-CM | POA: Diagnosis not present

## 2015-11-03 DIAGNOSIS — I5022 Chronic systolic (congestive) heart failure: Secondary | ICD-10-CM | POA: Diagnosis not present

## 2015-11-03 DIAGNOSIS — F329 Major depressive disorder, single episode, unspecified: Secondary | ICD-10-CM | POA: Diagnosis not present

## 2015-11-03 DIAGNOSIS — I4891 Unspecified atrial fibrillation: Secondary | ICD-10-CM | POA: Diagnosis not present

## 2015-11-03 DIAGNOSIS — R918 Other nonspecific abnormal finding of lung field: Secondary | ICD-10-CM | POA: Diagnosis not present

## 2015-11-03 NOTE — Telephone Encounter (Signed)
Radovan from [facility] want to speak with a nurse about discharging all therapies.

## 2015-11-03 NOTE — Telephone Encounter (Signed)
Rtc, lm for rtc 

## 2015-11-06 ENCOUNTER — Other Ambulatory Visit: Payer: Self-pay | Admitting: *Deleted

## 2015-11-06 ENCOUNTER — Other Ambulatory Visit: Payer: Self-pay

## 2015-11-06 DIAGNOSIS — G8929 Other chronic pain: Secondary | ICD-10-CM

## 2015-11-06 NOTE — Telephone Encounter (Signed)
Last script 5/24 Next appt 6/28 Last uds 4/6- rapid

## 2015-11-06 NOTE — Telephone Encounter (Signed)
Pt requesting the nurse to call back regarding oxycodone.

## 2015-11-06 NOTE — Patient Outreach (Signed)
[name]) Care Management  11/06/2015  TATSUO MCNELL 03-03-1946 [id number]   RN attempted outreach call to pt on both the home and mobile contact numbers however unsuccessful. RN left a HIPAA approved voice message and will consider the pending schedule home visit on 6/22 will completed the telephone transition of care contact for this week. Will not reschedule this telephone call based upon the pending visit.    [name], RN Care Management Coordinator [place] Office [phone]

## 2015-11-06 NOTE — Telephone Encounter (Signed)
No answer

## 2015-11-07 MED ORDER — OXYCODONE-ACETAMINOPHEN 10-325 MG PO TABS: 1.0000 | ORAL_TABLET | ORAL | Status: DC | PRN

## 2015-11-07 NOTE — Telephone Encounter (Signed)
OT calls and states pt did answer door for either PT nor OT so pt has been discharged from both

## 2015-11-07 NOTE — Telephone Encounter (Signed)
Lm for rtc 

## 2015-11-09 ENCOUNTER — Other Ambulatory Visit: Payer: Self-pay | Admitting: *Deleted

## 2015-11-09 ENCOUNTER — Telehealth: Payer: Self-pay

## 2015-11-09 NOTE — Patient Outreach (Signed)
[facility]) Care Management  11/09/2015  [name] [birth year] [id number]   RN received a call from the [facility] office that pt called and inquired on the scheduled appointment today with this RN needed to know what time the appointment today. RN attempted to call pt and left a message detaining the time of the scheduled appointment was today and requested pt to contact this RN if pt needs to changes the appointed time and day. RN will visit pt today if no contact received from this pt today.   [name], RN Care Management Coordinator [address] Office [phone]

## 2015-11-09 NOTE — Patient Outreach (Signed)
[facility]) Care Management   11/09/2015  [name] [birth year] [id number]  [name] is an 70 y.o. male  Subjective:  HF: Pt reports he has not weighed everyday but remembers his last weighed on Sunday at 158 lbs and denies any swelling or fluid retention or trouble breathing. Pt aware of the importance of daily weights and indicates he will start weighing daily in monitoring his weight. Pt able to recite the HF zone and verified he is in the GREEN zone today with no swelling or issues with breathing.  MEDICATION: Pt reports he is taking all his medications as prescribed and started a new medication to assist with increasing his appetite due to his ongoing weight loss (Megestrol Acetate). Pt states this medication has helped and he can tell the difference with an increased appetite.   COMMUNITY RESOURCES: Pt states [facility] has finished and mobile meals has being delivery pt's his daily meals with no delays. Pt extends his gratitude for the arrangements of this services via [facility]. NEW ADDRESS: Pt reports he will be moving into the [facility] facility off of [address]. States he has already paid for this month and can move into anytime but will probably be 7/1 before he gets settled. Pt will be living alone however will have sufficient transportation services.  Pt provided this RN the new address for next month's home visit ([address], [id number], [place], [place] [address]  Objective:   Review of Systems  Constitutional: Negative.   HENT: Negative.   Eyes: Negative.   Respiratory: Negative.   Cardiovascular: Negative.   Gastrointestinal: Negative.   Genitourinary: Negative.   Musculoskeletal: Negative.   Skin: Negative.   Neurological: Negative.   Endo/Heme/Allergies: Negative.   Psychiatric/Behavioral: Negative.     Physical Exam  Constitutional: He is oriented to person, place, and time. He appears well-developed and well-nourished.  HENT:   Right Ear: External ear normal.  Left Ear: External ear normal.  Neck: Normal range of motion.  Cardiovascular: Normal heart sounds.   Respiratory: Breath sounds normal.  GI: Soft. Bowel sounds are normal.  Musculoskeletal: Normal range of motion.  Neurological: He is alert and oriented to person, place, and time.  Skin: Skin is warm and dry.  Psychiatric: He has a normal mood and affect. His behavior is normal. Judgment and thought content normal.    Encounter Medications:   Outpatient Encounter Prescriptions as of 11/09/2015  Medication Sig  . albuterol (VENTOLIN HFA) 108 (90 BASE) MCG/ACT inhaler Inhale 2 puffs into the lungs every 6 (six) hours as needed for wheezing or shortness of breath.  [name] apixaban (ELIQUIS) 5 MG TABS tablet Take 1 tablet (5 mg total) by mouth 2 (two) times daily.  [name] aspirin 81 MG chewable tablet Chew 1 tablet (81 mg total) by mouth daily.  [name] buPROPion (WELLBUTRIN) 75 MG tablet Take 1 tablet (75 mg total) by mouth 2 (two) times daily.  . cholecalciferol (VITAMIN D) 1000 units tablet Take 1 tablet (1,000 Units total) by mouth daily.  [name] diltiazem (CARDIZEM CD) 120 MG 24 hr capsule Take 1 capsule (120 mg total) by mouth daily.  . furosemide (LASIX) 40 MG tablet Take 1 tablet (40 mg total) by mouth daily.  . megestrol (MEGACE ES) 625 MG/5ML suspension Take 5 mLs (625 mg total) by mouth daily.  . metoprolol succinate (TOPROL-XL) 100 MG 24 hr tablet Take 100 mg by mouth daily. Take with or immediately following a meal.  .  mirtazapine (REMERON) 15 MG tablet TAKE 1 TABLET BY MOUTH AT BEDTIME  . Multiple Vitamin (MULTIVITAMIN WITH MINERALS) TABS tablet Take 1 tablet by mouth daily. Reported on 10/11/2015  . omeprazole (PRILOSEC) 40 MG capsule Take 1 capsule (40 mg total) by mouth daily.  [name] oxyCODONE-acetaminophen (PERCOCET) 10-325 MG tablet Take 1 tablet by mouth every 4 (four) hours as needed for pain. Do not take more than 5 tablets in 24 hours.  . rosuvastatin (CRESTOR)  20 MG tablet Take 1 tablet (20 mg total) by mouth daily at 6 PM.  . senna-docusate ([employer]S SENNA PLUS) 8.6-50 MG tablet Take 1 tablet by mouth daily as needed for moderate constipation.  [name] tiotropium (SPIRIVA) 18 MCG inhalation capsule Place 1 capsule (18 mcg total) into inhaler and inhale daily.   No facility-administered encounter medications on file as of 11/09/2015.    Functional Status:   In your present state of health, do you have any difficulty performing the following activities: 10/11/2015 10/09/2015  Hearing? N N  Vision? N N  Difficulty concentrating or making decisions? N N  Walking or climbing stairs? Y Y  Dressing or bathing? Y Y  Doing errands, shopping? Y Y  Conservation officer, nature and eating ? - N  Using the Toilet? - N  In the past six months, have you accidently leaked urine? - Y  Do you have problems with loss of bowel control? - N  Managing your Medications? - Y  Managing your Finances? - Y  Housekeeping or managing your Housekeeping? - Y    Fall/Depression Screening:    PHQ 2/9 Scores 10/11/2015 10/09/2015 09/10/2015 07/28/2015 07/19/2015 06/27/2015 06/16/2015  PHQ - 2 Score 0 0 0 4 1 0 3  PHQ- 9 Score - - - 15 - - 10  Exception Documentation - - - - - - -  BP 120/68 mmHg  Pulse 69  Resp 20  Ht 1.905 m (6\' 3" )  Wt 158 lb (71.668 kg)  BMI 19.75 kg/m2  SpO2 96%  Assessment:  Ongoing case management related to HF Follow up on medication related to nutritional issues (Megestrol) Follow up on community resources related Mobile meals Plan:  Will complete physical assessment with no acute events or issues to intervene at this time. RN will review HF zones and verified pt remains in the GREEN zone and able to recite with teach back method with increase knowledge on HF. Strongly encouraged pt to complete daily weights in order to monitor fluid retention and prevent possible CH symptoms from occurring. All HF zones reviewed as RN verified pt's awareness on what to do if acute symptoms  are presented.  Will verify pt's adherence with all his existing medications and discuss the new medication prescribed and it's purpose (Megestrol Acetate).  Will continue to encouraged adherence and allow additional time for pt's adherence. Will verify pt's ongoing involvement with [facility] if this services continues and the community resource referral mobile meals. Will also verify if pt' mailed his MCD application as discussed last home visit. Will review plan of care and adjust goal accordingly. Will re-evaluate on next home visit that will schedule next month according to pt's scheduled appointments. Note pt will relocate to a new address at [facility] living facility off of [address]. Will provide new address to administrative staff to update in EPIC.   [name], RN Care Management Coordinator [place] Office [phone]

## 2015-11-09 NOTE — Telephone Encounter (Signed)
Pt want to know if pain med is ready.

## 2015-11-10 ENCOUNTER — Telehealth: Payer: Self-pay | Admitting: Internal Medicine

## 2015-11-10 NOTE — Telephone Encounter (Signed)
   Reason for call:   I received a call from [name] at 135  PM indicating he didn't get his pain medication.    Pertinent Data:   He called Monday to indicate he was out of prescription for his pain medication.  He is unhappy it is not filled and ready today because the clinic is closed.   Assessment / Plan / Recommendations:   I expressed to him my deepest regrets that I cannot help him as the medication he is requesting cannot be refilled at this time.  He indicated he will have a word with the nurses on Monday.  As always, pt is advised that if symptoms worsen or new symptoms arise, they should go to an urgent care facility or to to ER for further evaluation.   Travis Dubin, MD   11/10/2015, 1:37 PM

## 2015-11-13 ENCOUNTER — Other Ambulatory Visit: Payer: Self-pay

## 2015-11-13 ENCOUNTER — Telehealth: Payer: Self-pay | Admitting: *Deleted

## 2015-11-13 NOTE — Telephone Encounter (Signed)
In response to pt's calls, i tried to call him last week several times, when i spoke w/ him today, informed him i had been trying to find him and he replied my phones been messing up and im at a friend's house. i told him i had been worried about not being able to speak w/ him and that dr patel and i had spoken about not being able to reach him, he will pick up script today possibly

## 2015-11-13 NOTE — Telephone Encounter (Signed)
This has been done, pt aware

## 2015-11-13 NOTE — Telephone Encounter (Signed)
Spoke w/ pt, he is staying with a friend at the moment and will be moving soon to his own place

## 2015-11-13 NOTE — Telephone Encounter (Signed)
[name] to call patient back.

## 2015-11-13 NOTE — Telephone Encounter (Signed)
Pt want to know if pain med is ready.

## 2015-11-13 NOTE — Telephone Encounter (Signed)
Have spoken to pt

## 2015-11-15 ENCOUNTER — Ambulatory Visit (HOSPITAL_COMMUNITY)
Admission: RE | Admit: 2015-11-15 | Discharge: 2015-11-15 | Disposition: A | Discharge status: Home / Self Care | Payer: Commercial Managed Care - HMO | Source: Ambulatory Visit | Attending: Pulmonary Disease | Admitting: Pulmonary Disease

## 2015-11-15 ENCOUNTER — Encounter: Payer: Self-pay | Admitting: Internal Medicine

## 2015-11-15 ENCOUNTER — Ambulatory Visit (INDEPENDENT_AMBULATORY_CARE_PROVIDER_SITE_OTHER): Payer: Commercial Managed Care - HMO | Admitting: Internal Medicine

## 2015-11-15 VITALS — BP 111/83 | HR 59 | Temp 97.5°F | Ht 75.0 in | Wt 157.8 lb

## 2015-11-15 DIAGNOSIS — J929 Pleural plaque without asbestos: Secondary | ICD-10-CM | POA: Insufficient documentation

## 2015-11-15 DIAGNOSIS — J449 Chronic obstructive pulmonary disease, unspecified: Secondary | ICD-10-CM

## 2015-11-15 DIAGNOSIS — Z681 Body mass index (BMI) 19 or less, adult: Secondary | ICD-10-CM

## 2015-11-15 DIAGNOSIS — R630 Anorexia: Secondary | ICD-10-CM

## 2015-11-15 DIAGNOSIS — J90 Pleural effusion, not elsewhere classified: Secondary | ICD-10-CM

## 2015-11-15 DIAGNOSIS — C459 Mesothelioma, unspecified: Secondary | ICD-10-CM | POA: Diagnosis not present

## 2015-11-15 DIAGNOSIS — I700 Atherosclerosis of aorta: Secondary | ICD-10-CM | POA: Diagnosis not present

## 2015-11-15 DIAGNOSIS — F528 Other sexual dysfunction not due to a substance or known physiological condition: Secondary | ICD-10-CM

## 2015-11-15 DIAGNOSIS — I4811 Longstanding persistent atrial fibrillation: Secondary | ICD-10-CM

## 2015-11-15 DIAGNOSIS — N529 Male erectile dysfunction, unspecified: Secondary | ICD-10-CM

## 2015-11-15 DIAGNOSIS — I5042 Chronic combined systolic (congestive) and diastolic (congestive) heart failure: Secondary | ICD-10-CM

## 2015-11-15 DIAGNOSIS — J438 Other emphysema: Secondary | ICD-10-CM

## 2015-11-15 DIAGNOSIS — I2510 Atherosclerotic heart disease of native coronary artery without angina pectoris: Secondary | ICD-10-CM | POA: Diagnosis not present

## 2015-11-15 DIAGNOSIS — I481 Persistent atrial fibrillation: Secondary | ICD-10-CM | POA: Diagnosis not present

## 2015-11-15 DIAGNOSIS — Z7901 Long term (current) use of anticoagulants: Secondary | ICD-10-CM

## 2015-11-15 LAB — PULMONARY FUNCTION TEST
DL/VA % PRED: 53 %
DL/VA: 2.55 ml/min/mmHg/L
DLCO UNC: 10.38 ml/min/mmHg
DLCO unc % pred: 28 %
FEF 25-75 POST: 2.22 L/s
FEF 25-75 Pre: 1.17 L/sec
FEF2575-%Change-Post: 89 %
FEF2575-%PRED-POST: 83 %
FEF2575-%PRED-PRE: 43 %
FEV1-%CHANGE-POST: 20 %
FEV1-%PRED-POST: 67 %
FEV1-%PRED-PRE: 55 %
FEV1-PRE: 2 L
FEV1-Post: 2.41 L
FEV1FVC-%Change-Post: 16 %
FEV1FVC-%PRED-PRE: 88 %
FEV6-%Change-Post: 5 %
FEV6-%PRED-POST: 69 %
FEV6-%Pred-Pre: 65 %
FEV6-POST: 3.19 L
FEV6-Pre: 3.03 L
FEV6FVC-%CHANGE-POST: 1 %
FEV6FVC-%PRED-POST: 106 %
FEV6FVC-%Pred-Pre: 104 %
FVC-%Change-Post: 3 %
FVC-%Pred-Post: 65 %
FVC-%Pred-Pre: 63 %
FVC-PRE: 3.08 L
FVC-Post: 3.19 L
POST FEV1/FVC RATIO: 76 %
PRE FEV1/FVC RATIO: 65 %
PRE FEV6/FVC RATIO: 98 %
Post FEV6/FVC ratio: 100 %
RV % pred: 94 %
RV: 2.49 L
TLC % PRED: 68 %
TLC: 5.27 L

## 2015-11-15 LAB — GLUCOSE, CAPILLARY: GLUCOSE-CAPILLARY: 82 mg/dL (ref 65–99)

## 2015-11-15 MED ORDER — ALBUTEROL SULFATE (2.5 MG/3ML) 0.083% IN NEBU
2.5000 mg | INHALATION_SOLUTION | Freq: Once | RESPIRATORY_TRACT | Status: AC
  Administered 2015-11-15: 2.5 mg via RESPIRATORY_TRACT

## 2015-11-15 MED ORDER — DRONABINOL 2.5 MG PO CAPS: 2.5000 mg | ORAL_CAPSULE | Freq: Two times a day (BID) | ORAL | Status: DC

## 2015-11-15 MED ORDER — FLUDEOXYGLUCOSE F - 18 (FDG) INJECTION
7.8000 | Freq: Once | INTRAVENOUS | Status: AC | PRN
  Administered 2015-11-15: 7.8 via INTRAVENOUS

## 2015-11-15 NOTE — Progress Notes (Signed)
Medicine attending: Medical history, presenting problems, physical findings, and medications, reviewed with resident physician Dr Vishal Patel on the day of the patient visit and I concur with his evaluation and management plan. 

## 2015-11-15 NOTE — Progress Notes (Signed)
Patient ID: [name], male   DOB: 10-Apr-1946, 70 y.o.   MRN: [id number]   CC: Erectile dysfunction and decreased appetite HPI: [name] is a 70 y.o. male with PMH as listed below who presents for follow up for his CHF, COPD, and Atrial fibrillation with complaint of poor appetite and erectile dysfunction.   He has been hospitalized 5 times in the last 3.5 months for a combination of his heart and lung diseases, once requiring intubation and admission to the ICU. CT chest on 09/27/2015 showed changes suspicious for asbestos related pleural disease, including extensive calcified pleural plaques, L > R. R > L pleural effusion was also seen and a thoracenteses was performed, yielding 400 mL fluid. Cytology revealed reactive mesothelial cells and numerous lymphocytes. He followed up with Pulmonology earlier this month. A repeat CXR did not show recurrence of his pleural effusion. PET scan on 6/28 did not show hypermetabolic masses or adenopathy, but did show bilateral calcified pleural plaques compatible with prior asbestos exposure.  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 He worked in those buildings for 6-7 years.  PFTs on 6/28 show an FEV1 55% predicted and FEV1/FVC ratio of 76% with response to bronchodilators. He has both obstructive and restrictive disease.  For his Chronic combined systolic diastolic CHF, he has restarted Metoprolol 100 mg 24h and is now taking Diltiazem 120 mg 24h. He says he is also taking Diltiazem 30 mg every 6 hours prn. He felt dizzy recently after taking the Diltiazem 30 mg. He continues to take Lasix 40 mg daily without issue. He says his shortness of breath is improved since his last hospitalization, but has occasional episodes if he is  walking from his driveway to his door.  For his Atrial Fibrillation, patient is now back on Eliquis 5 mg twice daily. During his earlier hospitalization, Eliquis was reduced to 2.5 mg BID given his borderline GFR of 30, which subsequently improved.  For his decreased appetite, patient was started on Megace 5 mLs (625 mg) daily. He has tried this for about 1.5 weeks without improvement. He says he is able to eat a full breakfast that his wife makes for him, but he only is able to eat small portion meals in the evening. He says Prednisone was helpful for his appetite.  Patient also reports a 1 year history of erectile dysfunction. He says his wife is asking him to speak with [place] about how we can help him obtain erections. He has desire, but does not obtain morning erections.    Past Medical History  Diagnosis Date  . Hypertension   . Chronic systolic heart failure ([place])     a. NICM - EF 35% with normal cors 2003. b. Last echo 11/2012 - EF 25-30%.  . Depression   . GERD (gastroesophageal reflux disease)   . Portal vein thrombosis   . Avascular necrosis of bones of both hips ([facility])     Status post bilateral hip replacements  . Gastritis   . Alcohol abuse   . Erectile dysfunction   . Bipolar disorder ([place])   . Hydrocele, unspecified   . Lung nodule     Chest CT scan on 12/14/2008 showed a nodular opacity at the left  lung base felt to most likely represent scarring.  Follow-up chest CT scan on 06/02/2010 showed parenchymal scarring in the left apex, left  lower lobe, and lingula; some of this scarring at the left base had a nodular appearance, unchanged. Chest 09/2012 - stable.  . 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  Thyroid scan 08/28/2009 was normal with no focal areas of abnormal increased or decreased activity seen; the uptake of I 131 sodium iodide at 24 hours was 5.7%.  TSH and free T4 normalized by 08/16/2009.  . Intestinal obstruction ([place])     . COPD (chronic obstructive pulmonary disease) ([place])   . E coli bacteremia   . DVT (deep venous thrombosis) ([place])     He has hypercoagulability with multiple prior DVTs  . Pleural plaque     H/o asbestos exposure. Chest CT on 06/02/2010 showed stable extensive calcified pleural plaques involving the left hemithorax, consistent with asbestos related pleural disease.  . Weight loss     Normal colonoscopy by Dr. Olevia Perches on 11/06/2010.  . 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 b. St. Jude gen change 2010.  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  . Noncompliance   . Thrombocytopenia ([place])   . Osteoarthritis   . Spondylosis   . Anxiety   . CKD (chronic kidney disease)     Renal U/S 12/04/2009 showed no pathological findings. Labs 12/04/2009 include normal ESR, C3, C4; neg ANA; SPEP showed nonspecific increase in the alpha-2 region with no M-spike; UPEP showed no monoclonal free light chains; urine IFE showed polyclonal increase in feree Kappa and/or free Lambda light chains. Baseline Cr reported 1.7-2.5.  . STEMI (ST elevation myocardial infarction) ([place]) 10/11/2014  . Hyperlipemia   . HCAP (healthcare-associated pneumonia) 08/24/2015  . On home oxygen therapy     "3L periodically" (08/24/2015)  . CHF (congestive heart failure) ([place])    Review of Systems: Review of Systems  Constitutional: Negative for weight loss and malaise/fatigue.  Respiratory: Negative for cough and shortness of breath.   Cardiovascular: Negative for chest pain and palpitations.  Gastrointestinal: Negative for nausea, vomiting, abdominal pain, diarrhea and constipation.       Decreased appetite  Genitourinary:       Erectile dysfunction  Neurological: Negative for loss of consciousness.    Physical Exam: Filed Vitals:   11/15/15 1551  BP: 111/83  Pulse: 59  Temp:  97.5 F (36.4 C)  TempSrc: Oral  Height: '6\' 3"'  (1.905 m)  Weight: 157 lb 12.8 oz (71.578 kg)  SpO2: 100%   Physical Exam  Constitutional: He is oriented to person, place, and time. No distress.  Thin man, sitting in wheelchair, pleasant  Cardiovascular:  Irregularly irregular, slight bradycardia  Pulmonary/Chest: Effort normal. No respiratory distress. He has no wheezes. He has no rales.  Musculoskeletal: He exhibits no edema or tenderness.  Neurological: He is alert and oriented to person, place, and time.  Skin: He is not diaphoretic.  Psychiatric: He has a normal mood and affect.    Assessment & Plan:  See encounters tab for problem based medical decision making. Patient seen with Dr. Beryle Beams.

## 2015-11-15 NOTE — Patient Instructions (Signed)
Please stop taking the 30 mg Diltiazem. Continue the 120 mg 24 hour Diltiazem.  Please stop the Megace. I will prescribe Marinol for your appetite.  Please continue your other medications as prescribed.  I will refer you to Urology for your erectile dysfunction.  Please follow up with the lung, heart, and kidney doctors.

## 2015-11-16 NOTE — Assessment & Plan Note (Addendum)
A/P: Patient with persistent Afib. He takes Metoprolol XL 100 mg and Diltiazem XR 120 mg daily with prn Diltiazem 30 mg q6h. He says he felt lightheaded and unwell recently when he took a prn 30 mg Diltiazem. Heart rate is 59 on arrival, irregularly irregular with normal rate on exam. -Continue Metoprolol XL 100 mg and Diltiazem XR 120 mg daily -Discontinue prn Diltiazem 30 mg q6h -Continue Eliquis 5 mg BID; monitor renal function -f/u with Cardiology for previously proposed MAZE procedure

## 2015-11-17 NOTE — Assessment & Plan Note (Signed)
A/P: Patient 1 year history of erectile dysfunction and no morning erections. He does not have diabetes but has significant PAD. Do not feel that medical therapy with PDE5 inhibitors will work well with his current medications. Will refer to Urology for evaluation of prosthesis. -Urology referral

## 2015-11-17 NOTE — Assessment & Plan Note (Signed)
A/P: Patient reports continued decreased appetite. Was started on Megace for this which he does not feel is helpful. He has significant pulmonary and peripheral arterial disease. He says Prednisone has helped the most for his appetite and weight gain. He has had Marinol in the past which has also helped. -Start Marinol 2.5 mg BID; Can titrate up if tolerating -Stop Megace

## 2015-11-17 NOTE — Assessment & Plan Note (Signed)
CT chest on 09/27/2015 showed changes suspicious for asbestos related pleural disease, including extensive calcified pleural plaques, L > R. R > L pleural effusion was also seen and a thoracenteses was performed, yielding 400 mL fluid. Cytology revealed reactive mesothelial cells and numerous lymphocytes. He followed up with Pulmonology earlier this month. A repeat CXR did not show recurrence of his pleural effusion. PET scan on 6/28 did not show hypermetabolic masses or adenopathy, but did show bilateral calcified pleural plaques compatible with prior asbestos exposure.  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  PFTs on 6/28 show an FEV1 55% predicted with 67% post-bronchodilator and FEV1/FVC ratio of 76% with response to bronchodilators.   A/P: Patient with mixed obstructive and restrictive disease. Pleural effusion has cleared. Lungs are clear to auscultation this visit. He has been faring well after his many recent admissions. Seems to do better at home than going to SNFs after hospitalization. -Continue Spiriva and Albuterol -f/u with Pulmonology as scheduled

## 2015-11-17 NOTE — Assessment & Plan Note (Signed)
A/P: Patient feels well on his current medication regimen with 1-2 episodes of shortness of breath when walking from the end of his driveway to his front door. His weight is stable at 157 lbs compared to 158 last month. He has just resumed his Metoprolol 100 mg 24h after he was out of this medication for a few days. He is off of Digoxin now. -Continue Metoprolol 100 mg 24h daily -Continue Lasix 40 mg daily -Repeat BMP on follow up

## 2015-11-19 DIAGNOSIS — J449 Chronic obstructive pulmonary disease, unspecified: Secondary | ICD-10-CM | POA: Diagnosis not present

## 2015-11-19 DIAGNOSIS — I4891 Unspecified atrial fibrillation: Secondary | ICD-10-CM | POA: Diagnosis not present

## 2015-11-19 DIAGNOSIS — N19 Unspecified kidney failure: Secondary | ICD-10-CM | POA: Diagnosis not present

## 2015-11-19 DIAGNOSIS — J129 Viral pneumonia, unspecified: Secondary | ICD-10-CM | POA: Diagnosis not present

## 2015-11-24 ENCOUNTER — Other Ambulatory Visit: Payer: Self-pay | Admitting: *Deleted

## 2015-11-24 NOTE — Patient Outreach (Signed)
Call received from administrative assistant stating that member called concerning his meals on wheels.  He states that it expired on 7/3, but would like it to continue.  Call placed to member to address concerns, he state that he is currently on the line with a doctor's office and that he will call this care manager back.  Will await call back, but will also provide update to assigned care manager upon her return.  [name], [place], MSN [place] [phone]

## 2015-11-27 ENCOUNTER — Encounter: Payer: Commercial Managed Care - HMO | Admitting: Internal Medicine

## 2015-11-27 DIAGNOSIS — R0989 Other specified symptoms and signs involving the circulatory and respiratory systems: Secondary | ICD-10-CM

## 2015-11-28 ENCOUNTER — Encounter: Payer: Self-pay | Admitting: Internal Medicine

## 2015-12-06 ENCOUNTER — Other Ambulatory Visit: Payer: Self-pay

## 2015-12-06 NOTE — Telephone Encounter (Signed)
Requesting pain med to be filled.  

## 2015-12-07 NOTE — Telephone Encounter (Signed)
Pt aware script is ready

## 2015-12-09 DIAGNOSIS — I5022 Chronic systolic (congestive) heart failure: Secondary | ICD-10-CM | POA: Diagnosis not present

## 2015-12-09 DIAGNOSIS — I4891 Unspecified atrial fibrillation: Secondary | ICD-10-CM | POA: Diagnosis not present

## 2015-12-09 DIAGNOSIS — M6281 Muscle weakness (generalized): Secondary | ICD-10-CM | POA: Diagnosis not present

## 2015-12-09 DIAGNOSIS — R2681 Unsteadiness on feet: Secondary | ICD-10-CM | POA: Diagnosis not present

## 2015-12-09 DIAGNOSIS — N19 Unspecified kidney failure: Secondary | ICD-10-CM | POA: Diagnosis not present

## 2015-12-09 DIAGNOSIS — J441 Chronic obstructive pulmonary disease with (acute) exacerbation: Secondary | ICD-10-CM | POA: Diagnosis not present

## 2015-12-09 DIAGNOSIS — J129 Viral pneumonia, unspecified: Secondary | ICD-10-CM | POA: Diagnosis not present

## 2015-12-09 DIAGNOSIS — J449 Chronic obstructive pulmonary disease, unspecified: Secondary | ICD-10-CM | POA: Diagnosis not present

## 2015-12-14 DIAGNOSIS — J449 Chronic obstructive pulmonary disease, unspecified: Secondary | ICD-10-CM | POA: Diagnosis not present

## 2015-12-14 DIAGNOSIS — N19 Unspecified kidney failure: Secondary | ICD-10-CM | POA: Diagnosis not present

## 2015-12-14 DIAGNOSIS — J441 Chronic obstructive pulmonary disease with (acute) exacerbation: Secondary | ICD-10-CM | POA: Diagnosis not present

## 2015-12-14 DIAGNOSIS — I4891 Unspecified atrial fibrillation: Secondary | ICD-10-CM | POA: Diagnosis not present

## 2015-12-14 DIAGNOSIS — R2681 Unsteadiness on feet: Secondary | ICD-10-CM | POA: Diagnosis not present

## 2015-12-14 DIAGNOSIS — J129 Viral pneumonia, unspecified: Secondary | ICD-10-CM | POA: Diagnosis not present

## 2015-12-14 DIAGNOSIS — M6281 Muscle weakness (generalized): Secondary | ICD-10-CM | POA: Diagnosis not present

## 2015-12-14 DIAGNOSIS — I5022 Chronic systolic (congestive) heart failure: Secondary | ICD-10-CM | POA: Diagnosis not present

## 2015-12-15 ENCOUNTER — Emergency Department (HOSPITAL_COMMUNITY): Payer: PRIVATE HEALTH INSURANCE

## 2015-12-15 ENCOUNTER — Other Ambulatory Visit: Payer: Self-pay | Admitting: *Deleted

## 2015-12-15 ENCOUNTER — Inpatient Hospital Stay (HOSPITAL_COMMUNITY)
Admission: EM | Admit: 2015-12-15 | Discharge: 2015-12-18 | DRG: 885 | Disposition: A | Discharge status: Home Health | Payer: PRIVATE HEALTH INSURANCE | Attending: Internal Medicine | Admitting: Internal Medicine

## 2015-12-15 ENCOUNTER — Telehealth: Payer: Self-pay | Admitting: *Deleted

## 2015-12-15 ENCOUNTER — Encounter (HOSPITAL_COMMUNITY): Payer: Self-pay | Admitting: Emergency Medicine

## 2015-12-15 VITALS — BP 118/62 | HR 113 | Resp 20 | Ht 75.0 in

## 2015-12-15 DIAGNOSIS — E0590 Thyrotoxicosis, unspecified without thyrotoxic crisis or storm: Secondary | ICD-10-CM | POA: Diagnosis present

## 2015-12-15 DIAGNOSIS — J920 Pleural plaque with presence of asbestos: Secondary | ICD-10-CM | POA: Diagnosis present

## 2015-12-15 DIAGNOSIS — J441 Chronic obstructive pulmonary disease with (acute) exacerbation: Secondary | ICD-10-CM | POA: Diagnosis not present

## 2015-12-15 DIAGNOSIS — J449 Chronic obstructive pulmonary disease, unspecified: Secondary | ICD-10-CM | POA: Diagnosis not present

## 2015-12-15 DIAGNOSIS — E875 Hyperkalemia: Secondary | ICD-10-CM | POA: Diagnosis not present

## 2015-12-15 DIAGNOSIS — I252 Old myocardial infarction: Secondary | ICD-10-CM

## 2015-12-15 DIAGNOSIS — F339 Major depressive disorder, recurrent, unspecified: Secondary | ICD-10-CM

## 2015-12-15 DIAGNOSIS — Z1629 Resistance to other single specified antibiotic: Secondary | ICD-10-CM | POA: Diagnosis not present

## 2015-12-15 DIAGNOSIS — J90 Pleural effusion, not elsewhere classified: Secondary | ICD-10-CM | POA: Diagnosis present

## 2015-12-15 DIAGNOSIS — N179 Acute kidney failure, unspecified: Secondary | ICD-10-CM | POA: Diagnosis not present

## 2015-12-15 DIAGNOSIS — Z888 Allergy status to other drugs, medicaments and biological substances status: Secondary | ICD-10-CM

## 2015-12-15 DIAGNOSIS — I130 Hypertensive heart and chronic kidney disease with heart failure and stage 1 through stage 4 chronic kidney disease, or unspecified chronic kidney disease: Secondary | ICD-10-CM | POA: Diagnosis present

## 2015-12-15 DIAGNOSIS — Z23 Encounter for immunization: Secondary | ICD-10-CM

## 2015-12-15 DIAGNOSIS — Z8249 Family history of ischemic heart disease and other diseases of the circulatory system: Secondary | ICD-10-CM

## 2015-12-15 DIAGNOSIS — I509 Heart failure, unspecified: Secondary | ICD-10-CM

## 2015-12-15 DIAGNOSIS — I8290 Acute embolism and thrombosis of unspecified vein: Secondary | ICD-10-CM | POA: Diagnosis not present

## 2015-12-15 DIAGNOSIS — E785 Hyperlipidemia, unspecified: Secondary | ICD-10-CM | POA: Diagnosis present

## 2015-12-15 DIAGNOSIS — I110 Hypertensive heart disease with heart failure: Secondary | ICD-10-CM

## 2015-12-15 DIAGNOSIS — E860 Dehydration: Secondary | ICD-10-CM

## 2015-12-15 DIAGNOSIS — I4891 Unspecified atrial fibrillation: Secondary | ICD-10-CM | POA: Diagnosis present

## 2015-12-15 DIAGNOSIS — R531 Weakness: Secondary | ICD-10-CM

## 2015-12-15 DIAGNOSIS — R159 Full incontinence of feces: Secondary | ICD-10-CM | POA: Diagnosis present

## 2015-12-15 DIAGNOSIS — F329 Major depressive disorder, single episode, unspecified: Secondary | ICD-10-CM | POA: Diagnosis not present

## 2015-12-15 DIAGNOSIS — B9620 Unspecified Escherichia coli [E. coli] as the cause of diseases classified elsewhere: Secondary | ICD-10-CM | POA: Diagnosis present

## 2015-12-15 DIAGNOSIS — K625 Hemorrhage of anus and rectum: Secondary | ICD-10-CM | POA: Diagnosis present

## 2015-12-15 DIAGNOSIS — Z79899 Other long term (current) drug therapy: Secondary | ICD-10-CM

## 2015-12-15 DIAGNOSIS — N189 Chronic kidney disease, unspecified: Secondary | ICD-10-CM

## 2015-12-15 DIAGNOSIS — Z87891 Personal history of nicotine dependence: Secondary | ICD-10-CM

## 2015-12-15 DIAGNOSIS — R0602 Shortness of breath: Secondary | ICD-10-CM | POA: Diagnosis not present

## 2015-12-15 DIAGNOSIS — Z7982 Long term (current) use of aspirin: Secondary | ICD-10-CM

## 2015-12-15 DIAGNOSIS — R627 Adult failure to thrive: Secondary | ICD-10-CM | POA: Diagnosis present

## 2015-12-15 DIAGNOSIS — Z9981 Dependence on supplemental oxygen: Secondary | ICD-10-CM

## 2015-12-15 DIAGNOSIS — R404 Transient alteration of awareness: Secondary | ICD-10-CM | POA: Diagnosis not present

## 2015-12-15 DIAGNOSIS — N390 Urinary tract infection, site not specified: Secondary | ICD-10-CM | POA: Diagnosis present

## 2015-12-15 DIAGNOSIS — I2510 Atherosclerotic heart disease of native coronary artery without angina pectoris: Secondary | ICD-10-CM | POA: Diagnosis not present

## 2015-12-15 DIAGNOSIS — I5022 Chronic systolic (congestive) heart failure: Secondary | ICD-10-CM

## 2015-12-15 DIAGNOSIS — N3090 Cystitis, unspecified without hematuria: Secondary | ICD-10-CM

## 2015-12-15 DIAGNOSIS — E559 Vitamin D deficiency, unspecified: Secondary | ICD-10-CM | POA: Diagnosis present

## 2015-12-15 DIAGNOSIS — I5042 Chronic combined systolic (congestive) and diastolic (congestive) heart failure: Secondary | ICD-10-CM | POA: Diagnosis not present

## 2015-12-15 DIAGNOSIS — I4811 Longstanding persistent atrial fibrillation: Secondary | ICD-10-CM | POA: Diagnosis present

## 2015-12-15 DIAGNOSIS — K219 Gastro-esophageal reflux disease without esophagitis: Secondary | ICD-10-CM | POA: Diagnosis not present

## 2015-12-15 DIAGNOSIS — Z86718 Personal history of other venous thrombosis and embolism: Secondary | ICD-10-CM

## 2015-12-15 DIAGNOSIS — N183 Chronic kidney disease, stage 3 (moderate): Secondary | ICD-10-CM | POA: Diagnosis present

## 2015-12-15 DIAGNOSIS — R300 Dysuria: Secondary | ICD-10-CM

## 2015-12-15 DIAGNOSIS — Z96643 Presence of artificial hip joint, bilateral: Secondary | ICD-10-CM | POA: Diagnosis present

## 2015-12-15 DIAGNOSIS — Z9119 Patient's noncompliance with other medical treatment and regimen: Secondary | ICD-10-CM

## 2015-12-15 DIAGNOSIS — Z880 Allergy status to penicillin: Secondary | ICD-10-CM

## 2015-12-15 LAB — COMPREHENSIVE METABOLIC PANEL
ALK PHOS: 58 U/L (ref 38–126)
ALT: 14 U/L — ABNORMAL LOW (ref 17–63)
ANION GAP: 5 (ref 5–15)
AST: 22 U/L (ref 15–41)
Albumin: 3.4 g/dL — ABNORMAL LOW (ref 3.5–5.0)
BILIRUBIN TOTAL: 0.7 mg/dL (ref 0.3–1.2)
BUN: 55 mg/dL — ABNORMAL HIGH (ref 6–20)
CALCIUM: 9.4 mg/dL (ref 8.9–10.3)
CO2: 24 mmol/L (ref 22–32)
Chloride: 109 mmol/L (ref 101–111)
Creatinine, Ser: 2.36 mg/dL — ABNORMAL HIGH (ref 0.61–1.24)
GFR calc non Af Amer: 26 mL/min — ABNORMAL LOW (ref >60)
GFR, EST AFRICAN AMERICAN: 30 mL/min — AB (ref >60)
Glucose, Bld: 98 mg/dL (ref 65–99)
Potassium: 5.8 mmol/L — ABNORMAL HIGH (ref 3.5–5.1)
SODIUM: 138 mmol/L (ref 135–145)
TOTAL PROTEIN: 6.2 g/dL — AB (ref 6.5–8.1)

## 2015-12-15 LAB — CBC WITH DIFFERENTIAL/PLATELET
BASOS ABS: 0 10*3/uL (ref 0.0–0.1)
BASOS PCT: 0 %
Eosinophils Absolute: 0.3 10*3/uL (ref 0.0–0.7)
Eosinophils Relative: 4 %
HEMATOCRIT: 44.8 % (ref 39.0–52.0)
HEMOGLOBIN: 14.1 g/dL (ref 13.0–17.0)
Lymphocytes Relative: 20 %
Lymphs Abs: 1.7 10*3/uL (ref 0.7–4.0)
MCH: 29.2 pg (ref 26.0–34.0)
MCHC: 31.5 g/dL (ref 30.0–36.0)
MCV: 92.8 fL (ref 78.0–100.0)
Monocytes Absolute: 0.6 10*3/uL (ref 0.1–1.0)
Monocytes Relative: 8 %
NEUTROS ABS: 5.7 10*3/uL (ref 1.7–7.7)
NEUTROS PCT: 68 %
PLATELETS: 148 10*3/uL — AB (ref 150–400)
RBC: 4.83 MIL/uL (ref 4.22–5.81)
RDW: 15.2 % (ref 11.5–15.5)
WBC: 8.4 10*3/uL (ref 4.0–10.5)

## 2015-12-15 LAB — TROPONIN I

## 2015-12-15 LAB — CBG MONITORING, ED: Glucose-Capillary: 111 mg/dL — ABNORMAL HIGH (ref 65–99)

## 2015-12-15 LAB — BRAIN NATRIURETIC PEPTIDE: B Natriuretic Peptide: 231.3 pg/mL — ABNORMAL HIGH (ref 0.0–100.0)

## 2015-12-15 MED ORDER — METOPROLOL SUCCINATE ER 25 MG PO TB24
100.0000 mg | ORAL_TABLET | Freq: Every day | ORAL | Status: DC
  Administered 2015-12-16 – 2015-12-18 (×3): 100 mg via ORAL
  Filled 2015-12-15 (×3): qty 4

## 2015-12-15 MED ORDER — ACETAMINOPHEN 325 MG PO TABS: 650.0000 mg | ORAL_TABLET | Freq: Four times a day (QID) | ORAL | Status: DC | PRN

## 2015-12-15 MED ORDER — SODIUM CHLORIDE 0.9 % IV BOLUS (SEPSIS)
1000.0000 mL | Freq: Once | INTRAVENOUS | Status: AC
  Administered 2015-12-15: 1000 mL via INTRAVENOUS

## 2015-12-15 MED ORDER — DILTIAZEM HCL ER COATED BEADS 120 MG PO CP24
120.0000 mg | ORAL_CAPSULE | Freq: Every day | ORAL | Status: DC
Start: 2015-12-16 — End: 2015-12-16

## 2015-12-15 MED ORDER — OXYCODONE-ACETAMINOPHEN 10-325 MG PO TABS: 1.0000 | ORAL_TABLET | ORAL | Status: DC | PRN

## 2015-12-15 MED ORDER — MIRTAZAPINE 15 MG PO TABS
15.0000 mg | ORAL_TABLET | Freq: Every day | ORAL | Status: DC
  Administered 2015-12-15: 15 mg via ORAL
  Filled 2015-12-15: qty 1

## 2015-12-15 MED ORDER — ALBUTEROL SULFATE (2.5 MG/3ML) 0.083% IN NEBU: 2.5000 mg | INHALATION_SOLUTION | Freq: Four times a day (QID) | RESPIRATORY_TRACT | Status: DC | PRN

## 2015-12-15 MED ORDER — APIXABAN 5 MG PO TABS
5.0000 mg | ORAL_TABLET | Freq: Two times a day (BID) | ORAL | Status: DC
  Administered 2015-12-15 – 2015-12-18 (×6): 5 mg via ORAL
  Filled 2015-12-15 (×6): qty 1

## 2015-12-15 MED ORDER — OXYCODONE-ACETAMINOPHEN 5-325 MG PO TABS
1.0000 | ORAL_TABLET | ORAL | Status: DC | PRN
  Administered 2015-12-15 – 2015-12-18 (×7): 1 via ORAL
  Filled 2015-12-15 (×7): qty 1

## 2015-12-15 MED ORDER — METHYLPREDNISOLONE SODIUM SUCC 125 MG IJ SOLR
125.0000 mg | Freq: Once | INTRAMUSCULAR | Status: AC
  Administered 2015-12-15: 125 mg via INTRAVENOUS
  Filled 2015-12-15: qty 2

## 2015-12-15 MED ORDER — TIOTROPIUM BROMIDE MONOHYDRATE 18 MCG IN CAPS
18.0000 ug | ORAL_CAPSULE | Freq: Every day | RESPIRATORY_TRACT | Status: DC
  Administered 2015-12-15 – 2015-12-18 (×4): 18 ug via RESPIRATORY_TRACT
  Filled 2015-12-15: qty 5

## 2015-12-15 MED ORDER — SENNOSIDES-DOCUSATE SODIUM 8.6-50 MG PO TABS
1.0000 | ORAL_TABLET | Freq: Every day | ORAL | Status: DC | PRN
  Administered 2015-12-17: 1 via ORAL
  Filled 2015-12-15: qty 1

## 2015-12-15 MED ORDER — SODIUM CHLORIDE 0.9 % IV SOLN
INTRAVENOUS | Status: DC
Start: 2015-12-15 — End: 2015-12-16
  Administered 2015-12-16: 01:00:00 via INTRAVENOUS

## 2015-12-15 MED ORDER — PANTOPRAZOLE SODIUM 40 MG PO TBEC
40.0000 mg | DELAYED_RELEASE_TABLET | Freq: Every day | ORAL | Status: DC
  Administered 2015-12-16 – 2015-12-18 (×3): 40 mg via ORAL
  Filled 2015-12-15 (×3): qty 1

## 2015-12-15 MED ORDER — ASPIRIN 81 MG PO CHEW
81.0000 mg | CHEWABLE_TABLET | Freq: Every day | ORAL | Status: DC
  Administered 2015-12-15 – 2015-12-18 (×4): 81 mg via ORAL
  Filled 2015-12-15 (×4): qty 1

## 2015-12-15 MED ORDER — BUPROPION HCL 75 MG PO TABS
75.0000 mg | ORAL_TABLET | Freq: Two times a day (BID) | ORAL | Status: DC
  Administered 2015-12-15 – 2015-12-18 (×6): 75 mg via ORAL
  Filled 2015-12-15 (×7): qty 1

## 2015-12-15 MED ORDER — VITAMIN D 1000 UNITS PO TABS
1000.0000 [IU] | ORAL_TABLET | Freq: Every day | ORAL | Status: DC
Start: 2015-12-16 — End: 2015-12-18
  Administered 2015-12-16 – 2015-12-18 (×3): 1000 [IU] via ORAL
  Filled 2015-12-15 (×3): qty 1

## 2015-12-15 MED ORDER — SODIUM CHLORIDE 0.9% FLUSH
3.0000 mL | Freq: Two times a day (BID) | INTRAVENOUS | Status: DC
  Administered 2015-12-15 – 2015-12-18 (×6): 3 mL via INTRAVENOUS

## 2015-12-15 MED ORDER — ALBUTEROL SULFATE (2.5 MG/3ML) 0.083% IN NEBU
5.0000 mg | INHALATION_SOLUTION | Freq: Once | RESPIRATORY_TRACT | Status: AC
  Administered 2015-12-15: 5 mg via RESPIRATORY_TRACT
  Filled 2015-12-15: qty 6

## 2015-12-15 MED ORDER — OXYCODONE HCL 5 MG PO TABS
5.0000 mg | ORAL_TABLET | ORAL | Status: DC | PRN
  Administered 2015-12-15 – 2015-12-18 (×6): 5 mg via ORAL
  Filled 2015-12-15 (×6): qty 1

## 2015-12-15 MED ORDER — ACETAMINOPHEN 650 MG RE SUPP: 650.0000 mg | Freq: Four times a day (QID) | RECTAL | Status: DC | PRN

## 2015-12-15 MED ORDER — ROSUVASTATIN CALCIUM 20 MG PO TABS
20.0000 mg | ORAL_TABLET | Freq: Every day | ORAL | Status: DC
  Administered 2015-12-15 – 2015-12-17 (×3): 20 mg via ORAL
  Filled 2015-12-15 (×3): qty 1

## 2015-12-15 NOTE — ED Notes (Signed)
Attempted to call report. RN unavailable due to pt care 

## 2015-12-15 NOTE — ED Provider Notes (Signed)
[name]  1609  First Provider Contact:  First MD Initiated Contact with Patient 12/15/15 1629        History   Chief Complaint Chief Complaint  Patient presents with  . Shortness of Breath  . Weakness    HPI [name] is a 70 y.o. male. Patient with multiple medical issues presents with concern of weakness, dyspnea. Patient acknowledges medication noncompliance, as he states that he feels weaker than usual, and once occurs, he typically stops taking his medicine reliably. He denies focal pain, syncope, syncope, nausea, vomiting, fever. He has not been using any recent albuterol therapy for COPD. HPI  Past Medical History:  Diagnosis Date  . Alcohol abuse   . Anxiety   . Avascular necrosis of bones of both hips (HCC)    Status post bilateral hip replacements  . Bipolar disorder ([place])   . CHF (congestive heart failure) ([place])   . Chronic systolic heart failure ([place])    a. NICM - EF 35% with normal cors 2003. b. Last echo 11/2012 - EF 25-30%.  . CKD (chronic kidney disease)    Renal U/S 12/04/2009 showed no pathological findings. Labs 12/04/2009 include normal ESR, C3, C4; neg ANA; SPEP showed nonspecific increase in the alpha-2 region with no M-spike; UPEP showed no monoclonal free light chains; urine IFE showed polyclonal increase in feree Kappa and/or free Lambda light chains. Baseline Cr reported 1.7-2.5.  [name] COPD (chronic obstructive pulmonary disease) ([place])   . Depression   . DVT (deep venous thrombosis) ([place])    He has hypercoagulability with multiple prior DVTs  . E coli bacteremia   . Erectile dysfunction   . Gastritis   . GERD (gastroesophageal reflux disease)   . HCAP (healthcare-associated pneumonia) 08/24/2015  . Hydrocele, unspecified   . Hyperlipemia   . Hypertension   . 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  Thyroid scan 08/28/2009 was  normal with no focal areas of abnormal increased or decreased activity seen; the uptake of I 131 sodium iodide at 24 hours was 5.7%.  TSH and free T4 normalized by 08/16/2009.  . Intestinal obstruction ([place])   . Lung nodule    Chest CT scan on 12/14/2008 showed a nodular opacity at the left lung base felt to most likely represent scarring.  Follow-up chest CT scan on 06/02/2010 showed parenchymal scarring in the left apex, left  lower lobe, and lingula; some of this scarring at the left base had a nodular appearance, unchanged. Chest 09/2012 - stable.  . Noncompliance   . On home oxygen therapy    "3L periodically" (08/24/2015)  . Osteoarthritis   . Pleural plaque    H/o asbestos exposure. Chest CT on 06/02/2010 showed stable extensive calcified pleural plaques involving the left hemithorax, consistent with asbestos related pleural disease.  . Portal vein thrombosis   . Spondylosis   . STEMI (ST elevation myocardial infarction) ([employer]) 10/11/2014  . 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 b. St. Jude gen change 2010.  [name] Thrombocytopenia ([place])   . 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  . Weight loss    Normal colonoscopy by Dr. Olevia Perches on 11/06/2010.    Patient Active Problem List   Diagnosis Date Noted  . Hyperkalemia 12/15/2015  . Hydrocele of testis   .  Asbestosis ([place])   . CKD (chronic kidney disease)   . Coagulopathy ([place])   . Long term current use of opiate analgesic 06/27/2015  . Decreased appetite 06/19/2015  . Neuropathic pain of both feet ([place]) 05/03/2015  . Chronic pain 12/13/2014  . Longstanding persistent atrial fibrillation ([place]) 10/11/2014  . TIA (transient ischemic attack) 10/11/2014  . Healthcare maintenance 01/25/2014  . BPH (benign prostatic hyperplasia) 01/20/2014  . PVD (peripheral vascular disease) ([place]) 12/10/2013  .  Chronic combined systolic and diastolic congestive heart failure ([place]) 06/20/2010  . Long-term (current) use of anticoagulants 06/05/2010  . H/O Arterial embolism of leg  06/05/2010  . Depression 01/19/2010  . Chronic kidney disease (CKD), stage III (moderate) 11/18/2009  . History of hyperthyroidism 03/13/2009  . Bilateral hip pain 03/23/2007  . OSTEOARTHRITIS 09/05/2006  . G E R D 09/04/2006  . COPD (chronic obstructive pulmonary disease) ([place]) 08/28/2006  . ERECTILE DYSFUNCTION 03/01/2006  . History of tobacco abuse 03/01/2006  . Essential hypertension 03/01/2006  . Cardiac pacemaker in situ 03/01/2006    Past Surgical History:  Procedure Laterality Date  . CARDIAC CATHETERIZATION  2003   Nl cors, EF 35%  . CARDIOVERSION N/A 05/27/2013   Procedure: CARDIOVERSION;  Surgeon: Candee Furbish, MD;  Location: [facility] ENDOSCOPY;  Service: Cardiovascular;  Laterality: N/A;  . EMBOLECTOMY Right 12/10/2013   Procedure: Right Femoral Embolectomy; Fasciotomy Right Lower Leg with Intraoperative arteriogram;  Surgeon: Rosetta Posner, MD;  Location: [place];  Service: Vascular;  Laterality: Right;  . EMBOLECTOMY Left 10/12/2014   Procedure: Left Femoral Thrombectomy with intraoperative arteriogram;  Surgeon: Rosetta Posner, MD;  Location: [place];  Service: Vascular;  Laterality: Left;  . FASCIOTOMY CLOSURE Right 12/15/2013   Procedure: LEG FASCIOTOMY CLOSURE;  Surgeon: Rosetta Posner, MD;  Location: [place];  Service: Vascular;  Laterality: Right;  . HEMIARTHROPLASTY HIP Left 09/09/2002   bipolar; Dr. Lafayette Dragon  . INSERT / REPLACE / REMOVE PACEMAKER  02/2003   Dr. Roe Rutherford, [employer] pulse generator identity SR model [id number] serial [id number]; gen change by Greggory Brandy  . INSERT / REPLACE / REMOVE PACEMAKER  06/17/2008   Lead and generator change w/ [employer] Medical Tendril ST model [id number] (serial  [id number]).       . INTRAOPERATIVE ARTERIOGRAM Right 12/10/2013   Procedure: INTRA OPERATIVE ARTERIOGRAM;  Surgeon:  Rosetta Posner, MD;  Location: [place];  Service: Vascular;  Laterality: Right;  . JOINT REPLACEMENT    . TEE WITHOUT CARDIOVERSION N/A 05/27/2013   Procedure: TRANSESOPHAGEAL ECHOCARDIOGRAM (TEE);  Surgeon: Candee Furbish, MD;  Location: [facility] ENDOSCOPY;  Service: Cardiovascular;  Laterality: N/A;  . TOTAL HIP ARTHROPLASTY Right 03/08/2008    By Dr. Hiram Comber       Home Medications    Prior to Admission medications   Medication Sig Start Date End Date Taking? Authorizing Provider  albuterol (VENTOLIN HFA) 108 (90 BASE) MCG/ACT inhaler Inhale 2 puffs into the lungs every 6 (six) hours as needed for wheezing or shortness of breath. 01/06/15   Nischal Dareen Piano, MD  apixaban (ELIQUIS) 5 MG TABS tablet Take 1 tablet (5 mg total) by mouth 2 (two) times daily. 10/03/15   Norval Gable, MD  aspirin 81 MG chewable tablet Chew 1 tablet (81 mg total) by mouth daily. 11/17/14   Riccardo Dubin, MD  buPROPion (WELLBUTRIN) 75 MG tablet Take 1 tablet (75 mg total) by mouth 2 (two) times daily. 10/08/15   Norval Gable, MD  cholecalciferol (VITAMIN D) 1000 units tablet Take 1 tablet (1,000 Units total) by mouth daily. 10/08/15   Norval Gable, MD  diltiazem (CARDIZEM CD) 120 MG 24 hr capsule Take 1 capsule (120 mg total) by mouth daily. 08/19/15   Zada Finders, MD  dronabinol (MARINOL) 2.5 MG capsule Take 1 capsule (2.5 mg total) by mouth 2 (two) times daily before lunch and supper. 11/15/15   Zada Finders, MD  furosemide (LASIX) 40 MG tablet Take 1 tablet (40 mg total) by mouth daily. 10/03/15   Norval Gable, MD  metoprolol succinate (TOPROL-XL) 100 MG 24 hr tablet Take 100 mg by mouth daily. Take with or immediately following a meal.    Historical Provider, MD  mirtazapine (REMERON) 15 MG tablet TAKE 1 TABLET BY MOUTH AT BEDTIME 07/25/15   Zada Finders, MD  Multiple Vitamin (MULTIVITAMIN WITH MINERALS) TABS tablet Take 1 tablet by mouth daily. Reported on 10/11/2015    Historical Provider, MD  omeprazole  (PRILOSEC) 40 MG capsule Take 1 capsule (40 mg total) by mouth daily. 10/08/15   Norval Gable, MD  oxyCODONE-acetaminophen (PERCOCET) 10-325 MG tablet Take 1 tablet by mouth every 4 (four) hours as needed for pain. Do not take more than 5 tablets in 24 hours. 11/07/15 11/05/16  Zada Finders, MD  rosuvastatin (CRESTOR) 20 MG tablet Take 1 tablet (20 mg total) by mouth daily at 6 PM. 10/08/15   Norval Gable, MD  senna-docusate ([employer] SENNA PLUS) 8.6-50 MG tablet Take 1 tablet by mouth daily as needed for moderate constipation. 08/08/15   Zada Finders, MD  tiotropium (SPIRIVA) 18 MCG inhalation capsule Place 1 capsule (18 mcg total) into inhaler and inhale daily. 01/06/15   Aldine Contes, MD    Family History Family History  Problem Relation Age of Onset  . Hypertension Mother   . Asthma Mother   . Coronary artery disease Mother   . Cancer Mother   . Cancer Brother     Unsure of type.  . Stroke      mother  . Heart attack      brother and sister ?age   . Heart disease Brother   . Colon cancer Neg Hx   . Lung cancer Neg Hx   . Prostate cancer Neg Hx     Social History Social History  Substance Use Topics  . Smoking status: Former Smoker    Packs/day: 0.33    Years: 54.00    Types: Cigarettes    Quit date: 05/21/2015  . Smokeless tobacco: Never Used  . Alcohol use No     Comment: 6/22//2016 "h/o etoh abuse; stopped drinking ~ 2012"     Allergies   Amiodarone and Penicillins   Review of Systems Review of Systems  Constitutional:       Per HPI, otherwise negative  HENT:       Per HPI, otherwise negative  Respiratory:       Per HPI, otherwise negative  Cardiovascular:       Per HPI, otherwise negative  Gastrointestinal: Negative for vomiting.  Endocrine:       Negative aside from HPI  Genitourinary:       Neg aside from HPI   Musculoskeletal:       Per HPI, otherwise negative  Skin: Negative.   Neurological: Positive for weakness. Negative for syncope.      Physical Exam Updated Vital Signs BP 93/75   Pulse (!) 49   Temp 98.4 F (36.9 C) (  Oral)   Resp 12   SpO2 99%   Physical Exam  Constitutional: He is oriented to person, place, and time. He appears well-developed. No distress.  HENT:  Head: Normocephalic and atraumatic.  Eyes: Conjunctivae and EOM are normal.  Cardiovascular: Normal rate and regular rhythm.   Pulmonary/Chest: No stridor.  Decreased BS throughout  Abdominal: He exhibits no distension.  Musculoskeletal: He exhibits no edema.  Neurological: He is alert and oriented to person, place, and time.  Skin: Skin is warm and dry.  Psychiatric: He has a normal mood and affect.  Nursing note and vitals reviewed.    ED Treatments / Results  Labs (all labs ordered are listed, but only abnormal results are displayed) Labs Reviewed  COMPREHENSIVE METABOLIC PANEL - Abnormal; Notable for the following:       Result Value   Potassium 5.8 (*)    BUN 55 (*)    Creatinine, Ser 2.36 (*)    Total Protein 6.2 (*)    Albumin 3.4 (*)    ALT 14 (*)    GFR calc non Af Amer 26 (*)    GFR calc Af Amer 30 (*)    All other components within normal limits  CBC WITH DIFFERENTIAL/PLATELET - Abnormal; Notable for the following:    Platelets 148 (*)    All other components within normal limits  BRAIN NATRIURETIC PEPTIDE - Abnormal; Notable for the following:    B Natriuretic Peptide 231.3 (*)    All other components within normal limits  CBG MONITORING, ED - Abnormal; Notable for the following:    Glucose-Capillary 111 (*)    All other components within normal limits  TROPONIN I    EKG  EKG Interpretation  Date/Time:  Friday December 15 2015 16:20:51 EDT Ventricular Rate:  124 PR Interval:    QRS Duration: 101 QT Interval:  329 QTC Calculation: 469 R Axis:   63 Text Interpretation:  Atrial fibrillation ST-t wave abnormality Abnormal ekg Confirmed by Carmin Muskrat  MD [phone]) on 12/15/2015 4:58:31 PM        Radiology Dg Chest 2 View  Result Date: 12/15/2015 CLINICAL DATA:  Shortness of breath, weakness for 3 weeks EXAM: CHEST  2 VIEW COMPARISON:  10/19/2015.  Chest CT 10/02/2015 FINDINGS: Moderate left pleural effusion versus pleural thickening on the left. Chronic changes in the left lung I was scarring. No confluent opacity on the right. Heart is upper limits normal in size. Right pacer is unchanged. IMPRESSION: Chronic changes on the left. Moderate left pleural effusion versus pleural thickening. Electronically Signed   By: Rolm Baptise M.D.   On: 12/15/2015 18:18   Procedures Procedures (including critical care time)  Medications Ordered in ED Medications  albuterol (PROVENTIL) (2.5 MG/3ML) 0.083% nebulizer solution 5 mg (not administered)  methylPREDNISolone sodium succinate (SOLU-MEDROL) 125 mg/2 mL injection 125 mg (not administered)  sodium chloride 0.9 % bolus 1,000 mL (1,000 mLs Intravenous New Bag/Given [name] 1726)     Initial Impression / Assessment and Plan / ED Course  I have reviewed the triage vital signs and the nursing notes.  Pertinent labs & imaging results that were available during my care of the patient were reviewed by me and considered in my medical decision making (see chart for details).  After the initial evaluation the patient was found to have decreasing blood pressure. Patient received fluid bolus, with subsequent improvement. After delay, the patient's initial labs notable for hyperkalemia, worsening renal failure. Patient received continue fluids, albuterol.  With some suspicion for multifactorial weakness, patient has received steroids, for possible COPD worsening.   Final Clinical Impressions(s) / ED Diagnoses  Hyperkalemia, weakness, COPD exacerbation, acute on chronic renal failure.  Patient with multiple medical issues includinhronic kidney disease, COPD, CHF presents with weakness. Here the patient is generally weak appearing, has  borderline low blood pressure, but is awake and alert.  However, the patient is found to have hyperkalemia, as well as acute worsening of his renal function. Patient received albuterol, steroids, fluids, with some improvement in his condition. Patient required admission for further evaluation and management.   New Prescriptions New Prescriptions   No medications on file     Carmin Muskrat, MD 12/15/15 2059

## 2015-12-15 NOTE — H&P (Signed)
Date: 12/15/2015               Patient Name:  [name] MRN: [id number]  DOB: February 09, 1946 Age / Sex: 70 y.o., male   PCP: Zada Finders, MD         Medical Service: Internal Medicine Teaching Service         Attending Physician: Dr. Oval Linsey, MD    First Contact: Dr. Philipp Ovens Pager: [phone]  Second Contact: Dr. Randell Patient Pager: [phone]       After Hours (After 5p/  First Contact Pager: [phone]  weekends / holidays): Second Contact Pager: [phone]   Chief Complaint: "I just haven't been feeling good for the last month."  History of Present Illness: [name] is a 70 year old man with a complicated medical history who was referred to the ED by [facility] nurse for failure to thrive, medication noncompliance, and depression.  He has COPD (on home O2), asbestos pleural disease, CHF, AFib (DOAC), CKD3, HTN, and depression.  About a month ago, he moved out of a home he had shared with his longtime friend/significant other and into an apartment alone.  Since that time, he reports depressed mood, poor sleep, lack of energy, and little interest in getting out of bed.  Has rarely left the house, minimal social interaction.  Has access to food but no appetite, negligible food intake.  Inconsistently taking all his meds due to the above.  No suicidal ideation, no guns at home.   Reports difficulty and pain initiating urination and cloudy urine, but no dysuria once the stream is started, and no hematuria.  Constipation, occasional small amounts of bright red blood on toilet paper, and some leakage of stool.  Normal colonoscopy from 2012 in EHR.  He was recently hospitalized 5/10-5/16/2017 for a CHF exacerbation c/b pleural effusion.  He was diuresed for volume overloaded and pleural effusion drained by thoracentesis at that time.  Now having occasional dyspnea and pleuritic pain, but no PND, orthopnea, cough, or exertional chest pain.  Not using home O2.  Meds:  Prescriptions Prior to Admission    Medication Sig Dispense Refill Last Dose  . albuterol (VENTOLIN HFA) 108 (90 BASE) MCG/ACT inhaler Inhale 2 puffs into the lungs every 6 (six) hours as needed for wheezing or shortness of breath. 3 Inhaler 3 Past Month at Unknown time  . apixaban (ELIQUIS) 5 MG TABS tablet Take 1 tablet (5 mg total) by mouth 2 (two) times daily. 60 tablet 3 12/15/2015 at 0900  . aspirin 81 MG chewable tablet Chew 1 tablet (81 mg total) by mouth daily. 30 tablet 12 12/14/2015 at Unknown time  . buPROPion (WELLBUTRIN) 75 MG tablet Take 1 tablet (75 mg total) by mouth 2 (two) times daily. 180 tablet 3 12/15/2015 at Unknown time  . cholecalciferol (VITAMIN D) 1000 units tablet Take 1 tablet (1,000 Units total) by mouth daily. 90 tablet 3 12/15/2015 at Unknown time  . diltiazem (CARDIZEM CD) 120 MG 24 hr capsule Take 1 capsule (120 mg total) by mouth daily. 30 capsule 2 12/15/2015 at Unknown time  . dronabinol (MARINOL) 2.5 MG capsule Take 1 capsule (2.5 mg total) by mouth 2 (two) times daily before lunch and supper. 60 capsule 0 12/15/2015 at Unknown time  . furosemide (LASIX) 40 MG tablet Take 1 tablet (40 mg total) by mouth daily. 30 tablet 0 12/15/2015 at Unknown time  . metoprolol succinate (TOPROL-XL) 100 MG 24 hr tablet Take 100 mg by mouth  daily. Take with or immediately following a meal.   12/15/2015 at 0900  . mirtazapine (REMERON) 15 MG tablet TAKE 1 TABLET BY MOUTH AT BEDTIME 30 tablet 5 12/14/2015 at Unknown time  . Multiple Vitamin (MULTIVITAMIN WITH MINERALS) TABS tablet Take 1 tablet by mouth daily. Reported on 10/11/2015   12/15/2015 at Unknown time  . omeprazole (PRILOSEC) 40 MG capsule Take 1 capsule (40 mg total) by mouth daily. 90 capsule 3 12/15/2015 at Unknown time  . oxyCODONE-acetaminophen (PERCOCET) 10-325 MG tablet Take 1 tablet by mouth every 4 (four) hours as needed for pain. Do not take more than 5 tablets in 24 hours. 150 tablet 0 12/15/2015 at Unknown time  . rosuvastatin (CRESTOR) 20 MG tablet Take 1  tablet (20 mg total) by mouth daily at 6 PM. 90 tablet 3 12/14/2015 at Unknown time  . senna-docusate ([employer] SENNA PLUS) 8.6-50 MG tablet Take 1 tablet by mouth daily as needed for moderate constipation. 360 tablet 3 Past Week at Unknown time  . tiotropium (SPIRIVA) 18 MCG inhalation capsule Place 1 capsule (18 mcg total) into inhaler and inhale daily. 90 capsule 3 12/14/2015 at Unknown time     Allergies: Allergies as of 12/15/2015 - Review Complete 12/15/2015  Allergen Reaction Noted  . Amiodarone Other (See Comments) 07/30/2015  . Penicillins Rash    Past Medical History:  Diagnosis Date  . Alcohol abuse   . Anxiety   . Avascular necrosis of bones of both hips (HCC)    Status post bilateral hip replacements  . Bipolar disorder ([place])   . CHF (congestive heart failure) ([place])   . Chronic systolic heart failure ([place])    a. NICM - EF 35% with normal cors 2003. b. Last echo 11/2012 - EF 25-30%.  . CKD (chronic kidney disease)    Renal U/S 12/04/2009 showed no pathological findings. Labs 12/04/2009 include normal ESR, C3, C4; neg ANA; SPEP showed nonspecific increase in the alpha-2 region with no M-spike; UPEP showed no monoclonal free light chains; urine IFE showed polyclonal increase in feree Kappa and/or free Lambda light chains. Baseline Cr reported 1.7-2.5.  [name] COPD (chronic obstructive pulmonary disease) ([place])   . Depression   . DVT (deep venous thrombosis) ([place])    He has hypercoagulability with multiple prior DVTs  . E coli bacteremia   . Erectile dysfunction   . Gastritis   . GERD (gastroesophageal reflux disease)   . HCAP (healthcare-associated pneumonia) 08/24/2015  . Hydrocele, unspecified   . Hyperlipemia   . Hypertension   . 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  Thyroid scan 08/28/2009 was normal with no focal areas of abnormal increased or decreased activity seen; the uptake of I 131 sodium iodide at 24 hours was 5.7%.  TSH and free  T4 normalized by 08/16/2009.  . Intestinal obstruction ([place])   . Lung nodule    Chest CT scan on 12/14/2008 showed a nodular opacity at the left lung base felt to most likely represent scarring.  Follow-up chest CT scan on 06/02/2010 showed parenchymal scarring in the left apex, left  lower lobe, and lingula; some of this scarring at the left base had a nodular appearance, unchanged. Chest 09/2012 - stable.  . Noncompliance   . On home oxygen therapy    "3L periodically" (08/24/2015)  . Osteoarthritis   . Pleural plaque    H/o asbestos exposure. Chest CT on 06/02/2010 showed stable extensive calcified pleural plaques involving the left hemithorax,  consistent with asbestos related pleural disease.  . Portal vein thrombosis   . Spondylosis   . STEMI (ST elevation myocardial infarction) ([place]) 10/11/2014  . 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 b. St. Jude gen change 2010.  [name] Thrombocytopenia ([place])   . 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  . Weight loss    Normal colonoscopy by Dr. Olevia Perches on 11/06/2010.    Family History: Mother with HTN, CAD.  Social History: No alcohol, former smoker (~40 pack year), no drugs.  Review of Systems: A complete ROS was negative except as per HPI.  Physical Exam: Blood pressure 93/75, pulse (!) 49, temperature 98.4 F (36.9 C), temperature source Oral, resp. rate 12, SpO2 99 %.  Physical Exam  Constitutional: He is oriented to person, place, and time.  Thin man, speaking quietly, in no distress  HENT:  Head: Normocephalic and atraumatic.  Mouth/Throat: Mucous membranes are dry.  Eyes: Conjunctivae are normal. Pupils are equal, round, and reactive to light. Left eye exhibits no discharge. No scleral icterus.  Neck: Normal range of motion. Neck supple. No JVD present.  Cardiovascular: Normal rate and intact  distal pulses.  An irregularly irregular rhythm present.  No murmur heard. Pulmonary/Chest: No respiratory distress. He has decreased breath sounds in the left lower field. He has no wheezes. He has rales in the right lower field and the left lower field.  Abdominal: Soft. He exhibits no distension. There is no tenderness.  Genitourinary: Rectal exam shows external hemorrhoid.  Genitourinary Comments: Scrotum swollen Stool around rectum and buttocks  Musculoskeletal: Normal range of motion. He exhibits no edema, tenderness or deformity.  Neurological: He is alert and oriented to person, place, and time. No cranial nerve deficit.  Moves all extremities well.  Skin: Skin is warm and dry. No rash noted. No erythema.  Psychiatric: His affect is blunt. His speech is delayed. He exhibits a depressed mood. He expresses no homicidal and no suicidal ideation.   EKG: AFib/Flutter, no acute ischemic changes, stable from prior  CXR: Chronic L lung changes  CBC Latest Ref Rng & Units 12/15/2015 09/28/2015 09/27/2015  WBC 4.0 - 10.5 K/uL 8.4 10.5 11.8(H)  Hemoglobin 13.0 - 17.0 g/dL 14.1 12.5(L) 12.7(L)  Hematocrit 39.0 - 52.0 % 44.8 39.5 40.5  Platelets 150 - 400 K/uL 148(L) 183 193   CMP Latest Ref Rng & Units 12/15/2015 10/11/2015 10/01/2015  Glucose 65 - 99 mg/dL 98 114(H) 86  BUN 6 - 20 mg/dL 55(H) 25(H) 19  Creatinine 0.61 - 1.24 mg/dL 2.36(H) 2.04(H) 1.37(H)  Sodium 135 - 145 mmol/L 138 142 141  Potassium 3.5 - 5.1 mmol/L 5.8(H) 4.9 5.0  Chloride 101 - 111 mmol/L 109 104 99(L)  CO2 22 - 32 mmol/L 24 30 33(H)  Calcium 8.9 - 10.3 mg/dL 9.4 9.7 8.9  Total Protein 6.5 - 8.1 g/dL 6.2(L) - -  Total Bilirubin 0.3 - 1.2 mg/dL 0.7 - -  Alkaline Phos 38 - 126 U/L 58 - -  AST 15 - 41 U/L 22 - -  ALT 17 - 63 U/L 14(L) - -   Cardiac Panel (last 3 results)  Recent Labs  12/15/15 1746  TROPONINI <0.03   BNP (last 3 results)  Recent Labs  08/24/15 0740 09/27/15 1502 12/15/15 1746  BNP 1,529.1*  1,590.1* 231.3*     Assessment & Plan by Problem: Active Problems:  Depression   Chronic kidney disease (CKD), stage III (moderate)   Longstanding persistent atrial fibrillation (HCC)   AKI (acute kidney injury) ([place])   Hyperkalemia  Travis Hearn is an elderly man with multiple chronic medical conditions who presents with failure to thrive with depression, medication noncompliance, and social isolation in the context of new social stressors in the last month.  His chronic medical conditions currently appear stable.   #Depression #Failure to Thrive -Continue home bupropion, mirtazapine -Uptitrate mirtazapine for depression and appetite -SW consult  #Hyperkalemia #AKI Received 1L NS in ED.  No EKG changes. -Gentle IVF 100 ml/hr NS -Repeat BMP in AM  #Dysuria UTI vs BPH. -UA, UCx  #Rectal Bleeding #Fecal Incontinence Scant red blood on toilet paper with rectal pain and fecal incontinence.  Fissure vs hemorrhoids.  Negative colonscopy 5 years ago. -Outpatient GI f/u  #CHF #HTN -Hold lasix -Continue home diltiazem, metoprolol  #COPD #AFib -Continue home meds  Dispo: Admit patient to Observation with expected length of stay less than 2 midnights.  Signed: Minus Liberty, MD 12/15/2015, 8:20 PM  Pager: [phone]

## 2015-12-15 NOTE — Patient Outreach (Signed)
[name]) Care Management   12/15/2015  [name] [birth year] [id number]  [name] is an 70 y.o. male  Subjective:  HF: Pt states he does not have any scales at this time indicating he needs to get them from his old residence. Pt called his friend and requested her to bring his scales. Pt states he has not weight over 2 weeks since moving to his new residence.  Pt reports since he moved into his new apartment he has been very weak and "forgetting thing". Pt denies any swelling over the last month. NUTRITIONAL: pt states poor appetite and he has been drinking Ensure several times daily but not really eating to well. reported by his girlfriend.  MEDICATION: Pt states he has taken all his medications today but states some days he forgets and has not be consistent with his daily medications.  MEDICAL APPOINTMENT: Pt reports he thinks he has attended all his medical appointments but cannot remember some of his appointments. States he has an scheduled appointment today but not sure or the provider. Pt states some confuses and he just "can't remember".  WOUND: Pt reports he has a wound to his but that is getting worse and now "bleed" when he "wipes himself after a bowel movement and painful upon urination. Pt states it is very painful and he wants sometime done "now" and can not wait for a doctor's appointment.  Several issues raised today and pt reports he needs assistance in the home and unable to care for himself due to his ongoing weakness(no falls) and memory issues. Pt has requested assistance but states he is going to the ED today if not improved. Pt has limited assistance but call his girlfriend to come over and assist but decided he just wants to go to the end due his weakness and wound to his sacral area.  Pt reports upon RN arrival that it is too cold in the apartment and called maintenance due to the air condition would not turn off. Pt unplugged the device and office  personnel visited while RN present and indicated she would arrange for maintenance to address.   Objective:   Review of Systems  Constitutional: Positive for weight loss.       Pt appears very fatigue and weak with weight loss since the last home visit by this RN case manager.  HENT: Positive for congestion.        Some congestion with loose cough but lung remain clear.  Eyes: Negative.   Respiratory: Positive for cough.        Productive cough with clear sputum and lungs remain clear upon ascultation.  Cardiovascular: Negative.   Gastrointestinal: Positive for constipation.       Reported by pt  Genitourinary: Negative.   Musculoskeletal: Negative.   Skin: Negative.        Pt reports decubitus ulcer to sacral area  Neurological: Positive for weakness.  Endo/Heme/Allergies: Negative.   Psychiatric/Behavioral:       Pt reports he has forgetting a lot of things like taking his medication on some days.  All other systems reviewed and are negative.   Physical Exam  Constitutional:  Pt very thin with weight lost since RN last home visit in June.   HENT:  Right Ear: External ear normal.  Left Ear: External ear normal.  Eyes: EOM are normal.  Neck: Normal range of motion.  Cardiovascular: Normal heart sounds.   Respiratory: Breath sounds normal.  Upper congestion however  lungs remain clear  GI: Soft. Bowel sounds are normal.  Musculoskeletal: Normal range of motion.  Neurological: He is alert.  Psychiatric: He has a normal mood and affect. His behavior is normal.  Very forget with his thought process.    Encounter Medications:   Outpatient Encounter Prescriptions as of 12/15/2015  Medication Sig  . albuterol (VENTOLIN HFA) 108 (90 BASE) MCG/ACT inhaler Inhale 2 puffs into the lungs every 6 (six) hours as needed for wheezing or shortness of breath.  [name] apixaban (ELIQUIS) 5 MG TABS tablet Take 1 tablet (5 mg total) by mouth 2 (two) times daily.  [name] aspirin 81 MG chewable tablet  Chew 1 tablet (81 mg total) by mouth daily.  [name] buPROPion (WELLBUTRIN) 75 MG tablet Take 1 tablet (75 mg total) by mouth 2 (two) times daily.  . cholecalciferol (VITAMIN D) 1000 units tablet Take 1 tablet (1,000 Units total) by mouth daily.  [name] diltiazem (CARDIZEM CD) 120 MG 24 hr capsule Take 1 capsule (120 mg total) by mouth daily.  [name] dronabinol (MARINOL) 2.5 MG capsule Take 1 capsule (2.5 mg total) by mouth 2 (two) times daily before lunch and supper.  . furosemide (LASIX) 40 MG tablet Take 1 tablet (40 mg total) by mouth daily.  . metoprolol succinate (TOPROL-XL) 100 MG 24 hr tablet Take 100 mg by mouth daily. Take with or immediately following a meal.  . mirtazapine (REMERON) 15 MG tablet TAKE 1 TABLET BY MOUTH AT BEDTIME  . Multiple Vitamin (MULTIVITAMIN WITH MINERALS) TABS tablet Take 1 tablet by mouth daily. Reported on 10/11/2015  . omeprazole (PRILOSEC) 40 MG capsule Take 1 capsule (40 mg total) by mouth daily.  [name] oxyCODONE-acetaminophen (PERCOCET) 10-325 MG tablet Take 1 tablet by mouth every 4 (four) hours as needed for pain. Do not take more than 5 tablets in 24 hours.  . rosuvastatin (CRESTOR) 20 MG tablet Take 1 tablet (20 mg total) by mouth daily at 6 PM.  . senna-docusate ([employer] SENNA PLUS) 8.6-50 MG tablet Take 1 tablet by mouth daily as needed for moderate constipation.  [name] tiotropium (SPIRIVA) 18 MCG inhalation capsule Place 1 capsule (18 mcg total) into inhaler and inhale daily.   No facility-administered encounter medications on file as of 12/15/2015.     Functional Status:   In your present state of health, do you have any difficulty performing the following activities: 11/15/2015 10/11/2015  Hearing? N N  Vision? N N  Difficulty concentrating or making decisions? N N  Walking or climbing stairs? N Y  Dressing or bathing? Y Y  Doing errands, shopping? Y Y  Preparing Food and eating ? - -  Using the Toilet? - -  In the past six months, have you accidently leaked urine? - -   Do you have problems with loss of bowel control? - -  Managing your Medications? - -  Managing your Finances? - -  Housekeeping or managing your Housekeeping? - -  Some recent data might be hidden    Fall/Depression Screening:    PHQ 2/9 Scores 11/15/2015 10/11/2015 10/09/2015 09/10/2015 07/28/2015 07/19/2015 06/27/2015  PHQ - 2 Score 0 0 0 0 4 1 0  PHQ- 9 Score - - - - 15 - -  Exception Documentation - - - - - - -  BP 118/62 (BP Location: Right Arm, Patient Position: Sitting, Cuff Size: Normal)   Pulse (!) 113   Resp 20   Ht 1.905 m (6\' 3" )   SpO2 98%   Assessment:  Case management related to HF Nutritional issues related to lack of appetite and weight loss Adherence related to medications Adherence related to medical appointments Wound issues related to sacral ulcer Respiratory related to saturations(cold fingers in temperature setting of his room) Potential safety related to weakness  Plan: Will completed a physical assessment and report today's findings to Dr. Roxanne Mins (primary provider). Will inquired on pt's ongoing monitoring related his HF with daily however pt confirm no weights but will obtain his scales soon. RN offered to assist and requested scales from social worker consult along with other request on a needed home visit via [facility] social work. Also encouraged pt to obtain his scales from his old residence not to delay daily weights due to his fragile state. Further discussion on HF as pt indicated his thoughts were not clear and he can't remember. Will review this information at another time due to other issues pt  Is currently undergoing that may require medical attention. Inquired on nutritional status as pt appears to have lost weight since RN last encounter with this pt. Verified pt consumes supplements (Ensure) as reports several times throughout the day.  Strongly encouraged on the importance of food consumption and how this builds strength for his daily ADLs.  Will consult with  social for ongoing mobile meals if available at the new residence for this pt.  Note [facility] indicated pt was suppose to contact her once the paid mobile meals ending early July to resume meals under the regulation of Mobile Meals services for community services however pt did not call and meals were discontinued (referral sent to [place] via [facility] social worker to follow). Will verify pt has taking all her medications for today however pt has indicates several days missed in the pass due to memory issues (will alert provider). Will also stress to the pt the importance of taking his daily medications to avoid acute encountered that may result in hospitalization. Also discuss level of care issues in detail concerning other options for assistance in a facility (pt declined). Note pt has [facility] who initial assisted pt with organizing his medication several months ago and pt was doing very well however over the last month this process her deceased alone with pt's overall health (will update pt's provider on the assessment for today concerning this matter). Will verify pt has attended all medical appointments. This information provided today was verified by the pt. Will continue to encouraged adherence. Will inquired further on pt's sacral ulcer ( pt did not allow RN to assess at this time) however pt wishes to have this treated today (hospital) and indicates "it bleeds when wiped" (provider will be made aware).  Will recheck pt's saturation that RN was initially unable to obtain due to pt's extremities were very cold however over time able to obtain a saturation that was within normal range and stable range during today's assessment.  Will strongly encouraged pt to consider once again a different level of care due to his weak status and assistance needed in the home.  Will also refer via [name] worker to assist pt with completing another M[employer] application ( the first was lost) for possible benefits services for  [facility]  and other available program that may benefit this pt.  Reviewed the plan of care and goals readjusted at needed. Contacted primary care provider concerning the above and as requested contacted 911 non-emergency for escort to the hospital to address both pt's weakness and sacral ulcer for intervention. Will follow  up accordingly with pt discharge for ongoing [facility] services.  [name], RN Care Management Coordinator [place] Office [phone]

## 2015-12-15 NOTE — ED Triage Notes (Signed)
Per EMS, pt from home with c/o weakness and shortness of breath x 3 weeks, dizziness upon standing. Hx of afib, noncompliant with medications. BP-120 sitting, 80 standing, HR-120s, SpO2-98% ra, 3L O2 PRN at home. Pt has a Corporate investment banker. CBG-155, given 281mL PTA.

## 2015-12-15 NOTE — Telephone Encounter (Signed)
[name] from [facility] called concerned about patient's well-being. Pt. Recently moved and [name] reports dramatic weight loss, decrease in mental functioning and patient reports a decubitus ulcer on sacrum that had not yet been visualized by RN. Patient reportedly is not eating and refusing to discus higher level of care with [name] RN reports that the home is very cold and pts. fingers were blue. Pt reports cough, but lungs clear per RN. Per RN appears to be failure to thrive. Patient states he is going to go to ED today. Added to [facility] schedule for Monday AM for follow-up or assessment if he does not go to ED.

## 2015-12-16 ENCOUNTER — Encounter (HOSPITAL_COMMUNITY): Payer: Self-pay | Admitting: Internal Medicine

## 2015-12-16 DIAGNOSIS — F339 Major depressive disorder, recurrent, unspecified: Secondary | ICD-10-CM

## 2015-12-16 DIAGNOSIS — I8290 Acute embolism and thrombosis of unspecified vein: Secondary | ICD-10-CM | POA: Diagnosis not present

## 2015-12-16 DIAGNOSIS — R630 Anorexia: Secondary | ICD-10-CM | POA: Diagnosis not present

## 2015-12-16 DIAGNOSIS — I5022 Chronic systolic (congestive) heart failure: Secondary | ICD-10-CM

## 2015-12-16 DIAGNOSIS — I482 Chronic atrial fibrillation: Secondary | ICD-10-CM

## 2015-12-16 DIAGNOSIS — B9689 Other specified bacterial agents as the cause of diseases classified elsewhere: Secondary | ICD-10-CM

## 2015-12-16 DIAGNOSIS — E785 Hyperlipidemia, unspecified: Secondary | ICD-10-CM

## 2015-12-16 DIAGNOSIS — N390 Urinary tract infection, site not specified: Secondary | ICD-10-CM

## 2015-12-16 DIAGNOSIS — N179 Acute kidney failure, unspecified: Secondary | ICD-10-CM | POA: Diagnosis not present

## 2015-12-16 DIAGNOSIS — J449 Chronic obstructive pulmonary disease, unspecified: Secondary | ICD-10-CM

## 2015-12-16 DIAGNOSIS — E559 Vitamin D deficiency, unspecified: Secondary | ICD-10-CM

## 2015-12-16 DIAGNOSIS — Z79899 Other long term (current) drug therapy: Secondary | ICD-10-CM

## 2015-12-16 DIAGNOSIS — K625 Hemorrhage of anus and rectum: Secondary | ICD-10-CM | POA: Diagnosis not present

## 2015-12-16 DIAGNOSIS — N3090 Cystitis, unspecified without hematuria: Secondary | ICD-10-CM

## 2015-12-16 DIAGNOSIS — N183 Chronic kidney disease, stage 3 (moderate): Secondary | ICD-10-CM | POA: Diagnosis not present

## 2015-12-16 DIAGNOSIS — Z9114 Patient's other noncompliance with medication regimen: Secondary | ICD-10-CM

## 2015-12-16 DIAGNOSIS — R531 Weakness: Secondary | ICD-10-CM | POA: Diagnosis not present

## 2015-12-16 DIAGNOSIS — I130 Hypertensive heart and chronic kidney disease with heart failure and stage 1 through stage 4 chronic kidney disease, or unspecified chronic kidney disease: Secondary | ICD-10-CM | POA: Diagnosis not present

## 2015-12-16 DIAGNOSIS — K219 Gastro-esophageal reflux disease without esophagitis: Secondary | ICD-10-CM

## 2015-12-16 DIAGNOSIS — J90 Pleural effusion, not elsewhere classified: Secondary | ICD-10-CM | POA: Diagnosis not present

## 2015-12-16 DIAGNOSIS — Z7901 Long term (current) use of anticoagulants: Secondary | ICD-10-CM

## 2015-12-16 DIAGNOSIS — R159 Full incontinence of feces: Secondary | ICD-10-CM

## 2015-12-16 DIAGNOSIS — I5042 Chronic combined systolic (congestive) and diastolic (congestive) heart failure: Secondary | ICD-10-CM | POA: Diagnosis not present

## 2015-12-16 LAB — URINE MICROSCOPIC-ADD ON

## 2015-12-16 LAB — URINALYSIS, ROUTINE W REFLEX MICROSCOPIC
BILIRUBIN URINE: NEGATIVE
GLUCOSE, UA: NEGATIVE mg/dL
KETONES UR: NEGATIVE mg/dL
NITRITE: POSITIVE — AB
PH: 5.5 (ref 5.0–8.0)
PROTEIN: NEGATIVE mg/dL
Specific Gravity, Urine: 1.019 (ref 1.005–1.030)

## 2015-12-16 LAB — BASIC METABOLIC PANEL
ANION GAP: 7 (ref 5–15)
BUN: 50 mg/dL — ABNORMAL HIGH (ref 6–20)
CALCIUM: 9.1 mg/dL (ref 8.9–10.3)
CO2: 19 mmol/L — AB (ref 22–32)
CREATININE: 2.14 mg/dL — AB (ref 0.61–1.24)
Chloride: 112 mmol/L — ABNORMAL HIGH (ref 101–111)
GFR, EST AFRICAN AMERICAN: 34 mL/min — AB (ref >60)
GFR, EST NON AFRICAN AMERICAN: 29 mL/min — AB (ref >60)
Glucose, Bld: 249 mg/dL — ABNORMAL HIGH (ref 65–99)
Potassium: 5.1 mmol/L (ref 3.5–5.1)
Sodium: 138 mmol/L (ref 135–145)

## 2015-12-16 LAB — MRSA PCR SCREENING: MRSA by PCR: NEGATIVE

## 2015-12-16 MED ORDER — MIRTAZAPINE 30 MG PO TABS: 30.0000 mg | ORAL_TABLET | Freq: Every day | ORAL | 0 refills | Status: DC

## 2015-12-16 MED ORDER — MIRTAZAPINE 15 MG PO TABS
30.0000 mg | ORAL_TABLET | Freq: Every day | ORAL | Status: DC
  Administered 2015-12-16 – 2015-12-17 (×2): 30 mg via ORAL
  Filled 2015-12-16 (×2): qty 2

## 2015-12-16 MED ORDER — PNEUMOCOCCAL VAC POLYVALENT 25 MCG/0.5ML IJ INJ
0.5000 mL | INJECTION | INTRAMUSCULAR | Status: AC
  Administered 2015-12-17: 0.5 mL via INTRAMUSCULAR
  Filled 2015-12-16: qty 0.5

## 2015-12-16 MED ORDER — BOOST / RESOURCE BREEZE PO LIQD
1.0000 | Freq: Three times a day (TID) | ORAL | Status: DC
  Administered 2015-12-16 – 2015-12-18 (×6): 1 via ORAL

## 2015-12-16 MED ORDER — DILTIAZEM HCL ER COATED BEADS 120 MG PO CP24
120.0000 mg | ORAL_CAPSULE | Freq: Every day | ORAL | Status: DC
  Administered 2015-12-16 – 2015-12-18 (×3): 120 mg via ORAL
  Filled 2015-12-16 (×3): qty 1

## 2015-12-16 MED ORDER — CIPROFLOXACIN HCL 250 MG PO TABS
250.0000 mg | ORAL_TABLET | Freq: Two times a day (BID) | ORAL | Status: DC
Start: 2015-12-16 — End: 2015-12-18
  Administered 2015-12-16 – 2015-12-18 (×5): 250 mg via ORAL
  Filled 2015-12-16 (×5): qty 1

## 2015-12-16 MED ORDER — CIPROFLOXACIN HCL 250 MG PO TABS: 250.0000 mg | ORAL_TABLET | Freq: Two times a day (BID) | ORAL | 0 refills | Status: DC

## 2015-12-16 MED ORDER — NEPRO/CARBSTEADY PO LIQD
237.0000 mL | ORAL | Status: DC
Start: 2015-12-16 — End: 2015-12-18
  Administered 2015-12-16: 237 mL via ORAL

## 2015-12-16 NOTE — Progress Notes (Signed)
  PT Cancellation Note  Patient Details Name: [name] MRN: [id number] DOB: June 15, 1945   Cancelled Treatment:    Reason Eval/Treat Not Completed: Fatigue/lethargy limiting ability to participate;Patient declined, no reason specified.  Patient declined PT, stating he didn't feel like working with PT.  States he feels like he is not ready to go home mentally or physically.  RN in room to hear conversation.  Encouraged patient to work with PT, continued to decline.  Will return later today to attempt PT evaluation.   [name] 12/16/2015, 4:17 PM [name]. [name], [place] Pager [phone]

## 2015-12-16 NOTE — Evaluation (Signed)
Physical Therapy Evaluation Patient Details Name: [name] MRN: [id number] DOB: [birth year] Today's Date: 12/16/2015   History of Present Illness  Patient is a 70 yo mal admitted 12/15/15 with major depression, weight loss, hyperkalemia.  PMH:  depression, Afib, CHF, CKD, COPD, PVD  Clinical Impression  Patient presents with problems listed below.  Will benefit from acute PT to maximize functional mobility prior to discharge. Recommend SNF at d/c for continued therapy.  If patient unable to go to SNF, recommend HHPT and [place].    Follow Up Recommendations SNF;Supervision for mobility/OOB;Home health PT    Equipment Recommendations  None recommended by PT    Recommendations for Other Services       Precautions / Restrictions Precautions Precautions: Fall Restrictions Weight Bearing Restrictions: No      Mobility  Bed Mobility Overal bed mobility: Modified Independent             General bed mobility comments: Increased time  Transfers Overall transfer level: Needs assistance Equipment used: Rolling walker (2 wheeled) Transfers: Sit to/from Stand Sit to Stand: Min assist;From elevated surface         General transfer comment: Verbal cues for hand placement.  Assist to rise to standing and for balance once upright.  Patient fatigues quickly in stance.  Patient stood x2 from bed.  Ambulation/Gait             General Gait Details: Declined due to pain in feet.  Stairs            Wheelchair Mobility    Modified Rankin (Stroke Patients Only)       Balance Overall balance assessment: Needs assistance         Standing balance support: Bilateral upper extremity supported;During functional activity Standing balance-Leahy Scale: Poor Standing balance comment: UE support to maintain balance.                             Pertinent Vitals/Pain Pain Assessment: Faces Faces Pain Scale: Hurts even more Pain Location: Feet and lower  legs Pain Descriptors / Indicators: Aching;Sore Pain Intervention(s): Limited activity within patient's tolerance;Monitored during session;Repositioned    Home Living Family/patient expects to be discharged to:: Assisted living Living Arrangements: Alone Available Help at Discharge: Other (Comment) (Patient reports none)           Home Equipment: Walker - 2 wheels;Cane - single point;Bedside commode;Shower seat;Wheelchair - manual Additional Comments: Recently move to apartment alone.      Prior Function Level of Independence: Needs assistance   Gait / Transfers Assistance Needed: Uses RW or cane for gait           Hand Dominance   Dominant Hand: Right    Extremity/Trunk Assessment   Upper Extremity Assessment: Generalized weakness           Lower Extremity Assessment: Generalized weakness;RLE deficits/detail;LLE deficits/detail RLE Deficits / Details: Painful callous on heel    Cervical / Trunk Assessment: Normal  Communication   Communication: No difficulties  Cognition Arousal/Alertness: Awake/alert Behavior During Therapy: Flat affect;Agitated Overall Cognitive Status: Within Functional Limits for tasks assessed (Oriented)                      General Comments      Exercises        Assessment/Plan    PT Assessment Patient needs continued PT services  PT Diagnosis Difficulty walking;Generalized weakness;Acute pain  PT Problem List Decreased strength;Decreased activity tolerance;Decreased balance;Decreased mobility;Impaired sensation;Pain  PT Treatment Interventions DME instruction;Gait training;Functional mobility training;Therapeutic activities;Therapeutic exercise;Balance training;Patient/family education   PT Goals (Current goals can be found in the Care Plan section) Acute Rehab PT Goals Patient Stated Goal: To get stronger PT Goal Formulation: With patient Time For Goal Achievement: 12/23/15 Potential to Achieve Goals: Good     Frequency Min 3X/week   Barriers to discharge Decreased caregiver support Patient lives alone.  Reports no support available.    Co-evaluation               End of Session Equipment Utilized During Treatment: Gait belt Activity Tolerance: Patient limited by fatigue;Patient limited by pain Patient left: in bed;with call bell/phone within reach;with bed alarm set (sitting EOB) Nurse Communication: Mobility status (Requesting bath and linen change)    Functional Assessment Tool Used: Clinical judgement Functional Limitation: Mobility: Walking and moving around Mobility: Walking and Moving Around Current Status [id number]): At least 20 percent but less than 40 percent impaired, limited or restricted Mobility: Walking and Moving Around Goal Status [id number]): At least 1 percent but less than 20 percent impaired, limited or restricted    Time: 1741-1755 PT Time Calculation (min) (ACUTE ONLY): 14 min   Charges:   PT Evaluation $PT Eval Moderate Complexity: 1 Procedure     PT G Codes:   PT G-Codes **NOT FOR INPATIENT CLASS** Functional Assessment Tool Used: Clinical judgement Functional Limitation: Mobility: Walking and moving around Mobility: Walking and Moving Around Current Status [id number]): At least 20 percent but less than 40 percent impaired, limited or restricted Mobility: Walking and Moving Around Goal Status [phone]): At least 1 percent but less than 20 percent impaired, limited or restricted    [name] 12/16/2015, 6:17 PM [name]. [name], [place] Pager [phone]

## 2015-12-16 NOTE — Clinical Social Work Note (Signed)
Per MD request. CSW met with patient to discuss outpatient resources for therapy and medication management. CSW provided patient with list of outpatient resources for therapy and medication management. Patient reported he will review resources. CSW also gave patient information on the [facility] and discussed getting out of his home to interact with his peers at the [facility]. Patient reported he is willing to go to the [facility] after discharge. CSW signing off.  Shonny , LCSW [phone] 

## 2015-12-16 NOTE — Progress Notes (Signed)
Internal Medicine Attending  Date: 12/16/2015  Patient name: [name] Medical record number: [id number] Date of birth: 09-25-1945 Age: 70 y.o. Gender: male  I saw and evaluated the patient. I reviewed the resident's note by Dr. Randell Patient and I agree with the resident's findings and plans as documented in her progress note.  Please see my H&P dated 12/16/2015 and attached to Dr. Rowe Pavy H&P dated 12/15/2015 for the specifics of my evaluation, assessment, and plan from earlier today.

## 2015-12-16 NOTE — Plan of Care (Signed)
Problem: Health Behavior/Discharge Planning: Goal: Ability to manage health-related needs will improve Outcome: Progressing Pt verbalizes understanding of needing to weigh daily and follow up regularly with heart failure clinic. Pt reports having received heart failure book. Discussed reviewing videos. Will continue to reinforce need for follow up.

## 2015-12-16 NOTE — Telephone Encounter (Signed)
Thank you, agree with plan for him to come to the ED. I saw him in the hospital for admission. He will need close follow up in clinic after discharge.

## 2015-12-16 NOTE — Progress Notes (Signed)
Patient had 6 beat run of Vtach. Patient's VSS,denies any chest pain or discomfort.MD on call notified. No new orders. Will continue to monitor. Joycie Aerts, Wonda Cheng, Therapist, sports

## 2015-12-16 NOTE — Progress Notes (Signed)
Patient heart rate ranging 100-115 atrial fibrillation with minimal movement heart rate 130-140's  non sustaining .Patient asymptomatic.Spoke with MD Minus Liberty and order received to give morning dose of cardizem  now and given.

## 2015-12-16 NOTE — Progress Notes (Signed)
CM received call from Md to please arrange for home health for pt.  CM met with pt in room who adamantly refuses all [place] services.  CM has reported off to charge RN and states IF pt changes mind, please call Cm [phone] to arrange Home health services.

## 2015-12-16 NOTE — Progress Notes (Signed)
Initial Nutrition Assessment  DOCUMENTATION CODES:   Severe malnutrition in context of social or environmental circumstances  INTERVENTION:   Nepro Shake po daily, each supplement provides 425 kcal and 19 grams protein  Boost Breeze po TID, each supplement provides 250 kcal and 9 grams of protein  NUTRITION DIAGNOSIS:   Malnutrition related to social / environmental circumstances as evidenced by 12 percent weight loss x 3 months, severe depletion of body fat, severe depletion of muscle mass, energy intake < or equal to 50% for > or equal to 1 month.  GOAL:   Patient will meet greater than or equal to 90% of their needs  MONITOR:   PO intake, Supplement acceptance, I & O's  REASON FOR ASSESSMENT:   Consult Assessment of nutrition requirement/status  ASSESSMENT:   Pt with history of major depression, atrial fibrillation that has been difficult to manage, chronic systolic and diastolic heart failure, stage III chronic kidney disease, chronic obstructive pulmonary disease, and peripheral vascular occlusive disease who presents with a one-month history of decreased appetite, anhedonia, and weight loss. Apparently one month ago he moved out of the home that he shared with his long standing friend and significant other. Since then he's developed a depressed mood with poor sleep, lack of energy, and no initiation. He has not left the house and has had very little social interaction. He also admits that he has been noncompliant with many of his medications.   Medications reviewed and include: vitamin D, remeron Labs reviewed: glucose 249, BNP 231.3 Nutrition-Focused physical exam completed. Findings are severe fat depletion, severe muscle depletion, and no edema.   Pt reports no appetite. He has not been out of his apartment since moving there at the beginning of July. Family brings him frozen dinners which he sometimes eats and sometimes does not. He does drink ensure at most 2 per day.  Discussed importance of adequate nutrition and drinking supplements if not eating.   Meal Completion: 0%   Diet Order:  Diet regular Room service appropriate? Yes; Fluid consistency: Thin  Skin:  Reviewed, no issues  Last BM:  7/27  Height:   Ht Readings from Last 1 Encounters:  12/15/15 6\' 3"  (1.905 m)    Weight:   Wt Readings from Last 1 Encounters:  12/15/15 146 lb 9.7 oz (66.5 kg)    Ideal Body Weight:  89 kg  BMI:  Body mass index is 18.32 kg/m.  Estimated Nutritional Needs:   Kcal:  2000-2200  Protein:  100-115 grams  Fluid:  > 2 L/day  EDUCATION NEEDS:   Education needs addressed  Maylon Peppers RD, [place], [place] Pager [phone] After Hours Pager

## 2015-12-16 NOTE — Progress Notes (Addendum)
   Subjective: Doing well this morning. No new complaints. Wants his diet changed to regular diet.  Objective:  Vital signs in last 24 hours: Vitals:   12/15/15 2349 12/16/15 0422 12/16/15 0424 12/16/15 0944  BP:  90/72 (!) 109/57 113/70  Pulse:  (!) 125  60  Resp:  18  18  Temp:  97.8 F (36.6 C)  97.9 F (36.6 C)  TempSrc:  Oral  Oral  SpO2: 95% 95%  100%  Weight:      Height:       General Apperance: NAD HEENT: Normocephalic, atraumatic, anicteric sclera Neck: Supple, trachea midline Lungs: Clear to auscultation bilaterally. No wheezes, rhonchi or rales. Breathing comfortably Heart: Irregular rhythm Abdomen: Soft, nontender, nondistended, no rebound/guarding Extremities: Warm and well perfused, no edema Skin: No rashes or lesions Neurologic: Alert and interactive. No gross deficits.   Assessment/Plan: 70 year old man with atrial fibrillation, chronic combined CHF, CKD3, COPD presenting with decreased appetite, depression.  Major Depression:  -Increase mirtazapine from 15mg  QHS to 30mg  QHS. -Continue bupropion 75mg  BID. May consider careful uptitration (no guidelines for renal dysfunction). -Social work and Transport planner consults for social services and home health needs. -PCP will consider PACE program -PT eval  AKI on CKD3, hyperkalemia: Received IV fluids overnight. Hypokalemia resolved and creatinine down to 2.14 from 2.36. Baseline is around 2.  UTI: Continue cipro 250mg  BID to complete 7 days of antibiotics.  Rectal bleeding, fecal incontinence: Follow up at outpatient visit.  Chronic Systolic CHF: Restart Lasix at discharge. Continue home metoprolol. No ACE given renal dysfunction  Chronic Atrial Fibrillation: CHADSVASC of at least 3. Continue home diltiazem and metoprolol. Continue Eliquis.  Vitamin D Deficiency: Continue home vitamin D supplementation  GERD: Continue home PPI as Protonix 40mg  daily  HLD: Continue home Crestor.  COPD: Continue home  tiotropium  Dispo: Likely home today.  LOS: 1 day   Milagros Loll, MD 12/16/2015, 10:16 AM Pager: [phone]

## 2015-12-16 NOTE — Discharge Summary (Signed)
Name: [name] MRN: [id number] DOB: Jun 06, 1945 70 y.o. PCP: Zada Finders, MD  Date of Admission: 12/15/2015  4:09 PM Date of Discharge: 12/18/2015 Attending Physician: Aldine Contes, MD  Discharge Diagnosis: 1. Major depression, recurrent, chronic ([place]) 2. Acute Kidney Injury on Chronic kidney disease (CKD), stage III (moderate)   Discharge Medications:   Medication List    TAKE these medications   albuterol 108 (90 Base) MCG/ACT inhaler Commonly known as:  VENTOLIN HFA Inhale 2 puffs into the lungs every 6 (six) hours as needed for wheezing or shortness of breath.   apixaban 5 MG Tabs tablet Commonly known as:  ELIQUIS Take 1 tablet (5 mg total) by mouth 2 (two) times daily.   aspirin 81 MG chewable tablet Chew 1 tablet (81 mg total) by mouth daily.   buPROPion 75 MG tablet Commonly known as:  WELLBUTRIN Take 1 tablet (75 mg total) by mouth 2 (two) times daily.   cholecalciferol 1000 units tablet Commonly known as:  VITAMIN D Take 1 tablet (1,000 Units total) by mouth daily.   ciprofloxacin 250 MG tablet Commonly known as:  CIPRO Take 1 tablet (250 mg total) by mouth 2 (two) times daily.   diltiazem 120 MG 24 hr capsule Commonly known as:  CARDIZEM CD Take 1 capsule (120 mg total) by mouth daily.   dronabinol 2.5 MG capsule Commonly known as:  MARINOL Take 1 capsule (2.5 mg total) by mouth 2 (two) times daily before lunch and supper.   furosemide 40 MG tablet Commonly known as:  LASIX Take 1 tablet (40 mg total) by mouth daily.   metoprolol succinate 100 MG 24 hr tablet Commonly known as:  TOPROL-XL Take 100 mg by mouth daily. Take with or immediately following a meal.   mirtazapine 30 MG tablet Commonly known as:  REMERON Take 1 tablet (30 mg total) by mouth at bedtime. What changed:  See the new instructions.   multivitamin with minerals Tabs tablet Take 1 tablet by mouth daily. Reported on 10/11/2015   omeprazole 40 MG capsule Commonly  known as:  PRILOSEC Take 1 capsule (40 mg total) by mouth daily.   oxyCODONE-acetaminophen 10-325 MG tablet Commonly known as:  PERCOCET Take 1 tablet by mouth every 4 (four) hours as needed for pain. Do not take more than 5 tablets in 24 hours.   rosuvastatin 20 MG tablet Commonly known as:  CRESTOR Take 1 tablet (20 mg total) by mouth daily at 6 PM.   senna-docusate 8.6-50 MG tablet Commonly known as:  [employer] SENNA PLUS Take 1 tablet by mouth daily as needed for moderate constipation.   tiotropium 18 MCG inhalation capsule Commonly known as:  SPIRIVA Place 1 capsule (18 mcg total) into inhaler and inhale daily.       Disposition and follow-up:   [name] was discharged from [facility] in Stable condition.  At the hospital follow up visit please address:  1.  Home mirtazapine was increased to 53m QHS. Discharged on Cipro 2541mBID. Please follow up with patient on completion of antibiotic course. Please consider PACE program for this patient. This may allow for more social interaction and improvement in his mood. This was discussed during his stay and patient was agreeable. [facility]   2.  Labs / imaging needed at time of follow-up: None   3.  Pending labs/ test needing follow-up: None  Follow-up Appointments: Follow-up Information    [place] 12[place].   Why:  8:15AM for hospital follow up Contact information: [address] [place] [facility] Course by problem list:   1. Major Depression: He presented with one month history of decreased appetite, anhedonia, weight loss. He recently moved into his own apartment and is facing social isolation. We increased his mirtazapine from 27m QHS to 328mQHS. We continued his bupropion 7538mID. May consider careful uptitration (no guidelines for renal dysfunction) at future follow up visits. We discussed  with him the potential of having him join the PACE program, which he is agreeable. Social work and carTransport plannerre consulted for for social services and home health needs. Discharged with HH [facility]/RN/OT.  2. AKI on CKD3, hyperkalemia: His potassium on admission was 5.8 and his creatinine was 2.36. He received IV fluids overnight. Hypokalemia resolved and creatinine down to 2.14 from 2.36. Baseline creatinine is around 2.  3.  Complicated UTI: In the setting of CKD. Urinalysis had many bacteria, large hgb, moderate leukocytes, positive nitrite, numerous WBC. Urine Cx with >100,000 E Coli. Resistant to bactrim. Continue cipro 250m23mD day 3 of 7.  Rectal bleeding, fecal incontinence: Follow up at outpatient visit.  Chronic Systolic CHF: Restart Lasix at discharge. Continue home metoprolol. No ACE given renal dysfunction  Discharge Vitals:   BP (!) 101/52 (BP Location: Left Arm)   Pulse 69   Temp 99 F (37.2 C) (Oral)   Resp 18   Ht _0  (1.905 m)   Wt 148 lb 4.8 oz (67.3 kg)   SpO2 99%   BMI 18.54 kg/m   Pertinent Labs, Studies, and Procedures:   Labs:   Ref. Range 12/15/2015 17:46 12/16/2015 05:31  Sodium Latest Ref Range: 135 - 145 mmol/L 138 138  Potassium Latest Ref Range: 3.5 - 5.1 mmol/L 5.8 (H) 5.1  Chloride Latest Ref Range: 101 - 111 mmol/L 109 112 (H)  CO2 Latest Ref Range: 22 - 32 mmol/L 24 19 (L)  BUN Latest Ref Range: 6 - 20 mg/dL 55 (H) 50 (H)  Creatinine Latest Ref Range: 0.61 - 1.24 mg/dL 2.36 (H) 2.14 (H)  Calcium Latest Ref Range: 8.9 - 10.3 mg/dL 9.4 9.1  EGFR (Non-African Amer.) Latest Ref Range: >60 mL/min 26 (L) 29 (L)  EGFR (African American) Latest Ref Range: >60 mL/min 30 (L) 34 (L)  Glucose Latest Ref Range: 65 - 99 mg/dL 98 249 (H)  Anion gap Latest Ref Range: 5 - _1 Ref. Range 12/15/2015 17:46  Alkaline Phosphatase Latest Ref Range: 38 - 126 U/L 58  Albumin Latest Ref Range: 3.5 - 5.0 g/dL 3.4 (L)  AST Latest Ref Range: 15 - 41 U/L 22    ALT Latest Ref Range: 17 - 63 U/L 14 (L)  Total Protein Latest Ref Range: 6.5 - 8.1 g/dL 6.2 (L)  Total Bilirubin Latest Ref Range: 0.3 - 1.2 mg/dL 0.7    Ref. Range 12/15/2015 17:46  WBC Latest Ref Range: 4.0 - 10.5 K/uL 8.4  RBC Latest Ref Range: 4.22 - 5.81 MIL/uL 4.83  Hemoglobin Latest Ref Range: 13.0 - 17.0 g/dL 14.1  HCT Latest Ref Range: 39.0 - 52.0 % 44.8  MCV Latest Ref Range: 78.0 - 100.0 fL 92.8  MCH Latest Ref Range: 26.0 - 34.0 pg 29.2  MCHC Latest Ref Range: 30.0 - 36.0 g/dL 31.5  RDW Latest Ref Range: 11.5 - 15.5 % 15.2  Platelets Latest Ref Range: 150 - 400 K/uL 148 (L)    Ref. Range 12/15/2015 17:46  B Natriuretic Peptide Latest Ref Range: 0.0 - 100.0 pg/mL 231.3 (H)  Troponin I Latest Ref Range: <0.03 ng/mL <0.03   Urinalysis:  Ref. Range 12/16/2015 00:53  Appearance Latest Ref Range: CLEAR  CLOUDY (A)  Bacteria, UA Latest Ref Range: NONE SEEN  MANY (A)  Bilirubin Urine Latest Ref Range: NEGATIVE  NEGATIVE  Color, Urine Latest Ref Range: YELLOW  YELLOW  Glucose Latest Ref Range: NEGATIVE mg/dL NEGATIVE  Hgb urine dipstick Latest Ref Range: NEGATIVE  LARGE (A)  Ketones, ur Latest Ref Range: NEGATIVE mg/dL NEGATIVE  Leukocytes, UA Latest Ref Range: NEGATIVE  MODERATE (A)  Nitrite Latest Ref Range: NEGATIVE  POSITIVE (A)  pH Latest Ref Range: 5.0 - 8.0  5.5  Protein Latest Ref Range: NEGATIVE mg/dL NEGATIVE  RBC / HPF Latest Ref Range: 0 - 5 RBC/hpf 0-5  Specific Gravity, Urine Latest Ref Range: 1.005 - 1.030  1.019  Squamous Epithelial / LPF Latest Ref Range: NONE SEEN  0-5 (A)  WBC, UA Latest Ref Range: 0 - 5 WBC/hpf TOO NUMEROUS TO C...   12/16/15 Urine Culture - pending  Imaging:  12/15/15 CXR 2 View FINDINGS: Moderate left pleural effusion versus pleural thickening on the left. Chronic changes in the left lung I was scarring. No confluent opacity on the right. Heart is upper limits normal in size. Right pacer is unchanged. IMPRESSION: Chronic  changes on the left. Moderate left pleural effusion versus pleural thickening.  Discharge Instructions: Discharge Instructions    Call MD for:  difficulty breathing, headache or visual disturbances    Complete by:  As directed   Call MD for:  persistant dizziness or light-headedness    Complete by:  As directed   Call MD for:  persistant nausea and vomiting    Complete by:  As directed   Call MD for:  severe uncontrolled pain    Complete by:  As directed   Call MD for:  temperature >100.4    Complete by:  As directed   Discharge instructions    Complete by:  As directed   Take your antibiotic, ciprofloxacin, twice a day until you complete it for your urinary tract infection. Your next dose is due tonight at 8:00pm. We increased the dose of your mirtazapine for your appetite and depression to 48m at bedtime. There is a new prescription for this at your pharmacy. Follow up in our clinic in the next 1-2 weeks. If you have any questions or concerns, call our clinic at [phone] after hours call [phone] and ask for the internal medicine resident on call.   Increase activity slowly    Complete by:  As directed      Signed: CVelna Ochs MD 12/18/2015, 3:33 PM   Pager: [phone]

## 2015-12-17 DIAGNOSIS — Z9981 Dependence on supplemental oxygen: Secondary | ICD-10-CM | POA: Diagnosis not present

## 2015-12-17 DIAGNOSIS — R531 Weakness: Secondary | ICD-10-CM | POA: Diagnosis not present

## 2015-12-17 DIAGNOSIS — R159 Full incontinence of feces: Secondary | ICD-10-CM | POA: Diagnosis not present

## 2015-12-17 DIAGNOSIS — N390 Urinary tract infection, site not specified: Secondary | ICD-10-CM | POA: Diagnosis not present

## 2015-12-17 DIAGNOSIS — E875 Hyperkalemia: Secondary | ICD-10-CM | POA: Diagnosis not present

## 2015-12-17 DIAGNOSIS — J920 Pleural plaque with presence of asbestos: Secondary | ICD-10-CM | POA: Diagnosis not present

## 2015-12-17 DIAGNOSIS — K625 Hemorrhage of anus and rectum: Secondary | ICD-10-CM | POA: Diagnosis not present

## 2015-12-17 DIAGNOSIS — Z23 Encounter for immunization: Secondary | ICD-10-CM | POA: Diagnosis not present

## 2015-12-17 DIAGNOSIS — Z1629 Resistance to other single specified antibiotic: Secondary | ICD-10-CM | POA: Diagnosis not present

## 2015-12-17 DIAGNOSIS — J449 Chronic obstructive pulmonary disease, unspecified: Secondary | ICD-10-CM | POA: Diagnosis not present

## 2015-12-17 DIAGNOSIS — R627 Adult failure to thrive: Secondary | ICD-10-CM | POA: Diagnosis not present

## 2015-12-17 DIAGNOSIS — E559 Vitamin D deficiency, unspecified: Secondary | ICD-10-CM | POA: Diagnosis not present

## 2015-12-17 DIAGNOSIS — F339 Major depressive disorder, recurrent, unspecified: Secondary | ICD-10-CM | POA: Diagnosis not present

## 2015-12-17 DIAGNOSIS — I4891 Unspecified atrial fibrillation: Secondary | ICD-10-CM | POA: Diagnosis not present

## 2015-12-17 DIAGNOSIS — I5042 Chronic combined systolic (congestive) and diastolic (congestive) heart failure: Secondary | ICD-10-CM | POA: Diagnosis not present

## 2015-12-17 DIAGNOSIS — K219 Gastro-esophageal reflux disease without esophagitis: Secondary | ICD-10-CM | POA: Diagnosis not present

## 2015-12-17 DIAGNOSIS — E785 Hyperlipidemia, unspecified: Secondary | ICD-10-CM | POA: Diagnosis not present

## 2015-12-17 DIAGNOSIS — I2510 Atherosclerotic heart disease of native coronary artery without angina pectoris: Secondary | ICD-10-CM | POA: Diagnosis not present

## 2015-12-17 DIAGNOSIS — N183 Chronic kidney disease, stage 3 (moderate): Secondary | ICD-10-CM | POA: Diagnosis not present

## 2015-12-17 DIAGNOSIS — E0590 Thyrotoxicosis, unspecified without thyrotoxic crisis or storm: Secondary | ICD-10-CM | POA: Diagnosis not present

## 2015-12-17 DIAGNOSIS — J90 Pleural effusion, not elsewhere classified: Secondary | ICD-10-CM | POA: Diagnosis not present

## 2015-12-17 DIAGNOSIS — B9620 Unspecified Escherichia coli [E. coli] as the cause of diseases classified elsewhere: Secondary | ICD-10-CM | POA: Diagnosis not present

## 2015-12-17 DIAGNOSIS — R630 Anorexia: Secondary | ICD-10-CM | POA: Diagnosis not present

## 2015-12-17 DIAGNOSIS — I130 Hypertensive heart and chronic kidney disease with heart failure and stage 1 through stage 4 chronic kidney disease, or unspecified chronic kidney disease: Secondary | ICD-10-CM | POA: Diagnosis not present

## 2015-12-17 DIAGNOSIS — N179 Acute kidney failure, unspecified: Secondary | ICD-10-CM | POA: Diagnosis not present

## 2015-12-17 DIAGNOSIS — I8290 Acute embolism and thrombosis of unspecified vein: Secondary | ICD-10-CM | POA: Diagnosis not present

## 2015-12-17 NOTE — NC FL2 (Signed)
[place] LEVEL OF CARE SCREENING TOOL     IDENTIFICATION  Patient Name: [name] Birthdate: December 28, 1945 Sex: male Admission Date (Current Location): 12/15/2015  [facility] and [place] Number:  Herbalist and Address:  [facility] [place], [place] [address] [place], [place] [address]      Provider Number: [id number]  Attending Physician Name and Address:  Oval Linsey, MD  Relative Name and Phone Number:       Current Level of Care: Hospital Recommended Level of Care: [place] Prior Approval Number:    Date Approved/Denied:   PASRR Number: [id number]  Discharge Plan: SNF    Current Diagnoses: Patient Active Problem List   Diagnosis Date Noted  . Major depression, recurrent, chronic ([place])   . Acute cystitis without hematuria   . Hyperkalemia 12/15/2015  . Hydrocele of testis   . Asbestosis ([place])   . CKD (chronic kidney disease)   . Coagulopathy ([place])   . Long term current use of opiate analgesic 06/27/2015  . Decreased appetite 06/19/2015  . Neuropathic pain of both feet ([place]) 05/03/2015  . Chronic pain 12/13/2014  . AKI (acute kidney injury) ([place])   . Longstanding persistent atrial fibrillation ([place]) 10/11/2014  . TIA (transient ischemic attack) 10/11/2014  . Healthcare maintenance 01/25/2014  . BPH (benign prostatic hyperplasia) 01/20/2014  . PVD (peripheral vascular disease) ([place]) 12/10/2013  . Chronic combined systolic and diastolic congestive heart failure ([place]) 06/20/2010  . Long-term (current) use of anticoagulants 06/05/2010  . H/O Arterial embolism of leg  06/05/2010  . Depression 01/19/2010  . Chronic kidney disease (CKD), stage III (moderate) 11/18/2009  . History of hyperthyroidism 03/13/2009  . Bilateral hip pain 03/23/2007  . OSTEOARTHRITIS 09/05/2006  . G E R D 09/04/2006  . COPD (chronic obstructive pulmonary disease) ([place]) 08/28/2006  . ERECTILE DYSFUNCTION 03/01/2006  . History of  tobacco abuse 03/01/2006  . Essential hypertension 03/01/2006  . Cardiac pacemaker in situ 03/01/2006    Orientation RESPIRATION BLADDER Height & Weight     Self, Time, Situation, Place  Normal Continent Weight: 146 lb 9.7 oz (66.5 kg) Height:  6\' 3"  (190.5 cm)  BEHAVIORAL SYMPTOMS/MOOD NEUROLOGICAL BOWEL NUTRITION STATUS      Continent Diet (regular)  AMBULATORY STATUS COMMUNICATION OF NEEDS Skin   Limited Assist Verbally                         Personal Care Assistance Level of Assistance  Bathing, Feeding, Dressing Bathing Assistance: Limited assistance Feeding assistance: Independent Dressing Assistance: Limited assistance     Functional Limitations Info  Sight, Hearing, Speech Sight Info: Adequate Hearing Info: Adequate Speech Info: Adequate    SPECIAL CARE FACTORS FREQUENCY  PT (By licensed PT)     PT Frequency: 5x week              Contractures Contractures Info: Not present    Additional Factors Info  Code Status, Isolation Precautions Code Status Info: Full Allergies Info: Amiodarone, Penicillins       Isolation Precautions Info: MRSA infection      Current Medications (12/17/2015):  This is the current hospital active medication list Current Facility-Administered Medications  Medication Dose Route Frequency Provider Last Rate Last Dose  . acetaminophen (TYLENOL) tablet 650 mg  650 mg Oral Q6H PRN Zada Finders, MD       Or  . acetaminophen (TYLENOL) suppository 650 mg  650 mg Rectal Q6H  PRN Zada Finders, MD      . albuterol (PROVENTIL) (2.5 MG/3ML) 0.083% nebulizer solution 2.5 mg  2.5 mg Inhalation Q6H PRN Zada Finders, MD      . apixaban (ELIQUIS) tablet 5 mg  5 mg Oral BID Zada Finders, MD   5 mg at 12/17/15 0914  . aspirin chewable tablet 81 mg  81 mg Oral Daily Zada Finders, MD   81 mg at 12/17/15 0913  . buPROPion [facility]) tablet 75 mg  75 mg Oral BID Zada Finders, MD   75 mg at 12/17/15 0913  . cholecalciferol (VITAMIN D) tablet  1,000 Units  1,000 Units Oral Daily Zada Finders, MD   1,000 Units at 12/17/15 0914  . ciprofloxacin (CIPRO) tablet 250 mg  250 mg Oral BID Minus Liberty, MD   250 mg at 12/17/15 0913  . diltiazem (CARDIZEM CD) 24 hr capsule 120 mg  120 mg Oral Daily Minus Liberty, MD   120 mg at 12/17/15 0913  . feeding supplement (BOOST / RESOURCE BREEZE) liquid 1 Container  1 Container Oral TID BM Oval Linsey, MD   1 Container at 12/17/15 [phone]  . feeding supplement (NEPRO CARB STEADY) liquid 237 mL  237 mL Oral Q24H Oval Linsey, MD   237 mL at 12/16/15 1331  . metoprolol succinate (TOPROL-XL) 24 hr tablet 100 mg  100 mg Oral Daily Zada Finders, MD   100 mg at 12/17/15 0906  . mirtazapine (REMERON) tablet 30 mg  30 mg Oral QHS Asencion Partridge, MD   30 mg at 12/16/15 2124  . oxyCODONE-acetaminophen (PERCOCET/ROXICET) 5-325 MG per tablet 1 tablet  1 tablet Oral Q4H PRN Oval Linsey, MD   1 tablet at 12/17/15 0912   And  . oxyCODONE (Oxy IR/ROXICODONE) immediate release tablet 5 mg  5 mg Oral Q4H PRN Oval Linsey, MD   5 mg at 12/17/15 0913  . pantoprazole (PROTONIX) EC tablet 40 mg  40 mg Oral Daily Zada Finders, MD   40 mg at 12/17/15 0913  . rosuvastatin (CRESTOR) tablet 20 mg  20 mg Oral q1800 Zada Finders, MD   20 mg at 12/16/15 1644  . senna-docusate (Senokot-S) tablet 1 tablet  1 tablet Oral Daily PRN Zada Finders, MD   1 tablet at 12/17/15 0913  . sodium chloride flush (NS) 0.9 % injection 3 mL  3 mL Intravenous Q12H Zada Finders, MD   3 mL at 12/17/15 0914  . tiotropium (SPIRIVA) inhalation capsule 18 mcg  18 mcg Inhalation Daily Zada Finders, MD   18 mcg at 12/17/15 1019     Discharge Medications: Please see discharge summary for a list of discharge medications.  Relevant Imaging Results:  Relevant Lab Results:   Additional Information SSN:  [id number]  [place], [place]

## 2015-12-17 NOTE — Care Management (Signed)
RNCM reviewed record Patient seen by PT recommending SNF placement, CSW is exploring SNF placement

## 2015-12-17 NOTE — Progress Notes (Addendum)
LCSW received call from attending that patient is medically stable for DC and SNF is being recommended. Active order placed today by attending.    Per chart review, patient has been referred in past and typically goes home with home health.  LCSW completed work up, contacted insurance to review if appropriate for placement and will follow up.  Placement process has started.  Referred to [facility]. Patient at this time is agreeable to SNF.  Will follow up with bed offers and insurance determination once available.  LCSW has called 2 additional times insurance for review and referral for possible SNF placement.  Message left again for CM for [place].  Lane Hacker, MSW Clinical Social Work: [employer] [phone]

## 2015-12-17 NOTE — Progress Notes (Signed)
Internal Medicine Attending  Date: 12/17/2015  Patient name: [name] Medical record number: [id number] Date of birth: 1945-11-29 Age: 70 y.o. Gender: male  I saw and evaluated the patient. I reviewed the resident's note by Dr. Philipp Ovens and I agree with the resident's findings and plans as documented in her progress note.  Mr. Kusnierz continues to complain of generalized weakness and feeling he is not ready to go home. Yesterday he initially failed to work with physical therapy but later in the evening they were able to perform a cursory assessment and recommended skilled nursing facility placement. He is now willing to go to skilled nursing facility to work on rehabilitation and improve his overall strength. I also believe a big component of his symptoms are his major depression. We had increased his mirtazapine in order to address this issue and stimulate his appetite. This intervention will continue. It is my opinion he is safe for discharge today or whenever a skilled nursing facility bed becomes available.

## 2015-12-17 NOTE — Progress Notes (Signed)
   Subjective: Overall is feeling better this morning - only complains of feeling sleepy. Appetite has improved. Patient is adamant that is is not ready to go home due to feeling "weak". Discussed with patient the importance of working with PT to regain his strength. Also discussed the risks associated with staying in the hospital and the benefits of going to a SNF. Patient expressed understanding and is agreeable to SNF on discharge.   Objective:  Vital signs in last 24 hours: Vitals:   12/16/15 1809 12/16/15 2125 12/16/15 2258 12/17/15 0440  BP: 110/66 (!) 117/53 107/71 105/71  Pulse: 67 (!) 51 76 74  Resp: 18 18 18 18   Temp: 98.7 F (37.1 C) 97.9 F (36.6 C) 97.9 F (36.6 C) 97.8 F (36.6 C)  TempSrc: Oral Oral Oral Oral  SpO2: 99% 97% 100% 100%  Weight:      Height:       Physical Exam Constitutional: NAD, appears comfortable HEENT: Atraumatic, normocephalic. PERRL, anicteric sclera.  Neck: Supple, trachea midline.  Cardiovascular: irregular rhythm, normal rate, no murmurs, rubs, or gallops.  Pulmonary/Chest: Mild expiratory wheezing in all lung fields; no rales or rhonchi. Abdominal: Soft, non tender, non distended. +BS.  Extremities: Warm and well perfused. Distal pulses intact. No edema.  Neurological: A&Ox3, CN II - XII grossly intact.  Skin: No rashes or erythema  Psychiatric: Normal mood and affect  Assessment/Plan: 70 yo M with a pmhx of a-fib, chronic combined HF, CKD3, COPD presenting with decreased appetite and depression.  MDD:  -- Continue Mirtazapine 30 QHS -- Continue Buproprion 75 BID -- Social work and Transport planner consult for SNF placement -- PCP will consider PACE program  AKI on CKD3, hyperkalemia: Received IV fluids overnight on admission. Hypokalemia resolved and creatinine down to 2.14 from 2.36. Baseline is around 2.  UTI: Continue cipro 250mg  BID day 2 of 7 days.  Rectal bleeding, fecal incontinence: Follow up at outpatient  visit.  Chronic Systolic CHF: Restart Lasix at discharge. Continue home metoprolol. No ACE given renal dysfunction  Chronic Atrial Fibrillation: CHADSVASC of at least 3. Continue home diltiazem and metoprolol. Continue Eliquis.  Vitamin D Deficiency: Continue home vitamin D supplementation  GERD: Continue home PPI as Protonix 40mg  daily  HLD: Continue home Crestor.  COPD: Continue home tiotropium  Dispo: Anticipated discharge in approximately 1-2 day(s).   LOS: 1 day   Velna Ochs, MD 12/17/2015, 7:26 AM Pager: [phone]

## 2015-12-17 NOTE — Progress Notes (Signed)
[facility] Case manager for SNF returned call at 3:20pm. She is reviewing case and will call back if patient is approved for SNF.  Will follow up with bed offers if approved. Lane Hacker, MSW Clinical Social Work: [employer]

## 2015-12-18 ENCOUNTER — Ambulatory Visit: Payer: Commercial Managed Care - HMO

## 2015-12-18 DIAGNOSIS — K625 Hemorrhage of anus and rectum: Secondary | ICD-10-CM | POA: Diagnosis not present

## 2015-12-18 DIAGNOSIS — N179 Acute kidney failure, unspecified: Secondary | ICD-10-CM | POA: Diagnosis not present

## 2015-12-18 DIAGNOSIS — I8290 Acute embolism and thrombosis of unspecified vein: Secondary | ICD-10-CM | POA: Diagnosis not present

## 2015-12-18 DIAGNOSIS — N390 Urinary tract infection, site not specified: Secondary | ICD-10-CM | POA: Diagnosis not present

## 2015-12-18 DIAGNOSIS — F339 Major depressive disorder, recurrent, unspecified: Principal | ICD-10-CM

## 2015-12-18 DIAGNOSIS — N183 Chronic kidney disease, stage 3 (moderate): Secondary | ICD-10-CM

## 2015-12-18 DIAGNOSIS — I130 Hypertensive heart and chronic kidney disease with heart failure and stage 1 through stage 4 chronic kidney disease, or unspecified chronic kidney disease: Secondary | ICD-10-CM | POA: Diagnosis not present

## 2015-12-18 DIAGNOSIS — J90 Pleural effusion, not elsewhere classified: Secondary | ICD-10-CM | POA: Diagnosis not present

## 2015-12-18 DIAGNOSIS — I5042 Chronic combined systolic (congestive) and diastolic (congestive) heart failure: Secondary | ICD-10-CM | POA: Diagnosis not present

## 2015-12-18 LAB — URINE CULTURE: Special Requests: NORMAL

## 2015-12-18 MED ORDER — CIPROFLOXACIN HCL 250 MG PO TABS: 250.0000 mg | ORAL_TABLET | Freq: Two times a day (BID) | ORAL | 0 refills | Status: DC

## 2015-12-18 NOTE — Progress Notes (Signed)
   Subjective: Patient feels much better today. Says his energy level has improved and was sitting up the the chair out of bed. He worked with PT earlier today and is gaining some confidence back. Reports improvement in his appetite with increased dose of mirtazapine.   Objective:  Vital signs in last 24 hours: Vitals:   12/17/15 1700 12/17/15 2131 12/18/15 0535 12/18/15 0930  BP: 99/78 (!) 128/112 99/73 (!) 101/52  Pulse: 78 84 71 69  Resp: 18 18 18 18   Temp: 98.9 F (37.2 C) 98.9 F (37.2 C) 97.6 F (36.4 C) 99 F (37.2 C)  TempSrc: Oral Oral Oral Oral  SpO2: 95% 95% 99% 99%  Weight:  148 lb 4.8 oz (67.3 kg)    Height:       Physical Exam Constitutional: NAD, appears comfortable HEENT: Atraumatic, normocephalic. PERRL, anicteric sclera.  Neck: Supple, trachea midline.  Cardiovascular: irregular rhythm, normal rate, no murmurs, rubs, or gallops.  Pulmonary/Chest: CTAB; no rales or rhonchi. Abdominal: Soft, non tender, non distended. +BS.  Extremities: Warm and well perfused. Distal pulses intact. No edema.  Neurological: A&Ox3, CN II - XII grossly intact.  Skin: No rashes or erythema  Psychiatric: Normal mood and affect  Assessment/Plan: 70 yo M with a pmhx of a-fib, chronic combined HF, CKD3, COPD presenting with decreased appetite and depression.  MDD/General Deconditioning:  -- Continue Mirtazapine 30 QHS -- Continue Buproprion 75 BID -- Social work and Transport planner consult for SNF placement -- PCP will consider PACE program -- Continue PT  AKI on CKD3, hyperkalemia: Received IV fluids overnight on admission. Hypokalemia resolved and creatinine down to 2.14 from 2.36. Baseline is around 2.  Complicated UTI: In the setting of CKD. UA with positive nitrites, leukocytes, and many bacteria. Urine Cx with >100,000 E Coli. Resistant to bactrim. Continue cipro 250mg  BID day 3 of 7.  Rectal bleeding, fecal incontinence: Follow up at outpatient visit.  Chronic  Systolic CHF: Restart Lasix at discharge. Continue home metoprolol. No ACE given renal dysfunction  Chronic Atrial Fibrillation: CHADSVASC of at least 3. Continue home diltiazem and metoprolol. Continue Eliquis.  Vitamin D Deficiency: Continue home vitamin D supplementation  GERD: Continue home PPI as Protonix 40mg  daily  HLD: Continue home Crestor.  COPD: Continue home tiotropium  Dispo: Anticipated discharge in approximately 1-2day(s).   LOS: 1 day   Velna Ochs, MD 12/18/2015, 11:39 AM Pager: [phone]

## 2015-12-18 NOTE — Progress Notes (Signed)
Discharge given as ordered and patient knows about heart failure this is not new to him and s/s of infections  Heart disease

## 2015-12-18 NOTE — Progress Notes (Signed)
  Date: 12/18/2015  Patient name: [name]  Medical record number: [id number]  Date of birth: February 02, 1946   Patient seen and examined. Case d/w residents in detail. I agree with findings and plan as documented in Dr. Rivka Safer note.  Patient continues to improve. No new complaints. AKI improved with IVF. PT f/u appreciated. Patient stable for d/c home today with home health PT. Will need to complete 7 day course of Cipro for E. Coli UTI    Aldine Contes, MD 12/18/2015, 2:23 PM

## 2015-12-18 NOTE — Care Management Note (Addendum)
Case Management Note  Patient Details  Name: [name] MRN: [id number] Date of Birth: Sep 13, 1945  Subjective/Objective:       CM following for progression and d/c planning.              Action/Plan: Pt previously active with [facility] for special followup, new orders faxed for this pt and pt provided with list of [facility] aide agencies, pt understands that he will be responsible for arranging this if he is interested as this is private pay.    Expected Discharge Date:     12/18/2015             Expected Discharge Plan:  [place]  In-House Referral:  Clinical Social Work  Discharge planning Services  CM Consult  Post Acute Care Choice:  NA Choice offered to:  NA  DME Arranged:  N/A DME Agency:  NA  HH Arranged:  RN, PT and OT HH Agency:  [place]  Status of Service:  Completed, signed off  If discussed at [place] of Stay Meetings, dates discussed:    Additional Comments:  Adron Bene, RN 12/18/2015, 1:44 PM

## 2015-12-18 NOTE — Clinical Social Work Note (Signed)
CSW continued conversation with patient regarding skilled facility placement and provided him with facility responses. [name] had 3 facilities that offered a bed for rehab, however he declined each facility. Patient decided to discharge home with [facility] services and this was discussed as well as possibly paying privately for additional nursing aide services. [name] was provided with [facility] and home health services information by nurse case manager. CSW signing off, however please reconsult if needed prior to patient discharge.  [place] Liskey Givens, MSW, LCSW Licensed Clinical Social Worker [place] [phone]

## 2015-12-18 NOTE — Progress Notes (Signed)
Physical Therapy Treatment Patient Details Name: [name] MRN: [id number] DOB: [birth year] Today's Date: 12/18/2015    History of Present Illness Patient is a 70 yo male admitted 12/15/15 with major depression, weight loss, hyperkalemia.  PMH:  depression, Afib, CHF, CKD, COPD, PVD    PT Comments    Pt able to ambulate 55 feet with rw and min guard assist. Pt reports feeling tired with activity but hoping to be able to go to SNF for more rehabilitation. PT to continue to follow and advance acutely.   Follow Up Recommendations  SNF;Supervision for mobility/OOB;Home health PT     Equipment Recommendations  None recommended by PT    Recommendations for Other Services       Precautions / Restrictions Precautions Precautions: Fall Restrictions Weight Bearing Restrictions: No    Mobility  Bed Mobility Overal bed mobility: Modified Independent             General bed mobility comments: using rail to come from supine to sitting  Transfers Overall transfer level: Needs assistance Equipment used: Rolling walker (2 wheeled) Transfers: Sit to/from Stand Sit to Stand: Supervision         General transfer comment: supervision for safety  Ambulation/Gait Ambulation/Gait assistance: Min guard Ambulation Distance (Feet): 55 Feet Assistive device: Rolling walker (2 wheeled) Gait Pattern/deviations: Step-through pattern Gait velocity: decreased   General Gait Details: mild instability but no gross loss of balance. Pt requesting seated rest due to fatigue.    Stairs            Wheelchair Mobility    Modified Rankin (Stroke Patients Only)       Balance Overall balance assessment: Needs assistance Sitting-balance support: No upper extremity supported Sitting balance-Leahy Scale: Good     Standing balance support: Bilateral upper extremity supported Standing balance-Leahy Scale: Poor Standing balance comment: using rw                     Cognition Arousal/Alertness: Awake/alert Behavior During Therapy: Flat affect Overall Cognitive Status: Within Functional Limits for tasks assessed                      Exercises      General Comments        Pertinent Vitals/Pain Pain Assessment: 0-10 Pain Score: 6  Pain Location: hips Pain Descriptors / Indicators: Aching Pain Intervention(s): Limited activity within patient's tolerance;Monitored during session    Home Living                      Prior Function            PT Goals (current goals can now be found in the care plan section) Acute Rehab PT Goals Patient Stated Goal: Move better and get back to being independent.  PT Goal Formulation: With patient Time For Goal Achievement: 12/23/15 Potential to Achieve Goals: Good Progress towards PT goals: Progressing toward goals    Frequency  Min 3X/week    PT Plan Current plan remains appropriate    Co-evaluation             End of Session Equipment Utilized During Treatment: Gait belt Activity Tolerance: Patient tolerated treatment well Patient left: in chair;with call bell/phone within reach;with chair alarm set     Time: [id number] PT Time Calculation (min) (ACUTE ONLY): 21 min  Charges:  $Gait Training: 8-22 mins  G Codes:      Cassell Clement, PT, CSCS Pager [phone] Office [phone]  12/18/2015, 9:04 AM

## 2015-12-18 NOTE — Care Management Obs Status (Signed)
[place] NOTIFICATION   Patient Details  Name: [name] MRN: [id number] Date of Birth: 21-Feb-1946   Medicare Observation Status Notification Given:  Yes    Levy Cedano, Rory Percy, RN 12/18/2015, 4:39 PM

## 2015-12-19 ENCOUNTER — Telehealth: Payer: Self-pay | Admitting: Internal Medicine

## 2015-12-19 ENCOUNTER — Other Ambulatory Visit: Payer: Self-pay | Admitting: *Deleted

## 2015-12-19 DIAGNOSIS — I5042 Chronic combined systolic (congestive) and diastolic (congestive) heart failure: Secondary | ICD-10-CM | POA: Diagnosis not present

## 2015-12-19 DIAGNOSIS — F319 Bipolar disorder, unspecified: Secondary | ICD-10-CM | POA: Diagnosis not present

## 2015-12-19 DIAGNOSIS — I130 Hypertensive heart and chronic kidney disease with heart failure and stage 1 through stage 4 chronic kidney disease, or unspecified chronic kidney disease: Secondary | ICD-10-CM | POA: Diagnosis not present

## 2015-12-19 DIAGNOSIS — I739 Peripheral vascular disease, unspecified: Secondary | ICD-10-CM | POA: Diagnosis not present

## 2015-12-19 DIAGNOSIS — I481 Persistent atrial fibrillation: Secondary | ICD-10-CM | POA: Diagnosis not present

## 2015-12-19 DIAGNOSIS — J449 Chronic obstructive pulmonary disease, unspecified: Secondary | ICD-10-CM | POA: Diagnosis not present

## 2015-12-19 DIAGNOSIS — N183 Chronic kidney disease, stage 3 (moderate): Secondary | ICD-10-CM | POA: Diagnosis not present

## 2015-12-19 DIAGNOSIS — R918 Other nonspecific abnormal finding of lung field: Secondary | ICD-10-CM | POA: Diagnosis not present

## 2015-12-19 DIAGNOSIS — F1010 Alcohol abuse, uncomplicated: Secondary | ICD-10-CM | POA: Diagnosis not present

## 2015-12-19 NOTE — Telephone Encounter (Signed)
Nurse Case Manager from [facility] requesting a call back.

## 2015-12-19 NOTE — Patient Outreach (Signed)
[place] [facility]) Care Management  12/19/2015  SABRE MORIOKA [birth year] [id number]   Initial attempt to reach pt post hospital discharge unsuccessful. Will continue attempts to reach this pt.  [name], RN Care Management Coordinator [place] Office [phone]

## 2015-12-20 ENCOUNTER — Other Ambulatory Visit: Payer: Self-pay | Admitting: *Deleted

## 2015-12-20 DIAGNOSIS — N19 Unspecified kidney failure: Secondary | ICD-10-CM | POA: Diagnosis not present

## 2015-12-20 DIAGNOSIS — I4891 Unspecified atrial fibrillation: Secondary | ICD-10-CM | POA: Diagnosis not present

## 2015-12-20 DIAGNOSIS — J449 Chronic obstructive pulmonary disease, unspecified: Secondary | ICD-10-CM | POA: Diagnosis not present

## 2015-12-20 DIAGNOSIS — J129 Viral pneumonia, unspecified: Secondary | ICD-10-CM | POA: Diagnosis not present

## 2015-12-20 NOTE — Patient Outreach (Signed)
[place] [facility]) Care Management  12/20/2015  [name] July 19, 1945 [id number]   RN attempted another outreach call to pt today however unsuccessful. RN able to leave a HIPAA approved voice message requesting pt to return the call. Will to follow up and schedule another transition of care call based upon pt's recent discharge from the hospital on 7/31.  [name], RN Care Management Coordinator [place] Office [phone]

## 2015-12-20 NOTE — Telephone Encounter (Signed)
Spoke with Kasandra Knudsen who was wanting some clarification on patients current med. List. Verified that patient was discharged on metoprolol 100 mg. Daily and Cardizem 120 mg. Those where the medications he was unsure of. Scheduled follow up appointment with [place] for the patient on 12/26/15 PM.

## 2015-12-21 ENCOUNTER — Other Ambulatory Visit: Payer: Self-pay | Admitting: *Deleted

## 2015-12-21 NOTE — Patient Outreach (Signed)
[place] [facility]) Care Management  12/21/2015  JAQUES NADEL 1946-03-23 [id number]  RN received a voice message from pt this morning from a call left to voice message on yesterday. Pt reports discharge from the hospital and requested RN to return a call to him for possible needs. Will follow up with pt once again today and inquire further.  [name], RN Care Management Coordinator [place] Office [phone]

## 2015-12-21 NOTE — Patient Outreach (Addendum)
[place] [facility]) Care Management  12/21/2015  [name] 11-19-1945 [id number]   RN spoke with pt indicates his recently discharged from the hospital and was treated for the following issues: UTI, Dehydration, antibiotics and IV fluids. Pt was offered post-op discharge for rehab but refused and decided to return home. Pt states he feels a lot better and doing well and received his scales and will start weighing today as strongly encouraged by this RN case manager. Pt states he will starting weighing and will document all readings for providers to view. Verified all medications via reviewed each medication's purpose with pt understanding. RN offered an earlier home visit based upon his recent admit however pt feels he is able to resume weights and monitor his HF after review today of his plan of care and goals. Pt receptive to continue the ongoing transition of care calls. RN will continues to review and discuss the plan of care with strongly encouragements on pt's behalf for adherence to the plan. Follow up call scheduled for next week with ongoing transition of care.   Patient was recently discharged from hospital and all medications have been reviewed.  [name], RN Care Management Coordinator [place] Office [phone]

## 2015-12-25 ENCOUNTER — Telehealth: Payer: Self-pay | Admitting: Internal Medicine

## 2015-12-25 NOTE — Telephone Encounter (Signed)
APT. REMINDER CALL, LMTCB °

## 2015-12-26 ENCOUNTER — Ambulatory Visit: Payer: Commercial Managed Care - HMO

## 2015-12-27 ENCOUNTER — Other Ambulatory Visit: Payer: Self-pay | Admitting: *Deleted

## 2015-12-27 NOTE — Patient Outreach (Signed)
[facility]) Care Management  12/27/2015  DONAL ROHAL May 14, 1946 [id number]   RN attempted outreach call to pt today however pt not available. RN able to leave a HIPAA approved voice message requesting a call back. Will inquire further on the return call on pt's ongoing needs. Will also rescheduled today's call.  [name], RN Care Management Coordinator [place] Office [phone]

## 2016-01-01 ENCOUNTER — Encounter: Payer: Self-pay | Admitting: *Deleted

## 2016-01-01 ENCOUNTER — Other Ambulatory Visit: Payer: Self-pay | Admitting: *Deleted

## 2016-01-01 NOTE — Patient Outreach (Signed)
[facility]) Care Management  01/01/2016  [name] 11-27-1945 [id number]   RN attempted outreach all today however only able to leave a HIPAA approved voice message. Will inquire further on pt's ongoing management of care.  Will continue to schedule follow up call.  [name], RN Care Management Coordinator [place] Office [phone]

## 2016-01-01 NOTE — Patient Outreach (Signed)
[name]) Care Management  01/01/2016  GASPARE MILLWEE 09/20/1945 [id number]  CSW received a new referral on patient from patient's RNCM with [place] [facility], [name] indicating that patient would benefit from social work services and resources to assist with completion of an Adult Medicaid application through the [facility].  [name] further reports that patient would benefit from assistance with completion of a referral to [employer] through [employer] [facility], as well as assistance with completion of an application for [employer] Youth worker) through [name]. CSW made an initial attempt to try and contact patient today to perform phone assessment, as well as assess and assist with social work needs and services, without success.  A HIPAA compliant message was left on voicemail.  CSW is currently awaiting a return call. [name], BSW, MSW, LCSW  Licensed Education officer, environmental Health System  Mailing [place] [address] [place], [place] [address] Physical Address-[address], [place], [place] [address] Toll Free Main # [phone] Fax # [phone] Cell # [phone]  Fax # [phone]  [name]

## 2016-01-03 IMAGING — CR DG CHEST 1V PORT
2 series · 2 of 2 positions shown · non-contrast
Comparison: 01/17/2014

CLINICAL DATA: Right facial droop

EXAM:
PORTABLE CHEST - 1 VIEW

[ap portable (1 of 2)]
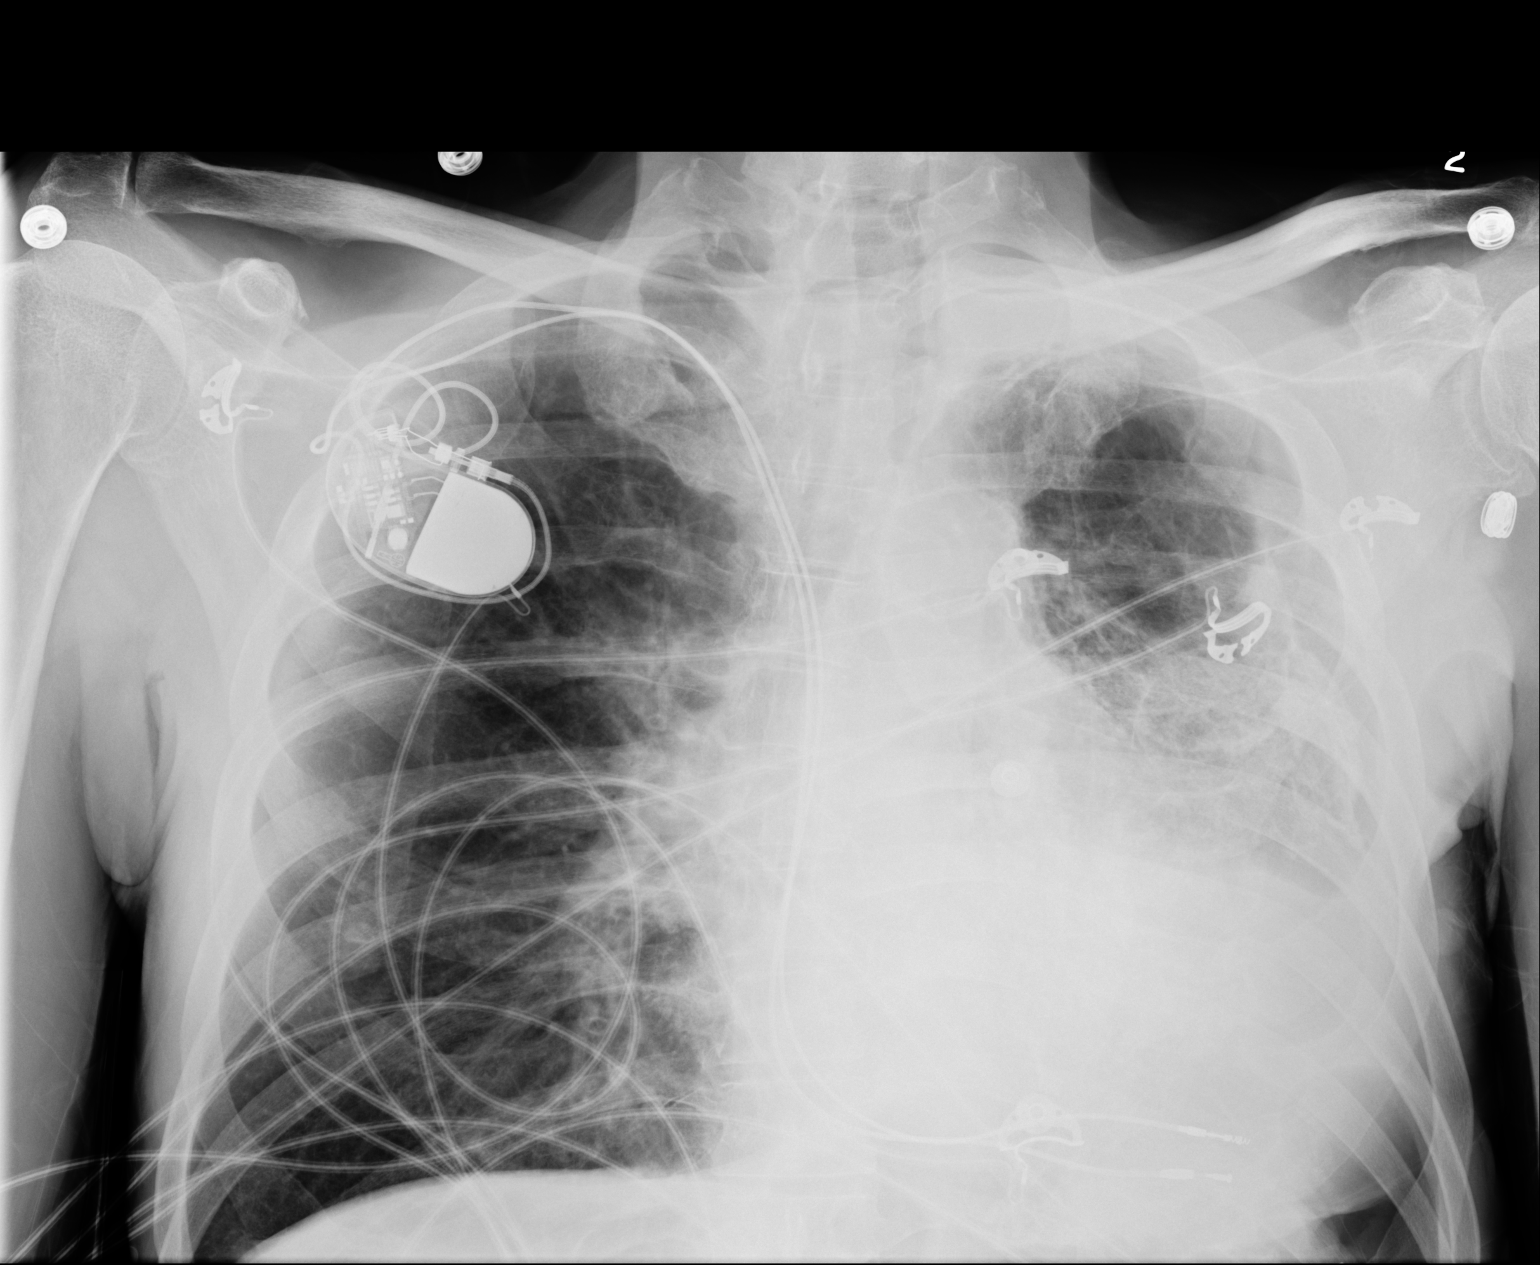

[ap portable (2 of 2)]
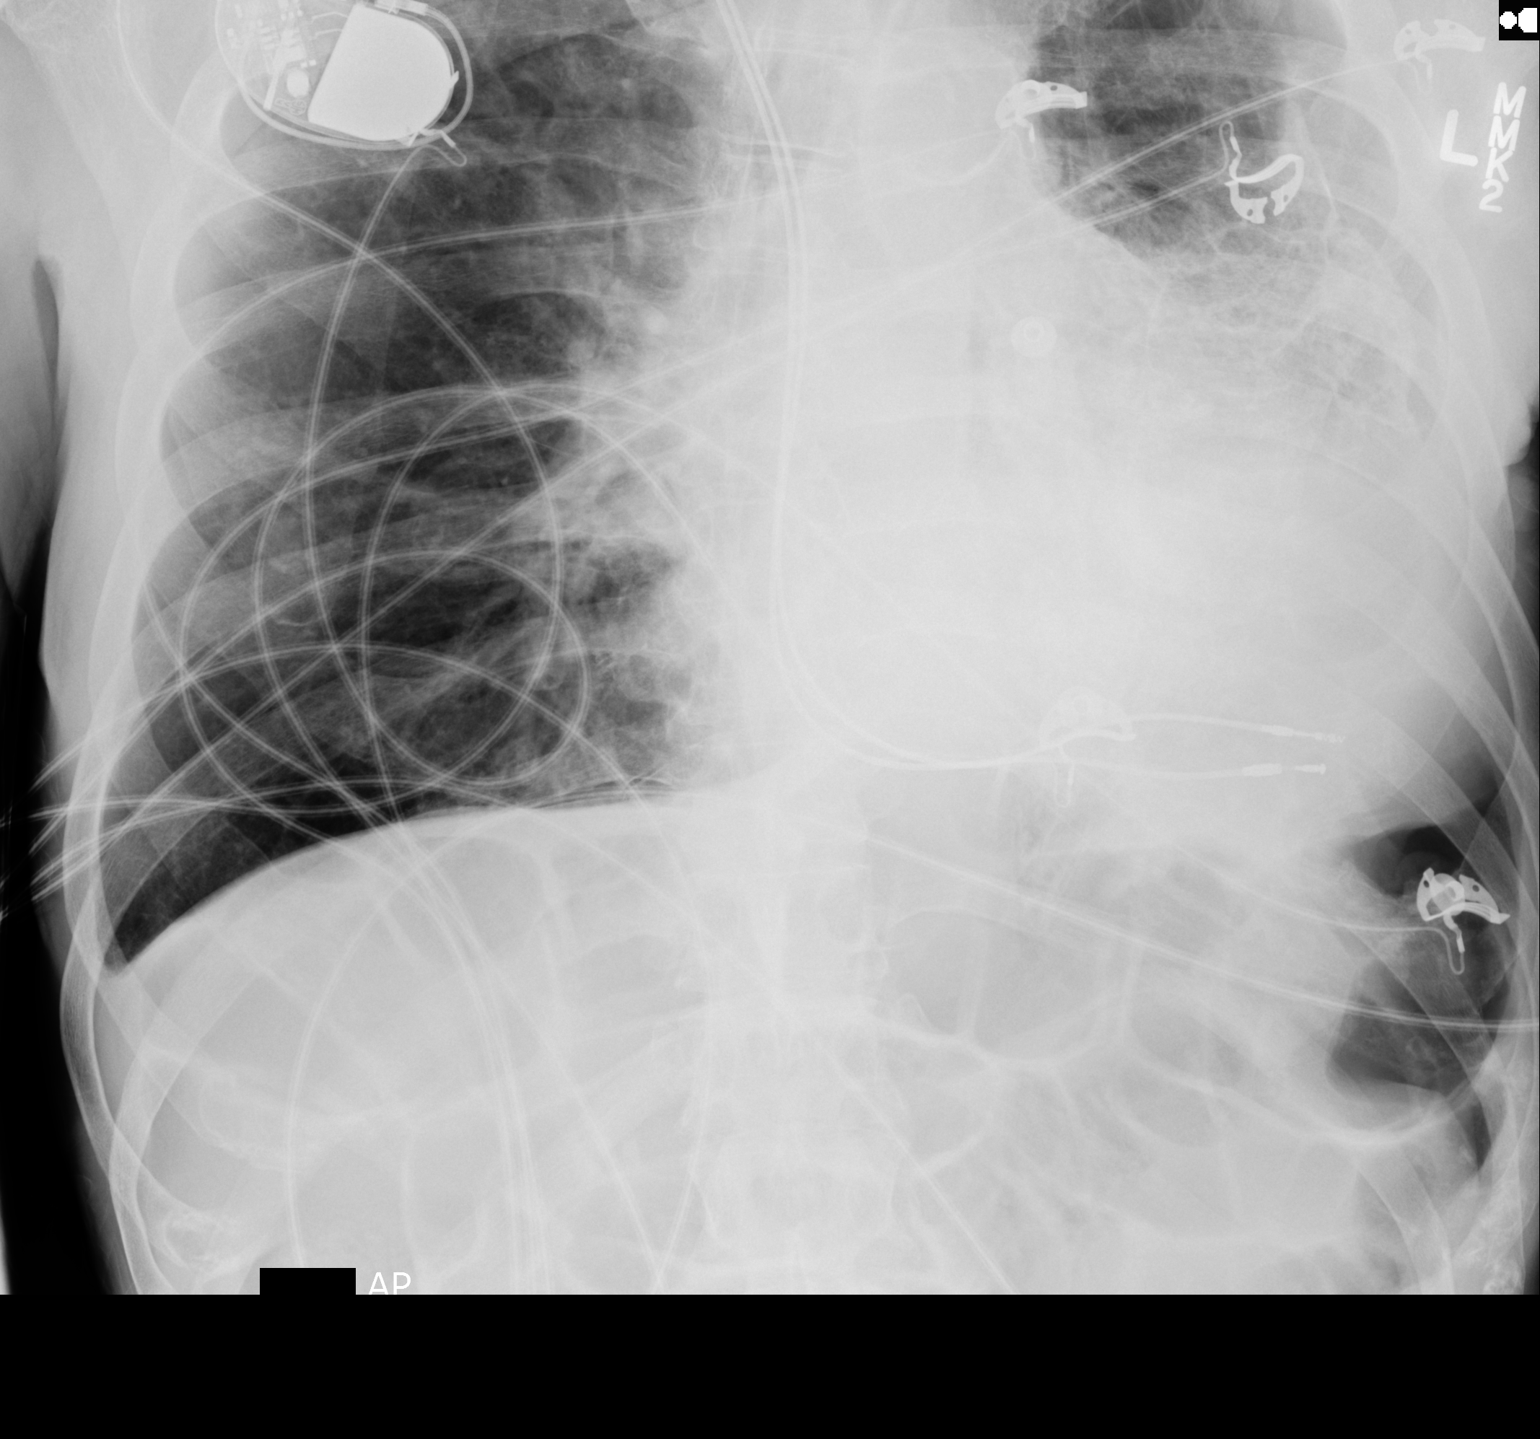

[2 of 2 positions shown; findings below may reference images not displayed]

FINDINGS: Cardiac shadow is again mildly enlarged. A pacing device is again
seen. Chronic left pleural effusion and left basilar changes are
noted although they have increased in the interval from the prior
exam. The right lung remains clear. The visualized upper abdomen is
unremarkable.
IMPRESSION: Increasing chronic changes in the left lung base when compare with
the prior exam. Calcified pleural plaques are again noted.

## 2016-01-03 IMAGING — CT CT HEAD W/O CM
1 series · 16 of 30 positions shown, 20 images · non-contrast
Comparison: CT head 10/12/2012.

CLINICAL DATA: Code stroke. RIGHT-sided facial droop with slurred
speech.

EXAM:
CT HEAD WITHOUT CONTRAST
TECHNIQUE: Contiguous axial images were obtained from the base of the skull
through the vertex without intravenous contrast.

[Series 2: head 5.0 h30s · axial · 0.46mm/px · z∈[-79,+56]mm · 16 of 30 slices shown, 20 images]
[im 2/30  brain]
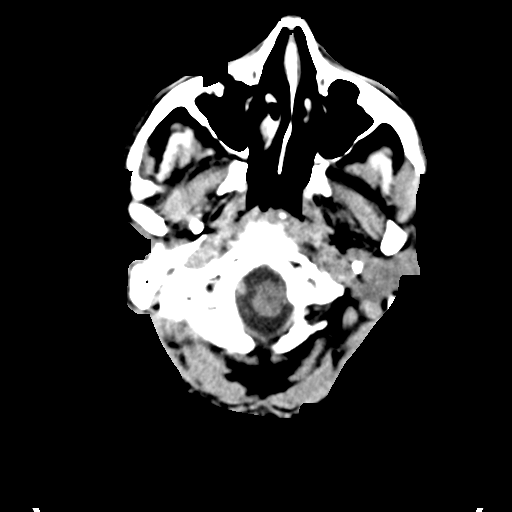
[im 2/30  bone]
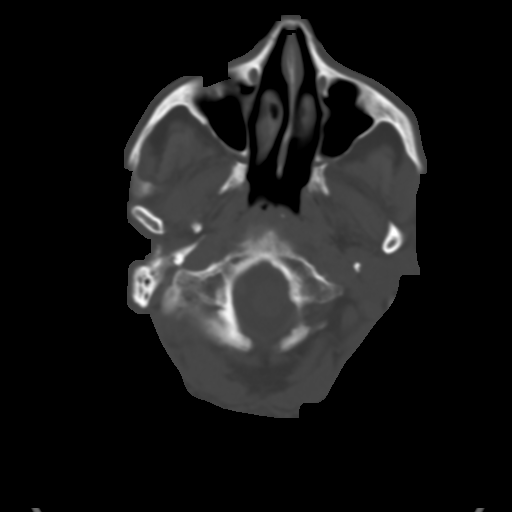
[im 4/30  brain]
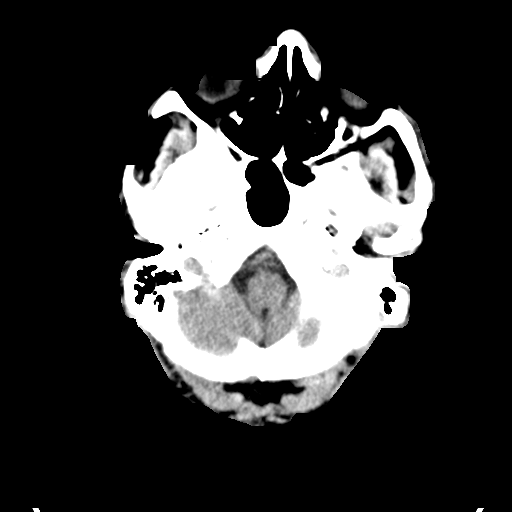
[im 6/30  brain]
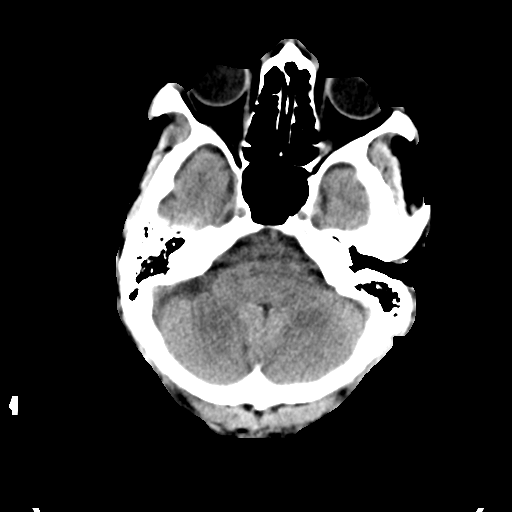
[im 8/30  brain]
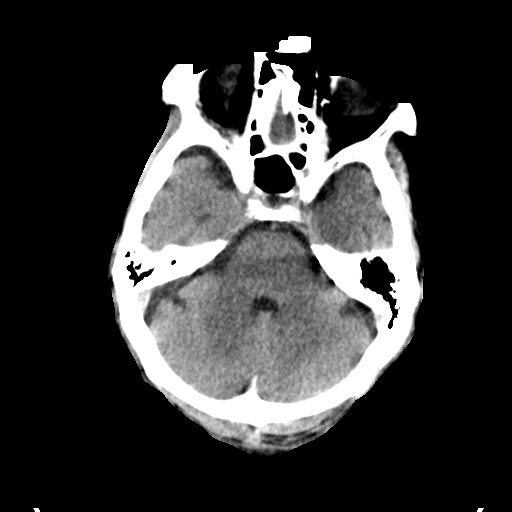
[im 9/30  brain]
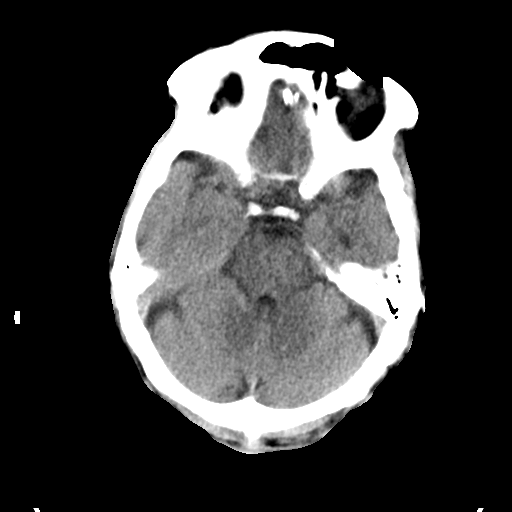
[im 9/30  bone]
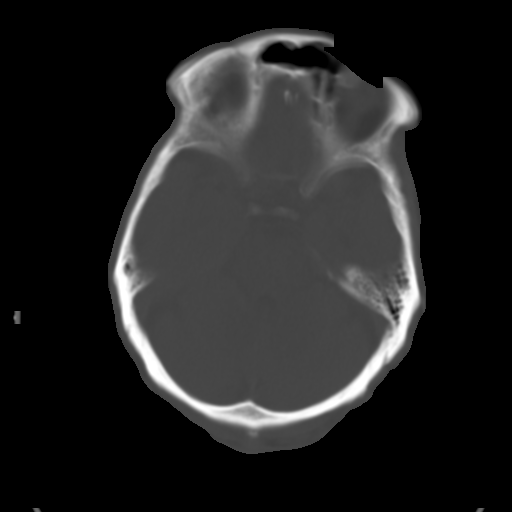
[im 11/30  brain]
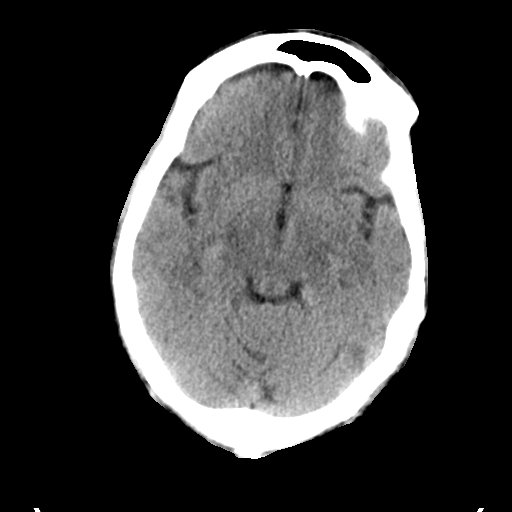
[im 13/30  brain]
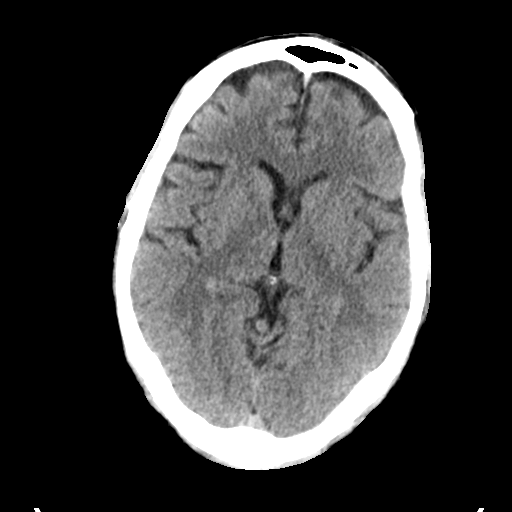
[im 15/30  brain]
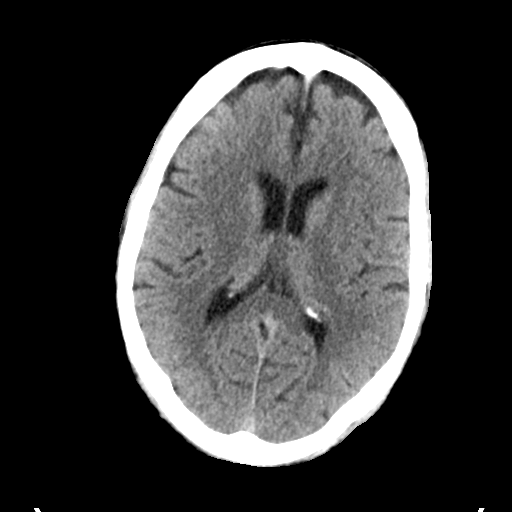
[im 16/30  brain]
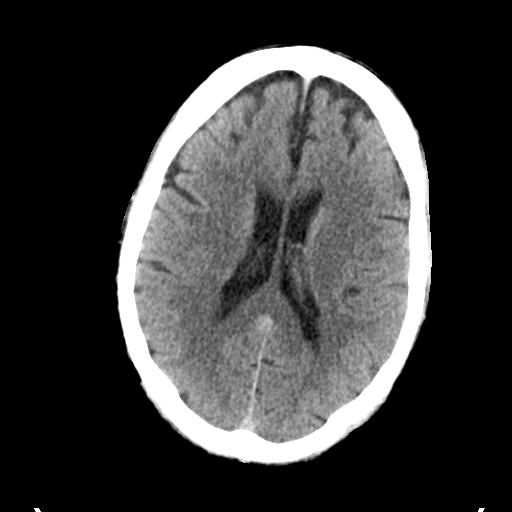
[im 16/30  bone]
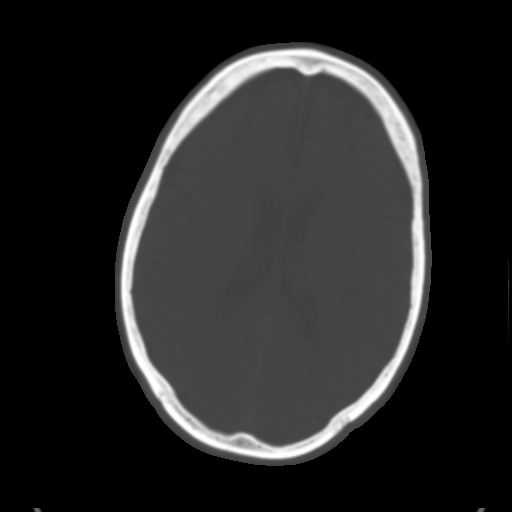
[im 18/30  brain]
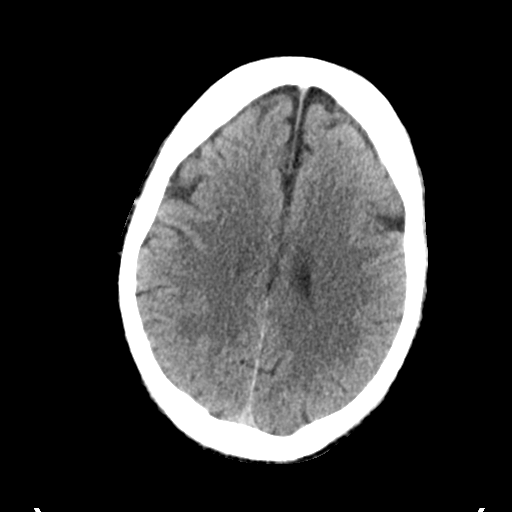
[im 20/30  brain]
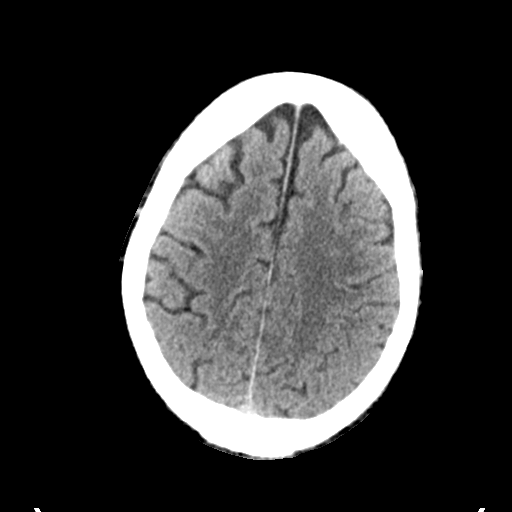
[im 22/30  brain]
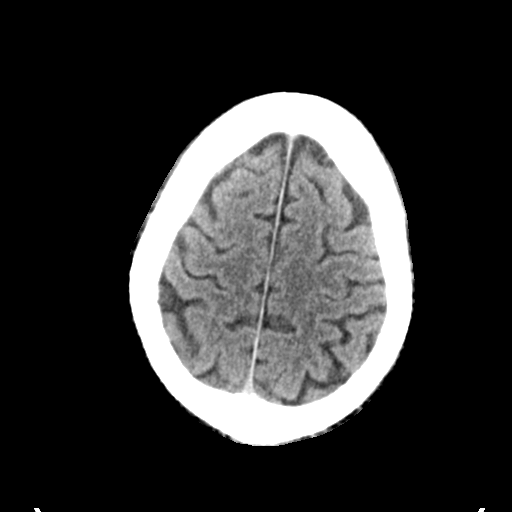
[im 23/30  brain]
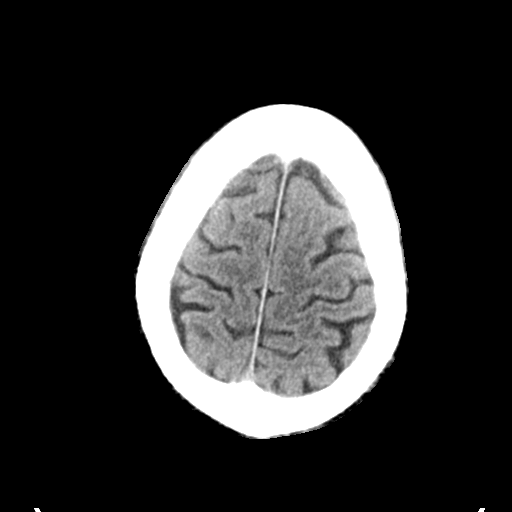
[im 23/30  bone]
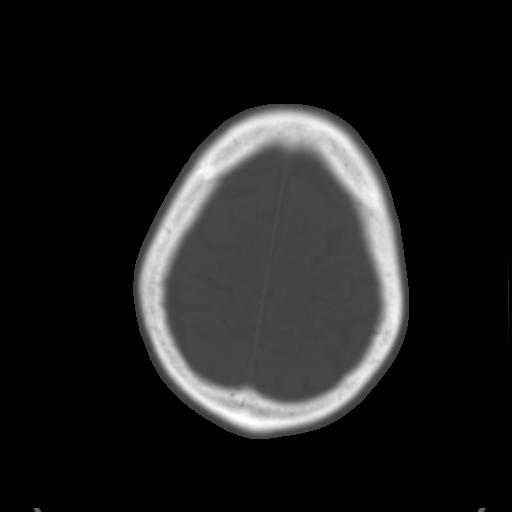
[im 25/30  brain]
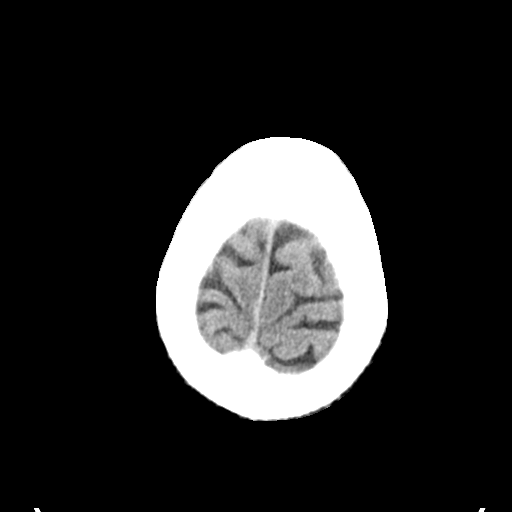
[im 27/30  brain]
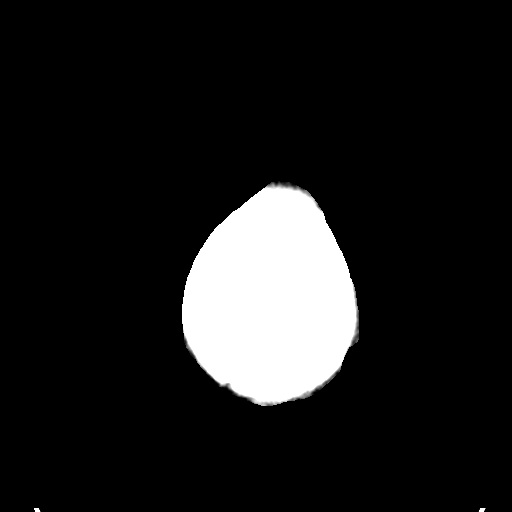
[im 29/30  brain]
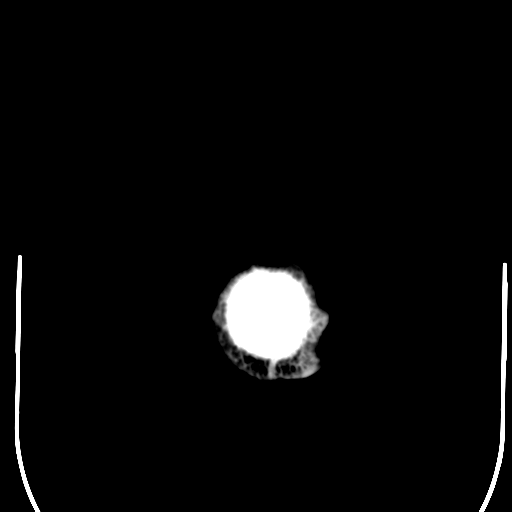

[16 of 30 positions shown; findings below may reference images not displayed]

FINDINGS: No evidence for acute infarction, hemorrhage, mass lesion,
hydrocephalus, or extra-axial fluid. Normal for age cerebral volume.
No white matter disease. Calvarium intact. No sinus or mastoid
fluid.
IMPRESSION: Negative exam.  No change from priors.

Critical Value/emergent results were called by telephone at the time
of interpretation on 10/11/2014 at [DATE] to Dr. YUAN , who
verbally acknowledged these results.

## 2016-01-03 IMAGING — CR DG CHEST 1V PORT
1 series · 1 of 1 positions shown · non-contrast
Comparison: 10/11/2014 and 01/17/2014

CLINICAL DATA: Shortness of breath.

EXAM:
PORTABLE CHEST - 1 VIEW

[AP]
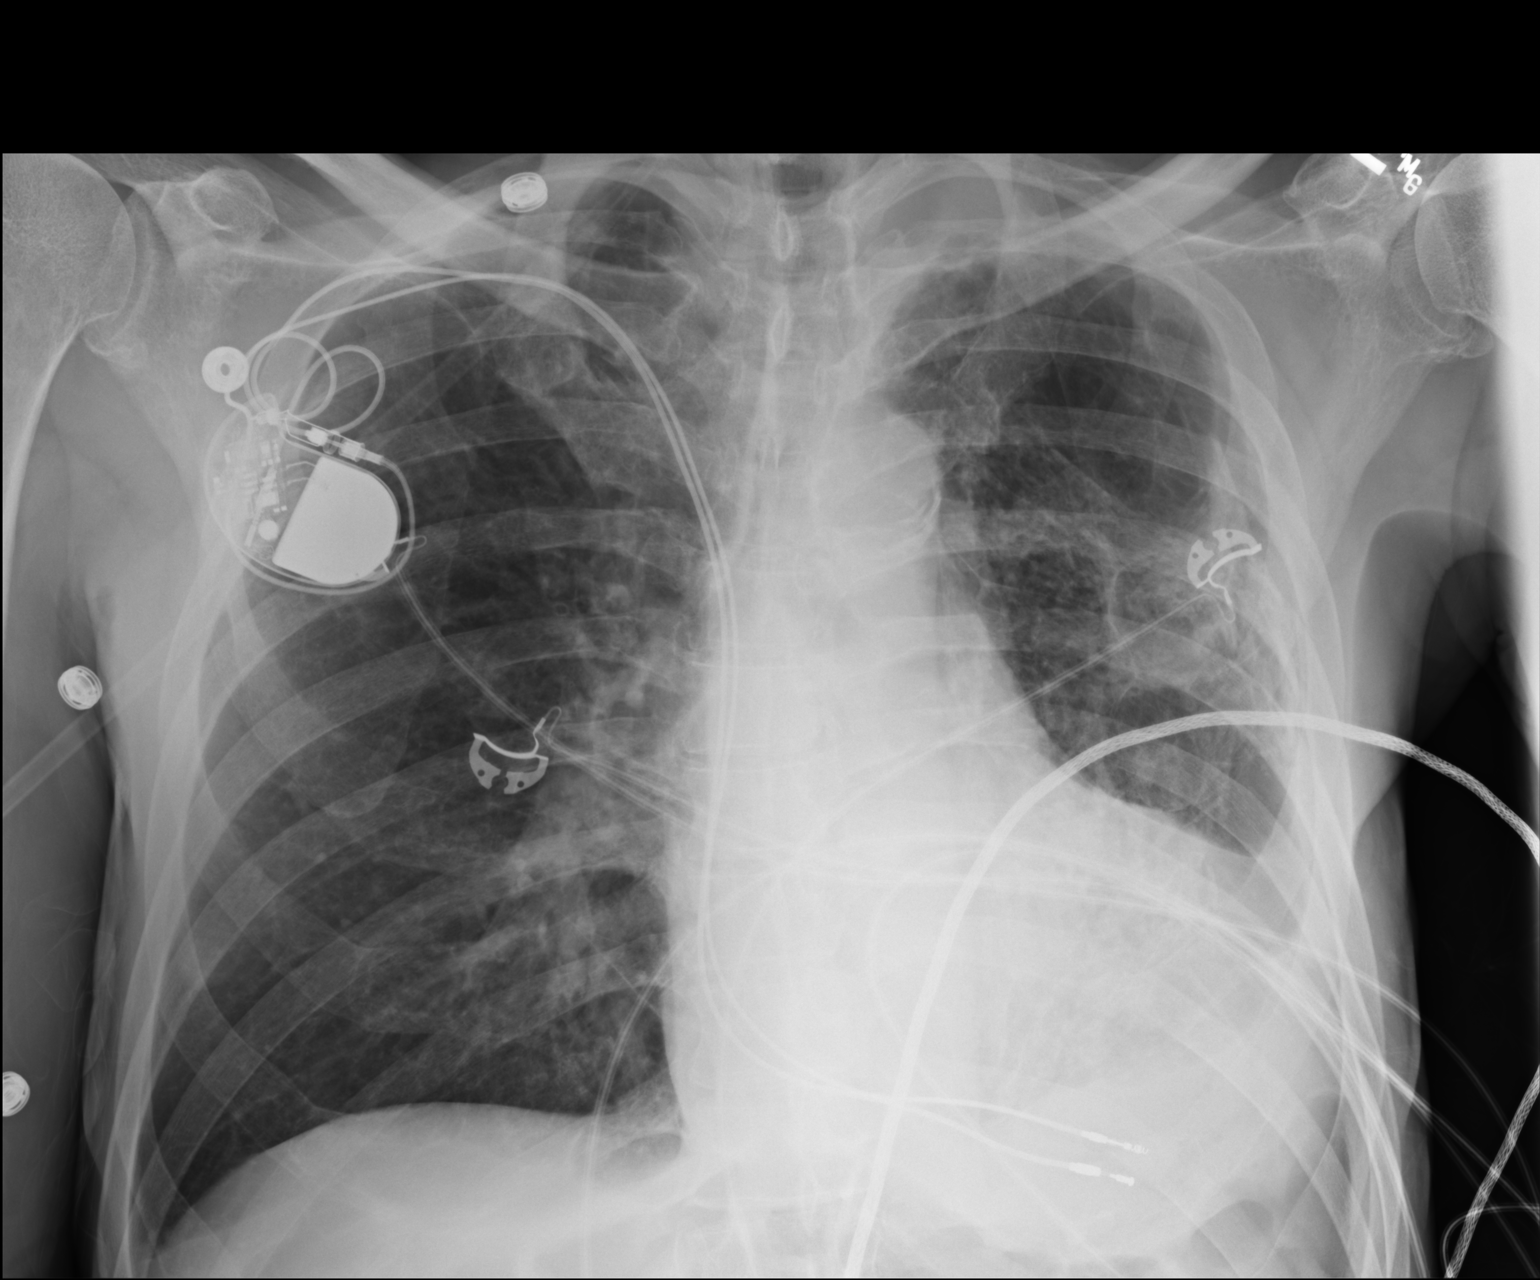

[1 of 1 positions shown; findings below may reference images not displayed]

FINDINGS: Right-sided pacemaker unchanged. Lungs are adequately inflated with
stable chronic elevation of the left hemidiaphragm and stable
chronic changes over the left lung. Known calcified pleural plaques.
No focal lobar consolidation or effusion cardiomediastinal
silhouette and remainder of the exam is unchanged.
IMPRESSION: No active disease.

Chronic stable changes of the left lung. Evidence of asbestos
related pleural disease.

## 2016-01-04 IMAGING — CR DG ANG/EXT/UNI/OR LEFT
1 series · 1 of 1 positions shown · non-contrast
Comparison: None.

CLINICAL DATA: Femoral thrombectomy for embolic occlusion in the
left lower extremity.

EXAM:
JOHNLESTER AGERO/EXT/UNI/ OR

[AP]
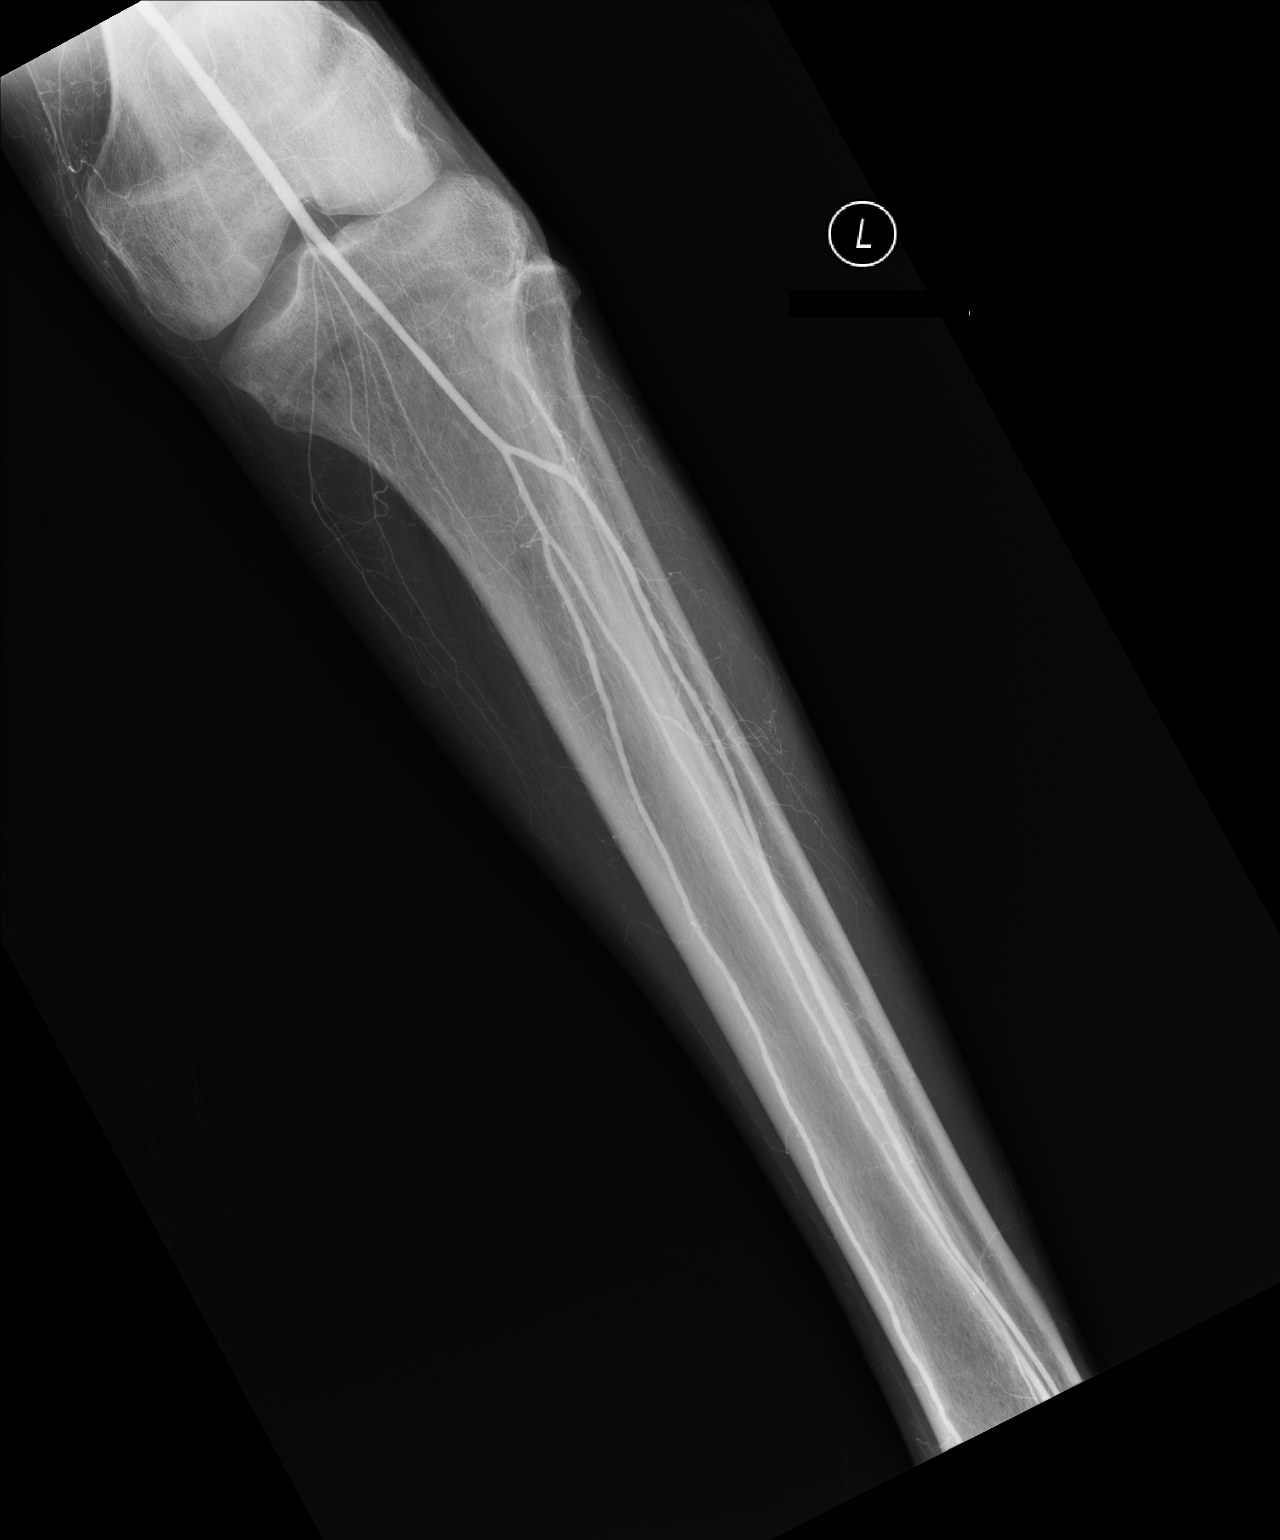

[1 of 1 positions shown; findings below may reference images not displayed]

FINDINGS: Intraoperative image shows a normally patent popliteal artery and
normally patent visualized anterior tibial, peroneal and posterior
tibial arteries to nearly the level of the ankle. No filling defects
are identified.
IMPRESSION: No distal occlusions or filling defects identified at the level of
the popliteal and tibial arteries.

## 2016-01-05 IMAGING — CT CT ABD-PELV W/ CM
2 of 5 series · 13 of 36 positions shown, 16 images · IV contrast (Omni 300)
Comparison: 06/02/2013 and prior chest CTs

CLINICAL DATA: 68-year-old male with unexplained weight loss.
Patient with myocardial infarction, CVA, multiple arch O emboli.
Acute on chronic kidney disease.

EXAM:
CT CHEST, ABDOMEN, AND PELVIS WITH CONTRAST
TECHNIQUE: Multidetector CT imaging of the chest, abdomen and pelvis was
performed following the standard protocol during bolus
administration of intravenous contrast.
CONTRAST:  80mL OMNIPAQUE IOHEXOL 300 MG/ML  SOLN

[Series 2: cap 5.0 i31f 1 · axial · 0.81mm/px · z∈[+846,+1466]mm · 10 of 144 slices shown, 13 images]
[im 10/144  mediastinal]
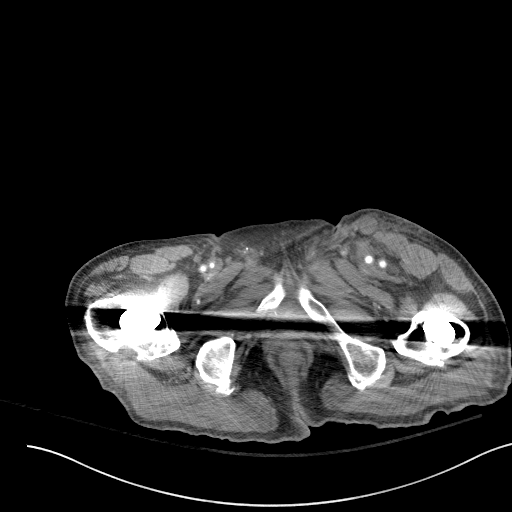
[im 10/144  lung]
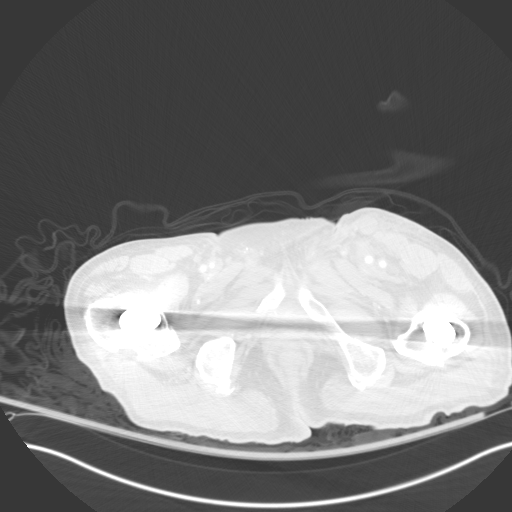
[im 29/144  lung]
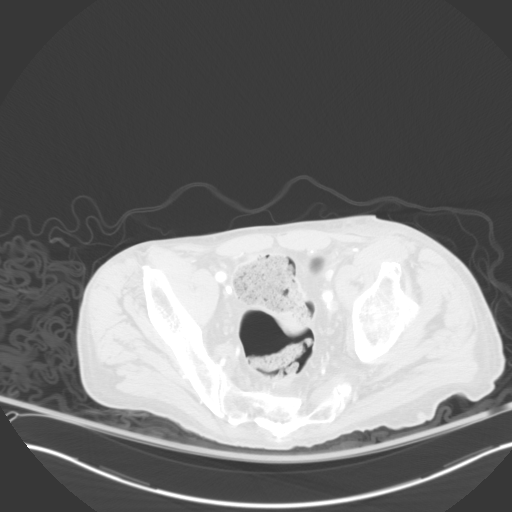
[im 48/144  lung]
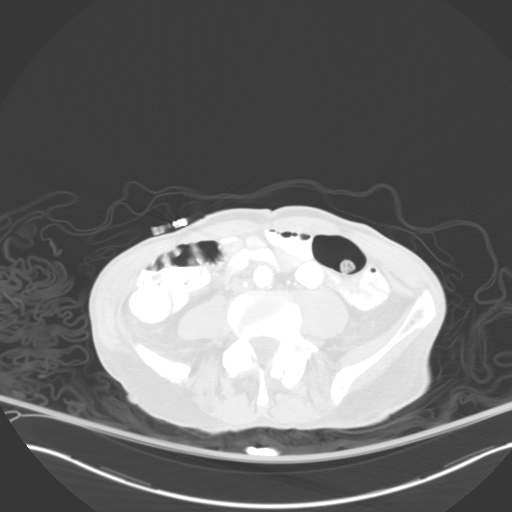
[im 58/144  lung]
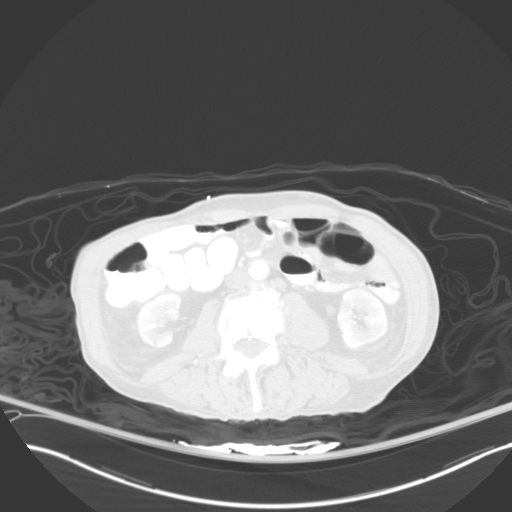
[im 67/144  mediastinal]
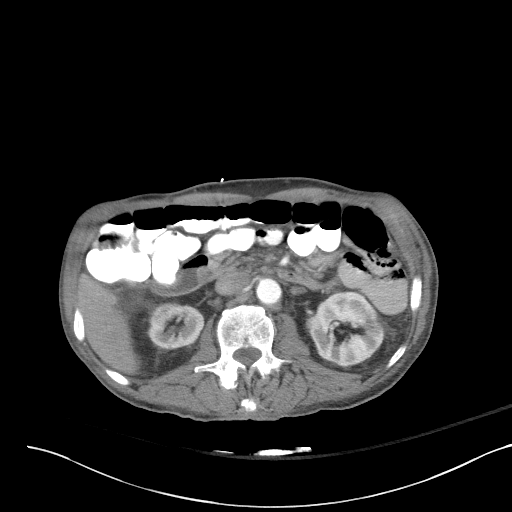
[im 67/144  lung]
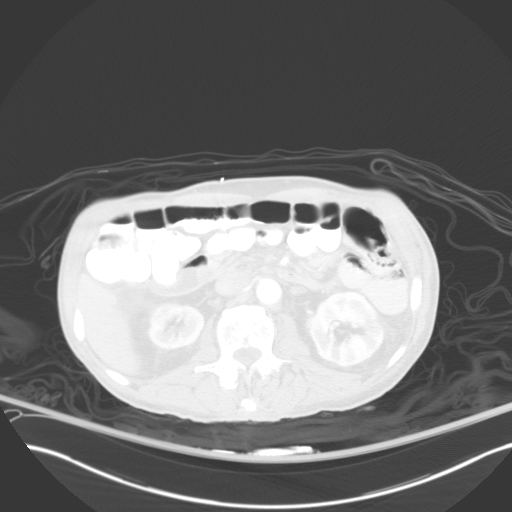
[im 86/144  lung]
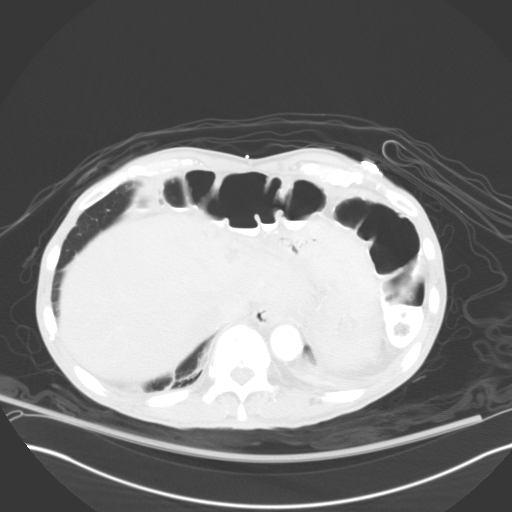
[im 96/144  lung]
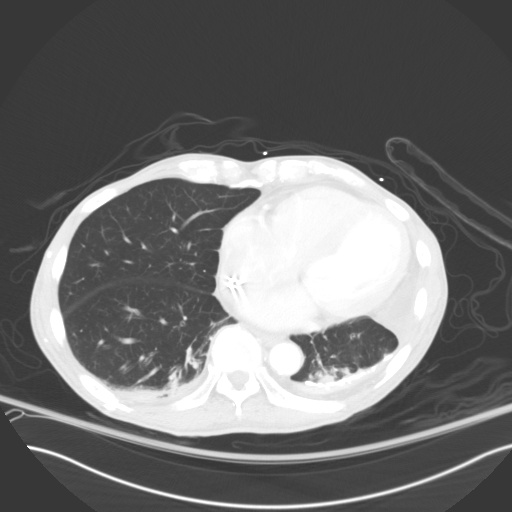
[im 105/144  lung]
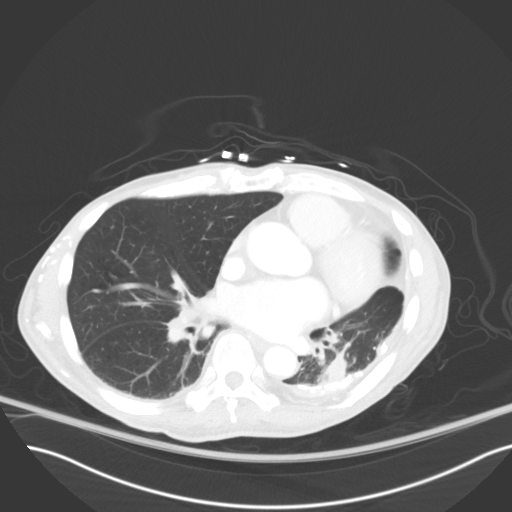
[im 124/144  mediastinal]
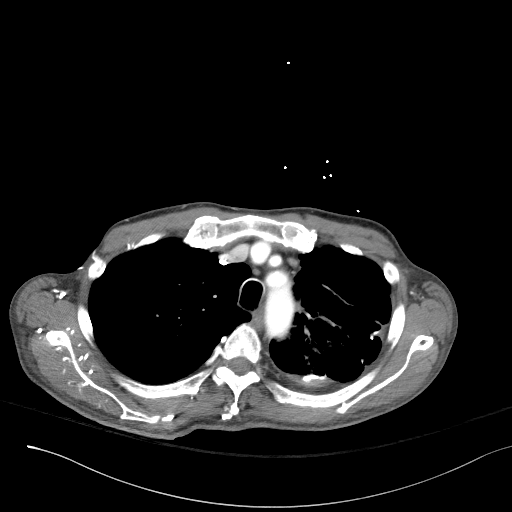
[im 124/144  lung]
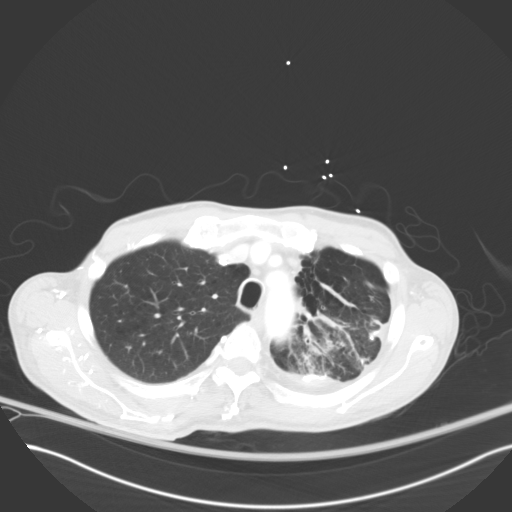
[im 134/144  lung]
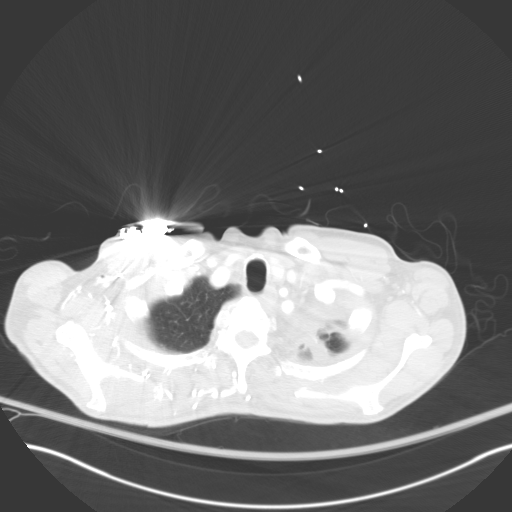

[Series 5: coronal · coronal · 0.77mm/px · 3 of 86 slices shown]
[im 18/86  lung]
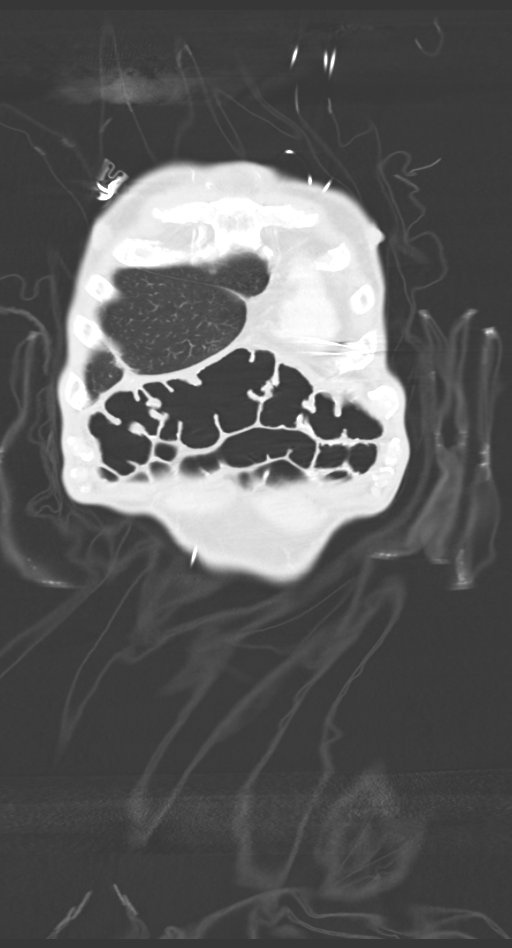
[im 35/86  lung]
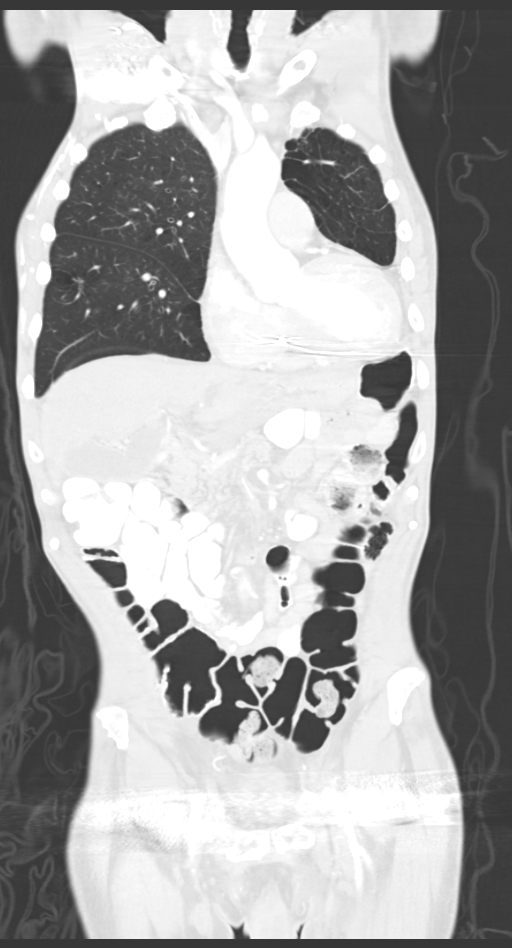
[im 52/86  lung]
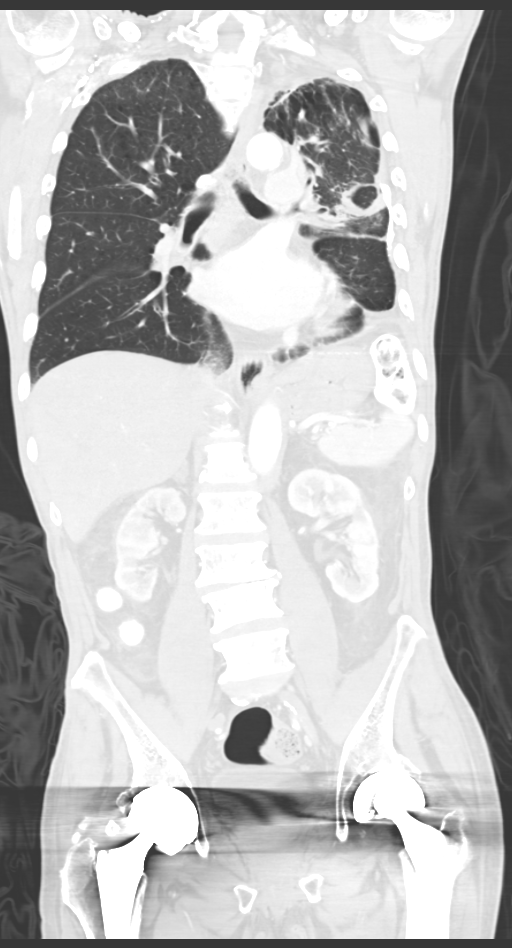

[13 of 36 positions shown; findings below may reference images not displayed]

FINDINGS: CT CHEST FINDINGS

Mediastinum/Nodes: Cardiomegaly again noted. Moderate coronary
artery calcifications are identified. The ascending aorta measures
3.7 cm and greatest diameter. No enlarged lymph nodes are
identified. There is no evidence of pericardial effusion.

Lungs/Pleura: Dependent and bibasilar atelectasis/ scarring again
noted. Scarring within the mid and lower left lung again identified.
Patchy ground-glass opacities within the left upper lobe may
represent aspiration or for pneumonia. There is no evidence of
suspicious mass or nodule. COPD/emphysema again identified. Left
posterior pleural calcifications are again identified.

Musculoskeletal: No acute or suspicious abnormalities.

CT ABDOMEN AND PELVIS FINDINGS

Hepatobiliary: The liver and gallbladder are unremarkable. There is
no evidence of biliary dilatation.

Pancreas: Unremarkable

Spleen: Unremarkable

Adrenals/Urinary Tract: Moderate bilateral renal atrophy and
cortical thinning noted, right greater than left. There is no
evidence of hydronephrosis, renal mass or definite renal calculi.

Stomach/Bowel: There is no evidence of bowel obstruction. No
definite bowel wall thickening noted. No dilated bowel loops are
present. Hip replacement artifact obscures detail within the pelvis.

Vascular/Lymphatic: No enlarged lymph nodes or abdominal aortic
aneurysm identified. Calcifications within the IVC, iliac and
femoral veins noted.

Reproductive: Prostate not well visualized.

Other: No free fluid, abscess or pneumoperitoneum.

Musculoskeletal: Mild stranding in the left inguinal region is
likely postoperative. Bilateral hip replacements identified. No
acute bony abnormalities are identified. Degenerative changes in the
lumbar spine and bilateral L5 pars defects again noted.
IMPRESSION: No evidence of primary malignancy.

Patchy left upper lobe ground-glass opacities - possible aspiration
versus pneumonia.

Cardiomegaly, coronary artery disease, ectatic ascending thoracic
aorta, COPD/emphysema and moderate bilateral renal atrophy.

## 2016-01-08 ENCOUNTER — Other Ambulatory Visit: Payer: Self-pay | Admitting: Internal Medicine

## 2016-01-08 ENCOUNTER — Encounter: Payer: Self-pay | Admitting: *Deleted

## 2016-01-08 ENCOUNTER — Other Ambulatory Visit: Payer: Self-pay | Admitting: *Deleted

## 2016-01-08 NOTE — Telephone Encounter (Signed)
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 Informed patient that we had 1 RX. He is "sending someone to get it." Patient to call and let us know who will be picking up.

## 2016-01-08 NOTE — Telephone Encounter (Signed)
Patient requesting pain med refill

## 2016-01-08 NOTE — Patient Outreach (Signed)
[facility]) Care Management  01/08/2016  ASUNCION GUIDER [birth year] [id number]  Third attempt to reach pt for ongoing transition of care.  RN able to leave a HIPAA approved voice message requesting a call back. Will send outreach letter and await 10 days for pt to  respond prior to case closure.  [name], RN Care Management Coordinator [place] Office [phone]

## 2016-01-09 ENCOUNTER — Other Ambulatory Visit: Payer: Self-pay | Admitting: *Deleted

## 2016-01-09 DIAGNOSIS — J129 Viral pneumonia, unspecified: Secondary | ICD-10-CM | POA: Diagnosis not present

## 2016-01-09 DIAGNOSIS — J441 Chronic obstructive pulmonary disease with (acute) exacerbation: Secondary | ICD-10-CM | POA: Diagnosis not present

## 2016-01-09 DIAGNOSIS — R2681 Unsteadiness on feet: Secondary | ICD-10-CM | POA: Diagnosis not present

## 2016-01-09 DIAGNOSIS — I4891 Unspecified atrial fibrillation: Secondary | ICD-10-CM | POA: Diagnosis not present

## 2016-01-09 DIAGNOSIS — J449 Chronic obstructive pulmonary disease, unspecified: Secondary | ICD-10-CM | POA: Diagnosis not present

## 2016-01-09 DIAGNOSIS — N19 Unspecified kidney failure: Secondary | ICD-10-CM | POA: Diagnosis not present

## 2016-01-09 DIAGNOSIS — M6281 Muscle weakness (generalized): Secondary | ICD-10-CM | POA: Diagnosis not present

## 2016-01-09 DIAGNOSIS — I5022 Chronic systolic (congestive) heart failure: Secondary | ICD-10-CM | POA: Diagnosis not present

## 2016-01-09 NOTE — Patient Outreach (Signed)
[facility]) Care Management  01/09/2016  KEELAND TALKINGTON [birth year] [id number]   CSW made a second attempt to try and contact patient today to perform phone assessment, as well as assess and assist with social work needs and services, without success.  A HIPAA compliant message was left on voicemail.  CSW is currently awaiting a return call.  CSW will make a third attempt within the next week. [name], BSW, MSW, LCSW  Licensed Education officer, environmental Health System  Mailing [place] [address] [place], [place] [address] Physical Address-[address], [place], [place] [address] Toll Free Main # [phone] Fax # [phone] Cell # [phone]  Fax # [phone]  [name]

## 2016-01-11 ENCOUNTER — Other Ambulatory Visit: Payer: Self-pay | Admitting: *Deleted

## 2016-01-11 NOTE — Patient Outreach (Addendum)
[facility]) Care Management  01/11/2016  JAFFAR RIEDMAN 1946-05-10 [id number]  RN spoke with pt today who indicates he is weighing with his home scales with today's weight at 151 lbs, yesterday 151 lbs and one week ago at 149 lbs with no swelling or issues with his breathing. Pt states he is attending all his medical appointments and taking all medications as prescribed with no problems. Reports he has obtained his MCD application and spoke with his caseworker on additional items he needs to send in to qualify however pt had questions concerning if he completed all sections appropriately. RN encouraged pt to review with family member or contact his caseworker with such details and inquire further on what is needed. Pt indicated he would follow up with his caseworker. RN inquired on any additional resources needed as pt indicated he did not need any additional resources at this time. RN review pt's plan of care and goals previously set concerning his COPD/HF as pt continues to progress in meeting those goals. Will re-evaluate next week with plans to completed the Transition of care contacts. Based upon pt's decline for home visits with his ongoing progress RN may discharge pt at that time if he continues to progress with successfully meeting his goals in managing his ongoing care.  RN also contacted the involved social worker via [facility] with update concerning the above information.   [name], RN Care Management Coordinator [place] Office [phone]

## 2016-01-11 NOTE — Patient Outreach (Signed)
[facility]) Care Management  01/10/2016  [name] 04/18/1946 [id number]   RN received a voice message from pt returning the previous calls for ongoing transition of care however RN was not available at the time of the call. RN will attempt once again to connect with this pt however an outreach letter has been sent to this pt this week.   [name], RN Care Management Coordinator [place] Office [phone]

## 2016-01-12 ENCOUNTER — Other Ambulatory Visit: Payer: Self-pay | Admitting: Pulmonary Disease

## 2016-01-12 DIAGNOSIS — R0600 Dyspnea, unspecified: Secondary | ICD-10-CM

## 2016-01-14 DIAGNOSIS — J449 Chronic obstructive pulmonary disease, unspecified: Secondary | ICD-10-CM | POA: Diagnosis not present

## 2016-01-14 DIAGNOSIS — R2681 Unsteadiness on feet: Secondary | ICD-10-CM | POA: Diagnosis not present

## 2016-01-14 DIAGNOSIS — M6281 Muscle weakness (generalized): Secondary | ICD-10-CM | POA: Diagnosis not present

## 2016-01-14 DIAGNOSIS — J129 Viral pneumonia, unspecified: Secondary | ICD-10-CM | POA: Diagnosis not present

## 2016-01-14 DIAGNOSIS — I5022 Chronic systolic (congestive) heart failure: Secondary | ICD-10-CM | POA: Diagnosis not present

## 2016-01-14 DIAGNOSIS — J441 Chronic obstructive pulmonary disease with (acute) exacerbation: Secondary | ICD-10-CM | POA: Diagnosis not present

## 2016-01-14 DIAGNOSIS — N19 Unspecified kidney failure: Secondary | ICD-10-CM | POA: Diagnosis not present

## 2016-01-14 DIAGNOSIS — I4891 Unspecified atrial fibrillation: Secondary | ICD-10-CM | POA: Diagnosis not present

## 2016-01-15 ENCOUNTER — Ambulatory Visit (INDEPENDENT_AMBULATORY_CARE_PROVIDER_SITE_OTHER): Payer: Commercial Managed Care - HMO | Admitting: Pulmonary Disease

## 2016-01-15 ENCOUNTER — Ambulatory Visit: Payer: Self-pay | Admitting: *Deleted

## 2016-01-15 ENCOUNTER — Encounter: Payer: Self-pay | Admitting: Pulmonary Disease

## 2016-01-15 ENCOUNTER — Other Ambulatory Visit: Payer: Self-pay | Admitting: *Deleted

## 2016-01-15 VITALS — BP 106/60 | HR 60 | Ht 75.0 in | Wt 148.0 lb

## 2016-01-15 DIAGNOSIS — J449 Chronic obstructive pulmonary disease, unspecified: Secondary | ICD-10-CM | POA: Diagnosis not present

## 2016-01-15 DIAGNOSIS — J948 Other specified pleural conditions: Secondary | ICD-10-CM | POA: Diagnosis not present

## 2016-01-15 DIAGNOSIS — J90 Pleural effusion, not elsewhere classified: Secondary | ICD-10-CM

## 2016-01-15 NOTE — Progress Notes (Signed)
[name]    [id number]    [birth year]  Primary Care Physician:Vishal [name], MD  Referring Physician: Zada Finders, MD [address] [place], [place] [address]  Chief complaint:  Follow up for asbestosis, pleural effusion  HPI: [name] is a 70 y.o. 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 Former smoker who quit 05/2015.  Pt was followed up in office after a hospitalization from 09/27/15 to 10/03/15  for recurrent pneumonia and pleural effusions. He underwent a thoracentesis that showed a transudative effusion, negative for malignancy. In office today he feels well. He has some dyspnea on exertion at rest but this is at baseline. He denies any cough, sputum production, fevers, chills   Outpatient Encounter Prescriptions as of 01/15/2016  Medication Sig  . albuterol (VENTOLIN HFA) 108 (90 BASE) MCG/ACT inhaler Inhale 2 puffs into the lungs every 6 (six) hours as needed for wheezing or shortness of breath.  [name] apixaban (ELIQUIS) 5 MG TABS tablet Take 1 tablet (5 mg total) by mouth 2 (two) times daily.  [name] aspirin 81 MG chewable tablet Chew 1 tablet (81 mg total) by mouth daily.  [name] buPROPion (WELLBUTRIN) 75 MG tablet Take 1 tablet (75 mg total) by mouth 2 (two) times daily.  . cholecalciferol (VITAMIN D) 1000 units tablet Take 1 tablet (1,000 Units total) by mouth daily.  . ciprofloxacin (CIPRO) 250 MG tablet Take 1 tablet (250 mg total) by mouth 2 (two) times daily.  [name] diltiazem (CARDIZEM CD) 120 MG 24 hr capsule Take 1 capsule (120 mg total) by mouth daily.  [name] dronabinol (MARINOL) 2.5 MG capsule Take 1 capsule (2.5 mg total) by mouth 2 (two) times daily before lunch and supper.  . furosemide (LASIX) 40 MG tablet Take 1 tablet (40 mg total) by mouth daily.  . metoprolol succinate (TOPROL-XL) 100 MG 24 hr tablet Take 100 mg by mouth daily. Take with or immediately following a  meal.  . mirtazapine (REMERON) 30 MG tablet Take 1 tablet (30 mg total) by mouth at bedtime.  . Multiple Vitamin (MULTIVITAMIN WITH MINERALS) TABS tablet Take 1 tablet by mouth daily. Reported on 10/11/2015  . omeprazole (PRILOSEC) 40 MG capsule Take 1 capsule (40 mg total) by mouth daily.  [name] oxyCODONE-acetaminophen (PERCOCET) 10-325 MG tablet Take 1 tablet by mouth every 4 (four) hours as needed for pain. Do not take more than 5 tablets in 24 hours.  . rosuvastatin (CRESTOR) 20 MG tablet Take 1 tablet (20 mg total) by mouth daily at 6 PM.  . senna-docusate ([employer] SENNA PLUS) 8.6-50 MG tablet Take 1 tablet by mouth daily as needed for moderate constipation.  [name] tiotropium (SPIRIVA) 18 MCG inhalation capsule Place 1 capsule (18 mcg total) into inhaler and inhale daily.   No facility-administered encounter medications on file as of 01/15/2016.     Allergies as of 01/15/2016 - Review Complete 01/15/2016  Allergen Reaction Noted  . Amiodarone Other (See Comments) 07/30/2015  . Penicillins Rash     Past Medical History:  Diagnosis Date  . Alcohol abuse   . Anxiety   . Avascular necrosis of bones of both hips (HCC)    Status post bilateral hip replacements  . Bipolar disorder ([place])   . CHF (congestive heart failure) ([place])   . Chronic systolic heart failure ([place])    a. NICM - EF 35% with normal  cors 2003. b. Last echo 11/2012 - EF 25-30%.  . CKD (chronic kidney disease)    Renal U/S 12/04/2009 showed no pathological findings. Labs 12/04/2009 include normal ESR, C3, C4; neg ANA; SPEP showed nonspecific increase in the alpha-2 region with no M-spike; UPEP showed no monoclonal free light chains; urine IFE showed polyclonal increase in feree Kappa and/or free Lambda light chains. Baseline Cr reported 1.7-2.5.  [name] COPD (chronic obstructive pulmonary disease) ([place])   . Depression   . DVT (deep venous thrombosis) ([place])    He has hypercoagulability with multiple prior DVTs  . E coli bacteremia   .  Erectile dysfunction   . Gastritis   . GERD (gastroesophageal reflux disease)   . HCAP (healthcare-associated pneumonia) 08/24/2015  . Hydrocele, unspecified   . Hyperlipemia   . Hypertension   . 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  Thyroid scan 08/28/2009 was normal with no focal areas of abnormal increased or decreased activity seen; the uptake of I 131 sodium iodide at 24 hours was 5.7%.  TSH and free T4 normalized by 08/16/2009.  . Intestinal obstruction ([place])   . Lung nodule    Chest CT scan on 12/14/2008 showed a nodular opacity at the left lung base felt to most likely represent scarring.  Follow-up chest CT scan on 06/02/2010 showed parenchymal scarring in the left apex, left  lower lobe, and lingula; some of this scarring at the left base had a nodular appearance, unchanged. Chest 09/2012 - stable.  . Noncompliance   . On home oxygen therapy    "3L periodically" (08/24/2015)  . Osteoarthritis   . Pleural plaque    H/o asbestos exposure. Chest CT on 06/02/2010 showed stable extensive calcified pleural plaques involving the left hemithorax, consistent with asbestos related pleural disease.  . Portal vein thrombosis   . Spondylosis   . STEMI (ST elevation myocardial infarction) ([name]) 10/11/2014  . 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 b. St. Jude gen change 2010.  [name] Thrombocytopenia ([place])   . 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  . Weight loss    Normal colonoscopy by Dr. Olevia Perches on 11/06/2010.    Past Surgical History:  Procedure Laterality Date  . CARDIAC CATHETERIZATION  2003   Nl cors, EF 35%  . CARDIOVERSION N/A 05/27/2013   Procedure: CARDIOVERSION;  Surgeon: Candee Furbish, MD;  [facility] END[facility]COPY;  Service: Cardiovascular;  Laterality: N/A;  . EMBOLECTOMY Right 12/10/2013   Procedure:  Right Femoral Embolectomy; Fasciotomy Right Lower Leg with Intraoperative arteriogram;  Surgeon: Rosetta Posner, MD;  Location: [place];  Service: Vascular;  Laterality: Right;  . EMBOLECTOMY Left 10/12/2014   Procedure: Left Femoral Thrombectomy with intraoperative arteriogram;  Surgeon: Rosetta Posner, MD;  Location: [place];  Service: Vascular;  Laterality: Left;  . FASCIOTOMY CLOSURE Right 12/15/2013   Procedure: LEG FASCIOTOMY CLOSURE;  Surgeon: Rosetta Posner, MD;  Location: [place];  Service: Vascular;  Laterality: Right;  . HEMIARTHROPLASTY HIP Left 09/09/2002   bipolar; Dr. Lafayette Dragon  . INSERT / REPLACE / REMOVE PACEMAKER  02/2003   Dr. Roe Rutherford, St. Jude Medical pulse generator identity SR model [id number] serial [phone] [id number]; gen change by Greggory Brandy  . INSERT / REPLACE / REMOVE PACEMAKER  06/17/2008   Lead and generator change w/ St. Jude Medical Tendril ST model [phone] (serial  #  [id number]).       . INTRAOPERATIVE ARTERIOGRAM Right 12/10/2013   Procedure: INTRA OPERATIVE ARTERIOGRAM;  Surgeon: Rosetta Posner, MD;  Location: [place];  Service: Vascular;  Laterality: Right;  . JOINT REPLACEMENT    . TEE WITHOUT CARDIOVERSION N/A 05/27/2013   Procedure: TRANSESOPHAGEAL ECHOCARDIOGRAM (TEE);  Surgeon: Candee Furbish, MD;  Location[facility]COPY;  Service: Cardiovascular;  Laterality: N/A;  . TOTAL HIP ARTHROPLASTY Right 03/08/2008    By Dr. Hiram Comber    Family History  Problem Relation Age of Onset  . Hypertension Mother   . Asthma Mother   . Coronary artery disease Mother   . Cancer Mother   . Cancer Brother     Unsure of type.  . Stroke      mother  . Heart attack      brother and sister ?age   . Heart disease Brother   . Colon cancer Neg Hx   . Lung cancer Neg Hx   . Prostate cancer Neg Hx     Social History   Social History  . Marital status: Widowed    Spouse name: N/A  . Number of children: 10  . Years of education: N/A   Occupational History  . RETIRED Retired   Social History Main  Topics  . Smoking status: Former Smoker    Packs/day: 0.33    Years: 54.00    Types: Cigarettes    Quit date: 05/21/2015  . Smokeless tobacco: Never Used  . Alcohol use No     Comment: 6/22//2016 "h/o etoh abuse; stopped drinking ~ 2012"  . Drug use: No     Comment: 11/09/2014 "none since 10/11/2014; did smoke marijuana ~ 1 time/month"  . Sexual activity: Not Currently   Other Topics Concern  . Not on file   Social History Narrative   Retired Architect W41 yrs here in [place]   Smoker 1 pack last 1.5 days tobacco, smokes marijuana    Denies EtOH x 4 years (on 10/12/12)   9 kids    From [place]   [place] Veteran    12 grade education               Review of systems: Review of Systems  Constitutional: Negative for fever and chills.  HENT: Negative.   Eyes: Negative for blurred vision.  Respiratory: as per HPI  Cardiovascular: Negative for chest pain and palpitations.  Gastrointestinal: Negative for vomiting, diarrhea, blood per rectum. Genitourinary: Negative for dysuria, urgency, frequency and hematuria.  Musculoskeletal: Negative for myalgias, back pain and joint pain.  Skin: Negative for itching and rash.  Neurological: Negative for dizziness, tremors, focal weakness, seizures and loss of consciousness.  Endo/Heme/Allergies: Negative for environmental allergies.  Psychiatric/Behavioral: Negative for depression, suicidal ideas and hallucinations.  All other systems reviewed and are negative.   Physical Exam: Blood pressure 106/60, pulse 60, height '6\' 3"'  (1.905 m), weight 148 lb (67.1 kg), SpO2 100 %. Gen:      No acute distress HEENT:  EOMI, sclera anicteric Neck:     No masses; no thyromegaly Lungs:    Clear to auscultation bilaterally; normal respiratory effort CV:         Regular rate and rhythm; no murmurs Abd:      + bowel sounds; soft, non-tender; no palpable masses, no distension Ext:    No edema; adequate peripheral perfusion Skin:      Warm and dry; no  rash Neuro: alert and oriented x 3  Psych: normal mood and affect  Data Reviewed: 08/14/15 ECHO >> LV moderately dilated, EF 30%, diffuse hypokinesis, mild AR, mod MR, mod TR 5/10 CT Chest >> asbestos-related pleural disease including extensive calcified pleural plaques in left greater than right pleural spaces, small bilateral pleural effusions, near complete right lower lobe consolidation, volume loss, thick transplant pulmonic bandlike opacity in the periphery of the left lung and nodular pleural-based focus of consolidation in the left lower lobe concerning for progressive sequela I of asbestos-related pleural disease, new mild subcarinal and bilateral hilar adenopathy, mild cardiomegaly. 09/29/15> Thoracentesis : transudative  PET scan 3/54/65 No hypermetabolic mass or adenopathy, bilateral calcified pleural plaques.  PFTs 11/15/15 FVC 3.08 (63%) FEV1 2 (55%) F/F 65 TLC 68% DLCO 48% Moderate obstructive disease, positive bronchial dilator response, severe restriction and diffusion defect.  Assessment:  70 Y/O with complicated medical history, CHF, heavy smoker, pleural plaques, presumed asbestos exposure but patient does not recall. He has had mutiple admissions with recurrent resp failure.  He had a workup for malignancy including a negative thoracentesis and a PET scan. It looks like his CT abnormalities is secondary to asbestos even though he does not recall any such exposure. PFTs show restriction and TLC impairment consistent with asbestosis. He also has some reversible airway obstruction consistent with COPD and will continue on the Spiriva  Plan/Recommendations: - Continue spiriva - Continue monitoring asbestosis.  Marshell Garfinkel MD [facility] Pager [phone] If no answer or after 3pm call: [phone] 01/15/2016, 5:04 PM  CC: Zada Finders, MD

## 2016-01-15 NOTE — Patient Instructions (Signed)
The CT scan and PFTs were reviewed with you today. There is no evidence of malignancy on the recent PET scan. We'll continue to follow your lung scarring and fibrosis.  Return to clinic in 6 months.

## 2016-01-15 NOTE — Patient Outreach (Signed)
[facility]) Care Management  01/15/2016  [name] [birth year] [id number]   CSW received a voicemail message from [name], [facility] with [facility], indicating that social work needs are no longer indicated for patient.  [name] went on to say that all of patient's social work needs have been resolved and that patient has been instructed to follow-up with his Medicaid Case Worker at the [facility] regarding his application for Adult Medicaid, Physicist, medical and [employer].  [name] is still involved with patient's case and reports that she will notify CSW if additional social work needs are identified in the near future. CSW will perform a case closure on patient, as all goals of treatment have been met from social work standpoint and no additional social work needs have been identified at this time.  CSW will notify patient's RNCM with [facility], [name] of CSW's plans to close patient's case.  CSW will fax an update to patient's Primary Care Physician, Dr.  Zada Finders to ensure that they are aware of CSW's involvement with patient's plan of care.  CSW will submit a case closure request to Verlon Setting, Care Management Assistant with [facility], in the form of an [employer].  CSW will ensure that Mrs. Comer is aware of Imelda Pillow, RNCM with [facility], continued involvement with patient's care. [name], BSW, MSW, LCSW  Licensed Education officer, [facility]  Mailing [place] [address], [place], [place] [address] Physical Address-[address], [place], [place] [address] Toll Free Main # [phone] Fax # [phone] Cell # [phone]  Fax # [phone]  [name]

## 2016-01-16 ENCOUNTER — Other Ambulatory Visit: Payer: Self-pay | Admitting: *Deleted

## 2016-01-16 ENCOUNTER — Ambulatory Visit: Payer: Self-pay | Admitting: *Deleted

## 2016-01-16 MED ORDER — MIRTAZAPINE 30 MG PO TABS: 30.0000 mg | ORAL_TABLET | Freq: Every day | ORAL | 2 refills | Status: DC

## 2016-01-18 ENCOUNTER — Encounter: Payer: Self-pay | Admitting: *Deleted

## 2016-01-18 ENCOUNTER — Other Ambulatory Visit: Payer: Self-pay | Admitting: *Deleted

## 2016-01-18 NOTE — Patient Outreach (Signed)
[facility]) Care Management  01/18/2016  [name] 02/28/1946 [id number]   RN spoke with pt today who indicates he is doing "fine". Reports a weight today and yesterday at 149  Lbs and last week 149.2 lbs with no reported symptoms or issues.  RN verified pt remains in the GREEN zone with major incidence or issues.  Pt breathing good with no flare ups related to his COPD and continues to take all prescribed medications as prescribed. Pt also continues to tolerate his meals along with needed supplements of Ensure. Reviewed all goals as pt continues to due well. RN strongly encouraged pt to continue managing his HF and COPD with the printed material provided on an earlier home visit. Plan of care discussed with all goals met. Pt did have a hospitalization however it was not related to his HF or COPD. RN inquired on pt's MCD application as pt currently completed and awaiting family member to mail. Offered any other community resources or needs at this time with none listed. Based upon all goals met and pt managing his medical issues accordingly RN will discharge pt and close this case. Pt does not wish to have home visits for continuous education and teachings on his medical condition with home visits at this time. Dr. [name] will be notified.  [name], RN Care Management Coordinator [place] Office [phone]

## 2016-01-20 DIAGNOSIS — I4891 Unspecified atrial fibrillation: Secondary | ICD-10-CM | POA: Diagnosis not present

## 2016-01-20 DIAGNOSIS — J129 Viral pneumonia, unspecified: Secondary | ICD-10-CM | POA: Diagnosis not present

## 2016-01-20 DIAGNOSIS — J449 Chronic obstructive pulmonary disease, unspecified: Secondary | ICD-10-CM | POA: Diagnosis not present

## 2016-01-20 DIAGNOSIS — N19 Unspecified kidney failure: Secondary | ICD-10-CM | POA: Diagnosis not present

## 2016-02-02 ENCOUNTER — Emergency Department (HOSPITAL_COMMUNITY): Payer: Commercial Managed Care - HMO

## 2016-02-02 ENCOUNTER — Inpatient Hospital Stay (HOSPITAL_COMMUNITY)
Admission: EM | Admit: 2016-02-02 | Discharge: 2016-02-07 | DRG: 309 | Disposition: A | Discharge status: Home Health | Payer: Commercial Managed Care - HMO | Attending: Internal Medicine | Admitting: Internal Medicine

## 2016-02-02 ENCOUNTER — Encounter (HOSPITAL_COMMUNITY): Payer: Self-pay | Admitting: Emergency Medicine

## 2016-02-02 DIAGNOSIS — K529 Noninfective gastroenteritis and colitis, unspecified: Secondary | ICD-10-CM | POA: Diagnosis not present

## 2016-02-02 DIAGNOSIS — Z87440 Personal history of urinary (tract) infections: Secondary | ICD-10-CM

## 2016-02-02 DIAGNOSIS — R0602 Shortness of breath: Secondary | ICD-10-CM | POA: Diagnosis not present

## 2016-02-02 DIAGNOSIS — Z7982 Long term (current) use of aspirin: Secondary | ICD-10-CM

## 2016-02-02 DIAGNOSIS — I959 Hypotension, unspecified: Secondary | ICD-10-CM | POA: Diagnosis not present

## 2016-02-02 DIAGNOSIS — I10 Essential (primary) hypertension: Secondary | ICD-10-CM | POA: Diagnosis present

## 2016-02-02 DIAGNOSIS — Z86718 Personal history of other venous thrombosis and embolism: Secondary | ICD-10-CM | POA: Diagnosis not present

## 2016-02-02 DIAGNOSIS — R002 Palpitations: Secondary | ICD-10-CM | POA: Diagnosis present

## 2016-02-02 DIAGNOSIS — J438 Other emphysema: Secondary | ICD-10-CM

## 2016-02-02 DIAGNOSIS — I5042 Chronic combined systolic (congestive) and diastolic (congestive) heart failure: Secondary | ICD-10-CM | POA: Diagnosis not present

## 2016-02-02 DIAGNOSIS — R1031 Right lower quadrant pain: Secondary | ICD-10-CM | POA: Diagnosis not present

## 2016-02-02 DIAGNOSIS — I4819 Other persistent atrial fibrillation: Secondary | ICD-10-CM

## 2016-02-02 DIAGNOSIS — I4892 Unspecified atrial flutter: Secondary | ICD-10-CM | POA: Diagnosis not present

## 2016-02-02 DIAGNOSIS — I2510 Atherosclerotic heart disease of native coronary artery without angina pectoris: Secondary | ICD-10-CM | POA: Diagnosis present

## 2016-02-02 DIAGNOSIS — K5289 Other specified noninfective gastroenteritis and colitis: Secondary | ICD-10-CM | POA: Diagnosis not present

## 2016-02-02 DIAGNOSIS — N179 Acute kidney failure, unspecified: Secondary | ICD-10-CM | POA: Diagnosis not present

## 2016-02-02 DIAGNOSIS — I4891 Unspecified atrial fibrillation: Secondary | ICD-10-CM | POA: Diagnosis not present

## 2016-02-02 DIAGNOSIS — Z23 Encounter for immunization: Secondary | ICD-10-CM

## 2016-02-02 DIAGNOSIS — J441 Chronic obstructive pulmonary disease with (acute) exacerbation: Secondary | ICD-10-CM | POA: Diagnosis present

## 2016-02-02 DIAGNOSIS — N390 Urinary tract infection, site not specified: Secondary | ICD-10-CM | POA: Diagnosis present

## 2016-02-02 DIAGNOSIS — R000 Tachycardia, unspecified: Secondary | ICD-10-CM | POA: Diagnosis not present

## 2016-02-02 DIAGNOSIS — Z7901 Long term (current) use of anticoagulants: Secondary | ICD-10-CM

## 2016-02-02 DIAGNOSIS — K5641 Fecal impaction: Secondary | ICD-10-CM | POA: Diagnosis present

## 2016-02-02 DIAGNOSIS — J61 Pneumoconiosis due to asbestos and other mineral fibers: Secondary | ICD-10-CM | POA: Diagnosis present

## 2016-02-02 DIAGNOSIS — F17200 Nicotine dependence, unspecified, uncomplicated: Secondary | ICD-10-CM | POA: Diagnosis present

## 2016-02-02 DIAGNOSIS — E785 Hyperlipidemia, unspecified: Secondary | ICD-10-CM | POA: Diagnosis present

## 2016-02-02 DIAGNOSIS — M25552 Pain in left hip: Secondary | ICD-10-CM | POA: Diagnosis present

## 2016-02-02 DIAGNOSIS — F319 Bipolar disorder, unspecified: Secondary | ICD-10-CM | POA: Diagnosis present

## 2016-02-02 DIAGNOSIS — Z950 Presence of cardiac pacemaker: Secondary | ICD-10-CM | POA: Diagnosis present

## 2016-02-02 DIAGNOSIS — N189 Chronic kidney disease, unspecified: Secondary | ICD-10-CM

## 2016-02-02 DIAGNOSIS — G458 Other transient cerebral ischemic attacks and related syndromes: Secondary | ICD-10-CM

## 2016-02-02 DIAGNOSIS — J449 Chronic obstructive pulmonary disease, unspecified: Secondary | ICD-10-CM | POA: Diagnosis not present

## 2016-02-02 DIAGNOSIS — M6281 Muscle weakness (generalized): Secondary | ICD-10-CM

## 2016-02-02 DIAGNOSIS — I480 Paroxysmal atrial fibrillation: Secondary | ICD-10-CM | POA: Diagnosis not present

## 2016-02-02 DIAGNOSIS — R197 Diarrhea, unspecified: Secondary | ICD-10-CM

## 2016-02-02 DIAGNOSIS — Z96641 Presence of right artificial hip joint: Secondary | ICD-10-CM | POA: Diagnosis present

## 2016-02-02 DIAGNOSIS — G8929 Other chronic pain: Secondary | ICD-10-CM | POA: Diagnosis present

## 2016-02-02 DIAGNOSIS — E0590 Thyrotoxicosis, unspecified without thyrotoxic crisis or storm: Secondary | ICD-10-CM | POA: Diagnosis present

## 2016-02-02 DIAGNOSIS — F419 Anxiety disorder, unspecified: Secondary | ICD-10-CM | POA: Diagnosis present

## 2016-02-02 DIAGNOSIS — B9689 Other specified bacterial agents as the cause of diseases classified elsewhere: Secondary | ICD-10-CM | POA: Diagnosis not present

## 2016-02-02 DIAGNOSIS — I130 Hypertensive heart and chronic kidney disease with heart failure and stage 1 through stage 4 chronic kidney disease, or unspecified chronic kidney disease: Secondary | ICD-10-CM | POA: Diagnosis present

## 2016-02-02 DIAGNOSIS — R110 Nausea: Secondary | ICD-10-CM | POA: Diagnosis not present

## 2016-02-02 DIAGNOSIS — I4811 Longstanding persistent atrial fibrillation: Secondary | ICD-10-CM

## 2016-02-02 DIAGNOSIS — R079 Chest pain, unspecified: Secondary | ICD-10-CM

## 2016-02-02 DIAGNOSIS — I252 Old myocardial infarction: Secondary | ICD-10-CM

## 2016-02-02 DIAGNOSIS — H8110 Benign paroxysmal vertigo, unspecified ear: Secondary | ICD-10-CM

## 2016-02-02 DIAGNOSIS — N183 Chronic kidney disease, stage 3 (moderate): Secondary | ICD-10-CM | POA: Diagnosis present

## 2016-02-02 DIAGNOSIS — K219 Gastro-esophageal reflux disease without esophagitis: Secondary | ICD-10-CM | POA: Diagnosis present

## 2016-02-02 DIAGNOSIS — M25551 Pain in right hip: Secondary | ICD-10-CM | POA: Diagnosis present

## 2016-02-02 DIAGNOSIS — F17210 Nicotine dependence, cigarettes, uncomplicated: Secondary | ICD-10-CM | POA: Diagnosis present

## 2016-02-02 DIAGNOSIS — E860 Dehydration: Secondary | ICD-10-CM | POA: Diagnosis present

## 2016-02-02 DIAGNOSIS — R05 Cough: Secondary | ICD-10-CM | POA: Diagnosis not present

## 2016-02-02 DIAGNOSIS — B9620 Unspecified Escherichia coli [E. coli] as the cause of diseases classified elsewhere: Secondary | ICD-10-CM | POA: Diagnosis present

## 2016-02-02 DIAGNOSIS — I952 Hypotension due to drugs: Secondary | ICD-10-CM

## 2016-02-02 LAB — CBC
HCT: 47.8 % (ref 39.0–52.0)
HEMOGLOBIN: 15.5 g/dL (ref 13.0–17.0)
MCH: 29.8 pg (ref 26.0–34.0)
MCHC: 32.4 g/dL (ref 30.0–36.0)
MCV: 91.7 fL (ref 78.0–100.0)
PLATELETS: 166 10*3/uL (ref 150–400)
RBC: 5.21 MIL/uL (ref 4.22–5.81)
RDW: 15.4 % (ref 11.5–15.5)
WBC: 10.6 10*3/uL — AB (ref 4.0–10.5)

## 2016-02-02 LAB — URINE MICROSCOPIC-ADD ON

## 2016-02-02 LAB — COMPREHENSIVE METABOLIC PANEL
ALBUMIN: 3.7 g/dL (ref 3.5–5.0)
ALT: 11 U/L — ABNORMAL LOW (ref 17–63)
ANION GAP: 12 (ref 5–15)
AST: 20 U/L (ref 15–41)
Alkaline Phosphatase: 56 U/L (ref 38–126)
BILIRUBIN TOTAL: 1.8 mg/dL — AB (ref 0.3–1.2)
BUN: 24 mg/dL — ABNORMAL HIGH (ref 6–20)
CHLORIDE: 109 mmol/L (ref 101–111)
CO2: 21 mmol/L — ABNORMAL LOW (ref 22–32)
Calcium: 9.4 mg/dL (ref 8.9–10.3)
Creatinine, Ser: 2.16 mg/dL — ABNORMAL HIGH (ref 0.61–1.24)
GFR calc Af Amer: 33 mL/min — ABNORMAL LOW (ref >60)
GFR calc non Af Amer: 29 mL/min — ABNORMAL LOW (ref >60)
GLUCOSE: 87 mg/dL (ref 65–99)
POTASSIUM: 4.3 mmol/L (ref 3.5–5.1)
SODIUM: 142 mmol/L (ref 135–145)
TOTAL PROTEIN: 6.7 g/dL (ref 6.5–8.1)

## 2016-02-02 LAB — URINALYSIS, ROUTINE W REFLEX MICROSCOPIC
Glucose, UA: NEGATIVE mg/dL
HGB URINE DIPSTICK: NEGATIVE
Ketones, ur: 15 mg/dL — AB
NITRITE: NEGATIVE
PROTEIN: NEGATIVE mg/dL
Specific Gravity, Urine: 1.016 (ref 1.005–1.030)
pH: 5.5 (ref 5.0–8.0)

## 2016-02-02 LAB — MRSA PCR SCREENING: MRSA BY PCR: NEGATIVE

## 2016-02-02 LAB — LIPASE, BLOOD: LIPASE: 19 U/L (ref 11–51)

## 2016-02-02 LAB — I-STAT TROPONIN, ED: TROPONIN I, POC: 0.04 ng/mL (ref 0.00–0.08)

## 2016-02-02 LAB — TSH: TSH: 1.769 u[IU]/mL (ref 0.350–4.500)

## 2016-02-02 LAB — MAGNESIUM: MAGNESIUM: 1.7 mg/dL (ref 1.7–2.4)

## 2016-02-02 LAB — I-STAT CG4 LACTIC ACID, ED
LACTIC ACID, VENOUS: 0.96 mmol/L (ref 0.5–1.9)
Lactic Acid, Venous: 1.76 mmol/L (ref 0.5–1.9)

## 2016-02-02 IMAGING — CR DG CHEST 1V PORT
1 series · 1 of 1 positions shown · non-contrast
Comparison: Chest radiograph 10/24/2014

CLINICAL DATA: Patient with worsening shortness of breath.

EXAM:
PORTABLE CHEST - 1 VIEW

[AP]
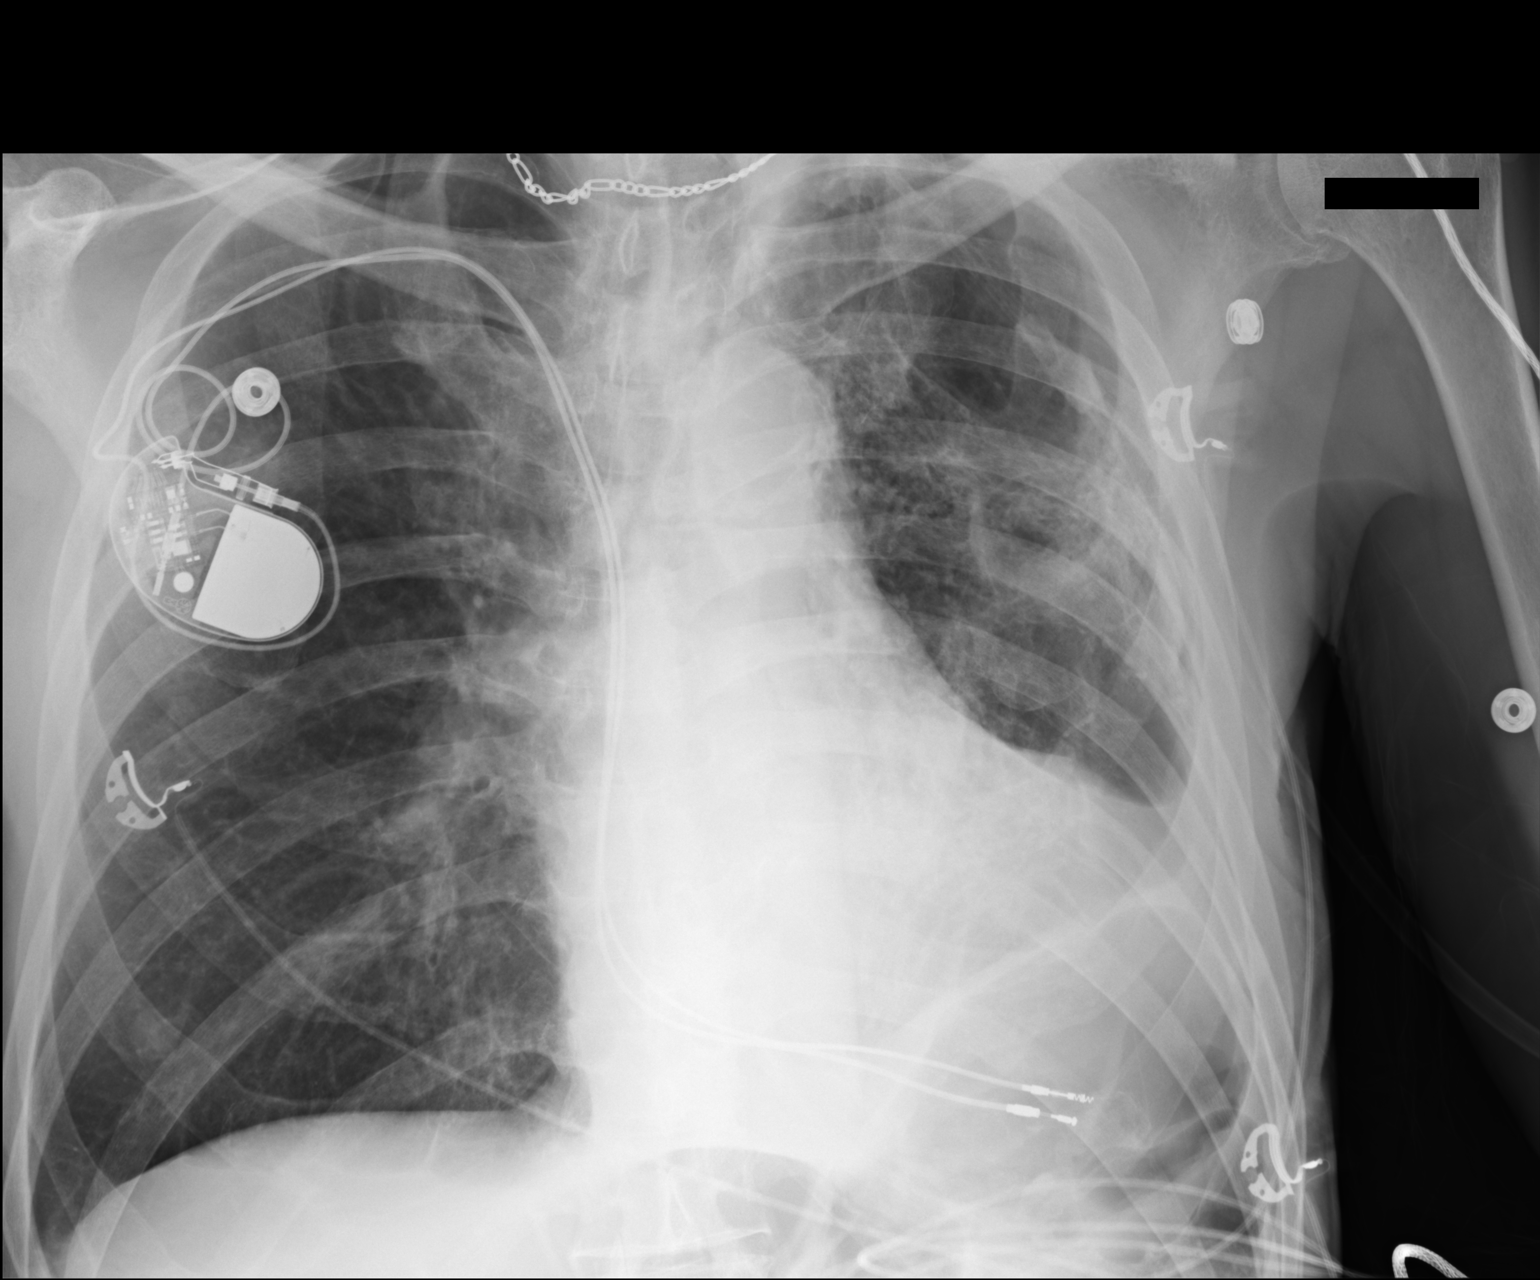

[1 of 1 positions shown; findings below may reference images not displayed]

FINDINGS: Multi lead pacer apparatus overlies the right hemithorax, leads are
stable in position. Stable cardiac and mediastinal contours. Right
lung is clear. Chronic elevation of the left hemidiaphragm with
stable chronic changes involving the left lung.
IMPRESSION: Stable chest radiograph.  No acute cardiopulmonary process.

## 2016-02-02 MED ORDER — PANTOPRAZOLE SODIUM 40 MG PO TBEC
40.0000 mg | DELAYED_RELEASE_TABLET | Freq: Every day | ORAL | Status: DC
  Administered 2016-02-02 – 2016-02-07 (×6): 40 mg via ORAL
  Filled 2016-02-02 (×6): qty 1

## 2016-02-02 MED ORDER — CIPROFLOXACIN HCL 500 MG PO TABS: 250.0000 mg | ORAL_TABLET | Freq: Two times a day (BID) | ORAL | Status: DC

## 2016-02-02 MED ORDER — VITAMIN D 1000 UNITS PO TABS
1000.0000 [IU] | ORAL_TABLET | Freq: Every day | ORAL | Status: DC
  Administered 2016-02-02 – 2016-02-07 (×6): 1000 [IU] via ORAL
  Filled 2016-02-02 (×6): qty 1

## 2016-02-02 MED ORDER — SODIUM CHLORIDE 0.9 % IV BOLUS (SEPSIS)
1000.0000 mL | Freq: Once | INTRAVENOUS | Status: AC
  Administered 2016-02-02: 1000 mL via INTRAVENOUS

## 2016-02-02 MED ORDER — ONDANSETRON HCL 4 MG/2ML IJ SOLN
4.0000 mg | Freq: Once | INTRAMUSCULAR | Status: AC
Start: 2016-02-02 — End: 2016-02-02
  Administered 2016-02-02: 4 mg via INTRAVENOUS
  Filled 2016-02-02: qty 2

## 2016-02-02 MED ORDER — ASPIRIN 81 MG PO CHEW
324.0000 mg | CHEWABLE_TABLET | Freq: Once | ORAL | Status: AC
  Administered 2016-02-02: 324 mg via ORAL
  Filled 2016-02-02: qty 4

## 2016-02-02 MED ORDER — BUPROPION HCL 75 MG PO TABS
75.0000 mg | ORAL_TABLET | Freq: Two times a day (BID) | ORAL | Status: DC
  Administered 2016-02-02 – 2016-02-07 (×10): 75 mg via ORAL
  Filled 2016-02-02 (×11): qty 1

## 2016-02-02 MED ORDER — TIOTROPIUM BROMIDE MONOHYDRATE 18 MCG IN CAPS
18.0000 ug | ORAL_CAPSULE | Freq: Every day | RESPIRATORY_TRACT | Status: DC
  Administered 2016-02-03 – 2016-02-07 (×4): 18 ug via RESPIRATORY_TRACT
  Filled 2016-02-02: qty 5

## 2016-02-02 MED ORDER — INFLUENZA VAC SPLIT QUAD 0.5 ML IM SUSY: 0.5000 mL | PREFILLED_SYRINGE | INTRAMUSCULAR | Status: DC | PRN

## 2016-02-02 MED ORDER — ASPIRIN 81 MG PO CHEW
81.0000 mg | CHEWABLE_TABLET | Freq: Every day | ORAL | Status: DC
  Administered 2016-02-03 – 2016-02-07 (×5): 81 mg via ORAL
  Filled 2016-02-02 (×6): qty 1

## 2016-02-02 MED ORDER — BOOST / RESOURCE BREEZE PO LIQD
1.0000 | Freq: Three times a day (TID) | ORAL | Status: DC
  Administered 2016-02-03 – 2016-02-07 (×11): 1 via ORAL

## 2016-02-02 MED ORDER — SODIUM CHLORIDE 0.9 % IV SOLN
1.0000 g | Freq: Once | INTRAVENOUS | Status: AC
  Administered 2016-02-02: 1 g via INTRAVENOUS
  Filled 2016-02-02: qty 10

## 2016-02-02 MED ORDER — CEFTRIAXONE SODIUM 1 G IJ SOLR
1.0000 g | Freq: Once | INTRAMUSCULAR | Status: AC
  Administered 2016-02-02: 1 g via INTRAVENOUS
  Filled 2016-02-02: qty 10

## 2016-02-02 MED ORDER — DILTIAZEM HCL-DEXTROSE 100-5 MG/100ML-% IV SOLN (PREMIX)
5.0000 mg/h | INTRAVENOUS | Status: DC
  Administered 2016-02-02: 10 mg/h via INTRAVENOUS
  Administered 2016-02-02: 5 mg/h via INTRAVENOUS
  Administered 2016-02-03 (×2): 12.5 mg/h via INTRAVENOUS
  Administered 2016-02-03: 7.5 mg/h via INTRAVENOUS
  Filled 2016-02-02 (×5): qty 100

## 2016-02-02 MED ORDER — SODIUM CHLORIDE 0.9 % IV SOLN
INTRAVENOUS | Status: DC
  Administered 2016-02-02: 75 mL/h via INTRAVENOUS
  Administered 2016-02-03: 07:00:00 via INTRAVENOUS

## 2016-02-02 MED ORDER — IOPAMIDOL (ISOVUE-300) INJECTION 61%
30.0000 mL | Freq: Once | INTRAVENOUS | Status: AC | PRN
  Administered 2016-02-02: 30 mL via ORAL

## 2016-02-02 MED ORDER — INFLUENZA VAC SPLIT QUAD 0.5 ML IM SUSY: 0.5000 mL | PREFILLED_SYRINGE | INTRAMUSCULAR | Status: DC

## 2016-02-02 MED ORDER — CIPROFLOXACIN HCL 500 MG PO TABS
250.0000 mg | ORAL_TABLET | ORAL | Status: DC
  Administered 2016-02-02 – 2016-02-03 (×2): 250 mg via ORAL
  Filled 2016-02-02 (×2): qty 1

## 2016-02-02 MED ORDER — APIXABAN 5 MG PO TABS
5.0000 mg | ORAL_TABLET | Freq: Two times a day (BID) | ORAL | Status: DC
  Administered 2016-02-02 – 2016-02-07 (×10): 5 mg via ORAL
  Filled 2016-02-02 (×10): qty 1

## 2016-02-02 MED ORDER — DILTIAZEM LOAD VIA INFUSION
10.0000 mg | Freq: Once | INTRAVENOUS | Status: AC
  Administered 2016-02-02: 10 mg via INTRAVENOUS
  Filled 2016-02-02: qty 10

## 2016-02-02 MED ORDER — ACETAMINOPHEN 325 MG PO TABS
650.0000 mg | ORAL_TABLET | Freq: Four times a day (QID) | ORAL | Status: DC | PRN
  Administered 2016-02-02: 650 mg via ORAL
  Filled 2016-02-02: qty 2

## 2016-02-02 MED ORDER — SODIUM CHLORIDE 0.9 % IV BOLUS (SEPSIS)
500.0000 mL | Freq: Once | INTRAVENOUS | Status: AC
  Administered 2016-02-02: 500 mL via INTRAVENOUS

## 2016-02-02 MED ORDER — DRONABINOL 2.5 MG PO CAPS
2.5000 mg | ORAL_CAPSULE | Freq: Two times a day (BID) | ORAL | Status: DC
  Administered 2016-02-04 – 2016-02-06 (×6): 2.5 mg via ORAL
  Filled 2016-02-02 (×6): qty 1

## 2016-02-02 MED ORDER — MIRTAZAPINE 15 MG PO TABS
30.0000 mg | ORAL_TABLET | Freq: Every day | ORAL | Status: DC
  Administered 2016-02-02 – 2016-02-05 (×2): 30 mg via ORAL
  Filled 2016-02-02 (×4): qty 2

## 2016-02-02 NOTE — ED Notes (Signed)
Dr. Amedeo Gory MD at bedside.

## 2016-02-02 NOTE — ED Notes (Signed)
Attempted to call report

## 2016-02-02 NOTE — ED Notes (Signed)
Dr. Dayna Barker made aware of 88/68.

## 2016-02-02 NOTE — ED Notes (Signed)
Cardizem not titrated due to SBP 88. Dr. Dayna Barker made aware.

## 2016-02-02 NOTE — ED Notes (Signed)
Per micromedex calcium gluconate compatible with Cardizem.

## 2016-02-02 NOTE — ED Notes (Signed)
Admitting MDs at bedside.

## 2016-02-02 NOTE — ED Triage Notes (Signed)
Pt arrives from home via [facility] reporting sharp L CP x 3 days with diarrhea. Pt reports weakness, SOB, Dizziness, denies N/V.  EMS reports initial HR 160-220, reports giving 565mL NS bolus followed by 10 mg Cardizem bolus, with some slowing of HR.  EMS reports giving total of 700 mL NS.

## 2016-02-02 NOTE — ED Notes (Signed)
Per pt okay to discuss al health care info in front of friend Diane.

## 2016-02-02 NOTE — H&P (Signed)
Date: 02/02/2016               Patient Name:  [name] MRN: [id number]  DOB: Feb 24, 1946 Age / Sex: 70 y.o., male   PCP: Travis Finders, MD         Medical Service: Internal Medicine Teaching Service         Attending Physician: Dr. Lucious Groves, DO    First Contact: [name]  Pager: [phone]  Second Contact: [name]  Pager: [phone]       After Hours (After 5p/  First Contact Pager: [phone]  weekends / holidays): Second Contact Pager: [phone]   Chief Complaint: diarrhea   History of Present Illness: [name] is a 70 y.o. male with a PMH of the COPD, major depression, asbestos pleural disease, hypertension, CHF, A. Fib (on DOAC), and CKD Stage 3 who presents with runny stool. He was experiencing constipation all of last week. This Monday he began experiencing episodes of abdominal pain and cramping followed by stool incontinence. The cramping abdominal pain occurs over the right side of his abdomen comes and goes throughout the day, associated with decreased appetite. It is 8/10 intensity and wakes him up from sleep at times. The pain is not associated with eating and it is relieved when he passes stool. Has about 5 bowel movements per day. He states he is chronically constipated but does not take any medications for this. Since Monday he has also started to experience sharp chest pain and palpitations. The chest pain is non radiating, worsened by movement and better when he stays still. She also states he's been having dysuria recently.  He denies nausea or vomiting. He denies fever, sick contacts, congestion, heartburn. He has no history of surgeries.   Meds:  Eliquis 5 mg daily Vitamin D Rosuvastatin 20 mg daily Furosemide 40 mg daily Bupropion 75 Mg daily Omeprazole 40 mg daily Aspirin Spiriva  Allergies: Allergies as of 02/02/2016 - Review Complete 02/02/2016  Allergen Reaction Noted  . Amiodarone Other (See Comments) 07/30/2015  .  Penicillins Rash    Past Medical History:  Diagnosis Date  . Alcohol abuse   . Anxiety   . Avascular necrosis of bones of both hips (HCC)    Status post bilateral hip replacements  . Bipolar disorder ([place])   . CHF (congestive heart failure) ([place])   . Chronic systolic heart failure ([place])    a. NICM - EF 35% with normal cors 2003. b. Last echo 11/2012 - EF 25-30%.  . CKD (chronic kidney disease)    Renal U/S 12/04/2009 showed no pathological findings. Labs 12/04/2009 include normal ESR, C3, C4; neg ANA; SPEP showed nonspecific increase in the alpha-2 region with no M-spike; UPEP showed no monoclonal free light chains; urine IFE showed polyclonal increase in feree Kappa and/or free Lambda light chains. Baseline Cr reported 1.7-2.5.  [name] COPD (chronic obstructive pulmonary disease) ([place])   . Depression   . DVT (deep venous thrombosis) ([place])    He has hypercoagulability with multiple prior DVTs  . E coli bacteremia   . Erectile dysfunction   . Gastritis   . GERD (gastroesophageal reflux disease)   . HCAP (healthcare-associated pneumonia) 08/24/2015  . Hydrocele, unspecified   . Hyperlipemia   . Hypertension   . 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  Thyroid scan 08/28/2009 was normal with no focal areas of abnormal increased or decreased activity seen;  the uptake of I 131 sodium iodide at 24 hours was 5.7%.  TSH and free T4 normalized by 08/16/2009.  . Intestinal obstruction ([place])   . Lung nodule    Chest CT scan on 12/14/2008 showed a nodular opacity at the left lung base felt to most likely represent scarring.  Follow-up chest CT scan on 06/02/2010 showed parenchymal scarring in the left apex, left  lower lobe, and lingula; some of this scarring at the left base had a nodular appearance, unchanged. Chest 09/2012 - stable.  . Noncompliance   . On home oxygen therapy    "3L periodically" (08/24/2015)  . Osteoarthritis   . Pleural plaque    H/o asbestos  exposure. Chest CT on 06/02/2010 showed stable extensive calcified pleural plaques involving the left hemithorax, consistent with asbestos related pleural disease.  . Portal vein thrombosis   . Spondylosis   . STEMI (ST elevation myocardial infarction) ([place]) 10/11/2014  . 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 b. St. Jude gen change 2010.  [name] Thrombocytopenia ([place])   . 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  . Weight loss    Normal colonoscopy by Dr. Olevia [name] on 11/06/2010.    Family History:  He does not know his family's medical history  Social History:  Lives nearby in [place] by himself. He smoked 3 cigarettes per week for the last 6 months. He denies alcohol or drug use.   Review of Systems: A complete ROS was negative except as per HPI.   Physical Exam: Vitals:   02/02/16 1700 02/02/16 1730 02/02/16 1800 02/02/16 1830  BP: 107/88 105/80 112/78   Pulse: (!) 49 (!) 143 (!) 44 62  Resp: 17 (!) '30 18 22  ' Temp:      TempSrc:      SpO2: 98% 97% 100% 98%  Weight:      Height:       Physical Exam  Constitutional: He is oriented to person, place, and time. He appears well-developed and well-nourished. No distress.  HENT:  Head: Normocephalic and atraumatic.  Eyes: EOM are normal. Pupils are equal, round, and reactive to light.  Neck: No tracheal deviation present. No thyromegaly present.  Cardiovascular: An irregular rhythm present. Tachycardia present.   No murmur heard. Pulmonary/Chest: He has no wheezes. He has no rales.  Abdominal: Soft. Bowel sounds are normal. He exhibits no distension and no mass. There is tenderness. There is no rebound and no guarding.  Right upper and lower quadrant tenderness  Musculoskeletal: Normal range of motion. He exhibits no edema.  Lymphadenopathy:    He has no cervical adenopathy.    Neurological: He is alert and oriented to person, place, and time.  Skin: Skin is warm and dry. No rash noted.  Psychiatric: He has a normal mood and affect. His behavior is normal.   Labs: CBC:  Recent Labs Lab 02/02/16 0919  WBC 10.6*  HGB 15.5  HCT 47.8  MCV 91.7  PLT 371   Basic Metabolic Panel:  Recent Labs Lab 02/02/16 0919  NA 142  K 4.3  CL 109  CO2 21*  GLUCOSE 87  BUN 24*  CREATININE 2.16*  CALCIUM 9.4  MG 1.7   Cardiac Enzymes:  Recent Labs Lab 02/02/16 0927  TROPIPOC 0.04   Liver Function Tests:  Recent Labs Lab 02/02/16 0919  AST 20  ALT 11*  ALKPHOS 56  BILITOT 1.8*  PROT 6.7  ALBUMIN 3.7    Recent Labs Lab 02/02/16 0919  LIPASE 19   Urine Studies: Urinalysis    Component Value Date/Time   COLORURINE AMBER (A) 02/02/2016 1258   APPEARANCEUR CLOUDY (A) 02/02/2016 1258   APPEARANCEUR Clear 06/20/2015 1405   LABSPEC 1.016 02/02/2016 1258   PHURINE 5.5 02/02/2016 1258   GLUCOSEU NEGATIVE 02/02/2016 1258   GLUCOSEU NEG mg/dL 12/04/2009 2201   HGBUR NEGATIVE 02/02/2016 1258   HGBUR trace-intact 09/01/2008 1533   BILIRUBINUR SMALL (A) 02/02/2016 1258   BILIRUBINUR Negative 06/20/2015 1405   KETONESUR 15 (A) 02/02/2016 1258   PROTEINUR NEGATIVE 02/02/2016 1258   UROBILINOGEN 1.0 11/11/2014 1518   NITRITE NEGATIVE 02/02/2016 1258   LEUKOCYTESUR MODERATE (A) 02/02/2016 1258   LEUKOCYTESUR Negative 06/20/2015 1405   EKG: EKG: Atrial flutter with right axis deviation  Imaging: Ct Abdomen Pelvis Wo Contrast  Result Date: 02/02/2016 CLINICAL DATA:  Right lower quadrant pain, diarrhea, vomiting EXAM: CT ABDOMEN AND PELVIS WITHOUT CONTRAST TECHNIQUE: Multidetector CT imaging of the abdomen and pelvis was performed following the standard protocol without IV contrast. COMPARISON:  PET-CT dated 11/05/2015. CT abdomen pelvis dated 10/13/2014. FINDINGS: Lower chest: Trace bilateral pleural effusions. Calcified left pleural plaques.  Rounded subpleural opacities in the left lower lobe (series 3/ images 6 and 14), chronic, favored to reflect rounded atelectasis. Associated volume loss with leftward cardiomediastinal shift. Mild cardiomegaly. ICD leads, incompletely visualized. Hepatobiliary: Unenhanced liver is unremarkable. Gallbladder is notable for layering sludge (series 2/ image 36), without associated inflammatory changes. Pancreas: Fatty parenchymal atrophy. Spleen: Within normal limits. Adrenals/Urinary Tract: Adrenal glands are within normal limits. Bilateral renal cortical scarring, right greater than left. No renal, ureteral, or bladder calculi. No hydronephrosis. Bladder is mildly thick-walled although underdistended. Stomach/Bowel: Stomach is within normal limits. No evidence of bowel obstruction. Distal appendix is mildly prominent, measuring 9 mm (series 2/image 37), previously 8 mm. No associated inflammatory changes to suggest acute appendicitis. Moderate rectal stool burden, suggesting rectal impaction, with associated wall thickening raising the possibility of stercoral colitis (series 2/ image 89). Vascular/Lymphatic: Atherosclerotic calcifications of the abdominal aorta and branch vessels. No evidence of abdominal aortic aneurysm. No suspicious abdominopelvic lymphadenopathy. Reproductive: Prostate is not discretely visualized/obscured by streak artifact. Other: No abdominopelvic ascites. Musculoskeletal: Degenerative changes of the visualized thoracolumbar spine. Grade 1 spondylolisthesis at L5-S1. Bilateral hip arthroplasties, in satisfactory position. IMPRESSION: Moderate rectal stool burden, suggesting rectal impaction. Associated rectal wall thickening raises the possibility of stercoral colitis. No evidence of bowel obstruction. Mildly prominent distal appendix, without associated inflammatory changes to suggest acute appendicitis. Layering gallbladder sludge, without associated inflammatory changes. Additional  ancillary findings as above. Electronically Signed   By: Julian Hy M.D.   On: 02/02/2016 13:39   Dg Chest Port 1 View  Result Date: 02/02/2016 CLINICAL DATA:  Shortness of breath, cough, chest pain EXAM: PORTABLE CHEST 1 VIEW COMPARISON:  Chest radiographs dated 12/07/2015. PET-CT dated 11/07/2015. FINDINGS: Right lung is essentially clear. Chronic interstitial lung disease/fibrosis involving the left lung with associated overlying chronic pleural thickening and calcified pleural plaques. Associated volume loss. The heart is normal in size.  Right subclavian pacemaker. IMPRESSION: Chronic interstitial lung disease/ fibrosis involving the left lung, as above. No evidence of acute cardiopulmonary disease. Electronically Signed   By: Julian Hy M.D.   On: 02/02/2016 10:02   Assessment & Plan by Problem: [name] is a 70 y.o. male with PMH COPD, major depression, asbestos pleural disease,  hypertension, CHF, A. Fib (on DOAC), and CKD Stage 3. Presented today with diarrhea secondary to fecal impaction.   Active Problems:   Essential hypertension   Cardiac pacemaker in situ   COPD (chronic obstructive pulmonary disease) (HCC)   G E R D   Chronic kidney disease (CKD), stage III (moderate)   Chronic combined systolic and diastolic congestive heart failure (HCC)   Asbestosis (HCC)   A-fib (HCC)  Atrial flutter He has a history of paroxysmal afib treated with home medications including aspirin 81, Eliquis 5 mg, diltiazem 120 mg daily, metoprolol 200 mg daily. This may be tachycardia secondary to dehydration in the setting of diarrhea. Started on IV fluids will continue to monitor  -Started diltiazem drip 5-15 mg/hr  -continued Eliquis, aspirin - admitted to telemetry   Noninfectious diarrhea secondary to fecal impaction - ordered fecal disimpaction  - continue home dronabinol 2.5 mg daily   UTI Urinalysis had moderate leukocytes and he has a history of UTI on prior  admission.  -Started Cipro 273m every 24 hours (renally dosed)  -follow up urine culture   Travis Creatinine 2.16 on admission with baseline 1.5-2.5. This may be prerenal in the setting of his dehydration. Will monitor for improvement with IV fluid rehydration.  Anxiety Continue home medications include bupropion 75 mg twice daily  Depression Continue home medication remeron 30 mg daily.   COPD home medications include albuterol inhaler, and spiriva inhaler  - continue spiriva, can add albuterol nebs if needed   History of CHF with reduced ejection fraction Home medications include lasix 40 mg daily  GERD home medication is omeprazole 40 mg daily  -ordered protonix 40 mg daily   F NS 75 ml/hr  E none  N clear liquid diet  DVT Ppx on eliquis  Code Status FULL, contact daughter sonya   Dispo: Admit patient to Inpatient with expected length of stay greater than 2 midnights.  Signed: NLedell Noss MD 02/02/2016, 6:41 PM  Pager: [phone]

## 2016-02-02 NOTE — ED Provider Notes (Signed)
[facility] DEPT Provider Note   CSN: [id number] Arrival date & time: 02/02/16  0849   History   Chief Complaint Chief Complaint  Patient presents with  . Chest Pain    HPI [name] is a 70 y.o. male.  The history is provided by the patient, medical records and the EMS personnel.   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   Onset-3 days ago. Constant since then. Severe. Worsened by any movement or exertion. Described as palpitations, substernal sharp nonradiating chest pain, and diffuse sharp and cramping abdominal pain that is somewhat worse on the right and in the lower abdomen. Alleviated somewhat by rest. Associated sx include nausea, one episode NBNB emesis, up to 5 loose nonbloody bowel movements per day, decreased UOP, dysuria, increased SOB from baseline (but has not required use of additional inhalers and pt has not been using his home o2). Weight currently 145lbs (prior dry weights reported around 149-150). Poor PO intake related to all of this. Denies fevers.   En route with EMS was found to have HR 160-220s. Was given total 700cc IVF and 7m dilt with improvement in HR by arrival here.    Past Medical History:  Diagnosis Date  . Alcohol abuse   . Anxiety   . Avascular necrosis of bones of both hips (HCC)    Status post bilateral hip replacements  . Bipolar disorder ([place]   . CHF (congestive heart failure) ([place]   . Chronic systolic heart failure ([facility]    a. NICM - EF 35% with normal cors 2003. b. Last echo 11/2012 - EF 25-30%.  . CKD (chronic kidney disease)    Renal U/S 12/04/2009 showed no pathological findings. Labs 12/04/2009 include normal ESR, C3, C4; neg ANA; SPEP  showed nonspecific increase in the alpha-2 region with no M-spike; UPEP showed no monoclonal free light chains; urine IFE showed polyclonal increase in feree Kappa and/or free Lambda light chains. Baseline Cr reported 1.7-2.5.  .Marland KitchenCOPD (chronic obstructive pulmonary disease) ([place]   . Depression   . DVT (deep venous thrombosis) ([facility]    He has hypercoagulability with multiple prior DVTs  . E coli bacteremia   . Erectile dysfunction   . Gastritis   . GERD (gastroesophageal reflux disease)   . HCAP (healthcare-associated pneumonia) 08/24/2015  . Hydrocele, unspecified   . Hyperlipemia   . Hypertension   . 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  Thyroid scan 08/28/2009 was normal with no focal areas of abnormal increased or decreased activity seen; the uptake of I 131 sodium iodide at 24 hours was 5.7%.  TSH and free T4 normalized by 08/16/2009.  . Intestinal obstruction ([place]   . Lung nodule    Chest CT scan on 12/14/2008 showed a nodular opacity at the left lung base felt to most likely represent scarring.  Follow-up chest CT scan on 06/02/2010 showed parenchymal scarring in the left apex, left  lower lobe, and lingula; some of this scarring at the left base had a nodular appearance, unchanged. Chest 09/2012 - stable.  . Noncompliance   . On home oxygen therapy    "3L periodically" (08/24/2015)  . Osteoarthritis   . Pleural plaque    H/o asbestos exposure. Chest CT on  06/02/2010 showed stable extensive calcified pleural plaques involving the left hemithorax, consistent with asbestos related pleural disease.  . Portal vein thrombosis   . Spondylosis   . STEMI (ST elevation myocardial infarction) ([place]) 10/11/2014  . Tachycardia-bradycardia syndrome [facility])    a. 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 b. St. Jude gen change 2010.  [name] Thrombocytopenia ([place])   . Thrombosis    History of arterial and venous thrombosis including portal vein thrombosis, deep vein thrombosis,  and superior mesenteric artery thrombosis.  . Weight loss    Normal colonoscopy by Dr. Olevia Perches on 11/06/2010.    Patient Active Problem List   Diagnosis Date Noted  . Weakness   . Major depression, recurrent, chronic ([place])   . Acute cystitis without hematuria   . Hyperkalemia 12/15/2015  . Hydrocele of testis   . Asbestosis ([place])   . CKD (chronic kidney disease)   . Coagulopathy ([place])   . Long term current use of opiate analgesic 06/27/2015  . Decreased appetite 06/19/2015  . Neuropathic pain of both feet ([place]) 05/03/2015  . Chronic pain 12/13/2014  . AKI (acute kidney injury) ([place])   . Longstanding persistent atrial fibrillation ([place]) 10/11/2014  . TIA (transient ischemic attack) 10/11/2014  . Healthcare maintenance 01/25/2014  . BPH (benign prostatic hyperplasia) 01/20/2014  . PVD (peripheral vascular disease) ([place]) 12/10/2013  . Chronic combined systolic and diastolic congestive heart failure ([place]) 06/20/2010  . Long-term (current) use of anticoagulants 06/05/2010  . H/O Arterial embolism of leg  06/05/2010  . Depression 01/19/2010  . Chronic kidney disease (CKD), stage III (moderate) 11/18/2009  . History of hyperthyroidism 03/13/2009  . Bilateral hip pain 03/23/2007  . OSTEOARTHRITIS 09/05/2006  . G E R D 09/04/2006  . COPD (chronic obstructive pulmonary disease) ([place]) 08/28/2006  . ERECTILE DYSFUNCTION 03/01/2006  . History of tobacco abuse 03/01/2006  . Essential hypertension 03/01/2006  . Cardiac pacemaker in situ 03/01/2006    Past Surgical History:  Procedure Laterality Date  . CARDIAC CATHETERIZATION  2003   Nl cors, EF 35%  . CARDIOVERSION N/A 05/27/2013   Procedure: CARDIOVERSION;  Surgeon: Candee Furbish, MD;  Location: [facility] ENDOSCOPY;  Service: Cardiovascular;  Laterality: N/A;  . EMBOLECTOMY Right 12/10/2013   Procedure: Right Femoral Embolectomy; Fasciotomy Right Lower Leg with Intraoperative arteriogram;  Surgeon: Rosetta Posner, MD;  Location: [facility];  Service:  Vascular;  Laterality: Right;  . EMBOLECTOMY Left 10/12/2014   Procedure: Left Femoral Thrombectomy with intraoperative arteriogram;  Surgeon: Rosetta Posner, MD;  Location: [facility];  Service: Vascular;  Laterality: Left;  . FASCIOTOMY CLOSURE Right 12/15/2013   Procedure: LEG FASCIOTOMY CLOSURE;  Surgeon: Rosetta Posner, MD;  Location: [facility];  Service: Vascular;  Laterality: Right;  . HEMIARTHROPLASTY HIP Left 09/09/2002   bipolar; Dr. Lafayette Dragon  . INSERT / REPLACE / REMOVE PACEMAKER  02/2003   Dr. Roe Rutherford, [employer] Medical pulse generator identity SR model [id number] serial [id number]; gen change by Greggory Brandy  . INSERT / REPLACE / REMOVE PACEMAKER  06/17/2008   Lead and generator change w/ [employer]. [employer] Medical Tendril ST model [id number] (serial  [id number]).       . INTRAOPERATIVE ARTERIOGRAM Right 12/10/2013   Procedure: INTRA OPERATIVE ARTERIOGRAM;  Surgeon: Rosetta Posner, MD;  Location: [facility];  Service: Vascular;  Laterality: Right;  . JOINT REPLACEMENT    . TEE WITHOUT CARDIOVERSION N/A 05/27/2013   Procedure: TRANSESOPHAGEAL ECHOCARDIOGRAM (TEE);  Surgeon: Candee Furbish, MD;  Location: [facility];  Service: Cardiovascular;  Laterality: N/A;  . TOTAL HIP ARTHROPLASTY Right 03/08/2008    By Dr. Hiram Comber       Home Medications    Prior to Admission medications   Medication Sig Start Date End Date Taking? Authorizing Provider  albuterol (VENTOLIN HFA) 108 (90 BASE) MCG/ACT inhaler Inhale 2 puffs into the lungs every 6 (six) hours as needed for wheezing or shortness of breath. 01/06/15  Yes Nischal Dareen Piano, MD  apixaban (ELIQUIS) 5 MG TABS tablet Take 1 tablet (5 mg total) by mouth 2 (two) times daily. 10/03/15  Yes Norval Gable, MD  aspirin 81 MG chewable tablet Chew 1 tablet (81 mg total) by mouth daily. 11/17/14  Yes Riccardo Dubin, MD  buPROPion (WELLBUTRIN) 75 MG tablet Take 1 tablet (75 mg total) by mouth 2 (two) times daily. 10/08/15  Yes Norval Gable, MD  cholecalciferol (VITAMIN D) 1000  units tablet Take 1 tablet (1,000 Units total) by mouth daily. 10/08/15  Yes Norval Gable, MD  dronabinol (MARINOL) 2.5 MG capsule Take 1 capsule (2.5 mg total) by mouth 2 (two) times daily before lunch and supper. 11/15/15  Yes Zada Finders, MD  furosemide (LASIX) 40 MG tablet Take 1 tablet (40 mg total) by mouth daily. 10/03/15  Yes Norval Gable, MD  mirtazapine (REMERON) 30 MG tablet Take 1 tablet (30 mg total) by mouth at bedtime. 01/16/16  Yes Zada Finders, MD  Multiple Vitamin (MULTIVITAMIN WITH MINERALS) TABS tablet Take 1 tablet by mouth daily. Reported on 10/11/2015   Yes Historical Provider, MD  omeprazole (PRILOSEC) 40 MG capsule Take 1 capsule (40 mg total) by mouth daily. 10/08/15  Yes Norval Gable, MD  oxyCODONE-acetaminophen (PERCOCET) 10-325 MG tablet Take 1 tablet by mouth every 4 (four) hours as needed for pain. Do not take more than 5 tablets in 24 hours. 11/07/15 11/05/16 Yes Zada Finders, MD  OXYGEN Inhale 3 L into the lungs as needed (shortness of breath).   Yes Historical Provider, MD  rosuvastatin (CRESTOR) 20 MG tablet Take 1 tablet (20 mg total) by mouth daily at 6 PM. Patient taking differently: Take 20 mg by mouth at bedtime.  10/08/15  Yes Norval Gable, MD  tiotropium (SPIRIVA) 18 MCG inhalation capsule Place 1 capsule (18 mcg total) into inhaler and inhale daily. 01/06/15  Yes Nischal Dareen Piano, MD  diltiazem (CARDIZEM CD) 120 MG 24 hr capsule Take 1 capsule (120 mg total) by mouth daily. 08/19/15   Zada Finders, MD  metoprolol (TOPROL-XL) 200 MG 24 hr tablet Take 200 mg by mouth daily.    Historical Provider, MD  senna-docusate ([employer] SENNA PLUS) 8.6-50 MG tablet Take 1 tablet by mouth daily as needed for moderate constipation. Patient not taking: Reported on 02/02/2016 08/08/15   Zada Finders, MD    Family History Family History  Problem Relation Age of Onset  . Hypertension Mother   . Asthma Mother   . Coronary artery disease Mother   . Cancer Mother   .  Cancer Brother     Unsure of type.  . Stroke      mother  . Heart attack      brother and sister ?age   . Heart disease Brother   . Colon cancer Neg Hx   . Lung cancer Neg Hx   . Prostate cancer Neg Hx     Social History Social History  Substance Use Topics  . Smoking status: Current Some Day Smoker    Packs/day: 0.33  Years: 54.00    Types: Cigarettes    Last attempt to quit: 05/21/2015  . Smokeless tobacco: Never Used  . Alcohol use No     Comment: 6/22//2016 "h/o etoh abuse; stopped drinking ~ 2012"     Allergies   Amiodarone and Penicillins   Review of Systems Review of Systems  Constitutional: Negative for fever.  HENT: Negative for rhinorrhea.   Respiratory: Positive for cough and shortness of breath.   Cardiovascular: Positive for chest pain and palpitations. Negative for leg swelling.  Gastrointestinal: Positive for abdominal pain, diarrhea, nausea and vomiting. Negative for blood in stool.  Genitourinary: Positive for decreased urine volume and dysuria.  Skin: Negative for rash.  Neurological: Positive for light-headedness.  Hematological: Bruises/bleeds easily.  All other systems reviewed and are negative.   Physical Exam Updated Vital Signs BP 121/95   Pulse 106   Temp 98.2 F (36.8 C) (Oral)   Resp (!) 27   Ht '6\' 3"'  (1.905 m)   Wt 65.8 kg   SpO2 99%   BMI 18.12 kg/m   Physical Exam  Constitutional: He appears well-developed and well-nourished.  HENT:  Head: Normocephalic and atraumatic.  Eyes: Conjunctivae are normal.  Neck: Neck supple.  Cardiovascular: An irregularly irregular rhythm present. Tachycardia present.   No murmur heard. Pulmonary/Chest: Effort normal. No respiratory distress. He has wheezes.  Scattered mild wheezing bilat. Left middle and lower coarse rhonchi  Abdominal: Soft. There is tenderness (RUQ, RLQ, suprapubic, much less so left sided abd, with guarding on right side and suprapubic area). There is guarding.    Musculoskeletal: He exhibits no edema.  Neurological: He is alert.  Skin: Skin is warm and dry.  Psychiatric: He has a normal mood and affect.  Nursing note and vitals reviewed.   ED Treatments / Results  Labs (all labs ordered are listed, but only abnormal results are displayed) Labs Reviewed  CBC - Abnormal; Notable for the following:       Result Value   WBC 10.6 (*)    All other components within normal limits  COMPREHENSIVE METABOLIC PANEL - Abnormal; Notable for the following:    CO2 21 (*)    BUN 24 (*)    Creatinine, Ser 2.16 (*)    ALT 11 (*)    Total Bilirubin 1.8 (*)    GFR calc non Af Amer 29 (*)    GFR calc Af Amer 33 (*)    All other components within normal limits  URINALYSIS, ROUTINE W REFLEX MICROSCOPIC (NOT AT [facility]) - Abnormal; Notable for the following:    Color, Urine AMBER (*)    APPearance CLOUDY (*)    Bilirubin Urine SMALL (*)    Ketones, ur 15 (*)    Leukocytes, UA MODERATE (*)    All other components within normal limits  URINE MICROSCOPIC-ADD ON - Abnormal; Notable for the following:    Squamous Epithelial / LPF 0-5 (*)    Bacteria, UA MANY (*)    Casts HYALINE CASTS (*)    All other components within normal limits  TSH  LIPASE, BLOOD  MAGNESIUM  I-STAT TROPOININ, ED  I-STAT CG4 LACTIC ACID, ED  I-STAT CG4 LACTIC ACID, ED    EKG  EKG Interpretation  Date/Time:  Friday February 02 2016 08:56:51 EDT Ventricular Rate:  131 PR Interval:    QRS Duration: 103 QT Interval:  315 QTC Calculation: 436 R Axis:   98 Text Interpretation:  Atrial flutter Right axis deviation Borderline repolarization abnormality  Confirmed by [facility] MD, Corene Cornea [phone]) on 02/02/2016 9:20:31 AM       Radiology Ct Abdomen Pelvis Wo Contrast  Result Date: 02/02/2016 CLINICAL DATA:  Right lower quadrant pain, diarrhea, vomiting EXAM: CT ABDOMEN AND PELVIS WITHOUT CONTRAST TECHNIQUE: Multidetector CT imaging of the abdomen and pelvis was performed following the  standard protocol without IV contrast. COMPARISON:  PET-CT dated 11/05/2015. CT abdomen pelvis dated 10/13/2014. FINDINGS: Lower chest: Trace bilateral pleural effusions. Calcified left pleural plaques. Rounded subpleural opacities in the left lower lobe (series 3/ images 6 and 14), chronic, favored to reflect rounded atelectasis. Associated volume loss with leftward cardiomediastinal shift. Mild cardiomegaly. ICD leads, incompletely visualized. Hepatobiliary: Unenhanced liver is unremarkable. Gallbladder is notable for layering sludge (series 2/ image 36), without associated inflammatory changes. Pancreas: Fatty parenchymal atrophy. Spleen: Within normal limits. Adrenals/Urinary Tract: Adrenal glands are within normal limits. Bilateral renal cortical scarring, right greater than left. No renal, ureteral, or bladder calculi. No hydronephrosis. Bladder is mildly thick-walled although underdistended. Stomach/Bowel: Stomach is within normal limits. No evidence of bowel obstruction. Distal appendix is mildly prominent, measuring 9 mm (series 2/image 37), previously 8 mm. No associated inflammatory changes to suggest acute appendicitis. Moderate rectal stool burden, suggesting rectal impaction, with associated wall thickening raising the possibility of stercoral colitis (series 2/ image 89). Vascular/Lymphatic: Atherosclerotic calcifications of the abdominal aorta and branch vessels. No evidence of abdominal aortic aneurysm. No suspicious abdominopelvic lymphadenopathy. Reproductive: Prostate is not discretely visualized/obscured by streak artifact. Other: No abdominopelvic ascites. Musculoskeletal: Degenerative changes of the visualized thoracolumbar spine. Grade 1 spondylolisthesis at L5-S1. Bilateral hip arthroplasties, in satisfactory position. IMPRESSION: Moderate rectal stool burden, suggesting rectal impaction. Associated rectal wall thickening raises the possibility of stercoral colitis. No evidence of bowel  obstruction. Mildly prominent distal appendix, without associated inflammatory changes to suggest acute appendicitis. Layering gallbladder sludge, without associated inflammatory changes. Additional ancillary findings as above. Electronically Signed   By: Julian Hy M.D.   On: 02/02/2016 13:39   Dg Chest Port 1 View  Result Date: 02/02/2016 CLINICAL DATA:  Shortness of breath, cough, chest pain EXAM: PORTABLE CHEST 1 VIEW COMPARISON:  Chest radiographs dated 12/07/2015. PET-CT dated 11/07/2015. FINDINGS: Right lung is essentially clear. Chronic interstitial lung disease/fibrosis involving the left lung with associated overlying chronic pleural thickening and calcified pleural plaques. Associated volume loss. The heart is normal in size.  Right subclavian pacemaker. IMPRESSION: Chronic interstitial lung disease/ fibrosis involving the left lung, as above. No evidence of acute cardiopulmonary disease. Electronically Signed   By: Julian Hy M.D.   On: 02/02/2016 10:02    Procedures Procedures (including critical care time)  Medications Ordered in ED Medications  diltiazem (CARDIZEM) 1 mg/mL load via infusion 10 mg (10 mg Intravenous Bolus from Bag 02/02/16 1051)    And  diltiazem (CARDIZEM) 100 mg in dextrose 5% 181m (1 mg/mL) infusion (7.5 mg/hr Intravenous Rate/Dose Change 02/02/16 1528)  aspirin chewable tablet 324 mg (324 mg Oral Given 02/02/16 1053)  ondansetron (ZOFRAN) injection 4 mg (4 mg Intravenous Given 02/02/16 1047)  sodium chloride 0.9 % bolus 500 mL (0 mLs Intravenous Stopped 02/02/16 1054)  sodium chloride 0.9 % bolus 1,000 mL (0 mLs Intravenous Stopped 02/02/16 1136)  iopamidol (ISOVUE-300) 61 % injection 30 mL (30 mLs Oral Contrast Given 02/02/16 1035)  calcium gluconate 1 g in sodium chloride 0.9 % 100 mL IVPB (0 g Intravenous Stopped 02/02/16 1339)     Initial Impression / Assessment and Plan / ED Course  I  have reviewed the triage vital signs and the nursing  notes.  Pertinent labs & imaging results that were available during my care of the patient were reviewed by me and considered in my medical decision making (see chart for details).  Clinical Course     70 year old male with history of CHF, CAD status post MI, tachycardia-bradycardia syndrome status post pacemaker, COPD on home oxygen (3L that he uses intermittently), CKD, hypercoaguability with recurrent DVTs and arterial thromboses, HTN, HLD, hx hyperthyroidism that resolved presenting with chest and abdominal pain, palpitations as above. AF, tachycardic to 140s upon arrival with stable BP and normal o2 sats on room air, after receiving 700 cc IVF and 65m dilt with EMS.   Feel overall clinical presentation more likely triggered by acute GI etiology leading to some volume depletion and uncontrolled atrial flutter - AKI on CKD c/w this, with decreased weight from dry weight and tachycardia. Given additional 1.5L bolus here with some initial improvement in HR. However, pt HR increased and remained with atrial flutter, necessitating dilt bolus and gtt.   CT concerning for stercoral colitis. Pt having numerous bowel movements, clinically not obstructed. No evidence of acute surgical biliary pathology, appendicitis, or SBO.  Admitted to Internal Medicine service for further care.   Case discussed with Dr. MDayna Barkerwho oversaw management of this patient.    Final Clinical Impressions(s) / ED Diagnoses   Final diagnoses:  Chest pain, unspecified chest pain type  Atrial fibrillation with rapid ventricular response (HCC)  Colitis  Diarrhea, unspecified type  AKI (acute kidney injury) ([facility]    New Prescriptions New Prescriptions   No medications on file     JIvin Booty MD 02/02/16 1[place] MD 02/02/16 [phone]

## 2016-02-02 NOTE — ED Notes (Signed)
2 RN's had total of 3 unsuccessful attempts to establish 2nd IV.  Pt refuses more attempts.  MD made aware.

## 2016-02-02 NOTE — ED Notes (Signed)
Cardizem not titrated d/t SBP 102. Dr.Mesner made aware.

## 2016-02-03 DIAGNOSIS — K219 Gastro-esophageal reflux disease without esophagitis: Secondary | ICD-10-CM

## 2016-02-03 DIAGNOSIS — N183 Chronic kidney disease, stage 3 (moderate): Secondary | ICD-10-CM

## 2016-02-03 DIAGNOSIS — K5641 Fecal impaction: Secondary | ICD-10-CM

## 2016-02-03 DIAGNOSIS — Z87440 Personal history of urinary (tract) infections: Secondary | ICD-10-CM

## 2016-02-03 DIAGNOSIS — B9689 Other specified bacterial agents as the cause of diseases classified elsewhere: Secondary | ICD-10-CM

## 2016-02-03 LAB — BASIC METABOLIC PANEL
Anion gap: 6 (ref 5–15)
BUN: 19 mg/dL (ref 6–20)
CALCIUM: 8.8 mg/dL — AB (ref 8.9–10.3)
CO2: 21 mmol/L — ABNORMAL LOW (ref 22–32)
CREATININE: 1.63 mg/dL — AB (ref 0.61–1.24)
Chloride: 114 mmol/L — ABNORMAL HIGH (ref 101–111)
GFR calc Af Amer: 47 mL/min — ABNORMAL LOW (ref >60)
GFR, EST NON AFRICAN AMERICAN: 40 mL/min — AB (ref >60)
GLUCOSE: 124 mg/dL — AB (ref 65–99)
Potassium: 3.9 mmol/L (ref 3.5–5.1)
Sodium: 141 mmol/L (ref 135–145)

## 2016-02-03 LAB — CBC
HCT: 40.8 % (ref 39.0–52.0)
HEMOGLOBIN: 12.9 g/dL — AB (ref 13.0–17.0)
MCH: 29.3 pg (ref 26.0–34.0)
MCHC: 31.6 g/dL (ref 30.0–36.0)
MCV: 92.7 fL (ref 78.0–100.0)
PLATELETS: 158 10*3/uL (ref 150–400)
RBC: 4.4 MIL/uL (ref 4.22–5.81)
RDW: 15.2 % (ref 11.5–15.5)
WBC: 8.5 10*3/uL (ref 4.0–10.5)

## 2016-02-03 MED ORDER — OXYCODONE HCL 5 MG PO TABS
5.0000 mg | ORAL_TABLET | ORAL | Status: DC | PRN
  Administered 2016-02-03 – 2016-02-07 (×6): 5 mg via ORAL
  Filled 2016-02-03 (×6): qty 1

## 2016-02-03 MED ORDER — OXYCODONE-ACETAMINOPHEN 10-325 MG PO TABS: 1.0000 | ORAL_TABLET | ORAL | Status: DC | PRN

## 2016-02-03 MED ORDER — METOPROLOL SUCCINATE ER 100 MG PO TB24
200.0000 mg | ORAL_TABLET | Freq: Every day | ORAL | Status: DC
  Administered 2016-02-03 – 2016-02-06 (×4): 200 mg via ORAL
  Filled 2016-02-03 (×5): qty 2

## 2016-02-03 MED ORDER — OXYCODONE-ACETAMINOPHEN 5-325 MG PO TABS
1.0000 | ORAL_TABLET | ORAL | Status: DC | PRN
  Administered 2016-02-03 – 2016-02-07 (×9): 1 via ORAL
  Filled 2016-02-03 (×9): qty 1

## 2016-02-03 NOTE — Progress Notes (Signed)
   Subjective: No acute events overnight. Patient continues to have abdominal pain and diarrhea. He denies chest pain but does endorse mild shortness of breath. He also endorses increased hip pain. This hip pain is a chronic issue for the patient. He has no additional acute complaints or concerns this morning.  Objective:  Vital signs in last 24 hours: Vitals:   02/03/16 0555 02/03/16 0824 02/03/16 0831 02/03/16 0900  BP: 98/73   97/75  Pulse: (!) 38   (!) 58  Resp: 19   (!) 22  Temp:   98.2 F (36.8 C)   TempSrc:   Oral   SpO2: 99% 100%  98%  Weight:      Height:       Physical Exam  Constitutional: He is oriented to person, place, and time. He appears well-developed and well-nourished.  Resting comfortably and in no acute distress  HENT:  Head: Normocephalic and atraumatic.  Cardiovascular:  Patient tachycardic with an irregularly irregular rhythm, no murmurs were appreciated  Respiratory: Effort normal and breath sounds normal. No respiratory distress. He has no wheezes.  GI: He exhibits no distension. There is tenderness.  Mild to his to palpation in all 4 abdominal quadrants. Bowel sounds were active and borderline hyperactive. No abdominal bruits were auscultated. There is no distention, rebound or guarding.  Musculoskeletal: He exhibits no edema.  Neurological: He is alert and oriented to person, place, and time.     Assessment/Plan: Mr. Marder is a 70 year old male with a past medical history of COPD, major depression, asbestos pleural disease, hypertension, congestive heart failure, A. fib and CKD stage III who presented with diarrhea most likely secondary to fecal impaction.  1. Stercoral Colitis  Patient complaining of diarrhea. Abdominal imaging revealed heavy colonic stool burden and fecal impaction with surrounding inflammation most likely consistent with stercoral colitis. -- Fecal disimpaction -- Enema -- Continue home dronabinol 2.5 mg daily  2. Atrial  flutter Patient has a history of atrial fibrillation. He remains tachycardic with an irregularly irregular rhythm on examination this morning. -- Wean diltiazem drip currently set at 5-15 mg/h and resume home regimen with metoprolol XR 200mg  once daily and resume home dose diltiazem -- Continue Eliquis and aspirin -- Continue telemetry  3. Urinary tract infection Urinalysis dementia rating moderate leukocytosis. Has a history of urinary tract infection on prior admission. -- Started ciprofloxacin 250 mg every 24 hours (renally dosed) -- Follow-up urine culture and sensitivity  4. Acute kidney injury Patient with a creatinine of 2.16 on admission his baseline is around 1.5-2.5. This is most likely prerenal in the setting of dehydration. -- Repeat BMP -- Continue IV fluids with normal saline at 100 mL per hour  5. Anxiety and depression -- Continue home bupropion 75 mg twice daily -- Continue home Remeron 30 mg daily  6. COPD -- Continue Spiriva -- Albuterol when necessary  7. DVT/PE prophylaxis -- Therapeutically anticoagulated for atrial flutter with Eliquis  Dispo: Anticipated discharge in approximately 1-2 day(s).   Ophelia Shoulder, MD 02/03/2016, 11:09 AM Pager: [phone]

## 2016-02-03 NOTE — Progress Notes (Signed)
Pt made aware of order for Soap Suds Enema. Pt refuses at this time. Pt encouraged OOB to Chair for dinner, says he will, but refuses at this time. Pt Passing flatus. Preparing to eat dinner. No further needs expressed at this time.

## 2016-02-03 NOTE — Progress Notes (Signed)
Dr Hetty Ely ha called back and was updated on patient's condition and VS, she got in touch with Dr. [name] and he called back, was updated on today's VS and condition. Patient's HR has been 100-130's but not sustained but SBP has been dropping but patient has an EF of 30% and we have to watch giving him fluids. Dr [name] said that he was okay with HR in the 130's as long as patient remained asymptomatic and it wasn't a sustained rate and that he would increase his IVF's from 75cc to 100cc/hour, will continue to monitor.

## 2016-02-03 NOTE — Plan of Care (Signed)
Problem: Safety: Goal: Ability to remain free from injury will improve Outcome: Progressing Patient instructed on importance of calling for assistance and how to call via the phone of the call light, bed alarm on and patient is aware and is okay with it to remind him to call for assistance, will continue to monitor.

## 2016-02-03 NOTE — Plan of Care (Signed)
Problem: Safety: Goal: Ability to remain free from injury will improve Outcome: Progressing Patient instructed on how to use the call light and the phone to reach his RN/NT for assistance and instructed that the bed alarm would be on at night and it is only to remind him not to get OOB without help, will continue to monitor and reinforce.

## 2016-02-03 NOTE — Progress Notes (Signed)
Called and left message with IM service regarding titration of Cardizem drip, SBP is starting to drop into the 80's but MAP remains in the 65-80 range and patient is asymptomatic but HR is starting to become harder to manage, no c/o pain and no other concerns at this time, will await call back.

## 2016-02-04 MED ORDER — CIPROFLOXACIN HCL 500 MG PO TABS
250.0000 mg | ORAL_TABLET | Freq: Two times a day (BID) | ORAL | Status: DC
  Administered 2016-02-04 – 2016-02-05 (×2): 250 mg via ORAL
  Filled 2016-02-04 (×2): qty 1

## 2016-02-04 MED ORDER — DILTIAZEM HCL 30 MG PO TABS
30.0000 mg | ORAL_TABLET | Freq: Four times a day (QID) | ORAL | Status: DC
  Administered 2016-02-04 – 2016-02-06 (×9): 30 mg via ORAL
  Filled 2016-02-04 (×9): qty 1

## 2016-02-04 NOTE — Progress Notes (Signed)
PT Cancellation Note  Patient Details Name: [name] MRN: [id number] DOB: 1945-11-19   Cancelled Treatment:    Pt adamantly refused PT eval because the football game was on. Pt reports he is going home tomorrow whether medically released or not.  [place], [place] 02/04/2016, 1:31 PM

## 2016-02-04 NOTE — Progress Notes (Signed)
Internal Medicine Attending:   I saw and examined the patient. I reviewed the resident's note and I agree with the resident's findings and plan as documented in the resident's note. Patient feels much better, had large BM with enema, HR now well controlled. WIll monitor for hypotension overnight and if stable will convert diltiazem to extended release and discharge tomorrow.

## 2016-02-04 NOTE — Progress Notes (Signed)
   Subjective: No acute events overnight. Pt just had a large bowel movement with great improvement in symptoms. He is sitting up in bed eating a reg diet breakfast.  Objective:  Vital signs in last 24 hours: Vitals:   02/03/16 2215 02/04/16 0041 02/04/16 0225 02/04/16 0500  BP: 101/74  117/83 93/67  Pulse:    63  Resp: (!) 21 (!) 23 (!) 23 18  Temp:  98.2 F (36.8 C)  98 F (36.7 C)  TempSrc:      SpO2:    95%  Weight:    148 lb (67.1 kg)  Height:       Physical Exam  Constitutional: He is oriented to person, place, and time. He appears well-developed and well-nourished.  Pt sitting up in bed eating breakfast   HENT:  Head: Normocephalic and atraumatic.  Cardiovascular:  Patient tachycardic with an irregularly irregular rhythm, no murmurs were appreciated  Respiratory: Effort normal and breath sounds normal. No respiratory distress. He has no wheezes.  GI: He exhibits no distension. There is tenderness (RLQ).  Musculoskeletal: He exhibits no edema.  Neurological: He is alert and oriented to person, place, and time.     Assessment/Plan: Mr. Shami is a 70 year old male with a past medical history of COPD, major depression, asbestos pleural disease, hypertension, congestive heart failure, A. fib and CKD stage III who presented with diarrhea most likely secondary to fecal impaction.  1. Stercoral Colitis -- rectal disimpaction was unsuccessful however he had a large bowel movement this morning and feels better. He is eating a full diet and is more interactive this morning.  - CTM, PT consulted  2. Atrial fib w/ RVR-- dilt gtt stopped, HR in the 60-70's.  -- metoprolol XR 200mg  once daily  -- Continue Eliquis and aspirin -- Continue telemetry -- started on diltazem 30mg  QID and will monitor for hypotension. Home diltiazem dose is 120mg  XL  3. Urinary tract infection- received one dose of rocephin in the ED then started on cipro. -- Started ciprofloxacin 250 mg every 24  hours (renally dosed) -- Follow-up urine culture and sensitivity  4. Acute kidney injury- resolving - d/c'd fluids now that he is eating - f/u in clinic for repeat BMEt  5. Anxiety and depression -- Continue home bupropion 75 mg twice daily -- Continue home Remeron 30 mg daily  6. COPD -- Continue Spiriva -- Albuterol when necessary  7. DVT/PE prophylaxis --  Eliquis  Dispo: Anticipated discharge tomorrow   Norman Herrlich, MD 02/04/2016, 8:31 AM Pager: [phone]

## 2016-02-05 ENCOUNTER — Telehealth: Payer: Self-pay | Admitting: Internal Medicine

## 2016-02-05 ENCOUNTER — Other Ambulatory Visit: Payer: Self-pay | Admitting: Internal Medicine

## 2016-02-05 DIAGNOSIS — B9620 Unspecified Escherichia coli [E. coli] as the cause of diseases classified elsewhere: Secondary | ICD-10-CM

## 2016-02-05 DIAGNOSIS — H8110 Benign paroxysmal vertigo, unspecified ear: Secondary | ICD-10-CM

## 2016-02-05 DIAGNOSIS — I4891 Unspecified atrial fibrillation: Secondary | ICD-10-CM

## 2016-02-05 DIAGNOSIS — F418 Other specified anxiety disorders: Secondary | ICD-10-CM

## 2016-02-05 DIAGNOSIS — Z7901 Long term (current) use of anticoagulants: Secondary | ICD-10-CM

## 2016-02-05 DIAGNOSIS — R110 Nausea: Secondary | ICD-10-CM

## 2016-02-05 DIAGNOSIS — G8929 Other chronic pain: Secondary | ICD-10-CM

## 2016-02-05 DIAGNOSIS — N179 Acute kidney failure, unspecified: Secondary | ICD-10-CM

## 2016-02-05 DIAGNOSIS — Z7951 Long term (current) use of inhaled steroids: Secondary | ICD-10-CM

## 2016-02-05 DIAGNOSIS — N390 Urinary tract infection, site not specified: Secondary | ICD-10-CM

## 2016-02-05 DIAGNOSIS — J449 Chronic obstructive pulmonary disease, unspecified: Secondary | ICD-10-CM

## 2016-02-05 DIAGNOSIS — K5289 Other specified noninfective gastroenteritis and colitis: Secondary | ICD-10-CM

## 2016-02-05 DIAGNOSIS — Z1623 Resistance to quinolones and fluoroquinolones: Secondary | ICD-10-CM

## 2016-02-05 DIAGNOSIS — Z79899 Other long term (current) drug therapy: Secondary | ICD-10-CM

## 2016-02-05 LAB — BASIC METABOLIC PANEL
Anion gap: 7 (ref 5–15)
BUN: 19 mg/dL (ref 6–20)
CHLORIDE: 112 mmol/L — AB (ref 101–111)
CO2: 22 mmol/L (ref 22–32)
Calcium: 8.8 mg/dL — ABNORMAL LOW (ref 8.9–10.3)
Creatinine, Ser: 1.74 mg/dL — ABNORMAL HIGH (ref 0.61–1.24)
GFR calc Af Amer: 43 mL/min — ABNORMAL LOW (ref >60)
GFR calc non Af Amer: 37 mL/min — ABNORMAL LOW (ref >60)
GLUCOSE: 98 mg/dL (ref 65–99)
POTASSIUM: 4 mmol/L (ref 3.5–5.1)
SODIUM: 141 mmol/L (ref 135–145)

## 2016-02-05 LAB — URINE CULTURE: Culture: >=100000 — AB

## 2016-02-05 LAB — CBC
HEMATOCRIT: 41.8 % (ref 39.0–52.0)
Hemoglobin: 13.2 g/dL (ref 13.0–17.0)
MCH: 29.1 pg (ref 26.0–34.0)
MCHC: 31.6 g/dL (ref 30.0–36.0)
MCV: 92.1 fL (ref 78.0–100.0)
Platelets: 168 10*3/uL (ref 150–400)
RBC: 4.54 MIL/uL (ref 4.22–5.81)
RDW: 15.3 % (ref 11.5–15.5)
WBC: 7 10*3/uL (ref 4.0–10.5)

## 2016-02-05 LAB — GLUCOSE, CAPILLARY: GLUCOSE-CAPILLARY: 113 mg/dL — AB (ref 65–99)

## 2016-02-05 MED ORDER — DILTIAZEM HCL ER 60 MG PO CP12: 60.0000 mg | ORAL_CAPSULE | Freq: Two times a day (BID) | ORAL | 3 refills | Status: DC

## 2016-02-05 MED ORDER — METOPROLOL SUCCINATE ER 200 MG PO TB24: 200.0000 mg | ORAL_TABLET | Freq: Every day | ORAL | 3 refills | Status: DC

## 2016-02-05 MED ORDER — OXYCODONE-ACETAMINOPHEN 10-325 MG PO TABS: 1.0000 | ORAL_TABLET | ORAL | 0 refills | Status: DC | PRN

## 2016-02-05 MED ORDER — SULFAMETHOXAZOLE-TRIMETHOPRIM 800-160 MG PO TABS
1.0000 | ORAL_TABLET | Freq: Two times a day (BID) | ORAL | Status: DC
  Administered 2016-02-05 – 2016-02-07 (×5): 1 via ORAL
  Filled 2016-02-05 (×5): qty 1

## 2016-02-05 MED ORDER — SULFAMETHOXAZOLE-TRIMETHOPRIM 800-160 MG PO TABS: 1.0000 | ORAL_TABLET | Freq: Two times a day (BID) | ORAL | 0 refills | Status: AC

## 2016-02-05 MED ORDER — MECLIZINE HCL 25 MG PO TABS
25.0000 mg | ORAL_TABLET | Freq: Once | ORAL | Status: AC
  Administered 2016-02-05: 25 mg via ORAL
  Filled 2016-02-05: qty 1

## 2016-02-05 NOTE — Progress Notes (Signed)
Pt c/o feeling dizzy, clammy and lightheaded while lying in bed. All VSS and CBG: 113. Pt instructed to call if needing to get up. Bed alarm in place. Text page sent to MD regarding patient's complaint. Pt currently resting comfortably in bed. Will continue to monitor patient closely.

## 2016-02-05 NOTE — Telephone Encounter (Signed)
Pt is currently in the hospital; states will be discharged today - wants to pick up rx today instead of coming back on the 20th.  Last wriiten 02/03/16. Lat appt 11/15/15 ; next appt scheduled 02/21/16. Drug screen 08/24/15

## 2016-02-05 NOTE — Telephone Encounter (Signed)
Needs TOC Discharge date 02/05/16 HFU 02/14/16

## 2016-02-05 NOTE — Progress Notes (Addendum)
PT Cancellation Note  Patient Details Name: [name] MRN: [id number] DOB: 11-Oct-1945   Cancelled Treatment:     Spoke with nsg regarding evaluation, at that time, patient was awaiting update from MD regarding dizziness and medical issues earlier in the day, per nsg PT held. New order then received for Vestibular evaluation, will have Vestibular PT follow in am.    Duncan Dull 02/05/2016, 5:04 PM Alben Deeds, [place] DPT  [phone]

## 2016-02-05 NOTE — Telephone Encounter (Signed)
Rx ready - pt called/informed. 

## 2016-02-05 NOTE — Consult Note (Addendum)
   [facility] Inpatient Consult   02/05/2016  YONAS HOVDE 08/01/1945 [id number]    Mr. Billips screened for [place] [facility] services for multiple hospitalizations. Went to bedside to discuss re-engaging with [place] [facility]. He asked writer to step out as he had to use the urinal. Will follow back up. Made inpatient RNCM aware.   Marthenia Rolling, MSN-Ed, RN,BSN [facility] Liaison [phone]

## 2016-02-05 NOTE — Consult Note (Signed)
   [facility] Inpatient Consult   02/05/2016  [name] 03-Jul-1945 [id number]   Went back to bedside to speak with [name] about restarting [facility] Care Management services. He replies by saying "yes yes please follow me". Will request to be reassigned for post hospital transition of care calls post hospital discharge. Made inpatient RNCM aware.   Marthenia Rolling, MSN-Ed, RN,BSN [facility] [place] Liaison [phone]

## 2016-02-05 NOTE — Telephone Encounter (Signed)
Checked Controlled substance database and reviewed chart, last filled on 01/09/16 appears appropriate.  Will give 1 month Rx to be filled 02/08/16

## 2016-02-05 NOTE — Progress Notes (Signed)
   Subjective: No acute events overnight. Patient had a large bowel movement yesterday. He says his abdominal pain has improved. He is ready to go home this afternoon. He had no additional acute complaints or concerns this morning.  Objective:  Vital signs in last 24 hours: Vitals:   02/04/16 2139 02/04/16 2356 02/05/16 0613 02/05/16 0756  BP: 95/68 96/65 90/66  98/77  Pulse:  82 70 61  Resp: 19 16 18  (!) 21  Temp:  97.6 F (36.4 C) 97.6 F (36.4 C) 98.2 F (36.8 C)  TempSrc:  Oral Oral Oral  SpO2:  98% 98% 98%  Weight:   150 lb (68 kg)   Height:       Physical Exam  Constitutional: He appears well-developed and well-nourished.  HENT:  Head: Normocephalic and atraumatic.  Cardiovascular: Normal rate.  Exam reveals no gallop and no friction rub.   No murmur heard. Respiratory: Effort normal and breath sounds normal. No respiratory distress. He has no wheezes.  GI: Soft. Bowel sounds are normal. He exhibits no distension. There is no tenderness.  Bowel sounds present, no abdominal or renal bruits auscultated  Musculoskeletal: He exhibits no edema.  Neurological: He is alert.     Assessment/Plan: Mr. Inglett is a 70 year old male with a past medical history of COPD, major depression, asbestos pleural disease, hypertension, congestive heart failure, A. fib and CKD stage III who presented with diarrhea most likely secondary to fecal impaction.  1. Stercoral Colitis, resolving  Patient refused soapsuds enema over the weekend. However, he did have a large bowel movement and says that his abdominal pain has improved. He states that he is feeling ready to go home. -- Continue home dronabinol 2.5 mg daily  2. Atrial flutter, stable Patient has a history of atrial fibrillation. On examination this morning the patient was no longer tachycardic. -- Continue Eliquis and aspirin -- We'll restart diltiazem extended release 100 mg. His home regimen is 120 mg of extended release diltiazem  however we will not start him at his home dose secondary to several episodes of hypotension while in the hospital. He will need further management and evaluation of this dosage in the outpatient setting.  3.  Urinary tract infection Urinalysis demonstrated moderate leukocytosis. Has a history of urinary tract infection on prior admission. Patient's culture demonstrating Escherichia coli that is resistant to ciprofloxacin and is sensitive to all other antibiotics tested.  -- Start trimethoprim sulfamethoxazole for a five-day course  4. Acute kidney injury, stable Patient with a creatinine of 2.16 on admission his baseline is around 1.5-2.5. This is most likely prerenal in the setting of dehydration. -- Most recent creatinine 1.74  5. Anxiety and depression -- Continue home bupropion 75 mg twice daily -- Continue home Remeron 30 mg daily  6. COPD -- Continue Spiriva -- Albuterol when necessary  7. DVT/PE prophylaxis -- Therapeutically anticoagulated for atrial flutter with Eliquis  Dispo: Anticipated discharge today.   Ophelia Shoulder, MD 02/05/2016, 10:40 AM Pager: [phone]

## 2016-02-05 NOTE — Telephone Encounter (Signed)
Needs pain med refill in hospital currently wants nurse to call.

## 2016-02-05 NOTE — Discharge Summary (Signed)
Name: [name] MRN: [id number] DOB: 03/04/1946 70 y.o. PCP: Zada Finders, MD  Date of Admission: 02/02/2016  8:49 AM Date of Discharge: 02/07/2016 Attending Physician: Carlyle Basques, MD  Discharge Diagnosis: 1. Stercoral colitis 2. Atrial flutter 3. Urinary tract infection   Discharge Medications:   Medication List    STOP taking these medications   multivitamin with minerals Tabs tablet     TAKE these medications   albuterol 108 (90 Base) MCG/ACT inhaler Commonly known as:  VENTOLIN HFA Inhale 2 puffs into the lungs every 6 (six) hours as needed for wheezing or shortness of breath.   apixaban 5 MG Tabs tablet Commonly known as:  ELIQUIS Take 1 tablet (5 mg total) by mouth 2 (two) times daily.   aspirin 81 MG chewable tablet Chew 1 tablet (81 mg total) by mouth daily.   buPROPion 75 MG tablet Commonly known as:  WELLBUTRIN Take 1 tablet (75 mg total) by mouth 2 (two) times daily.   cholecalciferol 1000 units tablet Commonly known as:  VITAMIN D Take 1 tablet (1,000 Units total) by mouth daily.   diltiazem 120 MG 24 hr capsule Commonly known as:  CARDIZEM CD Take 1 capsule (120 mg total) by mouth daily.   dronabinol 2.5 MG capsule Commonly known as:  MARINOL Take 1 capsule (2.5 mg total) by mouth 2 (two) times daily before lunch and supper.   furosemide 40 MG tablet Commonly known as:  LASIX Take 1 tablet (40 mg total) by mouth daily.   metoprolol succinate 100 MG 24 hr tablet Commonly known as:  TOPROL-XL Take 1 tablet (100 mg total) by mouth daily. Take with or immediately following a meal. What changed:  medication strength  how much to take  additional instructions   metoprolol succinate 50 MG 24 hr tablet Commonly known as:  TOPROL-XL Take 1 tablet (50 mg total) by mouth daily. Take with or immediately following a meal. What changed:  You were already taking a medication with the same name, and this prescription was added. Make sure you  understand how and when to take each.   mirtazapine 30 MG tablet Commonly known as:  REMERON Take 1 tablet (30 mg total) by mouth at bedtime.   omeprazole 40 MG capsule Commonly known as:  PRILOSEC Take 1 capsule (40 mg total) by mouth daily.   oxyCODONE-acetaminophen 10-325 MG tablet Commonly known as:  PERCOCET Take 1 tablet by mouth every 4 (four) hours as needed for pain. Do not take more than 5 tablets in 24 hours.   OXYGEN Inhale 3 L into the lungs as needed (shortness of breath).   rosuvastatin 20 MG tablet Commonly known as:  CRESTOR Take 1 tablet (20 mg total) by mouth daily at 6 PM. What changed:  when to take this   senna-docusate 8.6-50 MG tablet Commonly known as:  [employer] SENNA PLUS Take 1 tablet by mouth daily as needed for moderate constipation.   sulfamethoxazole-trimethoprim 800-160 MG tablet Commonly known as:  BACTRIM DS,SEPTRA DS Take 1 tablet by mouth every 12 (twelve) hours.   tiotropium 18 MCG inhalation capsule Commonly known as:  SPIRIVA Place 1 capsule (18 mcg total) into inhaler and inhale daily.     ASK your doctor about these medications   meclizine 25 MG tablet Commonly known as:  ANTIVERT Take 1 tablet (25 mg total) by mouth once. Ask about: Should I take this medication? Notes to patient:  When you visit your primary care physician, ask if  you should continue taking this medication.       Disposition and follow-up:   [name] was discharged from [facility] in Good condition.  At the hospital follow up visit please address:  1.  Please ensure the patient is doing well with the changes we have made in his medications. Additionally, please ensure the patient has finished his antibiotics for his urinary tract infection and that his symptoms are resolved.  2.  Labs / imaging needed at time of follow-up: None  3.  Pending labs/ test needing follow-up: None  Follow-up Appointments: Follow-up Information     [facility] .   Specialty:  Home Health Services Why:  Home Health RN, Social Worker, Physical Therapy and aide Contact information: [place] [address] [phone]        Inc[facility] - [facility] .   Why:  Community education officer information: [place] [id number] [place] .   Why:  [facility] Case Manager to call pt once home.  Care Connections Referral Made- Office to call patient for visit time.  Contact information: [place]        1. Follow-up with the [facility] medicine outpatient clinic on 02/08/2016 at 1:15 PM 2. Follow up with her cardiologist in the outpatient setting.  Hospital Course by problem list:  1. Stercoral Colitis The patient presented to the [facility] emergency department on 02/02/2016 with a chief complaint of constipation followed by abdominal pain, cramping and stool incontinence. Upon arrival to the emergency department the patient was afebrile and normotensive. His pulse was 49 with an irregularly irregular rhythm. He had an EKG which demonstrated atrial fibrillation/flutter and he was started on a diltiazem drip and continued on his home Eliquis and aspirin. Additionally, to evaluate the patients complaint of constipation followed by crampy abdominal pain and stool incontinence a CT abdomen and pelvis was obtained which demonstrated significant stool burden with possible rectal obstruction. In the emergency department the patient's labs were significant for creatinine of 2.16. He was admitted to the internal medicine teaching service for further workup and management of his stercoral colitis and atrial fibrillation. During the course of his hospitalization a soapsuds enema was ordered which the patient refused. Over the weekend he had one extremely large bowel movement with relief of his crampy abdominal pain. Following this  bowel movement his abdominal pain was stable and he did not require additional disimpaction. At the time of discharge he was tolerating good by mouth intake with relief of his pain. He was afebrile, hemodynamically stable and medically appropriate for discharge.  2. Atrial flutter Patient has a history of atrial fibrillation for which he was anticoagulated with Eliquis and aspirin. While inpatient he was found to be in atrial flutter. Upon his arrival to the emergency department the patient was put on a diltiazem drip. He was weaned off of his diltiazem drip while inpatient and was started on extended-release diltiazem 120 mg once daily. He had some mild hypotension secondary to this medication although his MAPs were greater than 60. At the time of discharge the patient will continue his home dose of 120 mg extended release diltiazem. We have reduced the dose of his metoprolol from 200 mg of the extended release formulation to 150 mg extended release formulation once daily. However, there is no single  pill formulation of 150 mg extended release metoprolol. Therefore, two separate prescriptions have been written for 100 mg of the extended release formulation and a second for the 50 mg of the extended release formulation. Before discharge we spoke with the patient's cardiologist regarding his medications for his atrial flutter and his hypotension. They're well familiar with the patient and recommend that he follow-up with cardiology in the outpatient setting. They suggested to keep him on his medication regimen as described above. Please instruct the patient to take both of these metoprolol prescriptions at the same time for a total dose of 150 mg of the extended release formulation once daily.  3. Urinary tract infection The patient has a history of prior urinary tract infections and BPH. During his hospitalization he complained of dysuria and urinalysis and culture were performed. Urinalysis was consistent  with urinary tract infection and culture demonstrated infection by the organism Escherichia coli. Sensitivities demonstrated resistance to ciprofloxacin with sensitivity to all other antibiotics checked. He's been started on trimethoprim-sulfamethoxazole twice a day for 5 days. Please ensure the patient has finished his medication for his urinary tract infection and that his symptoms are resolved.  4. Acute kidney injury Patient's creatinine of 2.16 on admission with a baseline of 1.5-2.5. The etiology for this acute kidney injury was most likely prerenal in etiology secondary to abdominal pain and decreased by mouth intake. His creatinine improved during admission with IV hydration. His most recent creatinine at time of discharge was 1.74 which consistent with his baseline.   Discharge Vitals:   BP 100/73 (BP Location: Right Arm)   Pulse 67   Temp 98.3 F (36.8 C) (Oral)   Resp 16   Ht 6\' 3"  (1.905 m)   Wt 148 lb 1.6 oz (67.2 kg)   SpO2 97%   BMI 18.51 kg/m   Pertinent Labs, Studies, and Procedures:  1. Urine culture-positive for Escherichia coli, resistant to ciprofloxacin, sensitive to all other antibiotics checked 2. CT abdomen and pelvis with contrast-moderate rectal stool burden suggesting rectal impaction 3. Diagnostic chest x-ray- chronic interstitial lung disease/fibrosis involving the left lung, no evidence of acute cardiopulmonary disease  Discharge Instructions: Discharge Instructions    AMB Referral to [place] Management    Complete by:  As directed    Please assign to [facility] for post transition of care. Has had multiple hospital admissions. Active consent on file. Recently active with [facility] Care Management program. Currently at [facility]. Thanks. Please call with questions. Marthenia Rolling, [place], [facility] [phone]   Reason for consult:  Please assign to [facility] RNCM   Expected date of contact:  1-3 days (reserved for  hospital discharges)   Diet - low sodium heart healthy    Complete by:  As directed    Diet - low sodium heart healthy    Complete by:  As directed    Diet - low sodium heart healthy    Complete by:  As directed    Discharge instructions    Complete by:  As directed    Please continue to take your medication as prescribed.   Discharge instructions    Complete by:  As directed    We have made changes to her medications. Please take your diltiazem once daily. We have changed you metoprolol dose. You have to separate prescriptions for metoprolol. One prescription is for 100 mg and the second prescription is for 50 mg. Please take both pills at the same time  for a total of 150 mg once daily.  To recap, you have to separate prescription for metoprolol. One prescription is for 100 mg and the second prescription is for 50 mg. Please take both pills at the same time for a total of 150 mg once daily.  Please continue on the rest of your medications as prescribed.   Discharge instructions    Complete by:  As directed    We have made two changes to your medications. Please take your diltiazem once daily. We have changed  your metoprolol dose. You have two prescriptions for metoprolol. One prescription is for 100 mg and the second prescription is for 50 mg. Please take both pills at the same time for a total of 150 mg once daily.  To recap, you have two separate prescriptions for metoprolol. One prescription is for 100 mg and the second prescription is for 50 mg. Please take both pills at the same time for a total of 150 mg once daily.  Please continue to take the rest of your medications as prescribed.   Increase activity slowly    Complete by:  As directed    Increase activity slowly    Complete by:  As directed    Increase activity slowly    Complete by:  As directed       Signed: Ophelia Shoulder, MD 02/07/2016, 1:22 PM   Pager: [phone]

## 2016-02-05 NOTE — Care Management Important Message (Signed)
Important Message  Patient Details  Name: [name] MRN: [id number] Date of Birth: [birth year]   Medicare Important Message Given:  Yes    [name] May 02/05/2016, 9:51 AM

## 2016-02-06 DIAGNOSIS — I952 Hypotension due to drugs: Secondary | ICD-10-CM

## 2016-02-06 MED ORDER — METOPROLOL SUCCINATE ER 50 MG PO TB24: 50.0000 mg | ORAL_TABLET | Freq: Every day | ORAL | Status: DC

## 2016-02-06 MED ORDER — METOPROLOL SUCCINATE ER 50 MG PO TB24: 50.0000 mg | ORAL_TABLET | Freq: Every day | ORAL | 3 refills | Status: DC

## 2016-02-06 MED ORDER — MECLIZINE HCL 25 MG PO TABS
25.0000 mg | ORAL_TABLET | Freq: Once | ORAL | Status: AC
  Administered 2016-02-06: 25 mg via ORAL
  Filled 2016-02-06: qty 1

## 2016-02-06 MED ORDER — METOPROLOL SUCCINATE ER 100 MG PO TB24
150.0000 mg | ORAL_TABLET | Freq: Every day | ORAL | Status: DC
  Administered 2016-02-07: 150 mg via ORAL
  Filled 2016-02-06: qty 2

## 2016-02-06 MED ORDER — DILTIAZEM HCL ER COATED BEADS 120 MG PO CP24: 120.0000 mg | ORAL_CAPSULE | Freq: Every day | ORAL | 3 refills | Status: DC

## 2016-02-06 MED ORDER — DILTIAZEM HCL ER COATED BEADS 120 MG PO CP24
120.0000 mg | ORAL_CAPSULE | Freq: Every day | ORAL | Status: DC
  Administered 2016-02-07: 120 mg via ORAL
  Filled 2016-02-06: qty 1

## 2016-02-06 MED ORDER — DILTIAZEM HCL 30 MG PO TABS
30.0000 mg | ORAL_TABLET | Freq: Four times a day (QID) | ORAL | Status: AC
  Administered 2016-02-06 (×2): 30 mg via ORAL
  Filled 2016-02-06 (×2): qty 1

## 2016-02-06 MED ORDER — METOPROLOL SUCCINATE ER 100 MG PO TB24: 100.0000 mg | ORAL_TABLET | Freq: Every day | ORAL | 3 refills | Status: DC

## 2016-02-06 MED ORDER — MECLIZINE HCL 25 MG PO TABS: 25.0000 mg | ORAL_TABLET | Freq: Once | ORAL | 0 refills | Status: AC

## 2016-02-06 NOTE — Progress Notes (Signed)
Subjective: No acute events overnight. Patient states that he did not get much sleep secondary to his chronic hip and shoulder pain. He endorses mild nausea with slight dizziness this morning. He thinks that he will be able to go home this morning from a symptom standpoint. He has no additional acute complaints or concerns this morning.  Objective:  Vital signs in last 24 hours: Vitals:   02/05/16 2137 02/06/16 0200 02/06/16 0500 02/06/16 0837  BP: 99/73 93/69 101/80 108/83  Pulse:  63 83 76  Resp: 17 16 20    Temp:  98.2 F (36.8 C) 98.1 F (36.7 C) 98.4 F (36.9 C)  TempSrc:    Oral  SpO2:  100% 97% 98%  Weight:   148 lb 1.6 oz (67.2 kg)   Height:       Physical Exam  Constitutional: He is oriented to person, place, and time. He appears well-developed and well-nourished.  Resting comfortably asleep in no acute distress  HENT:  Head: Atraumatic.  Cardiovascular: Normal rate.  Exam reveals no gallop and no friction rub.   No murmur heard. Irregular rhythm  Respiratory: Effort normal and breath sounds normal. No respiratory distress. He has no wheezes.  GI: Soft. Bowel sounds are normal. He exhibits no distension. There is no tenderness.  Musculoskeletal: He exhibits no edema.  Neurological: He is alert and oriented to person, place, and time.     Assessment/Plan: Mr. Kyle is a 70 year old male with a past medical history of COPD, major depression, asbestospleural disease, hypertension, congestive heart failure, A. fib and CKD stage III who presented with diarrhea most likely secondary to fecal impaction.  1. Stercoral Colitis, resolving  Patient refused soapsuds enema over the weekend. However, he did have a large bowel movement and says that his abdominal pain has improved. He states that he is feeling ready to go home. -- Continue home dronabinol 2.5 mg daily  2. Atrial flutter, stable Patient has a history of atrial fibrillation. On examination this morning the  patient was no longer tachycardic. -- Continue Eliquis and aspirin -- We will start diltiazem 120 mg extended release at time of discharge and decreased his metoprolol extended release to 150 mg once daily. We have decreased his metoprolol by 50 mg secondary to hypotension.  3.  Urinary tract infection Urinalysis demonstrated moderate leukocytosis. Has a history of urinary tract infection on prior admission. Patient's culture demonstrating Escherichia coli that is resistant to ciprofloxacin and is sensitive to all other antibiotics tested.  -- Continue trimethoprim sulfamethoxazole for a five-day course  4. Intermittent vertigo Complains of intermittent episodes of vertigo associated with nausea. He has had these episodes in the past. He had one severe episode yesterday afternoon when he became extremely nauseated. He says that he was feeling better this morning. The differential diagnosis for this represents vertigo, BPPV, Mnire's disease or other vestibular pathology. -- Physical therapy and vestibular rehabilitation ordered -- Follow-up with outpatient physicians for further management  5. Acute kidney injury, stable Patient with a creatinine of 2.16 on admission his baseline is around 1.5-2.5. This is most likely prerenal in the setting of dehydration. -- Most recent creatinine 1.74  6. Anxiety and depression -- Continue home bupropion 75 mg twice daily -- Discontinue Remeron as patient states he does not take this at home  7. COPD -- Continue Spiriva -- Albuterol when necessary  8. DVT/PE prophylaxis -- Therapeutically anticoagulated for atrial flutter with Eliquis  Dispo: Anticipated discharge today.   Ophelia Shoulder,  MD 02/06/2016, 8:51 AM Pager: [phone]

## 2016-02-06 NOTE — Progress Notes (Addendum)
Patient contacted Judithann Graves to complete Medicare appeal for discharge. Will continue to follow for decision on appeal.  Jonnie Finner RN CCM Case Mgmt phone [phone]

## 2016-02-06 NOTE — NC FL2 (Signed)
[place] LEVEL OF CARE SCREENING TOOL     IDENTIFICATION  Patient Name: [name] Birthdate: 05/27/1945 Sex: male Admission Date (Current Location): 02/02/2016  [facility] and [place] Number:  Herbalist and Address:  [facility] [place] [address], [place], [place] [address]      Provider Number: [id number]  Attending Physician Name and Address:  Carlyle Basques, MD  Relative Name and Phone Number:       Current Level of Care: Hospital Recommended Level of Care: [place] Prior Approval Number:    Date Approved/Denied:   PASRR Number: [id number]  Discharge Plan: SNF    Current Diagnoses: Patient Active Problem List   Diagnosis Date Noted  . Benign paroxysmal positional vertigo   . A-fib ([place]) 02/02/2016  . Weakness   . Major depression, recurrent, chronic ([place])   . Acute cystitis without hematuria   . Hyperkalemia 12/15/2015  . Hydrocele of testis   . Asbestosis ([place])   . CKD (chronic kidney disease)   . Coagulopathy ([place])   . Long term current use of opiate analgesic 06/27/2015  . Decreased appetite 06/19/2015  . Neuropathic pain of both feet ([place]) 05/03/2015  . Chronic pain 12/13/2014  . AKI (acute kidney injury) ([place])   . Longstanding persistent atrial fibrillation ([place]) 10/11/2014  . TIA (transient ischemic attack) 10/11/2014  . Healthcare maintenance 01/25/2014  . BPH (benign prostatic hyperplasia) 01/20/2014  . PVD (peripheral vascular disease) ([place]) 12/10/2013  . Chronic combined systolic and diastolic congestive heart failure ([place]) 06/20/2010  . Long-term (current) use of anticoagulants 06/05/2010  . H/O Arterial embolism of leg  06/05/2010  . Depression 01/19/2010  . Chronic kidney disease (CKD), stage III (moderate) 11/18/2009  . History of hyperthyroidism 03/13/2009  . Bilateral hip pain 03/23/2007  . OSTEOARTHRITIS 09/05/2006  . G E R D 09/04/2006  . COPD (chronic obstructive  pulmonary disease) ([place]) 08/28/2006  . ERECTILE DYSFUNCTION 03/01/2006  . History of tobacco abuse 03/01/2006  . Essential hypertension 03/01/2006  . Cardiac pacemaker in situ 03/01/2006    Orientation RESPIRATION BLADDER Height & Weight     Self, Time, Situation, Place  O2 (PRN currently) Continent Weight: 148 lb 1.6 oz (67.2 kg) Height:  6\' 3"  (190.5 cm)  BEHAVIORAL SYMPTOMS/MOOD NEUROLOGICAL BOWEL NUTRITION STATUS      Continent Diet (Regular / Thin Liquid)  AMBULATORY STATUS COMMUNICATION OF NEEDS Skin   Limited Assist Verbally Normal                       Personal Care Assistance Level of Assistance  Bathing, Feeding, Dressing Bathing Assistance: Limited assistance Feeding assistance: Independent Dressing Assistance: Limited assistance     Functional Limitations Info  Sight, Hearing, Speech Sight Info: Adequate Hearing Info: Adequate Speech Info: Adequate    SPECIAL CARE FACTORS FREQUENCY  PT (By licensed PT), OT (By licensed OT)     PT Frequency: 3 OT Frequency: 3            Contractures Contractures Info: Not present    Additional Factors Info  Code Status, Allergies Code Status Info: Full Code Allergies Info: Amiodarone, Penicillins     Isolation Precautions Info: MRSA + in sputum and by pcr 07/29/15     Current Medications (02/06/2016):  This is the current hospital active medication list Current Facility-Administered Medications  Medication Dose Route Frequency Provider Last Rate Last Dose  . acetaminophen (TYLENOL) tablet 650 mg  650 mg Oral Q6H PRN Ledell Noss, MD   650 mg at 02/02/16 1856  . apixaban (ELIQUIS) tablet 5 mg  5 mg Oral BID Norman Herrlich, MD   5 mg at 02/06/16 [id number]  . aspirin chewable tablet 81 mg  81 mg Oral Daily Norman Herrlich, MD   81 mg at 02/06/16 [id number]  . buPROPion [facility]) tablet 75 mg  75 mg Oral BID Norman Herrlich, MD   75 mg at 02/06/16 0924  . cholecalciferol (VITAMIN D) tablet 1,000 Units  1,000 Units Oral Daily  Norman Herrlich, MD   1,000 Units at 02/06/16 [phone]  . [START ON 02/07/2016] diltiazem (CARDIZEM CD) 24 hr capsule 120 mg  120 mg Oral Daily Ophelia Shoulder, MD      . diltiazem (CARDIZEM) 100 mg in dextrose 5% 187mL (1 mg/mL) infusion  5-15 mg/hr Intravenous Continuous Ivin Booty, MD   Stopped at 02/04/16 [phone]  . diltiazem (CARDIZEM) tablet 30 mg  30 mg Oral Q6H Ophelia Shoulder, MD      . dronabinol (MARINOL) capsule 2.5 mg  2.5 mg Oral BID AC Norman Herrlich, MD   2.5 mg at 02/06/16 1233  . feeding supplement (BOOST / RESOURCE BREEZE) liquid 1 Container  1 Container Oral TID BM Lucious Groves, DO   1 Container at 02/06/16 1438  . Influenza vac split quadrivalent PF (FLUARIX) injection 0.5 mL  0.5 mL Intramuscular Prior to discharge Lucious Groves, DO      . meclizine (ANTIVERT) tablet 25 mg  25 mg Oral Once Ophelia Shoulder, MD      . Derrill Memo ON 02/07/2016] metoprolol succinate (TOPROL-XL) 24 hr tablet 150 mg  150 mg Oral Daily Ophelia Shoulder, MD      . oxyCODONE-acetaminophen (PERCOCET/ROXICET) 5-325 MG per tablet 1 tablet  1 tablet Oral Q4H PRN Lucious Groves, DO   1 tablet at 02/06/16 [id number]   And  . oxyCODONE (Oxy IR/ROXICODONE) immediate release tablet 5 mg  5 mg Oral Q4H PRN Lucious Groves, DO   5 mg at 02/05/16 1725  . pantoprazole (PROTONIX) EC tablet 40 mg  40 mg Oral Daily Ledell Noss, MD   40 mg at 02/06/16 0924  . sulfamethoxazole-trimethoprim (BACTRIM DS,SEPTRA DS) 800-160 MG per tablet 1 tablet  1 tablet Oral Q12H Ophelia Shoulder, MD   1 tablet at 02/06/16 0925  . tiotropium (SPIRIVA) inhalation capsule 18 mcg  18 mcg Inhalation Daily Norman Herrlich, MD   18 mcg at 02/05/16 [id number]     Discharge Medications: Please see discharge summary for a list of discharge medications.  Relevant Imaging Results:  Relevant Lab Results:   Additional Information SSN [id number]   Barbette Or, [place]

## 2016-02-06 NOTE — Care Management Note (Addendum)
Case Management Note  Patient Details  Name: [name] MRN: [id number] Date of Birth: April 09, 1946  Subjective/Objective:       Colitis, UTI, AKI             Action/Plan: Discharge Planning: AVS reviewed:  NCM spoke to pt at bedside. Offered choice for [facility] Pt states he had [facility] in the past. [facility] with new referral. Spoke to intake coordinator and pt was dc from [facility] in 12/2015. They will accept referral [facility] agency has assess to EPIC) . Pt participated in [place] program in the past were [facility] RN will come out everyday for one week for pt at risk for readmission. Contacted attending for [facility] orders.   Pt states he was suppose to get a neb machine at his last visit. Contacted attending for new order. Contacted [facility] for delivery of neb machine. [facility] delivered to room. NCM explained to pt to pt about device. Placed in his belongings back to take home. States he has oxygen prn, wheelchair, rollator, RW, bedside commode and medical alert system. His dtr, [name] assist him at home as needed.   PCP- Zada Finders MD  [phone] PT recommending SNF. CSW referral for SNF placement. Pt does not feel like he is ready to dc. Provided pt with Medicare IM.    Expected Discharge Date:  02/06/2016               Expected Discharge Plan:  [place]  In-House Referral:  NA  Discharge planning Services  CM Consult  Post Acute Care Choice:  Home Health Choice offered to:  Patient  DME Arranged:  Nebulizer machine DME Agency:  [place]:  PT, RN, Social Work, Nurse's Aide [place] Agency:  [place]  Status of Service:  Completed, signed off  If discussed at [employer], dates discussed:    Additional Comments:  Erenest Rasher, RN 02/06/2016, 2:49 PM

## 2016-02-06 NOTE — Progress Notes (Addendum)
Pt requesting narcotic pain medication. BP was 99/73 and pt was symptomatic:complaints of dizziness not associated with changes in position.I explained that pain med could further decrease his BP. I helped him reposition and provided extra pillows as nonpharmacologic pain interventions. I re-checked his BP in one hour and it was 96/70, so I still did not give his PRN oxycodone. I offered tylenol instead. He said he was comfortable at that time. Will continue to monitor his BP and pain level.  Andreas Blower, RN

## 2016-02-06 NOTE — Evaluation (Signed)
Physical Therapy Evaluation Patient Details Name: [name] MRN: [id number] DOB: Feb 10, 1946 Today's Date: 02/06/2016   History of Present Illness  70 y.o. male admitted to [facility] on 02/02/16 for diarrhea and abdominal pain.  Dx with stercoral colitis, a-flutter, and UTI.  Pt with significant PMhx of tachy-brady syndrome s/p pacemaker, STEMI, spondylosis, home O2, HTN, DVT, COPD, CKD, CHF, and bil THA.  Clinical Impression  Pt is unsteady on his feet reporting dizziness with transitions.  Pt has no significant findings with vestibular testing (possible hypofunction- difficulty with horizontal gaze stability testing/exercises).  He is at high fall risk and has limited gait distance.  He would benefit from SNF for rehab with further vestibular testing.  Orthostatics were taken and were negative (see below).   PT to follow acutely for deficits listed below.       Follow Up Recommendations SNF    Equipment Recommendations  None recommended by PT    Recommendations for Other Services   NA    Precautions / Restrictions Precautions Precautions: Fall      Mobility  Bed Mobility Overal bed mobility: Needs Assistance Bed Mobility: Supine to Sit;Sit to Supine     Supine to sit: Min guard;HOB elevated Sit to supine: Min guard   General bed mobility comments: Min guard assist for safety and balance during transitions.  Pt reported dizziness once sitting EOB.    Transfers Overall transfer level: Needs assistance Equipment used: Rolling walker (2 wheeled) Transfers: Sit to/from Stand Sit to Stand: Min assist;From elevated surface         General transfer comment: Min assist to support trunk during transitions, attempted from normal height bed, but pt unable to stand from lower height.    Ambulation/Gait Ambulation/Gait assistance: Min assist Ambulation Distance (Feet): 40 Feet Assistive device: Rolling walker (2 wheeled) Gait Pattern/deviations: Step-through  pattern;Shuffle;Staggering left;Staggering right Gait velocity: decreased Gait velocity interpretation: Below normal speed for age/gender General Gait Details: Pt with staggering gait pattern even with RW use.  Needs assist for balance in standing and reports periodic dizziness.           Balance Overall balance assessment: Needs assistance Sitting-balance support: Feet supported;Bilateral upper extremity supported Sitting balance-Leahy Scale: Fair     Standing balance support: Bilateral upper extremity supported Standing balance-Leahy Scale: Poor       02/06/16 1604  Vestibular Assessment  General Observation right eye seems mildly abducted, reports some rigning in his hears, (+) hearling loss, (-) head trauma, (+) recent antibiotic use, no recent URI.   Symptom Behavior  Type of Dizziness Spinning  Frequency of Dizziness minutes  Duration of Dizziness as long as he is upright  Aggravating Factors Supine to sit;Sit to stand  Relieving Factors Closing eyes;Rest;Lying supine  Occulomotor Exam  Occulomotor Alignment Abnormal  Spontaneous Absent  Gaze-induced Absent  Smooth Pursuits Intact  Vestibulo-Occular Reflex  VOR 1 Head Only (x 1 viewing) symptomatic horizontal, not vertical  Positional Testing  Dix-Hallpike (did not show symptoms during transitions from sit to supine )  Horizontal Canal Testing Horizontal Canal Right;Horizontal Canal Left  Horizontal Canal Right  Horizontal Canal Right Duration 0  Horizontal Canal Right Symptoms Normal  Horizontal Canal Left  Horizontal Canal Left Duration 0  Horizontal Canal Left Symptoms Normal  Cognition  Cognition Orientation Level Oriented x 4                              Pertinent  Vitals/Pain Pain Assessment: 0-10 Pain Score: 8  Pain Location: head Pain Descriptors / Indicators: Aching Pain Intervention(s): Limited activity within patient's tolerance;Monitored during session;Repositioned    02/06/16 1455   Vital Signs  Patient Position (if appropriate) Orthostatic Vitals  Orthostatic Lying   BP- Lying 100/75  Pulse- Lying 87     02/06/16 1501  Orthostatic Sitting  BP- Sitting 100/85  Pulse- Sitting 94     02/06/16 1515  Orthostatic Standing at 0 minutes  BP- Standing at 0 minutes 96/80  Pulse- Standing at 0 minutes 98  Orthostatic Standing at 3 minutes  BP- Standing at 3 minutes (unable)     Home Living Family/patient expects to be discharged to:: Private residence Living Arrangements: Alone Available Help at Discharge: Family;Available PRN/intermittently Type of Home: Apartment Home Access: Elevator     Home Layout: One level Home Equipment: Walker - 4 wheels;Walker - 2 wheels;Cane - single point;Bedside commode;Wheelchair - Education administrator (comment) (home O2 PRN)      Prior Function Level of Independence: Independent with assistive device(s)         Comments: uses cane     Hand Dominance   Dominant Hand: Right    Extremity/Trunk Assessment   Upper Extremity Assessment: Defer to OT evaluation           Lower Extremity Assessment: Generalized weakness (h/o bil THA)      Cervical / Trunk Assessment: Normal  Communication   Communication: HOH  Cognition Arousal/Alertness: Awake/alert Behavior During Therapy: WFL for tasks assessed/performed Overall Cognitive Status: Within Functional Limits for tasks assessed                      General Comments General comments (skin integrity, edema, etc.): Orthostatic vitals taken and were negative.     Exercises Other Exercises Other Exercises: x1 gaze stability exercises given for horizontal with a goal to preform 3-5 times per day up to moderate intensity symptoms and with a goal time of doing it for a minute.  Handout given.    Assessment/Plan    PT Assessment Patient needs continued PT services  PT Problem List Decreased strength;Decreased activity tolerance;Decreased balance;Decreased knowledge  of use of DME;Decreased mobility;Decreased coordination;Impaired sensation;Pain;Other (comment) (dizziness)          PT Treatment Interventions DME instruction;Gait training;Stair training;Functional mobility training;Therapeutic activities;Therapeutic exercise;Balance training;Neuromuscular re-education;Patient/family education    PT Goals (Current goals can be found in the Care Plan section)  Acute Rehab PT Goals Patient Stated Goal: to figure out why he is dizzy and stay somewhere where he can be helped.  PT Goal Formulation: With patient Time For Goal Achievement: 02/20/16 Potential to Achieve Goals: Good    Frequency Min 3X/week   Barriers to discharge Decreased caregiver support lives alone, has limited assist from his daughter       End of Session Equipment Utilized During Treatment: Gait belt Activity Tolerance: Patient limited by pain;Patient limited by fatigue Patient left: in chair;with call bell/phone within reach;with chair alarm set           Time: [phone] PT Time Calculation (min) (ACUTE ONLY): 61 min   Charges:   PT Evaluation $PT Eval Moderate Complexity: 1 Procedure PT Treatments $Gait Training: 8-22 mins $Therapeutic Activity: 23-37 mins        Senai Ramnath B. [place], [place], DPT [phone]   02/06/2016, 4:06 PM

## 2016-02-06 NOTE — Care Management Important Message (Signed)
Important Message  Patient Details  Name: [name] MRN: [id number] Date of Birth: 06/07/1945   Medicare Important Message Given:  Yes    Erenest Rasher, RN 02/06/2016, 4:26 PM

## 2016-02-07 DIAGNOSIS — I4892 Unspecified atrial flutter: Secondary | ICD-10-CM

## 2016-02-07 MED ORDER — MECLIZINE HCL 25 MG PO TABS
25.0000 mg | ORAL_TABLET | Freq: Once | ORAL | Status: AC
  Administered 2016-02-07: 25 mg via ORAL
  Filled 2016-02-07: qty 1

## 2016-02-07 NOTE — Evaluation (Signed)
Occupational Therapy Evaluation Patient Details Name: [name] MRN: [id number] DOB: [birth year] Today's Date: 02/07/2016    History of Present Illness 70 y.o. male admitted to [facility] on 02/02/16 for diarrhea and abdominal pain.  Dx with stercoral colitis, a-flutter, and UTI.  Pt with significant PMhx of tachy-brady syndrome s/p pacemaker, STEMI, spondylosis, home O2, HTN, DVT, COPD, CKD, CHF, and bil THA.   Clinical Impression   Pt with decline in function and safety with ADLs and ADL mobility with decreased strength, balance an endurance. Pt SOB with minimal exertion and required increased time and multiple rest breaks to complete tasks. Pt would benefit from acute OT services to increase level of function and safety    Follow Up Recommendations  Supervision - Intermittent, pt would benefit form short term SNF, however pt refusing SNF   Equipment Recommendations    none   Recommendations for Other Services       Precautions / Restrictions Precautions Precautions: Fall Restrictions Weight Bearing Restrictions: No      Mobility Bed Mobility Overal bed mobility: Needs Assistance Bed Mobility: Supine to Sit;Sit to Supine     Supine to sit: Supervision Sit to supine: Supervision   General bed mobility comments:   Pt reported dizziness once sitting EOB.    Transfers Overall transfer level: Needs assistance Equipment used: Rolling walker (2 wheeled) Transfers: Sit to/from Stand Sit to Stand: Min guard              Balance     Sitting balance-Leahy Scale: Fair       Standing balance-Leahy Scale: Poor                              ADL Overall ADL's : Needs assistance/impaired     Grooming: Wash/dry hands;Wash/dry face;Min guard;Standing   Upper Body Bathing: Supervision/ safety;Set up;Sitting   Lower Body Bathing: Min guard;Sit to/from stand   Upper Body Dressing : Supervision/safety;Set up;Sitting   Lower Body Dressing: Min guard;Sit  to/from stand   Toilet Transfer: Supervision/safety;BSC;Min guard;RW;Grab bars;Comfort height toilet   Toileting- Clothing Manipulation and Hygiene: Supervision/safety;Sit to/from stand   Tub/ Banker: 3 in 1   Functional mobility during ADLs: Supervision/safety;Min guard General ADL Comments: pt SOB with minimal exertion, increased time to complete tasks with multiple rest breaks     Vision  no change from baseline, wears galsses              Pertinent Vitals/Pain Pain Assessment: No/denies pain     Hand Dominance Right   Extremity/Trunk Assessment Upper Extremity Assessment Upper Extremity Assessment: Generalized weakness   Lower Extremity Assessment Lower Extremity Assessment: Defer to PT evaluation   Cervical / Trunk Assessment Cervical / Trunk Assessment: Normal   Communication Communication Communication: HOH   Cognition Arousal/Alertness: Awake/alert Behavior During Therapy: WFL for tasks assessed/performed Overall Cognitive Status: Within Functional Limits for tasks assessed                     General Comments   pt pleasant and cooperative                 Home Living Family/patient expects to be discharged to:: Private residence Living Arrangements: Alone Available Help at Discharge: Family;Available PRN/intermittently Type of Home: Apartment Home Access: Elevator     Home Layout: One level     Bathroom Shower/Tub: Occupational psychologist: Standard  Home Equipment: [place] - 4 wheels;Walker - 2 wheels;Cane - single point;Bedside commode;Wheelchair - manual;Other (comment)   Additional Comments: Recently move to apartment alone.        Prior Functioning/Environment Level of Independence: Independent with assistive device(s)        Comments: uses cane        OT Problem List: Decreased strength;Impaired balance (sitting and/or standing);Cardiopulmonary status limiting activity;Decreased activity  tolerance;Decreased knowledge of use of DME or AE   OT Treatment/Interventions: Self-care/ADL training;DME and/or AE instruction;Therapeutic activities;Patient/family education;Energy conservation    OT Goals(Current goals can be found in the care plan section) Acute Rehab OT Goals Patient Stated Goal: go home. no SNF OT Goal Formulation: With patient Time For Goal Achievement: 02/14/16 Potential to Achieve Goals: Good ADL Goals Pt Will Perform Grooming: with supervision;with set-up;standing Pt Will Perform Upper Body Bathing: with set-up;sitting Pt Will Perform Lower Body Bathing: with supervision;with set-up;sitting/lateral leans;sit to/from stand Pt Will Perform Upper Body Dressing: with set-up;sitting Pt Will Perform Lower Body Dressing: with supervision;with set-up;sit to/from stand;sitting/lateral leans Pt Will Transfer to Toilet: with supervision;with modified independence;ambulating;regular height toilet;grab bars Pt Will Perform Toileting - Clothing Manipulation and hygiene: with modified independence;sit to/from stand Pt Will Perform Tub/Shower Transfer: with supervision;with modified independence;shower seat;3 in 1;ambulating  OT Frequency: Min 2X/week   Barriers to D/C:    pt lives at home alone. Recommend SNF, however pt declines and wants HH                     End of Session Equipment Utilized During Treatment: Rolling walker;Other (comment) (3 in 1)  Activity Tolerance: Patient limited by fatigue;Other (comment) (SOB, multilpe rest breaks required) Patient left: in bed (sitting EOB)   Time: [id number] OT Time Calculation (min): 23 min Charges:  OT General Charges $OT Visit: 1 Procedure OT Evaluation $OT Eval Moderate Complexity: 1 Procedure OT Treatments $Therapeutic Activity: 8-22 mins G-Codes:    Britt Bottom 02/07/2016, 2:43 PM

## 2016-02-07 NOTE — Care Management (Addendum)
  Case Management Note Initial Note Started by Jonnie Finner.  Patient Details  Name: [name] MRN: [id number] Date of Birth: January 17, 1946  Subjective/Objective:       Colitis, UTI, AKI             Action/Plan: Discharge Planning: AVS reviewed:  NCM spoke to pt at bedside. Offered choice for [facility]. Pt states he had [facility] in the past. [facility] with new referral. Spoke to intake coordinator and pt was dc from [facility] in 12/2015. They will accept referral [facility] agency has assess to EPIC) . Pt participated in [place] program in the past were [facility] RN will come out everyday for one week for pt at risk for readmission. Contacted attending for [facility] orders.   Pt states he was suppose to get a neb machine at his last visit. Contacted attending for new order. Contacted [facility] for delivery of neb machine. [facility] delivered to room. NCM explained to pt to pt about device. Placed in his belongings back to take home. States he has oxygen prn, wheelchair, rollator, RW, bedside commode and medical alert system. His dtr, [place] assist him at home as needed.   PCP- Zada Finders MD  [phone] PT recommending SNF. CSW referral for SNF placement. Pt does not feel like he is ready to dc. Provided pt with Medicare IM.    Expected Discharge Date:  02/06/2016                   Expected Discharge Plan:  [place]  In-House Referral:  NA  Discharge planning Services  CM Consult  Post Acute Care Choice:  Home Health Choice offered to:  Patient  DME Arranged:  Nebulizer machine DME Agency:  [place]:  PT, RN, Social Work, Nurse's Aide [place] Agency:  [place]  Status of Service:  Completed, signed off  If discussed at [employer], dates discussed:    Additional Comments: 02-07-16 [address] Jacqlyn Krauss, RN,BSN [phone] CM did speak with pt in regards to disposition needs. Pt is refusing SNF at  this time. Pt had made an appeal to [employer] on 02-06-16. Pt to call Medicare [employer] today in regards to canceling appeal due to patient is ready to d/c. Pt states he would like to use [facility] for [facility]. CM did call [name] to make sure referral was received. Pt will need assistance with Meals on Wheels. CM did make [name] aware and [place] is aware. Pt will be active with [facility] for community resources and CM did make referral with [facility] for [facility]. Nebulizer delivered to room from [facility]. Per pt his godson will pick him up today and pt states  daughter is not working and will be able to check on him. CM did ask to call daughter, however pt stated he would take care of that. No further needs from CM at this time.

## 2016-02-07 NOTE — Discharge Instructions (Signed)
Atrial Fibrillation Atrial fibrillation is a type of heartbeat that is irregular or fast (rapid). If you have this condition, your heart keeps quivering in a weird (chaotic) way. This condition can make it so your heart cannot pump blood normally. Having this condition gives a person more risk for stroke, heart failure, and other heart problems. There are different types of atrial fibrillation. Talk with your doctor to learn about the type that you have. HOME CARE  Take over-the-counter and prescription medicines only as told by your doctor.  If your doctor prescribed a blood-thinning medicine, take it exactly as told. Taking too much of it can cause bleeding. If you do not take enough of it, you will not have the protection that you need against stroke and other problems.  Do not use any tobacco products. These include cigarettes, chewing tobacco, and e-cigarettes. If you need help quitting, ask your doctor.  If you have apnea (obstructive sleep apnea), manage it as told by your doctor.  Do not drink alcohol.  Do not drink beverages that have caffeine. These include coffee, soda, and tea.  Maintain a healthy weight. Do not use diet pills unless your doctor says they are safe for you. Diet pills may make heart problems worse.  Follow diet instructions as told by your doctor.  Exercise regularly as told by your doctor.  Keep all follow-up visits as told by your doctor. This is important. GET HELP IF:  You notice a change in the speed, rhythm, or strength of your heartbeat.  You are taking a blood-thinning medicine and you notice more bruising.  You get tired more easily when you move or exercise. GET HELP RIGHT AWAY IF:  You have pain in your chest or your belly (abdomen).  You have sweating or weakness.  You feel sick to your stomach (nauseous).  You notice blood in your throw up (vomit), poop (stool), or pee (urine).  You are short of breath.  You suddenly have swollen  feet and ankles.  You feel dizzy.  Your suddenly get weak or numb in your face, arms, or legs, especially if it happens on one side of your body.  You have trouble talking, trouble understanding, or both.  Your face or your eyelid droops on one side. These symptoms may be an emergency. Do not wait to see if the symptoms will go away. Get medical help right away. Call your local emergency services (911 in the [place] Do not drive yourself to the hospital.   This information is not intended to replace advice given to you by your health care provider. Make sure you discuss any questions you have with your health care provider.   Document Released: 02/13/2008 Document Revised: 01/25/2015 Document Reviewed: 08/31/2014 Elsevier Interactive Patient Education 2016 [employer]. Apixaban oral tablets What is this medicine? APIXABAN (a PIX a ban) is an anticoagulant (blood thinner). It is used to lower the chance of stroke in people with a medical condition called atrial fibrillation. It is also used to treat or prevent blood clots in the lungs or in the veins. This medicine may be used for other purposes; ask your health care provider or pharmacist if you have questions. What should I tell my health care provider before I take this medicine? They need to know if you have any of these conditions: -bleeding disorders -bleeding in the brain -blood in your stools (black or tarry stools) or if you have blood in your vomit -history of stomach bleeding -kidney  disease -liver disease -mechanical heart valve -an unusual or allergic reaction to apixaban, other medicines, foods, dyes, or preservatives -pregnant or trying to get pregnant -breast-feeding How should I use this medicine? Take this medicine by mouth with a glass of water. Follow the directions on the prescription label. You can take it with or without food. If it upsets your stomach, take it with food. Take your medicine at regular intervals.  Do not take it more often than directed. Do not stop taking except on your doctor's advice. Stopping this medicine may increase your risk of a blot clot. Be sure to refill your prescription before you run out of medicine. Talk to your pediatrician regarding the use of this medicine in children. Special care may be needed. Overdosage: If you think you have taken too much of this medicine contact a poison control center or emergency room at once. NOTE: This medicine is only for you. Do not share this medicine with others. What if I miss a dose? If you miss a dose, take it as soon as you can. If it is almost time for your next dose, take only that dose. Do not take double or extra doses. What may interact with this medicine? This medicine may interact with the following: -aspirin and aspirin-like medicines -certain medicines for fungal infections like ketoconazole and itraconazole -certain medicines for seizures like carbamazepine and phenytoin -certain medicines that treat or prevent blood clots like warfarin, enoxaparin, and dalteparin -clarithromycin -NSAIDs, medicines for pain and inflammation, like ibuprofen or naproxen -rifampin -ritonavir -St. John's wort This list may not describe all possible interactions. Give your health care provider a list of all the medicines, herbs, non-prescription drugs, or dietary supplements you use. Also tell them if you smoke, drink alcohol, or use illegal drugs. Some items may interact with your medicine. What should I watch for while using this medicine? Notify your doctor or health care professional and seek emergency treatment if you develop breathing problems; changes in vision; chest pain; severe, sudden headache; pain, swelling, warmth in the leg; trouble speaking; sudden numbness or weakness of the face, arm, or leg. These can be signs that your condition has gotten worse. If you are going to have surgery, tell your doctor or health care professional  that you are taking this medicine. Tell your health care professional that you use this medicine before you have a spinal or epidural procedure. Sometimes people who take this medicine have bleeding problems around the spine when they have a spinal or epidural procedure. This bleeding is very rare. If you have a spinal or epidural procedure while on this medicine, call your health care professional immediately if you have back pain, numbness or tingling (especially in your legs and feet), muscle weakness, paralysis, or loss of bladder or bowel control. Avoid sports and activities that might cause injury while you are using this medicine. Severe falls or injuries can cause unseen bleeding. Be careful when using sharp tools or knives. Consider using an Copy. Take special care brushing or flossing your teeth. Report any injuries, bruising, or red spots on the skin to your doctor or health care professional. What side effects may I notice from receiving this medicine? Side effects that you should report to your doctor or health care professional as soon as possible: -allergic reactions like skin rash, itching or hives, swelling of the face, lips, or tongue -signs and symptoms of bleeding such as bloody or black, tarry stools; red or dark-brown urine; spitting up  blood or brown material that looks like coffee grounds; red spots on the skin; unusual bruising or bleeding from the eye, gums, or nose This list may not describe all possible side effects. Call your doctor for medical advice about side effects. You may report side effects to FDA at 1-800-FDA-1088. Where should I keep my medicine? Keep out of the reach of children. Store at room temperature between 20 and 25 degrees C (68 and 77 degrees F). Throw away any unused medicine after the expiration date. NOTE: This sheet is a summary. It may not cover all possible information. If you have questions about this medicine, talk to your doctor,  pharmacist, or health care provider.

## 2016-02-07 NOTE — Progress Notes (Signed)
PT Cancellation Note  Patient Details Name: [name] MRN: [id number] DOB: 07-19-1945   Cancelled Treatment:    Reason Eval/Treat Not Completed: Other (comment); patient dressed and awaiting ride for d/c home.  States he did his exercises this morning and they did not cause dizziness, but he had a spell later for which he was medicated.  Seems to be set up with HHPT as he refused SNF.  Will defer further tx to HHPT.    Reginia Naas 02/07/2016, 3:30 PM  Magda Kiel, [place] 02/07/2016

## 2016-02-07 NOTE — Clinical Social Work Note (Signed)
Clinical Social Work Assessment  Patient Details  Name: [name] MRN: [id number] Date of Birth: Feb 26, 1946  Date of referral:  02/07/16               Reason for consult:  Facility Placement                Permission sought to share information with:  Family Supports Permission granted to share information::  Yes, Verbal Permission Granted  Name::     Sonya Long  Relationship::  Daughter  Contact Information:  [phone]  Housing/Transportation Living arrangements for the past 2 months:  [place] of Information:  Patient Patient Interpreter Needed:  None Criminal Activity/Legal Involvement Pertinent to Current Situation/Hospitalization:  No - Comment as needed Significant Relationships:  Adult Children Lives with:  Self Do you feel safe going back to the place where you live?  No Need for family participation in patient care:  Yes (Comment)  Care giving concerns:  Patient with no family/friends at bedside and patient does not express concerns at this time.   Social Worker assessment / plan:  Holiday representative met with patient at bedside to offer support and discuss patient needs at discharge.  Patient states that he has been living at home and does not feel that he can manage on his own.  Patient has been to [name] and [facility] and unhappy with both.  Patient most recently was at [facility] and agreeable to return, however states that he owes money and was noncompliant at times so he doesn't know if he can return.  CSW initiated referral and insurance authorization - CSW spoke with [employer] who is going to speak with Secondary school teacher and determine if patient can return.  Patient did receive insurance authorization to admit to [employer] if bed available.  Patient has officially appealed his discharge and RNCM aware.  CSW remains available for support and to facilitate patient discharge needs.  Employment status:   Retired Nurse, adult PT Recommendations:  [employer] / Referral to community resources:  [place]  Patient/Family's Response to care:  Patient verbalized understanding and appreciation for CSW role, support, and involvement.  Patient is hopeful for placement, however until placement will occur he has appealed his discharge.  Patient/Family's Understanding of and Emotional Response to Diagnosis, Current Treatment, and Prognosis:  Patient understanding of his current medical conditions and realistic regarding the need for placement, however admits that once placed at times he becomes noncompliant.  Patient states that he plans to do better with this as he knows the facilities are only trying to help him.  Emotional Assessment Appearance:  Appears stated age Attitude/Demeanor/Rapport:  Complaining Affect (typically observed):  Appropriate, Frustrated Orientation:  Oriented to Self, Oriented to Situation, Oriented to Place, Oriented to  Time Alcohol / Substance use:  Not Applicable Psych involvement (Current and /or in the community):  No (Comment)  Discharge Needs  Concerns to be addressed:  Discharge Planning Concerns Readmission within the last 30 days:  No Current discharge risk:  Physical Impairment, Lack of support system, Lives alone Barriers to Discharge:  Continued Medical Work up, Other (Patient appeal)  Barbette Or, [place]

## 2016-02-07 NOTE — Plan of Care (Signed)
Problem: Safety: Goal: Ability to remain free from injury will improve Outcome: Progressing RN instructed patient to call and wait for staff assistance prior to getting out of bed.  Patient stated understanding and has made no attempts to get out of bed unassisted.

## 2016-02-07 NOTE — Progress Notes (Signed)
   Subjective: No acute events overnight. Patient has mild dizziness this morning. Otherwise he is feeling well.  Objective:  Vital signs in last 24 hours: Vitals:   02/06/16 2044 02/06/16 2354 02/07/16 0500 02/07/16 0855  BP: 99/69 101/70 100/73   Pulse: 79  67   Resp: (!) 21  16   Temp: 98.7 F (37.1 C)  98.3 F (36.8 C)   TempSrc: Oral  Oral   SpO2: 100%  98% 97%  Weight:      Height:       Physical Exam  Constitutional: He is oriented to person, place, and time. He appears well-developed and well-nourished. No distress.  HENT:  Head: Normocephalic and atraumatic.  Cardiovascular: Normal rate.  Exam reveals no gallop and no friction rub.   No murmur heard. Irregular rhythm  Respiratory: Effort normal and breath sounds normal. No respiratory distress. He has no wheezes.  GI: Soft. Bowel sounds are normal. He exhibits no distension. There is no tenderness.  Musculoskeletal: He exhibits no edema.  Neurological: He is alert and oriented to person, place, and time.     Assessment/Plan: Mr. Pardee is a 70 year old male with a past medical history of COPD, major depression, asbestospleural disease, hypertension, congestive heart failure, A. fib and CKD stage III who presented with diarrhea most likely secondary to fecal impaction.  1. Stercoral Colitis, resolved  Patient refused soapsuds enema over the weekend. However, he did have a large bowel movement and says that his abdominal pain has improved. Patient is eating meals appropriately. -- Continue home dronabinol 2.5 mg daily  2. Atrial flutter, stable Patient has a history of atrial fibrillation. On examination this morning the patient was no longer tachycardic. Patient has been hypotensive but maps have been greater than 70. We have spoken with cardiology (Dr. Rayann Heman) about changing his medications and they suggested that we leave him on the dose below. Cardiology will contact the patient to schedule a follow-up in the  outpatient setting. -- Continue Eliquis and aspirin -- Started diltiazem extended release 120 mg once daily, metoprolol extended release 150 mg once daily  3. Urinary tract infection Urinalysis demonstratedmoderate leukocytosis. Has a history of urinary tract infection on prior admission. Patient's culture demonstrating Escherichia coli that is resistant to ciprofloxacin and is sensitive to all other antibiotics tested.  -- Continue trimethoprim sulfamethoxazole for a five-day course  4. Intermittent vertigo Complains of intermittent episodes of vertigo associated with nausea. He has had these episodes in the past. He had one severe episode yesterday afternoon when he became extremely nauseated. He says that he was feeling better this morning. The differential diagnosis for this represents vertigo, BPPV, Mnire's disease or other vestibular pathology. -- Meclizine 25 mg as needed for dizziness -- Physical therapy and vestibular rehabilitation ordered -- Follow-up with outpatient physicians for further management  5. Acute kidney injury, stable Patient with a creatinine of 2.16 on admission his baseline is around 1.5-2.5. This is most likely prerenal in the setting of dehydration. -- Most recent creatinine 1.74  6. Anxiety and depression -- Continue home bupropion 75 mg twice daily -- Discontinue Remeron as patient states he does not take this at home  7. COPD -- Continue Spiriva -- Albuterol when necessary  8. DVT/PE prophylaxis -- Therapeutically anticoagulated for atrial flutter with Eliquis  Dispo: Today.   Ophelia Shoulder, MD 02/07/2016, 11:18 AM Pager: [phone]

## 2016-02-07 NOTE — Plan of Care (Signed)
Problem: Education: Goal: Knowledge of [facility] Education information/materials will improve Outcome: Progressing Patient aware of plan of care.  RN provided medication education on all medications administered thus far this shift.  Patient stated understanding.     

## 2016-02-07 NOTE — Clinical Social Work Note (Signed)
CSW was informed by patient that he plans to discharge home today. RNCM updated on patient's disposition. CSW signing off at this time.   Liz Beach MSW, [place], [place], [phone]

## 2016-02-08 ENCOUNTER — Other Ambulatory Visit: Payer: Self-pay | Admitting: *Deleted

## 2016-02-08 NOTE — Patient Outreach (Signed)
[facility] [facility]) Care Management  02/08/2016  CARPENTER PANNIER 04-10-1946 [id number]   RN attempted outreach call to pt who discharged on yesterday however pt not available.RN able to leave a HIPAA approved voice message requesting a call back. Will further inquire on pt's needs and possible assistance needed at that time. Will plan to follow up ongoing transition of care call accordingly.  [name], RN Care Management Coordinator [place] Office [phone]

## 2016-02-09 ENCOUNTER — Other Ambulatory Visit: Payer: Self-pay | Admitting: Internal Medicine

## 2016-02-09 ENCOUNTER — Encounter: Payer: Self-pay | Admitting: Internal Medicine

## 2016-02-09 DIAGNOSIS — I481 Persistent atrial fibrillation: Secondary | ICD-10-CM | POA: Diagnosis not present

## 2016-02-09 DIAGNOSIS — B9629 Other Escherichia coli [E. coli] as the cause of diseases classified elsewhere: Secondary | ICD-10-CM | POA: Diagnosis not present

## 2016-02-09 DIAGNOSIS — N390 Urinary tract infection, site not specified: Secondary | ICD-10-CM | POA: Diagnosis not present

## 2016-02-09 DIAGNOSIS — I5042 Chronic combined systolic (congestive) and diastolic (congestive) heart failure: Secondary | ICD-10-CM | POA: Diagnosis not present

## 2016-02-09 DIAGNOSIS — J449 Chronic obstructive pulmonary disease, unspecified: Secondary | ICD-10-CM | POA: Diagnosis not present

## 2016-02-09 DIAGNOSIS — J129 Viral pneumonia, unspecified: Secondary | ICD-10-CM | POA: Diagnosis not present

## 2016-02-09 DIAGNOSIS — I130 Hypertensive heart and chronic kidney disease with heart failure and stage 1 through stage 4 chronic kidney disease, or unspecified chronic kidney disease: Secondary | ICD-10-CM | POA: Diagnosis not present

## 2016-02-09 DIAGNOSIS — R918 Other nonspecific abnormal finding of lung field: Secondary | ICD-10-CM | POA: Diagnosis not present

## 2016-02-09 DIAGNOSIS — J441 Chronic obstructive pulmonary disease with (acute) exacerbation: Secondary | ICD-10-CM | POA: Diagnosis not present

## 2016-02-09 DIAGNOSIS — R2681 Unsteadiness on feet: Secondary | ICD-10-CM | POA: Diagnosis not present

## 2016-02-09 DIAGNOSIS — I5022 Chronic systolic (congestive) heart failure: Secondary | ICD-10-CM | POA: Diagnosis not present

## 2016-02-09 DIAGNOSIS — M6281 Muscle weakness (generalized): Secondary | ICD-10-CM | POA: Diagnosis not present

## 2016-02-09 DIAGNOSIS — N183 Chronic kidney disease, stage 3 (moderate): Secondary | ICD-10-CM | POA: Diagnosis not present

## 2016-02-09 DIAGNOSIS — K529 Noninfective gastroenteritis and colitis, unspecified: Secondary | ICD-10-CM | POA: Diagnosis not present

## 2016-02-09 DIAGNOSIS — N19 Unspecified kidney failure: Secondary | ICD-10-CM | POA: Diagnosis not present

## 2016-02-09 DIAGNOSIS — I4891 Unspecified atrial fibrillation: Secondary | ICD-10-CM | POA: Diagnosis not present

## 2016-02-12 ENCOUNTER — Telehealth: Payer: Self-pay | Admitting: Internal Medicine

## 2016-02-12 ENCOUNTER — Encounter: Payer: Commercial Managed Care - HMO | Admitting: Internal Medicine

## 2016-02-12 ENCOUNTER — Other Ambulatory Visit: Payer: Self-pay | Admitting: *Deleted

## 2016-02-12 ENCOUNTER — Telehealth: Payer: Self-pay | Admitting: Student

## 2016-02-12 NOTE — Telephone Encounter (Signed)
[place] for, [facility] care - requesting verbal order for "Nurse for medication management 1 time a week x 5 weeks".  Verbal order given. If this is not appropriate, let me know. Thanks

## 2016-02-12 NOTE — Telephone Encounter (Signed)
[name] from [facility] calling requesting an order to start [place].

## 2016-02-12 NOTE — Telephone Encounter (Signed)
Return [name]'s call - left message to call me back.

## 2016-02-12 NOTE — Telephone Encounter (Signed)
Yes that is appropriate, thanks Glenda.

## 2016-02-12 NOTE — Telephone Encounter (Signed)
Calling for orders for  Nurse 1 x week for 5 weeks for med management.

## 2016-02-12 NOTE — Patient Outreach (Signed)
[facility]) Care Management  02/12/2016  [name] Sep 20, 1945 [id number]   RN attempted to reach pt once again (second attempt) that was unsuccessful. RN able to leave a HIPAA approved voice message requesting a call back. Will inquire further at that time. Note [facility] hospital liaison indicated pt pending an appointment with [place] ([place]). RN followed up with Manus Gunning with [facility] who verified the pending appointment however may be over the next 1-2 weeks before connecting with this pt. RN will continues transition of care contacts during this pt. Will outreach once again tomorrow if successful will sent outreach letter per protocol.  Note [facility] will notify [facility] CMA once active with this pt.  [name], RN Care Management Coordinator [place] Office [phone]

## 2016-02-12 NOTE — Telephone Encounter (Signed)
Returning [name]

## 2016-02-13 ENCOUNTER — Encounter: Payer: Self-pay | Admitting: *Deleted

## 2016-02-13 ENCOUNTER — Other Ambulatory Visit: Payer: Self-pay | Admitting: *Deleted

## 2016-02-13 ENCOUNTER — Encounter: Payer: Self-pay | Admitting: Internal Medicine

## 2016-02-13 NOTE — Patient Outreach (Signed)
[facility]) Care Management  02/13/2016  NOE GLOD 1945-06-10 [id number]   RN received a voice message from [place] Manus Gunning) indicating the agency is currently awaiting a call back from the primary provide to engage with this pt. Will alert when this process has taken place.   RN contacted the pt with this update in response to an agency that was suppose to visit on the past weekend that was not that of [facility] due to awaiting permission from the provider to contact this pt. RN encourage pt to attempt to connect with the agency concerning the missed appointment.   RN will once again follow up next week and allow [facility] once again to engage with this pt.  [name], RN Care Management Coordinator [place] Office [phone]

## 2016-02-13 NOTE — Patient Outreach (Addendum)
[name] [facility]) Care Management  02/13/2016  SYLVAIN BROCCOLI 12-17-1945 [id number]   RN spoke with pt today and reintroduced the [facility] program and services related to his recent discharge from the hospital. Pt  States he is doing well with limited transportation but able to obtain family to assist with need so far. RN discussed the recent provided information on previous visits for [facility] if needed for future transportation ( pt aware). RN able to completed the initial assessment and verify update information concerning his support system. Transition of care template completed and pt verifies he continues to use his home O2 ( encouraged pt to use as prescribed to avoid acute breathing events) and take all provided medications. Plan of care discussed with goals based upon the information provided today. Strongly encouraged pt to take all prescribed medications to prevent readmission of his ongoing medical problems that can result in another admission (pt agreed).  Also discussed [facility] who is pending an appointment to activate this pt for services. Pt is aware and indicated they were late to an appointment on Saturday. RN offered to assist and contact [facility] and request a call to the pt at this hour to rescheduled that appointment. Pt appreciative and agreed with the offered assistance. RN contact [facility] and explained the delay and requested a call be made to the pt at this time to rescheduled the missed appointments. [facility] indicated the message would be delivered to [place] to place such call to reschedule the appointment. RN offered to follow up with pt next week to allow [facility] to reschedule their appointment this week to intervene with pt's care. Strongly encouraged pt to consider this enrollment based upon his health and medical issues that continues to reoccur.  Pt as agreed with a follow up call next week and will follow up with the pending appointment  with [facility].   [name], RN Care Management Coordinator [place] Office [phone]

## 2016-02-14 ENCOUNTER — Ambulatory Visit: Payer: Commercial Managed Care - HMO

## 2016-02-14 DIAGNOSIS — N19 Unspecified kidney failure: Secondary | ICD-10-CM | POA: Diagnosis not present

## 2016-02-14 DIAGNOSIS — R2681 Unsteadiness on feet: Secondary | ICD-10-CM | POA: Diagnosis not present

## 2016-02-14 DIAGNOSIS — J449 Chronic obstructive pulmonary disease, unspecified: Secondary | ICD-10-CM | POA: Diagnosis not present

## 2016-02-14 DIAGNOSIS — J441 Chronic obstructive pulmonary disease with (acute) exacerbation: Secondary | ICD-10-CM | POA: Diagnosis not present

## 2016-02-14 DIAGNOSIS — M6281 Muscle weakness (generalized): Secondary | ICD-10-CM | POA: Diagnosis not present

## 2016-02-14 DIAGNOSIS — I4891 Unspecified atrial fibrillation: Secondary | ICD-10-CM | POA: Diagnosis not present

## 2016-02-14 DIAGNOSIS — I5022 Chronic systolic (congestive) heart failure: Secondary | ICD-10-CM | POA: Diagnosis not present

## 2016-02-14 DIAGNOSIS — J129 Viral pneumonia, unspecified: Secondary | ICD-10-CM | POA: Diagnosis not present

## 2016-02-15 ENCOUNTER — Ambulatory Visit: Payer: Commercial Managed Care - HMO

## 2016-02-16 ENCOUNTER — Inpatient Hospital Stay (HOSPITAL_COMMUNITY): Payer: Commercial Managed Care - HMO

## 2016-02-16 ENCOUNTER — Observation Stay (HOSPITAL_COMMUNITY)
Admission: AD | Admit: 2016-02-16 | Discharge: 2016-02-20 | Disposition: A | Discharge status: Home / Self Care | Payer: Commercial Managed Care - HMO | Source: Ambulatory Visit | Attending: Oncology | Admitting: Oncology

## 2016-02-16 ENCOUNTER — Encounter (HOSPITAL_COMMUNITY): Payer: Self-pay

## 2016-02-16 ENCOUNTER — Ambulatory Visit (INDEPENDENT_AMBULATORY_CARE_PROVIDER_SITE_OTHER): Payer: Commercial Managed Care - HMO | Admitting: Internal Medicine

## 2016-02-16 VITALS — BP 97/75 | HR 64 | Temp 97.8°F | Ht 75.0 in | Wt 143.3 lb

## 2016-02-16 DIAGNOSIS — N183 Chronic kidney disease, stage 3 (moderate): Secondary | ICD-10-CM | POA: Diagnosis not present

## 2016-02-16 DIAGNOSIS — Z950 Presence of cardiac pacemaker: Secondary | ICD-10-CM | POA: Diagnosis not present

## 2016-02-16 DIAGNOSIS — I5022 Chronic systolic (congestive) heart failure: Secondary | ICD-10-CM | POA: Insufficient documentation

## 2016-02-16 DIAGNOSIS — Z515 Encounter for palliative care: Secondary | ICD-10-CM

## 2016-02-16 DIAGNOSIS — E039 Hypothyroidism, unspecified: Secondary | ICD-10-CM | POA: Diagnosis not present

## 2016-02-16 DIAGNOSIS — Z7982 Long term (current) use of aspirin: Secondary | ICD-10-CM | POA: Insufficient documentation

## 2016-02-16 DIAGNOSIS — I252 Old myocardial infarction: Secondary | ICD-10-CM | POA: Insufficient documentation

## 2016-02-16 DIAGNOSIS — I130 Hypertensive heart and chronic kidney disease with heart failure and stage 1 through stage 4 chronic kidney disease, or unspecified chronic kidney disease: Secondary | ICD-10-CM | POA: Diagnosis not present

## 2016-02-16 DIAGNOSIS — Z79899 Other long term (current) drug therapy: Secondary | ICD-10-CM | POA: Diagnosis not present

## 2016-02-16 DIAGNOSIS — N189 Chronic kidney disease, unspecified: Secondary | ICD-10-CM

## 2016-02-16 DIAGNOSIS — J441 Chronic obstructive pulmonary disease with (acute) exacerbation: Secondary | ICD-10-CM | POA: Diagnosis present

## 2016-02-16 DIAGNOSIS — F329 Major depressive disorder, single episode, unspecified: Secondary | ICD-10-CM | POA: Insufficient documentation

## 2016-02-16 DIAGNOSIS — J449 Chronic obstructive pulmonary disease, unspecified: Secondary | ICD-10-CM | POA: Insufficient documentation

## 2016-02-16 DIAGNOSIS — I4819 Other persistent atrial fibrillation: Secondary | ICD-10-CM

## 2016-02-16 DIAGNOSIS — K5900 Constipation, unspecified: Secondary | ICD-10-CM | POA: Diagnosis not present

## 2016-02-16 DIAGNOSIS — I739 Peripheral vascular disease, unspecified: Secondary | ICD-10-CM | POA: Diagnosis present

## 2016-02-16 DIAGNOSIS — Z7901 Long term (current) use of anticoagulants: Secondary | ICD-10-CM | POA: Diagnosis not present

## 2016-02-16 DIAGNOSIS — I5042 Chronic combined systolic (congestive) and diastolic (congestive) heart failure: Secondary | ICD-10-CM | POA: Diagnosis present

## 2016-02-16 DIAGNOSIS — N179 Acute kidney failure, unspecified: Secondary | ICD-10-CM | POA: Diagnosis present

## 2016-02-16 DIAGNOSIS — N400 Enlarged prostate without lower urinary tract symptoms: Secondary | ICD-10-CM | POA: Diagnosis present

## 2016-02-16 DIAGNOSIS — I481 Persistent atrial fibrillation: Secondary | ICD-10-CM

## 2016-02-16 DIAGNOSIS — E43 Unspecified severe protein-calorie malnutrition: Secondary | ICD-10-CM | POA: Insufficient documentation

## 2016-02-16 DIAGNOSIS — I10 Essential (primary) hypertension: Secondary | ICD-10-CM | POA: Diagnosis present

## 2016-02-16 DIAGNOSIS — R630 Anorexia: Secondary | ICD-10-CM | POA: Diagnosis present

## 2016-02-16 DIAGNOSIS — F339 Major depressive disorder, recurrent, unspecified: Secondary | ICD-10-CM | POA: Diagnosis present

## 2016-02-16 DIAGNOSIS — I4811 Longstanding persistent atrial fibrillation: Secondary | ICD-10-CM | POA: Diagnosis present

## 2016-02-16 LAB — COMPREHENSIVE METABOLIC PANEL
ALT: 13 U/L — AB (ref 17–63)
AST: 19 U/L (ref 15–41)
Albumin: 3.5 g/dL (ref 3.5–5.0)
Alkaline Phosphatase: 60 U/L (ref 38–126)
Anion gap: 10 (ref 5–15)
BILIRUBIN TOTAL: 0.8 mg/dL (ref 0.3–1.2)
BUN: 36 mg/dL — ABNORMAL HIGH (ref 6–20)
CALCIUM: 9.3 mg/dL (ref 8.9–10.3)
CHLORIDE: 106 mmol/L (ref 101–111)
CO2: 19 mmol/L — ABNORMAL LOW (ref 22–32)
CREATININE: 2.75 mg/dL — AB (ref 0.61–1.24)
GFR, EST AFRICAN AMERICAN: 25 mL/min — AB (ref >60)
GFR, EST NON AFRICAN AMERICAN: 22 mL/min — AB (ref >60)
Glucose, Bld: 101 mg/dL — ABNORMAL HIGH (ref 65–99)
Potassium: 5.2 mmol/L — ABNORMAL HIGH (ref 3.5–5.1)
Sodium: 135 mmol/L (ref 135–145)
TOTAL PROTEIN: 6.7 g/dL (ref 6.5–8.1)

## 2016-02-16 LAB — APTT: APTT: 30 s (ref 24–36)

## 2016-02-16 LAB — CBC WITH DIFFERENTIAL/PLATELET
BASOS ABS: 0 10*3/uL (ref 0.0–0.1)
Basophils Relative: 0 %
EOS ABS: 0.1 10*3/uL (ref 0.0–0.7)
EOS PCT: 2 %
HCT: 44.3 % (ref 39.0–52.0)
Hemoglobin: 14.6 g/dL (ref 13.0–17.0)
Lymphocytes Relative: 25 %
Lymphs Abs: 1.8 10*3/uL (ref 0.7–4.0)
MCH: 29.9 pg (ref 26.0–34.0)
MCHC: 33 g/dL (ref 30.0–36.0)
MCV: 90.6 fL (ref 78.0–100.0)
MONO ABS: 0.6 10*3/uL (ref 0.1–1.0)
Monocytes Relative: 9 %
NEUTROS PCT: 64 %
Neutro Abs: 4.7 10*3/uL (ref 1.7–7.7)
PLATELETS: 205 10*3/uL (ref 150–400)
RBC: 4.89 MIL/uL (ref 4.22–5.81)
RDW: 15.2 % (ref 11.5–15.5)
WBC: 7.2 10*3/uL (ref 4.0–10.5)

## 2016-02-16 LAB — PROTIME-INR
INR: 1.17
PROTHROMBIN TIME: 14.9 s (ref 11.4–15.2)

## 2016-02-16 MED ORDER — ASPIRIN 81 MG PO CHEW
81.0000 mg | CHEWABLE_TABLET | Freq: Every day | ORAL | Status: DC
  Administered 2016-02-17 – 2016-02-20 (×4): 81 mg via ORAL
  Filled 2016-02-16 (×4): qty 1

## 2016-02-16 MED ORDER — OXYCODONE HCL 5 MG PO TABS
5.0000 mg | ORAL_TABLET | ORAL | Status: DC | PRN
  Administered 2016-02-16 – 2016-02-18 (×3): 5 mg via ORAL
  Filled 2016-02-16 (×3): qty 1

## 2016-02-16 MED ORDER — PANTOPRAZOLE SODIUM 40 MG PO TBEC
40.0000 mg | DELAYED_RELEASE_TABLET | Freq: Every day | ORAL | Status: DC
  Administered 2016-02-17 – 2016-02-20 (×4): 40 mg via ORAL
  Filled 2016-02-16 (×4): qty 1

## 2016-02-16 MED ORDER — OXYCODONE-ACETAMINOPHEN 10-325 MG PO TABS: 1.0000 | ORAL_TABLET | ORAL | Status: DC | PRN

## 2016-02-16 MED ORDER — DOCUSATE SODIUM 100 MG PO CAPS
100.0000 mg | ORAL_CAPSULE | Freq: Two times a day (BID) | ORAL | Status: DC
  Administered 2016-02-16 – 2016-02-20 (×8): 100 mg via ORAL
  Filled 2016-02-16 (×8): qty 1

## 2016-02-16 MED ORDER — METOPROLOL SUCCINATE ER 100 MG PO TB24: 100.0000 mg | ORAL_TABLET | Freq: Every day | ORAL | Status: DC

## 2016-02-16 MED ORDER — METOPROLOL SUCCINATE ER 50 MG PO TB24
150.0000 mg | ORAL_TABLET | Freq: Every day | ORAL | Status: DC
  Administered 2016-02-17 – 2016-02-20 (×4): 150 mg via ORAL
  Filled 2016-02-16 (×5): qty 3

## 2016-02-16 MED ORDER — DEXTROSE-NACL 5-0.45 % IV SOLN
INTRAVENOUS | Status: AC
  Administered 2016-02-16 – 2016-02-17 (×2): via INTRAVENOUS

## 2016-02-16 MED ORDER — ALBUTEROL SULFATE (2.5 MG/3ML) 0.083% IN NEBU: 2.5000 mg | INHALATION_SOLUTION | Freq: Four times a day (QID) | RESPIRATORY_TRACT | Status: DC | PRN

## 2016-02-16 MED ORDER — TIOTROPIUM BROMIDE MONOHYDRATE 18 MCG IN CAPS
18.0000 ug | ORAL_CAPSULE | Freq: Every day | RESPIRATORY_TRACT | Status: DC
  Administered 2016-02-17 – 2016-02-20 (×3): 18 ug via RESPIRATORY_TRACT
  Filled 2016-02-16: qty 5

## 2016-02-16 MED ORDER — ROSUVASTATIN CALCIUM 20 MG PO TABS
20.0000 mg | ORAL_TABLET | Freq: Every day | ORAL | Status: DC
  Administered 2016-02-16 – 2016-02-19 (×4): 20 mg via ORAL
  Filled 2016-02-16 (×4): qty 1

## 2016-02-16 MED ORDER — SODIUM CHLORIDE 0.9 % IV BOLUS (SEPSIS): 500.0000 mL | Freq: Once | INTRAVENOUS | Status: DC

## 2016-02-16 MED ORDER — SODIUM CHLORIDE 0.9% FLUSH
3.0000 mL | Freq: Two times a day (BID) | INTRAVENOUS | Status: DC
Start: 2016-02-16 — End: 2016-02-20
  Administered 2016-02-17 – 2016-02-19 (×4): 3 mL via INTRAVENOUS

## 2016-02-16 MED ORDER — SENNA 8.6 MG PO TABS
1.0000 | ORAL_TABLET | Freq: Two times a day (BID) | ORAL | Status: DC
  Administered 2016-02-16 – 2016-02-18 (×4): 8.6 mg via ORAL
  Filled 2016-02-16 (×4): qty 1

## 2016-02-16 MED ORDER — OXYCODONE-ACETAMINOPHEN 5-325 MG PO TABS
1.0000 | ORAL_TABLET | ORAL | Status: DC | PRN
  Administered 2016-02-16 – 2016-02-19 (×5): 1 via ORAL
  Filled 2016-02-16 (×5): qty 1

## 2016-02-16 MED ORDER — BUPROPION HCL 75 MG PO TABS
75.0000 mg | ORAL_TABLET | Freq: Two times a day (BID) | ORAL | Status: DC
  Administered 2016-02-16 – 2016-02-18 (×4): 75 mg via ORAL
  Filled 2016-02-16 (×4): qty 1

## 2016-02-16 MED ORDER — ENSURE ENLIVE PO LIQD
237.0000 mL | Freq: Two times a day (BID) | ORAL | Status: DC
  Administered 2016-02-17: 237 mL via ORAL

## 2016-02-16 MED ORDER — DILTIAZEM HCL ER COATED BEADS 120 MG PO CP24
120.0000 mg | ORAL_CAPSULE | Freq: Every day | ORAL | Status: DC
  Administered 2016-02-17 – 2016-02-20 (×4): 120 mg via ORAL
  Filled 2016-02-16 (×4): qty 1

## 2016-02-16 MED ORDER — VITAMIN D 1000 UNITS PO TABS
1000.0000 [IU] | ORAL_TABLET | Freq: Every day | ORAL | Status: DC
  Administered 2016-02-17 – 2016-02-20 (×4): 1000 [IU] via ORAL
  Filled 2016-02-16 (×4): qty 1

## 2016-02-16 MED ORDER — APIXABAN 5 MG PO TABS
5.0000 mg | ORAL_TABLET | Freq: Two times a day (BID) | ORAL | Status: DC
  Administered 2016-02-16 – 2016-02-20 (×8): 5 mg via ORAL
  Filled 2016-02-16 (×8): qty 1

## 2016-02-16 NOTE — H&P (Signed)
Date: 02/16/2016               Patient Name:  [name] MRN: [id number]  DOB: [birth year] Age / Sex: 70 y.o., male   PCP: Zada Finders, MD         Medical Service: Internal Medicine Teaching Service         Attending Physician: Dr. Annia Belt, MD    First Contact: Dr. Danford Bad Pager: [phone]  Second Contact: Dr. Charlynn Grimes Pager: [phone]       After Hours (After 5p/  First Contact Pager: [phone]  weekends / holidays): Second Contact Pager: [phone]   Chief Complaint: constipation, abdominal pain  History of Present Illness: Mr. Montminy is a 70yo male with PMH of COPD on O2, MDD, asbestos pleural disease, HTN, CHF, A. Fib on Eliquis, tachy-brady syndrome s/p pacemaker placement, BPH and CKD stage 3 presenting with symptoms of constipation, abdominal pain, dizziness and lack of appetite. Patient was recently discharge from [facility] for stercoral colitis, atrial flutter and UTI. He states he has not had a bowel movement since 9/17, and he has not taken any stool softeners while at home; he is passing gas. His abdominal pain began day before admission, is intermittent and mainly in the RLQ and does not radiate. His appetite is chronically decreased so his PO intake is suboptimal; in the last two days he states he has had 2 Ensures and 3 bottles of water. He has been tried on [employer] which initially helped but not anymore, and remeron which did boost his appetite but left him very drowsy the next day. He endorses one episode of nausea earlier today but no vomiting. He has been having vertigo like symptoms with negative orthostatics at last admission with relief with meclazine; now he describes the dizziness as near syncope with tunnel vision and weakness when he sits up or lays down. He also has a history of UTI's secondary to BPH and was d/c with 5 day course of Bactrim; patient states intermittent compliance with taking the antibiotic. He still has trouble initiating a stream and  associated symptoms of dysuria and urgency, but denies hematuria or flank pain. He was on tamsulosin at one point but was discontinued due to dizziness and weakness. He was set up with a urology appointment which he has not kept yet. He also endorses one episode of chest pain at rest that began around the site of his pacemaker and radiated diagonally down his chest; the pain was sharp and not associated with increased shortness of breath, nausea, vomiting, or diaphoresis and resolved on its own. He still has intermittent shortness of breath that he describes as hyperventilation which is improved by O2 use - occurs 3-4 times a week and he keeps his oxygen on for about one hours during those times and uses albuterol.   Otherwise, he denies headache, vision changes, hearing changes, new numbness (has chronic bilateral LE numbness R>L post DVT's), or focal weakness.  Patient has not taken his daily medicines on day of admission.   CMP: Na 135, K 5.2, Cl 106, CO2 19, Glu 101, BUN 36, Cr 2.75, Ca 9.3, Protein 6.7, Alb 3.5, Ast/Alt/alk phos/tot bili wnl, GFR 25.  CBC w/ diff: WBC 7.2, Hgb 14.6, Hct 44.3, Plt 205; Smear shows elliptocytes and atypical lymphocytes.   Meds:  No outpatient prescriptions have been marked as taking for the 02/16/16 encounter [facility] Encounter).  Dronabinol 2.33m BID Furosemide 497mdaily Mirtazapine 3035mhs Senna-docusate  Albuterol Eliquis 84m BID ASA 891mdaily Bupropion 7539mID Cholecalciferol 1000U daily Diltiazem 120m10mily Metoprolol 150mg80mly Omeprazole 40mg 28my Percocet 10-325mg q52mPRN Rosuvastatin 20mg da80mSpiriva   Allergies: Allergies as of 02/16/2016 - Review Complete 02/16/2016  Allergen Reaction Noted  . Amiodarone Other (See Comments) 07/30/2015  . Penicillins Rash    Past Medical History:  Diagnosis Date  . Alcohol abuse   . Anxiety   . Avascular necrosis of bones of both hips (HCC)    Status post bilateral hip replacements  .  Bipolar disorder (HCC)   .KetteringF (congestive heart failure) (HCC)   .[place] systolic heart failure (HCC)    HobsonNICM - EF 35% with normal cors 2003. b. Last echo 11/2012 - EF 25-30%.  . CKD (chronic kidney disease)    Renal U/S 12/04/2009 showed no pathological findings. Labs 12/04/2009 include normal ESR, C3, C4; neg ANA; SPEP showed nonspecific increase in the alpha-2 region with no M-spike; UPEP showed no monoclonal free light chains; urine IFE showed polyclonal increase in feree Kappa and/or free Lambda light chains. Baseline Cr reported 1.7-2.5.  . COPD (Marland Kitchenhronic obstructive pulmonary disease) (HCC)   .[place] Med[place]ression   . DVT (deep venous thrombosis) (HCC)    [place] hypercoagulability with multiple prior DVTs  . E coli bacteremia   . Erectile dysfunction   . Gastritis   . GERD (gastroesophageal reflux disease)   . HCAP (healthcare-associated pneumonia) 08/24/2015  . Hydrocele, unspecified   . Hyperlipemia   . Hypertension   . 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  Thyroid scan 08/28/2009 was normal with no focal areas of abnormal increased or decreased activity seen; the uptake of I 131 sodium iodide at 24 hours was 5.7%.  TSH and free T4 normalized by 08/16/2009.  . Intestinal obstruction (HCC)   .[place] nodule    Chest CT scan on 12/14/2008 showed a nodular opacity at the left lung base felt to most likely represent scarring.  Follow-up chest CT scan on 06/02/2010 showed parenchymal scarring in the left apex, left  lower lobe, and lingula; some of this scarring at the left base had a nodular appearance, unchanged. Chest 09/2012 - stable.  . Noncompliance   . On home oxygen therapy    "3L periodically" (08/24/2015)  . Osteoarthritis   . Pleural plaque    H/o asbestos exposure. Chest CT on 06/02/2010 showed stable extensive calcified pleural plaques involving the left hemithorax, consistent with asbestos related pleural disease.  . Portal vein thrombosis    . Spondylosis   . STEMI (ST elevation myocardial infarction) (HCC) 5/2Opal016  . Tachycardia-bradycardia syndrome (HCC)   [employer] [facility]/p pacemaker Oct 2004. b. St. Jude gen change 2010.  . ThrombMarland Kitchencytopenia (HCC)   .[place]ombosis    History of arterial and venous thrombosis including portal vein thrombosis, deep vein thrombosis, and superior mesenteric artery thrombosis.  . Weight loss    Normal colonoscopy by Dr. Brodie oOlevia Perches9/2012.    Family History: Denies any family history of medical problems  Social History: Lives by himself, has family around him who check in on him occasionally. He has his groceries brought to him; does not cook except for using the microwave; able to bathe himself but cannot do light chores. He has a 75+ pack year smoking history; currently smokes about 1.5 packs in one week. Endorses current marijuana use, usually every day but has not smoked in about 1 week.  Denies other illicit drug use. Endorses past alcohol abuse, but nothing recently.   Review of Systems: A complete ROS was negative except as per HPI.   Physical Exam: Blood pressure 105/72, pulse 100, temperature 97.7 F (36.5 C), temperature source Oral, resp. rate 18, height _0  (1.905 m), weight 65.2 kg (143 lb 12.8 oz), SpO2 100 %. Constitutional: NAD, lying in bed comfortably watching TV, cachectic appearing CV: Irregularly irregular rhythm, tachycardic, no murmurs, rubs or gallops appreciated Resp: CTAB, no wheezing or crackles appreciated, no increased work of breathing Abd: soft, +BS, moderately tender to palpation in RLQ with voluntary guarding and mild tenderness to palpation in LLQ, no distention, no organomegaly appreciated Ext: cool, 2+ pulses throughout, able to move all extremities freely, 5/5 strength, decreased sensation in LE R>L up to knee.   EKG: atrial flutter, ventricular rate 101, ST and T wave abnormalities that seem to be present in past but hard to evaluate in setting of a  flutter  KUB: Official read pending - diffuse gas dilated bowel  CT abd/pelvis 02/02/2016: Possible stercoral colitis. Mildly prominent distal appendix w/o inflammation. Layering gallbladder sludge w/o inflammation. Calcified left pleural plaques.  PET scan 11/13/9483: No hypermetabolic mass or adenopathy identified. Bilateral calcified pleural plaques noted compatible with prior asbestos exposure. Aortic atherosclerosis and coronary artery calcification.  Assessment & Plan by Problem: Active Problems:   Essential hypertension   Cardiac pacemaker in situ   COPD (chronic obstructive pulmonary disease) (HCC)   Depression   Chronic kidney disease (CKD), stage III (moderate)   Chronic combined systolic and diastolic congestive heart failure (HCC)   PVD (peripheral vascular disease) (HCC)   BPH (benign prostatic hyperplasia)   Longstanding persistent atrial fibrillation (HCC)   Chronic pain   Decreased appetite  Constipation: Patient presents with no BM for 12 days and RLQ pain for 2 days duration without use of stool softerners. Patient's PO intake is poor due to chronic poor appetite and possibly complicated by lack of adequate food and preparation at home as well and use of percocet. Patient is still passing gas which is reassuring. He has had multiple admissions for this problem with poor compliance with recommendations though he gets relief while hospitalized with use of stool softeners and laxatives. CT abd/pelvis from last admission is negative for other causes of constipation.  --IVF --scheduled senna and colace  PAF: On Eliquis 42m BID for anticoagulation; metoprolol 1580mdaily and cardizem 12047maily. At last admission was in atrial flutter with RVR and required cardizem drip. EKG at admission is consistent with atrial flutter; patient had not taken his medicines the day of admission. --continue metoprolol 150m58mily and cardizem 120mg19mly; continue Eliquis 5mg B55m--cardiac  monitoring --AM EKG  Chronic deconditioning: Patient with sedentary lifestyle, poor po intake, and muscle wasting. He has had continued poor appetite despite therapy with merinol and remeron (latter causing increased drowsiness the following day). He also has lightheadedness that is associated with sitting up or lying down without nausea or vertigo. --Nutritional, PT/OT consults - appreciate their recommendations --[facility]  Poor appetite: Patient with decreased appetite for some time. Had initial improvement with marinol; improved with remeron but made too drowsy the following day. Patient had a PET scan in June which was negative for   Dizziness: Patient with continued symptoms of dizziness on changing position with feeling of presyncope, but no symptoms with moving his head or vertigo. Last admission, symptoms were more consistent with BPV. More  recent symptoms could be due to poor PO intake. --f/u orthostatic vital signs --PT/OT consults --nutritional support and IVF  BPH with possible UTI: Patient with history of BPH and recurrent UTI's 2/2 retention recently treated for UTI and presents at admission with dysuria. He has been on tamsulosin in the past but was discontinued due to dizziness.  --f/u UA --encourage patient to keep urology appointment for further management of BPH  Acute on chronic kidney disease: Patient with chronic kidney disease stage 3 with Cr of 2.75 at admission (b/l 1.4-2) in setting of decreased PO intake and chronic BPH. --NS bolus + maintenance IVF x 10hrs --monitor in's and out's, bladder scan  Hyperkalemia: Patient with K of 5.2 on admission.  --1L NS bolus, maintenance IVF --f/u AM labs  CHF: Patient with history of CHF; last ECHO in March 2017 showing EF 30% but was in setting of acute pulmonary edema episode. Patient does not appear volume overloaded on exam and has had decreased PO intake.  --hold lasix  --monitor closely with IVF  administration for signs of respiratory distress and volume overload  Anxiety: Patient taking buproprion 58m BID with adequate effect per patient. --continue home regimen  COPD: Patient is smoker with COPD on spiriva and albuterol at home. He also intermittently uses 3L O2. --continue home regimen --O2 sat goal 90-92%  IVF: 10034mNS bolus; D5 1/2 NS 10073mr x 10hrs DIET: Regular Code: FULL CODE, patient wishes aggressive measures in case of cardiorespiratory arrest; In case patient is unable to make decisions for himself, he designates his daughter SonErik Obey decMedia plannerncouraged family discussion about goals and patient's wishes for end of life as they had not had that conversation yet.   Dispo: Admit patient to Observation with expected length of stay less than 2 midnights.  Signed: GorAlphonzo GrieveD 02/16/2016, 9:18 PM  Pager: GorAlphonzo GrieveD IMTS - PGY1 Pager [phone]

## 2016-02-16 NOTE — Progress Notes (Addendum)
CC: Constipation, urinary retention, malaise, palpitations, dizziness  HPI:  [name] is a 70 y.o. male with PMHx detailed below presenting with above for the past 3-4 days. He has not had a bowel movement since 9/17. He also endorses significant urinary obstruction and is only able to urinate small amounts of urine with great difficulty. He is feeling "weak and miserable,"  Has no appetite, and states "there is no one at home to take care of me or fix me food." He reports 3-4 days of palpitations and malaise with constant dizziness that is worse on sitting up or standing. He reports taking all his medications since discharge including the antibiotics prescribed at previous admission, but is unsure when pressed on specifics or times of day. He has not brought his medications in with him today.  On exam he is alert and oriented, in no apparent distress breathing on 3L [place] - reporting headache and dizziness. His manual SBP is 85 while laying flat, and 74 while sitting. Faint peripheral pulses with cold fingers. His heart rate is fast and irregularly irregular (110-120 BPM). Lungs clear to auscultation. Abdomen with BS+, soft, non-distended but full to palpation, tender to palpation in RUQ/RLQ.   See problem based assessment and plan below for additional details.  Past Medical History:  Diagnosis Date  . Alcohol abuse   . Anxiety   . Avascular necrosis of bones of both hips (HCC)    Status post bilateral hip replacements  . Bipolar disorder ([place])   . CHF (congestive heart failure) ([place])   . Chronic systolic heart failure ([place])    a. NICM - EF 35% with normal cors 2003. b. Last echo 11/2012 - EF 25-30%.  . CKD (chronic kidney disease)    Renal U/S 12/04/2009 showed no pathological findings. Labs 12/04/2009 include normal ESR, C3, C4; neg ANA; SPEP showed nonspecific increase in the alpha-2 region with no M-spike; UPEP showed no monoclonal free light chains; urine IFE showed polyclonal  increase in feree Kappa and/or free Lambda light chains. Baseline Cr reported 1.7-2.5.  [name] COPD (chronic obstructive pulmonary disease) ([place])   . Depression   . DVT (deep venous thrombosis) ([place])    He has hypercoagulability with multiple prior DVTs  . E coli bacteremia   . Erectile dysfunction   . Gastritis   . GERD (gastroesophageal reflux disease)   . HCAP (healthcare-associated pneumonia) 08/24/2015  . Hydrocele, unspecified   . Hyperlipemia   . Hypertension   . 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  Thyroid scan 08/28/2009 was normal with no focal areas of abnormal increased or decreased activity seen; the uptake of I 131 sodium iodide at 24 hours was 5.7%.  TSH and free T4 normalized by 08/16/2009.  . Intestinal obstruction ([place])   . Lung nodule    Chest CT scan on 12/14/2008 showed a nodular opacity at the left lung base felt to most likely represent scarring.  Follow-up chest CT scan on 06/02/2010 showed parenchymal scarring in the left apex, left  lower lobe, and lingula; some of this scarring at the left base had a nodular appearance, unchanged. Chest 09/2012 - stable.  . Noncompliance   . On home oxygen therapy    "3L periodically" (08/24/2015)  . Osteoarthritis   . Pleural plaque    H/o asbestos exposure. Chest CT on 06/02/2010 showed stable extensive calcified pleural plaques involving the left hemithorax, consistent with asbestos related pleural disease.  . Portal  vein thrombosis   . Spondylosis   . STEMI (ST elevation myocardial infarction) ([place]) 10/11/2014  . Tachycardia-bradycardia syndrome [facility])    a. 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 b. St. Jude gen change 2010.  [name] Thrombocytopenia ([place])   . 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  . Weight loss    Normal colonoscopy by Dr. Olevia Perches on 11/06/2010.    Review of  Systems: Review of Systems  Constitutional: Positive for malaise/fatigue. Negative for chills and fever.  Respiratory: Positive for shortness of breath.   Gastrointestinal: Positive for abdominal pain, constipation and nausea.  Neurological: Positive for dizziness.    Physical Exam: Vitals:   02/16/16 1536  BP: 97/75  Pulse: 64  SpO2: (!) 87%  Weight: 143 lb 4.8 oz (65 kg)  Height: '6\' 3"'  (1.905 m)   Body mass index is 17.91 kg/m. GENERAL- Very thin tired-appearing elderly gentleman laying on exam table, wearing O2 nasal cannula, in no apparent distress, very dizzy after sitting up HEENT- Atraumatic, EOMI, moist mucous membranes CARDIAC- Tachycardic rate and irregular rhythm, no murmurs, rubs or gallops. RESP- Clear to ascultation bilaterally, no wheezing or crackles, normal work of breathing ABDOMEN- Normoactive bowel sounds, soft, mild tenderness to palpation, nondistended EXTREMITIES- Thin with normal range of motion, no edema, faint peripheral pulses, cool distal extremities SKIN- Warm, dry, intact, without visible rash PSYCH- Guarded affect, clear speech, thoughts linear and goal-directed  Assessment & Plan:   See encounters tab for problem based medical decision making.  Patient seen with Dr. Daryll Drown

## 2016-02-16 NOTE — Progress Notes (Signed)
Report called to Maudie Mercury, RN on [facility] [address]. Patient transported to [facility].  Oxygen going at 2 liters .  Patient alert and oriented.  Sander Nephew, RN 02/16/2016 5:18 PM

## 2016-02-16 NOTE — Telephone Encounter (Signed)
Being seen today for HFU

## 2016-02-16 NOTE — Telephone Encounter (Signed)
Transition Care Management Follow-up Telephone Call   Date discharged? 02/07/16   How have you been since you were released from the hospital? "not feeling very well today"   Do you understand why you were in the hospital? yes   Do you understand the discharge instructions? yes   Where were you discharged to? home   Items Reviewed:  Medications reviewed: yes  Allergies reviewed: yes  Dietary changes reviewed: Heart healthy  Referrals reviewed: yes-[facility]   Functional Questionnaire:   Activities of Daily Living (ADLs):   He states they are independent in the following: everytthing States they require assistance with the following: cooking   Any transportation issues/concerns?: Has to depend on son and/or daughter to bring to appt.   Any patient concerns? No appetite;"lossing weight"   Confirmed importance and date/time of follow-up visits scheduled today   Provider Appointment booked with [facility] at 1515 PM  Confirmed with patient if condition begins to worsen call PCP or go to the ER.  Patient was given the office number and encouraged to call back with question or concerns.  : yes

## 2016-02-16 NOTE — Telephone Encounter (Signed)
I have seen patient today in the hospital. He is agreeable to speak with Palliative Care and I have recommended consultation while admitted.

## 2016-02-17 DIAGNOSIS — F419 Anxiety disorder, unspecified: Secondary | ICD-10-CM

## 2016-02-17 DIAGNOSIS — Z950 Presence of cardiac pacemaker: Secondary | ICD-10-CM | POA: Diagnosis not present

## 2016-02-17 DIAGNOSIS — R42 Dizziness and giddiness: Secondary | ICD-10-CM

## 2016-02-17 DIAGNOSIS — N183 Chronic kidney disease, stage 3 (moderate): Secondary | ICD-10-CM | POA: Diagnosis not present

## 2016-02-17 DIAGNOSIS — K5900 Constipation, unspecified: Secondary | ICD-10-CM | POA: Diagnosis present

## 2016-02-17 DIAGNOSIS — J449 Chronic obstructive pulmonary disease, unspecified: Secondary | ICD-10-CM | POA: Diagnosis not present

## 2016-02-17 DIAGNOSIS — R5383 Other fatigue: Secondary | ICD-10-CM

## 2016-02-17 DIAGNOSIS — I252 Old myocardial infarction: Secondary | ICD-10-CM | POA: Diagnosis not present

## 2016-02-17 DIAGNOSIS — Z880 Allergy status to penicillin: Secondary | ICD-10-CM

## 2016-02-17 DIAGNOSIS — Z515 Encounter for palliative care: Secondary | ICD-10-CM

## 2016-02-17 DIAGNOSIS — F17200 Nicotine dependence, unspecified, uncomplicated: Secondary | ICD-10-CM

## 2016-02-17 DIAGNOSIS — N184 Chronic kidney disease, stage 4 (severe): Secondary | ICD-10-CM

## 2016-02-17 DIAGNOSIS — R1032 Left lower quadrant pain: Secondary | ICD-10-CM | POA: Diagnosis not present

## 2016-02-17 DIAGNOSIS — R630 Anorexia: Secondary | ICD-10-CM

## 2016-02-17 DIAGNOSIS — R338 Other retention of urine: Secondary | ICD-10-CM

## 2016-02-17 DIAGNOSIS — I255 Ischemic cardiomyopathy: Secondary | ICD-10-CM

## 2016-02-17 DIAGNOSIS — N401 Enlarged prostate with lower urinary tract symptoms: Secondary | ICD-10-CM

## 2016-02-17 DIAGNOSIS — R300 Dysuria: Secondary | ICD-10-CM

## 2016-02-17 DIAGNOSIS — E039 Hypothyroidism, unspecified: Secondary | ICD-10-CM | POA: Diagnosis not present

## 2016-02-17 DIAGNOSIS — Z888 Allergy status to other drugs, medicaments and biological substances status: Secondary | ICD-10-CM

## 2016-02-17 DIAGNOSIS — I130 Hypertensive heart and chronic kidney disease with heart failure and stage 1 through stage 4 chronic kidney disease, or unspecified chronic kidney disease: Secondary | ICD-10-CM | POA: Diagnosis not present

## 2016-02-17 DIAGNOSIS — Z7951 Long term (current) use of inhaled steroids: Secondary | ICD-10-CM

## 2016-02-17 DIAGNOSIS — F329 Major depressive disorder, single episode, unspecified: Secondary | ICD-10-CM | POA: Diagnosis not present

## 2016-02-17 DIAGNOSIS — E875 Hyperkalemia: Secondary | ICD-10-CM

## 2016-02-17 DIAGNOSIS — Z87440 Personal history of urinary (tract) infections: Secondary | ICD-10-CM

## 2016-02-17 DIAGNOSIS — I5022 Chronic systolic (congestive) heart failure: Secondary | ICD-10-CM | POA: Diagnosis not present

## 2016-02-17 LAB — URINALYSIS, ROUTINE W REFLEX MICROSCOPIC
BILIRUBIN URINE: NEGATIVE
Glucose, UA: NEGATIVE mg/dL
Hgb urine dipstick: NEGATIVE
KETONES UR: NEGATIVE mg/dL
LEUKOCYTES UA: NEGATIVE
NITRITE: NEGATIVE
PROTEIN: NEGATIVE mg/dL
Specific Gravity, Urine: 1.016 (ref 1.005–1.030)
pH: 6 (ref 5.0–8.0)

## 2016-02-17 LAB — BASIC METABOLIC PANEL
ANION GAP: 6 (ref 5–15)
BUN: 36 mg/dL — AB (ref 6–20)
CO2: 23 mmol/L (ref 22–32)
Calcium: 9 mg/dL (ref 8.9–10.3)
Chloride: 108 mmol/L (ref 101–111)
Creatinine, Ser: 2.47 mg/dL — ABNORMAL HIGH (ref 0.61–1.24)
GFR calc Af Amer: 28 mL/min — ABNORMAL LOW (ref >60)
GFR calc non Af Amer: 24 mL/min — ABNORMAL LOW (ref >60)
GLUCOSE: 99 mg/dL (ref 65–99)
POTASSIUM: 4.9 mmol/L (ref 3.5–5.1)
Sodium: 137 mmol/L (ref 135–145)

## 2016-02-17 LAB — CBC
HEMATOCRIT: 42 % (ref 39.0–52.0)
HEMOGLOBIN: 13.3 g/dL (ref 13.0–17.0)
MCH: 28.9 pg (ref 26.0–34.0)
MCHC: 31.7 g/dL (ref 30.0–36.0)
MCV: 91.1 fL (ref 78.0–100.0)
Platelets: 186 10*3/uL (ref 150–400)
RBC: 4.61 MIL/uL (ref 4.22–5.81)
RDW: 15 % (ref 11.5–15.5)
WBC: 6.3 10*3/uL (ref 4.0–10.5)

## 2016-02-17 MED ORDER — SODIUM CHLORIDE 0.9 % IV BOLUS (SEPSIS)
1000.0000 mL | Freq: Once | INTRAVENOUS | Status: AC
Start: 2016-02-17 — End: 2016-02-17
  Administered 2016-02-17: 1000 mL via INTRAVENOUS

## 2016-02-17 MED ORDER — BOOST PLUS PO LIQD
237.0000 mL | Freq: Three times a day (TID) | ORAL | Status: DC
  Administered 2016-02-18 – 2016-02-20 (×4): 237 mL via ORAL
  Filled 2016-02-17 (×12): qty 237

## 2016-02-17 MED ORDER — FLEET ENEMA 7-19 GM/118ML RE ENEM
1.0000 | ENEMA | Freq: Every day | RECTAL | Status: DC | PRN
  Administered 2016-02-19: 1 via RECTAL
  Filled 2016-02-17 (×2): qty 1

## 2016-02-17 MED ORDER — SODIUM CHLORIDE 0.9 % IV BOLUS (SEPSIS)
500.0000 mL | Freq: Once | INTRAVENOUS | Status: AC
  Administered 2016-02-17: 500 mL via INTRAVENOUS

## 2016-02-17 MED ORDER — SODIUM CHLORIDE 0.9 % IV BOLUS (SEPSIS): 500.0000 mL | Freq: Once | INTRAVENOUS | Status: DC

## 2016-02-17 MED ORDER — MIRTAZAPINE 15 MG PO TABS
15.0000 mg | ORAL_TABLET | Freq: Every day | ORAL | Status: DC
  Filled 2016-02-17: qty 1

## 2016-02-17 NOTE — Progress Notes (Signed)
Patient has refused enemas

## 2016-02-17 NOTE — Evaluation (Signed)
Physical Therapy Evaluation Patient Details Name: [name] MRN: [id number] DOB: 1946-01-05 Today's Date: 02/17/2016   History of Present Illness  Patient is a 70 yo male admitted 02/16/16 with abdominal pain, constipation/impacted stool, UTI, AKI.    PMH: Recent admit this month for colitis, ICM, EF 30%, Afib, pacemaker, CKD, COPD on O2 at home, HTN, CHF, chronic pain    Clinical Impression  Patient presents with problems listed below.  Will benefit from acute PT to maximize functional independence prior to discharge.  Recommended ST-SNF and patient declined as before.  Will recommend HHPT for continued therapy at discharge.    Follow Up Recommendations SNF - patient declined; Home health PT; Supervision for mobility/OOB    Equipment Recommendations  None recommended by PT    Recommendations for Other Services       Precautions / Restrictions Precautions Precautions: Fall Precaution Comments: Per patient, falls at home with no injury Restrictions Weight Bearing Restrictions: No      Mobility  Bed Mobility Overal bed mobility: Needs Assistance Bed Mobility: Supine to Sit;Sit to Supine     Supine to sit: Supervision Sit to supine: Modified independent (Device/Increase time)   General bed mobility comments: Patient able to move to sitting with no physical assist.  Patient reports dizziness on sitting - supervision for safety.  Resolved with time.  Patient sat EOB x 6 minutes with good balance.    Transfers                 General transfer comment: Patient declined further mobility due to fatigue and pain.  Ambulation/Gait                Stairs            Wheelchair Mobility    Modified Rankin (Stroke Patients Only)       Balance   Sitting-balance support: No upper extremity supported;Feet supported Sitting balance-Leahy Scale: Good                                       Pertinent Vitals/Pain Pain Assessment: 0-10 Pain  Score: 6  Pain Location: Bil hips Pain Descriptors / Indicators: Aching;Sore Pain Intervention(s): Limited activity within patient's tolerance;Monitored during session;Repositioned    Home Living Family/patient expects to be discharged to:: Private residence Living Arrangements: Alone Available Help at Discharge: Family;Friend(s);Available PRN/intermittently Type of Home: Apartment Home Access: Level entry;Elevator     Home Layout: One level Home Equipment: Walker - 4 wheels;Walker - 2 wheels;Cane - single point;Bedside commode;Wheelchair - Education administrator (comment);Cane - quad;Shower seat      Prior Function Level of Independence: Independent with assistive device(s)         Comments: Uses quad cane.  Per patient, continues to drive     Hand Dominance   Dominant Hand: Right    Extremity/Trunk Assessment   Upper Extremity Assessment: Defer to OT evaluation           Lower Extremity Assessment: Generalized weakness (h/o Bil THA; Decreased sensation bil feet)      Cervical / Trunk Assessment: Normal  Communication   Communication: HOH  Cognition Arousal/Alertness: Awake/alert Behavior During Therapy: WFL for tasks assessed/performed;Flat affect Overall Cognitive Status: Within Functional Limits for tasks assessed                      General Comments      Exercises  Assessment/Plan    PT Assessment Patient needs continued PT services  PT Problem List Decreased strength;Decreased activity tolerance;Decreased balance;Decreased mobility;Decreased knowledge of use of DME;Cardiopulmonary status limiting activity;Impaired sensation;Pain (Dizziness)          PT Treatment Interventions DME instruction;Gait training;Functional mobility training;Therapeutic activities;Therapeutic exercise;Patient/family education    PT Goals (Current goals can be found in the Care Plan section)  Acute Rehab PT Goals Patient Stated Goal: go home. no SNF PT Goal  Formulation: With patient Time For Goal Achievement: 02/24/16 Potential to Achieve Goals: Good    Frequency Min 3X/week   Barriers to discharge Decreased caregiver support Patient lives alone.  Limited assist available.    Co-evaluation               End of Session Equipment Utilized During Treatment: Oxygen Activity Tolerance: Patient limited by pain;Patient limited by fatigue Patient left: in bed;with call bell/phone within reach;with family/visitor present      Functional Assessment Tool Used: Clinical judgement Functional Limitation: Mobility: Walking and moving around Mobility: Walking and Moving Around Current Status [id number]): At least 20 percent but less than 40 percent impaired, limited or restricted Mobility: Walking and Moving Around Goal Status [phone]): At least 1 percent but less than 20 percent impaired, limited or restricted    Time: 1701-1712 PT Time Calculation (min) (ACUTE ONLY): 11 min   Charges:   PT Evaluation $PT Eval Moderate Complexity: 1 Procedure     PT G Codes:   PT G-Codes **NOT FOR INPATIENT CLASS** Functional Assessment Tool Used: Clinical judgement Functional Limitation: Mobility: Walking and moving around Mobility: Walking and Moving Around Current Status [id number]): At least 20 percent but less than 40 percent impaired, limited or restricted Mobility: Walking and Moving Around Goal Status [phone]): At least 1 percent but less than 20 percent impaired, limited or restricted    [name] 02/17/2016, 7:55 PM [name]. [name], [place] Pager [phone]

## 2016-02-17 NOTE — Progress Notes (Signed)
Initial Nutrition Assessment  DOCUMENTATION CODES:  Severe malnutrition in context of chronic illness, Underweight   Pt meets criteria for SEVERE MALNUTRITION in the context of Chronic Illness as evidenced by weight loss of >10% bw x 6 months and an oral intake that was estimated to have met < or equal to 75% needs for >or equal to 1 month  INTERVENTION:  Boost Plus TID. He prefers this over Ensure.   Believe pt's very poor PO appetite is related to body adaption to minimal intake over prolonged period.   If pt is a full code and wants "Everything Done", placement of PEG tube with nutrition support may be warranted to re nourish and needed to get patient "back on his feet"  NUTRITION DIAGNOSIS:  Inadequate oral intake related to poor appetite as evidenced by weight loss of >10% bw x 6 months and an oral intake that was estimated to have met < or equal to 75% needs for >or equal to 1 month  GOAL:  Patient will meet greater than or equal to 90% of their needs  MONITOR:  PO intake, Supplement acceptance, Weight trends, I & O's, Labs, GOC  REASON FOR ASSESSMENT:  Malnutrition Screening Tool    ASSESSMENT:  70 y/o COPD on O2, MDD, asbestos pleural disease, HTN, CHF, A. Fib on Eliquis, tachy-brady syndrome s/p pacemaker placement, BPH and CKD stage 3. Presented with constipation (2 weeks w/o bm), dizziness, poor PO intake. Pt was recently discharged from [facility] for colitis, atrial flutter and UTI.   Pt reports that he has poor PO intake since the beginning of the year. He eats <1 meal a day at home. He denies any n/v/d. He has severe constipation. He typically takes a stool softener in the morning and drinks prune juice at night. This hasnt helped recently.  He does not know why he has a poor appetite. He tried Marinol and remeron, but this did not help long term.  He drinks 2-3 Ensure supplements a day at home.    He gives his UBW as 158-169 lbs. His weight hx is difficult to trend due to  his fluid shifts. He was 145-150 the summer of 2016, but does appear to have regained up to 170 lbs? He appears to have lost >10% bw in 6 months and >7.5 % in 3 months.   He was agreeable to having Boost while admitted. He could not think of any foods that he was hungry for. There doesn't sound to be an issues with the food, rather it is his lack of appetite.   NFPE: Pt demonstrated widespread mild-moderate fat/muscle wasting, bordering on severe in certain areas.    Medications: Ensure Enlive BID, Vitamin D, Colace, ppi, remeron, senokot, IVF   Recent Labs Lab 02/16/16 2149 02/17/16 0543  NA 135 137  K 5.2* 4.9  CL 106 108  CO2 19* 23  BUN 36* 36*  CREATININE 2.75* 2.47*  CALCIUM 9.3 9.0  GLUCOSE 101* 99   Diet Order:  Diet regular Room service appropriate? Yes; Fluid consistency: Thin  Skin:  Dry Flaky  Last BM:  9/29- severe constipation  Height:  Ht Readings from Last 1 Encounters:  02/16/16 _0  (1.905 m)   Weight:  Wt Readings from Last 1 Encounters:  02/16/16 143 lb 12.8 oz (65.2 kg)   Wt Readings from Last 10 Encounters:  02/16/16 143 lb 12.8 oz (65.2 kg)  02/16/16 143 lb 4.8 oz (65 kg)  02/06/16 148 lb 1.6 oz (67.2 kg)  01/15/16 148 lb (67.1 kg)  12/17/15 148 lb 4.8 oz (67.3 kg)  11/15/15 157 lb 12.8 oz (71.6 kg)  11/09/15 158 lb (71.7 kg)  10/30/15 156 lb 9.6 oz (71 kg)  10/11/15 158 lb 14.4 oz (72.1 kg)  10/10/15 172 lb (78 kg)   Ideal Body Weight:  89.1 kg  BMI:  Body mass index is 17.97 kg/m.  Estimated Nutritional Needs:  Kcal:  2200-2400 (34-37 kcal/kg bw) Protein:  85-100 (1.3-1.5 g/kg bw) Fluid:  >2.2 liters Fluid  EDUCATION NEEDS:  No education needs identified at this time  Burtis Junes RD, LDN, [facility] Pager: [phone] 02/17/2016 3:57 PM

## 2016-02-17 NOTE — Progress Notes (Signed)
Met briefly with pt who requested that I come back another time. He gave me permission to call his dtr which I did. Appt set for [place] mtg 02/18/16 for 2pm Romona Curls, ANP

## 2016-02-17 NOTE — Progress Notes (Signed)
Subjective: Mr. was seen and evaluated at bedside. He is in no acute distress. He reports constipation but reports taking no over-the-counter medications. Patient also reports complaints of generalized weakness. Reports he is unable to take care of himself. Patient has reported this complaint in the past however has refused skilled nursing facility placement.  Objective:  Vital signs in last 24 hours: Vitals:   02/16/16 2047 02/17/16 0524 02/17/16 0856 02/17/16 0925  BP: 105/72 (!) 92/57 97/66   Pulse: 100 (!) 58 71   Resp: 18 16 16    Temp: 97.7 F (36.5 C) 98.4 F (36.9 C) 98.5 F (36.9 C)   TempSrc: Oral Oral Oral   SpO2: 100% 99% 100% 100%  Weight: 143 lb 12.8 oz (65.2 kg)     Height:       Gen.: This is a chronically ill appearing African-American male resting comfortably in bed. Appears cachectic. In no acute distress Cardiovascular: Irregularly irregular rhythm, no murmurs, rubs or gallops were auscultated Respiratory: Clear to auscultation bilaterally, without wheezes, rhonchi or crackles Abdomen: Soft, moderately tender to palpation in the right lower quadrant. Normoactive bowel sounds Extremities: Without edema. Warm  Assessment/Plan:  Active Problems:   Essential hypertension   Cardiac pacemaker in situ   COPD (chronic obstructive pulmonary disease) (HCC)   Depression   Acute kidney injury superimposed on chronic kidney disease (HCC)   Chronic combined systolic and diastolic congestive heart failure (HCC)   PVD (peripheral vascular disease) (HCC)   BPH (benign prostatic hyperplasia)   Longstanding persistent atrial fibrillation (HCC)   Chronic pain   Decreased appetite   Palliative care encounter   Constipation  Constipation, abdominal pain Patient here reports he has not had a bowel movement in 12 days and some right lower quadrant pain 2 days. Patient continued to report constipation despite trying no over-the-counter stool softeners or laxatives. He was  initially agreeable to enema however refused this treatment later on in the day. Patient has been scheduled senna and Colace. -Will reattempt enema later in the day -Continue senna and Colace -IV fluids.  Chronic Deconditioning Patient continues to have a sedentary lifestyle, poor by mouth intake, and muscle wasting. Patient has tried Marinol and Remeron and continues to have poor appetite. Reports discontinuing Remeron as it makes him drowsy. -Consults placed for nutrition, PT/OT. We appreciate the recommendations -Case management consultation for home health needs. Patient has consistently refused this in the past. Patient has also consistently refused SNF placement.  Atrial fibrillation Patient rate controlled on Cardizem 120 mg daily, Toprol XL 150 mg daily. He is anticoagulated with Eliquis 5 mg twice a day.  Dizziness This is a chronic complaint for the patient. Reports feeling syncopal however no symptoms of vertigo or actual syncopal episodes. This could be due to poor by mouth intake -Exertional support and IV fluids continued  COPD on oxygen therapy at home Patient is a current smoker with COPD. Uses Spiriva and albuterol at home as well as intermittent 3 L of oxygen use  -continue home regimen  CKD stage 3, acute exacerbation Creatinine 2.75 on admission baseline appears to be 1.4. Creatinine improved today at 2.47. This is likely caused by patient's poor by mouth intake as well as Lasix use. He received approximately 4 L of fluid yesterday. Patient does not appear volume overloaded.  Systolic Chronic Congestive heart failure Most recent echo in March 2017 showed an ejection fraction of 30%, however was in the setting of acute pulmonary edema. Patient does not  appear volume overloaded on exam -Hold Lasix -We'll monitor closely  Isobel Eisenhuth, DO 02/17/2016, 1:28 PM Pager: [phone]

## 2016-02-18 DIAGNOSIS — I252 Old myocardial infarction: Secondary | ICD-10-CM | POA: Diagnosis not present

## 2016-02-18 DIAGNOSIS — K5900 Constipation, unspecified: Secondary | ICD-10-CM | POA: Diagnosis not present

## 2016-02-18 DIAGNOSIS — N183 Chronic kidney disease, stage 3 (moderate): Secondary | ICD-10-CM | POA: Diagnosis not present

## 2016-02-18 DIAGNOSIS — I5022 Chronic systolic (congestive) heart failure: Secondary | ICD-10-CM | POA: Diagnosis not present

## 2016-02-18 DIAGNOSIS — I5042 Chronic combined systolic (congestive) and diastolic (congestive) heart failure: Secondary | ICD-10-CM | POA: Diagnosis not present

## 2016-02-18 DIAGNOSIS — Z950 Presence of cardiac pacemaker: Secondary | ICD-10-CM | POA: Diagnosis not present

## 2016-02-18 DIAGNOSIS — I130 Hypertensive heart and chronic kidney disease with heart failure and stage 1 through stage 4 chronic kidney disease, or unspecified chronic kidney disease: Secondary | ICD-10-CM | POA: Diagnosis not present

## 2016-02-18 DIAGNOSIS — Z515 Encounter for palliative care: Secondary | ICD-10-CM | POA: Diagnosis not present

## 2016-02-18 DIAGNOSIS — F329 Major depressive disorder, single episode, unspecified: Secondary | ICD-10-CM | POA: Diagnosis not present

## 2016-02-18 DIAGNOSIS — J449 Chronic obstructive pulmonary disease, unspecified: Secondary | ICD-10-CM | POA: Diagnosis not present

## 2016-02-18 DIAGNOSIS — E039 Hypothyroidism, unspecified: Secondary | ICD-10-CM | POA: Diagnosis not present

## 2016-02-18 DIAGNOSIS — E43 Unspecified severe protein-calorie malnutrition: Secondary | ICD-10-CM | POA: Insufficient documentation

## 2016-02-18 LAB — CBC
HEMATOCRIT: 40.3 % (ref 39.0–52.0)
HEMOGLOBIN: 12.9 g/dL — AB (ref 13.0–17.0)
MCH: 29.5 pg (ref 26.0–34.0)
MCHC: 32 g/dL (ref 30.0–36.0)
MCV: 92.2 fL (ref 78.0–100.0)
Platelets: 181 10*3/uL (ref 150–400)
RBC: 4.37 MIL/uL (ref 4.22–5.81)
RDW: 15.1 % (ref 11.5–15.5)
WBC: 7 10*3/uL (ref 4.0–10.5)

## 2016-02-18 LAB — URINE CULTURE

## 2016-02-18 LAB — BASIC METABOLIC PANEL
ANION GAP: 6 (ref 5–15)
BUN: 26 mg/dL — ABNORMAL HIGH (ref 6–20)
CO2: 22 mmol/L (ref 22–32)
Calcium: 9.2 mg/dL (ref 8.9–10.3)
Chloride: 110 mmol/L (ref 101–111)
Creatinine, Ser: 1.76 mg/dL — ABNORMAL HIGH (ref 0.61–1.24)
GFR calc Af Amer: 43 mL/min — ABNORMAL LOW (ref >60)
GFR, EST NON AFRICAN AMERICAN: 37 mL/min — AB (ref >60)
Glucose, Bld: 85 mg/dL (ref 65–99)
POTASSIUM: 5.5 mmol/L — AB (ref 3.5–5.1)
Sodium: 138 mmol/L (ref 135–145)

## 2016-02-18 MED ORDER — MIRTAZAPINE 15 MG PO TABS
30.0000 mg | ORAL_TABLET | Freq: Every day | ORAL | Status: DC
  Administered 2016-02-18 – 2016-02-19 (×2): 30 mg via ORAL
  Filled 2016-02-18 (×2): qty 2

## 2016-02-18 MED ORDER — MAGNESIUM CITRATE PO SOLN
1.0000 | Freq: Once | ORAL | Status: AC
  Administered 2016-02-18: 1 via ORAL
  Filled 2016-02-18: qty 296

## 2016-02-18 MED ORDER — SENNA 8.6 MG PO TABS
2.0000 | ORAL_TABLET | Freq: Two times a day (BID) | ORAL | Status: DC
  Administered 2016-02-18 – 2016-02-20 (×4): 17.2 mg via ORAL
  Filled 2016-02-18 (×4): qty 2

## 2016-02-18 MED ORDER — NICOTINE 7 MG/24HR TD PT24
7.0000 mg | MEDICATED_PATCH | TRANSDERMAL | Status: DC
  Administered 2016-02-18 – 2016-02-20 (×3): 7 mg via TRANSDERMAL
  Filled 2016-02-18 (×3): qty 1

## 2016-02-18 MED ORDER — BUPROPION HCL 75 MG PO TABS
75.0000 mg | ORAL_TABLET | Freq: Every day | ORAL | Status: DC
  Administered 2016-02-19 – 2016-02-20 (×2): 75 mg via ORAL
  Filled 2016-02-18 (×2): qty 1

## 2016-02-18 MED ORDER — POLYETHYLENE GLYCOL 3350 17 G PO PACK
17.0000 g | PACK | Freq: Every day | ORAL | Status: DC
  Administered 2016-02-18 – 2016-02-20 (×3): 17 g via ORAL
  Filled 2016-02-18 (×3): qty 1

## 2016-02-18 NOTE — Assessment & Plan Note (Signed)
In Afib with RVR (HR 110-120s) at time of this visit. BP in 70s/80s with significant dizziness and faint peripheral pulses. Endorses some chest pain and shortness of breath. States that he takes all his medications at home, but has not brought any in for verification. Also with significant constipation, loss of appetite and poor po intake.  Plan: - Will require rate control inpatient, telemetry - Encourage po intake, IVF as needed

## 2016-02-18 NOTE — Progress Notes (Signed)
Medicine attending: Clinical status discussed with resident physician Dr. Maryellen Pile. I concur with his evaluation and management plan.  Patient continues to refuse recommended medical therapy for his constipation. He refused an enema. He refused to talk with our palliative care team. He refuses to go to an assisted living facility. He has refused follow-up visits by the [facility] team. We are in a vicious circle situation with repetitive hospital admissions for a man who has little insight into his advanced medical problems. There are no ready solutions.

## 2016-02-18 NOTE — Consult Note (Signed)
Consultation Note Date: 02/18/2016   Patient Name: [name]  DOB: Nov 13, 1945  MRN: [id number]  Age / Sex: 70 y.o., male  PCP: Zada Finders, MD Referring Physician: Annia Belt, MD  Reason for Consultation: Establishing goals of care and Psychosocial/spiritual support  HPI/Patient Profile: 70 y.o. male  with past medical history of COPD, HTN, CHF, a. Fib, PM, anorexia, depression, colitis, AV thrombus, mesenteric artery thrombus admitted on 02/16/2016 with abdominal pain, and constipation. Pt reports no BM since 02/04/16. He has had frequent hospitalizations over the past 6 months .   Clinical Assessment and Goals of Care: Pt is A/O x3. Recognizes that he is having trouble taking care of all his needs at this point but does not want to enter ALF because of monetary issues. He is verbalizing wanting additional help in the home.  Met with daughter and 2 sons, nephew and DIL. They were hoping that Palliative Care would provide personal care assistance in the home for about 3-4 hours daily. They were not receptive to discussing [place] at this point  No HCPOA designated. 3 children. At this point pt can speak for himself    SUMMARY OF RECOMMENDATIONS   Full scope of treatment Pt ideally needs increased social support in order to have routine medical needs met CM consult in place; will see if CM can assess if he qualifies for personal care assistant Family not interested in anything other than full code, full aggressive care at this point Pt would like his Marinol increased but I do not see that he is on that currently?  Code Status/Advance Care Planning:  Full code    Symptom Management:   Back pain: Cont percocet PRN  Anorexia: Cont Remeron. Pt is requesting that Marinol be increased but I do not see that he has this ordered. He reports that he was on 2.2m BID  Depression: Would recommend DC  of Wellbutrin. This maybe negatively impacting his appetite. Increase Remeron 317mQHS  Constipation: Increase senna BID  Palliative Prophylaxis:   Bowel Regimen, Delirium Protocol, Frequent Pain Assessment and Palliative Wound Care  Additional Recommendations (Limitations, Scope, Preferences):  Full Scope Treatment  Psycho-social/Spiritual:   Desire for further Chaplaincy support:no  Additional Recommendations: Referral to Community Resources   Prognosis:   Unable to determine  Discharge Planning: Home with Home Health      Primary Diagnoses: Present on Admission: . Acute kidney injury superimposed on chronic kidney disease ([facility]. Chronic pain . Chronic combined systolic and diastolic congestive heart failure (H[place]. COPD (chronic obstructive pulmonary disease) ([facility]. Depression . Essential hypertension . Cardiac pacemaker in situ . Longstanding persistent atrial fibrillation ([facility]. BPH (benign prostatic hyperplasia) . Decreased appetite . PVD (peripheral vascular disease) ([facility]. Constipation   I have reviewed the medical record, interviewed the patient and family, and examined the patient. The following aspects are pertinent.  Past Medical History:  Diagnosis Date  . Alcohol abuse   . Anxiety   . Avascular necrosis of bones of both  hips [facility])    Status post bilateral hip replacements  . Bipolar disorder ([place])   . CHF (congestive heart failure) ([place])   . Chronic systolic heart failure ([place])    a. NICM - EF 35% with normal cors 2003. b. Last echo 11/2012 - EF 25-30%.  . CKD (chronic kidney disease)    Renal U/S 12/04/2009 showed no pathological findings. Labs 12/04/2009 include normal ESR, C3, C4; neg ANA; SPEP showed nonspecific increase in the alpha-2 region with no M-spike; UPEP showed no monoclonal free light chains; urine IFE showed polyclonal increase in feree Kappa and/or free Lambda light chains. Baseline Cr reported 1.7-2.5.  [name] COPD (chronic  obstructive pulmonary disease) ([place])   . Depression   . DVT (deep venous thrombosis) ([place])    He has hypercoagulability with multiple prior DVTs  . E coli bacteremia   . Erectile dysfunction   . Gastritis   . GERD (gastroesophageal reflux disease)   . HCAP (healthcare-associated pneumonia) 08/24/2015  . Hydrocele, unspecified   . Hyperlipemia   . Hypertension   . 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  Thyroid scan 08/28/2009 was normal with no focal areas of abnormal increased or decreased activity seen; the uptake of I 131 sodium iodide at 24 hours was 5.7%.  TSH and free T4 normalized by 08/16/2009.  . Intestinal obstruction   . Lung nodule    Chest CT scan on 12/14/2008 showed a nodular opacity at the left lung base felt to most likely represent scarring.  Follow-up chest CT scan on 06/02/2010 showed parenchymal scarring in the left apex, left  lower lobe, and lingula; some of this scarring at the left base had a nodular appearance, unchanged. Chest 09/2012 - stable.  . Noncompliance   . On home oxygen therapy    "3L periodically" (08/24/2015)  . Osteoarthritis   . Pleural plaque    H/o asbestos exposure. Chest CT on 06/02/2010 showed stable extensive calcified pleural plaques involving the left hemithorax, consistent with asbestos related pleural disease.  . Portal vein thrombosis   . Spondylosis   . STEMI (ST elevation myocardial infarction) ([place]) 10/11/2014  . Tachycardia-bradycardia syndrome [facility])    a. 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 b. St. Jude gen change 2010.  [name] Thrombocytopenia ([place])   . 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  . Weight loss    Normal colonoscopy by Dr. Olevia Perches on 11/06/2010.   Social History   Social History  . Marital status: Widowed    Spouse name: N/A  . Number of children: 10  . Years of education: N/A     Occupational History  . RETIRED Retired   Social History Main Topics  . Smoking status: Current Some Day Smoker    Packs/day: 0.25    Years: 54.00    Types: Cigarettes    Start date: 18    Last attempt to quit: 05/21/2015  . Smokeless tobacco: Current User     Comment: Will discussed with his provider this week 2 per day  . Alcohol use No     Comment: 6/22//2016 "h/o etoh abuse; stopped drinking ~ 2012"  . Drug use: No     Comment: smokes marijuana every other day  . Sexual activity: Not Currently   Other Topics Concern  . Not on file   Social History Narrative   Retired Architect Y81 yrs here  in [place]   Smoker 1 pack last 1.5 days tobacco, smokes marijuana    Denies EtOH x 4 years (on 10/12/12)   9 kids    From [place]   [place] Veteran    12 grade education             Family History  Problem Relation Age of Onset  . Hypertension Mother   . Asthma Mother   . Coronary artery disease Mother   . Cancer Mother   . Cancer Brother     Unsure of type.  . Stroke      mother  . Heart attack      brother and sister ?age   . Heart disease Brother   . Colon cancer Neg Hx   . Lung cancer Neg Hx   . Prostate cancer Neg Hx    Scheduled Meds: . apixaban  5 mg Oral BID  . aspirin  81 mg Oral Daily  . [START ON 02/19/2016] buPROPion  75 mg Oral Daily  . cholecalciferol  1,000 Units Oral Daily  . diltiazem  120 mg Oral Daily  . docusate sodium  100 mg Oral BID  . lactose free nutrition  237 mL Oral TID BM  . metoprolol succinate  150 mg Oral Daily  . mirtazapine  30 mg Oral QHS  . nicotine  7 mg Transdermal Q24H  . pantoprazole  40 mg Oral Daily  . polyethylene glycol  17 g Oral Daily  . rosuvastatin  20 mg Oral QHS  . senna  2 tablet Oral BID  . sodium chloride flush  3 mL Intravenous Q12H  . tiotropium  18 mcg Inhalation Daily   Continuous Infusions:  PRN Meds:.albuterol, oxyCODONE-acetaminophen **AND** oxyCODONE, sodium phosphate Medications Prior to  Admission:  Prior to Admission medications   Medication Sig Start Date End Date Taking? Authorizing Provider  albuterol (VENTOLIN HFA) 108 (90 BASE) MCG/ACT inhaler Inhale 2 puffs into the lungs every 6 (six) hours as needed for wheezing or shortness of breath. 01/06/15  Yes Nischal Dareen Piano, MD  apixaban (ELIQUIS) 5 MG TABS tablet Take 1 tablet (5 mg total) by mouth 2 (two) times daily. 10/03/15  Yes Norval Gable, MD  aspirin 81 MG chewable tablet Chew 1 tablet (81 mg total) by mouth daily. 11/17/14  Yes Riccardo Dubin, MD  buPROPion (WELLBUTRIN) 75 MG tablet Take 1 tablet (75 mg total) by mouth 2 (two) times daily. 10/08/15  Yes Norval Gable, MD  cholecalciferol (VITAMIN D) 1000 units tablet Take 1 tablet (1,000 Units total) by mouth daily. 10/08/15  Yes Norval Gable, MD  diltiazem (CARDIZEM CD) 120 MG 24 hr capsule Take 1 capsule (120 mg total) by mouth daily. 02/07/16  Yes Ophelia Shoulder, MD  diltiazem (CARDIZEM SR) 60 MG 12 hr capsule Take 60 mg by mouth 2 (two) times daily. 02/05/16  Yes Historical Provider, MD  dronabinol (MARINOL) 2.5 MG capsule Take 1 capsule (2.5 mg total) by mouth 2 (two) times daily before lunch and supper. 11/15/15  Yes Zada Finders, MD  furosemide (LASIX) 40 MG tablet Take 1 tablet (40 mg total) by mouth daily. 10/03/15  Yes Norval Gable, MD  meclizine (ANTIVERT) 25 MG tablet Take 25 mg by mouth daily.    Yes Historical Provider, MD  metoprolol (TOPROL-XL) 200 MG 24 hr tablet Take 200 mg by mouth daily. 02/05/16  Yes Historical Provider, MD  metoprolol succinate (TOPROL-XL) 100 MG 24 hr tablet Take 1 tablet (100  mg total) by mouth daily. Take with or immediately following a meal. 02/07/16  Yes Ophelia Shoulder, MD  metoprolol succinate (TOPROL-XL) 50 MG 24 hr tablet Take 1 tablet (50 mg total) by mouth daily. Take with or immediately following a meal. 02/07/16  Yes Ophelia Shoulder, MD  omeprazole (PRILOSEC) 40 MG capsule Take 1 capsule (40 mg total) by mouth daily.  10/08/15  Yes Norval Gable, MD  oxyCODONE-acetaminophen (PERCOCET) 10-325 MG tablet Take 1 tablet by mouth every 4 (four) hours as needed for pain. Do not take more than 5 tablets in 24 hours. 02/05/16 02/03/17 Yes Lucious Groves, DO  OXYGEN Inhale 3 L into the lungs as needed (shortness of breath).   Yes Historical Provider, MD  rosuvastatin (CRESTOR) 20 MG tablet Take 1 tablet (20 mg total) by mouth daily at 6 PM. Patient taking differently: Take 20 mg by mouth at bedtime.  10/08/15  Yes Norval Gable, MD  sulfamethoxazole-trimethoprim (BACTRIM DS,SEPTRA DS) 800-160 MG tablet Take 1 tablet by mouth every 12 (twelve) hours. 5 day course filled 02/05/16 02/05/16  Yes Historical Provider, MD  tiotropium (SPIRIVA) 18 MCG inhalation capsule Place 1 capsule (18 mcg total) into inhaler and inhale daily. 01/06/15  Yes Nischal Dareen Piano, MD  mirtazapine (REMERON) 30 MG tablet Take 1 tablet (30 mg total) by mouth at bedtime. Patient not taking: Reported on 02/17/2016 01/16/16   Zada Finders, MD  senna-docusate ([employer] SENNA PLUS) 8.6-50 MG tablet Take 1 tablet by mouth daily as needed for moderate constipation. Patient not taking: Reported on 02/17/2016 08/08/15   Zada Finders, MD   Allergies  Allergen Reactions  . Amiodarone Other (See Comments)    Thyroiditis in the setting of amiodarone use  . Penicillins Rash    Has patient had a PCN reaction causing immediate rash, facial/tongue/throat swelling, SOB or lightheadedness with hypotension: Yes Has patient had a PCN reaction causing severe rash involving mucus membranes or skin necrosis: No Has patient had a PCN reaction that required hospitalization No Has patient had a PCN reaction occurring within the last 10 years: No If all of the above answers are "NO", then may proceed with Cephalosporin use.   Review of Systems  Constitutional: Positive for activity change, appetite change and fatigue.  HENT: Negative.   Eyes: Negative.   Respiratory: Negative.    Gastrointestinal: Positive for constipation.  Endocrine: Negative.   Musculoskeletal: Positive for back pain.  Skin: Negative.   Allergic/Immunologic: Negative.   Neurological: Positive for weakness.  Hematological: Negative.   Psychiatric/Behavioral: Positive for dysphoric mood.    Physical Exam  Constitutional: He is oriented to person, place, and time.  Cachetic older man in no acute distress  HENT:  Head: Normocephalic and atraumatic.  Neck: Normal range of motion.  Pulmonary/Chest: Effort normal.  Musculoskeletal: Normal range of motion.  Neurological: He is alert and oriented to person, place, and time.  Skin: Skin is warm and dry.  Psychiatric: His behavior is normal.  Affect constricted  Nursing note and vitals reviewed.   Vital Signs: BP (!) 93/58 (BP Location: Left Arm)   Pulse (!) 51   Temp 97.8 F (36.6 C) (Oral)   Resp 17   Ht '6\' 3"'  (1.905 m)   Wt 66.2 kg (146 lb)   SpO2 100%   BMI 18.25 kg/m  Pain Assessment: 0-10   Pain Score: 2    SpO2: SpO2: 100 % O2 Device:SpO2: 100 % O2 Flow Rate: .O2 Flow Rate (L/min): 3 L/min  IO: Intake/output summary:  Intake/Output Summary (Last 24 hours) at 02/18/16 1512 Last data filed at 02/18/16 1506  Gross per 24 hour  Intake             1260 ml  Output             1400 ml  Net             -140 ml    LBM: Last BM Date: 02/16/16 Baseline Weight: Weight: 65 kg (143 lb 4.8 oz) Most recent weight: Weight: 66.2 kg (146 lb)     Palliative Assessment/Data:   Flowsheet Rows   Flowsheet Row Most Recent Value  Intake Tab  Referral Department  Hospitalist  Unit at Time of Referral  Med/Surg Unit  Palliative Care Primary Diagnosis  Other (Comment)  Date Notified  02/16/16  Palliative Care Type  New Palliative care  Reason for referral  Clarify Goals of Care  Date of Admission  02/16/16  Date first seen by Palliative Care  02/18/16  # of days Palliative referral response time  2 Day(s)  # of days IP prior to  Palliative referral  0  Clinical Assessment  Palliative Performance Scale Score  40%  Pain Max last 24 hours  Not able to report  Pain Min Last 24 hours  Not able to report  Dyspnea Max Last 24 Hours  Not able to report  Dyspnea Min Last 24 hours  Not able to report  Nausea Max Last 24 Hours  Not able to report  Nausea Min Last 24 Hours  Not able to report  Anxiety Max Last 24 Hours  Not able to report  Anxiety Min Last 24 Hours  Not able to report  Other Max Last 24 Hours  Not able to report  Psychosocial & Spiritual Assessment  Palliative Care Outcomes  Patient/Family meeting held?  Yes  Who was at the meeting?  2 sons, nephew, daughter and DIL  Palliative Care follow-up planned  No      Time In: 1400 Time Out: 1515 Time Total: 75 min Greater than 50%  of this time was spent counseling and coordinating care related to the above assessment and plan. Staffed with Family Medicine Resident  Signed by: Dory Horn, NP   Please contact Palliative Medicine Team phone at [phone] for questions and concerns.  For individual provider: See Shea Evans

## 2016-02-18 NOTE — Assessment & Plan Note (Signed)
Reports no BM since 9/17 (previous admission). When asked if he has taken laxatives or his senna-colace he states no.   Plan: - Inpatient, schedule senna-colace + enemas - Patient requires SNF/assisted living placement

## 2016-02-18 NOTE — Discharge Summary (Signed)
Name: [name] MRN: [id number] DOB: 1945-10-30 70 y.o. PCP: Zada Finders, MD  Date of Admission: 02/16/2016  5:34 PM Date of Discharge: 02/20/2016 Attending Physician: Dr. Michel Bickers   Discharge Diagnosis: Active Problems:   Essential hypertension   Cardiac pacemaker in situ   COPD (chronic obstructive pulmonary disease) ([place])   Depression   Acute kidney injury superimposed on chronic kidney disease ([place])   Chronic combined systolic and diastolic congestive heart failure (HCC)   PVD (peripheral vascular disease) (HCC)   BPH (benign prostatic hyperplasia)   Longstanding persistent atrial fibrillation (HCC)   Chronic pain   Decreased appetite   Palliative care encounter   Constipation   Protein-calorie malnutrition, severe  Discharge Medications:   Medication List    STOP taking these medications   diltiazem 60 MG 12 hr capsule Commonly known as:  CARDIZEM SR   sulfamethoxazole-trimethoprim 800-160 MG tablet Commonly known as:  BACTRIM DS,SEPTRA DS     TAKE these medications   albuterol 108 (90 Base) MCG/ACT inhaler Commonly known as:  VENTOLIN HFA Inhale 2 puffs into the lungs every 6 (six) hours as needed for wheezing or shortness of breath.   apixaban 5 MG Tabs tablet Commonly known as:  ELIQUIS Take 1 tablet (5 mg total) by mouth 2 (two) times daily.   aspirin 81 MG chewable tablet Chew 1 tablet (81 mg total) by mouth daily.   buPROPion 75 MG tablet Commonly known as:  WELLBUTRIN Take 1 tablet (75 mg total) by mouth 2 (two) times daily.   cholecalciferol 1000 units tablet Commonly known as:  VITAMIN D Take 1 tablet (1,000 Units total) by mouth daily.   diltiazem 120 MG 24 hr capsule Commonly known as:  CARDIZEM CD Take 1 capsule (120 mg total) by mouth daily.   docusate sodium 100 MG capsule Commonly known as:  COLACE Take 1 capsule (100 mg total) by mouth 2 (two) times daily.   dronabinol 2.5 MG capsule Commonly known as:  MARINOL Take 1  capsule (2.5 mg total) by mouth 2 (two) times daily before lunch and supper.   furosemide 40 MG tablet Commonly known as:  LASIX Take 1 tablet (40 mg total) by mouth daily.   meclizine 25 MG tablet Commonly known as:  ANTIVERT Take 25 mg by mouth daily.   metoprolol succinate 100 MG 24 hr tablet Commonly known as:  TOPROL-XL Take 1 tablet (100 mg total) by mouth daily. Take with or immediately following a meal. What changed:  Another medication with the same name was removed. Continue taking this medication, and follow the directions you see here.   metoprolol succinate 50 MG 24 hr tablet Commonly known as:  TOPROL-XL Take 1 tablet (50 mg total) by mouth daily. Take with or immediately following a meal. What changed:  Another medication with the same name was removed. Continue taking this medication, and follow the directions you see here.   mirtazapine 30 MG tablet Commonly known as:  REMERON Take 1 tablet (30 mg total) by mouth at bedtime.   omeprazole 40 MG capsule Commonly known as:  PRILOSEC Take 1 capsule (40 mg total) by mouth daily.   oxyCODONE-acetaminophen 10-325 MG tablet Commonly known as:  PERCOCET Take 1 tablet by mouth every 4 (four) hours as needed for pain. Do not take more than 5 tablets in 24 hours.   OXYGEN Inhale 3 L into the lungs as needed (shortness of breath).   rosuvastatin 20 MG tablet Commonly known as:  CRESTOR Take 1 tablet (20 mg total) by mouth daily at 6 PM. What changed:  when to take this   senna-docusate 8.6-50 MG tablet Commonly known as:  [employer] SENNA PLUS Take 1 tablet by mouth daily as needed for moderate constipation.   tiotropium 18 MCG inhalation capsule Commonly known as:  SPIRIVA Place 1 capsule (18 mcg total) into inhaler and inhale daily.       Disposition and follow-up:   [name] was discharged from [facility] in Good condition.  At the hospital follow up visit please address:  1.  Has he  had any problems with affording or taking medications?  2.  Labs / imaging needed at time of follow-up: BMET  3.  Pending labs/ test needing follow-up: none  Follow-up Appointments: Follow-up Information    [facility] .   Specialty:  Home Health Services Why:  Home Health RN, SW and aide Contact information: [place] [address] [phone]           Hospital Course by problem list: Active Problems:   Essential hypertension   Cardiac pacemaker in situ   COPD (chronic obstructive pulmonary disease) ([place])   Depression   Acute kidney injury superimposed on chronic kidney disease ([place])   Chronic combined systolic and diastolic congestive heart failure (HCC)   PVD (peripheral vascular disease) (HCC)   BPH (benign prostatic hyperplasia)   Longstanding persistent atrial fibrillation (HCC)   Chronic pain   Decreased appetite   Palliative care encounter   Constipation   Protein-calorie malnutrition, severe   Decreased bowel movements [name] is a 70yo male with PMH of COPD on O2, MDD, asbestos pleural disease, HTN, CHF, A. Fib on Eliquis, tachy-brady syndrome s/p pacemaker placement, BPH and CKD stage 3 presenting with symptoms of constipation, abdominal pain, dizziness and lack of appetite. He  presented with abdominal pain and decreased bowel movements for 12 days. Abdominal x-ray showed persistent large stool burden. He was started on IV fluids and senna and colace. He did not have a bowel movement initially in the hospitalization and was offered multiple enema treatments however he refused these treatments. Decreased bowel movements were thought to be related to chronic pain medication use, chronic constipation and decreased oral intake. He did have a bowel movement on the final day of hospitalization.   Paroxysmal atrial fibrillation  On presentation he was in atrial flutter with ventricular rate 101. Patient had not been compliant with medications  outpatient. His home medications Eliquis 5mg  BID for anticoagulation; metoprolol 150mg  daily and cardizem 120mg  daily were started and achieved rate control.  Social Issues  [name] and his family agreed to meet with palliative care. During this meeting they were found to have unrealistic goals and wanted to continue with full scope treatment. Case management had set up extensive home help nurse, physical therapy and social work after previous admission, these were all continued for this admission as he refused SNF placement.   CKD stage 3, acute exacerbation Creatinine 2.75 on admission baseline appears to be 1.4. Creatinine improved to 1.5 in response to IV fluids and oral intake of fluids.   Discharge Vitals:   BP 107/66 (BP Location: Left Arm)   Pulse 61   Temp 97.8 F (36.6 C) (Oral)   Resp 18   Ht 6\' 3"  (1.905 m)   Wt 146 lb (66.2 kg)   SpO2 100%   BMI 18.25 kg/m   Pertinent Labs, Studies,  and Procedures:    Discharge Instructions: Discharge Instructions    Call MD for:  difficulty breathing, headache or visual disturbances    Complete by:  As directed    Call MD for:  extreme fatigue    Complete by:  As directed    Call MD for:  hives    Complete by:  As directed    Call MD for:  persistant dizziness or light-headedness    Complete by:  As directed    Call MD for:  redness, tenderness, or signs of infection (pain, swelling, redness, odor or green/yellow discharge around incision site)    Complete by:  As directed    Call MD for:  temperature >100.4    Complete by:  As directed    Diet - low sodium heart healthy    Complete by:  As directed    Diet - low sodium heart healthy    Complete by:  As directed    Increase activity slowly    Complete by:  As directed    Increase activity slowly    Complete by:  As directed       Signed: Ledell Noss, MD 02/20/2016 Pager: [phone]

## 2016-02-18 NOTE — Progress Notes (Signed)
Subjective: Patient with no complaints today. Reports no bowel movements. Refused enema yesterday. Unable to take care of himself at home. Refuses SNF placement and declines Home Health or [facility] services. Palliative was asked to see the patient and he refused to see them yesterday. GOC discussed scheduled for this afternoon.   Objective: Vital signs in last 24 hours: Vitals:   02/18/16 0023 02/18/16 0630 02/18/16 0946 02/18/16 1003  BP: 95/63 100/62  (!) 93/58  Pulse: (!) 57 (!) 58  (!) 51  Resp:  16  17  Temp:  98.4 F (36.9 C)  97.8 F (36.6 C)  TempSrc:  Oral  Oral  SpO2:  100% 100% 100%  Weight:      Height:       Weight change: 2 lb 11.2 oz (1.225 kg)  Intake/Output Summary (Last 24 hours) at 02/18/16 1436 Last data filed at 02/18/16 1220  Gross per 24 hour  Intake             1020 ml  Output             1400 ml  Net             -380 ml    Gen.: This is a chronically ill appearing African-American male resting comfortably in bed. Appears cachectic. In no acute distress Cardiovascular: Irregularly irregular rhythm, no murmurs, rubs or gallops were auscultated Respiratory: Clear to auscultation bilaterally, without wheezes, rhonchi or crackles Abdomen: Soft, moderately tender to palpation in the right lower quadrant. Normoactive bowel sounds Extremities: Without edema. Warm  Medications: I have reviewed the patient's current medications. Scheduled Meds: . apixaban  5 mg Oral BID  . aspirin  81 mg Oral Daily  . buPROPion  75 mg Oral BID  . cholecalciferol  1,000 Units Oral Daily  . diltiazem  120 mg Oral Daily  . docusate sodium  100 mg Oral BID  . lactose free nutrition  237 mL Oral TID BM  . metoprolol succinate  150 mg Oral Daily  . mirtazapine  15 mg Oral QHS  . nicotine  7 mg Transdermal Q24H  . pantoprazole  40 mg Oral Daily  . polyethylene glycol  17 g Oral Daily  . rosuvastatin  20 mg Oral QHS  . senna  1 tablet Oral BID  . sodium chloride flush  3 mL  Intravenous Q12H  . tiotropium  18 mcg Inhalation Daily   Continuous Infusions:  PRN Meds:.albuterol, oxyCODONE-acetaminophen **AND** oxyCODONE, sodium phosphate Assessment/Plan:  Constipation, abdominal pain Patient here reports he has not had a bowel movement in 12 days and some right lower quadrant pain 2 days. Patient continued to report constipation despite trying no over-the-counter stool softeners or laxatives. He was initially agreeable to enema however refused this treatment later on in the day yesterday. Patient has been scheduled senna and Colace. Agreed to miralax today and reports being agreeable to enema if still unable to have a bowel movement.  -Will reattempt enema later in the day if unable to have BM - Miralax  -Continue senna and Colace -IV fluids -PT recommending SNF, patient refusing. [place] today  Chronic Deconditioning Patient continues to have a sedentary lifestyle, poor by mouth intake, and muscle wasting. Patient has tried Marinol and Remeron and continues to have poor appetite. Reports discontinuing Remeron as it makes him drowsy. -Consults placed for nutrition, PT/OT. We appreciate the recommendations -Case management consultation for home health needs. Patient has consistently refused this in the  past. Patient has also consistently refused SNF placement.  Atrial fibrillation Patient rate controlled on Cardizem 120 mg daily, Toprol XL 150 mg daily. He is anticoagulated with Eliquis 5 mg twice a day.  Dizziness This is a chronic complaint for the patient. Reports feeling syncopal however no symptoms of vertigo or actual syncopal episodes. This could be due to poor by mouth intake -Exertional support and IV fluids continued  COPD on oxygen therapy at home Patient is a current smoker with COPD. Uses Spiriva and albuterol at home as well as intermittent 3 L of oxygen use  -continue home regimen  CKD stage 3, acute exacerbation Creatinine  2.75 on admission baseline appears to be 1.4. Creatinine improved today at 1.76. This is likely caused by patient's poor by mouth intake as well as Lasix use.  Systolic Chronic Congestive heart failure Most recent echo in March 2017 showed an ejection fraction of 30%, however was in the setting of acute pulmonary edema. Patient does not appear volume overloaded on exam -Hold Lasix -We'll monitor closely  Dispo: Disposition is deferred at this time, awaiting improvement of current medical problems.  Anticipated discharge in approximately 1 day(s).   The patient does have a current PCP (Zada Finders, MD) and does need an [facility] hospital follow-up appointment after discharge.  The patient does not have transportation limitations that hinder transportation to clinic appointments.  .Services Needed at time of discharge: Y = Yes, Blank = No PT:   OT:   RN:   Equipment:   Other:     LOS: 1 day   Maryellen Pile, MD IMTS PGY-2 [phone] 02/18/2016, 2:36 PM

## 2016-02-19 ENCOUNTER — Encounter (HOSPITAL_COMMUNITY): Payer: Self-pay

## 2016-02-19 ENCOUNTER — Other Ambulatory Visit: Payer: Self-pay | Admitting: *Deleted

## 2016-02-19 ENCOUNTER — Ambulatory Visit: Payer: Self-pay | Admitting: *Deleted

## 2016-02-19 DIAGNOSIS — N19 Unspecified kidney failure: Secondary | ICD-10-CM | POA: Diagnosis not present

## 2016-02-19 DIAGNOSIS — E039 Hypothyroidism, unspecified: Secondary | ICD-10-CM | POA: Diagnosis not present

## 2016-02-19 DIAGNOSIS — J449 Chronic obstructive pulmonary disease, unspecified: Secondary | ICD-10-CM | POA: Diagnosis not present

## 2016-02-19 DIAGNOSIS — F329 Major depressive disorder, single episode, unspecified: Secondary | ICD-10-CM | POA: Diagnosis not present

## 2016-02-19 DIAGNOSIS — I252 Old myocardial infarction: Secondary | ICD-10-CM | POA: Diagnosis not present

## 2016-02-19 DIAGNOSIS — N183 Chronic kidney disease, stage 3 (moderate): Secondary | ICD-10-CM | POA: Diagnosis not present

## 2016-02-19 DIAGNOSIS — I130 Hypertensive heart and chronic kidney disease with heart failure and stage 1 through stage 4 chronic kidney disease, or unspecified chronic kidney disease: Secondary | ICD-10-CM | POA: Diagnosis not present

## 2016-02-19 DIAGNOSIS — I5022 Chronic systolic (congestive) heart failure: Secondary | ICD-10-CM | POA: Diagnosis not present

## 2016-02-19 DIAGNOSIS — J129 Viral pneumonia, unspecified: Secondary | ICD-10-CM | POA: Diagnosis not present

## 2016-02-19 DIAGNOSIS — I4891 Unspecified atrial fibrillation: Secondary | ICD-10-CM | POA: Diagnosis not present

## 2016-02-19 DIAGNOSIS — K5900 Constipation, unspecified: Secondary | ICD-10-CM | POA: Diagnosis not present

## 2016-02-19 DIAGNOSIS — Z950 Presence of cardiac pacemaker: Secondary | ICD-10-CM | POA: Diagnosis not present

## 2016-02-19 LAB — BASIC METABOLIC PANEL
Anion gap: 7 (ref 5–15)
BUN: 24 mg/dL — AB (ref 6–20)
CHLORIDE: 112 mmol/L — AB (ref 101–111)
CO2: 22 mmol/L (ref 22–32)
Calcium: 9.3 mg/dL (ref 8.9–10.3)
Creatinine, Ser: 1.54 mg/dL — ABNORMAL HIGH (ref 0.61–1.24)
GFR calc non Af Amer: 43 mL/min — ABNORMAL LOW (ref >60)
GFR, EST AFRICAN AMERICAN: 50 mL/min — AB (ref >60)
Glucose, Bld: 87 mg/dL (ref 65–99)
POTASSIUM: 5.4 mmol/L — AB (ref 3.5–5.1)
SODIUM: 141 mmol/L (ref 135–145)

## 2016-02-19 MED ORDER — DOCUSATE SODIUM 100 MG PO CAPS: 100.0000 mg | ORAL_CAPSULE | Freq: Two times a day (BID) | ORAL | 0 refills | Status: DC

## 2016-02-19 NOTE — Care Management Note (Signed)
Case Management Note  Patient Details  Name: [name] MRN: [id number] Date of Birth: 1945-12-03  Subjective/Objective: Pt presented for AKI. Pt is from home and is active with [facility] for [facility], [place], [place] and A[facility] Services. Pt is being followed by [facility] for community resources and this CM did make referral to [facility] on last visit.                    Action/Plan:CM did ask patient if she could reach out to daughter, he stated no he would speak with daughter. Pt is refusing SNF at this time and he stated he would like to return home. MD please place resumption order for [facility] Services. No DME needs at this time. CM did speak with MD Hetty Ely in regards to d/c. Pt states he is not ready to leave. CM did provide pt with an ABN to make him aware that if he does not leave on 02-19-16 he will be faced with a bill-Dr. Hetty Ely is aware. CM did ask pt if he had transportation and he said not today. CM did speak with CSW to see if they can assist with transportation. CM did make referral to Liaison [name] in regards to [facility] Services with [facility]. CM had previously provided pt with information in regards to [employer] Whe[employer] and [place] [employer] which is an out of pocket expense. CM did ask [place] to assist with the paperwork for Meals on Wheels. [facility] is following as well. No further needs from this CM.    Expected Discharge Date:                  Expected Discharge Plan:  [place]  In-House Referral:  NA  Discharge planning Services  CM Consult  Post Acute Care Choice:  Resumption of Svcs/PTA Provider, Home Health Choice offered to:     DME Arranged:  N/A DME Agency:  NA  HH Arranged:  RN, PT, Social Work, Nurse's Aide [place] Agency:  [place]  Status of Service:  Completed, signed off  If discussed at [employer], dates discussed:    Additional Comments:  Bethena Roys, RN 02/19/2016, 4:22 PM

## 2016-02-19 NOTE — Progress Notes (Signed)
PT Cancellation Note  Patient Details Name: [name] MRN: [id number] DOB: 08-Dec-1945   Cancelled Treatment:    Reason Eval/Treat Not Completed: Other (comment)   Tells me he is awaiting an enema and would rather not walk at this time;   Roney Marion, [place] Pager [phone] Office [phone]    Roney Marion [facility] 02/19/2016, 4:23 PM

## 2016-02-19 NOTE — Consult Note (Signed)
   [facility] Inpatient Consult   02/19/2016  [name] [birth year] [id number]   Patient is currently active with [place] [facility] for chronic disease management services.  Patient has been hospitalized 6 times in the past 6 months and currently under observation status noted.   Patient has been engaged by a [employer] and CSW in the past and had been referred to [place] program.  Call received from patient [place] Coordinator who states, [facility] is awaiting approval for services from his primary care provider.    Our community based plan of care has focused on disease management and community resource support.  Ongoing [facility] Care Management is planned per nurse for [facility].  Of note, [facility] Care Management services does not replace or interfere with any services that are needed or arranged by inpatient case management or social work.  For additional questions or referrals please contact:  Natividad Brood, RN BSN [facility] Liaison  [phone] business mobile phone Toll free office [phone]

## 2016-02-19 NOTE — Plan of Care (Signed)
Problem: Pain Managment: Goal: General experience of comfort will improve Outcome: Progressing Relief from constipation   Problem: Bowel/Gastric: Goal: Will not experience complications related to bowel motility Outcome: Completed/Met Date Met: 02/19/16 Patient had bowl movement today

## 2016-02-19 NOTE — Care Management Obs Status (Signed)
[place] NOTIFICATION   Patient Details  Name: MEDHANSH SPANGLER MRN: [id number] Date of Birth: Nov 26, 1945   Medicare Observation Status Notification Given:  Yes    Bethena Roys, RN 02/19/2016, 12:53 PM

## 2016-02-19 NOTE — Progress Notes (Signed)
OT Cancellation Note  Patient Details Name: [name] MRN: [id number] DOB: December 30, 1945   Cancelled Treatment:    Reason Eval/Treat Not Completed: Other (comment).. Pt opened his eyes to see who I was and said he was not going to do any therapy this morning, "they gave me that pill last night and I can hardly hold my eyes open". Will return at a later time as schedule allows.  Almon Register [id number] 02/19/2016, 9:19 AM

## 2016-02-19 NOTE — Patient Outreach (Signed)
[facility]) Care Management  02/19/2016  [name] 1945/12/05 [id number]   RN spoke with hospital liaison Natividad Brood) who verified pt has underwent a Palliative Care consult and discharge plans are for pt to go home with [facility].   RN also contacted [place] who was pending approval via Dr. [name] on a consult. RN spoke with Burman Nieves concerning pt's recent observation admission pending discharge via [facility] and inquired if an approval vis primary provider for a consult. Message will be delivered to Manus Gunning (rep with [facility]) and confirm  [facility] will be involved. Will continue to communicate with this agency for pending consultation.  RN will also continue transition of care for ongoing [facility] involvement and inquired on possible home visit if allowed upon pt's returning to his home.  [name], RN Care Management Coordinator [place] Office [phone]

## 2016-02-19 NOTE — Progress Notes (Signed)
Patient has medicines from home in his closet. RN was informing him that medicine has to be secured in pharmacy but patient got so upset and mad telling RN to "leave it alone and get out,I'm not awake yet.I'll have somebody get it." Incoming RN aware. [place] Lase[place], Wonda Cheng, Therapist, sports

## 2016-02-19 NOTE — Progress Notes (Signed)
         Date of Admission:  02/16/2016     I have seen and examined [name] and discussed his care with Dr. Hetty Ely, Mr. [facility] nurse and case manager. [name] is complaining of chronic constipation but has been refusing enemas and physical therapy. He refuses to consider skilled nursing facility placement. He is requesting that we arrange for someone to come in and clean his house, by food and prepare his meals. I told him that this is an unrealistic expectation. His case manager has arranged a referral to [facility] home palliative care program. He will also continue to work with [facility] care management team. He has no current indication for continued hospitalization. I agree with discharge home this evening.         Travis Bickers, MD [facility] [place] [facility] [phone] pager   [phone] cell

## 2016-02-19 NOTE — Discharge Instructions (Signed)

## 2016-02-19 NOTE — Progress Notes (Signed)
Subjective: [name] was sleeping peacefully when seen during rounds. He continues to complain of pain all over his body but he was able to sleep very deeply overnight and this morning to the point that he finds it difficult to open his eyes or move around for medications or conversation. He says the pain he is experiencing is generalized and he can not locate one area where it is the worst such as his abdomen. He says he has had no bowel movements over night and although he refused enema yesterday he says that today he will comply. He was able to finish a full plate of food for breakfast this morning. He denies any new concerns or complaints    Objective:  Vital signs in last 24 hours: Vitals:   02/18/16 2059 02/19/16 0615 02/19/16 0840 02/19/16 1116  BP: 93/67 (!) 90/56 95/62 95/62   Pulse: 72 73 64 73  Resp: 16 15 18    Temp: 98.4 F (36.9 C) 98.3 F (36.8 C) 97.8 F (36.6 C)   TempSrc: Oral Oral Oral   SpO2: 100% 100% 100%   Weight:      Height:       Physical Exam  Constitutional: He appears well-developed and well-nourished.  Cardiovascular: Normal rate and regular rhythm.   No murmur heard. Pulmonary/Chest: Breath sounds normal. He has no wheezes. He has no rales.  Abdominal: Soft. He exhibits no distension. There is no tenderness. There is no guarding.  Extremities: no calf tenderness, no peripheral edema   Labs: Metabolic Panel:  Recent Labs Lab 02/16/16 2149 02/17/16 0543 02/18/16 0600 02/19/16 0851  NA 135 137 138 141  K 5.2* 4.9 5.5* 5.4*  CL 106 108 110 112*  CO2 19* 23 22 22   GLUCOSE 101* 99 85 87  BUN 36* 36* 26* 24*  CREATININE 2.75* 2.47* 1.76* 1.54*  CALCIUM 9.3 9.0 9.2 9.3  ALT 13*  --   --   --   ALKPHOS 60  --   --   --   BILITOT 0.8  --   --   --   PROT 6.7  --   --   --   ALBUMIN 3.5  --   --   --   LABPROT 14.9  --   --   --   INR 1.17  --   --   --     Medications: Infusions:   Scheduled Medications: . apixaban  5 mg Oral  BID  . aspirin  81 mg Oral Daily  . buPROPion  75 mg Oral Daily  . cholecalciferol  1,000 Units Oral Daily  . diltiazem  120 mg Oral Daily  . docusate sodium  100 mg Oral BID  . lactose free nutrition  237 mL Oral TID BM  . metoprolol succinate  150 mg Oral Daily  . mirtazapine  30 mg Oral QHS  . nicotine  7 mg Transdermal Q24H  . pantoprazole  40 mg Oral Daily  . polyethylene glycol  17 g Oral Daily  . rosuvastatin  20 mg Oral QHS  . senna  2 tablet Oral BID  . sodium chloride flush  3 mL Intravenous Q12H  . tiotropium  18 mcg Inhalation Daily   PRN Medications: albuterol, oxyCODONE-acetaminophen **AND** oxyCODONE, sodium phosphate  Assessment/Plan: Pt is a 70 y.o. yo male with a PMHx of afib, systolic CHF, tachy brady syndrome s/p pacemaker, asbestosis, GERD, COPD, HTN who was admitted on 02/16/2016 with symptoms of constipation and  abdominal pain, which was determined to be secondary to decreased food comsumption.   Active Problems:   Essential hypertension   Cardiac pacemaker in situ   COPD (chronic obstructive pulmonary disease) (HCC)   Depression   Acute kidney injury superimposed on chronic kidney disease (HCC)   Chronic combined systolic and diastolic congestive heart failure (HCC)   PVD (peripheral vascular disease) (HCC)   BPH (benign prostatic hyperplasia)   Longstanding persistent atrial fibrillation (HCC)   Chronic pain   Decreased appetite   Palliative care encounter   Constipation   Protein-calorie malnutrition, severe  constipation  He denies any bowel movement overnight but has repeatedly refused enema. Today he states he would like the enema but says that he may or may not be ready when the nurse comes in to give it.  - will try enema this morning  - continue senna docusate and colace   Atrial fibrillation Patient rate controlled on Cardizem 120 mg daily, Toprol XL 150 mg daily. He is anticoagulated with Eliquis 5 mg twice a day.  COPD on oxygen  therapy at home Patient is a current smoker with COPD. Uses Spiriva and albuterol at home as well as intermittent 3 L of oxygen use  -continue home regimen  CKD stage 3, acute exacerbation Creatinine 2.75 on admission baseline appears to be 1.4. Creatinine improved today at 1.5 in response to IV fluids and oral intake of fluids   Systolic Chronic Congestive heart failure Most recent echo in March 2017 showed an ejection fraction of 30%, however was in the setting of acute pulmonary edema. Patient does not appear volume overloaded on exam -Hold Lasix -We'll monitor closely  Dispo: Anticipated discharge today  LOS: 1 day   Ledell Noss, MD 02/19/2016, 5:40 PM Pager: [phone]

## 2016-02-19 NOTE — Progress Notes (Signed)
Patient refused d/c orders stating he would not have a ride until lunch time tomorrow. Doctor notified, plans to talk with patient tomorrow morning. Leone Brand, RN

## 2016-02-20 ENCOUNTER — Telehealth: Payer: Self-pay | Admitting: Internal Medicine

## 2016-02-20 DIAGNOSIS — E039 Hypothyroidism, unspecified: Secondary | ICD-10-CM | POA: Diagnosis not present

## 2016-02-20 DIAGNOSIS — G8929 Other chronic pain: Secondary | ICD-10-CM

## 2016-02-20 DIAGNOSIS — J449 Chronic obstructive pulmonary disease, unspecified: Secondary | ICD-10-CM | POA: Diagnosis not present

## 2016-02-20 DIAGNOSIS — I130 Hypertensive heart and chronic kidney disease with heart failure and stage 1 through stage 4 chronic kidney disease, or unspecified chronic kidney disease: Secondary | ICD-10-CM | POA: Diagnosis not present

## 2016-02-20 DIAGNOSIS — E43 Unspecified severe protein-calorie malnutrition: Secondary | ICD-10-CM

## 2016-02-20 DIAGNOSIS — Z9981 Dependence on supplemental oxygen: Secondary | ICD-10-CM

## 2016-02-20 DIAGNOSIS — Z79899 Other long term (current) drug therapy: Secondary | ICD-10-CM

## 2016-02-20 DIAGNOSIS — N183 Chronic kidney disease, stage 3 (moderate): Secondary | ICD-10-CM | POA: Diagnosis not present

## 2016-02-20 DIAGNOSIS — Z950 Presence of cardiac pacemaker: Secondary | ICD-10-CM

## 2016-02-20 DIAGNOSIS — I739 Peripheral vascular disease, unspecified: Secondary | ICD-10-CM

## 2016-02-20 DIAGNOSIS — N179 Acute kidney failure, unspecified: Secondary | ICD-10-CM

## 2016-02-20 DIAGNOSIS — K5900 Constipation, unspecified: Secondary | ICD-10-CM | POA: Diagnosis not present

## 2016-02-20 DIAGNOSIS — I480 Paroxysmal atrial fibrillation: Secondary | ICD-10-CM

## 2016-02-20 DIAGNOSIS — I5022 Chronic systolic (congestive) heart failure: Secondary | ICD-10-CM | POA: Diagnosis not present

## 2016-02-20 DIAGNOSIS — F329 Major depressive disorder, single episode, unspecified: Secondary | ICD-10-CM | POA: Diagnosis not present

## 2016-02-20 DIAGNOSIS — Z7901 Long term (current) use of anticoagulants: Secondary | ICD-10-CM

## 2016-02-20 DIAGNOSIS — N400 Enlarged prostate without lower urinary tract symptoms: Secondary | ICD-10-CM

## 2016-02-20 DIAGNOSIS — Z608 Other problems related to social environment: Secondary | ICD-10-CM

## 2016-02-20 DIAGNOSIS — I252 Old myocardial infarction: Secondary | ICD-10-CM | POA: Diagnosis not present

## 2016-02-20 MED ORDER — OXYCODONE-ACETAMINOPHEN 5-325 MG PO TABS: 1.0000 | ORAL_TABLET | Freq: Three times a day (TID) | ORAL | Status: DC | PRN

## 2016-02-20 MED ORDER — OXYCODONE HCL 5 MG PO TABS: 5.0000 mg | ORAL_TABLET | Freq: Four times a day (QID) | ORAL | Status: DC | PRN

## 2016-02-20 NOTE — Telephone Encounter (Signed)
Called fran and gave verbal for palliative care Also called tanya and gave verbal for OT and PCS

## 2016-02-20 NOTE — Telephone Encounter (Signed)
Calling asking for verbal order to enroll patient in palliative care program

## 2016-02-20 NOTE — Progress Notes (Signed)
Subjective: [name] denies any new concerns or complaints today. He denies any major pain and says he is feeling fine and ready to go home at noon today. He states that last night he did not feel like leaving the hospital so that is why he didn't go home.   [name] had a bowel movement yesterday. His decreased bowel movements were most likely related to his decreased oral intake. He was medically cleared for discharge yesterday but refused to leave the hospital.   Objective:  Vital signs in last 24 hours: Vitals:   02/19/16 0840 02/19/16 1116 02/19/16 1949 02/20/16 0500  BP: 95/62 95/62 111/81 113/66  Pulse: 64 73 65 (!) 113  Resp: 18  16 18   Temp: 97.8 F (36.6 C)  98 F (36.7 C) 98.3 F (36.8 C)  TempSrc: Oral  Oral Oral  SpO2: 100%  100% 100%  Weight:      Height:       Physical Exam  Constitutional: He appears well-developed and well-nourished.  Cardiovascular: Normal rate.  An irregularly irregular rhythm present.  No murmur heard. Pulmonary/Chest: Breath sounds normal. He has no wheezes. He has no rales.  Abdominal: Soft. He exhibits no distension. There is no tenderness. There is no guarding.   Extremities: no calf tenderness, no peripheral edema   Medications: Infusions:   Scheduled Medications: . apixaban  5 mg Oral BID  . aspirin  81 mg Oral Daily  . buPROPion  75 mg Oral Daily  . cholecalciferol  1,000 Units Oral Daily  . diltiazem  120 mg Oral Daily  . docusate sodium  100 mg Oral BID  . lactose free nutrition  237 mL Oral TID BM  . metoprolol succinate  150 mg Oral Daily  . mirtazapine  30 mg Oral QHS  . nicotine  7 mg Transdermal Q24H  . pantoprazole  40 mg Oral Daily  . polyethylene glycol  17 g Oral Daily  . rosuvastatin  20 mg Oral QHS  . senna  2 tablet Oral BID  . sodium chloride flush  3 mL Intravenous Q12H  . tiotropium  18 mcg Inhalation Daily   PRN Medications: albuterol, oxyCODONE-acetaminophen **AND** oxyCODONE, sodium  phosphate  Assessment/Plan: Pt is a 70 y.o. yo male with a PMHx of afib, systolic CHF, tachy brady syndrome s/p pacemaker, asbestosis, GERD, COPD, HTN who was admitted on 02/16/2016 with symptoms of constipation and abdominal pain, which was determined to be secondary to decreased food comsumption.   Active Problems:   Essential hypertension   Cardiac pacemaker in situ   COPD (chronic obstructive pulmonary disease) (HCC)   Depression   Acute kidney injury superimposed on chronic kidney disease (HCC)   Chronic combined systolic and diastolic congestive heart failure (HCC)   PVD (peripheral vascular disease) (HCC)   BPH (benign prostatic hyperplasia)   Longstanding persistent atrial fibrillation (HCC)   Chronic pain   Decreased appetite   Palliative care encounter   Constipation   Protein-calorie malnutrition, severe  Decreased bowel movements  He had a bowel movement overnight.  - continue senna docusate and colace   Atrial fibrillation Patient rate controlled on Cardizem 120 mg daily, Toprol XL 150 mg daily. He is anticoagulated with Eliquis 5 mg twice a day.  COPD on oxygen therapy at home Patient is a current smoker with COPD. Uses Spiriva and albuterol at home as well as intermittent 3 L of oxygen use  -continue spiriva daily and albuterol nebs PRN  CKD stage 3, acute exacerbation Creatinine 2.75 on admission baseline appears to be 1.4. Creatinine improved to 1.5 when last drawn yesterday in response to IV fluids and oral intake of fluids.   Systolic Chronic Congestive heart failure Most recent echo in March 2017 showed an ejection fraction of 30%, however was in the setting of acute pulmonary edema. Patient does not appear volume overloaded on exam -Hold Lasix -We'll monitor closely  Dispo: Anticipated discharge today   LOS: 1 day   Ledell Noss, MD 02/20/2016, 7:17 AM Pager: [phone]

## 2016-02-20 NOTE — Evaluation (Signed)
Occupational Therapy Evaluation Patient Details Name: [name] MRN: [id number] DOB: 1945/11/23 Today's Date: 02/20/2016    History of Present Illness Patient is a 70 yo male admitted 02/16/16 with abdominal pain, constipation/impacted stool, UTI, AKI.    PMH: Recent admit this month for colitis, ICM, EF 30%, Afib, pacemaker, CKD, COPD on O2 at home, HTN, CHF, chronic pain   Clinical Impression   Limited evaluation today. Pt with lethargy, reporting having had medication to sleep last night. Pt reports his chief concern was being able to get his meals. Reports he used to have mobile meals, but they stopped and now he microwaves convenience foods provided by his daughter. Pt is not open to SNF and plans to go home today. Will follow as long has he is here.    Follow Up Recommendations  Home health OT (pt declines SNF)    Equipment Recommendations  None recommended by OT    Recommendations for Other Services       Precautions / Restrictions Precautions Precautions: Fall Restrictions Weight Bearing Restrictions: No      Mobility Bed Mobility         Supine to sit: Supervision Sit to supine: Supervision   General bed mobility comments: no physical assist, supervised due to lethargy  Transfers Overall transfer level: Needs assistance     Sit to Stand: Min guard         General transfer comment: Patient declined further mobility due to fatigue.    Balance     Sitting balance-Leahy Scale: Good                                      ADL Overall ADL's : Needs assistance/impaired Eating/Feeding: Independent;Bed level   Grooming: Set up;Sitting   Upper Body Bathing: Set up;Sitting   Lower Body Bathing: Min guard;Sit to/from stand   Upper Body Dressing : Set up;Sitting   Lower Body Dressing: Min guard;Sit to/from stand                 General ADL Comments: Pt is mostly concerned about being able to prepare meals at home. Mostly  eats convenience food warmed in the microwave.     Vision     Perception     Praxis      Pertinent Vitals/Pain Pain Assessment: Faces Faces Pain Scale: No hurt     Hand Dominance Right   Extremity/Trunk Assessment Upper Extremity Assessment Upper Extremity Assessment: Overall WFL for tasks assessed   Lower Extremity Assessment Lower Extremity Assessment: Defer to PT evaluation   Cervical / Trunk Assessment Cervical / Trunk Assessment: Normal   Communication Communication Communication: HOH   Cognition Arousal/Alertness: Lethargic;Suspect due to medications Behavior During Therapy: Flat affect Overall Cognitive Status: Within Functional Limits for tasks assessed                     General Comments       Exercises       Shoulder Instructions      Home Living Family/patient expects to be discharged to:: Private residence Living Arrangements: Alone Available Help at Discharge: Family;Friend(s);Available PRN/intermittently Type of Home: Apartment Home Access: Level entry;Elevator     Home Layout: One level     Bathroom Shower/Tub: Occupational psychologist: Standard     Home Equipment: Environmental consultant - 4 wheels;Walker - 2 wheels;Cane - single point;Bedside commode;Wheelchair -  manual;Other (comment);Cane - quad;Shower seat          Prior Functioning/Environment Level of Independence: Independent with assistive device(s)        Comments: Uses quad cane.  Per patient, he drives, but daughter gets groceries.        OT Problem List: Decreased strength;Decreased activity tolerance;Impaired balance (sitting and/or standing);Decreased knowledge of use of DME or AE;Decreased safety awareness;Cardiopulmonary status limiting activity   OT Treatment/Interventions: Self-care/ADL training;DME and/or AE instruction;Therapeutic activities;Patient/family education;Energy conservation    OT Goals(Current goals can be found in the care plan section)  Acute Rehab OT Goals Patient Stated Goal: go home. no SNF OT Goal Formulation: With patient Time For Goal Achievement: 02/27/16 Potential to Achieve Goals: Good  OT Frequency: Min 2X/week   Barriers to D/C: Decreased caregiver support          Co-evaluation              End of Session    Activity Tolerance: Patient limited by fatigue Patient left: in bed   Time: 0944-1000 OT Time Calculation (min): 16 min Charges:    G-Codes: OT G-codes **NOT FOR INPATIENT CLASS** Functional Assessment Tool Used: clinical judgement Functional Limitation: Self care Self Care Current Status [id number]): At least 1 percent but less than 20 percent impaired, limited or restricted Self Care Goal Status [id number]): At least 1 percent but less than 20 percent impaired, limited or restricted  Malka So 02/20/2016, 11:22 AM  [phone]

## 2016-02-20 NOTE — Progress Notes (Signed)
[name] Reason to be D/C'd Home per MD order.  Discussed prescriptions and follow up appointments with the patient. Prescriptions given to patient, medication list explained in detail. Pt verbalized understanding.    Medication List    STOP taking these medications   diltiazem 60 MG 12 hr capsule Commonly known as:  CARDIZEM SR   sulfamethoxazole-trimethoprim 800-160 MG tablet Commonly known as:  BACTRIM DS,SEPTRA DS     TAKE these medications   albuterol 108 (90 Base) MCG/ACT inhaler Commonly known as:  VENTOLIN HFA Inhale 2 puffs into the lungs every 6 (six) hours as needed for wheezing or shortness of breath.   apixaban 5 MG Tabs tablet Commonly known as:  ELIQUIS Take 1 tablet (5 mg total) by mouth 2 (two) times daily.   aspirin 81 MG chewable tablet Chew 1 tablet (81 mg total) by mouth daily.   buPROPion 75 MG tablet Commonly known as:  WELLBUTRIN Take 1 tablet (75 mg total) by mouth 2 (two) times daily.   cholecalciferol 1000 units tablet Commonly known as:  VITAMIN D Take 1 tablet (1,000 Units total) by mouth daily.   diltiazem 120 MG 24 hr capsule Commonly known as:  CARDIZEM CD Take 1 capsule (120 mg total) by mouth daily.   docusate sodium 100 MG capsule Commonly known as:  COLACE Take 1 capsule (100 mg total) by mouth 2 (two) times daily.   dronabinol 2.5 MG capsule Commonly known as:  MARINOL Take 1 capsule (2.5 mg total) by mouth 2 (two) times daily before lunch and supper.   furosemide 40 MG tablet Commonly known as:  LASIX Take 1 tablet (40 mg total) by mouth daily.   meclizine 25 MG tablet Commonly known as:  ANTIVERT Take 25 mg by mouth daily.   metoprolol succinate 100 MG 24 hr tablet Commonly known as:  TOPROL-XL Take 1 tablet (100 mg total) by mouth daily. Take with or immediately following a meal. What changed:  Another medication with the same name was removed. Continue taking this medication, and follow the directions you see here.    metoprolol succinate 50 MG 24 hr tablet Commonly known as:  TOPROL-XL Take 1 tablet (50 mg total) by mouth daily. Take with or immediately following a meal. What changed:  Another medication with the same name was removed. Continue taking this medication, and follow the directions you see here.   mirtazapine 30 MG tablet Commonly known as:  REMERON Take 1 tablet (30 mg total) by mouth at bedtime.   omeprazole 40 MG capsule Commonly known as:  PRILOSEC Take 1 capsule (40 mg total) by mouth daily.   oxyCODONE-acetaminophen 10-325 MG tablet Commonly known as:  PERCOCET Take 1 tablet by mouth every 4 (four) hours as needed for pain. Do not take more than 5 tablets in 24 hours.   OXYGEN Inhale 3 L into the lungs as needed (shortness of breath).   rosuvastatin 20 MG tablet Commonly known as:  CRESTOR Take 1 tablet (20 mg total) by mouth daily at 6 PM. What changed:  when to take this   senna-docusate 8.6-50 MG tablet Commonly known as:  [employer] SENNA PLUS Take 1 tablet by mouth daily as needed for moderate constipation.   tiotropium 18 MCG inhalation capsule Commonly known as:  SPIRIVA Place 1 capsule (18 mcg total) into inhaler and inhale daily.       Vitals:   02/20/16 0500 02/20/16 0915  BP: 113/66 107/66  Pulse: (!) 113 61  Resp:  18 18  Temp: 98.3 F (36.8 C) 97.8 F (36.6 C)    Skin clean, dry and intact without evidence of skin break down, no evidence of skin tears noted. IV catheter discontinued intact. Site without signs and symptoms of complications. Dressing and pressure applied. Pt denies pain at this time. No complaints noted.  An After Visit Summary was printed and given to the patient. Patient escorted via [place], and D/C home via private auto.  Haywood Lasso BSN, RN [facility] Phone [phone]

## 2016-02-20 NOTE — Telephone Encounter (Signed)
Dr patel do you approve of palliative care?

## 2016-02-20 NOTE — Telephone Encounter (Signed)
Calling to verify its okay patient to add ot and home health aide

## 2016-02-20 NOTE — Care Management Note (Signed)
Case Management Note  Patient Details  Name: PATTRICK RAMIRES MRN: [id number] Date of Birth: [birth year]              Action/Plan: Discharge Planning: AVS reviewed: See previous NCM notes, contacted [facility], [name] to make aware of dc home today with resumption of care. Spoke to attending for orders for [facility].  PCP- Zada Finders MD  Expected Discharge Date: 02/20/2016           Expected Discharge Plan:  [place]  In-House Referral:  NA  Discharge planning Services  CM Consult  Post Acute Care Choice:  Resumption of Svcs/PTA Provider, Home Health Choice offered to:  Patient  DME Arranged:  N/A DME Agency:  NA  HH Arranged:  RN, PT, Social Work, Nurse's Aide, OT [place] Agency:  [facility]  Status of Service:  Completed, signed off  If discussed at [employer], dates discussed:    Additional Comments:  Erenest Rasher, RN 02/20/2016, 11:55 AM

## 2016-02-20 NOTE — Telephone Encounter (Signed)
Yes, I think that is appropriate. Thank you [name].

## 2016-02-20 NOTE — Progress Notes (Signed)
PT Cancellation Note  Patient Details Name: [name] MRN: [id number] DOB: Sep 09, 1945   Cancelled Treatment:    Reason Eval/Treat Not Completed: Other (comment)   Pt received a phone call early in our session, and he declined further mobility;  Noted he will likely dc today; Will retry later today as time allows;   Roney Marion, [place]  Acute Rehabilitation Services Pager [phone] Office [phone]    Roney Marion [facility] 02/20/2016, 11:48 AM

## 2016-02-21 ENCOUNTER — Encounter: Payer: Commercial Managed Care - HMO | Admitting: Internal Medicine

## 2016-02-21 ENCOUNTER — Other Ambulatory Visit: Payer: Self-pay | Admitting: *Deleted

## 2016-02-21 DIAGNOSIS — N390 Urinary tract infection, site not specified: Secondary | ICD-10-CM | POA: Diagnosis not present

## 2016-02-21 DIAGNOSIS — I481 Persistent atrial fibrillation: Secondary | ICD-10-CM | POA: Diagnosis not present

## 2016-02-21 DIAGNOSIS — I130 Hypertensive heart and chronic kidney disease with heart failure and stage 1 through stage 4 chronic kidney disease, or unspecified chronic kidney disease: Secondary | ICD-10-CM | POA: Diagnosis not present

## 2016-02-21 DIAGNOSIS — R918 Other nonspecific abnormal finding of lung field: Secondary | ICD-10-CM | POA: Diagnosis not present

## 2016-02-21 DIAGNOSIS — B9629 Other Escherichia coli [E. coli] as the cause of diseases classified elsewhere: Secondary | ICD-10-CM | POA: Diagnosis not present

## 2016-02-21 DIAGNOSIS — K529 Noninfective gastroenteritis and colitis, unspecified: Secondary | ICD-10-CM | POA: Diagnosis not present

## 2016-02-21 DIAGNOSIS — J449 Chronic obstructive pulmonary disease, unspecified: Secondary | ICD-10-CM | POA: Diagnosis not present

## 2016-02-21 DIAGNOSIS — N183 Chronic kidney disease, stage 3 (moderate): Secondary | ICD-10-CM | POA: Diagnosis not present

## 2016-02-21 DIAGNOSIS — I5042 Chronic combined systolic (congestive) and diastolic (congestive) heart failure: Secondary | ICD-10-CM | POA: Diagnosis not present

## 2016-02-21 NOTE — Patient Outreach (Signed)
[facility]) Care Management  02/21/2016  GOBEL ASLIN Apr 09, 1946 [id number]   Reintroduced the [facility] program and inquired on his recent observational stay at the hospital. Pt states he is doing well but currently not at home and he does not have his discharge information and not sure about his medications list (mentioned changes with his Metoprolol and Cardizem) but will be available tomorrow to discuss in detail. RN will reschedule follow up call for tomorrow at pt's request. Will completed the transition of care information at that time but pt able to reported his weights with yesterday and today at 147 lbs and last week at 143 lbs. Pt reports nutritional issues due to poor appetite with limited food stamps but pt consumes Ensure when he can afford it. RN offered to mail coupon for Ensure and encouraged pt to consume 2-3 when appetite is poor (pt agreed).  Pt also states he has a pending appointment with meal on wheel on 10/19. Pt reports he rescheduled his missed appointment today to 10/6. Pt reports he is moving more with his activities. Pt states [facility] involved with [facility] but pt does not remember the name of the agency just the person that visited [place]). . RN will follow up tomorrow for additional information from pt.  [name], RN Care Management Coordinator [place] Office [phone]

## 2016-02-22 ENCOUNTER — Telehealth: Payer: Self-pay | Admitting: *Deleted

## 2016-02-22 ENCOUNTER — Other Ambulatory Visit: Payer: Self-pay | Admitting: *Deleted

## 2016-02-22 ENCOUNTER — Telehealth: Payer: Self-pay | Admitting: Internal Medicine

## 2016-02-22 NOTE — Telephone Encounter (Signed)
[place] [facility] calls- bj and Nunzio Cory and states mr Hawkes is not at home when he has agreed to be there, this has been ongoing since sept and as of today they will discharge him from care. Attempted to call pt, no answer.

## 2016-02-22 NOTE — Telephone Encounter (Signed)
APT. REMINDER CALL, LMTCB °

## 2016-02-23 ENCOUNTER — Ambulatory Visit (INDEPENDENT_AMBULATORY_CARE_PROVIDER_SITE_OTHER): Payer: Commercial Managed Care - HMO | Admitting: Internal Medicine

## 2016-02-23 VITALS — BP 83/57 | HR 68 | Temp 97.5°F | Ht 75.0 in | Wt 146.2 lb

## 2016-02-23 DIAGNOSIS — Z5189 Encounter for other specified aftercare: Secondary | ICD-10-CM

## 2016-02-23 DIAGNOSIS — Z681 Body mass index (BMI) 19 or less, adult: Secondary | ICD-10-CM

## 2016-02-23 DIAGNOSIS — Z23 Encounter for immunization: Secondary | ICD-10-CM | POA: Diagnosis not present

## 2016-02-23 DIAGNOSIS — K5900 Constipation, unspecified: Secondary | ICD-10-CM

## 2016-02-23 DIAGNOSIS — R630 Anorexia: Secondary | ICD-10-CM

## 2016-02-23 DIAGNOSIS — Z0000 Encounter for general adult medical examination without abnormal findings: Secondary | ICD-10-CM

## 2016-02-23 DIAGNOSIS — I481 Persistent atrial fibrillation: Secondary | ICD-10-CM

## 2016-02-23 DIAGNOSIS — Z7189 Other specified counseling: Secondary | ICD-10-CM

## 2016-02-23 DIAGNOSIS — Z7901 Long term (current) use of anticoagulants: Secondary | ICD-10-CM

## 2016-02-23 DIAGNOSIS — Z79899 Other long term (current) drug therapy: Secondary | ICD-10-CM

## 2016-02-23 DIAGNOSIS — Z515 Encounter for palliative care: Secondary | ICD-10-CM

## 2016-02-23 DIAGNOSIS — I4811 Longstanding persistent atrial fibrillation: Secondary | ICD-10-CM

## 2016-02-23 DIAGNOSIS — F17210 Nicotine dependence, cigarettes, uncomplicated: Secondary | ICD-10-CM

## 2016-02-23 MED ORDER — SENNOSIDES-DOCUSATE SODIUM 8.6-50 MG PO TABS: 1.0000 | ORAL_TABLET | Freq: Two times a day (BID) | ORAL | 3 refills | Status: DC

## 2016-02-23 MED ORDER — MIRTAZAPINE 15 MG PO TABS: 15.0000 mg | ORAL_TABLET | Freq: Every day | ORAL | 2 refills | Status: DC

## 2016-02-23 MED ORDER — POLYETHYLENE GLYCOL 3350 17 G PO PACK: 17.0000 g | PACK | Freq: Every day | ORAL | Status: DC | PRN

## 2016-02-23 MED ORDER — BISACODYL 10 MG RE SUPP: 10.0000 mg | Freq: Every day | RECTAL | 0 refills | Status: DC | PRN

## 2016-02-23 NOTE — Progress Notes (Signed)
 CC: Constipation  HPI:  [name] is a 70 y.o. male with PMH of Persistent Afib with tachy-brady syndrome, CHF, COPD, asbestosis, and chronic pain who presents for hospital follow up management of constipation, low appetite, atrial fibrillation, and goals of care.   Appetite: Patient reports appetite is somewhat improved since discharge from hospital. He is taking Marinol 2.5 mg BID with some effect. He was previously taking Mirtazepine 30 mg nightly however self-discontinued this due to increased drowsiness in the morning. He has boost and ensure at home and drinks maybe one a day. He says he tries to eat greens, proteins, and carbs. He reports having a meeting with meals on wheels next week.  Constipation: Patient reports no BM for 3 days. He is taking Senokot twice a day and Colace daily. He is willing to try a suppository if needed. He has not had abdominal bloating or pain.  Afib: Has persistently difficult to control atrial fibrillation. He had some confusion with his medications and has not been taking his home Diltiazem 120 mg daily. He reports adherence to Metoprolol 150 mg daily. He is taking Eliquis 5 mg BID for anticoagulation.  Goals of Care: Patient with 6 hospital admissions in last 6 months including a stint in the ICU requiring intubation for respiratory distress in setting of aggressive fluid administration. Most of his admissions stem from his inability to care for himself at home where he lives by himself. He has had home health nursing and PT however either has refused services or was not at home during agreed upon meeting times.  Healthcare Maintenance: He has agreed to have the flu shot.    Past Medical History:  Diagnosis Date  . Alcohol abuse   . Anxiety   . Avascular necrosis of bones of both hips (HCC)    Status post bilateral hip replacements  . Bipolar disorder (HCC)   . CHF (congestive heart failure) (HCC)   . Chronic systolic heart failure  (HCC)    a. NICM - EF 35% with normal cors 2003. b. Last echo 11/2012 - EF 25-30%.  . CKD (chronic kidney disease)    Renal U/S 12/04/2009 showed no pathological findings. Labs 12/04/2009 include normal ESR, C3, C4; neg ANA; SPEP showed nonspecific increase in the alpha-2 region with no M-spike; UPEP showed no monoclonal free light chains; urine IFE showed polyclonal increase in feree Kappa and/or free Lambda light chains. Baseline Cr reported 1.7-2.5.  . COPD (chronic obstructive pulmonary disease) (HCC)   . Depression   . DVT (deep venous thrombosis) (HCC)    He has hypercoagulability with multiple prior DVTs  . E coli bacteremia   . Erectile dysfunction   . Gastritis   . GERD (gastroesophageal reflux disease)   . HCAP (healthcare-associated pneumonia) 08/24/2015  . Hydrocele, unspecified   . Hyperlipemia   . Hypertension   . 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  Thyroid scan 08/28/2009 was normal with no focal areas of abnormal increased or decreased activity seen; the uptake of I 131 sodium iodide at 24 hours was 5.7%.  TSH and free T4 normalized by 08/16/2009.  . Intestinal obstruction   . Lung nodule    Chest CT scan on 12/14/2008 showed a nodular opacity at the left lung base felt to most likely represent scarring.  Follow-up chest CT scan on 06/02/2010 showed parenchymal scarring in the left apex, left  lower lobe, and lingula; some of this scarring at the   left base had a nodular appearance, unchanged. Chest 09/2012 - stable.  . Noncompliance   . On home oxygen therapy    "3L periodically" (08/24/2015)  . Osteoarthritis   . Pleural plaque    H/o asbestos exposure. Chest CT on 06/02/2010 showed stable extensive calcified pleural plaques involving the left hemithorax, consistent with asbestos related pleural disease.  . Portal vein thrombosis   . Spondylosis   . STEMI (ST elevation myocardial infarction) ([place]) 10/11/2014  .  Tachycardia-bradycardia syndrome [facility])    a. 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 b. St. Jude gen change 2010.  [name] Thrombocytopenia ([place])   . 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  . Weight loss    Normal colonoscopy by Dr. Olevia Perches on 11/06/2010.    Review of Systems:   Review of Systems  Respiratory: Negative for shortness of breath.   Cardiovascular: Negative for chest pain.  Gastrointestinal: Positive for constipation. Negative for abdominal pain, diarrhea, nausea and vomiting.  Neurological: Negative for dizziness.     Physical Exam:  Vitals:   02/23/16 1447  BP: (!) 83/57  Pulse: 68  Temp: 97.5 F (36.4 C)  TempSrc: Oral  SpO2: 99%  Weight: 146 lb 3.2 oz (66.3 kg)  Height: 6' 3" (1.905 m)   Physical Exam  Constitutional: He is oriented to person, place, and time. No distress.  Cardiovascular:  Irregularly irregular rhythm  Pulmonary/Chest: Effort normal. No respiratory distress. He has no wheezes. He has no rales.  Musculoskeletal: He exhibits no edema or tenderness.  Neurological: He is alert and oriented to person, place, and time.  Skin: He is not diaphoretic.    Assessment & Plan:   See Encounters Tab for problem based charting.  Patient discussed with Dr. Angelia Mould

## 2016-02-23 NOTE — Patient Outreach (Signed)
[facility]) Care Management  02/23/2016  AIRES LEM 1946/03/19 [id number]  RN followed up with pt today and pt provided more detail information on all his medications confirming his Dilitzaem and Metoprolol. All medications were reviewed and confirmed. Pt reports he has all her medications and taking according to his provider's recommendations. Pt also reports a visit with [facility] but was informed he does not qualify for the program available due to him smoking Marijuana not for pain or any medical condition just "pleasure". Due to this habit pt again does not meet the criteria for enrollment into the Palliative care program. RN further discussed the dangers involved with the use of this drug however pt does not wish to discontinue the use.   Pt is aware that RN can visit is needed although the initial plan was awaiting [facility] but remains receptive to ongoing transition of care contacts over the next few weeks and maybe interested in a health coach at that time. Pt appreciative for the calls and verified he remains in the GREEN zone with his HF with no signs or symptoms. Pt states the name of the involved agency is [facility]    Plan of care discussed along with the goals. RN will schedule next transition of care call for next week.  Patient was recently discharged from hospital and all medications have been reviewed. Post op observation stay at the hospital.  [name], RN Care Management Coordinator [place] Office [phone]

## 2016-02-23 NOTE — Patient Instructions (Signed)
It was a pleasure to see you again [name].  Please take your medications as prescribed.  For your constipation, I will add miralax and dulcolax suppository as needed.  For your appetite, I will reduce your mirtazapine to 15 mg daily to help decrease your drowsiness.  We will work on referral to the [facility] program.  We are giving you the flu shot today.  Please follow up with me in 4 weeks.

## 2016-02-25 NOTE — Assessment & Plan Note (Addendum)
Patient with 6 hospital admissions in last 6 months including a stint in the ICU requiring intubation for respiratory distress in setting of aggressive fluid administration. Most of his admissions stem from his inability to care for himself at home where he lives by himself. He has had home health nursing and PT however either has refused services or was not at home during agreed upon meeting times.  Patient was seen by Palliative Care while admitted and has a verbal order for palliative care services outpatient. I discussed the option of transitioning care to [facility] which he is interested in and has given verbal permission for referral to [facility]. I have provided him a [facility] information pamphlet. I also discussed the type of medical care he would like to receive should he find himself in the hospital again in a situation where he were unable to speak for himself. I brought up his recent need for mechanical ventilation to address his experience during that time. He has stated to me that he would again prefer aggressive measures including ACLS and Intubation if necessary. He states his daughter, [name], is his first contact in emergency situations. -Continue with palliative services -Refer to [facility] -Full Code status

## 2016-02-25 NOTE — Assessment & Plan Note (Signed)
Has persistently difficult to control atrial fibrillation. He had some confusion with his medications and has not been taking his home Diltiazem 120 mg daily. He reports adherence to Metoprolol 150 mg daily. He is taking Eliquis 5 mg BID for anticoagulation.  Patient with irregularly irregular rhythm on exam with normal rate. I reviewed his medications with him and advised him to resume his diltiazem. -Continue Metoprolol 150 mg daily -continue Diltiazem 120 mg 24h daily -Continue Eliquis 5 mg BID

## 2016-02-25 NOTE — Assessment & Plan Note (Signed)
Patient reports appetite is somewhat improved since discharge from hospital. He is taking Marinol 2.5 mg BID with some effect. He was previously taking Mirtazepine 30 mg nightly however self-discontinued this due to increased drowsiness in the morning. He has boost and ensure at home and drinks maybe one a day. He says he tries to eat greens, proteins, and carbs. He reports having a meeting with meals on wheels next week.  Appetite with some improvement, have encouraged patient to continue to eat consistent meals with boost or ensure between meals. He is open to trying a reduced dose of Mirtazapine to see if this limits his morning somnolence and helps to stimulate his appetite more in addition to preferably titrate down Mirtazapine rather than discontinue abruptly. -Decrease Mirtazapine to 15 mg qhs -Continue Marinol 2.5 mg BID -Continue boost/ensure -Continue with Meals on Wheels plan

## 2016-02-25 NOTE — Assessment & Plan Note (Signed)
Patient reports no BM for 3 days. He is taking Senokot twice a day and Colace daily. He is willing to try a suppository if needed. He has not had abdominal bloating or pain.  On return to patient's room, he actually reports having a bowel movement during this clinic visit. Will continue with current bowel regimen and add as needed miralax and dulcolax suppository. -Continue Senokot & Colace -Add prn miralax and dulcolax suppository

## 2016-02-25 NOTE — Assessment & Plan Note (Signed)
Flu shot given

## 2016-02-26 ENCOUNTER — Other Ambulatory Visit: Payer: Self-pay | Admitting: *Deleted

## 2016-02-26 NOTE — Patient Outreach (Signed)
[facility]) Care Management  02/26/2016  [name] 21-Oct-1945 [id number]   RN completed transition of care contact today and inquired on pt's ongoing management of care. Pt reports he is doing well with no encounters as and has several pending appointments (Mobile meals 10/11 and a pending appointment with [facility]). Pt continues to report weights at 147 lbs over the last week with no swelling or reported symptoms. Pt has not received the supplemental coupons for ENSURE by the [facility] office as requested. Pt remains in the GREEN zone with no signs or symptoms of HF. Pt feels he is recovering well at this time and remains receptive to a follow up call next week for transition of care contacts.  [name], RN Care Management Coordinator [place] Office [phone]

## 2016-02-27 NOTE — Progress Notes (Signed)
Internal Medicine Clinic Attending  I saw and evaluated the patient.  I personally confirmed the key portions of the history and exam documented by Dr. Wynetta Emery and I reviewed pertinent patient test results.  The assessment, diagnosis, and plan were formulated together and I agree with the documentation in the resident's note.  Agree with inpatient assessment and treatment.  [name] would likely do better at an ALF or SNF, however, he is adamant to me today that he will not consider these options.  He has lost weight since the last time I saw him.

## 2016-02-27 NOTE — Progress Notes (Signed)
Internal Medicine Clinic Attending  Case discussed with [name] at the time of the visit.  We reviewed the resident's history and exam and pertinent patient test results.  I agree with the assessment, diagnosis, and plan of care documented in the resident's note. I think he would really benefit from PACE.

## 2016-03-04 ENCOUNTER — Other Ambulatory Visit: Payer: Self-pay | Admitting: Internal Medicine

## 2016-03-04 ENCOUNTER — Other Ambulatory Visit: Payer: Self-pay | Admitting: *Deleted

## 2016-03-04 DIAGNOSIS — G8929 Other chronic pain: Secondary | ICD-10-CM

## 2016-03-04 MED ORDER — OXYCODONE-ACETAMINOPHEN 10-325 MG PO TABS: 1.0000 | ORAL_TABLET | ORAL | 0 refills | Status: DC | PRN

## 2016-03-04 NOTE — Telephone Encounter (Signed)
Pain med refill °

## 2016-03-04 NOTE — Patient Outreach (Signed)
[place] [facility]) Care Management  03/04/2016  [name] 1946-02-12 [id number]   RN spoke with pt today and inquired on his ongoing management of care. Pt reports is appetite remains poor and he has not received the ENSURE coupons RN has sent via [place] (will follow up). RN Inquired on mobile meals as pt reports he is pending a letter indicating if he should received services or not. Reports he continues to weight daily with a weight of 142 lbs on yesterday and today. Last week pt's weight was 145 lbs wit no encountered swelling or trouble breathing. RN will continues to encourage adherence with managing his care. Pt is in the GREEN zone on the HF zone and understandings what to do if acute symptoms should occur. Pt aware RN is available to completed community home visit however only recepetive to ongoing transition of care telephone calls at this time. Will scheduled follow up call next week as the last contact.    [name], RN Care Management Coordinator [place] Office [phone]

## 2016-03-05 NOTE — Telephone Encounter (Signed)
Pt informed

## 2016-03-06 ENCOUNTER — Telehealth: Payer: Self-pay

## 2016-03-06 NOTE — Telephone Encounter (Signed)
Needs to speak with a nurse regarding personal problem.  

## 2016-03-06 NOTE — Telephone Encounter (Signed)
Pt states he has dizziness when rising x 1 week, this makes him nauseous and weak, he is taking meclizine but it is not helping. He continues to have decreased appetite, he is drinking ensure and ginger ale appt thurs 10/19 at 1315

## 2016-03-06 NOTE — Telephone Encounter (Signed)
Thanks [name], agree with appointment.

## 2016-03-07 ENCOUNTER — Encounter: Payer: Self-pay | Admitting: Internal Medicine

## 2016-03-07 ENCOUNTER — Telehealth: Payer: Self-pay

## 2016-03-07 ENCOUNTER — Ambulatory Visit: Payer: Commercial Managed Care - HMO

## 2016-03-07 NOTE — Telephone Encounter (Signed)
Pt states he has not been able to get ready for appt"so swimmy headed" ask if something could be called in,informed him he would need to be seen, avised urg care today or appt tomorrow, he states he will try to get to ED or urg care or will call in am for late pm appt, encouraged to go to urg care

## 2016-03-07 NOTE — Telephone Encounter (Signed)
Needs to speak with a nurse.  

## 2016-03-07 NOTE — Telephone Encounter (Signed)
Agree with plan, thank you [name].

## 2016-03-10 DIAGNOSIS — M6281 Muscle weakness (generalized): Secondary | ICD-10-CM | POA: Diagnosis not present

## 2016-03-10 DIAGNOSIS — J449 Chronic obstructive pulmonary disease, unspecified: Secondary | ICD-10-CM | POA: Diagnosis not present

## 2016-03-10 DIAGNOSIS — J129 Viral pneumonia, unspecified: Secondary | ICD-10-CM | POA: Diagnosis not present

## 2016-03-10 DIAGNOSIS — I4891 Unspecified atrial fibrillation: Secondary | ICD-10-CM | POA: Diagnosis not present

## 2016-03-10 DIAGNOSIS — J441 Chronic obstructive pulmonary disease with (acute) exacerbation: Secondary | ICD-10-CM | POA: Diagnosis not present

## 2016-03-10 DIAGNOSIS — N19 Unspecified kidney failure: Secondary | ICD-10-CM | POA: Diagnosis not present

## 2016-03-10 DIAGNOSIS — I5022 Chronic systolic (congestive) heart failure: Secondary | ICD-10-CM | POA: Diagnosis not present

## 2016-03-10 DIAGNOSIS — R2681 Unsteadiness on feet: Secondary | ICD-10-CM | POA: Diagnosis not present

## 2016-03-11 ENCOUNTER — Other Ambulatory Visit: Payer: Self-pay | Admitting: *Deleted

## 2016-03-11 ENCOUNTER — Encounter (HOSPITAL_COMMUNITY): Payer: Self-pay

## 2016-03-11 NOTE — Patient Outreach (Signed)
[facility]) Care Management  03/11/2016  [name] [birth year] [id number]   RN attempted outreach call today for ongoing transition of care. RN able to leave a HIPAA approved voice message. Will continue attempts to reach pt and rescheduled another follow up call next week.   [name], RN Care Management Coordinator [place] Office [phone]

## 2016-03-15 DIAGNOSIS — J449 Chronic obstructive pulmonary disease, unspecified: Secondary | ICD-10-CM | POA: Diagnosis not present

## 2016-03-15 DIAGNOSIS — M6281 Muscle weakness (generalized): Secondary | ICD-10-CM | POA: Diagnosis not present

## 2016-03-15 DIAGNOSIS — I5022 Chronic systolic (congestive) heart failure: Secondary | ICD-10-CM | POA: Diagnosis not present

## 2016-03-15 DIAGNOSIS — N19 Unspecified kidney failure: Secondary | ICD-10-CM | POA: Diagnosis not present

## 2016-03-15 DIAGNOSIS — I4891 Unspecified atrial fibrillation: Secondary | ICD-10-CM | POA: Diagnosis not present

## 2016-03-15 DIAGNOSIS — J129 Viral pneumonia, unspecified: Secondary | ICD-10-CM | POA: Diagnosis not present

## 2016-03-15 DIAGNOSIS — J441 Chronic obstructive pulmonary disease with (acute) exacerbation: Secondary | ICD-10-CM | POA: Diagnosis not present

## 2016-03-15 DIAGNOSIS — R2681 Unsteadiness on feet: Secondary | ICD-10-CM | POA: Diagnosis not present

## 2016-03-18 ENCOUNTER — Other Ambulatory Visit: Payer: Self-pay | Admitting: *Deleted

## 2016-03-18 NOTE — Patient Outreach (Signed)
[place] [facility]) Care Management  03/18/2016  [name] [birth year] [id number]   RN attempted another outreach call however unsuccessful. RN able to leave a HIPAA approved voice message requesting a call back. Will inquire further at that time on pt's ongoing management of care.   [name], RN Care Management Coordinator [place] Office [phone]

## 2016-03-21 DIAGNOSIS — J129 Viral pneumonia, unspecified: Secondary | ICD-10-CM | POA: Diagnosis not present

## 2016-03-21 DIAGNOSIS — J449 Chronic obstructive pulmonary disease, unspecified: Secondary | ICD-10-CM | POA: Diagnosis not present

## 2016-03-21 DIAGNOSIS — N19 Unspecified kidney failure: Secondary | ICD-10-CM | POA: Diagnosis not present

## 2016-03-21 DIAGNOSIS — I4891 Unspecified atrial fibrillation: Secondary | ICD-10-CM | POA: Diagnosis not present

## 2016-03-22 ENCOUNTER — Other Ambulatory Visit: Payer: Self-pay | Admitting: *Deleted

## 2016-03-22 ENCOUNTER — Telehealth: Payer: Self-pay

## 2016-03-22 MED ORDER — MIRTAZAPINE 15 MG PO TABS: 15.0000 mg | ORAL_TABLET | Freq: Every day | ORAL | 0 refills | Status: DC

## 2016-03-22 MED ORDER — MECLIZINE HCL 25 MG PO TABS: 25.0000 mg | ORAL_TABLET | Freq: Every day | ORAL | 0 refills | Status: DC | PRN

## 2016-03-22 NOTE — Telephone Encounter (Signed)
Called [name], she is stating care is ending. Spoke w/ [name]

## 2016-03-22 NOTE — Patient Outreach (Signed)
[name]) Care Management  03/22/2016  [name] 07-02-1945 [id number]   RN received a call from Dr. Serita Grit office [name]). RN returned the call and spoke with [name]. Discussed pt's issues and THN deposistion with this pt at this time. Explained pt's issues today related to not feeling well with complaints of dizziness and more SOB then his usually, pt states. Also updated that pt has not being weighing daily concerning his HF but denies any coughing or congestion at this time. Office representative indicated she would "give him a call" to inquire further.  RN also informed representative that pt has only agreed at this time for telephonic transition of care calls. RN will follow up with pt on Monday concerning any interventions assigned.  No other inquires or orders received at this time.  [name], RN Care Management Coordinator [place] Office [phone]

## 2016-03-22 NOTE — Patient Outreach (Signed)
[place] [facility]) Care Management  03/22/2016  [name] 11/20/1945 [id number]   Several successful outreach attempts completed but RN able to reach pt today for the last transition of care call. RN inquired on pt's ongoing management of care. Pt reports he was not doing to well indicating some breathing problems along with some dizziness. Pt states this has been going on for "awhile" but has not informed his provider or seeked any medical attention at this time. Pt denies any coughing or congestion and confirms he has received his FLU shot.  RN inquired further on his daiy weights related to his CHF. Pt states he has not been consistent with his daily weights and has none to report at this time (based upon previous report pt's last weight was 144 lbs). Pt able to complete full sentences with no audible wheezing or distress with today's conversation however RN strongly encouraged pt to contact his provider if he felt this needed to be address at this time. Pt indicated he would call this RN case manager back once he speaks with Dr. Serita Grit office. Pt is aware when to seek emergency assistance based upon his previous visits to the ED. Note there has been several level of care conversations in the past with this pt that has resulted on one move from residence to retired facility ([facility]) but pt remains in need of more assistance based upon his ongoing encounters with medical issues. RN will contact Dr. Serita Grit office and reported the above findings. Will alert provider that pt indicated he would contact Dr. Serita Grit office for possible intervention today.   Will follow up with pt once again if pt does not contact his RN concerning possible interventions from Dr. Serita Grit office.   [name], RN Care Management Coordinator [place] Office [phone]

## 2016-03-22 NOTE — Telephone Encounter (Signed)
[name] from [facility] needs to speak with a nurse regarding pt.

## 2016-03-25 ENCOUNTER — Other Ambulatory Visit: Payer: Self-pay | Admitting: *Deleted

## 2016-03-25 NOTE — Patient Outreach (Addendum)
[place] [facility]) Care Management  03/25/2016  [name] [birth year] [id number]  RN attempted outreach call however only able to leave a HIPAA approved voice message requesting a call back. Will inquire further on possible intervention suggested via Dr.Patel's office from Friday. Will continues to follow up accordingly and reschedule another call tomorrow.  Travis Mina, RN Care Management Coordinator [place] Office [phone]

## 2016-03-25 NOTE — Addendum Note (Signed)
Addended by: Hulan Fray on: 03/25/2016 09:13 AM   Modules accepted: Orders

## 2016-03-26 ENCOUNTER — Other Ambulatory Visit: Payer: Self-pay | Admitting: *Deleted

## 2016-03-26 ENCOUNTER — Encounter: Payer: Self-pay | Admitting: *Deleted

## 2016-03-26 NOTE — Patient Outreach (Signed)
[place] [facility]) Care Management  03/26/2016  [name] May 15, 1946 [id number]  RN spoke with pt today who indicates he is doing "alittle better" and will follow up with his provider on Thursday this week. Pt states he still has some dizziness. Pt reports he is not completing his daily weights after several calls pt has indicated no weights but states his last weight was 143 lbs as he continues to loose weight. Pt verified he received the ENSURE coupons mailed by this RN via [facility] office for ongoing supplements to assist with his nutritional issues but pt once again continues to use marijuana for his enjoyment as informed on a previous telephonic conversation with this pt. Although pt has met the goals in place with attending his medical appointments and taking his prescribed medications as recommended with no readmission over the last 30 days pt continues to refuse daily monitoring of his HF with no reported weights. Due to pt's decline for monitoring his HF will discharge at this time notify primary provider of pt's discharge via [facility] program. No additional 60 days extension of telephonic calls for disease management will continue due to pt's non-adherence with daily weights and monitoring his HF issues. Case will be closed.  [name], RN Care Management Coordinator [place] Office [phone]

## 2016-03-27 ENCOUNTER — Other Ambulatory Visit: Payer: Self-pay | Admitting: Internal Medicine

## 2016-03-28 ENCOUNTER — Encounter: Payer: Commercial Managed Care - HMO | Admitting: Internal Medicine

## 2016-03-28 ENCOUNTER — Telehealth: Payer: Self-pay | Admitting: Internal Medicine

## 2016-03-28 NOTE — Telephone Encounter (Signed)
APT. REMINDER CALL, LMTCB °

## 2016-04-04 ENCOUNTER — Encounter: Payer: Commercial Managed Care - HMO | Admitting: Internal Medicine

## 2016-04-04 ENCOUNTER — Encounter: Payer: Self-pay | Admitting: Internal Medicine

## 2016-04-10 DIAGNOSIS — N19 Unspecified kidney failure: Secondary | ICD-10-CM | POA: Diagnosis not present

## 2016-04-10 DIAGNOSIS — I5022 Chronic systolic (congestive) heart failure: Secondary | ICD-10-CM | POA: Diagnosis not present

## 2016-04-10 DIAGNOSIS — M6281 Muscle weakness (generalized): Secondary | ICD-10-CM | POA: Diagnosis not present

## 2016-04-10 DIAGNOSIS — J449 Chronic obstructive pulmonary disease, unspecified: Secondary | ICD-10-CM | POA: Diagnosis not present

## 2016-04-10 DIAGNOSIS — R2681 Unsteadiness on feet: Secondary | ICD-10-CM | POA: Diagnosis not present

## 2016-04-10 DIAGNOSIS — J129 Viral pneumonia, unspecified: Secondary | ICD-10-CM | POA: Diagnosis not present

## 2016-04-10 DIAGNOSIS — J441 Chronic obstructive pulmonary disease with (acute) exacerbation: Secondary | ICD-10-CM | POA: Diagnosis not present

## 2016-04-10 DIAGNOSIS — I4891 Unspecified atrial fibrillation: Secondary | ICD-10-CM | POA: Diagnosis not present

## 2016-04-15 DIAGNOSIS — R2681 Unsteadiness on feet: Secondary | ICD-10-CM | POA: Diagnosis not present

## 2016-04-15 DIAGNOSIS — I4891 Unspecified atrial fibrillation: Secondary | ICD-10-CM | POA: Diagnosis not present

## 2016-04-15 DIAGNOSIS — N19 Unspecified kidney failure: Secondary | ICD-10-CM | POA: Diagnosis not present

## 2016-04-15 DIAGNOSIS — I5022 Chronic systolic (congestive) heart failure: Secondary | ICD-10-CM | POA: Diagnosis not present

## 2016-04-15 DIAGNOSIS — J129 Viral pneumonia, unspecified: Secondary | ICD-10-CM | POA: Diagnosis not present

## 2016-04-15 DIAGNOSIS — M6281 Muscle weakness (generalized): Secondary | ICD-10-CM | POA: Diagnosis not present

## 2016-04-15 DIAGNOSIS — J441 Chronic obstructive pulmonary disease with (acute) exacerbation: Secondary | ICD-10-CM | POA: Diagnosis not present

## 2016-04-15 DIAGNOSIS — J449 Chronic obstructive pulmonary disease, unspecified: Secondary | ICD-10-CM | POA: Diagnosis not present

## 2016-04-16 ENCOUNTER — Other Ambulatory Visit: Payer: Self-pay | Admitting: *Deleted

## 2016-04-16 NOTE — Telephone Encounter (Signed)
Received refill request from [facility]-will send to pcp for review.  Of note, please confirm metoprolol dosage 50mg  vs 100mg .

## 2016-04-17 ENCOUNTER — Telehealth: Payer: Self-pay

## 2016-04-17 MED ORDER — METOPROLOL SUCCINATE ER 50 MG PO TB24: 50.0000 mg | ORAL_TABLET | Freq: Every day | ORAL | 3 refills | Status: DC

## 2016-04-17 MED ORDER — METOPROLOL SUCCINATE ER 100 MG PO TB24: 100.0000 mg | ORAL_TABLET | Freq: Every day | ORAL | 3 refills | Status: DC

## 2016-04-17 MED ORDER — MECLIZINE HCL 25 MG PO TABS: 25.0000 mg | ORAL_TABLET | Freq: Every day | ORAL | 0 refills | Status: DC | PRN

## 2016-04-17 MED ORDER — DILTIAZEM HCL ER COATED BEADS 120 MG PO CP24: 120.0000 mg | ORAL_CAPSULE | Freq: Every day | ORAL | 3 refills | Status: DC

## 2016-04-17 MED ORDER — MIRTAZAPINE 15 MG PO TABS: 15.0000 mg | ORAL_TABLET | Freq: Every day | ORAL | 0 refills | Status: DC

## 2016-04-17 NOTE — Telephone Encounter (Signed)
Caryl Pina from [facility] requesting 90 days on  diltiazem (CARDIZEM CD) 120 MG 24 hr capsule,  metoprolol succinate (TOPROL-XL) 100.

## 2016-04-17 NOTE — Telephone Encounter (Signed)
He is prescribed Metoprolol succinate 150 mg daily. He takes one 100 mg tab and one 50 mg tab daily.

## 2016-04-17 NOTE — Telephone Encounter (Signed)
Pharmacy calls and request 90 days supply of both meds also is pt to take both metoprolols? If you agree w/ 90 day scripts please send new ones to pharm. Thank you

## 2016-04-18 MED ORDER — METOPROLOL SUCCINATE ER 100 MG PO TB24: 100.0000 mg | ORAL_TABLET | Freq: Every day | ORAL | 3 refills | Status: DC

## 2016-04-18 MED ORDER — DILTIAZEM HCL ER COATED BEADS 120 MG PO CP24: 120.0000 mg | ORAL_CAPSULE | Freq: Every day | ORAL | 3 refills | Status: DC

## 2016-04-18 MED ORDER — METOPROLOL SUCCINATE ER 50 MG PO TB24: 50.0000 mg | ORAL_TABLET | Freq: Every day | ORAL | 3 refills | Status: DC

## 2016-04-18 NOTE — Telephone Encounter (Signed)
Agree with 90 day prescriptions. Patient takes Metoprolol succinate 150 mg daily. As there is no 150 mg formulation, he takes both 100 mg and 50 mg tablets once daily to meet his 150 mg daily dose. Will refill 90 days supply for Diltiazem and Metoprolol as above.

## 2016-04-19 DIAGNOSIS — E46 Unspecified protein-calorie malnutrition: Secondary | ICD-10-CM

## 2016-04-19 HISTORY — DX: Unspecified protein-calorie malnutrition: E46

## 2016-04-20 DIAGNOSIS — N19 Unspecified kidney failure: Secondary | ICD-10-CM | POA: Diagnosis not present

## 2016-04-20 DIAGNOSIS — J129 Viral pneumonia, unspecified: Secondary | ICD-10-CM | POA: Diagnosis not present

## 2016-04-20 DIAGNOSIS — J449 Chronic obstructive pulmonary disease, unspecified: Secondary | ICD-10-CM | POA: Diagnosis not present

## 2016-04-20 DIAGNOSIS — I4891 Unspecified atrial fibrillation: Secondary | ICD-10-CM | POA: Diagnosis not present

## 2016-04-29 ENCOUNTER — Encounter: Payer: Self-pay | Admitting: Internal Medicine

## 2016-04-29 ENCOUNTER — Encounter (HOSPITAL_COMMUNITY): Payer: Self-pay

## 2016-04-29 ENCOUNTER — Inpatient Hospital Stay (HOSPITAL_COMMUNITY)
Admission: AD | Admit: 2016-04-29 | Discharge: 2016-05-02 | DRG: 682 | Disposition: A | Discharge status: Home / Self Care | Payer: Commercial Managed Care - HMO | Source: Ambulatory Visit | Attending: Internal Medicine | Admitting: Internal Medicine

## 2016-04-29 ENCOUNTER — Ambulatory Visit (INDEPENDENT_AMBULATORY_CARE_PROVIDER_SITE_OTHER): Payer: Commercial Managed Care - HMO | Admitting: Internal Medicine

## 2016-04-29 VITALS — BP 94/74 | HR 120 | Wt 130.4 lb

## 2016-04-29 DIAGNOSIS — F1120 Opioid dependence, uncomplicated: Secondary | ICD-10-CM | POA: Insufficient documentation

## 2016-04-29 DIAGNOSIS — F17210 Nicotine dependence, cigarettes, uncomplicated: Secondary | ICD-10-CM | POA: Diagnosis present

## 2016-04-29 DIAGNOSIS — Z9981 Dependence on supplemental oxygen: Secondary | ICD-10-CM

## 2016-04-29 DIAGNOSIS — E43 Unspecified severe protein-calorie malnutrition: Secondary | ICD-10-CM | POA: Diagnosis present

## 2016-04-29 DIAGNOSIS — N183 Chronic kidney disease, stage 3 (moderate): Secondary | ICD-10-CM | POA: Diagnosis not present

## 2016-04-29 DIAGNOSIS — I5042 Chronic combined systolic (congestive) and diastolic (congestive) heart failure: Secondary | ICD-10-CM | POA: Diagnosis not present

## 2016-04-29 DIAGNOSIS — Z7982 Long term (current) use of aspirin: Secondary | ICD-10-CM

## 2016-04-29 DIAGNOSIS — I951 Orthostatic hypotension: Secondary | ICD-10-CM

## 2016-04-29 DIAGNOSIS — E860 Dehydration: Secondary | ICD-10-CM

## 2016-04-29 DIAGNOSIS — I4811 Longstanding persistent atrial fibrillation: Secondary | ICD-10-CM | POA: Diagnosis present

## 2016-04-29 DIAGNOSIS — J449 Chronic obstructive pulmonary disease, unspecified: Secondary | ICD-10-CM | POA: Diagnosis present

## 2016-04-29 DIAGNOSIS — I4891 Unspecified atrial fibrillation: Secondary | ICD-10-CM

## 2016-04-29 DIAGNOSIS — F319 Bipolar disorder, unspecified: Secondary | ICD-10-CM | POA: Diagnosis present

## 2016-04-29 DIAGNOSIS — R627 Adult failure to thrive: Secondary | ICD-10-CM | POA: Diagnosis not present

## 2016-04-29 DIAGNOSIS — Z96643 Presence of artificial hip joint, bilateral: Secondary | ICD-10-CM | POA: Diagnosis present

## 2016-04-29 DIAGNOSIS — E875 Hyperkalemia: Secondary | ICD-10-CM | POA: Diagnosis present

## 2016-04-29 DIAGNOSIS — I129 Hypertensive chronic kidney disease with stage 1 through stage 4 chronic kidney disease, or unspecified chronic kidney disease: Secondary | ICD-10-CM | POA: Diagnosis not present

## 2016-04-29 DIAGNOSIS — I959 Hypotension, unspecified: Secondary | ICD-10-CM

## 2016-04-29 DIAGNOSIS — Z880 Allergy status to penicillin: Secondary | ICD-10-CM | POA: Diagnosis not present

## 2016-04-29 DIAGNOSIS — I481 Persistent atrial fibrillation: Secondary | ICD-10-CM | POA: Diagnosis not present

## 2016-04-29 DIAGNOSIS — Z950 Presence of cardiac pacemaker: Secondary | ICD-10-CM

## 2016-04-29 DIAGNOSIS — E0590 Thyrotoxicosis, unspecified without thyrotoxic crisis or storm: Secondary | ICD-10-CM | POA: Diagnosis present

## 2016-04-29 DIAGNOSIS — N179 Acute kidney failure, unspecified: Principal | ICD-10-CM | POA: Diagnosis present

## 2016-04-29 DIAGNOSIS — Z7901 Long term (current) use of anticoagulants: Secondary | ICD-10-CM

## 2016-04-29 DIAGNOSIS — Z888 Allergy status to other drugs, medicaments and biological substances status: Secondary | ICD-10-CM

## 2016-04-29 DIAGNOSIS — I495 Sick sinus syndrome: Secondary | ICD-10-CM | POA: Diagnosis not present

## 2016-04-29 DIAGNOSIS — Z79899 Other long term (current) drug therapy: Secondary | ICD-10-CM

## 2016-04-29 DIAGNOSIS — G8929 Other chronic pain: Secondary | ICD-10-CM

## 2016-04-29 DIAGNOSIS — Z8249 Family history of ischemic heart disease and other diseases of the circulatory system: Secondary | ICD-10-CM | POA: Diagnosis not present

## 2016-04-29 DIAGNOSIS — N189 Chronic kidney disease, unspecified: Secondary | ICD-10-CM | POA: Diagnosis not present

## 2016-04-29 DIAGNOSIS — Z86718 Personal history of other venous thrombosis and embolism: Secondary | ICD-10-CM

## 2016-04-29 DIAGNOSIS — F1290 Cannabis use, unspecified, uncomplicated: Secondary | ICD-10-CM

## 2016-04-29 DIAGNOSIS — I130 Hypertensive heart and chronic kidney disease with heart failure and stage 1 through stage 4 chronic kidney disease, or unspecified chronic kidney disease: Secondary | ICD-10-CM | POA: Diagnosis present

## 2016-04-29 DIAGNOSIS — Z79891 Long term (current) use of opiate analgesic: Secondary | ICD-10-CM

## 2016-04-29 HISTORY — DX: Unspecified protein-calorie malnutrition: E46

## 2016-04-29 LAB — BASIC METABOLIC PANEL
Anion gap: 6 (ref 5–15)
BUN: 38 mg/dL — AB (ref 6–20)
CHLORIDE: 111 mmol/L (ref 101–111)
CO2: 26 mmol/L (ref 22–32)
CREATININE: 2.24 mg/dL — AB (ref 0.61–1.24)
Calcium: 10 mg/dL (ref 8.9–10.3)
GFR calc Af Amer: 32 mL/min — ABNORMAL LOW (ref >60)
GFR calc non Af Amer: 28 mL/min — ABNORMAL LOW (ref >60)
GLUCOSE: 63 mg/dL — AB (ref 65–99)
Potassium: 5.4 mmol/L — ABNORMAL HIGH (ref 3.5–5.1)
Sodium: 143 mmol/L (ref 135–145)

## 2016-04-29 LAB — CBC
HCT: 46.6 % (ref 39.0–52.0)
Hemoglobin: 15.3 g/dL (ref 13.0–17.0)
MCH: 30.4 pg (ref 26.0–34.0)
MCHC: 32.8 g/dL (ref 30.0–36.0)
MCV: 92.6 fL (ref 78.0–100.0)
PLATELETS: 128 10*3/uL — AB (ref 150–400)
RBC: 5.03 MIL/uL (ref 4.22–5.81)
RDW: 15.9 % — AB (ref 11.5–15.5)
WBC: 7 10*3/uL (ref 4.0–10.5)

## 2016-04-29 MED ORDER — VITAMIN D 1000 UNITS PO TABS: 1000.0000 [IU] | ORAL_TABLET | Freq: Every day | ORAL | Status: DC

## 2016-04-29 MED ORDER — OXYCODONE HCL 5 MG PO TABS
5.0000 mg | ORAL_TABLET | Freq: Four times a day (QID) | ORAL | Status: DC | PRN
  Administered 2016-04-29: 5 mg via ORAL
  Filled 2016-04-29: qty 1

## 2016-04-29 MED ORDER — METOPROLOL SUCCINATE ER 50 MG PO TB24: 50.0000 mg | ORAL_TABLET | Freq: Every day | ORAL | Status: DC

## 2016-04-29 MED ORDER — BUPROPION HCL 75 MG PO TABS
75.0000 mg | ORAL_TABLET | Freq: Two times a day (BID) | ORAL | Status: DC
  Administered 2016-04-29 – 2016-05-01 (×5): 75 mg via ORAL
  Filled 2016-04-29 (×7): qty 1

## 2016-04-29 MED ORDER — APIXABAN 5 MG PO TABS
5.0000 mg | ORAL_TABLET | Freq: Two times a day (BID) | ORAL | Status: DC
  Administered 2016-04-29 – 2016-04-30 (×2): 5 mg via ORAL
  Filled 2016-04-29 (×2): qty 1

## 2016-04-29 MED ORDER — TIOTROPIUM BROMIDE MONOHYDRATE 18 MCG IN CAPS
18.0000 ug | ORAL_CAPSULE | Freq: Every day | RESPIRATORY_TRACT | Status: DC
  Administered 2016-04-30 – 2016-05-02 (×3): 18 ug via RESPIRATORY_TRACT
  Filled 2016-04-29: qty 5

## 2016-04-29 MED ORDER — ENSURE ENLIVE PO LIQD: 237.0000 mL | Freq: Two times a day (BID) | ORAL | Status: DC

## 2016-04-29 MED ORDER — SODIUM CHLORIDE 0.9 % IV SOLN
INTRAVENOUS | Status: AC
  Administered 2016-04-30: 04:00:00 via INTRAVENOUS

## 2016-04-29 MED ORDER — OXYCODONE-ACETAMINOPHEN 10-325 MG PO TABS: 1.0000 | ORAL_TABLET | Freq: Four times a day (QID) | ORAL | Status: DC | PRN

## 2016-04-29 MED ORDER — BISACODYL 10 MG RE SUPP: 10.0000 mg | Freq: Every day | RECTAL | Status: DC | PRN

## 2016-04-29 MED ORDER — ASPIRIN 81 MG PO CHEW
81.0000 mg | CHEWABLE_TABLET | Freq: Every day | ORAL | Status: DC
  Administered 2016-04-30 – 2016-05-01 (×2): 81 mg via ORAL
  Filled 2016-04-29 (×2): qty 1

## 2016-04-29 MED ORDER — ALBUTEROL SULFATE (2.5 MG/3ML) 0.083% IN NEBU
2.5000 mg | INHALATION_SOLUTION | Freq: Four times a day (QID) | RESPIRATORY_TRACT | Status: DC | PRN
Start: 2016-04-29 — End: 2016-05-02

## 2016-04-29 MED ORDER — METOPROLOL SUCCINATE ER 100 MG PO TB24
150.0000 mg | ORAL_TABLET | Freq: Every day | ORAL | Status: DC
  Administered 2016-04-30 – 2016-05-01 (×2): 150 mg via ORAL
  Filled 2016-04-29 (×2): qty 2

## 2016-04-29 MED ORDER — DRONABINOL 2.5 MG PO CAPS
2.5000 mg | ORAL_CAPSULE | Freq: Two times a day (BID) | ORAL | Status: DC
  Administered 2016-04-30 – 2016-05-01 (×4): 2.5 mg via ORAL
  Filled 2016-04-29 (×4): qty 1

## 2016-04-29 MED ORDER — OXYCODONE-ACETAMINOPHEN 5-325 MG PO TABS
1.0000 | ORAL_TABLET | Freq: Four times a day (QID) | ORAL | Status: DC | PRN
  Administered 2016-04-29 – 2016-05-01 (×5): 1 via ORAL
  Filled 2016-04-29 (×5): qty 1

## 2016-04-29 MED ORDER — SENNOSIDES-DOCUSATE SODIUM 8.6-50 MG PO TABS
1.0000 | ORAL_TABLET | Freq: Two times a day (BID) | ORAL | Status: DC
  Administered 2016-04-29 – 2016-05-01 (×5): 1 via ORAL
  Filled 2016-04-29 (×5): qty 1

## 2016-04-29 MED ORDER — PANTOPRAZOLE SODIUM 40 MG PO TBEC
40.0000 mg | DELAYED_RELEASE_TABLET | Freq: Every day | ORAL | Status: DC
  Administered 2016-04-30 – 2016-05-01 (×2): 40 mg via ORAL
  Filled 2016-04-29 (×2): qty 1

## 2016-04-29 MED ORDER — SODIUM CHLORIDE 0.9 % IV BOLUS (SEPSIS)
1000.0000 mL | Freq: Once | INTRAVENOUS | Status: DC
Start: 2016-04-29 — End: 2016-05-02

## 2016-04-29 MED ORDER — ROSUVASTATIN CALCIUM 20 MG PO TABS
20.0000 mg | ORAL_TABLET | Freq: Every day | ORAL | Status: DC
  Administered 2016-04-29 – 2016-05-01 (×3): 20 mg via ORAL
  Filled 2016-04-29 (×3): qty 1

## 2016-04-29 MED ORDER — MIRTAZAPINE 15 MG PO TABS
15.0000 mg | ORAL_TABLET | Freq: Every day | ORAL | Status: DC
  Administered 2016-04-29 – 2016-05-01 (×3): 15 mg via ORAL
  Filled 2016-04-29 (×3): qty 1

## 2016-04-29 NOTE — H&P (Signed)
Date: 04/29/2016               Patient Name:  [name] MRN: [id number]  DOB: [birth year] Age / Sex: 70 y.o., male   PCP: Zada Finders, MD         Medical Service: Internal Medicine Teaching Service         Attending Physician: Dr. Lucious Groves, DO    First Contact: Dr. Ophelia Shoulder Pager: [phone]  Second Contact: Dr. Berline Lopes Pager: [phone]       After Hours (After 5p/  First Contact Pager: [phone]  weekends / holidays): Second Contact Pager: [phone]   Chief Complaint: Fatigue and failure to thrive  History of Present Illness: [name] is a 70 year old gentleman with a past medical history of persistent A. 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  The patient states that he has been declining over the previous 9 months. He has had multiple hospital admissions for similar complaints over this time. However, the patient says that over the last month he feels like his energy and appetite have decreased further. He endorses weight loss which from chart review appears to be approximately 16 pounds over the previous 2 months. He states that he has no energy and no appetite. He does complain of some dizzy spells that have been going on for several months. He states he does have good by mouth intake with liquids but is unable to take much food. On review of systems he does endorse one episode of chest pain that happened Saturday night and lasted approximately 3 minutes. He also endorses a 1 week history of constipation and has not had a bowel movement in the last 7 days. He denies nausea, vomiting or diarrhea. He denies dysuria, fevers or night sweats. He denies black or tarry stools.   In  the clinic labs were significant for hyperkalemia with potassium of 5.4 and an acute kidney injury with a creatinine of 2.24. CBC shows mild thrombocytopenia with a platelet count of 128. Labs were otherwise unremarkable.  Meds:  Current Facility-Administered Medications for the 04/29/16 encounter [facility] Encounter)  Medication  . sodium chloride 0.9 % bolus 1,000 mL   No outpatient prescriptions have been marked as taking for the 04/29/16 encounter [facility] Encounter).     Allergies: Allergies as of 04/29/2016 - Review Complete 04/29/2016  Allergen Reaction Noted  . Amiodarone Other (See Comments) 07/30/2015  . Penicillins Rash    Past Medical History:  Diagnosis Date  . Alcohol abuse   . Anxiety   . Avascular necrosis of bones of both hips (HCC)    Status post bilateral hip replacements  . Bipolar disorder ([place])   . CHF (congestive heart failure) ([place])   . Chronic systolic heart failure ([place])    a. NICM - EF 35% with normal cors 2003. b. Last echo 11/2012 - EF 25-30%.  . CKD (chronic kidney disease)    Renal U/S 12/04/2009 showed no pathological findings. Labs 12/04/2009 include normal ESR, C3, C4; neg ANA; SPEP showed nonspecific increase in the alpha-2 region with no M-spike; UPEP showed no monoclonal free light chains; urine IFE showed polyclonal increase in feree Kappa and/or free Lambda light chains. Baseline Cr reported 1.7-2.5.  [name] COPD (  chronic obstructive pulmonary disease) ([place])   . Depression   . DVT (deep venous thrombosis) ([place])    He has hypercoagulability with multiple prior DVTs  . E coli bacteremia   . Erectile dysfunction   . Gastritis   . GERD (gastroesophageal reflux disease)   . HCAP (healthcare-associated pneumonia) 08/24/2015  . Hydrocele, unspecified   . Hyperlipemia   . Hypertension   . 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  Thyroid scan 08/28/2009 was normal with no focal areas of abnormal increased or  decreased activity seen; the uptake of I 131 sodium iodide at 24 hours was 5.7%.  TSH and free T4 normalized by 08/16/2009.  . Intestinal obstruction   . Lung nodule    Chest CT scan on 12/14/2008 showed a nodular opacity at the left lung base felt to most likely represent scarring.  Follow-up chest CT scan on 06/02/2010 showed parenchymal scarring in the left apex, left  lower lobe, and lingula; some of this scarring at the left base had a nodular appearance, unchanged. Chest 09/2012 - stable.  . Noncompliance   . On home oxygen therapy    "3L periodically" (08/24/2015)  . Osteoarthritis   . Pleural plaque    H/o asbestos exposure. Chest CT on 06/02/2010 showed stable extensive calcified pleural plaques involving the left hemithorax, consistent with asbestos related pleural disease.  . Portal vein thrombosis   . Spondylosis   . STEMI (ST elevation myocardial infarction) ([place]) 10/11/2014  . 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 b. St. Jude gen change 2010.  [name] Thrombocytopenia ([place])   . 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  . Weight loss    Normal colonoscopy by Dr. Olevia Perches on 11/06/2010.    Family History:  Family History  Problem Relation Age of Onset  . Hypertension Mother   . Asthma Mother   . Coronary artery disease Mother   . Cancer Mother   . Cancer Brother     Unsure of type.  . Stroke      mother  . Heart attack      brother and sister ?age   . Heart disease Brother   . Colon cancer Neg Hx   . Lung cancer Neg Hx   . Prostate cancer Neg Hx      Social History: Smokes 4-5 cigarettes per day. Denies alcohol use. Does admit to intermittently smoking marijuana  Review of Systems: A complete ROS was negative except as per HPI.   Physical Exam: Blood pressure (!) 87/67, pulse (!) 119, resp. rate 18, height '6\' 3"'  (1.905 m),  weight 125 lb 12.8 oz (57.1 kg), SpO2 100 %. Physical Exam  Constitutional: He is oriented to person, place, and time.  Thin/ cachectic appearing  HENT:  Head: Normocephalic and atraumatic.  Cardiovascular:  Tachycardic on auscultation with a 3/6 holosystolic murmur appreciated best at the left upper sternal border  Respiratory: Effort normal and breath sounds normal. No respiratory distress. He has no wheezes.  GI: Soft. Bowel sounds are normal. He exhibits no distension. There is no tenderness.  Musculoskeletal: He exhibits no edema.  Neurological: He is alert and oriented to person, place, and time.     EKG: No EKG on admission  CXR: No chest x-ray on admission  Assessment & Plan by Problem: Active Problems:   * No active hospital  problems. *   [name] is a 70 year old male with multiple comorbidities who presents with continued fatigue, weight loss and failure to thrive.  1. Failure to thrive Patient presents with an approximately 15 pound weight loss, fatigue and failure to thrive. He has multiple comorbidities that could be contributing to his long-standing failure to thrive with more recent decompensation. -- Regular diet -- Dronabinol --Normal saline at 75 mL an hour -- Ensure feeding supplement  2. Acute on chronic kidney injury Creatinine elevated 2.24 on admission. Baseline the previous several months has been approximately 1.5-1.7. The most likely etiology of the patient's acute kidney injury is prerenal renal the setting of decreased by mouth intake. I suspect his creatinine will improve with IV hydration -- Normal saline 75 mL per hour  3. Atrial fibrillation with tachycardia-bradycardia syndrome -- Apixaban 5 mg twice a day -- Metoprolol XL 150 mg once daily -- Aspirin 81 mg once daily  4. COPD -- Albuterol nebulizer every 6 hours when necessary -- Tiotropium  5. Depression -- Bupropion 75 mg once daily  6. Hyperkalemia Expect this will improve with  fluids -- BMP tomorrow    DVT/PE prophylaxis: Anticoagulated with apixaban secondary to atrial fibrillation FEN/GI: Normal diet CODE STATUS: Full code   Dispo: Admit patient to Observation with expected length of stay less than 2 midnights.  Signed: Ophelia Shoulder, MD 04/29/2016, 6:30 PM  Pager: [phone]

## 2016-04-29 NOTE — Assessment & Plan Note (Addendum)
Patient with poor appetite with weight loss of 16 lbs in 2 months. He has had lethargy and lightheadedness over the last few weeks. He spends most of the day laying in bed and notices feeling dizzy when he gets up to go use the bathroom. He is eating 1 meal a day consisting of soup and bread. He is on the waiting list for meals on wheels. He is taking Marinol 2.5 mg BID which helps "a little" with his appetite. He also uses occasional recreational marijuana. He has taken Mirtazapine at bedtime for appetite, depression, and sleep, however discontinued this due to feeling poorly in the mornings. He has previously tried Megace for appetite which was not helpful. He does have a history of VTE. He was referred to Palliative medicine who have stated he does not meet criteria for their services due to his ongoing use of recreational marijuana. He has been referred to PACE as well, but tells me he was not home during their initial assessment visit and has not rescheduled.  Patient is hypotensive at 94/74 and tachycardic with rate 117-120s. O2 sat is 98% on room air (does use prn O2 at home). He appears volume depleted and lethargic on exam secondary to poor oral intake. He is on a number of medications which can predispose to hypotension including Percocet for his chronic pain and Metoprolol/Diltiazem for his CHF and difficult to control Afib. He is on Eliquis and Aspirin, but reports no obvious sign of bleeding. PET scan on 11/15/15 showed changes consistent with asbestos exposure, but no evidence of malignancy.  Orthostatic vitals are negative: Lying 80/58 pulse 123, sitting 84/61 pulse 124, standing 86/65 pulse 126.  I will check stat labs and give patient a 1 L bolus of NS. I suspect he may need admission for further fluid resuscitation with close monitoring given his CHF (EF 30%) and history of intubation secondary to iatrogenic volume overload earlier this year. He may also need adjustment of his Cardiac  medications and consideration of appetite stimulants. He says he has not taken his Diltiazem for the last few weeks. Long term, we will need to follow up on PACE referral and care at home for his failure to thrive.  BMET does show Acute on Chronic Kidney Injury with elevated creatinine to 2.24 (baseline variable but seems to be between 1.4-1.6) and mild hyperkalemia. Will plan to admit to telemetry. I discussed the case with the admitting team for the Internal Medicine Teaching Service, they will see him on the floor.

## 2016-04-29 NOTE — Progress Notes (Signed)
CC: Decreased appetite, lethargy, lightheadedness  HPI:  [name] is a 70 y.o. 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  Dehydration/Poor Appetite with Malnutrition/Failure to Thrive: Patient with poor appetite with weight loss of 16 lbs in 2 months. He has had lethargy and lightheadedness over the last few weeks. He spends most of the day laying in bed and notices feeling dizzy when he gets up to go use the bathroom. He is eating 1 meal a day consisting of soup and bread. He is on the waiting list for meals on wheels. He is taking Marinol 2.5 mg BID which helps "a little" with his appetite. He also uses occasional recreational marijuana. He has taken Mirtazapine at bedtime for appetite, depression, and sleep, however discontinued this due to feeling poorly in the mornings. He has previously tried Megace for appetite which was not helpful. He does have a history of VTE. He was referred to Palliative medicine who have stated he does not meet criteria for their services due to his ongoing use of recreational marijuana. He has been referred to [facility] as well, but tells me he was not home during their initial assessment visit and has not rescheduled.    Past Medical History:  Diagnosis Date  . Alcohol abuse   . Anxiety   . Avascular necrosis of bones of both hips (HCC)    Status post bilateral hip replacements  . Bipolar disorder ([place])   . CHF (congestive heart failure) ([place])   . Chronic systolic heart failure ([place])    a. NICM - EF 35% with normal cors 2003. b. Last echo 11/2012 - EF 25-30%.  . CKD (chronic kidney disease)    Renal U/S 12/04/2009 showed no pathological findings. Labs 12/04/2009 include normal  ESR, C3, C4; neg ANA; SPEP showed nonspecific increase in the alpha-2 region with no M-spike; UPEP showed no monoclonal free light chains; urine IFE showed polyclonal increase in feree Kappa and/or free Lambda light chains. Baseline Cr reported 1.7-2.5.  [name] COPD (chronic obstructive pulmonary disease) ([place])   . Depression   . DVT (deep venous thrombosis) ([place])    He has hypercoagulability with multiple prior DVTs  . E coli bacteremia   . Erectile dysfunction   . Gastritis   . GERD (gastroesophageal reflux disease)   . HCAP (healthcare-associated pneumonia) 08/24/2015  . Hydrocele, unspecified   . Hyperlipemia   . Hypertension   . 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  Thyroid scan 08/28/2009 was normal with no focal areas of abnormal increased or decreased activity seen; the uptake of I 131 sodium iodide at 24 hours was 5.7%.  TSH and free T4 normalized by 08/16/2009.  . Intestinal obstruction   . Lung nodule    Chest CT scan on 12/14/2008 showed a nodular opacity at the left lung base felt to most likely represent scarring.  Follow-up chest CT scan on 06/02/2010 showed parenchymal scarring in the left apex, left  lower lobe, and lingula; some of this scarring at the left base had a nodular appearance, unchanged. Chest 09/2012 - stable.  . Noncompliance   . On home oxygen therapy    "3L periodically" (08/24/2015)  . Osteoarthritis   . Pleural plaque    H/o asbestos exposure.  Chest CT on 06/02/2010 showed stable extensive calcified pleural plaques involving the left hemithorax, consistent with asbestos related pleural disease.  . Portal vein thrombosis   . Spondylosis   . STEMI (ST elevation myocardial infarction) ([place]) 10/11/2014  . Tachycardia-bradycardia syndrome [facility])    a. 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 b. [employer] Jude gen change 2010.  [name] Thrombocytopenia ([place])   . Thrombosis    History of arterial and venous thrombosis including portal vein thrombosis,  deep vein thrombosis, and superior mesenteric artery thrombosis.  . Weight loss    Normal colonoscopy by Dr. Olevia Perches on 11/06/2010.    Review of Systems:   Review of Systems  Constitutional: Positive for malaise/fatigue and weight loss. Negative for chills and fever.  Respiratory: Positive for shortness of breath. Negative for cough and sputum production.   Cardiovascular: Negative for chest pain, palpitations and leg swelling.  Gastrointestinal: Negative for blood in stool, diarrhea, melena, nausea and vomiting.       Hunger pains, decreased bowel movements  Genitourinary: Negative for dysuria and hematuria.  Musculoskeletal: Positive for joint pain. Negative for falls.  Neurological: Positive for dizziness and weakness. Negative for focal weakness and loss of consciousness.  Psychiatric/Behavioral: Positive for depression.       Occasional marijuana use. No cocaine, heroin, or IV drugs     Physical Exam:  Vitals:   04/29/16 1356  BP: 94/74  Pulse: (!) 120  SpO2: 98%  Weight: 130 lb 6.4 oz (59.1 kg)   Physical Exam  Constitutional: He is oriented to person, place, and time.  Cachectic, ill-appearing man, appears lethargic  Eyes:  No conjunctival pallor  Cardiovascular:  Tachycardic, irregularly irregular  Pulmonary/Chest: No respiratory distress. He has no wheezes. He has no rales.  Distant breath sounds which are clear to auscultation  Abdominal: Soft. He exhibits no distension. There is no guarding.  Mild tenderness RLQ  Musculoskeletal: He exhibits no edema or tenderness.  Neurological: He is alert and oriented to person, place, and time.  Skin: Skin is dry. Capillary refill takes 2 to 3 seconds. He is not diaphoretic.  Dry, cool extremities with skin tenting  Psychiatric: He exhibits a depressed mood.    Assessment & Plan:   See Encounters Tab for problem based charting.  Patient discussed with Dr. Evette Doffing +

## 2016-04-30 ENCOUNTER — Encounter (HOSPITAL_COMMUNITY): Payer: Self-pay

## 2016-04-30 DIAGNOSIS — I495 Sick sinus syndrome: Secondary | ICD-10-CM | POA: Diagnosis not present

## 2016-04-30 DIAGNOSIS — Z96643 Presence of artificial hip joint, bilateral: Secondary | ICD-10-CM | POA: Diagnosis present

## 2016-04-30 DIAGNOSIS — N183 Chronic kidney disease, stage 3 (moderate): Secondary | ICD-10-CM

## 2016-04-30 DIAGNOSIS — I5042 Chronic combined systolic (congestive) and diastolic (congestive) heart failure: Secondary | ICD-10-CM | POA: Diagnosis present

## 2016-04-30 DIAGNOSIS — I130 Hypertensive heart and chronic kidney disease with heart failure and stage 1 through stage 4 chronic kidney disease, or unspecified chronic kidney disease: Secondary | ICD-10-CM | POA: Diagnosis present

## 2016-04-30 DIAGNOSIS — E875 Hyperkalemia: Secondary | ICD-10-CM | POA: Diagnosis present

## 2016-04-30 DIAGNOSIS — K5900 Constipation, unspecified: Secondary | ICD-10-CM

## 2016-04-30 DIAGNOSIS — F17210 Nicotine dependence, cigarettes, uncomplicated: Secondary | ICD-10-CM

## 2016-04-30 DIAGNOSIS — Z79899 Other long term (current) drug therapy: Secondary | ICD-10-CM

## 2016-04-30 DIAGNOSIS — Z8249 Family history of ischemic heart disease and other diseases of the circulatory system: Secondary | ICD-10-CM

## 2016-04-30 DIAGNOSIS — Z809 Family history of malignant neoplasm, unspecified: Secondary | ICD-10-CM

## 2016-04-30 DIAGNOSIS — E0590 Thyrotoxicosis, unspecified without thyrotoxic crisis or storm: Secondary | ICD-10-CM | POA: Diagnosis present

## 2016-04-30 DIAGNOSIS — I481 Persistent atrial fibrillation: Secondary | ICD-10-CM

## 2016-04-30 DIAGNOSIS — Z950 Presence of cardiac pacemaker: Secondary | ICD-10-CM

## 2016-04-30 DIAGNOSIS — R627 Adult failure to thrive: Secondary | ICD-10-CM | POA: Diagnosis present

## 2016-04-30 DIAGNOSIS — J449 Chronic obstructive pulmonary disease, unspecified: Secondary | ICD-10-CM

## 2016-04-30 DIAGNOSIS — Z823 Family history of stroke: Secondary | ICD-10-CM

## 2016-04-30 DIAGNOSIS — Z825 Family history of asthma and other chronic lower respiratory diseases: Secondary | ICD-10-CM

## 2016-04-30 DIAGNOSIS — E43 Unspecified severe protein-calorie malnutrition: Secondary | ICD-10-CM | POA: Diagnosis present

## 2016-04-30 DIAGNOSIS — Z880 Allergy status to penicillin: Secondary | ICD-10-CM

## 2016-04-30 DIAGNOSIS — Z9981 Dependence on supplemental oxygen: Secondary | ICD-10-CM | POA: Diagnosis not present

## 2016-04-30 DIAGNOSIS — Z888 Allergy status to other drugs, medicaments and biological substances status: Secondary | ICD-10-CM | POA: Diagnosis not present

## 2016-04-30 DIAGNOSIS — Z801 Family history of malignant neoplasm of trachea, bronchus and lung: Secondary | ICD-10-CM

## 2016-04-30 DIAGNOSIS — Z7901 Long term (current) use of anticoagulants: Secondary | ICD-10-CM

## 2016-04-30 DIAGNOSIS — F319 Bipolar disorder, unspecified: Secondary | ICD-10-CM | POA: Diagnosis present

## 2016-04-30 DIAGNOSIS — N179 Acute kidney failure, unspecified: Principal | ICD-10-CM

## 2016-04-30 DIAGNOSIS — F329 Major depressive disorder, single episode, unspecified: Secondary | ICD-10-CM

## 2016-04-30 DIAGNOSIS — Z8042 Family history of malignant neoplasm of prostate: Secondary | ICD-10-CM

## 2016-04-30 LAB — BASIC METABOLIC PANEL
Anion gap: 8 (ref 5–15)
BUN: 39 mg/dL — ABNORMAL HIGH (ref 6–20)
CALCIUM: 9 mg/dL (ref 8.9–10.3)
CHLORIDE: 112 mmol/L — AB (ref 101–111)
CO2: 22 mmol/L (ref 22–32)
CREATININE: 2.04 mg/dL — AB (ref 0.61–1.24)
GFR calc Af Amer: 36 mL/min — ABNORMAL LOW (ref >60)
GFR calc non Af Amer: 31 mL/min — ABNORMAL LOW (ref >60)
GLUCOSE: 100 mg/dL — AB (ref 65–99)
Potassium: 5.2 mmol/L — ABNORMAL HIGH (ref 3.5–5.1)
Sodium: 142 mmol/L (ref 135–145)

## 2016-04-30 LAB — MRSA PCR SCREENING: MRSA by PCR: NEGATIVE

## 2016-04-30 LAB — CBC
HEMATOCRIT: 41 % (ref 39.0–52.0)
Hemoglobin: 13.1 g/dL (ref 13.0–17.0)
MCH: 30 pg (ref 26.0–34.0)
MCHC: 32 g/dL (ref 30.0–36.0)
MCV: 94 fL (ref 78.0–100.0)
Platelets: 109 10*3/uL — ABNORMAL LOW (ref 150–400)
RBC: 4.36 MIL/uL (ref 4.22–5.81)
RDW: 16.2 % — AB (ref 11.5–15.5)
WBC: 5.6 10*3/uL (ref 4.0–10.5)

## 2016-04-30 MED ORDER — SODIUM CHLORIDE 0.9 % IV SOLN
INTRAVENOUS | Status: DC
  Administered 2016-04-30: 10:00:00 via INTRAVENOUS
  Administered 2016-04-30: 1000 mL via INTRAVENOUS

## 2016-04-30 MED ORDER — APIXABAN 2.5 MG PO TABS
2.5000 mg | ORAL_TABLET | Freq: Two times a day (BID) | ORAL | Status: DC
  Administered 2016-04-30 – 2016-05-01 (×3): 2.5 mg via ORAL
  Filled 2016-04-30 (×3): qty 1

## 2016-04-30 MED ORDER — ADULT MULTIVITAMIN W/MINERALS CH
1.0000 | ORAL_TABLET | Freq: Every day | ORAL | Status: DC
  Administered 2016-04-30 – 2016-05-01 (×2): 1 via ORAL
  Filled 2016-04-30 (×2): qty 1

## 2016-04-30 MED ORDER — POLYETHYLENE GLYCOL 3350 17 G PO PACK
17.0000 g | PACK | Freq: Every day | ORAL | Status: DC
  Administered 2016-04-30 – 2016-05-01 (×2): 17 g via ORAL
  Filled 2016-04-30 (×2): qty 1

## 2016-04-30 MED ORDER — BOOST / RESOURCE BREEZE PO LIQD
1.0000 | Freq: Three times a day (TID) | ORAL | Status: DC
  Administered 2016-04-30 – 2016-05-01 (×4): 1 via ORAL

## 2016-04-30 NOTE — Progress Notes (Signed)
Pt. Hypotensive with BP 68/52. On call for IMTS notified. Pt. Currently on continuous NS infusion. No new orders received. RN will continue to monitor pt. Yvana Samonte, Katherine Roan

## 2016-04-30 NOTE — Progress Notes (Signed)
Initial Nutrition Assessment  DOCUMENTATION CODES:   Severe malnutrition in context of chronic illness, Underweight  INTERVENTION:  Provide Boost Breeze po TID, each supplement provides 250 kcal and 9 grams of protein  HS snack  Provide Might Shakes with meals, each supplement provides 500 kcal and 20 grams of protein  Provide and discussed "Suggestions for Increasing Calories and Protein" handout from the Academy of Nutrition and Dietetics  NUTRITION DIAGNOSIS:   Malnutrition related to poor appetite as evidenced by severe depletion of body fat, severe depletion of muscle mass, percent weight loss.   GOAL:   Patient will meet greater than or equal to 90% of their needs   MONITOR:   PO intake, Supplement acceptance, Labs, Weight trends, I & O's, Skin  REASON FOR ASSESSMENT:   Malnutrition Screening Tool    ASSESSMENT:   70 year old male well known to our service with PMH of Persistent Afib complicated by tachy-brady syndrome s/p PPM, Chronic combined CHF, COPD, CKD stage 3 who presented to the internal medicine clinic yesterday with complaints of decreased appetite, lethargy, and occasional dizzy spells.  Pt familiar to nutrition team from previous admissions. Weight history shows that patient has lost 26% of his body weight in the past year with 14% wt loss in the past 2 months. Pt reports the same thing has been going on for over a year. He has a very poor appetite and also has limited mobility to prepare food. He usually eats one meal or less daily such as a bowl of soup and he drinks 2 bottles of Ensure or Boost Plus daily. Pt has severe muscle wasting and severe fat wasting per nutrition-focused exam. RD discussed ways to increase calories of foods that patient usually eats and provided handout with details.   Labs: elevated potassium, elevated chloride, elevated BUN/Creatinine  Diet Order:  Diet regular Room service appropriate? Yes; Fluid consistency: Thin  Skin:   Reviewed, no issues  Last BM:  12/7  Height:   Ht Readings from Last 1 Encounters:  04/29/16 6\' 3"  (1.905 m)    Weight:   Wt Readings from Last 1 Encounters:  04/29/16 125 lb 12.8 oz (57.1 kg)    Ideal Body Weight:  89.1 kg  BMI:  Body mass index is 15.72 kg/m.  Estimated Nutritional Needs:   Kcal:  1800-2000  Protein:  85-95 grams  Fluid:  1.8 L/day  EDUCATION NEEDS:   No education needs identified at this time  [place], CSP, LDN Inpatient Clinical Dietitian Pager: [phone] After Hours Pager: [phone]

## 2016-04-30 NOTE — Progress Notes (Signed)
Internal Medicine Clinic Attending  Case discussed with Dr. Patel,Vishal at the time of the visit.  We reviewed the resident's history and exam and pertinent patient test results.  I agree with the assessment, diagnosis, and plan of care documented in the resident's note.  

## 2016-04-30 NOTE — Progress Notes (Signed)
Per teaching services metoprolol is to be given despite low blood pressure for the sake of maintaining a proper heart rate. Will give and continue to monitor. [name]

## 2016-04-30 NOTE — Progress Notes (Signed)
   Subjective: No acute events overnight. Patient ate all of his dinner  last night. He has not had a bowel movement. He denies chest pain. He denies nausea, vomiting or abdominal pain. He only complains of his chronic back pain.   Objective:  Vital signs in last 24 hours: Vitals:   04/30/16 0816 04/30/16 0830 04/30/16 0953 04/30/16 1100  BP:  (!) 86/65 (!) 80/51   Pulse:   94   Resp:    16  Temp:    97.4 F (36.3 C)  TempSrc:      SpO2: 98%   100%  Weight:      Height:       Physical Exam  Constitutional: He is oriented to person, place, and time.  Frail and cachectic  HENT:  Head: Normocephalic and atraumatic.  Cardiovascular: Normal rate and regular rhythm.   Grade 3/6 systolic murmur appreciated best at the left upper sternal border  Respiratory: Effort normal and breath sounds normal. No respiratory distress. He has no wheezes.  GI: Bowel sounds are normal. He exhibits no distension. There is no tenderness.  Musculoskeletal: He exhibits no edema.  Neurological: He is alert and oriented to person, place, and time.     Assessment/Plan:  Principal Problem:   Failure to thrive in adult Active Problems:   AKI (acute kidney injury) [facility])  [name] is a 70 year old male with multiple comorbidities who presents with continued fatigue, weight loss and failure to thrive.  1. Failure to thrive Patient presents with an approximately 15 pound weight loss, fatigue and failure to thrive. He has multiple comorbidities that could be contributing to his long-standing failure to thrive with more recent decompensation. Have tried to arrange home health in previous admission which is always refused by the patient. -- Regular diet -- Dronabinol --Normal saline at 75 mL an hour -- Ensure feeding supplement  2. Acute on chronic kidney injury Creatinine elevated 2.24 on admission. Baseline the previous several months has been approximately 1.5-1.7. The most likely etiology of the  patient's acute kidney injury is prerenal renal the setting of decreased by mouth intake. I suspect his creatinine will improve with IV hydration. Most recent creatinine of 2.04.  -- Normal saline 75 mL per hour  3. Atrial fibrillation with tachycardia-bradycardia syndrome Per pharmacy, not the patient has lost weight, we would decrease his apixaban from 5 mg twice daily to 2.5 mg twice a day. -- Apixaban 2.5mg  BID -- Metoprolol XL 150 mg once daily -- Aspirin 81 mg once daily  4. COPD -- Albuterol nebulizer every 6 hours when necessary -- Tiotropium  5. Depression -- Bupropion 75 mg once daily  6. Hyperkalemia Expect this will improve with fluids -- BMP tomorrow   6. Constipation  -- Senna, Miralax  DVT/PE prophylaxis: Anticoagulated with apixaban secondary to atrial fibrillation FEN/GI: Normal diet CODE STATUS: Full code     Dispo: Anticipated discharge this afternoon.  Ophelia Shoulder, MD 04/30/2016, 11:48 AM Pager: [phone]

## 2016-04-30 NOTE — Progress Notes (Signed)
Patient's BP 86/65, patient asymptomatic and requesting pain medication. Patient also had an order for metoprolol and to discontinue fluids. Teaching services paged and Dr. Lovena Le responded via phone call. He advised writer to give oxycodone/acetaminophen, but not the immediate release oxycodone and to continue IV fluids NS at 75. He also advised writer to hold metoprolol until further notice. Will carry out orders and continue to monitor.  [name]

## 2016-05-01 DIAGNOSIS — I495 Sick sinus syndrome: Secondary | ICD-10-CM

## 2016-05-01 LAB — BASIC METABOLIC PANEL
Anion gap: 6 (ref 5–15)
BUN: 33 mg/dL — AB (ref 6–20)
CALCIUM: 8.8 mg/dL — AB (ref 8.9–10.3)
CO2: 20 mmol/L — ABNORMAL LOW (ref 22–32)
CREATININE: 1.62 mg/dL — AB (ref 0.61–1.24)
Chloride: 116 mmol/L — ABNORMAL HIGH (ref 101–111)
GFR calc Af Amer: 47 mL/min — ABNORMAL LOW (ref >60)
GFR, EST NON AFRICAN AMERICAN: 41 mL/min — AB (ref >60)
GLUCOSE: 81 mg/dL (ref 65–99)
POTASSIUM: 4.6 mmol/L (ref 3.5–5.1)
Sodium: 142 mmol/L (ref 135–145)

## 2016-05-01 MED ORDER — APIXABAN 2.5 MG PO TABS: 2.5000 mg | ORAL_TABLET | Freq: Two times a day (BID) | ORAL | 0 refills | Status: DC

## 2016-05-01 NOTE — Progress Notes (Signed)
Page sent to MD regarding pt Blood pressure and 150mg  metoprolol dose, currently held

## 2016-05-01 NOTE — Progress Notes (Addendum)
Received a call from the nurse stating that patient is refusing to leave. His gd son who had arranged to pick him up at 9pm had to do something else and he became mad when his nurse offered to assist him with contacting other family members. Nurse states he says his car is parked outside but he does not feel like he is able to drive home. He has stable vital signs and is medically cleared to be discharged at this time.

## 2016-05-01 NOTE — Discharge Instructions (Signed)

## 2016-05-01 NOTE — Progress Notes (Signed)
   Subjective: No acute events overnight. Denies chest pain and sob. Denies abdominal pain and n/v. Is in no pain. He has no complaints this morning. Has not had a BM but is otherwise doing fine. He is at his baseline. He will be ok to d/c home this morning.   Objective:  Vital signs in last 24 hours: Vitals:   04/30/16 1100 04/30/16 2045 05/01/16 0403 05/01/16 0923  BP:  95/71 97/66   Pulse:  (!) 113 (!) 123 86  Resp: 16 20 20 18   Temp: 97.4 F (36.3 C) 98.5 F (36.9 C) 98.3 F (36.8 C)   TempSrc:  Oral Oral   SpO2: 100% 100% 99% 98%  Weight:      Height:       Physical Exam  Constitutional: He is oriented to person, place, and time.  Frail and cachectic  HENT:  Head: Normocephalic and atraumatic.  Cardiovascular: Normal rate and regular rhythm.   Grade 3/6 systolic murmur appreciated best at the left upper sternal border  Respiratory: Effort normal and breath sounds normal. No respiratory distress. He has no wheezes.  GI: Bowel sounds are normal. He exhibits no distension. There is no tenderness.  Musculoskeletal: He exhibits no edema.  Neurological: He is alert and oriented to person, place, and time.     Assessment/Plan:  Principal Problem:   AKI (acute kidney injury) ([place]) Active Problems:   Chronic combined systolic and diastolic congestive heart failure (HCC)   Longstanding persistent atrial fibrillation (HCC)   Protein-calorie malnutrition, severe   Failure to thrive in adult  [name] is a 70 year old male with multiple comorbidities who presents with continued fatigue, weight loss and failure to thrive.At baseline w/o complaints of cp or sob. Denies abdominal pain and n/v. Will d/c to home this morning.  1. Failure to thrive Patient presents with an approximately 15 pound weight loss, fatigue and failure to thrive. He has multiple comorbidities that could be contributing to his long-standing failure to thrive with more recent decompensation. Have tried to  arrange home health in previous admission which is always refused by the patient. -- Regular diet -- Dronabinol --Normal saline at 75 mL an hour -- Ensure feeding supplement  2. Acute on chronic kidney injury Creatinine elevated 2.24 on admission. Baseline the previous several months has been approximately 1.5-1.7. The most likely etiology of the patient's acute kidney injury is prerenal renal the setting of decreased by mouth intake. I suspect his creatinine will improve with IV hydration. Will f/u with Cr from this AM.  -- Normal saline 75 mL per hour  3. Atrial fibrillation with tachycardia-bradycardia syndrome Per pharmacy, not the patient has lost weight, we would decrease his apixaban from 5 mg twice daily to 2.5 mg twice a day. -- Apixaban 2.5mg  BID -- Metoprolol XL 150 mg once daily -- Aspirin 81 mg once daily  4. COPD -- Albuterol nebulizer every 6 hours when necessary -- Tiotropium  5. Depression -- Bupropion 75 mg once daily  6. Hyperkalemia Expect this will improve with fluids -- will f/u with BMP from this morning   6. Constipation  -- Senna, Miralax  DVT/PE prophylaxis: Anticoagulated with apixaban secondary to atrial fibrillation FEN/GI: Normal diet CODE STATUS: Full code     Dispo: Anticipated discharge this morning.   Ophelia Shoulder, MD 05/01/2016, 9:48 AM Pager: [phone]

## 2016-05-02 ENCOUNTER — Other Ambulatory Visit: Payer: Self-pay | Admitting: Internal Medicine

## 2016-05-02 NOTE — Progress Notes (Addendum)
   Subjective: No acute events overnight. Patient unable to obtain a ride home yesterday. He states he will be able to drive himself home today. Notes he is feeling better overall.   Objective:  Vital signs in last 24 hours: Vitals:   05/01/16 1400 05/01/16 2246 05/02/16 0525 05/02/16 0735  BP: 97/74 93/65 91/64    Pulse:  (!) 121 96   Resp:  18 18   Temp:  97.9 F (36.6 C) 98 F (36.7 C)   TempSrc:  Oral Oral   SpO2:  100% 99% 99%  Weight:   135 lb (61.2 kg)   Height:       Physical Exam  Constitutional: He is oriented to person, place, and time.  Frail and cachectic  HENT:  Head: Normocephalic and atraumatic.  Cardiovascular: Normal rate and regular rhythm.   Grade 3/6 systolic murmur appreciated best at the left upper sternal border  Respiratory: Effort normal and breath sounds normal. No respiratory distress. He has no wheezes.  GI: Bowel sounds are normal. He exhibits no distension. There is no tenderness.  Musculoskeletal: He exhibits no edema.  Neurological: He is alert and oriented to person, place, and time.     Assessment/Plan:  1. Failure to thrive Patient presents with an approximately 15 pound weight loss, fatigue and failure to thrive. He has multiple comorbidities that could be contributing to his long-standing failure to thrive with more recent decompensation. Have tried to arrange home health in previous admission which is always refused by the patient. - Discharged home - Regular diet - Dronabinol - Ensure feeding supplement  2. Acute on chronic kidney injury Creatinine elevated 2.24 on admission. Baseline the previous several months has been approximately 1.5-1.7. The most likely etiology of the patient's acute kidney injury is prerenal renal the setting of decreased by mouth intake. Cr improved to 1.62.   3. Atrial fibrillation with tachycardia-bradycardia syndrome Since the patient has lost weight, his Apixaban was decreased from 5 mg twice daily  to 2.5 mg twice a day. - Apixaban 2.5mg  BID - Metoprolol XL 150 mg once daily - Aspirin 81 mg once daily  4. COPD - Albuterol nebulizer every 6 hours when necessary - Tiotropium  5. Depression - Bupropion 75 mg once daily  6. Hyperkalemia - Resolved. Improved to 4.6.   7. Constipation  - Senna, Miralax  DVT/PE prophylaxis: Anticoagulated with apixaban secondary to atrial fibrillation FEN/GI: Normal diet CODE STATUS: Full code     Dispo: Anticipated discharge this morning.   Juliet Rude, MD 05/02/2016, 11:38 AM Pager: [phone]   Internal Medicine Attending:   I saw and examined the patient. I reviewed the resident's note and I agree with the resident's findings and plan as documented in the resident's note. Patient dressed and ready to leave on my arrival.  His ride fail thru yesterday however he is feeling better and feels he can drive himself home today.

## 2016-05-02 NOTE — Progress Notes (Signed)
Patient refused bed alarm. Will continue to monitor patient. 

## 2016-05-02 NOTE — Progress Notes (Signed)
Patient could not get a ride therefore spoke with Dr Coralee Rud who held hid D/C til this AM.

## 2016-05-03 NOTE — Discharge Summary (Signed)
Name: [name] MRN: [id number] DOB: [birth year] 70 y.o. PCP: Zada Finders, MD  Date of Admission: 04/29/2016  5:23 PM Date of Discharge: 05/03/2016 Attending Physician: No att. providers found  Discharge Diagnosis: 1. Failure to thrive 2. Acute kidney injury Principal Problem:   AKI (acute kidney injury) ([place]) Active Problems:   Chronic combined systolic and diastolic congestive heart failure (HCC)   Longstanding persistent atrial fibrillation (HCC)   Protein-calorie malnutrition, severe   Failure to thrive in adult   Discharge Medications: Allergies as of 05/02/2016      Reactions   Amiodarone Other (See Comments)   Thyroiditis in the setting of amiodarone use   Penicillins Rash   Has patient had a PCN reaction causing immediate rash, facial/tongue/throat swelling, SOB or lightheadedness with hypotension: Yes Has patient had a PCN reaction causing severe rash involving mucus membranes or skin necrosis: No Has patient had a PCN reaction that required hospitalization No Has patient had a PCN reaction occurring within the last 10 years: No If all of the above answers are "NO", then may proceed with Cephalosporin use.      Medication List    STOP taking these medications   furosemide 40 MG tablet Commonly known as:  LASIX     TAKE these medications   albuterol 108 (90 Base) MCG/ACT inhaler Commonly known as:  VENTOLIN HFA Inhale 2 puffs into the lungs every 6 (six) hours as needed for wheezing or shortness of breath.   apixaban 2.5 MG Tabs tablet Commonly known as:  ELIQUIS Take 1 tablet (2.5 mg total) by mouth 2 (two) times daily. What changed:  medication strength  how much to take   aspirin 81 MG chewable tablet Chew 1 tablet (81 mg total) by mouth daily.   bisacodyl 10 MG suppository Commonly known as:  DULCOLAX Place 1 suppository (10 mg total) rectally daily as needed for moderate constipation or severe constipation.   buPROPion 75 MG  tablet Commonly known as:  WELLBUTRIN TAKE 1 TABLET (75 MG TOTAL) BY MOUTH 2 (TWO) TIMES DAILY.   diltiazem 120 MG 24 hr capsule Commonly known as:  CARDIZEM CD Take 1 capsule (120 mg total) by mouth daily.   docusate sodium 100 MG capsule Commonly known as:  COLACE Take 1 capsule (100 mg total) by mouth 2 (two) times daily.   dronabinol 2.5 MG capsule Commonly known as:  MARINOL Take 1 capsule (2.5 mg total) by mouth 2 (two) times daily before lunch and supper.   meclizine 25 MG tablet Commonly known as:  ANTIVERT Take 25 mg by mouth daily as needed for dizziness.   metoprolol succinate 100 MG 24 hr tablet Commonly known as:  TOPROL-XL Take 1 tablet (100 mg total) by mouth daily. Take with or immediately following a meal.   metoprolol succinate 50 MG 24 hr tablet Commonly known as:  TOPROL-XL Take 1 tablet (50 mg total) by mouth daily. Take with or immediately following a meal.   omeprazole 40 MG capsule Commonly known as:  PRILOSEC Take 1 capsule (40 mg total) by mouth daily.   oxyCODONE-acetaminophen 10-325 MG tablet Commonly known as:  PERCOCET Take 1 tablet by mouth every 4 (four) hours as needed for pain. Do not take more than 5 tablets in 24 hours.   OXYGEN Inhale 3 L into the lungs as needed (shortness of breath).   rosuvastatin 20 MG tablet Commonly known as:  CRESTOR Take 1 tablet (20 mg total) by mouth daily at 6  PM. What changed:  when to take this   tiotropium 18 MCG inhalation capsule Commonly known as:  SPIRIVA Place 1 capsule (18 mcg total) into inhaler and inhale daily.       Disposition and follow-up:   [name] was discharged from [facility] in Good condition.  At the hospital follow up visit please address:  1.  Please ensure the patient is taking his nutritional supplements.  2.  Labs / imaging needed at time of follow-up: Basic metabolic panel  3.  Pending labs/ test needing follow-up: None  Follow-up  Appointments: Follow-up Information    [place]. Go on 05/15/2016.   Why:  Appointment at 3:45 pm.  Contact information: [address] [place] [facility] Course by problem list: Principal Problem:   AKI (acute kidney injury) ([place]) Active Problems:   Chronic combined systolic and diastolic congestive heart failure ([place])   Longstanding persistent atrial fibrillation (HCC)   Protein-calorie malnutrition, severe   Failure to thrive in adult   1. Failure to thrive in an adult The patient presented to the [facility] emergency department on 04/29/2016 as admission to the [facility] internal medicine teaching clinic. The patient has had an approximate 10 pound weight loss over the previous month. He endorsed increased fatigue, decreased appetite and decreased weight. In the clinic a basic metabolic panel showed a creatinine of 2.24 which is increased from the patient's baseline. He was subsequently admitted for failure to thrive and acute kidney injury. The patient was admitted and nutritional supplementation was offered to the patient. Additionally, a nutritionist came and spoke with the patient on how he can improve his daily caloric intake. Over the course of his hospital stay the patient was taking good by mouth intake. He remained afebrile and hemodynamically stable.  2. Acute kidney injury At the time of presentation the patient's creatinine was increased to 2.24. The most likely etiology of the patient's acute kidney injury is prerenal in the setting of poor by mouth intake and dehydration at home. He was started on IV fluids with normal saline during his admission. Repeat laboratory work showed resolution of the patient's acute kidney injury. He was discharged home at his baseline renal function with follow-up in clinic.  Discharge Vitals:   BP 91/64 (BP Location: Left Arm)   Pulse 96   Temp 98 F (36.7 C)  (Oral)   Resp 18   Ht 6\' 3"  (1.905 m)   Wt 135 lb (61.2 kg)   SpO2 99%   BMI 16.87 kg/m   Pertinent Labs, Studies, and Procedures:    Signed: Ophelia Shoulder, MD 05/03/2016, 7:00 AM   Pager: [phone]

## 2016-05-10 DIAGNOSIS — I4891 Unspecified atrial fibrillation: Secondary | ICD-10-CM | POA: Diagnosis not present

## 2016-05-10 DIAGNOSIS — N19 Unspecified kidney failure: Secondary | ICD-10-CM | POA: Diagnosis not present

## 2016-05-10 DIAGNOSIS — J129 Viral pneumonia, unspecified: Secondary | ICD-10-CM | POA: Diagnosis not present

## 2016-05-10 DIAGNOSIS — J449 Chronic obstructive pulmonary disease, unspecified: Secondary | ICD-10-CM | POA: Diagnosis not present

## 2016-05-10 DIAGNOSIS — R2681 Unsteadiness on feet: Secondary | ICD-10-CM | POA: Diagnosis not present

## 2016-05-10 DIAGNOSIS — M6281 Muscle weakness (generalized): Secondary | ICD-10-CM | POA: Diagnosis not present

## 2016-05-10 DIAGNOSIS — J441 Chronic obstructive pulmonary disease with (acute) exacerbation: Secondary | ICD-10-CM | POA: Diagnosis not present

## 2016-05-10 DIAGNOSIS — I5022 Chronic systolic (congestive) heart failure: Secondary | ICD-10-CM | POA: Diagnosis not present

## 2016-05-15 ENCOUNTER — Ambulatory Visit (INDEPENDENT_AMBULATORY_CARE_PROVIDER_SITE_OTHER): Payer: Commercial Managed Care - HMO | Admitting: Internal Medicine

## 2016-05-15 ENCOUNTER — Encounter: Payer: Self-pay | Admitting: Internal Medicine

## 2016-05-15 VITALS — BP 94/68 | HR 115 | Temp 97.7°F | Ht 74.0 in | Wt 133.8 lb

## 2016-05-15 DIAGNOSIS — R2681 Unsteadiness on feet: Secondary | ICD-10-CM | POA: Diagnosis not present

## 2016-05-15 DIAGNOSIS — N529 Male erectile dysfunction, unspecified: Secondary | ICD-10-CM | POA: Diagnosis not present

## 2016-05-15 DIAGNOSIS — F528 Other sexual dysfunction not due to a substance or known physiological condition: Secondary | ICD-10-CM

## 2016-05-15 DIAGNOSIS — F329 Major depressive disorder, single episode, unspecified: Secondary | ICD-10-CM | POA: Diagnosis not present

## 2016-05-15 DIAGNOSIS — Z79899 Other long term (current) drug therapy: Secondary | ICD-10-CM

## 2016-05-15 DIAGNOSIS — J449 Chronic obstructive pulmonary disease, unspecified: Secondary | ICD-10-CM | POA: Diagnosis not present

## 2016-05-15 DIAGNOSIS — F32A Depression, unspecified: Secondary | ICD-10-CM

## 2016-05-15 DIAGNOSIS — I4891 Unspecified atrial fibrillation: Secondary | ICD-10-CM | POA: Diagnosis not present

## 2016-05-15 DIAGNOSIS — I5022 Chronic systolic (congestive) heart failure: Secondary | ICD-10-CM | POA: Diagnosis not present

## 2016-05-15 DIAGNOSIS — J129 Viral pneumonia, unspecified: Secondary | ICD-10-CM | POA: Diagnosis not present

## 2016-05-15 DIAGNOSIS — J441 Chronic obstructive pulmonary disease with (acute) exacerbation: Secondary | ICD-10-CM | POA: Diagnosis not present

## 2016-05-15 DIAGNOSIS — R627 Adult failure to thrive: Secondary | ICD-10-CM | POA: Diagnosis not present

## 2016-05-15 DIAGNOSIS — N19 Unspecified kidney failure: Secondary | ICD-10-CM | POA: Diagnosis not present

## 2016-05-15 DIAGNOSIS — M6281 Muscle weakness (generalized): Secondary | ICD-10-CM | POA: Diagnosis not present

## 2016-05-15 DIAGNOSIS — I4819 Other persistent atrial fibrillation: Secondary | ICD-10-CM

## 2016-05-15 MED ORDER — BUPROPION HCL 100 MG PO TABS: 100.0000 mg | ORAL_TABLET | Freq: Two times a day (BID) | ORAL | 3 refills | Status: DC

## 2016-05-15 NOTE — Assessment & Plan Note (Signed)
Assessment: Patient was recently admitted December 11 to the 15th for failed to thrive and AKI. His aki resolved on discharge. Patient states that he has been drinking 2 ensures a day. He is unable to drink more secondary to cost of the supplement drink. He states that he has food regularly available to him but gets full easily after taking 2 bites of his food. He denies any abdominal pain. He has depression and states that this could be attributing to his decreased appetite.  Plan: We'll treat depression as discussed above. Given samples of ensure. Advised to try to drink shakes TID.

## 2016-05-15 NOTE — Patient Instructions (Signed)
Start taking wellbutrin 100mg  twice a day. We will see you back in 2 weeks. Have a great New Years!

## 2016-05-15 NOTE — Assessment & Plan Note (Signed)
Pt has difficult to control afib on cardizem and toprol. He did not take his beta blocker today b/c he was running late. His HR on recheck by myself and the blood pressure machine were in the 140's and 130's respectively. He has is BB at home and knows to take it when he gets back. Denies CP, palpitations, and SOB. He is aware to call 911 if he develops this symptoms. Will f/u in 2 weeks.

## 2016-05-15 NOTE — Assessment & Plan Note (Signed)
At the end of the exam patient states that he was previously on Viagra for erectile dysfunction. He states that he is having issues with ED currently. He would like to restart this medication. Informed him that we should work on his depression first and at his visit in 2 weeks we can discuss this.

## 2016-05-15 NOTE — Assessment & Plan Note (Signed)
Assessment: Patient's depression is on Wellbutrin 75 mg twice a day. He has tried mirtazapine before but stopped because it made him excessively drowsy. He lives alone and talks his grandson every day. He also talks to his ex-wife and children on a regular basis. He has anhedonia and reports feeling sad. Likely patient's depression is not well treated at his current dose. This could be affecting his appetite as well.  Plan: Increase the beach and to 100 mg twice a day. We'll follow up in 2 weeks to reassess and consider Wellbutrin. He does not want a therapy referral at this time.

## 2016-05-15 NOTE — Progress Notes (Signed)
CC: failure to thrive  HPI:  Mr.Jaydn F Glotfelty is a 70 y.o. with past medical history as outlined below who presents for failure to thrive follow-up. Please see problem list for further details.  Past Medical History:  Diagnosis Date  . Alcohol abuse   . Anxiety   . Avascular necrosis of bones of both hips (HCC)    Status post bilateral hip replacements  . Bipolar disorder ([place])   . CHF (congestive heart failure) ([place])   . Chronic systolic heart failure ([place])    a. NICM - EF 35% with normal cors 2003. b. Last echo 11/2012 - EF 25-30%.  . CKD (chronic kidney disease)    Renal U/S 12/04/2009 showed no pathological findings. Labs 12/04/2009 include normal ESR, C3, C4; neg ANA; SPEP showed nonspecific increase in the alpha-2 region with no M-spike; UPEP showed no monoclonal free light chains; urine IFE showed polyclonal increase in feree Kappa and/or free Lambda light chains. Baseline Cr reported 1.7-2.5.  [name] COPD (chronic obstructive pulmonary disease) ([place])   . Depression   . DVT (deep venous thrombosis) ([place])    He has hypercoagulability with multiple prior DVTs  . E coli bacteremia   . Erectile dysfunction   . Gastritis   . GERD (gastroesophageal reflux disease)   . HCAP (healthcare-associated pneumonia) 08/24/2015  . Hydrocele, unspecified   . Hyperlipemia   . Hypertension   . 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  Thyroid scan 08/28/2009 was normal with no focal areas of abnormal increased or decreased activity seen; the uptake of I 131 sodium iodide at 24 hours was 5.7%.  TSH and free T4 normalized by 08/16/2009.  . Intestinal obstruction   . Lung nodule    Chest CT scan on 12/14/2008 showed a nodular opacity at the left lung base felt to most likely represent scarring.  Follow-up chest CT scan on 06/02/2010 showed parenchymal scarring in the left apex, left  lower lobe, and lingula; some of this scarring at the left base had a nodular  appearance, unchanged. Chest 09/2012 - stable.  . Noncompliance   . On home oxygen therapy    "3L periodically" (08/24/2015)  . Osteoarthritis   . Pleural plaque    H/o asbestos exposure. Chest CT on 06/02/2010 showed stable extensive calcified pleural plaques involving the left hemithorax, consistent with asbestos related pleural disease.  . Portal vein thrombosis   . Protein calorie malnutrition ([place]) 04/2016  . Spondylosis   . STEMI (ST elevation myocardial infarction) ([place]) 10/11/2014  . 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 b. St. Jude gen change 2010.  [name] Thrombocytopenia ([place])   . 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  . Weight loss    Normal colonoscopy by Dr. Olevia Perches on 11/06/2010.    Review of Systems:  Positive for early satiety, decreased energy, anhedonia, difficulty concentrating. Denies homicidal and suicidal ideation, guilt, and insomnia. Denies chest pain, chest palpitations, shortness of breath.  Physical Exam:  Vitals:   05/15/16 1555  BP: 94/68  Pulse: (!) 115  Temp: 97.7 F (36.5 C)  TempSrc: Oral  SpO2: (!) 84%  Weight: 133 lb 12.8 oz (60.7 kg)  Height: '6\' 2"'  (1.88 m)   Physical Exam  Constitutional: oriented to person, place, and time. Cachetic. No distress.  HENT:  Head: Normocephalic and atraumatic.  Nose: Nose  normal.  Cardiovascular: tachycardic and normal heart sounds.  Exam reveals no gallop and no friction rub.   No murmur heard. Pulmonary/Chest: Effort normal and breath sounds normal. No respiratory distress.  has no wheezes.no rales.  Abdominal: Soft. Bowel sounds are normal.  exhibits no distension. There is no tenderness. There is no rebound and no guarding.  Skin: Skin is warm and dry. No rash noted.  not diaphoretic. No erythema. No pallor.   Assessment & Plan:   See Encounters Tab for problem  based charting.  Patient discussed with Dr. Daryll Drown

## 2016-05-16 ENCOUNTER — Other Ambulatory Visit: Payer: Self-pay

## 2016-05-16 DIAGNOSIS — R630 Anorexia: Secondary | ICD-10-CM

## 2016-05-16 DIAGNOSIS — G8929 Other chronic pain: Secondary | ICD-10-CM

## 2016-05-16 NOTE — Telephone Encounter (Signed)
Patient requesting refill on marinol and asking for dosage to be increased states that 2.5 mg bid is not working for him, also requesting refill oxycodone  Last visit: 05/15/2016 Last UDS: 06/27/2015 Next appointment: not scheduled Last refill: 05/01/2016

## 2016-05-18 MED ORDER — DRONABINOL 2.5 MG PO CAPS: 2.5000 mg | ORAL_CAPSULE | Freq: Two times a day (BID) | ORAL | 0 refills | Status: DC

## 2016-05-18 NOTE — Telephone Encounter (Signed)
He has requested refill too early for his Percocet. I will refill his current dose of Marinol now. Please have patient schedule an appointment with me for refill of his Percocet and to discuss dosing changes of Marinol. I should have an opening on 05/29/16.  Thank you.

## 2016-05-21 DIAGNOSIS — J129 Viral pneumonia, unspecified: Secondary | ICD-10-CM | POA: Diagnosis not present

## 2016-05-21 DIAGNOSIS — J449 Chronic obstructive pulmonary disease, unspecified: Secondary | ICD-10-CM | POA: Diagnosis not present

## 2016-05-21 DIAGNOSIS — I4891 Unspecified atrial fibrillation: Secondary | ICD-10-CM | POA: Diagnosis not present

## 2016-05-21 DIAGNOSIS — N19 Unspecified kidney failure: Secondary | ICD-10-CM | POA: Diagnosis not present

## 2016-05-21 NOTE — Telephone Encounter (Signed)
Call to patient to see if he wanted to schedule appointment to see Dr. To discuss Marinol increase. Patient states he cannot come for an appointment because he was just here 12/27 requesting that MD please call him

## 2016-05-22 ENCOUNTER — Other Ambulatory Visit: Payer: Self-pay | Admitting: *Deleted

## 2016-05-22 ENCOUNTER — Other Ambulatory Visit: Payer: Self-pay

## 2016-05-22 DIAGNOSIS — G8929 Other chronic pain: Secondary | ICD-10-CM

## 2016-05-22 DIAGNOSIS — R630 Anorexia: Secondary | ICD-10-CM

## 2016-05-22 NOTE — Patient Outreach (Signed)
[facility]) Care Management  05/22/2016  JABRILL PAYANT 04/01/1946 [id number]   Telephone Screen  Referral Date: 05/21/16 Referral Source: [place] Referral Reason: "ongoing education and disease management"    Outreach attempt # 1 to patient. Patient reached and screening completed.   Social: Patient resides in his home alone. He reports that he is fairly independent with ADLs/IADLs but that it takes him some extended time to complete task. He reports last fall was about 9 months ago. No injury sustained. DME in the home include a walker, cane, wheelchair, 3-n-1 commode, oxygen and BP monitor in the home.   Conditions: Per records, patient has PMH significant for CHF, CKD, COPD,HLD, A-Fib, pacemaker, hyperthyroidism, bipolar disorder, ETOH abuse, anxiety, avascular necrosis of hip bones s/p bilat. Hip replacement, lung nodule and MI. Patient reports that he is using oxygen at 3L/min via [place] prn. Denies any recent SOB. He has scale in the home. He states that he had been noncompliant with weighing but reports within the last month he has been consistently weighing daily due to his weight loss issues. Patient reports unintentional weight loss of 60 lbs within the last year and 10 lbs within the last month. He reports that he is 6'3 and weighs only 133 lbs. He voices that MDs have been unable to determine cause. He complains of decreased appetite. He states he is drinking Ensure supplements when he is able to afford them. He reports he is taking Marinol to help with appetite but feels like dosage needs to be increased and will address with PCP during next office visit. Patient also with some complaints of having dizzy spells and hypotension. He voices that he has BP machine in the home and checks BP and BP has been running low and he is symptomatic most of the time when this occurs. Patient has had 3 or more ER/hospital visits within the last 6 months. Most recent hospitalization  was from 04/29/16 to 05/03/16 for failure to thrive and acute kidney injury.    Medications: Patient voices taking less than 15 meds. He denies any issues with affording and/or managing his meds.    Appointments: He saw PCP on 05/15/16 and goes back for f/u visit on next week. He also sees Dr. Medical illustrator) and [employer]. Patient unsure of when next specialists appts are.   Advance Directives: None. Patient declined further info.   Consent: [facility] services reviewed and discussed. Patient familiar with services as he had them a few months ago. He is agreeable to services again.   Plan: RN CM will notify [facility] administrative assistant of case status. RN CM will send [facility] community referral for further in home eval, assessment and education/support.  Enzo Montgomery, RN,BSN,CCM [place] Management Telephonic Care Management Coordinator Direct Phone: [phone] Toll Free: [phone] Fax: [phone]

## 2016-05-22 NOTE — Telephone Encounter (Signed)
He has an appointment with me on 05/29/16. Discussed with [name] who will contact patient.

## 2016-05-22 NOTE — Telephone Encounter (Signed)
Needs to speak with a nurse regarding meds.  

## 2016-05-23 ENCOUNTER — Other Ambulatory Visit: Payer: Self-pay | Admitting: *Deleted

## 2016-05-23 NOTE — Patient Outreach (Signed)
[facility]) Care Management  05/23/2016  [name] March 02, 1946 [id number]   Referral received from telephonic care manager for [place] involvement and transition of care as member has had more than 3 admits since September.  His last admit was 12/11 - 12/15, admitted for acute kidney injury.  According to chart, he also has a history of congestive heart failure, COPD, hypertension, and atrial fibrillation.  Member has been involved with [facility] in the past.  Call placed to initiate involvement, but he state that this is not a good time to talk.  He request this care manager to call back later.  Will follow up this afternoon or tomorrow.  [name], [place], MSN [place] [phone]

## 2016-05-24 ENCOUNTER — Other Ambulatory Visit: Payer: Self-pay | Admitting: *Deleted

## 2016-05-24 NOTE — Patient Outreach (Signed)
[facility]) Care Management  05/24/2016  [name] [birth year] [id number]   Call placed to member today to initiate [facility] services as he was busy yesterday.  Identity verified.  This care manager introduced self and purpose of call.  [facility] care management services explained.  He is familiar with services as he has been involved as recent as November 2017.  He state that he feel "ok" but state that his appetite remains decreased.  He state that he does drink Ensure intermittently, but has not been able to afford it on a consistent basis.  He state that his daughter provides it for him "from time to time" but he is not able to drink them daily.  Discussed other sources of protein such as protein powder, he again state that finances are an issue.  He state that he has applied for meals on wheels but is on the wait list.  He does not know where he is on the list.    He confirms that he has had one follow up with his primary MD team, has another appointment scheduled for next week.  He state that his nephew will transport him to this appointment, but does have concern regarding consistent transportation.  He state that he uses a wheelchair and that it does not always fit in the cars.  He state that he has thought about using [employer] but has not applied for it.  Member report that a concern of his is personal care, particularly getting dressed, cooking and cleaning.  He does not have medicaid, but he report that he does have VA benefits.    He report compliance with medications.  Patient was recently discharged from hospital and all medications have been reviewed.  Denies questions.  He denies any urgent concerns at this time, does agree to home visit next week.  Will place referral to social worker for assistance with community resources.  [name], [place], MSN [place] [phone]

## 2016-05-24 NOTE — Progress Notes (Signed)
Internal Medicine Clinic Attending  Case discussed with Dr. Truong soon after the resident saw the patient.  We reviewed the resident's history and exam and pertinent patient test results.  I agree with the assessment, diagnosis, and plan of care documented in the resident's note. 

## 2016-05-27 ENCOUNTER — Encounter: Payer: Self-pay | Admitting: *Deleted

## 2016-05-28 ENCOUNTER — Other Ambulatory Visit: Payer: Self-pay

## 2016-05-28 NOTE — Telephone Encounter (Signed)
Pt needs to speak with a nurse regarding meds. Please call back.

## 2016-05-29 ENCOUNTER — Ambulatory Visit (INDEPENDENT_AMBULATORY_CARE_PROVIDER_SITE_OTHER): Payer: Commercial Managed Care - HMO | Admitting: Internal Medicine

## 2016-05-29 ENCOUNTER — Ambulatory Visit: Payer: Commercial Managed Care - HMO

## 2016-05-29 VITALS — BP 96/60 | Temp 97.4°F | Ht 75.0 in | Wt 133.5 lb

## 2016-05-29 DIAGNOSIS — R627 Adult failure to thrive: Secondary | ICD-10-CM

## 2016-05-29 DIAGNOSIS — F332 Major depressive disorder, recurrent severe without psychotic features: Secondary | ICD-10-CM

## 2016-05-29 DIAGNOSIS — I481 Persistent atrial fibrillation: Secondary | ICD-10-CM

## 2016-05-29 DIAGNOSIS — Z681 Body mass index (BMI) 19 or less, adult: Secondary | ICD-10-CM

## 2016-05-29 DIAGNOSIS — M549 Dorsalgia, unspecified: Secondary | ICD-10-CM | POA: Diagnosis not present

## 2016-05-29 DIAGNOSIS — F329 Major depressive disorder, single episode, unspecified: Secondary | ICD-10-CM

## 2016-05-29 DIAGNOSIS — I4811 Longstanding persistent atrial fibrillation: Secondary | ICD-10-CM

## 2016-05-29 DIAGNOSIS — F1120 Opioid dependence, uncomplicated: Secondary | ICD-10-CM

## 2016-05-29 DIAGNOSIS — R630 Anorexia: Secondary | ICD-10-CM

## 2016-05-29 DIAGNOSIS — F32A Depression, unspecified: Secondary | ICD-10-CM

## 2016-05-29 DIAGNOSIS — G8929 Other chronic pain: Secondary | ICD-10-CM

## 2016-05-29 DIAGNOSIS — Z79899 Other long term (current) drug therapy: Secondary | ICD-10-CM

## 2016-05-29 DIAGNOSIS — M25569 Pain in unspecified knee: Secondary | ICD-10-CM

## 2016-05-29 DIAGNOSIS — Z7901 Long term (current) use of anticoagulants: Secondary | ICD-10-CM

## 2016-05-29 DIAGNOSIS — Z79891 Long term (current) use of opiate analgesic: Secondary | ICD-10-CM

## 2016-05-29 DIAGNOSIS — M25559 Pain in unspecified hip: Secondary | ICD-10-CM

## 2016-05-29 MED ORDER — DRONABINOL 5 MG PO CAPS: 5.0000 mg | ORAL_CAPSULE | Freq: Two times a day (BID) | ORAL | 2 refills | Status: DC

## 2016-05-29 MED ORDER — OXYCODONE-ACETAMINOPHEN 10-325 MG PO TABS
1.0000 | ORAL_TABLET | ORAL | 0 refills | Status: DC | PRN
Start: 2016-05-29 — End: 2016-05-29

## 2016-05-29 MED ORDER — BUPROPION HCL 100 MG PO TABS: 100.0000 mg | ORAL_TABLET | Freq: Three times a day (TID) | ORAL | 3 refills | Status: DC

## 2016-05-29 MED ORDER — OXYCODONE-ACETAMINOPHEN 10-325 MG PO TABS: 1.0000 | ORAL_TABLET | ORAL | 0 refills | Status: DC | PRN

## 2016-05-29 MED ORDER — DICLOFENAC SODIUM 1 % TD GEL: 4.0000 g | Freq: Four times a day (QID) | TRANSDERMAL | 2 refills | Status: DC

## 2016-05-29 NOTE — Progress Notes (Signed)
CC: Depression  HPI:  Mr.Brenan F Olden is a 71 y.o. male with PMH as listed below who presents for follow up management of his Depression/Failure to Thrive. Please see problem based charting for status of patient's chronic medical issues.  Depression/Failure to Thrive/Decreased Appetite: Patient with low mood and continued depression. His Bupropion was increased from 75 mg BID to 100 mg BID 1 month ago with slight subjective improvement. He has continued difficulties with sleep, low energy, appetite, low libido, and motivation to do his daily activities. He denies any suicidal or homicidal ideations. He reports discontinuing recreational marijuana use. He takes Dronabinol 2.5 mg BID for appetite stimulation which only provides some effect.   Atrial Fibrillation: Patient with chronically difficult to control Afib. Currently taking Toprol XL 150 mg daily with fair control. He has failed Amiodarone and Multaq in the past. Previously on Cardizem which was discontinued on one of his recent hospitalizations due to his low EF and fairly controlled Afib on Toprol alone. He is taking Eliquis 2.5 mg BID for anticoagulation.  Chronic Pain with opioid dependence: On chronic narcotics due to chronic back, hip, and knee pain. He takes Percocet 10-325 mg, 1 tablet every 4 hours as needed for pain. He renewed a pain contract with me in March 2017.        Past Medical History:  Diagnosis Date  . Alcohol abuse   . Anxiety   . Avascular necrosis of bones of both hips (HCC)    Status post bilateral hip replacements  . Bipolar disorder ([place])   . CHF (congestive heart failure) ([place])   . Chronic systolic heart failure ([place])    a. NICM - EF 35% with normal cors 2003. b. Last echo 11/2012 - EF 25-30%.  . CKD (chronic kidney disease)    Renal U/S 12/04/2009 showed no pathological findings. Labs 12/04/2009 include normal ESR, C3, C4; neg ANA; SPEP showed nonspecific increase in the alpha-2 region with no  M-spike; UPEP showed no monoclonal free light chains; urine IFE showed polyclonal increase in feree Kappa and/or free Lambda light chains. Baseline Cr reported 1.7-2.5.  [name] COPD (chronic obstructive pulmonary disease) ([place])   . Depression   . DVT (deep venous thrombosis) ([place])    He has hypercoagulability with multiple prior DVTs  . E coli bacteremia   . Erectile dysfunction   . Gastritis   . GERD (gastroesophageal reflux disease)   . HCAP (healthcare-associated pneumonia) 08/24/2015  . Hydrocele, unspecified   . Hyperlipemia   . Hypertension   . 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  Thyroid scan 08/28/2009 was normal with no focal areas of abnormal increased or decreased activity seen; the uptake of I 131 sodium iodide at 24 hours was 5.7%.  TSH and free T4 normalized by 08/16/2009.  . Intestinal obstruction   . Lung nodule    Chest CT scan on 12/14/2008 showed a nodular opacity at the left lung base felt to most likely represent scarring.  Follow-up chest CT scan on 06/02/2010 showed parenchymal scarring in the left apex, left  lower lobe, and lingula; some of this scarring at the left base had a nodular appearance, unchanged. Chest 09/2012 - stable.  . Noncompliance   . On home oxygen therapy    "3L periodically" (08/24/2015)  . Osteoarthritis   . Pleural plaque    H/o asbestos exposure. Chest CT on 06/02/2010 showed stable extensive calcified pleural plaques involving the left hemithorax, consistent with  asbestos related pleural disease.  . Portal vein thrombosis   . Protein calorie malnutrition ([place]) 04/2016  . Spondylosis   . STEMI (ST elevation myocardial infarction) ([place]) 10/11/2014  . 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 b. St[employer] Jude gen change 2010.  [name] Thrombocytopenia ([place])   . Thrombosis    History of arterial and venous thrombosis including portal vein thrombosis, deep vein thrombosis, and superior  mesenteric artery thrombosis.  . Weight loss    Normal colonoscopy by Dr. Olevia Perches on 11/06/2010.    Review of Systems:   Review of Systems  Constitutional: Positive for malaise/fatigue. Negative for chills and fever.  HENT: Negative for nosebleeds.   Respiratory: Negative for hemoptysis and shortness of breath.   Cardiovascular: Negative for chest pain and palpitations.  Gastrointestinal: Negative for blood in stool, melena, nausea and vomiting.  Musculoskeletal: Positive for back pain. Negative for falls.  Neurological: Negative for focal weakness and loss of consciousness.  Psychiatric/Behavioral: Positive for depression. Negative for substance abuse and suicidal ideas. The patient has insomnia.      Physical Exam:  Vitals:   05/29/16 1615  BP: 96/60  Temp: 97.4 F (36.3 C)  TempSrc: Oral  SpO2: 100%  Weight: 133 lb 8 oz (60.6 kg)  Height: 6' 3" (1.905 m)   Physical Exam  Constitutional: He is oriented to person, place, and time. No distress.  Thin, chronically ill appearing man  Cardiovascular:  Slight tachycardia, irregularly irregular rhythm  Pulmonary/Chest: Effort normal. No respiratory distress. He has no wheezes. He has no rales.  Musculoskeletal: He exhibits no edema.  Neurological: He is alert and oriented to person, place, and time.  Skin: Skin is warm. He is not diaphoretic.  Psychiatric: His behavior is normal. He exhibits a depressed mood.    Assessment & Plan:   See Encounters Tab for problem based charting.  Patient discussed with Dr. Dareen Piano

## 2016-05-29 NOTE — Patient Instructions (Addendum)
It was a pleasure to see you again [name].  I am increasing your Wellbutrin to 100 mg three times a day.  I am increasing your Marinol to 5 mg twice a day.  I will add Voltaren gel for your joint pain. You can use this 4 times a day as needed.  Please continue the Ensure as available and try to eat what you can.  Continue your other medications as prescribed.

## 2016-05-30 ENCOUNTER — Other Ambulatory Visit: Payer: Self-pay

## 2016-05-30 ENCOUNTER — Other Ambulatory Visit: Payer: Self-pay | Admitting: *Deleted

## 2016-05-30 NOTE — Telephone Encounter (Signed)
Called in rx for [facility]ronabinol to [facility] & was told it will be ready @ 1pm. Informed patient.

## 2016-05-30 NOTE — Patient Outreach (Signed)
[place] [facility]) Care Management  05/30/2016  MCRAE DRACE [birth year] [id number]   Member with scheduled home visit at today at 1pm.  Call placed to member to again confirm availability, no answer.  HIPAA compliant voice message left.  This care manager proceeded to home as scheduled.  Call placed through apartment intercom system, no answer.  Call placed again to member's listed preferred number, no answer.  Another HIPAA compliant voice message left.  Will await call back.  If no call back, will follow up next week.  [name], [place], MSN [place] [phone]

## 2016-05-30 NOTE — Telephone Encounter (Signed)
dronabinol (MARINOL) 5 MG capsule, is not at the pharmacy. Please call pt back.

## 2016-06-03 ENCOUNTER — Other Ambulatory Visit: Payer: Self-pay | Admitting: *Deleted

## 2016-06-03 ENCOUNTER — Encounter: Payer: Self-pay | Admitting: *Deleted

## 2016-06-03 NOTE — Assessment & Plan Note (Signed)
On chronic narcotics due to chronic back, hip, and knee pain. He takes Percocet 10-325 mg, 1 tablet every 4 hours as needed for pain, #150/month. He renewed a pain contract with me in March 2017.  [place] [facility] is reviewed and is appropriate. Refilled his current prescription. Three separate prescriptions are provided. He is advised not to take more than 5 tablets of his Percocet in a 24 hour period.

## 2016-06-03 NOTE — Assessment & Plan Note (Signed)
See Depression tab in Problem List. Will increase Dronabinol to 5 mg BID, continue Ensure.

## 2016-06-03 NOTE — Assessment & Plan Note (Signed)
Patient with chronically difficult to control Afib. Currently taking Toprol XL 150 mg daily with fair control. He has failed Amiodarone and Multaq in the past. Previously on Cardizem which was discontinued on one of his recent hospitalizations due to his low EF and fairly controlled Afib on Toprol alone. He is taking Eliquis 2.5 mg BID for anticoagulation.  Heart rate is between 110-120s which is about as well controlled as has been able for him without depressing his cardiac function or blood pressure. Will continue with Toprol XL 150 mg daily and renal dosed Eliqius 2.5 mg BID.

## 2016-06-03 NOTE — Patient Outreach (Signed)
[place] [facility]) Care Management  06/03/2016  VEDANT MENDIAS June 25, 1945 [id number]   Call placed to follow up on missed home visit last week.  Member apologizes for not being available, state that a family emergency came up.  He does agree to reschedule home visit for tomorrow.  Will proceed tomorrow with initial home visit.  [name], [place], MSN [place] [phone]

## 2016-06-03 NOTE — Assessment & Plan Note (Signed)
Patient with low mood and continued depression. His Bupropion was increased from 75 mg BID to 100 mg BID 1 month ago with slight subjective improvement. He has continued difficulties with sleep, low energy, appetite, low libido, and motivation to do his daily activities. He denies any suicidal or homicidal ideations. He reports discontinuing recreational marijuana use. He takes Dronabinol 2.5 mg BID for appetite stimulation which only provides some effect.  PHQ-9 is 16 indicating moderately severe depression. His depression is negatively impacting his ability to care for himself. He does have family that checks in on him a couple times per week. I think he is a good candidate for the PACE program and referred him a few months ago. Status of this referral is unknown. He has [facility] assistance for the time being as well. Will try increasing his Bupropion for better control. We will schedule him for an advanced care planning appointment as well. -Increase Bupropion to 100 mg TID -Increase Dronabinol to 5 mg BID for appetite -PHQ-9 score 16; repeat on follow up -Continue Ensure -Schedule Advanced Care Planning appointment -f/u on PACE referral status

## 2016-06-04 ENCOUNTER — Other Ambulatory Visit: Payer: Self-pay | Admitting: *Deleted

## 2016-06-04 ENCOUNTER — Encounter: Payer: Self-pay | Admitting: *Deleted

## 2016-06-04 NOTE — Telephone Encounter (Signed)
Pt seen in office on 05/29/2016

## 2016-06-04 NOTE — Patient Outreach (Addendum)
[place] [facility]) Care Management   06/04/2016  CRAIGORY TOSTE 12-Sep-1945 [id number]  BROUGHTON EPPINGER is an 71 y.o. male  Subjective:   Member reports that he does not feel well today.  State he is having 8/10 pain as well as just feeling tired.  He has not had anything to eat today, stating that he has not felt up to standing long enough to fix himself something to eat.  He does have some Ensure, but denies drinking one of those today.  He state that his godson visits him every other day, and make sure that he has something to eat, due to visit this afternoon.  He is provided with an ensure from his fridge, encouraged increase oral intake in effort to increase strength & increase weight (recent weight loss).  He report compliance with medications, uses pill box for administration.    Objective:   Review of Systems  Constitutional: Positive for malaise/fatigue and weight loss.  HENT: Negative.   Eyes: Negative.   Respiratory: Negative.   Cardiovascular: Negative.   Gastrointestinal: Negative.   Genitourinary: Negative.   Musculoskeletal: Positive for joint pain.  Skin: Negative.   Neurological: Positive for weakness.  Endo/Heme/Allergies: Negative.   Psychiatric/Behavioral: Positive for depression.    Physical Exam  Constitutional: He is oriented to person, place, and time.  Neck: Normal range of motion.  Cardiovascular:  Tachycardia  Respiratory: Effort normal and breath sounds normal.  GI: Soft. Bowel sounds are normal.  Musculoskeletal: Normal range of motion.  Neurological: He is alert and oriented to person, place, and time.  Skin: Skin is warm and dry.   BP (!) 92/54   Pulse (!) 110   Resp 20   Ht 1.905 m ('6\' 3"' )   Wt 133 lb (60.3 kg)   BMI 16.62 kg/m   Encounter Medications:   Outpatient Encounter Prescriptions as of 06/04/2016  Medication Sig Note  . albuterol (VENTOLIN HFA) 108 (90 BASE) MCG/ACT inhaler Inhale 2 puffs into the lungs every 6 (six)  hours as needed for wheezing or shortness of breath.   [name] apixaban (ELIQUIS) 2.5 MG TABS tablet Take 1 tablet (2.5 mg total) by mouth 2 (two) times daily.   [name] aspirin 81 MG chewable tablet Chew 1 tablet (81 mg total) by mouth daily.   . bisacodyl (DULCOLAX) 10 MG suppository Place 1 suppository (10 mg total) rectally daily as needed for moderate constipation or severe constipation.   [name] buPROPion (WELLBUTRIN) 100 MG tablet Take 1 tablet (100 mg total) by mouth 3 (three) times daily.   [name] docusate sodium (COLACE) 100 MG capsule Take 1 capsule (100 mg total) by mouth 2 (two) times daily. 06/04/2016: As needed  . dronabinol (MARINOL) 5 MG capsule Take 1 capsule (5 mg total) by mouth 2 (two) times daily before lunch and supper.   . meclizine (ANTIVERT) 25 MG tablet Take 25 mg by mouth 2 (two) times daily as needed for dizziness.   . metoprolol succinate (TOPROL-XL) 100 MG 24 hr tablet Take 1 tablet (100 mg total) by mouth daily. Take with or immediately following a meal. 04/29/2016: In addition to 76m daily = 1563m . metoprolol succinate (TOPROL-XL) 50 MG 24 hr tablet Take 1 tablet (50 mg total) by mouth daily. Take with or immediately following a meal. 04/29/2016: In addition to 10033m 150m58m mirtazapine (REMERON) 15 MG tablet Take 15 mg by mouth at bedtime.   . omMarland Kitchenprazole (PRILOSEC) 40 MG capsule Take 1  capsule (40 mg total) by mouth daily.   [name] oxyCODONE-acetaminophen (PERCOCET) 10-325 MG tablet Take 1 tablet by mouth every 4 (four) hours as needed for pain. Do not take more than 5 tablets in 24 hours.   . OXYGEN Inhale 3 L into the lungs as needed (shortness of breath).   . rosuvastatin (CRESTOR) 20 MG tablet Take 1 tablet (20 mg total) by mouth daily at 6 PM. (Patient taking differently: Take 20 mg by mouth at bedtime. )   . tiotropium (SPIRIVA) 18 MCG inhalation capsule Place 1 capsule (18 mcg total) into inhaler and inhale daily.   . diclofenac sodium (VOLTAREN) 1 % GEL Apply 4 g topically 4  (four) times daily. (Patient not taking: Reported on 06/04/2016)    No facility-administered encounter medications on file as of 06/04/2016.     Functional Status:   In your present state of health, do you have any difficulty performing the following activities: 06/04/2016 05/29/2016  Hearing? [name]  Vision? Y Y  Difficulty concentrating or making decisions? [name]  Walking or climbing stairs? Y Y  Dressing or bathing? Y N  Doing errands, shopping? [name]  Preparing Food and eating ? Y -  Using the Toilet? N -  In the past six months, have you accidently leaked urine? Y -  Do you have problems with loss of bowel control? N -  Managing your Medications? N -  Managing your Finances? Y -  Housekeeping or managing your Housekeeping? Y -  Some recent data might be hidden    Fall/Depression Screening:    PHQ 2/9 Scores 06/04/2016 05/29/2016 05/22/2016 05/15/2016 04/29/2016 02/23/2016 02/16/2016  PHQ - 2 Score 2 2 0 '2 6 1 6  ' PHQ- 9 Score 15 13 - 12 16 - 18  Exception Documentation - - - - - - -   Fall Risk  06/04/2016 05/29/2016 05/29/2016 05/22/2016 05/15/2016  Falls in the past year? Yes Yes Yes Yes Yes  Number falls in past yr: 1 2 or more 2 or more 2 or more 2 or more  Injury with Fall? No No No No No  Risk Factor Category  High Fall Risk - High Fall Risk High Fall Risk High Fall Risk  Risk for fall due to : Impaired balance/gait;Impaired mobility;History of fall(s) - Impaired balance/gait;Impaired vision;Medication side effect History of fall(s);Impaired balance/gait;Impaired vision;Impaired mobility;Medication side effect History of fall(s);Impaired balance/gait  Risk for fall due to (comments): - - - - -  Follow up Falls prevention discussed - - Education provided -    Assessment:    Met with member at scheduled time, consent available in chart.  He is aware that his last hospitalization was due to kidney problems, report dehydration.  He is advised of the dangers of decreased oral intake, increase  risk of dehydration.  Medications reviewed, he still has Furosemide in bag with active medications, but he denies taking it.  This care manager removed it from the bag as to decrease risk of confusion, all active meds placed in bag together.  He report being able to independently fill pill box weekly.    His major concerns are being able to have more assistance in the home, particularly someone to help preparing meals.  He is aware that referral has been placed with LCSW.  He has history of depression that is already being treated, Wellbutrin recently increased.  He denies having much social involvement, state that he was referred for the PACE program, but was  denied due to marijuana use.  He is not interested in PACE due to this reason.  He also continue to smoke cigarettes although he has containers of oxygen in the same room.  He was advised against it, but he state "I don't smoke with it on.  I've been smoking in here for I don't know how many years and nothing has happened, but I guess something can always happen."    Member has low blood pressure, consistent with last office visits.  He take 150 Toprol daily.  He state that he does not currently check his blood pressure daily, advised to do so.  He has functioning monitor, provided with [facility] calendar tool book with blood pressure log.  Member denies any other concerns at this time, denies need to seek emergency medical attention, eager to speak with LCSW.  Advised that he is scheduled for telephone outreach tomorrow.  He is made aware that if he is unable to answer phone with contacted, it is imperative to provide call back.  He verbalizes understanding.  Contact information for this care manager provided, advised to contact with questions.  Plan:   Will follow up next week with weekly transition of care call. Will send primary MD update on today's visit, including PHQ9 score of 15 Will follow up with LCSW in collaboration of plan of care.  [facility]  CM Care Plan Problem One   Flowsheet Row Most Recent Value  Care Plan Problem One  Risk for readmission as evidenced by recent admission  Role Documenting the Problem One  Care Management [facility] for Problem One  Active  [facility] Long Term Goal (31-90 days)  Pt will not have any hospitalizations within the next 31 days  [facility]N Long Term Goal Start Date  05/24/16  Interventions for Problem One Long Term Goal  Discussed with member the importance of following discharge instructions, including follow up appointments, medications, diet, to decrease the risk of readmission  [facility] CM Short Term Goal #1 (0-30 days)  Pt will take all prescribed medication within the next 4 weeks  [facility] CM Short Term Goal #1 Start Date  05/24/16  Interventions for Short Term Goal #1  Discussed the importance of taking all prescribed medication to decrease risk for readmission, medications reviewed.  [facility] CM Short Term Goal #2 (0-30 days)  Patient will keep and attend follow up appointment with primary MD within the next week  [facility] CM Short Term Goal #2 Start Date  05/24/16  [facility] CM Short Term Goal #2 Met Date  06/04/16  Interventions for Short Term Goal #2  Discussed with member the importance of follow up with PCP in effort to decrease risk of readmisison.  Transportation confirmed with member.       [name], [place], MSN [place] [phone]

## 2016-06-04 NOTE — Progress Notes (Signed)
Internal Medicine Clinic Attending  Case discussed with Dr. Patel,Vishal at the time of the visit.  We reviewed the resident's history and exam and pertinent patient test results.  I agree with the assessment, diagnosis, and plan of care documented in the resident's note.  

## 2016-06-05 ENCOUNTER — Other Ambulatory Visit: Payer: Self-pay | Admitting: *Deleted

## 2016-06-05 NOTE — Patient Outreach (Signed)
[place] [facility]) Care Management  06/05/2016  [name] 1945/06/22 [id number]   CSW made an initial attempt to try and contact patient today to perform phone assessment, as well as assess and assist with social needs and services, without success.  A HIPAA complaint message was left for patient on voicemail.  CSW is currently awaiting a return call.  CSW will make a second outreach attempt to patient within the next week, if CSW does not receive a return call from patient in the meantime. [name], BSW, MSW, LCSW  Licensed Education officer, environmental Health System  Mailing [place] [address] [place], [place] [address] Physical Address-[name], [place], [place] [address] Toll Free Main # [phone] Fax # [phone] Cell # [phone]  Fax # [phone]  [name]

## 2016-06-07 ENCOUNTER — Encounter: Payer: Self-pay | Admitting: *Deleted

## 2016-06-10 DIAGNOSIS — I4891 Unspecified atrial fibrillation: Secondary | ICD-10-CM | POA: Diagnosis not present

## 2016-06-10 DIAGNOSIS — J129 Viral pneumonia, unspecified: Secondary | ICD-10-CM | POA: Diagnosis not present

## 2016-06-10 DIAGNOSIS — I5022 Chronic systolic (congestive) heart failure: Secondary | ICD-10-CM | POA: Diagnosis not present

## 2016-06-10 DIAGNOSIS — N19 Unspecified kidney failure: Secondary | ICD-10-CM | POA: Diagnosis not present

## 2016-06-10 DIAGNOSIS — J441 Chronic obstructive pulmonary disease with (acute) exacerbation: Secondary | ICD-10-CM | POA: Diagnosis not present

## 2016-06-10 DIAGNOSIS — J449 Chronic obstructive pulmonary disease, unspecified: Secondary | ICD-10-CM | POA: Diagnosis not present

## 2016-06-10 DIAGNOSIS — M6281 Muscle weakness (generalized): Secondary | ICD-10-CM | POA: Diagnosis not present

## 2016-06-10 DIAGNOSIS — R2681 Unsteadiness on feet: Secondary | ICD-10-CM | POA: Diagnosis not present

## 2016-06-12 ENCOUNTER — Other Ambulatory Visit: Payer: Self-pay | Admitting: *Deleted

## 2016-06-12 NOTE — Patient Outreach (Signed)
[place] [facility]) Care Management  06/12/2016  [name] 01-04-1946 [id number]   CSW made a second attempt to try and contact patient today to perform phone assessment, as well as assess and assist with social needs and services, without success.  A HIPAA complaint message was left for patient on voicemail.  CSW is currently awaiting a return call.  CSW will make a third and final outreach attempt in one week, if CSW does not receive a return call from patient in the meantime. [name], BSW, MSW, LCSW  Licensed Education officer, environmental [facility] System  Mailing [place] [address], [place], [place] [address] Physical Address-[address], [place], [place] [address] Toll Free Main # [phone] Fax # [phone] Cell # [phone]  Office # [phone] [name]

## 2016-06-13 ENCOUNTER — Other Ambulatory Visit: Payer: Self-pay | Admitting: *Deleted

## 2016-06-13 NOTE — Patient Outreach (Addendum)
[place] [facility]) Care Management  06/13/2016  [name] 1945-08-15 [id number]   Weekly transition of care call placed to member, no answer.  HIPAA compliant voice message left.  Will await call back, if no call back will follow up next week.   Update @ 1600:  Call received back from member.  He state that he is still doing about the same.  He report compliance with his medications, state his appetite remains decreased.  His expresses his biggest concern as having someone in the home to assist with meals and other ADLs.  He denies being in contact with social worker yet.  He is made aware that according to notes, LCSW, J. Saporito, has attempted to contact him twice in the past week without success.  He is encourged to contact as soon as possible, contact information provided.  Advised to leave message if Mrs. Saporito is unable to answer the phone at that time of the call.  He verbalizes understanding.  He denies any urgent concerns, advised to contact with questions.  Will follow up next week.  [name], [place], MSN [place] [phone]

## 2016-06-15 DIAGNOSIS — M6281 Muscle weakness (generalized): Secondary | ICD-10-CM | POA: Diagnosis not present

## 2016-06-15 DIAGNOSIS — N19 Unspecified kidney failure: Secondary | ICD-10-CM | POA: Diagnosis not present

## 2016-06-15 DIAGNOSIS — I5022 Chronic systolic (congestive) heart failure: Secondary | ICD-10-CM | POA: Diagnosis not present

## 2016-06-15 DIAGNOSIS — R2681 Unsteadiness on feet: Secondary | ICD-10-CM | POA: Diagnosis not present

## 2016-06-15 DIAGNOSIS — J449 Chronic obstructive pulmonary disease, unspecified: Secondary | ICD-10-CM | POA: Diagnosis not present

## 2016-06-15 DIAGNOSIS — J441 Chronic obstructive pulmonary disease with (acute) exacerbation: Secondary | ICD-10-CM | POA: Diagnosis not present

## 2016-06-15 DIAGNOSIS — I4891 Unspecified atrial fibrillation: Secondary | ICD-10-CM | POA: Diagnosis not present

## 2016-06-15 DIAGNOSIS — J129 Viral pneumonia, unspecified: Secondary | ICD-10-CM | POA: Diagnosis not present

## 2016-06-17 ENCOUNTER — Ambulatory Visit (INDEPENDENT_AMBULATORY_CARE_PROVIDER_SITE_OTHER): Payer: Medicare HMO | Admitting: Internal Medicine

## 2016-06-17 ENCOUNTER — Encounter: Payer: Self-pay | Admitting: Internal Medicine

## 2016-06-17 VITALS — BP 100/64 | HR 133 | Ht 75.0 in | Wt 131.0 lb

## 2016-06-17 DIAGNOSIS — I495 Sick sinus syndrome: Secondary | ICD-10-CM

## 2016-06-17 DIAGNOSIS — I428 Other cardiomyopathies: Secondary | ICD-10-CM | POA: Diagnosis not present

## 2016-06-17 DIAGNOSIS — I4891 Unspecified atrial fibrillation: Secondary | ICD-10-CM

## 2016-06-17 DIAGNOSIS — I5042 Chronic combined systolic (congestive) and diastolic (congestive) heart failure: Secondary | ICD-10-CM

## 2016-06-17 MED ORDER — METOPROLOL SUCCINATE ER 100 MG PO TB24: 100.0000 mg | ORAL_TABLET | Freq: Two times a day (BID) | ORAL | 3 refills | Status: DC

## 2016-06-17 NOTE — Progress Notes (Signed)
PCP: Zada Finders, MD  PARLEY PIDCOCK is a 71 y.o. male who presents today for electrophysiology followup.  Continues to have difficulty with AF with RVR.  He also has failure to thrive with poor appetite and significant weight loss.  Today, he denies symptoms of palpitations, chest pain, lower extremity edema, presyncope, or syncope.  The patient is otherwise without complaint today.   Past Medical History:  Diagnosis Date  . Alcohol abuse   . Anxiety   . Avascular necrosis of bones of both hips (HCC)    Status post bilateral hip replacements  . Bipolar disorder ([place])   . CHF (congestive heart failure) ([place])   . Chronic systolic heart failure ([place])    a. NICM - EF 35% with normal cors 2003. b. Last echo 11/2012 - EF 25-30%.  . CKD (chronic kidney disease)    Renal U/S 12/04/2009 showed no pathological findings. Labs 12/04/2009 include normal ESR, C3, C4; neg ANA; SPEP showed nonspecific increase in the alpha-2 region with no M-spike; UPEP showed no monoclonal free light chains; urine IFE showed polyclonal increase in feree Kappa and/or free Lambda light chains. Baseline Cr reported 1.7-2.5.  [name] COPD (chronic obstructive pulmonary disease) ([place])   . Depression   . DVT (deep venous thrombosis) ([place])    He has hypercoagulability with multiple prior DVTs  . E coli bacteremia   . Erectile dysfunction   . Gastritis   . GERD (gastroesophageal reflux disease)   . HCAP (healthcare-associated pneumonia) 08/24/2015  . Hydrocele, unspecified   . Hyperlipemia   . Hypertension   . 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  Thyroid scan 08/28/2009 was normal with no focal areas of abnormal increased or decreased activity seen; the uptake of I 131 sodium iodide at 24 hours was 5.7%.  TSH and free T4 normalized by 08/16/2009.  . Intestinal obstruction   . Lung nodule    Chest CT scan on 12/14/2008 showed a nodular opacity at the left lung base felt to most likely  represent scarring.  Follow-up chest CT scan on 06/02/2010 showed parenchymal scarring in the left apex, left  lower lobe, and lingula; some of this scarring at the left base had a nodular appearance, unchanged. Chest 09/2012 - stable.  . Noncompliance   . On home oxygen therapy    "3L periodically" (08/24/2015)  . Osteoarthritis   . Pleural plaque    H/o asbestos exposure. Chest CT on 06/02/2010 showed stable extensive calcified pleural plaques involving the left hemithorax, consistent with asbestos related pleural disease.  . Portal vein thrombosis   . Protein calorie malnutrition ([place]) 04/2016  . Spondylosis   . STEMI (ST elevation myocardial infarction) ([place]) 10/11/2014  . 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 b. St. Jude gen change 2010.  [name] Thrombocytopenia ([place])   . 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  . Weight loss    Normal colonoscopy by Dr. Olevia Perches on 11/06/2010.   Past Surgical History:  Procedure Laterality Date  . CARDIAC CATHETERIZATION  2003   Nl cors, EF 35%  . CARDIOVERSION N/A 05/27/2013   Procedure: CARDIOVERSION;  Surgeon: Candee Furbish, MD;  Location: [facility] ENDOSCOPY;  Service: Cardiovascular;  Laterality: N/A;  . EMBOLECTOMY Right 12/10/2013   Procedure: Right Femoral Embolectomy; Fasciotomy Right Lower Leg with Intraoperative arteriogram;  Surgeon: Rosetta Posner, MD;  Location: [facility] OR;  Service: Vascular;  Laterality: Right;  . EMBOLECTOMY Left 10/12/2014   Procedure: Left Femoral Thrombectomy with intraoperative arteriogram;  Surgeon: Rosetta Posner, MD;  Location: [place];  Service: Vascular;  Laterality: Left;  . FASCIOTOMY CLOSURE Right 12/15/2013   Procedure: LEG FASCIOTOMY CLOSURE;  Surgeon: Rosetta Posner, MD;  Location: [place];  Service: Vascular;  Laterality: Right;  . HEMIARTHROPLASTY HIP Left 09/09/2002   bipolar; Dr. Lafayette Dragon  . INSERT / REPLACE / REMOVE PACEMAKER  02/2003   Dr. Roe Rutherford, [employer]. [employer] Medical pulse generator identity SR model [id number] serial [id number]; gen change by Greggory Brandy  . INSERT / REPLACE / REMOVE PACEMAKER  06/17/2008   Lead and generator change w/ St. Jude Medical Tendril ST model [id number] (serial  [id number]).       . INTRAOPERATIVE ARTERIOGRAM Right 12/10/2013   Procedure: INTRA OPERATIVE ARTERIOGRAM;  Surgeon: Rosetta Posner, MD;  Location: [place];  Service: Vascular;  Laterality: Right;  . JOINT REPLACEMENT    . TEE WITHOUT CARDIOVERSION N/A 05/27/2013   Procedure: TRANSESOPHAGEAL ECHOCARDIOGRAM (TEE);  Surgeon: Candee Furbish, MD;  Location: [facility] ENDOSCOPY;  Service: Cardiovascular;  Laterality: N/A;  . TOTAL HIP ARTHROPLASTY Right 03/08/2008    By Dr. Hiram Comber    ROS- all systems are reviewed and negative except as per HPI above  Current Outpatient Prescriptions  Medication Sig Dispense Refill  . albuterol (VENTOLIN HFA) 108 (90 BASE) MCG/ACT inhaler Inhale 2 puffs into the lungs every 6 (six) hours as needed for wheezing or shortness of breath. 3 Inhaler 3  . apixaban (ELIQUIS) 2.5 MG TABS tablet Take 1 tablet (2.5 mg total) by mouth 2 (two) times daily. 60 tablet 0  . aspirin 81 MG chewable tablet Chew 1 tablet (81 mg total) by mouth daily. 30 tablet 12  . bisacodyl (DULCOLAX) 10 MG suppository Place 1 suppository (10 mg total) rectally daily as needed for moderate constipation or severe constipation. 12 suppository 0  . buPROPion (WELLBUTRIN) 100 MG tablet Take 1 tablet (100 mg total) by mouth 3 (three) times daily. 60 tablet 3  . docusate sodium (COLACE) 100 MG capsule Take 1 capsule (100 mg total) by mouth 2 (two) times daily. 10 capsule 0  . dronabinol (MARINOL) 5 MG capsule Take 1 capsule (5 mg total) by mouth 2 (two) times daily before lunch and supper. 60 capsule 2  . meclizine (ANTIVERT) 25 MG tablet Take 25 mg by mouth 2 (two) times daily as needed for dizziness.    . metoprolol  succinate (TOPROL-XL) 100 MG 24 hr tablet Take 1 tablet (100 mg total) by mouth daily. Take with or immediately following a meal. 90 tablet 3  . metoprolol succinate (TOPROL-XL) 50 MG 24 hr tablet Take 1 tablet (50 mg total) by mouth daily. Take with or immediately following a meal. 90 tablet 3  . mirtazapine (REMERON) 15 MG tablet Take 15 mg by mouth at bedtime.    [name] omeprazole (PRILOSEC) 40 MG capsule Take 1 capsule (40 mg total) by mouth daily. 90 capsule 3  . oxyCODONE-acetaminophen (PERCOCET) 10-325 MG tablet Take 1 tablet by mouth every 4 (four) hours as needed for pain. Do not take more than 5 tablets in 24 hours. 150 tablet 0  . OXYGEN Inhale 3 L into the lungs as needed (shortness of breath).    . rosuvastatin (CRESTOR) 20 MG tablet Take 1 tablet (20 mg total) by mouth daily at 6 PM. (Patient taking differently: Take  20 mg by mouth at bedtime. ) 90 tablet 3  . tiotropium (SPIRIVA) 18 MCG inhalation capsule Place 1 capsule (18 mcg total) into inhaler and inhale daily. 90 capsule 3   No current facility-administered medications for this visit.     Physical Exam: Vitals:   06/17/16 1612  BP: 100/64  Pulse: (!) 133  Weight: 131 lb (59.4 kg)  Height: _0  (1.905 m)    GEN- The patient is chronically ill appearing, very thin,  alert and oriented x 3 today.   Head- normocephalic, atraumatic Eyes-  Sclera clear, conjunctiva pink Ears- hearing intact Oropharynx- clear Lungs- clear, normal work of breathing Chest- pacemaker pocket is well healed Heart- irregular tachycardic rhythm  GI- soft, NT, ND, + BS Extremities- no clubbing, cyanosis, or edema  Pacemaker interrogation- reviewed in detail today,  See PACEART report ekg today reveals typical appearing atrial flutter, V rate 133 bpm, nonspecific ST/T changes  Assessment and Plan:  1. Bradycardia/ tachycardia syndrome Normal pacemaker function See Pace Art report No changes today  2. Afib/ atrial flutter Longstanding  persistent He has failed multaq, tikosyn, and amiodarone Unfortunately rate control has been a challenge. He has a rate related cardiomyopathy Continue anticoagulation long term I previously attempted an atrial flutter ablation but was unable to achieve access to his heart from the femoral vein due to venous occlusive disease. Epicardial ablation is about the only option that we have at this time. I have previously referred to Dr Roxy Manns.  Upon review of his chart by Dr Roxy Manns, he was felt to be a poor candidate for surgery. Today I will increase toprol XL to 127m BID  3. Chronic systolic dysfunction Likely tachycardia mediated unfortunately given that he has failed medical therapy and has venous occlusions, our options are limited Given recurrent infections and bacteremia, I would not advise AV nodal ablation for this patient. Return to see me in 6 weeks   Today, I have spent 40 minutes with the patient discussing atrial fibrillation management .  More than 50% of the visit time today was spent on this issue.  JJeneen RinksAllred,MD

## 2016-06-17 NOTE — Patient Instructions (Signed)
Medication Instructions:  Your physician has recommended you make the following change in your medication:  1) Increase Metoprolol to 100 mg twice daily   Labwork: None ordered   Testing/Procedures: None ordered   Follow-Up: Your physician recommends that you schedule a follow-up appointment in: 6 weeks with Dr Rayann Heman   Any Other Special Instructions Will Be Listed Below (If Applicable).     If you need a refill on your cardiac medications before your next appointment, please call your pharmacy.

## 2016-06-18 ENCOUNTER — Other Ambulatory Visit: Payer: Self-pay | Admitting: *Deleted

## 2016-06-18 ENCOUNTER — Encounter: Payer: Self-pay | Admitting: *Deleted

## 2016-06-18 LAB — CUP PACEART INCLINIC DEVICE CHECK
Battery Voltage: 2.79 V
Date Time Interrogation Session: 20180129221744
Implantable Lead Location: 753860
Implantable Pulse Generator Implant Date: 20100129
Lead Channel Impedance Value: 430 Ohm
Lead Channel Pacing Threshold Amplitude: 0.75 V
Lead Channel Pacing Threshold Pulse Width: 0.5 ms
Lead Channel Setting Pacing Pulse Width: 0.5 ms
MDC IDC LEAD IMPLANT DT: 20100129
MDC IDC MSMT BATTERY IMPEDANCE: 1000 Ohm — AB
MDC IDC MSMT LEADCHNL RV SENSING INTR AMPL: 7.4 mV
MDC IDC PG SERIAL: 2045834
MDC IDC SET LEADCHNL RV PACING AMPLITUDE: 2.5 V
MDC IDC SET LEADCHNL RV SENSING SENSITIVITY: 2.5 mV
MDC IDC STAT BRADY RV PERCENT PACED: 1 % — AB
Pulse Gen Model: 5626

## 2016-06-18 NOTE — Patient Outreach (Signed)
[place] [facility]) Care Management  06/18/2016  JORRELL DILDINE 1945/10/27 [id number]  CSW made a third and final attempt to try and contact patient today to perform phone assessment, as well as assess and assist with social work needs and services, without success.  A HIPAA compliant message was left for patient on voicemail.  CSW continues to await a return call.  CSW will mail an outreach letter to patient's home, encouraging patient to contact CSW at their earliest convenience, if patient is interested in receiving social work services through [place] with [facility].  If CSW does not receive a return call from patient within the next 10 business days, CSW will proceed with case closure.  Required number of phone attempts will have been made and outreach letter mailed.   [name], BSW, MSW, LCSW  Licensed Education officer, environmental Health System  Mailing [place] [address], [place], [place] [address] Physical Address-[address], [place], [place] [address] Toll Free Main # [phone] Fax # [phone] Cell # [phone]  Office # [phone] [name]

## 2016-06-20 ENCOUNTER — Other Ambulatory Visit: Payer: Self-pay | Admitting: *Deleted

## 2016-06-20 NOTE — Patient Outreach (Signed)
[facility]) Care Management  06/20/2016  [name] 02-05-1946 [id number]   Weekly transition of care call placed to member.  He state that he has been doing "alright."  He report that he did attend his cardiology appointment yesterday and they are concerned with his heart rate.  He state that his Metoprolol has been increased to 100mg  twice a day, reports compliance.  He denies any other health concerns at this time.  He does question about contact with social worker regarding personal care services and food assistance.  He state that they have been missing each other, both leaving messages.  He is advised to call again today in attempt to contact regarding concerns.  He verbalizes understanding.   Member report that he has some prescriptions for him at the pharmacy, however he has a copay that he is unable to afford.  He state he has Marinol, Metoprolol (new dose, he is taking the older prescription to equal the new dose until able to obtain new dose), and Bupropion.  He report that he filled his pill box yesterday and have another 1-1.5 weeks left of Bupropion.  He state that he didn't have a copay at all last year.  Discussed possible change in insurance and potential deductible, he denies any changes.  He is open to pharmacy referral for further review.  Will place pharmacy referral, will follow up with member next week.  [name], [place], MSN [place] [phone]

## 2016-06-21 DIAGNOSIS — I4891 Unspecified atrial fibrillation: Secondary | ICD-10-CM | POA: Diagnosis not present

## 2016-06-21 DIAGNOSIS — N19 Unspecified kidney failure: Secondary | ICD-10-CM | POA: Diagnosis not present

## 2016-06-21 DIAGNOSIS — J449 Chronic obstructive pulmonary disease, unspecified: Secondary | ICD-10-CM | POA: Diagnosis not present

## 2016-06-21 DIAGNOSIS — J129 Viral pneumonia, unspecified: Secondary | ICD-10-CM | POA: Diagnosis not present

## 2016-06-24 ENCOUNTER — Other Ambulatory Visit: Payer: Self-pay | Admitting: Pharmacist

## 2016-06-24 NOTE — Patient Outreach (Signed)
[facility]) Care Management  [place]   06/24/2016  [name] May 22, 1945 [id number]  Subjective: Patient was called regarding medication assistance per referral from [place], [place].  HIPAA identifiers were obtained.  Patient has multiple medical conditions including but not limited to:  Atrial Fibrillation, CHF, COPD, CKD stage 3, and failure to thrive.  Patient was hospitalized 12/17 for atrial fibrillation and failure to thrive.  He has visited the ED and been subsequently hospitalized several times over the past calendar year.  Patient reported extreme difficulty affording his medications.  He reported the doses of both bupropion and metoprolol had been increased and that he was running very low on his medications.   Objective:   Encounter Medications: Outpatient Encounter Prescriptions as of 06/24/2016  Medication Sig Note  . albuterol (VENTOLIN HFA) 108 (90 BASE) MCG/ACT inhaler Inhale 2 puffs into the lungs every 6 (six) hours as needed for wheezing or shortness of breath.   [name] apixaban (ELIQUIS) 2.5 MG TABS tablet Take 1 tablet (2.5 mg total) by mouth 2 (two) times daily.   [name] aspirin 81 MG chewable tablet Chew 1 tablet (81 mg total) by mouth daily.   [name] buPROPion (WELLBUTRIN) 100 MG tablet Take 1 tablet (100 mg total) by mouth 3 (three) times daily.   [name] dronabinol (MARINOL) 5 MG capsule Take 1 capsule (5 mg total) by mouth 2 (two) times daily before lunch and supper. 06/24/2016: Patient is out of this medication today.  . meclizine (ANTIVERT) 25 MG tablet Take 25 mg by mouth 2 (two) times daily as needed for dizziness. 06/24/2016: PRN only  . metoprolol succinate (TOPROL-XL) 100 MG 24 hr tablet Take 1 tablet (100 mg total) by mouth 2 (two) times daily. Take with or immediately following a meal.   . mirtazapine (REMERON) 15 MG tablet Take 15 mg by mouth at bedtime. 06/24/2016: Does not take this every day.  [name] omeprazole (PRILOSEC) 40 MG capsule Take 1  capsule (40 mg total) by mouth daily.   [name] oxyCODONE-acetaminophen (PERCOCET) 10-325 MG tablet Take 1 tablet by mouth every 4 (four) hours as needed for pain. Do not take more than 5 tablets in 24 hours.   . OXYGEN Inhale 3 L into the lungs as needed (shortness of breath).   . rosuvastatin (CRESTOR) 20 MG tablet Take 1 tablet (20 mg total) by mouth daily at 6 PM. (Patient taking differently: Take 20 mg by mouth at bedtime. )   . tiotropium (SPIRIVA) 18 MCG inhalation capsule Place 1 capsule (18 mcg total) into inhaler and inhale daily.   . bisacodyl (DULCOLAX) 10 MG suppository Place 1 suppository (10 mg total) rectally daily as needed for moderate constipation or severe constipation. (Patient not taking: Reported on 06/24/2016)   . docusate sodium (COLACE) 100 MG capsule Take 1 capsule (100 mg total) by mouth 2 (two) times daily. (Patient not taking: Reported on 06/24/2016)    No facility-administered encounter medications on file as of 06/24/2016.     Functional Status: In your present state of health, do you have any difficulty performing the following activities: 06/04/2016 05/29/2016  Hearing? [name]  Vision? Y Y  Difficulty concentrating or making decisions? [name]  Walking or climbing stairs? Y Y  Dressing or bathing? Y N  Doing errands, shopping? [name]  Preparing Food and eating ? Y -  Using the Toilet? N -  In the past six months, have you accidently leaked urine? Y -  Do you have problems with loss of bowel control? N -  Managing your Medications? N -  Managing your Finances? Y -  Housekeeping or managing your Housekeeping? Y -  Some recent data might be hidden    Fall/Depression Screening: PHQ 2/9 Scores 06/04/2016 05/29/2016 05/22/2016 05/15/2016 04/29/2016 02/23/2016 02/16/2016  PHQ - 2 Score 2 2 0 2 6 1 6   PHQ- 9 Score 15 13 - 12 16 - 18  Exception Documentation - - - - - - -    Assessment: Medications were reviewed with the patient via telephone.   Drugs sorted by  system:  Neurologic/Psychologic: Bupropion Remeron (patient reports only taking this PRN)  Cardiovascular: Aspirin Eliquis Metoprolol Rosuvastatin  Pulmonary/Allergy: Albuterol Oxygen Spiriva  Gastrointestinal: Omeprazole Bisacodyl (does not use) Docusate Sodium (does not use)  Pain: Oxycodone/Acetaminophen   Vitamins/Minerals:  Infectious Diseases:  Miscellaneous: Meclizine 25mg  Dronabinol 5mg    Medication Assistance:  Patient has [employer] Medicare Gold Plus and as Full Subsidy (full extra help) .  As such the maximum a generic medication will cost him is $3.35 and the maximum a generic medication will cost him is  $8.35.  [facility] and the technician, Lanelle Bal confirmed patient has three medications ready for pick up:  Dronabinol, Metoprolol and Bupropion.  Each have a $3.35 copay with a total of $10.95.  The patient expressed difficulty affording the $10.95 and said that he is running low on Metoprolol and is completely out of Bupropion. Both medication doses were increased at provider visits last month.  A call was placed to investigate [facility] available funding.  [facility] funding to help with medications cannot be used for controlled drugs. In addition, when patients have "extra help", financial assistance with copays is not usually done on a continual basis.  It was recommended the patient complete the home visit with [place] Worker so his individual financial needs can be assessed.)   Plan:  1.  Follow up with the patient after his Home Visit with the [place] Worker to assess financial needs  Elayne Guerin, PharmD, [place] Clinical Pharmacist [phone]  .

## 2016-06-25 ENCOUNTER — Other Ambulatory Visit: Payer: Self-pay | Admitting: *Deleted

## 2016-06-25 ENCOUNTER — Encounter: Payer: Self-pay | Admitting: *Deleted

## 2016-06-25 NOTE — Patient Outreach (Signed)
[place] [facility]) Care [name]  06/25/2016  [name] 08-09-1945 [id number]   CSW was able to make initial contact with patient today to perform phone assessment, as well as assess and assist with social work needs and services.  CSW introduced self, explained role and types of services provided through [name] ([name]).  CSW further explained to patient that CSW works with patient's RNCM, also with [name], [name]. CSW then explained the reason for the call, indicating that [name] thought that patient would benefit from social work services and resources to assist with finances, as well as a referral for [name] through [employer] [facility] [place].  CSW obtained two HIPAA compliant identifiers from patient, which included patient's name and date of birth. Patient admits, "I have a lot of needs"; therefore, CSW agreed to schedule an initial home visit with patient on Thursday, February 8th at 3:00pm to address all of patient's concerns.  CSW is aware that patient is frail and has had a significant decrease in appetite, resulting in even more weight loss.  Patient admits that he recently lost his Food Stamps through the [place], so CSW will assist patient with calling his Adult Medicaid Case Worker to check the status.  In addition, CSW will make a referral for patient to [name] through [employer] [facility], as patient is agreeable to receiving one free hot meal per day, but understands that they are currently on a waiting list. In addition, patient reports that he often experiences symptoms of Depression and is agreeable to having CSW offer counseling and supportive services until CSW is able to get patient connected with a therapist in the community.  Patient indicated that he cannot afford to pay for services, being on a very fixed income, but is  agreeable to receiving free services through [facility].  Patient is currently taking psychotropic medications, which are being monitored by his Primary Care Physician, Dr. Zada [name].  CSW will also talk with patient about deep breathing exercises and relaxation techniques to utilize when patient becomes anxious and depressed. Last, patient reported that he is not always able to perform all of his activities of daily living independently, requesting assistance obtaining home care services and assistance.  Patient had been referred to PACE ([facility] [place] [facility]) and ACE ([place] [facility]) in the past, but was denied due to intermittent Marijuana and Tobacco use and abuse.  Patient does not wish to eliminate either from his life at the present time, reporting that he uses both of them for medicinal purposes.  CSW will assist patient with completion of an application for [facility] ([place]), as they will work with patient's based on a sliding scale fee.  Patient is not eligible to receive PCS ([place]) through [facility] or [facility]), as patient is not an active Adult Medicaid recipient.  Patient voiced understanding and was agreeable to the above named plans for treatment. [name], BSW, MSW, LCSW  Licensed Education officer, environmental Health System  Mailing [place] [address] [place], [place] [address] Physical Address-[address], [place], [place] [address] Toll Free Main # [phone] Fax # [phone] Cell # [phone]  Office # [phone] [name]

## 2016-06-25 NOTE — Patient Outreach (Signed)
[facility]) Care Management  06/25/2016  [name] Jul 05, 1945 [id number]   Weekly transition of care call placed to member.  He report that he is doing "about the same."  He state that he has increased his Metoprolol as instructed, but his heart rate remains elevated, yesterday 140s, blood pressure 100/60.  He state that he was told that "not much else" could be done to control his heart rate.  He is scheduled for follow up with cardiologist within the next 4 weeks.  He is advised to continue to monitor blood pressure and heart rate and record to present trends at follow up appointment.  He report that he has been contacted [facility] pharmacist and they are working on medication assistance.  He denies contact with Education officer, museum, but continue to express concern regarding the need for assistance (personal care and food).  He is advised again to call LCSW to discuss needs/concerns. He verbalizes understanding.  Call placed to Mrs. [place], LCSW, to provide update on concerns.  She will attempt contact again today.  Home visit scheduled for next week.  [name], [place], MSN [place] [phone]

## 2016-06-26 ENCOUNTER — Other Ambulatory Visit: Payer: Self-pay | Admitting: Pharmacist

## 2016-06-26 ENCOUNTER — Other Ambulatory Visit: Payer: Self-pay | Admitting: *Deleted

## 2016-06-26 DIAGNOSIS — I4811 Longstanding persistent atrial fibrillation: Secondary | ICD-10-CM

## 2016-06-26 MED ORDER — APIXABAN 2.5 MG PO TABS: 2.5000 mg | ORAL_TABLET | Freq: Two times a day (BID) | ORAL | 11 refills | Status: DC

## 2016-06-26 NOTE — Patient Outreach (Signed)
[name]) Care Management  06/26/2016  RUDOLPHE TESTERMAN Sep 16, 1945 [id number]   Call received from member inquiring about medication assistance.  He state that he is completely out of medications.  This care manager spoke to [facility] pharmacist, Denyse Amass, to obtain update on progress of assistance.  Member made aware per pharmacist, process of medication assistance is contingent upon financial needs assessment, which will be done tomorrow with the LCSW's initial home visit.  Mrs. Luciana Axe will follow up with member once assessment is complete.    [name], [place], MSN [place] [phone]

## 2016-06-27 ENCOUNTER — Other Ambulatory Visit: Payer: Self-pay | Admitting: *Deleted

## 2016-06-27 NOTE — Patient Outreach (Signed)
[place] [facility]) Care Management  06/27/2016  [name] Jul 08, 1945 [id number]   CSW was scheduled to meet with patient today for an initial home visit, as well as assist with referrals to various community agencies and resources; however, patient was unavailable at the time of CSW's arrival.  CSW knocked several times and waited several minutes before attempting to contact patient by phone.   CSW was able to make contact with patient, after a short period of time, only to have patient admit that he remembered that he was supposed to meet with CSW today, he just could not remember the time of the appointment.  Patient indicated that he received a call from his son, reporting that he needed to be picked up from school; therefore, patient left the house without a second though.  CSW agreed to await patient's arrival home, but patient denied, indicating that he needed to then stop by the pharmacy to pick up his medications.  Patient requested to reschedule the initial home visit.   CSW will plan to meet with patient for the initial home visit on Tuesday, February 12th at 3:00pm.  CSW agreed to contact patient the morning of the scheduled visit to offer a gentle reminder.  CSW will also converse with patient's RNCM with [place] [facility], [name], who is scheduled to meet with patient for a routine visit at 2:00pm, that same day, to ensure that she is agreeable to having CSW's visit overlap with her own. [name], BSW, MSW, LCSW  Licensed Education officer, environmental Health System  Mailing [place] [address] [place], [place] [address] Physical Address-[address], [place], [place] [address] Toll Free Main # [phone] Fax # [phone] Cell # [phone]  Office # [phone] [name]

## 2016-07-02 ENCOUNTER — Other Ambulatory Visit: Payer: Self-pay | Admitting: *Deleted

## 2016-07-02 ENCOUNTER — Ambulatory Visit: Payer: Self-pay | Admitting: *Deleted

## 2016-07-02 ENCOUNTER — Ambulatory Visit: Payer: Medicare HMO | Admitting: *Deleted

## 2016-07-02 NOTE — Patient Outreach (Signed)
[place] [facility]) Care Management  07/02/2016  DEMARIO RISBERG 12-05-1945 [id number]   Call received from [place], stating she called to confirm home visits today with member.  Unfortunately, he is unable to meet today until after 4.  This care manager and Mrs. Saporito are unavailable at the requested time.    Call then placed to member to reschedule home visit, no answer.  HIPAA compliant voice message left.  Will await call back.  [name], [place], MSN [place] [phone]

## 2016-07-02 NOTE — Patient Outreach (Signed)
[place] [facility]) Care Management  07/02/2016  [name] 01/06/1946 [id number]   CSW was able to make contact with patient today, wanting to ensure that patient would be available for his 2:00pm appointment with [name], RNCM and 3:00pm appointment with CSW, both with [place] [facility]; however, patient denied availability.  Patient reported that he needed to leave the house before 3:00pm to pick up his son from school and that he would not be returning home until after 4:00pm, unable to provide a definite time.  CSW explained to patient that CSW would need to reschedule the initial visit, as CSW would need at least two hours to complete the visit, as well as assist with referrals to community agencies and resources.  Patient voiced understanding and was agreeable to rescheduling.  CSW explained to patient that CSW will be out-of-the-office beginning at 5:00pm on Wednesday, February 14th, not returning until 8:30am on Monday, February 26th.  CSW and patient were able to schedule the initial home visit for Thursday, March 1st at 11:00am.  CSW left a message for [name], explaining to her that patient would not be available for their 2:00pm appointment, but to contact patient directly if she wished to reschedule the appointment for after 4:00pm today. [name], BSW, MSW, LCSW  Licensed Education officer, environmental Health System  Mailing [place] [address] [place], [place] [address] Physical Address-[address], [place], [place] [address] Toll Free Main # [phone] Fax # [phone] Cell # [phone]  Office # [phone] [name]

## 2016-07-03 ENCOUNTER — Other Ambulatory Visit: Payer: Self-pay | Admitting: Pharmacist

## 2016-07-03 NOTE — Patient Outreach (Signed)
[place] [facility]) Care Management  07/03/2016  [name] 1945-06-10 [id number]   Patient was called regarding medication assistance. HIPAA identifiers were obtained. [name] qualifies for "extra help" from Social Security to help with affordability of his medications.  He had expressed difficulty affording his copays for several medications even with "extra help".  An inquiry was placed to assist patient through [facility] but a financial assessment is required to become eligible.  In addition, financial assistance is not available for two of the medications patient requested because they are control substances (oxycodone/APAP and Marinol ).  Unfortunately, [name] did not complete the financial assessment with [facility] Social Worker due to a scheduling conflict on his part. Just after verifying the HIPAA identifiers during our call today, patient stated that he was on another important call and could not speak with me right then.    Plan:  Call patient back tomorrow.  Elayne Guerin, PharmD, [place] Clinical Pharmacist [phone]

## 2016-07-04 ENCOUNTER — Other Ambulatory Visit: Payer: Self-pay | Admitting: *Deleted

## 2016-07-04 NOTE — Patient Outreach (Signed)
[place] [facility]) Care Management  07/04/2016  [name] Feb 12, 1946 [id number]   Call placed to member in attempt to reschedule missed home visit earlier this week.  No answer, HIPAA compliant voice message left.  Will follow up next week.  [name], [place], MSN [place] [phone]

## 2016-07-08 ENCOUNTER — Other Ambulatory Visit: Payer: Self-pay | Admitting: *Deleted

## 2016-07-08 NOTE — Patient Outreach (Signed)
[place] [facility]) Care Management  07/08/2016  KERWIN POEHLMAN Jan 30, 1946 [id number]   Call placed to member to reschedule home visit, no answer.  HIPAA compliant voice message left.  Will await call back, if no call back, will follow up next week.  [name], [place], MSN [place] [phone]

## 2016-07-11 DIAGNOSIS — J129 Viral pneumonia, unspecified: Secondary | ICD-10-CM | POA: Diagnosis not present

## 2016-07-11 DIAGNOSIS — J441 Chronic obstructive pulmonary disease with (acute) exacerbation: Secondary | ICD-10-CM | POA: Diagnosis not present

## 2016-07-11 DIAGNOSIS — I4891 Unspecified atrial fibrillation: Secondary | ICD-10-CM | POA: Diagnosis not present

## 2016-07-11 DIAGNOSIS — R2681 Unsteadiness on feet: Secondary | ICD-10-CM | POA: Diagnosis not present

## 2016-07-11 DIAGNOSIS — N19 Unspecified kidney failure: Secondary | ICD-10-CM | POA: Diagnosis not present

## 2016-07-11 DIAGNOSIS — J449 Chronic obstructive pulmonary disease, unspecified: Secondary | ICD-10-CM | POA: Diagnosis not present

## 2016-07-11 DIAGNOSIS — M6281 Muscle weakness (generalized): Secondary | ICD-10-CM | POA: Diagnosis not present

## 2016-07-11 DIAGNOSIS — I5022 Chronic systolic (congestive) heart failure: Secondary | ICD-10-CM | POA: Diagnosis not present

## 2016-07-16 ENCOUNTER — Other Ambulatory Visit: Payer: Self-pay | Admitting: *Deleted

## 2016-07-16 ENCOUNTER — Telehealth: Payer: Self-pay | Admitting: Internal Medicine

## 2016-07-16 ENCOUNTER — Encounter: Payer: Self-pay | Admitting: *Deleted

## 2016-07-16 DIAGNOSIS — R2681 Unsteadiness on feet: Secondary | ICD-10-CM | POA: Diagnosis not present

## 2016-07-16 DIAGNOSIS — I5022 Chronic systolic (congestive) heart failure: Secondary | ICD-10-CM | POA: Diagnosis not present

## 2016-07-16 DIAGNOSIS — J441 Chronic obstructive pulmonary disease with (acute) exacerbation: Secondary | ICD-10-CM | POA: Diagnosis not present

## 2016-07-16 DIAGNOSIS — J129 Viral pneumonia, unspecified: Secondary | ICD-10-CM | POA: Diagnosis not present

## 2016-07-16 DIAGNOSIS — M6281 Muscle weakness (generalized): Secondary | ICD-10-CM | POA: Diagnosis not present

## 2016-07-16 DIAGNOSIS — I4891 Unspecified atrial fibrillation: Secondary | ICD-10-CM | POA: Diagnosis not present

## 2016-07-16 DIAGNOSIS — J449 Chronic obstructive pulmonary disease, unspecified: Secondary | ICD-10-CM | POA: Diagnosis not present

## 2016-07-16 DIAGNOSIS — N19 Unspecified kidney failure: Secondary | ICD-10-CM | POA: Diagnosis not present

## 2016-07-16 NOTE — Patient Outreach (Signed)
[place] [facility]) Care Management  07/16/2016  [name] 20-Dec-1945 [id number]   Call placed to reschedule missed home visit, no answer.  HIPAA compliant voice message left.  Will await call back.  This is the 3rd consecutive unsuccessful attempt to contact member, will send outreach letter.  If no response in 10 days will close case.  [name], [place], MSN [place] [phone]

## 2016-07-16 NOTE — Telephone Encounter (Signed)
APT. REMINDER CALL, LMTCB °

## 2016-07-17 ENCOUNTER — Ambulatory Visit: Payer: Commercial Managed Care - HMO | Admitting: Dietician

## 2016-07-17 ENCOUNTER — Ambulatory Visit (INDEPENDENT_AMBULATORY_CARE_PROVIDER_SITE_OTHER): Payer: Medicare HMO | Admitting: Internal Medicine

## 2016-07-17 ENCOUNTER — Encounter: Payer: Self-pay | Admitting: Internal Medicine

## 2016-07-17 VITALS — BP 94/77 | HR 70 | Temp 97.6°F | Ht 75.0 in | Wt 129.4 lb

## 2016-07-17 DIAGNOSIS — F329 Major depressive disorder, single episode, unspecified: Secondary | ICD-10-CM | POA: Diagnosis not present

## 2016-07-17 DIAGNOSIS — Z950 Presence of cardiac pacemaker: Secondary | ICD-10-CM | POA: Diagnosis not present

## 2016-07-17 DIAGNOSIS — I481 Persistent atrial fibrillation: Secondary | ICD-10-CM

## 2016-07-17 DIAGNOSIS — J449 Chronic obstructive pulmonary disease, unspecified: Secondary | ICD-10-CM | POA: Diagnosis not present

## 2016-07-17 DIAGNOSIS — G8929 Other chronic pain: Secondary | ICD-10-CM

## 2016-07-17 DIAGNOSIS — N189 Chronic kidney disease, unspecified: Secondary | ICD-10-CM | POA: Diagnosis not present

## 2016-07-17 DIAGNOSIS — Z7189 Other specified counseling: Secondary | ICD-10-CM | POA: Diagnosis not present

## 2016-07-17 DIAGNOSIS — Z0000 Encounter for general adult medical examination without abnormal findings: Secondary | ICD-10-CM

## 2016-07-17 DIAGNOSIS — J61 Pneumoconiosis due to asbestos and other mineral fibers: Secondary | ICD-10-CM | POA: Diagnosis not present

## 2016-07-17 DIAGNOSIS — I5042 Chronic combined systolic (congestive) and diastolic (congestive) heart failure: Secondary | ICD-10-CM

## 2016-07-17 NOTE — Assessment & Plan Note (Signed)
Please see "Counseling regarding advanced directives and goals of care" under problem list.

## 2016-07-17 NOTE — Assessment & Plan Note (Signed)
I spent 45 minutes in advance care planning today with the patient today as outlined below.  Who would be the best person to help the doctors make decisions? [Numbers updated in the EMR.] Daughter: [name] [phone]  What matters most to you? -Family: Patient has 9 children and many grandchildren. His only daughter, [name], his grandchildren, and his god-son are the "lights of my life."  -Being able to do some of the simple household chores like cleaning out his car or changing the lightbulb.  What experiences have you had with serious illness or death? Patient personally required admission to the ICU and intubation for hypoxic respiratory distress in May 2017. He recalls waking up with a tube in his throat and feeling very frustrated in his inability to communicate with those around him. He admits to empathizing with those who are unable to speak or communicate with others after his own experience. He does thank God that he was able to make it through that experience.   He thinks about his wife who passed from lung cancer in 2003. He remembers the care she received at the end of life which focused on avoiding prolonged suffering. He is tearful today as he recalls this experience, but is grateful that she passed in peace.  [name] tells me that just last night his friend who he thought of as a brother passed away unexpectedly. He is again tearful and speaks about a recent conversation they had where they spoke about their grandchildren and the joy their young family members brought to their lives.  If you were very sick, what would be most important to you? To try treatments for a period of time, but stop if it looks likes it means giving up what matters most to you   [name] states that he would want to be resuscitated in the event he were very sick and require intubation and/or CPR/ACLS protocol. He says that he would want this only in a situation where it would be expected this would  be a temporary measure and would not be worse off than before, although he acknowledges this is difficult to predict. If it appeared he were in a situation where these measures were causing prolonged suffering, he would want to discontinue aggressive treatments.  How much flexibility should you afford your decision-maker? Complete flexibility - make decisions on my behalf even if they may be different from my own wishes  [name] has designated his daughter, [name], as his decision maker in the event he were not able to communicate his needs. He defers complete decision making to her and believes she would make decisions based on his best interests, including withdrawal of care in the event he were decompensating despite aggressive medical interventions.  Do you have anything at home to show others what we talked about to help them honor your wishes? No but have started the process  Patient provided paperwork on Advanced Directives, Living Will, and Healthcare Power of [name]. He says this is exactly what he needs and will working on completing the forms. I advised him to involve his daughter in the process.

## 2016-07-17 NOTE — Progress Notes (Signed)
   CC: Medicare Annual Wellness Exam  HPI:  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 Please see problem based charting for status of patient's chronic medical issues.   Past Medical History:  Diagnosis Date  . Alcohol abuse   . Anxiety   . Avascular necrosis of bones of both hips (HCC)    Status post bilateral hip replacements  . Bipolar disorder ([place])   . CHF (congestive heart failure) ([place])   . Chronic systolic heart failure ([place])    a. NICM - EF 35% with normal cors 2003. b. Last echo 11/2012 - EF 25-30%.  . CKD (chronic kidney disease)    Renal U/S 12/04/2009 showed no pathological findings. Labs 12/04/2009 include normal ESR, C3, C4; neg ANA; SPEP showed nonspecific increase in the alpha-2 region with no M-spike; UPEP showed no monoclonal free light chains; urine IFE showed polyclonal increase in feree Kappa and/or free Lambda light chains. Baseline Cr reported 1.7-2.5.  [name] COPD (chronic obstructive pulmonary disease) ([place])   . Depression   . DVT (deep venous thrombosis) ([place])    He has hypercoagulability with multiple prior DVTs  . E coli bacteremia   . Erectile dysfunction   . Gastritis   . GERD (gastroesophageal reflux disease)   . HCAP (healthcare-associated pneumonia) 08/24/2015  . Hydrocele, unspecified   . Hyperlipemia   . Hypertension   . 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  Thyroid scan 08/28/2009 was normal with no focal areas of abnormal increased or decreased activity seen; the uptake of I 131 sodium iodide at 24 hours was 5.7%.  TSH and free T4 normalized by 08/16/2009.  . Intestinal obstruction   .  Lung nodule    Chest CT scan on 12/14/2008 showed a nodular opacity at the left lung base felt to most likely represent scarring.  Follow-up chest CT scan on 06/02/2010 showed parenchymal scarring in the left apex, left  lower lobe, and lingula; some of this scarring at the left base had a nodular appearance, unchanged. Chest 09/2012 - stable.  . Noncompliance   . On home oxygen therapy    "3L periodically" (08/24/2015)  . Osteoarthritis   . Pleural plaque    H/o asbestos exposure. Chest CT on 06/02/2010 showed stable extensive calcified pleural plaques involving the left hemithorax, consistent with asbestos related pleural disease.  . Portal vein thrombosis   . Protein calorie malnutrition ([place]) 04/2016  . Spondylosis   . STEMI (ST elevation myocardial infarction) ([place]) 10/11/2014  . 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)    a. 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 b. St. Jude gen change 2010.  [name] Thrombocytopenia ([place])   . 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  . Weight loss    Normal colonoscopy by Dr. Olevia Perches on 11/06/2010.      Assessment & Plan:   See Encounters Tab for problem based charting.  Patient discussed with Dr. Evette Doffing

## 2016-07-18 ENCOUNTER — Other Ambulatory Visit: Payer: Self-pay | Admitting: Pharmacist

## 2016-07-18 ENCOUNTER — Other Ambulatory Visit: Payer: Self-pay | Admitting: *Deleted

## 2016-07-18 NOTE — Progress Notes (Signed)
[name] came late for his Medicare Wellness visit. I was able to assist Dr. [name] with Medicare Wellness visit by administering the TUG test, the Mini Cog and the Health Risk Assessment to [name]. I will call [name] to update his Care team section of his chart in EPIC and his advanced Care planning will have to be reschedule as needed.

## 2016-07-18 NOTE — Progress Notes (Signed)
Internal Medicine Clinic Attending  Case discussed with Dr. Patel,Vishal at the time of the visit.  We reviewed the resident's history and exam and pertinent patient test results.  I agree with the assessment, diagnosis, and plan of care documented in the resident's note.  

## 2016-07-18 NOTE — Patient Outreach (Signed)
[place] [facility]) Care Management  07/18/2016  [name] January 19, 1946 [id number]   Spoke with [place] Nurse, [name]. Unfortunately, [name] did not complete the financial assessment with [facility] Social Worker as scheduled.  PLAN:  Close pharmacy medication assistance case due to financial assessment not being completed.  Elayne Guerin, PharmD, [place] Clinical Pharmacist [phone]

## 2016-07-18 NOTE — Patient Outreach (Signed)
[place] [facility]) Care Management  07/18/2016  [name] 05-03-1946 [id number]   CSW will proceed with case closure on patient, due to patient being non-compliant with treatment plan/goals, as well as patient not being available for two scheduled home visits, as well as requesting to reschedule a home visit, within hours of the appointment time.  Patient was not available today for his scheduled home visit, nor did patient receive CSW's call.  A HIPAA compliant message was left for patient on voicemail.  CSW has contacted patient's RNCM with [place] Management, [name] to inform her of CSW's plans to close patient's case.  [name] admitted that she has also been unsuccessful in her outreach attempts to patient, having mailed an outreach letter to patient's home.  [name] plans to close patient's case if a return call is not received from patient within 10 business days of outreach letter sent.  CSW will fax a correspondence letter to patient's Primary Care Physician, [name], ensuring that they are aware of CSW's plans to remove patient from caseload.  CSW will submit a case closure request to Josepha Pigg, Care Management Assistant with [place] Management, in the form of an In [employer]. [name], BSW, MSW, LCSW  Licensed Education officer, environmental Health System  Mailing [place] [address] [place], [place] [address] Physical Address-[address], [place], [place] [address] Toll Free Main # [phone] Fax # [phone] Cell # [phone]  Office # [phone] [name]

## 2016-07-19 ENCOUNTER — Encounter: Payer: Self-pay | Admitting: Internal Medicine

## 2016-07-19 DIAGNOSIS — I4891 Unspecified atrial fibrillation: Secondary | ICD-10-CM | POA: Diagnosis not present

## 2016-07-19 DIAGNOSIS — J449 Chronic obstructive pulmonary disease, unspecified: Secondary | ICD-10-CM | POA: Diagnosis not present

## 2016-07-19 DIAGNOSIS — N19 Unspecified kidney failure: Secondary | ICD-10-CM | POA: Diagnosis not present

## 2016-07-19 DIAGNOSIS — J129 Viral pneumonia, unspecified: Secondary | ICD-10-CM | POA: Diagnosis not present

## 2016-07-24 ENCOUNTER — Encounter: Payer: Self-pay | Admitting: Internal Medicine

## 2016-07-29 ENCOUNTER — Encounter: Payer: Medicare HMO | Admitting: Internal Medicine

## 2016-07-30 ENCOUNTER — Other Ambulatory Visit: Payer: Self-pay | Admitting: *Deleted

## 2016-07-30 ENCOUNTER — Encounter: Payer: Self-pay | Admitting: *Deleted

## 2016-07-30 NOTE — Patient Outreach (Signed)
[place] [facility]) Care Management  07/30/2016  [name] 11-17-1945 [id number]   Outreach letter sent on 2/27, no response.  Will send member and primary MD case closure letters.  Will notify care management assistant of case closure.  [name], [place], MSN [place] [phone]

## 2016-07-31 ENCOUNTER — Encounter: Payer: Self-pay | Admitting: Internal Medicine

## 2016-07-31 ENCOUNTER — Ambulatory Visit (INDEPENDENT_AMBULATORY_CARE_PROVIDER_SITE_OTHER): Payer: Medicare HMO | Admitting: Internal Medicine

## 2016-07-31 VITALS — BP 100/64 | HR 89 | Ht 75.0 in | Wt 130.0 lb

## 2016-07-31 DIAGNOSIS — I4891 Unspecified atrial fibrillation: Secondary | ICD-10-CM

## 2016-07-31 DIAGNOSIS — I5042 Chronic combined systolic (congestive) and diastolic (congestive) heart failure: Secondary | ICD-10-CM | POA: Diagnosis not present

## 2016-07-31 DIAGNOSIS — I495 Sick sinus syndrome: Secondary | ICD-10-CM

## 2016-07-31 DIAGNOSIS — I428 Other cardiomyopathies: Secondary | ICD-10-CM

## 2016-07-31 LAB — CUP PACEART INCLINIC DEVICE CHECK
Battery Impedance: 1000 Ohm
Battery Voltage: 2.79 V
Date Time Interrogation Session: 20180314164557
Implantable Lead Implant Date: 20100129
Implantable Pulse Generator Implant Date: 20100129
Lead Channel Impedance Value: 460 Ohm
Lead Channel Setting Sensing Sensitivity: 2.5 mV
MDC IDC LEAD LOCATION: 753860
MDC IDC PG SERIAL: 2045834
MDC IDC SET LEADCHNL RV PACING AMPLITUDE: 2.5 V
MDC IDC SET LEADCHNL RV PACING PULSEWIDTH: 0.5 ms
MDC IDC STAT BRADY RV PERCENT PACED: 1 % — AB

## 2016-07-31 MED ORDER — METOPROLOL SUCCINATE ER 50 MG PO TB24: 150.0000 mg | ORAL_TABLET | Freq: Two times a day (BID) | ORAL | 2 refills | Status: DC

## 2016-07-31 NOTE — Progress Notes (Signed)
PCP: Zada Finders, MD  [name] is a 71 y.o. male who presents today for electrophysiology followup.  Continues to have difficulty with AF with RVR for which he is quite symptomatic.  He feels frequent tachypalpitations.  He also has failure to thrive with poor appetite and ongoing weight loss.  Today, he denies symptoms of chest pain, lower extremity edema, presyncope, or syncope.  The patient is otherwise without complaint today.   Past Medical History:  Diagnosis Date  . Alcohol abuse   . Anxiety   . Avascular necrosis of bones of both hips (HCC)    Status post bilateral hip replacements  . Bipolar disorder ([place])   . CHF (congestive heart failure) ([place])   . Chronic systolic heart failure ([place])    a. NICM - EF 35% with normal cors 2003. b. Last echo 11/2012 - EF 25-30%.  . CKD (chronic kidney disease)    Renal U/S 12/04/2009 showed no pathological findings. Labs 12/04/2009 include normal ESR, C3, C4; neg ANA; SPEP showed nonspecific increase in the alpha-2 region with no M-spike; UPEP showed no monoclonal free light chains; urine IFE showed polyclonal increase in feree Kappa and/or free Lambda light chains. Baseline Cr reported 1.7-2.5.  [name] COPD (chronic obstructive pulmonary disease) ([place])   . Depression   . DVT (deep venous thrombosis) ([place])    He has hypercoagulability with multiple prior DVTs  . E coli bacteremia   . Erectile dysfunction   . Gastritis   . GERD (gastroesophageal reflux disease)   . HCAP (healthcare-associated pneumonia) 08/24/2015  . Hydrocele, unspecified   . Hyperlipemia   . Hypertension   . 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  Thyroid scan 08/28/2009 was normal with no focal areas of abnormal increased or decreased activity seen; the uptake of I 131 sodium iodide at 24 hours was 5.7%.  TSH and free T4 normalized by 08/16/2009.  . Intestinal obstruction   . Lung nodule    Chest CT scan on 12/14/2008 showed a  nodular opacity at the left lung base felt to most likely represent scarring.  Follow-up chest CT scan on 06/02/2010 showed parenchymal scarring in the left apex, left  lower lobe, and lingula; some of this scarring at the left base had a nodular appearance, unchanged. Chest 09/2012 - stable.  . Noncompliance   . On home oxygen therapy    "3L periodically" (08/24/2015)  . Osteoarthritis   . Pleural plaque    H/o asbestos exposure. Chest CT on 06/02/2010 showed stable extensive calcified pleural plaques involving the left hemithorax, consistent with asbestos related pleural disease.  . Portal vein thrombosis   . Protein calorie malnutrition ([place]) 04/2016  . Spondylosis   . STEMI (ST elevation myocardial infarction) ([place]) 10/11/2014  . 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 b. St. Jude gen change 2010.  [name] Thrombocytopenia ([place])   . 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  . Weight loss    Normal colonoscopy by Dr. Olevia Perches on 11/06/2010.   Past Surgical History:  Procedure Laterality Date  . CARDIAC CATHETERIZATION  2003   Nl cors, EF 35%  . CARDIOVERSION N/A 05/27/2013   Procedure: CARDIOVERSION;  Surgeon: Candee Furbish, MD;  Location: [facility] ENDOSCOPY;  Service: Cardiovascular;  Laterality: N/A;  . EMBOLECTOMY Right 12/10/2013   Procedure: Right Femoral Embolectomy; Fasciotomy Right Lower  Leg with Intraoperative arteriogram;  Surgeon: Rosetta Posner, MD;  Location: [place];  Service: Vascular;  Laterality: Right;  . EMBOLECTOMY Left 10/12/2014   Procedure: Left Femoral Thrombectomy with intraoperative arteriogram;  Surgeon: Rosetta Posner, MD;  Location: [place];  Service: Vascular;  Laterality: Left;  . FASCIOTOMY CLOSURE Right 12/15/2013   Procedure: LEG FASCIOTOMY CLOSURE;  Surgeon: Rosetta Posner, MD;  Location: [place];  Service: Vascular;  Laterality: Right;  .  HEMIARTHROPLASTY HIP Left 09/09/2002   bipolar; Dr. Lafayette Dragon  . INSERT / REPLACE / REMOVE PACEMAKER  02/2003   Dr. Roe Rutherford, [employer] Medical pulse generator identity SR model [id number] serial [id number]; gen change by Greggory Brandy  . INSERT / REPLACE / REMOVE PACEMAKER  06/17/2008   Lead and generator change w/ St. Jude Medical Tendril ST model [id number] (serial  [id number]).       . INTRAOPERATIVE ARTERIOGRAM Right 12/10/2013   Procedure: INTRA OPERATIVE ARTERIOGRAM;  Surgeon: Rosetta Posner, MD;  Location: [place];  Service: Vascular;  Laterality: Right;  . JOINT REPLACEMENT    . TEE WITHOUT CARDIOVERSION N/A 05/27/2013   Procedure: TRANSESOPHAGEAL ECHOCARDIOGRAM (TEE);  Surgeon: Candee Furbish, MD;  Location: [facility] ENDOSCOPY;  Service: Cardiovascular;  Laterality: N/A;  . TOTAL HIP ARTHROPLASTY Right 03/08/2008    By Dr. Hiram Comber    ROS- all systems are reviewed and negative except as per HPI above  Current Outpatient Prescriptions  Medication Sig Dispense Refill  . albuterol (VENTOLIN HFA) 108 (90 BASE) MCG/ACT inhaler Inhale 2 puffs into the lungs every 6 (six) hours as needed for wheezing or shortness of breath. 3 Inhaler 3  . apixaban (ELIQUIS) 2.5 MG TABS tablet Take 1 tablet (2.5 mg total) by mouth 2 (two) times daily. 60 tablet 11  . aspirin 81 MG chewable tablet Chew 1 tablet (81 mg total) by mouth daily. 30 tablet 12  . buPROPion (WELLBUTRIN) 100 MG tablet Take 1 tablet (100 mg total) by mouth 3 (three) times daily. 60 tablet 3  . dronabinol (MARINOL) 5 MG capsule Take 1 capsule (5 mg total) by mouth 2 (two) times daily before lunch and supper. 60 capsule 2  . meclizine (ANTIVERT) 25 MG tablet Take 25 mg by mouth 2 (two) times daily as needed for dizziness.    . metoprolol succinate (TOPROL-XL) 100 MG 24 hr tablet Take 1 tablet (100 mg total) by mouth 2 (two) times daily. Take with or immediately following a meal. 180 tablet 3  . mirtazapine (REMERON) 15 MG tablet Take 15 mg by mouth at bedtime.     [name] omeprazole (PRILOSEC) 40 MG capsule Take 1 capsule (40 mg total) by mouth daily. 90 capsule 3  . oxyCODONE-acetaminophen (PERCOCET) 10-325 MG tablet Take 1 tablet by mouth every 4 (four) hours as needed for pain. Do not take more than 5 tablets in 24 hours. 150 tablet 0  . OXYGEN Inhale 3 L into the lungs as needed (shortness of breath).    . rosuvastatin (CRESTOR) 20 MG tablet Take 1 tablet (20 mg total) by mouth daily at 6 PM. 90 tablet 3  . tiotropium (SPIRIVA) 18 MCG inhalation capsule Place 1 capsule (18 mcg total) into inhaler and inhale daily. 90 capsule 3   No current facility-administered medications for this visit.     Physical Exam: Vitals:   07/31/16 1615  BP: 100/64  Pulse: 89  SpO2: 93%  Weight: 130 lb (59 kg)  Height: '6\' 3"'  (1.905 m)  GEN- The patient is chronically ill appearing,  alert and oriented x 3 today.   Head- normocephalic, atraumatic, bitemporal wasting Eyes-  Sclera clear, conjunctiva pink Ears- hearing intact Oropharynx- clear Lungs- clear, normal work of breathing Chest- pacemaker pocket is well healed Heart- irregular tachycardic rhythm  GI- soft, NT, ND, + BS Extremities- no clubbing, cyanosis, or edema  Pacemaker interrogation- personally reviewed in detail today,  See PACEART report  Assessment and Plan:  1. Bradycardia/ tachycardia syndrome Normal pacemaker function See Pace Art report No changes today Histograms are shifted suggesting persistent tachycardia  2. Afib/ atrial flutter Longstanding persistent He has failed multaq, tikosyn, and amiodarone Unfortunately rate control has been a challenge.  He has a rate related cardiomyopathy and is quite symptomatic.  I previously attempted an atrial flutter ablation but was unable to achieve access to his heart from the femoral vein due to venous occlusive disease.   He is felt to be a poor candidate for MAZE surgery by our surgeons.    Today I will increase toprol XL to 164m BID  I  [facility].  Perhaps transhepatic ablation of afib or atrial flutter could be considered.  An alternate approach would be AV nodal ablation from a jugular access however given recurrent infections and bacteremia I would like to avoid this if possible.    3. Chronic systolic dysfunction Likely tachycardia mediated unfortunately given that he has failed medical therapy and has venous occlusions, our options are limited (as above) Refer to Dr WOrvil Feil I will see again in 2 months   Today, I have spent 25 minutes with the patient discussing atrial fibrillation management .  More than 50% of the visit time today was spent on this issue.  JThompson GrayerMD, [facility]3/14/2018 4:32 PM

## 2016-07-31 NOTE — Patient Instructions (Signed)
Medication Instructions:  Your physician has recommended you make the following change in your medication:  1) Increase Metoprolol to 150 mg twice daily   Labwork: None ordered   Testing/Procedures: None ordered   Follow-Up: You have been referred to Dr. [facility]vil Feil at [facility] for your afib  Your physician recommends that you schedule a follow-up appointment in: 2 months with Dr Rayann Heman     Any Other Special Instructions Will Be Listed Below (If Applicable).     If you need a refill on your cardiac medications before your next appointment, please call your pharmacy.

## 2016-08-08 DIAGNOSIS — J129 Viral pneumonia, unspecified: Secondary | ICD-10-CM | POA: Diagnosis not present

## 2016-08-08 DIAGNOSIS — M6281 Muscle weakness (generalized): Secondary | ICD-10-CM | POA: Diagnosis not present

## 2016-08-08 DIAGNOSIS — I4891 Unspecified atrial fibrillation: Secondary | ICD-10-CM | POA: Diagnosis not present

## 2016-08-08 DIAGNOSIS — R2681 Unsteadiness on feet: Secondary | ICD-10-CM | POA: Diagnosis not present

## 2016-08-08 DIAGNOSIS — J441 Chronic obstructive pulmonary disease with (acute) exacerbation: Secondary | ICD-10-CM | POA: Diagnosis not present

## 2016-08-08 DIAGNOSIS — J449 Chronic obstructive pulmonary disease, unspecified: Secondary | ICD-10-CM | POA: Diagnosis not present

## 2016-08-08 DIAGNOSIS — I5022 Chronic systolic (congestive) heart failure: Secondary | ICD-10-CM | POA: Diagnosis not present

## 2016-08-08 DIAGNOSIS — N19 Unspecified kidney failure: Secondary | ICD-10-CM | POA: Diagnosis not present

## 2016-08-13 DIAGNOSIS — I4891 Unspecified atrial fibrillation: Secondary | ICD-10-CM | POA: Diagnosis not present

## 2016-08-13 DIAGNOSIS — J129 Viral pneumonia, unspecified: Secondary | ICD-10-CM | POA: Diagnosis not present

## 2016-08-13 DIAGNOSIS — M6281 Muscle weakness (generalized): Secondary | ICD-10-CM | POA: Diagnosis not present

## 2016-08-13 DIAGNOSIS — J449 Chronic obstructive pulmonary disease, unspecified: Secondary | ICD-10-CM | POA: Diagnosis not present

## 2016-08-13 DIAGNOSIS — I5022 Chronic systolic (congestive) heart failure: Secondary | ICD-10-CM | POA: Diagnosis not present

## 2016-08-13 DIAGNOSIS — R2681 Unsteadiness on feet: Secondary | ICD-10-CM | POA: Diagnosis not present

## 2016-08-13 DIAGNOSIS — N19 Unspecified kidney failure: Secondary | ICD-10-CM | POA: Diagnosis not present

## 2016-08-13 DIAGNOSIS — J441 Chronic obstructive pulmonary disease with (acute) exacerbation: Secondary | ICD-10-CM | POA: Diagnosis not present

## 2016-08-15 ENCOUNTER — Other Ambulatory Visit: Payer: Self-pay

## 2016-08-15 DIAGNOSIS — F1120 Opioid dependence, uncomplicated: Secondary | ICD-10-CM

## 2016-08-15 MED ORDER — OXYCODONE-ACETAMINOPHEN 10-325 MG PO TABS: 1.0000 | ORAL_TABLET | ORAL | 0 refills | Status: DC | PRN

## 2016-08-15 NOTE — Telephone Encounter (Signed)
Chronic pain generator is OA of spine, hip, and knee. Has a medication contract from 2017. I will refill one month supply for now. Has no follow up with PCP scheduled, please work in with Dr. [name] ASAP for future refills. Also needs urine tox at next visit.

## 2016-08-15 NOTE — Telephone Encounter (Signed)
Pt rtc, appt scheduled 4/18 w/ dr patel, cautioned that he had to be here, he agreed and will make transportation arrangements

## 2016-08-15 NOTE — Telephone Encounter (Signed)
Tried to call, no answer

## 2016-08-15 NOTE — Telephone Encounter (Signed)
oxyCODONE-acetaminophen (PERCOCET) 10-325 MG tablet, refill request. Please call pt back.

## 2016-08-18 DIAGNOSIS — R6251 Failure to thrive (child): Secondary | ICD-10-CM

## 2016-08-18 HISTORY — DX: Failure to thrive (child): R62.51

## 2016-08-19 DIAGNOSIS — I4891 Unspecified atrial fibrillation: Secondary | ICD-10-CM | POA: Diagnosis not present

## 2016-08-19 DIAGNOSIS — J449 Chronic obstructive pulmonary disease, unspecified: Secondary | ICD-10-CM | POA: Diagnosis not present

## 2016-08-19 DIAGNOSIS — N19 Unspecified kidney failure: Secondary | ICD-10-CM | POA: Diagnosis not present

## 2016-08-19 DIAGNOSIS — J129 Viral pneumonia, unspecified: Secondary | ICD-10-CM | POA: Diagnosis not present

## 2016-08-29 NOTE — Telephone Encounter (Signed)
Helen can you please close this enounter °

## 2016-09-03 ENCOUNTER — Telehealth: Payer: Self-pay | Admitting: Internal Medicine

## 2016-09-03 NOTE — Telephone Encounter (Signed)
Calling to confirm appointment for 09/04/16 at 3:45 lmtcb

## 2016-09-04 ENCOUNTER — Inpatient Hospital Stay (HOSPITAL_COMMUNITY)
Admission: AD | Admit: 2016-09-04 | Discharge: 2016-09-10 | DRG: 640 | Disposition: A | Discharge status: Home / Self Care | Payer: Medicare HMO | Source: Ambulatory Visit | Attending: Internal Medicine | Admitting: Internal Medicine

## 2016-09-04 ENCOUNTER — Other Ambulatory Visit: Payer: Self-pay

## 2016-09-04 ENCOUNTER — Ambulatory Visit (INDEPENDENT_AMBULATORY_CARE_PROVIDER_SITE_OTHER): Payer: Medicare HMO | Admitting: Internal Medicine

## 2016-09-04 ENCOUNTER — Encounter: Payer: Self-pay | Admitting: Internal Medicine

## 2016-09-04 VITALS — BP 99/73 | HR 71 | Temp 97.5°F | Wt 103.8 lb

## 2016-09-04 DIAGNOSIS — Y92009 Unspecified place in unspecified non-institutional (private) residence as the place of occurrence of the external cause: Secondary | ICD-10-CM | POA: Diagnosis not present

## 2016-09-04 DIAGNOSIS — M6281 Muscle weakness (generalized): Secondary | ICD-10-CM | POA: Diagnosis not present

## 2016-09-04 DIAGNOSIS — E43 Unspecified severe protein-calorie malnutrition: Secondary | ICD-10-CM | POA: Diagnosis present

## 2016-09-04 DIAGNOSIS — I481 Persistent atrial fibrillation: Secondary | ICD-10-CM | POA: Diagnosis present

## 2016-09-04 DIAGNOSIS — N183 Chronic kidney disease, stage 3 unspecified: Secondary | ICD-10-CM | POA: Diagnosis present

## 2016-09-04 DIAGNOSIS — R000 Tachycardia, unspecified: Secondary | ICD-10-CM | POA: Diagnosis not present

## 2016-09-04 DIAGNOSIS — R2681 Unsteadiness on feet: Secondary | ICD-10-CM | POA: Diagnosis not present

## 2016-09-04 DIAGNOSIS — I495 Sick sinus syndrome: Secondary | ICD-10-CM | POA: Diagnosis present

## 2016-09-04 DIAGNOSIS — I5042 Chronic combined systolic (congestive) and diastolic (congestive) heart failure: Secondary | ICD-10-CM

## 2016-09-04 DIAGNOSIS — T447X6A Underdosing of beta-adrenoreceptor antagonists, initial encounter: Secondary | ICD-10-CM | POA: Diagnosis present

## 2016-09-04 DIAGNOSIS — J449 Chronic obstructive pulmonary disease, unspecified: Secondary | ICD-10-CM | POA: Diagnosis not present

## 2016-09-04 DIAGNOSIS — Z91199 Patient's noncompliance with other medical treatment and regimen due to unspecified reason: Secondary | ICD-10-CM

## 2016-09-04 DIAGNOSIS — F329 Major depressive disorder, single episode, unspecified: Secondary | ICD-10-CM | POA: Diagnosis not present

## 2016-09-04 DIAGNOSIS — Z79891 Long term (current) use of opiate analgesic: Secondary | ICD-10-CM | POA: Diagnosis not present

## 2016-09-04 DIAGNOSIS — I4811 Longstanding persistent atrial fibrillation: Secondary | ICD-10-CM

## 2016-09-04 DIAGNOSIS — Z681 Body mass index (BMI) 19 or less, adult: Secondary | ICD-10-CM | POA: Diagnosis not present

## 2016-09-04 DIAGNOSIS — I959 Hypotension, unspecified: Secondary | ICD-10-CM

## 2016-09-04 DIAGNOSIS — R627 Adult failure to thrive: Secondary | ICD-10-CM

## 2016-09-04 DIAGNOSIS — Z9981 Dependence on supplemental oxygen: Secondary | ICD-10-CM

## 2016-09-04 DIAGNOSIS — D696 Thrombocytopenia, unspecified: Secondary | ICD-10-CM | POA: Diagnosis present

## 2016-09-04 DIAGNOSIS — Z86718 Personal history of other venous thrombosis and embolism: Secondary | ICD-10-CM

## 2016-09-04 DIAGNOSIS — J129 Viral pneumonia, unspecified: Secondary | ICD-10-CM | POA: Diagnosis not present

## 2016-09-04 DIAGNOSIS — J61 Pneumoconiosis due to asbestos and other mineral fibers: Secondary | ICD-10-CM | POA: Diagnosis present

## 2016-09-04 DIAGNOSIS — E785 Hyperlipidemia, unspecified: Secondary | ICD-10-CM | POA: Diagnosis present

## 2016-09-04 DIAGNOSIS — Z91128 Patient's intentional underdosing of medication regimen for other reason: Secondary | ICD-10-CM | POA: Diagnosis not present

## 2016-09-04 DIAGNOSIS — E875 Hyperkalemia: Principal | ICD-10-CM | POA: Diagnosis present

## 2016-09-04 DIAGNOSIS — K5909 Other constipation: Secondary | ICD-10-CM | POA: Diagnosis present

## 2016-09-04 DIAGNOSIS — I130 Hypertensive heart and chronic kidney disease with heart failure and stage 1 through stage 4 chronic kidney disease, or unspecified chronic kidney disease: Secondary | ICD-10-CM | POA: Diagnosis present

## 2016-09-04 DIAGNOSIS — I4891 Unspecified atrial fibrillation: Secondary | ICD-10-CM | POA: Diagnosis not present

## 2016-09-04 DIAGNOSIS — I252 Old myocardial infarction: Secondary | ICD-10-CM | POA: Diagnosis not present

## 2016-09-04 DIAGNOSIS — J438 Other emphysema: Secondary | ICD-10-CM

## 2016-09-04 DIAGNOSIS — J441 Chronic obstructive pulmonary disease with (acute) exacerbation: Secondary | ICD-10-CM | POA: Diagnosis present

## 2016-09-04 DIAGNOSIS — G8929 Other chronic pain: Secondary | ICD-10-CM | POA: Diagnosis present

## 2016-09-04 DIAGNOSIS — Z77090 Contact with and (suspected) exposure to asbestos: Secondary | ICD-10-CM | POA: Diagnosis present

## 2016-09-04 DIAGNOSIS — I700 Atherosclerosis of aorta: Secondary | ICD-10-CM | POA: Diagnosis present

## 2016-09-04 DIAGNOSIS — Z950 Presence of cardiac pacemaker: Secondary | ICD-10-CM | POA: Diagnosis not present

## 2016-09-04 DIAGNOSIS — I10 Essential (primary) hypertension: Secondary | ICD-10-CM

## 2016-09-04 DIAGNOSIS — Z7901 Long term (current) use of anticoagulants: Secondary | ICD-10-CM

## 2016-09-04 DIAGNOSIS — Z8249 Family history of ischemic heart disease and other diseases of the circulatory system: Secondary | ICD-10-CM

## 2016-09-04 DIAGNOSIS — I483 Typical atrial flutter: Secondary | ICD-10-CM | POA: Diagnosis present

## 2016-09-04 DIAGNOSIS — K219 Gastro-esophageal reflux disease without esophagitis: Secondary | ICD-10-CM | POA: Diagnosis present

## 2016-09-04 DIAGNOSIS — N19 Unspecified kidney failure: Secondary | ICD-10-CM | POA: Diagnosis not present

## 2016-09-04 DIAGNOSIS — F17210 Nicotine dependence, cigarettes, uncomplicated: Secondary | ICD-10-CM | POA: Diagnosis not present

## 2016-09-04 DIAGNOSIS — I5022 Chronic systolic (congestive) heart failure: Secondary | ICD-10-CM | POA: Diagnosis not present

## 2016-09-04 DIAGNOSIS — F339 Major depressive disorder, recurrent, unspecified: Secondary | ICD-10-CM | POA: Diagnosis present

## 2016-09-04 DIAGNOSIS — Z825 Family history of asthma and other chronic lower respiratory diseases: Secondary | ICD-10-CM

## 2016-09-04 DIAGNOSIS — R634 Abnormal weight loss: Secondary | ICD-10-CM

## 2016-09-04 DIAGNOSIS — N179 Acute kidney failure, unspecified: Secondary | ICD-10-CM | POA: Diagnosis not present

## 2016-09-04 DIAGNOSIS — Z9119 Patient's noncompliance with other medical treatment and regimen: Secondary | ICD-10-CM

## 2016-09-04 DIAGNOSIS — Z7982 Long term (current) use of aspirin: Secondary | ICD-10-CM | POA: Diagnosis not present

## 2016-09-04 DIAGNOSIS — Z79899 Other long term (current) drug therapy: Secondary | ICD-10-CM

## 2016-09-04 DIAGNOSIS — Z96643 Presence of artificial hip joint, bilateral: Secondary | ICD-10-CM | POA: Diagnosis present

## 2016-09-04 DIAGNOSIS — Z823 Family history of stroke: Secondary | ICD-10-CM

## 2016-09-04 HISTORY — DX: Failure to thrive (child): R62.51

## 2016-09-04 LAB — BASIC METABOLIC PANEL
ANION GAP: 7 (ref 5–15)
BUN: 32 mg/dL — ABNORMAL HIGH (ref 6–20)
CALCIUM: 9.9 mg/dL (ref 8.9–10.3)
CHLORIDE: 109 mmol/L (ref 101–111)
CO2: 25 mmol/L (ref 22–32)
Creatinine, Ser: 1.92 mg/dL — ABNORMAL HIGH (ref 0.61–1.24)
GFR calc non Af Amer: 33 mL/min — ABNORMAL LOW (ref >60)
GFR, EST AFRICAN AMERICAN: 39 mL/min — AB (ref >60)
Glucose, Bld: 129 mg/dL — ABNORMAL HIGH (ref 65–99)
POTASSIUM: 6.1 mmol/L — AB (ref 3.5–5.1)
Sodium: 141 mmol/L (ref 135–145)

## 2016-09-04 LAB — CBC WITH DIFFERENTIAL/PLATELET
BASOS ABS: 0 10*3/uL (ref 0.0–0.1)
Basophils Relative: 0 %
Eosinophils Absolute: 0.1 10*3/uL (ref 0.0–0.7)
Eosinophils Relative: 1 %
HCT: 47.2 % (ref 39.0–52.0)
HEMOGLOBIN: 15.8 g/dL (ref 13.0–17.0)
Lymphocytes Relative: 31 %
Lymphs Abs: 1.7 10*3/uL (ref 0.7–4.0)
MCH: 31.5 pg (ref 26.0–34.0)
MCHC: 33.5 g/dL (ref 30.0–36.0)
MCV: 94 fL (ref 78.0–100.0)
Monocytes Absolute: 0.3 10*3/uL (ref 0.1–1.0)
Monocytes Relative: 5 %
NEUTROS ABS: 3.5 10*3/uL (ref 1.7–7.7)
NEUTROS PCT: 63 %
Platelets: 148 10*3/uL — ABNORMAL LOW (ref 150–400)
RBC: 5.02 MIL/uL (ref 4.22–5.81)
RDW: 13.9 % (ref 11.5–15.5)
WBC: 5.6 10*3/uL (ref 4.0–10.5)

## 2016-09-04 LAB — MRSA PCR SCREENING: MRSA BY PCR: NEGATIVE

## 2016-09-04 MED ORDER — SENNA 8.6 MG PO TABS
2.0000 | ORAL_TABLET | Freq: Every day | ORAL | Status: DC
  Administered 2016-09-04 – 2016-09-10 (×7): 17.2 mg via ORAL
  Filled 2016-09-04 (×7): qty 2

## 2016-09-04 MED ORDER — METOPROLOL SUCCINATE ER 100 MG PO TB24
100.0000 mg | ORAL_TABLET | Freq: Every day | ORAL | Status: DC
  Administered 2016-09-04 – 2016-09-07 (×2): 100 mg via ORAL
  Filled 2016-09-04 (×4): qty 1

## 2016-09-04 MED ORDER — SENNOSIDES-DOCUSATE SODIUM 8.6-50 MG PO TABS: 1.0000 | ORAL_TABLET | Freq: Every evening | ORAL | Status: DC | PRN

## 2016-09-04 MED ORDER — POLYETHYLENE GLYCOL 3350 17 G PO PACK
17.0000 g | PACK | Freq: Every day | ORAL | Status: DC
  Administered 2016-09-04 – 2016-09-10 (×7): 17 g via ORAL
  Filled 2016-09-04 (×7): qty 1

## 2016-09-04 MED ORDER — SODIUM CHLORIDE 0.9 % IV SOLN
INTRAVENOUS | Status: AC
  Administered 2016-09-04: 23:00:00 via INTRAVENOUS

## 2016-09-04 MED ORDER — MIRTAZAPINE 15 MG PO TABS
15.0000 mg | ORAL_TABLET | Freq: Every day | ORAL | Status: DC
  Administered 2016-09-04 – 2016-09-08 (×5): 15 mg via ORAL
  Filled 2016-09-04 (×5): qty 1

## 2016-09-04 MED ORDER — ROSUVASTATIN CALCIUM 20 MG PO TABS
20.0000 mg | ORAL_TABLET | Freq: Every day | ORAL | Status: DC
  Administered 2016-09-05 – 2016-09-09 (×5): 20 mg via ORAL
  Filled 2016-09-04 (×5): qty 1

## 2016-09-04 MED ORDER — IPRATROPIUM-ALBUTEROL 0.5-2.5 (3) MG/3ML IN SOLN: 3.0000 mL | Freq: Four times a day (QID) | RESPIRATORY_TRACT | Status: DC | PRN

## 2016-09-04 MED ORDER — PANTOPRAZOLE SODIUM 40 MG PO TBEC
40.0000 mg | DELAYED_RELEASE_TABLET | Freq: Every day | ORAL | Status: DC
  Administered 2016-09-05 – 2016-09-09 (×5): 40 mg via ORAL
  Filled 2016-09-04 (×6): qty 1

## 2016-09-04 MED ORDER — OXYCODONE-ACETAMINOPHEN 5-325 MG PO TABS
1.0000 | ORAL_TABLET | ORAL | Status: DC | PRN
  Administered 2016-09-04 – 2016-09-07 (×7): 1 via ORAL
  Filled 2016-09-04 (×8): qty 1

## 2016-09-04 MED ORDER — SODIUM CHLORIDE 0.9 % IV BOLUS (SEPSIS)
1000.0000 mL | Freq: Once | INTRAVENOUS | Status: AC
  Administered 2016-09-04: 1000 mL via INTRAVENOUS

## 2016-09-04 MED ORDER — ACETAMINOPHEN 650 MG RE SUPP: 650.0000 mg | Freq: Four times a day (QID) | RECTAL | Status: DC | PRN

## 2016-09-04 MED ORDER — ACETAMINOPHEN 325 MG PO TABS
650.0000 mg | ORAL_TABLET | Freq: Four times a day (QID) | ORAL | Status: DC | PRN
  Administered 2016-09-06: 650 mg via ORAL
  Filled 2016-09-04: qty 2

## 2016-09-04 MED ORDER — OXYCODONE-ACETAMINOPHEN 10-325 MG PO TABS: 1.0000 | ORAL_TABLET | ORAL | Status: DC | PRN

## 2016-09-04 MED ORDER — SODIUM CHLORIDE 0.9% FLUSH
3.0000 mL | Freq: Two times a day (BID) | INTRAVENOUS | Status: DC
  Administered 2016-09-04 – 2016-09-10 (×10): 3 mL via INTRAVENOUS

## 2016-09-04 MED ORDER — OXYCODONE HCL 5 MG PO TABS
5.0000 mg | ORAL_TABLET | ORAL | Status: DC | PRN
  Administered 2016-09-04 – 2016-09-07 (×6): 5 mg via ORAL
  Filled 2016-09-04 (×7): qty 1

## 2016-09-04 MED ORDER — SENNA 8.6 MG PO TABS: 2.0000 | ORAL_TABLET | Freq: Every day | ORAL | Status: DC

## 2016-09-04 MED ORDER — APIXABAN 2.5 MG PO TABS
2.5000 mg | ORAL_TABLET | Freq: Two times a day (BID) | ORAL | Status: DC
  Administered 2016-09-04 – 2016-09-10 (×12): 2.5 mg via ORAL
  Filled 2016-09-04 (×12): qty 1

## 2016-09-04 MED ORDER — DRONABINOL 2.5 MG PO CAPS
5.0000 mg | ORAL_CAPSULE | Freq: Two times a day (BID) | ORAL | Status: DC
  Administered 2016-09-05 – 2016-09-10 (×11): 5 mg via ORAL
  Filled 2016-09-04 (×12): qty 2

## 2016-09-04 MED ORDER — ASPIRIN 81 MG PO CHEW
81.0000 mg | CHEWABLE_TABLET | Freq: Every day | ORAL | Status: DC
  Administered 2016-09-05 – 2016-09-10 (×6): 81 mg via ORAL
  Filled 2016-09-04 (×7): qty 1

## 2016-09-04 NOTE — Assessment & Plan Note (Signed)
Unable to address routine chronic pain issues this visit due to need for patient admission. Will address on follow up with renewal of pain contract and [employer]ox[employer].

## 2016-09-04 NOTE — Assessment & Plan Note (Addendum)
[name] has had a progressive failure to thrive picture over the last 1-2 years with 9 hospital admission since March 2017. He has poor social support and inability to care for himself. On arrival to clinic he is down 27 lbs from last month, hypotensive at 99/73, and tachycardic to 120s on my exam. He appears dehydrated and is unable to produce an adequate urine sample for his ToxAssure screen. I think he would do very poorly at home and will plan to admit him for observation for IV fluid resuscitation. I suspect he has acute on chronic kidney injury secondary to dehydration. STAT BMET has been ordered and is in process. CBC is without leukocytosis or anemia. He has no obvious sign of infection on exam or by history. Platelets are mildly low at 148,000. I discussed his case with the on call IMTS admitting team who have agreed to see patient. - Admit to observation/tele bed - Will need IVF resuscitation - Monitor volume status given history of HFrEF with daily wts & I/Os - f/u BMET - Eliquis for anticoagulation - PT/OT/Nutrition consult - Would like Palliative care to see inpatient if possible

## 2016-09-04 NOTE — Assessment & Plan Note (Signed)
Patient tachycardic today with regular rhythm on exam. He is on renal dosed Eliquis for anticoagulation. Has been following with Cardiology, [name], who has recently referred [name] to [name] at [facility] for further evaluation and management options. Suspect his current tachycardia is secondary to his dehydration.

## 2016-09-04 NOTE — H&P (Signed)
Date: 09/04/2016               Patient Name:  [name] MRN: [id number]  DOB: April 07, 1946 Age / Sex: 71 y.o., male   PCP: Zada Finders, MD         Medical Service: Internal Medicine Teaching Service         Attending Physician: Dr. Lucious Groves, DO    First Contact: Dr. Lorella Nimrod Pager: [phone]  Second Contact: Dr. Burgess Estelle Pager: [phone]       After Hours (After 5p/  First Contact Pager: [phone]  weekends / holidays): Second Contact Pager: [phone]   Chief Complaint: weight loss  History of Present Illness:  The patient is 71 year old male with a past medical history of A. fib with tachybradycardia syndrome status post pacemaker placement, chronic combined systolic and diastolic congestive heart failure, COPD, chronic pain, CKD and depression who presents as an admission from the clinic for ongoing failure to thrive and weight loss.  The patient has had a history of failure to thrive, malnutrition and depression spanning over the past year. He presented to clinic today for routine follow-up and was noted that his weight was down approximately 27 pounds. The patient says that he has no appetite at home and gets full after several bites. He may only eat 1 meal a day. He does not have dysphagia, odynophagia or other GI-related symptoms. He denies nausea or vomiting. He denies chest pain or shortness of breath. He denies fevers but does endorse chills and feeling cold. He also has a history of severe constipation. He said he had a small bowel movement today but had not had bowel movement since approximately 2 weeks prior. Lastly, he complains of stop and go urination that has been present for the previous 2 weeks. He also states that he is not taking any medications except his apixaban, Marinol and oxycodone because they make him feel "swimmy headed". He also says he feels more depressed than usual and denies SI or HI.  Laboratory evaluation in the clinic shows normal CBC.  Basic metabolic panel shows hyperkalemia with potassium of 6.1 and elevated BUN and creatinine consistent with his chronic kidney disease. He was then admitted from clinic to IMTS for further workup and management.  Meds:  Current Meds  Medication Sig  . apixaban (ELIQUIS) 2.5 MG TABS tablet Take 1 tablet (2.5 mg total) by mouth 2 (two) times daily.  [name] dronabinol (MARINOL) 5 MG capsule Take 1 capsule (5 mg total) by mouth 2 (two) times daily before lunch and supper.  [name] oxyCODONE-acetaminophen (PERCOCET) 10-325 MG tablet Take 1 tablet by mouth every 4 (four) hours as needed for pain. Do not take more than 5 tablets in 24 hours.  . OXYGEN Inhale 3 L into the lungs as needed (shortness of breath).     Allergies: Allergies as of 09/04/2016 - Review Complete 09/04/2016  Allergen Reaction Noted  . Amiodarone Other (See Comments) 07/30/2015  . Penicillins Rash    Past Medical History:  Diagnosis Date  . Alcohol abuse   . Anxiety   . Avascular necrosis of bones of both hips (HCC)    Status post bilateral hip replacements  . Bipolar disorder ([place])   . CHF (congestive heart failure) ([place])   . Chronic systolic heart failure ([place])    a. NICM - EF 35% with normal cors 2003. b. Last echo 11/2012 - EF 25-30%.  . CKD (chronic kidney disease)  Renal U/S 12/04/2009 showed no pathological findings. Labs 12/04/2009 include normal ESR, C3, C4; neg ANA; SPEP showed nonspecific increase in the alpha-2 region with no M-spike; UPEP showed no monoclonal free light chains; urine IFE showed polyclonal increase in feree Kappa and/or free Lambda light chains. Baseline Cr reported 1.7-2.5.  [name] COPD (chronic obstructive pulmonary disease) ([place])   . Depression   . DVT (deep venous thrombosis) ([place])    He has hypercoagulability with multiple prior DVTs  . E coli bacteremia   . Erectile dysfunction   . Gastritis   . GERD (gastroesophageal reflux disease)   . HCAP (healthcare-associated pneumonia) 08/24/2015  .  Hydrocele, unspecified   . Hyperlipemia   . Hypertension   . 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  Thyroid scan 08/28/2009 was normal with no focal areas of abnormal increased or decreased activity seen; the uptake of I 131 sodium iodide at 24 hours was 5.7%.  TSH and free T4 normalized by 08/16/2009.  . Intestinal obstruction ()   . Lung nodule    Chest CT scan on 12/14/2008 showed a nodular opacity at the left lung base felt to most likely represent scarring.  Follow-up chest CT scan on 06/02/2010 showed parenchymal scarring in the left apex, left  lower lobe, and lingula; some of this scarring at the left base had a nodular appearance, unchanged. Chest 09/2012 - stable.  . Noncompliance   . On home oxygen therapy    "3L periodically" (08/24/2015)  . Osteoarthritis   . Pleural plaque    H/o asbestos exposure. Chest CT on 06/02/2010 showed stable extensive calcified pleural plaques involving the left hemithorax, consistent with asbestos related pleural disease.  . Portal vein thrombosis   . Protein calorie malnutrition ([place]) 04/2016  . Spondylosis   . STEMI (ST elevation myocardial infarction) ([place]) 10/11/2014  . 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 b. St. Jude gen change 2010.  [name] Thrombocytopenia ([place])   . 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  . Weight loss    Normal colonoscopy by Dr. Olevia Perches on 11/06/2010.    Family History:  Family History  Problem Relation Age of Onset  . Hypertension Mother   . Asthma Mother   . Coronary artery disease Mother   . Cancer Mother   . Stroke Mother   . Cancer Brother     Unsure of type.  [name] Heart disease Brother   . Heart attack Brother   . Heart attack Sister   . Colon cancer Neg Hx   . Lung cancer Neg Hx   . Prostate cancer Neg Hx      Social  History:  Social History   Social History  . Marital status: Widowed    Spouse name: N/A  . Number of children: 10  . Years of education: N/A   Occupational History  . RETIRED Retired   Social History Main Topics  . Smoking status: Current Some Day Smoker    Packs/day: 0.25    Years: 54.00    Types: Cigarettes    Start date: 67  . Smokeless tobacco: Current User     Comment: Will discussed with his provider this week 2 per day  . Alcohol use No     Comment: 6/22//2016 "h/o etoh abuse; stopped drinking ~ 2012"  . Drug use: No  Comment: smokes marijuana every other day  . Sexual activity: Not Currently   Other Topics Concern  . Not on file   Social History Narrative   Retired Architect L24 yrs here in [place]   Smoker 1 pack last 1.5 days tobacco, smokes marijuana    Denies EtOH x 4 years (on 10/12/12)   9 kids    From [place]   [place] Veteran    12 grade education               Review of Systems: A complete ROS was negative except as per HPI.   Physical Exam: Blood pressure (!) 125/98, pulse (!) 142, temperature 97.5 F (36.4 C), temperature source Oral, resp. rate 18, weight (P) 52.8 kg (116 lb 4.8 oz). Physical Exam  Constitutional: He is oriented to person, place, and time.  Appears frail and cachectic  Cardiovascular:  No murmur heard. Tachycardic  Respiratory: Effort normal. He has wheezes.  Bilateral expiratory wheeze  GI: Soft. There is tenderness.  Tenderness to palpation in the midepigastric region  Musculoskeletal: He exhibits no edema.  Neurological: He is alert and oriented to person, place, and time.     EKG: Atrial flutter  CXR: Not obtained  Assessment & Plan by Problem: Principal Problem:   Failure to thrive in adult Active Problems:   Essential hypertension   COPD (chronic obstructive pulmonary disease) (HCC)   Long term current use of anticoagulant therapy   Depression   Chronic combined systolic and diastolic  congestive heart failure (HCC)   Chronic pain  The patient is 71 year old male with a complicated past medical history who presents with acute on chronic failure to thrive  # Acute on chronic failure to thrive Patient has lost 27 pounds over the prior month. He has acute on chronic failure to thrive with no clear etiology. Continues to have poor by mouth intake and early satiety. He also complains of worsening depression. The etiology of the patient's failure to thrive has not been clear from prior admissions or clinic visits. Some component may certainly be secondary to his worsening depression. Question underlying malignancy? For now, we will admit the patient and treat with IV fluids and stress nutrition as we continue to evaluate potential etiologies for his presentation. -- Normal saline 1 L bolus 1 -- Normal saline infusion at 100 mL/hr -- Encourage by mouth intake -- Marinol 5 mg twice a day -- Consider goals of care discussion and potential palliative care consult in the morning  # Acute on chronic Kidney disease  with hyperkalemia Patient has baseline chronic kidney disease. Creatinine elevated from priors at 1.92. I suspect severe dehydration based on physical examination and history. We'll treat with normal saline bolus and continuous IV fluids. Patient also hyperkalemic at 6.1. I suspect this will improve with IV hydration. Repeat BMP tomorrow. -- IV fluids -- BMP tomorrow  # A. fib with tachycardia bradycardia syndrome ## Combined heart failure The patient states he continued his anticoagulation but stopped his other cardiac medications. We'll resume his home anticoagulation and slowly titrate back his cardiac medicines as his blood pressure allows. He has had issues with hypotension in the past when resuming his home doses of medications in the setting of nonadherence. -- Apixaban 2.5 mg BID -- Metoprolol extended release 100 mg -- Aspirin 81 mg once daily -- Rosuvastatin 20 mg  once daily  # Chronic constipation -- Senokot 17.2 mg 2 tablets -- MiraLAX  # Chronic pain -- Continue  oxycodone and acetaminophen 5-325 mg every 4 hours when necessary pain  FEN/GI: Formal diet DVT/PE prophylaxis: Therapeutically anticoagulated Code: Full code  Dispo: Admit patient to Observation with expected length of stay less than 2 midnights.  Signed: Ophelia Shoulder, MD 09/04/2016, 8:59 PM  Pager: [phone]

## 2016-09-04 NOTE — Progress Notes (Signed)
CC: Weight loss  HPI:  [name] is a 71 y.o. 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  Failure to Thrive/Malnutrition/Depression: Patient with progressive failure to thrive and inability to care for himself at home. He has a diminished appetite, saying he eats 1 meal a day if he's lucky. Sometimes goes without eating a whole day. He drinks Ensure 1-2 times per day, but ran out 3 days ago. He is depressed and has no motivation to leave his home or do his daily activities. He reports lightheadedness at home with activity and near-syncopal episodes without fall/injury. He has little support at home other than occasional visits from family members. He has multiple hospital admission over the last year related to his chronic medical conditions including his heart failure and pulmonary disease as well as dehydration and kidney injury secondary to his failure to thrive and poor oral intake. He has been on Bupropion, Mirtazapine, and Dronabinol for his depression and appetite without effect. He reports not taking multiple medications recently. His weight on arrival to clinic is 103 lbs, down from 130 lbs one month ago. He has had [facility]N visit at home several times. I have referred him to palliative care and [facility]ACE in the past without much progress.  Atrial Fibrillation: Patient with chronically difficult to control Afib. Currently prescribed Toprol XL 150 mg twice daily. He has failed Amiodarone, Tikosyn, and Multaq in the past. Previously on Cardizem which was discontinued due to his low EF and fairly controlled Afib on Toprol alone. He is taking Eliquis 2.5 mg BID for anticoagulation.  Chronic Pain with long term current use of opiate  analgesic: On chronic narcotics due to chronic back, hip, and knee pain. He takes Percocet 10-325 mg, 1 tablet every 4 hours as needed for pain, #150/month. He renewed a pain contract with me in March 2017.    Past Medical History:  Diagnosis Date  . Alcohol abuse   . Anxiety   . Avascular necrosis of bones of both hips (HCC)    Status post bilateral hip replacements  . Bipolar disorder ([place])   . CHF (congestive heart failure) ([place])   . Chronic systolic heart failure ([place])    a. NICM - EF 35% with normal cors 2003. b. Last echo 11/2012 - EF 25-30%.  . CKD (chronic kidney disease)    Renal U/S 12/04/2009 showed no pathological findings. Labs 12/04/2009 include normal ESR, C3, C4; neg ANA; SPEP showed nonspecific increase in the alpha-2 region with no M-spike; UPEP showed no monoclonal free light chains; urine IFE showed polyclonal increase in feree Kappa and/or free Lambda light chains. Baseline Cr reported 1.7-2.5.  [name] COPD (chronic obstructive pulmonary disease) ([place])   . Depression   . DVT (deep venous thrombosis) ([place])    He has hypercoagulability with multiple prior DVTs  . E coli bacteremia   . Erectile dysfunction   . Gastritis   . GERD (gastroesophageal reflux disease)   . HCAP (healthcare-associated pneumonia) 08/24/2015  . Hydrocele, unspecified   . Hyperlipemia   . Hypertension   . 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  Thyroid scan 08/28/2009 was normal with no focal areas of abnormal increased or decreased activity seen; the uptake of I 131  sodium iodide at 24 hours was 5.7%.  TSH and free T4 normalized by 08/16/2009.  . Intestinal obstruction ([place])   . Lung nodule    Chest CT scan on 12/14/2008 showed a nodular opacity at the left lung base felt to most likely represent scarring.  Follow-up chest CT scan on 06/02/2010 showed parenchymal scarring in the left apex, left  lower lobe, and lingula; some of this scarring at the left base had  a nodular appearance, unchanged. Chest 09/2012 - stable.  . Noncompliance   . On home oxygen therapy    "3L periodically" (08/24/2015)  . Osteoarthritis   . Pleural plaque    H/o asbestos exposure. Chest CT on 06/02/2010 showed stable extensive calcified pleural plaques involving the left hemithorax, consistent with asbestos related pleural disease.  . Portal vein thrombosis   . Protein calorie malnutrition ([place]) 04/2016  . Spondylosis   . STEMI (ST elevation myocardial infarction) ([place]) 10/11/2014  . Tachycardia-bradycardia syndrome [facility])    a. 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 b. St[employer] Jude gen change 2010.  [name] Thrombocytopenia ([place])   . 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  . Weight loss    Normal colonoscopy by Dr. Olevia Perches on 11/06/2010.    Review of Systems:   Review of Systems  Constitutional: Positive for malaise/fatigue and weight loss. Negative for chills, diaphoresis and fever.  Respiratory: Negative for hemoptysis.        Occasional shortness of breath. Intermittent cough with clear phlegm production  Cardiovascular: Positive for palpitations. Negative for chest pain, orthopnea and leg swelling.  Gastrointestinal: Positive for constipation. Negative for abdominal pain, diarrhea, nausea and vomiting.  Genitourinary: Negative for dysuria, frequency and hematuria.  Musculoskeletal: Positive for joint pain. Negative for falls and myalgias.  Neurological: Positive for dizziness and weakness. Negative for loss of consciousness.       Near syncope  Psychiatric/Behavioral: Positive for depression.     Physical Exam:  Vitals:   09/04/16 1634  BP: 99/73  Pulse: 71  Temp: 97.5 F (36.4 C)  TempSrc: Oral  SpO2: 100%  Weight: 103 lb 12.8 oz (47.1 kg)   Physical Exam  Constitutional: He is oriented to person, place, and time.  Chronically ill-appearing man, cachectic    HENT:  Head: Normocephalic and atraumatic.  Eyes: No scleral icterus.  Cardiovascular:  Tachycardic, regular rhythm  Pulmonary/Chest: Effort normal. No respiratory distress. He has no wheezes. He has no rales.  Abdominal: Soft. He exhibits no distension. There is no tenderness. There is no guarding.  Musculoskeletal: He exhibits no edema.  Thin extremities  Neurological: He is alert and oriented to person, place, and time.  Skin: He is not diaphoretic.    Assessment & Plan:   See Encounters Tab for problem based charting.  Patient discussed with Dr. Lynnae January  Failure to thrive in adult Mr. Virts has had a progressive failure to thrive picture over the last 1-2 years with 9 hospital admission since March 2017. He has poor social support and inability to care for himself. On arrival to clinic he is down 27 lbs from last month, hypotensive at 99/73, and tachycardic to 120s on my exam. He appears dehydrated and is unable to produce an adequate urine sample for his ToxAssure screen. I think he would do very poorly at home and will plan to admit him for observation for IV fluid resuscitation. I suspect he has acute on  chronic kidney injury secondary to dehydration. STAT BMET has been ordered and is in process. CBC is without leukocytosis or anemia. He has no obvious sign of infection on exam or by history. Platelets are mildly low at 148,000. I discussed his case with the on call [facility]MT[facility] admitting team who have agreed to see patient. - Admit to observation/tele bed - Will need IVF resuscitation - Monitor volume status given history of HFrEF with daily wts & I/Os - f/u BMET - Eliquis for anticoagulation - PT/OT/Nutrition consult - Would like Palliative care to see inpatient if possible   Long term current use of opiate analgesic Unable to address routine chronic pain issues this visit due to need for patient admission. Will address on follow up with renewal of pain contract and  ToxAssure.  Longstanding persistent atrial fibrillation ([place]) Patient tachycardic today with regular rhythm on exam. He is on renal dosed Eliquis for anticoagulation. Has been following with Cardiology, Dr. Rayann Heman, who has recently referred Mr. Skare to Dr. Orvil Feil at [facility] for further evaluation and management options. Suspect his current tachycardia is secondary to his dehydration.

## 2016-09-05 ENCOUNTER — Observation Stay (HOSPITAL_COMMUNITY): Payer: Medicare HMO

## 2016-09-05 ENCOUNTER — Encounter (HOSPITAL_COMMUNITY): Payer: Self-pay | Admitting: General Practice

## 2016-09-05 DIAGNOSIS — I5042 Chronic combined systolic (congestive) and diastolic (congestive) heart failure: Secondary | ICD-10-CM

## 2016-09-05 DIAGNOSIS — R627 Adult failure to thrive: Secondary | ICD-10-CM | POA: Diagnosis not present

## 2016-09-05 DIAGNOSIS — J449 Chronic obstructive pulmonary disease, unspecified: Secondary | ICD-10-CM

## 2016-09-05 DIAGNOSIS — Z77090 Contact with and (suspected) exposure to asbestos: Secondary | ICD-10-CM

## 2016-09-05 DIAGNOSIS — N179 Acute kidney failure, unspecified: Secondary | ICD-10-CM | POA: Diagnosis not present

## 2016-09-05 DIAGNOSIS — Z823 Family history of stroke: Secondary | ICD-10-CM

## 2016-09-05 DIAGNOSIS — N183 Chronic kidney disease, stage 3 (moderate): Secondary | ICD-10-CM | POA: Diagnosis not present

## 2016-09-05 DIAGNOSIS — I700 Atherosclerosis of aorta: Secondary | ICD-10-CM | POA: Diagnosis present

## 2016-09-05 DIAGNOSIS — Z91199 Patient's noncompliance with other medical treatment and regimen due to unspecified reason: Secondary | ICD-10-CM

## 2016-09-05 DIAGNOSIS — Z79891 Long term (current) use of opiate analgesic: Secondary | ICD-10-CM

## 2016-09-05 DIAGNOSIS — Z9119 Patient's noncompliance with other medical treatment and regimen: Secondary | ICD-10-CM

## 2016-09-05 DIAGNOSIS — G8929 Other chronic pain: Secondary | ICD-10-CM | POA: Diagnosis not present

## 2016-09-05 DIAGNOSIS — Z8249 Family history of ischemic heart disease and other diseases of the circulatory system: Secondary | ICD-10-CM

## 2016-09-05 DIAGNOSIS — K5909 Other constipation: Secondary | ICD-10-CM

## 2016-09-05 DIAGNOSIS — Z801 Family history of malignant neoplasm of trachea, bronchus and lung: Secondary | ICD-10-CM

## 2016-09-05 DIAGNOSIS — I4891 Unspecified atrial fibrillation: Secondary | ICD-10-CM | POA: Diagnosis not present

## 2016-09-05 DIAGNOSIS — Z880 Allergy status to penicillin: Secondary | ICD-10-CM

## 2016-09-05 DIAGNOSIS — F17210 Nicotine dependence, cigarettes, uncomplicated: Secondary | ICD-10-CM

## 2016-09-05 DIAGNOSIS — I130 Hypertensive heart and chronic kidney disease with heart failure and stage 1 through stage 4 chronic kidney disease, or unspecified chronic kidney disease: Secondary | ICD-10-CM

## 2016-09-05 DIAGNOSIS — Z888 Allergy status to other drugs, medicaments and biological substances status: Secondary | ICD-10-CM

## 2016-09-05 DIAGNOSIS — E875 Hyperkalemia: Principal | ICD-10-CM

## 2016-09-05 DIAGNOSIS — Z8042 Family history of malignant neoplasm of prostate: Secondary | ICD-10-CM

## 2016-09-05 DIAGNOSIS — Z681 Body mass index (BMI) 19 or less, adult: Secondary | ICD-10-CM

## 2016-09-05 DIAGNOSIS — F329 Major depressive disorder, single episode, unspecified: Secondary | ICD-10-CM | POA: Diagnosis not present

## 2016-09-05 DIAGNOSIS — Z66 Do not resuscitate: Secondary | ICD-10-CM

## 2016-09-05 DIAGNOSIS — Z825 Family history of asthma and other chronic lower respiratory diseases: Secondary | ICD-10-CM

## 2016-09-05 LAB — BASIC METABOLIC PANEL
Anion gap: 8 (ref 5–15)
BUN: 30 mg/dL — AB (ref 6–20)
CALCIUM: 8.4 mg/dL — AB (ref 8.9–10.3)
CO2: 21 mmol/L — ABNORMAL LOW (ref 22–32)
Chloride: 111 mmol/L (ref 101–111)
Creatinine, Ser: 1.45 mg/dL — ABNORMAL HIGH (ref 0.61–1.24)
GFR calc Af Amer: 54 mL/min — ABNORMAL LOW (ref >60)
GFR, EST NON AFRICAN AMERICAN: 47 mL/min — AB (ref >60)
GLUCOSE: 114 mg/dL — AB (ref 65–99)
Potassium: 3.8 mmol/L (ref 3.5–5.1)
Sodium: 140 mmol/L (ref 135–145)

## 2016-09-05 MED ORDER — BOOST / RESOURCE BREEZE PO LIQD
1.0000 | Freq: Three times a day (TID) | ORAL | Status: DC
  Administered 2016-09-05 – 2016-09-10 (×17): 1 via ORAL
  Filled 2016-09-05: qty 1

## 2016-09-05 NOTE — Progress Notes (Signed)
   Subjective: Patient states that he was feeling little better as compare to yesterday. Still unable to eat anything due to early satiety and no appetite. He was denying any other complaint except generalized lethargy and do not want to get out of his house. Patient lives alone and seems like cannot take care of himself.  Objective:  Vital signs in last 24 hours: Vitals:   09/05/16 0636 09/05/16 0900 09/05/16 1100 09/05/16 1200  BP: (!) 82/53 92/66  (!) 88/61  Pulse: 90 82  96  Resp: 17     Temp: 97.5 F (36.4 C) 97.5 F (36.4 C)  97.2 F (36.2 C)  TempSrc: Oral Oral  Oral  SpO2: 100%   98%  Weight: 121 lb 12.8 oz (55.2 kg)     Height:   6\' 2"  (1.88 m)    Gen. Very cachectic and emaciated man, in no acute distress. Lungs. Clear bilaterally CV. Mild tachycardia, no murmur/gallop or rub. Abdomen. Mild right upper quadrant and epigastric tenderness, nondistended, bowel sounds positive. Extremities. No edema, no cyanosis.  Assessment/Plan:  The patient is 71 year old male with a past medical history of A. fib with tachybradycardia syndrome status post pacemaker placement, chronic combined systolic and diastolic congestive heart failure, COPD, chronic pain, CKD and depression who presents as an admission from the clinic for ongoing failure to thrive and weight loss.  Acute on chronic failure to thrive. Patient has an history of chronic failure to thrive with no clear etiology, he has an extensive workup done in the past which was negative for any malignancy. 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 Exacerbation of his depression can be a possibility-but his extensive weight loss and early satiety point out more towards GI malignancy, CT abdomen and complete PET scan done last year was negative for any suspicious lesion. We were not sure whether to work him up further more with CT chest abdomen and  pelvis. Even if we can find a cause he might not be able to tolerate the treatment. -We will repeat chest x-ray. -Might get benefit from palliative care involvement. -PT and social worker consult to find him a place at a skilled nursing facility as he cannot take care of himself. -Continue Marinol 5 mg twice daily. -Encourage by mouth intake. -Nutritional consult.  A. fib with tachycardia bradycardia syndrome. Initially he found to have atrial flutter with RVR. Patient stopped all his cardiac medications except anticoagulation himself. After restarting of his metoprolol-his rate was under control. -Continue Apixaban 2.5 mg BID -- Metoprolol extended release 100 mg -- Aspirin 81 mg once daily -- Rosuvastatin 20 mg once daily  Combined heart failure. Last echo done in March 2017 shows ejection fraction of 30%. No current sign of acute volume overload or exacerbation.  Acute on chronic Kidney disease  with hyperkalemia. His creatinine improved to his baseline with IV fluid. His hyperkalemia resolved with IV fluids too.  Chronic constipation -- Senokot 17.2 mg 2 tablets -- MiraLAX   Chronic pain -- Continue oxycodone and acetaminophen 5-325 mg every 4 hours when necessary pain  Depression. Might be a contributory factor for his acute on chronic failure to thrive condition. -Restart his home dose of Remeron 15 mg daily.   Dispo: Anticipated discharge in approximately 1-2 day(s).   Lorella Nimrod, MD 09/05/2016, 3:59 PM Pager: [phone]

## 2016-09-05 NOTE — Evaluation (Signed)
Occupational Therapy Evaluation and Discharge Patient Details Name: [name] MRN: [id number] DOB: [birth year] Today's Date: 09/05/2016    History of Present Illness Pt is a 71 yo male admitted through ED for failure to thrive with a weight loss of 23lbs in a month. Pt has no appetite and tries to eat but becomes full after 2-3 bites. PMH significant for bilateral hip replacements, HTN, COPD, Depression, CHF, Chornic back pain, pacemaker.    Clinical Impression   Pt is functioning at a supervision to modified independent level in ADL. All equipment needs met. No further OT needs.    Follow Up Recommendations  No OT follow up    Equipment Recommendations  None recommended by OT    Recommendations for Other Services       Precautions / Restrictions Precautions Precautions: Fall Restrictions Weight Bearing Restrictions: No      Mobility Bed Mobility Overal bed mobility: Modified Independent             General bed mobility comments: able to get EOB with bed flat and no assistance  Transfers Overall transfer level: Needs assistance Equipment used: Rolling walker (2 wheeled) Transfers: Sit to/from Stand Sit to Stand: Supervision         General transfer comment: supervision for safety from EOB to RW.     Balance Overall balance assessment: Needs assistance Sitting-balance support: No upper extremity supported;Feet supported Sitting balance-Leahy Scale: Good     Standing balance support: No upper extremity supported;Single extremity supported Standing balance-Leahy Scale: Fair                             ADL either performed or assessed with clinical judgement   ADL Overall ADL's :  (Supervision for safety.)                                             Vision Baseline Vision/History: Wears glasses Wears Glasses: At all times Patient Visual Report: No change from baseline       Perception     Praxis      Pertinent  Vitals/Pain Pain Assessment: Faces Faces Pain Scale: Hurts even more Pain Location: L heel Pain Descriptors / Indicators: Sore Pain Intervention(s): Monitored during session;Repositioned     Hand Dominance Right   Extremity/Trunk Assessment Upper Extremity Assessment Upper Extremity Assessment: Overall WFL for tasks assessed   Lower Extremity Assessment Lower Extremity Assessment: Defer to PT evaluation   Cervical / Trunk Assessment Cervical / Trunk Assessment: Normal   Communication Communication Communication: HOH   Cognition Arousal/Alertness: Awake/alert Behavior During Therapy: WFL for tasks assessed/performed Overall Cognitive Status: Within Functional Limits for tasks assessed                                     General Comments       Exercises     Shoulder Instructions      Home Living Family/patient expects to be discharged to:: Private residence Living Arrangements: Children;Other (Comment) (49 YO son stayes with him 3 days a week and on weekends. ) Available Help at Discharge: Family;Friend(s);Available PRN/intermittently Type of Home: Apartment Home Access: Level entry;Elevator     Home Layout: One level     Bathroom Shower/Tub: Walk-in shower  Bathroom Toilet: Standard     Home Equipment: Environmental consultant - 4 wheels;Walker - 2 wheels;Cane - single point;Bedside commode;Wheelchair - Education administrator (comment);Cane - quad;Shower seat;Grab bars - toilet;Grab bars - tub/shower          Prior Functioning/Environment Level of Independence: Independent with assistive device(s)        Comments: Uses WC to get around apartment, Rollator for mobility to [facility] and  out to car. Cane when he goes to the store.         OT Problem List: Impaired balance (sitting and/or standing)      OT Treatment/Interventions:      OT Goals(Current goals can be found in the care plan section) Acute Rehab OT Goals Patient Stated Goal: to find out why he keeps  losing weight  OT Frequency:     Barriers to D/C:            Co-evaluation PT/OT/SLP Co-Evaluation/Treatment: Yes Reason for Co-Treatment: For patient/therapist safety PT goals addressed during session: Mobility/safety with mobility OT goals addressed during session: ADL's and self-care      End of Session Equipment Utilized During Treatment: Rolling walker  Activity Tolerance: Patient tolerated treatment well Patient left: in chair;with call bell/phone within reach;with chair alarm set  OT Visit Diagnosis: Pain;Unsteadiness on feet (R26.81) Pain - Right/Left: Left Pain - part of body: Ankle and joints of foot                Time: 1546-1600 OT Time Calculation (min): 14 min Charges:  OT General Charges $OT Visit: 1 Procedure OT Evaluation $OT Eval Low Complexity: 1 Procedure G-Codes: OT G-codes **NOT FOR INPATIENT CLASS** Functional Assessment Tool Used: Clinical judgement Functional Limitation: Self care Self Care Current Status (I3474): At least 1 percent but less than 20 percent impaired, limited or restricted Self Care Goal Status (Q5956): At least 1 percent but less than 20 percent impaired, limited or restricted Self Care Discharge Status [phone]): At least 1 percent but less than 20 percent impaired, limited or restricted     Malka So 09/05/2016, 4:37 PM  [phone]

## 2016-09-05 NOTE — H&P (Signed)
Internal Medicine Attending Admission Note  I saw and evaluated the patient. I reviewed the resident's note and I agree with the resident's findings and plan as documented in the resident's note.  Assessment & Plan by Problem:   Weight loss, decreased appetite, Failure to thrive in adult - We certainly have a broad differential for the cause of his weight loss.  In general this would include weight loss from malignancy, psychiatric disorders, endocrine disorders, infectious process, non-malignant GI disease, and neurologic disease, medications, and advanced chronic diseases.  [name] has been admitted recently a few months ago and workup was negative for a cause,  [name] has continued to loose weight despite appetite stimulants. - Overall [name] has a very poor functional status and multiple chronic medical illness (CHF, COPD, major depression) which alone may be an explanation for failure to thrive.  Additionally within the last year [name] has had a non contrast CT of the abd and pelvis as well as CT of the chest within the past year, this have not revealed a clear cause of his failure to thrive.  In the absence of other symptoms I do not feel it would be beneficial to blidly look for other causes, additionally if we were to find an advanced malignancy which would cause explain this significant weight loss his poor functional status and co morbidities would preclude treatment.  Therefore I have discussed with him that we only focus on potentially reversible causes of his weight loss.  [name] is actually very agreeable to this, over the last several months [name] has been more introspective to his life, [name] does report that [name] belives in GOD and while we would like pursue treatments that potentially could extend his life [name] is starting to focus more on quality.  I did discuss code status and explained DNR status, [name] is now agreeable to DNR status.  [name] does have an advanced directive form but [name] has yet to fill out or  notarize. We will obtain a CXR given his history of Asbestosis exposure and pleural effusions but I doubt this is the cause. -Will obtain TSH and AM cortisol/ACTH to evaluate for reversible endocrine causes  -Will screen for HIV for infectious cause  ([name] has a recent negative Hep C, will not repeat this)  -We will request PT and OT evaluations as well as social work  -Building control surveyor, mirtazapine for appetitive  -We will carefully review his medication list for potential drugs that could be discontinued, this will be difficult as [name] does have multiple co-morbidities.    Essential hypertension - BP low, hold medications    COPD (chronic obstructive pulmonary disease) (HCC) - Monitor    Long term current use of anticoagulant therapy - Continue Eliquis 2.5mg  BID    Major Depressive Disorder - Continue mirtazapine    Chronic combined systolic and diastolic congestive heart failure (HCC) - Monitor volume status    Chronic pain - Continue oxycodone for now.  Chief Complaint(s): weight loss  History - key components related to admission: Briefly [name] is a 71 yo male with a PMH of Afib/A flutter with tachybradycardia syndrome s/p ppm (has failed multiple treatments for control, chronic combined CHF (likely tarchycardia induced), COPD, Pleural plaques with history of asbestosis exposure, CKD stage 3, and MDD who presented to his PCP Dr [name] at the [facility] yesterday for routine follow up.  [name] was noted to have ongoing weight loss despite appetite stimulatues of marrinol and mirtazapine as well as nutritional  supplements.  His weight loss has been ongoing for about a year, workup has not been revealing.  Additionally [name] lives alone, [name] has previously relied on his "God son" as well as nephew for assistance as well as a neighbor who occasionally cooks food.  [name] does report that [name] is able to warm up some meals and feed himself however [name] notes that [name] has had a recent disagreement with his  "god son" about money and they are no longer speaking.  [name] reports to me that [name] typically only sees another person once or twice a week.  [name] usually may eat only one meal.  [name] reports this is due to lack of appetite.  [name] denies any abdominal pain with eating.  Denies any vomiting.  [name] does have chronic constipation.  [name] denies SOB, fever, chills, night sweats, hemoptysis, melena.  [name] has recently stopped all of his medications toher than apixaban, marinol, and oxycodone because [name] feels they make him light headed.  [name] does feel more depressed lately.  [name] does acknowledge chronic fatigue.  Lab results: Reviewed in Epic EKG: A flutter with ventricular rate of 142   Physical Exam - key components related to admission: General: cachetic male resting in bed HEENT: EOMI, no scleral icterus, temporal wasting Cardiac: tachycardic appears regular, no rubs, murmurs or gallops Pulm: CTAB mild crackles left base Chest: entire rib cage visable Abd: soft, mild epigastric tenderness, nondistended, BS present Ext: some wasting of intrestic muscle of hand, warm and well perfused, no pedal edema Neuro: alert and oriented X3  Vitals:   09/05/16 0636 09/05/16 0900 09/05/16 1100 09/05/16 1200  BP: (!) 82/53 92/66  (!) 88/61  Pulse: 90 82  96  Resp: 17     Temp: 97.5 F (36.4 C) 97.5 F (36.4 C)  97.2 F (36.2 C)  TempSrc: Oral Oral  Oral  SpO2: 100%   98%  Weight: 121 lb 12.8 oz (55.2 kg)     Height:   6\' 2"  (1.88 m)

## 2016-09-05 NOTE — Evaluation (Signed)
Physical Therapy Evaluation Patient Details Name: [name] MRN: [id number] DOB: 22-May-1945 Today's Date: 09/05/2016   History of Present Illness  Pt is a 71 yo male admitted through ED for failure to thrive with a weight loss of 23lbs in a month. Pt has no appetite and tries to eat but becomes full after 2-3 bites. PMH significant for bilateral hip replacements, HTN, COPD, Depression, CHF, Chornic back pain, pacemaker.   Clinical Impression  Pt presents with the above diagnosis and below deficits for therapy evaluation. Prior to admission, pt was living with his 91 year old son and doing for himself including WC mobility throughout home and using a cane or rollator when he leaves home. Pt was driving himself to MD appointments and to the grocery store. Pt requires min guard assist for mobility this session and will benefit from continued acute services in order to progress to Mod I prior to discharge. Pt will not require any additional therapy follow-up at discharge.     Follow Up Recommendations No PT follow up    Equipment Recommendations  None recommended by PT    Recommendations for Other Services       Precautions / Restrictions Precautions Precautions: Fall Restrictions Weight Bearing Restrictions: No      Mobility  Bed Mobility Overal bed mobility: Modified Independent             General bed mobility comments: able to get EOB with bed flat and no assistance  Transfers Overall transfer level: Needs assistance Equipment used: Rolling walker (2 wheeled) Transfers: Sit to/from Stand Sit to Stand: Supervision         General transfer comment: supervision for safety from EOB to RW.   Ambulation/Gait Ambulation/Gait assistance: Min guard Ambulation Distance (Feet): 120 Feet Assistive device: Rolling walker (2 wheeled) Gait Pattern/deviations: Step-through pattern;Decreased step length - right;Decreased step length - left Gait velocity: decreasesd Gait  velocity interpretation: Below normal speed for age/gender General Gait Details: toe touch weight bearing on LLE due to sore on his left heel which is painful with weight bearing. Decreased cadence, but good sequencing with RW  Stairs            Wheelchair Mobility    Modified Rankin (Stroke Patients Only)       Balance Overall balance assessment: Needs assistance Sitting-balance support: No upper extremity supported;Feet supported Sitting balance-Leahy Scale: Good     Standing balance support: No upper extremity supported;Single extremity supported Standing balance-Leahy Scale: Fair                               Pertinent Vitals/Pain Pain Assessment: No/denies pain    Home Living Family/patient expects to be discharged to:: Private residence Living Arrangements: Children;Other (Comment) (32 YO son stayes with him 3 days a week and on weekends. ) Available Help at Discharge: Family;Friend(s);Available PRN/intermittently Type of Home: Apartment Home Access: Level entry;Elevator     Home Layout: One level Home Equipment: Walker - 4 wheels;Walker - 2 wheels;Cane - single point;Bedside commode;Wheelchair - Education administrator (comment);Cane - quad;Shower seat      Prior Function Level of Independence: Independent with assistive device(s)         Comments: Uses WC to get around apartment, Rollator for mobility to [facility] and  out to car. Cane when he goes to the store.      Hand Dominance   Dominant Hand: Right    Extremity/Trunk Assessment  Upper Extremity Assessment Upper Extremity Assessment: Defer to OT evaluation    Lower Extremity Assessment Lower Extremity Assessment: Generalized weakness    Cervical / Trunk Assessment Cervical / Trunk Assessment: Normal  Communication   Communication: HOH  Cognition Arousal/Alertness: Awake/alert Behavior During Therapy: WFL for tasks assessed/performed Overall Cognitive Status: Within Functional Limits for  tasks assessed                                        General Comments      Exercises     Assessment/Plan    PT Assessment Patient needs continued PT services  PT Problem List Decreased strength;Decreased activity tolerance;Decreased balance;Decreased mobility       PT Treatment Interventions Gait training;Functional mobility training;Therapeutic activities;Therapeutic exercise;Balance training    PT Goals (Current goals can be found in the Care Plan section)  Acute Rehab PT Goals Patient Stated Goal: to find out why he keeps losing weight PT Goal Formulation: With patient Time For Goal Achievement: 09/12/16 Potential to Achieve Goals: Good    Frequency Min 3X/week   Barriers to discharge        Co-evaluation PT/OT/SLP Co-Evaluation/Treatment: Yes Reason for Co-Treatment: For patient/therapist safety PT goals addressed during session: Mobility/safety with mobility         End of Session Equipment Utilized During Treatment: Gait belt Activity Tolerance: Patient tolerated treatment well Patient left: in chair;with call bell/phone within reach;with chair alarm set Nurse Communication: Mobility status PT Visit Diagnosis: Difficulty in walking, not elsewhere classified (R26.2);Muscle weakness (generalized) (M62.81)    Time: 3875-6433 PT Time Calculation (min) (ACUTE ONLY): 14 min   Charges:   PT Evaluation $PT Eval Low Complexity: 1 Procedure     PT G Codes:   PT G-Codes **NOT FOR INPATIENT CLASS** Functional Assessment Tool Used: AM-PAC 6 Clicks Basic Mobility;Clinical judgement Functional Limitation: Mobility: Walking and moving around Mobility: Walking and Moving Around Current Status (I9518): At least 20 percent but less than 40 percent impaired, limited or restricted Mobility: Walking and Moving Around Goal Status [phone]): At least 1 percent but less than 20 percent impaired, limited or restricted    Scheryl Marten PT, DPT   [phone]   [name] 09/05/2016, 4:31 PM

## 2016-09-05 NOTE — Progress Notes (Signed)
Internal Medicine Clinic Attending  Case discussed with Dr. V Patel at the time of the visit.  We reviewed the resident's history and exam and pertinent patient test results.  I agree with the assessment, diagnosis, and plan of care documented in the resident's note.  

## 2016-09-05 NOTE — Progress Notes (Addendum)
Initial Nutrition Assessment  DOCUMENTATION CODES:   Underweight  INTERVENTION:   Provide Mighty Shake II once daily with meals, each supplement provides 480-500 kcals and 20-23 grams of protein Provide Magic cup BID with meals, each supplement provides 290 kcal and 9 grams of protein Boost Breeze po TID between meals, each supplement provides 250 kcal and 9 grams of protein  NUTRITION DIAGNOSIS:   Underweight related to poor appetite as evidenced by other (see comment) (BMI less than 18.5).   GOAL:   Patient will meet greater than or equal to 90% of their needs   MONITOR:   PO intake, Supplement acceptance, Labs, Skin, Weight trends  REASON FOR ASSESSMENT:   Consult Assessment of nutrition requirement/status  ASSESSMENT:   71 year old male with a past medical history of A. fib with tachybradycardia syndrome status post pacemaker placement, chronic combined systolic and diastolic congestive heart failure, COPD, chronic pain, CKD and depression who presents as an admission from the clinic for ongoing failure to thrive and weight loss.  Pt familiar to nutrition team from previous admissions. Pt has met criteria for severe malnutrition in the past. Pt asleep at time of visit; unable to arouse. Per nursing notes, pt ate 30% of breakfast this morning and drank 4 Boost Breeze supplements overnight. During admission in December 2017 pt reported poor appetite, limited ability to prepare food, and reported drinking Boost and Ensure supplements at home. Pt has lost an additional 9 lbs in the past month. Unable to perform nutrition-focused physical exam at this time. Expect pt would meet criteria for severe malnutrition.  Per chart, pt on Marinol PTA; currently on Marinol and Remeron.  Labs: low calcium  Diet Order:  Diet regular Room service appropriate? Yes; Fluid consistency: Thin  Skin:  Reviewed, no issues  Last BM:  4/18  Height:   Ht Readings from Last 1 Encounters:   09/05/16 _0  (1.88 m)    Weight:   Wt Readings from Last 1 Encounters:  09/05/16 121 lb 12.8 oz (55.2 kg)    Ideal Body Weight:  89.09 kg  BMI:  Body mass index is 15.64 kg/m.  Estimated Nutritional Needs:   Kcal:  1900-2100  Protein:  80-90 grams  Fluid:  1.9 L/day  EDUCATION NEEDS:   No education needs identified at this time  Scarlette Ar RD, LDN, CSP Inpatient Clinical Dietitian Pager: [phone] After Hours Pager: [phone]

## 2016-09-06 DIAGNOSIS — E875 Hyperkalemia: Secondary | ICD-10-CM | POA: Diagnosis not present

## 2016-09-06 DIAGNOSIS — J449 Chronic obstructive pulmonary disease, unspecified: Secondary | ICD-10-CM | POA: Diagnosis not present

## 2016-09-06 DIAGNOSIS — I5042 Chronic combined systolic (congestive) and diastolic (congestive) heart failure: Secondary | ICD-10-CM | POA: Diagnosis not present

## 2016-09-06 DIAGNOSIS — G8929 Other chronic pain: Secondary | ICD-10-CM | POA: Diagnosis not present

## 2016-09-06 DIAGNOSIS — R627 Adult failure to thrive: Secondary | ICD-10-CM | POA: Diagnosis not present

## 2016-09-06 DIAGNOSIS — Z7901 Long term (current) use of anticoagulants: Secondary | ICD-10-CM | POA: Diagnosis not present

## 2016-09-06 DIAGNOSIS — F329 Major depressive disorder, single episode, unspecified: Secondary | ICD-10-CM | POA: Diagnosis not present

## 2016-09-06 DIAGNOSIS — I4891 Unspecified atrial fibrillation: Secondary | ICD-10-CM | POA: Diagnosis not present

## 2016-09-06 DIAGNOSIS — K5909 Other constipation: Secondary | ICD-10-CM | POA: Diagnosis not present

## 2016-09-06 DIAGNOSIS — I130 Hypertensive heart and chronic kidney disease with heart failure and stage 1 through stage 4 chronic kidney disease, or unspecified chronic kidney disease: Secondary | ICD-10-CM | POA: Diagnosis not present

## 2016-09-06 DIAGNOSIS — N183 Chronic kidney disease, stage 3 (moderate): Secondary | ICD-10-CM | POA: Diagnosis not present

## 2016-09-06 DIAGNOSIS — N179 Acute kidney failure, unspecified: Secondary | ICD-10-CM | POA: Diagnosis not present

## 2016-09-06 LAB — TSH: TSH: 4.493 u[IU]/mL (ref 0.350–4.500)

## 2016-09-06 LAB — BASIC METABOLIC PANEL
Anion gap: 10 (ref 5–15)
BUN: 28 mg/dL — AB (ref 6–20)
CHLORIDE: 112 mmol/L — AB (ref 101–111)
CO2: 17 mmol/L — ABNORMAL LOW (ref 22–32)
Calcium: 8.6 mg/dL — ABNORMAL LOW (ref 8.9–10.3)
Creatinine, Ser: 1.38 mg/dL — ABNORMAL HIGH (ref 0.61–1.24)
GFR calc Af Amer: 57 mL/min — ABNORMAL LOW (ref >60)
GFR calc non Af Amer: 50 mL/min — ABNORMAL LOW (ref >60)
Glucose, Bld: 125 mg/dL — ABNORMAL HIGH (ref 65–99)
POTASSIUM: 4.4 mmol/L (ref 3.5–5.1)
SODIUM: 139 mmol/L (ref 135–145)

## 2016-09-06 LAB — CBC
HEMATOCRIT: 43.3 % (ref 39.0–52.0)
Hemoglobin: 14.3 g/dL (ref 13.0–17.0)
MCH: 31.8 pg (ref 26.0–34.0)
MCHC: 33 g/dL (ref 30.0–36.0)
MCV: 96.4 fL (ref 78.0–100.0)
Platelets: 107 10*3/uL — ABNORMAL LOW (ref 150–400)
RBC: 4.49 MIL/uL (ref 4.22–5.81)
RDW: 14.5 % (ref 11.5–15.5)
WBC: 6.2 10*3/uL (ref 4.0–10.5)

## 2016-09-06 LAB — HIV ANTIBODY (ROUTINE TESTING W REFLEX): HIV SCREEN 4TH GENERATION: NONREACTIVE

## 2016-09-06 LAB — CORTISOL: Cortisol, Plasma: 4.2 ug/dL

## 2016-09-06 NOTE — Progress Notes (Signed)
Lab reports that the ACTH for this AM can only be drawn from 7am-10am due to issues with [facility]. Other AM labs will be drawn at their normal time. Will continue to monitor.

## 2016-09-06 NOTE — Progress Notes (Signed)
Pt refuses phlebotomy drawing labs this AM until after 6:30am. Lab has informed this RN that they will be back at 8am to draw all AM labs at one time. Will continue to monitor.

## 2016-09-06 NOTE — Progress Notes (Signed)
Advance Beneficiary Notice of Noncoverage (ABN) given to the patient as requested by Rolene Arbour UR CM Reviewer and Dr Burnard Bunting Medical Director; letter read to patient, all questions answered. Mindi Slicker [facility] [phone]

## 2016-09-06 NOTE — Progress Notes (Signed)
Internal Medicine Attending:   I saw and examined the patient. I reviewed the resident's note and I agree with the resident's findings and plan as documented in the resident's note. Patient is feeling better this morning, slightly improved appetite.  He was evaluated by PT and OT and he is apparently more function that I had expected, it appears he may be able to return home after this hospitalization.  We did pursue a limited workup for his failure to thrive and it appears that his AM cortisol is 4.2 which is strongly suggestive of adrenal insufficiency as a potential cause of his weight loss.   We have a ACTH pending, we will need to pursue a cosyntropin stim test.  If ACTH returns elevated would recommend the standard dose cosyntropin stim test if normal or low would order the low dose cosyntropin stim test.  Otherwise his CXR has returned unchanged and thus reassuring that he does not have a worsening pulmonary cause of his weight loss.  Additionally TSH is wnl and HIV negative.

## 2016-09-06 NOTE — Progress Notes (Signed)
Pt's pressures were soft this am, his HR was minimally tachy, though he is in atrial flutter. Pts metoprolol held due to soft pressures. Will continue to monitor.

## 2016-09-06 NOTE — Progress Notes (Signed)
Transitions of Care Pharmacy Note  Plan:  Educated on likely start of new prescription hydrocortisone for adrenal insufficiency Reeducated pt on apixaban --------------------------------------------- [name] is an 71 y.o. male who presents with a chief complaint FTT. In anticipation of discharge, pharmacy has reviewed this patient's prior to admission medication history, as well as current inpatient medications listed per the [facility].  Current medication indications, dosing, frequency, and notable side effects reviewed with patient. patient verbalized understanding of current inpatient medication regimen and is aware that the After Visit Summary when presented, will represent the most accurate medication list at discharge.   Assessment: Understanding of regimen: good Understanding of indications: good Potential of compliance: good Barriers to Obtaining Medications: No  Patient instructed to contact inpatient pharmacy team with further questions or concerns if needed.    Time spent preparing for discharge counseling: 15 Time spent counseling patient: 10   Thank you for allowing pharmacy to be a part of this patient's care.  Arrie Senate, PharmD PGY-1 Pharmacy Resident Pager: [phone] 09/06/2016

## 2016-09-06 NOTE — Care Management Obs Status (Signed)
MEDICARE OBSERVATION STATUS NOTIFICATION   Patient Details  Name: [name] MRN: [id number] Date of Birth: April 04, 1946   Medicare Observation Status Notification Given:  Yes    Royston Bake, RN 09/06/2016, 2:41 PM

## 2016-09-06 NOTE — Progress Notes (Signed)
Pt's BP 90/62, HR  139 bpm. Pt is resting, asymptomatic.BB was held on day shift due to low blood pressure. Pt is comfortable. MD on call Lovena Le made aware. Will continue to monitor.  Lenora Gomes, RN

## 2016-09-06 NOTE — Progress Notes (Signed)
Subjective:  Pt. was feeling much better this morning, stating his appetite is little better and he was able to eat his dinner last night. He was reluctant to go to a skilled nursing facility, as he wants to live independent but wants some help with preparing meals for him.  Objective:  Vital signs in last 24 hours: Vitals:   09/05/16 2045 09/06/16 0514 09/06/16 0810 09/06/16 0931  BP:  107/67 (!) 84/67 (!) 89/63  Pulse:  100 (!) 132 (!) 103  Resp: 18 16 18    Temp: 97.5 F (36.4 C) 97.8 F (36.6 C) 97.5 F (36.4 C)   TempSrc: Oral Oral Oral   SpO2: 96% 100% 100%   Weight:  122 lb 3.2 oz (55.4 kg)    Height:       Gen. Very cachectic and emaciated man, in no acute distress. Appears more comfortable and satisfied today. Lungs. Clear bilaterally CV. Mild tachycardia, no murmur/gallop or rub. Abdomen. Mild right upper quadrant and epigastric tenderness, nondistended, bowel sounds positive. Extremities. No edema, no cyanosis.  Labs. CBC Latest Ref Rng & Units 09/06/2016 09/04/2016 04/30/2016  WBC 4.0 - 10.5 K/uL 6.2 5.6 5.6  Hemoglobin 13.0 - 17.0 g/dL 14.3 15.8 13.1  Hematocrit 39.0 - 52.0 % 43.3 47.2 41.0  Platelets 150 - 400 K/uL 107(L) 148(L) 109(L)   BMP Latest Ref Rng & Units 09/06/2016 09/05/2016 09/04/2016  Glucose 65 - 99 mg/dL 125(H) 114(H) 129(H)  BUN 6 - 20 mg/dL 28(H) 30(H) 32(H)  Creatinine 0.61 - 1.24 mg/dL 1.38(H) 1.45(H) 1.92(H)  BUN/Creat Ratio 10 - 22 - - -  Sodium 135 - 145 mmol/L 139 140 141  Potassium 3.5 - 5.1 mmol/L 4.4 3.8 6.1(H)  Chloride 101 - 111 mmol/L 112(H) 111 109  CO2 22 - 32 mmol/L 17(L) 21(L) 25  Calcium 8.9 - 10.3 mg/dL 8.6(L) 8.4(L) 9.9   TSH. 4.493 AM cortisol. 4.2  DG Chest. FINDINGS: Stable chronic pleural thickening and calcified pleural plaques, greater on the left. These again simulate a left lateral pneumothorax. Stable right nipple shadow. Grossly normal sized heart. Calcified thoracic aorta. Stable right subclavian pacemaker  leads. Mild scoliosis and diffuse osteopenia.  IMPRESSION: No acute abnormality.  Stable chronic changes, as described above.  Assessment/Plan:  The patient is 71 year old male with a past medical history of A. fib with tachybradycardia syndrome status post pacemaker placement, chronic combined systolic and diastolic congestive heart failure, COPD, chronic pain, CKD and depression who presents as an admission from the clinic for ongoing failure to thrive and weight loss.  Acute on chronic failure to thrive. His AM cortisol levels are low-most likely the cause of his failure to thrive.ACTH levels are still pending. He also wants some help to prepare his meals. PT and OT are not recommending SNF placement and pt. Also is not interested as wants to stay independent. -Talked with SW to find some recourses  so he can get needed help and can stay independent. -We will start him on hydrocortisone after getting ACTH results, might need to do ACTH stimulation test if needed. -Continue Marinol 5 mg twice daily.  A. fib with tachycardia bradycardia syndrome. Still mildly tachycardic, remained asymptomatic. -Continue Apixaban 2.5 mg BID - Metoprolol extended release 100 mg - Aspirin 81 mg once daily - Rosuvastatin 20 mg once daily  Combined heart failure.Last echo done in March 2017 shows ejection fraction of 30%. No current sign of acute volume overload or exacerbation.  Acute on chronic Kidney diseasewith hyperkalemia. Resolved.  His creatinine improved to his baseline with IV fluid.  Chronic constipation. Had a small BM yesterday. - Senokot 17.2 mg 2 tablets - MiraLAX   Chronic pain. No current c/o any exacerbation. - Continue oxycodone and acetaminophen 5-325 mgevery 4 hours when necessary pain.  Depression. Might be a contributory factor for his acute on chronic failure to thrive condition. -Continue his home dose of Remeron 15 mg daily.  Dispo: Anticipated discharge in  approximately 1 day(s).   Lorella Nimrod, MD 09/06/2016, 12:09 PM Pager: [phone]

## 2016-09-06 NOTE — Progress Notes (Signed)
Page to [phone]. Per MD returning call, continue to hold beta blockers due to the soft pressures and increasing tachycardia. Recent checks, 89/63(69) followed by 86/60(64).  Pt requested oxycodone, pain medication held due to soft pressures.  No further interventions advised at this time; it is possible orders may come through later for the patient's tachycardia.

## 2016-09-06 NOTE — Clinical Social Work Note (Signed)
CSW acknowledges SNF consult. PT recommending no PT follow up.  CSW signing off. Consult again if any other social work needs arise.  Dayton Scrape, [place]

## 2016-09-07 DIAGNOSIS — K5909 Other constipation: Secondary | ICD-10-CM | POA: Diagnosis present

## 2016-09-07 DIAGNOSIS — Z7901 Long term (current) use of anticoagulants: Secondary | ICD-10-CM

## 2016-09-07 DIAGNOSIS — Z79899 Other long term (current) drug therapy: Secondary | ICD-10-CM | POA: Diagnosis not present

## 2016-09-07 DIAGNOSIS — I700 Atherosclerosis of aorta: Secondary | ICD-10-CM | POA: Diagnosis present

## 2016-09-07 DIAGNOSIS — I483 Typical atrial flutter: Secondary | ICD-10-CM | POA: Diagnosis present

## 2016-09-07 DIAGNOSIS — G8929 Other chronic pain: Secondary | ICD-10-CM

## 2016-09-07 DIAGNOSIS — J449 Chronic obstructive pulmonary disease, unspecified: Secondary | ICD-10-CM | POA: Diagnosis present

## 2016-09-07 DIAGNOSIS — I4891 Unspecified atrial fibrillation: Secondary | ICD-10-CM

## 2016-09-07 DIAGNOSIS — N183 Chronic kidney disease, stage 3 (moderate): Secondary | ICD-10-CM | POA: Diagnosis present

## 2016-09-07 DIAGNOSIS — Z79891 Long term (current) use of opiate analgesic: Secondary | ICD-10-CM | POA: Diagnosis not present

## 2016-09-07 DIAGNOSIS — E875 Hyperkalemia: Secondary | ICD-10-CM | POA: Diagnosis present

## 2016-09-07 DIAGNOSIS — Z77090 Contact with and (suspected) exposure to asbestos: Secondary | ICD-10-CM | POA: Diagnosis present

## 2016-09-07 DIAGNOSIS — D696 Thrombocytopenia, unspecified: Secondary | ICD-10-CM | POA: Diagnosis present

## 2016-09-07 DIAGNOSIS — I495 Sick sinus syndrome: Secondary | ICD-10-CM

## 2016-09-07 DIAGNOSIS — Z681 Body mass index (BMI) 19 or less, adult: Secondary | ICD-10-CM | POA: Diagnosis not present

## 2016-09-07 DIAGNOSIS — F329 Major depressive disorder, single episode, unspecified: Secondary | ICD-10-CM | POA: Diagnosis not present

## 2016-09-07 DIAGNOSIS — Z7982 Long term (current) use of aspirin: Secondary | ICD-10-CM

## 2016-09-07 DIAGNOSIS — I5042 Chronic combined systolic (congestive) and diastolic (congestive) heart failure: Secondary | ICD-10-CM | POA: Diagnosis present

## 2016-09-07 DIAGNOSIS — E43 Unspecified severe protein-calorie malnutrition: Secondary | ICD-10-CM | POA: Diagnosis present

## 2016-09-07 DIAGNOSIS — F339 Major depressive disorder, recurrent, unspecified: Secondary | ICD-10-CM | POA: Diagnosis present

## 2016-09-07 DIAGNOSIS — I481 Persistent atrial fibrillation: Secondary | ICD-10-CM | POA: Diagnosis present

## 2016-09-07 DIAGNOSIS — Y92009 Unspecified place in unspecified non-institutional (private) residence as the place of occurrence of the external cause: Secondary | ICD-10-CM | POA: Diagnosis not present

## 2016-09-07 DIAGNOSIS — I130 Hypertensive heart and chronic kidney disease with heart failure and stage 1 through stage 4 chronic kidney disease, or unspecified chronic kidney disease: Secondary | ICD-10-CM | POA: Diagnosis present

## 2016-09-07 DIAGNOSIS — Z950 Presence of cardiac pacemaker: Secondary | ICD-10-CM | POA: Diagnosis not present

## 2016-09-07 DIAGNOSIS — I252 Old myocardial infarction: Secondary | ICD-10-CM | POA: Diagnosis not present

## 2016-09-07 DIAGNOSIS — K219 Gastro-esophageal reflux disease without esophagitis: Secondary | ICD-10-CM | POA: Diagnosis present

## 2016-09-07 DIAGNOSIS — R627 Adult failure to thrive: Secondary | ICD-10-CM

## 2016-09-07 DIAGNOSIS — E785 Hyperlipidemia, unspecified: Secondary | ICD-10-CM | POA: Diagnosis present

## 2016-09-07 DIAGNOSIS — Z91128 Patient's intentional underdosing of medication regimen for other reason: Secondary | ICD-10-CM | POA: Diagnosis not present

## 2016-09-07 DIAGNOSIS — T447X6A Underdosing of beta-adrenoreceptor antagonists, initial encounter: Secondary | ICD-10-CM | POA: Diagnosis present

## 2016-09-07 LAB — GLUCOSE, CAPILLARY: Glucose-Capillary: 94 mg/dL (ref 65–99)

## 2016-09-07 IMAGING — DX DG CHEST 2V
2 series · 2 of 2 positions shown · non-contrast
Comparison: 11/10/2014

CLINICAL DATA: Productive cough for 2 weeks and shortness of Breath

EXAM:
CHEST  2 VIEW

[chest pa]
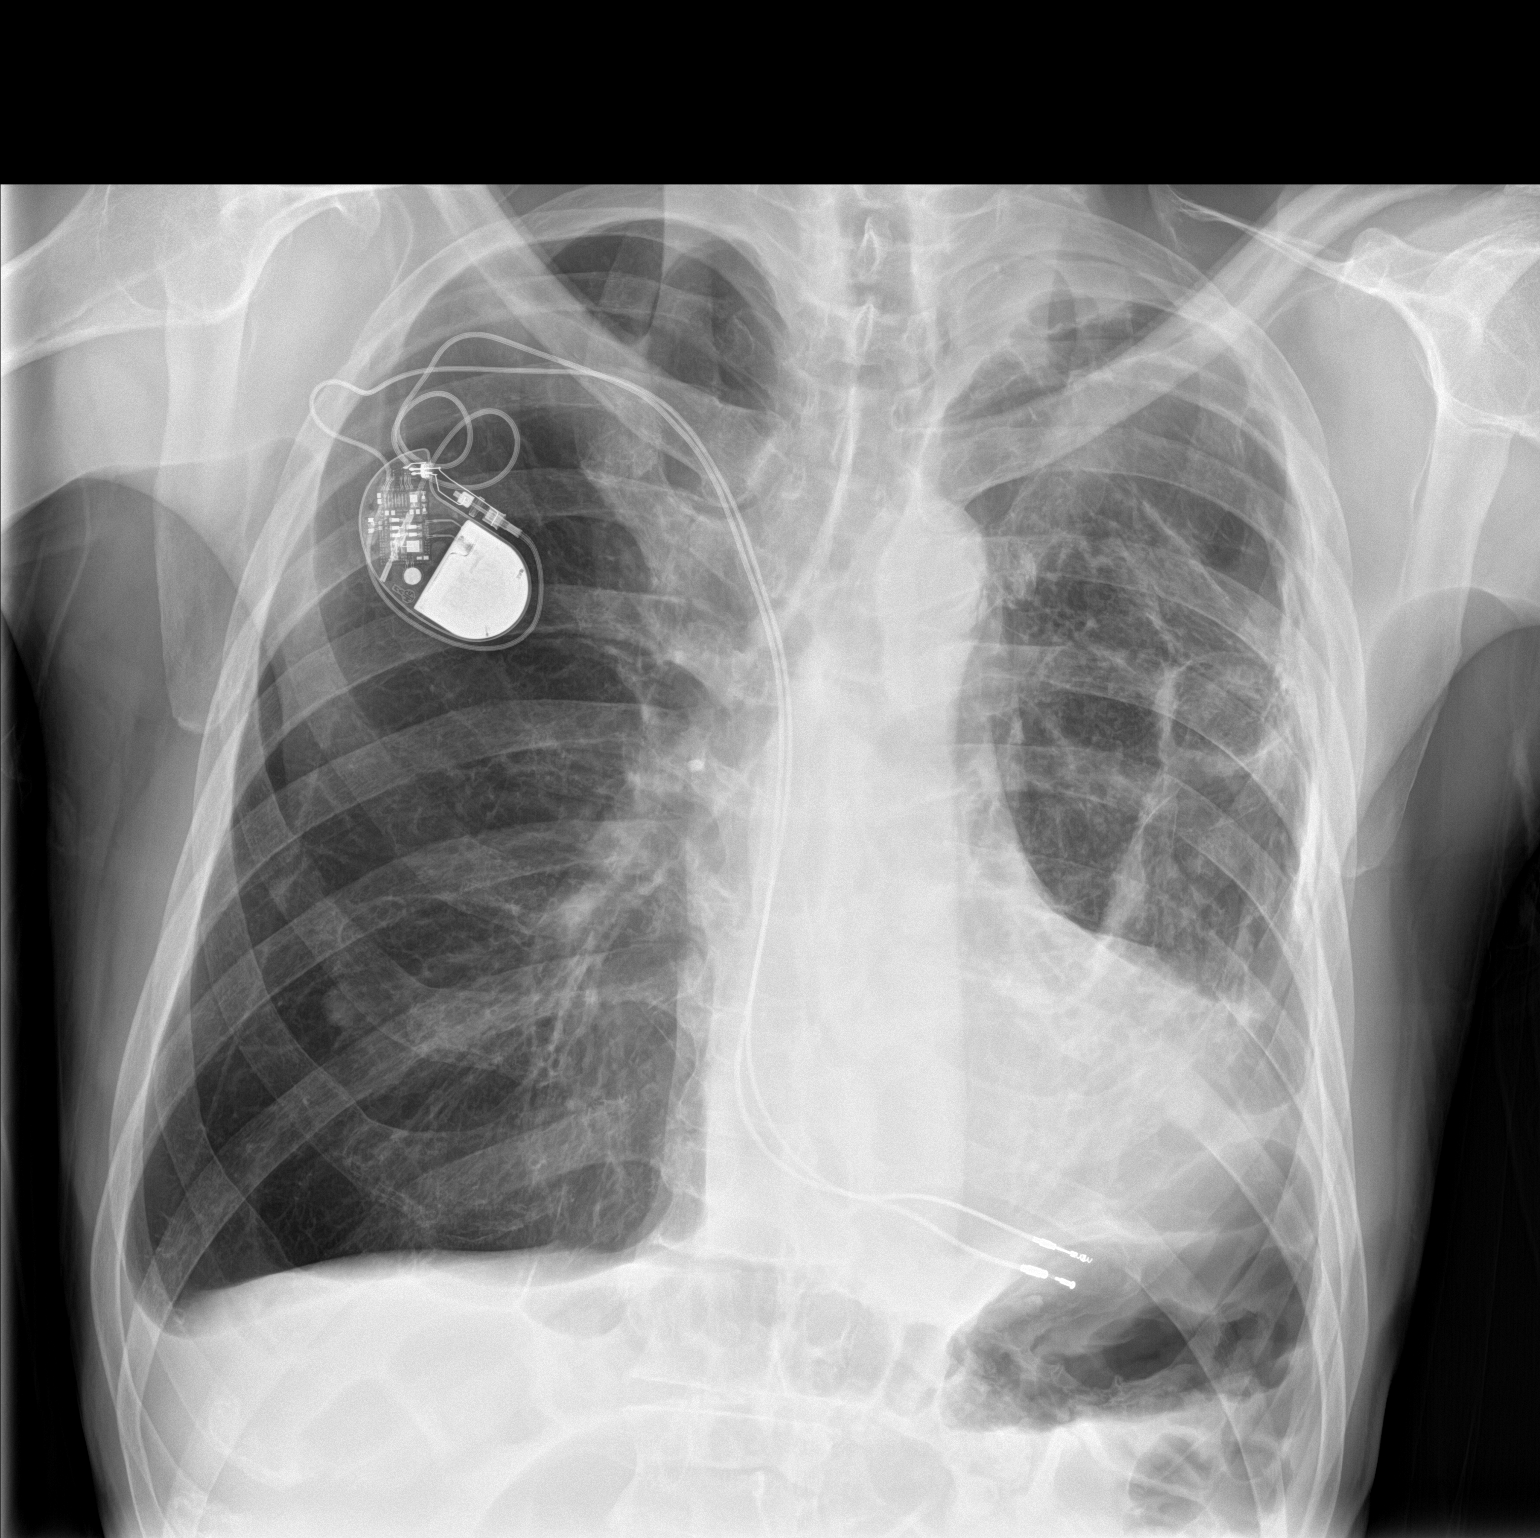

[chest lat]
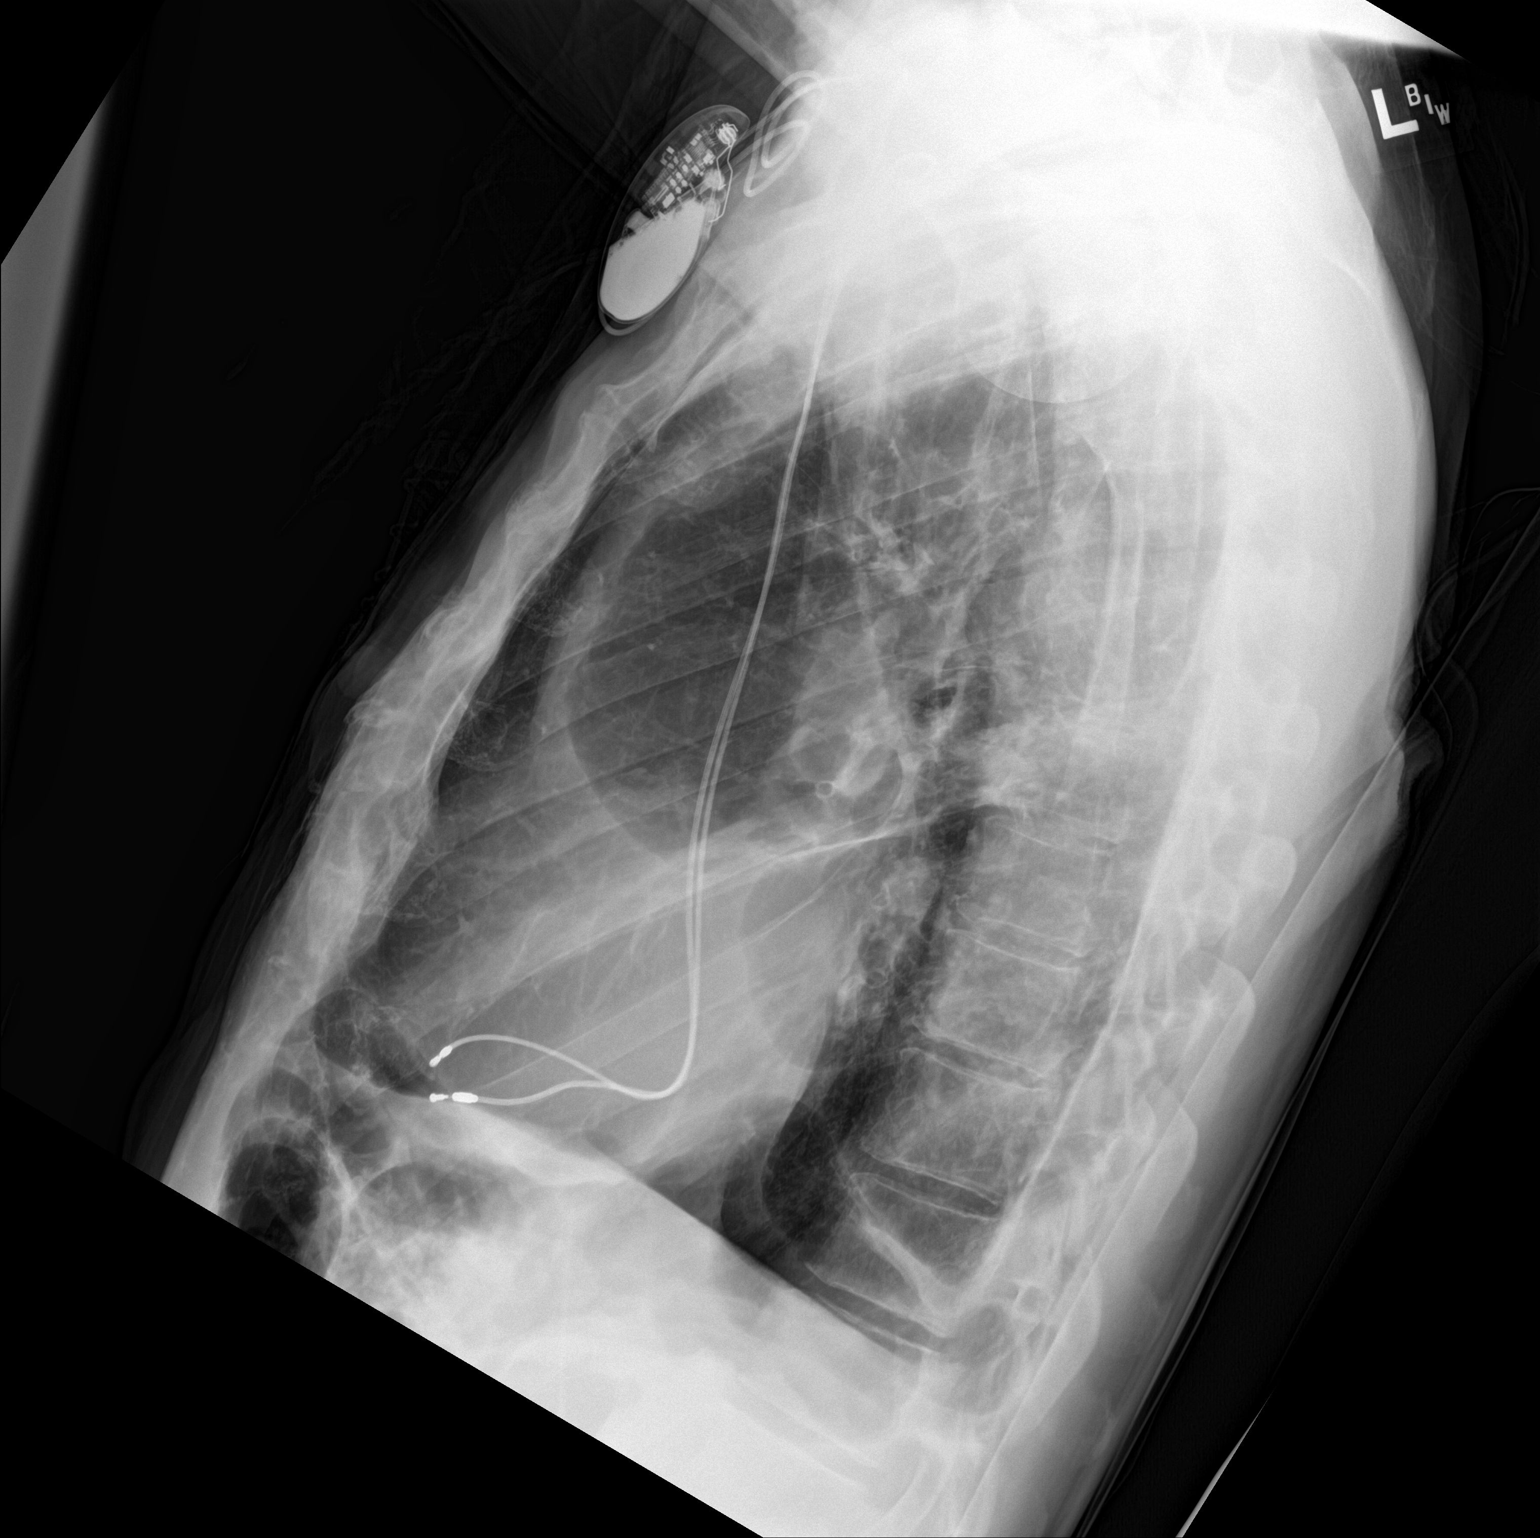

[2 of 2 positions shown; findings below may reference images not displayed]

FINDINGS: Cardiac shadow is stable. Scarring in the left hemi thorax is again
noted stable from the previous exam. Pacing device is again seen and
stable. Hyperinflation of the right lung is noted. No focal
infiltrate or sizable effusion is seen.
IMPRESSION: COPD.

Chronic scarring in the left lung.

## 2016-09-07 MED ORDER — METOPROLOL SUCCINATE ER 50 MG PO TB24
50.0000 mg | ORAL_TABLET | Freq: Two times a day (BID) | ORAL | Status: DC
  Administered 2016-09-07 – 2016-09-10 (×6): 50 mg via ORAL
  Filled 2016-09-07 (×6): qty 1

## 2016-09-07 MED ORDER — METOPROLOL SUCCINATE ER 50 MG PO TB24: 50.0000 mg | ORAL_TABLET | Freq: Every day | ORAL | Status: DC

## 2016-09-07 MED ORDER — SODIUM CHLORIDE 0.9 % IV SOLN
INTRAVENOUS | Status: AC
  Administered 2016-09-07: 19:00:00 via INTRAVENOUS

## 2016-09-07 MED ORDER — SODIUM CHLORIDE 0.9 % IV SOLN
INTRAVENOUS | Status: AC
  Administered 2016-09-07: 13:00:00 via INTRAVENOUS

## 2016-09-07 MED ORDER — KETOROLAC TROMETHAMINE 15 MG/ML IJ SOLN
15.0000 mg | Freq: Four times a day (QID) | INTRAMUSCULAR | Status: DC | PRN
  Administered 2016-09-07 (×2): 15 mg via INTRAVENOUS
  Filled 2016-09-07 (×3): qty 1

## 2016-09-07 NOTE — Progress Notes (Signed)
Notified Internal MEdicine that patients heart rate continues to be in the 130s but BP is still soft 80's/60's.  New orders received.

## 2016-09-07 NOTE — Progress Notes (Signed)
Subjective:   Pt is feeling better. Denies n/v. No other acute events HR increased overnight as metoprolol was held due to soft BPs. He had question about his Hepatitis C screening which was negative when checked on July 28, 2014  Awaiting ACTH results- most likely this is adrenal insufficiency which will respond to hydrocortisone 20/10    Objective:  Vital signs in last 24 hours: Vitals:   09/06/16 2300 09/07/16 0539 09/07/16 0647 09/07/16 1017  BP: 90/62 90/73  100/81  Pulse: (!) 139 (!) 139  (!) 141  Resp:  18    Temp:  98.6 F (37 C)    TempSrc:  Oral    SpO2:  97%  98%  Weight:   121 lb 3.2 oz (55 kg)   Height:       Gen. Very cachectic and emaciated man, in NAD, lying in bed Lungs. Clear bilaterally CV. tachycardia to 140s, no murmur/gallop or rub. Abdomen. NTND, + BS Extremities. No edema, no cyanosis.  Labs. CBC Latest Ref Rng & Units 09/06/2016 09/04/2016 04/30/2016  WBC 4.0 - 10.5 K/uL 6.2 5.6 5.6  Hemoglobin 13.0 - 17.0 g/dL 14.3 15.8 13.1  Hematocrit 39.0 - 52.0 % 43.3 47.2 41.0  Platelets 150 - 400 K/uL 107(L) 148(L) 109(L)   BMP Latest Ref Rng & Units 09/06/2016 09/05/2016 09/04/2016  Glucose 65 - 99 mg/dL 125(H) 114(H) 129(H)  BUN 6 - 20 mg/dL 28(H) 30(H) 32(H)  Creatinine 0.61 - 1.24 mg/dL 1.38(H) 1.45(H) 1.92(H)  BUN/Creat Ratio 10 - 22 - - -  Sodium 135 - 145 mmol/L 139 140 141  Potassium 3.5 - 5.1 mmol/L 4.4 3.8 6.1(H)  Chloride 101 - 111 mmol/L 112(H) 111 109  CO2 22 - 32 mmol/L 17(L) 21(L) 25  Calcium 8.9 - 10.3 mg/dL 8.6(L) 8.4(L) 9.9   TSH. 4.493 AM cortisol. 4.2  DG Chest. FINDINGS: Stable chronic pleural thickening and calcified pleural plaques, greater on the left. These again simulate a left lateral pneumothorax. Stable right nipple shadow. Grossly normal sized heart. Calcified thoracic aorta. Stable right subclavian pacemaker leads. Mild scoliosis and diffuse osteopenia.  IMPRESSION: No acute abnormality.  Stable chronic changes,  as described above.  Assessment/Plan:  The patient is 71 year old male with a past medical history of A. fib with tachybradycardia syndrome status post pacemaker placement, chronic combined systolic and diastolic congestive heart failure, COPD, chronic pain, CKD and depression who presents as an admission from the clinic for ongoing failure to thrive and weight loss.  Likely Adrenal insufficiency, leading to failure to thrive. His AM cortisol levels are low-most likely the cause of his failure to thrive.ACTH levels are still pending. CXR was negative which was checked due to concern for mesothelioma reoccurrence.  Will perform stim test according to ACTH level  PT and OT are not recommending SNF placement and pt prefers to go home.   Overnight, Pt's blood pressures have been on the lower side- this is most likely due to his adrenal insufficiency and will improve once hydrocortisone is started   -await ACTH results before starting steroids  -continue marinol 5 mg daily -continue remeron   A. fib with tachycardia bradycardia syndrome. Overnight, pt's HR has been in the 100-140s, as metoprolol was held.    -Continue Apixaban 2.5 mg BID - Metoprolol extended release 100 mg- received today, will decrease the metoprolol to 50 mg starting tomorrow. Resume home dose on discharge.  - Aspirin 81 mg once daily - Rosuvastatin 20 mg once daily  Chronic  pain  -Toradol 15 mg q6 PRN while blood pressure remains low -resume home oxy on discharge    Depression. Might be a contributory factor for his acute on chronic failure to thrive condition. -Continue his home dose of Remeron 15 mg daily.  Dispo: Anticipated discharge in approximately 2 day(s).   Burgess Estelle, MD 09/07/2016, 10:31 AM

## 2016-09-07 NOTE — Progress Notes (Signed)
  Date: 09/07/2016  Patient name: [name]  Medical record number: [id number]  Date of birth: June 15, 1945   I have seen and evaluated this patient and I have discussed the plan of care with the house staff. Please see their note for complete details. I concur with their findings with the following additions/corrections: cortisol low nl. Waiting on ACTH results. Prior W/U for adrenal insuff for same sxs 2015 was negative. Wait on tx until W/U complete.  [name], MD 09/07/2016, 3:46 PM

## 2016-09-07 NOTE — Progress Notes (Signed)
Patient slept during the night. BP remains soft. HR still in 130-140 bpm. Pt asymptomatic. MD on call Lovena Le updated again this morning with VS and HR. No new orders. Will continue to monitor.   Cesare Sumlin, RN

## 2016-09-07 NOTE — Progress Notes (Signed)
Patient requested oxycodone, pain medication held due to soft BP.  Offered Tylenol, pt refusing Tylenol at this time. Will follow up with day shift.  Will continue to monitor.  Lidya Mccalister, RN

## 2016-09-08 LAB — BASIC METABOLIC PANEL
Anion gap: 3 — ABNORMAL LOW (ref 5–15)
BUN: 32 mg/dL — ABNORMAL HIGH (ref 6–20)
CO2: 20 mmol/L — AB (ref 22–32)
Calcium: 8.7 mg/dL — ABNORMAL LOW (ref 8.9–10.3)
Chloride: 119 mmol/L — ABNORMAL HIGH (ref 101–111)
Creatinine, Ser: 1.58 mg/dL — ABNORMAL HIGH (ref 0.61–1.24)
GFR, EST AFRICAN AMERICAN: 49 mL/min — AB (ref >60)
GFR, EST NON AFRICAN AMERICAN: 42 mL/min — AB (ref >60)
Glucose, Bld: 108 mg/dL — ABNORMAL HIGH (ref 65–99)
Potassium: 5.1 mmol/L (ref 3.5–5.1)
Sodium: 142 mmol/L (ref 135–145)

## 2016-09-08 LAB — GLUCOSE, CAPILLARY: Glucose-Capillary: 83 mg/dL (ref 65–99)

## 2016-09-08 MED ORDER — GLYCERIN (LAXATIVE) 2.1 G RE SUPP
1.0000 | Freq: Every day | RECTAL | Status: DC | PRN
  Filled 2016-09-08: qty 1

## 2016-09-08 MED ORDER — OXYCODONE-ACETAMINOPHEN 5-325 MG PO TABS
1.0000 | ORAL_TABLET | Freq: Four times a day (QID) | ORAL | Status: DC | PRN
  Administered 2016-09-08: 2 via ORAL
  Administered 2016-09-08 – 2016-09-09 (×3): 1 via ORAL
  Administered 2016-09-09 – 2016-09-10 (×2): 2 via ORAL
  Filled 2016-09-08 (×3): qty 2
  Filled 2016-09-08 (×2): qty 1
  Filled 2016-09-08: qty 2

## 2016-09-08 NOTE — Plan of Care (Signed)
Problem: Bowel/Gastric: Goal: Will not experience complications related to bowel motility Outcome: Not Progressing Patient receiving Miralax and Senokot; defers suppository at this time, states he is not in any discomfort but will let nursing staff know if he decides to take suppository

## 2016-09-08 NOTE — Progress Notes (Signed)
   Subjective: Patient was feeling better and since this morning. Stating that his appetite is improving and overall he is feeling much better. He was little upset that he is not getting his Percocet anymore, stating that really helped with his pain and he is experiencing intermittent generalized body aches. He was also complaining of constipation-his last bowel movement was on Wednesday.  Objective:  Vital signs in last 24 hours: Vitals:   09/07/16 1946 09/07/16 2032 09/07/16 2217 09/08/16 0617  BP: 92/68 (!) 87/71 100/74 (!) 88/70  Pulse: (!) 134 (!) 134 (!) 129 86  Resp: 18 17  20   Temp: 98.1 F (36.7 C) 98.7 F (37.1 C)  97.5 F (36.4 C)  TempSrc: Oral Oral  Oral  SpO2: 100% 100%  100%  Weight:    126 lb 12.8 oz (57.5 kg)  Height:       Gen. Cachectic and emaciated man,in no acute distress. Lungs.Clear bilaterally CV.Mild tachycardia,no murmur/gallop or rub. Abdomen.Soft, nontender,nondistended,bowel sounds positive. Extremities.No edema, no cyanosis.  Labs. 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  Acute on chronic failure to thrive.Can be multifactorial including some adrenal insufficiency and worsening of depression. We are still  waiting on ACTH levels. Once we have that information we can decide on low versus high-dose cosyntropin stimulation test. -He will get benefit from home health aide and Meals on Wheels to provide him with proper meals, as he is unable to prepare meals for himself. -Continue Marinol 5 mg twice daily.  A. fib with tachycardia bradycardia syndrome. Still mildly tachycardic, remained asymptomatic. -Continue Apixaban 2.5 mg BID - Metoprolol extended release 100 mg - Aspirin 81 mg once daily - Rosuvastatin 20 mg once daily  Combined heart failure.Last echo done in March 2017 shows ejection fraction of 30%. No current sign of acute volumeoverload or exacerbation.  CKD. Creatinine at baseline. -Keep monitoring BMP daily.  Hyperkalemia. Resolved  Chronic constipation. He did not had any BM despite of getting MiraLAX daily. He was interested on some spots treated today. -Glycerin suppository. -Continue MiraLAX daily. -Encouraged him to increase his fluid intake.  Dispo: Anticipated discharge in approximately 1-2 day(s).   Lorella Nimrod, MD 09/08/2016, 7:28 AM Pager: [phone]

## 2016-09-08 NOTE — Progress Notes (Signed)
Of note patient has not gotten out of bed at all for last 2 days in spite of encouragement from RN.  Patient also does not want to be turned and remains on his back continuously.  RN did alert patient that he is at risk for skin breakdown d/t his loss of subcutaneous fat.  Patient verbalized understanding but still deferred to get out of bed or turn.

## 2016-09-08 NOTE — Progress Notes (Signed)
Spoke with provider on call about patients pain who would prefer patient take the Toradol.  RN brought Toradol to room, patient adamant that there is no need for him to take this as it will not help him.  Patient states if he can't have what works he just won't take anything.

## 2016-09-08 NOTE — Progress Notes (Signed)
Patient states Toradol does nothing for his pain but MD who rounded told him he could have his Percocet back.  Will await order.

## 2016-09-08 NOTE — Progress Notes (Signed)
Patient called for RN stating he needs his Percocet.  RN reiterated to patient that the MD wanted him to take the Toradol d/t his low BP.  Patient upset, states if he can't get what works here he will "pack up" and go where he can get it.  MD notified of patients continued request for Percocet, new orders received.

## 2016-09-09 DIAGNOSIS — I481 Persistent atrial fibrillation: Secondary | ICD-10-CM

## 2016-09-09 LAB — BASIC METABOLIC PANEL
Anion gap: 6 (ref 5–15)
BUN: 38 mg/dL — ABNORMAL HIGH (ref 6–20)
CALCIUM: 8.7 mg/dL — AB (ref 8.9–10.3)
CHLORIDE: 113 mmol/L — AB (ref 101–111)
CO2: 21 mmol/L — ABNORMAL LOW (ref 22–32)
CREATININE: 1.51 mg/dL — AB (ref 0.61–1.24)
GFR calc non Af Amer: 44 mL/min — ABNORMAL LOW (ref >60)
GFR, EST AFRICAN AMERICAN: 51 mL/min — AB (ref >60)
Glucose, Bld: 78 mg/dL (ref 65–99)
Potassium: 5 mmol/L (ref 3.5–5.1)
Sodium: 140 mmol/L (ref 135–145)

## 2016-09-09 LAB — ACTH STIMULATION, 3 TIME POINTS
Cortisol, 30 Min: 17.7 ug/dL
Cortisol, 60 Min: 20.4 ug/dL
Cortisol, Base: 8.3 ug/dL

## 2016-09-09 LAB — GLUCOSE, CAPILLARY: GLUCOSE-CAPILLARY: 74 mg/dL (ref 65–99)

## 2016-09-09 LAB — ACTH: C206 ACTH: 7.6 pg/mL (ref 7.2–63.3)

## 2016-09-09 MED ORDER — COSYNTROPIN 0.25 MG IJ SOLR
0.2500 mg | Freq: Once | INTRAMUSCULAR | Status: AC
  Administered 2016-09-09: 0.25 mg via INTRAVENOUS
  Filled 2016-09-09: qty 0.25

## 2016-09-09 MED ORDER — METOPROLOL SUCCINATE ER 50 MG PO TB24: 50.0000 mg | ORAL_TABLET | Freq: Two times a day (BID) | ORAL | 2 refills | Status: DC

## 2016-09-09 MED ORDER — BOOST / RESOURCE BREEZE PO LIQD: 1.0000 | Freq: Three times a day (TID) | ORAL | 5 refills | Status: DC

## 2016-09-09 MED ORDER — BUPROPION HCL ER (SR) 100 MG PO TB12
100.0000 mg | ORAL_TABLET | Freq: Two times a day (BID) | ORAL | Status: DC
  Administered 2016-09-10 (×2): 100 mg via ORAL
  Filled 2016-09-09 (×4): qty 1

## 2016-09-09 NOTE — Progress Notes (Signed)
Internal Medicine Attending:   I saw and examined the patient. I reviewed the resident's note and I agree with the resident's findings and plan as documented in the resident's note. We have been keeping a stroke or here the weekend as part of her limited investigation into his failure to thrive. He previously was found to have a low a.m. cortisol 4.2 which suggested possible adrenal insufficiency. We have been weak for an ACTH which we checked a that time to come back to help guide our choice of ACTH stimulation testing. However this has not come back yet and so we decided this morning to do the standard cosyntropin test. This is come back with his cortisol responding to above 20 which effectively rules out adrenal insufficiency. I therefore suspect his failure to thrive and weight loss is most likely due to his comorbid conditions of heart failure, COPD, and depression. We have had PT and OT evaluate him as well as engaged in social work to help see what we can do to improve his social situation. Otherwise it appears that he is independent in his ADLs by PT and OT evaluation therefore we will discharge him home today. We'll try to arrange for continued outpatient follow-up.  To clarify his diagnosis He has persistent atrial fibulation, his EKG on admission revealed Typical Atrial flutter at that time.

## 2016-09-09 NOTE — Progress Notes (Signed)
Patients BP has been running soft, SBP high 80's-90's. But HR has been in the 130's. No complaints of of any pain but stated he feels tired because of the Remeron that he took last night.

## 2016-09-09 NOTE — Care Management Note (Signed)
Case Management Note  Patient Details  Name: [name] MRN: [id number] Date of Birth: 1945/09/30  Subjective/Objective: Admitted with Failure to thrive                  Action/Plan: Patient lives at home alone, independent, continue to drive his car; he has a girlfriend that helps in his care along with his son; PCP Zada Finders, MD; has private insurance with [facility] Medicare with prescription drug coverage; His insurance will send out meals and he is on the waiting list for Meals on Wheels. Patient states " I just don't have an apatite for anything."Patient does not qualify for [facility] service due to not being home bound/ he is independent of his ADL's; .  Expected Discharge Date:     09/09/2016             Expected Discharge Plan:  Home/Self Care    Discharge planning Services  CM Consult    Status of Service:   Completed  Sherrilyn Rist [phone] 09/09/2016, 2:45 PM

## 2016-09-09 NOTE — Progress Notes (Signed)
Patient refused to ambulate but agreed to sit up on the chair with no assistance.

## 2016-09-09 NOTE — Progress Notes (Signed)
   Subjective: Patient was feeling better and since this morning. According to nursing notes and physical therapy note that he was declining any physical therapy and do not want to get out of bed stating that he is feeling very weak.  Objective:  Vital signs in last 24 hours: Vitals:   09/09/16 0441 09/09/16 0536 09/09/16 0947 09/09/16 1132  BP: 103/70 97/76 92/66  (!) 86/63  Pulse: 98 (!) 133 (!) 128 (!) 127  Resp: 18 19  18   Temp: 97.9 F (36.6 C) 97.8 F (36.6 C)  97.7 F (36.5 C)  TempSrc: Oral Oral  Oral  SpO2: 98% 100%  99%  Weight:  128 lb 14.4 oz (58.5 kg)    Height:       Gen. Cachectic and emaciated man,Laying comfortably in bed,in no acute distress. Lungs.Clear bilaterally CV.Tachycardia,no murmur/gallop or rub. Abdomen.Soft, nontender,nondistended,bowel sounds positive. Extremities.No edema, no cyanosis. Pulses intact and symmetrical.  Labs. BMP Latest Ref Rng & Units 09/09/2016 09/08/2016 09/06/2016  Glucose 65 - 99 mg/dL 78 108(H) 125(H)  BUN 6 - 20 mg/dL 38(H) 32(H) 28(H)  Creatinine 0.61 - 1.24 mg/dL 1.51(H) 1.58(H) 1.38(H)  BUN/Creat Ratio 10 - 22 - - -  Sodium 135 - 145 mmol/L 140 142 139  Potassium 3.5 - 5.1 mmol/L 5.0 5.1 4.4  Chloride 101 - 111 mmol/L 113(H) 119(H) 112(H)  CO2 22 - 32 mmol/L 21(L) 20(L) 17(L)  Calcium 8.9 - 10.3 mg/dL 8.7(L) 8.7(L) 8.6(L)   ACTH stimulation, 3 time points  Order: [id number]  Status:  Final result Visible to patient:  No (Not Released) Next appt:  10/09/2016 at 03:45 PM in Cardiology Thompson Grayer, MD)    Ref Range & Units 09:32 74yr ago   Cortisol, Base ug/dL 8.3  7.8CM   Comments: NO NORMAL RANGE ESTABLISHED FOR THIS TEST   Cortisol, 30 Min ug/dL 17.7  24.9R    Cortisol, 60 Min ug/dL 20.4  28.8R, CM   Resulting Agency  [facility]       Assessment/Plan:  The patient is 71 year old male with a past medical history of A. fib with tachybradycardia syndrome status post pacemaker placement, chronic  combined systolic and diastolic congestive heart failure, COPD, chronic pain, CKD and depression who presents as an admission from the clinic for ongoing failure to thrive and weight loss.  Acute on chronic failure to thrive.His cosyntropin stimulation test was negative for any adrenal insufficiency. Patient was refusing to get out of bed today with physical therapy and his nurse stating that he is feeling very weak today. According to nursing starts he is not eating much today. When reevaluated patient he said that Remeron makes him really tired-if we can stop that medicine that he might feel better tomorrow morning will be discharged. -Discontinue Remeron. -Discontinue Protonix as that can cause failure to thrive. -Restart Wellbutrin 100 mg twice daily.  A. fib with tachycardia bradycardia syndrome. Still mildly tachycardic, remained asymptomatic. -His metoprolol was decreased to 50 mg daily because of his softer blood pressure. -Continue Apixaban 2.5 mg BID -Continue aspirin and Rosuvastatin daily.  CKD. Creatinine at baseline. -Keep monitoring BMP daily.  Hyperkalemia. Resolved.  Dispo: Anticipated discharge in approximately 1 day(s).   Lorella Nimrod, MD 09/09/2016, 2:12 PM Pager: [phone]

## 2016-09-09 NOTE — Progress Notes (Signed)
PT Cancellation Note  Patient Details Name: [name] MRN: [id number] DOB: 11-24-1945   Cancelled Treatment:    Reason Eval/Treat Not Completed: Patient declined, no reason specified Pt declined mobility and reported feeling too tired and weak to get OOB. Therapist educated pt on benefits of OOB mobility and provided max encouragement to mobilize. PT will continue to follow acutely.    Salina April, PTA Pager: [phone]   09/09/2016, 1:49 PM

## 2016-09-10 ENCOUNTER — Other Ambulatory Visit: Payer: Self-pay | Admitting: *Deleted

## 2016-09-10 ENCOUNTER — Encounter: Payer: Self-pay | Admitting: *Deleted

## 2016-09-10 DIAGNOSIS — I700 Atherosclerosis of aorta: Secondary | ICD-10-CM

## 2016-09-10 DIAGNOSIS — J61 Pneumoconiosis due to asbestos and other mineral fibers: Secondary | ICD-10-CM

## 2016-09-10 DIAGNOSIS — E43 Unspecified severe protein-calorie malnutrition: Secondary | ICD-10-CM

## 2016-09-10 DIAGNOSIS — Z681 Body mass index (BMI) 19 or less, adult: Secondary | ICD-10-CM

## 2016-09-10 DIAGNOSIS — F339 Major depressive disorder, recurrent, unspecified: Secondary | ICD-10-CM

## 2016-09-10 DIAGNOSIS — Z9119 Patient's noncompliance with other medical treatment and regimen: Secondary | ICD-10-CM

## 2016-09-10 DIAGNOSIS — D696 Thrombocytopenia, unspecified: Secondary | ICD-10-CM

## 2016-09-10 LAB — GLUCOSE, CAPILLARY
GLUCOSE-CAPILLARY: 47 mg/dL — AB (ref 65–99)
Glucose-Capillary: 78 mg/dL (ref 65–99)

## 2016-09-10 MED ORDER — POLYETHYLENE GLYCOL 3350 17 G PO PACK: 17.0000 g | PACK | Freq: Every day | ORAL | 0 refills | Status: DC

## 2016-09-10 MED ORDER — BUPROPION HCL ER (SR) 100 MG PO TB12: 100.0000 mg | ORAL_TABLET | Freq: Two times a day (BID) | ORAL | 0 refills | Status: DC

## 2016-09-10 NOTE — Consult Note (Addendum)
   [facility] Inpatient Consult   09/10/2016  [name] Mar 06, 1946 [id number]   Telephone call received from [name] who now states he can not afford his medications from hospital discharge. [name] asks "what y'all gonna do to help me"?  Will make referral to Roxana with [facility] about telephone call from [name].  Addendum: Telephone call made back to [name] to make aware pharmacy referral was made on his behalf. Also discussed that he had a pharmacy plan that has the included patient assistance. He states "who signed me up for that"? His medication copays range from 3.35 to 10.95. [name] was not as pleasant as he was during the bedside encounter earlier today.   Spoke with [name]. Referral for [facility] LCSW and [facility] Pharmacist made as well due to concerns [name] mentioned earlier.   [facility] Care Management will attempt to engage [name] again for services.  Marthenia Rolling, MSN-Ed, RN,BSN [facility] Liaison [phone]

## 2016-09-10 NOTE — Progress Notes (Signed)
Internal Medicine Attending:   I saw and examined the patient. I reviewed the resident's note and I agree with the resident's findings and plan as documented in the resident's note. Mr Brathwaite is feeling much better, he has a neighbor friend who will help look in on him when he goes home.  He reports eating more of his meals.  He did have an abnormal CBG this AM of 47, he reports that he was completely asymptomatic at the time and had no idea his glucose level was low, he reports no change after administration of orange juice and improvement on repeat CBG therefore I am a little less convinced he truly had hypoglycemia and this may just be error of the POC machine.  Given that we have ruled out adrenal insufficieny we will discharge him home today, he is agreeable with this plan.  As far as the cause of his failure to thrive I am most concerned that it is due to his poorly controlled chronic medical issues of CHF, COPD, and depression.  However medication induced anorexia is also considered.  Therefore I think it will be important to minimize his pill burden.  Mirtazapine has not particularly worked well for his depression or as an appetite stimulant, we have discontinued this in favor of starting Wellbutrin.  His GERD is currently well controlled, so I think we will discontinue his PPI, if symptoms return we could restart an H2 blocker as this is less associated with appetite suppression.  Finally we have considered his chronic opiate medications, they are certainly a potential cause however would have to be weaned slowly.  He is not ready to start this process today as he feels he greatly benefits from the pain control they provide.

## 2016-09-10 NOTE — Discharge Summary (Signed)
Name: [name] MRN: [id number] DOB: 1945-08-11 71 y.o. PCP: Travis Finders, MD  Date of Admission: 09/04/2016  6:21 PM Date of Discharge: 09/10/2016 Attending Physician: Travis Groves, DO  Discharge Diagnosis: 1. Failure to thrive because of his chronic comorbidities.  Principal Problem:   Failure to thrive in adult Active Problems:   Essential hypertension   COPD (chronic obstructive pulmonary disease) (HCC)   Long term current use of anticoagulant therapy   Major depressive disorder, recurrent episode (HCC)   Chronic combined systolic and diastolic congestive heart failure (HCC)   Longstanding persistent atrial fibrillation (HCC)   Chronic pain   Long term current use of opiate analgesic   CKD (chronic kidney disease) stage 3, GFR 30-59 ml/min   Asbestosis (HCC)   Protein-calorie malnutrition, severe   Non-compliance with treatment   Abdominal aortic atherosclerosis (HCC)   BMI less than 19,adult   Thrombocytopenia ([name])   Discharge Medications: Allergies as of 09/10/2016      Reactions   Amiodarone Other (See Comments)   Thyroiditis in the setting of amiodarone use   Penicillins Rash   Has patient had a PCN reaction causing immediate rash, facial/tongue/throat swelling, SOB or lightheadedness with hypotension: Yes Has patient had a PCN reaction causing severe rash involving mucus membranes or skin necrosis: No Has patient had a PCN reaction that required hospitalization No Has patient had a PCN reaction occurring within the last 10 years: No If all of the above answers are "NO", then may proceed with Cephalosporin use.      Medication List    STOP taking these medications   buPROPion 100 MG tablet Commonly known as:  WELLBUTRIN Replaced by:  buPROPion 100 MG 12 hr tablet   meclizine 25 MG tablet Commonly known as:  ANTIVERT   mirtazapine 15 MG tablet Commonly known as:  REMERON   omeprazole 40 MG capsule Commonly known as:  PRILOSEC   OXYGEN       TAKE these medications   albuterol 108 (90 Base) MCG/ACT inhaler Commonly known as:  VENTOLIN HFA Inhale 2 puffs into the lungs every 6 (six) hours as needed for wheezing or shortness of breath.   apixaban 2.5 MG Tabs tablet Commonly known as:  ELIQUIS Take 1 tablet (2.5 mg total) by mouth 2 (two) times daily.   aspirin 81 MG chewable tablet Chew 1 tablet (81 mg total) by mouth daily.   buPROPion 100 MG 12 hr tablet Commonly known as:  WELLBUTRIN SR Take 1 tablet (100 mg total) by mouth 2 (two) times daily. Replaces:  buPROPion 100 MG tablet   dronabinol 5 MG capsule Commonly known as:  MARINOL Take 1 capsule (5 mg total) by mouth 2 (two) times daily before lunch and supper.   feeding supplement Liqd Take 1 Container by mouth 3 (three) times daily between meals.   metoprolol succinate 50 MG 24 hr tablet Commonly known as:  TOPROL-XL Take 1 tablet (50 mg total) by mouth 2 (two) times daily. Take with or immediately following a meal. What changed:  how much to take   oxyCODONE-acetaminophen 10-325 MG tablet Commonly known as:  PERCOCET Take 1 tablet by mouth every 4 (four) hours as needed for pain. Do not take more than 5 tablets in 24 hours.   polyethylene glycol packet Commonly known as:  MIRALAX / GLYCOLAX Take 17 g by mouth daily.   rosuvastatin 20 MG tablet Commonly known as:  CRESTOR Take 1 tablet (20 mg total) by mouth  daily at 6 PM.   tiotropium 18 MCG inhalation capsule Commonly known as:  SPIRIVA Place 1 capsule (18 mcg total) into inhaler and inhale daily.       Disposition and follow-up:   [name] was discharged from [facility] in Stable condition.  At the hospital follow up visit please address:  1.  His weight-he was 128 pounds on discharge.  -His appetite and ability to get meals. -Encouraged him to decrease his use of pain medication as opioids can also cause decreased appetite and failure to thrive.  2.  Labs /  imaging needed at time of follow-up: None  3.  Pending labs/ test needing follow-up: None  Follow-up Appointments: Follow-up Information    Travis Finders, MD. Go on 09/17/2016.   Specialty:  Internal Medicine Why:  At 3:45 PM. 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  Failure to thrive in adult. Patient has an history of chronic failure to thrive with no clear etiology, he has an extensive workup done in the past which was negative for any malignancy. He has a past medical history of asbestosis exposure leading to calcified pleural plaques, has no current pulmonary complaints, and his repeat chest x-ray was without any changes.  History of depression and social condition being alone and unable to prepare meals can be contributory.  We did not did further workup with more imaging as his CT abdomen and complete PET scan done last year was negative. Even if we can find a cause he might not be able to tolerate the treatment.  We did work him up for adrenal insufficiency. His a.m. cortisol level were mildly low and ACTH levels were within lower normal limit. Cosyntropin stimulation test was normal. Which can pretty much rule out adrenal insufficiency.  We offered him a placement at skilled nursing facility, which he refused as he wants to stay independent. He does not qualify for any in-home services as he is not homebound. He was already enrolled for Meals on Wheels program-still on waiting list, he was advised to keep calling them.  He was offered by a friend that she will provide meals for  him, that might be the best option at this time.  His weight remained stable at 128 pound during hospital stay. His appetite improved and he was able to finish some of his meals.  We switched his Remeron to Wellbutrin because of his preference, he was stating that Remeron keeps him drowsy the next day. We also stopped his Protonix as PPI can also cause failure to thrive occasionally. That can be restarted if there is worsening of his GERD symptoms.  We continued his home dose of Marinol 5 mg daily.  He should be encouraged to use less pain medication as opioids can also cause failure to thrive and decreased appetite.  If he continue with his failure to thrive-palliative care involvement might be helpful.  A. fib with tachycardia bradycardia syndrome. He was very tachycardic on admission as he stopped all his medications himself. After restarting his home dose of metoprolol extended release 100 mg-his rate  become under control. Later his dose of metoprolol was decreased to 50 mg because of his softer blood pressure. We restarted his Apixaban 2.5 mg BID, and also continued his home dose of aspirin and Rosuvastatin 20 mg once daily.  Combined heart failure.Last echo done in March 2017 shows ejection fraction of 30%. No current sign of acute volumeoverload or exacerbation.  Acute on chronic Kidney diseasewith hyperkalemia. On admission he his potassium was 6.1 with creatinine of 1.92, baseline around 1.4. His hyperkalemia resolved with IV fluids and creatinine came back to baseline. Potassium was 4.4 and creatinine was 1.38 on subsequent check.  Chronic constipation. He was complaining of worsened constipation most likely because of poor by mouth intake and use of opioids. It resolved with daily use of MiraLAX, and glycerin suppository. He was advised to take MiraLAX daily because of his chronic or pedal use.  Chronic pain. No current c/o any exacerbation. We tried to discontinue his  Percocet, he was very resistant and refusing to take any other pain med. We restarted his home dose of Percocet. We told him that this might be causing his failure to thrive, apparently he does not agreed with [place]. He might get benefit if we can back off from opiate seen the future.  Depression. Most likely a contributory factor for his current failure to thrive. He lives alone and unable to prepare meals for himself. He was also concerned about Remeron as it was making him drowsy and lethargic next day. We discontinued Remeron and restarted him on Wellbutrin 100 mg twice a day.  Discharge Vitals:   BP 93/62 (BP Location: Right Arm)   Pulse 70   Temp 98.2 F (36.8 C) (Oral)   Resp 18   Ht 6\' 2"  (1.88 m)   Wt 128 lb 1.6 oz (58.1 kg)   SpO2 94%   BMI 16.45 kg/m   Gen.Cachectic and emaciated man,in no acute distress. Lungs.Clear bilaterally, no added sound. CV.RRR,no murmur/gallop or rub. Abdomen.Soft, nontender,nondistended,bowel sounds positive. Extremities.No edema, no cyanosis.Pulses 2+ bilaterally.  Pertinent Labs, Studies, and Procedures:  CBC Latest Ref Rng & Units 09/06/2016 09/04/2016 04/30/2016  WBC 4.0 - 10.5 K/uL 6.2 5.6 5.6  Hemoglobin 13.0 - 17.0 g/dL 14.3 15.8 13.1  Hematocrit 39.0 - 52.0 % 43.3 47.2 41.0  Platelets 150 - 400 K/uL 107(L) 148(L) 109(L)   CMP Latest Ref Rng & Units 09/09/2016 09/08/2016 09/06/2016  Glucose 65 - 99 mg/dL 78 108(H) 125(H)  BUN 6 - 20 mg/dL 38(H) 32(H) 28(H)  Creatinine 0.61 - 1.24 mg/dL 1.51(H) 1.58(H) 1.38(H)  Sodium 135 - 145 mmol/L 140 142 139  Potassium 3.5 - 5.1 mmol/L 5.0 5.1 4.4  Chloride 101 - 111 mmol/L 113(H) 119(H) 112(H)  CO2 22 - 32 mmol/L 21(L) 20(L) 17(L)  Calcium 8.9 - 10.3 mg/dL 8.7(L) 8.7(L) 8.6(L)  Total Protein 6.5 - 8.1 g/dL - - -  Total Bilirubin 0.3 - 1.2 mg/dL - - -  Alkaline Phos 38 - 126 U/L - - -  AST 15 - 41 U/L - - -  ALT 17 - 63 U/L - - -   ACTH stimulation, 3 time points  Order: [id number]   Status:  Final result Visible to patient:  No (Not Released) Next appt:  10/09/2016 at 03:45 PM in Cardiology Thompson Grayer, MD)    Ref Range & Units 09:32 36yr ago   Cortisol, Base ug/dL 8.3  7.8CM   Comments: NO NORMAL RANGE ESTABLISHED FOR THIS TEST   Cortisol, 30 Min  ug/dL 17.7  24.9R    Cortisol, 60 Min ug/dL 20.4  28.8R, CM   Resulting Agency  [facility]        TSH. 4.493 AM cortisol. 4.2 ACTH. 7.6  DG Chest. FINDINGS: Stable chronic pleural thickening and calcified pleural plaques, greater on the left. These again simulate a left lateral pneumothorax. Stable right nipple shadow. Grossly normal sized heart. Calcified thoracic aorta. Stable right subclavian pacemaker leads. Mild scoliosis and diffuse osteopenia.  IMPRESSION: No acute abnormality. Stable chronic changes, as described above.  Discharge Instructions: Discharge Instructions    AMB Referral to [place] Management    Complete by:  As directed    Please assign to [place] for transition of care. Please assess need for referral for [facility] Pharmacist and [facility] LCSW upon transition of care calls.Please see liaison notes on 09/10/16. Discharging home today 4/24. Written consent obtained. Thanks. Marthenia Rolling, [place], [facility] [phone]   Reason for consult:  Please assign to [place]   Expected date of contact:  1-3 days (reserved for hospital discharges)   AMB Referral to [place] Management    Complete by:  As directed    Please assign to [facility] Pharmacist. Patient called liaison today for medication affordability. [facility] [phone]   Reason for consult:  Please assign to [facility] Pharmacist   Expected date of contact:  1-3 days (reserved for hospital discharges)   AMB Referral to [place] Management    Complete by:  As directed    Please assign to Community LCSW. Discharged home today. Patient stated he  needed assist with CAPS application, meals on wheels or [employer] Meals. Please call with questions. THanks. Marthenia Rolling, MSN-Ed, RN,BSN [facility] Liaison [phone]   Reason for consult:  Please assign to [facility] LCSW   Expected date of contact:  1-3 days (reserved for hospital discharges)   Diet - low sodium heart healthy    Complete by:  As directed    Discharge instructions    Complete by:  As directed    It was pleasure taking care of you. Please take your medicines as directed. Try to eat well and drink plenty of fluids. You can take MiraLAX daily if needed for your constipation. We make a follow-up appointment for you to be seen in clinic on May 1 at 3:45 PM.   Increase activity slowly    Complete by:  As directed       Signed: Lorella Nimrod, MD 09/11/2016, 2:34 PM   Pager: [phone]

## 2016-09-10 NOTE — Consult Note (Addendum)
   [facility] Inpatient Consult   09/10/2016  [name] [birth year] [id number]    Spoke with Mr. Gaymon at bedside as he is well known to writer and [place] Management. Discussed with Mr. Curran how there have multiple attempts to reach him by [place] [facility] team members. Mr. Esbenshade, laughed and said " I know, I know". Discussed that since he was unable to be reached, his case was closed. Please see chart review then notes for further patient outreach details by [facility] team.  Asked Mr. Zapf if he felt like he needed [place] Management program services. He states "yes, I need you guys. I promise I will be available from this day forward when you call".   Mr. Cordner endorses he lives alone. Sometimes has issues with affording medications. States he has a CAPS application at home on the table and has started to fill it out but needs someone to check to be sure he is filling the CAPS application out correctly. He still drives. States he is on the waiting list for meals on wheels. States he has had [employer] in the past. Also states he needs more coupons for ensure.   Emphasized to Mr. Enberg that since he has these follow up needs, writer will make referral for Community [facility] RNCM to contact him initially. After Community [facility] RNCM makes contact with Mr. Woodrick and he engages with her post discharge, [facility] Community RNCM will then make the necessary referrals to [facility] LCSW and [facility] Pharmacist if needed. Writer will not make all 3 referrals at this time since Mr. Longoria has not engaged with the multiple [facility] disciplines in the recent past. Mr. Walgren is agreeable to this plan.  Spoke with recent [place] to make aware of bedside discussion with Mr. Enslin.   Written consent obtained and [facility] Care Management packet provided with contact information.  Made inpatient RNCM aware. Will make referral for Community [facility] RNCM to be re-assigned.   Confirmed best contact number as  [phone]. Confirmed Primary Care Provider is Dr. [name].   Marthenia Rolling, MSN-Ed, RN,BSN [facility] Liaison [phone]

## 2016-09-10 NOTE — Progress Notes (Signed)
   Subjective: Patient was feeling better when seen this morning. Stating that since he never took Remeron last night he is feeling much more alert and better. He was eating his breakfast-and according to him he did eat his dinner very well. He did had a episode of hypoglycemia with CBG of 47 early today, patient remained asymptomatic-was given some orange juice and his blood sugar becomes 78 on subsequent check. He also states that his constipation did improve with the help of suppository but he just had a few small bowel movements-he was told that he can ask for another suppository before he leaves the hospital today.  Objective:  Vital signs in last 24 hours: Vitals:   09/09/16 1442 09/09/16 1933 09/09/16 2341 09/10/16 0457  BP: 98/76 92/69 107/71 120/82  Pulse: (!) 130 (!) 134 97 98  Resp:  18  18  Temp:  98.6 F (37 C)  98.1 F (36.7 C)  TempSrc:  Oral  Oral  SpO2:  100%  99%  Weight:    128 lb 1.6 oz (58.1 kg)  Height:       Gen.Cachectic and emaciated man,in no acute distress. Lungs.Clear bilaterally, no added sound. CV.RRR,no murmur/gallop or rub. Abdomen.Soft, nontender,nondistended,bowel sounds positive. Extremities.No edema, no cyanosis. Pulses 2+ bilaterally.  Labs. ACTH. 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  Acute on chronic failure to thrive. His ACTH levels with her lower normal limits. Cosyntropin test was negative for any adrenal insufficiency. Patient was feeling little better after stopping Remeron and starting him on Wellbutrin for depression. We will also discontinue his Protonix as that can cause failure to thrive-we are asked patient  that he if he start getting GERD symptoms, he should let his PCP know. -He is being discharged today, with a close follow-up at clinic.  A. fib with tachycardia bradycardia syndrome. Tachycardia improved this morning, patient remained asymptomatic. -Continue Apixaban 2.5 mg BID -Continue aspirin and Rosuvastatin daily.  CKD.Creatinine at baseline. His kidney functions can be monitored as an outpatient.  Constipation. Improving with the help of MiraLAX and suppository. He will be discharged home with scheduled MiraLAX as Percocet and decreased by mouth intake can be contributory. He was advised to drink plenty of fluids and try to avoid pain medicines when possible.  Dispo: Being discharged today.   Lorella Nimrod, MD 09/10/2016, 10:40 AM Pager: [phone]

## 2016-09-10 NOTE — Progress Notes (Signed)
Pt has orders to be discharged. Discharge instructions given and pt has no additional questions at this time. Medication regimen reviewed and pt educated. Pt verbalized understanding and has no additional questions. Telemetry box removed. IV removed and site in good condition. Pt stable and waiting for transportation.  Vanette Noguchi RN 

## 2016-09-10 NOTE — Progress Notes (Signed)
PT Cancellation Note  Patient Details Name: [name] MRN: [id number] DOB: [birth year]   Cancelled Treatment:    Reason Eval/Treat Not Completed: Patient declined, no reason specified Pt asked if PT can return around 11-11:30. Pt finishing breakfast. Pt reported having all equipment needs at home. PT will check on pt later as time allows.    [place] April, PTA Pager: [phone]   09/10/2016, 8:49 AM

## 2016-09-10 NOTE — Progress Notes (Signed)
Pt. With CBG of 47 this am. Carbs given. CBG now 78. Pt. Alert and stable. Will continue to monitor.

## 2016-09-10 NOTE — Care Management Important Message (Signed)
Important Message  Patient Details  Name: [name] MRN: [id number] Date of Birth: August 04, 1945   Medicare Important Message Given:  Yes    Orbie Pyo 09/10/2016, 3:05 PM

## 2016-09-10 NOTE — Progress Notes (Signed)
PT Cancellation Note  Patient Details Name: [name] MRN: [id number] DOB: Jul 01, 1945   Cancelled Treatment:    Reason Eval/Treat Not Completed: Patient declined, no reason specified Pt declined mobility and reported "I will be able to manage at home". Possible d/c today per pt. PT will continue to follow acutely.    [place] April, PTA Pager: [phone]   09/10/2016, 11:31 AM

## 2016-09-11 ENCOUNTER — Other Ambulatory Visit: Payer: Self-pay | Admitting: *Deleted

## 2016-09-11 NOTE — Patient Outreach (Signed)
[facility]) Care Management  09/11/2016  BODI PALMERI 04-09-1946 [id number]   Referral received from hospital liaison for [facility] involvement, transition of care program.  He has been active in the past, discharged from services due to inability to maintain contact.  He was recently admitted with failure to thrive, discharged yesterday 4/24.  Call placed to member, identity verified.  This care manager re-introduced self and stated purpose of call.  He state that he is ready to cooperate and be involved with [facility] at this time.  He report that he is not able to afford medications, aware that referral to pharmacy and social worker have been placed.  He agrees to home visit tomorrow, will complete transition of care and initial assessment at that time.  He denies any urgent needs/concerns.  [name], [place], MSN [place] [phone]

## 2016-09-12 ENCOUNTER — Other Ambulatory Visit: Payer: Self-pay | Admitting: Pharmacist

## 2016-09-12 ENCOUNTER — Encounter: Payer: Self-pay | Admitting: *Deleted

## 2016-09-12 ENCOUNTER — Other Ambulatory Visit: Payer: Self-pay | Admitting: *Deleted

## 2016-09-12 ENCOUNTER — Encounter: Payer: Self-pay | Admitting: Pharmacist

## 2016-09-12 NOTE — Patient Outreach (Addendum)
[facility]) Care Management  [place]   09/12/2016  [name] 27-Mar-1946 [id number]  Subjective: Patient was called regarding medication affordability post discharge per referral from [place] Nurse Liaison, Marthenia Rolling.  HIPAA identifiers were obtained.  Patient is a 71 year old male with multiple medical conditions including but not limited to:  Afib, CHF, COPD, chronic pain, and depression.  Hospitalized 4/18 for failure to thrive.  Patient reported [facility] but he did not have the money to pay for them.  Other than affordability, he said he was able to manage his medications.  Objective:   Encounter Medications: Outpatient Encounter Prescriptions as of 09/12/2016  Medication Sig Note  . albuterol (VENTOLIN HFA) 108 (90 BASE) MCG/ACT inhaler Inhale 2 puffs into the lungs every 6 (six) hours as needed for wheezing or shortness of breath.   [name] apixaban (ELIQUIS) 2.5 MG TABS tablet Take 1 tablet (2.5 mg total) by mouth 2 (two) times daily.   [name] aspirin 81 MG chewable tablet Chew 1 tablet (81 mg total) by mouth daily.   [name] buPROPion (WELLBUTRIN SR) 100 MG 12 hr tablet Take 1 tablet (100 mg total) by mouth 2 (two) times daily. 09/12/2016: Patient ran out of 100mg  so was taking 75mg --will pick up 100mg  today  . dronabinol (MARINOL) 5 MG capsule Take 1 capsule (5 mg total) by mouth 2 (two) times daily before lunch and supper.   . feeding supplement (BOOST / RESOURCE BREEZE) LIQD Take 1 Container by mouth 3 (three) times daily between meals. 09/12/2016: Uses both Boost and Ensure when he can afford it  . metoprolol succinate (TOPROL-XL) 50 MG 24 hr tablet Take 1 tablet (50 mg total) by mouth 2 (two) times daily. Take with or immediately following a meal.   . oxyCODONE-acetaminophen (PERCOCET) 10-325 MG tablet Take 1 tablet by mouth every 4 (four) hours as needed for pain. Do not take more than 5 tablets in 24 hours.   .  polyethylene glycol (MIRALAX / GLYCOLAX) packet Take 17 g by mouth daily. 09/12/2016: Patient will pick this up today  . rosuvastatin (CRESTOR) 20 MG tablet Take 1 tablet (20 mg total) by mouth daily at 6 PM.   . tiotropium (SPIRIVA) 18 MCG inhalation capsule Place 1 capsule (18 mcg total) into inhaler and inhale daily.    No facility-administered encounter medications on file as of 09/12/2016.     Functional Status: In your present state of health, do you have any difficulty performing the following activities: 09/12/2016 09/05/2016  Hearing? N N  Vision? N N  Difficulty concentrating or making decisions? Y N  Walking or climbing stairs? Y Y  Dressing or bathing? Y N  Doing errands, shopping? N N  Preparing Food and eating ? Y -  Using the Toilet? Y -  In the past six months, have you accidently leaked urine? Y -  Do you have problems with loss of bowel control? Y -  Managing your Medications? N -  Managing your Finances? N -  Housekeeping or managing your Housekeeping? Y -  Some recent data might be hidden    Fall/Depression Screening: Fall Risk  09/12/2016 07/17/2016 06/25/2016  Falls in the past year? Yes Yes Yes  Number falls in past yr: 1 2 or more 2 or more  Injury with Fall? No No No  Risk Factor Category  - High Fall Risk High Fall Risk  Risk for fall  due to : History of fall(s);Impaired balance/gait History of fall(s);Impaired balance/gait History of fall(s);Impaired balance/gait;Impaired mobility  Risk for fall due to (comments): - - -  Follow up Education provided;Falls prevention discussed Falls prevention discussed;Education provided Education provided;Falls prevention discussed   PHQ 2/9 Scores 09/12/2016 07/17/2016 06/25/2016 06/04/2016 05/29/2016 05/22/2016 05/15/2016  PHQ - 2 Score 4 2 2 2 2  0 2  PHQ- 9 Score 13 7 12 15 13  - 12  Exception Documentation - - - - - - -      Assessment: Patient's medications were reconciled via telephone by comparing the medication list in  the chart, [facility], the most recent discharge summary and patient interview.  Drugs sorted by system:  Neurologic/Psychologic: Bupropion  Cardiovascular: Aspirin(patient said he sometimes does not take if he cannot afford to purchase) Eliquis Metoprolol Rosuvastatin  Pulmonary/Allergy: Albuterol Spiriva  Gastrointestinal: Polyethylene Glycol (waiting at pharmacy for pick up) Dronabinol Boost or Ensure  Pain: Oxycodone/Acetaminophen    Medication Review Findings: Adherence: Aspirin-patient said he sometimes does not take aspirin if he does not have it.  Since he has [employer], patient is eligible to order OTC medications from the [place] order pharmacy at no charge.  ([employer] will need to be called to determine the actual amount the patient can order per his benefit)  Medication Assistance Patient is dually eligible (Medicaid and Medicare).  He is covered under [employer] Gold Plus.    [facility]'s [facility].  They had three prescriptions ready to be picked up:  Metoprolol 50mg , Polyethylene Glycol and Bupropion.  [facility] reported each medication had a copay of $3.35 or a total of $10.05.  Deanne Coffer, PharmD [facility] Surveyor, quantity), was contacted on patient's behalf as well to look into using [facility] funding for the patient's medications.  Dawn agreed to purchase the patient's medications with [facility] funding.  Patient was informed his medications would be covered on a one time basis and that he will need to complete a financial assessment with a [facility] Education officer, museum.  Patient's medication will be eligible for pick up after 5pm today.  Patient demonstrated understanding. He was very thankful and said he would pick up his medications today.    Plan:  Route note to patient's PCP  Follow up with patient after his financial assessment  Elayne Guerin, PharmD, [place] Clinical Pharmacist [phone]

## 2016-09-12 NOTE — Patient Outreach (Signed)
[facility]) Care Management   09/12/2016  BERND CROM 1945-09-11 [id number]  ARDELL MAKAREWICZ is an 71 y.o. male  Subjective:   Member complains of generalized pain, 8-10.  Report he took pain meds just before this care manager arrived, no relief yet but state that it usually does work.  He reports compliance with the medications he has, but there has been dosage changes since his discharge in which he has not been able to obtain.  He inquires about financial assistance, aware that pharmacy referral has been placed and to expect call.  Objective:   Review of Systems  Constitutional: Positive for weight loss.  HENT: Negative.   Eyes: Negative.   Respiratory: Negative.   Cardiovascular: Negative.   Gastrointestinal: Negative.   Genitourinary: Negative.   Skin: Negative.   Neurological: Positive for weakness.  Endo/Heme/Allergies: Negative.   Psychiatric/Behavioral: Positive for depression.    Physical Exam  Constitutional: He is oriented to person, place, and time.  Neck: Normal range of motion.  Cardiovascular:  Irregular, Atrial fibrillation  Respiratory: Effort normal and breath sounds normal.  GI: Soft. Bowel sounds are normal.  Musculoskeletal: Normal range of motion.  Neurological: He is alert and oriented to person, place, and time.  Skin: Skin is warm and dry.   BP (!) 96/58 (BP Location: Right Arm, Patient Position: Sitting, Cuff Size: Normal)   Pulse (!) 118   Resp 18   Ht 1.93 m (_0 )   Wt 128 lb (58.1 kg)   SpO2 99%   BMI 15.58 kg/m   Encounter Medications:   Outpatient Encounter Prescriptions as of 09/12/2016  Medication Sig Note  . albuterol (VENTOLIN HFA) 108 (90 BASE) MCG/ACT inhaler Inhale 2 puffs into the lungs every 6 (six) hours as needed for wheezing or shortness of breath. (Patient not taking: Reported on 09/04/2016) 09/04/2016: Pt has not taking the majority of his medications for over a month, he said "Im just tired of taking  all of them".  [name] apixaban (ELIQUIS) 2.5 MG TABS tablet Take 1 tablet (2.5 mg total) by mouth 2 (two) times daily.   [name] aspirin 81 MG chewable tablet Chew 1 tablet (81 mg total) by mouth daily. (Patient not taking: Reported on 09/04/2016)   . buPROPion (WELLBUTRIN SR) 100 MG 12 hr tablet Take 1 tablet (100 mg total) by mouth 2 (two) times daily.   [name] dronabinol (MARINOL) 5 MG capsule Take 1 capsule (5 mg total) by mouth 2 (two) times daily before lunch and supper.   . feeding supplement (BOOST / RESOURCE BREEZE) LIQD Take 1 Container by mouth 3 (three) times daily between meals.   . metoprolol succinate (TOPROL-XL) 50 MG 24 hr tablet Take 1 tablet (50 mg total) by mouth 2 (two) times daily. Take with or immediately following a meal.   . oxyCODONE-acetaminophen (PERCOCET) 10-325 MG tablet Take 1 tablet by mouth every 4 (four) hours as needed for pain. Do not take more than 5 tablets in 24 hours.   . polyethylene glycol (MIRALAX / GLYCOLAX) packet Take 17 g by mouth daily.   . rosuvastatin (CRESTOR) 20 MG tablet Take 1 tablet (20 mg total) by mouth daily at 6 PM. (Patient not taking: Reported on 09/04/2016)   . tiotropium (SPIRIVA) 18 MCG inhalation capsule Place 1 capsule (18 mcg total) into inhaler and inhale daily. (Patient not taking: Reported on 09/04/2016)    No facility-administered encounter medications on file as of 09/12/2016.     Functional  Status:   In your present state of health, do you have any difficulty performing the following activities: 09/12/2016 09/05/2016  Hearing? N N  Vision? N N  Difficulty concentrating or making decisions? Y N  Walking or climbing stairs? Y Y  Dressing or bathing? Y N  Doing errands, shopping? N N  Preparing Food and eating ? Y -  Using the Toilet? Y -  In the past six months, have you accidently leaked urine? Y -  Do you have problems with loss of bowel control? Y -  Managing your Medications? N -  Managing your Finances? N -  Housekeeping or managing  your Housekeeping? Y -  Some recent data might be hidden    Fall/Depression Screening:    PHQ 2/9 Scores 09/12/2016 07/17/2016 06/25/2016 06/04/2016 05/29/2016 05/22/2016 05/15/2016  PHQ - 2 Score _0 0 2  PHQ- 9 Score _1 - 12  Exception Documentation - - - - - - -   Fall Risk  09/12/2016 07/17/2016 06/25/2016 06/04/2016 05/29/2016  Falls in the past year? _2   Number falls in past yr: 1 2 or more 2 or more 1 2 or more  Injury with Fall? _3   Risk Factor Category  - High Fall Risk High Fall Risk High Fall Risk -  Risk for fall due to : History of fall(s);Impaired balance/gait History of fall(s);Impaired balance/gait History of fall(s);Impaired balance/gait;Impaired mobility Impaired balance/gait;Impaired mobility;History of fall(s) -  Risk for fall due to (comments): - - - - -  Follow up Education provided;Falls prevention discussed Falls prevention discussed;Education provided Education provided;Falls prevention discussed Falls prevention discussed -    Assessment:    Met with member at scheduled time.  He is familiar with [facility] services as he has been active in the past.  He continues to report that his main concerns are to have a personal care aide in the home for assistance as well as food assistance.  He has the CAPS application that is incomplete, made aware that LCSW referral has been placed and will assist with completing and submitting application.  He report he is already on the list for meals on wheels.  He has [employer], call placed to them, requested to have post-hospital meals sent.  He will receive 10 meals delivered between Tuesday and Thursday of next week.    Patient was recently discharged from hospital and all medications have been reviewed.  He has all medications per discharge instructions with the exception of Metoprolol 50 mg and Bupropion 100 mg.  He is currently taking 75 mg of Bupropion and cutting his Metoprolol 100 mg in half.   He state he is unable to afford correct doses at the pharmacy.  Advised again to expect call from [facility] pharmacist.    Discussed with member his diagnosis of failure to thrive and plan.  Per primary MD note of last office visit, palliative care and PACE were both mentioned.  These options were previously discussed with member, discussed again today.  He is aware that he is unable to be involved with the programs due to his continued use of marijuana.  He does confirm today that he continues to use.  He is made aware that he will need to stop if he would like to be involved, expresses no desire to quit at this time.    He report that he does monitor his weight and blood pressure/heart  rate 2-3 times a week.  He does drink nutrition supplements when he is able to afford them.  He denies any concerns at this time, advised to contact with any questions.   Plan:   Will follow up next week with weekly transition of care call.  [facility] CM Care Plan Problem One     Most Recent Value  Care Plan Problem One  Risk for readmission related to depression/failure to thrive as evidenced by recent hospitalization  Role Documenting the Problem One  Care Management [place] for Problem One  Active  [facility] Long Term Goal (31-90 days)  Pt will not have any hospitalizations within the next 31 days  [facility]N Long Term Goal Start Date  09/12/16  Interventions for Problem One Long Term Goal  Discussed with member the importance of following discharge instructions, including follow up appointments, medications, diet, to decrease the risk of readmission  [facility]N CM Short Term Goal #1 (0-30 days)  Member will take all medications as prescribed over the next 4 weeks  [facility]N CM Short Term Goal #1 Start Date  09/12/16  Interventions for Short Term Goal #1  Medications reviewed, educated on importance of taking as instructed, particularly ones for depression in effort to increase mood and appetite  THN CM Short Term Goal #2 (0-30  days)  Patient will keep and attend follow up appointment with primary MD within the next week  THN CM Short Term Goal #2 Start Date  09/12/16  Interventions for Short Term Goal #2  Discussed with member the importance of follow up with PCP in effort to decrease risk of readmisison.  Transportation confirmed with member.       [name], [place], MSN [place] [phone]

## 2016-09-13 DIAGNOSIS — J449 Chronic obstructive pulmonary disease, unspecified: Secondary | ICD-10-CM | POA: Diagnosis not present

## 2016-09-13 DIAGNOSIS — M6281 Muscle weakness (generalized): Secondary | ICD-10-CM | POA: Diagnosis not present

## 2016-09-13 DIAGNOSIS — R2681 Unsteadiness on feet: Secondary | ICD-10-CM | POA: Diagnosis not present

## 2016-09-13 DIAGNOSIS — J441 Chronic obstructive pulmonary disease with (acute) exacerbation: Secondary | ICD-10-CM | POA: Diagnosis not present

## 2016-09-13 DIAGNOSIS — N19 Unspecified kidney failure: Secondary | ICD-10-CM | POA: Diagnosis not present

## 2016-09-13 DIAGNOSIS — I5022 Chronic systolic (congestive) heart failure: Secondary | ICD-10-CM | POA: Diagnosis not present

## 2016-09-13 DIAGNOSIS — I4891 Unspecified atrial fibrillation: Secondary | ICD-10-CM | POA: Diagnosis not present

## 2016-09-13 DIAGNOSIS — J129 Viral pneumonia, unspecified: Secondary | ICD-10-CM | POA: Diagnosis not present

## 2016-09-16 ENCOUNTER — Telehealth: Payer: Self-pay | Admitting: Internal Medicine

## 2016-09-16 NOTE — Telephone Encounter (Signed)
APT. REMINDER CALL, LMTCB °

## 2016-09-17 ENCOUNTER — Ambulatory Visit (INDEPENDENT_AMBULATORY_CARE_PROVIDER_SITE_OTHER): Payer: Medicare HMO | Admitting: Internal Medicine

## 2016-09-17 ENCOUNTER — Encounter: Payer: Self-pay | Admitting: Internal Medicine

## 2016-09-17 ENCOUNTER — Other Ambulatory Visit: Payer: Self-pay | Admitting: *Deleted

## 2016-09-17 ENCOUNTER — Encounter: Payer: Self-pay | Admitting: *Deleted

## 2016-09-17 VITALS — BP 107/78 | HR 128 | Temp 98.3°F | Ht 75.0 in | Wt 132.1 lb

## 2016-09-17 DIAGNOSIS — Z5189 Encounter for other specified aftercare: Secondary | ICD-10-CM

## 2016-09-17 DIAGNOSIS — R627 Adult failure to thrive: Secondary | ICD-10-CM

## 2016-09-17 DIAGNOSIS — R000 Tachycardia, unspecified: Secondary | ICD-10-CM | POA: Diagnosis not present

## 2016-09-17 MED ORDER — DRONABINOL 10 MG PO CAPS: 10.0000 mg | ORAL_CAPSULE | Freq: Two times a day (BID) | ORAL | 2 refills | Status: DC

## 2016-09-17 NOTE — Patient Outreach (Signed)
[facility]) Care Management  09/17/2016  [name] 1945-06-01 [id number]   Weekly transition of care call placed to member.  He report that he is doing "alright."  He denies any health concerns, only inquires about personal care services and food assistance.  He report compliance with medications, state that he has obtained the correct doses for his Wellbutrin and Metoprolol and is taking according to instructions.  This care manager was also notified that member triggered red on EMMI COPD dashboard for worsening breathing problems and using rescue inhaler 4 times in one day.  He report that there was "one day" where his breathing was "off" but he denies any shortness of breath or concerns regarding his breathing at this time.  He continues to smoke cigarettes and marijuana despite being cautioned against it, especially with oxygen in the home.  States "It's not on when I smoke."    He report that he is still waiting to be contacted by the LCSW, advised to answer phone when she calls.  He verbalizes understanding.  Will follow up next week with weekly transition of care call.  [name], [place], MSN [place] [phone]

## 2016-09-17 NOTE — Assessment & Plan Note (Signed)
Doing better. Appetite is getting better, weight is up 4 lb 132 lb today from 128 in the hospital.  Will increase Marinol dose to 10mg  BID from 5mg  BID.  Follow up in 1 month with PCP.

## 2016-09-17 NOTE — Patient Outreach (Signed)
[facility]) Care Management  09/17/2016  [name] [birth year] [id number]   CSW was able to make initial contact with patient today to perform phone assessment, as well as assess and assist with social work needs and services.  CSW introduced self, explained role and types of services provided through [place] [facility] ([place] [facility]).  Patient remembers having worked with [place] in the past.  CSW further explained to patient that CSW works with patient's RNCM, also with [place] Management, [name]. CSW then explained the reason for the call, indicating [name] thought that patient would benefit from social work services and resources to assist with obtaining [employer] Youth worker) and/or CAPS Medical illustrator), as well as [employer].  CSW obtained two HIPAA compliant identifiers from patient, which included patient's name and date of birth. CSW has explained to patient on several different occasions that he will not be eligible to receive PCS through [facility] or CAPS through the [place], as patient is not an Adult Medicaid recipient.  Patient has applied several times, but continues to be denied because he exceeds the [place] for fiscal year 2017/2018.  Patient has applied as recent as February of 2018, not only for Adult Medicaid, but also for [employer]. CSW offered to mail patient a list of private agency sitters, but patient admits that he is unable to afford any additional out-of-pocket expenses each month, declining the list.  Patient is aware that he is on the waiting list for Mobile Meals through [employer] [place], and that the list is pretty extensive.  Patient has been given the contact number to call and periodically check the status of his application. In the meantime, CSW agreed to make a referral for patient to  the [place] Program through patient's [employer] benefit.  The [facility] Well-Dine Program will enable patient to receive a 30 day supply of frozen meals, mailed directly to patient's home.  Patient was agreeable to receiving this service.  CSW then talked with patient about the score of his Depression Screening (PHQ-2 & 9), offering to provide counseling and supportive services, as well as refer patient for treatment.  Patient denied, indicating that he is already undergoing therapy and taking psychotropic medications, as prescribed.  CSW encouraged patient to notify CSW if he changes his mind.  CSW also spoke with patient about completing his Advanced Directives (Living Will and [place] documents), for which patient also declined at this time.  CSW will perform a case closure on patient, as all goals of treatment have been met from social work standpoint and no additional social work needs have been identified at this time.  CSW will notify patient's RNCM with [place] Management, [name] of CSW's plans to close patient's case.  CSW will fax an update to patient's Primary Care Physician, [name] to ensure that they are aware of CSW's involvement with patient's plan of care.  CSW will submit a case closure request to Verlon Setting, Care Management Assistant with [place] Management, in the form of an [employer].  CSW will ensure that Mrs. Comer is aware of Mrs. Lane's, RNCM with [place] [facility], continued involvement with patient's care. [name], BSW, MSW, LCSW  Licensed Education officer, environmental [facility] System  Mailing [place] [address] [place]  [address], [place], [place] [address] Physical Address-[address], [place], [place] [address] Toll Free Main # [phone] Fax # [phone] Cell # [phone]  Office #  [phone] [name]

## 2016-09-17 NOTE — Patient Instructions (Signed)
I am glad to see you.  We will increase you Marinol dose to 10mg  twice a day.  Come back and see your regular doctor in 1 month.

## 2016-09-17 NOTE — Progress Notes (Signed)
CC: hospital follow up   HPI:  [name] is a 71 y.o. with pmh as listed below is here for hospital follow up for failure to thrive  Past Medical History:  Diagnosis Date  . Alcohol abuse   . Anxiety   . Avascular necrosis of bones of both hips (HCC)    Status post bilateral hip replacements  . Bipolar disorder ([place])   . CHF (congestive heart failure) ([place])   . Chronic systolic heart failure ([place])    a. NICM - EF 35% with normal cors 2003. b. Last echo 11/2012 - EF 25-30%.  . CKD (chronic kidney disease)    Renal U/S 12/04/2009 showed no pathological findings. Labs 12/04/2009 include normal ESR, C3, C4; neg ANA; SPEP showed nonspecific increase in the alpha-2 region with no M-spike; UPEP showed no monoclonal free light chains; urine IFE showed polyclonal increase in feree Kappa and/or free Lambda light chains. Baseline Cr reported 1.7-2.5.  [name] COPD (chronic obstructive pulmonary disease) ([place])   . Depression   . DVT (deep venous thrombosis) ([place])    He has hypercoagulability with multiple prior DVTs  . E coli bacteremia   . Erectile dysfunction   . Failure to thrive (0-17) 08/2016   DEHYDRATION  . Gastritis   . GERD (gastroesophageal reflux disease)   . HCAP (healthcare-associated pneumonia) 08/24/2015  . Hydrocele, unspecified   . Hyperlipemia   . Hypertension   . 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  Thyroid scan 08/28/2009 was normal with no focal areas of abnormal increased or decreased activity seen; the uptake of I 131 sodium iodide at 24 hours was 5.7%.  TSH and free T4 normalized by 08/16/2009.  . Intestinal obstruction ([place])   . Lung nodule    Chest CT scan on 12/14/2008 showed a nodular opacity at the left lung base felt to most likely represent scarring.  Follow-up chest CT scan on 06/02/2010 showed parenchymal scarring in the left apex, left  lower lobe, and lingula; some of this scarring at the left base had a nodular  appearance, unchanged. Chest 09/2012 - stable.  . Noncompliance   . On home oxygen therapy    "3L periodically" (08/24/2015)  . Osteoarthritis   . Pleural plaque    H/o asbestos exposure. Chest CT on 06/02/2010 showed stable extensive calcified pleural plaques involving the left hemithorax, consistent with asbestos related pleural disease.  . Portal vein thrombosis   . Protein calorie malnutrition ([place]) 04/2016  . Spondylosis   . STEMI (ST elevation myocardial infarction) ([place]) 10/11/2014  . 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 b. St. Jude gen change 2010.  [name] Thrombocytopenia ([place])   . 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  . Weight loss    Normal colonoscopy by Dr. Olevia Perches on 11/06/2010.    Was admitted 4/18 to 4/24 for failure to thrive. Had workup in the hospital including ruling out AI. Was offered SNF placement but he declined. Discharge weight was 128 lb. Appetite improved slightly in the hospital. On Marinol 49m daily. Was asked to decrease opioid dose.   He is doing better, appetite is getting better. He is 132 lb today. Wants to go up on Marinol dosing from 535mbid. [facility] is helping him with meds. Cooking his own meal.   Review of Systems:   Review of Systems  Constitutional: Negative for chills and fever.  Cardiovascular: Negative for chest pain.  Genitourinary: Negative for dysuria.  Neurological: Negative for dizziness.     Physical Exam:  Vitals:   09/17/16 1614  BP: 107/78  Pulse: (!) 128  Temp: 98.3 F (36.8 C)  TempSrc: Oral  SpO2: 100%  Weight: 132 lb 1.6 oz (59.9 kg)  Height: '6\' 3"'  (1.905 m)   Physical Exam  Constitutional: He is oriented to person, place, and time.  Thin male. NAD.   HENT:  Head: Normocephalic and atraumatic.  Eyes: Conjunctivae are normal. Pupils are equal, round, and reactive to light.    Cardiovascular: Exam reveals no friction rub.   No murmur heard. Tachycardic, irregular.    Respiratory: Effort normal and breath sounds normal. No respiratory distress.  Musculoskeletal: Normal range of motion. He exhibits no edema.  Neurological: He is alert and oriented to person, place, and time.  Skin: Skin is warm.  Psychiatric: He has a normal mood and affect.    Assessment & Plan:   See Encounters Tab for problem based charting.  Patient discussed with Dr. Angelia Mould

## 2016-09-18 ENCOUNTER — Other Ambulatory Visit: Payer: Self-pay

## 2016-09-18 ENCOUNTER — Other Ambulatory Visit: Payer: Self-pay | Admitting: *Deleted

## 2016-09-18 DIAGNOSIS — N19 Unspecified kidney failure: Secondary | ICD-10-CM | POA: Diagnosis not present

## 2016-09-18 DIAGNOSIS — I4811 Longstanding persistent atrial fibrillation: Secondary | ICD-10-CM

## 2016-09-18 DIAGNOSIS — J129 Viral pneumonia, unspecified: Secondary | ICD-10-CM | POA: Diagnosis not present

## 2016-09-18 DIAGNOSIS — I4891 Unspecified atrial fibrillation: Secondary | ICD-10-CM | POA: Diagnosis not present

## 2016-09-18 DIAGNOSIS — J449 Chronic obstructive pulmonary disease, unspecified: Secondary | ICD-10-CM | POA: Diagnosis not present

## 2016-09-18 DIAGNOSIS — F1120 Opioid dependence, uncomplicated: Secondary | ICD-10-CM

## 2016-09-18 DIAGNOSIS — R627 Adult failure to thrive: Secondary | ICD-10-CM

## 2016-09-18 MED ORDER — OXYCODONE-ACETAMINOPHEN 10-325 MG PO TABS: 1.0000 | ORAL_TABLET | ORAL | 0 refills | Status: DC | PRN

## 2016-09-18 NOTE — Telephone Encounter (Signed)
Please call pt back about meds.  

## 2016-09-19 ENCOUNTER — Other Ambulatory Visit: Payer: Self-pay | Admitting: Pharmacist

## 2016-09-19 NOTE — Telephone Encounter (Signed)
Patient calling to see if his oxyCODONE-acetaminophen (PERCOCET) 10-325 MG tablet is ready to be picked up.

## 2016-09-19 NOTE — Patient Outreach (Signed)
[place] [facility]) Care Management  09/19/2016  DEZI SCHANER 20-Jul-1945 [id number]   Patient was called to follow up on patient assistance.  HIPAA identifiers were obtained.  Patient confirmed he picked up the medications that were paid for him by [facility] emergency funding.    Since it is the beginning of the month and the patient has just received his check, patient was encouraged to pick up any medications he needs as soon as possible to ensure he has the money to pay for them.  He said the most expensive medication for him is Eliquis.  Patient reported he is getting a 30 day supply of Eliquis for $8.35.  Since the patient has full Extra Help, his copay does not change if he gets a 30 day supply of 90 day supply.    As a savings to the patient, the provider's office was called and were asked to send a new prescription for a 90 day supply of Eliquis. The provider (They agreed)  [facility]  [facility] for a 90 day supply on 09/18/16 and the cost was $8.35.  Patient confirmed he will go and pick up his medications within the next two days.  Plan:  1.  Close pharmacy case as the patient has Full Extra Help and his medications will now all be filled as 90 day supplies which will help with affordability.  2. Patient has my telephone number for any future medication related concerns.  Elayne Guerin, PharmD, [place] Clinical Pharmacist [phone]

## 2016-09-19 NOTE — Telephone Encounter (Signed)
Informed pt and made next appt w/ pcp

## 2016-09-19 NOTE — Telephone Encounter (Signed)
Call from [facility] pharmacist - states pt does not have money to pay for his medications and [facility] has been paying. Requesting 90 days supply on his medications (except Oxycodone) will be cheaper. Need new rxs. Thanks

## 2016-09-19 NOTE — Telephone Encounter (Signed)
Want to know if pain med is ready. Please call back.

## 2016-09-20 MED ORDER — APIXABAN 2.5 MG PO TABS: 2.5000 mg | ORAL_TABLET | Freq: Two times a day (BID) | ORAL | 1 refills | Status: DC

## 2016-09-20 MED ORDER — DRONABINOL 10 MG PO CAPS: 10.0000 mg | ORAL_CAPSULE | Freq: Two times a day (BID) | ORAL | 1 refills | Status: DC

## 2016-09-20 MED ORDER — BUPROPION HCL ER (SR) 100 MG PO TB12: 100.0000 mg | ORAL_TABLET | Freq: Two times a day (BID) | ORAL | 0 refills | Status: DC

## 2016-09-20 MED ORDER — POLYETHYLENE GLYCOL 3350 17 G PO PACK: 17.0000 g | PACK | Freq: Every day | ORAL | 0 refills | Status: DC

## 2016-09-20 NOTE — Telephone Encounter (Signed)
[facility]inol [facility].

## 2016-09-20 NOTE — Telephone Encounter (Signed)
Requesting 90 days supply on his medications (except Oxycodone) will be cheaper. Need new rxs. Thanks

## 2016-09-20 NOTE — Patient Outreach (Signed)
[place] [facility]) Care Management  09/20/2016  [name] [birth year] [id number]   Request received from Deitra Mayo, LCSW to refer patient to Care Transitions/Mobile Meals Delivery. Referral made on 09/20/2016. Meals will begin on 09/23/2016. Programmer, multimedia will notify patient.

## 2016-09-23 ENCOUNTER — Other Ambulatory Visit: Payer: Self-pay | Admitting: *Deleted

## 2016-09-23 ENCOUNTER — Encounter: Payer: Self-pay | Admitting: *Deleted

## 2016-09-23 NOTE — Progress Notes (Signed)
[facility] Attending  Case discussed with Dr. Ahmed at the time of the visit.  We reviewed the resident's history and exam and pertinent patient test results.  I agree with the assessment, diagnosis, and plan of care documented in the resident's note. 

## 2016-09-23 NOTE — Patient Outreach (Signed)
[place] [facility]) Care Management  09/23/2016  [name] [birth year] [id number]   Collaborated with leadership regarding member's current nutrition status as member is failure to thrive.  He will receive the allotted meals with [employer] Well Dine program according to his insurance plan.  [facility] will also provide member with member with Ensure and Mobile Meals (in addition to the [facility] meal plan) for the next 30 days.  Call placed to member to discuss plan, no answer, HIPAA compliant voice message left.    Update @ 1630:  Call received back from member.  He report he is doing "pretty good."  Informed of the plan for [facility] to provide Mobile Meals and Ensure.  He state that he was contacted by someone to set up the Mobile Meals today.  He request to have Ensure Plus strawberry flavor ordered, made aware that this care manager will contact him once the order has arrived to schedule home visit for delivery.  He verbalizes understanding and expressed gratitude for the assistance.  Confirms that follow up appointment with primary MD was attended, Marinol was increased in effort to continue to increase appetite.  He report that he has been taking his medications as prescribed, denies questions/concerns at this time.  Will follow up next week with weekly transition of care call, will schedule home visit once product has arrived.  [name], [place], MSN [place] [phone]

## 2016-09-26 ENCOUNTER — Other Ambulatory Visit: Payer: Self-pay | Admitting: *Deleted

## 2016-09-26 NOTE — Patient Outreach (Addendum)
[place] [facility]) Care Management  09/26/2016  [name] 1946/03/31 [id number]   Call placed to member to inform him that Ensure was delivered to office.  This care manager will pick up today and make attempt to deliver this afternoon.  He is made aware that if unable to deliver today, will deliver no later than Monday.  He verbalizes understanding.    Update @ 1530:  Arrived at [facility] home to deliver Ensure.  He report he has been taking medication as prescribed, including increase in Marinol, denies concern.  He state his weight today was 136 pounds, increase from 128 on discharge.  He is encouraged to continue to weigh on a regular basis and document as well as check blood pressure/heart rate.  He verbalizes understanding.  Will follow up next week with transition of care call, follow up within the next 2 weeks with routine home visit.   [name], [place], MSN [name] [phone]

## 2016-10-02 ENCOUNTER — Other Ambulatory Visit: Payer: Self-pay | Admitting: *Deleted

## 2016-10-02 NOTE — Patient Outreach (Signed)
[place] [facility]) Care Management  10/02/2016  KAMARII CARTON 1945-12-24 [id number]   Weekly transition of care call placed to member.  He report some "dizzy spells" over the last few days stating that he has not been out of the home since Sunday because of them.  He state he has been checking his blood pressure, 80s/60s yesterday.  He is advised to take today with the care manager on phone, 80/68 with heart rate of 136.  He report some dizziness today, report some shortness of breath and chest palpitations over the last couple days, none today.  He report compliance with medications, including Metoprolol 50 mg.  He state his weight has been consistent, remaining at a 6 pound gain since discharge.  He denies any other concerns today.    Call then placed to H. Buena Irish at [facility] to report member's reported vital signs and symptoms.  She will follow up with member directly regarding evaluation.  Will follow up with member within the next week.  [name], [place], MSN [place] [phone]

## 2016-10-08 ENCOUNTER — Inpatient Hospital Stay (HOSPITAL_COMMUNITY)
Admission: EM | Admit: 2016-10-08 | Discharge: 2016-10-14 | DRG: 312 | Disposition: A | Discharge status: Home / Self Care | Payer: Medicare HMO | Attending: Oncology | Admitting: Oncology

## 2016-10-08 ENCOUNTER — Encounter (HOSPITAL_COMMUNITY): Payer: Self-pay | Admitting: Emergency Medicine

## 2016-10-08 ENCOUNTER — Other Ambulatory Visit: Payer: Self-pay

## 2016-10-08 ENCOUNTER — Telehealth: Payer: Self-pay | Admitting: *Deleted

## 2016-10-08 ENCOUNTER — Emergency Department (HOSPITAL_COMMUNITY): Payer: Medicare HMO

## 2016-10-08 ENCOUNTER — Other Ambulatory Visit: Payer: Self-pay | Admitting: *Deleted

## 2016-10-08 DIAGNOSIS — Z7982 Long term (current) use of aspirin: Secondary | ICD-10-CM

## 2016-10-08 DIAGNOSIS — I5022 Chronic systolic (congestive) heart failure: Secondary | ICD-10-CM | POA: Diagnosis not present

## 2016-10-08 DIAGNOSIS — Z950 Presence of cardiac pacemaker: Secondary | ICD-10-CM | POA: Diagnosis not present

## 2016-10-08 DIAGNOSIS — R112 Nausea with vomiting, unspecified: Secondary | ICD-10-CM | POA: Diagnosis present

## 2016-10-08 DIAGNOSIS — E785 Hyperlipidemia, unspecified: Secondary | ICD-10-CM | POA: Diagnosis present

## 2016-10-08 DIAGNOSIS — Z880 Allergy status to penicillin: Secondary | ICD-10-CM

## 2016-10-08 DIAGNOSIS — K5909 Other constipation: Secondary | ICD-10-CM | POA: Diagnosis present

## 2016-10-08 DIAGNOSIS — E0590 Thyrotoxicosis, unspecified without thyrotoxic crisis or storm: Secondary | ICD-10-CM | POA: Diagnosis not present

## 2016-10-08 DIAGNOSIS — N19 Unspecified kidney failure: Secondary | ICD-10-CM | POA: Diagnosis not present

## 2016-10-08 DIAGNOSIS — G8929 Other chronic pain: Secondary | ICD-10-CM | POA: Diagnosis not present

## 2016-10-08 DIAGNOSIS — N3090 Cystitis, unspecified without hematuria: Secondary | ICD-10-CM | POA: Diagnosis not present

## 2016-10-08 DIAGNOSIS — M6281 Muscle weakness (generalized): Secondary | ICD-10-CM | POA: Diagnosis not present

## 2016-10-08 DIAGNOSIS — I252 Old myocardial infarction: Secondary | ICD-10-CM | POA: Diagnosis not present

## 2016-10-08 DIAGNOSIS — I5042 Chronic combined systolic (congestive) and diastolic (congestive) heart failure: Secondary | ICD-10-CM | POA: Diagnosis present

## 2016-10-08 DIAGNOSIS — J441 Chronic obstructive pulmonary disease with (acute) exacerbation: Secondary | ICD-10-CM | POA: Diagnosis not present

## 2016-10-08 DIAGNOSIS — R2681 Unsteadiness on feet: Secondary | ICD-10-CM | POA: Diagnosis not present

## 2016-10-08 DIAGNOSIS — N183 Chronic kidney disease, stage 3 (moderate): Secondary | ICD-10-CM | POA: Diagnosis present

## 2016-10-08 DIAGNOSIS — Z86718 Personal history of other venous thrombosis and embolism: Secondary | ICD-10-CM | POA: Diagnosis not present

## 2016-10-08 DIAGNOSIS — I130 Hypertensive heart and chronic kidney disease with heart failure and stage 1 through stage 4 chronic kidney disease, or unspecified chronic kidney disease: Secondary | ICD-10-CM | POA: Diagnosis not present

## 2016-10-08 DIAGNOSIS — R42 Dizziness and giddiness: Secondary | ICD-10-CM | POA: Diagnosis not present

## 2016-10-08 DIAGNOSIS — Z7901 Long term (current) use of anticoagulants: Secondary | ICD-10-CM

## 2016-10-08 DIAGNOSIS — I4891 Unspecified atrial fibrillation: Secondary | ICD-10-CM | POA: Diagnosis not present

## 2016-10-08 DIAGNOSIS — I429 Cardiomyopathy, unspecified: Secondary | ICD-10-CM | POA: Diagnosis present

## 2016-10-08 DIAGNOSIS — R55 Syncope and collapse: Secondary | ICD-10-CM

## 2016-10-08 DIAGNOSIS — Z79899 Other long term (current) drug therapy: Secondary | ICD-10-CM

## 2016-10-08 DIAGNOSIS — I959 Hypotension, unspecified: Secondary | ICD-10-CM | POA: Diagnosis present

## 2016-10-08 DIAGNOSIS — N400 Enlarged prostate without lower urinary tract symptoms: Secondary | ICD-10-CM | POA: Diagnosis not present

## 2016-10-08 DIAGNOSIS — J129 Viral pneumonia, unspecified: Secondary | ICD-10-CM | POA: Diagnosis not present

## 2016-10-08 DIAGNOSIS — J449 Chronic obstructive pulmonary disease, unspecified: Secondary | ICD-10-CM | POA: Diagnosis present

## 2016-10-08 DIAGNOSIS — R404 Transient alteration of awareness: Secondary | ICD-10-CM | POA: Diagnosis not present

## 2016-10-08 DIAGNOSIS — I4892 Unspecified atrial flutter: Secondary | ICD-10-CM | POA: Diagnosis not present

## 2016-10-08 DIAGNOSIS — I743 Embolism and thrombosis of arteries of the lower extremities: Secondary | ICD-10-CM

## 2016-10-08 DIAGNOSIS — I483 Typical atrial flutter: Secondary | ICD-10-CM | POA: Diagnosis not present

## 2016-10-08 DIAGNOSIS — R627 Adult failure to thrive: Secondary | ICD-10-CM | POA: Diagnosis not present

## 2016-10-08 DIAGNOSIS — N189 Chronic kidney disease, unspecified: Secondary | ICD-10-CM | POA: Diagnosis not present

## 2016-10-08 DIAGNOSIS — F329 Major depressive disorder, single episode, unspecified: Secondary | ICD-10-CM | POA: Diagnosis not present

## 2016-10-08 DIAGNOSIS — I495 Sick sinus syndrome: Secondary | ICD-10-CM | POA: Diagnosis not present

## 2016-10-08 DIAGNOSIS — I951 Orthostatic hypotension: Secondary | ICD-10-CM | POA: Diagnosis not present

## 2016-10-08 DIAGNOSIS — R000 Tachycardia, unspecified: Secondary | ICD-10-CM | POA: Diagnosis not present

## 2016-10-08 DIAGNOSIS — R5383 Other fatigue: Secondary | ICD-10-CM | POA: Diagnosis not present

## 2016-10-08 DIAGNOSIS — E43 Unspecified severe protein-calorie malnutrition: Secondary | ICD-10-CM | POA: Diagnosis not present

## 2016-10-08 DIAGNOSIS — M1990 Unspecified osteoarthritis, unspecified site: Secondary | ICD-10-CM | POA: Diagnosis not present

## 2016-10-08 LAB — URINALYSIS, ROUTINE W REFLEX MICROSCOPIC
Bilirubin Urine: NEGATIVE
Glucose, UA: NEGATIVE mg/dL
Hgb urine dipstick: NEGATIVE
Ketones, ur: NEGATIVE mg/dL
NITRITE: NEGATIVE
PH: 6 (ref 5.0–8.0)
Protein, ur: NEGATIVE mg/dL
SPECIFIC GRAVITY, URINE: 1.015 (ref 1.005–1.030)

## 2016-10-08 LAB — CBC WITH DIFFERENTIAL/PLATELET
BASOS ABS: 0 10*3/uL (ref 0.0–0.1)
Basophils Relative: 0 %
Eosinophils Absolute: 0.6 10*3/uL (ref 0.0–0.7)
Eosinophils Relative: 8 %
HEMATOCRIT: 44.5 % (ref 39.0–52.0)
Hemoglobin: 14.9 g/dL (ref 13.0–17.0)
LYMPHS PCT: 33 %
Lymphs Abs: 2.3 10*3/uL (ref 0.7–4.0)
MCH: 31.6 pg (ref 26.0–34.0)
MCHC: 33.5 g/dL (ref 30.0–36.0)
MCV: 94.3 fL (ref 78.0–100.0)
MONO ABS: 0.6 10*3/uL (ref 0.1–1.0)
MONOS PCT: 8 %
NEUTROS ABS: 3.6 10*3/uL (ref 1.7–7.7)
NEUTROS PCT: 51 %
Platelets: 120 10*3/uL — ABNORMAL LOW (ref 150–400)
RBC: 4.72 MIL/uL (ref 4.22–5.81)
RDW: 14.2 % (ref 11.5–15.5)
WBC: 7 10*3/uL (ref 4.0–10.5)

## 2016-10-08 LAB — COMPREHENSIVE METABOLIC PANEL
ALBUMIN: 3.6 g/dL (ref 3.5–5.0)
ALT: 48 U/L (ref 17–63)
AST: 44 U/L — AB (ref 15–41)
Alkaline Phosphatase: 63 U/L (ref 38–126)
Anion gap: 7 (ref 5–15)
BILIRUBIN TOTAL: 1.4 mg/dL — AB (ref 0.3–1.2)
BUN: 28 mg/dL — AB (ref 6–20)
CALCIUM: 8.9 mg/dL (ref 8.9–10.3)
CO2: 21 mmol/L — ABNORMAL LOW (ref 22–32)
Chloride: 112 mmol/L — ABNORMAL HIGH (ref 101–111)
Creatinine, Ser: 1.51 mg/dL — ABNORMAL HIGH (ref 0.61–1.24)
GFR calc Af Amer: 51 mL/min — ABNORMAL LOW (ref >60)
GFR calc non Af Amer: 44 mL/min — ABNORMAL LOW (ref >60)
GLUCOSE: 76 mg/dL (ref 65–99)
POTASSIUM: 4.9 mmol/L (ref 3.5–5.1)
Sodium: 140 mmol/L (ref 135–145)
TOTAL PROTEIN: 6.2 g/dL — AB (ref 6.5–8.1)

## 2016-10-08 LAB — I-STAT CG4 LACTIC ACID, ED: LACTIC ACID, VENOUS: 0.99 mmol/L (ref 0.5–1.9)

## 2016-10-08 LAB — BRAIN NATRIURETIC PEPTIDE: B NATRIURETIC PEPTIDE 5: 232.7 pg/mL — AB (ref 0.0–100.0)

## 2016-10-08 LAB — CBG MONITORING, ED: Glucose-Capillary: 77 mg/dL (ref 65–99)

## 2016-10-08 LAB — I-STAT TROPONIN, ED: TROPONIN I, POC: 0.02 ng/mL (ref 0.00–0.08)

## 2016-10-08 MED ORDER — OXYCODONE-ACETAMINOPHEN 10-325 MG PO TABS: 1.0000 | ORAL_TABLET | ORAL | Status: DC | PRN

## 2016-10-08 MED ORDER — METOPROLOL SUCCINATE ER 50 MG PO TB24
50.0000 mg | ORAL_TABLET | Freq: Two times a day (BID) | ORAL | Status: DC
  Administered 2016-10-08: 50 mg via ORAL
  Filled 2016-10-08 (×2): qty 1

## 2016-10-08 MED ORDER — OXYCODONE HCL 5 MG PO TABS
5.0000 mg | ORAL_TABLET | ORAL | Status: DC | PRN
  Administered 2016-10-09 – 2016-10-14 (×11): 5 mg via ORAL
  Filled 2016-10-08 (×11): qty 1

## 2016-10-08 MED ORDER — TIOTROPIUM BROMIDE MONOHYDRATE 18 MCG IN CAPS
18.0000 ug | ORAL_CAPSULE | Freq: Every day | RESPIRATORY_TRACT | Status: DC
  Administered 2016-10-09 – 2016-10-14 (×6): 18 ug via RESPIRATORY_TRACT
  Filled 2016-10-08 (×2): qty 5

## 2016-10-08 MED ORDER — BUPROPION HCL ER (SR) 100 MG PO TB12
100.0000 mg | ORAL_TABLET | Freq: Two times a day (BID) | ORAL | Status: DC
  Administered 2016-10-08 – 2016-10-14 (×13): 100 mg via ORAL
  Filled 2016-10-08 (×13): qty 1

## 2016-10-08 MED ORDER — BOOST / RESOURCE BREEZE PO LIQD
1.0000 | Freq: Three times a day (TID) | ORAL | Status: DC
  Administered 2016-10-08 – 2016-10-14 (×7): 1 via ORAL

## 2016-10-08 MED ORDER — ENSURE ENLIVE PO LIQD: 237.0000 mL | Freq: Three times a day (TID) | ORAL | Status: DC

## 2016-10-08 MED ORDER — SODIUM CHLORIDE 0.9 % IV SOLN
INTRAVENOUS | Status: DC
  Administered 2016-10-08 – 2016-10-09 (×2): via INTRAVENOUS

## 2016-10-08 MED ORDER — BOOST / RESOURCE BREEZE PO LIQD: 1.0000 | Freq: Three times a day (TID) | ORAL | Status: DC

## 2016-10-08 MED ORDER — APIXABAN 2.5 MG PO TABS
2.5000 mg | ORAL_TABLET | Freq: Two times a day (BID) | ORAL | Status: DC
  Administered 2016-10-08 – 2016-10-14 (×12): 2.5 mg via ORAL
  Filled 2016-10-08 (×12): qty 1

## 2016-10-08 MED ORDER — SODIUM CHLORIDE 0.9 % IV BOLUS (SEPSIS)
1000.0000 mL | Freq: Once | INTRAVENOUS | Status: AC
  Administered 2016-10-08: 1000 mL via INTRAVENOUS

## 2016-10-08 MED ORDER — POLYETHYLENE GLYCOL 3350 17 G PO PACK
17.0000 g | PACK | Freq: Every day | ORAL | Status: DC
  Administered 2016-10-14: 17 g via ORAL
  Filled 2016-10-08 (×5): qty 1

## 2016-10-08 MED ORDER — ASPIRIN 81 MG PO CHEW
81.0000 mg | CHEWABLE_TABLET | Freq: Every day | ORAL | Status: DC
  Administered 2016-10-09 – 2016-10-14 (×6): 81 mg via ORAL
  Filled 2016-10-08 (×6): qty 1

## 2016-10-08 MED ORDER — ALBUTEROL SULFATE (2.5 MG/3ML) 0.083% IN NEBU: 3.0000 mL | INHALATION_SOLUTION | Freq: Four times a day (QID) | RESPIRATORY_TRACT | Status: DC | PRN

## 2016-10-08 MED ORDER — METOPROLOL SUCCINATE ER 50 MG PO TB24
50.0000 mg | ORAL_TABLET | Freq: Every day | ORAL | Status: DC
  Administered 2016-10-08 – 2016-10-11 (×3): 50 mg via ORAL
  Filled 2016-10-08 (×5): qty 1

## 2016-10-08 MED ORDER — DEXTROSE 5 % IV SOLN
1.0000 g | INTRAVENOUS | Status: DC
  Administered 2016-10-08 – 2016-10-09 (×2): 1 g via INTRAVENOUS
  Filled 2016-10-08 (×2): qty 10

## 2016-10-08 MED ORDER — DRONABINOL 2.5 MG PO CAPS
10.0000 mg | ORAL_CAPSULE | Freq: Two times a day (BID) | ORAL | Status: DC
Start: 2016-10-09 — End: 2016-10-14
  Administered 2016-10-09 – 2016-10-14 (×12): 10 mg via ORAL
  Filled 2016-10-08 (×12): qty 4

## 2016-10-08 MED ORDER — OXYCODONE-ACETAMINOPHEN 5-325 MG PO TABS
1.0000 | ORAL_TABLET | ORAL | Status: DC | PRN
  Administered 2016-10-09 – 2016-10-14 (×12): 1 via ORAL
  Filled 2016-10-08 (×12): qty 1

## 2016-10-08 NOTE — Telephone Encounter (Signed)
Call from [facility] [place]- states pt has been having dizziness and low blood pressures x 2 weeks.; she's in home at this time. BP 78/50 HR 125 Unable to get standing BP-would not read.Pt c/o SOB but O2 sats are 97 and 98 %. States he's eating/drinking - they are providing meals and Ensure. Also states pt too weak/dizzy to drive himself here for an appt. Inform if pt unable to come to be seen; he needs to go to ED to be evaluated. States she will call EMS and have him transported to ED.

## 2016-10-08 NOTE — ED Triage Notes (Signed)
Pt in from home via [facility] with near syncope with no LOC. Per EMS, pt was being visited by home health RN and felt weak. RN took his BP and found 90/50 lying, 70/40 sitting with 130 HR. CBG 91, pt was given 200 ml's NS and repeat 106/84. Reports dizziness x 2 wks. Alert

## 2016-10-08 NOTE — ED Notes (Signed)
MD at bedside. 

## 2016-10-08 NOTE — ED Notes (Signed)
Pt refusing care until he is placed in a room. MD made aware.

## 2016-10-08 NOTE — Progress Notes (Signed)
Pt refuses standing weight upon admission to this floor. Bed weight obatined. Pt also unable to complete orthostatic vital signs. Will continue to monitor.

## 2016-10-08 NOTE — ED Notes (Signed)
Pt refuses to be stuck for istat lactic acid. Requesting nurse draw from IV.

## 2016-10-08 NOTE — ED Notes (Signed)
Provided pt OJ for Low Blood sugar

## 2016-10-08 NOTE — H&P (Signed)
Date: 10/08/2016               Patient Name:  [name] MRN: [id number]  DOB: [birth year] Age / Sex: 71 y.o., male   PCP: Zada Finders, MD         Medical Service: Internal Medicine Teaching Service         Attending Physician: Dr. Algis Greenhouse    First Contact: Dr. Lorella Nimrod Pager: [phone]  Second Contact: Dr. Shela Leff Pager: [phone]       After Hours (After 5p/  First Contact Pager: [phone]  weekends / holidays): Second Contact Pager: [phone]   Chief Complaint: Hypotension  History of Present Illness: [name] is a 71 y.o. 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 Today he was visited by home health RN and his BP was found to be 70/40 when sitting and HR 130. He reports he has felt very fatigued and dizzy since he was discharge last month. He has dizzy spells several times daily that occur when he sits up or stands. During these episodes he also experiences palpitations and dyspnea. For this he will often use his albuterol inhaler which he states gives him "a little" relief. Last Thursday he stood up and passed out for a few minutes when his neighbor was visiting, but did not injure himself with the fall. He reports persistent poor appetite with minimal food intake, but he continues to drink plenty of fluids. He is able to take a few bites of food but quickly feels full and cannot eat more, no nausea. He reports some dysuria, urinary frequency chills, foul urine, and constipation with BM every 2-4 days for which he needs Miralax. He denies fevers, suprapubic pain, chest pain, diarrhea, or orthopnea. He arrived today via EMS and received 200 cc/hr NS en route.   In the ED he was afebrile, HR 128, BP 109/84, SpO2 97% on RA and appeared dehydrated on  exam. He received 1 L NS bolus and IV Ceftriaxone given concern for UTI. Labs remarkable for BNP 232, CBG 77,  UA with trace leuks, many bacteria, 0-5 squamous cells, negative nitrite, otherwise CBC, CMP, troponin, lactic acid unremarkable. EKG showed ectopic atrial tachycardia, CXR with left lung parenchymal scarring. IMTS contacted for admission.    Meds:  Current Meds  Medication Sig  . albuterol (VENTOLIN HFA) 108 (90 BASE) MCG/ACT inhaler Inhale 2 puffs into the lungs every 6 (six) hours as needed for wheezing or shortness of breath.  [name] apixaban (ELIQUIS) 2.5 MG TABS tablet Take 1 tablet (2.5 mg total) by mouth 2 (two) times daily.  [name] aspirin 81 MG chewable tablet Chew 1 tablet (81 mg total) by mouth daily.  [name] buPROPion (WELLBUTRIN SR) 100 MG 12 hr tablet Take 1 tablet (100 mg total) by mouth 2 (two) times daily.  [name] dronabinol (MARINOL) 10 MG capsule Take 1 capsule (10 mg total) by mouth 2 (two) times daily before lunch and supper.  . feeding supplement (BOOST / RESOURCE BREEZE) LIQD Take 1 Container by mouth 3 (three) times daily between meals.  . metoprolol succinate (TOPROL-XL) 50 MG 24 hr tablet Take 1 tablet (50 mg total) by mouth 2 (two) times daily. Take with or immediately following a meal.  . oxyCODONE-acetaminophen (PERCOCET) 10-325 MG tablet Take 1 tablet by  mouth every 4 (four) hours as needed for pain. Do not take more than 5 tablets in 24 hours.  . polyethylene glycol (MIRALAX / GLYCOLAX) packet Take 17 g by mouth daily.  . rosuvastatin (CRESTOR) 20 MG tablet Take 1 tablet (20 mg total) by mouth daily at 6 PM.  . tiotropium (SPIRIVA) 18 MCG inhalation capsule Place 1 capsule (18 mcg total) into inhaler and inhale daily.    Allergies: Allergies as of 10/08/2016 - Review Complete 10/08/2016  Allergen Reaction Noted  . Amiodarone Other (See Comments) 07/30/2015  . Penicillins Rash    Past Medical History:  Diagnosis Date  . Alcohol abuse   . Anxiety   . Avascular necrosis  of bones of both hips (HCC)    Status post bilateral hip replacements  . Bipolar disorder ([place])   . CHF (congestive heart failure) ([place])   . Chronic systolic heart failure ([place])    a. NICM - EF 35% with normal cors 2003. b. Last echo 11/2012 - EF 25-30%.  . CKD (chronic kidney disease)    Renal U/S 12/04/2009 showed no pathological findings. Labs 12/04/2009 include normal ESR, C3, C4; neg ANA; SPEP showed nonspecific increase in the alpha-2 region with no M-spike; UPEP showed no monoclonal free light chains; urine IFE showed polyclonal increase in feree Kappa and/or free Lambda light chains. Baseline Cr reported 1.7-2.5.  [name] COPD (chronic obstructive pulmonary disease) ([place])   . Depression   . DVT (deep venous thrombosis) ([place])    He has hypercoagulability with multiple prior DVTs  . E coli bacteremia   . Erectile dysfunction   . Failure to thrive (0-17) 08/2016   DEHYDRATION  . Gastritis   . GERD (gastroesophageal reflux disease)   . HCAP (healthcare-associated pneumonia) 08/24/2015  . Hydrocele, unspecified   . Hyperlipemia   . Hypertension   . 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  Thyroid scan 08/28/2009 was normal with no focal areas of abnormal increased or decreased activity seen; the uptake of I 131 sodium iodide at 24 hours was 5.7%.  TSH and free T4 normalized by 08/16/2009.  . Intestinal obstruction ([place])   . Lung nodule    Chest CT scan on 12/14/2008 showed a nodular opacity at the left lung base felt to most likely represent scarring.  Follow-up chest CT scan on 06/02/2010 showed parenchymal scarring in the left apex, left  lower lobe, and lingula; some of this scarring at the left base had a nodular appearance, unchanged. Chest 09/2012 - stable.  . Noncompliance   . On home oxygen therapy    "3L periodically" (08/24/2015)  . Osteoarthritis   . Pleural plaque    H/o asbestos exposure. Chest CT on 06/02/2010 showed stable extensive  calcified pleural plaques involving the left hemithorax, consistent with asbestos related pleural disease.  . Portal vein thrombosis   . Protein calorie malnutrition ([place]) 04/2016  . Spondylosis   . STEMI (ST elevation myocardial infarction) ([place]) 10/11/2014  . Tachycardia-bradycardia syndrome [facility])    a. 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 b. [employer] gen change 2010.  [name] Thrombocytopenia ([place])   . 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  . Weight loss    Normal colonoscopy by Dr. Olevia Perches on 11/06/2010.    Family History:  Family History  Problem Relation Age of Onset  . Hypertension Mother   . Asthma Mother   .  Coronary artery disease Mother   . Cancer Mother   . Stroke Mother   . Cancer Brother        Unsure of type.  [name] Heart disease Brother   . Heart attack Brother   . Heart attack Sister   . Colon cancer Neg Hx   . Lung cancer Neg Hx   . Prostate cancer Neg Hx     Social History:  Social History   Social History  . Marital status: Widowed    Spouse name: N/A  . Number of children: 10  . Years of education: N/A   Occupational History  . RETIRED Retired   Social History Main Topics  . Smoking status: Current Some Day Smoker    Packs/day: 0.25    Years: 54.00    Types: Cigarettes    Start date: 43  . Smokeless tobacco: Current User     Comment: Will discussed with his provider this week 2 per day  . Alcohol use No     Comment: 6/22//2016 "h/o etoh abuse; stopped drinking ~ 2012"  . Drug use: No     Comment: smokes marijuana every other day  . Sexual activity: Not Currently   Other Topics Concern  . Not on file   Social History Narrative   Retired Architect U27 yrs here in [place]   Smoker 1 pack last 1.5 days tobacco, smokes marijuana    Denies EtOH x 4 years (on 10/12/12)   9 kids    From [place]   [place] Veteran    12 grade education                Review of Systems: A complete ROS was negative except as per HPI.   Physical Exam: Blood pressure (!) 120/95, pulse (!) 129, resp. rate (!) 37, height 6' 3" (1.905 m), weight 134 lb (60.8 kg), SpO2 98 %. Body mass index is 16.71 kg/m.  General appearance: Cachectic elderly gentleman resting comfortably in bed, in no distress, polite and conversational HENT: Normocephalic, moist mucous membranes, neck supple Eyes: PERRL, non-icteric Cardiovascular: Tachycardic rate and regular rhythm, no murmurs, rubs, gallops Respiratory: Diminished breath sounds bilaterally, normal work of breathing Abdomen: BS+, soft, non-tender, scaphoid Extremities: Thin bulk and normal range of motion, no edema, 2+ peripheral pulses Skin: Warm, dry, intact, scattered hyperpigmented lesions across back (chronic), decreased skin turgor Neuro: Alert and oriented Psych: Appropriate affect, clear speech, thoughts linear and goal-directed  Assessment & Plan by Problem: Active Problems:   Orthostatic hypotension   Orthostatic hypotension and persistent fatigue, likely due to poor po intake, persistent tachyarrythmia, excessive Metoprolol dose, possible UTI. Experiencing fatigue and dizziness with position changes, along with palpitations and occasional dyspnea. He continues to endorse anorexia and poor po intake, although reports adequate fluid intake. Weight is stable. He received IV fluids via EMS and in the ED with improvement in his blood pressure however he remains tachycardic to HR 130. EKG shows sinus vs ectopic atrial tachycardia. He was hospitalized last month for failure to thrive, and had a negative workup for adrenal insufficiency. -- Cardiology consulted, will see him in AM -- interrogate pacemaker -- check orthostatic vitals -- IVF at 75 cc/hr overnight -- consider Metoprolol dose reduction   Possible UTI, reports dysuria, frequency, and foul smelling urine. UA with many bacteria, trace leuks and  negative nitrite.  -- Follow urine culture -- Ceftriaxone 1g QD  Atrial fibrillation with tachycardia bradycardia syndrome, EKG more consistent with sinus tachycardia --  Apixaban 2.5 mg BID -- Metoprolol ER 50 mg BID -- Aspirin 81 mg daily  Failure to thrive -- continue Boost TID, Marinol 10 mg BID  HLD -- Rosuvastatin 20 mg daily  Chronic pain -- home Oxycodone-APA 5-325 mg Q4h PRN  COPD -- home prn Albuterol, Spiriva QD  Constipation -- Miralax QD  Depression -- Bupropion 12hr 100 mg BID  FEN/GI: Regular diet, replete electrolytes as needed  DVT ppx: Eliquis  Code status: Would accept CPR/ACLS but states he would prefer DNI  Dispo: Admit patient to Observation with expected length of stay less than 2 midnights.  Signed: Asencion Partridge, MD 10/08/2016, 8:04 PM  Pager: [phone]

## 2016-10-08 NOTE — ED Notes (Signed)
Spoke with Magda Paganini with [employer]. She stated that she will be in the ED in about an hour to interrogate pace maker.

## 2016-10-08 NOTE — ED Provider Notes (Signed)
[facility] DEPT Provider Note   CSN: [id number] Arrival date & time: 10/08/16  1516     History   Chief Complaint Chief Complaint  Patient presents with  . Near Syncope    HPI [name] is a 71 y.o. male.  The history is provided by the patient.  Near Syncope  This is a new problem. The current episode started 1 to 2 hours ago. The problem occurs daily. The problem has been gradually improving. Associated symptoms include shortness of breath. Pertinent negatives include no chest pain, no abdominal pain and no headaches. The symptoms are aggravated by standing. Nothing relieves the symptoms. He has tried rest for the symptoms.    Past Medical History:  Diagnosis Date  . Alcohol abuse   . Anxiety   . Avascular necrosis of bones of both hips (HCC)    Status post bilateral hip replacements  . Bipolar disorder ([place])   . CHF (congestive heart failure) ([place])   . Chronic systolic heart failure ([place])    a. NICM - EF 35% with normal cors 2003. b. Last echo 11/2012 - EF 25-30%.  . CKD (chronic kidney disease)    Renal U/S 12/04/2009 showed no pathological findings. Labs 12/04/2009 include normal ESR, C3, C4; neg ANA; SPEP showed nonspecific increase in the alpha-2 region with no M-spike; UPEP showed no monoclonal free light chains; urine IFE showed polyclonal increase in feree Kappa and/or free Lambda light chains. Baseline Cr reported 1.7-2.5.  [name] COPD (chronic obstructive pulmonary disease) ([place])   . Depression   . DVT (deep venous thrombosis) ([place])    He has hypercoagulability with multiple prior DVTs  . E coli bacteremia   . Erectile dysfunction   . Failure to thrive (0-17) 08/2016   DEHYDRATION  . Gastritis   . GERD (gastroesophageal reflux disease)   . HCAP (healthcare-associated pneumonia) 08/24/2015  . Hydrocele, unspecified   . Hyperlipemia   . Hypertension   . 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  Thyroid scan 08/28/2009  was normal with no focal areas of abnormal increased or decreased activity seen; the uptake of I 131 sodium iodide at 24 hours was 5.7%.  TSH and free T4 normalized by 08/16/2009.  . Intestinal obstruction ([place])   . Lung nodule    Chest CT scan on 12/14/2008 showed a nodular opacity at the left lung base felt to most likely represent scarring.  Follow-up chest CT scan on 06/02/2010 showed parenchymal scarring in the left apex, left  lower lobe, and lingula; some of this scarring at the left base had a nodular appearance, unchanged. Chest 09/2012 - stable.  . Noncompliance   . On home oxygen therapy    "3L periodically" (08/24/2015)  . Osteoarthritis   . Pleural plaque    H/o asbestos exposure. Chest CT on 06/02/2010 showed stable extensive calcified pleural plaques involving the left hemithorax, consistent with asbestos related pleural disease.  . Portal vein thrombosis   . Protein calorie malnutrition ([place]) 04/2016  . Spondylosis   . STEMI (ST elevation myocardial infarction) ([place]) 10/11/2014  . 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)    a. 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 b. St[employer] Jude gen change 2010.  [name] Thrombocytopenia ([place])   . 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  . Weight loss    Normal colonoscopy by Dr. Olevia Perches on 11/06/2010.    Patient Active Problem  List   Diagnosis Date Noted  . Near syncope 10/08/2016  . Thrombocytopenia ([place]) 09/10/2016  . Non-compliance with treatment 09/05/2016  . Abdominal aortic atherosclerosis ([place]) 09/05/2016  . BMI less than 19,adult 09/05/2016  . Encounter for Medicare annual wellness exam 07/17/2016  . Counseling regarding advanced directives and goals of care 07/17/2016  . Failure to thrive in adult 04/29/2016  . Protein-calorie malnutrition, severe 02/18/2016  . Palliative care encounter   . Asbestosis ([place])   . CKD (chronic kidney disease)  stage 3, GFR 30-59 ml/min   . Coagulopathy ([place])   . Long term current use of opiate analgesic 06/27/2015  . Decreased appetite 06/19/2015  . Neuropathic pain of both feet 05/03/2015  . Chronic pain 12/13/2014  . Longstanding persistent atrial fibrillation ([place]) 10/11/2014  . Healthcare maintenance 01/25/2014  . BPH (benign prostatic hyperplasia) 01/20/2014  . PVD (peripheral vascular disease) ([place]) 12/10/2013  . Chronic combined systolic and diastolic congestive heart failure ([place]) 06/20/2010  . Long term current use of anticoagulant therapy 06/05/2010  . H/O Arterial embolism of leg  06/05/2010  . Major depressive disorder, recurrent episode ([place]) 01/19/2010  . History of hyperthyroidism 03/13/2009  . Bilateral hip pain 03/23/2007  . OSTEOARTHRITIS 09/05/2006  . COPD (chronic obstructive pulmonary disease) ([place]) 08/28/2006  . ERECTILE DYSFUNCTION 03/01/2006  . History of tobacco abuse 03/01/2006  . Essential hypertension 03/01/2006  . Cardiac pacemaker in situ 03/01/2006    Past Surgical History:  Procedure Laterality Date  . CARDIAC CATHETERIZATION  2003   Nl cors, EF 35%  . CARDIOVERSION N/A 05/27/2013   Procedure: CARDIOVERSION;  Surgeon: Candee Furbish, MD;  Location: [facility] ENDOSCOPY;  Service: Cardiovascular;  Laterality: N/A;  . EMBOLECTOMY Right 12/10/2013   Procedure: Right Femoral Embolectomy; Fasciotomy Right Lower Leg with Intraoperative arteriogram;  Surgeon: Rosetta Posner, MD;  Location: [facility];  Service: Vascular;  Laterality: Right;  . EMBOLECTOMY Left 10/12/2014   Procedure: Left Femoral Thrombectomy with intraoperative arteriogram;  Surgeon: Rosetta Posner, MD;  Location: [facility];  Service: Vascular;  Laterality: Left;  . FASCIOTOMY CLOSURE Right 12/15/2013   Procedure: LEG FASCIOTOMY CLOSURE;  Surgeon: Rosetta Posner, MD;  Location: [facility];  Service: Vascular;  Laterality: Right;  . HEMIARTHROPLASTY HIP Left 09/09/2002   bipolar; Dr. Lafayette Dragon  . INSERT / REPLACE / REMOVE  PACEMAKER  02/2003   Dr. Roe Rutherford, [employer] pulse generator identity SR model [id number] serial [id number]; gen change by Greggory Brandy  . INSERT / REPLACE / REMOVE PACEMAKER  06/17/2008   Lead and generator change w/ [employer] Tendril ST model [id number] (serial  [id number]).       . INTRAOPERATIVE ARTERIOGRAM Right 12/10/2013   Procedure: INTRA OPERATIVE ARTERIOGRAM;  Surgeon: Rosetta Posner, MD;  Location: [facility];  Service: Vascular;  Laterality: Right;  . JOINT REPLACEMENT    . TEE WITHOUT CARDIOVERSION N/A 05/27/2013   Procedure: TRANSESOPHAGEAL ECHOCARDIOGRAM (TEE);  Surgeon: Candee Furbish, MD;  Location: [facility] ENDOSCOPY;  Service: Cardiovascular;  Laterality: N/A;  . TOTAL HIP ARTHROPLASTY Right 03/08/2008    By Dr. Hiram Comber       Home Medications    Prior to Admission medications   Medication Sig Start Date End Date Taking? Authorizing Provider  albuterol (VENTOLIN HFA) 108 (90 BASE) MCG/ACT inhaler Inhale 2 puffs into the lungs every 6 (six) hours as needed for wheezing or shortness of breath. 01/06/15  Yes Aldine Contes, MD  apixaban (ELIQUIS) 2.5 MG TABS tablet Take  1 tablet (2.5 mg total) by mouth 2 (two) times daily. 09/20/16  Yes Zada Finders, MD  aspirin 81 MG chewable tablet Chew 1 tablet (81 mg total) by mouth daily. 11/17/14  Yes Riccardo Dubin, MD  buPROPion (WELLBUTRIN SR) 100 MG 12 hr tablet Take 1 tablet (100 mg total) by mouth 2 (two) times daily. 09/20/16  Yes Zada Finders, MD  dronabinol (MARINOL) 10 MG capsule Take 1 capsule (10 mg total) by mouth 2 (two) times daily before lunch and supper. 09/20/16  Yes Zada Finders, MD  feeding supplement (BOOST / RESOURCE BREEZE) LIQD Take 1 Container by mouth 3 (three) times daily between meals. 09/09/16  Yes Burgess Estelle, MD  metoprolol succinate (TOPROL-XL) 50 MG 24 hr tablet Take 1 tablet (50 mg total) by mouth 2 (two) times daily. Take with or immediately following a meal. 09/09/16  Yes Burgess Estelle, MD  oxyCODONE-acetaminophen  (PERCOCET) 10-325 MG tablet Take 1 tablet by mouth every 4 (four) hours as needed for pain. Do not take more than 5 tablets in 24 hours. 09/18/16 09/17/17 Yes Zada Finders, MD  polyethylene glycol (MIRALAX / GLYCOLAX) packet Take 17 g by mouth daily. 09/20/16  Yes Zada Finders, MD  rosuvastatin (CRESTOR) 20 MG tablet Take 1 tablet (20 mg total) by mouth daily at 6 PM. 10/08/15  Yes Norval Gable, MD  tiotropium (SPIRIVA) 18 MCG inhalation capsule Place 1 capsule (18 mcg total) into inhaler and inhale daily. 01/06/15  Yes Aldine Contes, MD    Family History Family History  Problem Relation Age of Onset  . Hypertension Mother   . Asthma Mother   . Coronary artery disease Mother   . Cancer Mother   . Stroke Mother   . Cancer Brother        Unsure of type.  [name] Heart disease Brother   . Heart attack Brother   . Heart attack Sister   . Colon cancer Neg Hx   . Lung cancer Neg Hx   . Prostate cancer Neg Hx     Social History Social History  Substance Use Topics  . Smoking status: Current Some Day Smoker    Packs/day: 0.25    Years: 54.00    Types: Cigarettes    Start date: 81  . Smokeless tobacco: Current User     Comment: Will discussed with his provider this week 2 per day  . Alcohol use No     Comment: 6/22//2016 "h/o etoh abuse; stopped drinking ~ 2012"     Allergies   Amiodarone and Penicillins   Review of Systems Review of Systems  Constitutional: Negative for chills and fever.  HENT: Negative for ear pain and sore throat.   Eyes: Negative for pain and visual disturbance.  Respiratory: Positive for shortness of breath. Negative for cough.   Cardiovascular: Positive for near-syncope. Negative for chest pain and palpitations.  Gastrointestinal: Negative for abdominal pain and vomiting.  Genitourinary: Negative for dysuria and hematuria.  Musculoskeletal: Negative for arthralgias and back pain.  Skin: Negative for color change and rash.  Neurological: Positive for  dizziness and weakness. Negative for tremors, seizures, syncope, facial asymmetry, speech difficulty, light-headedness, numbness and headaches.  All other systems reviewed and are negative.    Physical Exam Updated Vital Signs  ED Triage Vitals  Enc Vitals Group     BP 10/08/16 1516 109/84     Pulse Rate 10/08/16 1516 (!) 46     Resp 10/08/16 1516 17  Temp --      Temp src --      SpO2 10/08/16 1516 97 %     Weight 10/08/16 1522 134 lb (60.8 kg)     Height 10/08/16 1522 '6\' 3"'  (1.905 m)     Head Circumference --      Peak Flow --      Pain Score --      Pain Loc --      Pain Edu? --      Excl. in [place]? --     Physical Exam  Constitutional: He is oriented to person, place, and time. He appears cachectic.  HENT:  Head: Normocephalic and atraumatic.  Eyes: Conjunctivae are normal. Pupils are equal, round, and reactive to light.  Neck: Normal range of motion. Neck supple.  Cardiovascular: Regular rhythm, normal heart sounds and intact distal pulses.  Bradycardia present.   No murmur heard. Pulmonary/Chest: Effort normal and breath sounds normal. No respiratory distress.  Abdominal: Soft. Bowel sounds are normal. He exhibits no distension. There is no tenderness.  Musculoskeletal: Normal range of motion. He exhibits no edema.  Neurological: He is alert and oriented to person, place, and time. No cranial nerve deficit or sensory deficit. He exhibits normal muscle tone. Coordination normal.  5+/5 strength, normal sensation, no drift, normal finger to nose finger  Skin: Skin is warm and dry. Capillary refill takes less than 2 seconds.  Psychiatric: He has a normal mood and affect.  Nursing note and vitals reviewed.    ED Treatments / Results  Labs (all labs ordered are listed, but only abnormal results are displayed) Labs Reviewed  CBC WITH DIFFERENTIAL/PLATELET - Abnormal; Notable for the following:       Result Value   Platelets 120 (*)    All other components within  normal limits  COMPREHENSIVE METABOLIC PANEL - Abnormal; Notable for the following:    Chloride 112 (*)    CO2 21 (*)    BUN 28 (*)    Creatinine, Ser 1.51 (*)    Total Protein 6.2 (*)    AST 44 (*)    Total Bilirubin 1.4 (*)    GFR calc non Af Amer 44 (*)    GFR calc Af Amer 51 (*)    All other components within normal limits  URINALYSIS, ROUTINE W REFLEX MICROSCOPIC - Abnormal; Notable for the following:    APPearance HAZY (*)    Leukocytes, UA TRACE (*)    Bacteria, UA MANY (*)    Squamous Epithelial / LPF 0-5 (*)    All other components within normal limits  BRAIN NATRIURETIC PEPTIDE - Abnormal; Notable for the following:    B Natriuretic Peptide 232.7 (*)    All other components within normal limits  URINE CULTURE  CBG MONITORING, ED  I-STAT TROPOININ, ED  I-STAT CG4 LACTIC ACID, ED    EKG  EKG Interpretation None       Radiology Dg Chest Port 1 View  Result Date: 10/08/2016 CLINICAL DATA:  Near syncope EXAM: PORTABLE CHEST 1 VIEW COMPARISON:  09/05/2016 FINDINGS: COPD. Chronic pleuroparenchymal scarring in the left lung base unchanged. Calcified pleural plaque in the left lung base unchanged from prior CT. Right lung clear. Negative for heart failure. Transvenous pacemaker unchanged. Two leads in the right ventricle similar to the prior study. Atherosclerotic aorta. IMPRESSION: Extensive chronic pleuroparenchymal scarring in the left lung base. No acute abnormality. Electronically Signed   By: Franchot Gallo M.D.   On: 10/08/2016 18:12  Procedures Procedures (including critical care time)  Medications Ordered in ED Medications  metoprolol succinate (TOPROL-XL) 24 hr tablet 50 mg (50 mg Oral Given 10/08/16 2036)  cefTRIAXone (ROCEPHIN) 1 g in dextrose 5 % 50 mL IVPB (0 g Intravenous Stopped 10/08/16 2106)  apixaban (ELIQUIS) tablet 2.5 mg (not administered)  aspirin chewable tablet 81 mg (not administered)  buPROPion (WELLBUTRIN SR) 12 hr tablet 100 mg (not  administered)  dronabinol (MARINOL) capsule 10 mg (not administered)  metoprolol succinate (TOPROL-XL) 24 hr tablet 50 mg (not administered)  tiotropium (SPIRIVA) inhalation capsule 18 mcg (not administered)  0.9 %  sodium chloride infusion (not administered)  polyethylene glycol (MIRALAX / GLYCOLAX) packet 17 g (17 g Oral Not Given 10/08/16 2100)  albuterol (PROVENTIL) (2.5 MG/3ML) 0.083% nebulizer solution 3 mL (not administered)  feeding supplement (BOOST / RESOURCE BREEZE) liquid 1 Container (1 Container Oral Given 10/08/16 2225)  oxyCODONE-acetaminophen (PERCOCET/ROXICET) 5-325 MG per tablet 1 tablet (not administered)    And  oxyCODONE (Oxy IR/ROXICODONE) immediate release tablet 5 mg (not administered)  sodium chloride 0.9 % bolus 1,000 mL (0 mLs Intravenous Stopped 10/08/16 1741)     Initial Impression / Assessment and Plan / ED Course  I have reviewed the triage vital signs and the nursing notes.  Pertinent labs & imaging results that were available during my care of the patient were reviewed by me and considered in my medical decision making (see chart for details).     [name] is a 71 year old male with history of COPD, heart failure, high cholesterol, hypertension, atrial fibrillation/flutter who presents to the ED with near syncope. Patient's vitals at time of arrival to the ED are significant for bradycardia but otherwise are unremarkable and patient is without fever. Patient was seen by home health nurse today and noticed that he looked weaker than normal. Patient came for evaluation. Patient states that he has pain with urination, intermittent dizziness especially with standing but denies chest pain, new shortness of breath, abdominal pain. Patient with no headache. Overall patient appears cachectic and chronically ill-appearing. Exam is overall unremarkable. Syncope workup ordered with EKG labs, imaging, urine studies. Neurological exam is intact and no need for head  imaging. Patient denies any falls. EKG performed shows atrial flutter with RVR. Patient given his home metoprolol and normal saline bolus. Patient has pacemaker and was evaluated. Pacemaker possibly with malfunctioning as patient has been majority of his time in 130s to 150s according to room. Cardiology called and recommended admission to medicine service. They state if beta blocker doesn't work to start diltiazem drip. They will evaluate patient in the morning.  Labs significant for urinary tract infection with trace leukocytes in urine and many bacteria. Patient given IV Rocephin. Otherwise labs unremarkable with no significant anemia, acute kidney injury, lactic acidosis. Troponin within normal limits and no chest pain and doubt ACS. Chest x-ray with no signs of pneumonia, pneumothorax, pleural effusion. Patient admitted to family medicine service for urinary tract infection and A. fib with RVR, inpatient team made aware of plan to start IV dilt if needed.  Final Clinical Impressions(s) / ED Diagnoses   Final diagnoses:  Near syncope  Atrial fibrillation with RVR [facility])  Cystitis    New Prescriptions Current Discharge Medication List       Lennice Sites, DO 10/08/16 [place], [place], MD 10/09/16 [phone]

## 2016-10-08 NOTE — Patient Outreach (Addendum)
[facility]) Care Management   10/08/2016  [name] 1945-08-15 [id number]  [name] is an 71 y.o. male  Subjective:   Member alert & oriented x3, complains of pain 8/10.  Unable to localize pain, states "all over."  He continues to complain of dizziness since last Sunday, 5/13, with low blood pressure.  He was unable to check blood pressure today due to not having batteries.  He state it has been very hard for him to get out of bed due to the dizziness for fear of falling.    Objective:   Review of Systems  Constitutional: Positive for weight loss.  HENT: Negative.   Eyes: Negative.   Respiratory: Positive for shortness of breath.        Intermittent  Cardiovascular: Negative.   Gastrointestinal: Negative.   Genitourinary: Negative.   Musculoskeletal: Negative.   Skin: Negative.   Neurological: Positive for dizziness and weakness.  Endo/Heme/Allergies: Negative.   Psychiatric/Behavioral: Negative.     Physical Exam  Constitutional:  malnourished  Neck: Normal range of motion.  Cardiovascular: Normal heart sounds.   Irregular, tachy  Respiratory: Effort normal and breath sounds normal.  GI: Soft. Bowel sounds are normal.    BP (!) 78/50 (BP Location: Left Arm, Patient Position: Supine, Cuff Size: Normal)   Pulse (!) 125   Resp 18   Wt 134 lb (60.8 kg)   SpO2 98%   BMI 16.75 kg/m   Encounter Medications:   Outpatient Encounter Prescriptions as of 10/08/2016  Medication Sig Note  . albuterol (VENTOLIN HFA) 108 (90 BASE) MCG/ACT inhaler Inhale 2 puffs into the lungs every 6 (six) hours as needed for wheezing or shortness of breath.   [name] apixaban (ELIQUIS) 2.5 MG TABS tablet Take 1 tablet (2.5 mg total) by mouth 2 (two) times daily.   [name] aspirin 81 MG chewable tablet Chew 1 tablet (81 mg total) by mouth daily.   [name] buPROPion (WELLBUTRIN SR) 100 MG 12 hr tablet Take 1 tablet (100 mg total) by mouth 2 (two) times daily.   [name] dronabinol (MARINOL)  10 MG capsule Take 1 capsule (10 mg total) by mouth 2 (two) times daily before lunch and supper.   . feeding supplement (BOOST / RESOURCE BREEZE) LIQD Take 1 Container by mouth 3 (three) times daily between meals. 09/12/2016: Uses both Boost and Ensure when he can afford it  . metoprolol succinate (TOPROL-XL) 50 MG 24 hr tablet Take 1 tablet (50 mg total) by mouth 2 (two) times daily. Take with or immediately following a meal.   . oxyCODONE-acetaminophen (PERCOCET) 10-325 MG tablet Take 1 tablet by mouth every 4 (four) hours as needed for pain. Do not take more than 5 tablets in 24 hours.   . polyethylene glycol (MIRALAX / GLYCOLAX) packet Take 17 g by mouth daily.   . rosuvastatin (CRESTOR) 20 MG tablet Take 1 tablet (20 mg total) by mouth daily at 6 PM.   . tiotropium (SPIRIVA) 18 MCG inhalation capsule Place 1 capsule (18 mcg total) into inhaler and inhale daily.    No facility-administered encounter medications on file as of 10/08/2016.     Functional Status:   In your present state of health, do you have any difficulty performing the following activities: 09/17/2016 09/17/2016  Hearing? N N  Vision? N N  Difficulty concentrating or making decisions? [name]  Walking or climbing stairs? Y Y  Dressing or bathing? Y Y  Doing errands, shopping? N N  Preparing Food and eating ? - Y  Using the Toilet? - Y  In the past six months, have you accidently leaked urine? - Y  Do you have problems with loss of bowel control? - Y  Managing your Medications? - N  Managing your Finances? - N  Housekeeping or managing your Housekeeping? - Y  Some recent data might be hidden    Fall/Depression Screening:    Fall Risk  09/17/2016 09/17/2016 09/12/2016  Falls in the past year? Yes Yes Yes  Number falls in past yr: _0 Injury with Fall? No No No  Risk Factor Category  - - -  Risk for fall due to : History of fall(s);Impaired balance/gait History of fall(s);Impaired balance/gait;Impaired mobility History of  fall(s);Impaired balance/gait  Risk for fall due to (comments): - - -  Follow up - Education provided;Falls prevention discussed Education provided;Falls prevention discussed   PHQ 2/9 Scores 09/17/2016 09/17/2016 09/12/2016 07/17/2016 06/25/2016 06/04/2016 05/29/2016  PHQ - 2 Score _1 PHQ- 9 Score _2 Exception Documentation - - - - - - -    Assessment:    Met with member at scheduled time.  He is lying in bed, unable to get up to open door (door unlocked).  He report some shortness of breath with the dizziness, also state that when he does sit up he feels the urge to urinate, report burning with urination.  He is hypotensive today as well as reported last week during telephone call.  He denies contact from [facility] last week after this care manager called to report hypotension.  Unsure if message was left that he did not receive as member is known not to answer calls.    Call placed to primary MD office today, spoke to [place], [place].  Reported member's symptomatic hypotension.  He state that he does not feel he is up to driving himself to the office today.  With his complaints lasting almost 2 weeks, decision made to call EMS to transport to emergency department for further evaluation.  This care manager contacted EMS and stayed with member until they arrived and assumed care.  Will monitor for possible admission, will inform hospital liaison if member admitted.  Plan:   Will follow up next week with weekly transition of care call and follow up on ED visit.    Update 5/23 @ 0800:  Hospital liaison aware of admission, this care manager will follow up upon discharge.   [facility] CM Care Plan Problem One     Most Recent Value  Care Plan Problem One  Risk for readmission related to depression/failure to thrive as evidenced by recent hospitalization  Role Documenting the Problem One  Care Management [place] for Problem One  Active  [facility] Long Term  Goal (31-90 days)  Pt will not have any hospitalizations within the next 31 days  [facility] Long Term Goal Start Date  09/12/16  Interventions for Problem One Long Term Goal  Discussed with member the importance of following discharge instructions, including follow up appointments, medications, diet, to decrease the risk of readmission  [facility] CM Short Term Goal #1 (0-30 days)  Member will take all medications as prescribed over the next 4 weeks  [facility] CM Short Term Goal #1 Start Date  09/12/16  [facility] CM Short Term Goal #1 Met Date  10/02/16  Interventions for Short Term Goal #1  Medications  reviewed, educated on importance of taking as instructed, particularly ones for depression in effort to increase mood and appetite  [facility] CM Short Term Goal #2 (0-30 days)  Patient will keep and attend follow up appointment with primary MD within the next week  [facility] CM Short Term Goal #2 Start Date  09/12/16  [facility] CM Short Term Goal #2 Met Date  09/26/16  Interventions for Short Term Goal #2  Discussed with member the importance of follow up with PCP in effort to decrease risk of readmisison.  Transportation confirmed with member.    [facility] CM Short Term Goal #3 (0-30 days)  Member will report weight gain of at least 10 pounds over the next 4 weeks  [facility] CM Short Term Goal #3 Start Date  09/26/16  Interventions for Short Tern Goal #3  Assisted member with initiating meal assistance through [place], provided member with 2 cases of Ensure.  Educated on importance of nutrition and consitent weights to show weight gain.     [name], [place], MSN [place] [phone]

## 2016-10-08 NOTE — Telephone Encounter (Signed)
Agree with this plan.

## 2016-10-08 NOTE — ED Notes (Signed)
[employer] monitor not compatible with our reader. Called Rep, Windle Guard with [employer] will be returning call for on site pace maker interrogation.

## 2016-10-09 ENCOUNTER — Telehealth: Payer: Self-pay | Admitting: Internal Medicine

## 2016-10-09 ENCOUNTER — Encounter (HOSPITAL_COMMUNITY): Payer: Self-pay | Admitting: *Deleted

## 2016-10-09 ENCOUNTER — Encounter: Payer: Medicare HMO | Admitting: Internal Medicine

## 2016-10-09 DIAGNOSIS — I130 Hypertensive heart and chronic kidney disease with heart failure and stage 1 through stage 4 chronic kidney disease, or unspecified chronic kidney disease: Secondary | ICD-10-CM

## 2016-10-09 DIAGNOSIS — N3090 Cystitis, unspecified without hematuria: Secondary | ICD-10-CM | POA: Diagnosis not present

## 2016-10-09 DIAGNOSIS — Z880 Allergy status to penicillin: Secondary | ICD-10-CM | POA: Diagnosis not present

## 2016-10-09 DIAGNOSIS — I483 Typical atrial flutter: Secondary | ICD-10-CM | POA: Diagnosis not present

## 2016-10-09 DIAGNOSIS — G8929 Other chronic pain: Secondary | ICD-10-CM | POA: Diagnosis present

## 2016-10-09 DIAGNOSIS — K5909 Other constipation: Secondary | ICD-10-CM | POA: Diagnosis present

## 2016-10-09 DIAGNOSIS — J449 Chronic obstructive pulmonary disease, unspecified: Secondary | ICD-10-CM

## 2016-10-09 DIAGNOSIS — Z79899 Other long term (current) drug therapy: Secondary | ICD-10-CM

## 2016-10-09 DIAGNOSIS — I5022 Chronic systolic (congestive) heart failure: Secondary | ICD-10-CM | POA: Diagnosis not present

## 2016-10-09 DIAGNOSIS — I959 Hypotension, unspecified: Secondary | ICD-10-CM | POA: Diagnosis present

## 2016-10-09 DIAGNOSIS — Z86718 Personal history of other venous thrombosis and embolism: Secondary | ICD-10-CM

## 2016-10-09 DIAGNOSIS — E785 Hyperlipidemia, unspecified: Secondary | ICD-10-CM

## 2016-10-09 DIAGNOSIS — N400 Enlarged prostate without lower urinary tract symptoms: Secondary | ICD-10-CM

## 2016-10-09 DIAGNOSIS — I951 Orthostatic hypotension: Principal | ICD-10-CM

## 2016-10-09 DIAGNOSIS — I4891 Unspecified atrial fibrillation: Secondary | ICD-10-CM

## 2016-10-09 DIAGNOSIS — I252 Old myocardial infarction: Secondary | ICD-10-CM | POA: Diagnosis not present

## 2016-10-09 DIAGNOSIS — M1990 Unspecified osteoarthritis, unspecified site: Secondary | ICD-10-CM

## 2016-10-09 DIAGNOSIS — R112 Nausea with vomiting, unspecified: Secondary | ICD-10-CM | POA: Diagnosis present

## 2016-10-09 DIAGNOSIS — F329 Major depressive disorder, single episode, unspecified: Secondary | ICD-10-CM | POA: Diagnosis not present

## 2016-10-09 DIAGNOSIS — Z950 Presence of cardiac pacemaker: Secondary | ICD-10-CM | POA: Diagnosis not present

## 2016-10-09 DIAGNOSIS — N189 Chronic kidney disease, unspecified: Secondary | ICD-10-CM | POA: Diagnosis not present

## 2016-10-09 DIAGNOSIS — Z7982 Long term (current) use of aspirin: Secondary | ICD-10-CM

## 2016-10-09 DIAGNOSIS — I495 Sick sinus syndrome: Secondary | ICD-10-CM | POA: Diagnosis not present

## 2016-10-09 DIAGNOSIS — E43 Unspecified severe protein-calorie malnutrition: Secondary | ICD-10-CM | POA: Diagnosis not present

## 2016-10-09 DIAGNOSIS — R627 Adult failure to thrive: Secondary | ICD-10-CM | POA: Diagnosis not present

## 2016-10-09 DIAGNOSIS — E0590 Thyrotoxicosis, unspecified without thyrotoxic crisis or storm: Secondary | ICD-10-CM

## 2016-10-09 DIAGNOSIS — Z7901 Long term (current) use of anticoagulants: Secondary | ICD-10-CM

## 2016-10-09 DIAGNOSIS — I429 Cardiomyopathy, unspecified: Secondary | ICD-10-CM | POA: Diagnosis present

## 2016-10-09 DIAGNOSIS — I743 Embolism and thrombosis of arteries of the lower extremities: Secondary | ICD-10-CM

## 2016-10-09 DIAGNOSIS — I4892 Unspecified atrial flutter: Secondary | ICD-10-CM

## 2016-10-09 DIAGNOSIS — I5042 Chronic combined systolic (congestive) and diastolic (congestive) heart failure: Secondary | ICD-10-CM | POA: Diagnosis present

## 2016-10-09 DIAGNOSIS — R5383 Other fatigue: Secondary | ICD-10-CM | POA: Diagnosis not present

## 2016-10-09 DIAGNOSIS — N183 Chronic kidney disease, stage 3 (moderate): Secondary | ICD-10-CM

## 2016-10-09 DIAGNOSIS — R55 Syncope and collapse: Secondary | ICD-10-CM | POA: Diagnosis not present

## 2016-10-09 MED ORDER — MIDODRINE HCL 5 MG PO TABS
5.0000 mg | ORAL_TABLET | Freq: Three times a day (TID) | ORAL | Status: DC
  Administered 2016-10-10 – 2016-10-14 (×15): 5 mg via ORAL
  Filled 2016-10-09 (×15): qty 1

## 2016-10-09 MED ORDER — MIDODRINE HCL 5 MG PO TABS
5.0000 mg | ORAL_TABLET | Freq: Three times a day (TID) | ORAL | Status: DC
  Administered 2016-10-09: 5 mg via ORAL
  Filled 2016-10-09: qty 1

## 2016-10-09 MED ORDER — ENSURE ENLIVE PO LIQD: 237.0000 mL | Freq: Two times a day (BID) | ORAL | Status: DC

## 2016-10-09 NOTE — Progress Notes (Signed)
Orders state to give Metoprolol 50mg  daily, AND another order states to give metoprolo 50mg  Bid.  Per Dr. Reesa Chew, she will review and correct these confusing orders. Furthermore, BP 85/56, HR 84.  Per Dr. Reesa Chew, do not give any AM doses of aforementioned metoprolol.

## 2016-10-09 NOTE — Telephone Encounter (Signed)
Follow Up:   Pt says he is returning your call from today.

## 2016-10-09 NOTE — Telephone Encounter (Signed)
Spoke with patient.  He is in the hospital.  He will call back once he is discharged to have his appointment rescheduled.

## 2016-10-09 NOTE — Progress Notes (Signed)
   Subjective: Patient was still complaining of dizziness when seen this morning. He was also saying that he was unable to get a good night's sleep so would like to get some sleep and do not want much disruption.  Objective:  Vital signs in last 24 hours: Vitals:   10/09/16 0512 10/09/16 0833 10/09/16 0903 10/09/16 1200  BP: 136/69  (!) 85/56 90/67  Pulse: (!) 125  84   Resp: 18  18 18   Temp: 97.9 F (36.6 C)  98.7 F (37.1 C) 97.8 F (36.6 C)  TempSrc: Oral  Oral Oral  SpO2: 100% 96% 100% 99%  Weight: 135 lb 3.2 oz (61.3 kg)     Height:       Gen.Cachectic and emaciated man,in no acute distress. Lungs.Clear bilaterally,no added sound. CV. Tachycardia with gallop. Abdomen.Soft, nontender,nondistended,bowel sounds positive. Extremities.No edema, no cyanosis.Pulses 2+ bilaterally.  Assessment/Plan:  [name] is a 71 y.o. 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  Orthostatic hypotension and persistent fatigue. On presentation he was having positive orthostatic vitals, blood pressure improved with IV saline bolus. He has a chronic history of failure to thrive and has an extensive negative workup in the past. In fact he has gained some weight since his previous admission approximately 1 month ago. His discharge weight was 128 pounds, and it was 135 pounds during current admission. -Recheck orthostatic vital.  A. fib with tachycardia bradycardia syndrome.Most likely the cause of his hypotension.He was having atrial flutter with ventricular rate of about 130 on presentation. His metoprolol was decreased from 100-50 during his previous hospitalization because of softer blood pressure. He was tachycardic with gallop rhythm during morning rounds. Later paged by  nurse that  he was having softer blood pressure with heart rate in 80s. -Continue home dose of peptic 72.5 mg twice a day. -Continue metoprolol XR 50 mg daily-hold morning dose because of softer blood pressure. -Continue aspirin 81 mg daily.  Possible UTI. His UA shows many bacteria, with trace leukocytes and no nitrites, not very convincing for UTI. He does complain of dysuria and increased in urinary frequency. Urine culture results are pending. -He was started on ceftriaxone-we will continue until culture results.  Failure to thrive. Negative extensive workup in the past. His weight has been improved as compared to his previous hospitalization. -Continue Marinol 10 mg BID. -Continue boost 3 times a day.  Hyperlipidemia. Continue home dose of Resuvastatin 20 mg daily.  Chronic pain. Continue home dose of oxycodone, encouraged him to decrease his pain medications.  COPD. No current exacerbation. -Continue home when necessary albuterol and Spiriva.  Chronic constipation. Continue MiraLAX daily.  Depression. No acute exacerbation. -Continue home dose of bupropion 100 mg twice daily.  Dispo: Anticipated discharge in approximately 1-2 day(s).   Lorella Nimrod, MD 10/09/2016, 1:24 PM Pager: [phone]

## 2016-10-09 NOTE — Progress Notes (Signed)
Per pt, does not want any visitors, and wishes to only be minimally disturbed by staff.  Appropriate signs placed on door.

## 2016-10-09 NOTE — Telephone Encounter (Signed)
Pt called he is in the [place]. Needs to speak to you please.

## 2016-10-09 NOTE — Progress Notes (Signed)
Per order, orthostatic VS performed.  Lying = 92/64, HR 117;  Sitting = 101/68, HR 126; Standing at 0 min = 109/80, HR 126.  Pt was UNABLE to do the standing at 3 minutes VS, as he c/0 dizziness.

## 2016-10-09 NOTE — Progress Notes (Signed)
Initial Nutrition Assessment  DOCUMENTATION CODES:  Severe malnutrition in context of chronic illness, Underweight  INTERVENTION:  Ensure Enlive po BID, each supplement provides 350 kcal and 20 grams of protein  Education on how to increase caloric density of diet  High protein items at meals  NUTRITION DIAGNOSIS:  Malnutrition (Severe) related to poor appetite, catabolic illness (Cardiac cachexia, HF) as evidenced by severe muscle and fat depletion  GOAL:  Patient will meet greater than or equal to 90% of their needs  MONITOR:  PO intake, Supplement acceptance, Labs, Weight trends, I & O's  REASON FOR ASSESSMENT:  Malnutrition Screening Tool    ASSESSMENT:  71 y/o male PMHx Afib and tachybradycardia syndrome s/p pacemaker, CHF, COPD, Chronic pain, CKD, depression, anxiety, bipolar disorder, GERD. Presents with hypotension and fatigue after being assessed by home health and found w/ BP 70/40 sitting. Has had syncopal episodes. Has chronic poor appetite. Admitted for management of hypotension.  Patient has longstanding history of poor appetite intake/cardiac related cachexia. Had admission 1 month specifically for FTT. Today, he reports occasionally realizing he hasn't eaten anything during the past 2 days. There are days when he wont get out of bed due to fatigue. On these days, he doesn't eat. Additionally, He has early satiety and feels full after only a few bites.  He relies on frozen meals and other convenience foods at home. He drinks 2-3 Ensures/day. Notes that he does not have much support at home. He believes he WOULD eat better, if he had help with meal preparation; He is often too weak to get to his kitchen.  He was started on Marinol 09/20/16. RD asked today if this had any effect and he said "very, very little", Per pt, his doses had just been increased to 10 mg BID. Has not taken enough doses yet to feel impact of change.   Despite his report of marinol not helping, he has  noted a few sporadic occurrences recently where he will suddenly develop a great appetite and will eat more then typical without feeling full. He says these occur randomly and often they only last a meal and then he will go back to his early satiety. Again, he says these have only occurred recently.   This may, in part, speak to his weight gain in the last month. He was admitted last month at a weight around 116-121 lbs (103 thought innacurate). He has gained 10-15 lbs since then. He appears to be up 5-10 lbs in the last 6 months, but is still down 15-20 lbs in the last year. Of course, all changes can be affected by his HF.   Today, we discussed how to "make the most of each bite" by eating calorically dense foods such as PB, Cheese and Whole milk. Recommended keeping lower sodium convenience foods. He eats some high fiber/low kcal foods such as carrots, recommended keeping these to a minimum.  He was agreeable to Ensure Enlive.   Physical Exam-cachectic. Wide spread severe muscle/fat wasting.   Medications: Marinol, Boost Breeze, Miralax, IV abx, IVF, pain medication Labs: BUN/creat: 25/1.51, BG 70's   Recent Labs Lab 10/08/16 1642  NA 140  K 4.9  CL 112*  CO2 21*  BUN 28*  CREATININE 1.51*  CALCIUM 8.9  GLUCOSE 76   Diet Order:  Diet regular Room service appropriate? Yes; Fluid consistency: Thin  Skin:  Reviewed, no issues  Last BM:  5/22  Height:  Ht Readings from Last 1 Encounters:  10/08/16 6'  3" (1.905 m)   Weight:  Wt Readings from Last 1 Encounters:  10/09/16 135 lb 3.2 oz (61.3 kg)   Wt Readings from Last 10 Encounters:  10/09/16 135 lb 3.2 oz (61.3 kg)  10/08/16 134 lb (60.8 kg)  10/02/16 134 lb (60.8 kg)  09/26/16 136 lb (61.7 kg)  09/17/16 132 lb 1.6 oz (59.9 kg)  09/12/16 128 lb (58.1 kg)  09/10/16 128 lb 1.6 oz (58.1 kg)  09/04/16 103 lb 12.8 oz (47.1 kg)  07/31/16 130 lb (59 kg)  07/17/16 129 lb 6.4 oz (58.7 kg)   Ideal Body Weight:  89.1 kg  BMI:   Body mass index is 16.9 kg/m.  Estimated Nutritional Needs:  Kcal:  2150-2450 (35-40 kcal/kg bw) Protein:  90-105g (1.5-1.7 g/kg bw) Fluid:  Per MD  EDUCATION NEEDS:  Education needs addressed   Burtis Junes RD, LDN, CNSC Clinical Nutrition Pager: [phone] 10/09/2016 5:21 PM

## 2016-10-09 NOTE — Consult Note (Signed)
   [facility] Inpatient Consult   10/09/2016  [name] 11-09-1945 [id number]   Patient is currently active with [facility] for chronic disease management services.  Patient has been engaged by a [employer]. Patient is a 71 year old male with HF, end stage COPD, with hypotension and fatigue.  [facility] RNCM is aware of patient's hospitalization as she had contact EMS. Will follow for disposition and needs.   Made Inpatient Case Manager aware that [facility] following. Of note, [facility] Care Management services does not replace or interfere with any services that are needed or arranged by inpatient case management or social work.  For additional questions or referrals please contact:  Natividad Brood, RN BSN [facility] Liaison  [phone] business mobile phone Toll free office [phone]

## 2016-10-09 NOTE — Care Management Note (Signed)
Case Management Note  Patient Details  Name: SHAMAN MUSCARELLA MRN: [id number] Date of Birth: 28-Apr-1946  Subjective/Objective:                 Spoke to patient at the bedside. He states he lives at home alone. His daughter provides support, they talk on the phone daily and she comes over several times a week to supply groceries and some transportation, he does endorse driving the pharmacy. Patient states he has a Higher education careers adviser through [facility] named Brayton Layman who comes weekly or every other week. He states that he has mobile meals that is supplying him with a meal a day for the next 30 days. He states he has a RW, cane, and home oxygen he uses PRN through [facility].   Action/Plan:  CM will continue to follow.  Expected Discharge Date:                  Expected Discharge Plan:  [place] (Has [facility] RN through [facility])  In-House Referral:     Discharge planning Services  CM Consult  Post Acute Care Choice:  Home Health Choice offered to:     DME Arranged:    DME Agency:     HH Arranged:  RN [place] Agency:  [place]  Status of Service:  In process, will continue to follow  If discussed at Long Length of Stay Meetings, dates discussed:    Additional Comments:  Carles Collet, RN 10/09/2016, 10:49 AM

## 2016-10-09 NOTE — Consult Note (Signed)
Cardiology Consult    Patient ID: [name] MRN: [id number], DOB/AGE: 73-20-1947   Admit date: 10/08/2016 Date of Consult: 10/09/2016  Primary Physician: Zada Finders, MD Primary Cardiologist: Dr. Rayann Heman Requesting Provider: Dr. Beryle Beams  Reason for Consult: Afib  Patient Profile    [name] is a 71 yo male with a PMH significant for Afib, tachy-brady syndrome s/p PPM, recurrent DVT and arterial thrombus (was on coumadin), COPD, CKD stage III, chronic systolic heart failure, anxiety, alcohol use, bipolar disorder, lung nodule, failure to thrive, and medication noncompliance. He presented to [facility] with an episode of dizziness, hypotension, and near-syncope noted by his home health nurse.  [name] is a 71 y.o. male who is being seen today for the evaluation of Afib at the request of Dr. Beryle Beams.    Past Medical History   Past Medical History:  Diagnosis Date  . Alcohol abuse   . Anxiety   . Avascular necrosis of bones of both hips (HCC)    Status post bilateral hip replacements  . Bipolar disorder ([place])   . CHF (congestive heart failure) ([place])   . Chronic systolic heart failure ([place])    a. NICM - EF 35% with normal cors 2003. b. Last echo 11/2012 - EF 25-30%.  . CKD (chronic kidney disease)    Renal U/S 12/04/2009 showed no pathological findings. Labs 12/04/2009 include normal ESR, C3, C4; neg ANA; SPEP showed nonspecific increase in the alpha-2 region with no M-spike; UPEP showed no monoclonal free light chains; urine IFE showed polyclonal increase in feree Kappa and/or free Lambda light chains. Baseline Cr reported 1.7-2.5.  [name] COPD (chronic obstructive pulmonary disease) ([place])   . Depression   . DVT (deep venous thrombosis) ([place])    He has hypercoagulability with multiple prior DVTs  . E coli bacteremia   . Erectile dysfunction   . Failure to thrive (0-17) 08/2016   DEHYDRATION  . Gastritis   . GERD (gastroesophageal reflux disease)   . HCAP  (healthcare-associated pneumonia) 08/24/2015  . Hydrocele, unspecified   . Hyperlipemia   . Hypertension   . 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  Thyroid scan 08/28/2009 was normal with no focal areas of abnormal increased or decreased activity seen; the uptake of I 131 sodium iodide at 24 hours was 5.7%.  TSH and free T4 normalized by 08/16/2009.  . Intestinal obstruction ([place])   . Lung nodule    Chest CT scan on 12/14/2008 showed a nodular opacity at the left lung base felt to most likely represent scarring.  Follow-up chest CT scan on 06/02/2010 showed parenchymal scarring in the left apex, left  lower lobe, and lingula; some of this scarring at the left base had a nodular appearance, unchanged. Chest 09/2012 - stable.  . Noncompliance   . On home oxygen therapy    "3L periodically" (08/24/2015)  . Osteoarthritis   . Pleural plaque    H/o asbestos exposure. Chest CT on 06/02/2010 showed stable extensive calcified pleural plaques involving the left hemithorax, consistent with asbestos related pleural disease.  . Portal vein thrombosis   . Protein calorie malnutrition ([place]) 04/2016  . Spondylosis   . STEMI (ST elevation myocardial infarction) ([place]) 10/11/2014  . Tachycardia-bradycardia syndrome [facility])    a. 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 b. St. Jude gen change 2010.  [name] Thrombocytopenia ([place])   . Thrombosis    History of arterial and venous thrombosis including portal vein thrombosis, deep vein  thrombosis, and superior mesenteric artery thrombosis.  . Weight loss    Normal colonoscopy by Dr. Olevia Perches on 11/06/2010.    Past Surgical History:  Procedure Laterality Date  . CARDIAC CATHETERIZATION  2003   Nl cors, EF 35%  . CARDIOVERSION N/A 05/27/2013   Procedure: CARDIOVERSION;  Surgeon: Candee Furbish, MD;  Location: [facility] ENDOSCOPY;  Service: Cardiovascular;  Laterality: N/A;  . EMBOLECTOMY Right 12/10/2013   Procedure: Right Femoral Embolectomy; Fasciotomy Right  Lower Leg with Intraoperative arteriogram;  Surgeon: Rosetta Posner, MD;  Location: [place];  Service: Vascular;  Laterality: Right;  . EMBOLECTOMY Left 10/12/2014   Procedure: Left Femoral Thrombectomy with intraoperative arteriogram;  Surgeon: Rosetta Posner, MD;  Location: [place];  Service: Vascular;  Laterality: Left;  . FASCIOTOMY CLOSURE Right 12/15/2013   Procedure: LEG FASCIOTOMY CLOSURE;  Surgeon: Rosetta Posner, MD;  Location: [place];  Service: Vascular;  Laterality: Right;  . HEMIARTHROPLASTY HIP Left 09/09/2002   bipolar; Dr. Lafayette Dragon  . INSERT / REPLACE / REMOVE PACEMAKER  02/2003   Dr. Roe Rutherford, St. [employer] Medical pulse generator identity SR model [id number] serial [id number]; gen change by Greggory Brandy  . INSERT / REPLACE / REMOVE PACEMAKER  06/17/2008   Lead and generator change w/ St. [employer] Medical Tendril ST model [id number] (serial  [id number]).       . INTRAOPERATIVE ARTERIOGRAM Right 12/10/2013   Procedure: INTRA OPERATIVE ARTERIOGRAM;  Surgeon: Rosetta Posner, MD;  Location: [place];  Service: Vascular;  Laterality: Right;  . JOINT REPLACEMENT    . TEE WITHOUT CARDIOVERSION N/A 05/27/2013   Procedure: TRANSESOPHAGEAL ECHOCARDIOGRAM (TEE);  Surgeon: Candee Furbish, MD;  Location: [facility] ENDOSCOPY;  Service: Cardiovascular;  Laterality: N/A;  . TOTAL HIP ARTHROPLASTY Right 03/08/2008    By Dr. Hiram Comber     Allergies  Allergies  Allergen Reactions  . Amiodarone Other (See Comments)    Thyroiditis in the setting of amiodarone use  . Penicillins Rash    Has patient had a PCN reaction causing immediate rash, facial/tongue/throat swelling, SOB or lightheadedness with hypotension: Yes Has patient had a PCN reaction causing severe rash involving mucus membranes or skin necrosis: No Has patient had a PCN reaction that required hospitalization No Has patient had a PCN reaction occurring within the last 10 years: No If all of the above answers are "NO", then may proceed with Cephalosporin use.    History of  Present Illness    Mr. Uher is well-known to our service and last saw Dr. Rayann Heman in clinic on 07/31/16. He has failed medical therapy for Afib, including multaq, amiodarone, and tikosyn. He also attempted flutter ablation in 2010, but the procedure was aborted for right and left common femoral vein occlusion/stenosis.  He had PPM placed 02/2003 and generator change 06/17/2008. He was admitted (7/24-12/20/13) for RLE arterial thrombosis and underwent embolectomy and fasciotomy at that time.He was hospitalized 5/24-6/1 for persistent atrial fibrillation with RVR with subsequent embolization in the setting of nonadherence to oral anticoagulation that resulted in STEMI, TIA, lower extremity ischemia and required embolectomy. He was noted to have EF 30-35% with diffuse hypokinesis on echo during that admission.  At his recent clinic visit, he still had symptomatic bouts of RVR. His toprol dose had been decreased for hypotension.  He was referred to Dr. Remus Blake at [facility] for further consideration of ablation via alternate access points; however, he canceled the appointment because it was too far away.   On 10/08/16, a home health  RN visited his home and noted he was hypotensive. He reported dizziness and near-syncope, so he was brought to [facility] for further evaluation. Upon arrival, he was noted to by bradycardic. He was admitted to hospital medicine. Cardiology was consulted for management of his tachy-brady with Afib. On my interview, he feels palpitations and has increases in heart rate with minimal exertion (e.g., sitting up in bed). These palpitations are accompanied by shortness of breath. He was nauseous this morning, no vomiting. He has very poor PO intake and is likely dry at this point. He takes marinol at home, which helps his appetite. At his last clinic visit, his toprol was decreased from 100 mg BID to 50 mg BID for dizziness and hypotension. He denies chest pain. He states he has been having dizzy spells  for the past 2 weeks. He will likely need pressure support in order to increase his toprol for better rate control.   Inpatient Medications    . apixaban  2.5 mg Oral BID  . aspirin  81 mg Oral Daily  . buPROPion  100 mg Oral BID  . dronabinol  10 mg Oral BID AC  . feeding supplement  1 Container Oral TID BM  . metoprolol succinate  50 mg Oral Daily  . polyethylene glycol  17 g Oral Daily  . tiotropium  18 mcg Inhalation Daily     Outpatient Medications    Prior to Admission medications   Medication Sig Start Date End Date Taking? Authorizing Provider  albuterol (VENTOLIN HFA) 108 (90 BASE) MCG/ACT inhaler Inhale 2 puffs into the lungs every 6 (six) hours as needed for wheezing or shortness of breath. 01/06/15  Yes Aldine Contes, MD  apixaban (ELIQUIS) 2.5 MG TABS tablet Take 1 tablet (2.5 mg total) by mouth 2 (two) times daily. 09/20/16  Yes Zada Finders, MD  aspirin 81 MG chewable tablet Chew 1 tablet (81 mg total) by mouth daily. 11/17/14  Yes Riccardo Dubin, MD  buPROPion (WELLBUTRIN SR) 100 MG 12 hr tablet Take 1 tablet (100 mg total) by mouth 2 (two) times daily. 09/20/16  Yes Zada Finders, MD  dronabinol (MARINOL) 10 MG capsule Take 1 capsule (10 mg total) by mouth 2 (two) times daily before lunch and supper. 09/20/16  Yes Zada Finders, MD  feeding supplement (BOOST / RESOURCE BREEZE) LIQD Take 1 Container by mouth 3 (three) times daily between meals. 09/09/16  Yes Burgess Estelle, MD  metoprolol succinate (TOPROL-XL) 50 MG 24 hr tablet Take 1 tablet (50 mg total) by mouth 2 (two) times daily. Take with or immediately following a meal. 09/09/16  Yes Burgess Estelle, MD  oxyCODONE-acetaminophen (PERCOCET) 10-325 MG tablet Take 1 tablet by mouth every 4 (four) hours as needed for pain. Do not take more than 5 tablets in 24 hours. 09/18/16 09/17/17 Yes Zada Finders, MD  polyethylene glycol (MIRALAX / GLYCOLAX) packet Take 17 g by mouth daily. 09/20/16  Yes Zada Finders, MD  rosuvastatin  (CRESTOR) 20 MG tablet Take 1 tablet (20 mg total) by mouth daily at 6 PM. 10/08/15  Yes Norval Gable, MD  tiotropium (SPIRIVA) 18 MCG inhalation capsule Place 1 capsule (18 mcg total) into inhaler and inhale daily. 01/06/15  Yes Aldine Contes, MD     Family History    Family History  Problem Relation Age of Onset  . Hypertension Mother   . Asthma Mother   . Coronary artery disease Mother   . Cancer Mother   . Stroke Mother   .  Cancer Brother        Unsure of type.  [name] Heart disease Brother   . Heart attack Brother   . Heart attack Sister   . Colon cancer Neg Hx   . Lung cancer Neg Hx   . Prostate cancer Neg Hx     Social History    Social History   Social History  . Marital status: Widowed    Spouse name: N/A  . Number of children: 10  . Years of education: N/A   Occupational History  . RETIRED Retired   Social History Main Topics  . Smoking status: Current Some Day Smoker    Packs/day: 0.25    Years: 54.00    Types: Cigarettes    Start date: 57  . Smokeless tobacco: Current User     Comment: Will discussed with his provider this week 2 per day  . Alcohol use No     Comment: 6/22//2016 "h/o etoh abuse; stopped drinking ~ 2012"  . Drug use: Yes    Frequency: 1.0 time per week    Types: Marijuana     Comment: smokes marijuana every other day  . Sexual activity: Not Currently   Other Topics Concern  . Not on file   Social History Narrative   Retired Architect N62 yrs here in [place]   Smoker 1 pack last 1.5 days tobacco, smokes marijuana    Denies EtOH x 4 years (on 10/12/12)   9 kids    From [place]   [place] Veteran    12 grade education               Review of Systems    General:  No chills, fever, night sweats or weight changes.  Cardiovascular:  No chest pain, + dyspnea on exertion, no edema, no orthopnea, + palpitations, paroxysmal nocturnal dyspnea. Dermatological: No rash, lesions/masses Respiratory: No cough, dyspnea Urologic:  No hematuria, dysuria Abdominal:   + nausea, no vomiting, diarrhea, bright red blood per rectum, melena, or hematemesis Neurologic:  No visual changes, wkns, changes in mental status. All other systems reviewed and are otherwise negative except as noted above.  Physical Exam    Blood pressure 90/67, pulse 84, temperature 97.8 F (36.6 C), temperature source Oral, resp. rate 18, height 6' 3" (1.905 m), weight 135 lb 3.2 oz (61.3 kg), SpO2 99 %.  General: Pleasant, NAD Psych: Normal affect. Neuro: Alert and oriented X 3. Moves all extremities spontaneously. HEENT: Normal  Neck: Supple without bruits or JVD. Lungs:  Resp regular and unlabored, inspiratory wheezes and rhonchi Heart: irregular rate and rhythm, no murmurs. Abdomen: Soft, non-tender, non-distended, BS + x 4.  Extremities: No clubbing, cyanosis or edema. DP/PT/Radials 2+ and equal bilaterally.  Labs    Troponin [facility] Test)  Recent Labs  10/08/16 1650  TROPIPOC 0.02   No results for input(s): CKTOTAL, CKMB, TROPONINI in the last 72 hours. Lab Results  Component Value Date   WBC 7.0 10/08/2016   HGB 14.9 10/08/2016   HCT 44.5 10/08/2016   MCV 94.3 10/08/2016   PLT 120 (L) 10/08/2016    Recent Labs Lab 10/08/16 1642  NA 140  K 4.9  CL 112*  CO2 21*  BUN 28*  CREATININE 1.51*  CALCIUM 8.9  PROT 6.2*  BILITOT 1.4*  ALKPHOS 63  ALT 48  AST 44*  GLUCOSE 76   Lab Results  Component Value Date   CHOL 92 10/12/2014   HDL 36 (L)  10/12/2014   LDLCALC 50 10/12/2014   TRIG 31 10/12/2014   Lab Results  Component Value Date   DDIMER 1.82 (H) 07/29/2015     Radiology Studies    Dg Chest Port 1 View  Result Date: 10/08/2016 CLINICAL DATA:  Near syncope EXAM: PORTABLE CHEST 1 VIEW COMPARISON:  09/05/2016 FINDINGS: COPD. Chronic pleuroparenchymal scarring in the left lung base unchanged. Calcified pleural plaque in the left lung base unchanged from prior CT. Right lung clear. Negative for heart  failure. Transvenous pacemaker unchanged. Two leads in the right ventricle similar to the prior study. Atherosclerotic aorta. IMPRESSION: Extensive chronic pleuroparenchymal scarring in the left lung base. No acute abnormality. Electronically Signed   By: Franchot Gallo M.D.   On: 10/08/2016 18:12    ECG & Cardiac Imaging    EKG 10/08/16: atrial flutter  Echocardiogram  08/14/15: Study Conclusions - Left ventricle: The cavity size was moderately dilated. Wall   thickness was normal. The estimated ejection fraction was 30%.   Diffuse hypokinesis. - Aortic valve: There was mild regurgitation. - Mitral valve: There was mild to moderate regurgitation. - Left atrium: The atrium was moderately dilated. - Right atrium: The atrium was mildly dilated. - Tricuspid valve: There was mild-moderate regurgitation.   Assessment & Plan    1. Atrial fibrillation/flutter - toprol was recently decreased from 100 mg bid to 50 mg bid - currently on toprol 50 mg daily - eliquis 2.5 mg (reduced dose for weight and sCr > 1.5) - will discuss case with EP to see if he is a candidate for AV nodal ablation   - for now, start midodrine. When pressure improves, increase toprol to 50 mg BID for better rate control   2. Chronic systolic heart failure - unable to start or titrate heart failure medications in the setting of hypotension - start midodrine for pressure support   3. CKD stage III - sCr 1.51, baseline is 1.3-1.5 - per primary team, will allow pressure to run on the higher end to perfuse kidneys   Signed, Ledora Bottcher, PA-C 10/09/2016, 1:56 PM [phone]  Personally seen and examined. Agree with above.  Pleasant 71 year old with chronic history of difficult to control atrial flutter/fib with pacemaker in place, hypotension, cachexia, EF 30%.    - Had a discussion with Dr. Rayann Heman his EP MD who he knows well and asked him about AV nodal ablation. He would not be a candidate, unable to gain  access via femoral vein. He did mention that he has referred him to [facility] to discuss potential novel approaches (transhepatic for instance) but of course there is no guarantee that he would be a candidate. (Patient did not go to this appt). Suggests keeping him on home meds as best as possible. Toprol is reasonable. Diltiazem would be a nice addition despite reduced EF but he does not have any BP to support adding this back.   - Unfortunately, he is showing signs of cardiac cachexia and failure to thrive.   - Midodrine to support BP may be an option. Watch for any worsening HF symptoms.   - Ensure, weight gain, would help with BP support.   Please let us know if we can be of further assistance.  Candee Furbish, MD

## 2016-10-09 NOTE — Care Management Obs Status (Signed)
[place] NOTIFICATION   Patient Details  Name: [name] MRN: [id number] Date of Birth: 02/08/1946   Medicare Observation Status Notification Given:  Yes    Carles Collet, RN 10/09/2016, 2:16 PM

## 2016-10-10 LAB — URINE CULTURE: Culture: <10000 — AB

## 2016-10-10 MED ORDER — ONDANSETRON HCL 4 MG/2ML IJ SOLN
4.0000 mg | Freq: Four times a day (QID) | INTRAMUSCULAR | Status: DC | PRN
  Administered 2016-10-10 – 2016-10-14 (×3): 4 mg via INTRAVENOUS
  Filled 2016-10-10 (×4): qty 2

## 2016-10-10 NOTE — Progress Notes (Signed)
Pt vomiting, paged MD regarding no PRN medications for zofran. MD stated she would place. Pt states he has not eaten breakfast and believes its from taking pain medications on an empty stomach. Pt washed up and changed gown. Will continue to monitor  [name]

## 2016-10-10 NOTE — Progress Notes (Signed)
MD called to obtain pt vital signs, specifically heart rate and blood pressure, MD informed, no new orders at this time  [name] Leory Plowman

## 2016-10-10 NOTE — Progress Notes (Signed)
Medicine attending: I examined this patient today and I concur with the evaluation and management plan as recorded by resident physician Dr.Sumayya Amin. We appreciate cardiology input.  Limited options for this patient. Heart rate fluctuating between 100 and 130.  Midodrine added in attempt to improve blood pressure so that beta-blocker dose can be increased.  Situation remains challenging.  No further local options.  We reviewed with the patient seeking a second opinion at [facility]  He failed to comply with the previously scheduled appointment.  He is willing to consider rescheduling. Impression: 1.  Refractory atrial arrhythmias 2.  Ischemic and nonischemic cardiomyopathy 3.  Status post previous venous and arterial thrombotic events on chronic anticoagulation

## 2016-10-10 NOTE — Progress Notes (Signed)
   Subjective: Patient was feeling better once seen this morning, later he developed mild nausea and vomited once. His nausea resolved with Zofran and he was able to eat some of his breakfast.  Objective:  Vital signs in last 24 hours: Vitals:   10/10/16 0106 10/10/16 0559 10/10/16 0740 10/10/16 0811  BP: 98/67 107/78 100/76   Pulse: (!) 127 (!) 125 (!) 128   Resp: 18 18 20    Temp: 97.9 F (36.6 C) 98.4 F (36.9 C) 98.6 F (37 C)   TempSrc: Oral Oral Oral   SpO2: 100% 99% 99% 98%  Weight:  136 lb 12.8 oz (62.1 kg)    Height:       Gen. Cachectic man,in no acute distress. CV. Tachycardia. Lungs.. Few scattered coarse breath sounds bilaterally. Abdomen.Soft, nontender,bowel sounds positive. Extremities.No edema, no cyanosis.Pulses 2+ bilaterally.  Urine culture  Order: [id number]  Status:  Final result Visible to patient:  No (Not Released) Next appt:  10/30/2016 at 03:45 PM in Internal Medicine Zada Finders, MD)   2d ago  Specimen Description URINE, RANDOM   Special Requests ADDED 0114 10/09/16   Culture <10,000 COLONIES/mL INSIGNIFICANT GROWTH    Report Status 10/10/2016 FINAL   Resulting Agency [facility]    Specimen Collected: 10/08/16 17:01 Last Resulted: 10/10/16 08:22         Assessment/Plan:  Travis Safranek Hookeris a 71 y.o.gentlemanwith PMH Afib with tachybradycardia syndrome s/ppacemaker placement, chronic combined systolic and diastolic congestive heart failure (EF 30% 07/2015), Gold stage II COPD, chronic pain, CKD,and depression who presents for fatigue and hypotension.  Orthostatic hypotension and persistent fatigue. He was having negative orthostasis on recheck, although still complaining of dizziness when standing from sitting or lying. His blood pressure remains little softer, although improving as compared to yesterday. He was started on midodrine by cardiology. -Continue midodrine 5 mg 3 times daily as advised by cardiology.  A. fib with  tachycardia bradycardia syndrome. He continued to have tachycardia with A flutter. We are very limited in his case as he has failed multiple options before. Cardiology added midodrine with the hope to raise his blood pressure to a reasonable point so they can increase his metoprolol for better rate control. -Continue home dose of Eliquis 2.5 milligrams twice daily. -Continue Toprol 50 mg daily. -Continue aspirin 81 mg daily.  Possible UTI. His urine culture was negative. No UTI. -Discontinue ceftriaxone.  Failure to thrive. Negative extensive workup in the past. -Continue Marinol 10 mg BID. -Dietitian are recommending addition Ensure.  Hyperlipidemia. Continue home dose of Resuvastatin 20 mg daily.  Chronic pain. Continue home dose of oxycodone.  COPD. No current exacerbation. -Continue home when necessary albuterol and Spiriva.  Chronic constipation. Continue MiraLAX daily.  Depression. No acute exacerbation. -Continue home dose of bupropion 100 mg twice daily.  Dispo: Anticipated discharge in approximately 1-2 day(s).   Lorella Nimrod, MD 10/10/2016, 10:37 AM Pager: [phone]

## 2016-10-10 NOTE — Progress Notes (Signed)
Paged cardiology regarding telemetry reading a flutter since admission  Crsitn [name]

## 2016-10-11 DIAGNOSIS — R55 Syncope and collapse: Secondary | ICD-10-CM

## 2016-10-11 MED ORDER — METOPROLOL SUCCINATE ER 50 MG PO TB24
50.0000 mg | ORAL_TABLET | Freq: Two times a day (BID) | ORAL | Status: DC
  Administered 2016-10-11 – 2016-10-14 (×6): 50 mg via ORAL
  Filled 2016-10-11 (×6): qty 1

## 2016-10-11 NOTE — Progress Notes (Signed)
   Subjective: This patient was feeling okay this morning. Still complaining of dizziness with ambulation.  Objective:  Vital signs in last 24 hours: Vitals:   10/10/16 1936 10/11/16 0503 10/11/16 0731 10/11/16 0950  BP: 102/74 95/70  105/79  Pulse: (!) 124 (!) 123    Resp: 18 18    Temp: 98.2 F (36.8 C) 98.3 F (36.8 C)    TempSrc: Oral Oral    SpO2: 99% 100% 99%   Weight:  127 lb 6.4 oz (57.8 kg)    Height:       Gen. Thin, emaciated man,in no acute distress. CV.Tachycardia. Lungs.. Few scattered coarse breath sounds bilaterally. Abdomen.Soft, nontender,bowel sounds positive. Extremities.No edema, no cyanosis.Pulses 2+ bilaterally.  Assessment/Plan:  Travis Langan Hookeris a 71 y.o.gentlemanwith PMH Afib with tachybradycardia syndrome s/ppacemaker placement, chronic combined systolic and diastolic congestive heart failure (EF 30% 07/2015), Gold stage II COPD, chronic pain, CKD,and depression who presents for fatigue and hypotension.  Orthostatic hypotension and persistent fatigue. Most likely because of his persistent atrial flutter. His blood pressure is mildly improved with midodrine. -Continue current dose of midodrine 5 mg 3 times a day, can be increased to 10 if needed.  A. fib with tachycardia bradycardia syndrome. He continued to have tachycardia with A flutter. -We will increase his Toprol to 50 mg twice daily. -Continue Eliquis 2.5 mg daily. -Continue aspirin 81 mg daily.  Failure to thrive.Negative extensive workup in the past. -Continue Marinol 10 mg BID. -Dietitian are recommending addition Ensure.  Hyperlipidemia.Continue home dose of Resuvastatin20 mg daily.  Chronic pain.Continue home dose of oxycodone.  COPD.No current exacerbation. -Continue home when necessary albuterol and Spiriva.  Chronic constipation. Continue MiraLAX daily.  Depression.No acute exacerbation. -Continue home dose of bupropion 100 mg twice daily.   Dispo:  Anticipated discharge in approximately 1-2 day(s).   Lorella Nimrod, MD 10/11/2016, 11:03 AM Pager: [phone]

## 2016-10-11 NOTE — Progress Notes (Signed)
Pt slept well during the night, Vitals stable, no any sign of SOB and distress noted, no any complain of pain, will continue to monitor the patient. 

## 2016-10-11 NOTE — Progress Notes (Signed)
Medicine attending: I examined this patient today and I concur with the evaluation and management plan as recorded by resident physician Dr.Sumayya Amin. No acute clinical change.  Heart rates remained in the 120-130 range.  Monitor strips reviewed and show persistent atrial flutter.  Blood pressure borderline but stable with most systolic pressures 099 mm or above.  Midodrine 10 mg daily started on 5/24.  We are going to forge ahead with dose escalation of his metoprolol XL and increase to 50 mg twice daily.  He has been on a dose as high as 150 mg twice daily at time of his cardiology visit with Dr. Rayann Heman in March.  Along the way, Cardizem was discontinued and metoprolol dose was decreased. Impression: 1.  Refractory atrial arrhythmias 2.  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  CKD 3 5.  Cardiac cachexia.  Ongoing nausea.  Intermittent vomiting.  Suspect he may have some element of mesenteric vascular ischemia.  However, no abdominal pain.  No postprandial pain. Plan: Continue to titrate medications under direct observation

## 2016-10-12 MED ORDER — NICOTINE 7 MG/24HR TD PT24
7.0000 mg | MEDICATED_PATCH | Freq: Every day | TRANSDERMAL | Status: DC
  Administered 2016-10-12 – 2016-10-14 (×3): 7 mg via TRANSDERMAL
  Filled 2016-10-12 (×4): qty 1

## 2016-10-12 MED ORDER — GI COCKTAIL ~~LOC~~: 30.0000 mL | Freq: Three times a day (TID) | ORAL | Status: DC | PRN

## 2016-10-12 MED ORDER — FAMOTIDINE 20 MG PO TABS
20.0000 mg | ORAL_TABLET | ORAL | Status: DC
  Administered 2016-10-12 – 2016-10-14 (×3): 20 mg via ORAL
  Filled 2016-10-12 (×3): qty 1

## 2016-10-12 NOTE — Progress Notes (Signed)
   Subjective: Pt reports feeling well this morning. Denies having any CP or SOB. Does state he felt dizzy when sitting up in bed yesterday, although ambulated to the bathroom with no problem. Does report feeling nauseous this morning; no vomiting. Was able to eat his breakfast. No abdominal pain.   Objective:  Vital signs in last 24 hours: Vitals:   10/11/16 1220 10/11/16 2139 10/11/16 2319 10/12/16 0519  BP: 102/77 95/78 105/76 98/77  Pulse: (!) 121 (!) 115 (!) 116 (!) 124  Resp: 20 16  14   Temp: 98.4 F (36.9 C) 98.1 F (36.7 C)  98.1 F (36.7 C)  TempSrc: Oral Oral  Oral  SpO2: 100% 99%  96%  Weight:    136 lb 12.8 oz (62.1 kg)  Height:       Gen. Thin, emaciated man,in no acute distress. CV.Tachycardic. S1 and S2 appreciated.  Lungs..Lungs CTAB.  Abdomen.Soft, nontender,bowel sounds positive. Extremities.No edema, no cyanosis.Pulses 2+ bilaterally.  Assessment/Plan:  Yida Hyams Hookeris a 71 y.o.gentlemanwith PMH Afib with tachybradycardia syndrome s/ppacemaker placement, chronic combined systolic and diastolic congestive heart failure (EF 30% 07/2015), Gold stage II COPD, chronic pain, CKD,and depression who presents for fatigue and hypotension.  Orthostatic hypotension and persistent fatigue. Most likely because of his persistent atrial flutter. In addition, dose of metoprolol was increased yesterday which could also be contributing to his orthostatic hypotension. His blood pressure continues to be soft.  -Continue current dose of midodrine 5 mg 3 times a day -PT consult   A. fib with tachycardia bradycardia syndrome. He continues have A flutter with HR in 120s. Dose of metoprolol was increased yesterday.  -Continue Toprol XL 50 mg twice daily. Not able to increase dose at this time due to soft BPs.  -Continue Eliquis 2.5 mg daily. -Continue aspirin 81 mg daily.  Failure to thrive.Negative extensive workup in the past. -Continue Marinol 10 mg BID. -Dietitian  are recommending addition Ensure.  Nausea: Does have a history of GERD.  -Trial of Pepcid prn and GI cocktail   Hyperlipidemia.Continue home dose of Resuvastatin20 mg daily.  Chronic pain.Continue home dose of oxycodone.  COPD.No current exacerbation. -Continue home when necessary albuterol and Spiriva.  Chronic constipation. Continue MiraLAX daily.  Depression.No acute exacerbation. -Continue home dose of bupropion 100 mg twice daily.   Dispo: Anticipated discharge in approximately 1-2 day(s).   Shela Leff, MD 10/12/2016, 8:42 AM Pager: [phone]

## 2016-10-12 NOTE — Plan of Care (Signed)
Problem: Safety: Goal: Ability to remain free from injury will improve Outcome: Progressing No incidence of falls during this admission. Call bell within reach. Bed in low and locked position. Patient alert and oriented. Clean and clear environment maintained. Patient verbalized understanding of safety instruction. Nonskid footwear being utilized. 3/4 siderails in place.  Problem: Fluid Volume: Goal: Ability to maintain a balanced intake and output will improve Outcome: Progressing Patient tolerating intake and urine output is adequate.

## 2016-10-13 DIAGNOSIS — I5042 Chronic combined systolic (congestive) and diastolic (congestive) heart failure: Secondary | ICD-10-CM

## 2016-10-13 DIAGNOSIS — Z950 Presence of cardiac pacemaker: Secondary | ICD-10-CM

## 2016-10-13 DIAGNOSIS — F329 Major depressive disorder, single episode, unspecified: Secondary | ICD-10-CM

## 2016-10-13 DIAGNOSIS — R5383 Other fatigue: Secondary | ICD-10-CM

## 2016-10-13 DIAGNOSIS — K5909 Other constipation: Secondary | ICD-10-CM

## 2016-10-13 DIAGNOSIS — Z7951 Long term (current) use of inhaled steroids: Secondary | ICD-10-CM

## 2016-10-13 DIAGNOSIS — I495 Sick sinus syndrome: Secondary | ICD-10-CM

## 2016-10-13 DIAGNOSIS — Z79891 Long term (current) use of opiate analgesic: Secondary | ICD-10-CM

## 2016-10-13 DIAGNOSIS — R627 Adult failure to thrive: Secondary | ICD-10-CM

## 2016-10-13 DIAGNOSIS — N189 Chronic kidney disease, unspecified: Secondary | ICD-10-CM

## 2016-10-13 NOTE — Progress Notes (Signed)
PT Cancellation Note  Patient Details Name: [name] MRN: [id number] DOB: [birth year]   Cancelled Treatment:    Reason Eval/Treat Not Completed: Patient declined, no reason specified . Pt reports that he does not want to get up this AM. Requesting PT come back after lunch. Will check back as time allows. If not this afternoon, tomorrow.   Scheryl Marten PT, DPT  [phone]  10/13/2016, 9:40 AM

## 2016-10-13 NOTE — Progress Notes (Signed)
  Date: 10/13/2016  Patient name: [name]  Medical record number: [id number]  Date of birth: February 10, 1946   I have seen and evaluated this patient and I have discussed the plan of care with the house staff. Please see their note for complete details. I concur with their findings with the following additions/corrections: [name] was seen on morning rounds. He is feeling better today overall that has limited his activity to stand and not by the bedside and sitting on a chair. He did not work with physical therapy this morning and requested to come back later. He has A. fib/flutter with RVR and has failed rhythm control. He gets symptomatic when he is tachycardic. Rate control with beta blockers and calcium channel blockers are limited by symptomatic hypo-tension. He was started on Midodrine to support his blood pressure so that we can titrate up his metoprolol a little bit. We will need to sacrifice strict rate control in order to support his blood pressure and if today is an indication of the immediate future, this current dose of midodrine and metoprolol are probably best that were going to be able to achieve. His cardiologist had referred him to see for further recommendations regarding rhythm control but he was not able to keep that appointment. He is involved in [facility] and and we will refer him to palliative care as an outpatient.  [name], MD 10/13/2016, 10:39 AM

## 2016-10-13 NOTE — Progress Notes (Signed)
   Subjective: Pt reports feeling well this morning. Denies having any CP or SOB. Denies experiencing any dizziness when ambulating within the room. He is eating his meals. No other complaints.    Objective:  Vital signs in last 24 hours: Vitals:   10/12/16 0928 10/12/16 1300 10/12/16 1942 10/13/16 0515  BP: 105/74 96/78 94/78  105/73  Pulse: 88  (!) 107 98  Resp:  18 18 18   Temp:  98 F (36.7 C) 98.8 F (37.1 C) 98.1 F (36.7 C)  TempSrc:  Oral Oral Oral  SpO2:  96% 100% 100%  Weight:    138 lb (62.6 kg)  Height:       Gen. Thin, emaciated man,in no acute distress. CV.Tachycardic. Irregular rhythm. S1 and S2 appreciated.  Lungs..Lungs CTAB.  Abdomen.Soft, nontender,bowel sounds positive. Extremities.No edema, no cyanosis.Pulses 2+ bilaterally.  Assessment/Plan:  [name] Hookeris a 71 y.o.gentlemanwith PMH Afib with tachybradycardia syndrome s/ppacemaker placement, chronic combined systolic and diastolic congestive heart failure (EF 30% 07/2015), Gold stage II COPD, chronic pain, CKD,and depression who presents for fatigue and hypotension.  Orthostatic hypotension and persistent fatigue. Most likely because of his persistent atrial flutter. BP improved today, 105/73 this morning.   -Continue current dose of midodrine 5 mg 3 times a day -PT eval pending   A. fib with tachycardia bradycardia syndrome. HR continues to be in the 120s with intermittent episodes of Aflutter. Dose of metoprolol was increased 5/25. This is a chronic problem for the patient. It has been difficult to control his HR in the setting of low BPs. Goal is symptom control. Patient seems to be experiencing symptoms when his BP is low. As such, would avoid increasing the dose of metoprolol at this time.  -Continue Toprol XL 50 mg twice daily -Continue Eliquis 2.5 mg daily. -Continue aspirin 81 mg daily. -Outpatient palliative care consult   Failure to thrive.Negative extensive workup in the  past. -Continue Marinol 10 mg BID. -Ensure and Boost supplementation  -Will advise his PCP to set him up for meals on wheels   Chronic pain.Continue home dose of oxycodone.  COPD.No current exacerbation. -Continue home when necessary albuterol and Spiriva.  Chronic constipation. Continue MiraLAX daily.  Depression.Stable.  -Continue home dose of bupropion 100 mg twice daily.  Dispo: Anticipated discharge in approximately 1-2 day(s).   Shela Leff, MD 10/13/2016, 8:59 AM Pager: [phone]

## 2016-10-13 NOTE — Progress Notes (Signed)
Pt requesting Breeze drink wild berry flavor. Called pharmacy and ordered. Will be delivered shortly   [name]

## 2016-10-14 MED ORDER — MIDODRINE HCL 5 MG PO TABS: 5.0000 mg | ORAL_TABLET | Freq: Three times a day (TID) | ORAL | 0 refills | Status: DC

## 2016-10-14 MED ORDER — DIGOXIN 125 MCG PO TABS
0.0625 mg | ORAL_TABLET | Freq: Every day | ORAL | Status: DC
  Administered 2016-10-14: 0.0625 mg via ORAL
  Filled 2016-10-14: qty 1

## 2016-10-14 MED ORDER — FAMOTIDINE 20 MG PO TABS: 20.0000 mg | ORAL_TABLET | Freq: Every day | ORAL | 0 refills | Status: DC

## 2016-10-14 MED ORDER — DIGOXIN 62.5 MCG PO TABS: 0.0625 mg | ORAL_TABLET | Freq: Every day | ORAL | 0 refills | Status: DC

## 2016-10-14 NOTE — Progress Notes (Signed)
Talked to MD in hallway and informed that pt refused assistance at discharge and evaluation with physical therapy. Gave MD pager for [place], Justin

## 2016-10-14 NOTE — Progress Notes (Signed)
Physical therapist at bedside. Pt screaming "get out of my room, I told you to get out." RN came to bedside, pt states he wants to go home and he will refuse any recommendations such as rehab or skilled facility. Pt educated on physical therapy evaluation and safety, pt kept screaming, "get out of my room"   Saori Umholtz Leory Plowman

## 2016-10-14 NOTE — Progress Notes (Signed)
MD called and asked to ambulate pt. Pt states he does not want to ambulate far. Pt is alert and oriented. Pt vital signs stable.  Pt ambulated to bathroom with his shoes on and no assistive devices, tolerated well.  Will inform MD   [name] [name]

## 2016-10-14 NOTE — Discharge Summary (Signed)
Name: [name] MRN: [id number] DOB: 1945-08-29 71 y.o. PCP: Zada Finders, MD  Date of Admission: 10/08/2016  3:16 PM Date of Discharge: 10/14/2016 Attending Physician: Annia Belt, MD  Discharge Diagnosis: 1.  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)   Hypotension   Near syncope   Discharge Medications: Allergies as of 10/14/2016      Reactions   Amiodarone Other (See Comments)   Thyroiditis in the setting of amiodarone use   Penicillins Rash   Has patient had a PCN reaction causing immediate rash, facial/tongue/throat swelling, SOB or lightheadedness with hypotension: Yes Has patient had a PCN reaction causing severe rash involving mucus membranes or skin necrosis: No Has patient had a PCN reaction that required hospitalization No Has patient had a PCN reaction occurring within the last 10 years: No If all of the above answers are "NO", then may proceed with Cephalosporin use.      Medication List    TAKE these medications   albuterol 108 (90 Base) MCG/ACT inhaler Commonly known as:  VENTOLIN HFA Inhale 2 puffs into the lungs every 6 (six) hours as needed for wheezing or shortness of breath.   apixaban 2.5 MG Tabs tablet Commonly known as:  ELIQUIS Take 1 tablet (2.5 mg total) by mouth 2 (two) times daily.   aspirin 81 MG chewable tablet Chew 1 tablet (81 mg total) by mouth daily.   buPROPion 100 MG 12 hr tablet Commonly known as:  WELLBUTRIN SR Take 1 tablet (100 mg total) by mouth 2 (two) times daily.   Digoxin 62.5 MCG Tabs Take 0.0625 mg by mouth daily. Start taking on:  10/15/2016   dronabinol 10 MG capsule Commonly known as:  MARINOL Take 1 capsule (10 mg total) by mouth 2 (two) times daily before lunch and supper.   famotidine 20 MG tablet Commonly known as:  PEPCID Take 1 tablet  (20 mg total) by mouth daily.   feeding supplement Liqd Take 1 Container by mouth 3 (three) times daily between meals.   metoprolol succinate 50 MG 24 hr tablet Commonly known as:  TOPROL-XL Take 1 tablet (50 mg total) by mouth 2 (two) times daily. Take with or immediately following a meal.   midodrine 5 MG tablet Commonly known as:  PROAMATINE Take 1 tablet (5 mg total) by mouth 3 (three) times daily with meals.   oxyCODONE-acetaminophen 10-325 MG tablet Commonly known as:  PERCOCET Take 1 tablet by mouth every 4 (four) hours as needed for pain. Do not take more than 5 tablets in 24 hours.   polyethylene glycol packet Commonly known as:  MIRALAX / GLYCOLAX Take 17 g by mouth daily.   rosuvastatin 20 MG tablet Commonly known as:  CRESTOR Take 1 tablet (20 mg total) by mouth daily at 6 PM.   tiotropium 18 MCG inhalation capsule Commonly known as:  SPIRIVA Place 1 capsule (18 mcg total) into inhaler and inhale daily.       Disposition and follow-up:   [name] was discharged from [facility] in Good condition.  At the hospital follow up visit please address:  1.  His blood pressure and heart rate. -He was started on digoxin. -His metoprolol was increased to 50 mg twice daily. -He was also started on midodrine to tolerate increase in metoprolol for  better heart rate control.  2.  Labs / imaging needed at time of follow-up: Digoxin level, BMP, EKG.  3.  Pending labs/ test needing follow-up: None  Follow-up Appointments: Follow-up Information    Zada Finders, MD. Schedule an appointment as soon as possible for a visit.   Specialty:  Internal Medicine Why:  To be seen within one week. Contact information: [address] [place] [address] [place] [facility] Course by problem list: [name] a 71 y.o.gentlemanwith PMH Afib with tachybradycardia syndrome s/ppacemaker placement, chronic combined systolic and  diastolic congestive heart failure (EF 30% 07/2015), Gold stage II COPD, chronic pain, CKD,and depression who presents for fatigue and hypotension.  Orthostatic hypotension and persistent fatigue. His orthostatic vitals were positive on presentation. He was started on midodrine, his blood pressure and dizziness improved. He was discharged home on midodrine 5 mg 3 times a day.  A. fib with tachycardia bradycardia syndrome. ON presentation he was having atrial flutter with tachycardia. He continued to have intermittent episodes of atrial flutter. He has an history of refractory atrial arrhythmias, he has failed most of the medical management, unable to have an ablation procedure because of occlusion of femoral vein. He was referred to [facility] for a possible transhepatic ablation, patient was unable to keep up with his appointment. Cardiology was consulted-day advised increase his metoprolol to twice daily, they also advise midodrine to support his blood pressure. He was also started on digoxin 0.0625 mg daily. According to cardiology he has developed cardiac cachexia, and his scope of treatment is very limited at this point. He was discharged home on Toprol 50 mg twice daily, and Eliquis 5 mg twice daily and aspirin, along with new addition of digoxin. His rate was controlled on discharge.  Failure to thrive. He has a negative extensive workup in the past. There was some improvement in his weight with a gain of 7 pound since last admission. We continued his home dose of Marinol. He was also advised to supplement his diet with boost or Ensure.  Hyperlipidemia.Continue home dose of Resuvastatin20 mg daily.  Chronic pain.Continue home dose of oxycodone.  COPD.No current exacerbation. Continue home when necessary albuterol and Spiriva.  Chronic constipation. Continue MiraLAX daily.  Depression.No acute exacerbation. Continue home dose of bupropion 100 mg twice daily.   Discharge  Vitals:   BP 105/78 (BP Location: Left Arm)   Pulse (!) 108   Temp 97.8 F (36.6 C) (Oral)   Resp 17   Ht 6\' 3"  (1.905 m)   Wt 126 lb 12.8 oz (57.5 kg) Comment: c scale  SpO2 98%   BMI 15.85 kg/m   Gen. Emaciatedman,in no acute distress. CV.Regular rate and rhythm. Lungs.. Clear bilaterally. Abdomen.Soft, nontender,bowel sounds positive. Extremities.No edema, no cyanosis.Pulses 2+ bilaterally.  Pertinent Labs, Studies, and Procedures:  CBC    Component Value Date/Time   WBC 7.0 10/08/2016 1642   RBC 4.72 10/08/2016 1642   HGB 14.9 10/08/2016 1642   HCT 44.5 10/08/2016 1642   PLT 120 (L) 10/08/2016 1642   MCV 94.3 10/08/2016 1642   MCH 31.6 10/08/2016 1642   MCHC 33.5 10/08/2016 1642   RDW 14.2 10/08/2016 1642   LYMPHSABS 2.3 10/08/2016 1642   MONOABS 0.6 10/08/2016 1642   EOSABS 0.6 10/08/2016 1642   BASOSABS 0.0 10/08/2016 1642   CMP     Component Value Date/Time   NA 140 10/08/2016 1642   NA 147 (  H) 07/19/2015 1546   K 4.9 10/08/2016 1642   CL 112 (H) 10/08/2016 1642   CO2 21 (L) 10/08/2016 1642   GLUCOSE 76 10/08/2016 1642   BUN 28 (H) 10/08/2016 1642   BUN 21 07/19/2015 1546   CREATININE 1.51 (H) 10/08/2016 1642   CREATININE 1.33 08/10/2014 1221   CALCIUM 8.9 10/08/2016 1642   PROT 6.2 (L) 10/08/2016 1642   PROT 7.0 06/16/2015 1624   ALBUMIN 3.6 10/08/2016 1642   ALBUMIN 4.0 07/19/2015 1546   AST 44 (H) 10/08/2016 1642   ALT 48 10/08/2016 1642   ALKPHOS 63 10/08/2016 1642   BILITOT 1.4 (H) 10/08/2016 1642   BILITOT 0.8 06/16/2015 1624   GFRNONAA 44 (L) 10/08/2016 1642   GFRNONAA 54 (L) 08/10/2014 1221   GFRAA 51 (L) 10/08/2016 1642   GFRAA 62 08/10/2014 1221   Urinalysis    Component Value Date/Time   COLORURINE YELLOW 10/08/2016 1701   APPEARANCEUR HAZY (A) 10/08/2016 1701   APPEARANCEUR Clear 06/20/2015 1405   LABSPEC 1.015 10/08/2016 1701   PHURINE 6.0 10/08/2016 1701   GLUCOSEU NEGATIVE 10/08/2016 1701   GLUCOSEU NEG mg/dL  12/04/2009 2201   HGBUR NEGATIVE 10/08/2016 1701   HGBUR trace-intact 09/01/2008 1533   BILIRUBINUR NEGATIVE 10/08/2016 1701   BILIRUBINUR Negative 06/20/2015 1405   KETONESUR NEGATIVE 10/08/2016 1701   PROTEINUR NEGATIVE 10/08/2016 1701   UROBILINOGEN 1.0 11/11/2014 1518   NITRITE NEGATIVE 10/08/2016 1701   LEUKOCYTESUR TRACE (A) 10/08/2016 1701   LEUKOCYTESUR Negative 06/20/2015 1405   Urine culture  Order: [id number]  Status:  Final result Visible to patient:  Yes (MyChart) Next appt:  10/30/2016 at 03:45 PM in Internal Medicine Zada Finders, MD)   7d ago  Specimen Description URINE, RANDOM   Special Requests ADDED 0114 10/09/16   Culture <10,000 COLONIES/mL INSIGNIFICANT GROWTH    Report Status 10/10/2016 FINAL         BNP. 232.7 Troponin.0.02  Discharge Instructions: Discharge Instructions    Diet - low sodium heart healthy    Complete by:  As directed    Discharge instructions    Complete by:  As directed    It was pleasure taking care of you. We are adding a new medicine called digoxin-you were taking that a little while ago, with thing that will help with your heart function. Please take all your medications as directed. Please follow-up in clinic in 1 week.   Increase activity slowly    Complete by:  As directed       Signed: Lorella Nimrod, MD 10/14/2016, 3:37 PM   Pager: [phone]

## 2016-10-14 NOTE — Progress Notes (Signed)
Pt discharge education provided at bedside. Pt IV discontinued, catheter intact and telemetry removed. Pt has all belongings including discharge paperwork. Pt discharged via wheelchair with nurse tech  Florette Thai

## 2016-10-14 NOTE — Progress Notes (Signed)
PT Cancellation Note  Patient Details Name: [name] MRN: [id number] DOB: 12/11/1945   Cancelled Treatment:    Reason Eval/Treat Not Completed: Other (comment) (Patient refusing PT, will sign off. ) Patient refusing therapy evaluation. Will sign off.    Duncan Dull 10/14/2016, 9:09 AM Alben Deeds, PT DPT  [phone]

## 2016-10-14 NOTE — Progress Notes (Signed)
MD informed of pt vital signs and ambulation efforts. Awaiting discharge orders  Francille Wittmann Leory Plowman

## 2016-10-14 NOTE — Progress Notes (Signed)
   Subjective: Patient was feeling better this morning. He refused to participate with physical therapist, stating that they disturb him during breakfast. He also stated that he will not do any physical therapy with the same physical therapist who came this morning, he was agreeable to walk with someone else either a nurse or a different physical therapist.  Objective:  Vital signs in last 24 hours: Vitals:   10/14/16 0811 10/14/16 0944 10/14/16 1155 10/14/16 1248  BP: 105/80  103/70   Pulse: 93  (!) 122 (!) 115  Resp: 16  18   Temp:   97.8 F (36.6 C)   TempSrc: Oral  Oral   SpO2: 98% 99% 96%   Weight:      Height:       Gen.  Emaciated man,in no acute distress. CV.Regular rate and rhythm. Lungs.. Clear bilaterally. Abdomen.Soft, nontender,bowel sounds positive. Extremities.No edema, no cyanosis.Pulses 2+ bilaterally.  Assessment/Plan:  Iram Lundberg Hookeris a 71 y.o.gentlemanwith PMH Afib with tachybradycardia syndrome s/ppacemaker placement, chronic combined systolic and diastolic congestive heart failure (EF 30% 07/2015), Gold stage II COPD, chronic pain, CKD,and depression who presents for fatigue and hypotension.  Orthostatic hypotension and persistent fatigue. His blood pressure improved with midodrine. Patient refusing to participate with physical therapist, but told us that he is able to walk without being dizzy. -Continue midodrine 5 mg 3 times a day.  A. fib with tachycardia bradycardia syndrome.He continued to have intermittent episodes of atrial flutter. His heart rate and blood pressure has been improved since admission. -Start him on digoxin 0.0625 milligrams daily. -Continue Toprol 50 mg twice daily. -Continue Eliquis -Continue aspirin  Failure to thrive.Negative extensive workup in the past. -Continue Marinol 10 mg BID. -Dietitian are recommending addition Ensure.  Hyperlipidemia.Continue home dose of Resuvastatin20 mg daily.  Chronic  pain.Continue home dose of oxycodone.  COPD.No current exacerbation. -Continue home when necessary albuterol and Spiriva.  Chronic constipation. Continue MiraLAX daily.  Depression.No acute exacerbation. -Continue home dose of bupropion 100 mg twice daily.  Dispo: Can be discharged home today after walking with nurse.  Lorella Nimrod, MD 10/14/2016, 2:24 PM Pager: [phone]

## 2016-10-14 NOTE — Care Management Note (Signed)
Case Management Note  Patient Details  Name: DAVONNE JARNIGAN MRN: [id number] Date of Birth: Feb 01, 1946  Subjective/Objective:   CM following for progression and d/c planning.                  Action/Plan: Noted order for HHPT and [place] aide, pt is declining HHPT, this CM added HHRN for more frequent in home visits for eval and assessment of pt ambulation and progression. [facility] will continue services as previously.   Expected Discharge Date:  10/14/16               Expected Discharge Plan:  [place] (Has [facility] through [facility])  In-House Referral:     Discharge planning Services  CM Consult  Post Acute Care Choice:  Home Health Choice offered to:     DME Arranged:  N/A DME Agency:  NA  HH Arranged:  RN, Nurse's Aide [place] Agency:  [place], [place]  Status of Service:  Completed, signed off  If discussed at [employer], dates discussed:    Additional Comments:  Adron Bene, RN 10/14/2016, 4:37 PM

## 2016-10-15 ENCOUNTER — Telehealth: Payer: Self-pay

## 2016-10-15 NOTE — Telephone Encounter (Signed)
Asking Helen to call back.  

## 2016-10-16 ENCOUNTER — Ambulatory Visit (INDEPENDENT_AMBULATORY_CARE_PROVIDER_SITE_OTHER): Payer: Medicare HMO | Admitting: Internal Medicine

## 2016-10-16 ENCOUNTER — Other Ambulatory Visit: Payer: Self-pay | Admitting: Internal Medicine

## 2016-10-16 ENCOUNTER — Other Ambulatory Visit: Payer: Self-pay | Admitting: *Deleted

## 2016-10-16 ENCOUNTER — Encounter: Payer: Self-pay | Admitting: Internal Medicine

## 2016-10-16 VITALS — BP 116/77 | HR 130 | Temp 98.0°F | Wt 131.4 lb

## 2016-10-16 DIAGNOSIS — F17210 Nicotine dependence, cigarettes, uncomplicated: Secondary | ICD-10-CM

## 2016-10-16 DIAGNOSIS — I481 Persistent atrial fibrillation: Secondary | ICD-10-CM

## 2016-10-16 DIAGNOSIS — G8929 Other chronic pain: Secondary | ICD-10-CM

## 2016-10-16 DIAGNOSIS — Z79891 Long term (current) use of opiate analgesic: Secondary | ICD-10-CM | POA: Diagnosis not present

## 2016-10-16 DIAGNOSIS — Z09 Encounter for follow-up examination after completed treatment for conditions other than malignant neoplasm: Secondary | ICD-10-CM

## 2016-10-16 DIAGNOSIS — M25569 Pain in unspecified knee: Secondary | ICD-10-CM

## 2016-10-16 DIAGNOSIS — I951 Orthostatic hypotension: Secondary | ICD-10-CM

## 2016-10-16 DIAGNOSIS — I4811 Longstanding persistent atrial fibrillation: Secondary | ICD-10-CM

## 2016-10-16 DIAGNOSIS — J449 Chronic obstructive pulmonary disease, unspecified: Secondary | ICD-10-CM | POA: Diagnosis not present

## 2016-10-16 DIAGNOSIS — N433 Hydrocele, unspecified: Secondary | ICD-10-CM

## 2016-10-16 DIAGNOSIS — Z5189 Encounter for other specified aftercare: Secondary | ICD-10-CM | POA: Diagnosis not present

## 2016-10-16 DIAGNOSIS — I5042 Chronic combined systolic (congestive) and diastolic (congestive) heart failure: Secondary | ICD-10-CM

## 2016-10-16 DIAGNOSIS — Z7901 Long term (current) use of anticoagulants: Secondary | ICD-10-CM

## 2016-10-16 DIAGNOSIS — M25559 Pain in unspecified hip: Secondary | ICD-10-CM

## 2016-10-16 DIAGNOSIS — Z950 Presence of cardiac pacemaker: Secondary | ICD-10-CM

## 2016-10-16 DIAGNOSIS — M549 Dorsalgia, unspecified: Secondary | ICD-10-CM | POA: Diagnosis not present

## 2016-10-16 DIAGNOSIS — F329 Major depressive disorder, single episode, unspecified: Secondary | ICD-10-CM

## 2016-10-16 DIAGNOSIS — N189 Chronic kidney disease, unspecified: Secondary | ICD-10-CM

## 2016-10-16 MED ORDER — OXYCODONE-ACETAMINOPHEN 10-325 MG PO TABS: 1.0000 | ORAL_TABLET | ORAL | 0 refills | Status: DC | PRN

## 2016-10-16 MED ORDER — METOPROLOL SUCCINATE ER 50 MG PO TB24: ORAL_TABLET | ORAL | 2 refills | Status: DC

## 2016-10-16 NOTE — Telephone Encounter (Signed)
Pt has appt with Sherrye Payor today at 2:15pm.  Message left on recorder that he can obtain rx at that visit.Regenia Skeeter, Domanique Huesman Cass[place]5/30/201810:07 AM

## 2016-10-16 NOTE — Progress Notes (Signed)
CC: Afib  HPI:  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  Hospital Follow up for Orthostatic Hypotension: He was admitted from 5/22-5/28 for orthostatic hypotension in the setting of poor oral intake and uncontrolled atrial fibrillation plus cardiac medications. Symptoms and orthostasis improved with IV fluids and addition of Midodrine 5 mg TID with meals for pressure support and to provide room to titrate up his Toprol XL. Patient states that he has not had any further dizziness, lightheadedness, or syncopal episodes since discharge and reports adherence to his medications without obvious side effect.  Atrial Fibrillation: Patient with chronically difficult to control Afib. Currently prescribed Toprol XL 50 mg twice daily (previously 150 mg twice daily). He has failed Amiodarone, Tikosyn, and Multaq in the past. Previously on Cardizem which was discontinued due to his low EF. Digoxin 0.0625 mg once daily was readded on his recent hospital admission. He was previously on digoxin which was discontinued in 2017 for unknown reasons. He is taking Eliquis 2.5 mg BID for anticoagulation. He denies any chest pain, palpitations, or obvious bleeding. He is tachycardic on arrival to clinic to the 130s.  Chronic Pain with long term current use of opiate analgesic: On chronic narcotics due to chronic back, hip, and knee pain. He takes Percocet 10-325 mg, 1 tablet every 4 hours as needed for pain, #150/month. He last renewed a pain contract with me in March 2017. Patient understands not to take his percocet if he has symptoms of  lightheadedness, dizziness, shortness of breath, or confusion. He denies any constipation.  Scrotal Swelling: Patient reports chronic scrotal swelling with recent sensation of scrotal pruritus without rash, pain, dysuria, or penile discharge. He had an US Scrotum in May 2017 for the same issue which showed a left hydrocele without evidence of testicular mass. He says he is not having significant discomfort.  Past Medical History:  Diagnosis Date  . Alcohol abuse   . Anxiety   . Avascular necrosis of bones of both hips (HCC)    Status post bilateral hip replacements  . Bipolar disorder ([place])   . CHF (congestive heart failure) ([place])   . Chronic systolic heart failure ([place])    a. NICM - EF 35% with normal cors 2003. b. Last echo 11/2012 - EF 25-30%.  . CKD (chronic kidney disease)    Renal U/S 12/04/2009 showed no pathological findings. Labs 12/04/2009 include normal ESR, C3, C4; neg ANA; SPEP showed nonspecific increase in the alpha-2 region with no M-spike; UPEP showed no monoclonal free light chains; urine IFE showed polyclonal increase in feree Kappa and/or free Lambda light chains. Baseline Cr reported 1.7-2.5.  [name] COPD (chronic obstructive pulmonary disease) ([place])   . Depression   . DVT (deep venous thrombosis) ([place])    He has hypercoagulability with multiple prior DVTs  . E coli bacteremia   . Erectile dysfunction   . Failure to thrive (0-17) 08/2016   DEHYDRATION  . Gastritis   . GERD (gastroesophageal reflux disease)   . HCAP (healthcare-associated pneumonia) 08/24/2015  . Hydrocele, unspecified   . Hyperlipemia   . Hypertension   . Hyperthyroidism    Likely due to thyroiditis with possible  amiodarone association.  Thyroid scan 08/28/2009 was normal with no focal areas of abnormal increased or decreased activity seen; the uptake of I 131 sodium iodide at 24 hours was 5.7%.  TSH and free T4 normalized by 08/16/2009.  . Intestinal obstruction ([place])   . Lung nodule    Chest CT scan on  12/14/2008 showed a nodular opacity at the left lung base felt to most likely represent scarring.  Follow-up chest CT scan on 06/02/2010 showed parenchymal scarring in the left apex, left  lower lobe, and lingula; some of this scarring at the left base had a nodular appearance, unchanged. Chest 09/2012 - stable.  . Noncompliance   . On home oxygen therapy    "3L periodically" (08/24/2015)  . Osteoarthritis   . Pleural plaque    H/o asbestos exposure. Chest CT on 06/02/2010 showed stable extensive calcified pleural plaques involving the left hemithorax, consistent with asbestos related pleural disease.  . Portal vein thrombosis   . Protein calorie malnutrition ([place]) 04/2016  . Spondylosis   . STEMI (ST elevation myocardial infarction) ([name]) 10/11/2014  . Tachycardia-bradycardia syndrome [facility])    a. 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 b. [employer] Jude gen change 2010.  [name] Thrombocytopenia ([place])   . 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  . Weight loss    Normal colonoscopy by Dr. Olevia Perches on 11/06/2010.    Review of Systems:   Review of Systems  Constitutional: Negative for chills, diaphoresis, fever and malaise/fatigue.  HENT: Negative for nosebleeds.   Respiratory: Negative for hemoptysis and shortness of breath.   Cardiovascular: Negative for chest pain, palpitations and leg swelling.  Gastrointestinal: Negative for abdominal pain, blood in stool, constipation, diarrhea and melena.  Genitourinary: Negative for dysuria and hematuria.  Musculoskeletal: Positive for back pain and joint pain. Negative for falls.  Neurological: Negative for dizziness and loss of consciousness.  Endo/Heme/Allergies: Does not bruise/bleed easily.     Physical Exam:  Vitals:   10/16/16 1426 10/16/16 1509  BP: 116/77   Pulse: (!) 135 (!) 130  Temp: 98 F (36.7 C)   TempSrc: Oral   SpO2: 98%   Weight: 131 lb  6.4 oz (59.6 kg)    Physical Exam  Constitutional: He is oriented to person, place, and time. No distress.  Chronically ill-appearing man  HENT:  Head: Normocephalic and atraumatic.  Cardiovascular:  No murmur heard. tachycardic  Pulmonary/Chest: Effort normal. No respiratory distress. He has no wheezes. He has no rales.  Genitourinary: Right testis shows swelling. Right testis shows no tenderness. Left testis shows swelling. Left testis shows no tenderness.  Genitourinary Comments: Hydrocele. No erythema, rash, or varicocele changes of scrotum.  Musculoskeletal: He exhibits no edema.  Neurological: He is alert and oriented to person, place, and time.  Skin: Skin is warm. He is not diaphoretic.    Assessment & Plan:   See Encounters Tab for problem based charting.  Patient discussed with Dr. Garth Bigness discharge follow-up Patient's orthostasis appears improved since discharge on Midodrine 5 mg TID with meals without recurrent symptoms of hypotension. BP was 116/77 this visit. We will continue Midodrine at its current dose and I have encouraged continued oral intake. He does have ensure at home as a feeding supplement. - Continue Midodrine 5 mg TID with meals.  Longstanding persistent atrial fibrillation ([place]) Patient continues to be tachycardic to the 130s on this visit. He has well-known  difficult to control Afib/Aflutter and not considered a candidate for ablation. His Toprol XL was reduced from 150 mg BID to 50 mg BID during recent hospital admissions, possibly due to low blood pressures. Given improved pressure support with Midodrine, I will increase his Toprol XL to 100 mg in the morning and continue 50 mg in the evening. We will continue Digoxin and renal dosed Eliquis. - Increase Toprol XL to 100 mg am and 50 mg pm - Continue Digoxin 0.0625 mg once daily - Check Digoxin level on follow up - Continue renal dosed Eliquis 2.5 mg po BID - f/u with me in 2 weeks  Long  term current use of opiate analgesic [name] is on chronic opioid therapy for chronic pain. The date of the controlled substances contract is referenced in the [place] and / or the overview. Date of pain contract was 07/19/15. As part of the treatment plan, the [place] controlled substance database is checked at least twice yearly and the database results are appropriate. I have last reviewed the results on 10/16/16.   The last UDS was on 06/27/15 and results are as expected. Patient needs at least a yearly UDS.   The patient is on Percocet strength 10-325 mg q4h prn, #150 per 30 days. This regimen allows [name] to function and does not cause excessive sedation or other side effects.  "The benefits of continuing opioid therapy outweigh the risks and chronic opioids will be continued. Ongoing education about safe opioid treatment is provided  Interventions today include: UDS and Refill - 1 paper Rx printed, renewed Pain Contract.  Chronic hydrocele Patient with chronic hydrocele. No history or physical exam findings to suggest varicocele, epididymitis, testicular torsion, fungal infection, or UTI. Advised continued monitoring at this time and if symptoms become significantly bothersome, we will consider Urology consultation. Patient understands and agrees.

## 2016-10-16 NOTE — Telephone Encounter (Signed)
Refill Request ° ° °oxyCODONE-acetaminophen (PERCOCET) 10-325 MG tablet °

## 2016-10-16 NOTE — Patient Instructions (Signed)
It was a pleasure to see you again [name].  Please increase your Toprol-XL to 100 mg in the morning and continue 50 mg in the evening.  I am refilling you pain medicine today and we renewed our pain contract. Please only take this medicine as needed and do not take it if you are feeling lightheaded, confused, or short of breath.  Your scrotal swelling is likely a hydrocele or fluid in the scrotum. We can continue to watch this unless it becomes increasingly bothersome.  Please follow up with me as scheduled.   Hydrocele, Adult A hydrocele is a collection of fluid in the loose pouch of skin that holds the testicles (scrotum). Usually, it affects only one testicle. What are the causes? This condition may be caused by:  An injury to the scrotum.  An infection.  A tumor or cancer of the testicle.  Twisting of a testicle.  Decreased blood flow to the scrotum. What are the signs or symptoms? A hydrocele feels like a water-filled balloon. It may also feel heavy. A hydrocele can cause:  Swelling of the scrotum. The swelling may decrease when you lie down.  Swelling of the groin.  Mild discomfort in the scrotum.  Pain. This can develop if the hydrocele was caused by infection or twisting. How is this diagnosed? This condition may be diagnosed with a medical history, physical exam, and imaging tests. You may also have blood and urine tests to check for infection. How is this treated? Treatment may include:  Watching and waiting, particularly if the hydrocele causes no symptoms.  Treatment of the underlying condition. This may include using antibiotic medicine.  Surgery to drain the fluid. Some surgical options include:  Needle aspiration. For this procedure, a needle is used to drain fluid.  Hydrocelectomy. For this procedure, an incision is made in the scrotum to remove the fluid sac. Follow these instructions at home:  Keep all follow-up visits as told by your health  care provider. This is important.  Watch the hydrocele for any changes.  Take over-the-counter and prescription medicines only as told by your health care provider.  If you were prescribed an antibiotic medicine, use it as told by your health care provider. Do not stop using the antibiotic even if your condition improves. Contact a health care provider if:  The swelling in your scrotum or groin gets worse.  The hydrocele becomes red, firm, tender to the touch, or painful.  You notice any changes in the hydrocele.  You have a fever. This information is not intended to replace advice given to you by your health care provider. Make sure you discuss any questions you have with your health care provider. Document Released: 10/24/2009 Document Revised: 10/12/2015 Document Reviewed: 05/02/2014 Elsevier Interactive Patient Education  2017 [employer].

## 2016-10-16 NOTE — Patient Outreach (Signed)
[facility]) Care Management  10/16/2016  [name] February 13, 1946 [id number]   Noted that member discharged from hospital Monday, 5/28.  Call placed to restart transition of care program, no answer.  HIPAA compliant voice message left.  Will await call back.  If no call back, will follow up next week.  [name], [place], MSN [place] [phone]

## 2016-10-16 NOTE — Telephone Encounter (Signed)
Has appt today

## 2016-10-17 ENCOUNTER — Other Ambulatory Visit: Payer: Self-pay | Admitting: *Deleted

## 2016-10-17 DIAGNOSIS — Z09 Encounter for follow-up examination after completed treatment for conditions other than malignant neoplasm: Secondary | ICD-10-CM | POA: Insufficient documentation

## 2016-10-17 NOTE — Assessment & Plan Note (Signed)
Patient continues to be tachycardic to the 130s on this visit. He has well-known difficult to control Afib/Aflutter and not considered a candidate for ablation. His Toprol XL was reduced from 150 mg BID to 50 mg BID during recent hospital admissions, possibly due to low blood pressures. Given improved pressure support with Midodrine, I will increase his Toprol XL to 100 mg in the morning and continue 50 mg in the evening. We will continue Digoxin and renal dosed Eliquis. - Increase Toprol XL to 100 mg am and 50 mg pm - Continue Digoxin 0.0625 mg once daily - Check Digoxin level on follow up - Continue renal dosed Eliquis 2.5 mg po BID - f/u with me in 2 weeks

## 2016-10-17 NOTE — Assessment & Plan Note (Signed)
Patient's orthostasis appears improved since discharge on Midodrine 5 mg TID with meals without recurrent symptoms of hypotension. BP was 116/77 this visit. We will continue Midodrine at its current dose and I have encouraged continued oral intake. He does have ensure at home as a feeding supplement. - Continue Midodrine 5 mg TID with meals.

## 2016-10-17 NOTE — Patient Outreach (Signed)
[facility]) Care Management  10/17/2016  [name] [birth year] [id number]   Voice message received from member after unsuccessful call yesterday.  Call placed back to member to follow up on hospital discharge, no answer.  HIPAA voice message left.  Will await call back, if no call back, will follow up next week.  [name], [place], MSN [place] [phone]

## 2016-10-17 NOTE — Assessment & Plan Note (Signed)
Patient with chronic hydrocele. No history or physical exam findings to suggest varicocele, epididymitis, testicular torsion, fungal infection, or UTI. Advised continued monitoring at this time and if symptoms become significantly bothersome, we will consider Urology consultation. Patient understands and agrees.

## 2016-10-17 NOTE — Assessment & Plan Note (Addendum)
[name] is on chronic opioid therapy for chronic pain. The date of the controlled substances contract is referenced in the [place] and / or the overview. Date of pain contract was 07/19/15. As part of the treatment plan, the [place] controlled substance database is checked at least twice yearly and the database results are appropriate. I have last reviewed the results on 10/16/16.   The last UDS was on 06/27/15 and results are as expected. Patient needs at least a yearly UDS.   The patient is on Percocet strength 10-325 mg q4h prn, #150 per 30 days. This regimen allows KENSLEY LARES to function and does not cause excessive sedation or other side effects.  "The benefits of continuing opioid therapy outweigh the risks and chronic opioids will be continued. Ongoing education about safe opioid treatment is provided  Interventions today include: UDS and Refill - 1 paper Rx printed, renewed Pain Contract.

## 2016-10-18 ENCOUNTER — Other Ambulatory Visit: Payer: Self-pay | Admitting: *Deleted

## 2016-10-18 NOTE — Patient Outreach (Signed)
[name]) Care Management  10/18/2016  [name] [birth year] [id number]   Call placed to follow up on discharge.  He report he is "much better."  He state that he has been taking medications as prescribed, including newly increased dose of Metoprolol.  Patient was recently discharged from hospital and all medications have been reviewed.  He report blood pressure today being 100s/70s, heart rate down to 102.  He report that his weight is up to 138 pounds today.  He state that his appetite has increased, mobile meal delivery restarted on Wednesday.  He continues to drink 2-3 ensures daily, report having enough left for about 1.5 weeks.  Will discuss update with assistant clinical director, [name], and inquire about possible plan to continue providing resources.    He confirms that he did attend follow up with primary MD this week, has another appointment on 6/13.  He was able to transport himself, denies dizziness.  He denies any questions at this time, advised to contact with concerns.  Advised to continue daily blood pressure & heart rate checks and documenting readings.  Will follow up next week.  [name], [name], [name] [phone]

## 2016-10-19 DIAGNOSIS — N19 Unspecified kidney failure: Secondary | ICD-10-CM | POA: Diagnosis not present

## 2016-10-19 DIAGNOSIS — J129 Viral pneumonia, unspecified: Secondary | ICD-10-CM | POA: Diagnosis not present

## 2016-10-19 DIAGNOSIS — I4891 Unspecified atrial fibrillation: Secondary | ICD-10-CM | POA: Diagnosis not present

## 2016-10-19 DIAGNOSIS — J449 Chronic obstructive pulmonary disease, unspecified: Secondary | ICD-10-CM | POA: Diagnosis not present

## 2016-10-19 LAB — TOXASSURE SELECT,+ANTIDEPR,UR

## 2016-10-19 IMAGING — DX DG CHEST 2V
2 series · 3 of 3 positions shown · non-contrast
Comparison: 06/16/2015

CLINICAL DATA: Shortness of breath, productive cough, tachycardia,
chest pain, weakness, dizziness

EXAM:
CHEST  2 VIEW

[chest lat]
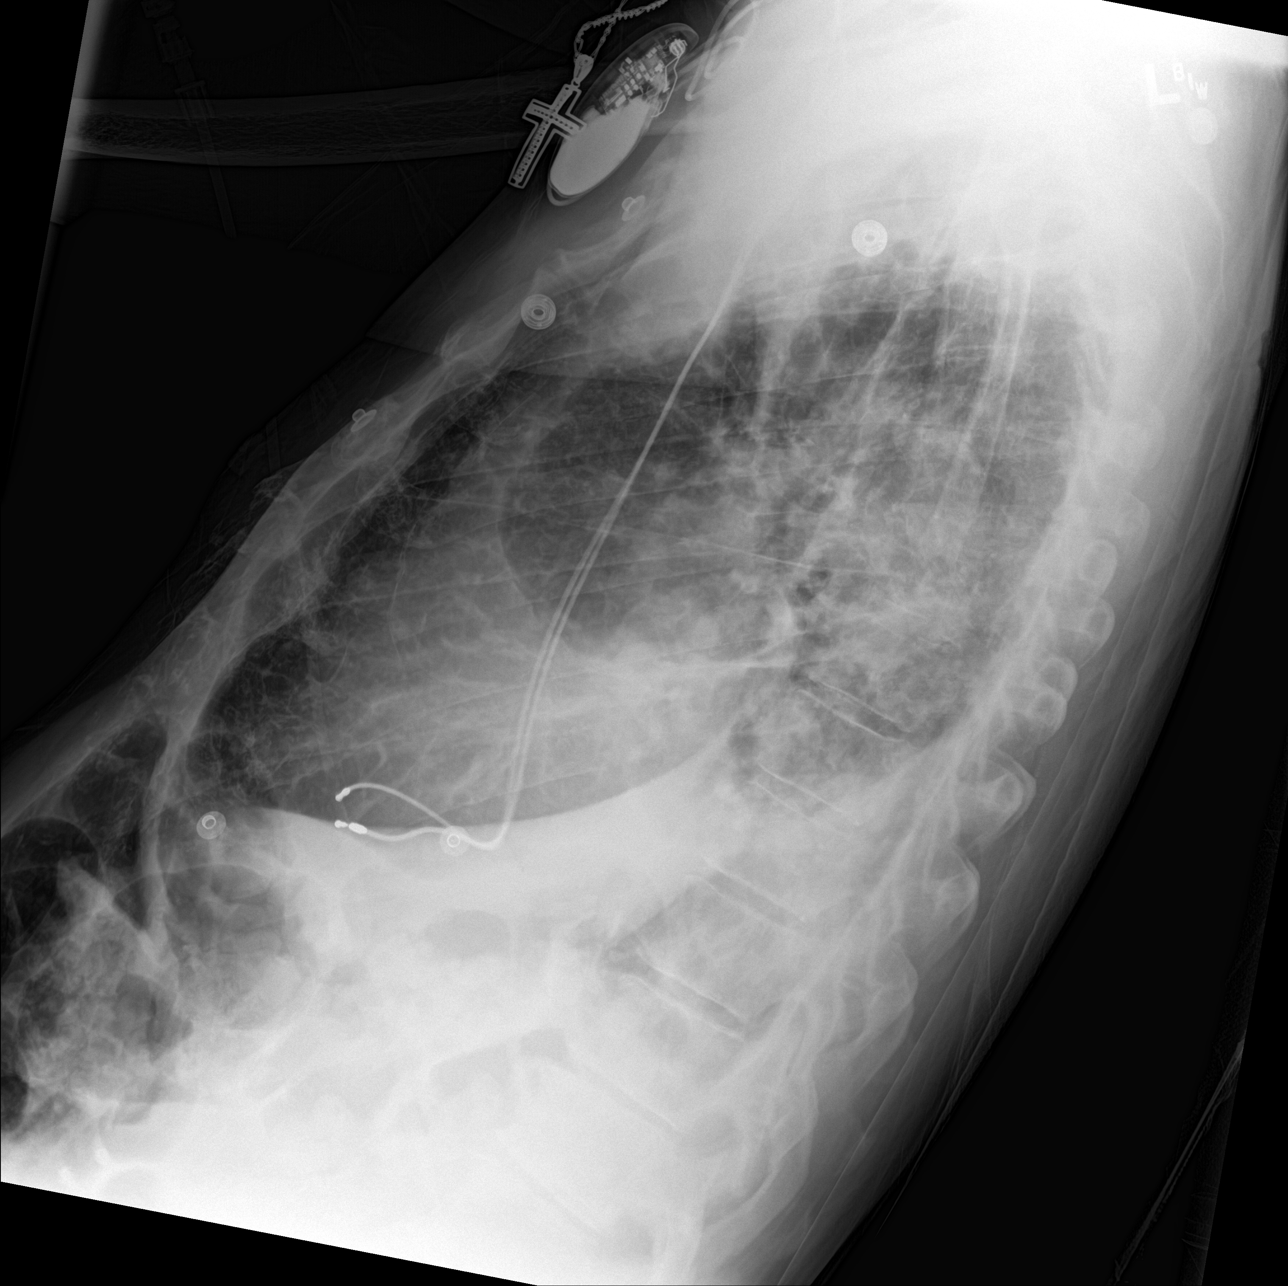

[Series 3: chest ap · 0.14mm/px · 2 of 2 slices shown]
[im 1/2]
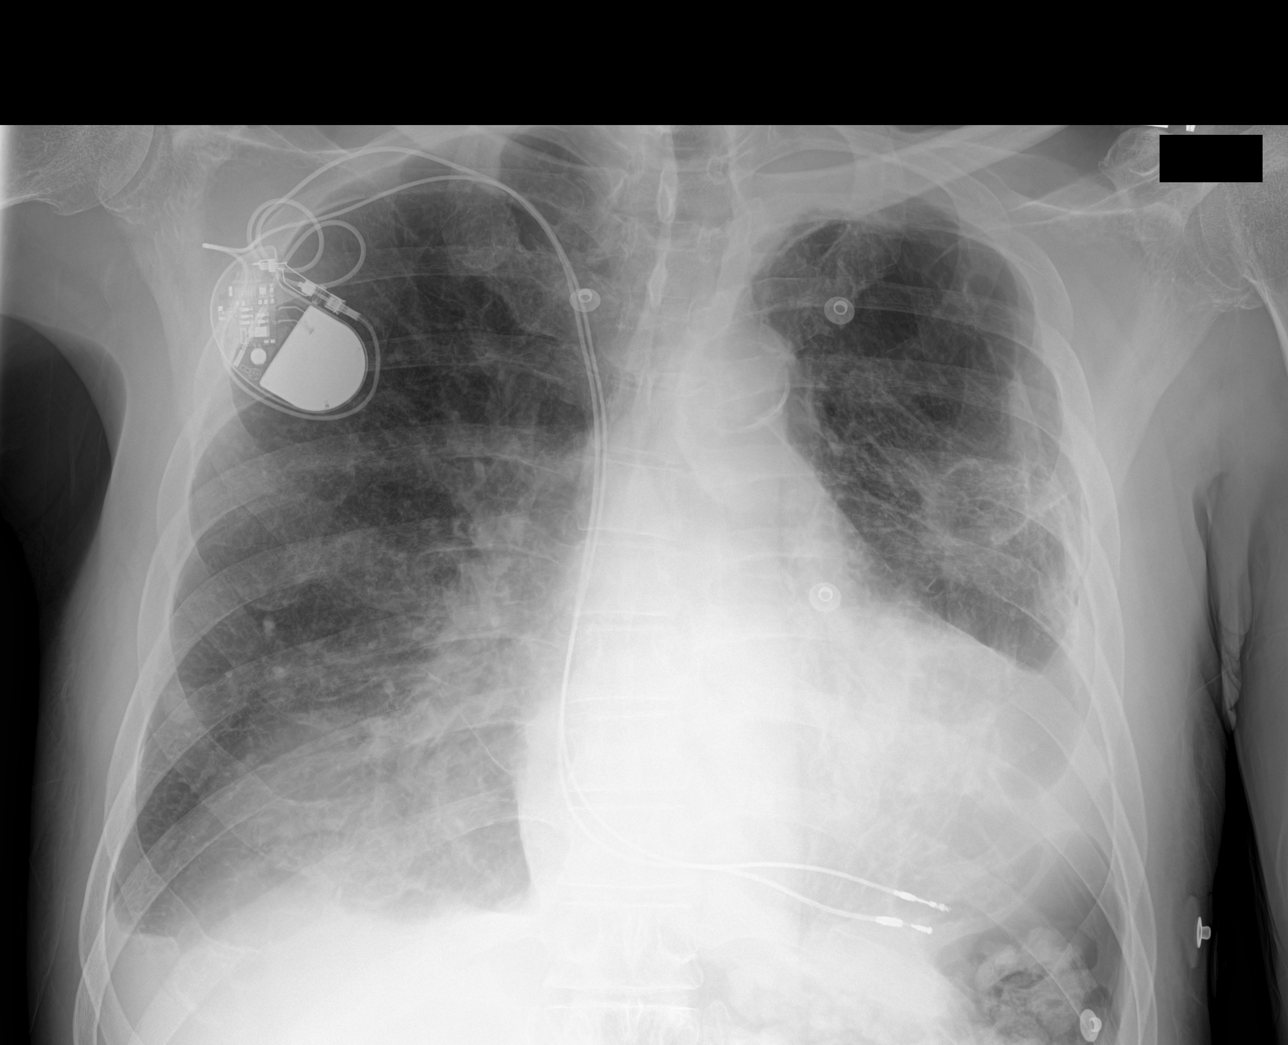
[im 2/2]
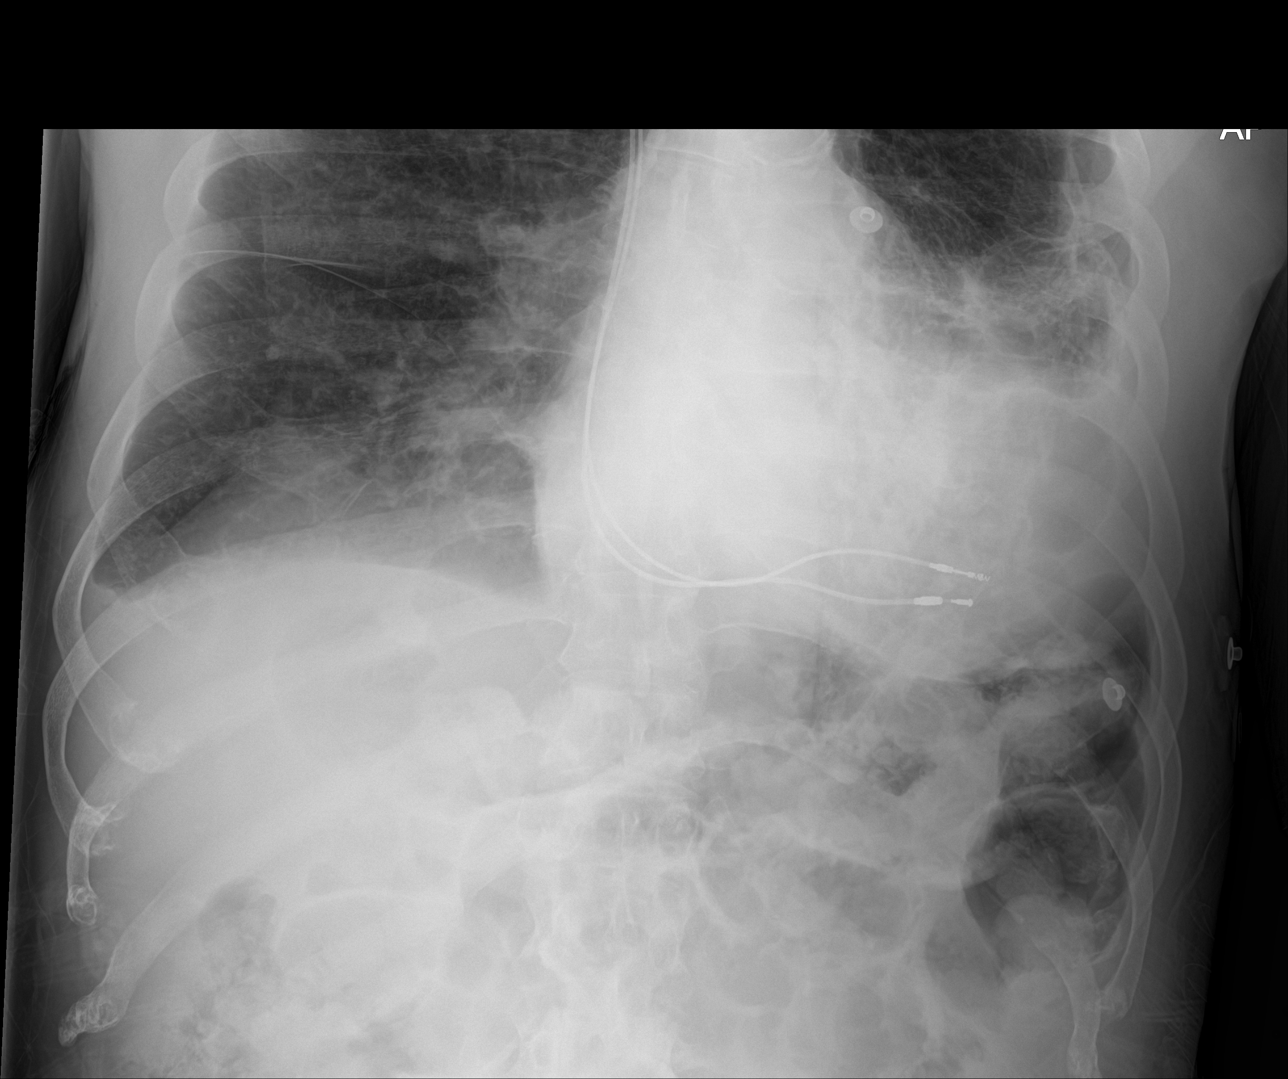

[3 of 3 positions shown; findings below may reference images not displayed]

FINDINGS: Mild patchy right lower lobe opacity, suspicious for pneumonia, less
likely atelectasis. Associated small right pleural effusion.

Stable scarring in the left mid lung and left lung base. Volume loss
in the left hemithorax. Small left pleural effusion with apical
capping.

No pneumothorax.

The heart is top-normal in size.  Right subclavian pacemaker.
IMPRESSION: Mild patchy right lower lobe opacity, suspicious for pneumonia, less
likely atelectasis.

Associated small bilateral pleural effusions.

Chronic left lung scarring with volume loss.

## 2016-10-20 IMAGING — CR DG CHEST 1V PORT
1 series · 1 of 1 positions shown · non-contrast
Comparison: July 28, 2015

CLINICAL DATA: Shortness of breath

EXAM:
PORTABLE CHEST 1 VIEW

[AP]
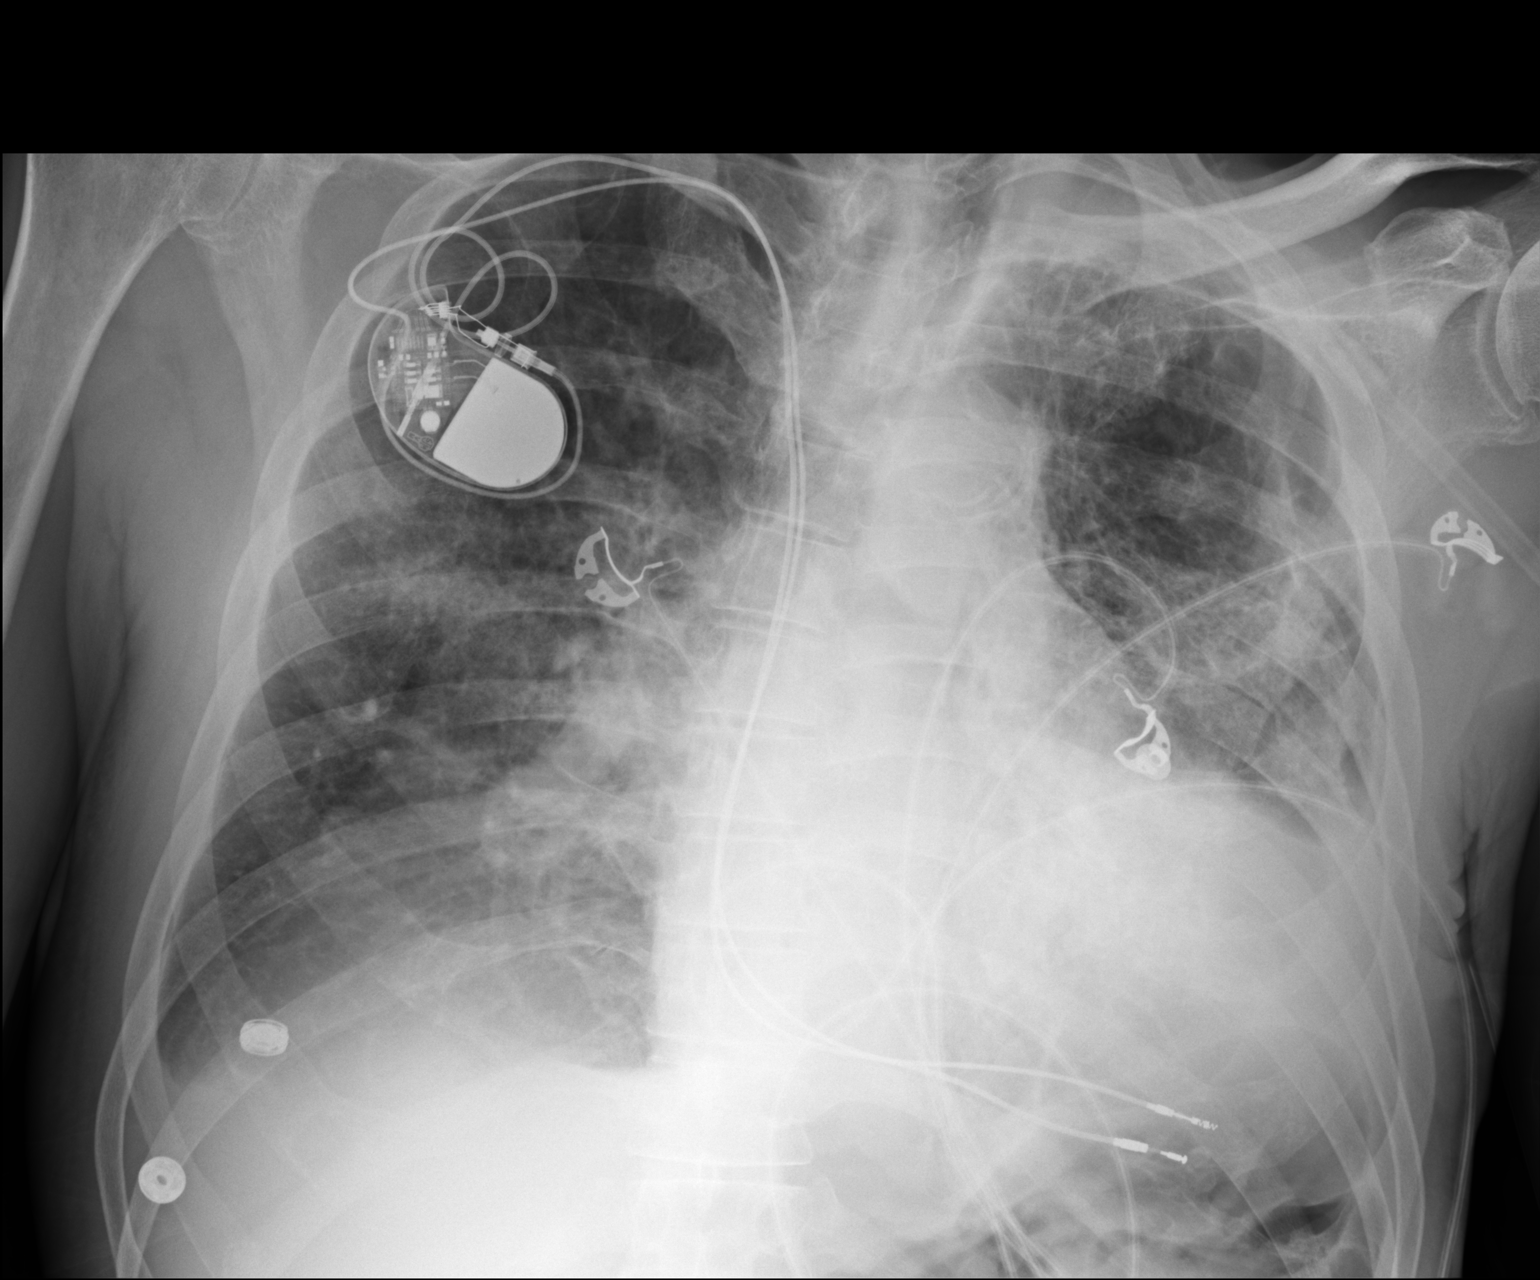

[1 of 1 positions shown; findings below may reference images not displayed]

FINDINGS: Layering effusion on the right is identified, unchanged. Underlying
opacity is seen. New mild opacity in the right mid lung. Chronic
changes again seen in the left lung. Mild increasing density seen in
the left mid lung as well. Pleural capping again seen on the left.
The cardiomediastinal silhouette is stable.
IMPRESSION: Increasing mid lung opacities bilaterally. Stable layering effusion
on the right. Recommend follow-up to resolution.

## 2016-10-21 DIAGNOSIS — F319 Bipolar disorder, unspecified: Secondary | ICD-10-CM | POA: Diagnosis not present

## 2016-10-21 DIAGNOSIS — G8929 Other chronic pain: Secondary | ICD-10-CM | POA: Diagnosis not present

## 2016-10-21 DIAGNOSIS — I5042 Chronic combined systolic (congestive) and diastolic (congestive) heart failure: Secondary | ICD-10-CM | POA: Diagnosis not present

## 2016-10-21 DIAGNOSIS — I495 Sick sinus syndrome: Secondary | ICD-10-CM | POA: Diagnosis not present

## 2016-10-21 DIAGNOSIS — I130 Hypertensive heart and chronic kidney disease with heart failure and stage 1 through stage 4 chronic kidney disease, or unspecified chronic kidney disease: Secondary | ICD-10-CM | POA: Diagnosis not present

## 2016-10-21 DIAGNOSIS — I951 Orthostatic hypotension: Secondary | ICD-10-CM | POA: Diagnosis not present

## 2016-10-21 DIAGNOSIS — N189 Chronic kidney disease, unspecified: Secondary | ICD-10-CM | POA: Diagnosis not present

## 2016-10-21 DIAGNOSIS — J449 Chronic obstructive pulmonary disease, unspecified: Secondary | ICD-10-CM | POA: Diagnosis not present

## 2016-10-21 DIAGNOSIS — R627 Adult failure to thrive: Secondary | ICD-10-CM | POA: Diagnosis not present

## 2016-10-21 NOTE — Progress Notes (Signed)
Internal Medicine Clinic Attending  Case discussed with Dr. Patel,Vishal at the time of the visit.  We reviewed the resident's history and exam and pertinent patient test results.  I agree with the assessment, diagnosis, and plan of care documented in the resident's note.  

## 2016-10-22 ENCOUNTER — Other Ambulatory Visit: Payer: Self-pay | Admitting: Internal Medicine

## 2016-10-22 ENCOUNTER — Telehealth: Payer: Self-pay

## 2016-10-22 NOTE — Telephone Encounter (Signed)
Called pt - c/o nausea; stated it started Sunday. Only intake has been Ensure and water. Stated he has taken his medications. No vomiting. Thanks

## 2016-10-22 NOTE — Telephone Encounter (Signed)
Needs to speak with a nurse about nausea. Please call pt back.

## 2016-10-23 ENCOUNTER — Telehealth: Payer: Self-pay

## 2016-10-23 ENCOUNTER — Ambulatory Visit (INDEPENDENT_AMBULATORY_CARE_PROVIDER_SITE_OTHER): Payer: Medicare HMO | Admitting: Internal Medicine

## 2016-10-23 ENCOUNTER — Other Ambulatory Visit: Payer: Self-pay | Admitting: *Deleted

## 2016-10-23 VITALS — BP 106/91 | HR 60 | Ht 75.0 in | Wt 126.0 lb

## 2016-10-23 DIAGNOSIS — R110 Nausea: Secondary | ICD-10-CM | POA: Insufficient documentation

## 2016-10-23 DIAGNOSIS — I5042 Chronic combined systolic (congestive) and diastolic (congestive) heart failure: Secondary | ICD-10-CM | POA: Diagnosis not present

## 2016-10-23 LAB — DIGOXIN LEVEL: Digoxin Level: 0.5 ng/mL — ABNORMAL LOW (ref 0.8–2.0)

## 2016-10-23 MED ORDER — ONDANSETRON HCL 4 MG PO TABS: 4.0000 mg | ORAL_TABLET | Freq: Three times a day (TID) | ORAL | 0 refills | Status: DC | PRN

## 2016-10-23 NOTE — Patient Outreach (Signed)
[facility]) Care Management  10/23/2016  [name] January 01, 1946 [id number]   Weekly transition of care call placed to member.  He report that he is doing "ok" today, has been experiencing nausea, no vomiting, since the weekend.  He state he called his primary MD office yesterday to report symptoms, has not received a call back yet.  Report being able to drink ensure and eat some foods, "but not much."  He is advised to contact office again today if symptoms return.  He report blood pressure today of 100/70, weight 135 pounds, 121 HR.  He denies knowing a target weight, will discuss during office visit next week.  He is aware that [facility] has approved for him to have mobile meals through the month of June and 2 more cases of Ensure, will reassess status next month.  Will have routine home visit next week, will deliver Ensure at that time.  [name], [place], MSN [place] [phone]

## 2016-10-23 NOTE — Telephone Encounter (Signed)
I am routing to his PCP, [name].

## 2016-10-23 NOTE — Patient Instructions (Signed)
It was a pleasure to see you today [name].  I am worried about your lack of food intake. Getting enough nutrition is essential to your immune system and energy level.  In recommend trying to take in more liquid calories such as ensure and milkshakes. For solid foods try to start with more bland items.  I have also prescribe a nausea medicine you can try taking while working on getting back to a regular diet.  I would like to see you again in the clinic next week to make sure you are improving in your hydration and eating.

## 2016-10-23 NOTE — Telephone Encounter (Signed)
Patient called back reports he is still nauseous advised patient that MD wants him to eat soft liquid diet he reports that he has been doing that and he still feels sick to his stomach. Advised patient to come in for visit appointment scheduled for today at 3:15

## 2016-10-23 NOTE — Progress Notes (Signed)
CC: Nausea, anorexia  HPI:  [name] is a 71 y.o. man here today for a 3-4 day history of decreased appetite plus nausea with dry retching episodes.   See problem based assessment and plan below for additional details  Past Medical History:  Diagnosis Date  . Alcohol abuse   . Anxiety   . Avascular necrosis of bones of both hips (HCC)    Status post bilateral hip replacements  . Bipolar disorder ([place])   . CHF (congestive heart failure) ([place])   . Chronic systolic heart failure ([place])    a. NICM - EF 35% with normal cors 2003. b. Last echo 11/2012 - EF 25-30%.  . CKD (chronic kidney disease)    Renal U/S 12/04/2009 showed no pathological findings. Labs 12/04/2009 include normal ESR, C3, C4; neg ANA; SPEP showed nonspecific increase in the alpha-2 region with no M-spike; UPEP showed no monoclonal free light chains; urine IFE showed polyclonal increase in feree Kappa and/or free Lambda light chains. Baseline Cr reported 1.7-2.5.  [name] COPD (chronic obstructive pulmonary disease) ([place])   . Depression   . DVT (deep venous thrombosis) ([place])    He has hypercoagulability with multiple prior DVTs  . E coli bacteremia   . Erectile dysfunction   . Failure to thrive (0-17) 08/2016   DEHYDRATION  . Gastritis   . GERD (gastroesophageal reflux disease)   . HCAP (healthcare-associated pneumonia) 08/24/2015  . Hydrocele, unspecified   . Hyperlipemia   . Hypertension   . 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  Thyroid scan 08/28/2009 was normal with no focal areas of abnormal increased or decreased activity seen; the uptake of I 131 sodium iodide at 24 hours was 5.7%.  TSH and free T4 normalized by 08/16/2009.  . Intestinal obstruction ([place])   . Lung nodule    Chest CT scan on 12/14/2008 showed a nodular opacity at the left lung base felt to most likely represent scarring.  Follow-up chest CT scan on 06/02/2010 showed parenchymal scarring in the left  apex, left  lower lobe, and lingula; some of this scarring at the left base had a nodular appearance, unchanged. Chest 09/2012 - stable.  . Noncompliance   . On home oxygen therapy    "3L periodically" (08/24/2015)  . Osteoarthritis   . Pleural plaque    H/o asbestos exposure. Chest CT on 06/02/2010 showed stable extensive calcified pleural plaques involving the left hemithorax, consistent with asbestos related pleural disease.  . Portal vein thrombosis   . Protein calorie malnutrition ([place]) 04/2016  . Spondylosis   . STEMI (ST elevation myocardial infarction) ([place]) 10/11/2014  . 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 b. St. Jude gen change 2010.  [name] Thrombocytopenia ([place])   . 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  . Weight loss    Normal colonoscopy by Dr. Olevia Perches on 11/06/2010.    Review of Systems:  Review of Systems  Constitutional: Positive for malaise/fatigue and weight loss.  Respiratory: Negative for shortness of breath.   Cardiovascular: Negative for chest pain.  Gastrointestinal: Positive for nausea and vomiting. Negative for abdominal pain, constipation and diarrhea.  Musculoskeletal: Negative for falls.  Neurological: Positive for dizziness and weakness.    Physical Exam: Physical Exam  Constitutional:  Cachectic, chronically ill appearing elderly man  HENT:  Mouth/Throat: Oropharynx is clear and  moist. No oropharyngeal exudate.  Cardiovascular: Normal rate and regular rhythm.   Pulmonary/Chest: Effort normal and breath sounds normal.  Abdominal: Soft. Bowel sounds are normal. There is no tenderness.  Skin: No rash noted.    Vitals:   10/23/16 1607  BP: (!) 106/91  Pulse: 60  SpO2: 97%  Weight: 126 lb (57.2 kg)  Height: _0  (1.905 m)    Assessment & Plan:   See Encounters Tab for problem based  charting.  Patient discussed with Dr. Lynnae January

## 2016-10-23 NOTE — Telephone Encounter (Signed)
Pt's here for an appt.

## 2016-10-23 NOTE — Telephone Encounter (Signed)
Please ask patient to try soft foods and clear liquids as tolerated (soups/broths, etc) and continue Ensure. If symptoms persist or worsen, please ask him to be seen in clinic for further evaluation.

## 2016-10-23 NOTE — Assessment & Plan Note (Addendum)
HPI: He reports nausea and anorexia for the past 3-4 days with episodes of dry retching and no appetite. He was recommended to try taking a liquids diet but has not done this. He was drinking ensure then tried to eat a pork chop Saturday leading to nausea. Since then he has been drinking some water and occasional ensure but mostly no food intake.  A: [name] has a chronic failure to thrive that is slowly progressing, currently on marinol for this. He does not have gastroparesis or functional GI disease based on extensive inpatient and outpatient workup prior to this visit. This may be a normal variation in his chronic disease. He was also recently restarted on digoxin for his heart failure and hard to control tachy-brady syndrome and his symptoms today are concerning for possible toxicity.  P: We will check STAT digoxin level to exclude this dangerous possible cause of his GI symptoms - level was 0.5 slightly subtherapeutic Continue marinol and encouragement of PO intake I will encourage him to advance his diet slowly with liquid and soft food, and prescribe a course of PRN zofran for nausea in the meantime

## 2016-10-24 IMAGING — CR DG ABD PORTABLE 1V
2 series · 2 of 2 positions shown · non-contrast
Comparison: 10/13/2014 abdominal CT

CLINICAL DATA: Mid abdominal pain

EXAM:
PORTABLE ABDOMEN - 1 VIEW

[AP (1 of 2)]
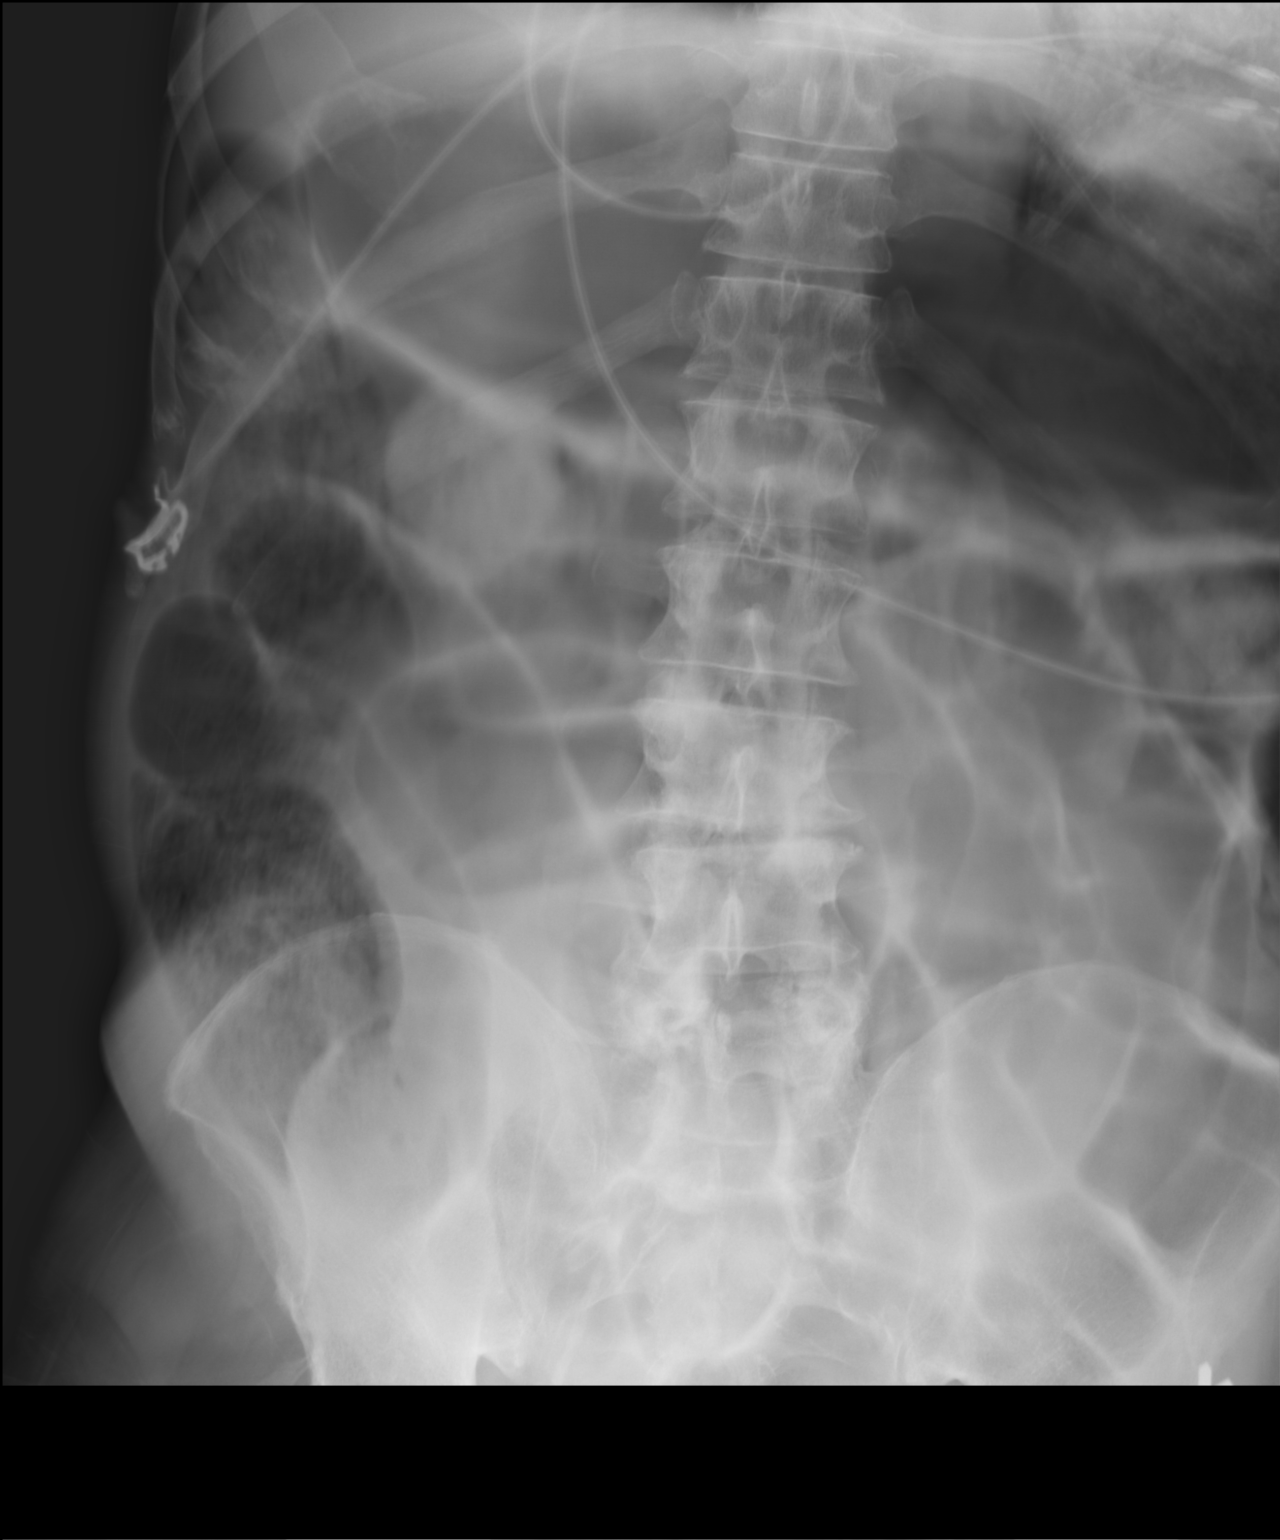

[AP (2 of 2)]
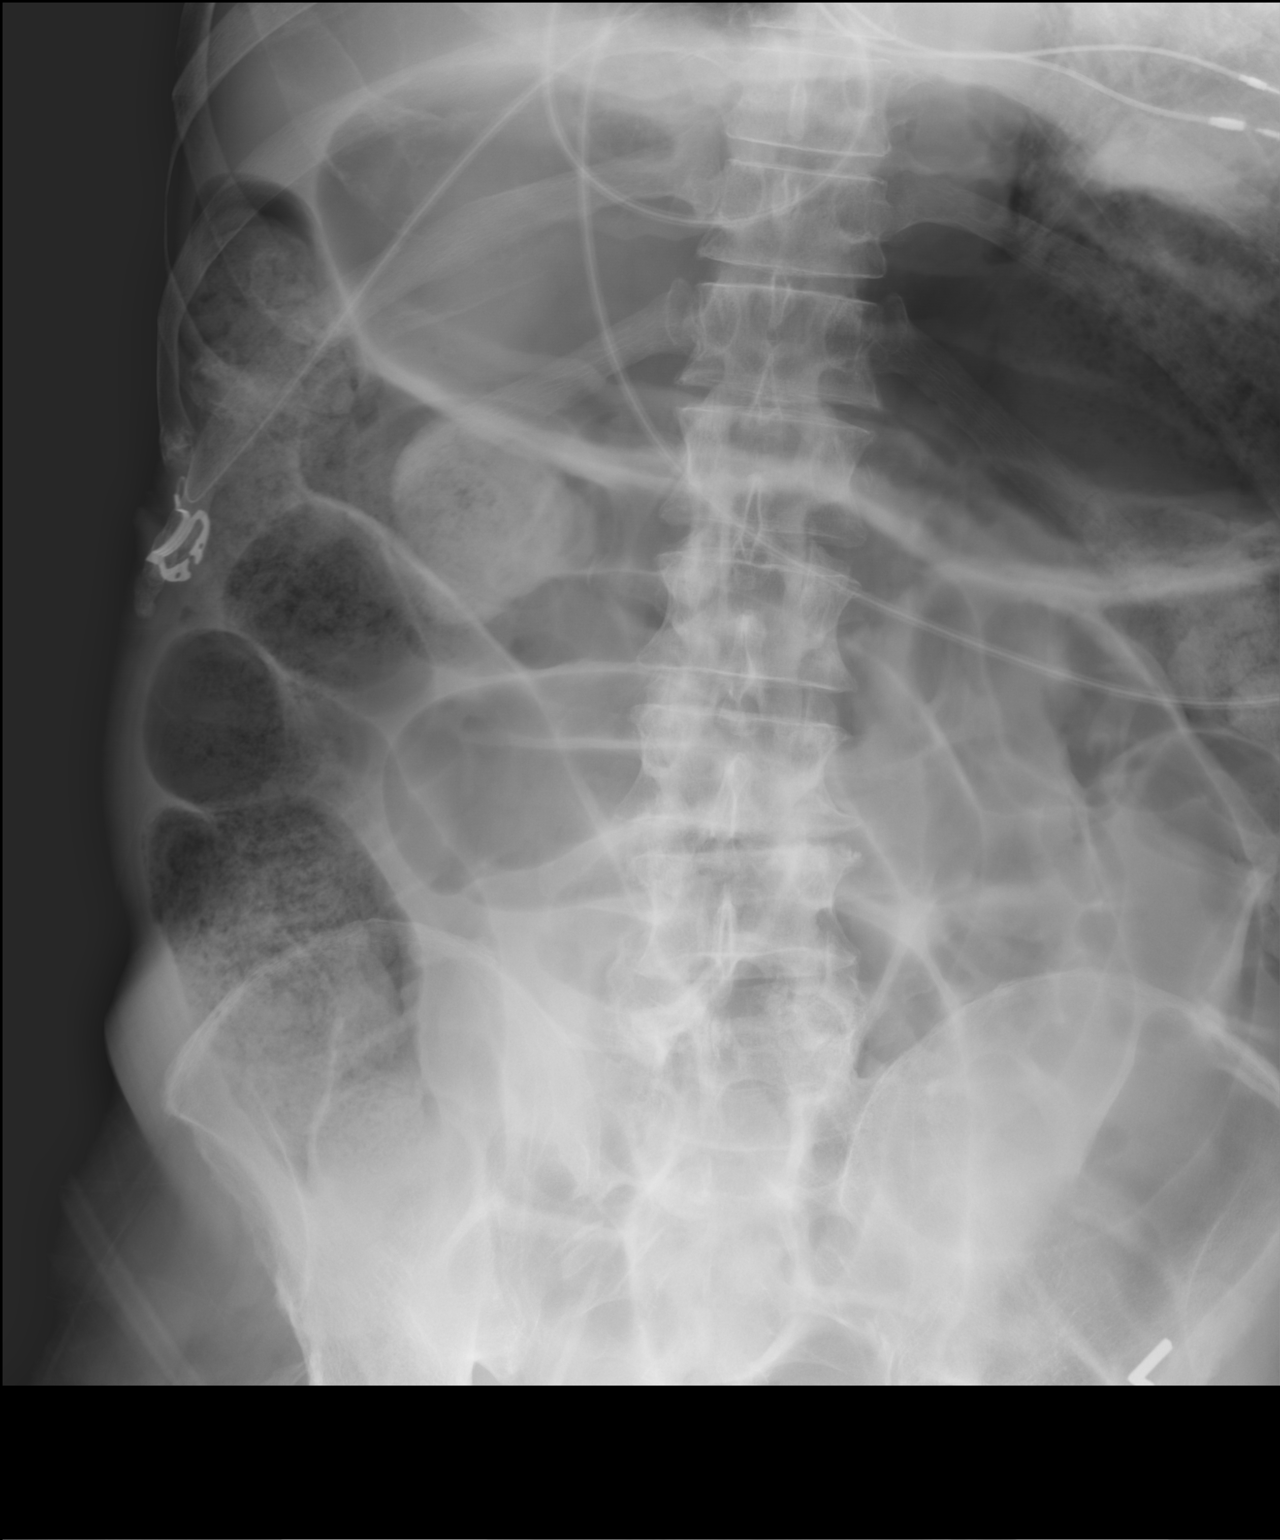

[2 of 2 positions shown; findings below may reference images not displayed]

FINDINGS: Diffuse predominately gas dilated bowel from the stomach to the
rectum. No gross pneumoperitoneum but limited by supine study with
multiple closely positioned bowel loops. No concerning
intra-abdominal mass effect or calcification. Degenerative disc
disease and bilateral hip arthroplasty.
IMPRESSION: Diffuse gas dilated bowel favoring adynamic ileus over low colonic
obstruction. The stomach is prominently distended.

## 2016-10-25 NOTE — Assessment & Plan Note (Signed)
HPI: He was restarted on digoxin during recent hospitalization for difficult to control heart rate which led to frequent hypotension when treated with higher dose beta blockers or nodal CCB. He is not having symptoms of dyspnea, palpitations, or leg swelling. Heart rate appears well controlled today. We checked digoxin level in clinic today for concern of toxicity contributing to his nausea and vomiting but level was slightly subtherapeutic.  A: Apparently well controlled heart failure  P: Metoprolol succinate 50mg  daily Digoxin 0.0625mg  daily Midodrine 5mg  TID

## 2016-10-26 IMAGING — CR DG ABD PORTABLE 1V
1 series · 1 of 1 positions shown · non-contrast
Comparison: 08/02/2015

CLINICAL DATA: Ileus.  NG tube placement.

EXAM:
PORTABLE ABDOMEN - 1 VIEW

[AP]
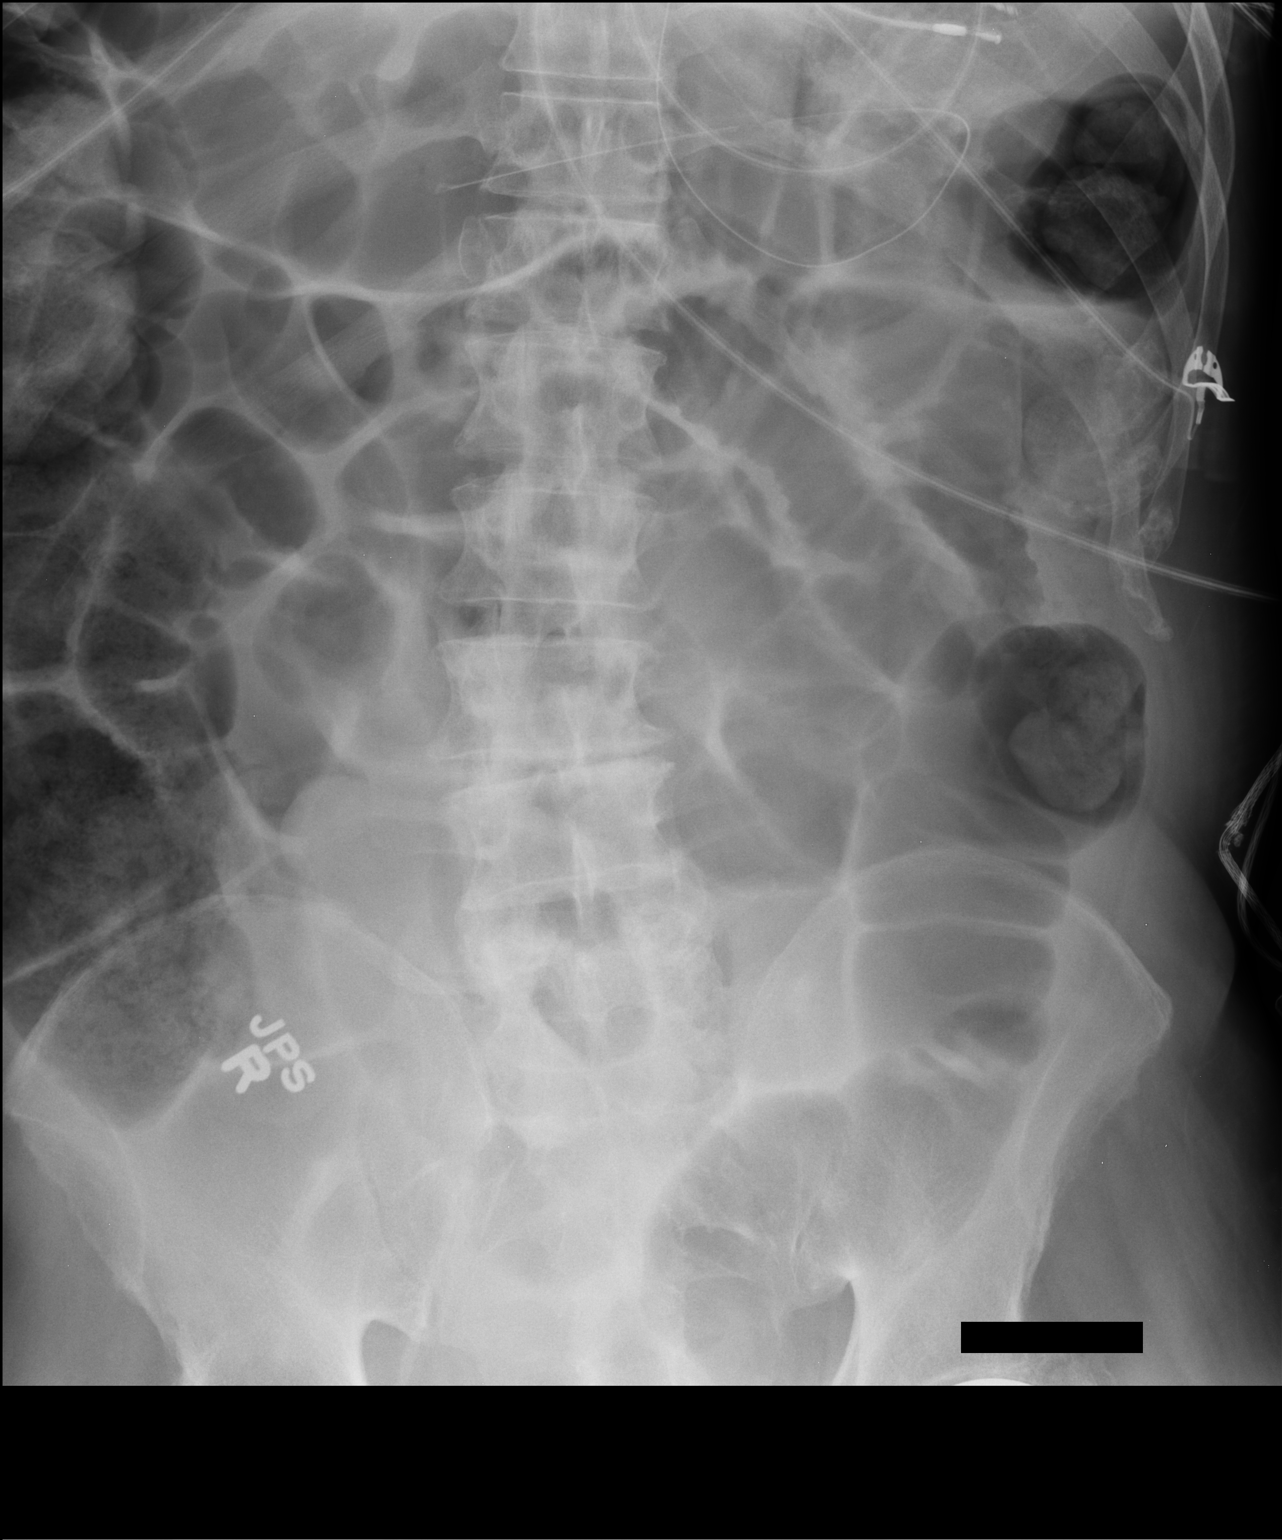

[1 of 1 positions shown; findings below may reference images not displayed]

FINDINGS: An enteric tube has been placed with tip projecting just right of
midline in the upper abdomen, likely in the distal stomach. The
stomach has been decompressed. Gas-filled, mildly dilated loops of
small and large bowel remain throughout the abdomen, minimally
improved from prior. Limited assessment for free air on this supine
study. Bilateral hip arthroplasties are partially visualized.
IMPRESSION: 1. Enteric tube terminates over the distal stomach.
2. Diffuse gaseous distention of small and large bowel compatible
with ileus and minimally improved from prior.

## 2016-10-26 IMAGING — DX DG CHEST 1V PORT
1 series · 1 of 1 positions shown · non-contrast
Comparison: 07/29/2015

CLINICAL DATA: Persistent shortness of breath.

EXAM:
PORTABLE CHEST 1 VIEW

[chest ap]
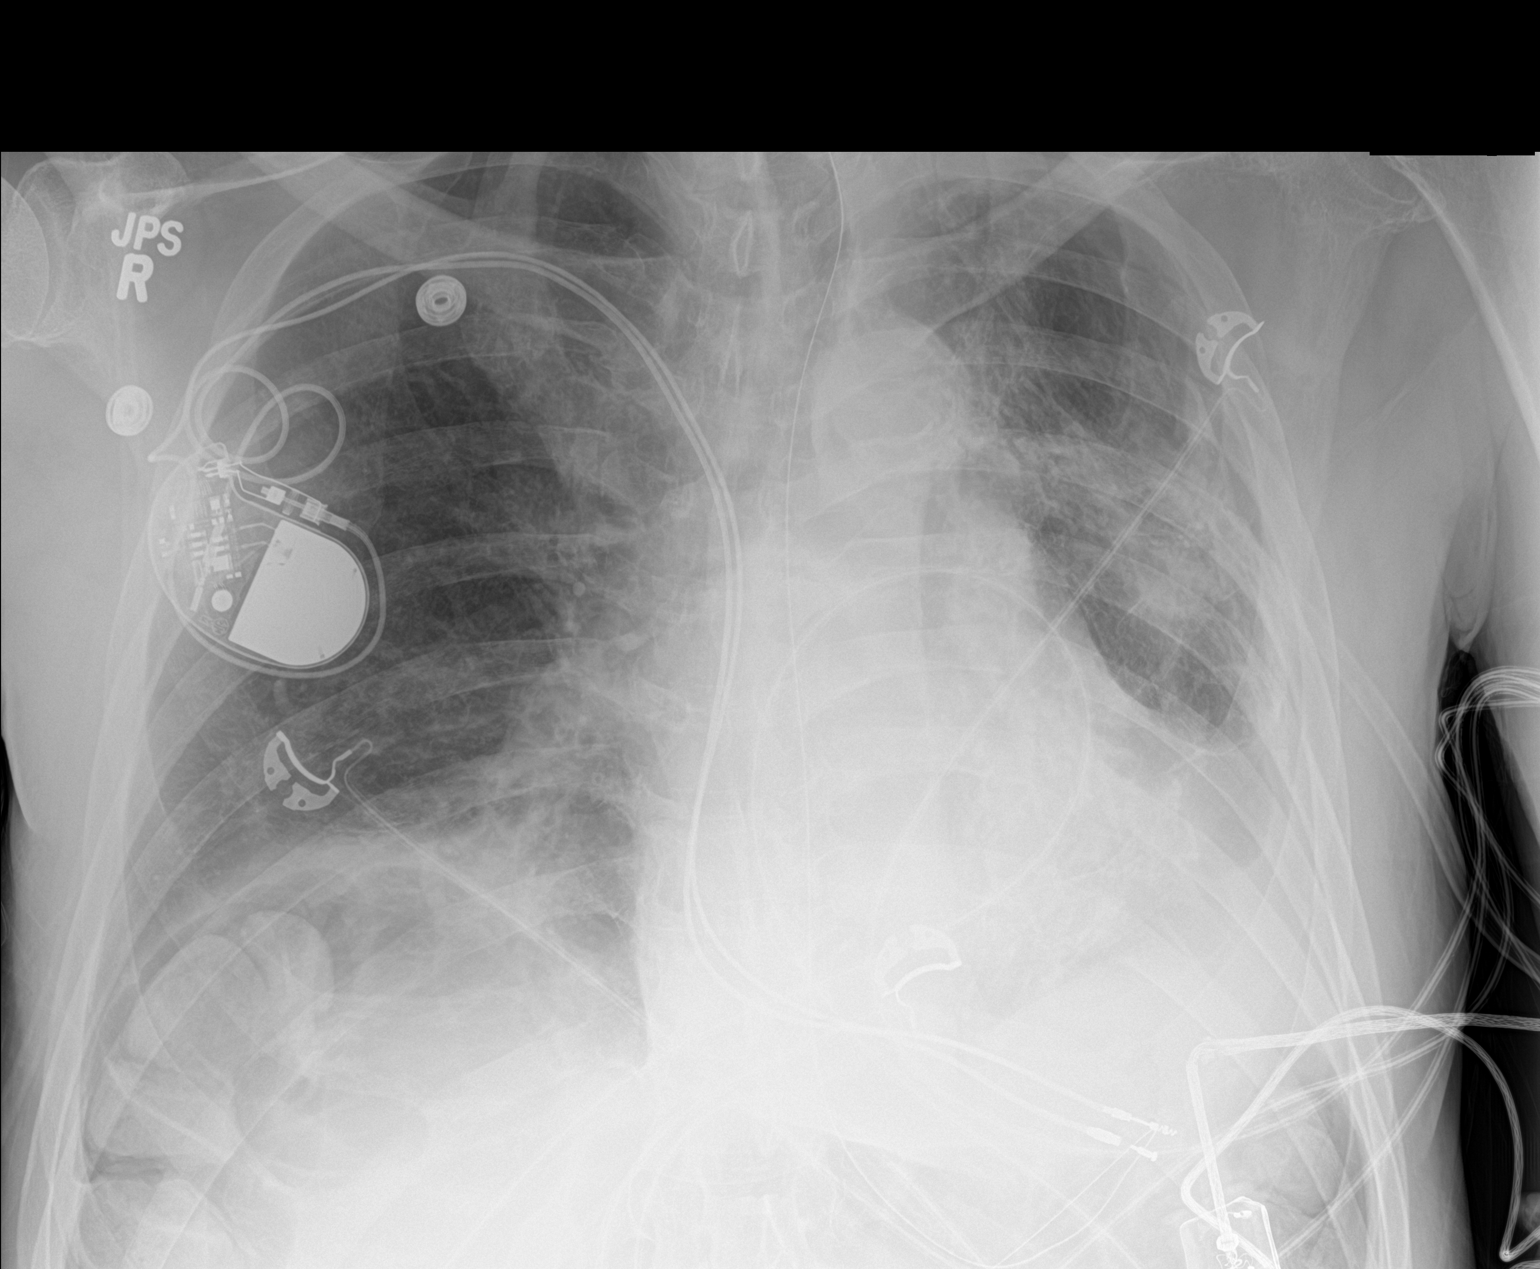

[1 of 1 positions shown; findings below may reference images not displayed]

FINDINGS: The heart is mildly enlarged but stable. There is tortuosity and
calcification of the thoracic aorta. Stable extensive scarring
changes involving the left lung and chronic pleural thickening. The
right lung remains clear. There is an NG tube coursing down the
esophagus and into the stomach. The pacer wires are stable.
IMPRESSION: Chronic left lung scarring changes and chronic pleural thickening.
No definite acute overlying pulmonary process.

NG tube in good position.

## 2016-10-28 NOTE — Progress Notes (Signed)
Internal Medicine Clinic Attending  Case discussed with Dr. Rice at the time of the visit.  We reviewed the resident's history and exam and pertinent patient test results.  I agree with the assessment, diagnosis, and plan of care documented in the resident's note.  

## 2016-10-29 ENCOUNTER — Ambulatory Visit: Payer: Self-pay | Admitting: *Deleted

## 2016-10-29 ENCOUNTER — Telehealth: Payer: Self-pay

## 2016-10-29 ENCOUNTER — Other Ambulatory Visit: Payer: Self-pay | Admitting: *Deleted

## 2016-10-29 DIAGNOSIS — I951 Orthostatic hypotension: Secondary | ICD-10-CM | POA: Diagnosis not present

## 2016-10-29 DIAGNOSIS — R627 Adult failure to thrive: Secondary | ICD-10-CM | POA: Diagnosis not present

## 2016-10-29 DIAGNOSIS — N189 Chronic kidney disease, unspecified: Secondary | ICD-10-CM | POA: Diagnosis not present

## 2016-10-29 DIAGNOSIS — F319 Bipolar disorder, unspecified: Secondary | ICD-10-CM | POA: Diagnosis not present

## 2016-10-29 DIAGNOSIS — I495 Sick sinus syndrome: Secondary | ICD-10-CM | POA: Diagnosis not present

## 2016-10-29 DIAGNOSIS — J449 Chronic obstructive pulmonary disease, unspecified: Secondary | ICD-10-CM | POA: Diagnosis not present

## 2016-10-29 DIAGNOSIS — G8929 Other chronic pain: Secondary | ICD-10-CM | POA: Diagnosis not present

## 2016-10-29 DIAGNOSIS — I5042 Chronic combined systolic (congestive) and diastolic (congestive) heart failure: Secondary | ICD-10-CM | POA: Diagnosis not present

## 2016-10-29 DIAGNOSIS — I130 Hypertensive heart and chronic kidney disease with heart failure and stage 1 through stage 4 chronic kidney disease, or unspecified chronic kidney disease: Secondary | ICD-10-CM | POA: Diagnosis not present

## 2016-10-29 NOTE — Patient Outreach (Signed)
[facility]) Care Management  10/29/2016  [name] Oct 20, 1945 [id number]   Voice message received from member concerning mobile meals.  He report that he has not received any meals since Wednesday of last week.  Call placed to member, he is made aware that the request has been made to continue mobile meals for another 30 days, which will restart no later than Thursday this week.  He is also made aware that ensure has been reordered.  He was originally scheduled for home visit this afternoon, rescheduled to Thursday to allow delivery of ensure during visit.  He agrees, will follow up later this week with home visit.  [name], [place], MSN [place] [phone]

## 2016-10-29 NOTE — Telephone Encounter (Signed)
[name] with [facility] needs to speak with a nurse about pt. Please call back.

## 2016-10-30 ENCOUNTER — Encounter: Payer: Medicare HMO | Admitting: Internal Medicine

## 2016-10-30 ENCOUNTER — Encounter (HOSPITAL_COMMUNITY): Payer: Self-pay | Admitting: Emergency Medicine

## 2016-10-30 ENCOUNTER — Emergency Department (HOSPITAL_COMMUNITY): Payer: Medicare HMO

## 2016-10-30 ENCOUNTER — Encounter: Payer: Self-pay | Admitting: Internal Medicine

## 2016-10-30 ENCOUNTER — Inpatient Hospital Stay (HOSPITAL_COMMUNITY)
Admission: EM | Admit: 2016-10-30 | Discharge: 2016-11-05 | DRG: 391 | Disposition: A | Discharge status: Home Health | Payer: Medicare HMO | Attending: Oncology | Admitting: Oncology

## 2016-10-30 DIAGNOSIS — I481 Persistent atrial fibrillation: Secondary | ICD-10-CM | POA: Diagnosis not present

## 2016-10-30 DIAGNOSIS — K5903 Drug induced constipation: Principal | ICD-10-CM | POA: Diagnosis present

## 2016-10-30 DIAGNOSIS — E7800 Pure hypercholesterolemia, unspecified: Secondary | ICD-10-CM | POA: Diagnosis present

## 2016-10-30 DIAGNOSIS — I429 Cardiomyopathy, unspecified: Secondary | ICD-10-CM | POA: Diagnosis present

## 2016-10-30 DIAGNOSIS — I4891 Unspecified atrial fibrillation: Secondary | ICD-10-CM | POA: Diagnosis present

## 2016-10-30 DIAGNOSIS — I5042 Chronic combined systolic (congestive) and diastolic (congestive) heart failure: Secondary | ICD-10-CM | POA: Diagnosis not present

## 2016-10-30 DIAGNOSIS — Z823 Family history of stroke: Secondary | ICD-10-CM

## 2016-10-30 DIAGNOSIS — R627 Adult failure to thrive: Secondary | ICD-10-CM | POA: Diagnosis present

## 2016-10-30 DIAGNOSIS — I480 Paroxysmal atrial fibrillation: Secondary | ICD-10-CM | POA: Diagnosis not present

## 2016-10-30 DIAGNOSIS — Z87891 Personal history of nicotine dependence: Secondary | ICD-10-CM

## 2016-10-30 DIAGNOSIS — I4811 Longstanding persistent atrial fibrillation: Secondary | ICD-10-CM | POA: Diagnosis present

## 2016-10-30 DIAGNOSIS — N179 Acute kidney failure, unspecified: Secondary | ICD-10-CM | POA: Diagnosis present

## 2016-10-30 DIAGNOSIS — Z96643 Presence of artificial hip joint, bilateral: Secondary | ICD-10-CM | POA: Diagnosis present

## 2016-10-30 DIAGNOSIS — E861 Hypovolemia: Secondary | ICD-10-CM | POA: Diagnosis present

## 2016-10-30 DIAGNOSIS — I252 Old myocardial infarction: Secondary | ICD-10-CM | POA: Diagnosis not present

## 2016-10-30 DIAGNOSIS — R338 Other retention of urine: Secondary | ICD-10-CM | POA: Diagnosis present

## 2016-10-30 DIAGNOSIS — N400 Enlarged prostate without lower urinary tract symptoms: Secondary | ICD-10-CM | POA: Diagnosis not present

## 2016-10-30 DIAGNOSIS — Z79891 Long term (current) use of opiate analgesic: Secondary | ICD-10-CM | POA: Diagnosis not present

## 2016-10-30 DIAGNOSIS — Z7901 Long term (current) use of anticoagulants: Secondary | ICD-10-CM

## 2016-10-30 DIAGNOSIS — R404 Transient alteration of awareness: Secondary | ICD-10-CM | POA: Diagnosis not present

## 2016-10-30 DIAGNOSIS — E43 Unspecified severe protein-calorie malnutrition: Secondary | ICD-10-CM | POA: Diagnosis present

## 2016-10-30 DIAGNOSIS — I951 Orthostatic hypotension: Secondary | ICD-10-CM | POA: Diagnosis not present

## 2016-10-30 DIAGNOSIS — N183 Chronic kidney disease, stage 3 unspecified: Secondary | ICD-10-CM | POA: Diagnosis present

## 2016-10-30 DIAGNOSIS — N189 Chronic kidney disease, unspecified: Secondary | ICD-10-CM

## 2016-10-30 DIAGNOSIS — J441 Chronic obstructive pulmonary disease with (acute) exacerbation: Secondary | ICD-10-CM | POA: Diagnosis present

## 2016-10-30 DIAGNOSIS — E785 Hyperlipidemia, unspecified: Secondary | ICD-10-CM | POA: Diagnosis present

## 2016-10-30 DIAGNOSIS — K219 Gastro-esophageal reflux disease without esophagitis: Secondary | ICD-10-CM | POA: Diagnosis present

## 2016-10-30 DIAGNOSIS — Z79899 Other long term (current) drug therapy: Secondary | ICD-10-CM

## 2016-10-30 DIAGNOSIS — I10 Essential (primary) hypertension: Secondary | ICD-10-CM | POA: Diagnosis present

## 2016-10-30 DIAGNOSIS — I482 Chronic atrial fibrillation: Secondary | ICD-10-CM | POA: Diagnosis not present

## 2016-10-30 DIAGNOSIS — R531 Weakness: Secondary | ICD-10-CM

## 2016-10-30 DIAGNOSIS — Z681 Body mass index (BMI) 19 or less, adult: Secondary | ICD-10-CM | POA: Diagnosis not present

## 2016-10-30 DIAGNOSIS — R300 Dysuria: Secondary | ICD-10-CM | POA: Diagnosis present

## 2016-10-30 DIAGNOSIS — Z950 Presence of cardiac pacemaker: Secondary | ICD-10-CM

## 2016-10-30 DIAGNOSIS — R000 Tachycardia, unspecified: Secondary | ICD-10-CM | POA: Diagnosis not present

## 2016-10-30 DIAGNOSIS — K5289 Other specified noninfective gastroenteritis and colitis: Secondary | ICD-10-CM | POA: Diagnosis present

## 2016-10-30 DIAGNOSIS — Y92009 Unspecified place in unspecified non-institutional (private) residence as the place of occurrence of the external cause: Secondary | ICD-10-CM

## 2016-10-30 DIAGNOSIS — J984 Other disorders of lung: Secondary | ICD-10-CM | POA: Diagnosis not present

## 2016-10-30 DIAGNOSIS — Z825 Family history of asthma and other chronic lower respiratory diseases: Secondary | ICD-10-CM

## 2016-10-30 DIAGNOSIS — I130 Hypertensive heart and chronic kidney disease with heart failure and stage 1 through stage 4 chronic kidney disease, or unspecified chronic kidney disease: Secondary | ICD-10-CM | POA: Diagnosis present

## 2016-10-30 DIAGNOSIS — F329 Major depressive disorder, single episode, unspecified: Secondary | ICD-10-CM | POA: Diagnosis not present

## 2016-10-30 DIAGNOSIS — G8929 Other chronic pain: Secondary | ICD-10-CM | POA: Diagnosis not present

## 2016-10-30 DIAGNOSIS — E860 Dehydration: Secondary | ICD-10-CM | POA: Diagnosis present

## 2016-10-30 DIAGNOSIS — Z8249 Family history of ischemic heart disease and other diseases of the circulatory system: Secondary | ICD-10-CM

## 2016-10-30 DIAGNOSIS — K5641 Fecal impaction: Secondary | ICD-10-CM | POA: Diagnosis not present

## 2016-10-30 DIAGNOSIS — J449 Chronic obstructive pulmonary disease, unspecified: Secondary | ICD-10-CM | POA: Diagnosis present

## 2016-10-30 DIAGNOSIS — T402X5A Adverse effect of other opioids, initial encounter: Secondary | ICD-10-CM | POA: Diagnosis not present

## 2016-10-30 DIAGNOSIS — I495 Sick sinus syndrome: Secondary | ICD-10-CM | POA: Diagnosis not present

## 2016-10-30 DIAGNOSIS — F339 Major depressive disorder, recurrent, unspecified: Secondary | ICD-10-CM | POA: Diagnosis not present

## 2016-10-30 DIAGNOSIS — Z86718 Personal history of other venous thrombosis and embolism: Secondary | ICD-10-CM

## 2016-10-30 DIAGNOSIS — I120 Hypertensive chronic kidney disease with stage 5 chronic kidney disease or end stage renal disease: Secondary | ICD-10-CM | POA: Diagnosis not present

## 2016-10-30 DIAGNOSIS — I428 Other cardiomyopathies: Secondary | ICD-10-CM

## 2016-10-30 DIAGNOSIS — Z7982 Long term (current) use of aspirin: Secondary | ICD-10-CM

## 2016-10-30 DIAGNOSIS — R64 Cachexia: Secondary | ICD-10-CM | POA: Diagnosis present

## 2016-10-30 DIAGNOSIS — I739 Peripheral vascular disease, unspecified: Secondary | ICD-10-CM | POA: Diagnosis present

## 2016-10-30 DIAGNOSIS — R109 Unspecified abdominal pain: Secondary | ICD-10-CM | POA: Diagnosis not present

## 2016-10-30 LAB — URINALYSIS, ROUTINE W REFLEX MICROSCOPIC
Bilirubin Urine: NEGATIVE
GLUCOSE, UA: NEGATIVE mg/dL
HGB URINE DIPSTICK: NEGATIVE
KETONES UR: 5 mg/dL — AB
LEUKOCYTES UA: NEGATIVE
Nitrite: NEGATIVE
PROTEIN: NEGATIVE mg/dL
Specific Gravity, Urine: 1.025 (ref 1.005–1.030)
pH: 5 (ref 5.0–8.0)

## 2016-10-30 LAB — CBC WITH DIFFERENTIAL/PLATELET
BASOS ABS: 0 10*3/uL (ref 0.0–0.1)
Basophils Relative: 0 %
EOS ABS: 0 10*3/uL (ref 0.0–0.7)
EOS PCT: 0 %
HCT: 49.7 % (ref 39.0–52.0)
Hemoglobin: 17.1 g/dL — ABNORMAL HIGH (ref 13.0–17.0)
Lymphocytes Relative: 8 %
Lymphs Abs: 1.4 10*3/uL (ref 0.7–4.0)
MCH: 32.4 pg (ref 26.0–34.0)
MCHC: 34.4 g/dL (ref 30.0–36.0)
MCV: 94.3 fL (ref 78.0–100.0)
MONO ABS: 1.3 10*3/uL — AB (ref 0.1–1.0)
Monocytes Relative: 7 %
Neutro Abs: 15.5 10*3/uL — ABNORMAL HIGH (ref 1.7–7.7)
Neutrophils Relative %: 85 %
PLATELETS: 150 10*3/uL (ref 150–400)
RBC: 5.27 MIL/uL (ref 4.22–5.81)
RDW: 14.4 % (ref 11.5–15.5)
WBC: 18.3 10*3/uL — AB (ref 4.0–10.5)

## 2016-10-30 LAB — HEPATIC FUNCTION PANEL
ALBUMIN: 4.2 g/dL (ref 3.5–5.0)
ALK PHOS: 62 U/L (ref 38–126)
ALT: 19 U/L (ref 17–63)
AST: 28 U/L (ref 15–41)
BILIRUBIN DIRECT: 0.3 mg/dL (ref 0.1–0.5)
Indirect Bilirubin: 1 mg/dL — ABNORMAL HIGH (ref 0.3–0.9)
Total Bilirubin: 1.3 mg/dL — ABNORMAL HIGH (ref 0.3–1.2)
Total Protein: 7 g/dL (ref 6.5–8.1)

## 2016-10-30 LAB — BASIC METABOLIC PANEL
ANION GAP: 11 (ref 5–15)
BUN: 41 mg/dL — AB (ref 6–20)
CALCIUM: 9.8 mg/dL (ref 8.9–10.3)
CO2: 20 mmol/L — ABNORMAL LOW (ref 22–32)
Chloride: 109 mmol/L (ref 101–111)
Creatinine, Ser: 2 mg/dL — ABNORMAL HIGH (ref 0.61–1.24)
GFR calc Af Amer: 37 mL/min — ABNORMAL LOW (ref >60)
GFR, EST NON AFRICAN AMERICAN: 32 mL/min — AB (ref >60)
GLUCOSE: 107 mg/dL — AB (ref 65–99)
Potassium: 5.5 mmol/L — ABNORMAL HIGH (ref 3.5–5.1)
SODIUM: 140 mmol/L (ref 135–145)

## 2016-10-30 LAB — LIPASE, BLOOD: Lipase: 25 U/L (ref 11–51)

## 2016-10-30 LAB — BRAIN NATRIURETIC PEPTIDE: B Natriuretic Peptide: 586.7 pg/mL — ABNORMAL HIGH (ref 0.0–100.0)

## 2016-10-30 LAB — I-STAT CG4 LACTIC ACID, ED: LACTIC ACID, VENOUS: 2.76 mmol/L — AB (ref 0.5–1.9)

## 2016-10-30 LAB — MAGNESIUM: Magnesium: 2.1 mg/dL (ref 1.7–2.4)

## 2016-10-30 LAB — DIGOXIN LEVEL: DIGOXIN LVL: 0.3 ng/mL — AB (ref 0.8–2.0)

## 2016-10-30 MED ORDER — PANTOPRAZOLE SODIUM 40 MG PO TBEC
40.0000 mg | DELAYED_RELEASE_TABLET | Freq: Every day | ORAL | Status: DC
  Administered 2016-10-31 – 2016-11-05 (×6): 40 mg via ORAL
  Filled 2016-10-30 (×7): qty 1

## 2016-10-30 MED ORDER — METOPROLOL SUCCINATE ER 100 MG PO TB24
100.0000 mg | ORAL_TABLET | Freq: Every morning | ORAL | Status: DC
  Administered 2016-10-31 – 2016-11-05 (×6): 100 mg via ORAL
  Filled 2016-10-30 (×6): qty 1

## 2016-10-30 MED ORDER — ACETAMINOPHEN 325 MG PO TABS
650.0000 mg | ORAL_TABLET | Freq: Four times a day (QID) | ORAL | Status: DC | PRN
  Administered 2016-10-31 (×2): 650 mg via ORAL
  Filled 2016-10-30 (×2): qty 2

## 2016-10-30 MED ORDER — TRAMADOL HCL 50 MG PO TABS
50.0000 mg | ORAL_TABLET | Freq: Four times a day (QID) | ORAL | Status: DC | PRN
  Administered 2016-10-31 – 2016-11-04 (×8): 50 mg via ORAL
  Filled 2016-10-30 (×9): qty 1

## 2016-10-30 MED ORDER — NICOTINE 7 MG/24HR TD PT24
7.0000 mg | MEDICATED_PATCH | Freq: Every day | TRANSDERMAL | Status: DC
Start: 2016-10-30 — End: 2016-11-05
  Administered 2016-10-31 – 2016-11-04 (×4): 7 mg via TRANSDERMAL
  Filled 2016-10-30 (×5): qty 1

## 2016-10-30 MED ORDER — ONDANSETRON HCL 4 MG/2ML IJ SOLN: 4.0000 mg | Freq: Four times a day (QID) | INTRAMUSCULAR | Status: DC | PRN

## 2016-10-30 MED ORDER — ROSUVASTATIN CALCIUM 20 MG PO TABS
20.0000 mg | ORAL_TABLET | Freq: Every day | ORAL | Status: DC
  Administered 2016-11-01 – 2016-11-04 (×4): 20 mg via ORAL
  Filled 2016-10-30 (×4): qty 1

## 2016-10-30 MED ORDER — MIRTAZAPINE 15 MG PO TABS
15.0000 mg | ORAL_TABLET | Freq: Every day | ORAL | Status: DC
  Administered 2016-10-30 – 2016-11-04 (×6): 15 mg via ORAL
  Filled 2016-10-30 (×7): qty 1

## 2016-10-30 MED ORDER — TRAMADOL HCL 50 MG PO TABS
50.0000 mg | ORAL_TABLET | Freq: Once | ORAL | Status: AC
  Administered 2016-10-30: 50 mg via ORAL
  Filled 2016-10-30: qty 1

## 2016-10-30 MED ORDER — APIXABAN 2.5 MG PO TABS
2.5000 mg | ORAL_TABLET | Freq: Two times a day (BID) | ORAL | Status: DC
  Administered 2016-10-30 – 2016-11-05 (×12): 2.5 mg via ORAL
  Filled 2016-10-30 (×11): qty 1

## 2016-10-30 MED ORDER — LACTATED RINGERS IV BOLUS (SEPSIS)
500.0000 mL | Freq: Once | INTRAVENOUS | Status: AC
  Administered 2016-10-30: 500 mL via INTRAVENOUS

## 2016-10-30 MED ORDER — SODIUM CHLORIDE 0.9% FLUSH
3.0000 mL | Freq: Two times a day (BID) | INTRAVENOUS | Status: DC
  Administered 2016-10-31 – 2016-11-04 (×6): 3 mL via INTRAVENOUS

## 2016-10-30 MED ORDER — METOPROLOL SUCCINATE ER 50 MG PO TB24
50.0000 mg | ORAL_TABLET | Freq: Every evening | ORAL | Status: DC
  Administered 2016-10-31 – 2016-11-04 (×4): 50 mg via ORAL
  Filled 2016-10-30 (×6): qty 1

## 2016-10-30 MED ORDER — ALBUTEROL SULFATE (2.5 MG/3ML) 0.083% IN NEBU: 3.0000 mL | INHALATION_SOLUTION | Freq: Four times a day (QID) | RESPIRATORY_TRACT | Status: DC | PRN

## 2016-10-30 MED ORDER — BUPROPION HCL ER (SR) 100 MG PO TB12
100.0000 mg | ORAL_TABLET | Freq: Two times a day (BID) | ORAL | Status: DC
  Administered 2016-10-30 – 2016-11-05 (×13): 100 mg via ORAL
  Filled 2016-10-30 (×14): qty 1

## 2016-10-30 MED ORDER — IOPAMIDOL (ISOVUE-370) INJECTION 76%
INTRAVENOUS | Status: AC
  Administered 2016-10-30: 100 mL
  Filled 2016-10-30: qty 100

## 2016-10-30 MED ORDER — TIOTROPIUM BROMIDE MONOHYDRATE 18 MCG IN CAPS
18.0000 ug | ORAL_CAPSULE | Freq: Every day | RESPIRATORY_TRACT | Status: DC
  Administered 2016-10-31 – 2016-11-05 (×5): 18 ug via RESPIRATORY_TRACT
  Filled 2016-10-30: qty 5

## 2016-10-30 MED ORDER — METOPROLOL TARTRATE 5 MG/5ML IV SOLN
5.0000 mg | INTRAVENOUS | Status: AC | PRN
  Administered 2016-10-30 (×3): 5 mg via INTRAVENOUS
  Filled 2016-10-30 (×3): qty 5

## 2016-10-30 MED ORDER — DIGOXIN 0.0625 MG HALF TABLET
0.0625 mg | ORAL_TABLET | Freq: Every day | ORAL | Status: DC
  Administered 2016-10-31 – 2016-11-05 (×6): 0.0625 mg via ORAL
  Filled 2016-10-30 (×6): qty 1

## 2016-10-30 MED ORDER — SODIUM CHLORIDE 0.9 % IV BOLUS (SEPSIS)
1000.0000 mL | Freq: Once | INTRAVENOUS | Status: AC
  Administered 2016-10-30: 1000 mL via INTRAVENOUS

## 2016-10-30 MED ORDER — METOPROLOL SUCCINATE ER 50 MG PO TB24
50.0000 mg | ORAL_TABLET | Freq: Once | ORAL | Status: AC
  Administered 2016-10-30: 50 mg via ORAL
  Filled 2016-10-30 (×2): qty 1

## 2016-10-30 MED ORDER — DRONABINOL 2.5 MG PO CAPS
10.0000 mg | ORAL_CAPSULE | Freq: Two times a day (BID) | ORAL | Status: DC
  Administered 2016-10-30 – 2016-11-05 (×13): 10 mg via ORAL
  Filled 2016-10-30 (×13): qty 4

## 2016-10-30 MED ORDER — MIDODRINE HCL 5 MG PO TABS
5.0000 mg | ORAL_TABLET | Freq: Three times a day (TID) | ORAL | Status: DC
  Administered 2016-10-31 – 2016-11-05 (×18): 5 mg via ORAL
  Filled 2016-10-30 (×18): qty 1

## 2016-10-30 MED ORDER — SENNOSIDES-DOCUSATE SODIUM 8.6-50 MG PO TABS
2.0000 | ORAL_TABLET | Freq: Every day | ORAL | Status: DC
  Administered 2016-10-30: 2 via ORAL
  Filled 2016-10-30: qty 2

## 2016-10-30 MED ORDER — SODIUM CHLORIDE 0.9 % IV SOLN
INTRAVENOUS | Status: DC
  Administered 2016-10-30: 1000 mL via INTRAVENOUS

## 2016-10-30 MED ORDER — ASPIRIN 81 MG PO CHEW
81.0000 mg | CHEWABLE_TABLET | Freq: Every day | ORAL | Status: DC
  Administered 2016-10-30 – 2016-11-05 (×7): 81 mg via ORAL
  Filled 2016-10-30 (×7): qty 1

## 2016-10-30 NOTE — ED Notes (Signed)
Admitting at bedside 

## 2016-10-30 NOTE — ED Triage Notes (Addendum)
Per [facility]: Pt to ED from home (lives by self) c/o generalized weakness x 3-4 days. Per EMS, pt stated that he "over did it at his granddaughter's graduation party." Pt found by daughter in bed, he reported he hadn't eaten/drank much or gotten out of bed the past few days. Pt c/o nausea and generalized abd pain upon arrival, loose stools starting today. Pt denies fevers/chills/CP. Endorses some SOB, hx COPD. EMS VS: HR 140 A-Fib, 131/100, 98% RA, CBG 163. Pt A&O x 4.

## 2016-10-30 NOTE — H&P (Signed)
Date: 10/30/2016               Patient Name:  [name] MRN: [id number]  DOB: [birth year] Age / Sex: 71 y.o., male   PCP: Zada Finders, MD         Medical Service: Internal Medicine Teaching Service         Attending Physician: Dr. Lalla Brothers    First Contact: Dr. Ophelia Shoulder Pager: [phone]  Second Contact: [name] Pager: [phone]       After Hours (After 5p/  First Contact Pager: [phone]  weekends / holidays): Second Contact Pager: [phone]   Chief Complaint: Fatigue, abdominal pain  History of Present Illness: [name] is a 71 y.o. 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 He reports that he has felt unwell for the past 2 weeks, with poor energy, no appetite, dizziness, and progressive abdominal pain, nausea, and dry heaving. He report severe constipation and his last BM was about 2 weeks ago. His abdominal is worst in suprapubic to epigastric areas, is moderate and constant, with superimposed waves of severe pain every few minutes. Eating food makes his pain and nausea worse, and consequently he has not eaten any solid food in the last three days. He continues to drink small amounts of water and Ensure. He felt so tired, depressed, and annoyed with his pill burden last weekend that he stopped taking all his medications for a few days, although he resumed them on Monday. He endorses subjective chills, dysuria, and urinary obstruction. He denies chest pain, cough, fevers, diarrhea, or SI.   In the ED he was afebrile, HR 143 in Afib-RVR, RR 19, BP 125/114, SpO2 100% on room air and severe abdominal pain. Labs remarkable for BMP with K 5.5, Cr 2.0 (baseline 1.5), and CBC with WBC 18 and Hb 17. BNP 586, lactic acid 2.76, digoxin 0.3. EKG showed Afib RVR, CXR with no acute changes. CTA of the abdomen  revealed no mesenteric ischemia, but significant stool burden / fecal impaction. He received 1.5 L IV fluids, IV metoprolol, and underwent partial disimpaction. IMTS was contacted for admission.    Meds:  Current Meds  Medication Sig  . albuterol (VENTOLIN HFA) 108 (90 BASE) MCG/ACT inhaler Inhale 2 puffs into the lungs every 6 (six) hours as needed for wheezing or shortness of breath.  [name] apixaban (ELIQUIS) 2.5 MG TABS tablet Take 1 tablet (2.5 mg total) by mouth 2 (two) times daily.  [name] aspirin 81 MG chewable tablet Chew 1 tablet (81 mg total) by mouth daily.  [name] buPROPion (WELLBUTRIN SR) 100 MG 12 hr tablet Take 1 tablet (100 mg total) by mouth 2 (two) times daily.  . digoxin 62.5 MCG TABS Take 0.0625 mg by mouth daily.  [name] dronabinol (MARINOL) 10 MG capsule Take 1 capsule (10 mg total) by mouth 2 (two) times daily before lunch and supper.  . famotidine (PEPCID) 20 MG tablet Take 1 tablet (20 mg total) by mouth daily.  . feeding supplement (BOOST / RESOURCE BREEZE) LIQD Take 1 Container by mouth 3 (three) times daily between meals.  . Meclizine HCl 25 MG CHEW Chew 25 mg by mouth daily.  . metoprolol succinate (TOPROL-XL) 50 MG 24 hr tablet Take 100 mg in the morning and 50 mg in the evening. Take with or immediately  following a meal.  . midodrine (PROAMATINE) 5 MG tablet Take 1 tablet (5 mg total) by mouth 3 (three) times daily with meals.  . mirtazapine (REMERON) 15 MG tablet Take 15 mg by mouth at bedtime.  [name] omeprazole (PRILOSEC) 40 MG capsule Take 40 mg by mouth daily.  . ondansetron (ZOFRAN) 4 MG tablet Take 1 tablet (4 mg total) by mouth every 8 (eight) hours as needed for nausea or vomiting.  [name] oxyCODONE-acetaminophen (PERCOCET) 10-325 MG tablet Take 1 tablet by mouth every 4 (four) hours as needed for pain. Do not take more than 5 tablets in 24 hours.  . polyethylene glycol (MIRALAX / GLYCOLAX) packet Take 17 g by mouth daily.  . rosuvastatin (CRESTOR) 20 MG tablet TAKE 1 TABLET EVERY DAY  AT 6 PM  . tiotropium (SPIRIVA) 18 MCG inhalation capsule Place 1 capsule (18 mcg total) into inhaler and inhale daily.    Allergies: Allergies as of 10/30/2016 - Review Complete 10/30/2016  Allergen Reaction Noted  . Amiodarone Other (See Comments) 07/30/2015  . Penicillins Rash    Past Medical History:  Diagnosis Date  . Alcohol abuse   . Anxiety   . Avascular necrosis of bones of both hips (HCC)    Status post bilateral hip replacements  . Bipolar disorder ([place])   . CHF (congestive heart failure) ([place])   . Chronic systolic heart failure ([place])    a. NICM - EF 35% with normal cors 2003. b. Last echo 11/2012 - EF 25-30%.  . CKD (chronic kidney disease)    Renal U/S 12/04/2009 showed no pathological findings. Labs 12/04/2009 include normal ESR, C3, C4; neg ANA; SPEP showed nonspecific increase in the alpha-2 region with no M-spike; UPEP showed no monoclonal free light chains; urine IFE showed polyclonal increase in feree Kappa and/or free Lambda light chains. Baseline Cr reported 1.7-2.5.  [name] COPD (chronic obstructive pulmonary disease) ([place])   . Depression   . DVT (deep venous thrombosis) ([place])    He has hypercoagulability with multiple prior DVTs  . E coli bacteremia   . Erectile dysfunction   . Failure to thrive (0-17) 08/2016   DEHYDRATION  . Gastritis   . GERD (gastroesophageal reflux disease)   . HCAP (healthcare-associated pneumonia) 08/24/2015  . Hydrocele, unspecified   . Hyperlipemia   . Hypertension   . 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  Thyroid scan 08/28/2009 was normal with no focal areas of abnormal increased or decreased activity seen; the uptake of I 131 sodium iodide at 24 hours was 5.7%.  TSH and free T4 normalized by 08/16/2009.  . Intestinal obstruction ([place])   . Lung nodule    Chest CT scan on 12/14/2008 showed a nodular opacity at the left lung base felt to most likely represent scarring.  Follow-up chest CT scan  on 06/02/2010 showed parenchymal scarring in the left apex, left  lower lobe, and lingula; some of this scarring at the left base had a nodular appearance, unchanged. Chest 09/2012 - stable.  . Noncompliance   . On home oxygen therapy    "3L periodically" (08/24/2015)  . Osteoarthritis   . Pleural plaque    H/o asbestos exposure. Chest CT on 06/02/2010 showed stable extensive calcified pleural plaques involving the left hemithorax, consistent with asbestos related pleural disease.  . Portal vein thrombosis   . Protein calorie malnutrition ([place]) 04/2016  . Spondylosis   . STEMI (ST elevation myocardial infarction) ([place]) 10/11/2014  . Tachycardia-bradycardia  syndrome [facility])    a. 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 b. St. Jude gen change 2010.  [name] Thrombocytopenia ([place])   . 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  . Weight loss    Normal colonoscopy by Dr. Olevia Perches on 11/06/2010.    Family History:  Family History  Problem Relation Age of Onset  . Hypertension Mother   . Asthma Mother   . Coronary artery disease Mother   . Cancer Mother   . Stroke Mother   . Cancer Brother        Unsure of type.  [name] Heart disease Brother   . Heart attack Brother   . Heart attack Sister   . Colon cancer Neg Hx   . Lung cancer Neg Hx   . Prostate cancer Neg Hx     Social History:  Social History   Social History  . Marital status: Widowed    Spouse name: N/A  . Number of children: 10  . Years of education: N/A   Occupational History  . RETIRED Retired   Social History Main Topics  . Smoking status: Current Some Day Smoker    Packs/day: 0.25    Years: 54.00    Types: Cigarettes    Start date: 75  . Smokeless tobacco: Current User     Comment: Will discussed with his provider this week 2 per day  . Alcohol use No     Comment: 6/22//2016 "h/o etoh abuse; stopped drinking ~ 2012"  . Drug  use: Yes    Frequency: 1.0 time per week    Types: Marijuana     Comment: smokes marijuana every other day  . Sexual activity: Not Currently   Other Topics Concern  . Not on file   Social History Narrative   Retired Architect O27 yrs here in [place]   Smoker 1 pack last 1.5 days tobacco, smokes marijuana    Denies EtOH x 4 years (on 10/12/12)   9 kids    From [place]   [place] Veteran    12 grade education              Review of Systems: A complete ROS was negative except as per HPI.   Physical Exam: Blood pressure (!) 135/107, pulse (!) 132, temperature 98.5 F (36.9 C), temperature source Oral, resp. rate 18, SpO2 100 %.  General appearance: Cachectic elderly man resting in bed under heavy blankets, appears tired and uncomfortable, soft spoken HENT: Normocephalic, atraumatic, dry mucous membranes Eyes: PERRL, non-icteric Cardiovascular: Tachycardic irregular rate/rhythm, no murmurs, rubs, gallops Respiratory: Diminished breath sounds bilaterally, normal work of breathing Abdomen: Scaphoid, diminished bowel sounds, soft, palpable suprapubic mass, tender to light palpation over epigastrum and lower quadrants Extremities: Thin bulk, no edema, 1+ peripheral pulses, poor hygiene, cool distal extremities Skin: Warm, dry, decreased skin turgor Neuro: Alert and oriented Psych: Depressed affect, clear speech, thoughts linear and goal-directed  Assessment & Plan by Problem: Principal Problem:   Atrial fibrillation with RVR ([place]) Active Problems:   History of tobacco abuse   Essential hypertension   Cardiac pacemaker in situ   COPD (chronic obstructive pulmonary disease) (HCC)   Chronic anticoagulation   Major depressive disorder, recurrent episode (HCC)   Nonischemic cardiomyopathy (HCC)   Chronic combined systolic and diastolic congestive heart failure (HCC)   PVD (peripheral vascular disease) (HCC)   BPH (benign prostatic hyperplasia)   Longstanding  persistent atrial  fibrillation (HCC)   Chronic pain   CKD (chronic kidney disease) stage 3, GFR 30-59 ml/min   History of DVT (deep vein thrombosis)   Fecal impaction (HCC)  Fecal impaction with stercoral colitis, causing progressive abdominal pain and nausea. Presenting with severe constipation / fecal burden and no BM in 2 weeks. This has likely precipitated his other issues, including decreased appetite/intake which has caused dehydration and AKI, and likely impaired effectiveness of his oral medications. He takes chronic opioids with no consistent bowel regimen. Underwent partial fecal disimpaction in the ED. Rectal stool burden is palpable in his lower abdomen. He remains uncomfortable and nauseous. -- Soap suds enema -- Senna 2 tablets QHS -- Tramadol Q6h prn for pain, holding home Oxycodone-APAP -- IV Zofran PRN for nausea  Atrial fibrillation with RVR, presented with Afib RVR to 140s, adequate BP, some improvement with three doses of IV Metoprolol 5 mg. Likely due to decreased GI absorption of his medications in the setting of severe obstruction and N/V. He has had issues tolerating numerous rate control and antiarrythmics in the past due to symptomatic hypotension or bradycardia. Discharged last month on Metoprolol XL 50 BID and new Rx Digoxin. Metoprolol was increased to 100/50 two weeks ago with good rate control, digoxin level has been subtherapeutic. -- Metoprolol 24hr 50 mg tonight, then resume home regimen 100/50 am/pm -- home Digoxin 0.0625 mg daily -- home Apixaban 2.5 mg BID -- Consider cardiology consult in the AM  Dehydration, poor po intake for last two weeks, limited fluids and clinically dry on exam with decreased skin turgor and dry mucous membranes. Lab work is consistent, shows mild AKI and hemoconcentration. Caution with rapid rehydration given his history of combined CHF and EF 30%. -- S/p 1.5 L IV fluids in the ED -- maintenance NS 75 cc/hr overnight  AKI, creatinine 2.0 from  baseline 1.5, in the setting of 2 weeks of decreased PO intake and persistent N/V secondary to fecal impaction/obstruction -- IVF per above -- Trend BMP  Dysuria, reports dysuria, obstructive symptoms, has large stool burden in the rectum that is likely causing urinary retention. Has similar symptoms last admission with negative UA, attributed to dehydration/concentrated urine -- Check UA  Orthostatic hypotension -- home Midodrine 5 mg TID  Chronic pain -- hold home Oxycodone-APAP given severe constipation  Failure to thrive -- continue Boost TID, Marinol 10 mg BID  HLD -- Rosuvastatin 20 mg daily  COPD -- home prn Albuterol, Spiriva QD  Depression -- Bupropion 12hr 100 mg BID  FEN/GI: Liquid diet, replete electrolytes as needed  DVT ppx: Lovenox  Code status: DNI  Dispo: Admit patient to Observation with expected length of stay less than 2 midnights.  Signed: Asencion Partridge, MD 10/30/2016, 9:55 PM  Pager: [phone]

## 2016-10-30 NOTE — ED Notes (Signed)
Patient transported to CT, on monitor.

## 2016-10-30 NOTE — ED Notes (Signed)
MD at bedside. 

## 2016-10-31 ENCOUNTER — Other Ambulatory Visit: Payer: Self-pay | Admitting: *Deleted

## 2016-10-31 ENCOUNTER — Ambulatory Visit: Payer: Self-pay | Admitting: *Deleted

## 2016-10-31 DIAGNOSIS — N179 Acute kidney failure, unspecified: Secondary | ICD-10-CM

## 2016-10-31 DIAGNOSIS — Z7951 Long term (current) use of inhaled steroids: Secondary | ICD-10-CM

## 2016-10-31 DIAGNOSIS — G8929 Other chronic pain: Secondary | ICD-10-CM

## 2016-10-31 DIAGNOSIS — R64 Cachexia: Secondary | ICD-10-CM

## 2016-10-31 DIAGNOSIS — Z809 Family history of malignant neoplasm, unspecified: Secondary | ICD-10-CM

## 2016-10-31 DIAGNOSIS — Z823 Family history of stroke: Secondary | ICD-10-CM

## 2016-10-31 DIAGNOSIS — K5641 Fecal impaction: Secondary | ICD-10-CM

## 2016-10-31 DIAGNOSIS — J449 Chronic obstructive pulmonary disease, unspecified: Secondary | ICD-10-CM

## 2016-10-31 DIAGNOSIS — Z888 Allergy status to other drugs, medicaments and biological substances status: Secondary | ICD-10-CM

## 2016-10-31 DIAGNOSIS — R627 Adult failure to thrive: Secondary | ICD-10-CM

## 2016-10-31 DIAGNOSIS — Z8249 Family history of ischemic heart disease and other diseases of the circulatory system: Secondary | ICD-10-CM

## 2016-10-31 DIAGNOSIS — I495 Sick sinus syndrome: Secondary | ICD-10-CM

## 2016-10-31 DIAGNOSIS — T402X5A Adverse effect of other opioids, initial encounter: Secondary | ICD-10-CM

## 2016-10-31 DIAGNOSIS — Z79891 Long term (current) use of opiate analgesic: Secondary | ICD-10-CM

## 2016-10-31 DIAGNOSIS — E860 Dehydration: Secondary | ICD-10-CM

## 2016-10-31 DIAGNOSIS — I481 Persistent atrial fibrillation: Secondary | ICD-10-CM

## 2016-10-31 DIAGNOSIS — R300 Dysuria: Secondary | ICD-10-CM

## 2016-10-31 DIAGNOSIS — I951 Orthostatic hypotension: Secondary | ICD-10-CM

## 2016-10-31 DIAGNOSIS — I5042 Chronic combined systolic (congestive) and diastolic (congestive) heart failure: Secondary | ICD-10-CM

## 2016-10-31 DIAGNOSIS — Z950 Presence of cardiac pacemaker: Secondary | ICD-10-CM

## 2016-10-31 DIAGNOSIS — N183 Chronic kidney disease, stage 3 (moderate): Secondary | ICD-10-CM

## 2016-10-31 DIAGNOSIS — Z79899 Other long term (current) drug therapy: Secondary | ICD-10-CM

## 2016-10-31 DIAGNOSIS — Z880 Allergy status to penicillin: Secondary | ICD-10-CM

## 2016-10-31 DIAGNOSIS — E785 Hyperlipidemia, unspecified: Secondary | ICD-10-CM

## 2016-10-31 DIAGNOSIS — F339 Major depressive disorder, recurrent, unspecified: Secondary | ICD-10-CM

## 2016-10-31 DIAGNOSIS — Z825 Family history of asthma and other chronic lower respiratory diseases: Secondary | ICD-10-CM

## 2016-10-31 DIAGNOSIS — F17210 Nicotine dependence, cigarettes, uncomplicated: Secondary | ICD-10-CM

## 2016-10-31 DIAGNOSIS — K5289 Other specified noninfective gastroenteritis and colitis: Secondary | ICD-10-CM

## 2016-10-31 DIAGNOSIS — Z7901 Long term (current) use of anticoagulants: Secondary | ICD-10-CM

## 2016-10-31 LAB — BASIC METABOLIC PANEL
Anion gap: 8 (ref 5–15)
BUN: 39 mg/dL — ABNORMAL HIGH (ref 6–20)
CALCIUM: 9 mg/dL (ref 8.9–10.3)
CO2: 20 mmol/L — ABNORMAL LOW (ref 22–32)
CREATININE: 1.7 mg/dL — AB (ref 0.61–1.24)
Chloride: 111 mmol/L (ref 101–111)
GFR, EST AFRICAN AMERICAN: 45 mL/min — AB (ref >60)
GFR, EST NON AFRICAN AMERICAN: 38 mL/min — AB (ref >60)
Glucose, Bld: 123 mg/dL — ABNORMAL HIGH (ref 65–99)
Potassium: 4.6 mmol/L (ref 3.5–5.1)
SODIUM: 139 mmol/L (ref 135–145)

## 2016-10-31 LAB — CBC
HCT: 45.1 % (ref 39.0–52.0)
Hemoglobin: 15.2 g/dL (ref 13.0–17.0)
MCH: 31.7 pg (ref 26.0–34.0)
MCHC: 33.7 g/dL (ref 30.0–36.0)
MCV: 94.2 fL (ref 78.0–100.0)
PLATELETS: 144 10*3/uL — AB (ref 150–400)
RBC: 4.79 MIL/uL (ref 4.22–5.81)
RDW: 14.4 % (ref 11.5–15.5)
WBC: 13.6 10*3/uL — AB (ref 4.0–10.5)

## 2016-10-31 LAB — LACTIC ACID, PLASMA: LACTIC ACID, VENOUS: 1 mmol/L (ref 0.5–1.9)

## 2016-10-31 MED ORDER — SENNOSIDES-DOCUSATE SODIUM 8.6-50 MG PO TABS
2.0000 | ORAL_TABLET | Freq: Two times a day (BID) | ORAL | Status: DC
  Administered 2016-10-31 – 2016-11-05 (×11): 2 via ORAL
  Filled 2016-10-31 (×11): qty 2

## 2016-10-31 MED ORDER — ADULT MULTIVITAMIN W/MINERALS CH
1.0000 | ORAL_TABLET | Freq: Every day | ORAL | Status: DC
  Administered 2016-10-31 – 2016-11-05 (×6): 1 via ORAL
  Filled 2016-10-31 (×6): qty 1

## 2016-10-31 MED ORDER — ENSURE ENLIVE PO LIQD
237.0000 mL | Freq: Three times a day (TID) | ORAL | Status: DC
  Administered 2016-10-31 – 2016-11-04 (×6): 237 mL via ORAL

## 2016-10-31 NOTE — Care Management Note (Addendum)
Case Management Note  Patient Details  Name: [name] MRN: [id number] Date of Birth: 11-Nov-1945  Subjective/Objective:       CM following for progression and d/c planning.              Action/Plan: 10/31/2016 Pt active with [facility] for [facility] services, pt also followed by [facility].  Both agencies aware of pt admission.   Expected Discharge Date:  11/02/16               Expected Discharge Plan:  [place]  In-House Referral:  NA  Discharge planning Services  CM Consult  Post Acute Care Choice:  Home Health Choice offered to:  Patient  DME Arranged:    DME Agency:     HH Arranged:  RN Farmer Agency:  [place]  Status of Service:  In process, will continue to follow  If discussed at Long Length of Stay Meetings, dates discussed:    Additional Comments:  Adron Bene, RN 10/31/2016, 11:54 AM

## 2016-10-31 NOTE — Progress Notes (Signed)
PT Cancellation Note  Patient Details Name: [name] MRN: [id number] DOB: 06-20-1945   Cancelled Treatment:    Reason Eval/Treat Not Completed: Patient declined, no reason specified. Pt reports that he is sick and doesn't want to be bothered. PT will check back tomorrow as time allows.    Scheryl Marten PT, DPT  [phone]  10/31/2016, 3:24 PM

## 2016-10-31 NOTE — Progress Notes (Signed)
[facility]  Patient Status: Active (receiving services up to time of hospitalization)  [facility] is providing the following services: RN  If patient discharges after hours, please call [phone].   [name] 10/31/2016, 11:28 AM

## 2016-10-31 NOTE — Progress Notes (Signed)
Initial Nutrition Assessment  DOCUMENTATION CODES:   Severe malnutrition in context of chronic illness, Underweight  INTERVENTION:   Ensure Enlive po TID, each supplement provides 350 kcal and 20 grams of protein  Magic cup TID with meals, each supplement provides 290 kcal and 9 grams of protein  Recommend REGULAR diet once advanced. Pt may benefit from smaller, more frequent meals (Ensure Enlive between meals at present)  Recommend addition of MVI daily   NUTRITION DIAGNOSIS:   Malnutrition (Severe) related to chronic illness (CHF, CKD, COPD) as evidenced by severe depletion of body fat, percent weight loss, severe depletion of muscle mass.   GOAL:   Patient will meet greater than or equal to 90% of their needs   MONITOR:   PO intake, Supplement acceptance, Labs, Diet advancement, Weight trends  REASON FOR ASSESSMENT:   Malnutrition Screening Tool    ASSESSMENT:   71 yo male admitted with poor energy, no appetite, dizziness and progressive abdominal pain x 2 weeks with fecal impaction with stercoral colitis, AKI. Pt with hx of CHF, COPD, chronic pain, CKD, depression, HTN, HL, FTT, malnutrition  Pt very familiar to clinical nutrition team from previous admissions. Pt with hx of FTT. Pt disimpacted in ED but continues with abdominal pain  Pt had not eaten solid foods for the last 3 days PTA. Pt with very poor appetite PTA with abdominal pain due to impaction, colitis. pt chronically has poor appetite and early satiety meeting clinical characteristics for severe malnutrition on multiple previous admissions.  Pt has been on appetite stimulant for many months now but weight continues to decline.   Recorded po intake 0% thus far, abdominal pain and nausea continues  Weight down from previous admission in May, 9.6% wt los sin 1 month  Nutrition-Focused physical exam completed. Findings are severe fat depletion, severe muscle depletion, and no edema.   Labs: Creatinine  1.7, potassium wdl Meds: marinol, remeron, senna-docusate  Diet Order:  Diet full liquid Room service appropriate? Yes; Fluid consistency: Thin  Skin:  Reviewed, no issues  Last BM:  10/30/16  Height:   Ht Readings from Last 1 Encounters:  10/30/16 6\' 3"  (1.905 m)    Weight:   Wt Readings from Last 1 Encounters:  10/30/16 122 lb 2.2 oz (55.4 kg)    Ideal Body Weight:  89.1 kg  BMI:  Body mass index is 15.27 kg/m.  Estimated Nutritional Needs:   Kcal:  6301-6010 kcals  Protein:  96-110 g  Fluid:  >/= 1.6 L  EDUCATION NEEDS:   No education needs identified at this time  [place], [place], LDN [phone] Pager  [phone] Weekend/On-Call Pager

## 2016-10-31 NOTE — Progress Notes (Signed)
   Subjective: No acute events overnight. Patient manually disimpacted yesterday in the ED. No additional bowel movements overnight. He continues to have abdominal pain and poor appetite.   Objective:  Vital signs in last 24 hours: Vitals:   10/30/16 2237 10/31/16 0450 10/31/16 0821 10/31/16 1024  BP: 122/82 (!) 124/99 (!) 122/91 125/86  Pulse: (!) 127 80 (!) 140 (!) 129  Resp: 20 20 16 18   Temp: 98.8 F (37.1 C) 99 F (37.2 C) 98.1 F (36.7 C) 98.3 F (36.8 C)  TempSrc: Oral Oral Oral Oral  SpO2: 100% 100% 100% 100%  Weight: 122 lb 2.2 oz (55.4 kg)     Height: 6\' 3"  (1.905 m)      Physical Exam  Constitutional: He is oriented to person, place, and time.  Thin, cachectic and frail  HENT:  Head: Normocephalic and atraumatic.  Cardiovascular:  Tachycardic, irregular  Respiratory: Effort normal and breath sounds normal.  GI: Soft. There is tenderness.  Tenderness to palpation throughout the abdomen  Musculoskeletal: He exhibits no edema.  Neurological: He is alert and oriented to person, place, and time.     Assessment/Plan:  Principal Problem:   Atrial fibrillation with RVR (HCC) Active Problems:   History of tobacco abuse   Essential hypertension   Cardiac pacemaker in situ   COPD (chronic obstructive pulmonary disease) (HCC)   Chronic anticoagulation   Major depressive disorder, recurrent episode (HCC)   Nonischemic cardiomyopathy (HCC)   Chronic combined systolic and diastolic congestive heart failure (HCC)   PVD (peripheral vascular disease) (HCC)   BPH (benign prostatic hyperplasia)   Longstanding persistent atrial fibrillation (HCC)   Chronic pain   CKD (chronic kidney disease) stage 3, GFR 30-59 ml/min   History of DVT (deep vein thrombosis)   Fecal impaction (HCC)  # 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 Patient manually disimpacted in the emergency department yesterday afternoon. No additional bowel movements. Continues to have  abdominal pain. I think his constipation secondary to chronic opioid use. We'll proceed with aggressive bowel regimen today. -- Soap suds enema -- Senna 2 tablets BID -- Tramadol Q6h prn for pain, holding home Oxycodone-APAP -- IV Zofran PRN for nausea  # Atrial fibrillation with RVR Mr. Ide has difficult to control atrial fibrillation with rapid ventricular response. Some improvements overnight with IV metoprolol. Asymptomatic on rounds morning. Transition to home regimen. -- Metoprolol succinate 100 mg every morning, 50 mg every evening -- home Digoxin 0.0625 mg daily -- home Apixaban 2.5 mg BID  # Dysuria Reports dysuria, obstructive symptoms, has large stool burden in the rectum that is likely causing urinary retention. Has similar symptoms last admission with negative UA, attributed to dehydration/concentrated urine -- UA negative. Most likely secondary to dehydration and constipation  # Orthostatic hypotension  -- home Midodrine 5 mg TID  # Chronic pain  -- hold home Oxycodone-APAP given severe constipation  # Failure to thrive  -- continue Boost TID, Marinol 10 mg BID  # HLD  -- Rosuvastatin 20 mg daily  # COPD  -- home prn Albuterol, Spiriva QD  # Depression  -- Bupropion 12hr 100 mg BID  FEN/GI: Liquid diet, replete electrolytes as needed DVT ppx: On Eliquis  Code status: DNI   Dispo: Anticipated discharge in approximately 1-2 day(s).   Ophelia Shoulder, MD 10/31/2016, 10:30 AM Pager: [phone]

## 2016-10-31 NOTE — Patient Outreach (Signed)
[facility]) Care Management  10/31/2016  [name] 09/30/1945 [id number]   Noted that member was readmitted to hospital last night with kidney failure and Atrial fibrillation with RVR.  Hospital liaison notified, will follow up once discharged.  Request made for palliative care consult while he remains inpatient.  [name], [place], MSN [place] [phone]

## 2016-10-31 NOTE — Progress Notes (Signed)
Arrival Method: Patient arrived in stretcher from ED. Mental Orientation: alert and oriented Telemetry:box 2  Assessment: See Doc Flow sheets. Skin: Warm, dry and intact. IV: Peripheral I right forearm Pain:.scale 4 Fall Prevention Safety Plan: Patient educated about fall prevention safety plan, understood and acknowledged. Admission Screening:6700 Orientation: Patient has been oriented to the unit, staff and to the room.

## 2016-10-31 NOTE — Progress Notes (Signed)
Patient declined soap suds enema, wanted to get sleep. New order for soap suds enema ordered for 0800. Patient also declined repeat lactic acid draw. Reordered for 0500 morning lab draw.  Martyn Malay, DO PGY-3 Internal Medicine Resident 10/31/2016 3:41 AM

## 2016-10-31 NOTE — Progress Notes (Signed)
PT Cancellation Note  Patient Details Name: RAMAJ FRANGOS MRN: [id number] DOB: [birth year]   Cancelled Treatment:    Reason Eval/Treat Not Completed: Other (comment) pt is with NT and having BM. Will check back later as time allows.    Scheryl Marten PT, DPT  [phone]  10/31/2016, 3:07 PM

## 2016-10-31 NOTE — ED Provider Notes (Signed)
[facility] DEPT Provider Note   CSN: [id number] Arrival date & time: 10/30/16  1720     History   Chief Complaint Chief Complaint  Patient presents with  . Weakness  . Atrial Fibrillation    HPI [name] is a 71 y.o. male.  HPI 71 yo male with a pmh significant for COPD, CHF, hypercholesterolemia, hypertension, afib/flutter who missed all of his medications over the weekend while he was out of town and presents with near syncope, and weakness. He is currently in afib with RVR. He has increased sputum production, which began over the weekend. He also endorses nausea but has not vomited and also has abdominal pain and loose BM for the past few days.   Past Medical History:  Diagnosis Date  . Alcohol abuse   . Anxiety   . Avascular necrosis of bones of both hips (HCC)    Status post bilateral hip replacements  . Bipolar disorder ([place])   . CHF (congestive heart failure) ([place])   . Chronic systolic heart failure ([place])    a. NICM - EF 35% with normal cors 2003. b. Last echo 11/2012 - EF 25-30%.  . CKD (chronic kidney disease)    Renal U/S 12/04/2009 showed no pathological findings. Labs 12/04/2009 include normal ESR, C3, C4; neg ANA; SPEP showed nonspecific increase in the alpha-2 region with no M-spike; UPEP showed no monoclonal free light chains; urine IFE showed polyclonal increase in feree Kappa and/or free Lambda light chains. Baseline Cr reported 1.7-2.5.  [name] COPD (chronic obstructive pulmonary disease) ([place])   . Depression   . DVT (deep venous thrombosis) ([place])    He has hypercoagulability with multiple prior DVTs  . E coli bacteremia   . Erectile dysfunction   . Failure to thrive (0-17) 08/2016   DEHYDRATION  . Gastritis   . GERD (gastroesophageal reflux disease)   . HCAP (healthcare-associated pneumonia) 08/24/2015  . Hydrocele, unspecified   . Hyperlipemia   . Hypertension   . 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   Thyroid scan 08/28/2009 was normal with no focal areas of abnormal increased or decreased activity seen; the uptake of I 131 sodium iodide at 24 hours was 5.7%.  TSH and free T4 normalized by 08/16/2009.  . Intestinal obstruction ([place])   . Lung nodule    Chest CT scan on 12/14/2008 showed a nodular opacity at the left lung base felt to most likely represent scarring.  Follow-up chest CT scan on 06/02/2010 showed parenchymal scarring in the left apex, left  lower lobe, and lingula; some of this scarring at the left base had a nodular appearance, unchanged. Chest 09/2012 - stable.  . Noncompliance   . On home oxygen therapy    "3L periodically" (08/24/2015)  . Osteoarthritis   . Pleural plaque    H/o asbestos exposure. Chest CT on 06/02/2010 showed stable extensive calcified pleural plaques involving the left hemithorax, consistent with asbestos related pleural disease.  . Portal vein thrombosis   . Protein calorie malnutrition ([place]) 04/2016  . Spondylosis   . STEMI (ST elevation myocardial infarction) ([place]) 10/11/2014  . 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 b. St. Jude gen change 2010.  [name] Thrombocytopenia ([place])   . 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  . Weight loss    Normal colonoscopy by Dr. Olevia Perches on 11/06/2010.  Patient Active Problem List   Diagnosis Date Noted  . Fecal impaction ([place]) 10/30/2016  . Nausea 10/23/2016  . Hospital discharge follow-up 10/17/2016  . Hypotension 10/09/2016  . History of MI (myocardial infarction)   . History of DVT (deep vein thrombosis)   . Arterial embolus and thrombosis of lower extremity ([place])   . Thrombocytopenia ([place]) 09/10/2016  . Non-compliance with treatment 09/05/2016  . Abdominal aortic atherosclerosis ([place]) 09/05/2016  . BMI less than 19,adult 09/05/2016  . Counseling regarding advanced  directives and goals of care 07/17/2016  . Failure to thrive in adult 04/29/2016  . Protein-calorie malnutrition, severe 02/18/2016  . Chronic hydrocele   . Asbestosis ([place])   . CKD (chronic kidney disease) stage 3, GFR 30-59 ml/min   . Coagulopathy ([place])   . Long term current use of opiate analgesic 06/27/2015  . Decreased appetite 06/19/2015  . Neuropathic pain of both feet 05/03/2015  . Chronic pain 12/13/2014  . Longstanding persistent atrial fibrillation ([place]) 10/11/2014  . Healthcare maintenance 01/25/2014  . BPH (benign prostatic hyperplasia) 01/20/2014  . Atypical atrial flutter ([place]) 01/17/2014  . Atrial fibrillation with RVR ([place]) 01/17/2014  . PVD (peripheral vascular disease) ([place]) 12/10/2013  . Chronic combined systolic and diastolic congestive heart failure ([place]) 06/20/2010  . Chronic anticoagulation 06/05/2010  . H/O Arterial embolism of leg  06/05/2010  . Nonischemic cardiomyopathy ([place]) 06/01/2010  . Major depressive disorder, recurrent episode ([place]) 01/19/2010  . History of hyperthyroidism 03/13/2009  . Bilateral hip pain 03/23/2007  . OSTEOARTHRITIS 09/05/2006  . COPD (chronic obstructive pulmonary disease) ([place]) 08/28/2006  . ERECTILE DYSFUNCTION 03/01/2006  . History of tobacco abuse 03/01/2006  . Essential hypertension 03/01/2006  . Cardiac pacemaker in situ 03/01/2006    Past Surgical History:  Procedure Laterality Date  . CARDIAC CATHETERIZATION  2003   Nl cors, EF 35%  . CARDIOVERSION N/A 05/27/2013   Procedure: CARDIOVERSION;  Surgeon: Candee Furbish, MD;  Location: [facility] ENDOSCOPY;  Service: Cardiovascular;  Laterality: N/A;  . EMBOLECTOMY Right 12/10/2013   Procedure: Right Femoral Embolectomy; Fasciotomy Right Lower Leg with Intraoperative arteriogram;  Surgeon: Rosetta Posner, MD;  Location: [facility];  Service: Vascular;  Laterality: Right;  . EMBOLECTOMY Left 10/12/2014   Procedure: Left Femoral Thrombectomy with intraoperative arteriogram;  Surgeon: Rosetta Posner, MD;  Location: [facility];  Service: Vascular;  Laterality: Left;  . FASCIOTOMY CLOSURE Right 12/15/2013   Procedure: LEG FASCIOTOMY CLOSURE;  Surgeon: Rosetta Posner, MD;  Location: [facility];  Service: Vascular;  Laterality: Right;  . HEMIARTHROPLASTY HIP Left 09/09/2002   bipolar; Dr. Lafayette Dragon  . INSERT / REPLACE / REMOVE PACEMAKER  02/2003   Dr. Roe Rutherford, [employer] pulse generator identity SR model [id number] serial [id number]; gen change by Greggory Brandy  . INSERT / REPLACE / REMOVE PACEMAKER  06/17/2008   Lead and generator change w/ [employer] Tendril ST model [id number] (serial  [id number]).       . INTRAOPERATIVE ARTERIOGRAM Right 12/10/2013   Procedure: INTRA OPERATIVE ARTERIOGRAM;  Surgeon: Rosetta Posner, MD;  Location: [facility];  Service: Vascular;  Laterality: Right;  . JOINT REPLACEMENT    . TEE WITHOUT CARDIOVERSION N/A 05/27/2013   Procedure: TRANSESOPHAGEAL ECHOCARDIOGRAM (TEE);  Surgeon: Candee Furbish, MD;  Location: [facility] ENDOSCOPY;  Service: Cardiovascular;  Laterality: N/A;  . TOTAL HIP ARTHROPLASTY Right 03/08/2008    By Dr. Hiram Comber       Home Medications    Prior to Admission medications  Medication Sig Start Date End Date Taking? Authorizing Provider  albuterol (VENTOLIN HFA) 108 (90 BASE) MCG/ACT inhaler Inhale 2 puffs into the lungs every 6 (six) hours as needed for wheezing or shortness of breath. 01/06/15  Yes Aldine Contes, MD  apixaban (ELIQUIS) 2.5 MG TABS tablet Take 1 tablet (2.5 mg total) by mouth 2 (two) times daily. 09/20/16  Yes Zada Finders, MD  aspirin 81 MG chewable tablet Chew 1 tablet (81 mg total) by mouth daily. 11/17/14  Yes Riccardo Dubin, MD  buPROPion (WELLBUTRIN SR) 100 MG 12 hr tablet Take 1 tablet (100 mg total) by mouth 2 (two) times daily. 09/20/16  Yes Zada Finders, MD  digoxin 62.5 MCG TABS Take 0.0625 mg by mouth daily. 10/15/16  Yes Lorella Nimrod, MD  dronabinol (MARINOL) 10 MG capsule Take 1 capsule (10 mg total) by mouth 2 (two) times daily  before lunch and supper. 09/20/16  Yes Zada Finders, MD  famotidine (PEPCID) 20 MG tablet Take 1 tablet (20 mg total) by mouth daily. 10/14/16  Yes Lorella Nimrod, MD  feeding supplement (BOOST / RESOURCE BREEZE) LIQD Take 1 Container by mouth 3 (three) times daily between meals. 09/09/16  Yes Burgess Estelle, MD  Meclizine HCl 25 MG CHEW Chew 25 mg by mouth daily as needed (for dizziness or vertigo).    Yes [provider]  metoprolol succinate (TOPROL-XL) 50 MG 24 hr tablet Take 100 mg in the morning and 50 mg in the evening. Take with or immediately following a meal. 10/16/16  Yes Zada Finders, MD  midodrine (PROAMATINE) 5 MG tablet Take 1 tablet (5 mg total) by mouth 3 (three) times daily with meals. 10/14/16  Yes Lorella Nimrod, MD  omeprazole (PRILOSEC) 40 MG capsule Take 40 mg by mouth daily.   Yes [provider]  ondansetron (ZOFRAN) 4 MG tablet Take 1 tablet (4 mg total) by mouth every 8 (eight) hours as needed for nausea or vomiting. 10/23/16  Yes Rice, Resa Miner, MD  oxyCODONE-acetaminophen (PERCOCET) 10-325 MG tablet Take 1 tablet by mouth every 4 (four) hours as needed for pain. Do not take more than 5 tablets in 24 hours. 10/16/16 10/15/17 Yes Zada Finders, MD  polyethylene glycol (MIRALAX / GLYCOLAX) packet Take 17 g by mouth daily. Patient taking differently: Take 17 g by mouth daily as needed for moderate constipation.  09/20/16  Yes Zada Finders, MD  rosuvastatin (CRESTOR) 20 MG tablet TAKE 1 TABLET EVERY DAY AT 6 PM Patient taking differently: TAKE 20 MG EVERY DAY AT 6 PM 10/22/16  Yes Zada Finders, MD  tiotropium (SPIRIVA) 18 MCG inhalation capsule Place 1 capsule (18 mcg total) into inhaler and inhale daily. 01/06/15  Yes Aldine Contes, MD    Family History Family History  Problem Relation Age of Onset  . Hypertension Mother   . Asthma Mother   . Coronary artery disease Mother   . Cancer Mother   . Stroke Mother   . Cancer Brother        Unsure of type.  [name]  Heart disease Brother   . Heart attack Brother   . Heart attack Sister   . Colon cancer Neg Hx   . Lung cancer Neg Hx   . Prostate cancer Neg Hx     Social History Social History  Substance Use Topics  . Smoking status: Current Some Day Smoker    Packs/day: 0.25    Years: 54.00    Types: Cigarettes    Start date: 34  .  Smokeless tobacco: Current User     Comment: Will discussed with his provider this week 2 per day  . Alcohol use No     Comment: 6/22//2016 "h/o etoh abuse; stopped drinking ~ 2012"     Allergies   Amiodarone and Penicillins   Review of Systems Review of Systems  Constitutional: Positive for activity change and appetite change.  Respiratory: Positive for cough and shortness of breath.   Cardiovascular: Positive for chest pain.  Gastrointestinal: Positive for abdominal pain and nausea.  Genitourinary: Negative for dysuria.  Skin: Negative for rash.  Neurological: Positive for dizziness and light-headedness.  All other systems reviewed and are negative.    Physical Exam Updated Vital Signs BP 125/86 (BP Location: Left Arm)   Pulse (!) 129   Temp 98.3 F (36.8 C) (Oral)   Resp 18   Ht _0  (1.905 m)   Wt 55.4 kg (122 lb 2.2 oz)   SpO2 100%   BMI 15.27 kg/m   Physical Exam  Constitutional: He is oriented to person, place, and time. He appears well-developed.  HENT:  Head: Atraumatic.  Eyes: EOM are normal.  Neck: Neck supple. JVD present.  Cardiovascular:  Murmur heard. Tachycardia, irregular  Pulmonary/Chest: Effort normal. He has no wheezes. He has no rales.  Abdominal: There is tenderness. There is guarding. There is no rebound.  Neurological: He is alert and oriented to person, place, and time.  Skin: Skin is warm.  Nursing note and vitals reviewed.    ED Treatments / Results  Labs (all labs ordered are listed, but only abnormal results are displayed) Labs Reviewed  CBC WITH DIFFERENTIAL/PLATELET - Abnormal; Notable for the  following:       Result Value   WBC 18.3 (*)    Hemoglobin 17.1 (*)    Neutro Abs 15.5 (*)    Monocytes Absolute 1.3 (*)    All other components within normal limits  BASIC METABOLIC PANEL - Abnormal; Notable for the following:    Potassium 5.5 (*)    CO2 20 (*)    Glucose, Bld 107 (*)    BUN 41 (*)    Creatinine, Ser 2.00 (*)    GFR calc non Af Amer 32 (*)    GFR calc Af Amer 37 (*)    All other components within normal limits  DIGOXIN LEVEL - Abnormal; Notable for the following:    Digoxin Level 0.3 (*)    All other components within normal limits  BRAIN NATRIURETIC PEPTIDE - Abnormal; Notable for the following:    B Natriuretic Peptide 586.7 (*)    All other components within normal limits  HEPATIC FUNCTION PANEL - Abnormal; Notable for the following:    Total Bilirubin 1.3 (*)    Indirect Bilirubin 1.0 (*)    All other components within normal limits  URINALYSIS, ROUTINE W REFLEX MICROSCOPIC - Abnormal; Notable for the following:    Color, Urine AMBER (*)    APPearance HAZY (*)    Ketones, ur 5 (*)    All other components within normal limits  BASIC METABOLIC PANEL - Abnormal; Notable for the following:    CO2 20 (*)    Glucose, Bld 123 (*)    BUN 39 (*)    Creatinine, Ser 1.70 (*)    GFR calc non Af Amer 38 (*)    GFR calc Af Amer 45 (*)    All other components within normal limits  CBC - Abnormal; Notable for the following:  WBC 13.6 (*)    Platelets 144 (*)    All other components within normal limits  I-STAT CG4 LACTIC ACID, ED - Abnormal; Notable for the following:    Lactic Acid, Venous 2.76 (*)    All other components within normal limits  MAGNESIUM  LIPASE, BLOOD  LACTIC ACID, PLASMA    EKG  EKG Interpretation  Date/Time:  Wednesday October 30 2016 17:33:20 EDT Ventricular Rate:  123 PR Interval:    QRS Duration: 94 QT Interval:  260 QTC Calculation: 372 R Axis:   -48 Text Interpretation:  Atrial fibrillation LVH with secondary repolarization  abnormality Inferior infarct, old ST depression in lateral leads No acute changes Confirmed by Varney Biles ([id number]) on 10/30/2016 6:07:37 PM       Radiology Dg Chest Port 1 View  Result Date: 10/30/2016 CLINICAL DATA:  71 year old male with weakness, a shortness of breath and dizziness for 3-4 days. Personal history of asbestos related lung disease. EXAM: PORTABLE CHEST 1 VIEW COMPARISON:  Chest radiographs 10/08/2016. PET-CT 11/15/2015, and earlier. FINDINGS: Portable AP semi upright view at 1815 hours. Chronic lung disease with calcified pleural plaques and architectural distortion in the left lung with volume loss. Right pulmonary hyperinflation. Stable lung volumes. Stable cardiac size and mediastinal contours. Calcified aortic atherosclerosis. Stable right chest cardiac pacemaker. No pneumothorax, pulmonary edema or acute pulmonary opacity identified. Stable visible bowel gas pattern. IMPRESSION: Stable chronic lung disease. No superimposed acute findings are identified. Electronically Signed   By: Genevie Ann M.D.   On: 10/30/2016 18:29   Ct Angio Abd/pel W And/or Wo Contrast  Result Date: 10/30/2016 CLINICAL DATA:  Abdominal pain and hypertension. History of atrial fibrillation. Evaluate for ischemia. EXAM: CTA ABDOMEN AND PELVIS WITHOUT AND WITH CONTRAST TECHNIQUE: Multidetector CT imaging of the abdomen and pelvis was performed using the standard protocol during bolus administration of intravenous contrast. Multiplanar reconstructed images and MIPs were obtained and reviewed to evaluate the vascular anatomy. CONTRAST:  100 cc Isovue 370 IV COMPARISON:  02/02/2016 CT FINDINGS: VASCULAR Aorta: Normal caliber aorta without aneurysm, dissection, vasculitis or significant stenosis. Scattered mild atherosclerosis of the abdominal aorta. Celiac: Patent without evidence of aneurysm, dissection, vasculitis or significant stenosis. SMA: Patent without evidence of aneurysm, dissection, vasculitis or  significant stenosis. No thrombosis noted. Renals: Both renal arteries are patent though the right renal artery segment within attenuated in appearance without focal stenosis. This may account for the mild renal atrophy and cortical thinning of the right kidney. IMA: Patent without evidence of aneurysm, dissection, vasculitis or significant stenosis. Inflow: Atherosclerosis of the common iliac arteries without aneurysm or significant stenosis. Proximal Outflow: Bilateral common femoral and visualized portions of the superficial and profunda femoral arteries are patent without evidence of aneurysm, dissection, vasculitis or significant stenosis. Veins: No obvious venous abnormality within the limitations of this arterial phase study. Review of the MIP images confirms the above findings. NON-VASCULAR Lower chest: Calcified pleural plaque at the left lung base posteriorly. Chronic mild elevation of the left hemidiaphragm. Hepatobiliary: Colonic interposition over the liver. No space-occupying mass of the liver. Gallbladder is not well visualized but is free opaque calculi. Pancreas: Atrophic pancreas without ductal dilatation or enhancing mass. Spleen: Normal in size without focal abnormality. Adrenals/Urinary Tract: No adrenal mass. Cortical thinning and scarring right worse than left with small stable cortical cysts too small to further characterize. Urinary bladder is obscured by streak artifacts from patient's bilateral hip arthroplasties. Stomach/Bowel: Fecal impaction with a large 10 cm in diameter  stool ball in the rectum with mild-to-moderate inflammatory circumferential rectal wall thickening consistent with stercoral colitis. No bowel obstruction. The stomach is physiologically distended. Normal appearing appendix. Lymphatic: No lymphadenopathy. Reproductive: Prostate is obscured by streak artifacts from the hip arthroplasties. Other: No abdominopelvic ascites. Musculoskeletal: Mild dextroconvex curvature of  the lumbar spine with degenerative disc disease at L3-4. Bilateral intact hip arthroplasties with postop heterotopic new bone formation about both hips. IMPRESSION: VASCULAR 1. Aortic atherosclerosis without aneurysm or dissection. 2. Patent branch vessels off the aorta with slight thinning the right renal artery relative to left accounting for slightly smaller appearing right kidney. NON-VASCULAR 1. Large stool ball in the rectum with inflammatory thickening consistent with stercoral colitis and fecal impaction. 2. No evidence of mesenteric ischemia. 3. Bilateral renal cortical scarring with small or attenuating hypodensities consistent with cysts bilaterally. 4. Left lower lobe pleural plaque. Electronically Signed   By: Ashley Royalty M.D.   On: 10/30/2016 19:37    Procedures Fecal disimpaction Date/Time: 10/30/2016 7:00 PM Performed by: Varney Biles Authorized by: Varney Biles  Consent: Verbal consent obtained. Risks and benefits: risks, benefits and alternatives were discussed Consent given by: patient Patient understanding: patient states understanding of the procedure being performed Required items: required blood products, implants, devices, and special equipment available Patient identity confirmed: arm band Time out: Immediately prior to procedure a "time out" was called to verify the correct patient, procedure, equipment, support staff and site/side marked as required. Local anesthesia used: no  Anesthesia: Local anesthesia used: no  Sedation: Patient sedated: no Patient tolerance: Patient tolerated the procedure well with no immediate complications    (including critical care time)  CRITICAL CARE Performed by: Varney Biles   Total critical care time: 38 minutes for Af with RVR with iv meds.  Critical care time was exclusive of separately billable procedures and treating other patients.  Critical care was necessary to treat or prevent imminent or life-threatening  deterioration.  Critical care was time spent personally by me on the following activities: development of treatment plan with patient and/or surrogate as well as nursing, discussions with consultants, evaluation of patient's response to treatment, examination of patient, obtaining history from patient or surrogate, ordering and performing treatments and interventions, ordering and review of laboratory studies, ordering and review of radiographic studies, pulse oximetry and re-evaluation of patient's condition.   Medications Ordered in ED Medications  acetaminophen (TYLENOL) tablet 650 mg (650 mg Oral Given 10/31/16 1047)  mirtazapine (REMERON) tablet 15 mg (15 mg Oral Given 10/30/16 2337)  pantoprazole (PROTONIX) EC tablet 40 mg (40 mg Oral Given 10/31/16 1048)  rosuvastatin (CRESTOR) tablet 20 mg (not administered)  metoprolol succinate (TOPROL-XL) 24 hr tablet 100 mg (100 mg Oral Given 10/31/16 1047)  metoprolol succinate (TOPROL-XL) 24 hr tablet 50 mg (not administered)  digoxin (LANOXIN) tablet 0.0625 mg (0.0625 mg Oral Given 10/31/16 1047)  midodrine (PROAMATINE) tablet 5 mg (5 mg Oral Given 10/31/16 0926)  apixaban (ELIQUIS) tablet 2.5 mg (2.5 mg Oral Given 10/31/16 1047)  buPROPion (WELLBUTRIN SR) 12 hr tablet 100 mg (100 mg Oral Given 10/31/16 0927)  dronabinol (MARINOL) capsule 10 mg (10 mg Oral Given 10/30/16 2335)  albuterol (PROVENTIL) (2.5 MG/3ML) 0.083% nebulizer solution 3 mL (not administered)  tiotropium (SPIRIVA) inhalation capsule 18 mcg (18 mcg Inhalation Given 10/31/16 1118)  aspirin chewable tablet 81 mg (81 mg Oral Given 10/31/16 1048)  ondansetron (ZOFRAN) injection 4 mg (not administered)  nicotine (NICODERM CQ - dosed in mg/24 hr) patch 7 mg (  not administered)  sodium chloride flush (NS) 0.9 % injection 3 mL (3 mLs Intravenous Not Given 10/31/16 1059)  traMADol (ULTRAM) tablet 50 mg (not administered)  0.9 %  sodium chloride infusion ( Intravenous Stopped 10/31/16 1126)    senna-docusate (Senokot-S) tablet 2 tablet (2 tablets Oral Given 10/31/16 1118)  sodium chloride 0.9 % bolus 1,000 mL (0 mLs Intravenous Stopped 10/30/16 2040)  iopamidol (ISOVUE-370) 76 % injection (100 mLs  Contrast Given 10/30/16 1842)  lactated ringers bolus 500 mL (0 mLs Intravenous Stopped 10/30/16 2208)  metoprolol tartrate (LOPRESSOR) injection 5 mg (5 mg Intravenous Given 10/30/16 2102)  traMADol (ULTRAM) tablet 50 mg (50 mg Oral Given 10/30/16 2338)  metoprolol succinate (TOPROL-XL) 24 hr tablet 50 mg (50 mg Oral Given 10/30/16 2338)     Initial Impression / Assessment and Plan / ED Course  I have reviewed the triage vital signs and the nursing notes.  Pertinent labs & imaging results that were available during my care of the patient were reviewed by me and considered in my medical decision making (see chart for details).    Pt comes in with cc of abd pain, dib, dizziness. He has hx of AF with RVR, poorly controlled, some of it due to non compliance. Pt went out of town this  weekend and didn't take his meds. He is in AF with RVR, but he is not in resp distress. No overt sign of decompensation resp status wise. We will give iv metoprolol to get better rate control.  Pt was having pain out of proportion to the exam, and with hx of CHF, AF- mesenteric ischemia considered give the elevated WC. There is no clear signs of infection on exam. CT angio abd ordered - and pt has fecal impaction, and he was disimpacted in the ER.  We will admit to medicine for optimization.  Final Clinical Impressions(s) / ED Diagnoses   Final diagnoses:  Acute renal failure superimposed on chronic kidney disease, unspecified CKD stage, unspecified acute renal failure type ([place])  Fecal impaction in rectum [facility])  Generalized weakness    New Prescriptions Current Discharge Medication List       Varney Biles, MD 10/31/16 1209

## 2016-10-31 NOTE — Progress Notes (Signed)
OT Cancellation Note  Patient Details Name: [name] MRN: [id number] DOB: July 27, 1945   Cancelled Treatment:    Reason Eval/Treat Not Completed: Patient declined, no reason specified. Pt adamantly refusing to work with OT at this time, reports he is in pain and does not want to move or talk. Will follow up for OT eval as time allows.  Binnie Kand M.S., OTR/L Pager: [phone]  10/31/2016, 10:19 AM

## 2016-11-01 MED ORDER — LACTULOSE ENEMA
300.0000 mL | Freq: Two times a day (BID) | ORAL | Status: DC
  Filled 2016-11-01: qty 300

## 2016-11-01 MED ORDER — OXYCODONE-ACETAMINOPHEN 5-325 MG PO TABS
2.0000 | ORAL_TABLET | Freq: Once | ORAL | Status: AC
  Administered 2016-11-01: 2 via ORAL
  Filled 2016-11-01: qty 2

## 2016-11-01 MED ORDER — ACETAMINOPHEN 500 MG PO TABS
1000.0000 mg | ORAL_TABLET | Freq: Three times a day (TID) | ORAL | Status: DC | PRN
  Administered 2016-11-01 – 2016-11-05 (×5): 1000 mg via ORAL
  Filled 2016-11-01 (×6): qty 2

## 2016-11-01 MED ORDER — FLEET ENEMA 7-19 GM/118ML RE ENEM
1.0000 | ENEMA | Freq: Once | RECTAL | Status: AC
  Administered 2016-11-01: 1 via RECTAL
  Filled 2016-11-01: qty 1

## 2016-11-01 NOTE — Discharge Instructions (Signed)

## 2016-11-01 NOTE — Progress Notes (Signed)
   Subjective:  Patient feels tired, low appetite. Has some runny stool but no proper bowel movement. Still has abdominal pain. Did not want to work with PT yesterday.  Objective:  Vital signs in last 24 hours: Vitals:   10/31/16 1554 10/31/16 2109 11/01/16 0454 11/01/16 0945  BP: 125/89 (!) 148/90 (!) 127/97 (!) 122/94  Pulse: 96 87 (!) 146 (!) 139  Resp: 18 16 18 18   Temp: 98.2 F (36.8 C) 97.7 F (36.5 C) 98.6 F (37 C) 97.8 F (36.6 C)  TempSrc: Oral Oral Oral Oral  SpO2:  100% 100% 100%  Weight:  128 lb 8.5 oz (58.3 kg)    Height:       General: cachectic, chronically ill appearing man, resting in bed Cardiac: irr irr Pulm: clear to auscultation anteriorly Abd: soft, tender lower quadrants, + BS Ext: warm and well perfused, no pedal edema   Assessment/Plan:   # 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 Patient manually disimpacted in the emergency department on admission. No additional bowel movements. Continues to have abdominal pain. Will try fleet enema today as lactulose enema not available until tomorrow. -- Fleet enema; try lactulose enema tomorrow in no improvement -- Senna 2 tablets BID -- Tramadol Q6h prn for pain, holding home Oxycodone-APAP -- IV Zofran PRN for nausea  # Atrial fibrillation with RVR Mr. Decola has difficult to control atrial fibrillation with rapid ventricular response. Rates better back on home meds. -- Metoprolol succinate 100 mg every morning, 50 mg every evening -- home Digoxin 0.0625 mg daily -- home Apixaban 2.5 mg BID  # Orthostatic hypotension -- home Midodrine 5 mg TID  # Chronic pain -- Tramadol Q6h prn for pain, holding home Oxycodone-APAP given severe constipation  # Failure to thrive  -- continue Boost TID, Marinol 10 mg BID -- PT/OT  # HLD  -- Rosuvastatin 20 mg daily  # COPD  -- home prn Albuterol, Spiriva QD  # Depression  -- Bupropion 12hr 100 mg BID  FEN/GI:Liquiddiet,  replete electrolytes as needed DVT ppx: On Eliquis  Code status: DNI  Dispo: Anticipated discharge in approximately 1-3 day(s).   Zada Finders, MD 11/01/2016, 1:45 PM

## 2016-11-01 NOTE — Evaluation (Signed)
Physical Therapy Evaluation Patient Details Name: [name] MRN: [id number] DOB: 07-Sep-1945 Today's Date: 11/01/2016   History of Present Illness  71 y.o. 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  Clinical Impression  Pt presents with the above diagnosis and below deficits for therapy evaluation. Prior to admission, pt lived alone in an apartment and received occasional assistance from daughter and friends. Pt is not very cooperative with therapy evaluation and refuses any mobility except from chair back to bed. Pt will benefit from continued acute rehab in order to further assess function and safety before discharge home.     Follow Up Recommendations Home health PT    Equipment Recommendations  None recommended by PT    Recommendations for Other Services       Precautions / Restrictions Precautions Precautions: Fall Restrictions Weight Bearing Restrictions: No      Mobility  Bed Mobility Overal bed mobility: Needs Assistance Bed Mobility: Sit to Supine     Supine to sit: Supervision Sit to supine: Supervision   General bed mobility comments: for safety, no physical assist required.   Transfers Overall transfer level: Needs assistance Equipment used: None Transfers: Stand Pivot Transfers   Stand pivot transfers: Min guard       General transfer comment: uunsafe technique. MIn guard for safety. Pt leaning over to complete transfer.   Ambulation/Gait             General Gait Details: pt declined any gait  Stairs            Wheelchair Mobility    Modified Rankin (Stroke Patients Only)       Balance Overall balance assessment: Needs assistance Sitting-balance support: Feet supported;No upper extremity supported Sitting balance-Leahy Scale: Good     Standing balance  support: Bilateral upper extremity supported Standing balance-Leahy Scale: Poor                               Pertinent Vitals/Pain Pain Assessment: Faces Faces Pain Scale: Hurts even more Pain Location: abdomen Pain Descriptors / Indicators: Grimacing;Guarding;Discomfort Pain Intervention(s): Monitored during session;Repositioned    Home Living Family/patient expects to be discharged to:: Private residence Living Arrangements: Alone Available Help at Discharge: Family;Friend(s);Available PRN/intermittently Type of Home: Apartment Home Access: Level entry;Elevator     Home Layout: One level Home Equipment: Walker - 4 wheels;Walker - 2 wheels;Cane - single point;Bedside commode;Wheelchair - Education administrator (comment);Cane - quad;Shower seat;Grab bars - toilet;Grab bars - tub/shower      Prior Function Level of Independence: Independent with assistive device(s)         Comments: Cane/RW/wheelchair for mobility depending on how he is feeling. Mod I with ADL.     Hand Dominance   Dominant Hand: Right    Extremity/Trunk Assessment   Upper Extremity Assessment Upper Extremity Assessment: Defer to OT evaluation    Lower Extremity Assessment Lower Extremity Assessment: Generalized weakness       Communication   Communication: No difficulties  Cognition Arousal/Alertness: Awake/alert Behavior During Therapy: Flat affect Overall Cognitive Status: Within Functional Limits for tasks assessed  General Comments General comments (skin integrity, edema, etc.): pt very hesitant to perform any activity due to not feeling well this session    Exercises     Assessment/Plan    PT Assessment Patient needs continued PT services  PT Problem List Decreased strength;Decreased activity tolerance;Decreased balance;Decreased mobility       PT Treatment Interventions Gait training;Functional mobility  training;Therapeutic activities;Therapeutic exercise;Balance training    PT Goals (Current goals can be found in the Care Plan section)  Acute Rehab PT Goals Patient Stated Goal: feel better PT Goal Formulation: With patient Time For Goal Achievement: 11/15/16 Potential to Achieve Goals: Good    Frequency Min 3X/week   Barriers to discharge        Co-evaluation               AM-PAC PT "6 Clicks" Daily Activity  Outcome Measure Difficulty turning over in bed (including adjusting bedclothes, sheets and blankets)?: None Difficulty moving from lying on back to sitting on the side of the bed? : None Difficulty sitting down on and standing up from a chair with arms (e.g., wheelchair, bedside commode, etc,.)?: Total Help needed moving to and from a bed to chair (including a wheelchair)?: A Little Help needed walking in hospital room?: A Little Help needed climbing 3-5 steps with a railing? : A Little 6 Click Score: 18    End of Session   Activity Tolerance: Patient limited by fatigue Patient left: in bed;with call bell/phone within reach Nurse Communication: Mobility status PT Visit Diagnosis: Unsteadiness on feet (R26.81);Muscle weakness (generalized) (M62.81);Difficulty in walking, not elsewhere classified (R26.2)    Time: 9407-6808 PT Time Calculation (min) (ACUTE ONLY): 14 min   Charges:   PT Evaluation $PT Eval Moderate Complexity: 1 Procedure     PT G Codes:        Scheryl Marten PT, DPT  [phone]   Jacqulyn Liner Sloan Leiter 11/01/2016, 9:32 AM

## 2016-11-01 NOTE — Evaluation (Signed)
Occupational Therapy Evaluation Patient Details Name: [name] MRN: [id number] DOB: May 29, 1945 Today's Date: 11/01/2016    History of Present Illness 71 y.o. 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   Clinical Impression   Pt reports he was mod I with ADL PTA. Currently pt overall min guard for basic transfers and min assist for LB ADL. Pt presenting with deconditioning, pain, generalized weakness, and decreased activity tolerance impacting his independence and safety with ADL and functional mobility. Pt planning to d/c home alone with intermittent supervision from family/friends. Recommending HHOT for follow up to maximize independence and safety with ADL and functional mobility upon return home. Pt would benefit from continued skilled OT to address established goals.    Follow Up Recommendations  Home health OT    Equipment Recommendations  None recommended by OT    Recommendations for Other Services PT consult     Precautions / Restrictions Precautions Precautions: Fall Restrictions Weight Bearing Restrictions: No      Mobility Bed Mobility Overal bed mobility: Needs Assistance Bed Mobility: Supine to Sit     Supine to sit: Supervision     General bed mobility comments: for safety, no physical assist required.   Transfers Overall transfer level: Needs assistance Equipment used: None Transfers: Stand Pivot Transfers   Stand pivot transfers: Min guard       General transfer comment: Unsafe technique but pt not taking cues from therapist. Min guard for safety.    Balance Overall balance assessment: Needs assistance Sitting-balance support: Feet supported;No upper extremity supported Sitting balance-Leahy Scale: Good     Standing balance support: Bilateral upper extremity  supported Standing balance-Leahy Scale: Poor                             ADL either performed or assessed with clinical judgement   ADL Overall ADL's : Needs assistance/impaired Eating/Feeding: Set up;Sitting   Grooming: Set up;Supervision/safety;Sitting;Wash/dry face;Wash/dry hands   Upper Body Bathing: Set up;Supervision/ safety;Sitting   Lower Body Bathing: Minimal assistance;Sit to/from stand   Upper Body Dressing : Set up;Supervision/safety;Sitting   Lower Body Dressing: Minimal assistance;Sit to/from stand   Toilet Transfer: Min Statistician Details (indicate cue type and reason): Simulated by stand pivot EOB to chair         Functional mobility during ADLs: Min guard (for stand pivot only)       Vision         Perception     Praxis      Pertinent Vitals/Pain Pain Assessment: Faces Faces Pain Scale: Hurts even more Pain Location: abdomen Pain Descriptors / Indicators: Grimacing;Guarding;Discomfort Pain Intervention(s): Monitored during session;Limited activity within patient's tolerance;Repositioned     Hand Dominance Right   Extremity/Trunk Assessment Upper Extremity Assessment Upper Extremity Assessment: Generalized weakness   Lower Extremity Assessment Lower Extremity Assessment: Defer to PT evaluation       Communication Communication Communication: No difficulties   Cognition Arousal/Alertness: Awake/alert Behavior During Therapy: Flat affect Overall Cognitive Status: Within Functional Limits for tasks assessed                                     General Comments       Exercises  Shoulder Instructions      Home Living Family/patient expects to be discharged to:: Private residence Living Arrangements: Alone Available Help at Discharge: Family;Friend(s);Available PRN/intermittently Type of Home: Apartment Home Access: Level entry;Elevator     Home Layout: One level      Bathroom Shower/Tub: Occupational psychologist: Standard     Home Equipment: Environmental consultant - 4 wheels;Walker - 2 wheels;Cane - single point;Bedside commode;Wheelchair - Education administrator (comment);Cane - quad;Shower seat;Grab bars - toilet;Grab bars - tub/shower          Prior Functioning/Environment Level of Independence: Independent with assistive device(s)        Comments: Cane/RW/wheelchair for mobility depending on how he is feeling. Mod I with ADL.        OT Problem List: Decreased strength;Decreased activity tolerance;Impaired balance (sitting and/or standing);Decreased safety awareness;Decreased knowledge of use of DME or AE;Pain      OT Treatment/Interventions: Self-care/ADL training;Therapeutic exercise;Energy conservation;DME and/or AE instruction;Therapeutic activities;Patient/family education;Balance training    OT Goals(Current goals can be found in the care plan section) Acute Rehab OT Goals Patient Stated Goal: feel better OT Goal Formulation: With patient Time For Goal Achievement: 11/15/16 Potential to Achieve Goals: Good ADL Goals Pt Will Perform Grooming: with supervision;standing (x3 tasks) Pt Will Perform Upper Body Bathing: sitting;with set-up Pt Will Perform Lower Body Bathing: with supervision;sit to/from stand Pt Will Transfer to Toilet: with supervision;ambulating;bedside commode (over toilet) Pt Will Perform Toileting - Clothing Manipulation and hygiene: with supervision;sit to/from stand Pt Will Perform Tub/Shower Transfer: Shower transfer;with supervision;ambulating;shower seat;rolling walker  OT Frequency: Min 2X/week   Barriers to D/C: Decreased caregiver support  pt lives alone       Co-evaluation              AM-PAC PT "6 Clicks" Daily Activity     Outcome Measure Help from another person eating meals?: None Help from another person taking care of personal grooming?: A Little Help from another person toileting, which includes using  toliet, bedpan, or urinal?: A Little Help from another person bathing (including washing, rinsing, drying)?: A Little Help from another person to put on and taking off regular upper body clothing?: A Little Help from another person to put on and taking off regular lower body clothing?: A Little 6 Click Score: 19   End of Session Equipment Utilized During Treatment: Oxygen Nurse Communication: Mobility status  Activity Tolerance: Patient limited by pain Patient left: in chair;with call bell/phone within reach;with chair alarm set  OT Visit Diagnosis: Unsteadiness on feet (R26.81);Other abnormalities of gait and mobility (R26.89);Muscle weakness (generalized) (M62.81);Pain Pain - part of body:  (stomach)                Time: 7416-3845 OT Time Calculation (min): 11 min Charges:  OT General Charges $OT Visit: 1 Procedure OT Evaluation $OT Eval Moderate Complexity: 1 Procedure G-Codes:     Zannie Runkle A. Ulice Brilliant, M.S., OTR/L Pager: [place] 11/01/2016, 8:50 AM

## 2016-11-01 NOTE — Progress Notes (Signed)
Per SPD retention enema on order.  MD notified.

## 2016-11-01 NOTE — Consult Note (Addendum)
   [facility] Inpatient Consult   11/01/2016  [name] 11/27/1945 [id number]    Patient is currently active with [facility] for chronic disease management services.  Patient has been engaged by a [employer] and CSW. Patient in [place].  Admitted with Atrial Fib with RVR and with multiple hospitalizations in the past 6 months. Our community based plan of care has focused on disease management and community resource support.  Patient will receive a post discharge transition of care call and will be evaluated for monthly home visits for assessments and disease process education.  Made Inpatient Case Manager aware that [facility] following.  Spoke about the community care management team are recommending a Palliative Care Consult.  Inpatient RNCM, Malachy Mood state that the patient might be discharged later on today and not sure if the consult could be done before he would leave.   Came by to speak with patient is in the [place] of bathing and personal care assistance.  Will continue to follow up. Of note, [facility] Care Management services does not replace or interfere with any services that are needed or arranged by inpatient case management or social work.  For additional questions or referrals please contact:  Natividad Brood, RN BSN [facility] Liaison  [phone] business mobile phone Toll free office [phone]

## 2016-11-01 NOTE — Progress Notes (Signed)
Internal Medicine Attending:   I saw and examined the patient. I reviewed the resident's note and I agree with the resident's findings and plan as documented in the resident's note.  71 year old man hospital day #3 with abdominal pain due to an impacted fecaloma causing stercoral colitis. He has not responded to soap suds enema so far. [facility] does not have lactulose enemas, we will try Fleet preparation. May need another attempt at disimpaction. No need for antibiotics. A fib seems at baseline control.

## 2016-11-02 LAB — BASIC METABOLIC PANEL
Anion gap: 7 (ref 5–15)
BUN: 42 mg/dL — AB (ref 6–20)
CHLORIDE: 109 mmol/L (ref 101–111)
CO2: 21 mmol/L — AB (ref 22–32)
CREATININE: 2 mg/dL — AB (ref 0.61–1.24)
Calcium: 8.8 mg/dL — ABNORMAL LOW (ref 8.9–10.3)
GFR calc Af Amer: 37 mL/min — ABNORMAL LOW (ref >60)
GFR calc non Af Amer: 32 mL/min — ABNORMAL LOW (ref >60)
Glucose, Bld: 120 mg/dL — ABNORMAL HIGH (ref 65–99)
POTASSIUM: 4.3 mmol/L (ref 3.5–5.1)
Sodium: 137 mmol/L (ref 135–145)

## 2016-11-02 MED ORDER — FLEET ENEMA 7-19 GM/118ML RE ENEM
1.0000 | ENEMA | Freq: Once | RECTAL | Status: AC
  Administered 2016-11-02: 1 via RECTAL
  Filled 2016-11-02: qty 1

## 2016-11-02 MED ORDER — DEXTROSE-NACL 5-0.45 % IV SOLN
INTRAVENOUS | Status: AC
  Administered 2016-11-02 (×2): via INTRAVENOUS

## 2016-11-02 MED ORDER — LACTULOSE ENEMA
300.0000 mL | Freq: Two times a day (BID) | ORAL | Status: DC
  Filled 2016-11-02: qty 300

## 2016-11-02 NOTE — Progress Notes (Signed)
Pharmacist Heart Failure Core Measure Documentation  Assessment: [name] has an EF documented as 30% on 07/2015 by ECHO.  Rationale: Heart failure patients with left ventricular systolic dysfunction (LVSD) and an EF < 40% should be prescribed an angiotensin converting enzyme inhibitor (ACEI) or angiotensin receptor blocker (ARB) at discharge unless a contraindication is documented in the medical record.  This patient is not currently on an ACEI or ARB for HF.  This note is being placed in the record in order to provide documentation that a contraindication to the use of these agents is present for this encounter.  ACE Inhibitor or Angiotensin Receptor Blocker is contraindicated (specify all that apply)  []   ACEI allergy AND ARB allergy []   Angioedema []   Moderate or severe aortic stenosis []   Hyperkalemia [x]   Hypotension []   Renal artery stenosis []   Worsening renal function, preexisting renal disease or dysfunction   Carlean Jews 11/02/2016 3:29 PM

## 2016-11-02 NOTE — Progress Notes (Signed)
   Subjective:  Patient reports some small bowel movements overnight, now beginning to form. Appetite still poor.  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 Patient manually disimpacted in the emergency department on admission. Some stool production, starting to form now. Fleet enema seemed to help, will repeat today. Will give IVF as he is volume down. -- Fleet enema today -- Senna 2 tablets BID -- Tramadol Q6h prn for pain -- IV Zofran PRN for nausea -- D5-1/2NS @ 100 mL/hr for 15 hours  # Atrial fibrillation with RVR Mr. Cournoyer has difficult to control atrial fibrillation with rapid ventricular response. Rates high, likely secondary to dehydration. Adding IVF as above. Monitor volume status. -- Metoprolol succinate 100 mg every morning, 50 mg every evening -- home Digoxin 0.0625 mg daily -- home Apixaban 2.5 mg BID  # Orthostatic hypotension -- home Midodrine 5 mg TID  # Chronic pain -- Tramadol Q6h prn for pain, holding home Oxycodone-APAP given severe constipation  # Failure to thrive -- continue Boost TID, Marinol 10 mg BID -- PT/OT  # HLD  -- Rosuvastatin 20 mg daily  # COPD  -- home prn Albuterol, Spiriva QD  # Depression  -- Bupropion 12hr 100 mg BID  FEN/GI:Liquiddiet, replete electrolytes as needed DVT ppx: On Eliquis  Code status:  DNI  Dispo: Anticipated discharge in approximately 1-3 day(s).   Zada Finders, MD 11/02/2016, 9:26 AM

## 2016-11-03 LAB — BASIC METABOLIC PANEL
Anion gap: 8 (ref 5–15)
BUN: 38 mg/dL — AB (ref 6–20)
CO2: 21 mmol/L — ABNORMAL LOW (ref 22–32)
CREATININE: 1.73 mg/dL — AB (ref 0.61–1.24)
Calcium: 8.5 mg/dL — ABNORMAL LOW (ref 8.9–10.3)
Chloride: 109 mmol/L (ref 101–111)
GFR calc non Af Amer: 38 mL/min — ABNORMAL LOW (ref >60)
GFR, EST AFRICAN AMERICAN: 44 mL/min — AB (ref >60)
Glucose, Bld: 105 mg/dL — ABNORMAL HIGH (ref 65–99)
Potassium: 3.8 mmol/L (ref 3.5–5.1)
SODIUM: 138 mmol/L (ref 135–145)

## 2016-11-03 IMAGING — DX DG CHEST 1V PORT
1 series · 1 of 1 positions shown · non-contrast
Comparison: Chest radiograph dated 08/12/2015

CLINICAL DATA: 69-year-old male with endotracheal tube placement.

EXAM:
PORTABLE CHEST 1 VIEW

[chest ap]
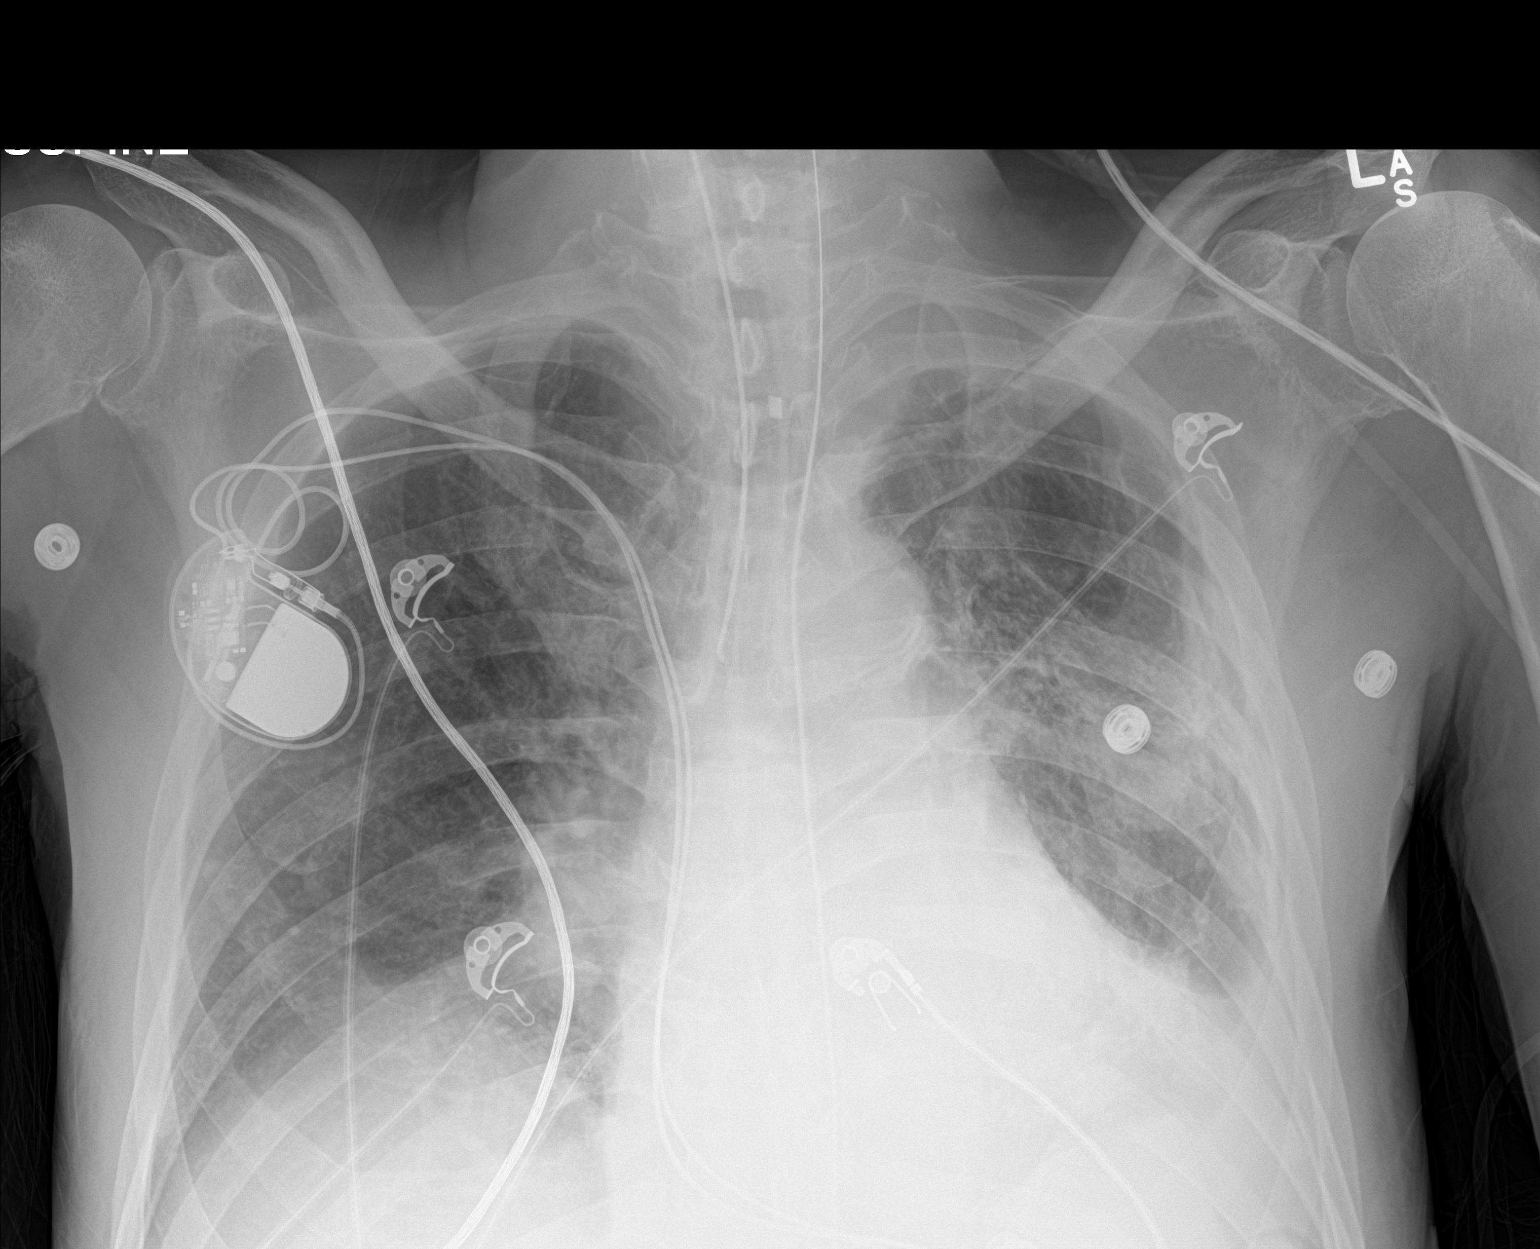

[1 of 1 positions shown; findings below may reference images not displayed]

FINDINGS: There has been interval placement of and endotracheal tube the tip
approximately 2 cm above the carina. Recommend retraction and
repositioning of the endotracheal tube by 3- 4 cm for optimal
positioning. An enteric tube is partially visualized coursing
towards the left upper abdomen with tip beyond the image margin.
Focal area of consolidation is again noted in the left mid lung
field. Left apical pleural thickening appears stable. There is
stable moderate left pleural effusion. No pneumothorax. Stable
cardiac silhouette. Right pectoral pacemaker device. No acute
osseous pathology.
IMPRESSION: Interval placement of an endotracheal tube with tip approximately 2
cm above the carina. Recommend retraction by approximately 3-4 cm
for optimal positioning.

Left mid lung field opacity and left pleural effusion appears
similar to prior study. Follow-up recommended to resolution.

## 2016-11-03 IMAGING — CR DG CHEST 1V PORT
1 series · 1 of 1 positions shown · non-contrast
Comparison: Chest radiograph August 04, 2015 and June 16, 2015

CLINICAL DATA: Shortness of breath, weakness, recent pneumonia.
History of hypertension, COPD, lung nodule.

EXAM:
PORTABLE CHEST 1 VIEW

[AP]
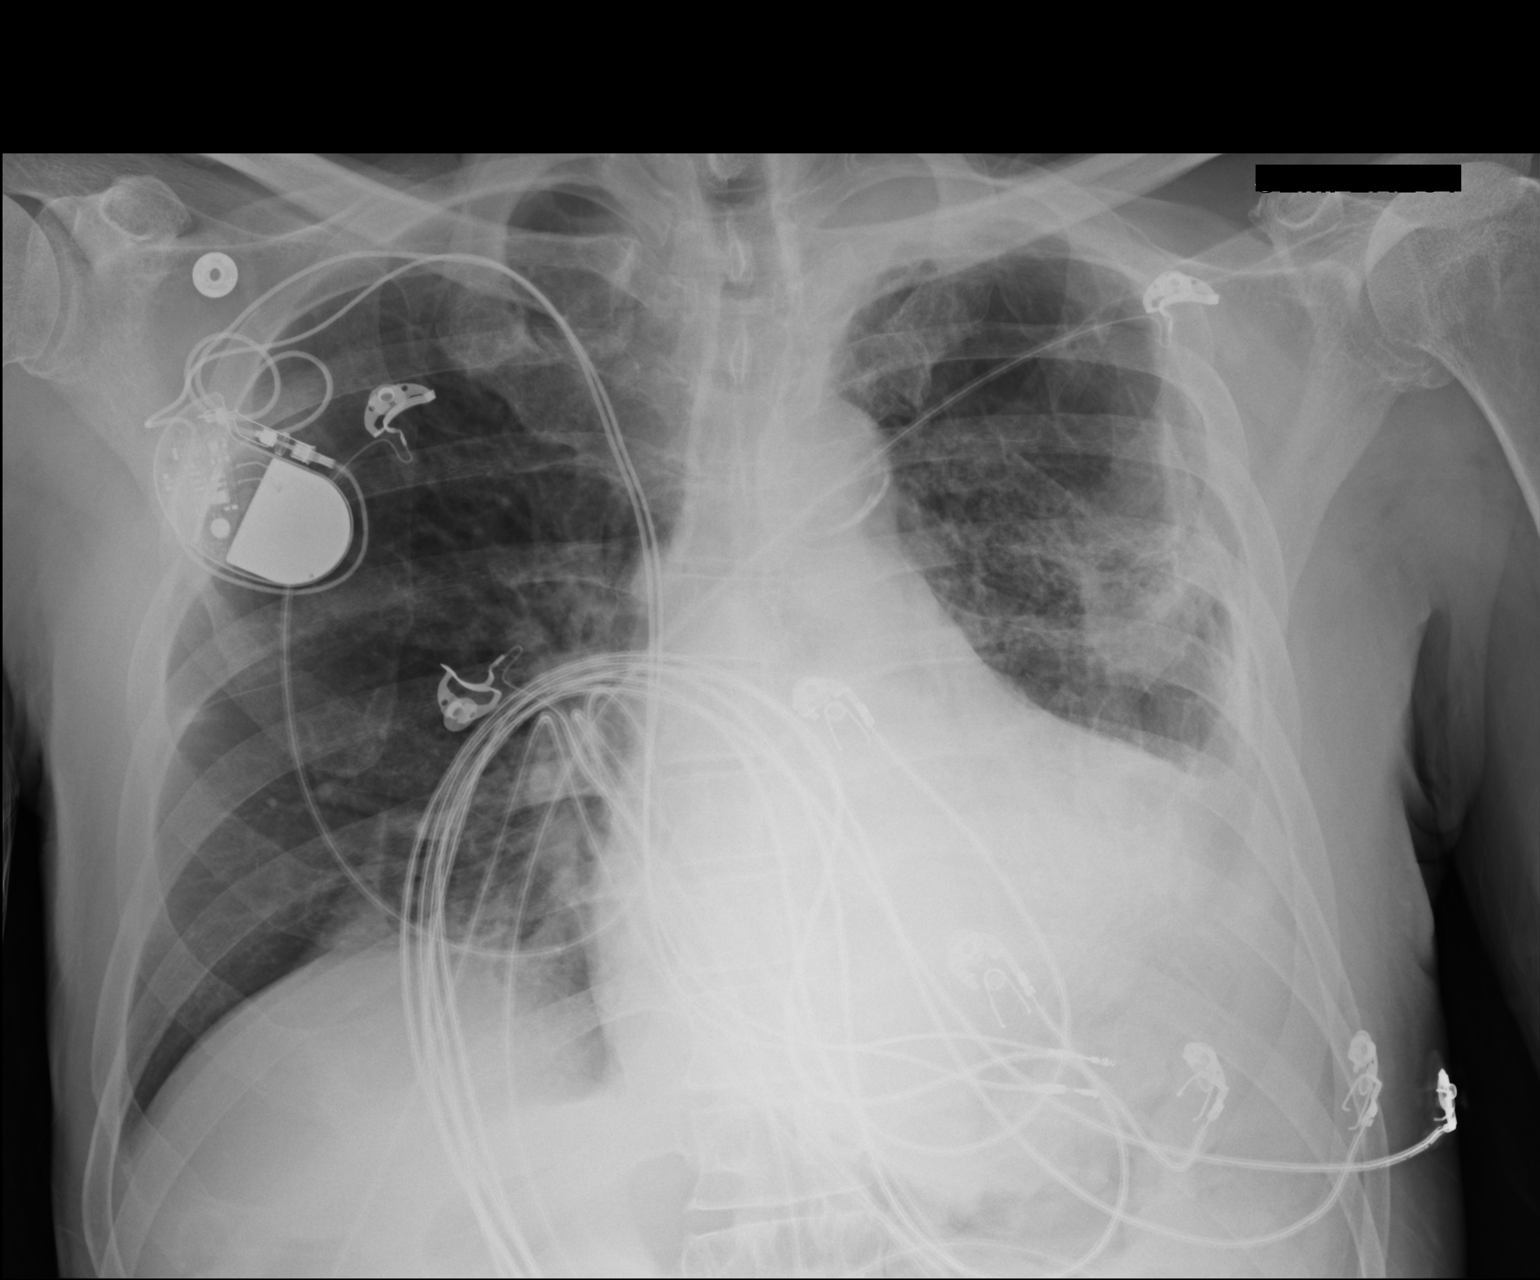

[1 of 1 positions shown; findings below may reference images not displayed]

FINDINGS: Similar to worsening LEFT midlung zone consolidation, with stable
LEFT apical pleural thickening. Moderate suspected LEFT pleural
effusion associated with known elevated hemidiaphragm. Strandy
densities RIGHT lung base. Cardiac silhouette is upper limits of
normal in size, mediastinal silhouette is nonsuspicious, calcified
aortic knob. Dual lead RIGHT cardiac pacemaker in situ. Interval
removal of nasogastric tube. Osseous structures are nonsuspicious.
IMPRESSION: Similar to worsening LEFT midlung zone consolidation with small LEFT
pleural effusion, stable LEFT apical pleural thickening. Followup PA
and lateral chest X-ray is recommended in 3-4 weeks following trial
of antibiotic therapy to ensure resolution and exclude underlying
malignancy.

Mild cardiomegaly.  RIGHT lung base atelectasis.

## 2016-11-03 IMAGING — DX DG CHEST 1V PORT
1 series · 1 of 1 positions shown · non-contrast
Comparison: Earlier same day; 08/04/2015; 06/16/2015

CLINICAL DATA: Central line placement

EXAM:
PORTABLE CHEST 1 VIEW

[chest ap]
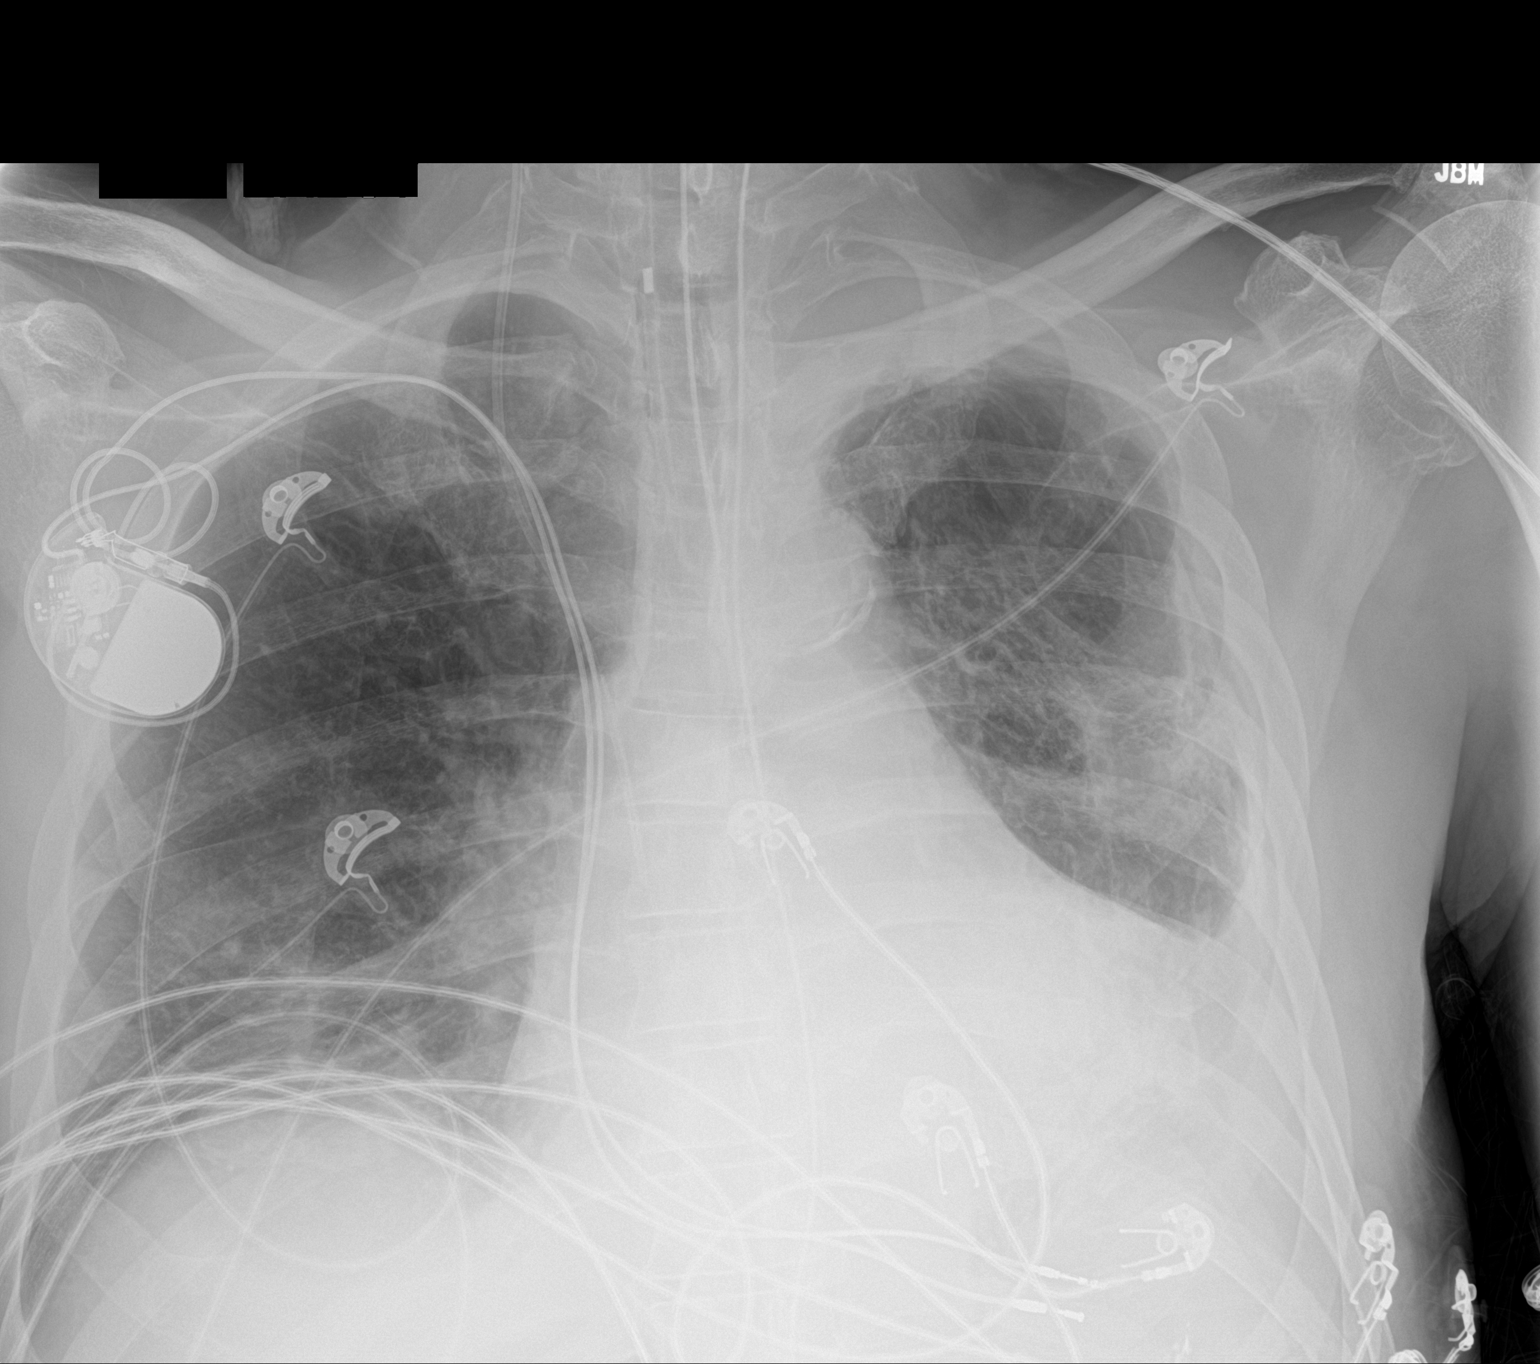

[1 of 1 positions shown; findings below may reference images not displayed]

FINDINGS: Interval placement of right jugular approach central venous catheter
with tip projected over the superior cavoatrial junction. Stable
position remaining support apparatus. No pneumothorax.

Grossly unchanged cardiac silhouette and mediastinal contours with
atherosclerotic plaque within the thoracic aorta.

Pulmonary vasculature remains indistinct with cephalization of flow.
Unchanged small left-sided effusion with associated bibasilar
heterogeneous/consolidative opacities, left greater than right.
Linear heterogeneous opacities with the peripheral aspect the left
mid lung are unchanged in favored to represent atelectasis or scar.
Unchanged bones.
IMPRESSION: 1. No right jugular approach intravenous catheter tip projects over
the superior cavoatrial junction. Otherwise, stable position of
support apparatus. No pneumothorax.
2. Similar findings most suggestive pulmonary edema superimposed on
advanced emphysematous change and background parenchymal
abnormalities favored to represent atelectasis or scar.
3. Unchanged small left-sided effusion with associated bibasilar
opacities, left greater than right, atelectasis versus infiltrate.

## 2016-11-03 MED ORDER — MILK AND MOLASSES ENEMA
1.0000 | Freq: Once | RECTAL | Status: AC
  Administered 2016-11-03: 250 mL via RECTAL
  Filled 2016-11-03: qty 250

## 2016-11-03 NOTE — Progress Notes (Signed)
   Subjective: Patient seen and evaluated today at bedside. Having some liquid BMs however yet to have large-volume per RN. Pt requested fruit overnight, diet changed to soft. Tolerated fruit cup.   Objective:  Vital signs in last 24 hours: Vitals:   11/02/16 0955 11/02/16 1709 11/02/16 2024 11/03/16 0514  BP: 111/69 113/75 (!) 146/88 123/73  Pulse: 78 100 79 99  Resp: 18  18 18   Temp: 98.2 F (36.8 C) 98.6 F (37 C) 98.7 F (37.1 C) 98.6 F (37 C)  TempSrc: Oral Oral Oral Oral  SpO2: 99% 99% 98% 96%  Weight:    129 lb 6.6 oz (58.7 kg)  Height:       General: cachectic, chronically ill appearing man, resting in bed. Fatigued.  Cardiac: IRIR but otherwise nml.  Pulm: clear to auscultation anterior chest BL. Normal WOB on RA. 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 Large stool burden S/p manual disimpaction in ED. Pt has refused bowel regimen at home for some time. Fleet enema x2 resulting in some liquid BMs. Patient hasn't yet to have large volume BM. Pt tolerated fruit cup today but without much appetite. Received some IVF yesterday.  -Milk & molasses enema today -Senokot 2 tabs BID -Tramadol Q6h prn for pain -IV Zofran PRN for nausea  #Atrial fibrillation with RVR W/ difficult to control AFib with RVR. Rates much better now following IVF replacement. Will continue to monitor volume status.  -Metoprolol succinate 100 mg every morning, 50 mg every evening -Digoxin 0.0625 mg daily -Apixaban 2.5 mg BID  #Orthostatic hypotension -home Midodrine 5 mg TID  #Chronic pain -Tramadol Q6h prn for pain -Holding home Oxy-APAP given degree of constipation  #Failure to thrive -continue Boost TID, Marinol 10 mg BI -Remeron QHS -PT/OT  #COPD  -home prn Albuterol, Spiriva QD  #Depression-Bupropion 12hr 100 mg BID  FEN/GI:Soft DVT ppx: On Eliquis  Code  status: DNI  Dispo: Anticipated discharge in approximately 1-3 day(s).   Kindell Strada, DO 11/03/2016, 7:29 AM

## 2016-11-04 IMAGING — CR DG CHEST 1V PORT
1 series · 1 of 1 positions shown · non-contrast
Comparison: 08/12/2015

CLINICAL DATA: Acute respiratory failure

EXAM:
PORTABLE CHEST 1 VIEW

[AP]
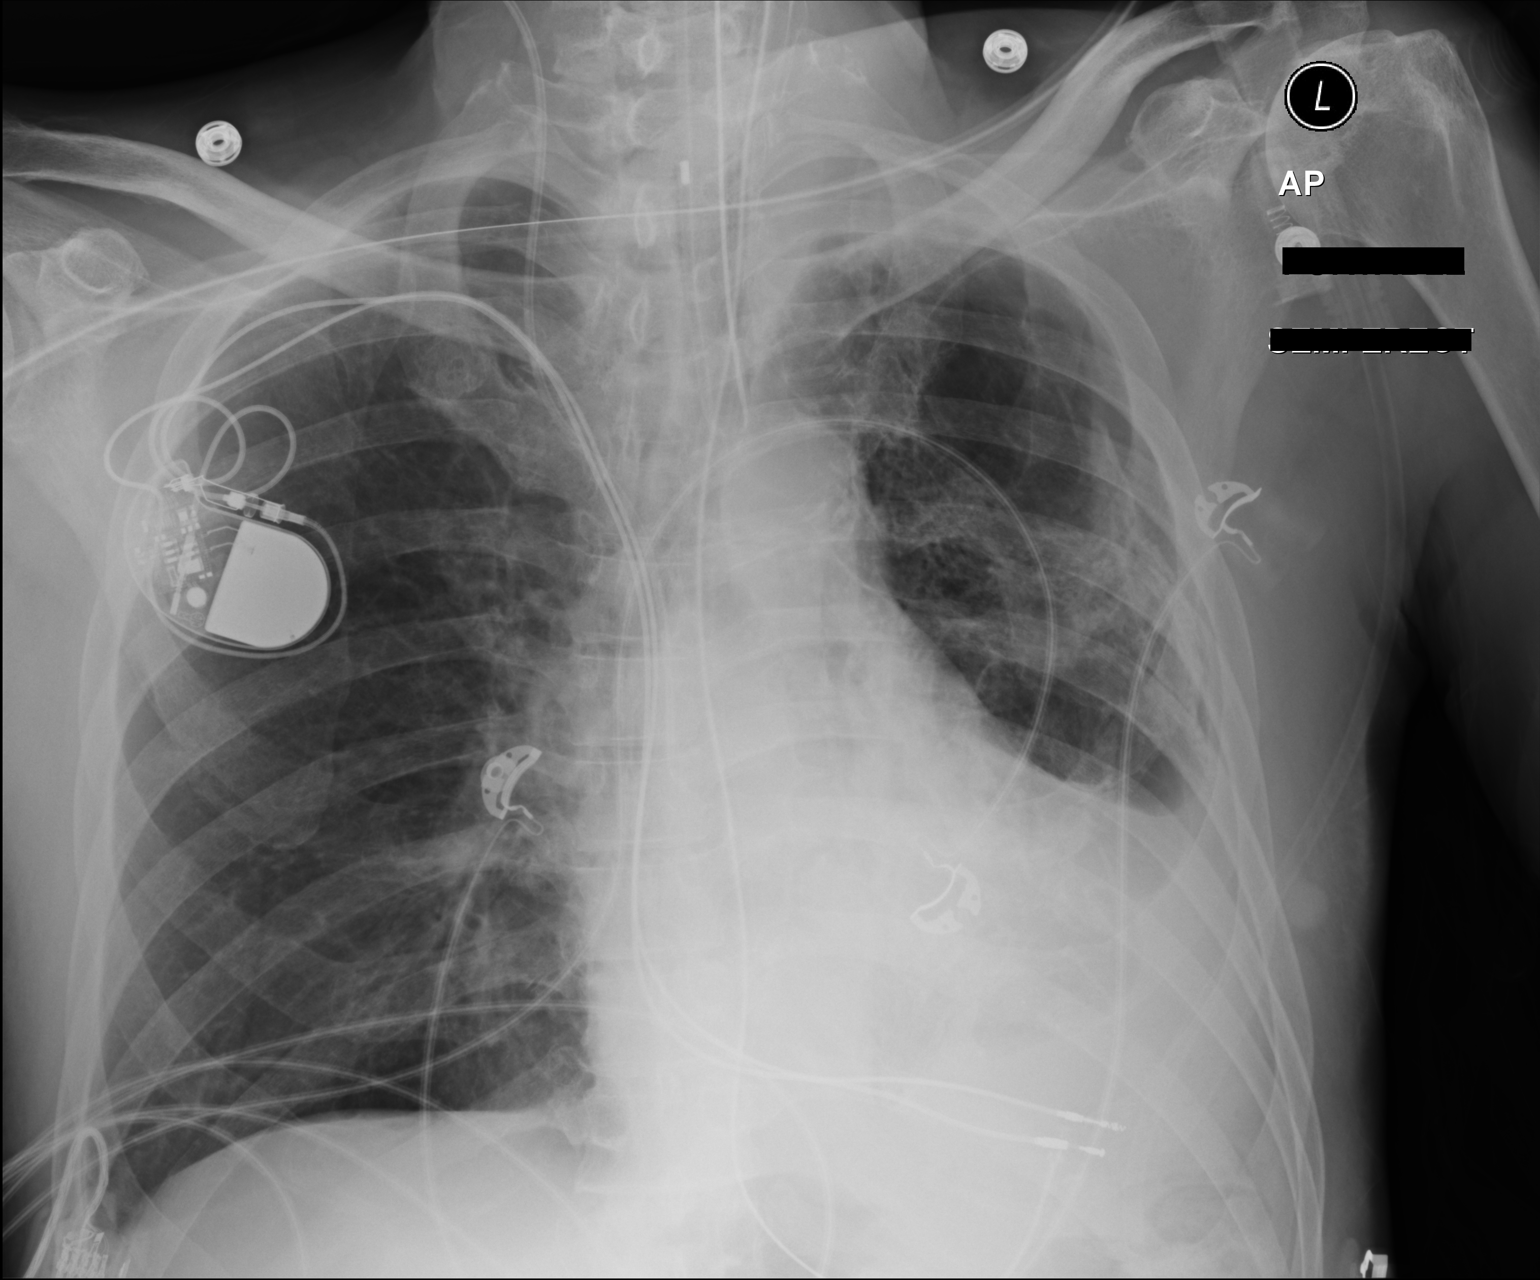

[1 of 1 positions shown; findings below may reference images not displayed]

FINDINGS: Cardiomediastinal silhouette is stable. Endotracheal tube in place
with tip 4.1 cm above the carina. Stable NG tube position. Stable
right IJ central line position. Again noted scarring and pleural
thickening in left midlung and left upper lobe. Probable small left
pleural effusion with left basilar atelectasis or infiltrate. No
pulmonary edema. Right lung is clear. Dual lead cardiac pacemaker is
unchanged in position.
IMPRESSION: Stable support apparatus. Again noted scarring and pleural
thickening in left midlung and left upper lobe. Probable small left
pleural effusion with left basilar atelectasis or infiltrate. No
pulmonary edema. Right lung is clear.

## 2016-11-04 MED ORDER — MILK AND MOLASSES ENEMA
1.0000 | Freq: Once | RECTAL | Status: AC
  Administered 2016-11-04: 250 mL via RECTAL
  Filled 2016-11-04 (×2): qty 250

## 2016-11-04 MED ORDER — SODIUM CHLORIDE 0.9 % IV SOLN
INTRAVENOUS | Status: AC
  Administered 2016-11-04: 08:00:00 via INTRAVENOUS

## 2016-11-04 MED ORDER — NALOXEGOL OXALATE 12.5 MG PO TABS
12.5000 mg | ORAL_TABLET | Freq: Every day | ORAL | Status: DC
  Administered 2016-11-04 – 2016-11-05 (×2): 12.5 mg via ORAL
  Filled 2016-11-04 (×2): qty 1

## 2016-11-04 MED ORDER — POLYETHYLENE GLYCOL 3350 17 G PO PACK
17.0000 g | PACK | Freq: Every day | ORAL | Status: DC
  Administered 2016-11-04: 17 g via ORAL
  Filled 2016-11-04 (×2): qty 1

## 2016-11-04 NOTE — Progress Notes (Signed)
Physical Therapy Treatment Patient Details Name: [name] MRN: [id number] DOB: 11/26/1945 Today's Date: 11/04/2016    History of Present Illness 71 y.o. 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    PT Comments    Continuing work on functional mobility and activity tolerance;  Able to walk farther today with encouragement; Reports feeling a bit better and seemed happy with walk; making solid progress towards goals   Follow Up Recommendations  Home health PT     Equipment Recommendations  None recommended by PT    Recommendations for Other Services       Precautions / Restrictions Precautions Precautions: Fall Restrictions Weight Bearing Restrictions: No    Mobility  Bed Mobility Overal bed mobility: Needs Assistance Bed Mobility: Supine to Sit     Supine to sit: Supervision     General bed mobility comments: for safety, no physical assist required.   Transfers Overall transfer level: Needs assistance Equipment used: Rolling walker (2 wheeled) Transfers: Sit to/from Stand Sit to Stand: Min assist         General transfer comment: min assist to power up  Ambulation/Gait Ambulation/Gait assistance: Min guard Ambulation Distance (Feet): 60 Feet Assistive device: Rolling walker (2 wheeled) Gait Pattern/deviations: Step-through pattern     General Gait Details: Rw sized up for optimal fit; walked with 2 L supplemental O2; Cues to self-monitor for activity tolerance   Stairs            Wheelchair Mobility    Modified Rankin (Stroke Patients Only)       Balance     Sitting balance-Leahy Scale: Good       Standing balance-Leahy Scale: Poor                              Cognition Arousal/Alertness: Awake/alert Behavior During Therapy: WFL for tasks  assessed/performed Overall Cognitive Status: Within Functional Limits for tasks assessed                                        Exercises      General Comments        Pertinent Vitals/Pain Pain Assessment: Faces Pain Score: 7  Faces Pain Scale: Hurts a little bit Pain Location: abdomen, and grimace may be related to effort Pain Descriptors / Indicators: Grimacing;Discomfort Pain Intervention(s): Monitored during session    Home Living                      Prior Function            PT Goals (current goals can now be found in the care plan section) Acute Rehab PT Goals Patient Stated Goal: feel better PT Goal Formulation: With patient Time For Goal Achievement: 11/15/16 Potential to Achieve Goals: Good Progress towards PT goals: Progressing toward goals    Frequency    Min 3X/week      PT Plan Current plan remains appropriate    Co-evaluation              AM-PAC PT "6 Clicks" Daily Activity  Outcome Measure  Difficulty turning over in bed (including adjusting bedclothes, sheets and blankets)?: None Difficulty moving from lying on back  to sitting on the side of the bed? : None Difficulty sitting down on and standing up from a chair with arms (e.g., wheelchair, bedside commode, etc,.)?: A Lot Help needed moving to and from a bed to chair (including a wheelchair)?: A Little Help needed walking in hospital room?: A Little Help needed climbing 3-5 steps with a railing? : A Little 6 Click Score: 19    End of Session Equipment Utilized During Treatment: Gait belt Activity Tolerance: Patient tolerated treatment well Patient left: in chair;with call bell/phone within reach;with chair alarm set Nurse Communication: Mobility status PT Visit Diagnosis: Unsteadiness on feet (R26.81);Muscle weakness (generalized) (M62.81);Difficulty in walking, not elsewhere classified (R26.2)     Time: 6734-1937 PT Time Calculation (min) (ACUTE ONLY):  28 min  Charges:  $Gait Training: 23-37 mins                    G Codes:       Roney Marion, [place] Pager [phone] Office [place] 11/04/2016, 4:59 PM

## 2016-11-04 NOTE — Care Management Note (Signed)
Case Management Note  Patient Details  Name: MATHESON VANDEHEI MRN: [id number] Date of Birth: [birth year]  Subjective/Objective:     CM following for progression and d/c planning.                Action/Plan: 11/04/2016 Noted plan to begin Movantik  Request sent to insurance provided and this CM informed that pt copay will be $8.35 as this pt has low income support. This medication is normally very expensive so hopefully this price will allow the pt to purchase this medication if it proves to be helpful . HH with [facility] and [facility] for ongoing followup is in place.   Expected Discharge Date:  11/05/2016               Expected Discharge Plan:  [place]  In-House Referral:  NA  Discharge planning Services  CM Consult  Post Acute Care Choice:  Home Health Choice offered to:  Patient  DME Arranged:   NA DME Agency:   NA  HH Arranged:  RN, PT [place] Agency:  [place]  Status of Service:  In process, will continue to follow  If discussed at Long Length of Stay Meetings, dates discussed:    Additional Comments:  Adron Bene, RN 11/04/2016, 4:16 PM

## 2016-11-04 NOTE — Progress Notes (Signed)
Attempted to administer enema twice, patient continues to refuse. Will try again later

## 2016-11-04 NOTE — Care Management Important Message (Signed)
Important Message  Patient Details  Name: [name] MRN: [id number] Date of Birth: Oct 31, 1945   Medicare Important Message Given:  Yes    Orbie Pyo 11/04/2016, 1:46 PM

## 2016-11-04 NOTE — Progress Notes (Signed)
Medicine attending: I examined this patient today and I concur with the evaluation and management plan which will be recorded by resident physician Dr. Romelle Starcher Molt. He is starting to respond to laxatives and enemas.  Abdomen is soft and nontender.  We will begin a trial of Movantk to counteract chronic opioid related constipation. Heart rate currently controlled on long-acting metoprolol 100 mg in the morning and 50 mg in the afternoon. Anticipate discharge within the next 24 hours.

## 2016-11-04 NOTE — Progress Notes (Signed)
   Subjective: Patient seen and evaluated today at bedside. Had several large-volume BMs yesterday per RN and patient. Abdominal pain resolved. Ate all of breakfast without N/v.   Objective:  Vital signs in last 24 hours: Vitals:   11/03/16 0914 11/03/16 1704 11/03/16 2008 11/04/16 0419  BP: 123/89 92/67 (!) 87/54 92/71  Pulse: 63 76 83 71  Resp: 20 18 18 18   Temp: 98.4 F (36.9 C) 98.5 F (36.9 C) 99.2 F (37.3 C) 98.2 F (36.8 C)  TempSrc: Oral Oral Oral Oral  SpO2: 99% 100% 100% 100%  Weight:   124 lb 12.5 oz (56.6 kg)   Height:       General: Cachectic appearing AA male, resting in bed. NAD Cardiac: IRIR but otherwise nml. Rate controlled.  Pulm: CTA BL with normal WOB on RA. Abd: Soft, non-tender and not distended.  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 Had several large volume BMs yesterday and his abdominal pain and fullness have improved significantly. Have started a trial of Movantik to offset his constipation related to chronic opioid abuse. Have ordered another M&M enema however refuses.   -Senokot 2 tabs BID -Miralax daily -Tramadol Q6h prn for pain -IV Zofran PRN for nausea -Will give an additional 1L slowly throughout the course of the day to help with bowel transit and his low PO intake  #Atrial fibrillation with RVR W/ hx of difficult to control AFib with RVR. Rates much improved since IVF replacement. Appears well controlled on home dose of Metoprolol succinate 100 mg every morning, 50 mg every evening -Digoxin 0.0625 mg daily -Apixaban 2.5 mg BID  #Orthostatic hypotension -Midodrine 5 mg TID  #Chronic pain -Tramadol Q6h prn  -Holding home Oxy-APAP given constipation  #Failure to thrive -Continue Boost TID, Marinol 10 mg BI -Remeron QHS -PT/OT  #COPD   -home prn Albuterol, Spiriva QD  #Depression-Bupropion 12hr 100 mg BID  Dispo: Anticipated discharge TOMORROW.   Lethia Donlon, DO 11/04/2016, 6:51 AM

## 2016-11-04 NOTE — Progress Notes (Signed)
OT Cancellation    11/04/16 0900  OT Visit Information  Last OT Received On 11/04/16  Reason Eval/Treat Not Completed Patient declined, no reason specified (Pt stating he is too tired. Encouraged OOB, but pt continues to decline. Will return as schedule allows.)   Cassidey Barrales MSOT, OTR/L Acute Rehab Pager: [phone] Office: [phone]

## 2016-11-04 NOTE — Telephone Encounter (Signed)
rtc to [place], informed pt is inpt

## 2016-11-05 ENCOUNTER — Other Ambulatory Visit: Payer: Self-pay | Admitting: Internal Medicine

## 2016-11-05 DIAGNOSIS — F329 Major depressive disorder, single episode, unspecified: Secondary | ICD-10-CM

## 2016-11-05 DIAGNOSIS — I429 Cardiomyopathy, unspecified: Secondary | ICD-10-CM

## 2016-11-05 DIAGNOSIS — I482 Chronic atrial fibrillation: Secondary | ICD-10-CM

## 2016-11-05 DIAGNOSIS — N400 Enlarged prostate without lower urinary tract symptoms: Secondary | ICD-10-CM

## 2016-11-05 DIAGNOSIS — I480 Paroxysmal atrial fibrillation: Secondary | ICD-10-CM

## 2016-11-05 MED ORDER — NICOTINE 7 MG/24HR TD PT24: 7.0000 mg | MEDICATED_PATCH | Freq: Every day | TRANSDERMAL | 0 refills | Status: DC

## 2016-11-05 MED ORDER — NALOXEGOL OXALATE 12.5 MG PO TABS: 12.5000 mg | ORAL_TABLET | Freq: Every day | ORAL | 1 refills | Status: DC

## 2016-11-05 MED ORDER — POLYETHYLENE GLYCOL 3350 17 G PO PACK: 17.0000 g | PACK | Freq: Every day | ORAL | 0 refills | Status: DC

## 2016-11-05 MED ORDER — SENNOSIDES-DOCUSATE SODIUM 8.6-50 MG PO TABS: 2.0000 | ORAL_TABLET | Freq: Two times a day (BID) | ORAL | 1 refills | Status: DC

## 2016-11-05 MED ORDER — OXYCODONE-ACETAMINOPHEN 5-325 MG PO TABS
1.0000 | ORAL_TABLET | Freq: Once | ORAL | Status: AC
  Administered 2016-11-05: 2 via ORAL
  Filled 2016-11-05: qty 2

## 2016-11-05 MED ORDER — POLYETHYLENE GLYCOL 3350 17 G PO PACK: 17.0000 g | PACK | Freq: Every day | ORAL | 0 refills | Status: DC | PRN

## 2016-11-05 MED ORDER — MIRTAZAPINE 15 MG PO TABS: 15.0000 mg | ORAL_TABLET | Freq: Every day | ORAL | 1 refills | Status: DC

## 2016-11-05 NOTE — Progress Notes (Signed)
[name] to be D/C'd Home per MD order.  Discussed prescriptions and follow up appointments with the patient. Prescriptions given to patient, medication list explained in detail. Pt verbalized understanding.  Allergies as of 11/05/2016      Reactions   Amiodarone Other (See Comments)   Thyroiditis in the setting of amiodarone use   Penicillins Rash, Other (See Comments)   Has patient had a PCN reaction causing immediate rash, facial/tongue/throat swelling, SOB or lightheadedness with hypotension: Yes Has patient had a PCN reaction causing severe rash involving mucus membranes or skin necrosis: No Has patient had a PCN reaction that required hospitalization No Has patient had a PCN reaction occurring within the last 10 years: No If all of the above answers are "NO", then may proceed with Cephalosporin use.      Medication List    STOP taking these medications   Meclizine HCl 25 MG Chew     TAKE these medications   albuterol 108 (90 Base) MCG/ACT inhaler Commonly known as:  VENTOLIN HFA Inhale 2 puffs into the lungs every 6 (six) hours as needed for wheezing or shortness of breath.   apixaban 2.5 MG Tabs tablet Commonly known as:  ELIQUIS Take 1 tablet (2.5 mg total) by mouth 2 (two) times daily.   aspirin 81 MG chewable tablet Chew 1 tablet (81 mg total) by mouth daily.   buPROPion 100 MG 12 hr tablet Commonly known as:  WELLBUTRIN SR Take 1 tablet (100 mg total) by mouth 2 (two) times daily.   Digoxin 62.5 MCG Tabs Take 0.0625 mg by mouth daily.   dronabinol 10 MG capsule Commonly known as:  MARINOL Take 1 capsule (10 mg total) by mouth 2 (two) times daily before lunch and supper.   famotidine 20 MG tablet Commonly known as:  PEPCID Take 1 tablet (20 mg total) by mouth daily.   feeding supplement Liqd Take 1 Container by mouth 3 (three) times daily between meals.   metoprolol succinate 50 MG 24 hr tablet Commonly known as:  TOPROL-XL Take 100 mg in the morning  and 50 mg in the evening. Take with or immediately following a meal.   midodrine 5 MG tablet Commonly known as:  PROAMATINE Take 1 tablet (5 mg total) by mouth 3 (three) times daily with meals.   mirtazapine 15 MG tablet Commonly known as:  REMERON Take 1 tablet (15 mg total) by mouth at bedtime.   naloxegol oxalate 12.5 MG Tabs tablet Commonly known as:  MOVANTIK Take 1 tablet (12.5 mg total) by mouth daily before breakfast. Start taking on:  11/06/2016   nicotine 7 mg/24hr patch Commonly known as:  NICODERM CQ - dosed in mg/24 hr Place 1 patch (7 mg total) onto the skin at bedtime.   omeprazole 40 MG capsule Commonly known as:  PRILOSEC Take 40 mg by mouth daily.   ondansetron 4 MG tablet Commonly known as:  ZOFRAN Take 1 tablet (4 mg total) by mouth every 8 (eight) hours as needed for nausea or vomiting.   oxyCODONE-acetaminophen 10-325 MG tablet Commonly known as:  PERCOCET Take 1 tablet by mouth every 4 (four) hours as needed for pain. Do not take more than 5 tablets in 24 hours.   polyethylene glycol packet Commonly known as:  MIRALAX / GLYCOLAX Take 17 g by mouth daily as needed. What changed:  when to take this  reasons to take this   rosuvastatin 20 MG tablet Commonly known as:  CRESTOR TAKE 1  TABLET EVERY DAY AT 6 PM What changed:  See the new instructions.   senna-docusate 8.6-50 MG tablet Commonly known as:  Senokot-S Take 2 tablets by mouth 2 (two) times daily.   tiotropium 18 MCG inhalation capsule Commonly known as:  SPIRIVA Place 1 capsule (18 mcg total) into inhaler and inhale daily.       Vitals:   11/05/16 0520 11/05/16 0847  BP: 96/65 102/63  Pulse: (!) 53 60  Resp: 16 16  Temp: 97.8 F (36.6 C) 98.4 F (36.9 C)    Skin clean, dry and intact without evidence of skin break down, no evidence of skin tears noted. IV catheter discontinued intact. Site without signs and symptoms of complications. Dressing and pressure applied. Pt denies  pain at this time. No complaints noted.  An After Visit Summary was printed and given to the patient. Patient escorted via [place], and D/C home via private auto.  Emilio Math, RN [facility] Phone [phone]

## 2016-11-05 NOTE — Discharge Summary (Signed)
Name: [name] MRN: [id number] DOB: 10/31/1945 71 y.o. PCP: Zada Finders, MD  Date of Admission: 10/30/2016  5:20 PM Date of Discharge: 11/05/2016 Attending Physician: Annia Belt, MD  Discharge Diagnosis: 1. Fecal Impaction secondary to opioid-related constipation 2. Dehydration, Acute on Chronic Renal Insufficiency 3. Afib with RVR  Principal Problem:   Fecal impaction (HCC) Active Problems:   History of tobacco abuse   Essential hypertension   Cardiac pacemaker in situ   COPD (chronic obstructive pulmonary disease) (HCC)   Chronic anticoagulation   Major depressive disorder, recurrent episode (HCC)   Nonischemic cardiomyopathy (HCC)   Chronic combined systolic and diastolic congestive heart failure (HCC)   PVD (peripheral vascular disease) (HCC)   Atrial fibrillation with RVR (HCC)   BPH (benign prostatic hyperplasia)   Longstanding persistent atrial fibrillation (HCC)   Chronic pain   CKD (chronic kidney disease) stage 3, GFR 30-59 ml/min   History of DVT (deep vein thrombosis)   Discharge Medications: Allergies as of 11/05/2016      Reactions   Amiodarone Other (See Comments)   Thyroiditis in the setting of amiodarone use   Penicillins Rash, Other (See Comments)   Has patient had a PCN reaction causing immediate rash, facial/tongue/throat swelling, SOB or lightheadedness with hypotension: Yes Has patient had a PCN reaction causing severe rash involving mucus membranes or skin necrosis: No Has patient had a PCN reaction that required hospitalization No Has patient had a PCN reaction occurring within the last 10 years: No If all of the above answers are "NO", then may proceed with Cephalosporin use.      Medication List    STOP taking these medications   Meclizine HCl 25 MG Chew     TAKE these medications   albuterol 108 (90 Base) MCG/ACT inhaler Commonly known as:  VENTOLIN HFA Inhale 2 puffs into the lungs every 6 (six) hours as needed for  wheezing or shortness of breath.   apixaban 2.5 MG Tabs tablet Commonly known as:  ELIQUIS Take 1 tablet (2.5 mg total) by mouth 2 (two) times daily.   aspirin 81 MG chewable tablet Chew 1 tablet (81 mg total) by mouth daily.   buPROPion 100 MG 12 hr tablet Commonly known as:  WELLBUTRIN SR Take 1 tablet (100 mg total) by mouth 2 (two) times daily.   Digoxin 62.5 MCG Tabs Take 0.0625 mg by mouth daily.   dronabinol 10 MG capsule Commonly known as:  MARINOL Take 1 capsule (10 mg total) by mouth 2 (two) times daily before lunch and supper.   famotidine 20 MG tablet Commonly known as:  PEPCID Take 1 tablet (20 mg total) by mouth daily.   feeding supplement Liqd Take 1 Container by mouth 3 (three) times daily between meals.   metoprolol succinate 50 MG 24 hr tablet Commonly known as:  TOPROL-XL Take 100 mg in the morning and 50 mg in the evening. Take with or immediately following a meal.   midodrine 5 MG tablet Commonly known as:  PROAMATINE Take 1 tablet (5 mg total) by mouth 3 (three) times daily with meals.   mirtazapine 15 MG tablet Commonly known as:  REMERON Take 1 tablet (15 mg total) by mouth at bedtime.   naloxegol oxalate 12.5 MG Tabs tablet Commonly known as:  MOVANTIK Take 1 tablet (12.5 mg total) by mouth daily before breakfast. Start taking on:  11/06/2016   nicotine 7 mg/24hr patch Commonly known as:  NICODERM CQ - dosed in mg/24  hr Place 1 patch (7 mg total) onto the skin at bedtime.   omeprazole 40 MG capsule Commonly known as:  PRILOSEC Take 40 mg by mouth daily.   ondansetron 4 MG tablet Commonly known as:  ZOFRAN Take 1 tablet (4 mg total) by mouth every 8 (eight) hours as needed for nausea or vomiting.   oxyCODONE-acetaminophen 10-325 MG tablet Commonly known as:  PERCOCET Take 1 tablet by mouth every 4 (four) hours as needed for pain. Do not take more than 5 tablets in 24 hours.   polyethylene glycol packet Commonly known as:  MIRALAX /  GLYCOLAX Take 17 g by mouth daily as needed. What changed:  when to take this  reasons to take this   rosuvastatin 20 MG tablet Commonly known as:  CRESTOR TAKE 1 TABLET EVERY DAY AT 6 PM What changed:  See the new instructions.   senna-docusate 8.6-50 MG tablet Commonly known as:  Senokot-S Take 2 tablets by mouth 2 (two) times daily.   tiotropium 18 MCG inhalation capsule Commonly known as:  SPIRIVA Place 1 capsule (18 mcg total) into inhaler and inhale daily.       Disposition and follow-up:   [name] was discharged from [facility] in stable condition.  At the hospital follow up visit please address:  1.  Constipation: Ensure he is COMPLIANT with bowel regimen of Movantic, senna and prn miralax. Evaluate for any adverse effects of Movantic.  Afib: Ensure rate is adequately controlled.  2.  Labs / imaging needed at time of follow-up: none.  3.  Pending labs/ test needing follow-up: none.  Follow-up Appointments: Follow-up Information    [place]. Go on 11/19/2016.   Why:  At 3:45 pm Contact information: [address] [place] [facility] Course by problem list: Principal Problem:   Fecal impaction [facility]) Active Problems:   History of tobacco abuse   Essential hypertension   Cardiac pacemaker in situ   COPD (chronic obstructive pulmonary disease) (HCC)   Chronic anticoagulation   Major depressive disorder, recurrent episode ([place])   Nonischemic cardiomyopathy ([employer])   Chronic combined systolic and diastolic congestive heart failure (HCC)   PVD (peripheral vascular disease) (HCC)   Atrial fibrillation with RVR (HCC)   BPH (benign prostatic hyperplasia)   Longstanding persistent atrial fibrillation (HCC)   Chronic pain   CKD (chronic kidney disease) stage 3, GFR 30-59 ml/min   History of DVT (deep vein thrombosis)   #Fecal impaction #Chronic opioid  use #Opioid-related constipation [name] is a 71 year old male admitted 10/30/16 with complaint of a 2 week history of constipation, progressive abdominal pain, fullness nausea and dry heaving. CT scan with a large fecaloma with stercoral colitis. He was manually disimpacted in the emergency room and received several days of by mouth laxatives and enemas without much success. Narcotics were held during this time. He eventually agreed to starting Movantic and milk and molasses enemas with excellent result. Had several large volume bowel movements in the 2 days leading up to discharge. He was discharged home with Movantic, scheduled senna instructions to take MiraLAX as needed for persistent constipation.  #Dehydration, Acute on Chronic Renal insufficiency. Baseline creatinine 1.7 creatinine max 2.0. This is in the setting of 2 weeks of decreased by mouth intake and persistent nausea and vomiting secondary to fecal impaction. He was gently rehydrated with return of his baseline renal function.  #  Atrophic fibrillation with RVR Rate was in 140s on presentation despite compliance with his home metoprolol. This improved with IV metoprolol and IV fluids. His rate was controlled for the remainder of admission on his home dose of metoprolol 100 mg in the morning and 50 mg at night. Digoxin and apixaban were also continued.  Discharge Vitals:   BP 102/63 (BP Location: Right Arm)   Pulse 60   Temp 98.4 F (36.9 C) (Oral)   Resp 16   Ht 6\' 3"  (1.905 m)   Wt 125 lb 7.1 oz (56.9 kg)   SpO2 97%   BMI 15.68 kg/m   Pertinent Labs, Studies, and Procedures:  CT abdomen pelvis: Large stool ball in rectum with surrounding stercoral colitis and fecal impaction  Discharge Instructions: Discharge Instructions    Call MD for:  difficulty breathing, headache or visual disturbances    Complete by:  As directed    Call MD for:  extreme fatigue    Complete by:  As directed    Call MD for:  persistant dizziness  or light-headedness    Complete by:  As directed    Call MD for:  persistant nausea and vomiting    Complete by:  As directed    Call MD for:  severe uncontrolled pain    Complete by:  As directed    Call MD for:  temperature >100.4    Complete by:  As directed    Diet - low sodium heart healthy    Complete by:  As directed    Discharge instructions    Complete by:  As directed    Monitor volume status, appetite, ability to care for self. Monitor for adequate bowel movements with scheduled Senna and Movantik.   Increase activity slowly    Complete by:  As directed      Signed: Zada Finders, MD 11/05/2016, 2:12 PM

## 2016-11-05 NOTE — Care Management Note (Signed)
Case Management Note  Patient Details  Name: [name] MRN: [id number] Date of Birth: 02-04-1946  Subjective/Objective:      CM following for progression and d/c planning.               Action/Plan: Pt active with [facility] for HHPT, HHRN and [place] aide services added. DME ordered and delivered to room. Pt son working on coverage at home for this pt.   Expected Discharge Date:  11/05/2016            Expected Discharge Plan:  [place]  In-House Referral:  NA  Discharge planning Services  CM Consult  Post Acute Care Choice:  Home Health Choice offered to:  Patient  DME Arranged:   3:1 and tub bench DME Agency:   [facility]  HH Arranged:  RN, PT, Nurse's Aide [place] Agency:  [place]  Status of Service:  Completed.   If discussed at [place] of Stay Meetings, dates discussed:    Additional Comments:  Adron Bene, RN 11/05/2016, 10:39 AM

## 2016-11-05 NOTE — Discharge Summary (Signed)
Medicine attending discharge note: I personally examined this patient on the day of discharge and I attest to the accuracy of the discharge evaluation and plan as recorded by resident physician [name] and his final progress note. One of innumerable admissions for this 71 year old man who usually gets admitted for rate control for refractory persistent atrial arrhythmias.  Most recent admission for this problem was on May 22.  His beta-blocker dose was adjusted and he now takes metoprolol XL 100 mg in the morning and 50 mg in the evening.  Low-dose digoxin 0.0625 mg continued.  He has a chronic pain syndrome for which she takes chronic narcotic analgesics.  He has had problems with severe constipation and intermittent bowel obstruction likely related to this.  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  He was found to have fecal impaction.  A CT scan of the abdomen did not show any evidence for mesenteric ischemia.  A large fecalith was seen in the rectum.  He was treated with disimpaction, aggressive laxatives, enemas, and intravenous hydration.  He was started on a trial Naloxegol (Movantic) on June 18.  It is too early to assess response.  He did start to move his bowels with the laxative and enema program. At time of discharge his abdomen is soft, nontender, not distended, hypoactive bowel sounds. He was kept on a cardiac monitor in view of his history.  Heart rate was well controlled once he was euhydrate.  He has persistent atrial fibrillation.  Renal function remained stable at his baseline with creatinine 1.7-2.0 estimated creatinine clearance 37- 45 mL/min.  Disposition: Condition stable at time of discharge He will continue to follow-up in our General medical clinic There were no complications

## 2016-11-05 NOTE — Progress Notes (Signed)
   Subjective:  Feels well today, tolerating food, bowels moving. Feels ready to go home.  Objective:  Vital signs in last 24 hours: Vitals:   11/04/16 2133 11/05/16 0520 11/05/16 0847 11/05/16 1150  BP: 107/64 96/65 102/63   Pulse: 81 (!) 53 60   Resp: 14 16 16    Temp: 98.6 F (37 C) 97.8 F (36.6 C) 98.4 F (36.9 C)   TempSrc:   Oral   SpO2: 99% 98% 100% 97%  Weight:  125 lb 7.1 oz (56.9 kg)    Height:       General: Cachectic appearing AA male, resting in bed. NAD Cardiac: IRIR. Rate controlled.  Pulm: CTA BL with normal WOB on RA. Abd: Soft, non-tender and not distended.  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 Had several large volume BMs yesterday and his abdominal pain and fullness have improved significantly. Have started a trial of Movantik to offset his constipation related to chronic opioid abuse. -Senokot 2 tabs BID -Miralax daily prn -Movantik 12.5 mg qam  #Atrial fibrillation with RVR W/ hx of difficult to control AFib with RVR. Rates much improved since IVF replacement. Appears well controlled on home dose of Metoprolol succinate 100 mg every morning, 50 mg every evening -Digoxin 0.0625 mg daily -Apixaban 2.5 mg BID  #Orthostatic hypotension -Midodrine 5 mg TID  #Chronic pain -Tramadol Q6h prn  -Holding home Oxy-APAP given constipation  #Failure to thrive -Continue Boost TID, Marinol 10 mg BI -PT/OT  #COPD  -home prn Albuterol, Spiriva QD  #Depression-Bupropion 12hr 100 mg BID  Dispo: Anticipated discharge today.   Zada Finders, MD 11/05/2016, 1:56 PM

## 2016-11-06 ENCOUNTER — Telehealth: Payer: Self-pay

## 2016-11-06 DIAGNOSIS — I5042 Chronic combined systolic (congestive) and diastolic (congestive) heart failure: Secondary | ICD-10-CM | POA: Diagnosis not present

## 2016-11-06 DIAGNOSIS — J449 Chronic obstructive pulmonary disease, unspecified: Secondary | ICD-10-CM | POA: Diagnosis not present

## 2016-11-06 DIAGNOSIS — F319 Bipolar disorder, unspecified: Secondary | ICD-10-CM | POA: Diagnosis not present

## 2016-11-06 DIAGNOSIS — I951 Orthostatic hypotension: Secondary | ICD-10-CM | POA: Diagnosis not present

## 2016-11-06 DIAGNOSIS — I130 Hypertensive heart and chronic kidney disease with heart failure and stage 1 through stage 4 chronic kidney disease, or unspecified chronic kidney disease: Secondary | ICD-10-CM | POA: Diagnosis not present

## 2016-11-06 DIAGNOSIS — R627 Adult failure to thrive: Secondary | ICD-10-CM | POA: Diagnosis not present

## 2016-11-06 DIAGNOSIS — G8929 Other chronic pain: Secondary | ICD-10-CM | POA: Diagnosis not present

## 2016-11-06 DIAGNOSIS — N189 Chronic kidney disease, unspecified: Secondary | ICD-10-CM | POA: Diagnosis not present

## 2016-11-06 DIAGNOSIS — I495 Sick sinus syndrome: Secondary | ICD-10-CM | POA: Diagnosis not present

## 2016-11-06 NOTE — Telephone Encounter (Signed)
Agreed, thank you

## 2016-11-06 NOTE — Telephone Encounter (Signed)
HHN calls for VO PT/OT eval and treat for endurance, balance, mobility, shortness of breath also ADL's VO for HHN for medication management and disease management  2 prn visits, 1x week for 1 week, 2x week for 3 weeks, 3x week for 1 week VO's given, do you agree?

## 2016-11-06 NOTE — Telephone Encounter (Signed)
[name] from [facility] requesting VO. Please call pt back.

## 2016-11-07 ENCOUNTER — Other Ambulatory Visit: Payer: Self-pay | Admitting: *Deleted

## 2016-11-07 ENCOUNTER — Other Ambulatory Visit: Payer: Self-pay

## 2016-11-07 NOTE — Patient Outreach (Signed)
[place] [facility]) Care Management  11/07/2016  KRISHAV MAMONE 1945-06-09 [id number]  Initial outreach attempt for transition of care unsuccessful and RN unable to leave a HIPAA approved voice message on the available contact number. Will continue outreach attempts.  [name], RN Care Management Coordinator [place] Office [phone]

## 2016-11-07 NOTE — Telephone Encounter (Signed)
Requesting to speak with a nurse about meds. Please call back.  

## 2016-11-08 DIAGNOSIS — G8929 Other chronic pain: Secondary | ICD-10-CM | POA: Diagnosis not present

## 2016-11-08 DIAGNOSIS — F319 Bipolar disorder, unspecified: Secondary | ICD-10-CM | POA: Diagnosis not present

## 2016-11-08 DIAGNOSIS — N189 Chronic kidney disease, unspecified: Secondary | ICD-10-CM | POA: Diagnosis not present

## 2016-11-08 DIAGNOSIS — I495 Sick sinus syndrome: Secondary | ICD-10-CM | POA: Diagnosis not present

## 2016-11-08 DIAGNOSIS — I130 Hypertensive heart and chronic kidney disease with heart failure and stage 1 through stage 4 chronic kidney disease, or unspecified chronic kidney disease: Secondary | ICD-10-CM | POA: Diagnosis not present

## 2016-11-08 DIAGNOSIS — I5042 Chronic combined systolic (congestive) and diastolic (congestive) heart failure: Secondary | ICD-10-CM | POA: Diagnosis not present

## 2016-11-08 DIAGNOSIS — J449 Chronic obstructive pulmonary disease, unspecified: Secondary | ICD-10-CM | POA: Diagnosis not present

## 2016-11-08 DIAGNOSIS — R627 Adult failure to thrive: Secondary | ICD-10-CM | POA: Diagnosis not present

## 2016-11-08 DIAGNOSIS — I951 Orthostatic hypotension: Secondary | ICD-10-CM | POA: Diagnosis not present

## 2016-11-08 NOTE — Telephone Encounter (Signed)
I received call from [name] to after hours pager.  Phone call returned but there was no answer.  Advised to call back if urgent but that clinic would re-open on Monday morning.

## 2016-11-11 ENCOUNTER — Other Ambulatory Visit: Payer: Self-pay | Admitting: *Deleted

## 2016-11-11 ENCOUNTER — Telehealth: Payer: Self-pay

## 2016-11-11 DIAGNOSIS — Z79891 Long term (current) use of opiate analgesic: Secondary | ICD-10-CM

## 2016-11-11 MED ORDER — OXYCODONE-ACETAMINOPHEN 10-325 MG PO TABS: 1.0000 | ORAL_TABLET | ORAL | 0 refills | Status: DC | PRN

## 2016-11-11 MED ORDER — DIGOXIN 62.5 MCG PO TABS: 0.0625 mg | ORAL_TABLET | Freq: Every day | ORAL | 0 refills | Status: DC

## 2016-11-11 NOTE — Telephone Encounter (Signed)
Called pt, suddenly went "dead", called back lm for rtc

## 2016-11-11 NOTE — Telephone Encounter (Signed)
Would like Helen to call back.  

## 2016-11-12 ENCOUNTER — Other Ambulatory Visit: Payer: Self-pay | Admitting: *Deleted

## 2016-11-12 DIAGNOSIS — N189 Chronic kidney disease, unspecified: Secondary | ICD-10-CM | POA: Diagnosis not present

## 2016-11-12 DIAGNOSIS — I130 Hypertensive heart and chronic kidney disease with heart failure and stage 1 through stage 4 chronic kidney disease, or unspecified chronic kidney disease: Secondary | ICD-10-CM | POA: Diagnosis not present

## 2016-11-12 DIAGNOSIS — F319 Bipolar disorder, unspecified: Secondary | ICD-10-CM | POA: Diagnosis not present

## 2016-11-12 DIAGNOSIS — G8929 Other chronic pain: Secondary | ICD-10-CM | POA: Diagnosis not present

## 2016-11-12 DIAGNOSIS — I495 Sick sinus syndrome: Secondary | ICD-10-CM | POA: Diagnosis not present

## 2016-11-12 DIAGNOSIS — I951 Orthostatic hypotension: Secondary | ICD-10-CM | POA: Diagnosis not present

## 2016-11-12 DIAGNOSIS — I5042 Chronic combined systolic (congestive) and diastolic (congestive) heart failure: Secondary | ICD-10-CM | POA: Diagnosis not present

## 2016-11-12 DIAGNOSIS — J449 Chronic obstructive pulmonary disease, unspecified: Secondary | ICD-10-CM | POA: Diagnosis not present

## 2016-11-12 DIAGNOSIS — R627 Adult failure to thrive: Secondary | ICD-10-CM | POA: Diagnosis not present

## 2016-11-12 NOTE — Telephone Encounter (Signed)
Ready rx, patient aware.Despina Hidden Cassady6/26/20189:48 AM

## 2016-11-12 NOTE — Patient Outreach (Signed)
[place] [facility]) Care Management  11/12/2016  [name] Oct 25, 1945 [id number]   Voice message received from member regarding delivery of Ensure.  Call placed back to member to follow up on concern as well as current health status.  He report that he is no longer constipated, last BM Saturday. He denies any further dizziness, state he has not taken his blood pressure/heart rate today, but will later.  Report weight today 137 pounds.  He is informed that this care manager does have 2 more cases of Ensure, will perform home visit later this week, will deliver at that time.  He verbalizes understanding, denies any urgent concerns at this time.   [name], [place], MSN [place] [phone]

## 2016-11-13 ENCOUNTER — Telehealth: Payer: Self-pay | Admitting: Internal Medicine

## 2016-11-14 ENCOUNTER — Other Ambulatory Visit: Payer: Self-pay | Admitting: *Deleted

## 2016-11-14 DIAGNOSIS — G8929 Other chronic pain: Secondary | ICD-10-CM | POA: Diagnosis not present

## 2016-11-14 DIAGNOSIS — R627 Adult failure to thrive: Secondary | ICD-10-CM | POA: Diagnosis not present

## 2016-11-14 DIAGNOSIS — I130 Hypertensive heart and chronic kidney disease with heart failure and stage 1 through stage 4 chronic kidney disease, or unspecified chronic kidney disease: Secondary | ICD-10-CM | POA: Diagnosis not present

## 2016-11-14 DIAGNOSIS — F319 Bipolar disorder, unspecified: Secondary | ICD-10-CM | POA: Diagnosis not present

## 2016-11-14 DIAGNOSIS — N189 Chronic kidney disease, unspecified: Secondary | ICD-10-CM | POA: Diagnosis not present

## 2016-11-14 DIAGNOSIS — J449 Chronic obstructive pulmonary disease, unspecified: Secondary | ICD-10-CM | POA: Diagnosis not present

## 2016-11-14 DIAGNOSIS — I5042 Chronic combined systolic (congestive) and diastolic (congestive) heart failure: Secondary | ICD-10-CM | POA: Diagnosis not present

## 2016-11-14 DIAGNOSIS — I951 Orthostatic hypotension: Secondary | ICD-10-CM | POA: Diagnosis not present

## 2016-11-14 DIAGNOSIS — I495 Sick sinus syndrome: Secondary | ICD-10-CM | POA: Diagnosis not present

## 2016-11-14 NOTE — Patient Outreach (Signed)
[place] [facility]) Care Management   11/14/2016  [name] October 27, 1945 [id number]  [name] is an 71 y.o. male  Subjective:   Member alert and oriented x3.  Reports generalized chronic pain, denies shortness of breath or dizziness.  State he has been compliant with medications, has follow up MD appointment next week.  Objective:   Review of Systems  Constitutional: Negative.   HENT: Negative.   Eyes: Negative.   Respiratory: Negative.   Cardiovascular: Negative.   Gastrointestinal: Negative.   Genitourinary: Negative.   Musculoskeletal: Negative.   Skin: Negative.   Neurological: Negative.   Endo/Heme/Allergies: Negative.   Psychiatric/Behavioral: Negative.     Physical Exam  Constitutional: He is oriented to person, place, and time.  Neck: Normal range of motion.  Cardiovascular:  Irregular  Respiratory: Effort normal and breath sounds normal.  GI: Soft. Bowel sounds are normal.  Musculoskeletal: Normal range of motion.  Neurological: He is alert and oriented to person, place, and time.  Skin: Skin is warm and dry.   BP 107/86   Pulse 74   Resp 18   Wt 136 lb (61.7 kg)   SpO2 98%   BMI 17.00 kg/m   Encounter Medications:   Outpatient Encounter Prescriptions as of 11/14/2016  Medication Sig Note  . albuterol (VENTOLIN HFA) 108 (90 BASE) MCG/ACT inhaler Inhale 2 puffs into the lungs every 6 (six) hours as needed for wheezing or shortness of breath.   [name] apixaban (ELIQUIS) 2.5 MG TABS tablet Take 1 tablet (2.5 mg total) by mouth 2 (two) times daily.   [name] aspirin 81 MG chewable tablet Chew 1 tablet (81 mg total) by mouth daily.   [name] buPROPion (WELLBUTRIN SR) 100 MG 12 hr tablet Take 1 tablet (100 mg total) by mouth 2 (two) times daily.   . Digoxin 62.5 MCG TABS Take 0.0625 mg by mouth daily.   [name] dronabinol (MARINOL) 10 MG capsule Take 1 capsule (10 mg total) by mouth 2 (two) times daily before lunch and supper.   . famotidine (PEPCID) 20 MG tablet  Take 1 tablet (20 mg total) by mouth daily.   . feeding supplement (BOOST / RESOURCE BREEZE) LIQD Take 1 Container by mouth 3 (three) times daily between meals. 09/12/2016: Uses both Boost and Ensure when he can afford it  . metoprolol succinate (TOPROL-XL) 50 MG 24 hr tablet Take 100 mg in the morning and 50 mg in the evening. Take with or immediately following a meal.   . midodrine (PROAMATINE) 5 MG tablet Take 1 tablet (5 mg total) by mouth 3 (three) times daily with meals.   . mirtazapine (REMERON) 15 MG tablet TAKE 1 TABLET(15 MG) BY MOUTH AT BEDTIME   . omeprazole (PRILOSEC) 40 MG capsule Take 40 mg by mouth daily.   . ondansetron (ZOFRAN) 4 MG tablet Take 1 tablet (4 mg total) by mouth every 8 (eight) hours as needed for nausea or vomiting.   [name] oxyCODONE-acetaminophen (PERCOCET) 10-325 MG tablet Take 1 tablet by mouth every 4 (four) hours as needed for pain. Do not take more than 5 tablets in 24 hours.   . polyethylene glycol (MIRALAX / GLYCOLAX) packet Take 17 g by mouth daily as needed.   . rosuvastatin (CRESTOR) 20 MG tablet TAKE 1 TABLET EVERY DAY AT 6 PM (Patient taking differently: TAKE 20 MG EVERY DAY AT 6 PM)   . senna-docusate (SENOKOT-S) 8.6-50 MG tablet Take 2 tablets by mouth 2 (two) times daily.   [name]  tiotropium (SPIRIVA) 18 MCG inhalation capsule Place 1 capsule (18 mcg total) into inhaler and inhale daily.   . naloxegol oxalate (MOVANTIK) 12.5 MG TABS tablet Take 1 tablet (12.5 mg total) by mouth daily before breakfast. (Patient not taking: Reported on 11/12/2016)   . nicotine (NICODERM CQ - DOSED IN MG/24 HR) 7 mg/24hr patch Place 1 patch (7 mg total) onto the skin at bedtime. (Patient not taking: Reported on 11/12/2016)    No facility-administered encounter medications on file as of 11/14/2016.     Functional Status:   In your present state of health, do you have any difficulty performing the following activities: 10/31/2016 10/23/2016  Hearing? N N  Vision? N N  Difficulty  concentrating or making decisions? N N  Walking or climbing stairs? Y Y  Dressing or bathing? Y Y  Doing errands, shopping? Y Y  Preparing Food and eating ? - -  Using the Toilet? - -  In the past six months, have you accidently leaked urine? - -  Do you have problems with loss of bowel control? - -  Managing your Medications? - -  Managing your Finances? - -  Housekeeping or managing your Housekeeping? - -  Some recent data might be hidden    Fall/Depression Screening:    Fall Risk  10/23/2016 10/16/2016 09/17/2016  Falls in the past year? Yes Yes Yes  Number falls in past yr: '1 1 1  ' Injury with Fall? No No No  Risk Factor Category  - - -  Risk for fall due to : - History of fall(s);Impaired balance/gait;Medication side effect History of fall(s);Impaired balance/gait  Risk for fall due to (comments): - - -  Follow up - - -   PHQ 2/9 Scores 10/23/2016 10/16/2016 09/17/2016 09/17/2016 09/12/2016 07/17/2016 06/25/2016  PHQ - 2 Score '1 1 1 3 4 2 2  ' PHQ- 9 Score 14 - '8 11 13 7 12  ' Exception Documentation - - - - - - -    Assessment:    Met with member at scheduled time, ensure delivered.  He denies any further constipation, feels better overall.  He is aware that Ensure will no longer be provided and his mobile meals will stop within the next 2-3 weeks.  This care manager inquired about his plan long term.  He state that his daughter provides a cooked meal on Sundays and he has a friend that will cook for him 2-3 times/week if he provides his own food.  He report his daughter will grocery shop for him and he will be able to take the food to his friend.  He is aware that he will continue to have home health, including PT/OT, over the next 3 weeks.  This will provide increase strength in effort to continue to provide self care.  He remains able to transport self to MD appointments.  Long term care plan/resources discussed.  He remains candidate for palliative care and adult day services, but aware that  he is not eligible due to marijuana use.  He state he does not smoke daily, but is not ready to quit completely.  He is aware that services/resources are limited for this reason.  He verbalizes understanding.  Advised that this care manager will continue to follow for transition of care and re-evaluate for further [facility] involvement once transition of care program complete.  If unable to provide further assistance, will close case at that time.  Plan:   Will follow up next week for weekly  transition of care call.  [facility] CM Care Plan Problem One     Most Recent Value  Care Plan Problem One  Risk for readmission related to depression/failure to thrive as evidenced by recent hospitalization  Role Documenting the Problem One  Care Management [place] for Problem One  Active  [facility]N Long Term Goal   Pt will not have any hospitalizations within the next 31 days  [facility] Long Term Goal Start Date  11/05/16 [Not met, readmitted on 5/22, date reset]  Interventions for Problem One Long Term Goal  Discussed with member the importance of following discharge instructions, including follow up appointments, medications, diet, to decrease the risk of readmission  [facility] CM Short Term Goal #1   Member will take all medications as prescribed over the next 4 weeks  [facility] CM Short Term Goal #1 Start Date  11/14/16  Interventions for Short Term Goal #1  Medications reviewed, educated on importance of taking as instructed, particularly ones for depression in effort to increase mood and appetite  [facility] CM Short Term Goal #2   Patient will keep and attend follow up appointment with primary MD within the next week  [facility] CM Short Term Goal #2 Start Date  11/14/16  Interventions for Short Term Goal #2  Discussed with member the importance of follow up with PCP in effort to decrease risk of readmisison.  Transportation confirmed with member.    [facility] CM Short Term Goal #3  Member will report weight gain of at least 10 pounds over  the next 4 weeks  [facility] CM Short Term Goal #3 Start Date  09/26/16  [facility] CM Short Term Goal #3 Met Date  11/14/16  Interventions for Short [place] Goal #3  Assisted member with initiating meal assistance through [place], provided member with 2 cases of Ensure.  Educated on importance of nutrition and consitent weights to show weight gain.     [name], [place], MSN [place] [phone]

## 2016-11-14 NOTE — Telephone Encounter (Signed)
Lm for rtc 

## 2016-11-14 NOTE — Telephone Encounter (Signed)
Asking to speak with a nurse about meds. Please call back.

## 2016-11-15 IMAGING — CR DG CHEST 2V
2 series · 2 of 2 positions shown · non-contrast
Comparison: 08/13/2015

CLINICAL DATA: Shortness of breath.  Pneumonia.

EXAM:
CHEST  2 VIEW

[chest lat]
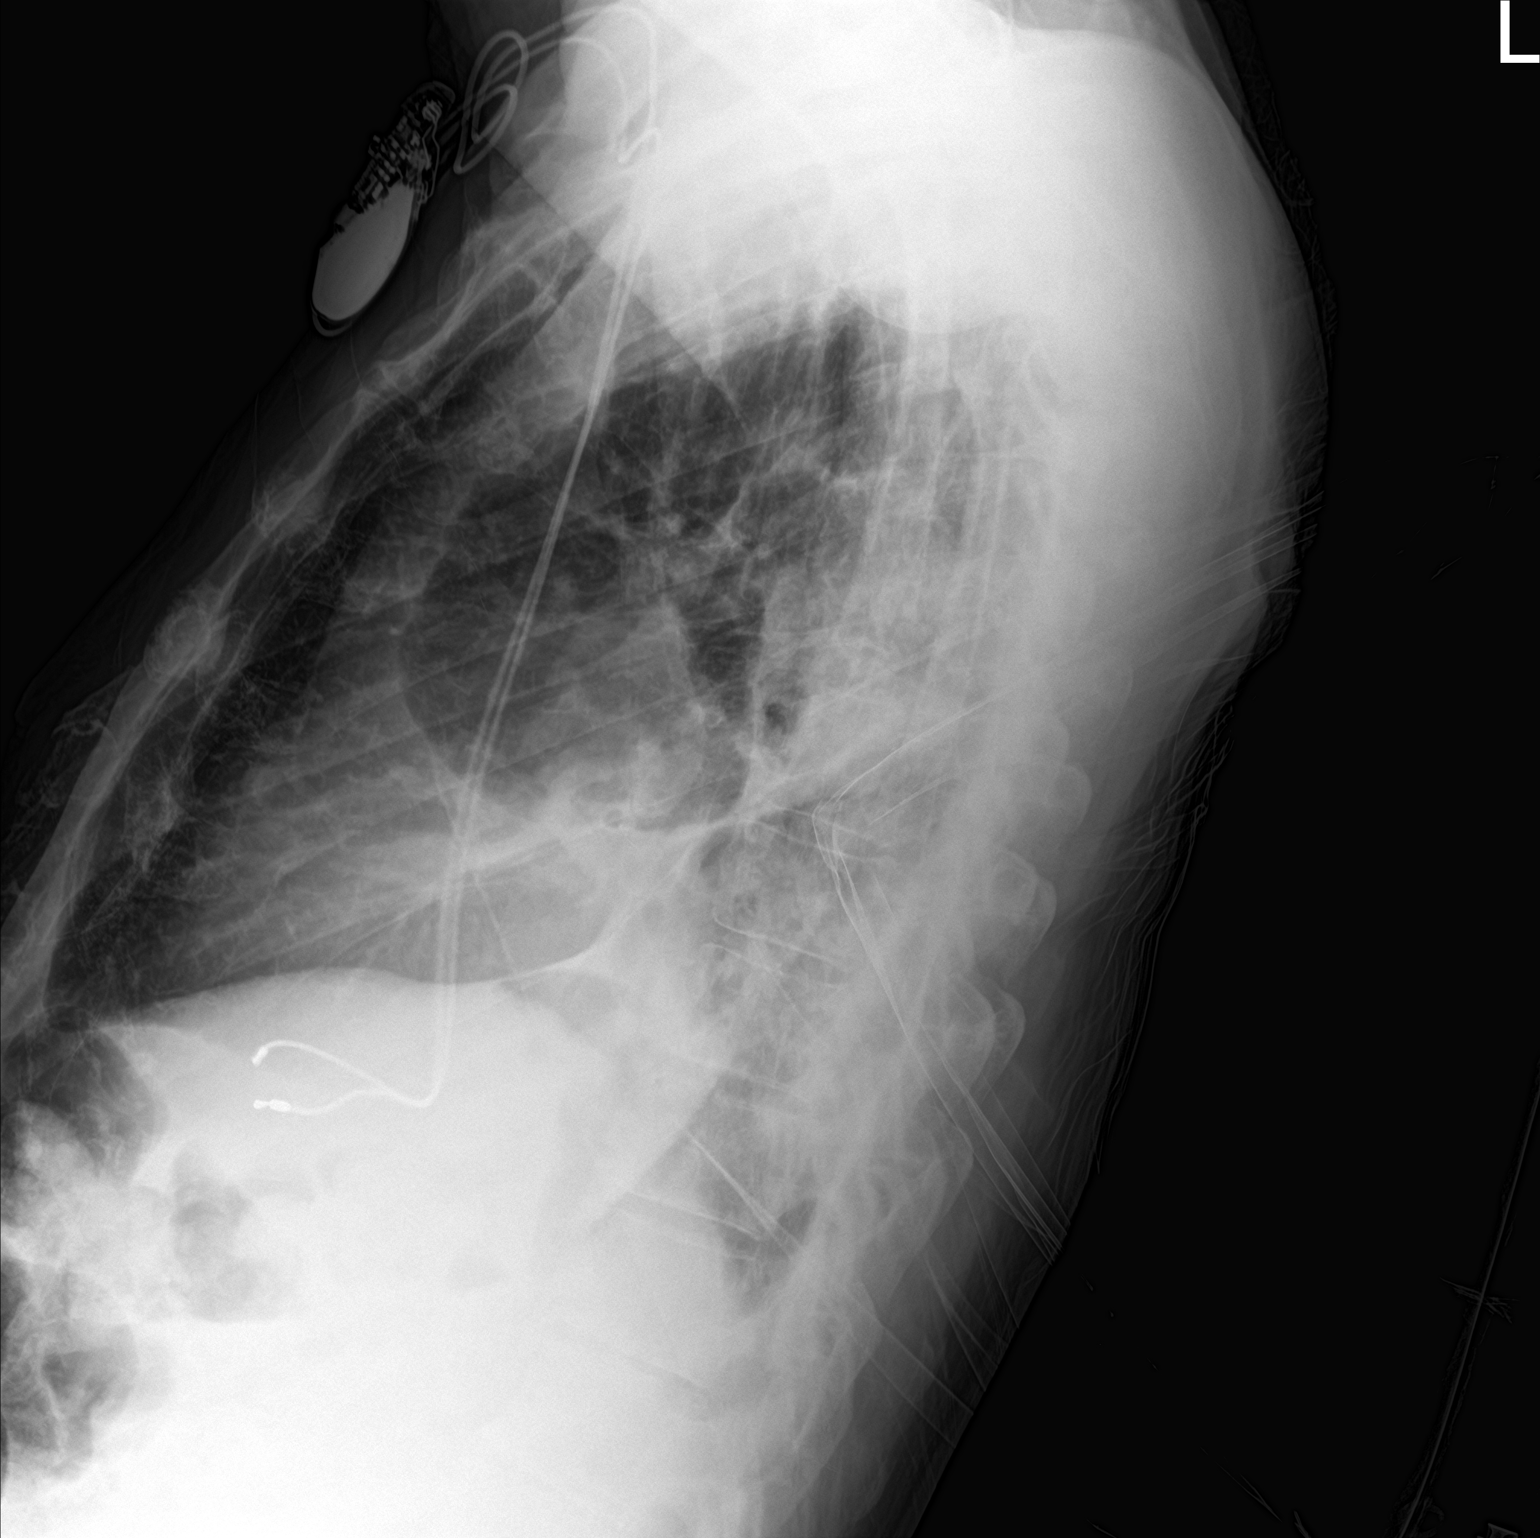

[chest ap]
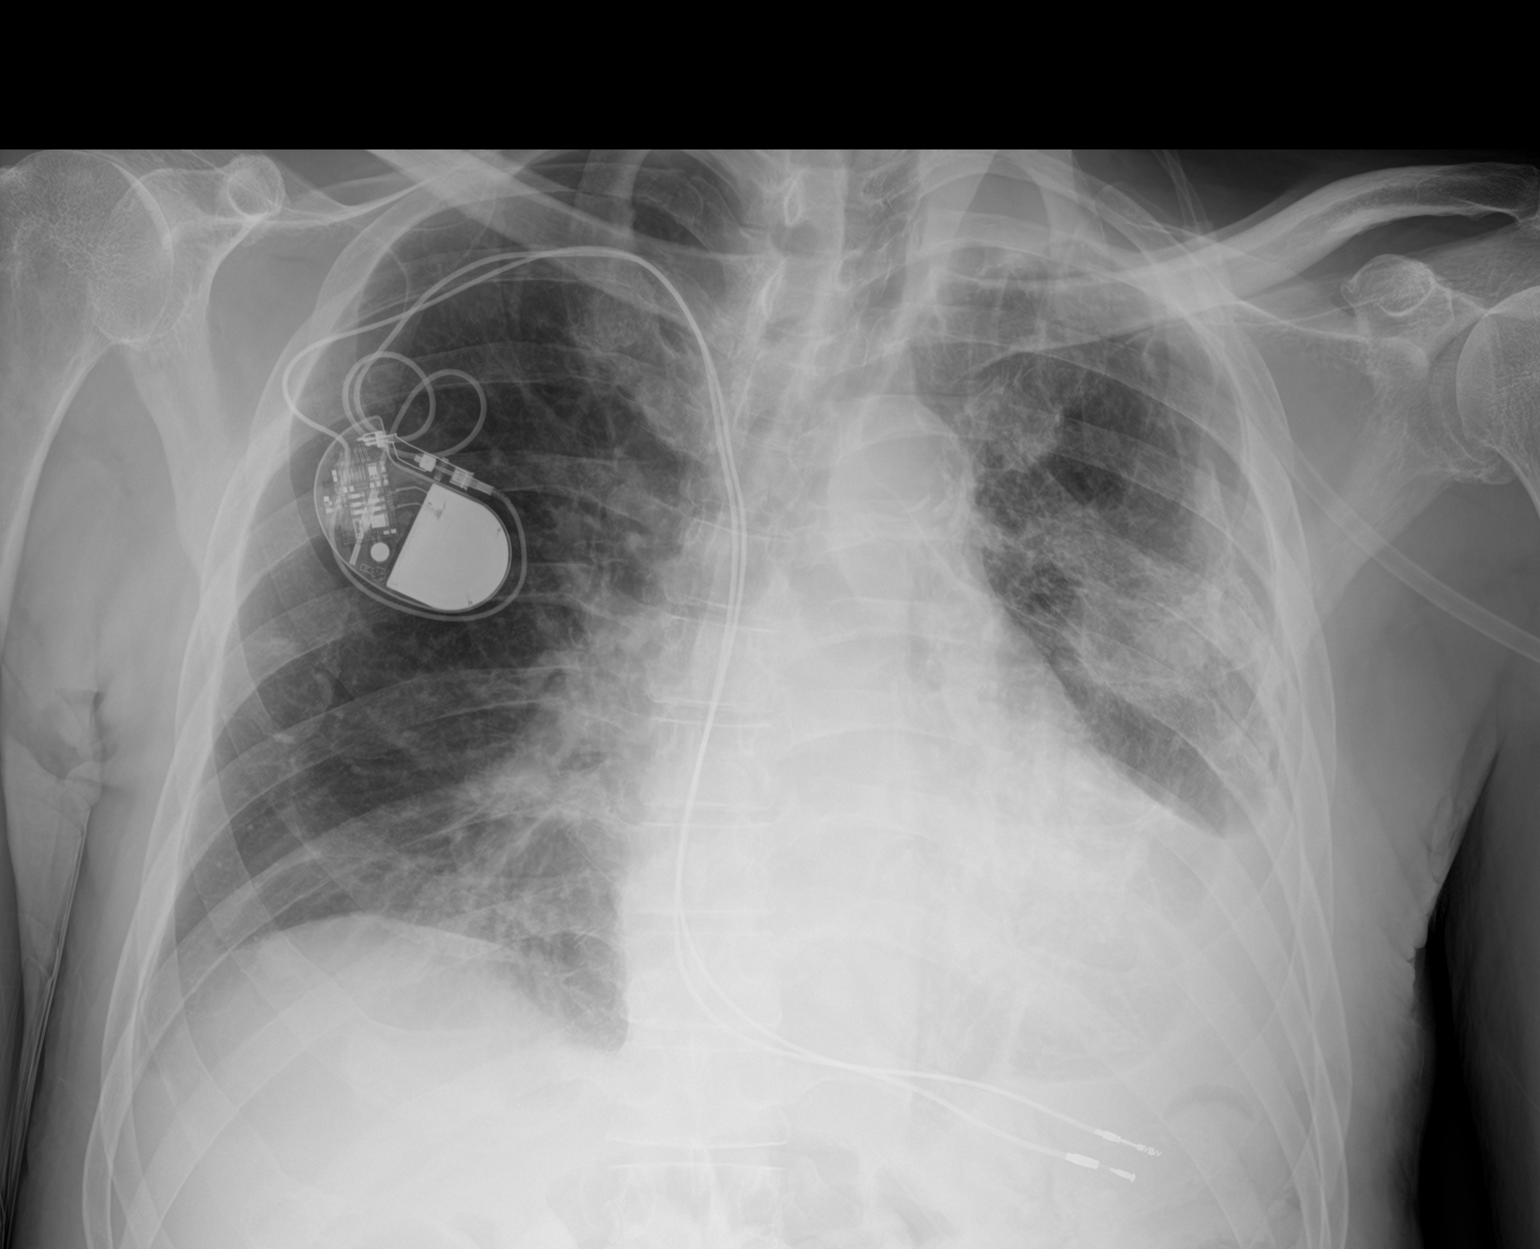

[2 of 2 positions shown; findings below may reference images not displayed]

FINDINGS: Chronic cardiomegaly. Lower lung volumes than when intubated with
medial right basilar opacity. Pleural based scarring on the left.
There is chronic pleural calcification on the left with small left
hemi thorax. Dual-chamber pacer leads from the right. Stable aortic
and hilar contours.
IMPRESSION: 1. Right basilar atelectasis or pneumonia.
2. Chronic cardiomegaly and left fibrothorax.

## 2016-11-15 MED ORDER — DIGOXIN 62.5 MCG PO TABS: 0.0625 mg | ORAL_TABLET | Freq: Every day | ORAL | 0 refills | Status: DC

## 2016-11-15 NOTE — Telephone Encounter (Signed)
Call from pt-digoxin rx not at pharmacy([facility]  Upon further review of pt's chart, rx was sent to [facility] on [address] in error.  [facility]-will inform [facility] to cancel rx.  No further action required, phone call complete.Despina Hidden Cassady6/29/201811:44 AM

## 2016-11-18 ENCOUNTER — Telehealth: Payer: Self-pay | Admitting: *Deleted

## 2016-11-18 ENCOUNTER — Encounter: Payer: Self-pay | Admitting: Pharmacist

## 2016-11-18 DIAGNOSIS — I130 Hypertensive heart and chronic kidney disease with heart failure and stage 1 through stage 4 chronic kidney disease, or unspecified chronic kidney disease: Secondary | ICD-10-CM | POA: Diagnosis not present

## 2016-11-18 DIAGNOSIS — N189 Chronic kidney disease, unspecified: Secondary | ICD-10-CM | POA: Diagnosis not present

## 2016-11-18 DIAGNOSIS — G8929 Other chronic pain: Secondary | ICD-10-CM | POA: Diagnosis not present

## 2016-11-18 DIAGNOSIS — I495 Sick sinus syndrome: Secondary | ICD-10-CM | POA: Diagnosis not present

## 2016-11-18 DIAGNOSIS — I951 Orthostatic hypotension: Secondary | ICD-10-CM | POA: Diagnosis not present

## 2016-11-18 DIAGNOSIS — I5042 Chronic combined systolic (congestive) and diastolic (congestive) heart failure: Secondary | ICD-10-CM | POA: Diagnosis not present

## 2016-11-18 DIAGNOSIS — J449 Chronic obstructive pulmonary disease, unspecified: Secondary | ICD-10-CM | POA: Diagnosis not present

## 2016-11-18 DIAGNOSIS — F319 Bipolar disorder, unspecified: Secondary | ICD-10-CM | POA: Diagnosis not present

## 2016-11-18 DIAGNOSIS — R627 Adult failure to thrive: Secondary | ICD-10-CM | POA: Diagnosis not present

## 2016-11-18 NOTE — Telephone Encounter (Signed)
Cancelled movantik at [facility], [facility]

## 2016-11-18 NOTE — Progress Notes (Signed)
[name] is a 71 y.o. male who was reviewed for monitoring of apixaban (Eliquis) therapy.    ASSESSMENT Indication(s): atrial fibrillation history of thromboembolism, PVD, abdominal aortic atherosclerosis, also on aspirin 81 mg daily Duration: indefinite  Labs: Component Value Date/Time   AST 28 10/30/2016 1753   ALT 19 10/30/2016 1753   NA 138 11/03/2016 0845   NA 147 (H) 07/19/2015 1546   K 3.8 11/03/2016 0845   CL 109 11/03/2016 0845   CO2 21 (L) 11/03/2016 0845   GLUCOSE 105 (H) 11/03/2016 0845   HGBA1C 5.6 10/11/2014 1526   BUN 38 (H) 11/03/2016 0845   BUN 21 07/19/2015 1546   CREATININE 1.73 (H) 11/03/2016 0845   CREATININE 1.33 08/10/2014 1221   CALCIUM 8.5 (L) 11/03/2016 0845   GFRAA 44 (L) 11/03/2016 0845   GFRAA 62 08/10/2014 1221   WBC 13.6 (H) 10/31/2016 0514   HGB 15.2 10/31/2016 0514   HCT 45.1 10/31/2016 0514   PLT 144 (L) 10/31/2016 0514   apixaban (Eliquis) Dose: 2.5 mg BID  No concerns at this time, pharmacy records indicate consistent refills  Flossie Dibble Clinical Pharmacist  11/18/2016, 6:48 PM

## 2016-11-21 ENCOUNTER — Other Ambulatory Visit: Payer: Self-pay | Admitting: *Deleted

## 2016-11-21 ENCOUNTER — Encounter: Payer: Self-pay | Admitting: Internal Medicine

## 2016-11-21 ENCOUNTER — Other Ambulatory Visit: Payer: Self-pay | Admitting: Internal Medicine

## 2016-11-21 DIAGNOSIS — I951 Orthostatic hypotension: Secondary | ICD-10-CM | POA: Diagnosis not present

## 2016-11-21 DIAGNOSIS — N189 Chronic kidney disease, unspecified: Secondary | ICD-10-CM | POA: Diagnosis not present

## 2016-11-21 DIAGNOSIS — I5042 Chronic combined systolic (congestive) and diastolic (congestive) heart failure: Secondary | ICD-10-CM | POA: Diagnosis not present

## 2016-11-21 DIAGNOSIS — I495 Sick sinus syndrome: Secondary | ICD-10-CM | POA: Diagnosis not present

## 2016-11-21 DIAGNOSIS — R110 Nausea: Secondary | ICD-10-CM

## 2016-11-21 DIAGNOSIS — R627 Adult failure to thrive: Secondary | ICD-10-CM | POA: Diagnosis not present

## 2016-11-21 DIAGNOSIS — I130 Hypertensive heart and chronic kidney disease with heart failure and stage 1 through stage 4 chronic kidney disease, or unspecified chronic kidney disease: Secondary | ICD-10-CM | POA: Diagnosis not present

## 2016-11-21 DIAGNOSIS — J449 Chronic obstructive pulmonary disease, unspecified: Secondary | ICD-10-CM | POA: Diagnosis not present

## 2016-11-21 DIAGNOSIS — G8929 Other chronic pain: Secondary | ICD-10-CM | POA: Diagnosis not present

## 2016-11-21 DIAGNOSIS — F319 Bipolar disorder, unspecified: Secondary | ICD-10-CM | POA: Diagnosis not present

## 2016-11-21 NOTE — Patient Outreach (Signed)
[place] [facility]) Care Management  11/21/2016  [name] [birth year] [id number]   Weekly transition of care call placed to member, no answer.  HIPAA compliant voice message left, will await call back.  If no call back, will follow up next week.  [name], [place], MSN [place] [phone]

## 2016-11-25 ENCOUNTER — Telehealth: Payer: Self-pay | Admitting: *Deleted

## 2016-11-25 ENCOUNTER — Other Ambulatory Visit: Payer: Self-pay | Admitting: *Deleted

## 2016-11-25 DIAGNOSIS — N189 Chronic kidney disease, unspecified: Secondary | ICD-10-CM | POA: Diagnosis not present

## 2016-11-25 DIAGNOSIS — I130 Hypertensive heart and chronic kidney disease with heart failure and stage 1 through stage 4 chronic kidney disease, or unspecified chronic kidney disease: Secondary | ICD-10-CM | POA: Diagnosis not present

## 2016-11-25 DIAGNOSIS — I495 Sick sinus syndrome: Secondary | ICD-10-CM | POA: Diagnosis not present

## 2016-11-25 DIAGNOSIS — R627 Adult failure to thrive: Secondary | ICD-10-CM | POA: Diagnosis not present

## 2016-11-25 DIAGNOSIS — I951 Orthostatic hypotension: Secondary | ICD-10-CM | POA: Diagnosis not present

## 2016-11-25 DIAGNOSIS — I5042 Chronic combined systolic (congestive) and diastolic (congestive) heart failure: Secondary | ICD-10-CM | POA: Diagnosis not present

## 2016-11-25 DIAGNOSIS — F319 Bipolar disorder, unspecified: Secondary | ICD-10-CM | POA: Diagnosis not present

## 2016-11-25 DIAGNOSIS — G8929 Other chronic pain: Secondary | ICD-10-CM | POA: Diagnosis not present

## 2016-11-25 DIAGNOSIS — J449 Chronic obstructive pulmonary disease, unspecified: Secondary | ICD-10-CM | POA: Diagnosis not present

## 2016-11-25 NOTE — Patient Outreach (Signed)
[place] [facility]) Care Management  11/25/2016  [name] 12-02-1945 [id number]   Call received from member regarding mobile meals that has been provided by [facility].  He report he was told his last meal was last week.  He is reminded that he had meals provided for May as well as June.  He is also reminded that this care manager informed him that [facility] would no longer be able to provide meals after this current contract and would not be able to deliver any more Ensure.  Long-term plan was discussed during last visit where he would have his daughter do grocery shopping and his friend would prepare meals.  He state his daughter does not get paid until next week.  He expresses concern of being hospitalized last month during the time he was to have meals delivered, questions if we are able to provide meals for the amount of time he missed while in the hospital.  He is advised that this care manager will discuss with assistant clinical manager and contact him for update.  He verbalizes understanding.  [name], [place], MSN [place] [phone]

## 2016-11-25 NOTE — Telephone Encounter (Signed)
[facility]

## 2016-11-26 ENCOUNTER — Encounter: Payer: Self-pay | Admitting: Internal Medicine

## 2016-11-26 ENCOUNTER — Ambulatory Visit: Payer: Medicare HMO

## 2016-11-28 ENCOUNTER — Other Ambulatory Visit: Payer: Self-pay | Admitting: *Deleted

## 2016-11-28 NOTE — Patient Outreach (Addendum)
[place] [facility]) Care Management  11/28/2016  [name] 12/28/1945 [id number]   Call placed to member for transition of care and to follow up on concern regarding meals.  No answer, HIPAA compliant voice message left, will await call back.  [facility] will no longer provide meals.  Will notify member upon return call.  If no call back, will follow up next week.    Update @ 1600:  Call received back from member.  He report that he is doing "ok" today.  State his blood pressure was low yesterday and today (94/70), report that he has not taken his Metoprolol for 2 days.  State his heart rate has been in the 80s, complains of some intermittent dizziness, denies it being as bad as previously when he wa hospitalized.  Report weight at 137 pounds.  He missed appointment with primary MD last week, has not called to reschedule.  He is advised to contact office to reschedule as soon as possible, he verbalizes understanding.  Member made aware that [facility] is unable to extend contract for meals as he has been provided meals for 2 months and has a stated plan per our last discussion.  He was advised to contact [employer] directly to inquire about his place on the waiting list.  He state he will, "I was waiting to see what you found out about yall doing more meals."  He is reminded again of his own stated plan to have his daughter help provide food and his friend to cook.  He state "I guess I don't have any other choice now."    He denies any urgent concerns, advised again to contact primary MD office for appointment.  Will follow up next week.    [name], [place], MSN [place] [phone]

## 2016-12-05 ENCOUNTER — Other Ambulatory Visit: Payer: Self-pay | Admitting: *Deleted

## 2016-12-05 NOTE — Patient Outreach (Signed)
[place] [facility]) Care Management  12/05/2016  NEVAEH CASILLAS January 29, 1946 [id number]   Weekly transition of care call placed to member, no answer.  HIPAA compliant voice message left, will await call back.  Plan to discuss ongoing health concerns/needs if applicable and need for visit with primary MD as he has had 2 no shows this month.  If no call back, will follow up next week.  [name], [place], MSN [place] [phone]

## 2016-12-10 ENCOUNTER — Other Ambulatory Visit: Payer: Self-pay | Admitting: *Deleted

## 2016-12-10 ENCOUNTER — Telehealth: Payer: Self-pay

## 2016-12-10 DIAGNOSIS — Z79891 Long term (current) use of opiate analgesic: Secondary | ICD-10-CM

## 2016-12-10 MED ORDER — OXYCODONE-ACETAMINOPHEN 10-325 MG PO TABS: 1.0000 | ORAL_TABLET | ORAL | 0 refills | Status: DC | PRN

## 2016-12-10 NOTE — Telephone Encounter (Signed)
Spoke w/ him, refill, done

## 2016-12-10 NOTE — Telephone Encounter (Signed)
Asking to speak with [name]. Please call back.  

## 2016-12-10 NOTE — Telephone Encounter (Signed)
Can you make him an appointment in 1-2 months with [name].

## 2016-12-11 ENCOUNTER — Other Ambulatory Visit: Payer: Self-pay | Admitting: *Deleted

## 2016-12-11 DIAGNOSIS — G8929 Other chronic pain: Secondary | ICD-10-CM | POA: Diagnosis not present

## 2016-12-11 DIAGNOSIS — I5042 Chronic combined systolic (congestive) and diastolic (congestive) heart failure: Secondary | ICD-10-CM | POA: Diagnosis not present

## 2016-12-11 DIAGNOSIS — I951 Orthostatic hypotension: Secondary | ICD-10-CM | POA: Diagnosis not present

## 2016-12-11 DIAGNOSIS — F319 Bipolar disorder, unspecified: Secondary | ICD-10-CM | POA: Diagnosis not present

## 2016-12-11 DIAGNOSIS — R627 Adult failure to thrive: Secondary | ICD-10-CM | POA: Diagnosis not present

## 2016-12-11 DIAGNOSIS — N189 Chronic kidney disease, unspecified: Secondary | ICD-10-CM | POA: Diagnosis not present

## 2016-12-11 DIAGNOSIS — I130 Hypertensive heart and chronic kidney disease with heart failure and stage 1 through stage 4 chronic kidney disease, or unspecified chronic kidney disease: Secondary | ICD-10-CM | POA: Diagnosis not present

## 2016-12-11 DIAGNOSIS — J449 Chronic obstructive pulmonary disease, unspecified: Secondary | ICD-10-CM | POA: Diagnosis not present

## 2016-12-11 DIAGNOSIS — I495 Sick sinus syndrome: Secondary | ICD-10-CM | POA: Diagnosis not present

## 2016-12-11 IMAGING — CT CT HEAD W/O CM
2 series · 15 of 30 positions shown, 17 images · non-contrast
Comparison: October 11, 2014

CLINICAL DATA: Altered mental status

EXAM:
CT HEAD WITHOUT CONTRAST
TECHNIQUE: Contiguous axial images were obtained from the base of the skull
through the vertex without intravenous contrast.

[Series 2: head without · axial · non-contrast · 0.48mm/px · z∈[-132,-12]mm · 7 of 32 slices shown, 9 images]
[im 4/32  brain]
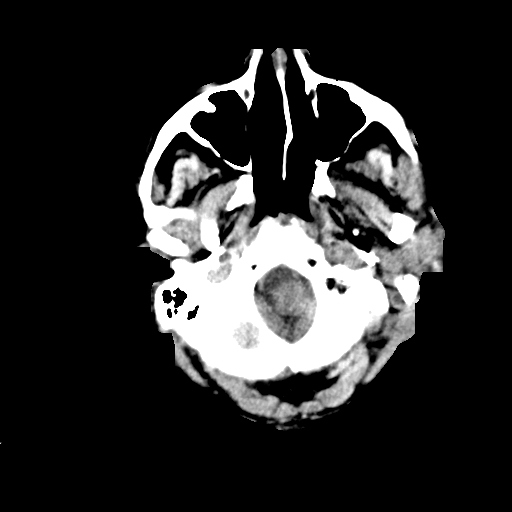
[im 4/32  bone]
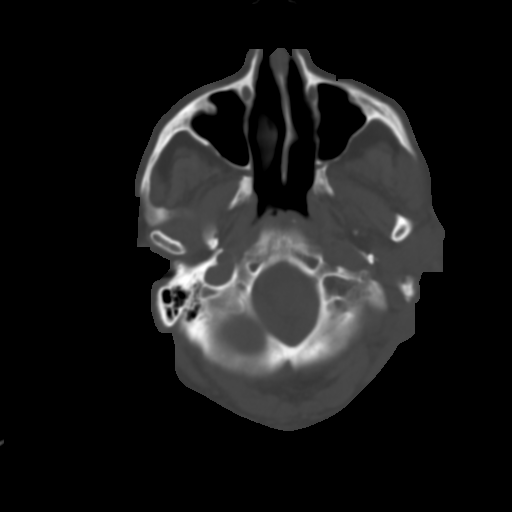
[im 8/32  brain]
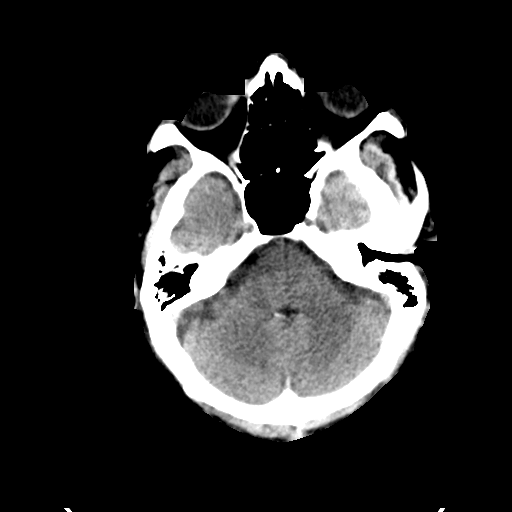
[im 12/32  brain]
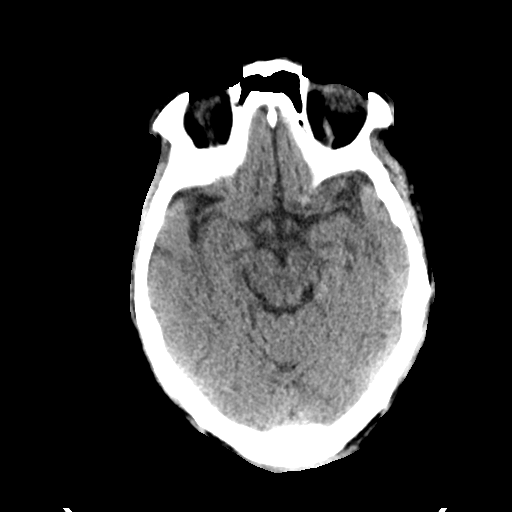
[im 16/32  brain]
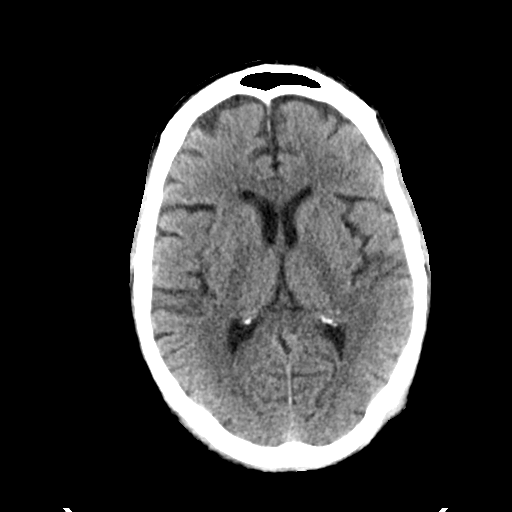
[im 20/32  brain]
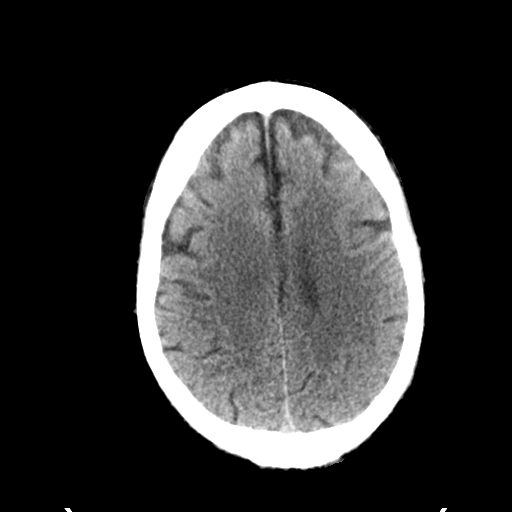
[im 20/32  bone]
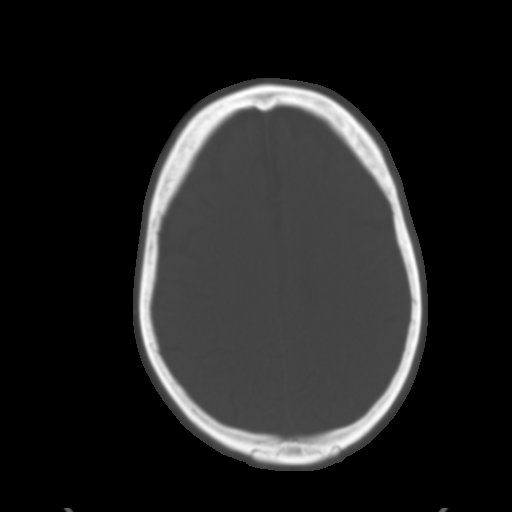
[im 24/32  brain]
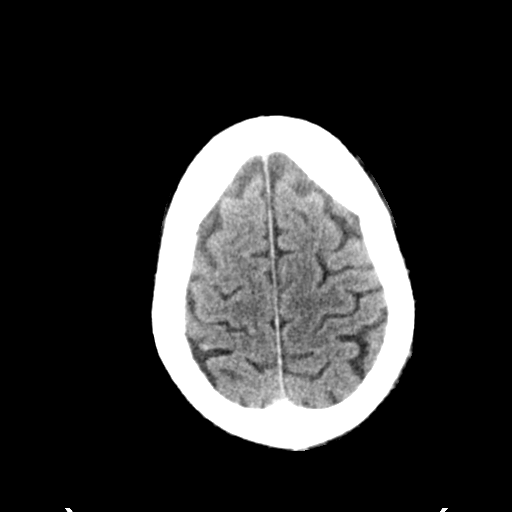
[im 28/32  brain]
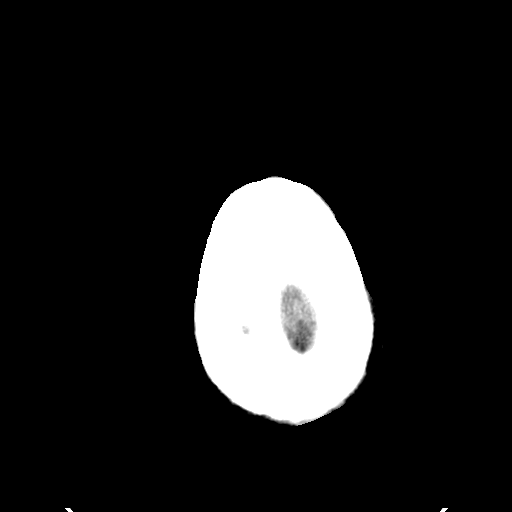

[Series 3: head bone · axial · 0.48mm/px · z∈[-133,-5]mm · 8 of 80 slices shown]
[im 8/80  bone]
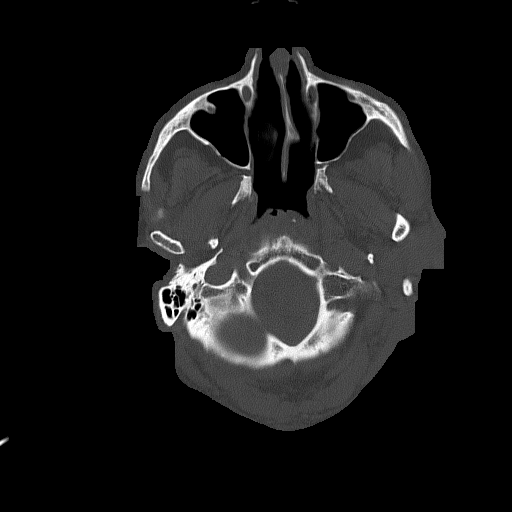
[im 16/80  bone]
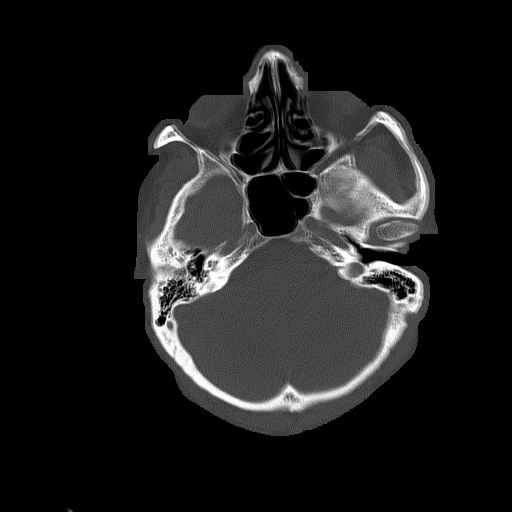
[im 24/80  bone]
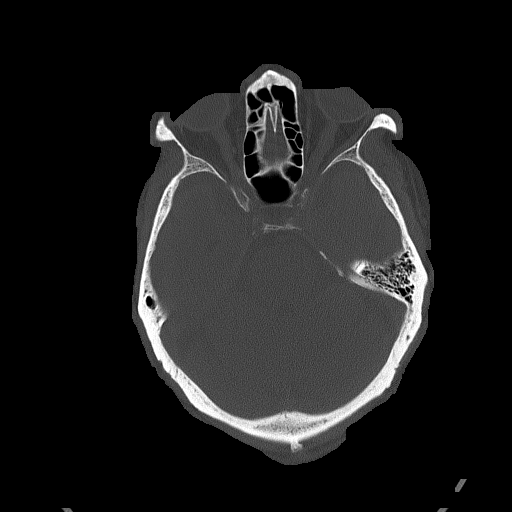
[im 36/80  bone]
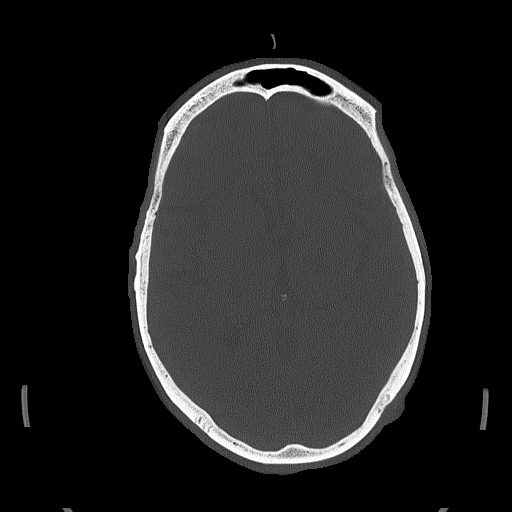
[im 44/80  bone]
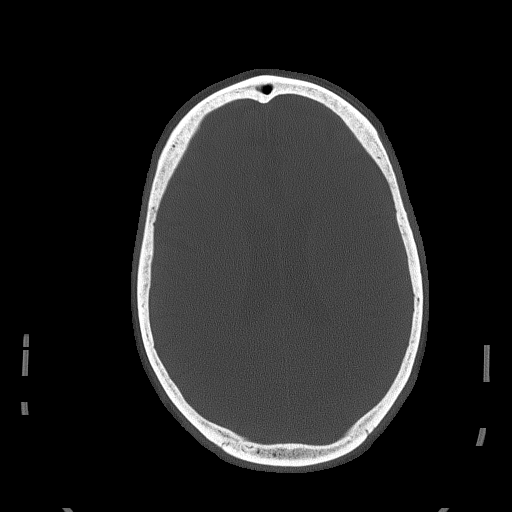
[im 56/80  bone]
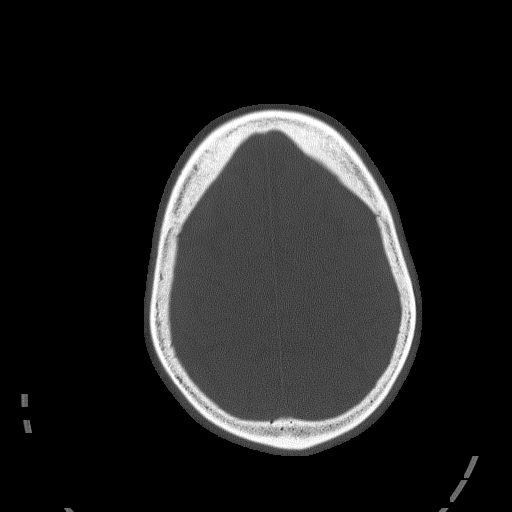
[im 64/80  bone]
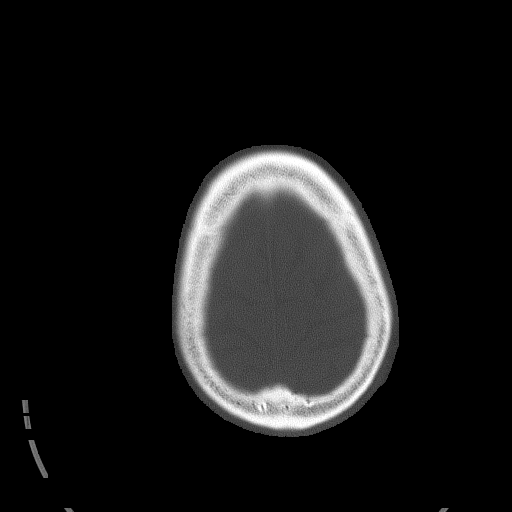
[im 72/80  bone]
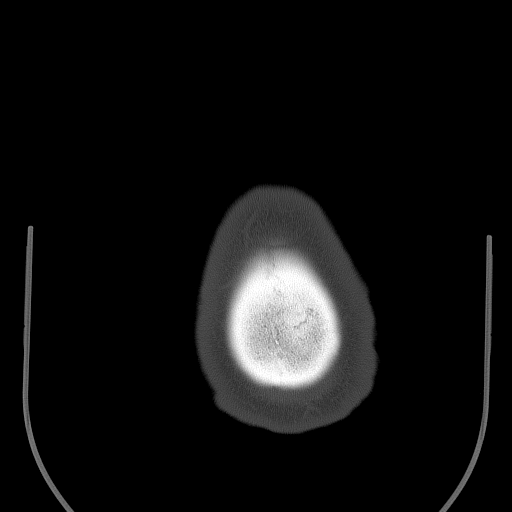

[15 of 30 positions shown; findings below may reference images not displayed]

FINDINGS: There is no midline shift, hydrocephalus, or mass. No acute
hemorrhage or acute transcortical infarct is identified. There is
chronic diffuse atrophy. Chronic bilateral periventricular white
matter small vessel ischemic changes noted. The bony calvarium is
intact. The visualized sinuses are clear.
IMPRESSION: No focal acute intracranial abnormality identified.

Chronic diffuse atrophy.

## 2016-12-11 IMAGING — CR DG CHEST 1V PORT
1 series · 1 of 1 positions shown · non-contrast
Comparison: 08/24/2015 and earlier.

CLINICAL DATA: 69-year-old male with altered mental status
discovered at 3511 hours today. Initial encounter.

EXAM:
PORTABLE CHEST 1 VIEW

[AP]
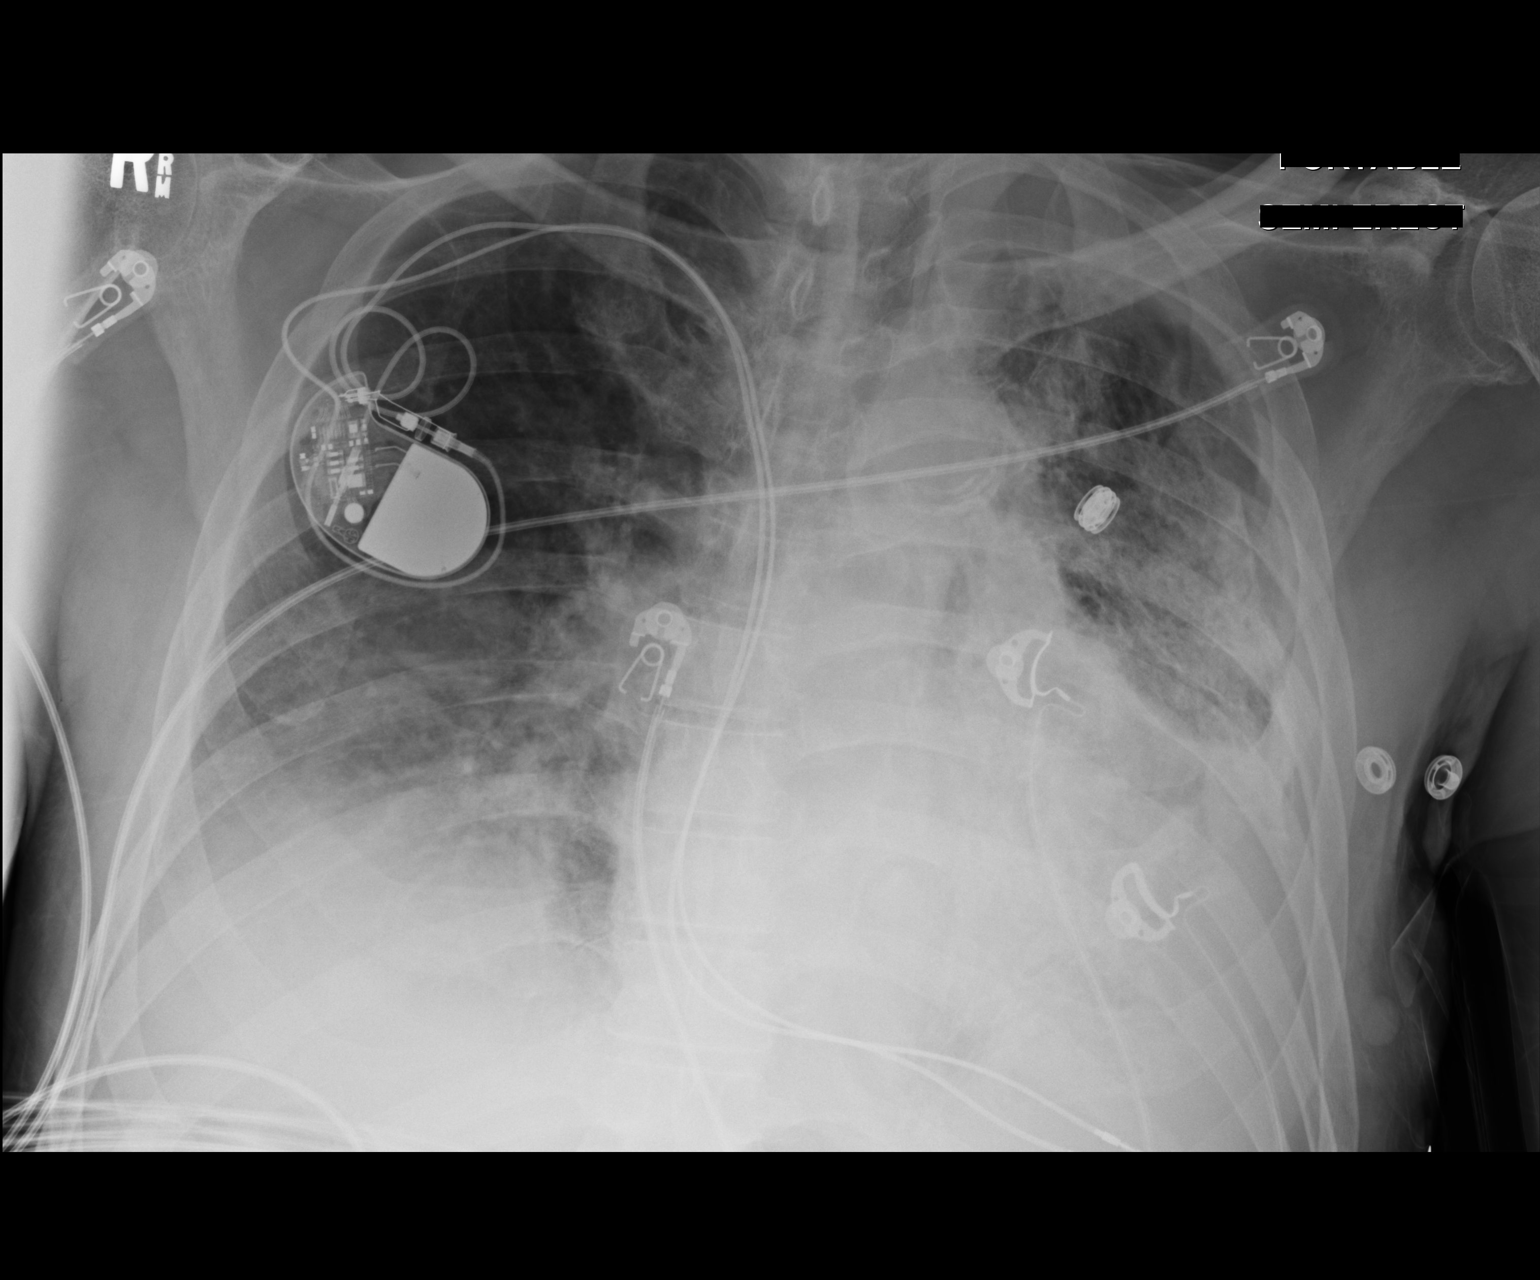

[1 of 1 positions shown; findings below may reference images not displayed]

FINDINGS: Portable AP semi upright view at 6142 hours. Unresolved Patchy and
confluent left upper lobe and lung base opacity compared to [place].
Progressed and now all confluent right lung base opacity obscuring
the diaphragm. Superimposed small left pleural effusion suspected.
Left fibrothorax demonstrated on chest CT 10/13/2014. Stable
cardiomegaly and mediastinal contours. Stable right chest cardiac
pacemaker. No superimposed pneumothorax or pulmonary edema.

Calcified aortic atherosclerosis.
IMPRESSION: 1. Progressed bilateral patchy and confluent airspace opacity since
[place] most suggestive of bilateral pneumonia. Consider aspiration in
this setting.
2. Chronic left fibrothorax with suspected small left pleural
effusion.

## 2016-12-11 NOTE — Addendum Note (Signed)
Addended by: Truddie Crumble on: 12/11/2016 02:44 PM   Modules accepted: Orders

## 2016-12-11 NOTE — Patient Outreach (Signed)
[facility]) Care Management  12/11/2016  [name] [birth year] [id number]   Weekly transition of care call placed to member, no answer.  HIPAA compliant voice message left, will await call back.  This makes 3rd consecutive unsuccessful attempt to contact member.  Will send outreach letter, if no response in 10 business days, will close case.  [name], [place], MSN [place] [phone]

## 2016-12-12 IMAGING — CR DG CHEST 1V PORT
1 series · 1 of 1 positions shown · non-contrast
Comparison: Prior radiograph from 09/19/2015.

CLINICAL DATA: Initial evaluation for acute respiratory failure.

EXAM:
PORTABLE CHEST 1 VIEW

[AP]
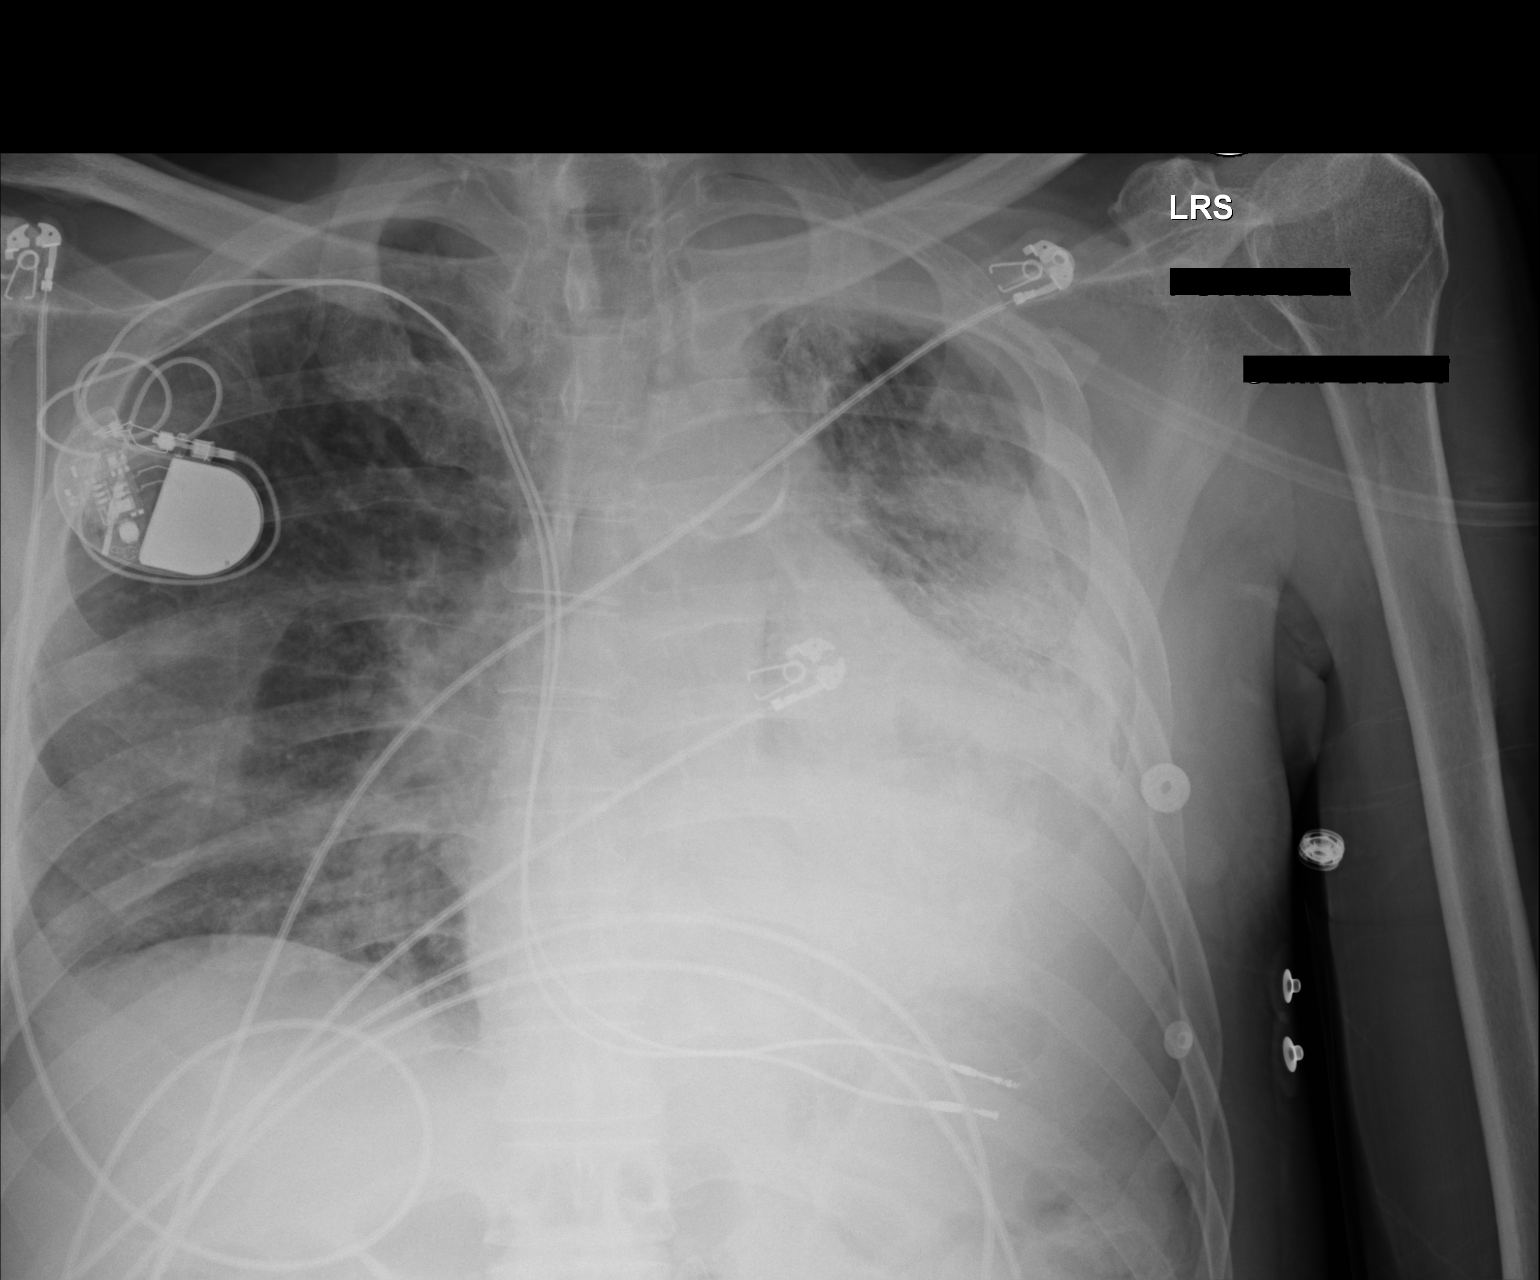

[1 of 1 positions shown; findings below may reference images not displayed]

FINDINGS: Cardiomegaly unchanged. Mediastinal silhouette within normal limits.
Atheromatous plaque within the aortic arch. Right-sided pacemaker/
AICD again noted.

Similar low lung volumes. Probable left pleural effusion. Persistent
patchy and confluent left upper and lower lobe opacity, slightly
progressed from prior. Improved aeration of the right lung base with
improved right basilar opacity. Similar left fibrothorax. No
pneumothorax identified. Mild vascular congestion without pulmonary
edema.

Osseous structures unchanged.
IMPRESSION: 1. Slight interval worsening and patchy and confluent opacity
involving the left upper and lower lobes as compared to prior
radiograph from 09/19/2015.
[DATE]. Improved aeration of the right lung base with improved right
basilar opacity.
3. Similar left fibrothorax. Persistent suspected small left pleural
effusion.

## 2016-12-13 IMAGING — DX DG CHEST 2V
2 series · 2 of 2 positions shown · non-contrast
Comparison: 09/21/2015

CLINICAL DATA: Weakness, pneumonia

EXAM:
CHEST  2 VIEW

[w chest lat]
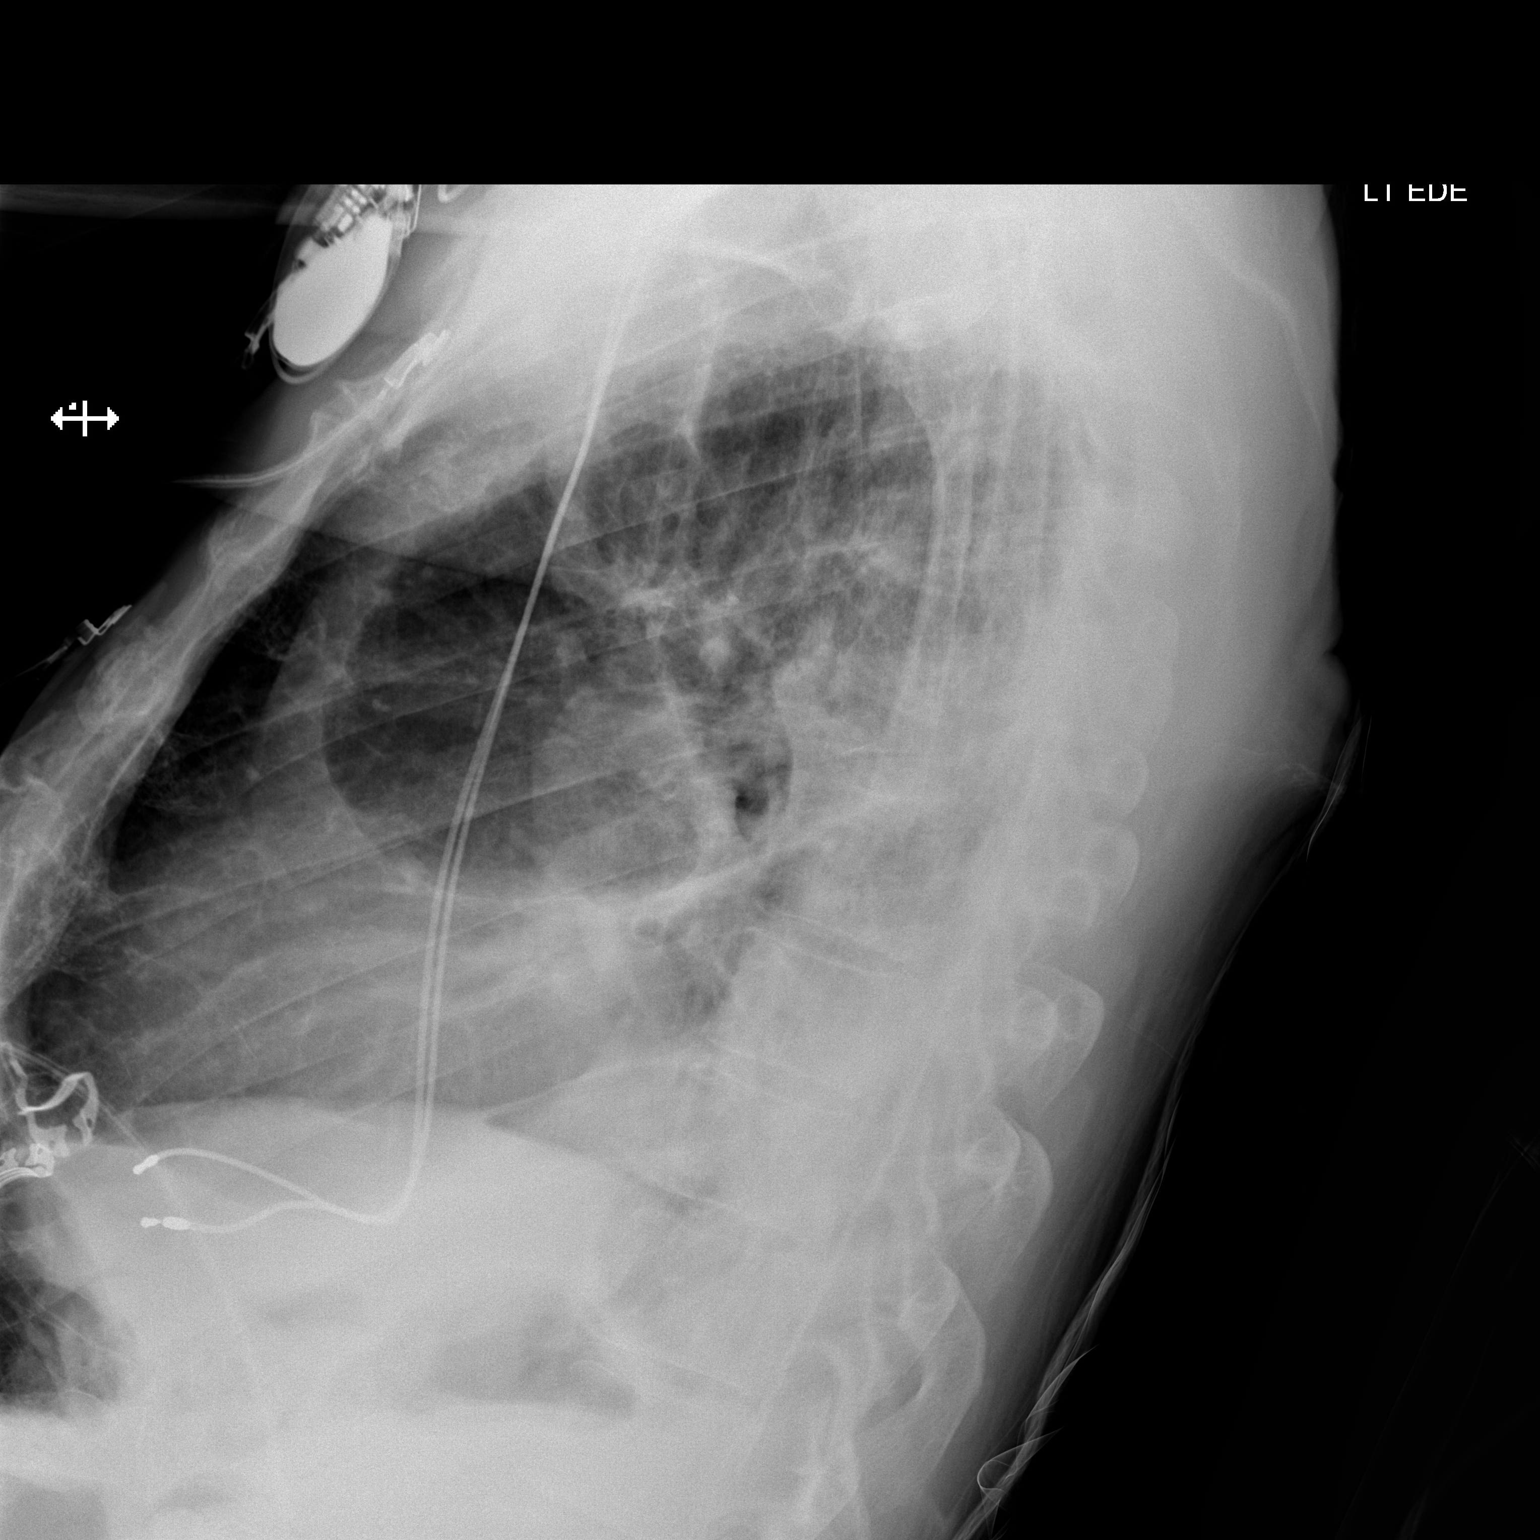

[x chest ap]
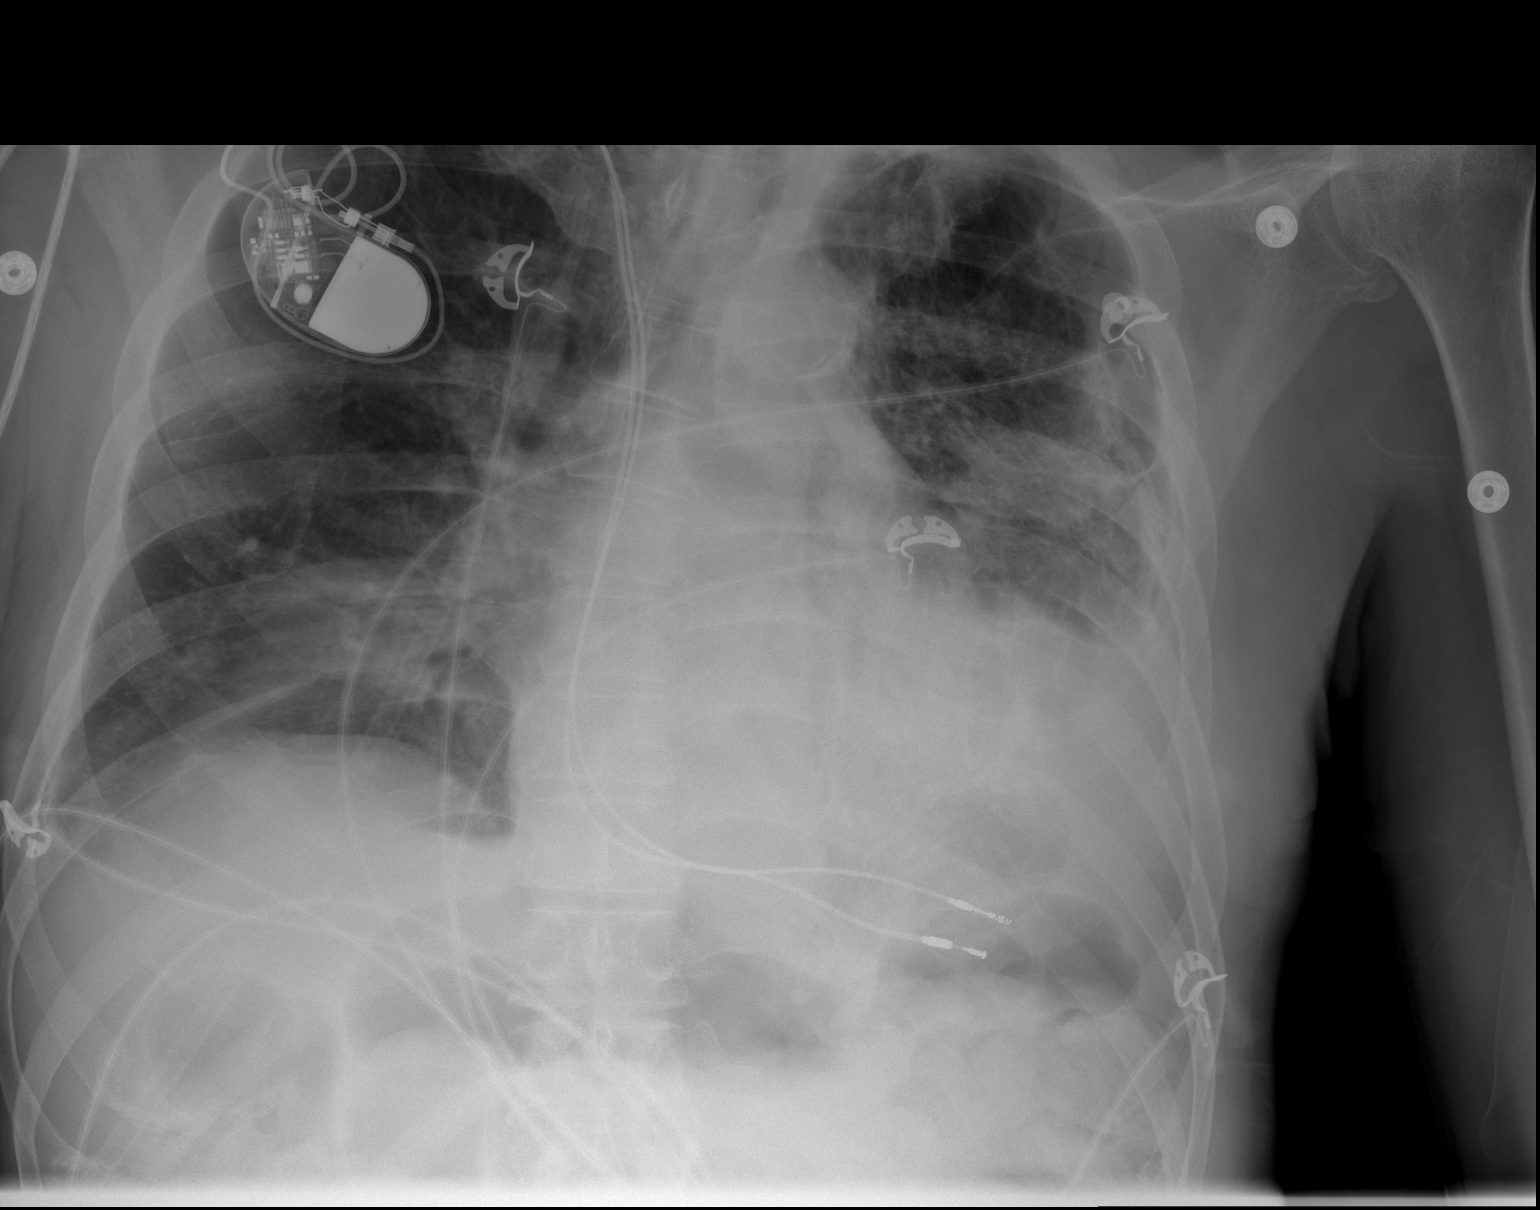

[2 of 2 positions shown; findings below may reference images not displayed]

FINDINGS: Two lead cardiac pacer stable.  Mild cardiac enlargement unchanged.

Increased opacity in the medial right lower lobe stable. Patchy
opacity left mid lung and left lower lobe stable. A small pleural
fluid component is suspected.
IMPRESSION: Suspect small pleural effusions with bilateral multifocal patchy
airspace disease again concerning for pneumonia.

## 2016-12-19 IMAGING — DX DG CHEST 2V
2 series · 2 of 2 positions shown · non-contrast
Comparison: 09/21/2015

CLINICAL DATA: Shortness of Breath

EXAM:
CHEST  2 VIEW

[x chest ap]
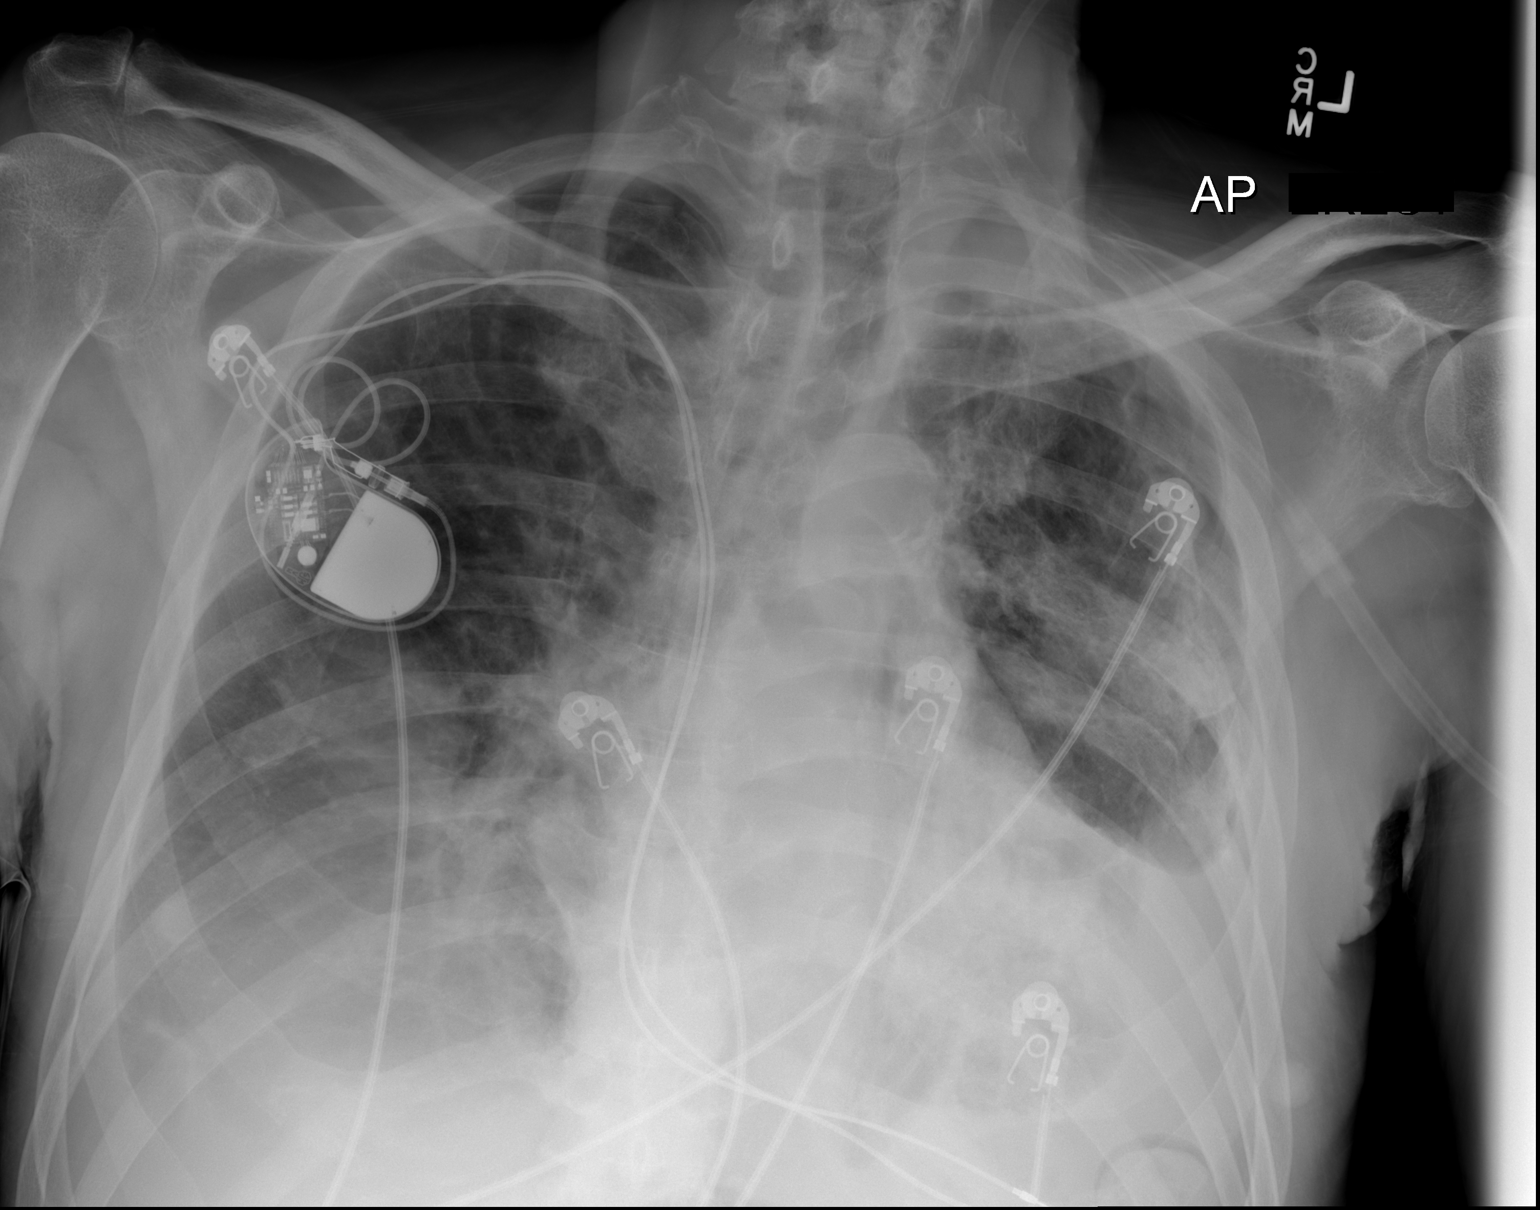

[w chest lat]
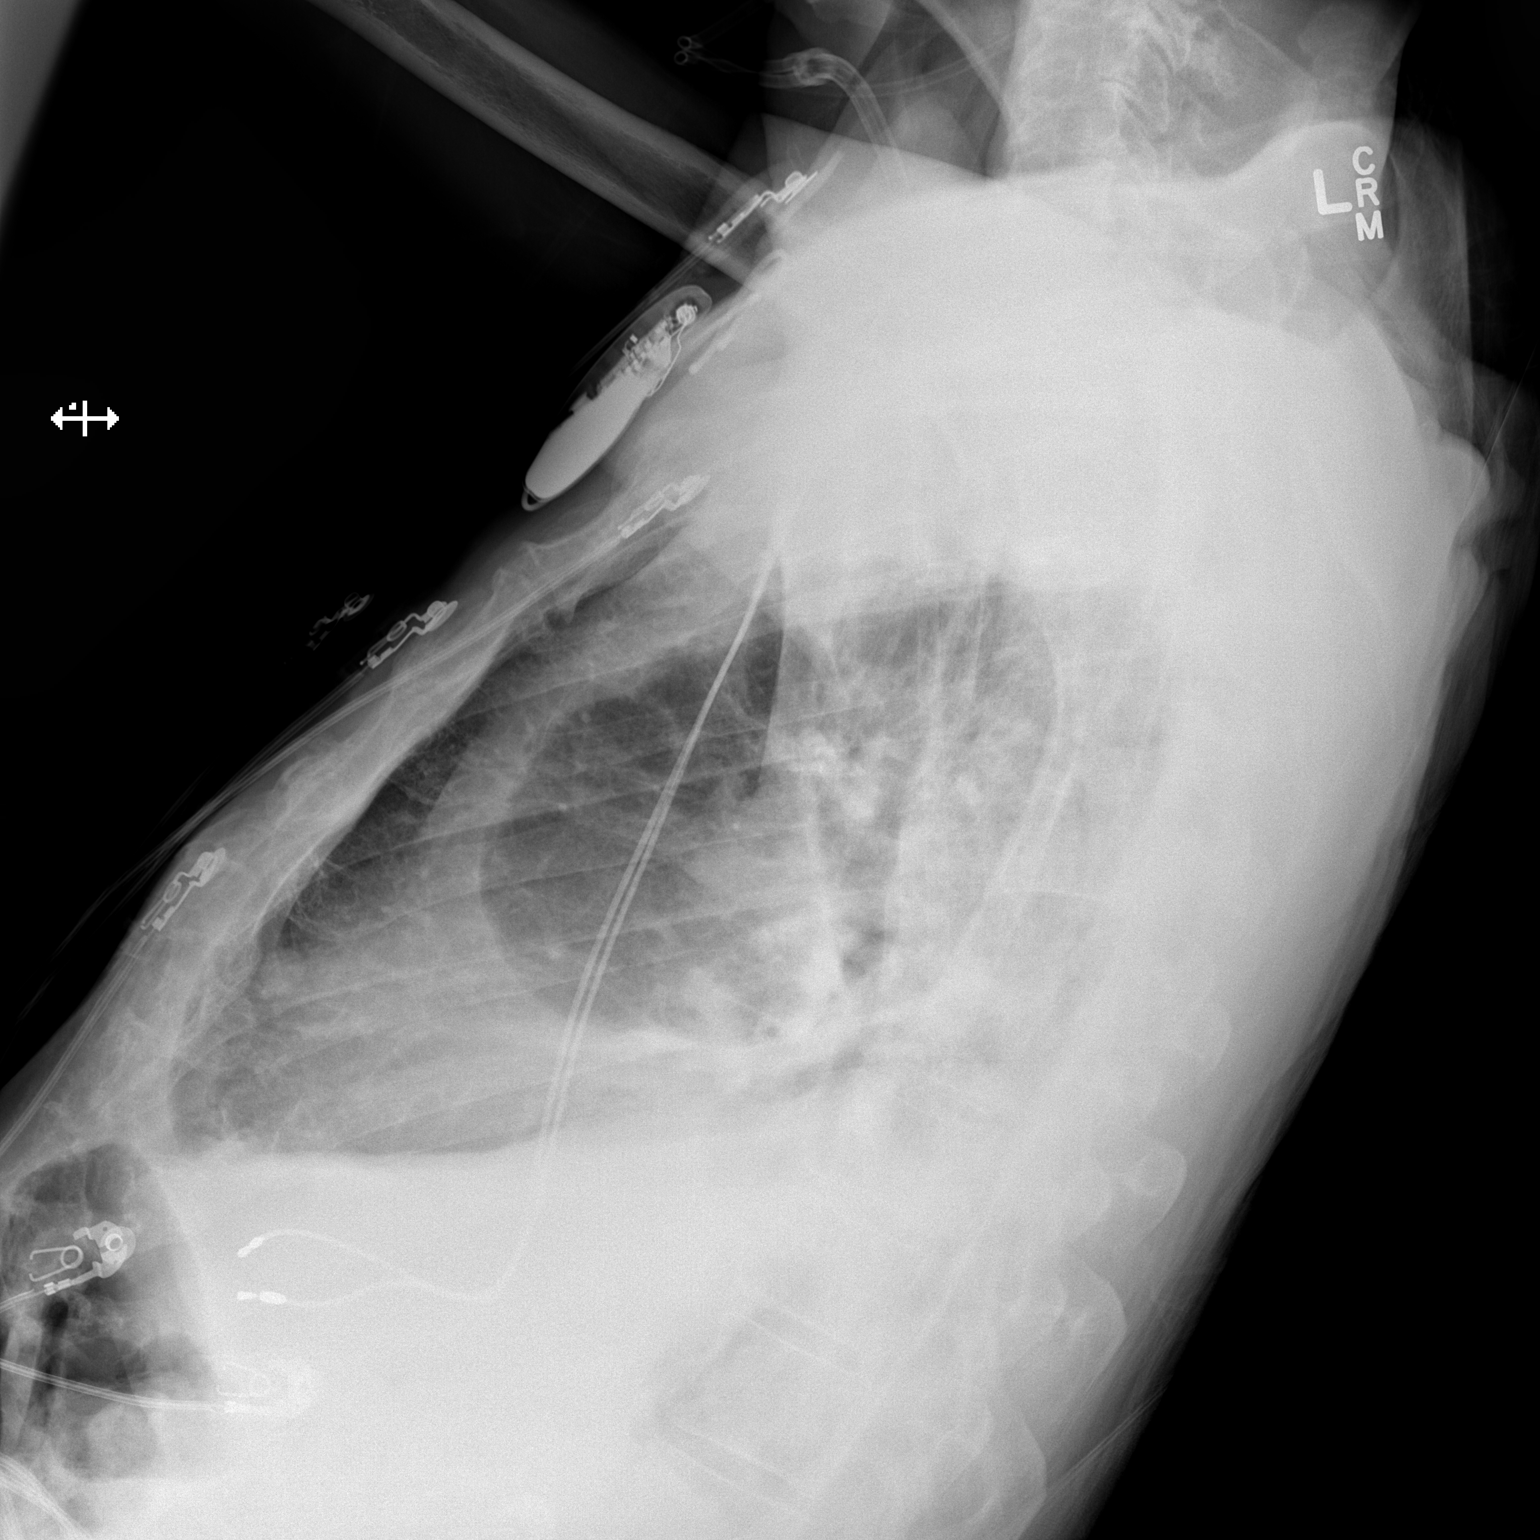

[2 of 2 positions shown; findings below may reference images not displayed]

FINDINGS: Cardiomediastinal silhouette is stable. Dual lead cardiac pacemaker
is unchanged in position. There is interval small to moderate right
pleural effusion with right basilar atelectasis or infiltrate.
Stable left pleural effusion with left basilar atelectasis. Stable
patchy airspace disease left perihilar and left upper lobe.
Persistent patchy airspace is right perihilar probable multifocal
pneumonia.
IMPRESSION: There is interval small to moderate right pleural effusion with
right basilar atelectasis or infiltrate. Stable left pleural
effusion with left basilar atelectasis. Stable patchy airspace
disease left perihilar and left upper lobe. Persistent patchy
airspace is right perihilar probable multifocal pneumonia.

## 2016-12-19 IMAGING — CT CT CHEST W/ CM
2 of 6 series · 13 of 36 positions shown, 16 images · IV contrast (Omni 300)
Comparison: 06/02/2013 chest CT. Chest radiograph from earlier
today.

CLINICAL DATA: Dyspnea.

EXAM:
CT CHEST WITH CONTRAST
TECHNIQUE: Multidetector CT imaging of the chest was performed during
intravenous contrast administration.
CONTRAST:  1 UYIFC4-822 IOPAMIDOL (UYIFC4-822) INJECTION 61%

[Series 4: chest with 2mm st · axial · 0.73mm/px · z∈[-24,+254]mm · 12 of 153 slices shown, 15 images]
[im 7/153  mediastinal]
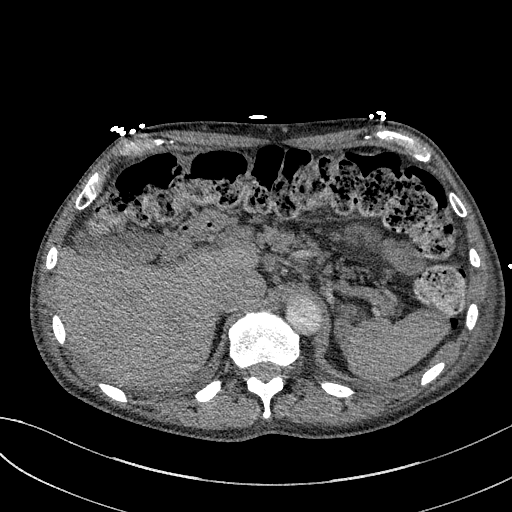
[im 7/153  lung]
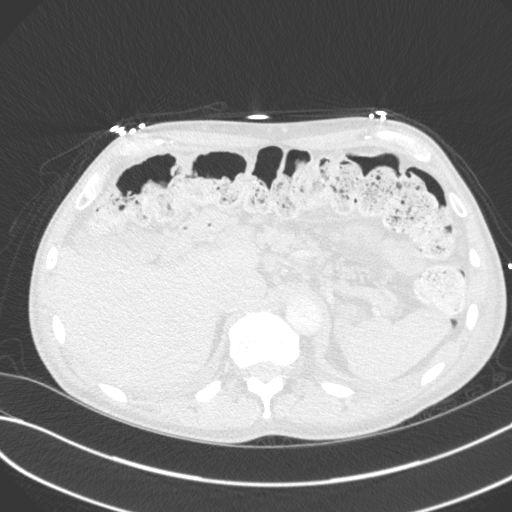
[im 20/153  lung]
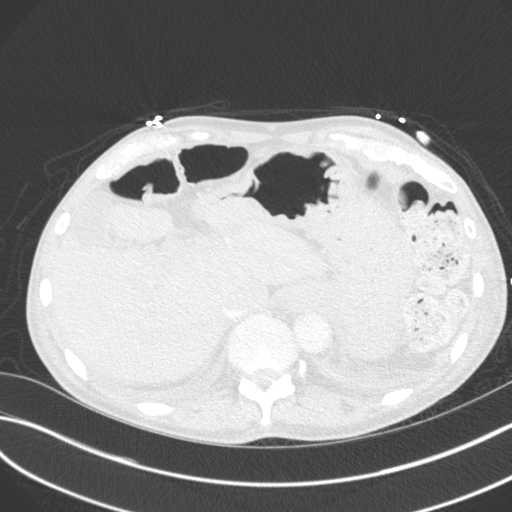
[im 34/153  lung]
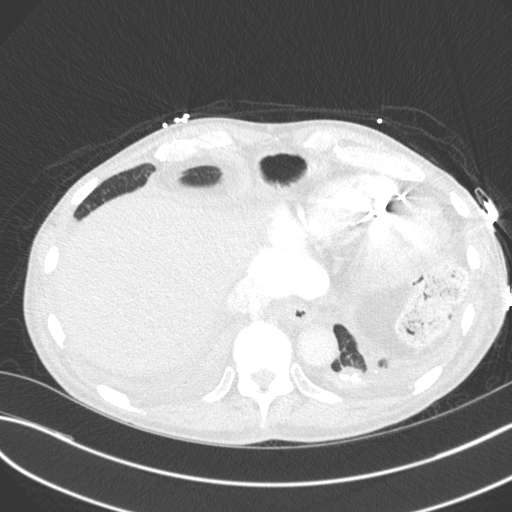
[im 47/153  lung]
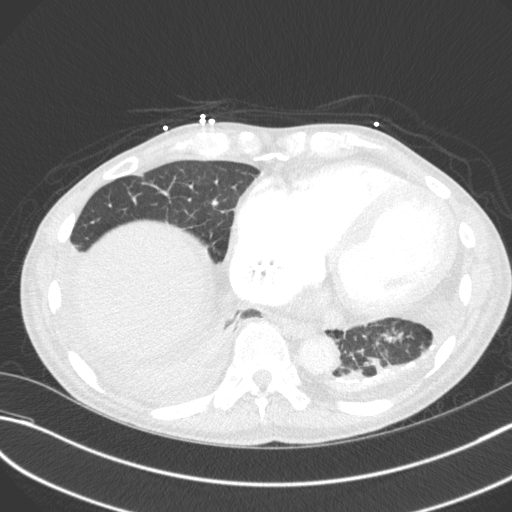
[im 60/153  mediastinal]
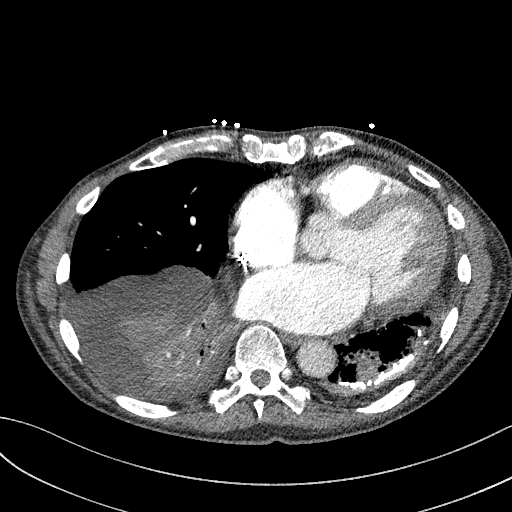
[im 60/153  lung]
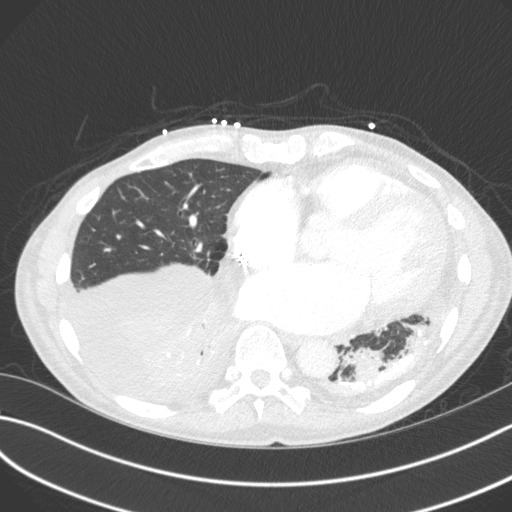
[im 73/153  lung]
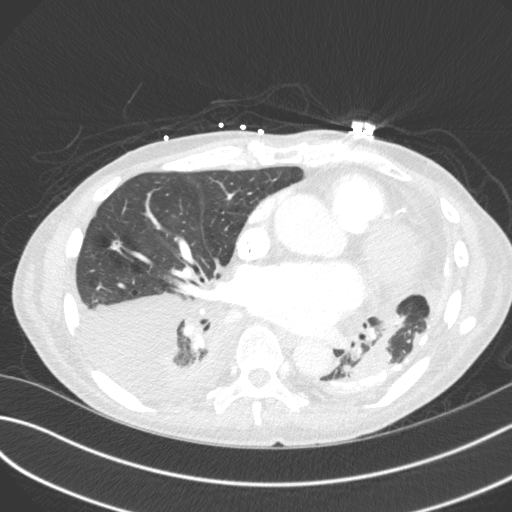
[im 80/153  lung]
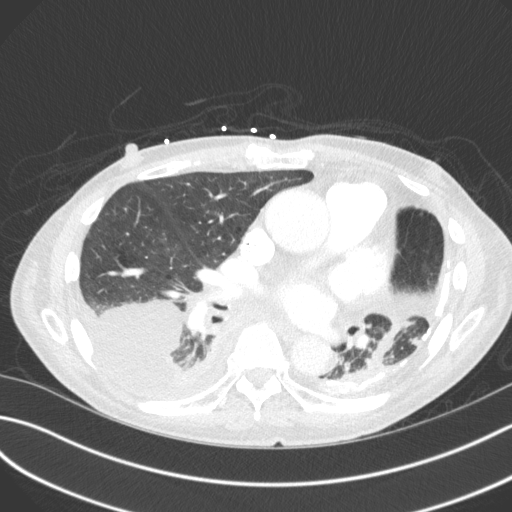
[im 93/153  lung]
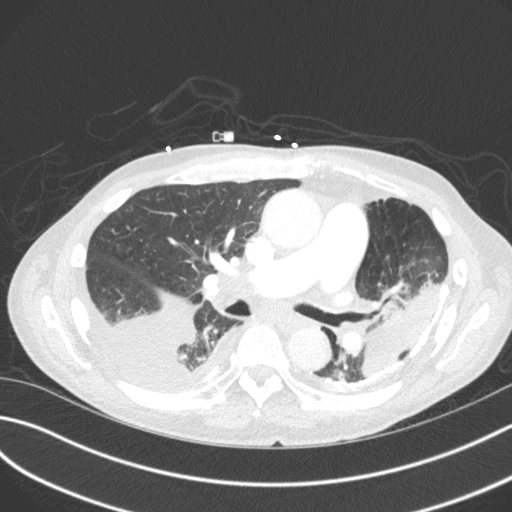
[im 106/153  mediastinal]
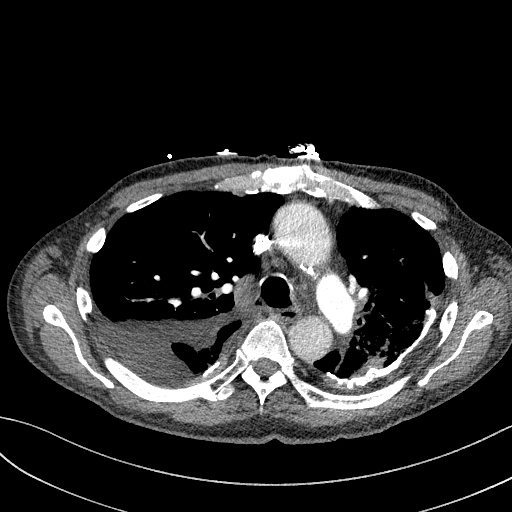
[im 106/153  lung]
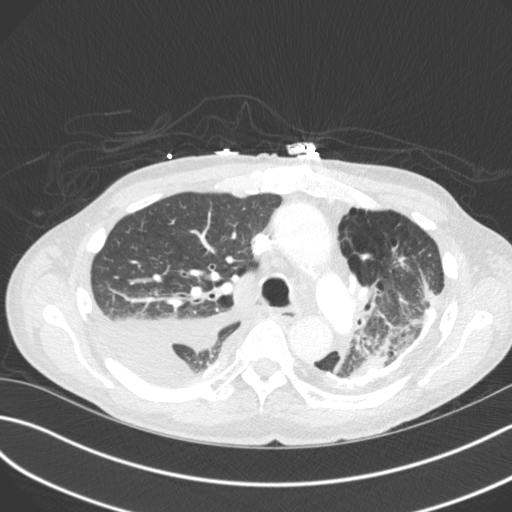
[im 119/153  lung]
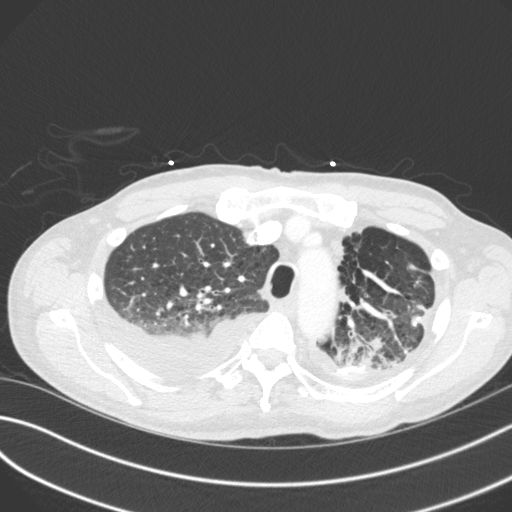
[im 133/153  lung]
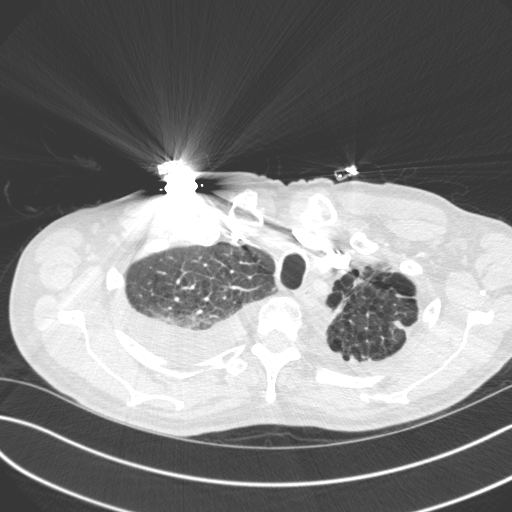
[im 146/153  lung]
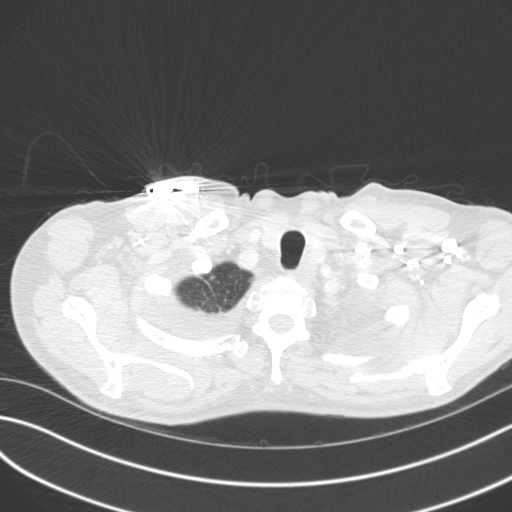

[Series 6: chest with 3mm st cor · coronal · 0.62mm/px · 1 of 77 slices shown]
[im 39/77  lung]
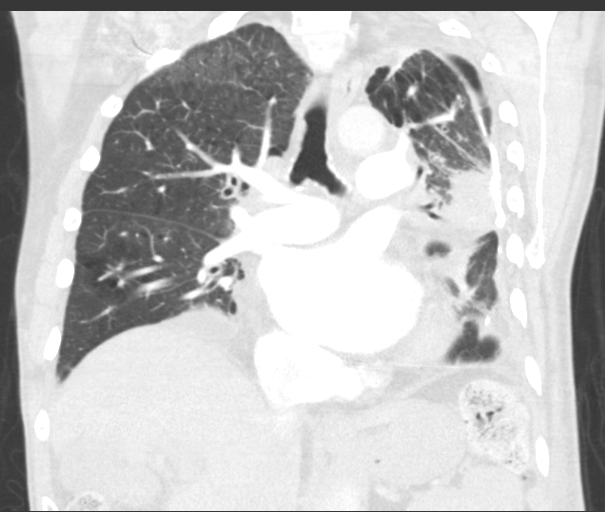

[13 of 36 positions shown; findings below may reference images not displayed]

FINDINGS: Mediastinum/Nodes: Stable mild cardiomegaly. Two lead right
subclavian pacemaker is noted with lead tips in the right
ventricular apex. No pericardial fluid/thickening. Left anterior
descending coronary atherosclerosis. Ascending thoracic aortic
cm aneurysm, stable. Dilated main pulmonary artery (3.6 cm
diameter), increased from 3.3 cm. No central pulmonary emboli.
Normal visualized thyroid. Normal esophagus. No axillary adenopathy.
Mildly enlarged 1.1 cm subcarinal node (series 4/ image 71), new.
Mild bilateral hilar adenopathy, new, largest 1.3 cm on the right
(series 4/ image 59) and 1.1 cm on the left (series 4/image 60).

Lungs/Pleura: No pneumothorax. There are extensive pleural plaques
throughout the left greater than right pleural spaces, unchanged.
Small right greater than left pleural effusions are increased
bilaterally. There is a pleural-based 1.8 x 1.6 cm nodular focus of
consolidation in the posterior left lower lobe (series 5/ image 95)
with surrounding parenchymal bands, mildly increased from 1.7 x
cm on 06/02/2013. There is a separate thick transpulmonic bandlike
focus of consolidation at the periphery of the left upper and left
lower lobes with associated volume loss and distortion,
significantly increased from 06/02/2013 (series 5/image 60). There
is near complete consolidation and significant volume loss in the
right lower lobe, significantly worsened since 06/02/2013. There is
mild centrilobular emphysema and diffuse bronchial wall thickening.
There is interlobular septal thickening in both lungs, predominantly
in the upper lobes.

Upper abdomen: Unremarkable.

Musculoskeletal: No aggressive appearing focal osseous lesions. Mild
degenerative changes in the thoracic spine. Symmetric mild
gynecomastia.
IMPRESSION: 1. Findings of asbestos related pleural disease, including extensive
calcified pleural plaques in the left greater than right pleural
spaces and small bilateral pleural effusions, right greater than
left, increased from 8088.
2. Near complete right lower lobe consolidation, volume loss and
distortion. Thick transpulmonic bandlike opacity in the periphery of
the left lung and nodular pleural based focus of consolidation in
the basilar left lower lobe, with associated distortion and volume
loss. These findings have significantly worsened since 8088 and are
favored to represent the progressive sequela of asbestos related
pleural disease with rounded atelectasis and extensive lung
scarring. An underlying component of pneumonia or tumor is difficult
to exclude. Management options include short-term chest CT follow-up
and/or PET-CT.
3. New mild subcarinal and bilateral hilar adenopathy, nonspecific.
4. Mild cardiomegaly. Increased dilatation of the main pulmonary
artery, suggesting worsening pulmonary hypertension. Mild
interlobular septal thickening in the upper lobes suggests a
component of mild pulmonary edema.

## 2016-12-21 IMAGING — CR DG CHEST 1V PORT
1 series · 1 of 1 positions shown · non-contrast
Comparison: 09/27/2015

CLINICAL DATA: Status post right thoracentesis

EXAM:
PORTABLE CHEST 1 VIEW

[AP]
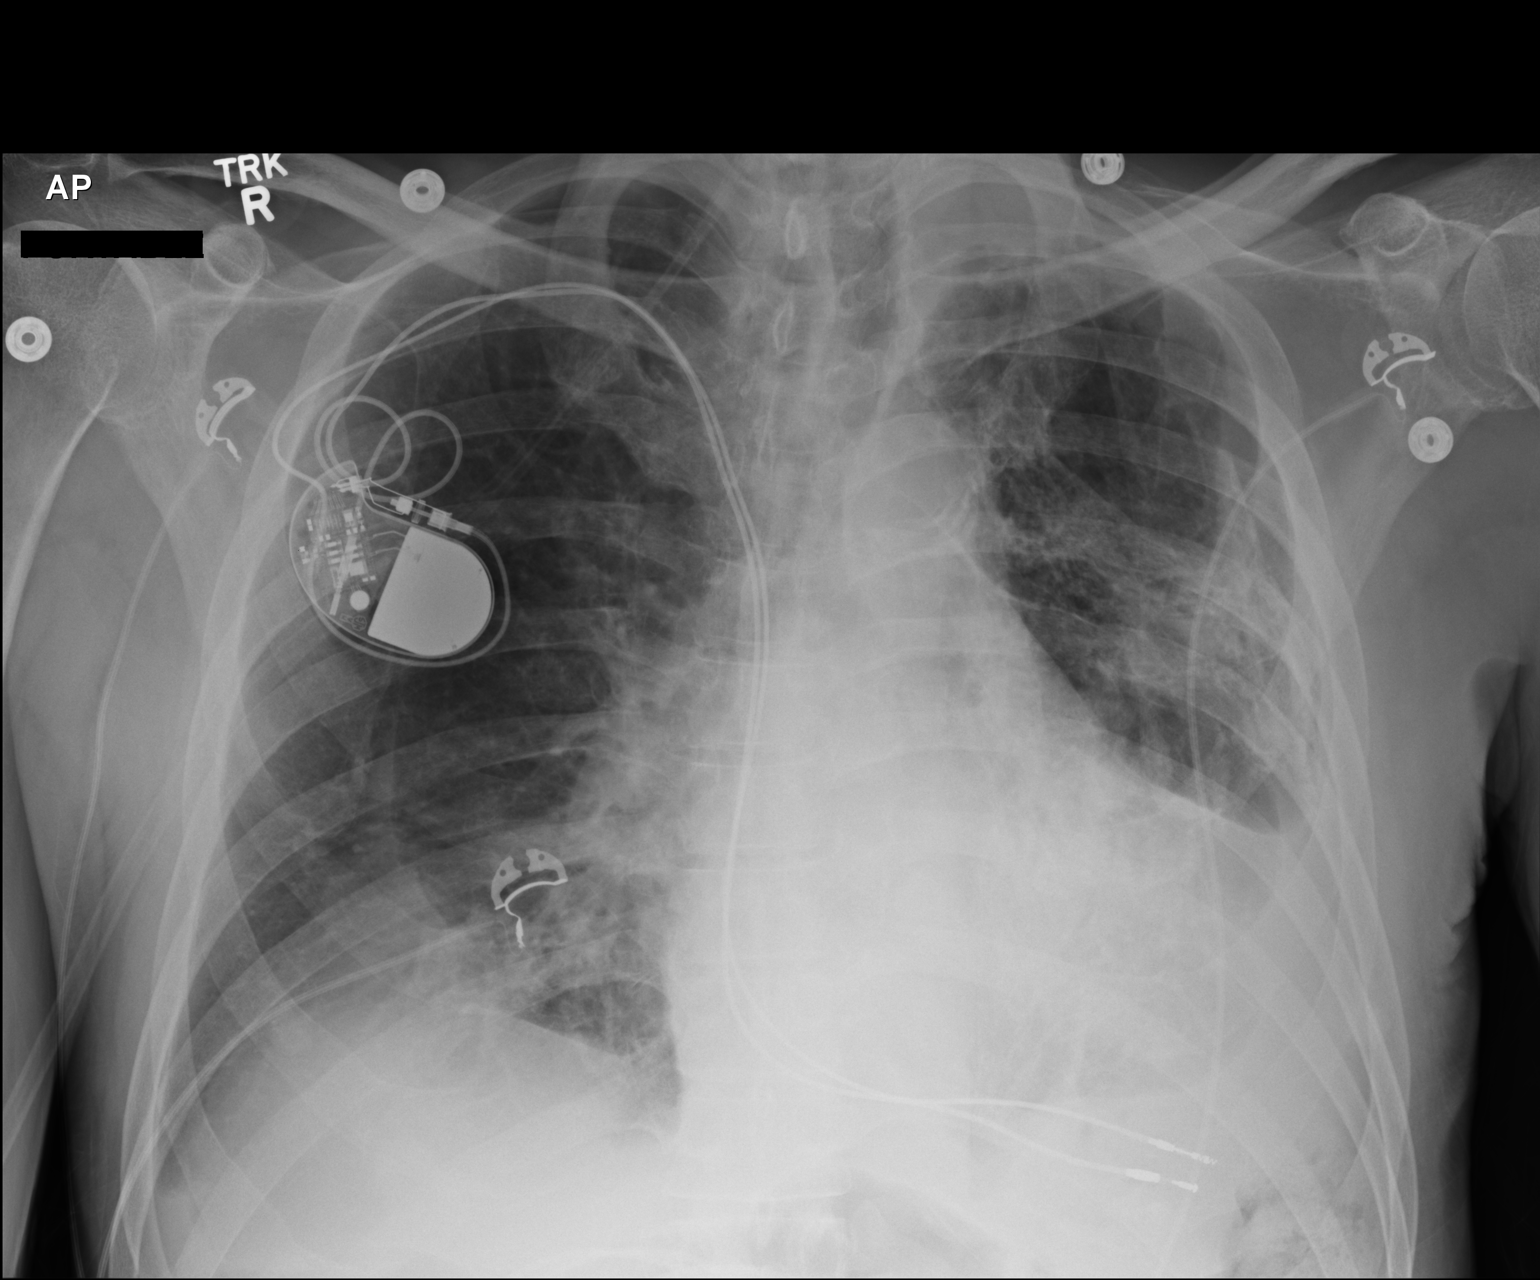

[1 of 1 positions shown; findings below may reference images not displayed]

FINDINGS: Cardiac shadow is stable. Pacing device is again seen and stable.
Left pleural effusion is again seen and stable. Chronic changes in
the left mid lung are again noted. There is been interval
right-sided thoracentesis with significant reduction in the
right-sided pleural effusion. No pneumothorax is seen. No new focal
abnormality is noted.
IMPRESSION: No pneumothorax following thoracentesis on the right.

## 2016-12-24 IMAGING — CT CT CHEST W/O CM
2 of 4 series · 15 of 36 positions shown, 18 images · non-contrast
Comparison: 09/27/2015

CLINICAL DATA: Pleural effusion.

EXAM:
CT CHEST WITHOUT CONTRAST
TECHNIQUE: Multidetector CT imaging of the chest was performed following the
standard protocol without IV contrast.

[Series 3: thorax 2.0 · axial · 0.68mm/px · z∈[+1296,+1600]mm · 12 of 170 slices shown, 15 images]
[im 9/170  mediastinal]
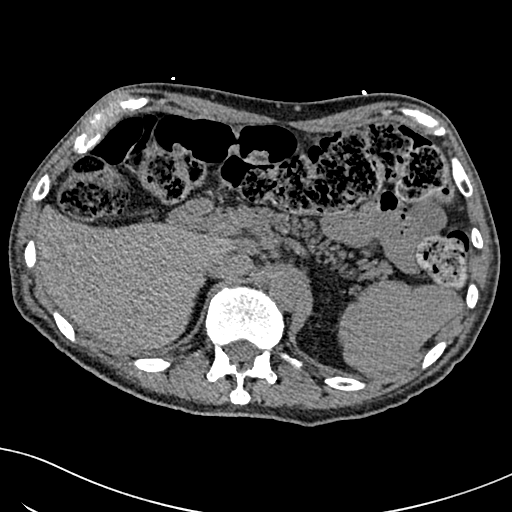
[im 9/170  lung]
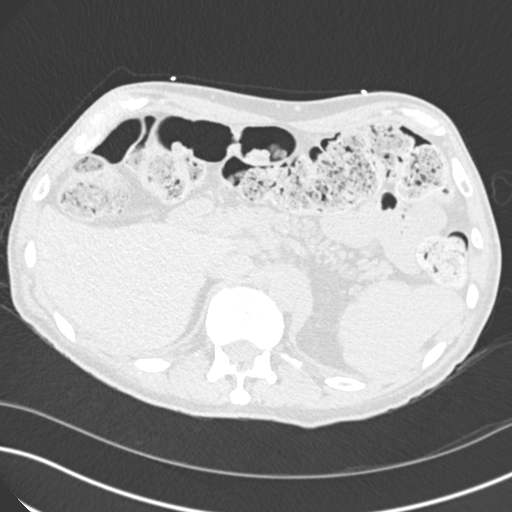
[im 27/170  lung]
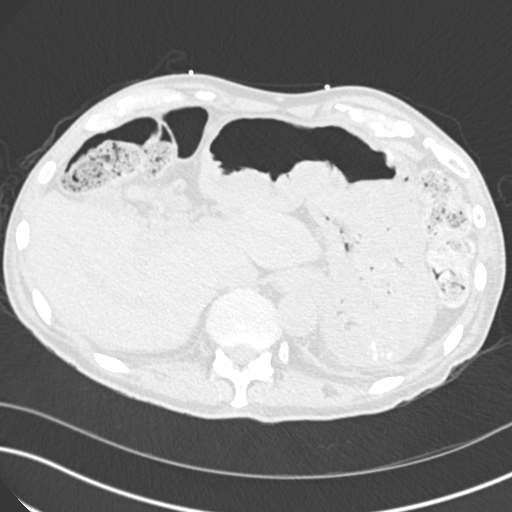
[im 36/170  lung]
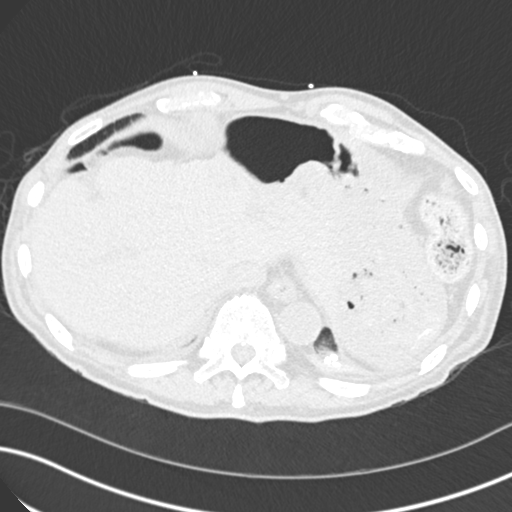
[im 54/170  lung]
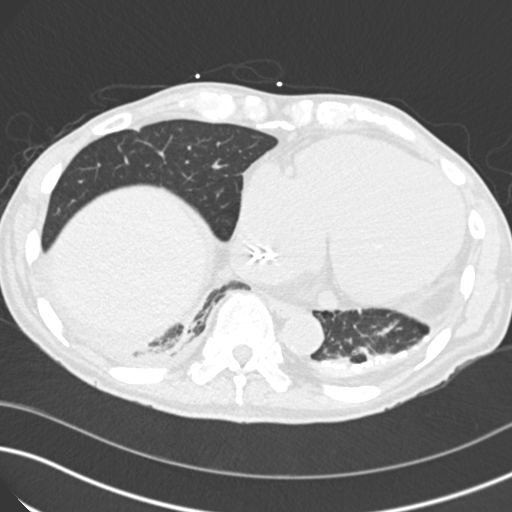
[im 63/170  mediastinal]
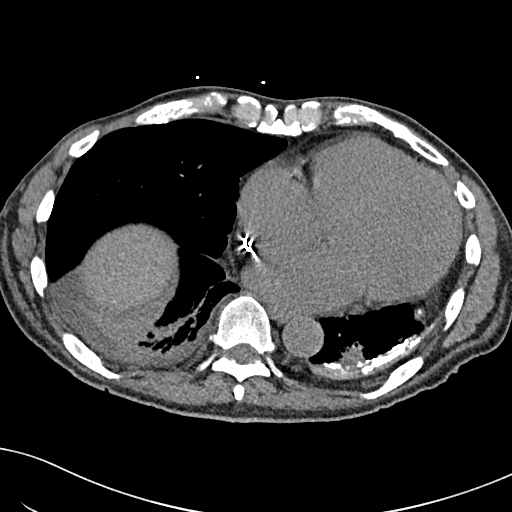
[im 63/170  lung]
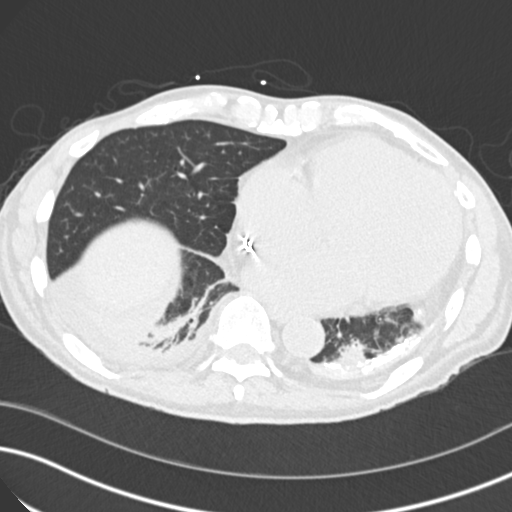
[im 81/170  lung]
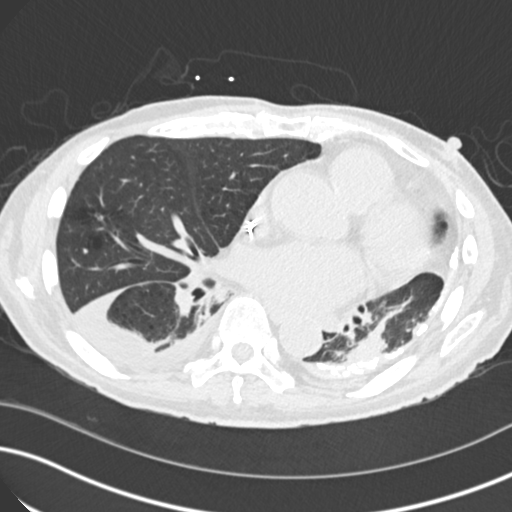
[im 89/170  lung]
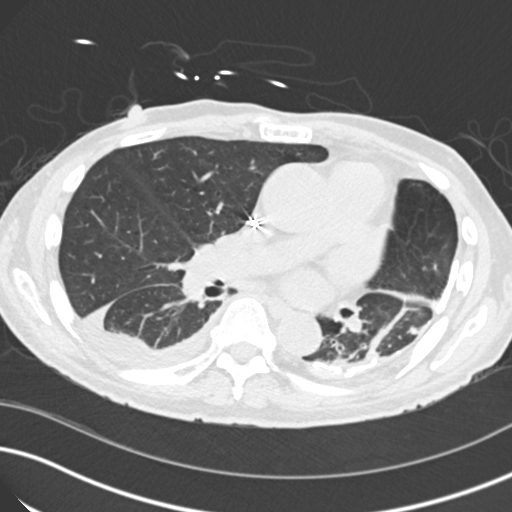
[im 107/170  lung]
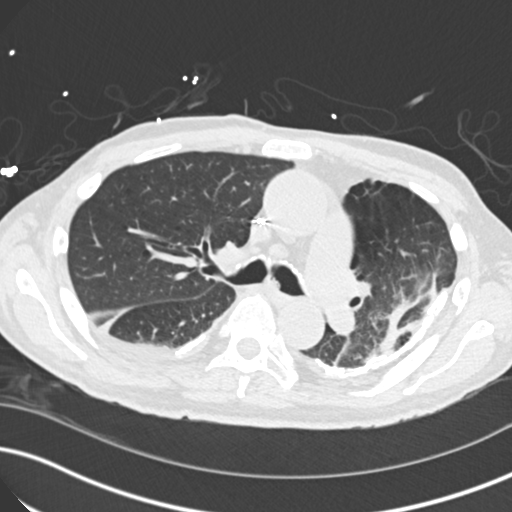
[im 116/170  mediastinal]
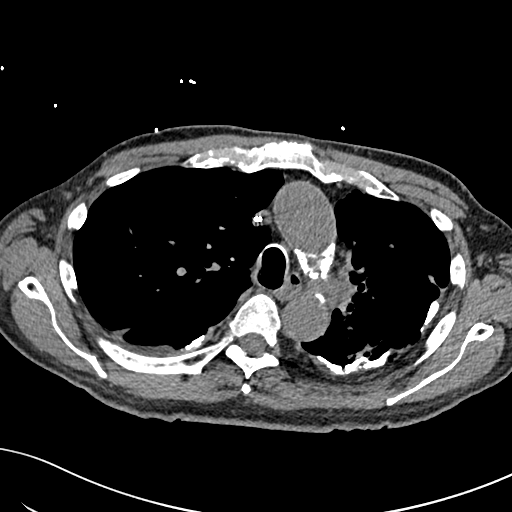
[im 116/170  lung]
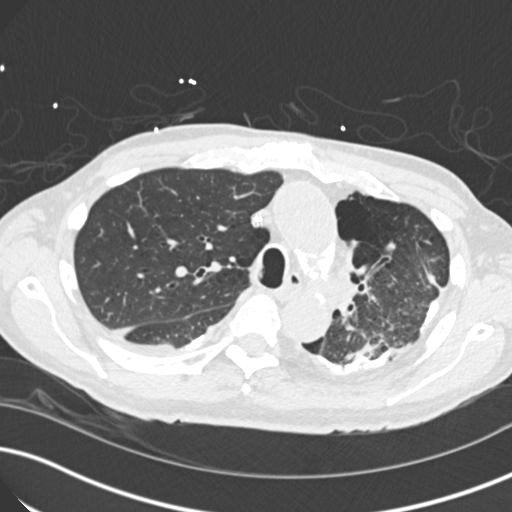
[im 134/170  lung]
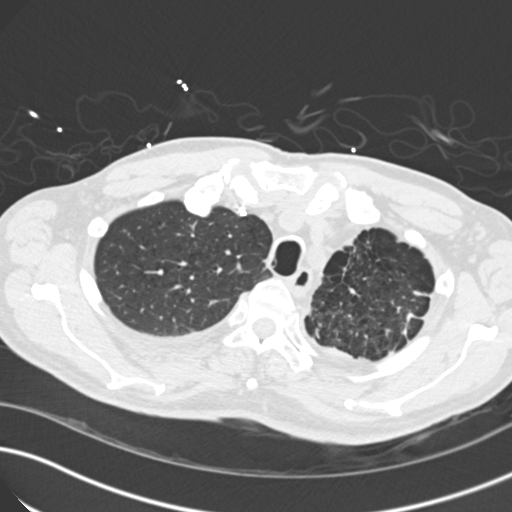
[im 143/170  lung]
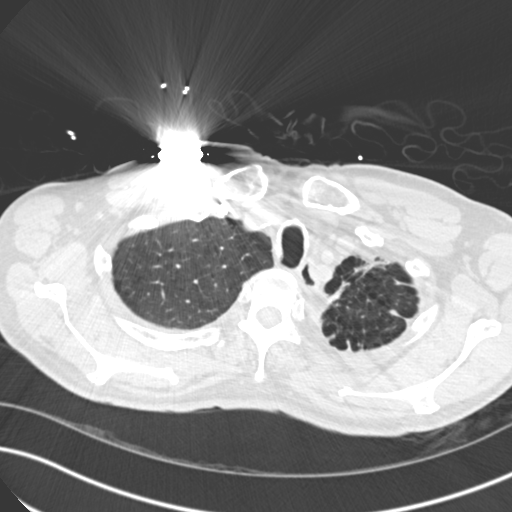
[im 161/170  lung]
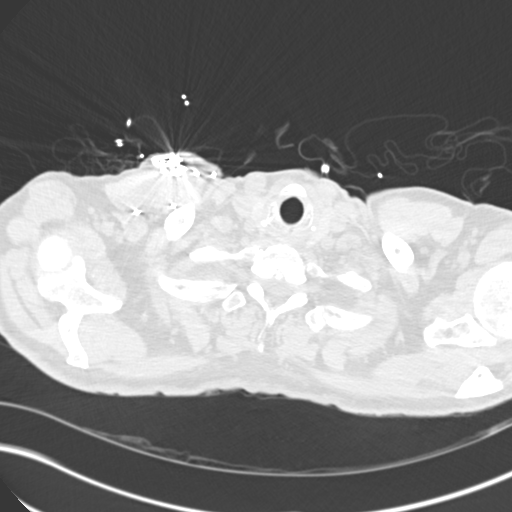

[Series 6: coronal · coronal · 0.66mm/px · 3 of 68 slices shown]
[im 14/68  lung]
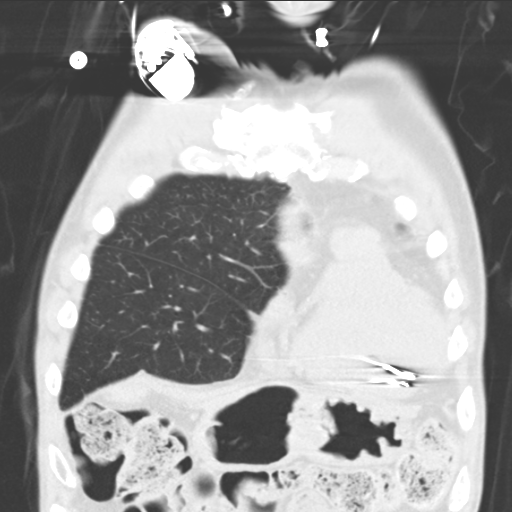
[im 27/68  lung]
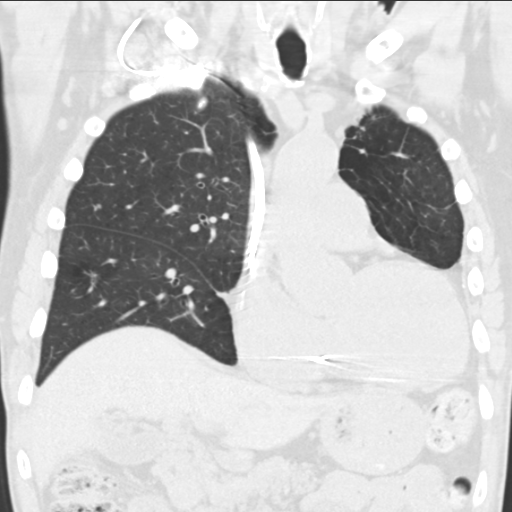
[im 41/68  lung]
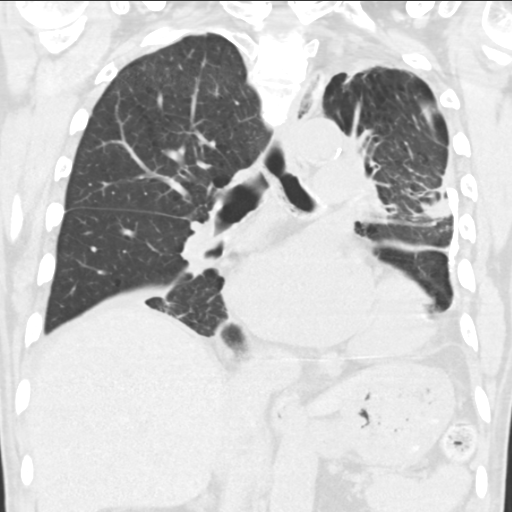

[15 of 36 positions shown; findings below may reference images not displayed]

FINDINGS: Mediastinum / Lymph Nodes: There is no axillary lymphadenopathy. 7
mm short axis prevascular lymph node on the previous study has
decreased to 3 mm today. Right hilar lymph node measured on the
previous study cannot be reliably measured on today's exam given the
lack of intravenous contrast material. There was a subcarinal lymph
node on the prior study measuring 12 mm in short axis. This is
stable at 12 mm. The heart is enlarged. Coronary artery
calcification is noted. Right-sided permanent pacemaker again noted.
No pericardial effusion.

Lungs / Pleura: Asymmetric, left greater than right apical scarring
again noted. There is centrilobular and paraseptal emphysema in the
lungs. Right pleural effusion has decreased in the interval.
Calcified pleural plaques are seen bilaterally, left greater than
right persistent. Chronic atelectasis and scarring is noted in the
lungs bilaterally, slightly improved on the left.

Upper Abdomen:  Unremarkable.

[HOSPITAL] / Soft Tissues: Bone windows reveal no worrisome lytic or
sclerotic osseous lesions.
IMPRESSION: 1. Interval decrease in right pleural effusion. The volume loss/
collapse/scarring seen in the lower lobes persists, but shows
evidence of interval improvement, left greater than right. Interval
decreasing pleural fluid volume and improved basilar expansion
bilaterally is reassuring. Resolving pneumonia or atelectasis view
consideration. Consider continued follow-up as there is extensive
bilateral lower lobe disease, likely asbestos related.
2. Bilateral calcified pleural plaques, compatible with previous
asbestos exposure.
3. Stable to decreased mediastinal lymphadenopathy.
4. Cardiomegaly
5. Emphysema.

## 2016-12-25 ENCOUNTER — Other Ambulatory Visit: Payer: Self-pay | Admitting: *Deleted

## 2016-12-25 NOTE — Patient Outreach (Signed)
[facility]) Care Management  12/25/2016  [name] 1945/12/05 [id number]   Unsuccessful outreach letter sent to member on 7/25, no response.  Will close case at this time.  Will notify primary MD and care management assistant, will send member case closure letter.  [name], [place], MSN [place] [phone]

## 2017-01-06 ENCOUNTER — Other Ambulatory Visit: Payer: Self-pay | Admitting: *Deleted

## 2017-01-06 ENCOUNTER — Other Ambulatory Visit: Payer: Self-pay | Admitting: Internal Medicine

## 2017-01-06 DIAGNOSIS — Z79891 Long term (current) use of opiate analgesic: Secondary | ICD-10-CM

## 2017-01-06 MED ORDER — OXYCODONE-ACETAMINOPHEN 10-325 MG PO TABS: 1.0000 | ORAL_TABLET | ORAL | 0 refills | Status: DC | PRN

## 2017-01-06 NOTE — Telephone Encounter (Signed)
Refill Request.  

## 2017-01-06 NOTE — Telephone Encounter (Signed)
Refill Request for Oxycodone

## 2017-01-08 NOTE — Telephone Encounter (Signed)
informed

## 2017-01-08 NOTE — Telephone Encounter (Signed)
This has been done.

## 2017-01-21 IMAGING — DX DG CHEST 2V
3 series · 3 of 3 positions shown · non-contrast
Comparison: 09/29/2015

CLINICAL DATA: Followup right pleural effusion

EXAM:
CHEST  2 VIEW

[chest pa (1 of 2)]
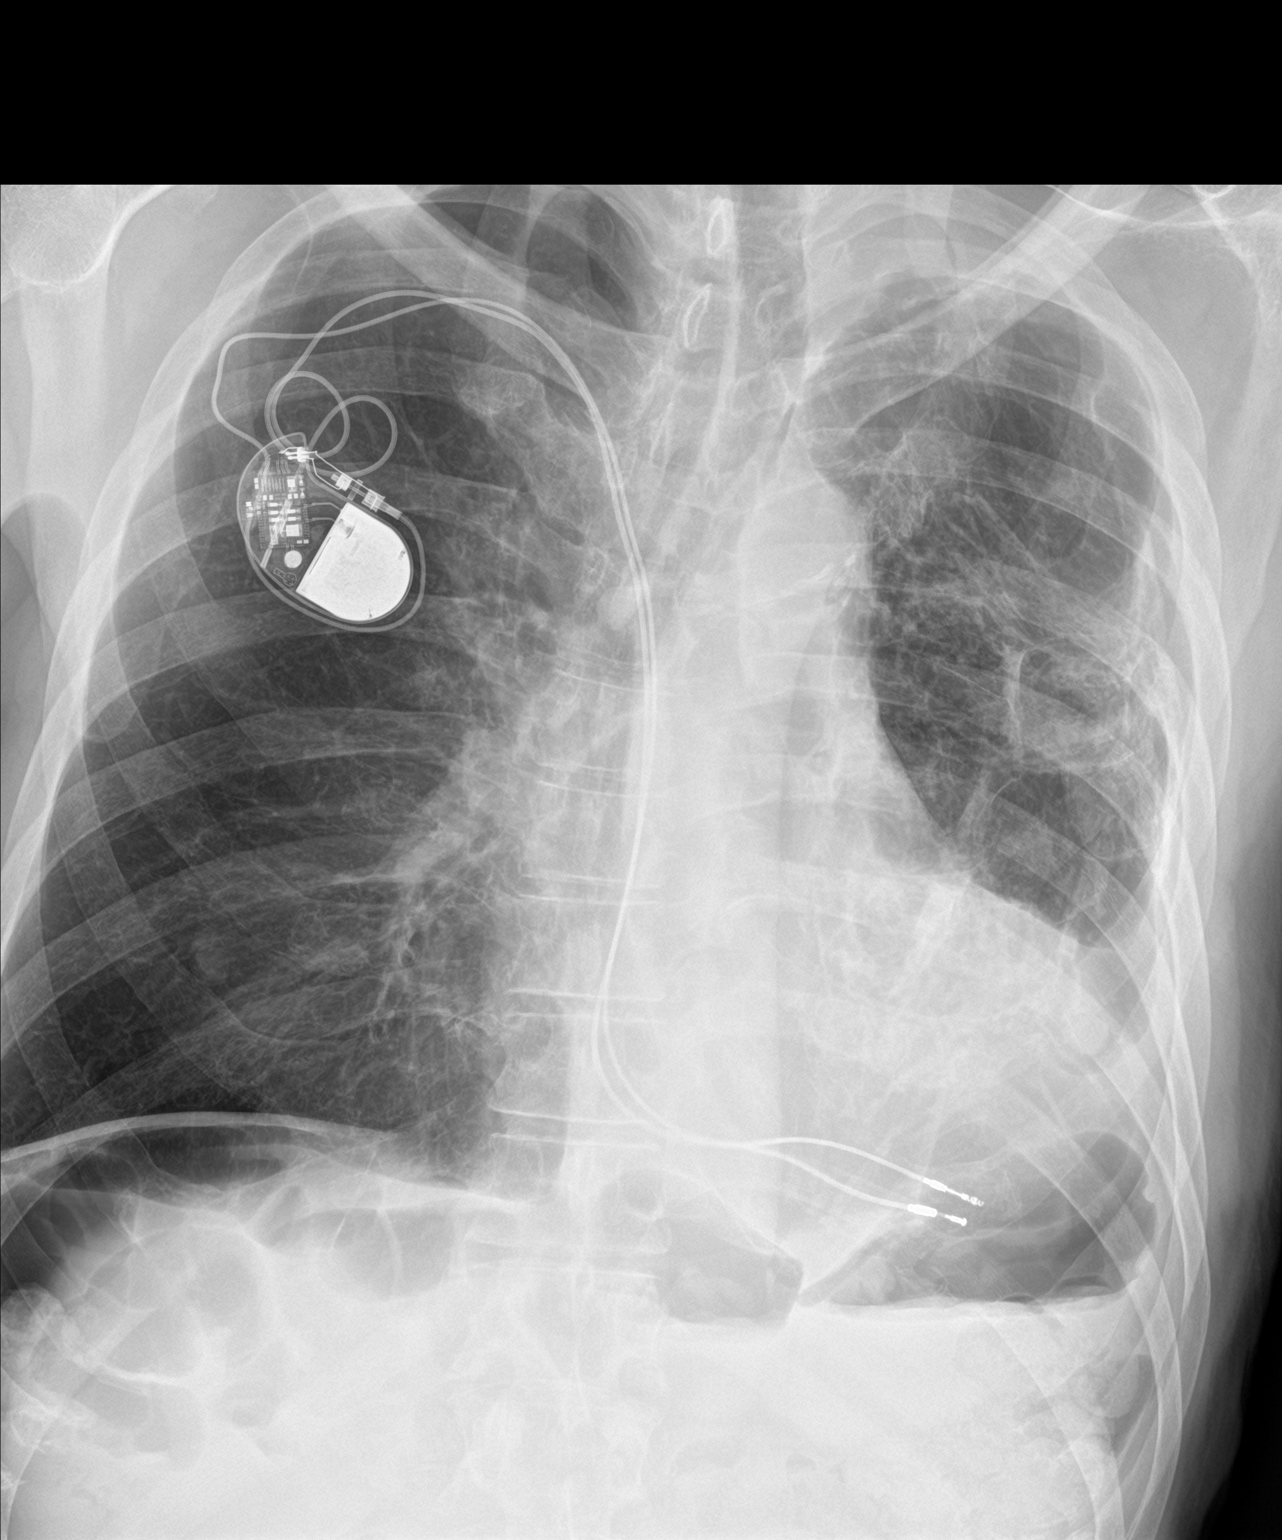

[chest lat]
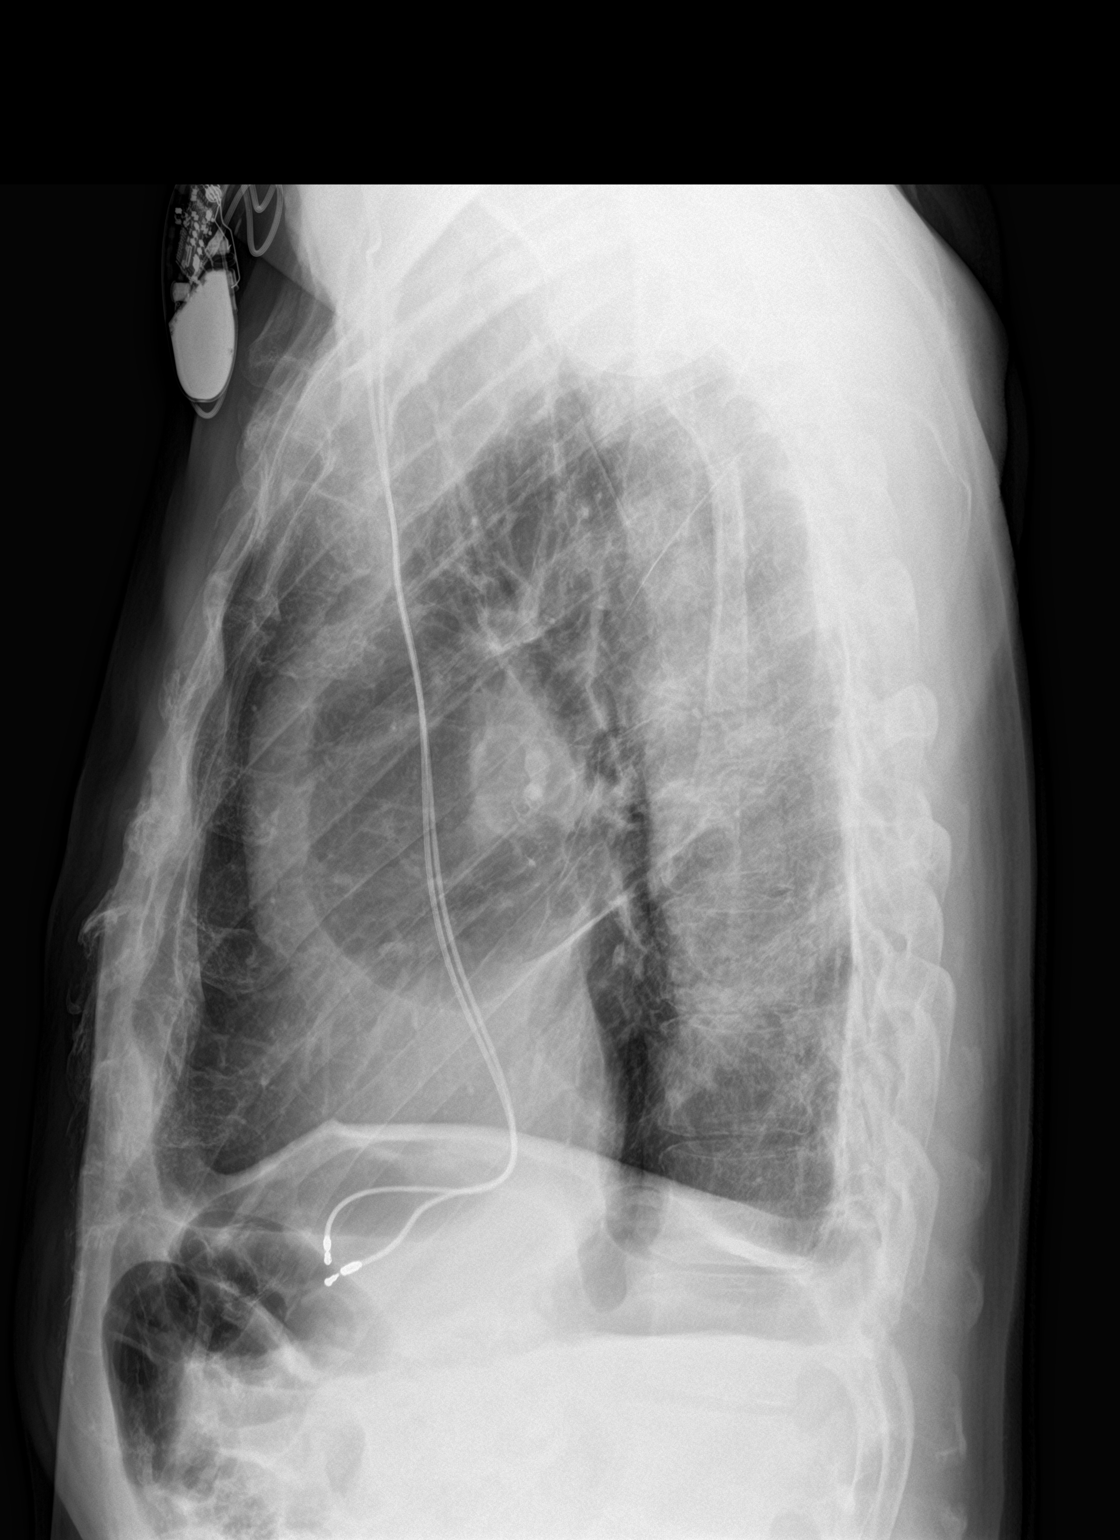

[chest pa (2 of 2)]
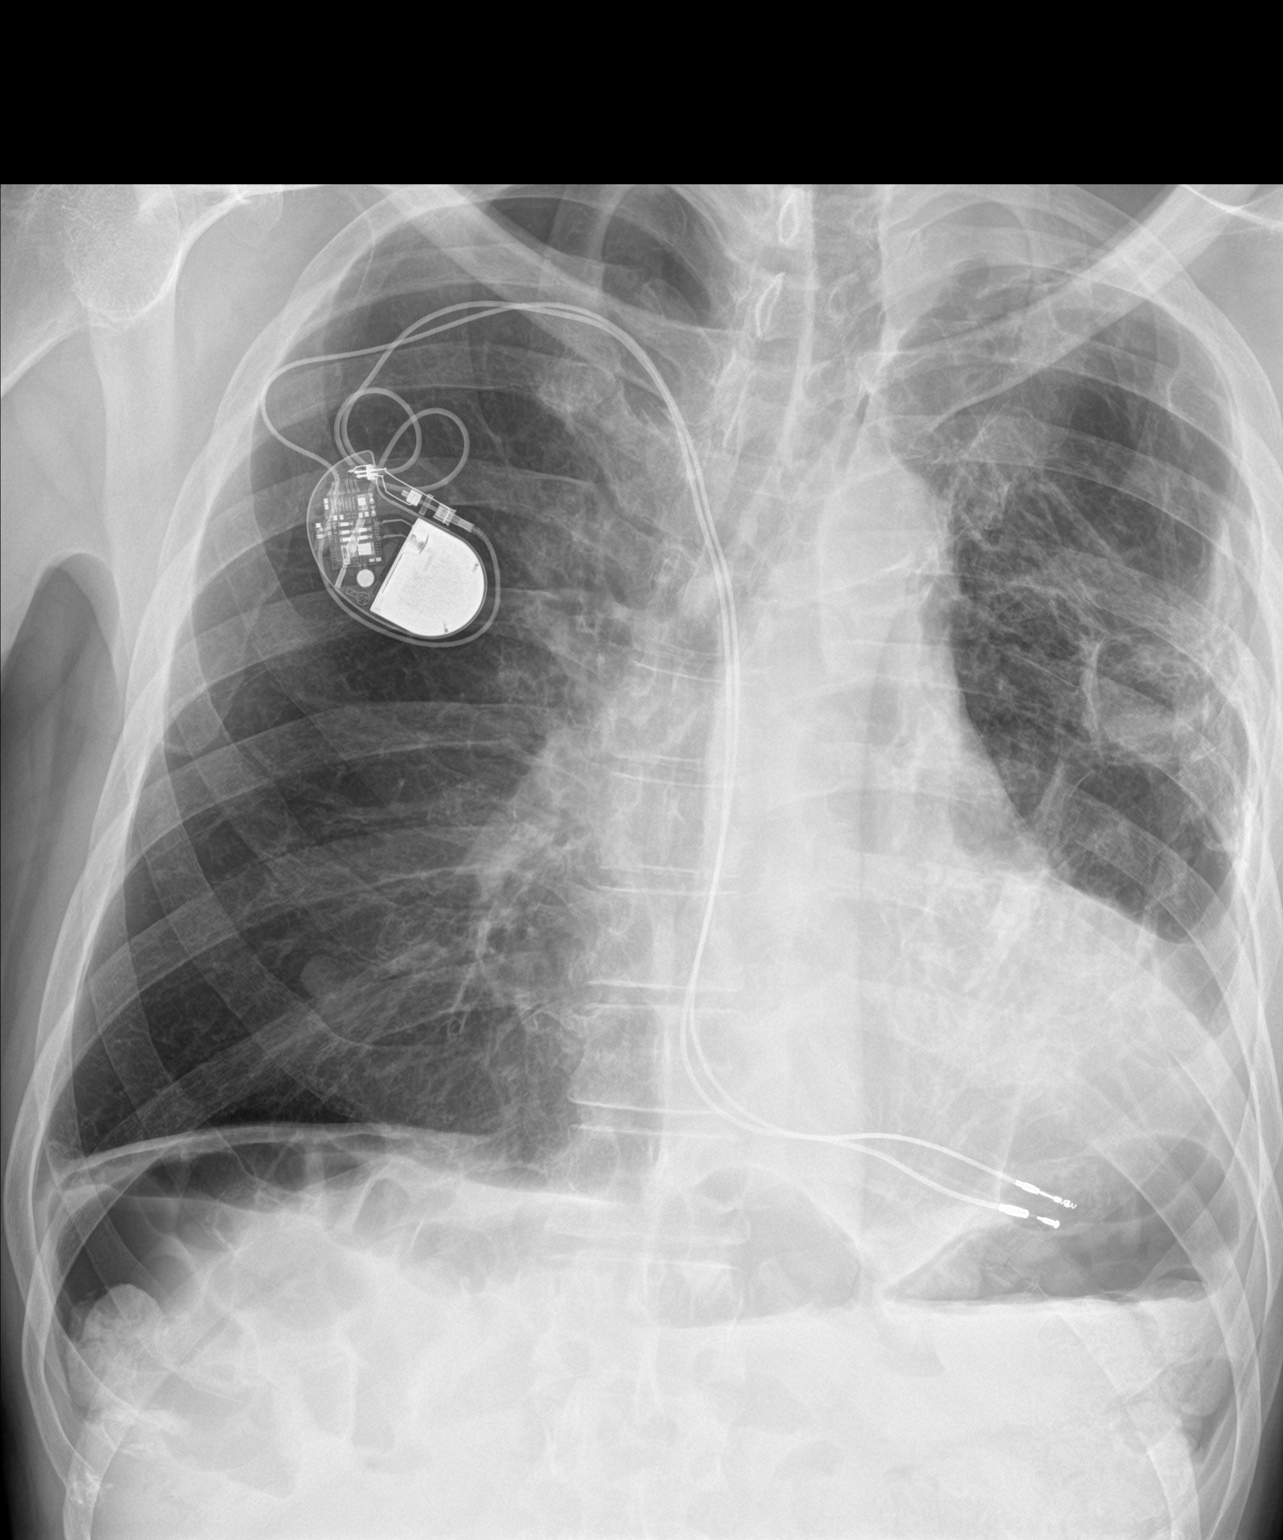

[3 of 3 positions shown; findings below may reference images not displayed]

FINDINGS: Cardiac shadow is stable. Stable pacing device is seen. The right
lung is clear. No definitive pneumothorax or pleural effusion is
seen. Chronic changes are again seen on the left and stable.
IMPRESSION: Chronic changes on the left.  No recurrent effusion on the right.

## 2017-02-03 ENCOUNTER — Other Ambulatory Visit: Payer: Self-pay | Admitting: Internal Medicine

## 2017-02-03 DIAGNOSIS — Z79891 Long term (current) use of opiate analgesic: Secondary | ICD-10-CM

## 2017-02-03 NOTE — Telephone Encounter (Signed)
Pls call pt regarding refill °

## 2017-02-05 MED ORDER — OXYCODONE-ACETAMINOPHEN 10-325 MG PO TABS: 1.0000 | ORAL_TABLET | ORAL | 0 refills | Status: DC | PRN

## 2017-02-05 NOTE — Telephone Encounter (Signed)
Percocet rx ready for pick up; pt called / informed.

## 2017-02-06 IMAGING — CT NM PET TUM IMG INITIAL (PI) SKULL BASE T - THIGH
7 series · 25 of 25 positions shown · non-contrast
Comparison: 10/02/15

CLINICAL DATA: Initial treatment strategy for mesothelioma

EXAM:
NUCLEAR MEDICINE PET SKULL BASE TO THIGH
TECHNIQUE: 7.8 mCi F-18 FDG was injected intravenously. Full-ring PET imaging
was performed from the skull base to thigh after the radiotracer. CT
data was obtained and used for attenuation correction and anatomic
localization.
FASTING BLOOD GLUCOSE:  Value: 82 mg/dl

[Series 3: pet sk_thigh ac · axial · 5.0mm · 4.07mm/px · z∈[-962,-26]mm · 6 of 235 slices shown]
[im 1/235]
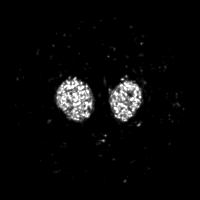
[im 47/235]
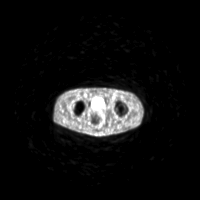
[im 94/235]
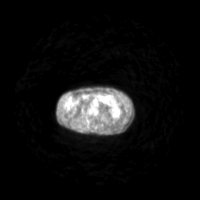
[im 141/235]
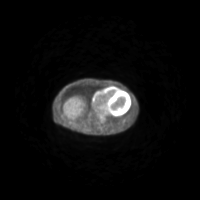
[im 188/235]
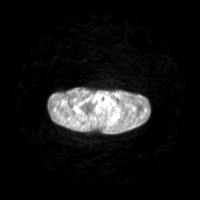
[im 235/235]
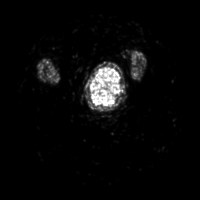

[Series 4: ct sk_thigh 5.0 b31f · axial · 5.0mm · 0.98mm/px · z∈[-962,-26]mm · 5 of 235 slices shown]
[im 1/235]
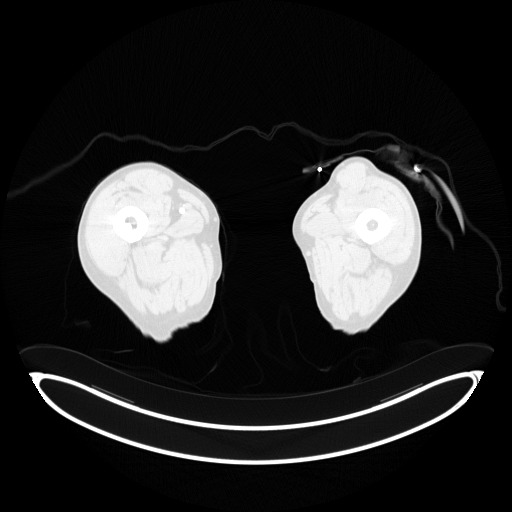
[im 59/235]
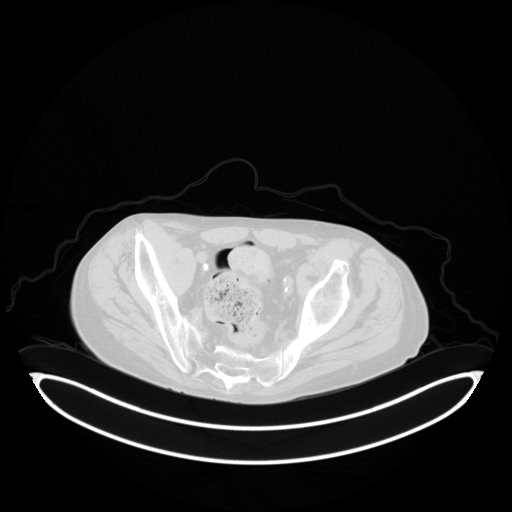
[im 118/235]
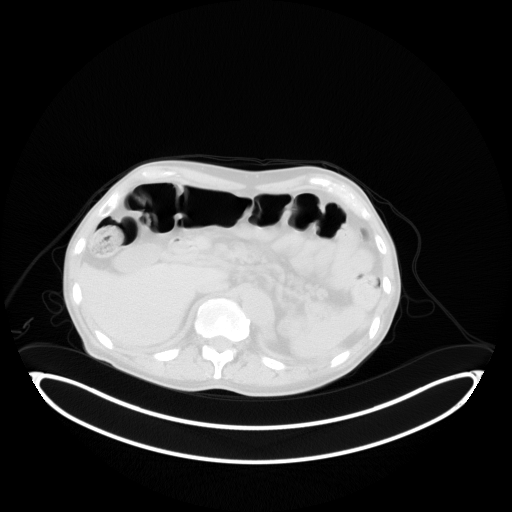
[im 176/235]
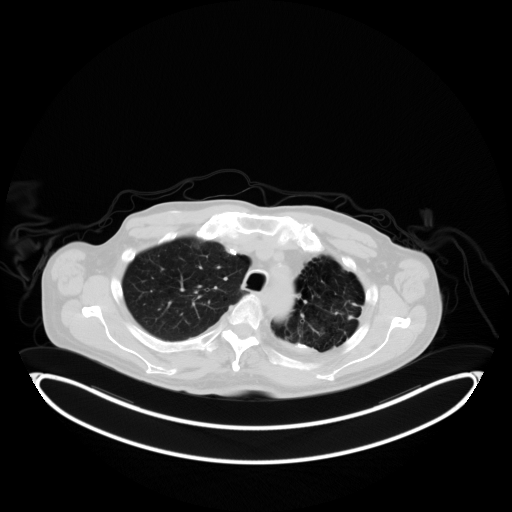
[im 235/235  brain]
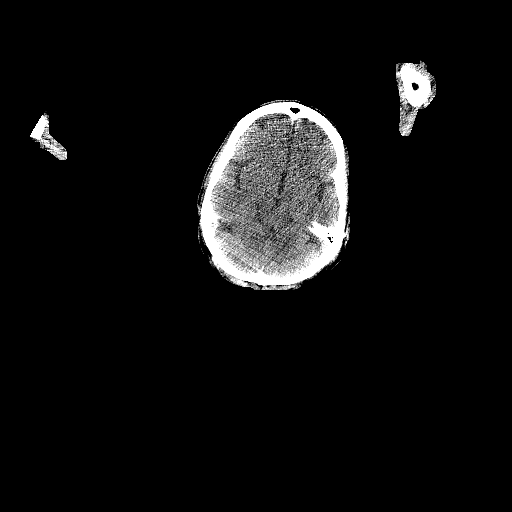

[Series 7: pet sk_thigh nac · axial · 5.0mm · 4.07mm/px · z∈[-962,-26]mm · 5 of 235 slices shown]
[im 1/235]
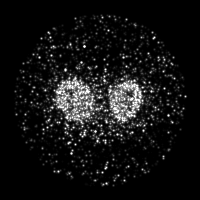
[im 59/235]
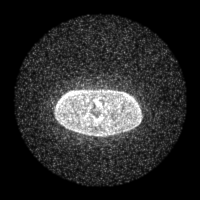
[im 118/235]
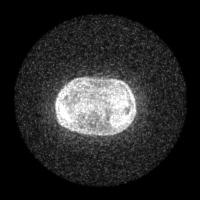
[im 176/235]
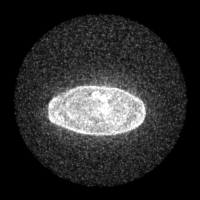
[im 235/235]
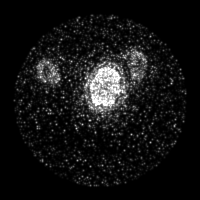

[Series 8: ct sk_thigh 5.0 b70f (id)_bone · axial · 5.0mm · 0.71mm/px · z∈[-460,-200]mm · 2 of 66 slices shown]
[im 1/66  bone]
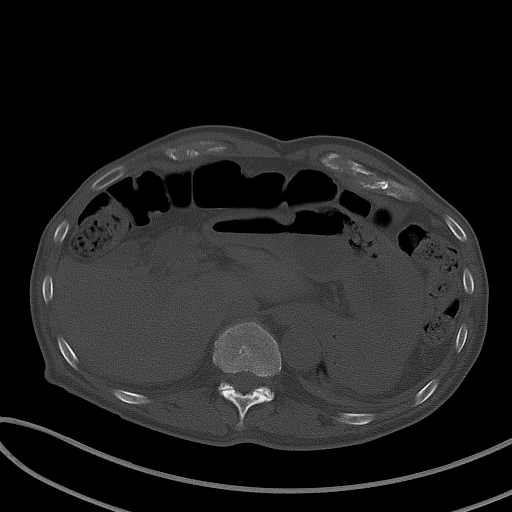
[im 66/66  bone]
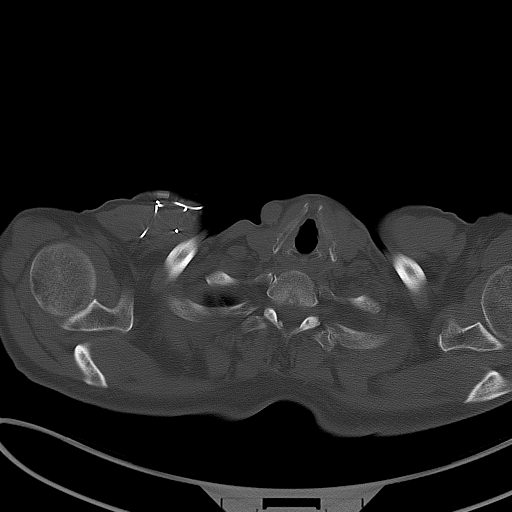

[Series 604: mip collection<mip range> · coronal · 1.94mm/px · 1 of 32 slices shown]
[im 1/32]
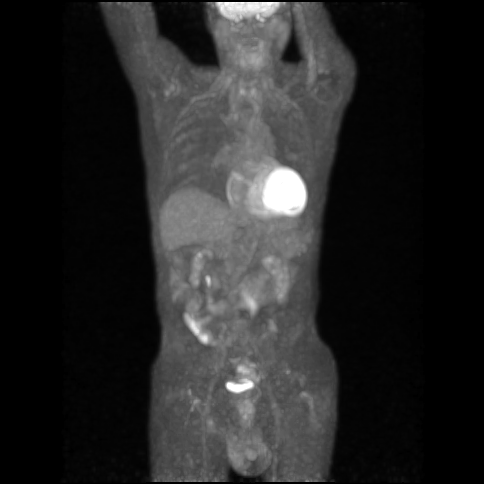

[Series 605: range-ct sk_thigh 5.0 (id)<alpha range> · 1 of 62 slices shown (1 of 2)]
[im 1/62]
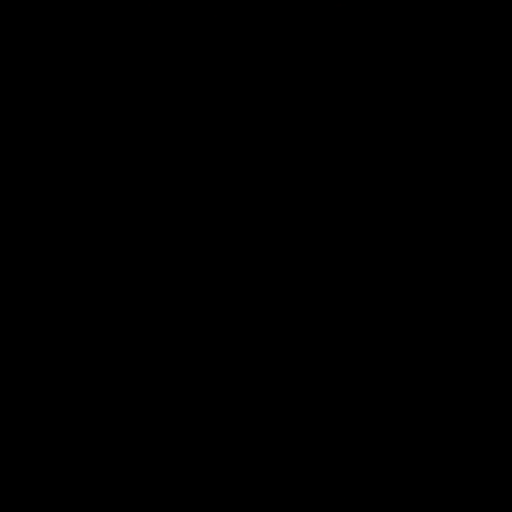

[Series 606: range-ct sk_thigh 5.0 (id)<alpha range> · 5 of 213 slices shown (2 of 2)]
[im 1/213]
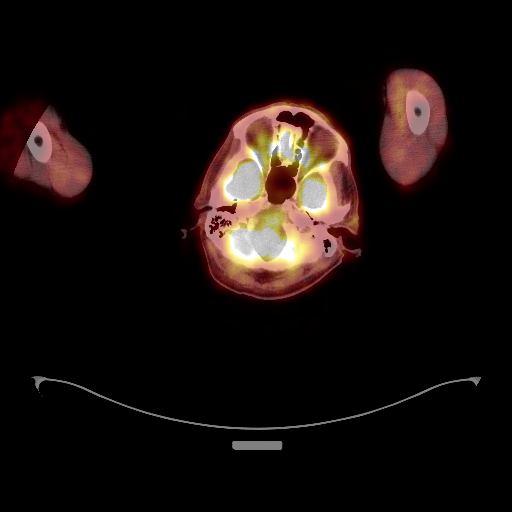
[im 54/213]
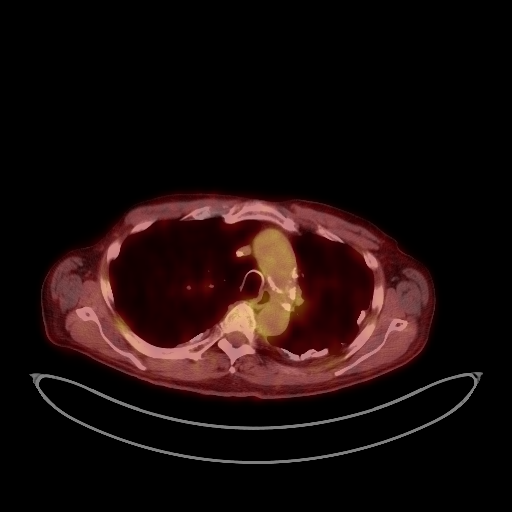
[im 107/213]
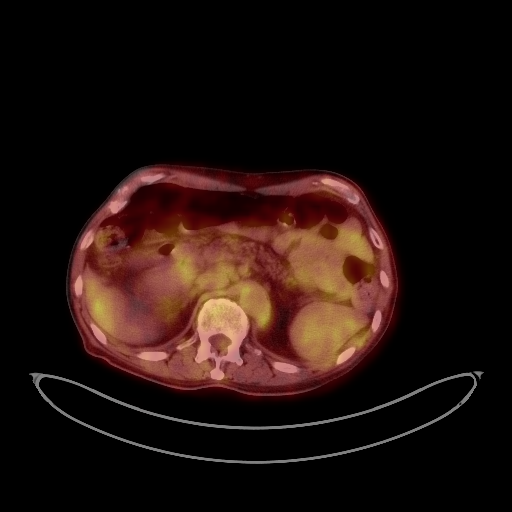
[im 160/213]
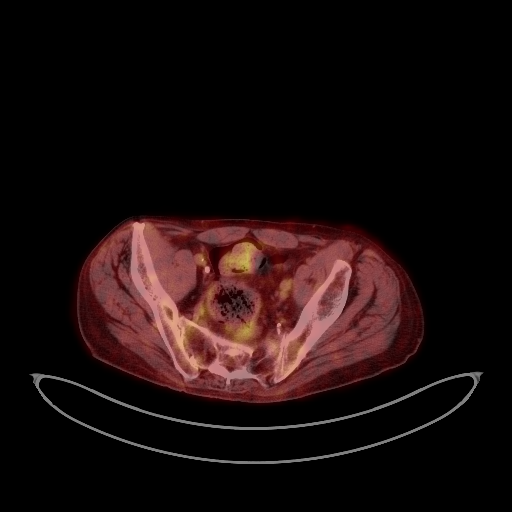
[im 213/213]
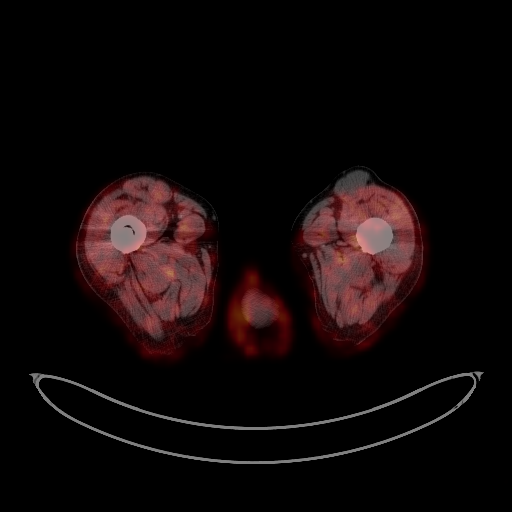

[25 of 25 positions shown; findings below may reference images not displayed]

FINDINGS: NECK

No hypermetabolic lymph nodes in the neck.

CHEST

The heart size appears mildly enlarged. Aortic atherosclerosis
noted. Calcification involving the RCA, LAD and left circumflex
coronary artery noted. There is no hypermetabolic mediastinal or
hilar lymph nodes identified. Bilateral calcified pleural plaques
are identified compatible with prior asbestos exposure. Several
areas of overlying adjacent parenchymal scarring and architectural
distortion are noted within the left lung. No hypermetabolic mass
identified within the lungs.

ABDOMEN/PELVIS

No abnormal hypermetabolic activity within the liver, pancreas,
adrenal glands, or spleen. Aortic atherosclerosis noted. No
hypermetabolic lymph nodes in the abdomen or pelvis.

SKELETON

No focal hypermetabolic activity to suggest skeletal metastasis.
IMPRESSION: 1. No hypermetabolic mass or adenopathy identified.
2. Bilateral calcified pleural plaques are noted compatible with
prior asbestos exposure.
3. Aortic atherosclerosis and coronary artery calcification.

## 2017-02-12 ENCOUNTER — Ambulatory Visit (INDEPENDENT_AMBULATORY_CARE_PROVIDER_SITE_OTHER): Payer: Medicare HMO | Admitting: Internal Medicine

## 2017-02-12 ENCOUNTER — Encounter: Payer: Self-pay | Admitting: Internal Medicine

## 2017-02-12 VITALS — BP 103/68 | HR 49 | Temp 97.8°F | Ht 75.0 in | Wt 135.9 lb

## 2017-02-12 DIAGNOSIS — Z0000 Encounter for general adult medical examination without abnormal findings: Secondary | ICD-10-CM

## 2017-02-12 DIAGNOSIS — N189 Chronic kidney disease, unspecified: Secondary | ICD-10-CM

## 2017-02-12 DIAGNOSIS — Z9114 Patient's other noncompliance with medication regimen: Secondary | ICD-10-CM

## 2017-02-12 DIAGNOSIS — F17290 Nicotine dependence, other tobacco product, uncomplicated: Secondary | ICD-10-CM

## 2017-02-12 DIAGNOSIS — I739 Peripheral vascular disease, unspecified: Secondary | ICD-10-CM

## 2017-02-12 DIAGNOSIS — R000 Tachycardia, unspecified: Secondary | ICD-10-CM

## 2017-02-12 DIAGNOSIS — Z7982 Long term (current) use of aspirin: Secondary | ICD-10-CM

## 2017-02-12 DIAGNOSIS — M25559 Pain in unspecified hip: Secondary | ICD-10-CM

## 2017-02-12 DIAGNOSIS — F17210 Nicotine dependence, cigarettes, uncomplicated: Secondary | ICD-10-CM

## 2017-02-12 DIAGNOSIS — F339 Major depressive disorder, recurrent, unspecified: Secondary | ICD-10-CM | POA: Diagnosis not present

## 2017-02-12 DIAGNOSIS — I481 Persistent atrial fibrillation: Secondary | ICD-10-CM | POA: Diagnosis not present

## 2017-02-12 DIAGNOSIS — R638 Other symptoms and signs concerning food and fluid intake: Secondary | ICD-10-CM | POA: Diagnosis not present

## 2017-02-12 DIAGNOSIS — Z950 Presence of cardiac pacemaker: Secondary | ICD-10-CM

## 2017-02-12 DIAGNOSIS — Z7901 Long term (current) use of anticoagulants: Secondary | ICD-10-CM

## 2017-02-12 DIAGNOSIS — I951 Orthostatic hypotension: Secondary | ICD-10-CM | POA: Diagnosis not present

## 2017-02-12 DIAGNOSIS — G629 Polyneuropathy, unspecified: Secondary | ICD-10-CM

## 2017-02-12 DIAGNOSIS — I9589 Other hypotension: Secondary | ICD-10-CM

## 2017-02-12 DIAGNOSIS — Z23 Encounter for immunization: Secondary | ICD-10-CM

## 2017-02-12 DIAGNOSIS — G8929 Other chronic pain: Secondary | ICD-10-CM

## 2017-02-12 DIAGNOSIS — M549 Dorsalgia, unspecified: Secondary | ICD-10-CM | POA: Diagnosis not present

## 2017-02-12 DIAGNOSIS — Z79899 Other long term (current) drug therapy: Secondary | ICD-10-CM

## 2017-02-12 DIAGNOSIS — I4811 Longstanding persistent atrial fibrillation: Secondary | ICD-10-CM

## 2017-02-12 DIAGNOSIS — M25569 Pain in unspecified knee: Secondary | ICD-10-CM

## 2017-02-12 DIAGNOSIS — Z86718 Personal history of other venous thrombosis and embolism: Secondary | ICD-10-CM

## 2017-02-12 DIAGNOSIS — I5042 Chronic combined systolic (congestive) and diastolic (congestive) heart failure: Secondary | ICD-10-CM | POA: Diagnosis not present

## 2017-02-12 DIAGNOSIS — Z79891 Long term (current) use of opiate analgesic: Secondary | ICD-10-CM

## 2017-02-12 MED ORDER — ASPIRIN 81 MG PO CHEW: 81.0000 mg | CHEWABLE_TABLET | Freq: Every day | ORAL | 12 refills | Status: AC

## 2017-02-12 MED ORDER — METOPROLOL SUCCINATE ER 50 MG PO TB24: ORAL_TABLET | ORAL | 2 refills | Status: DC

## 2017-02-12 MED ORDER — POLYETHYLENE GLYCOL 3350 17 G PO PACK: 17.0000 g | PACK | Freq: Every day | ORAL | 0 refills | Status: DC | PRN

## 2017-02-12 MED ORDER — MIDODRINE HCL 5 MG PO TABS: 5.0000 mg | ORAL_TABLET | Freq: Three times a day (TID) | ORAL | 2 refills | Status: DC

## 2017-02-12 MED ORDER — ROSUVASTATIN CALCIUM 20 MG PO TABS: ORAL_TABLET | ORAL | 3 refills | Status: DC

## 2017-02-12 MED ORDER — APIXABAN 2.5 MG PO TABS: 2.5000 mg | ORAL_TABLET | Freq: Two times a day (BID) | ORAL | 1 refills | Status: DC

## 2017-02-12 MED ORDER — BUPROPION HCL ER (SR) 100 MG PO TB12: 100.0000 mg | ORAL_TABLET | Freq: Two times a day (BID) | ORAL | 0 refills | Status: DC

## 2017-02-12 MED ORDER — SENNOSIDES-DOCUSATE SODIUM 8.6-50 MG PO TABS: 2.0000 | ORAL_TABLET | Freq: Two times a day (BID) | ORAL | 5 refills | Status: DC

## 2017-02-12 NOTE — Patient Instructions (Addendum)
It was a pleasure to see you again [name].  I am glad you are feeling okay.  Please restart the medications we have refilled.  Please try your best to eat consistent meals to keep your energy up.  We are giving you the flu shot today.  Please follow up with me in 3 months or sooner if needed.

## 2017-02-12 NOTE — Progress Notes (Signed)
CC: Afib  HPI:  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  Patient states that he is actually feeling fairly well. He has been taking his pain medicine which controls his chronic pain, however says he has not taken any of his other medications in the last month other than senokot. He says he has no reason, but does prefer to continue taking necessary medications rather than continuing without further medical management.  Atrial Fibrillation: Patient with chronically difficult to control Afib. Currently prescribed Toprol XL 100 mg am and 50 mg pm and Digoxin 0.0625 mg once daily; neither of which he has taken in the last month. He has failed Amiodarone, Tikosyn, and Multaq in the past. Previously on Cardizem which was discontinued due to his low EF. He is taking Eliquis 2.5 mg BID for anticoagulation. He denies any chest pain, palpitations, or obvious bleeding. Recorded rate on arrival is incorrect at 49 bpm. He is tachycardic on exam to the 120-130s.  Hypotension: Has had recurrent orthostasis and hypotension most likely secondary to poor oral intake with contribution from his rate control medications. He did have improvement with the addition of Midodrine 5 mg TID with meals for pressure support. He has occasional lightheadedness, but no syncopal events.  Peripheral Vascular Disease: He has a significant PVD history s/p thrombectomies of arterial emboli in both legs. He is prescribed anticoagulation with Eliquis and antiplatelet therapy with low dose Aspirin, however has not taken these for the last month. He is also prescribed high-intensity Rosuvastatin 20 mg daily which he has not taken recently. He has chronic neuropathy in  his feet, right > left.  Depression: Patient has continued depression with occasional low mood, motivation, and appetite. He has been on Bupropion which he feels provides some benefit. He has also been on Mirtazapine which he does not feel is helpful.  Chronic Pain with long term current use of opiate analgesic: On chronic narcotics due to chronic back, hip, and knee pain. He takes Percocet 10-325 mg, 1 tablet every 4 hours as needed for pain, #150/month. He last renewed a pain contract with me in May 2018. Patient understands not to take his percocet if he has symptoms of lightheadedness, dizziness, shortness of breath, or confusion. He denies any constipation.  Healthcare Maintenance: Patient eligible for flu shot.  Past Medical History:  Diagnosis Date  . Alcohol abuse   . Anxiety   . Avascular necrosis of bones of both hips (HCC)    Status post bilateral hip replacements  . Bipolar disorder ([place])   . CHF (congestive heart failure) ([place])   . Chronic systolic heart failure ([place])    a. NICM - EF 35% with normal cors 2003. b. Last echo 11/2012 - EF 25-30%.  . CKD (chronic kidney disease)    Renal U/S 12/04/2009 showed no pathological findings. Labs 12/04/2009 include normal ESR, C3, C4; neg ANA; SPEP showed nonspecific increase in the alpha-2 region with no M-spike; UPEP showed no monoclonal free light chains; urine IFE showed polyclonal increase in feree Kappa and/or free Lambda light chains. Baseline Cr reported 1.7-2.5.  [name] COPD (chronic obstructive pulmonary disease) ([place])   . Depression   . DVT (deep venous thrombosis) ([place])    He has hypercoagulability with multiple prior  DVTs  . E coli bacteremia   . Erectile dysfunction   . Failure to thrive (0-17) 08/2016   DEHYDRATION  . Gastritis   . GERD (gastroesophageal reflux disease)   . HCAP (healthcare-associated pneumonia) 08/24/2015  . Hydrocele, unspecified   . Hyperlipemia   . Hypertension   . 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  Thyroid scan 08/28/2009 was normal with no focal areas of abnormal increased or decreased activity seen; the uptake of I 131 sodium iodide at 24 hours was 5.7%.  TSH and free T4 normalized by 08/16/2009.  . Intestinal obstruction ([place])   . Lung nodule    Chest CT scan on 12/14/2008 showed a nodular opacity at the left lung base felt to most likely represent scarring.  Follow-up chest CT scan on 06/02/2010 showed parenchymal scarring in the left apex, left  lower lobe, and lingula; some of this scarring at the left base had a nodular appearance, unchanged. Chest 09/2012 - stable.  . Noncompliance   . On home oxygen therapy    "3L periodically" (08/24/2015)  . Osteoarthritis   . Pleural plaque    H/o asbestos exposure. Chest CT on 06/02/2010 showed stable extensive calcified pleural plaques involving the left hemithorax, consistent with asbestos related pleural disease.  . Portal vein thrombosis   . Protein calorie malnutrition ([place]) 04/2016  . Spondylosis   . STEMI (ST elevation myocardial infarction) ([place]) 10/11/2014  . Tachycardia-bradycardia syndrome [facility])    a. 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 b. [employer] gen change 2010.  [name] Thrombocytopenia ([place])   . 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  . Weight loss    Normal colonoscopy by Dr. Olevia Perches on 11/06/2010.   Review of Systems:   Review of Systems  Constitutional: Positive for malaise/fatigue. Negative for chills, fever and weight loss.  HENT: Negative for nosebleeds.   Respiratory: Negative for hemoptysis and shortness of breath.   Cardiovascular: Negative for chest pain, palpitations and leg swelling.  Gastrointestinal: Negative for abdominal pain and constipation.  Musculoskeletal: Positive for back pain and joint pain.  Psychiatric/Behavioral: Positive for depression.      Physical Exam:  Vitals:   02/12/17 1526  BP: 103/68  Pulse: (!) 49  Temp: 97.8 F (36.6 C)  TempSrc: Oral  SpO2: 100%  Weight: 135 lb 14.4 oz (61.6 kg)  Height: '6\' 3"'  (1.905 m)   Physical Exam  Constitutional: He is oriented to person, place, and time. No distress.  Thin chronically ill appearing man.  HENT:  Head: Normocephalic and atraumatic.  Cardiovascular:  Tachycardic to 120-130s, irregularly irregular. DP +2 left foot, DP pulse diminished right foot.  Pulmonary/Chest: Effort normal. No respiratory distress. He has no wheezes. He has no rales.  Neurological: He is alert and oriented to person, place, and time.  Skin: Skin is warm. He is not diaphoretic.  Psychiatric: He has a normal mood and affect.    Assessment & Plan:   See Encounters Tab for problem based charting.  Patient discussed with Dr. Dareen Piano  Longstanding persistent atrial fibrillation [facility]) Patient with long-standing difficult to control atrial fibrillation. We restart Toprol XL for rate control and renal dosed Eliquis for anticoagulation. - Toprol XL 100 mg am and 50 mg pm - Eliquis 2.5 mg BID  Hypotension Secondary to poor oral intake. Less likely to be due to Metoprolol as he has not  taken it recently. He is encouraged to stay adequately hydrated. Will restart Midodrine 5 mg TIDAC.  PVD (peripheral vascular disease) ([place]) Patient with severe PVD s/p bilateral thrombectomies for prior arterial emboli. Will restart anticoagulation, antiplatelet, and high-intensity statin therapy. - Eliquis 2.5 mg BID - ASA 81 mg daily - Rosuvastatin 20 mg daily  Major depressive disorder, recurrent episode ([place]) Patient with continued depression, off prior medications. PHQ-9 not obtained this visit. Will restart Bupropion and discontinue Mirtazapine as this did not seem to provide any benefit to depressive mood or appetite. - Bupropion 100 mg BID - PHQ-9 on f/u  Long term current use of opiate  analgesic TIBERIUS LOFTUS is on chronic opioid therapy for chronic pain. The date of the controlled substances contract is referenced in the [place] and / or the overview. Date of pain contract was 10/17/15. As part of the treatment plan, the [place] controlled substance database is checked at least twice yearly and the database results are appropriate. I have last reviewed the results on 10/16/16.   The last UDS was on 10/16/16 and results are as expected. Patient needs at least a yearly UDS.   The patient is on Percocet strength 10-325 mg q4h prn, #150 per 30 days. This regimen allows SATORU MILICH to function and does not cause excessive sedation or other side effects.  "The benefits of continuing opioid therapy outweigh the risks and chronic opioids will be continued. Ongoing education about safe opioid treatment is provided  Interventions today include: No changes this visit. Continue scheduled Senokot and as needed Miralax for bowel regimen.  Healthcare maintenance Flu shot given this visit.  Need for immunization against influenza Flu shot given this visit.

## 2017-02-14 ENCOUNTER — Telehealth: Payer: Self-pay | Admitting: *Deleted

## 2017-02-14 DIAGNOSIS — Z23 Encounter for immunization: Secondary | ICD-10-CM | POA: Insufficient documentation

## 2017-02-14 NOTE — Assessment & Plan Note (Signed)
Flu shot given this visit. 

## 2017-02-14 NOTE — Assessment & Plan Note (Signed)
Patient with continued depression, off prior medications. PHQ-9 not obtained this visit. Will restart Bupropion and discontinue Mirtazapine as this did not seem to provide any benefit to depressive mood or appetite. - Bupropion 100 mg BID - PHQ-9 on f/u

## 2017-02-14 NOTE — Assessment & Plan Note (Signed)
Secondary to poor oral intake. Less likely to be due to Metoprolol as he has not taken it recently. He is encouraged to stay adequately hydrated. Will restart Midodrine 5 mg TIDAC.

## 2017-02-14 NOTE — Assessment & Plan Note (Addendum)
[name] is on chronic opioid therapy for chronic pain. The date of the controlled substances contract is referenced in the [place] and / or the overview. Date of pain contract was 10/17/15. As part of the treatment plan, the [place] controlled substance database is checked at least twice yearly and the database results are appropriate. I have last reviewed the results on 10/16/16.   The last UDS was on 10/16/16 and results are as expected. Patient needs at least a yearly UDS.   The patient is on Percocet strength 10-325 mg q4h prn, #150 per 30 days. This regimen allows [name] to function and does not cause excessive sedation or other side effects.  "The benefits of continuing opioid therapy outweigh the risks and chronic opioids will be continued. Ongoing education about safe opioid treatment is provided  Interventions today include: No changes this visit. Continue scheduled Senokot and as needed Miralax for bowel regimen.

## 2017-02-14 NOTE — Assessment & Plan Note (Signed)
Patient with long-standing difficult to control atrial fibrillation. We restart Toprol XL for rate control and renal dosed Eliquis for anticoagulation. - Toprol XL 100 mg am and 50 mg pm - Eliquis 2.5 mg BID

## 2017-02-14 NOTE — Telephone Encounter (Signed)
Message from [place] that Metroprol 50 mg tablets 2 in the morning and 1 in the evening is not covered by patients Insurance.  Tablet limitation to 2 per day.  Spoke with  Dr. [name] who s[oke to Pharmacy medication dosing to be changed to 100 mg tablets 1 in the am and 1/2 tablet in the evening.  Patient was called and informed of change.  Voiced understanding of the change.  Sander Nephew, RN 02/14/2017 4:53 pm.

## 2017-02-14 NOTE — Assessment & Plan Note (Signed)
Patient with severe PVD s/p bilateral thrombectomies for prior arterial emboli. Will restart anticoagulation, antiplatelet, and high-intensity statin therapy. - Eliquis 2.5 mg BID - ASA 81 mg daily - Rosuvastatin 20 mg daily

## 2017-02-18 NOTE — Progress Notes (Signed)
Internal Medicine Clinic Attending  Case discussed with Dr. Patel soon after the resident saw the patient.  We reviewed the resident's history and exam and pertinent patient test results.  I agree with the assessment, diagnosis, and plan of care documented in the resident's note. 

## 2017-03-03 ENCOUNTER — Other Ambulatory Visit: Payer: Self-pay

## 2017-03-03 DIAGNOSIS — Z79891 Long term (current) use of opiate analgesic: Secondary | ICD-10-CM

## 2017-03-03 NOTE — Telephone Encounter (Signed)
oxyCODONE-acetaminophen (PERCOCET) 10-325 MG tablet  Refill request.

## 2017-03-04 ENCOUNTER — Other Ambulatory Visit: Payer: Self-pay | Admitting: *Deleted

## 2017-03-04 ENCOUNTER — Telehealth: Payer: Self-pay | Admitting: Internal Medicine

## 2017-03-04 DIAGNOSIS — Z79891 Long term (current) use of opiate analgesic: Secondary | ICD-10-CM

## 2017-03-04 NOTE — Telephone Encounter (Signed)
Patient is requesting to speak with [name], regarding his medicine and he has another matter he would like to discuss with her also

## 2017-03-05 MED ORDER — OXYCODONE-ACETAMINOPHEN 10-325 MG PO TABS: 1.0000 | ORAL_TABLET | ORAL | 0 refills | Status: DC | PRN

## 2017-03-06 NOTE — Telephone Encounter (Signed)
Pt informed

## 2017-03-06 NOTE — Telephone Encounter (Signed)
Spoke w/ pt, sent request, pt has picked script up

## 2017-03-07 IMAGING — US US RENAL
1 series · 14 of 25 positions shown · non-contrast
Comparison: 10/13/2014

CLINICAL DATA: Acute renal injury

EXAM:
RENAL / URINARY TRACT ULTRASOUND COMPLETE

[Series 1: us renal · 0.23mm/px · 14 of 29 slices shown]
[im 1/29]
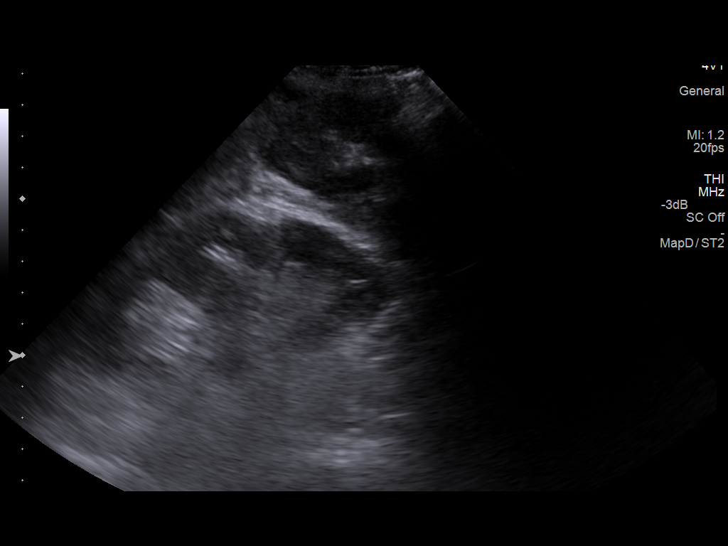
[im 3/29]
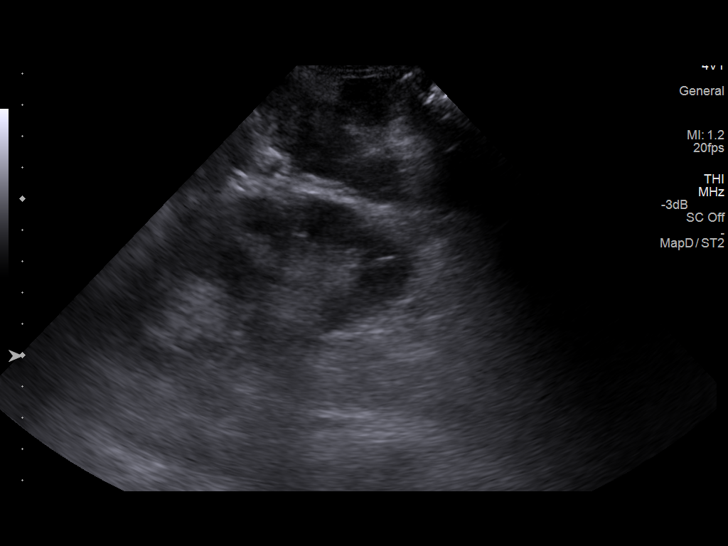
[im 5/29]
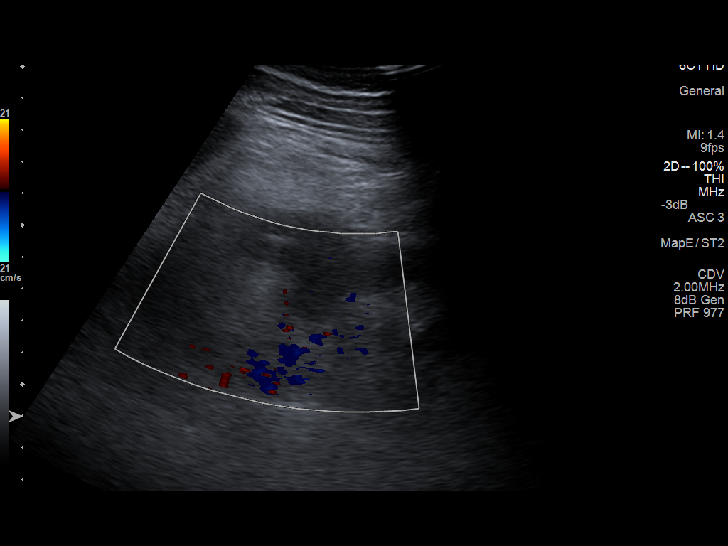
[im 8/29]
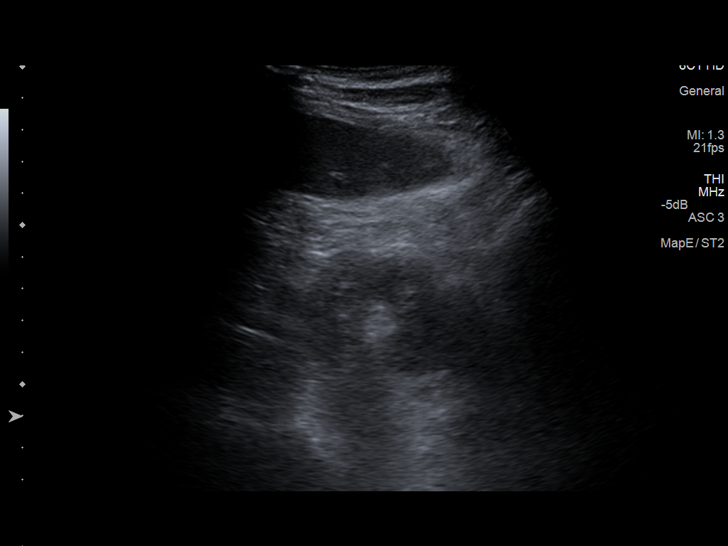
[im 10/29]
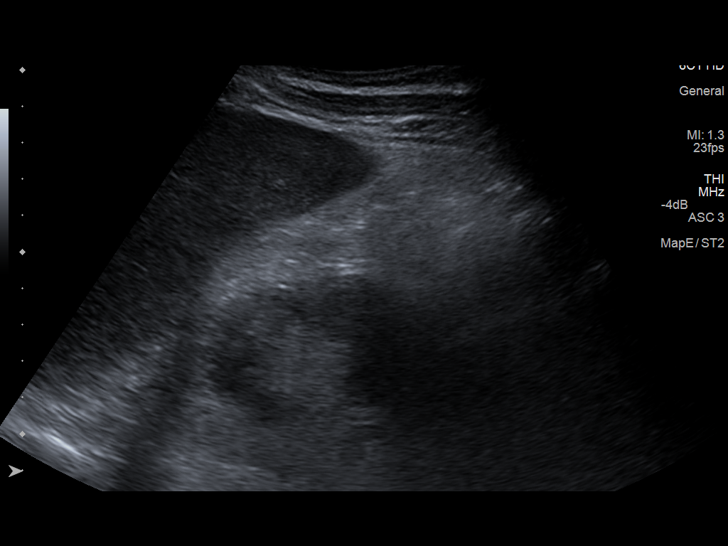
[im 11/29]
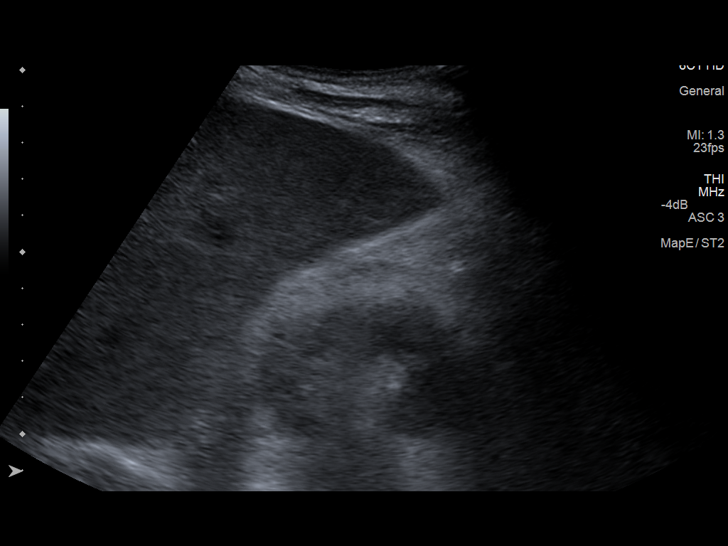
[im 13/29]
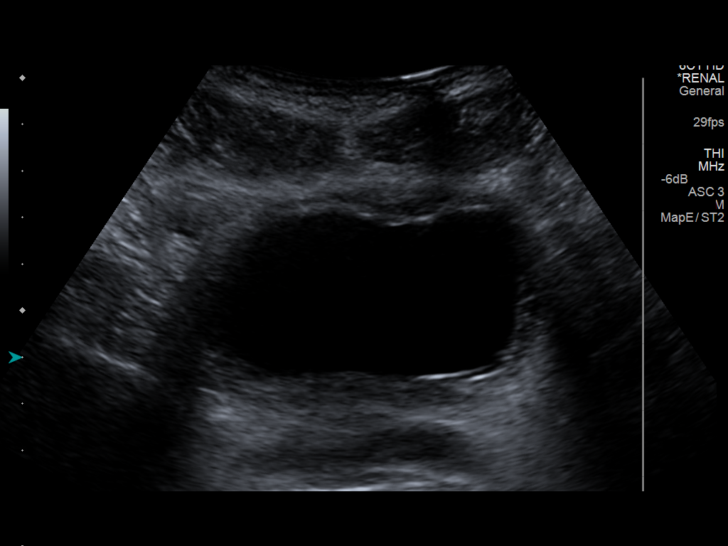
[im 16/29]
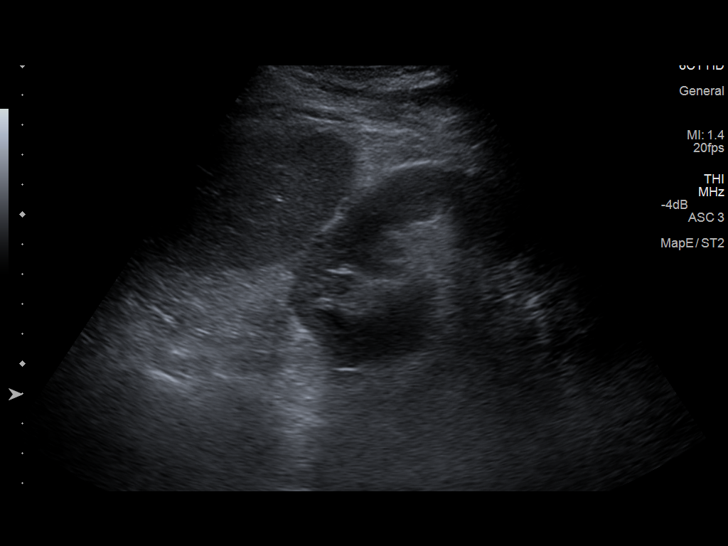
[im 18/29]
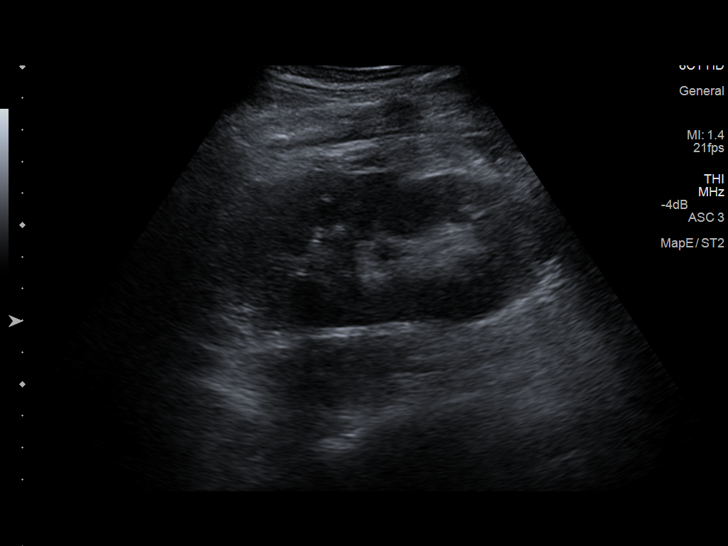
[im 19/29]
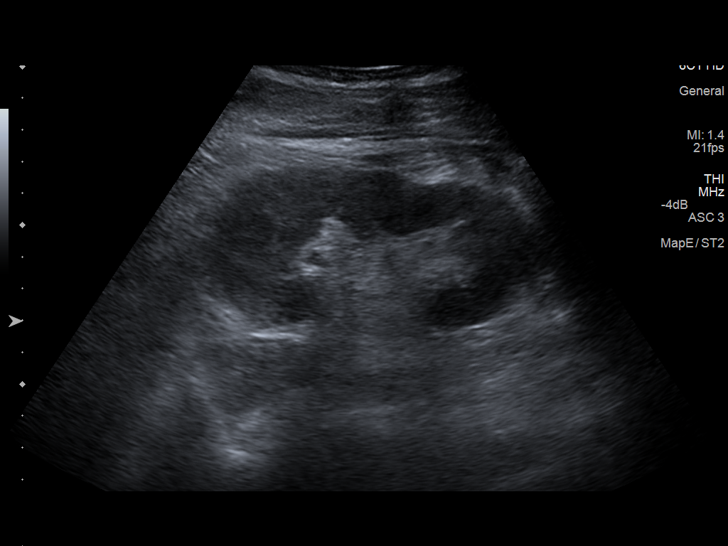
[im 22/29]
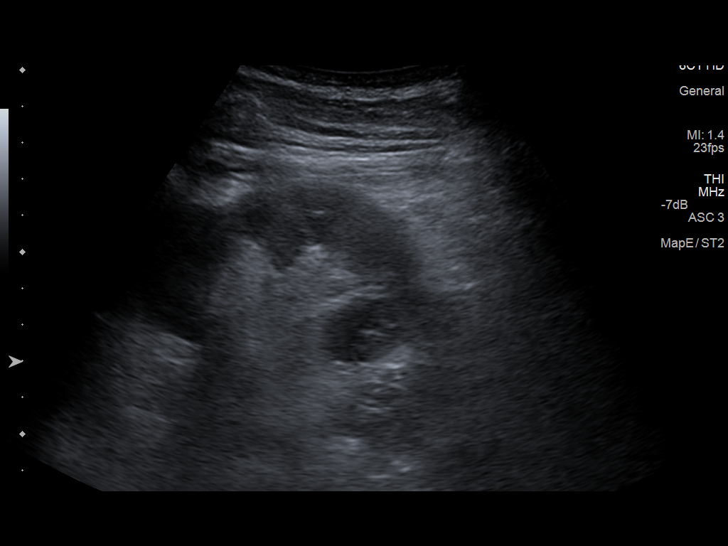
[im 24/29]
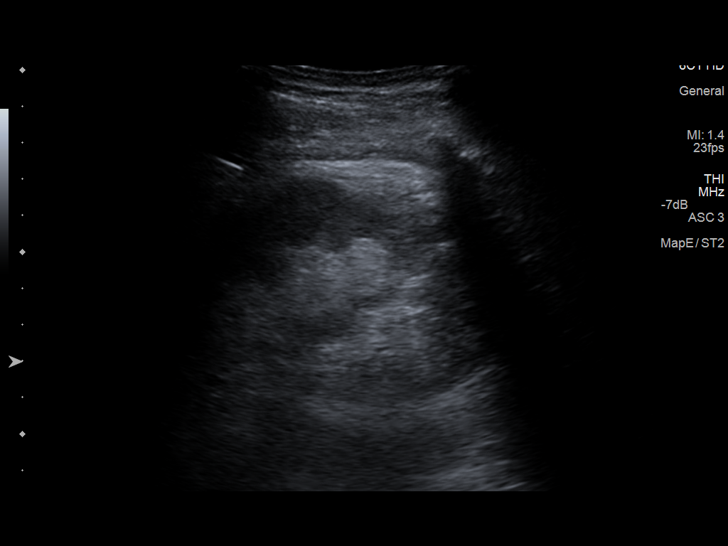
[im 26/29]
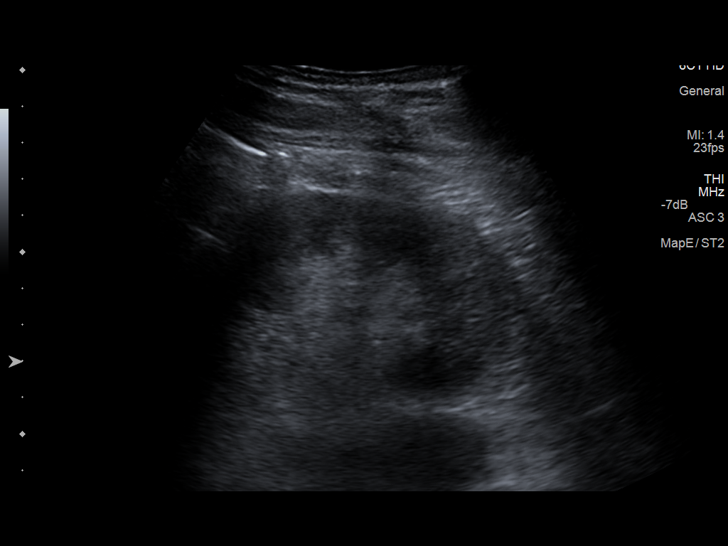
[im 29/29]
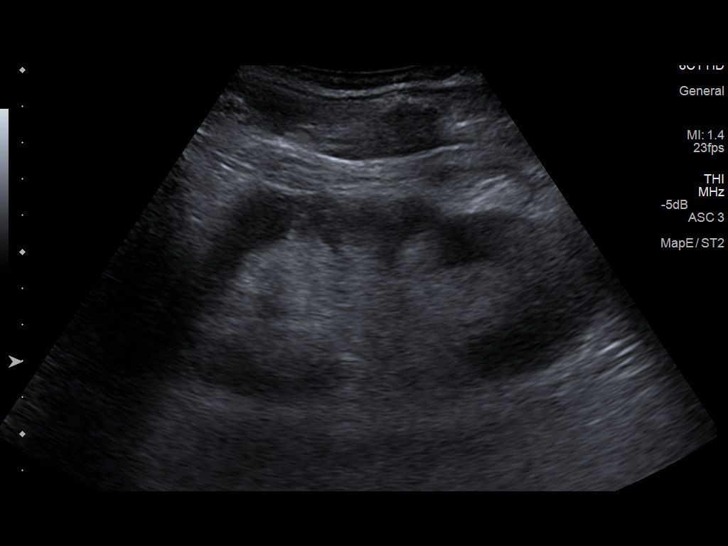

[14 of 25 positions shown; findings below may reference images not displayed]

FINDINGS: Right Kidney:

Length: 10.4 cm.. Echogenicity within normal limits. No mass or
hydronephrosis visualized.

Left Kidney:

Length: 10.8 cm.. Echogenicity within normal limits. No mass or
hydronephrosis visualized.

Bladder:

Mildly decompressed.  No filling defect is noted.
IMPRESSION: No acute abnormality noted.

## 2017-03-07 NOTE — Telephone Encounter (Signed)
Oxycodone was refilled on 03/05/17.

## 2017-03-08 IMAGING — DX DG CHEST 2V
2 series · 2 of 2 positions shown · non-contrast
Comparison: 10/19/2015.  Chest CT 10/02/2015

CLINICAL DATA: Shortness of breath, weakness for 3 weeks

EXAM:
CHEST  2 VIEW

[chest lat]
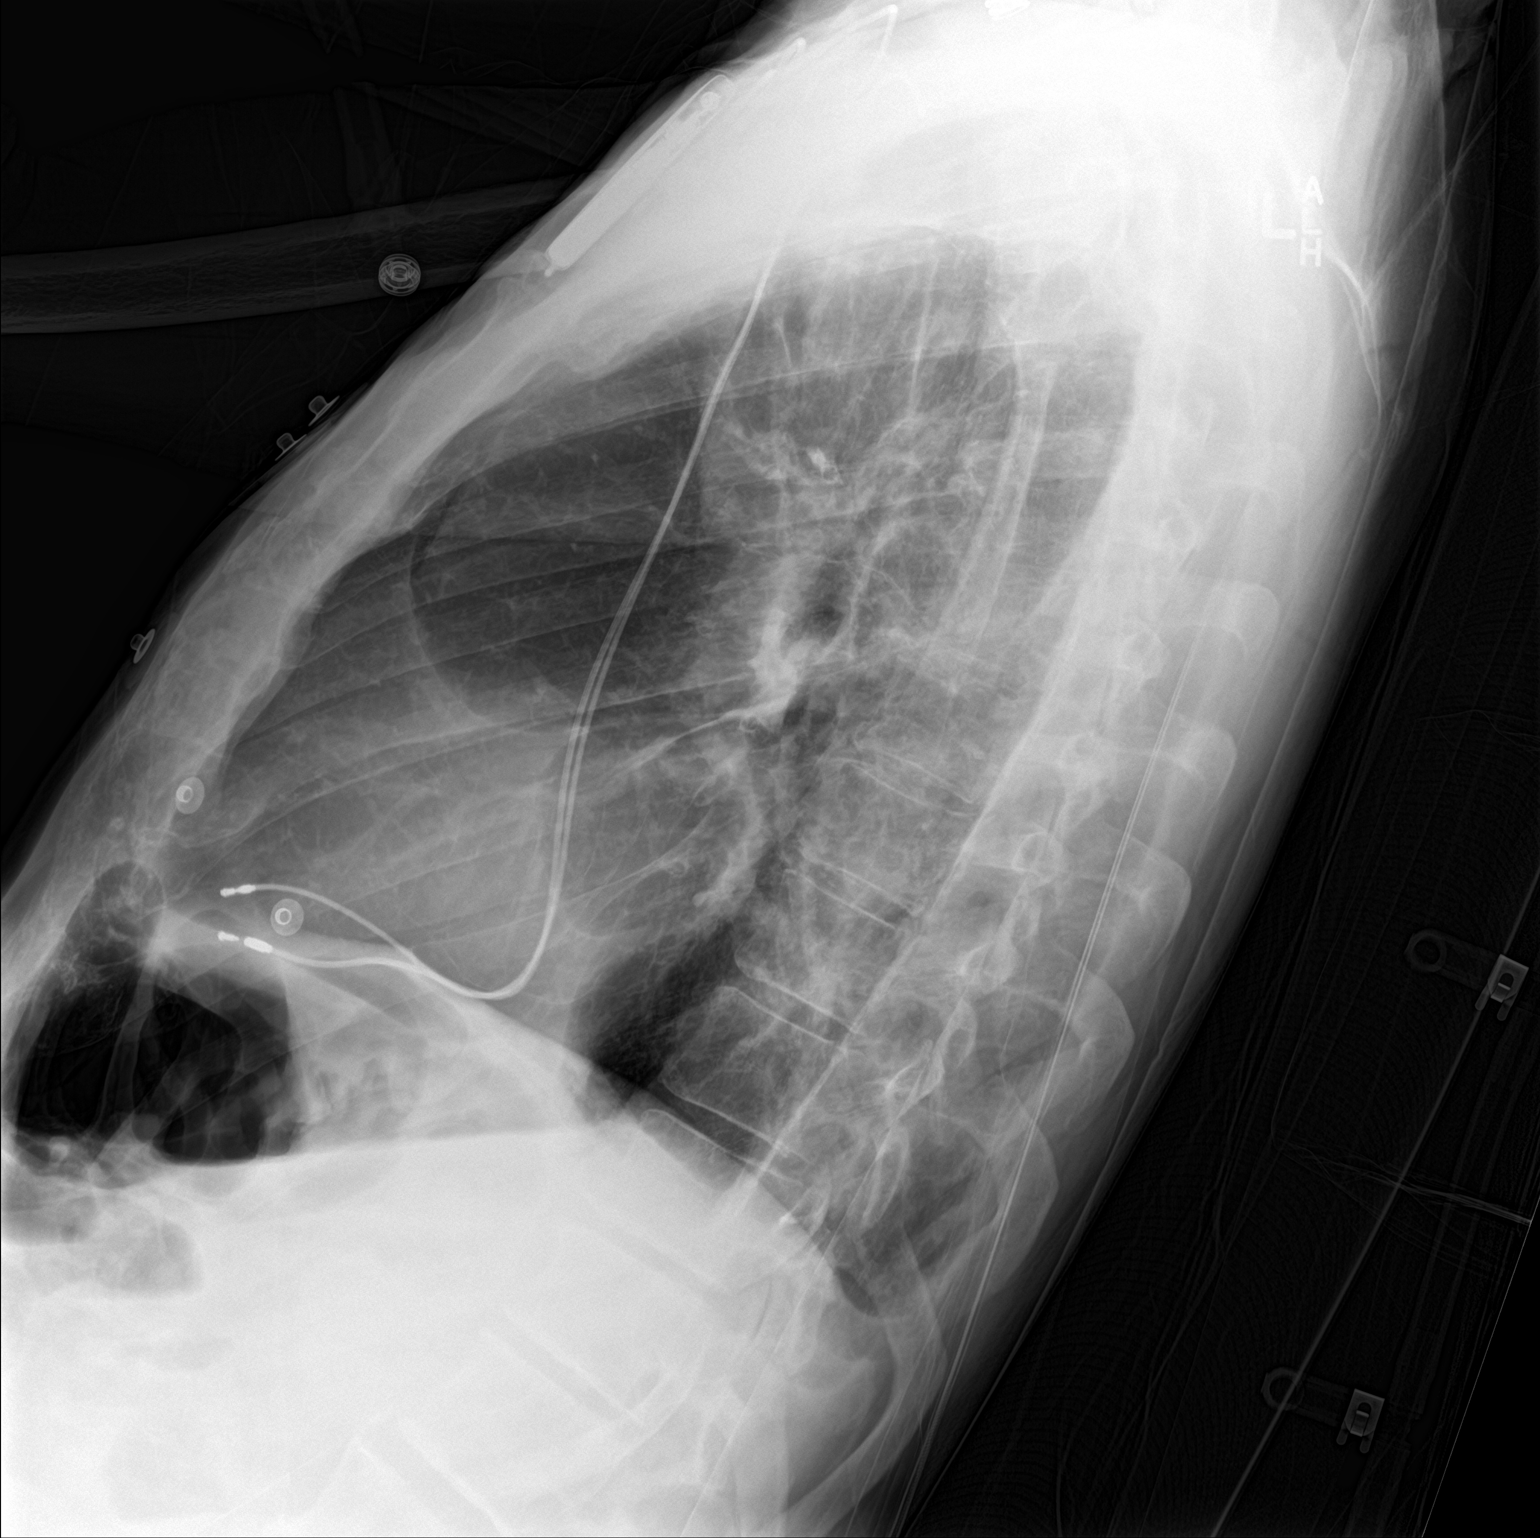

[chest ap]
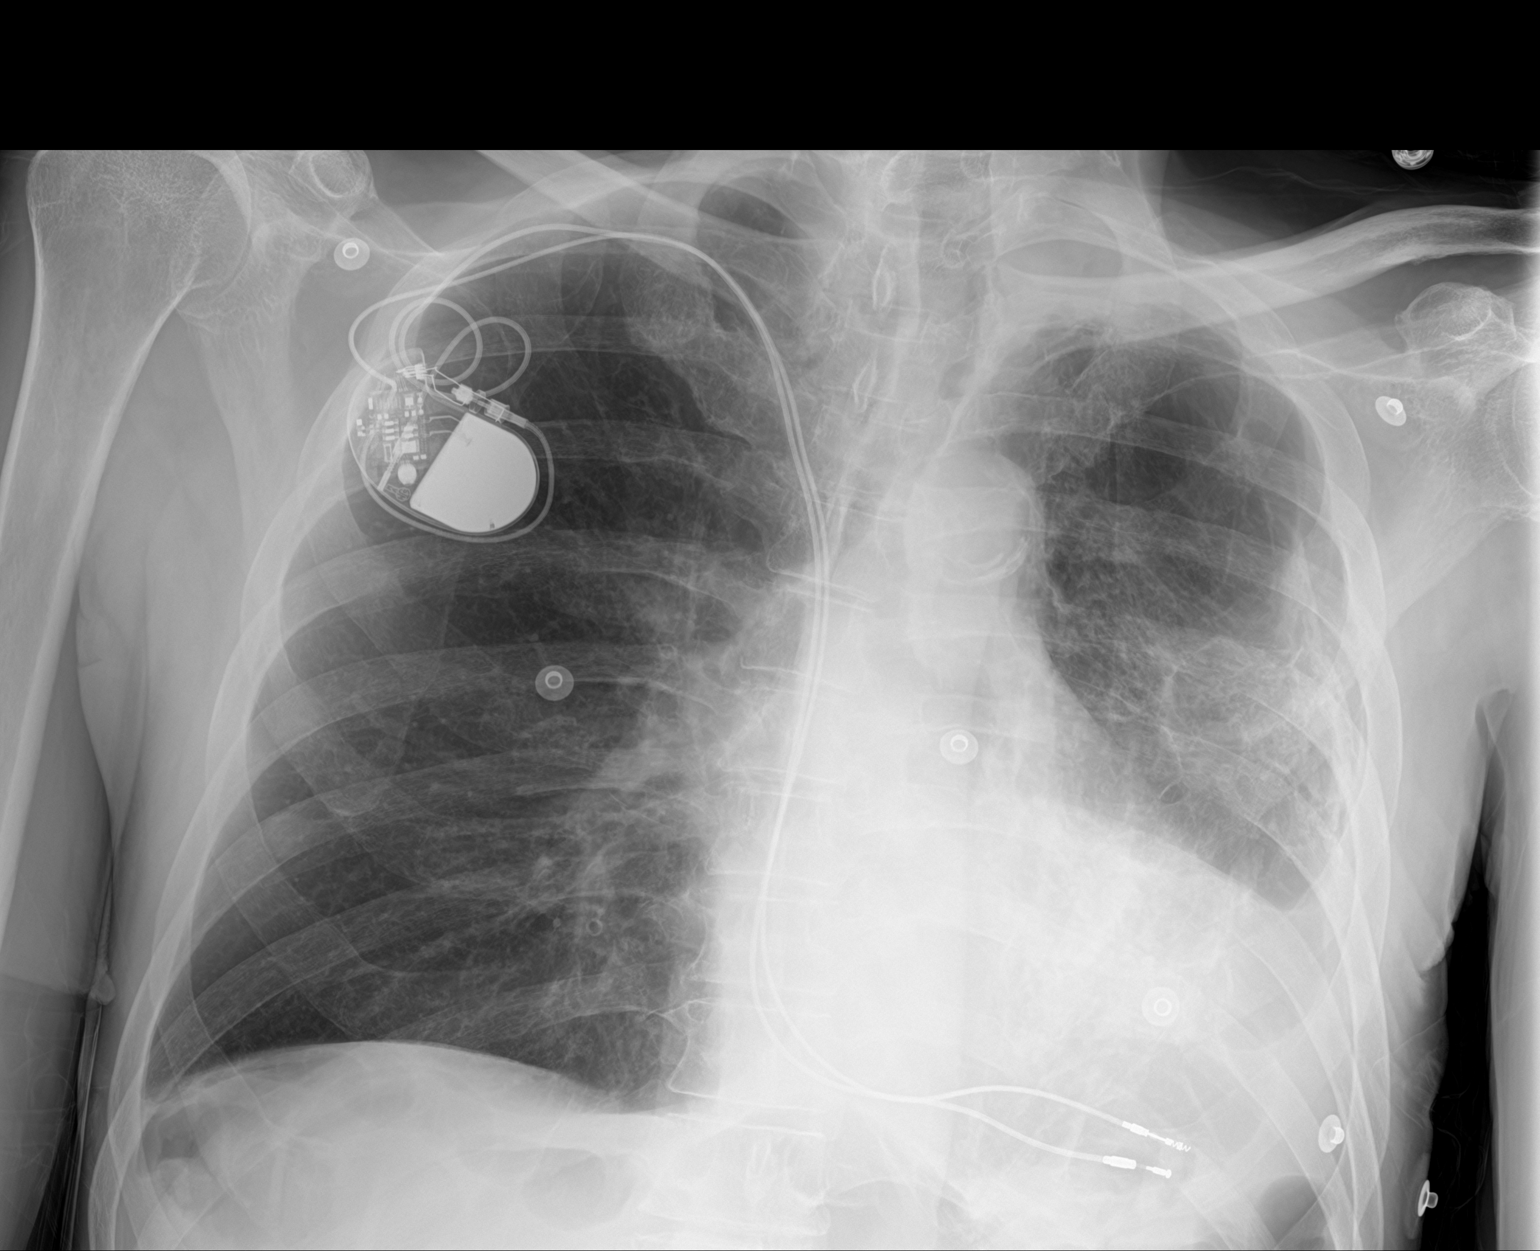

[2 of 2 positions shown; findings below may reference images not displayed]

FINDINGS: Moderate left pleural effusion versus pleural thickening on the
left. Chronic changes in the left lung I was scarring. No confluent
opacity on the right. Heart is upper limits normal in size. Right
pacer is unchanged.
IMPRESSION: Chronic changes on the left. Moderate left pleural effusion versus
pleural thickening.

## 2017-03-10 NOTE — Telephone Encounter (Signed)
See documentation.

## 2017-04-26 IMAGING — CT CT ABD-PELV W/O CM
2 of 4 series · 14 of 46 positions shown, 16 images · non-contrast
Comparison: PET-CT dated 11/05/2015. CT abdomen pelvis dated
10/13/2014.

CLINICAL DATA: Right lower quadrant pain, diarrhea, vomiting

EXAM:
CT ABDOMEN AND PELVIS WITHOUT CONTRAST
TECHNIQUE: Multidetector CT imaging of the abdomen and pelvis was performed
following the standard protocol without IV contrast.

[Series 2: a/p w/o 5mm · axial · non-contrast · 0.75mm/px · z∈[-356,+104]mm · 11 of 112 slices shown, 13 images]
[im 10/112  soft-tissue]
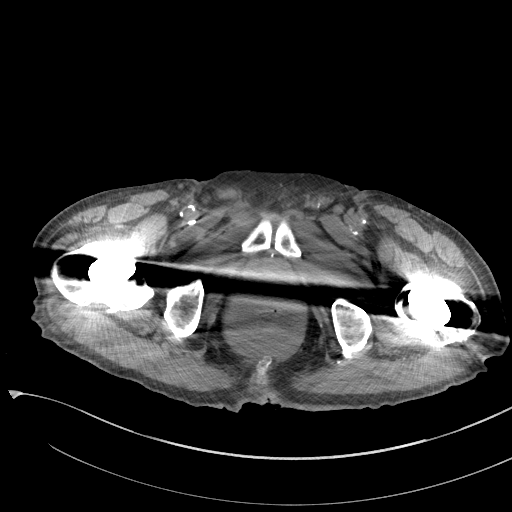
[im 10/112  bone]
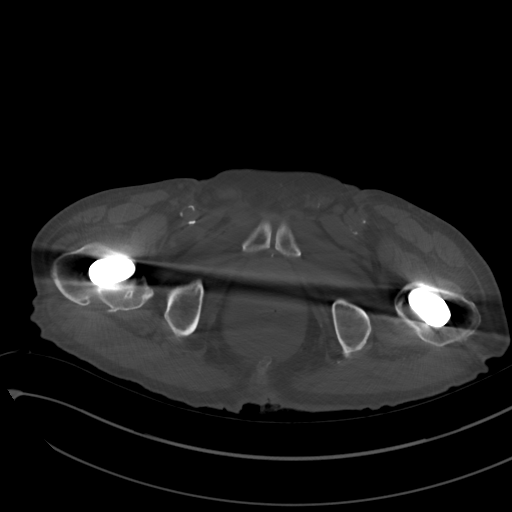
[im 19/112  soft-tissue]
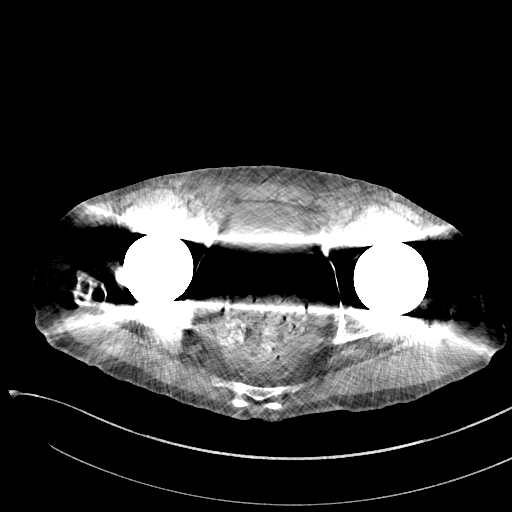
[im 28/112  soft-tissue]
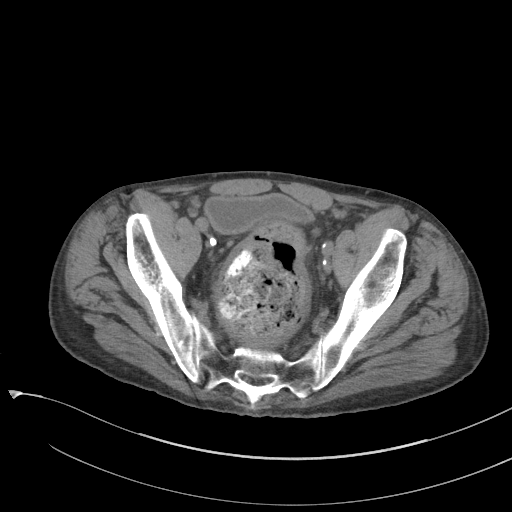
[im 38/112  soft-tissue]
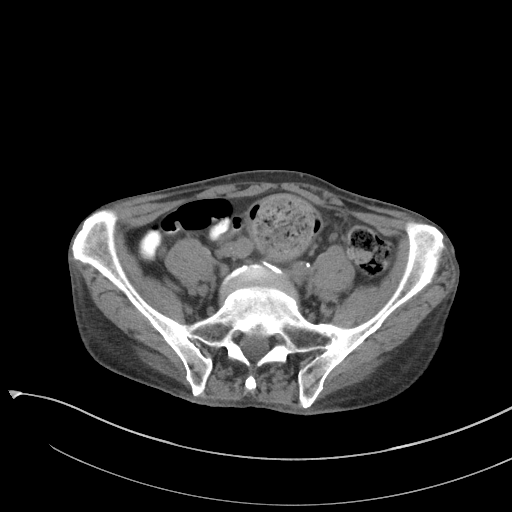
[im 47/112  soft-tissue]
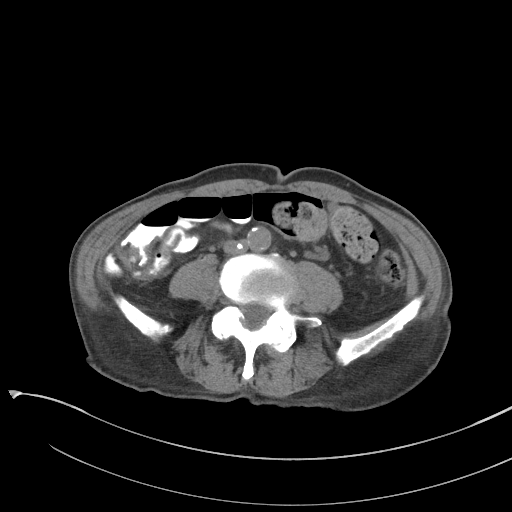
[im 56/112  soft-tissue]
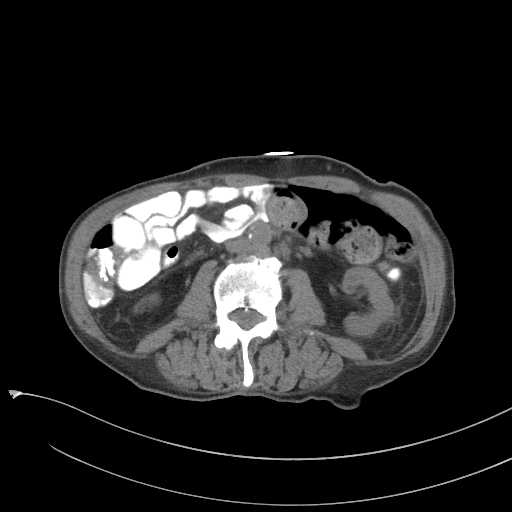
[im 65/112  soft-tissue]
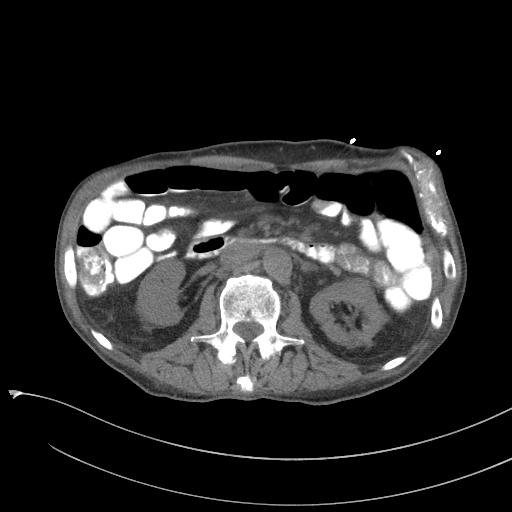
[im 75/112  soft-tissue]
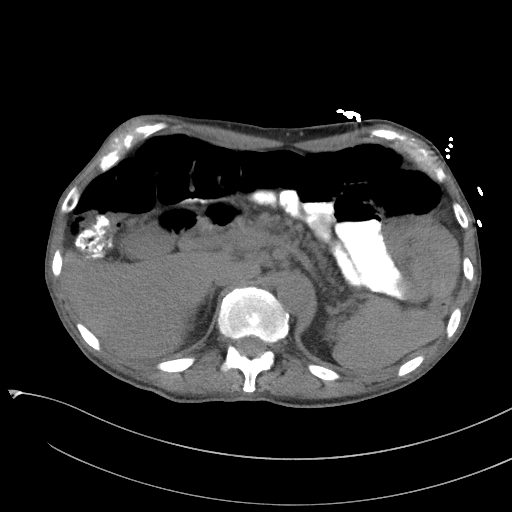
[im 84/112  soft-tissue]
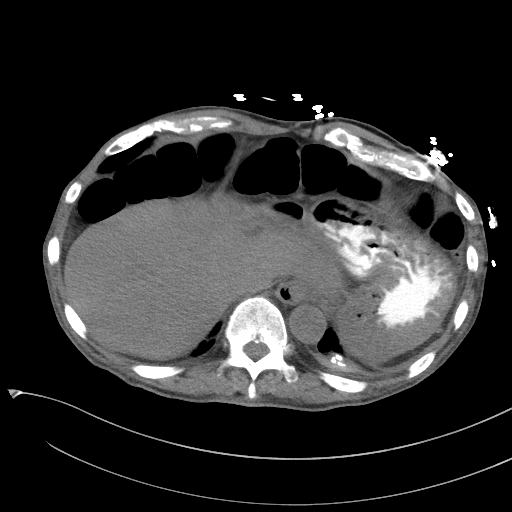
[im 84/112  bone]
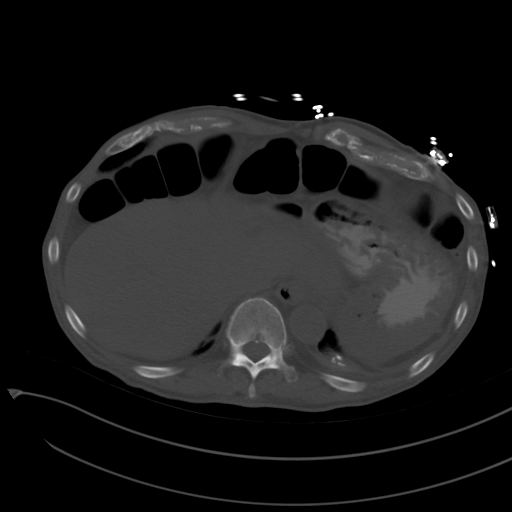
[im 93/112  soft-tissue]
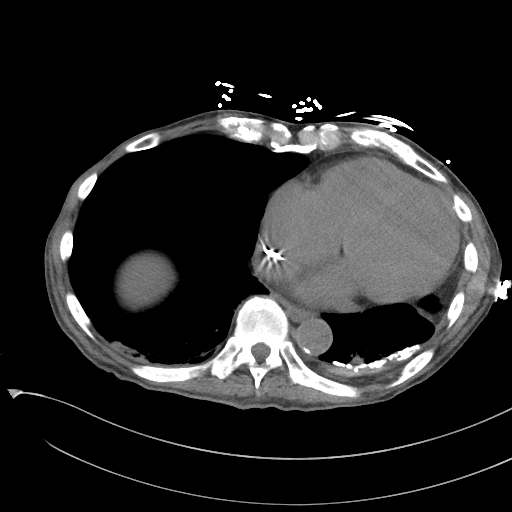
[im 102/112  soft-tissue]
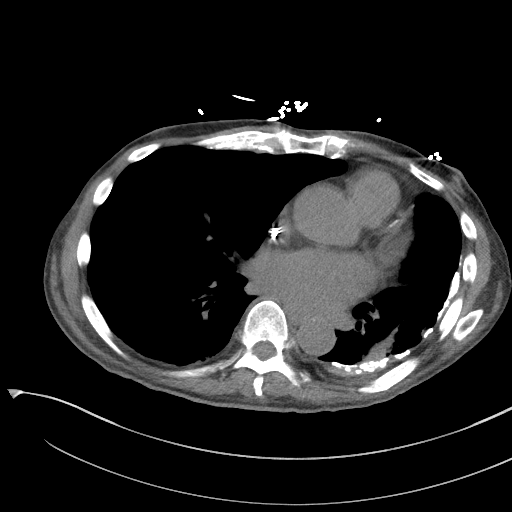

[Series 5: a/p w/o cor · coronal · non-contrast · 0.64mm/px · 3 of 110 slices shown]
[im 37/110  soft-tissue]
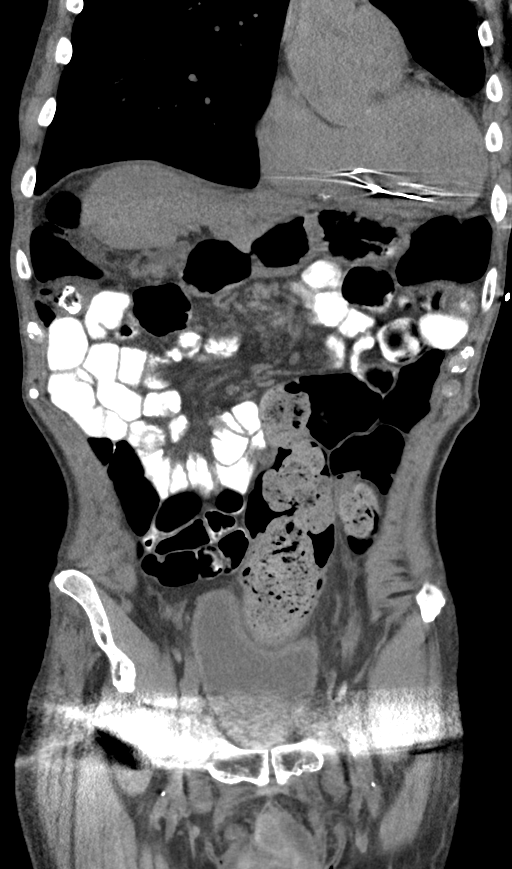
[im 49/110  soft-tissue]
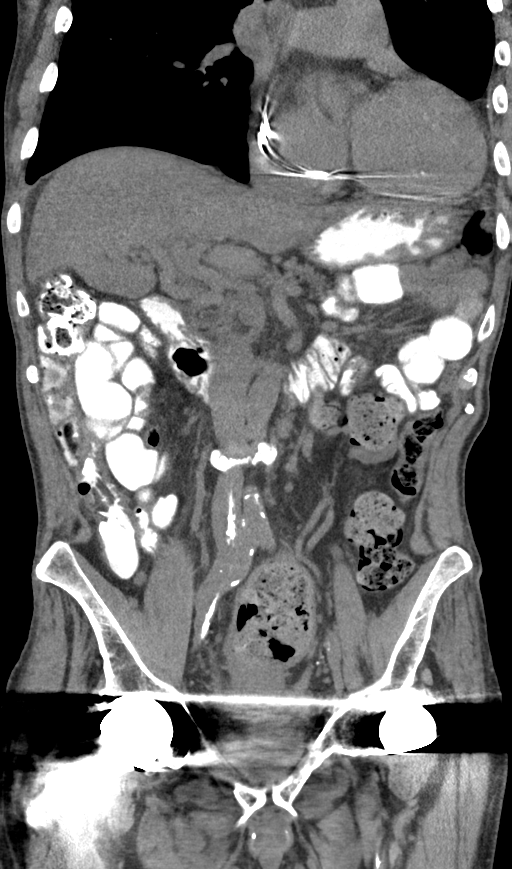
[im 61/110  soft-tissue]
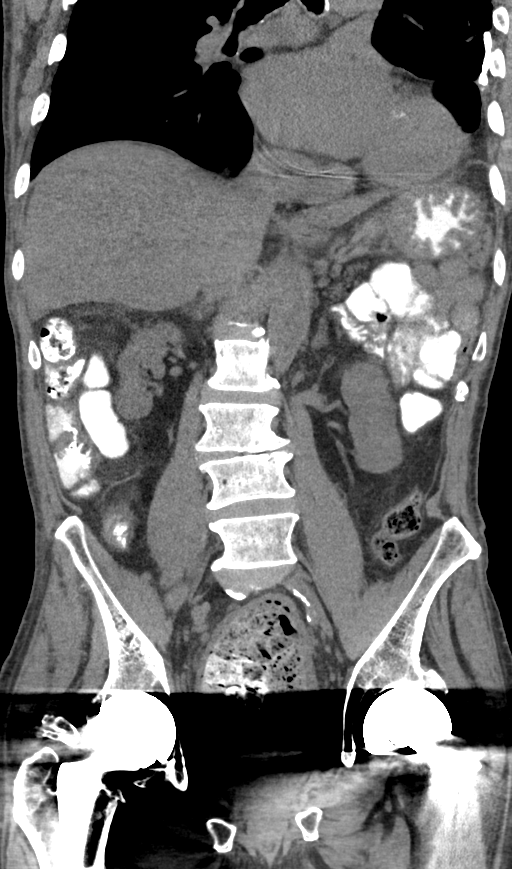

[14 of 46 positions shown; findings below may reference images not displayed]

FINDINGS: Lower chest: Trace bilateral pleural effusions. Calcified left
pleural plaques. Rounded subpleural opacities in the left lower lobe
(series 3/ images 6 and 14), chronic, favored to reflect rounded
atelectasis.

Associated volume loss with leftward cardiomediastinal shift. Mild
cardiomegaly. ICD leads, incompletely visualized.

Hepatobiliary: Unenhanced liver is unremarkable.

Gallbladder is notable for layering sludge (series 2/ image 36),
without associated inflammatory changes.

Pancreas: Fatty parenchymal atrophy.

Spleen: Within normal limits.

Adrenals/Urinary Tract: Adrenal glands are within normal limits.

Bilateral renal cortical scarring, right greater than left. No
renal, ureteral, or bladder calculi. No hydronephrosis.

Bladder is mildly thick-walled although underdistended.

Stomach/Bowel: Stomach is within normal limits.

No evidence of bowel obstruction.

Distal appendix is mildly prominent, measuring 9 mm (series 2/image
37), previously 8 mm. No associated inflammatory changes to suggest
acute appendicitis.

Moderate rectal stool burden, suggesting rectal impaction, with
associated wall thickening raising the possibility of stercoral
colitis (series 2/ image 89).

Vascular/Lymphatic: Atherosclerotic calcifications of the abdominal
aorta and branch vessels.

No evidence of abdominal aortic aneurysm.

No suspicious abdominopelvic lymphadenopathy.

Reproductive: Prostate is not discretely visualized/obscured by
streak artifact.

Other: No abdominopelvic ascites.

Musculoskeletal: Degenerative changes of the visualized
thoracolumbar spine.

Grade 1 spondylolisthesis at L5-S1.

Bilateral hip arthroplasties, in satisfactory position.
IMPRESSION: Moderate rectal stool burden, suggesting rectal impaction.
Associated rectal wall thickening raises the possibility of
stercoral colitis.

No evidence of bowel obstruction. Mildly prominent distal appendix,
without associated inflammatory changes to suggest acute
appendicitis.

Layering gallbladder sludge, without associated inflammatory
changes.

Additional ancillary findings as above.

## 2017-05-21 ENCOUNTER — Encounter: Payer: Medicare HMO | Admitting: Internal Medicine

## 2017-05-22 ENCOUNTER — Encounter (HOSPITAL_COMMUNITY): Payer: Self-pay | Admitting: Family Medicine

## 2017-05-22 ENCOUNTER — Telehealth: Payer: Self-pay | Admitting: Internal Medicine

## 2017-05-22 ENCOUNTER — Inpatient Hospital Stay (HOSPITAL_COMMUNITY)
Admission: EM | Admit: 2017-05-22 | Discharge: 2017-05-27 | DRG: 308 | Disposition: A | Discharge status: Home Health | Payer: Medicare Other | Attending: Nephrology | Admitting: Nephrology

## 2017-05-22 ENCOUNTER — Ambulatory Visit: Payer: Medicare Other

## 2017-05-22 ENCOUNTER — Emergency Department (HOSPITAL_COMMUNITY): Payer: Medicare Other

## 2017-05-22 DIAGNOSIS — I482 Chronic atrial fibrillation: Secondary | ICD-10-CM | POA: Diagnosis present

## 2017-05-22 DIAGNOSIS — I2510 Atherosclerotic heart disease of native coronary artery without angina pectoris: Secondary | ICD-10-CM | POA: Diagnosis present

## 2017-05-22 DIAGNOSIS — Z7901 Long term (current) use of anticoagulants: Secondary | ICD-10-CM

## 2017-05-22 DIAGNOSIS — E43 Unspecified severe protein-calorie malnutrition: Secondary | ICD-10-CM | POA: Diagnosis present

## 2017-05-22 DIAGNOSIS — I428 Other cardiomyopathies: Secondary | ICD-10-CM | POA: Diagnosis present

## 2017-05-22 DIAGNOSIS — D72829 Elevated white blood cell count, unspecified: Secondary | ICD-10-CM | POA: Diagnosis present

## 2017-05-22 DIAGNOSIS — F418 Other specified anxiety disorders: Secondary | ICD-10-CM | POA: Diagnosis present

## 2017-05-22 DIAGNOSIS — Z66 Do not resuscitate: Secondary | ICD-10-CM | POA: Diagnosis present

## 2017-05-22 DIAGNOSIS — Z9981 Dependence on supplemental oxygen: Secondary | ICD-10-CM | POA: Diagnosis not present

## 2017-05-22 DIAGNOSIS — I481 Persistent atrial fibrillation: Secondary | ICD-10-CM | POA: Diagnosis not present

## 2017-05-22 DIAGNOSIS — I252 Old myocardial infarction: Secondary | ICD-10-CM

## 2017-05-22 DIAGNOSIS — R402413 Glasgow coma scale score 13-15, at hospital admission: Secondary | ICD-10-CM | POA: Diagnosis present

## 2017-05-22 DIAGNOSIS — Z888 Allergy status to other drugs, medicaments and biological substances status: Secondary | ICD-10-CM

## 2017-05-22 DIAGNOSIS — I5022 Chronic systolic (congestive) heart failure: Secondary | ICD-10-CM | POA: Diagnosis not present

## 2017-05-22 DIAGNOSIS — J61 Pneumoconiosis due to asbestos and other mineral fibers: Secondary | ICD-10-CM | POA: Diagnosis present

## 2017-05-22 DIAGNOSIS — I130 Hypertensive heart and chronic kidney disease with heart failure and stage 1 through stage 4 chronic kidney disease, or unspecified chronic kidney disease: Secondary | ICD-10-CM | POA: Diagnosis present

## 2017-05-22 DIAGNOSIS — J9611 Chronic respiratory failure with hypoxia: Secondary | ICD-10-CM

## 2017-05-22 DIAGNOSIS — E785 Hyperlipidemia, unspecified: Secondary | ICD-10-CM | POA: Diagnosis present

## 2017-05-22 DIAGNOSIS — Z8673 Personal history of transient ischemic attack (TIA), and cerebral infarction without residual deficits: Secondary | ICD-10-CM

## 2017-05-22 DIAGNOSIS — Z96643 Presence of artificial hip joint, bilateral: Secondary | ICD-10-CM | POA: Diagnosis present

## 2017-05-22 DIAGNOSIS — N183 Chronic kidney disease, stage 3 unspecified: Secondary | ICD-10-CM | POA: Diagnosis present

## 2017-05-22 DIAGNOSIS — Z9114 Patient's other noncompliance with medication regimen: Secondary | ICD-10-CM

## 2017-05-22 DIAGNOSIS — R64 Cachexia: Secondary | ICD-10-CM | POA: Diagnosis present

## 2017-05-22 DIAGNOSIS — I4891 Unspecified atrial fibrillation: Secondary | ICD-10-CM | POA: Diagnosis present

## 2017-05-22 DIAGNOSIS — Z8639 Personal history of other endocrine, nutritional and metabolic disease: Secondary | ICD-10-CM

## 2017-05-22 DIAGNOSIS — I959 Hypotension, unspecified: Secondary | ICD-10-CM | POA: Diagnosis not present

## 2017-05-22 DIAGNOSIS — Z7189 Other specified counseling: Secondary | ICD-10-CM | POA: Diagnosis not present

## 2017-05-22 DIAGNOSIS — Z515 Encounter for palliative care: Secondary | ICD-10-CM | POA: Diagnosis present

## 2017-05-22 DIAGNOSIS — Z950 Presence of cardiac pacemaker: Secondary | ICD-10-CM

## 2017-05-22 DIAGNOSIS — Z79899 Other long term (current) drug therapy: Secondary | ICD-10-CM

## 2017-05-22 DIAGNOSIS — M1990 Unspecified osteoarthritis, unspecified site: Secondary | ICD-10-CM | POA: Diagnosis present

## 2017-05-22 DIAGNOSIS — J9621 Acute and chronic respiratory failure with hypoxia: Secondary | ICD-10-CM | POA: Diagnosis present

## 2017-05-22 DIAGNOSIS — F339 Major depressive disorder, recurrent, unspecified: Secondary | ICD-10-CM | POA: Diagnosis not present

## 2017-05-22 DIAGNOSIS — D6859 Other primary thrombophilia: Secondary | ICD-10-CM | POA: Diagnosis present

## 2017-05-22 DIAGNOSIS — Z77090 Contact with and (suspected) exposure to asbestos: Secondary | ICD-10-CM | POA: Diagnosis present

## 2017-05-22 DIAGNOSIS — Z9119 Patient's noncompliance with other medical treatment and regimen: Secondary | ICD-10-CM

## 2017-05-22 DIAGNOSIS — J441 Chronic obstructive pulmonary disease with (acute) exacerbation: Secondary | ICD-10-CM | POA: Diagnosis present

## 2017-05-22 DIAGNOSIS — R079 Chest pain, unspecified: Secondary | ICD-10-CM | POA: Diagnosis not present

## 2017-05-22 DIAGNOSIS — F17210 Nicotine dependence, cigarettes, uncomplicated: Secondary | ICD-10-CM | POA: Diagnosis present

## 2017-05-22 DIAGNOSIS — Z681 Body mass index (BMI) 19 or less, adult: Secondary | ICD-10-CM

## 2017-05-22 DIAGNOSIS — G8929 Other chronic pain: Secondary | ICD-10-CM | POA: Diagnosis present

## 2017-05-22 DIAGNOSIS — Z86718 Personal history of other venous thrombosis and embolism: Secondary | ICD-10-CM

## 2017-05-22 DIAGNOSIS — Z880 Allergy status to penicillin: Secondary | ICD-10-CM

## 2017-05-22 DIAGNOSIS — Z7982 Long term (current) use of aspirin: Secondary | ICD-10-CM

## 2017-05-22 DIAGNOSIS — R911 Solitary pulmonary nodule: Secondary | ICD-10-CM | POA: Diagnosis present

## 2017-05-22 HISTORY — DX: Chronic kidney disease, stage 3 (moderate): N18.3

## 2017-05-22 HISTORY — DX: Permanent atrial fibrillation: I48.21

## 2017-05-22 HISTORY — DX: Chronic kidney disease, stage 3 unspecified: N18.30

## 2017-05-22 HISTORY — DX: Other cardiomyopathies: I42.8

## 2017-05-22 HISTORY — DX: Contact with and (suspected) exposure to asbestos: Z77.090

## 2017-05-22 LAB — COMPREHENSIVE METABOLIC PANEL
ALBUMIN: 4 g/dL (ref 3.5–5.0)
ALT: 11 U/L — ABNORMAL LOW (ref 17–63)
ANION GAP: 8 (ref 5–15)
AST: 21 U/L (ref 15–41)
Alkaline Phosphatase: 72 U/L (ref 38–126)
BUN: 35 mg/dL — AB (ref 6–20)
CHLORIDE: 111 mmol/L (ref 101–111)
CO2: 23 mmol/L (ref 22–32)
Calcium: 9.2 mg/dL (ref 8.9–10.3)
Creatinine, Ser: 1.38 mg/dL — ABNORMAL HIGH (ref 0.61–1.24)
GFR calc Af Amer: 57 mL/min — ABNORMAL LOW (ref >60)
GFR, EST NON AFRICAN AMERICAN: 49 mL/min — AB (ref >60)
Glucose, Bld: 88 mg/dL (ref 65–99)
POTASSIUM: 4.6 mmol/L (ref 3.5–5.1)
Sodium: 142 mmol/L (ref 135–145)
Total Bilirubin: 1.5 mg/dL — ABNORMAL HIGH (ref 0.3–1.2)
Total Protein: 7.2 g/dL (ref 6.5–8.1)

## 2017-05-22 LAB — CBC WITH DIFFERENTIAL/PLATELET
BASOS ABS: 0 10*3/uL (ref 0.0–0.1)
Basophils Relative: 0 %
EOS ABS: 0.1 10*3/uL (ref 0.0–0.7)
EOS PCT: 1 %
HCT: 49.3 % (ref 39.0–52.0)
Hemoglobin: 16.9 g/dL (ref 13.0–17.0)
Lymphocytes Relative: 18 %
Lymphs Abs: 2.1 10*3/uL (ref 0.7–4.0)
MCH: 31.3 pg (ref 26.0–34.0)
MCHC: 34.3 g/dL (ref 30.0–36.0)
MCV: 91.3 fL (ref 78.0–100.0)
Monocytes Absolute: 1.2 10*3/uL — ABNORMAL HIGH (ref 0.1–1.0)
Monocytes Relative: 10 %
Neutro Abs: 8.1 10*3/uL — ABNORMAL HIGH (ref 1.7–7.7)
Neutrophils Relative %: 71 %
PLATELETS: 154 10*3/uL (ref 150–400)
RBC: 5.4 MIL/uL (ref 4.22–5.81)
RDW: 15 % (ref 11.5–15.5)
WBC: 11.5 10*3/uL — AB (ref 4.0–10.5)

## 2017-05-22 LAB — BRAIN NATRIURETIC PEPTIDE: B NATRIURETIC PEPTIDE 5: 122.7 pg/mL — AB (ref 0.0–100.0)

## 2017-05-22 MED ORDER — METOPROLOL TARTRATE 25 MG PO TABS
50.0000 mg | ORAL_TABLET | Freq: Once | ORAL | Status: AC
  Administered 2017-05-22: 50 mg via ORAL
  Filled 2017-05-22: qty 2

## 2017-05-22 MED ORDER — DILTIAZEM HCL-DEXTROSE 100-5 MG/100ML-% IV SOLN (PREMIX)
5.0000 mg/h | INTRAVENOUS | Status: DC
  Administered 2017-05-23 – 2017-05-24 (×3): 5 mg/h via INTRAVENOUS
  Filled 2017-05-22 (×3): qty 100

## 2017-05-22 MED ORDER — METOPROLOL SUCCINATE ER 25 MG PO TB24
100.0000 mg | ORAL_TABLET | Freq: Every day | ORAL | Status: DC
  Administered 2017-05-23 – 2017-05-24 (×2): 100 mg via ORAL
  Filled 2017-05-22: qty 1
  Filled 2017-05-22: qty 4

## 2017-05-22 MED ORDER — METHYLPREDNISOLONE SODIUM SUCC 125 MG IJ SOLR
60.0000 mg | Freq: Four times a day (QID) | INTRAMUSCULAR | Status: DC
  Administered 2017-05-23 (×3): 60 mg via INTRAVENOUS
  Filled 2017-05-22 (×3): qty 2

## 2017-05-22 MED ORDER — BUPROPION HCL ER (SR) 100 MG PO TB12
100.0000 mg | ORAL_TABLET | Freq: Two times a day (BID) | ORAL | Status: DC
  Administered 2017-05-23 – 2017-05-27 (×9): 100 mg via ORAL
  Filled 2017-05-22 (×10): qty 1

## 2017-05-22 MED ORDER — ROSUVASTATIN CALCIUM 20 MG PO TABS
20.0000 mg | ORAL_TABLET | Freq: Every day | ORAL | Status: DC
  Administered 2017-05-23 – 2017-05-26 (×4): 20 mg via ORAL
  Filled 2017-05-22 (×5): qty 1

## 2017-05-22 MED ORDER — ASPIRIN 81 MG PO CHEW
81.0000 mg | CHEWABLE_TABLET | Freq: Every day | ORAL | Status: DC
  Administered 2017-05-23 – 2017-05-26 (×4): 81 mg via ORAL
  Filled 2017-05-22 (×4): qty 1

## 2017-05-22 MED ORDER — SENNOSIDES-DOCUSATE SODIUM 8.6-50 MG PO TABS
2.0000 | ORAL_TABLET | Freq: Two times a day (BID) | ORAL | Status: DC
  Administered 2017-05-23 – 2017-05-27 (×9): 2 via ORAL
  Filled 2017-05-22 (×10): qty 2

## 2017-05-22 MED ORDER — ONDANSETRON HCL 4 MG/2ML IJ SOLN
4.0000 mg | Freq: Four times a day (QID) | INTRAMUSCULAR | Status: DC | PRN
  Administered 2017-05-23 – 2017-05-25 (×4): 4 mg via INTRAVENOUS
  Filled 2017-05-22 (×4): qty 2

## 2017-05-22 MED ORDER — HYDROCODONE-ACETAMINOPHEN 5-325 MG PO TABS
1.0000 | ORAL_TABLET | Freq: Once | ORAL | Status: AC
  Administered 2017-05-22: 1 via ORAL
  Filled 2017-05-22: qty 1

## 2017-05-22 MED ORDER — IPRATROPIUM-ALBUTEROL 0.5-2.5 (3) MG/3ML IN SOLN
3.0000 mL | Freq: Once | RESPIRATORY_TRACT | Status: AC
  Administered 2017-05-22: 3 mL via RESPIRATORY_TRACT
  Filled 2017-05-22: qty 3

## 2017-05-22 MED ORDER — METOPROLOL TARTRATE 5 MG/5ML IV SOLN
5.0000 mg | Freq: Once | INTRAVENOUS | Status: AC
  Administered 2017-05-22: 5 mg via INTRAVENOUS
  Filled 2017-05-22: qty 5

## 2017-05-22 MED ORDER — POLYETHYLENE GLYCOL 3350 17 G PO PACK: 17.0000 g | PACK | Freq: Every day | ORAL | Status: DC | PRN

## 2017-05-22 MED ORDER — ACETAMINOPHEN 325 MG PO TABS: 650.0000 mg | ORAL_TABLET | Freq: Four times a day (QID) | ORAL | Status: DC | PRN

## 2017-05-22 MED ORDER — DILTIAZEM LOAD VIA INFUSION
10.0000 mg | Freq: Once | INTRAVENOUS | Status: AC
  Administered 2017-05-23: 10 mg via INTRAVENOUS
  Filled 2017-05-22: qty 10

## 2017-05-22 MED ORDER — ALBUTEROL SULFATE (2.5 MG/3ML) 0.083% IN NEBU: 2.5000 mg | INHALATION_SOLUTION | RESPIRATORY_TRACT | Status: DC | PRN

## 2017-05-22 MED ORDER — OXYCODONE-ACETAMINOPHEN 10-325 MG PO TABS: 1.0000 | ORAL_TABLET | ORAL | Status: DC | PRN

## 2017-05-22 MED ORDER — IPRATROPIUM-ALBUTEROL 0.5-2.5 (3) MG/3ML IN SOLN
3.0000 mL | Freq: Four times a day (QID) | RESPIRATORY_TRACT | Status: DC
  Administered 2017-05-23: 3 mL via RESPIRATORY_TRACT
  Filled 2017-05-22: qty 3

## 2017-05-22 MED ORDER — BOOST / RESOURCE BREEZE PO LIQD CUSTOM
1.0000 | Freq: Three times a day (TID) | ORAL | Status: DC
  Administered 2017-05-23 – 2017-05-27 (×9): 1 via ORAL
  Filled 2017-05-22 (×3): qty 1

## 2017-05-22 MED ORDER — LABETALOL HCL 5 MG/ML IV SOLN: 10.0000 mg | INTRAVENOUS | Status: DC | PRN

## 2017-05-22 MED ORDER — ACETAMINOPHEN 650 MG RE SUPP: 650.0000 mg | Freq: Four times a day (QID) | RECTAL | Status: DC | PRN

## 2017-05-22 MED ORDER — SODIUM CHLORIDE 0.9% FLUSH
3.0000 mL | Freq: Two times a day (BID) | INTRAVENOUS | Status: DC
  Administered 2017-05-22 – 2017-05-27 (×10): 3 mL via INTRAVENOUS

## 2017-05-22 MED ORDER — ONDANSETRON HCL 4 MG PO TABS
4.0000 mg | ORAL_TABLET | Freq: Four times a day (QID) | ORAL | Status: DC | PRN
  Administered 2017-05-26: 4 mg via ORAL
  Filled 2017-05-22: qty 1

## 2017-05-22 MED ORDER — ALBUTEROL SULFATE (2.5 MG/3ML) 0.083% IN NEBU
2.5000 mg | INHALATION_SOLUTION | Freq: Once | RESPIRATORY_TRACT | Status: AC
  Administered 2017-05-22: 2.5 mg via RESPIRATORY_TRACT
  Filled 2017-05-22: qty 3

## 2017-05-22 MED ORDER — APIXABAN 2.5 MG PO TABS
2.5000 mg | ORAL_TABLET | Freq: Two times a day (BID) | ORAL | Status: DC
  Administered 2017-05-23 – 2017-05-27 (×10): 2.5 mg via ORAL
  Filled 2017-05-22 (×10): qty 1

## 2017-05-22 NOTE — ED Triage Notes (Signed)
Patient is from [place] and complaining of shortness of breath, chest pain with a cough, and chills/sweats. Symptoms have occurred for one week. Patient usually wears oxygen via [place] at 3L PRN but EMS reports his oxygen saturation was in the low 90's, and increased to 4L.

## 2017-05-22 NOTE — ED Notes (Signed)
Attempted to start an IV x 2 with success. Will ask another nurse to look for an IV.

## 2017-05-22 NOTE — H&P (Signed)
History and Physical    [name]:[id number] DOB: February 16, 1946 DOA: 05/22/2017  PCP: Zada Finders, MD   Patient coming from: Home  Chief Complaint: SOB, cough, fever/chills   HPI: [name] is a 72 y.o. male with medical history significant for COPD, atrial fibrillation on Eliquis, chronic kidney disease stage III, depression, and chronic pain, now presenting to the emergency department for evaluation of worsening shortness of breath, cough, and subjective fevers and chills.  Patient reports that he had been in his usual state of health until approximately 1 week ago when he noted the insidious development of worsening dyspnea and cough.  He also noted rhinorrhea at that time and subjective fevers with chills.  Over the ensuing days, dyspnea has continued to worsen, cough is also continued to worsen and become productive with thick white sputum.  Denies any sick contacts or recent travel.  Denies orthopnea, chest pain, or hemoptysis.  ED Course: Upon arrival to the ED, patient is found to be afebrile, saturating adequately on his usual 3 L/min of supplemental oxygen, tachycardic in the 120s, and with stable blood pressure.  EKG features atrial fibrillation with rate 123, LVH, and repolarization abnormal.  Chest x-ray is negative for acute cardiopulmonary disease, but notable for asbestos related pleural disease and likely nipple shadow.  Chemistry panel features a serum creatinine of 1.38, better than priors.  CBC is notable for leukocytosis to 11,500.  Patient was treated with IV and oral Lopressor in the ED with only transient improvement in his heart rate.  He was given duo nebs and albuterol neb.  He continues to have audible wheezing and dyspnea version, and will be acute exacerbation in COPD and atrial fibrillation with rapid rate.  Review of Systems:  All other systems reviewed and apart from HPI, are negative.  Past Medical History:  Diagnosis Date  . Alcohol abuse   . Anxiety    . Avascular necrosis of bones of both hips (HCC)    Status post bilateral hip replacements  . Bipolar disorder ([place])   . CHF (congestive heart failure) ([place])   . Chronic systolic heart failure ([place])    a. NICM - EF 35% with normal cors 2003. b. Last echo 11/2012 - EF 25-30%.  . CKD (chronic kidney disease)    Renal U/S 12/04/2009 showed no pathological findings. Labs 12/04/2009 include normal ESR, C3, C4; neg ANA; SPEP showed nonspecific increase in the alpha-2 region with no M-spike; UPEP showed no monoclonal free light chains; urine IFE showed polyclonal increase in feree Kappa and/or free Lambda light chains. Baseline Cr reported 1.7-2.5.  [name] COPD (chronic obstructive pulmonary disease) ([place])   . Depression   . DVT (deep venous thrombosis) ([place])    He has hypercoagulability with multiple prior DVTs  . E coli bacteremia   . Erectile dysfunction   . Failure to thrive (0-17) 08/2016   DEHYDRATION  . Gastritis   . GERD (gastroesophageal reflux disease)   . HCAP (healthcare-associated pneumonia) 08/24/2015  . Hydrocele, unspecified   . Hyperlipemia   . Hypertension   . 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  Thyroid scan 08/28/2009 was normal with no focal areas of abnormal increased or decreased activity seen; the uptake of I 131 sodium iodide at 24 hours was 5.7%.  TSH and free T4 normalized by 08/16/2009.  . Intestinal obstruction ([place])   . Lung nodule    Chest CT scan on 12/14/2008 showed a nodular  opacity at the left lung base felt to most likely represent scarring.  Follow-up chest CT scan on 06/02/2010 showed parenchymal scarring in the left apex, left  lower lobe, and lingula; some of this scarring at the left base had a nodular appearance, unchanged. Chest 09/2012 - stable.  . Noncompliance   . On home oxygen therapy    "3L periodically" (08/24/2015)  . Osteoarthritis   . Pleural plaque    H/o asbestos exposure. Chest CT on 06/02/2010 showed  stable extensive calcified pleural plaques involving the left hemithorax, consistent with asbestos related pleural disease.  . Portal vein thrombosis   . Protein calorie malnutrition ([place]) 04/2016  . Spondylosis   . STEMI (ST elevation myocardial infarction) ([place]) 10/11/2014  . 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 b. St. Jude gen change 2010.  [name] Thrombocytopenia ([place])   . 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  . Weight loss    Normal colonoscopy by Dr. Olevia Perches on 11/06/2010.    Past Surgical History:  Procedure Laterality Date  . CARDIAC CATHETERIZATION  2003   Nl cors, EF 35%  . CARDIOVERSION N/A 05/27/2013   Procedure: CARDIOVERSION;  Surgeon: Candee Furbish, MD;  Location: [facility] ENDOSCOPY;  Service: Cardiovascular;  Laterality: N/A;  . EMBOLECTOMY Right 12/10/2013   Procedure: Right Femoral Embolectomy; Fasciotomy Right Lower Leg with Intraoperative arteriogram;  Surgeon: Rosetta Posner, MD;  Location: [place];  Service: Vascular;  Laterality: Right;  . EMBOLECTOMY Left 10/12/2014   Procedure: Left Femoral Thrombectomy with intraoperative arteriogram;  Surgeon: Rosetta Posner, MD;  Location: [place];  Service: Vascular;  Laterality: Left;  . FASCIOTOMY CLOSURE Right 12/15/2013   Procedure: LEG FASCIOTOMY CLOSURE;  Surgeon: Rosetta Posner, MD;  Location: [place];  Service: Vascular;  Laterality: Right;  . HEMIARTHROPLASTY HIP Left 09/09/2002   bipolar; Dr. Lafayette Dragon  . INSERT / REPLACE / REMOVE PACEMAKER  02/2003   Dr. Roe Rutherford, [employer] pulse generator identity SR model [id number] serial [id number]; gen change by Greggory Brandy  . INSERT / REPLACE / REMOVE PACEMAKER  06/17/2008   Lead and generator change w/ [employer] Tendril ST model [id number] (serial  [id number]).       . INTRAOPERATIVE ARTERIOGRAM Right 12/10/2013   Procedure: INTRA OPERATIVE ARTERIOGRAM;   Surgeon: Rosetta Posner, MD;  Location: [place];  Service: Vascular;  Laterality: Right;  . JOINT REPLACEMENT    . TEE WITHOUT CARDIOVERSION N/A 05/27/2013   Procedure: TRANSESOPHAGEAL ECHOCARDIOGRAM (TEE);  Surgeon: Candee Furbish, MD;  [facility];  Service: Cardiovascular;  Laterality: N/A;  . TOTAL HIP ARTHROPLASTY Right 03/08/2008    By Dr. Hiram Comber     reports that he has been smoking cigarettes.  He started smoking about 58 years ago. He has a 13.50 pack-year smoking history. He uses smokeless tobacco. He reports that he uses drugs. Drug: Marijuana. Frequency: 1.00 time per week. He reports that he does not drink alcohol.  Allergies  Allergen Reactions  . Amiodarone Other (See Comments)    Thyroiditis in the setting of amiodarone use  . Penicillins Rash and Other (See Comments)    Has patient had a PCN reaction causing immediate rash, facial/tongue/throat swelling, SOB or lightheadedness with hypotension: Yes Has patient had a PCN reaction causing severe rash involving mucus membranes or skin necrosis: No Has patient had a PCN reaction that  required hospitalization No Has patient had a PCN reaction occurring within the last 10 years: No If all of the above answers are "NO", then may proceed with Cephalosporin use.    Family History  Problem Relation Age of Onset  . Hypertension Mother   . Asthma Mother   . Coronary artery disease Mother   . Cancer Mother   . Stroke Mother   . Cancer Brother        Unsure of type.  [name] Heart disease Brother   . Heart attack Brother   . Heart attack Sister   . Colon cancer Neg Hx   . Lung cancer Neg Hx   . Prostate cancer Neg Hx      Prior to Admission medications   Medication Sig Start Date End Date Taking? Authorizing Provider  albuterol (VENTOLIN HFA) 108 (90 BASE) MCG/ACT inhaler Inhale 2 puffs into the lungs every 6 (six) hours as needed for wheezing or shortness of breath. 01/06/15   Aldine Contes, MD  apixaban (ELIQUIS)  2.5 MG TABS tablet Take 1 tablet (2.5 mg total) by mouth 2 (two) times daily. 02/12/17   Zada Finders, MD  aspirin 81 MG chewable tablet Chew 1 tablet (81 mg total) by mouth daily. 02/12/17   Zada Finders, MD  buPROPion (WELLBUTRIN SR) 100 MG 12 hr tablet Take 1 tablet (100 mg total) by mouth 2 (two) times daily. 02/12/17   Zada Finders, MD  feeding supplement (BOOST / RESOURCE BREEZE) LIQD Take 1 Container by mouth 3 (three) times daily between meals. 09/09/16   Burgess Estelle, MD  metoprolol succinate (TOPROL-XL) 50 MG 24 hr tablet Take 100 mg in the morning and 50 mg in the evening. Take with or immediately following a meal. 02/12/17   Zada Finders, MD  midodrine (PROAMATINE) 5 MG tablet Take 1 tablet (5 mg total) by mouth 3 (three) times daily with meals. 02/12/17   Zada Finders, MD  oxyCODONE-acetaminophen (PERCOCET) 10-325 MG tablet Take 1 tablet by mouth every 4 (four) hours as needed for pain. Do not take more than 5 tablets in 24 hours. 03/05/17 03/04/18  Zada Finders, MD  polyethylene glycol (MIRALAX / Floria Raveling) packet Take 17 g by mouth daily as needed. 02/12/17   Zada Finders, MD  rosuvastatin (CRESTOR) 20 MG tablet TAKE 1 TABLET EVERY DAY AT 6 PM 02/12/17   Zada Finders, MD  senna-docusate (SENOKOT-S) 8.6-50 MG tablet Take 2 tablets by mouth 2 (two) times daily. 02/12/17   Zada Finders, MD  tiotropium (SPIRIVA) 18 MCG inhalation capsule Place 1 capsule (18 mcg total) into inhaler and inhale daily. 01/06/15   Aldine Contes, MD    Physical Exam: Vitals:   05/22/17 1833 05/22/17 2006 05/22/17 2109 05/22/17 2233  BP:  113/81 108/88 (!) 138/126  Pulse:  (!) 34  (!) 110  Resp:  (!) 27  20  Temp:      TempSrc:      SpO2:  99% 100% 96%  Weight: 59 kg (130 lb)     Height: 6' 3" (1.905 m)         Constitutional: NAD, calm, cachectic Eyes: PERTLA, lids and conjunctivae normal ENMT: Mucous membranes are moist. Posterior pharynx clear of any exudate or lesions.   Neck: normal,  supple, no masses, no thyromegaly Respiratory: Diminished breath sounds bilaterally with coarse rhonchi diffusely. No accessory muscle use.  Cardiovascular: Rate ~120 and irregular. No significant JVD. Abdomen: No distension, no tenderness, no masses palpated. Bowel sounds normal.  Musculoskeletal:  no clubbing / cyanosis. No joint deformity upper and lower extremities.  Skin: no significant rashes, lesions, ulcers. Warm, dry, well-perfused. Neurologic: CN 2-12 grossly intact. Sensation intact. Strength 5/5 in all 4 limbs.  Psychiatric: Alert and oriented x 3. Calm, cooperative.     Labs on Admission: I have personally reviewed following labs and imaging studies  CBC: Recent Labs  Lab 05/22/17 1822  WBC 11.5*  NEUTROABS 8.1*  HGB 16.9  HCT 49.3  MCV 91.3  PLT 914   Basic Metabolic Panel: Recent Labs  Lab 05/22/17 2126  NA 142  K 4.6  CL 111  CO2 23  GLUCOSE 88  BUN 35*  CREATININE 1.38*  CALCIUM 9.2   GFR: Estimated Creatinine Clearance: 39.8 mL/min (A) (by C-G formula based on SCr of 1.38 mg/dL (H)). Liver Function Tests: Recent Labs  Lab 05/22/17 2126  AST 21  ALT 11*  ALKPHOS 72  BILITOT 1.5*  PROT 7.2  ALBUMIN 4.0   No results for input(s): LIPASE, AMYLASE in the last 168 hours. No results for input(s): AMMONIA in the last 168 hours. Coagulation Profile: No results for input(s): INR, PROTIME in the last 168 hours. Cardiac Enzymes: No results for input(s): CKTOTAL, CKMB, CKMBINDEX, TROPONINI in the last 168 hours. BNP (last 3 results) No results for input(s): PROBNP in the last 8760 hours. HbA1C: No results for input(s): HGBA1C in the last 72 hours. CBG: No results for input(s): GLUCAP in the last 168 hours. Lipid Profile: No results for input(s): CHOL, HDL, LDLCALC, TRIG, CHOLHDL, LDLDIRECT in the last 72 hours. Thyroid Function Tests: No results for input(s): TSH, T4TOTAL, FREET4, T3FREE, THYROIDAB in the last 72 hours. Anemia Panel: No results  for input(s): VITAMINB12, FOLATE, FERRITIN, TIBC, IRON, RETICCTPCT in the last 72 hours. Urine analysis:    Component Value Date/Time   COLORURINE AMBER (A) 10/30/2016 1902   APPEARANCEUR HAZY (A) 10/30/2016 1902   APPEARANCEUR Clear 06/20/2015 1405   LABSPEC 1.025 10/30/2016 1902   PHURINE 5.0 10/30/2016 1902   GLUCOSEU NEGATIVE 10/30/2016 1902   GLUCOSEU NEG mg/dL 12/04/2009 2201   HGBUR NEGATIVE 10/30/2016 1902   HGBUR trace-intact 09/01/2008 1533   BILIRUBINUR NEGATIVE 10/30/2016 1902   BILIRUBINUR Negative 06/20/2015 1405   KETONESUR 5 (A) 10/30/2016 1902   PROTEINUR NEGATIVE 10/30/2016 1902   UROBILINOGEN 1.0 11/11/2014 1518   NITRITE NEGATIVE 10/30/2016 1902   LEUKOCYTESUR NEGATIVE 10/30/2016 1902   LEUKOCYTESUR Negative 06/20/2015 1405   Sepsis Labs: _0 (procalcitonin:4,lacticidven:4) )No results found for this or any previous visit (from the past 240 hour(s)).   Radiological Exams on Admission: Dg Chest 2 View  Result Date: 05/22/2017 CLINICAL DATA:  Shortness of breath. EXAM: CHEST  2 VIEW COMPARISON:  10/30/2016 FINDINGS: No cardiomegaly. Stable mediastinal contours that are distorted by leftward displacement. 2 pacer leads into the right ventricle. There is chronic opacity on the left which is primarily from calcified pleural plaques and pleural thickening based on chest CT in 2017. There is also a right-sided pleural plaques posteriorly based on prior CT. Asymmetric lung density from volume loss on the left and hyperinflation on the right. Nodular density over the right lower chest measuring 14 mm, probable nipple shadow but not confirmed on the lateral view. The IMPRESSION: 1. No evidence of active disease when compared to prior. 2. 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 3. 13 mm nodular density over the lower right lung, probable nipple shadow but not confirmed  in the lateral  projection. After convalescence a follow-up with nipple markers can be obtained. Electronically Signed   By: Monte Fantasia M.D.   On: 05/22/2017 19:49    EKG: Independently reviewed. Atrial fibrillation with RVR (rate 123), LVH with secondary repolarization abnormality.   Assessment/Plan  1. COPD exacerbation; chronic hypoxic respiratory failure  - Presents with 1 week of worsening dyspnea, cough, wheezing with subjective fevers - CXR with stable-appearing chronic changes  - Treated with DuoNeb and albuterol neb in ED  - Check respiratory virus panel and sputum culture, start systemic steroids, schedule DuoNeb and continue prn albuterol nebs   2. Atrial fibrillation with RVR  - Presents in atrial fibrillation with rate 120's-130's, only transiently improved with Lopressor in ED  - Likely secondary to #1 and beta-agonist nebs  - CHADS-VASc is at least 16 (age, CHF, PAD)  - Continue Eliquis  - Start diltiazem infusion and titrate for rate 60-110    3. CKD stage III  - SCr is 1.38 on admission, better than priors  - Renally-dose medications, avoid nephrotoxins    4. Depression  - Stable  - Continue Wellbutrin     DVT prophylaxis: Eliquis  Code Status: Partial, no intubation  Family Communication: Discussed with patient Disposition Plan: Admit to SDU Consults called: None Admission status: Inpatient    Vianne Bulls, MD [facility] Pager [phone]  If 7PM-7AM, please contact night-coverage [email or web address] Password [email or web address]  05/22/2017, 11:05 PM

## 2017-05-22 NOTE — ED Provider Notes (Signed)
[facility] DEPT Provider Note   CSN: [id number] Arrival date & time: 05/22/17  1719     History   Chief Complaint Chief Complaint  Patient presents with  . Shortness of Breath    HPI [name] is a 72 y.o. male.  Patient complains of shortness of breath.  Patient has a history of severe COPD and uses oxygen at home.  Patient also has history of atrial fib   The history is provided by the patient. No language interpreter was used.  Shortness of Breath  This is a recurrent problem. The problem occurs frequently.The current episode started 2 days ago. The problem has not changed since onset.Pertinent negatives include no fever, no headaches, no cough, no chest pain, no abdominal pain and no rash. It is unknown what precipitated the problem. Risk factors include recent prolonged sitting.    Past Medical History:  Diagnosis Date  . Alcohol abuse   . Anxiety   . Avascular necrosis of bones of both hips (HCC)    Status post bilateral hip replacements  . Bipolar disorder ([place])   . CHF (congestive heart failure) ([place])   . Chronic systolic heart failure ([place])    a. NICM - EF 35% with normal cors 2003. b. Last echo 11/2012 - EF 25-30%.  . CKD (chronic kidney disease)    Renal U/S 12/04/2009 showed no pathological findings. Labs 12/04/2009 include normal ESR, C3, C4; neg ANA; SPEP showed nonspecific increase in the alpha-2 region with no M-spike; UPEP showed no monoclonal free light chains; urine IFE showed polyclonal increase in feree Kappa and/or free Lambda light chains. Baseline Cr reported 1.7-2.5.  [name] COPD (chronic obstructive pulmonary disease) ([place])   . Depression   . DVT (deep venous thrombosis) ([place])    He has hypercoagulability with multiple prior DVTs  . E coli bacteremia   . Erectile dysfunction   . Failure to thrive (0-17) 08/2016   DEHYDRATION  . Gastritis   . GERD (gastroesophageal reflux disease)   . HCAP (healthcare-associated  pneumonia) 08/24/2015  . Hydrocele, unspecified   . Hyperlipemia   . Hypertension   . 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  Thyroid scan 08/28/2009 was normal with no focal areas of abnormal increased or decreased activity seen; the uptake of I 131 sodium iodide at 24 hours was 5.7%.  TSH and free T4 normalized by 08/16/2009.  . Intestinal obstruction ([place])   . Lung nodule    Chest CT scan on 12/14/2008 showed a nodular opacity at the left lung base felt to most likely represent scarring.  Follow-up chest CT scan on 06/02/2010 showed parenchymal scarring in the left apex, left  lower lobe, and lingula; some of this scarring at the left base had a nodular appearance, unchanged. Chest 09/2012 - stable.  . Noncompliance   . On home oxygen therapy    "3L periodically" (08/24/2015)  . Osteoarthritis   . Pleural plaque    H/o asbestos exposure. Chest CT on 06/02/2010 showed stable extensive calcified pleural plaques involving the left hemithorax, consistent with asbestos related pleural disease.  . Portal vein thrombosis   . Protein calorie malnutrition ([place]) 04/2016  . Spondylosis   . STEMI (ST elevation myocardial infarction) ([place]) 10/11/2014  . 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 b. St. Jude gen change 2010.  [name] Thrombocytopenia ([place])   . Thrombosis    History of arterial and venous thrombosis  including portal vein thrombosis, deep vein thrombosis, and superior mesenteric artery thrombosis.  . Weight loss    Normal colonoscopy by Dr. Olevia Perches on 11/06/2010.    Patient Active Problem List   Diagnosis Date Noted  . Chronic respiratory failure with hypoxia ([place]) 05/22/2017  . Hypotension 10/09/2016  . History of MI (myocardial infarction)   . History of DVT (deep vein thrombosis)   . 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 Thrombocytopenia ([place]) 09/10/2016  . Abdominal aortic atherosclerosis ([place])  09/05/2016  . Protein-calorie malnutrition, severe 02/18/2016  . Chronic hydrocele   . Asbestosis ([place])   . CKD (chronic kidney disease) stage 3, GFR 30-59 ml/min (HCC)   . Coagulopathy ([place])   . Long term current use of opiate analgesic 06/27/2015  . Neuropathic pain of both feet 05/03/2015  . Longstanding persistent atrial fibrillation ([place]) 10/11/2014  . Healthcare maintenance 01/25/2014  . BPH (benign prostatic hyperplasia) 01/20/2014  . Atypical atrial flutter ([place]) 01/17/2014  . Atrial fibrillation with RVR ([place]) 01/17/2014  . PVD (peripheral vascular disease) ([place]) 12/10/2013  . Chronic combined systolic and diastolic congestive heart failure ([place]) 06/20/2010  . H/O Arterial embolism of leg  06/05/2010  . Nonischemic cardiomyopathy ([place]) 06/01/2010  . Major depressive disorder, recurrent episode ([place]) 01/19/2010  . History of hyperthyroidism 03/13/2009  . Bilateral hip pain 03/23/2007  . OSTEOARTHRITIS 09/05/2006  . COPD with acute exacerbation ([place]) 08/28/2006  . History of tobacco abuse 03/01/2006  . Essential hypertension 03/01/2006  . Cardiac pacemaker in situ 03/01/2006    Past Surgical History:  Procedure Laterality Date  . CARDIAC CATHETERIZATION  2003   Nl cors, EF 35%  . CARDIOVERSION N/A 05/27/2013   Procedure: CARDIOVERSION;  Surgeon: Candee Furbish, MD;  Location: [facility] ENDOSCOPY;  Service: Cardiovascular;  Laterality: N/A;  . EMBOLECTOMY Right 12/10/2013   Procedure: Right Femoral Embolectomy; Fasciotomy Right Lower Leg with Intraoperative arteriogram;  Surgeon: Rosetta Posner, MD;  Location: [facility];  Service: Vascular;  Laterality: Right;  . EMBOLECTOMY Left 10/12/2014   Procedure: Left Femoral Thrombectomy with intraoperative arteriogram;  Surgeon: Rosetta Posner, MD;  Location: [facility];  Service: Vascular;  Laterality: Left;  . FASCIOTOMY CLOSURE Right 12/15/2013   Procedure: LEG FASCIOTOMY CLOSURE;  Surgeon: Rosetta Posner, MD;  Location: [facility];  Service: Vascular;   Laterality: Right;  . HEMIARTHROPLASTY HIP Left 09/09/2002   bipolar; Dr. Lafayette Dragon  . INSERT / REPLACE / REMOVE PACEMAKER  02/2003   Dr. Roe Rutherford, [employer] pulse generator identity SR model [id number] serial [id number]; gen change by Greggory Brandy  . INSERT / REPLACE / REMOVE PACEMAKER  06/17/2008   Lead and generator change w/ [employer] Tendril ST model [id number] (serial  [id number]).       . INTRAOPERATIVE ARTERIOGRAM Right 12/10/2013   Procedure: INTRA OPERATIVE ARTERIOGRAM;  Surgeon: Rosetta Posner, MD;  Location: [facility];  Service: Vascular;  Laterality: Right;  . JOINT REPLACEMENT    . TEE WITHOUT CARDIOVERSION N/A 05/27/2013   Procedure: TRANSESOPHAGEAL ECHOCARDIOGRAM (TEE);  Surgeon: Candee Furbish, MD;  Location[facility] ENDOSCOPY;  Service: Cardiovascular;  Laterality: N/A;  . TOTAL HIP ARTHROPLASTY Right 03/08/2008    By Dr. Hiram Comber       Home Medications    Prior to Admission medications   Medication Sig Start Date End Date Taking? Authorizing Provider  albuterol (VENTOLIN HFA) 108 (90 BASE) MCG/ACT inhaler Inhale 2 puffs into the lungs every 6 (six) hours  as needed for wheezing or shortness of breath. 01/06/15   Aldine Contes, MD  apixaban (ELIQUIS) 2.5 MG TABS tablet Take 1 tablet (2.5 mg total) by mouth 2 (two) times daily. 02/12/17   Zada Finders, MD  aspirin 81 MG chewable tablet Chew 1 tablet (81 mg total) by mouth daily. 02/12/17   Zada Finders, MD  buPROPion (WELLBUTRIN SR) 100 MG 12 hr tablet Take 1 tablet (100 mg total) by mouth 2 (two) times daily. 02/12/17   Zada Finders, MD  feeding supplement (BOOST / RESOURCE BREEZE) LIQD Take 1 Container by mouth 3 (three) times daily between meals. 09/09/16   Burgess Estelle, MD  metoprolol succinate (TOPROL-XL) 50 MG 24 hr tablet Take 100 mg in the morning and 50 mg in the evening. Take with or immediately following a meal. 02/12/17   Zada Finders, MD  midodrine (PROAMATINE) 5 MG tablet Take 1 tablet (5 mg total) by mouth 3 (three)  times daily with meals. 02/12/17   Zada Finders, MD  oxyCODONE-acetaminophen (PERCOCET) 10-325 MG tablet Take 1 tablet by mouth every 4 (four) hours as needed for pain. Do not take more than 5 tablets in 24 hours. 03/05/17 03/04/18  Zada Finders, MD  polyethylene glycol (MIRALAX / Floria Raveling) packet Take 17 g by mouth daily as needed. 02/12/17   Zada Finders, MD  rosuvastatin (CRESTOR) 20 MG tablet TAKE 1 TABLET EVERY DAY AT 6 PM 02/12/17   Zada Finders, MD  senna-docusate (SENOKOT-S) 8.6-50 MG tablet Take 2 tablets by mouth 2 (two) times daily. 02/12/17   Zada Finders, MD  tiotropium (SPIRIVA) 18 MCG inhalation capsule Place 1 capsule (18 mcg total) into inhaler and inhale daily. 01/06/15   Aldine Contes, MD    Family History Family History  Problem Relation Age of Onset  . Hypertension Mother   . Asthma Mother   . Coronary artery disease Mother   . Cancer Mother   . Stroke Mother   . Cancer Brother        Unsure of type.  [name] Heart disease Brother   . Heart attack Brother   . Heart attack Sister   . Colon cancer Neg Hx   . Lung cancer Neg Hx   . Prostate cancer Neg Hx     Social History Social History   Tobacco Use  . Smoking status: Current Some Day Smoker    Packs/day: 0.25    Years: 54.00    Pack years: 13.50    Types: Cigarettes    Start date: 28  . Smokeless tobacco: Current User  . Tobacco comment: cutting back  Substance Use Topics  . Alcohol use: No    Alcohol/week: 0.0 oz    Comment: History of ETOH Abuse."  . Drug use: Yes    Frequency: 1.0 times per week    Types: Marijuana    Comment: Every other day.      Allergies   Amiodarone and Penicillins   Review of Systems Review of Systems  Constitutional: Negative for appetite change, fatigue and fever.  HENT: Negative for congestion, ear discharge and sinus pressure.   Eyes: Negative for discharge.  Respiratory: Positive for shortness of breath. Negative for cough.   Cardiovascular: Positive for  palpitations. Negative for chest pain.  Gastrointestinal: Negative for abdominal pain and diarrhea.  Genitourinary: Negative for frequency and hematuria.  Musculoskeletal: Negative for back pain.  Skin: Negative for rash.  Neurological: Negative for seizures and headaches.  Psychiatric/Behavioral: Negative for hallucinations.  Physical Exam Updated Vital Signs BP (!) 138/126   Pulse (!) 110   Temp 98.2 F (36.8 C) (Oral)   Resp 20   Ht '6\' 3"'  (1.905 m)   Wt 59 kg (130 lb)   SpO2 96%   BMI 16.25 kg/m   Physical Exam  Constitutional: He is oriented to person, place, and time. He appears well-developed.  HENT:  Head: Normocephalic.  Eyes: Conjunctivae and EOM are normal. No scleral icterus.  Neck: Neck supple. No thyromegaly present.  Cardiovascular: Exam reveals no gallop and no friction rub.  No murmur heard. Irregular rapid rate  Pulmonary/Chest: No stridor. He has wheezes. He has no rales. He exhibits no tenderness.  Abdominal: He exhibits no distension. There is no tenderness. There is no rebound.  Musculoskeletal: Normal range of motion. He exhibits no edema.  Lymphadenopathy:    He has no cervical adenopathy.  Neurological: He is oriented to person, place, and time. He exhibits normal muscle tone. Coordination normal.  Skin: No rash noted. No erythema.  Psychiatric: He has a normal mood and affect. His behavior is normal.     ED Treatments / Results  Labs (all labs ordered are listed, but only abnormal results are displayed) Labs Reviewed  CBC WITH DIFFERENTIAL/PLATELET - Abnormal; Notable for the following components:      Result Value   WBC 11.5 (*)    Neutro Abs 8.1 (*)    Monocytes Absolute 1.2 (*)    All other components within normal limits  BRAIN NATRIURETIC PEPTIDE - Abnormal; Notable for the following components:   B Natriuretic Peptide 122.7 (*)    All other components within normal limits  COMPREHENSIVE METABOLIC PANEL - Abnormal; Notable for  the following components:   BUN 35 (*)    Creatinine, Ser 1.38 (*)    ALT 11 (*)    Total Bilirubin 1.5 (*)    GFR calc non Af Amer 49 (*)    GFR calc Af Amer 57 (*)    All other components within normal limits    EKG  EKG Interpretation None       Radiology Dg Chest 2 View  Result Date: 05/22/2017 CLINICAL DATA:  Shortness of breath. EXAM: CHEST  2 VIEW COMPARISON:  10/30/2016 FINDINGS: No cardiomegaly. Stable mediastinal contours that are distorted by leftward displacement. 2 pacer leads into the right ventricle. There is chronic opacity on the left which is primarily from calcified pleural plaques and pleural thickening based on chest CT in 2017. There is also a right-sided pleural plaques posteriorly based on prior CT. Asymmetric lung density from volume loss on the left and hyperinflation on the right. Nodular density over the right lower chest measuring 14 mm, probable nipple shadow but not confirmed on the lateral view. The IMPRESSION: 1. No evidence of active disease when compared to prior. 2. 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 3. 13 mm nodular density over the lower right lung, probable nipple shadow but not confirmed in the lateral projection. After convalescence a follow-up with nipple markers can be obtained. Electronically Signed   By: Monte Fantasia M.D.   On: 05/22/2017 19:49    Procedures Procedures (including critical care time)  Medications Ordered in ED Medications  ipratropium-albuterol (DUONEB) 0.5-2.5 (3) MG/3ML nebulizer solution 3 mL (3 mLs Nebulization Given 05/22/17 1844)  albuterol (PROVENTIL) (2.5 MG/3ML) 0.083% nebulizer solution 2.5 mg (2.5 mg Nebulization Given 05/22/17 1844)  metoprolol tartrate (  LOPRESSOR) tablet 50 mg (50 mg Oral Given 05/22/17 2006)  HYDROcodone-acetaminophen (NORCO/VICODIN) 5-325 MG per tablet 1 tablet (1 tablet Oral Given 05/22/17 2107)    metoprolol tartrate (LOPRESSOR) injection 5 mg (5 mg Intravenous Given 05/22/17 2154)  metoprolol tartrate (LOPRESSOR) injection 5 mg (5 mg Intravenous Given 05/22/17 2242)     Initial Impression / Assessment and Plan / ED Course  I have reviewed the triage vital signs and the nursing notes.  Pertinent labs & imaging results that were available during my care of the patient were reviewed by me and considered in my medical decision making (see chart for details).    CRITICAL CARE Performed by: Milton Ferguson Total critical care time: 40 minutes Critical care time was exclusive of separately billable procedures and treating other patients. Critical care was necessary to treat or prevent imminent or life-threatening deterioration. Critical care was time spent personally by me on the following activities: development of treatment plan with patient and/or surrogate as well as nursing, discussions with consultants, evaluation of patient's response to treatment, examination of patient, obtaining history from patient or surrogate, ordering and performing treatments and interventions, ordering and review of laboratory studies, ordering and review of radiographic studies, pulse oximetry and re-evaluation of patient's condition.  Patient with COPD exacerbation and rapid atrial fib.  Patient responded some to IV Lopressor but still had a rapid rate.  He was given neb treatment that increased his heart rate to 150.  But it responded later to the Lopressor.  Patient will be admitted for rapid atrial fib and COPD exacerbation Final Clinical Impressions(s) / ED Diagnoses   Final diagnoses:  COPD exacerbation [facility] 2)    ED Discharge Orders    None       Milton Ferguson, MD 05/22/17 2304

## 2017-05-22 NOTE — ED Notes (Signed)
Katie, RN attempted to start an IV x 1 with no success. Patient reported to her that he will only have one more stick. Will consult IV team.

## 2017-05-22 NOTE — Telephone Encounter (Signed)
Patient called his ride hasn't showed up for his appt and he is thinking about calling EMS cause he is not feeling good, patient wanted [place] to know

## 2017-05-23 ENCOUNTER — Encounter (HOSPITAL_COMMUNITY): Payer: Self-pay | Admitting: Physician Assistant

## 2017-05-23 ENCOUNTER — Other Ambulatory Visit: Payer: Self-pay

## 2017-05-23 DIAGNOSIS — J441 Chronic obstructive pulmonary disease with (acute) exacerbation: Secondary | ICD-10-CM

## 2017-05-23 DIAGNOSIS — I5022 Chronic systolic (congestive) heart failure: Secondary | ICD-10-CM

## 2017-05-23 DIAGNOSIS — I4891 Unspecified atrial fibrillation: Secondary | ICD-10-CM

## 2017-05-23 LAB — BASIC METABOLIC PANEL
ANION GAP: 9 (ref 5–15)
BUN: 36 mg/dL — ABNORMAL HIGH (ref 6–20)
CHLORIDE: 109 mmol/L (ref 101–111)
CO2: 21 mmol/L — AB (ref 22–32)
Calcium: 9.4 mg/dL (ref 8.9–10.3)
Creatinine, Ser: 1.36 mg/dL — ABNORMAL HIGH (ref 0.61–1.24)
GFR calc non Af Amer: 50 mL/min — ABNORMAL LOW (ref >60)
GFR, EST AFRICAN AMERICAN: 58 mL/min — AB (ref >60)
GLUCOSE: 184 mg/dL — AB (ref 65–99)
Potassium: 4.5 mmol/L (ref 3.5–5.1)
Sodium: 139 mmol/L (ref 135–145)

## 2017-05-23 LAB — RESPIRATORY PANEL BY PCR
Adenovirus: NOT DETECTED
Bordetella pertussis: NOT DETECTED
CHLAMYDOPHILA PNEUMONIAE-RVPPCR: NOT DETECTED
CORONAVIRUS NL63-RVPPCR: NOT DETECTED
Coronavirus 229E: NOT DETECTED
Coronavirus HKU1: NOT DETECTED
Coronavirus OC43: NOT DETECTED
INFLUENZA A-RVPPCR: NOT DETECTED
Influenza B: NOT DETECTED
MYCOPLASMA PNEUMONIAE-RVPPCR: NOT DETECTED
Metapneumovirus: NOT DETECTED
PARAINFLUENZA VIRUS 4-RVPPCR: NOT DETECTED
Parainfluenza Virus 1: NOT DETECTED
Parainfluenza Virus 2: NOT DETECTED
Parainfluenza Virus 3: NOT DETECTED
Respiratory Syncytial Virus: NOT DETECTED
Rhinovirus / Enterovirus: NOT DETECTED

## 2017-05-23 LAB — CBC WITH DIFFERENTIAL/PLATELET
BASOS PCT: 0 %
Basophils Absolute: 0 10*3/uL (ref 0.0–0.1)
Eosinophils Absolute: 0 10*3/uL (ref 0.0–0.7)
Eosinophils Relative: 0 %
HEMATOCRIT: 45.5 % (ref 39.0–52.0)
HEMOGLOBIN: 15.6 g/dL (ref 13.0–17.0)
LYMPHS ABS: 0.7 10*3/uL (ref 0.7–4.0)
LYMPHS PCT: 9 %
MCH: 31.2 pg (ref 26.0–34.0)
MCHC: 34.3 g/dL (ref 30.0–36.0)
MCV: 91 fL (ref 78.0–100.0)
MONO ABS: 0.1 10*3/uL (ref 0.1–1.0)
MONOS PCT: 1 %
NEUTROS PCT: 90 %
Neutro Abs: 7.1 10*3/uL (ref 1.7–7.7)
Platelets: 140 10*3/uL — ABNORMAL LOW (ref 150–400)
RBC: 5 MIL/uL (ref 4.22–5.81)
RDW: 15 % (ref 11.5–15.5)
WBC: 7.9 10*3/uL (ref 4.0–10.5)

## 2017-05-23 LAB — TROPONIN I

## 2017-05-23 LAB — CBG MONITORING, ED: Glucose-Capillary: 189 mg/dL — ABNORMAL HIGH (ref 65–99)

## 2017-05-23 LAB — MRSA PCR SCREENING: MRSA by PCR: NEGATIVE

## 2017-05-23 LAB — TSH: TSH: 0.767 u[IU]/mL (ref 0.350–4.500)

## 2017-05-23 MED ORDER — MOMETASONE FURO-FORMOTEROL FUM 100-5 MCG/ACT IN AERO
2.0000 | INHALATION_SPRAY | Freq: Two times a day (BID) | RESPIRATORY_TRACT | Status: DC
  Administered 2017-05-24 – 2017-05-26 (×4): 2 via RESPIRATORY_TRACT
  Filled 2017-05-23: qty 8.8

## 2017-05-23 MED ORDER — METHYLPREDNISOLONE SODIUM SUCC 125 MG IJ SOLR
60.0000 mg | Freq: Three times a day (TID) | INTRAMUSCULAR | Status: DC
  Administered 2017-05-23 – 2017-05-24 (×3): 60 mg via INTRAVENOUS
  Filled 2017-05-23 (×3): qty 2

## 2017-05-23 MED ORDER — OXYCODONE HCL 5 MG PO TABS
5.0000 mg | ORAL_TABLET | ORAL | Status: DC | PRN
  Administered 2017-05-23 – 2017-05-27 (×9): 5 mg via ORAL
  Filled 2017-05-23 (×9): qty 1

## 2017-05-23 MED ORDER — OXYCODONE-ACETAMINOPHEN 5-325 MG PO TABS
1.0000 | ORAL_TABLET | ORAL | Status: DC | PRN
  Administered 2017-05-23 – 2017-05-27 (×11): 1 via ORAL
  Filled 2017-05-23 (×11): qty 1

## 2017-05-23 MED ORDER — METOPROLOL SUCCINATE ER 50 MG PO TB24
50.0000 mg | ORAL_TABLET | Freq: Every day | ORAL | Status: DC
  Administered 2017-05-23 – 2017-05-26 (×4): 50 mg via ORAL
  Filled 2017-05-23 (×5): qty 1

## 2017-05-23 MED ORDER — IPRATROPIUM-ALBUTEROL 0.5-2.5 (3) MG/3ML IN SOLN
3.0000 mL | Freq: Three times a day (TID) | RESPIRATORY_TRACT | Status: DC
  Administered 2017-05-23 – 2017-05-27 (×12): 3 mL via RESPIRATORY_TRACT
  Filled 2017-05-23 (×12): qty 3

## 2017-05-23 NOTE — ED Notes (Signed)
PT CHANGED TO HOSPITAL BED. PT TOLERATED

## 2017-05-23 NOTE — ED Notes (Signed)
ED TO INPATIENT HANDOFF REPORT  Name/Age/Gender [name] Reason 72 y.o. male  Code Status    Code Status Orders  (From admission, onward)        Start     Ordered   05/23/17 1157  Limited resuscitation (code)  Continuous    Question Answer Comment  In the event of cardiac or respiratory ARREST: Initiate Code Blue, Call Rapid Response Yes   In the event of cardiac or respiratory ARREST: Perform CPR No   In the event of cardiac or respiratory ARREST: Perform Intubation/Mechanical Ventilation No   In the event of cardiac or respiratory ARREST: Use NIPPV/BiPAp only if indicated Yes   In the event of cardiac or respiratory ARREST: Administer ACLS medications if indicated Yes   In the event of cardiac or respiratory ARREST: Perform Defibrillation or Cardioversion if indicated No      05/23/17 1157    Code Status History    Date Active Date Inactive Code Status Order ID Comments User Context   05/22/2017 23:05 05/23/2017 11:57 Partial Code [id number]  Vianne Bulls, MD ED   10/30/2016 22:53 11/05/2016 21:32 Partial Code [id number]  Florinda Marker, MD Inpatient   10/08/2016 20:58 10/14/2016 20:22 Partial Code [id number]  Norman Herrlich, MD ED   10/08/2016 20:50 10/08/2016 20:58 Full Code [id number]  Norman Herrlich, MD ED   09/05/2016 13:59 09/10/2016 20:10 DNR [id number]  Burgess Estelle, MD Inpatient   09/04/2016 18:32 09/05/2016 13:59 Full Code [id number]  Zada Finders, MD Inpatient   04/29/2016 18:11 05/02/2016 14:20 Full Code [id number]  Juliet Rude, MD Inpatient   02/16/2016 21:02 02/20/2016 17:09 Full Code [id number]  Zada Finders, MD Inpatient   02/02/2016 17:23 02/07/2016 18:57 Full Code [id number]  Norman Herrlich, MD ED   12/15/2015 21:32 12/18/2015 22:17 Full Code [id number]  Zada Finders, MD ED   09/27/2015 20:13 10/03/2015 19:37 Full Code [id number]  Milagros Loll, MD Inpatient   09/19/2015 21:46 09/22/2015 21:35 Full Code [id number]  Germain Osgood, PA-C ED   08/24/2015 09:45 08/25/2015 12:44 Full  Code [id number]  Corky Sox, MD ED   08/12/2015 06:49 08/18/2015 22:38 Full Code [id number]  Vickii Chafe Corine Shelter, MD ED   07/29/2015 00:46 08/08/2015 21:30 Full Code [id number]  Francesca Oman, DO Inpatient   11/09/2014 17:46 11/17/2014 18:25 Full Code [id number]  Jones Bales, MD Inpatient   10/24/2014 20:58 10/27/2014 19:57 Full Code [id number]  Bethena Roys, MD Inpatient   10/11/2014 17:31 10/19/2014 13:48 Full Code [id number]  Cresenciano Genre Inpatient   01/17/2014 19:29 01/21/2014 21:10 Full Code [id number]  Cresenciano Genre Inpatient   12/11/2013 00:43 12/20/2013 17:26 Full Code [id number]  Rosetta Posner, MD Inpatient   07/02/2013 20:19 07/04/2013 20:31 Full Code [id number]  Olga Millers, MD Inpatient   06/02/2013 17:26 06/12/2013 15:11 Full Code [id number]  Reola Mosher Inpatient   05/22/2013 22:49 05/30/2013 18:23 Full Code [id number]  Othella Boyer, MD Inpatient   05/17/2013 17:51 05/19/2013 19:34 Full Code [id number]  Wilber Oliphant, MD Inpatient   10/12/2012 16:32 10/14/2012 18:54 Full Code [id number]  Charlann Lange, MD ED      Home/SNF/Other Home  Chief Complaint Flu like Symptoms  Level of Care/Admitting Diagnosis ED Disposition    ED Disposition Condition [place]: [place] [[id number]]  Level of Care: Stepdown [14]  Admit to SDU based on following criteria: Hemodynamic compromise or significant risk of  instability:  Patient requiring short term acute titration and management of vasoactive drips, and invasive monitoring (i.e., CVP and Arterial line).  Diagnosis: COPD with acute exacerbation [facility]) [[id number]]  Admitting Physician: Vianne Bulls [[id number]]  Attending Physician: Vianne Bulls [[id number]]  Estimated length of stay: past midnight tomorrow  Certification:: I certify this patient will need inpatient services for at least 2 midnights  PT Class (Do Not Modify): Inpatient [101]  PT Acc Code (Do Not Modify): Private [1]       Medical  History Past Medical History:  Diagnosis Date  . Alcohol abuse   . Anxiety   . Asbestos exposure   . Avascular necrosis of bones of both hips (HCC)    Status post bilateral hip replacements  . Bipolar disorder ([place])   . Chronic systolic heart failure ([place])    a. NICM - EF 35% with normal cors 2003. b. Last echo 11/2012 - EF 25-30%.  . CKD (chronic kidney disease), stage III ([place])    Renal U/S 12/04/2009 showed no pathological findings. Labs 12/04/2009 include normal ESR, C3, C4; neg ANA; SPEP showed nonspecific increase in the alpha-2 region with no M-spike; UPEP showed no monoclonal free light chains; urine IFE showed polyclonal increase in feree Kappa and/or free Lambda light chains. Baseline Cr reported 1.7-2.5.  [name] COPD (chronic obstructive pulmonary disease) ([place])   . Depression   . DVT (deep venous thrombosis) ([place])    He has hypercoagulability with multiple prior DVTs  . E coli bacteremia   . Erectile dysfunction   . Failure to thrive (0-17) 08/2016   DEHYDRATION  . Gastritis   . GERD (gastroesophageal reflux disease)   . HCAP (healthcare-associated pneumonia) 08/24/2015  . Hydrocele, unspecified   . Hyperlipemia   . Hypertension   . 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  Thyroid scan 08/28/2009 was normal with no focal areas of abnormal increased or decreased activity seen; the uptake of I 131 sodium iodide at 24 hours was 5.7%.  TSH and free T4 normalized by 08/16/2009.  . Intestinal obstruction ([place])   . Lung nodule    Chest CT scan on 12/14/2008 showed a nodular opacity at the left lung base felt to most likely represent scarring.  Follow-up chest CT scan on 06/02/2010 showed parenchymal scarring in the left apex, left  lower lobe, and lingula; some of this scarring at the left base had a nodular appearance, unchanged. Chest 09/2012 - stable.  [name] NICM (nonischemic cardiomyopathy) ([place])   . Noncompliance   . On home oxygen therapy    "3L  periodically" (08/24/2015)  . Osteoarthritis   . Permanent atrial fibrillation ([place])    a. failed ablation in 2010 due to inability to access/venous occlusions. b. failed multaq, amiodarone, tikosyn. c. has not been felt to be a good candidate for AVN ablation due to recurrent infections/bacteremia history.  . Pleural plaque    H/o asbestos exposure. Chest CT on 06/02/2010 showed stable extensive calcified pleural plaques involving the left hemithorax, consistent with asbestos related pleural disease.  . Portal vein thrombosis   . Protein calorie malnutrition ([place]) 04/2016  . Spondylosis   . STEMI (ST elevation myocardial infarction) ([place]) 31/54/0086   a. felt embolic in nature due to noncompliance with anticoagulation, LHC not pursued.  . 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 b. St. Jude gen change 2010.  [name] Thrombocytopenia ([place])   . Thrombosis    History  of arterial and venous thrombosis including portal vein thrombosis, deep vein thrombosis, and superior mesenteric artery thrombosis.  . Weight loss    Normal colonoscopy by Dr. Olevia Perches on 11/06/2010.    Allergies Allergies  Allergen Reactions  . Amiodarone Other (See Comments)    Thyroiditis in the setting of amiodarone use  . Penicillins Rash and Other (See Comments)    Has patient had a PCN reaction causing immediate rash, facial/tongue/throat swelling, SOB or lightheadedness with hypotension: Yes Has patient had a PCN reaction causing severe rash involving mucus membranes or skin necrosis: No Has patient had a PCN reaction that required hospitalization No Has patient had a PCN reaction occurring within the last 10 years: No If all of the above answers are "NO", then may proceed with Cephalosporin use.    IV Location/Drains/Wounds Patient Lines/Drains/Airways Status   Active Line/Drains/Airways    Name:   Placement date:   Placement time:   Site:   Days:   Peripheral IV 05/22/17 Left Forearm    05/22/17    2027    Forearm   1   Peripheral IV 05/23/17 Left Forearm   05/23/17    0738    Forearm   less than 1          Labs/Imaging Results for orders placed or performed during the hospital encounter of 05/22/17 (from the past 48 hour(s))  CBC with Differential/Platelet     Status: Abnormal   Collection Time: 05/22/17  6:22 PM  Result Value Ref Range   WBC 11.5 (H) 4.0 - 10.5 K/uL   RBC 5.40 4.22 - 5.81 MIL/uL   Hemoglobin 16.9 13.0 - 17.0 g/dL   HCT 49.3 39.0 - 52.0 %   MCV 91.3 78.0 - 100.0 fL   MCH 31.3 26.0 - 34.0 pg   MCHC 34.3 30.0 - 36.0 g/dL   RDW 15.0 11.5 - 15.5 %   Platelets 154 150 - 400 K/uL   Neutrophils Relative % 71 %   Neutro Abs 8.1 (H) 1.7 - 7.7 K/uL   Lymphocytes Relative 18 %   Lymphs Abs 2.1 0.7 - 4.0 K/uL   Monocytes Relative 10 %   Monocytes Absolute 1.2 (H) 0.1 - 1.0 K/uL   Eosinophils Relative 1 %   Eosinophils Absolute 0.1 0.0 - 0.7 K/uL   Basophils Relative 0 %   Basophils Absolute 0.0 0.0 - 0.1 K/uL  Brain natriuretic peptide     Status: Abnormal   Collection Time: 05/22/17  6:22 PM  Result Value Ref Range   B Natriuretic Peptide 122.7 (H) 0.0 - 100.0 pg/mL  Comprehensive metabolic panel     Status: Abnormal   Collection Time: 05/22/17  9:26 PM  Result Value Ref Range   Sodium 142 135 - 145 mmol/L   Potassium 4.6 3.5 - 5.1 mmol/L   Chloride 111 101 - 111 mmol/L   CO2 23 22 - 32 mmol/L   Glucose, Bld 88 65 - 99 mg/dL   BUN 35 (H) 6 - 20 mg/dL   Creatinine, Ser 1.38 (H) 0.61 - 1.24 mg/dL   Calcium 9.2 8.9 - 10.3 mg/dL   Total Protein 7.2 6.5 - 8.1 g/dL   Albumin 4.0 3.5 - 5.0 g/dL   AST 21 15 - 41 U/L   ALT 11 (L) 17 - 63 U/L   Alkaline Phosphatase 72 38 - 126 U/L   Total Bilirubin 1.5 (H) 0.3 - 1.2 mg/dL   GFR calc non Af Amer 49 (L) >60  mL/min   GFR calc Af Amer 57 (L) >60 mL/min    Comment: (NOTE) The eGFR has been calculated using the CKD EPI equation. This calculation has not been validated in all clinical  situations. eGFR's persistently <60 mL/min signify possible Chronic Kidney Disease.    Anion gap 8 5 - 15  CBG monitoring, ED     Status: Abnormal   Collection Time: 05/23/17  7:54 AM  Result Value Ref Range   Glucose-Capillary 189 (H) 65 - 99 mg/dL  Basic metabolic panel     Status: Abnormal   Collection Time: 05/23/17  8:16 AM  Result Value Ref Range   Sodium 139 135 - 145 mmol/L   Potassium 4.5 3.5 - 5.1 mmol/L   Chloride 109 101 - 111 mmol/L   CO2 21 (L) 22 - 32 mmol/L   Glucose, Bld 184 (H) 65 - 99 mg/dL   BUN 36 (H) 6 - 20 mg/dL   Creatinine, Ser 1.36 (H) 0.61 - 1.24 mg/dL   Calcium 9.4 8.9 - 10.3 mg/dL   GFR calc non Af Amer 50 (L) >60 mL/min   GFR calc Af Amer 58 (L) >60 mL/min    Comment: (NOTE) The eGFR has been calculated using the CKD EPI equation. This calculation has not been validated in all clinical situations. eGFR's persistently <60 mL/min signify possible Chronic Kidney Disease.    Anion gap 9 5 - 15  CBC WITH DIFFERENTIAL     Status: Abnormal   Collection Time: 05/23/17  8:16 AM  Result Value Ref Range   WBC 7.9 4.0 - 10.5 K/uL   RBC 5.00 4.22 - 5.81 MIL/uL   Hemoglobin 15.6 13.0 - 17.0 g/dL   HCT 45.5 39.0 - 52.0 %   MCV 91.0 78.0 - 100.0 fL   MCH 31.2 26.0 - 34.0 pg   MCHC 34.3 30.0 - 36.0 g/dL   RDW 15.0 11.5 - 15.5 %   Platelets 140 (L) 150 - 400 K/uL   Neutrophils Relative % 90 %   Neutro Abs 7.1 1.7 - 7.7 K/uL   Lymphocytes Relative 9 %   Lymphs Abs 0.7 0.7 - 4.0 K/uL   Monocytes Relative 1 %   Monocytes Absolute 0.1 0.1 - 1.0 K/uL   Eosinophils Relative 0 %   Eosinophils Absolute 0.0 0.0 - 0.7 K/uL   Basophils Relative 0 %   Basophils Absolute 0.0 0.0 - 0.1 K/uL  Troponin I (q 6hr x 3)     Status: None   Collection Time: 05/23/17 12:54 PM  Result Value Ref Range   Troponin I <0.03 <0.03 ng/mL  TSH     Status: None   Collection Time: 05/23/17 12:55 PM  Result Value Ref Range   TSH 0.767 0.350 - 4.500 uIU/mL    Comment: Performed by  a 3rd Generation assay with a functional sensitivity of <=0.01 uIU/mL.   *Note: Due to a large number of results and/or encounters for the requested time period, some results have not been displayed. A complete set of results can be found in Results Review.   Dg Chest 2 View  Result Date: 05/22/2017 CLINICAL DATA:  Shortness of breath. EXAM: CHEST  2 VIEW COMPARISON:  10/30/2016 FINDINGS: No cardiomegaly. Stable mediastinal contours that are distorted by leftward displacement. 2 pacer leads into the right ventricle. There is chronic opacity on the left which is primarily from calcified pleural plaques and pleural thickening based on chest CT in 2017. There is also a right-sided pleural  plaques posteriorly based on prior CT. Asymmetric lung density from volume loss on the left and hyperinflation on the right. Nodular density over the right lower chest measuring 14 mm, probable nipple shadow but not confirmed on the lateral view. The IMPRESSION: 1. No evidence of active disease when compared to prior. 2. 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 3. 13 mm nodular density over the lower right lung, probable nipple shadow but not confirmed in the lateral projection. After convalescence a follow-up with nipple markers can be obtained. Electronically Signed   By: Monte Fantasia M.D.   On: 05/22/2017 19:49    Pending Labs Unresulted Labs (From admission, onward)   Start     Ordered   05/24/17 3474  Basic metabolic panel  Daily,   R     05/23/17 1219   05/24/17 0000  CBC  Tomorrow morning,   R     05/23/17 1219   05/23/17 1228  Culture, blood (Routine X 2) w Reflex to ID Panel  BLOOD CULTURE X 2,   R     05/23/17 1227   05/23/17 1219  Troponin I (q 6hr x 3)  Now then every 6 hours,   R     05/23/17 1219   05/22/17 2329  Respiratory Panel by PCR  (Respiratory virus panel)  Once,   R     05/22/17 2328   05/22/17 2304   Culture, sputum-assessment  Once,   R     05/22/17 2305      Vitals/Pain Today's Vitals   05/23/17 1200 05/23/17 1230 05/23/17 1400 05/23/17 1425  BP: 105/70 108/89 104/73   Pulse: 92 99 71   Resp: 20 13 (!) 24   Temp:      TempSrc:      SpO2: 98% 98% 99%   Weight:      Height:      PainSc:    6     Isolation Precautions Droplet precaution  Medications Medications  apixaban (ELIQUIS) tablet 2.5 mg (2.5 mg Oral Given 05/23/17 1255)  aspirin chewable tablet 81 mg (81 mg Oral Given 05/23/17 1255)  buPROPion (WELLBUTRIN SR) 12 hr tablet 100 mg (100 mg Oral Given 05/23/17 1254)  feeding supplement (BOOST / RESOURCE BREEZE) liquid 1 Container (1 Container Oral Not Given 05/23/17 1252)  metoprolol succinate (TOPROL-XL) 24 hr tablet 100 mg (100 mg Oral Given 05/23/17 1253)  polyethylene glycol (MIRALAX / GLYCOLAX) packet 17 g (not administered)  rosuvastatin (CRESTOR) tablet 20 mg (not administered)  senna-docusate (Senokot-S) tablet 2 tablet (2 tablets Oral Given 05/23/17 1255)  sodium chloride flush (NS) 0.9 % injection 3 mL (3 mLs Intravenous Given 05/23/17 1017)  acetaminophen (TYLENOL) tablet 650 mg (not administered)    Or  acetaminophen (TYLENOL) suppository 650 mg (not administered)  ondansetron (ZOFRAN) tablet 4 mg (not administered)    Or  ondansetron (ZOFRAN) injection 4 mg (not administered)  albuterol (PROVENTIL) (2.5 MG/3ML) 0.083% nebulizer solution 2.5 mg (not administered)  diltiazem (CARDIZEM) 1 mg/mL load via infusion 10 mg (10 mg Intravenous Bolus from Bag 05/23/17 0051)    And  diltiazem (CARDIZEM) 100 mg in dextrose 5% 160m (1 mg/mL) infusion (5 mg/hr Intravenous New Bag/Given 05/23/17 0054)  ipratropium-albuterol (DUONEB) 0.5-2.5 (3) MG/3ML nebulizer solution 3 mL (3 mLs Nebulization Not Given 05/23/17 1411)  oxyCODONE-acetaminophen (PERCOCET/ROXICET) 5-325 MG per tablet 1 tablet (1 tablet Oral Given 05/23/17 1424)    And  oxyCODONE (  Oxy IR/ROXICODONE) immediate release  tablet 5 mg (5 mg Oral Given 05/23/17 1424)  metoprolol succinate (TOPROL-XL) 24 hr tablet 50 mg (not administered)  methylPREDNISolone sodium succinate (SOLU-MEDROL) 125 mg/2 mL injection 60 mg (not administered)  mometasone-formoterol (DULERA) 100-5 MCG/ACT inhaler 2 puff (not administered)  ipratropium-albuterol (DUONEB) 0.5-2.5 (3) MG/3ML nebulizer solution 3 mL (3 mLs Nebulization Given 05/22/17 1844)  albuterol (PROVENTIL) (2.5 MG/3ML) 0.083% nebulizer solution 2.5 mg (2.5 mg Nebulization Given 05/22/17 1844)  metoprolol tartrate (LOPRESSOR) tablet 50 mg (50 mg Oral Given 05/22/17 2006)  HYDROcodone-acetaminophen (NORCO/VICODIN) 5-325 MG per tablet 1 tablet (1 tablet Oral Given 05/22/17 2107)  metoprolol tartrate (LOPRESSOR) injection 5 mg (5 mg Intravenous Given 05/22/17 2154)  metoprolol tartrate (LOPRESSOR) injection 5 mg (5 mg Intravenous Given 05/22/17 2242)    Mobility walks

## 2017-05-23 NOTE — Progress Notes (Signed)
PT Cancellation Note  Patient Details Name: [name] MRN: [id number] DOB: [birth year]   Cancelled Treatment:    Reason Eval/Treat Not Completed: Patient not medically ready, presently on Cardizem drip for afib.Will check back another time when stable.  Claretha Cooper 05/23/2017, 8:42 AM Tresa Endo PT [phone]

## 2017-05-23 NOTE — Progress Notes (Signed)
Patient ID: JAKEL ALPHIN, male   DOB: 01-17-1946, 72 y.o.   MRN: [id number]  PROGRESS NOTE    WELFORD CHRISTMAS  NOM:[id number] DOB: 1945-09-07 DOA: 05/22/2017 PCP: Zada Finders, MD   Brief Narrative:  72 year old male with history of COPD, atrial fibrillation on Eliquis, chronic kidney disease stage III, depression and chronic pain presented with shortness of breath, cough, fever and chills.  He was admitted with COPD exacerbation and atrial fibrillation with rapid ventricular rate and started on Solu-Medrol and Cardizem drip.   Assessment & Plan:   Principal Problem:   COPD with acute exacerbation ([place]) Active Problems:   Major depressive disorder, recurrent episode (HCC)   Atrial fibrillation with RVR (HCC)   CKD (chronic kidney disease) stage 3, GFR 30-59 ml/min (HCC)   Chronic respiratory failure with hypoxia ([place])   1. COPD exacerbation; chronic hypoxic respiratory failure  - Continue duo nebs.  Add Dulera.  Decrease Solu-Medrol to 60 mg IV every 8 hours.  Continue oxygen supplementation -Outpatient follow-up with pulmonary  2. Atrial fibrillation with RVR  - CHADS-VASc is at least 3 (age, CHF, PAD)  - Continue Eliquis  - Continue Cardizem drip.  Cardiology consult.  Patient has a long-standing history of atrial fibrillation with difficult to control heart rate. -Continue heart healthy diet.  Patient is adamant that he will only eat a regular diet and is threatening to leave [place] if he gets continued on heart healthy diet.  I think the patient should continue on heart healthy diet given his current medical condition and comorbidities.  3. CKD stage III  -Creatinine stable.  Monitor  4. Depression  - Stable  - Continue Wellbutrin    DVT prophylaxis: Eliquis Code Status: Partial Family Communication: None at bedside Disposition Plan: Depends on clinical outcome  Consultants: Cardiology  Procedures: None  Antimicrobials: None   Subjective: Patient  seen and examined at bedside.  He gets intermittently very loud and agitated and keeps demanding regular food and states that he will not eat heart healthy food.  He does not want to answer any questions appropriately.  Intermittently states that he should have been transferred to [facility].  Also states that nobody is listening to him or taking care of him.  No overnight fever or vomiting  Objective: Vitals:   05/23/17 0900 05/23/17 0931 05/23/17 1008 05/23/17 1130  BP: (!) 117/92 (!) 94/59 117/71 101/75  Pulse: (!) 51 60 (!) 49 (!) 52  Resp: 12 19 (!) 21 (!) 21  Temp:      TempSrc:      SpO2: 100% 99% 97% 99%  Weight:      Height:        Intake/Output Summary (Last 24 hours) at 05/23/2017 1221 Last data filed at 05/22/2017 2340 Gross per 24 hour  Intake 3 ml  Output -  Net 3 ml   Filed Weights   05/22/17 1833  Weight: 59 kg (130 lb)    Examination:  General exam: Intermittently gets very loud and agitated.  No acute distress Respiratory system: Bilateral decreased breath sound at bases with some scattered crackles Cardiovascular system: S1 & S2 heard, intermittently tachycardic  gastrointestinal system: Abdomen is nondistended, soft and nontender. Normal bowel sounds heard. Extremities: No cyanosis, clubbing, edema    Data Reviewed: I have personally reviewed following labs and imaging studies  CBC: Recent Labs  Lab 05/22/17 1822 05/23/17 0816  WBC 11.5* 7.9  NEUTROABS 8.1* 7.1  HGB 16.9 15.6  HCT 49.3 45.5  MCV 91.3 91.0  PLT 154 185*   Basic Metabolic Panel: Recent Labs  Lab 05/22/17 2126 05/23/17 0816  NA 142 139  K 4.6 4.5  CL 111 109  CO2 23 21*  GLUCOSE 88 184*  BUN 35* 36*  CREATININE 1.38* 1.36*  CALCIUM 9.2 9.4   GFR: Estimated Creatinine Clearance: 40.4 mL/min (A) (by C-G formula based on SCr of 1.36 mg/dL (H)). Liver Function Tests: Recent Labs  Lab 05/22/17 2126  AST 21  ALT 11*  ALKPHOS 72  BILITOT 1.5*  PROT 7.2  ALBUMIN  4.0   No results for input(s): LIPASE, AMYLASE in the last 168 hours. No results for input(s): AMMONIA in the last 168 hours. Coagulation Profile: No results for input(s): INR, PROTIME in the last 168 hours. Cardiac Enzymes: No results for input(s): CKTOTAL, CKMB, CKMBINDEX, TROPONINI in the last 168 hours. BNP (last 3 results) No results for input(s): PROBNP in the last 8760 hours. HbA1C: No results for input(s): HGBA1C in the last 72 hours. CBG: Recent Labs  Lab 05/23/17 0754  GLUCAP 189*   Lipid Profile: No results for input(s): CHOL, HDL, LDLCALC, TRIG, CHOLHDL, LDLDIRECT in the last 72 hours. Thyroid Function Tests: No results for input(s): TSH, T4TOTAL, FREET4, T3FREE, THYROIDAB in the last 72 hours. Anemia Panel: No results for input(s): VITAMINB12, FOLATE, FERRITIN, TIBC, IRON, RETICCTPCT in the last 72 hours. Sepsis Labs: No results for input(s): PROCALCITON, LATICACIDVEN in the last 168 hours.  No results found for this or any previous visit (from the past 240 hour(s)).       Radiology Studies: Dg Chest 2 View  Result Date: 05/22/2017 CLINICAL DATA:  Shortness of breath. EXAM: CHEST  2 VIEW COMPARISON:  10/30/2016 FINDINGS: No cardiomegaly. Stable mediastinal contours that are distorted by leftward displacement. 2 pacer leads into the right ventricle. There is chronic opacity on the left which is primarily from calcified pleural plaques and pleural thickening based on chest CT in 2017. There is also a right-sided pleural plaques posteriorly based on prior CT. Asymmetric lung density from volume loss on the left and hyperinflation on the right. Nodular density over the right lower chest measuring 14 mm, probable nipple shadow but not confirmed on the lateral view. The IMPRESSION: 1. No evidence of active disease when compared to prior. 2. 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 3.  13 mm nodular density over the lower right lung, probable nipple shadow but not confirmed in the lateral projection. After convalescence a follow-up with nipple markers can be obtained. Electronically Signed   By: Monte Fantasia M.D.   On: 05/22/2017 19:49        Scheduled Meds: . apixaban  2.5 mg Oral BID  . aspirin  81 mg Oral Daily  . buPROPion  100 mg Oral BID  . feeding supplement  1 Container Oral TID BM  . ipratropium-albuterol  3 mL Nebulization TID  . methylPREDNISolone (SOLU-MEDROL) injection  60 mg Intravenous Q6H  . metoprolol succinate  100 mg Oral Daily  . metoprolol succinate  50 mg Oral QHS  . rosuvastatin  20 mg Oral q1800  . senna-docusate  2 tablet Oral BID  . sodium chloride flush  3 mL Intravenous Q12H   Continuous Infusions: . diltiazem (CARDIZEM) infusion 5 mg/hr (05/23/17 0054)     LOS: 1 day        Aline August, MD [facility] Pager [phone]  If 7PM-7AM, please contact night-coverage [email or web address] Password [email or web address] 05/23/2017, 12:21 PM

## 2017-05-23 NOTE — ED Notes (Signed)
ADMISSION Provider at bedside. 

## 2017-05-23 NOTE — Progress Notes (Signed)
Palliative Medicine consult noted. Due to high referral volume, there may be a delay seeing this patient. Please call the Palliative Medicine Team office at [phone] if recommendations are needed in the interim.  Thank you for inviting [place] to see this patient.  Marjie Skiff Monai Hindes, RN, BSN, [facility] 05/23/2017 2:53 PM Office [phone]

## 2017-05-23 NOTE — ED Notes (Signed)
PT REQUESTING TO LEAVE BECAUSE HE DID NOT RECEIVE MEAT ON HIS BREAKFAST TRAY. PT IS ON HEART HEALTHY. [facility] UNIT SECRET CALLED DIETARY/ INFORMED BREAKFAST WOULD NO LONGER HAVE MEAT WITH HEART HEALTH AS OPTION. CONTACTED [facility] HOSPITALIST TO INFORM OF PT CURRENT STATUS AND PT'S REQUEST TO CHANGE DIET, BMET. [facility] MD RESPONSE- HE WOULD NOT CHANGE DIET. WAITING CARDIAC CONSULT. PT COULD LEAVE AMA IF HE DID LIKE CARE FOR HIS PLAN. PT MADE AWARE

## 2017-05-23 NOTE — Progress Notes (Signed)
OT Cancellation Note  Patient Details Name: [name] MRN: [id number] DOB: 02-11-1946   Cancelled Treatment:    Reason Eval/Treat Not Completed: Patient not medically ready; presently on Cardizem drip for afib. Will check back as Pt is medically ready and as schedule permits.  Lou Cal, OT Pager [phone] 05/23/2017   Raymondo Band 05/23/2017, 10:05 AM

## 2017-05-23 NOTE — Consult Note (Signed)
Cardiology Consultation:   Patient ID: [name]; [id number]; [birth year]   Admit date: 05/22/2017 Date of Consult: 05/23/2017  Primary Care Provider: Zada Finders, MD Primary Cardiologist: Dr. Rayann Heman  Chief Complaint: SOB/cough  Patient Profile:   [name] is a 72 y.o. male with a hx of noncompliance with medications/follow-up (has not taken meds since August), failure to thrive, difficult to control permanent atrial fib/flutter, tachy-brady syndrome s/p PPM (St. Jude gen change 2010), recurrent DVT, arterial thrombus (prior history of arterial and venous thrombosis including portal vein thrombosis, DVT, and superior mesenteric artery thrombosis, protein-calorie malnutrition, COPD, chronic respiratory failure, CKD stage III, chronic systolic CHF/NICM (normal cors 8381, embolic STEMI in 8403 with moderate calcification on CT), remote alcohol abuse, bipolar disorder, lung nodule, asbestos exposure with pleural plaque, E coli bacteremia, HTN, HLD, hyperthyroidism, who is being seen today for the evaluation of atrial fib at the request of Dr. Starla Link.  History of Present Illness:   He has extremely complicated PMH. He had remote cath in 2003 in setting of low EF showing normal coronaries. Nuc in 2015 was normal. He has failed medical therapy for atrial fibrillation with multaq, amiodarone, and tikoysn.He underwent attempted flutter ablation in 2010 but had presumed occlusion/stenosis of the right and left common femoral veins with inability to obtain access.  He was admitted in 2015 for RLE arterial thrombosis and underwent embolectomy and fasciotomy at that time.He was hospitalized 09/2014 for persistent atrial fibrillation with RVR with subsequent embolization in the setting of noncompliance with oral anticoagulation (subtherapeutic INR) that resulted in STEMI (peak troponin 25), TIA, and lower extremity ischemia requiring embolectomy.  He did not receive left heart catherization due to no  chest pain and down-trending troponins. CT chest revealed moderate coronary artery calcifications. STEMI was felt due to emboli, with consideration of OP stress test recommended which was never pursued. PET 10/2015 was negative for malignancy. Last echo 07/2015 showed EF 30%, diffuse HK, mild AI, mild-mod MR, mild-mod TR, moderate LAE. He has since been followed by Dr. Rayann Heman who has acknowledged the lack of options for his atrial fib. He was previously felt to be a poor candidate for MAZE surgery by our surgeons. At visit 07/2016, Dr. Rayann Heman referred him to Dr. Orvil Feil at [facility] to consider transhepatic ablation of afib or atrial flutter could be considered. AV nodal ablation from jugular access has been felt less ideal given history of recurrent infections and bacteremia. The appointment at [facility] has not happened and the patient had not called back to reschedule f/u with our office either.  The patient cites transportation issues as well as markedly diminished physical capacity to get to his appointments. At baseline he mostly moves around in a wheelchair, occasionally using his walker to transfer otherwise, but ADLs require great difficulty. He receives meals on wheels and family will cook for him, but sometimes the food just sits there because he has no appetite. Remote weights were in the 170s-180s, now gradually down to 130.  He stopped taking all of his medications in August. He got tired of taking all of them, and felt OK, so didn't see the need. He's been followed with patient outreach from [facility] by primary care. Around December he began noticing worsening dyspnea. He had initially planned to see primary care but didn't make it to that appointment before his breathing worsened and he continued to have white phlegm cough with subjective fevers/chills, so he came to the ED. Labs show stable CKD Cr 1.38,  mild leukocytosis 11.5, BNP 122, CXR without active disease, known asbestos related pleural disease, 33m  nodular density on lower right lung suggesting nipple shadow with recommendation for repeat with nipple markers when more stable.  He was noted to be in RVR on admission up to the 170s, given 569mmetoprolol IV x 2, 509mral metoprolol, 14m64m cardizem and started on 5mg/84mr drip. He was continued on home Eliquis. Has previously had blood pressure issues requiring midodrine, which he also has not been taking. HR is presently 90s-105. He is very comfortable. His absolute number one concern at present time is that he does not want a heart healthy diet, only a regular diet, due to already baseline poor appetite. He has had rare "jolt" of chest discomfort but no angina-type CP. No orthopnea, LEE, abd distention, bleeding or syncope.  Past Medical History:  Diagnosis Date  . Alcohol abuse   . Anxiety   . Asbestos exposure   . Avascular necrosis of bones of both hips (HCC)    Status post bilateral hip replacements  . Bipolar disorder (HCC) [place] Chronic systolic heart failure (HCC) [place]. NICM - EF 35% with normal cors 2003. b. Last echo 11/2012 - EF 25-30%.  . CKD (chronic kidney disease), stage III (HCC) [place] 12/[place]/2009 showed no pathological findings. Labs 12/04/2009 include normal ESR, C3, C4; neg ANA; SPEP showed nonspecific increase in the alpha-2 region with no M-spike; UPEP showed no monoclonal free light chains; urine IFE showed polyclonal increase in feree Kappa and/or free Lambda light chains. Baseline Cr reported 1.7-2.5.  . COPMarland Kitchen (chronic obstructive pulmonary disease) (HCC) [place] Depression   . DVT (deep venous thrombosis) (HCC) [place] has hypercoagulability with multiple prior DVTs  . E coli bacteremia   . Erectile dysfunction   . Failure to thrive (0-17) 08/2016   DEHYDRATION  . Gastritis   . GERD (gastroesophageal reflux disease)   . HCAP (healthcare-associated pneumonia) 08/24/2015  . Hydrocele, unspecified   . Hyperlipemia   . Hypertension   . Hyperthyroidism    Likely due  to thyroiditis with possible amiodarone association.  Thyroid scan 08/28/2009 was normal with no focal areas of abnormal increased or decreased activity seen; the uptake of I 131 sodium iodide at 24 hours was 5.7%.  TSH and free T4 normalized by 08/16/2009.  . Intestinal obstruction (HCC) [place] Lung nodule    Chest CT scan on 12/14/2008 showed a nodular opacity at the left lung base felt to most likely represent scarring.  Follow-up chest CT scan on 06/02/2010 showed parenchymal scarring in the left apex, left  lower lobe, and lingula; some of this scarring at the left base had a nodular appearance, unchanged. Chest 09/2012 - stable.  . NICMarland Kitchen (nonischemic cardiomyopathy) (HCC) [place] Noncompliance   . On home oxygen therapy    "3L periodically" (08/24/2015)  . Osteoarthritis   . Permanent atrial fibrillation (HCC) [place]. failed ablation in 2010 due to inability to access/venous occlusions. b. failed multaq, amiodarone, tikosyn. c. has not been felt to be a good candidate for AVN ablation due to recurrent infections/bacteremia history.  . Pleural plaque    H/o asbestos exposure. Chest CT on 06/02/2010 showed stable extensive calcified pleural plaques involving the left hemithorax, consistent with asbestos related pleural disease.  . Portal vein thrombosis   . Protein calorie malnutrition (HCC) [place]  . Spondylosis   . STEMI (  ST elevation myocardial infarction) ([place]) 73/71/0626   a. felt embolic in nature due to noncompliance with anticoagulation, LHC not pursued.  . Tachycardia-bradycardia syndrome [facility])    a. 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 b. St. Jude gen change 2010.  [name] Thrombocytopenia ([place])   . 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  . Weight loss    Normal colonoscopy by Dr. Olevia Perches on 11/06/2010.    Past Surgical History:  Procedure Laterality Date  . CARDIAC CATHETERIZATION  2003   Nl  cors, EF 35%  . CARDIOVERSION N/A 05/27/2013   Procedure: CARDIOVERSION;  Surgeon: Candee Furbish, MD;  Location[facility] ENDOSCOPY;  Service: Cardiovascular;  Laterality: N/A;  . EMBOLECTOMY Right 12/10/2013   Procedure: Right Femoral Embolectomy; Fasciotomy Right Lower Leg with Intraoperative arteriogram;  Surgeon: Rosetta Posner, MD;  Location: [place];  Service: Vascular;  Laterality: Right;  . EMBOLECTOMY Left 10/12/2014   Procedure: Left Femoral Thrombectomy with intraoperative arteriogram;  Surgeon: Rosetta Posner, MD;  Location: [place];  Service: Vascular;  Laterality: Left;  . FASCIOTOMY CLOSURE Right 12/15/2013   Procedure: LEG FASCIOTOMY CLOSURE;  Surgeon: Rosetta Posner, MD;  Location: [place];  Service: Vascular;  Laterality: Right;  . HEMIARTHROPLASTY HIP Left 09/09/2002   bipolar; Dr. [place] Dragon  . INSERT / REPLACE / REMOVE PACEMAKER  02/2003   Dr. Roe Rutherford, St. Jude Medical pulse generator identity SR model [id number] serial [id number]; gen change by Greggory Brandy  . INSERT / REPLACE / REMOVE PACEMAKER  06/17/2008   Lead and generator change w/ St. Jude Medical Tendril ST model [id number] (serial  [id number]).       . INTRAOPERATIVE ARTERIOGRAM Right 12/10/2013   Procedure: INTRA OPERATIVE ARTERIOGRAM;  Surgeon: Rosetta Posner, MD;  Location: [place];  Service: Vascular;  Laterality: Right;  . JOINT REPLACEMENT    . TEE WITHOUT CARDIOVERSION N/A 05/27/2013   Procedure: TRANSESOPHAGEAL ECHOCARDIOGRAM (TEE);  Surgeon: Candee Furbish, MD;  Location: [facility] ENDOSCOPY;  Service: Cardiovascular;  Laterality: N/A;  . TOTAL HIP ARTHROPLASTY Right 03/08/2008    By Dr. Hiram Comber     Inpatient Medications: Scheduled Meds: . apixaban  2.5 mg Oral BID  . aspirin  81 mg Oral Daily  . buPROPion  100 mg Oral BID  . feeding supplement  1 Container Oral TID BM  . ipratropium-albuterol  3 mL Nebulization TID  . methylPREDNISolone (SOLU-MEDROL) injection  60 mg Intravenous Q6H  . metoprolol succinate  100 mg Oral Daily  . rosuvastatin   20 mg Oral q1800  . senna-docusate  2 tablet Oral BID  . sodium chloride flush  3 mL Intravenous Q12H   Continuous Infusions: . diltiazem (CARDIZEM) infusion 5 mg/hr (05/23/17 0054)   PRN Meds: acetaminophen **OR** acetaminophen, albuterol, labetalol, ondansetron **OR** ondansetron (ZOFRAN) IV, oxyCODONE-acetaminophen **AND** oxyCODONE, polyethylene glycol  Home Meds: Prior to Admission medications   Medication Sig Start Date End Date Taking? Authorizing Provider  albuterol (VENTOLIN HFA) 108 (90 BASE) MCG/ACT inhaler Inhale 2 puffs into the lungs every 6 (six) hours as needed for wheezing or shortness of breath. 01/06/15  Yes Aldine Contes, MD  oxyCODONE-acetaminophen (PERCOCET) 10-325 MG tablet Take 1 tablet by mouth every 4 (four) hours as needed for pain. Do not take more than 5 tablets in 24 hours. 03/05/17 03/04/18 Yes Zada Finders, MD  tiotropium (SPIRIVA) 18 MCG inhalation capsule Place 1 capsule (18 mcg total) into inhaler and  inhale daily. 01/06/15  Yes Aldine Contes, MD  apixaban (ELIQUIS) 2.5 MG TABS tablet Take 1 tablet (2.5 mg total) by mouth 2 (two) times daily. Patient not taking: Reported on 05/22/2017 02/12/17   Zada Finders, MD  aspirin 81 MG chewable tablet Chew 1 tablet (81 mg total) by mouth daily. Patient not taking: Reported on 05/22/2017 02/12/17   Zada Finders, MD  buPROPion [facility] SR) 100 MG 12 hr tablet Take 1 tablet (100 mg total) by mouth 2 (two) times daily. Patient not taking: Reported on 05/22/2017 02/12/17   Zada Finders, MD  feeding supplement (BOOST / RESOURCE BREEZE) LIQD Take 1 Container by mouth 3 (three) times daily between meals. Patient not taking: Reported on 05/22/2017 09/09/16   Burgess Estelle, MD  metoprolol succinate (TOPROL-XL) 50 MG 24 hr tablet Take 100 mg in the morning and 50 mg in the evening. Take with or immediately following a meal. Patient not taking: Reported on 05/22/2017 02/12/17   Zada Finders, MD  midodrine (PROAMATINE) 5 MG  tablet Take 1 tablet (5 mg total) by mouth 3 (three) times daily with meals. Patient not taking: Reported on 05/22/2017 02/12/17   Zada Finders, MD  polyethylene glycol [facility] Floria Raveling) packet Take 17 g by mouth daily as needed. Patient not taking: Reported on 05/22/2017 02/12/17   Zada Finders, MD  rosuvastatin (CRESTOR) 20 MG tablet TAKE 1 TABLET EVERY DAY AT 6 PM Patient not taking: Reported on 05/22/2017 02/12/17   Zada Finders, MD  senna-docusate (SENOKOT-S) 8.6-50 MG tablet Take 2 tablets by mouth 2 (two) times daily. Patient not taking: Reported on 05/22/2017 02/12/17   Zada Finders, MD    Allergies:    Allergies  Allergen Reactions  . Amiodarone Other (See Comments)    Thyroiditis in the setting of amiodarone use  . Penicillins Rash and Other (See Comments)    Has patient had a PCN reaction causing immediate rash, facial/tongue/throat swelling, SOB or lightheadedness with hypotension: Yes Has patient had a PCN reaction causing severe rash involving mucus membranes or skin necrosis: No Has patient had a PCN reaction that required hospitalization No Has patient had a PCN reaction occurring within the last 10 years: No If all of the above answers are "NO", then may proceed with Cephalosporin use.    Social History:   Social History   Socioeconomic History  . Marital status: Widowed    Spouse name: Not on file  . Number of children: 10  . Years of education: Not on file  . Highest education level: Not on file  Social Needs  . Financial resource strain: Not on file  . Food insecurity - worry: Not on file  . Food insecurity - inability: Not on file  . Transportation needs - medical: Not on file  . Transportation needs - non-medical: Not on file  Occupational History  . Occupation: RETIRED    Employer: RETIRED  Tobacco Use  . Smoking status: Current Some Day Smoker    Packs/day: 0.25    Years: 54.00    Pack years: 13.50    Types: Cigarettes    Start date: 49  .  Smokeless tobacco: Current User  . Tobacco comment: [name] back  Substance and Sexual Activity  . Alcohol use: No    Alcohol/week: 0.0 oz    Comment: History of ETOH Abuse."  . Drug use: Yes    Frequency: 1.0 times per week    Types: Marijuana    Comment: Every other day.   [name]  Sexual activity: Not Currently  Other Topics Concern  . Not on file  Social History Narrative   Retired Architect N56 yrs here in [place] 1 pack last 1.5 days tobacco, smokes marijuana    Denies EtOH x 4 years (on 10/12/12)   9 kids    From [place]   [place] Veteran    12 grade education           Family History:   The patient's family history includes Asthma in his mother; Cancer in his brother and mother; Coronary artery disease in his mother; Heart attack in his brother and sister; Heart disease in his brother; Hypertension in his mother; Stroke in his mother. There is no history of Colon cancer, Lung cancer, or Prostate cancer.  ROS:  Please see the history of present illness.  All other ROS reviewed and negative.     Physical Exam/Data:   Vitals:   05/23/17 0900 05/23/17 0931 05/23/17 1008 05/23/17 1130  BP: (!) 117/92 (!) 94/59 117/71 101/75  Pulse: (!) 51 60 (!) 49 (!) 52  Resp: 12 19 (!) 21 (!) 21  Temp:      TempSrc:      SpO2: 100% 99% 97% 99%  Weight:      Height:        Intake/Output Summary (Last 24 hours) at 05/23/2017 1205 Last data filed at 05/22/2017 2340 Gross per 24 hour  Intake 3 ml  Output -  Net 3 ml   Filed Weights   05/22/17 1833  Weight: 130 lb (59 kg)   Body mass index is 16.25 kg/m.  General: Frail, cachectic appearing AAM in no acute distress. Head: Normocephalic, atraumatic, sclera non-icteric, no xanthomas, nares are without discharge.  Neck: Negative for carotid bruits. JVD not elevated. Lungs: Diffuse rhonchi with moderate BS bilaterally, no rales or wheezing. Breathing is unlabored. Heart: Irregularly irregular, mildly tachycardic with S1 S2. No  murmurs, rubs, or gallops appreciated. Abdomen: Soft, non-tender, non-distended with normoactive bowel sounds. No hepatomegaly. No rebound/guarding. No obvious abdominal masses. Msk:  Diffuse atrophy Extremities: No clubbing or cyanosis. No edema.  Distal pedal pulses are 2+ and equal bilaterally. Neuro: Alert and oriented X 3. No facial asymmetry. No focal deficit. Moves all extremities spontaneously. Psych:  Responds to questions appropriately with a normal affect.  EKG:  The EKG was personally reviewed and demonstrates atrial fib 123bpm with LVH and repolarization abnormalities, no acute ST-T changes   Laboratory Data:  Chemistry Recent Labs  Lab 05/22/17 2126 05/23/17 0816  NA 142 139  K 4.6 4.5  CL 111 109  CO2 23 21*  GLUCOSE 88 184*  BUN 35* 36*  CREATININE 1.38* 1.36*  CALCIUM 9.2 9.4  GFRNONAA 49* 50*  GFRAA 57* 58*  ANIONGAP 8 9    Recent Labs  Lab 05/22/17 2126  PROT 7.2  ALBUMIN 4.0  AST 21  ALT 11*  ALKPHOS 72  BILITOT 1.5*   Hematology Recent Labs  Lab 05/22/17 1822 05/23/17 0816  WBC 11.5* 7.9  RBC 5.40 5.00  HGB 16.9 15.6  HCT 49.3 45.5  MCV 91.3 91.0  MCH 31.3 31.2  MCHC 34.3 34.3  RDW 15.0 15.0  PLT 154 140*   Cardiac EnzymesNo results for input(s): TROPONINI in the last 168 hours. No results for input(s): TROPIPOC in the last 168 hours.  BNP Recent Labs  Lab 05/22/17 1822  BNP 122.7*    DDimer No results for input(s): DDIMER in the  last 168 hours.  Radiology/Studies:  Dg Chest 2 View  Result Date: 05/22/2017 CLINICAL DATA:  Shortness of breath. EXAM: CHEST  2 VIEW COMPARISON:  10/30/2016 FINDINGS: No cardiomegaly. Stable mediastinal contours that are distorted by leftward displacement. 2 pacer leads into the right ventricle. There is chronic opacity on the left which is primarily from calcified pleural plaques and pleural thickening based on chest CT in 2017. There is also a right-sided pleural plaques posteriorly based on prior CT.  Asymmetric lung density from volume loss on the left and hyperinflation on the right. Nodular density over the right lower chest measuring 14 mm, probable nipple shadow but not confirmed on the lateral view. The IMPRESSION: 1. No evidence of active disease when compared to prior. 2. 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 3. 13 mm nodular density over the lower right lung, probable nipple shadow but not confirmed in the lateral projection. After convalescence a follow-up with nipple markers can be obtained. Electronically Signed   By: Monte Fantasia M.D.   On: 05/22/2017 19:49    Assessment and Plan:   1. Acute exacerbation of COPD with chronic hypoxic respiratory failure - resp panel pending. Being treated with systemic steroids (pt reports significant improvement with first dose), nebs, and is following infective markers. Sputum cx pending. Given SIRS criteria with tachycardia, borderline blood pressure and prior bacteremia will also obtain blood cultures.  2. Difficult to control permanent atrial fib/flutter, h/o PPM implantation - see above - previously failed multiple antiarrhythmics, ablation, does not wish to pursue tx at [facility] for transhepatic ablation, not a good candidate for AVN ablation due to prior bacteremia and recurrent infections. Has not taken any meds since August. HR now appropriate for level of illness after 86m oral metoprolol, 532mIV metoprolol x 2, diltiazem 1060molus and now continued on 5mg80mur drip. At home he was supposed to be on metoprolol 100mg55m/50mg 29m He is only ordered for 100mg Q76mere. Wrote care order to make sure he gets AM dose (not yet given), and will resume home PM dose as well. Continue low dose diltiazem drip for now with goal of titrating off completely as metoprolol gets back on board (less ideal given his LV dysfunction). Continue apixaban - weight and Cr  qualify him for lower dose. We had a long discussion about his trajectory, particularly in setting of noncompliance, poor appetite, poor oral intake, poor mobility. With regards to code status, he is OK with use of ACLS meds, CPAP/Bipap/noninvasive ventilation, but does not wish to have CPR, defibrillation, or intubation. I updated the order in the computer. Ultimately I wonder if he would benefit from a palliative care consult to cement goals of care and determine if he would qualify for hospice care in the home. The patient definitely says he now understands why his meds are important and is willing to take them, but also affirms that his focus is more on comfort and symptom management from day-to-day rather than a bigger, aggressive picture. Consistent with this approach, for example, is the fact that he wants nothing to do with a heart healthy diet and gets emotionally upset when expressing desire for regular diet because of his already poor appetite. I will honor this request, and place 2L fluid restriction modifier.  3. Chronic systolic CHF/NICM - does not appear volume overloaded at present. Not on home diuretics. Just follow for now. Urine at bedside looks concentrated. Given  that he wishes against DCCV/defib, ICD is not indicated.  4. H/o moderate coronary calcifications, prior STEMI in 6948 felt embolic in nature (no LHC) - will trend troponins for prognostic implications given prior history of marked troponin elevation in previous encounters with noncompliance. Continue aspirin, resume home beta blocker above, continue statin.  For questions or updates, please contact [place] Please consult [email or web address] for contact info under Cardiology/STEMI.    Signed, Charlie Pitter, PA-C  05/23/2017 12:05 PM   I have personally seen and examined this patient with Melina Copa, PA. I agree with the assessment and plan as outlined above. He is a complex patient with history of medication non-compliance.  He has been followed by Dr. Rayann Heman for atrial fibrillation but he has not followed up as advised over the years. He has been off of all medications. Agree with plan for rate control of atrial fib. Resume anti-coagulation at home. Volume status is ok. He does not want aggressive measures. We will follow with you.   Lauree Chandler 05/23/2017 1:27 PM

## 2017-05-24 LAB — BASIC METABOLIC PANEL
ANION GAP: 10 (ref 5–15)
BUN: 38 mg/dL — AB (ref 6–20)
CO2: 22 mmol/L (ref 22–32)
Calcium: 9 mg/dL (ref 8.9–10.3)
Chloride: 106 mmol/L (ref 101–111)
Creatinine, Ser: 1.62 mg/dL — ABNORMAL HIGH (ref 0.61–1.24)
GFR calc Af Amer: 47 mL/min — ABNORMAL LOW (ref >60)
GFR calc non Af Amer: 40 mL/min — ABNORMAL LOW (ref >60)
GLUCOSE: 134 mg/dL — AB (ref 65–99)
POTASSIUM: 5 mmol/L (ref 3.5–5.1)
Sodium: 138 mmol/L (ref 135–145)

## 2017-05-24 LAB — CBC
HCT: 40.4 % (ref 39.0–52.0)
HEMOGLOBIN: 13.3 g/dL (ref 13.0–17.0)
MCH: 30.1 pg (ref 26.0–34.0)
MCHC: 32.9 g/dL (ref 30.0–36.0)
MCV: 91.4 fL (ref 78.0–100.0)
PLATELETS: 149 10*3/uL — AB (ref 150–400)
RBC: 4.42 MIL/uL (ref 4.22–5.81)
RDW: 15 % (ref 11.5–15.5)
WBC: 10 10*3/uL (ref 4.0–10.5)

## 2017-05-24 LAB — GLUCOSE, CAPILLARY: Glucose-Capillary: 125 mg/dL — ABNORMAL HIGH (ref 65–99)

## 2017-05-24 LAB — TROPONIN I: Troponin I: <0.03 ng/mL (ref <0.03)

## 2017-05-24 MED ORDER — ALUM & MAG HYDROXIDE-SIMETH 200-200-20 MG/5ML PO SUSP
15.0000 mL | ORAL | Status: DC | PRN
  Administered 2017-05-24 – 2017-05-26 (×2): 15 mL via ORAL
  Filled 2017-05-24 (×2): qty 30

## 2017-05-24 MED ORDER — DILTIAZEM HCL-DEXTROSE 100-5 MG/100ML-% IV SOLN (PREMIX)
5.0000 mg/h | INTRAVENOUS | Status: DC
  Administered 2017-05-24 – 2017-05-25 (×2): 5 mg/h via INTRAVENOUS
  Filled 2017-05-24: qty 100

## 2017-05-24 MED ORDER — NICOTINE 21 MG/24HR TD PT24
21.0000 mg | MEDICATED_PATCH | Freq: Every day | TRANSDERMAL | Status: DC
  Administered 2017-05-24 – 2017-05-27 (×4): 21 mg via TRANSDERMAL
  Filled 2017-05-24 (×4): qty 1

## 2017-05-24 MED ORDER — ORAL CARE MOUTH RINSE
15.0000 mL | Freq: Two times a day (BID) | OROMUCOSAL | Status: DC
  Administered 2017-05-24 – 2017-05-27 (×5): 15 mL via OROMUCOSAL

## 2017-05-24 MED ORDER — PREDNISONE 20 MG PO TABS
50.0000 mg | ORAL_TABLET | Freq: Every day | ORAL | Status: AC
  Administered 2017-05-25 – 2017-05-26 (×2): 50 mg via ORAL
  Filled 2017-05-24 (×2): qty 2

## 2017-05-24 MED ORDER — GI COCKTAIL ~~LOC~~
30.0000 mL | Freq: Two times a day (BID) | ORAL | Status: DC | PRN
  Administered 2017-05-25: 30 mL via ORAL
  Filled 2017-05-24: qty 30

## 2017-05-24 NOTE — Progress Notes (Signed)
OT Cancellation Note  Patient Details Name: [name] MRN: [id number] DOB: 1945-08-04   Cancelled Treatment:    Reason Eval/Treat Not Completed: Other (comment)   OT deferred this date at request of RN - pt continues on Cardizem drip.  Will follow.   Mickel Baas [place], [place] 05/24/2017, 12:31 PM

## 2017-05-24 NOTE — Progress Notes (Signed)
Internal Medicine MD paged about pt in A fib (chronic) w/HR 100s-130s. Verbal orders received to restart Cardizem drip.   Will continue to monitor.

## 2017-05-24 NOTE — Progress Notes (Signed)
PT Cancellation Note  Patient Details Name: [name] MRN: [id number] DOB: 11/08/1945   Cancelled Treatment:     PT deferred this date at request of RN - pt continues on Cardizem drip.  Will follow.   Ruthe Roemer 05/24/2017, 12:23 PM

## 2017-05-24 NOTE — Progress Notes (Signed)
Progress Note  Patient Name: [name] Date of Encounter: 05/24/2017  Primary Cardiologist Allred  Primary Electrophysiologist:  JA   Patient Profile     72 y.o. male admitted with shortness of breath and found to have atrial fibrillation with a rapid rate .  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 apixaban  2.5 mg Oral BID  . aspirin  81 mg Oral Daily  . buPROPion  100 mg Oral BID  . feeding supplement  1 Container Oral TID BM  . ipratropium-albuterol  3 mL Nebulization TID  . methylPREDNISolone (SOLU-MEDROL) injection  60 mg Intravenous Q8H  . metoprolol succinate  100 mg Oral Daily  . metoprolol succinate  50 mg Oral QHS  . mometasone-formoterol  2 puff Inhalation BID  . rosuvastatin  20 mg Oral q1800  . senna-docusate  2 tablet Oral BID  . sodium chloride flush  3 mL Intravenous Q12H   Continuous Infusions: . diltiazem (CARDIZEM) infusion 5 mg/hr (05/23/17 1801)   PRN Meds: acetaminophen **OR** acetaminophen, albuterol, alum & mag hydroxide-simeth, gi cocktail, ondansetron **OR** ondansetron (ZOFRAN) IV, oxyCODONE-acetaminophen **AND** oxyCODONE, polyethylene glycol   Vital Signs    Vitals:   05/24/17 0000 05/24/17 0100 05/24/17 0200 05/24/17 0346  BP:  95/70    Pulse: (!) 56 74 76   Resp: (!) 21 14 (!) 22   Temp:    98 F (36.7 C)  TempSrc:    Oral  SpO2: 96% 97% 95%   Weight:      Height:        Intake/Output  Summary (Last 24 hours) at 05/24/2017 0759 Last data filed at 05/23/2017 1801 Gross per 24 hour  Intake 260 ml  Output 800 ml  Net -540 ml   Filed Weights   05/22/17 1833  Weight: 130 lb (59 kg)    Telemetry    afib contolled VR - Personally Reviewed  ECG   d  Physical Exam   GEN: No acute distress.   Neck: JVD flat Cardiac: Irregular rate and rhythm murmurs, rubs, or gallops.  Respiratory: Clear to auscultation bilaterally. GI: Soft, nontender, non-distended  MS:  edema; No deformity. Neuro:  Nonfocal  Psych: Normal affect  Skin Warm and dry   Labs    Chemistry Recent Labs  Lab 05/22/17 2126 05/23/17 0816 05/24/17 0013  NA 142 139 138  K 4.6 4.5 5.0  CL 111 109 106  CO2 23 21* 22  GLUCOSE 88 184* 134*  BUN 35* 36* 38*  CREATININE 1.38* 1.36* 1.62*  CALCIUM 9.2 9.4 9.0  PROT 7.2  --   --   ALBUMIN 4.0  --   --   AST 21  --   --   ALT 11*  --   --   ALKPHOS 72  --   --   BILITOT 1.5*  --   --   GFRNONAA 49* 50* 40*  GFRAA 57* 58* 47*  ANIONGAP 8 9 10      Hematology Recent Labs  Lab 05/22/17 1822  05/23/17 0816 05/24/17 0013  WBC 11.5* 7.9 10.0  RBC 5.40 5.00 4.42  HGB 16.9 15.6 13.3  HCT 49.3 45.5 40.4  MCV 91.3 91.0 91.4  MCH 31.3 31.2 30.1  MCHC 34.3 34.3 32.9  RDW 15.0 15.0 15.0  PLT 154 140* 149*    Cardiac Enzymes Recent Labs  Lab 05/23/17 1254 05/23/17 1853 05/24/17 0013  TROPONINI <0.03 <0.03 <0.03   No results for input(s): TROPIPOC in the last 168 hours.   BNP Recent Labs  Lab 05/22/17 1822  BNP 122.7*     DDimer No results for input(s): DDIMER in the last 168 hours.   Radiology    Dg Chest 2 View  Result Date: 05/22/2017 CLINICAL DATA:  Shortness of breath. EXAM: CHEST  2 VIEW COMPARISON:  10/30/2016 FINDINGS: No cardiomegaly. Stable mediastinal contours that are distorted by leftward displacement. 2 pacer leads into the right ventricle. There is chronic opacity on the left which is primarily from calcified pleural  plaques and pleural thickening based on chest CT in 2017. There is also a right-sided pleural plaques posteriorly based on prior CT. Asymmetric lung density from volume loss on the left and hyperinflation on the right. Nodular density over the right lower chest measuring 14 mm, probable nipple shadow but not confirmed on the lateral view. The IMPRESSION: 1. No evidence of active disease when compared to prior. 2. 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 3. 13 mm nodular density over the lower right lung, probable nipple shadow but not confirmed in the lateral projection. After convalescence a follow-up with nipple markers can be obtained. Electronically Signed   By: Monte Fantasia M.D.   On: 05/22/2017 19:49    Cardiac Studies     Device Interrogation    Echo EF 07/1728%   Assessment & Plan  Atrial fibrillation with a rapid ventricular rate  COPD-acute exacerbation  Poor compliance  Renal insufficiency acute on chronic grade 3  LV dysfunction thought rate related  DVT/arterial thrombosis apparently underlying recommendation for adjunctive aspirin  Venous occlusive disease    Wonder to what degree he is cardiomyopathy is rate related associated with poor compliance.  In any case, his heart rate is doing much better now.  His blood pressure is low which is a challenge as he is struggling with lightheadedness.  I will back down on his a.m. metoprolol if the issue persists.  Signed, Virl Axe, MD  05/24/2017, 7:59 AM

## 2017-05-24 NOTE — Progress Notes (Signed)
CSW received consult for COPD Gold Protocol, though patient does not meet qualifications (only 1 admission within the past 6 months).  CSW signing off.   Abundio Miu, [place] Social Worker [facility] Cell#: [phone]

## 2017-05-24 NOTE — Progress Notes (Signed)
PROGRESS NOTE    [name]  TKP:[id number] DOB: [birth year] DOA: 05/22/2017 PCP: Zada Finders, MD   Brief Narrative: 72 year old male with history of COPD, atrial fibrillation on Eliquis, chronic kidney disease stage III, depression and chronic pain presented with shortness of breath, cough, fever and chills.  He was admitted with COPD exacerbation and atrial fibrillation with rapid ventricular rate and started on Solu-Medrol and Cardizem drip.  Patient is noncompliant with medication.  Assessment & Plan:   #COPD exacerbation with chronic respiratory failure with hypoxia: Patient is clinically improved.  Change to oral prednisone.  Continue bronchodilators.  Continue supportive care.  #Atrial fibrillation with RVR in the setting of noncompliance with medication: Patient reported that he was not taking medicine since August because of pills burden.  He has a history of noncompliance with the medication. -Currently on Cardizem drip.  Since heart rate is well controlled on metoprolol.  I will discontinue diltiazem.  Patient also with lower blood pressure.  Continue to monitor.  On Eliquis for anticoagulation.  Cardiology consult appreciated.  #Chronic kidney disease stage III: Stable.  Serum creatinine level around baseline.  Monitor BMP.  #Anxiety depression: Continue home medication.  Mood is stable.  PT OT and case manager evaluation. Continue supportive care.   DVT prophylaxis: Eliquis Code Status:partial DNR Family Communication:no family at bedside Disposition Plan:admitted    Consultants:   cardiology  Procedures:none Antimicrobials:none  Subjective: Seen and examined at bedside.  Shortness of breath is better.  Denied nausea vomiting chest pain.   Objective: Vitals:   05/24/17 0800 05/24/17 0830 05/24/17 0908 05/24/17 1200  BP:   92/62   Pulse:  (!) 53 77   Resp:  (!) 25    Temp: 97.8 F (36.6 C)   97.9 F (36.6 C)  TempSrc: Oral   Oral  SpO2:  97%      Weight:      Height:        Intake/Output Summary (Last 24 hours) at 05/24/2017 1339 Last data filed at 05/24/2017 1001 Gross per 24 hour  Intake 260 ml  Output 950 ml  Net -690 ml   Filed Weights   05/22/17 1833  Weight: 59 kg (130 lb)    Examination:  General exam: Appears calm and comfortable  Respiratory system: Clear to auscultation. Respiratory effort normal. No wheezing or crackle Cardiovascular system: Irregularly irregular, S1-S2 normal.  No pedal edema.. Gastrointestinal system: Abdomen is nondistended, soft and nontender. Normal bowel sounds heard. Central nervous system: Alert and oriented. No focal neurological deficits. Extremities: Symmetric 5 x 5 power. Skin: No rashes, lesions or ulcers Psychiatry: Judgement and insight appear normal. Mood & affect appropriate.     Data Reviewed: I have personally reviewed following labs and imaging studies  CBC: Recent Labs  Lab 05/22/17 1822 05/23/17 0816 05/24/17 0013  WBC 11.5* 7.9 10.0  NEUTROABS 8.1* 7.1  --   HGB 16.9 15.6 13.3  HCT 49.3 45.5 40.4  MCV 91.3 91.0 91.4  PLT 154 140* 517*   Basic Metabolic Panel: Recent Labs  Lab 05/22/17 2126 05/23/17 0816 05/24/17 0013  NA 142 139 138  K 4.6 4.5 5.0  CL 111 109 106  CO2 23 21* 22  GLUCOSE 88 184* 134*  BUN 35* 36* 38*  CREATININE 1.38* 1.36* 1.62*  CALCIUM 9.2 9.4 9.0   GFR: Estimated Creatinine Clearance: 33.9 mL/min (A) (by C-G formula based on SCr of 1.62 mg/dL (H)). Liver Function Tests: Recent Labs  Lab 05/22/17 2126  AST 21  ALT 11*  ALKPHOS 72  BILITOT 1.5*  PROT 7.2  ALBUMIN 4.0   No results for input(s): LIPASE, AMYLASE in the last 168 hours. No results for input(s): AMMONIA in the last 168 hours. Coagulation Profile: No results for input(s): INR, PROTIME in the last 168 hours. Cardiac Enzymes: Recent Labs  Lab 05/23/17 1254 05/23/17 1853 05/24/17 0013  TROPONINI <0.03 <0.03 <0.03   BNP (last 3 results) No results for  input(s): PROBNP in the last 8760 hours. HbA1C: No results for input(s): HGBA1C in the last 72 hours. CBG: Recent Labs  Lab 05/23/17 0754 05/24/17 0758  GLUCAP 189* 125*   Lipid Profile: No results for input(s): CHOL, HDL, LDLCALC, TRIG, CHOLHDL, LDLDIRECT in the last 72 hours. Thyroid Function Tests: Recent Labs    05/23/17 1255  TSH 0.767   Anemia Panel: No results for input(s): VITAMINB12, FOLATE, FERRITIN, TIBC, IRON, RETICCTPCT in the last 72 hours. Sepsis Labs: No results for input(s): PROCALCITON, LATICACIDVEN in the last 168 hours.  Recent Results (from the past 240 hour(s))  Culture, blood (Routine X 2) w Reflex to ID Panel     Status: None (Preliminary result)   Collection Time: 05/23/17 12:55 PM  Result Value Ref Range Status   Specimen Description BLOOD LEFT FOREARM  Final   Special Requests   Final    BOTTLES DRAWN AEROBIC AND ANAEROBIC Blood Culture adequate volume   Culture   Final    NO GROWTH < 24 HOURS Performed at [facility], [address] [place], [place] [address]    Report Status PENDING  Incomplete  Respiratory Panel by PCR     Status: None   Collection Time: 05/23/17  3:17 PM  Result Value Ref Range Status   Adenovirus NOT DETECTED NOT DETECTED Final   Coronavirus 229E NOT DETECTED NOT DETECTED Final   Coronavirus HKU1 NOT DETECTED NOT DETECTED Final   Coronavirus NL63 NOT DETECTED NOT DETECTED Final   Coronavirus OC43 NOT DETECTED NOT DETECTED Final   Metapneumovirus NOT DETECTED NOT DETECTED Final   Rhinovirus / Enterovirus NOT DETECTED NOT DETECTED Final   Influenza A NOT DETECTED NOT DETECTED Final   Influenza B NOT DETECTED NOT DETECTED Final   Parainfluenza Virus 1 NOT DETECTED NOT DETECTED Final   Parainfluenza Virus 2 NOT DETECTED NOT DETECTED Final   Parainfluenza Virus 3 NOT DETECTED NOT DETECTED Final   Parainfluenza Virus 4 NOT DETECTED NOT DETECTED Final   Respiratory Syncytial Virus NOT DETECTED NOT DETECTED Final    Bordetella pertussis NOT DETECTED NOT DETECTED Final   Chlamydophila pneumoniae NOT DETECTED NOT DETECTED Final   Mycoplasma pneumoniae NOT DETECTED NOT DETECTED Final  MRSA PCR Screening     Status: None   Collection Time: 05/23/17  3:20 PM  Result Value Ref Range Status   MRSA by PCR NEGATIVE NEGATIVE Final    Comment:        The GeneXpert MRSA Assay (FDA approved for NASAL specimens only), is one component of a comprehensive MRSA colonization surveillance program. It is not intended to diagnose MRSA infection nor to guide or monitor treatment for MRSA infections.   Culture, blood (Routine X 2) w Reflex to ID Panel     Status: None (Preliminary result)   Collection Time: 05/23/17  9:02 PM  Result Value Ref Range Status   Specimen Description BLOOD RIGHT ANTECUBITAL  Final   Special Requests   Final    BOTTLES DRAWN AEROBIC AND ANAEROBIC Blood  Culture results may not be optimal due to an inadequate volume of blood received in culture bottles   Culture   Final    NO GROWTH < 12 HOURS Performed at [facility] [address], [place], [place] [address]    Report Status PENDING  Incomplete         Radiology Studies: Dg Chest 2 View  Result Date: 05/22/2017 CLINICAL DATA:  Shortness of breath. EXAM: CHEST  2 VIEW COMPARISON:  10/30/2016 FINDINGS: No cardiomegaly. Stable mediastinal contours that are distorted by leftward displacement. 2 pacer leads into the right ventricle. There is chronic opacity on the left which is primarily from calcified pleural plaques and pleural thickening based on chest CT in 2017. There is also a right-sided pleural plaques posteriorly based on prior CT. Asymmetric lung density from volume loss on the left and hyperinflation on the right. Nodular density over the right lower chest measuring 14 mm, probable nipple shadow but not confirmed on the lateral view. The IMPRESSION: 1. No evidence of active disease when compared to prior. 2. 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 3. 13 mm nodular density over the lower right lung, probable nipple shadow but not confirmed in the lateral projection. After convalescence a follow-up with nipple markers can be obtained. Electronically Signed   By: Monte Fantasia M.D.   On: 05/22/2017 19:49        Scheduled Meds: . apixaban  2.5 mg Oral BID  . aspirin  81 mg Oral Daily  . buPROPion  100 mg Oral BID  . feeding supplement  1 Container Oral TID BM  . ipratropium-albuterol  3 mL Nebulization TID  . mouth rinse  15 mL Mouth Rinse BID  . metoprolol succinate  100 mg Oral Daily  . metoprolol succinate  50 mg Oral QHS  . mometasone-formoterol  2 puff Inhalation BID  . [START ON 05/25/2017] predniSONE  50 mg Oral Q breakfast  . rosuvastatin  20 mg Oral q1800  . senna-docusate  2 tablet Oral BID  . sodium chloride flush  3 mL Intravenous Q12H   Continuous Infusions:   LOS: 2 days    Dron Tanna Furry, MD [facility] Pager [phone]  If 7PM-7AM, please contact night-coverage [email or web address] Password [email or web address] 05/24/2017, 1:39 PM

## 2017-05-25 LAB — GLUCOSE, CAPILLARY: GLUCOSE-CAPILLARY: 109 mg/dL — AB (ref 65–99)

## 2017-05-25 LAB — BASIC METABOLIC PANEL
ANION GAP: 6 (ref 5–15)
BUN: 40 mg/dL — ABNORMAL HIGH (ref 6–20)
CALCIUM: 8.7 mg/dL — AB (ref 8.9–10.3)
CO2: 23 mmol/L (ref 22–32)
CREATININE: 1.46 mg/dL — AB (ref 0.61–1.24)
Chloride: 104 mmol/L (ref 101–111)
GFR, EST AFRICAN AMERICAN: 53 mL/min — AB (ref >60)
GFR, EST NON AFRICAN AMERICAN: 46 mL/min — AB (ref >60)
Glucose, Bld: 144 mg/dL — ABNORMAL HIGH (ref 65–99)
Potassium: 4.9 mmol/L (ref 3.5–5.1)
SODIUM: 133 mmol/L — AB (ref 135–145)

## 2017-05-25 MED ORDER — ADULT MULTIVITAMIN W/MINERALS CH
1.0000 | ORAL_TABLET | Freq: Every day | ORAL | Status: DC
  Administered 2017-05-25 – 2017-05-27 (×3): 1 via ORAL
  Filled 2017-05-25 (×3): qty 1

## 2017-05-25 MED ORDER — METOPROLOL SUCCINATE ER 50 MG PO TB24
50.0000 mg | ORAL_TABLET | Freq: Every day | ORAL | Status: DC
  Administered 2017-05-25 – 2017-05-27 (×3): 50 mg via ORAL
  Filled 2017-05-25: qty 2
  Filled 2017-05-25: qty 1
  Filled 2017-05-25: qty 2

## 2017-05-25 MED ORDER — DIPHENHYDRAMINE HCL 25 MG PO CAPS
25.0000 mg | ORAL_CAPSULE | Freq: Three times a day (TID) | ORAL | Status: DC | PRN
  Administered 2017-05-25 – 2017-05-27 (×4): 25 mg via ORAL
  Filled 2017-05-25 (×4): qty 1

## 2017-05-25 MED ORDER — DILTIAZEM HCL 30 MG PO TABS
30.0000 mg | ORAL_TABLET | Freq: Four times a day (QID) | ORAL | Status: DC
  Administered 2017-05-25 – 2017-05-26 (×6): 30 mg via ORAL
  Filled 2017-05-25 (×6): qty 1

## 2017-05-25 MED ORDER — GI COCKTAIL ~~LOC~~
30.0000 mL | Freq: Two times a day (BID) | ORAL | Status: DC | PRN
  Administered 2017-05-25 – 2017-05-26 (×2): 30 mL via ORAL
  Filled 2017-05-25 (×3): qty 30

## 2017-05-25 NOTE — Progress Notes (Signed)
PROGRESS NOTE    [name]  BTD:[id number] DOB: 1945-12-18 DOA: 05/22/2017 PCP: Zada Finders, MD   Brief Narrative: 72 year old male with history of COPD, atrial fibrillation on Eliquis, chronic kidney disease stage III, depression and chronic pain presented with shortness of breath, cough, fever and chills.  He was admitted with COPD exacerbation and atrial fibrillation with rapid ventricular rate and started on Solu-Medrol and Cardizem drip.  Patient is noncompliant with medication.  Assessment & Plan:   #COPD exacerbation with chronic respiratory failure with hypoxia: Continue oral prednisone.  Continue bronchodilators.  Continue supportive care. -Respiratory status is stable.  #Atrial fibrillation with RVR in the setting of noncompliance with medication: Patient reported that he was not taking medicine since August because of pills burden.  He has a history of noncompliance with the medication. -Continue Cardizem drip and started oral diltiazem every 6 hourly.  Reduce the dose of morning metoprolol because of hypotension.  Continue to monitor heart rate and blood pressure in a stepdown unit today.  Patient with history of resistant tachycardia. - Continue to monitor.  On Eliquis for anticoagulation.  Cardiology consult appreciated.  #Chronic kidney disease stage III: Stable.  Serum creatinine level around baseline.  Monitor BMP.  #Anxiety depression: Continue home medication.  Mood is stable.  PT OT and case manager evaluation. Continue supportive care.  DVT prophylaxis: Eliquis Code Status:partial DNR Family Communication:no family at bedside Disposition Plan:admitted    Consultants:   cardiology  Procedures:none Antimicrobials:none  Subjective: Seen and examined at bedside.  Required diltiazem drip and now switching to oral.  Continue to feel not good associated with generalized weakness.  Denied headache, dizziness, chest pain shortness of  breath.  Objective: Vitals:   05/25/17 0400 05/25/17 0800 05/25/17 0929 05/25/17 1000  BP: 103/74 93/70  102/77  Pulse: 68 81  (!) 59  Resp: 15 15  12   Temp: 98 F (36.7 C) 97.9 F (36.6 C)    TempSrc: Oral Oral    SpO2: 100% 97% 98% 100%  Weight: 61.5 kg (135 lb 9.3 oz)     Height:        Intake/Output Summary (Last 24 hours) at 05/25/2017 1235 Last data filed at 05/25/2017 1200 Gross per 24 hour  Intake 340.92 ml  Output 1200 ml  Net -859.08 ml   Filed Weights   05/22/17 1833 05/25/17 0400  Weight: 59 kg (130 lb) 61.5 kg (135 lb 9.3 oz)    Examination:  General exam: Not in distress Respiratory system: Clear bilateral, respiratory effort normal.  No wheezing.  Cardiovascular system: Irregularly irregular, S1-S2 normal.  No pedal edema. Gastrointestinal system: Abdomen is soft, nontender.  Bowel sounds positive  Central nervous system: Alert and oriented. No focal neurological deficits. Extremities: Symmetric 5 x 5 power. Skin: No rashes, lesions or ulcers Psychiatry: Judgement and insight appear normal. Mood & affect appropriate.     Data Reviewed: I have personally reviewed following labs and imaging studies  CBC: Recent Labs  Lab 05/22/17 1822 05/23/17 0816 05/24/17 0013  WBC 11.5* 7.9 10.0  NEUTROABS 8.1* 7.1  --   HGB 16.9 15.6 13.3  HCT 49.3 45.5 40.4  MCV 91.3 91.0 91.4  PLT 154 140* 106*   Basic Metabolic Panel: Recent Labs  Lab 05/22/17 2126 05/23/17 0816 05/24/17 0013 05/25/17 0346  NA 142 139 138 133*  K 4.6 4.5 5.0 4.9  CL 111 109 106 104  CO2 23 21* 22 23  GLUCOSE 88 184* 134* 144*  BUN 35* 36* 38* 40*  CREATININE 1.38* 1.36* 1.62* 1.46*  CALCIUM 9.2 9.4 9.0 8.7*   GFR: Estimated Creatinine Clearance: 39.2 mL/min (A) (by C-G formula based on SCr of 1.46 mg/dL (H)). Liver Function Tests: Recent Labs  Lab 05/22/17 2126  AST 21  ALT 11*  ALKPHOS 72  BILITOT 1.5*  PROT 7.2  ALBUMIN 4.0   No results for input(s): LIPASE,  AMYLASE in the last 168 hours. No results for input(s): AMMONIA in the last 168 hours. Coagulation Profile: No results for input(s): INR, PROTIME in the last 168 hours. Cardiac Enzymes: Recent Labs  Lab 05/23/17 1254 05/23/17 1853 05/24/17 0013  TROPONINI <0.03 <0.03 <0.03   BNP (last 3 results) No results for input(s): PROBNP in the last 8760 hours. HbA1C: No results for input(s): HGBA1C in the last 72 hours. CBG: Recent Labs  Lab 05/23/17 0754 05/24/17 0758 05/25/17 0744  GLUCAP 189* 125* 109*   Lipid Profile: No results for input(s): CHOL, HDL, LDLCALC, TRIG, CHOLHDL, LDLDIRECT in the last 72 hours. Thyroid Function Tests: Recent Labs    05/23/17 1255  TSH 0.767   Anemia Panel: No results for input(s): VITAMINB12, FOLATE, FERRITIN, TIBC, IRON, RETICCTPCT in the last 72 hours. Sepsis Labs: No results for input(s): PROCALCITON, LATICACIDVEN in the last 168 hours.  Recent Results (from the past 240 hour(s))  Culture, blood (Routine X 2) w Reflex to ID Panel     Status: None (Preliminary result)   Collection Time: 05/23/17 12:55 PM  Result Value Ref Range Status   Specimen Description BLOOD LEFT FOREARM  Final   Special Requests   Final    BOTTLES DRAWN AEROBIC AND ANAEROBIC Blood Culture adequate volume   Culture   Final    NO GROWTH 2 DAYS Performed at [facility], [address] [place], [place] [address]    Report Status PENDING  Incomplete  Respiratory Panel by PCR     Status: None   Collection Time: 05/23/17  3:17 PM  Result Value Ref Range Status   Adenovirus NOT DETECTED NOT DETECTED Final   Coronavirus 229E NOT DETECTED NOT DETECTED Final   Coronavirus HKU1 NOT DETECTED NOT DETECTED Final   Coronavirus NL63 NOT DETECTED NOT DETECTED Final   Coronavirus OC43 NOT DETECTED NOT DETECTED Final   Metapneumovirus NOT DETECTED NOT DETECTED Final   Rhinovirus / Enterovirus NOT DETECTED NOT DETECTED Final   Influenza A NOT DETECTED NOT DETECTED Final    Influenza B NOT DETECTED NOT DETECTED Final   Parainfluenza Virus 1 NOT DETECTED NOT DETECTED Final   Parainfluenza Virus 2 NOT DETECTED NOT DETECTED Final   Parainfluenza Virus 3 NOT DETECTED NOT DETECTED Final   Parainfluenza Virus 4 NOT DETECTED NOT DETECTED Final   Respiratory Syncytial Virus NOT DETECTED NOT DETECTED Final   Bordetella pertussis NOT DETECTED NOT DETECTED Final   Chlamydophila pneumoniae NOT DETECTED NOT DETECTED Final   Mycoplasma pneumoniae NOT DETECTED NOT DETECTED Final  MRSA PCR Screening     Status: None   Collection Time: 05/23/17  3:20 PM  Result Value Ref Range Status   MRSA by PCR NEGATIVE NEGATIVE Final    Comment:        The GeneXpert MRSA Assay (FDA approved for NASAL specimens only), is one component of a comprehensive MRSA colonization surveillance program. It is not intended to diagnose MRSA infection nor to guide or monitor treatment for MRSA infections.   Culture, blood (Routine X 2) w Reflex to ID  Panel     Status: None (Preliminary result)   Collection Time: 05/23/17  9:02 PM  Result Value Ref Range Status   Specimen Description BLOOD RIGHT ANTECUBITAL  Final   Special Requests   Final    BOTTLES DRAWN AEROBIC AND ANAEROBIC Blood Culture results may not be optimal due to an inadequate volume of blood received in culture bottles   Culture   Final    NO GROWTH 2 DAYS Performed at [facility] [address], [place], [place] [address]    Report Status PENDING  Incomplete         Radiology Studies: No results found.      Scheduled Meds: . apixaban  2.5 mg Oral BID  . aspirin  81 mg Oral Daily  . buPROPion  100 mg Oral BID  . diltiazem  30 mg Oral Q6H  . feeding supplement  1 Container Oral TID BM  . ipratropium-albuterol  3 mL Nebulization TID  . mouth rinse  15 mL Mouth Rinse BID  . metoprolol succinate  50 mg Oral QHS  . metoprolol succinate  50 mg Oral Daily  . mometasone-formoterol  2 puff Inhalation BID  .  nicotine  21 mg Transdermal Daily  . predniSONE  50 mg Oral Q breakfast  . rosuvastatin  20 mg Oral q1800  . senna-docusate  2 tablet Oral BID  . sodium chloride flush  3 mL Intravenous Q12H   Continuous Infusions:   LOS: 3 days    Dron Tanna Furry, MD [facility] Pager [phone]  If 7PM-7AM, please contact night-coverage [email or web address] Password [facility] 05/25/2017, 12:35 PM

## 2017-05-25 NOTE — Progress Notes (Signed)
PT Cancellation Note  Patient Details Name: [name] MRN: [id number] DOB: 07/05/1945   Cancelled Treatment:     PT deferred this date, pt continues on Cardizem drip.  Will follow.   [name] 05/25/2017, 7:28 AM

## 2017-05-25 NOTE — Progress Notes (Signed)
Initial Nutrition Assessment  DOCUMENTATION CODES:   Severe malnutrition in context of chronic illness, Underweight  INTERVENTION:   -Continue Boost Breeze po TID, each supplement provides 250 kcal and 9 grams of protein -Provide Magic cup TID with meals, each supplement provides 290 kcal and 9 grams of protein -Provide Multivitamin with minerals daily  RD will continue to monitor  NUTRITION DIAGNOSIS:   Severe Malnutrition related to chronic illness(COPD) as evidenced by severe fat depletion, severe muscle depletion.  GOAL:   Patient will meet greater than or equal to 90% of their needs  MONITOR:   PO intake, Supplement acceptance, Weight trends, Labs, I & O's  REASON FOR ASSESSMENT:   Consult COPD Protocol  ASSESSMENT:   73 year old male with history of COPD, atrial fibrillation on Eliquis, chronic kidney disease stage III, depression and chronic pain presented with shortness of breath, cough, fever and chills.  He was admitted with COPD exacerbation and atrial fibrillation with rapid ventricular rate and started on Solu-Medrol and Cardizem drip.  Pt currently consuming meals but no PO intakes are recorded in chart. Pt is drinking Boost Breeze supplements, will continue order. Pt reports PTA he did not eat for 3 days. Today, pt was feeling nauseous and had some indigestion this morning. Pt with history of noncompliance with medications. Palliative care expected to see patient.  Per MD note, pt now on regular diet as pt threatened to leave AMA if he was placed on a heart healthy diet. Will order additional supplement of Magic cups with meals and MVI daily.   Per chart review, weight has been stable. With some fluctuations between March and June of 2018.   Medications: Senokot-S tablet BID,  GI cocktail PRN, IV Zofran PRN  NUTRITION - FOCUSED PHYSICAL EXAM:  -consistent with findings from previous nutrition assessments.    Most Recent Value  Orbital Region  Moderate  depletion  Upper Arm Region  Severe depletion  Thoracic and Lumbar Region  Unable to assess  Buccal Region  Moderate depletion  Temple Region  Severe depletion  Clavicle Bone Region  Severe depletion  Clavicle and Acromion Bone Region  Severe depletion  Scapular Bone Region  Unable to assess  Dorsal Hand  Severe depletion  Patellar Region  Unable to assess  Anterior Thigh Region  Unable to assess  Posterior Calf Region  Unable to assess  Edema (RD Assessment)  None       Diet Order:  Diet regular Room service appropriate? Yes; Fluid consistency: Thin; Fluid restriction: 2000 mL Fluid  EDUCATION NEEDS:   No education needs have been identified at this time  Skin:  Skin Assessment: Reviewed RN Assessment  Last BM:  1/2  Height:   Ht Readings from Last 1 Encounters:  05/22/17 6\' 3"  (1.905 m)    Weight:   Wt Readings from Last 1 Encounters:  05/25/17 135 lb 9.3 oz (61.5 kg)    Ideal Body Weight:  89.1 kg  BMI:  Body mass index is 16.95 kg/m.  Estimated Nutritional Needs:   Kcal:  1850-2150  Protein:  95-105g  Fluid:  1.9L/day  Travis Bibles, MS, RD, LDN [place] Dietitian Pager: [phone] After Hours Pager: [phone]

## 2017-05-25 NOTE — Progress Notes (Signed)
Progress Note  Patient Name: [name] Date of Encounter: 05/25/2017  Primary Cardiologist Allred  Primary Electrophysiologist:  JA   Patient Profile     72 y.o. male admitted with shortness of breath and found to have atrial fibrillation with a rapid rate .  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 apixaban  2.5 mg Oral BID  . aspirin  81 mg Oral Daily  . buPROPion  100 mg Oral BID  . feeding supplement  1 Container Oral TID BM  . ipratropium-albuterol  3 mL Nebulization TID  . mouth rinse  15 mL Mouth Rinse BID  . metoprolol succinate  100 mg Oral Daily  . metoprolol succinate  50 mg Oral QHS  . mometasone-formoterol  2 puff Inhalation BID  . nicotine  21 mg Transdermal Daily  . predniSONE  50 mg Oral Q breakfast  . rosuvastatin  20 mg Oral q1800  . senna-docusate  2 tablet Oral BID  . sodium chloride flush  3 mL Intravenous Q12H   Continuous Infusions: . diltiazem (CARDIZEM) infusion 5 mg/hr (05/25/17 0402)   PRN Meds: acetaminophen **OR** acetaminophen, albuterol, alum & mag hydroxide-simeth, gi cocktail, ondansetron **OR** ondansetron (ZOFRAN) IV, oxyCODONE-acetaminophen **AND** oxyCODONE, polyethylene glycol   Vital Signs    Vitals:   05/24/17 2312 05/25/17 0000 05/25/17 0200 05/25/17 0400  BP: 106/84  (!) 94/55 103/74  Pulse:   (!) 44 68  Resp:   20 15  Temp:  97.8 F (36.6 C)  98 F (36.7 C)  TempSrc:  Oral  Oral  SpO2:   98% 100%  Weight:    135 lb 9.3  oz (61.5 kg)  Height:        Intake/Output Summary (Last 24 hours) at 05/25/2017 0755 Last data filed at 05/25/2017 0402 Gross per 24 hour  Intake 340.92 ml  Output 1100 ml  Net -759.08 ml   Filed Weights   05/22/17 1833 05/25/17 0400  Weight: 130 lb (59 kg) 135 lb 9.3 oz (61.5 kg)    Telemetry    Personally reviewed  afib with rates in 80-100  ECG     Physical Exam   Frail and cachectic Ill-appearing  AA male no acute distress HENT normal Neck supple   Clear Irregularly irregular rate and rhythm with controlled ventricular response, no murmurs or gallops Abd-soft   No Clubbing cyanosis edema Skin-warm and dry A & Oriented  Grossly normal sensory and motor function   Labs    Chemistry Recent Labs  Lab 05/22/17 2126 05/23/17 0816 05/24/17 0013 05/25/17 0346  NA 142 139 138 133*  K 4.6 4.5 5.0 4.9  CL 111 109 106 104  CO2 23 21* 22 23  GLUCOSE 88 184* 134* 144*  BUN 35* 36* 38* 40*  CREATININE 1.38* 1.36* 1.62* 1.46*  CALCIUM 9.2 9.4 9.0 8.7*  PROT 7.2  --   --   --   ALBUMIN 4.0  --   --   --   AST 21  --   --   --  ALT 11*  --   --   --   ALKPHOS 72  --   --   --   BILITOT 1.5*  --   --   --   GFRNONAA 49* 50* 40* 46*  GFRAA 57* 58* 47* 53*  ANIONGAP 8 9 10 6      Hematology Recent Labs  Lab 05/22/17 1822 05/23/17 0816 05/24/17 0013  WBC 11.5* 7.9 10.0  RBC 5.40 5.00 4.42  HGB 16.9 15.6 13.3  HCT 49.3 45.5 40.4  MCV 91.3 91.0 91.4  MCH 31.3 31.2 30.1  MCHC 34.3 34.3 32.9  RDW 15.0 15.0 15.0  PLT 154 140* 149*    Cardiac Enzymes Recent Labs  Lab 05/23/17 1254 05/23/17 1853 05/24/17 0013  TROPONINI <0.03 <0.03 <0.03   No results for input(s): TROPIPOC in the last 168 hours.   BNP Recent Labs  Lab 05/22/17 1822  BNP 122.7*     DDimer No results for input(s): DDIMER in the last 168 hours.   Radiology    No results found.  Cardiac Studies     Device Interrogation    Echo EF 07/1728%   Assessment & Plan  Atrial  fibrillation   COPD-acute exacerbation  Poor compliance  Renal insufficiency acute on chronic grade 3  LV dysfunction thought rate related  DVT/arterial thrombosis apparently underlying recommendation for adjunctive aspirin  Venous occlusive disease   HR much improved  Will diltiazem IV>>PO and decrease metoprolol 2/2 hypotension  Needs mobilization  Renal function better      Signed, Virl Axe, MD  05/25/2017, 7:55 AM

## 2017-05-25 NOTE — Progress Notes (Signed)
Pt c/o heartburn.  GI cocktail given with relief.

## 2017-05-26 DIAGNOSIS — Z7189 Other specified counseling: Secondary | ICD-10-CM

## 2017-05-26 DIAGNOSIS — Z515 Encounter for palliative care: Secondary | ICD-10-CM

## 2017-05-26 LAB — BASIC METABOLIC PANEL
ANION GAP: 6 (ref 5–15)
BUN: 41 mg/dL — ABNORMAL HIGH (ref 6–20)
CALCIUM: 9.3 mg/dL (ref 8.9–10.3)
CO2: 25 mmol/L (ref 22–32)
Chloride: 105 mmol/L (ref 101–111)
Creatinine, Ser: 1.55 mg/dL — ABNORMAL HIGH (ref 0.61–1.24)
GFR calc Af Amer: 50 mL/min — ABNORMAL LOW (ref >60)
GFR calc non Af Amer: 43 mL/min — ABNORMAL LOW (ref >60)
GLUCOSE: 101 mg/dL — AB (ref 65–99)
Potassium: 4.9 mmol/L (ref 3.5–5.1)
Sodium: 136 mmol/L (ref 135–145)

## 2017-05-26 LAB — GLUCOSE, CAPILLARY: Glucose-Capillary: 135 mg/dL — ABNORMAL HIGH (ref 65–99)

## 2017-05-26 MED ORDER — DILTIAZEM HCL ER COATED BEADS 120 MG PO CP24
120.0000 mg | ORAL_CAPSULE | Freq: Every day | ORAL | Status: DC
  Administered 2017-05-26 – 2017-05-27 (×2): 120 mg via ORAL
  Filled 2017-05-26 (×2): qty 1

## 2017-05-26 MED ORDER — ASPIRIN EC 81 MG PO TBEC
81.0000 mg | DELAYED_RELEASE_TABLET | Freq: Every day | ORAL | Status: DC
  Administered 2017-05-26 – 2017-05-27 (×2): 81 mg via ORAL
  Filled 2017-05-26 (×2): qty 1

## 2017-05-26 NOTE — Progress Notes (Signed)
OT Cancellation Note  Patient Details Name: [name] MRN: [id number] DOB: Oct 16, 1945   Cancelled Treatment:    Reason Eval/Treat Not Completed: Other (comment)  Spoke with PT who stated pt not agreeable to therapy this day.  Will check on next day.  Kari Baars, [place]  Payton Mccallum D 05/26/2017, 1:59 PM

## 2017-05-26 NOTE — Consult Note (Signed)
Consultation Note Date: 05/26/2017   Patient Name: [name]  DOB: 04-18-1946  MRN: [id number]  Age / Sex: 72 y.o., male  PCP: Zada Finders, MD Referring Physician: Rosita Fire, MD  Reason for Consultation: Establishing goals of care  HPI/Patient Profile: 72 y.o. male  with past medical history of COPD, a fib on Eliquis, CKD, depression, ? Bipolar d/o admitted on 05/22/2017 with COPD exacerbation and a fib with RVR.  He has a history of poor medication compliance.  Palliative consulted for [place].   Clinical Assessment and Goals of Care: I met today with [name].  I introduced palliative care as specialized medical care for people living with serious illness. It focuses on providing relief from the symptoms and stress of a serious illness. The goal is to improve quality of life for both the patient and the family.  He reports that the most important thing to him is his family.  He has 10 sons and a daughter.  We discussed clinical course as well as wishes moving forward in regard to advanced directives.  Concepts specific to code status and rehospitalization discussed.  Values and goals of care important to patient and family were attempted to be elicited.  SUMMARY OF RECOMMENDATIONS   - No intubation, No CPR, but full scope treatment otherwise. - He would like to name his daughter as his surrogate Media planner.  I placed a consult to spiritual care to assist. - Patient understands that his continued compliance is necessary moving forward if he is going to maximize his future health. - I left my card and he will call if he would like to meet further with our team.  Code Status/Advance Care Planning:  No CPR, No intubation  Psycho-social/Spiritual:   Desire for further Chaplaincy support:yes  Additional Recommendations: Completion of advanced directive  Prognosis:   Unable to  determine  Discharge Planning: Home with Home Health      Primary Diagnoses: Present on Admission: . CKD (chronic kidney disease) stage 3, GFR 30-59 ml/min ([place]) . Major depressive disorder, recurrent episode ([place]) . Atrial fibrillation with RVR ([place]) . COPD with acute exacerbation ([place]) . Chronic respiratory failure with hypoxia ([place])   I have reviewed the medical record, interviewed the patient and family, and examined the patient. The following aspects are pertinent.  Past Medical History:  Diagnosis Date  . Alcohol abuse   . Anxiety   . Asbestos exposure   . Avascular necrosis of bones of both hips (HCC)    Status post bilateral hip replacements  . Bipolar disorder ([place])   . Chronic systolic heart failure ([place])    a. NICM - EF 35% with normal cors 2003. b. Last echo 11/2012 - EF 25-30%.  . CKD (chronic kidney disease), stage III ([place])    Renal U/S 12/04/2009 showed no pathological findings. Labs 12/04/2009 include normal ESR, C3, C4; neg ANA; SPEP showed nonspecific increase in the alpha-2 region with no M-spike; UPEP showed no monoclonal free light chains; urine IFE showed polyclonal  increase in feree Kappa and/or free Lambda light chains. Baseline Cr reported 1.7-2.5.  [name] COPD (chronic obstructive pulmonary disease) ([place])   . Depression   . DVT (deep venous thrombosis) ([place])    He has hypercoagulability with multiple prior DVTs  . E coli bacteremia   . Erectile dysfunction   . Failure to thrive (0-17) 08/2016   DEHYDRATION  . Gastritis   . GERD (gastroesophageal reflux disease)   . HCAP (healthcare-associated pneumonia) 08/24/2015  . Hydrocele, unspecified   . Hyperlipemia   . Hypertension   . 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  Thyroid scan 08/28/2009 was normal with no focal areas of abnormal increased or decreased activity seen; the uptake of I 131 sodium iodide at 24 hours was 5.7%.  TSH and free T4 normalized by  08/16/2009.  . Intestinal obstruction ([place])   . Lung nodule    Chest CT scan on 12/14/2008 showed a nodular opacity at the left lung base felt to most likely represent scarring.  Follow-up chest CT scan on 06/02/2010 showed parenchymal scarring in the left apex, left  lower lobe, and lingula; some of this scarring at the left base had a nodular appearance, unchanged. Chest 09/2012 - stable.  [name] NICM (nonischemic cardiomyopathy) ([place])   . Noncompliance   . On home oxygen therapy    "3L periodically" (08/24/2015)  . Osteoarthritis   . Permanent atrial fibrillation ([place])    a. failed ablation in 2010 due to inability to access/venous occlusions. b. failed multaq, amiodarone, tikosyn. c. has not been felt to be a good candidate for AVN ablation due to recurrent infections/bacteremia history.  . Pleural plaque    H/o asbestos exposure. Chest CT on 06/02/2010 showed stable extensive calcified pleural plaques involving the left hemithorax, consistent with asbestos related pleural disease.  . Portal vein thrombosis   . Protein calorie malnutrition ([place]) 04/2016  . Spondylosis   . STEMI (ST elevation myocardial infarction) ([place]) 03/88/8280   a. felt embolic in nature due to noncompliance with anticoagulation, LHC not pursued.  . Tachycardia-bradycardia syndrome [facility])    a. 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 b. St. Jude gen change 2010.  [name] Thrombocytopenia ([place])   . 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  . Weight loss    Normal colonoscopy by Dr. Olevia Perches on 11/06/2010.   Social History   Socioeconomic History  . Marital status: Widowed    Spouse name: None  . Number of children: 10  . Years of education: None  . Highest education level: None  Social Needs  . Financial resource strain: None  . Food insecurity - worry: None  . Food insecurity - inability: None  . Transportation needs -  medical: None  . Transportation needs - non-medical: None  Occupational History  . Occupation: RETIRED    Employer: RETIRED  Tobacco Use  . Smoking status: Current Some Day Smoker    Packs/day: 0.25    Years: 54.00    Pack years: 13.50    Types: Cigarettes    Start date: 37  . Smokeless tobacco: Current User  . Tobacco comment: cutting back  Substance and Sexual Activity  . Alcohol use: No    Alcohol/week: 0.0 oz    Comment: History of ETOH Abuse."  . Drug use: Yes    Frequency: 1.0 times per week    Types: Marijuana  Comment: Every other day.   [name] Sexual activity: Not Currently  Other Topics Concern  . None  Social History Narrative   Retired Architect G62 yrs here in [place] 1 pack last 1.5 days tobacco, smokes marijuana    Denies EtOH x 4 years (on 10/12/12)   9 kids    From [place]   [place] Veteran    12 grade education          Family History  Problem Relation Age of Onset  . Hypertension Mother   . Asthma Mother   . Coronary artery disease Mother   . Cancer Mother   . Stroke Mother   . Cancer Brother        Unsure of type.  [name] Heart disease Brother   . Heart attack Brother   . Heart attack Sister   . Colon cancer Neg Hx   . Lung cancer Neg Hx   . Prostate cancer Neg Hx    Scheduled Meds: . apixaban  2.5 mg Oral BID  . aspirin EC  81 mg Oral Daily  . buPROPion  100 mg Oral BID  . diltiazem  120 mg Oral Daily  . feeding supplement  1 Container Oral TID BM  . ipratropium-albuterol  3 mL Nebulization TID  . mouth rinse  15 mL Mouth Rinse BID  . metoprolol succinate  50 mg Oral QHS  . metoprolol succinate  50 mg Oral Daily  . mometasone-formoterol  2 puff Inhalation BID  . multivitamin with minerals  1 tablet Oral Daily  . nicotine  21 mg Transdermal Daily  . rosuvastatin  20 mg Oral q1800  . senna-docusate  2 tablet Oral BID  . sodium chloride flush  3 mL Intravenous Q12H   Continuous Infusions: PRN Meds:.acetaminophen **OR**  acetaminophen, albuterol, alum & mag hydroxide-simeth, diphenhydrAMINE, gi cocktail, ondansetron **OR** ondansetron (ZOFRAN) IV, oxyCODONE-acetaminophen **AND** oxyCODONE, polyethylene glycol Medications Prior to Admission:  Prior to Admission medications   Medication Sig Start Date End Date Taking? Authorizing Provider  albuterol (VENTOLIN HFA) 108 (90 BASE) MCG/ACT inhaler Inhale 2 puffs into the lungs every 6 (six) hours as needed for wheezing or shortness of breath. 01/06/15  Yes Aldine Contes, MD  oxyCODONE-acetaminophen (PERCOCET) 10-325 MG tablet Take 1 tablet by mouth every 4 (four) hours as needed for pain. Do not take more than 5 tablets in 24 hours. 03/05/17 03/04/18 Yes Zada Finders, MD  tiotropium (SPIRIVA) 18 MCG inhalation capsule Place 1 capsule (18 mcg total) into inhaler and inhale daily. 01/06/15  Yes Aldine Contes, MD  apixaban (ELIQUIS) 2.5 MG TABS tablet Take 1 tablet (2.5 mg total) by mouth 2 (two) times daily. Patient not taking: Reported on 05/22/2017 02/12/17   Zada Finders, MD  aspirin 81 MG chewable tablet Chew 1 tablet (81 mg total) by mouth daily. Patient not taking: Reported on 05/22/2017 02/12/17   Zada Finders, MD  buPROPion [facility] SR) 100 MG 12 hr tablet Take 1 tablet (100 mg total) by mouth 2 (two) times daily. Patient not taking: Reported on 05/22/2017 02/12/17   Zada Finders, MD  feeding supplement (BOOST / RESOURCE BREEZE) LIQD Take 1 Container by mouth 3 (three) times daily between meals. Patient not taking: Reported on 05/22/2017 09/09/16   Burgess Estelle, MD  metoprolol succinate (TOPROL-XL) 50 MG 24 hr tablet Take 100 mg in the morning and 50 mg in the evening. Take with or immediately following a meal. Patient not taking: Reported on 05/22/2017 02/12/17  Zada Finders, MD  midodrine (PROAMATINE) 5 MG tablet Take 1 tablet (5 mg total) by mouth 3 (three) times daily with meals. Patient not taking: Reported on 05/22/2017 02/12/17   Zada Finders, MD    polyethylene glycol [facility] Floria Raveling) packet Take 17 g by mouth daily as needed. Patient not taking: Reported on 05/22/2017 02/12/17   Zada Finders, MD  rosuvastatin (CRESTOR) 20 MG tablet TAKE 1 TABLET EVERY DAY AT 6 PM Patient not taking: Reported on 05/22/2017 02/12/17   Zada Finders, MD  senna-docusate (SENOKOT-S) 8.6-50 MG tablet Take 2 tablets by mouth 2 (two) times daily. Patient not taking: Reported on 05/22/2017 02/12/17   Zada Finders, MD   Allergies  Allergen Reactions  . Amiodarone Other (See Comments)    Thyroiditis in the setting of amiodarone use  . Penicillins Rash and Other (See Comments)    Has patient had a PCN reaction causing immediate rash, facial/tongue/throat swelling, SOB or lightheadedness with hypotension: Yes Has patient had a PCN reaction causing severe rash involving mucus membranes or skin necrosis: No Has patient had a PCN reaction that required hospitalization No Has patient had a PCN reaction occurring within the last 10 years: No If all of the above answers are "NO", then may proceed with Cephalosporin use.   Review of Systems Denies complaints currently  Physical Exam General: Alert, awake, in no acute distress.  HEENT: No bruits, no goiter, no JVD Heart: irregular rate and rhythm. No murmur appreciated. Lungs: Good air movement, mild wheeze Abdomen: Soft, nontender, nondistended, positive bowel sounds.  Ext: No significant edema Skin: Warm and dry Neuro: Grossly intact, nonfocal.  Vital Signs: BP 111/80 (BP Location: Right Arm)   Pulse (!) 112   Temp 97.9 F (36.6 C) (Oral)   Resp 20   Ht '6\' 3"'$  (1.905 m)   Wt 61.8 kg (136 lb 3.9 oz)   SpO2 92%   BMI 17.03 kg/m  Pain Assessment: 0-10 POSS *See Group Information*: 1-Acceptable,Awake and alert Pain Score: 7    SpO2: SpO2: 92 % O2 Device:SpO2: 92 % O2 Flow Rate: .O2 Flow Rate (L/min): 2 L/min  IO: Intake/output summary:   Intake/Output Summary (Last 24 hours) at 05/26/2017 2243 Last  data filed at 05/26/2017 1800 Gross per 24 hour  Intake 840 ml  Output 1000 ml  Net -160 ml    LBM: Last BM Date: 05/22/17 Baseline Weight: Weight: 59 kg (130 lb) Most recent weight: Weight: 61.8 kg (136 lb 3.9 oz)     Palliative Assessment/Data:   Flowsheet Rows     Most Recent Value  Intake Tab  Referral Department  Hospitalist  Unit at Time of Referral  Intermediate Care Unit  Palliative Care Primary Diagnosis  Cardiac  Date Notified  05/24/17  Palliative Care Type  New Palliative care  Reason for referral  Clarify Goals of Care  Date of Admission  05/22/17  Date first seen by Palliative Care  05/26/17  # of days Palliative referral response time  2 Day(s)  # of days IP prior to Palliative referral  2  Clinical Assessment  Palliative Performance Scale Score  90%  Pain Max last 24 hours  4  Pain Min Last 24 hours  0  Psychosocial & Spiritual Assessment  Palliative Care Outcomes  Patient/Family meeting held?  Yes  Who was at the meeting?  patient      Time In 1800 Time Out 1900 Time Total: 60 Greater than 50%  of this time was spent  counseling and coordinating care related to the above assessment and plan.  Signed by: Micheline Rough, MD   Please contact Palliative Medicine Team phone at [phone] for questions and concerns.  For individual provider: See Shea Evans

## 2017-05-26 NOTE — Progress Notes (Signed)
PT Cancellation Note  Patient Details Name: [name] MRN: [id number] DOB: October 21, 1945   Cancelled Treatment:    Reason Eval/Treat Not Completed: Patient declined, no reason specified, patient stated later.  Will check back when schedule permits.   Claretha Cooper 05/26/2017, 11:53 AM  Tresa Endo PT [phone]

## 2017-05-26 NOTE — Progress Notes (Signed)
PROGRESS NOTE    BRAIDEN [name]  TKZ:[id number] DOB: 09-05-1945 DOA: 05/22/2017 PCP: Zada Finders, MD   Brief Narrative: 72 year old male with history of COPD, atrial fibrillation on Eliquis, chronic kidney disease stage III, depression and chronic pain presented with shortness of breath, cough, fever and chills.  He was admitted with COPD exacerbation and atrial fibrillation with rapid ventricular rate and started on Solu-Medrol and Cardizem drip.  Patient is noncompliant with medication.  Assessment & Plan:   #COPD exacerbation with chronic respiratory failure with hypoxia: Treated with bronchodilators and prednisone.  Patient still reported not feeling well.  He is breathing okay and oxygen saturation acceptable.  Continue supportive care.  #Atrial fibrillation with RVR in the setting of noncompliance with medication: Patient reported that he was not taking medicine since August because of pills burden.  He has a history of noncompliance with the medication. -Currently on oral metoprolol and oral diltiazem.  Heart rate is fluctuating.  Mostly heart rate is controlled.  Plan to transfer his care to telemetry floor from stepdown.  Case manager evaluation for safe discharge plan.  Patient with history of resistant tachycardia. - On Eliquis for anticoagulation.  Cardiology consult appreciated. -PT and OT evaluation.  #Chronic kidney disease stage III: Stable.  Serum creatinine level around baseline.  Monitor BMP.  #Anxiety depression: Continue home medication.  Mood is stable.  PT OT and case manager evaluation. Continue supportive care.  DVT prophylaxis: Eliquis Code Status:partial DNR Family Communication:no family at bedside Disposition Plan: Transfer to telemetry floor.  Likely discharge home tomorrow.    Consultants:   cardiology  Procedures:none Antimicrobials:none  Subjective: Seen and examined at bedside.  Reported not feeling well.  Denied chest pain, shortness of  breath, nausea vomiting..  Objective: Vitals:   05/26/17 0800 05/26/17 1000 05/26/17 1100 05/26/17 1200  BP: 105/82 104/69 103/70   Pulse: 67 (!) 115 95   Resp: 20 19 12    Temp: (!) 97.5 F (36.4 C)   97.9 F (36.6 C)  TempSrc: Axillary   Axillary  SpO2: 100% 100% 100%   Weight:      Height:        Intake/Output Summary (Last 24 hours) at 05/26/2017 1345 Last data filed at 05/26/2017 1100 Gross per 24 hour  Intake 580 ml  Output 600 ml  Net -20 ml   Filed Weights   05/22/17 1833 05/25/17 0400 05/26/17 0500  Weight: 59 kg (130 lb) 61.5 kg (135 lb 9.3 oz) 61.8 kg (136 lb 3.9 oz)    Examination:  General exam: Not in distress.  Respiratory system: Mild expiratory wheeze with basilar rhonchi, respiratory effort normal. Cardiovascular system: Irregularly irregular, S1 is normal.  No pedal edema.. Gastrointestinal system: Abdomen is soft, nontender.  Bowel sounds positive  Central nervous system: Alert and oriented. No focal neurological deficits. Extremities: Symmetric 5 x 5 power. Skin: No rashes, lesions or ulcers Psychiatry: Judgement and insight appear normal. Mood & affect appropriate.     Data Reviewed: I have personally reviewed following labs and imaging studies  CBC: Recent Labs  Lab 05/22/17 1822 05/23/17 0816 05/24/17 0013  WBC 11.5* 7.9 10.0  NEUTROABS 8.1* 7.1  --   HGB 16.9 15.6 13.3  HCT 49.3 45.5 40.4  MCV 91.3 91.0 91.4  PLT 154 140* 573*   Basic Metabolic Panel: Recent Labs  Lab 05/22/17 2126 05/23/17 0816 05/24/17 0013 05/25/17 0346 05/26/17 0331  NA 142 139 138 133* 136  K 4.6 4.5 5.0 4.9 4.9  CL 111 109 106 104 105  CO2 23 21* 22 23 25   GLUCOSE 88 184* 134* 144* 101*  BUN 35* 36* 38* 40* 41*  CREATININE 1.38* 1.36* 1.62* 1.46* 1.55*  CALCIUM 9.2 9.4 9.0 8.7* 9.3   GFR: Estimated Creatinine Clearance: 37.1 mL/min (A) (by C-G formula based on SCr of 1.55 mg/dL (H)). Liver Function Tests: Recent Labs  Lab 05/22/17 2126  AST 21    ALT 11*  ALKPHOS 72  BILITOT 1.5*  PROT 7.2  ALBUMIN 4.0   No results for input(s): LIPASE, AMYLASE in the last 168 hours. No results for input(s): AMMONIA in the last 168 hours. Coagulation Profile: No results for input(s): INR, PROTIME in the last 168 hours. Cardiac Enzymes: Recent Labs  Lab 05/23/17 1254 05/23/17 1853 05/24/17 0013  TROPONINI <0.03 <0.03 <0.03   BNP (last 3 results) No results for input(s): PROBNP in the last 8760 hours. HbA1C: No results for input(s): HGBA1C in the last 72 hours. CBG: Recent Labs  Lab 05/23/17 0754 05/24/17 0758 05/25/17 0744 05/26/17 0743  GLUCAP 189* 125* 109* 135*   Lipid Profile: No results for input(s): CHOL, HDL, LDLCALC, TRIG, CHOLHDL, LDLDIRECT in the last 72 hours. Thyroid Function Tests: No results for input(s): TSH, T4TOTAL, FREET4, T3FREE, THYROIDAB in the last 72 hours. Anemia Panel: No results for input(s): VITAMINB12, FOLATE, FERRITIN, TIBC, IRON, RETICCTPCT in the last 72 hours. Sepsis Labs: No results for input(s): PROCALCITON, LATICACIDVEN in the last 168 hours.  Recent Results (from the past 240 hour(s))  Culture, blood (Routine X 2) w Reflex to ID Panel     Status: None (Preliminary result)   Collection Time: 05/23/17 12:55 PM  Result Value Ref Range Status   Specimen Description BLOOD LEFT FOREARM  Final   Special Requests   Final    BOTTLES DRAWN AEROBIC AND ANAEROBIC Blood Culture adequate volume   Culture   Final    NO GROWTH 3 DAYS Performed at [facility], [address] [place], [place] [address]    Report Status PENDING  Incomplete  Respiratory Panel by PCR     Status: None   Collection Time: 05/23/17  3:17 PM  Result Value Ref Range Status   Adenovirus NOT DETECTED NOT DETECTED Final   Coronavirus 229E NOT DETECTED NOT DETECTED Final   Coronavirus HKU1 NOT DETECTED NOT DETECTED Final   Coronavirus NL63 NOT DETECTED NOT DETECTED Final   Coronavirus OC43 NOT DETECTED NOT DETECTED  Final   Metapneumovirus NOT DETECTED NOT DETECTED Final   Rhinovirus / Enterovirus NOT DETECTED NOT DETECTED Final   Influenza A NOT DETECTED NOT DETECTED Final   Influenza B NOT DETECTED NOT DETECTED Final   Parainfluenza Virus 1 NOT DETECTED NOT DETECTED Final   Parainfluenza Virus 2 NOT DETECTED NOT DETECTED Final   Parainfluenza Virus 3 NOT DETECTED NOT DETECTED Final   Parainfluenza Virus 4 NOT DETECTED NOT DETECTED Final   Respiratory Syncytial Virus NOT DETECTED NOT DETECTED Final   Bordetella pertussis NOT DETECTED NOT DETECTED Final   Chlamydophila pneumoniae NOT DETECTED NOT DETECTED Final   Mycoplasma pneumoniae NOT DETECTED NOT DETECTED Final  MRSA PCR Screening     Status: None   Collection Time: 05/23/17  3:20 PM  Result Value Ref Range Status   MRSA by PCR NEGATIVE NEGATIVE Final    Comment:        The GeneXpert MRSA Assay (FDA approved for NASAL specimens only), is one component of a comprehensive MRSA colonization  surveillance program. It is not intended to diagnose MRSA infection nor to guide or monitor treatment for MRSA infections.   Culture, blood (Routine X 2) w Reflex to ID Panel     Status: None (Preliminary result)   Collection Time: 05/23/17  9:02 PM  Result Value Ref Range Status   Specimen Description BLOOD RIGHT ANTECUBITAL  Final   Special Requests   Final    BOTTLES DRAWN AEROBIC AND ANAEROBIC Blood Culture results may not be optimal due to an inadequate volume of blood received in culture bottles   Culture   Final    NO GROWTH 3 DAYS Performed at [facility], [place] [address], [place], [place] [address]    Report Status PENDING  Incomplete         Radiology Studies: No results found.      Scheduled Meds: . apixaban  2.5 mg Oral BID  . aspirin  81 mg Oral Daily  . buPROPion  100 mg Oral BID  . diltiazem  30 mg Oral Q6H  . feeding supplement  1 Container Oral TID BM  . ipratropium-albuterol  3 mL Nebulization TID  . mouth  rinse  15 mL Mouth Rinse BID  . metoprolol succinate  50 mg Oral QHS  . metoprolol succinate  50 mg Oral Daily  . mometasone-formoterol  2 puff Inhalation BID  . multivitamin with minerals  1 tablet Oral Daily  . nicotine  21 mg Transdermal Daily  . rosuvastatin  20 mg Oral q1800  . senna-docusate  2 tablet Oral BID  . sodium chloride flush  3 mL Intravenous Q12H   Continuous Infusions:   LOS: 4 days    Azalee Weimer Tanna Furry, MD [facility] Pager [phone]  If 7PM-7AM, please contact night-coverage [email or web address] Password [email or web address] 05/26/2017, 1:45 PM

## 2017-05-26 NOTE — Progress Notes (Signed)
Progress Note  Patient Name: [name] Date of Encounter: 05/26/2017  Primary Cardiologist: Dr Rayann Heman  Subjective   Denies CP or dyspnea this PM  Inpatient Medications    Scheduled Meds: . apixaban  2.5 mg Oral BID  . aspirin  81 mg Oral Daily  . buPROPion  100 mg Oral BID  . diltiazem  30 mg Oral Q6H  . feeding supplement  1 Container Oral TID BM  . ipratropium-albuterol  3 mL Nebulization TID  . mouth rinse  15 mL Mouth Rinse BID  . metoprolol succinate  50 mg Oral QHS  . metoprolol succinate  50 mg Oral Daily  . mometasone-formoterol  2 puff Inhalation BID  . multivitamin with minerals  1 tablet Oral Daily  . nicotine  21 mg Transdermal Daily  . rosuvastatin  20 mg Oral q1800  . senna-docusate  2 tablet Oral BID  . sodium chloride flush  3 mL Intravenous Q12H   Continuous Infusions:  PRN Meds: acetaminophen **OR** acetaminophen, albuterol, alum & mag hydroxide-simeth, diphenhydrAMINE, gi cocktail, ondansetron **OR** ondansetron (ZOFRAN) IV, oxyCODONE-acetaminophen **AND** oxyCODONE, polyethylene glycol   Vital Signs    Vitals:   05/26/17 0800 05/26/17 1000 05/26/17 1100 05/26/17 1200  BP: 105/82 104/69 103/70   Pulse: 67 (!) 115 95   Resp: 20 19 12    Temp: (!) 97.5 F (36.4 C)   97.9 F (36.6 C)  TempSrc: Axillary   Axillary  SpO2: 100% 100% 100%   Weight:      Height:        Intake/Output Summary (Last 24 hours) at 05/26/2017 1359 Last data filed at 05/26/2017 1100 Gross per 24 hour  Intake 580 ml  Output 600 ml  Net -20 ml   Filed Weights   05/22/17 1833 05/25/17 0400 05/26/17 0500  Weight: 130 lb (59 kg) 135 lb 9.3 oz (61.5 kg) 136 lb 3.9 oz (61.8 kg)    Telemetry    Atrial fibration; rate upper normal - Personally Reviewed   Physical Exam   GEN: No acute distress.   Neck: No JVD Cardiac: irregular Respiratory: Diminished BS GI: Soft, nontender, non-distended  MS: No edema; No deformity. Neuro:  Nonfocal  Psych: Normal affect    Labs    Chemistry Recent Labs  Lab 05/22/17 2126  05/24/17 0013 05/25/17 0346 05/26/17 0331  NA 142   < > 138 133* 136  K 4.6   < > 5.0 4.9 4.9  CL 111   < > 106 104 105  CO2 23   < > 22 23 25   GLUCOSE 88   < > 134* 144* 101*  BUN 35*   < > 38* 40* 41*  CREATININE 1.38*   < > 1.62* 1.46* 1.55*  CALCIUM 9.2   < > 9.0 8.7* 9.3  PROT 7.2  --   --   --   --   ALBUMIN 4.0  --   --   --   --   AST 21  --   --   --   --   ALT 11*  --   --   --   --   ALKPHOS 72  --   --   --   --   BILITOT 1.5*  --   --   --   --   GFRNONAA 49*   < > 40* 46* 43*  GFRAA 57*   < > 47* 53* 50*  ANIONGAP 8   < >  10 6 6    < > = values in this interval not displayed.     Hematology Recent Labs  Lab 05/22/17 1822 05/23/17 0816 05/24/17 0013  WBC 11.5* 7.9 10.0  RBC 5.40 5.00 4.42  HGB 16.9 15.6 13.3  HCT 49.3 45.5 40.4  MCV 91.3 91.0 91.4  MCH 31.3 31.2 30.1  MCHC 34.3 34.3 32.9  RDW 15.0 15.0 15.0  PLT 154 140* 149*    Cardiac Enzymes Recent Labs  Lab 05/23/17 1254 05/23/17 1853 05/24/17 0013  TROPONINI <0.03 <0.03 <0.03    BNP Recent Labs  Lab 05/22/17 1822  BNP 122.7*     Patient Profile     72 y.o. male admitted with shortness of breath and found to have atrial fibrillation with a rapid rate .  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  Continue Toprol at present dose.  Change Cardizem to CD.  Continue apixaban.  Note he weighs 61 kg and if this remains stable would need 5 mg twice daily at discharge.  2 noncompliance-we discussed the importance of taking his  medications and he is agreeable.  3 nonischemic cardiomyopathy-this may be tachycardia mediated.  Continue present medications.  Cannot add an ARB as blood pressure is borderline.  Can reassess LV function as an outpatient if patient compliant with medications and rate is controlled.  4 COPD-management per primary service.  For questions or updates, please contact [place] Please consult [email or web address] for contact info under Cardiology/STEMI.      Signed, Kirk Ruths, MD  05/26/2017, 1:59 PM

## 2017-05-26 NOTE — Care Management Note (Signed)
Case Management Note  Patient Details  Name: [name] MRN: [id number] Date of Birth: Apr 27, 1946  Subjective/Objective:                  Copd, a.fib,   Action/Plan: Date:  May 26, 2017 Chart reviewed for concurrent status and case management needs.  Will continue to follow patient progress.  Discharge Planning: following for needs  Expected discharge date: May 29 2017 Velva Harman, BSN, [place], [place]   Expected Discharge Date:  (unknown)               Expected Discharge Plan:  Home/Self Care  In-House Referral:     Discharge planning Services  CM Consult  Post Acute Care Choice:    Choice offered to:     DME Arranged:    DME Agency:     HH Arranged:    HH Agency:     Status of Service:  In process, will continue to follow  If discussed at Long Length of Stay Meetings, dates discussed:    Additional Comments:  Leeroy Cha, RN 05/26/2017, 9:24 AM

## 2017-05-27 LAB — GLUCOSE, CAPILLARY: Glucose-Capillary: 143 mg/dL — ABNORMAL HIGH (ref 65–99)

## 2017-05-27 MED ORDER — APIXABAN 5 MG PO TABS: 5.0000 mg | ORAL_TABLET | Freq: Two times a day (BID) | ORAL | 0 refills | Status: DC

## 2017-05-27 MED ORDER — ALBUTEROL SULFATE HFA 108 (90 BASE) MCG/ACT IN AERS: 2.0000 | INHALATION_SPRAY | Freq: Four times a day (QID) | RESPIRATORY_TRACT | 0 refills | Status: DC | PRN

## 2017-05-27 MED ORDER — BUPROPION HCL ER (SR) 100 MG PO TB12: 100.0000 mg | ORAL_TABLET | Freq: Two times a day (BID) | ORAL | 0 refills | Status: DC

## 2017-05-27 MED ORDER — TIOTROPIUM BROMIDE MONOHYDRATE 18 MCG IN CAPS: 18.0000 ug | ORAL_CAPSULE | Freq: Every day | RESPIRATORY_TRACT | 0 refills | Status: DC

## 2017-05-27 MED ORDER — ROSUVASTATIN CALCIUM 20 MG PO TABS: ORAL_TABLET | ORAL | 0 refills | Status: DC

## 2017-05-27 MED ORDER — APIXABAN 5 MG PO TABS: 5.0000 mg | ORAL_TABLET | Freq: Two times a day (BID) | ORAL | Status: DC

## 2017-05-27 MED ORDER — METOPROLOL SUCCINATE ER 50 MG PO TB24: 50.0000 mg | ORAL_TABLET | Freq: Two times a day (BID) | ORAL | 0 refills | Status: DC

## 2017-05-27 MED ORDER — DILTIAZEM HCL ER COATED BEADS 120 MG PO CP24: 120.0000 mg | ORAL_CAPSULE | Freq: Every day | ORAL | 0 refills | Status: DC

## 2017-05-27 NOTE — Progress Notes (Signed)
PT Cancellation Note  Patient Details Name: [name] MRN: [id number] DOB: [birth year]   Cancelled Treatment:     PT attempted but pt refusing to participate this am.  "I'm not moving until after lunch and you have to do what the patient wants."  Will follow and re-attempt as schedule allows.    [name] 05/27/2017, 11:34 AM

## 2017-05-27 NOTE — Progress Notes (Signed)
OT Cancellation Note  Patient Details Name: [name] MRN: [id number] DOB: 04-25-1946   Cancelled Treatment:    Reason Eval/Treat Not Completed: Other (comment). Pt declined  - asked therapist to return after he eats lunch. Will attempt later if time allows.   [place], OT/L  [phone] 05/27/2017 05/27/2017, 11:39 AM

## 2017-05-27 NOTE — Progress Notes (Signed)
Per [facility] they are unable to provide HHOT-patient attending agree not needed.

## 2017-05-27 NOTE — Progress Notes (Addendum)
Occupational Therapy Evaluation Patient Details Name: [name] MRN: [id number] DOB: 27-Jun-1945 Today's Date: 05/27/2017    History of Present Illness 72 year old male with history of COPD, atrial fibrillation on Eliquis, chronic kidney disease stage III, depression and chronic pain presented with shortness of breath, cough, fever and chills.  He was admitted with COPD exacerbation and atrial fibrillation with rapid ventricular rate    Clinical Impression   PTA, pt lived in a Senior Apt and was modified independent @ RW or w/c level. Pt states he has difficulty completing his ADL and mobility doe to his BLE weakness and numbness. States he has a Industrial/product designer who assists him at times. Discussed the home situation and pt states he stopped taking his medication last summer because he "couldn't afford it and was too hard to keep up with ". States sometimes "I don't eat because it is too hard sometimes  to fix food". Pt would benefit from HHOT/PT/Nurses Aide and SW. Educated pt on setting an alarm on his phone for medication management and to discuss simplifying his scheduled medications with his pharmacist. Recommend HHSW to assist with community resources I.e. Meals on Wheels to assist with meals. Discussed with CM.     Follow Up Recommendations  Home health OT;Supervision - Intermittent(HH Aide; HHSW)    Equipment Recommendations  3 in 1 bedside commode    Recommendations for Other Services       Precautions / Restrictions Precautions Precautions: Fall      Mobility Bed Mobility Overal bed mobility: Modified Independent                Transfers                      Balance Overall balance assessment: History of Falls                                         ADL either performed or assessed with clinical judgement   ADL Overall ADL's : Needs assistance/impaired     Grooming: Set up;Sitting   Upper Body Bathing: Set up;Sitting   Lower Body  Bathing: Minimal assistance;Sit to/from stand   Upper Body Dressing : Set up;Sitting   Lower Body Dressing: Minimal assistance;Sit to/from stand   Toilet Transfer: RW;BSC;Min guard   Toileting- Water quality scientist and Hygiene: Supervision/safety       Functional mobility during ADLs: Rolling walker;Min guard  Recommend pt follow up with his foot doctor to assess foot pain and possible use of orthotics to help reduce pain. Pt verbalized understanding.     Vision         Perception     Praxis      Pertinent Vitals/Pain Pain Assessment: Faces Faces Pain Scale: Hurts a little bit Pain Location: B feet Pain Descriptors / Indicators: Burning;Pins and needles Pain Intervention(s): Limited activity within patient's tolerance     Hand Dominance     Extremity/Trunk Assessment Upper Extremity Assessment Upper Extremity Assessment: Generalized weakness   Lower Extremity Assessment Lower Extremity Assessment: Generalized weakness   Cervical / Trunk Assessment Cervical / Trunk Assessment: Normal   Communication Communication Communication: No difficulties   Cognition Arousal/Alertness: Awake/alert Behavior During Therapy: WFL for tasks assessed/performed Overall Cognitive Status: Within Functional Limits for tasks assessed  General Comments       Exercises     Shoulder Instructions      Home Living Family/patient expects to be discharged to:: Private residence Living Arrangements: Alone Available Help at Discharge: Family;Available PRN/intermittently Type of Home: Apartment Home Access: Elevator     Home Layout: One level     Bathroom Shower/Tub: Occupational psychologist: Standard Bathroom Accessibility: Yes How Accessible: Accessible via walker;Accessible via wheelchair Home Equipment: Gilford Rile - 4 wheels;Walker - 2 wheels;Cane - single point;Wheelchair - Education administrator (comment);Cane - quad;Shower  seat;Grab bars - toilet;Grab bars - tub/shower          Prior Functioning/Environment Level of Independence: Independent with assistive device(s)        Comments: Pt staets completing ADL tasks are difficult and sometimes he doesn't "eat becuae it is too hard"        OT Problem List: Decreased strength;Decreased activity tolerance;Impaired balance (sitting and/or standing);Decreased safety awareness;Decreased knowledge of use of DME or AE;Impaired sensation;Pain      OT Treatment/Interventions:      OT Goals(Current goals can be found in the care plan section) Acute Rehab OT Goals Patient Stated Goal: to get some help OT Goal Formulation: All assessment and education complete, DC therapy  OT Frequency:     Barriers to D/C:            Co-evaluation              AM-PAC PT "6 Clicks" Daily Activity     Outcome Measure Help from another person eating meals?: None Help from another person taking care of personal grooming?: None Help from another person toileting, which includes using toliet, bedpan, or urinal?: A Little Help from another person bathing (including washing, rinsing, drying)?: A Little Help from another person to put on and taking off regular upper body clothing?: None Help from another person to put on and taking off regular lower body clothing?: A Little 6 Click Score: 21   End of Session Equipment Utilized During Treatment: Gait belt;Rolling walker Nurse Communication: Mobility status;Other (comment)(DC needs)  Activity Tolerance: Patient tolerated treatment well Patient left: in chair;with call bell/phone within reach  OT Visit Diagnosis: Unsteadiness on feet (R26.81);Other abnormalities of gait and mobility (R26.89);Muscle weakness (generalized) (M62.81);History of falling (Z91.81)                Time: 9030-0923 OT Time Calculation (min): 25 min Charges:  OT General Charges $OT Visit: 1 Visit OT Evaluation $OT Eval Moderate Complexity: 1  Mod OT Treatments $Self Care/Home Management : 8-22 mins G-Codes:     [facility], OT/L  [phone] 05/27/2017  Cari Vandeberg,HILLARY 05/27/2017, 2:01 PM

## 2017-05-27 NOTE — Care Management Note (Signed)
Case Management Note  Patient Details  Name: [name] MRN: [id number] Date of Birth: July 11, 1945  Subjective/Objective: PT recc HHC. Ordered for HHPT/OT/aide-spoke to patient about d/c plans & recc-chose [facility] rep Santiago Glad aware of [place] & d/c today. Patient provided w/goodrx discount website for meds. Patient voiced understanding.                   Action/Plan:d/c home w/HHC.   Expected Discharge Date:  05/27/17               Expected Discharge Plan:  [place]  In-House Referral:     Discharge planning Services  CM Consult  Post Acute Care Choice:    Choice offered to:  Patient  DME Arranged:    DME Agency:     HH Arranged:  PT, OT, Nurse's Aide [place] Agency:  [place]  Status of Service:  Completed, signed off  If discussed at [place] of Stay Meetings, dates discussed:    Additional Comments:  Dessa Phi, RN 05/27/2017, 12:26 PM

## 2017-05-27 NOTE — Progress Notes (Addendum)
Progress Note  Patient Name: [name] Date of Encounter: 05/27/2017  Primary Cardiologist: No primary care provider on file.   Subjective   Still SOB, 1 episode of transient chest pain last night which lasted only a min  Inpatient Medications    Scheduled Meds: . apixaban  2.5 mg Oral BID  . aspirin EC  81 mg Oral Daily  . buPROPion  100 mg Oral BID  . diltiazem  120 mg Oral Daily  . feeding supplement  1 Container Oral TID BM  . ipratropium-albuterol  3 mL Nebulization TID  . mouth rinse  15 mL Mouth Rinse BID  . metoprolol succinate  50 mg Oral QHS  . metoprolol succinate  50 mg Oral Daily  . mometasone-formoterol  2 puff Inhalation BID  . multivitamin with minerals  1 tablet Oral Daily  . nicotine  21 mg Transdermal Daily  . rosuvastatin  20 mg Oral q1800  . senna-docusate  2 tablet Oral BID  . sodium chloride flush  3 mL Intravenous Q12H   Continuous Infusions:  PRN Meds: acetaminophen **OR** acetaminophen, albuterol, alum & mag hydroxide-simeth, diphenhydrAMINE, gi cocktail, ondansetron **OR** ondansetron (ZOFRAN) IV, oxyCODONE-acetaminophen **AND** oxyCODONE, polyethylene glycol   Vital Signs    Vitals:   05/26/17 1830 05/26/17 1954 05/26/17 2319 05/27/17 0558  BP: 111/80  111/66 110/69  Pulse: (!) 112  87 77  Resp: 20  20 18   Temp: 97.9 F (36.6 C)  98.1 F (36.7 C) 98 F (36.7 C)  TempSrc: Oral  Oral Oral  SpO2: 100% 92% 99% 100%  Weight:    134 lb 7.7 oz (61 kg)  Height:        Intake/Output Summary (Last 24 hours) at 05/27/2017 0847 Last data filed at 05/27/2017 0744 Gross per 24 hour  Intake 600 ml  Output 1225 ml  Net -625 ml   Filed Weights   05/25/17 0400 05/26/17 0500 05/27/17 0558  Weight: 135 lb 9.3 oz (61.5 kg) 136 lb 3.9 oz (61.8 kg) 134 lb 7.7 oz (61 kg)    Telemetry    afib with HR 90s - Personally Reviewed  ECG    Aflutter with RVR vs course afib - Personally Reviewed  Physical Exam   GEN: No acute distress.   Neck:  No JVD Cardiac: RRR, no murmurs, rubs, or gallops.  PPM present in right upper chest Respiratory: diffusely diminished breath sound, no crackle, markedly diminished breath sound in L base GI: Soft, nontender, non-distended  MS: No edema; No deformity. Neuro:  Nonfocal  Psych: Normal affect   Labs    Chemistry Recent Labs  Lab 05/22/17 2126  05/24/17 0013 05/25/17 0346 05/26/17 0331  NA 142   < > 138 133* 136  K 4.6   < > 5.0 4.9 4.9  CL 111   < > 106 104 105  CO2 23   < > 22 23 25   GLUCOSE 88   < > 134* 144* 101*  BUN 35*   < > 38* 40* 41*  CREATININE 1.38*   < > 1.62* 1.46* 1.55*  CALCIUM 9.2   < > 9.0 8.7* 9.3  PROT 7.2  --   --   --   --   ALBUMIN 4.0  --   --   --   --   AST 21  --   --   --   --   ALT 11*  --   --   --   --  ALKPHOS 72  --   --   --   --   BILITOT 1.5*  --   --   --   --   GFRNONAA 49*   < > 40* 46* 43*  GFRAA 57*   < > 47* 53* 50*  ANIONGAP 8   < > 10 6 6    < > = values in this interval not displayed.     Hematology Recent Labs  Lab 05/22/17 1822 05/23/17 0816 05/24/17 0013  WBC 11.5* 7.9 10.0  RBC 5.40 5.00 4.42  HGB 16.9 15.6 13.3  HCT 49.3 45.5 40.4  MCV 91.3 91.0 91.4  MCH 31.3 31.2 30.1  MCHC 34.3 34.3 32.9  RDW 15.0 15.0 15.0  PLT 154 140* 149*    Cardiac Enzymes Recent Labs  Lab 05/23/17 1254 05/23/17 1853 05/24/17 0013  TROPONINI <0.03 <0.03 <0.03   No results for input(s): TROPIPOC in the last 168 hours.   BNP Recent Labs  Lab 05/22/17 1822  BNP 122.7*     DDimer No results for input(s): DDIMER in the last 168 hours.   Radiology    No results found.  Cardiac Studies   Echo 08/14/2015  - Left ventricle: The cavity size was moderately dilated. Wall   thickness was normal. The estimated ejection fraction was 30%.   Diffuse hypokinesis. - Aortic valve: There was mild regurgitation. - Mitral valve: There was mild to moderate regurgitation. - Left atrium: The atrium was moderately dilated. - Right atrium:  The atrium was mildly dilated. - Tricuspid valve: There was mild-moderate regurgitation.  Patient Profile     72 y.o. 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 Cardiology consulted for afib  Assessment & Plan    1. Longstanding persistent atrial fibrillation: his weight is 61 kg, Cr 1.55, and age 72, near the borderline for criteria between 5mg  and 2.5mg  eliquis. HR 90s on low dose diltiazem CD, 50mg  BID toprol XL.   2. Chest pain: only 1 episode of chest pain last night, transient, lasted 1 min. Repeat EKG, not a good candidate for invasive study due to noncompliance  3. Noncompliance: need better compliance.   4. NICM: felt to be tachy mediated cardiomyopathy. Will need to add ARB as outpatient.   5. COPD: still SOB, per IM  6. Tachybrady s/p PPM: managed by Dr. Rayann Heman   For questions or updates, please contact [facility] Please consult [email or web address] for contact info under Cardiology/STEMI.      Hilbert Corrigan, Johnson  05/27/2017, 8:47 AM   As above, patient seen and examined.  Patient had brief episode of chest pain last evening that does not sound cardiac.  His dyspnea has improved.  His heart rate is reasonably well controlled after personally reviewing telemetry.  We will continue with Cardizem and Toprol.  Continue apixaban.  Would increase dose to 5 mg twice daily.  Patient does have a cardiomyopathy.  Question tachycardia mediated.  We cannot add an ARB as blood pressure is borderline.  Can reassess LV function in 3-6 months if patient demonstrates compliance with medications.  We again  discussed the importance of compliance.  Patient can be discharged from a cardiac standpoint and follow-up with Dr. Rayann Heman. Kirk Ruths, MD

## 2017-05-27 NOTE — Discharge Summary (Addendum)
Physician Discharge Summary  [name] YNW:[id number] DOB: August 10, 1945 DOA: 05/22/2017  PCP: Zada Finders, MD  Admit date: 05/22/2017 Discharge date: 05/27/2017  Admitted From:home Disposition:home  Recommendations for Outpatient Follow-up:  1. Follow up with PCP in 1-2 weeks 2. Please obtain BMP/CBC in one week   Home Health:yes Equipment/Devices:none Discharge Condition:stable CODE STATUS:partial DNR Diet recommendation:heart healthy  Brief/Interim Summary: 72 year old male with history of COPD, atrial fibrillation on Eliquis, chronic kidney disease stage III, depression and chronic pain presented with shortness of breath,cough, fever and chills. He was admitted with COPD exacerbation and atrial fibrillation with rapid ventricular rate and started on Solu-Medrol and Cardizem drip. Patient is noncompliant with medication.  #COPD exacerbation with acute respiratory failure with hypoxia: Treated with bronchodilators and prednisone.    Clinically improved.  Denied nausea vomiting chest pain shortness of breath.  Discharging with bronchodilators and outpatient follow-up with PCP.  #Atrial fibrillation with RVR in the setting of noncompliance with medication: Patient reported that he was not taking medicine since August because of pills burden.  He has a history of noncompliance with the medication. -Dilated by cardiologist.  On diltiazem and metoprolol.  On Eliquis for anticoagulation.  Heart rate is better controlled.  Recommended to follow-up with his cardiologist outpatient.  Patient was educated on taking medications as prescribed.  #Chronic kidney disease stage III: Stable.  Serum creatinine level around baseline.  Monitor BMP.  #Anxiety depression: Continue home medication.  Mood is stable.  Patient is clinically improved.  Heart rate is improved.  Denies any symptoms.  Verbalized understanding that he needs to take medication as prescribed.  Home care services ordered and  arranged.  Medication prescription provided.  At this time patient is medically stable to transfer his care to outpatient.  Discussed with the case manager as well.  Discharge Diagnoses:  Principal Problem:   COPD with acute exacerbation ([place]) Active Problems:   Major depressive disorder, recurrent episode (HCC)   Atrial fibrillation with RVR (HCC)   CKD (chronic kidney disease) stage 3, GFR 30-59 ml/min (HCC)   Chronic respiratory failure with hypoxia (HCC) severe protein calorie malnutrition   Discharge Instructions  Discharge Instructions    Call MD for:  difficulty breathing, headache or visual disturbances   Complete by:  As directed    Call MD for:  extreme fatigue   Complete by:  As directed    Call MD for:  hives   Complete by:  As directed    Call MD for:  persistant dizziness or light-headedness   Complete by:  As directed    Call MD for:  persistant nausea and vomiting   Complete by:  As directed    Call MD for:  severe uncontrolled pain   Complete by:  As directed    Call MD for:  temperature >100.4   Complete by:  As directed    Diet - low sodium heart healthy   Complete by:  As directed    Increase activity slowly   Complete by:  As directed      Allergies as of 05/27/2017      Reactions   Amiodarone Other (See Comments)   Thyroiditis in the setting of amiodarone use   Penicillins Rash, Other (See Comments)   Has patient had a PCN reaction causing immediate rash, facial/tongue/throat swelling, SOB or lightheadedness with hypotension: Yes Has patient had a PCN reaction causing severe rash involving mucus membranes or skin necrosis: No Has patient had a PCN reaction that  required hospitalization No Has patient had a PCN reaction occurring within the last 10 years: No If all of the above answers are "NO", then may proceed with Cephalosporin use.      Medication List    STOP taking these medications   midodrine 5 MG tablet Commonly known as:  PROAMATINE      TAKE these medications   albuterol 108 (90 Base) MCG/ACT inhaler Commonly known as:  VENTOLIN HFA Inhale 2 puffs into the lungs every 6 (six) hours as needed for wheezing or shortness of breath.   apixaban 5 MG Tabs tablet Commonly known as:  ELIQUIS Take 1 tablet (5 mg total) by mouth 2 (two) times daily. What changed:    medication strength  how much to take   aspirin 81 MG chewable tablet Chew 1 tablet (81 mg total) by mouth daily.   buPROPion 100 MG 12 hr tablet Commonly known as:  WELLBUTRIN SR Take 1 tablet (100 mg total) by mouth 2 (two) times daily.   diltiazem 120 MG 24 hr capsule Commonly known as:  CARDIZEM CD Take 1 capsule (120 mg total) by mouth daily. Start taking on:  05/28/2017   feeding supplement Liqd Take 1 Container by mouth 3 (three) times daily between meals.   metoprolol succinate 50 MG 24 hr tablet Commonly known as:  TOPROL-XL Take 1 tablet (50 mg total) by mouth every 12 (twelve) hours. Take with or immediately following a meal. What changed:    how much to take  how to take this  when to take this  additional instructions   oxyCODONE-acetaminophen 10-325 MG tablet Commonly known as:  PERCOCET Take 1 tablet by mouth every 4 (four) hours as needed for pain. Do not take more than 5 tablets in 24 hours.   polyethylene glycol packet Commonly known as:  MIRALAX / GLYCOLAX Take 17 g by mouth daily as needed.   rosuvastatin 20 MG tablet Commonly known as:  CRESTOR TAKE 1 TABLET EVERY DAY AT 6 PM   senna-docusate 8.6-50 MG tablet Commonly known as:  Senokot-S Take 2 tablets by mouth 2 (two) times daily.   tiotropium 18 MCG inhalation capsule Commonly known as:  SPIRIVA Place 1 capsule (18 mcg total) into inhaler and inhale daily.      Follow-up Information    Allred, Jeneen Rinks, MD. Schedule an appointment as soon as possible for a visit in 2 week(s).   Specialty:  Cardiology Contact information: [address] [place] [address] [phone]        Zada Finders, MD. Schedule an appointment as soon as possible for a visit in 1 week(s).   Specialty:  Internal Medicine Contact information: [place] [phone]          Allergies  Allergen Reactions  . Amiodarone Other (See Comments)    Thyroiditis in the setting of amiodarone use  . Penicillins Rash and Other (See Comments)    Has patient had a PCN reaction causing immediate rash, facial/tongue/throat swelling, SOB or lightheadedness with hypotension: Yes Has patient had a PCN reaction causing severe rash involving mucus membranes or skin necrosis: No Has patient had a PCN reaction that required hospitalization No Has patient had a PCN reaction occurring within the last 10 years: No If all of the above answers are "NO", then may proceed with Cephalosporin use.    Consultations: Cardiology  Procedures/Studies: None  Subjective: Seen and examined at bedside.  Denied nausea vomiting  headache dizziness.  No chest pain or shortness of breath.  No coughing.  Discharge Exam: Vitals:   05/27/17 0558 05/27/17 0907  BP: 110/69   Pulse: 77 78  Resp: 18 17  Temp: 98 F (36.7 C)   SpO2: 100% 98%   Vitals:   05/26/17 1954 05/26/17 2319 05/27/17 0558 05/27/17 0907  BP:  111/66 110/69   Pulse:  87 77 78  Resp:  20 18 17   Temp:  98.1 F (36.7 C) 98 F (36.7 C)   TempSrc:  Oral Oral   SpO2: 92% 99% 100% 98%  Weight:   61 kg (134 lb 7.7 oz)   Height:        General: Pt is alert, awake, not in acute distress Cardiovascular: Irregularly irregular heart rate, S1/S2 +, no rubs, no gallops Respiratory: CTA bilaterally, no wheezing, no rhonchi Abdominal: Soft, NT, ND, bowel sounds + Extremities: no edema, no cyanosis    The results of significant diagnostics from this hospitalization (including imaging, microbiology, ancillary and laboratory) are listed below for reference.      Microbiology: Recent Results (from the past 240 hour(s))  Culture, blood (Routine X 2) w Reflex to ID Panel     Status: None (Preliminary result)   Collection Time: 05/23/17 12:55 PM  Result Value Ref Range Status   Specimen Description BLOOD LEFT FOREARM  Final   Special Requests   Final    BOTTLES DRAWN AEROBIC AND ANAEROBIC Blood Culture adequate volume   Culture   Final    NO GROWTH 3 DAYS Performed at [facility], [address], [place], [place] [address]    Report Status PENDING  Incomplete  Respiratory Panel by PCR     Status: None   Collection Time: 05/23/17  3:17 PM  Result Value Ref Range Status   Adenovirus NOT DETECTED NOT DETECTED Final   Coronavirus 229E NOT DETECTED NOT DETECTED Final   Coronavirus HKU1 NOT DETECTED NOT DETECTED Final   Coronavirus NL63 NOT DETECTED NOT DETECTED Final   Coronavirus OC43 NOT DETECTED NOT DETECTED Final   Metapneumovirus NOT DETECTED NOT DETECTED Final   Rhinovirus / Enterovirus NOT DETECTED NOT DETECTED Final   Influenza A NOT DETECTED NOT DETECTED Final   Influenza B NOT DETECTED NOT DETECTED Final   Parainfluenza Virus 1 NOT DETECTED NOT DETECTED Final   Parainfluenza Virus 2 NOT DETECTED NOT DETECTED Final   Parainfluenza Virus 3 NOT DETECTED NOT DETECTED Final   Parainfluenza Virus 4 NOT DETECTED NOT DETECTED Final   Respiratory Syncytial Virus NOT DETECTED NOT DETECTED Final   Bordetella pertussis NOT DETECTED NOT DETECTED Final   Chlamydophila pneumoniae NOT DETECTED NOT DETECTED Final   Mycoplasma pneumoniae NOT DETECTED NOT DETECTED Final  MRSA PCR Screening     Status: None   Collection Time: 05/23/17  3:20 PM  Result Value Ref Range Status   MRSA by PCR NEGATIVE NEGATIVE Final    Comment:        The GeneXpert MRSA Assay (FDA approved for NASAL specimens only), is one component of a comprehensive MRSA colonization surveillance program. It is not intended to diagnose MRSA infection nor to guide  or monitor treatment for MRSA infections.   Culture, blood (Routine X 2) w Reflex to ID Panel     Status: None (Preliminary result)   Collection Time: 05/23/17  9:02 PM  Result Value Ref Range Status   Specimen Description BLOOD RIGHT ANTECUBITAL  Final   Special Requests  Final    BOTTLES DRAWN AEROBIC AND ANAEROBIC Blood Culture results may not be optimal due to an inadequate volume of blood received in culture bottles   Culture   Final    NO GROWTH 3 DAYS Performed at [facility], [place] [address], [place], [place] [address]    Report Status PENDING  Incomplete     Labs: BNP (last 3 results) Recent Labs    10/08/16 1610 10/30/16 1753 05/22/17 1822  BNP 232.7* 586.7* 630.1*   Basic Metabolic Panel: Recent Labs  Lab 05/22/17 2126 05/23/17 0816 05/24/17 0013 05/25/17 0346 05/26/17 0331  NA 142 139 138 133* 136  K 4.6 4.5 5.0 4.9 4.9  CL 111 109 106 104 105  CO2 23 21* 22 23 25   GLUCOSE 88 184* 134* 144* 101*  BUN 35* 36* 38* 40* 41*  CREATININE 1.38* 1.36* 1.62* 1.46* 1.55*  CALCIUM 9.2 9.4 9.0 8.7* 9.3   Liver Function Tests: Recent Labs  Lab 05/22/17 2126  AST 21  ALT 11*  ALKPHOS 72  BILITOT 1.5*  PROT 7.2  ALBUMIN 4.0   No results for input(s): LIPASE, AMYLASE in the last 168 hours. No results for input(s): AMMONIA in the last 168 hours. CBC: Recent Labs  Lab 05/22/17 1822 05/23/17 0816 05/24/17 0013  WBC 11.5* 7.9 10.0  NEUTROABS 8.1* 7.1  --   HGB 16.9 15.6 13.3  HCT 49.3 45.5 40.4  MCV 91.3 91.0 91.4  PLT 154 140* 149*   Cardiac Enzymes: Recent Labs  Lab 05/23/17 1254 05/23/17 1853 05/24/17 0013  TROPONINI <0.03 <0.03 <0.03   BNP: Invalid input(s): POCBNP CBG: Recent Labs  Lab 05/23/17 0754 05/24/17 0758 05/25/17 0744 05/26/17 0743 05/27/17 0739  GLUCAP 189* 125* 109* 135* 143*   D-Dimer No results for input(s): DDIMER in the last 72 hours. Hgb A1c No results for input(s): HGBA1C in the last 72 hours. Lipid  Profile No results for input(s): CHOL, HDL, LDLCALC, TRIG, CHOLHDL, LDLDIRECT in the last 72 hours. Thyroid function studies No results for input(s): TSH, T4TOTAL, T3FREE, THYROIDAB in the last 72 hours.  Invalid input(s): FREET3 Anemia work up No results for input(s): VITAMINB12, FOLATE, FERRITIN, TIBC, IRON, RETICCTPCT in the last 72 hours. Urinalysis    Component Value Date/Time   COLORURINE AMBER (A) 10/30/2016 1902   APPEARANCEUR HAZY (A) 10/30/2016 1902   APPEARANCEUR Clear 06/20/2015 1405   LABSPEC 1.025 10/30/2016 1902   PHURINE 5.0 10/30/2016 1902   GLUCOSEU NEGATIVE 10/30/2016 1902   GLUCOSEU NEG mg/dL 12/04/2009 2201   HGBUR NEGATIVE 10/30/2016 1902   HGBUR trace-intact 09/01/2008 1533   BILIRUBINUR NEGATIVE 10/30/2016 1902   BILIRUBINUR Negative 06/20/2015 1405   KETONESUR 5 (A) 10/30/2016 1902   PROTEINUR NEGATIVE 10/30/2016 1902   UROBILINOGEN 1.0 11/11/2014 1518   NITRITE NEGATIVE 10/30/2016 1902   LEUKOCYTESUR NEGATIVE 10/30/2016 1902   LEUKOCYTESUR Negative 06/20/2015 1405   Sepsis Labs Invalid input(s): PROCALCITONIN,  WBC,  LACTICIDVEN Microbiology Recent Results (from the past 240 hour(s))  Culture, blood (Routine X 2) w Reflex to ID Panel     Status: None (Preliminary result)   Collection Time: 05/23/17 12:55 PM  Result Value Ref Range Status   Specimen Description BLOOD LEFT FOREARM  Final   Special Requests   Final    BOTTLES DRAWN AEROBIC AND ANAEROBIC Blood Culture adequate volume   Culture   Final    NO GROWTH 3 DAYS Performed at [facility], [place],  [place] [address]    Report Status PENDING  Incomplete  Respiratory Panel by PCR     Status: None   Collection Time: 05/23/17  3:17 PM  Result Value Ref Range Status   Adenovirus NOT DETECTED NOT DETECTED Final   Coronavirus 229E NOT DETECTED NOT DETECTED Final   Coronavirus HKU1 NOT DETECTED NOT DETECTED Final   Coronavirus NL63 NOT DETECTED NOT DETECTED Final    Coronavirus OC43 NOT DETECTED NOT DETECTED Final   Metapneumovirus NOT DETECTED NOT DETECTED Final   Rhinovirus / Enterovirus NOT DETECTED NOT DETECTED Final   Influenza A NOT DETECTED NOT DETECTED Final   Influenza B NOT DETECTED NOT DETECTED Final   Parainfluenza Virus 1 NOT DETECTED NOT DETECTED Final   Parainfluenza Virus 2 NOT DETECTED NOT DETECTED Final   Parainfluenza Virus 3 NOT DETECTED NOT DETECTED Final   Parainfluenza Virus 4 NOT DETECTED NOT DETECTED Final   Respiratory Syncytial Virus NOT DETECTED NOT DETECTED Final   Bordetella pertussis NOT DETECTED NOT DETECTED Final   Chlamydophila pneumoniae NOT DETECTED NOT DETECTED Final   Mycoplasma pneumoniae NOT DETECTED NOT DETECTED Final  MRSA PCR Screening     Status: None   Collection Time: 05/23/17  3:20 PM  Result Value Ref Range Status   MRSA by PCR NEGATIVE NEGATIVE Final    Comment:        The GeneXpert MRSA Assay (FDA approved for NASAL specimens only), is one component of a comprehensive MRSA colonization surveillance program. It is not intended to diagnose MRSA infection nor to guide or monitor treatment for MRSA infections.   Culture, blood (Routine X 2) w Reflex to ID Panel     Status: None (Preliminary result)   Collection Time: 05/23/17  9:02 PM  Result Value Ref Range Status   Specimen Description BLOOD RIGHT ANTECUBITAL  Final   Special Requests   Final    BOTTLES DRAWN AEROBIC AND ANAEROBIC Blood Culture results may not be optimal due to an inadequate volume of blood received in culture bottles   Culture   Final    NO GROWTH 3 DAYS Performed at [facility], [place] [address], [place], [place] [address]    Report Status PENDING  Incomplete     Time coordinating discharge: 33 minutes  SIGNED:   Rosita Fire, MD  [facility] 05/27/2017, 11:14 AM  If 7PM-7AM, please contact night-coverage [email or web address] Password [email or web address]

## 2017-05-27 NOTE — Progress Notes (Signed)
Noted patient ordered for bedside commode-spoke to patient about BSC-he states he cant afford it/this is a custodial item not covered by insurance-he already has 1 @ home. He is already on the meals on wheels list-his daughter received a call yesterday for services-his daughter will call them today to make sure it is continued. No further CM needs.

## 2017-05-27 NOTE — Care Management Important Message (Signed)
Important Message  Patient Details  Name: [name] MRN: [id number] Date of Birth: 11-Dec-1945   Medicare Important Message Given:  Yes    Kerin Salen 05/27/2017, 10:32 AMImportant Message  Patient Details  Name: [name] MRN: [id number] Date of Birth: 1945-12-17   Medicare Important Message Given:  Yes    Kerin Salen 05/27/2017, 10:32 AM

## 2017-05-28 ENCOUNTER — Telehealth: Payer: Self-pay

## 2017-05-28 ENCOUNTER — Telehealth: Payer: Self-pay | Admitting: Internal Medicine

## 2017-05-28 ENCOUNTER — Other Ambulatory Visit: Payer: Self-pay | Admitting: *Deleted

## 2017-05-28 LAB — CULTURE, BLOOD (ROUTINE X 2)
CULTURE: NO GROWTH
Culture: NO GROWTH
Special Requests: ADEQUATE

## 2017-05-28 NOTE — Telephone Encounter (Signed)
Pt will see dr patel 1/23 at 1515 per dr patel, Margit Banda scheduled appt, pt will call 3 days before prescription for pain med expires

## 2017-05-28 NOTE — Telephone Encounter (Signed)
Would like to speak with Helen. Please call back.  

## 2017-05-28 NOTE — Telephone Encounter (Signed)
Patient would like [name] to callback

## 2017-05-28 NOTE — Telephone Encounter (Signed)
Pt admitted

## 2017-05-29 ENCOUNTER — Other Ambulatory Visit: Payer: Self-pay | Admitting: Pharmacist

## 2017-05-29 DIAGNOSIS — J441 Chronic obstructive pulmonary disease with (acute) exacerbation: Secondary | ICD-10-CM

## 2017-05-29 MED ORDER — ALBUTEROL SULFATE HFA 108 (90 BASE) MCG/ACT IN AERS: 2.0000 | INHALATION_SPRAY | Freq: Four times a day (QID) | RESPIRATORY_TRACT | 0 refills | Status: DC | PRN

## 2017-05-29 MED ORDER — TIOTROPIUM BROMIDE MONOHYDRATE 18 MCG IN CAPS: 18.0000 ug | ORAL_CAPSULE | Freq: Every day | RESPIRATORY_TRACT | 0 refills | Status: DC

## 2017-05-29 NOTE — Progress Notes (Signed)
Spiriva and albuterol were not processed successfully, re-sent prescriptions to [facility].

## 2017-05-30 ENCOUNTER — Other Ambulatory Visit: Payer: Self-pay | Admitting: Internal Medicine

## 2017-05-30 DIAGNOSIS — Z79891 Long term (current) use of opiate analgesic: Secondary | ICD-10-CM

## 2017-05-30 MED ORDER — OXYCODONE-ACETAMINOPHEN 10-325 MG PO TABS: 1.0000 | ORAL_TABLET | ORAL | 0 refills | Status: DC | PRN

## 2017-05-30 NOTE — Telephone Encounter (Signed)
Refill Request.  Patient states he has enough pills to last until 06/01/2017.  pls call pt back.  oxyCODONE-acetaminophen (PERCOCET) 10-325 MG tablet

## 2017-05-30 NOTE — Telephone Encounter (Addendum)
Called / informed pt Percocet rx was refilled /sent to [facility]. [facility] to [facility] [address] - but will be able to pick up rx from [place].

## 2017-05-30 NOTE — Telephone Encounter (Signed)
Rx last written 03/05/17 - 3/3. Next appt 06/11/17 with Dr [name]. UDS 10/16/16.

## 2017-06-11 ENCOUNTER — Encounter: Payer: Medicare Other | Admitting: Internal Medicine

## 2017-06-19 ENCOUNTER — Other Ambulatory Visit: Payer: Self-pay

## 2017-06-19 ENCOUNTER — Encounter: Payer: Self-pay | Admitting: Internal Medicine

## 2017-06-19 ENCOUNTER — Ambulatory Visit (INDEPENDENT_AMBULATORY_CARE_PROVIDER_SITE_OTHER): Payer: Medicare Other | Admitting: Internal Medicine

## 2017-06-19 VITALS — BP 94/70 | HR 53 | Temp 97.7°F | Ht 75.0 in | Wt 131.0 lb

## 2017-06-19 DIAGNOSIS — Z8679 Personal history of other diseases of the circulatory system: Secondary | ICD-10-CM | POA: Diagnosis not present

## 2017-06-19 DIAGNOSIS — R42 Dizziness and giddiness: Secondary | ICD-10-CM

## 2017-06-19 DIAGNOSIS — I10 Essential (primary) hypertension: Secondary | ICD-10-CM

## 2017-06-19 DIAGNOSIS — I482 Chronic atrial fibrillation: Secondary | ICD-10-CM

## 2017-06-19 DIAGNOSIS — Z720 Tobacco use: Secondary | ICD-10-CM

## 2017-06-19 DIAGNOSIS — I4811 Longstanding persistent atrial fibrillation: Secondary | ICD-10-CM

## 2017-06-19 DIAGNOSIS — I5042 Chronic combined systolic (congestive) and diastolic (congestive) heart failure: Secondary | ICD-10-CM

## 2017-06-19 DIAGNOSIS — Z681 Body mass index (BMI) 19 or less, adult: Secondary | ICD-10-CM | POA: Diagnosis not present

## 2017-06-19 DIAGNOSIS — I110 Hypertensive heart disease with heart failure: Secondary | ICD-10-CM

## 2017-06-19 DIAGNOSIS — Z79899 Other long term (current) drug therapy: Secondary | ICD-10-CM

## 2017-06-19 DIAGNOSIS — Z7901 Long term (current) use of anticoagulants: Secondary | ICD-10-CM

## 2017-06-19 DIAGNOSIS — Z9981 Dependence on supplemental oxygen: Secondary | ICD-10-CM | POA: Diagnosis not present

## 2017-06-19 DIAGNOSIS — Z950 Presence of cardiac pacemaker: Secondary | ICD-10-CM

## 2017-06-19 DIAGNOSIS — I428 Other cardiomyopathies: Secondary | ICD-10-CM

## 2017-06-19 DIAGNOSIS — R64 Cachexia: Secondary | ICD-10-CM

## 2017-06-19 DIAGNOSIS — J441 Chronic obstructive pulmonary disease with (acute) exacerbation: Secondary | ICD-10-CM | POA: Diagnosis not present

## 2017-06-19 MED ORDER — APIXABAN 2.5 MG PO TABS: 2.5000 mg | ORAL_TABLET | Freq: Two times a day (BID) | ORAL | 5 refills | Status: DC

## 2017-06-19 MED ORDER — METOPROLOL SUCCINATE ER 100 MG PO TB24: 100.0000 mg | ORAL_TABLET | Freq: Two times a day (BID) | ORAL | 2 refills | Status: DC

## 2017-06-19 MED ORDER — METOPROLOL SUCCINATE ER 50 MG PO TB24: 50.0000 mg | ORAL_TABLET | Freq: Two times a day (BID) | ORAL | 2 refills | Status: DC

## 2017-06-19 NOTE — Progress Notes (Signed)
CC: dizziness  HPI:  [name] is a 72 y.o. with a PMH of chronic a fib, tachy-brady syndrome s/p pacemaker, combined CHF, HTN, NICM, COPD on 2LNC PRN presenting to clinic for hospital follow up for COPD exacerbation and A fib w/ RVR, as well as evaluation of dizziness.  Patient admitted 1/3-1/8 to [facility] for COPD exacerbation treated with prednisone and bronchodilators; he also presented in A fib with RVR treated with diltiazem drip and transitioned back to his home oral medications. Apparently, patient had not been taking any of his medicines for a couple of months prior to presentation to hospital.   Today, patient states that he is compliant with his medications other than taking only once daily metoprolol instead of BID.  He endorses episodes of shortness of breath not relieved with albuterol; episodes occur with dizziness at times when he changes position from lying down to sitting, or from sitting to standing. Episodes occur 3-4 times per week. He endorses good fluid and food intake and has been supplementing with protein shakes.   He denies chest pain, syncope, palpitations, headaches, vision or hearing changes, abd pain, nausea, vomiting, diarrhea, constipation, hematuria, hematochezia, melena, gum bleeding.  Please see problem based Assessment and Plan for status of patients chronic conditions.  Past Medical History:  Diagnosis Date  . Alcohol abuse   . Anxiety   . Asbestos exposure   . Avascular necrosis of bones of both hips (HCC)    Status post bilateral hip replacements  . Bipolar disorder ([place])   . Chronic systolic heart failure ([place])    a. NICM - EF 35% with normal cors 2003. b. Last echo 11/2012 - EF 25-30%.  . CKD (chronic kidney disease), stage III ([place])    Renal U/S 12/04/2009 showed no pathological findings. Labs 12/04/2009 include normal ESR, C3, C4; neg ANA; SPEP showed nonspecific increase in the alpha-2 region with no M-spike; UPEP showed no monoclonal free  light chains; urine IFE showed polyclonal increase in feree Kappa and/or free Lambda light chains. Baseline Cr reported 1.7-2.5.  [name] COPD (chronic obstructive pulmonary disease) ([place])   . Depression   . DVT (deep venous thrombosis) ([place])    He has hypercoagulability with multiple prior DVTs  . E coli bacteremia   . Erectile dysfunction   . Failure to thrive (0-17) 08/2016   DEHYDRATION  . Gastritis   . GERD (gastroesophageal reflux disease)   . HCAP (healthcare-associated pneumonia) 08/24/2015  . Hydrocele, unspecified   . Hyperlipemia   . Hypertension   . 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  Thyroid scan 08/28/2009 was normal with no focal areas of abnormal increased or decreased activity seen; the uptake of I 131 sodium iodide at 24 hours was 5.7%.  TSH and free T4 normalized by 08/16/2009.  . Intestinal obstruction ([place])   . Lung nodule    Chest CT scan on 12/14/2008 showed a nodular opacity at the left lung base felt to most likely represent scarring.  Follow-up chest CT scan on 06/02/2010 showed parenchymal scarring in the left apex, left  lower lobe, and lingula; some of this scarring at the left base had a nodular appearance, unchanged. Chest 09/2012 - stable.  [name] NICM (nonischemic cardiomyopathy) ([place])   . Noncompliance   . On home oxygen therapy    "3L periodically" (08/24/2015)  . Osteoarthritis   . Permanent atrial fibrillation ([place])    a. failed ablation in 2010 due to inability  to access/venous occlusions. b. failed multaq, amiodarone, tikosyn. c. has not been felt to be a good candidate for AVN ablation due to recurrent infections/bacteremia history.  . Pleural plaque    H/o asbestos exposure. Chest CT on 06/02/2010 showed stable extensive calcified pleural plaques involving the left hemithorax, consistent with asbestos related pleural disease.  . Portal vein thrombosis   . Protein calorie malnutrition ([place]) 04/2016  . Spondylosis     . STEMI (ST elevation myocardial infarction) ([place]) 02/23/1218   a. felt embolic in nature due to noncompliance with anticoagulation, LHC not pursued.  . Tachycardia-bradycardia syndrome [facility])    a. 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 b. [employer] gen change 2010.  [name] Thrombocytopenia ([place])   . 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  . Weight loss    Normal colonoscopy by Dr. Olevia Perches on 11/06/2010.    Review of Systems:   ROS Per HPI  Physical Exam:  Vitals:   06/19/17 1606  BP: 94/70  Pulse: (!) 53  Temp: 97.7 F (36.5 C)  TempSrc: Oral  SpO2: 100%  Weight: 131 lb (59.4 kg)  Height: _0  (1.905 m)   GENERAL- alert, co-operative, appears older than stated age, not in any distress, cachectic. HEENT- Atraumatic, normocephalic, EOMI, oral mucosa appears moist CARDIAC- irregularly irregular rhythm; noted to be tachy to ~150 for <28mn at rest w/o provocation and without symptoms then reverted back to 60-70s. RESP- Diminished breath sounds throughout. Moving equal volumes of air, and clear to auscultation bilaterally, no wheezes or crackles. ABDOMEN- Soft, nontender, bowel sounds present. NEURO- No obvious Cr N abnormality. EXTREMITIES- pulse 2+ radial, symmetric, no pedal edema. SKIN- Warm, dry.  Assessment & Plan:   See Encounters Tab for problem based charting.   Patient discussed with Dr. BCarmel Sacramento MD Internal Medicine PGY2

## 2017-06-19 NOTE — Patient Instructions (Addendum)
Please make an appointment to see Dr. Rayann Heman  [place] [address] [phone]   Increase your metoprolol to 50mg  twice daily.  We'll decrease your dose of eliquis to 2.5mg  twice daily.  Keep your appointment with Dr. [name]; we'd be happy to see you earlier if you start feeling more symptomatic.

## 2017-06-20 ENCOUNTER — Encounter: Payer: Self-pay | Admitting: Internal Medicine

## 2017-06-20 DIAGNOSIS — R42 Dizziness and giddiness: Secondary | ICD-10-CM | POA: Insufficient documentation

## 2017-06-20 NOTE — Assessment & Plan Note (Signed)
BP well controlled.  Orthostatics done in office negative on blood pressure alone.   Plan: --continue diltiazem 120mg  daily and metoprolol 50mg  up to BID as previously instructed.

## 2017-06-20 NOTE — Progress Notes (Signed)
Internal Medicine Clinic Attending  Case discussed with Dr. Svalina  at the time of the visit.  We reviewed the resident's history and exam and pertinent patient test results.  I agree with the assessment, diagnosis, and plan of care documented in the resident's note.  

## 2017-06-20 NOTE — Assessment & Plan Note (Signed)
Patient with chronic A fib with tachy-brady syndrome s/p pacemaker, failed ablation. Hospitalized with A fib w/ RVR due to not taking meds.  He reports compliance with diltiazem 120mg  daily, however is only taking metoprolol succinate 50mg  daily instead of BID. He presents with positional dizziness and shortness of breath; orthostatics done in office today show stable BP, however HR erratic between 60-80s and 130s; episodes of tachycardia to 150s also observed at rest on cardiac exam however at that time patient was asymptomatic.  His symptoms are likely from his tachy-brady syndrome. He was previously recommended to go to [facility] for possible other treatments and different approaches to ablation, however this has not occurred yet.   Also, during hospitalization, patient was restarted on eliquis at 5mg  BID due to at the time meeting criteria for full dosing; however looking historically, his Cr is more often than not >1.5 and weight bordering and below 60kgs.   Plan: --continue diltiazem 120mg  daily --restart metoprolol succinate 50mg  BID (patient only taking daily) --decrease eliquis to 2.5mg  BID --f/u with Dr. Rayann Heman and possible referral to [facility] for other options

## 2017-06-20 NOTE — Assessment & Plan Note (Signed)
Patient with orthostatic dizziness due to tachy-brady syndrome with A fib.   This has been occurring even during recent hospitalization based on chart review.  Patient has not been taking metoprolol adequately due to thinking that is causing his symptoms, however symptoms have persisted.  Plan: --continue diltiazem 120mg  daily --increase metoprolol to 50mg  BID as previously recommended --f/u with Dr. Rayann Heman

## 2017-06-30 ENCOUNTER — Other Ambulatory Visit: Payer: Self-pay

## 2017-06-30 DIAGNOSIS — Z79891 Long term (current) use of opiate analgesic: Secondary | ICD-10-CM

## 2017-06-30 MED ORDER — OXYCODONE-ACETAMINOPHEN 10-325 MG PO TABS: 1.0000 | ORAL_TABLET | ORAL | 0 refills | Status: DC | PRN

## 2017-06-30 MED ORDER — DILTIAZEM HCL ER COATED BEADS 120 MG PO CP24: 120.0000 mg | ORAL_CAPSULE | Freq: Every day | ORAL | 0 refills | Status: DC

## 2017-06-30 NOTE — Telephone Encounter (Signed)
Next appt scheduled  2/27 with PCP.  Last rx written for Percocet 05/30/17. Last OV 1/31 with Dr Jari Favre. Next OV 2/27. UDS 10/16/16.

## 2017-06-30 NOTE — Telephone Encounter (Signed)
diltiazem (CARDIZEM CD) 120 MG 24 hr capsule  oxyCODONE-acetaminophen (PERCOCET) 10-325 MG tablet, Refill request. Would like Helen to call back.

## 2017-07-04 ENCOUNTER — Other Ambulatory Visit: Payer: Self-pay | Admitting: *Deleted

## 2017-07-04 DIAGNOSIS — J441 Chronic obstructive pulmonary disease with (acute) exacerbation: Secondary | ICD-10-CM

## 2017-07-04 MED ORDER — ROSUVASTATIN CALCIUM 20 MG PO TABS: ORAL_TABLET | ORAL | 0 refills | Status: DC

## 2017-07-04 MED ORDER — TIOTROPIUM BROMIDE MONOHYDRATE 18 MCG IN CAPS: 18.0000 ug | ORAL_CAPSULE | Freq: Every day | RESPIRATORY_TRACT | 0 refills | Status: DC

## 2017-07-04 NOTE — Telephone Encounter (Signed)
Also requesting refill on Dronabinol - informed not on current med list but I will send request to his doctor.

## 2017-07-07 ENCOUNTER — Telehealth: Payer: Self-pay | Admitting: *Deleted

## 2017-07-07 NOTE — Telephone Encounter (Signed)
Pt has had N&V since last night, keeping nothing on his stomach Ask him to call 911 and come to ED He is hesitant, will call daughter and then call triage back, encouraged greatly to come to ED

## 2017-07-07 NOTE — Telephone Encounter (Signed)
I agree

## 2017-07-08 NOTE — Telephone Encounter (Signed)
Have tried to call, no answer

## 2017-07-11 NOTE — Telephone Encounter (Signed)
No answer

## 2017-07-14 ENCOUNTER — Other Ambulatory Visit: Payer: Self-pay

## 2017-07-14 ENCOUNTER — Inpatient Hospital Stay (HOSPITAL_COMMUNITY)
Admission: EM | Admit: 2017-07-14 | Discharge: 2017-07-18 | DRG: 190 | Disposition: A | Discharge status: Home Health | Payer: Medicare Other | Attending: Oncology | Admitting: Oncology

## 2017-07-14 ENCOUNTER — Encounter (HOSPITAL_COMMUNITY): Payer: Self-pay

## 2017-07-14 ENCOUNTER — Emergency Department (HOSPITAL_COMMUNITY): Payer: Medicare Other

## 2017-07-14 DIAGNOSIS — F319 Bipolar disorder, unspecified: Secondary | ICD-10-CM | POA: Diagnosis present

## 2017-07-14 DIAGNOSIS — Z993 Dependence on wheelchair: Secondary | ICD-10-CM

## 2017-07-14 DIAGNOSIS — J441 Chronic obstructive pulmonary disease with (acute) exacerbation: Principal | ICD-10-CM | POA: Diagnosis present

## 2017-07-14 DIAGNOSIS — F1010 Alcohol abuse, uncomplicated: Secondary | ICD-10-CM | POA: Diagnosis present

## 2017-07-14 DIAGNOSIS — Z950 Presence of cardiac pacemaker: Secondary | ICD-10-CM

## 2017-07-14 DIAGNOSIS — E43 Unspecified severe protein-calorie malnutrition: Secondary | ICD-10-CM | POA: Diagnosis present

## 2017-07-14 DIAGNOSIS — Z9114 Patient's other noncompliance with medication regimen: Secondary | ICD-10-CM

## 2017-07-14 DIAGNOSIS — F419 Anxiety disorder, unspecified: Secondary | ICD-10-CM | POA: Diagnosis present

## 2017-07-14 DIAGNOSIS — I252 Old myocardial infarction: Secondary | ICD-10-CM | POA: Diagnosis not present

## 2017-07-14 DIAGNOSIS — N183 Chronic kidney disease, stage 3 unspecified: Secondary | ICD-10-CM | POA: Diagnosis present

## 2017-07-14 DIAGNOSIS — K219 Gastro-esophageal reflux disease without esophagitis: Secondary | ICD-10-CM | POA: Diagnosis present

## 2017-07-14 DIAGNOSIS — Z86718 Personal history of other venous thrombosis and embolism: Secondary | ICD-10-CM

## 2017-07-14 DIAGNOSIS — I481 Persistent atrial fibrillation: Secondary | ICD-10-CM | POA: Diagnosis present

## 2017-07-14 DIAGNOSIS — Z66 Do not resuscitate: Secondary | ICD-10-CM

## 2017-07-14 DIAGNOSIS — N179 Acute kidney failure, unspecified: Secondary | ICD-10-CM | POA: Diagnosis present

## 2017-07-14 DIAGNOSIS — I739 Peripheral vascular disease, unspecified: Secondary | ICD-10-CM | POA: Diagnosis present

## 2017-07-14 DIAGNOSIS — E46 Unspecified protein-calorie malnutrition: Secondary | ICD-10-CM | POA: Diagnosis not present

## 2017-07-14 DIAGNOSIS — L8990 Pressure ulcer of unspecified site, unspecified stage: Secondary | ICD-10-CM | POA: Diagnosis present

## 2017-07-14 DIAGNOSIS — R911 Solitary pulmonary nodule: Secondary | ICD-10-CM | POA: Diagnosis present

## 2017-07-14 DIAGNOSIS — Z9119 Patient's noncompliance with other medical treatment and regimen: Secondary | ICD-10-CM

## 2017-07-14 DIAGNOSIS — Z681 Body mass index (BMI) 19 or less, adult: Secondary | ICD-10-CM

## 2017-07-14 DIAGNOSIS — Z9981 Dependence on supplemental oxygen: Secondary | ICD-10-CM

## 2017-07-14 DIAGNOSIS — F1290 Cannabis use, unspecified, uncomplicated: Secondary | ICD-10-CM | POA: Diagnosis present

## 2017-07-14 DIAGNOSIS — Z515 Encounter for palliative care: Secondary | ICD-10-CM

## 2017-07-14 DIAGNOSIS — R42 Dizziness and giddiness: Secondary | ICD-10-CM

## 2017-07-14 DIAGNOSIS — I480 Paroxysmal atrial fibrillation: Secondary | ICD-10-CM | POA: Diagnosis not present

## 2017-07-14 DIAGNOSIS — Z880 Allergy status to penicillin: Secondary | ICD-10-CM

## 2017-07-14 DIAGNOSIS — Z79891 Long term (current) use of opiate analgesic: Secondary | ICD-10-CM

## 2017-07-14 DIAGNOSIS — E860 Dehydration: Secondary | ICD-10-CM | POA: Diagnosis present

## 2017-07-14 DIAGNOSIS — Z79899 Other long term (current) drug therapy: Secondary | ICD-10-CM | POA: Diagnosis not present

## 2017-07-14 DIAGNOSIS — B349 Viral infection, unspecified: Secondary | ICD-10-CM | POA: Diagnosis present

## 2017-07-14 DIAGNOSIS — I4811 Longstanding persistent atrial fibrillation: Secondary | ICD-10-CM | POA: Diagnosis present

## 2017-07-14 DIAGNOSIS — I700 Atherosclerosis of aorta: Secondary | ICD-10-CM | POA: Diagnosis present

## 2017-07-14 DIAGNOSIS — R64 Cachexia: Secondary | ICD-10-CM | POA: Diagnosis present

## 2017-07-14 DIAGNOSIS — E785 Hyperlipidemia, unspecified: Secondary | ICD-10-CM | POA: Diagnosis present

## 2017-07-14 DIAGNOSIS — F17210 Nicotine dependence, cigarettes, uncomplicated: Secondary | ICD-10-CM | POA: Diagnosis present

## 2017-07-14 DIAGNOSIS — I5042 Chronic combined systolic (congestive) and diastolic (congestive) heart failure: Secondary | ICD-10-CM | POA: Diagnosis present

## 2017-07-14 DIAGNOSIS — R627 Adult failure to thrive: Secondary | ICD-10-CM | POA: Diagnosis present

## 2017-07-14 DIAGNOSIS — Z7901 Long term (current) use of anticoagulants: Secondary | ICD-10-CM | POA: Diagnosis not present

## 2017-07-14 DIAGNOSIS — Z8249 Family history of ischemic heart disease and other diseases of the circulatory system: Secondary | ICD-10-CM

## 2017-07-14 DIAGNOSIS — Z7982 Long term (current) use of aspirin: Secondary | ICD-10-CM

## 2017-07-14 DIAGNOSIS — I5040 Unspecified combined systolic (congestive) and diastolic (congestive) heart failure: Secondary | ICD-10-CM | POA: Diagnosis not present

## 2017-07-14 DIAGNOSIS — I428 Other cardiomyopathies: Secondary | ICD-10-CM | POA: Diagnosis present

## 2017-07-14 DIAGNOSIS — I482 Chronic atrial fibrillation: Secondary | ICD-10-CM | POA: Diagnosis not present

## 2017-07-14 DIAGNOSIS — I130 Hypertensive heart and chronic kidney disease with heart failure and stage 1 through stage 4 chronic kidney disease, or unspecified chronic kidney disease: Secondary | ICD-10-CM | POA: Diagnosis present

## 2017-07-14 DIAGNOSIS — Z77090 Contact with and (suspected) exposure to asbestos: Secondary | ICD-10-CM | POA: Diagnosis present

## 2017-07-14 DIAGNOSIS — I9589 Other hypotension: Secondary | ICD-10-CM | POA: Diagnosis not present

## 2017-07-14 DIAGNOSIS — Z96643 Presence of artificial hip joint, bilateral: Secondary | ICD-10-CM | POA: Diagnosis present

## 2017-07-14 DIAGNOSIS — M6284 Sarcopenia: Secondary | ICD-10-CM | POA: Diagnosis not present

## 2017-07-14 DIAGNOSIS — Z888 Allergy status to other drugs, medicaments and biological substances status: Secondary | ICD-10-CM

## 2017-07-14 LAB — COMPREHENSIVE METABOLIC PANEL
ALBUMIN: 3.6 g/dL (ref 3.5–5.0)
ALT: 11 U/L — AB (ref 17–63)
AST: 28 U/L (ref 15–41)
Alkaline Phosphatase: 60 U/L (ref 38–126)
Anion gap: 12 (ref 5–15)
BILIRUBIN TOTAL: 0.9 mg/dL (ref 0.3–1.2)
BUN: 42 mg/dL — AB (ref 6–20)
CHLORIDE: 108 mmol/L (ref 101–111)
CO2: 20 mmol/L — ABNORMAL LOW (ref 22–32)
CREATININE: 2.06 mg/dL — AB (ref 0.61–1.24)
Calcium: 9.5 mg/dL (ref 8.9–10.3)
GFR calc Af Amer: 35 mL/min — ABNORMAL LOW (ref >60)
GFR, EST NON AFRICAN AMERICAN: 30 mL/min — AB (ref >60)
GLUCOSE: 111 mg/dL — AB (ref 65–99)
Potassium: 5 mmol/L (ref 3.5–5.1)
Sodium: 140 mmol/L (ref 135–145)
Total Protein: 6.4 g/dL — ABNORMAL LOW (ref 6.5–8.1)

## 2017-07-14 LAB — CBC
HCT: 47.8 % (ref 39.0–52.0)
HEMOGLOBIN: 16 g/dL (ref 13.0–17.0)
MCH: 31 pg (ref 26.0–34.0)
MCHC: 33.5 g/dL (ref 30.0–36.0)
MCV: 92.6 fL (ref 78.0–100.0)
Platelets: 132 10*3/uL — ABNORMAL LOW (ref 150–400)
RBC: 5.16 MIL/uL (ref 4.22–5.81)
RDW: 14.7 % (ref 11.5–15.5)
WBC: 13.8 10*3/uL — ABNORMAL HIGH (ref 4.0–10.5)

## 2017-07-14 LAB — MAGNESIUM: Magnesium: 2.1 mg/dL (ref 1.7–2.4)

## 2017-07-14 LAB — BRAIN NATRIURETIC PEPTIDE: B Natriuretic Peptide: 185.2 pg/mL — ABNORMAL HIGH (ref 0.0–100.0)

## 2017-07-14 LAB — INFLUENZA PANEL BY PCR (TYPE A & B)
Influenza A By PCR: NEGATIVE
Influenza B By PCR: NEGATIVE

## 2017-07-14 LAB — LIPASE, BLOOD: LIPASE: 29 U/L (ref 11–51)

## 2017-07-14 LAB — I-STAT TROPONIN, ED
Troponin i, poc: 0.02 ng/mL (ref 0.00–0.08)
Troponin i, poc: 0.03 ng/mL (ref 0.00–0.08)

## 2017-07-14 MED ORDER — SODIUM CHLORIDE 0.9 % IV BOLUS (SEPSIS)
1000.0000 mL | Freq: Once | INTRAVENOUS | Status: AC
  Administered 2017-07-14: 1000 mL via INTRAVENOUS

## 2017-07-14 MED ORDER — METOPROLOL TARTRATE 5 MG/5ML IV SOLN
2.5000 mg | INTRAVENOUS | Status: DC | PRN
  Administered 2017-07-14 – 2017-07-15 (×2): 2.5 mg via INTRAVENOUS
  Filled 2017-07-14 (×2): qty 5

## 2017-07-14 MED ORDER — OXYCODONE-ACETAMINOPHEN 5-325 MG PO TABS
1.0000 | ORAL_TABLET | ORAL | Status: DC | PRN
  Administered 2017-07-14 – 2017-07-18 (×11): 1 via ORAL
  Filled 2017-07-14 (×11): qty 1

## 2017-07-14 MED ORDER — BUPROPION HCL ER (SR) 100 MG PO TB12
100.0000 mg | ORAL_TABLET | Freq: Two times a day (BID) | ORAL | Status: DC
  Administered 2017-07-14 – 2017-07-18 (×8): 100 mg via ORAL
  Filled 2017-07-14 (×9): qty 1

## 2017-07-14 MED ORDER — TIOTROPIUM BROMIDE MONOHYDRATE 18 MCG IN CAPS
18.0000 ug | ORAL_CAPSULE | Freq: Every day | RESPIRATORY_TRACT | Status: DC
  Administered 2017-07-15 – 2017-07-18 (×4): 18 ug via RESPIRATORY_TRACT
  Filled 2017-07-14: qty 5

## 2017-07-14 MED ORDER — DEXTROSE 5 % IV SOLN
250.0000 mg | INTRAVENOUS | Status: DC
Start: 2017-07-15 — End: 2017-07-18
  Administered 2017-07-15 – 2017-07-17 (×3): 250 mg via INTRAVENOUS
  Filled 2017-07-14 (×7): qty 250

## 2017-07-14 MED ORDER — ASPIRIN 81 MG PO CHEW
81.0000 mg | CHEWABLE_TABLET | Freq: Every day | ORAL | Status: DC
  Administered 2017-07-15 – 2017-07-18 (×4): 81 mg via ORAL
  Filled 2017-07-14 (×4): qty 1

## 2017-07-14 MED ORDER — ASPIRIN 81 MG PO CHEW: 324.0000 mg | CHEWABLE_TABLET | Freq: Once | ORAL | Status: DC

## 2017-07-14 MED ORDER — ENSURE ENLIVE PO LIQD: 237.0000 mL | Freq: Two times a day (BID) | ORAL | Status: DC

## 2017-07-14 MED ORDER — OXYCODONE HCL 5 MG PO TABS
5.0000 mg | ORAL_TABLET | ORAL | Status: DC | PRN
  Administered 2017-07-15 – 2017-07-18 (×9): 5 mg via ORAL
  Filled 2017-07-14 (×9): qty 1

## 2017-07-14 MED ORDER — APIXABAN 2.5 MG PO TABS
2.5000 mg | ORAL_TABLET | Freq: Two times a day (BID) | ORAL | Status: DC
  Administered 2017-07-14 – 2017-07-18 (×8): 2.5 mg via ORAL
  Filled 2017-07-14 (×8): qty 1

## 2017-07-14 MED ORDER — METOPROLOL SUCCINATE ER 50 MG PO TB24
50.0000 mg | ORAL_TABLET | Freq: Two times a day (BID) | ORAL | Status: DC
  Administered 2017-07-14 – 2017-07-18 (×6): 50 mg via ORAL
  Filled 2017-07-14 (×7): qty 1

## 2017-07-14 MED ORDER — BOOST / RESOURCE BREEZE PO LIQD CUSTOM
1.0000 | Freq: Three times a day (TID) | ORAL | Status: DC
  Administered 2017-07-14 – 2017-07-15 (×3): 237 mL via ORAL
  Administered 2017-07-16 – 2017-07-18 (×6): 1 via ORAL
  Filled 2017-07-14 (×2): qty 1
  Filled 2017-07-14 (×3): qty 237
  Filled 2017-07-14: qty 1
  Filled 2017-07-14 (×3): qty 237

## 2017-07-14 MED ORDER — PREDNISONE 20 MG PO TABS
40.0000 mg | ORAL_TABLET | Freq: Every day | ORAL | Status: AC
  Administered 2017-07-15 – 2017-07-18 (×4): 40 mg via ORAL
  Filled 2017-07-14 (×3): qty 2
  Filled 2017-07-14: qty 4

## 2017-07-14 MED ORDER — IPRATROPIUM-ALBUTEROL 0.5-2.5 (3) MG/3ML IN SOLN
3.0000 mL | Freq: Once | RESPIRATORY_TRACT | Status: AC
Start: 2017-07-14 — End: 2017-07-14
  Administered 2017-07-14: 3 mL via RESPIRATORY_TRACT
  Filled 2017-07-14: qty 3

## 2017-07-14 MED ORDER — DRONABINOL 2.5 MG PO CAPS
10.0000 mg | ORAL_CAPSULE | Freq: Two times a day (BID) | ORAL | Status: DC
  Administered 2017-07-15 – 2017-07-18 (×7): 10 mg via ORAL
  Filled 2017-07-14 (×7): qty 4

## 2017-07-14 MED ORDER — METHYLPREDNISOLONE SODIUM SUCC 125 MG IJ SOLR
125.0000 mg | Freq: Once | INTRAMUSCULAR | Status: AC
  Administered 2017-07-14: 125 mg via INTRAVENOUS
  Filled 2017-07-14: qty 2

## 2017-07-14 MED ORDER — DILTIAZEM HCL ER COATED BEADS 120 MG PO CP24
120.0000 mg | ORAL_CAPSULE | Freq: Every day | ORAL | Status: DC
  Administered 2017-07-14 – 2017-07-18 (×4): 120 mg via ORAL
  Filled 2017-07-14 (×4): qty 1

## 2017-07-14 MED ORDER — SODIUM CHLORIDE 0.9 % IV SOLN
INTRAVENOUS | Status: AC
  Administered 2017-07-14 – 2017-07-15 (×2): via INTRAVENOUS

## 2017-07-14 MED ORDER — OXYCODONE-ACETAMINOPHEN 10-325 MG PO TABS
1.0000 | ORAL_TABLET | ORAL | Status: DC | PRN
Start: 2017-07-14 — End: 2017-07-14

## 2017-07-14 MED ORDER — TIOTROPIUM BROMIDE MONOHYDRATE 18 MCG IN CAPS
18.0000 ug | ORAL_CAPSULE | Freq: Every day | RESPIRATORY_TRACT | Status: DC
  Filled 2017-07-14: qty 5

## 2017-07-14 MED ORDER — ROSUVASTATIN CALCIUM 20 MG PO TABS
20.0000 mg | ORAL_TABLET | Freq: Every day | ORAL | Status: DC
  Administered 2017-07-14 – 2017-07-17 (×4): 20 mg via ORAL
  Filled 2017-07-14: qty 2
  Filled 2017-07-14: qty 1
  Filled 2017-07-14: qty 2
  Filled 2017-07-14 (×4): qty 1
  Filled 2017-07-14: qty 2

## 2017-07-14 MED ORDER — IPRATROPIUM-ALBUTEROL 0.5-2.5 (3) MG/3ML IN SOLN: 3.0000 mL | Freq: Four times a day (QID) | RESPIRATORY_TRACT | Status: DC | PRN

## 2017-07-14 NOTE — ED Notes (Signed)
Report attempted 

## 2017-07-14 NOTE — ED Provider Notes (Signed)
[facility] EMERGENCY DEPARTMENT Provider Note   CSN: [id number] Arrival date & time: 07/14/17  1215     History   Chief Complaint Chief Complaint  Patient presents with  . Chest Pain    HPI [name] is a 72 y.o. male a history of atrial fibrillation on metoprolol, cardizem and eloquis, heart failure with EF 30%, COPD on chronic oxygen therapy, tachy-bradycardia syndrome with pacemaker, renal insufficiency is here for evaluation of pains in his chest for one week described as sharp starting on the right side of his chest moving to the left side worse with movement, coughing and taking deep breaths. Also reports productive cough for one week with increased sputum production, he has cough attacks that make him gag but has not vomited. Reports watery, nonbloody BMs 2-3 times a day for the last week. He feels dehydrated. States he has not eaten in 2-3 days. Reports compliance with all his medications but did not take any this morning PTA. Uses albuterol and Spiriva at home as prescribed. Uses 3 L nasal cannula when necessary at home without any new requirements. He is wheelchair-bound. He denies fevers, new oxygen requirements, abdominal pain, melena, hematochezia. States occasionally he has burning with urination that shoots up into his head and makes him dizzy, ongoing for a "long time". Admitted for COPD exacerbation on 05/22/2017. Family member was coughing and had sore throat over the weekend.   HPI  Past Medical History:  Diagnosis Date  . Alcohol abuse   . Anxiety   . Asbestos exposure   . Avascular necrosis of bones of both hips (HCC)    Status post bilateral hip replacements  . Bipolar disorder ([place])   . Chronic systolic heart failure ([place])    a. NICM - EF 35% with normal cors 2003. b. Last echo 11/2012 - EF 25-30%.  . CKD (chronic kidney disease), stage III ([place])    Renal U/S 12/04/2009 showed no pathological findings. Labs 12/04/2009 include normal ESR, C3,  C4; neg ANA; SPEP showed nonspecific increase in the alpha-2 region with no M-spike; UPEP showed no monoclonal free light chains; urine IFE showed polyclonal increase in feree Kappa and/or free Lambda light chains. Baseline Cr reported 1.7-2.5.  [name] COPD (chronic obstructive pulmonary disease) ([place])   . Depression   . DVT (deep venous thrombosis) ([place])    He has hypercoagulability with multiple prior DVTs  . E coli bacteremia   . Erectile dysfunction   . Failure to thrive (0-17) 08/2016   DEHYDRATION  . Gastritis   . GERD (gastroesophageal reflux disease)   . HCAP (healthcare-associated pneumonia) 08/24/2015  . Hydrocele, unspecified   . Hyperlipemia   . Hypertension   . 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  Thyroid scan 08/28/2009 was normal with no focal areas of abnormal increased or decreased activity seen; the uptake of I 131 sodium iodide at 24 hours was 5.7%.  TSH and free T4 normalized by 08/16/2009.  . Intestinal obstruction ([place])   . Lung nodule    Chest CT scan on 12/14/2008 showed a nodular opacity at the left lung base felt to most likely represent scarring.  Follow-up chest CT scan on 06/02/2010 showed parenchymal scarring in the left apex, left  lower lobe, and lingula; some of this scarring at the left base had a nodular appearance, unchanged. Chest 09/2012 - stable.  [name] NICM (nonischemic cardiomyopathy) ([place])   . Noncompliance   . On home  oxygen therapy    "3L periodically" (08/24/2015)  . Osteoarthritis   . Permanent atrial fibrillation ([place])    a. failed ablation in 2010 due to inability to access/venous occlusions. b. failed multaq, amiodarone, tikosyn. c. has not been felt to be a good candidate for AVN ablation due to recurrent infections/bacteremia history.  . Pleural plaque    H/o asbestos exposure. Chest CT on 06/02/2010 showed stable extensive calcified pleural plaques involving the left hemithorax, consistent with asbestos  related pleural disease.  . Portal vein thrombosis   . Protein calorie malnutrition ([place]) 04/2016  . Spondylosis   . STEMI (ST elevation myocardial infarction) ([place]) 43/32/9518   a. felt embolic in nature due to noncompliance with anticoagulation, LHC not pursued.  . Tachycardia-bradycardia syndrome [facility])    a. 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 b. St. Jude gen change 2010.  [name] Thrombocytopenia ([place])   . 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  . Weight loss    Normal colonoscopy by Dr. Olevia Perches on 11/06/2010.    Patient Active Problem List   Diagnosis Date Noted  . Orthostatic dizziness 06/20/2017  . Chronic respiratory failure with hypoxia ([place]) 05/22/2017  . History of MI (myocardial infarction)   . History of DVT (deep vein thrombosis)   . 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 Thrombocytopenia ([place]) 09/10/2016  . Abdominal aortic atherosclerosis ([place]) 09/05/2016  . Protein-calorie malnutrition, severe 02/18/2016  . Chronic hydrocele   . Asbestosis ([place])   . CKD (chronic kidney disease) stage 3, GFR 30-59 ml/min (HCC)   . Coagulopathy ([place])   . Long term current use of opiate analgesic 06/27/2015  . Neuropathic pain of both feet 05/03/2015  . Longstanding persistent atrial fibrillation ([place]) 10/11/2014  . Healthcare maintenance 01/25/2014  . BPH (benign prostatic hyperplasia) 01/20/2014  . Atypical atrial flutter ([place]) 01/17/2014  . PVD (peripheral vascular disease) ([place]) 12/10/2013  . Chronic combined systolic and diastolic congestive heart failure ([place]) 06/20/2010  . H/O Arterial embolism of leg  06/05/2010  . Nonischemic cardiomyopathy ([place]) 06/01/2010  . Major depressive disorder, recurrent episode ([place]) 01/19/2010  . History of hyperthyroidism 03/13/2009  . Bilateral hip pain 03/23/2007  . OSTEOARTHRITIS 09/05/2006  . COPD with acute  exacerbation ([place]) 08/28/2006  . History of tobacco abuse 03/01/2006  . Essential hypertension 03/01/2006  . Cardiac pacemaker in situ 03/01/2006    Past Surgical History:  Procedure Laterality Date  . CARDIAC CATHETERIZATION  2003   Nl cors, EF 35%  . CARDIOVERSION N/A 05/27/2013   Procedure: CARDIOVERSION;  Surgeon: Candee Furbish, MD;  Location[facility] ENDOSCOPY;  Service: Cardiovascular;  Laterality: N/A;  . EMBOLECTOMY Right 12/10/2013   Procedure: Right Femoral Embolectomy; Fasciotomy Right Lower Leg with Intraoperative arteriogram;  Surgeon: Rosetta Posner, MD;  Location: [facility];  Service: Vascular;  Laterality: Right;  . EMBOLECTOMY Left 10/12/2014   Procedure: Left Femoral Thrombectomy with intraoperative arteriogram;  Surgeon: Rosetta Posner, MD;  Location: [facility];  Service: Vascular;  Laterality: Left;  . FASCIOTOMY CLOSURE Right 12/15/2013   Procedure: LEG FASCIOTOMY CLOSURE;  Surgeon: Rosetta Posner, MD;  Location: [facility];  Service: Vascular;  Laterality: Right;  . HEMIARTHROPLASTY HIP Left 09/09/2002   bipolar; Dr. Lafayette Dragon  . INSERT / REPLACE / REMOVE PACEMAKER  02/2003   Dr. Roe Rutherford, [employer] Medical pulse generator identity SR model [id number] serial [id number]; gen change  by Lesle Chris / REPLACE / REMOVE PACEMAKER  06/17/2008   Lead and generator change w/ St. [employer] Medical Tendril ST model [id number] (serial  [id number]).       . INTRAOPERATIVE ARTERIOGRAM Right 12/10/2013   Procedure: INTRA OPERATIVE ARTERIOGRAM;  Surgeon: Rosetta Posner, MD;  Location: [facility];  Service: Vascular;  Laterality: Right;  . JOINT REPLACEMENT    . TEE WITHOUT CARDIOVERSION N/A 05/27/2013   Procedure: TRANSESOPHAGEAL ECHOCARDIOGRAM (TEE);  Surgeon: Candee Furbish, MD;  Location[facility] ENDOSCOPY;  Service: Cardiovascular;  Laterality: N/A;  . TOTAL HIP ARTHROPLASTY Right 03/08/2008    By Dr. Hiram Comber       Home Medications    Prior to Admission medications   Medication Sig Start Date End Date Taking?  Authorizing Provider  albuterol (VENTOLIN HFA) 108 (90 Base) MCG/ACT inhaler Inhale 2 puffs into the lungs every 6 (six) hours as needed for wheezing or shortness of breath. 05/29/17  Yes Zada Finders, MD  apixaban (ELIQUIS) 2.5 MG TABS tablet Take 1 tablet (2.5 mg total) by mouth 2 (two) times daily. 06/19/17  Yes Alphonzo Grieve, MD  aspirin 81 MG chewable tablet Chew 1 tablet (81 mg total) by mouth daily. 02/12/17  Yes Zada Finders, MD  buPROPion [facility] SR) 100 MG 12 hr tablet Take 1 tablet (100 mg total) by mouth 2 (two) times daily. 05/27/17  Yes Rosita Fire, MD  diltiazem (CARDIZEM CD) 120 MG 24 hr capsule Take 1 capsule (120 mg total) by mouth daily. 06/30/17  Yes Zada Finders, MD  dronabinol (MARINOL) 10 MG capsule Take 10 mg by mouth 2 (two) times daily before a meal.   Yes [provider]  feeding supplement (BOOST / RESOURCE BREEZE) LIQD Take 1 Container by mouth 3 (three) times daily between meals. 09/09/16  Yes Burgess Estelle, MD  metoprolol succinate (TOPROL-XL) 50 MG 24 hr tablet Take 1 tablet (50 mg total) by mouth every 12 (twelve) hours. Take with or immediately following a meal. 06/19/17  Yes Alphonzo Grieve, MD  oxyCODONE-acetaminophen (PERCOCET) 10-325 MG tablet Take 1 tablet by mouth every 4 (four) hours as needed for pain. Do not take more than 5 tablets in 24 hours. 06/30/17 06/29/18 Yes Zada Finders, MD  rosuvastatin (CRESTOR) 20 MG tablet TAKE 1 TABLET EVERY DAY AT 6 PM 07/04/17  Yes Zada Finders, MD  senna-docusate (SENOKOT-S) 8.6-50 MG tablet Take 2 tablets by mouth 2 (two) times daily. Patient taking differently: Take 1 tablet by mouth at bedtime.  02/12/17  Yes Zada Finders, MD  tiotropium (SPIRIVA) 18 MCG inhalation capsule Place 1 capsule (18 mcg total) into inhaler and inhale daily. 07/04/17  Yes Zada Finders, MD  polyethylene glycol (MIRALAX / GLYCOLAX) packet Take 17 g by mouth daily as needed. Patient not taking: Reported on 05/22/2017 02/12/17    Zada Finders, MD    Family History Family History  Problem Relation Age of Onset  . Hypertension Mother   . Asthma Mother   . Coronary artery disease Mother   . Cancer Mother   . Stroke Mother   . Cancer Brother        Unsure of type.  [name] Heart disease Brother   . Heart attack Brother   . Heart attack Sister   . Colon cancer Neg Hx   . Lung cancer Neg Hx   . Prostate cancer Neg Hx     Social History Social History   Tobacco Use  . Smoking status: Current Some  Day Smoker    Packs/day: 0.25    Years: 54.00    Pack years: 13.50    Types: Cigarettes    Start date: 10  . Smokeless tobacco: Current User  . Tobacco comment: cutting back  Substance Use Topics  . Alcohol use: No    Alcohol/week: 0.0 oz    Comment: History of ETOH Abuse."  . Drug use: Yes    Frequency: 1.0 times per week    Types: Marijuana    Comment: Every other day.      Allergies   Amiodarone and Penicillins   Review of Systems Review of Systems  Respiratory: Positive for cough and chest tightness.   Cardiovascular: Positive for chest pain.  Gastrointestinal: Positive for diarrhea.  Genitourinary: Positive for difficulty urinating.  Hematological: Bruises/bleeds easily.  All other systems reviewed and are negative.    Physical Exam Updated Vital Signs BP 113/89   Pulse (!) 42   Temp 98.1 F (36.7 C) (Oral)   Resp 20   Ht '6\' 3"'  (1.905 m)   Wt 59.4 kg (131 lb)   SpO2 96%   BMI 16.37 kg/m   Physical Exam  Constitutional: He is oriented to person, place, and time. He appears well-developed and well-nourished. No distress.  Non toxic.  HENT:  Head: Normocephalic and atraumatic.  Nose: Nose normal.  Mouth/Throat: Mucous membranes are dry. Posterior oropharyngeal erythema present.  Nasal mucosa erythematous. No trismus. No asymmetric tonsillar hypertrophy.   Eyes: Conjunctivae and EOM are normal. Pupils are equal, round, and reactive to light.  Neck: Normal range of motion.    Cardiovascular: Normal rate, regular rhythm, normal heart sounds and intact distal pulses.  No murmur heard. 2+ DP and radial pulses bilaterally. No LE edema or calf tenderness.   Pulmonary/Chest: Effort normal. He has decreased breath sounds.  Diminished breath sounds throughout, slightly worse in LLL.   Abdominal: Soft. Bowel sounds are normal. There is tenderness in the suprapubic area.  No G/R/R. No CVA tenderness.   Musculoskeletal: Normal range of motion. He exhibits no deformity.  Able to life legs off bed 1-2 inches for a few seconds. Wheel chair bound. Sits up on bed without difficulty.   Neurological: He is alert and oriented to person, place, and time.  Skin: Skin is warm and dry. Capillary refill takes less than 2 seconds.  Psychiatric: He has a normal mood and affect. His behavior is normal. Judgment and thought content normal.  Nursing note and vitals reviewed.    ED Treatments / Results  Labs (all labs ordered are listed, but only abnormal results are displayed) Labs Reviewed  CBC - Abnormal; Notable for the following components:      Result Value   WBC 13.8 (*)    Platelets 132 (*)    All other components within normal limits  BRAIN NATRIURETIC PEPTIDE - Abnormal; Notable for the following components:   B Natriuretic Peptide 185.2 (*)    All other components within normal limits  COMPREHENSIVE METABOLIC PANEL - Abnormal; Notable for the following components:   CO2 20 (*)    Glucose, Bld 111 (*)    BUN 42 (*)    Creatinine, Ser 2.06 (*)    Total Protein 6.4 (*)    ALT 11 (*)    GFR calc non Af Amer 30 (*)    GFR calc Af Amer 35 (*)    All other components within normal limits  URINE CULTURE  MAGNESIUM  LIPASE, BLOOD  URINALYSIS,  ROUTINE W REFLEX MICROSCOPIC  INFLUENZA PANEL BY PCR (TYPE A & B)  CBG MONITORING, ED  I-STAT TROPONIN, ED  I-STAT TROPONIN, ED    EKG  EKG Interpretation  Date/Time:  Monday July 14 2017 12:36:01 EST Ventricular Rate:   119 PR Interval:    QRS Duration: 106 QT Interval:  295 QTC Calculation: 401 R Axis:   30 Text Interpretation:  Atrial fibrillation Inferior infarct, old No significant change was found Confirmed by Jola Schmidt [phone]) on 07/14/2017 1:51:30 PM       Radiology Dg Chest 2 View  Result Date: 07/14/2017 CLINICAL DATA:  Chest pain, shortness of breath. EXAM: CHEST  2 VIEW COMPARISON:  Radiographs of May 22, 2017. FINDINGS: Stable cardiomediastinal silhouette. Atherosclerosis of thoracic aorta is noted. Right-sided pacemaker is unchanged in position. Right lung is clear. Stable calcified pleural plaques are seen in left hemithorax. No pneumothorax is noted. Stable left basilar scarring is noted. No acute abnormality is noted. Bony thorax is unremarkable. IMPRESSION: No active cardiopulmonary disease. Stable calcified pleural plaques in left hemithorax consistent with asbestos exposure. Aortic Atherosclerosis (ICD10-I70.0). Electronically Signed   By: Marijo Conception, M.D.   On: 07/14/2017 15:15    Procedures Procedures (including critical care time)  Medications Ordered in ED Medications  ipratropium-albuterol (DUONEB) 0.5-2.5 (3) MG/3ML nebulizer solution 3 mL (3 mLs Nebulization Given 07/14/17 1423)  sodium chloride 0.9 % bolus 1,000 mL (1,000 mLs Intravenous New Bag/Given 07/14/17 1417)  methylPREDNISolone sodium succinate (SOLU-MEDROL) 125 mg/2 mL injection 125 mg (125 mg Intravenous Given 07/14/17 1524)     Initial Impression / Assessment and Plan / ED Course  I have reviewed the triage vital signs and the nursing notes.  Pertinent labs & imaging results that were available during my care of the patient were reviewed by me and considered in my medical decision making (see chart for details).  Clinical Course as of Jul 14 1545  Mon Jul 14, 2017  1508 Creatinine: (!) 2.06 [CG]  1508 GFR, Est African American: (!) 35 [CG]  1508 B Natriuretic Peptide: (!) 185.2 [CG]  1508 WBC: (!)  13.8 [CG]  1529 HR 115-130. Pt requesting imodium for diarrhea.   [CG]  3086 Basic metabolic panel [CG]    Clinical Course User Index [CG] Kinnie Feil, PA-C   BNP mildly elevated at 185.2 but at baseline.    Creatinine 2.06 elevated from his baseline. He looks dehydrated. Slight leukocytosis WBC 13.8.  Chest x-ray without infiltrate and shows chronic asbestos exposure changes. U/A pending.  He has not taken any of his medications today including metoprolol and cardizem. On the monitor his heart rate is fluctuating between 110-130 likely due to dehydration and medical noncompliance.    Final Clinical Impressions(s) / ED Diagnoses   Patient given limited IV fluids, DuoNeb, methylprednisone for COPD exacerbation likely from viral illness.  AKI due to dehydration. He does have suprapubic tenderness with dysuria and diarrhea. May be underlying GI process like diverticulitis, colitis contributing although viral illness higher in differential.. His creatinine/BUN limits imaging. Will request admission for gentle hydration, COPD exacerbation management and HR control.  Final diagnoses:  Acute kidney injury [facility])  COPD exacerbation [facility])    ED Discharge Orders    None       Arlean Hopping 07/14/17 1547    Jola Schmidt, MD 07/14/17 [phone]

## 2017-07-14 NOTE — ED Notes (Signed)
Pt stated he had feces that needed to be cleaned up. Pt had tissue type paper around his rectal area that had what looked like old feces on it. Cleaned Pt and checked a rectal temp. Pt assisted with rolling.

## 2017-07-14 NOTE — H&P (Signed)
Date: 07/14/2017               Patient Name:  [name] MRN: [id number]  DOB: Apr 02, 1946 Age / Sex: 72 y.o., male   PCP: Zada Finders, MD         Medical Service: Internal Medicine Teaching Service         Attending Physician: Dr. Rebeca Alert Raynaldo Opitz, MD    First Contact: Dr. Johny Chess Pager: [phone]  Second Contact: Dr. Philipp Ovens Pager: [phone]       After Hours (After 5p/  First Contact Pager: [phone]  weekends / holidays): Second Contact Pager: [phone]   Chief Complaint: Shortness of breath   History of Present Illness: [name] is a 72 yo M with a past medical history of COPD (on 3 L home O2 prn), a fib, HF, HTN, CKD III, tachy-brady s/p pacemaker, PVD, depression who presented to the ED with complaints of SOB, diarrhea, cough.   He reports about a week long history of multiple symptoms including diarrhea, cough productive of thick and yellow sputum, shortness of breath, decreased appetite, and fatigue. He has intermittent chest pain which is sharp and occurs with coughing. He notes subjective fever but had not taken his temperature. He has had decreased PO intake over the course of his illness with his last full meal being 3 days ago, has had little fluid or other intake since that time. He has had two BMs this morning, three yesterday--watery and soft with no blood. He has a grandson who was recently sick with sore throat, ear ache.     In the ED, T 98.1 F, HR 109, BP 104/76, 100% on 4 L [name]. Labs notable for Cr 2.06, WBC 13.8, troponin negative. He received a breathing treatment, dose of IV steroids, 1 L IVF bolus and was admitted for further management.   Meds:  Current Meds  Medication Sig  . albuterol (VENTOLIN HFA) 108 (90 Base) MCG/ACT inhaler Inhale 2 puffs into the lungs every 6 (six) hours as needed for wheezing or shortness of breath.  [name] apixaban (ELIQUIS) 2.5 MG TABS tablet Take 1 tablet (2.5 mg total) by mouth 2 (two) times daily.  [name] aspirin 81 MG chewable  tablet Chew 1 tablet (81 mg total) by mouth daily.  [name] buPROPion (WELLBUTRIN SR) 100 MG 12 hr tablet Take 1 tablet (100 mg total) by mouth 2 (two) times daily.  [name] diltiazem (CARDIZEM CD) 120 MG 24 hr capsule Take 1 capsule (120 mg total) by mouth daily.  [name] dronabinol (MARINOL) 10 MG capsule Take 10 mg by mouth 2 (two) times daily before a meal.  . feeding supplement (BOOST / RESOURCE BREEZE) LIQD Take 1 Container by mouth 3 (three) times daily between meals.  . metoprolol succinate (TOPROL-XL) 50 MG 24 hr tablet Take 1 tablet (50 mg total) by mouth every 12 (twelve) hours. Take with or immediately following a meal.  . oxyCODONE-acetaminophen (PERCOCET) 10-325 MG tablet Take 1 tablet by mouth every 4 (four) hours as needed for pain. Do not take more than 5 tablets in 24 hours.  . rosuvastatin (CRESTOR) 20 MG tablet TAKE 1 TABLET EVERY DAY AT 6 PM  . senna-docusate (SENOKOT-S) 8.6-50 MG tablet Take 2 tablets by mouth 2 (two) times daily. (Patient taking differently: Take 1 tablet by mouth at bedtime. )  . tiotropium (SPIRIVA) 18 MCG inhalation capsule Place 1 capsule (18 mcg total) into inhaler and inhale daily.     Allergies: Allergies  as of 07/14/2017 - Review Complete 07/14/2017  Allergen Reaction Noted  . Amiodarone Other (See Comments) 07/30/2015  . Penicillins Rash and Other (See Comments)    Past Medical History:  Diagnosis Date  . Alcohol abuse   . Anxiety   . Asbestos exposure   . Avascular necrosis of bones of both hips (HCC)    Status post bilateral hip replacements  . Bipolar disorder ([place])   . Chronic systolic heart failure ([place])    a. NICM - EF 35% with normal cors 2003. b. Last echo 11/2012 - EF 25-30%.  . CKD (chronic kidney disease), stage III ([place])    Renal U/S 12/04/2009 showed no pathological findings. Labs 12/04/2009 include normal ESR, C3, C4; neg ANA; SPEP showed nonspecific increase in the alpha-2 region with no M-spike; UPEP showed no monoclonal free light chains;  urine IFE showed polyclonal increase in feree Kappa and/or free Lambda light chains. Baseline Cr reported 1.7-2.5.  [name] COPD (chronic obstructive pulmonary disease) ([place])   . Depression   . DVT (deep venous thrombosis) ([place])    He has hypercoagulability with multiple prior DVTs  . E coli bacteremia   . Erectile dysfunction   . Failure to thrive (0-17) 08/2016   DEHYDRATION  . Gastritis   . GERD (gastroesophageal reflux disease)   . HCAP (healthcare-associated pneumonia) 08/24/2015  . Hydrocele, unspecified   . Hyperlipemia   . Hypertension   . 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  Thyroid scan 08/28/2009 was normal with no focal areas of abnormal increased or decreased activity seen; the uptake of I 131 sodium iodide at 24 hours was 5.7%.  TSH and free T4 normalized by 08/16/2009.  . Intestinal obstruction ([place])   . Lung nodule    Chest CT scan on 12/14/2008 showed a nodular opacity at the left lung base felt to most likely represent scarring.  Follow-up chest CT scan on 06/02/2010 showed parenchymal scarring in the left apex, left  lower lobe, and lingula; some of this scarring at the left base had a nodular appearance, unchanged. Chest 09/2012 - stable.  [name] NICM (nonischemic cardiomyopathy) ([place])   . Noncompliance   . On home oxygen therapy    "3L periodically" (08/24/2015)  . Osteoarthritis   . Permanent atrial fibrillation ([place])    a. failed ablation in 2010 due to inability to access/venous occlusions. b. failed multaq, amiodarone, tikosyn. c. has not been felt to be a good candidate for AVN ablation due to recurrent infections/bacteremia history.  . Pleural plaque    H/o asbestos exposure. Chest CT on 06/02/2010 showed stable extensive calcified pleural plaques involving the left hemithorax, consistent with asbestos related pleural disease.  . Portal vein thrombosis   . Protein calorie malnutrition ([place]) 04/2016  . Spondylosis   . STEMI (ST  elevation myocardial infarction) ([place]) 52/84/1324   a. felt embolic in nature due to noncompliance with anticoagulation, LHC not pursued.  . 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 b. St. Jude gen change 2010.  [name] Thrombocytopenia ([place])   . 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  . Weight loss    Normal colonoscopy by Dr. Olevia Perches on 11/06/2010.    Family History:  Family History  Problem Relation Age of Onset  . Hypertension Mother   . Asthma Mother   . Coronary artery disease Mother   .  Cancer Mother   . Stroke Mother   . Cancer Brother        Unsure of type.  [name] Heart disease Brother   . Heart attack Brother   . Heart attack Sister   . Colon cancer Neg Hx   . Lung cancer Neg Hx   . Prostate cancer Neg Hx      Social History:  Social History   Tobacco Use  . Smoking status: Current Some Day Smoker    Packs/day: 0.25    Years: 54.00    Pack years: 13.50    Types: Cigarettes    Start date: 66  . Smokeless tobacco: Current User  . Tobacco comment: cutting back  Substance Use Topics  . Alcohol use: No    Alcohol/week: 0.0 oz    Comment: History of ETOH Abuse."  . Drug use: Yes    Frequency: 1.0 times per week    Types: Marijuana    Comment: Every other day.      Review of Systems: A complete ROS was negative except as per HPI.   Physical Exam: Blood pressure 96/84, pulse 78, temperature 98.7 F (37.1 C), temperature source Rectal, resp. rate 19, height '6\' 3"'  (1.905 m), weight 131 lb (59.4 kg), SpO2 100 %. General: Frail, chronically ill appearing gentleman resting in bed comfortably, no acute distress Head: Normocephalic, atraumatic  Eyes: PERRL, EOMI, normal conjuctiva  ENT: Lingual fissures and slightly dry mucus membranes, no pharyngeal exudate  CV: Irregular rhythm, tachycardic  Resp: Clear breath sounds  bilaterally with no wheezing at time of exam, normal work of breathing, no distress  Abd: Soft, +BS, slight tenderness to palpation  Extr: No LE edema, sarcopenic with little muscle mass  Neuro: Alert and oriented x3, 5/5 UE strength Skin: Warm, dry     EKG: personally reviewed my interpretation is slightly tachycardic with atrial fibrillation, high amplitude in V3, V4. Similar appearance to prior.   CXR: personally reviewed my interpretation is pacemaker present, stable pleural plaques, no focal consolidations.   Assessment & Plan by Problem:  COPD Exacerbation Pt presenting with increased oxygen requirement, increased cough and sputum production consistent with COPD exacerbation in the setting of generalized fatigue, loss of appetite which may represent a viral illness as a cause for this exacerbation. He had a similar admission in January, received flu shot this season. His CXR does not have evidence of focal consolidation or pneumonia, he is currently afebrile with mild leukocytosis. Will treat as a COPD exacerbation with supportive treatment, IVF for other sx.  --Monitor vital signs, fever --O2 goal 88-92% with supplemental O2 as needed for goal --Prednisone 40 mg PO (start tomorrow am) --Azithromycin 250 mg  --Duoneb q6hr prn --Cont home maintenance inhaler equivalents     Acute on Chronic Kidney Disease (CKD III), Dehydration Cr on presentation 2.06, he often has elevated Cr but appears baseline may be ~1.5. He has had decreased PO intake along with diarrhea likely leading to dehydration and this increase in Cr. Will continue to hydrate with IVF and monitor renal function.  --IVF 100 cc/hr  --BMP, avoid nephrotoxins --I/Os   Atrial Fibrillation H/o a fib on diltiazem, metoprolol, and eliquis 2.5 mg BID as an outpatient. He has not taken his medication today and currently has rates 110s-140s, also with acute illness likely contributing to RVR. Will resume home medications and  monitor for improvement in rate. --Tele --Cont home diltiazem, metoprolol, eliquis  Malnutrition Pt underweight with BMI 16.37, likely sarcopenic with  very little muscle mass. On home nutritional supplement and marinol. His lack of muscle mass may underestimate the degree of renal dysfunction based on Cr. --Cont home marinol, boost feeding supplement       Combined Systolic and Diastolic HF H/o HF with EF 30% on most recent echo. He currently appears dry, will monitor fluid status with gentle hydration carefully.     H/o Depression On home buproprion, will continue as inpatient.    Dispo: Admit patient to Inpatient with expected length of stay greater than 2 midnights.  Signed: Tawny Asal, MD 07/14/2017, 5:03 PM  Pager: [phone]

## 2017-07-14 NOTE — ED Notes (Signed)
IV attempt x2.

## 2017-07-14 NOTE — ED Triage Notes (Signed)
Pt arrived via [facility] from home c/o SOB and chest pain x1 week.  Pt took 325 ASA PTA.  Non ambulatory at baseline.  Wears 3L O2 [place] continuous.

## 2017-07-14 NOTE — Progress Notes (Signed)
Pt admitted and oriented to unit. 2200: Pt currently in afib, HR 120s-140s. Gave scheduled cardizem and metoprolol. HR still sustaining 120s-140s. Paged internal medicine on-cal provider. Received orders for 2.5mg  metoprolol IV push for HR >120, hold SBP<110 repeat every 45min. Gave one dose.  2300: HR still 120s-140s BP 103/73 2315: HR still 120s-140s BP 103/76. Paged internal medicine on-call provider again. Awaiting orders. Pt c/o CP 5/10, and diaphoretic. 2350: BP 97/70 HR 125 Will continue to monitor. Clint Bolder, RN 07/14/17 11:58 PM

## 2017-07-15 DIAGNOSIS — M6284 Sarcopenia: Secondary | ICD-10-CM

## 2017-07-15 DIAGNOSIS — I482 Chronic atrial fibrillation: Secondary | ICD-10-CM

## 2017-07-15 DIAGNOSIS — L8990 Pressure ulcer of unspecified site, unspecified stage: Secondary | ICD-10-CM | POA: Diagnosis present

## 2017-07-15 DIAGNOSIS — Z7901 Long term (current) use of anticoagulants: Secondary | ICD-10-CM

## 2017-07-15 DIAGNOSIS — E860 Dehydration: Secondary | ICD-10-CM

## 2017-07-15 DIAGNOSIS — Z79899 Other long term (current) drug therapy: Secondary | ICD-10-CM

## 2017-07-15 DIAGNOSIS — N183 Chronic kidney disease, stage 3 (moderate): Secondary | ICD-10-CM

## 2017-07-15 LAB — URINALYSIS, ROUTINE W REFLEX MICROSCOPIC
Bacteria, UA: NONE SEEN
Bilirubin Urine: NEGATIVE
Glucose, UA: NEGATIVE mg/dL
Hgb urine dipstick: NEGATIVE
Ketones, ur: 5 mg/dL — AB
Nitrite: NEGATIVE
Protein, ur: NEGATIVE mg/dL
Specific Gravity, Urine: 1.024 (ref 1.005–1.030)
pH: 5 (ref 5.0–8.0)

## 2017-07-15 LAB — CBC
HCT: 42.1 % (ref 39.0–52.0)
Hemoglobin: 14.1 g/dL (ref 13.0–17.0)
MCH: 31.1 pg (ref 26.0–34.0)
MCHC: 33.5 g/dL (ref 30.0–36.0)
MCV: 92.9 fL (ref 78.0–100.0)
Platelets: 120 10*3/uL — ABNORMAL LOW (ref 150–400)
RBC: 4.53 MIL/uL (ref 4.22–5.81)
RDW: 14.8 % (ref 11.5–15.5)
WBC: 7.9 10*3/uL (ref 4.0–10.5)

## 2017-07-15 LAB — BASIC METABOLIC PANEL
Anion gap: 9 (ref 5–15)
BUN: 39 mg/dL — ABNORMAL HIGH (ref 6–20)
CO2: 18 mmol/L — ABNORMAL LOW (ref 22–32)
Calcium: 8.6 mg/dL — ABNORMAL LOW (ref 8.9–10.3)
Chloride: 113 mmol/L — ABNORMAL HIGH (ref 101–111)
Creatinine, Ser: 1.65 mg/dL — ABNORMAL HIGH (ref 0.61–1.24)
GFR calc Af Amer: 46 mL/min — ABNORMAL LOW (ref >60)
GFR calc non Af Amer: 40 mL/min — ABNORMAL LOW (ref >60)
Glucose, Bld: 169 mg/dL — ABNORMAL HIGH (ref 65–99)
Potassium: 4.1 mmol/L (ref 3.5–5.1)
Sodium: 140 mmol/L (ref 135–145)

## 2017-07-15 LAB — MRSA PCR SCREENING: MRSA by PCR: NEGATIVE

## 2017-07-15 MED ORDER — SODIUM CHLORIDE 0.9 % IV SOLN
INTRAVENOUS | Status: AC
  Administered 2017-07-15: 16:00:00 via INTRAVENOUS

## 2017-07-15 MED ORDER — ORAL CARE MOUTH RINSE
15.0000 mL | Freq: Two times a day (BID) | OROMUCOSAL | Status: DC
  Administered 2017-07-15 – 2017-07-18 (×5): 15 mL via OROMUCOSAL

## 2017-07-15 MED ORDER — ALUM & MAG HYDROXIDE-SIMETH 200-200-20 MG/5ML PO SUSP
30.0000 mL | ORAL | Status: DC | PRN
  Administered 2017-07-15 – 2017-07-16 (×2): 30 mL via ORAL
  Filled 2017-07-15 (×2): qty 30

## 2017-07-15 MED ORDER — ONDANSETRON HCL 4 MG PO TABS
4.0000 mg | ORAL_TABLET | Freq: Three times a day (TID) | ORAL | Status: DC | PRN
  Administered 2017-07-15: 4 mg via ORAL
  Filled 2017-07-15 (×2): qty 1

## 2017-07-15 MED ORDER — ONDANSETRON HCL 4 MG/2ML IJ SOLN: 4.0000 mg | Freq: Three times a day (TID) | INTRAMUSCULAR | Status: DC | PRN

## 2017-07-15 MED ORDER — METOPROLOL TARTRATE 5 MG/5ML IV SOLN
2.5000 mg | INTRAVENOUS | Status: DC | PRN
  Administered 2017-07-17: 2.5 mg via INTRAVENOUS
  Filled 2017-07-15: qty 5

## 2017-07-15 NOTE — Progress Notes (Addendum)
Paged by nurse, patient with persistent A-fib w/ RVR rate ~130 +/-10 bpm but otherwise stable. Treated with IV metoprolol 2.5mg .   Paged again for afib w/ RVR unresponsive to initial IV metoprolol Tx. Treated again with IV metoprolol 2.5 with minimal improvement ~115 range as per verbal read with charted values ranging even higher. Patient remains otherwise stable.    Call placed to nurse to request results of second treatment as above whereupon this MD was informed that they deemed it necessary to place the patient on Stepdown level cardiac monitoring due to the irregularity of his rhythm and as such they were requesting that he be transferred to [facility] within the system. This was necessary to better facilitate his high level of care. As they are a transition unit, they would be able to facilitate that transfer.   Given the patients soft BP of 100/85 most recent with lower recorded, as well as a lack of sufficient response to low dose IV metoprolol in combination with his PO medications, the potential need for an additional rate control medication requiring stepdown level care was high. The transfer order to stepdown was placed.   Metoprolol IV @ 2.5 mg push continued with lowered hold parameters.    I feel that his rate will improve when the underlying cause has been treated sufficiently. We will continue to treat his COPD and CKD III concurrently   Will go see.   Addendum: Patient was lying quietly asleep in his bed upon entering the room. He was slightly startled awake upon initiation of the conversation but he was otherwise comfortable and stable. HR was 109 +/-10 during evaluation. No indication to advance treatment further. HR of ~120 is well tolerated by the patient and again I strongly suspect that this will improve as his acute illness resolves. Given his soft BP, will tolerate a slightly higher HR ~120 +/-10 at this time granted he is otherwise asymptomatic.   Kathi Ludwig, MD

## 2017-07-15 NOTE — Progress Notes (Signed)
   Subjective: Pt had episodes of RVR overnight treated with dose of beta blocker, had also resumed his home medications shortly prior. His rate has since improved and in the 90s during rounds this morning. He states he continues to feel generally ill with decreased appetite (further decreased compared to his baseline poor appetite) and also c/o of increased chronic pain. Feels his breathing has improved since admission.   Objective:  Vital signs in last 24 hours: Vitals:   07/15/17 0645 07/15/17 0700 07/15/17 0810 07/15/17 1109  BP: 100/74  109/74 (!) 112/96  Pulse:  (!) 103 97 100  Resp:    (!) 21  Temp:  97.9 F (36.6 C)  98.1 F (36.7 C)  TempSrc:  Oral  Oral  SpO2: 100%   98%  Weight:      Height:       General: Frail, chronically ill appearing gentleman resting in bed, no acute distress HEENT: Normal conjuctiva, [place] in place  CV: Irregular rhythm, regular rate  Resp: No wheezing this morning, diminished breath sounds, normal work of breathing, no distress  Abd: Soft, +BS, mild tenderness to palpation  Extr: No LE edema, sarcopenia  Neuro: Alert and oriented x3  Skin: Warm, dry      Assessment/Plan:  COPD Exacerbation  Pt presenting with increased oxygen requirement, increased cough and sputum production consistent with COPD exacerbation in the setting of generalized fatigue, loss of appetite which may represent a viral illness as a cause for this exacerbation. His CXR does not have evidence of focal consolidation or pneumonia, he is currently afebrile with mild leukocytosis. He has improved from a respiratory standpoint.  --Monitor vital signs --O2 goal 88-92%, wean supplemental O2 to reach goal --Cont Prednisone 40 mg  --Cont Azithromycin  --Duoneb q6hr prn  --Cont home maintenance inhaler equivalents   Acute on Chronic Kidney Disease (CKD III), Dehydration Cr on presentation 2.06, baseline ~1.5. Likely due to decreased PO intake with diarrhea. Has improved with IV  hydration, nearing baseline at 1.65. --Cont IVF for now --BMP, avoid nephrotoxins   Atrial Fibrillation On home diltiazem, metoprolol, and eliquis with periods of RVR on admission and overnight likely due to missed medication doses and acute illness. Has improved today with resuming home medications --Cont home diltiazem, metoprolol, eliquis    Dispo: Anticipated discharge in approximately 2-3 day(s) pending improvement of respiratory status   Tawny Asal, MD 07/15/2017, 11:31 AM Pager: [phone]

## 2017-07-15 NOTE — Progress Notes (Signed)
PT Cancellation Note  Patient Details Name: [name] MRN: [id number] DOB: Jun 15, 1945   Cancelled Treatment:    Reason Eval/Treat Not Completed: Patient declined, no reason specified, pt reporting ongoing N/V and declined therapy.  Requesting therapist return tomorrow.     Michel Santee 07/15/2017, 3:47 PM

## 2017-07-15 NOTE — Progress Notes (Signed)
Pt refused AM labs. Will continue to monitor. Clint Bolder, RN 07/15/17 2:48 AM

## 2017-07-15 NOTE — Progress Notes (Signed)
Date: 07/15/2017  Patient name: [name]  Medical record number: [id number]  Date of birth: Aug 03, 1945   I saw and evaluated the patient. I reviewed the resident's note and I agree with the resident's findings and plan as documented in the resident's note.  Chief Complaint(s): Shortness of breath  History - key components related to admission:  Please see resident note for details.  Briefly, [name] is a 72 year old man with a history of COPD on 3 L home O2, A. fib, CHF, CKD stage III, tachybradycardia syndrome with PPM, and PVD who presented with shortness of breath and cough accompanied by diarrhea. He reports about a week of symptoms prior to admission. He has had a productive cough with increased sputum, difficulty breathing, decreased appetite, fatigue, and diarrhea. He has had subjective fevers but did not take his temperature, and has also had some chest pain that occurs with coughing.  Overall, he reports difficulty with functioning at home and is essentially wheelchair bound. He reports poor appetite for the last 2 years with what he reports is more than 100 pounds of weight loss.  Physical Exam - key components related to admission:  Vitals:   07/15/17 0645 07/15/17 0700 07/15/17 0810 07/15/17 1109  BP: 100/74  109/74 (!) 112/96  Pulse:  (!) 103 97 100  Resp:    (!) 21  Temp:  97.9 F (36.6 C)  98.1 F (36.7 C)  TempSrc:  Oral  Oral  SpO2: 100%   98%  Weight:      Height:        General: Alert, pleasant, not in distress, cachectic and frail HEENT: Dry mucous membranes Neck: JVP to just above the clavicle at 45, no lymphadenopathy Cardiovascular: Irregular, rate approximately 90, no murmurs rubs or gallops Pulmonary: Distant lung sounds, prolonged expiratory phase, scattered rhonchi, no wheezing, mild accessory muscle use Abdominal: Soft, nontender, not distended, normal bowel sounds Extremities: Warm, no edema Skin: No significant rashes or lesions Neuro:  Alert and oriented, speech normal  Significant test results: CXR with stable calcified pleural plaques WBC 13.8 Troponin negative BNP 185 Creatinine 2.1 down to 1.6 this morning Influenza negative  Assessment and Plan: I have seen and evaluated the patient as outlined in the resident's note. I agree with the formulated Assessment and Plan as detailed in the resident's note, with the following changes:   Principal Problem:   COPD with acute exacerbation ([place]) Active Problems:   Chronic combined systolic and diastolic congestive heart failure (HCC)   Longstanding persistent atrial fibrillation ([place])   Long term current use of opiate analgesic   CKD (chronic kidney disease) stage 3, GFR 30-59 ml/min (HCC)   Dehydration   Orthostatic dizziness   Pressure injury of skin   1. Severe COPD with acute exacerbation: He presents with all of the cardinal symptoms, including increased shortness of breath, increased cough, and increased sputum production. He has an increased oxygen requirement right now, and we will treat him with antibiotics and steroids until he is back to his baseline respiratory status.  His overall functional status seems very poor and his prognosis in the big picture is quite limited. In my discussion with him, he indicated that his providers have not discussed this much with him. Given his cachexia, poor appetite, weight loss, and significant functional limitations, along with his current exacerbation, it is appropriate to involve palliative care, and we will consult them for assistance with discharge planning and further discussions of goals of care.  Oda Kilts, MD 2/26/20191:33 PM

## 2017-07-15 NOTE — Progress Notes (Addendum)
Initial Nutrition Assessment  DOCUMENTATION CODES:   Severe malnutrition in context of chronic illness, Underweight  INTERVENTION:    Ensure Enlive po BID, each supplement provides 350 kcal and 20 grams of protein  Boost Plus po TID, each supplement provides 360 kcal and 14 grams of protein  NUTRITION DIAGNOSIS:   Severe Malnutrition related to chronic illness(COPD) as evidenced by severe fat depletion, severe muscle depletion, energy intake < 75% for > or equal to 1 month  GOAL:   Patient will meet greater than or equal to 90% of their needs  MONITOR:   PO intake, Supplement acceptance, Labs, Skin, Weight trends, I & O's  REASON FOR ASSESSMENT:   Malnutrition Screening Tool  ASSESSMENT:   72 y.o. Male with PMH of heart failure with EF 30%, COPD on chronic oxygen therapy, tachy-bradycardia syndrome with pacemaker, renal insufficiency is here for evaluation of pains in his chest for one week described as sharp starting on the right side of his chest moving to the left side worse with movement, coughing and taking deep breaths.   Pt known to Clinical Nutrition during previous hospital admissions. This RD spoke with pt at bedside 07/15/17. Reports he's nauseous. Has his garbage bin close to bed.  Pt also reveals he consumes 3 meals per day "very seldom". Has been eating only 1 meal per day for months (sandwich or soup). States's he's had severe weight loss over the last 2 years (> 100 lbs).  Medications include Marinol, IV Zofran and ABX. Palliative Medicine Team consulted. Labs reviewed. Glucose 169.  NUTRITION - FOCUSED PHYSICAL EXAM:    Most Recent Value  Orbital Region  Severe depletion  Upper Arm Region  Severe depletion  Thoracic and Lumbar Region  Severe depletion  Buccal Region  Severe depletion  Temple Region  Severe depletion  Clavicle Bone Region  Severe depletion  Clavicle and Acromion Bone Region  Severe depletion  Scapular Bone Region  Severe depletion   Dorsal Hand  Severe depletion  Patellar Region  Severe depletion  Anterior Thigh Region  Severe depletion  Posterior Calf Region  Severe depletion  Edema (RD Assessment)  None     Diet Order:  Diet regular Room service appropriate? Yes; Fluid consistency: Thin  EDUCATION NEEDS:   No education needs have been identified at this time  Skin:  Skin Assessment: Reviewed RN Assessment  Last BM:  2/25   Intake/Output Summary (Last 24 hours) at 07/15/2017 1453 Last data filed at 07/15/2017 0400 Gross per 24 hour  Intake 2035 ml  Output -  Net 2035 ml   Height:   Ht Readings from Last 1 Encounters:  07/14/17 6\' 3"  (1.905 m)   Weight:   Wt Readings from Last 1 Encounters:  07/14/17 126 lb 5.2 oz (57.3 kg)   Ideal Body Weight:  89 kg  BMI:  Body mass index is 15.79 kg/m.  Estimated Nutritional Needs:   Kcal:  1900-2100  Protein:  90-105 gm  Fluid:  1.9-2.1 L  Arthur Holms, RD, LDN Pager #: [phone] After-Hours Pager #: [phone]

## 2017-07-16 ENCOUNTER — Encounter: Payer: Medicare Other | Admitting: Internal Medicine

## 2017-07-16 DIAGNOSIS — Z515 Encounter for palliative care: Secondary | ICD-10-CM

## 2017-07-16 DIAGNOSIS — J441 Chronic obstructive pulmonary disease with (acute) exacerbation: Principal | ICD-10-CM

## 2017-07-16 DIAGNOSIS — N179 Acute kidney failure, unspecified: Secondary | ICD-10-CM

## 2017-07-16 DIAGNOSIS — I5042 Chronic combined systolic (congestive) and diastolic (congestive) heart failure: Secondary | ICD-10-CM

## 2017-07-16 DIAGNOSIS — Z66 Do not resuscitate: Secondary | ICD-10-CM

## 2017-07-16 DIAGNOSIS — I480 Paroxysmal atrial fibrillation: Secondary | ICD-10-CM

## 2017-07-16 LAB — BASIC METABOLIC PANEL
Anion gap: 7 (ref 5–15)
BUN: 28 mg/dL — ABNORMAL HIGH (ref 6–20)
CO2: 19 mmol/L — ABNORMAL LOW (ref 22–32)
Calcium: 8.3 mg/dL — ABNORMAL LOW (ref 8.9–10.3)
Chloride: 112 mmol/L — ABNORMAL HIGH (ref 101–111)
Creatinine, Ser: 1.31 mg/dL — ABNORMAL HIGH (ref 0.61–1.24)
GFR calc Af Amer: >60 mL/min (ref >60)
GFR calc non Af Amer: 52 mL/min — ABNORMAL LOW (ref >60)
Glucose, Bld: 133 mg/dL — ABNORMAL HIGH (ref 65–99)
Potassium: 4.7 mmol/L (ref 3.5–5.1)
Sodium: 138 mmol/L (ref 135–145)

## 2017-07-16 MED ORDER — SODIUM CHLORIDE 0.9 % IV SOLN: INTRAVENOUS | Status: AC

## 2017-07-16 NOTE — Progress Notes (Signed)
  Date: 07/16/2017  Patient name: [name]  Medical record number: [id number]  Date of birth: [birth year]   I have seen and evaluated this patient and I have discussed the plan of care with the house staff. Please see their note for complete details. I concur with their findings with the following additions/corrections:   [name] reports feeling a little bit better this morning, with improved respiratory status. Her able to turn on his oxygen on rounds, and we'll see if he is requiring much O2 during the day. He only uses it as needed at home. We will continue azithromycin and steroids for his acute COPD exacerbation. His blood pressure was soft this morning and his rate control has been good, so we temporarily held his diltiazem and continued his metoprolol. We'll continue to watch his heart rates and blood pressures closely and adjust his meds as needed.  As discussed yesterday, he has very poor long-term prognosis and we have asked palliative care to see him and assist with discharge planning and goals of care discussions. It appears palliative care may have been involved in his care in the past, but is not clear if he has had any follow-up for clarification of goals of care and discussion around prognosis with his PCP, cardiologist, or pulmonologist.  Lenice Pressman, M.D., Ph.D. 07/16/2017, 1:20 PM

## 2017-07-16 NOTE — Progress Notes (Addendum)
   Subjective: No acute events overnight. He feels his breathing has continued to improve and feels better overall this morning. Still having abdominal pain and decreased appetite, but diarrhea has improved. Yesterday he declined working with PT, still feels he is too tired/weak to work with them today but he understands importance of their assessment and states he will decide day by day. BP noted to be low this morning, pt asymptomatic--holding diltiazem.   During rounds, oxygen turned down to room air, pt maintaining oxygen saturations well while talking.   Objective:  Vital signs in last 24 hours: Vitals:   07/16/17 0200 07/16/17 0342 07/16/17 0829 07/16/17 1024  BP: 103/61 100/76  115/76  Pulse:    (!) 113  Resp:      Temp:  97.8 F (36.6 C)    TempSrc:  Oral    SpO2: 100%  100%   Weight:      Height:       General: Frail, chronically ill appearing gentleman resting in bed, no acute distress HEENT: Normal conjuctiva, [place] in place, no JVD  CV: Irregular rhythm, regular rate  Resp: No wheezing, diminished and coarse breath sounds, normal work of breathing, no distress  Abd: Soft, +BS, stable mild tenderness to palpation to lower abd   Extr: No LE edema, sarcopenia  Neuro: Alert and oriented x3  Skin: Warm, dry      Assessment/Plan:  COPD Exacerbation  Pt presenting with increased oxygen requirement, increased cough and sputum production consistent with COPD exacerbation in the setting of generalized fatigue, loss of appetite which may represent a viral illness as a cause for this exacerbation. His CXR did not have evidence of focal consolidation or pneumonia. He has steadily improved with prednisone and azithromycin. Given his overall decline and COPD, palliative care consulted for goals of care discussion, appreciate their assistance.  --Monitor vital signs --O2 goal 88-92%, wean supplemental O2 to reach goal --Cont Prednisone 40 mg  --Cont Azithromycin  --Duoneb q6hr prn    --Cont home maintenance inhaler equivalents --F/u palliative care discussions    Acute on Chronic Kidney Disease (CKD III), Dehydration Cr on presentation 2.06, baseline ~1.5. Likely due to decreased PO intake with diarrhea. Has improved with IV hydration and at baseline, currently 1.3.  --Cont gentle IVF for now as pt not having much intake  --BMP, avoid nephrotoxins   Atrial Fibrillation On home diltiazem, metoprolol, and eliquis with periods of RVR on admission and overnight likely due to missed medication doses and acute illness. Has improved today with resuming home medications. With soft blood pressures, will hold diltiazem and monitor closely.  --Cont home metoprolol, eliquis --Hold home diltiazem for now     Dispo: Anticipated discharge in approximately 2-3 day(s) pending improvement of respiratory status   Tawny Asal, MD 07/16/2017, 11:33 AM Pager: [phone]

## 2017-07-16 NOTE — Consult Note (Signed)
Consultation Note Date: 07/16/2017   Patient Name: [name]  DOB: [birth year]  MRN: [id number]  Age / Sex: 72 y.o., male  PCP: Travis Finders, MD Referring Physician: Oda Kilts, MD  Reason for Consultation: Establishing goals of care, Non pain symptom management, Pain control and Psychosocial/spiritual support  HPI/Patient Profile: 72 y.o. male  admitted on 07/14/2017 with cough and shortness of breath. He has a past medical history of COPD (on 3 L home O2 prn), a-fib, CHF, HTN, CKD III, tachy-brady s/p pacemaker, PVD, and depression. He reports about a week [name] history of multiple symptoms including diarrhea, cough productive of thick and yellow sputum, shortness of breath, and fatigue. He has intermittent chest pain which is sharp and occurs with coughing. He notes subjective fever but had not taken his temperature. He reports decreased PO intake over the course of his illness, with his last full meal being 3 days prior to admission. He has had little fluid or other intake since that time. He has a grandson who was recently sick with sore throat, ear ache.     Clinical Assessment and Goals of Care: I have reviewed medical records including MAR, labs, and imaging results, assessed the patient and had a discussion with him and his friend at the bedside, regarding his current health status, disposition and options. He is alert and oriented x4 and lying awake in bed. His friend of 30 years was at the bedside and [name] provided consent to speak openly in her presence.   I reintroduced Palliative Medicine to him. He has been seen by our team on previous admissions.   We discussed a brief life review of the patient. He is a widower and has 10 children (9 sons and 1 daughter). Several of his children live in state and others live out of state. 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 He loves  watching sports (football and basketball). He has a grandson (54 yo) that visits often and helps him with chores around the house which he enjoys. His daughter [name]) is his primary caregiver and chosen HCPOA. He reports she comes in to check on him daily and runs all his errands etc. Including appointments. [name] states he doesn't attend church regularly but is a [employer].    As far as functional and nutritional status [name] reports that he uses a rolling walker outside of the home and inside the home he uses a wheelchair, due to limited mobility and pain. He is on Oxycodone due to chronic hip/leg pain and neuropathy. He reports over the past 2 years a decrease in his appetite. He receives Meals on Wheels once a day, as his primary meal source. His daughter also prepares meals for him. He states 2 years ago he weighed 245lbs.  Now, he is 125lbs (6'3" ht). He tries to drink 2-3 ensure daily if possible, however this is a challenge at times due to the cost. He feels as though some of his  decline may be related to his past history of noncompliance with required medications. However, he is now taking all medications as prescribed and his daughter manages this weekly.    I attempted to elicit values and goals of care important to the patient.  His family and being in his own home is important to him. He verbalizes having been to rehab facilities in the past and not caring much for them.    Advanced directives and concepts specific to code status were considered and discussed. He states his daughter, [name] is his HCPOA. Chaplain services will be consulted to assist him with completion Advance Directive paperwork. He wishes to be a DNR/DNI and has made this known to his daughter and other children.   Questions and concerns were addressed. [name] was encouraged to call with questions or concerns.  PMT will continue to support holistically as needed.    Primary Decision Maker: Patient  is capable of making decisions  [name], daughter Education officer, community)     SUMMARY OF RECOMMENDATIONS    DNR/DNI  Chaplain consult-to assist patient with advance directives completion  Agree with Boost/Ensure feeding supplements per Dietician  Agree with continuing to treat the treatable per attending   Hopeful patient will participate with PT evaluation-declined the past several days and reports not interested in a rehabilitation facility   Not Hospice eligible yet.  Code Status/Advance Care Planning:  DNR    Symptom Management:   Percocet/Oxy IR PO PRN as ordered for pain control   Zofran PRN for nausea   Boost/Ensure/Marinol for nutritional support    Additional Recommendations (Limitations, Scope, Preferences):  Full Scope Treatment  Psycho-social/Spiritual:   Desire for further Chaplaincy support:yes-for advanced directive completion  Prognosis:   Unable to determine  Discharge Planning: To Be Determined-anticipate patient will be discharged back home.  Refuses SNF.     Primary Diagnoses: Present on Admission: . COPD with acute exacerbation ([name]) . Chronic combined systolic and diastolic congestive heart failure ([name]) . Longstanding persistent atrial fibrillation ([name]) . CKD (chronic kidney disease) stage 3, GFR 30-59 ml/min ([name]) . Dehydration . Pressure injury of skin . Severe malnutrition ([name])   I have reviewed the medical record, interviewed the patient and family, and examined the patient. The following aspects are pertinent.  Past Medical History:  Diagnosis Date  . Alcohol abuse   . Anxiety   . Asbestos exposure   . Avascular necrosis of bones of both hips ([name])    Status post bilateral hip replacements  . Bipolar disorder ([place])   . Chronic systolic heart failure ([place])    a. NICM - EF 35% with normal cors 2003. b. Last echo 11/2012 - EF 25-30%.  . CKD (chronic kidney disease), stage III ([place])    Renal U/S 12/04/2009 showed no pathological  findings. Labs 12/04/2009 include normal ESR, C3, C4; neg ANA; SPEP showed nonspecific increase in the alpha-2 region with no M-spike; UPEP showed no monoclonal free light chains; urine IFE showed polyclonal increase in feree Kappa and/or free Lambda light chains. Baseline Cr reported 1.7-2.5.  [name] COPD (chronic obstructive pulmonary disease) ([place])   . Depression   . DVT (deep venous thrombosis) ([place])    He has hypercoagulability with multiple prior DVTs  . E coli bacteremia   . Erectile dysfunction   . Failure to thrive (0-17) 08/2016   DEHYDRATION  . Gastritis   . GERD (gastroesophageal reflux disease)   . HCAP (healthcare-associated pneumonia) 08/24/2015  . Hydrocele, unspecified   .  Hyperlipemia   . Hypertension   . 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  Thyroid scan 08/28/2009 was normal with no focal areas of abnormal increased or decreased activity seen; the uptake of I 131 sodium iodide at 24 hours was 5.7%.  TSH and free T4 normalized by 08/16/2009.  . Intestinal obstruction ([place])   . Lung nodule    Chest CT scan on 12/14/2008 showed a nodular opacity at the left lung base felt to most likely represent scarring.  Follow-up chest CT scan on 06/02/2010 showed parenchymal scarring in the left apex, left  lower lobe, and lingula; some of this scarring at the left base had a nodular appearance, unchanged. Chest 09/2012 - stable.  [name] NICM (nonischemic cardiomyopathy) ([place])   . Noncompliance   . On home oxygen therapy    "3L periodically" (08/24/2015)  . Osteoarthritis   . Permanent atrial fibrillation ([place])    a. failed ablation in 2010 due to inability to access/venous occlusions. b. failed multaq, amiodarone, tikosyn. c. has not been felt to be a good candidate for AVN ablation due to recurrent infections/bacteremia history.  . Pleural plaque    H/o asbestos exposure. Chest CT on 06/02/2010 showed stable extensive calcified pleural plaques involving  the left hemithorax, consistent with asbestos related pleural disease.  . Portal vein thrombosis   . Protein calorie malnutrition ([place]) 04/2016  . Spondylosis   . STEMI (ST elevation myocardial infarction) ([place]) 16/02/9603   a. felt embolic in nature due to noncompliance with anticoagulation, LHC not pursued.  . 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 b. St. Jude gen change 2010.  [name] Thrombocytopenia ([place])   . 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  . Weight loss    Normal colonoscopy by Dr. Olevia Perches on 11/06/2010.   Social History   Socioeconomic History  . Marital status: Widowed    Spouse name: None  . Number of children: 10  . Years of education: None  . Highest education level: None  Social Needs  . Financial resource strain: None  . Food insecurity - worry: None  . Food insecurity - inability: None  . Transportation needs - medical: None  . Transportation needs - non-medical: None  Occupational History  . Occupation: RETIRED    Employer: RETIRED  Tobacco Use  . Smoking status: Current Some Day Smoker    Packs/day: 0.25    Years: 54.00    Pack years: 13.50    Types: Cigarettes    Start date: 81  . Smokeless tobacco: Current User  . Tobacco comment: cutting back  Substance and Sexual Activity  . Alcohol use: No    Alcohol/week: 0.0 oz    Comment: History of ETOH Abuse."  . Drug use: Yes    Frequency: 1.0 times per week    Types: Marijuana    Comment: Every other day.   [name] Sexual activity: Not Currently  Other Topics Concern  . None  Social History Narrative   Retired Architect V40 yrs here in [place] 1 pack last 1.5 days tobacco, smokes marijuana    Denies EtOH x 4 years (on 10/12/12)   9 kids    From [place]   [place] Veteran    12 grade education          Family History  Problem Relation Age  of  Onset  . Hypertension Mother   . Asthma Mother   . Coronary artery disease Mother   . Cancer Mother   . Stroke Mother   . Cancer Brother        Unsure of type.  [name] Heart disease Brother   . Heart attack Brother   . Heart attack Sister   . Colon cancer Neg Hx   . Lung cancer Neg Hx   . Prostate cancer Neg Hx    Scheduled Meds: . apixaban  2.5 mg Oral BID  . aspirin  81 mg Oral Daily  . buPROPion  100 mg Oral BID  . diltiazem  120 mg Oral Daily  . dronabinol  10 mg Oral BID AC  . feeding supplement  1 Container Oral TID BM  . feeding supplement (ENSURE ENLIVE)  237 mL Oral BID BM  . mouth rinse  15 mL Mouth Rinse BID  . metoprolol succinate  50 mg Oral Q12H  . predniSONE  40 mg Oral Q breakfast  . rosuvastatin  20 mg Oral q1800  . tiotropium  18 mcg Inhalation Daily   Continuous Infusions: . sodium chloride    . azithromycin Stopped (07/16/17 0920)   PRN Meds:.alum & mag hydroxide-simeth, ipratropium-albuterol, metoprolol tartrate, ondansetron **OR** ondansetron (ZOFRAN) IV, oxyCODONE-acetaminophen **AND** oxyCODONE Medications Prior to Admission:  Prior to Admission medications   Medication Sig Start Date End Date Taking? Authorizing Provider  albuterol (VENTOLIN HFA) 108 (90 Base) MCG/ACT inhaler Inhale 2 puffs into the lungs every 6 (six) hours as needed for wheezing or shortness of breath. 05/29/17  Yes Travis Finders, MD  apixaban (ELIQUIS) 2.5 MG TABS tablet Take 1 tablet (2.5 mg total) by mouth 2 (two) times daily. 06/19/17  Yes Alphonzo Grieve, MD  aspirin 81 MG chewable tablet Chew 1 tablet (81 mg total) by mouth daily. 02/12/17  Yes Travis Finders, MD  buPROPion [facility] SR) 100 MG 12 hr tablet Take 1 tablet (100 mg total) by mouth 2 (two) times daily. 05/27/17  Yes Rosita Fire, MD  diltiazem (CARDIZEM CD) 120 MG 24 hr capsule Take 1 capsule (120 mg total) by mouth daily. 06/30/17  Yes Travis Finders, MD  dronabinol (MARINOL) 10 MG capsule Take 10 mg by mouth 2  (two) times daily before a meal.   Yes [provider]  feeding supplement (BOOST / RESOURCE BREEZE) LIQD Take 1 Container by mouth 3 (three) times daily between meals. 09/09/16  Yes Burgess Estelle, MD  metoprolol succinate (TOPROL-XL) 50 MG 24 hr tablet Take 1 tablet (50 mg total) by mouth every 12 (twelve) hours. Take with or immediately following a meal. 06/19/17  Yes Alphonzo Grieve, MD  oxyCODONE-acetaminophen (PERCOCET) 10-325 MG tablet Take 1 tablet by mouth every 4 (four) hours as needed for pain. Do not take more than 5 tablets in 24 hours. 06/30/17 06/29/18 Yes Travis Finders, MD  rosuvastatin (CRESTOR) 20 MG tablet TAKE 1 TABLET EVERY DAY AT 6 PM 07/04/17  Yes Travis Finders, MD  senna-docusate (SENOKOT-S) 8.6-50 MG tablet Take 2 tablets by mouth 2 (two) times daily. Patient taking differently: Take 1 tablet by mouth at bedtime.  02/12/17  Yes Travis Finders, MD  tiotropium (SPIRIVA) 18 MCG inhalation capsule Place 1 capsule (18 mcg total) into inhaler and inhale daily. 07/04/17  Yes Travis Finders, MD  polyethylene glycol (MIRALAX / GLYCOLAX) packet Take 17 g by mouth daily as needed. Patient not taking: Reported on 05/22/2017 02/12/17   Travis Finders, MD  Allergies  Allergen Reactions  . Amiodarone Other (See Comments)    Thyroiditis in the setting of amiodarone use  . Penicillins Rash and Other (See Comments)    Has patient had a PCN reaction causing immediate rash, facial/tongue/throat swelling, SOB or lightheadedness with hypotension: Yes Has patient had a PCN reaction causing severe rash involving mucus membranes or skin necrosis: No Has patient had a PCN reaction that required hospitalization No Has patient had a PCN reaction occurring within the last 10 years: No If all of the above answers are "NO", then may proceed with Cephalosporin use.   Review of Systems  Constitutional: Positive for appetite change.  Respiratory:       Some shortness of breath and cough at times     Neurological:       Generalized weakness, bilateral hip and leg pain at times     Physical Exam  Constitutional: He is oriented to person, place, and time.  Pulmonary/Chest: Effort normal.  Currently on room air without shortness of breath   Neurological: He is alert and oriented to person, place, and time.    Vital Signs: BP 91/63 (BP Location: Left Arm)   Pulse 66   Temp 97.9 F (36.6 C) (Oral)   Resp 16   Ht 6' 3" (1.905 m)   Wt 57.3 kg (126 lb 5.2 oz)   SpO2 100%   BMI 15.79 kg/m  Pain Assessment: 0-10   Pain Score: 6    SpO2: SpO2: 100 % O2 Device:SpO2: 100 % O2 Flow Rate: .O2 Flow Rate (L/min): 2 L/min  IO: Intake/output summary:   Intake/Output Summary (Last 24 hours) at 07/16/2017 1305 Last data filed at 07/16/2017 1153 Gross per 24 hour  Intake 1281.25 ml  Output 800 ml  Net 481.25 ml    LBM: Last BM Date: 07/15/17 Baseline Weight: Weight: 59.4 kg (131 lb) Most recent weight: Weight: 57.3 kg (126 lb 5.2 oz)     Palliative Assessment/Data: PPS 60%     Time In: 1230 Time Out: 1330 Time Total: 60 min.   Greater than 50%  of this time was spent counseling and coordinating care related to the above assessment and plan.  Signed by:  Alda Lea, AGNP-C Florentina Jenny, PA-C Palliative Medicine Team  Phone: [phone] Fax: [phone]   Please contact Palliative Medicine Team phone at [phone] for questions and concerns.  For individual provider: See Shea Evans

## 2017-07-16 NOTE — Progress Notes (Signed)
PT Cancellation Note  Patient Details Name: CORDARREL STIEFEL MRN: [id number] DOB: 21-Mar-1946   Cancelled Treatment:    Reason Eval/Treat Not Completed: Other (comment)(Pt states that MD told him he could get therapy tomorrow. )   Denice Paradise 07/16/2017, 11:54 AM [facility] [phone] (pager)

## 2017-07-17 LAB — BASIC METABOLIC PANEL
Anion gap: 6 (ref 5–15)
BUN: 25 mg/dL — ABNORMAL HIGH (ref 6–20)
CO2: 19 mmol/L — ABNORMAL LOW (ref 22–32)
Calcium: 8.3 mg/dL — ABNORMAL LOW (ref 8.9–10.3)
Chloride: 112 mmol/L — ABNORMAL HIGH (ref 101–111)
Creatinine, Ser: 1.18 mg/dL (ref 0.61–1.24)
GFR calc Af Amer: >60 mL/min (ref >60)
GFR calc non Af Amer: 59 mL/min — ABNORMAL LOW (ref >60)
Glucose, Bld: 112 mg/dL — ABNORMAL HIGH (ref 65–99)
Potassium: 4.7 mmol/L (ref 3.5–5.1)
Sodium: 137 mmol/L (ref 135–145)

## 2017-07-17 NOTE — Evaluation (Signed)
Physical Therapy Evaluation Patient Details Name: [name] MRN: [id number] DOB: 13-Jun-1945 Today's Date: 07/17/2017   History of Present Illness  72 y.o. male  admitted on 07/14/2017 with cough and shortness of breath. He has a past medical history of COPD (on 3 L home O2 prn), a-fib, CHF, HTN, CKD III, tachy-brady s/p pacemaker, PVD, and depression. He reports about a week long history of multiple symptoms including diarrhea, cough productive of thick and yellow sputum, shortness of breath, and fatigue. Admit for COPD exacerbation.   Clinical Impression  Pt admitted with above diagnosis. Pt currently with functional limitations due to the deficits listed below (see PT Problem List). Pt was able to transfer to chair with min guard assist only.  Should be safe to go home and transfer to wheelchair on his own and receive [facility] services.  Pt will benefit from skilled PT to increase their independence and safety with mobility to allow discharge to the venue listed below.      Follow Up Recommendations Home health PT;Supervision - Intermittent    Equipment Recommendations  None recommended by PT    Recommendations for Other Services       Precautions / Restrictions Precautions Precautions: Fall Restrictions Weight Bearing Restrictions: No      Mobility  Bed Mobility Overal bed mobility: Independent                Transfers Overall transfer level: Needs assistance Equipment used: Rolling walker (2 wheeled) Transfers: Sit to/from [facility] Sit to Stand: Min guard Stand pivot transfers: Min guard       General transfer comment: cues for hand placement and steadying assist  Ambulation/Gait                Stairs            Wheelchair Mobility    Modified Rankin (Stroke Patients Only)       Balance Overall balance assessment: Needs assistance         Standing balance support: Bilateral upper extremity supported;During functional  activity Standing balance-Leahy Scale: Poor Standing balance comment: Steadying assist provided.                             Pertinent Vitals/Pain Pain Assessment: No/denies pain    Home Living Family/patient expects to be discharged to:: Private residence Living Arrangements: Alone Available Help at Discharge: Family;Available PRN/intermittently Type of Home: Apartment Home Access: Elevator     Home Layout: One level Home Equipment: Walker - 4 wheels;Walker - 2 wheels;Cane - single point;Wheelchair - Education administrator (comment);Cane - quad;Shower seat;Grab bars - toilet;Grab bars - tub/shower      Prior Function Level of Independence: Independent with assistive device(s);Needs assistance   Gait / Transfers Assistance Needed: used wheelchair indoors and rollator outdoors per pt  ADL's / Homemaking Assistance Needed: pt does own ADLs        Hand Dominance   Dominant Hand: Right    Extremity/Trunk Assessment   Upper Extremity Assessment Upper Extremity Assessment: Defer to OT evaluation    Lower Extremity Assessment Lower Extremity Assessment: Generalized weakness    Cervical / Trunk Assessment Cervical / Trunk Assessment: Normal  Communication   Communication: No difficulties  Cognition Arousal/Alertness: Awake/alert Behavior During Therapy: WFL for tasks assessed/performed Overall Cognitive Status: Within Functional Limits for tasks assessed  General Comments      Exercises General Exercises - Lower Extremity Ankle Circles/Pumps: AROM;Both;10 reps;Supine Long Arc Quad: AROM;Both;10 reps;Seated   Assessment/Plan    PT Assessment Patient needs continued PT services  PT Problem List Decreased balance;Decreased mobility;Decreased knowledge of use of DME;Decreased safety awareness;Decreased knowledge of precautions       PT Treatment Interventions DME instruction;Gait training;Functional  mobility training;Therapeutic activities;Therapeutic exercise;Balance training;Patient/family education    PT Goals (Current goals can be found in the Care Plan section)  Acute Rehab PT Goals Patient Stated Goal: to go home PT Goal Formulation: With patient Time For Goal Achievement: 07/31/17 Potential to Achieve Goals: Good    Frequency Min 3X/week   Barriers to discharge        Co-evaluation               AM-PAC PT "6 Clicks" Daily Activity  Outcome Measure Difficulty turning over in bed (including adjusting bedclothes, sheets and blankets)?: None Difficulty moving from lying on back to sitting on the side of the bed? : None Difficulty sitting down on and standing up from a chair with arms (e.g., wheelchair, bedside commode, etc,.)?: A Little Help needed moving to and from a bed to chair (including a wheelchair)?: A Little Help needed walking in hospital room?: Total Help needed climbing 3-5 steps with a railing? : Total 6 Click Score: 16    End of Session Equipment Utilized During Treatment: Gait belt Activity Tolerance: Patient limited by fatigue Patient left: in chair;with call bell/phone within reach;with chair alarm set Nurse Communication: Mobility status PT Visit Diagnosis: Unsteadiness on feet (R26.81);Muscle weakness (generalized) (M62.81)    Time: 1100-1109 PT Time Calculation (min) (ACUTE ONLY): 9 min   Charges:   PT Evaluation $PT Eval Moderate Complexity: 1 Mod     PT G Codes:        Jahnae Mcadoo,PT Acute Rehabilitation [phone] (pager)   Denice Paradise 07/17/2017, 1:55 PM

## 2017-07-17 NOTE — Discharge Summary (Signed)
Name: [name] MRN: [id number] DOB: 1946-02-07 72 y.o. PCP: Zada Finders, MD  Date of Admission: 07/14/2017 12:15 PM Date of Discharge:  Attending Physician: Annia Belt, MD  Discharge Diagnosis: Principal Problem:   COPD with acute exacerbation ([place]) Active Problems:   Chronic combined systolic and diastolic congestive heart failure (HCC)   Severe malnutrition ([place])   Longstanding persistent atrial fibrillation ([place])   Long term current use of opiate analgesic   CKD (chronic kidney disease) stage 3, GFR 30-59 ml/min (HCC)   Dehydration   Orthostatic dizziness   Pressure injury of skin   Acute kidney injury ([place])   Paroxysmal atrial fibrillation ([place])   Palliative care encounter   DNR (do not resuscitate)   Discharge Medications: Allergies as of 07/18/2017      Reactions   Amiodarone Other (See Comments)   Thyroiditis in the setting of amiodarone use   Penicillins Rash, Other (See Comments)   Has patient had a PCN reaction causing immediate rash, facial/tongue/throat swelling, SOB or lightheadedness with hypotension: Yes Has patient had a PCN reaction causing severe rash involving mucus membranes or skin necrosis: No Has patient had a PCN reaction that required hospitalization No Has patient had a PCN reaction occurring within the last 10 years: No If all of the above answers are "NO", then may proceed with Cephalosporin use.      Medication List    TAKE these medications   albuterol 108 (90 Base) MCG/ACT inhaler Commonly known as:  VENTOLIN HFA Inhale 2 puffs into the lungs every 6 (six) hours as needed for wheezing or shortness of breath.   apixaban 2.5 MG Tabs tablet Commonly known as:  ELIQUIS Take 1 tablet (2.5 mg total) by mouth 2 (two) times daily.   aspirin 81 MG chewable tablet Chew 1 tablet (81 mg total) by mouth daily.   azithromycin 250 MG tablet Commonly known as:  ZITHROMAX Take 1 tablet tomorrow on 07/19/2017. Start taking on:   07/19/2017   buPROPion 100 MG 12 hr tablet Commonly known as:  WELLBUTRIN SR Take 1 tablet (100 mg total) by mouth 2 (two) times daily.   diltiazem 120 MG 24 hr capsule Commonly known as:  CARDIZEM CD Take 1 capsule (120 mg total) by mouth daily. Start taking on:  07/19/2017   dronabinol 10 MG capsule Commonly known as:  MARINOL Take 10 mg by mouth 2 (two) times daily before a meal.   feeding supplement Liqd Take 1 Container by mouth 3 (three) times daily between meals.   metoprolol succinate 50 MG 24 hr tablet Commonly known as:  TOPROL-XL Take 1 tablet (50 mg total) by mouth every 12 (twelve) hours. Take with or immediately following a meal.   oxyCODONE-acetaminophen 10-325 MG tablet Commonly known as:  PERCOCET Take 1 tablet by mouth every 4 (four) hours as needed for pain. Do not take more than 5 tablets in 24 hours.   polyethylene glycol packet Commonly known as:  MIRALAX / GLYCOLAX Take 17 g by mouth daily as needed.   predniSONE 10 MG tablet Commonly known as:  DELTASONE Take 1 tablet (10 mg total) by mouth daily with breakfast for 10 days.   predniSONE 20 MG tablet Commonly known as:  DELTASONE Take 1 tablet (20 mg total) by mouth daily with breakfast for 10 days. Start taking on:  07/19/2017   rosuvastatin 20 MG tablet Commonly known as:  CRESTOR TAKE 1 TABLET EVERY DAY AT 6 PM   senna-docusate 8.6-50  MG tablet Commonly known as:  Senokot-S Take 2 tablets by mouth 2 (two) times daily. What changed:    how much to take  when to take this   tiotropium 18 MCG inhalation capsule Commonly known as:  SPIRIVA Place 1 capsule (18 mcg total) into inhaler and inhale daily. What changed:  Another medication with the same name was added. Make sure you understand how and when to take each.   tiotropium 18 MCG inhalation capsule Commonly known as:  SPIRIVA Place 1 capsule (18 mcg total) into inhaler and inhale daily. Start taking on:  07/19/2017 What changed:  You were  already taking a medication with the same name, and this prescription was added. Make sure you understand how and when to take each.       Disposition and follow-up:   [name] was discharged from [facility] in Stable condition.  At the hospital follow up visit please address:  1.  --Assess respiratory status and stabilization from COPD exacerbation  --Finish 5 day courses of azithro and prednisone --Continue ongoing discussions regarding goals of care  2.  Labs / imaging needed at time of follow-up: None  3.  Pending labs/ test needing follow-up: None   Follow-up Appointments: Follow-up Information    [place]. Go on 07/25/2017.   Why:  Please attend your hospital follow up appointment at 10:15 AM on Friday, July 25, 2016. Contact information: [address] [place], [facility].   Specialty:  Home Health Services Why:  HHPT Contact information: [place] [address] [phone]           Hospital Course by problem list:   COPD Exacerbation Presented with increased cough and sputum production compared to baseline, as well as an increased oxygen requirement (wears 3 L O2 at home when needed) consistent with a COPD exacerbation.  Chest x-ray did not have evidence of pneumonia, afebrile, no leukocytosis.  He was started on prednisone 40 mg and azithromycin, treated with PRN duo nebs, and supplemental oxygen.  He was weaned to room air and maintaining good oxygen saturation and improved as expected.  He was discharged with prescriptions for azithromycin and prednisone to finish 5-day courses for appropriate treatment.   Viral Illness Patient complaint of generalized fatigue, acute on chronic loss of appetite, diarrhea which may represent a viral gastroenteritis or other illness as an underlying cause contributing to his COPD  exacerbation.  He improved with IV fluids and treatment of his COPD as above.  Goals of Care Given the advanced nature of his COPD and overall decline with his weight loss, palliative care was consulted for goals of care discussion.  These topics have been previously introduced on prior admissions as well as by his PCP as an outpatient.  However, he did not not recall these discussions.  His status was changed from full code to DNR appropriately, and that he initiated advanced directives paperwork.   Acute on Chronic Kidney Disease (CKD III), Dehydration Creatinine on presentation 2.06, compared to recent baseline values of 1.3-1.5.  This is likely due to decreased p.o. intake with diarrhea and baseline poor appetite.  He was given gentle IV hydration with subsequent improvement in his creatinine to 1.18, IV fluids discontinued with improved intake. Cr on discharge stable at 1.17.   Atrial Fibrillation Patient presented in RVR with rates to 140s in  the setting of missed medication doses, also with acute illness likely contributing.  He is on home diltiazem, metoprolol, and Eliquis.  His home medications were continued with improvement in his rate.  His diltiazem was temporarily held for low blood pressure (see below).  No changes were made to his regimen upon discharge.  Chronic Hypotension Patient has a history of chronic hypotension with low or low-normal blood pressures which was again observed during admission.  His home diltiazem was held for low blood pressure and resumed without subsequent drop.  He remained asymptomatic during these periods.  Malnutrition Patient underweight with BMI 16.37 with Sarka pia, minimal muscle mass.  On home nutritional supplement as well as Marinol.  Lack of muscle mass may underestimate the degree of renal dysfunction based on his creatinine.  Other options for appetite stimulants can be considered on outpatient follow-up.  H/o Combined Systolic and Diastolic HF   EF of 91% on most recent echo.  Appeared dehydrated on exam, fluid status watch closely with gentle hydration, no signs of volume overload or concern for exacerbation of heart failure during admission.  Discharge Vitals:   BP 108/62   Pulse 68   Temp 98 F (36.7 C) (Oral)   Resp 14   Ht 6\' 3"  (1.905 m)   Wt 126 lb 5.2 oz (57.3 kg)   SpO2 99%   BMI 15.79 kg/m   Pertinent Labs, Studies, and Procedures:  BMP Latest Ref Rng & Units 07/18/2017 07/17/2017 07/16/2017  Glucose 65 - 99 mg/dL 120(H) 112(H) 133(H)  BUN 6 - 20 mg/dL 23(H) 25(H) 28(H)  Creatinine 0.61 - 1.24 mg/dL 1.17 1.18 1.31(H)  BUN/Creat Ratio 10 - 22 - - -  Sodium 135 - 145 mmol/L 139 137 138  Potassium 3.5 - 5.1 mmol/L 4.5 4.7 4.7  Chloride 101 - 111 mmol/L 113(H) 112(H) 112(H)  CO2 22 - 32 mmol/L 19(L) 19(L) 19(L)  Calcium 8.9 - 10.3 mg/dL 8.5(L) 8.3(L) 8.3(L)     Discharge Instructions: Discharge Instructions    Call MD for:  difficulty breathing, headache or visual disturbances   Complete by:  As directed    Call MD for:  extreme fatigue   Complete by:  As directed    Call MD for:  persistant dizziness or light-headedness   Complete by:  As directed    Call MD for:  temperature >100.4   Complete by:  As directed    Diet - low sodium heart healthy   Complete by:  As directed    Discharge instructions   Complete by:  As directed    [name], [name] were hospitalized for a COPD exacerbation. You improved with steroids, antibiotics, and IV fluids.   I have given you prescription for the antibiotic Azithromycin. Please take 1 tablet tomorrow. After that dose you will have completed your antibiotic course.   I have also written you a prescription for Prednisone. Please take 20 mg of prednisone daily with breakfast for 10 days. On the eleventh day only take half the dose, which is 10 mg of prednisone daily for an additional 10 days.   I have refilled your Diltiazem and Spiriva capsules.   The prescriptions  will be at your pharmacy.   Please follow up in the [facility] on March 8th at 10:15 AM.   Increase activity slowly   Complete by:  As directed       Signed: Melanee Spry, MD 07/18/2017, 2:12 PM   Pager: [phone]

## 2017-07-17 NOTE — Progress Notes (Signed)
   Subjective: No acute events overnight. He feels improved overall and is currently on room air. He states he felt "lousy" this morning but has improved after breakfast and his pain medication. He is willing to work with PT today. Met with palliative care yesterday and working on advanced directive forms. He also reports improved appetite which he attributes to the prednisone. His HR is more elevated this morning, tele review notes periods of poor rate control after holding Dilt yesterday for low blood pressures.  Objective:  Vital signs in last 24 hours: Vitals:   07/16/17 1555 07/16/17 2126 07/17/17 0522 07/17/17 0935  BP: (!) 132/97 98/68 113/70   Pulse: (!) 54  75   Resp: 13 16    Temp: (!) 97.4 F (36.3 C) 98.3 F (36.8 C) 98 F (36.7 C)   TempSrc: Oral Oral Oral   SpO2: 99% 99% 100% 98%  Weight:      Height:       General: Frail, chronically ill appearing gentleman resting in bed, no acute distress HEENT: Normal conjuctiva, no JVD  CV: Irregular rhythm, regular rate  Resp: No wheezing, stable diminished and coarse breath sounds, normal work of breathing, no distress  Abd: Soft, +BS, improved tenderness to palpation to lower abd   Extr: No LE edema, sarcopenia  Neuro: Alert and oriented x3  Skin: Warm, dry      Assessment/Plan:  COPD Exacerbation  Pt presenting with increased oxygen requirement, increased cough and sputum production consistent with COPD exacerbation in the setting of generalized fatigue, loss of appetite which may represent a viral illness as a cause for this exacerbation. His CXR did not have evidence of focal consolidation or pneumonia. He has steadily improved with prednisone and azithromycin. Given his overall decline and COPD, palliative care consulted for goals of care discussion, appreciate their assistance. He has been made DNR/DNI while continuing full scope of treatment and is filling out advanced directives.   --Monitor vital signs --O2 goal  88-92% --Cont Prednisone 40 mg  --Cont Azithromycin  --Duoneb q6hr prn --Work with PT today  --Cont home maintenance inhaler equivalents  Acute on Chronic Kidney Disease (CKD III), Dehydration Cr on presentation 2.06, baseline ~1.5. Likely due to decreased PO intake with diarrhea. Has improved with IV hydration and at baseline, currently 1.18.  --DC IVF with improved intake  --BMP, avoid nephrotoxins   Atrial Fibrillation On home diltiazem, metoprolol, and eliquis with periods of RVR on admission likely due to missed medication doses and acute illness. Had improved but, with soft blood pressures, diltiazem held yesterday and had periods of greater tachycardia. Diltiazem resumed this morning and will monitor response and adjust meds as indicated.  --Cont home metoprolol, eliquis --Resume home diltiazem and monitor   Dispo: Anticipated discharge in approximately 1 day(s)  Tawny Asal, MD 07/17/2017, 11:30 AM Pager: [phone]

## 2017-07-17 NOTE — Progress Notes (Signed)
  Date: 07/17/2017  Patient name: [name]  Medical record number: [id number]  Date of birth: May 09, 1946   I have seen and evaluated this patient and I have discussed the plan of care with the house staff. Please see their note for complete details. I concur with their findings with the following additions/corrections:   Mr. Basu is doing a little bit better today and feels like he has made a little bit of progress. His heart rate was increased overnight last night, and we restarted his diltiazem this morning, with improved heart rate control. His breathing is better at rest, but he does still get winded with movement. PT worked with him today and got him out of bed to the chair and back. He is hopeful to go home tomorrow with home health services. I appreciate palliative meeting with him and revisiting his goals of care. His weight loss and cachexia indicate that his COPD is becoming end-stage and his prognosis is likely limited.  Lenice Pressman, M.D., Ph.D. 07/17/2017, 5:13 PM

## 2017-07-17 NOTE — Progress Notes (Signed)
Responded to [facility] to assist patient with AD.  Patient needed more time to complete.  Will have nurse page Chaplain when ready. Possible d/c tomorrow.  Chaplain will follow as needed.  Jaclynn Major, [place], [facility], Pager [phone]

## 2017-07-18 DIAGNOSIS — E46 Unspecified protein-calorie malnutrition: Secondary | ICD-10-CM

## 2017-07-18 DIAGNOSIS — Z681 Body mass index (BMI) 19 or less, adult: Secondary | ICD-10-CM

## 2017-07-18 DIAGNOSIS — Z86718 Personal history of other venous thrombosis and embolism: Secondary | ICD-10-CM

## 2017-07-18 DIAGNOSIS — Z880 Allergy status to penicillin: Secondary | ICD-10-CM

## 2017-07-18 DIAGNOSIS — Z888 Allergy status to other drugs, medicaments and biological substances status: Secondary | ICD-10-CM

## 2017-07-18 DIAGNOSIS — I9589 Other hypotension: Secondary | ICD-10-CM

## 2017-07-18 DIAGNOSIS — Z9981 Dependence on supplemental oxygen: Secondary | ICD-10-CM

## 2017-07-18 DIAGNOSIS — I130 Hypertensive heart and chronic kidney disease with heart failure and stage 1 through stage 4 chronic kidney disease, or unspecified chronic kidney disease: Secondary | ICD-10-CM

## 2017-07-18 DIAGNOSIS — I5040 Unspecified combined systolic (congestive) and diastolic (congestive) heart failure: Secondary | ICD-10-CM

## 2017-07-18 DIAGNOSIS — B349 Viral infection, unspecified: Secondary | ICD-10-CM

## 2017-07-18 LAB — BASIC METABOLIC PANEL
Anion gap: 7 (ref 5–15)
BUN: 23 mg/dL — ABNORMAL HIGH (ref 6–20)
CO2: 19 mmol/L — ABNORMAL LOW (ref 22–32)
Calcium: 8.5 mg/dL — ABNORMAL LOW (ref 8.9–10.3)
Chloride: 113 mmol/L — ABNORMAL HIGH (ref 101–111)
Creatinine, Ser: 1.17 mg/dL (ref 0.61–1.24)
GFR calc Af Amer: >60 mL/min (ref >60)
GFR calc non Af Amer: >60 mL/min (ref >60)
Glucose, Bld: 120 mg/dL — ABNORMAL HIGH (ref 65–99)
Potassium: 4.5 mmol/L (ref 3.5–5.1)
Sodium: 139 mmol/L (ref 135–145)

## 2017-07-18 LAB — URINE CULTURE

## 2017-07-18 MED ORDER — PREDNISONE 20 MG PO TABS: 20.0000 mg | ORAL_TABLET | Freq: Every day | ORAL | 0 refills | Status: AC

## 2017-07-18 MED ORDER — DILTIAZEM HCL ER COATED BEADS 120 MG PO CP24: 120.0000 mg | ORAL_CAPSULE | Freq: Every day | ORAL | 0 refills | Status: DC

## 2017-07-18 MED ORDER — PREDNISONE 10 MG PO TABS: 10.0000 mg | ORAL_TABLET | Freq: Every day | ORAL | 0 refills | Status: AC

## 2017-07-18 MED ORDER — AZITHROMYCIN 250 MG PO TABS: ORAL_TABLET | ORAL | 0 refills | Status: DC

## 2017-07-18 MED ORDER — TIOTROPIUM BROMIDE MONOHYDRATE 18 MCG IN CAPS: 18.0000 ug | ORAL_CAPSULE | Freq: Every day | RESPIRATORY_TRACT | 0 refills | Status: DC

## 2017-07-18 MED ORDER — PREDNISONE 20 MG PO TABS: 20.0000 mg | ORAL_TABLET | Freq: Every day | ORAL | 0 refills | Status: DC

## 2017-07-18 NOTE — Care Management Note (Signed)
Case Management Note  Patient Details  Name: [name] MRN: [id number] Date of Birth: May 09, 1946  Subjective/Objective:  From home alone, w/chair bound, gets meals on wheels on home oxygen 3 liters prn with [facility],  presents with copd ex. And afib with rvr, per pt eval rec HHPT, patient chose [facility] , referral given to [facility] with [facility], soc will begin 24-48 hrs post dc. He states he has transportation home.  He states to [facility] and Ambulance person  he does not need oxygen to go home with because he is not on it continuously.                  Action/Plan: DC home with HHPT with [facility].  Expected Discharge Date:  07/18/17               Expected Discharge Plan:  [place]  In-House Referral:     Discharge planning Services  CM Consult  Post Acute Care Choice:  Home Health Choice offered to:  Patient  DME Arranged:    DME Agency:     HH Arranged:  PT [place]:  [place]  Status of Service:  Completed, signed off  If discussed at [place] of Stay Meetings, dates discussed:    Additional Comments:  Zenon Mayo, RN 07/18/2017, 12:35 PM

## 2017-07-18 NOTE — Progress Notes (Signed)
   Subjective:  No acute events overnight, and no acute complaints. Patient states his breathing is back to his baseline, and feels much improved from the day of admission. States his appetite has improved and attributes it to the prednisone. Patient is requesting prednisone for appetite stimulation. Overall, feels stable for discharge.   Objective:  Vital signs in last 24 hours: Vitals:   07/17/17 2144 07/18/17 0111 07/18/17 0411 07/18/17 0835  BP: 107/60 134/62 108/62   Pulse: 77 72 68   Resp:  16 14   Temp: 97.9 F (36.6 C) 98.2 F (36.8 C) 98 F (36.7 C)   TempSrc: Oral Oral Oral   SpO2: 98% 98% 99% 99%  Weight:      Height:       General: Laying in bed comfortably, NAD HEENT: [place]/AT, no scleral icterus Cardiac: RRR, No R/M/G appreciated Pulm: normal effort, CTAB, no wheezes appreciated Abd: soft, non tender, non distended, BS normal Ext: extremities well perfused, no peripheral edema Neuro: alert and oriented X3, cranial nerves II-XII grossly intact    Assessment/Plan:  COPD Exacerbation  Pt has improved with prednisone (day 5) and azithromycin (day 4). Patient is at his baseline and is using 3 L O2 PRN, which is his home oxygen regimen. Vital signs remained stable overnight.  -Monitor vital signs; O2 goal 88-92% -Cont Prednisone 40 mg - last dose today; will continue lower dose prednisone 20 mg for 10 days then taper to 10 mg for 10 days at discharge.  -Cont Azithromycin; will be discharged with one day rx to complete 5 day course -Duoneb q6hr prn -Cont home maintenance inhaler equivalents  Acute on Chronic Kidney Disease (CKD III), Dehydration Cr on presentation 2.06, baseline ~1.5. Likely due to decreased PO intake with diarrhea. Has improved with IV hydration and at baseline, currently 1.17.   Atrial Fibrillation On home diltiazem, metoprolol, and eliquis with periods of RVR on admission likely due to missed medication doses and acute illness. Had improved but,  with soft blood pressures, diltiazem held yesterday and had periods of greater tachycardia. Diltiazem resumed yesterday. Blood pressure and heart rates have been stable. -Cont home metoprolol, eliquis, and diltiazem  Dispo: Anticipated discharge today.   Melanee Spry, MD 07/18/2017, 11:54 AM Pager: [phone]

## 2017-07-21 ENCOUNTER — Inpatient Hospital Stay (HOSPITAL_COMMUNITY)
Admission: EM | Admit: 2017-07-21 | Discharge: 2017-07-27 | DRG: 391 | Disposition: A | Discharge status: Home Health | Payer: Medicare Other | Attending: Internal Medicine | Admitting: Internal Medicine

## 2017-07-21 ENCOUNTER — Other Ambulatory Visit: Payer: Self-pay

## 2017-07-21 ENCOUNTER — Inpatient Hospital Stay (HOSPITAL_COMMUNITY): Payer: Medicare Other

## 2017-07-21 ENCOUNTER — Encounter (HOSPITAL_COMMUNITY): Payer: Self-pay

## 2017-07-21 DIAGNOSIS — K5289 Other specified noninfective gastroenteritis and colitis: Principal | ICD-10-CM | POA: Diagnosis present

## 2017-07-21 DIAGNOSIS — I429 Cardiomyopathy, unspecified: Secondary | ICD-10-CM | POA: Diagnosis present

## 2017-07-21 DIAGNOSIS — Z7901 Long term (current) use of anticoagulants: Secondary | ICD-10-CM

## 2017-07-21 DIAGNOSIS — Z79891 Long term (current) use of opiate analgesic: Secondary | ICD-10-CM

## 2017-07-21 DIAGNOSIS — R945 Abnormal results of liver function studies: Secondary | ICD-10-CM | POA: Diagnosis present

## 2017-07-21 DIAGNOSIS — I252 Old myocardial infarction: Secondary | ICD-10-CM

## 2017-07-21 DIAGNOSIS — D72829 Elevated white blood cell count, unspecified: Secondary | ICD-10-CM | POA: Diagnosis present

## 2017-07-21 DIAGNOSIS — Z86718 Personal history of other venous thrombosis and embolism: Secondary | ICD-10-CM

## 2017-07-21 DIAGNOSIS — N183 Chronic kidney disease, stage 3 (moderate): Secondary | ICD-10-CM | POA: Diagnosis present

## 2017-07-21 DIAGNOSIS — K5900 Constipation, unspecified: Secondary | ICD-10-CM | POA: Diagnosis not present

## 2017-07-21 DIAGNOSIS — I482 Chronic atrial fibrillation: Secondary | ICD-10-CM | POA: Diagnosis present

## 2017-07-21 DIAGNOSIS — F319 Bipolar disorder, unspecified: Secondary | ICD-10-CM | POA: Diagnosis present

## 2017-07-21 DIAGNOSIS — I130 Hypertensive heart and chronic kidney disease with heart failure and stage 1 through stage 4 chronic kidney disease, or unspecified chronic kidney disease: Secondary | ICD-10-CM | POA: Diagnosis present

## 2017-07-21 DIAGNOSIS — R52 Pain, unspecified: Secondary | ICD-10-CM

## 2017-07-21 DIAGNOSIS — K5641 Fecal impaction: Secondary | ICD-10-CM | POA: Diagnosis present

## 2017-07-21 DIAGNOSIS — K5939 Other megacolon: Secondary | ICD-10-CM | POA: Diagnosis not present

## 2017-07-21 DIAGNOSIS — R740 Nonspecific elevation of levels of transaminase and lactic acid dehydrogenase [LDH]: Secondary | ICD-10-CM | POA: Diagnosis not present

## 2017-07-21 DIAGNOSIS — K5010 Crohn's disease of large intestine without complications: Secondary | ICD-10-CM | POA: Diagnosis present

## 2017-07-21 DIAGNOSIS — Z823 Family history of stroke: Secondary | ICD-10-CM

## 2017-07-21 DIAGNOSIS — Z96643 Presence of artificial hip joint, bilateral: Secondary | ICD-10-CM | POA: Diagnosis present

## 2017-07-21 DIAGNOSIS — I2510 Atherosclerotic heart disease of native coronary artery without angina pectoris: Secondary | ICD-10-CM | POA: Diagnosis present

## 2017-07-21 DIAGNOSIS — Z66 Do not resuscitate: Secondary | ICD-10-CM | POA: Diagnosis present

## 2017-07-21 DIAGNOSIS — K567 Ileus, unspecified: Secondary | ICD-10-CM

## 2017-07-21 DIAGNOSIS — N179 Acute kidney failure, unspecified: Secondary | ICD-10-CM | POA: Diagnosis present

## 2017-07-21 DIAGNOSIS — G8929 Other chronic pain: Secondary | ICD-10-CM | POA: Diagnosis present

## 2017-07-21 DIAGNOSIS — I5042 Chronic combined systolic (congestive) and diastolic (congestive) heart failure: Secondary | ICD-10-CM | POA: Diagnosis present

## 2017-07-21 DIAGNOSIS — F3170 Bipolar disorder, currently in remission, most recent episode unspecified: Secondary | ICD-10-CM | POA: Diagnosis not present

## 2017-07-21 DIAGNOSIS — E43 Unspecified severe protein-calorie malnutrition: Secondary | ICD-10-CM | POA: Diagnosis present

## 2017-07-21 DIAGNOSIS — Z7982 Long term (current) use of aspirin: Secondary | ICD-10-CM

## 2017-07-21 DIAGNOSIS — Z9981 Dependence on supplemental oxygen: Secondary | ICD-10-CM | POA: Diagnosis not present

## 2017-07-21 DIAGNOSIS — I739 Peripheral vascular disease, unspecified: Secondary | ICD-10-CM | POA: Diagnosis present

## 2017-07-21 DIAGNOSIS — F339 Major depressive disorder, recurrent, unspecified: Secondary | ICD-10-CM | POA: Diagnosis not present

## 2017-07-21 DIAGNOSIS — I4891 Unspecified atrial fibrillation: Secondary | ICD-10-CM

## 2017-07-21 DIAGNOSIS — J449 Chronic obstructive pulmonary disease, unspecified: Secondary | ICD-10-CM | POA: Diagnosis present

## 2017-07-21 DIAGNOSIS — J9610 Chronic respiratory failure, unspecified whether with hypoxia or hypercapnia: Secondary | ICD-10-CM | POA: Diagnosis present

## 2017-07-21 DIAGNOSIS — E860 Dehydration: Secondary | ICD-10-CM | POA: Diagnosis present

## 2017-07-21 DIAGNOSIS — Z681 Body mass index (BMI) 19 or less, adult: Secondary | ICD-10-CM

## 2017-07-21 DIAGNOSIS — F329 Major depressive disorder, single episode, unspecified: Secondary | ICD-10-CM | POA: Diagnosis present

## 2017-07-21 DIAGNOSIS — R112 Nausea with vomiting, unspecified: Secondary | ICD-10-CM

## 2017-07-21 DIAGNOSIS — Z95810 Presence of automatic (implantable) cardiac defibrillator: Secondary | ICD-10-CM

## 2017-07-21 DIAGNOSIS — Z825 Family history of asthma and other chronic lower respiratory diseases: Secondary | ICD-10-CM

## 2017-07-21 DIAGNOSIS — K5909 Other constipation: Secondary | ICD-10-CM | POA: Diagnosis present

## 2017-07-21 DIAGNOSIS — E875 Hyperkalemia: Secondary | ICD-10-CM | POA: Diagnosis present

## 2017-07-21 DIAGNOSIS — Z809 Family history of malignant neoplasm, unspecified: Secondary | ICD-10-CM

## 2017-07-21 DIAGNOSIS — R197 Diarrhea, unspecified: Secondary | ICD-10-CM

## 2017-07-21 DIAGNOSIS — R140 Abdominal distension (gaseous): Secondary | ICD-10-CM | POA: Diagnosis not present

## 2017-07-21 DIAGNOSIS — Z8249 Family history of ischemic heart disease and other diseases of the circulatory system: Secondary | ICD-10-CM

## 2017-07-21 DIAGNOSIS — E46 Unspecified protein-calorie malnutrition: Secondary | ICD-10-CM | POA: Diagnosis not present

## 2017-07-21 DIAGNOSIS — E785 Hyperlipidemia, unspecified: Secondary | ICD-10-CM | POA: Diagnosis present

## 2017-07-21 DIAGNOSIS — R1110 Vomiting, unspecified: Secondary | ICD-10-CM | POA: Diagnosis not present

## 2017-07-21 LAB — CBC WITH DIFFERENTIAL/PLATELET
BASOS PCT: 0 %
Basophils Absolute: 0 10*3/uL (ref 0.0–0.1)
EOS ABS: 0.2 10*3/uL (ref 0.0–0.7)
Eosinophils Relative: 1 %
HEMATOCRIT: 52.2 % — AB (ref 39.0–52.0)
Hemoglobin: 17.9 g/dL — ABNORMAL HIGH (ref 13.0–17.0)
Lymphocytes Relative: 19 %
Lymphs Abs: 3.6 10*3/uL (ref 0.7–4.0)
MCH: 31.8 pg (ref 26.0–34.0)
MCHC: 34.3 g/dL (ref 30.0–36.0)
MCV: 92.7 fL (ref 78.0–100.0)
MONO ABS: 1.8 10*3/uL — AB (ref 0.1–1.0)
MONOS PCT: 9 %
Neutro Abs: 13.4 10*3/uL — ABNORMAL HIGH (ref 1.7–7.7)
Neutrophils Relative %: 71 %
Platelets: 180 10*3/uL (ref 150–400)
RBC: 5.63 MIL/uL (ref 4.22–5.81)
RDW: 15.5 % (ref 11.5–15.5)
WBC: 18.9 10*3/uL — ABNORMAL HIGH (ref 4.0–10.5)

## 2017-07-21 LAB — COMPREHENSIVE METABOLIC PANEL
ALBUMIN: 3.6 g/dL (ref 3.5–5.0)
ALT: 153 U/L — ABNORMAL HIGH (ref 17–63)
ANION GAP: 13 (ref 5–15)
AST: 107 U/L — AB (ref 15–41)
Alkaline Phosphatase: 56 U/L (ref 38–126)
BILIRUBIN TOTAL: 0.8 mg/dL (ref 0.3–1.2)
BUN: 36 mg/dL — AB (ref 6–20)
CALCIUM: 9.4 mg/dL (ref 8.9–10.3)
CHLORIDE: 108 mmol/L (ref 101–111)
CO2: 23 mmol/L (ref 22–32)
Creatinine, Ser: 1.57 mg/dL — ABNORMAL HIGH (ref 0.61–1.24)
GFR calc Af Amer: 49 mL/min — ABNORMAL LOW (ref >60)
GFR calc non Af Amer: 42 mL/min — ABNORMAL LOW (ref >60)
GLUCOSE: 120 mg/dL — AB (ref 65–99)
POTASSIUM: 4.8 mmol/L (ref 3.5–5.1)
Sodium: 144 mmol/L (ref 135–145)
TOTAL PROTEIN: 6.4 g/dL — AB (ref 6.5–8.1)

## 2017-07-21 LAB — I-STAT CG4 LACTIC ACID, ED: LACTIC ACID, VENOUS: 2.25 mmol/L — AB (ref 0.5–1.9)

## 2017-07-21 LAB — LIPASE, BLOOD: LIPASE: 41 U/L (ref 11–51)

## 2017-07-21 MED ORDER — TIOTROPIUM BROMIDE MONOHYDRATE 18 MCG IN CAPS: 18.0000 ug | ORAL_CAPSULE | Freq: Every day | RESPIRATORY_TRACT | Status: DC

## 2017-07-21 MED ORDER — DILTIAZEM HCL-DEXTROSE 100-5 MG/100ML-% IV SOLN (PREMIX)
5.0000 mg/h | INTRAVENOUS | Status: DC
  Administered 2017-07-21 – 2017-07-22 (×2): 5 mg/h via INTRAVENOUS
  Filled 2017-07-21 (×2): qty 100

## 2017-07-21 MED ORDER — ONDANSETRON HCL 4 MG/2ML IJ SOLN
4.0000 mg | Freq: Once | INTRAMUSCULAR | Status: AC
  Administered 2017-07-21: 4 mg via INTRAVENOUS
  Filled 2017-07-21: qty 2

## 2017-07-21 MED ORDER — DILTIAZEM HCL ER COATED BEADS 120 MG PO CP24: 120.0000 mg | ORAL_CAPSULE | Freq: Every day | ORAL | Status: DC

## 2017-07-21 MED ORDER — METOPROLOL SUCCINATE ER 50 MG PO TB24
50.0000 mg | ORAL_TABLET | Freq: Two times a day (BID) | ORAL | Status: DC
  Administered 2017-07-22 – 2017-07-27 (×11): 50 mg via ORAL
  Filled 2017-07-21 (×11): qty 1

## 2017-07-21 MED ORDER — MILK AND MOLASSES ENEMA
1.0000 | Freq: Once | RECTAL | Status: AC
  Administered 2017-07-21: 250 mL via RECTAL
  Filled 2017-07-21: qty 250

## 2017-07-21 MED ORDER — DILTIAZEM HCL ER COATED BEADS 120 MG PO CP24
120.0000 mg | ORAL_CAPSULE | Freq: Every day | ORAL | Status: DC
  Administered 2017-07-22 – 2017-07-27 (×6): 120 mg via ORAL
  Filled 2017-07-21 (×6): qty 1

## 2017-07-21 MED ORDER — ACETAMINOPHEN 650 MG RE SUPP: 650.0000 mg | Freq: Four times a day (QID) | RECTAL | Status: DC | PRN

## 2017-07-21 MED ORDER — MORPHINE SULFATE (PF) 4 MG/ML IV SOLN: 2.0000 mg | INTRAVENOUS | Status: DC | PRN

## 2017-07-21 MED ORDER — BUPROPION HCL ER (SR) 100 MG PO TB12
100.0000 mg | ORAL_TABLET | Freq: Two times a day (BID) | ORAL | Status: DC
  Administered 2017-07-21 – 2017-07-27 (×12): 100 mg via ORAL
  Filled 2017-07-21 (×12): qty 1

## 2017-07-21 MED ORDER — ACETAMINOPHEN 325 MG PO TABS
650.0000 mg | ORAL_TABLET | Freq: Four times a day (QID) | ORAL | Status: DC | PRN
  Administered 2017-07-22 – 2017-07-23 (×2): 650 mg via ORAL
  Filled 2017-07-21 (×2): qty 2

## 2017-07-21 MED ORDER — OXYCODONE HCL 5 MG PO TABS
5.0000 mg | ORAL_TABLET | ORAL | Status: DC | PRN
  Administered 2017-07-21 – 2017-07-27 (×12): 5 mg via ORAL
  Filled 2017-07-21 (×14): qty 1

## 2017-07-21 MED ORDER — FENTANYL CITRATE (PF) 100 MCG/2ML IJ SOLN
50.0000 ug | Freq: Once | INTRAMUSCULAR | Status: AC
  Administered 2017-07-21: 50 ug via INTRAVENOUS
  Filled 2017-07-21: qty 2

## 2017-07-21 MED ORDER — PREDNISONE 20 MG PO TABS
20.0000 mg | ORAL_TABLET | Freq: Every day | ORAL | Status: DC
  Administered 2017-07-22 – 2017-07-27 (×6): 20 mg via ORAL
  Filled 2017-07-21 (×7): qty 1

## 2017-07-21 MED ORDER — SODIUM CHLORIDE 0.9 % IV BOLUS (SEPSIS)
500.0000 mL | Freq: Once | INTRAVENOUS | Status: AC
  Administered 2017-07-21: 500 mL via INTRAVENOUS

## 2017-07-21 MED ORDER — MAGNESIUM CITRATE PO SOLN
1.0000 | Freq: Once | ORAL | Status: AC
  Administered 2017-07-21: 1 via ORAL
  Filled 2017-07-21: qty 296

## 2017-07-21 MED ORDER — ONDANSETRON HCL 4 MG/2ML IJ SOLN
4.0000 mg | Freq: Four times a day (QID) | INTRAMUSCULAR | Status: DC | PRN
  Administered 2017-07-22 – 2017-07-23 (×2): 4 mg via INTRAVENOUS
  Filled 2017-07-21 (×2): qty 2

## 2017-07-21 MED ORDER — APIXABAN 2.5 MG PO TABS
2.5000 mg | ORAL_TABLET | Freq: Two times a day (BID) | ORAL | Status: DC
  Administered 2017-07-21 – 2017-07-27 (×12): 2.5 mg via ORAL
  Filled 2017-07-21 (×12): qty 1

## 2017-07-21 MED ORDER — SODIUM CHLORIDE 0.9 % IV SOLN
INTRAVENOUS | Status: AC
  Administered 2017-07-21: 20:00:00 via INTRAVENOUS

## 2017-07-21 MED ORDER — METOPROLOL SUCCINATE ER 50 MG PO TB24: 50.0000 mg | ORAL_TABLET | Freq: Two times a day (BID) | ORAL | Status: DC

## 2017-07-21 MED ORDER — IOPAMIDOL (ISOVUE-370) INJECTION 76%
INTRAVENOUS | Status: AC
  Administered 2017-07-21: 100 mL
  Filled 2017-07-21: qty 100

## 2017-07-21 MED ORDER — ASPIRIN 81 MG PO CHEW
81.0000 mg | CHEWABLE_TABLET | Freq: Every day | ORAL | Status: DC
  Administered 2017-07-22 – 2017-07-23 (×2): 81 mg via ORAL
  Filled 2017-07-21 (×2): qty 1

## 2017-07-21 MED ORDER — PREDNISONE 20 MG PO TABS: 10.0000 mg | ORAL_TABLET | Freq: Every day | ORAL | Status: DC

## 2017-07-21 MED ORDER — OXYCODONE-ACETAMINOPHEN 5-325 MG PO TABS
1.0000 | ORAL_TABLET | ORAL | Status: DC | PRN
  Administered 2017-07-21 – 2017-07-27 (×10): 1 via ORAL
  Filled 2017-07-21 (×10): qty 1

## 2017-07-21 MED ORDER — OXYCODONE-ACETAMINOPHEN 10-325 MG PO TABS: 1.0000 | ORAL_TABLET | ORAL | Status: DC | PRN

## 2017-07-21 MED ORDER — DILTIAZEM LOAD VIA INFUSION
10.0000 mg | Freq: Once | INTRAVENOUS | Status: AC
  Administered 2017-07-21: 10 mg via INTRAVENOUS
  Filled 2017-07-21: qty 10

## 2017-07-21 NOTE — ED Provider Notes (Signed)
[place] 2C CV PROGRESSIVE CARE Provider Note   CSN: [id number] Arrival date & time: 07/21/17  1258     History   Chief Complaint No chief complaint on file.   HPI [name] is a 72 y.o. male.  HPI Patient presents with chest pain and abdominal pain.  Has had for the last few days.  States she was recently in the hospital for the same.  States the abdominal pain is severe.  States he has been having diarrhea and nausea.   he does not know if he has been having fevers.  He is no blood in the stool.  No cough.  History of atrial fibrillation and is on eliquis. Chronic afib.  Patient states his heart rates been going fast since Saturday. Past Medical History:  Diagnosis Date  . Alcohol abuse   . Anxiety   . Asbestos exposure   . Atrial fibrillation with RVR ([place]) 01/17/2014  . Avascular necrosis of bones of both hips (HCC)    Status post bilateral hip replacements  . Bipolar disorder ([place])   . Chronic systolic heart failure ([place])    a. NICM - EF 35% with normal cors 2003. b. Last echo 11/2012 - EF 25-30%.  . CKD (chronic kidney disease), stage III ([place])    Renal U/S 12/04/2009 showed no pathological findings. Labs 12/04/2009 include normal ESR, C3, C4; neg ANA; SPEP showed nonspecific increase in the alpha-2 region with no M-spike; UPEP showed no monoclonal free light chains; urine IFE showed polyclonal increase in feree Kappa and/or free Lambda light chains. Baseline Cr reported 1.7-2.5.  [name] COPD (chronic obstructive pulmonary disease) ([place])   . Depression   . DVT (deep venous thrombosis) ([place])    He has hypercoagulability with multiple prior DVTs  . E coli bacteremia   . Erectile dysfunction   . Failure to thrive (0-17) 08/2016   DEHYDRATION  . Gastritis   . GERD (gastroesophageal reflux disease)   . HCAP (healthcare-associated pneumonia) 08/24/2015  . Hydrocele, unspecified   . Hyperlipemia   . Hypertension   . Hyperthyroidism    Likely due to thyroiditis with possible  amiodarone association.  Thyroid scan 08/28/2009 was normal with no focal areas of abnormal increased or decreased activity seen; the uptake of I 131 sodium iodide at 24 hours was 5.7%.  TSH and free T4 normalized by 08/16/2009.  . Intestinal obstruction ([place])   . Lung nodule    Chest CT scan on 12/14/2008 showed a nodular opacity at the left lung base felt to most likely represent scarring.  Follow-up chest CT scan on 06/02/2010 showed parenchymal scarring in the left apex, left  lower lobe, and lingula; some of this scarring at the left base had a nodular appearance, unchanged. Chest 09/2012 - stable.  [name] NICM (nonischemic cardiomyopathy) ([place])   . Noncompliance   . On home oxygen therapy    "3L periodically" (08/24/2015)  . Osteoarthritis   . Permanent atrial fibrillation ([place])    a. failed ablation in 2010 due to inability to access/venous occlusions. b. failed multaq, amiodarone, tikosyn. c. has not been felt to be a good candidate for AVN ablation due to recurrent infections/bacteremia history.  . Pleural plaque    H/o asbestos exposure. Chest CT on 06/02/2010 showed stable extensive calcified pleural plaques involving the left hemithorax, consistent with asbestos related pleural disease.  . Portal vein thrombosis   . Protein calorie malnutrition ([place]) 04/2016  . Spondylosis   . STEMI (ST elevation  myocardial infarction) ([place]) 74/94/4967   a. felt embolic in nature due to noncompliance with anticoagulation, LHC not pursued.  . 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 b. St. Jude gen change 2010.  [name] Thrombocytopenia ([place])   . 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  . Weight loss    Normal colonoscopy by Dr. Olevia Perches on 11/06/2010.    Patient Active Problem List   Diagnosis Date Noted  . Abdominal pain, bilateral lower quadrant 07/21/2017  . Acute  kidney injury ([place])   . Paroxysmal atrial fibrillation (HCC)   . Palliative care encounter   . DNR (do not resuscitate)   . Pressure injury of skin 07/15/2017  . Orthostatic dizziness 06/20/2017  . History of MI (myocardial infarction)   . History of DVT (deep vein thrombosis)   . 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 Thrombocytopenia ([place]) 09/10/2016  . Abdominal aortic atherosclerosis ([place]) 09/05/2016  . Dehydration 04/29/2016  . Protein-calorie malnutrition, severe 02/18/2016  . Chronic hydrocele   . Asbestosis ([place])   . CKD (chronic kidney disease) stage 3, GFR 30-59 ml/min (HCC)   . Coagulopathy ([place])   . Long term current use of opiate analgesic 06/27/2015  . Neuropathic pain of both feet 05/03/2015  . Atrial fibrillation with rapid ventricular response ([place]) 11/09/2014  . Longstanding persistent atrial fibrillation ([place]) 10/11/2014  . Healthcare maintenance 01/25/2014  . BPH (benign prostatic hyperplasia) 01/20/2014  . PVD (peripheral vascular disease) ([place]) 12/10/2013  . Severe malnutrition ([place]) 05/18/2013  . Chronic combined systolic and diastolic congestive heart failure ([place]) 06/20/2010  . H/O Arterial embolism of leg  06/05/2010  . Nonischemic cardiomyopathy ([place]) 06/01/2010  . Major depressive disorder, recurrent episode ([place]) 01/19/2010  . History of hyperthyroidism 03/13/2009  . Bilateral hip pain 03/23/2007  . OSTEOARTHRITIS 09/05/2006  . COPD with acute exacerbation ([place]) 08/28/2006  . History of tobacco abuse 03/01/2006  . Essential hypertension 03/01/2006  . Cardiac pacemaker in situ 03/01/2006    Past Surgical History:  Procedure Laterality Date  . CARDIAC CATHETERIZATION  2003   Nl cors, EF 35%  . CARDIOVERSION N/A 05/27/2013   Procedure: CARDIOVERSION;  Surgeon: Candee Furbish, MD;  Location[facility] ENDOSCOPY;  Service: Cardiovascular;  Laterality: N/A;  . EMBOLECTOMY Right 12/10/2013   Procedure: Right Femoral Embolectomy; Fasciotomy  Right Lower Leg with Intraoperative arteriogram;  Surgeon: Rosetta Posner, MD;  Location: [place];  Service: Vascular;  Laterality: Right;  . EMBOLECTOMY Left 10/12/2014   Procedure: Left Femoral Thrombectomy with intraoperative arteriogram;  Surgeon: Rosetta Posner, MD;  Location: [place];  Service: Vascular;  Laterality: Left;  . FASCIOTOMY CLOSURE Right 12/15/2013   Procedure: LEG FASCIOTOMY CLOSURE;  Surgeon: Rosetta Posner, MD;  Location: [place];  Service: Vascular;  Laterality: Right;  . HEMIARTHROPLASTY HIP Left 09/09/2002   bipolar; Dr. Lafayette Dragon  . INSERT / REPLACE / REMOVE PACEMAKER  02/2003   Dr. Roe Rutherford, [employer] Medical pulse generator identity SR model [id number] serial [id number]; gen change by Greggory Brandy  . INSERT / REPLACE / REMOVE PACEMAKER  06/17/2008   Lead and generator change w/ St. [employer] Medical Tendril ST model [id number] (serial  [id number]).       . INTRAOPERATIVE ARTERIOGRAM Right 12/10/2013   Procedure: INTRA OPERATIVE ARTERIOGRAM;  Surgeon: Rosetta Posner, MD;  Location: [place];  Service: Vascular;  Laterality:  Right;  [name] JOINT REPLACEMENT    . TEE WITHOUT CARDIOVERSION N/A 05/27/2013   Procedure: TRANSESOPHAGEAL ECHOCARDIOGRAM (TEE);  Surgeon: Candee Furbish, MD;  [facility]COPY;  Service: Cardiovascular;  Laterality: N/A;  . TOTAL HIP ARTHROPLASTY Right 03/08/2008    By Dr. Hiram Comber       Home Medications    Prior to Admission medications   Medication Sig Start Date End Date Taking? Authorizing Provider  albuterol (VENTOLIN HFA) 108 (90 Base) MCG/ACT inhaler Inhale 2 puffs into the lungs every 6 (six) hours as needed for wheezing or shortness of breath. 05/29/17  Yes Zada Finders, MD  apixaban (ELIQUIS) 2.5 MG TABS tablet Take 1 tablet (2.5 mg total) by mouth 2 (two) times daily. 06/19/17  Yes Alphonzo Grieve, MD  aspirin 81 MG chewable tablet Chew 1 tablet (81 mg total) by mouth daily. 02/12/17  Yes Zada Finders, MD  buPROPion [facility] SR) 100 MG 12 hr tablet Take 1 tablet  (100 mg total) by mouth 2 (two) times daily. 05/27/17  Yes Rosita Fire, MD  diltiazem (CARDIZEM CD) 120 MG 24 hr capsule Take 1 capsule (120 mg total) by mouth daily. 07/19/17  Yes Lacroce, Hulen Shouts, MD  dronabinol (MARINOL) 10 MG capsule Take 10 mg by mouth 2 (two) times daily before a meal.   Yes [provider]  ENSURE (ENSURE) Take 237 mLs by mouth 2 (two) times daily.   Yes [provider]  metoprolol succinate (TOPROL-XL) 50 MG 24 hr tablet Take 1 tablet (50 mg total) by mouth every 12 (twelve) hours. Take with or immediately following a meal. 06/19/17  Yes Alphonzo Grieve, MD  oxyCODONE-acetaminophen (PERCOCET) 10-325 MG tablet Take 1 tablet by mouth every 4 (four) hours as needed for pain. Do not take more than 5 tablets in 24 hours. 06/30/17 06/29/18 Yes Zada Finders, MD  predniSONE (DELTASONE) 10 MG tablet Take 1 tablet (10 mg total) by mouth daily with breakfast for 10 days. Patient taking differently: Take 10 mg by mouth daily with breakfast. To start after completing 10 day course of 20 mg tablets: take one tablet (10 mg) by mouth daily for 10 days, then stop 07/18/17 07/28/17 Yes Lacroce, Hulen Shouts, MD  predniSONE (DELTASONE) 20 MG tablet Take 1 tablet (20 mg total) by mouth daily with breakfast for 10 days. Patient taking differently: Take 20 mg by mouth See admin instructions. Start date 07/18/17: take 1 tablet (20 mg) by mouth daily for 10 days, then take 10 mg tablets daily for 10 days 07/19/17 07/29/17 Yes Lacroce, Hulen Shouts, MD  rosuvastatin (CRESTOR) 20 MG tablet TAKE 1 TABLET EVERY DAY AT 6 PM Patient taking differently: Take 20 mg by mouth daily at 6 PM.  07/04/17  Yes Zada Finders, MD  senna-docusate (SENOKOT-S) 8.6-50 MG tablet Take 2 tablets by mouth 2 (two) times daily. Patient taking differently: Take 1 tablet by mouth daily.  02/12/17  Yes Zada Finders, MD  tiotropium (SPIRIVA) 18 MCG inhalation capsule Place 1 capsule (18 mcg total) into inhaler and  inhale daily. 07/19/17  Yes Lacroce, Hulen Shouts, MD  feeding supplement (BOOST / RESOURCE BREEZE) LIQD Take 1 Container by mouth 3 (three) times daily between meals. Patient not taking: Reported on 07/21/2017 09/09/16   Burgess Estelle, MD  polyethylene glycol [facility] / Floria Raveling) packet Take 17 g by mouth daily as needed. Patient not taking: Reported on 05/22/2017 02/12/17   Zada Finders, MD  tiotropium (SPIRIVA) 18 MCG inhalation capsule Place 1 capsule (  18 mcg total) into inhaler and inhale daily. Patient not taking: Reported on 07/21/2017 07/04/17   Zada Finders, MD    Family History Family History  Problem Relation Age of Onset  . Hypertension Mother   . Asthma Mother   . Coronary artery disease Mother   . Cancer Mother   . Stroke Mother   . Cancer Brother        Unsure of type.  [name] Heart disease Brother   . Heart attack Brother   . Heart attack Sister   . Colon cancer Neg Hx   . Lung cancer Neg Hx   . Prostate cancer Neg Hx     Social History Social History   Tobacco Use  . Smoking status: Current Some Day Smoker    Packs/day: 0.25    Years: 54.00    Pack years: 13.50    Types: Cigarettes    Start date: 66  . Smokeless tobacco: Current User  . Tobacco comment: cutting back  Substance Use Topics  . Alcohol use: No    Alcohol/week: 0.0 oz    Comment: History of ETOH Abuse."  . Drug use: Yes    Frequency: 1.0 times per week    Types: Marijuana    Comment: Every other day.      Allergies   Amiodarone; Tape; and Penicillins   Review of Systems Review of Systems  Constitutional: Positive for appetite change. Negative for fatigue.  HENT: Negative for congestion.   Respiratory: Negative for shortness of breath.   Cardiovascular: Negative for chest pain.  Gastrointestinal: Positive for abdominal pain, diarrhea, nausea and vomiting.  Genitourinary: Negative for flank pain.  Musculoskeletal: Negative for back pain.  Neurological: Negative for numbness.    Hematological: Does not bruise/bleed easily.  Psychiatric/Behavioral: Negative for confusion.     Physical Exam Updated Vital Signs BP 123/78   Pulse 69   Temp 98.1 F (36.7 C) (Oral)   Resp 16   Ht '6\' 3"'  (1.905 m)   Wt 63.5 kg (139 lb 15.9 oz)   SpO2 98%   BMI 17.50 kg/m   Physical Exam  Constitutional: He appears well-developed.  HENT:  Head: Normocephalic.  Eyes: Pupils are equal, round, and reactive to light.  Neck: Neck supple.  Cardiovascular:  Irregular tachycardia.  Pulmonary/Chest: Effort normal.  Abdominal: There is tenderness.  Diffuse without rebound or guarding.  No hernias palpated.  Abdominal tenderness  Musculoskeletal: He exhibits no edema.  Neurological: He is alert.  Skin: Skin is warm. Capillary refill takes less than 2 seconds.     ED Treatments / Results  Labs (all labs ordered are listed, but only abnormal results are displayed) Labs Reviewed  COMPREHENSIVE METABOLIC PANEL - Abnormal; Notable for the following components:      Result Value   Glucose, Bld 120 (*)    BUN 36 (*)    Creatinine, Ser 1.57 (*)    Total Protein 6.4 (*)    AST 107 (*)    ALT 153 (*)    GFR calc non Af Amer 42 (*)    GFR calc Af Amer 49 (*)    All other components within normal limits  CBC WITH DIFFERENTIAL/PLATELET - Abnormal; Notable for the following components:   WBC 18.9 (*)    Hemoglobin 17.9 (*)    HCT 52.2 (*)    Neutro Abs 13.4 (*)    Monocytes Absolute 1.8 (*)    All other components within normal limits  I-STAT CG4 LACTIC  ACID, ED - Abnormal; Notable for the following components:   Lactic Acid, Venous 2.25 (*)    All other components within normal limits  C DIFFICILE QUICK SCREEN W PCR REFLEX  LIPASE, BLOOD    EKG  EKG Interpretation  Date/Time:  Monday July 21 2017 13:06:23 EST Ventricular Rate:  153 PR Interval:    QRS Duration: 92 QT Interval:  298 QTC Calculation: 435 R Axis:   71 Text Interpretation:  Atrial fibrillation with  rapid V-rate Borderline low voltage, extremity leads Abnormal R-wave progression, late transition Repolarization abnormality, prob rate related afib is chronic Confirmed by Davonna Belling [phone]) on 07/21/2017 1:10:49 PM       Radiology Dg Chest 1 View  Result Date: 07/21/2017 CLINICAL DATA:  Pain left lower chest area.  No SOB. EXAM: CHEST 1 VIEW COMPARISON:  Chest x-rays dated 07/14/2017 and 10/30/2016. FINDINGS: Chronic opacities throughout the left lung, most prominent at the mid and lower lung zones, with stable architectural distortion. Calcified pleural plaques better demonstrated on earlier chest CT, indicating associated prior asbestos exposure. Right lung remains relatively clear. Heart size and mediastinal contours appear stable. Atherosclerotic changes again noted at the aortic arch. Right chest wall pacemaker leads appear stable in position. No acute or suspicious osseous finding. IMPRESSION: 1. No acute findings. 2. Stable chronic opacities and architectural distortion involving the majority of the left lung. Calcified pleural plaques better demonstrated on earlier chest CT, indicating associated asbestos exposure. 3. Aortic atherosclerosis. Electronically Signed   By: Franki Cabot M.D.   On: 07/21/2017 15:56   Ct Angio Abd/pel W And/or Wo Contrast  Result Date: 07/21/2017 CLINICAL DATA:  72 year old male with a history of abdominal pain and questionable mesenteric ischemia EXAM: CTA ABDOMEN AND PELVIS wITHOUT AND WITH CONTRAST TECHNIQUE: Multidetector CT imaging of the abdomen and pelvis was performed using the standard protocol during bolus administration of intravenous contrast. Multiplanar reconstructed images and MIPs were obtained and reviewed to evaluate the vascular anatomy. CONTRAST:  122m ISOVUE-370 IOPAMIDOL (ISOVUE-370) INJECTION 76% COMPARISON:  CT 10/30/2016, 02/02/2016, 11/15/2015, 10/13/2014 FINDINGS: VASCULAR Aorta: Atherosclerotic changes of the abdominal aorta,  predominantly distally with no dissection flap. No periaortic fluid or inflammatory changes. No aneurysm. Celiac: Celiac artery patent without significant atherosclerotic changes at the origin. Celiac artery contributes to the splenic artery, left gastric artery, and common hepatic artery. 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 This is unchanged compared to the prior CT. SMA: Superior mesenteric artery patent without significant atherosclerotic changes. Renals: Main right renal artery patent without significant atherosclerotic changes at the origin. There is a small accessory right renal artery to the lower pole. Main left renal artery patent without significant atherosclerotic changes. IMA: IMA is patent, though appears stenotic at the origin secondary to atherosclerotic changes. Left colic artery is patent. Right lower extremity: Mild tortuosity of the right iliac system. Mild atherosclerotic changes of the common and external iliac arteries. Hypogastric artery remains patent. No aneurysm or dissection. Common femoral artery patent with mild atherosclerotic changes. The proximal SFA and the profunda femoris remains patent. Left lower extremity: Mild tortuosity of the left iliac system. Mild atherosclerotic changes of the common and external iliac artery. Hypogastric artery remains patent. No aneurysm or dissection. Common femoral artery with mild atherosclerotic changes. Proximal SFA and profunda femoris remain patent. Veins: There appears to be developing cavernous transformation of the portal vein at the liver hilum, with multiple collateral venous channels. Portal vein  appears patent. Splenic vein appears patent. Review of the MIP images confirms the above findings. NON-VASCULAR Lower chest: Respiratory motion limits evaluation of the lung bases. Similar appearance of pleural plaques within the left-sided pleural space. Similar appearance  of nodular changes at the left lung base associated with the pleural plaques, compatible with rounded atelectasis no confluent airspace disease. Leftward shift of the mediastinal structures, similar to the comparison. Hepatobiliary: Unremarkable appearance of liver parenchyma. There is trace intrahepatic biliary ductal dilatation without radiopaque stones. No evidence of extrahepatic biliary ductal dilatation. Unremarkable appearance of the gallbladder without radiopaque stones. No dilation of the common bile duct. Pancreas: Unremarkable appearance of the pancreatic parenchyma Spleen: Unremarkable spleen Adrenals/Urinary Tract: Unremarkable appearance of the bilateral adrenal glands Right: Renal parenchymal thinning without hydronephrosis. There are a few small hypodense lesions of kidney parenchyma, none of which are completely characterized. No evidence of hydronephrosis. Unremarkable course of the right ureter. Left: Left-sided renal parenchymal thinning. No hydronephrosis. No nephrolithiasis. There are small hypodense lesions of the cortex, incompletely characterized by CT. Unremarkable course of the left ureter. Unremarkable appearance of the urinary bladder . Stomach/Bowel: Unremarkable appearance of the stomach. Small bowel is relatively decompressed without transition point. No abnormally distended small bowel. There appears to be relatively maintained perfusion of small bowel wall/mucosa, without evidence of ischemia. There is significant formed stool within the rectal vault with mild inflammatory changes in the adjacent fat. Large stool burden throughout the length of the colon. Mild dilation of the transverse colon. Appendix is not visualized, however, no inflammatory changes are present adjacent to the cecum to indicate an appendicitis. No evidence of perforation, with no free air. Lymphatic: No adenopathy. Mesenteric: No free fluid or air. No adenopathy. Reproductive: Prostate not well visualized  secondary to streak artifact from metallic hardware. Other: No hernia. Musculoskeletal: Bilateral hip arthroplasty contributes to streak artifact of the pelvis, which limits evaluation of the pelvic structures. Degenerative changes of the lumbar with no bony canal narrowing. Advanced facet disease of the lower lumbar levels. 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 Trace retrolisthesis of L3 on L4.  No displaced fracture. IMPRESSION: No acute vascular abnormality. Mild atherosclerotic changes of the aorta, with no significant mesenteric arterial disease that was suggest acute or chronic mesenteric ischemia. Aortic Atherosclerosis (ICD10-I70.0). Significant formed stool burden of the rectum, suggesting at least constipation, potentially obstipation. There are mild inflammatory changes in the fat adjacent to the rectum, which can be seen with stercoral colitis. Correlation with symptoms/patient presentation may be useful. Redemonstration of left-sided pleural plaques with associated rounded atelectasis. Portal vein remains patent, with what appears to be developing cavernous transformation at the liver hilum. Signed, Dulcy Fanny. Earleen Newport, DO [facility] Radiology Electronically Signed   By: Corrie Mckusick D.O.   On: 07/21/2017 16:20    Procedures Procedures (including critical care time)  Medications Ordered in ED Medications  diltiazem (CARDIZEM) 1 mg/mL load via infusion 10 mg (10 mg Intravenous Bolus from Bag 07/21/17 1454)    And  diltiazem (CARDIZEM) 100 mg in dextrose 5% 158m (1 mg/mL) infusion (5 mg/hr Intravenous New Bag/Given 07/21/17 1454)  ondansetron (ZOFRAN) injection 4 mg (not administered)  apixaban (ELIQUIS) tablet 2.5 mg (2.5 mg Oral Given 07/21/17 2200)  aspirin chewable tablet 81 mg (not administered)  buPROPion (WELLBUTRIN SR) 12 hr tablet 100 mg (100 mg Oral Given 07/21/17 2200)  acetaminophen (TYLENOL) tablet 650 mg (not  administered)    Or  acetaminophen (TYLENOL) suppository  650 mg (not administered)  diltiazem (CARDIZEM CD) 24 hr capsule 120 mg (not administered)  metoprolol succinate (TOPROL-XL) 24 hr tablet 50 mg (not administered)  predniSONE (DELTASONE) tablet 20 mg (not administered)  0.9 %  sodium chloride infusion ( Intravenous New Bag/Given 07/21/17 1958)  oxyCODONE-acetaminophen (PERCOCET/ROXICET) 5-325 MG per tablet 1 tablet (1 tablet Oral Given 07/21/17 1957)    And  oxyCODONE (Oxy IR/ROXICODONE) immediate release tablet 5 mg (5 mg Oral Given 07/21/17 1957)  sodium chloride 0.9 % bolus 500 mL (0 mLs Intravenous Stopped 07/21/17 1454)  sodium chloride 0.9 % bolus 500 mL (0 mLs Intravenous Stopped 07/21/17 1554)  iopamidol (ISOVUE-370) 76 % injection (100 mLs  Contrast Given 07/21/17 1511)  fentaNYL (SUBLIMAZE) injection 50 mcg (50 mcg Intravenous Given 07/21/17 1453)  ondansetron (ZOFRAN) injection 4 mg (4 mg Intravenous Given 07/21/17 1453)  magnesium citrate solution 1 Bottle (1 Bottle Oral Given 07/21/17 2057)  milk and molasses enema (250 mLs Rectal Given 07/21/17 2211)     Initial Impression / Assessment and Plan / ED Course  I have reviewed the triage vital signs and the nursing notes.  Pertinent labs & imaging results that were available during my care of the patient were reviewed by me and considered in my medical decision making (see chart for details).     The patient presented with nausea vomiting diarrhea and abdominal pain.  Also left-sided chest pain.  Found to be in atrial fibrillation with RVR.  Started on Cardizem drip.  History of atrial fibrillation.  Likely component of dehydration and rate improved somewhat with IV fluids.  Already on anticoagulation.  CT angiography done due to abdominal pain with A. fib and elevated white count.  Will admit to internal medicine residents.  Final Clinical Impressions(s) / ED Diagnoses   Final diagnoses:  Nausea vomiting and diarrhea  Atrial  fibrillation with rapid ventricular response [facility])  Dehydration    ED Discharge Orders    None       Davonna Belling, MD 07/21/17 2238

## 2017-07-21 NOTE — ED Triage Notes (Signed)
Pt arrives via EMS from independent living with complaints of left sided chest pain that began Saturday. Pt endorses abdominal pain and diarrhea that started around then as well. 324 ASA given pta.   AFIB 150-180 144/96 cbg 128 02 100 RA A&Ox 4

## 2017-07-21 NOTE — ED Notes (Signed)
I Stat Lactic Acid results of 2.25 reported to Dr. Alvino Chapel, repeated back.

## 2017-07-21 NOTE — ED Notes (Signed)
Awaiting medications to be verified by pharmacy

## 2017-07-21 NOTE — H&P (Signed)
Date: 07/21/2017               Patient Name:  [name] MRN: [id number]  DOB: 12-Dec-1945 Age / Sex: 72 y.o., male   PCP: Zada Finders, MD         Medical Service: Internal Medicine Teaching Service         Attending Physician: Dr. Annia Belt, MD    First Contact: Dr. Rochele Pages, MD Pager: [phone]  Second Contact: Dr. 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   He was admitted 2/25-3/1 for treatment of COPD exacerbation and atrial fibrillation with RVR. He completed a 5 day course of Azithromycin and was discharged with an extended course of steroids at patient request for appetite stimulation. He reports developing severe abdominal pain with watery stools on Saturday morning. He also admits to  nausea, several episodes of NB emesis and left-sided chest pain which he attributes to increased heart rate. He has continued to experience nearly constant abdominal pain with up to 3 watery stools a day.   He has not tried any conservative measures at home. He states he has been able to take and keep down all of his home medications. Had similar episode of diarrhea "a while ago" but was not associated with this  severe of abdominal pain. Does describe a similar history over a year ago when he was "really constipated." He is unable to elaborate on character or exacerbating/remitting factors.   In the ED he was noted to have atrial fibrillation with RVR to 150's. He was afebrile with stable blood pressures and was saturating well on room air. Given his recent exposure to antibiotics, enteric precautions were placed and C.diff screen was ordered. CMET demonstrated AKI with Cr 1.57 (BL ~1.3), elevated BUN at 36, elevated AST and ALT at 107 ad 153, respectively. CBC with leukocytosis to 19,000 and Hb 17.9. Lactic acid 2.25 and lipase was normal. He was started on cardizem infusion, given 1L normal saline, fentanyl and CT angiography of abdomen and pelvis was ordered. IMTS was contacted for admission.   Meds:  Current Meds  Medication Sig  . albuterol (VENTOLIN HFA) 108 (90 Base) MCG/ACT inhaler Inhale 2 puffs into the lungs every 6 (six) hours as needed for wheezing or shortness of breath.  [name] apixaban (ELIQUIS) 2.5 MG TABS tablet Take 1 tablet (2.5 mg total) by mouth 2 (two) times daily.  [name] aspirin 81 MG chewable tablet Chew 1 tablet (81 mg total) by mouth daily.  [name] buPROPion (WELLBUTRIN SR) 100 MG 12 hr tablet Take 1 tablet (100 mg total) by mouth 2 (two) times daily.  [name] diltiazem (CARDIZEM CD) 120 MG 24 hr capsule Take 1 capsule (120 mg total) by mouth daily.  [name] dronabinol (MARINOL) 10 MG capsule Take 10 mg  by mouth 2 (two) times daily before a meal.  . ENSURE (ENSURE) Take 237 mLs by mouth 2 (two) times daily.  . metoprolol succinate (TOPROL-XL) 50 MG 24 hr tablet Take 1 tablet (50 mg total) by mouth every 12 (twelve) hours. Take with or immediately following a meal.  . oxyCODONE-acetaminophen (PERCOCET) 10-325 MG tablet Take 1 tablet by mouth every 4 (four) hours as needed for pain. Do not take more than 5 tablets in 24 hours.  . predniSONE (DELTASONE) 10 MG tablet Take 1 tablet (10 mg total) by mouth daily  with breakfast for 10 days. (Patient taking differently: Take 10 mg by mouth daily with breakfast. To start after completing 10 day course of 20 mg tablets: take one tablet (10 mg) by mouth daily for 10 days, then stop)  . predniSONE (DELTASONE) 20 MG tablet Take 1 tablet (20 mg total) by mouth daily with breakfast for 10 days. (Patient taking differently: Take 20 mg by mouth See admin instructions. Start date 07/18/17: take 1 tablet (20 mg) by mouth daily for 10 days, then take 10 mg tablets daily for 10 days)  . rosuvastatin (CRESTOR) 20 MG tablet TAKE 1 TABLET EVERY DAY AT 6 PM (Patient taking differently: Take 20 mg by mouth daily at 6 PM. )  . senna-docusate (SENOKOT-S) 8.6-50 MG tablet Take 2 tablets by mouth 2 (two) times daily. (Patient taking differently: Take 1 tablet by mouth daily. )  . tiotropium (SPIRIVA) 18 MCG inhalation capsule Place 1 capsule (18 mcg total) into inhaler and inhale daily.   Allergies: Allergies as of 07/21/2017 - Review Complete 07/21/2017  Allergen Reaction Noted  . Amiodarone Other (See Comments) 07/30/2015  . Penicillins Rash and Other (See Comments)    Past Medical History:  Diagnosis Date  . Alcohol abuse   . Anxiety   . Asbestos exposure   . Avascular necrosis of bones of both hips (HCC)    Status post bilateral hip replacements  . Bipolar disorder ([place])   . Chronic systolic heart failure ([place])    a. NICM - EF 35% with normal cors 2003. b. Last echo 11/2012 - EF 25-30%.  . CKD (chronic kidney disease), stage III ([place])    Renal U/S 12/04/2009 showed no pathological findings. Labs 12/04/2009 include normal ESR, C3, C4; neg ANA; SPEP showed nonspecific increase in the alpha-2 region with no M-spike; UPEP showed no monoclonal free light chains; urine IFE showed polyclonal increase in feree Kappa and/or free Lambda light chains. Baseline Cr reported 1.7-2.5.  [name] COPD (chronic obstructive pulmonary disease) ([place])   . Depression   . DVT (deep venous thrombosis)  ([place])    He has hypercoagulability with multiple prior DVTs  . E coli bacteremia   . Erectile dysfunction   . Failure to thrive (0-17) 08/2016   DEHYDRATION  . Gastritis   . GERD (gastroesophageal reflux disease)   . HCAP (healthcare-associated pneumonia) 08/24/2015  . Hydrocele, unspecified   . Hyperlipemia   . Hypertension   . 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  Thyroid scan 08/28/2009 was normal with no focal areas of abnormal increased or decreased activity seen; the uptake of I 131 sodium iodide at 24 hours was 5.7%.  TSH and free T4 normalized by 08/16/2009.  . Intestinal obstruction ([place])   . Lung nodule    Chest CT scan on 12/14/2008 showed a nodular opacity at the left lung base felt to most likely represent scarring.  Follow-up  chest CT scan on 06/02/2010 showed parenchymal scarring in the left apex, left  lower lobe, and lingula; some of this scarring at the left base had a nodular appearance, unchanged. Chest 09/2012 - stable.  [name] NICM (nonischemic cardiomyopathy) ([place])   . Noncompliance   . On home oxygen therapy    "3L periodically" (08/24/2015)  . Osteoarthritis   . Permanent atrial fibrillation ([place])    a. failed ablation in 2010 due to inability to access/venous occlusions. b. failed multaq, amiodarone, tikosyn. c. has not been felt to be a good candidate for AVN ablation due to recurrent infections/bacteremia history.  . Pleural plaque    H/o asbestos exposure. Chest CT on 06/02/2010 showed stable extensive calcified pleural plaques involving the left hemithorax, consistent with asbestos related pleural disease.  . Portal vein thrombosis   . Protein calorie malnutrition ([place]) 04/2016  . Spondylosis   . STEMI (ST elevation myocardial infarction) ([place]) 16/02/9603   a. felt embolic in nature due to noncompliance with anticoagulation, LHC not pursued.  . Tachycardia-bradycardia syndrome [facility])    a. 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 b. St.  Jude gen change 2010.  [name] Thrombocytopenia ([place])   . 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  . Weight loss    Normal colonoscopy by Dr. Olevia Perches on 11/06/2010.   Family History: Noncontributory.  Mother deceased with history of HTN, CAD, Cancer and CVA Father deceased.   Social History: Mr. Higginbotham lives alone. He denies any recent alcohol use although does endorse prior history of heavy consumption. He smokes marijuana several times a week but denies any other recreational drug use.   Review of Systems: A complete ROS was negative except as per HPI.  Physical Exam: Blood pressure (!) 147/116, pulse (!) 151, temperature 98.1 F (36.7 C), temperature source Oral, resp. rate 18, SpO2 98 %. General: Cachectic african american male retching into emesis bag upon arrival to room. Appears fatigued, diaphoretic and pale.  HENT: PERRL. No conjunctival injection, icterus or ptosis. Dry mucous membranes.  Cardiovascular: Irregular rhythm with tachycardia. Pulmonary: CTA BL, no wheezing, crackles. Unlabored breathing.  Abdomen: Soft with mild distention and firmness. Not tense. TTP throughout abdomen but most prominent BL LQ. No rebound tenderness. No ascites.  Extremities: No lower extremity edema BL. Sarcopenic with scant muscle mass. Skin: Forehead with some diaphoresis, otherwise appeared warm and dry. No cyanosis or suspicious rashes. No jaundice.  Neuro: Alert and oriented. Strength and sensation grossly intact.  Psych: Flat affect. Responds to questions appropriately.   EKG: personally reviewed my interpretation is atrial fibrillation with rapid ventricular response to ~150. No ST segment changes appreciated.   IMAGING STUDIES CXR 1-view 07/21/17: IMPRESSION: 1. No acute findings. 2. Stable chronic opacities and architectural distortion involving the majority of the left lung.  Calcified pleural plaques better demonstrated on earlier chest CT, indicating associated asbestos exposure. 3. Aortic atherosclerosis.  CTA abdomen/pelvis 07/21/17: IMPRESSION: No acute vascular abnormality. Mild atherosclerotic changes of the aorta, with no significant mesenteric arterial disease that was suggest acute or chronic mesenteric ischemia. Aortic Atherosclerosis. Significant formed stool burden of the rectum, suggesting at least constipation, potentially obstipation. There are mild inflammatory changes in the fat adjacent to the rectum, which can be seen with stercoral colitis. Correlation with symptoms/patient presentation may be useful. Portal vein with what appears to be developing cavernous transformation at the liver hilum.  Assessment & Plan by Problem: This  is a chronically-ill 72 year-old male with atrial fibrillation, CHF, history of fecal impactions and Stercoral colitis and recent admission for COPD (Treated with Azithromycin and Prednisone) presents with 3-day history of abdominal pain, nausea, vomiting and diarrhea. On arrival he was in atrial fibrillation with RVR and started on cardizem infusion. CT abdomen/pelvis with large stool burden throughout the colon and rectum with inflammation suggesting Stercoral Colitis. He is receiving laxatives and enemas. Given his recent antibiotic use, C.diff toxin is pending.   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 Has not had large BM in several days. CT abdomen/pelvis shows large stool burden in rectum with evidence for stercoral colitis. Chart review shows similar presentations in the past  which responded to laxatives and enemas and had significant relief of his symptoms. He will be treated with an aggressive bowel regimen. Given his recent antibiotic use, leukocytosis and diarrhea, C. Diff toxin is pending.  -Aggressive bowel regimen with Miralax and M&M enema. These interventions have seemed to help in the past without need for manual disimpaction -Follow C. Diff panel, although unlikely given his large stool burden -NPO until nausea and vomiting have improved -His most recent colonoscopy was normal in 2012 with 10-year follow-up.    Elevated AST, ALT AST 107, ALT 153. Alk phos normal at 53 and bilirubin normal as well at 0.8. This does not suggest a cholestatic pattern. No recorded hypotension to suggest hypoperfusion as underlying cause. He denied any recent significant alcohol use and denies excessive tylenol or NSAID use recently. Given presentation of acute nausea, vomiting, diarrhea and abdominal pain there is some concern for possible hepatitis although . Will order acute hepatitis panel for evaluation, even though unlikely -Follow-up acute hepatitis panel -Follow liver function with CMET in AM  Atrial Fibrillation with Rapid Ventricular Response Patient endorses compliance with home medications including Metoprolol 43m BID, Diltiazem 1259mQdaily and Apixaban 2.39m57mID. Despite this, he presented with rapid ventricular response to 150's. He was started on a cardizem infusion by the ED which has been continued. Suspect his current presentation related to dehydration +/- loss of medication from emesis. Rates seem to be improving and is currently receiving 39mg22m. Will work towards weaning off of cardizem infusion and treating underlying exacerbating factors.  -Admit to SDU for cardizem infusion; work towards weaning off -Continue home Metoprolol 50mg239m and Cardizem 120mg 76my.  -Continue home Eliquis 2.39mg BI34mNo signs of bleeding currently and do not expect upcoming  procedure  Chronic Respiratory Failure, End-Stage COPD Recent Admission for COPD Exacerbation Currently saturating well on room air. Uses 2-3L O2 via [place] at home PRN. Lungs are clear and he is not in respiratory distress. Notes compliance with antibiotic and steroid course outpatient with improved symptoms.  -Continue supplemental O2 as needed -Continue home Spiriva and prn albuterol  Chronic Kidney Disease, Stage III Cr 1.57 on presentation with BUN elevation to 36. He has variable "baseline" renal function but was most recently 1.18 during his recent hospitalization. I suspect he is dehydrated, especially given his hemoconcentration on CBC.  -IVF for volume replacement -Follow renal function with AM Cr and lytes  Chronic Combined Systolic and Diastolic Congestive Heart Failure Patient without signs or  symptoms of volume overload on exam and actually appears quite dry. I am treating him with gentle and cautious fluid replacement with careful eye on volume status. He has received 1L total of ns and will likely need additional fluid replacement.   Severe Malnutrition BMI 15%. He has a poor appetite chronically and is on Marinol at home. He is currently on an extended course of steroids for COPD exacerbation at patient request to increase his appetite. He usually has 2 nutrition shakes a day, which will be ordered when patient able to tolerate PO intake reliably.   History of Depression, Bipolar 1 Disorder On Wellbutrin at home which has been continued inpatient.   Diet: NPO for now due to frequent emesis and concern for possible bowel obstruction IVF: Received 1 L ns, will continue gentle IVF with ns @ 169ms/hr x 12 hrs DVT ppx: On Eliquis 2.523mBID Code status: Patient is DNR  Dispo: Admit patient to Inpatient with expected length of stay greater than 2 midnights.  Signed: Einar GipDO 07/21/2017, 2:49 PM  Pager: [phone]

## 2017-07-22 ENCOUNTER — Other Ambulatory Visit: Payer: Self-pay

## 2017-07-22 ENCOUNTER — Encounter (HOSPITAL_COMMUNITY): Payer: Self-pay | Admitting: *Deleted

## 2017-07-22 DIAGNOSIS — R197 Diarrhea, unspecified: Secondary | ICD-10-CM

## 2017-07-22 DIAGNOSIS — Z7901 Long term (current) use of anticoagulants: Secondary | ICD-10-CM

## 2017-07-22 DIAGNOSIS — K5900 Constipation, unspecified: Secondary | ICD-10-CM

## 2017-07-22 DIAGNOSIS — R140 Abdominal distension (gaseous): Secondary | ICD-10-CM

## 2017-07-22 DIAGNOSIS — Z79891 Long term (current) use of opiate analgesic: Secondary | ICD-10-CM

## 2017-07-22 DIAGNOSIS — Z79899 Other long term (current) drug therapy: Secondary | ICD-10-CM

## 2017-07-22 DIAGNOSIS — R112 Nausea with vomiting, unspecified: Secondary | ICD-10-CM

## 2017-07-22 DIAGNOSIS — I482 Chronic atrial fibrillation: Secondary | ICD-10-CM

## 2017-07-22 DIAGNOSIS — J9610 Chronic respiratory failure, unspecified whether with hypoxia or hypercapnia: Secondary | ICD-10-CM

## 2017-07-22 DIAGNOSIS — R740 Nonspecific elevation of levels of transaminase and lactic acid dehydrogenase [LDH]: Secondary | ICD-10-CM

## 2017-07-22 DIAGNOSIS — N183 Chronic kidney disease, stage 3 (moderate): Secondary | ICD-10-CM

## 2017-07-22 DIAGNOSIS — J449 Chronic obstructive pulmonary disease, unspecified: Secondary | ICD-10-CM

## 2017-07-22 DIAGNOSIS — E43 Unspecified severe protein-calorie malnutrition: Secondary | ICD-10-CM

## 2017-07-22 DIAGNOSIS — Z66 Do not resuscitate: Secondary | ICD-10-CM

## 2017-07-22 DIAGNOSIS — I5042 Chronic combined systolic (congestive) and diastolic (congestive) heart failure: Secondary | ICD-10-CM

## 2017-07-22 DIAGNOSIS — F339 Major depressive disorder, recurrent, unspecified: Secondary | ICD-10-CM

## 2017-07-22 LAB — C DIFFICILE QUICK SCREEN W PCR REFLEX
C DIFFICILE (CDIFF) TOXIN: NEGATIVE
C DIFFICLE (CDIFF) ANTIGEN: NEGATIVE
C Diff interpretation: NOT DETECTED

## 2017-07-22 LAB — COMPREHENSIVE METABOLIC PANEL
ALT: 101 U/L — ABNORMAL HIGH (ref 17–63)
AST: 61 U/L — AB (ref 15–41)
Albumin: 3 g/dL — ABNORMAL LOW (ref 3.5–5.0)
Alkaline Phosphatase: 43 U/L (ref 38–126)
Anion gap: 9 (ref 5–15)
BUN: 31 mg/dL — ABNORMAL HIGH (ref 6–20)
CHLORIDE: 109 mmol/L (ref 101–111)
CO2: 24 mmol/L (ref 22–32)
Calcium: 8.6 mg/dL — ABNORMAL LOW (ref 8.9–10.3)
Creatinine, Ser: 1.33 mg/dL — ABNORMAL HIGH (ref 0.61–1.24)
GFR calc Af Amer: 60 mL/min — ABNORMAL LOW (ref >60)
GFR, EST NON AFRICAN AMERICAN: 51 mL/min — AB (ref >60)
Glucose, Bld: 80 mg/dL (ref 65–99)
POTASSIUM: 4.8 mmol/L (ref 3.5–5.1)
Sodium: 142 mmol/L (ref 135–145)
Total Bilirubin: 0.8 mg/dL (ref 0.3–1.2)
Total Protein: 5.2 g/dL — ABNORMAL LOW (ref 6.5–8.1)

## 2017-07-22 LAB — CBC
HCT: 41.6 % (ref 39.0–52.0)
Hemoglobin: 13.9 g/dL (ref 13.0–17.0)
MCH: 31 pg (ref 26.0–34.0)
MCHC: 33.4 g/dL (ref 30.0–36.0)
MCV: 92.9 fL (ref 78.0–100.0)
PLATELETS: 137 10*3/uL — AB (ref 150–400)
RBC: 4.48 MIL/uL (ref 4.22–5.81)
RDW: 15.6 % — AB (ref 11.5–15.5)
WBC: 16.8 10*3/uL — AB (ref 4.0–10.5)

## 2017-07-22 MED ORDER — POLYETHYLENE GLYCOL 3350 17 G PO PACK
17.0000 g | PACK | Freq: Two times a day (BID) | ORAL | Status: DC
  Administered 2017-07-22 – 2017-07-23 (×2): 17 g via ORAL
  Filled 2017-07-22 (×2): qty 1

## 2017-07-22 MED ORDER — BOOST / RESOURCE BREEZE PO LIQD CUSTOM: 1.0000 | Freq: Three times a day (TID) | ORAL | Status: DC

## 2017-07-22 MED ORDER — BOOST / RESOURCE BREEZE PO LIQD CUSTOM
1.0000 | Freq: Three times a day (TID) | ORAL | Status: DC
  Administered 2017-07-22 – 2017-07-27 (×12): 1 via ORAL

## 2017-07-22 MED ORDER — DICLOFENAC SODIUM 1 % TD GEL
2.0000 g | Freq: Four times a day (QID) | TRANSDERMAL | Status: DC | PRN
  Administered 2017-07-22 – 2017-07-23 (×2): 2 g via TOPICAL
  Filled 2017-07-22 (×2): qty 100

## 2017-07-22 MED ORDER — GI COCKTAIL ~~LOC~~
30.0000 mL | Freq: Three times a day (TID) | ORAL | Status: DC | PRN
  Administered 2017-07-22 – 2017-07-23 (×4): 30 mL via ORAL
  Filled 2017-07-22 (×4): qty 30

## 2017-07-22 MED ORDER — ALBUTEROL SULFATE (2.5 MG/3ML) 0.083% IN NEBU
2.5000 mg | INHALATION_SOLUTION | Freq: Four times a day (QID) | RESPIRATORY_TRACT | Status: DC | PRN
  Administered 2017-07-23: 2.5 mg via RESPIRATORY_TRACT
  Filled 2017-07-22 (×2): qty 3

## 2017-07-22 MED ORDER — DICLOFENAC SODIUM 1 % TD GEL
2.0000 g | Freq: Four times a day (QID) | TRANSDERMAL | Status: DC
  Filled 2017-07-22: qty 100

## 2017-07-22 MED ORDER — MILK AND MOLASSES ENEMA
1.0000 | Freq: Once | RECTAL | Status: DC
  Filled 2017-07-22: qty 250

## 2017-07-22 MED ORDER — TIOTROPIUM BROMIDE MONOHYDRATE 18 MCG IN CAPS
18.0000 ug | ORAL_CAPSULE | Freq: Every day | RESPIRATORY_TRACT | Status: DC
  Administered 2017-07-22 – 2017-07-27 (×6): 18 ug via RESPIRATORY_TRACT
  Filled 2017-07-22 (×2): qty 5

## 2017-07-22 MED ORDER — SENNOSIDES-DOCUSATE SODIUM 8.6-50 MG PO TABS
1.0000 | ORAL_TABLET | Freq: Two times a day (BID) | ORAL | Status: DC
  Administered 2017-07-22 – 2017-07-23 (×2): 1 via ORAL
  Filled 2017-07-22 (×2): qty 1

## 2017-07-22 MED ORDER — POLYETHYLENE GLYCOL 3350 17 G PO PACK
17.0000 g | PACK | Freq: Every day | ORAL | Status: DC
  Filled 2017-07-22: qty 1

## 2017-07-22 NOTE — Progress Notes (Signed)
Subjective:  Patient had two large bowel movements overnight and states he feels much better. Denies nausea or vomiting. He is requesting a full diet this morning and state he has not ate in days. He still has soreness in his lower abdomen, but this has improved since he had two large bowel movements. Patient denies SOB, breathing at baseline.   At rounds patient stated he tolerated breakfast without nausea or vomiting.  Objective:  Vital signs in last 24 hours: Vitals:   07/22/17 0000 07/22/17 0400 07/22/17 0434 07/22/17 0500  BP: 110/83 101/70  119/78  Pulse: 66 70 67 61  Resp:  13 11 15   Temp: 98.1 F (36.7 C) (!) 97.5 F (36.4 C)    TempSrc: Oral Other (Comment)    SpO2: 99% 100% 100% 99%  Weight:      Height:       General: Laying in bed comfortably, NAD HEENT: [place]/AT, EOMI, no scleral icterus, PERRL Cardiac: Regular rate, irregular rhythm, No R/M/G appreciated Pulm: normal effort, CTAB Abd: soft, tender in LLQ and RLQ, non distended, BS normal Ext: extremities well perfused, no peripheral edema Neuro: alert and oriented X3, cranial nerves II-XII grossly intact   Assessment/Plan:  Principal Problem:   Constipation Active Problems:   Atrial fibrillation with rapid ventricular response (HCC)   Protein-calorie malnutrition, severe   Acute kidney injury ([place])   DNR (do not resuscitate)   Regional colitis ([place])  Constipation CT abdomen/pelvis shows large stool burden throughout length of colon and in rectal vault. Patient has history of constipation sometimes requiring fecal disimpaction likely 2/2 to chronic opioid use and not adequate bowel regimen. C. Diff toxin negative. Patient had two large bowel movements overnight with improvement in symptoms, no nausea or vomiting since BMs. Abdominal exam without distention, mild TTP in RLQ and LLQ. Will advance diet today and see how he tolerates.  -At patient's request will advance to full diet - will de-escalate if N/V  occur -Miralax BID, Senokot BID   Atrial Fibrillation with Rapid Ventricular Response Patient's rates improved overnight HR ranging from 60-70. Will trial off Cardizem gtt and monitor HR with PO medications as HR has improved and underlying symptoms have been treated. Can be transferred out of step-down -Continue home Metoprolol 50mg  BID and Cardizem 120mg  daily.  -Continue home Eliquis 2.5mg  BID  Elevated AST, ALT Unclear etiology. Liver and gallbladder unremarkable on imaging, only trace intrahepatic biliary ductal dilatation without evidence of radiopaque stones.  AST 107, ALT 153 on admission. Improving on repeat: AST 61, ALT 101. T bili 0.8.   -Follow-up acute hepatitis panel -will continue to monitor  Chronic Respiratory Failure, End-Stage COPD Currently saturating well on room air. Uses 2-3L O2 via [place] at home PRN. Lungs are clear and he is not in respiratory distress. Notes compliance with antibiotic and steroid course outpatient with improved symptoms.  -Continue supplemental O2 as needed -Continue home Spiriva and prn albuterol  Chronic Kidney Disease, Stage III Cr 1.57 on presentation with BUN elevation to 36. Creatinine improved to 1.33 following gentle hydration. He is also less hemoconcentrated today.  -Will continue to monitor renal function with AM Cr and lytes  Chronic Combined Systolic and Diastolic Congestive Heart Failure Patient appears euvolemic on examination. Has received a total of 2.5 liters of IVF. -Will continue to monitor volume status  Severe Malnutrition BMI 15%. He has a poor appetite chronically and is on Marinol at home. He is currently on an extended course of steroids for  COPD exacerbation at patient request to increase his appetite.  -Restarted Boost shakes, will de-escalate if patient unable to tolerate PO  History of Depression, Bipolar 1 Disorder Mood stable. -Continue Wellbutrin 100 mg BID  Diet: regular, supplemented with boost  IVF:  none DVT ppx: On Eliquis 2.5mg  BID Code status:  DNR  Dispo: Anticipated discharge in approximately 1-2 day(s).   Melanee Spry, MD 07/22/2017, 7:39 AM Pager: [phone]

## 2017-07-22 NOTE — Progress Notes (Signed)
Medicine attending: I examined this patient today and I concur with the evaluation and management plan as recorded by resident physician Dr. Rochele Pages.  Please see separate attending admission note for complete details. Condition is stabilized rapidly with laxatives and enemas.  Abdomen is mildly distended and tympanitic but nontender.  Patient had an appetite and was able to eat breakfast this morning. Heart rate controlled with temporary parenteral Cardizem.  He will resume his oral antiarrhythmics.

## 2017-07-22 NOTE — Progress Notes (Signed)
Initial Nutrition Assessment  DOCUMENTATION CODES:   Severe malnutrition in context of chronic illness, Underweight  INTERVENTION:   Continue Boost Breeze po TID, each supplement provides 250kcals and 9g of protein.  Magic cup TID with meals, each supplement provides 290kcals and 9g of protein.  Afternoon nourishment.  NUTRITION DIAGNOSIS:   Severe Malnutrition related to chronic illness(COPD, CKD, CAD) as evidenced by severe muscle depletion, severe fat depletion.  GOAL:   Patient will meet greater than or equal to 90% of their needs   MONITOR:   PO intake, I & O's, Supplement acceptance, Weight trends, Skin  REASON FOR ASSESSMENT:   Malnutrition Screening Tool    ASSESSMENT:   72 y.o. Male well known to our service. 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 Pt was D/C 3 days ago after treatment for an acute exacerbation of COPD. When pt returned home he began to develop lower abdominal pain, diarrhea, and vomiting. Found to have fecal impaction; pt has 2 bowel movements overnight and symptoms are improving.   Pt reports his appetite has greatly improved today and stated he ate 100% of breakfast and lunch today. He also reports he has been consuming his [employer] and intern provided one at time of visit per pt request.   Pt reports he has had issues with poor appetite and poor intake for the past two years. This is consistent with previous RD notes. Pt reports taking marinol at home which he feels helps "a little bit." He states prednisone stimulates his appetite. Pt does not feel oxygen or his poor dentition affects his oral intake.   Per chart review, pt admitted with constipation. Pt symptoms improved with 2 bowel movements overnight. Aggressive bowel regiment noted.  Pt reports episodes of nausea accompanied by some vomitting 1-2x/week  for the pas 6-43months without any apparent causes. Pt reports he usually eats 0-1 meal a day PTA. He reports eating meals received from [employer] and sandwiches he can make. Pt states he drinks 2x Ensure a day and will sometimes have 3 per day.   Pt enjoys Boost breeze and like [employer] and [employer]. Pt also amenable to receiving snacks.   Pt reports losing over 100lb within the past 2 years. Pt was unable to provide a UBW. Wt hx outlined below. Pt's wt appears to be stable.   Medications: Milk & molasses enema, miralax, prednisone, senokot, zofran,   Labs: BUN (H;31), Cr (H;1.33), Corrected calcium (WNL), albumin (L;3.0)  NUTRITION - FOCUSED PHYSICAL EXAM:    Most Recent Value  Orbital Region  Severe depletion  Upper Arm Region  Severe depletion  Thoracic and Lumbar Region  Severe depletion  Buccal Region  Severe depletion  Temple Region  Severe depletion  Clavicle Bone Region  Severe depletion  Clavicle and Acromion Bone Region  Severe depletion  Scapular Bone Region  Severe depletion  Dorsal Hand  Severe depletion  Patellar Region  Severe depletion  Anterior Thigh Region  Severe depletion  Posterior Calf Region  Severe depletion  Edema (RD Assessment)  None  Hair  Reviewed  Eyes  Reviewed  Mouth  Reviewed  Skin  Reviewed  Nails  Reviewed       Diet Order:  Diet regular Room service appropriate? Yes; Fluid consistency: Thin  EDUCATION NEEDS:   Education needs have been addressed  Skin:  Skin Assessment: Skin Integrity Issues: Skin  Integrity Issues:: Unstageable Unstageable: heel  Last BM:  3/5 type 2 large  Height:   Ht Readings from Last 1 Encounters:  07/21/17 6\' 3"  (1.905 m)    Weight:   Wt Readings from Last 15 Encounters:  07/21/17 139 lb 15.9 oz (63.5 kg)  07/14/17 126 lb 5.2 oz (57.3 kg)  06/19/17 131 lb (59.4 kg)  05/27/17 134 lb 7.7 oz (61 kg)  02/12/17 135 lb 14.4 oz (61.6 kg)  11/14/16 136 lb (61.7 kg)  11/05/16 125 lb  7.1 oz (56.9 kg)  10/23/16 126 lb (57.2 kg)  10/16/16 131 lb 6.4 oz (59.6 kg)  10/14/16 126 lb 12.8 oz (57.5 kg)  10/08/16 134 lb (60.8 kg)  10/02/16 134 lb (60.8 kg)  09/26/16 136 lb (61.7 kg)  09/17/16 132 lb 1.6 oz (59.9 kg)  09/12/16 128 lb (58.1 kg)    Ideal Body Weight:  89.1 kg  BMI:  Body mass index is 17.5 kg/m.  Estimated Nutritional Needs:   Kcal:  1900-2100  Protein:  95-105g  Fluid:  1.9-2.1L    Malva Diesing, MS, Dietetic Intern Pager # [phone]

## 2017-07-22 NOTE — Progress Notes (Signed)
[facility]  Patient Status: Active (receiving services up to time of hospitalization)  [facility] is providing the following services: PT  If patient discharges after hours, please call [phone].   Janae Sauce 07/22/2017, 12:29 PM

## 2017-07-22 NOTE — Plan of Care (Signed)
  Clinical Measurements: Will remain free from infection 07/22/2017 0218 - Progressing by Angelica Pou, Trilby Drummer, RN Respiratory complications will improve 07/22/2017 0218 - Completed/Met by Tish Frederickson, RN

## 2017-07-23 ENCOUNTER — Inpatient Hospital Stay (HOSPITAL_COMMUNITY): Payer: Medicare Other

## 2017-07-23 DIAGNOSIS — K5939 Other megacolon: Secondary | ICD-10-CM

## 2017-07-23 DIAGNOSIS — E875 Hyperkalemia: Secondary | ICD-10-CM

## 2017-07-23 DIAGNOSIS — R1110 Vomiting, unspecified: Secondary | ICD-10-CM

## 2017-07-23 LAB — COMPREHENSIVE METABOLIC PANEL
ALK PHOS: 46 U/L (ref 38–126)
ALT: 95 U/L — AB (ref 17–63)
AST: 60 U/L — AB (ref 15–41)
Albumin: 2.7 g/dL — ABNORMAL LOW (ref 3.5–5.0)
Anion gap: 6 (ref 5–15)
BUN: 34 mg/dL — AB (ref 6–20)
CALCIUM: 8.5 mg/dL — AB (ref 8.9–10.3)
CO2: 24 mmol/L (ref 22–32)
CREATININE: 1.42 mg/dL — AB (ref 0.61–1.24)
Chloride: 110 mmol/L (ref 101–111)
GFR calc Af Amer: 55 mL/min — ABNORMAL LOW (ref >60)
GFR, EST NON AFRICAN AMERICAN: 47 mL/min — AB (ref >60)
Glucose, Bld: 121 mg/dL — ABNORMAL HIGH (ref 65–99)
Potassium: 5.2 mmol/L — ABNORMAL HIGH (ref 3.5–5.1)
Sodium: 140 mmol/L (ref 135–145)
TOTAL PROTEIN: 4.7 g/dL — AB (ref 6.5–8.1)
Total Bilirubin: 0.7 mg/dL (ref 0.3–1.2)

## 2017-07-23 LAB — HEPATITIS PANEL, ACUTE
HCV Ab: <0.1 s/co ratio (ref 0.0–0.9)
Hep A IgM: NEGATIVE
Hep B C IgM: NEGATIVE
Hepatitis B Surface Ag: NEGATIVE

## 2017-07-23 LAB — CBC
HCT: 42.2 % (ref 39.0–52.0)
Hemoglobin: 13.7 g/dL (ref 13.0–17.0)
MCH: 30.8 pg (ref 26.0–34.0)
MCHC: 32.5 g/dL (ref 30.0–36.0)
MCV: 94.8 fL (ref 78.0–100.0)
PLATELETS: 145 10*3/uL — AB (ref 150–400)
RBC: 4.45 MIL/uL (ref 4.22–5.81)
RDW: 15.9 % — AB (ref 11.5–15.5)
WBC: 13.3 10*3/uL — AB (ref 4.0–10.5)

## 2017-07-23 MED ORDER — MORPHINE SULFATE (PF) 2 MG/ML IV SOLN
2.0000 mg | INTRAVENOUS | Status: DC | PRN
  Administered 2017-07-23 – 2017-07-24 (×2): 2 mg via INTRAVENOUS
  Filled 2017-07-23 (×2): qty 1

## 2017-07-23 MED ORDER — DILTIAZEM HCL-DEXTROSE 100-5 MG/100ML-% IV SOLN (PREMIX)
5.0000 mg/h | INTRAVENOUS | Status: DC
  Administered 2017-07-24: 5 mg/h via INTRAVENOUS
  Filled 2017-07-23: qty 100

## 2017-07-23 MED ORDER — SODIUM CHLORIDE 0.9 % IV SOLN
INTRAVENOUS | Status: DC
  Administered 2017-07-23 – 2017-07-24 (×2): via INTRAVENOUS

## 2017-07-23 NOTE — Progress Notes (Signed)
Medicine attending: I examined this patient today together with resident physicians Dr. Romelle Starcher Molt and Dr. Rochele Pages and I concur with her evaluation and management plan which we discussed together. Persistent diffuse abdominal pain and distention.  Tympanitic on palpation.  No vomiting.  Regular abdominal x-rays show multiple dilated loops of small bowel.  Extensive air in a nondistended colon.  Ileus versus small bowel obstruction. We need to stop his laxatives.  Put him at bowel rest.  IV hydration and electrolyte replacement.  Place NG tube to decompress if he starts to vomit or has persistent distention. Consider the possibility of a occult malignancy or bowel stricture/torsion in view of recurrent symptoms.  We may need to repeat the CT scan now that he has been cleaned out.

## 2017-07-23 NOTE — Progress Notes (Signed)
Subjective:  [name] notes significant lower abdominal pain today and hasn't had BMs since yesterday AM. He did refuse an enema and miralax yesterday but did take his miralax this morning.  He has been in AFib with RVR in the low 100's however has been steadily in the 140's-150's this afternoon. RN notified me of large emesis this afternoon.   Objective:  Vital signs in last 24 hours: Vitals:   07/23/17 0825 07/23/17 0829 07/23/17 1200 07/23/17 1437  BP:   (!) 109/92 (!) 152/110  Pulse:   (!) 101 (!) 130  Resp:   19   Temp:   98 F (36.7 C)   TempSrc:   Oral   SpO2: 98% 98% 98%   Weight:      Height:       General: Cachectic, chronically-ill male laying in bed. Looks uncomfortable but nontoxic.  HEENT: [place]/AT, EOMI, no scleral icterus Cardiac: Tachycardic to 120's. Irregular rhythm. No R/M/G appreciated Abd: Distended and tympanic throughout. +high-pitched bs Ext: extremities well perfused, no peripheral edema Neuro: alert and oriented X3, cranial nerves II-XII grossly intact  Assessment/Plan:  Principal Problem:   Constipation Active Problems:   Atrial fibrillation with rapid ventricular response (HCC)   Protein-calorie malnutrition, severe   Fecal impaction (HCC)   Acute kidney injury ([place])   DNR (do not resuscitate)   Regional colitis ([place])   Nausea vomiting and diarrhea  Constipation/?Ileus CT abdomen/pelvis on admit with large stool burden throughout the colon and in rectal vault. He responded well to enemas and laxatives with several large formed BMs with significant improvement in pain overnight 3/4 and into the morning 3/5. He had good appetite yesterday and seemed to tolerate his diet. Unfortunately he is quite distended and tympanic today on examination. RN recently reported to me large-volume emesis. I worry that [name] has developed an ileus. Ordering STAT KUB to look at bowel gas pattern, stool-burden or even free-air.   Going through his history, Mr.  [name] has had several admissions for "stercoral colitis" due to significant constipation and ileus over the past few years. His most recent colonoscopy appears to have been in 10/2010 for evaluation of thickened cecum. This was normal and follow-up colo was recommended in 10 years. He does take Percocet 10mg  Q4H prn regularly for control of his chronic pain and certainly opioids could be a key factor. I do see that [name] previously prescribed him Movantik in the past, unsure why this is no longer prescribed.   -Regress diet to clears -Will give some gentle IVF for maintenance while poor PO intake -Hold aggressive bowel regimen in setting of possible ileus -Follow-up KUB -Discuss with patient his response to Movantik tomorrow, could benefit -Given his recurrent constipation and ileus, could consider GI consult for diagnostic colonoscopy. While no polyp was appreciated on his last colo and no overt mass seen on CT, direct visualization of colon and rectum may be of benefit in this patient with recurrent presentations  Atrial Fibrillation with Rapid Ventricular Response Initially with rates in the 150's on admission but responded with fluid resuscitation and carzidem infusion. He was able to be weaned off and maintained good rate control yesterday on home medications. He has been persistently and increasingly tachycardic, now with rates sustained in the 130's-140's. I initially attributed his increased tachycardia to his complaint of significant abdominal pain however dose of Morphine 2mg  without improvement, actually has had rates recorded up to 167. His blood pressures are stable but a degree  of dehydration could be contributing as well. I believe patient would benefit from restarting Cardizem infusion and transfer back to SDU status.  -Continue close monitoring of HD status, appreciate RN -Restart Cardizem infusion given rates continue to be >130 for several hours, spiking as high as 160's. Wean as  able. -Back to SDU status -Continue home Metoprolol and Diltiazem  -Continue home Eliquis 2.5mg  BID  Elevated AST, ALT Unclear etiology with slight improvement today. Acute hepatitis panel resulted negative.  Liver and gallbladder unremarkable on imaging, only trace intrahepatic biliary ductal dilatation without evidence of radiopaque stones. He has not had any significant hypotension to suggest shock liver. Perhaps related to his significant constipation? We will follow this.   Chronic Respiratory Failure, End-Stage COPD Saturating well on his home 2L of oxygen. Lungs are clear and he is not in respiratory distress. Notes compliance with antibiotic and steroid course outpatient with improved symptoms.  -Continue supplemental O2 as needed -Continue home Spiriva and prn albuterol  Chronic Kidney Disease, Stage III Hyperkalemia Cr 1.4 today, 1.5 on admit. BL variable but seems to be around 1.3. K just barely elevated at 5.2 today.  -Will continue to monitor renal function with AM Cr and lytes  Chronic Combined Systolic and Diastolic Congestive Heart Failure He is not volume overloaded on exam. He received some IVF on admission but overall is only net positive 435mLs since admit. I suspect he was dehydrated on admission.  -Will continue to monitor volume status closely -He is getting gentle IVF while he is having little PO intake -He is not on diuretic at home or during hospitalization  Severe Malnutrition BMI 17%. He has a poor appetite chronically and is on Marinol at home. He is currently on an extended course of steroids for COPD exacerbation at patient request to increase his appetite. He uses Boost shakes at home which I am currently holding but will be resumed as patients GI issues improve.  History of Depression, Bipolar 1 Disorder Mood stable. -Continue Wellbutrin 100 mg BID  Diet: Clears IVF: NS @ 150mls/hr x15 hours DVT ppx: On Eliquis 2.5mg  BID Code status:   DNR  Dispo: Anticipated discharge in approximately 1-2 day(s).   Travis Daudelin, DO 07/23/2017, 3:21 PM Pager: [phone]

## 2017-07-24 ENCOUNTER — Inpatient Hospital Stay (HOSPITAL_COMMUNITY): Payer: Medicare Other

## 2017-07-24 ENCOUNTER — Telehealth: Payer: Self-pay | Admitting: *Deleted

## 2017-07-24 LAB — COMPREHENSIVE METABOLIC PANEL
ALT: 61 U/L (ref 17–63)
ANION GAP: 8 (ref 5–15)
AST: 31 U/L (ref 15–41)
Albumin: 2.6 g/dL — ABNORMAL LOW (ref 3.5–5.0)
Alkaline Phosphatase: 38 U/L (ref 38–126)
BUN: 30 mg/dL — ABNORMAL HIGH (ref 6–20)
CHLORIDE: 106 mmol/L (ref 101–111)
CO2: 25 mmol/L (ref 22–32)
Calcium: 8.4 mg/dL — ABNORMAL LOW (ref 8.9–10.3)
Creatinine, Ser: 1.27 mg/dL — ABNORMAL HIGH (ref 0.61–1.24)
GFR calc non Af Amer: 54 mL/min — ABNORMAL LOW (ref >60)
Glucose, Bld: 89 mg/dL (ref 65–99)
POTASSIUM: 5.4 mmol/L — AB (ref 3.5–5.1)
SODIUM: 139 mmol/L (ref 135–145)
Total Bilirubin: 1 mg/dL (ref 0.3–1.2)
Total Protein: 4.7 g/dL — ABNORMAL LOW (ref 6.5–8.1)

## 2017-07-24 MED ORDER — IOPAMIDOL (ISOVUE-300) INJECTION 61%
INTRAVENOUS | Status: AC
  Filled 2017-07-24: qty 30

## 2017-07-24 MED ORDER — SODIUM CHLORIDE 0.9 % IV SOLN
INTRAVENOUS | Status: AC
  Administered 2017-07-24: 07:00:00 via INTRAVENOUS

## 2017-07-24 NOTE — Telephone Encounter (Signed)
Patient called to cancel appt at [facility] for tomorrow as he is currently hospitalized. Requesting hospital visit from PCP. Hubbard Hartshorn, RN, BSN

## 2017-07-24 NOTE — Progress Notes (Signed)
Pt was NPO status, patient didn't understand why he was NPO, he wanted to have food. Explained why he need NPO status, but he didn't want to listen then wanted to talk to doctor. Informed pt there was order for CT of abdomen then will decide for diet. Pt refused to drink PO contrast for CT of abdomen, he wanted to talk to doctor.  Other nurse gave him po contrast and he took it, but he was upset for second dose of po contrast. Dr. Aggie Hacker notified this matter. Ordered just taking him for CT of abdomen as soon as possible. He took 2nd dose then went down to CT. Advanced diet to clear diet, but pt wanted to have two packs of crackers at least or food, he was upset and wanted to talk to doctor. IMTS notified this matter and it was okay to give two packs of crackers. Pt was not happy because he asked more crackers. HS [employer]

## 2017-07-24 NOTE — Progress Notes (Signed)
Subjective:  Patient seen and examined. No acute events overnight. Patient's abdomdinal pain has improved. He denies nausea or vomiting, and has been passing gas. No bowel movement. He is requesting a full diet. Discussed with patient we will repeat imaging of his abdomen and if no obstruction is seen we will advance his diet. He is not amenable to this plan and is very insistent about eating.   Objective:  Vital signs in last 24 hours: Vitals:   07/24/17 0700 07/24/17 0742 07/24/17 0805 07/24/17 0912  BP:   118/82   Pulse:    92  Resp:      Temp: 97.9 F (36.6 C)     TempSrc: Oral     SpO2:  99%    Weight:      Height:       General: Cachectic, chronically-ill male laying in bed. NAD, non-toxic appearing HEENT: [place]/AT, EOMI, no scleral icterus Cardiac: regular rate, Irregular rhythm. No R/M/G appreciated Abd: mild distention, improved from yesterday, and tympanic in lower quadrants; bowel sounds reduced  Ext: extremities well perfused, no peripheral edema Neuro: alert and oriented X3, cranial nerves II-XII grossly intact  Assessment/Plan:  Principal Problem:   Constipation Active Problems:   Atrial fibrillation with rapid ventricular response (HCC)   Ileus (HCC)   Protein-calorie malnutrition, severe   Fecal impaction (HCC)   Acute kidney injury ([place])   DNR (do not resuscitate)   Regional colitis ([place])   Nausea vomiting and diarrhea  Constipation/?Ileus CT abdomen/pelvis on admit with large stool burden throughout the colon and in rectal vault. He responded well to enemas and laxatives with several large formed BMs with significant improvement and was tolerating diet, until yesterday when he developed abdominal pain and distention. KUB demonstrated lultiple dilated loops of small bowel, extensive air in the nondistended colon concerning for ileus vs SBO. Abdominal exam today improved, mildly distended in lower quadrants, decreased bowel sounds, no TTP.  -Bowel rest, NPO  (sips with meds) -Will give some gentle IVF for maintenance  -Hold aggressive bowel regimen in setting of possible ileus -Repeat CT abdomen/pelvis with oral contrast to evaluate for mass/stricture as patient has had recurrent episodes of constipation/ileus - will follow up results -NG tube placement if vomiting develops or if CT abdomen shows SBO  Atrial Fibrillation with Rapid Ventricular Response Patient was persistently and increasingly tachycardic yesterday with rates sustained in the 130's-140's. Cardizem infusion was restarted. Heart rates again improved overnight, ranging 70-80s.   -Will stop Cardizem infusion with close monitoring of HD status, appreciate RN -Continue home Metoprolol and Diltiazem  -Continue home Eliquis 2.5mg  BID  Elevated AST, ALT Resolved, AST 31, ALT 61. Unclear etiology. Acute hepatitis panel resulted negative.  Liver and gallbladder unremarkable on imaging, only trace intrahepatic biliary ductal dilatation without evidence of radiopaque stones. He has not had any significant hypotension to suggest shock liver. Perhaps related to his significant constipation?   Chronic Respiratory Failure, End-Stage COPD Saturating well on his home 2L of oxygen. Lungs are clear and he is not in respiratory distress. Notes compliance with antibiotic and steroid course outpatient with improved symptoms.  -Continue supplemental O2 as needed -Continue home Spiriva and prn albuterol  Chronic Kidney Disease, Stage III Hyperkalemia Cr 1.27 today, 1.5 on admit. BL variable but seems to be around 1.3. K mildlt elevated at 5.4 today.  -Will continue to monitor renal function with AM Cr and lytes  Chronic Combined Systolic and Diastolic Congestive Heart Failure He is not volume  overloaded on exam. He received some IVF on admission but overall is only net positive 438mLs since admit. I suspect he was dehydrated on admission.  -Will continue to monitor volume status closely -He is  getting gentle IVF while he is having little PO intake -He is not on diuretic at home or during hospitalization  Severe Malnutrition BMI 17%. He has a poor appetite chronically and is on Marinol at home. He is currently on an extended course of steroids for COPD exacerbation at patient request to increase his appetite. He uses Boost shakes at home which I am currently holding but will be resumed as patients GI issues improve.  History of Depression, Bipolar 1 Disorder Mood stable. -Continue Wellbutrin 100 mg BID  Diet: NPO IVF: NS @ 23mls/hr x15 hours DVT ppx: On Eliquis 2.5mg  BID Code status:  DNR  Dispo: Anticipated discharge in approximately 1-2 day(s).   Travis Spry, MD 07/24/2017, 9:26 AM Pager: [phone]

## 2017-07-24 NOTE — Care Management Note (Signed)
Case Management Note  Patient Details  Name: [name] MRN: [id number] Date of Birth: 03-May-1946  Subjective/Objective:   Pt admitted with constipation                  Action/Plan:  From home alone, w/chair bound, gets meals on wheels on home oxygen 3 liters prn with [facility],.Pt active with [facility] for HHPT - agency aware of admit.  CM will request [place] resumption orders.    Expected Discharge Date:  07/23/17               Expected Discharge Plan:  [place]  In-House Referral:     Discharge planning Services  CM Consult  Post Acute Care Choice:    Choice offered to:  Patient  DME Arranged:    DME Agency:     HH Arranged:  PT [place]:  [place]  Status of Service:     If discussed at [place] of Stay Meetings, dates discussed:    Additional Comments:  Maryclare Labrador, RN 07/24/2017, 3:41 PM

## 2017-07-24 NOTE — Telephone Encounter (Signed)
Met with Mr. Harrower in the hospital. Encouraged him to continue to work with the primary team providing appropriate ongoing care.

## 2017-07-24 NOTE — Telephone Encounter (Signed)
Requesting Helen to call back.  

## 2017-07-24 NOTE — Progress Notes (Signed)
Medicine attending: I examined this patient today together with resident physician Dr. Rochele Pages and I concur with her evaluation and management plan which we discussed together. He has improved.  Abdomen much less distended.  Nontender.  He is passing gas.  He is hungry and wants to eat.  In view of recurrent episodes of constipation/obstipation, will going to repeat a CAT scan to exclude any focal mass or stricture of his colon. Heart rate now controlled on parenteral Cardizem.  Resume oral meds once all danger of bowel obstruction has resolved.

## 2017-07-24 NOTE — Progress Notes (Signed)
Paged by nursing that the patient is upset and angry, and refusing second bottle of oral contrast. Went to bedside. Patient states he is upset and that we "lied to him." He does not understand why he wasn't able to drink the oral contrast earlier or get his scan earlier. Tried to explain that the there is schedule for the CT scanner and the contrast needs to be timed according to the time of the scan. He did not understand this concept and states that he does not like being lied to. He also continued to complain about not being able to eat. I discussed the risks of eating if he does have an obstruction and that is why were are getting a another image of his abdomen. Dicussed the importance of the contrast and recommended he drink the second bottle.   Plan: I greatly appreciate nursing's help with this patient. Discussed with the patient's nurse if he refuses the second bottle of contrast that is fine. Also asked her to call down to radiology if we can move up his scan, he is currently on the schedule for 1-2 pm.   Arvil Chaco, MD Internal Medicine PGY1 Pager # [phone]

## 2017-07-24 NOTE — Telephone Encounter (Signed)
Called and visited

## 2017-07-25 ENCOUNTER — Ambulatory Visit: Payer: Medicare Other

## 2017-07-25 LAB — COMPREHENSIVE METABOLIC PANEL
ALK PHOS: 39 U/L (ref 38–126)
ALT: 49 U/L (ref 17–63)
AST: 33 U/L (ref 15–41)
Albumin: 2.7 g/dL — ABNORMAL LOW (ref 3.5–5.0)
Anion gap: 9 (ref 5–15)
BUN: 28 mg/dL — AB (ref 6–20)
CALCIUM: 8.4 mg/dL — AB (ref 8.9–10.3)
CO2: 23 mmol/L (ref 22–32)
CREATININE: 1.18 mg/dL (ref 0.61–1.24)
Chloride: 106 mmol/L (ref 101–111)
GFR, EST NON AFRICAN AMERICAN: 59 mL/min — AB (ref >60)
Glucose, Bld: 117 mg/dL — ABNORMAL HIGH (ref 65–99)
Potassium: 5.2 mmol/L — ABNORMAL HIGH (ref 3.5–5.1)
Sodium: 138 mmol/L (ref 135–145)
Total Bilirubin: 1 mg/dL (ref 0.3–1.2)
Total Protein: 5 g/dL — ABNORMAL LOW (ref 6.5–8.1)

## 2017-07-25 LAB — CBC
HEMATOCRIT: 43 % (ref 39.0–52.0)
HEMOGLOBIN: 14.1 g/dL (ref 13.0–17.0)
MCH: 31.3 pg (ref 26.0–34.0)
MCHC: 32.8 g/dL (ref 30.0–36.0)
MCV: 95.3 fL (ref 78.0–100.0)
Platelets: 144 10*3/uL — ABNORMAL LOW (ref 150–400)
RBC: 4.51 MIL/uL (ref 4.22–5.81)
RDW: 15.9 % — ABNORMAL HIGH (ref 11.5–15.5)
WBC: 10.9 10*3/uL — ABNORMAL HIGH (ref 4.0–10.5)

## 2017-07-25 MED ORDER — POLYETHYLENE GLYCOL 3350 17 G PO PACK
17.0000 g | PACK | Freq: Every day | ORAL | Status: DC
  Administered 2017-07-25 – 2017-07-27 (×3): 17 g via ORAL
  Filled 2017-07-25 (×3): qty 1

## 2017-07-25 MED ORDER — MAGNESIUM CITRATE PO SOLN
1.0000 | Freq: Every day | ORAL | Status: DC | PRN
  Filled 2017-07-25: qty 296

## 2017-07-25 MED ORDER — SENNOSIDES-DOCUSATE SODIUM 8.6-50 MG PO TABS
1.0000 | ORAL_TABLET | Freq: Two times a day (BID) | ORAL | Status: DC
  Administered 2017-07-25 – 2017-07-27 (×5): 1 via ORAL
  Filled 2017-07-25 (×5): qty 1

## 2017-07-25 NOTE — Care Management Important Message (Signed)
Important Message  Patient Details  Name: [name] MRN: [id number] Date of Birth: 1945-11-11   Medicare Important Message Given:  Yes    Barb Merino Tiffane Sheldon 07/25/2017, 2:56 PM

## 2017-07-25 NOTE — Progress Notes (Signed)
Medicine attending: I examined this patient today together with resident physician Dr. Rochele Pages and I concur with her evaluation and management plan which we discussed together. Patient agitated today.  Does not feel we are giving him his test results back fast enough or updating him on his diet changes. He came in on March 4 with an impending bowel obstruction.  This is found to be secondary to constipation.  He had a good result with hydration, laxatives, and enemas.  His abdomen decompressed.  We repeated a CT scan to exclude possibility of an underlying obstructing mass or stricture.  We reviewed the images with him at the bedside.  Currently no evidence for obstruction.  There is still some dilated loops of large bowel and some residual stool.  No obvious colonic mass.  I tried to explain to the patient that in view of his advanced cardiopulmonary disease we are trying to avoid invasive procedures such as colonoscopy which would require sedation and anesthesia which he may not be able to tolerate.  He settled down with this explanation. Abdomen today is soft, nontender, no mass, no organomegaly, decreased bowel sounds. Rate of his chronic atrial fibrillation back under control with transition back to his oral antiarrhythmics. He is now refusing to leave the hospital until he has another bowel movement.  We will give additional laxatives and stool softeners today. For problem list see Dr.LaCroce's note.

## 2017-07-25 NOTE — Progress Notes (Signed)
Subjective:  Patient seen and examined. No acute events overnight. Patient's abdomdinal pain has improved. He was able to tolerate breakfast. Patient does not want to leave the hospital until he has a bowel movement. Discussed CT scan results with him and the reason for slow advancement in his diet.   Objective:  Vital signs in last 24 hours: Vitals:   07/25/17 0359 07/25/17 0720 07/25/17 0800 07/25/17 1128  BP: 111/76  119/81 (!) 122/104  Pulse: 74  82 (!) 105  Resp: (!) 28  20 17   Temp: 98 F (36.7 C)   98.6 F (37 C)  TempSrc: Oral   Oral  SpO2: 98% 96% 100% 100%  Weight:      Height:       General: Cachectic, chronically-ill male laying in bed. NAD, non-toxic appearing HEENT: [place]/AT, EOMI, no scleral icterus Cardiac: regular rate, Irregular rhythm. No R/M/G appreciated Abd: mild distention, tympanic in lower quadrants; bowel sounds normal Ext: extremities well perfused, no peripheral edema Neuro: alert and oriented X3, cranial nerves II-XII grossly intact  Assessment/Plan:  Principal Problem:   Constipation Active Problems:   Atrial fibrillation with rapid ventricular response (HCC)   Ileus (HCC)   Protein-calorie malnutrition, severe   Fecal impaction (HCC)   Acute kidney injury ([place])   DNR (do not resuscitate)   Regional colitis ([place])   Nausea vomiting and diarrhea  Constipation/?Ileus Repeat CT abdomen and pelvis without evidence of bowel obstruction, mildly dilated, gas-filled loops of nondependent colon, raising thepossibility of adynamic colonic ileus. Mild rectal stool burden, significantly improved from the prior. Patient thus far has tolerated breakfast. Pending ability to have bowel movement may be able to discharge later today.  -Will continue bowel regimen of miralax and senokot  -Mag citrate daily PRN for severe constipation  Atrial Fibrillation with Rapid Ventricular Response Rates have been stable on PO Cardizem and metoprolol, HR 75-100  -Will  transfer out of step down to telemerty -Continue home Metoprolol and Diltiazem  -Continue home Eliquis 2.5mg  BID  Elevated AST, ALT Resolved. Unclear etiology. Acute hepatitis panel resulted negative.  Liver and gallbladder unremarkable on imaging, only trace intrahepatic biliary ductal dilatation without evidence of radiopaque stones. He has not had any significant hypotension to suggest shock liver. Perhaps related to his significant constipation?   Chronic Respiratory Failure, End-Stage COPD Saturating well on his home 2L of oxygen. Lungs are clear and he is not in respiratory distress. Notes compliance with antibiotic and steroid course outpatient with improved symptoms.  -Continue supplemental O2 as needed -Continue home Spiriva and prn albuterol  Chronic Kidney Disease, Stage III Hyperkalemia Cr 1.18 today, 1.5 on admit. BL variable but seems to be around 1.3. K mildlt elevated at 5.4 today.  -Will continue to monitor renal function with AM Cr and lytes  Chronic Combined Systolic and Diastolic Congestive Heart Failure Euvolemic on examination. Net negative since admission. -Will continue to monitor I/Os  Severe Malnutrition BMI 17%. He has a poor appetite chronically and is on Marinol at home. He is currently on an extended course of steroids for COPD exacerbation at patient request to increase his appetite. He uses Boost shakes at home which I am currently holding but will be resumed as patients GI issues improve.  History of Depression, Bipolar 1 Disorder Mood stable. -Continue Wellbutrin 100 mg BID  Diet: regular IVF: none DVT ppx: On Eliquis 2.5mg  BID Code status:  DNR  Dispo: Anticipated discharge in approximately 0-1 day(s).   Melanee Spry,  MD 07/25/2017, 12:04 PM Pager: [phone]

## 2017-07-26 DIAGNOSIS — Z9981 Dependence on supplemental oxygen: Secondary | ICD-10-CM

## 2017-07-26 DIAGNOSIS — Z681 Body mass index (BMI) 19 or less, adult: Secondary | ICD-10-CM

## 2017-07-26 DIAGNOSIS — K567 Ileus, unspecified: Secondary | ICD-10-CM

## 2017-07-26 DIAGNOSIS — E46 Unspecified protein-calorie malnutrition: Secondary | ICD-10-CM

## 2017-07-26 DIAGNOSIS — F3170 Bipolar disorder, currently in remission, most recent episode unspecified: Secondary | ICD-10-CM

## 2017-07-26 LAB — BASIC METABOLIC PANEL
Anion gap: 10 (ref 5–15)
BUN: 30 mg/dL — AB (ref 6–20)
CALCIUM: 8.7 mg/dL — AB (ref 8.9–10.3)
CO2: 24 mmol/L (ref 22–32)
Chloride: 109 mmol/L (ref 101–111)
Creatinine, Ser: 1.32 mg/dL — ABNORMAL HIGH (ref 0.61–1.24)
GFR calc Af Amer: >60 mL/min (ref >60)
GFR calc non Af Amer: 52 mL/min — ABNORMAL LOW (ref >60)
GLUCOSE: 145 mg/dL — AB (ref 65–99)
Potassium: 4.8 mmol/L (ref 3.5–5.1)
Sodium: 143 mmol/L (ref 135–145)

## 2017-07-26 MED ORDER — MILK AND MOLASSES ENEMA
1.0000 | Freq: Once | RECTAL | Status: AC
  Administered 2017-07-27: 250 mL via RECTAL
  Filled 2017-07-26: qty 250

## 2017-07-26 MED ORDER — MILK AND MOLASSES ENEMA
1.0000 | Freq: Once | RECTAL | Status: DC | PRN
  Filled 2017-07-26: qty 250

## 2017-07-26 MED ORDER — LACTULOSE 10 GM/15ML PO SOLN
30.0000 g | Freq: Once | ORAL | Status: AC
  Administered 2017-07-26: 30 g via ORAL
  Filled 2017-07-26: qty 45

## 2017-07-26 NOTE — Progress Notes (Signed)
Subjective:  Patient seen and examined. No acute events overnight. Patient denies nausea, vomiting, or abdominal pain. He has not had a bowel movement. He was able to tolerate a regular diet yesterday. He did take miralax and the senokot. He states his breathing is at baseline, but is upset because the staff overnight would not let him wear oxygen because he was saturating 100%.   Patient states he is willing to have an enema this afternoon. I discussed the option of lactulose with him before trying the enema. He is willing to try. If lactulose does not improve his constipation we will try enema. He is aggreable with this plan.   Objective:  Vital signs in last 24 hours: Vitals:   07/25/17 2252 07/26/17 0000 07/26/17 0352 07/26/17 0637  BP: 126/88  (!) 137/91 103/82  Pulse: (!) 126 (!) 101 93   Resp: 13 13 15    Temp: 97.9 F (36.6 C)  97.7 F (36.5 C)   TempSrc: Oral  Oral   SpO2: 100% 100% 100%   Weight:   142 lb 3.2 oz (64.5 kg)   Height:       General: Cachectic, chronically-ill male laying in bed. NAD, non-toxic appearing HEENT: [place]/AT, EOMI, no scleral icterus Cardiac: regular rate, Irregular rhythm. No R/M/G appreciated Abd: soft, minimal distention; bowel sounds normal Ext: extremities well perfused, no peripheral edema Neuro: alert and oriented X3, cranial nerves II-XII grossly intact   Assessment/Plan:  Principal Problem:   Constipation Active Problems:   Atrial fibrillation with rapid ventricular response (HCC)   Ileus (HCC)   Protein-calorie malnutrition, severe   Fecal impaction (HCC)   Acute kidney injury ([place])   DNR (do not resuscitate)   Regional colitis ([place])   Nausea vomiting and diarrhea  Constipation/Ileus Repeat CT abdomen and pelvis without evidence of bowel obstruction, mildly dilated, gas-filled loops of nondependent colon, raising the possibility of a dynamic colonic ileus. Mild rectal stool burden, significantly improved from the prior. Patient  thus far has tolerated a regular diet. His discharge is pending a bowel movement as he is high risk of developing ileus, nausea, and vomiting.  -Will continue bowel regimen of miralax and senokot   -Adding lactulose today to be taken around lunch time per patient request - please administer when patient would like to take it -Will try enema if lactulose is unsuccessful -Patient will need adjustments to his bowel regimen once discharged to prevent recurrence, may also benefit from follow up with GI as outpatient  Atrial Fibrillation with Rapid Ventricular Response Rates have been stable on PO Cardizem and metoprolol, HR 75-100  -Will transfer out of step down to telemerty -Continue home Metoprolol and Diltiazem  -Continue home Eliquis 2.5mg  BID  Chronic Respiratory Failure, End-Stage COPD Saturating well on room air. He uses 2L of oxygen at home PRN. Lungs are clear and he is not in respiratory distress.  -Continue supplemental O2 as needed - if patient requests supplemental oxygen please allow this for comfort if O2 sats are normal  -Continue home Spiriva and prn albuterol  Chronic Kidney Disease, Stage III Hyperkalemia Cr 1.32 today. BL variable but seems to be around 1.3. Hyperkalemia resolved today, K level 4.8. -Will continue to monitor renal function with AM Cr and lytes  Chronic Combined Systolic and Diastolic Congestive Heart Failure Euvolemic on examination. Net negative since admission. -Will continue to monitor I/Os  Severe Malnutrition BMI 17%. He has a poor appetite chronically and is on Marinol at home. He is  currently on an extended course of steroids for COPD exacerbation at patient request to increase his appetite. -Continue supplemental boost shakes   History of Depression, Bipolar 1 Disorder Mood stable. -Continue Wellbutrin 100 mg BID   Diet: regular IVF: none DVT ppx: On Eliquis 2.5mg  BID Code status:  DNR  Dispo: Anticipated discharge in approximately  0-1 day(s).   Melanee Spry, MD 07/26/2017, 8:13 AM Pager: [phone]

## 2017-07-26 NOTE — Discharge Instructions (Signed)

## 2017-07-27 DIAGNOSIS — Z880 Allergy status to penicillin: Secondary | ICD-10-CM

## 2017-07-27 DIAGNOSIS — Z888 Allergy status to other drugs, medicaments and biological substances status: Secondary | ICD-10-CM

## 2017-07-27 DIAGNOSIS — Z7951 Long term (current) use of inhaled steroids: Secondary | ICD-10-CM

## 2017-07-27 DIAGNOSIS — Z91048 Other nonmedicinal substance allergy status: Secondary | ICD-10-CM

## 2017-07-27 LAB — BASIC METABOLIC PANEL
ANION GAP: 9 (ref 5–15)
BUN: 31 mg/dL — ABNORMAL HIGH (ref 6–20)
CO2: 21 mmol/L — AB (ref 22–32)
Calcium: 8.6 mg/dL — ABNORMAL LOW (ref 8.9–10.3)
Chloride: 111 mmol/L (ref 101–111)
Creatinine, Ser: 1.3 mg/dL — ABNORMAL HIGH (ref 0.61–1.24)
GFR calc Af Amer: >60 mL/min (ref >60)
GFR, EST NON AFRICAN AMERICAN: 53 mL/min — AB (ref >60)
GLUCOSE: 98 mg/dL (ref 65–99)
Potassium: 5.1 mmol/L (ref 3.5–5.1)
Sodium: 141 mmol/L (ref 135–145)

## 2017-07-27 MED ORDER — POLYETHYLENE GLYCOL 3350 17 G PO PACK: 17.0000 g | PACK | Freq: Two times a day (BID) | ORAL | Status: DC

## 2017-07-27 MED ORDER — SENNOSIDES-DOCUSATE SODIUM 8.6-50 MG PO TABS: 2.0000 | ORAL_TABLET | Freq: Two times a day (BID) | ORAL | 5 refills | Status: DC

## 2017-07-27 MED ORDER — OXYCODONE-ACETAMINOPHEN 10-325 MG PO TABS: 1.0000 | ORAL_TABLET | ORAL | 0 refills | Status: DC | PRN

## 2017-07-27 MED ORDER — POLYETHYLENE GLYCOL 3350 17 G PO PACK: 17.0000 g | PACK | Freq: Two times a day (BID) | ORAL | 0 refills | Status: DC

## 2017-07-27 MED ORDER — DILTIAZEM HCL ER COATED BEADS 120 MG PO CP24: 120.0000 mg | ORAL_CAPSULE | Freq: Every day | ORAL | 0 refills | Status: DC

## 2017-07-27 MED ORDER — SENNOSIDES-DOCUSATE SODIUM 8.6-50 MG PO TABS: 2.0000 | ORAL_TABLET | Freq: Two times a day (BID) | ORAL | Status: DC

## 2017-07-27 NOTE — Progress Notes (Signed)
   Subjective:  Patient seen and examined. No acute events overnight. Patient denies nausea, vomiting, or abdominal pain. He had a large bowel movement this morning. States he feels much better. He is agreeable with discharge today.   Objective:  Vital signs in last 24 hours: Vitals:   07/26/17 2357 07/27/17 0000 07/27/17 0400 07/27/17 0551  BP: 112/86   (!) 130/107  Pulse:      Resp: 20 19 14  (!) 25  Temp: 98.1 F (36.7 C)   98.2 F (36.8 C)  TempSrc: Oral   Oral  SpO2:      Weight:      Height:       General: Cachectic, sitting up ion chair eating breakfast, NAD HEENT: [place]/AT, no scleral icterus Cardiac: regular rate, Irregular rhythm. No R/M/G appreciated Abd: soft, bowel sounds normal, no distention Ext: extremities well perfused, no peripheral edema Neuro: alert and oriented X3, cranial nerves II-XII grossly intact   Assessment/Plan:  Principal Problem:   Constipation Active Problems:   Atrial fibrillation with rapid ventricular response (HCC)   Ileus (HCC)   Protein-calorie malnutrition, severe   Fecal impaction (HCC)   Acute kidney injury ([place])   DNR (do not resuscitate)   Regional colitis ([place])   Nausea vomiting and diarrhea  Constipation/Ileus Patient able to tolerate regular diet without nausea or vomiting. After lactulose and enema patient was able to have a large BM this morning.  -Will adjust bowel regimen for discharge to avoid constipation in setting of chronic opioid use -May benefit from follow up with GI as outpatient as this has been a recurrent problem   Atrial Fibrillation with Rapid Ventricular Response Rates have been stable on PO Cardizem and metoprolol, HR 75-100  -Continue home Metoprolol and Diltiazem  -Continue home Eliquis 2.5mg  BID  Chronic Respiratory Failure, End-Stage COPD Saturating well on room air. He uses 2L of oxygen at home PRN. Lungs CTAB. Respiratory stats has been stable the duration of the admission. -Continue  supplemental O2 as needed - if patient requests supplemental oxygen please allow this for comfort if O2 sats are normal  -Continue home Spiriva and prn albuterol  Chronic Kidney Disease, Stage III Creatinine stable and at baseline.   Chronic Combined Systolic and Diastolic Congestive Heart Failure Euvolemic on examination. Net negative since admission. -Will continue to monitor I/Os  Severe Malnutrition BMI 17%. He has a poor appetite chronically and is on Marinol at home. He is currently on an extended course of steroids for COPD exacerbation at patient request to increase his appetite. -Continue supplemental boost shakes   History of Depression, Bipolar 1 Disorder Mood stable. -Continue Wellbutrin 100 mg BID  Diet: regular IVF: none DVT ppx: On Eliquis 2.5mg  BID Code status:  DNR  Dispo: Anticipated discharge today.   Melanee Spry, MD 07/27/2017, 6:21 AM Pager: [phone]

## 2017-07-27 NOTE — Progress Notes (Signed)
Discharge instructions gone over with patient and patient's daughter.  Both stated that they understood and did not have any questions.  IV discontinued.  Patient is ready for discharge.  07/27/2017 1500 Tilda [address] [place]

## 2017-07-27 NOTE — Discharge Summary (Signed)
Name: [name] MRN: [id number] DOB: [birth year] 72 y.o. PCP: Zada Finders, MD  Date of Admission: 07/21/2017 12:58 PM Date of Discharge: 07/27/2016 Attending Physician: Murriel Hopper, MD  Discharge Diagnosis: 1. Constipation Principal Problem:   Constipation Active Problems:   Atrial fibrillation with rapid ventricular response (HCC)   Ileus (HCC)   Protein-calorie malnutrition, severe   Fecal impaction (HCC)   Acute kidney injury ([place])   DNR (do not resuscitate)   Regional colitis ([place])   Nausea vomiting and diarrhea   Discharge Medications: Allergies as of 07/27/2017      Reactions   Amiodarone Other (See Comments)   Thyroiditis in the setting of amiodarone use   Tape Itching   Reaction to plastic tape - please use paper tape   Penicillins Rash, Other (See Comments)   Has patient had a PCN reaction causing immediate rash, facial/tongue/throat swelling, SOB or lightheadedness with hypotension: Yes Has patient had a PCN reaction causing severe rash involving mucus membranes or skin necrosis: No Has patient had a PCN reaction that required hospitalization No Has patient had a PCN reaction occurring within the last 10 years: No If all of the above answers are "NO", then may proceed with Cephalosporin use.      Medication List    TAKE these medications   albuterol 108 (90 Base) MCG/ACT inhaler Commonly known as:  VENTOLIN HFA Inhale 2 puffs into the lungs every 6 (six) hours as needed for wheezing or shortness of breath.   apixaban 2.5 MG Tabs tablet Commonly known as:  ELIQUIS Take 1 tablet (2.5 mg total) by mouth 2 (two) times daily.   aspirin 81 MG chewable tablet Chew 1 tablet (81 mg total) by mouth daily.   buPROPion 100 MG 12 hr tablet Commonly known as:  WELLBUTRIN SR Take 1 tablet (100 mg total) by mouth 2 (two) times daily.   diltiazem 120 MG 24 hr capsule Commonly known as:  CARDIZEM CD Take 1 capsule (120 mg total) by mouth daily.     dronabinol 10 MG capsule Commonly known as:  MARINOL Take 10 mg by mouth 2 (two) times daily before a meal.   ENSURE Take 237 mLs by mouth 2 (two) times daily. What changed:  Another medication with the same name was removed. Continue taking this medication, and follow the directions you see here.   metoprolol succinate 50 MG 24 hr tablet Commonly known as:  TOPROL-XL Take 1 tablet (50 mg total) by mouth every 12 (twelve) hours. Take with or immediately following a meal.   oxyCODONE-acetaminophen 10-325 MG tablet Commonly known as:  PERCOCET Take 1 tablet by mouth every 4 (four) hours as needed for pain. Do not take more than 5 tablets in 24 hours. Start taking on:  07/29/2017 What changed:  These instructions start on 07/29/2017. If you are unsure what to do until then, ask your doctor or other care provider.   polyethylene glycol packet Commonly known as:  MIRALAX / GLYCOLAX Take 17 g by mouth 2 (two) times daily. What changed:    when to take this  reasons to take this   predniSONE 10 MG tablet Commonly known as:  DELTASONE Take 1 tablet (10 mg total) by mouth daily with breakfast for 10 days. What changed:  additional instructions   predniSONE 20 MG tablet Commonly known as:  DELTASONE Take 1 tablet (20 mg total) by mouth daily with breakfast for 10 days. What changed:    when to take this  additional instructions   rosuvastatin 20 MG tablet Commonly known as:  CRESTOR TAKE 1 TABLET EVERY DAY AT 6 PM What changed:    how much to take  how to take this  when to take this  additional instructions   senna-docusate 8.6-50 MG tablet Commonly known as:  Senokot-S Take 2 tablets by mouth 2 (two) times daily. What changed:    how much to take  when to take this   tiotropium 18 MCG inhalation capsule Commonly known as:  SPIRIVA Place 1 capsule (18 mcg total) into inhaler and inhale daily. What changed:  Another medication with the same name was removed.  Continue taking this medication, and follow the directions you see here.       Disposition and follow-up:   Mr.Nahsir F Baty was discharged from [facility] in Stable condition.  At the hospital follow up visit please address:  1.   Constipation/Ileus -Discharged with new instructions for bowel regimen: Miralax BID, with titration to TID if necessary; senokot 2 tablets BID -Please address if the patient is adhering to this bowel regimen -Escalate or change regimen as needed to prevent constipation as patient is high risk with chronic opioid use  -If patient desires please help facilitate an appointment with [facility]   Chronic Atrial Fibrillation on anticoagulation, Atrial fibrillation complicated by RVR -Discharged with same rate control regimen: Diltiazem 120 mg daily and Metoprolol 50 mg BID -Continue Eliquis 2.5 mg BID  End Stage COPD -Patient had no new oxygen requirement, continued with 2 liter supplement O2 PRN -Continue Spiriva  -Continue albuterol as needed  Malnutrition -Continue Ensure shakes -Continue Marinol -Please ensure patient is tapering his extended prednisone course -He should have completed 10 days of 20 mg of prednisone daily -The patient should be on 10 mg of prednisone daily for 10 more days  2.  Labs / imaging needed at time of follow-up: none  3.  Pending labs/ test needing follow-up: none  Follow-up Appointments: Follow-up Information    [place]. Call in 1 week(s).   Contact information: [address] [place] [facility]. Schedule an appointment as soon as possible for a visit.   Specialty:  Gastroenterology Contact information: [place] [id number] [facility] Course by problem list: Principal Problem:   Constipation Active Problems:   Atrial fibrillation with rapid  ventricular response (HCC)   Ileus (HCC)   Protein-calorie malnutrition, severe   Fecal impaction ([place])   Acute kidney injury ([place])   DNR (do not resuscitate)   Regional colitis ([place])   Nausea vomiting and diarrhea   1. Constipation/Ileus Mr. Vallone was admitted to [facility] and the Internal Medicine Teaching Service for abdominal pain, diarrhea and vomiting. He was afebrile in atrial fibrillation with RVR with stable blood pressures, saturating well on room air. Patient had just completed azithromycin for a COPD exacerbation, so enteric precautions were placed and C.diff screen was ordered. C. Diff screen was negative. CMET demonstrated AKI with Cr 1.57 (BL ~1.3), elevated BUN at 36, elevated AST and ALT at 107 ad 153, respectively. CBC with leukocytosis to 19,000 and Hb 17.9. Lactic acid 2.25 and lipase was normal. He was given 1 liter of NS and fentanyl for pain. CT abdomen and pelvis revealed significant formed stool burden of the rectum, suggesting at least constipation,  potentially obstipation. He was treated with magnesium citrate and milk of molasses enema. He had two large bowel movements over night with resolution of symptoms and was started on a regular diet and bowel regimen. Unfortunately, several days later the patient developed lower abdominal pain, distention, and large volume emesis. He was made NPO due to concern for bowel obstruction and another CT abdomen/pelvis was performed. He had no evidence of bowel obstruction. Imaging showed mildly dilated, gas-filled loops of nondependent colon, raising the possibility of a dynamic colonic ileus; rectal stool burden significantly improved from the prior. He was continued on a bowel regimen of Miralax and senokot but was unable to have a bowel movement. His bowel regimen was escalated to Lactulose and milk of molasses enema, which relieved his constipation. He was discharged on Miralax twice daily and two tablets of senokot twice daily.  I reiterated the importance of adhering to this bowel regimen and increasing the Miralax to three times daily if needed. It was explained to Mr. Riddle multiple times that chronic opioid use, as well as immobility could cause constipation and ileus. He was not satisfied with this explanation, therefore I suggested he follow up with [facility], whom he had an upper endoscopy with several years prior. He was provided with their contact information.   2. Chronic Atrial Fibrillation on anticoagulation, Atrial fibrillation complicated by RVR Mr. Radell presented to [facility] in atrial fibrillation with RVR to 150's. He was afebrile with stable blood pressures and was saturating well on room air. He was started on cardizem infusion with improvement in his rates. He was transitioned to his PO regimen of Cardizem and Metoprolol. The patient did have recurrent sustained RVR so he was placed on a diltiazem drip again on day three of hospitalization. His rates improved with resolution of his constipation and abdominal pain, and was transitioned to PO Cardizem and Metoprolol. He did not require diltiazem infusion for the duration of his hospitalization and was discharged with no changes in his rate control medication, as well as Eliquis 2.5 mg daily.   3. End Stage COPD Mr. Miller remained at his respiratory status baseline the duration of his hospitalization. He did not require more than his home oxygen requirement. He was continued on home inhaler regimen.   4. Elevated AST, ALT Mr. Kroft presented with elevated liver function tests: AST 107, ALT 153.  Acute hepatitis panel resulted negative.  Liver and gallbladder were unremarkable on imaging, only trace intrahepatic biliary ductal dilatation without evidence of radiopaque stones. By discharge the patient's LFTs had normalized.   5. Chronic Combined Systolic and Diastolic Congestive Heart Failure Remained euvolemic despite receiving IV fluids. He was likely volume  down secondary to dehydration. His volume status was monitored closely and was stable the duration of hospitalization.   6. Chronic Kidney Disease, Stage III He initially presented with an elevated creatinine of 1.53. This improved with IV fluids and returned to his baseline serum creatinine of ~1.3.  7. Severe Malnutrition BMI 17%. He has a poor appetite chronically and is on Marinol at home. He is currently on an extended course of steroids for COPD exacerbation at patient request to increase his appetite. Steroids were continued at discharge.   8. History of Depression, Bipolar 1 Disorder Mood was stable throughout hospitalization. Continued Wellbutrin 100 mg BID.    Discharge Vitals:   BP (!) 126/104 (BP Location: Left Arm)   Pulse 94   Temp 98.6 F (37 C) (Oral)   Resp [name])  21   Ht 6\' 3"  (1.905 m)   Wt 142 lb 3.2 oz (64.5 kg)   SpO2 94%   BMI 17.77 kg/m   Pertinent Labs, Studies, and Procedures:  CMP Latest Ref Rng & Units 07/27/2017 07/26/2017 07/25/2017  Glucose 65 - 99 mg/dL 98 145(H) 117(H)  BUN 6 - 20 mg/dL 31(H) 30(H) 28(H)  Creatinine 0.61 - 1.24 mg/dL 1.30(H) 1.32(H) 1.18  Sodium 135 - 145 mmol/L 141 143 138  Potassium 3.5 - 5.1 mmol/L 5.1 4.8 5.2(H)  Chloride 101 - 111 mmol/L 111 109 106  CO2 22 - 32 mmol/L 21(L) 24 23  Calcium 8.9 - 10.3 mg/dL 8.6(L) 8.7(L) 8.4(L)  Total Protein 6.5 - 8.1 g/dL - - 5.0(L)  Total Bilirubin 0.3 - 1.2 mg/dL - - 1.0  Alkaline Phos 38 - 126 U/L - - 39  AST 15 - 41 U/L - - 33  ALT 17 - 63 U/L - - 49   CBC Latest Ref Rng & Units 07/25/2017 07/23/2017 07/22/2017  WBC 4.0 - 10.5 K/uL 10.9(H) 13.3(H) 16.8(H)  Hemoglobin 13.0 - 17.0 g/dL 14.1 13.7 13.9  Hematocrit 39.0 - 52.0 % 43.0 42.2 41.6  Platelets 150 - 400 K/uL 144(L) 145(L) 137(L)   CT Abdomen Pelvis (07/21/2017) IMPRESSION: No acute vascular abnormality.  Mild atherosclerotic changes of the aorta, with no significant mesenteric arterial disease that was suggest acute or  chronic mesenteric ischemia. Aortic Atherosclerosis (ICD10-I70.0).  Significant formed stool burden of the rectum, suggesting at least constipation, potentially obstipation. There are mild inflammatory changes in the fat adjacent to the rectum, which can be seen with stercoral colitis. Correlation with symptoms/patient presentation may be useful.  Redemonstration of left-sided pleural plaques with associated rounded atelectasis.  Portal vein remains patent, with what appears to be developing cavernous transformation at the liver hilum.  CT Abdomen Pelvis (07/24/2017) IMPRESSION: Mildly dilated loops of nondependent colon, raising the possibility of adynamic colonic ileus. No evidence of bowel obstruction.  Mild rectal stool burden, significantly improved from the prior.  Discharge Instructions: Discharge Instructions    (HEART FAILURE PATIENTS) Call MD:  Anytime you have any of the following symptoms: 1) 3 pound weight gain in 24 hours or 5 pounds in 1 week 2) shortness of breath, with or without a dry hacking cough 3) swelling in the hands, feet or stomach 4) if you have to sleep on extra pillows at night in order to breathe.   Complete by:  As directed    Call MD for:  difficulty breathing, headache or visual disturbances   Complete by:  As directed    Call MD for:  persistant nausea and vomiting   Complete by:  As directed    Call MD for:  severe uncontrolled pain   Complete by:  As directed    Diet - low sodium heart healthy   Complete by:  As directed    Discharge instructions   Complete by:  As directed    Mr. Tschantz,   You admitted for severe constipation. Your symptoms improved with enemas, laxatives, and stool softeners.   I have made adjustments to your home bowel regimen: -Please take Miralax twice daily instead of once daily  -Please take 2 tablets of Senokot twice daily rather than 1 tablet of Senokot twice daily  I have made no changes to any other  medications.   Please call the Internal Medicine to make an hospital follow up appointment in clinic in one week following hospitalization.  I have  also provided you with the phone number for [facility]. You have previously seen them for a colonoscopy in the past so you should not need a referral. I believe you would benefit from scheduling an appointment with a gastroenterologist and be evaluated for you recurrent constipation.   Increase activity slowly   Complete by:  As directed       Signed: Melanee Spry, MD 07/28/2017, 2:26 PM   Pager: [phone]

## 2017-07-27 NOTE — Progress Notes (Signed)
Internal Medicine Attending:   I saw and examined the patient. I reviewed Dr Lacroce's note and I agree with the resident's findings and plan as documented in the resident's note. Stable for discharge home today.

## 2017-07-30 ENCOUNTER — Telehealth: Payer: Self-pay | Admitting: Internal Medicine

## 2017-07-30 ENCOUNTER — Other Ambulatory Visit: Payer: Self-pay | Admitting: Pharmacist

## 2017-07-30 ENCOUNTER — Encounter: Payer: Medicare Other | Admitting: Internal Medicine

## 2017-07-30 DIAGNOSIS — J441 Chronic obstructive pulmonary disease with (acute) exacerbation: Secondary | ICD-10-CM

## 2017-07-30 NOTE — Progress Notes (Signed)
Discussed case with PCP, may consider possibility of adding ICS and or LABA in the future. For now, approved for pulmonary rehab referral---order submitted.

## 2017-07-30 NOTE — Telephone Encounter (Signed)
Order patient physical week for 2 weeks and 2 twice 1   Order skill assesment  Medical social  Home health aid

## 2017-07-30 NOTE — Telephone Encounter (Signed)
Agree, thank you

## 2017-08-05 ENCOUNTER — Telehealth: Payer: Self-pay | Admitting: Internal Medicine

## 2017-08-05 ENCOUNTER — Other Ambulatory Visit: Payer: Self-pay | Admitting: Internal Medicine

## 2017-08-05 DIAGNOSIS — I428 Other cardiomyopathies: Secondary | ICD-10-CM

## 2017-08-05 NOTE — Progress Notes (Signed)
.  pul

## 2017-08-05 NOTE — Telephone Encounter (Signed)
Verbal authorization given as below to [place] at [facility]. Hubbard Hartshorn, RN, BSN

## 2017-08-05 NOTE — Telephone Encounter (Signed)
[facility] [phone] needs verbal orders  Physical therapy for 1 once for 1 week 2 a week for 2 week and 1 once a week for 2 weeks  Skilled nursing consultation  Home Aid for bathing 2 a week for 2 weeks  Medical Social worker consultation

## 2017-08-05 NOTE — Telephone Encounter (Signed)
Agree. Thanks

## 2017-08-11 ENCOUNTER — Telehealth: Payer: Self-pay

## 2017-08-13 ENCOUNTER — Ambulatory Visit (INDEPENDENT_AMBULATORY_CARE_PROVIDER_SITE_OTHER): Payer: Medicare Other | Admitting: Internal Medicine

## 2017-08-13 VITALS — BP 112/94 | HR 101 | Temp 97.8°F | Ht 75.0 in | Wt 135.0 lb

## 2017-08-13 DIAGNOSIS — I481 Persistent atrial fibrillation: Secondary | ICD-10-CM

## 2017-08-13 DIAGNOSIS — K5909 Other constipation: Secondary | ICD-10-CM | POA: Diagnosis not present

## 2017-08-13 DIAGNOSIS — J441 Chronic obstructive pulmonary disease with (acute) exacerbation: Secondary | ICD-10-CM

## 2017-08-13 DIAGNOSIS — F339 Major depressive disorder, recurrent, unspecified: Secondary | ICD-10-CM | POA: Diagnosis not present

## 2017-08-13 DIAGNOSIS — E43 Unspecified severe protein-calorie malnutrition: Secondary | ICD-10-CM

## 2017-08-13 DIAGNOSIS — Z7901 Long term (current) use of anticoagulants: Secondary | ICD-10-CM

## 2017-08-13 DIAGNOSIS — I5042 Chronic combined systolic (congestive) and diastolic (congestive) heart failure: Secondary | ICD-10-CM

## 2017-08-13 DIAGNOSIS — Z79899 Other long term (current) drug therapy: Secondary | ICD-10-CM | POA: Diagnosis not present

## 2017-08-13 DIAGNOSIS — Z79891 Long term (current) use of opiate analgesic: Secondary | ICD-10-CM | POA: Diagnosis not present

## 2017-08-13 DIAGNOSIS — Z9981 Dependence on supplemental oxygen: Secondary | ICD-10-CM

## 2017-08-13 DIAGNOSIS — I4811 Longstanding persistent atrial fibrillation: Secondary | ICD-10-CM

## 2017-08-13 DIAGNOSIS — K5900 Constipation, unspecified: Secondary | ICD-10-CM

## 2017-08-13 DIAGNOSIS — Z681 Body mass index (BMI) 19 or less, adult: Secondary | ICD-10-CM

## 2017-08-13 MED ORDER — ALBUTEROL SULFATE HFA 108 (90 BASE) MCG/ACT IN AERS: 1.0000 | INHALATION_SPRAY | Freq: Four times a day (QID) | RESPIRATORY_TRACT | 2 refills | Status: AC | PRN

## 2017-08-13 NOTE — Patient Instructions (Addendum)
[name] it was nice seeing you today.  -Take over-the-counter magnesium citrate or use over-the-counter fleets enema to help with your constipation.  Once you have a bowel movement, take MiraLAX twice daily and Senokot 2 tablets twice daily on a regular basis.  -Continue taking diltiazem, metoprolol, and Eliquis for your atrial fibrillation.  -Return for a follow-up in 1 month.

## 2017-08-14 ENCOUNTER — Other Ambulatory Visit: Payer: Self-pay | Admitting: Pharmacist

## 2017-08-14 DIAGNOSIS — K5903 Drug induced constipation: Secondary | ICD-10-CM

## 2017-08-14 MED ORDER — NALOXEGOL OXALATE 12.5 MG PO TABS: 12.5000 mg | ORAL_TABLET | Freq: Every day | ORAL | 0 refills | Status: DC

## 2017-08-14 MED ORDER — BUPROPION HCL ER (SR) 150 MG PO TB12: 150.0000 mg | ORAL_TABLET | Freq: Two times a day (BID) | ORAL | 0 refills | Status: DC

## 2017-08-14 NOTE — Progress Notes (Signed)
CC: Hospital follow-up of ileus  HPI:  [name] is a 72 y.o. male with a past medical history of conditions listed below presenting to the clinic for a hospital follow-up of ileus. Please see problem based charting for the status of the patient's current and chronic medical conditions.   Past Medical History:  Diagnosis Date  . Alcohol abuse   . Anxiety   . Asbestos exposure   . Atrial fibrillation with RVR ([place]) 01/17/2014  . Avascular necrosis of bones of both hips (HCC)    Status post bilateral hip replacements  . Bipolar disorder ([place])   . Chronic systolic heart failure ([place])    a. NICM - EF 35% with normal cors 2003. b. Last echo 11/2012 - EF 25-30%.  . CKD (chronic kidney disease), stage III ([place])    Renal U/S 12/04/2009 showed no pathological findings. Labs 12/04/2009 include normal ESR, C3, C4; neg ANA; SPEP showed nonspecific increase in the alpha-2 region with no M-spike; UPEP showed no monoclonal free light chains; urine IFE showed polyclonal increase in feree Kappa and/or free Lambda light chains. Baseline Cr reported 1.7-2.5.  [name] COPD (chronic obstructive pulmonary disease) ([place])   . Depression   . DVT (deep venous thrombosis) ([place])    He has hypercoagulability with multiple prior DVTs  . E coli bacteremia   . Erectile dysfunction   . Failure to thrive (0-17) 08/2016   DEHYDRATION  . Gastritis   . GERD (gastroesophageal reflux disease)   . HCAP (healthcare-associated pneumonia) 08/24/2015  . Hydrocele, unspecified   . Hyperlipemia   . Hypertension   . 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  Thyroid scan 08/28/2009 was normal with no focal areas of abnormal increased or decreased activity seen; the uptake of I 131 sodium iodide at 24 hours was 5.7%.  TSH and free T4 normalized by 08/16/2009.  . Intestinal obstruction ([place])   . Lung nodule    Chest CT scan on 12/14/2008 showed a nodular opacity at the left lung base felt  to most likely represent scarring.  Follow-up chest CT scan on 06/02/2010 showed parenchymal scarring in the left apex, left  lower lobe, and lingula; some of this scarring at the left base had a nodular appearance, unchanged. Chest 09/2012 - stable.  [name] NICM (nonischemic cardiomyopathy) ([place])   . Noncompliance   . On home oxygen therapy    "3L periodically" (08/24/2015)  . Osteoarthritis   . Permanent atrial fibrillation ([place])    a. failed ablation in 2010 due to inability to access/venous occlusions. b. failed multaq, amiodarone, tikosyn. c. has not been felt to be a good candidate for AVN ablation due to recurrent infections/bacteremia history.  . Pleural plaque    H/o asbestos exposure. Chest CT on 06/02/2010 showed stable extensive calcified pleural plaques involving the left hemithorax, consistent with asbestos related pleural disease.  . Portal vein thrombosis   . Protein calorie malnutrition ([place]) 04/2016  . Spondylosis   . STEMI (ST elevation myocardial infarction) ([place]) 00/92/3300   a. felt embolic in nature due to noncompliance with anticoagulation, LHC not pursued.  . 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 b. St. Jude gen change 2010.  [name] Thrombocytopenia ([place])   . 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  . Weight loss    Normal colonoscopy by Dr. Olevia Perches on  11/06/2010.   Review of Systems: Pertinent positives mentioned in HPI. Remainder of all ROS negative.   Physical Exam:  Vitals:   08/13/17 1627  BP: (!) 112/94  Pulse: (!) 101  Temp: 97.8 F (36.6 C)  TempSrc: Oral  SpO2: 100%  Weight: 135 lb (61.2 kg)  Height: '6\' 3"'  (1.905 m)   Physical Exam  Constitutional: He is oriented to person, place, and time. No distress.  HENT:  Mouth/Throat: Oropharynx is clear and moist.  Eyes: Right eye exhibits no discharge. Left eye exhibits  no discharge.  Cardiovascular: Intact distal pulses.  Tachycardic, irregularly irregular rhythm.  Pulmonary/Chest: Effort normal and breath sounds normal. No respiratory distress. He has no wheezes. He has no rales.  On supplemental home oxygen.  Abdominal: Soft. Bowel sounds are normal. He exhibits no distension. There is tenderness. There is no rebound.  Lower quadrants tender to palpation  Musculoskeletal: He exhibits no edema.  Neurological: He is alert and oriented to person, place, and time.  Skin: Skin is warm and dry.    Assessment & Plan:   See Encounters Tab for problem based charting.  Patient discussed with Dr. Beryle Beams

## 2017-08-14 NOTE — Assessment & Plan Note (Signed)
This problem is chronic and stable.  Patient has chronic persistent atrial fibrillation for which she takes diltiazem 120 mg daily, metoprolol 50 mg twice daily, and Eliquis 2.5 mg twice daily.  Pulse is chronically in the low 100s.  Plan -Continue current management

## 2017-08-14 NOTE — Progress Notes (Signed)
movantik price check

## 2017-08-14 NOTE — Assessment & Plan Note (Signed)
Patient states he chronically uses 3 L home oxygen.  He continues to complain of dyspnea and wheezing.  He is using Spiriva daily but has not used albuterol as his inhaler expired.  States his home oxygen requirement has not changed.  Plan -Refill albuterol -Continue Spiriva

## 2017-08-14 NOTE — Assessment & Plan Note (Signed)
He has finished his recent prednisone taper which was started during his recent hospitalization.  He continues to drink Ensure shakes and takes Marinol.  Plan -Continue current management

## 2017-08-14 NOTE — Assessment & Plan Note (Addendum)
He continues to experience dyspnea.  Denies having any orthopnea or lower extremity edema.  Appears euvolemic on exam.  States his home O2 requirement has not changed. Shortness of breath likely due to end-stage COPD.  Plan -Continue beta blocker

## 2017-08-14 NOTE — Assessment & Plan Note (Signed)
Patient continues to complain of depression, decreased sleep, and low energy despite taking bupropion 100 mg twice daily.  Denies having any suicidal ideation.  Plan -Increased dose of bupropion to 150 mg twice daily -Reevaluate symptoms at next visit

## 2017-08-14 NOTE — Assessment & Plan Note (Signed)
Patient was recently admitted for ileus and increased colonic stool burden.  He was discharged with instructions to take MiraLAX twice a day with titration to 3 times a day if needed and also Senokot twice a day.  He reports using Senokot twice daily but is using MiraLAX once every other day due to its high cost.  States he has only had 2 bowel movements since his hospital discharge 17 days ago.  He continues to pass flatus.  He is complaining of occasional pain in his lower abdominal quadrants. He believes his appetite is chronically low.  Denies having any nausea or vomiting.  Explained to the patient that his chronic constipation is likely related to chronic opioid use.  Plan -CT abdomen and pelvis with contrast to rule out an obstructing mass -Advised him to use over-the-counter magnesium citrate or fleets enema until he has a bowel movement.  Then, continue a daily regimen of MiraLAX and Senokot.

## 2017-08-15 ENCOUNTER — Ambulatory Visit (HOSPITAL_COMMUNITY): Payer: Medicare Other | Attending: Oncology

## 2017-08-15 ENCOUNTER — Telehealth: Payer: Self-pay | Admitting: Internal Medicine

## 2017-08-15 NOTE — Telephone Encounter (Signed)
Pt called back this morning and states he would like for you to call him back as he is not coming to this appt sch for this afternoon @ 3p.m for his STAT CT.

## 2017-08-15 NOTE — Progress Notes (Signed)
Medicine attending: Medical history, presenting problems, physical findings, and medications, reviewed with resident physician Dr Berline Lopes on the day of the patient visit and I concur with her evaluation and management plan. Chronic constipation. On chronic narcotic analgesics. Recent hospital admit with ileus due to increased stool burden. May not be using laxatives as prescribed. Will try Magnesium citrate. Schedule a gatrtograffin swallow to R/O small bowel pathology. Enquire into more expensive laxative approved for narcotic related constipation to see if his insurance covers it.

## 2017-08-18 ENCOUNTER — Telehealth: Payer: Self-pay | Admitting: *Deleted

## 2017-08-18 NOTE — Telephone Encounter (Signed)
[place] [facility] calls and states she just saw pt in his home, she states he cannot walk, is laying in bed with appr 6 urine bottles around him and in dirty clothes, he is c/o constipation but is not compliant with instructions given at office visit last week for constipation. He is not able to care for himself. She would like a CSW and possible aps to become involved. VO given, do you agree? Pt would like to be admitted to hospital

## 2017-08-18 NOTE — Telephone Encounter (Signed)
Rec'd call from [facility] Nurse pt was sch for a STAT CT on Friday 08/15/2017 and NS appt.  Pt is feeling very weak and dizzy today  Called Sch @  [facility] at pt's request to sch for Wednesday @ [facility] @ 3:15pm.

## 2017-08-18 NOTE — Telephone Encounter (Signed)
Agree with verbal order. Patient should be seen in clinic for further evaluation or if needed present to the ED.

## 2017-08-19 ENCOUNTER — Telehealth: Payer: Self-pay | Admitting: *Deleted

## 2017-08-19 ENCOUNTER — Other Ambulatory Visit: Payer: Self-pay | Admitting: Internal Medicine

## 2017-08-19 MED ORDER — DILTIAZEM HCL ER COATED BEADS 120 MG PO CP24: 120.0000 mg | ORAL_CAPSULE | Freq: Every day | ORAL | 2 refills | Status: DC

## 2017-08-19 NOTE — Telephone Encounter (Signed)
Patient called in stating he has [facility] Nurse with [facility]. Requesting order for [place] as well to assist with bathing. Will route to PCP and Referral Coordinator. Hubbard Hartshorn, RN, BSN

## 2017-08-20 ENCOUNTER — Ambulatory Visit (HOSPITAL_COMMUNITY): Payer: Medicare Other

## 2017-08-20 NOTE — Telephone Encounter (Signed)
Agree with HH Aide. Thank you.

## 2017-08-20 NOTE — Telephone Encounter (Signed)
Received VO from Dr. [name] for [facility] Aide for assistance with bathing/dressing. Spoke with Heather at [facility] and VO given for twice weekly for 4 weeks while patient is still working with [facility] OT. Hubbard Hartshorn, RN, BSN

## 2017-08-21 MED ORDER — LINACLOTIDE 145 MCG PO CAPS: 145.0000 ug | ORAL_CAPSULE | Freq: Every day | ORAL | 3 refills | Status: DC

## 2017-08-21 NOTE — Telephone Encounter (Signed)
Since patient is weak and dizzy, please schedule him to be seen in [facility] as soon as possible. Thanks.

## 2017-08-21 NOTE — Telephone Encounter (Signed)
Pt stated he will try to come tomorrow (Friday) at 1045 AM in [facility]; he has an appt at [facility] for CT scan.

## 2017-08-21 NOTE — Progress Notes (Signed)
Spoke to Mr. Cotto over the phone. He reports feeling dizzy; no episodes of syncope. States his CT scan is scheduled for tomorrow. Advised him to come into the clinic today to be seen. Prescription for constipation medication Linzess has been sent to his pharmacy, pt aware. Please make sure he is seen in the clinic today for his dizziness. Thanks.

## 2017-08-22 ENCOUNTER — Other Ambulatory Visit: Payer: Self-pay

## 2017-08-22 ENCOUNTER — Encounter (HOSPITAL_COMMUNITY): Payer: Self-pay

## 2017-08-22 ENCOUNTER — Ambulatory Visit: Payer: Medicare Other

## 2017-08-22 ENCOUNTER — Ambulatory Visit (HOSPITAL_COMMUNITY): Admission: RE | Admit: 2017-08-22 | Payer: Medicare Other | Source: Ambulatory Visit

## 2017-08-22 ENCOUNTER — Telehealth: Payer: Self-pay | Admitting: *Deleted

## 2017-08-22 ENCOUNTER — Observation Stay (HOSPITAL_COMMUNITY)
Admission: EM | Admit: 2017-08-22 | Discharge: 2017-08-26 | Disposition: A | Discharge status: Home / Self Care | Payer: Medicare Other | Attending: Internal Medicine | Admitting: Internal Medicine

## 2017-08-22 ENCOUNTER — Emergency Department (HOSPITAL_COMMUNITY): Payer: Medicare Other

## 2017-08-22 DIAGNOSIS — R531 Weakness: Secondary | ICD-10-CM | POA: Diagnosis present

## 2017-08-22 DIAGNOSIS — I5042 Chronic combined systolic (congestive) and diastolic (congestive) heart failure: Secondary | ICD-10-CM | POA: Diagnosis not present

## 2017-08-22 DIAGNOSIS — J449 Chronic obstructive pulmonary disease, unspecified: Secondary | ICD-10-CM | POA: Insufficient documentation

## 2017-08-22 DIAGNOSIS — Z79899 Other long term (current) drug therapy: Secondary | ICD-10-CM | POA: Diagnosis not present

## 2017-08-22 DIAGNOSIS — I130 Hypertensive heart and chronic kidney disease with heart failure and stage 1 through stage 4 chronic kidney disease, or unspecified chronic kidney disease: Secondary | ICD-10-CM | POA: Insufficient documentation

## 2017-08-22 DIAGNOSIS — Y999 Unspecified external cause status: Secondary | ICD-10-CM | POA: Diagnosis not present

## 2017-08-22 DIAGNOSIS — W19XXXA Unspecified fall, initial encounter: Secondary | ICD-10-CM | POA: Insufficient documentation

## 2017-08-22 DIAGNOSIS — Y9389 Activity, other specified: Secondary | ICD-10-CM | POA: Diagnosis not present

## 2017-08-22 DIAGNOSIS — Y92003 Bedroom of unspecified non-institutional (private) residence as the place of occurrence of the external cause: Secondary | ICD-10-CM | POA: Insufficient documentation

## 2017-08-22 DIAGNOSIS — Z91040 Latex allergy status: Secondary | ICD-10-CM | POA: Insufficient documentation

## 2017-08-22 DIAGNOSIS — Z96643 Presence of artificial hip joint, bilateral: Secondary | ICD-10-CM | POA: Insufficient documentation

## 2017-08-22 DIAGNOSIS — N179 Acute kidney failure, unspecified: Secondary | ICD-10-CM

## 2017-08-22 DIAGNOSIS — M542 Cervicalgia: Secondary | ICD-10-CM | POA: Insufficient documentation

## 2017-08-22 DIAGNOSIS — R1030 Lower abdominal pain, unspecified: Secondary | ICD-10-CM | POA: Insufficient documentation

## 2017-08-22 DIAGNOSIS — Z7901 Long term (current) use of anticoagulants: Secondary | ICD-10-CM | POA: Insufficient documentation

## 2017-08-22 DIAGNOSIS — R1110 Vomiting, unspecified: Secondary | ICD-10-CM

## 2017-08-22 DIAGNOSIS — R55 Syncope and collapse: Secondary | ICD-10-CM | POA: Insufficient documentation

## 2017-08-22 DIAGNOSIS — F17210 Nicotine dependence, cigarettes, uncomplicated: Secondary | ICD-10-CM | POA: Diagnosis not present

## 2017-08-22 DIAGNOSIS — K5900 Constipation, unspecified: Secondary | ICD-10-CM | POA: Diagnosis not present

## 2017-08-22 DIAGNOSIS — N183 Chronic kidney disease, stage 3 (moderate): Secondary | ICD-10-CM | POA: Insufficient documentation

## 2017-08-22 DIAGNOSIS — R51 Headache: Secondary | ICD-10-CM | POA: Insufficient documentation

## 2017-08-22 DIAGNOSIS — I4891 Unspecified atrial fibrillation: Secondary | ICD-10-CM | POA: Diagnosis not present

## 2017-08-22 DIAGNOSIS — Z950 Presence of cardiac pacemaker: Secondary | ICD-10-CM | POA: Diagnosis not present

## 2017-08-22 LAB — COMPREHENSIVE METABOLIC PANEL
ALBUMIN: 3.3 g/dL — AB (ref 3.5–5.0)
ALT: 16 U/L — AB (ref 17–63)
AST: 21 U/L (ref 15–41)
Alkaline Phosphatase: 53 U/L (ref 38–126)
Anion gap: 11 (ref 5–15)
BILIRUBIN TOTAL: 1 mg/dL (ref 0.3–1.2)
BUN: 36 mg/dL — AB (ref 6–20)
CHLORIDE: 111 mmol/L (ref 101–111)
CO2: 20 mmol/L — ABNORMAL LOW (ref 22–32)
CREATININE: 1.83 mg/dL — AB (ref 0.61–1.24)
Calcium: 8.8 mg/dL — ABNORMAL LOW (ref 8.9–10.3)
GFR calc Af Amer: 41 mL/min — ABNORMAL LOW (ref >60)
GFR calc non Af Amer: 35 mL/min — ABNORMAL LOW (ref >60)
GLUCOSE: 83 mg/dL (ref 65–99)
POTASSIUM: 4.3 mmol/L (ref 3.5–5.1)
Sodium: 142 mmol/L (ref 135–145)
TOTAL PROTEIN: 5.8 g/dL — AB (ref 6.5–8.1)

## 2017-08-22 LAB — CBC WITH DIFFERENTIAL/PLATELET
BASOS PCT: 0 %
Basophils Absolute: 0 10*3/uL (ref 0.0–0.1)
EOS ABS: 0.1 10*3/uL (ref 0.0–0.7)
Eosinophils Relative: 1 %
HCT: 44.7 % (ref 39.0–52.0)
Hemoglobin: 14.4 g/dL (ref 13.0–17.0)
Lymphocytes Relative: 26 %
Lymphs Abs: 2 10*3/uL (ref 0.7–4.0)
MCH: 31 pg (ref 26.0–34.0)
MCHC: 32.2 g/dL (ref 30.0–36.0)
MCV: 96.1 fL (ref 78.0–100.0)
MONO ABS: 0.4 10*3/uL (ref 0.1–1.0)
MONOS PCT: 5 %
Neutro Abs: 5.2 10*3/uL (ref 1.7–7.7)
Neutrophils Relative %: 68 %
Platelets: 130 10*3/uL — ABNORMAL LOW (ref 150–400)
RBC: 4.65 MIL/uL (ref 4.22–5.81)
RDW: 15.4 % (ref 11.5–15.5)
WBC: 7.6 10*3/uL (ref 4.0–10.5)

## 2017-08-22 LAB — URINALYSIS, ROUTINE W REFLEX MICROSCOPIC
BILIRUBIN URINE: NEGATIVE
Glucose, UA: NEGATIVE mg/dL
HGB URINE DIPSTICK: NEGATIVE
Ketones, ur: NEGATIVE mg/dL
NITRITE: NEGATIVE
Protein, ur: NEGATIVE mg/dL
SPECIFIC GRAVITY, URINE: 1.02 (ref 1.005–1.030)
pH: 5 (ref 5.0–8.0)

## 2017-08-22 LAB — MRSA PCR SCREENING: MRSA BY PCR: NEGATIVE

## 2017-08-22 LAB — TROPONIN I: Troponin I: <0.03 ng/mL (ref <0.03)

## 2017-08-22 MED ORDER — BUPROPION HCL ER (SR) 150 MG PO TB12
150.0000 mg | ORAL_TABLET | Freq: Two times a day (BID) | ORAL | Status: DC
  Administered 2017-08-22 – 2017-08-26 (×8): 150 mg via ORAL
  Filled 2017-08-22 (×8): qty 1

## 2017-08-22 MED ORDER — SODIUM CHLORIDE 0.9 % IV SOLN
INTRAVENOUS | Status: AC
  Administered 2017-08-22: 19:00:00 via INTRAVENOUS

## 2017-08-22 MED ORDER — OXYCODONE HCL 5 MG PO TABS
5.0000 mg | ORAL_TABLET | ORAL | Status: DC | PRN
  Administered 2017-08-23 – 2017-08-26 (×9): 5 mg via ORAL
  Filled 2017-08-22 (×9): qty 1

## 2017-08-22 MED ORDER — OXYCODONE-ACETAMINOPHEN 10-325 MG PO TABS: 1.0000 | ORAL_TABLET | Freq: Once | ORAL | Status: DC

## 2017-08-22 MED ORDER — SENNOSIDES-DOCUSATE SODIUM 8.6-50 MG PO TABS
2.0000 | ORAL_TABLET | Freq: Two times a day (BID) | ORAL | Status: DC
  Administered 2017-08-22 – 2017-08-26 (×4): 2 via ORAL
  Filled 2017-08-22 (×6): qty 2

## 2017-08-22 MED ORDER — ROSUVASTATIN CALCIUM 20 MG PO TABS
20.0000 mg | ORAL_TABLET | Freq: Every day | ORAL | Status: DC
  Administered 2017-08-22 – 2017-08-25 (×4): 20 mg via ORAL
  Filled 2017-08-22 (×5): qty 1

## 2017-08-22 MED ORDER — ASPIRIN 81 MG PO CHEW
81.0000 mg | CHEWABLE_TABLET | Freq: Every day | ORAL | Status: DC
  Administered 2017-08-23 – 2017-08-26 (×4): 81 mg via ORAL
  Filled 2017-08-22 (×4): qty 1

## 2017-08-22 MED ORDER — ALBUTEROL SULFATE (2.5 MG/3ML) 0.083% IN NEBU: 3.0000 mL | INHALATION_SOLUTION | Freq: Four times a day (QID) | RESPIRATORY_TRACT | Status: DC | PRN

## 2017-08-22 MED ORDER — LINACLOTIDE 145 MCG PO CAPS
145.0000 ug | ORAL_CAPSULE | Freq: Every day | ORAL | Status: DC
  Administered 2017-08-23 – 2017-08-26 (×4): 145 ug via ORAL
  Filled 2017-08-22 (×5): qty 1

## 2017-08-22 MED ORDER — SODIUM CHLORIDE 0.9 % IV BOLUS
500.0000 mL | Freq: Once | INTRAVENOUS | Status: AC
  Administered 2017-08-22: 500 mL via INTRAVENOUS

## 2017-08-22 MED ORDER — ACETAMINOPHEN 650 MG RE SUPP: 650.0000 mg | Freq: Four times a day (QID) | RECTAL | Status: DC | PRN

## 2017-08-22 MED ORDER — OXYCODONE-ACETAMINOPHEN 5-325 MG PO TABS
1.0000 | ORAL_TABLET | Freq: Once | ORAL | Status: AC
  Administered 2017-08-22: 1 via ORAL
  Filled 2017-08-22: qty 1

## 2017-08-22 MED ORDER — OXYCODONE HCL 5 MG PO TABS
5.0000 mg | ORAL_TABLET | Freq: Once | ORAL | Status: AC
  Administered 2017-08-22: 5 mg via ORAL
  Filled 2017-08-22: qty 1

## 2017-08-22 MED ORDER — DILTIAZEM HCL-DEXTROSE 100-5 MG/100ML-% IV SOLN (PREMIX)
5.0000 mg/h | INTRAVENOUS | Status: DC
  Administered 2017-08-22 – 2017-08-23 (×2): 5 mg/h via INTRAVENOUS
  Filled 2017-08-22 (×2): qty 100

## 2017-08-22 MED ORDER — TIOTROPIUM BROMIDE MONOHYDRATE 18 MCG IN CAPS
18.0000 ug | ORAL_CAPSULE | Freq: Every day | RESPIRATORY_TRACT | Status: DC
  Administered 2017-08-25 – 2017-08-26 (×2): 18 ug via RESPIRATORY_TRACT
  Filled 2017-08-22: qty 5

## 2017-08-22 MED ORDER — DILTIAZEM LOAD VIA INFUSION
10.0000 mg | Freq: Once | INTRAVENOUS | Status: AC
  Administered 2017-08-22: 10 mg via INTRAVENOUS
  Filled 2017-08-22: qty 10

## 2017-08-22 MED ORDER — OXYCODONE-ACETAMINOPHEN 5-325 MG PO TABS
1.0000 | ORAL_TABLET | ORAL | Status: DC | PRN
  Administered 2017-08-23 – 2017-08-26 (×9): 1 via ORAL
  Filled 2017-08-22 (×9): qty 1

## 2017-08-22 MED ORDER — METOPROLOL SUCCINATE ER 50 MG PO TB24
50.0000 mg | ORAL_TABLET | Freq: Two times a day (BID) | ORAL | Status: DC
  Administered 2017-08-22 – 2017-08-25 (×6): 50 mg via ORAL
  Filled 2017-08-22 (×7): qty 1

## 2017-08-22 MED ORDER — BOOST / RESOURCE BREEZE PO LIQD CUSTOM
1.0000 | Freq: Three times a day (TID) | ORAL | Status: DC
  Administered 2017-08-22 – 2017-08-25 (×6): 1 via ORAL
  Filled 2017-08-22: qty 1

## 2017-08-22 MED ORDER — APIXABAN 2.5 MG PO TABS
2.5000 mg | ORAL_TABLET | Freq: Two times a day (BID) | ORAL | Status: DC
  Administered 2017-08-22 – 2017-08-26 (×8): 2.5 mg via ORAL
  Filled 2017-08-22 (×9): qty 1

## 2017-08-22 MED ORDER — POLYETHYLENE GLYCOL 3350 17 G PO PACK
17.0000 g | PACK | Freq: Two times a day (BID) | ORAL | Status: DC
  Administered 2017-08-22 – 2017-08-25 (×3): 17 g via ORAL
  Filled 2017-08-22 (×6): qty 1

## 2017-08-22 MED ORDER — ACETAMINOPHEN 325 MG PO TABS: 650.0000 mg | ORAL_TABLET | Freq: Four times a day (QID) | ORAL | Status: DC | PRN

## 2017-08-22 NOTE — ED Notes (Signed)
Patient transported to X-ray 

## 2017-08-22 NOTE — Telephone Encounter (Signed)
Pt states he is weak, dizzy and generally feels unwell. Has appts today, cannot get up and get ready, he is advised to call 911, he is agreeable

## 2017-08-22 NOTE — ED Notes (Addendum)
Pt states he wants a regular diet and if he gets a heart healthy diet he wont eat it

## 2017-08-22 NOTE — ED Notes (Signed)
Report attempted 

## 2017-08-22 NOTE — ED Triage Notes (Signed)
[facility]- pt coming from home woke up this morning and was trying to get out of bed. Fell hit head, pt takes eloquis. Possible LOC. Pt also reports chest pain and dizziness X3 days. No n/v. BP 97/65 with EMS, HR 133, SPO2 100% on 3L. Home oxygen use as well. Pt had 324 of aspirin with EMS.

## 2017-08-22 NOTE — H&P (Addendum)
Date: 08/22/2017               Patient Name:  [name] MRN: [id number]  DOB: 02/18/1946 Age / Sex: 72 y.o., male   PCP: Zada Finders, MD         Medical Service: Internal Medicine Teaching Service         Attending Physician: Dr. Aldine Contes, MD    First Contact: Dr. Johny Chess Pager: [phone]  Second Contact: Dr. Heber [place] Pager: [phone]       After Hours (After 5p/  First Contact Pager: [phone]  weekends / holidays): Second Contact Pager: [phone]   Chief Complaint: Near Syncope, Fall   History of Present Illness: Mr. Longton is a 72 yo M with a past medical history of COPD (on home 3 L home O2 prn), a fib, HFrEF, HTN, CKD III, tachy-brady s/p pacemaker, PVD, depression, chronic pain who presented to the ED with complaints of light-headedness and a fall.   He reports a roughly 2-week history of feeling "dizzy headed" with description was consistent with presyncope.  The symptoms occur with positional changes primarily going from seated or laying to standing and typically resolve with lying back down.  This morning he got up from his bed and felt lightheaded before falling to the ground, and he believes he hit his head and left side.  He also notes intermittent periods of sensation of heart racing which occurs 2-3 times per day.  He has been wearing his home oxygen regularly.  He states he has not taken his medicine since yesterday morning as he periodically forgets to take evening doses of medicines due to being distracted, this includes last night.  He has also not had MiraLAX over the last 2 days due to running out.  In the ED, T 98.3, HR 145, BP 96/72, 100% on 3 L nasal cannula.  Labs notable for creatinine 1.83, troponin negative, CT head and neck negative for acute fractures or intracranial pathology.  He received IVF and was started on a diltiazem drip and admitted for further management.  Meds:  Current Meds  Medication Sig  . apixaban (ELIQUIS) 2.5 MG TABS tablet Take  1 tablet (2.5 mg total) by mouth 2 (two) times daily.  [name] aspirin 81 MG chewable tablet Chew 1 tablet (81 mg total) by mouth daily.  [name] buPROPion (WELLBUTRIN SR) 150 MG 12 hr tablet Take 1 tablet (150 mg total) by mouth 2 (two) times daily.  [name] diltiazem (CARDIZEM CD) 120 MG 24 hr capsule Take 1 capsule (120 mg total) by mouth daily.  [name] dronabinol (MARINOL) 10 MG capsule Take 10 mg by mouth 2 (two) times daily before a meal.  . ENSURE (ENSURE) Take 237 mLs by mouth 2 (two) times daily.  . metoprolol succinate (TOPROL-XL) 50 MG 24 hr tablet Take 1 tablet (50 mg total) by mouth every 12 (twelve) hours. Take with or immediately following a meal.  . oxyCODONE-acetaminophen (PERCOCET) 10-325 MG tablet Take 1 tablet by mouth every 4 (four) hours as needed for pain. Do not take more than 5 tablets in 24 hours.  . polyethylene glycol (MIRALAX / GLYCOLAX) packet Take 17 g by mouth 2 (two) times daily. (Patient taking differently: Take 17 g by mouth 2 (two) times daily as needed for mild constipation. )  . rosuvastatin (CRESTOR) 20 MG tablet TAKE 1 TABLET EVERY DAY AT 6 PM (Patient taking differently: Take 20 mg by mouth every evening. )  . tiotropium (SPIRIVA)  18 MCG inhalation capsule Place 1 capsule (18 mcg total) into inhaler and inhale daily.     Allergies: Allergies as of 08/22/2017 - Review Complete 08/22/2017  Allergen Reaction Noted  . Amiodarone Other (See Comments) 07/30/2015  . Tape Itching 07/21/2017  . Latex Rash 08/22/2017  . Penicillins Rash and Other (See Comments)    Past Medical History:  Diagnosis Date  . Alcohol abuse   . Anxiety   . Asbestos exposure   . Atrial fibrillation with RVR (HCC) 01/17/2014  . Avascular necrosis of bones of both hips (HCC)    Status post bilateral hip replacements  . Bipolar disorder (HCC)   . Chronic systolic heart failure (HCC)    a. NICM - EF 35% with normal cors 2003. b. Last echo 11/2012 - EF 25-30%.  . CKD (chronic kidney disease), stage III  (HCC)    Renal U/S 12/04/2009 showed no pathological findings. Labs 12/04/2009 include normal ESR, C3, C4; neg ANA; SPEP showed nonspecific increase in the alpha-2 region with no M-spike; UPEP showed no monoclonal free light chains; urine IFE showed polyclonal increase in feree Kappa and/or free Lambda light chains. Baseline Cr reported 1.7-2.5.  . COPD (chronic obstructive pulmonary disease) (HCC)   . Depression   . DVT (deep venous thrombosis) (HCC)    He has hypercoagulability with multiple prior DVTs  . E coli bacteremia   . Erectile dysfunction   . Failure to thrive (0-17) 08/2016   DEHYDRATION  . Gastritis   . GERD (gastroesophageal reflux disease)   . HCAP (healthcare-associated pneumonia) 08/24/2015  . Hydrocele, unspecified   . Hyperlipemia   . Hypertension   . 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  Thyroid scan 08/28/2009 was normal with no focal areas of abnormal increased or decreased activity seen; the uptake of I 131 sodium iodide at 24 hours was 5.7%.  TSH and free T4 normalized by 08/16/2009.  . Intestinal obstruction (HCC)   . Lung nodule    Chest CT scan on 12/14/2008 showed a nodular opacity at the left lung base felt to most likely represent scarring.  Follow-up chest CT scan on 06/02/2010 showed parenchymal scarring in the left apex, left  lower lobe, and lingula; some of this scarring at the left base had a nodular appearance, unchanged. Chest 09/2012 - stable.  . NICM (nonischemic cardiomyopathy) (HCC)   . Noncompliance   . On home oxygen therapy    "3L periodically" (08/24/2015)  . Osteoarthritis   . Permanent atrial fibrillation (HCC)    a. failed ablation in 2010 due to inability to access/venous occlusions. b. failed multaq, amiodarone, tikosyn. c. has not been felt to be a good candidate for AVN ablation due to recurrent infections/bacteremia history.  . Pleural plaque    H/o asbestos exposure. Chest CT on 06/02/2010 showed  stable extensive calcified pleural plaques involving the left hemithorax, consistent with asbestos related pleural disease.  . Portal vein thrombosis   . Protein calorie malnutrition (HCC) 04/2016  . Spondylosis   . STEMI (ST elevation myocardial infarction) (HCC) 10/11/2014   a. felt embolic in nature due to noncompliance with anticoagulation, LHC not pursued.  . Tachycardia-bradycardia syndrome (HCC)    a. 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 b. St. Jude gen change 2010.  . Thrombocytopenia (HCC)   . 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  . Weight loss      Normal colonoscopy by Dr. Brodie on 11/06/2010.    Family History:  Family History  Problem Relation Age of Onset  . Hypertension Mother   . Asthma Mother   . Coronary artery disease Mother   . Cancer Mother   . Stroke Mother   . Cancer Brother        Unsure of type.  . Heart disease Brother   . Heart attack Brother   . Heart attack Sister   . Colon cancer Neg Hx   . Lung cancer Neg Hx   . Prostate cancer Neg Hx      Social History:  Social History   Tobacco Use  . Smoking status: Current Some Day Smoker    Packs/day: 0.25    Years: 54.00    Pack years: 13.50    Types: Cigarettes    Start date: 1961  . Smokeless tobacco: Current User  . Tobacco comment: cutting back  Substance Use Topics  . Alcohol use: No    Alcohol/week: 0.0 oz    Comment: History of ETOH Abuse."  . Drug use: Yes    Frequency: 1.0 times per week    Types: Marijuana    Comment: Every other day.      Review of Systems: A complete ROS was negative except as per HPI.   Physical Exam: Blood pressure 94/71, pulse 76, temperature 98.5 F (36.9 C), temperature source Rectal, resp. rate (!) 28, SpO2 100 %. General: Frail elderly gentleman resting in bed comfortably, no acute distress Head: Normocephalic, no lacerations or hematoma  Eyes:  PERRL, pupils small, EOMI ENT: Slightly dry mucus membranes, no exudate, red discoloration to tongue (pt just ate red fruit snack) CV: Irregular, regular rate at time of exam, no murmur appreciated  Resp: Coarse breath sounds, no wheezing, normal work of breathing, no distress  Abd: Soft, +BS, mild tenderness to lower abdomen  Extr: No LE edema, cachectic  Neuro: Alert and oriented x3, no gross neurologic deficits Skin: Warm, dry    EKG: personally reviewed my interpretation is tachycardic with atrial fibrillation, increased amplitude of V4, V5 with T wave inversions.   CXR: personally reviewed my interpretation is similar chronic changes with no new consolidation or effusion.   Assessment & Plan by Problem:  Atrial Fibrillation Patient found to be in RVR on presentation, most likely due to missed medication doses with no doses for over 24 hours at this point.  May also be slightly dehydrated given elevated creatinine.  A. fib with RVR may be an explanation or contributing factor for near syncopal symptoms and fall.  Rate improved with initiation of diltiazem drip.  Will resume home metoprolol and wean diltiazem drip and resume home oral diltiazem. --Tele, monitor vital signs --Cont home metoprolol 50 mg BID --Wean Dilt and transition to home PO diltiazem --Cont home Eliquis   Near Syncope, Fall Patient complaining of several week history of lightheadedness with positional changes.  There may be several factors contributing to this including poor p.o. intake, chronic hypotension, as well as above atrial fibrillation.  Will monitor for improvement with management of these issues.  Acute on Chronic Kidney Injury (CKD III) Cr on presentation slightly elevated at 1.83, baseline appears to be typically elevated to 1.2-1.4 with GFR near 60, previously documented CKD III. Likely d/t decreased PO intake which is a chronic issue. Received 1 L total IVF in ED. --Gentle fluids at 75 cc/hr, encourage  PO intake --BMP  H/o Constipation, Impaction Patient has chronic issues   with constipation and fecal impaction including a recent admission in March.  He was discharged on MiraLAX twice daily, as well as senna twice daily.  However, on outpatient follow-up there was a question as to adherence of regimen patient notes he has run out of MiraLAX for 2 days.  ED provider performed rectal exam soft stool in rectal vault, no impaction. --Miralax BID, senna-docusate BID  --Cont home Linaclotide   H/o Hypotension Patient has chronic hypotension which may be contributing to symptoms.  Midodrine has been considered in the past.  Currently stable while on diltiazem drip.  We will continue to monitor  H/o HFrEF Appears dehydrated, no complaints of volume overload. Pt tolerated fluids well last admission and will again provide gentle rehydration.  Last echo with EF 30% in March 2017.  H/o COPD No acute respiratory symptoms or complaints, chest x-ray, notable for chronic changes. --Cont home Spiriva --Supplemental O2 prn, goal 88-92% --Duoneb prn   Chronic Pain on Chronic Opiates On home oxycodone q4hr likely contributing to ongoing issues with constipation. Will continue home dose given fall.   Dispo: Admit patient to Observation with expected length of stay less than 2 midnights.  Signed: Tawny Asal, MD 08/22/2017, 6:30 PM  Pager: [phone]

## 2017-08-22 NOTE — ED Provider Notes (Signed)
[facility] EMERGENCY DEPARTMENT Provider Note   CSN: [id number] Arrival date & time: 08/22/17  1211     History   Chief Complaint Chief Complaint  Patient presents with  . Fall    HPI [name] is a 72 y.o. male.  HPI  72 year old male with a history of atrial fibrillation, COPD, DVT, and cardiomyopathy presents with a fall and syncope.  He states his been feeling weak and lightheaded for 3 days.  Has also had left-sided chest pain during this time.  Has had a cough for about a week that is nonproductive.  Some shortness of breath.  He denies fevers.  He is been having lower abdominal pain for over 1 month and was due for a CT scan today.  He has chronic constipation.  He states today when he stood up to get out of bed he was so lightheaded he passed out.  He thinks he hit the left side of his body and has left head pain and left-sided neck pain.  Denies any weakness or numbness.  Pain is severe.  He chronically takes 10/325 Percocet. His lower abdominal pain is stable compared to past month. No vomiting.  Past Medical History:  Diagnosis Date  . Alcohol abuse   . Anxiety   . Asbestos exposure   . Atrial fibrillation with RVR ([place]) 01/17/2014  . Avascular necrosis of bones of both hips (HCC)    Status post bilateral hip replacements  . Bipolar disorder ([place])   . Chronic systolic heart failure ([place])    a. NICM - EF 35% with normal cors 2003. b. Last echo 11/2012 - EF 25-30%.  . CKD (chronic kidney disease), stage III ([place])    Renal U/S 12/04/2009 showed no pathological findings. Labs 12/04/2009 include normal ESR, C3, C4; neg ANA; SPEP showed nonspecific increase in the alpha-2 region with no M-spike; UPEP showed no monoclonal free light chains; urine IFE showed polyclonal increase in feree Kappa and/or free Lambda light chains. Baseline Cr reported 1.7-2.5.  [name] COPD (chronic obstructive pulmonary disease) ([place])   . Depression   . DVT (deep venous thrombosis)  ([place])    He has hypercoagulability with multiple prior DVTs  . E coli bacteremia   . Erectile dysfunction   . Failure to thrive (0-17) 08/2016   DEHYDRATION  . Gastritis   . GERD (gastroesophageal reflux disease)   . HCAP (healthcare-associated pneumonia) 08/24/2015  . Hydrocele, unspecified   . Hyperlipemia   . Hypertension   . 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  Thyroid scan 08/28/2009 was normal with no focal areas of abnormal increased or decreased activity seen; the uptake of I 131 sodium iodide at 24 hours was 5.7%.  TSH and free T4 normalized by 08/16/2009.  . Intestinal obstruction ([place])   . Lung nodule    Chest CT scan on 12/14/2008 showed a nodular opacity at the left lung base felt to most likely represent scarring.  Follow-up chest CT scan on 06/02/2010 showed parenchymal scarring in the left apex, left  lower lobe, and lingula; some of this scarring at the left base had a nodular appearance, unchanged. Chest 09/2012 - stable.  [name] NICM (nonischemic cardiomyopathy) ([place])   . Noncompliance   . On home oxygen therapy    "3L periodically" (08/24/2015)  . Osteoarthritis   . Permanent atrial fibrillation ([place])    a. failed ablation in 2010 due to inability to access/venous occlusions. b. failed  multaq, amiodarone, tikosyn. c. has not been felt to be a good candidate for AVN ablation due to recurrent infections/bacteremia history.  . Pleural plaque    H/o asbestos exposure. Chest CT on 06/02/2010 showed stable extensive calcified pleural plaques involving the left hemithorax, consistent with asbestos related pleural disease.  . Portal vein thrombosis   . Protein calorie malnutrition ([place]) 04/2016  . Spondylosis   . STEMI (ST elevation myocardial infarction) ([place]) 08/01/9456   a. felt embolic in nature due to noncompliance with anticoagulation, LHC not pursued.  . 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 b. St.  Jude gen change 2010.  [name] Thrombocytopenia ([place])   . 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  . Weight loss    Normal colonoscopy by Dr. Olevia Perches on 11/06/2010.    Patient Active Problem List   Diagnosis Date Noted  . Nausea vomiting and diarrhea   . 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 Acute kidney injury ([place])   . Paroxysmal atrial fibrillation (HCC)   . Palliative care encounter   . DNR (do not resuscitate)   . Pressure injury of skin 07/15/2017  . Orthostatic dizziness 06/20/2017  . Fecal impaction ([place]) 10/30/2016  . History of MI (myocardial infarction)   . History of DVT (deep vein thrombosis)   . Arterial embolus and thrombosis of lower extremity ([place])   . Thrombocytopenia ([place]) 09/10/2016  . Abdominal aortic atherosclerosis ([place]) 09/05/2016  . Dehydration 04/29/2016  . Protein-calorie malnutrition, severe 02/18/2016  . Constipation 02/17/2016  . Chronic hydrocele   . Asbestosis ([place])   . CKD (chronic kidney disease) stage 3, GFR 30-59 ml/min (HCC)   . Coagulopathy ([place])   . Ileus ([place])   . Long term current use of opiate analgesic 06/27/2015  . Neuropathic pain of both feet 05/03/2015  . Atrial fibrillation with rapid ventricular response ([place]) 11/09/2014  . Longstanding persistent atrial fibrillation ([place]) 10/11/2014  . Healthcare maintenance 01/25/2014  . BPH (benign prostatic hyperplasia) 01/20/2014  . PVD (peripheral vascular disease) ([place]) 12/10/2013  . Severe malnutrition ([place]) 05/18/2013  . Chronic combined systolic and diastolic congestive heart failure ([place]) 06/20/2010  . H/O Arterial embolism of leg  06/05/2010  . Nonischemic cardiomyopathy ([place]) 06/01/2010  . Major depressive disorder, recurrent episode ([place]) 01/19/2010  . History of hyperthyroidism 03/13/2009  . Bilateral hip pain 03/23/2007  . OSTEOARTHRITIS 09/05/2006  . COPD with acute  exacerbation ([place]) 08/28/2006  . History of tobacco abuse 03/01/2006  . Essential hypertension 03/01/2006  . Cardiac pacemaker in situ 03/01/2006    Past Surgical History:  Procedure Laterality Date  . CARDIAC CATHETERIZATION  2003   Nl cors, EF 35%  . CARDIOVERSION N/A 05/27/2013   Procedure: CARDIOVERSION;  Surgeon: Candee Furbish, MD;  Location: [facility] ENDOSCOPY;  Service: Cardiovascular;  Laterality: N/A;  . EMBOLECTOMY Right 12/10/2013   Procedure: Right Femoral Embolectomy; Fasciotomy Right Lower Leg with Intraoperative arteriogram;  Surgeon: Rosetta Posner, MD;  Location: [facility];  Service: Vascular;  Laterality: Right;  . EMBOLECTOMY Left 10/12/2014   Procedure: Left Femoral Thrombectomy with intraoperative arteriogram;  Surgeon: Rosetta Posner, MD;  Location: [facility];  Service: Vascular;  Laterality: Left;  . FASCIOTOMY CLOSURE Right 12/15/2013   Procedure: LEG FASCIOTOMY CLOSURE;  Surgeon: Rosetta Posner, MD;  Location: [facility];  Service: Vascular;  Laterality: Right;  . HEMIARTHROPLASTY HIP Left 09/09/2002  bipolar; Dr. Lafayette Dragon  . INSERT / REPLACE / REMOVE PACEMAKER  02/2003   Dr. Roe Rutherford, [employer] pulse generator identity SR model [id number] serial [id number]; gen change by Greggory Brandy  . INSERT / REPLACE / REMOVE PACEMAKER  06/17/2008   Lead and generator change w/ [employer] Tendril ST model [id number] (serial  [id number]).       . INTRAOPERATIVE ARTERIOGRAM Right 12/10/2013   Procedure: INTRA OPERATIVE ARTERIOGRAM;  Surgeon: Rosetta Posner, MD;  Location: [facility];  Service: Vascular;  Laterality: Right;  . JOINT REPLACEMENT    . TEE WITHOUT CARDIOVERSION N/A 05/27/2013   Procedure: TRANSESOPHAGEAL ECHOCARDIOGRAM (TEE);  Surgeon: Candee Furbish, MD;  Location: [facility] ENDOSCOPY;  Service: Cardiovascular;  Laterality: N/A;  . TOTAL HIP ARTHROPLASTY Right 03/08/2008    By Dr. Hiram Comber        Home Medications    Prior to Admission medications   Medication Sig Start Date End Date Taking?  Authorizing Provider  albuterol (VENTOLIN HFA) 108 (90 Base) MCG/ACT inhaler Inhale 1-2 puffs into the lungs every 6 (six) hours as needed for wheezing or shortness of breath. 08/13/17   Shela Leff, MD  apixaban (ELIQUIS) 2.5 MG TABS tablet Take 1 tablet (2.5 mg total) by mouth 2 (two) times daily. 06/19/17   Alphonzo Grieve, MD  aspirin 81 MG chewable tablet Chew 1 tablet (81 mg total) by mouth daily. 02/12/17   Zada Finders, MD  buPROPion (WELLBUTRIN SR) 150 MG 12 hr tablet Take 1 tablet (150 mg total) by mouth 2 (two) times daily. 08/14/17   Shela Leff, MD  diltiazem (CARDIZEM CD) 120 MG 24 hr capsule Take 1 capsule (120 mg total) by mouth daily. 08/19/17   Zada Finders, MD  dronabinol (MARINOL) 10 MG capsule Take 10 mg by mouth 2 (two) times daily before a meal.    [provider]  ENSURE (ENSURE) Take 237 mLs by mouth 2 (two) times daily.    [provider]  linaclotide Rolan Lipa) 145 MCG CAPS capsule Take 1 capsule (145 mcg total) by mouth daily before breakfast. 08/21/17   Shela Leff, MD  metoprolol succinate (TOPROL-XL) 50 MG 24 hr tablet Take 1 tablet (50 mg total) by mouth every 12 (twelve) hours. Take with or immediately following a meal. 06/19/17   Alphonzo Grieve, MD  oxyCODONE-acetaminophen (PERCOCET) 10-325 MG tablet Take 1 tablet by mouth every 4 (four) hours as needed for pain. Do not take more than 5 tablets in 24 hours. 07/29/17   Lacroce, Hulen Shouts, MD  polyethylene glycol (MIRALAX / GLYCOLAX) packet Take 17 g by mouth 2 (two) times daily. 07/27/17   Lacroce, Hulen Shouts, MD  rosuvastatin (CRESTOR) 20 MG tablet TAKE 1 TABLET EVERY DAY AT 6 PM Patient taking differently: Take 20 mg by mouth daily at 6 PM.  07/04/17   Zada Finders, MD  senna-docusate (SENOKOT-S) 8.6-50 MG tablet Take 2 tablets by mouth 2 (two) times daily. 07/27/17   Melanee Spry, MD  tiotropium (SPIRIVA) 18 MCG inhalation capsule Place 1 capsule (18 mcg total) into inhaler and  inhale daily. 07/19/17   Melanee Spry, MD    Family History Family History  Problem Relation Age of Onset  . Hypertension Mother   . Asthma Mother   . Coronary artery disease Mother   . Cancer Mother   . Stroke Mother   . Cancer Brother        Unsure of type.  [name] Heart disease Brother   .  Heart attack Brother   . Heart attack Sister   . Colon cancer Neg Hx   . Lung cancer Neg Hx   . Prostate cancer Neg Hx     Social History Social History   Tobacco Use  . Smoking status: Current Some Day Smoker    Packs/day: 0.25    Years: 54.00    Pack years: 13.50    Types: Cigarettes    Start date: 28  . Smokeless tobacco: Current User  . Tobacco comment: cutting back  Substance Use Topics  . Alcohol use: No    Alcohol/week: 0.0 oz    Comment: History of ETOH Abuse."  . Drug use: Yes    Frequency: 1.0 times per week    Types: Marijuana    Comment: Every other day.      Allergies   Amiodarone; Tape; and Penicillins   Review of Systems Review of Systems  Constitutional: Negative for fever.  Respiratory: Positive for cough and shortness of breath.   Cardiovascular: Positive for chest pain.  Gastrointestinal: Positive for abdominal pain and constipation. Negative for abdominal distention and vomiting.  Musculoskeletal: Positive for neck pain. Negative for arthralgias and back pain.  Neurological: Positive for syncope, weakness, light-headedness and headaches.  All other systems reviewed and are negative.    Physical Exam Updated Vital Signs BP 91/75   Pulse (!) 55   Temp 98.3 F (36.8 C)   Resp 14   SpO2 100%   Physical Exam  Constitutional: He is oriented to person, place, and time. He appears well-developed and well-nourished. Cervical collar in place.  HENT:  Head: Normocephalic.    Right Ear: External ear normal.  Left Ear: External ear normal.  Nose: Nose normal.  Eyes: Right eye exhibits no discharge. Left eye exhibits no discharge.  Neck: Neck  supple. Spinous process tenderness and muscular tenderness (left sided) present.    Cardiovascular: Normal heart sounds. An irregular rhythm present. Tachycardia present.  Pulmonary/Chest: Effort normal. He has decreased breath sounds in the left lower field. He has no wheezes.  Abdominal: Soft. He exhibits no distension. There is tenderness (mild) in the suprapubic area.  Genitourinary:  Genitourinary Comments: Soft brown stool in rectal vault, multiple cm from anus.   Musculoskeletal: He exhibits no edema.       Right hip: He exhibits normal range of motion.       Left hip: He exhibits normal range of motion.       Cervical back: He exhibits no tenderness.       Thoracic back: He exhibits no tenderness.       Lumbar back: He exhibits no tenderness.  Neurological: He is alert and oriented to person, place, and time.  CN 3-12 grossly intact. 5/5 strength in all 4 extremities. Grossly normal sensation. Normal finger to nose.   Skin: Skin is warm and dry.  Nursing note and vitals reviewed.    ED Treatments / Results  Labs (all labs ordered are listed, but only abnormal results are displayed) Labs Reviewed  COMPREHENSIVE METABOLIC PANEL - Abnormal; Notable for the following components:      Result Value   CO2 20 (*)    BUN 36 (*)    Creatinine, Ser 1.83 (*)    Calcium 8.8 (*)    Total Protein 5.8 (*)    Albumin 3.3 (*)    ALT 16 (*)    GFR calc non Af Amer 35 (*)    GFR calc Af Amer 41 (*)  All other components within normal limits  CBC WITH DIFFERENTIAL/PLATELET - Abnormal; Notable for the following components:   Platelets 130 (*)    All other components within normal limits  URINALYSIS, ROUTINE W REFLEX MICROSCOPIC - Abnormal; Notable for the following components:   Leukocytes, UA MODERATE (*)    Bacteria, UA RARE (*)    Squamous Epithelial / LPF 0-5 (*)    All other components within normal limits  TROPONIN I    EKG EKG Interpretation  Date/Time:  Friday August 22 2017 12:29:49 EDT Ventricular Rate:  133 PR Interval:    QRS Duration: 100 QT Interval:  297 QTC Calculation: 442 R Axis:   -22 Text Interpretation:  Atrial fibrillation with RVR Inferior infarct, old Abnrm T, consider ischemia, anterolateral lds rate is faster compared to Mar 2019 Confirmed by Sherwood Gambler [phone]) on 08/22/2017 2:11:40 PM   Radiology Dg Chest 2 View  Result Date: 08/22/2017 CLINICAL DATA:  Cough for 1 week.  Intermittent fever. EXAM: CHEST - 2 VIEW COMPARISON:  07/21/2017 FINDINGS: The cardiomediastinal silhouette is unchanged with left-sided mediastinal shift again seen. A pacemaker remains in place with leads 2 leads terminating in the right ventricle. Aortic atherosclerosis is noted. There is chronic left lung volume loss with pleural thickening extending over the apex and with associated calcified pleural plaques. Chronic architectural distortion and parenchymal opacities in the left mid to lower lung are similar to the prior study. Chronic 13 mm nodular density projecting over the right lung is unchanged and may represent a nipple shadow. The right lung is hyperinflated and otherwise clear. No pneumothorax or acute osseous abnormality is identified. IMPRESSION: Chronic changes including pleural plaques and left lung volume loss without evidence of acute abnormality. Electronically Signed   By: Logan Bores M.D.   On: 08/22/2017 13:51   Ct Head Wo Contrast  Result Date: 08/22/2017 CLINICAL DATA:  Pt woke up this am and fell striking head having weakness and dizziness and generally feels unwell per pt EXAM: CT HEAD WITHOUT CONTRAST CT CERVICAL SPINE WITHOUT CONTRAST TECHNIQUE: Multidetector CT imaging of the head and cervical spine was performed following the standard protocol without intravenous contrast. Multiplanar CT image reconstructions of the cervical spine were also generated. COMPARISON:  09/19/2015 and previous FINDINGS: CT HEAD FINDINGS Brain: No evidence of acute  infarction, hemorrhage, hydrocephalus, extra-axial collection or mass lesion/mass effect. Mild atrophy. Vascular: No hyperdense vessel or unexpected calcification. Skull: Normal. Negative for fracture or focal lesion. Sinuses/Orbits: No acute finding. Other: None. CT CERVICAL SPINE FINDINGS Alignment: Normal. Skull base and vertebrae: No acute fracture. No primary bone lesion or focal pathologic process. Soft tissues and spinal canal: No prevertebral fluid or swelling. No visible canal hematoma. Disc levels: Narrowing of interspaces C3-C7 with posterior endplate spurring at all levels. Uncovertebral hypertrophy results in bilateral foraminal encroachment C3-C7. Upper chest: Emphysematous changes in the visualized apices with asymmetric pleuroparenchymal scarring at the left lung apex. Right subclavian transvenous leads are partially visualized. Other: Bilateral calcified carotid bifurcation plaque. IMPRESSION: 1. Negative for bleed or other acute intracranial process. 2. Negative for cervical fracture or dislocation. 3. Cervical degenerative change C3-C7 as above. 4. Bilateral carotid bifurcation plaque. Electronically Signed   By: Lucrezia Europe M.D.   On: 08/22/2017 14:10   Ct Cervical Spine Wo Contrast  Result Date: 08/22/2017 CLINICAL DATA:  Pt woke up this am and fell striking head having weakness and dizziness and generally feels unwell per pt EXAM: CT HEAD WITHOUT CONTRAST CT CERVICAL  SPINE WITHOUT CONTRAST TECHNIQUE: Multidetector CT imaging of the head and cervical spine was performed following the standard protocol without intravenous contrast. Multiplanar CT image reconstructions of the cervical spine were also generated. COMPARISON:  09/19/2015 and previous FINDINGS: CT HEAD FINDINGS Brain: No evidence of acute infarction, hemorrhage, hydrocephalus, extra-axial collection or mass lesion/mass effect. Mild atrophy. Vascular: No hyperdense vessel or unexpected calcification. Skull: Normal. Negative for  fracture or focal lesion. Sinuses/Orbits: No acute finding. Other: None. CT CERVICAL SPINE FINDINGS Alignment: Normal. Skull base and vertebrae: No acute fracture. No primary bone lesion or focal pathologic process. Soft tissues and spinal canal: No prevertebral fluid or swelling. No visible canal hematoma. Disc levels: Narrowing of interspaces C3-C7 with posterior endplate spurring at all levels. Uncovertebral hypertrophy results in bilateral foraminal encroachment C3-C7. Upper chest: Emphysematous changes in the visualized apices with asymmetric pleuroparenchymal scarring at the left lung apex. Right subclavian transvenous leads are partially visualized. Other: Bilateral calcified carotid bifurcation plaque. IMPRESSION: 1. Negative for bleed or other acute intracranial process. 2. Negative for cervical fracture or dislocation. 3. Cervical degenerative change C3-C7 as above. 4. Bilateral carotid bifurcation plaque. Electronically Signed   By: Lucrezia Europe M.D.   On: 08/22/2017 14:10    Procedures .Critical Care Performed by: Sherwood Gambler, MD Authorized by: Sherwood Gambler, MD   Critical care provider statement:    Critical care time (minutes):  30   Critical care time was exclusive of:  Separately billable procedures and treating other patients   Critical care was necessary to treat or prevent imminent or life-threatening deterioration of the following conditions:  Circulatory failure, shock and renal failure   Critical care was time spent personally by me on the following activities:  Development of treatment plan with patient or surrogate, discussions with consultants, evaluation of patient's response to treatment, examination of patient, obtaining history from patient or surrogate, ordering and performing treatments and interventions, ordering and review of laboratory studies, ordering and review of radiographic studies, pulse oximetry, re-evaluation of patient's condition and review of old  charts   (including critical care time)  Medications Ordered in ED Medications  diltiazem (CARDIZEM) 1 mg/mL load via infusion 10 mg (10 mg Intravenous Bolus from Bag 08/22/17 1317)    And  diltiazem (CARDIZEM) 100 mg in dextrose 5% 134m (1 mg/mL) infusion (7.5 mg/hr Intravenous Rate/Dose Change 08/22/17 1537)  sodium chloride 0.9 % bolus 500 mL (0 mLs Intravenous Stopped 08/22/17 1404)  oxyCODONE-acetaminophen (PERCOCET/ROXICET) 5-325 MG per tablet 1 tablet (1 tablet Oral Given 08/22/17 1323)    And  oxyCODONE (Oxy IR/ROXICODONE) immediate release tablet 5 mg (5 mg Oral Given 08/22/17 1323)  sodium chloride 0.9 % bolus 500 mL (500 mLs Intravenous New Bag/Given 08/22/17 1537)     Initial Impression / Assessment and Plan / ED Course  I have reviewed the triage vital signs and the nursing notes.  Pertinent labs & imaging results that were available during my care of the patient were reviewed by me and considered in my medical decision making (see chart for details).     Patient's HR has improved with diltiazem and fluids. Some AKI c/w dehydration. No distress. CT head/c-spine benign after fall from syncope. Syncope likely from afib for 3 days in RVR. Trop negative. BP has remained soft but pulses strong, no signs of shock on exam. Gentle fluids, continue dilt. Internal medicine teaching service to admit.   Final Clinical Impressions(s) / ED Diagnoses   Final diagnoses:  Atrial fibrillation with RVR ([place]  Acute kidney injury [facility])    ED Discharge Orders    None       Sherwood Gambler, MD 08/22/17 2258

## 2017-08-23 DIAGNOSIS — Z9181 History of falling: Secondary | ICD-10-CM

## 2017-08-23 DIAGNOSIS — I130 Hypertensive heart and chronic kidney disease with heart failure and stage 1 through stage 4 chronic kidney disease, or unspecified chronic kidney disease: Secondary | ICD-10-CM | POA: Diagnosis not present

## 2017-08-23 DIAGNOSIS — I9589 Other hypotension: Secondary | ICD-10-CM | POA: Diagnosis not present

## 2017-08-23 DIAGNOSIS — I495 Sick sinus syndrome: Secondary | ICD-10-CM

## 2017-08-23 DIAGNOSIS — I4891 Unspecified atrial fibrillation: Secondary | ICD-10-CM | POA: Diagnosis not present

## 2017-08-23 DIAGNOSIS — N179 Acute kidney failure, unspecified: Secondary | ICD-10-CM | POA: Diagnosis not present

## 2017-08-23 DIAGNOSIS — K5909 Other constipation: Secondary | ICD-10-CM

## 2017-08-23 DIAGNOSIS — Z7901 Long term (current) use of anticoagulants: Secondary | ICD-10-CM

## 2017-08-23 DIAGNOSIS — I739 Peripheral vascular disease, unspecified: Secondary | ICD-10-CM | POA: Diagnosis not present

## 2017-08-23 DIAGNOSIS — J449 Chronic obstructive pulmonary disease, unspecified: Secondary | ICD-10-CM

## 2017-08-23 DIAGNOSIS — R55 Syncope and collapse: Secondary | ICD-10-CM | POA: Diagnosis not present

## 2017-08-23 DIAGNOSIS — Z950 Presence of cardiac pacemaker: Secondary | ICD-10-CM

## 2017-08-23 DIAGNOSIS — N183 Chronic kidney disease, stage 3 (moderate): Secondary | ICD-10-CM

## 2017-08-23 DIAGNOSIS — I5020 Unspecified systolic (congestive) heart failure: Secondary | ICD-10-CM | POA: Diagnosis not present

## 2017-08-23 DIAGNOSIS — Z8719 Personal history of other diseases of the digestive system: Secondary | ICD-10-CM

## 2017-08-23 DIAGNOSIS — G8929 Other chronic pain: Secondary | ICD-10-CM

## 2017-08-23 DIAGNOSIS — Z79899 Other long term (current) drug therapy: Secondary | ICD-10-CM

## 2017-08-23 DIAGNOSIS — F329 Major depressive disorder, single episode, unspecified: Secondary | ICD-10-CM

## 2017-08-23 DIAGNOSIS — Z9981 Dependence on supplemental oxygen: Secondary | ICD-10-CM

## 2017-08-23 LAB — COMPREHENSIVE METABOLIC PANEL
ALBUMIN: 2.8 g/dL — AB (ref 3.5–5.0)
ALK PHOS: 47 U/L (ref 38–126)
ALT: 14 U/L — AB (ref 17–63)
AST: 17 U/L (ref 15–41)
Anion gap: 8 (ref 5–15)
BUN: 26 mg/dL — ABNORMAL HIGH (ref 6–20)
CALCIUM: 8.3 mg/dL — AB (ref 8.9–10.3)
CHLORIDE: 112 mmol/L — AB (ref 101–111)
CO2: 22 mmol/L (ref 22–32)
CREATININE: 1.3 mg/dL — AB (ref 0.61–1.24)
GFR calc Af Amer: >60 mL/min (ref >60)
GFR calc non Af Amer: 53 mL/min — ABNORMAL LOW (ref >60)
GLUCOSE: 78 mg/dL (ref 65–99)
Potassium: 4.1 mmol/L (ref 3.5–5.1)
Sodium: 142 mmol/L (ref 135–145)
Total Bilirubin: 1 mg/dL (ref 0.3–1.2)
Total Protein: 4.9 g/dL — ABNORMAL LOW (ref 6.5–8.1)

## 2017-08-23 MED ORDER — DILTIAZEM HCL ER COATED BEADS 120 MG PO CP24: 120.0000 mg | ORAL_CAPSULE | Freq: Every day | ORAL | Status: DC

## 2017-08-23 MED ORDER — DILTIAZEM HCL-DEXTROSE 100-5 MG/100ML-% IV SOLN (PREMIX)
5.0000 mg/h | INTRAVENOUS | Status: DC
  Filled 2017-08-23: qty 100

## 2017-08-23 MED ORDER — SODIUM CHLORIDE 0.9 % IV SOLN
INTRAVENOUS | Status: AC
  Administered 2017-08-23 (×2): via INTRAVENOUS

## 2017-08-23 NOTE — Progress Notes (Signed)
PT Cancellation Note  Patient Details Name: [name] MRN: [id number] DOB: 06-Jan-1946   Cancelled Treatment:    Reason Eval/Treat Not Completed: (pt refused.). Pt BPs have been running soft. Pt BP 99/51. Pt HR at 105 at rest. Pt refused PT stating "my body doesn't even feel like rolling over right now". Pt educated on benefit of OOB mobility and role of PT. Pt reports "we will do it tomorrow." Acute PT to return as able to complete PT eval.  Kittie Plater, PT, DPT Pager #: [phone] Office #: [phone]    [place] 08/23/2017, 12:11 PM

## 2017-08-23 NOTE — Progress Notes (Signed)
Initial Nutrition Assessment  DOCUMENTATION CODES:   Severe malnutrition in context of chronic illness, Underweight  INTERVENTION:  - Continue Boost Breeze TID, each supplement provides 250 kcal and 9 grams of protein. - Will order Magic Cup BID with meals, each supplement provides 290 kcal and 9 grams of protein. - Continue to encourage PO intakes.   NUTRITION DIAGNOSIS:   Severe Malnutrition related to chronic illness(COPD) as evidenced by severe muscle depletion, severe fat depletion.  GOAL:   Patient will meet greater than or equal to 90% of their needs  MONITOR:   PO intake, Supplement acceptance, Weight trends, Labs  REASON FOR ASSESSMENT:   Malnutrition Screening Tool  ASSESSMENT:   72 yo M with a past medical history of COPD (on home 3 L home O2 prn), a fib, HFrEF, HTN, CKD III, tachy-brady s/p pacemaker, PVD, depression, chronic pain who presented to the ED with complaints of light-headedness and a fall.   Pt reports fair appetite now and at baseline and that many days he will eat smaller meals that consist of items such as a sandwich or soup. Pt is very addiment about being allowed to eat whatever he wants. Pt has had Boost Breeze this admission and during past admissions and likes this supplement. He has also had [employer] and likes that supplement also.   NFPE is outlined below. He states that he has lost a significant amount of weight in the past few years but is unable to quantify amount of weight lost or a more descriptive time frame. He is unsure if he has lost any of this weight in the past few months. Per chart review, weight has been mainly since since at least 10/23/16.   Reviewed Dr. Marita Snellen note from yesterday evening which states pt reported several weeks of lightheadedness; it is thought that this may be, in part, related to prolonged poor oral intakes. Pt with hx of constipation and impaction with questionable bowel regimen adherence at home.  Medications  reviewed; 1 packet Miralax BID, 2 tablets Senokot BID. Labs reviewed; Cl: 112 mmol/L, BUN: 26 mg/dL, creatinine: 1.3 mg/dL, Ca: 8.3 mg/dL, GFR: 53 mL/min.   IVF: NS @ 100 mL/hr.      NUTRITION - FOCUSED PHYSICAL EXAM:    Most Recent Value  Orbital Region  Moderate depletion  Upper Arm Region  Severe depletion  Thoracic and Lumbar Region  Severe depletion  Buccal Region  Moderate depletion  Temple Region  Moderate depletion  Clavicle Bone Region  Severe depletion  Clavicle and Acromion Bone Region  Severe depletion  Scapular Bone Region  Unable to assess  Dorsal Hand  Mild depletion  Patellar Region  Unable to assess  Anterior Thigh Region  Unable to assess  Posterior Calf Region  Unable to assess  Edema (RD Assessment)  Unable to assess  Hair  Reviewed  Eyes  Reviewed  Mouth  Reviewed  Skin  Reviewed  Nails  Reviewed       Diet Order:  Diet regular Room service appropriate? Yes; Fluid consistency: Thin  EDUCATION NEEDS:   No education needs have been identified at this time  Skin:  Skin Assessment: Reviewed RN Assessment  Last BM:  4/5  Height:   Ht Readings from Last 1 Encounters:  08/22/17 6\' 3"  (1.905 m)    Weight:   Wt Readings from Last 1 Encounters:  08/22/17 132 lb 0.9 oz (59.9 kg)    Ideal Body Weight:  89.09 kg  BMI:  Body mass  index is 16.51 kg/m.  Estimated Nutritional Needs:   Kcal:  1800-2100 (30-35 kcal/kg)  Protein:  70-80 grams  Fluid:  >/= 1.8 L/day      Jarome Matin, MS, RD, LDN, [facility] Inpatient Clinical Dietitian Pager # [phone] After hours/weekend pager # [phone]

## 2017-08-23 NOTE — Progress Notes (Signed)
  Date: 08/23/2017  Patient name: [name]  Medical record number: [id number]  Date of birth: [birth year]   I have seen and evaluated [name] and discussed their care with the Residency Team.  In brief, patient is a 72 year old male with a past medical history of COPD on home oxygen, A. fib, heart failure with reduced ejection fraction (EF 30%), hypertension, CKD stage III, tachybradycardia syndrome status post pacemaker placement, PVD, depression, chronic pain who presented to the ED with complaints of lightheadedness and a fall yesterday.  Patient states that he has been feeling lightheaded over the last 2 weeks especially while going from laying to standing position and resolves when lying back down.  He denies any vertigo, no chest pain, no palpitations, no  focal weakness, no headache, no blurry vision, no fevers or chills, no sick contacts, no nausea or vomiting, no diarrhea, no abdominal pain.  Patient states that yesterday he got up from his bed and felt lightheaded, fell to the ground and hit his head.  Patient also states that he is only intermittently compliant with his medications and did not take his medications for approximately 24 hours when he fell.    Today patient states that he still has some mild lightheadedness.  No other complaints at this time.  PMHx, Fam Hx, and/or Soc Hx : As per resident admit note  Vitals:   08/23/17 1008 08/23/17 1138  BP: 94/75 99/81  Pulse:  93  Resp: 19 20  Temp:  98.2 F (36.8 C)  SpO2:  94%   General: Awake alert and oriented x3, NAD CVS: Irregularly irregular, normal heart sounds, normal rate Lungs: CTA bilaterally Abdomen: Soft, nontender, nondistended, normoactive bowel sounds Extremities: No edema noted  Assessment and Plan: I have seen and evaluated the patient as outlined above. I agree with the formulated Assessment and Plan as detailed in the residents' note, with the following changes:   1.  Near syncope/fall: -Patient  presented to the ED with a history of lightheadedness on standing and a fall on the day of admission and was found to have A. fib with RVR and borderline hypotension.  I suspect that the etiology of his near syncope is likely multifactorial including intermittent compliance of his medications resulting in A. fib with RVR as well as chronic hypotension and poor oral intake leading to dehydration. -Patient was noted to have AKI on CKD which is likely prerenal in nature and improved with IV hydration -Continue with home metoprolol dosing.  Patient heart rate is now much better controlled.  We will hold his home diltiazem dose at this point given his borderline blood pressures. -Patient had a head CT on admission given his fall and history of hitting his head which showed no evidence of an intracranial bleed.  Will continue with Eliquis for now. -Patient will need orthostatic vital signs done today. -We will continue with IV hydration -Patient refused to work with PT -If patient's orthostatics are within normal limits then he can likely be discharged home today with close outpatient follow-up in [facility]  Aldine Contes, MD 4/6/20191:48 PM

## 2017-08-23 NOTE — Progress Notes (Signed)
Pt's BP sustaining 44'I systolic, IV diltiazem stopped. MD paged with updates. Order received to discontinue drip. HR sustaining in the 70's, A. Fib. IVF infusing at 35ml/hr. Will continue to monitor closely.  Jacqlyn Larsen, RN

## 2017-08-23 NOTE — Progress Notes (Signed)
Notified MD of pt heart rate sustaining 110s-150s while lying in bed.  Pt reports dizziness.  IVF and dilt gtt restarted per orders.  Will continue to monitor closely.

## 2017-08-23 NOTE — Care Management Obs Status (Signed)
MEDICARE OBSERVATION STATUS NOTIFICATION   Patient Details  Name: NORTON BIVINS MRN: [id number] Date of Birth: 1946-03-21   Medicare Observation Status Notification Given:  Yes(Verified correct patient-patient read general copy. x placed in signature area. )    Graves-Bigelow, Ocie Cornfield, RN 08/23/2017, 2:48 PM

## 2017-08-23 NOTE — Discharge Instructions (Addendum)
Information on my medicine - ELIQUIS (apixaban)  Why was Eliquis prescribed for you? Eliquis was prescribed for you to reduce the risk of forming blood clots that can cause a stroke if you have a medical condition called atrial fibrillation (a type of irregular heartbeat) OR to reduce the risk of a blood clots forming after orthopedic surgery.  What do You need to know about Eliquis ? Take your Eliquis TWICE DAILY - one tablet in the morning and one tablet in the evening with or without food.  It would be best to take the doses about the same time each day.  If you have difficulty swallowing the tablet whole please discuss with your pharmacist how to take the medication safely.  Take Eliquis exactly as prescribed by your doctor and DO NOT stop taking Eliquis without talking to the doctor who prescribed the medication.  Stopping may increase your risk of developing a new clot or stroke.  Refill your prescription before you run out.  After discharge, you should have regular check-up appointments with your healthcare provider that is prescribing your Eliquis.  In the future your dose may need to be changed if your kidney function or weight changes by a significant amount or as you get older.  What do you do if you miss a dose? If you miss a dose, take it as soon as you remember on the same day and resume taking twice daily.  Do not take more than one dose of ELIQUIS at the same time.  Important Safety Information A possible side effect of Eliquis is bleeding. You should call your healthcare provider right away if you experience any of the following: ? Bleeding from an injury or your nose that does not stop. ? Unusual colored urine (red or dark brown) or unusual colored stools (red or black). ? Unusual bruising for unknown reasons. ? A serious fall or if you hit your head (even if there is no bleeding).  Some medicines may interact with Eliquis and might increase your risk of bleeding  or clotting while on Eliquis. To help avoid this, consult your healthcare provider or pharmacist prior to using any new prescription or non-prescription medications, including herbals, vitamins, non-steroidal anti-inflammatory drugs (NSAIDs) and supplements.  This website has more information on Eliquis (apixaban): [email or web address].    Heart-Healthy Eating Plan Heart-healthy meal planning includes:  Limiting unhealthy fats.  Increasing healthy fats.  Making other small dietary changes.  You may need to talk with your doctor or a diet specialist (dietitian) to create an eating plan that is right for you. What types of fat should I choose?  Choose healthy fats. These include olive oil and canola oil, flaxseeds, walnuts, almonds, and seeds.  Eat more omega-3 fats. These include salmon, mackerel, sardines, tuna, flaxseed oil, and ground flaxseeds. Try to eat fish at least twice each week.  Limit saturated fats. ? Saturated fats are often found in animal products, such as meats, butter, and cream. ? Plant sources of saturated fats include palm oil, palm kernel oil, and coconut oil.  Avoid foods with partially hydrogenated oils in them. These include stick margarine, some tub margarines, cookies, crackers, and other baked goods. These contain trans fats. What general guidelines do I need to follow?  Check food labels carefully. Identify foods with trans fats or high amounts of saturated fat.  Fill one half of your plate with vegetables and green salads. Eat 4-5 servings of vegetables per day. A serving of vegetables is: ? 1  cup of raw leafy vegetables. ?  cup of raw or cooked cut-up vegetables. ?  cup of vegetable juice.  Fill one fourth of your plate with whole grains. Look for the word "whole" as the first word in the ingredient list.  Fill one fourth of your plate with lean protein foods.  Eat 4-5 servings of fruit per day. A serving of fruit is: ? One medium whole  fruit. ?  cup of dried fruit. ?  cup of fresh, frozen, or canned fruit. ?  cup of 100% fruit juice.  Eat more foods that contain soluble fiber. These include apples, broccoli, carrots, beans, peas, and barley. Try to get 20-30 g of fiber per day.  Eat more home-cooked food. Eat less restaurant, buffet, and fast food.  Limit or avoid alcohol.  Limit foods high in starch and sugar.  Avoid fried foods.  Avoid frying your food. Try baking, boiling, grilling, or broiling it instead. You can also reduce fat by: ? Removing the skin from poultry. ? Removing all visible fats from meats. ? Skimming the fat off of stews, soups, and gravies before serving them. ? Steaming vegetables in water or broth.  Lose weight if you are overweight.  Eat 4-5 servings of nuts, legumes, and seeds per week: ? One serving of dried beans or legumes equals  cup after being cooked. ? One serving of nuts equals 1 ounces. ? One serving of seeds equals  ounce or one tablespoon.  You may need to keep track of how much salt or sodium you eat. This is especially true if you have high blood pressure. Talk with your doctor or dietitian to get more information. What foods can I eat? Grains Breads, including [place], white, pita, wheat, raisin, rye, oatmeal, and [place]. Tortillas that are neither fried nor made with lard or trans fat. Low-fat rolls, including hotdog and hamburger buns and English muffins. Biscuits. Muffins. Waffles. Pancakes. Light popcorn. Whole-grain cereals. Flatbread. Melba toast. Pretzels. Breadsticks. Rusks. Low-fat snacks. Low-fat crackers, including oyster, saltine, matzo, graham, animal, and rye. Rice and pasta, including brown rice and pastas that are made with whole wheat. Vegetables All vegetables. Fruits All fruits, but limit coconut. Meats and Other Protein Sources Lean, well-trimmed beef, veal, pork, and lamb. Chicken and [place] without skin. All fish and shellfish. Wild duck,  rabbit, pheasant, and venison. Egg whites or low-cholesterol egg substitutes. Dried beans, peas, lentils, and tofu. Seeds and most nuts. Dairy Low-fat or nonfat cheeses, including ricotta, string, and mozzarella. Skim or 1% milk that is liquid, powdered, or evaporated. Buttermilk that is made with low-fat milk. Nonfat or low-fat yogurt. Beverages Mineral water. Diet carbonated beverages. Sweets and Desserts Sherbets and fruit ices. Honey, jam, marmalade, jelly, and syrups. Meringues and gelatins. Pure sugar candy, such as hard candy, jelly beans, gumdrops, mints, marshmallows, and small amounts of dark chocolate. [employer] Eat all sweets and desserts in moderation. Fats and Oils Nonhydrogenated (trans-free) margarines. Vegetable oils, including soybean, sesame, sunflower, olive, peanut, safflower, corn, canola, and cottonseed. Salad dressings or mayonnaise made with a vegetable oil. Limit added fats and oils that you use for cooking, baking, salads, and as spreads. Other Cocoa powder. Coffee and tea. All seasonings and condiments. The items listed above may not be a complete list of recommended foods or beverages. Contact your dietitian for more options. What foods are not recommended? Grains Breads that are made with saturated or trans fats, oils, or whole milk. Croissants. Butter rolls. Cheese breads. Sweet  rolls. Donuts. Buttered popcorn. Chow mein noodles. High-fat crackers, such as cheese or butter crackers. Meats and Other Protein Sources Fatty meats, such as hotdogs, short ribs, sausage, spareribs, bacon, rib eye roast or steak, and mutton. High-fat deli meats, such as salami and bologna. Caviar. Domestic duck and goose. Organ meats, such as kidney, liver, sweetbreads, and heart. Dairy Cream, sour cream, cream cheese, and creamed cottage cheese. Whole-milk cheeses, including blue (bleu), Monterey Jack, [place], [place], American, [place], Swiss, cheddar, [place], and [place]. Whole or  2% milk that is liquid, evaporated, or condensed. Whole buttermilk. Cream sauce or high-fat cheese sauce. Yogurt that is made from whole milk. Beverages Regular sodas and juice drinks with added sugar. Sweets and Desserts Frosting. Pudding. Cookies. Cakes other than angel food cake. Candy that has milk chocolate or white chocolate, hydrogenated fat, butter, coconut, or unknown ingredients. Buttered syrups. Full-fat ice cream or ice cream drinks. Fats and Oils Gravy that has suet, meat fat, or shortening. Cocoa butter, hydrogenated oils, palm oil, coconut oil, palm kernel oil. These can often be found in baked products, candy, fried foods, nondairy creamers, and whipped toppings. Solid fats and shortenings, including bacon fat, salt pork, lard, and butter. Nondairy cream substitutes, such as coffee creamers and sour cream substitutes. Salad dressings that are made of unknown oils, cheese, or sour cream. The items listed above may not be a complete list of foods and beverages to avoid. Contact your dietitian for more information. This information is not intended to replace advice given to you by your health care provider. Make sure you discuss any questions you have with your health care provider. Document Released: 11/05/2011 Document Revised: 10/12/2015 Document Reviewed: 10/28/2013 Elsevier Interactive Patient Education  [employer].

## 2017-08-23 NOTE — Care Management Note (Addendum)
Case Management Note  Patient Details  Name: [name] MRN: [id number] Date of Birth: 1946/02/10  Subjective/Objective: Pt presented for Atrial Fib RVR- Initially from home alone and has support of daughter. Per pt he is active with [facility] for RN, PT, SW, Aide. CM received referral for [facility]-Resumption: MD-pt will need HH RN, PT, SW Aide order only if the status changes to inpatient status. If pt remains Observation Status he will not need orders.                  Action/Plan: CM did make [facility] aware that pt is hospitalized and that pt states he is having issues with DME 02 fill tank station. Pt has 02 3 L via [place]- [facility] aware and DME 02 will be serviced once he transitions home. Per pt his daughter helps with getting medications, groceries and taking him to appointments. Per pt his daughter will be able to provide transportation home. No further needs identified by CM at this time.   Expected Discharge Date:                  Expected Discharge Plan:  [place]  In-House Referral:  NA  Discharge planning Services  CM Consult  Post Acute Care Choice:  Home Health, Resumption of Svcs/PTA Provider, Durable Medical Equipment Choice offered to:  Patient  DME Arranged: Hospital Bed DME Agency: [place] Arranged:  RN, Disease Management, PT, Nurse's Aide, Social Work [employer] Agency:  [place]  Status of Service:  Completed, signed off  If discussed at [employer], dates discussed:    Additional Comments: 1308 08-26-17 Jacqlyn Krauss, RN, BSN [phone] CM did speak with patient in regards to DME- pt states he would benefit from [place] spoke with MD and agreeable. Order placed in [place]. Referral called to [facility]. Pt may transition home before bed can be delivered. No further needs at this time from CM.   1200 08-25-17 Jacqlyn Krauss, RN,BSN [phone] CM did speak with CSW in regards to disposition. Per  CSW-Pt would be open to SNF if he can get a bed at [facility]. CSW will look into these 2 facilities. In order for patient to have [facility]- pt will need to pay out of pocket for the services. CM will continue to monitor.  Bethena Roys, RN 08/23/2017, 2:54 PM

## 2017-08-23 NOTE — Progress Notes (Signed)
   Subjective: No acute events overnight following admission.  He reports feeling somewhat improved, still slightly lightheaded when sitting up.  No bowel movement yet.  During rounds, patient expressed a willingness to reconsider prior decline of SNF and PT ordered which he subsequently declined to work with.  Objective:  Vital signs in last 24 hours: Vitals:   08/23/17 0738 08/23/17 0840 08/23/17 1008 08/23/17 1138  BP: 102/66 (!) 81/69 94/75 99/81   Pulse:  (!) 145  93  Resp: 20 (!) 21 19 20   Temp:    98.2 F (36.8 C)  TempSrc:    Oral  SpO2:  100%  94%  Weight:      Height:       General: Frail elderly gentleman resting in bed comfortably , no acute distress HEENT: [place] in place, no evidence of trauma CV: Irregular rhythm, normal rate  Resp: Coarse breath sounds bilaterally without wheezing, normal work of breathing, no distress  Abd: Soft, +BS, mild lower abdominal tenderness, stable from prior Extr: No LE edema, low muscle mass  Neuro: Alert and oriented x3  Skin: Warm, dry     Assessment/Plan:  Atrial Fibrillation Patient presented and found to be in RVR in the setting of missed medication doses, dehydration.  A. fib may be explanation for near syncopal symptoms and follow-up as well.  He was initially treated with diltiazem drip which has been weaned off, rate control achieved with resumption of home metoprolol.  Home diltiazem being held for low BP at this point. --Tele, monitor vital signs --Cont home Metoprolol 50 mg BID --Hold home PO dilt, will resume as BP allows --Cont home Eliquis   Near Syncope, Fall, H/o Chronic Hypotension Presented with several week history of lightheadedness with positional changes, likely due to poor p.o. intake, chronic hypotension, atrial fibrillation.  He has received IV fluids and his rate has improved.  Has had subjective improvement, but blood pressure is still 17H systolic this morning.  Will continue fluids, can consider midodrine or  fludrocortisone has options to improve blood pressure if he remains symptomatic. --IVF at 100 cc/hr, monitor volume status given HF hx  --PT or ambulate with nurse --Orthostatics     Acute on Chronic Kidney Injury (CKD III) Creatinine on presentation elevated at 1.83, baseline appears to be 1.2-1.4.  Likely due to decreased p.o. intake which has been a chronic issue.  Creatinine has improved with initial fluids and currently consistent with baseline.  H/o Constipation, Impaction Chronic issues with constipation fecal impaction including recent admission in March, question of adherence to outpatient bowel regimen and out of MiraLAX for 2 days prior to admission.  Bowel regimen resumed with no bowel movement as of yet, continue to monitor. --Miralax BID, senna-docusate BID --Cont home Linaclotide   Dispo: Anticipated discharge in approximately 0-1 day(s).   Tawny Asal, MD 08/23/2017, 1:23 PM Pager: [phone]

## 2017-08-23 NOTE — Progress Notes (Signed)
Patient was admitted with afib with RVR Heart Rates in the 140s, he has been rate controlled over night and today with his home metoprolol.  He also had a fall at home, CT head negative.  Went to evaluate patient this afternoon.  Patient finished eating lunch and was watching TV sitting up comfortably in bed. Patient had declined working with PT this morning. Patient has declined to ambulate and do orthostatic vitals with the nurse. I asked patient how he is feeling. He states he is lightheaded and cannot ambulate at this time.  He states that part of the reason he cannot participate in the care is due to his chronic pain. I reminded him that he has his home pain medications available to him while inpatient. He states he will try and do orthostatics this afternoon.

## 2017-08-23 NOTE — Plan of Care (Signed)
  Problem: Pain Managment: Goal: General experience of comfort will improve Outcome: Progressing   

## 2017-08-24 ENCOUNTER — Observation Stay (HOSPITAL_COMMUNITY): Payer: Medicare Other

## 2017-08-24 MED ORDER — SODIUM CHLORIDE 0.9 % IV SOLN
INTRAVENOUS | Status: AC
  Administered 2017-08-24 (×2): via INTRAVENOUS

## 2017-08-24 MED ORDER — DILTIAZEM HCL ER COATED BEADS 120 MG PO CP24
120.0000 mg | ORAL_CAPSULE | Freq: Every day | ORAL | Status: DC
  Administered 2017-08-24 – 2017-08-26 (×3): 120 mg via ORAL
  Filled 2017-08-24 (×3): qty 1

## 2017-08-24 MED ORDER — NICOTINE 14 MG/24HR TD PT24
14.0000 mg | MEDICATED_PATCH | Freq: Every day | TRANSDERMAL | Status: DC
  Administered 2017-08-24 – 2017-08-26 (×3): 14 mg via TRANSDERMAL
  Filled 2017-08-24 (×3): qty 1

## 2017-08-24 NOTE — Progress Notes (Signed)
There was a soap suds enema ordered on the patient. He has had 3 large soft bowel movements today. He refused it and I notified Dr. Shan Levans of the 3 stools he has had for me today

## 2017-08-24 NOTE — Progress Notes (Signed)
Pt has had 2 episodes of vomiting this am. I notified MD, orders for abdominal xray received. Will continue to monitor

## 2017-08-24 NOTE — Progress Notes (Signed)
   Subjective: States he is still dizzy and has no energy.  Denies difficulty breathing or chest pain.  Had a bowel movement yesterday.  Objective:  Vital signs in last 24 hours: Vitals:   08/24/17 0029 08/24/17 0430 08/24/17 0603 08/24/17 0730  BP: 109/85 108/83  108/70  Pulse: (!) 115     Resp: (!) 25 10  10   Temp: 98.3 F (36.8 C)     TempSrc: Oral     SpO2:      Weight:   142 lb 13.7 oz (64.8 kg)   Height:       General: Frail elderly gentleman sitting up in bedside chair , no acute distress HEENT: [place] in place, no evidence of trauma CV: Irregular rhythm, normal rate  Resp: CTA B/L, normal work of breathing, no distress  Abd: Soft, +BS, mild lower abdominal tenderness, stable from prior Extr: No LE edema, low muscle mass  Neuro: Alert and oriented x3  Skin: Warm, dry     Assessment/Plan:  Atrial Fibrillation On admission patient was found to be in afib with RVR and was initially treated with diltiazem drip and home metoprolol and rate control achieved.  Home diltiazem was being held for low BP.  Unfortunately patient went back into afib with RVR overnight and dilt drip restarted.  Currently rates controlled, blood pressure stable.  Drip at 5, will transition to home oral dilt and continue with metoprolol.  --Tele, monitor vital signs --Cont home Metoprolol 50 mg BID -- wean dilt drip, restart home oral diltiazem --Cont home Eliquis   Near Syncope, Fall, H/o Chronic Hypotension Presented with several week history of lightheadedness with positional changes, likely due to poor p.o. intake, chronic hypotension, atrial fibrillation.  Head CT negative.  He has received IV fluids with improvement in blood pressures. Orthostatics positive. --continue IVF at 100 cc/hr, monitor volume status given HF hx  --PT or ambulate with nurse    Acute on Chronic Kidney Injury (CKD III) Creatinine on presentation elevated at 1.83, baseline appears to be 1.2-1.4.  Likely due to decreased p.o.  intake which has been a chronic issue.  Creatinine has improved with initial fluids and currently consistent with baseline. -moniter with BMETs  H/o Constipation, Impaction Chronic issues with constipation.  No impaction on admission.  Had bowel movement yesterday --Miralax BID, senna-docusate BID --Cont home Linaclotide   Tobacco abuse Requesting nicotine patch  Dispo: Anticipated discharge pending clinical improvement.   Kalman Shan Pottsgrove, DO 08/24/2017, 8:54 AM Pager: [phone]

## 2017-08-24 NOTE — Progress Notes (Signed)
Pt heart rate 70s - 80s with BP 108/83.  IV diltiazem infusing at 5 mg/hr.  Pt reports dizziness is present but decreased.  Will continue to monitor pt closely.

## 2017-08-24 NOTE — Evaluation (Signed)
Physical Therapy Evaluation Patient Details Name: [name] MRN: [id number] DOB: [birth year] Today's Date: 08/24/2017   History of Present Illness  72 yo M with a past medical history of COPD (on home 3 L home O2 prn), a fib, HFrEF, HTN, CKD III, tachy-brady s/p pacemaker, PVD, depression, chronic pain who presented to the ED with complaints of light-headedness and a fall.   Clinical Impression  PTA pt lived alone with intermittent help from his daughter. Pt reports that he was lateral scoot transferring bed to w/c but that some days he did not have enough energy and would not leave the bed. Pt is currently minA for bed mobility, and modAx2 for sit>stand and modA for stand pivot transfers. PT recommending SNF level rehab at d/c given his deconditioning and inability to adequately care for himself at this time. PT will continue to follow acutely.     Follow Up Recommendations SNF    Equipment Recommendations  None recommended by PT    Recommendations for Other Services       Precautions / Restrictions Precautions Precautions: Fall Restrictions Weight Bearing Restrictions: No RLE Weight Bearing: Non weight bearing(per pt since hip surgery) LLE Weight Bearing: Non weight bearing(per pt since hip surgery)      Mobility  Bed Mobility Overal bed mobility: Needs Assistance Bed Mobility: Supine to Sit     Supine to sit: Min assist     General bed mobility comments: heavy use of bed rail and required pulling against PT hand to bring trunk to upright, able to scoot EoB without assist, c/o of dizziness once in sitting  Transfers Overall transfer level: Needs assistance Equipment used: None Transfers: Sit to/from [facility] Sit to Stand: Mod assist;+2 physical assistance Stand pivot transfers: Mod assist       General transfer comment: modA for power up and steadying in standing, c/o of dizziness, stood long enough for BP measure and pivoted to BSC, modAx1 for  standing for pericare and stand pivot to recliner.       Balance Overall balance assessment: Needs assistance Sitting-balance support: Feet supported;Bilateral upper extremity supported Sitting balance-Leahy Scale: Fair     Standing balance support: Bilateral upper extremity supported Standing balance-Leahy Scale: Zero                               Pertinent Vitals/Pain Pain Assessment: 0-10 Pain Score: 8  Pain Location: stomach Pain Descriptors / Indicators: Cramping Pain Intervention(s): Repositioned;Monitored during session  Orthostatic BPs  Supine 108/70  Sitting 102/70  Standing 123/92   At rest HR 66 bpm, with standing HR 88 bpm  SaO2 on 3L O2 via  98%O2 throughout session    [place] expects to be discharged to:: Private residence Living Arrangements: Alone Available Help at Discharge: Family;Available PRN/intermittently Type of Home: Apartment Home Access: Elevator     Home Layout: One level Home Equipment: Walker - 4 wheels;Walker - 2 wheels;Cane - single point;Wheelchair - Education administrator (comment);Cane - quad;Shower seat;Grab bars - toilet;Grab bars - tub/shower      Prior Function Level of Independence: Needs assistance   Gait / Transfers Assistance Needed: uses w/c for mobility, has 4 wheeled walker but has been too weak to use in last 3 weeks, transfers bed to w/c, but has experienced falls in process  ADL's / Homemaking Assistance Needed: daughter assists with bathing, and iADLs,   Comments: Pt states that he has been spending  up to 3 days at a time in bed because he is too weak to get up,        Extremity/Trunk Assessment   Upper Extremity Assessment Upper Extremity Assessment: Generalized weakness    Lower Extremity Assessment Lower Extremity Assessment: Generalized weakness       Communication   Communication: No difficulties  Cognition Arousal/Alertness: Awake/alert Behavior During Therapy: WFL for  tasks assessed/performed Overall Cognitive Status: No family/caregiver present to determine baseline cognitive functioning                                 General Comments: pt alert and oriented, decreased awareness of deficits      General Comments General comments (skin integrity, edema, etc.): Pt currently incontinent of stool and requires cleaning by staff, pt reports that sometimes he is incontinent at home and has difficulty making it into bathroom to clean up         Assessment/Plan    PT Assessment Patient needs continued PT services  PT Problem List Decreased strength;Decreased activity tolerance;Decreased balance;Decreased mobility;Decreased safety awareness       PT Treatment Interventions DME instruction;Gait training;Functional mobility training;Therapeutic activities;Therapeutic exercise;Balance training;Patient/family education    PT Goals (Current goals can be found in the Care Plan section)  Acute Rehab PT Goals Patient Stated Goal: go home PT Goal Formulation: With patient Time For Goal Achievement: 09/07/17 Potential to Achieve Goals: Fair    Frequency Min 2X/week   Barriers to discharge Decreased caregiver support      Co-evaluation               AM-PAC PT "6 Clicks" Daily Activity  Outcome Measure Difficulty turning over in bed (including adjusting bedclothes, sheets and blankets)?: A Lot Difficulty moving from lying on back to sitting on the side of the bed? : Unable Difficulty sitting down on and standing up from a chair with arms (e.g., wheelchair, bedside commode, etc,.)?: Unable Help needed moving to and from a bed to chair (including a wheelchair)?: A Lot Help needed walking in hospital room?: Total Help needed climbing 3-5 steps with a railing? : Total 6 Click Score: 8    End of Session Equipment Utilized During Treatment: Gait belt Activity Tolerance: Patient limited by fatigue Patient left: in chair;with call  bell/phone within reach;with chair alarm set;with nursing/sitter in room Nurse Communication: Mobility status PT Visit Diagnosis: Unsteadiness on feet (R26.81);Other abnormalities of gait and mobility (R26.89);Repeated falls (R29.6);Muscle weakness (generalized) (M62.81);Difficulty in walking, not elsewhere classified (R26.2);Dizziness and giddiness (R42)    Time: 0109-3235 PT Time Calculation (min) (ACUTE ONLY): 23 min   Charges:   PT Evaluation $PT Eval Moderate Complexity: 1 Mod PT Treatments $Therapeutic Activity: 8-22 mins   PT G Codes:        Oland Arquette B. Migdalia Dk PT, DPT Acute Rehabilitation  [phone] Pager [phone]    [place] 08/24/2017, 10:21 AM

## 2017-08-25 DIAGNOSIS — F17200 Nicotine dependence, unspecified, uncomplicated: Secondary | ICD-10-CM

## 2017-08-25 DIAGNOSIS — R55 Syncope and collapse: Secondary | ICD-10-CM | POA: Diagnosis not present

## 2017-08-25 DIAGNOSIS — I9589 Other hypotension: Secondary | ICD-10-CM | POA: Diagnosis not present

## 2017-08-25 DIAGNOSIS — I4891 Unspecified atrial fibrillation: Secondary | ICD-10-CM | POA: Diagnosis not present

## 2017-08-25 DIAGNOSIS — Z8719 Personal history of other diseases of the digestive system: Secondary | ICD-10-CM | POA: Diagnosis not present

## 2017-08-25 DIAGNOSIS — Z9181 History of falling: Secondary | ICD-10-CM | POA: Diagnosis not present

## 2017-08-25 DIAGNOSIS — N179 Acute kidney failure, unspecified: Secondary | ICD-10-CM | POA: Diagnosis not present

## 2017-08-25 DIAGNOSIS — K5909 Other constipation: Secondary | ICD-10-CM | POA: Diagnosis not present

## 2017-08-25 DIAGNOSIS — R112 Nausea with vomiting, unspecified: Secondary | ICD-10-CM

## 2017-08-25 DIAGNOSIS — Z79899 Other long term (current) drug therapy: Secondary | ICD-10-CM | POA: Diagnosis not present

## 2017-08-25 MED ORDER — METOPROLOL SUCCINATE ER 50 MG PO TB24
75.0000 mg | ORAL_TABLET | Freq: Two times a day (BID) | ORAL | Status: DC
  Administered 2017-08-25 – 2017-08-26 (×2): 75 mg via ORAL
  Filled 2017-08-25 (×2): qty 1

## 2017-08-25 MED ORDER — ONDANSETRON HCL 4 MG PO TABS: 4.0000 mg | ORAL_TABLET | Freq: Three times a day (TID) | ORAL | Status: DC | PRN

## 2017-08-25 MED ORDER — ONDANSETRON 4 MG PO TBDP
4.0000 mg | ORAL_TABLET | Freq: Three times a day (TID) | ORAL | Status: DC | PRN
  Administered 2017-08-25: 4 mg via ORAL
  Filled 2017-08-25 (×2): qty 1

## 2017-08-25 NOTE — Progress Notes (Signed)
   Subjective: Episode of emesis noted last night and pt reports some residual nausea this morning. Had bowel movements yesterday. He states he is sometimes still slightly light-headed with position changes. BP improved. PT recommended SNF, on rounds pt stated he would like to go home, though indicated differently to CSW. He is otherwise stable for discharge.   Objective:  Vital signs in last 24 hours: Vitals:   08/25/17 0459 08/25/17 0755 08/25/17 0821 08/25/17 1114  BP: 114/80 108/86  113/87  Pulse: 94 88  92  Resp: 15 19    Temp:  98.2 F (36.8 C)    TempSrc:  Oral    SpO2:  99% 99%   Weight: 143 lb 11.8 oz (65.2 kg)     Height:       General: Frail elderly gentleman resting in bed comfortably, no acute distress CV: Irregular rhythm, normal rate  Resp: Stable breath sounds without wheezing, normal work of breathing, no distress  Abd: Soft, +BS, mild lower abdominal tenderness, stable from prior Extr: No LE edema, low muscle mass  Neuro: Alert and oriented x3  Skin: Warm, dry     Assessment/Plan:  Atrial Fibrillation On admission patient was found to be in afib with RVR and was initially treated with diltiazem drip and home metoprolol and rate control achieved.  Home diltiazem was being held for low BP. Home medications have been restarted with stable BP, no recurrent RVR overnight.  --Tele, monitor vital signs --Cont home Metoprolol 50 mg BID and diltiazem --Cont home Eliquis   Near Syncope, Fall, H/o Chronic Hypotension Presented with several week history of lightheadedness with positional changes, likely due to poor p.o. intake, chronic hypotension, atrial fibrillation.  Head CT negative.  He has received IV fluids with improvement in blood pressures. Orthostatics positive.  --PT or ambulate with nurse --F/u with disposition decision     Acute on Chronic Kidney Injury (CKD III) Creatinine on presentation elevated at 1.83, baseline appears to be 1.2-1.4.  Likely due to  decreased p.o. intake which has been a chronic issue.  Creatinine has improved with initial fluids and currently consistent with baseline. -moniter with BMETs  H/o Constipation, Impaction Chronic issues with constipation.  No impaction on admission.  Had bowel movements yesterday with one episode of emesis.  --Miralax BID, senna-docusate BID --Cont home Linaclotide  --Zofran prn   Tobacco abuse Requesting nicotine patch  Dispo: Anticipated discharge pending clarification of disposition plan.   Tawny Asal, MD 08/25/2017, 12:49 PM Pager: [phone]

## 2017-08-25 NOTE — Progress Notes (Signed)
Internal Medicine Attending:   I saw and examined the patient. I reviewed the resident's note and I agree with the resident's findings and plan as documented in the resident's note.  Patient still complains of some intermittent nausea and decreased oral intake.  He did have an episode of vomiting last night as well.  He also complains of some intermittent lightheadedness with position changes.  His blood pressure is now improved but he is intermittently tachycardic.  Patient was initially admitted for near syncope with a fall and was found to have A. fib with RVR as well as borderline hypotension.  Patient blood pressure responded well to IV fluids.  PT follow-up and recommendations appreciated.  They recommend patient go to SNF for rehab.  Patient initially refused this but now is willing to go to SNF if he gets a facility that he is comfortable with.  We will continue with his metoprolol and diltiazem for hi A. fib with RVR as well as his home Eliquis for any coagulation.  No further workup at this time.  Patient is stable for discharge to SNF once bed is available.

## 2017-08-25 NOTE — Plan of Care (Signed)
Patient had emesis x 1 during night, resolved

## 2017-08-25 NOTE — NC FL2 (Signed)
[place] LEVEL OF CARE SCREENING TOOL     IDENTIFICATION  Patient Name: [name] Birthdate: 1945/10/07 Sex: male Admission Date (Current Location): 08/22/2017  [facility] and [place] Number:  Herbalist and Address:  [facility] [place], [place] [address], [place], [place] [address]      Provider Number: [id number]  Attending Physician Name and Address:  Aldine Contes, MD  Relative Name and Phone Number:  [name], daughter, [phone]    Current Level of Care: Hospital Recommended Level of Care: [place] Prior Approval Number:    Date Approved/Denied:   PASRR Number: [id number]  Discharge Plan: SNF    Current Diagnoses: Patient Active Problem List   Diagnosis Date Noted  . Atrial fibrillation with RVR ([place]) 08/22/2017  . Nausea vomiting and diarrhea   . 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 Acute kidney injury ([place])   . Paroxysmal atrial fibrillation (HCC)   . Palliative care encounter   . DNR (do not resuscitate)   . Pressure injury of skin 07/15/2017  . Orthostatic dizziness 06/20/2017  . Fecal impaction ([place]) 10/30/2016  . History of MI (myocardial infarction)   . History of DVT (deep vein thrombosis)   . 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 Thrombocytopenia ([place]) 09/10/2016  . Abdominal aortic atherosclerosis ([place]) 09/05/2016  . Dehydration 04/29/2016  . Protein-calorie malnutrition, severe 02/18/2016  . Constipation 02/17/2016  . Chronic hydrocele   . Asbestosis ([place])   . CKD (chronic kidney disease) stage 3, GFR 30-59 ml/min (HCC)   . Coagulopathy ([place])   . Ileus ([place])   . Long term current use of opiate analgesic 06/27/2015  . Neuropathic pain of both feet 05/03/2015  . Atrial fibrillation with rapid ventricular response ([place]) 11/09/2014  . Longstanding persistent atrial fibrillation ([place]) 10/11/2014  . Healthcare maintenance 01/25/2014  . BPH (benign prostatic  hyperplasia) 01/20/2014  . PVD (peripheral vascular disease) ([place]) 12/10/2013  . Severe malnutrition ([place]) 05/18/2013  . Chronic combined systolic and diastolic congestive heart failure ([place]) 06/20/2010  . H/O Arterial embolism of leg  06/05/2010  . Nonischemic cardiomyopathy ([place]) 06/01/2010  . Major depressive disorder, recurrent episode ([place]) 01/19/2010  . History of hyperthyroidism 03/13/2009  . Bilateral hip pain 03/23/2007  . OSTEOARTHRITIS 09/05/2006  . COPD with acute exacerbation ([place]) 08/28/2006  . History of tobacco abuse 03/01/2006  . Essential hypertension 03/01/2006  . Cardiac pacemaker in situ 03/01/2006    Orientation RESPIRATION BLADDER Height & Weight     Self, Time, Situation, Place  O2(nasal cannula 3L) Continent Weight: 143 lb 11.8 oz (65.2 kg) Height:  6\' 3"  (190.5 cm)  BEHAVIORAL SYMPTOMS/MOOD NEUROLOGICAL BOWEL NUTRITION STATUS      Continent Diet(please see DC summary)  AMBULATORY STATUS COMMUNICATION OF NEEDS Skin   Extensive Assist Verbally Normal                       Personal Care Assistance Level of Assistance  Bathing, Feeding, Dressing Bathing Assistance: Limited assistance(moderate assist) Feeding assistance: Independent Dressing Assistance: Limited assistance(moderate assist)     Functional Limitations Info  Sight, Hearing, Speech Sight Info: Adequate Hearing Info: Adequate Speech Info: Adequate    SPECIAL CARE FACTORS FREQUENCY  PT (By licensed PT)     PT Frequency: 5x/week              Contractures Contractures Info: Not present    Additional Factors Info  Code  Status, Allergies, Psychotropic Code Status Info: DNR Allergies Info: Amiodarone, Tape, Latex, Penicillins Psychotropic Info: wellbutrin         Current Medications (08/25/2017):  This is the current hospital active medication list Current Facility-Administered Medications  Medication Dose Route Frequency Provider Last Rate Last Dose  . acetaminophen  (TYLENOL) tablet 650 mg  650 mg Oral Q6H PRN Kalman Shan Ratliff, DO       Or  . acetaminophen (TYLENOL) suppository 650 mg  650 mg Rectal Q6H PRN Hoffman, Jessica Ratliff, DO      . albuterol (PROVENTIL) (2.5 MG/3ML) 0.083% nebulizer solution 3 mL  3 mL Inhalation Q6H PRN Valinda Party, DO      . apixaban (ELIQUIS) tablet 2.5 mg  2.5 mg Oral BID Kalman Shan Ratliff, DO   2.5 mg at 08/24/17 2126  . aspirin chewable tablet 81 mg  81 mg Oral Daily Kalman Shan Ratliff, DO   81 mg at 08/24/17 2841  . buPROPion (WELLBUTRIN SR) 12 hr tablet 150 mg  150 mg Oral BID Kalman Shan Ratliff, DO   150 mg at 08/24/17 2125  . diltiazem (CARDIZEM CD) 24 hr capsule 120 mg  120 mg Oral Daily Kalman Shan Ratliff, DO   120 mg at 08/24/17 1221  . feeding supplement (BOOST / RESOURCE BREEZE) liquid 1 Container  1 Container Oral TID BM Valinda Party, DO   1 Container at 08/24/17 1932  . linaclotide (LINZESS) capsule 145 mcg  145 mcg Oral QAC breakfast Kalman Shan Ratliff, DO   145 mcg at 08/24/17 3244  . metoprolol succinate (TOPROL-XL) 24 hr tablet 50 mg  50 mg Oral Q12H Hoffman, Jessica Ratliff, DO   50 mg at 08/24/17 2125  . nicotine (NICODERM CQ - dosed in mg/24 hours) patch 14 mg  14 mg Transdermal Daily Kalman Shan Ratliff, DO   14 mg at 08/24/17 1201  . ondansetron (ZOFRAN-ODT) disintegrating tablet 4 mg  4 mg Oral Q8H PRN Hoffman, Jessica Ratliff, DO      . oxyCODONE-acetaminophen (PERCOCET/ROXICET) 5-325 MG per tablet 1 tablet  1 tablet Oral Q4H PRN Valinda Party, DO   1 tablet at 08/25/17 0102   And  . oxyCODONE (Oxy IR/ROXICODONE) immediate release tablet 5 mg  5 mg Oral Q4H PRN Kalman Shan Ratliff, DO   5 mg at 08/25/17 0237  . polyethylene glycol (MIRALAX / GLYCOLAX) packet 17 g  17 g Oral BID Kalman Shan Ratliff, DO   17 g at 08/23/17 0825  . rosuvastatin (CRESTOR) tablet 20 mg  20 mg Oral q1800 Kalman Shan Ratliff, DO   20 mg  at 08/24/17 1646  . senna-docusate (Senokot-S) tablet 2 tablet  2 tablet Oral BID Valinda Party, DO   2 tablet at 08/23/17 0825  . tiotropium (SPIRIVA) inhalation capsule 18 mcg  18 mcg Inhalation Daily Kalman Shan Ratliff, DO   18 mcg at 08/25/17 7253     Discharge Medications: Please see discharge summary for a list of discharge medications.  Relevant Imaging Results:  Relevant Lab Results:   Additional Information SSN: [id number]  Estanislado Emms, LCSW

## 2017-08-25 NOTE — Clinical Social Work Note (Addendum)
Clinical Social Work Assessment  Patient Details  Name: BRAYLIN XU MRN: [id number] Date of Birth: 11/09/1945  Date of referral:  08/25/17               Reason for consult:  Facility Placement                Permission sought to share information with:  Facility Art therapist granted to share information::     Name::        Agency::  SNFs  Relationship::     Contact Information:     Housing/Transportation Living arrangements for the past 2 months:  Apartment Source of Information:  Patient Patient Interpreter Needed:  None Criminal Activity/Legal Involvement Pertinent to Current Situation/Hospitalization:  No - Comment as needed Significant Relationships:  Adult Children Lives with:  Self Do you feel safe going back to the place where you live?  Yes Need for family participation in patient care:  No (Coment)  Care giving concerns: Patient from home alone. PT recommending SNF.   Social Worker assessment / plan: CSW met with patient at bedside. Patient alert and oriented. Patient reported he lives at home alone and has been in SNF in the past. Patient receives Meals on Wheels 5 days per week. Patient prefers to go home and is active with home health agency - RNCM aware of patient. Patient had questions about personal aide services. CSW informed patient aide services would be an out of pocket cost. Patient indicated he is only agreeable to short term SNF if it is at [place] or [facility]. CSW sent out initial referrals. Patient has bed offers but not from preferred facilities. [facility] does not have beds available. CSW awaiting call back from [facility] after they review patient's referral. Patient will need pre-authorization from [facility] Medicare before admission to facility. CSW to follow and support with disposition planning.  Employment status:  Retired Research officer, political party) PT Recommendations:  [place] /  Referral to [employer] resources:  [place]  Patient/Family's Response to care:  Patient appreciative of care.  Patient/Family's Understanding of and Emotional Response to Diagnosis, Current Treatment, and Prognosis: Patient with understanding of his condition and SNF recommendation. Patient only agreeable to SNF if it is one of his preferred facilities, otherwise he will decide to go home.  Emotional Assessment Appearance:  Appears stated age Attitude/Demeanor/Rapport:  Engaged Affect (typically observed):  Calm, Pleasant Orientation:  Oriented to Self, Oriented to Place, Oriented to  Time, Oriented to Situation Alcohol / Substance use:  Not Applicable Psych involvement (Current and /or in the community):  No (Comment)  Discharge Needs  Concerns to be addressed:  Discharge Planning Concerns, Care Coordination Readmission within the last 30 days:  Yes Current discharge risk:  Physical Impairment Barriers to Discharge:  Continued Medical Work up   Estanislado Emms, LCSW 08/25/2017, 2:00 PM

## 2017-08-26 ENCOUNTER — Other Ambulatory Visit: Payer: Self-pay | Admitting: *Deleted

## 2017-08-26 ENCOUNTER — Telehealth: Payer: Self-pay

## 2017-08-26 DIAGNOSIS — Z9181 History of falling: Secondary | ICD-10-CM | POA: Diagnosis not present

## 2017-08-26 DIAGNOSIS — I482 Chronic atrial fibrillation: Secondary | ICD-10-CM

## 2017-08-26 DIAGNOSIS — Z9981 Dependence on supplemental oxygen: Secondary | ICD-10-CM | POA: Diagnosis not present

## 2017-08-26 DIAGNOSIS — I481 Persistent atrial fibrillation: Secondary | ICD-10-CM

## 2017-08-26 DIAGNOSIS — N179 Acute kidney failure, unspecified: Secondary | ICD-10-CM | POA: Diagnosis not present

## 2017-08-26 DIAGNOSIS — J449 Chronic obstructive pulmonary disease, unspecified: Secondary | ICD-10-CM | POA: Diagnosis not present

## 2017-08-26 DIAGNOSIS — I5020 Unspecified systolic (congestive) heart failure: Secondary | ICD-10-CM | POA: Diagnosis not present

## 2017-08-26 DIAGNOSIS — Z91040 Latex allergy status: Secondary | ICD-10-CM

## 2017-08-26 DIAGNOSIS — Z91048 Other nonmedicinal substance allergy status: Secondary | ICD-10-CM

## 2017-08-26 DIAGNOSIS — K5909 Other constipation: Secondary | ICD-10-CM | POA: Diagnosis not present

## 2017-08-26 DIAGNOSIS — I9589 Other hypotension: Secondary | ICD-10-CM | POA: Diagnosis not present

## 2017-08-26 DIAGNOSIS — Z888 Allergy status to other drugs, medicaments and biological substances status: Secondary | ICD-10-CM

## 2017-08-26 DIAGNOSIS — R55 Syncope and collapse: Secondary | ICD-10-CM | POA: Diagnosis not present

## 2017-08-26 DIAGNOSIS — I130 Hypertensive heart and chronic kidney disease with heart failure and stage 1 through stage 4 chronic kidney disease, or unspecified chronic kidney disease: Secondary | ICD-10-CM | POA: Diagnosis not present

## 2017-08-26 DIAGNOSIS — Z79891 Long term (current) use of opiate analgesic: Secondary | ICD-10-CM

## 2017-08-26 DIAGNOSIS — Z880 Allergy status to penicillin: Secondary | ICD-10-CM

## 2017-08-26 DIAGNOSIS — Z9114 Patient's other noncompliance with medication regimen: Secondary | ICD-10-CM

## 2017-08-26 MED ORDER — POLYETHYLENE GLYCOL 3350 17 G PO PACK: 17.0000 g | PACK | Freq: Two times a day (BID) | ORAL | 0 refills | Status: AC

## 2017-08-26 MED ORDER — ONDANSETRON 4 MG PO TBDP: 4.0000 mg | ORAL_TABLET | Freq: Three times a day (TID) | ORAL | 0 refills | Status: AC | PRN

## 2017-08-26 MED ORDER — SENNOSIDES-DOCUSATE SODIUM 8.6-50 MG PO TABS: 2.0000 | ORAL_TABLET | Freq: Two times a day (BID) | ORAL | 5 refills | Status: AC

## 2017-08-26 MED ORDER — METOPROLOL SUCCINATE ER 100 MG PO TB24: 100.0000 mg | ORAL_TABLET | Freq: Two times a day (BID) | ORAL | 1 refills | Status: DC

## 2017-08-26 NOTE — Progress Notes (Addendum)
11:34 am [name] rescinded bed offer. CSW gave patient alternate offers and patient declined these offers and indicated he wants to go home. CSW referred to [facility] for home health needs. CSW signing off.  10:36 am [facility] preferred [name] for [facility]. [name] cannot offer a bed but [name] can. CSW confirmed with patient at bedside that patient still agreeable to rehab; patient agreeable to go to rehab at [name]. CSW spoke to admissions at [name] and they will start his [name] authorization this morning. CSW awaiting insurance determination and will support with discharge to SNF today.  [name], [place]

## 2017-08-26 NOTE — Telephone Encounter (Signed)
Hospital TOC per Dr Harden/discharge today or tomorrow. appt 09/02/2017 2:45.

## 2017-08-26 NOTE — Progress Notes (Addendum)
Physical Therapy Treatment Patient Details Name: [name] MRN: [id number] DOB: July 09, 1945 Today's Date: 08/26/2017    History of Present Illness 72 yo M with a past medical history of COPD (on home 3 L home O2 prn), a fib, HFrEF, HTN, CKD III, tachy-brady s/p pacemaker, PVD, depression, chronic pain who presented to the ED with complaints of light-headedness and a fall.     PT Comments    Pt c/o not being able to walk due to weakness but was able to ambulate 5' from bed to chair to eat his lunch.  He was supervision and min guard for safety due to being a fall risk.  Pt has made progress towards his goals as his assist level was only supervision/min guard for all mobility, transfers, and gait this session.  He was +2 only for equipment this treatment but is still c/o dizziness immediately after sitting/standing up.  Pursed lipped breathing helped the dizziness dissipate slightly.  Pt was willing and able to complete all therapeutic exercise only needing VC's for abduction technique. Future sessions should emphasize motivation to walk so he can achieve functional independence.   This pt remains a good candidate for a SNF due to weakness/deconditioning and balance concerns making him a fall risk.  Follow Up Recommendations  SNF     Equipment Recommendations  None recommended by PT    Recommendations for Other Services       Precautions / Restrictions Precautions Precautions: Fall Restrictions Weight Bearing Restrictions: No RLE Weight Bearing: Weight bearing as tolerated LLE Weight Bearing: Weight bearing as tolerated    Mobility  Bed Mobility Overal bed mobility: Needs Assistance Bed Mobility: Supine to Sit     Supine to sit: Supervision     General bed mobility comments: heavy use of bed rail to bring trunk upright, able to scoot EoB without assist, c/o of dizziness once in sitting.  Pursed lip breathing caused the dizziness to dissipate slightly.    Transfers Overall  transfer level: Needs assistance Equipment used: None Transfers: Sit to/from [facility] Sit to Stand: Min guard;+2 safety/equipment Stand pivot transfers: Min guard;Supervision;+2 safety/equipment       General transfer comment: VC's to put hands back on chair when sitting.  c/o of dizziness when sitting up and standing.  Pursed lipped breathing caused the dizziness to dissipate slightly.    Ambulation/Gait Ambulation/Gait assistance: +2 safety/equipment Ambulation Distance (Feet): 5 Feet Assistive device: None Gait Pattern/deviations: Decreased step length - right;Decreased step length - left;Decreased stride length;Shuffle     General Gait Details: Pt c/o not being able to walk but was able to walk 73ft from the chair to the bed with supervision and min guard for safety to eat his food.   Stairs            Wheelchair Mobility    Modified Rankin (Stroke Patients Only)       Balance Overall balance assessment: Needs assistance Sitting-balance support: Bilateral upper extremity supported;Feet unsupported       Standing balance support: No upper extremity supported                                Cognition Arousal/Alertness: Awake/alert Behavior During Therapy: WFL for tasks assessed/performed Overall Cognitive Status: No family/caregiver present to determine baseline cognitive functioning  General Comments: pt alert and oriented, decreased awareness of deficits      Exercises General Exercises - Lower Extremity Ankle Circles/Pumps: AROM;10 reps;Both;Seated Long Arc Quad: AROM;10 reps;Both;Seated Hip ABduction/ADduction: AROM;Both;10 reps;Seated(Demonstration to show proper technique.)    General Comments        Pertinent Vitals/Pain Pain Assessment: Faces Faces Pain Scale: No hurt(Pt. had just recieved pain medication before his treatment.  )    Home Living Family/patient expects  to be discharged to:: Private residence Living Arrangements: Alone Available Help at Discharge: Family;Available PRN/intermittently Type of Home: Apartment Home Access: Elevator   Home Layout: One level Home Equipment: Walker - 4 wheels;Walker - 2 wheels;Cane - single point;Wheelchair - Education administrator (comment);Cane - quad;Shower seat;Grab bars - toilet;Grab bars - tub/shower      Prior Function Level of Independence: Needs assistance  Gait / Transfers Assistance Needed: uses w/c for mobility, has 4 wheeled walker but has been too weak to use in last 3 weeks, transfers bed to w/c, but has experienced falls in process ADL's / Homemaking Assistance Needed: daughter assists with bathing, and iADLs,  Comments: Pt states that he has been spending up to 3 days at a time in bed because he is too weak to get up,   PT Goals (current goals can now be found in the care plan section) Acute Rehab PT Goals Patient Stated Goal: go home PT Goal Formulation: With patient Time For Goal Achievement: 09/07/17 Potential to Achieve Goals: Good Progress towards PT goals: Progressing toward goals    Frequency    Min 2X/week      PT Plan      Co-evaluation              AM-PAC PT "6 Clicks" Daily Activity  Outcome Measure  Difficulty turning over in bed (including adjusting bedclothes, sheets and blankets)?: A Little Difficulty moving from lying on back to sitting on the side of the bed? : A Little Difficulty sitting down on and standing up from a chair with arms (e.g., wheelchair, bedside commode, etc,.)?: A Little Help needed moving to and from a bed to chair (including a wheelchair)?: A Little Help needed walking in hospital room?: A Little Help needed climbing 3-5 steps with a railing? : Total 6 Click Score: 16    End of Session Equipment Utilized During Treatment: Gait belt Activity Tolerance: Patient limited by fatigue;Patient tolerated treatment well;Other (comment) Patient left: in  chair;with call bell/phone within reach;with chair alarm set Nurse Communication: Mobility status PT Visit Diagnosis: Unsteadiness on feet (R26.81);Other abnormalities of gait and mobility (R26.89);Repeated falls (R29.6);Muscle weakness (generalized) (M62.81);Difficulty in walking, not elsewhere classified (R26.2);Dizziness and giddiness (R42)     Time: 2706-2376 PT Time Calculation (min) (ACUTE ONLY): 15 min  Charges:  $Therapeutic Activity: 8-22 mins                    G Codes:       Terri Skains, SPTA [phone]    Terri Skains 08/26/2017, 3:23 PM

## 2017-08-26 NOTE — Discharge Summary (Signed)
Name: [name] MRN: [id number] DOB: 27-May-1945 72 y.o. PCP: Zada Finders, MD  Date of Admission: 08/22/2017 12:11 PM Date of Discharge: 08/26/2017 Attending Physician: Dr. Aldine Contes   Discharge Diagnosis: Active Problems:   Atrial fibrillation with RVR [facility])   Discharge Medications: Allergies as of 08/26/2017      Reactions   Amiodarone Other (See Comments)   Thyroiditis in the setting of amiodarone use   Tape Itching   Reaction to plastic tape - please use paper tape   Latex Rash   Penicillins Rash, Other (See Comments)   Has patient had a PCN reaction causing immediate rash, facial/tongue/throat swelling, SOB or lightheadedness with hypotension: Yes Has patient had a PCN reaction causing severe rash involving mucus membranes or skin necrosis: No Has patient had a PCN reaction that required hospitalization No Has patient had a PCN reaction occurring within the last 10 years: No If all of the above answers are "NO", then may proceed with Cephalosporin use.      Medication List    TAKE these medications   albuterol 108 (90 Base) MCG/ACT inhaler Commonly known as:  VENTOLIN HFA Inhale 1-2 puffs into the lungs every 6 (six) hours as needed for wheezing or shortness of breath.   apixaban 2.5 MG Tabs tablet Commonly known as:  ELIQUIS Take 1 tablet (2.5 mg total) by mouth 2 (two) times daily.   aspirin 81 MG chewable tablet Chew 1 tablet (81 mg total) by mouth daily.   buPROPion 150 MG 12 hr tablet Commonly known as:  WELLBUTRIN SR Take 1 tablet (150 mg total) by mouth 2 (two) times daily.   diltiazem 120 MG 24 hr capsule Commonly known as:  CARDIZEM CD Take 1 capsule (120 mg total) by mouth daily.   dronabinol 10 MG capsule Commonly known as:  MARINOL Take 10 mg by mouth 2 (two) times daily before a meal.   ENSURE Take 237 mLs by mouth 2 (two) times daily.   linaclotide 145 MCG Caps capsule Commonly known as:  LINZESS Take 1 capsule (145 mcg total) by  mouth daily before breakfast.   metoprolol succinate 100 MG 24 hr tablet Commonly known as:  TOPROL-XL Take 1 tablet (100 mg total) by mouth every 12 (twelve) hours. Take with or immediately following a meal. What changed:    medication strength  how much to take   ondansetron 4 MG disintegrating tablet Commonly known as:  ZOFRAN-ODT Take 1 tablet (4 mg total) by mouth every 8 (eight) hours as needed for nausea or vomiting.   polyethylene glycol packet Commonly known as:  MIRALAX / GLYCOLAX Take 17 g by mouth 2 (two) times daily. What changed:    when to take this  reasons to take this   rosuvastatin 20 MG tablet Commonly known as:  CRESTOR TAKE 1 TABLET EVERY DAY AT 6 PM What changed:    how much to take  how to take this  when to take this  additional instructions   senna-docusate 8.6-50 MG tablet Commonly known as:  Senokot-S Take 2 tablets by mouth 2 (two) times daily.   tiotropium 18 MCG inhalation capsule Commonly known as:  SPIRIVA Place 1 capsule (18 mcg total) into inhaler and inhale daily.       Disposition and follow-up:   [name] was discharged from [facility] in Stable condition.  At the hospital follow up visit please address:  1.  -Ensure adherence to metoprolol and diltiazem  to avoid recurrent rapid ventricular response with chronic, persistent a fib. Metoprolol increased to 100 mg BID during admission  -Assess BP with medication changes. Could consider fludrocortisone or midodrine if complaining of orthostatic sx (which are likely exacerbated by poor PO intake), though have to balance risk of fluid/salt retention with his prior HF hx  -Encourage PO intake to maintain hydration and prevention dehydration -Continue bowel regimen and increase as needed to maintain bowel movements and avoid constipation or impaction  2.  Labs / imaging needed at time of follow-up: None  3.  Pending labs/ test needing follow-up: None    Follow-up Appointments: [place] Follow up.   Why:  [facility] Contact information: [place] [address] [place], [facility] Follow up.   Specialty:  Home Health Services Why:  Registered Nurse, Physical Therapy, Aide, Education officer, museum.  Contact information: [address] [place] [address] [phone]           Hospital Course by problem list:   Atrial Fibrillation with RVR Pt found to be in a fib with RVR on admission in the setting of dehydration and medication non-adherence (missed doses for ~24 hrs at time of presentation) and was initially treated with diltiazem drip. Home metoprolol was resumed and diltiazem drip weaned and discontinued (did require re-increase in dose during weaning for brief period) with improved rate control. As his blood pressure also improved with hydration, his home diltiazem was also added. His rate remained borderline around 100s-110s and metoprolol increased to 75 mg and subsequently 100 mg on discharge. (Note, 75 mg home dose would've required more complex dosing based on available strength pills. Simplified increase to 100 mg preferred).   Near Syncope, Fall, Chronic Hypotension  Complained of near syncope sx ongoing for several weeks but that led to fall on day of admission, head CT and C spine negative for acute injuries. Orthostatic vital signs were positive. His near syncope may be multi-factorial as he has chronic hypotension at baseline, poor oral intake, as well as atrial fibrillation with RVR on admission. His sx improved with improved rate control and hydration, BP stable in 110s prior to discharge. PT recommended SNF, pt initially considered this option but ultimately declined as he did not receive a bed offer from his preferred facilities. If he continues to be symptomatic with soft blood pressures, could consider  fludrocortisone or midodrine addition on follow up though this could lead to fluid retention that can be problematic in heart failure.   Acute on Chronic Kidney Injury (CKD III) Creatinine on presentation elevated to 1.83, baseline appears to be 1.2-1.4.  This was likely due to decreased p.o. intake which has been a chronic issue and difficult to manage.  His creatinine improved with IV hydration and was consistent with baseline at 1.3 at time of discharge.   H/o Constipation, Impaction Patient has chronic issues with constipation and impaction.  ED exam showed soft stool in rectal vault without impaction.  He briefly had an episode of nausea with emesis which quickly resolved.  Had multiple bowel movements during admission with resumption of bowel regimen (patient had been out for about 2 days prior to presentation).  Discharged with refills for bowel regimen and short supply of zofran.  H/o COPD on home O2 as needed No respiratory symptoms or complaints during admission, stated his breathing was at his baseline. He  is on home O2 as needed. He did have periods on room air at rest with oxygen level within goal.     H/o HFrEF (EF 30% on last echo, 07/2015) Given this history, volume status monitored closely with fluid repletion which he tolerated well, no volume or cardiac issues this admission. Could consider repeat echo as outpatient for update regarding cardiac function.    Discharge Vitals:   BP 126/83 (BP Location: Right Arm)   Pulse 89   Temp 98.3 F (36.8 C) (Oral)   Resp 16   Ht 6\' 3"  (1.905 m)   Wt 144 lb 2.9 oz (65.4 kg)   SpO2 100%   BMI 18.02 kg/m   Pertinent Labs, Studies, and Procedures:  BMP Latest Ref Rng & Units 08/23/2017 08/22/2017 07/27/2017  Glucose 65 - 99 mg/dL 78 83 98  BUN 6 - 20 mg/dL 26(H) 36(H) 31(H)  Creatinine 0.61 - 1.24 mg/dL 1.30(H) 1.83(H) 1.30(H)  BUN/Creat Ratio 10 - 22 - - -  Sodium 135 - 145 mmol/L 142 142 141  Potassium 3.5 - 5.1 mmol/L 4.1 4.3 5.1   Chloride 101 - 111 mmol/L 112(H) 111 111  CO2 22 - 32 mmol/L 22 20(L) 21(L)  Calcium 8.9 - 10.3 mg/dL 8.3(L) 8.8(L) 8.6(L)     Discharge Instructions: Discharge Instructions    Diet - low sodium heart healthy   Complete by:  As directed    Discharge instructions   Complete by:  As directed    Good to see you Mr. Hands -Edwena Bunde made sure your orders for home health and a request for a hospital bed are in.  -For your metoprolol, start taking 100 mg twice a day. You previously took 50 mg twice a day. I sent a prescription for the 100 mg tablets to your pharmacy -I also sent a prescription for a small supply of the nausea medication  -We are going to make you a follow up appointment in clinic for next week to see how you are doing with your heart rate, blood pressure, and light-headedness -Continue taking your miralax twice a day and also the senokot twice a day. I resent these to the pharmacy to make sure you have them   Increase activity slowly   Complete by:  As directed       Signed: Tawny Asal, MD 08/28/2017, 3:50 PM   Pager: [phone]

## 2017-08-26 NOTE — Care Management (Signed)
    Durable Medical Equipment  (From admission, onward)        Start     Ordered   08/26/17 1153  For home use only DME Hospital bed  Once    Question Answer Comment  Patient has (list medical condition): COPD, a fib with rvr, osteoarthritis, cachexia   Bed type Semi-electric      08/26/17 1153

## 2017-08-26 NOTE — Progress Notes (Signed)
   Subjective: No acute events overnight, tele reviewed with improved rate control, BP has remained stable. Pt originally had bed offer from preferred SNF, however offer was rescinded and pt would like to go home rather than an alternative facility.   Objective:  Vital signs in last 24 hours: Vitals:   08/25/17 1650 08/25/17 2033 08/26/17 0019 08/26/17 0511  BP: 114/82 109/76 103/82 (!) 134/96  Pulse: 84 71 85 67  Resp: 15 17  15   Temp: 98.4 F (36.9 C) 98 F (36.7 C) 98.3 F (36.8 C) 97.8 F (36.6 C)  TempSrc: Oral Oral Oral Oral  SpO2: 98% 100% 93% 100%  Weight:    144 lb 2.9 oz (65.4 kg)  Height:       Stable exam General: Frail elderly gentleman resting in bed comfortably, no acute distress CV: Irregular rhythm, normal rate  Resp: Stable breath sounds without wheezing, normal work of breathing, no distress  Abd: Soft, +BS, mild lower abdominal tenderness, stable from prior Extr: No LE edema, low muscle mass  Neuro: Alert and oriented x3  Skin: Warm, dry    Assessment/Plan:  Atrial Fibrillation On admission patient was found to be in afib with RVR and was initially treated with diltiazem drip and home metoprolol and rate control achieved.  Home diltiazem was being held for low BP. Home medications have been restarted with stable BP, no recurrent RVR overnight. Rates borderline yesterday pm and metoprolol dose increased to 75 with improved HR, stable BP.   --Tele, monitor vital signs --Cont Metoprolol 75 mg BID and home diltiazem --Cont home Eliquis   Near Syncope, Fall, H/o Chronic Hypotension Presented with several week history of lightheadedness with positional changes, likely due to poor p.o. intake, chronic hypotension, atrial fibrillation.  Head CT negative. Orthostatics positive. He has received IV fluids with improvement in blood pressures. --PT or ambulate with nurse  Acute on Chronic Kidney Injury (CKD III) Creatinine on presentation elevated at 1.83, baseline  appears to be 1.2-1.4.  Likely due to decreased p.o. intake which has been a chronic issue.  Creatinine has improved with initial fluids and currently consistent with baseline.  H/o Constipation, Impaction Chronic issues with constipation.  No impaction on admission.  Had bowel movements yesterday with one episode of emesis.  --Miralax BID, senna-docusate BID --Cont home Linaclotide  --Zofran prn    Dispo: Anticipated discharge 0-1 days.   Tawny Asal, MD 08/26/2017, 7:04 AM Pager: [phone]

## 2017-08-26 NOTE — Telephone Encounter (Signed)
Would like to speak with [name].  

## 2017-08-26 NOTE — Progress Notes (Signed)
Internal Medicine Attending:   I saw and examined the patient. I reviewed the resident's note and I agree with the resident's findings and plan as documented in the resident's note.  Patient feels better today with no new complaints.  Patient was initially admitted with worsening lightheadedness and a mechanical fall.  He was found to be in A. fib with RVR and had acute on chronic kidney injury likely prerenal secondary to decreased oral intake.  Patient improved with IV hydration as well as titration of his oral medication for his A. fib.  We will continue with home dose of diltiazem as well as increased dose of metoprolol (75 mg twice daily).  Patient stable for discharge home today.  He was initially scheduled to go to SNF but the facility recently rescinded their offer and patient would prefer to go home rather than go to another facility.

## 2017-08-27 MED ORDER — OXYCODONE-ACETAMINOPHEN 10-325 MG PO TABS: 1.0000 | ORAL_TABLET | ORAL | 0 refills | Status: DC | PRN

## 2017-08-27 NOTE — Telephone Encounter (Signed)
Call from pt - informed Oxycodone has been refilled.

## 2017-08-27 NOTE — Telephone Encounter (Signed)
Spoke w/ pt 4/9, sent request

## 2017-08-27 NOTE — Telephone Encounter (Signed)
Pt is calling to see if oxycodone is ready. Please call pt back.

## 2017-08-27 NOTE — Telephone Encounter (Signed)
Called pt about refill - no answer.

## 2017-08-29 NOTE — Telephone Encounter (Signed)
Transition Care Management Follow-up Telephone Call   Date discharged? Tuesday 08/26/17   How have you been since you were released from the hospital? "Doing pretty good"   Do you understand why you were in the hospital? Yes "fast heart rate and low blood pressure".   Do you understand the discharge instructions? Yes  Where were you discharged to? Home  Items Reviewed:  Medications reviewed: Yes  Allergies reviewed: Yes  Dietary changes reviewed: "On heart  Healthy diet".  Referrals reviewed:  [facility] . He has a hospital bed.   Functional Questionnaire:   Activities of Daily Living (ADLs):   He states they are independent in the following: states he needs assistance with preparing meals; has Meals on Wheels and a neighbor comes by to "check on me" (helps).   Any transportation issues/concerns?: Stated gave his car to his daughter; who drives for him.   Any patient concerns? Stated [facility] suppose to come; received call that someone will come @0900  AM -told her, too early , come in the afternoon but has not seen anyone yet.   Confirmed importance and date/time of follow-up visits scheduled Yes  Provider Appointment booked with [facility] on 4/16.  Confirmed with patient if condition begins to worsen call PCP or go to the ER.  Patient was given the office number and encouraged to call back with question or concerns.  : yes

## 2017-09-02 ENCOUNTER — Ambulatory Visit: Payer: Medicare Other

## 2017-09-02 ENCOUNTER — Encounter (HOSPITAL_COMMUNITY): Payer: Self-pay

## 2017-09-02 NOTE — Progress Notes (Signed)
Spoke with patient regarding his current health status and pulmonary rehab referral.Patient states he is currently wheelchair bound in his home. Described pulmonary rehab program in detail to him. Patient and RN agree that patient is physically unable to participate in the program at this time. RN enquired about his [facility]. Patient states "they called and wanted to come the other day at 9am but that was too early for me and I haven't heard from them since. Patient encouraged to reach out to [facility] as this may be the only way patient will be able to meet physical goals to enroll in pulmonary rehab. Referral will be closed for now.

## 2017-09-03 ENCOUNTER — Encounter: Payer: Self-pay | Admitting: Internal Medicine

## 2017-09-03 ENCOUNTER — Ambulatory Visit: Payer: Medicare Other

## 2017-09-03 ENCOUNTER — Telehealth: Payer: Self-pay

## 2017-09-03 NOTE — Telephone Encounter (Signed)
[name] with [facility] requesting VO for Education officer, museum. Please call back.

## 2017-09-03 NOTE — Telephone Encounter (Signed)
rtc to alice, VO given for [facility] CSW , do you agree?

## 2017-09-03 NOTE — Telephone Encounter (Signed)
Agree, thank you

## 2017-09-12 ENCOUNTER — Telehealth: Payer: Self-pay | Admitting: Internal Medicine

## 2017-09-12 NOTE — Telephone Encounter (Signed)
Donita from [facility] said they are discharge him for non compliance

## 2017-09-15 ENCOUNTER — Other Ambulatory Visit: Payer: Self-pay

## 2017-09-15 MED ORDER — DRONABINOL 10 MG PO CAPS: 10.0000 mg | ORAL_CAPSULE | Freq: Two times a day (BID) | ORAL | 1 refills | Status: DC

## 2017-09-15 MED ORDER — BUPROPION HCL ER (SR) 150 MG PO TB12
150.0000 mg | ORAL_TABLET | Freq: Two times a day (BID) | ORAL | 0 refills | Status: AC
Start: 2017-09-15 — End: ?

## 2017-09-15 NOTE — Telephone Encounter (Signed)
buPROPion (WELLBUTRIN SR) 150 MG 12 hr tablet, refill request. Pt states he is out of this med since Friday. Requesting [name] to call back.

## 2017-09-15 NOTE — Telephone Encounter (Signed)
Working on [employer]

## 2017-09-17 NOTE — Telephone Encounter (Signed)
Called pt - stated he received Wellbutrin but Marinol needs a prior authorization. I will forward to [place].

## 2017-09-17 NOTE — Telephone Encounter (Signed)
Patient is calling back regarding medicine, pls contact patient

## 2017-09-23 ENCOUNTER — Telehealth: Payer: Self-pay | Admitting: Internal Medicine

## 2017-09-23 ENCOUNTER — Other Ambulatory Visit: Payer: Self-pay | Admitting: *Deleted

## 2017-09-23 DIAGNOSIS — Z79891 Long term (current) use of opiate analgesic: Secondary | ICD-10-CM

## 2017-09-23 MED ORDER — OXYCODONE-ACETAMINOPHEN 10-325 MG PO TABS: 1.0000 | ORAL_TABLET | ORAL | 0 refills | Status: DC | PRN

## 2017-09-23 NOTE — Telephone Encounter (Signed)
Patient calling checking on medications, pls call patient back

## 2017-09-23 NOTE — Telephone Encounter (Signed)
Closed by mistake, will resend

## 2017-09-24 NOTE — Telephone Encounter (Signed)
PT no showed appt for HFU on  09/02/2017.

## 2017-10-15 ENCOUNTER — Other Ambulatory Visit: Payer: Self-pay | Admitting: Pharmacist

## 2017-10-15 DIAGNOSIS — J441 Chronic obstructive pulmonary disease with (acute) exacerbation: Secondary | ICD-10-CM

## 2017-10-15 DIAGNOSIS — I700 Atherosclerosis of aorta: Secondary | ICD-10-CM

## 2017-10-15 MED ORDER — ROSUVASTATIN CALCIUM 20 MG PO TABS: 20.0000 mg | ORAL_TABLET | Freq: Every day | ORAL | 11 refills | Status: AC

## 2017-10-15 MED ORDER — TIOTROPIUM BROMIDE MONOHYDRATE 18 MCG IN CAPS: 18.0000 ug | ORAL_CAPSULE | Freq: Every day | RESPIRATORY_TRACT | 11 refills | Status: AC

## 2017-10-15 NOTE — Progress Notes (Signed)
Called patient to follow up on help with medications. He states he is doing well overall and requested refills on rosuvastatin and tiotropium. He states he only uses tiotropium as needed. He reports using albuterol 2-3 times weekly. Counseled patient on daily use of tiotropium and goal to reduce need for PRN albuterol. Patient verbalized understanding by repeat back.

## 2017-10-21 ENCOUNTER — Telehealth: Payer: Self-pay | Admitting: *Deleted

## 2017-10-21 ENCOUNTER — Other Ambulatory Visit: Payer: Self-pay

## 2017-10-21 DIAGNOSIS — Z79891 Long term (current) use of opiate analgesic: Secondary | ICD-10-CM

## 2017-10-21 NOTE — Telephone Encounter (Signed)
Call to [facility] for PA for Marinol 10 mg Capsules # 60.  Spoke with representative.  Approved 10/21/2017 thru 01/21/2018 . PA # [id number].  Approval was called to [place].  Call to patient to inform of approval of medication.  Sander Nephew, RN 10/21/2017 5:00 PM

## 2017-10-21 NOTE — Telephone Encounter (Signed)
oxyCODONE-acetaminophen (PERCOCET) 10-325 MG tablet   Refill request @ [facility] on [address].

## 2017-10-22 MED ORDER — OXYCODONE-ACETAMINOPHEN 10-325 MG PO TABS: 1.0000 | ORAL_TABLET | ORAL | 0 refills | Status: DC | PRN

## 2017-10-25 IMAGING — US US RENAL
1 series · 14 of 25 positions shown · non-contrast
Comparison: Renal ultrasound November 13, 2014

CLINICAL DATA: Acute and chronic renal injury ;

EXAM:
RENAL / URINARY TRACT ULTRASOUND COMPLETE

[Series 1: us renal · 0.20mm/px · 14 of 30 slices shown]
[im 1/30]
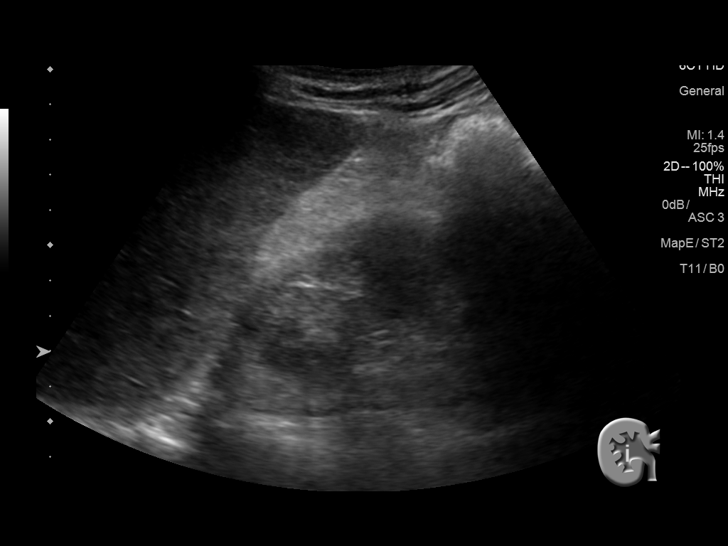
[im 3/30]
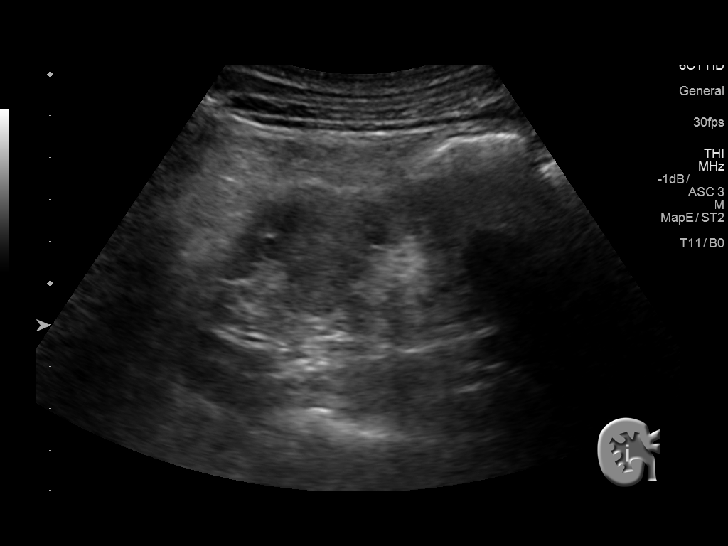
[im 5/30]
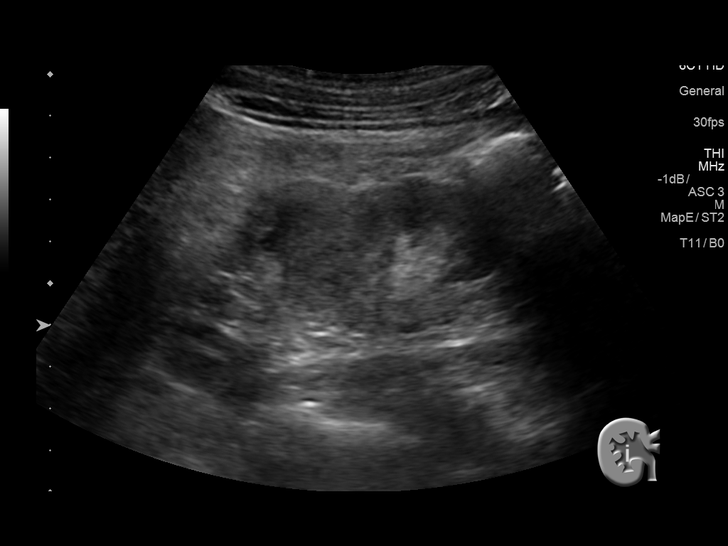
[im 8/30]
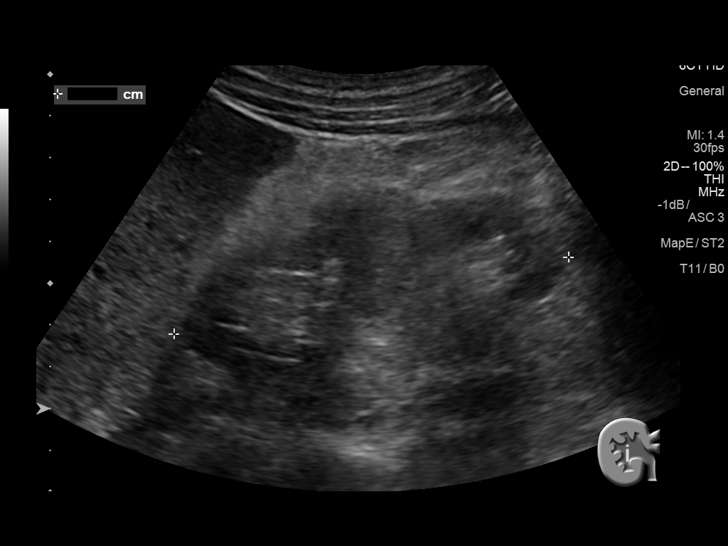
[im 10/30]
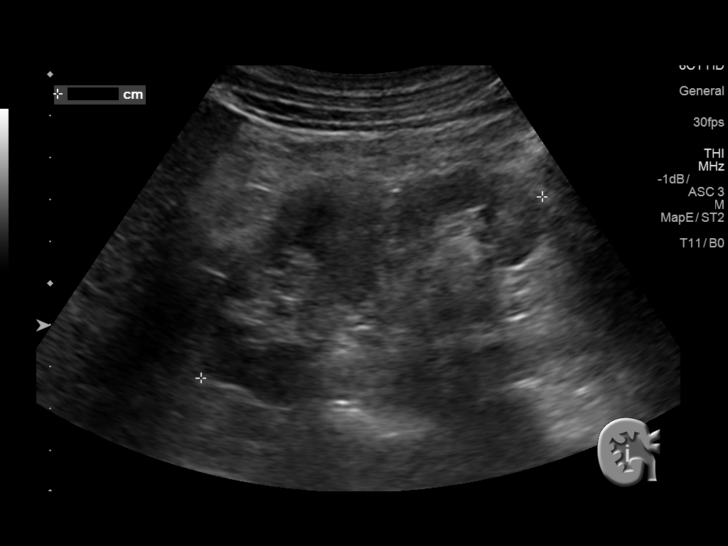
[im 11/30]
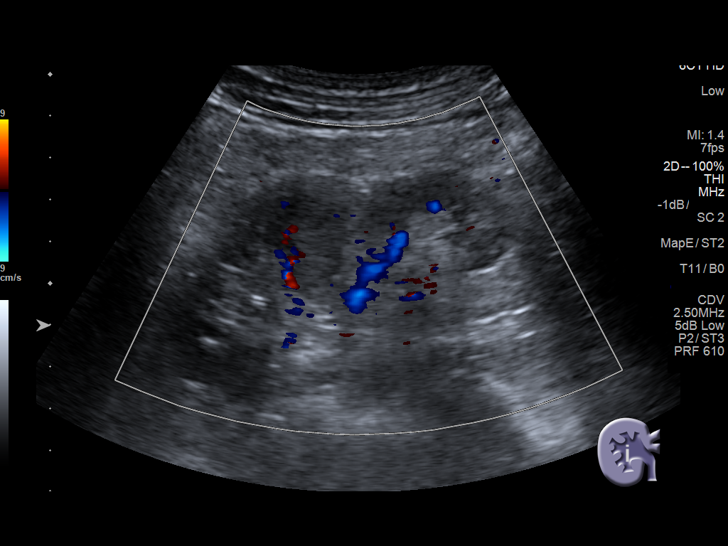
[im 14/30]
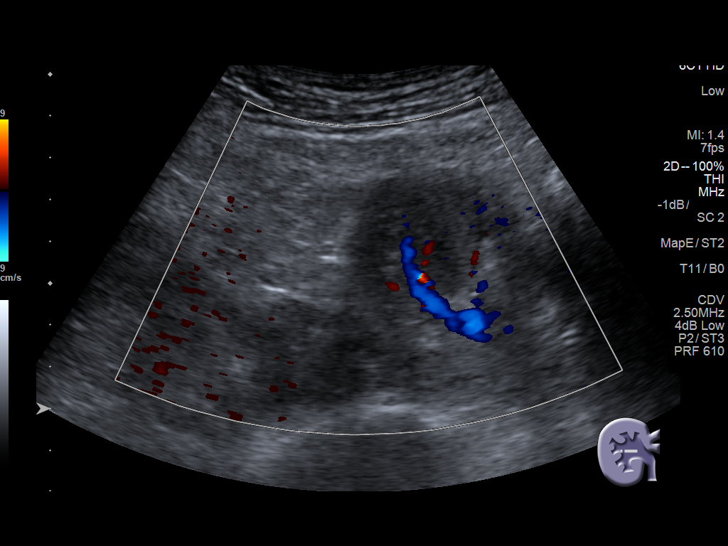
[im 16/30]
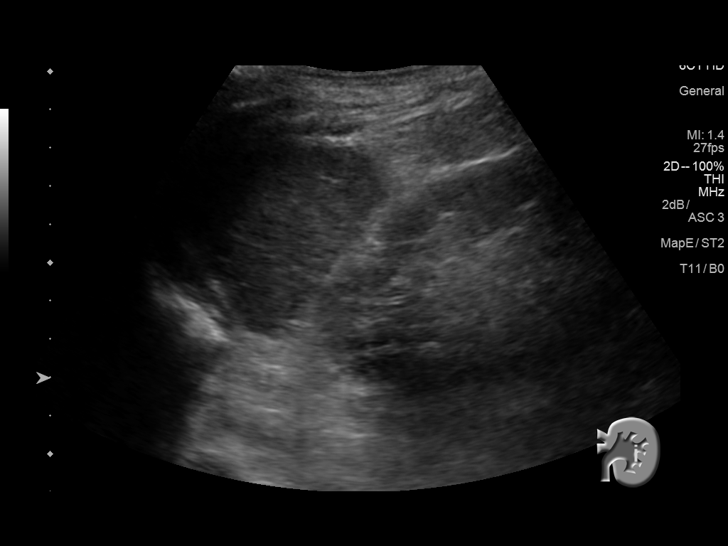
[im 19/30]
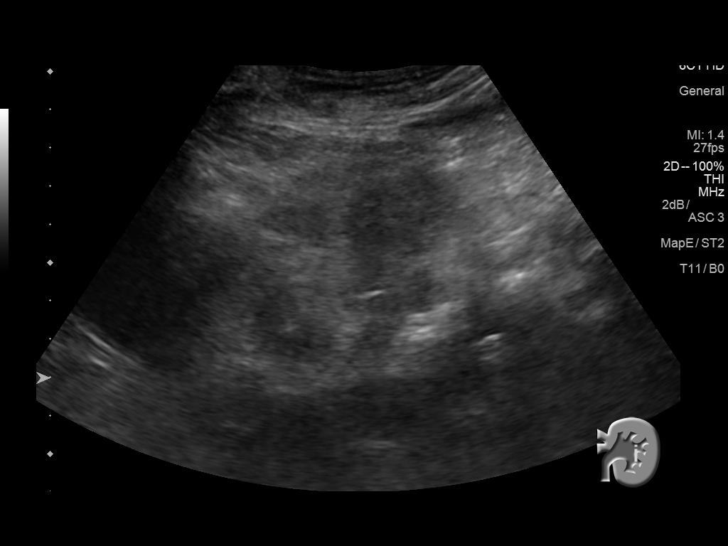
[im 20/30]
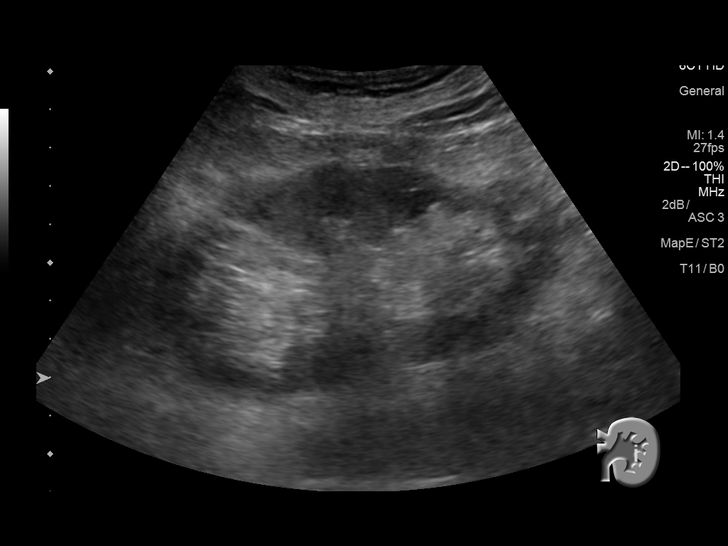
[im 22/30]
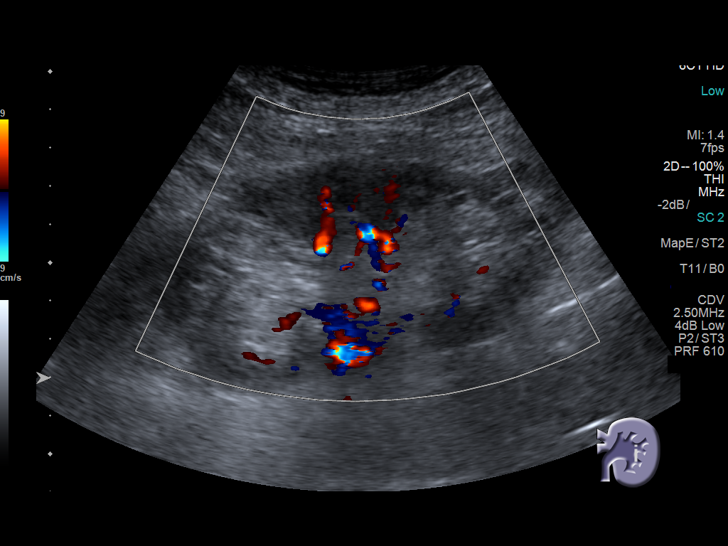
[im 25/30]
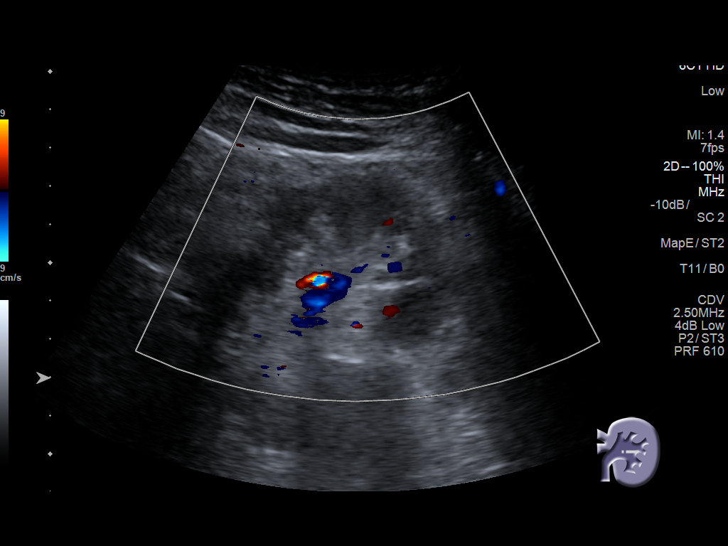
[im 27/30]
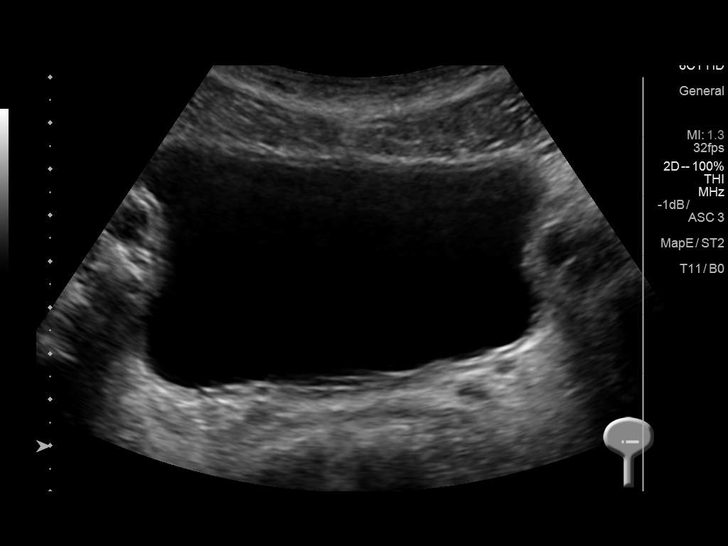
[im 30/30]
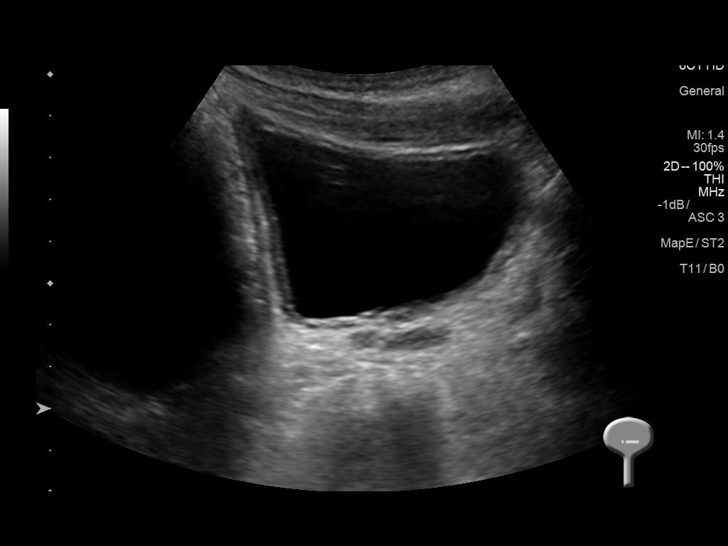

[14 of 25 positions shown; findings below may reference images not displayed]

FINDINGS: Right Kidney:

Length: 9.6 cm. The renal cortical echotexture remains slightly
lower than that of the adjacent liver. There is no focal mass nor
hydronephrosis.

Left Kidney:

Length: 10.9 cm. The renal cortical echotexture is similar to that
on the right. There is no focal mass or hydronephrosis.

Bladder:

Appears normal for degree of bladder distention.
IMPRESSION: Slight overall increase in the renal cortical echotexture consistent
with medical renal disease. There is no parenchymal mass nor
evidence of obstruction.

## 2017-11-04 ENCOUNTER — Other Ambulatory Visit: Payer: Self-pay | Admitting: Internal Medicine

## 2017-11-04 MED ORDER — METOPROLOL SUCCINATE ER 100 MG PO TB24: 100.0000 mg | ORAL_TABLET | Freq: Two times a day (BID) | ORAL | 1 refills | Status: DC

## 2017-11-12 ENCOUNTER — Encounter: Payer: Medicare Other | Admitting: Internal Medicine

## 2017-11-18 ENCOUNTER — Encounter: Payer: Self-pay | Admitting: Internal Medicine

## 2017-11-18 ENCOUNTER — Ambulatory Visit (HOSPITAL_COMMUNITY)
Admission: RE | Admit: 2017-11-18 | Discharge: 2017-11-18 | Disposition: A | Discharge status: Home / Self Care | Payer: Medicare Other | Source: Ambulatory Visit | Attending: Internal Medicine | Admitting: Internal Medicine

## 2017-11-18 ENCOUNTER — Ambulatory Visit (INDEPENDENT_AMBULATORY_CARE_PROVIDER_SITE_OTHER): Payer: Medicare Other | Admitting: Internal Medicine

## 2017-11-18 VITALS — BP 114/80 | HR 62 | Temp 97.9°F | Wt 132.9 lb

## 2017-11-18 DIAGNOSIS — M25552 Pain in left hip: Secondary | ICD-10-CM | POA: Diagnosis not present

## 2017-11-18 DIAGNOSIS — Z993 Dependence on wheelchair: Secondary | ICD-10-CM | POA: Diagnosis not present

## 2017-11-18 DIAGNOSIS — R300 Dysuria: Secondary | ICD-10-CM | POA: Diagnosis not present

## 2017-11-18 DIAGNOSIS — K5903 Drug induced constipation: Secondary | ICD-10-CM

## 2017-11-18 DIAGNOSIS — J941 Fibrothorax: Secondary | ICD-10-CM | POA: Diagnosis not present

## 2017-11-18 DIAGNOSIS — Z79899 Other long term (current) drug therapy: Secondary | ICD-10-CM

## 2017-11-18 DIAGNOSIS — Z79891 Long term (current) use of opiate analgesic: Secondary | ICD-10-CM

## 2017-11-18 DIAGNOSIS — K5900 Constipation, unspecified: Secondary | ICD-10-CM

## 2017-11-18 DIAGNOSIS — Z77090 Contact with and (suspected) exposure to asbestos: Secondary | ICD-10-CM

## 2017-11-18 DIAGNOSIS — I480 Paroxysmal atrial fibrillation: Secondary | ICD-10-CM

## 2017-11-18 DIAGNOSIS — R05 Cough: Secondary | ICD-10-CM | POA: Diagnosis not present

## 2017-11-18 DIAGNOSIS — M25551 Pain in right hip: Secondary | ICD-10-CM | POA: Diagnosis not present

## 2017-11-18 DIAGNOSIS — J449 Chronic obstructive pulmonary disease, unspecified: Secondary | ICD-10-CM | POA: Diagnosis not present

## 2017-11-18 DIAGNOSIS — Z720 Tobacco use: Secondary | ICD-10-CM | POA: Diagnosis not present

## 2017-11-18 DIAGNOSIS — R918 Other nonspecific abnormal finding of lung field: Secondary | ICD-10-CM | POA: Insufficient documentation

## 2017-11-18 DIAGNOSIS — Z8719 Personal history of other diseases of the digestive system: Secondary | ICD-10-CM

## 2017-11-18 DIAGNOSIS — J984 Other disorders of lung: Secondary | ICD-10-CM | POA: Insufficient documentation

## 2017-11-18 DIAGNOSIS — Z87891 Personal history of nicotine dependence: Secondary | ICD-10-CM

## 2017-11-18 DIAGNOSIS — R059 Cough, unspecified: Secondary | ICD-10-CM

## 2017-11-18 DIAGNOSIS — G8929 Other chronic pain: Secondary | ICD-10-CM

## 2017-11-18 DIAGNOSIS — I739 Peripheral vascular disease, unspecified: Secondary | ICD-10-CM

## 2017-11-18 DIAGNOSIS — Z8249 Family history of ischemic heart disease and other diseases of the circulatory system: Secondary | ICD-10-CM

## 2017-11-18 DIAGNOSIS — Z9181 History of falling: Secondary | ICD-10-CM

## 2017-11-18 LAB — CBC WITH DIFFERENTIAL/PLATELET
Abs Immature Granulocytes: 0 10*3/uL (ref 0.0–0.1)
BASOS PCT: 1 %
Basophils Absolute: 0.1 10*3/uL (ref 0.0–0.1)
EOS ABS: 0.5 10*3/uL (ref 0.0–0.7)
EOS PCT: 5 %
HEMATOCRIT: 49 % (ref 39.0–52.0)
Hemoglobin: 15.4 g/dL (ref 13.0–17.0)
IMMATURE GRANULOCYTES: 0 %
Lymphocytes Relative: 28 %
Lymphs Abs: 2.4 10*3/uL (ref 0.7–4.0)
MCH: 30.1 pg (ref 26.0–34.0)
MCHC: 31.4 g/dL (ref 30.0–36.0)
MCV: 95.9 fL (ref 78.0–100.0)
Monocytes Absolute: 0.7 10*3/uL (ref 0.1–1.0)
Monocytes Relative: 8 %
NEUTROS PCT: 58 %
Neutro Abs: 4.9 10*3/uL (ref 1.7–7.7)
PLATELETS: 228 10*3/uL (ref 150–400)
RBC: 5.11 MIL/uL (ref 4.22–5.81)
RDW: 13.8 % (ref 11.5–15.5)
WBC: 8.6 10*3/uL (ref 4.0–10.5)

## 2017-11-18 LAB — RENAL FUNCTION PANEL
ALBUMIN: 3.9 g/dL (ref 3.5–5.0)
Anion gap: 10 (ref 5–15)
BUN: 16 mg/dL (ref 8–23)
CHLORIDE: 110 mmol/L (ref 98–111)
CO2: 24 mmol/L (ref 22–32)
CREATININE: 1.35 mg/dL — AB (ref 0.61–1.24)
Calcium: 9.5 mg/dL (ref 8.9–10.3)
GFR calc Af Amer: 59 mL/min — ABNORMAL LOW (ref >60)
GFR, EST NON AFRICAN AMERICAN: 50 mL/min — AB (ref >60)
GLUCOSE: 79 mg/dL (ref 70–99)
PHOSPHORUS: 3.5 mg/dL (ref 2.5–4.6)
POTASSIUM: 4.3 mmol/L (ref 3.5–5.1)
Sodium: 144 mmol/L (ref 135–145)

## 2017-11-18 LAB — POCT URINALYSIS DIPSTICK
Glucose, UA: NEGATIVE
NITRITE UA: POSITIVE
Protein, UA: POSITIVE — AB
Urobilinogen, UA: 2 E.U./dL — AB
pH, UA: 6 (ref 5.0–8.0)

## 2017-11-18 MED ORDER — SULFAMETHOXAZOLE-TRIMETHOPRIM 800-160 MG PO TABS: 1.0000 | ORAL_TABLET | Freq: Two times a day (BID) | ORAL | 0 refills | Status: AC

## 2017-11-18 MED ORDER — OXYCODONE-ACETAMINOPHEN 10-325 MG PO TABS: 1.0000 | ORAL_TABLET | ORAL | 0 refills | Status: DC | PRN

## 2017-11-18 NOTE — Assessment & Plan Note (Signed)
Pt has been cutting back smoking.  He is down to one pack every 3 days this is a decrease from 1 and 1/2 ppd.  He is on buproprion.  I asked if there is anything else I could do to help such as nicotene patches, gum or lozenges.  He declined and wishes to continue to cut back on his own.

## 2017-11-18 NOTE — Assessment & Plan Note (Signed)
Patient reports cough with phlegm about 2-3 weeks, no fevers or chills.  He had some rhonci on exam that partially cleared with coughing.  He denies having a cough before this although he does have COPD and a history of asbestos exposure.    -will order 2 view chest x-ray to further evaluate

## 2017-11-18 NOTE — Assessment & Plan Note (Signed)
Pt reports good compliance with current rate control medicine, is no longer on amiodarone.  His heart rate is well controlled today, he is in afib on exam.  He reports he was supposed to follow up with Dr. Rayann Heman but never has.    -continue current rate control regimen on med list -pt states that he will call Dr. Jackalyn [place] office to set up a follow up appointment and reestablish

## 2017-11-18 NOTE — Assessment & Plan Note (Addendum)
Pt has severe constipation mostly driven by high doses of opioids.  He is currently taking linzess 169mcg daily and uses senokot PRN.  He reports a soft bowel movement about every 3 days he does not feel constipated at this time he feels this is due to poor oral intake.    -continue current bowel regiment

## 2017-11-18 NOTE — Progress Notes (Signed)
Internal Medicine Clinic Attending  Case discussed with Dr. Winfrey  at the time of the visit.  We reviewed the resident's history and exam and pertinent patient test results.  I agree with the assessment, diagnosis, and plan of care documented in the resident's note.  

## 2017-11-18 NOTE — Progress Notes (Signed)
CC: hospital follow up, PAF, Dysuria, Cough, PVD  HPI:  [name] is a 72 y.o. male with PMH below.  He is here for hospital follow up and to address new cough and dysuria, PAF and his continued worsening peripheral vascular disease.  Please see A&P for status of the patient's chronic medical conditions  Past Medical History:  Diagnosis Date  . Alcohol abuse   . Anxiety   . Asbestos exposure   . Atrial fibrillation with RVR ([place]) 01/17/2014  . Avascular necrosis of bones of both hips (HCC)    Status post bilateral hip replacements  . Bipolar disorder ([place])   . Chronic systolic heart failure ([place])    a. NICM - EF 35% with normal cors 2003. b. Last echo 11/2012 - EF 25-30%.  . CKD (chronic kidney disease), stage III ([place])    Renal U/S 12/04/2009 showed no pathological findings. Labs 12/04/2009 include normal ESR, C3, C4; neg ANA; SPEP showed nonspecific increase in the alpha-2 region with no M-spike; UPEP showed no monoclonal free light chains; urine IFE showed polyclonal increase in feree Kappa and/or free Lambda light chains. Baseline Cr reported 1.7-2.5.  [name] COPD (chronic obstructive pulmonary disease) ([place])   . Depression   . DVT (deep venous thrombosis) ([place])    He has hypercoagulability with multiple prior DVTs  . E coli bacteremia   . Erectile dysfunction   . Failure to thrive (0-17) 08/2016   DEHYDRATION  . Gastritis   . GERD (gastroesophageal reflux disease)   . HCAP (healthcare-associated pneumonia) 08/24/2015  . Hydrocele, unspecified   . Hyperlipemia   . Hypertension   . 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  Thyroid scan 08/28/2009 was normal with no focal areas of abnormal increased or decreased activity seen; the uptake of I 131 sodium iodide at 24 hours was 5.7%.  TSH and free T4 normalized by 08/16/2009.  . Intestinal obstruction ([place])   . Lung nodule    Chest CT scan on 12/14/2008 showed a nodular opacity at the  left lung base felt to most likely represent scarring.  Follow-up chest CT scan on 06/02/2010 showed parenchymal scarring in the left apex, left  lower lobe, and lingula; some of this scarring at the left base had a nodular appearance, unchanged. Chest 09/2012 - stable.  [name] NICM (nonischemic cardiomyopathy) ([place])   . Noncompliance   . On home oxygen therapy    "3L periodically" (08/24/2015)  . Osteoarthritis   . Permanent atrial fibrillation ([place])    a. failed ablation in 2010 due to inability to access/venous occlusions. b. failed multaq, amiodarone, tikosyn. c. has not been felt to be a good candidate for AVN ablation due to recurrent infections/bacteremia history.  . Pleural plaque    H/o asbestos exposure. Chest CT on 06/02/2010 showed stable extensive calcified pleural plaques involving the left hemithorax, consistent with asbestos related pleural disease.  . Portal vein thrombosis   . Protein calorie malnutrition ([place]) 04/2016  . Spondylosis   . STEMI (ST elevation myocardial infarction) ([place]) 18/40/3754   a. felt embolic in nature due to noncompliance with anticoagulation, LHC not pursued.  . 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 b. St. Jude gen change 2010.  [name] Thrombocytopenia ([place])   . 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  . Weight loss    Normal  colonoscopy by Dr. Olevia Perches on 11/06/2010.   Review of Systems:  ROS: Pulmonary: pt denies increased work of breathing, endorses more frequent shortness of breath episodes since cough began,  Cardiac: pt denies palpitations, chest pain,  Abdominal: pt denies abdominal pain, nausea, vomiting, or diarrhea  Physical Exam:  Vitals:   11/18/17 1416  BP: 114/80  Pulse: 62  Temp: 97.9 F (36.6 C)  TempSrc: Oral  SpO2: 100%  Weight: 132 lb 14.4 oz (60.3 kg)   Physical Exam  Eyes: Right eye  exhibits no discharge. Left eye exhibits no discharge. No scleral icterus.  Cardiovascular: Normal rate. An irregularly irregular rhythm present. Exam reveals no gallop and no friction rub.  No murmur heard. Pulses:      Dorsalis pedis pulses are 0 on the right side, and 1+ on the left side.       Posterior tibial pulses are 0 on the right side, and 2+ on the left side.  Pulmonary/Chest: Effort normal. No respiratory distress. He has no wheezes. He has rhonchi in the left lower field. He has no rales.  Abdominal: Soft. Bowel sounds are normal. He exhibits no distension and no mass. There is no tenderness. There is no guarding.  Neurological: He is alert.  Skin: He is not diaphoretic.    Social History   Socioeconomic History  . Marital status: Widowed    Spouse name: Not on file  . Number of children: 10  . Years of education: Not on file  . Highest education level: Not on file  Occupational History  . Occupation: RETIRED    Employer: RETIRED  Social Needs  . Financial resource strain: Not on file  . Food insecurity:    Worry: Not on file    Inability: Not on file  . Transportation needs:    Medical: Not on file    Non-medical: Not on file  Tobacco Use  . Smoking status: Current Some Day Smoker    Packs/day: 0.25    Years: 54.00    Pack years: 13.50    Types: Cigarettes    Start date: 58  . Smokeless tobacco: Current User  . Tobacco comment: cutting back  Substance and Sexual Activity  . Alcohol use: No    Alcohol/week: 0.0 oz    Comment: History of ETOH Abuse."  . Drug use: Yes    Frequency: 1.0 times per week    Types: Marijuana    Comment: Every other day.   [name] Sexual activity: Not Currently  Lifestyle  . Physical activity:    Days per week: Not on file    Minutes per session: Not on file  . Stress: Not on file  Relationships  . Social connections:    Talks on phone: Not on file    Gets together: Not on file    Attends religious service: Not on file     Active member of club or organization: Not on file    Attends meetings of clubs or organizations: Not on file    Relationship status: Not on file  . Intimate partner violence:    Fear of current or ex partner: Not on file    Emotionally abused: Not on file    Physically abused: Not on file    Forced sexual activity: Not on file  Other Topics Concern  . Not on file  Social History Narrative   Retired Architect E75 yrs here in [employer]   Smoker 1 pack last 1.5 days tobacco, smokes  marijuana    Denies EtOH x 4 years (on 10/12/12)   9 kids    From [place]   [place] Veteran    12 grade education           Family History  Problem Relation Age of Onset  . Hypertension Mother   . Asthma Mother   . Coronary artery disease Mother   . Cancer Mother   . Stroke Mother   . Cancer Brother        Unsure of type.  [name] Heart disease Brother   . Heart attack Brother   . Heart attack Sister   . Colon cancer Neg Hx   . Lung cancer Neg Hx   . Prostate cancer Neg Hx     Assessment & Plan:   See Encounters Tab for problem based charting.  Patient discussed with Dr. Angelia Mould

## 2017-11-18 NOTE — Assessment & Plan Note (Signed)
Patient reports continued bilateral hip pain and worsening leg pain from his peripheral vascular disease.  He asks about going up on his pain medication.  I explained that going up on his current opiate dose which is substantial could have negative consequences.  He has had multiple episodes of severe impaction and ileus requiring hospitalization.  He is also had bouts of hypotension and falls.  Today he reports no falls since his last hospitalization in April.    -will refill a one month supply, pt can follow up with PCP and discuss alternative options to work in tandem with his opioids -will order a toxassure drug screen today as well

## 2017-11-18 NOTE — Patient Instructions (Addendum)
Mr. Kucher, we will have you follow up with your vascular doctor to keep an eye on the blood flow to your legs.  Please continue to try and quit smoking.  I am concerned based on your exam today that you are developing a pneumonia.  We will get an x-ray today to further evaluate this.  Continue taking your linzess daily.  We have refilled your pain medication for a one month supply.  You can come back in one month and discuss your pain goals and care with your primary doctor.

## 2017-11-18 NOTE — Assessment & Plan Note (Signed)
Urinalysis    Component Value Date/Time   COLORURINE YELLOW 08/22/2017 1257   APPEARANCEUR CLEAR 08/22/2017 1257   APPEARANCEUR Clear 06/20/2015 1405   LABSPEC 1.020 08/22/2017 1257   PHURINE 5.0 08/22/2017 1257   GLUCOSEU NEGATIVE 08/22/2017 1257   GLUCOSEU NEG mg/dL 12/04/2009 2201   HGBUR NEGATIVE 08/22/2017 1257   HGBUR trace-intact 09/01/2008 1533   BILIRUBINUR Small 11/18/2017 1607   BILIRUBINUR Negative 06/20/2015 1405   KETONESUR NEGATIVE 08/22/2017 1257   PROTEINUR Positive (A) 11/18/2017 1607   PROTEINUR NEGATIVE 08/22/2017 1257   UROBILINOGEN 2.0 (A) 11/18/2017 1607   UROBILINOGEN 1.0 11/11/2014 1518   NITRITE Positive 11/18/2017 1607   NITRITE NEGATIVE 08/22/2017 1257   LEUKOCYTESUR Moderate (2+) (A) 11/18/2017 1607   LEUKOCYTESUR Negative 06/20/2015 1405    Pt reports burning with urination just before we finished our visit.  I was able to add on a urinalysis that is suspicious for infection.    -I will treat empirically with bactrim DS for 7 days the patient was informed about abx therapy  -Urine culture and Urine microscopy to follow.

## 2017-11-18 NOTE — Assessment & Plan Note (Signed)
Pt on good medical therapy at this time, is trying to cut back smoking down to 1 pack per 3 days from 1 and 1/2 ppd.  His exam is concerning today.  The right foot is very cold and I cant appreciate pulses.  The left foot is a bit warmer but still cooler than it should be and I can palpate the pulses.  He reports his claudication symptoms and weakness have him wheelchair bound at this point.   -pt has not followed up with his vascular physician -we will have him reconnect so we can keep a closer eye on his vascular disease and have further evaluation of the right let especially

## 2017-11-19 ENCOUNTER — Telehealth: Payer: Self-pay | Admitting: Internal Medicine

## 2017-11-19 LAB — URINALYSIS, COMPLETE
BILIRUBIN UA: NEGATIVE
Glucose, UA: NEGATIVE
NITRITE UA: POSITIVE — AB
Specific Gravity, UA: 1.023 (ref 1.005–1.030)
UUROB: 1 mg/dL (ref 0.2–1.0)
pH, UA: 6 (ref 5.0–7.5)

## 2017-11-19 LAB — MICROSCOPIC EXAMINATION: Casts: NONE SEEN /lpf

## 2017-11-19 NOTE — Telephone Encounter (Signed)
Patient is calling about xray results, pls call patient

## 2017-11-19 NOTE — Telephone Encounter (Signed)
Spoke with pt about x-ray results does not appear to be a pneumonia on imaging.  Continue abx for UTI, urine culture results to follow, almost certainly will be sensitive to bactrim.  Instructed pt will call if it is not and change therapy.

## 2017-11-22 LAB — URINE CULTURE

## 2017-11-24 ENCOUNTER — Other Ambulatory Visit: Payer: Self-pay | Admitting: Internal Medicine

## 2017-11-24 ENCOUNTER — Other Ambulatory Visit: Payer: Self-pay

## 2017-11-24 DIAGNOSIS — I739 Peripheral vascular disease, unspecified: Secondary | ICD-10-CM

## 2017-11-24 LAB — TOXASSURE SELECT,+ANTIDEPR,UR

## 2017-11-25 ENCOUNTER — Other Ambulatory Visit: Payer: Self-pay | Admitting: Pharmacist

## 2017-11-25 DIAGNOSIS — I480 Paroxysmal atrial fibrillation: Secondary | ICD-10-CM

## 2017-11-25 DIAGNOSIS — Z86718 Personal history of other venous thrombosis and embolism: Secondary | ICD-10-CM

## 2017-11-25 DIAGNOSIS — I743 Embolism and thrombosis of arteries of the lower extremities: Secondary | ICD-10-CM

## 2017-11-25 DIAGNOSIS — J441 Chronic obstructive pulmonary disease with (acute) exacerbation: Secondary | ICD-10-CM

## 2017-11-25 DIAGNOSIS — I739 Peripheral vascular disease, unspecified: Secondary | ICD-10-CM

## 2017-11-25 DIAGNOSIS — D689 Coagulation defect, unspecified: Secondary | ICD-10-CM

## 2017-11-25 DIAGNOSIS — I252 Old myocardial infarction: Secondary | ICD-10-CM

## 2017-11-25 MED ORDER — APIXABAN 2.5 MG PO TABS: 2.5000 mg | ORAL_TABLET | Freq: Two times a day (BID) | ORAL | 5 refills | Status: AC

## 2017-11-25 NOTE — Progress Notes (Addendum)
Contacted patient to follow up on medication help. Patient states he is doing well overall, requested refill on Eliquis---refill sent due to new PCP. He reports shortness of breath requiring albuterol 2 times per week. He endorses using Spiriva daily. Advised patient to notify PCP at upcoming appointment 12/17/17 or schedule [facility] appointment if symptoms persist/worsen. Patient verbalized understanding.

## 2017-11-28 IMAGING — CR DG CHEST 2V
2 series · 4 of 4 positions shown · non-contrast
Comparison: Previous examinations, the most recent dated
02/02/2016.

CLINICAL DATA: Weight loss.  Lung nodule.  COPD.

EXAM:
CHEST  2 VIEW

[Series 2: chest lat · 0.14mm/px · 2 of 2 slices shown]
[im 1/2]
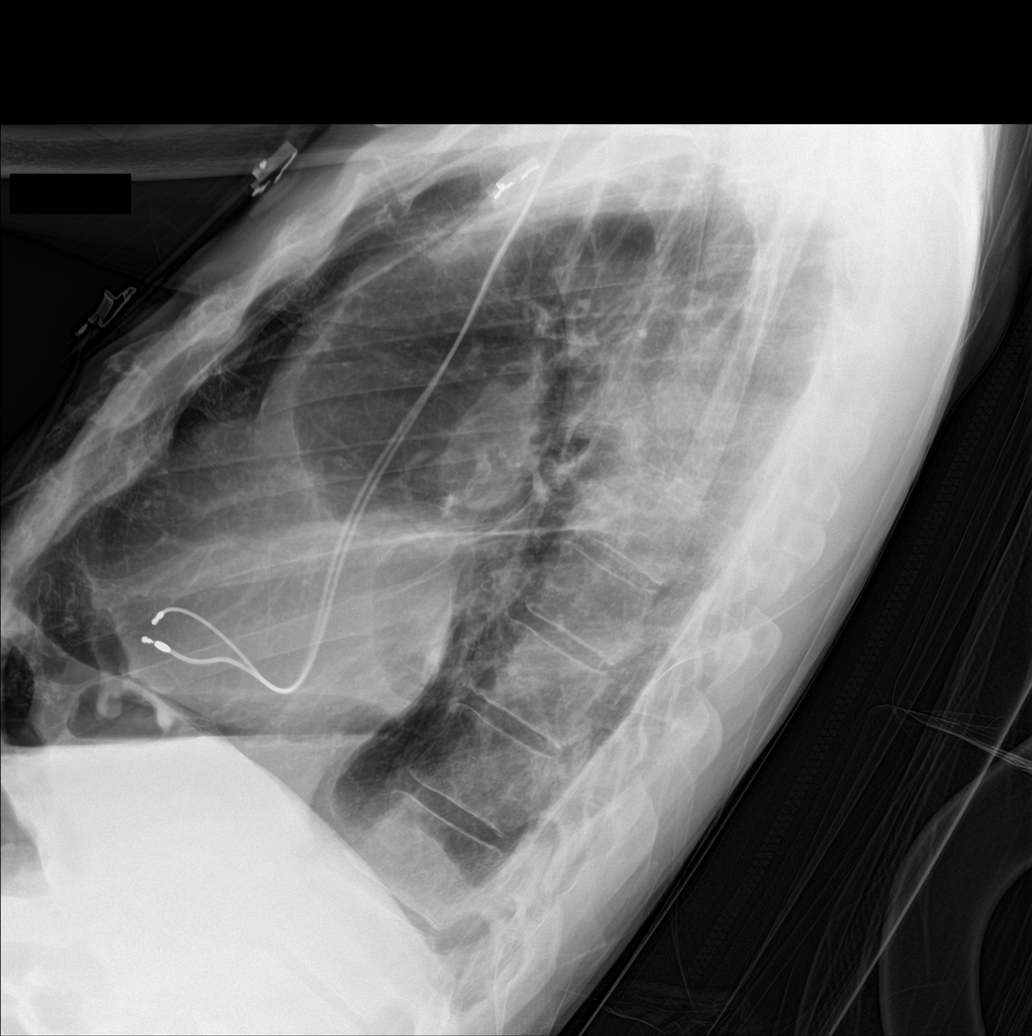
[im 2/2]
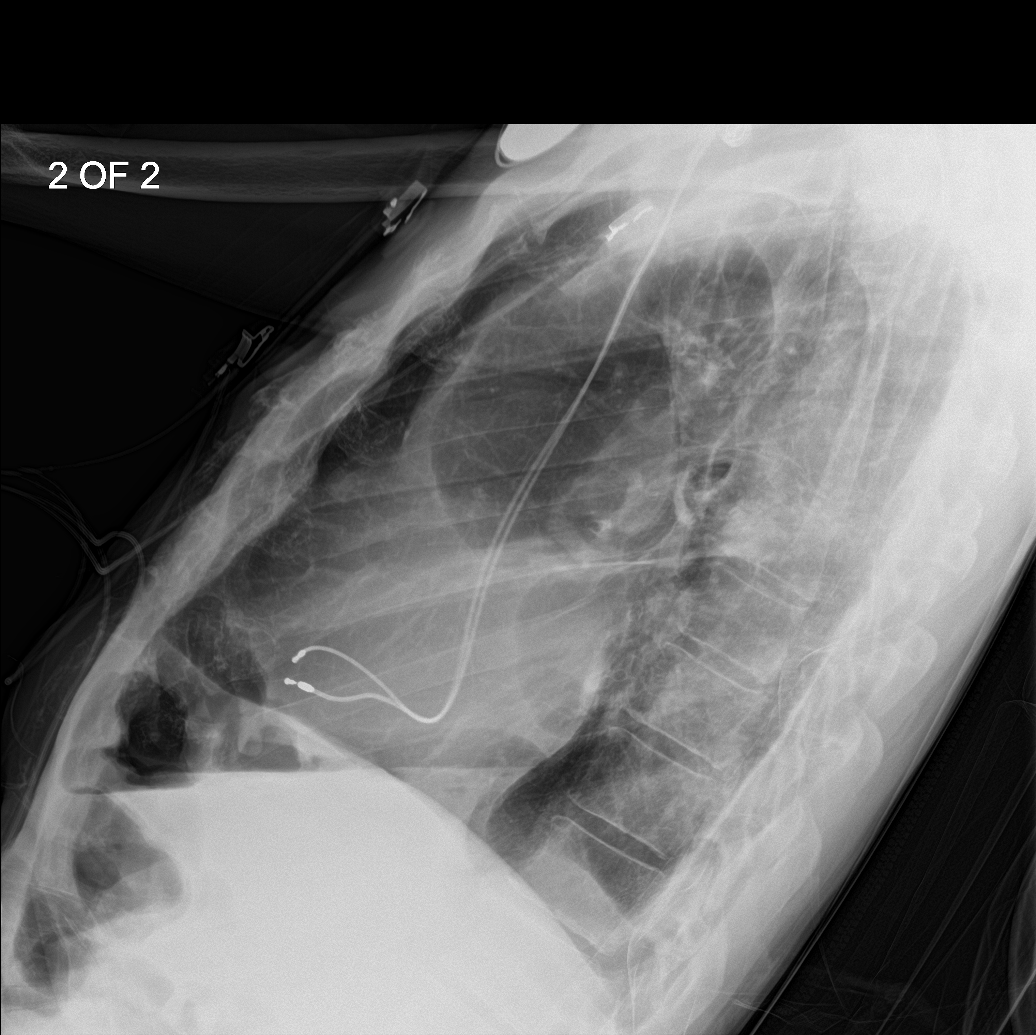

[Series 3: chest ap · 0.14mm/px · 2 of 2 slices shown]
[im 1/2]
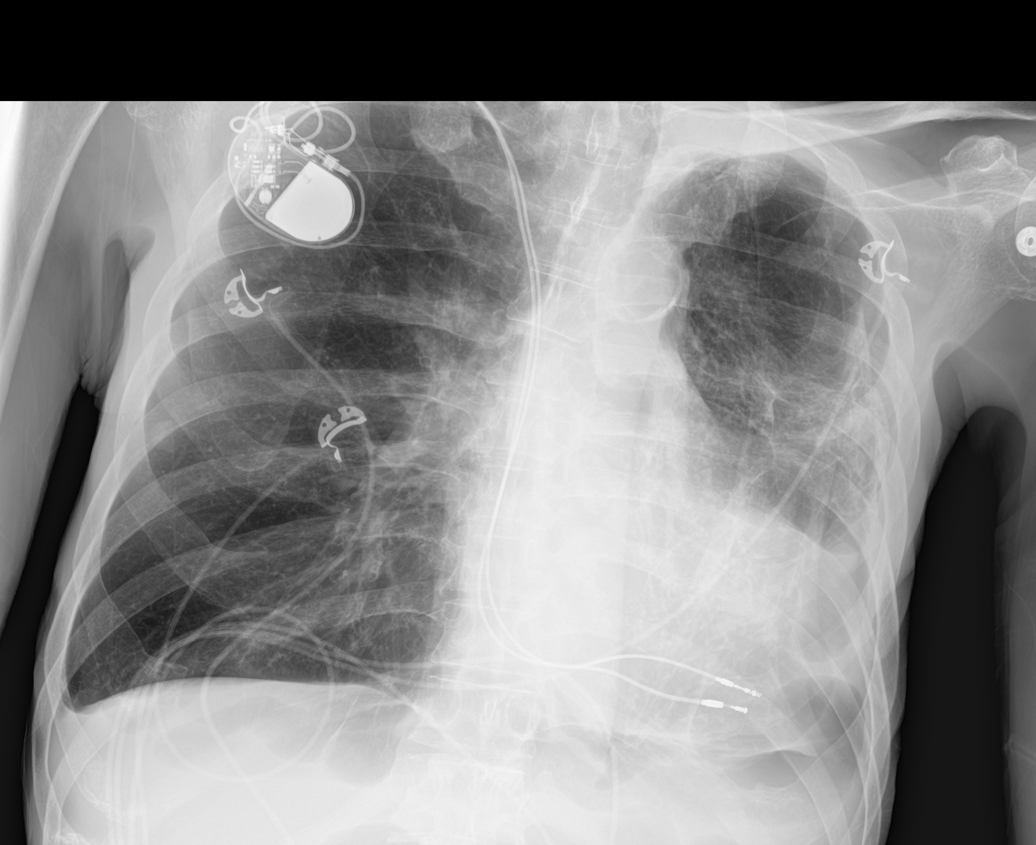
[im 2/2]
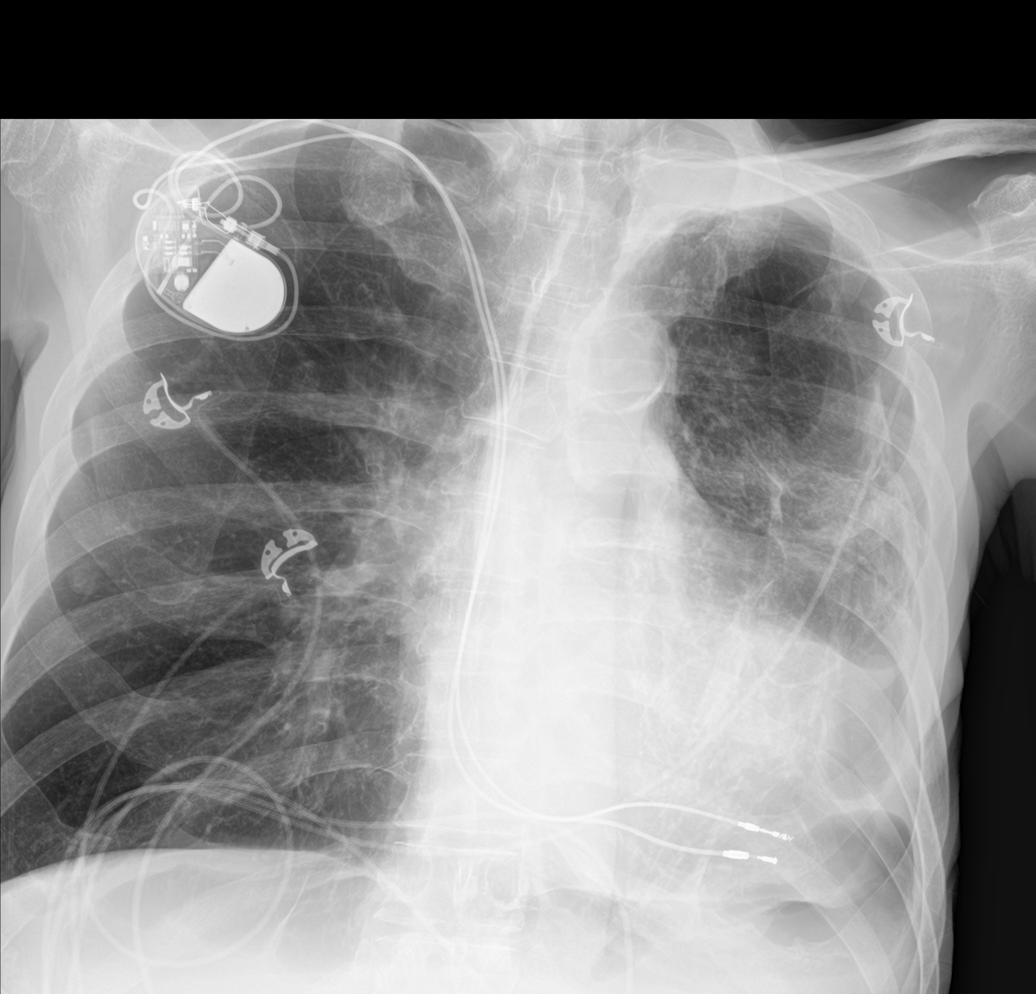

[4 of 4 positions shown; findings below may reference images not displayed]

FINDINGS: Stable chronic pleural thickening and calcified pleural plaques,
greater on the left. These again simulate a left lateral
pneumothorax. Stable right nipple shadow. Grossly normal sized
heart. Calcified thoracic aorta. Stable right subclavian pacemaker
leads. Mild scoliosis and diffuse osteopenia.
IMPRESSION: No acute abnormality.  Stable chronic changes, as described above.

## 2017-12-08 ENCOUNTER — Encounter: Payer: Self-pay | Admitting: Cardiology

## 2017-12-09 ENCOUNTER — Encounter: Payer: Self-pay | Admitting: *Deleted

## 2017-12-10 ENCOUNTER — Other Ambulatory Visit: Payer: Self-pay | Admitting: Internal Medicine

## 2017-12-17 ENCOUNTER — Ambulatory Visit (INDEPENDENT_AMBULATORY_CARE_PROVIDER_SITE_OTHER): Payer: Medicare Other | Admitting: Internal Medicine

## 2017-12-17 ENCOUNTER — Other Ambulatory Visit: Payer: Self-pay

## 2017-12-17 ENCOUNTER — Encounter: Payer: Self-pay | Admitting: Internal Medicine

## 2017-12-17 DIAGNOSIS — R300 Dysuria: Secondary | ICD-10-CM | POA: Diagnosis not present

## 2017-12-17 DIAGNOSIS — R42 Dizziness and giddiness: Secondary | ICD-10-CM | POA: Diagnosis not present

## 2017-12-17 DIAGNOSIS — Z993 Dependence on wheelchair: Secondary | ICD-10-CM

## 2017-12-17 DIAGNOSIS — J449 Chronic obstructive pulmonary disease, unspecified: Secondary | ICD-10-CM

## 2017-12-17 DIAGNOSIS — Z86718 Personal history of other venous thrombosis and embolism: Secondary | ICD-10-CM

## 2017-12-17 DIAGNOSIS — R531 Weakness: Secondary | ICD-10-CM

## 2017-12-17 DIAGNOSIS — K5903 Drug induced constipation: Secondary | ICD-10-CM

## 2017-12-17 DIAGNOSIS — F1011 Alcohol abuse, in remission: Secondary | ICD-10-CM

## 2017-12-17 DIAGNOSIS — I130 Hypertensive heart and chronic kidney disease with heart failure and stage 1 through stage 4 chronic kidney disease, or unspecified chronic kidney disease: Secondary | ICD-10-CM

## 2017-12-17 DIAGNOSIS — Z0000 Encounter for general adult medical examination without abnormal findings: Secondary | ICD-10-CM

## 2017-12-17 DIAGNOSIS — E43 Unspecified severe protein-calorie malnutrition: Secondary | ICD-10-CM

## 2017-12-17 DIAGNOSIS — G5793 Unspecified mononeuropathy of bilateral lower limbs: Secondary | ICD-10-CM

## 2017-12-17 DIAGNOSIS — G629 Polyneuropathy, unspecified: Secondary | ICD-10-CM

## 2017-12-17 DIAGNOSIS — I4891 Unspecified atrial fibrillation: Secondary | ICD-10-CM

## 2017-12-17 DIAGNOSIS — I5020 Unspecified systolic (congestive) heart failure: Secondary | ICD-10-CM

## 2017-12-17 DIAGNOSIS — Z79891 Long term (current) use of opiate analgesic: Secondary | ICD-10-CM

## 2017-12-17 DIAGNOSIS — E785 Hyperlipidemia, unspecified: Secondary | ICD-10-CM

## 2017-12-17 DIAGNOSIS — Z681 Body mass index (BMI) 19 or less, adult: Secondary | ICD-10-CM

## 2017-12-17 DIAGNOSIS — I252 Old myocardial infarction: Secondary | ICD-10-CM

## 2017-12-17 DIAGNOSIS — R630 Anorexia: Secondary | ICD-10-CM

## 2017-12-17 DIAGNOSIS — R64 Cachexia: Secondary | ICD-10-CM

## 2017-12-17 DIAGNOSIS — N183 Chronic kidney disease, stage 3 (moderate): Secondary | ICD-10-CM

## 2017-12-17 DIAGNOSIS — Z880 Allergy status to penicillin: Secondary | ICD-10-CM

## 2017-12-17 LAB — POCT URINALYSIS DIPSTICK
GLUCOSE UA: NEGATIVE
NITRITE UA: NEGATIVE
PROTEIN UA: POSITIVE — AB
Urobilinogen, UA: 1 E.U./dL
pH, UA: 7 (ref 5.0–8.0)

## 2017-12-17 MED ORDER — OXYCODONE-ACETAMINOPHEN 10-325 MG PO TABS: 1.0000 | ORAL_TABLET | ORAL | 0 refills | Status: DC | PRN

## 2017-12-17 MED ORDER — DRONABINOL 10 MG PO CAPS: 10.0000 mg | ORAL_CAPSULE | Freq: Two times a day (BID) | ORAL | 1 refills | Status: AC

## 2017-12-17 MED ORDER — LINACLOTIDE 145 MCG PO CAPS: 145.0000 ug | ORAL_CAPSULE | Freq: Every day | ORAL | 3 refills | Status: AC

## 2017-12-17 NOTE — Progress Notes (Signed)
CC: Establish care with new PCP  HPI:  [name] is a 72 y.o. with a history of alcohol abuse, Afib with RVR, CHF with systolic dysfunction (Last Echo 07/2015 EF 30%), Stage III CKD, COPD, DVT, HLD, HTN, MI, who presents to establish care with new PCP.  Patient reports that over the last several weeks he has been ambulating less and is now wheel-chair bound. He reports that he has generalized weakness and bilateral foot numbness that is making it hard for him to walk. He reports that he has an appointment with the vascular surgeon in September, 2019. He also endorses increasing light-headedness over the last several weeks. He becomes light-headed when he sits up from bed and when he stands up. He denies dizziness and headaches. He continues to endorse decreased appetite and believes that he has lost weight over the last several weeks.   He also endorses a several year history of SOB and continued productive cough over the past 3 months. He states that his symptoms are improved with Albuterol use (1-2 times per week). He denies fevers. He reports mild dysuria but denies hematuria. Denies changes in bowel movements.     Past Medical History:  Diagnosis Date  . Alcohol abuse   . Anxiety   . Asbestos exposure   . Atrial fibrillation with RVR ([place]) 01/17/2014  . Avascular necrosis of bones of both hips (HCC)    Status post bilateral hip replacements  . Bipolar disorder ([place])   . Chronic systolic heart failure ([place])    a. NICM - EF 35% with normal cors 2003. b. Last echo 11/2012 - EF 25-30%.  . CKD (chronic kidney disease), stage III ([place])    Renal U/S 12/04/2009 showed no pathological findings. Labs 12/04/2009 include normal ESR, C3, C4; neg ANA; SPEP showed nonspecific increase in the alpha-2 region with no M-spike; UPEP showed no monoclonal free light chains; urine IFE showed polyclonal increase in feree Kappa and/or free Lambda light chains. Baseline Cr reported 1.7-2.5.  [name] COPD  (chronic obstructive pulmonary disease) ([place])   . Depression   . DVT (deep venous thrombosis) ([place])    He has hypercoagulability with multiple prior DVTs  . E coli bacteremia   . Erectile dysfunction   . Failure to thrive (0-17) 08/2016   DEHYDRATION  . Gastritis   . GERD (gastroesophageal reflux disease)   . HCAP (healthcare-associated pneumonia) 08/24/2015  . Hydrocele, unspecified   . Hyperlipemia   . Hypertension   . 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  Thyroid scan 08/28/2009 was normal with no focal areas of abnormal increased or decreased activity seen; the uptake of I 131 sodium iodide at 24 hours was 5.7%.  TSH and free T4 normalized by 08/16/2009.  . Intestinal obstruction ([place])   . Lung nodule    Chest CT scan on 12/14/2008 showed a nodular opacity at the left lung base felt to most likely represent scarring.  Follow-up chest CT scan on 06/02/2010 showed parenchymal scarring in the left apex, left  lower lobe, and lingula; some of this scarring at the left base had a nodular appearance, unchanged. Chest 09/2012 - stable.  [name] NICM (nonischemic cardiomyopathy) ([place])   . Noncompliance   . On home oxygen therapy    "3L periodically" (08/24/2015)  . Osteoarthritis   . Permanent atrial fibrillation ([place])    a. failed ablation in 2010 due to inability to access/venous occlusions. b. failed multaq, amiodarone, tikosyn.  c. has not been felt to be a good candidate for AVN ablation due to recurrent infections/bacteremia history.  . Pleural plaque    H/o asbestos exposure. Chest CT on 06/02/2010 showed stable extensive calcified pleural plaques involving the left hemithorax, consistent with asbestos related pleural disease.  . Portal vein thrombosis   . Protein calorie malnutrition ([place]) 04/2016  . Spondylosis   . STEMI (ST elevation myocardial infarction) ([place]) 59/74/1638   a. felt embolic in nature due to noncompliance with anticoagulation, LHC  not pursued.  . 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 b. St. Jude gen change 2010.  [name] Thrombocytopenia ([place])   . 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  . Weight loss    Normal colonoscopy by Dr. Olevia Perches on 11/06/2010.   Review of Systems: Review of Systems  Constitutional: Positive for weight loss. Negative for chills, fever and malaise/fatigue.  HENT: Positive for congestion. Negative for sore throat.   Respiratory: Positive for cough, sputum production and shortness of breath. Negative for hemoptysis.   Cardiovascular: Negative for chest pain, palpitations, claudication and leg swelling.  Gastrointestinal: Negative for abdominal pain, constipation, diarrhea, nausea and vomiting.  Genitourinary: Positive for dysuria. Negative for frequency, hematuria and urgency.  Musculoskeletal:       LE pain bilaterally  Skin: Negative for rash.  Neurological: Positive for sensory change (Numbness to soles of feet bilaterally) and weakness (Generalized). Negative for dizziness, focal weakness and headaches.  All other systems reviewed and are negative.   Physical Exam:  Vitals:   12/17/17 1503  BP: 107/68  Pulse: 66  Temp: (!) 97.3 F (36.3 C)  TempSrc: Oral  SpO2: 99%  Weight: 128 lb 4.8 oz (58.2 kg)  Height: '6\' 3"'  (1.905 m)   Physical Exam  Constitutional: He is oriented to person, place, and time.  Cachectic appearing male, sitting up in wheel-chair, in no acute distress  HENT:  Head: Normocephalic and atraumatic.  Eyes: Pupils are equal, round, and reactive to light. EOM are normal.  Neck: Normal range of motion. No thyromegaly present.  Cardiovascular: Normal rate and regular rhythm.  Pulmonary/Chest: Effort normal. No respiratory distress. He has decreased breath sounds in the left middle field and the left lower field.  Abdominal:  Soft. Bowel sounds are normal. He exhibits no distension. There is no tenderness.  Musculoskeletal: Normal range of motion.  Neurological: He is alert and oriented to person, place, and time. He has normal reflexes. He displays weakness (Decreased strength in lower extremities distally (ankles) bilaterally. Normal strength in proximal LE bilaterally.. Normal strength in UE bilaterally.). He displays normal reflexes. A sensory deficit (Decreased sensation to sole of the feet bilaterally.) is present. He exhibits abnormal muscle tone (Decreased tone in LE bilaterally). Coordination normal.  Skin: Skin is warm and dry.  Psychiatric: Affect and judgment normal.    Assessment & Plan:   See Encounters Tab for problem based charting.  Patient seen with Dr. Beryle Beams

## 2017-12-17 NOTE — Assessment & Plan Note (Signed)
Patient has had a several year history of generalized weakness. This weakness has become more several in the last several weeks and is currently wheel-chair bound. Physical exam showed distal LE weakness and bilateral numbness 2/2 peripheral neuropathy. Weakness is likely multifactorial in nature (peripheral vascular disease, peripheral neuropathy, and deconditioning).  - Referral to home health to help with ADL's

## 2017-12-17 NOTE — Assessment & Plan Note (Addendum)
Patient reports a 1.5 year history of lightheadedness. Orthostatics negative today. Blood pressure is low normal today (107/68). Patient's symptoms are likely secondary to poor PO intake and being bed-bound for much of the day. Patient reports only drinking 4 glasses of water per day. - Encouraged drinking at least 8 large glasses of water per day.  - Referral to home health

## 2017-12-18 ENCOUNTER — Telehealth: Payer: Self-pay | Admitting: Internal Medicine

## 2017-12-18 ENCOUNTER — Other Ambulatory Visit: Payer: Self-pay | Admitting: Internal Medicine

## 2017-12-18 MED ORDER — LEVOFLOXACIN 250 MG PO TABS: 250.0000 mg | ORAL_TABLET | Freq: Every day | ORAL | 0 refills | Status: AC

## 2017-12-18 NOTE — Telephone Encounter (Signed)
Spoke with [name] over the phone about status of referral to [facility]. I explained to him that I have already put in the referral and he will likely here from [facility] soon. I also spoke to him about his urinalysis results which were concerning for a urinary tract infection. I prescribed Levofloxacin 250 mg x 10 days. I also gave strict return precautions including worsening dysuria and hematuria as well as development of fever or flank pain. Patient verbalized understanding and agreement.

## 2017-12-18 NOTE — Telephone Encounter (Signed)
Pt is wanted to have a physician called him, pt is requesting home health to come out to his house, pt contact# [phone]

## 2017-12-18 NOTE — Progress Notes (Signed)
[name] was seen in the clinic yesterday 12/17/17. He endorsed mild dysuria (initially improved with Bactrim 3 weeks ago). His UA on 12/17/17 is improved but still shows trace blood, small leukocytes, trace ketones. Patient's previous Urine culture grew Aerococcus viridans which is likely a contaminant. Given that the patient continues to have signs and symptoms of urinary tract infection, will treat with Levofloxacin 250 mg x 10 days. I called the patient today, informing him of his results and treatment recommendations. Patient verbalized agreement and understanding.

## 2017-12-19 ENCOUNTER — Telehealth: Payer: Self-pay | Admitting: *Deleted

## 2017-12-19 NOTE — Telephone Encounter (Signed)
SPOKE WITH SUSAN AT [facility] FOR PATIENT. REFERRAL FAXED / SUSAN WILL RETURN CALL TO [facility] AS TO IF THEY CAN SEE PATIENT.

## 2017-12-20 NOTE — Progress Notes (Signed)
Medicine attending: I personally interviewed and briefly examined this patient on the day of the patient visit and reviewed pertinent clinical ,laboratory, and radiographic data  with resident physician Dr. Nita Sickle and we discussed a management plan. Patient well known to me from multiple inpatient admissions. He is now declining. Appears cachectic. Generalized weakness. 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 Increasing inability to ambulate. Still living by himself. Fiercely independent. Prior attempts at assisted living facility failed. We will try to arrange for a home health aide. We addressed end of life issues and he reiterates that he does not wish to be kept alive by artifical means. He is having persistent urinary tract symptoms despite recent Rx. Unusual bacteria in urine - I believe was a skin contaminant. He has a PCN allergy. Recent Rx w Septra. We will prescribe 10 d course of levoquin 250 mg QD.

## 2017-12-22 ENCOUNTER — Telehealth: Payer: Self-pay | Admitting: Internal Medicine

## 2017-12-22 NOTE — Telephone Encounter (Signed)
Please call pt concerning medicine; pt contact# [phone]

## 2017-12-22 NOTE — Telephone Encounter (Signed)
Hi dr Alfonse Spruce, pt called and stated the pharmacy would only give him 150 of the oxy instead of 180 because even though the directions stated 1 every 4 hrs = 180, the next sentence stated do not take more than 5 daily so this would be 150. Pt went ahead and took the 150 and will have to pay out of pocket for the other 30 if he is given another script this month but he states he knows he will probably need the other 30. Please review and advise

## 2017-12-29 NOTE — Telephone Encounter (Signed)
Spoke with [name] about his Oxycodone/Acetaminophen prescription. Per chart review, patient has been previously managed on Oxy/Acetaminophen 5-325 q4h PRN pain #150 with reported control of pain. Patient states that he is "doesn't know how much he's taking" but "is still in a lot of pain." I recommend not taking more than 5 tablets per day and to document the amount that he is actually taking per day. Patient educated on the risks of taking a large number of Percocet tablets. I will continue the patient on 150 tablets per month and re-evaluate at next clinic visit. Will consider establishing a new pain contract with myself (new provider) and referral to pain management specialists.

## 2017-12-30 ENCOUNTER — Telehealth: Payer: Self-pay | Admitting: *Deleted

## 2017-12-30 NOTE — Telephone Encounter (Signed)
So noted. Thank you for this information.

## 2017-12-30 NOTE — Telephone Encounter (Signed)
I agree to the home health verbal order. Thank you so much for your help!

## 2017-12-30 NOTE — Telephone Encounter (Signed)
[facility] called and stated pt does not have marinol nor linzess in the home, reviewed medlist, [facility], ask her to have his family pick them up

## 2017-12-30 NOTE — Telephone Encounter (Signed)
Call from [place] at [facility] - stated she unable to see pt last week, he would not answer the telephone. Requesting verbal order to see pt today; vo given, if not ok let me know.

## 2017-12-30 NOTE — Telephone Encounter (Signed)
Checked my messages from Friday - message from [place] who tried to see pt but the person who answered the door told him pt was sleeeping but the therapist had called the day before. So pt will be dismissed from their [place].

## 2017-12-31 IMAGING — DX DG CHEST 1V PORT
2 series · 2 of 2 positions shown · non-contrast
Comparison: 09/05/2016

CLINICAL DATA: Near syncope

EXAM:
PORTABLE CHEST 1 VIEW

[chest ap (1 of 2)]
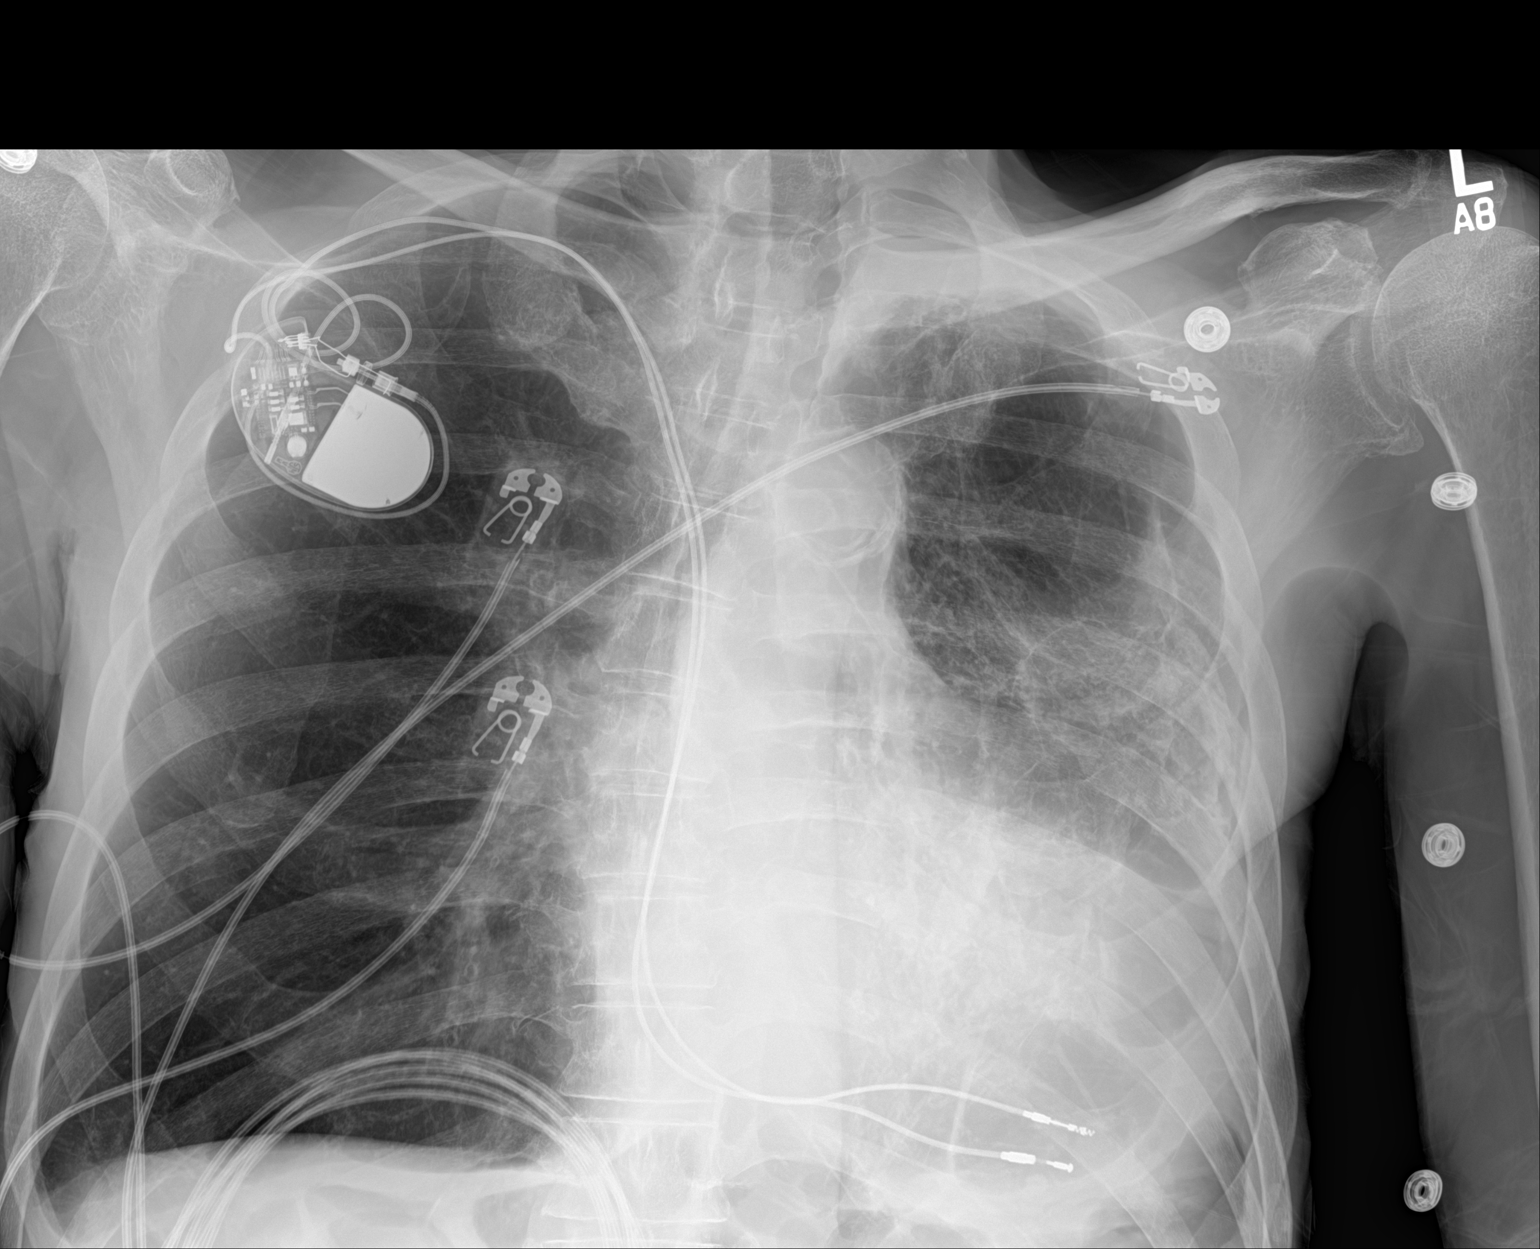

[chest ap (2 of 2)]
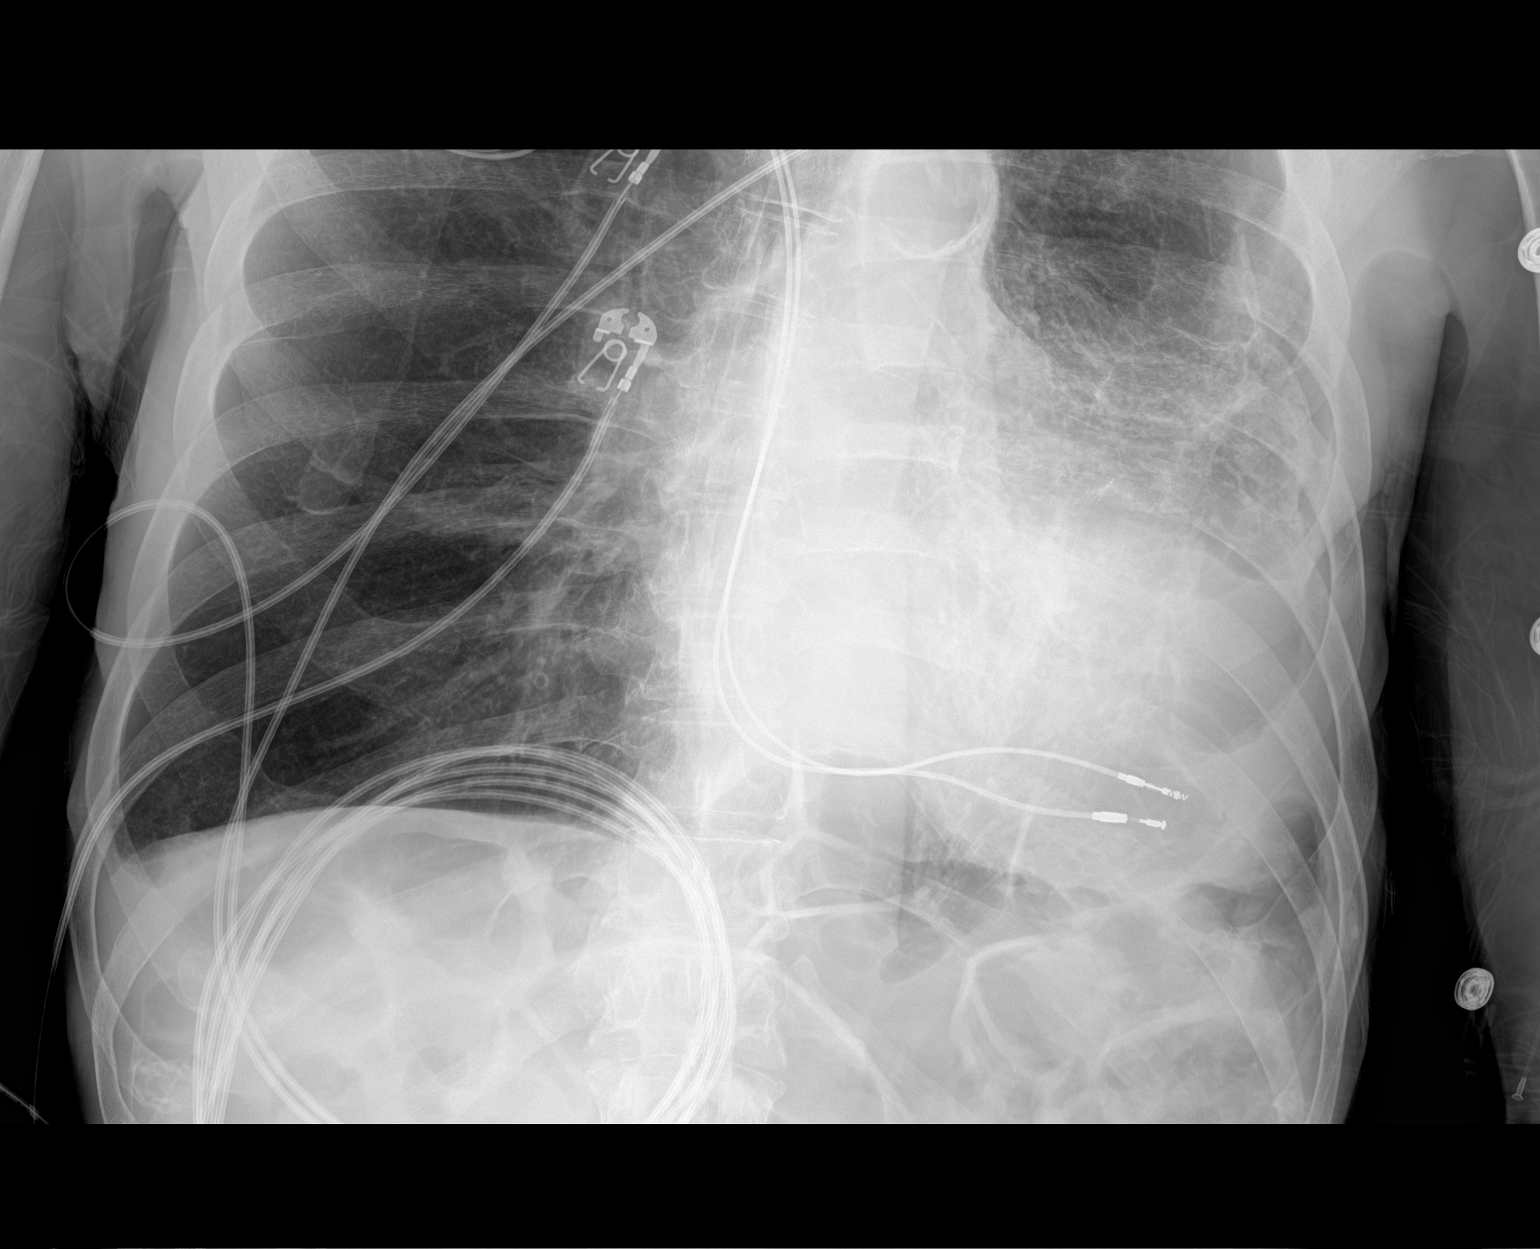

[2 of 2 positions shown; findings below may reference images not displayed]

FINDINGS: COPD. Chronic pleuroparenchymal scarring in the left lung base
unchanged. Calcified pleural plaque in the left lung base unchanged
from prior CT. Right lung clear. Negative for heart failure.
Transvenous pacemaker unchanged. Two leads in the right ventricle
similar to the prior study. Atherosclerotic aorta.
IMPRESSION: Extensive chronic pleuroparenchymal scarring in the left lung base.
No acute abnormality.

## 2018-01-07 ENCOUNTER — Telehealth: Payer: Self-pay | Admitting: Internal Medicine

## 2018-01-07 NOTE — Telephone Encounter (Signed)
Returned call to [employer]. No answer. Left message on VM requesting return call. Hubbard Hartshorn, RN, BSN

## 2018-01-07 NOTE — Telephone Encounter (Signed)
Jackelyn Poling, SW with [place] returned call. Wanted to make PCP aware that patient is very depressed. This affects him allowing PT to come to home. Nursing has only one visit left and then he will be discharged from [facility]. Jackelyn Poling has discussed grief counseling with patient for loss of brother 2 months ago. Also made referral to palliative care with [facility] and [facility] which will allow him to have weekly nursing visits. Patient is not yet ready to discuss Hospice care. She will have nursing let us know if patient is taking buproprion BID as instructed. Hubbard Hartshorn, RN, BSN

## 2018-01-07 NOTE — Telephone Encounter (Signed)
[name] from [facility] [phone]; would like to speak to someone regarding pt; pt has been saying he is Depression, crying out of blue(moaning over the lost of brother) pt is very weak and mobility is limit.

## 2018-01-09 NOTE — Telephone Encounter (Signed)
Hey! I appreciate everyone's help. Will you please set up an appointment so we can talk over options for future home health care vs hospice?

## 2018-01-13 ENCOUNTER — Encounter: Payer: Medicare Other | Admitting: Vascular Surgery

## 2018-01-13 ENCOUNTER — Encounter (HOSPITAL_COMMUNITY): Payer: Medicare Other

## 2018-01-13 ENCOUNTER — Other Ambulatory Visit: Payer: Self-pay | Admitting: Internal Medicine

## 2018-01-13 DIAGNOSIS — Z79891 Long term (current) use of opiate analgesic: Secondary | ICD-10-CM

## 2018-01-13 NOTE — Telephone Encounter (Signed)
Thank you :)

## 2018-01-13 NOTE — Telephone Encounter (Signed)
[place] would like Dr Alfonse Spruce to give him a call, he really needs to speak with her. Pt contact# [phone]

## 2018-01-13 NOTE — Telephone Encounter (Signed)
Refill Request-  Pt would like a call back about his Pain Medication  oxyCODONE-acetaminophen (PERCOCET) 10-325 MG tablet

## 2018-01-13 NOTE — Telephone Encounter (Signed)
Patient has appt with PCP 01/27/2018. Hubbard Hartshorn, RN, BSN

## 2018-01-14 ENCOUNTER — Other Ambulatory Visit: Payer: Self-pay | Admitting: Internal Medicine

## 2018-01-14 ENCOUNTER — Telehealth: Payer: Self-pay | Admitting: *Deleted

## 2018-01-14 DIAGNOSIS — Z79891 Long term (current) use of opiate analgesic: Secondary | ICD-10-CM

## 2018-01-14 MED ORDER — OXYCODONE-ACETAMINOPHEN 10-325 MG PO TABS: 1.0000 | ORAL_TABLET | ORAL | 0 refills | Status: DC | PRN

## 2018-01-14 NOTE — Telephone Encounter (Signed)
Sree, [facility] PT called from patient's home stating BP 90/64 and patient reports dizziness with standing and walking. Denies falls. Next appt with PCP 01/27/2018. Please advise. Hubbard Hartshorn, RN, BSN

## 2018-01-14 NOTE — Telephone Encounter (Signed)
I spoke with patient over the phone . He states that he had several episodes of diarrhea yesterday which has now self-resolved. He has also felt dizziness with standing and walking. His hypotension is likely secondary to hypovolemia in the setting of diarrhea. I recommended holding his night dose of metoprolol and drinking lots of fluids. I also recommended following up in clinic sooner if he continues to have these episodes. He expressed understanding and agreement.

## 2018-01-22 ENCOUNTER — Telehealth: Payer: Self-pay | Admitting: Internal Medicine

## 2018-01-22 IMAGING — CT CT CTA ABD/PEL W/CM AND/OR W/O CM
3 of 9 series · 15 of 46 positions shown, 16 images · IV contrast (Omni 300)
Comparison: 02/02/2016 CT

CLINICAL DATA: Abdominal pain and hypertension. History of atrial
fibrillation. Evaluate for ischemia.

EXAM:
CTA ABDOMEN AND PELVIS WITHOUT AND WITH CONTRAST
TECHNIQUE: Multidetector CT imaging of the abdomen and pelvis was performed
using the standard protocol during bolus administration of
intravenous contrast. Multiplanar reconstructed images and MIPs were
obtained and reviewed to evaluate the vascular anatomy.
CONTRAST:  100 cc Isovue 370 IV

[Series 7: renal cta · axial · 0.71mm/px · z∈[+708,+918]mm · 5 of 150 slices shown]
[im 10/150  soft-tissue]
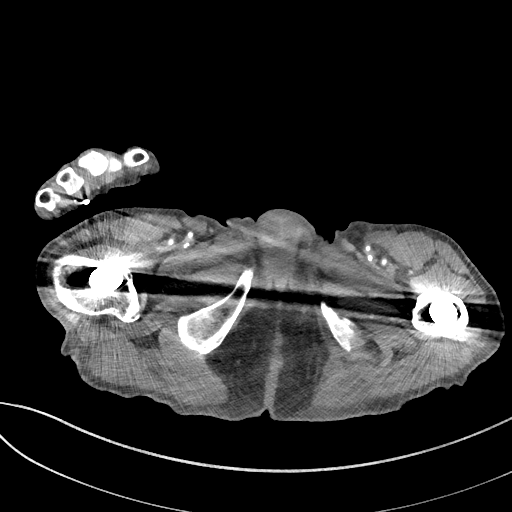
[im 30/150  soft-tissue]
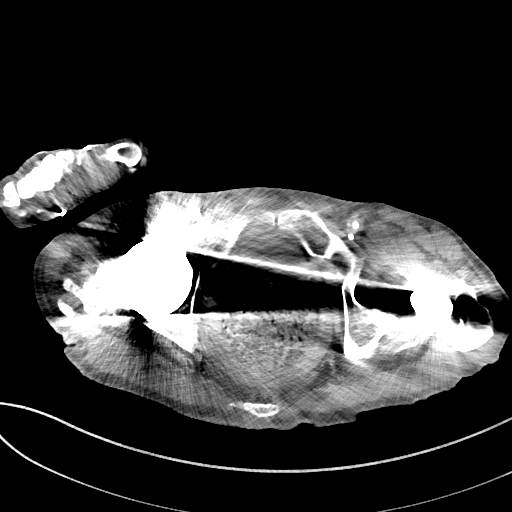
[im 50/150  soft-tissue]
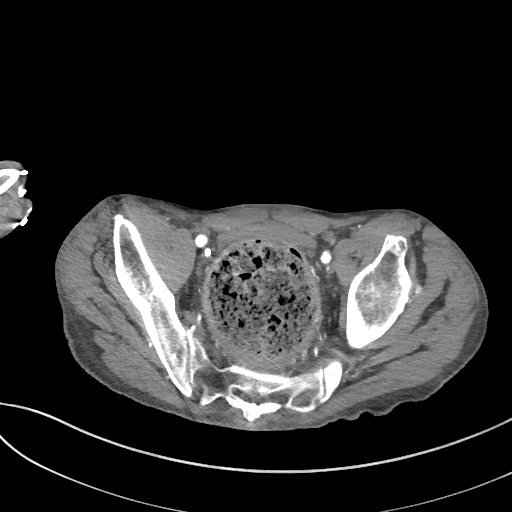
[im 70/150  soft-tissue]
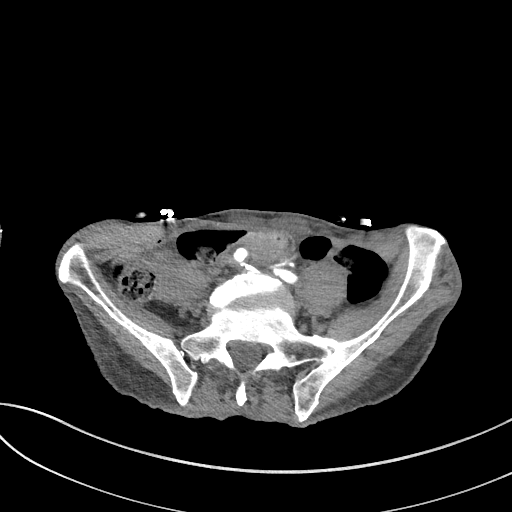
[im 80/150  soft-tissue]
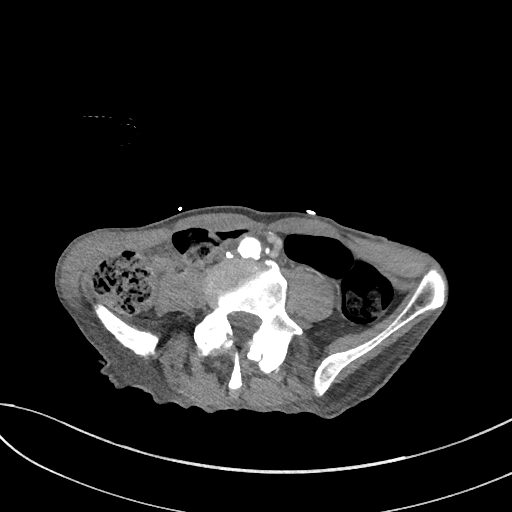

[Series 13: venous 5.0 · axial · portal-venous · 0.72mm/px · z∈[+731,+1081]mm · 8 of 91 slices shown, 9 images]
[im 11/91  soft-tissue]
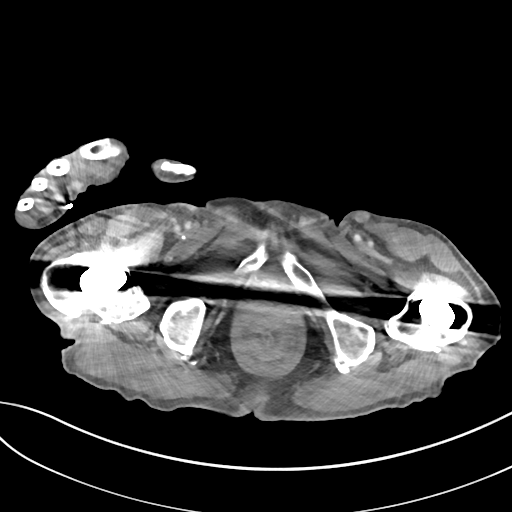
[im 11/91  bone]
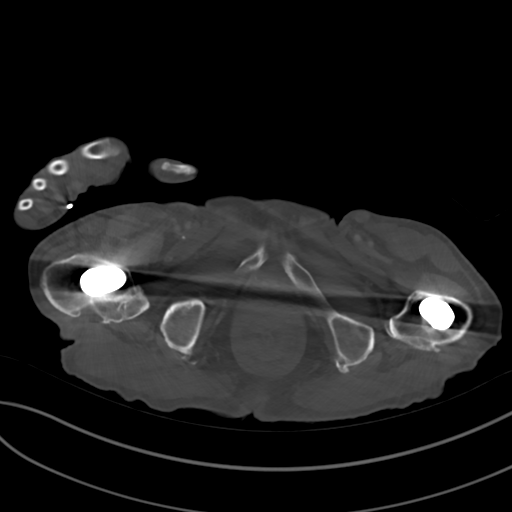
[im 21/91  soft-tissue]
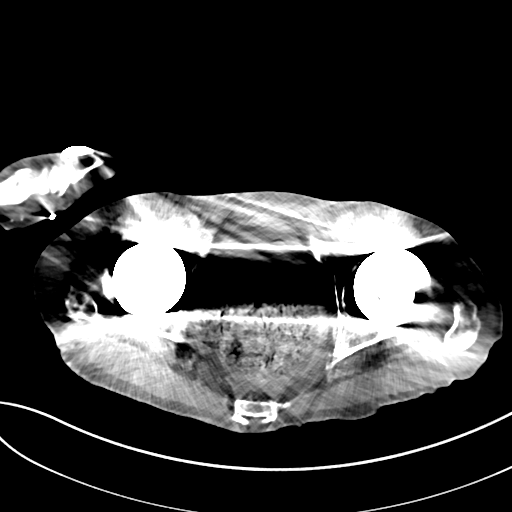
[im 31/91  soft-tissue]
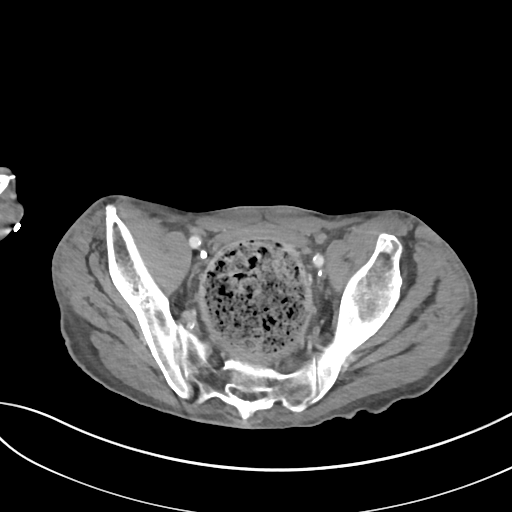
[im 41/91  soft-tissue]
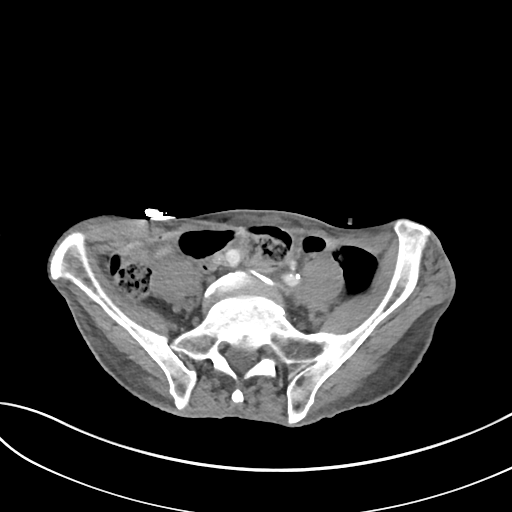
[im 51/91  soft-tissue]
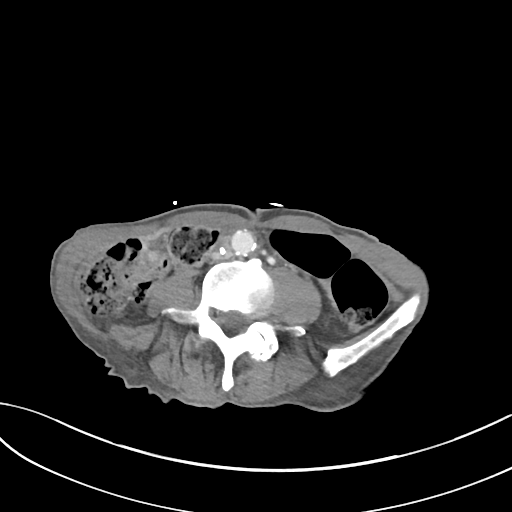
[im 61/91  soft-tissue]
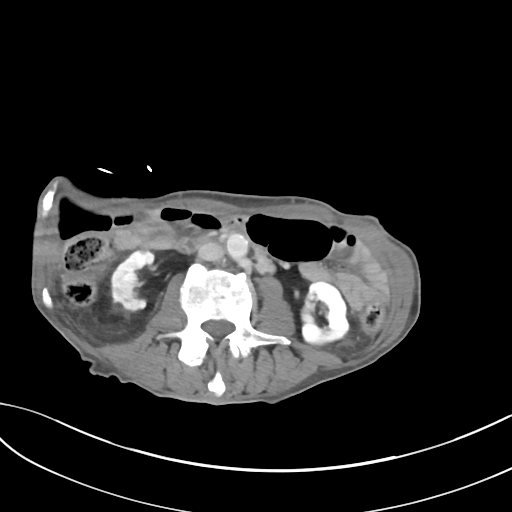
[im 71/91  soft-tissue]
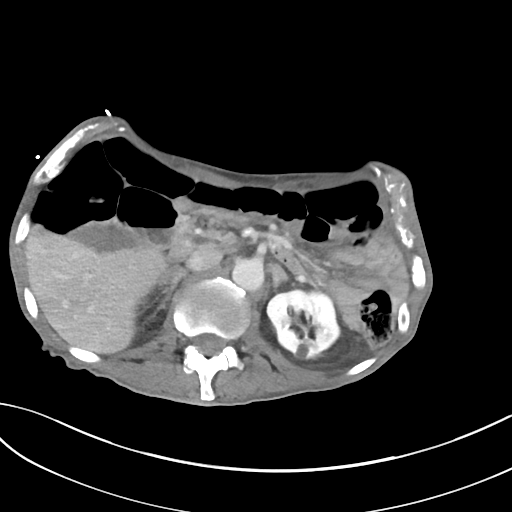
[im 81/91  soft-tissue]
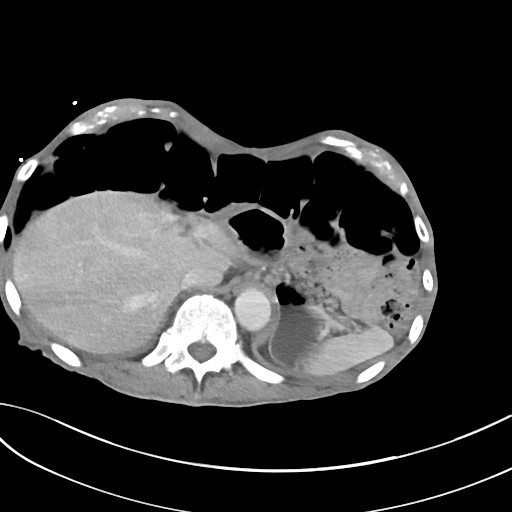

[Series 16: venous 2.0 cor · coronal · portal-venous · 0.65mm/px · 2 of 115 slices shown]
[im 39/115  soft-tissue]
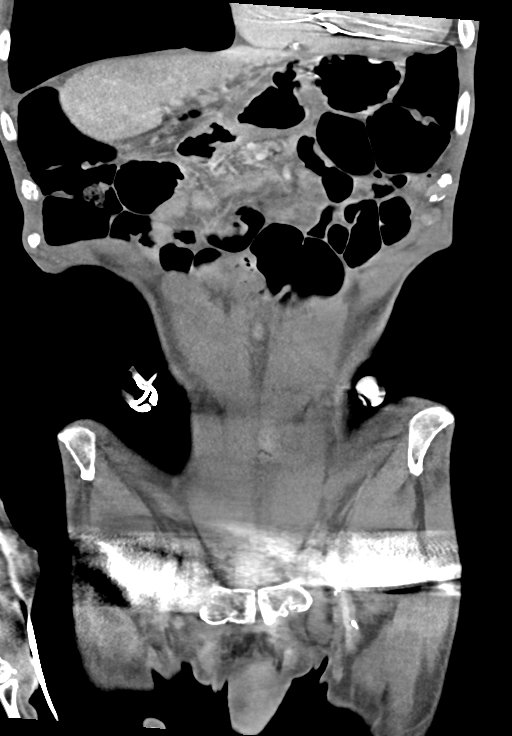
[im 77/115  soft-tissue]
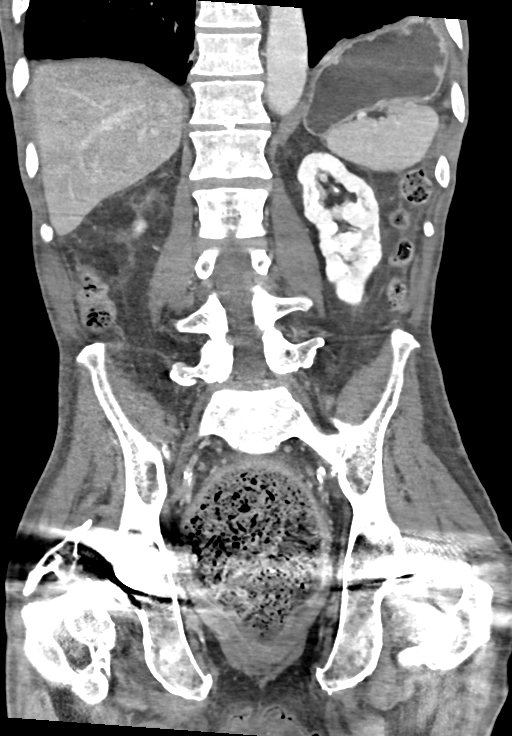

[15 of 46 positions shown; findings below may reference images not displayed]

FINDINGS: VASCULAR

Aorta: Normal caliber aorta without aneurysm, dissection, vasculitis
or significant stenosis. Scattered mild atherosclerosis of the
abdominal aorta.

Celiac: Patent without evidence of aneurysm, dissection, vasculitis
or significant stenosis.

SMA: Patent without evidence of aneurysm, dissection, vasculitis or
significant stenosis. No thrombosis noted.

Renals: Both renal arteries are patent though the right renal artery
segment within attenuated in appearance without focal stenosis. This
may account for the mild renal atrophy and cortical thinning of the
right kidney.

IMA: Patent without evidence of aneurysm, dissection, vasculitis or
significant stenosis.

Inflow: Atherosclerosis of the common iliac arteries without
aneurysm or significant stenosis.

Proximal Outflow: Bilateral common femoral and visualized portions
of the superficial and profunda femoral arteries are patent without
evidence of aneurysm, dissection, vasculitis or significant
stenosis.

Veins: No obvious venous abnormality within the limitations of this
arterial phase study.

Review of the MIP images confirms the above findings.

NON-VASCULAR

Lower chest: Calcified pleural plaque at the left lung base
posteriorly. Chronic mild elevation of the left hemidiaphragm.

Hepatobiliary: Colonic interposition over the liver. No
space-occupying mass of the liver. Gallbladder is not well
visualized but is free opaque calculi.

Pancreas: Atrophic pancreas without ductal dilatation or enhancing
mass.

Spleen: Normal in size without focal abnormality.

Adrenals/Urinary Tract: No adrenal mass. Cortical thinning and
scarring right worse than left with small stable cortical cysts too
small to further characterize. Urinary bladder is obscured by streak
artifacts from patient's bilateral hip arthroplasties.

Stomach/Bowel: Fecal impaction with a large 10 cm in diameter stool
ball in the rectum with mild-to-moderate inflammatory
circumferential rectal wall thickening consistent with stercoral
colitis. No bowel obstruction. The stomach is physiologically
distended. Normal appearing appendix.

Lymphatic: No lymphadenopathy.

Reproductive: Prostate is obscured by streak artifacts from the hip
arthroplasties.

Other: No abdominopelvic ascites.

Musculoskeletal: Mild dextroconvex curvature of the lumbar spine
with degenerative disc disease at L3-4. Bilateral intact hip
arthroplasties with postop heterotopic new bone formation about both
hips.
IMPRESSION: VASCULAR

1. Aortic atherosclerosis without aneurysm or dissection.
2. Patent branch vessels off the aorta with slight thinning the
right renal artery relative to left accounting for slightly smaller
appearing right kidney.

NON-VASCULAR

1. Large stool ball in the rectum with inflammatory thickening
consistent with stercoral colitis and fecal impaction.
2. No evidence of mesenteric ischemia.
3. Bilateral renal cortical scarring with small or attenuating
hypodensities consistent with cysts bilaterally.
4. Left lower lobe pleural plaque.

## 2018-01-22 IMAGING — US US SCROTUM
1 series · 14 of 25 positions shown · non-contrast
Comparison: 10/13/2014 CT

CLINICAL DATA: Left more than right scrotal swelling for more than
1 year.

EXAM:
ULTRASOUND OF SCROTUM
TECHNIQUE: Complete ultrasound examination of the testicles, epididymis, and
other scrotal structures was performed.

[Series 1: us scrotum · 0.08mm/px · 14 of 46 slices shown]
[im 1/46]
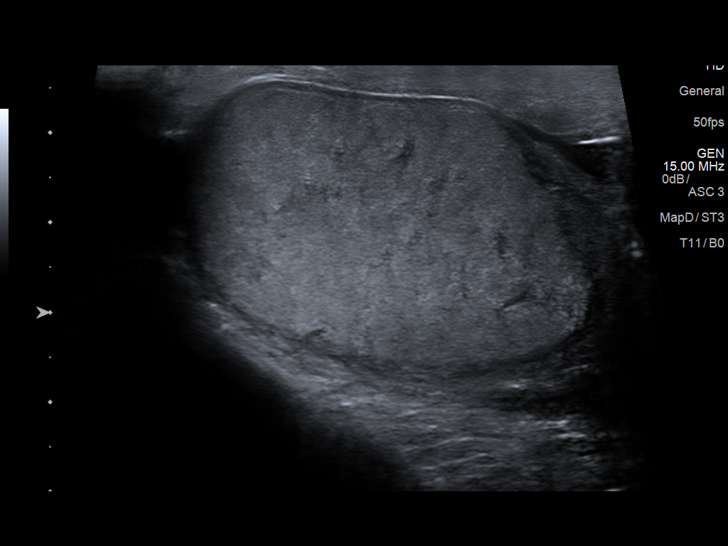
[im 4/46]
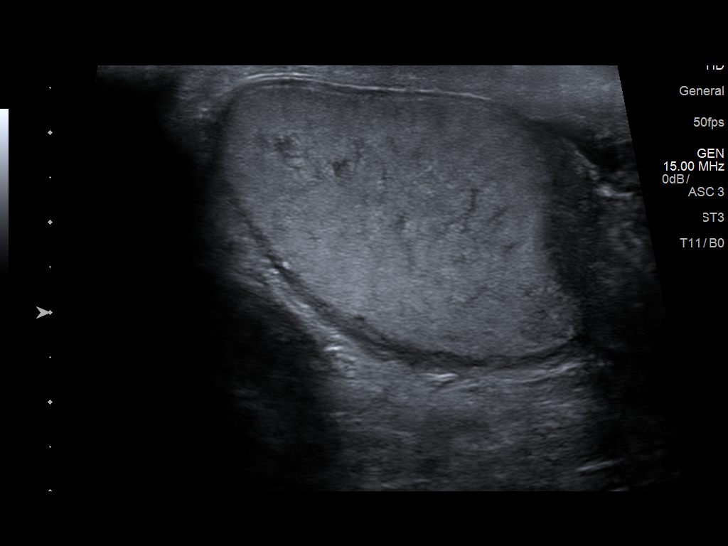
[im 8/46]
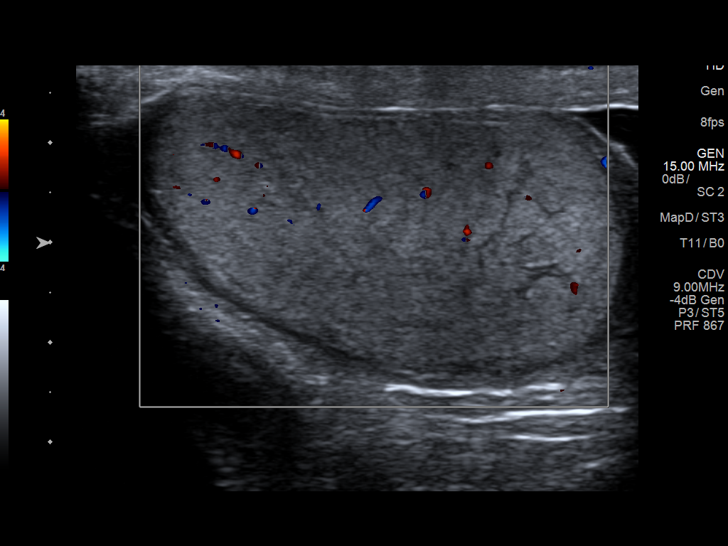
[im 12/46]
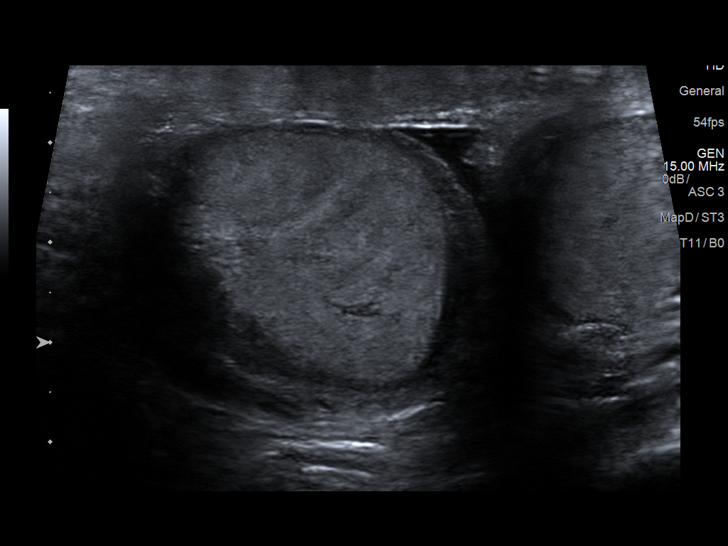
[im 16/46]
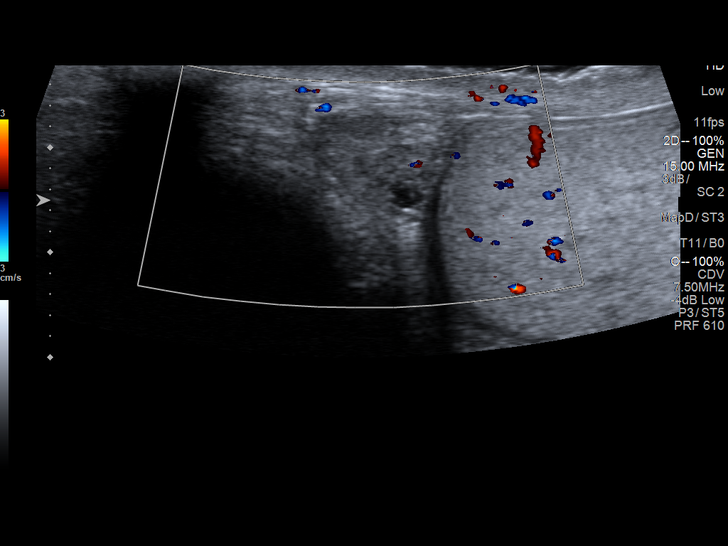
[im 17/46]
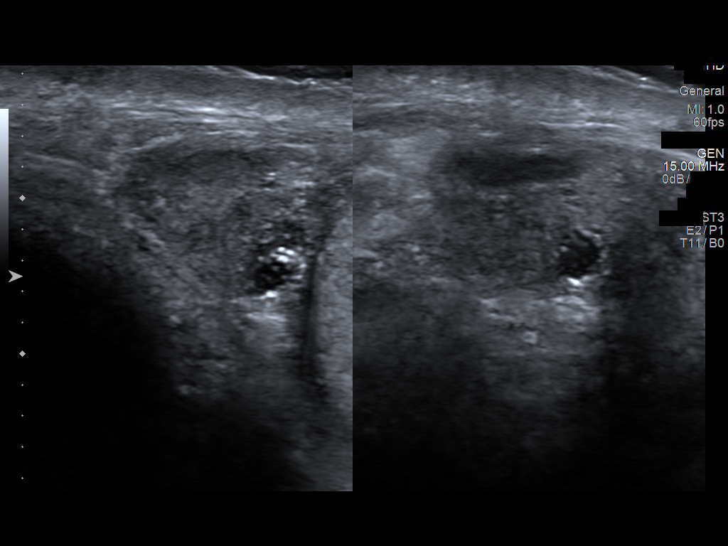
[im 21/46]
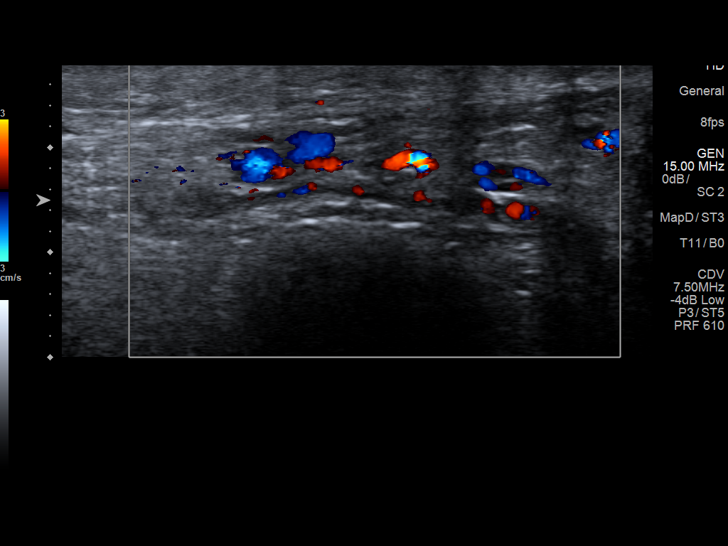
[im 25/46]
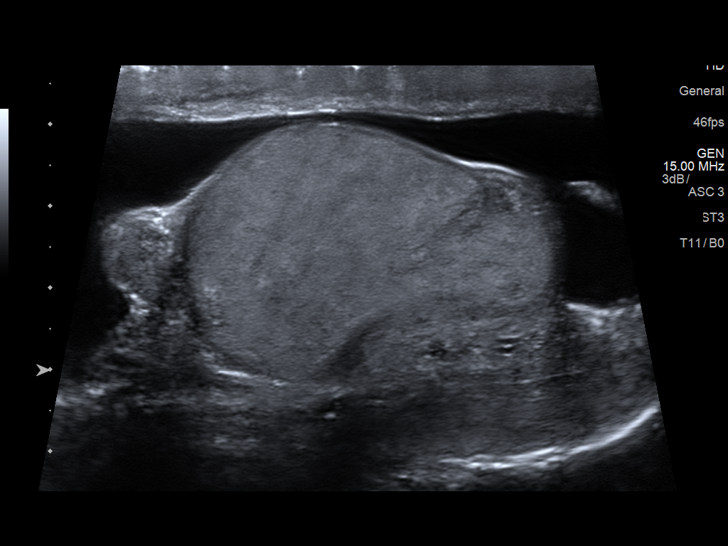
[im 29/46]
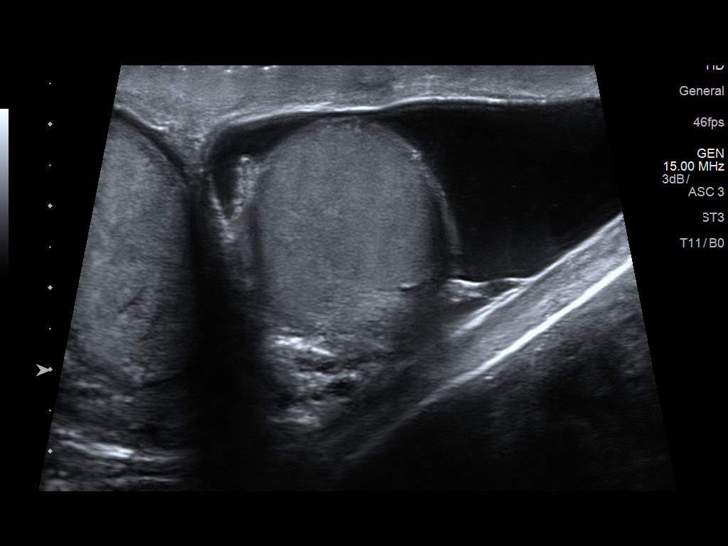
[im 31/46]
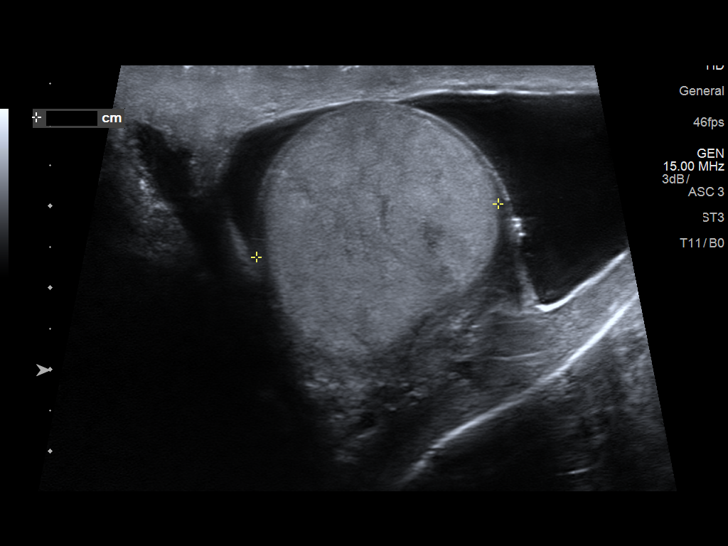
[im 34/46]
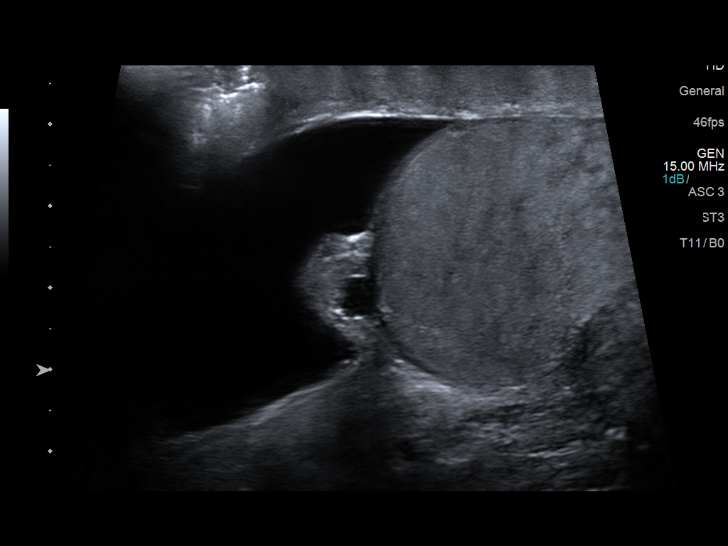
[im 38/46]
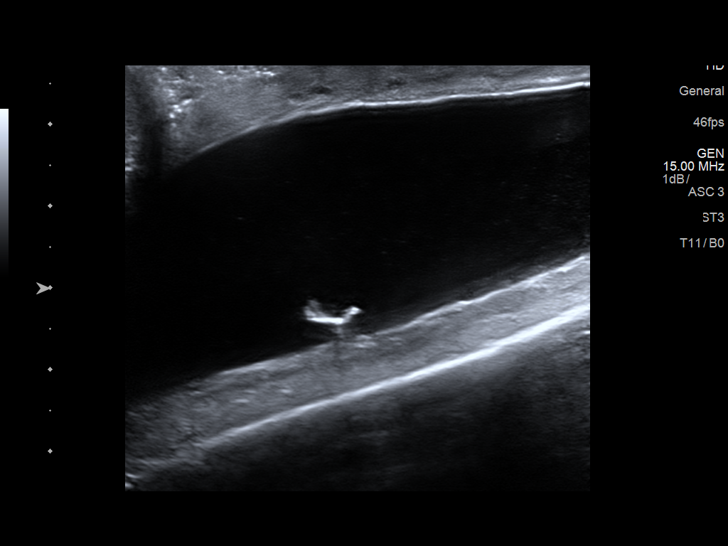
[im 42/46]
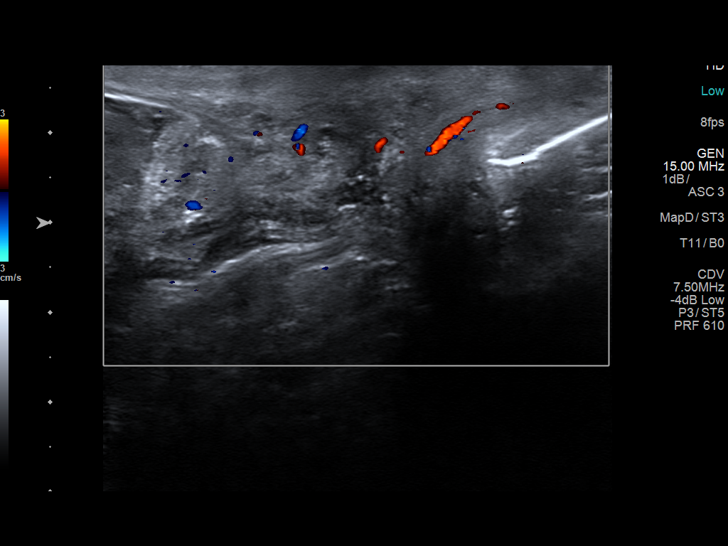
[im 46/46]
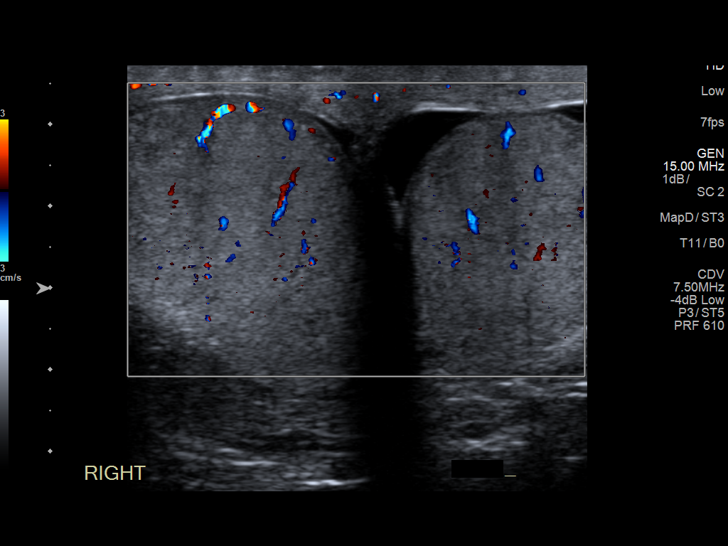

[14 of 25 positions shown; findings below may reference images not displayed]

FINDINGS: Right testicle

Measurements: 4.8 x 2.9 x 2.9 cm. Mildly heterogeneous without focal
mass. Color flow appears normal.

Left testicle

Measurements: 4.6 x 3.2 x 3.0 cm. Mildly heterogeneous without focal
mass. Color flow appears normal.

Right epididymis:  0.3 x 0.2 x 0.4 cm cyst present.

Left epididymis:  0.6 x 0.6 x 0.5 cm cyst present.

Hydrocele:  Left hydrocele present.

Varicocele:  None visualized.
IMPRESSION: 1. No evidence for testicular mass. Symmetric color flow in the
testicles bilaterally. Full Doppler evaluation not performed.
2. Left hydrocele likely accounting for the patient's symptoms of
scrotal swelling.
3. Small bilateral epididymal cyst/ spermatocele.

## 2018-01-22 NOTE — Telephone Encounter (Signed)
Rec'd call from [facility] (Sree)  FYI ONLY:   They have been out to his home on a couple of visits and he is not answering his door.  Patient is non-compliant and may be d/c from their services if he does not answer.

## 2018-01-23 NOTE — Telephone Encounter (Signed)
Thank you for this information. I will address at his next clinic visit.

## 2018-01-27 ENCOUNTER — Encounter: Payer: Self-pay | Admitting: Internal Medicine

## 2018-01-27 ENCOUNTER — Ambulatory Visit (INDEPENDENT_AMBULATORY_CARE_PROVIDER_SITE_OTHER): Payer: Medicare Other | Admitting: Internal Medicine

## 2018-01-27 ENCOUNTER — Other Ambulatory Visit: Payer: Self-pay

## 2018-01-27 VITALS — BP 126/92 | HR 102 | Temp 97.8°F | Ht 75.0 in | Wt 131.4 lb

## 2018-01-27 DIAGNOSIS — Z993 Dependence on wheelchair: Secondary | ICD-10-CM

## 2018-01-27 DIAGNOSIS — R634 Abnormal weight loss: Secondary | ICD-10-CM | POA: Diagnosis not present

## 2018-01-27 DIAGNOSIS — I130 Hypertensive heart and chronic kidney disease with heart failure and stage 1 through stage 4 chronic kidney disease, or unspecified chronic kidney disease: Secondary | ICD-10-CM | POA: Diagnosis not present

## 2018-01-27 DIAGNOSIS — Z7401 Bed confinement status: Secondary | ICD-10-CM

## 2018-01-27 DIAGNOSIS — R531 Weakness: Secondary | ICD-10-CM | POA: Diagnosis not present

## 2018-01-27 DIAGNOSIS — Z86718 Personal history of other venous thrombosis and embolism: Secondary | ICD-10-CM

## 2018-01-27 DIAGNOSIS — Z681 Body mass index (BMI) 19 or less, adult: Secondary | ICD-10-CM

## 2018-01-27 DIAGNOSIS — J449 Chronic obstructive pulmonary disease, unspecified: Secondary | ICD-10-CM

## 2018-01-27 DIAGNOSIS — F1010 Alcohol abuse, uncomplicated: Secondary | ICD-10-CM

## 2018-01-27 DIAGNOSIS — Z720 Tobacco use: Secondary | ICD-10-CM

## 2018-01-27 DIAGNOSIS — I252 Old myocardial infarction: Secondary | ICD-10-CM

## 2018-01-27 DIAGNOSIS — Z9119 Patient's noncompliance with other medical treatment and regimen: Secondary | ICD-10-CM

## 2018-01-27 DIAGNOSIS — N183 Chronic kidney disease, stage 3 (moderate): Secondary | ICD-10-CM

## 2018-01-27 DIAGNOSIS — I4891 Unspecified atrial fibrillation: Secondary | ICD-10-CM

## 2018-01-27 DIAGNOSIS — E785 Hyperlipidemia, unspecified: Secondary | ICD-10-CM

## 2018-01-27 DIAGNOSIS — I5042 Chronic combined systolic (congestive) and diastolic (congestive) heart failure: Secondary | ICD-10-CM | POA: Diagnosis not present

## 2018-01-27 DIAGNOSIS — I083 Combined rheumatic disorders of mitral, aortic and tricuspid valves: Secondary | ICD-10-CM

## 2018-01-27 NOTE — Patient Instructions (Signed)
I will call you with the results of your blood work.  

## 2018-01-27 NOTE — Progress Notes (Signed)
CC: Follow-up of weakness  HPI:  [name] is a 72 y.o. with a history of alcohol abuse, Afib with RVR, CHF with systolic dysfunction (Last Echo 07/2015 EF 30%), Stage III CKD, COPD, DVT, HLD, HTN, MI, who presents for follow-up of weakness.  [name] was seen on 12/17/17 for worsening generalized weakness which has led him to be wheel-chair bound. It was thought that his weakness was multifactorial in nature (peripheral vascular disease, peripheral neuropathy, and deconditioning). He was referred to home health to help with activities of daily living. Since then he has had issues with both home health and PT. According to several phone calls received over the last month, [name] has been non-compliant with these services (not coming to the front door when PT arrives). He reports that he has not had issues with home health but has had problems with PT stating "he always wants it on his own time."  [name] reports his weakness has been slowly progressing. He is now bed bound at home. He states that when he attempts to get up he experiences extreme SOB. He reports a decrease in appetite over the last several years. He denies chest pain, nausea, vomiting, diarrhea. He has decreased his tobacco smoking to 3-4 cigarettes per day (down from 1/22 ppd).   Has an appointment with vascular surgeon on 02/10/18.     Past Medical History:  Diagnosis Date  . Alcohol abuse   . Anxiety   . Asbestos exposure   . Atrial fibrillation with RVR ([place]) 01/17/2014  . Avascular necrosis of bones of both hips (HCC)    Status post bilateral hip replacements  . Bipolar disorder ([place])   . Chronic systolic heart failure ([place])    a. NICM - EF 35% with normal cors 2003. b. Last echo 11/2012 - EF 25-30%.  . CKD (chronic kidney disease), stage III ([place])    Renal U/S 12/04/2009 showed no pathological findings. Labs 12/04/2009 include normal ESR, C3, C4; neg ANA; SPEP showed nonspecific increase in the alpha-2  region with no M-spike; UPEP showed no monoclonal free light chains; urine IFE showed polyclonal increase in feree Kappa and/or free Lambda light chains. Baseline Cr reported 1.7-2.5.  [name] COPD (chronic obstructive pulmonary disease) ([place])   . Depression   . DVT (deep venous thrombosis) ([place])    He has hypercoagulability with multiple prior DVTs  . E coli bacteremia   . Erectile dysfunction   . Failure to thrive (0-17) 08/2016   DEHYDRATION  . Gastritis   . GERD (gastroesophageal reflux disease)   . HCAP (healthcare-associated pneumonia) 08/24/2015  . Hydrocele, unspecified   . Hyperlipemia   . Hypertension   . 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  Thyroid scan 08/28/2009 was normal with no focal areas of abnormal increased or decreased activity seen; the uptake of I 131 sodium iodide at 24 hours was 5.7%.  TSH and free T4 normalized by 08/16/2009.  . Intestinal obstruction ([place])   . Lung nodule    Chest CT scan on 12/14/2008 showed a nodular opacity at the left lung base felt to most likely represent scarring.  Follow-up chest CT scan on 06/02/2010 showed parenchymal scarring in the left apex, left  lower lobe, and lingula; some of this scarring at the left base had a nodular appearance, unchanged. Chest 09/2012 - stable.  [name] NICM (nonischemic cardiomyopathy) ([place])   . Noncompliance   . On home oxygen therapy    "  3L periodically" (08/24/2015)  . Osteoarthritis   . Permanent atrial fibrillation ([place])    a. failed ablation in 2010 due to inability to access/venous occlusions. b. failed multaq, amiodarone, tikosyn. c. has not been felt to be a good candidate for AVN ablation due to recurrent infections/bacteremia history.  . Pleural plaque    H/o asbestos exposure. Chest CT on 06/02/2010 showed stable extensive calcified pleural plaques involving the left hemithorax, consistent with asbestos related pleural disease.  . Portal vein thrombosis   . Protein  calorie malnutrition ([place]) 04/2016  . Spondylosis   . STEMI (ST elevation myocardial infarction) ([place]) 71/21/9758   a. felt embolic in nature due to noncompliance with anticoagulation, LHC not pursued.  . 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 b. St. Jude gen change 2010.  [name] Thrombocytopenia ([place])   . 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  . Weight loss    Normal colonoscopy by Dr. Olevia Perches on 11/06/2010.   Review of Systems:   Constitutional: Positive for decreased appetite and weight loss. Negative for chills, fever and malaise/fatigue.  Respiratory: Positive for cough, sputum production, and SOB. Cardiovascular: Negative for chest pain, palpitations, claudication and leg swelling.  Gastrointestinal: Negative for abdominal pain, constipation, diarrhea, nausea and vomiting.  Musculoskeletal:       LE pain bilaterally  Skin: Negative for rash.  Neurological: Positive for numbness to soles of feet bilaterally. Negative for dizziness, focal weakness and headaches.  All other systems reviewed and are negative.  Physical Exam:  Vitals:   01/27/18 1530  BP: (!) 126/92  Pulse: (!) 102  Temp: 97.8 F (36.6 C)  TempSrc: Oral  SpO2: 100%  Weight: 131 lb 6.4 oz (59.6 kg)  Height: '6\' 3"'  (1.905 m)   Constitutional: He is oriented to person, place, and time.  Cachectic appearing male, sitting up in wheel-chair, in no acute distress  HENT:  Head: Normocephalic and atraumatic.  Eyes: EOM are normal.  Neck: Normal range of motion.  Cardiovascular: Tachycardic 100-110's and irregular rhythm.  Pulmonary/Chest: Effort normal. No respiratory distress.  Abdominal: Soft. Bowel sounds are normal. He exhibits no distension. There is no tenderness.  Musculoskeletal: Normal range of motion.  Neurological: He is alert. He has normal reflexes. He has  decreased strength in LE distally (ankles) bilaterally. Normal strength in proximal LE bilaterally. Normal strength in UE bilaterally. Decreased sensation to sole of the feet bilaterally. He has abnormal muscle tone in all 4 extremities.  Skin: Skin is warm and dry.  Psychiatric: Affect and judgment normal.   Assessment & Plan:   See Encounters Tab for problem based charting.  Patient seen with Dr. Rebeca Alert

## 2018-01-28 ENCOUNTER — Telehealth: Payer: Self-pay | Admitting: *Deleted

## 2018-01-28 LAB — BMP8+ANION GAP
Anion Gap: 18 mmol/L (ref 10.0–18.0)
BUN / CREAT RATIO: 10 (ref 10–24)
BUN: 13 mg/dL (ref 8–27)
CHLORIDE: 109 mmol/L — AB (ref 96–106)
CO2: 18 mmol/L — AB (ref 20–29)
Calcium: 9 mg/dL (ref 8.6–10.2)
Creatinine, Ser: 1.28 mg/dL — ABNORMAL HIGH (ref 0.76–1.27)
GFR calc non Af Amer: 55 mL/min/{1.73_m2} — ABNORMAL LOW (ref >59)
GFR, EST AFRICAN AMERICAN: 63 mL/min/{1.73_m2} (ref >59)
GLUCOSE: 95 mg/dL (ref 65–99)
POTASSIUM: 3.8 mmol/L (ref 3.5–5.2)
SODIUM: 145 mmol/L — AB (ref 134–144)

## 2018-01-28 LAB — CBC WITH DIFFERENTIAL/PLATELET
Basophils Absolute: 0 10*3/uL (ref 0.0–0.2)
Basos: 0 %
EOS (ABSOLUTE): 0.7 10*3/uL — AB (ref 0.0–0.4)
EOS: 9 %
Hematocrit: 44.4 % (ref 37.5–51.0)
Hemoglobin: 14.9 g/dL (ref 13.0–17.7)
IMMATURE GRANS (ABS): 0 10*3/uL (ref 0.0–0.1)
IMMATURE GRANULOCYTES: 0 %
Lymphocytes Absolute: 2.4 10*3/uL (ref 0.7–3.1)
Lymphs: 31 %
MCH: 30.4 pg (ref 26.6–33.0)
MCHC: 33.6 g/dL (ref 31.5–35.7)
MCV: 91 fL (ref 79–97)
Monocytes Absolute: 0.6 10*3/uL (ref 0.1–0.9)
Monocytes: 8 %
Neutrophils Absolute: 4 10*3/uL (ref 1.4–7.0)
Neutrophils: 52 %
PLATELETS: 178 10*3/uL (ref 150–450)
RBC: 4.9 x10E6/uL (ref 4.14–5.80)
RDW: 16.3 % — ABNORMAL HIGH (ref 12.3–15.4)
WBC: 7.7 10*3/uL (ref 3.4–10.8)

## 2018-01-28 LAB — TSH: TSH: 4.76 u[IU]/mL — ABNORMAL HIGH (ref 0.450–4.500)

## 2018-01-28 LAB — HIV ANTIBODY (ROUTINE TESTING W REFLEX): HIV Screen 4th Generation wRfx: NONREACTIVE

## 2018-01-28 LAB — CK: CK TOTAL: 40 U/L (ref 24–204)

## 2018-01-28 LAB — RPR: RPR Ser Ql: NONREACTIVE

## 2018-01-28 NOTE — Telephone Encounter (Signed)
Thank you, agree he should be evaluated.

## 2018-01-28 NOTE — Telephone Encounter (Signed)
Call from Sree,PT at [facility] - stated pt had a fall this am around 1130 AM. Pt c/o left lower rib pain. He offered to call 911 but pt refused; told pt he needed to go to the ER or doctor's office. Pt refused ; stated he wanted to wait on his daughter. I was able to talk to pt - he told me the same thing; stated his daughter gets off of work around 1630 PM and then he will have her to bring him to the ER.Told Sree I will f/u to see if he comes to the hospital - stated he's concern about the rib pain.

## 2018-01-29 ENCOUNTER — Other Ambulatory Visit: Payer: Self-pay | Admitting: Internal Medicine

## 2018-01-29 ENCOUNTER — Encounter: Payer: Self-pay | Admitting: Internal Medicine

## 2018-01-29 NOTE — Assessment & Plan Note (Signed)
Assessment: Last Echo on 08/14/2015: EF 30% with diffuse hypokinesis. Aortic, mitral, and tricuspid valve regurgitation. Order echo to evaluate heart function in the setting of worsening weakness and shortness of breath.  Plan: 1. Echo ordered

## 2018-01-29 NOTE — Telephone Encounter (Signed)
Called pt - no answer; left message "f/u call" from his fall yesterday.

## 2018-01-29 NOTE — Telephone Encounter (Signed)
Thank you :)

## 2018-01-29 NOTE — Assessment & Plan Note (Signed)
Assessment: [name] continues to have progressive generalized weakness. Differential diagnosis includes metabolic abnormality, anemia, worsening of heart failure, hypothyroidism, infectious diseases such as HIV and syphilis, and inflammatory myopathies.  Plan: 1. Ordered BMP, CBC, CK, HIV, RPR, TSH 2. Ordered Echo to evaluate heart function

## 2018-01-30 ENCOUNTER — Telehealth: Payer: Self-pay | Admitting: Internal Medicine

## 2018-01-30 NOTE — Telephone Encounter (Signed)
TSH was found to be elevated at 4.760. We will plan on measuring free T4 at his follow-up visit to distinguish between subclinical hypothyroidism (elevated TSH but normal fT4) and hypothyroidism (elevated TSH and low fT4). I attempted to call Mr. Sample to notify him of these results but have not reached him. I will re-attempt at a later time.

## 2018-02-02 ENCOUNTER — Inpatient Hospital Stay (HOSPITAL_COMMUNITY): Admission: RE | Admit: 2018-02-02 | Payer: Medicare Other | Source: Ambulatory Visit

## 2018-02-02 NOTE — Progress Notes (Signed)
Internal Medicine Clinic Attending  I saw and evaluated the patient.  I personally confirmed the key portions of the history and exam documented by Dr. Prince and I reviewed pertinent patient test results.  The assessment, diagnosis, and plan were formulated together and I agree with the documentation in the resident's note.  Alexander Raines, M.D., Ph.D.  

## 2018-02-05 ENCOUNTER — Inpatient Hospital Stay (HOSPITAL_COMMUNITY): Admission: RE | Admit: 2018-02-05 | Payer: Medicare Other | Source: Ambulatory Visit

## 2018-02-06 ENCOUNTER — Telehealth: Payer: Self-pay | Admitting: Internal Medicine

## 2018-02-06 NOTE — Telephone Encounter (Signed)
Free from [facility] missed today, per Free he looking to be discharge. Pls call [phone]

## 2018-02-09 NOTE — Telephone Encounter (Signed)
Returned call to [place], [place] with [facility]. No answer. Left message on VM requesting return call. Hubbard Hartshorn, RN, BSN

## 2018-02-10 ENCOUNTER — Other Ambulatory Visit: Payer: Self-pay | Admitting: *Deleted

## 2018-02-10 ENCOUNTER — Encounter (HOSPITAL_COMMUNITY): Payer: Medicare Other

## 2018-02-10 ENCOUNTER — Encounter: Payer: Medicare Other | Admitting: Vascular Surgery

## 2018-02-10 DIAGNOSIS — Z79891 Long term (current) use of opiate analgesic: Secondary | ICD-10-CM

## 2018-02-10 MED ORDER — OXYCODONE-ACETAMINOPHEN 10-325 MG PO TABS: 1.0000 | ORAL_TABLET | ORAL | 0 refills | Status: AC | PRN

## 2018-02-10 NOTE — Addendum Note (Signed)
Addended by: Hulan Fray on: 02/10/2018 04:30 PM   Modules accepted: Orders

## 2018-02-10 NOTE — Telephone Encounter (Signed)
Reviewed chart.  Unsure of why his # of pills were increased to 180 at last fill?  Not clearly documented, but I might be missing it.  [place] narcotic database was okay and refill sent.

## 2018-02-11 ENCOUNTER — Other Ambulatory Visit (HOSPITAL_COMMUNITY): Payer: Medicare Other

## 2018-02-11 ENCOUNTER — Inpatient Hospital Stay (HOSPITAL_COMMUNITY): Admission: RE | Admit: 2018-02-11 | Payer: Medicare Other | Source: Ambulatory Visit

## 2018-02-12 ENCOUNTER — Encounter (HOSPITAL_COMMUNITY): Payer: Self-pay | Admitting: Emergency Medicine

## 2018-02-12 ENCOUNTER — Other Ambulatory Visit: Payer: Self-pay

## 2018-02-12 ENCOUNTER — Inpatient Hospital Stay (HOSPITAL_COMMUNITY)
Admission: EM | Admit: 2018-02-12 | Discharge: 2018-02-17 | DRG: 308 | Disposition: E | Payer: Medicare Other | Attending: Internal Medicine | Admitting: Internal Medicine

## 2018-02-12 DIAGNOSIS — I700 Atherosclerosis of aorta: Secondary | ICD-10-CM | POA: Diagnosis present

## 2018-02-12 DIAGNOSIS — Z79891 Long term (current) use of opiate analgesic: Secondary | ICD-10-CM

## 2018-02-12 DIAGNOSIS — J9611 Chronic respiratory failure with hypoxia: Secondary | ICD-10-CM | POA: Diagnosis present

## 2018-02-12 DIAGNOSIS — Z66 Do not resuscitate: Secondary | ICD-10-CM | POA: Diagnosis present

## 2018-02-12 DIAGNOSIS — K219 Gastro-esophageal reflux disease without esophagitis: Secondary | ICD-10-CM | POA: Diagnosis present

## 2018-02-12 DIAGNOSIS — Z681 Body mass index (BMI) 19 or less, adult: Secondary | ICD-10-CM

## 2018-02-12 DIAGNOSIS — R64 Cachexia: Secondary | ICD-10-CM | POA: Diagnosis present

## 2018-02-12 DIAGNOSIS — I252 Old myocardial infarction: Secondary | ICD-10-CM

## 2018-02-12 DIAGNOSIS — D696 Thrombocytopenia, unspecified: Secondary | ICD-10-CM | POA: Diagnosis present

## 2018-02-12 DIAGNOSIS — K920 Hematemesis: Secondary | ICD-10-CM

## 2018-02-12 DIAGNOSIS — Z823 Family history of stroke: Secondary | ICD-10-CM

## 2018-02-12 DIAGNOSIS — G629 Polyneuropathy, unspecified: Secondary | ICD-10-CM | POA: Diagnosis present

## 2018-02-12 DIAGNOSIS — K559 Vascular disorder of intestine, unspecified: Secondary | ICD-10-CM | POA: Diagnosis present

## 2018-02-12 DIAGNOSIS — Z888 Allergy status to other drugs, medicaments and biological substances status: Secondary | ICD-10-CM

## 2018-02-12 DIAGNOSIS — R29706 NIHSS score 6: Secondary | ICD-10-CM | POA: Diagnosis not present

## 2018-02-12 DIAGNOSIS — I482 Chronic atrial fibrillation: Principal | ICD-10-CM | POA: Diagnosis present

## 2018-02-12 DIAGNOSIS — Z880 Allergy status to penicillin: Secondary | ICD-10-CM

## 2018-02-12 DIAGNOSIS — Z8249 Family history of ischemic heart disease and other diseases of the circulatory system: Secondary | ICD-10-CM

## 2018-02-12 DIAGNOSIS — R112 Nausea with vomiting, unspecified: Secondary | ICD-10-CM

## 2018-02-12 DIAGNOSIS — R000 Tachycardia, unspecified: Secondary | ICD-10-CM | POA: Diagnosis not present

## 2018-02-12 DIAGNOSIS — E0590 Thyrotoxicosis, unspecified without thyrotoxic crisis or storm: Secondary | ICD-10-CM | POA: Diagnosis present

## 2018-02-12 DIAGNOSIS — G92 Toxic encephalopathy: Secondary | ICD-10-CM | POA: Diagnosis present

## 2018-02-12 DIAGNOSIS — Z7982 Long term (current) use of aspirin: Secondary | ICD-10-CM

## 2018-02-12 DIAGNOSIS — Z993 Dependence on wheelchair: Secondary | ICD-10-CM

## 2018-02-12 DIAGNOSIS — I878 Other specified disorders of veins: Secondary | ICD-10-CM | POA: Diagnosis present

## 2018-02-12 DIAGNOSIS — I428 Other cardiomyopathies: Secondary | ICD-10-CM | POA: Diagnosis present

## 2018-02-12 DIAGNOSIS — Y92230 Patient room in hospital as the place of occurrence of the external cause: Secondary | ICD-10-CM | POA: Diagnosis not present

## 2018-02-12 DIAGNOSIS — X58XXXA Exposure to other specified factors, initial encounter: Secondary | ICD-10-CM | POA: Diagnosis present

## 2018-02-12 DIAGNOSIS — E785 Hyperlipidemia, unspecified: Secondary | ICD-10-CM | POA: Diagnosis present

## 2018-02-12 DIAGNOSIS — Z825 Family history of asthma and other chronic lower respiratory diseases: Secondary | ICD-10-CM

## 2018-02-12 DIAGNOSIS — J61 Pneumoconiosis due to asbestos and other mineral fibers: Secondary | ICD-10-CM | POA: Diagnosis present

## 2018-02-12 DIAGNOSIS — L299 Pruritus, unspecified: Secondary | ICD-10-CM | POA: Diagnosis present

## 2018-02-12 DIAGNOSIS — Z7901 Long term (current) use of anticoagulants: Secondary | ICD-10-CM

## 2018-02-12 DIAGNOSIS — D72829 Elevated white blood cell count, unspecified: Secondary | ICD-10-CM | POA: Diagnosis present

## 2018-02-12 DIAGNOSIS — R627 Adult failure to thrive: Secondary | ICD-10-CM | POA: Diagnosis present

## 2018-02-12 DIAGNOSIS — F419 Anxiety disorder, unspecified: Secondary | ICD-10-CM | POA: Diagnosis present

## 2018-02-12 DIAGNOSIS — R079 Chest pain, unspecified: Secondary | ICD-10-CM

## 2018-02-12 DIAGNOSIS — N183 Chronic kidney disease, stage 3 (moderate): Secondary | ICD-10-CM | POA: Diagnosis present

## 2018-02-12 DIAGNOSIS — I495 Sick sinus syndrome: Secondary | ICD-10-CM | POA: Diagnosis present

## 2018-02-12 DIAGNOSIS — R52 Pain, unspecified: Secondary | ICD-10-CM

## 2018-02-12 DIAGNOSIS — T402X5A Adverse effect of other opioids, initial encounter: Secondary | ICD-10-CM | POA: Diagnosis present

## 2018-02-12 DIAGNOSIS — M419 Scoliosis, unspecified: Secondary | ICD-10-CM | POA: Diagnosis present

## 2018-02-12 DIAGNOSIS — K5939 Other megacolon: Secondary | ICD-10-CM | POA: Diagnosis present

## 2018-02-12 DIAGNOSIS — Z96643 Presence of artificial hip joint, bilateral: Secondary | ICD-10-CM | POA: Diagnosis present

## 2018-02-12 DIAGNOSIS — I5022 Chronic systolic (congestive) heart failure: Secondary | ICD-10-CM | POA: Diagnosis present

## 2018-02-12 DIAGNOSIS — Z515 Encounter for palliative care: Secondary | ICD-10-CM | POA: Diagnosis not present

## 2018-02-12 DIAGNOSIS — Z86718 Personal history of other venous thrombosis and embolism: Secondary | ICD-10-CM

## 2018-02-12 DIAGNOSIS — I4891 Unspecified atrial fibrillation: Secondary | ICD-10-CM

## 2018-02-12 DIAGNOSIS — E43 Unspecified severe protein-calorie malnutrition: Secondary | ICD-10-CM | POA: Diagnosis present

## 2018-02-12 DIAGNOSIS — F17210 Nicotine dependence, cigarettes, uncomplicated: Secondary | ICD-10-CM | POA: Diagnosis present

## 2018-02-12 DIAGNOSIS — Z77090 Contact with and (suspected) exposure to asbestos: Secondary | ICD-10-CM | POA: Diagnosis present

## 2018-02-12 DIAGNOSIS — K5903 Drug induced constipation: Secondary | ICD-10-CM | POA: Diagnosis present

## 2018-02-12 DIAGNOSIS — R140 Abdominal distension (gaseous): Secondary | ICD-10-CM

## 2018-02-12 DIAGNOSIS — Z79890 Hormone replacement therapy (postmenopausal): Secondary | ICD-10-CM

## 2018-02-12 DIAGNOSIS — J449 Chronic obstructive pulmonary disease, unspecified: Secondary | ICD-10-CM | POA: Diagnosis present

## 2018-02-12 DIAGNOSIS — J189 Pneumonia, unspecified organism: Secondary | ICD-10-CM | POA: Diagnosis present

## 2018-02-12 DIAGNOSIS — Z950 Presence of cardiac pacemaker: Secondary | ICD-10-CM

## 2018-02-12 DIAGNOSIS — Z9981 Dependence on supplemental oxygen: Secondary | ICD-10-CM

## 2018-02-12 DIAGNOSIS — F329 Major depressive disorder, single episode, unspecified: Secondary | ICD-10-CM | POA: Diagnosis present

## 2018-02-12 DIAGNOSIS — E872 Acidosis: Secondary | ICD-10-CM | POA: Diagnosis not present

## 2018-02-12 DIAGNOSIS — Z79899 Other long term (current) drug therapy: Secondary | ICD-10-CM

## 2018-02-12 DIAGNOSIS — N179 Acute kidney failure, unspecified: Secondary | ICD-10-CM | POA: Diagnosis present

## 2018-02-12 DIAGNOSIS — Z809 Family history of malignant neoplasm, unspecified: Secondary | ICD-10-CM

## 2018-02-12 DIAGNOSIS — I130 Hypertensive heart and chronic kidney disease with heart failure and stage 1 through stage 4 chronic kidney disease, or unspecified chronic kidney disease: Secondary | ICD-10-CM | POA: Diagnosis present

## 2018-02-12 DIAGNOSIS — R531 Weakness: Secondary | ICD-10-CM

## 2018-02-12 DIAGNOSIS — Z91048 Other nonmedicinal substance allergy status: Secondary | ICD-10-CM

## 2018-02-12 DIAGNOSIS — Z91040 Latex allergy status: Secondary | ICD-10-CM

## 2018-02-12 LAB — CBC
HEMATOCRIT: 53.6 % — AB (ref 39.0–52.0)
HEMOGLOBIN: 16.5 g/dL (ref 13.0–17.0)
MCH: 29.9 pg (ref 26.0–34.0)
MCHC: 30.8 g/dL (ref 30.0–36.0)
MCV: 97.3 fL (ref 78.0–100.0)
Platelets: 195 10*3/uL (ref 150–400)
RBC: 5.51 MIL/uL (ref 4.22–5.81)
RDW: 14.6 % (ref 11.5–15.5)
WBC: 13.2 10*3/uL — ABNORMAL HIGH (ref 4.0–10.5)

## 2018-02-12 LAB — BASIC METABOLIC PANEL
ANION GAP: 16 — AB (ref 5–15)
BUN: 21 mg/dL (ref 8–23)
CALCIUM: 9.1 mg/dL (ref 8.9–10.3)
CHLORIDE: 107 mmol/L (ref 98–111)
CO2: 22 mmol/L (ref 22–32)
Creatinine, Ser: 1.58 mg/dL — ABNORMAL HIGH (ref 0.61–1.24)
GFR calc Af Amer: 48 mL/min — ABNORMAL LOW (ref >60)
GFR calc non Af Amer: 41 mL/min — ABNORMAL LOW (ref >60)
Glucose, Bld: 150 mg/dL — ABNORMAL HIGH (ref 70–99)
POTASSIUM: 3.7 mmol/L (ref 3.5–5.1)
Sodium: 145 mmol/L (ref 135–145)

## 2018-02-12 LAB — I-STAT TROPONIN, ED: TROPONIN I, POC: 0.01 ng/mL (ref 0.00–0.08)

## 2018-02-12 MED ORDER — ALBUTEROL (5 MG/ML) CONTINUOUS INHALATION SOLN
10.0000 mg/h | INHALATION_SOLUTION | RESPIRATORY_TRACT | Status: DC
  Filled 2018-02-12: qty 20

## 2018-02-12 MED ORDER — METHYLPREDNISOLONE SODIUM SUCC 125 MG IJ SOLR
125.0000 mg | Freq: Once | INTRAMUSCULAR | Status: AC
  Administered 2018-02-13: 125 mg via INTRAVENOUS
  Filled 2018-02-12: qty 2

## 2018-02-12 MED ORDER — DILTIAZEM HCL-DEXTROSE 100-5 MG/100ML-% IV SOLN (PREMIX)
5.0000 mg/h | INTRAVENOUS | Status: AC
  Administered 2018-02-13 (×2): 5 mg/h via INTRAVENOUS
  Filled 2018-02-12 (×2): qty 100

## 2018-02-12 MED ORDER — DILTIAZEM LOAD VIA INFUSION
10.0000 mg | Freq: Once | INTRAVENOUS | Status: AC
  Administered 2018-02-13: 10 mg via INTRAVENOUS
  Filled 2018-02-12: qty 10

## 2018-02-12 NOTE — ED Provider Notes (Signed)
[facility] EMERGENCY DEPARTMENT Provider Note   CSN: [id number] Arrival date & time: 01/23/2018  2304     History   Chief Complaint Chief Complaint  Patient presents with  . Respiratory Distress    HPI [name] is a 72 y.o. male.  The history is provided by the patient and medical records.    72 y.o. M with hx of alcohol abuse, anxiety, asbestos exposure, AFIB, bipolar disorder, CHF, CKD, COPD, depression, hx of DVT on xarelto, ED, GERD, hyperthyroidism, weight loss and malnutrition, presenting to the ED with multiple concerns.  Patient reports notably he called EMS tonight due to difficulty breathing.  States this initially started 2 days ago but got worse tonight.  He reports cough with white phlegm, no noted fever or chills.  States he has been having some abdominal pain as well.  Reports he feels like he needs to have a BM but has not been able to go.  States appetite is poor but that is nothing new.  Reports he has lost approx 100 lbs over the past 2 years or so-- just can't seem to keep weight on him.  Has had some vomiting, no diarrhea.  Is able to pass gas.  Denies prior abdominal surgeries.  Lastly, patient reports generalized itching, worse on his back.  States this started after he was placed on EMS stretcher.  Denies new soaps/detergents or new medications.  Denies known bug or tick bites.  Past Medical History:  Diagnosis Date  . Alcohol abuse   . Anxiety   . Asbestos exposure   . Atrial fibrillation with RVR ([place]) 01/17/2014  . Avascular necrosis of bones of both hips (HCC)    Status post bilateral hip replacements  . Bipolar disorder ([place])   . Chronic systolic heart failure ([place])    a. NICM - EF 35% with normal cors 2003. b. Last echo 11/2012 - EF 25-30%.  . CKD (chronic kidney disease), stage III ([place])    Renal U/S 12/04/2009 showed no pathological findings. Labs 12/04/2009 include normal ESR, C3, C4; neg ANA; SPEP showed nonspecific increase in  the alpha-2 region with no M-spike; UPEP showed no monoclonal free light chains; urine IFE showed polyclonal increase in feree Kappa and/or free Lambda light chains. Baseline Cr reported 1.7-2.5.  [name] COPD (chronic obstructive pulmonary disease) ([place])   . Depression   . DVT (deep venous thrombosis) ([place])    He has hypercoagulability with multiple prior DVTs  . E coli bacteremia   . Erectile dysfunction   . Failure to thrive (0-17) 08/2016   DEHYDRATION  . Gastritis   . GERD (gastroesophageal reflux disease)   . HCAP (healthcare-associated pneumonia) 08/24/2015  . Hydrocele, unspecified   . Hyperlipemia   . Hypertension   . 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  Thyroid scan 08/28/2009 was normal with no focal areas of abnormal increased or decreased activity seen; the uptake of I 131 sodium iodide at 24 hours was 5.7%.  TSH and free T4 normalized by 08/16/2009.  . Intestinal obstruction ([place])   . Lung nodule    Chest CT scan on 12/14/2008 showed a nodular opacity at the left lung base felt to most likely represent scarring.  Follow-up chest CT scan on 06/02/2010 showed parenchymal scarring in the left apex, left  lower lobe, and lingula; some of this scarring at the left base had a nodular appearance, unchanged. Chest 09/2012 - stable.  [name] NICM (  nonischemic cardiomyopathy) ([place])   . Noncompliance   . On home oxygen therapy    "3L periodically" (08/24/2015)  . Osteoarthritis   . Permanent atrial fibrillation ([place])    a. failed ablation in 2010 due to inability to access/venous occlusions. b. failed multaq, amiodarone, tikosyn. c. has not been felt to be a good candidate for AVN ablation due to recurrent infections/bacteremia history.  . Pleural plaque    H/o asbestos exposure. Chest CT on 06/02/2010 showed stable extensive calcified pleural plaques involving the left hemithorax, consistent with asbestos related pleural disease.  . Portal vein thrombosis    . Protein calorie malnutrition ([place]) 04/2016  . Spondylosis   . STEMI (ST elevation myocardial infarction) ([place]) 29/56/2130   a. felt embolic in nature due to noncompliance with anticoagulation, LHC not pursued.  . 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 b. St. Jude gen change 2010.  [name] Thrombocytopenia ([place])   . 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  . Weight loss    Normal colonoscopy by Dr. Olevia Perches on 11/06/2010.    Patient Active Problem List   Diagnosis Date Noted  . Atrial fibrillation with RVR ([employer]) 08/22/2017  . Nausea vomiting and diarrhea   . 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 Palliative care encounter   . DNR (do not resuscitate)   . Pressure injury of skin 07/15/2017  . Orthostatic dizziness 06/20/2017  . Fecal impaction ([place]) 10/30/2016  . History of MI (myocardial infarction)   . History of DVT (deep vein thrombosis)   . 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 Thrombocytopenia ([place]) 09/10/2016  . Abdominal aortic atherosclerosis ([place]) 09/05/2016  . Dehydration 04/29/2016  . Protein-calorie malnutrition, severe 02/18/2016  . Constipation 02/17/2016  . Weakness generalized   . Chronic hydrocele   . Asbestosis ([place])   . CKD (chronic kidney disease) stage 3, GFR 30-59 ml/min (HCC)   . Coagulopathy ([place])   . Ileus ([place])   . Long term current use of opiate analgesic 06/27/2015  . Neuropathic pain of both feet 05/03/2015  . Dysuria 06/22/2014  . Healthcare maintenance 01/25/2014  . BPH (benign prostatic hyperplasia) 01/20/2014  . PVD (peripheral vascular disease) ([place]) 12/10/2013  . Severe malnutrition ([place]) 05/18/2013  . Light headedness 10/12/2012  . Chronic combined systolic and diastolic congestive heart failure ([place]) 06/20/2010  . H/O Arterial embolism of leg  06/05/2010  . Nonischemic  cardiomyopathy ([place]) 06/01/2010  . Major depressive disorder, recurrent episode ([place]) 01/19/2010  . History of hyperthyroidism 03/13/2009  . Bilateral hip pain 03/23/2007  . OSTEOARTHRITIS 09/05/2006  . COPD with acute exacerbation ([place]) 08/28/2006  . History of tobacco abuse 03/01/2006  . Essential hypertension 03/01/2006  . Cardiac pacemaker in situ 03/01/2006    Past Surgical History:  Procedure Laterality Date  . CARDIAC CATHETERIZATION  2003   Nl cors, EF 35%  . CARDIOVERSION N/A 05/27/2013   Procedure: CARDIOVERSION;  Surgeon: Candee Furbish, MD;  Location: [facility] ENDOSCOPY;  Service: Cardiovascular;  Laterality: N/A;  . EMBOLECTOMY Right 12/10/2013   Procedure: Right Femoral Embolectomy; Fasciotomy Right Lower Leg with Intraoperative arteriogram;  Surgeon: Rosetta Posner, MD;  Location: [place] [facility];  Service: Vascular;  Laterality: Right;  . EMBOLECTOMY Left 10/12/2014   Procedure: Left Femoral Thrombectomy with intraoperative arteriogram;  Surgeon: Rosetta Posner, MD;  Location: [facility];  Service: Vascular;  Laterality: Left;  .  FASCIOTOMY CLOSURE Right 12/15/2013   Procedure: LEG FASCIOTOMY CLOSURE;  Surgeon: Rosetta Posner, MD;  Location: [place];  Service: Vascular;  Laterality: Right;  . HEMIARTHROPLASTY HIP Left 09/09/2002   bipolar; Dr. [place] Dragon  . INSERT / REPLACE / REMOVE PACEMAKER  02/2003   Dr. Roe Rutherford, [employer] pulse generator identity SR model [id number] serial [id number]; gen change by Greggory Brandy  . INSERT / REPLACE / REMOVE PACEMAKER  06/17/2008   Lead and generator change w/ [employer] Medical Tendril ST model [id number] (serial  [id number]).       . INTRAOPERATIVE ARTERIOGRAM Right 12/10/2013   Procedure: INTRA OPERATIVE ARTERIOGRAM;  Surgeon: Rosetta Posner, MD;  Location: [place];  Service: Vascular;  Laterality: Right;  . JOINT REPLACEMENT    . TEE WITHOUT CARDIOVERSION N/A 05/27/2013   Procedure: TRANSESOPHAGEAL ECHOCARDIOGRAM (TEE);  Surgeon: Candee Furbish, MD;  Location[facility] ENDOSCOPY;   Service: Cardiovascular;  Laterality: N/A;  . TOTAL HIP ARTHROPLASTY Right 03/08/2008    By Dr. Hiram Comber        Home Medications    Prior to Admission medications   Medication Sig Start Date End Date Taking? Authorizing Provider  albuterol (VENTOLIN HFA) 108 (90 Base) MCG/ACT inhaler Inhale 1-2 puffs into the lungs every 6 (six) hours as needed for wheezing or shortness of breath. 08/13/17   Shela Leff, MD  apixaban (ELIQUIS) 2.5 MG TABS tablet Take 1 tablet (2.5 mg total) by mouth 2 (two) times daily. 11/25/17   Forde Dandy, PharmD  aspirin 81 MG chewable tablet Chew 1 tablet (81 mg total) by mouth daily. 02/12/17   Lenore Cordia, MD  buPROPion (WELLBUTRIN SR) 150 MG 12 hr tablet Take 1 tablet (150 mg total) by mouth 2 (two) times daily. 09/15/17   Lenore Cordia, MD  diltiazem (CARDIZEM CD) 120 MG 24 hr capsule TAKE 1 CAPSULE BY MOUTH EVERY DAY 11/24/17   Sid Falcon, MD  dronabinol (MARINOL) 10 MG capsule Take 1 capsule (10 mg total) by mouth 2 (two) times daily before a meal. 12/17/17   Carroll Sage, MD  ENSURE (ENSURE) Take 237 mLs by mouth 2 (two) times daily.    [provider]  linaclotide Rolan Lipa) 145 MCG CAPS capsule Take 1 capsule (145 mcg total) by mouth daily before breakfast. 12/17/17   Carroll Sage, MD  metoprolol succinate (TOPROL-XL) 100 MG 24 hr tablet TAKE 1 TABLET (100 MG TOTAL) BY MOUTH EVERY 12 HOURS. TAKE WITH OR IMMEDIATELY FOLLOWING A MEAL. 12/10/17   Axel Filler, MD  ondansetron (ZOFRAN-ODT) 4 MG disintegrating tablet Take 1 tablet (4 mg total) by mouth every 8 (eight) hours as needed for nausea or vomiting. 08/26/17   Tawny Asal, MD  oxyCODONE-acetaminophen (PERCOCET) 10-325 MG tablet Take 1 tablet by mouth every 4 (four) hours as needed for pain. Do not take more than 6 tablets in 24 hours. 02/10/18 03/12/18  Sid Falcon, MD  polyethylene glycol (MIRALAX / Floria Raveling) packet Take 17 g by mouth 2 (two) times daily. 08/26/17    Tawny Asal, MD  rosuvastatin (CRESTOR) 20 MG tablet Take 1 tablet (20 mg total) by mouth daily. 10/15/17   Lenore Cordia, MD  senna-docusate (SENOKOT-S) 8.6-50 MG tablet Take 2 tablets by mouth 2 (two) times daily. 08/26/17   Tawny Asal, MD  tiotropium (SPIRIVA) 18 MCG inhalation capsule Place 1 capsule (18 mcg total) into inhaler and inhale daily. 10/15/17   Lenore Cordia, MD  Family History Family History  Problem Relation Age of Onset  . Hypertension Mother   . Asthma Mother   . Coronary artery disease Mother   . Cancer Mother   . Stroke Mother   . Cancer Brother        Unsure of type.  [name] Heart disease Brother   . Heart attack Brother   . Heart attack Sister   . Colon cancer Neg Hx   . Lung cancer Neg Hx   . Prostate cancer Neg Hx     Social History Social History   Tobacco Use  . Smoking status: Current Some Day Smoker    Packs/day: 0.25    Years: 54.00    Pack years: 13.50    Types: Cigarettes    Start date: 19  . Smokeless tobacco: Current User  . Tobacco comment: cutting back  Substance Use Topics  . Alcohol use: No    Alcohol/week: 0.0 standard drinks    Comment: History of ETOH Abuse."  . Drug use: Yes    Types: Marijuana     Allergies   Amiodarone; Tape; Latex; and Penicillins   Review of Systems Review of Systems  Respiratory: Positive for shortness of breath and wheezing.   Cardiovascular: Positive for chest pain.  All other systems reviewed and are negative.    Physical Exam Updated Vital Signs BP 131/83 (BP Location: Right Arm)   Pulse 62   Temp (!) 97.4 F (36.3 C) (Oral)   Resp (!) 22   SpO2 95%   Physical Exam  Constitutional: He is oriented to person, place, and time. He appears well-developed and well-nourished.  Thin, somewhat cachectic appearing  HENT:  Head: Normocephalic and atraumatic.  Mouth/Throat: Oropharynx is clear and moist.  Eyes: Pupils are equal, round, and reactive to light. Conjunctivae and EOM are  normal.  Neck: Normal range of motion.  Cardiovascular: Normal heart sounds. An irregularly irregular rhythm present. Tachycardia present.  AFIB, rate 150's  Pulmonary/Chest: Accessory muscle usage present. No stridor. Tachypnea noted. No respiratory distress. He has wheezes.  Increased work of breathing, diffuse wheezes throughout  Abdominal: Soft. Bowel sounds are normal. There is no tenderness. There is no rebound.  No focal tenderness  Musculoskeletal: Normal range of motion.  Excoriations noted across back, more prominent on right flank and leg, no discrete rash appreciated  Neurological: He is alert and oriented to person, place, and time.  Skin: Skin is warm and dry.  Psychiatric: He has a normal mood and affect.  Nursing note and vitals reviewed.    ED Treatments / Results  Labs (all labs ordered are listed, but only abnormal results are displayed) Labs Reviewed  BASIC METABOLIC PANEL - Abnormal; Notable for the following components:      Result Value   Glucose, Bld 150 (*)    Creatinine, Ser 1.58 (*)    GFR calc non Af Amer 41 (*)    GFR calc Af Amer 48 (*)    Anion gap 16 (*)    All other components within normal limits  CBC - Abnormal; Notable for the following components:   WBC 13.2 (*)    HCT 53.6 (*)    All other components within normal limits  HEPATIC FUNCTION PANEL - Abnormal; Notable for the following components:   Bilirubin, Direct 0.3 (*)    All other components within normal limits  CULTURE, BLOOD (ROUTINE X 2)  CULTURE, BLOOD (ROUTINE X 2)  GASTROINTESTINAL PANEL BY PCR, STOOL (REPLACES STOOL  CULTURE)  LIPASE, BLOOD  BRAIN NATRIURETIC PEPTIDE  CBC  BASIC METABOLIC PANEL  T4, FREE  I-STAT TROPONIN, ED  POCT GASTRIC OCCULT BLOOD (1-CARD TO LAB)    EKG EKG Interpretation  Date/Time:  Thursday February 12 2018 22:54:38 EDT Ventricular Rate:  167 PR Interval:    QRS Duration: 94 QT Interval:  289 QTC Calculation: 482 R Axis:   60 Text  Interpretation:  Atrial fibrillation Abnormal R-wave progression, late transition Inferior infarct, age indeterminate Lateral leads are also involved Confirmed by Veryl Speak [phone]) on 02/13/2018 2:08:39 AM   Radiology Dg Chest Portable 1 View  Result Date: 02/13/2018 CLINICAL DATA:  Abdominal pain, dyspnea EXAM: PORTABLE CHEST 1 VIEW COMPARISON:  11/18/2017 chest radiograph. FINDINGS: Stable configuration of 2 lead right subclavian pacemaker with lead tips overlying the right ventricular apex. Stable cardiomediastinal silhouette with top-normal heart size. No pneumothorax. 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 Clear right lung. Consolidation in the left retrocardiac region appears worsened. IMPRESSION: 1. 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 2. Left retrocardiac consolidation appears worsened, cannot exclude superimposed aspiration or pneumonia. Follow-up chest radiographs advised. Electronically Signed   By: Ilona Sorrel M.D.   On: 02/13/2018 01:23   Dg Abd Portable 1 View  Result Date: 02/13/2018 CLINICAL DATA:  Constipation, abdominal pain EXAM: PORTABLE ABDOMEN - 1 VIEW COMPARISON:  08/24/2017 abdominal radiograph FINDINGS: No dilated small bowel loops. Marked colonic distention with gas and stool. No evidence of pneumatosis or pneumoperitoneum. No radiopaque nephrolithiasis. Moderate lumbar spondylosis. IMPRESSION: No evidence of small-bowel obstruction. Marked colonic distention with gas and stool, similar to prior radiographs, suggesting constipation and/or chronic colonic ileus/Ogilvie's syndrome. Electronically Signed   By: Ilona Sorrel M.D.   On: 02/13/2018 01:20    Procedures Procedures (including critical care time)  CRITICAL CARE Performed by: Larene Pickett   Total critical care time: 50  minutes  Critical care time was exclusive of separately billable procedures and treating other patients.  Critical care was necessary to treat or prevent imminent or life-threatening deterioration.  Critical care was time spent personally by me on the following activities: development of treatment plan with patient and/or surrogate as well as nursing, discussions with consultants, evaluation of patient's response to treatment, examination of patient, obtaining history from patient or surrogate, ordering and performing treatments and interventions, ordering and review of laboratory studies, ordering and review of radiographic studies, pulse oximetry and re-evaluation of patient's condition.   Medications Ordered in ED Medications  diltiazem (CARDIZEM) 1 mg/mL load via infusion 10 mg (10 mg Intravenous Bolus from Bag 02/13/18 0010)    And  diltiazem (CARDIZEM) 100 mg in dextrose 5% 159m (1 mg/mL) infusion (5 mg/hr Intravenous Transfusing/Transfer 02/13/18 0327)  pantoprazole (PROTONIX) EC tablet 40 mg (has no administration in time range)  aspirin chewable tablet 81 mg (has no administration in time range)  oxyCODONE-acetaminophen (PERCOCET) 10-325 MG per tablet 1 tablet (has no administration in time range)  metoprolol succinate (TOPROL-XL) 24 hr tablet 100 mg (has no administration in time range)  metoprolol succinate (TOPROL-XL) 24 hr tablet 100 mg (has no administration in time range)  rosuvastatin (CRESTOR) tablet 20 mg (has no administration in time range)  buPROPion (WELLBUTRIN SR) 12 hr tablet 150 mg (has no administration in time range)  linaclotide (LINZESS) capsule 145 mcg (has no administration in time range)  dronabinol (MARINOL)  capsule 10 mg (has no administration in time range)  apixaban (ELIQUIS) tablet 2.5 mg (has no administration in time range)  ipratropium-albuterol (DUONEB) 0.5-2.5 (3) MG/3ML nebulizer solution 3 mL (has no administration in time range)  sodium chloride  flush (NS) 0.9 % injection 3 mL (has no administration in time range)  acetaminophen (TYLENOL) tablet 650 mg (has no administration in time range)    Or  acetaminophen (TYLENOL) suppository 650 mg (has no administration in time range)  ondansetron (ZOFRAN) tablet 4 mg (has no administration in time range)    Or  ondansetron (ZOFRAN) injection 4 mg (has no administration in time range)  polyethylene glycol (MIRALAX / GLYCOLAX) packet 17 g (has no administration in time range)  methylPREDNISolone sodium succinate (SOLU-MEDROL) 125 mg/2 mL injection 125 mg (125 mg Intravenous Given 02/13/18 0007)  ondansetron (ZOFRAN) injection 4 mg (4 mg Intravenous Given 02/13/18 0159)     Initial Impression / Assessment and Plan / ED Course  I have reviewed the triage vital signs and the nursing notes.  Pertinent labs & imaging results that were available during my care of the patient were reviewed by me and considered in my medical decision making (see chart for details).  72 year old male here with respiratory distress.  Reports difficulty breathing of the past 2 days.  Also reports some chest pain today as well as cough.  He is afebrile and nontoxic.  He does appear thin and somewhat cachectic.  Based on chart review he has had some progressive generalized weakness for quite some time.  Currently is in in A. fib with RVR, A. fib is chronic but usually rate controlled.  States he has been compliant with his medications aside from his nighttime dose this evening.  Rates as high as 160s during exam.  Patient given Cardizem bolus and will start on drip.  Labs and imaging studies pending.  patient also reports some abdominal pain and urge to defecate but cannot go, will add abd films. Will monitor closely.   1:57 AM Went to reassess patient and he has now vomiting up dark emesis in the room.  No gross blood.  Gastric Hemoccult test performed and is positive for blood.  Likely etiology of patient's pain.  Heart rate  has improved somewhat with Cardizem, now in the 130's.  Will start Protonix.  He is on Xarelto so will need close monitoring.  He will require admission.  Denies prior evaluation by GI in the past, however it appears he had endoscopy performed by Dr. Olevia Perches in 2012 on my chart review.  She has since retired, will need to establish with GI physician.  As he is hemodynamically stable at this time feel that GI can be consulted in the morning.  Discussed with IM teaching service, have evaluated in the ED and will admit.  Final Clinical Impressions(s) / ED Diagnoses   Final diagnoses:  Atrial fibrillation with RVR ([place])  Hematemesis with nausea    ED Discharge Orders    None       Larene Pickett, PA-C 02/13/18 [id number]    Veryl Speak, MD 02/13/18 [phone]

## 2018-02-12 NOTE — ED Triage Notes (Signed)
Pt here via EMS, for resp distress that started tonight. Pt has had albuterol neb via EMS with some improvement. PT AO x 4. Pt complains of itching all over body.

## 2018-02-12 NOTE — Telephone Encounter (Signed)
No answer, lm for rtc 

## 2018-02-13 ENCOUNTER — Emergency Department (HOSPITAL_COMMUNITY): Payer: Medicare Other

## 2018-02-13 ENCOUNTER — Inpatient Hospital Stay (HOSPITAL_COMMUNITY): Payer: Medicare Other

## 2018-02-13 ENCOUNTER — Other Ambulatory Visit: Payer: Self-pay

## 2018-02-13 DIAGNOSIS — Z515 Encounter for palliative care: Secondary | ICD-10-CM | POA: Diagnosis not present

## 2018-02-13 DIAGNOSIS — M419 Scoliosis, unspecified: Secondary | ICD-10-CM

## 2018-02-13 DIAGNOSIS — K5900 Constipation, unspecified: Secondary | ICD-10-CM | POA: Diagnosis not present

## 2018-02-13 DIAGNOSIS — I700 Atherosclerosis of aorta: Secondary | ICD-10-CM | POA: Diagnosis present

## 2018-02-13 DIAGNOSIS — G629 Polyneuropathy, unspecified: Secondary | ICD-10-CM

## 2018-02-13 DIAGNOSIS — R634 Abnormal weight loss: Secondary | ICD-10-CM

## 2018-02-13 DIAGNOSIS — F1011 Alcohol abuse, in remission: Secondary | ICD-10-CM

## 2018-02-13 DIAGNOSIS — G92 Toxic encephalopathy: Secondary | ICD-10-CM

## 2018-02-13 DIAGNOSIS — Z95810 Presence of automatic (implantable) cardiac defibrillator: Secondary | ICD-10-CM

## 2018-02-13 DIAGNOSIS — J449 Chronic obstructive pulmonary disease, unspecified: Secondary | ICD-10-CM

## 2018-02-13 DIAGNOSIS — K920 Hematemesis: Secondary | ICD-10-CM | POA: Diagnosis present

## 2018-02-13 DIAGNOSIS — I351 Nonrheumatic aortic (valve) insufficiency: Secondary | ICD-10-CM | POA: Diagnosis not present

## 2018-02-13 DIAGNOSIS — K5903 Drug induced constipation: Secondary | ICD-10-CM | POA: Diagnosis present

## 2018-02-13 DIAGNOSIS — Z888 Allergy status to other drugs, medicaments and biological substances status: Secondary | ICD-10-CM

## 2018-02-13 DIAGNOSIS — N179 Acute kidney failure, unspecified: Secondary | ICD-10-CM

## 2018-02-13 DIAGNOSIS — R627 Adult failure to thrive: Secondary | ICD-10-CM | POA: Diagnosis not present

## 2018-02-13 DIAGNOSIS — D72829 Elevated white blood cell count, unspecified: Secondary | ICD-10-CM

## 2018-02-13 DIAGNOSIS — Z66 Do not resuscitate: Secondary | ICD-10-CM

## 2018-02-13 DIAGNOSIS — I4891 Unspecified atrial fibrillation: Secondary | ICD-10-CM | POA: Diagnosis not present

## 2018-02-13 DIAGNOSIS — N183 Chronic kidney disease, stage 3 (moderate): Secondary | ICD-10-CM

## 2018-02-13 DIAGNOSIS — E872 Acidosis: Secondary | ICD-10-CM | POA: Diagnosis not present

## 2018-02-13 DIAGNOSIS — K5939 Other megacolon: Secondary | ICD-10-CM | POA: Diagnosis present

## 2018-02-13 DIAGNOSIS — I428 Other cardiomyopathies: Secondary | ICD-10-CM | POA: Diagnosis present

## 2018-02-13 DIAGNOSIS — K219 Gastro-esophageal reflux disease without esophagitis: Secondary | ICD-10-CM

## 2018-02-13 DIAGNOSIS — J189 Pneumonia, unspecified organism: Secondary | ICD-10-CM | POA: Diagnosis present

## 2018-02-13 DIAGNOSIS — I340 Nonrheumatic mitral (valve) insufficiency: Secondary | ICD-10-CM

## 2018-02-13 DIAGNOSIS — Z79891 Long term (current) use of opiate analgesic: Secondary | ICD-10-CM

## 2018-02-13 DIAGNOSIS — I5022 Chronic systolic (congestive) heart failure: Secondary | ICD-10-CM | POA: Diagnosis present

## 2018-02-13 DIAGNOSIS — Z9981 Dependence on supplemental oxygen: Secondary | ICD-10-CM | POA: Diagnosis not present

## 2018-02-13 DIAGNOSIS — J9611 Chronic respiratory failure with hypoxia: Secondary | ICD-10-CM | POA: Diagnosis present

## 2018-02-13 DIAGNOSIS — I482 Chronic atrial fibrillation: Secondary | ICD-10-CM | POA: Diagnosis present

## 2018-02-13 DIAGNOSIS — R109 Unspecified abdominal pain: Secondary | ICD-10-CM | POA: Diagnosis not present

## 2018-02-13 DIAGNOSIS — Z79899 Other long term (current) drug therapy: Secondary | ICD-10-CM

## 2018-02-13 DIAGNOSIS — R112 Nausea with vomiting, unspecified: Secondary | ICD-10-CM

## 2018-02-13 DIAGNOSIS — Z681 Body mass index (BMI) 19 or less, adult: Secondary | ICD-10-CM | POA: Diagnosis not present

## 2018-02-13 DIAGNOSIS — I252 Old myocardial infarction: Secondary | ICD-10-CM

## 2018-02-13 DIAGNOSIS — Y92230 Patient room in hospital as the place of occurrence of the external cause: Secondary | ICD-10-CM | POA: Diagnosis not present

## 2018-02-13 DIAGNOSIS — E0590 Thyrotoxicosis, unspecified without thyrotoxic crisis or storm: Secondary | ICD-10-CM

## 2018-02-13 DIAGNOSIS — R64 Cachexia: Secondary | ICD-10-CM | POA: Diagnosis present

## 2018-02-13 DIAGNOSIS — R000 Tachycardia, unspecified: Secondary | ICD-10-CM | POA: Diagnosis not present

## 2018-02-13 DIAGNOSIS — K559 Vascular disorder of intestine, unspecified: Secondary | ICD-10-CM | POA: Diagnosis present

## 2018-02-13 DIAGNOSIS — X58XXXA Exposure to other specified factors, initial encounter: Secondary | ICD-10-CM | POA: Diagnosis present

## 2018-02-13 DIAGNOSIS — Z91040 Latex allergy status: Secondary | ICD-10-CM

## 2018-02-13 DIAGNOSIS — E43 Unspecified severe protein-calorie malnutrition: Secondary | ICD-10-CM | POA: Diagnosis present

## 2018-02-13 DIAGNOSIS — I130 Hypertensive heart and chronic kidney disease with heart failure and stage 1 through stage 4 chronic kidney disease, or unspecified chronic kidney disease: Secondary | ICD-10-CM | POA: Diagnosis present

## 2018-02-13 DIAGNOSIS — E785 Hyperlipidemia, unspecified: Secondary | ICD-10-CM

## 2018-02-13 DIAGNOSIS — Z880 Allergy status to penicillin: Secondary | ICD-10-CM

## 2018-02-13 DIAGNOSIS — Z77090 Contact with and (suspected) exposure to asbestos: Secondary | ICD-10-CM

## 2018-02-13 DIAGNOSIS — I129 Hypertensive chronic kidney disease with stage 1 through stage 4 chronic kidney disease, or unspecified chronic kidney disease: Secondary | ICD-10-CM

## 2018-02-13 DIAGNOSIS — Z7901 Long term (current) use of anticoagulants: Secondary | ICD-10-CM

## 2018-02-13 DIAGNOSIS — R918 Other nonspecific abnormal finding of lung field: Secondary | ICD-10-CM

## 2018-02-13 DIAGNOSIS — Z91048 Other nonmedicinal substance allergy status: Secondary | ICD-10-CM

## 2018-02-13 DIAGNOSIS — Z993 Dependence on wheelchair: Secondary | ICD-10-CM

## 2018-02-13 LAB — GASTROINTESTINAL PANEL BY PCR, STOOL (REPLACES STOOL CULTURE)
Adenovirus F40/41: NOT DETECTED
Astrovirus: NOT DETECTED
Campylobacter species: NOT DETECTED
Cryptosporidium: NOT DETECTED
Cyclospora cayetanensis: NOT DETECTED
Entamoeba histolytica: NOT DETECTED
Enteroaggregative E coli (EAEC): NOT DETECTED
Enteropathogenic E coli (EPEC): NOT DETECTED
Enterotoxigenic E coli (ETEC): NOT DETECTED
Giardia lamblia: NOT DETECTED
NOROVIRUS GI/GII: NOT DETECTED
PLESIMONAS SHIGELLOIDES: NOT DETECTED
Rotavirus A: NOT DETECTED
SAPOVIRUS (I, II, IV, AND V): NOT DETECTED
SHIGELLA/ENTEROINVASIVE E COLI (EIEC): NOT DETECTED
Salmonella species: NOT DETECTED
Shiga like toxin producing E coli (STEC): NOT DETECTED
Vibrio cholerae: NOT DETECTED
Vibrio species: NOT DETECTED
Yersinia enterocolitica: NOT DETECTED

## 2018-02-13 LAB — RESPIRATORY PANEL BY PCR
Adenovirus: NOT DETECTED
BORDETELLA PERTUSSIS-RVPCR: NOT DETECTED
CORONAVIRUS 229E-RVPPCR: NOT DETECTED
Chlamydophila pneumoniae: NOT DETECTED
Coronavirus HKU1: NOT DETECTED
Coronavirus NL63: NOT DETECTED
Coronavirus OC43: NOT DETECTED
Influenza A: NOT DETECTED
Influenza B: NOT DETECTED
MYCOPLASMA PNEUMONIAE-RVPPCR: NOT DETECTED
Metapneumovirus: NOT DETECTED
PARAINFLUENZA VIRUS 1-RVPPCR: NOT DETECTED
Parainfluenza Virus 2: NOT DETECTED
Parainfluenza Virus 3: NOT DETECTED
Parainfluenza Virus 4: NOT DETECTED
Respiratory Syncytial Virus: NOT DETECTED
Rhinovirus / Enterovirus: NOT DETECTED

## 2018-02-13 LAB — BASIC METABOLIC PANEL
ANION GAP: 14 (ref 5–15)
BUN: 26 mg/dL — ABNORMAL HIGH (ref 8–23)
CALCIUM: 9.3 mg/dL (ref 8.9–10.3)
CO2: 23 mmol/L (ref 22–32)
Chloride: 108 mmol/L (ref 98–111)
Creatinine, Ser: 1.82 mg/dL — ABNORMAL HIGH (ref 0.61–1.24)
GFR, EST AFRICAN AMERICAN: 40 mL/min — AB (ref >60)
GFR, EST NON AFRICAN AMERICAN: 35 mL/min — AB (ref >60)
Glucose, Bld: 146 mg/dL — ABNORMAL HIGH (ref 70–99)
Potassium: 3.6 mmol/L (ref 3.5–5.1)
Sodium: 145 mmol/L (ref 135–145)

## 2018-02-13 LAB — ECHOCARDIOGRAM COMPLETE
Height: 75 in
WEIGHTICAEL: 1957.68 [oz_av]

## 2018-02-13 LAB — CBC
HCT: 50.6 % (ref 39.0–52.0)
HEMOGLOBIN: 16.1 g/dL (ref 13.0–17.0)
MCH: 29.9 pg (ref 26.0–34.0)
MCHC: 31.8 g/dL (ref 30.0–36.0)
MCV: 93.9 fL (ref 78.0–100.0)
Platelets: 174 10*3/uL (ref 150–400)
RBC: 5.39 MIL/uL (ref 4.22–5.81)
RDW: 14.6 % (ref 11.5–15.5)
WBC: 14.6 10*3/uL — ABNORMAL HIGH (ref 4.0–10.5)

## 2018-02-13 LAB — LIPASE, BLOOD: Lipase: 28 U/L (ref 11–51)

## 2018-02-13 LAB — HEPATIC FUNCTION PANEL
ALBUMIN: 3.7 g/dL (ref 3.5–5.0)
ALT: 8 U/L (ref 0–44)
AST: 20 U/L (ref 15–41)
Alkaline Phosphatase: 62 U/L (ref 38–126)
Bilirubin, Direct: 0.3 mg/dL — ABNORMAL HIGH (ref 0.0–0.2)
Indirect Bilirubin: 0.5 mg/dL (ref 0.3–0.9)
Total Bilirubin: 0.8 mg/dL (ref 0.3–1.2)
Total Protein: 6.7 g/dL (ref 6.5–8.1)

## 2018-02-13 LAB — MRSA PCR SCREENING: MRSA BY PCR: NEGATIVE

## 2018-02-13 LAB — BRAIN NATRIURETIC PEPTIDE: B NATRIURETIC PEPTIDE 5: 195.9 pg/mL — AB (ref 0.0–100.0)

## 2018-02-13 LAB — GLUCOSE, CAPILLARY: GLUCOSE-CAPILLARY: 211 mg/dL — AB (ref 70–99)

## 2018-02-13 LAB — T4, FREE: Free T4: 1.52 ng/dL (ref 0.82–1.77)

## 2018-02-13 MED ORDER — OXYCODONE HCL 5 MG PO TABS: 5.0000 mg | ORAL_TABLET | Freq: Four times a day (QID) | ORAL | Status: DC | PRN

## 2018-02-13 MED ORDER — OXYCODONE-ACETAMINOPHEN 5-325 MG PO TABS
1.0000 | ORAL_TABLET | Freq: Four times a day (QID) | ORAL | Status: DC | PRN
  Filled 2018-02-13: qty 1

## 2018-02-13 MED ORDER — DRONABINOL 2.5 MG PO CAPS
10.0000 mg | ORAL_CAPSULE | Freq: Two times a day (BID) | ORAL | Status: DC
  Administered 2018-02-13 (×2): 10 mg via ORAL
  Filled 2018-02-13 (×2): qty 4

## 2018-02-13 MED ORDER — METOPROLOL SUCCINATE ER 100 MG PO TB24
100.0000 mg | ORAL_TABLET | Freq: Two times a day (BID) | ORAL | Status: DC
  Administered 2018-02-13: 100 mg via ORAL
  Filled 2018-02-13: qty 1

## 2018-02-13 MED ORDER — SODIUM CHLORIDE 0.9 % IV SOLN
8.0000 mg/h | INTRAVENOUS | Status: DC
  Filled 2018-02-13 (×2): qty 80

## 2018-02-13 MED ORDER — ACETAMINOPHEN 650 MG RE SUPP: 650.0000 mg | Freq: Four times a day (QID) | RECTAL | Status: DC | PRN

## 2018-02-13 MED ORDER — IPRATROPIUM-ALBUTEROL 0.5-2.5 (3) MG/3ML IN SOLN
3.0000 mL | Freq: Four times a day (QID) | RESPIRATORY_TRACT | Status: DC
  Administered 2018-02-13: 3 mL via RESPIRATORY_TRACT
  Filled 2018-02-13: qty 3

## 2018-02-13 MED ORDER — DILTIAZEM HCL ER COATED BEADS 120 MG PO CP24: 120.0000 mg | ORAL_CAPSULE | Freq: Every day | ORAL | Status: DC

## 2018-02-13 MED ORDER — IPRATROPIUM-ALBUTEROL 0.5-2.5 (3) MG/3ML IN SOLN
3.0000 mL | Freq: Four times a day (QID) | RESPIRATORY_TRACT | Status: DC | PRN
  Administered 2018-02-13: 3 mL via RESPIRATORY_TRACT
  Filled 2018-02-13: qty 3

## 2018-02-13 MED ORDER — OXYCODONE-ACETAMINOPHEN 5-325 MG PO TABS
2.0000 | ORAL_TABLET | Freq: Four times a day (QID) | ORAL | Status: DC | PRN
  Administered 2018-02-13 (×2): 2 via ORAL
  Administered 2018-02-13: 1 via ORAL
  Administered 2018-02-13: 2 via ORAL
  Filled 2018-02-13 (×3): qty 2

## 2018-02-13 MED ORDER — ROSUVASTATIN CALCIUM 20 MG PO TABS
20.0000 mg | ORAL_TABLET | Freq: Every day | ORAL | Status: DC
  Administered 2018-02-13: 20 mg via ORAL
  Filled 2018-02-13: qty 2
  Filled 2018-02-13: qty 1

## 2018-02-13 MED ORDER — DILTIAZEM HCL-DEXTROSE 100-5 MG/100ML-% IV SOLN (PREMIX)
5.0000 mg/h | INTRAVENOUS | Status: DC
  Administered 2018-02-13: 5 mg/h via INTRAVENOUS
  Filled 2018-02-13: qty 100

## 2018-02-13 MED ORDER — ONDANSETRON HCL 4 MG/2ML IJ SOLN
4.0000 mg | Freq: Four times a day (QID) | INTRAMUSCULAR | Status: DC | PRN
  Administered 2018-02-13: 4 mg via INTRAVENOUS
  Filled 2018-02-13: qty 2

## 2018-02-13 MED ORDER — SODIUM CHLORIDE 0.9% FLUSH
3.0000 mL | Freq: Two times a day (BID) | INTRAVENOUS | Status: DC
  Administered 2018-02-13 (×3): 3 mL via INTRAVENOUS

## 2018-02-13 MED ORDER — BUPROPION HCL ER (SR) 150 MG PO TB12
150.0000 mg | ORAL_TABLET | Freq: Two times a day (BID) | ORAL | Status: DC
  Administered 2018-02-13 (×2): 150 mg via ORAL
  Filled 2018-02-13 (×2): qty 1

## 2018-02-13 MED ORDER — ENSURE ENLIVE PO LIQD
237.0000 mL | Freq: Three times a day (TID) | ORAL | Status: DC
  Administered 2018-02-13: 237 mL via ORAL

## 2018-02-13 MED ORDER — INFLUENZA VAC SPLIT HIGH-DOSE 0.5 ML IM SUSY: 0.5000 mL | PREFILLED_SYRINGE | INTRAMUSCULAR | Status: DC

## 2018-02-13 MED ORDER — POLYETHYLENE GLYCOL 3350 17 G PO PACK
17.0000 g | PACK | Freq: Every day | ORAL | Status: DC
  Administered 2018-02-13: 17 g via ORAL
  Filled 2018-02-13: qty 1

## 2018-02-13 MED ORDER — NALOXONE HCL 0.4 MG/ML IJ SOLN
INTRAMUSCULAR | Status: AC
  Administered 2018-02-13: 0.4 mg
  Filled 2018-02-13: qty 1

## 2018-02-13 MED ORDER — ONDANSETRON HCL 4 MG/2ML IJ SOLN
4.0000 mg | Freq: Once | INTRAMUSCULAR | Status: AC
  Administered 2018-02-13: 4 mg via INTRAVENOUS
  Filled 2018-02-13: qty 2

## 2018-02-13 MED ORDER — APIXABAN 2.5 MG PO TABS
2.5000 mg | ORAL_TABLET | Freq: Two times a day (BID) | ORAL | Status: DC
  Administered 2018-02-13 (×2): 2.5 mg via ORAL
  Filled 2018-02-13 (×2): qty 1

## 2018-02-13 MED ORDER — OXYCODONE-ACETAMINOPHEN 10-325 MG PO TABS: 1.0000 | ORAL_TABLET | Freq: Four times a day (QID) | ORAL | Status: DC | PRN

## 2018-02-13 MED ORDER — LINACLOTIDE 145 MCG PO CAPS
145.0000 ug | ORAL_CAPSULE | Freq: Every day | ORAL | Status: DC
  Administered 2018-02-13: 145 ug via ORAL
  Filled 2018-02-13 (×3): qty 1

## 2018-02-13 MED ORDER — METOPROLOL SUCCINATE ER 100 MG PO TB24
100.0000 mg | ORAL_TABLET | Freq: Once | ORAL | Status: AC
  Administered 2018-02-13: 100 mg via ORAL
  Filled 2018-02-13: qty 1

## 2018-02-13 MED ORDER — ONDANSETRON HCL 4 MG PO TABS: 4.0000 mg | ORAL_TABLET | Freq: Four times a day (QID) | ORAL | Status: DC | PRN

## 2018-02-13 MED ORDER — DILTIAZEM HCL 60 MG PO TABS
60.0000 mg | ORAL_TABLET | Freq: Two times a day (BID) | ORAL | Status: AC
  Administered 2018-02-13: 60 mg via ORAL
  Filled 2018-02-13: qty 1

## 2018-02-13 MED ORDER — LEVALBUTEROL HCL 0.63 MG/3ML IN NEBU
0.6300 mg | INHALATION_SOLUTION | Freq: Four times a day (QID) | RESPIRATORY_TRACT | Status: DC | PRN
  Administered 2018-02-13: 0.63 mg via RESPIRATORY_TRACT
  Filled 2018-02-13: qty 3

## 2018-02-13 MED ORDER — ACETAMINOPHEN 325 MG PO TABS: 650.0000 mg | ORAL_TABLET | Freq: Four times a day (QID) | ORAL | Status: DC | PRN

## 2018-02-13 MED ORDER — SODIUM CHLORIDE 0.9 % IV SOLN
INTRAVENOUS | Status: DC
  Administered 2018-02-13: 05:00:00 via INTRAVENOUS

## 2018-02-13 MED ORDER — ASPIRIN 81 MG PO CHEW
81.0000 mg | CHEWABLE_TABLET | Freq: Every day | ORAL | Status: DC
  Administered 2018-02-13: 81 mg via ORAL
  Filled 2018-02-13: qty 1

## 2018-02-13 MED ORDER — POLYETHYLENE GLYCOL 3350 17 G PO PACK: 17.0000 g | PACK | Freq: Every day | ORAL | Status: DC | PRN

## 2018-02-13 MED ORDER — SODIUM CHLORIDE 0.9 % IV SOLN: INTRAVENOUS | Status: AC

## 2018-02-13 MED ORDER — SENNOSIDES-DOCUSATE SODIUM 8.6-50 MG PO TABS
2.0000 | ORAL_TABLET | Freq: Two times a day (BID) | ORAL | Status: DC
  Administered 2018-02-13 (×2): 2 via ORAL
  Filled 2018-02-13 (×2): qty 2

## 2018-02-13 MED ORDER — GI COCKTAIL ~~LOC~~: 30.0000 mL | Freq: Three times a day (TID) | ORAL | Status: DC | PRN

## 2018-02-13 MED ORDER — METOPROLOL TARTRATE 5 MG/5ML IV SOLN
10.0000 mg | INTRAVENOUS | Status: AC | PRN
  Administered 2018-02-13 (×2): 10 mg via INTRAVENOUS
  Filled 2018-02-13 (×2): qty 10

## 2018-02-13 MED ORDER — SODIUM CHLORIDE 0.9 % IV SOLN
80.0000 mg | Freq: Once | INTRAVENOUS | Status: DC
  Filled 2018-02-13: qty 80

## 2018-02-13 MED ORDER — ORAL CARE MOUTH RINSE: 15.0000 mL | Freq: Two times a day (BID) | OROMUCOSAL | Status: DC

## 2018-02-13 MED ORDER — PANTOPRAZOLE SODIUM 40 MG PO TBEC
40.0000 mg | DELAYED_RELEASE_TABLET | Freq: Two times a day (BID) | ORAL | Status: DC
  Administered 2018-02-13 (×2): 40 mg via ORAL
  Filled 2018-02-13 (×3): qty 1

## 2018-02-13 MED ORDER — HYDROXYZINE HCL 10 MG PO TABS
10.0000 mg | ORAL_TABLET | Freq: Once | ORAL | Status: DC
  Filled 2018-02-13: qty 1

## 2018-02-13 NOTE — Progress Notes (Signed)
RT went to give pt CAT tx, heart rate 163, CAT contraindicated at this time. RN alerted.

## 2018-02-13 NOTE — H&P (Addendum)
Date: 02/13/2018               Patient Name:  [name] MRN: [id number]  DOB: 1945/08/26 Age / Sex: 72 y.o., male   PCP: Carroll Sage, MD         Medical Service: Internal Medicine Teaching Service         Attending Physician: Dr. [name], MD    First Contact: Dr. Truman Hayward Pager: [phone]  Second Contact: Dr. Trilby Drummer Pager: [phone]       After Hours (After 5p/  First Contact Pager: [phone]  weekends / holidays): Second Contact Pager: [phone]   Chief Complaint: SOB  History of Present Illness:  [name] is a 72yo male with history of STEMI, atrial fibrillation, asbestos exposure, hyperthyroidism, HTN, HLD, GERD, COPD on 3L supplemental O2, CKD stage III, and alcohol abuse presenting to the ED via EMS with SOB weakness, afib with RVR and began having dark emesis while in the ED. On visiting patient he states that earlier this week he began to have increasing SOB and non-productive cough. He states he has felt feverish as well and has constant dizziness. About two days ago he began to have nausea, vomiting and abdominal pain. He states his vomit has appeared clear until tonight when his emesis was dark in the ED. He states his vomit has never looked like this before. He denies NSAID use and stopped drinking over five years ago.   He states he has not been able to eat much the past few days and has lost over 80lbs in the last two years. He has lost 9lb since 9/10 when he was in the IMTS clinic for weakness and SOB. He states he has continued to drink fluids to stay hydrated. He states he has diarrhea sometimes but usually constipation and takes linzess and miralax at home.  He states he has been urinating and denies dysuria, chest pain. He endorses numbness in his feet and states this is chronic.   He had a small amount of watery diarrhea in the ED. He was afebrile, tachycardic at 112 in afib on cardizem drip, sats 97% on 3.5L. He received solumedrol, albuterol, and  protonix.     Meds:  Current Meds  Medication Sig  . albuterol (VENTOLIN HFA) 108 (90 Base) MCG/ACT inhaler Inhale 1-2 puffs into the lungs every 6 (six) hours as needed for wheezing or shortness of breath.  [name] apixaban (ELIQUIS) 2.5 MG TABS tablet Take 1 tablet (2.5 mg total) by mouth 2 (two) times daily.  [name] aspirin 81 MG chewable tablet Chew 1 tablet (81 mg total) by mouth daily.  [name] buPROPion (WELLBUTRIN SR) 150 MG 12 hr tablet Take 1 tablet (150 mg total) by mouth 2 (two) times daily.  [name] diltiazem (CARDIZEM CD) 120 MG 24 hr capsule TAKE 1 CAPSULE BY MOUTH EVERY DAY  . dronabinol (MARINOL) 10 MG capsule Take 1 capsule (10 mg total) by mouth 2 (two) times daily before a meal.  . linaclotide (LINZESS) 145 MCG CAPS capsule Take 1 capsule (145 mcg total) by mouth daily before breakfast. (Patient taking differently: Take 145 mcg by mouth every other day. )  . metoprolol succinate (TOPROL-XL) 100 MG 24 hr tablet TAKE 1 TABLET (100 MG TOTAL) BY MOUTH EVERY 12 HOURS. TAKE WITH OR IMMEDIATELY FOLLOWING A MEAL.  [name] ondansetron (ZOFRAN-ODT) 4 MG disintegrating tablet Take 1 tablet (4 mg total) by mouth every 8 (eight) hours as needed for nausea  or vomiting.  [name] oxyCODONE-acetaminophen (PERCOCET) 10-325 MG tablet Take 1 tablet by mouth every 4 (four) hours as needed for pain. Do not take more than 6 tablets in 24 hours.  . polyethylene glycol (MIRALAX / GLYCOLAX) packet Take 17 g by mouth 2 (two) times daily. (Patient taking differently: Take 17 g by mouth daily as needed for mild constipation. )  . rosuvastatin (CRESTOR) 20 MG tablet Take 1 tablet (20 mg total) by mouth daily.  [name] senna-docusate (SENOKOT-S) 8.6-50 MG tablet Take 2 tablets by mouth 2 (two) times daily. (Patient taking differently: Take 2 tablets by mouth at bedtime as needed for mild constipation. )  . tiotropium (SPIRIVA) 18 MCG inhalation capsule Place 1 capsule (18 mcg total) into inhaler and inhale daily.     Allergies: Allergies as of  01/27/2018 - Review Complete 01/20/2018  Allergen Reaction Noted  . Amiodarone Other (See Comments) 07/30/2015  . Tape Itching 07/21/2017  . Latex Rash 08/22/2017  . Penicillins Rash and Other (See Comments)    Past Medical History:  Diagnosis Date  . Alcohol abuse   . Anxiety   . Asbestos exposure   . Atrial fibrillation with RVR ([place]) 01/17/2014  . Avascular necrosis of bones of both hips (HCC)    Status post bilateral hip replacements  . Bipolar disorder ([place])   . Chronic systolic heart failure ([place])    a. NICM - EF 35% with normal cors 2003. b. Last echo 11/2012 - EF 25-30%.  . CKD (chronic kidney disease), stage III ([place])    Renal U/S 12/04/2009 showed no pathological findings. Labs 12/04/2009 include normal ESR, C3, C4; neg ANA; SPEP showed nonspecific increase in the alpha-2 region with no M-spike; UPEP showed no monoclonal free light chains; urine IFE showed polyclonal increase in feree Kappa and/or free Lambda light chains. Baseline Cr reported 1.7-2.5.  [name] COPD (chronic obstructive pulmonary disease) ([place])   . Depression   . DVT (deep venous thrombosis) ([place])    He has hypercoagulability with multiple prior DVTs  . E coli bacteremia   . Erectile dysfunction   . Failure to thrive (0-17) 08/2016   DEHYDRATION  . Gastritis   . GERD (gastroesophageal reflux disease)   . HCAP (healthcare-associated pneumonia) 08/24/2015  . Hydrocele, unspecified   . Hyperlipemia   . Hypertension   . 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  Thyroid scan 08/28/2009 was normal with no focal areas of abnormal increased or decreased activity seen; the uptake of I 131 sodium iodide at 24 hours was 5.7%.  TSH and free T4 normalized by 08/16/2009.  . Intestinal obstruction ([place])   . Lung nodule    Chest CT scan on 12/14/2008 showed a nodular opacity at the left lung base felt to most likely represent scarring.  Follow-up chest CT scan on 06/02/2010 showed  parenchymal scarring in the left apex, left  lower lobe, and lingula; some of this scarring at the left base had a nodular appearance, unchanged. Chest 09/2012 - stable.  [name] NICM (nonischemic cardiomyopathy) ([place])   . Noncompliance   . On home oxygen therapy    "3L periodically" (08/24/2015)  . Osteoarthritis   . Permanent atrial fibrillation ([place])    a. failed ablation in 2010 due to inability to access/venous occlusions. b. failed multaq, amiodarone, tikosyn. c. has not been felt to be a good candidate for AVN ablation due to recurrent infections/bacteremia history.  . Pleural plaque    H/o asbestos  exposure. Chest CT on 06/02/2010 showed stable extensive calcified pleural plaques involving the left hemithorax, consistent with asbestos related pleural disease.  . Portal vein thrombosis   . Protein calorie malnutrition ([place]) 04/2016  . Spondylosis   . STEMI (ST elevation myocardial infarction) ([place]) 69/67/8938   a. felt embolic in nature due to noncompliance with anticoagulation, LHC not pursued.  . 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 b. St. Jude gen change 2010.  [name] Thrombocytopenia ([place])   . 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  . Weight loss    Normal colonoscopy by Dr. Olevia Perches on 11/06/2010.    Family History:  Family History  Problem Relation Age of Onset  . Hypertension Mother   . Asthma Mother   . Coronary artery disease Mother   . Cancer Mother   . Stroke Mother   . Cancer Brother        Unsure of type.  [name] Heart disease Brother   . Heart attack Brother   . Heart attack Sister   . Colon cancer Neg Hx   . Lung cancer Neg Hx   . Prostate cancer Neg Hx      Social History:  Patient lives at home by himself and is wheelchair bound. His daughter and neighbor help him at home.   Review of Systems: A complete ROS was negative  except as per HPI.   Physical Exam: Blood pressure (!) 117/96, pulse (!) 58, temperature (!) 97.4 F (36.3 C), temperature source Oral, resp. rate (!) 24, SpO2 98 %.  Constitution: cachectic, chronically ill appearing, mild distress HEENT: no scleral icterus, EOM intact, AC/[place] Cardio: irregularly irregular, no m/r/g, weak pedal pulses, cold extremities  Respiratory: positive for wheezing bilaterally, coarse breath sounds on left Abdominal: thin, non-distended, diffusely TTP in epigastric area and suprapubically  MSK: weak, could not sit up for lung exam, no edema Neuro: A&Ox3, cooperative Skin: LE skin changes with flaking skin  BMP Latest Ref Rng & Units 02/13/2018 01/29/2018 01/27/2018  Glucose 70 - 99 mg/dL 146(H) 150(H) 95  BUN 8 - 23 mg/dL 26(H) 21 13  Creatinine 0.61 - 1.24 mg/dL 1.82(H) 1.58(H) 1.28(H)  BUN/Creat Ratio 10 - 24 - - 10  Sodium 135 - 145 mmol/L 145 145 145(H)  Potassium 3.5 - 5.1 mmol/L 3.6 3.7 3.8  Chloride 98 - 111 mmol/L 108 107 109(H)  CO2 22 - 32 mmol/L 23 22 18(L)  Calcium 8.9 - 10.3 mg/dL 9.3 9.1 9.0   CBC Latest Ref Rng & Units 02/13/2018 01/23/2018 01/27/2018  WBC 4.0 - 10.5 K/uL 14.6(H) 13.2(H) 7.7  Hemoglobin 13.0 - 17.0 g/dL 16.1 16.5 14.9  Hematocrit 39.0 - 52.0 % 50.6 53.6(H) 44.4  Platelets 150 - 400 K/uL 174 195 178    EKG: personally reviewed my interpretation is atrial fibrillation with RVR.  CXR: personally reviewed my interpretation is left lower lobe scarring seen on previous xray from asbestos exposure with additional left lower lobe consolidation   Assessment & Plan by Problem: Principal Problem:   Atrial fibrillation with RVR ([place]) Active Problems:   Severe malnutrition (HCC)   Weakness generalized   Nausea and vomiting  72yo male with history of STEMI, atrial fibrillation, asbestos exposure, hyperthyroidism, HTN, HLD, GERD, COPD on 3L supplemental O2, CKD stage III, and alcohol abuse presenting to the ED via EMS with SOB weakness,  afib with RVR and  began having dark emesis while in the ED.  SOB: Patient endorses increasing SOB and cough for the past week. He has a history of COPD on 3L O2, HFrEF (30% 3/17), and asbestos exposure with bilateral lower lobe scarring seen on cxr chronically, more significant on the left. CXR additionally showed LLL opacity but unclear if consolidation vs. Effusion. He has a leukocytosis but is afebrile. His symptoms are likely secondary to exacerbation of his chronic conditions as he presented to clinic on 9/10 with increasing SOB and weakness but pneumonia is on his differential. Unlikely to tolerate lateral cxr as he could not sit up on exam. Consider chest CT with asbestos history and last full chest CT was 2017.   - Resp viral panel ordered - ECHO - admit to stepdown  - am BMP - BNP ordered - consider chest CT    Heme positive emesis and abdominal pain: Patient had one episode dark emesis with positive gastric occult test in the ED, prior to this vomitus clear. His Hgb is currently stable at 16.1. He has chronic constipation, nausea, and vomiting for which he takes linzess & miralax, and his dark emesis may be irritation from repeated vomiting. CT abd 3/19 for similar symptoms did not show mass or ischemia but concern for adynamic ileus. His abdominal pain is likely secondary to his chronic constipation, and there is significant stool burden seen on abdominal xr. He has lost 9lbs since 9/10 and appears hypovolemic. Normal colonoscopy 2012.   - 75cc/hr NS - monitor for fluid overload  - am CBC  - Protonix 76m bid  - GI pathogen panel  - GI cocktail tid - cont. Home marinol for nausea, added zofran prn - cont. Linzess, add senna-docusate  - blood cultures  x2   Afib with RVR: Patient presents with Afib wit RVR in 160s that was controlled in the ED with cardizem drip. He was unable to take his evening medications due to vomiting. He has had previous admissions for similar episodes. We  will try to wean his drip and start his home medications.   - wean cardizem drip - cont. Home cardizem once drip stopped - cont. Home toprol xl [id number] - cont. Eliquis  AKI with CKD Stage III: Cr. 1.82, baseline ~1.35. Likely prerenal secondary to dehydration and vomiting.   - am BMP and NS drip   Hyperthyroidism: elevated TSH two weeks ago.   - free T4 ordered  Chronic Generalized Weakness: He states his weakness is chronic and has been worsening. In addition, he has had ongoing weight loss and recently became bed bound and has been unable to participate in therapy at home consistently. He lives at home alone although has help from his daughter and neighbors. Consider palliative care consult for discussion of goals of care.   Diet: NPO with ice chips VTE: Eliquis IVF: 75cc/hr NS Code: DNR  Dispo: Admit patient to Inpatient with expected length of stay greater than 2 midnights.  Signed: Marty HeckDO 02/13/2018, 3:35 AM  Pager: [phone]

## 2018-02-13 NOTE — Progress Notes (Signed)
OT Cancellation Note  Patient Details Name: [name] MRN: [id number] DOB: 18-May-1946   Cancelled Treatment:    Reason Eval/Treat Not Completed: Other (comment). Second attempt to see pt for OT eval. Pt declining this morning due to abdominal pain and not feeling good. Pt requesting therapy try again later this afternoon.   Tyrone Schimke, OT Acute Rehabilitation Services Pager: [phone] Office: [phone]  02/13/2018, 12:14 PM

## 2018-02-13 NOTE — Progress Notes (Signed)
Called to bedside for PRN neb tx. Pt had Duo ordered. Tx started, but stopped due to increased HR after meds were given. Neb tx's changed to Xopenex Q6 PRN. Pt noted to have mainly upper airway wheeze, resonating to all fields, and rhonchi. 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 RT Will continue to monitor.

## 2018-02-13 NOTE — Evaluation (Signed)
Physical Therapy Evaluation Patient Details Name: [name] MRN: [id number] DOB: March 22, 1946 Today's Date: 02/13/2018   History of Present Illness  [name] is a a 72 yo community dwelling nonambulatory man with chronic hypoxic respiratory failure secondary to COPD on 3 L O2 at home, A. fib, peripheral neuropathy, and unintentional weight loss.  He presents with new onset abdominal pain.    Clinical Impression  Patient presents with decreased independence with mobility due to generalized weakness and bedrest.  Agreed for OOB to chair this pm and stood twice for hygiene.  Likely not far off his baseline, but as lives alone will benefit from skilled PT in the acute setting to ensure safety and independence with mobility and recommend resuming HHPT at d/c.     Follow Up Recommendations Home health PT    Equipment Recommendations  None recommended by PT    Recommendations for Other Services       Precautions / Restrictions Precautions Precautions: Fall Precaution Comments: reports recent fall at home, up eventually on his own      Mobility  Bed Mobility Overal bed mobility: Needs Assistance Bed Mobility: Supine to Sit     Supine to sit: Supervision;HOB elevated     General bed mobility comments: used rail to assist  Transfers Overall transfer level: Needs assistance Equipment used: Rolling walker (2 wheeled) Transfers: Lateral/Scoot Transfers;Sit to/from Stand Sit to Stand: Min assist        Lateral/Scoot Transfers: Supervision General transfer comment: from bed to drop arm scooting without assist; stood twice for hygiene and to place pillows in chair with min a for safety with RW  Ambulation/Gait                Stairs            Wheelchair Mobility    Modified Rankin (Stroke Patients Only)       Balance                                             Pertinent Vitals/Pain Pain Assessment: Faces Faces Pain Scale: Hurts even  more Pain Location: generalized Pain Descriptors / Indicators: Aching;Discomfort Pain Intervention(s): Repositioned;Monitored during session;Patient requesting pain meds-RN notified    Home Living Family/patient expects to be discharged to:: Private residence Living Arrangements: Alone Available Help at Discharge: Family;Available PRN/intermittently;Neighbor Type of Home: Apartment Home Access: Elevator     Home Layout: One level Home Equipment: Bedside commode;Walker - 4 wheels;Walker - 2 wheels;Wheelchair - manual;Shower seat;Grab bars - tub/shower;Grab bars - toilet Additional Comments: daughter assists with meals and shower, neighbor also assists     Prior Function Level of Independence: Needs assistance   Gait / Transfers Assistance Needed: transfers to w/c independently pushes chair with UE's  ADL's / Homemaking Assistance Needed: daughter assists with bathing, and iADLs,   Comments: reports just recently discharged from [facility] through [place]: Right    Extremity/Trunk Assessment   Upper Extremity Assessment Upper Extremity Assessment: Generalized weakness    Lower Extremity Assessment Lower Extremity Assessment: Generalized weakness    Cervical / Trunk Assessment Cervical / Trunk Assessment: Other exceptions Cervical / Trunk Exceptions: cachexia; noted prominent sacrum so pillows in chair, RN aware  Communication   Communication: No difficulties  Cognition Arousal/Alertness: Awake/alert Behavior During Therapy: WFL for tasks assessed/performed Overall Cognitive  Status: Within Functional Limits for tasks assessed                                        General Comments      Exercises     Assessment/Plan    PT Assessment Patient needs continued PT services  PT Problem List Decreased strength;Decreased mobility;Decreased balance;Decreased knowledge of use of DME;Decreased safety awareness       PT  Treatment Interventions DME instruction;Therapeutic activities;Therapeutic exercise;Patient/family education;Balance training;Functional mobility training;Wheelchair mobility training    PT Goals (Current goals can be found in the Care Plan section)  Acute Rehab PT Goals Patient Stated Goal: To go home PT Goal Formulation: With patient Time For Goal Achievement: 02/27/18 Potential to Achieve Goals: Good    Frequency Min 3X/week   Barriers to discharge        Co-evaluation               AM-PAC PT "6 Clicks" Daily Activity  Outcome Measure Difficulty turning over in bed (including adjusting bedclothes, sheets and blankets)?: None Difficulty moving from lying on back to sitting on the side of the bed? : A Little Difficulty sitting down on and standing up from a chair with arms (e.g., wheelchair, bedside commode, etc,.)?: A Little Help needed moving to and from a bed to chair (including a wheelchair)?: A Little Help needed walking in hospital room?: A Lot Help needed climbing 3-5 steps with a railing? : Total 6 Click Score: 16    End of Session Equipment Utilized During Treatment: Gait belt;Oxygen Activity Tolerance: Patient tolerated treatment well Patient left: with call bell/phone within reach;in chair;with chair alarm set   PT Visit Diagnosis: Other abnormalities of gait and mobility (R26.89);History of falling (Z91.81);Muscle weakness (generalized) (M62.81)    Time: 2957-4734 PT Time Calculation (min) (ACUTE ONLY): 28 min   Charges:   PT Evaluation $PT Eval Moderate Complexity: 1 Mod PT Treatments $Therapeutic Activity: 8-22 mins        Magda Kiel, [place] Acute Rehabilitation Services [phone] 02/13/2018   Reginia Naas 02/13/2018, 4:57 PM

## 2018-02-13 NOTE — Progress Notes (Addendum)
Subjective:  [name] is a 72 y.o. with PMH of COPD, A.fib, Peripheral neuropathy, unintentional weight loss, STEMI, and Asbestosis admit for abdominal pain on hospital day 0  [name] was examined and evaluated at bedside this AM and reports that his last bowel movement was 2 weeks ago. Reports that he feels weak, endorses cough with mucus production, chest pain which began on Tuesday but resembles "heart burn." This began suddenly when he was lying but states he takes an antacid. States that he usually takes Miralax at home but last dose was 2 weeks ago. Reports that he presented to the hospital because "he wasn't feeling right" and was starting to have increasing abdominal pain. States that at baseline he can transfer himself to his wheelchair and he was able to do so as recent as this Monday. Reports that he receives assistance from his neighbor and daughter. His son also stays with him usually from Friday to Sunday.   We initiated goals of care discussion with patient and made him aware that we are concerned about his ongoing weight loss and worried about him living alone. He is willing to meet with palliative to for further discussion.   Objective:  Vital signs in last 24 hours: Vitals:   02/13/18 0845 02/13/18 0900 02/13/18 1200 02/13/18 1211  BP:  114/84 114/88   Pulse:  (!) 59  88  Resp:  (!) 22  16  Temp: 98.4 F (36.9 C)   98.6 F (37 C)  TempSrc: Axillary   Oral  SpO2:  100%  99%  Weight:      Height:       Physical Exam  Constitutional: He is oriented to person, place, and time.  Thin, cachetic appearing male  HENT:  Head: Normocephalic and atraumatic.  Mouth/Throat: Oropharynx is clear and moist. No oropharyngeal exudate.  Eyes: Pupils are equal, round, and reactive to light. Conjunctivae and EOM are normal. No scleral icterus.  Neck: Normal range of motion. Neck supple. No JVD present.  Cardiovascular: Intact distal pulses.  Irregularly irregular. No M/r/g.  Pacemaker on right sternal border   Pulmonary/Chest: Effort normal. He has wheezes (expiratory wheezing). He has no rales.  Coarse breath sounds L>R  Abdominal: Soft. Bowel sounds are normal. There is tenderness (diffuse tenderness to palpation). There is no rebound and no guarding.  Musculoskeletal: Normal range of motion. He exhibits no edema, tenderness or deformity.  Lymphadenopathy:    He has no cervical adenopathy.  Neurological: He is alert and oriented to person, place, and time. No cranial nerve deficit.  Lower extremity strength 2/5 bilaterally.   Skin: Skin is warm and dry.  Psychiatric: Mood, memory, affect and judgment normal.   Assessment/Plan:  Principal Problem:   Atrial fibrillation with RVR (HCC) Active Problems:   Severe malnutrition (HCC)   Weakness generalized   Nausea and vomiting  [name] is a 72 yo M w/ PMH stated above admit for abdominal pain.  His GI panel and respiratory panel were negative for infectious causes.  KUB shows significant colonic distention.  His abdominal pain is most likely due to constipation especially as patient is reporting that his last bowel movement was 2 weeks ago.  His constipation is not helped by the fact that he is on chronic opioid medications.  We will try aggressive bowel regimen to resolve his constipation. Meanwhile, his failure to thrive and worsening of his chronic conditions is concerning for his overall prognosis.  Chart review shows he had conversation  with palliative a year ago.  We believe it is time for reassessment and an updated discussion about goals of care.  Abdominal Pain 2/2 Constipation Endorsing hx of going 2 weeks w/o bowel movement. Takes Miralax at home but haven't been taking for 2 week for unclear reason. Will up-titrate bowel regimen - Start Miralax, Senokot 2 tabs BID - Can consider Mag Citrate if no bowel movements today - C/w Pantoprazole 40mg  BID, GI cocktail Prn - c/w Zofran PRN for nausea - F/u  Blood Cultures  Unintentiona Weight Loss 2/2 Failure to thrive vs poor oral intake 59.6kg in 01/27/2018 -> 55.5kg this admission. Delta -9 lbs. Endorsing poor appetite. Palliative states they will see him tomorrow - PT/OT eval - Appreciate palliative recs - C/w dronabinol 10mg  BID for appetite stimulant  Dyspnea 2/2 COPD vs Asbestosis vs Pneumonia Wbc 14.6 but received solumedrol in ED. X-ray show chronic pleural-parenchymal scarring and left retrocardiac consolidation. Currently satting 99 on 2L [place]. - C/w O2 Ney keep O2sat above 88 - CT Chest after clarification with palliative  A.fib w/ RVR HR 101 in A.fib this AM. No longer endorsing emesis - F/u ECHO - Wean off dilt drip, c/w diltiazem CD PO 120mg  after off drip - C/w metoprolol succinate ER 100mg  BID, Eliquis 2.5mg  BID  AKI on CKD3 2/2 most likely dehydration Baseline creatinine 1.35. Creatinine 1.82 on admission - C/w IV NS 75cc/hr - Trend BMP  Dispo: Anticipated discharge in approximately 3 day(s).   DVT prophx: Home eliquis Diet: Regular Bowel: Miralax, Senokot Code: DNR  Mosetta Anis, MD 02/13/2018, 4:06 PM Pager: [phone]

## 2018-02-13 NOTE — Consult Note (Addendum)
Neurology Consultation  Reason for Consult: Acute onset of altered mental status Referring Physician: Dr. Ranae Pila response team  CC: Altered mental status  History is obtained from: Chart, primary team, RN  HPI: [name] is a 72 y.o. male with a past medical history significant for COPD on home oxygen 3 L, atrial fibrillation with RVR for which he is admitted, currently on Eliquis, hypertension, history of DVT and multiple arterial and venous thrombi in the past, status post pacemaker, nonischemic cardiomyopathy, lung nodule, hyperlipidemia, chronic kidney disease stage III, bipolar disorder, admitted for A. fib with RVR and was transitioned from his IV rate control medications to p.o. rate control medications when he went into A. fib with RVR again tonight, and upon receiving his regularly scheduled Percocets, became acutely altered and unresponsive. Rapid response team was called.  Upon initial evaluation, he was sitting in his chair, unresponsive and then opened his eyes with almost a blank stare.  He was prior to this episode complaining of shortness of breath and difficulty breathing and his oxygen saturations were on the 90s.  He was given Narcan and started to come around. Code stroke was called because of sudden onset of change in mentation as well as probable initial concern of right-sided weakness. Upon my arrival to the patient's room, he was drowsy, easily awakened to voice, follows all commands with no focal deficits but generalized weakness and bilateral lower extremity drift. He continued to complain of chest pain, chest discomfort, shortness of breath and difficulty breathing.  No witnessed seizure activity. Code stroke was canceled because of multiple other reasons that would explain the current altered mental status but recommendations were made to pursue brain imaging because of atrial fibrillation and anticoagulation. Of note in 2017 he had a similar episode when he was  admitted for sepsis and there was some concern for seizure-like activity but it was deemed that he came out of it and was not started on any antiepileptics- his presentation was determined to be secondary to toxic metabolic encephalopathy at the time.  LKW: 11 PM on 02/13/2018 tpa given?: no, received Eliquis a few hours prior to activation of code stroke Premorbid modified Rankin scale (mRS): 4  ROS: ROS was performed and is negative except as noted in the HPI.   Past Medical History:  Diagnosis Date  . Alcohol abuse   . Anxiety   . Asbestos exposure   . Atrial fibrillation with RVR ([place]) 01/17/2014  . Avascular necrosis of bones of both hips (HCC)    Status post bilateral hip replacements  . Bipolar disorder ([place])   . Chronic systolic heart failure ([place])    a. NICM - EF 35% with normal cors 2003. b. Last echo 11/2012 - EF 25-30%.  . CKD (chronic kidney disease), stage III ([place])    Renal U/S 12/04/2009 showed no pathological findings. Labs 12/04/2009 include normal ESR, C3, C4; neg ANA; SPEP showed nonspecific increase in the alpha-2 region with no M-spike; UPEP showed no monoclonal free light chains; urine IFE showed polyclonal increase in feree Kappa and/or free Lambda light chains. Baseline Cr reported 1.7-2.5.  [name] COPD (chronic obstructive pulmonary disease) ([place])   . Depression   . DVT (deep venous thrombosis) ([place])    He has hypercoagulability with multiple prior DVTs  . E coli bacteremia   . Erectile dysfunction   . Failure to thrive (0-17) 08/2016   DEHYDRATION  . Gastritis   . GERD (gastroesophageal reflux disease)   . HCAP (  healthcare-associated pneumonia) 08/24/2015  . Hydrocele, unspecified   . Hyperlipemia   . Hypertension   . 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  Thyroid scan 08/28/2009 was normal with no focal areas of abnormal increased or decreased activity seen; the uptake of I 131 sodium iodide at 24 hours was 5.7%.  TSH and  free T4 normalized by 08/16/2009.  . Intestinal obstruction ([place])   . Lung nodule    Chest CT scan on 12/14/2008 showed a nodular opacity at the left lung base felt to most likely represent scarring.  Follow-up chest CT scan on 06/02/2010 showed parenchymal scarring in the left apex, left  lower lobe, and lingula; some of this scarring at the left base had a nodular appearance, unchanged. Chest 09/2012 - stable.  [name] NICM (nonischemic cardiomyopathy) ([place])   . Noncompliance   . On home oxygen therapy    "3L periodically" (08/24/2015)  . Osteoarthritis   . Permanent atrial fibrillation ([place])    a. failed ablation in 2010 due to inability to access/venous occlusions. b. failed multaq, amiodarone, tikosyn. c. has not been felt to be a good candidate for AVN ablation due to recurrent infections/bacteremia history.  . Pleural plaque    H/o asbestos exposure. Chest CT on 06/02/2010 showed stable extensive calcified pleural plaques involving the left hemithorax, consistent with asbestos related pleural disease.  . Portal vein thrombosis   . Protein calorie malnutrition ([place]) 04/2016  . Spondylosis   . STEMI (ST elevation myocardial infarction) ([place]) 11/94/1740   a. felt embolic in nature due to noncompliance with anticoagulation, LHC not pursued.  . 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 b. St[employer] Jude gen change 2010.  [name] Thrombocytopenia ([place])   . 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  . Weight loss    Normal colonoscopy by Dr. Olevia Perches on 11/06/2010.    Family History  Problem Relation Age of Onset  . Hypertension Mother   . Asthma Mother   . Coronary artery disease Mother   . Cancer Mother   . Stroke Mother   . Cancer Brother        Unsure of type.  [name] Heart disease Brother   . Heart attack Brother   . Heart attack Sister   . Colon cancer Neg Hx    . Lung cancer Neg Hx   . Prostate cancer Neg Hx    Social History:   reports that he has been smoking cigarettes. He started smoking about 58 years ago. He has a 13.50 pack-year smoking history. He uses smokeless tobacco. He reports that he has current or past drug history. Drug: Marijuana. He reports that he does not drink alcohol.  Medications  Current Facility-Administered Medications:  .  acetaminophen (TYLENOL) tablet 650 mg, 650 mg, Oral, Q6H PRN **OR** acetaminophen (TYLENOL) suppository 650 mg, 650 mg, Rectal, Q6H PRN, Santos-Sanchez, Idalys, MD .  apixaban (ELIQUIS) tablet 2.5 mg, 2.5 mg, Oral, BID, Santos-Sanchez, Idalys, MD, 2.5 mg at 02/13/18 2134 .  aspirin chewable tablet 81 mg, 81 mg, Oral, Daily, Santos-Sanchez, Idalys, MD, 81 mg at 02/13/18 0959 .  buPROPion [facility] SR) 12 hr tablet 150 mg, 150 mg, Oral, BID, Santos-Sanchez, Idalys, MD, 150 mg at 02/13/18 2134 .  diltiazem (CARDIZEM) 100 mg in dextrose 5% 14m (1 mg/mL) infusion, 5-15 mg/hr, Intravenous, Titrated, Santos-Sanchez, Idalys, MD, Last Rate: 5  mL/hr at 02/13/18 2258, 5 mg/hr at 02/13/18 2258 .  dronabinol (MARINOL) capsule 10 mg, 10 mg, Oral, BID AC, Santos-Sanchez, Idalys, MD, 10 mg at 02/13/18 1620 .  feeding supplement (ENSURE ENLIVE) (ENSURE ENLIVE) liquid 237 mL, 237 mL, Oral, TID BM, Santos-Sanchez, Idalys, MD, 237 mL at 02/13/18 1341 .  gi cocktail (Maalox,Lidocaine,Donnatal), 30 mL, Oral, TID PRN, Welford Roche, MD .  hydrOXYzine (ATARAX/VISTARIL) tablet 10 mg, 10 mg, Oral, Once, Welford Roche, MD .  Derrill Memo ON 02-15-18] Influenza vac split quadrivalent PF (FLUZONE HIGH-DOSE) injection 0.5 mL, 0.5 mL, Intramuscular, Tomorrow-1000, Butcher, Elizabeth A, MD .  levalbuterol (XOPENEX) nebulizer solution 0.63 mg, 0.63 mg, Nebulization, Q6H PRN, [name], MD, 0.63 mg at 02/13/18 2304 .  linaclotide (LINZESS) capsule 145 mcg, 145 mcg, Oral, QAC breakfast, Santos-Sanchez, Idalys,  MD, 145 mcg at 02/13/18 1120 .  MEDLINE mouth rinse, 15 mL, Mouth Rinse, BID, [name], MD .  metoprolol succinate (TOPROL-XL) 24 hr tablet 100 mg, 100 mg, Oral, BID, Santos-Sanchez, Idalys, MD, 100 mg at 02/13/18 2134 .  ondansetron (ZOFRAN) tablet 4 mg, 4 mg, Oral, Q6H PRN **OR** ondansetron (ZOFRAN) injection 4 mg, 4 mg, Intravenous, Q6H PRN, Santos-Sanchez, Idalys, MD, 4 mg at 02/13/18 2021 .  oxyCODONE-acetaminophen (PERCOCET/ROXICET) 5-325 MG per tablet 2 tablet, 2 tablet, Oral, Q6H PRN, Welford Roche, MD, 2 tablet at 02/13/18 2133 .  pantoprazole (PROTONIX) EC tablet 40 mg, 40 mg, Oral, BID, Santos-Sanchez, Idalys, MD, 40 mg at 02/13/18 2134 .  polyethylene glycol (MIRALAX / GLYCOLAX) packet 17 g, 17 g, Oral, Daily, Santos-Sanchez, Idalys, MD, 17 g at 02/13/18 1339 .  rosuvastatin (CRESTOR) tablet 20 mg, 20 mg, Oral, Daily, Santos-Sanchez, Idalys, MD, 20 mg at 02/13/18 0959 .  senna-docusate (Senokot-S) tablet 2 tablet, 2 tablet, Oral, BID, Welford Roche, MD, 2 tablet at 02/13/18 2134 .  sodium chloride flush (NS) 0.9 % injection 3 mL, 3 mL, Intravenous, Q12H, Santos-Sanchez, Idalys, MD, 3 mL at 02/13/18 1001   Exam: Current vital signs: BP (!) 143/100 (BP Location: Right Arm)   Pulse (!) 127 Comment: RN notified  Temp 98.6 F (37 C) (Axillary)   Resp 16   Ht 6' 3" (1.905 m)   Wt 55.5 kg   SpO2 98%   BMI 15.29 kg/m  Vital signs in last 24 hours: Temp:  [97.8 F (36.6 C)-98.6 F (37 C)] 98.6 F (37 C) (09/27 2004) Pulse Rate:  [37-127] 127 (09/27 2004) Resp:  [15-26] 16 (09/27 1211) BP: (99-143)/(65-109) 143/100 (09/27 2004) SpO2:  [92 %-100 %] 98 % (09/27 2304) Weight:  [55.5 kg] 55.5 kg (09/27 0356) General: Cachectic looking man in some respiratory distress. HEENT: Normocephalic, atraumatic, dry oral mucous membranes Lungs: Scattered wheezing, scattered rales, increased work of breathing Cardiovascular: Irregularly irregular  tachycardic Abdomen: Nondistended nontender Extremities: Changes of chronic venous stasis seen Neurological examination Patient is drowsy, awakens to voice, able to tell his name. His speech is mildly dysarthric. Poor attention concentration Follows simple commands consistently but unable to follow multistep commands. Unable to reliably determine his if he is able to name, comprehend and repeat consistently. Cranial nerves: Pupils are equal round reactive light, extraocular movements are intact, visual fields are full, face appears symmetric, tongue midline. Motor exam: Antigravity with no drift in bilateral upper extremities although this exam is marred by poor attention concentration both legs are barely antigravity but cannot sustain above the bed and both legs fall down before 5 seconds. Sensory exam: Intact light touch all over  Coordination: Was unable to perform cerebellar testing in the either of the extremities Gait testing was deferred at this time  NIHSS 1a Level of Conscious.: 1 1b LOC Questions: 0 1c LOC Commands: 0 2 Best Gaze: 0 3 Visual: 0 4 Facial Palsy: 0 5a Motor Arm - left: 0 5b Motor Arm - Right: 0 6a Motor Leg - Left: 2 6b Motor Leg - Right: 2 7 Limb Ataxia: 0 8 Sensory: 0 9 Best Language: 0 10 Dysarthria: 1 11 Extinct. and Inatten.: 0 TOTAL: 6  Labs I have reviewed labs in epic and the results pertinent to this consultation are: Today's labs show leukocytosis that is relatively worsened than the prior labs as well as worsening renal function from the admission labs. CBC    Component Value Date/Time   WBC 14.6 (H) 02/13/2018 0414   RBC 5.39 02/13/2018 0414   HGB 16.1 02/13/2018 0414   HGB 14.9 01/27/2018 1648   HCT 50.6 02/13/2018 0414   HCT 44.4 01/27/2018 1648   PLT 174 02/13/2018 0414   PLT 178 01/27/2018 1648   MCV 93.9 02/13/2018 0414   MCV 91 01/27/2018 1648   MCH 29.9 02/13/2018 0414   MCHC 31.8 02/13/2018 0414   RDW 14.6 02/13/2018 0414    RDW 16.3 (H) 01/27/2018 1648   LYMPHSABS 2.4 01/27/2018 1648   MONOABS 0.7 11/18/2017 1620   EOSABS 0.7 (H) 01/27/2018 1648   BASOSABS 0.0 01/27/2018 1648   CMP     Component Value Date/Time   NA 145 02/13/2018 0414   NA 145 (H) 01/27/2018 1648   K 3.6 02/13/2018 0414   CL 108 02/13/2018 0414   CO2 23 02/13/2018 0414   GLUCOSE 146 (H) 02/13/2018 0414   BUN 26 (H) 02/13/2018 0414   BUN 13 01/27/2018 1648   CREATININE 1.82 (H) 02/13/2018 0414   CREATININE 1.33 08/10/2014 1221   CALCIUM 9.3 02/13/2018 0414   PROT 6.7 02/11/2018 2300   PROT 7.0 06/16/2015 1624   ALBUMIN 3.7 02/11/2018 2300   ALBUMIN 4.0 07/19/2015 1546   AST 20 02/05/2018 2300   ALT 8 01/25/2018 2300   ALKPHOS 62 02/05/2018 2300   BILITOT 0.8 02/02/2018 2300   BILITOT 0.8 06/16/2015 1624   GFRNONAA 35 (L) 02/13/2018 0414   GFRNONAA 54 (L) 08/10/2014 1221   GFRAA 40 (L) 02/13/2018 0414   GFRAA 62 08/10/2014 1221   Imaging I have reviewed the images obtained: CT-scan of the brain-pending at the time of this dictation CXR from today shows left fibrothorax, left hemithorax volume loss with chronic pleural-parenchymal scarring at the left costophrenic angle.  Left retrocardiac consolidation that appears worsened concerning for aspiration pneumonia.  Assessment:  72 year old F American man with a very extensive past medical history that includes COPD on 3 L home oxygen, atrial fibrillation with RVR currently on Eliquis, nonischemic cardia myopathy, status post pacemaker placement, lung nodule, hyperlipidemia, chronic kidney disease stage III, anxiety, depression, bipolar disorder, admitted for A. fib with RVR management, was transitioned from his IV rate control medications to p.o. medications today but went into A. fib with RVR again and started complaining of chest discomfort, breathing difficulties as well as then became unresponsive after receiving his scheduled opiates. Narcan was given after which he started  to come around but continued to be in significant amount of respiratory distress. Primary team has reached out to pulmonary critical care medicine, who have recommended that medical management per the primary team be continued as the patient is DNR/DNI.  I would attribute his alteration of awareness to toxic metabolic encephalopathy due to to multiple causes. Because of the fact that he is on an anticoagulant, I would like to obtain some sort of blood imaging and he stable enough to be transported to the [place] scanner. His exam is totally nonfocal, and for those reasons I have counseled the acute code stroke. He is unable to receive TPA as he just received his Eliquis. His exam is not consistent with a LVO, hence not a candidate for endovascular thrombectomy.  Impression: Likely toxic metabolic encephalopathy-multifactorial Evaluate for aspiration pneumonia Evaluate for underlying abdominal infection Rule out intracranial pathology including evolving ischemic stroke versus intracranial hemorrhage COPD A. fib with RVR CHF Low suspicion for seizure  Recommendations: -Correction of toxic metabolic derangements per primary team as you are. -Check ammonia level along with all the other labs along with Chest x-ray, abdominal x-ray, urinalysis -CT head when patient stable enough to go for a noncontrast CT of the head-to evaluate for any evidence of ICH versus ischemic stroke involving. -Consider EEG if his mentation remains poor even after correction of toxic metabolic derangements.  Call neurology with questions as needed.  -- Amie Portland, MD [facility] Pager: [phone] If 7pm to 7am, please call on call as listed on AMION.  CRITICAL CARE ATTESTATION This patient is critically ill and at significant risk of neurological worsening, death and care requires constant monitoring of vital signs, hemodynamics, respiratory, and cardiac monitoring. I spent 45  minutes of neurocritical  care time performing neurological assessment, discussion with family, other specialists and medical decision making of high complexity in the care of  this patient.

## 2018-02-13 NOTE — ED Notes (Signed)
Pt calling out multiple times requesting medication or relief from significant itching. Levada Dy, RN notified and informed this EMT she was aware of the request, just waiting on the provider.

## 2018-02-13 NOTE — Progress Notes (Signed)
Paged for patient having atrial fibrillation with RVR earlier this evening after being weaned off of overnight cardizem drip. 10mg  metoprolol was given twice with decrease in HR to 140s. Home dose of toprol 100mg  was given and cardizem drip was then restarted. He was given his usual 10mg  percocet for hip and bilateral leg pain.  Around 2230 IMTS was again paged as patient became unresponsive, hypotensive and continuing to have afib with RVR. Neuro was consulted and code stroke initiated but cancelled as he began to rouse and was further rousable after administration of narcan. He followed commands but was not completely responsive, remained confused, and non-rebreather mask was required to keep his saturation >89%. He continued to have bp 70/50s even after 1.5L NS bolus, and to request opioid pain medication as he had chest pain.  PCCM was consulted. Case was further discussed with PCCM and family at bedside, and the family were updated and understood that his condition was unstable.   Chest xray was ordered and unchanged from previous. Abd xr showed increased dilation of bowels with impacted stool. Disimpaction attempted without release of stool, but patient shortly afterward had an independent bloody bowel movement.   Trop: .03   CBC Latest Ref Rng & Units 11-Mar-2018 02/13/2018 02/07/2018  WBC 4.0 - 10.5 K/uL 16.7(H) 14.6(H) 13.2(H)  Hemoglobin 13.0 - 17.0 g/dL 13.3 16.1 16.5  Hematocrit 39.0 - 52.0 % 43.3 50.6 53.6(H)  Platelets 150 - 400 K/uL 163 174 195   BMP Latest Ref Rng & Units Mar 11, 2018 02/13/2018 02/05/2018  Glucose 70 - 99 mg/dL 217(H) 146(H) 150(H)  BUN 8 - 23 mg/dL 35(H) 26(H) 21  Creatinine 0.61 - 1.24 mg/dL 2.23(H) 1.82(H) 1.58(H)  BUN/Creat Ratio 10 - 24 - - -  Sodium 135 - 145 mmol/L 140 145 145  Potassium 3.5 - 5.1 mmol/L 4.0 3.6 3.7  Chloride 98 - 111 mmol/L 107 108 107  CO2 22 - 32 mmol/L 12(L) 23 22  Calcium 8.9 - 10.3 mg/dL 8.4(L) 9.3 9.1    Plan:  - lactic acid ordered   - further discussion with family, at this point they are focusing more on patient's comfort and would like to pursue comfort care if he continues to decline  - trend troponin

## 2018-02-13 NOTE — Progress Notes (Signed)
Initial Nutrition Assessment  DOCUMENTATION CODES:   Severe malnutrition in context of chronic illness, Underweight  INTERVENTION:    Ensure Enlive po BID, each supplement provides 350 kcal and 20 grams of protein  Boost Breeze po TID, each supplement provides 250 kcal and 9 grams of protein  NUTRITION DIAGNOSIS:   Severe Malnutrition related to chronic illness(COPD, heart failure) as evidenced by severe fat depletion, moderate muscle depletion  GOAL:   Patient will meet greater than or equal to 90% of their needs  MONITOR:   PO intake, Supplement acceptance, Labs, Skin, Weight trends, I & O's  REASON FOR ASSESSMENT:   Consult Malnutrition Eval  ASSESSMENT:   72 y.o. Male with hx of alcohol abuse, anxiety, asbestos exposure, AFIB, bipolar disorder, CHF, CKD, COPD, depression, hx of DVT on xarelto, ED, GERD, hyperthyroidism, weight loss and malnutrition, presenting to the ED with multiple concerns  Patient admitted with Atrial fibrillation with RVR ([facility]) [I48.91] Hematemesis with nausea [K92.0]   He is known to Clinical Nutrition Team from previous hospitalizations. RD spoke with patient at bedside. His family is present. Reports he has no appetite. He typically consumes 1 meal per day.  He does drink Ensure supplements at home. Also likes [employer]. Palliative Medicine Team consulted for goals of care. Labs & medications reviewed.  Patient shares his appetite improves in the hospital when he is on Prednisone.   NUTRITION - FOCUSED PHYSICAL EXAM:    Most Recent Value  Orbital Region  Severe depletion  Upper Arm Region  Severe depletion  Thoracic and Lumbar Region  Unable to assess  Buccal Region  Severe depletion  Temple Region  Severe depletion  Clavicle Bone Region  Severe depletion  Clavicle and Acromion Bone Region  Severe depletion  Scapular Bone Region  Unable to assess  Dorsal Hand  Severe depletion  Patellar Region  Severe depletion  Anterior  Thigh Region  Severe depletion  Posterior Calf Region  Severe depletion  Edema (RD Assessment)  None     Diet Order:   Diet Order            Diet regular Room service appropriate? Yes; Fluid consistency: Thin  Diet effective now             EDUCATION NEEDS:   No education needs have been identified at this time  Skin:  Skin Assessment: Reviewed RN Assessment  Last BM:  9/27  Height:   Ht Readings from Last 1 Encounters:  02/13/18 6\' 3"  (1.905 m)   Weight:   Wt Readings from Last 1 Encounters:  02/13/18 55.5 kg   BMI:  Body mass index is 15.29 kg/m.  Estimated Nutritional Needs:   Kcal:  1600-1800  Protein:  75-90 gm  Fluid:  1.6-1.8 L  Arthur Holms, RD, LDN Pager #: [phone] After-Hours Pager #: [phone]

## 2018-02-13 NOTE — Progress Notes (Signed)
PT Cancellation Note  Patient Details Name: [name] MRN: [id number] DOB: 1945-08-06   Cancelled Treatment:    Reason Eval/Treat Not Completed: Pain limiting ability to participate; patient reports pain in back and legs.  Agreeable to try later.  Will return today if time permits.    Reginia Naas 02/13/2018, 1:15 PM  Magda Kiel, [place] [phone] 02/13/2018

## 2018-02-13 NOTE — Progress Notes (Signed)
  Echocardiogram 2D Echocardiogram has been performed.  [name] 02/13/2018, 8:51 AM

## 2018-02-14 ENCOUNTER — Inpatient Hospital Stay (HOSPITAL_COMMUNITY): Payer: Medicare Other

## 2018-02-14 DIAGNOSIS — I4891 Unspecified atrial fibrillation: Secondary | ICD-10-CM

## 2018-02-14 LAB — BASIC METABOLIC PANEL
ANION GAP: 21 — AB (ref 5–15)
BUN: 35 mg/dL — ABNORMAL HIGH (ref 8–23)
CO2: 12 mmol/L — ABNORMAL LOW (ref 22–32)
Calcium: 8.4 mg/dL — ABNORMAL LOW (ref 8.9–10.3)
Chloride: 107 mmol/L (ref 98–111)
Creatinine, Ser: 2.23 mg/dL — ABNORMAL HIGH (ref 0.61–1.24)
GFR calc Af Amer: 32 mL/min — ABNORMAL LOW (ref >60)
GFR calc non Af Amer: 27 mL/min — ABNORMAL LOW (ref >60)
GLUCOSE: 217 mg/dL — AB (ref 70–99)
POTASSIUM: 4 mmol/L (ref 3.5–5.1)
Sodium: 140 mmol/L (ref 135–145)

## 2018-02-14 LAB — CBC
HEMATOCRIT: 43.3 % (ref 39.0–52.0)
HEMOGLOBIN: 13.3 g/dL (ref 13.0–17.0)
MCH: 30 pg (ref 26.0–34.0)
MCHC: 30.7 g/dL (ref 30.0–36.0)
MCV: 97.7 fL (ref 78.0–100.0)
Platelets: 163 10*3/uL (ref 150–400)
RBC: 4.43 MIL/uL (ref 4.22–5.81)
RDW: 14.6 % (ref 11.5–15.5)
WBC: 16.7 10*3/uL — ABNORMAL HIGH (ref 4.0–10.5)

## 2018-02-14 LAB — TROPONIN I: Troponin I: 0.03 ng/mL (ref <0.03)

## 2018-02-14 LAB — AMMONIA: AMMONIA: 60 umol/L — AB (ref 9–35)

## 2018-02-14 LAB — LACTIC ACID, PLASMA: LACTIC ACID, VENOUS: 10.6 mmol/L — AB (ref 0.5–1.9)

## 2018-02-14 MED ORDER — HYDROMORPHONE HCL 1 MG/ML IJ SOLN
1.0000 mg | INTRAMUSCULAR | Status: DC | PRN
  Administered 2018-02-14: 1 mg via INTRAVENOUS
  Filled 2018-02-14: qty 1

## 2018-02-14 MED ORDER — MORPHINE SULFATE (CONCENTRATE) 10 MG/0.5ML PO SOLN: 5.0000 mg | ORAL | Status: DC | PRN

## 2018-02-14 MED ORDER — IPRATROPIUM-ALBUTEROL 0.5-2.5 (3) MG/3ML IN SOLN
3.0000 mL | Freq: Once | RESPIRATORY_TRACT | Status: AC
  Administered 2018-02-14: 3 mL via RESPIRATORY_TRACT
  Filled 2018-02-14: qty 3

## 2018-02-14 MED ORDER — FLEET ENEMA 7-19 GM/118ML RE ENEM: 1.0000 | ENEMA | Freq: Once | RECTAL | Status: DC

## 2018-02-14 MED ORDER — MILK AND MOLASSES ENEMA
1.0000 | Freq: Once | RECTAL | Status: DC
  Filled 2018-02-14: qty 250

## 2018-02-14 MED ORDER — MORPHINE SULFATE (CONCENTRATE) 10 MG/0.5ML PO SOLN
5.0000 mg | ORAL | Status: DC | PRN
  Administered 2018-02-14: 5 mg via SUBLINGUAL
  Filled 2018-02-14: qty 0.5

## 2018-02-16 NOTE — Telephone Encounter (Signed)
Pt deceased

## 2018-02-17 NOTE — Progress Notes (Signed)
[name] was admitted last night with several medical complaints including generalized abdominal pain associated with nausea and vomiting secondary to constipation, chronic generalized weakness, and shortness of breath secondary to end-stage COPD. In the ED he was found to be in Atrial fibrillation with RVR and he was started on Cardizem. He was also started on an aggressive bowel regimen for his opioid-induced constipation.   Yesterday (February 23, 2018), he was transitioned to PO Cardizem with adequate HR control. At around 10pm last night he went into Afib with RVR again. Two doses of IV metoprolol 10 mg were ordered to be administered within 5 minutes of each other. Patient's HR remained > 160 and Cardizem gtt was started again.   At 2330 last night, we received a paged from patient's RN stating he was unresponsive. We evaluated patient at bedside. Rapid response team was at bedside. They consulted neurology and called code stroke given sudden change in mental status. The patient was lying in bed with his eyes opened, but was not following commands. He was also non-verbal. His pupils were pinpoint bilaterally. HR 140s and BP 80/60s. He received one dose of Narcan due to concern for opioid overdose after which his mental status quickly improved. Cardizem gtt was stopped and 500cc NS bolus was initiated due to hypotension. Code stroke was cancelled. Patient's family was called x2 for updates, they confirmed patient is DNR/DNI but FULL SCOPE OF CARE (would like vasopressors, IV medications and IVF).  He remained hypotensive after initial bolus and an additional 1L NS bolus was ordered. At this time, patient started complaining of severe chest pain. EKG showed afib with RVR without signs of acute ischemia. Troponin was ordered as well. Chest and abdominal XR were ordered for further evaluation. CXR was similar to prior and without focal consolidations. Abdominal XR did show worsening colonic dilation as well as  fecal impaction. PCCM was consulted for vasopressors due to persistent hypotension unresponsive to IVF. PCCM evaluated patient at bedside, they recommended enema and a neosynephrine push.   We placed an ordered for disimpaction which was attempted by two RNs unsuccessfully as impaction was upstream. Milk and molassess enema was then ordered. However, patient had an episode of bowel incontinence with a significant amount of bloody stools. At this point, patient remained in RVR and hypotensive with BP 70-80/50-60s and MAP 50s. Lactic acid was 10.5. This was highly concerning for ischemic colitis given chronic constipation with worsening colonic dilation on imaging and new bloody stools. I had a conversation with both patient's son and daughter regarding goals of care. We discussed that given [name] comorbidities, extremely poor baseline function, and acute illness, he would not be a good candidate for aggressive interventions as these would only prolonged life but not improve his quality of life. Patient's daughter [name]) and son stated they would like [place] to focus on comfort as they do not want [name] to be in pain. Patient was made full comfort care and died 02-23-2018 at 0644.   Welford Roche, MD  Internal Medicine PGY-2  P [phone]

## 2018-02-17 NOTE — Plan of Care (Signed)
  Problem: Education: Goal: Knowledge of General Education information will improve Description Including pain rating scale, medication(s)/side effects and non-pharmacologic comfort measures Outcome: Not Progressing   Problem: Clinical Measurements: Goal: Ability to maintain clinical measurements within normal limits will improve Outcome: Not Progressing Goal: Respiratory complications will improve Outcome: Not Progressing   Problem: Nutrition: Goal: Adequate nutrition will be maintained Outcome: Not Progressing   Problem: Elimination: Goal: Will not experience complications related to bowel motility Outcome: Not Progressing

## 2018-02-17 NOTE — Progress Notes (Signed)
Death pronounced by Teola Bradley, RN  At [phone] and Doree Albee, RN verified. IMTS notified of death, family notified. IVs removed. Patient made comfortable for family visit.

## 2018-02-17 NOTE — Progress Notes (Addendum)
2020: afib RVR - HR 170's. IMTS notified. Order for 5 mg metoprolol IV. Scheduled metoprolol given with pain meds. HR remained in 140's.   Respiratory called @2134  for breathing treatment. Respiratory notified this RN that the patient felt that he was going to pass out after breathing treatment.  Called rapid response. Patient found slumped in chair with blank stare and non verbal. Patient was moved from chair to bed. Blood pressures low. Patient placed on nonrebreather. Bolus given with no results. IMTS at bedside. Code stroke called but later canceled by neuro. Patient given narcan and patient became more alert. PCCM consulted. HR remained in 140's, b/p's remained low.   Order for disimpaction - unsuccessful. Order for fleets. Patient told this RN that fleets do not work and he needed the sticky enema. Molasses-milk enema ordered. Patient was found to have copious amount of blood from rectum. Patient was cleaned up and linen changed.    Lactic acid ordered - resulted 10.6. Family decided to make patient comfort care.  Vitals, tele, daily wts discontinued. Patient given one dose of morphine with no relief. Patient was given IV dilaudid. NT cleaned patient up and gave him some apple juice. Shortly after, co-worker noticed that the patient was not breathing.

## 2018-02-17 NOTE — Progress Notes (Signed)
[facility]  Spoke with April Shore Referral Number [phone]

## 2018-02-17 NOTE — Consult Note (Signed)
..   NAME:  [name], MRN:  [id number], DOB:  April 19, 1946, LOS: 1 ADMISSION DATE:  02/07/2018, CONSULTATION DATE:  2018/02/22 REFERRING MD:  Alfonse Spruce MD/ FP RESIDENTS, CHIEF COMPLAINT:  HYPOTENSION   Brief History   72 yr old male with PMHx COPD, Afib on Eliquis, failure to thrive and   Chronic colonic ileus with unintentional weight loss. Full DNR prior to admission PCCM consulted for vasopressors.  GOAL OF CARE CONVERSATION:  Discussed with son who is NOK regarding code status and to clarify pt is a full DNR and would not want vasoactive infusion, ACLs medications, intubation or CPR. I relayed this information to the primary team. PLAN:  Pt was previously on cardizem IV and PO,also Metoprololand became hypotensive-  hold antihypertensives and rate control for now. Rx pt's constipation is actively in pain- recommend enema. Consult Palliative Care to assist in following patient's wishes   Objective   Blood pressure (!) 81/62, pulse (!) 127, temperature 98.6 F (37 C), temperature source Axillary, resp. rate 16, height 6\' 3"  (1.905 m), weight 55.5 kg, SpO2 98 %.        Intake/Output Summary (Last 24 hours) at 02-22-2018 0258 Last data filed at 02/22/18 0105 Gross per 24 hour  Intake 747.5 ml  Output 200 ml  Net 547.5 ml   Filed Weights   02/13/18 0356  Weight: 55.5 kg    Examination: General: cachetic male in discomfort HENT: normocephalic atraumatic with dry oral mucosa and NRB mask Lungs: decreased breath sounds b/l Cardiovascular: S1 and S2 increased rate Abdomen: soft distended abdomen diffusely tender Extremities: thin extremities no edema muscle wasting Neuro: AAOx 4 in discomfort due to constipation   Disposition / Summary of Today's Plan 2018/02/22   Stays on current level of care with FP service Consult Palliative Care    Diet: thin liquids Pain/Anxiety/Delirium protocol (if indicated): prn pain medications - percocet prn VAP protocol (if indicated): not  indicated  DVT prophylaxis: eliquis for Afib GI prophylaxis: protonix Hyperglycemia protocol: if BG exceeds 180mg /dl start ISS Mobility: bedrest Code Status: DNR/ DNI Family Communication: discussed with son and his significant other and we clarified goals of care and wishes of the patient and what interventions would be of benefit at this juncture in his illness.  Labs   CBC: Recent Labs  Lab 02/10/2018 2300 02/13/18 0414 2018-02-22 0026  WBC 13.2* 14.6* 16.7*  HGB 16.5 16.1 13.3  HCT 53.6* 50.6 43.3  MCV 97.3 93.9 97.7  PLT 195 174 620    Basic Metabolic Panel: Recent Labs  Lab 01/25/2018 2300 02/13/18 0414 2018/02/22 0026  NA 145 145 140  K 3.7 3.6 4.0  CL 107 108 107  CO2 22 23 12*  GLUCOSE 150* 146* 217*  BUN 21 26* 35*  CREATININE 1.58* 1.82* 2.23*  CALCIUM 9.1 9.3 8.4*   GFR: Estimated Creatinine Clearance: 22.8 mL/min (A) (by C-G formula based on SCr of 2.23 mg/dL (H)). Recent Labs  Lab 02/10/2018 2300 02/13/18 0414 2018/02/22 0026  WBC 13.2* 14.6* 16.7*    Liver Function Tests: Recent Labs  Lab 01/29/2018 2300  AST 20  ALT 8  ALKPHOS 62  BILITOT 0.8  PROT 6.7  ALBUMIN 3.7   Recent Labs  Lab 02/10/2018 2300  LIPASE 28   Recent Labs  Lab 02/22/2018 0029  AMMONIA 60*    ABG    Component Value Date/Time   PHART 7.388 09/20/2015 0021   PCO2ART 47.2 (H) 09/20/2015 0021   PO2ART 110.0 (H)  09/20/2015 0021   HCO3 28.4 (H) 09/20/2015 0021   TCO2 30 09/20/2015 0021   ACIDBASEDEF 5.0 (H) 08/12/2015 0637   O2SAT 98.0 09/20/2015 0021     Coagulation Profile: No results for input(s): INR, PROTIME in the last 168 hours.  Cardiac Enzymes: Recent Labs  Lab 02/24/18 0026  TROPONINI 0.03*    HbA1C: Hgb A1c MFr Bld  Date/Time Value Ref Range Status  10/11/2014 03:26 PM 5.6 4.8 - 5.6 % Final    Comment:    (NOTE)         Pre-diabetes: 5.7 - 6.4         Diabetes: >6.4         Glycemic control for adults with diabetes: <7.0   06/01/2010 07:33 PM   <5.7 % Final   5.6 (NOTE)                                                                       According to the ADA Clinical Practice Recommendations for 2011, when HbA1c is used as a screening test:   >=6.5%   Diagnostic of Diabetes Mellitus           (if abnormal result  is confirmed)  5.7-6.4%   Increased risk of developing Diabetes Mellitus  References:Diagnosis and Classification of Diabetes Mellitus,Diabetes MOLM,7867,54(GBEEF 1):S62-S69 and Standards of Medical Care in         Diabetes - 2011,Diabetes EOFH,2197,58  (Suppl 1):S11-S61.    CBG: Recent Labs  Lab 02/13/18 2316  GLUCAP 211*    Review of Systems:   Marland KitchenMarland KitchenReview of Systems  Constitutional: Positive for malaise/fatigue and weight loss.  HENT: Negative.   Eyes: Negative.   Respiratory: Positive for shortness of breath.   Cardiovascular: Negative.   Gastrointestinal: Positive for abdominal pain and constipation.  Genitourinary: Negative.   Musculoskeletal: Negative.   Skin: Negative.   Neurological: Negative.   Endo/Heme/Allergies: Negative.   Psychiatric/Behavioral: Negative.      Past Medical History  He,  has a past medical history of Alcohol abuse, Anxiety, Asbestos exposure, Atrial fibrillation with RVR ([place]) (01/17/2014), Avascular necrosis of bones of both hips ([place]), Bipolar disorder ([place]), Chronic systolic heart failure ([place]), CKD (chronic kidney disease), stage III ([place]), COPD (chronic obstructive pulmonary disease) ([place]), Depression, DVT (deep venous thrombosis) ([place]), E coli bacteremia, Erectile dysfunction, Failure to thrive (0-17) (08/2016), Gastritis, GERD (gastroesophageal reflux disease), HCAP (healthcare-associated pneumonia) (08/24/2015), Hydrocele, unspecified, Hyperlipemia, Hypertension, Hyperthyroidism, Intestinal obstruction ([place]), Lung nodule, NICM (nonischemic cardiomyopathy) ([place]), Noncompliance, On home oxygen therapy, Osteoarthritis, Permanent atrial fibrillation ([place]), Pleural plaque, Portal vein  thrombosis, Protein calorie malnutrition ([place]) (04/2016), Spondylosis, STEMI (ST elevation myocardial infarction) ([place]) (10/11/2014), Tachycardia-bradycardia syndrome ([place]), Thrombocytopenia ([place]), Thrombosis, and Weight loss.   Surgical History    Past Surgical History:  Procedure Laterality Date  . CARDIAC CATHETERIZATION  2003   Nl cors, EF 35%  . CARDIOVERSION N/A 05/27/2013   Procedure: CARDIOVERSION;  Surgeon: Candee Furbish, MD;  Location: [facility] ENDOSCOPY;  Service: Cardiovascular;  Laterality: N/A;  . EMBOLECTOMY Right 12/10/2013   Procedure: Right Femoral Embolectomy; Fasciotomy Right Lower Leg with Intraoperative arteriogram;  Surgeon: Rosetta Posner, MD;  Location: [facility];  Service: Vascular;  Laterality: Right;  . EMBOLECTOMY Left 10/12/2014  Procedure: Left Femoral Thrombectomy with intraoperative arteriogram;  Surgeon: Rosetta Posner, MD;  Location: [facility];  Service: Vascular;  Laterality: Left;  . FASCIOTOMY CLOSURE Right 12/15/2013   Procedure: LEG FASCIOTOMY CLOSURE;  Surgeon: Rosetta Posner, MD;  Location: [facility];  Service: Vascular;  Laterality: Right;  . HEMIARTHROPLASTY HIP Left 09/09/2002   bipolar; Dr. [place] Dragon  . INSERT / REPLACE / REMOVE PACEMAKER  02/2003   Dr. Roe Rutherford, [employer] pulse generator identity SR model [id number] serial [id number]; gen change by Greggory Brandy  . INSERT / REPLACE / REMOVE PACEMAKER  06/17/2008   Lead and generator change w/ [employer] Tendril ST model [id number] (serial  [id number]).       . INTRAOPERATIVE ARTERIOGRAM Right 12/10/2013   Procedure: INTRA OPERATIVE ARTERIOGRAM;  Surgeon: Rosetta Posner, MD;  Location: [facility];  Service: Vascular;  Laterality: Right;  . JOINT REPLACEMENT    . TEE WITHOUT CARDIOVERSION N/A 05/27/2013   Procedure: TRANSESOPHAGEAL ECHOCARDIOGRAM (TEE);  Surgeon: Candee Furbish, MD;  Location[facility] ENDOSCOPY;  Service: Cardiovascular;  Laterality: N/A;  . TOTAL HIP ARTHROPLASTY Right 03/08/2008    By Dr. Hiram Comber     Social History     Social History   Socioeconomic History  . Marital status: Widowed    Spouse name: Not on file  . Number of children: 10  . Years of education: Not on file  . Highest education level: Not on file  Occupational History  . Occupation: RETIRED    Employer: RETIRED  Social Needs  . Financial resource strain: Not on file  . Food insecurity:    Worry: Not on file    Inability: Not on file  . Transportation needs:    Medical: Not on file    Non-medical: Not on file  Tobacco Use  . Smoking status: Current Some Day Smoker    Packs/day: 0.25    Years: 54.00    Pack years: 13.50    Types: Cigarettes    Start date: 13  . Smokeless tobacco: Current User  . Tobacco comment: cutting back  Substance and Sexual Activity  . Alcohol use: No    Alcohol/week: 0.0 standard drinks    Comment: History of ETOH Abuse."  . Drug use: Yes    Types: Marijuana  . Sexual activity: Not Currently  Lifestyle  . Physical activity:    Days per week: Not on file    Minutes per session: Not on file  . Stress: Not on file  Relationships  . Social connections:    Talks on phone: Not on file    Gets together: Not on file    Attends religious service: Not on file    Active member of club or organization: Not on file    Attends meetings of clubs or organizations: Not on file    Relationship status: Not on file  . Intimate partner violence:    Fear of current or ex partner: Not on file    Emotionally abused: Not on file    Physically abused: Not on file    Forced sexual activity: Not on file  Other Topics Concern  . Not on file  Social History Narrative   Retired Architect G86 yrs here in [place]   Smoker 1 pack last 1.5 days tobacco, smokes marijuana    Denies EtOH x 4 years (on 10/12/12)   9 kids    From [place]   [place] Veteran    12  grade education         ,  reports that he has been smoking cigarettes. He started smoking about 58 years ago. He has a 13.50 pack-year smoking history. He  uses smokeless tobacco. He reports that he has current or past drug history. Drug: Marijuana. He reports that he does not drink alcohol.   Family History   His family history includes Asthma in his mother; Cancer in his brother and mother; Coronary artery disease in his mother; Heart attack in his brother and sister; Heart disease in his brother; Hypertension in his mother; Stroke in his mother. There is no history of Colon cancer, Lung cancer, or Prostate cancer.   Allergies Allergies  Allergen Reactions  . Amiodarone Other (See Comments)    Thyroiditis in the setting of amiodarone use  . Tape Itching    Reaction to plastic tape - please use paper tape  . Latex Rash  . Penicillins Rash and Other (See Comments)    Has patient had a PCN reaction causing immediate rash, facial/tongue/throat swelling, SOB or lightheadedness with hypotension: Yes Has patient had a PCN reaction causing severe rash involving mucus membranes or skin necrosis: No Has patient had a PCN reaction that required hospitalization No Has patient had a PCN reaction occurring within the last 10 years: No If all of the above answers are "NO", then may proceed with Cephalosporin use.     Home Medications  Prior to Admission medications   Medication Sig Start Date End Date Taking? Authorizing Provider  albuterol (VENTOLIN HFA) 108 (90 Base) MCG/ACT inhaler Inhale 1-2 puffs into the lungs every 6 (six) hours as needed for wheezing or shortness of breath. 08/13/17  Yes Shela Leff, MD  apixaban (ELIQUIS) 2.5 MG TABS tablet Take 1 tablet (2.5 mg total) by mouth 2 (two) times daily. 11/25/17  Yes Forde Dandy, PharmD  aspirin 81 MG chewable tablet Chew 1 tablet (81 mg total) by mouth daily. 02/12/17  Yes Lenore Cordia, MD  buPROPion (WELLBUTRIN SR) 150 MG 12 hr tablet Take 1 tablet (150 mg total) by mouth 2 (two) times daily. 09/15/17  Yes Lenore Cordia, MD  diltiazem (CARDIZEM CD) 120 MG 24 hr capsule TAKE 1 CAPSULE BY  MOUTH EVERY DAY 11/24/17  Yes Sid Falcon, MD  dronabinol (MARINOL) 10 MG capsule Take 1 capsule (10 mg total) by mouth 2 (two) times daily before a meal. 12/17/17  Yes Carroll Sage, MD  linaclotide [facility]) 145 MCG CAPS capsule Take 1 capsule (145 mcg total) by mouth daily before breakfast. Patient taking differently: Take 145 mcg by mouth every other day.  12/17/17  Yes Carroll Sage, MD  metoprolol succinate (TOPROL-XL) 100 MG 24 hr tablet TAKE 1 TABLET (100 MG TOTAL) BY MOUTH EVERY 12 HOURS. TAKE WITH OR IMMEDIATELY FOLLOWING A MEAL. 12/10/17  Yes Axel Filler, MD  ondansetron (ZOFRAN-ODT) 4 MG disintegrating tablet Take 1 tablet (4 mg total) by mouth every 8 (eight) hours as needed for nausea or vomiting. 08/26/17  Yes Tawny Asal, MD  oxyCODONE-acetaminophen (PERCOCET) 10-325 MG tablet Take 1 tablet by mouth every 4 (four) hours as needed for pain. Do not take more than 6 tablets in 24 hours. 02/10/18 03/12/18 Yes Sid Falcon, MD  polyethylene glycol (MIRALAX / GLYCOLAX) packet Take 17 g by mouth 2 (two) times daily. Patient taking differently: Take 17 g by mouth daily as needed for mild constipation.  08/26/17  Yes Tawny Asal, MD  rosuvastatin (  CRESTOR) 20 MG tablet Take 1 tablet (20 mg total) by mouth daily. 10/15/17  Yes Lenore Cordia, MD  senna-docusate (SENOKOT-S) 8.6-50 MG tablet Take 2 tablets by mouth 2 (two) times daily. Patient taking differently: Take 2 tablets by mouth at bedtime as needed for mild constipation.  08/26/17  Yes Tawny Asal, MD  tiotropium (SPIRIVA) 18 MCG inhalation capsule Place 1 capsule (18 mcg total) into inhaler and inhale daily. 10/15/17  Yes Lenore Cordia, MD      I, Dr Seward Carol have personally reviewed patient's available data, including medical history, events of note, physical examination and test results as part of my evaluation. I have discussed with NP and other care providers such as pharmacist, RN and El[place].  In addition,   I personally evaluated patient The patient is critically ill with multiple organ systems failure and requires high complexity decision making for assessment and support, frequent evaluation and titration of therapies, application of advanced monitoring technologies and extensive interpretation of multiple databases.   Critical Care Time devoted to patient care services described in this note is 35 Minutes. This time reflects time of care of this signee Dr Seward Carol. This critical care time does not reflect procedure time, or teaching time or supervisory time of NP but could involve care discussion time   CC TIME: 35 minutes CODE STATUS:DNR DNI DISPOSITION: remains on SDU PROGNOSIS: Poor FAMILY: updated on clinical condition and all questions and concerns addressed  Dr. Seward Carol Pulmonary Critical Care Medicine  03/06/18 3:12 AM

## 2018-02-17 NOTE — Significant Event (Signed)
Rapid Response Event Note Called by Nursing staff for pt in Afib with RVR and acute SOB  Overview: Time Called: 2249 Arrival Time: 2310 Event Type: Cardiac, Respiratory  Initial Focused Assessment: Upon arrival, pt was sitting up in chair.  He was agonally breathing with a blank stare, nonverbal, localized to pain with the left arm, and no movement with right arm.  IMTS and Dr. Rory Percy notified.  Code stroke initiated. LSW 2300. Pt was transferred back to the bed from the chair.  HR 140-160s afib, BP 77/32, RR 14 agonal with undetectable oxygen saturations.  Narcan was administered.  After narcan, pt began speaking and following some simple commands.  Pt remains tachycardic and hypotensive while bolus infusing.  Decision was made to deter CT scan until hemodynamic stability is obtained.  Pt is DNR.  PCCM consulted by IMTS.  After consulting PCCM, IMTS met with the family and clarified GOC and code status.  Interventions: -Code stroke initiated, then later cancelled per Dr. Rory Percy -1L NS bolus and Narcan 0.4 mg per IMTS -2 new PIVs -STAT PCXR, KUB  Plan of Care (if not transferred): -disimpaction -focus on comfort and pain management  Event Summary: Call ended 0200  Madelynn Done

## 2018-02-17 DEATH — deceased

## 2018-02-18 ENCOUNTER — Other Ambulatory Visit (HOSPITAL_COMMUNITY): Payer: Medicare Other

## 2018-02-18 LAB — CULTURE, BLOOD (ROUTINE X 2)
CULTURE: NO GROWTH
Culture: NO GROWTH
SPECIAL REQUESTS: ADEQUATE

## 2018-03-20 NOTE — Death Summary Note (Signed)
  Name: [name] MRN: [id number] DOB: 1945-08-02 72 y.o.  Date of Admission: 02/13/2018 11:04 PM Date of Discharge: 02/13/2018 06:45 AM Attending Physician: Larey Dresser, MD  Discharge Diagnosis: Principal Problem:   Atrial fibrillation with RVR ([place]) Active Problems:   Severe malnutrition ([place])   Weakness generalized   Nausea and vomiting - Ischemic colitis  Cause of death: Ischemic Colitis Time of death: March 12, 2018 6:44 AM  Disposition and follow-up:   [name] was discharged from [facility] in expired condition.    Hospital Course: [name] presented with episode of new onset abdominal pain and dyspnea. Also found to have unintentional weight loss of 9lbs. Found to have LLL opacity on chest X-ray and significant constipation on AXR w/o sbo. Blood cultures were drawn and was admitted. Palliative was consulted after discussion with patient about goals of care. The night of admission, he became unresponsive with pinpoint pupils, hypotensive, and tachycardic. Family was called for goals of care. His mentation improved with Narcan, and was given normal saline bolus. Repeat AXR showed worsening colonic dilation with fecal impaction. He began to have to have new bloody stools and lactic acidosis. Patient's son and daughter decided to change him to comfort care. Soon after, [name] expired at [phone] 2018-03-12.   Signed: Mosetta Anis, MD 02/17/2018, 7:35 PM

## 2018-08-14 IMAGING — CR DG CHEST 2V
2 series · 2 of 2 positions shown · non-contrast
Comparison: 10/30/2016

CLINICAL DATA: Shortness of breath.

EXAM:
CHEST  2 VIEW

[w chest lat]
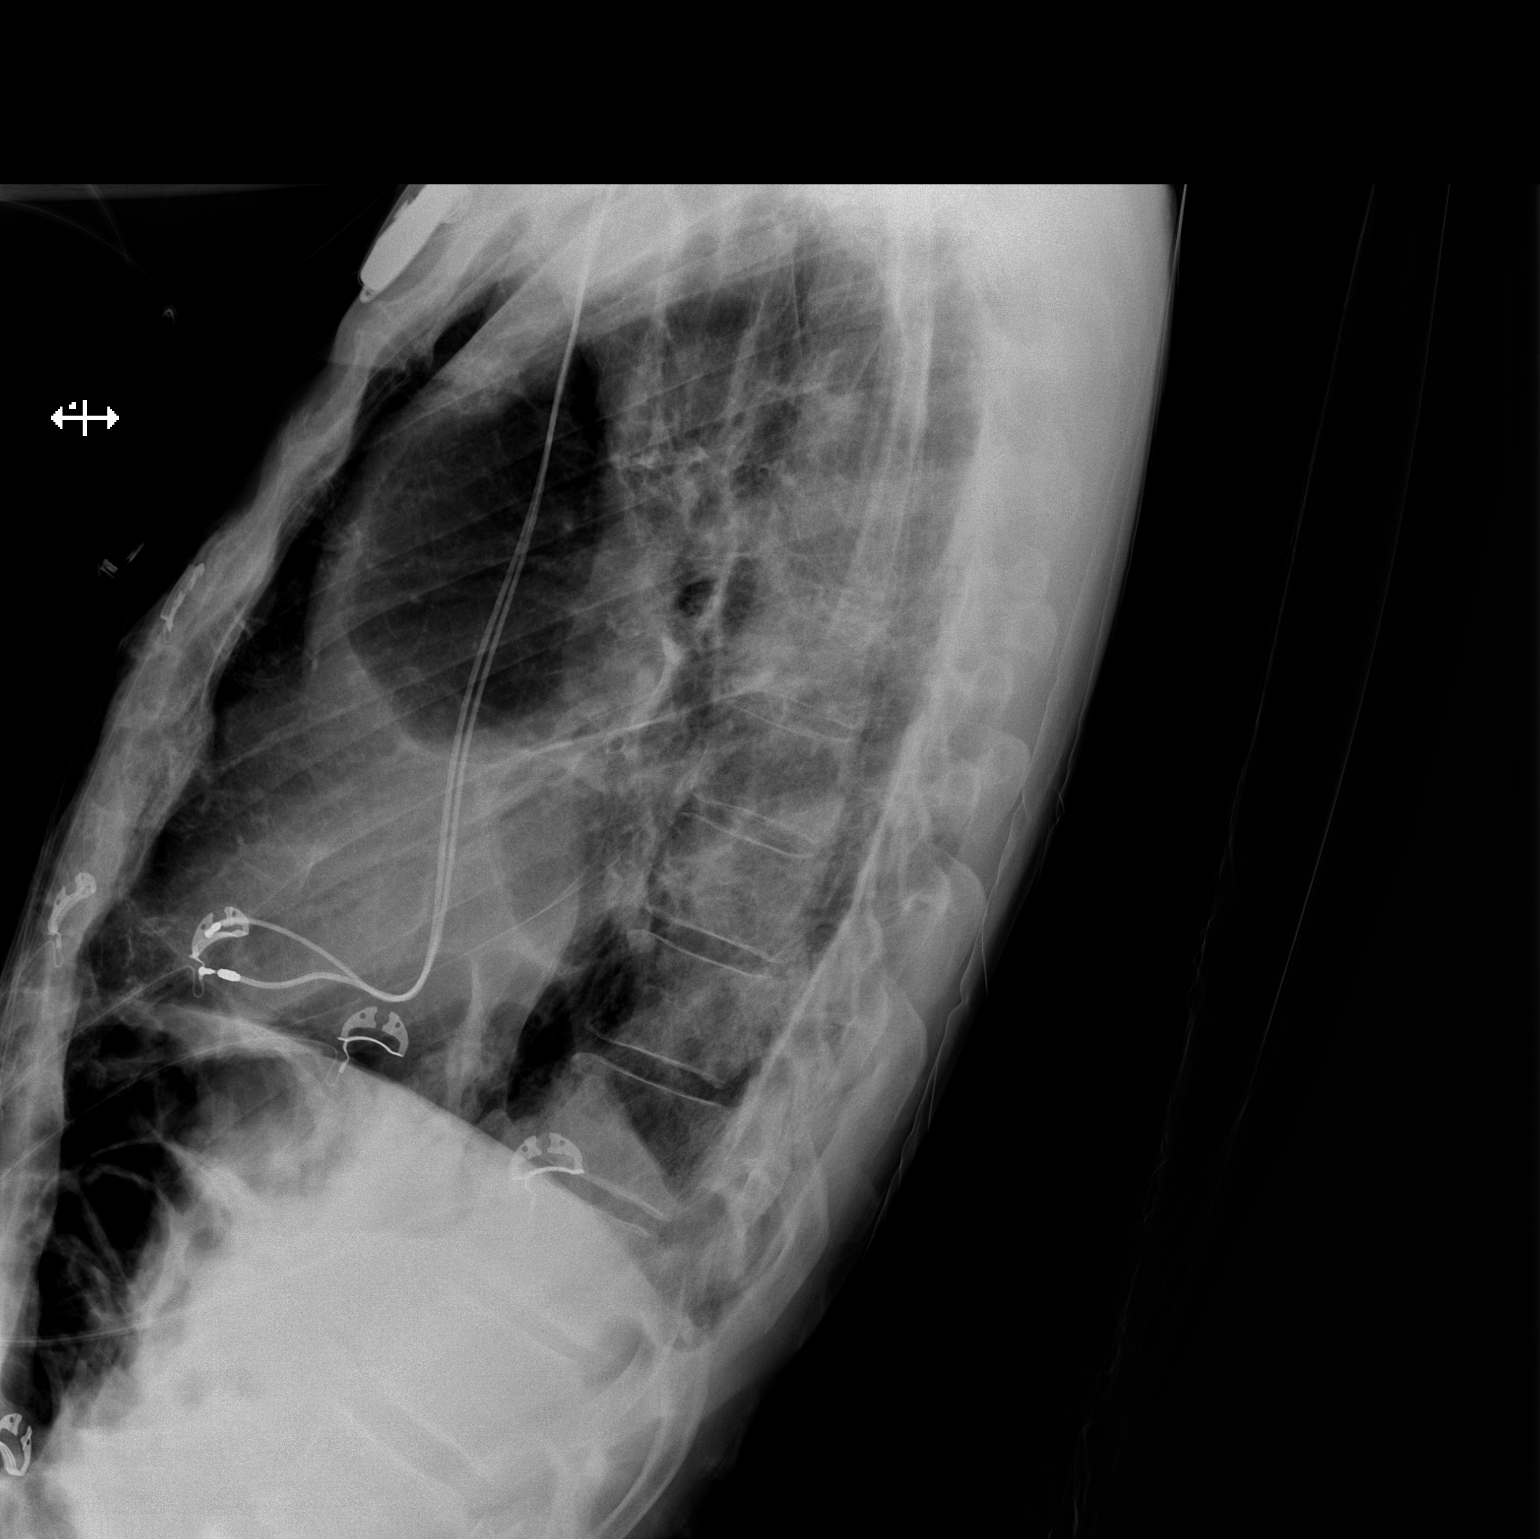

[x chest ap]
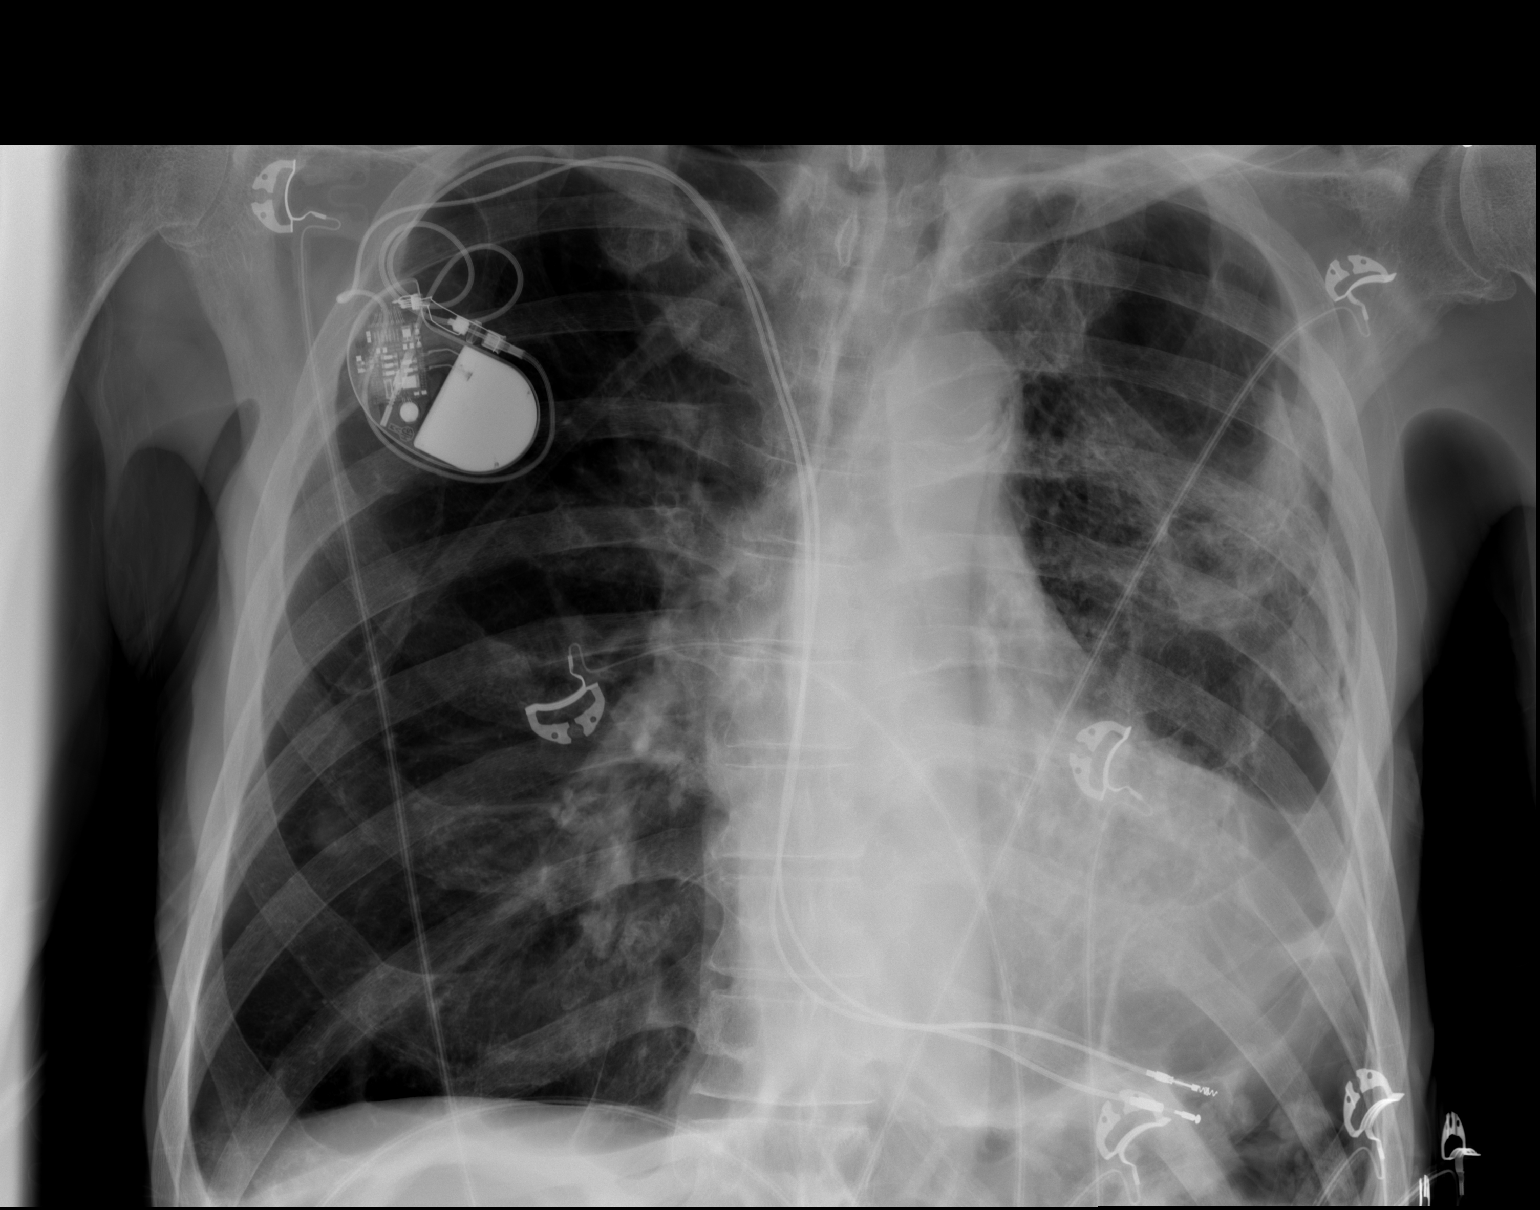

[2 of 2 positions shown; findings below may reference images not displayed]

FINDINGS: No cardiomegaly. Stable mediastinal contours that are distorted by
leftward displacement. 2 pacer leads into the right ventricle.

There is chronic opacity on the left which is primarily from
calcified pleural plaques and pleural thickening based on chest CT
in 5251. There is also a right-sided pleural plaques posteriorly
based on prior CT. Asymmetric lung density from volume loss on the
left and hyperinflation on the right. Nodular density over the right
lower chest measuring 14 mm, probable nipple shadow but not
confirmed on the lateral view. The
IMPRESSION: 1. No evidence of active disease when compared to prior.
2. Asbestos related pleural disease with asymmetric pleural
thickening and volume loss on the left, possible superimposed
fibrothorax.
3. 13 mm nodular density over the lower right lung, probable nipple
shadow but not confirmed in the lateral projection. After
convalescence a follow-up with nipple markers can be obtained.

## 2018-10-06 IMAGING — DX DG CHEST 2V
3 series · 3 of 3 positions shown · non-contrast
Comparison: Radiographs May 22, 2017.

CLINICAL DATA: Chest pain, shortness of breath.

EXAM:
CHEST  2 VIEW

[chest lat]
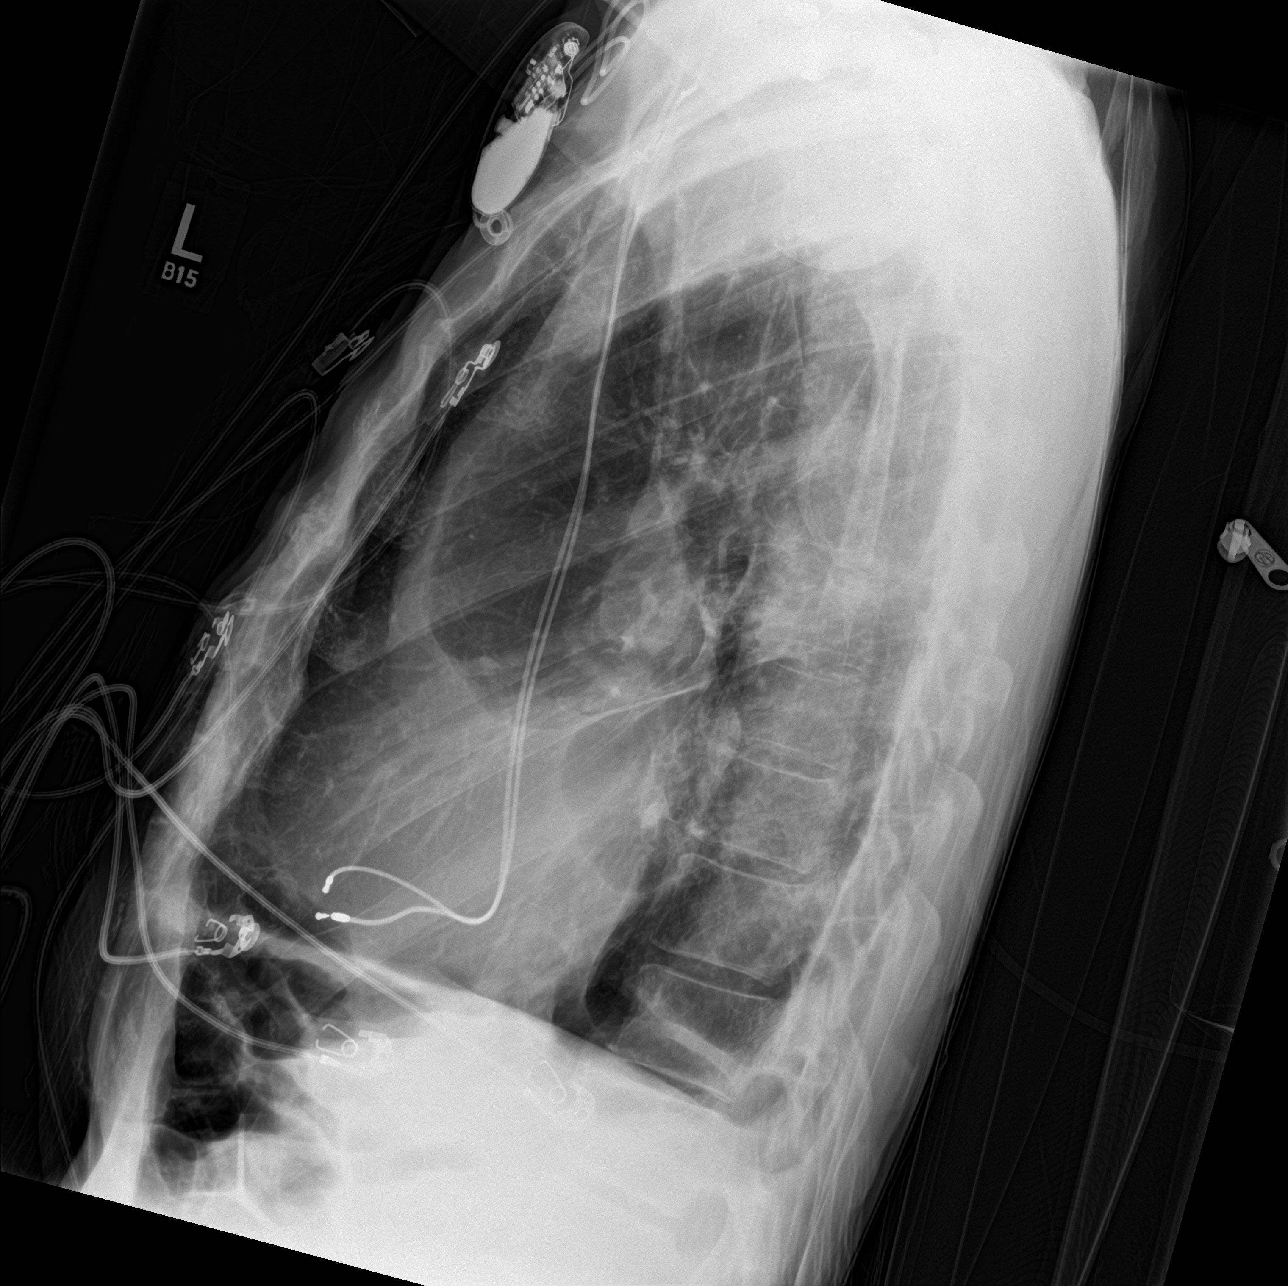

[chest ap (1 of 2)]
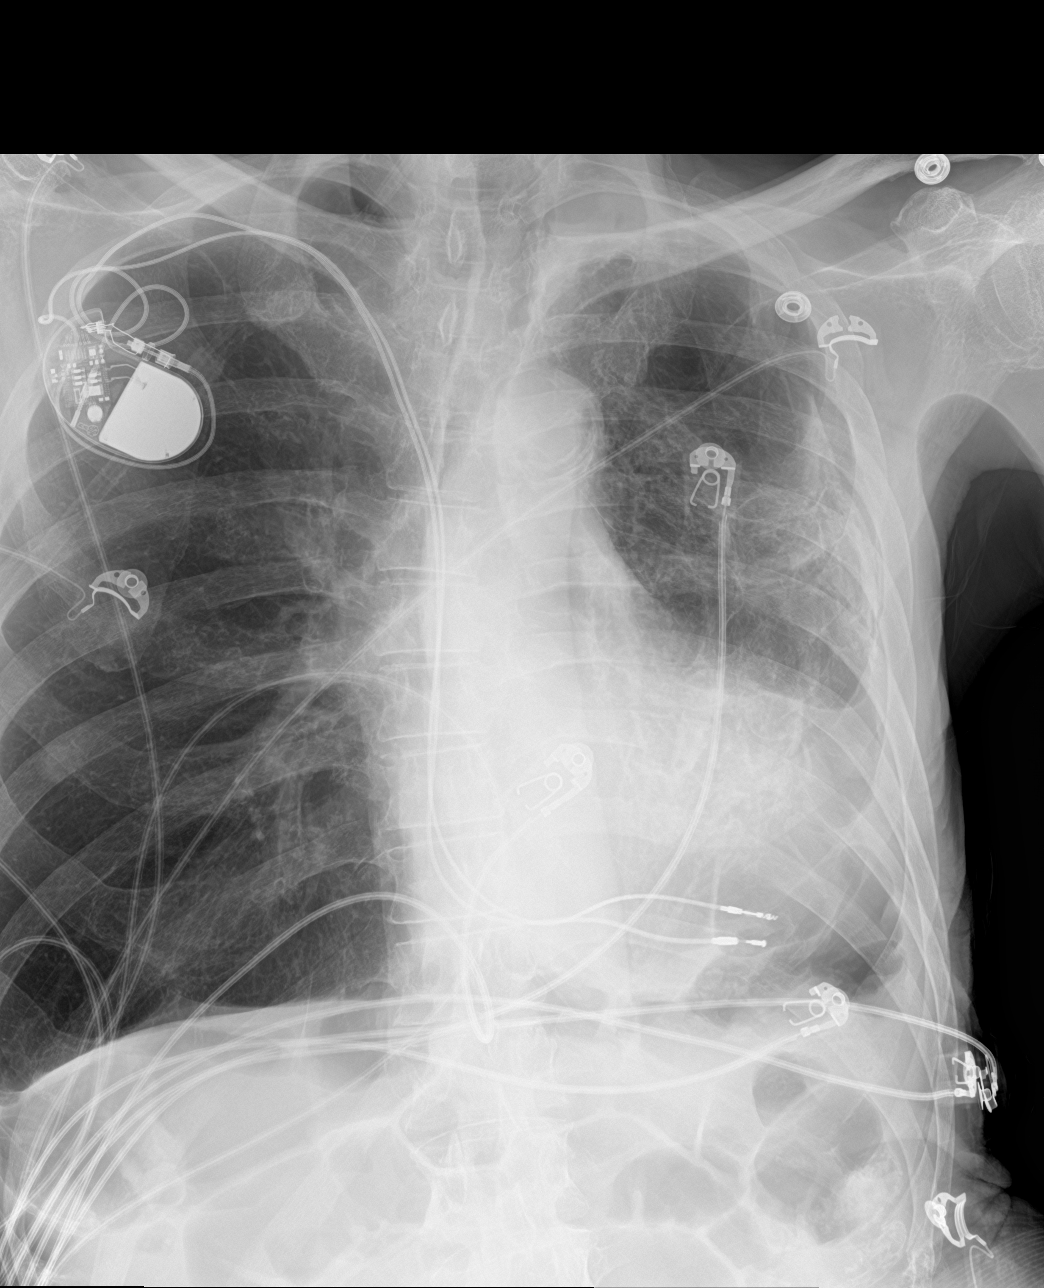

[chest ap (2 of 2)]
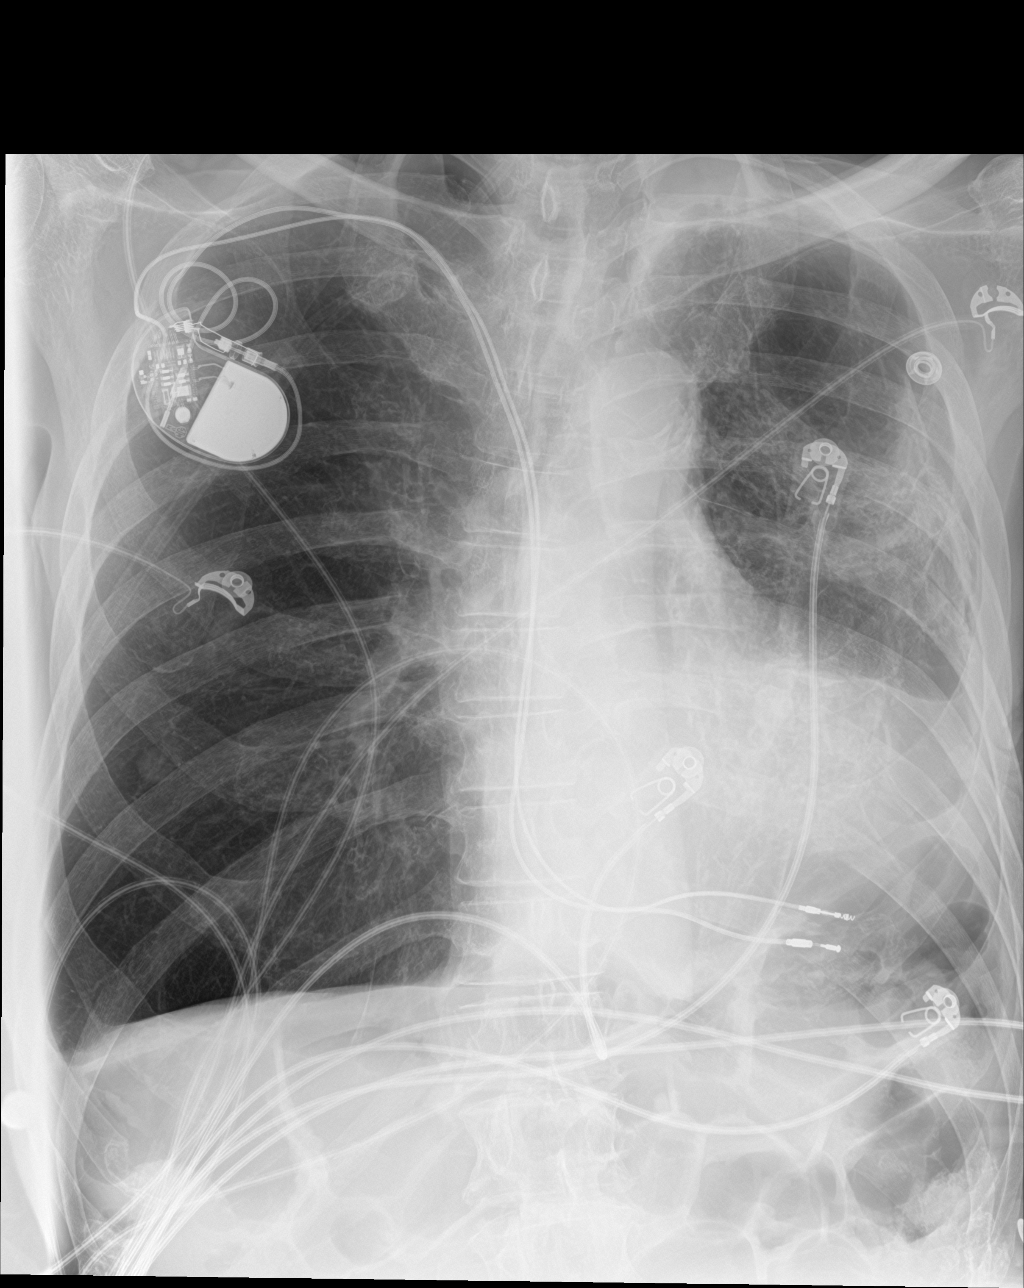

[3 of 3 positions shown; findings below may reference images not displayed]

FINDINGS: Stable cardiomediastinal silhouette. Atherosclerosis of thoracic
aorta is noted. Right-sided pacemaker is unchanged in position.
Right lung is clear. Stable calcified pleural plaques are seen in
left hemithorax. No pneumothorax is noted. Stable left basilar
scarring is noted. No acute abnormality is noted. Bony thorax is
unremarkable.
IMPRESSION: No active cardiopulmonary disease. Stable calcified pleural plaques
in left hemithorax consistent with asbestos exposure.

Aortic Atherosclerosis ([id number]-[id number]).

## 2018-10-13 IMAGING — CR DG CHEST 1V
1 series · 1 of 1 positions shown · non-contrast
Comparison: Chest x-rays dated 07/14/2017 and 10/30/2016.

CLINICAL DATA: Pain left lower chest area.  No SOB.

EXAM:
CHEST 1 VIEW

[chest ap]
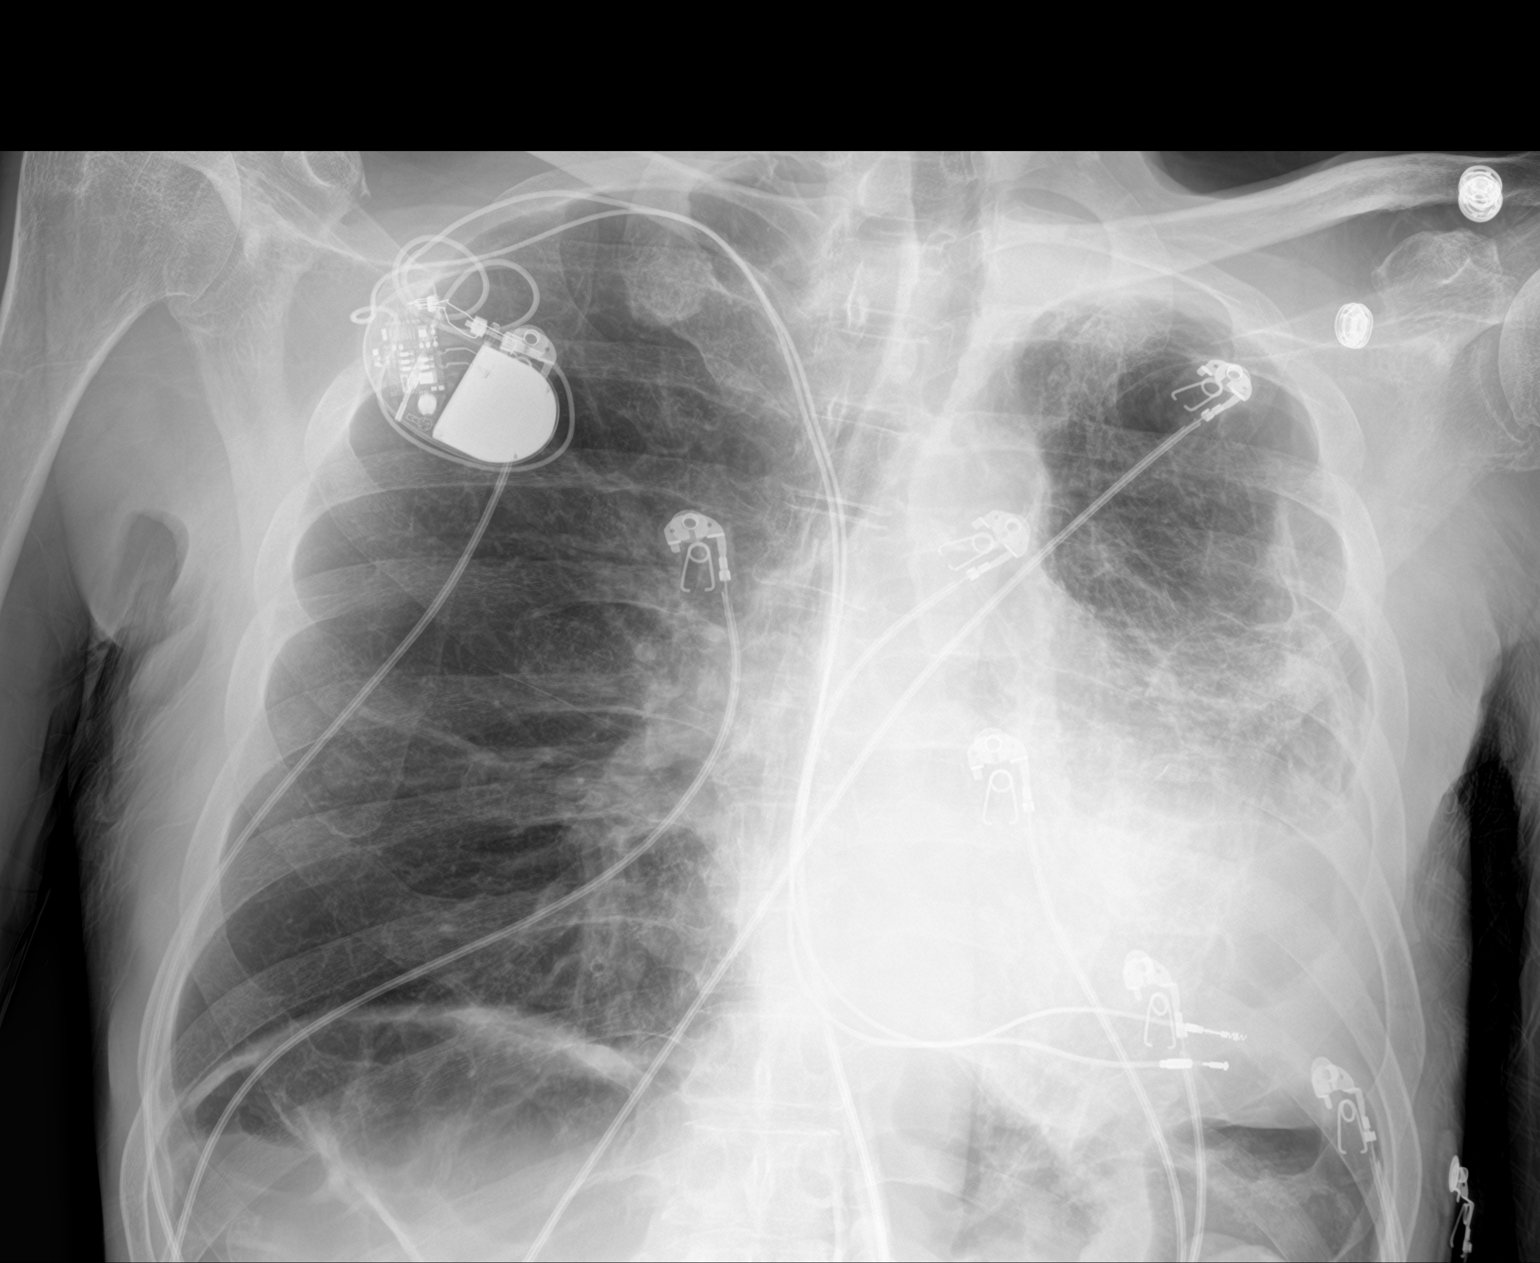

[1 of 1 positions shown; findings below may reference images not displayed]

FINDINGS: Chronic opacities throughout the left lung, most prominent at the
mid and lower lung zones, with stable architectural distortion.
Calcified pleural plaques better demonstrated on earlier chest CT,
indicating associated prior asbestos exposure.

Right lung remains relatively clear. Heart size and mediastinal
contours appear stable. Atherosclerotic changes again noted at the
aortic arch. Right chest wall pacemaker leads appear stable in
position. No acute or suspicious osseous finding.
IMPRESSION: 1. No acute findings.
2. Stable chronic opacities and architectural distortion involving
the majority of the left lung. Calcified pleural plaques better
demonstrated on earlier chest CT, indicating associated asbestos
exposure.
3. Aortic atherosclerosis.

## 2018-10-13 IMAGING — CT CT CTA ABD/PEL W/CM AND/OR W/O CM
2 of 9 series · 11 of 46 positions shown, 13 images · IV contrast (APPLIED)
Comparison: CT 10/30/2016, 02/02/2016, 11/15/2015, 10/13/2014

CLINICAL DATA: 71-year-old male with a history of abdominal pain
and questionable mesenteric ischemia

EXAM:
CTA ABDOMEN AND PELVIS wITHOUT AND WITH CONTRAST
TECHNIQUE: Multidetector CT imaging of the abdomen and pelvis was performed
using the standard protocol during bolus administration of
intravenous contrast. Multiplanar reconstructed images and MIPs were
obtained and reviewed to evaluate the vascular anatomy.
CONTRAST:  100mL [id number] IOPAMIDOL ([id number]) INJECTION 76%

[Series 7: coronals · coronal · 0.75mm/px · 2 of 130 slices shown]
[im 44/130  soft-tissue]
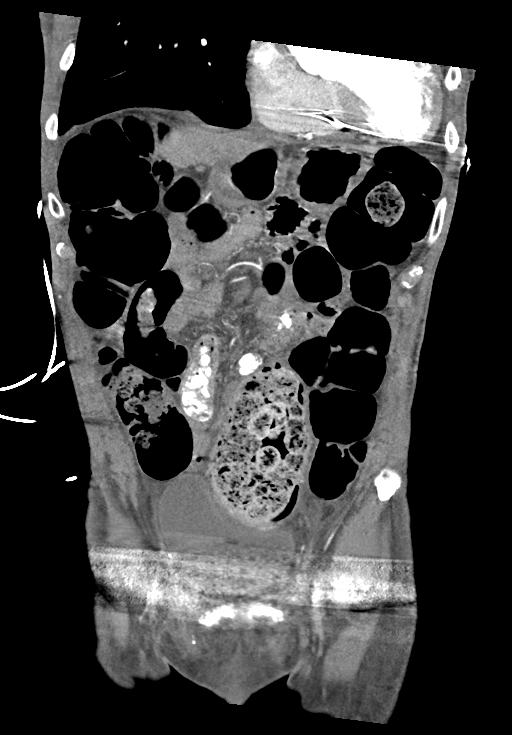
[im 87/130  soft-tissue]
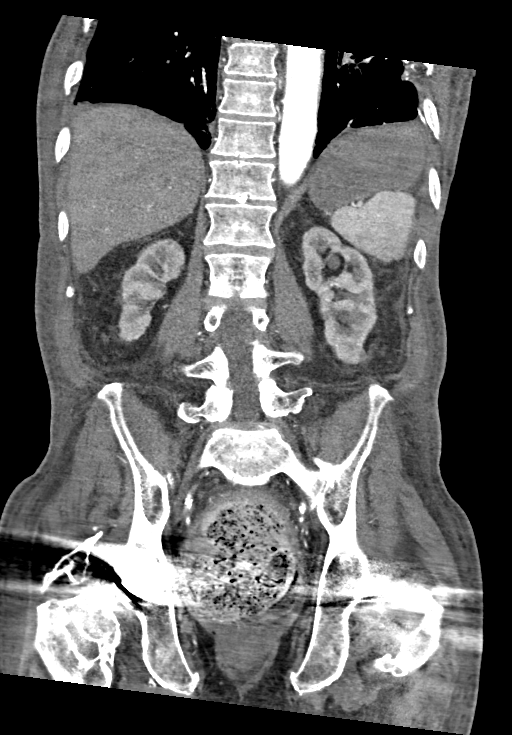

[Series 11: venous 5.0 i30f 1 · axial · portal-venous · 0.84mm/px · z∈[+970,+1365]mm · 9 of 99 slices shown, 11 images]
[im 10/99  soft-tissue]
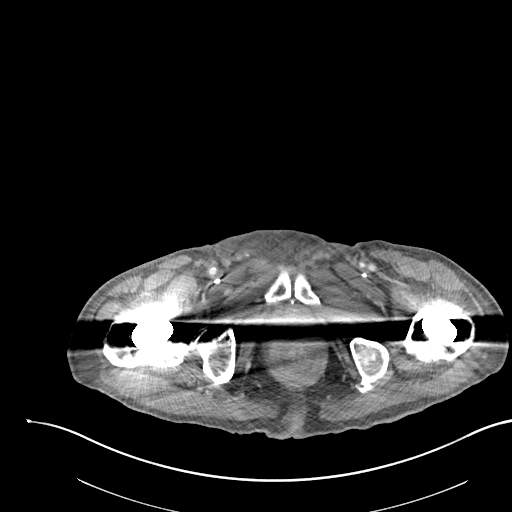
[im 10/99  bone]
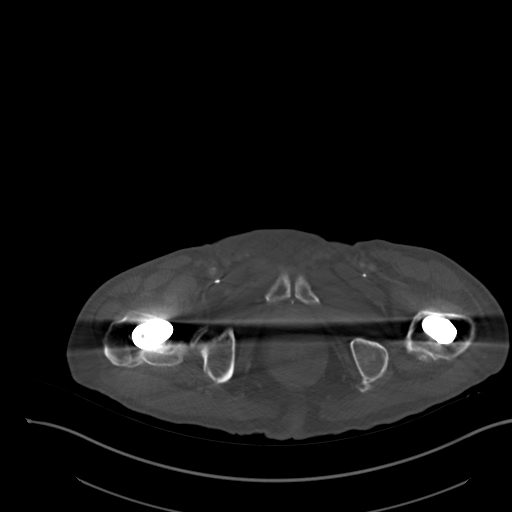
[im 20/99  soft-tissue]
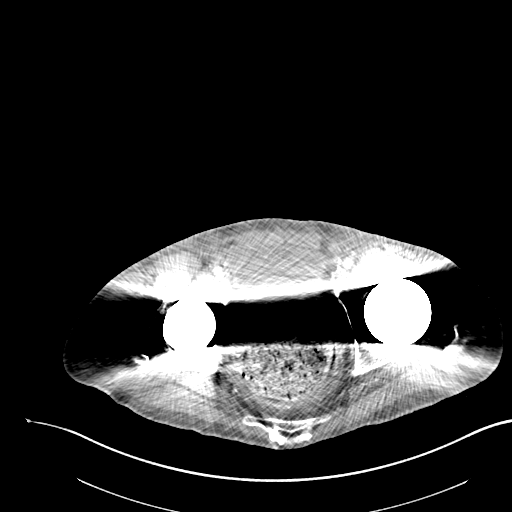
[im 30/99  soft-tissue]
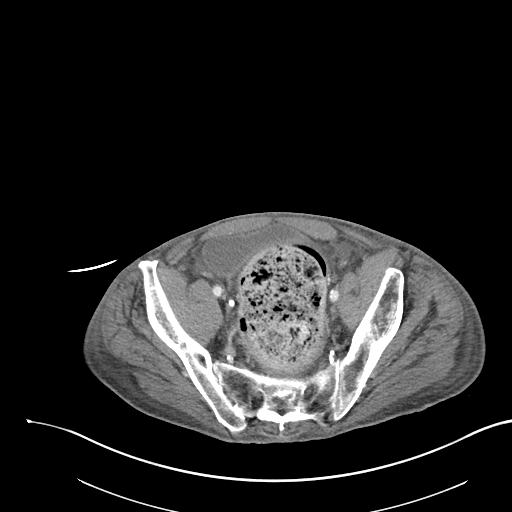
[im 40/99  soft-tissue]
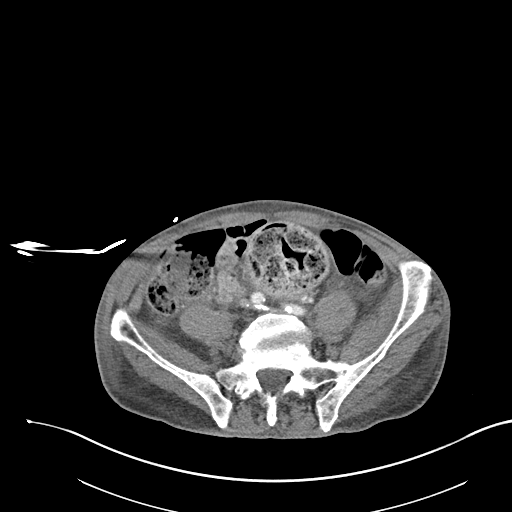
[im 50/99  soft-tissue]
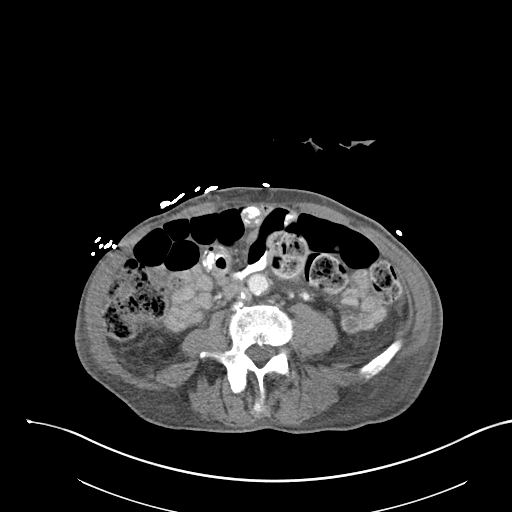
[im 59/99  soft-tissue]
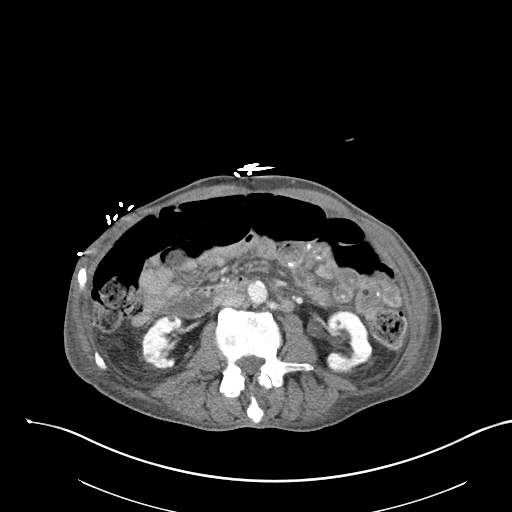
[im 69/99  soft-tissue]
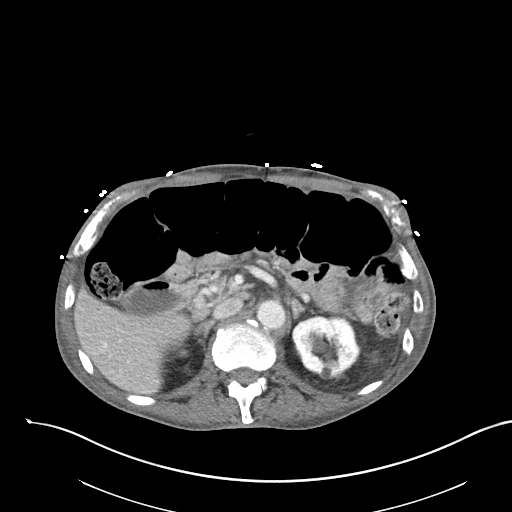
[im 79/99  soft-tissue]
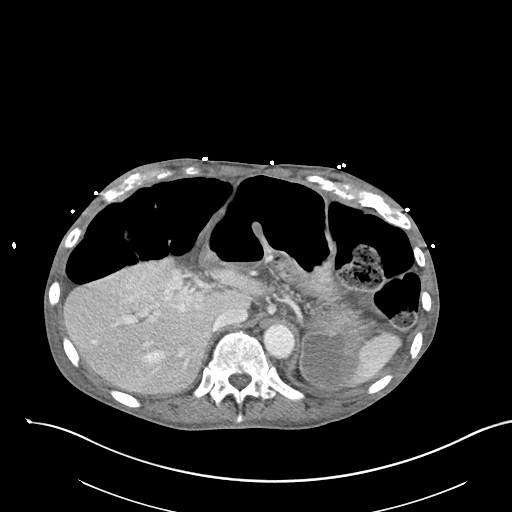
[im 89/99  soft-tissue]
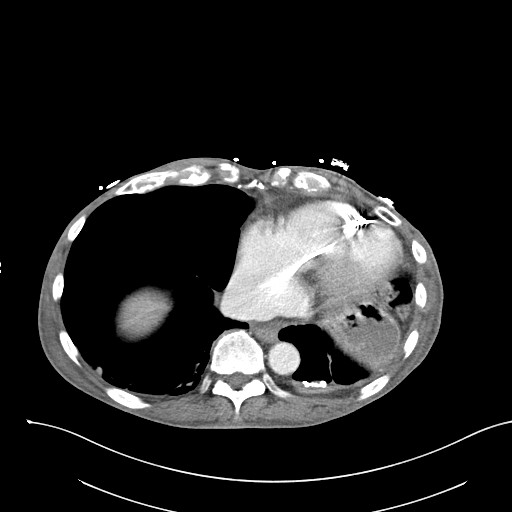
[im 89/99  bone]
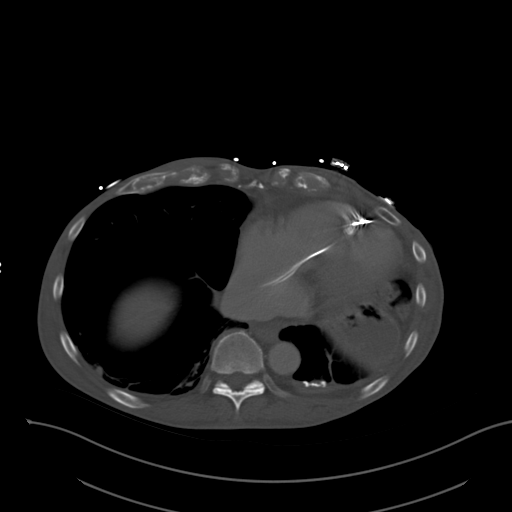

[11 of 46 positions shown; findings below may reference images not displayed]

FINDINGS: VASCULAR

Aorta: Atherosclerotic changes of the abdominal aorta, predominantly
distally with no dissection flap. No periaortic fluid or
inflammatory changes. No aneurysm.

Celiac: Celiac artery patent without significant atherosclerotic
changes at the origin. Celiac artery contributes to the splenic
artery, left gastric artery, and common hepatic artery.

There appears to be a aneurysm/pseudoaneurysm from a replaced left
hepatic artery, measuring approximately 8 mm. This is unchanged
compared to the prior CT.

SMA: Superior mesenteric artery patent without significant
atherosclerotic changes.

Renals: Main right renal artery patent without significant
atherosclerotic changes at the origin. There is a small accessory
right renal artery to the lower pole.

Main left renal artery patent without significant atherosclerotic
changes.

IMA: IMA is patent, though appears stenotic at the origin secondary
to atherosclerotic changes. Left colic artery is patent.

Right lower extremity:

Mild tortuosity of the right iliac system. Mild atherosclerotic
changes of the common and external iliac arteries. Hypogastric
artery remains patent. No aneurysm or dissection.

Common femoral artery patent with mild atherosclerotic changes.

The proximal SFA and the profunda femoris remains patent.

Left lower extremity:

Mild tortuosity of the left iliac system. Mild atherosclerotic
changes of the common and external iliac artery. Hypogastric artery
remains patent. No aneurysm or dissection.

Common femoral artery with mild atherosclerotic changes.

Proximal SFA and profunda femoris remain patent.

Veins: There appears to be developing cavernous transformation of
the portal vein at the liver hilum, with multiple collateral venous
channels. Portal vein appears patent. Splenic vein appears patent.

Review of the MIP images confirms the above findings.

NON-VASCULAR

Lower chest: Respiratory motion limits evaluation of the lung bases.
Similar appearance of pleural plaques within the left-sided pleural
space. Similar appearance of nodular changes at the left lung base
associated with the pleural plaques, compatible with rounded
atelectasis no confluent airspace disease. Leftward shift of the
mediastinal structures, similar to the comparison.

Hepatobiliary: Unremarkable appearance of liver parenchyma. There is
trace intrahepatic biliary ductal dilatation without radiopaque
stones. No evidence of extrahepatic biliary ductal dilatation.
Unremarkable appearance of the gallbladder without radiopaque
stones. No dilation of the common bile duct.

Pancreas: Unremarkable appearance of the pancreatic parenchyma

Spleen: Unremarkable spleen

Adrenals/Urinary Tract: Unremarkable appearance of the bilateral
adrenal glands

Right:

Renal parenchymal thinning without hydronephrosis. There are a few
small hypodense lesions of kidney parenchyma, none of which are
completely characterized. No evidence of hydronephrosis.
Unremarkable course of the right ureter.

Left:

Left-sided renal parenchymal thinning. No hydronephrosis. No
nephrolithiasis. There are small hypodense lesions of the cortex,
incompletely characterized by CT. Unremarkable course of the left
ureter.

Unremarkable appearance of the urinary bladder .

Stomach/Bowel: Unremarkable appearance of the stomach. Small bowel
is relatively decompressed without transition point. No abnormally
distended small bowel. There appears to be relatively maintained
perfusion of small bowel wall/mucosa, without evidence of ischemia.

There is significant formed stool within the rectal vault with mild
inflammatory changes in the adjacent fat. Large stool burden
throughout the length of the colon. Mild dilation of the transverse
colon. Appendix is not visualized, however, no inflammatory changes
are present adjacent to the cecum to indicate an appendicitis. No
evidence of perforation, with no free air.

Lymphatic: No adenopathy.

Mesenteric: No free fluid or air. No adenopathy.

Reproductive: Prostate not well visualized secondary to streak
artifact from metallic hardware.

Other: No hernia.

Musculoskeletal: Bilateral hip arthroplasty contributes to streak
artifact of the pelvis, which limits evaluation of the pelvic
structures. Degenerative changes of the lumbar with no bony canal
narrowing. Advanced facet disease of the lower lumbar levels.
Bilateral L5 pars defect with grade 1 anterolisthesis.

Trace retrolisthesis of L3 on L4.  No displaced fracture.
IMPRESSION: No acute vascular abnormality.

Mild atherosclerotic changes of the aorta, with no significant
mesenteric arterial disease that was suggest acute or chronic
mesenteric ischemia. Aortic Atherosclerosis ([id number]).

Significant formed stool burden of the rectum, suggesting at least
constipation, potentially obstipation. There are mild inflammatory
changes in the fat adjacent to the rectum, which can be seen with
stercoral colitis. Correlation with symptoms/patient presentation
may be useful.

Redemonstration of left-sided pleural plaques with associated
rounded atelectasis.

Portal vein remains patent, with what appears to be developing
cavernous transformation at the liver hilum.

## 2018-10-15 IMAGING — DX DG ABDOMEN 1V
1 series · 1 of 1 positions shown · non-contrast
Comparison: CT scans of the abdomen and pelvis dated 07/21/2017,
10/30/2016 and 02/02/2016

CLINICAL DATA: Abdominal pain and distention.

EXAM:
ABDOMEN - 1 VIEW

[abdomen kub]
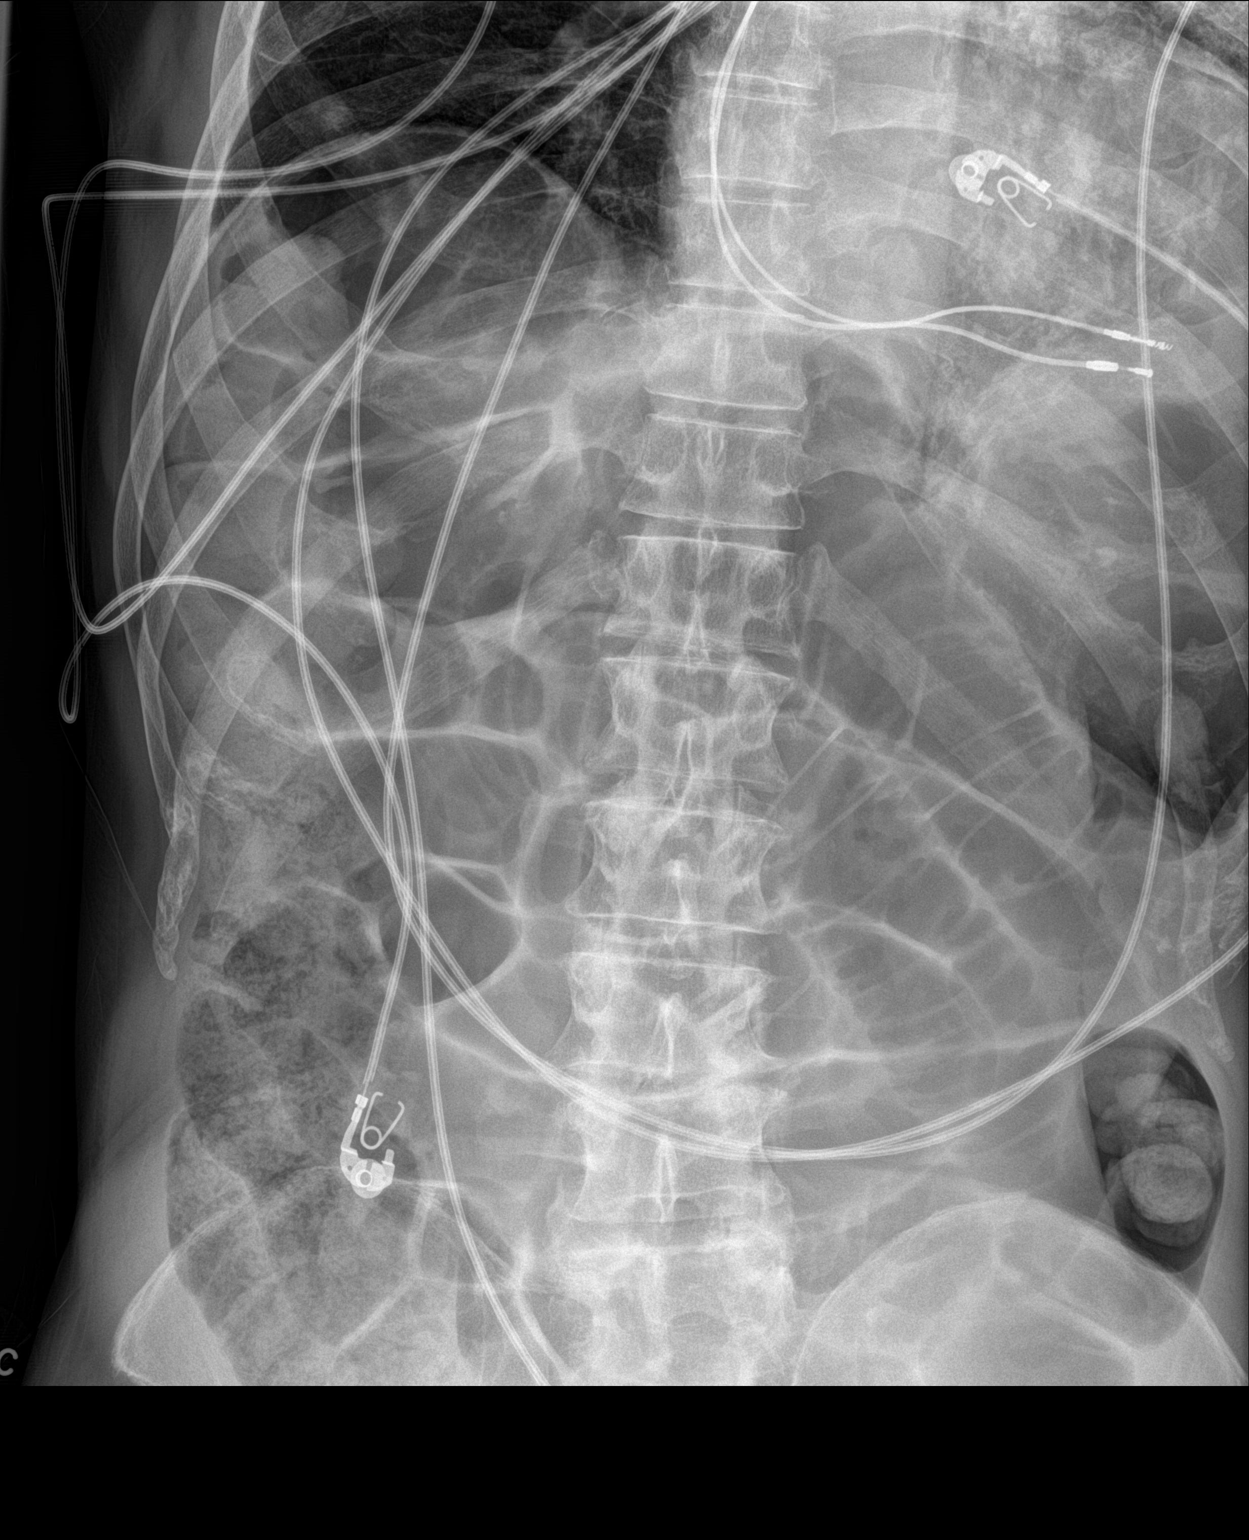

[1 of 1 positions shown; findings below may reference images not displayed]

FINDINGS: There is extensive air in the nondistended colon and there are
multiple distended loops of small bowel. No visible free air. Two
pacemaker leads in the heart.

No acute bone abnormality.
IMPRESSION: Multiple dilated loops of small bowel. Extensive air in the
nondistended colon. This could represent an ileus but the
possibility small bowel obstruction should be considered because of
lack of distention of the colon as compared to the small bowel.

## 2018-10-16 IMAGING — CT CT ABD-PELV W/O CM
2 of 4 series · 16 of 46 positions shown, 18 images · non-contrast
Comparison: CTA abdomen/pelvis dated 07/21/2017

CLINICAL DATA: Lower abdominal pain, diarrhea, abdominal distention

EXAM:
CT ABDOMEN AND PELVIS WITHOUT CONTRAST
TECHNIQUE: Multidetector CT imaging of the abdomen and pelvis was performed
following the standard protocol without IV contrast.

[Series 4: a/p w/o 5mm · axial · non-contrast · 0.72mm/px · z∈[-15,+445]mm · 13 of 102 slices shown, 15 images]
[im 5/102  soft-tissue]
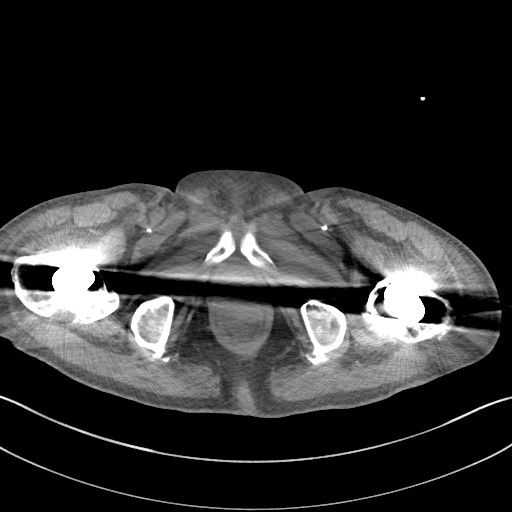
[im 5/102  bone]
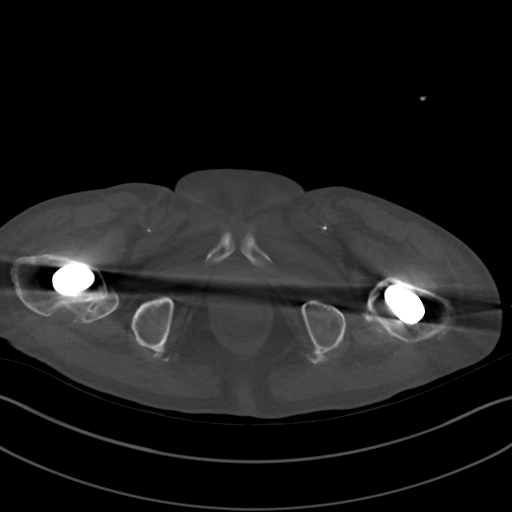
[im 13/102  soft-tissue]
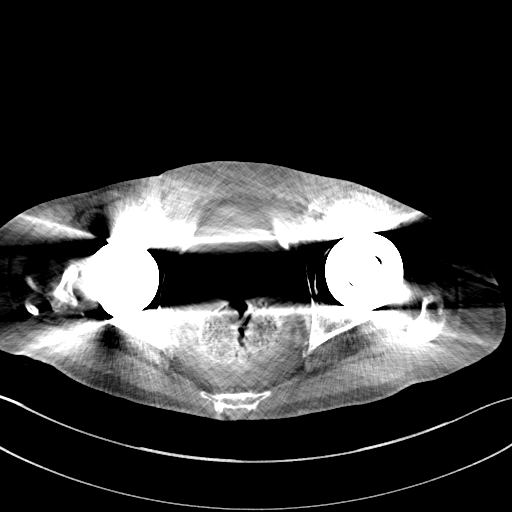
[im 21/102  soft-tissue]
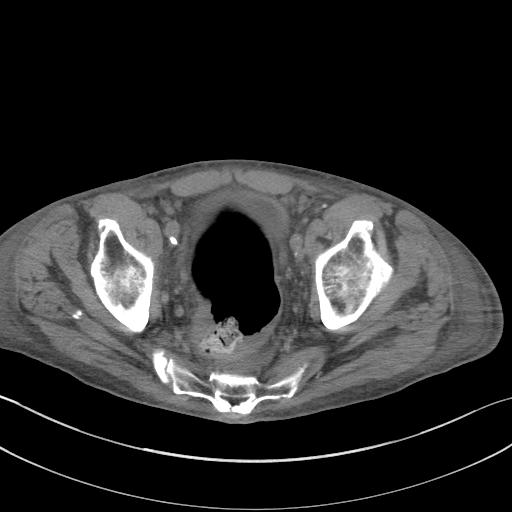
[im 29/102  soft-tissue]
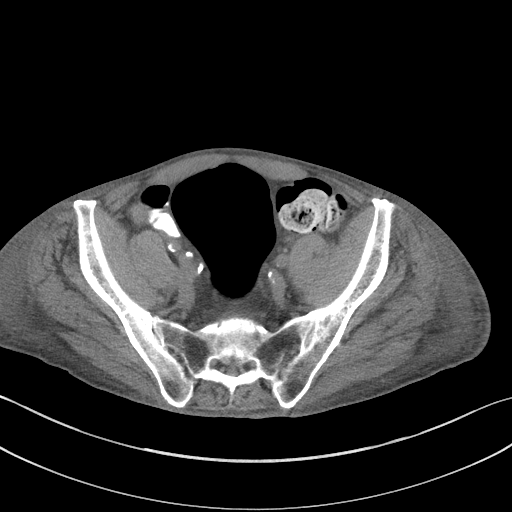
[im 37/102  soft-tissue]
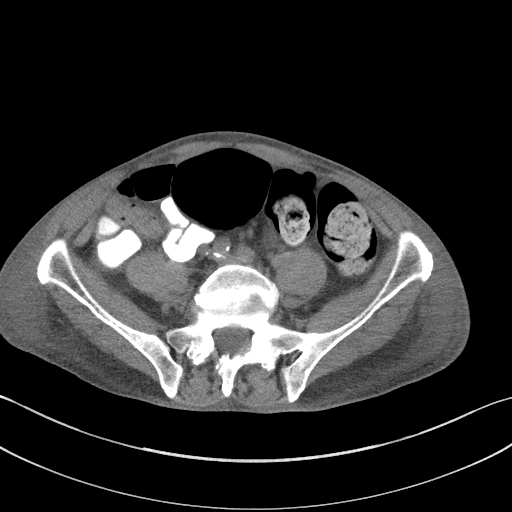
[im 45/102  soft-tissue]
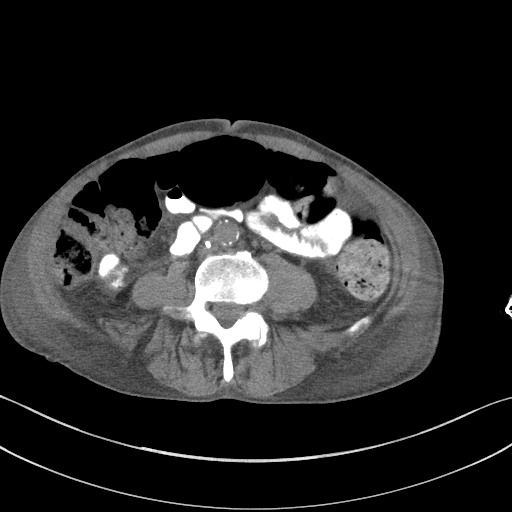
[im 53/102  soft-tissue]
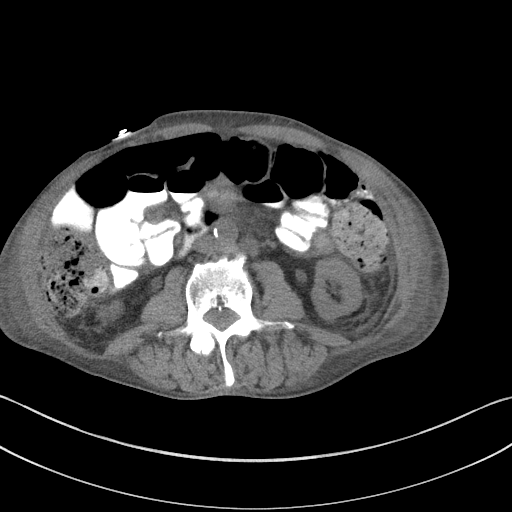
[im 57/102  soft-tissue]
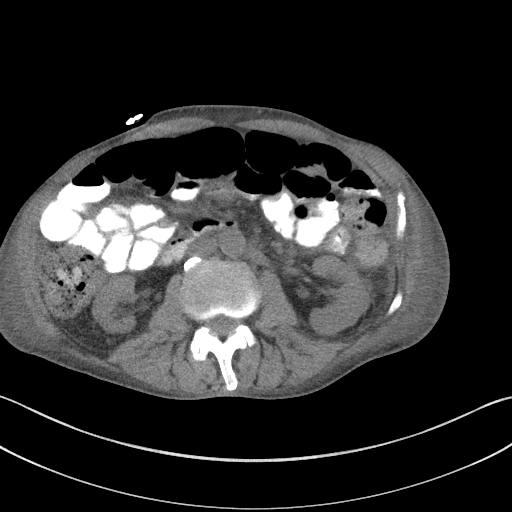
[im 65/102  soft-tissue]
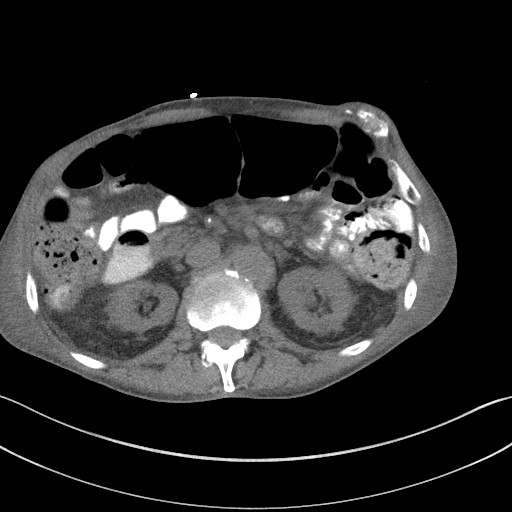
[im 65/102  bone]
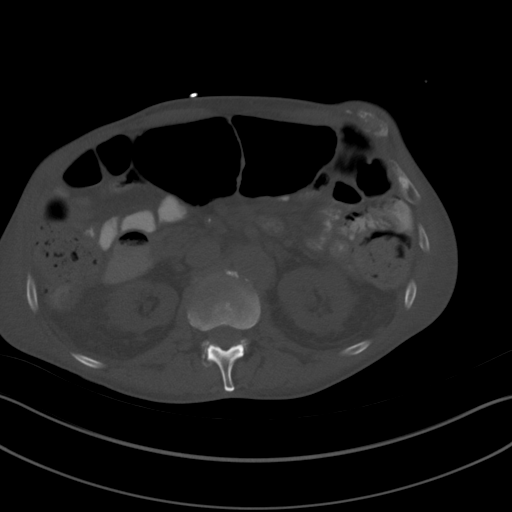
[im 73/102  soft-tissue]
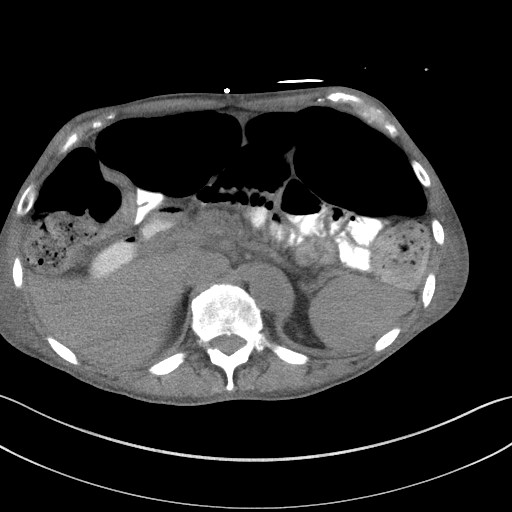
[im 81/102  soft-tissue]
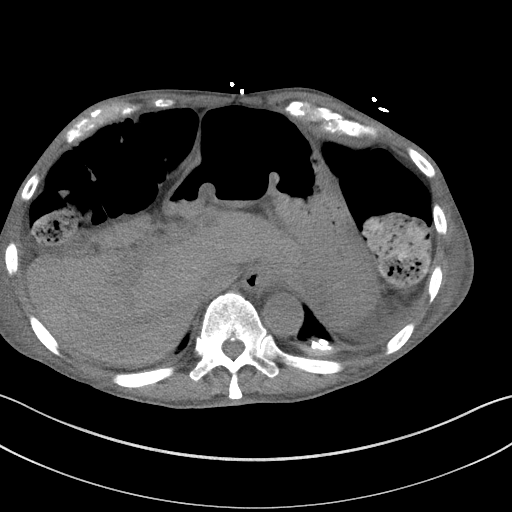
[im 89/102  soft-tissue]
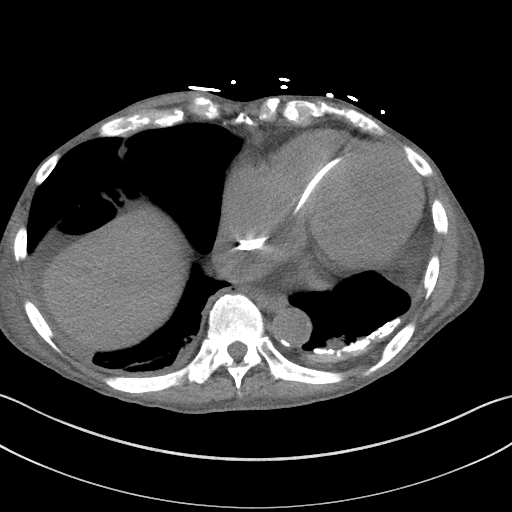
[im 97/102  soft-tissue]
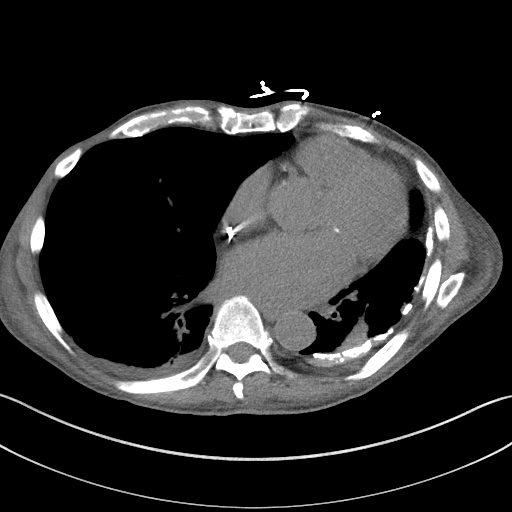

[Series 7: a/p w/o cor · coronal · non-contrast · 0.89mm/px · 3 of 138 slices shown]
[im 46/138  soft-tissue]
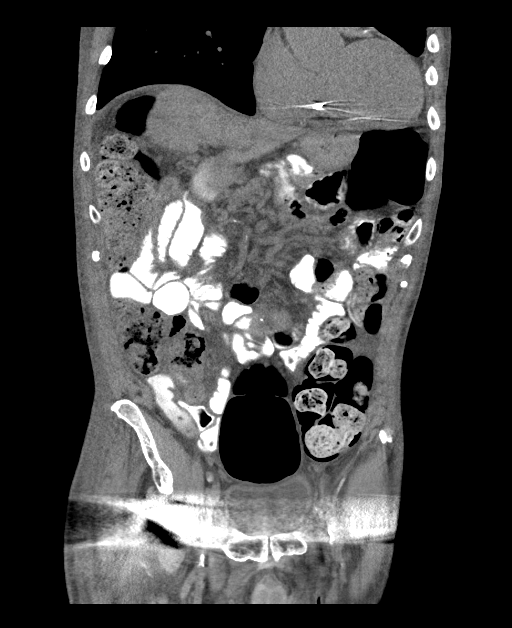
[im 61/138  soft-tissue]
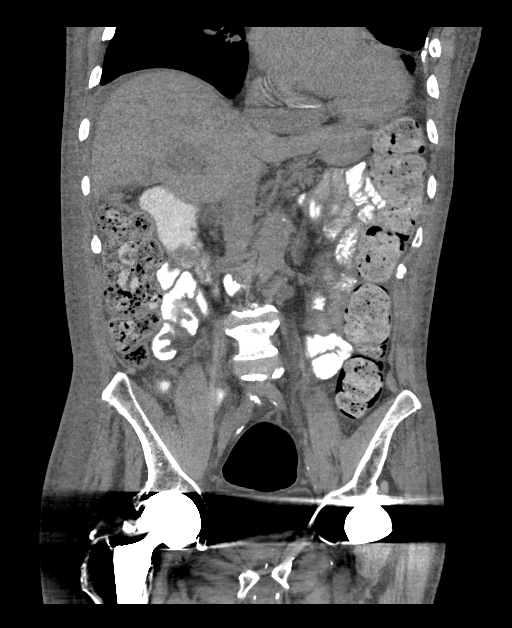
[im 77/138  soft-tissue]
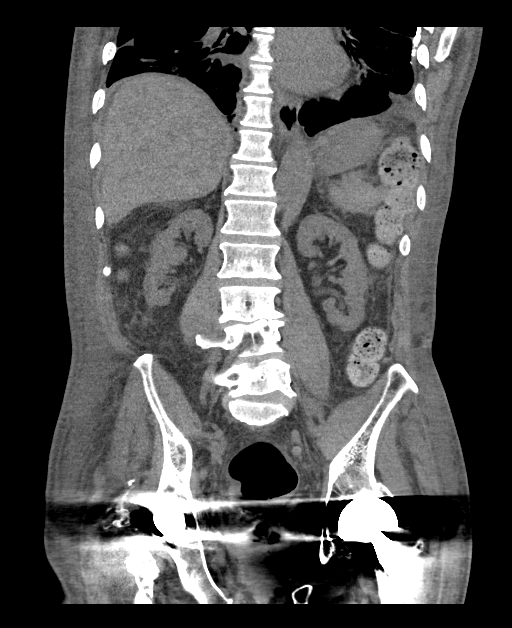

[16 of 46 positions shown; findings below may reference images not displayed]

FINDINGS: Lower chest: Trace bilateral pleural effusions with chronic left
pleural plaques. Rounded left lower lobe atelectasis. Mild right
lower lobe atelectasis.

Leftward cardiomediastinal shift. Pacemaker leads, incompletely
visualized.

Hepatobiliary: Unenhanced liver is grossly unremarkable.

Gallbladder is unremarkable. No intrahepatic or extrahepatic ductal
dilatation.

Pancreas: Within normal limits.

Spleen: Within normal limits.

Adrenals/Urinary Tract: Adrenal glands within normal limits.

Right renal cortical scarring/atrophy. Left kidney is grossly
unremarkable. No renal calculi or hydronephrosis.

Bladder is grossly unremarkable although partially obscured by
streak artifact.

Stomach/Bowel: Stomach is within normal limits.

No evidence of bowel obstruction.

Normal appendix (series 4/image 63).

Mildly dilated, gas-filled loops of nondependent colon, raising the
possibility of adynamic colonic ileus. Mild rectal stool burden,
significantly improved from the prior.

Vascular/Lymphatic: No evidence of abdominal aortic aneurysm.

No suspicious abdominopelvic lymphadenopathy.

Reproductive: Prostate is notable for dystrophic calcifications,
partially obscured by streak artifact.

Other: No abdominopelvic ascites.

Musculoskeletal: Degenerative changes of the visualized
thoracolumbar spine.

Bilateral pars defects at L5-S1.

Bilateral hip arthroplasties, without evidence of complication.
IMPRESSION: Mildly dilated loops of nondependent colon, raising the possibility
of adynamic colonic ileus. No evidence of bowel obstruction.

Mild rectal stool burden, significantly improved from the prior.

Additional stable ancillary findings as above.

## 2018-11-14 IMAGING — DX DG CHEST 2V
2 series · 2 of 2 positions shown · non-contrast
Comparison: 07/21/2017

CLINICAL DATA: Cough for 1 week.  Intermittent fever.

EXAM:
CHEST - 2 VIEW

[chest lat]
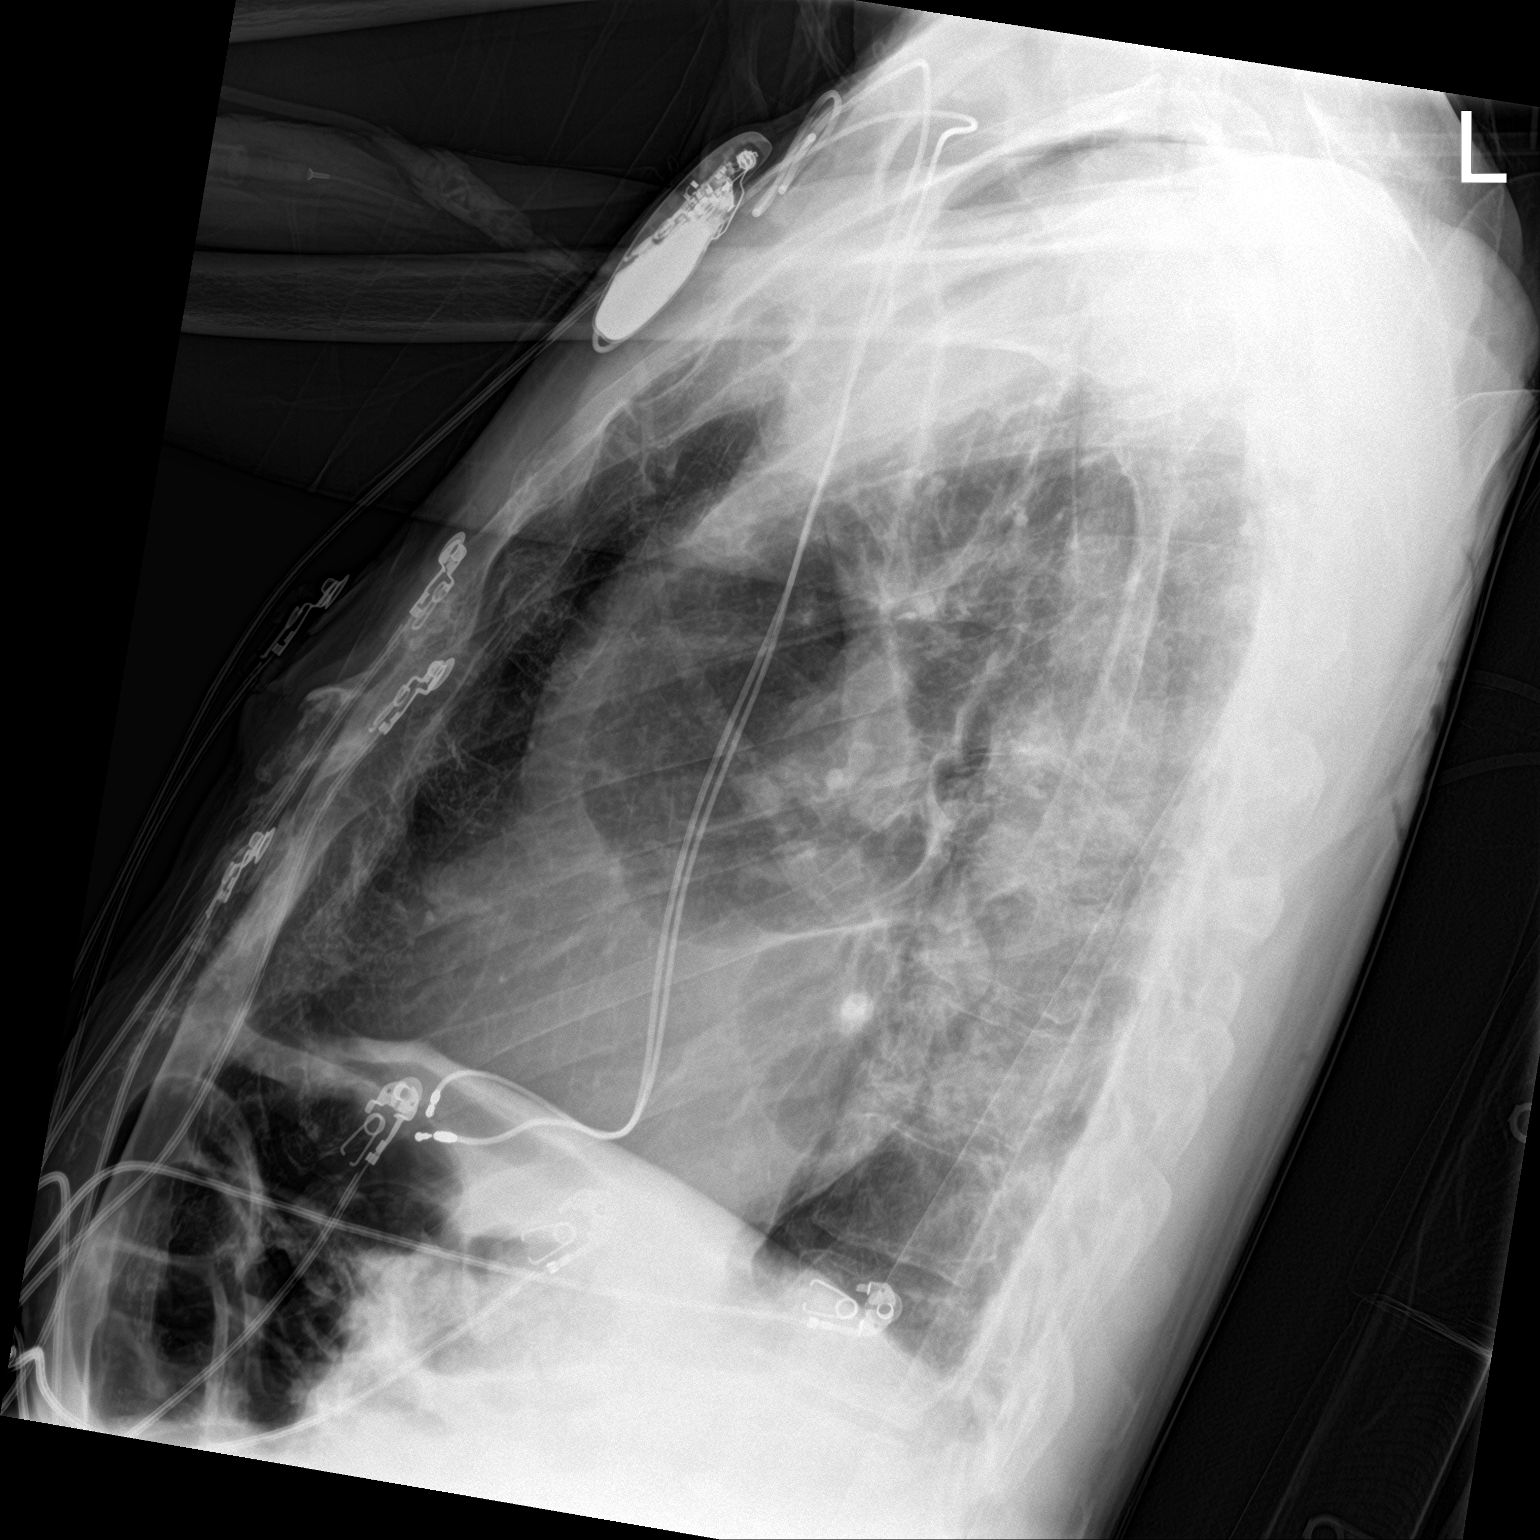

[chest ap]
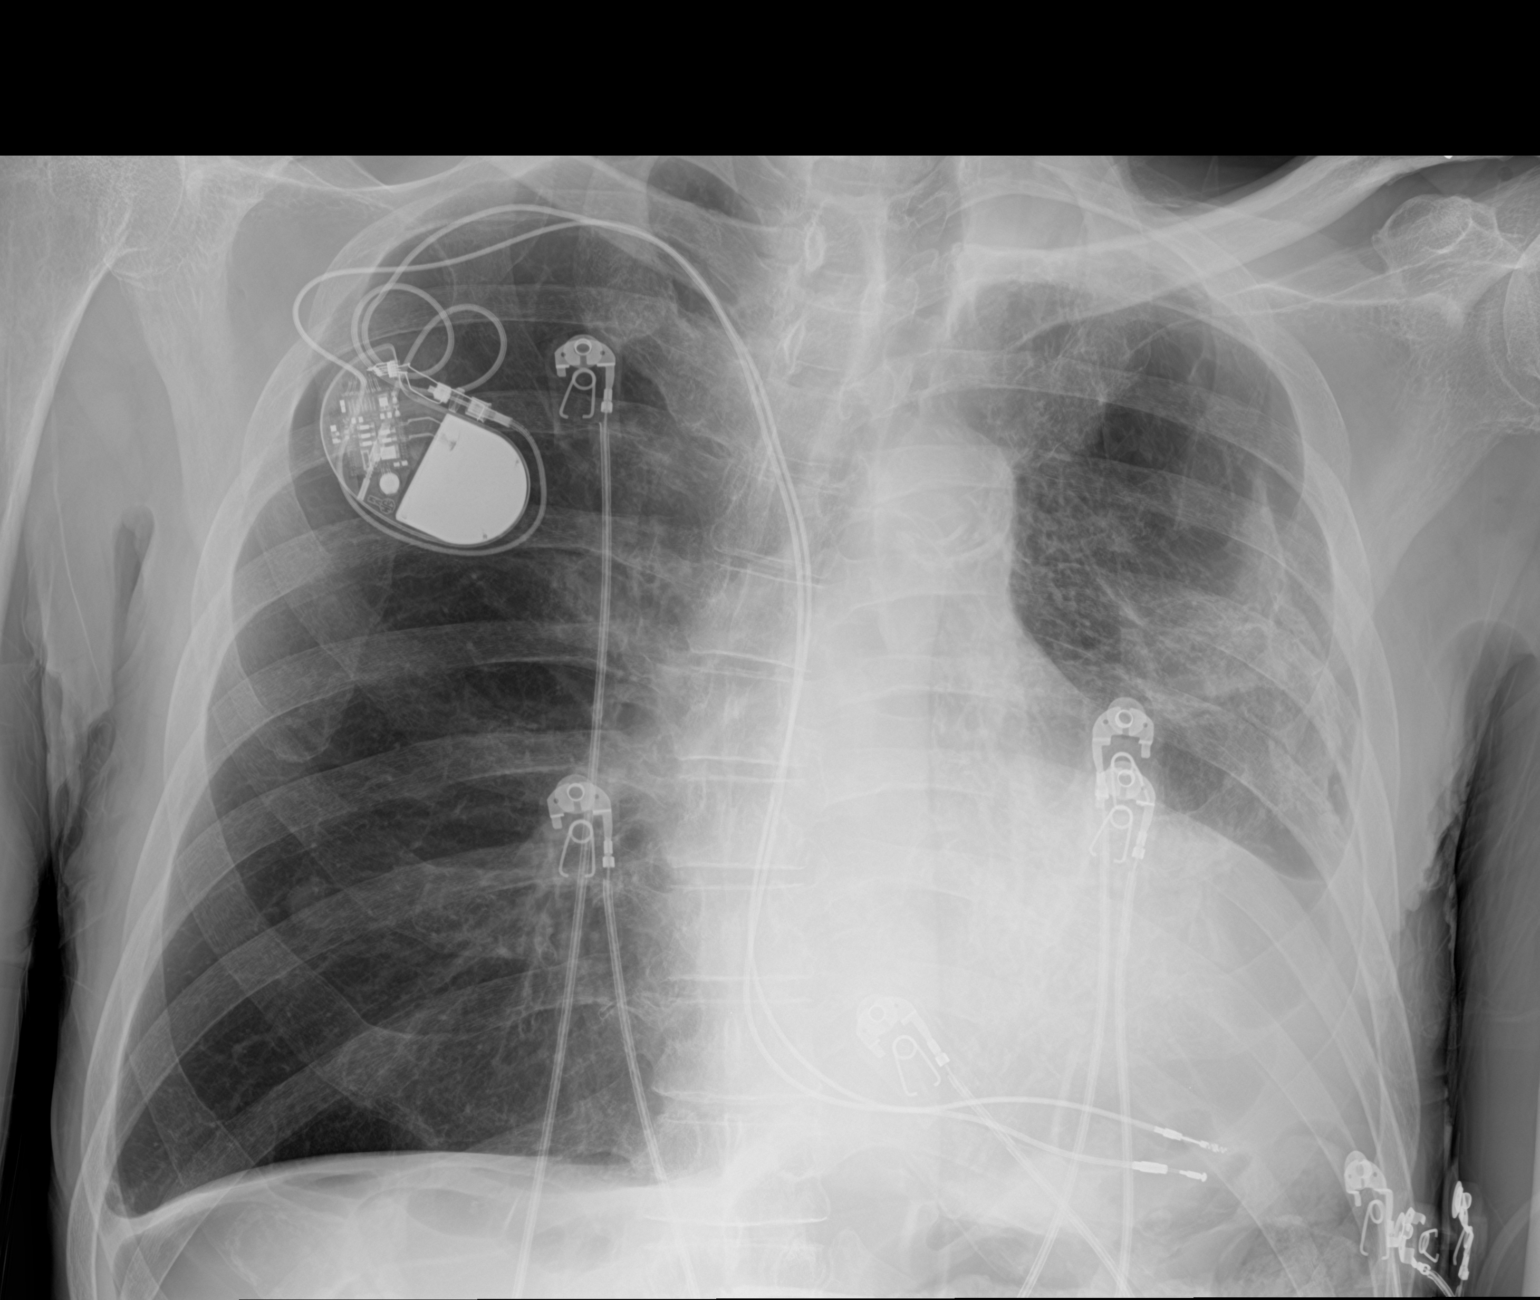

[2 of 2 positions shown; findings below may reference images not displayed]

FINDINGS: The cardiomediastinal silhouette is unchanged with left-sided
mediastinal shift again seen. A pacemaker remains in place with
leads 2 leads terminating in the right ventricle. Aortic
atherosclerosis is noted. There is chronic left lung volume loss
with pleural thickening extending over the apex and with associated
calcified pleural plaques. Chronic architectural distortion and
parenchymal opacities in the left mid to lower lung are similar to
the prior study. Chronic 13 mm nodular density projecting over the
right lung is unchanged and may represent a nipple shadow. The right
lung is hyperinflated and otherwise clear. No pneumothorax or acute
osseous abnormality is identified.
IMPRESSION: Chronic changes including pleural plaques and left lung volume loss
without evidence of acute abnormality.

## 2018-11-14 IMAGING — CT CT CERVICAL SPINE W/O CM
4 of 8 series · 12 of 33 positions shown, 13 images · non-contrast
Comparison: 09/19/2015 and previous

CLINICAL DATA: Pt woke up this am and fell striking head having
weakness and dizziness and generally feels unwell per pt

EXAM:
CT HEAD WITHOUT CONTRAST
CT CERVICAL SPINE WITHOUT CONTRAST
TECHNIQUE: Multidetector CT imaging of the head and cervical spine was
performed following the standard protocol without intravenous
contrast. Multiplanar CT image reconstructions of the cervical spine
were also generated.

[Series 8: c_spine 2.0 st · axial · 0.38mm/px · z∈[-278,-160]mm · 3 of 119 slices shown, 4 images]
[im 30/119  soft-tissue]
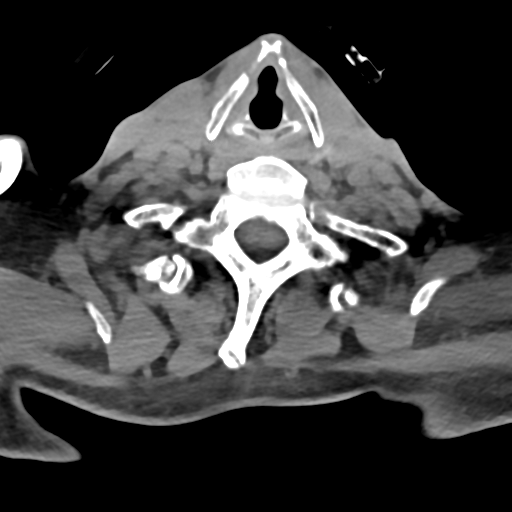
[im 30/119  bone]
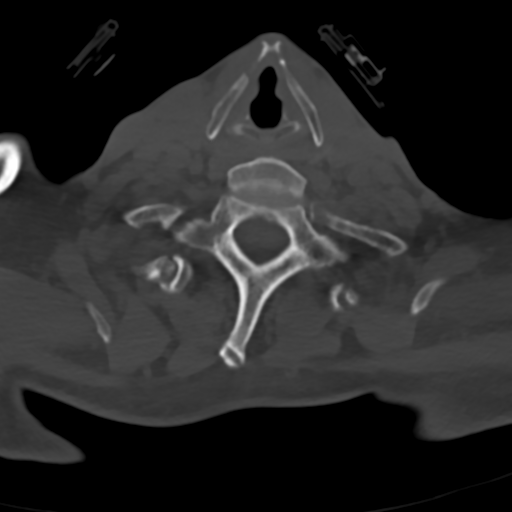
[im 60/119  bone]
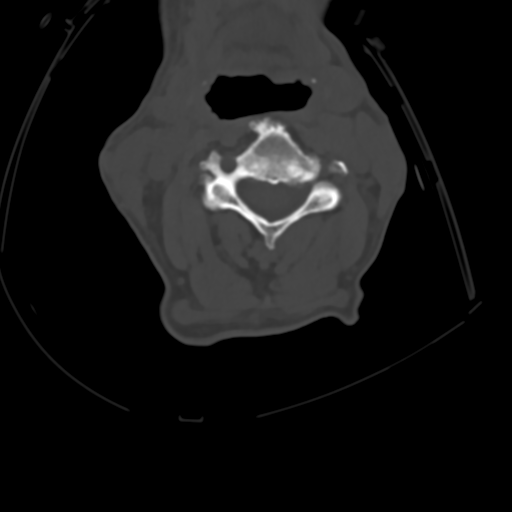
[im 89/119  bone]
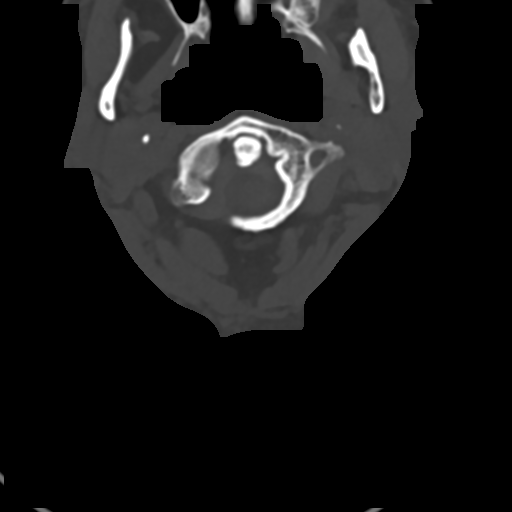

[Series 10: c_spine 2.0 sag bone · sagittal · 0.35mm/px · 5 of 65 slices shown]
[im 11/65  bone]
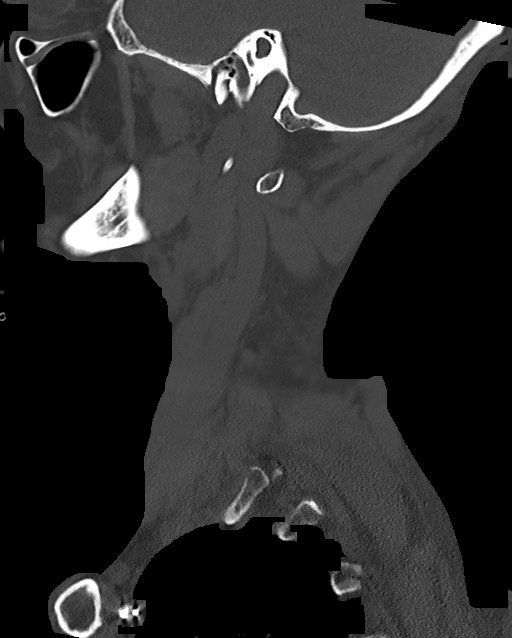
[im 22/65  bone]
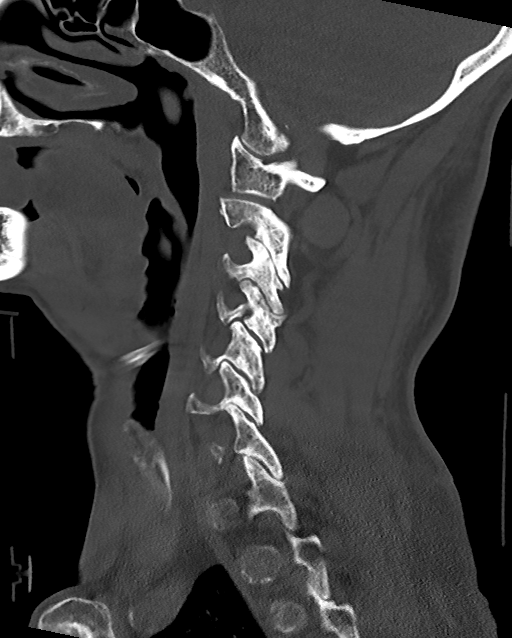
[im 33/65  bone]
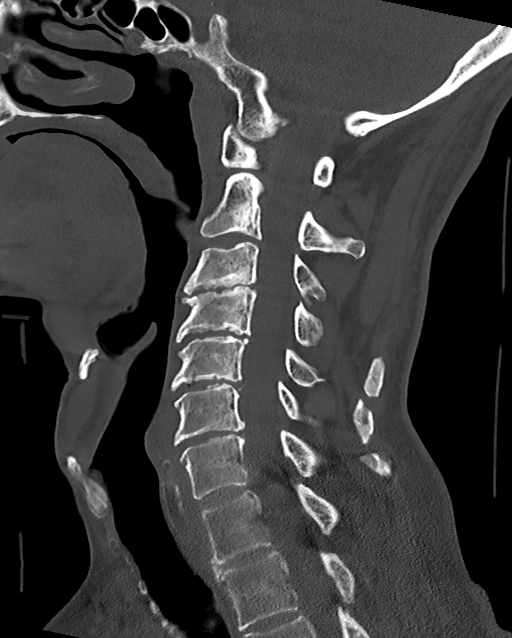
[im 43/65  bone]
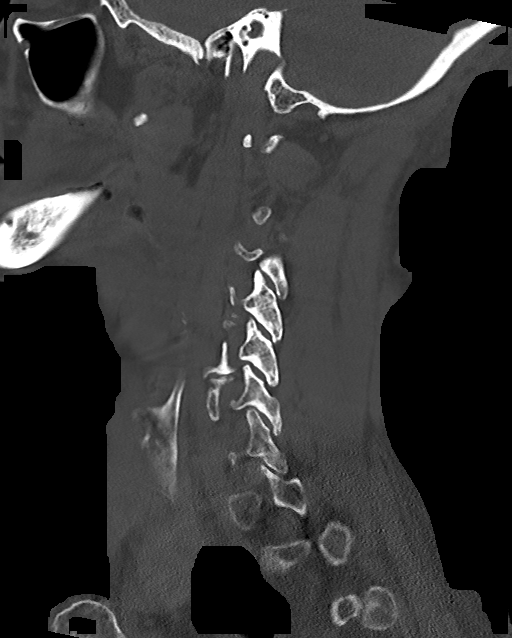
[im 54/65  bone]
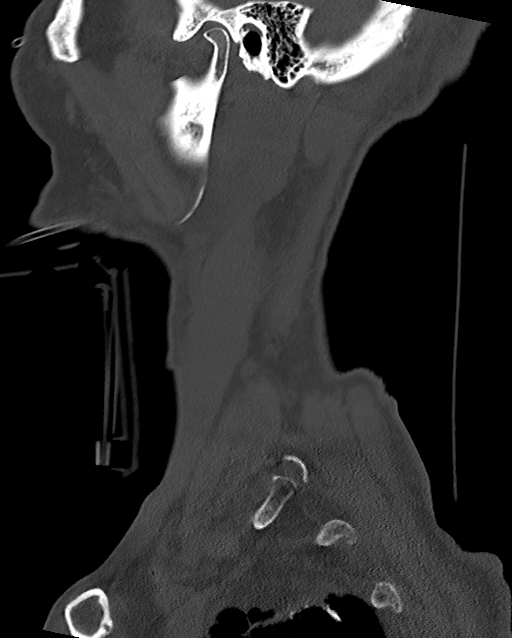

[Series 11: c_spine 2.0 cor bone · coronal · 0.29mm/px · 1 of 76 slices shown]
[im 38/76  bone]
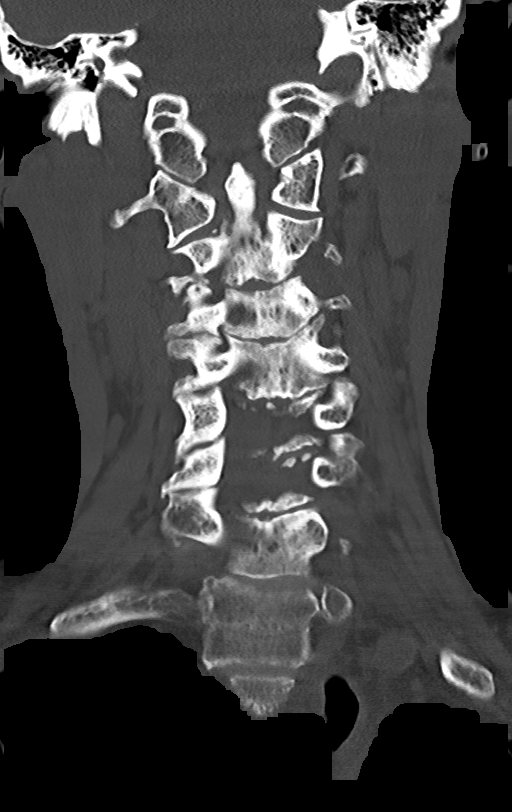

[Series 12: c_spine 2.0 orthogonals · axial · 0.21mm/px · z∈[-303,-203]mm · 3 of 109 slices shown]
[im 28/109  bone]
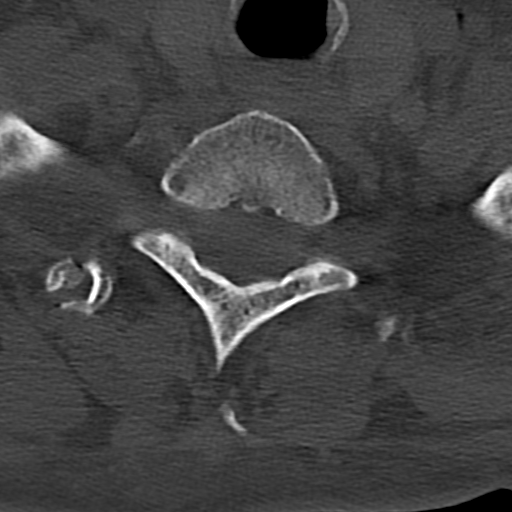
[im 55/109  bone]
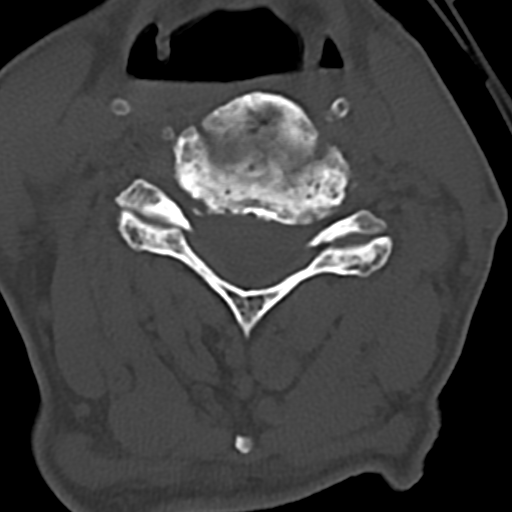
[im 82/109  bone]
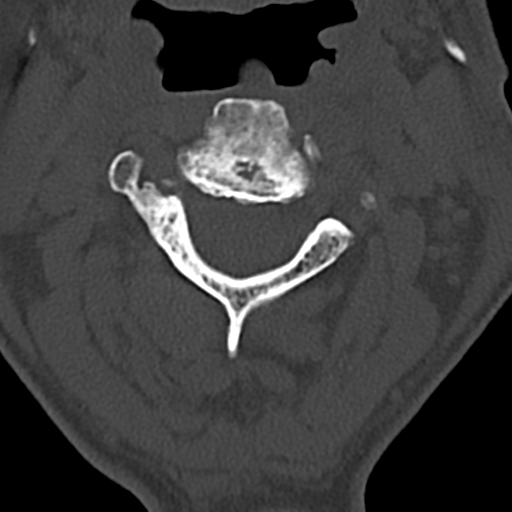

[12 of 33 positions shown; findings below may reference images not displayed]

FINDINGS: CT HEAD FINDINGS

Brain: No evidence of acute infarction, hemorrhage, hydrocephalus,
extra-axial collection or mass lesion/mass effect. Mild atrophy.

Vascular: No hyperdense vessel or unexpected calcification.

Skull: Normal. Negative for fracture or focal lesion.

Sinuses/Orbits: No acute finding.

Other: None.

CT CERVICAL SPINE FINDINGS

Alignment: Normal.

Skull base and vertebrae: No acute fracture. No primary bone lesion
or focal pathologic process.

Soft tissues and spinal canal: No prevertebral fluid or swelling. No
visible canal hematoma.

Disc levels: Narrowing of interspaces C3-C7 with posterior endplate
spurring at all levels. Uncovertebral hypertrophy results in
bilateral foraminal encroachment C3-C7.

Upper chest: Emphysematous changes in the visualized apices with
asymmetric pleuroparenchymal scarring at the left lung apex. Right
subclavian transvenous leads are partially visualized.

Other: Bilateral calcified carotid bifurcation plaque.
IMPRESSION: 1. Negative for bleed or other acute intracranial process.
2. Negative for cervical fracture or dislocation.
3. Cervical degenerative change C3-C7 as above.
4. Bilateral carotid bifurcation plaque.

## 2018-11-16 IMAGING — DX DG ABDOMEN 1V
1 series · 1 of 1 positions shown · non-contrast
Comparison: CT 07/24/2017

CLINICAL DATA: Vomiting

EXAM:
ABDOMEN - 1 VIEW

[abdomen kub]
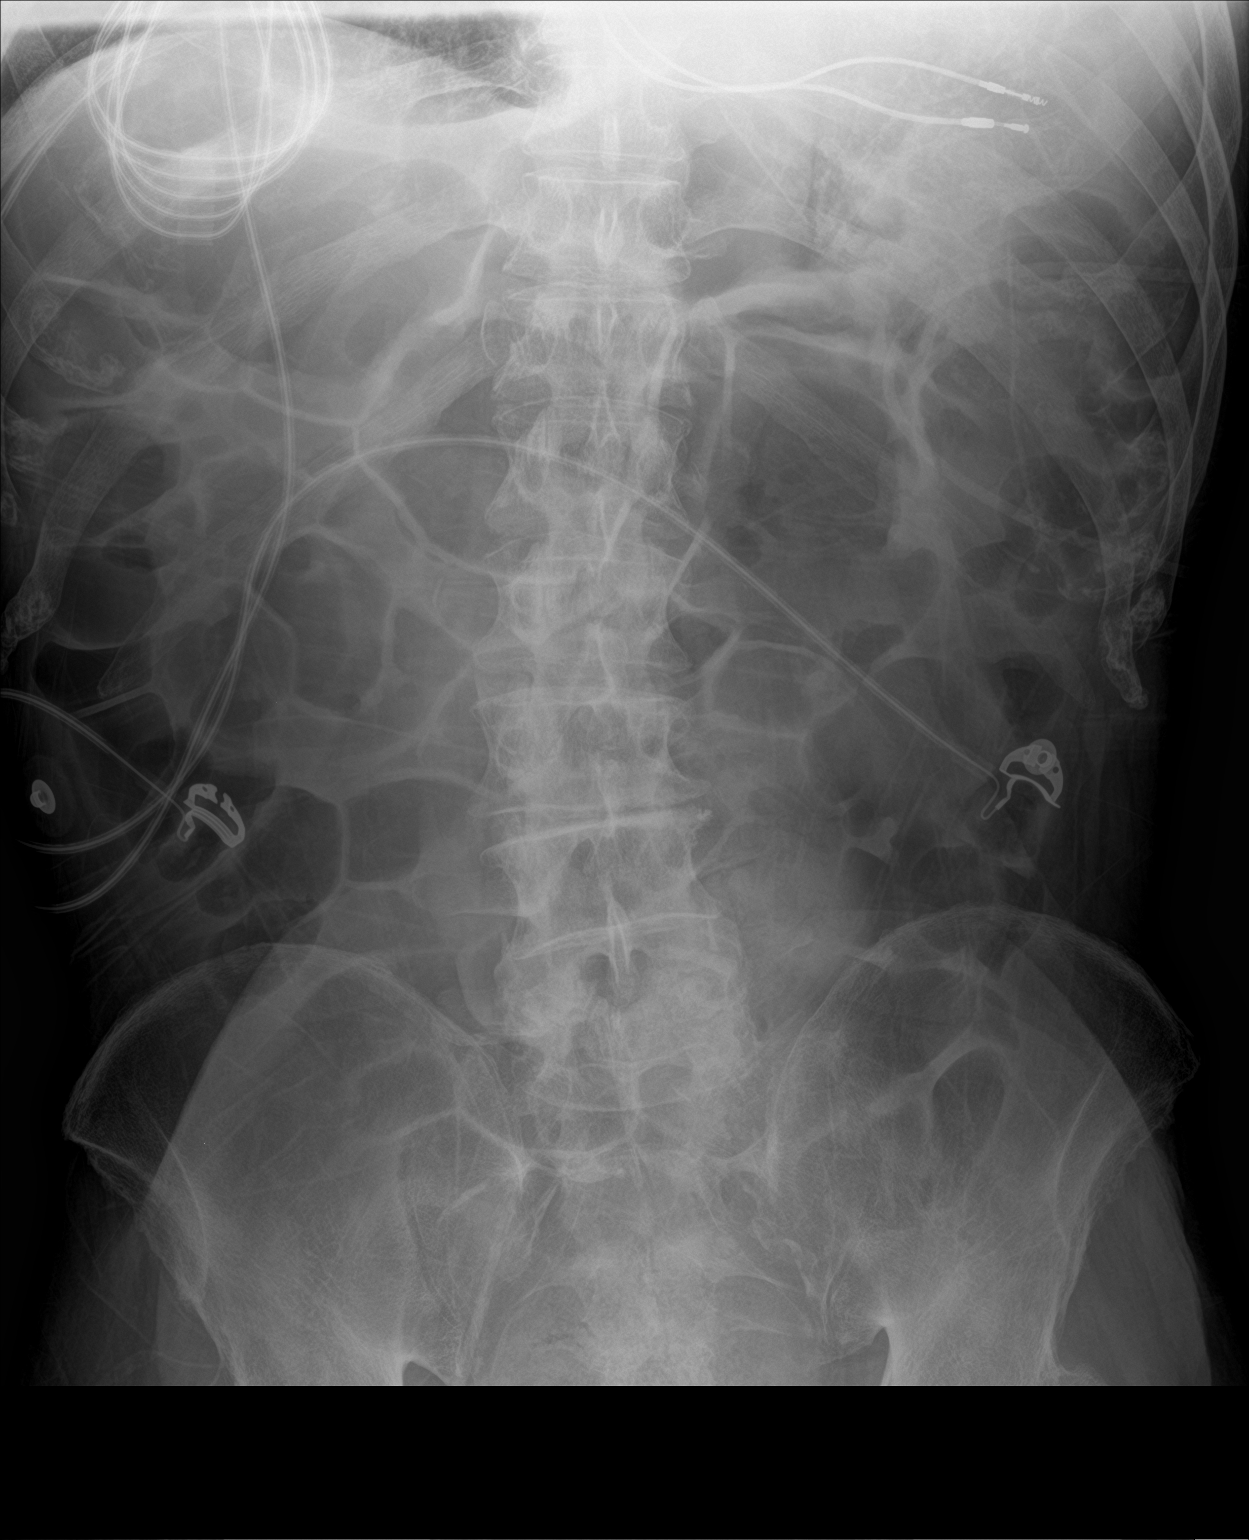

[1 of 1 positions shown; findings below may reference images not displayed]

FINDINGS: There is gas throughout small and large bowel but no focally dilated
loops to suggest obstruction. There is a large amount of fecal
matter in the rectum. No abnormal calcifications. Chronic
degenerative changes of the spine and bilateral hip replacements.
IMPRESSION: Large amount of intestinal gas within the small and large bowel but
no focally dilated loops to suggest obstruction. The patient could
have a rectal fecal impaction.

## 2019-02-10 IMAGING — DX DG CHEST 2V
3 series · 3 of 3 positions shown · non-contrast
Comparison: 08/22/2017 chest radiograph.

CLINICAL DATA: Cough

EXAM:
CHEST - 2 VIEW

[chest lat]
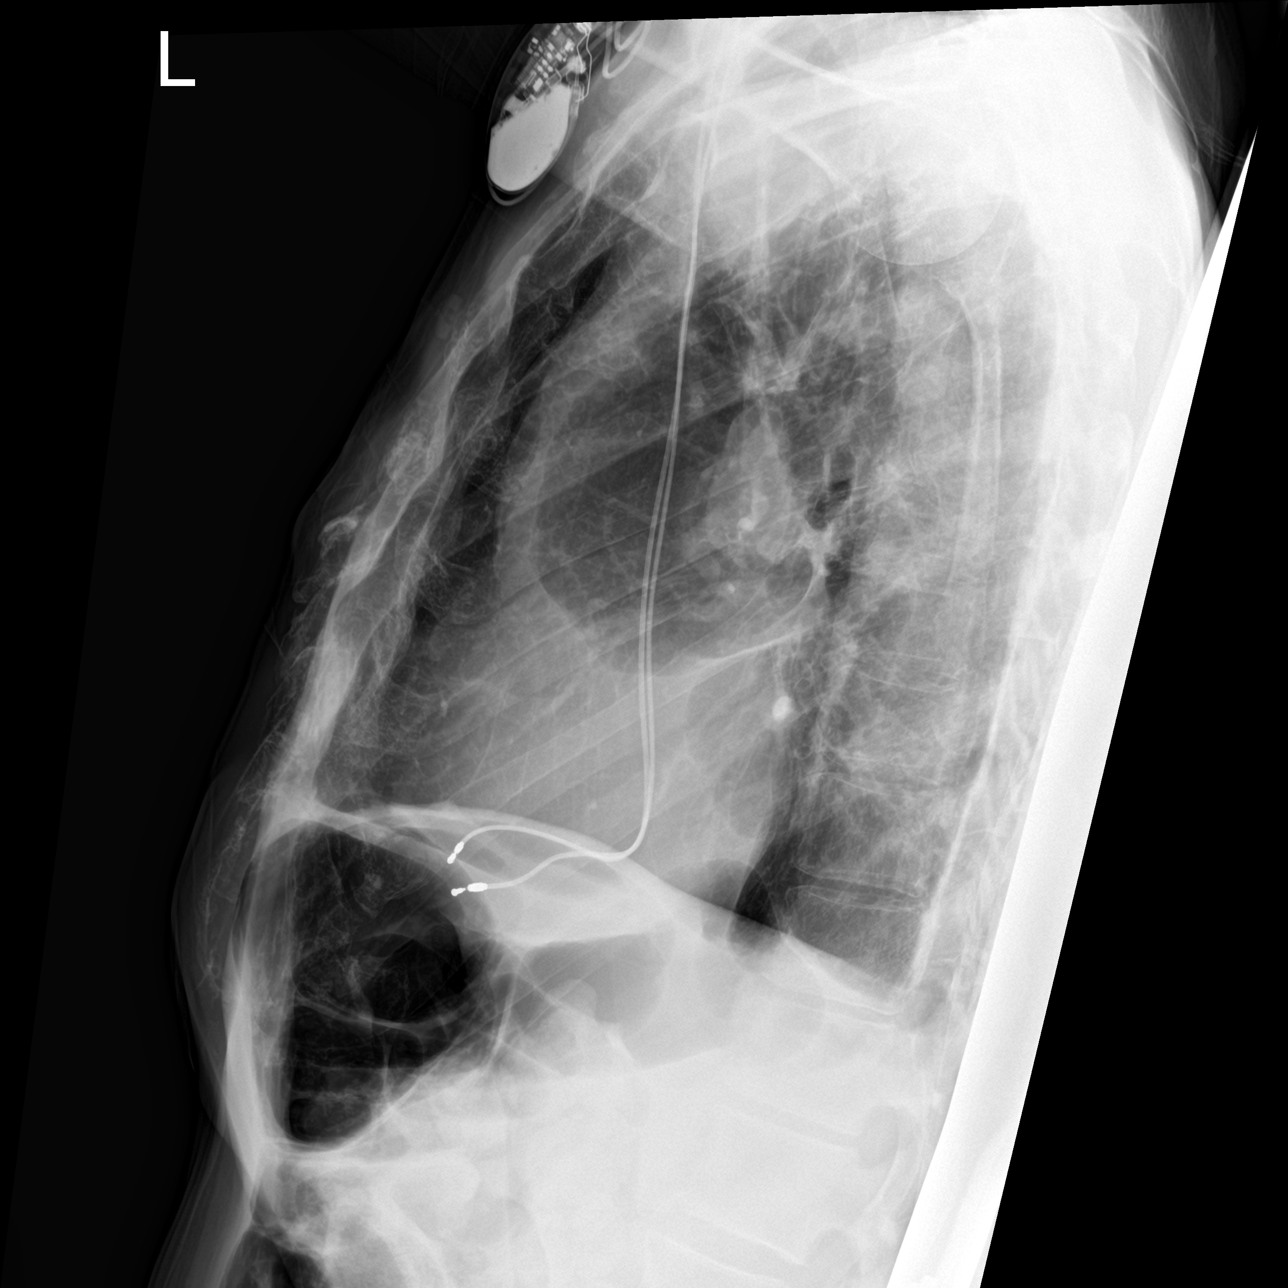

[chest ap (1 of 2)]
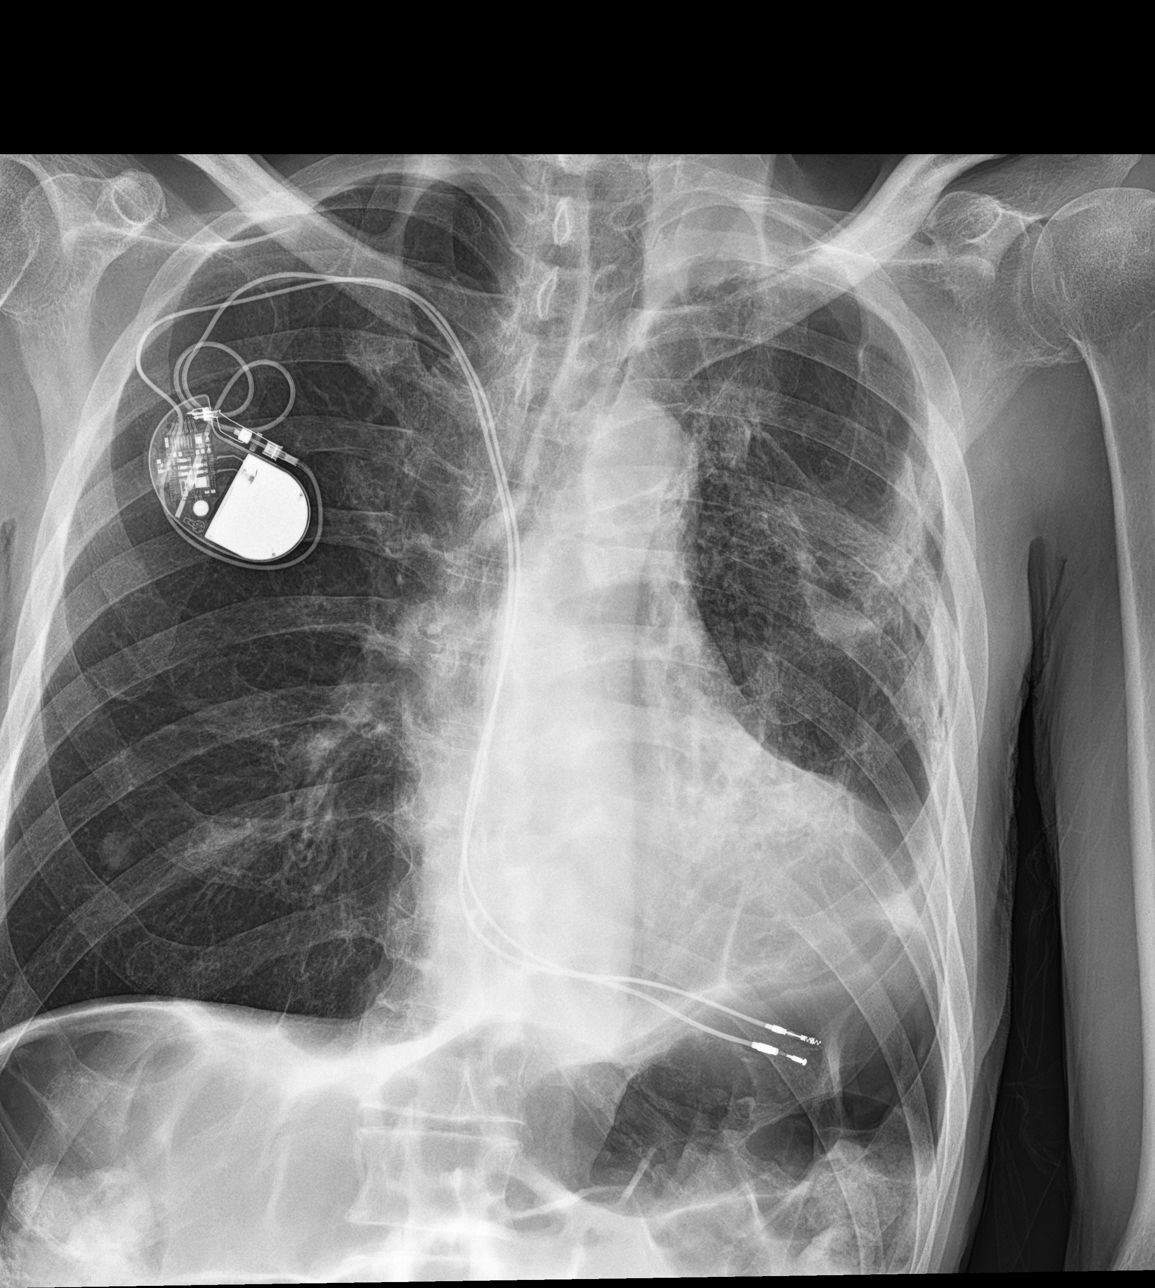

[chest ap (2 of 2)]
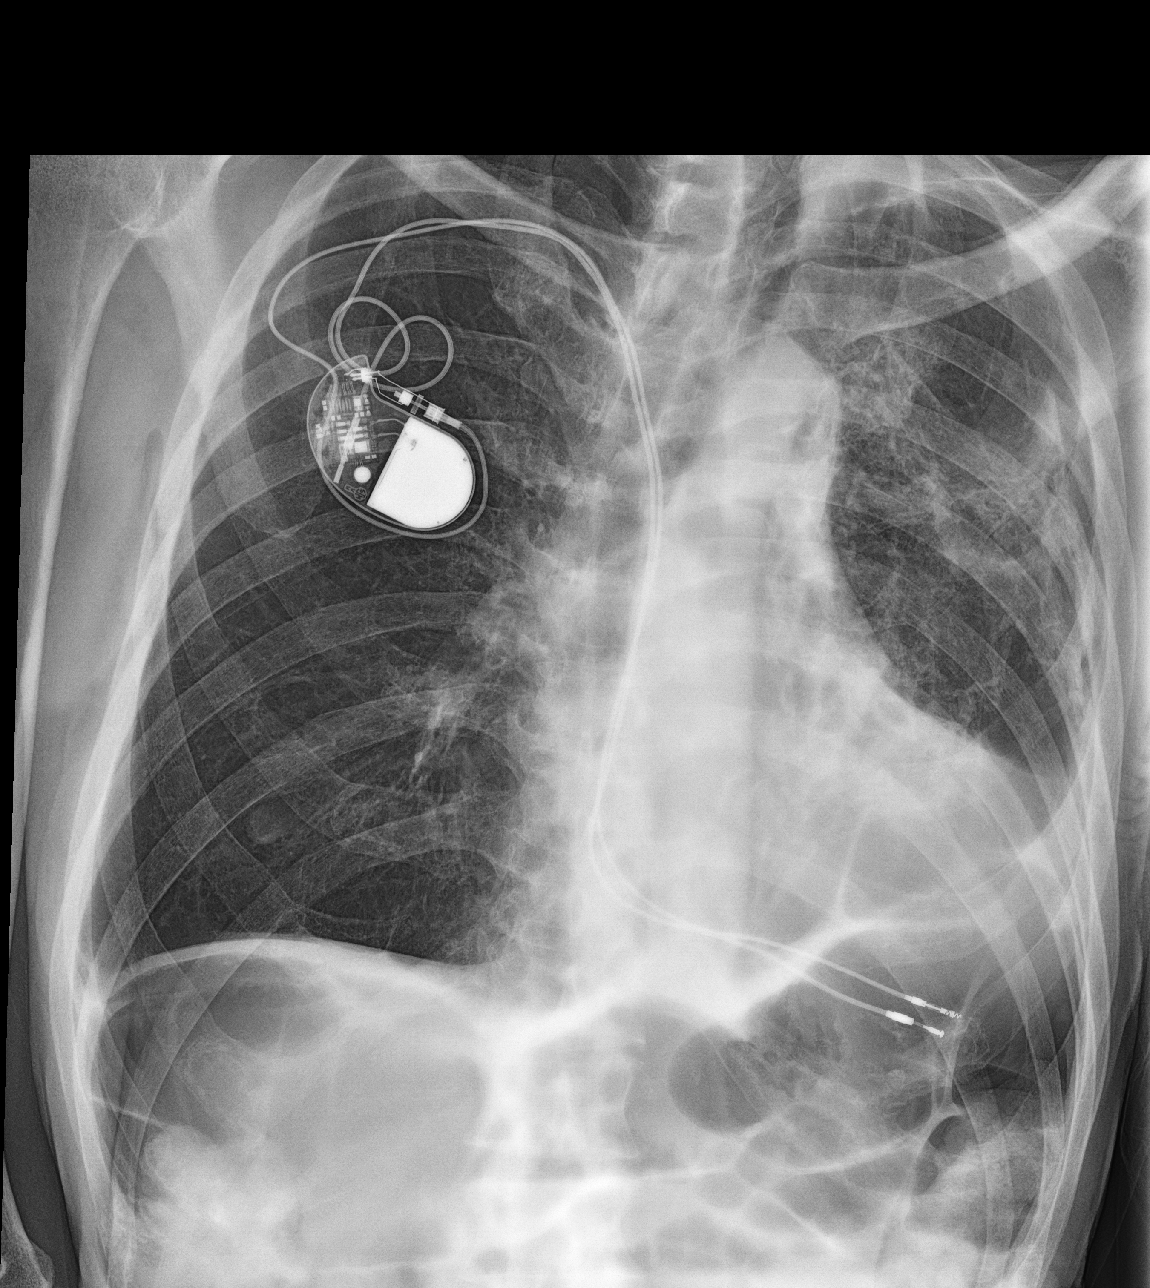

[3 of 3 positions shown; findings below may reference images not displayed]

FINDINGS: Stable configuration of 2 lead right subclavian pacemaker. Stable
cardiomediastinal silhouette with normal heart size. No
pneumothorax. Stable blunting of the costophrenic angles
bilaterally, left greater than right, compatible with
pleural-parenchymal scarring. Stable left-sided fibrothorax. Stable
scarring at the left lung base. Hyperinflated lungs. No acute
consolidative airspace disease.
IMPRESSION: 1. No acute cardiopulmonary disease.
2. Hyperinflated lungs, suggesting COPD.
3. Stable left-sided fibrothorax with pleural-parenchymal scarring
at both costophrenic angles.

## 2019-05-08 IMAGING — DX DG ABD PORTABLE 1V
1 series · 2 of 2 positions shown · non-contrast
Comparison: 08/24/2017 abdominal radiograph

CLINICAL DATA: Constipation, abdominal pain

EXAM:
PORTABLE ABDOMEN - 1 VIEW

[Series 1: abdomen · 0.14mm/px · 2 of 2 slices shown]
[im 1/2]
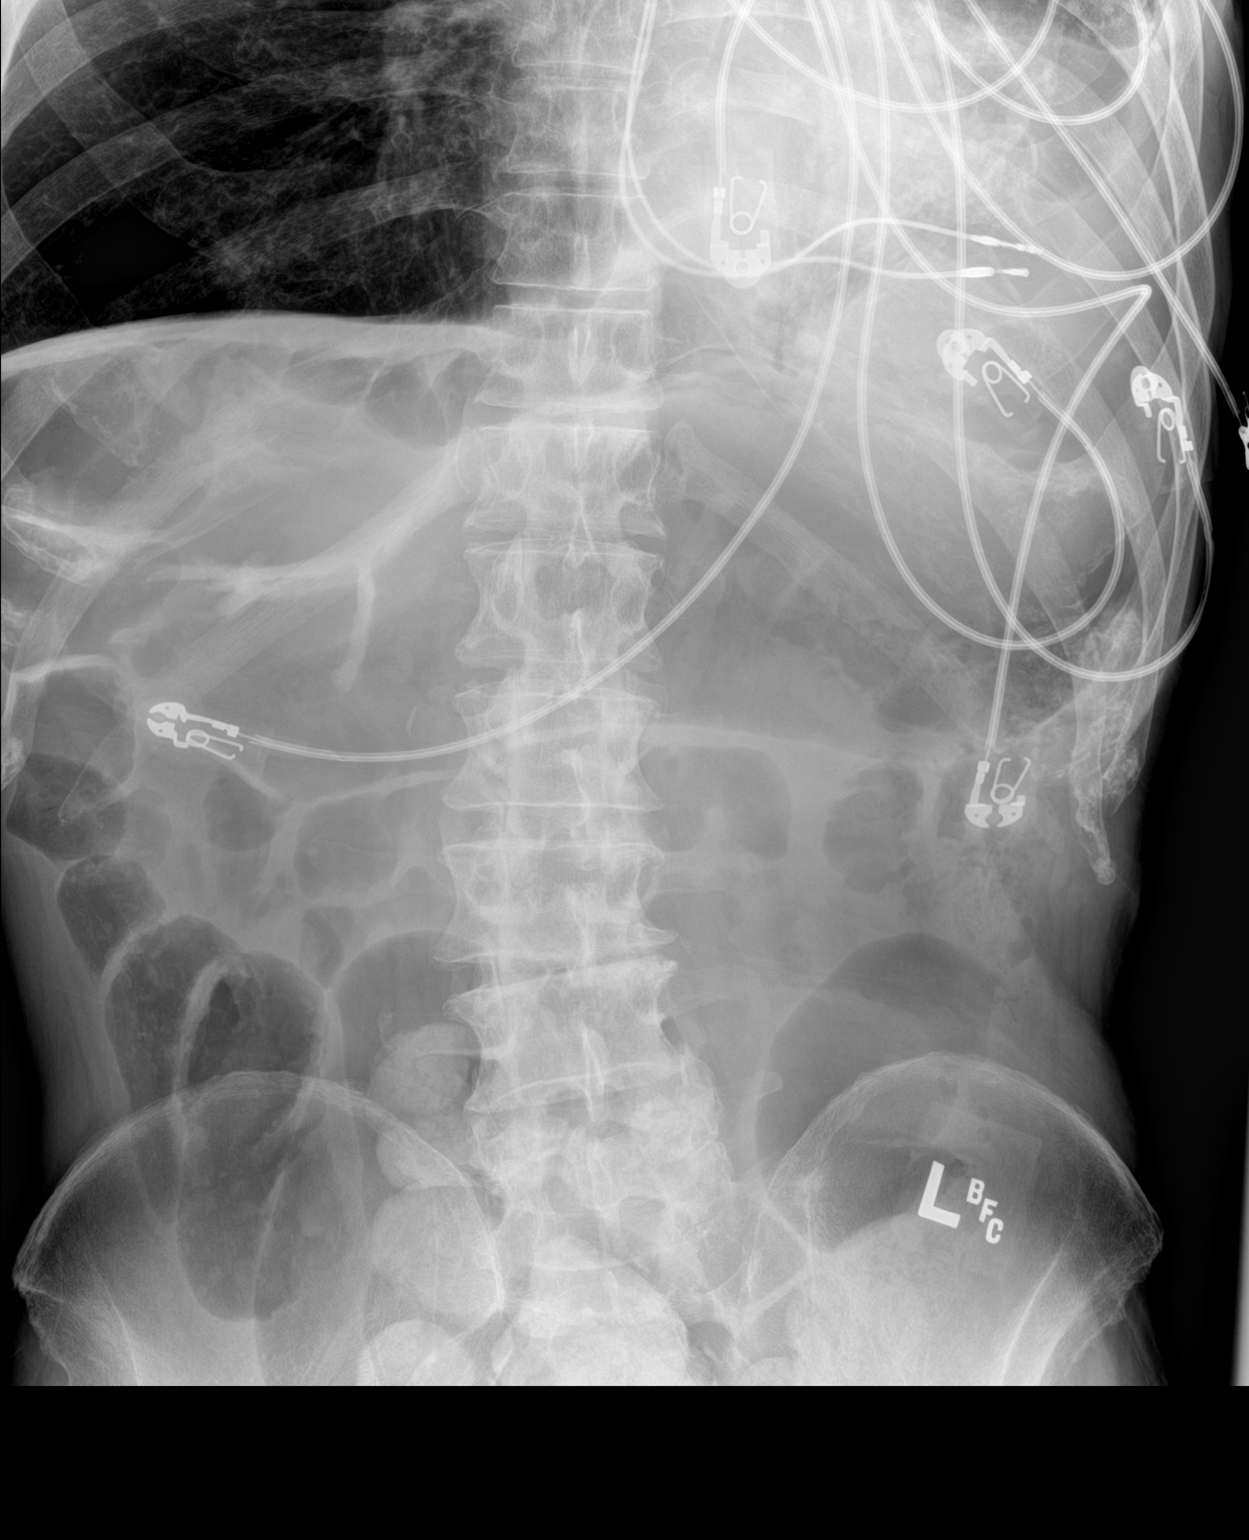
[im 2/2]
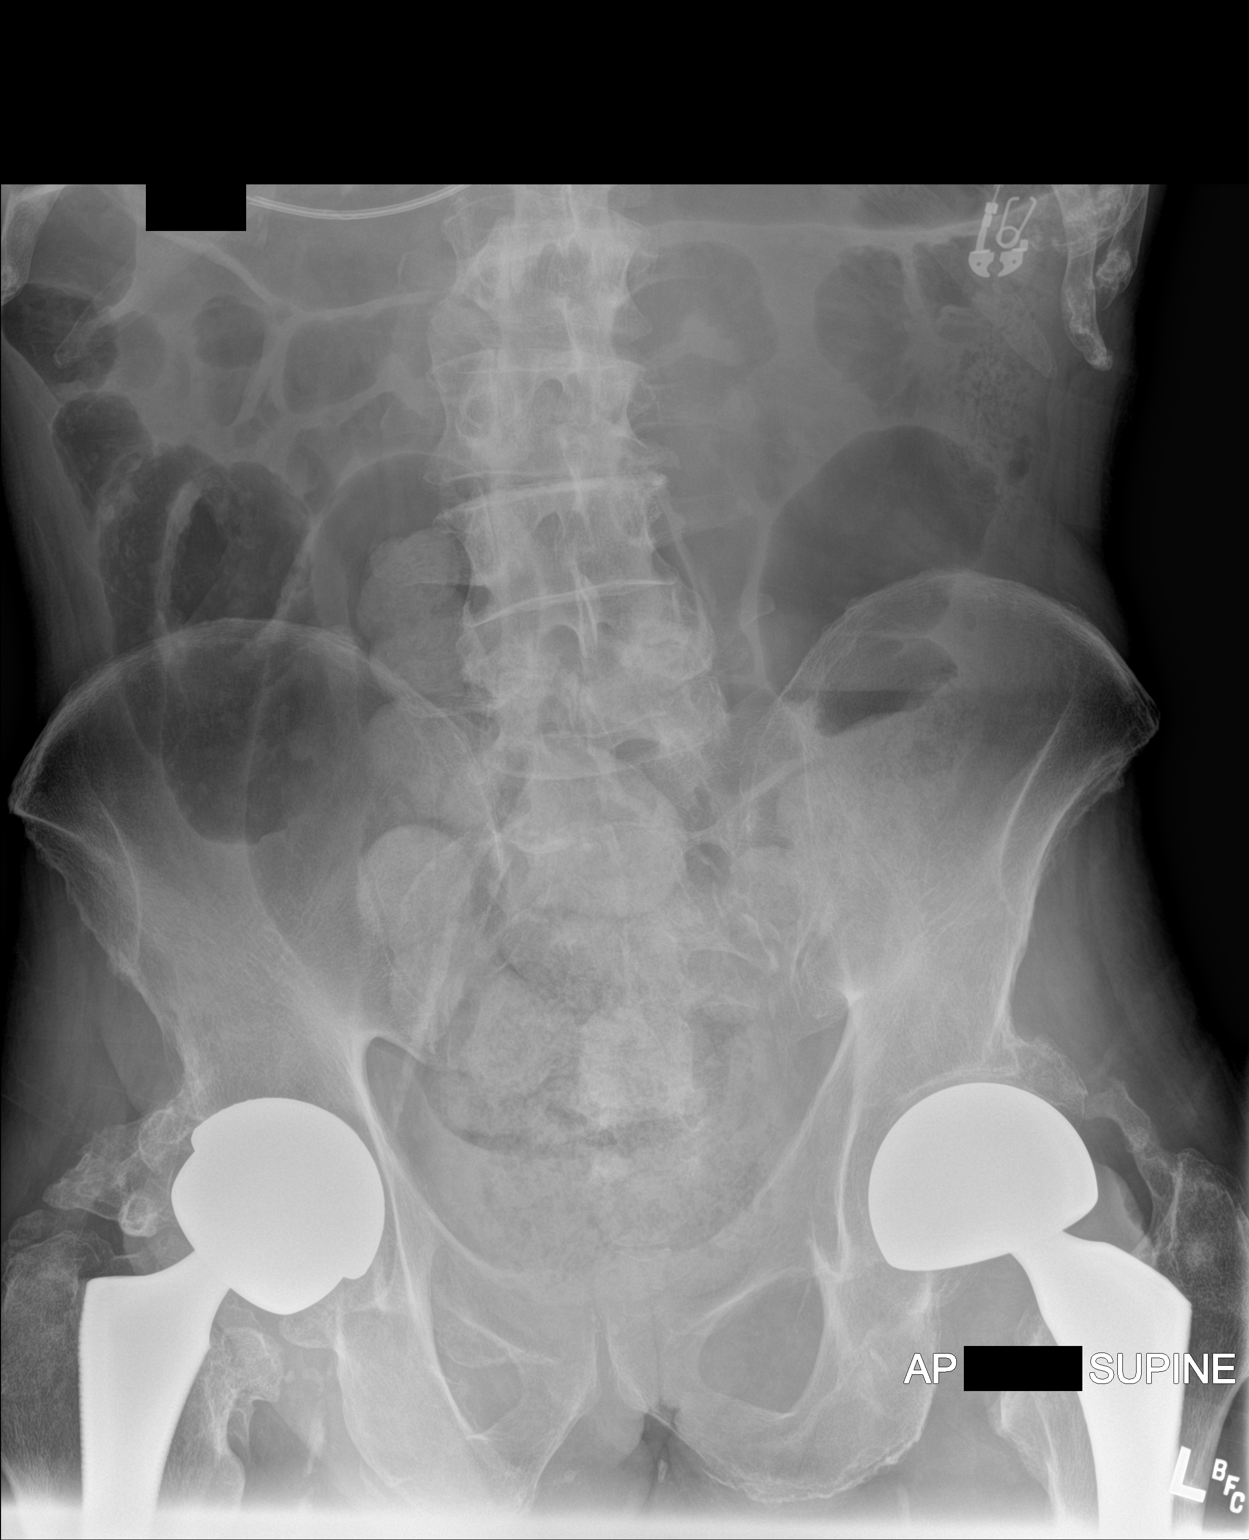

[2 of 2 positions shown; findings below may reference images not displayed]

FINDINGS: No dilated small bowel loops. Marked colonic distention with gas and
stool. No evidence of pneumatosis or pneumoperitoneum. No radiopaque
nephrolithiasis. Moderate lumbar spondylosis.
IMPRESSION: No evidence of small-bowel obstruction. Marked colonic distention
with gas and stool, similar to prior radiographs, suggesting
constipation and/or chronic colonic ileus/Ogilvie's syndrome.

## 2019-05-08 IMAGING — DX DG CHEST 1V PORT
1 series · 1 of 1 positions shown · non-contrast
Comparison: 11/18/2017 chest radiograph.

CLINICAL DATA: Abdominal pain, dyspnea

EXAM:
PORTABLE CHEST 1 VIEW

[chest]
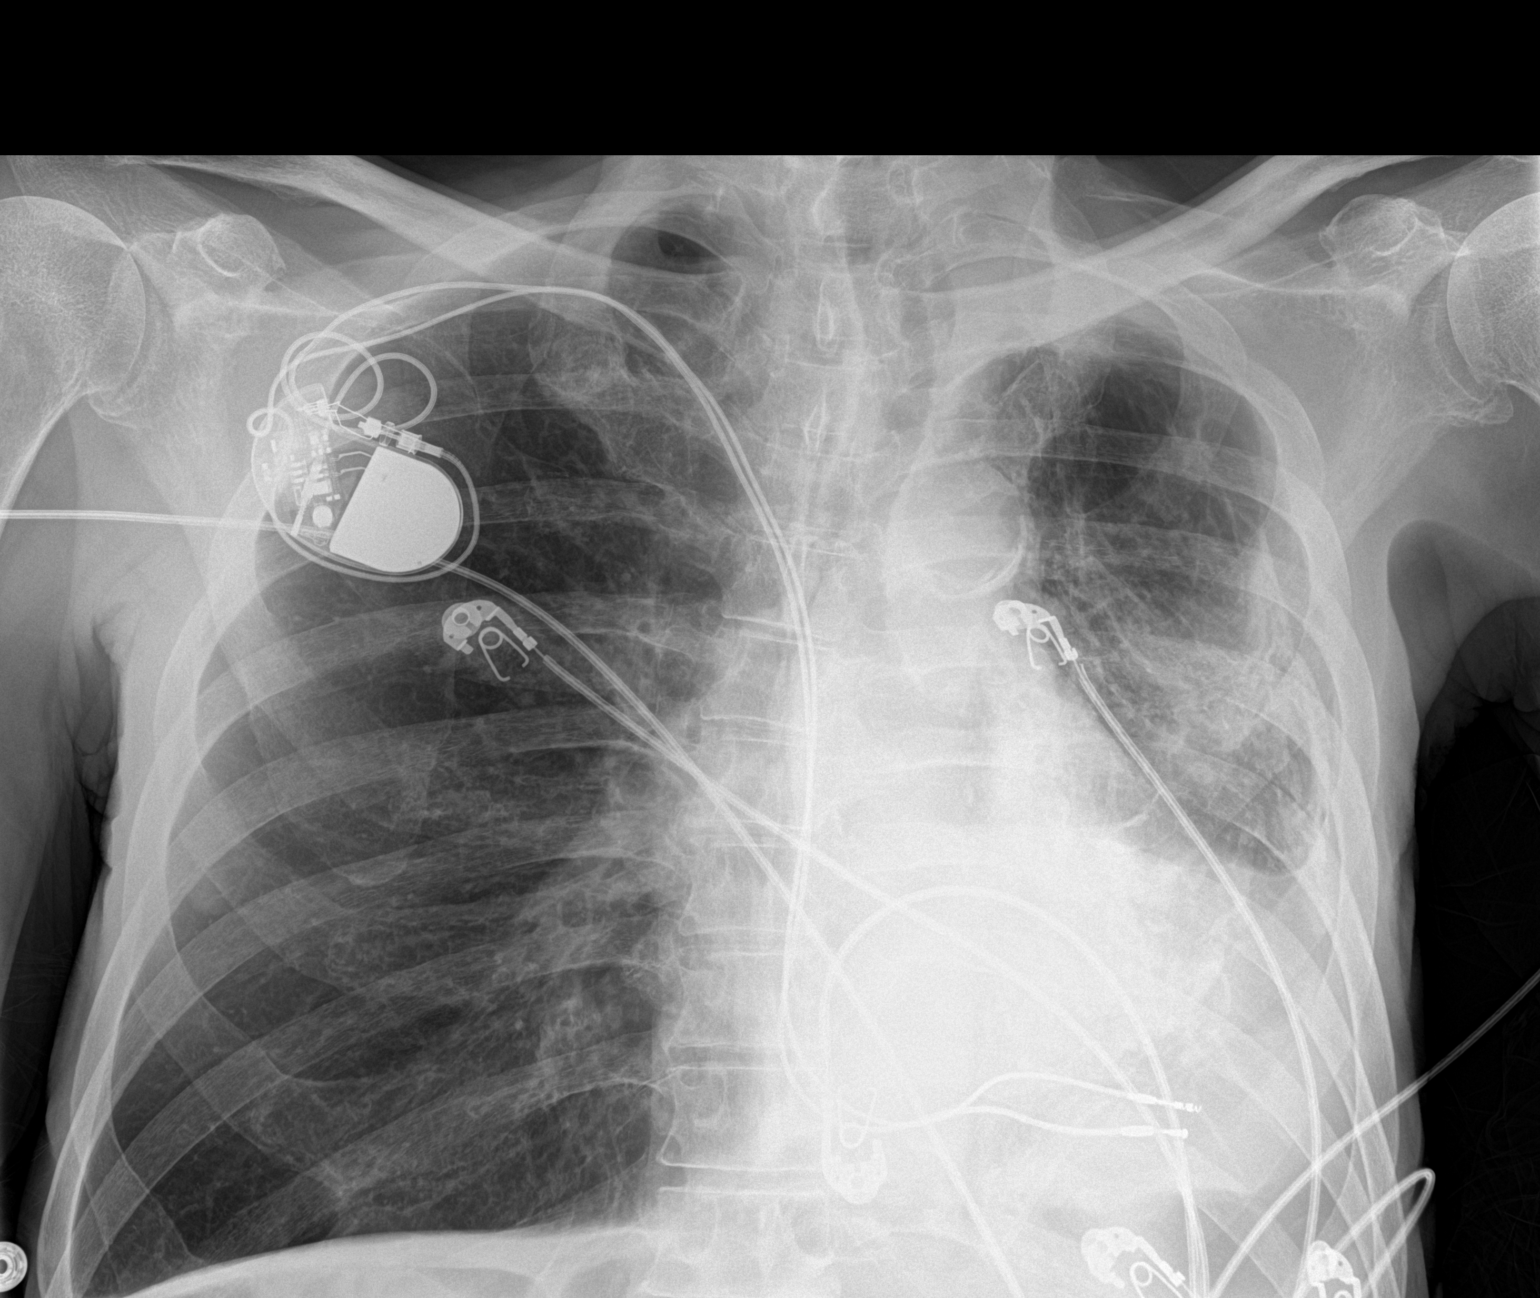

[1 of 1 positions shown; findings below may reference images not displayed]

FINDINGS: Stable configuration of 2 lead right subclavian pacemaker with lead
tips overlying the right ventricular apex. Stable cardiomediastinal
silhouette with top-normal heart size. No pneumothorax. Left
fibrothorax with volume loss in the left hemithorax and chronic
pleural-parenchymal scarring at the left costophrenic angle,
unchanged. Clear right lung. Consolidation in the left retrocardiac
region appears worsened.
IMPRESSION: 1. Left fibrothorax with left hemithorax volume loss and chronic
pleural-parenchymal scarring at the left costophrenic angle.
2. Left retrocardiac consolidation appears worsened, cannot exclude
superimposed aspiration or pneumonia. Follow-up chest radiographs
advised.

## 2019-05-09 IMAGING — DX DG CHEST 1V PORT
1 series · 1 of 1 positions shown · non-contrast
Comparison: Chest radiograph dated 02/13/2018 and abdominal
radiograph dated 02/13/2018

CLINICAL DATA: 72-year-old male with chest pain.

EXAM:
PORTABLE CHEST 1 VIEW

[chest]
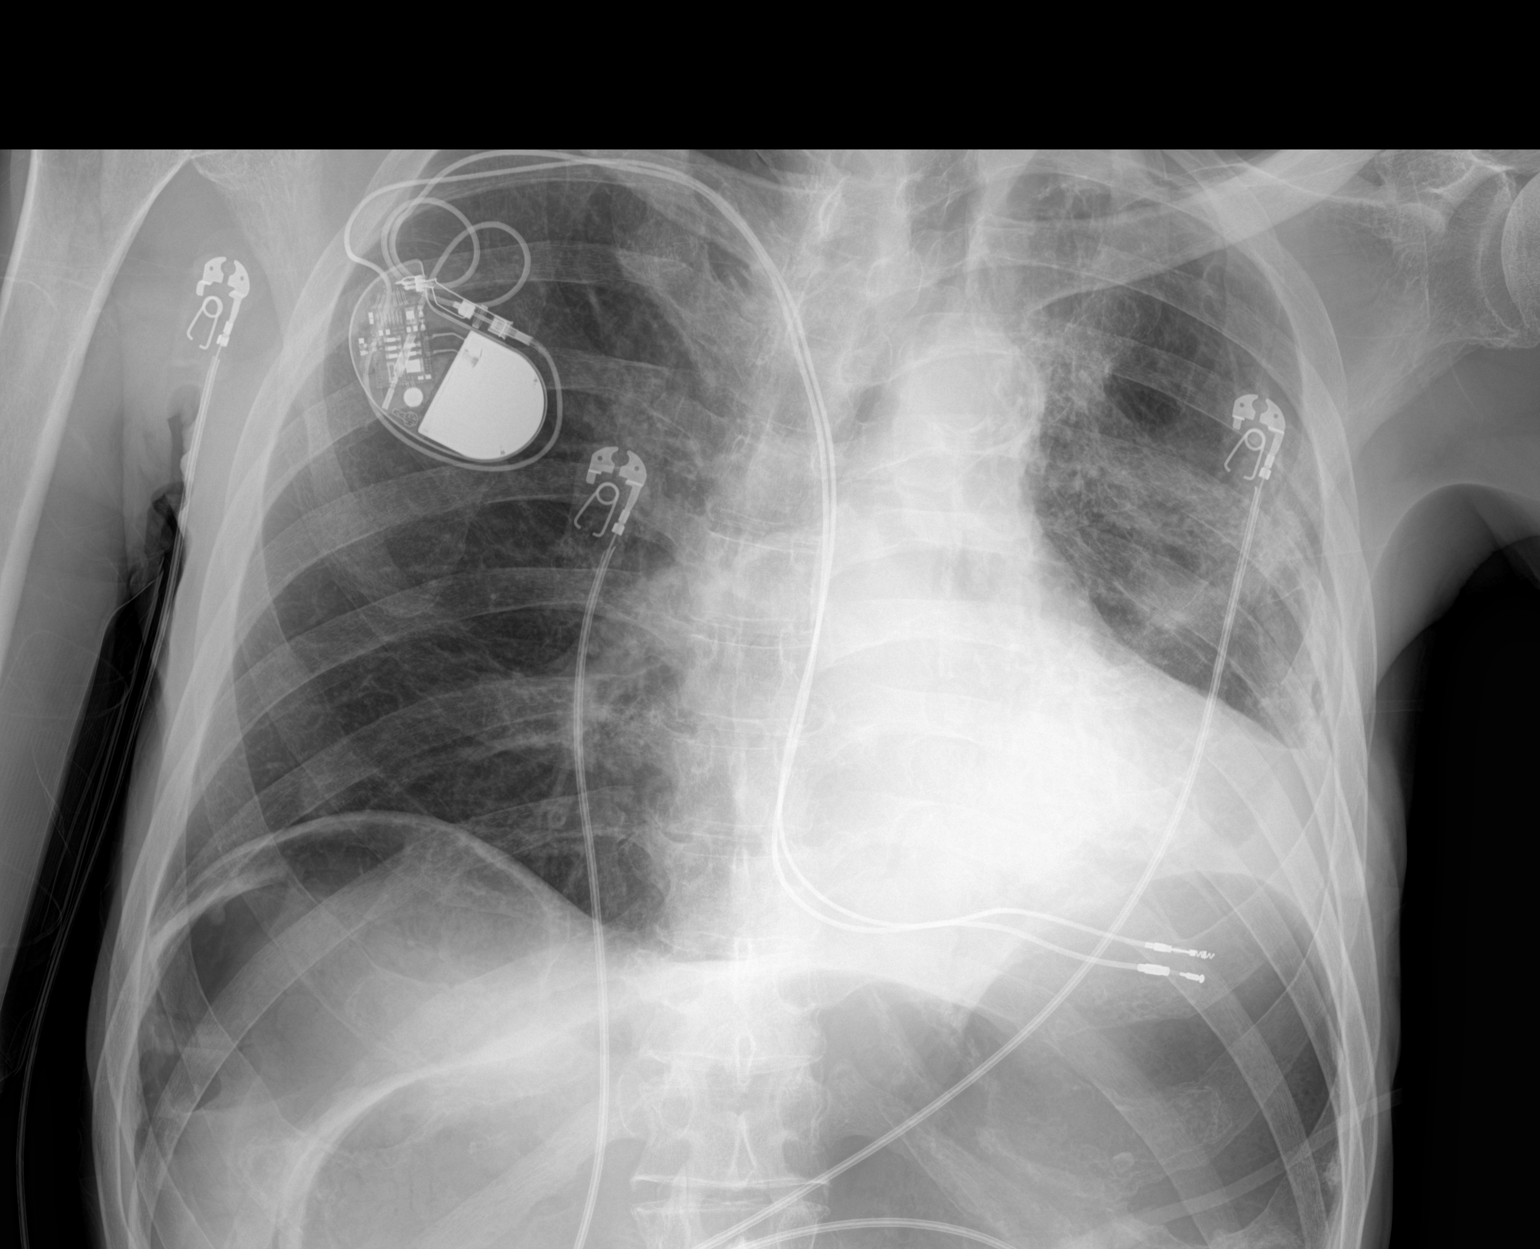

[1 of 1 positions shown; findings below may reference images not displayed]

FINDINGS: Areas of fibrosis in the left hemithorax with overall volume loss.
Left lung base/retrocardiac density may represent atelectasis or
infiltrate. The right lung is clear. Stable cardiac silhouette with
shift into the left hemithorax. Atherosclerotic calcification of the
aortic arch. Right pectoral pacemaker device. No acute osseous
pathology.

Partially visualized air distended colon in the upper abdomen.
IMPRESSION: 1. No significant interval change in the appearance of the lungs
since the earlier radiograph. Areas of fibrosis/scarring in the left
lung with left lung base density as seen previously.
2. Partially visualized air distended colon.

## 2019-05-09 IMAGING — DX DG ABDOMEN 2V
1 series · 2 of 2 positions shown · non-contrast
Comparison: Abdominal radiograph February 13, 2018.

CLINICAL DATA: Abdominal pain and shortness of breath tonight.

EXAM:
ABDOMEN - 2 VIEW

[Series 1: abdomen · 0.14mm/px · 2 of 2 slices shown]
[im 1/2]
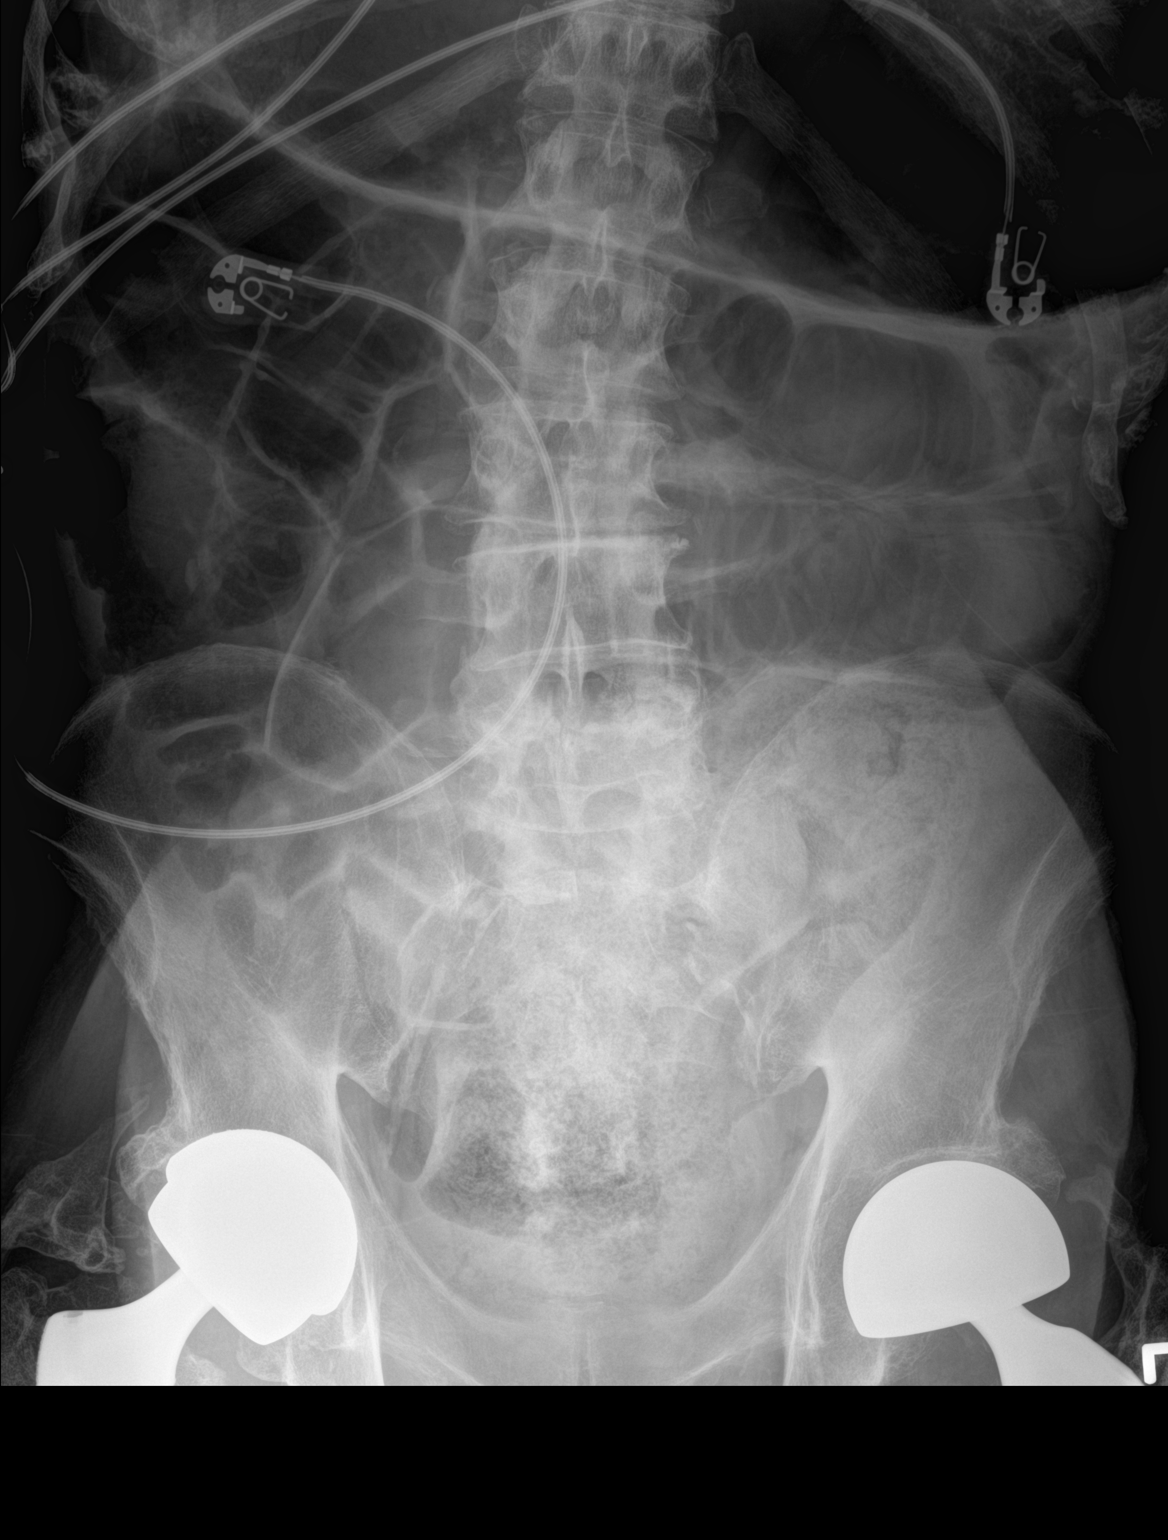
[im 2/2]
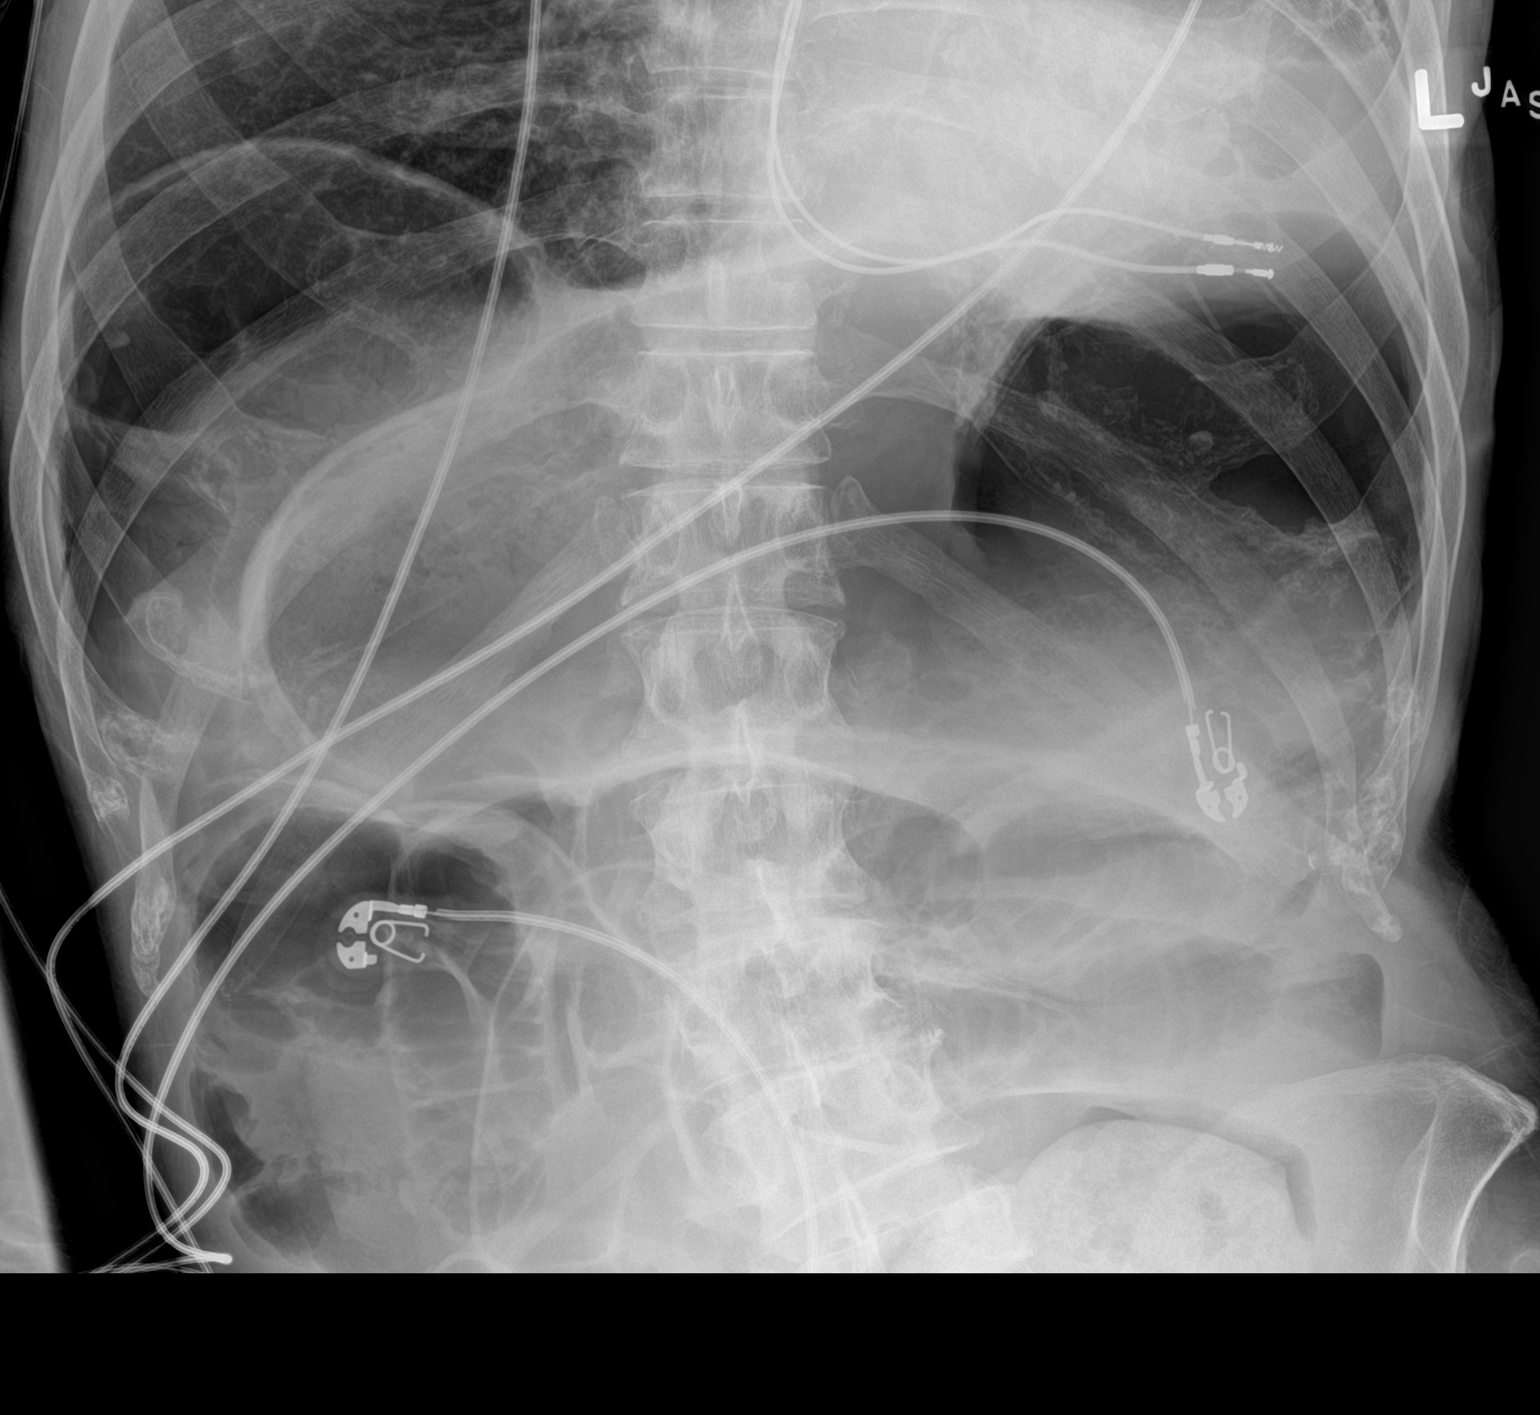

[2 of 2 positions shown; findings below may reference images not displayed]

FINDINGS: Multiple loops of gas distended small bowel measuring to 5 cm, new
from prior radiograph. Large amount of stool projecting at the
rectum. Gas distended stomach and large bowel. No intra-abdominal
mass effect or pathologic calcifications. Status post bilateral hip
arthroplasties. Chronic LEFT lung base consolidation and small
pleural effusion.
IMPRESSION: Large amount of stool projecting at rectum, suspected fecal
impaction. Worsening small and large bowel dilatation seen with
ileus or distal bowel obstruction.
